# Patient Record
Sex: Female | Born: 1965 | Race: Black or African American | Hispanic: No | Smoking: Former smoker
Health system: Southern US, Community
[De-identification: ages and names within clinical notes are randomized; demographics above are authoritative.]

## PROBLEM LIST (undated history)

## (undated) DIAGNOSIS — F329 Major depressive disorder, single episode, unspecified: Secondary | ICD-10-CM

## (undated) DIAGNOSIS — N3941 Urge incontinence: Secondary | ICD-10-CM

## (undated) DIAGNOSIS — Z8679 Personal history of other diseases of the circulatory system: Secondary | ICD-10-CM

## (undated) DIAGNOSIS — W19XXXA Unspecified fall, initial encounter: Secondary | ICD-10-CM

## (undated) DIAGNOSIS — G629 Polyneuropathy, unspecified: Secondary | ICD-10-CM

## (undated) DIAGNOSIS — G5603 Carpal tunnel syndrome, bilateral upper limbs: Secondary | ICD-10-CM

## (undated) DIAGNOSIS — F1020 Alcohol dependence, uncomplicated: Secondary | ICD-10-CM

## (undated) DIAGNOSIS — F32A Depression, unspecified: Secondary | ICD-10-CM

## (undated) DIAGNOSIS — I10 Essential (primary) hypertension: Secondary | ICD-10-CM

## (undated) DIAGNOSIS — K759 Inflammatory liver disease, unspecified: Secondary | ICD-10-CM

## (undated) DIAGNOSIS — N6019 Diffuse cystic mastopathy of unspecified breast: Secondary | ICD-10-CM

## (undated) DIAGNOSIS — Z972 Presence of dental prosthetic device (complete) (partial): Secondary | ICD-10-CM

## (undated) DIAGNOSIS — I509 Heart failure, unspecified: Secondary | ICD-10-CM

## (undated) DIAGNOSIS — R0600 Dyspnea, unspecified: Secondary | ICD-10-CM

## (undated) DIAGNOSIS — N879 Dysplasia of cervix uteri, unspecified: Secondary | ICD-10-CM

## (undated) DIAGNOSIS — E119 Type 2 diabetes mellitus without complications: Secondary | ICD-10-CM

## (undated) DIAGNOSIS — M4800 Spinal stenosis, site unspecified: Secondary | ICD-10-CM

## (undated) DIAGNOSIS — M1990 Unspecified osteoarthritis, unspecified site: Secondary | ICD-10-CM

## (undated) DIAGNOSIS — R911 Solitary pulmonary nodule: Secondary | ICD-10-CM

## (undated) DIAGNOSIS — N189 Chronic kidney disease, unspecified: Secondary | ICD-10-CM

## (undated) DIAGNOSIS — G43909 Migraine, unspecified, not intractable, without status migrainosus: Secondary | ICD-10-CM

## (undated) DIAGNOSIS — C801 Malignant (primary) neoplasm, unspecified: Secondary | ICD-10-CM

## (undated) DIAGNOSIS — F419 Anxiety disorder, unspecified: Secondary | ICD-10-CM

## (undated) DIAGNOSIS — J189 Pneumonia, unspecified organism: Secondary | ICD-10-CM

## (undated) DIAGNOSIS — F319 Bipolar disorder, unspecified: Secondary | ICD-10-CM

## (undated) DIAGNOSIS — N183 Chronic kidney disease, stage 3 unspecified: Secondary | ICD-10-CM

## (undated) DIAGNOSIS — I517 Cardiomegaly: Secondary | ICD-10-CM

## (undated) DIAGNOSIS — K219 Gastro-esophageal reflux disease without esophagitis: Secondary | ICD-10-CM

## (undated) DIAGNOSIS — F1021 Alcohol dependence, in remission: Secondary | ICD-10-CM

## (undated) DIAGNOSIS — J9611 Chronic respiratory failure with hypoxia: Secondary | ICD-10-CM

## (undated) DIAGNOSIS — F411 Generalized anxiety disorder: Secondary | ICD-10-CM

## (undated) DIAGNOSIS — L989 Disorder of the skin and subcutaneous tissue, unspecified: Secondary | ICD-10-CM

## (undated) DIAGNOSIS — M6530 Trigger finger, unspecified finger: Secondary | ICD-10-CM

## (undated) DIAGNOSIS — A630 Anogenital (venereal) warts: Secondary | ICD-10-CM

## (undated) DIAGNOSIS — J449 Chronic obstructive pulmonary disease, unspecified: Secondary | ICD-10-CM

## (undated) DIAGNOSIS — G4730 Sleep apnea, unspecified: Secondary | ICD-10-CM

## (undated) DIAGNOSIS — F1910 Other psychoactive substance abuse, uncomplicated: Secondary | ICD-10-CM

## (undated) DIAGNOSIS — M62838 Other muscle spasm: Secondary | ICD-10-CM

## (undated) DIAGNOSIS — J45909 Unspecified asthma, uncomplicated: Secondary | ICD-10-CM

## (undated) DIAGNOSIS — E785 Hyperlipidemia, unspecified: Secondary | ICD-10-CM

## (undated) DIAGNOSIS — I471 Supraventricular tachycardia, unspecified: Secondary | ICD-10-CM

## (undated) HISTORY — PX: OTHER SURGICAL HISTORY: SHX169

## (undated) HISTORY — DX: Diffuse cystic mastopathy of unspecified breast: N60.19

## (undated) HISTORY — DX: Sleep apnea, unspecified: G47.30

## (undated) HISTORY — DX: Migraine, unspecified, not intractable, without status migrainosus: G43.909

## (undated) HISTORY — DX: Essential (primary) hypertension: I10

## (undated) HISTORY — PX: BREAST CYST EXCISION: SHX579

## (undated) HISTORY — DX: Disorder of the skin and subcutaneous tissue, unspecified: L98.9

## (undated) HISTORY — PX: TRIGGER FINGER RELEASE: SHX641

## (undated) HISTORY — PX: BREAST EXCISIONAL BIOPSY: SUR124

## (undated) HISTORY — PX: FRACTURE SURGERY: SHX138

## (undated) HISTORY — DX: Hyperlipidemia, unspecified: E78.5

## (undated) HISTORY — DX: Gastro-esophageal reflux disease without esophagitis: K21.9

## (undated) HISTORY — PX: CARPAL TUNNEL RELEASE: SHX101

## (undated) HISTORY — PX: MULTIPLE TOOTH EXTRACTIONS: SHX2053

## (undated) HISTORY — DX: Chronic obstructive pulmonary disease, unspecified: J44.9

## (undated) HISTORY — DX: Alcohol dependence, uncomplicated: F10.20

## (undated) HISTORY — DX: Anogenital (venereal) warts: A63.0

---
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
## 1991-01-29 DIAGNOSIS — C801 Malignant (primary) neoplasm, unspecified: Secondary | ICD-10-CM

## 1991-01-29 DIAGNOSIS — C539 Malignant neoplasm of cervix uteri, unspecified: Secondary | ICD-10-CM

## 1991-01-29 HISTORY — DX: Malignant neoplasm of cervix uteri, unspecified: C53.9

## 1991-01-29 HISTORY — DX: Malignant (primary) neoplasm, unspecified: C80.1

## 1997-07-05 ENCOUNTER — Encounter: Admission: RE | Admit: 1997-07-05 | Discharge: 1997-07-05 | Payer: Self-pay | Admitting: Family Medicine

## 1997-07-07 ENCOUNTER — Ambulatory Visit: Admission: RE | Admit: 1997-07-07 | Discharge: 1997-07-07 | Payer: Self-pay | Admitting: Family Medicine

## 1997-07-11 ENCOUNTER — Encounter: Admission: RE | Admit: 1997-07-11 | Discharge: 1997-07-11 | Payer: Self-pay | Admitting: Family Medicine

## 1997-07-15 ENCOUNTER — Ambulatory Visit (HOSPITAL_COMMUNITY): Admission: RE | Admit: 1997-07-15 | Discharge: 1997-07-15 | Payer: Self-pay | Admitting: *Deleted

## 1997-07-20 ENCOUNTER — Encounter: Admission: RE | Admit: 1997-07-20 | Discharge: 1997-07-20 | Payer: Self-pay | Admitting: Family Medicine

## 1997-08-23 ENCOUNTER — Encounter: Admission: RE | Admit: 1997-08-23 | Discharge: 1997-08-23 | Payer: Self-pay | Admitting: Sports Medicine

## 1997-10-11 ENCOUNTER — Emergency Department (HOSPITAL_COMMUNITY): Admission: EM | Admit: 1997-10-11 | Discharge: 1997-10-11 | Payer: Self-pay | Admitting: Emergency Medicine

## 1997-10-21 ENCOUNTER — Encounter: Admission: RE | Admit: 1997-10-21 | Discharge: 1997-10-21 | Payer: Self-pay | Admitting: Family Medicine

## 1997-10-27 ENCOUNTER — Encounter: Admission: RE | Admit: 1997-10-27 | Discharge: 1997-10-27 | Payer: Self-pay | Admitting: Family Medicine

## 1997-12-20 ENCOUNTER — Ambulatory Visit (HOSPITAL_COMMUNITY): Admission: RE | Admit: 1997-12-20 | Discharge: 1997-12-20 | Payer: Self-pay | Admitting: Specialist

## 1997-12-20 ENCOUNTER — Encounter: Payer: Self-pay | Admitting: Specialist

## 1998-01-18 ENCOUNTER — Encounter: Admission: RE | Admit: 1998-01-18 | Discharge: 1998-01-18 | Payer: Self-pay | Admitting: Sports Medicine

## 1998-01-23 ENCOUNTER — Encounter: Admission: RE | Admit: 1998-01-23 | Discharge: 1998-01-23 | Payer: Self-pay | Admitting: Family Medicine

## 1998-01-24 ENCOUNTER — Encounter: Admission: RE | Admit: 1998-01-24 | Discharge: 1998-01-24 | Payer: Self-pay | Admitting: Family Medicine

## 1998-01-31 ENCOUNTER — Encounter: Admission: RE | Admit: 1998-01-31 | Discharge: 1998-01-31 | Payer: Self-pay | Admitting: Sports Medicine

## 1998-02-08 ENCOUNTER — Ambulatory Visit (HOSPITAL_COMMUNITY): Admission: RE | Admit: 1998-02-08 | Discharge: 1998-02-08 | Payer: Self-pay | Admitting: Family Medicine

## 1998-02-16 ENCOUNTER — Encounter: Admission: RE | Admit: 1998-02-16 | Discharge: 1998-02-16 | Payer: Self-pay | Admitting: Family Medicine

## 1998-02-27 ENCOUNTER — Ambulatory Visit (HOSPITAL_COMMUNITY): Admission: RE | Admit: 1998-02-27 | Discharge: 1998-02-27 | Payer: Self-pay | Admitting: *Deleted

## 1998-03-15 ENCOUNTER — Encounter: Payer: Self-pay | Admitting: Specialist

## 1998-03-15 ENCOUNTER — Ambulatory Visit (HOSPITAL_COMMUNITY): Admission: RE | Admit: 1998-03-15 | Discharge: 1998-03-15 | Payer: Self-pay | Admitting: Specialist

## 1998-04-14 ENCOUNTER — Encounter: Payer: Self-pay | Admitting: Specialist

## 1998-04-18 ENCOUNTER — Inpatient Hospital Stay (HOSPITAL_COMMUNITY): Admission: RE | Admit: 1998-04-18 | Discharge: 1998-04-24 | Payer: Self-pay | Admitting: Specialist

## 1998-04-18 ENCOUNTER — Encounter: Payer: Self-pay | Admitting: Specialist

## 1998-04-19 ENCOUNTER — Encounter: Payer: Self-pay | Admitting: Specialist

## 1998-06-30 ENCOUNTER — Other Ambulatory Visit: Admission: RE | Admit: 1998-06-30 | Discharge: 1998-06-30 | Payer: Self-pay | Admitting: Family Medicine

## 1998-06-30 ENCOUNTER — Encounter: Admission: RE | Admit: 1998-06-30 | Discharge: 1998-06-30 | Payer: Self-pay | Admitting: Family Medicine

## 1998-07-17 ENCOUNTER — Encounter: Admission: RE | Admit: 1998-07-17 | Discharge: 1998-07-17 | Payer: Self-pay | Admitting: Family Medicine

## 1998-07-31 ENCOUNTER — Encounter: Admission: RE | Admit: 1998-07-31 | Discharge: 1998-07-31 | Payer: Self-pay | Admitting: Family Medicine

## 1998-08-07 ENCOUNTER — Encounter: Admission: RE | Admit: 1998-08-07 | Discharge: 1998-08-07 | Payer: Self-pay | Admitting: Family Medicine

## 1998-08-16 ENCOUNTER — Encounter: Admission: RE | Admit: 1998-08-16 | Discharge: 1998-08-16 | Payer: Self-pay | Admitting: Family Medicine

## 1998-09-07 ENCOUNTER — Encounter: Admission: RE | Admit: 1998-09-07 | Discharge: 1998-09-07 | Payer: Self-pay | Admitting: Family Medicine

## 1998-10-06 ENCOUNTER — Ambulatory Visit (HOSPITAL_COMMUNITY): Admission: RE | Admit: 1998-10-06 | Discharge: 1998-10-06 | Payer: Self-pay | Admitting: Specialist

## 1998-10-06 ENCOUNTER — Encounter: Payer: Self-pay | Admitting: Specialist

## 1998-10-10 ENCOUNTER — Encounter: Admission: RE | Admit: 1998-10-10 | Discharge: 1998-10-10 | Payer: Self-pay | Admitting: Family Medicine

## 1998-11-08 ENCOUNTER — Encounter: Admission: RE | Admit: 1998-11-08 | Discharge: 1998-11-08 | Payer: Self-pay | Admitting: Family Medicine

## 1999-01-02 ENCOUNTER — Encounter: Admission: RE | Admit: 1999-01-02 | Discharge: 1999-01-02 | Payer: Self-pay | Admitting: Family Medicine

## 1999-01-18 ENCOUNTER — Encounter: Admission: RE | Admit: 1999-01-18 | Discharge: 1999-01-18 | Payer: Self-pay | Admitting: Family Medicine

## 1999-02-07 ENCOUNTER — Encounter: Admission: RE | Admit: 1999-02-07 | Discharge: 1999-02-07 | Payer: Self-pay | Admitting: Family Medicine

## 1999-02-09 ENCOUNTER — Encounter: Admission: RE | Admit: 1999-02-09 | Discharge: 1999-02-09 | Payer: Self-pay | Admitting: Family Medicine

## 1999-02-20 ENCOUNTER — Encounter: Admission: RE | Admit: 1999-02-20 | Discharge: 1999-05-21 | Payer: Self-pay | Admitting: *Deleted

## 1999-05-08 ENCOUNTER — Encounter: Admission: RE | Admit: 1999-05-08 | Discharge: 1999-05-08 | Payer: Self-pay | Admitting: Family Medicine

## 1999-06-13 ENCOUNTER — Encounter: Payer: Self-pay | Admitting: Specialist

## 1999-06-13 ENCOUNTER — Ambulatory Visit (HOSPITAL_COMMUNITY): Admission: RE | Admit: 1999-06-13 | Discharge: 1999-06-13 | Payer: Self-pay | Admitting: Specialist

## 1999-07-04 ENCOUNTER — Other Ambulatory Visit: Admission: RE | Admit: 1999-07-04 | Discharge: 1999-07-04 | Payer: Self-pay | Admitting: Family Medicine

## 1999-07-04 ENCOUNTER — Encounter: Admission: RE | Admit: 1999-07-04 | Discharge: 1999-07-04 | Payer: Self-pay | Admitting: Family Medicine

## 1999-10-08 ENCOUNTER — Encounter: Admission: RE | Admit: 1999-10-08 | Discharge: 1999-10-08 | Payer: Self-pay | Admitting: Family Medicine

## 1999-10-15 ENCOUNTER — Encounter: Admission: RE | Admit: 1999-10-15 | Discharge: 1999-10-15 | Payer: Self-pay | Admitting: Family Medicine

## 1999-11-09 ENCOUNTER — Encounter: Admission: RE | Admit: 1999-11-09 | Discharge: 1999-11-09 | Payer: Self-pay | Admitting: Family Medicine

## 1999-11-12 ENCOUNTER — Encounter: Admission: RE | Admit: 1999-11-12 | Discharge: 1999-11-12 | Payer: Self-pay | Admitting: Family Medicine

## 1999-11-15 ENCOUNTER — Encounter: Admission: RE | Admit: 1999-11-15 | Discharge: 1999-11-15 | Payer: Self-pay | Admitting: *Deleted

## 1999-11-15 ENCOUNTER — Encounter: Payer: Self-pay | Admitting: *Deleted

## 2000-01-07 ENCOUNTER — Encounter: Admission: RE | Admit: 2000-01-07 | Discharge: 2000-01-07 | Payer: Self-pay | Admitting: Family Medicine

## 2000-01-11 ENCOUNTER — Encounter: Admission: RE | Admit: 2000-01-11 | Discharge: 2000-01-11 | Payer: Self-pay | Admitting: Family Medicine

## 2000-01-14 ENCOUNTER — Encounter: Admission: RE | Admit: 2000-01-14 | Discharge: 2000-01-14 | Payer: Self-pay | Admitting: Family Medicine

## 2000-01-15 ENCOUNTER — Encounter: Admission: RE | Admit: 2000-01-15 | Discharge: 2000-01-15 | Payer: Self-pay | Admitting: *Deleted

## 2000-01-15 ENCOUNTER — Encounter: Payer: Self-pay | Admitting: *Deleted

## 2000-01-23 ENCOUNTER — Encounter: Admission: RE | Admit: 2000-01-23 | Discharge: 2000-01-23 | Payer: Self-pay | Admitting: *Deleted

## 2000-01-23 ENCOUNTER — Encounter: Admission: RE | Admit: 2000-01-23 | Discharge: 2000-01-23 | Payer: Self-pay | Admitting: Internal Medicine

## 2000-01-23 ENCOUNTER — Encounter: Payer: Self-pay | Admitting: *Deleted

## 2000-02-06 ENCOUNTER — Encounter: Payer: Self-pay | Admitting: *Deleted

## 2000-02-06 ENCOUNTER — Encounter: Admission: RE | Admit: 2000-02-06 | Discharge: 2000-02-06 | Payer: Self-pay | Admitting: *Deleted

## 2000-03-13 ENCOUNTER — Encounter: Payer: Self-pay | Admitting: Specialist

## 2000-03-13 ENCOUNTER — Encounter: Admission: RE | Admit: 2000-03-13 | Discharge: 2000-03-13 | Payer: Self-pay | Admitting: Specialist

## 2000-05-14 ENCOUNTER — Encounter: Admission: RE | Admit: 2000-05-14 | Discharge: 2000-05-14 | Payer: Self-pay | Admitting: Family Medicine

## 2000-06-02 ENCOUNTER — Encounter: Admission: RE | Admit: 2000-06-02 | Discharge: 2000-06-02 | Payer: Self-pay | Admitting: Family Medicine

## 2000-06-04 ENCOUNTER — Encounter: Admission: RE | Admit: 2000-06-04 | Discharge: 2000-06-04 | Payer: Self-pay | Admitting: Family Medicine

## 2000-06-05 ENCOUNTER — Encounter: Payer: Self-pay | Admitting: Sports Medicine

## 2000-06-05 ENCOUNTER — Encounter: Admission: RE | Admit: 2000-06-05 | Discharge: 2000-06-05 | Payer: Self-pay | Admitting: Sports Medicine

## 2000-06-06 ENCOUNTER — Encounter: Admission: RE | Admit: 2000-06-06 | Discharge: 2000-06-06 | Payer: Self-pay | Admitting: Family Medicine

## 2000-06-09 ENCOUNTER — Encounter: Admission: RE | Admit: 2000-06-09 | Discharge: 2000-06-09 | Payer: Self-pay | Admitting: *Deleted

## 2000-06-09 ENCOUNTER — Encounter: Payer: Self-pay | Admitting: *Deleted

## 2000-06-10 ENCOUNTER — Encounter: Admission: RE | Admit: 2000-06-10 | Discharge: 2000-06-10 | Payer: Self-pay | Admitting: Family Medicine

## 2000-06-16 ENCOUNTER — Encounter: Payer: Self-pay | Admitting: Specialist

## 2000-06-16 ENCOUNTER — Encounter: Admission: RE | Admit: 2000-06-16 | Discharge: 2000-06-16 | Payer: Self-pay | Admitting: Specialist

## 2000-06-23 ENCOUNTER — Encounter: Admission: RE | Admit: 2000-06-23 | Discharge: 2000-06-23 | Payer: Self-pay | Admitting: *Deleted

## 2000-06-23 ENCOUNTER — Encounter: Payer: Self-pay | Admitting: *Deleted

## 2000-06-24 ENCOUNTER — Encounter: Admission: RE | Admit: 2000-06-24 | Discharge: 2000-06-24 | Payer: Self-pay | Admitting: Sports Medicine

## 2000-07-16 ENCOUNTER — Encounter: Admission: RE | Admit: 2000-07-16 | Discharge: 2000-07-16 | Payer: Self-pay | Admitting: Family Medicine

## 2000-07-24 ENCOUNTER — Encounter: Admission: RE | Admit: 2000-07-24 | Discharge: 2000-07-24 | Payer: Self-pay | Admitting: Family Medicine

## 2000-08-20 ENCOUNTER — Encounter: Admission: RE | Admit: 2000-08-20 | Discharge: 2000-08-20 | Payer: Self-pay | Admitting: Family Medicine

## 2000-11-19 ENCOUNTER — Encounter: Admission: RE | Admit: 2000-11-19 | Discharge: 2000-11-19 | Payer: Self-pay | Admitting: Family Medicine

## 2000-12-08 ENCOUNTER — Encounter: Admission: RE | Admit: 2000-12-08 | Discharge: 2000-12-08 | Payer: Self-pay | Admitting: Family Medicine

## 2000-12-10 ENCOUNTER — Encounter: Admission: RE | Admit: 2000-12-10 | Discharge: 2000-12-10 | Payer: Self-pay | Admitting: *Deleted

## 2000-12-26 ENCOUNTER — Encounter: Payer: Self-pay | Admitting: General Surgery

## 2000-12-26 ENCOUNTER — Encounter: Admission: RE | Admit: 2000-12-26 | Discharge: 2000-12-26 | Payer: Self-pay | Admitting: General Surgery

## 2000-12-29 ENCOUNTER — Encounter (INDEPENDENT_AMBULATORY_CARE_PROVIDER_SITE_OTHER): Payer: Self-pay | Admitting: *Deleted

## 2000-12-29 ENCOUNTER — Ambulatory Visit (HOSPITAL_BASED_OUTPATIENT_CLINIC_OR_DEPARTMENT_OTHER): Admission: RE | Admit: 2000-12-29 | Discharge: 2000-12-29 | Payer: Self-pay | Admitting: Orthopaedic Surgery

## 2001-02-18 ENCOUNTER — Encounter: Admission: RE | Admit: 2001-02-18 | Discharge: 2001-02-18 | Payer: Self-pay | Admitting: Family Medicine

## 2001-02-20 ENCOUNTER — Encounter: Payer: Self-pay | Admitting: *Deleted

## 2001-02-20 ENCOUNTER — Encounter: Admission: RE | Admit: 2001-02-20 | Discharge: 2001-02-20 | Payer: Self-pay | Admitting: *Deleted

## 2001-02-26 ENCOUNTER — Encounter: Admission: RE | Admit: 2001-02-26 | Discharge: 2001-02-26 | Payer: Self-pay | Admitting: Family Medicine

## 2001-05-19 ENCOUNTER — Encounter: Admission: RE | Admit: 2001-05-19 | Discharge: 2001-05-19 | Payer: Self-pay | Admitting: Family Medicine

## 2001-05-26 ENCOUNTER — Encounter: Admission: RE | Admit: 2001-05-26 | Discharge: 2001-05-26 | Payer: Self-pay | Admitting: Pediatrics

## 2001-06-11 ENCOUNTER — Encounter: Admission: RE | Admit: 2001-06-11 | Discharge: 2001-06-11 | Payer: Self-pay | Admitting: Family Medicine

## 2001-06-23 ENCOUNTER — Encounter: Admission: RE | Admit: 2001-06-23 | Discharge: 2001-06-23 | Payer: Self-pay | Admitting: Family Medicine

## 2001-06-25 ENCOUNTER — Encounter: Admission: RE | Admit: 2001-06-25 | Discharge: 2001-06-25 | Payer: Self-pay | Admitting: Family Medicine

## 2001-07-08 ENCOUNTER — Encounter: Admission: RE | Admit: 2001-07-08 | Discharge: 2001-07-08 | Payer: Self-pay | Admitting: Family Medicine

## 2001-07-08 ENCOUNTER — Other Ambulatory Visit: Admission: RE | Admit: 2001-07-08 | Discharge: 2001-07-08 | Payer: Self-pay | Admitting: Family Medicine

## 2001-07-13 ENCOUNTER — Ambulatory Visit (HOSPITAL_COMMUNITY): Admission: RE | Admit: 2001-07-13 | Discharge: 2001-07-13 | Payer: Self-pay | Admitting: *Deleted

## 2001-07-20 ENCOUNTER — Encounter: Admission: RE | Admit: 2001-07-20 | Discharge: 2001-07-20 | Payer: Self-pay | Admitting: Family Medicine

## 2001-09-10 ENCOUNTER — Encounter: Admission: RE | Admit: 2001-09-10 | Discharge: 2001-09-10 | Payer: Self-pay | Admitting: Family Medicine

## 2001-09-17 ENCOUNTER — Encounter: Admission: RE | Admit: 2001-09-17 | Discharge: 2001-09-17 | Payer: Self-pay | Admitting: Family Medicine

## 2001-10-19 ENCOUNTER — Encounter: Admission: RE | Admit: 2001-10-19 | Discharge: 2001-10-19 | Payer: Self-pay | Admitting: Family Medicine

## 2001-10-20 ENCOUNTER — Inpatient Hospital Stay (HOSPITAL_COMMUNITY): Admission: EM | Admit: 2001-10-20 | Discharge: 2001-10-24 | Payer: Self-pay | Admitting: Psychiatry

## 2001-11-04 ENCOUNTER — Encounter: Admission: RE | Admit: 2001-11-04 | Discharge: 2001-11-04 | Payer: Self-pay | Admitting: *Deleted

## 2001-11-18 ENCOUNTER — Encounter: Admission: RE | Admit: 2001-11-18 | Discharge: 2001-11-18 | Payer: Self-pay | Admitting: Sports Medicine

## 2001-11-20 ENCOUNTER — Encounter: Admission: RE | Admit: 2001-11-20 | Discharge: 2001-11-20 | Payer: Self-pay | Admitting: Family Medicine

## 2001-11-20 ENCOUNTER — Encounter: Payer: Self-pay | Admitting: Family Medicine

## 2001-11-30 ENCOUNTER — Encounter (INDEPENDENT_AMBULATORY_CARE_PROVIDER_SITE_OTHER): Payer: Self-pay | Admitting: *Deleted

## 2001-11-30 ENCOUNTER — Ambulatory Visit (HOSPITAL_BASED_OUTPATIENT_CLINIC_OR_DEPARTMENT_OTHER): Admission: RE | Admit: 2001-11-30 | Discharge: 2001-11-30 | Payer: Self-pay | Admitting: General Surgery

## 2001-12-17 ENCOUNTER — Encounter: Admission: RE | Admit: 2001-12-17 | Discharge: 2001-12-17 | Payer: Self-pay | Admitting: Family Medicine

## 2001-12-21 ENCOUNTER — Encounter: Admission: RE | Admit: 2001-12-21 | Discharge: 2001-12-21 | Payer: Self-pay | Admitting: Sports Medicine

## 2002-01-27 ENCOUNTER — Encounter: Admission: RE | Admit: 2002-01-27 | Discharge: 2002-01-27 | Payer: Self-pay | Admitting: *Deleted

## 2002-02-09 ENCOUNTER — Encounter: Admission: RE | Admit: 2002-02-09 | Discharge: 2002-02-09 | Payer: Self-pay | Admitting: Specialist

## 2002-02-09 ENCOUNTER — Encounter: Payer: Self-pay | Admitting: Specialist

## 2002-02-12 ENCOUNTER — Encounter: Admission: RE | Admit: 2002-02-12 | Discharge: 2002-02-12 | Payer: Self-pay | Admitting: Family Medicine

## 2002-02-15 ENCOUNTER — Encounter: Admission: RE | Admit: 2002-02-15 | Discharge: 2002-02-15 | Payer: Self-pay | Admitting: Family Medicine

## 2002-03-16 ENCOUNTER — Encounter: Admission: RE | Admit: 2002-03-16 | Discharge: 2002-03-16 | Payer: Self-pay | Admitting: General Surgery

## 2002-03-16 ENCOUNTER — Encounter: Payer: Self-pay | Admitting: General Surgery

## 2002-03-16 ENCOUNTER — Encounter: Admission: RE | Admit: 2002-03-16 | Discharge: 2002-03-16 | Payer: Self-pay | Admitting: Sports Medicine

## 2002-03-17 ENCOUNTER — Encounter (INDEPENDENT_AMBULATORY_CARE_PROVIDER_SITE_OTHER): Payer: Self-pay | Admitting: Specialist

## 2002-03-17 ENCOUNTER — Ambulatory Visit (HOSPITAL_BASED_OUTPATIENT_CLINIC_OR_DEPARTMENT_OTHER): Admission: RE | Admit: 2002-03-17 | Discharge: 2002-03-17 | Payer: Self-pay | Admitting: General Surgery

## 2002-03-31 ENCOUNTER — Encounter: Admission: RE | Admit: 2002-03-31 | Discharge: 2002-03-31 | Payer: Self-pay | Admitting: Family Medicine

## 2002-04-05 ENCOUNTER — Encounter: Admission: RE | Admit: 2002-04-05 | Discharge: 2002-04-05 | Payer: Self-pay | Admitting: Family Medicine

## 2002-04-21 ENCOUNTER — Encounter: Admission: RE | Admit: 2002-04-21 | Discharge: 2002-04-21 | Payer: Self-pay | Admitting: Family Medicine

## 2002-05-03 ENCOUNTER — Encounter (INDEPENDENT_AMBULATORY_CARE_PROVIDER_SITE_OTHER): Payer: Self-pay | Admitting: *Deleted

## 2002-05-03 ENCOUNTER — Ambulatory Visit (HOSPITAL_BASED_OUTPATIENT_CLINIC_OR_DEPARTMENT_OTHER): Admission: RE | Admit: 2002-05-03 | Discharge: 2002-05-03 | Payer: Self-pay | Admitting: General Surgery

## 2002-05-03 HISTORY — PX: INCISION AND DRAINAGE ABSCESS: SHX5864

## 2002-05-27 ENCOUNTER — Encounter: Admission: RE | Admit: 2002-05-27 | Discharge: 2002-05-27 | Payer: Self-pay | Admitting: Family Medicine

## 2002-05-28 ENCOUNTER — Encounter: Payer: Self-pay | Admitting: General Surgery

## 2002-05-28 ENCOUNTER — Encounter: Admission: RE | Admit: 2002-05-28 | Discharge: 2002-05-28 | Payer: Self-pay | Admitting: General Surgery

## 2002-05-28 ENCOUNTER — Ambulatory Visit: Admission: RE | Admit: 2002-05-28 | Discharge: 2002-05-28 | Payer: Self-pay | Admitting: Family Medicine

## 2002-06-09 ENCOUNTER — Encounter: Admission: RE | Admit: 2002-06-09 | Discharge: 2002-06-09 | Payer: Self-pay | Admitting: Family Medicine

## 2002-06-11 ENCOUNTER — Encounter: Payer: Self-pay | Admitting: Sports Medicine

## 2002-06-11 ENCOUNTER — Encounter: Admission: RE | Admit: 2002-06-11 | Discharge: 2002-06-11 | Payer: Self-pay | Admitting: Sports Medicine

## 2002-06-23 ENCOUNTER — Encounter: Admission: RE | Admit: 2002-06-23 | Discharge: 2002-06-23 | Payer: Self-pay | Admitting: Family Medicine

## 2002-07-01 ENCOUNTER — Encounter: Payer: Self-pay | Admitting: Family Medicine

## 2002-07-01 ENCOUNTER — Ambulatory Visit (HOSPITAL_COMMUNITY): Admission: RE | Admit: 2002-07-01 | Discharge: 2002-07-01 | Payer: Self-pay | Admitting: Family Medicine

## 2002-07-09 ENCOUNTER — Encounter: Admission: RE | Admit: 2002-07-09 | Discharge: 2002-07-09 | Payer: Self-pay | Admitting: Family Medicine

## 2002-07-13 ENCOUNTER — Encounter: Admission: RE | Admit: 2002-07-13 | Discharge: 2002-07-13 | Payer: Self-pay | Admitting: Sports Medicine

## 2002-07-13 ENCOUNTER — Encounter: Payer: Self-pay | Admitting: Sports Medicine

## 2002-07-16 ENCOUNTER — Encounter: Payer: Self-pay | Admitting: Family Medicine

## 2002-07-16 ENCOUNTER — Ambulatory Visit (HOSPITAL_COMMUNITY): Admission: RE | Admit: 2002-07-16 | Discharge: 2002-07-16 | Payer: Self-pay | Admitting: Family Medicine

## 2002-07-29 ENCOUNTER — Encounter: Admission: RE | Admit: 2002-07-29 | Discharge: 2002-07-29 | Payer: Self-pay | Admitting: Family Medicine

## 2002-08-18 ENCOUNTER — Encounter: Admission: RE | Admit: 2002-08-18 | Discharge: 2002-08-18 | Payer: Self-pay | Admitting: Family Medicine

## 2002-08-31 ENCOUNTER — Ambulatory Visit (HOSPITAL_COMMUNITY): Admission: RE | Admit: 2002-08-31 | Discharge: 2002-08-31 | Payer: Self-pay | Admitting: Specialist

## 2002-09-02 ENCOUNTER — Encounter: Admission: RE | Admit: 2002-09-02 | Discharge: 2002-09-02 | Payer: Self-pay | Admitting: Family Medicine

## 2002-09-08 ENCOUNTER — Encounter: Admission: RE | Admit: 2002-09-08 | Discharge: 2002-09-08 | Payer: Self-pay | Admitting: Family Medicine

## 2002-09-16 ENCOUNTER — Encounter: Admission: RE | Admit: 2002-09-16 | Discharge: 2002-09-16 | Payer: Self-pay | Admitting: Family Medicine

## 2002-09-17 ENCOUNTER — Encounter: Admission: RE | Admit: 2002-09-17 | Discharge: 2002-09-17 | Payer: Self-pay | Admitting: Sports Medicine

## 2002-10-01 ENCOUNTER — Encounter: Admission: RE | Admit: 2002-10-01 | Discharge: 2002-10-01 | Payer: Self-pay | Admitting: Family Medicine

## 2002-10-15 ENCOUNTER — Encounter: Admission: RE | Admit: 2002-10-15 | Discharge: 2002-10-15 | Payer: Self-pay | Admitting: Sports Medicine

## 2002-10-19 ENCOUNTER — Encounter: Payer: Self-pay | Admitting: Sports Medicine

## 2002-10-19 ENCOUNTER — Encounter: Admission: RE | Admit: 2002-10-19 | Discharge: 2002-10-19 | Payer: Self-pay | Admitting: Sports Medicine

## 2002-10-20 ENCOUNTER — Encounter: Payer: Self-pay | Admitting: Family Medicine

## 2002-10-20 ENCOUNTER — Ambulatory Visit (HOSPITAL_COMMUNITY): Admission: RE | Admit: 2002-10-20 | Discharge: 2002-10-20 | Payer: Self-pay | Admitting: Family Medicine

## 2002-10-21 ENCOUNTER — Encounter: Admission: RE | Admit: 2002-10-21 | Discharge: 2002-10-21 | Payer: Self-pay | Admitting: Family Medicine

## 2002-10-28 ENCOUNTER — Encounter: Admission: RE | Admit: 2002-10-28 | Discharge: 2002-10-28 | Payer: Self-pay | Admitting: Sports Medicine

## 2002-11-09 ENCOUNTER — Encounter: Admission: RE | Admit: 2002-11-09 | Discharge: 2002-11-09 | Payer: Self-pay | Admitting: Family Medicine

## 2002-11-22 ENCOUNTER — Encounter: Admission: RE | Admit: 2002-11-22 | Discharge: 2002-11-22 | Payer: Self-pay | Admitting: Family Medicine

## 2002-11-24 ENCOUNTER — Encounter: Admission: RE | Admit: 2002-11-24 | Discharge: 2002-11-24 | Payer: Self-pay | Admitting: Family Medicine

## 2002-12-29 ENCOUNTER — Encounter: Admission: RE | Admit: 2002-12-29 | Discharge: 2002-12-29 | Payer: Self-pay | Admitting: Family Medicine

## 2003-01-31 ENCOUNTER — Encounter: Admission: RE | Admit: 2003-01-31 | Discharge: 2003-01-31 | Payer: Self-pay | Admitting: Family Medicine

## 2003-02-10 ENCOUNTER — Encounter: Admission: RE | Admit: 2003-02-10 | Discharge: 2003-02-10 | Payer: Self-pay | Admitting: Sports Medicine

## 2003-02-24 ENCOUNTER — Encounter: Admission: RE | Admit: 2003-02-24 | Discharge: 2003-02-24 | Payer: Self-pay | Admitting: Family Medicine

## 2003-03-10 ENCOUNTER — Encounter: Admission: RE | Admit: 2003-03-10 | Discharge: 2003-03-10 | Payer: Self-pay | Admitting: Sports Medicine

## 2003-03-31 ENCOUNTER — Encounter: Admission: RE | Admit: 2003-03-31 | Discharge: 2003-03-31 | Payer: Self-pay | Admitting: Family Medicine

## 2003-04-25 ENCOUNTER — Encounter: Admission: RE | Admit: 2003-04-25 | Discharge: 2003-04-25 | Payer: Self-pay | Admitting: Family Medicine

## 2003-05-05 ENCOUNTER — Encounter: Admission: RE | Admit: 2003-05-05 | Discharge: 2003-05-05 | Payer: Self-pay | Admitting: Family Medicine

## 2003-07-04 ENCOUNTER — Ambulatory Visit (HOSPITAL_COMMUNITY): Admission: RE | Admit: 2003-07-04 | Discharge: 2003-07-04 | Payer: Self-pay | Admitting: Specialist

## 2003-07-04 ENCOUNTER — Ambulatory Visit (HOSPITAL_BASED_OUTPATIENT_CLINIC_OR_DEPARTMENT_OTHER): Admission: RE | Admit: 2003-07-04 | Discharge: 2003-07-04 | Payer: Self-pay | Admitting: Specialist

## 2003-07-21 ENCOUNTER — Inpatient Hospital Stay (HOSPITAL_COMMUNITY): Admission: RE | Admit: 2003-07-21 | Discharge: 2003-07-28 | Payer: Self-pay | Admitting: Psychiatry

## 2003-09-22 ENCOUNTER — Ambulatory Visit: Payer: Self-pay | Admitting: Sports Medicine

## 2003-09-22 ENCOUNTER — Encounter: Admission: RE | Admit: 2003-09-22 | Discharge: 2003-09-22 | Payer: Self-pay | Admitting: Family Medicine

## 2004-02-02 ENCOUNTER — Ambulatory Visit: Payer: Self-pay | Admitting: Family Medicine

## 2004-02-16 ENCOUNTER — Encounter: Admission: RE | Admit: 2004-02-16 | Discharge: 2004-02-16 | Payer: Self-pay | Admitting: Sports Medicine

## 2004-02-16 ENCOUNTER — Ambulatory Visit: Payer: Self-pay | Admitting: Family Medicine

## 2004-03-05 ENCOUNTER — Ambulatory Visit: Payer: Self-pay | Admitting: Family Medicine

## 2004-03-12 ENCOUNTER — Ambulatory Visit: Payer: Self-pay | Admitting: Family Medicine

## 2004-03-22 ENCOUNTER — Ambulatory Visit (HOSPITAL_COMMUNITY): Admission: RE | Admit: 2004-03-22 | Discharge: 2004-03-22 | Payer: Self-pay | Admitting: Anesthesiology

## 2004-03-26 ENCOUNTER — Ambulatory Visit: Payer: Self-pay | Admitting: Family Medicine

## 2004-04-02 ENCOUNTER — Ambulatory Visit: Payer: Self-pay | Admitting: Family Medicine

## 2004-04-23 ENCOUNTER — Ambulatory Visit: Payer: Self-pay | Admitting: Sports Medicine

## 2004-05-30 ENCOUNTER — Ambulatory Visit: Payer: Self-pay | Admitting: Family Medicine

## 2004-06-08 ENCOUNTER — Ambulatory Visit: Payer: Self-pay | Admitting: Family Medicine

## 2004-10-13 ENCOUNTER — Emergency Department (HOSPITAL_COMMUNITY): Admission: EM | Admit: 2004-10-13 | Discharge: 2004-10-14 | Payer: Self-pay | Admitting: Emergency Medicine

## 2004-10-16 ENCOUNTER — Ambulatory Visit: Payer: Self-pay | Admitting: Sports Medicine

## 2004-11-19 ENCOUNTER — Ambulatory Visit: Payer: Self-pay | Admitting: Family Medicine

## 2005-01-18 ENCOUNTER — Encounter: Admission: RE | Admit: 2005-01-18 | Discharge: 2005-01-18 | Payer: Self-pay | Admitting: Specialist

## 2005-02-12 ENCOUNTER — Ambulatory Visit: Payer: Self-pay | Admitting: Family Medicine

## 2005-02-19 ENCOUNTER — Encounter: Admission: RE | Admit: 2005-02-19 | Discharge: 2005-02-19 | Payer: Self-pay | Admitting: Specialist

## 2005-03-15 ENCOUNTER — Ambulatory Visit: Payer: Self-pay | Admitting: Sports Medicine

## 2005-03-28 ENCOUNTER — Encounter: Admission: RE | Admit: 2005-03-28 | Discharge: 2005-03-28 | Payer: Self-pay | Admitting: Sports Medicine

## 2005-04-04 ENCOUNTER — Inpatient Hospital Stay (HOSPITAL_COMMUNITY): Admission: RE | Admit: 2005-04-04 | Discharge: 2005-04-09 | Payer: Self-pay | Admitting: Specialist

## 2005-04-04 HISTORY — PX: POSTERIOR LUMBAR FUSION: SHX6036

## 2005-07-04 ENCOUNTER — Encounter (INDEPENDENT_AMBULATORY_CARE_PROVIDER_SITE_OTHER): Payer: Self-pay | Admitting: *Deleted

## 2005-07-04 LAB — CONVERTED CEMR LAB

## 2005-07-16 ENCOUNTER — Ambulatory Visit: Payer: Self-pay | Admitting: Sports Medicine

## 2005-08-21 ENCOUNTER — Ambulatory Visit: Payer: Self-pay | Admitting: Family Medicine

## 2005-08-22 ENCOUNTER — Encounter: Admission: RE | Admit: 2005-08-22 | Discharge: 2005-08-22 | Payer: Self-pay | Admitting: Sports Medicine

## 2005-10-17 ENCOUNTER — Ambulatory Visit: Payer: Self-pay | Admitting: Sports Medicine

## 2005-10-29 ENCOUNTER — Ambulatory Visit: Payer: Self-pay | Admitting: Family Medicine

## 2005-11-04 ENCOUNTER — Ambulatory Visit: Payer: Self-pay | Admitting: Family Medicine

## 2005-11-04 ENCOUNTER — Ambulatory Visit (HOSPITAL_COMMUNITY): Admission: RE | Admit: 2005-11-04 | Discharge: 2005-11-04 | Payer: Self-pay | Admitting: Family Medicine

## 2005-11-07 ENCOUNTER — Encounter: Admission: RE | Admit: 2005-11-07 | Discharge: 2005-11-07 | Payer: Self-pay | Admitting: Sports Medicine

## 2005-11-08 ENCOUNTER — Ambulatory Visit (HOSPITAL_COMMUNITY): Admission: RE | Admit: 2005-11-08 | Discharge: 2005-11-08 | Payer: Self-pay | Admitting: Family Medicine

## 2005-11-12 ENCOUNTER — Ambulatory Visit: Payer: Self-pay | Admitting: Family Medicine

## 2005-11-14 ENCOUNTER — Ambulatory Visit: Payer: Self-pay | Admitting: Family Medicine

## 2005-11-26 ENCOUNTER — Ambulatory Visit: Payer: Self-pay | Admitting: Sports Medicine

## 2005-12-24 ENCOUNTER — Ambulatory Visit: Payer: Self-pay | Admitting: Family Medicine

## 2006-01-02 ENCOUNTER — Ambulatory Visit: Payer: Self-pay | Admitting: Sports Medicine

## 2006-01-07 ENCOUNTER — Ambulatory Visit: Payer: Self-pay | Admitting: Sports Medicine

## 2006-01-14 ENCOUNTER — Encounter: Admission: RE | Admit: 2006-01-14 | Discharge: 2006-01-14 | Payer: Self-pay | Admitting: Specialist

## 2006-01-31 ENCOUNTER — Ambulatory Visit: Payer: Self-pay | Admitting: Family Medicine

## 2006-03-04 ENCOUNTER — Inpatient Hospital Stay (HOSPITAL_COMMUNITY): Admission: RE | Admit: 2006-03-04 | Discharge: 2006-03-09 | Payer: Self-pay | Admitting: Specialist

## 2006-03-04 HISTORY — PX: POSTERIOR LUMBAR FUSION: SHX6036

## 2006-03-27 DIAGNOSIS — G834 Cauda equina syndrome: Secondary | ICD-10-CM | POA: Insufficient documentation

## 2006-03-27 DIAGNOSIS — E662 Morbid (severe) obesity with alveolar hypoventilation: Secondary | ICD-10-CM | POA: Insufficient documentation

## 2006-03-27 DIAGNOSIS — E785 Hyperlipidemia, unspecified: Secondary | ICD-10-CM | POA: Insufficient documentation

## 2006-03-27 DIAGNOSIS — D509 Iron deficiency anemia, unspecified: Secondary | ICD-10-CM | POA: Insufficient documentation

## 2006-03-27 DIAGNOSIS — F418 Other specified anxiety disorders: Secondary | ICD-10-CM | POA: Insufficient documentation

## 2006-03-27 DIAGNOSIS — Z87891 Personal history of nicotine dependence: Secondary | ICD-10-CM | POA: Insufficient documentation

## 2006-03-27 DIAGNOSIS — R0600 Dyspnea, unspecified: Secondary | ICD-10-CM | POA: Insufficient documentation

## 2006-03-27 HISTORY — DX: Morbid (severe) obesity due to excess calories: E66.01

## 2006-03-28 ENCOUNTER — Encounter (INDEPENDENT_AMBULATORY_CARE_PROVIDER_SITE_OTHER): Payer: Self-pay | Admitting: *Deleted

## 2006-04-08 ENCOUNTER — Telehealth (INDEPENDENT_AMBULATORY_CARE_PROVIDER_SITE_OTHER): Payer: Self-pay | Admitting: *Deleted

## 2006-04-09 ENCOUNTER — Telehealth (INDEPENDENT_AMBULATORY_CARE_PROVIDER_SITE_OTHER): Payer: Self-pay | Admitting: Family Medicine

## 2006-04-11 ENCOUNTER — Encounter (INDEPENDENT_AMBULATORY_CARE_PROVIDER_SITE_OTHER): Payer: Self-pay | Admitting: Family Medicine

## 2006-04-11 ENCOUNTER — Ambulatory Visit: Payer: Self-pay | Admitting: Family Medicine

## 2006-04-11 LAB — CONVERTED CEMR LAB
HCV Ab: NEGATIVE
Hep A IgM: NEGATIVE
Hep B C IgM: NEGATIVE
Hepatitis B Surface Ag: POSITIVE — AB

## 2006-04-14 ENCOUNTER — Telehealth (INDEPENDENT_AMBULATORY_CARE_PROVIDER_SITE_OTHER): Payer: Self-pay | Admitting: Family Medicine

## 2006-04-14 ENCOUNTER — Encounter (INDEPENDENT_AMBULATORY_CARE_PROVIDER_SITE_OTHER): Payer: Self-pay | Admitting: Family Medicine

## 2006-04-14 ENCOUNTER — Telehealth: Payer: Self-pay | Admitting: *Deleted

## 2006-04-16 ENCOUNTER — Ambulatory Visit: Payer: Self-pay | Admitting: Family Medicine

## 2006-04-16 LAB — CONVERTED CEMR LAB: H Pylori IgG: NEGATIVE

## 2006-04-17 ENCOUNTER — Telehealth: Payer: Self-pay | Admitting: *Deleted

## 2006-04-23 ENCOUNTER — Encounter: Admission: RE | Admit: 2006-04-23 | Discharge: 2006-04-23 | Payer: Self-pay | Admitting: Sports Medicine

## 2006-05-01 ENCOUNTER — Ambulatory Visit: Payer: Self-pay | Admitting: Sports Medicine

## 2006-05-15 ENCOUNTER — Telehealth: Payer: Self-pay | Admitting: *Deleted

## 2006-05-26 ENCOUNTER — Telehealth: Payer: Self-pay | Admitting: *Deleted

## 2006-05-31 ENCOUNTER — Emergency Department (HOSPITAL_COMMUNITY): Admission: EM | Admit: 2006-05-31 | Discharge: 2006-05-31 | Payer: Self-pay | Admitting: Emergency Medicine

## 2006-06-02 ENCOUNTER — Ambulatory Visit: Payer: Self-pay | Admitting: Family Medicine

## 2006-06-02 ENCOUNTER — Encounter (INDEPENDENT_AMBULATORY_CARE_PROVIDER_SITE_OTHER): Payer: Self-pay | Admitting: Family Medicine

## 2006-06-02 ENCOUNTER — Encounter (INDEPENDENT_AMBULATORY_CARE_PROVIDER_SITE_OTHER): Payer: Self-pay | Admitting: *Deleted

## 2006-06-02 ENCOUNTER — Ambulatory Visit (HOSPITAL_COMMUNITY): Admission: RE | Admit: 2006-06-02 | Discharge: 2006-06-02 | Payer: Self-pay | Admitting: Family Medicine

## 2006-06-25 ENCOUNTER — Telehealth (INDEPENDENT_AMBULATORY_CARE_PROVIDER_SITE_OTHER): Payer: Self-pay | Admitting: *Deleted

## 2006-06-26 ENCOUNTER — Ambulatory Visit: Payer: Self-pay | Admitting: Family Medicine

## 2006-07-02 ENCOUNTER — Telehealth: Payer: Self-pay | Admitting: *Deleted

## 2006-07-22 ENCOUNTER — Encounter (INDEPENDENT_AMBULATORY_CARE_PROVIDER_SITE_OTHER): Payer: Self-pay | Admitting: Family Medicine

## 2006-07-22 ENCOUNTER — Ambulatory Visit: Payer: Self-pay | Admitting: Sports Medicine

## 2006-07-22 LAB — CONVERTED CEMR LAB
ALT: 10 units/L (ref 0–35)
AST: 13 units/L (ref 0–37)
Albumin: 3.9 g/dL (ref 3.5–5.2)
Alkaline Phosphatase: 64 units/L (ref 39–117)
BUN: 8 mg/dL (ref 6–23)
Beta hcg, urine, semiquantitative: NEGATIVE
Bilirubin Urine: NEGATIVE
Blood in Urine, dipstick: NEGATIVE
CO2: 25 meq/L (ref 19–32)
Calcium: 9.9 mg/dL (ref 8.4–10.5)
Chloride: 102 meq/L (ref 96–112)
Cholesterol: 231 mg/dL — ABNORMAL HIGH (ref 0–200)
Creatinine, Ser: 0.83 mg/dL (ref 0.40–1.20)
Glucose, Bld: 80 mg/dL (ref 70–99)
Glucose, Urine, Semiquant: NEGATIVE
HDL: 62 mg/dL (ref >39)
Ketones, urine, test strip: NEGATIVE
LDL Cholesterol: 141 mg/dL — ABNORMAL HIGH (ref 0–99)
Nitrite: NEGATIVE
Potassium: 4.2 meq/L (ref 3.5–5.3)
Protein, U semiquant: NEGATIVE
Sodium: 139 meq/L (ref 135–145)
Specific Gravity, Urine: 1.025
TSH: 0.228 microintl units/mL — ABNORMAL LOW (ref 0.350–5.50)
Total Bilirubin: 0.3 mg/dL (ref 0.3–1.2)
Total CHOL/HDL Ratio: 3.7
Total Protein: 6.9 g/dL (ref 6.0–8.3)
Triglycerides: 138 mg/dL (ref <150)
Urobilinogen, UA: 0.2
VLDL: 28 mg/dL (ref 0–40)
WBC Urine, dipstick: NEGATIVE
pH: 5.5

## 2006-07-23 ENCOUNTER — Encounter (INDEPENDENT_AMBULATORY_CARE_PROVIDER_SITE_OTHER): Payer: Self-pay | Admitting: Family Medicine

## 2006-07-23 ENCOUNTER — Encounter (INDEPENDENT_AMBULATORY_CARE_PROVIDER_SITE_OTHER): Payer: Self-pay | Admitting: *Deleted

## 2006-07-23 ENCOUNTER — Encounter: Payer: Self-pay | Admitting: *Deleted

## 2006-07-23 LAB — CONVERTED CEMR LAB: Free T4: 1.27 ng/dL (ref 0.89–1.80)

## 2006-08-11 ENCOUNTER — Ambulatory Visit: Payer: Self-pay | Admitting: Family Medicine

## 2006-08-19 ENCOUNTER — Ambulatory Visit: Payer: Self-pay | Admitting: Family Medicine

## 2006-08-26 ENCOUNTER — Encounter: Admission: RE | Admit: 2006-08-26 | Discharge: 2006-08-26 | Payer: Self-pay | Admitting: Sports Medicine

## 2006-08-29 ENCOUNTER — Encounter: Payer: Self-pay | Admitting: *Deleted

## 2006-09-01 ENCOUNTER — Telehealth: Payer: Self-pay | Admitting: *Deleted

## 2006-09-10 ENCOUNTER — Ambulatory Visit: Payer: Self-pay | Admitting: Family Medicine

## 2006-09-10 LAB — CONVERTED CEMR LAB
Cholesterol, target level: 200 mg/dL
HDL goal, serum: 40 mg/dL
LDL Goal: 160 mg/dL

## 2006-09-15 ENCOUNTER — Encounter: Admission: RE | Admit: 2006-09-15 | Discharge: 2006-09-15 | Payer: Self-pay | Admitting: Family Medicine

## 2006-09-17 ENCOUNTER — Telehealth (INDEPENDENT_AMBULATORY_CARE_PROVIDER_SITE_OTHER): Payer: Self-pay | Admitting: Family Medicine

## 2006-09-19 ENCOUNTER — Ambulatory Visit: Payer: Self-pay | Admitting: Family Medicine

## 2006-09-19 ENCOUNTER — Encounter (INDEPENDENT_AMBULATORY_CARE_PROVIDER_SITE_OTHER): Payer: Self-pay | Admitting: Family Medicine

## 2006-10-08 ENCOUNTER — Telehealth (INDEPENDENT_AMBULATORY_CARE_PROVIDER_SITE_OTHER): Payer: Self-pay | Admitting: *Deleted

## 2006-10-15 ENCOUNTER — Encounter: Payer: Self-pay | Admitting: Family Medicine

## 2006-10-28 ENCOUNTER — Encounter (INDEPENDENT_AMBULATORY_CARE_PROVIDER_SITE_OTHER): Payer: Self-pay | Admitting: Family Medicine

## 2006-10-28 ENCOUNTER — Ambulatory Visit: Payer: Self-pay | Admitting: Family Medicine

## 2006-10-28 DIAGNOSIS — N6019 Diffuse cystic mastopathy of unspecified breast: Secondary | ICD-10-CM | POA: Insufficient documentation

## 2006-10-28 LAB — CONVERTED CEMR LAB
ALT: 12 units/L (ref 0–35)
AST: 13 units/L (ref 0–37)
Albumin: 4 g/dL (ref 3.5–5.2)
Alkaline Phosphatase: 61 units/L (ref 39–117)
BUN: 11 mg/dL (ref 6–23)
Bilirubin Urine: NEGATIVE
CO2: 21 meq/L (ref 19–32)
Calcium: 9.2 mg/dL (ref 8.4–10.5)
Chloride: 103 meq/L (ref 96–112)
Creatinine, Ser: 0.8 mg/dL (ref 0.40–1.20)
Direct LDL: 147 mg/dL — ABNORMAL HIGH
Glucose, Bld: 80 mg/dL (ref 70–99)
Glucose, Urine, Semiquant: NEGATIVE
HCT: 41.9 % (ref 36.0–46.0)
Hemoglobin: 14.7 g/dL (ref 12.0–15.0)
Ketones, urine, test strip: NEGATIVE
MCHC: 35.1 g/dL (ref 30.0–36.0)
MCV: 79.8 fL (ref 78.0–100.0)
Nitrite: NEGATIVE
Platelets: 353 10*3/uL (ref 150–400)
Potassium: 4 meq/L (ref 3.5–5.3)
Prolactin: 8.4 ng/mL
Protein, U semiquant: NEGATIVE
RBC: 5.25 M/uL — ABNORMAL HIGH (ref 3.87–5.11)
RDW: 14.7 % — ABNORMAL HIGH (ref 11.5–14.0)
Sodium: 137 meq/L (ref 135–145)
Specific Gravity, Urine: 1.02
Total Bilirubin: 0.4 mg/dL (ref 0.3–1.2)
Total Protein: 6.7 g/dL (ref 6.0–8.3)
Urobilinogen, UA: 0.2
WBC Urine, dipstick: NEGATIVE
WBC: 11.2 10*3/uL — ABNORMAL HIGH (ref 4.0–10.5)
pH: 6

## 2006-11-03 ENCOUNTER — Encounter: Admission: RE | Admit: 2006-11-03 | Discharge: 2006-11-03 | Payer: Self-pay | Admitting: Orthopaedic Surgery

## 2006-11-06 ENCOUNTER — Encounter (INDEPENDENT_AMBULATORY_CARE_PROVIDER_SITE_OTHER): Payer: Self-pay | Admitting: Family Medicine

## 2006-11-14 ENCOUNTER — Telehealth (INDEPENDENT_AMBULATORY_CARE_PROVIDER_SITE_OTHER): Payer: Self-pay | Admitting: Family Medicine

## 2006-11-18 ENCOUNTER — Telehealth (INDEPENDENT_AMBULATORY_CARE_PROVIDER_SITE_OTHER): Payer: Self-pay | Admitting: Family Medicine

## 2006-12-02 ENCOUNTER — Encounter (INDEPENDENT_AMBULATORY_CARE_PROVIDER_SITE_OTHER): Payer: Self-pay | Admitting: Family Medicine

## 2006-12-04 ENCOUNTER — Ambulatory Visit (HOSPITAL_COMMUNITY): Admission: RE | Admit: 2006-12-04 | Discharge: 2006-12-04 | Payer: Self-pay | Admitting: Anesthesiology

## 2006-12-16 ENCOUNTER — Telehealth: Payer: Self-pay | Admitting: *Deleted

## 2007-01-29 DIAGNOSIS — G4733 Obstructive sleep apnea (adult) (pediatric): Secondary | ICD-10-CM

## 2007-01-29 HISTORY — PX: BACK SURGERY: SHX140

## 2007-01-29 HISTORY — DX: Obstructive sleep apnea (adult) (pediatric): G47.33

## 2007-02-24 ENCOUNTER — Ambulatory Visit: Payer: Self-pay | Admitting: Family Medicine

## 2007-03-06 ENCOUNTER — Ambulatory Visit: Payer: Self-pay | Admitting: Family Medicine

## 2007-03-06 LAB — CONVERTED CEMR LAB: Rapid Strep: NEGATIVE

## 2007-03-25 ENCOUNTER — Encounter (INDEPENDENT_AMBULATORY_CARE_PROVIDER_SITE_OTHER): Payer: Self-pay | Admitting: Family Medicine

## 2007-04-08 ENCOUNTER — Ambulatory Visit: Payer: Self-pay | Admitting: Family Medicine

## 2007-04-08 ENCOUNTER — Telehealth (INDEPENDENT_AMBULATORY_CARE_PROVIDER_SITE_OTHER): Payer: Self-pay | Admitting: Family Medicine

## 2007-04-08 LAB — CONVERTED CEMR LAB: Rapid Strep: NEGATIVE

## 2007-04-29 ENCOUNTER — Telehealth (INDEPENDENT_AMBULATORY_CARE_PROVIDER_SITE_OTHER): Payer: Self-pay | Admitting: *Deleted

## 2007-05-01 ENCOUNTER — Encounter (INDEPENDENT_AMBULATORY_CARE_PROVIDER_SITE_OTHER): Payer: Self-pay | Admitting: Family Medicine

## 2007-05-06 ENCOUNTER — Encounter (INDEPENDENT_AMBULATORY_CARE_PROVIDER_SITE_OTHER): Payer: Self-pay | Admitting: Family Medicine

## 2007-05-19 ENCOUNTER — Telehealth (INDEPENDENT_AMBULATORY_CARE_PROVIDER_SITE_OTHER): Payer: Self-pay | Admitting: *Deleted

## 2007-05-20 ENCOUNTER — Encounter (INDEPENDENT_AMBULATORY_CARE_PROVIDER_SITE_OTHER): Payer: Self-pay | Admitting: Family Medicine

## 2007-05-29 ENCOUNTER — Encounter: Admission: RE | Admit: 2007-05-29 | Discharge: 2007-05-29 | Payer: Self-pay | Admitting: Family Medicine

## 2007-06-03 ENCOUNTER — Encounter: Admission: RE | Admit: 2007-06-03 | Discharge: 2007-07-03 | Payer: Self-pay | Admitting: Specialist

## 2007-06-16 ENCOUNTER — Encounter (INDEPENDENT_AMBULATORY_CARE_PROVIDER_SITE_OTHER): Payer: Self-pay | Admitting: Family Medicine

## 2007-07-03 ENCOUNTER — Ambulatory Visit (HOSPITAL_BASED_OUTPATIENT_CLINIC_OR_DEPARTMENT_OTHER): Admission: RE | Admit: 2007-07-03 | Discharge: 2007-07-03 | Payer: Self-pay | Admitting: Specialist

## 2007-07-06 ENCOUNTER — Encounter (INDEPENDENT_AMBULATORY_CARE_PROVIDER_SITE_OTHER): Payer: Self-pay | Admitting: Family Medicine

## 2007-07-27 ENCOUNTER — Telehealth: Payer: Self-pay | Admitting: Family Medicine

## 2007-07-28 ENCOUNTER — Ambulatory Visit: Payer: Self-pay | Admitting: Family Medicine

## 2007-07-28 ENCOUNTER — Telehealth (INDEPENDENT_AMBULATORY_CARE_PROVIDER_SITE_OTHER): Payer: Self-pay | Admitting: *Deleted

## 2007-07-28 ENCOUNTER — Encounter: Admission: RE | Admit: 2007-07-28 | Discharge: 2007-10-26 | Payer: Self-pay | Admitting: Specialist

## 2007-07-30 ENCOUNTER — Telehealth: Payer: Self-pay | Admitting: *Deleted

## 2007-08-03 ENCOUNTER — Telehealth: Payer: Self-pay | Admitting: Family Medicine

## 2007-08-20 ENCOUNTER — Ambulatory Visit: Payer: Self-pay | Admitting: Sports Medicine

## 2007-08-20 ENCOUNTER — Encounter: Payer: Self-pay | Admitting: Family Medicine

## 2007-08-20 LAB — CONVERTED CEMR LAB
ALT: 17 units/L (ref 0–35)
AST: 18 units/L (ref 0–37)
Albumin: 4.2 g/dL (ref 3.5–5.2)
Alkaline Phosphatase: 62 units/L (ref 39–117)
BUN: 10 mg/dL (ref 6–23)
CO2: 21 meq/L (ref 19–32)
Calcium: 9.3 mg/dL (ref 8.4–10.5)
Chloride: 108 meq/L (ref 96–112)
Cholesterol: 206 mg/dL — ABNORMAL HIGH (ref 0–200)
Creatinine, Ser: 0.72 mg/dL (ref 0.40–1.20)
Glucose, Bld: 87 mg/dL (ref 70–99)
HCT: 42.7 % (ref 36.0–46.0)
HDL: 45 mg/dL (ref >39)
Hemoglobin: 15.6 g/dL — ABNORMAL HIGH (ref 12.0–15.0)
LDL Cholesterol: 123 mg/dL — ABNORMAL HIGH (ref 0–99)
LH: 3.7 milliintl units/mL
MCHC: 36.5 g/dL — ABNORMAL HIGH (ref 30.0–36.0)
MCV: 77.9 fL — ABNORMAL LOW (ref 78.0–100.0)
Platelets: 361 10*3/uL (ref 150–400)
Potassium: 4.1 meq/L (ref 3.5–5.3)
RBC: 5.48 M/uL — ABNORMAL HIGH (ref 3.87–5.11)
RDW: 15.1 % (ref 11.5–15.5)
Sodium: 140 meq/L (ref 135–145)
TSH: 0.199 microintl units/mL — ABNORMAL LOW (ref 0.350–4.50)
Total Bilirubin: 0.4 mg/dL (ref 0.3–1.2)
Total CHOL/HDL Ratio: 4.6
Total Protein: 6.7 g/dL (ref 6.0–8.3)
Triglycerides: 188 mg/dL — ABNORMAL HIGH (ref <150)
VLDL: 38 mg/dL (ref 0–40)
WBC: 10 10*3/uL (ref 4.0–10.5)

## 2007-08-21 ENCOUNTER — Encounter: Payer: Self-pay | Admitting: Family Medicine

## 2007-08-24 ENCOUNTER — Ambulatory Visit (HOSPITAL_COMMUNITY): Admission: RE | Admit: 2007-08-24 | Discharge: 2007-08-24 | Payer: Self-pay | Admitting: Family Medicine

## 2007-08-25 ENCOUNTER — Encounter: Payer: Self-pay | Admitting: Family Medicine

## 2007-08-31 ENCOUNTER — Ambulatory Visit: Payer: Self-pay | Admitting: Family Medicine

## 2007-09-01 ENCOUNTER — Ambulatory Visit: Payer: Self-pay | Admitting: Family Medicine

## 2007-09-01 ENCOUNTER — Encounter: Payer: Self-pay | Admitting: Family Medicine

## 2007-09-04 ENCOUNTER — Encounter: Payer: Self-pay | Admitting: Family Medicine

## 2007-09-04 LAB — CONVERTED CEMR LAB: Pap Smear: NORMAL

## 2007-09-07 ENCOUNTER — Telehealth (INDEPENDENT_AMBULATORY_CARE_PROVIDER_SITE_OTHER): Payer: Self-pay | Admitting: *Deleted

## 2007-09-09 ENCOUNTER — Encounter: Payer: Self-pay | Admitting: Family Medicine

## 2007-09-15 ENCOUNTER — Ambulatory Visit: Payer: Self-pay | Admitting: Family Medicine

## 2007-09-15 ENCOUNTER — Ambulatory Visit: Payer: Self-pay | Admitting: Vascular Surgery

## 2007-09-15 ENCOUNTER — Encounter: Payer: Self-pay | Admitting: Family Medicine

## 2007-09-15 ENCOUNTER — Ambulatory Visit (HOSPITAL_COMMUNITY): Admission: RE | Admit: 2007-09-15 | Discharge: 2007-09-15 | Payer: Self-pay | Admitting: Family Medicine

## 2007-09-16 ENCOUNTER — Encounter: Payer: Self-pay | Admitting: Family Medicine

## 2007-09-18 ENCOUNTER — Ambulatory Visit (HOSPITAL_BASED_OUTPATIENT_CLINIC_OR_DEPARTMENT_OTHER): Admission: RE | Admit: 2007-09-18 | Discharge: 2007-09-18 | Payer: Self-pay | Admitting: Family Medicine

## 2007-09-18 ENCOUNTER — Encounter: Payer: Self-pay | Admitting: Family Medicine

## 2007-09-25 ENCOUNTER — Telehealth (INDEPENDENT_AMBULATORY_CARE_PROVIDER_SITE_OTHER): Payer: Self-pay | Admitting: Pharmacist

## 2007-09-26 ENCOUNTER — Ambulatory Visit: Payer: Self-pay | Admitting: Internal Medicine

## 2007-09-28 ENCOUNTER — Ambulatory Visit: Payer: Self-pay | Admitting: Family Medicine

## 2007-09-28 ENCOUNTER — Encounter: Payer: Self-pay | Admitting: Family Medicine

## 2007-09-28 ENCOUNTER — Ambulatory Visit (HOSPITAL_COMMUNITY): Admission: RE | Admit: 2007-09-28 | Discharge: 2007-09-28 | Payer: Self-pay | Admitting: Family Medicine

## 2007-09-28 LAB — CONVERTED CEMR LAB
Free T4: 1.37 ng/dL (ref 0.89–1.80)
T3, Free: 2.9 pg/mL (ref 2.3–4.2)
TSH: 0.321 microintl units/mL — ABNORMAL LOW (ref 0.350–4.50)

## 2007-09-30 ENCOUNTER — Encounter: Payer: Self-pay | Admitting: Family Medicine

## 2007-10-15 ENCOUNTER — Encounter: Payer: Self-pay | Admitting: Family Medicine

## 2007-10-22 ENCOUNTER — Ambulatory Visit: Payer: Self-pay | Admitting: Family Medicine

## 2007-11-05 ENCOUNTER — Encounter (INDEPENDENT_AMBULATORY_CARE_PROVIDER_SITE_OTHER): Payer: Self-pay

## 2007-11-05 ENCOUNTER — Ambulatory Visit: Payer: Self-pay | Admitting: Family Medicine

## 2007-11-30 ENCOUNTER — Ambulatory Visit: Payer: Self-pay | Admitting: Family Medicine

## 2007-12-02 ENCOUNTER — Encounter: Admission: RE | Admit: 2007-12-02 | Discharge: 2007-12-02 | Payer: Self-pay | Admitting: Orthopaedic Surgery

## 2007-12-15 ENCOUNTER — Telehealth (INDEPENDENT_AMBULATORY_CARE_PROVIDER_SITE_OTHER): Payer: Self-pay | Admitting: *Deleted

## 2007-12-15 ENCOUNTER — Encounter: Payer: Self-pay | Admitting: Family Medicine

## 2007-12-15 ENCOUNTER — Ambulatory Visit: Payer: Self-pay | Admitting: Family Medicine

## 2008-02-03 ENCOUNTER — Encounter: Payer: Self-pay | Admitting: Family Medicine

## 2008-02-03 ENCOUNTER — Ambulatory Visit: Payer: Self-pay | Admitting: Family Medicine

## 2008-02-03 DIAGNOSIS — G4733 Obstructive sleep apnea (adult) (pediatric): Secondary | ICD-10-CM | POA: Insufficient documentation

## 2008-02-04 ENCOUNTER — Encounter: Payer: Self-pay | Admitting: Family Medicine

## 2008-02-04 ENCOUNTER — Ambulatory Visit (HOSPITAL_BASED_OUTPATIENT_CLINIC_OR_DEPARTMENT_OTHER): Admission: RE | Admit: 2008-02-04 | Discharge: 2008-02-04 | Payer: Self-pay | Admitting: Specialist

## 2008-02-21 ENCOUNTER — Ambulatory Visit (HOSPITAL_BASED_OUTPATIENT_CLINIC_OR_DEPARTMENT_OTHER): Admission: RE | Admit: 2008-02-21 | Discharge: 2008-02-21 | Payer: Self-pay | Admitting: Family Medicine

## 2008-02-21 ENCOUNTER — Encounter: Payer: Self-pay | Admitting: Family Medicine

## 2008-02-26 ENCOUNTER — Ambulatory Visit: Payer: Self-pay | Admitting: Family Medicine

## 2008-02-27 ENCOUNTER — Ambulatory Visit: Payer: Self-pay | Admitting: Internal Medicine

## 2008-03-03 ENCOUNTER — Encounter: Payer: Self-pay | Admitting: Family Medicine

## 2008-03-11 ENCOUNTER — Ambulatory Visit: Payer: Self-pay | Admitting: Family Medicine

## 2008-03-11 ENCOUNTER — Encounter: Payer: Self-pay | Admitting: Family Medicine

## 2008-03-14 ENCOUNTER — Telehealth: Payer: Self-pay | Admitting: Family Medicine

## 2008-03-19 ENCOUNTER — Encounter: Admission: RE | Admit: 2008-03-19 | Discharge: 2008-03-19 | Payer: Self-pay | Admitting: Orthopaedic Surgery

## 2008-03-21 ENCOUNTER — Telehealth: Payer: Self-pay | Admitting: Family Medicine

## 2008-03-23 ENCOUNTER — Encounter: Payer: Self-pay | Admitting: Family Medicine

## 2008-05-30 ENCOUNTER — Encounter: Admission: RE | Admit: 2008-05-30 | Discharge: 2008-05-30 | Payer: Self-pay | Admitting: Family Medicine

## 2008-05-30 ENCOUNTER — Telehealth: Payer: Self-pay | Admitting: Family Medicine

## 2008-06-03 ENCOUNTER — Ambulatory Visit: Payer: Self-pay | Admitting: Family Medicine

## 2008-06-06 ENCOUNTER — Ambulatory Visit: Payer: Self-pay | Admitting: Family Medicine

## 2008-06-15 ENCOUNTER — Ambulatory Visit (HOSPITAL_COMMUNITY): Admission: RE | Admit: 2008-06-15 | Discharge: 2008-06-15 | Payer: Self-pay | Admitting: Family Medicine

## 2008-06-15 ENCOUNTER — Ambulatory Visit: Payer: Self-pay | Admitting: Cardiovascular Disease

## 2008-06-15 ENCOUNTER — Encounter: Payer: Self-pay | Admitting: Family Medicine

## 2008-06-16 ENCOUNTER — Encounter: Payer: Self-pay | Admitting: Family Medicine

## 2008-06-21 ENCOUNTER — Encounter: Admission: RE | Admit: 2008-06-21 | Discharge: 2008-06-21 | Payer: Self-pay | Admitting: Orthopaedic Surgery

## 2008-08-04 ENCOUNTER — Encounter: Payer: Self-pay | Admitting: Family Medicine

## 2008-08-04 ENCOUNTER — Ambulatory Visit: Payer: Self-pay | Admitting: Family Medicine

## 2008-08-04 DIAGNOSIS — I10 Essential (primary) hypertension: Secondary | ICD-10-CM | POA: Insufficient documentation

## 2008-08-04 LAB — CONVERTED CEMR LAB
Bilirubin Urine: NEGATIVE
Blood in Urine, dipstick: NEGATIVE
Glucose, Urine, Semiquant: NEGATIVE
Ketones, urine, test strip: NEGATIVE
Nitrite: NEGATIVE
Protein, U semiquant: NEGATIVE
Specific Gravity, Urine: 1.01
Urobilinogen, UA: 0.2
pH: 6.5

## 2008-08-05 ENCOUNTER — Ambulatory Visit: Payer: Self-pay | Admitting: Surgery

## 2008-08-05 ENCOUNTER — Ambulatory Visit: Admission: RE | Admit: 2008-08-05 | Discharge: 2008-08-05 | Payer: Self-pay | Admitting: Family Medicine

## 2008-08-05 ENCOUNTER — Encounter: Payer: Self-pay | Admitting: Family Medicine

## 2008-08-11 ENCOUNTER — Telehealth: Payer: Self-pay | Admitting: Family Medicine

## 2008-09-14 ENCOUNTER — Encounter: Payer: Self-pay | Admitting: Family Medicine

## 2008-09-14 ENCOUNTER — Ambulatory Visit: Payer: Self-pay | Admitting: Family Medicine

## 2008-09-14 LAB — CONVERTED CEMR LAB: Whiff Test: NEGATIVE

## 2008-09-15 ENCOUNTER — Encounter: Payer: Self-pay | Admitting: Family Medicine

## 2008-09-15 ENCOUNTER — Ambulatory Visit (HOSPITAL_COMMUNITY): Admission: RE | Admit: 2008-09-15 | Discharge: 2008-09-15 | Payer: Self-pay | Admitting: Family Medicine

## 2008-09-15 ENCOUNTER — Ambulatory Visit: Payer: Self-pay | Admitting: Vascular Surgery

## 2008-09-16 ENCOUNTER — Encounter: Payer: Self-pay | Admitting: Family Medicine

## 2008-09-19 ENCOUNTER — Ambulatory Visit: Payer: Self-pay | Admitting: Family Medicine

## 2008-09-19 ENCOUNTER — Encounter: Payer: Self-pay | Admitting: Family Medicine

## 2008-09-26 ENCOUNTER — Telehealth: Payer: Self-pay | Admitting: Family Medicine

## 2008-09-30 ENCOUNTER — Telehealth: Payer: Self-pay | Admitting: *Deleted

## 2008-10-12 ENCOUNTER — Encounter: Payer: Self-pay | Admitting: Family Medicine

## 2008-10-20 ENCOUNTER — Encounter: Payer: Self-pay | Admitting: Family Medicine

## 2008-10-21 ENCOUNTER — Encounter: Admission: RE | Admit: 2008-10-21 | Discharge: 2008-10-21 | Payer: Self-pay | Admitting: Orthopaedic Surgery

## 2008-10-26 ENCOUNTER — Telehealth: Payer: Self-pay | Admitting: Family Medicine

## 2008-11-30 ENCOUNTER — Encounter: Payer: Self-pay | Admitting: Family Medicine

## 2008-12-06 ENCOUNTER — Ambulatory Visit: Payer: Self-pay | Admitting: Family Medicine

## 2008-12-06 ENCOUNTER — Encounter: Payer: Self-pay | Admitting: Family Medicine

## 2008-12-07 ENCOUNTER — Telehealth: Payer: Self-pay | Admitting: Family Medicine

## 2008-12-07 ENCOUNTER — Encounter: Payer: Self-pay | Admitting: Family Medicine

## 2008-12-09 ENCOUNTER — Telehealth: Payer: Self-pay | Admitting: Family Medicine

## 2008-12-09 ENCOUNTER — Ambulatory Visit (HOSPITAL_COMMUNITY): Admission: RE | Admit: 2008-12-09 | Discharge: 2008-12-09 | Payer: Self-pay | Admitting: Family Medicine

## 2008-12-14 ENCOUNTER — Ambulatory Visit: Payer: Self-pay | Admitting: Family Medicine

## 2008-12-14 LAB — CONVERTED CEMR LAB
Bilirubin Urine: NEGATIVE
Blood in Urine, dipstick: NEGATIVE
Glucose, Urine, Semiquant: NEGATIVE
Ketones, urine, test strip: NEGATIVE
Nitrite: NEGATIVE
Protein, U semiquant: NEGATIVE
Specific Gravity, Urine: 1.015
Urobilinogen, UA: 0.2
WBC Urine, dipstick: NEGATIVE
pH: 7

## 2009-03-15 ENCOUNTER — Encounter: Payer: Self-pay | Admitting: Family Medicine

## 2009-03-15 ENCOUNTER — Ambulatory Visit: Payer: Self-pay | Admitting: Family Medicine

## 2009-03-15 LAB — CONVERTED CEMR LAB
ALT: 16 units/L (ref 0–35)
AST: 19 units/L (ref 0–37)
CO2: 28 meq/L (ref 19–32)
Calcium: 9.6 mg/dL (ref 8.4–10.5)
Chloride: 103 meq/L (ref 96–112)
Creatinine, Ser: 0.77 mg/dL (ref 0.40–1.20)
Eosinophils Absolute: 0.4 10*3/uL (ref 0.0–0.7)
Glucose, Urine, Semiquant: NEGATIVE
Lymphocytes Relative: 34 % (ref 12–46)
Lymphs Abs: 4.6 10*3/uL — ABNORMAL HIGH (ref 0.7–4.0)
Monocytes Relative: 8 % (ref 3–12)
Neutro Abs: 7.6 10*3/uL (ref 1.7–7.7)
Neutrophils Relative %: 55 % (ref 43–77)
Platelets: 299 10*3/uL (ref 150–400)
Potassium: 4 meq/L (ref 3.5–5.3)
RBC: 5.51 M/uL — ABNORMAL HIGH (ref 3.87–5.11)
Sodium: 141 meq/L (ref 135–145)
Specific Gravity, Urine: 1.01
Total Protein: 6.3 g/dL (ref 6.0–8.3)
Urobilinogen, UA: 0.2
WBC: 13.8 10*3/uL — ABNORMAL HIGH (ref 4.0–10.5)

## 2009-03-16 ENCOUNTER — Ambulatory Visit (HOSPITAL_COMMUNITY): Admission: RE | Admit: 2009-03-16 | Discharge: 2009-03-16 | Payer: Self-pay | Admitting: Family Medicine

## 2009-03-17 ENCOUNTER — Emergency Department (HOSPITAL_COMMUNITY): Admission: EM | Admit: 2009-03-17 | Discharge: 2009-03-17 | Payer: Self-pay | Admitting: Emergency Medicine

## 2009-03-17 ENCOUNTER — Telehealth: Payer: Self-pay | Admitting: Family Medicine

## 2009-03-28 ENCOUNTER — Encounter: Payer: Self-pay | Admitting: Family Medicine

## 2009-04-18 ENCOUNTER — Ambulatory Visit: Payer: Self-pay | Admitting: Family Medicine

## 2009-04-21 ENCOUNTER — Encounter: Admission: RE | Admit: 2009-04-21 | Discharge: 2009-04-21 | Payer: Self-pay | Admitting: Orthopaedic Surgery

## 2009-05-02 ENCOUNTER — Ambulatory Visit: Payer: Self-pay | Admitting: Family Medicine

## 2009-06-14 ENCOUNTER — Encounter: Admission: RE | Admit: 2009-06-14 | Discharge: 2009-06-14 | Payer: Self-pay | Admitting: Family Medicine

## 2009-06-14 ENCOUNTER — Ambulatory Visit: Payer: Self-pay | Admitting: Family Medicine

## 2009-06-14 ENCOUNTER — Encounter: Payer: Self-pay | Admitting: Family Medicine

## 2009-06-14 ENCOUNTER — Telehealth: Payer: Self-pay | Admitting: Family Medicine

## 2009-06-14 DIAGNOSIS — L659 Nonscarring hair loss, unspecified: Secondary | ICD-10-CM | POA: Insufficient documentation

## 2009-07-03 ENCOUNTER — Encounter: Payer: Self-pay | Admitting: Family Medicine

## 2009-07-27 ENCOUNTER — Ambulatory Visit: Payer: Self-pay | Admitting: Family Medicine

## 2009-07-27 ENCOUNTER — Telehealth: Payer: Self-pay | Admitting: Family Medicine

## 2009-07-27 DIAGNOSIS — R32 Unspecified urinary incontinence: Secondary | ICD-10-CM | POA: Insufficient documentation

## 2009-07-27 LAB — CONVERTED CEMR LAB
Ketones, urine, test strip: NEGATIVE
Nitrite: NEGATIVE
Urobilinogen, UA: 0.2
WBC Urine, dipstick: NEGATIVE

## 2009-08-04 ENCOUNTER — Ambulatory Visit: Payer: Self-pay | Admitting: Family Medicine

## 2009-08-09 ENCOUNTER — Telehealth: Payer: Self-pay | Admitting: Family Medicine

## 2009-09-12 ENCOUNTER — Telehealth: Payer: Self-pay | Admitting: Family Medicine

## 2009-09-25 ENCOUNTER — Encounter: Payer: Self-pay | Admitting: Family Medicine

## 2009-10-25 ENCOUNTER — Ambulatory Visit: Payer: Self-pay | Admitting: Family Medicine

## 2009-10-25 DIAGNOSIS — N949 Unspecified condition associated with female genital organs and menstrual cycle: Secondary | ICD-10-CM | POA: Insufficient documentation

## 2009-10-25 DIAGNOSIS — G43909 Migraine, unspecified, not intractable, without status migrainosus: Secondary | ICD-10-CM | POA: Insufficient documentation

## 2009-10-25 DIAGNOSIS — N938 Other specified abnormal uterine and vaginal bleeding: Secondary | ICD-10-CM | POA: Insufficient documentation

## 2009-10-25 DIAGNOSIS — N925 Other specified irregular menstruation: Secondary | ICD-10-CM | POA: Insufficient documentation

## 2009-12-01 ENCOUNTER — Encounter: Payer: Self-pay | Admitting: Family Medicine

## 2009-12-13 ENCOUNTER — Ambulatory Visit: Payer: Self-pay | Admitting: Family Medicine

## 2009-12-13 DIAGNOSIS — J209 Acute bronchitis, unspecified: Secondary | ICD-10-CM | POA: Insufficient documentation

## 2009-12-15 ENCOUNTER — Telehealth: Payer: Self-pay | Admitting: Family Medicine

## 2009-12-20 ENCOUNTER — Ambulatory Visit: Payer: Self-pay | Admitting: Family Medicine

## 2010-01-15 ENCOUNTER — Telehealth: Payer: Self-pay | Admitting: Family Medicine

## 2010-01-16 ENCOUNTER — Encounter
Admission: RE | Admit: 2010-01-16 | Discharge: 2010-01-16 | Payer: Self-pay | Source: Home / Self Care | Attending: Sports Medicine | Admitting: Sports Medicine

## 2010-01-16 ENCOUNTER — Ambulatory Visit: Payer: Self-pay | Admitting: Family Medicine

## 2010-01-17 ENCOUNTER — Telehealth: Payer: Self-pay | Admitting: Sports Medicine

## 2010-01-26 ENCOUNTER — Ambulatory Visit: Admission: RE | Admit: 2010-01-26 | Discharge: 2010-01-26 | Payer: Self-pay | Source: Home / Self Care

## 2010-01-26 DIAGNOSIS — K219 Gastro-esophageal reflux disease without esophagitis: Secondary | ICD-10-CM | POA: Insufficient documentation

## 2010-02-08 ENCOUNTER — Telehealth: Payer: Self-pay | Admitting: Psychology

## 2010-02-18 ENCOUNTER — Encounter: Payer: Self-pay | Admitting: Family Medicine

## 2010-02-18 ENCOUNTER — Encounter: Payer: Self-pay | Admitting: Sports Medicine

## 2010-02-19 ENCOUNTER — Encounter: Payer: Self-pay | Admitting: Family Medicine

## 2010-02-19 ENCOUNTER — Encounter: Payer: Self-pay | Admitting: Orthopaedic Surgery

## 2010-02-19 ENCOUNTER — Telehealth: Payer: Self-pay | Admitting: *Deleted

## 2010-02-22 ENCOUNTER — Ambulatory Visit: Admit: 2010-02-22 | Payer: Self-pay

## 2010-02-25 LAB — CONVERTED CEMR LAB
ALT: 14 units/L (ref 0–35)
ALT: 16 units/L (ref 0–35)
AST: 15 units/L (ref 0–37)
AST: 16 units/L (ref 0–37)
Albumin: 3.9 g/dL (ref 3.5–5.2)
Albumin: 4.2 g/dL (ref 3.5–5.2)
Alkaline Phosphatase: 60 units/L (ref 39–117)
Alkaline Phosphatase: 67 units/L (ref 39–117)
BUN: 7 mg/dL (ref 6–23)
BUN: 9 mg/dL (ref 6–23)
CO2: 22 meq/L (ref 19–32)
CO2: 28 meq/L (ref 19–32)
Calcium: 10.1 mg/dL (ref 8.4–10.5)
Calcium: 9.2 mg/dL (ref 8.4–10.5)
Chloride: 101 meq/L (ref 96–112)
Chloride: 106 meq/L (ref 96–112)
Cholesterol: 196 mg/dL (ref 0–200)
Creatinine, Ser: 0.85 mg/dL (ref 0.40–1.20)
Creatinine, Ser: 0.93 mg/dL (ref 0.40–1.20)
Glucose, Bld: 100 mg/dL — ABNORMAL HIGH (ref 70–99)
Glucose, Bld: 92 mg/dL (ref 70–99)
HCV Ab: NEGATIVE
HDL: 44 mg/dL (ref >39)
Hep A IgM: NEGATIVE
Hep B C IgM: NEGATIVE
Hepatitis B Surface Ag: POSITIVE — AB
Iron: 71 ug/dL (ref 42–145)
LDL Cholesterol: 134 mg/dL — ABNORMAL HIGH (ref 0–99)
Potassium: 4.3 meq/L (ref 3.5–5.3)
Potassium: 4.5 meq/L (ref 3.5–5.3)
Saturation Ratios: 20 % (ref 20–55)
Sodium: 141 meq/L (ref 135–145)
Sodium: 143 meq/L (ref 135–145)
TIBC: 351 ug/dL (ref 250–470)
TSH: 0.139 microintl units/mL — ABNORMAL LOW (ref 0.350–5.50)
Total Bilirubin: 0.3 mg/dL (ref 0.3–1.2)
Total Bilirubin: 0.4 mg/dL (ref 0.3–1.2)
Total CHOL/HDL Ratio: 4.5
Total Protein: 6.4 g/dL (ref 6.0–8.3)
Total Protein: 7.2 g/dL (ref 6.0–8.3)
Triglycerides: 90 mg/dL (ref <150)
UIBC: 280 ug/dL
VLDL: 18 mg/dL (ref 0–40)

## 2010-02-27 NOTE — Progress Notes (Signed)
Summary: triage  Phone Note Call from Patient Call back at Home Phone [phone]   Caller: Patient Summary of Call: Pt's anunt has mercer and she has been in contact with her and wondering if she should be concerned.   Initial call taken by: Raymond Gurney,  September 12, 2009 1:50 PM  Follow-up for Phone Call        her 45 yr old aunt is in hospital. they found MRSA in her nose. pt kissed her on the cheek & is worried. told her probably will be fine. do not kiss or touch again. do not touch her handrails while in hosp bed. they do not live together. told her to call us if she gets a sore or hard pimple on her face or other body part. discussed transmission & handwashing Follow-up by: Elige Radon RN,  September 12, 2009 2:21 PM

## 2010-02-27 NOTE — Miscellaneous (Signed)
Summary: Clean up old acute medications  Clinical Lists Changes  Medications: Removed medication of METHOCARBAMOL 750 MG  TABS (METHOCARBAMOL) 1 tab by mouth q8h

## 2010-02-27 NOTE — Assessment & Plan Note (Signed)
Summary: fu/kh   Vital Signs:  Patient profile:   45 year old female Height:      69 inches Weight:      359.7 pounds BMI:     53.31 Temp:     98.1 degrees F oral Pulse rate:   108 / minute BP sitting:   164 / 94  (right arm) Cuff size:   regular  Vitals Entered By: Levert Feinstein LPN (February 16, 624THL 4:18 PM) CC: lower abd pai, wnats bloodwork Is Patient Diabetic? No Pain Assessment Patient in pain? yes     Location: lower abd   Primary Care Provider:  Graciella Belton MD  CC:  [facility] pai and wnats bloodwork.  History of Present Illness: 45 yo female with:  LUQ PAIN.  Complains of LEFT upper flank pain front and back, radiating to pubis for the past week.  The pain is intermittent.  She denies dysuria, vaginal discharge, hematuria, hematochezia, melena, nausea, vomiting, diarrhea, constipation.  ICE PICA.  Past several weeks.  Medication list reviewed and updated, allergies reviewed and updated, smoking status is per vitals, ROS is per HPI.   Habits & Providers  Alcohol-Tobacco-Diet     Tobacco Status: current  Current Medications (verified): 1)  Lotensin Hct 20-12.5 Mg Tab (Benazepril-Hctz) .... Take 1 Tablet By Mouth Once A Day 2)  Amitriptyline Hcl 75 Mg Tabs (Amitriptyline Hcl) .[name] Kitchen.. 1 Tablet By Mouth At Bedtime 3)  Aspirin 81 Mg Chew (Aspirin) .... Take 1 Tablet By Mouth Once A Day 4)  Caltrate 600+d Plus 600-400 Mg-Unit Tabs (Calcium Carbonate-Vit D-Min) .... Take 1 Tablet By Mouth Three Times A Day 5)  Neurontin 800 Mg Tabs (Gabapentin) .... Take 1 Tablet By Mouth Four Times A Day 6)  Singulair 10 Mg Tabs (Montelukast Sodium) .... Take 1 Tablet By Mouth Once A Day 7)  Trazodone Hcl 100 Mg Tabs (Trazodone Hcl) .... Take 1 Tablet By Mouth At Bedtime 8)  Prevacid 30 Mg  Cpdr (Lansoprazole) .... One Tablet Once Daily For Dyspepsia 9)  Allegra 180 Mg  Tabs (Fexofenadine Hcl) .[name] Kitchen.. 1 Tablet Daily.  Patient Has Failed Claritin and Zyrtec. 10)  Effexor Xr 75 Mg   Xr24h-Cap (Venlafaxine Hcl) .... 2 Tabs By Mouth Daily 11)  Sucralfate 1 Gm  Tabs (Sucralfate) .[name] Kitchen.. 1 Tab By Mouth Daily 12)  Methocarbamol 750 Mg  Tabs (Methocarbamol) .[name] Kitchen.. 1 Tab By Mouth Q8h 13)  Oxybutynin Chloride 15 Mg  Tb24 (Oxybutynin Chloride) .[name] Kitchen.. 1 Tab By Mouth Daily 14)  Naproxen 375 Mg  Tabs (Naproxen) .[name] Kitchen.. 1 Tab By Mouth Two Times A Day 15)  One Daily   Tabs (Multiple Vitamin) .[name] Kitchen.. 1 Tab Daily By Mouth 16)  Docusate Sodium 100 Mg  Tabs (Docusate Sodium) .... 2 Tabs By Mouth Qam and 2 Tabs By Mouth Qpm 17)  Depo-Provera 150 Mg/ml Im Susp (Medroxyprogesterone Acetate) .[name] KitchenMarland KitchenMarland Kitchen 150 Mg Im Q19months 18)  Furosemide 20 Mg  Tabs (Furosemide) .[name] Kitchen.. 1 Tab By Mouth Qam As Needed For Leg Swelling 19)  Opana Er 20 Mg Xr12h-Tab (Oxymorphone Hcl) .[name] Kitchen.. 1 Every 12 Hours 20)  Exalgo 12 Mg (Hydromorphone Extended Release) .[name] Kitchen.. 1 Tablet By Mouth Once Daily 21)  Toprol Xl 50 Mg Xr24h-Tab (Metoprolol Succinate) .... One Tab By Mouth Daily 22)  Metanx 3-35-2 Mg Tabs (L-Methylfolate-B6-B12) .... Take As Directed By Pain Medicine.  Allergies (verified): 1)  ! Levofloxacin (Levofloxacin) 2)  Methadone Hcl (Methadone Hcl)  Physical Exam  Additional Exam:  VITALS:  Reviewed, hypertensive GEN:  Alert & oriented, no acute distress, obese CARDIO: Regular rate and rhythm, no murmurs/rubs/gallops, 2+ bilateral radial pulses RESP: Clear to auscultation, normal work of breathing, no retractions/accessory muscle use ABD: Obese, no guarding/rebound, LUQ mild tenderness, no CVA tenderness    Impression & Recommendations:  Problem # 1:  ABDOMINAL PAIN, LEFT UPPER QUADRANT (ICD-789.02) Assessment New Possible ureteral stone--check CT stone protocol.  U/A shows positive LE but no blood, culture pending.  Check CMet, Lipase. 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 Orders: [facility]- Est  Level 4 (99214) CBC w/Diff-[facility] [id number])  Complete Medication List: 1)  Lotensin Hct 20-12.5 Mg Tab (Benazepril-hctz) .... Take 1 tablet by mouth once a day 2)  Amitriptyline Hcl 75 Mg Tabs (Amitriptyline hcl) .[name] Kitchen.. 1 tablet by mouth at bedtime 3)  Aspirin 81 Mg Chew (Aspirin) .... Take 1 tablet by mouth once a day 4)  Caltrate 600+d Plus 600-400 Mg-unit Tabs (Calcium carbonate-vit d-min) .... Take 1 tablet by mouth three times a day 5)  Neurontin 800 Mg Tabs (Gabapentin) .... Take 1 tablet by mouth four times a day 6)  Singulair 10 Mg Tabs (Montelukast sodium) .... Take 1 tablet by mouth once a day 7)  Trazodone Hcl 100 Mg Tabs (Trazodone hcl) .... Take 1 tablet by mouth at bedtime 8)  Prevacid 30 Mg Cpdr (Lansoprazole) .... One tablet once daily for dyspepsia 9)  Allegra 180 Mg Tabs (Fexofenadine hcl) .[name] Kitchen.. 1 tablet daily.  patient has failed claritin and zyrtec. 10)  Effexor Xr 75 Mg Xr24h-cap (Venlafaxine hcl) .... 2 tabs by mouth daily 11)  Sucralfate 1 Gm Tabs (Sucralfate) .[name] Kitchen.. 1 tab by mouth daily 12)  Methocarbamol 750 Mg Tabs (Methocarbamol) .[name] Kitchen.. 1 tab by mouth q8h 13)  Oxybutynin Chloride 15 Mg Tb24 (Oxybutynin chloride) .[name] Kitchen.. 1 tab by mouth daily 14)  Naproxen 375 Mg Tabs (Naproxen) .[name] Kitchen.. 1 tab by mouth two times a day 15)  One Daily Tabs (Multiple vitamin) .[name] Kitchen.. 1 tab daily by mouth 16)  Docusate Sodium 100 Mg Tabs (Docusate sodium) .... 2 tabs by mouth qam and 2 tabs by mouth qpm 17)  Depo-provera 150 Mg/ml Im Susp (Medroxyprogesterone acetate) .[name] KitchenMarland KitchenMarland Kitchen 150 mg im q36months 18)  Furosemide 20 Mg Tabs (Furosemide) .[name] Kitchen.. 1 tab by mouth qam as needed for leg swelling 19)  Opana Er 20 Mg Xr12h-tab (Oxymorphone hcl) .[name] Kitchen.. 1 every 12 hours 20)  Exalgo 12 Mg (hydromorphone Extended Release)  .[name] Kitchen.. 1 tablet by mouth once daily 21)  Toprol Xl 50 Mg Xr24h-tab (Metoprolol succinate) .... One tab by mouth daily 22)   Metanx 3-35-2 Mg Tabs (L-methylfolate-b6-b12) .... Take as directed by pain medicine.  Other Orders: Urinalysis-[facility] (00000)  Patient Instructions: 1)  I have ordered a CT to look for kidney stones. 2)  Your urine looked fairly normal, but I am sending it for culture. 3)  I have ordered blood work to evaluate ice-chewing (Pica)--this can be a sign of anemia.  Laboratory Results   Urine Tests  Date/Time Received: March 15, 2009 4:26 PM  Date/Time Reported: March 15, 2009 5:01 PM   Routine Urinalysis   Color: straw Appearance: Clear Glucose: negative   (Normal Range: Negative) Bilirubin: negative   (Normal Range: Negative) Ketone: negative   (Normal Range: Negative) Spec. Gravity: 1.010   (Normal Range: 1.003-1.035) Blood: negative   (Normal  Range: Negative) pH: 7.0   (Normal Range: 5.0-8.0) Protein: negative   (Normal Range: Negative) Urobilinogen: 0.2   (Normal Range: 0-1) Nitrite: negative   (Normal Range: Negative) Leukocyte Esterace: trace   (Normal Range: Negative)  Urine Microscopic WBC/HPF: 1-5 RBC/HPF: rare Bacteria/HPF: 2+ rods Epithelial/HPF: 1-5    Comments: ...........test performed by...........[name] KitchenHedy Camara, CMA

## 2010-02-27 NOTE — Assessment & Plan Note (Signed)
Summary: cold/congestion,df   Vital Signs:  Patient profile:   45 year old female Height:      69 inches Weight:      348 pounds Temp:     99.3 degrees F Pulse rate:   106 / minute BP sitting:   183 / 96  (left arm)  Vitals Entered By: Marcell Barlow RN (December 13, 2009 9:41 AM) CC: cold symptoms since 12/01/2009, thick sputum, congestion, some cough Is Patient Diabetic? No Pain Assessment Patient in pain? yes     Location: back Intensity: 8 Type: sharp   CC:  cold symptoms since 12/01/2009, thick sputum, congestion, and some cough.  Habits & Providers  Alcohol-Tobacco-Diet     Tobacco Status: current     Tobacco Counseling: to quit use of tobacco products     Cigarette Packs/Day: 1.0  Allergies: 1)  ! Levofloxacin (Levofloxacin) 2)  Methadone Hcl (Methadone Hcl)  Social History: Smoking Status:  current Packs/Day:  1.0   Complete Medication List: 1)  Lotensin Hct 20-12.5 Mg Tab (Benazepril-hctz) .... Take 1 tablet by mouth once a day 2)  Amitriptyline Hcl 75 Mg Tabs (Amitriptyline hcl) .[name] Kitchen.. 1 tablet by mouth at bedtime 3)  Aspirin 81 Mg Chew (Aspirin) .... Take 1 tablet by mouth once a day 4)  Caltrate 600+d Plus 600-400 Mg-unit Tabs (Calcium carbonate-vit d-min) .... Take 1 tablet by mouth three times a day 5)  Neurontin 800 Mg Tabs (Gabapentin) .... Take 1 tablet by mouth four times a day 6)  Singulair 10 Mg Tabs (Montelukast sodium) .... Take 1 tablet by mouth once a day 7)  Trazodone Hcl 100 Mg Tabs (Trazodone hcl) .... Take 1 tablet by mouth at bedtime 8)  Prevacid 30 Mg Cpdr (Lansoprazole) .... One tablet once daily for dyspepsia 9)  Effexor Xr 75 Mg Xr24h-cap (Venlafaxine hcl) .... 2 tabs by mouth daily 10)  Sucralfate 1 Gm Tabs (Sucralfate) .[name] Kitchen.. 1 tab by mouth daily 11)  Oxybutynin Chloride 15 Mg Tb24 (Oxybutynin chloride) .[name] Kitchen.. 1 tab by mouth daily 12)  Naproxen 375 Mg Tabs (Naproxen) .[name] Kitchen.. 1 tab by mouth two times a day 13)  One Daily Tabs (Multiple  vitamin) .[name] Kitchen.. 1 tab daily by mouth 14)  Docusate Sodium 100 Mg Tabs (Docusate sodium) .... 2 tabs by mouth qam and 2 tabs by mouth qpm 15)  Furosemide 20 Mg Tabs (Furosemide) .[name] Kitchen.. 1 tab by mouth qam as needed for leg swelling 16)  Exalgo 12 Mg (hydromorphone Extended Release)  .[name] Kitchen.. 1 tablet by mouth once daily 17)  Toprol Xl 50 Mg Xr24h-tab (Metoprolol succinate) .... One tab by mouth daily 18)  Metanx 3-35-2 Mg Tabs (L-methylfolate-b6-b12) .... Take as directed by pain medicine. 19)  Cetirizine Hcl 10 Mg Tabs (Cetirizine hcl) .[name] Kitchen.. 1 tab by mouth daily 20)  Promethazine Hcl 25 Mg Tabs (Promethazine hcl) .[name] Kitchen.. 1 tab by mouth every 6 hours as needed for nausea 21)  Provera 10 Mg Tabs (Medroxyprogesterone acetate) .[name] Kitchen.. 1 tab daily for the next 5 days      Vital Signs:  Patient profile:   45 year old female Height:      69 inches Weight:      348 pounds Temp:     99.3 degrees F Pulse rate:   106 / minute BP sitting:   183 / 96  (left arm)  Vitals Entered By: Marcell Barlow RN (December 13, 2009 9:41 AM)   Appended Document: cold/congestion,df   PCP:  Graciella Belton MD  Chief  Complaint:  Cold / Congestion.  History of Present Illness: 1. Cold / Congestion:  Pt has been feeling bad since 11-4.  Her granddaughter had some form of infection, and possible strep throat and was given some antibiotics.  She thinks that she got it from her.  She has been getting worse.  Her main symptoms include fever, productive cough, back pain, chills, and diarrhea.  ROS: denies sinus pain / pressure, sore throat, n/v  2. HTN:  She hasn't been taking her blood pressure medicine because she feels so bad.  ROS: denies chest pain, shortness of breath  BP recheck 165 /  96   Social History: Reviewed history from 08/04/2009 and no changes required. Disabled from chronic back pain.  Lives with 2 sons.  No EtOH since 123456, no illicit drugs.  Former EtOH and substance abuse.   Smokes 1 ppd.   Physical  Exam  General:  VITALS:  Reviewed, normal GEN: Morbidly obses.  Alert & oriented, no acute distress CARDIO: Regular rate and rhythm, no murmurs/rubs/gallops, 2+ bilateral radial pulses RESP: Clear to auscultation, normal work of breathing, no retractions/accessory muscle use ABD: Obese, normoactive bowel sounds, nontender, no masses/hepatosplenomegaly Neuro: CNII-XII intact.  5/5 strength in all extremities.  Normal sensation.  No focal deficits. Ext: no LE edema   Impression & Recommendations:  Problem # 1:  ACUTE BRONCHITIS (ICD-466.0) Assessment New  Treat with antibiotics.  Follow up in 5-7 days if not better. Her updated medication list for this problem includes:    Singulair 10 Mg Tabs (Montelukast sodium) .[name] Kitchen... Take 1 tablet by mouth once a day    Azithromycin 1 Gm Pack (Azithromycin) .[name] Kitchen... Take as directed    Guaifenesin-dm 100-10 Mg/38ml Syrp (Dextromethorphan-guaifenesin) .[name] Kitchen... 1 teaspoon by mouth every 6 hours as needed for cough  Orders: [facility]- Est  Level 4 [id number])  Problem # 2:  ESSENTIAL HYPERTENSION (ICD-401.9) Assessment: Deteriorated  Not at goal.  Likely from not taking her medicines.  Advised her to start taking her medicines once she starts feeling better. Her updated medication list for this problem includes:    Lotensin Hct 20-12.5 Mg Tab (Benazepril-hctz) .[name] Kitchen... Take 1 tablet by mouth once a day    Furosemide 20 Mg Tabs (Furosemide) .[name] Kitchen... 1 tab by mouth qam as needed for leg swelling    Toprol Xl 50 Mg Xr24h-tab (Metoprolol succinate) ..... One tab by mouth daily  Orders: [facility]- Est  Level 4 [id number])  Complete Medication List: 1)  Lotensin Hct 20-12.5 Mg Tab (Benazepril-hctz) .... Take 1 tablet by mouth once a day 2)  Amitriptyline Hcl 75 Mg Tabs (Amitriptyline hcl) .[name] Kitchen.. 1 tablet by mouth at bedtime 3)  Aspirin 81 Mg Chew (Aspirin) .... Take 1 tablet by mouth once a day 4)  Caltrate 600+d Plus 600-400 Mg-unit Tabs (Calcium carbonate-vit d-min) .... Take 1  tablet by mouth three times a day 5)  Neurontin 800 Mg Tabs (Gabapentin) .... Take 1 tablet by mouth four times a day 6)  Singulair 10 Mg Tabs (Montelukast sodium) .... Take 1 tablet by mouth once a day 7)  Trazodone Hcl 100 Mg Tabs (Trazodone hcl) .... Take 1 tablet by mouth at bedtime 8)  Prevacid 30 Mg Cpdr (Lansoprazole) .... One tablet once daily for dyspepsia 9)  Effexor Xr 75 Mg Xr24h-cap (Venlafaxine hcl) .... 2 tabs by mouth daily 10)  Sucralfate 1 Gm Tabs (Sucralfate) .[name] Kitchen.. 1 tab by mouth daily 11)  Oxybutynin Chloride 15 Mg Tb24 (Oxybutynin chloride) .[name] Kitchen.. 1 tab  by mouth daily 12)  Naproxen 375 Mg Tabs (Naproxen) .[name] Kitchen.. 1 tab by mouth two times a day 13)  One Daily Tabs (Multiple vitamin) .[name] Kitchen.. 1 tab daily by mouth 14)  Docusate Sodium 100 Mg Tabs (Docusate sodium) .... 2 tabs by mouth qam and 2 tabs by mouth qpm 15)  Furosemide 20 Mg Tabs (Furosemide) .[name] Kitchen.. 1 tab by mouth qam as needed for leg swelling 16)  Exalgo 12 Mg (hydromorphone Extended Release)  .[name] Kitchen.. 1 tablet by mouth once daily 17)  Toprol Xl 50 Mg Xr24h-tab (Metoprolol succinate) .... One tab by mouth daily 18)  Metanx 3-35-2 Mg Tabs (L-methylfolate-b6-b12) .... Take as directed by pain medicine. 19)  Cetirizine Hcl 10 Mg Tabs (Cetirizine hcl) .[name] Kitchen.. 1 tab by mouth daily 20)  Promethazine Hcl 25 Mg Tabs (Promethazine hcl) .[name] Kitchen.. 1 tab by mouth every 6 hours as needed for nausea 21)  Provera 10 Mg Tabs (Medroxyprogesterone acetate) .[name] Kitchen.. 1 tab daily for the next 5 days 22)  Azithromycin 1 Gm Pack (Azithromycin) .... Take as directed 23)  Diflucan 150 Mg Tabs (Fluconazole) .[name] Kitchen.. 1 tab by mouth for prevention of yeast infection 24)  Guaifenesin-dm 100-10 Mg/29ml Syrp (Dextromethorphan-guaifenesin) .[name] Kitchen.. 1 teaspoon by mouth every 6 hours as needed for cough   Patient Instructions: 1)  I think that you may have bronchitis. 2)  I am going to treat you with an antibiotic for that 3)  If not better in 5-7 days please schedule a follow up  appointment.  Prescriptions: GUAIFENESIN-DM 100-10 MG/5ML SYRP (DEXTROMETHORPHAN-GUAIFENESIN) 1 teaspoon by mouth every 6 hours as needed for cough  #1 x 1   Entered and Authorized by:   Mylinda Latina MD   Signed by:   Mylinda Latina MD on 12/13/2009   Method used:   Electronically to        [facility] (retail)       [address] [place], [place]  [id number]       Ph: [phone]       Fax: [phone]   RxID:   [id number] DIFLUCAN 150 MG TABS (FLUCONAZOLE) 1 tab by mouth for prevention of yeast infection  #1 x 0   Entered and Authorized by:   Mylinda Latina MD   Signed by:   Mylinda Latina MD on 12/13/2009   Method used:   Electronically to        [facility] (retail)       [address] [place], [place]  [id number]       Ph: [phone]       Fax: [phone]   RxID:   [id number] AZITHROMYCIN 1 GM PACK (AZITHROMYCIN) Take as directed  #1 x 0   Entered and Authorized by:   Mylinda Latina MD   Signed by:   Mylinda Latina MD on 12/13/2009   Method used:   Electronically to        [facility] (retail)       [place], [place]  [id number]       Ph: [phone]       Fax: [phone]   RxID:   [id number]

## 2010-02-27 NOTE — Miscellaneous (Signed)
Summary: Chart Summary  Pain medications are handled by pain clinic.  Patient is a strong self-advocate.  Keeps regular appointments.

## 2010-02-27 NOTE — Miscellaneous (Signed)
  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

## 2010-02-27 NOTE — Assessment & Plan Note (Signed)
Summary: migraine and DUB   Vital Signs:  Patient profile:   45 year old female Height:      69 inches Weight:      353.1 pounds BMI:     52.33 Temp:     98.6 degrees F oral Pulse rate:   111 / minute BP sitting:   142 / 81  (left arm) Cuff size:   large  Vitals Entered By: Levert Feinstein LPN (September 28, 624THL 2:52 PM) CC: Migraine and DUB Is Patient Diabetic? No Pain Assessment Patient in pain? no        Primary Care Danyele Smejkal:  Graciella Belton MD  CC:  Migraine and DUB.  History of Present Illness: 1. Migraine - She has had a migraine since Saturday - She normally gets them about once or twice a year - Last one was about 1 year ago - When she gets them, she has usually has to come in for an injection - This is the same as her normal headaches - Located on the left, frontal part of her head - Associated with photophobia, phonophobia, nausea  ROS: denies numbness / weakness, vision changes  2. DUB - She had a similar presentation last year - She had bleeding for 8 weeks - Had PAP smear, Endometrial biopsy, and pelvic ultrasound all of which were normal - Her bleeding improved with Depo injection - She has had bleeding since Saturday - Only using 3 pads a day - It has started to slow down  ROS: denies fatigue, heart racing, vaginal discharge, dysuria  Habits & Providers  Alcohol-Tobacco-Diet     Tobacco Status: quit     Tobacco Counseling: to quit use of tobacco products     Cigarette Packs/Day: 1.5     Year Quit: February 2008     Passive Smoke Exposure: no  Current Medications (verified): 1)  Lotensin Hct 20-12.5 Mg Tab (Benazepril-Hctz) .... Take 1 Tablet By Mouth Once A Day 2)  Amitriptyline Hcl 75 Mg Tabs (Amitriptyline Hcl) .[name] Kitchen.. 1 Tablet By Mouth At Bedtime 3)  Aspirin 81 Mg Chew (Aspirin) .... Take 1 Tablet By Mouth Once A Day 4)  Caltrate 600+d Plus 600-400 Mg-Unit Tabs (Calcium Carbonate-Vit D-Min) .... Take 1 Tablet By Mouth Three Times A Day 5)   Neurontin 800 Mg Tabs (Gabapentin) .... Take 1 Tablet By Mouth Four Times A Day 6)  Singulair 10 Mg Tabs (Montelukast Sodium) .... Take 1 Tablet By Mouth Once A Day 7)  Trazodone Hcl 100 Mg Tabs (Trazodone Hcl) .... Take 1 Tablet By Mouth At Bedtime 8)  Prevacid 30 Mg  Cpdr (Lansoprazole) .... One Tablet Once Daily For Dyspepsia 9)  Effexor Xr 75 Mg  Xr24h-Cap (Venlafaxine Hcl) .... 2 Tabs By Mouth Daily 10)  Sucralfate 1 Gm  Tabs (Sucralfate) .[name] Kitchen.. 1 Tab By Mouth Daily 11)  Oxybutynin Chloride 15 Mg  Tb24 (Oxybutynin Chloride) .[name] Kitchen.. 1 Tab By Mouth Daily 12)  Naproxen 375 Mg  Tabs (Naproxen) .[name] Kitchen.. 1 Tab By Mouth Two Times A Day 13)  One Daily   Tabs (Multiple Vitamin) .[name] Kitchen.. 1 Tab Daily By Mouth 14)  Docusate Sodium 100 Mg  Tabs (Docusate Sodium) .... 2 Tabs By Mouth Qam and 2 Tabs By Mouth Qpm 15)  Furosemide 20 Mg  Tabs (Furosemide) .[name] Kitchen.. 1 Tab By Mouth Qam As Needed For Leg Swelling 16)  Exalgo 12 Mg (Hydromorphone Extended Release) .[name] Kitchen.. 1 Tablet By Mouth Once Daily 17)  Toprol Xl 50 Mg Xr24h-Tab (Metoprolol Succinate) .[name] KitchenMarland KitchenMarland Kitchen  One Tab By Mouth Daily 18)  Metanx 3-35-2 Mg Tabs (L-Methylfolate-B6-B12) .... Take As Directed By Pain Medicine. 19)  Cetirizine Hcl 10 Mg Tabs (Cetirizine Hcl) .[name] Kitchen.. 1 Tab By Mouth Daily 20)  Promethazine Hcl 25 Mg Tabs (Promethazine Hcl) .[name] Kitchen.. 1 Tab By Mouth Every 6 Hours As Needed For Nausea 21)  Provera 10 Mg Tabs (Medroxyprogesterone Acetate) .[name] Kitchen.. 1 Tab Daily For The Next 5 Days  Allergies: 1)  ! Levofloxacin (Levofloxacin) 2)  Methadone Hcl (Methadone Hcl)  Past History:  Past Medical History: Reviewed history from 12/15/2007 and no changes required. h pylori gastritis, tx 6/99,  Left breast infection; 10/01 and 12/01,  small diffuse goiter - no tx per Dwyane Dee 11/04  Other Providers: Dr. [name], [facility] Dr. Doren Custard, [facility], [place] [name], LCSW, Psychotherapy, [address], [place], [place], [address], [phone] Dr. Bernestine Amass, [facility] Dr. Starling Manns, [place]  Social History: Reviewed history from 08/04/2009 and no changes required. Disabled from chronic back pain.  Lives with 2 sons.  No EtOH since 123456, no illicit drugs.  Former EtOH and substance abuse. Smoking Status:  quit  Physical Exam  General:  VITALS:  Reviewed, normal GEN: Morbidly obses.  Alert & oriented, no acute distress CARDIO: Regular rate and rhythm, no murmurs/rubs/gallops, 2+ bilateral radial pulses RESP: Clear to auscultation, normal work of breathing, no retractions/accessory muscle use ABD: Obese, normoactive bowel sounds, nontender, no masses/hepatosplenomegaly Neuro: CNII-XII intact.  5/5 strength in all extremities.  Normal sensation.  No focal deficits.   Impression & Recommendations:  Problem # 1:  MIGRAINE HEADACHE (ICD-346.90) Assessment New Presentation similar to other migraines.  Will give her Toradol injection.  Will also provide Phenergan for nausea at home.  She has an appt with the pain clinic on Monday.  This is also likely hormonal related give her DUB so hopefully the Provera will also help. The following medications were removed from the medication list:    Opana Er 20 Mg Xr12h-tab (Oxymorphone hcl) .[name] Kitchen... 1 every 12 hours Her updated medication list for this problem includes:    Aspirin 81 Mg Chew (Aspirin) .[name] Kitchen... Take 1 tablet by mouth once a day    Naproxen 375 Mg Tabs (Naproxen) .[name] Kitchen... 1 tab by mouth two times a day    Toprol Xl 50 Mg Xr24h-tab (Metoprolol succinate) ..... One tab by mouth daily  Orders: Promethazine up to 50mg  (J2550) Ketorolac-Toradol 15mg  (J1885) [place]- Est  Level 4 [id number])  Problem # 2:  DYSFUNCTIONAL UTERINE BLEEDING (ICD-626.8) Assessment: Deteriorated  Restarted bleeding.  Likely from Estrogen dominance from her obesity.  Will give her Provera to help sluff the uterine lining.  May need monthly progesterone for 5-10  days.  Orders: [facility]- Est  Level 4 [id number])  Complete Medication List: 1)  Lotensin Hct 20-12.5 Mg Tab (Benazepril-hctz) .... Take 1 tablet by mouth once a day 2)  Amitriptyline Hcl 75 Mg Tabs (Amitriptyline hcl) .[name] Kitchen.. 1 tablet by mouth at bedtime 3)  Aspirin 81 Mg Chew (Aspirin) .... Take 1 tablet by mouth once a day 4)  Caltrate 600+d Plus 600-400 Mg-unit Tabs (Calcium carbonate-vit d-min) .... Take 1 tablet by mouth three times a day 5)  Neurontin 800 Mg Tabs (Gabapentin) .... Take 1 tablet by mouth four times a day 6)  Singulair 10 Mg Tabs (Montelukast sodium) .... Take 1 tablet by mouth once a day 7)  Trazodone Hcl 100 Mg Tabs (Trazodone hcl) .[name] KitchenMarland KitchenMarland Kitchen  Take 1 tablet by mouth at bedtime 8)  Prevacid 30 Mg Cpdr (Lansoprazole) .... One tablet once daily for dyspepsia 9)  Effexor Xr 75 Mg Xr24h-cap (Venlafaxine hcl) .... 2 tabs by mouth daily 10)  Sucralfate 1 Gm Tabs (Sucralfate) .[name] Kitchen.. 1 tab by mouth daily 11)  Oxybutynin Chloride 15 Mg Tb24 (Oxybutynin chloride) .[name] Kitchen.. 1 tab by mouth daily 12)  Naproxen 375 Mg Tabs (Naproxen) .[name] Kitchen.. 1 tab by mouth two times a day 13)  One Daily Tabs (Multiple vitamin) .[name] Kitchen.. 1 tab daily by mouth 14)  Docusate Sodium 100 Mg Tabs (Docusate sodium) .... 2 tabs by mouth qam and 2 tabs by mouth qpm 15)  Furosemide 20 Mg Tabs (Furosemide) .[name] Kitchen.. 1 tab by mouth qam as needed for leg swelling 16)  Exalgo 12 Mg (hydromorphone Extended Release)  .[name] Kitchen.. 1 tablet by mouth once daily 17)  Toprol Xl 50 Mg Xr24h-tab (Metoprolol succinate) .... One tab by mouth daily 18)  Metanx 3-35-2 Mg Tabs (L-methylfolate-b6-b12) .... Take as directed by pain medicine. 19)  Cetirizine Hcl 10 Mg Tabs (Cetirizine hcl) .[name] Kitchen.. 1 tab by mouth daily 20)  Promethazine Hcl 25 Mg Tabs (Promethazine hcl) .[name] Kitchen.. 1 tab by mouth every 6 hours as needed for nausea 21)  Provera 10 Mg Tabs (Medroxyprogesterone acetate) .[name] Kitchen.. 1 tab daily for the next 5 days  Patient Instructions: 1)  Hopefully the shot helps you with the  migraine 2)  I have also sent in a prescription for Phenergan for your nausea 3)  For your bleeding I want you to take Provera for the next 5 days.  You may bleed during this time but it should stop after that 4)  Please schedule a follow up appointment in 4 months for a PAP smear Prescriptions: PROVERA 10 MG TABS (MEDROXYPROGESTERONE ACETATE) 1 tab daily for the next 5 days  #5 x 0   Entered and Authorized by:   Mylinda Latina MD   Signed by:   Mylinda Latina MD on 10/25/2009   Method used:   Electronically to        [facility] (retail)       [address] [place], [place]  [id number]       Ph: [phone]       Fax: [phone]   RxID:   [id number] PROMETHAZINE HCL 25 MG TABS (PROMETHAZINE HCL) 1 tab by mouth every 6 hours as needed for nausea  #20 x 0   Entered and Authorized by:   Mylinda Latina MD   Signed by:   Mylinda Latina MD on 10/25/2009   Method used:   Electronically to        [facility] (retail)       [address] [place], [place]  [id number]       Ph: [phone]       Fax: [phone]   RxID:   [id number]    Medication Administration  Injection # 1:    Medication: Ketorolac-Toradol 15mg     Diagnosis: MIGRAINE HEADACHE (ICD-346.90)    Route: IM    Site: RUOQ gluteus    Exp Date: 04/29/2011    Lot #: [id number]    Mfr: [name]    Comments: Patient recieved 60mg  of Toradol    Patient tolerated injection without complications    Given by: Levert Feinstein LPN (September 28, 624THL 3:24 PM)  Injection # 2:    Medication: Promethazine up to  50mg     Diagnosis: MIGRAINE HEADACHE (ICD-346.90)    Route: IM    Site: LUOQ gluteus    Exp Date: 12/29/2010    Lot #: [id number]    Mfr: Novaplus    Comments: Patient recieved 12.5mg  of Phenergan    Patient tolerated injection without complications    Given by: Levert Feinstein LPN (September 28, 624THL 3:24 PM)  Orders Added: 1)  Promethazine up to 50mg  [J2550] 2)   Ketorolac-Toradol 15mg  [J1885] 3)  [facility]- Est  Level 4 [id number]

## 2010-02-27 NOTE — Assessment & Plan Note (Signed)
Summary: f/up,tcb   Vital Signs:  Patient profile:   45 year old female Height:      69 inches Weight:      355 pounds BMI:     52.61 BSA:     2.64 Temp:     98.4 degrees F Pulse rate:   111 / minute BP sitting:   136 / 83  Vitals Entered By: Christen Bame CMA (April 18, 2009 4:31 PM) CC: f/u Is Patient Diabetic? No Pain Assessment Patient in pain? yes     Location: lower back and neck Intensity: 7   Primary Care Provider:  Graciella Belton MD  CC:  f/u.  History of Present Illness: 3 week h/o prurtic rash on groin.  Son has similar.  Aid in house has similar.  No pain.  Using OTC hydrocortisone without relief.  Habits & Providers  Alcohol-Tobacco-Diet     Tobacco Status: current     Tobacco Counseling: to quit use of tobacco products     Cigarette Packs/Day: 0.75  Allergies (verified): 1)  ! Levofloxacin (Levofloxacin) 2)  Methadone Hcl (Methadone Hcl)  Social History: Packs/Day:  0.75  Physical Exam  General:  Well-developed,well-nourished,in no acute distress; alert,appropriate and cooperative throughout examination Skin:  Papular erythematous rash in groin region bilateral.   Impression & Recommendations:  Problem # 1:  SCABIES (ICD-133.0) Assessment New  Permethrin.  Handout provided.  Orders: [place]- Est Level  3 [id number])  Complete Medication List: 1)  Lotensin Hct 20-12.5 Mg Tab (Benazepril-hctz) .... Take 1 tablet by mouth once a day 2)  Amitriptyline Hcl 75 Mg Tabs (Amitriptyline hcl) .[name] Kitchen.. 1 tablet by mouth at bedtime 3)  Aspirin 81 Mg Chew (Aspirin) .... Take 1 tablet by mouth once a day 4)  Caltrate 600+d Plus 600-400 Mg-unit Tabs (Calcium carbonate-vit d-min) .... Take 1 tablet by mouth three times a day 5)  Neurontin 800 Mg Tabs (Gabapentin) .... Take 1 tablet by mouth four times a day 6)  Singulair 10 Mg Tabs (Montelukast sodium) .... Take 1 tablet by mouth once a day 7)  Trazodone Hcl 100 Mg Tabs (Trazodone hcl) .... Take 1 tablet by  mouth at bedtime 8)  Prevacid 30 Mg Cpdr (Lansoprazole) .... One tablet once daily for dyspepsia 9)  Allegra 180 Mg Tabs (Fexofenadine hcl) .[name] Kitchen.. 1 tablet daily.  patient has failed claritin and zyrtec. 10)  Effexor Xr 75 Mg Xr24h-cap (Venlafaxine hcl) .... 2 tabs by mouth daily 11)  Sucralfate 1 Gm Tabs (Sucralfate) .[name] Kitchen.. 1 tab by mouth daily 12)  Oxybutynin Chloride 15 Mg Tb24 (Oxybutynin chloride) .[name] Kitchen.. 1 tab by mouth daily 13)  Naproxen 375 Mg Tabs (Naproxen) .[name] Kitchen.. 1 tab by mouth two times a day 14)  One Daily Tabs (Multiple vitamin) .[name] Kitchen.. 1 tab daily by mouth 15)  Docusate Sodium 100 Mg Tabs (Docusate sodium) .... 2 tabs by mouth qam and 2 tabs by mouth qpm 16)  Depo-provera 150 Mg/ml Im Susp (Medroxyprogesterone acetate) .[name] KitchenMarland KitchenMarland Kitchen 150 mg im q55months 17)  Furosemide 20 Mg Tabs (Furosemide) .[name] Kitchen.. 1 tab by mouth qam as needed for leg swelling 18)  Opana Er 20 Mg Xr12h-tab (Oxymorphone hcl) .[name] Kitchen.. 1 every 12 hours 19)  Exalgo 12 Mg (hydromorphone Extended Release)  .[name] Kitchen.. 1 tablet by mouth once daily 20)  Toprol Xl 50 Mg Xr24h-tab (Metoprolol succinate) .... One tab by mouth daily 21)  Metanx 3-35-2 Mg Tabs (L-methylfolate-b6-b12) .... Take as directed by pain medicine. 22)  Permethrin 5 % Crea (Permethrin) .... Applu to  all areas of body from neck down and wash off after 8-14 hours.  may repeat in 1 week.  disp qs x2 treatments.  Patient Instructions: 1)  I think this is scabies.  Use Permethrin as described below (Rx has been sent). 2)  Please schedule a follow-up appointment in 2 weeks if not better..  Prescriptions: PERMETHRIN 5 % CREA (PERMETHRIN) Applu to all areas of body from neck down and wash off after 8-14 hours.  May repeat in 1 week.  Disp QS x2 treatments.  #1 x 1   Entered and Authorized by:   Graciella Belton MD   Signed by:   Graciella Belton MD on 04/18/2009   Method used:   Electronically to        [facility] (retail)       [place], [place]   [id number]       Ph: [phone]       Fax: [phone]   RxID:   [id number]

## 2010-02-27 NOTE — Assessment & Plan Note (Signed)
Summary: f/u scabies   Vital Signs:  Patient profile:   45 year old female Height:      69 inches Weight:      353.8 pounds BMI:     52.44 Temp:     98.3 degrees F oral Pulse rate:   109 / minute BP sitting:   123 / 82  (right arm) Cuff size:   regular  Vitals Entered By: Levert Feinstein LPN (April  5, 624THL [id number] PM) CC: f/u scabies Is Patient Diabetic? No Pain Assessment Patient in pain? no        Primary Care Provider:  Graciella Belton MD  CC:  f/u scabies.  History of Present Illness: Seen 03/22 and diagnosed with Scabies.  Rx'd Permethrin.  Used the Permethrin and mixed it with OTC Hydrocortisone.  Feels much better.  Denis pruritus.  Habits & Providers  Alcohol-Tobacco-Diet     Tobacco Status: current     Cigarette Packs/Day: 1.0  Allergies (verified): 1)  ! Levofloxacin (Levofloxacin) 2)  Methadone Hcl (Methadone Hcl)  Social History: Packs/Day:  1.0  Physical Exam  General:  Well-developed,well-nourished,in no acute distress; alert,appropriate and cooperative throughout examination Skin:  Intact without suspicious lesions or rashes   Impression & Recommendations:  Problem # 1:  SCABIES (ICD-133.0) Assessment Improved  Resolved.  Orders: [facility]- Est Level  2 [id number])  Complete Medication List: 1)  Lotensin Hct 20-12.5 Mg Tab (Benazepril-hctz) .... Take 1 tablet by mouth once a day 2)  Amitriptyline Hcl 75 Mg Tabs (Amitriptyline hcl) .[name] Kitchen.. 1 tablet by mouth at bedtime 3)  Aspirin 81 Mg Chew (Aspirin) .... Take 1 tablet by mouth once a day 4)  Caltrate 600+d Plus 600-400 Mg-unit Tabs (Calcium carbonate-vit d-min) .... Take 1 tablet by mouth three times a day 5)  Neurontin 800 Mg Tabs (Gabapentin) .... Take 1 tablet by mouth four times a day 6)  Singulair 10 Mg Tabs (Montelukast sodium) .... Take 1 tablet by mouth once a day 7)  Trazodone Hcl 100 Mg Tabs (Trazodone hcl) .... Take 1 tablet by mouth at bedtime 8)  Prevacid 30 Mg Cpdr (Lansoprazole) .... One  tablet once daily for dyspepsia 9)  Allegra 180 Mg Tabs (Fexofenadine hcl) .[name] Kitchen.. 1 tablet daily.  patient has failed claritin and zyrtec. 10)  Effexor Xr 75 Mg Xr24h-cap (Venlafaxine hcl) .... 2 tabs by mouth daily 11)  Sucralfate 1 Gm Tabs (Sucralfate) .[name] Kitchen.. 1 tab by mouth daily 12)  Oxybutynin Chloride 15 Mg Tb24 (Oxybutynin chloride) .[name] Kitchen.. 1 tab by mouth daily 13)  Naproxen 375 Mg Tabs (Naproxen) .[name] Kitchen.. 1 tab by mouth two times a day 14)  One Daily Tabs (Multiple vitamin) .[name] Kitchen.. 1 tab daily by mouth 15)  Docusate Sodium 100 Mg Tabs (Docusate sodium) .... 2 tabs by mouth qam and 2 tabs by mouth qpm 16)  Depo-provera 150 Mg/ml Im Susp (Medroxyprogesterone acetate) .[name] KitchenMarland KitchenMarland Kitchen 150 mg im q17months 17)  Furosemide 20 Mg Tabs (Furosemide) .[name] Kitchen.. 1 tab by mouth qam as needed for leg swelling 18)  Opana Er 20 Mg Xr12h-tab (Oxymorphone hcl) .[name] Kitchen.. 1 every 12 hours 19)  Exalgo 12 Mg (hydromorphone Extended Release)  .[name] Kitchen.. 1 tablet by mouth once daily 20)  Toprol Xl 50 Mg Xr24h-tab (Metoprolol succinate) .... One tab by mouth daily 21)  Metanx 3-35-2 Mg Tabs (L-methylfolate-b6-b12) .... Take as directed by pain medicine. 22)  Permethrin 5 % Crea (Permethrin) .... Applu to all areas of body from neck down and wash off after 8-14 hours.  may  repeat in 1 week.  disp qs x2 treatments.

## 2010-02-27 NOTE — Progress Notes (Signed)
Summary: Req alternate rx  Phone Note Refill Request Call back at Home Phone [phone]   pt sts medicaid no longer pays for allegra, pt would like an alternate rx.   Initial call taken by: Samara Snide,  August 09, 2009 10:41 AM  Follow-up for Phone Call        changed to Zyrtec Follow-up by: Mylinda Latina MD,  August 09, 2009 2:34 PM    New/Updated Medications: CETIRIZINE HCL 10 MG TABS (CETIRIZINE HCL) 1 tab by mouth daily Prescriptions: CETIRIZINE HCL 10 MG TABS (CETIRIZINE HCL) 1 tab by mouth daily  #30 x 3   Entered and Authorized by:   Mylinda Latina MD   Signed by:   Mylinda Latina MD on 08/09/2009   Method used:   Electronically to        [facility] (retail)       [place], [place]  [id number]       Ph: [phone]       Fax: [phone]   RxID:   [id number]

## 2010-02-27 NOTE — Progress Notes (Signed)
Summary: CT Scan and Lab Results -- Mother Just Died  Phone Note Outgoing Call   Call placed by: Graciella Belton MD,  March 17, 2009 5:01 PM Call placed to: Patient Summary of Call: Called to inform CT and labs wnl, urine culture pending.  Will send Bactrim to cover UTI (levofloxacine allergic).  Ms. Zaske mother died suddenly this morning after knee surgery.  She is quite distressed at this time.  Requests a benzo.  Will also send 1 weeks' supply Ativan to pharmacy.  Should be seen for more refills.    New/Updated Medications: SMZ-TMP DS 800-160 MG TABS (SULFAMETHOXAZOLE-TRIMETHOPRIM) 1 tab by mouth two times a day x3 days ATIVAN 0.5 MG TABS (LORAZEPAM) 1 tab by mouth two times a day as needed for anxiety Prescriptions: ATIVAN 0.5 MG TABS (LORAZEPAM) 1 tab by mouth two times a day as needed for anxiety  #14 x 0   Entered and Authorized by:   Graciella Belton MD   Signed by:   Graciella Belton MD on 03/17/2009   Method used:   Printed then faxed to ...       [facility] (retail)       [address] [place], [place]  [id number]       Ph: [phone]       Fax: [phone]   RxID:   [id number] SMZ-TMP DS 800-160 MG TABS (SULFAMETHOXAZOLE-TRIMETHOPRIM) 1 tab by mouth two times a day x3 days  #6 x 0   Entered and Authorized by:   Graciella Belton MD   Signed by:   Graciella Belton MD on 03/17/2009   Method used:   Electronically to        [facility] (retail)       [place], [place]  [id number]       Ph: [phone]       Fax: [phone]   RxID:   [id number]

## 2010-02-27 NOTE — Assessment & Plan Note (Signed)
Summary: not any better,df   Vital Signs:  Patient profile:   45 year old female Height:      69 inches Weight:      345.06 pounds BMI:     51.14 BSA:     2.61 Temp:     98.7 degrees F Pulse rate:   94 / minute BP sitting:   153 / 99  Vitals Entered By: Christen Bame CMA (December 20, 2009 10:25 AM) CC: not feeling any better Is Patient Diabetic? No Pain Assessment Patient in pain? no        Primary Care Provider:  Graciella Belton MD  CC:  not feeling any better.  History of Present Illness: 1. Cold / Congestion:  Pt has been feeling bad since 11-4.  Her granddaughter had some form of infection, and possible strep throat and was given some antibiotics.  She thinks that she got it from her.  She has been getting worse.  Her main symptoms include productive cough, back pain, chills, and diarrhea.  She was seen in clinic last week and given a Rx of Azithromycin.  She only got 2 pills though.  This didn't help.  She has been using the cough syrup which helps some.  ROS: endorses sinus pain / pressure. denies sore throat, n/v  2. HTN:  She hasn't been taking her blood pressure medicine because she feels so bad.  ROS: denies chest pain, shortness of breath  3. Tobacco use:  She has continued to smoke through all of this.  She is not interested in quitting.   Habits & Providers  Alcohol-Tobacco-Diet     Tobacco Status: current     Tobacco Counseling: to quit use of tobacco products     Cigarette Packs/Day: 1.0     Year Quit: February 2008     Passive Smoke Exposure: no  Current Medications (verified): 1)  Lotensin Hct 20-12.5 Mg Tab (Benazepril-Hctz) .... Take 1 Tablet By Mouth Once A Day 2)  Amitriptyline Hcl 75 Mg Tabs (Amitriptyline Hcl) .[name] Kitchen.. 1 Tablet By Mouth At Bedtime 3)  Aspirin 81 Mg Chew (Aspirin) .... Take 1 Tablet By Mouth Once A Day 4)  Caltrate 600+d Plus 600-400 Mg-Unit Tabs (Calcium Carbonate-Vit D-Min) .... Take 1 Tablet By Mouth Three Times A Day 5)   Neurontin 800 Mg Tabs (Gabapentin) .... Take 1 Tablet By Mouth Four Times A Day 6)  Singulair 10 Mg Tabs (Montelukast Sodium) .... Take 1 Tablet By Mouth Once A Day 7)  Trazodone Hcl 100 Mg Tabs (Trazodone Hcl) .... Take 1 Tablet By Mouth At Bedtime 8)  Prevacid 30 Mg  Cpdr (Lansoprazole) .... One Tablet Once Daily For Dyspepsia 9)  Effexor Xr 75 Mg  Xr24h-Cap (Venlafaxine Hcl) .... 2 Tabs By Mouth Daily 10)  Sucralfate 1 Gm  Tabs (Sucralfate) .[name] Kitchen.. 1 Tab By Mouth Daily 11)  Oxybutynin Chloride 15 Mg  Tb24 (Oxybutynin Chloride) .[name] Kitchen.. 1 Tab By Mouth Daily 12)  Furosemide 20 Mg  Tabs (Furosemide) .[name] Kitchen.. 1 Tab By Mouth Qam As Needed For Leg Swelling 13)  Toprol Xl 50 Mg Xr24h-Tab (Metoprolol Succinate) .... One Tab By Mouth Daily 14)  Cetirizine Hcl 10 Mg Tabs (Cetirizine Hcl) .[name] Kitchen.. 1 Tab By Mouth Daily 15)  Promethazine Hcl 25 Mg Tabs (Promethazine Hcl) .[name] Kitchen.. 1 Tab By Mouth Every 6 Hours As Needed For Nausea 16)  Provera 10 Mg Tabs (Medroxyprogesterone Acetate) .[name] Kitchen.. 1 Tab Daily For The Next 5 Days 17)  Diflucan 150 Mg Tabs (Fluconazole) .[name] KitchenMarland KitchenMarland Kitchen  1 Tab By Mouth For Prevention of Yeast Infection 18)  Tussionex Pennkinetic Er 10-8 Mg/50ml Lqcr (Hydrocod Polst-Chlorphen Polst) .... 5 Ml Every 12 Hours As Needed For Cough Dispo: Qs For 2 Weeks 19)  Doxycycline Hyclate 100 Mg Caps (Doxycycline Hyclate) .[name] Kitchen.. 1 Tab By Mouth Twice A Day For 10 Days  Allergies: 1)  ! Levofloxacin (Levofloxacin) 2)  Methadone Hcl (Methadone Hcl)  Social History: Reviewed history from 12/13/2009 and no changes required. Disabled from chronic back pain.  Lives with 2 sons.  No EtOH since 123456, no illicit drugs.  Former EtOH and substance abuse.   Smokes 1 ppd.  Physical Exam  General:  VITALS:  Reviewed, normal GEN: Morbidly obsese.  Alert & oriented, no acute distress CARDIO: Regular rate and rhythm, no murmurs/rubs/gallops, 2+ bilateral radial pulses RESP: Clear to auscultation, normal work of breathing, no  retractions/accessory muscle use.  No consolidation. ABD: Obese, normoactive bowel sounds, nontender, no masses/hepatosplenomegaly Neuro:  No focal deficits. Ext: no LE edema   Impression & Recommendations:  Problem # 1:  ACUTE BRONCHITIS (ICD-466.0) Assessment Deteriorated  Not improved.  She didn't get a full course of the Azithromycin.  Will treat with Doxycycline.  She did not want to get a chest x-ray. The following medications were removed from the medication list:    Azithromycin 1 Gm Pack (Azithromycin) .[name] Kitchen... Take as directed Her updated medication list for this problem includes:    Singulair 10 Mg Tabs (Montelukast sodium) .[name] Kitchen... Take 1 tablet by mouth once a day    Tussionex Pennkinetic Er 10-8 Mg/29ml Lqcr (Hydrocod polst-chlorphen polst) .[name] KitchenMarland KitchenMarland KitchenMarland Kitchen 5 ml every 12 hours as needed for cough dispo: qs for 2 weeks    Doxycycline Hyclate 100 Mg Caps (Doxycycline hyclate) .[name] Kitchen... 1 tab by mouth twice a day for 10 days  Orders: [facility]- Est  Level 4 [id number])  Problem # 2:  ESSENTIAL HYPERTENSION (ICD-401.9) Assessment: Unchanged  Will recheck when she is feeling better and taking her medicines. Her updated medication list for this problem includes:    Lotensin Hct 20-12.5 Mg Tab (Benazepril-hctz) .[name] Kitchen... Take 1 tablet by mouth once a day    Furosemide 20 Mg Tabs (Furosemide) .[name] Kitchen... 1 tab by mouth qam as needed for leg swelling    Toprol Xl 50 Mg Xr24h-tab (Metoprolol succinate) ..... One tab by mouth daily  Orders: [place]- Est  Level 4 [id number])  Problem # 3:  TOBACCO DEPENDENCE (ICD-305.1) Assessment: Unchanged  Continues to smoke.  Advised her that this was the most important thing for her health.  Orders: [facility]- Est  Level 4 [id number])  Complete Medication List: 1)  Lotensin Hct 20-12.5 Mg Tab (Benazepril-hctz) .... Take 1 tablet by mouth once a day 2)  Amitriptyline Hcl 75 Mg Tabs (Amitriptyline hcl) .[name] Kitchen.. 1 tablet by mouth at bedtime 3)  Aspirin 81 Mg Chew (Aspirin) .... Take 1 tablet by  mouth once a day 4)  Caltrate 600+d Plus 600-400 Mg-unit Tabs (Calcium carbonate-vit d-min) .... Take 1 tablet by mouth three times a day 5)  Neurontin 800 Mg Tabs (Gabapentin) .... Take 1 tablet by mouth four times a day 6)  Singulair 10 Mg Tabs (Montelukast sodium) .... Take 1 tablet by mouth once a day 7)  Trazodone Hcl 100 Mg Tabs (Trazodone hcl) .... Take 1 tablet by mouth at bedtime 8)  Prevacid 30 Mg Cpdr (Lansoprazole) .... One tablet once daily for dyspepsia 9)  Effexor Xr 75 Mg Xr24h-cap (Venlafaxine hcl) .... 2 tabs by mouth daily 10)  Sucralfate 1 Gm Tabs (Sucralfate) .[name] Kitchen.. 1 tab by mouth daily 11)  Oxybutynin Chloride 15 Mg Tb24 (Oxybutynin chloride) .[name] Kitchen.. 1 tab by mouth daily 12)  Furosemide 20 Mg Tabs (Furosemide) .[name] Kitchen.. 1 tab by mouth qam as needed for leg swelling 13)  Toprol Xl 50 Mg Xr24h-tab (Metoprolol succinate) .... One tab by mouth daily 14)  Cetirizine Hcl 10 Mg Tabs (Cetirizine hcl) .[name] Kitchen.. 1 tab by mouth daily 15)  Promethazine Hcl 25 Mg Tabs (Promethazine hcl) .[name] Kitchen.. 1 tab by mouth every 6 hours as needed for nausea 16)  Provera 10 Mg Tabs (Medroxyprogesterone acetate) .[name] Kitchen.. 1 tab daily for the next 5 days 17)  Diflucan 150 Mg Tabs (Fluconazole) .[name] Kitchen.. 1 tab by mouth for prevention of yeast infection 18)  Tussionex Pennkinetic Er 10-8 Mg/17ml Lqcr (Hydrocod polst-chlorphen polst) .... 5 ml every 12 hours as needed for cough dispo: qs for 2 weeks 19)  Doxycycline Hyclate 100 Mg Caps (Doxycycline hyclate) .[name] Kitchen.. 1 tab by mouth twice a day for 10 days  Patient Instructions: 1)  I still think that it is bronchitis 2)  I'm sorry that there was confusion about your last antibiotic 3)  We will try Doxycycline for 10 days 4)  This should get you feeling better 5)  Please schedule a follow up appointment in 7 days if not better 6)  We will probably need to do an x-ray then if not better Prescriptions: DOXYCYCLINE HYCLATE 100 MG CAPS (DOXYCYCLINE HYCLATE) 1 tab by mouth twice a day for  10 days  #20 x 0   Entered and Authorized by:   Mylinda Latina MD   Signed by:   Mylinda Latina MD on 12/20/2009   Method used:   Electronically to        [facility] (retail)       [address], [place]  [id number]       Ph: [phone]       Fax: [phone]   RxID:   [id number]    Orders Added: 1)  [facility]- Est  Level 4 [id number]

## 2010-02-27 NOTE — Assessment & Plan Note (Signed)
Summary: urinary incontinence   Vital Signs:  Patient profile:   45 year old female Height:      69 inches Weight:      341.7 pounds BMI:     50.64 Temp:     98.5 degrees F oral Pulse rate:   111 / minute BP sitting:   118 / 81  (left arm) Cuff size:   large  Vitals Entered By: Levert Feinstein LPN (June 30, 624THL 624THL AM) CC: urinary incontinence Is Patient Diabetic? No   Primary Care Provider:  Graciella Belton MD  CC:  urinary incontinence.  History of Present Illness: 45 yo female presenting for 2 weeks of nocturia and daytime urinary incontinence.  This issue started for  her approximately 3 years ago and improved after neck surgery and she has been on Oxybutinn which has provided good relief.  Currently having to urinate approximately 5-6 times per night and occasionally is having incontinence during the day.  She denies fevers, abdominal pain, LE weakness and numbness, saddle anesthesia.  She previously saw Dr. Risa Grill at [facility] and today requests referral back to him.  Habits & Providers  Alcohol-Tobacco-Diet     Tobacco Status: current     Tobacco Counseling: to quit use of tobacco products  Allergies (verified): 1)  ! Levofloxacin (Levofloxacin) 2)  Methadone Hcl (Methadone Hcl)  Review of Systems       Per HPI.  Physical Exam  Additional Exam:  VITALS:  Reviewed, normal GEN: Alert & oriented, no acute distress CARDIO: Regular rate and rhythm, no murmurs/rubs/gallops, 2+ bilateral radial pulses RESP: Clear to auscultation, normal work of breathing, no retractions/accessory muscle use ABD: Obese, normoactive bowel sounds, nontender, no masses/hepatosplenomegaly    Impression & Recommendations:  Problem # 1:  NEUROGENIC BLADDER (ICD-344.61) Assessment Deteriorated Nocturia and incontinence increasing the past 2 weeks.  Normal U/A today.  Patient requests referral to urology--will arrange. Orders: [facility]- Est Level  3 [id number]) Urology Referral  (Urology)  Complete Medication List: 1)  Lotensin Hct 20-12.5 Mg Tab (Benazepril-hctz) .... Take 1 tablet by mouth once a day 2)  Amitriptyline Hcl 75 Mg Tabs (Amitriptyline hcl) .[name] Kitchen.. 1 tablet by mouth at bedtime 3)  Aspirin 81 Mg Chew (Aspirin) .... Take 1 tablet by mouth once a day 4)  Caltrate 600+d Plus 600-400 Mg-unit Tabs (Calcium carbonate-vit d-min) .... Take 1 tablet by mouth three times a day 5)  Neurontin 800 Mg Tabs (Gabapentin) .... Take 1 tablet by mouth four times a day 6)  Singulair 10 Mg Tabs (Montelukast sodium) .... Take 1 tablet by mouth once a day 7)  Trazodone Hcl 100 Mg Tabs (Trazodone hcl) .... Take 1 tablet by mouth at bedtime 8)  Prevacid 30 Mg Cpdr (Lansoprazole) .... One tablet once daily for dyspepsia 9)  Allegra 180 Mg Tabs (Fexofenadine hcl) .[name] Kitchen.. 1 tablet daily.  patient has failed claritin and zyrtec. 10)  Effexor Xr 75 Mg Xr24h-cap (Venlafaxine hcl) .... 2 tabs by mouth daily 11)  Sucralfate 1 Gm Tabs (Sucralfate) .[name] Kitchen.. 1 tab by mouth daily 12)  Oxybutynin Chloride 15 Mg Tb24 (Oxybutynin chloride) .[name] Kitchen.. 1 tab by mouth daily 13)  Naproxen 375 Mg Tabs (Naproxen) .[name] Kitchen.. 1 tab by mouth two times a day 14)  One Daily Tabs (Multiple vitamin) .[name] Kitchen.. 1 tab daily by mouth 15)  Docusate Sodium 100 Mg Tabs (Docusate sodium) .... 2 tabs by mouth qam and 2 tabs by mouth qpm 16)  Depo-provera 150 Mg/ml Im Susp (Medroxyprogesterone acetate) .[name] KitchenMarland KitchenMarland Kitchen  150 mg im q1months 17)  Furosemide 20 Mg Tabs (Furosemide) .[name] Kitchen.. 1 tab by mouth qam as needed for leg swelling 18)  Opana Er 20 Mg Xr12h-tab (Oxymorphone hcl) .[name] Kitchen.. 1 every 12 hours 19)  Exalgo 12 Mg (hydromorphone Extended Release)  .[name] Kitchen.. 1 tablet by mouth once daily 20)  Toprol Xl 50 Mg Xr24h-tab (Metoprolol succinate) .... One tab by mouth daily 21)  Metanx 3-35-2 Mg Tabs (L-methylfolate-b6-b12) .... Take as directed by pain medicine.  Other Orders: Urinalysis-[facility] (00000)  Patient Instructions: 1)  Pleasure to see you today! 2)   We'll call with urology appointment. 3)  Please schedule a follow-up appointment in 1 month with new doctor.  Laboratory Results   Urine Tests  Date/Time Received: July 27, 2009 8:59 AM  Date/Time Reported: July 27, 2009 9:16 AM   Routine Urinalysis   Color: yellow Appearance: Clear Glucose: negative   (Normal Range: Negative) Bilirubin: negative   (Normal Range: Negative) Ketone: negative   (Normal Range: Negative) Spec. Gravity: 1.010   (Normal Range: 1.003-1.035) Blood: negative   (Normal Range: Negative) pH: 6.5   (Normal Range: 5.0-8.0) Protein: negative   (Normal Range: Negative) Urobilinogen: 0.2   (Normal Range: 0-1) Nitrite: negative   (Normal Range: Negative) Leukocyte Esterace: negative   (Normal Range: Negative)    Comments: ...............test performed by......[name] KitchenBonnie A. Martinique, MLS (ASCP)cm

## 2010-02-27 NOTE — Progress Notes (Signed)
Summary: Knee Xray, Hair KOH negative  Phone Note Outgoing Call Call back at [facility] Phone [phone]   Call placed by: Graciella Belton MD,  Jun 14, 2009 6:54 PM Call placed to: Patient Summary of Call: Left voicemail.  Advised that knee xray shows mild arthritis.  She should use ice and the pain meds she gets from the pain clinic.  RTC if pain gets worse or leg gives out again.  Also advised that KOH of hair was negative but that we are running another test to see if she has ringworm and will let her know the results.  Asked to call clinic with questions.

## 2010-02-27 NOTE — Progress Notes (Signed)
Summary: f/u Urinary Incontinence  Phone Note Outgoing Call Call back at Home Phone [phone]   Call placed by: Graciella Belton MD,  July 27, 2009 2:57 PM Call placed to: Patient Summary of Call: Called to f/u urinary incontinence with patient.  Visit this morning was rushed as I was running late and she needed to leave.  She is urinating in the bed at night then has a lot of urine to pass when she goes to the toilet.  These certainly sound like overflow incontinence symptoms and this is a patient with multiple cervical and lumbar spine surgeries in the past, so this is concerning for spinal symptoms, though she again denies fecal incontinence or constipation, LE weakness and numbness, saddle anesthesia.  I have discussed my concern with Ms. Wieand and advised that I think we should get an MRI of the lumbar and sacral spine to evaluate.  I have precepted with Dr. Lindell Noe who is in agreement with this plan.  Patient agrees to this.  We discussed red flags that should prompt immediate presentation to the Emergency Room:  lower extremity weaknes or numbness, saddle anesthesia, worsening urinary symptoms, development of fecal incontinence or constipation.  Patient verbalizes understanding.  I asked patient to follow up with Dr. Tye Savoy promptly after the MRI is done and to move her curently scheduled appointment sooner.

## 2010-02-27 NOTE — Consult Note (Signed)
Summary: [facility]   Imported By: Audie Clear 10/10/2009 16:14:36  _____________________________________________________________________  External Attachment:    Type:   Image     Comment:   External Document

## 2010-02-27 NOTE — Assessment & Plan Note (Signed)
Summary: f/up,tcb   Vital Signs:  Patient profile:   45 year old female Height:      69 inches Weight:      357 pounds BMI:     52.91 BSA:     2.64 Temp:     98.3 degrees F Pulse rate:   116 / minute BP sitting:   146 / 82  Vitals Entered By: Christen Bame CMA (Jun 14, 2009 10:14 AM) CC: f/u Is Patient Diabetic? No Pain Assessment Patient in pain? yes     Location: lower back and left knee Intensity: 6   Primary Care Provider:  Graciella Belton MD  CC:  f/u.  History of Present Illness: BANDAGE CHECK S/P NECK SURGERY.  Needs clean gauze bandage with antibiotic ointment on wound on back of neck.  Neck surgery was 04/24/2009, followed up with surgeon this morning.  Wound is healing by secondary intention and surgeon believes it is healing well.  No pus, fevers, some wound site pain.  LEFT KNEE PAIN.  Fell on knee twice after neck surgery in March.  Knee had given out.  No knee pain at the time, but now (duration 3 weeks) knee locks up several times a weak, intermittent (2-3 times per week) 10/10 pain, currently no pain.  Knee is swollen.  Not using ice or medicines for it.  VAGINAL DISCHARGE.  Duration 4 days.  Itchy, bad smell.  Not sexually active.  No abdominal pain.  No fevers, nausea, vomiting, dysuria.  PATCH ON HEAD.  Just noticed in clinic today.  LEFT temoral region alopecia patch.  Not pruritic or painful.  Habits & Providers  Alcohol-Tobacco-Diet     Tobacco Status: current     Tobacco Counseling: to quit use of tobacco products     Cigarette Packs/Day: 1.5  Current Medications (verified): 1)  Lotensin Hct 20-12.5 Mg Tab (Benazepril-Hctz) .... Take 1 Tablet By Mouth Once A Day 2)  Amitriptyline Hcl 75 Mg Tabs (Amitriptyline Hcl) .[name] Kitchen.. 1 Tablet By Mouth At Bedtime 3)  Aspirin 81 Mg Chew (Aspirin) .... Take 1 Tablet By Mouth Once A Day 4)  Caltrate 600+d Plus 600-400 Mg-Unit Tabs (Calcium Carbonate-Vit D-Min) .... Take 1 Tablet By Mouth Three Times A Day 5)   Neurontin 800 Mg Tabs (Gabapentin) .... Take 1 Tablet By Mouth Four Times A Day 6)  Singulair 10 Mg Tabs (Montelukast Sodium) .... Take 1 Tablet By Mouth Once A Day 7)  Trazodone Hcl 100 Mg Tabs (Trazodone Hcl) .... Take 1 Tablet By Mouth At Bedtime 8)  Prevacid 30 Mg  Cpdr (Lansoprazole) .... One Tablet Once Daily For Dyspepsia 9)  Allegra 180 Mg  Tabs (Fexofenadine Hcl) .[name] Kitchen.. 1 Tablet Daily.  Patient Has Failed Claritin and Zyrtec. 10)  Effexor Xr 75 Mg  Xr24h-Cap (Venlafaxine Hcl) .... 2 Tabs By Mouth Daily 11)  Sucralfate 1 Gm  Tabs (Sucralfate) .[name] Kitchen.. 1 Tab By Mouth Daily 12)  Oxybutynin Chloride 15 Mg  Tb24 (Oxybutynin Chloride) .[name] Kitchen.. 1 Tab By Mouth Daily 13)  Naproxen 375 Mg  Tabs (Naproxen) .[name] Kitchen.. 1 Tab By Mouth Two Times A Day 14)  One Daily   Tabs (Multiple Vitamin) .[name] Kitchen.. 1 Tab Daily By Mouth 15)  Docusate Sodium 100 Mg  Tabs (Docusate Sodium) .... 2 Tabs By Mouth Qam and 2 Tabs By Mouth Qpm 16)  Depo-Provera 150 Mg/ml Im Susp (Medroxyprogesterone Acetate) .[name] KitchenMarland KitchenMarland Kitchen 150 Mg Im Q75months 17)  Furosemide 20 Mg  Tabs (Furosemide) .[name] Kitchen.. 1 Tab By Mouth Qam As Needed  For Leg Swelling 18)  Opana Er 20 Mg Xr12h-Tab (Oxymorphone Hcl) .[name] Kitchen.. 1 Every 12 Hours 19)  Exalgo 12 Mg (Hydromorphone Extended Release) .[name] Kitchen.. 1 Tablet By Mouth Once Daily 20)  Toprol Xl 50 Mg Xr24h-Tab (Metoprolol Succinate) .... One Tab By Mouth Daily 21)  Metanx 3-35-2 Mg Tabs (L-Methylfolate-B6-B12) .... Take As Directed By Pain Medicine. 22)  Fluconazole 150 Mg Tabs (Fluconazole) .[name] Kitchen.. 1 Tab By Mouth Once For Yeast Infection  Allergies (verified): 1)  ! Levofloxacin (Levofloxacin) 2)  Methadone Hcl (Methadone Hcl)  Social History: Packs/Day:  1.5  Physical Exam  General:  Alert and oriented x3, obese, no acute distress Lungs:  Normal respiratory effort, chest expands symmetrically. Lungs are clear to auscultation, no crackles or wheezes. Heart:  Normal rate and regular rhythm. S1 and S2 normal without gallop, murmur, click, rub or  other extra sounds. Genitalia:  Patient declines. Msk:  LEFT KNEE:   Difficult exam secondary to body habitus.  FROM, +joint line tenderness, negative anterior drawer.  No obvious swelling.  No erythema. Skin:  Posterior neck:  1 x 2 surgical wound healing by secondary intention.  No pus, erythema.  Appears to be healing well.  3 cm alopecia on LEFT temporal region.  No erythema or broken hairs.   Impression & Recommendations:  Problem # 1:  KNEE PAIN, LEFT, ACUTE (ICD-719.46) Assessment New Likely arthritis.  Pain is intermittent.  For now will check Xray.  Pain control is already managed by pain clinic for multiple other pain complaints. Her updated medication list for this problem includes:    Aspirin 81 Mg Chew (Aspirin) .[name] Kitchen... Take 1 tablet by mouth once a day    Naproxen 375 Mg Tabs (Naproxen) .[name] Kitchen... 1 tab by mouth two times a day    Opana Er 20 Mg Xr12h-tab (Oxymorphone hcl) .[name] Kitchen... 1 every 12 hours  Orders: Diagnostic X-Ray/Fluoroscopy (Diagnostic X-Ray/Flu) [place]- Est  Level 4 [id number])  Problem # 2:  VAGINAL DISCHARGE (ICD-623.5) Assessment: New  Symptoms consistent with yeast infection.  No red flags (abdominal pain, fever, nausea), declines sexual activity, and patient is a good follow up candidate.  She declines pelvic exam today.  Reasonable to give 1x dose of Fluconazole for presumed yeast infection.  Red flags given and advised to return to clinic in 2 days if discharge persists.  Patient verbalizes agreement.  Orders: [place]- Est  Level 4 ([id number])  Problem # 3:  ALOPECIA (ICD-704.00) Assessment: New  Presentation is consistent with tinea capitus.  Have removed several hair shafts and sent to lab for microscopic evaluation.  Treatment deferred for now.  Orders: [place]- Est  Level 4 (99214)  Problem # 4:  ENCOUNTER CHANGE/REMOVAL SURGICAL WOUND DRESSING (ICD-V58.31) Assessment: Comment Only  Sterile dressing with antibiotic ointment applied.  She is followed by surgeon for  this.  Healing well.  Orders: [facility]- Est  Level 4 [id number])  Complete Medication List: 1)  Lotensin Hct 20-12.5 Mg Tab (Benazepril-hctz) .... Take 1 tablet by mouth once a day 2)  Amitriptyline Hcl 75 Mg Tabs (Amitriptyline hcl) .[name] Kitchen.. 1 tablet by mouth at bedtime 3)  Aspirin 81 Mg Chew (Aspirin) .... Take 1 tablet by mouth once a day 4)  Caltrate 600+d Plus 600-400 Mg-unit Tabs (Calcium carbonate-vit d-min) .... Take 1 tablet by mouth three times a day 5)  Neurontin 800 Mg Tabs (Gabapentin) .... Take 1 tablet by mouth four times a day 6)  Singulair 10 Mg Tabs (Montelukast sodium) .... Take 1 tablet by mouth once  a day 7)  Trazodone Hcl 100 Mg Tabs (Trazodone hcl) .... Take 1 tablet by mouth at bedtime 8)  Prevacid 30 Mg Cpdr (Lansoprazole) .... One tablet once daily for dyspepsia 9)  Allegra 180 Mg Tabs (Fexofenadine hcl) .[name] Kitchen.. 1 tablet daily.  patient has failed claritin and zyrtec. 10)  Effexor Xr 75 Mg Xr24h-cap (Venlafaxine hcl) .... 2 tabs by mouth daily 11)  Sucralfate 1 Gm Tabs (Sucralfate) .[name] Kitchen.. 1 tab by mouth daily 12)  Oxybutynin Chloride 15 Mg Tb24 (Oxybutynin chloride) .[name] Kitchen.. 1 tab by mouth daily 13)  Naproxen 375 Mg Tabs (Naproxen) .[name] Kitchen.. 1 tab by mouth two times a day 14)  One Daily Tabs (Multiple vitamin) .[name] Kitchen.. 1 tab daily by mouth 15)  Docusate Sodium 100 Mg Tabs (Docusate sodium) .... 2 tabs by mouth qam and 2 tabs by mouth qpm 16)  Depo-provera 150 Mg/ml Im Susp (Medroxyprogesterone acetate) .[name] KitchenMarland KitchenMarland Kitchen 150 mg im q61months 17)  Furosemide 20 Mg Tabs (Furosemide) .[name] Kitchen.. 1 tab by mouth qam as needed for leg swelling 18)  Opana Er 20 Mg Xr12h-tab (Oxymorphone hcl) .[name] Kitchen.. 1 every 12 hours 19)  Exalgo 12 Mg (hydromorphone Extended Release)  .[name] Kitchen.. 1 tablet by mouth once daily 20)  Toprol Xl 50 Mg Xr24h-tab (Metoprolol succinate) .... One tab by mouth daily 21)  Metanx 3-35-2 Mg Tabs (L-methylfolate-b6-b12) .... Take as directed by pain medicine. 22)  Fluconazole 150 Mg Tabs (Fluconazole) .[name] Kitchen.. 1 tab by  mouth once for yeast infection  Patient Instructions: 1)  I'll call to tell you if that is ringworm in your hair. 2)  I've sent a prescription for Fluconazole (1 dose) to your pharmacy.  If vaginal discharge persists for 2 days or if you develop abdominal pain, fever, or nausea, return to clinic immediately. Prescriptions: FLUCONAZOLE 150 MG TABS (FLUCONAZOLE) 1 tab by mouth once for yeast infection  #1 x 0   Entered and Authorized by:   Graciella Belton MD   Signed by:   Graciella Belton MD on 06/14/2009   Method used:   Electronically to        [facility] (retail)       [address] [place], [place]  [id number]       Ph: [phone]       Fax: [phone]   RxID:   [id number]   Appended Document: KOH = negative     Lab Visit  Laboratory Results  Date/Time Received: Jun 14, 2009 12:20 PM  Date/Time Reported: Jun 14, 2009 5:53 PM   Other Tests  Skin KOH: Negative Comments: hair sample sent for fungal culture ...............test performed by......[name] KitchenBonnie A. Martinique, MLS (ASCP)cm   Orders Today: [facility] [id number] Miscellaneous Lab [place] [phone]

## 2010-02-27 NOTE — Progress Notes (Signed)
Summary: meds   Phone Note Call from Patient Call back at Home Phone [phone]   Caller: Patient Summary of Call: GUAIFENESIN-DM 100-10 MG/5ML SYRP is not helping and would like brand name of Tussen X called in - [facility] Initial call taken by: Audie Clear,  December 15, 2009 1:57 PM  Follow-up for Phone Call        Called into pharmacy Follow-up by: Mylinda Latina MD,  December 15, 2009 2:25 PM    New/Updated Medications: Cathie Hoops ER 10-8 MG/5ML LQCR (HYDROCOD POLST-CHLORPHEN POLST) 5 ml every 12 hours as needed for cough Dispo: QS for 2 weeks Prescriptions: TUSSIONEX PENNKINETIC ER 10-8 MG/5ML LQCR (HYDROCOD POLST-CHLORPHEN POLST) 5 ml every 12 hours as needed for cough Dispo: QS for 2 weeks  #1 x 0   Entered and Authorized by:   Mylinda Latina MD   Signed by:   Mylinda Latina MD on 12/15/2009   Method used:   Telephoned to ...       [facility] (retail)       [address] [place], [place]  [id number]       Ph: [phone]       Fax: [phone]   RxID:   [id number]

## 2010-02-27 NOTE — Assessment & Plan Note (Signed)
Summary: F/U Urinary Incontinence   Vital Signs:  Patient profile:   45 year old female Height:      69 inches Weight:      337.6 pounds BMI:     50.04 Temp:     98.2 degrees F oral Pulse rate:   94 / minute BP sitting:   121 / 84  (left arm) Cuff size:   large  Vitals Entered By: Levert Feinstein LPN (July  8, 624THL [id number] AM) CC: F/U Urinary Incontinence Is Patient Diabetic? No Pain Assessment Patient in pain? no        Primary Care Provider:  Graciella Belton MD  CC:  F/U Urinary Incontinence.  History of Present Illness: 1. Urinary incontinence:  Pt is following up after being seen by Dr. Jeannine Kitten last week with urinary incontinence.  At that point it was present for 2 weeks and consisted of nocturia and occassional daytime urinary incontinence.  This issue started for  her approximately 3 years ago and improved after neck surgery and she has been on Oxybutinn which has provided good relief.  Dr. Jeannine Kitten recommended a lumbar / sacral MRI to look for disc disease, however she didn't keep that appointment because she wasn't feel well.  Today she reports the the urinary incontinence and nocturia has resolved.  She thinks that she had too much fluid on her body and since she has been able to get that fluid off with fluid pills and she doesn't have to go to the bathroom at night.  She is able to control her urine and can sense when she needs to urinate.      ROS: She denies fevers, abdominal pain, LE weakness and numbness, saddle anesthesia.    Specialist followup 1. Dr. Hardin Negus - Pain medicine on 07/22 2. Dr. [name] - Neurosurgery on 08/03 3. Dr. Risa Grill - Urology on 08/22  Habits & Providers  Alcohol-Tobacco-Diet     Tobacco Status: never  Current Medications (verified): 1)  Lotensin Hct 20-12.5 Mg Tab (Benazepril-Hctz) .... Take 1 Tablet By Mouth Once A Day 2)  Amitriptyline Hcl 75 Mg Tabs (Amitriptyline Hcl) .[name] Kitchen.. 1 Tablet By Mouth At Bedtime 3)  Aspirin 81 Mg Chew (Aspirin) .... Take  1 Tablet By Mouth Once A Day 4)  Caltrate 600+d Plus 600-400 Mg-Unit Tabs (Calcium Carbonate-Vit D-Min) .... Take 1 Tablet By Mouth Three Times A Day 5)  Neurontin 800 Mg Tabs (Gabapentin) .... Take 1 Tablet By Mouth Four Times A Day 6)  Singulair 10 Mg Tabs (Montelukast Sodium) .... Take 1 Tablet By Mouth Once A Day 7)  Trazodone Hcl 100 Mg Tabs (Trazodone Hcl) .... Take 1 Tablet By Mouth At Bedtime 8)  Prevacid 30 Mg  Cpdr (Lansoprazole) .... One Tablet Once Daily For Dyspepsia 9)  Allegra 180 Mg  Tabs (Fexofenadine Hcl) .[name] Kitchen.. 1 Tablet Daily.  Patient Has Failed Claritin and Zyrtec. 10)  Effexor Xr 75 Mg  Xr24h-Cap (Venlafaxine Hcl) .... 2 Tabs By Mouth Daily 11)  Sucralfate 1 Gm  Tabs (Sucralfate) .[name] Kitchen.. 1 Tab By Mouth Daily 12)  Oxybutynin Chloride 15 Mg  Tb24 (Oxybutynin Chloride) .[name] Kitchen.. 1 Tab By Mouth Daily 13)  Naproxen 375 Mg  Tabs (Naproxen) .[name] Kitchen.. 1 Tab By Mouth Two Times A Day 14)  One Daily   Tabs (Multiple Vitamin) .[name] Kitchen.. 1 Tab Daily By Mouth 15)  Docusate Sodium 100 Mg  Tabs (Docusate Sodium) .... 2 Tabs By Mouth Qam and 2 Tabs By Mouth Qpm 16)  Depo-Provera 150 Mg/ml  Im Susp (Medroxyprogesterone Acetate) .[name] KitchenMarland KitchenMarland Kitchen 150 Mg Im Q67months 17)  Furosemide 20 Mg  Tabs (Furosemide) .[name] Kitchen.. 1 Tab By Mouth Qam As Needed For Leg Swelling 18)  Opana Er 20 Mg Xr12h-Tab (Oxymorphone Hcl) .[name] Kitchen.. 1 Every 12 Hours 19)  Exalgo 12 Mg (Hydromorphone Extended Release) .[name] Kitchen.. 1 Tablet By Mouth Once Daily 20)  Toprol Xl 50 Mg Xr24h-Tab (Metoprolol Succinate) .... One Tab By Mouth Daily 21)  Metanx 3-35-2 Mg Tabs (L-Methylfolate-B6-B12) .... Take As Directed By Pain Medicine.  Allergies: 1)  ! Levofloxacin (Levofloxacin) 2)  Methadone Hcl (Methadone Hcl)  Past History:  Past Medical History: Reviewed history from 12/15/2007 and no changes required. h pylori gastritis, tx 6/99,  Left breast infection; 10/01 and 12/01,  small diffuse goiter - no tx per Dwyane Dee 11/04  Other Providers: Dr. [name], [facility]  Orthopedics Dr. Doren Custard, [facility], [facility] [name], LCSW, Psychotherapy, [address], [place], [place], [address], [phone] Dr. Bernestine Amass, [facility] Dr. Starling Manns, [place]  Social History: Reviewed history from 12/15/2007 and no changes required. Disabled from chronic back pain.  Lives with 2 sons.  No EtOH since 123456, no illicit drugs.  Former EtOH and substance abuse. Smoking Status:  never  Physical Exam  General:  VITALS:  Reviewed, normal GEN: Morbidly obses.  Alert & oriented, no acute distress CARDIO: Regular rate and rhythm, no murmurs/rubs/gallops, 2+ bilateral radial pulses RESP: Clear to auscultation, normal work of breathing, no retractions/accessory muscle use ABD: Obese, normoactive bowel sounds, nontender, no masses/hepatosplenomegaly Back: Multiple well-healed scars from surgery.  Decreased ROM.   Impression & Recommendations:  Problem # 1:  UNSPECIFIED URINARY INCONTINENCE (ICD-788.30) Assessment Improved  Has resolved.  May have been related to fluid retention which has also resolved.  Told her that she likely does not need the MRI anymore since she is no longer symptomatic.  Orders: [place]- Est Level  3 [id number])  Complete Medication List: 1)  Lotensin Hct 20-12.5 Mg Tab (Benazepril-hctz) .... Take 1 tablet by mouth once a day 2)  Amitriptyline Hcl 75 Mg Tabs (Amitriptyline hcl) .[name] Kitchen.. 1 tablet by mouth at bedtime 3)  Aspirin 81 Mg Chew (Aspirin) .... Take 1 tablet by mouth once a day 4)  Caltrate 600+d Plus 600-400 Mg-unit Tabs (Calcium carbonate-vit d-min) .... Take 1 tablet by mouth three times a day 5)  Neurontin 800 Mg Tabs (Gabapentin) .... Take 1 tablet by mouth four times a day 6)  Singulair 10 Mg Tabs (Montelukast sodium) .... Take 1 tablet by mouth once a day 7)  Trazodone Hcl 100 Mg Tabs (Trazodone hcl) .... Take 1 tablet by mouth at bedtime 8)  Prevacid 30 Mg Cpdr  (Lansoprazole) .... One tablet once daily for dyspepsia 9)  Allegra 180 Mg Tabs (Fexofenadine hcl) .[name] Kitchen.. 1 tablet daily.  patient has failed claritin and zyrtec. 10)  Effexor Xr 75 Mg Xr24h-cap (Venlafaxine hcl) .... 2 tabs by mouth daily 11)  Sucralfate 1 Gm Tabs (Sucralfate) .[name] Kitchen.. 1 tab by mouth daily 12)  Oxybutynin Chloride 15 Mg Tb24 (Oxybutynin chloride) .[name] Kitchen.. 1 tab by mouth daily 13)  Naproxen 375 Mg Tabs (Naproxen) .[name] Kitchen.. 1 tab by mouth two times a day 14)  One Daily Tabs (Multiple vitamin) .[name] Kitchen.. 1 tab daily by mouth 15)  Docusate Sodium 100 Mg Tabs (Docusate sodium) .... 2 tabs by mouth qam and 2 tabs by mouth qpm 16)  Depo-provera 150 Mg/ml Im Susp (Medroxyprogesterone acetate) .[name] KitchenMarland KitchenMarland Kitchen 150 mg im  q1months 17)  Furosemide 20 Mg Tabs (Furosemide) .[name] Kitchen.. 1 tab by mouth qam as needed for leg swelling 18)  Opana Er 20 Mg Xr12h-tab (Oxymorphone hcl) .[name] Kitchen.. 1 every 12 hours 19)  Exalgo 12 Mg (hydromorphone Extended Release)  .[name] Kitchen.. 1 tablet by mouth once daily 20)  Toprol Xl 50 Mg Xr24h-tab (Metoprolol succinate) .... One tab by mouth daily 21)  Metanx 3-35-2 Mg Tabs (L-methylfolate-b6-b12) .... Take as directed by pain medicine.

## 2010-02-28 ENCOUNTER — Ambulatory Visit: Admit: 2010-02-28 | Payer: Self-pay

## 2010-02-28 ENCOUNTER — Ambulatory Visit: Payer: Self-pay

## 2010-03-01 NOTE — Progress Notes (Signed)
Summary: Schedule [facility]  Phone Note Call from Patient   Caller: Patient Call For: Zella Ball, Psy.D. Summary of Call: Amillianna called to schedule an appt.  Reviewed note and determined it was for [facility].  Scheduled for 02/28/10 at 10:00.  She said that she is no longer seeing her therapist.  She had surgery and could not attend.  She is not getting any psychological services.  Will explore further during [facility]. Initial call taken by: Zella Ball PsyD,  February 08, 2010 9:56 AM

## 2010-03-01 NOTE — Assessment & Plan Note (Signed)
Summary: fell & hurt back,df   Vital Signs:  Patient profile:   45 year old female Weight:      340 pounds Temp:     98.8 degrees F oral Pulse rate:   120 / minute Pulse rhythm:   regular BP sitting:   123 / 74  (left arm) Cuff size:   large  Vitals Entered By: Audelia Hives CMA (January 16, 2010 11:42 AM)  Primary Care Naythan Douthit:  Graciella Belton MD   History of Present Illness: 45 yo female with multiple back surgeries and multilevel lumbar fusion, recent fall on Sat (3d ago).  Was trying to sit on a stool, stool collapsed and hit her mid lumbar spine.  Immediate pain, no swelling, no loss of function.  No pops/snaps heard.  Since injury has been using usual pain meds morphine oral as rxed by her pain MD.  No other meds taken.  No NEW radicular symptoms,  no cauda equina type symptoms, no saddle anesthesia, no problems with bowel/bladder function.   Current Medications (verified): 1)  Lotensin Hct 20-12.5 Mg Tab (Benazepril-Hctz) .... Take 1 Tablet By Mouth Once A Day 2)  Amitriptyline Hcl 75 Mg Tabs (Amitriptyline Hcl) .[name] Kitchen.. 1 Tablet By Mouth At Bedtime 3)  Aspirin 81 Mg Chew (Aspirin) .... Take 1 Tablet By Mouth Once A Day 4)  Caltrate 600+d Plus 600-400 Mg-Unit Tabs (Calcium Carbonate-Vit D-Min) .... Take 1 Tablet By Mouth Three Times A Day 5)  Neurontin 800 Mg Tabs (Gabapentin) .... Take 1 Tablet By Mouth Four Times A Day 6)  Singulair 10 Mg Tabs (Montelukast Sodium) .... Take 1 Tablet By Mouth Once A Day 7)  Trazodone Hcl 100 Mg Tabs (Trazodone Hcl) .... Take 1 Tablet By Mouth At Bedtime 8)  Prevacid 30 Mg  Cpdr (Lansoprazole) .... One Tablet Once Daily For Dyspepsia 9)  Effexor Xr 75 Mg  Xr24h-Cap (Venlafaxine Hcl) .... 2 Tabs By Mouth Daily 10)  Sucralfate 1 Gm  Tabs (Sucralfate) .[name] Kitchen.. 1 Tab By Mouth Daily 11)  Oxybutynin Chloride 15 Mg  Tb24 (Oxybutynin Chloride) .[name] Kitchen.. 1 Tab By Mouth Daily 12)  Furosemide 20 Mg  Tabs (Furosemide) .[name] Kitchen.. 1 Tab By Mouth Qam As Needed For Leg  Swelling 13)  Toprol Xl 50 Mg Xr24h-Tab (Metoprolol Succinate) .... One Tab By Mouth Daily 14)  Cetirizine Hcl 10 Mg Tabs (Cetirizine Hcl) .[name] Kitchen.. 1 Tab By Mouth Daily 15)  Promethazine Hcl 25 Mg Tabs (Promethazine Hcl) .[name] Kitchen.. 1 Tab By Mouth Every 6 Hours As Needed For Nausea 16)  Provera 10 Mg Tabs (Medroxyprogesterone Acetate) .[name] Kitchen.. 1 Tab Daily For The Next 5 Days 17)  Diflucan 150 Mg Tabs (Fluconazole) .[name] Kitchen.. 1 Tab By Mouth For Prevention of Yeast Infection 18)  Tussionex Pennkinetic Er 10-8 Mg/35ml Lqcr (Hydrocod Polst-Chlorphen Polst) .... 5 Ml Every 12 Hours As Needed For Cough Dispo: Qs For 2 Weeks 19)  Doxycycline Hyclate 100 Mg Caps (Doxycycline Hyclate) .[name] Kitchen.. 1 Tab By Mouth Twice A Day For 10 Days  Allergies (verified): 1)  ! Levofloxacin (Levofloxacin) 2)  Methadone Hcl (Methadone Hcl)  Past History:  Past Surgical History: Last updated: 12/15/2007 breast cyst removal - 01/28/1998, Breast cyst removal - 12/28/2000, breast cyst removal - 06/28/1997,  cervical conization - 11/29/1991, endometrial Biopsy-normal - 06/04/2004,  lumbar fusion L3-4 and L4-5 - 03/29/1998,  lumbar laminectomy 8/95 and 10/95 -,  US - Abdominal 1.7 cm fibroid, bilateral simple ovarian cysts, slight cul-de-sac free fluid - 06/06/2000,  Review of Systems  See HPI  Physical Exam  General:  Well-developed,well-nourished,in no acute distress; alert,appropriate and cooperative throughout examination Lungs:  Normal respiratory effort, chest expands symmetrically. Lungs are clear to auscultation, no crackles or wheezes. Heart:  Normal rate and regular rhythm. S1 and S2 normal without gallop, murmur, click, rub or other extra sounds. Msk:  Back normal to inspection, well healed scar from previous laminectomy seen.  Alignment normal.  No palpable stepoffs however immense amounts adipose tissue make it difficult to palpate spinous processes.  ROM painless to flexion, extension, rotation, and side bending.   Trendelenburg sign  negative.  LE strength 5/5 to hip flexion/extension/abduction/adduction. Knee flexion/extension Ankle eversion/inversion/dorsi/plantarflexion. Sensation grossly intact in LE. DTRs 2+ on achilles, 1+ on patellar, Babinski reflex downward. Ambulatory with non-antalgic gait.   Impression & Recommendations:  Problem # 1:  BACK PAIN, ACUTE (ICD-724.5) Assessment New S/p fall. No signs cord compression/cauda equina. toradol 60mg  IM given in office. XR LS complete and AP pelvis. Cont current pain meds. RTC as needed.  Her updated medication list for this problem includes:    Aspirin 81 Mg Chew (Aspirin) .[name] Kitchen... Take 1 tablet by mouth once a day  Orders: [facility]- Est  Level 4 [id number]) Ketorolac-Toradol 15mg  [id number]) Diagnostic X-Ray/Fluoroscopy (Diagnostic X-Ray/Flu)  Complete Medication List: 1)  Lotensin Hct 20-12.5 Mg Tab (Benazepril-hctz) .... Take 1 tablet by mouth once a day 2)  Amitriptyline Hcl 75 Mg Tabs (Amitriptyline hcl) .[name] Kitchen.. 1 tablet by mouth at bedtime 3)  Aspirin 81 Mg Chew (Aspirin) .... Take 1 tablet by mouth once a day 4)  Caltrate 600+d Plus 600-400 Mg-unit Tabs (Calcium carbonate-vit d-min) .... Take 1 tablet by mouth three times a day 5)  Neurontin 800 Mg Tabs (Gabapentin) .... Take 1 tablet by mouth four times a day 6)  Singulair 10 Mg Tabs (Montelukast sodium) .... Take 1 tablet by mouth once a day 7)  Trazodone Hcl 100 Mg Tabs (Trazodone hcl) .... Take 1 tablet by mouth at bedtime 8)  Prevacid 30 Mg Cpdr (Lansoprazole) .... One tablet once daily for dyspepsia 9)  Effexor Xr 75 Mg Xr24h-cap (Venlafaxine hcl) .... 2 tabs by mouth daily 10)  Sucralfate 1 Gm Tabs (Sucralfate) .[name] Kitchen.. 1 tab by mouth daily 11)  Oxybutynin Chloride 15 Mg Tb24 (Oxybutynin chloride) .[name] Kitchen.. 1 tab by mouth daily 12)  Furosemide 20 Mg Tabs (Furosemide) .[name] Kitchen.. 1 tab by mouth qam as needed for leg swelling 13)  Toprol Xl 50 Mg Xr24h-tab (Metoprolol succinate) .... One tab by mouth daily 14)  Cetirizine  Hcl 10 Mg Tabs (Cetirizine hcl) .[name] Kitchen.. 1 tab by mouth daily 15)  Promethazine Hcl 25 Mg Tabs (Promethazine hcl) .[name] Kitchen.. 1 tab by mouth every 6 hours as needed for nausea 16)  Provera 10 Mg Tabs (Medroxyprogesterone acetate) .[name] Kitchen.. 1 tab daily for the next 5 days 17)  Diflucan 150 Mg Tabs (Fluconazole) .[name] Kitchen.. 1 tab by mouth for prevention of yeast infection 18)  Tussionex Pennkinetic Er 10-8 Mg/42ml Lqcr (Hydrocod polst-chlorphen polst) .... 5 ml every 12 hours as needed for cough dispo: qs for 2 weeks 19)  Doxycycline Hyclate 100 Mg Caps (Doxycycline hyclate) .[name] Kitchen.. 1 tab by mouth twice a day for 10 days  Other Orders: Influenza Vaccine MCR [id number])   Medication Administration  Injection # 1:    Medication: Ketorolac-Toradol 15mg     Diagnosis: BACK PAIN, ACUTE (ICD-724.5)    Route: IM    Site: LUOQ gluteus    Exp Date: 06/29/2011    Lot #: [id number]  Mfr: [email or web address]    Patient tolerated injection without complications    Given by: Audelia Hives CMA (January 16, 2010 12:32 PM)  Orders Added: 1)  Influenza Vaccine MCR [00025] 2)  [facility]- Est  Level 4 [99214] 3)  Ketorolac-Toradol 15mg  [J1885] 4)  Diagnostic X-Ray/Fluoroscopy [Diagnostic X-Ray/Flu]   Immunizations Administered:  Influenza Vaccine # 1:    Vaccine Type: Fluvax MCR    Site: left deltoid    Mfr: [email or web address]    Dose: 0.5 ml    Route: IM    Given by: Audelia Hives CMA    Exp. Date: 07/28/2010    Lot #: [id number]    VIS given: 08/22/09 version given January 16, 2010.  Flu Vaccine Consent Questions:    Do you have a history of severe allergic reactions to this vaccine? no    Any prior history of allergic reactions to egg and/or gelatin? no    Do you have a sensitivity to the preservative Thimersol? no    Do you have a past history of Guillan-Barre Syndrome? no    Do you currently have an acute febrile illness? no    Have you ever had a severe reaction to latex? no    Vaccine information given and explained to patient?  yes    Are you currently pregnant? no   Immunizations Administered:  Influenza Vaccine # 1:    Vaccine Type: Fluvax MCR    Site: left deltoid    Mfr: GlaxoSmithKline    Dose: 0.5 ml    Route: IM    Given by: Audelia Hives CMA    Exp. Date: 07/28/2010    Lot #: [id number]    VIS given: 08/22/09 version given January 16, 2010.

## 2010-03-01 NOTE — Progress Notes (Signed)
  Phone Note Call from Patient   Caller: Patient Call For: [phone] Summary of Call: Medicare part D will no longer cover Prevacid.  So she will need a different rx that's stronger.  Need rx asap and prescribed for two times a day.   Initial call taken by: Eusebio Friendly,  January 15, 2010 11:10 AM    New/Updated Medications: OMEPRAZOLE 20 MG CPDR (OMEPRAZOLE) 1 tab by mouth twice a day for acid reflux Prescriptions: OMEPRAZOLE 20 MG CPDR (OMEPRAZOLE) 1 tab by mouth twice a day for acid reflux  #60 x 3   Entered and Authorized by:   Mylinda Latina MD   Signed by:   Mylinda Latina MD on 01/25/2010   Method used:   Electronically to        [facility] (retail)       [place], [place]  [id number]       Ph: [phone]       Fax: [phone]   RxID:   [id number]

## 2010-03-01 NOTE — Assessment & Plan Note (Signed)
Summary: f/u cold symptoms/bmc   Vital Signs:  Patient profile:   45 year old female Height:      69 inches Weight:      348.5 pounds BMI:     51.65 Temp:     98.0 degrees F oral Pulse rate:   109 / minute BP sitting:   151 / 93  (left arm) Cuff size:   large  Vitals Entered By: Enid Skeens, CMA (January 26, 2010 10:55 AM) CC: multiple issues, cold sxs Is Patient Diabetic? No   Primary Care Provider:  Graciella Belton MD  CC:  multiple issues and cold sxs.  History of Present Illness: 1. Cold:  Pt has had viral URI symptoms with cough, congestion, runny nose for the past 4-6 weeks.  Overall she is doing much better.  However, she is still having thick congestion.  She took a full course of antibiotics.  She is still taking MucinexDM.  Would like something to help with her thick congestion  ROS: denies fevers, chills, night sweats, weight loss  2. Back pain:  Pt fell from a bar stool about 1 week ago.  Was seen in clinic and sent for x-rays which were negative for fracture.  Overall her pain is better but she is still having some significant lower back pain.  She is already on narcotics that she gets from the pain clinic.  ROS: Denies pain shooting down her legs, denies saddle anesthesia  3. Depression:  She thinks that the Effexor is not helping as much as it used to.  She has more depression, crying spells, decreased energy, decreased desire to do things she used to do.  She has been on multiple different anti-depressants in the past.  Meds: She is also on Trazadone and Amitryptaline for sleep  ROS: denies SI  4. GERD:  She would like to switch to Nexium because that is going to ge covered by Medicaid.  She is having acid reflux and the bad taste in her mouth.  She thinks that she needs something for twice a day.   Habits & Providers  Alcohol-Tobacco-Diet     Tobacco Status: current  Current Medications (verified): 1)  Lotensin Hct 20-12.5 Mg Tab (Benazepril-Hctz) ....  Take 1 Tablet By Mouth Once A Day 2)  Amitriptyline Hcl 75 Mg Tabs (Amitriptyline Hcl) .[name] Kitchen.. 1 Tablet By Mouth At Bedtime 3)  Aspirin 81 Mg Chew (Aspirin) .... Take 1 Tablet By Mouth Once A Day 4)  Caltrate 600+d Plus 600-400 Mg-Unit Tabs (Calcium Carbonate-Vit D-Min) .... Take 1 Tablet By Mouth Three Times A Day 5)  Neurontin 800 Mg Tabs (Gabapentin) .... Take 1 Tablet By Mouth Four Times A Day 6)  Singulair 10 Mg Tabs (Montelukast Sodium) .... Take 1 Tablet By Mouth Once A Day 7)  Trazodone Hcl 100 Mg Tabs (Trazodone Hcl) .... Take 1 Tablet By Mouth At Bedtime 8)  Nexium 20 Mg Cpdr (Esomeprazole Magnesium) .[name] Kitchen.. 1 Tab By Mouth Twice A Day For Acid Reflux 9)  Effexor Xr 75 Mg  Xr24h-Cap (Venlafaxine Hcl) .... 2 Tabs By Mouth Daily 10)  Sucralfate 1 Gm  Tabs (Sucralfate) .[name] Kitchen.. 1 Tab By Mouth Daily 11)  Oxybutynin Chloride 15 Mg  Tb24 (Oxybutynin Chloride) .[name] Kitchen.. 1 Tab By Mouth Daily 12)  Furosemide 20 Mg  Tabs (Furosemide) .[name] Kitchen.. 1 Tab By Mouth Qam As Needed For Leg Swelling 13)  Toprol Xl 50 Mg Xr24h-Tab (Metoprolol Succinate) .... One Tab By Mouth Daily 14)  Cetirizine Hcl 10 Mg  Tabs (Cetirizine Hcl) .[name] Kitchen.. 1 Tab By Mouth Daily 15)  Promethazine Hcl 25 Mg Tabs (Promethazine Hcl) .[name] Kitchen.. 1 Tab By Mouth Every 6 Hours As Needed For Nausea 16)  Provera 10 Mg Tabs (Medroxyprogesterone Acetate) .[name] Kitchen.. 1 Tab Daily For The Next 5 Days 17)  Cymbalta 20 Mg Cpep (Duloxetine Hcl) .[name] Kitchen.. 1 Tab By Mouth Daily For Depression  Allergies: 1)  ! Levofloxacin (Levofloxacin) 2)  Methadone Hcl (Methadone Hcl)  Past History:  Past Medical History: Reviewed history from 12/15/2007 and no changes required. h pylori gastritis, tx 6/99,  Left breast infection; 10/01 and 12/01,  small diffuse goiter - no tx per Dwyane Dee 11/04  Other Providers: Dr. [name], [facility] Dr. Doren Custard, [facility] [name], LCSW, Psychotherapy, [address], [place], [place], [address], [place] Dr. Bernestine Amass, [facility] Dr. Starling Manns, [place]  Social History: Reviewed history from 12/13/2009 and no changes required. Disabled from chronic back pain.  Lives with 2 sons.  No EtOH since 123456, no illicit drugs.  Former EtOH and substance abuse.   Smokes 1 ppd.  Physical Exam  General:  vitals reviewed.  hypertensive.  Obese.  No acute distress. Nose:  no nasal discharge Mouth:  OP pink and moist Neck:  no masses.   Lungs:  Normal respiratory effort, chest expands symmetrically. Lungs are clear to auscultation, no crackles or wheezes. Heart:  Normal rate and regular rhythm. S1 and S2 normal without gallop, murmur, click, rub or other extra sounds. Msk:  Back normal to inspection, well healed scar from previous laminectomy seen.  Alignment normal.  No palpable stepoffs however immense amounts adipose tissue make it difficult to palpate spinous processes.  ROM painless to flexion, extension, rotation, and side bending.   Trendelenburg sign negative.  LE strength 5/5 to hip flexion/extension/abduction/adduction. Knee flexion/extension Ankle eversion/inversion/dorsi/plantarflexion. Sensation grossly intact in LE. DTRs 2+ on achilles, 1+ on patellar, Babinski reflex downward. Ambulatory with non-antalgic gait. Psych:  normally interactive, good eye contact, and flat affect.     Impression & Recommendations:  Problem # 1:  ACUTE BRONCHITIS (ICD-466.0) Assessment Improved  Advised her to stop taking the MucinexDM and switch to regular Mucinex to help break up the mucous. The following medications were removed from the medication list:    Tussionex Pennkinetic Er 10-8 Mg/7ml Lqcr (Hydrocod polst-chlorphen polst) .[name] KitchenMarland KitchenMarland KitchenMarland Kitchen 5 ml every 12 hours as needed for cough dispo: qs for 2 weeks    Doxycycline Hyclate 100 Mg Caps (Doxycycline hyclate) .[name] Kitchen... 1 tab by mouth twice a day for 10 days Her updated medication list for this problem includes:     Singulair 10 Mg Tabs (Montelukast sodium) .[name] Kitchen... Take 1 tablet by mouth once a day  Orders: [facility]- Est  Level 4 [id number])  Problem # 2:  BACK PAIN, ACUTE (ICD-724.5) Assessment: Improved  Advised for her to continue with the pain meds per pain clinic. Her updated medication list for this problem includes:    Aspirin 81 Mg Chew (Aspirin) .[name] Kitchen... Take 1 tablet by mouth once a day  Orders: [facility]- Est  Level 4 [id number])  Problem # 3:  GERD (ICD-530.81) Assessment: Deteriorated  Will switch to Nexium bid Her updated medication list for this problem includes:    Nexium 20 Mg Cpdr (Esomeprazole magnesium) .[name] Kitchen... 1 tab by mouth twice a day for acid reflux    Sucralfate 1 Gm Tabs (Sucralfate) .[name] Kitchen... 1 tab by mouth daily  Orders: [facility]- Est  Level 4 (99214)  Problem # 4:  DEPRESSIVE DISORDER, NOS (ICD-311) Assessment: Deteriorated  She has tried multiple SSRI.  She states that she has tried Effexor, Zoloft, Wellbutrin, Paxil, Prozac.  I do not think that she has tried Cymbalta.  Will try that.  I am a little concerned with the combination of medications so will have her schedule an appointment with Dr. Gwenlyn Saran and Dr. Tammi Klippel for follow up. Her updated medication list for this problem includes:    Amitriptyline Hcl 75 Mg Tabs (Amitriptyline hcl) .[name] Kitchen... 1 tablet by mouth at bedtime    Trazodone Hcl 100 Mg Tabs (Trazodone hcl) .[name] Kitchen... Take 1 tablet by mouth at bedtime    Effexor Xr 75 Mg Xr24h-cap (Venlafaxine hcl) .[name] Kitchen... 2 tabs by mouth daily    Cymbalta 20 Mg Cpep (Duloxetine hcl) .[name] Kitchen... 1 tab by mouth daily for depression  Orders: [facility]- Est  Level 4 [id number])  Complete Medication List: 1)  Lotensin Hct 20-12.5 Mg Tab (Benazepril-hctz) .... Take 1 tablet by mouth once a day 2)  Amitriptyline Hcl 75 Mg Tabs (Amitriptyline hcl) .[name] Kitchen.. 1 tablet by mouth at bedtime 3)  Aspirin 81 Mg Chew (Aspirin) .... Take 1 tablet by mouth once a day 4)  Caltrate 600+d Plus 600-400 Mg-unit Tabs (Calcium carbonate-vit d-min) ....  Take 1 tablet by mouth three times a day 5)  Neurontin 800 Mg Tabs (Gabapentin) .... Take 1 tablet by mouth four times a day 6)  Singulair 10 Mg Tabs (Montelukast sodium) .... Take 1 tablet by mouth once a day 7)  Trazodone Hcl 100 Mg Tabs (Trazodone hcl) .... Take 1 tablet by mouth at bedtime 8)  Nexium 20 Mg Cpdr (Esomeprazole magnesium) .[name] Kitchen.. 1 tab by mouth twice a day for acid reflux 9)  Effexor Xr 75 Mg Xr24h-cap (Venlafaxine hcl) .... 2 tabs by mouth daily 10)  Sucralfate 1 Gm Tabs (Sucralfate) .[name] Kitchen.. 1 tab by mouth daily 11)  Oxybutynin Chloride 15 Mg Tb24 (Oxybutynin chloride) .[name] Kitchen.. 1 tab by mouth daily 12)  Furosemide 20 Mg Tabs (Furosemide) .[name] Kitchen.. 1 tab by mouth qam as needed for leg swelling 13)  Toprol Xl 50 Mg Xr24h-tab (Metoprolol succinate) .... One tab by mouth daily 14)  Cetirizine Hcl 10 Mg Tabs (Cetirizine hcl) .[name] Kitchen.. 1 tab by mouth daily 15)  Promethazine Hcl 25 Mg Tabs (Promethazine hcl) .[name] Kitchen.. 1 tab by mouth every 6 hours as needed for nausea 16)  Provera 10 Mg Tabs (Medroxyprogesterone acetate) .[name] Kitchen.. 1 tab daily for the next 5 days 17)  Cymbalta 20 Mg Cpep (Duloxetine hcl) .[name] Kitchen.. 1 tab by mouth daily for depression  Patient Instructions: 1)  I hope you have a Happy New Years 2)  I am going to switch your acid reflux to Nexium twice a day 3)  I am going to add Cymbalta to your anti-depressant regimen 4)  I would like for you to call and schedule an appointment with our Mental Health specialist - Dr. Gwenlyn Saran 5)  Stop taking the [place] and start taking just the Mucinex for the mucous 6)  Please schedule a follow up appointment in 4 weeks to check on your depression Prescriptions: CYMBALTA 20 MG CPEP (DULOXETINE HCL) 1 tab by mouth daily for depression  #30 x 3   Entered and Authorized by:   Mylinda Latina MD   Signed by:   Mylinda Latina MD on 01/26/2010   Method used:   Electronically to        [facility]* (retail)  [place], [place]   [id number]       Ph: [phone]       Fax: [phone]   RxID:   [id number] NEXIUM 20 MG CPDR (ESOMEPRAZOLE MAGNESIUM) 1 tab by mouth twice a day for acid reflux  #60 x 3   Entered and Authorized by:   Mylinda Latina MD   Signed by:   Mylinda Latina MD on 01/26/2010   Method used:   Electronically to        [facility] (retail)       [address] [place], [place]  [id number]       Ph: [phone]       Fax: [phone]   RxID:   [id number]    Orders Added: 1)  [facility]- Est  Level 4 [id number]

## 2010-03-01 NOTE — Progress Notes (Signed)
Summary: Req change meds  Phone Note Call from Patient Call back at Home Phone [phone]   Reason for Call: Talk to Doctor Summary of Call: wants to speak with MD about her medication, wants to switch from nexium to prevacid, ins is changing their coverage on this medication, pt would like 40 mg twice daily. send to [facility] home delivery.  Initial call taken by: Samara Snide,  February 19, 2010 12:10 PM  Follow-up for Phone Call        She called a month ago and wanted to switch from Prevacid.  Are you sure that is what she requested.  If so please get the information for the pharmacy to send the Rx to. Follow-up by: Mylinda Latina MD,  February 19, 2010 1:53 PM  Additional Follow-up for Phone Call Additional follow up Details #1::        Left message vm. Additional Follow-up by: Levert Feinstein LPN,  January 24, [id number] 8:49 AM    Additional Follow-up for Phone Call Additional follow up Details #2::    Patient states that ins will now cover prevacid and wants to switch back to that bc the nexium doesnt work. She wants this sent to [facility] order. She states she ill bring in the letter with her tomm at her sons appt that shows exactly where to send med. Follow-up by: Levert Feinstein LPN,  January 24, [id number] 3:39 PM   Appended Document: Req change meds    Clinical Lists Changes  Medications: Changed medication from [place] 20 MG CPDR (ESOMEPRAZOLE MAGNESIUM) 1 tab by mouth twice a day for acid reflux to LANSOPRAZOLE 30 MG CPDR (LANSOPRAZOLE) 1 tab by mouth twice a day - Signed Rx of LANSOPRAZOLE 30 MG CPDR (LANSOPRAZOLE) 1 tab by mouth twice a day;  #60 x 3;  Signed;  Entered by: Mylinda Latina MD;  Authorized by: Mylinda Latina MD;  Method used: Electronically to [facility]*, [address], [place], [place]  [id number], Ph: [phone], Fax: [phone] Rx of OXYBUTYNIN CHLORIDE 15 MG  TB24 (OXYBUTYNIN CHLORIDE) 1 tab by mouth daily;  #30 x 6;  Signed;  Entered by: Mylinda Latina MD;  Authorized by: Mylinda Latina MD;  Method used: Electronically to [facility]*, [address] [place], [place]  [id number], Ph: [phone], Fax: [phone]    Prescriptions: OXYBUTYNIN CHLORIDE 15 MG  TB24 (OXYBUTYNIN CHLORIDE) 1 tab by mouth daily  #30 x 6   Entered and Authorized by:   Mylinda Latina MD   Signed by:   Mylinda Latina MD on 02/21/2010   Method used:   Electronically to        [facility] (retail)       [address] [place], [place]  [id number]       Ph: [phone]       Fax: [phone]   RxID:   [id number] LANSOPRAZOLE 30 MG CPDR (LANSOPRAZOLE) 1 tab by mouth twice a day  #60 x 3   Entered and Authorized by:   Mylinda Latina MD   Signed by:   Mylinda Latina MD on 02/21/2010   Method used:   Electronically to        [facility] (retail)       [place], [place]  [id number]       Ph: [phone]       Fax: [phone]   RxID:  [id number]   

## 2010-03-01 NOTE — Progress Notes (Signed)
  Phone Note Call from Patient   Caller: Patient Call For: [phone] Summary of Call: Please call patient regarding CT.   Initial call taken by: Eusebio Friendly,  January 17, 2010 2:47 PM  Follow-up for Phone Call        Xrays were completely negative. Follow-up by: Aundria Mems MD,  January 17, 2010 5:25 PM  Additional Follow-up for Phone Call Additional follow up Details #1::        Called patient with results of xrays. Additional Follow-up by: Eusebio Friendly,  January 24, 2010 8:59 AM

## 2010-03-13 ENCOUNTER — Ambulatory Visit: Payer: Self-pay | Admitting: Family Medicine

## 2010-03-14 ENCOUNTER — Ambulatory Visit (INDEPENDENT_AMBULATORY_CARE_PROVIDER_SITE_OTHER): Payer: Medicare Other | Admitting: Psychology

## 2010-03-14 DIAGNOSIS — F329 Major depressive disorder, single episode, unspecified: Secondary | ICD-10-CM

## 2010-03-14 DIAGNOSIS — F3289 Other specified depressive episodes: Secondary | ICD-10-CM

## 2010-03-14 MED ORDER — ARIPIPRAZOLE 5 MG PO TABS: 5.0000 mg | ORAL_TABLET | Freq: Every day | ORAL | Status: DC

## 2010-03-14 NOTE — Assessment & Plan Note (Signed)
[name] is groomed and appropriately dressed.  She maintains good eye contact and is cooperative and attentive.  Speech is normal in tone, rate and rhythm.  Mood is reported as depressed with an inconsistent affect.  She smiled and laughed once or twice.  She is energetic - not flat.  Thought process is tangential.  Denied suicidal and homicidal ideation - last time was reportedly 7 years ago.  Does not appear to be responding to any internal stimuli.  Insight is below average.  Judgment appears intact.   Complicated history with multiple factors likely influencing current emotional and cognitive functioning including a robust drug and alcohol history, early and reported childhood trauma and multiple chronic stressors including pain conditions.  Diagnostic possibilities include PTSD (which is likely present and primary), Mood Disorder (the type of which would be difficult to determine), chronic pain contributing to mood issues, Axis II (Cluster B).  Treatment team wanted to proceed cautiously given long-standing history and presentation.  Patient did voice an interest in making a change in medication today.  Discussed possibilities.  See patient instructions for further plan.

## 2010-03-14 NOTE — Patient Instructions (Addendum)
Please consider calling [name], LCSW (phone number: [phone])  to reconnect for therapy.  He is a therapist - not a psychiatrist.  That is likely the most important thing you can do to help your mental health. Dr. Tammi Klippel talked about changing some of your medicines so you don't have to take so many.  He described a medicine that would replace the medicines you use to sleep that might also help you manage your anger and how quickly your mind moves.  You said you were ready to make a change. Please stop the Amytriptyline and DO NOT fill the Cymbalta.  You can continue to take your Effexor and Trazodone as it is currently prescribed. Dr. Tammi Klippel prescribed you a medicine called Abilify.  We discussed possible side effects.  If you notice a restlessness or need to move or blurry vision, please let us know at [phone].
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
## 2010-03-19 ENCOUNTER — Ambulatory Visit: Payer: Medicare Other | Admitting: Family Medicine

## 2010-04-04 ENCOUNTER — Ambulatory Visit: Payer: Medicare Other | Admitting: Psychology

## 2010-04-05 ENCOUNTER — Other Ambulatory Visit: Payer: Self-pay | Admitting: Family Medicine

## 2010-04-05 ENCOUNTER — Ambulatory Visit (INDEPENDENT_AMBULATORY_CARE_PROVIDER_SITE_OTHER): Payer: Medicare Other | Admitting: Family Medicine

## 2010-04-05 ENCOUNTER — Other Ambulatory Visit (HOSPITAL_COMMUNITY)
Admission: RE | Admit: 2010-04-05 | Discharge: 2010-04-05 | Disposition: A | Discharge status: Home / Self Care | Payer: Medicare Other | Source: Ambulatory Visit | Attending: Family Medicine | Admitting: Family Medicine

## 2010-04-05 ENCOUNTER — Encounter: Payer: Self-pay | Admitting: Family Medicine

## 2010-04-05 VITALS — BP 140/82 | HR 112 | Temp 98.0°F | Wt 337.0 lb

## 2010-04-05 DIAGNOSIS — Z124 Encounter for screening for malignant neoplasm of cervix: Secondary | ICD-10-CM | POA: Insufficient documentation

## 2010-04-05 DIAGNOSIS — E669 Obesity, unspecified: Secondary | ICD-10-CM

## 2010-04-05 DIAGNOSIS — N760 Acute vaginitis: Secondary | ICD-10-CM | POA: Insufficient documentation

## 2010-04-05 DIAGNOSIS — Z202 Contact with and (suspected) exposure to infections with a predominantly sexual mode of transmission: Secondary | ICD-10-CM

## 2010-04-05 DIAGNOSIS — Z01419 Encounter for gynecological examination (general) (routine) without abnormal findings: Secondary | ICD-10-CM

## 2010-04-05 DIAGNOSIS — Z20828 Contact with and (suspected) exposure to other viral communicable diseases: Secondary | ICD-10-CM

## 2010-04-05 DIAGNOSIS — M545 Low back pain, unspecified: Secondary | ICD-10-CM

## 2010-04-05 LAB — CONVERTED CEMR LAB
Albumin: 4.3 g/dL (ref 3.5–5.2)
Alkaline Phosphatase: 75 units/L (ref 39–117)
CO2: 26 meq/L (ref 19–32)
Calcium: 9.6 mg/dL (ref 8.4–10.5)
Chlamydia, DNA Probe: NEGATIVE
Chloride: 99 meq/L (ref 96–112)
GC Probe Amp, Genital: NEGATIVE
Glucose, Bld: 90 mg/dL (ref 70–99)
Hemoglobin: 17 g/dL — ABNORMAL HIGH (ref 12.0–15.0)
LDL Cholesterol: 101 mg/dL — ABNORMAL HIGH (ref 0–99)
MCHC: 35.9 g/dL (ref 30.0–36.0)
Potassium: 4.2 meq/L (ref 3.5–5.3)
RBC: 5.79 M/uL — ABNORMAL HIGH (ref 3.87–5.11)
Sodium: 141 meq/L (ref 135–145)
Total Protein: 6.7 g/dL (ref 6.0–8.3)
Triglycerides: 104 mg/dL (ref <150)
WBC: 11.4 10*3/uL — ABNORMAL HIGH (ref 4.0–10.5)

## 2010-04-05 LAB — POCT UA - MICROSCOPIC ONLY

## 2010-04-05 LAB — COMPREHENSIVE METABOLIC PANEL
ALT: 19 U/L (ref 0–35)
Albumin: 4.3 g/dL (ref 3.5–5.2)
CO2: 26 mEq/L (ref 19–32)
Calcium: 9.6 mg/dL (ref 8.4–10.5)
Chloride: 99 mEq/L (ref 96–112)
Glucose, Bld: 90 mg/dL (ref 70–99)
Sodium: 141 mEq/L (ref 135–145)
Total Protein: 6.7 g/dL (ref 6.0–8.3)

## 2010-04-05 LAB — POCT URINALYSIS DIPSTICK
Bilirubin, UA: NEGATIVE
Blood, UA: NEGATIVE
Glucose, UA: NEGATIVE
Ketones, UA: NEGATIVE
Spec Grav, UA: <=1.005

## 2010-04-05 LAB — CBC
HCT: 47.3 % — ABNORMAL HIGH (ref 36.0–46.0)
Hemoglobin: 17 g/dL — ABNORMAL HIGH (ref 12.0–15.0)
MCHC: 35.9 g/dL (ref 30.0–36.0)

## 2010-04-05 LAB — LIPID PANEL
Cholesterol: 169 mg/dL (ref 0–200)
Triglycerides: 104 mg/dL (ref <150)

## 2010-04-05 LAB — RPR

## 2010-04-05 LAB — POCT WET PREP (WET MOUNT): Clue Cells Wet Prep HPF POC: NEGATIVE

## 2010-04-05 NOTE — Progress Notes (Signed)
  Subjective:     [name] is a 45 y.o. female and is here for a comprehensive physical exam. The patient reports problems - vaginal odor, persistent phlegm.  She has recently become sexually active again and would like to get checked for everything.  She is now with the "love of her life", and the man who she was in love with for 20 years they are finally together.  She doesn't report any other problems.  History   Social History  . Marital Status: Divorced    Spouse Name: N/A    Number of Children: N/A  . Years of Education: N/A   Occupational History  . Not on file.   Social History Main Topics  . Smoking status: Current Everyday Smoker    Types: Cigarettes  . Smokeless tobacco: Not on file  . Alcohol Use: Not on file  . Drug Use: Not on file  . Sexually Active: Not on file   Other Topics Concern  . Not on file   Social History Narrative  . No narrative on file   Health Maintenance  Topic Date Due  . Pap Smear  07/25/1981  . Tetanus/tdap  05/29/2014    The following portions of the patient's history were reviewed and updated as appropriate: allergies, current medications, past family history, past medical history, past social history, past surgical history and problem list.  Review of Systems A comprehensive review of systems was negative.   Objective:    BP 140/82  Pulse 112  Temp(Src) 98 F (36.7 C) (Oral)  Wt 337 lb (152.862 kg) General appearance: alert and morbidly obese Head: Normocephalic, without obvious abnormality, atraumatic Eyes: conjunctivae/corneas clear. PERRL, EOM's intact. Fundi benign. Ears: normal TM's and external ear canals both ears Nose: Nares normal. Septum midline. Mucosa normal. No drainage or sinus tenderness. Throat: lips, mucosa, and tongue normal; teeth and gums normal and no exudate or postnasal drip Neck: no adenopathy, no carotid bruit, no JVD, supple, symmetrical, trachea midline and thyroid not enlarged, symmetric, no  tenderness/mass/nodules Lungs: clear to auscultation bilaterally Heart: regular rate and rhythm, S1, S2 normal, no murmur, click, rub or gallop Abdomen: normal findings: bowel sounds normal and abnormal findings:  slightly TTP in LLQ Pelvic: cervix normal in appearance, external genitalia normal, no adnexal masses or tenderness, no cervical motion tenderness, rectovaginal septum normal, uterus normal size, shape, and consistency and vagina normal without discharge Pulses: 2+ and symmetric Skin: Skin color, texture, turgor normal. No rashes or lesions Neurologic: Grossly normal    Assessment:    Healthy female exam.  1. Morbid obesity 2. Tobacco use      Plan:     See After Visit Summary for Counseling Recommendations   Check PAP, GC/Clamydia, wet prep, HIV, RPR

## 2010-04-05 NOTE — Patient Instructions (Signed)
I'm very happy for you and finding the love of your life I will let you know of the PAP smear results Please schedule a follow up appointment as needed

## 2010-04-06 ENCOUNTER — Encounter: Payer: Self-pay | Admitting: Family Medicine

## 2010-04-06 LAB — GC/CHLAMYDIA PROBE AMP, GENITAL: GC Probe Amp, Genital: NEGATIVE

## 2010-04-16 ENCOUNTER — Other Ambulatory Visit: Payer: Self-pay | Admitting: Family Medicine

## 2010-04-17 NOTE — Telephone Encounter (Signed)
Refill request

## 2010-04-25 ENCOUNTER — Telehealth: Payer: Self-pay | Admitting: Family Medicine

## 2010-04-25 NOTE — Telephone Encounter (Signed)
Changing pharmacies to [facility] - needs her meds changed to new pharm. Singulair Trazadone Benazepril/HCTZ Lansoprazole Effexor Sucralfate Furosemide

## 2010-04-27 MED ORDER — FUROSEMIDE 20 MG PO TABS: 20.0000 mg | ORAL_TABLET | Freq: Every day | ORAL | Status: DC

## 2010-04-27 MED ORDER — SUCRALFATE 1 G PO TABS: 1.0000 g | ORAL_TABLET | Freq: Every day | ORAL | Status: DC

## 2010-04-27 MED ORDER — TRAZODONE HCL 100 MG PO TABS: 100.0000 mg | ORAL_TABLET | Freq: Every day | ORAL | Status: DC

## 2010-04-27 MED ORDER — VENLAFAXINE HCL 75 MG PO TABS: 150.0000 mg | ORAL_TABLET | Freq: Every day | ORAL | Status: DC

## 2010-04-27 MED ORDER — ESOMEPRAZOLE MAGNESIUM 20 MG PO CPDR: 20.0000 mg | DELAYED_RELEASE_CAPSULE | Freq: Two times a day (BID) | ORAL | Status: DC

## 2010-04-27 MED ORDER — MONTELUKAST SODIUM 10 MG PO TABS: 10.0000 mg | ORAL_TABLET | Freq: Every day | ORAL | Status: DC

## 2010-04-27 MED ORDER — BENAZEPRIL-HYDROCHLOROTHIAZIDE 20-12.5 MG PO TABS: 1.0000 | ORAL_TABLET | Freq: Every day | ORAL | Status: DC

## 2010-05-02 ENCOUNTER — Ambulatory Visit (INDEPENDENT_AMBULATORY_CARE_PROVIDER_SITE_OTHER): Payer: Medicare Other | Admitting: Psychology

## 2010-05-02 DIAGNOSIS — F39 Unspecified mood [affective] disorder: Secondary | ICD-10-CM

## 2010-05-02 NOTE — Progress Notes (Signed)
[name] reports that the Abilify hasn't touched her symptoms and she is having a lot of trouble with anger.  She is taking 5 mg.  We were supposed to see her back after two weeks to assess tolerance and increase if she was tolerating.  She had transportation issues and is just back today.  She describes difficulty sleeping, racing thoughts, persistent irritable mood.  She is especially irritable with her 47 y.o. son.  She recognizes that her approach to him is not helpful and is, at least somewhat, mediated by her mood issues.  She wants something to control her anger.  Calms herself down by shutting her door and smoking her cigarettes.    Schedule a therapy appt and did not attend.  She would prefer to see me.

## 2010-05-02 NOTE — Assessment & Plan Note (Signed)
Young Mania Rating Scale is 19-20 indicating a severe hypomania or mild mania.  No evidence of psychosis.  Predominate mood is irritable.  Thoughts are clear and goal directed.  Able to focus on questions and answer appropriately.  Denied homicidal and suicidal ideation.  Does report aggressive thoughts that she has not acted on.  See instructions for further plan.

## 2010-05-02 NOTE — Patient Instructions (Signed)
Please schedule a follow-in MDC:  April 18th at 10:00. Please start taking 10 mg of Abilify.  If in 2-3 days you are tolerating the medicine but do not notice a difference, increase to 15 mg a day.  Call [phone] with questions or concerns.  Do not increase beyond that without talking to someone first. Please schedule a behavioral medicine appt for:  April 19th at 3:00.

## 2010-05-09 ENCOUNTER — Ambulatory Visit: Payer: Medicare Other | Admitting: Psychology

## 2010-05-14 LAB — BASIC METABOLIC PANEL
BUN: 6 mg/dL (ref 6–23)
CO2: 25 mEq/L (ref 19–32)
Calcium: 9 mg/dL (ref 8.4–10.5)
Chloride: 106 mEq/L (ref 96–112)
Creatinine, Ser: 0.86 mg/dL (ref 0.4–1.2)
GFR calc Af Amer: >60 mL/min (ref >60)
GFR calc non Af Amer: >60 mL/min (ref >60)
Glucose, Bld: 94 mg/dL (ref 70–99)
Potassium: 4.3 mEq/L (ref 3.5–5.1)
Sodium: 139 mEq/L (ref 135–145)

## 2010-05-14 LAB — POCT HEMOGLOBIN-HEMACUE: Hemoglobin: 14.4 g/dL (ref 12.0–15.0)

## 2010-05-16 ENCOUNTER — Ambulatory Visit (INDEPENDENT_AMBULATORY_CARE_PROVIDER_SITE_OTHER): Payer: Medicare Other | Admitting: Family Medicine

## 2010-05-16 ENCOUNTER — Encounter: Payer: Self-pay | Admitting: Family Medicine

## 2010-05-16 ENCOUNTER — Ambulatory Visit: Payer: Medicare Other | Admitting: Psychology

## 2010-05-16 ENCOUNTER — Telehealth: Payer: Self-pay | Admitting: Psychology

## 2010-05-16 VITALS — BP 174/99 | HR 94 | Temp 98.1°F | Wt 341.7 lb

## 2010-05-16 DIAGNOSIS — I10 Essential (primary) hypertension: Secondary | ICD-10-CM

## 2010-05-16 DIAGNOSIS — N912 Amenorrhea, unspecified: Secondary | ICD-10-CM

## 2010-05-16 DIAGNOSIS — R110 Nausea: Secondary | ICD-10-CM | POA: Insufficient documentation

## 2010-05-16 LAB — POCT URINE PREGNANCY: Preg Test, Ur: NEGATIVE

## 2010-05-16 MED ORDER — PROMETHAZINE HCL 25 MG PO TABS: 25.0000 mg | ORAL_TABLET | Freq: Four times a day (QID) | ORAL | Status: DC | PRN

## 2010-05-16 NOTE — Assessment & Plan Note (Addendum)
Likely from gastroenteritis.  U preg negative.  Benign exam.  Will treat symptoms with Phenergan.

## 2010-05-16 NOTE — Patient Instructions (Signed)
You are not pregnant You need to discuss with your husband what you think would be best to do for contraception Please keep an eye on your blood pressure everyday for the next couple of weeks If it remains elevated >140/90  please return to clinic

## 2010-05-16 NOTE — Telephone Encounter (Signed)
[name] called at [phone] to report transportation issues keeping her from her 10:00 appt.  She was coming in to see Dr. Tye Savoy at 11:00 and wanted to be seen after that.  We had availability at [phone] but [name] said she thought she might be pregnant and needed to see Dr. Tye Savoy.  I asked her to call to reschedule when she had a chance.

## 2010-05-16 NOTE — Progress Notes (Signed)
  Subjective:    Patient ID: [name], female    DOB: [birth year], 45 y.o.   MRN: [id number]  HPI 1. Nausea / ? Pregnancy:  Pt comes to clinic because she is concerned that she may be pregnant.  She has been having frequent, unprotected sex with her partner.  He wants to have a baby, but she doesn't want one.  She has had nausea for the past 2 weeks a long with some breast tenderness.  This is how she always feels when she is pregnant.  She doesn't have periods so is not late.  Her partner is also nauseas and has been throwing up.  2. HTN:  She has been taking her medicines as prescribed.  She hasn't been checking her blood pressure at home.  She is sure that the reason why her blood pressure is up today is because she has been under so much stress with the concern of her being pregnant.   Review of Systems Denies fevers, chills, abdominal pain.  Endorses diarrhea.    Objective:   Physical Exam  Constitutional: No distress.       Morbidly obese.  Well hydrated  HENT:  Mouth/Throat: Oropharynx is clear and moist. No oropharyngeal exudate.  Eyes: Conjunctivae are normal.  Cardiovascular: Normal rate and regular rhythm.   Pulmonary/Chest: Effort normal and breath sounds normal. No respiratory distress. She has no wheezes.  Abdominal: Soft. Bowel sounds are normal. She exhibits no distension and no mass. There is no tenderness. There is no rebound and no guarding.  Lymphadenopathy:    She has no cervical adenopathy.  Skin: No rash noted.          Assessment & Plan:

## 2010-05-16 NOTE — Assessment & Plan Note (Signed)
Elevated today.  Could be related to stress.  Advised her to monitor her blood pressure everyday for the next couple of weeks.  If it remains elevated would like for her to return to clinic for likely medication adjustment.

## 2010-05-17 ENCOUNTER — Ambulatory Visit: Payer: Medicare Other | Admitting: Psychology

## 2010-06-04 ENCOUNTER — Other Ambulatory Visit: Payer: Self-pay | Admitting: Family Medicine

## 2010-06-04 NOTE — Telephone Encounter (Signed)
Refill

## 2010-06-12 NOTE — Procedures (Signed)
[name], [name]               ACCOUNT NO.:  [id number]   MEDICAL RECORD NO.:  [id number]          PATIENT TYPE:  OUT   LOCATION:  SLEEP CENTER                 FACILITY:  [facility]   PHYSICIAN:  Clinton D. Annamaria Boots, MD, FCCP, FACPDATE OF BIRTH:  1964-01-09   DATE OF STUDY:  02/21/2008                            NOCTURNAL POLYSOMNOGRAM   REFERRING PHYSICIAN:  Graciella Belton, MD   REFERRING PHYSICIAN:  Graciella Belton, MD   INDICATION FOR STUDY:  Hypersomnia with sleep apnea.   EPWORTH SLEEPINESS SCORE:  Epworth sleepiness score 6/24.  BMI 51.2.  Weight 347 pounds.  Height 69 inches.  Neck 17.5 inches.   MEDICATIONS:  Home medications are charted and reviewed.   A baseline diagnostic study on September 18, 2007, recorded AHI 8.5 per  hour.  CPAP titration is requested.   SLEEP ARCHITECTURE:  Total sleep time 350 minutes with sleep efficiency  88.7%.  Stage I was 0.9%.  Stage II 99.1%.  Stages III and REM were  absent.  Sleep latency 37 minutes.  Awake after sleep onset 6.5 minutes.  Arousal index 2.7.  Technician indicated that the patient took  trazodone, morphine, amitriptyline, and naproxen before arrival at  laboratory at 7 p.m.  Sleep architecture was significant for pattern  similar to a previous study, showing almost exclusively stage II sleep  consistent with medication effect.   RESPIRATORY DATA:  CPAP titration protocol.  CPAP was titrated to 18  CWP, AHI 0 per hour.  Adequate control was obtained at lower pressures  likely to be more comfortable, recommending an initial trial pressure  with 15 CWP, AHI 0 per hour.  She chose a medium ResMed Quattro full  face mask with heated humidifier.   OXYGEN DATA:  Snoring was prevented by CPAP with oxygen desaturation to  a nadir of 88% and mean oxygen saturation on CPAP of 94.2% with room  air.   CARDIAC DATA:  Normal sinus rhythm.   MOVEMENT/PARASOMNIA:  No significant movement disturbance.  No bathroom  trips.  Occasional  nonspecific limb jerk noted.   IMPRESSION/RECOMMENDATION:  1. Continuous positive airway pressure titration to recommended      initial trial pressure of 15 centimeters of water pressure, apnea-      hypopnea index 0 per hour.  She chose a medium ResMed full face      Quattro mask with heated humidifier.  2. Baseline diagnostic nocturnal polysomnogram on September 18, 2007, it      recorded an AHI of 8.5 per hour.  3. Unusual sleep histogram, almost exclusively stage II sleep.  This      pattern was seen on her initial study as well and probably reflects      her medications.      Clinton D. Annamaria Boots, MD, [facility], FACP  Diplomate, Tax adviser of Sleep Medicine  Electronically Signed     CDY/MEDQ  D:  02/27/2008 11:44:53  T:  02/27/2008 23:59:13  Job:  [id number]

## 2010-06-12 NOTE — Procedures (Signed)
[name], [name]               ACCOUNT NO.:  [id number]   MEDICAL RECORD NO.:  [id number]          PATIENT TYPE:  OUT   LOCATION:  SLEEP CENTER                 FACILITY:  [facility]   PHYSICIAN:  Clinton D. Annamaria Boots, MD, FCCP, FACPDATE OF BIRTH:  05[birth year]07/63   DATE OF STUDY:  09/18/2007                            NOCTURNAL POLYSOMNOGRAM   REFERRING PHYSICIAN:  Graciella Belton, MD   INDICATIONS FOR PROCEDURE:  Hypersomnia with sleep apnea.   RESULTS:  Epward sleepiness score 9/24, BMI 50.7, weight 343 pounds,  height 69 inches, neck 16.5 inches.   HOME MEDICATIONS:  Charted and reviewed.   SLEEP ARCHITECTURE:  Total sleep time 393 minutes with sleep efficiency  87.4%. Stage 1 was 1.1%; Stage 2, 93.5%; Stage 3, absent. REM 5.3% of  total sleep time. Sleep latency 21 minutes. REM latency 239 minutes.  Awake after sleep onset 35 minutes. Arousal index 5.5. Bedtime  medication included amitriptyline 75 mcg, Neurontin 800 mg, and  Trazadone 100 mg.   RESPIRATORY DATA:  Apnea/hypopnea (AHI) 8.5 per hour. Fifty-six events  were scored including 36 obstructive apnea's, 3 mixed apnea's, and 17  hypopnea's. Events were mostly associated with supine position. REM AHI  54.3. There were insufficient events to trigger over-ride CPAP titration  protocol on this study night. A diagnostic NP SG study was ordered.   OXYGEN DATA:  Moderate snoring with oxygen desaturation to a nadir of  78%. Mean oxygen saturation through the study 92.8% on room air.   CARDIAC DATA:  Normal sinus rhythm.   MOVEMENT/PARASOMNIA:  No significant movement disturbance. No bathroom  trips.   IMPRESSION/RECOMMENDATION:  1. Unusual sleep architecture, consistent with medication affect, all      stage 2 except for limited breakthrough into REM.  2. Mild obstructive sleep apnea/hypopnea syndrome, AHI 8.5 per hour      with events mostly while supine. Moderate snoring with oxygen      susurration to a nadir of 78%.  3. Scores  in this range may be addressed with CPAP therapy if      appropriate.  4. Consider conservative measures including weight loss, treatment for      upper airway congestion or obstructions,      encouraged to sleep off flat of back.  If these are insufficiency,      then an oral appliance, surgery, or CPAP could be offered. CPAP      would usually be the first choice. Consider return for CPAP      titration if appropriate.      Clinton D. Annamaria Boots, MD, FCCP, FACP  Diplomate, Tax adviser of Sleep Medicine  Electronically Signed     CDY/MEDQ  D:  09/26/2007 11:46:11  T:  09/26/2007 13:15:24  Job:  [id number]

## 2010-06-12 NOTE — Op Note (Signed)
[name], [name]               ACCOUNT NO.:  [id number]   MEDICAL RECORD NO.:  [id number]          PATIENT TYPE:  AMB   LOCATION:  [place]                          FACILITY:  [facility]   PHYSICIAN:  Jessy Oto, M.D.   DATE OF BIRTH:  05-02-1965   DATE OF PROCEDURE:  02/04/2008  DATE OF DISCHARGE:                               OPERATIVE REPORT   PREOPERATIVE DIAGNOSIS:  Recurrent right long finger trigger finger  (stenosing tenosynovitis of the flexor tendon sheath at the A1 pulley).   POSTOPERATIVE DIAGNOSIS:  Stenosis occurring at either end of previous  release of A1 pulley causing persistent right trigger finger of the long  finger.   PROCEDURE:  Repeat right long finger trigger finger release.   SURGEON:  Jessy Oto, M.D.   ASSISTANT:  None.   ANESTHESIA:  Bier block, Dr. Crissie Sickles. Conrad [place], M.D.   TOTAL TOURNIQUET TIME:  27 minutes.   Local infiltration with Marcaine, 0.5% plain, 5 mL.   FINDINGS:  The patient was found to have tendon sheath scarring at  either end of the previous release that was undertaken of the A1 pulley.  Re-release was performed with revision of scar tissue overlying the  flexor tendons and the patient was able to actively flex and extend the  long finger without any evidence of catching or triggering at the end of  the procedure.   HISTORY OF PRESENT ILLNESS:  This patient is a 45 year old right-hand  dominant female, no history of diabetes or rheumatologic disorder.  She  underwent right long finger trigger finger release in June 2009.  Postoperatively, developed some discomfort and pain that was  significant, recommended therapy, however, the patient was unable to  attend therapy.  After a period of nearly 3-4 months, began experiencing  increasing feelings of triggering again in the same long finger.  She  underwent steroid injection which only temporarily relieved her  discomfort with recurrent pain, triggering of the long finger of the  right hand.  The patient was brought to the operating room to undergo  redo trigger finger release.  Intraoperative findings as above.   DESCRIPTION OF PROCEDURE:  After adequate Bier block anesthesia, right  upper extremity was prepped from the finger tips to the mid forearm with  DuraPrep solution and draped in the usual manner.   Loupe magnification was used during this procedure along with headlight.  Incision was approximately 2-1/2 cm in length, transverse, through the  old incision scar at the distal palmar crease overlying the patient's  long finger metacarpophalangeal joint.  Incision through skin, 15-blade  scalpel and then the immediate subdermal layers and subcu spread using  Metzenbaum and Steven scissors.  The fascia extending centrally were  divided, care was taken to identify the neurovascular bundles both  medial and lateral.  These were retracted with retractors down to the  flexor tendon.  Flexor tendon sheaths identified with proximal scarring,  felt to be present at the area of previous release as well as distally.  Incision was then made into the flexor tendon sheath using a Estate manager/land agent  and this was continued proximally over a period of about 2 cm  proximally to the proximal edge of the A1 pulley and then continued  distally finding the edge of the scar tendon and then releasing distally  an additional 2.5-3 cm.  When this was completed, then the patient was  able to volitionally move the finger and perform flexion and extension  maneuvers demonstrating no sign of persistence of the trigger finger.   Irrigation was carried out.  Skin edges were then approximated with  interrupted 4-0 nylon suture in simple fashion and then Dermabond  applied.  Dry dressing with Adaptic 4 x 4s, 2 x 2s held in place with  clean and Coban.  Tourniquet was released.  Total tourniquet time was 27  minutes.   Note, the time out was carried intraoperatively and preoperatively.  The   patient had Marcaine to the hand where the procedure was to be performed  in the preop holding area.   POSTOPERATIVE CARE:  The patient will elevate the hand for a total of 2  days, use ice locally for 30 minutes at a time.  Take Percocet 5/325 one-  two every 4-6 hours p.r.n. pain, be seen back in the office in 1 week  for removal of sutures.  She can remove her current bandage at the  fourth day postop, applied Band-Aid.      Jessy Oto, M.D.  Electronically Signed     [name]/MEDQ  D:  02/04/2008  T:  02/05/2008  Job:  [id number]

## 2010-06-12 NOTE — Op Note (Signed)
NAMEELEN, RICKETTS               ACCOUNT NO.:  [id number]   MEDICAL RECORD NO.:  [id number]          PATIENT TYPE:  AMB   LOCATION:  [place]                          FACILITY:  [facility]   PHYSICIAN:  Jessy Oto, M.D.   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   PROCEDURE:  Right middle finger A1 pulley release.   SURGEON:  Jessy Oto, MD   ASSISTANT:  None.   ANESTHESIA:  Bier block at the level of mid forearm.   TOURNIQUET:  250 mmHg.   TOTAL TOURNIQUET TIME:  14 minutes for procedure.   Local infiltration with Marcaine 0.5%  plain, 5 mL.   HISTORY OF PRESENT ILLNESS:  A 45 year old female with a history of  multiple lumbar procedures.  She is in chronic pain management with  Dr.Mark Hardin Negus, on Kadian as well as Norco 10/325 mg t.i.d.  She has  been experiencing triggering of her fingers, left arm and right middle,  finger for the past year and a half.  Seen in the office and underwent  trigger finger injection of the right middle finger with temporary  relief of discomfort and triggering.  She was taking Naprosyn as well.  Trigger finger though has recurred, and she has persistent severe pain  over the volar aspect of the long finger or middle finger flexor tendon  at the level of the MCP joint consistent with A1 pulley stenotic  tenosynovitis.  As she has failed conservative management, she was  brought to the operating room to undergo a right long finger trigger  release.   DESCRIPTION OF PROCEDURE:  After adequate Bier block anesthesia, the  right upper extremity was prepped from fingertips to mid forearm with  DuraPrep  solution, and draped in the usual manner.  The patient had  testing of skin sensation prior to infiltration transversely at the  level of the distal palmar crease and the long finger MCP joint.  This  was superficially infiltrated with 5 mL of Marcaine 0.5% plain.  Initial  incision approximately is centimeter and a half in length, the mid  portion of the incision directly over the expected flexor tendon sheath  extended both radial and ulnarward at the level of the distal palmar  crease through the skin layer only.  Subcutaneous layers were then  carefully spread using Stevens scissors in the midline, overlying the  flexor tendon.  Developing this from proximal to distal, then Ragnell  retractors were inserted both radial and ulnarward protecting the soft  tissue structures both ulnar and radial and the expected neurovascular  bundles here.  Flexor tendon sheath easily encountered.  Grasped using a  Adson's forceps and then incised using Stevens scissors and extended  distally approximately 5 mm to 7 mm and then proximally elevating the  proximal skin fold and under direct visualization with loop  magnification, dividing the A1 pulley distally as well as proximally  about a centimeter in either direction.  With this, then the patient was  able to actually flex and extend the finger demonstrating no further  triggering at this point.  His tourniquet time was less than 20 minutes.  Initial irrigation was carried out and then the skin edges approximated  with interrupted horizontal mattress sutures of 4-0 nylon and the skin  itself was then closed using Dermabond.  This was completely dried, then  4 x 4 folded to a 2 x 2 size placed over the area of incision, held in  place with a 1 inch Kling and then 1 inch Coban applied. The patient  then returned to recovery room in satisfactory condition following the  release of tourniquet.  Total tourniquet time 15 minutes 250 mmHg.  Note  that the  loop magnification was used throughout the entire case.   POSTOPERATIVE CARE:  The patient will elevate the hand for a period of 1-  2 days, use ice as appropriate 15-20 minutes on and then off.  To be  seen back in the office in 7-10 days for removal of sutures.  She can  keep her current dressing for a total of 4 days then remove and apply  Band-Aid.  She is to call our office for a followup visit [phone].      Jessy Oto, M.D.  Electronically Signed     [name]/MEDQ  D:  07/03/2007  T:  07/04/2007  Job:  [id number]

## 2010-06-15 NOTE — Discharge Summary (Signed)
NAMEGEAN, STEFANOWICZ NO.:  [id number]   MEDICAL RECORD NO.:  [id number]                   PATIENT TYPE:  IPS   LOCATION:  0300                                 FACILITY:  [facility]   PHYSICIAN:  Rulon Eisenmenger, M.D.              DATE OF BIRTH:  1965/07/30   DATE OF ADMISSION:  07/21/2003  DATE OF DISCHARGE:  07/28/2003                                 DISCHARGE SUMMARY   IDENTIFYING DATA:  This is 45 year old African-American female, divorced,  voluntarily admitted.  Relapsed on cocaine and alcohol two months ago.  Having some financial stressors, arguing with church members about  responsibilities, feeling overwhelmed then relapsed.  Drinking alcohol  sporadically and using cocaine, escalating in use over the last week.  Endorsing mood swings, irritability and erratic sleep.  Second admission to  [facility].  Last in September of 2003.  Possible history of PTSD or impulse  control disorder and history of depression and substance abuse.   MEDICATIONS:  Depo-Provera, Percocet, Prevacid, albuterol, Effexor.   ALLERGIES:  METHADONE.   PHYSICAL EXAMINATION:  Essentially within normal limits.  Neurologically  nonfocal.   LABORATORY DATA:  Routine admission labs within normal limits.   MENTAL STATUS EXAM:  Fully alert but withdrawn with poor eye contact.  Cooperative.  Speech mumbled, soft.  Mood depressed.  Affect restricted.  Thought processes goal directed.  Positive passive suicidal ideation without  plan.  No psychotic symptoms.  Cognitively intact.  Judgment and insight  fair.   ADMISSION DIAGNOSES:   AXIS I:  1. Major depressive disorder, recurrent.  2. Polysubstance abuse.   AXIS II:  Deferred.   AXIS III:  1. Status post left carpal tunnel release.  2. Gastroesophageal reflux disease.  3. Asthma.   AXIS IV:  Moderate (stressors related to problems with primary support  group).   AXIS V:  24/62.   HOSPITAL COURSE:  The patient was admitted  and ordered routine p.r.n.  medications and underwent further monitoring.  Was encouraged to participate  in individual, group and milieu therapy.  The patient was placed on safety  checks every 15 minutes and Effexor was optimized to 112.5 mg daily.  Other  medications were restarted and Librium protocol was used.  The patient  tolerated detox and stabilization on medications with improvement.   CONDITION ON DISCHARGE:  Discharged in improved condition.  Mood was  euthymic.  Affect brighter.  No acute withdrawal symptoms.  The patient  reported motivation to be compliant with the aftercare plan.  Motivated to  remain abstinent and seek all substance abuse treatment resources available.  Given medication education.   DISCHARGE MEDICATIONS:  1. Zocor.  2. Singulair.  3. Lotensin.  4. Hydrochlorothiazide 12.5 mg.  5. Flonase as directed.  6. Prevacid as directed.  7. Symmetrel 100 mg b.i.d.  8. Effexor XR 150 mg daily.  9. Seroquel 25 mg b.i.d. and 250 mg  q.h.s.  10.      Trazodone 100 mg q.h.s.  11.      Lunesta 3 mg q.h.s. p.r.n.   FOLLOW UP:  The patient is to follow up on July 29, 2003 at 9 a.m. with Dr.  Louanne Skye.  The patient needs to call [place] to arrange  appointment time.  [facility] follow-up on July 29, 2003 at 12:30 p.m. and [place] within seven days.   DISCHARGE DIAGNOSES:   AXIS I:  1. Major depressive disorder, recurrent.  2. Polysubstance abuse.   AXIS II:  Deferred.   AXIS III:  1. Status post left carpal tunnel release.  2. Gastroesophageal reflux disease.  3. Asthma.   AXIS IV:  Moderate (stressors related to problems with primary support  group).   AXIS V:  Global Assessment of Functioning on discharge 55.                                               Rulon Eisenmenger, M.D.    JEM/MEDQ  D:  08/19/2003  T:  08/20/2003  Job:  [id number]

## 2010-06-15 NOTE — Discharge Summary (Signed)
[name], [name]               ACCOUNT NO.:  [id number]   MEDICAL RECORD NO.:  [id number]          PATIENT TYPE:  INP   LOCATION:  5035                         FACILITY:  [facility]   PHYSICIAN:  Jessy Oto, M.D.   DATE OF BIRTH:  [birth year]   DATE OF ADMISSION:  04/04/2005  DATE OF DISCHARGE:  04/09/2005                                 DISCHARGE SUMMARY   ADMISSION DIAGNOSES:  1.  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  2.  Asthma.  3.  Gastroesophageal reflux disease.  4.  Migraine headaches.  5.  Hypertension.  6.  History of cervical cancer treated with conization.  7.  Status post bilateral breast lumpectomy, benign.  8.  Three lumbar spinal surgeries 1995 and 2000.  9.  Bilateral carpal tunnel release.   DISCHARGE DIAGNOSES:  1.  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  2.  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  3.  Postoperative anemia.  4.  Mild tachycardia resolved at discharge.  5.  Asthma.  6.  Gastroesophageal reflux disease.  7.  Migraine headaches.  8.  Hypertension.  9.  History of cervical cancer treated with conization.  10. Status post bilateral breast lumpectomy, benign.  11. Three lumbar spinal surgeries 1995 and 2000.  12. Bilateral carpal tunnel release.   PROCEDURE:  1.  On April 04, 2005, the patient underwent central laminectomy L3-4, L4-5      with left-sided total facetectomy L3-4, L4-5.  2.  Decompression of left L4 and left L5 and left S1 nerve roots.  3.  Posterior lumbar interbody fusion utilizing local and allograft symphony      bone graft with internal fixation utilizing Monarch pedicle screws and      rods L3-L5 with augmentation utilizing infused  BMP.  4.  Left-sided placement of bone morphologic protein within BAK cages left      L4-5 level.  5.  Bone graft left L4-5 BAK cages performed by Dr. Louanne Skye, assisted by      Phillips Hay P.A.C. under general anesthesia.   CONSULTATIONS:  None.   BRIEF HISTORY:  The patient is a 45 year old female with lumbar degenerative  disk disease status post L3-4 and L4-5 anterior lumbar diskectomy and fusion  utilizing BAK cages.  This fusion was performed in 1995.  She has had  persistent back pain with progressive pain in the left lower extremity.  Extensive evaluation has demonstrated subsidence of the cages at both the L3-  4, L4-5 level.  She has been under constant pain management.  She continues  to have significant discomfort.  It was felt she would require surgical  intervention and was admitted for the procedure as stated above.   BRIEF HOSPITAL COURSE:  The patient tolerated the procedure under general  anesthesia without complications.  On the first postoperative day,  neurovascular motor function of the lower extremities was noted to be  intact.  Her Hemovac drain was discontinued without difficulty.  Dressing  changes were done daily thereafter and the patient's wound was found to be  healing well without signs of infection or drainage.  Postoperatively, the  patient was not having nausea or vomiting.  She did have bowel sounds.  She  did continue under ileus precautions utilizing clear liquids until she was  having flatus.  At that time, she was advanced to a regular diet.  Physical  therapy was initiated on the first postoperative day for ambulation and gait  training.  The patient did have a Aspen LSO to utilize.  This was donned and  doffed at bed side.  Eventually, the patient was able to demonstrate  independence with this but had significant difficulty doing this particular  activity. Once out of bed, she was able to ambulate fairly well.  Occupational therapy assisted her  with ADLs and she was tolerating this  fairly well also.  She was able to be instructed in showering prior to her  discharge.  She was ambulating as much as 70 feet prior to discharge.  The  patient had one episode of tachycardia as high as 115 on the third  postoperative day; however, this resolved with use of her medications.  She  initially was treated with PCA analgesics and weaned to oral analgesics  without difficulty.  The patient continued to complain of some muscle spasms  as well as radicular symptoms into the lower extremities.  It was felt  Lyrica would be an acceptable medication.  Unfortunately, she could not  afford it and her insurance did not cover it and therefore samples were made  available for her through our office.   On April 09, 2005, the patient was able to demonstrate donning and doffing  the brace much better.  It was felt she would require a physical therapy  evaluation and this was arranged for her at discharge as well.   PERTINENT LABORATORY DATA:  Indicated patient with posthemorrhagic anemia  with hemoglobin 10.3, hematocrit 29.3 postoperatively.  Chemistry studies on  admission were within normal limits with the exception of glucose 102, BUN  4, albumin 3.4.  Postoperative chemistry studies with glucose 132, BUN 4,  calcium 8.0.  It is indicated in the chart that the patient did receive 1  unit of autologous packed red blood cells intraoperatively.   CONDITION ON DISCHARGE:  Stable.   PLAN:  The patient is discharged to home with arrangements for home health  physical therapy evaluation through [name].  She will wear her brace at all  times when out of bed and utilize a walker for ambulation.  No driving or  lifting.  She will be allowed to shower at home, with daily dressing  changes.  She is instructed in a regular diet with plenty of water.   MEDICATIONS:  Include Percocet for pain, Robaxin for muscle spasms as needed.  She is instructed in  over-the-counter stool softener twice daily  and laxatives as needed.  She will come by Dr. Otho Ket office to pick up  samples of Lyrica.  The patient is instructed to follow up with Dr. Louanne Skye in  one week and was advised to call to arrange the appointment.  All questions  encouraged and answered.  She will resume all of  her other home medications.      Epimenio Foot, P.A.      Jessy Oto, M.D.  Electronically Signed    SMV/MEDQ  D:  05/27/2005  T:  05/27/2005  Job:  [id number]

## 2010-06-15 NOTE — Op Note (Signed)
NAME:  [name], [name]               ACCOUNT NO.:  [id number]   MEDICAL RECORD NO.:  [id number]          PATIENT TYPE:  INP   LOCATION:  4                         FACILITY:  [facility]   PHYSICIAN:  Jessy Oto, M.D.   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   PROCEDURES:  Left L2-3 transforaminal lumbar interbody fusion with  extension of posterolateral fusion to the L2-3 level, from L3 to L5  using a combination of Vitoss and local bone graft, redo  instrumentation, exchange of rod only from L3 to L5, inconclusive of the  L2 level; the transforaminal lumbar interbody fusion performed was with  a 9-mm Leopard cage with local bone graft.   SURGEON:  Jessy Oto, M.D.   ASSISTANT:  Epimenio Foot, P.A.-C.   ANESTHESIA:  General via orotracheal intubation, Dr. Glennon Mac.   SPECIMENS:  None.   ESTIMATED BLOOD LOSS:  700 mL.   COMPLICATIONS:  None.   DRAINS:  Foley catheter to straight drain, and Hemovac x1.   FLUIDS RETURNED:  300 mL of Cell Saver blood.   DISPOSITION:  The patient returned to the PACU in good condition.   HISTORY OF PRESENT ILLNESS:  A 45 year old female, who has a significant  drug history.  She has undergone previous anterior lumbar interbody  fusion with BAK cages.  These went on to subside into nonunion and she  underwent a posterolateral fusion from L3 to L5 using DePuy Monarch  pedicle screws and rods and bone graft, and did have BMP applied to the  intervertebral disk space.  The patient had been treated conservatively  since that  time, did well for a short period of time, then had  increasing pain and radiation to the left thigh.  Studies indicated disk  herniation on the left side into the foramen, affecting the left L2  nerve root at the L2-3 level.  She had steroid injections done, all  without significant long-term relief of pain, persistent weakness in  left hip flexion and adduction.  She was brought to the operating room  to undergo excision of the disk herniation, left side, L2-3, and TLIF on  the left side with extension of the fusion from L3 to 5 to the L2-3  level.   INTRAOPERATIVE FINDINGS:  Severe left L2 neural foraminal narrowing  secondary to protruded disk, as well as spondylytic spurs off the  posterolateral aspect of the disk space on the left side, and pressing  on the left L2 nerve root within the foramen.   DESCRIPTION OF PROCEDURES:  After adequate general anesthesia, a Foley  catheter was placed.  Standard preoperative antibiotics.  The patient  was rolled to  a prone position using the Clinton table and rotation  mechanism, using both ventral and dorsal pads, pads over both iliac  crests and a chest prepped and draped, and thigh pads as well.  All  pressure points were well padded.  Dr. Glennon Mac was kindly helping and  insuring that the patient was well padded in all areas.  The arms at the  sides with arm holders, 90 degrees at the elbow and shoulder.  The  patient underwent standard prep and DuraPrep solution from the lower  dorsal spine to the midsacral segment, draped in the usual manner.  Iodine Vi-Drape was used.  The old incision scar was ellipsed using a 10-  blade scalpel and extended superiorly an additional 2 inches for a total  of nearly an 8-inch incision.  In the midline, carried down to the  lumbodorsal fascia.  The spinous processes of L2 and of L1 were  identified, and the incision continued in the midline over both sides of  the spinous processes of L1 and L2, and then in  the midline, continued  caudally to the S1 level.  Cobbs were used to elevate the paralumbar  muscles on both sides, exposing out over the capsule of L2-3 and at the  L1-2 level as well, preserving the capsule at L1-2 on both sides.   A midline incision was carried down, and then out lateral to the area of  the fixation, bilaterally exposing the previous pedicle screws and rods  at the L3 to L5 segment.  A Viper retractor was used during this portion  of the procedure, as well as cerebellar retractors.  The Cell Saver was  used during the case.   Intraoperative C-arm demonstrating clamps on the spinous process of L2  and a Penfield #4 placed out over the transverse process of L2 and the  pedicle of L2.  This area was marked for continued identification during  the procedure.  Careful exposure was carried out to the transverse  process of L2 bilaterally, making a pocket between this area and the  transverse process of L3 previously.  Debriding carefully any soft  tissue over the rods and screws, both sides.  At this point, then, the  caps of the previous rod-screw connectors were then carefully loosened  and removed.  This was done without difficulty.  The rods were then  removed from the screws on the right side and the left side without  difficulty.  The screw heads were then debrided of any soft tissue in  the fastener areas at L3, L4 and L5, both sides.  With this completed,  these areas were packed for hemostasis purposes.  The lateral gutter  between the transverse process of L2 and L3 was also packed.  The C-arm  fluoro was brought into view, and an awl was then used to make an entry  point on the right side at the intersection of the superior articular  process of L2 with the transverse process of L2 and observed on the C-  arm to be in excellent position and alignment.  After making an initial entry point in the lateral aspect of the patient's pedicle, a pedicle  probe  (straight) was then passed down the pedicle into the vertebral  body on the right side to a point of 45 mm, observed on C-arm fluoro to  be in excellent position and alignment.  A ball-tipped probe was used to  probe the channel, insuring patency of the pedicle, opening in  the  pedicle channel, with no evidence of breaching of cortex.  Tapping was  performed using a 5.5 tap, and a 45-mm x 6.25 screw was used, after  first decorticating the transverse process and lateral aspect of the  superior articular process of L2 along the area of pars, decorticating  the right L2-3 facet.  Vitoss was then first obtained.  Note that  aspiration was obtained from the pedicle on the right side of the  vertebral body using the Vitoss aspiration equipment.  We aspirated at  least 12 mL of bone marrow material from the vertebral body via the T-  handled bone aspirate equipment.  This was then placed over Vitoss, a 10-  cc strip.  This was cut in half and cut in sections similar to bone  graft, then placed over the posterolateral aspect of the right side,  extending from L2 to L3, and over the facet at L2-3 on the right.  The  6.25 x 45-mm screw was then placed without difficulty.  Intraoperative C-  arm fluoro demonstrated the screw in good position and alignment.  Attention then turned to the left side, where, similarly, an awl was  then placed in the lateral aspect of the pedicle at the L2 level and  seen to be in excellent position and alignment on the C-arm fluoro.  A  straight pedicle finder was then used to probe the pedicle, and this was  done without difficulty.  The pedicle channel was then carefully probed  using a ball-tipped probe, insuring its patency.  Once this was  completed, then tapping was performed using a 5.5 tap, and a 45 mm x  6.25 screw was then placed on the left side at the L2 level, following  decortication of the transverse process, the lateral aspect of the L2  and L3 superior  articular processes and the residual portion of the  transverse process of L3.  The Vitoss present was then carefully applied  to these areas, and then the screw then inserted.  A 6.25 x 45-mm screw  was inserted on the left side and good position and alignment seen on  the C-arm fluoro.  Attention then turned to the TLIF, where the left-  sided hemilaminectomy was performed.  Really, this was a subtotal  hemilaminectomy with resection of a small portion of the inferior half  of the spinous process of L2, and continuing this along the lamina on  the left side using osteotomes and a 3-mm Kerrison in order to resect  the left hemilamina in a semihemi.  The inferior articular process of L2  was resected in total, and the superior articular process of L3 was also  resected, opening the neural foramen  on the left side over the L2 nerve  root.  The ligamentum flavum was then resected off the residual portion of the superior portion of the lamina of L3, and the lateral recess  completely decompressed on the left side to the level of the left L3  pedicle, and the superior articular process of L3 was then resected back  to the superior surface of the L3 pedicle.  This was carried out  laterally.  The inferior articular process of L2 was resected to the  inferior aspect of the pedicle of L2.  The ligamentum flavum was further  debrided such that the L2 nerve root was freely visible and well  decompressed.  Retracting the nerve root superiorly and the thecal sac  at the L2-3 level  medially, the disk space at L2-3 was identified.  Bleeders were controlled using electrocautery.  Incision was made in the  disk with a 15-blade scalpel.  The disk space was then debrided of disk  material using pituitary rongeurs.  The endplates were then curettaged  using a lamina spreader for additional help with exposure and  distraction of the disk space.  Curettage was carried out, removing the  cartilaginous  endplates using the straight curets, then the upbiting to  the left and upbiting to the right curets, debriding disk as much as  possible.  Then, using ring curets, additional debridement was carried  out of the disk space.  Pituitary rongeurs were used to debride the disk  fully.  Dilation of the disk was then carried out with an 8, 9, and  finally a 10-mm dilator.  Attempted placement of a 10-mm spacer,  however, was unsuccessful; so, then, a 9-mm trial spacer was inserted  and packed into place, and this provided an excellent fit.  A 9-mm  Leopard cage was then packed with local bone graft harvested from the  left facet at the L2-3 level, as well as posterior laminectomy bone  material.  This had a good appearance.  Additional bone graft was then  placed within the intervertebral disk space via the left laminotomy  site, placing bone graft adequately, and then carefully impacting this.  The Leopard cage was then carefully seated into the disk space using the  lamina spreader provided.  The cage was then impacted into place,  obtaining the correct depth and rotating it transversely and kicking  across the midline using the kicker impactors.  The lamina spreader was  removed, with the C-arm fluoro demonstrating the cage in excellent  position and alignment, and both the L2 and L3 nerve roots were well  decompressed within their foramina.   A 95-mm length rod was then carefully bent to be accepted by the  fasteners previously and to be accepted into the L2 screw fastener head.  This was then carefully placed into the screw heads, extending from L5  to L2 on the left side.  Caps for the screw heads were then carefully  placed at L5, L4 and L3 on the left side.  Then, using a rod pusher, the  final cap was applied.  The L5 and L4 caps were then carefully tightened  and torqued to 80 foot pounds.  At the L3-4 interval, then, compression was obtained, and the cap at the L3 level was then  tightened to 80 foot  pounds.  Compression was then obtained between L2 and L3 on the left  side fasteners, and this allowed for the rod to be carefully then  fastened to the fastener at 80 foot pounds using a antitorque device and  the capsule fastener screwdriver.  Once this was completed, then  attention was turned to the right side, where, similarly, a 95-mm length  rod was then carefully contoured.  This was fitted within screw fastener  heads.  The L5, L4 and L3 levels were then carefully attached and  torqued to 80 foot pounds on the right side.  And then, with compression  at the L2-3 level, the L2 fastener was then torqued to 80 foot pounds,  attaching the screw to the rod appropriately.  This completed the TLIF  and the posterolateral instrumentation and posterolateral fusion portion  of the case.  Irrigation was then performed of the laminotomy region on  the left side  carefully.  A Penfield #4 was used to probe the area,  demonstrating its patency.  A hockey stick neural probe easily passed  down to the L2 neural foramen, as well as the L3 neural foramen.  Irrigation was performed.  The soft tissues were allowed to fall back  into the space provided.  A Hemovac drain was placed in the depths of  the incision.  The lumbodorsal fascia was then approximated to the  interspinous ligament superiorly at L1 and L2 using #1 Vicryl sutures.  The muscle layers over the laminotomy region were then approximated  loosely using interrupted #1 Vicryl sutures.  The lumbodorsal fascia was  approximated in the midline with interrupted, figure-of-eight, simple  sutures orthopnea #1 Vicryl.  Deep subcutaneous layers were approximated  with interrupted #1 Vicryl sutures, and then the patient's adipose  layer, which was quite large, at least 3 to 4 inches in width, was then  closed using retention sutures of #1 Prolene with red rubber Quentin Cornwall  bridges performed in far-near, near-far sutures.   Stainless steel  staples were then used to close the skin.  Xeroform was applied to the  staple line, as well as under the stay sutures and retention sutures in  order to prevent irritation here.  Then, 4 x 4's and ABD pads were fixed  to the skin with Hyperfix tape.  The Hemovac was charged and then taped  to the skin using pink tape.  The patient was then returned to a supine  position, reactivated, extubated and returned to the recovery room in  satisfactory condition.  All instrument and sponge counts were correct.      Jessy Oto, M.D.  Electronically Signed     [name]/MEDQ  D:  03/04/2006  T:  03/04/2006  Job:  [id number]

## 2010-06-15 NOTE — Op Note (Signed)
NAME:  [name], [name]                         ACCOUNT NO.:  [id number]   MEDICAL RECORD NO.:  [id number]                   PATIENT TYPE:  AMB   LOCATION:  DAY                                  FACILITY:  [facility]   PHYSICIAN:  Jessy Oto, M.D.                DATE OF BIRTH:  [birth year]   DATE OF PROCEDURE:  08/30/2002  DATE OF DISCHARGE:                                 OPERATIVE REPORT   PREOPERATIVE DIAGNOSIS:  Right wrist moderately severe carpal tunnel  syndrome.   POSTOPERATIVE DIAGNOSIS:  Right wrist moderately severe carpal tunnel  syndrome.   OPERATION/PROCEDURE:  Right open carpal tunnel release.   SURGEON:  Jessy Oto, M.D.   ANESTHESIA:  Sela Hilding, M.D. - Bier block supplemented with local  infiltration of Marcaine 0.5% plain, 3 mL.   ESTIMATED BLOOD LOSS:  2 mL.   DRAINS:  None.   TOTAL TOURNIQUET TIME:  35 minutes.   BRIEF CLINICAL HISTORY:  The patient is a 45 year old female who has  undergone previous lumbar surgery.  She presented with increasing arm and  shoulder pain over the past half year.  She underwent initial evaluation.  Cervical studies demonstrated no significant abnormality.  Followup EMG  nerve conduction studies demonstrated moderately severe right carpal tunnel  syndrome greater than left.  She complains of increasing numbness that is  persisting, particularly under her right long finger, and has not resolved  with use of splinting.  She was brought to the operating room to undergo  right carpal tunnel release.   INTRAOPERATIVE FINDINGS:  The patient was found to have typical hour-glass  appearance to the right median nerve at the level of the transverse carpal  ligament causing significant compression of the median nerve out into the  mid palmar region.  This was released distally until no further fibers were  found to be present or evident and no further compression of the nerve  observed.   DESCRIPTION OF PROCEDURE:  After  adequate bier block anesthesia, the right  upper extremity prepped with DuraPrep solution from fingertips to elbow,  draped in the usual manner.  Standard preoperative antibiotics using Ancef.  Approximately one inch incision made over the right volar aspect of the hand  just ulnar 2 mm to the thenar crease.  This incision carried sharply through  the skin and subcutaneous layers and then carried obliquely across the  distal wrist crease ulnarward.  Through the skin and subcutaneous layers,  using the 15-blade scalpel, a small Weitlaner was inserted.  The fascial  layers of the distal palm and wrist area and distal forearm were identified.  These were incised using sharp scissors extended proximally and releasing  the fascial overlying the median nerve.  The median nerve identified and the  incision then carried distally to the transverse carpal ligament.  Freer  elevator placed beneath the transverse carpal ligament, identified the  correct plane here.  A transverse carpal ligament then divided over the  [facility] releasing the ligament.  Center retractor used to carefully  elevate the skin folds, both distally and proximally, to allow for further  incisions of the fascia layers, the palmar fascia, until the superficial  carpal arch was identified.  This then was the end of the dissection  distally and proximally the fascia layers were felt to no longer compress  the median nerve.  The central median artery appeared to fill nicely  following the decompression.  There was no need for epineurolysis here.  The  motor branched to the median nerve identified and was normal in appearance.  No sign of synovitis was evident.  The findings of the transverse carpal  ligament were that it was hypertrophic and causing significant median nerve  compression at the level of the transverse carpal ligament.  Irrigation was  then performed.  Skin then reapproximated with interrupted horizontal   mattress sutures of 4-0 nylon.  Adaptic 4 x 4's affixed to the skin with  sterile Webril.  A well-padded volar splint identified across the wrist,  holding the wrist in extension of about 20-25 degrees.  Tourniquet was then  released.  Total tourniquet time 35 minutes.  The patient was then  reactivated and returned to the recovery room in satisfactory condition.  All instrument and sponge counts were correct.                                               Jessy Oto, M.D.    Liz Malady  D:  08/31/2002  T:  08/31/2002  Job:  [id number]

## 2010-06-15 NOTE — Op Note (Signed)
   [name], [name]                         ACCOUNT NO.:  [id number]   MEDICAL RECORD NO.:  [id number]                   PATIENT TYPE:  AMB   LOCATION:  DSC                                  FACILITY:  [facility]   PHYSICIAN:  Sammuel Hines. Daiva Nakayama, M.D.              DATE OF BIRTH:  12/16/1965   DATE OF PROCEDURE:  03/17/2002  DATE OF DISCHARGE:  03/17/2002                                 OPERATIVE REPORT   PREOPERATIVE DIAGNOSES:  Sinus tracts near both nipples from prior surgery.   POSTOPERATIVE DIAGNOSES:  Sinus tracts near both nipples from prior surgery.   OPERATION PERFORMED:  Breast biopsy and excision of sinus tracts on both  breasts.   SURGEON:  Sammuel Hines. Marlou Starks, M.D.   ANESTHESIA:  Local with IV sedation.   DESCRIPTION OF PROCEDURE:  After informed consent was obtained, the patient  was brought to the operating room and placed in supine position on the  operating table.  After adequate sedation was achieved, the patient's  breasts were prepped with Betadine and draped in the usual sterile manner.  Each breast was infiltrated near the sinus tract at the base of the nipple  with 0.25% Marcaine.  Each sinus tract was excised completely sharply using  a 15 blade knife.  Once this was complete, the skin edges were then closed  with a running 4-0 Monocryl subcuticular stitches and Dermabond dressings  were used to seal the skin.  The patient tolerated the procedure well.  At  the end of the case all sponge, needle and instrument counts were correct.  The patient was awakened and taken to the recovery room in stable condition.                                                   Sammuel Hines. Daiva Nakayama, M.D.    PST/MEDQ  D:  03/18/2002  T:  03/19/2002  Job:  [id number]

## 2010-06-15 NOTE — Op Note (Signed)
[facility], [facility], [facility]  Patient:    [name] I Visit Number: [id number] MRN: [id number]          Service Type: DSU Location: [name] Attending Physician:  Inge Rise Proc. Date: 02/27/98 Admit Date:  02/27/1998                             Operative Report  CCS NUMBER:                   [id number]  PREOPERATIVE DIAGNOSIS:       Lactiferous duct sinus infection.  POSTOPERATIVE DIAGNOSIS:      Lactiferous duct sinus infection.  OPERATION:                    Excision lactiferous duct sinus.  SURGEON:                      Jone Baseman. Mendel Ryder, M.D.  ANESTHESIA:                   General.  DESCRIPTION OF PROCEDURE:     The patient was brought to the operating room and  placed on the operating table in a comfortable supine position.  After adequate  general anesthesia, the right chest was prepped and draped in the usual sterile  fashion.  A radial incision was made extending from the nipple out over the 2 oclock position where a palpable firm tender mass had been located. Dissection proceeded carefully down until the mass was reached and with any compression around this area purulent material was expressed out of the nipple.  The incision was elongated over the nipple to expose the entire mass which was carefully dissected out.  The undersurface of the nipple was very carefully dissected away from the  mass.  The mass was then removed and sent to pathology.  The wound was inspected for hemostasis.  The nipple portion of the incision was closed with interrupted 5-0 nylon suture, and one was placed out laterally on the incision.  The midportion of the wound was packed with saline moistened 2 x 2s and a sterile dressing was applied.  The patient was transported to the recovery room in stable condition. Attending Physician:  Inge Rise DD:  02/27/98 TD:  02/27/98 Job: [id number]

## 2010-06-15 NOTE — Op Note (Signed)
[name], [name]                         ACCOUNT NO.:  [id number]   MEDICAL RECORD NO.:  [id number]                   PATIENT TYPE:  AMB   LOCATION:  DSC                                  FACILITY:  [facility]   PHYSICIAN:  Jessy Oto, M.D.                DATE OF BIRTH:  May 23, 1965   DATE OF PROCEDURE:  07/04/2003  DATE OF DISCHARGE:                                 OPERATIVE REPORT   PREOPERATIVE DIAGNOSIS:  Left carpal tunnel syndrome.   POSTOPERATIVE DIAGNOSIS:  Left carpal tunnel syndrome.   OPERATION PERFORMED:  Left open carpal tunnel release.   SURGEON:  Jessy Oto, M.D.   ASSISTANT:  Epimenio Foot, P.A.   ANESTHESIA:  [facility], general orotracheal anesthesia  supplemented with local infiltration with Marcaine 0.5% plain 5 mL.   INDICATIONS FOR PROCEDURE:  The patient is a 44 year old right-hand dominant  female who has undergone previous right carpal tunnel release some nine  months ago.  She did well postoperatively.  She has had persistence of  numbness and paresthesia into the left upper extremity was diagnosed  previously as having a left carpal tunnel syndrome as well, diagnosed as  being moderately severe by electrodiagnostic studies.  She underwent  conservative management including night splints, use of anti-inflammatory  agents, weight reduction, has had increasing numbness and pain at night  time.  She is brought to the operating room to undergo a left open carpal  tunnel release following success of the right open carpal tunnel release.   INTRAOPERATIVE FINDINGS:  She was found to have an area of constricture  involving a thick fibrotic transverse carpal ligament with an area of  palmaris brevis out to the midcarpal level compressing dorsalward against  the volar aspect of the medial nerve.   DESCRIPTION OF PROCEDURE:  After adequate general anesthesia, the patient's  left upper extremity prepped from finger tips to upper  arm with DuraPrep  solution.  Tourniquet about the left upper arm.  The patient had elevation  of the left upper extremity following draping and Esmarch bandage used to  carefully exsanguinate the upper extremity.  Tourniquet inflated to 280  mmHg.   The incision approximately 2 to 3 mm ulnarward to the thenar crease and  curved at the wrist crease towards the ulnar aspect in order to prevent  fibrosis.  Incision through skin and subcutaneous layers, almost area of  about 3 to 3.5 cm in length through the skin and subcutaneous layers  directly down through the palmar fascia and the transverse carpal ligament.  Across the wrist down to the superficial fascial layer overlying the distal  forearm.  The distal fascia then incised in line with the palmaris longus  along its ulnar border.  This used to define the interval then beneath the  fascia where the median nerve was identified just deep to the palmaris  longus tendon.  A Freer elevator then placed beneath the transverse carpal  ligament and a 15 blade scalpel used to incise the transverse carpal  ligament in line with the fourth digit.  Loupe magnification was used during  the portion of the procedure.  Incision carried out beneath the skin to the  midpalmar area using loupe magnification and carefully lifting the skin  edges and using Metzenbaum scissors as well as sharp scissors to divide the  palmar fascia until the superficial transverse palmar arch was identified.  The motor branch to the median nerve identified intact.  Both median nerve  and flexor tendons then elevated off the base of the carpal canal and the  carpus examined.  There was no sign of significant arthrosis or synovitis  affecting the carpal canal.  Irrigation performed.  Tourniquet released.  Bipolar used to cauterize small blood vessels within the fat and subcu areas  present that were bleeding.  No active bleeding noted following this.  Then  the incision was  closed by approximating the skin with interrupted  horizontal mattress sutures of 4-0 nylon.  Adaptic, 4 x 4s, fixed to the  skin with sterile Webril, a well padded volar splint applied with the wrist  in dorsiflexion 20 to 25 degrees.  The patient was then reactivated,  extubated and returned to recovery room in satisfactory condition.  All  instrument and sponge counts were correct.   POSTOPERATIVE CARE:  The patient will keep the dressing dry, elevate above  the heart for a period of one to two days, take Vicodin one or two every  four to six hours as needed for pain.  See  back in the office in two weeks  for follow-up.                                               Jessy Oto, M.D.    Liz Malady  D:  07/04/2003  T:  07/04/2003  Job:  [id number]

## 2010-06-15 NOTE — Discharge Summary (Signed)
[name], ROLLERSON               ACCOUNT NO.:  [id number]   MEDICAL RECORD NO.:  [id number]          PATIENT TYPE:  INP   LOCATION:  5008                         FACILITY:  [facility]   PHYSICIAN:  Jessy Oto, M.D.   DATE OF BIRTH:  [birth year]   DATE OF ADMISSION:  03/04/2006  DATE OF DISCHARGE:  03/09/2006                               DISCHARGE SUMMARY   ADMISSION DIAGNOSIS:  1. Herniated nucleus pulposus left L2-3 above fusion in the L-3 to L-5      level, disk herniation in the foramen and extraforaminal.  2. History of cervical cancer.  3. Hypertension.  4. Gastroesophageal reflux disease.  5. Asthma.  6. History of drug and alcohol dependency.  7. Positive reactive Anti HBC and serology for syphilis.  8. Previous L3-L5 lumbar fusion.  9. Tobacco abuse.  10.Chronic pain syndrome treated by Dr. Hardin Negus of pain management.   DISCHARGE DIAGNOSIS:  1. Herniated nucleus pulposus left L2-3 above fusion in the L-3 to L-5      level, disk herniation in the foramen and extraforaminal.  2. History of cervical cancer.  3. Hypertension.  4. Gastroesophageal reflux disease.  5. Asthma.  6. History of drug and alcohol dependency.  7. Positive reactive Anti HBC and serology for syphilis.  8. Previous L3-L5 lumbar fusion.  9. Tobacco abuse.  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  11.Chronic pain syndrome.   PROCEDURE:  On 03/04/2006 the patient underwent left L2-3 transforaminal  lumbar interbody fusion with extension of posterolateral fusion to the  L2-3 level from L-3 to L-5 using a combination of Vitoss and local bone  graft.  Redo instrumentation exchange of rods only from L-3 to L-5.  Inconclusive of the L2 level, the transforaminal lumbar interbody fusion  performed was with a 9 mm leopard cage with local bone graft.  This was  done by Dr. Louanne Skye, assisted by Phillips Hay [facility] under general  anesthesia.   CONSULTATIONS:  None.   BRIEF HISTORY:  Patient is a 45 year old black female  with previous  anterior lumbar interbody fusion with BAK cages.  She had subsiding of  the fusion to a nonunion and underwent posterolateral fusion from L-2 to  L-5 using DePuy Monarch pedicle screws and rods bone graft and BMP  applied to the intervertebral disk space.  She has been treated  conservatively since that time, however, recently has had increasing  pain with radiation into the left thigh. Studies have indicated disk  herniation at the left side into the foramen affecting the left L-2  nerve root at the L2-3 level.  Conservative treatment including epidural  steroid injections and select nerve root blocks have not been successful  in relieving alleviating her discomfort.  She has had persistent  weakness with left hip flexion and abduction.  It was felt that she  would require surgical intervention and was admitted for the procedure  as stated above.   BRIEF HOSPITAL COURSE:  The patient tolerated the procedure under  general anesthesia without complications.  On the first postoperative  day her Foley catheter was discontinued and her wound was changed  daily  thereafter.  She did have stay sutures in place which had only minimal  drainage around the sutures but no obvious signs of breakdown or  infection.  Neurovascular motor function of the lower extremities was  noted to be within normal limits postoperatively and remained so  throughout the hospital stay.  The patient's Foley catheter continued  for the first two postoperative days and on the third day this was  discontinued.  She was able to void without difficulty.  She was on  clear liquids until flatus and bowel sounds.  At that time her diet was  advanced and she was able to take a regular diet.  The patient utilized  on morphine via PCA pump initially with Percocet for breakthrough pain.  She was eventually returned to her usual dose of Kadian 100 mg b.i.d.  with Percocet for breakthrough pain.  She also received  muscle relaxers.  When the patient was eating better her IV fluids were discontinued.  The  patient was started on physical therapy as well as occupational therapy.  She was very slow to advance.  Initially her brace was not available and  physical therapy was held until her Aspen LSO was available.  Once this  was available she began ambulation with the physical therapist.  Occupational therapy assisted her with ADLs.  She utilized a walker for  ambulation.  She did demonstrate good bed mobility while in the  hospital.  Prior to discharge she was ambulating 125 feet slowly with a  rolling walker and minimal assistance.  She was able to don and doff the  brace at bedside and demonstrated independence with this.  On 03/09/2006  she was afebrile, vital signs were stable.  She was felt stable for  discharge to her home.   PERTINENT LABORATORY VALUES:  CBC on admission within normal limits with  hemoglobin and hematocrit 12.7 and 37.7 respectively.  Hemoglobin  dropped to the lowest value of 8.7 with hematocrit 25.8.  The patient  did not receive blood transfusion during the hospital stay.  She did  receive 300 mL of Cell Saver blood intraoperatively.  Chemistry studies  on admission with elevated glucose at 105.  Chemistry studies throughout  the hospital stay continued to showed normal values with exception of  mildly elevated glucose with highest value being 126.  EKG on admission  normal sinus rhythm, previous EKG comparison showing no significant  change since last tracing confirmed by Dr. Tamala Julian.   PLAN:  The patient will be discharged to her home.  She is given  prescriptions for Percocet 7.5/325 one to two every 4 to 6 hours for  breakthrough pain, Robaxin 750 mg one every 8 hours as needed for spasm.  She was instructed to take her usual home medication and a list of these  were prepared for her and discussed prior to discharge.  She was instructed to return her usual Kadian dose  which was 100 mg b.i.d..  The  patient was instructed to keep her dressing changed daily and keep her  wound dry and clean at all times.  She was instructed in no lifting,  bending, twisting or driving.  She was instructed to be on a low-  carbohydrate diet with plenty of fluids.  The patient  will wear her brace at all times when out of bed.  This may be donned  and doffed at bedside.  She will use a walker for ambulation.  All  questions encouraged and  answered at the time of discharge.  The patient  to follow up with Dr. Louanne Skye in two weeks from the date of surgery.      Epimenio Foot, P.A.      Jessy Oto, M.D.  Electronically Signed    SMV/MEDQ  D:  05/14/2006  T:  05/14/2006  Job:  [id number]

## 2010-06-15 NOTE — Op Note (Signed)
NAMEKEHLY, BOCOCK               ACCOUNT NO.:  [id number]   MEDICAL RECORD NO.:  [id number]          PATIENT TYPE:  INP   LOCATION:  5035                         FACILITY:  [facility]   PHYSICIAN:  Jessy Oto, M.D.   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   POSTOPERATIVE DIAGNOSIS:  1.  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  2.  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   PROCEDURE:  1.  Central laminectomy at L3-4 and L4-5 with left-sided total facetectomy      L3-4 and L4-5.  2.  Decompression of the left L4 and left L5 and left S1 nerve roots.  3.  Posterior lumbar interbody fusion using a combination of local and      allograft Symphony bone graft, internal fixation posteriorly using      Monarch pedicle screws and rods from L3 to L5, two levels. Augmented      with Infuse BMP.  4.  Left-sided placement of bone morphologic protein (Infuse) within BAK      cage left L4-5 level.  5.  Bone graft to the left L4-5 BAK cage.   SURGEON:  Jessy Oto, M.D.   ASSISTANT:  Epimenio Foot, P.A.-C.   ANESTHESIA:  General via orotracheal intubation, Dr. Maryjean Morn.   SPECIMENS:  None.   ESTIMATED BLOOD LOSS:  1300 mL.   DRAINS:  Hemovac x1 left lumbar and Foley to straight drain. The patient  received 1 unit of Cell Saver blood 250 mL and 1 unit of donated autogenous  blood.   HISTORY OF PRESENT ILLNESS:  Patient is a 45 year old female. She has had a  history of lumbar degenerative disk disease; is status post L3-4 and L4-5  anterior lumbar diskectomy and fusion using BAK  cages. Surgery for a BAK  cage was performed in 1995. She has persisted with back pain and discomfort  since then. The pain has been worsening into her left lower extremity. She  has undergone extensive workup; and is in a pain management program. She  underwent follow up studies which demonstrated subsidence of the cages at  both the L3-4 and L4-5 level. She has a transition segment at L5 with  sacralization of this segment. The L4-5 level is the lowest open lumbar disk  level; it is status post BAK fusion anteriorly. The patient is brought to  the operating room to undergo a posterior instrumentation and posterolateral  fusion at L3-4 and L4-5 with the use of bone morphologic protein to enhance  the fusion at the anterior interbody fusion site.   INTRAOPERATIVE FINDINGS:  Unable to enter  the disk site at the L3-4 level.  Therefore, the Infuse could not be put at the L3-4 level. The Infuse;  however, was able to be used at the L5-S1 level in the left BAK cage. The  remaining portions of the Infuse were then placed posterolateral at L3-4 and  L4-5 to enhance the posterolateral fusions that were performed.   DESCRIPTION OF PROCEDURE:  After adequate general anesthesia, the patient an  extremely obese female, weighing nearly 350 pounds, she had placement of  central lines in the right neck, arterial lines. Foley catheter was placed.  Standard preoperative antibiotics. She was then placed into a prone  position. Chest rolls were used. All pressure points were well-padded. The  arms at sides, maintaining 90 degrees at the shoulder to the torso and at  the elbow, to try and prevent any peripheral neuropathy or brachial plexus  injury. The legs with PAS stockings. Standard prep with DuraPrep solution,  draped in the usual manner, and Iodine vidrape was used. Incision ellipsing  the old incision scar at the L3- and at the L5 level and extending up  superiorly to the L2 and inferiorly to the S2.  The patient had the incision  carried through skin and subcutaneous layers, removing the old incisional  scar, centrally, down to the lumbodorsal fascia. The lumbodorsal fascia then  was incised on both sides of the spinous processes extending from the S2-L2.  Cobb was used to elevate the paralumbar muscles bilaterally; care taken not  to enter the previous laminotomy sites, right side, at L3-4 and L4-5. Clamps  then placed on the posterior aspect of the spinous process at L4 and L3; and  intraoperative C-arm fluoro used to ascertain the correct levels; and these  were then marked using a pair of rongeurs for their continued identification  throughout the case as in L3 level and L4 level.   Continued exposure was then obtained out to the sacrum and the L5 level  which was sacralized. This was continued out laterally, bilaterally,  exposing the L4-5 facettes bilaterally. The L5 ala bilaterally as this was  truly a sacralized segment; and then continued upwards identifying the  transverse process at the L4 level and the L3 level bilaterally, preserving  the L2-3 facette capsule bilaterally. The additional exposure was carried  superiorly to about the L2 body, incising the paralumbar muscles to allow  for further lateral exposure for placement of pedicle screws were  appropriate. Bleeders controlled using electrocautery. Viper retractor was  placed. Each of the posterior lateral gutters packed with sponges. This was  done in order to obtain excellent hemostasis and to preserve exposure here.   Using loupe magnification a headlamp then Leksell rongeur was then used to  resect the spinous process of L4 and L3, the Inferior half of the L2 spinous  process, and the superior 1/3 of the spinous process of L5. The central  portions of the lamina at L3 and L4 were then resected and thinned.  The  Kerrisons then used to resect the center portions of the Kerrison to the epidural fat, and the dura  centrally and the spinal canal entered. Care  taken to protect the dural sac and each of the segments. Ligamentum flavum  excised at the L2-3 level, at the L3-4 level, and the L4-5 levels.  Osteotomes were then used to perform medial facetectomy at L3-4 and L4-5  decompressing the lateral recess the best as is possible.  Then the facette  resected removing  the inferior articular process of L3 on the left side and  L4 on the left side, exposing the superior articular process of L5 and of  L4.   Kerrison then used to resect the lateral aspect of the facet at the L5  level. The superior articular process of L5 from the lateral-to-medial, and  also at L4 from lateral-to-medial. Osteotomes used to resect the superior  portion of the articular process opening and decompressing the neural  foramen for the L3 nerve root and the L4 nerve root on the left side for the  3-4 level and 4-5 levels. Note, that at the L4-5 level, the BAK cage was  recognized at the posterior aspect of the disk space. Disk material, in  addition to annular material, had retropulsed into the left side of the  lateral recess at L4-5 bone material was found to be present here as well.  In the spinal canal on the left side of the patient's spinal canal within  the left L4 neural foramen. Osteotomes were then used to resect this bone  after first carefully freeing up the thecal sac off of this area. Using  [place] 4, Epstein curettes as well as micro curettes were necessary.  Hockey stick neural probe could then be passed out the L3 and L4 neural  foramens here. The posterior aspect of BAK cage on the left side at L4-5 was  then easily recognizable.. Hemostasis carefully maintained using Gelfoam as  well as Surgicel and thrombin-soaked Gelfoam. Cottonoids were used as well.   Attention was then turned to placement of pedicle screws at the L3, L4, and  L5 levels; first on the left side using an awl to make an entry point at  the  intersection of the transverse process of L3-8 with the superior articular  process of L3 on the left side. This is an entry point into the pedicle and  a straight pedicle finder was then entered into the pedicle, and observed on  C-arm fluoroscopy to be in correct position and alignment and carefully  guided down the center of the pedicle. A ball tip probe used to test the  patency of the pedicle opening. It appeared to be quite patent without any  evidence of broaching of the cortex. It measured for depth at about 40 mm,  tapped using a 5.5 tap. The transverse process decorticated. Bone graft then  was obtained from the central laminectomy; and facetectomies were combined  with allograft bone graft to make Symphony logs, the patient's platelet-rich  a portion of plasma. This material was then placed over the decorticated  transverse process after tapped the pedicle. This was again tested for patency with a ball-tipped probe, and then the appropriate sized 6.25 screw  x 40 mm was placed on the left side at L3.   Attention turned to the L4 level which between the two BAK cages. Again, an  awl was used to make an initial entry point at the intersection of the  transverse process with the superior articular process of L4; and then a  straight pedicle finder used to probe the pedicle on the left side. This was  measured for depth.  A ball-tipped probe used to probe the channel of the  pedicle and determined its patency without broaching the cortex. The depth  measuring at about 35 mm tapped with a 5.5 tap and 6.25 x 35 mm screw was  placed on the left side at the L4 level after decortication of the  transverse process, and application of Symphony bone graft and local bone  graft material here.   Attention turned to the L5 level on the left side where, again, the ala of  L5 and the facette were carefully evaluated and decorticated. A handheld  straight pedicle finder was then able to be  introduced at the inferior  aspect of the superior articular process of L5 into the L5 pedicle. This was  tested for patency and an appropriate depth of 30 mm chosen. The 7.0 x 30 mm  screw was to be used. Tapping with a 6.25 tap and then placing the screw  appropriately, and converging in the correct degree of lordosis. This  completed placement of the screws on left side at L3, L4, and L5.   Note, that in each decortication was carried out over the ala at the L5  level along the facette and the Symphony bone graft was placed. On the right  side similarly screws were then placed at L3, L4, and L5 as before; again,  using an awl at the intersection the transverse process with the superior  articular process of L3 and also the transverse process of L4; and the  superior articular process of L4 and at the inferior aspect of the superior  articular process of L5 just lateral to this area. Each of these were then  carefully probed with a pedicle probe, a ball-tipped probe used to carefully  ensure patency of each of the pedicles, correct placement of the opening  within the pedicles. They were each tapped, 5.5 taps were used at L3 and L4.  A 6.25 tap at the L5 level, 6.25 screws x 40 mm used on the right side at  L3; a 40 mm screw x 6.25 used on the right side at L4; and a 30 mm x 7.0  screw was used on the right side at L5.   Note, that each instance the transverse process was decorticated and  Symphony bone graft applied prior to placement of the screws at each  segment. Screws were then tested for their soft tissue resistance using the  continuous EMG monitoring and resistance monitoring. At this point, the L3-  L5 levels measured a combined between 28 and 54 which indicated correct  conduction test with no sign of significant broaching of cortices with  correct resistance at each segment. Additionally C-arm appeared to confirm that each of the pedicle screws were in correct position and  alignment. With  this then, each of the fasteners for the pedicle screws were loosened using  the appropriate loosener, and the appropriate size rod at 75 mm in length  that was prevent curved, then placed within the fasteners, and caps then  applied free hand.   Attention was then turned to placement of BMP within the left L4-5, BAK cage  posteriorly. Note, that the cage was easily exposed on the left side,  retraction of the left lateral to the dura was carried out, as well as the  L4 nerve root; and the BAK cage entered using a 1/4-inch osteotome, removing  bone graft from the cage posteriorly in its entirety. Note, that significant  bone graft was found to be present within the cage. It appeared to be  bridging appropriately. There was no evidence of loosening of the cage;  however it was felt that the studies previously had indicated nonunion. With  this and removing the bone graft, BMP material was then placed within the  BAK cage and  additional autogenous bone graft that had been harvested from  the facet was placed; additional BMP and then additional bone graft. This  completed packing of the BAK cage on the left side from posterior over the  posterior half of the cage. At the L3-4 level; however, we were unable to  enter the disk space due to very large epidural veins; and quite a bit of  epidural bleeding encountered. The posterior aspect of the disk space was  not broached; and there did not appear to be a good solid entry point into  the BAK cage area. On radiographic studies, the posterior aspect disk space  appeared to be quite narrowed and inaccessible to the BAK cage. Therefore,  the remaining portions of the BMP that were from the medium Infuse BMP set  were placed over the posterior lateral fusion masses bilaterally, extending  from L3-4 to L4 and L5. Additional bone grafts placed were appropriate.   This completed the procedure, then irrigation was performed. Careful   inspection demonstrated that a hockey-stick neural probe could be passed out  the left L3, L4, and L5 neural foramen without difficulty. Posterior aspect  of these regions appeared to be well decompressed. Bone graft appeared to be  adequate in the posterolateral regions from L3-4 to L4-5. Further irrigation  was performed, and a medium Hemovac drain placed in the depth of the  incision, exiting out of the left lower lumbar region. Paralumbar muscles  carefully approximated with interrupted #1 Vicryl sutures loosely. The  lumbodorsal fascia approximated with interrupted simple sutures of #1 Vicryl  figure-of-eight sutures.   Deep subcu layers approximated with interrupted #1-0 Vicryl sutures; #2  Prolene stay sutures using far-and-near and near-and-far sutures with red  rubber Quentin Cornwall bridges were then placed, a total of 5. Stainless steel  staples were then used to close the incision between these stay sutures. Then 4x4s, ABD pads fixed to the skin with Hypafix tape. Permanent C-arm  images were obtained at the end of the case. Note, that prior to the closure  that the patient's rods were affixed to the fasteners of the screws using a  torque wrench and anti-torque handle; torquing to 80 foot pounds; and  performing compression between L3 and L4 after first attaching the rod to  the L3 level; then fastening the fastener to the rod at the L4 level under  compression; and then between L4 and L5 level also under compression  fastening the L5 connector to the rod at 84 foot pounds. Permanent C-arm  images in AP and lateral planes obtained for documentation purposes. The  patient after having closure of the incision was then returned to a supine  position, reactivated, extubated, and returned to recovery room in a  satisfactory condition   The number [id number] thank you      Jessy Oto, M.D.  Electronically Signed     [name]/MEDQ  D:  04/05/2005  T:  04/07/2005  Job:  [id number]

## 2010-06-15 NOTE — Op Note (Signed)
   [name], [name]                         ACCOUNT NO.:  [id number]   MEDICAL RECORD NO.:  [id number]                   PATIENT TYPE:  AMB   LOCATION:  DSC                                  FACILITY:  [facility]   PHYSICIAN:  Sammuel Hines. Daiva Nakayama, M.D.              DATE OF BIRTH:  07-16-1965   DATE OF PROCEDURE:  05/03/2002  DATE OF DISCHARGE:  05/03/2002                                 OPERATIVE REPORT   PREOPERATIVE DIAGNOSIS:  Recurrent right breast abscess.   POSTOPERATIVE DIAGNOSIS:  Recurrent right breast abscess.   PROCEDURE:  Incision and drainage of right breast abscess.   SURGEON:  Sammuel Hines. Marlou Starks, M.D.   ANESTHESIA:  General endotracheal anesthesia.   PROCEDURE:  After informed consent was obtained, the patient was brought to  the operating room and placed in the supine position on the operating table.  After adequate induction of general anesthesia, the patient's right breast  and chest area were prepped with Betadine and draped in the usual sterile  manner.  The patient has had recurrent drainage and abscesses in the area  just superior to the nipple on the right breast.  An indurated mass could be  palpated in this same area at the start of the case.  A curvilinear incision  was made overlying the area just superior to the right nipple.  This  incision was carried down through the skin and subcutaneous tissue sharply  with a 15 blade knife and the mass was excised from the rest of the soft,  fatty breast tissue also sharply using the 15 blade knife.  Hemostasis was  achieved using the electrocautery.  The wound was then left open and packed  with gauze.  Sterile dressings were applied.  The patient tolerated the  procedure well.  At the end of the case, all needle, sponge, and instrument  counts were correct.  The patient was awakened and taken to the recovery  room in stable condition.                                               Sammuel Hines. Daiva Nakayama, M.D.    PST/MEDQ   D:  05/05/2002  T:  05/05/2002  Job:  [id number]

## 2010-06-15 NOTE — Op Note (Signed)
   [name], [name]                         ACCOUNT NO.:  [id number]   MEDICAL RECORD NO.:  [id number]                   PATIENT TYPE:  AMB   LOCATION:  [place]                                  FACILITY:  [facility]   PHYSICIAN:  Sammuel Hines. Daiva Nakayama, M.D.              DATE OF BIRTH:  [birth year]   DATE OF PROCEDURE:  11/30/2001  DATE OF DISCHARGE:                                 OPERATIVE REPORT   PREOPERATIVE DIAGNOSES:  Mass beneath the nipple areolar complex in the left  breast.   POSTOPERATIVE DIAGNOSES:  Mass beneath the nipple areolar complex in the  left breast.   OPERATION PERFORMED:  Excisional biopsy of mass of the left breast.   SURGEON:  Sammuel Hines. Marlou Starks, M.D.   ANESTHESIA:  General via LMA.   DESCRIPTION OF PROCEDURE:  After informed consent was obtained, the patient  was brought to the operating room and placed in supine position on the  operating table.  After adequate induction of general anesthesia, a  curvilinear incision was made at the edge of the areola just on the lateral  edge in the left breast.  This incision was carried down through the skin  and subcutaneous tissue sharply with the Bovie electrocautery.  The mass in  question was palpable underneath the nipple areolar complex and the area was  incised sharply with the Bovie electrocautery taking care not to puncture  the skin.  The mass seemed to be most consistent with a sebaceous cyst.  Once the mass was completely excised circumferentially, the mass was  removed.  The cavity was then irrigated with copious amounts of saline.  Hemostasis was achieved using the Bovie electrocautery.  The skin incision  was then closed with a running 4-0 Monocryl subcuticular stitch.  Benzoin,  Steri-Strips and sterile dressings were applied.  The patient tolerated the  procedure well.  At the end of the case all sponge, needle and instrument  counts were correct.  The patient was awakened and taken to the recovery  room in  stable condition.                                                   Sammuel Hines. Daiva Nakayama, M.D.    PST/MEDQ  D:  12/02/2001  T:  12/02/2001  Job:  [id number]

## 2010-06-15 NOTE — Op Note (Signed)
[facility]  Patient:    [name], [name] Visit Number: [id number] MRN: [id number]          Service Type: Attending:  Autumn Messing, M.D. Dictated by:   Autumn Messing, M.D. Proc. Date: 12/29/00                             Operative Report  PREOPERATIVE DIAGNOSIS:  Left breast mass.  POSTOPERATIVE DIAGNOSIS:  Left breast mass.  OPERATION PERFORMED:  Excision of left breast mass.  SURGEON:  Autumn Messing, M.D.  ANESTHESIA:  General endotracheal.  DESCRIPTION OF PROCEDURE:  After informed consent was obtained, the patient was brought to the operating room and placed in the supine position on the operating table.  After adequate induction of general anesthesia, the patients left breast was prepped with Betadine and draped in the usual sterile manner.  A small incision was made along the inferior edge of the areola on the left breast.  The incision was carried down through the skin and subcutaneous tissues with the Bovie electrocautery.  The mass was identified and was excised circumferentially taking care not to get into the mass. ____________ dissection was done using the Bovie electrocautery and once the mass was freed from the rest of the breast tissue, the wound was then irrigated with copious amounts of saline.  The wound was examined and found to be hemostatic.  The incision was then closed with a running 4-0 Monocryl subcuticular stitch.  Benzoin and Steri-Strips were applied.  The patient tolerated the procedure well.  At the end of the case sponge, needle and instrument counts were correct.  The patient was awakened and taken to the recovery room in stable condition. Dictated by:   Autumn Messing, M.D. Attending:  Autumn Messing, M.D. DD:  12/31/00 TD:  12/31/00 Job: [id number]

## 2010-06-15 NOTE — Discharge Summary (Signed)
NAME:  [name], [name] NO.:  [id number]   MEDICAL RECORD NO.:  [id number]                   PATIENT TYPE:  IPS   LOCATION:  0503                                 FACILITY:  [facility]   PHYSICIAN:  Rulon Eisenmenger, M.D.              DATE OF BIRTH:  08-09-1965   DATE OF ADMISSION:  10/20/2001  DATE OF DISCHARGE:  10/24/2001                                 DISCHARGE SUMMARY   IDENTIFYING DATA:  This is a 45 year old African-American female, divorced,  voluntarily admitted with a history of polysubstance abuse, presenting with  homicidal ideation in her primary care physician's office.  The patient felt  violent urges toward her son and felt that she had been failing her family,  dealing with sickle cell disease, and not getting adequate support. She was  daily using alcohol, described poor sleep, agitation, frustration, and  fleeting homicidal thoughts.  This is the first psychiatric admission and  first psychiatric treatment.   MEDICATIONS:  Depo-Provera every three months.   PHYSICAL EXAMINATION:  GENERAL: Essentially within normal limits; performed  in the emergency room.  NEUROLOGIC: Nonfocal.   LABORATORY DATA:  Urine drug screen: Positive for amphetamines and cocaine.  SGPT was mildly elevated at 45.   MENTAL STATUS EXAM:  Large built, obese, fully alert female, cooperative,  pleasant.  Speech: Within normal limits.  Mood: Irritable and frustrated.  Thought process: Goal directed.  Thought content: Negative for psychotic  symptoms.  The patient reported homicidal thoughts which were distressing.  Cognitive: Intact.  Judgment and insight: Poor.   ADMISSION DIAGNOSES:   AXIS I:  1. Alcohol abuse.  2. Polysubstance abuse.  3. Posttraumatic stress disorder.  4. 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   AXIS II:  None.   AXIS III:  1. Hypertension.  2. Asthma.  3. Tobacco abuse.  4. Chronic back pain.  5. Slightly  elevated liver function tests.   AXIS IV:  Moderate problems with primary support group and economic  stressors.   AXIS V:  [id number]   HOSPITAL COURSE:  The patient was admitted, ordered routine p.r.n.  medications, underwent further monitoring, and was encouraged to participate  in individual, group, and milieu therapy.  The patient, after admission,  reported heavy alcohol use and crack cocaine use on weekends, reported  fleeting suicidal thoughts, no longer having homicidal thoughts.  She had a  history of a positive response to Prozac.  She was placed on Flonase,  restarted on Prozac, and monitored for a safe detoxification.  The patient  participated in individual and group therapy, focusing on substance abuse  treatment.  The patient reported improvement in insight regarding severity  of her addiction and need for aftercare treatment.  She appeared highly  motivated to remain sober, tolerated medication changes well.   CONDITION ON DISCHARGE:  Condition on discharge was markedly improved.  Mood  was euthymic.  Affect: Much brighter.  Thought processes: Goal directed.  Thought content: Negative for dangerous ideation or psychotic symptoms.  The  patient reported high motivation to be compliant with the aftercare plan,  remain sober, and attend AA and NA meetings.   DISCHARGE MEDICATIONS:  1. OxyContin SR 20 mg b.i.d.  2. Ventolin 90 mcg inhaler two puffs q.6.h. p.r.n. shortness of breath.  3. Lipitor 10 mg q.a.m.  4. Protonix 40 mg q.a.m.  5. Singulair 10 mg q.a.m.  6. Arthrotec 75 mg b.i.d.  7. Prozac 20 mg q.a.m.  8. Flonase 0.05% nasal spray one b.i.d.   FOLLOW UP:  The patient was to follow up with Dr. Toney Reil on October 8 at  3:00 p.m. and [facility] on October 1.   DISCHARGE DIAGNOSES:   AXIS I:  1. Alcohol abuse.  2. Polysubstance abuse.  3. Posttraumatic stress disorder.  4. Possible impulse control disorder, not otherwise  specified.   AXIS II:  None.   AXIS III:  1. Hypertension.  2. Asthma.  3. Tobacco abuse.  4. Chronic back pain.  5. Slightly elevated liver function tests.   AXIS IV:  Moderate problems with primary support group and economic  stressors.    AXIS V:  Global assessment of functioning on discharge was 95.                                                Rulon Eisenmenger, M.D.    JEM/MEDQ  D:  11/23/2001  T:  11/23/2001  Job:  [id number]

## 2010-08-23 ENCOUNTER — Encounter: Payer: Self-pay | Admitting: Family Medicine

## 2010-10-25 LAB — BASIC METABOLIC PANEL
BUN: 7
CO2: 27
Calcium: 9.1
Chloride: 102
Creatinine, Ser: 0.92
GFR calc Af Amer: >60
GFR calc non Af Amer: >60
Glucose, Bld: 111 — ABNORMAL HIGH
Potassium: 4
Sodium: 138

## 2010-10-25 LAB — POCT HEMOGLOBIN-HEMACUE: Hemoglobin: 15.9 — ABNORMAL HIGH

## 2011-03-15 ENCOUNTER — Ambulatory Visit (HOSPITAL_COMMUNITY)
Admission: RE | Admit: 2011-03-15 | Discharge: 2011-03-15 | Disposition: A | Discharge status: Home / Self Care | Payer: Medicare Other | Source: Ambulatory Visit | Attending: Family Medicine | Admitting: Family Medicine

## 2011-03-15 ENCOUNTER — Ambulatory Visit (INDEPENDENT_AMBULATORY_CARE_PROVIDER_SITE_OTHER): Payer: Medicare Other | Admitting: Family Medicine

## 2011-03-15 ENCOUNTER — Encounter: Payer: Self-pay | Admitting: Family Medicine

## 2011-03-15 ENCOUNTER — Other Ambulatory Visit: Payer: Self-pay

## 2011-03-15 VITALS — BP 168/100 | HR 114 | Temp 98.4°F | Resp 18 | Ht 69.0 in | Wt 321.0 lb

## 2011-03-15 DIAGNOSIS — K219 Gastro-esophageal reflux disease without esophagitis: Secondary | ICD-10-CM

## 2011-03-15 DIAGNOSIS — E669 Obesity, unspecified: Secondary | ICD-10-CM

## 2011-03-15 DIAGNOSIS — R0602 Shortness of breath: Secondary | ICD-10-CM

## 2011-03-15 DIAGNOSIS — F329 Major depressive disorder, single episode, unspecified: Secondary | ICD-10-CM | POA: Diagnosis not present

## 2011-03-15 DIAGNOSIS — F3289 Other specified depressive episodes: Secondary | ICD-10-CM

## 2011-03-15 DIAGNOSIS — L989 Disorder of the skin and subcutaneous tissue, unspecified: Secondary | ICD-10-CM | POA: Insufficient documentation

## 2011-03-15 DIAGNOSIS — I10 Essential (primary) hypertension: Secondary | ICD-10-CM | POA: Diagnosis not present

## 2011-03-15 DIAGNOSIS — N926 Irregular menstruation, unspecified: Secondary | ICD-10-CM

## 2011-03-15 DIAGNOSIS — E785 Hyperlipidemia, unspecified: Secondary | ICD-10-CM | POA: Diagnosis not present

## 2011-03-15 DIAGNOSIS — L659 Nonscarring hair loss, unspecified: Secondary | ICD-10-CM

## 2011-03-15 DIAGNOSIS — N949 Unspecified condition associated with female genital organs and menstrual cycle: Secondary | ICD-10-CM

## 2011-03-15 DIAGNOSIS — F39 Unspecified mood [affective] disorder: Secondary | ICD-10-CM

## 2011-03-15 DIAGNOSIS — N925 Other specified irregular menstruation: Secondary | ICD-10-CM

## 2011-03-15 DIAGNOSIS — G43909 Migraine, unspecified, not intractable, without status migrainosus: Secondary | ICD-10-CM | POA: Diagnosis not present

## 2011-03-15 DIAGNOSIS — J449 Chronic obstructive pulmonary disease, unspecified: Secondary | ICD-10-CM

## 2011-03-15 DIAGNOSIS — J4489 Other specified chronic obstructive pulmonary disease: Secondary | ICD-10-CM

## 2011-03-15 DIAGNOSIS — N939 Abnormal uterine and vaginal bleeding, unspecified: Secondary | ICD-10-CM

## 2011-03-15 DIAGNOSIS — G4733 Obstructive sleep apnea (adult) (pediatric): Secondary | ICD-10-CM

## 2011-03-15 DIAGNOSIS — F17200 Nicotine dependence, unspecified, uncomplicated: Secondary | ICD-10-CM

## 2011-03-15 DIAGNOSIS — N6019 Diffuse cystic mastopathy of unspecified breast: Secondary | ICD-10-CM

## 2011-03-15 DIAGNOSIS — D509 Iron deficiency anemia, unspecified: Secondary | ICD-10-CM

## 2011-03-15 DIAGNOSIS — N938 Other specified abnormal uterine and vaginal bleeding: Secondary | ICD-10-CM

## 2011-03-15 DIAGNOSIS — G834 Cauda equina syndrome: Secondary | ICD-10-CM | POA: Diagnosis not present

## 2011-03-15 DIAGNOSIS — J069 Acute upper respiratory infection, unspecified: Secondary | ICD-10-CM | POA: Insufficient documentation

## 2011-03-15 HISTORY — DX: Disorder of the skin and subcutaneous tissue, unspecified: L98.9

## 2011-03-15 LAB — BASIC METABOLIC PANEL
BUN: 7 mg/dL (ref 6–23)
Calcium: 9.2 mg/dL (ref 8.4–10.5)
Creat: 0.88 mg/dL (ref 0.50–1.10)

## 2011-03-15 LAB — CBC WITH DIFFERENTIAL/PLATELET
Basophils Absolute: 0.1 10*3/uL (ref 0.0–0.1)
Basophils Relative: 1 % (ref 0–1)
Eosinophils Absolute: 0.2 10*3/uL (ref 0.0–0.7)
Eosinophils Relative: 2 % (ref 0–5)
HCT: 47 % — ABNORMAL HIGH (ref 36.0–46.0)
MCHC: 36 g/dL (ref 30.0–36.0)
MCV: 79.9 fL (ref 78.0–100.0)
Monocytes Absolute: 1.2 10*3/uL — ABNORMAL HIGH (ref 0.1–1.0)
RDW: 13.9 % (ref 11.5–15.5)

## 2011-03-15 MED ORDER — BENAZEPRIL-HYDROCHLOROTHIAZIDE 20-12.5 MG PO TABS: 1.0000 | ORAL_TABLET | Freq: Every day | ORAL | Status: DC

## 2011-03-15 MED ORDER — METOPROLOL SUCCINATE ER 50 MG PO TB24: 50.0000 mg | ORAL_TABLET | Freq: Every day | ORAL | Status: DC

## 2011-03-15 MED ORDER — VENLAFAXINE HCL 75 MG PO TABS: 150.0000 mg | ORAL_TABLET | Freq: Every day | ORAL | Status: DC

## 2011-03-15 MED ORDER — ARIPIPRAZOLE 5 MG PO TABS: 10.0000 mg | ORAL_TABLET | Freq: Every day | ORAL | Status: DC

## 2011-03-15 MED ORDER — ASPIRIN 81 MG PO TABS: 81.0000 mg | ORAL_TABLET | Freq: Every day | ORAL | Status: DC

## 2011-03-15 NOTE — Patient Instructions (Signed)
It was a pleasure to meet you today, but I am sorry you weren't feeling well. I look forward to seeing you back soon. Hopefully restarting your medications will make you feel better.   To review: 1. We refilled your 2 blood pressure medicines as well as your depression medication. Please keep taking your reflux medicine.  2. We are going to get some labs and a CXR. If there is anything concerning, I will call you by Monday or sooner if need be.  3. If you develop chest pain or worsening shortness of breath, you should be sooner.  4. I would like to see you back in 1-2 weeks.  5. Remember if you have any thoughts of hurting yourself or anyone else, call 911 immediately.   Thanks, Dr. Yong Channel

## 2011-03-15 NOTE — Assessment & Plan Note (Signed)
Patient aware of negative consequences. Given # of other issues, will readdress with encouragement to quit once depression better controlled and patient  Improved from current illness.

## 2011-03-15 NOTE — Progress Notes (Addendum)
Subjective:    Patient ID: [name], female    DOB: 01-29-1966, 46 y.o.   MRN: [id number]  HPI [name] is a 37 F with a history of depression with mood disorder, COPD, OSA, HTN presenting with cough, congestion x 3 weeks.   Patient has not been seen since May of 2012. She says in July she became very depressed. She says that she lost her mom approximately 2 years ago on February 18 and she has not been coping with this well. She started drinking more and drinks about a gallon of liquor every other weekend. She started using marijuana. Her smoking is increased to 1.72f packs per day. She says that when she was depressed in July 2012 she stopped taking all her medicines and has not taken any since that time. The only medicine she has been taking his Prilosec over-the-counter. Patient currently denies any homicidal or suicidal ideation. Says that she has been admitted to behavioral health at least twice previously. She says she knows when to call  for help.PHQ 9 of 24 with 0 for thoughts of suicide/homicide. Wants to restart meds for blood pressure and depression.   The patient primarily presents today due to a cold symptoms for 3 weeks. She says that she started coughing 3 weeks ago and then also noted congestion and rhinorrhea. She's been using Alka-Seltzer cold and Mucinex as well as an allergy release medicine. She denies fevers says she has felt chilled occasionally. She has some sore throat but mainly this is due to her cough. She has no ear pain she has no nausea or vomiting. She does complain of increased malaise and decreased strength. She says that no one has been sick around her. She says she was tolerating her symptoms until Tuesday when she started to notice increasing shortness of breath when she woke up in the morning. She says her cough increased in intensity. She felt a little tight in her throat with her coughing. She says her cough has made her abdomen sore. She presented today  because she was worried that things are not getting better.   Review of Systems negative except as noted in HPI     Objective:   Physical Exam  Vitals reviewed. Constitutional: She is oriented to person, place, and time. She appears well-developed and well-nourished. No distress.       Anxious appearing, somewhat shaky.   HENT:  Head: Normocephalic and atraumatic.  Mouth/Throat: Oropharynx is clear and moist. No oropharyngeal exudate.  Eyes: Pupils are equal, round, and reactive to light.  Neck: Normal range of motion. Neck supple.  Cardiovascular: Regular rhythm.  Exam reveals no gallop and no friction rub.   No murmur heard.      Tachycardic.   Pulmonary/Chest: Effort normal and breath sounds normal. No respiratory distress. She has no wheezes. She has no rales. She exhibits no tenderness.       Decreased breath sounds due to obesity.   Abdominal: Soft. Bowel sounds are normal. She exhibits no distension. There is no tenderness. There is no rebound.  Genitourinary:       At top of gluteal crease a 79mm by 2cm friable lesion with an almost wart like appearance.   Musculoskeletal: Normal range of motion. She exhibits edema (1+ pitting edema. ). She exhibits no tenderness.  Neurological: She is alert and oriented to person, place, and time.  Skin: Skin is warm. She is diaphoretic (moisture on cheeks. Patient appears anxious but otherwise well  appearing. ). No erythema.      Assessment & Plan:

## 2011-03-15 NOTE — Assessment & Plan Note (Addendum)
Well controlled approximately 1 year ago at LDL 101. Patient with HTN as only risk factor so goal of <160. No medication required.

## 2011-03-15 NOTE — Assessment & Plan Note (Signed)
Located in gluteal crease 1 cm below top of gluteal fold. Appears almost wart like. Causing patient some pain. Will reevaluate at next visit. Possibly papilloma related. Does not appear infected. Also in position could be pilonidal cyst but likely not given location and non cyst like appearance.

## 2011-03-15 NOTE — Assessment & Plan Note (Signed)
Patient unprepared to quit smoking at this time. Says only occasionally uses albuterol and says has not had much wheezing or tightness with current illness so she has not used albuterol recently.

## 2011-03-15 NOTE — Assessment & Plan Note (Signed)
Encouraged patient to continue PPI OTC. Appears well controlled. If cough x3 week not improved as illness resolves, would consider increasing dosage.

## 2011-03-15 NOTE — Assessment & Plan Note (Addendum)
Will restart metoprolol, benazepril, HCTZ. Will obtain BMET to make sure patient without kidney damage while off of medication. Repeat blood pressure 168/100 manual with large cuff. Follow up in 2 weeks.

## 2011-03-15 NOTE — Assessment & Plan Note (Signed)
Will restart abilify at 10mg  per day. Will also restart venlafaxine for depression. Need to have patient plugged back in to mood clinic.

## 2011-03-15 NOTE — Assessment & Plan Note (Addendum)
Likely URI (advised mucinex, hydration, and Nettie pot. Did not think flu given afebrile), but given orthopnea, will obtain BNP and CXR. Also given worsening of symptoms after 3 weeks, possible PNA. Patient tachycardic but not tachypneic and previously on beta blocker. EKG with sinus tach, but poor tracing so difficult to interpret otherwise. No obvious abnormalities.

## 2011-03-15 NOTE — Assessment & Plan Note (Signed)
Restarted venlafaxine and abilify per patient request. PHq 9 24 with 0 for thoughts of SI or HI. Patient able to contract for safety. Patient would benefit from being plugged back into mood disorder clinic. Also, possibly would benefit from psychiatric evaluation to determine if alternative regimen may be better as history of aggression and hypomanic symptoms.

## 2011-03-21 ENCOUNTER — Encounter: Payer: Self-pay | Admitting: Family Medicine

## 2011-03-29 ENCOUNTER — Ambulatory Visit (INDEPENDENT_AMBULATORY_CARE_PROVIDER_SITE_OTHER): Payer: Medicare Other | Admitting: Family Medicine

## 2011-03-29 VITALS — BP 140/90 | HR 84 | Temp 98.2°F | Ht 69.0 in | Wt 321.0 lb

## 2011-03-29 DIAGNOSIS — F39 Unspecified mood [affective] disorder: Secondary | ICD-10-CM

## 2011-03-29 DIAGNOSIS — J309 Allergic rhinitis, unspecified: Secondary | ICD-10-CM | POA: Diagnosis not present

## 2011-03-29 DIAGNOSIS — L989 Disorder of the skin and subcutaneous tissue, unspecified: Secondary | ICD-10-CM | POA: Diagnosis not present

## 2011-03-29 DIAGNOSIS — I10 Essential (primary) hypertension: Secondary | ICD-10-CM | POA: Diagnosis not present

## 2011-03-29 DIAGNOSIS — J069 Acute upper respiratory infection, unspecified: Secondary | ICD-10-CM

## 2011-03-29 DIAGNOSIS — F17200 Nicotine dependence, unspecified, uncomplicated: Secondary | ICD-10-CM | POA: Diagnosis not present

## 2011-03-29 LAB — BASIC METABOLIC PANEL
BUN: 9 mg/dL (ref 6–23)
Potassium: 3.9 mEq/L (ref 3.5–5.3)
Sodium: 141 mEq/L (ref 135–145)

## 2011-03-29 LAB — LDL CHOLESTEROL, DIRECT: Direct LDL: 141 mg/dL — ABNORMAL HIGH

## 2011-03-29 MED ORDER — MONTELUKAST SODIUM 10 MG PO TABS: 10.0000 mg | ORAL_TABLET | Freq: Every day | ORAL | Status: DC

## 2011-03-29 MED ORDER — CETIRIZINE HCL 10 MG PO CAPS: 1.0000 | ORAL_CAPSULE | Freq: Every day | ORAL | Status: DC

## 2011-03-29 NOTE — Patient Instructions (Signed)
Dear Ms. [name],   It was great to see you today. Thank you for coming to clinic. Please read below regarding the issues that we discussed.   1. I am glad your spirits are doing so much better back on your medicine. I want you to get back in to see Dr. Gwenlyn Saran. You can schedule an appointment with her by calling her directly at [phone]. 2. I am glad your cough and congestion/cold have gotten better. Since it seems you are mainly dealing with allergies now, I want you to restart your singulair and cetirizine which I have refilled today.  3. I want you to go to the front desk and ask for an appointment for the women's procedure clinic either on March 14th or 21st. If these dates aren't available, just ask for the next available appointment.  4. For your blood pressure, I am glad it is doing so much better. We are going to get some labs today to make sure that your electrolytes are ok on the medication. I will also check your cholesterol.  5. Finally, I want you to ask for an appointment in Dr. Graylin Shiver clinic to discuss smoking cessation. Set as a goal going to 1 pack per day down from 1.5 packs per day.   Please follow up in clinic in 6 weeks . Please call earlier if you have any questions or concerns.   Sincerely,  Dr. Garret Reddish

## 2011-03-29 NOTE — Assessment & Plan Note (Signed)
Will refill Singulair. URI symptoms have resolved and seem to be primarily allergies.

## 2011-03-29 NOTE — Progress Notes (Signed)
Subjective:    Patient ID: [name], female    DOB: Jun 07, 1965, 46 y.o.   MRN: [id number]  HPI [name] is a 58 F with a history of depression with mood disorder, COPD, OSA, HTN presenting for follow up after being seen 2 weeks ago for URI symptoms.    1. URI f/u-says symptoms are now much improved. She never went for CXR because she thought she needed a form. BNP, CBC, BMET were reviewed with patient from last visit which showed a normal white count and BNP not elevated. THe only lingering thing seems to be some coughing and runny nose which she says is similar to her allergies from the past. [facility] requests to restart her allergy medicines.   2. [place] her mom approximately 2 years ago on February 18 and this has been a heavy weight on patient. Dad died 4 years ago within the last month. She has been able to cope better since being back on medications. She went out to eat with her sisters on the 2 year anniversary of mother dying and she says that helped.  Dr. Tawni Pummel saw Dr. Gwenlyn Saran and would like to reestablish with her.  Compliance with meds-taking abilify and venlafaxine as prescribed when previously had been off for almost 6 months.  HI/SI-none confirmed and patient able to contract for safety once again.  PHQ 9 -24 previously now 19 with 0 for question 9  3. Gluteal lesion-present for 4 months, steadily growing, no drainage, some bleeding over the last week.   4. HTN-much improved today/at goal with patient back on BP medicines. Patient denies CP/SOB (other than with cold)/HA/blurry vision/orthopnea/PND. Patient has a history of OSA on cpap but no longer uses-could contribute to high BPs when on medicine. No side effects of medicines. WIlling to have BMET drawn today.   5. Tobacco abuse-1.5 ppd. Patient willing to try to go to 1 ppd. SHe is also willing to meet with Dr. Valentina Lucks as she has done in the past. Can't talk about specific triggers. Does say she smokes at  least 3 cigarettes as soon as she wakes up from sleep.   Review of Systemsnegative except as noted in HPI     Objective:   Physical Exam BP 140/90  Pulse 84  Temp(Src) 98.2 F (36.8 C) (Oral)  Ht 5\' 9"  (1.753 m)  Wt 321 lb (145.605 kg)  BMI 47.40 kg/m2 Constitutional: morbidly obeses. She appears well-developed and well-nourished. No distress. Not anxious appearing/shaky as she was in the last visit.  Head: Normocephalic and atraumatic.  Mouth/Throat: Oropharynx is clear and moist. No oropharyngeal exudate.  Eyes: Pupils are equal, round, and reactive to light.  Neck: Normal range of motion. Neck supple.  Cardiovascular: Regular rhythm.  Exam reveals no gallop and no friction rub.  No murmur heard.  Pulmonary/Chest: Effort normal and breath sounds normal. No respiratory distress. She has no wheezes. She has no rales. She exhibits no tenderness.       Decreased breath sounds due to obesity.   Abdominal: Soft. Bowel sounds are normal. She exhibits no distension. There is no tenderness. There is no rebound.  Genitourinary:       At top of gluteal crease a 77mm by 2cm friable lesion with a wart like appearance.  Unchanged from previous exam.  Musculoskeletal: Normal range of motion. Trace edema noted. She exhibits no tenderness.  Neurological: She is alert and oriented to person, place, and time. moves extremities x4.  Skin: Skin is  warm and dry.     Assessment & Plan:

## 2011-03-30 ENCOUNTER — Encounter: Payer: Self-pay | Admitting: Family Medicine

## 2011-03-30 NOTE — Assessment & Plan Note (Signed)
BMET wnl as restarted ace inhibitor and hctz. At goal today. No red flags. COntinue current therapy

## 2011-03-30 NOTE — Assessment & Plan Note (Addendum)
Asked patient to schedule appointment with womens clinic/procedures clinic. Lesion appears papilloma related. Had Dr. Doreene Nest look at lesion and thought this could be removed in clinic. WIll discuss with Dr. Nori Riis in women's clinic after evaluation with possible removal (will send for path given fast growing size and no history of genital warts).   Tylenol or NSAIDS for pain control.

## 2011-03-30 NOTE — Assessment & Plan Note (Signed)
PHQ9 improved to 19 from 24 with patient back on medications. She would like to reestablish care with Dr. Gwenlyn Saran as she states these meetings were helpful for her.

## 2011-03-30 NOTE — Assessment & Plan Note (Addendum)
Patient states she is ready to quit. Has met with Dr. Valentina Lucks before and would like to meet with him again to discuss options for quitting. Patient will attempt to cut down to 1 ppd from 1.5 ppd currently in anticipation of meeting with Dr. Valentina Lucks.

## 2011-03-30 NOTE — Assessment & Plan Note (Signed)
Concern for possible PNA or CHF at last visit. Labs unremarkable. Patient improved after 2 weeks and now symptoms seem to be baseline allergy symptoms. Patient still has slight SOB but this seems to be related to URI on top of chronic deconditioning from smoking.

## 2011-04-07 ENCOUNTER — Other Ambulatory Visit: Payer: Self-pay | Admitting: Family Medicine

## 2011-04-07 DIAGNOSIS — L989 Disorder of the skin and subcutaneous tissue, unspecified: Secondary | ICD-10-CM

## 2011-04-07 DIAGNOSIS — E785 Hyperlipidemia, unspecified: Secondary | ICD-10-CM

## 2011-04-07 MED ORDER — SIMVASTATIN 10 MG PO TABS: 10.0000 mg | ORAL_TABLET | Freq: Every day | ORAL | Status: DC

## 2011-04-07 NOTE — Progress Notes (Signed)
Updated patient that I would be unable to remove gluteal cleft wart in clinic. Patient states understanding. Patient agreeable to surgery referral (placed at this time).    Also informed patient of high cholesterol of 141 (patient with 2 risk factors and goal of <130). She does not want to try therapeutic lifestyle changes. She would rather be started on a medication so I have sent in an rx for simvastatin.

## 2011-04-08 ENCOUNTER — Encounter (INDEPENDENT_AMBULATORY_CARE_PROVIDER_SITE_OTHER): Payer: Self-pay | Admitting: Surgery

## 2011-04-09 ENCOUNTER — Encounter: Payer: Self-pay | Admitting: Family Medicine

## 2011-04-09 ENCOUNTER — Ambulatory Visit (INDEPENDENT_AMBULATORY_CARE_PROVIDER_SITE_OTHER): Payer: Medicare Other | Admitting: Surgery

## 2011-04-09 ENCOUNTER — Encounter (INDEPENDENT_AMBULATORY_CARE_PROVIDER_SITE_OTHER): Payer: Self-pay | Admitting: Surgery

## 2011-04-09 VITALS — BP 188/118 | HR 80 | Temp 96.8°F | Resp 20 | Ht 70.0 in | Wt 327.2 lb

## 2011-04-09 DIAGNOSIS — A630 Anogenital (venereal) warts: Secondary | ICD-10-CM

## 2011-04-09 DIAGNOSIS — Z8619 Personal history of other infectious and parasitic diseases: Secondary | ICD-10-CM

## 2011-04-09 HISTORY — DX: Personal history of other infectious and parasitic diseases: Z86.19

## 2011-04-09 HISTORY — DX: Anogenital (venereal) warts: A63.0

## 2011-04-09 NOTE — Progress Notes (Signed)
Patient ID: [name], female   DOB: January 03, 1966, 46 y.o.   MRN: [id number]  Chief Complaint  Patient presents with  . Other    new pt- eval wart    HPI [name] is a 46 y.o. female.  Referred by Dr. Garret Reddish - [facility] HPI This is a 46 year old female with a number of medical problems who presents with a 5 month history of a lump growing at the area near her tailbone. She has had previous problems with "boils" and is thought that this represented another boil. Usually she treats these skin infections with "fat back" or holding a potato over the area. These were unsuccessful and the skin lesion has gotten much larger she went to her primary care physician for evaluation. He felt that this represented a wart-like lesion and is now referring the patient for surgical evaluation. She states that this area is tender but denies any bleeding or infection. Past Medical History  Diagnosis Date  . Hyperlipidemia   . Sleep apnea   . Migraine headache   . Hypertension   . COPD (chronic obstructive pulmonary disease)   . Fibrocystic breast disease   . GERD (gastroesophageal reflux disease)   . Alopecia     Past Surgical History  Procedure Date  . Back surgery     x5  . Breast cyst excision     bilateral breast  . Neck surgery     x3    Family History  Problem Relation Age of Onset  . Stroke Father   . Cancer Maternal Aunt     breast    Social History History  Substance Use Topics  . Smoking status: Current Everyday Smoker -- 1.5 packs/day for 30 years    Types: Cigarettes  . Smokeless tobacco: Not on file  . Alcohol Use: Yes    Allergies  Allergen Reactions  . Levofloxacin   . Methadone Hcl     REACTION: "blacked out" in 1990s    Current Outpatient Prescriptions  Medication Sig Dispense Refill  . ARIPiprazole (ABILIFY) 5 MG tablet Take 2 tablets (10 mg total) by mouth daily.  30 tablet  0  . aspirin 81 MG tablet Take 1 tablet (81 mg total) by  mouth daily.  30 tablet  11  . benazepril-hydrochlorthiazide (LOTENSIN HCT) 20-12.5 MG per tablet Take 1 tablet by mouth daily.  30 tablet  11  . Cetirizine HCl 10 MG CAPS Take 1 capsule (10 mg total) by mouth daily.  30 capsule  11  . esomeprazole (NEXIUM) 20 MG capsule Take 1 capsule (20 mg total) by mouth 2 (two) times daily.  60 capsule  6  . metoprolol succinate (TOPROL-XL) 50 MG 24 hr tablet Take 1 tablet (50 mg total) by mouth daily.  30 tablet  11  . montelukast (SINGULAIR) 10 MG tablet Take 1 tablet (10 mg total) by mouth daily.  30 tablet  11  . simvastatin (ZOCOR) 10 MG tablet Take 1 tablet (10 mg total) by mouth at bedtime.  90 tablet  1  . venlafaxine (EFFEXOR) 75 MG tablet Take 2 tablets (150 mg total) by mouth daily.  30 tablet  6    Review of Systems Review of Systems  Constitutional: Negative for fever, chills and unexpected weight change.  HENT: Negative for hearing loss, congestion, sore throat, trouble swallowing and voice change.   Eyes: Negative for visual disturbance.  Respiratory: Positive for cough. Negative for wheezing.   Cardiovascular: Negative  for chest pain, palpitations and leg swelling.  Gastrointestinal: Negative for nausea, vomiting, abdominal pain, diarrhea, constipation, blood in stool, abdominal distention and anal bleeding.  Genitourinary: Negative for hematuria, vaginal bleeding and difficulty urinating.  Musculoskeletal: Negative for arthralgias.  Skin: Negative for rash and wound.  Neurological: Positive for headaches. Negative for seizures and syncope.  Hematological: Negative for adenopathy. Does not bruise/bleed easily.  Psychiatric/Behavioral: Negative for confusion.    Blood pressure 188/118, pulse 80, temperature 96.8 F (36 C), temperature source Temporal, resp. rate 20, height 5\' 10"  (1.778 m), weight 327 lb 3.2 oz (148.417 kg).  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  The surgical procedure has been discussed with the patient.  Potential risks, benefits, alternative treatments, and expected outcomes have been explained.  All of the patient's questions at this time have been answered.  The likelihood of reaching the patient's treatment goal is good.  The patient understand the proposed surgical procedure and wishes to proceed. I explained the possibility of recurrence of this type of lesion.       Mary Dolecki K. 04/09/2011, 5:05 PM

## 2011-04-09 NOTE — Patient Instructions (Signed)
We will schedule your surgery today.

## 2011-04-10 ENCOUNTER — Encounter (HOSPITAL_COMMUNITY): Payer: Self-pay

## 2011-04-10 ENCOUNTER — Encounter (HOSPITAL_COMMUNITY)
Admission: RE | Admit: 2011-04-10 | Discharge: 2011-04-10 | Disposition: A | Discharge status: Home / Self Care | Payer: Medicare Other | Source: Ambulatory Visit | Attending: Surgery | Admitting: Surgery

## 2011-04-10 ENCOUNTER — Ambulatory Visit (HOSPITAL_COMMUNITY)
Admission: RE | Admit: 2011-04-10 | Discharge: 2011-04-10 | Disposition: A | Discharge status: Home / Self Care | Payer: Medicare Other | Source: Ambulatory Visit | Attending: Surgery | Admitting: Surgery

## 2011-04-10 DIAGNOSIS — I517 Cardiomegaly: Secondary | ICD-10-CM | POA: Diagnosis not present

## 2011-04-10 DIAGNOSIS — A630 Anogenital (venereal) warts: Secondary | ICD-10-CM | POA: Diagnosis not present

## 2011-04-10 DIAGNOSIS — G4730 Sleep apnea, unspecified: Secondary | ICD-10-CM

## 2011-04-10 DIAGNOSIS — Z87410 Personal history of cervical dysplasia: Secondary | ICD-10-CM

## 2011-04-10 DIAGNOSIS — Z01818 Encounter for other preprocedural examination: Secondary | ICD-10-CM | POA: Diagnosis not present

## 2011-04-10 DIAGNOSIS — M47814 Spondylosis without myelopathy or radiculopathy, thoracic region: Secondary | ICD-10-CM | POA: Insufficient documentation

## 2011-04-10 DIAGNOSIS — N879 Dysplasia of cervix uteri, unspecified: Secondary | ICD-10-CM

## 2011-04-10 DIAGNOSIS — Z01812 Encounter for preprocedural laboratory examination: Secondary | ICD-10-CM | POA: Insufficient documentation

## 2011-04-10 DIAGNOSIS — M1990 Unspecified osteoarthritis, unspecified site: Secondary | ICD-10-CM

## 2011-04-10 DIAGNOSIS — Z01811 Encounter for preprocedural respiratory examination: Secondary | ICD-10-CM | POA: Diagnosis not present

## 2011-04-10 HISTORY — PX: BACK SURGERY: SHX140

## 2011-04-10 HISTORY — PX: LUMBAR DISC SURGERY: SHX700

## 2011-04-10 HISTORY — DX: Unspecified osteoarthritis, unspecified site: M19.90

## 2011-04-10 HISTORY — DX: Sleep apnea, unspecified: G47.30

## 2011-04-10 HISTORY — PX: CERVICAL FUSION: SHX112

## 2011-04-10 HISTORY — DX: Personal history of cervical dysplasia: Z87.410

## 2011-04-10 HISTORY — DX: Dysplasia of cervix uteri, unspecified: N87.9

## 2011-04-10 HISTORY — PX: NECK SURGERY: SHX720

## 2011-04-10 LAB — HCG, SERUM, QUALITATIVE: Preg, Serum: NEGATIVE

## 2011-04-10 LAB — CBC
HCT: 46.1 % — ABNORMAL HIGH (ref 36.0–46.0)
Hemoglobin: 16.8 g/dL — ABNORMAL HIGH (ref 12.0–15.0)
MCH: 29.2 pg (ref 26.0–34.0)
RBC: 5.76 MIL/uL — ABNORMAL HIGH (ref 3.87–5.11)

## 2011-04-10 LAB — BASIC METABOLIC PANEL
CO2: 30 mEq/L (ref 19–32)
Chloride: 103 mEq/L (ref 96–112)
GFR calc non Af Amer: 81 mL/min — ABNORMAL LOW (ref >90)
Glucose, Bld: 96 mg/dL (ref 70–99)
Potassium: 4 mEq/L (ref 3.5–5.1)
Sodium: 139 mEq/L (ref 135–145)

## 2011-04-10 NOTE — Patient Instructions (Signed)
[place]  [id number]   Your procedure is scheduled on: 04-17-11  Report to [facility] at    0900    AM.  Call this number if you have problems the morning of surgery: [phone]   Remember:   Do not eat food:After Midnight.    Take these medicines the morning of surgery with A SIP OF WATER: Abilify, Zytrec, Nexium, Metoprolol, Singulair, Zocor, Effexor. Use and bring Albuterol inhaler.   Do not wear jewelry, make-up or nail polish.  Do not wear lotions, powders, or perfumes. You may wear deodorant.  Do not shave 48 hours prior to surgery.(no shaving of legs)  Do not bring valuables to the hospital.  Contacts, dentures or bridgework may not be worn into surgery.  Leave suitcase in the car. After surgery it may be brought to your room.  For patients admitted to the hospital, checkout time is 11:00 AM the day of discharge.   Patients discharged the day of surgery will not be allowed to drive home.  Name and phone number of your driver: sister  Special Instructions: CHG Shower Use Special Wash: 1/2 bottle night before surgery and 1/2 bottle morning of surgery.(avoid face and vagina)   Please read over the following fact sheets that you were given: MRSA Information

## 2011-04-10 NOTE — Pre-Procedure Instructions (Addendum)
04-10-11 -EKG(03-15-11) with chart, Echo (5'10), Sleep Study (1'10) with chart. CXR done today. Dr. Eliseo Squires made aware of elevated B/P-pt instructed to make Dr. [name] and PCP aware, also instructed per Dr. Eliseo Squires to take both Metoprolol and Benazepril AM of surgery. Berlinda Last, RN 04-10-11 1330 [facility] called to say pt. Okay for surgery.W. Floy Sabina

## 2011-04-10 NOTE — Progress Notes (Signed)
Quick Note:  This patient may proceed with surgery ______

## 2011-04-11 ENCOUNTER — Encounter (HOSPITAL_COMMUNITY): Payer: Self-pay | Admitting: Pharmacy Technician

## 2011-04-11 ENCOUNTER — Ambulatory Visit: Payer: Medicare Other

## 2011-04-17 ENCOUNTER — Encounter (HOSPITAL_COMMUNITY): Payer: Self-pay | Admitting: Anesthesiology

## 2011-04-17 ENCOUNTER — Ambulatory Visit (HOSPITAL_COMMUNITY): Payer: Medicare Other | Admitting: Anesthesiology

## 2011-04-17 ENCOUNTER — Encounter (HOSPITAL_COMMUNITY)
Admission: RE | Disposition: A | Discharge status: Home / Self Care | Payer: Self-pay | Source: Ambulatory Visit | Attending: Surgery

## 2011-04-17 ENCOUNTER — Ambulatory Visit (HOSPITAL_COMMUNITY)
Admission: RE | Admit: 2011-04-17 | Discharge: 2011-04-17 | Disposition: A | Discharge status: Home / Self Care | Payer: Medicare Other | Source: Ambulatory Visit | Attending: Surgery | Admitting: Surgery

## 2011-04-17 ENCOUNTER — Encounter (HOSPITAL_COMMUNITY): Payer: Self-pay | Admitting: *Deleted

## 2011-04-17 DIAGNOSIS — G4730 Sleep apnea, unspecified: Secondary | ICD-10-CM | POA: Diagnosis not present

## 2011-04-17 DIAGNOSIS — F17200 Nicotine dependence, unspecified, uncomplicated: Secondary | ICD-10-CM | POA: Diagnosis not present

## 2011-04-17 DIAGNOSIS — A630 Anogenital (venereal) warts: Secondary | ICD-10-CM | POA: Diagnosis not present

## 2011-04-17 DIAGNOSIS — Z79899 Other long term (current) drug therapy: Secondary | ICD-10-CM | POA: Diagnosis not present

## 2011-04-17 DIAGNOSIS — Z7982 Long term (current) use of aspirin: Secondary | ICD-10-CM | POA: Diagnosis not present

## 2011-04-17 DIAGNOSIS — J449 Chronic obstructive pulmonary disease, unspecified: Secondary | ICD-10-CM | POA: Insufficient documentation

## 2011-04-17 DIAGNOSIS — E785 Hyperlipidemia, unspecified: Secondary | ICD-10-CM | POA: Diagnosis not present

## 2011-04-17 DIAGNOSIS — I10 Essential (primary) hypertension: Secondary | ICD-10-CM | POA: Insufficient documentation

## 2011-04-17 DIAGNOSIS — K219 Gastro-esophageal reflux disease without esophagitis: Secondary | ICD-10-CM | POA: Insufficient documentation

## 2011-04-17 DIAGNOSIS — J4489 Other specified chronic obstructive pulmonary disease: Secondary | ICD-10-CM | POA: Insufficient documentation

## 2011-04-17 HISTORY — PX: ANAL FISTULECTOMY: SHX1139

## 2011-04-17 SURGERY — FISTULECTOMY, ANAL
Anesthesia: General | Site: Anus | Wound class: Clean

## 2011-04-17 MED ORDER — LIDOCAINE HCL (CARDIAC) 20 MG/ML IV SOLN
INTRAVENOUS | Status: DC | PRN
  Administered 2011-04-17: 100 mg via INTRAVENOUS

## 2011-04-17 MED ORDER — LACTATED RINGERS IV SOLN: INTRAVENOUS | Status: DC

## 2011-04-17 MED ORDER — BUPIVACAINE-EPINEPHRINE 0.25% -1:200000 IJ SOLN
INTRAMUSCULAR | Status: AC
  Filled 2011-04-17: qty 1

## 2011-04-17 MED ORDER — CEFAZOLIN SODIUM 1-5 GM-% IV SOLN
INTRAVENOUS | Status: AC
  Filled 2011-04-17: qty 100

## 2011-04-17 MED ORDER — FENTANYL CITRATE 0.05 MG/ML IJ SOLN
INTRAMUSCULAR | Status: DC | PRN
  Administered 2011-04-17 (×2): 100 ug via INTRAVENOUS
  Administered 2011-04-17: 50 ug via INTRAVENOUS

## 2011-04-17 MED ORDER — ONDANSETRON HCL 4 MG/2ML IJ SOLN
INTRAMUSCULAR | Status: DC | PRN
  Administered 2011-04-17: 4 mg via INTRAVENOUS

## 2011-04-17 MED ORDER — OXYCODONE-ACETAMINOPHEN 5-325 MG PO TABS: 1.0000 | ORAL_TABLET | ORAL | Status: DC | PRN

## 2011-04-17 MED ORDER — LACTATED RINGERS IV SOLN
INTRAVENOUS | Status: DC
  Administered 2011-04-17: 11:00:00 via INTRAVENOUS

## 2011-04-17 MED ORDER — BUPIVACAINE-EPINEPHRINE PF 0.25-1:200000 % IJ SOLN
INTRAMUSCULAR | Status: DC | PRN
  Administered 2011-04-17: 10 mL

## 2011-04-17 MED ORDER — ONDANSETRON HCL 4 MG/2ML IJ SOLN: 4.0000 mg | INTRAMUSCULAR | Status: DC | PRN

## 2011-04-17 MED ORDER — FENTANYL CITRATE 0.05 MG/ML IJ SOLN: 25.0000 ug | INTRAMUSCULAR | Status: DC | PRN

## 2011-04-17 MED ORDER — CEFAZOLIN SODIUM-DEXTROSE 2-3 GM-% IV SOLR
2.0000 g | INTRAVENOUS | Status: AC
  Administered 2011-04-17: 2 g via INTRAVENOUS

## 2011-04-17 MED ORDER — MIDAZOLAM HCL 5 MG/5ML IJ SOLN
INTRAMUSCULAR | Status: DC | PRN
  Administered 2011-04-17: 2 mg via INTRAVENOUS

## 2011-04-17 MED ORDER — SILVER SULFADIAZINE 1 % EX CREA
TOPICAL_CREAM | Freq: Once | CUTANEOUS | Status: DC
  Filled 2011-04-17: qty 20

## 2011-04-17 MED ORDER — PROPOFOL 10 MG/ML IV EMUL
INTRAVENOUS | Status: DC | PRN
  Administered 2011-04-17 (×2): 100 mg via INTRAVENOUS
  Administered 2011-04-17: 200 mg via INTRAVENOUS

## 2011-04-17 MED ORDER — SUCCINYLCHOLINE CHLORIDE 20 MG/ML IJ SOLN
INTRAMUSCULAR | Status: DC | PRN
  Administered 2011-04-17: 100 mg via INTRAVENOUS

## 2011-04-17 MED ORDER — MORPHINE SULFATE 10 MG/ML IJ SOLN: 2.0000 mg | INTRAMUSCULAR | Status: DC | PRN

## 2011-04-17 SURGICAL SUPPLY — 39 items
BLADE HEX COATED 2.75 (ELECTRODE) ×2 IMPLANT
BLADE SURG 15 STRL LF DISP TIS (BLADE) ×1 IMPLANT
BLADE SURG 15 STRL SS (BLADE) ×2
CLOTH BEACON ORANGE TIMEOUT ST (SAFETY) ×2 IMPLANT
COVER SURGICAL LIGHT HANDLE (MISCELLANEOUS) ×2 IMPLANT
DECANTER SPIKE VIAL GLASS SM (MISCELLANEOUS) ×2 IMPLANT
DRAIN PENROSE 18X1/2 LTX STRL (DRAIN) ×1 IMPLANT
DRAPE LG THREE QUARTER DISP (DRAPES) ×2 IMPLANT
DRAPE UTILITY XL STRL (DRAPES) ×2 IMPLANT
DRSG PAD ABDOMINAL 8X10 ST (GAUZE/BANDAGES/DRESSINGS) ×2 IMPLANT
ELECT REM PT RETURN 9FT ADLT (ELECTROSURGICAL) ×2
ELECTRODE REM PT RTRN 9FT ADLT (ELECTROSURGICAL) ×1 IMPLANT
GAUZE SPONGE 4X4 16PLY XRAY LF (GAUZE/BANDAGES/DRESSINGS) ×2 IMPLANT
GLOVE BIO SURGEON STRL SZ7 (GLOVE) ×2 IMPLANT
GLOVE BIOGEL PI IND STRL 7.0 (GLOVE) ×1 IMPLANT
GLOVE BIOGEL PI IND STRL 7.5 (GLOVE) ×1 IMPLANT
GLOVE BIOGEL PI INDICATOR 7.0 (GLOVE) ×1
GLOVE BIOGEL PI INDICATOR 7.5 (GLOVE) ×1
GOWN STRL NON-REIN LRG LVL3 (GOWN DISPOSABLE) ×4 IMPLANT
GOWN STRL REIN XL XLG (GOWN DISPOSABLE) ×2 IMPLANT
KIT BASIN OR (CUSTOM PROCEDURE TRAY) ×2 IMPLANT
LUBRICANT JELLY ST 5GR 8946 (MISCELLANEOUS) IMPLANT
NDL HYPO 25X1 1.5 SAFETY (NEEDLE) ×1 IMPLANT
NDL SAFETY ECLIPSE 18X1.5 (NEEDLE) ×1 IMPLANT
NEEDLE HYPO 18GX1.5 SHARP (NEEDLE)
NEEDLE HYPO 25X1 1.5 SAFETY (NEEDLE) IMPLANT
NS IRRIG 1000ML POUR BTL (IV SOLUTION) IMPLANT
PACK LITHOTOMY IV (CUSTOM PROCEDURE TRAY) ×2 IMPLANT
PENCIL BUTTON HOLSTER BLD 10FT (ELECTRODE) ×2 IMPLANT
SPONGE GAUZE 4X4 12PLY (GAUZE/BANDAGES/DRESSINGS) ×2 IMPLANT
SPONGE SURGIFOAM ABS GEL 12-7 (HEMOSTASIS) ×4 IMPLANT
SUT CHROMIC 2 0 SH (SUTURE) IMPLANT
SUT CHROMIC 3 0 SH 27 (SUTURE) IMPLANT
SYR CONTROL 10ML LL (SYRINGE) ×2 IMPLANT
TAPE CLOTH SURG 4X10 WHT LF (GAUZE/BANDAGES/DRESSINGS) ×1 IMPLANT
TOWEL OR 17X26 10 PK STRL BLUE (TOWEL DISPOSABLE) ×2 IMPLANT
UNDERPAD 30X30 INCONTINENT (UNDERPADS AND DIAPERS) ×2 IMPLANT
WATER STERILE IRR 1500ML POUR (IV SOLUTION) IMPLANT
YANKAUER SUCT BULB TIP 10FT TU (MISCELLANEOUS) ×2 IMPLANT

## 2011-04-17 NOTE — Progress Notes (Signed)
Ambulated in hallway tolerated well.

## 2011-04-17 NOTE — Anesthesia Preprocedure Evaluation (Addendum)
Anesthesia Evaluation    Airway Mallampati: III      Dental  (+) Edentulous Upper and Dental Advisory Given   Pulmonary sleep apnea , COPD COPD inhaler,    Pulmonary exam normal       Cardiovascular hypertension, Pt. on home beta blockers and Pt. on medications  Prolonged QT   Neuro/Psych Depression    GI/Hepatic GERD-  Medicated and Controlled,  Endo/Other    Renal/GU      Musculoskeletal   Abdominal   Peds  Hematology   Anesthesia Other Findings   Reproductive/Obstetrics                          Anesthesia Physical Anesthesia Plan  ASA: III  Anesthesia Plan: General   Post-op Pain Management:    Induction: Intravenous  Airway Management Planned: LMA  Additional Equipment:   Intra-op Plan:   Post-operative Plan:   Informed Consent:   Plan Discussed with: Surgeon  Anesthesia Plan Comments:         Anesthesia Quick Evaluation

## 2011-04-17 NOTE — Anesthesia Postprocedure Evaluation (Signed)
  Anesthesia Post-op Note  Patient: [name]  Procedure(s) Performed: Procedure(s) (LRB): FISTULECTOMY ANAL (N/A)  Patient Location: PACU  Anesthesia Type: General  Level of Consciousness: awake and alert   Airway and Oxygen Therapy: Patient Spontanous Breathing  Post-op Pain: mild  Post-op Assessment: Post-op Vital signs reviewed, Patient's Cardiovascular Status Stable, Respiratory Function Stable, Patent Airway and No signs of Nausea or vomiting  Post-op Vital Signs: stable  Complications: No apparent anesthesia complications

## 2011-04-17 NOTE — H&P (View-Only) (Signed)
Patient ID: [name], female   DOB: 01/17/1966, 46 y.o.   MRN: [id number]  Chief Complaint  Patient presents with  . Other    new pt- eval wart    HPI [name] is a 46 y.o. female.  Referred by Dr. Garret Reddish - [facility] HPI This is a 46 year old female with a number of medical problems who presents with a 5 month history of a lump growing at the area near her tailbone. She has had previous problems with "boils" and is thought that this represented another boil. Usually she treats these skin infections with "fat back" or holding a potato over the area. These were unsuccessful and the skin lesion has gotten much larger she went to her primary care physician for evaluation. He felt that this represented a wart-like lesion and is now referring the patient for surgical evaluation. She states that this area is tender but denies any bleeding or infection. Past Medical History  Diagnosis Date  . Hyperlipidemia   . Sleep apnea   . Migraine headache   . Hypertension   . COPD (chronic obstructive pulmonary disease)   . Fibrocystic breast disease   . GERD (gastroesophageal reflux disease)   . Alopecia     Past Surgical History  Procedure Date  . Back surgery     x5  . Breast cyst excision     bilateral breast  . Neck surgery     x3    Family History  Problem Relation Age of Onset  . Stroke Father   . Cancer Maternal Aunt     breast    Social History History  Substance Use Topics  . Smoking status: Current Everyday Smoker -- 1.5 packs/day for 30 years    Types: Cigarettes  . Smokeless tobacco: Not on file  . Alcohol Use: Yes    Allergies  Allergen Reactions  . Levofloxacin   . Methadone Hcl     REACTION: "blacked out" in 1990s    Current Outpatient Prescriptions  Medication Sig Dispense Refill  . ARIPiprazole (ABILIFY) 5 MG tablet Take 2 tablets (10 mg total) by mouth daily.  30 tablet  0  . aspirin 81 MG tablet Take 1 tablet (81 mg total) by  mouth daily.  30 tablet  11  . benazepril-hydrochlorthiazide (LOTENSIN HCT) 20-12.5 MG per tablet Take 1 tablet by mouth daily.  30 tablet  11  . Cetirizine HCl 10 MG CAPS Take 1 capsule (10 mg total) by mouth daily.  30 capsule  11  . esomeprazole (NEXIUM) 20 MG capsule Take 1 capsule (20 mg total) by mouth 2 (two) times daily.  60 capsule  6  . metoprolol succinate (TOPROL-XL) 50 MG 24 hr tablet Take 1 tablet (50 mg total) by mouth daily.  30 tablet  11  . montelukast (SINGULAIR) 10 MG tablet Take 1 tablet (10 mg total) by mouth daily.  30 tablet  11  . simvastatin (ZOCOR) 10 MG tablet Take 1 tablet (10 mg total) by mouth at bedtime.  90 tablet  1  . venlafaxine (EFFEXOR) 75 MG tablet Take 2 tablets (150 mg total) by mouth daily.  30 tablet  6    Review of Systems Review of Systems  Constitutional: Negative for fever, chills and unexpected weight change.  HENT: Negative for hearing loss, congestion, sore throat, trouble swallowing and voice change.   Eyes: Negative for visual disturbance.  Respiratory: Positive for cough. Negative for wheezing.   Cardiovascular: Negative  for chest pain, palpitations and leg swelling.  Gastrointestinal: Negative for nausea, vomiting, abdominal pain, diarrhea, constipation, blood in stool, abdominal distention and anal bleeding.  Genitourinary: Negative for hematuria, vaginal bleeding and difficulty urinating.  Musculoskeletal: Negative for arthralgias.  Skin: Negative for rash and wound.  Neurological: Positive for headaches. Negative for seizures and syncope.  Hematological: Negative for adenopathy. Does not bruise/bleed easily.  Psychiatric/Behavioral: Negative for confusion.    Blood pressure 188/118, pulse 80, temperature 96.8 F (36 C), temperature source Temporal, resp. rate 20, height 5\' 10"  (1.778 m), weight 327 lb 3.2 oz (148.417 kg).  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  The surgical procedure has been discussed with the patient.  Potential risks, benefits, alternative treatments, and expected outcomes have been explained.  All of the patient's questions at this time have been answered.  The likelihood of reaching the patient's treatment goal is good.  The patient understand the proposed surgical procedure and wishes to proceed. I explained the possibility of recurrence of this type of lesion.       [name] K. 04/09/2011, 5:05 PM

## 2011-04-17 NOTE — Interval H&P Note (Signed)
History and Physical Interval Note:  [id number] AM  [name]  has presented today for surgery, with the diagnosis of CONDYLOMA   The various methods of treatment have been discussed with the patient and family. After consideration of risks, benefits and other options for treatment, the patient has consented to  Procedure(s) (LRB): FISTULECTOMY ANAL (N/A) as a surgical intervention .  The patients' history has been reviewed, patient examined, no change in status, stable for surgery.  I have reviewed the patients' chart and labs.  Questions were answered to the patient's satisfaction.     Reeder Brisby K.

## 2011-04-17 NOTE — Op Note (Signed)
Pre-op Diagnosis - condyloma gluteal cleft Post-op diagnosis - same Procedure - Excision and fulguration of condyloma - gluteal cleft Surgeon:  Maia Petties. Anesthesia:  GETT Indications:  46 yo female presents with 5 month history of a protruding lump near her coccyx.  This area has enlarged and appears to be a wart-like lesion.  She has no other evidence of gross disease near her anus or genitalia.  She presents for excision  Description of procedure:  The patient was brought to the operating room and placed in a supine position on a bean bag.  After an adequate level of anesthesia was obtained, she was positioned in left lateral position.  The area around the buttocks and lower back were prepped with Betadine and draped in sterile fashion.  The mass was visible and has a base about 2 cm.  We infiltrated with 0.25% marcaine.  We excised the entire mass with a full thickness of underlying skin.  We then thoroughly cauterized the entire base of the wound.  The wound was packed with 2x2 gauze and covered with a dry dressing.  She was extubated and brought to the recovery room in stable condition.  Imogene Burn. [name], MD, [facility] Surgery  04/17/2011 12:35 PM

## 2011-04-17 NOTE — Transfer of Care (Signed)
Immediate Anesthesia Transfer of Care Note  Patient: [name]  Procedure(s) Performed: Procedure(s) (LRB): FISTULECTOMY ANAL (N/A)  Patient Location: PACU  Anesthesia Type: General  Level of Consciousness: awake, alert  and oriented  Airway & Oxygen Therapy: Patient Spontanous Breathing and Patient connected to face mask oxygen  Post-op Assessment: Report given to PACU RN and Post -op Vital signs reviewed and stable  Post vital signs: Reviewed and stable  Complications: No apparent anesthesia complications

## 2011-04-17 NOTE — Discharge Instructions (Signed)
You may shower daily. BID dressing changes with silvadene ointment, then cover with dry gauze and tape. You may use the pain medicine as needed.  Use milk of magnesia or stool softeners as needed to avoid constipation.  Call our office at [phone] for any problems and to schedule a follow-up appointment in 2-3 weeks.

## 2011-04-17 NOTE — Preoperative (Signed)
Beta Blockers   Reason not to administer Beta Blockers:pt stated took toprol this am

## 2011-04-19 ENCOUNTER — Encounter: Payer: Self-pay | Admitting: Family Medicine

## 2011-04-23 ENCOUNTER — Encounter: Payer: Self-pay | Admitting: Pharmacist

## 2011-04-23 ENCOUNTER — Encounter (INDEPENDENT_AMBULATORY_CARE_PROVIDER_SITE_OTHER): Payer: Self-pay | Admitting: Surgery

## 2011-04-23 ENCOUNTER — Ambulatory Visit (INDEPENDENT_AMBULATORY_CARE_PROVIDER_SITE_OTHER): Payer: Medicare Other | Admitting: Surgery

## 2011-04-23 ENCOUNTER — Ambulatory Visit (INDEPENDENT_AMBULATORY_CARE_PROVIDER_SITE_OTHER): Payer: Medicare Other | Admitting: Pharmacist

## 2011-04-23 VITALS — BP 158/90 | HR 80 | Ht 70.0 in | Wt 324.7 lb

## 2011-04-23 VITALS — BP 146/92 | HR 80 | Temp 98.6°F | Resp 16 | Ht 70.0 in | Wt 328.8 lb

## 2011-04-23 DIAGNOSIS — F17200 Nicotine dependence, unspecified, uncomplicated: Secondary | ICD-10-CM | POA: Diagnosis not present

## 2011-04-23 DIAGNOSIS — A630 Anogenital (venereal) warts: Secondary | ICD-10-CM

## 2011-04-23 MED ORDER — NICOTINE 21 MG/24HR TD PT24: 1.0000 | MEDICATED_PATCH | TRANSDERMAL | Status: DC

## 2011-04-23 MED ORDER — NICOTINE POLACRILEX 2 MG MT LOZG: 2.0000 mg | LOZENGE | OROMUCOSAL | Status: DC | PRN

## 2011-04-23 NOTE — Patient Instructions (Signed)
Quit Smoking Date is tomorrow!!!  3/27  Start Nicotine patch 21mg  daily tomorrow AM  Start Nicotine Lozenge 2mg  as needed for cravings. Use up to 10 lozenges a day. Follow up with an office visit in about 1 month. We will call you next week to check on how you are doing   Walk More!  Keep busy!

## 2011-04-23 NOTE — Assessment & Plan Note (Signed)
ASSESSMENT Patient's stage of change (readiness scale): 10 READY: Ready to set action plan and implement: NRT   Behavior change readiness assessment::  action - ready to set action plan and implement behavioral changes   PLAN - Patient set quit date of tomorow 04/23/11 - Initiate Nicotine patch 21 mg daily  - Initiate Nicotine lozenge 2 mg PRN cravings, up to 10 lozenges per day - Patient to implement her behavioral changes including her plans of not purchasing cigarettes, praying, and staying active by starting to walk around her block every day, as well as joining the [facility] to start swimming

## 2011-04-23 NOTE — Patient Instructions (Signed)
Continue daily dressing changes.  You may shower over the wound.  Keep your appointment.

## 2011-04-23 NOTE — Progress Notes (Deleted)
  Subjective:    Patient ID: [name], female    DOB: 20-Jul-1965, 46 y.o.   MRN: [id number]  HPI   Review of Systems     Objective:   Physical Exam .smo      Assessment & Plan:  Subjective: Patient arrives in good spirits and expresses that she is excited to quit smoking   Reviewed intake note: yes  [name] is a 46 y.o. female who has been smoking 30 cigarettes per day for the past 30 years. There have been multiple attempts using chantix, and bupropion, to quit and some successes x 1 month.  The following triggers has/have been identified: psychological: house with the stress of living with her 66 and 64 year old sons  Subjective findings: Patient arrives in good spirits. Expresses that she is excited to quit smoking. .   ASSESSMENT Patient's stage of change (readiness scale): 10 READY: Ready to set action plan and implement: NRT   Behavior change readiness assessment::  action - ready to set action plan and implement behavioral changes   PLAN - Patient set quit date of tomorow 04/23/11 - Initiate Nicotine patch 21 mg daily  - Initiate Nicotine lozenge 2 mg PRN cravings, up to 10 lozenges per day - Patient to implement her behavioral changes including her plans of not purchasing cigarettes, praying, and staying active by starting to walk around her block every day, as well as joining the [facility] to start swimming

## 2011-04-23 NOTE — Progress Notes (Signed)
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  She has an open wound measuring about 3 x 2 x 2 cm.  She has been packing this with gauze.  She comes in today for wound check.  The drainage is purulent and foul-smelling, but the wound is beginning to granulate and does not track anywhere.  No surrounding cellulitis.  Continue dressing changes BID.  She has a follow-up appointment with me in 2 weeks.  Imogene Burn. [name], MD, [facility] Surgery  04/23/2011 2:25 PM

## 2011-04-23 NOTE — Progress Notes (Signed)
Patient ID: [name], female   DOB: [birth year], 46 y.o.   MRN: [id number]  Subjective: Patient arrives in good spirits and expresses that she is excited to quit smoking   Reviewed intake note: yes  [name] is a 47 y.o. female who has been smoking 30 cigarettes per day for the past 30 years. There have been multiple attempts using chantix, and bupropion, to quit and some successes x 1 month.  The following triggers has/have been identified: psychological: house with the stress of living with her 40 and 79 year old sons  Subjective findings: Patient arrives in good spirits. Expresses that she is excited to quit smoking. .   ASSESSMENT Patient's stage of change (readiness scale): 10 READY: Ready to set action plan and implement: NRT   Behavior change readiness assessment::  action - ready to set action plan and implement behavioral changes   PLAN - Patient set quit date of tomorow 04/23/11 - Initiate Nicotine patch 21 mg daily  - Initiate Nicotine lozenge 2 mg PRN cravings, up to 10 lozenges per day - Patient to implement her behavioral changes including her plans of not purchasing cigarettes, praying, and staying active by starting to walk around her block every day, as well as joining the [facility] to start swimming

## 2011-04-26 ENCOUNTER — Encounter (HOSPITAL_COMMUNITY): Payer: Self-pay | Admitting: Surgery

## 2011-04-29 NOTE — Progress Notes (Signed)
  Subjective:    Patient ID: [name], female    DOB: 11/13/1965, 46 y.o.   MRN: [id number]  HPI Reviewed and agree with Dr. Graylin Shiver management.     Review of Systems     Objective:   Physical Exam        Assessment & Plan:

## 2011-05-07 ENCOUNTER — Encounter: Payer: Self-pay | Admitting: Family Medicine

## 2011-05-07 ENCOUNTER — Ambulatory Visit (INDEPENDENT_AMBULATORY_CARE_PROVIDER_SITE_OTHER): Payer: Medicare Other | Admitting: Family Medicine

## 2011-05-07 VITALS — BP 184/96 | HR 92 | Temp 98.4°F | Ht 70.0 in | Wt 321.0 lb

## 2011-05-07 DIAGNOSIS — G4733 Obstructive sleep apnea (adult) (pediatric): Secondary | ICD-10-CM | POA: Diagnosis not present

## 2011-05-07 DIAGNOSIS — K219 Gastro-esophageal reflux disease without esophagitis: Secondary | ICD-10-CM

## 2011-05-07 DIAGNOSIS — E669 Obesity, unspecified: Secondary | ICD-10-CM | POA: Diagnosis not present

## 2011-05-07 DIAGNOSIS — I10 Essential (primary) hypertension: Secondary | ICD-10-CM | POA: Diagnosis not present

## 2011-05-07 DIAGNOSIS — L989 Disorder of the skin and subcutaneous tissue, unspecified: Secondary | ICD-10-CM | POA: Diagnosis not present

## 2011-05-07 DIAGNOSIS — F39 Unspecified mood [affective] disorder: Secondary | ICD-10-CM

## 2011-05-07 DIAGNOSIS — F17200 Nicotine dependence, unspecified, uncomplicated: Secondary | ICD-10-CM | POA: Diagnosis not present

## 2011-05-07 MED ORDER — ARIPIPRAZOLE 10 MG PO TABS: 10.0000 mg | ORAL_TABLET | Freq: Every day | ORAL | Status: DC

## 2011-05-07 MED ORDER — ESOMEPRAZOLE MAGNESIUM 20 MG PO CPDR: 20.0000 mg | DELAYED_RELEASE_CAPSULE | Freq: Every day | ORAL | Status: DC

## 2011-05-07 MED ORDER — BENAZEPRIL-HYDROCHLOROTHIAZIDE 20-12.5 MG PO TABS: 1.0000 | ORAL_TABLET | Freq: Two times a day (BID) | ORAL | Status: DC

## 2011-05-07 NOTE — Patient Instructions (Signed)
Dear . [name],   It was great to see you again today. Thank you for coming to clinic. Please read below regarding the issues that we discussed.   1. Your blood pressure was still high today. I want you to take your Benazepril-Hctz once in the morning and once in the evening. I want to see you back in a month to recheck your blood pressure.  2. I gave you a handwritten prescription for cpap mask. I want you to go to a home health agency of your choice and get that filled. I believe controlling your sleep apnea may improve your blood pressure.  3. I refilled both your nexium and your abilify.  4. I am THRILLED to hear that you are feeling so much better as far as your spirits. I am very glad you have someone to talk to and a plan for when to talk to them. Your baby sister sounds like a true gift for you. Please let us know if you need anything. Remember if you ever have thoughts of harming yourself or others, call 911 or go to emergency room immediately.  5. For your smoking, you set a quit date of tomorrow. I want you to follow up with Dr. Valentina Lucks as scheduled to see how things are going and makes adjustments as needed. I am so proud of you for making this a priority.   Please follow up in clinic in 4 weeks . Please call earlier if you have any questions or concerns.   Sincerely,  Dr. Garret Reddish

## 2011-05-07 NOTE — Progress Notes (Signed)
Patient ID: [name], female   DOB: 1965/10/30, 46 y.o.   MRN: [id number]   Subjective:    Patient ID: [name], female    DOB: 10-Nov-1965, 46 y.o.   MRN: [id number]  HPI [name] is a 46 F with a history of depression with mood disorder, COPD due to tobacco abuse, OSA, HTN presenting for follow up of blood pressure and other medical problems.    1. Depression-patient says she decided to meet with her baby sister who is a Company secretary instead of meeting with Dr. Gwenlyn Saran .She says she was able to "get everything out". She is tolerating all of her medications. PHQ9 score shows 3 (points for 1,4,7). At last visit, score was 19 and at first visit I had with patient score of 24. She is thrilled that her spirits are so much better. No HI/SI.   2 Gluteal lesion now-s/p removal with Dr. [name]. COnfirmed to be condyloma by path. Patient excited was able to get it removed so quickly. Has follow up in about 2 weeks.   3. HTN-compliant with 2 medications. Despite compliance, BP very elevated even with repeat with large cuff showing 180/102.  Patient denies CP/SOB/HA/blurry vision/orthopnea/PND/increased leg swelling. Patient has a history of OSA on cpap but no longer uses-could contribute to high BPs when on medicine. No side effects of medicines.  4. Tobacco abuse-now down to 1/2 ppd from 1.5 ppd. Met with Dr. Valentina Lucks and set quit date but says she was only able to cut down. Still using nicotine patch as well as 10 losenges per day. SHe is willing to set another quit date of tomorrow before follow up with Dr. Valentina Lucks. Has been walking every other day for approximately 15 minutes but can't get herself to walk when she has cravings. Concerning copd-says breathing is much improved and doesn't get SOB while walking. Has not had to use albuterol.   5. OSA_likely contributing to #3. Snores a lot. Daytime somnolence. Has machine but no mask. Wants rx and will pick a home health agency.   6. Obesity-walking 3  x weekly. Has lost 7 lbs from peak weight of 328 this year. Plans to continue walking.    Review of Systems -See HPI  Past Medical History-smoking status noted: current smoker.  Reviewed problem list.  Medications- reviewed and updated Chief complaint-noted     Objective:   Physical Exam BP 184/96  Pulse 92  Temp(Src) 98.4 F (36.9 C) (Oral)  Ht 5\' 10"  (1.778 m)  Wt 321 lb (145.605 kg)  BMI 46.06 kg/m2  LMP 04/10/2006 Constitutional: morbidly obeses. She appears well-developed and well-nourished. No distress. Patient is neatly dressed and smiling throughout visit. Mood/affect much improved from first visit.  Head: Normocephalic and atraumatic.  Mouth/Throat: Oropharynx is clear and moist. No oropharyngeal exudate.  Eyes: Pupils are equal, round, and reactive to light.  Neck: Normal range of motion. Neck supple.  Cardiovascular: Regular rhythm.  Exam reveals no gallop and no friction rub.  No murmur heard.  Pulmonary/Chest: Effort normal and breath sounds normal. No respiratory distress. She has no wheezes. She has no rales. She exhibits no tenderness.       Decreased breath sounds due to obesity.   Abdominal: Soft. Bowel sounds are normal. She exhibits no distension. There is no tenderness. There is no rebound.  Musculoskeletal: Normal range of motion. Trace edema noted. She exhibits no tenderness.  Neurological: She is alert and oriented to person, place, and time. moves extremities x4.  Skin: Skin is warm and dry.     Assessment & Plan:  Repeat BMET next visit Follow up within a month

## 2011-05-07 NOTE — Assessment & Plan Note (Signed)
7 lb weight loss from max this year. ENcouraged patient to continue walking.

## 2011-05-07 NOTE — Assessment & Plan Note (Signed)
Hand written rx for cpap mask/tubing. Patient to find home health agency to fill. Hope this will help with HTN.

## 2011-05-07 NOTE — Assessment & Plan Note (Signed)
Has cut smoking to 1/3 previous amount with aid of nicotine patch and lozenges. Patient will attempt a quit date of 4/10 before seeing Dr. Valentina Lucks within next week. WIll titrate nicotine as needed at next visit.

## 2011-05-07 NOTE — Assessment & Plan Note (Addendum)
Changed nexium to once daily for insurance purposes.

## 2011-05-07 NOTE — Assessment & Plan Note (Addendum)
Spirits much improved. PHQ 9 a 3 down from 24 when patient presented to me. Using family support with baby sister with every other week meetings. Patient does not want to meet with Dr. Gwenlyn Saran at this time. Refilled Abilify at 10mg  at this time.

## 2011-05-07 NOTE — Assessment & Plan Note (Addendum)
BMET next visit. Previously at goal. Patient states she is compliant. Has had elevated BPs during visits to surgery. Will increase benazepril-hctz 20-12.5 to BID. WIll increase metoprolol at next visit if still not well controlled. ALso hope tx with new CPAP mask for machine will make patient compliant and help her restart therapy.

## 2011-05-10 ENCOUNTER — Encounter (INDEPENDENT_AMBULATORY_CARE_PROVIDER_SITE_OTHER): Payer: Medicare Other | Admitting: Surgery

## 2011-05-15 ENCOUNTER — Telehealth: Payer: Self-pay | Admitting: Family Medicine

## 2011-05-15 NOTE — Telephone Encounter (Signed)
[facility] about abilify 5mg  refill request that was sent to office. They have the abilify 10mg  daily Rx on file and patient has filled it. They will call patient to make sure that she is set with her medicine.

## 2011-05-17 ENCOUNTER — Encounter (INDEPENDENT_AMBULATORY_CARE_PROVIDER_SITE_OTHER): Payer: Self-pay | Admitting: Surgery

## 2011-05-21 ENCOUNTER — Ambulatory Visit: Payer: Medicare Other | Admitting: Pharmacist

## 2011-08-09 ENCOUNTER — Other Ambulatory Visit: Payer: Self-pay | Admitting: *Deleted

## 2011-08-09 DIAGNOSIS — E785 Hyperlipidemia, unspecified: Secondary | ICD-10-CM

## 2011-08-09 MED ORDER — SIMVASTATIN 10 MG PO TABS: 10.0000 mg | ORAL_TABLET | Freq: Every day | ORAL | Status: DC

## 2011-08-09 NOTE — Telephone Encounter (Signed)
Refilled simvastatin. Patient needs to follow up within the month and no more refills before appt.

## 2012-01-29 DIAGNOSIS — I5032 Chronic diastolic (congestive) heart failure: Secondary | ICD-10-CM

## 2012-01-29 HISTORY — DX: Chronic diastolic (congestive) heart failure: I50.32

## 2012-04-03 ENCOUNTER — Encounter (HOSPITAL_COMMUNITY): Payer: Self-pay | Admitting: *Deleted

## 2012-04-03 ENCOUNTER — Emergency Department (HOSPITAL_COMMUNITY): Payer: Medicare Other

## 2012-04-03 ENCOUNTER — Inpatient Hospital Stay (HOSPITAL_COMMUNITY)
Admission: EM | Admit: 2012-04-03 | Discharge: 2012-04-07 | DRG: 291 | Disposition: A | Discharge status: Home / Self Care | Payer: Medicare Other | Attending: Family Medicine | Admitting: Family Medicine

## 2012-04-03 DIAGNOSIS — F3289 Other specified depressive episodes: Secondary | ICD-10-CM | POA: Diagnosis present

## 2012-04-03 DIAGNOSIS — Z9119 Patient's noncompliance with other medical treatment and regimen: Secondary | ICD-10-CM

## 2012-04-03 DIAGNOSIS — E785 Hyperlipidemia, unspecified: Secondary | ICD-10-CM | POA: Diagnosis present

## 2012-04-03 DIAGNOSIS — R0989 Other specified symptoms and signs involving the circulatory and respiratory systems: Secondary | ICD-10-CM | POA: Diagnosis not present

## 2012-04-03 DIAGNOSIS — K219 Gastro-esophageal reflux disease without esophagitis: Secondary | ICD-10-CM | POA: Diagnosis present

## 2012-04-03 DIAGNOSIS — E876 Hypokalemia: Secondary | ICD-10-CM | POA: Diagnosis present

## 2012-04-03 DIAGNOSIS — J4489 Other specified chronic obstructive pulmonary disease: Secondary | ICD-10-CM

## 2012-04-03 DIAGNOSIS — I509 Heart failure, unspecified: Secondary | ICD-10-CM | POA: Diagnosis present

## 2012-04-03 DIAGNOSIS — J811 Chronic pulmonary edema: Secondary | ICD-10-CM | POA: Diagnosis not present

## 2012-04-03 DIAGNOSIS — J9600 Acute respiratory failure, unspecified whether with hypoxia or hypercapnia: Secondary | ICD-10-CM | POA: Diagnosis present

## 2012-04-03 DIAGNOSIS — R0600 Dyspnea, unspecified: Secondary | ICD-10-CM | POA: Diagnosis present

## 2012-04-03 DIAGNOSIS — I5032 Chronic diastolic (congestive) heart failure: Secondary | ICD-10-CM | POA: Diagnosis present

## 2012-04-03 DIAGNOSIS — R0602 Shortness of breath: Secondary | ICD-10-CM | POA: Diagnosis not present

## 2012-04-03 DIAGNOSIS — F329 Major depressive disorder, single episode, unspecified: Secondary | ICD-10-CM | POA: Diagnosis present

## 2012-04-03 DIAGNOSIS — Z87891 Personal history of nicotine dependence: Secondary | ICD-10-CM | POA: Diagnosis present

## 2012-04-03 DIAGNOSIS — R0789 Other chest pain: Secondary | ICD-10-CM | POA: Diagnosis not present

## 2012-04-03 DIAGNOSIS — Z23 Encounter for immunization: Secondary | ICD-10-CM | POA: Diagnosis not present

## 2012-04-03 DIAGNOSIS — G4733 Obstructive sleep apnea (adult) (pediatric): Secondary | ICD-10-CM | POA: Diagnosis present

## 2012-04-03 DIAGNOSIS — R000 Tachycardia, unspecified: Secondary | ICD-10-CM | POA: Diagnosis present

## 2012-04-03 DIAGNOSIS — Z79899 Other long term (current) drug therapy: Secondary | ICD-10-CM

## 2012-04-03 DIAGNOSIS — F17200 Nicotine dependence, unspecified, uncomplicated: Secondary | ICD-10-CM | POA: Diagnosis present

## 2012-04-03 DIAGNOSIS — I161 Hypertensive emergency: Secondary | ICD-10-CM | POA: Diagnosis present

## 2012-04-03 DIAGNOSIS — E669 Obesity, unspecified: Secondary | ICD-10-CM | POA: Diagnosis present

## 2012-04-03 DIAGNOSIS — J449 Chronic obstructive pulmonary disease, unspecified: Secondary | ICD-10-CM | POA: Diagnosis not present

## 2012-04-03 DIAGNOSIS — R0603 Acute respiratory distress: Secondary | ICD-10-CM

## 2012-04-03 DIAGNOSIS — Z91199 Patient's noncompliance with other medical treatment and regimen due to unspecified reason: Secondary | ICD-10-CM

## 2012-04-03 DIAGNOSIS — Z981 Arthrodesis status: Secondary | ICD-10-CM

## 2012-04-03 DIAGNOSIS — I10 Essential (primary) hypertension: Secondary | ICD-10-CM | POA: Diagnosis present

## 2012-04-03 DIAGNOSIS — J441 Chronic obstructive pulmonary disease with (acute) exacerbation: Secondary | ICD-10-CM | POA: Diagnosis present

## 2012-04-03 DIAGNOSIS — I5033 Acute on chronic diastolic (congestive) heart failure: Principal | ICD-10-CM

## 2012-04-03 DIAGNOSIS — R0609 Other forms of dyspnea: Secondary | ICD-10-CM

## 2012-04-03 DIAGNOSIS — N179 Acute kidney failure, unspecified: Secondary | ICD-10-CM | POA: Diagnosis not present

## 2012-04-03 DIAGNOSIS — Z6841 Body Mass Index (BMI) 40.0 and over, adult: Secondary | ICD-10-CM | POA: Diagnosis not present

## 2012-04-03 HISTORY — DX: Unspecified asthma, uncomplicated: J45.909

## 2012-04-03 MED ORDER — ALBUTEROL (5 MG/ML) CONTINUOUS INHALATION SOLN
10.0000 mg/h | INHALATION_SOLUTION | Freq: Once | RESPIRATORY_TRACT | Status: AC
  Administered 2012-04-03: 10 mg/h via RESPIRATORY_TRACT
  Filled 2012-04-03: qty 20

## 2012-04-03 MED ORDER — NITROGLYCERIN 0.4 MG SL SUBL
0.4000 mg | SUBLINGUAL_TABLET | SUBLINGUAL | Status: DC | PRN
  Filled 2012-04-03: qty 25

## 2012-04-03 MED ORDER — IPRATROPIUM BROMIDE 0.02 % IN SOLN
0.5000 mg | Freq: Once | RESPIRATORY_TRACT | Status: AC
  Administered 2012-04-03: 0.5 mg via RESPIRATORY_TRACT
  Filled 2012-04-03: qty 2.5

## 2012-04-03 MED ORDER — CLONIDINE HCL 0.2 MG PO TABS
0.2000 mg | ORAL_TABLET | Freq: Once | ORAL | Status: AC
  Administered 2012-04-03: 0.2 mg via ORAL
  Filled 2012-04-03: qty 1

## 2012-04-03 NOTE — ED Notes (Signed)
Pt presents with c/c of asthma attack.  Pt st's she's had SOB for the past 3 days, worsening today.  Pt denies CP, dizziness, lightheadedness, tingling.  Pt st's she's recently getting over a cold.  Pt speaking in short sentences, pursed lip breathing, labored.  GCS 15, a&o x4.  Pt st's she has been using her albuterol inhaler today (multiple times) but that didn't seem to help. On arrival pt is not wheezing, breath sounds clear in all fields, just shallow inspirations.  HR 119.  Pt breathing easier with 02.  02 sat 94 on 2L.

## 2012-04-03 NOTE — ED Notes (Signed)
Best peak flow prior to neb = 250

## 2012-04-04 ENCOUNTER — Encounter (HOSPITAL_COMMUNITY): Payer: Self-pay | Admitting: *Deleted

## 2012-04-04 DIAGNOSIS — I161 Hypertensive emergency: Secondary | ICD-10-CM | POA: Diagnosis present

## 2012-04-04 DIAGNOSIS — J441 Chronic obstructive pulmonary disease with (acute) exacerbation: Secondary | ICD-10-CM | POA: Diagnosis present

## 2012-04-04 DIAGNOSIS — R0600 Dyspnea, unspecified: Secondary | ICD-10-CM | POA: Diagnosis present

## 2012-04-04 DIAGNOSIS — J811 Chronic pulmonary edema: Secondary | ICD-10-CM | POA: Diagnosis present

## 2012-04-04 LAB — CBC WITH DIFFERENTIAL/PLATELET
Basophils Absolute: 0 10*3/uL (ref 0.0–0.1)
Eosinophils Absolute: 0.1 10*3/uL (ref 0.0–0.7)
Eosinophils Relative: 1 % (ref 0–5)
HCT: 44.8 % (ref 36.0–46.0)
Lymphs Abs: 2.3 10*3/uL (ref 0.7–4.0)
MCH: 29.1 pg (ref 26.0–34.0)
MCV: 80 fL (ref 78.0–100.0)
Monocytes Absolute: 0.8 10*3/uL (ref 0.1–1.0)
Platelets: 297 10*3/uL (ref 150–400)
RDW: 13.9 % (ref 11.5–15.5)

## 2012-04-04 LAB — TROPONIN I: Troponin I: <0.3 ng/mL (ref <0.30)

## 2012-04-04 LAB — BASIC METABOLIC PANEL
BUN: 9 mg/dL (ref 6–23)
CO2: 25 mEq/L (ref 19–32)
Calcium: 9.3 mg/dL (ref 8.4–10.5)
Creatinine, Ser: 1.28 mg/dL — ABNORMAL HIGH (ref 0.50–1.10)
Glucose, Bld: 129 mg/dL — ABNORMAL HIGH (ref 70–99)

## 2012-04-04 LAB — MRSA PCR SCREENING: MRSA by PCR: NEGATIVE

## 2012-04-04 LAB — URINALYSIS, ROUTINE W REFLEX MICROSCOPIC
Bilirubin Urine: NEGATIVE
Hgb urine dipstick: NEGATIVE
Ketones, ur: NEGATIVE mg/dL
Nitrite: NEGATIVE
pH: 6 (ref 5.0–8.0)

## 2012-04-04 LAB — URINE MICROSCOPIC-ADD ON

## 2012-04-04 MED ORDER — POTASSIUM CHLORIDE CRYS ER 20 MEQ PO TBCR
40.0000 meq | EXTENDED_RELEASE_TABLET | Freq: Once | ORAL | Status: AC
  Administered 2012-04-04: 40 meq via ORAL
  Filled 2012-04-04: qty 2

## 2012-04-04 MED ORDER — SODIUM CHLORIDE 0.9 % IJ SOLN: 3.0000 mL | INTRAMUSCULAR | Status: DC | PRN

## 2012-04-04 MED ORDER — BIOTENE DRY MOUTH MT LIQD
15.0000 mL | Freq: Two times a day (BID) | OROMUCOSAL | Status: DC
Start: 2012-04-04 — End: 2012-04-07
  Administered 2012-04-04 – 2012-04-07 (×6): 15 mL via OROMUCOSAL

## 2012-04-04 MED ORDER — ALBUTEROL SULFATE (5 MG/ML) 0.5% IN NEBU
5.0000 mg | INHALATION_SOLUTION | RESPIRATORY_TRACT | Status: DC | PRN
  Administered 2012-04-04: 5 mg via RESPIRATORY_TRACT

## 2012-04-04 MED ORDER — DEXTROSE 5 % IV SOLN
1.0000 g | Freq: Once | INTRAVENOUS | Status: AC
  Administered 2012-04-04: 1 g via INTRAVENOUS
  Filled 2012-04-04: qty 10

## 2012-04-04 MED ORDER — HYDRALAZINE HCL 20 MG/ML IJ SOLN
10.0000 mg | Freq: Four times a day (QID) | INTRAMUSCULAR | Status: DC | PRN
  Administered 2012-04-04: 10 mg via INTRAVENOUS
  Filled 2012-04-04: qty 1

## 2012-04-04 MED ORDER — DEXTROSE 5 % IV SOLN
500.0000 mg | Freq: Once | INTRAVENOUS | Status: DC
  Administered 2012-04-04: 500 mg via INTRAVENOUS
  Filled 2012-04-04: qty 500

## 2012-04-04 MED ORDER — CLONIDINE HCL 0.1 MG PO TABS
0.1000 mg | ORAL_TABLET | Freq: Once | ORAL | Status: AC
  Administered 2012-04-04: 0.1 mg via ORAL
  Filled 2012-04-04: qty 1

## 2012-04-04 MED ORDER — ONDANSETRON HCL 4 MG/2ML IJ SOLN
4.0000 mg | Freq: Four times a day (QID) | INTRAMUSCULAR | Status: DC | PRN
  Administered 2012-04-04: 4 mg via INTRAVENOUS
  Filled 2012-04-04: qty 2

## 2012-04-04 MED ORDER — HEPARIN SODIUM (PORCINE) 5000 UNIT/ML IJ SOLN
5000.0000 [IU] | Freq: Three times a day (TID) | INTRAMUSCULAR | Status: DC
  Administered 2012-04-04 – 2012-04-07 (×9): 5000 [IU] via SUBCUTANEOUS
  Filled 2012-04-04 (×13): qty 1

## 2012-04-04 MED ORDER — IPRATROPIUM BROMIDE 0.02 % IN SOLN
0.5000 mg | RESPIRATORY_TRACT | Status: DC
  Administered 2012-04-04 – 2012-04-05 (×10): 0.5 mg via RESPIRATORY_TRACT
  Filled 2012-04-04 (×11): qty 2.5

## 2012-04-04 MED ORDER — NICOTINE 21 MG/24HR TD PT24
21.0000 mg | MEDICATED_PATCH | Freq: Every day | TRANSDERMAL | Status: DC
  Administered 2012-04-04 – 2012-04-07 (×4): 21 mg via TRANSDERMAL
  Filled 2012-04-04 (×4): qty 1

## 2012-04-04 MED ORDER — SODIUM CHLORIDE 0.9 % IJ SOLN
3.0000 mL | Freq: Two times a day (BID) | INTRAMUSCULAR | Status: DC
  Administered 2012-04-04 – 2012-04-07 (×8): 3 mL via INTRAVENOUS

## 2012-04-04 MED ORDER — CHLORHEXIDINE GLUCONATE 0.12 % MT SOLN
15.0000 mL | Freq: Two times a day (BID) | OROMUCOSAL | Status: DC
Start: 2012-04-04 — End: 2012-04-07
  Administered 2012-04-04 – 2012-04-07 (×4): 15 mL via OROMUCOSAL
  Filled 2012-04-04 (×7): qty 15

## 2012-04-04 MED ORDER — ACETAMINOPHEN 325 MG PO TABS: 650.0000 mg | ORAL_TABLET | ORAL | Status: DC | PRN

## 2012-04-04 MED ORDER — INFLUENZA VIRUS VACC SPLIT PF IM SUSP
0.5000 mL | INTRAMUSCULAR | Status: AC
  Administered 2012-04-05: 0.5 mL via INTRAMUSCULAR
  Filled 2012-04-04: qty 0.5

## 2012-04-04 MED ORDER — METHYLPREDNISOLONE SODIUM SUCC 125 MG IJ SOLR
125.0000 mg | Freq: Once | INTRAMUSCULAR | Status: AC
  Administered 2012-04-04: 125 mg via INTRAVENOUS
  Filled 2012-04-04: qty 2

## 2012-04-04 MED ORDER — FUROSEMIDE 10 MG/ML IJ SOLN
40.0000 mg | Freq: Once | INTRAMUSCULAR | Status: AC
  Administered 2012-04-04: 40 mg via INTRAVENOUS
  Filled 2012-04-04: qty 4

## 2012-04-04 MED ORDER — ALBUTEROL SULFATE (5 MG/ML) 0.5% IN NEBU
5.0000 mg | INHALATION_SOLUTION | RESPIRATORY_TRACT | Status: DC
  Administered 2012-04-04 – 2012-04-05 (×10): 5 mg via RESPIRATORY_TRACT
  Filled 2012-04-04 (×2): qty 0.5
  Filled 2012-04-04: qty 1
  Filled 2012-04-04 (×5): qty 0.5
  Filled 2012-04-04: qty 1
  Filled 2012-04-04 (×2): qty 0.5
  Filled 2012-04-04: qty 1
  Filled 2012-04-04 (×4): qty 0.5
  Filled 2012-04-04: qty 1

## 2012-04-04 MED ORDER — HYDRALAZINE HCL 20 MG/ML IJ SOLN: 15.0000 mg | Freq: Four times a day (QID) | INTRAMUSCULAR | Status: DC | PRN

## 2012-04-04 MED ORDER — HYDRALAZINE HCL 25 MG PO TABS
25.0000 mg | ORAL_TABLET | Freq: Once | ORAL | Status: DC
  Filled 2012-04-04: qty 1

## 2012-04-04 MED ORDER — AMLODIPINE BESYLATE 10 MG PO TABS
10.0000 mg | ORAL_TABLET | Freq: Every day | ORAL | Status: DC
  Administered 2012-04-04 – 2012-04-07 (×4): 10 mg via ORAL
  Filled 2012-04-04 (×4): qty 1

## 2012-04-04 MED ORDER — AZITHROMYCIN 250 MG PO TABS
250.0000 mg | ORAL_TABLET | Freq: Once | ORAL | Status: DC
  Filled 2012-04-04: qty 1

## 2012-04-04 MED ORDER — AMLODIPINE BESYLATE 10 MG PO TABS
10.0000 mg | ORAL_TABLET | Freq: Every day | ORAL | Status: DC
  Filled 2012-04-04: qty 1

## 2012-04-04 NOTE — Progress Notes (Signed)
  Echocardiogram 2D Echocardiogram has been performed.  MORFORD, MELISSA 04/04/2012, 10:07 AM

## 2012-04-04 NOTE — H&P (Signed)
Seen and examined.  Discussed with Dr. Barbra Sarks.  Agree with her documentation and management.   [name] is admitted with a one month hx of "cold" and one week of progressive shortness of breath.  Admits to medical non compliance for 1 week "because I was stupid."  Multifactorial SOB.  Issues - which exacerbate one another.: 1. CHF - volume overloaded and we are diuresing.   2. COPD exac.  Still smoking.  She is wheezing 3. Hypertensive urgency.  She had markedly elevated BP on presentation to the ER.    She seems to be responding to treatment.  We will need to emphasize compliance and smoking cessation as we approach DC.

## 2012-04-04 NOTE — ED Provider Notes (Addendum)
History     CSN: [id number]  Arrival date & time 04/03/12  2251   First MD Initiated Contact with Patient 04/03/12 2306      No chief complaint on file.   (Consider location/radiation/quality/duration/timing/severity/associated sxs/prior treatment) HPI Comments: Pt with hx of COPD, HTN - uncontrolled and with non compliance comes in with cc of dib. Pt states that she has been having some dib and "cold" since Jan, however, over the past 3 days her sx have acutely worsened. Today she has started having dib even at rest despite her taking multiple meds. She has a cough - with clear discharge. No fevers, chills. The dib is exertional, and patient also has some PND today. She has mild chest discomfort, mid sternal. She has no known CAD or CHF. She denies any illicit use, admits to smoking.   The history is provided by the patient.    Past Medical History  Diagnosis Date  . Hyperlipidemia   . Migraine headache   . COPD (chronic obstructive pulmonary disease)   . Fibrocystic breast disease   . GERD (gastroesophageal reflux disease)   . Hypertension 04-10-11    had stop taking meds, started back 3 weeks ago, B/P remains elevated  . Sleep apnea 04-10-11    01-2008 sleep study report with chart-doesn't use cpap  . Arthritis 04-10-11    hips, shoulders, back  . Cervical dysplasia or atypia 04-10-11    '93- once dx.-got pregnant-no intervention, then postpartum, no dysplasia found    Past Surgical History  Procedure Laterality Date  . Breast cyst excision      bilateral breast  . Neck surgery  04-10-11    x3- cervical fusion with plating and screws-Dr. [name]  . Back surgery  04-10-11    x5-Lumbar fusion-retained hardware.(Dr. Louanne Skye)  . Anal fistulectomy  04/17/2011    Procedure: FISTULECTOMY ANAL;  Surgeon: Imogene Burn. [name], MD;  Location: [facility];  Service: General;  Laterality: N/A;  Excision of Condyloma Gluteal Cleft     Family History  Problem Relation Age of Onset  . Stroke Father    . Cancer Maternal Aunt     breast    History  Substance Use Topics  . Smoking status: Current Every Day Smoker -- 0.50 packs/day for 30 years    Types: Cigarettes  . Smokeless tobacco: Never Used  . Alcohol Use: Yes     Comment: occ    OB History   Grav Para Term Preterm Abortions TAB SAB Ect Mult Living                  Review of Systems  Constitutional: Positive for activity change.  HENT: Negative for facial swelling and neck pain.   Respiratory: Positive for cough, chest tightness, shortness of breath and wheezing.   Cardiovascular: Negative for chest pain.  Gastrointestinal: Negative for nausea, vomiting, abdominal pain, diarrhea, constipation, blood in stool and abdominal distention.  Genitourinary: Negative for hematuria and difficulty urinating.  Skin: Negative for color change.  Neurological: Negative for speech difficulty.  Hematological: Does not bruise/bleed easily.  Psychiatric/Behavioral: Negative for confusion.    Allergies  Levofloxacin and Methadone hcl  Home Medications   Current Outpatient Rx  Name  Route  Sig  Dispense  Refill  . albuterol (PROVENTIL HFA;VENTOLIN HFA) 108 (90 BASE) MCG/ACT inhaler   Inhalation   Inhale 2 puffs into the lungs every 6 (six) hours as needed. For shortness of breath         .  Pseudoephedrine-APAP-DM (DAYQUIL MULTI-SYMPTOM) 60-650-20 MG/30ML LIQD   Oral   Take 30 mLs by mouth every 6 (six) hours as needed (cold symptoms).         . EXPIRED: ARIPiprazole (ABILIFY) 5 MG tablet   Oral   Take 1 tablet (5 mg total) by mouth daily.   30 tablet   0     Handwritten by Dr. Tammi Klippel in [facility].   [name] Kitchen EXPIRED: nicotine (NICODERM CQ - DOSED IN MG/24 HOURS) 21 mg/24hr patch   Transdermal   Place 1 patch onto the skin daily.   28 patch   3   . EXPIRED: nicotine polacrilex (NICORETTE) 2 MG lozenge   Buccal   Place 1 lozenge (2 mg total) inside cheek as needed for smoking cessation.   100 tablet   0     BP 198/107   Pulse 104  Temp(Src) 99.1 F (37.3 C) (Oral)  Resp 18  SpO2 100%  LMP 04/10/2006  Physical Exam  Nursing note and vitals reviewed. Constitutional: She is oriented to person, place, and time. She appears well-developed.  HENT:  Head: Normocephalic and atraumatic.  Eyes: Conjunctivae and EOM are normal. Pupils are equal, round, and reactive to light.  Neck: Normal range of motion. Neck supple. JVD present.  Cardiovascular: Regular rhythm and normal heart sounds.   No murmur heard. Pulmonary/Chest: She is in respiratory distress. She has no wheezes. She has rales.  Pt is in respiratory distress, tachypneic, unable to complete full sentences and with some retractions. She has bibasilar rales and poor air entry. No wheezing appreciated.  Abdominal: Soft. Bowel sounds are normal. She exhibits no distension. There is no tenderness. There is no rebound and no guarding.  Musculoskeletal: She exhibits no edema.  Neurological: She is alert and oriented to person, place, and time.  Skin: Skin is warm and dry.    ED Course  Procedures (including critical care time)  Labs Reviewed  BASIC METABOLIC PANEL - Abnormal; Notable for the following:    Potassium 3.4 (*)    Glucose, Bld 129 (*)    Creatinine, Ser 1.28 (*)    GFR calc non Af Amer 49 (*)    GFR calc Af Amer 56 (*)    All other components within normal limits  CBC WITH DIFFERENTIAL - Abnormal; Notable for the following:    WBC 14.9 (*)    RBC 5.60 (*)    Hemoglobin 16.3 (*)    MCHC 36.4 (*)    Neutrophils Relative 78 (*)    Neutro Abs 11.6 (*)    All other components within normal limits  PRO B NATRIURETIC PEPTIDE - Abnormal; Notable for the following:    Pro B Natriuretic peptide (BNP) 7222.0 (*)    All other components within normal limits  TROPONIN I  URINALYSIS, ROUTINE W REFLEX MICROSCOPIC   Dg Chest Port 1 View  04/03/2012  *RADIOLOGY REPORT*  Clinical Data: Short of breath, evaluate for pneumonia  PORTABLE CHEST - 1  VIEW  Comparison: Prior chest x-ray 04/10/2011  Findings: Cardiomegaly.  There is pulmonary vascular congestion bordering on mild interstitial edema.  Surgical changes of combined posterior and anterior cervical fusion are incompletely imaged.  No pneumothorax.  IMPRESSION:  Cardiomegaly and pulmonary vascular congestion bordering on mild interstitial edema. Atypical infection could have a similar appearance.   Original Report Authenticated By: Jacqulynn Cadet, M.D.      No diagnosis found.    MDM   Date: 04/04/2012  Rate: 108  Rhythm:  sinus tachycardia  QRS Axis: normal  Intervals: normal  ST/T Wave abnormalities: normal  Conduction Disutrbances:none  Narrative Interpretation:   Old EKG Reviewed: unchanged  Differential diagnosis includes: ACS syndrome COPD exacerbation CHF exacerbation Hypertensive emergency with flash pulm edema Valvular disorder Pericarditis Pericardial effusion Pneumonia Pleural effusion PE Anemia  Pt comes in with cc of DIB. Hx of COPD and HTN. Pt is noted to have BP of 220/120 with MAP around of 150. Based on exam, pt has jvd with some rales and no wheezing - so i suspect this to be a pulm edema, HTN emergency case. There could be an underlying new CHF, but less likely. Pt will be given breathing tx as well, to see if she improves and i will give her clonidine 0.2 mg, instead of betablocker that she takes for BP management acutely  - swaying away from iv meds for now.  1:45 AM Upon reassessment - pt is still in resp distress. BP improved - 170/88 Pt feels  That she is getting tired - so we will get cpap. Xray confirms flash pulm edema and BNP is > 7000. She also has leukocytosis and with a cough and equivocal Xray for infiltrate - we will air on the side of caution given her resp status and cover her for CAP. I am also going to defer the lasix treatment for few minutes (given possible sepsis in the ddx with PNA), and consult for admission.     CRITICAL CARE Performed by: Varney Biles   Total critical care time: 45 minutes  Critical care time was exclusive of separately billable procedures and treating other patients.  Critical care was necessary to treat or prevent imminent or life-threatening deterioration.  Critical care was time spent personally by me on the following activities: development of treatment plan with patient and/or surrogate as well as nursing, discussions with consultants, evaluation of patient's response to treatment, examination of patient, obtaining history from patient or surrogate, ordering and performing treatments and interventions, ordering and review of laboratory studies, ordering and review of radiographic studies, pulse oximetry and re-evaluation of patient's condition.      Varney Biles, MD 04/04/12 [id number]  Varney Biles, MD 04/04/12 [id number]

## 2012-04-04 NOTE — H&P (Signed)
[facility] Admission History and Physical Service Pager: [phone]  Patient name: [name] Medical record number: [id number] Date of birth: 1965-07-08 Age: 47 y.o. Gender: female  Primary Care Layan Zalenski: Garret Reddish, MD  Chief Complaint: SOB  Assessment and Plan: [name] is a 47 y.o. year old female with a history of COPD, OSA, HTN, and tobacco abuse who presents with worsening dyspnea and cough  # Dyspnea likely secondary to COPD and acute CHF/acute pulmonary edema.  BNP elevated at 7222 and chest xray revealed cardiomegaly and pulmonary vascular congestion bordering on mild interstitial edema.  - Will admit to Step down, FPTS, Attending Dr. Andria Frames  COPD exacerbation - Patient has already received continuous Albuterol and IV Solumedrol. - Duonebs Q4 and Albuterol Q2 PRN ordered. - supplemental Oxygen as needed. - Robitussin PRN cough  Pulmonary edema/possible acute CHF - IV Lasix 40 mg now - Strict I/O's and Daily weights - 2D Echo ordered.  # Hypertensive Emergency - 219/112 in the ED with evidence of end organ damage (elevated creatinine).  Likely contributing to acute dyspnea. - HTN now drastically improved (151/96) following PO clonidine. - PRN Hydralazine ordered for SBP >180 or DBP >110. - IV Lasix 40 mg now.  Will continue to monitor closely and add antihypertensives as needed.  # Elevated creatinine - Patient had normal creatinine on 04/10/2011 - Creatinine elevated at 1.28 on admission. - Unclear if this is secondary to long standing uncontrolled hypertension or if this represents AKI - Will monitor closely during admission via daily metabolic panels  # OSA - CPAP ordered  # Tobacco abuse - Nicotine patch continued  FEN/GI: Saline Lock IV.  Heart healthy diet. Prophylaxis: Heparin SQ Disposition: Pending clinical improvement Code Status: Full code.  History of Present Illness:  [name] is a 47 y.o. year  old female with a history of COPD, OSA, HTN, and tobacco abuse presents to the ED with SOB.  Patient reports that she has been having worsening SOB over the past few days. She states that it acutely worsened today and she felt that she could not catch her breath despite frequent albuterol use.  SOB is worse with exertion but now at rest as well.  She also reports that she has had a "cold" since January.  Patient is noncompliant with medications and has not taken any medications other than albuterol and Dayquil in quite some time.  In the ED, patient found to be hypertensive (219/112) and acutely SOB.  Given history of COPD, patient was treated with IV steroids and albuterol.  HTN was treated with PO Clonidine.  ROS: Denies orthopnea.  Reports PND. Reports associated cough.  Denies chest pain, lower extremity edema, abdominal pain, nausea, vomiting.    Patient Active Problem List  Diagnosis  . HYPERLIPIDEMIA  . OBESITY, NOS  . TOBACCO DEPENDENCE  . DEPRESSIVE DISORDER, NOS  . OBSTRUCTIVE SLEEP APNEA  . NEUROGENIC BLADDER  . MIGRAINE HEADACHE  . ESSENTIAL HYPERTENSION  . COPD  . FIBROCYSTIC BREAST DISEASE  . DYSFUNCTIONAL UTERINE BLEEDING  . ALOPECIA  . GERD  . Unspecified episodic mood disorder  . Skin lesion  . Allergic rhinitis  . Condyloma - gluteal cleft   Past Medical History: Past Medical History  Diagnosis Date  . Hyperlipidemia   . Migraine headache   . COPD (chronic obstructive pulmonary disease)   . Fibrocystic breast disease   . GERD (gastroesophageal reflux disease)   . Hypertension 04-10-11  had stop taking meds, started back 3 weeks ago, B/P remains elevated  . Sleep apnea 04-10-11    01-2008 sleep study report with chart-doesn't use cpap  . Arthritis 04-10-11    hips, shoulders, back  . Cervical dysplasia or atypia 04-10-11    '93- once dx.-got pregnant-no intervention, then postpartum, no dysplasia found   Past Surgical History: Past Surgical History   Procedure Laterality Date  . Breast cyst excision      bilateral breast  . Neck surgery  04-10-11    x3- cervical fusion with plating and screws-Dr. [name]  . Back surgery  04-10-11    x5-Lumbar fusion-retained hardware.(Dr. Louanne Skye)  . Anal fistulectomy  04/17/2011    Procedure: FISTULECTOMY ANAL;  Surgeon: Imogene Burn. [name], MD;  Location: [facility];  Service: General;  Laterality: N/A;  Excision of Condyloma Gluteal Cleft    Social History: History  Substance Use Topics  . Smoking status: Current Every Day Smoker -- 0.50 packs/day for 30 years    Types: Cigarettes  . Smokeless tobacco: Never Used  . Alcohol Use: Yes     Comment: occ   For any additional social history documentation, please refer to relevant sections of EMR.  Family History: Family History  Problem Relation Age of Onset  . Stroke Father   . Cancer Maternal Aunt     breast  HTN - mother  Allergies: Allergies  Allergen Reactions  . Levofloxacin Itching  . Methadone Hcl     REACTION: "blacked out" in 1990s   No current facility-administered medications on file prior to encounter.   Current Outpatient Prescriptions on File Prior to Encounter  Medication Sig Dispense Refill  . albuterol (PROVENTIL HFA;VENTOLIN HFA) 108 (90 BASE) MCG/ACT inhaler Inhale 2 puffs into the lungs every 6 (six) hours as needed. For shortness of breath      . ARIPiprazole (ABILIFY) 5 MG tablet Take 1 tablet (5 mg total) by mouth daily.  30 tablet  0  . nicotine (NICODERM CQ - DOSED IN MG/24 HOURS) 21 mg/24hr patch Place 1 patch onto the skin daily.  28 patch  3  . nicotine polacrilex (NICORETTE) 2 MG lozenge Place 1 lozenge (2 mg total) inside cheek as needed for smoking cessation.  100 tablet  0  . [DISCONTINUED] sucralfate (CARAFATE) 1 G tablet Take 1 tablet (1 g total) by mouth daily.  30 tablet  6   Review Of Systems: Per HPI. Otherwise 12 point review of systems was performed and was unremarkable.  Physical Exam: BP 198/107  Pulse  104  Temp(Src) 99.1 F (37.3 C) (Oral)  Resp 18  SpO2 100%  LMP 04/10/2006 Exam: General: obese lady sitting up in bed. Appears in mild respiratory distress on [place]  HEENT: NCAT. Sclera anicteric. Cardiovascular: Tachycardic. Regular rhythm. No murmurs, rubs, or gallops. Respiratory: Bibasilar rales noted.  No wheezing or rhonchi noted. Abdomen: obese. Soft, nontender, nondistended.  Extremities: warm, well perfused.  No LE edema appreciated. Skin: warm, dry, intact. Neuro: No focal deficits.  Labs and Imaging: CBC BMET   Recent Labs Lab 04/03/12 2333  WBC 14.9*  HGB 16.3*  HCT 44.8  PLT 297    Recent Labs Lab 04/03/12 2333  NA 143  K 3.4*  CL 105  CO2 25  BUN 9  CREATININE 1.28*  GLUCOSE 129*  CALCIUM 9.3     Cardiac Panel (last 3 results)  Recent Labs  04/03/12 2333  TROPONINI <0.30   BNP    Component Value  Date/Time   PROBNP 7222.0* 04/03/2012 2333   Dg Chest Port 1 View 04/03/2012  *RADIOLOGY REPORT*  Clinical Data: Short of breath, evaluate for pneumonia  PORTABLE CHEST - 1 VIEW  Comparison: Prior chest x-ray 04/10/2011  Findings: Cardiomegaly.  There is pulmonary vascular congestion bordering on mild interstitial edema.  Surgical changes of combined posterior and anterior cervical fusion are incompletely imaged.  No pneumothorax.  IMPRESSION:  Cardiomegaly and pulmonary vascular congestion bordering on mild interstitial edema. Atypical infection could have a similar appearance.   Lacinda Axon, [place], DO 04/04/2012, 1:22 AM  PGY3 Addendum to PGY1 H & P : I saw and examined this patient with Dr. Lacinda Axon and agree with the above not with appropriate additions.   CHAMBERLAIN,RACHEL 04/04/2012

## 2012-04-04 NOTE — Progress Notes (Signed)
MD paged for continued elevated BP even after receiving Hydrazaline 10 mg IVP.  Dr Lacinda Axon returned page and current VS provided.

## 2012-04-04 NOTE — Progress Notes (Signed)
Placed pt. On cpap as per order. Pt.is on auto titrate, min 9, max 18. Pt. Is wearing a full face mask and is tolerating well at this time. RT to monitor.

## 2012-04-04 NOTE — ED Notes (Signed)
Paged respiratory for cpap

## 2012-04-05 DIAGNOSIS — J449 Chronic obstructive pulmonary disease, unspecified: Secondary | ICD-10-CM

## 2012-04-05 LAB — BASIC METABOLIC PANEL
Chloride: 103 mEq/L (ref 96–112)
GFR calc Af Amer: 48 mL/min — ABNORMAL LOW (ref >90)
GFR calc non Af Amer: 41 mL/min — ABNORMAL LOW (ref >90)
Potassium: 3.3 mEq/L — ABNORMAL LOW (ref 3.5–5.1)
Sodium: 141 mEq/L (ref 135–145)

## 2012-04-05 MED ORDER — POTASSIUM CHLORIDE CRYS ER 20 MEQ PO TBCR
40.0000 meq | EXTENDED_RELEASE_TABLET | Freq: Once | ORAL | Status: AC
  Administered 2012-04-05: 40 meq via ORAL
  Filled 2012-04-05: qty 2

## 2012-04-05 MED ORDER — FUROSEMIDE 40 MG PO TABS
40.0000 mg | ORAL_TABLET | Freq: Every day | ORAL | Status: DC
  Administered 2012-04-05 – 2012-04-07 (×3): 40 mg via ORAL
  Filled 2012-04-05 (×3): qty 1

## 2012-04-05 MED ORDER — IPRATROPIUM BROMIDE 0.02 % IN SOLN
0.5000 mg | Freq: Four times a day (QID) | RESPIRATORY_TRACT | Status: DC
  Administered 2012-04-06 – 2012-04-07 (×5): 0.5 mg via RESPIRATORY_TRACT
  Filled 2012-04-05 (×5): qty 2.5

## 2012-04-05 MED ORDER — POTASSIUM CHLORIDE CRYS ER 20 MEQ PO TBCR: 30.0000 meq | EXTENDED_RELEASE_TABLET | Freq: Once | ORAL | Status: DC

## 2012-04-05 MED ORDER — ALBUTEROL SULFATE (5 MG/ML) 0.5% IN NEBU
5.0000 mg | INHALATION_SOLUTION | Freq: Four times a day (QID) | RESPIRATORY_TRACT | Status: DC
  Administered 2012-04-06 – 2012-04-07 (×5): 5 mg via RESPIRATORY_TRACT
  Filled 2012-04-05 (×7): qty 0.5
  Filled 2012-04-05: qty 1
  Filled 2012-04-05: qty 0.5

## 2012-04-05 MED ORDER — FUROSEMIDE 10 MG/ML IJ SOLN
40.0000 mg | Freq: Three times a day (TID) | INTRAMUSCULAR | Status: DC
  Administered 2012-04-05: 40 mg via INTRAVENOUS
  Filled 2012-04-05: qty 4

## 2012-04-05 NOTE — Progress Notes (Signed)
Report to Charlett Nose, RN 3W; pt transferred to floor with tele and RN

## 2012-04-05 NOTE — Progress Notes (Signed)
. PGY-2 Daily Progress Note Family Medicine Teaching Service Brayton Mars. Melanee Spry, MD Service Pager: [phone]   Subjective: Breathing more comfortably. Oxygen slowly being weaned down this AM now at 3L as patient not on oxygen at home.   Objective:  Temp:  [97.3 F (36.3 C)-98.1 F (36.7 C)] 97.3 F (36.3 C) (03/09 1123) Pulse Rate:  [79-109] 79 (03/09 0745) Resp:  [16-36] 16 (03/09 1123) BP: (138-177)/(54-139) 139/65 mmHg (03/09 1123) SpO2:  [91 %-100 %] 98 % (03/09 1125) FiO2 (%):  [40 %] 40 % (03/09 0413) Weight:  [319 lb 10.7 oz (145 kg)] 319 lb 10.7 oz (145 kg) (03/09 0500)  Intake/Output Summary (Last 24 hours) at 04/05/12 1203 Last data filed at 04/05/12 1000  Gross per 24 hour  Intake   1050 ml  Output    810 ml  Net    240 ml   Gen:  NAD, obese, resting in bed, RR increases with sitting up in bed to mid 20s but otherwise comfortable HEENT: moist mucous membranes CV: Regular rate and rhythm when sitting, tachycardic to low 100s when sitting up, no murmurs rubs or gallops PULM: bibasilar rales persist. No wheezing or rhonchi.  ABD: soft/nontender/nondistended/normal bowel sounds EXT: trace edema Neuro: Alert and oriented x3, grossly normal  Labs and imaging:   CBC  Recent Labs Lab 04/03/12 2333  WBC 14.9*  HGB 16.3*  HCT 44.8  PLT 297   BMET/CMET  Recent Labs Lab 04/03/12 2333 04/05/12 0520  NA 143 141  K 3.4* 3.3*  CL 105 103  CO2 25 28  BUN 9 19  CREATININE 1.28* 1.47*  CALCIUM 9.3 9.2  GLUCOSE 129* 104*    Dg Chest Port 1 View  04/03/2012  *RADIOLOGY REPORT*  Clinical Data: Short of breath, evaluate for pneumonia  PORTABLE CHEST - 1 VIEW  Comparison: Prior chest x-ray 04/10/2011  Findings: Cardiomegaly.  There is pulmonary vascular congestion bordering on mild interstitial edema.  Surgical changes of combined posterior and anterior cervical fusion are incompletely imaged.  No pneumothorax.  IMPRESSION:  Cardiomegaly and pulmonary vascular  congestion bordering on mild interstitial edema. Atypical infection could have a similar appearance.   Original Report Authenticated By: Jacqulynn Cadet, M.D.     Scheduled: . ipratropium  0.5 mg Nebulization Q4H   And  . albuterol  5 mg Nebulization Q4H  . amLODipine  10 mg Oral Daily  . antiseptic oral rinse  15 mL Mouth Rinse q12n4p  . chlorhexidine  15 mL Mouth Rinse BID  . furosemide  40 mg Oral Daily  . heparin  5,000 Units Subcutaneous Q8H  . nicotine  21 mg Transdermal Daily  . potassium chloride  30 mEq Oral Once  . sodium chloride  3 mL Intravenous Q12H   Continuous:  [id number], albuterol, hydrALAZINE, nitroGLYCERIN, ondansetron (ZOFRAN) IV, sodium chloride  Assessment/Plan  [name] is a 47 y.o. year old female with a history of COPD, OSA, HTN, and tobacco abuse who presents with worsening dyspnea and cough secondary to new onset CHF and component of COPD  # Pulmonary edema with concern CHF- BNP elevated at 7222 and chest xray revealed cardiomegaly and pulmonary vascular congestion bordering on mild interstitial edema.  -follow up 2-3 echo results -patient net positive 850 mL yesterday. redose lasix 40 IV x1 then transition to PO daily. Caution given AKI.  - Strict I/O's and Daily weights  COPD exacerbation-CAT and solumedrol at admission - Improving.  - Duonebs Q4 and Albuterol Q2  PRN ordered. Consider transition to q6/q2 in next 24 hours.  - No prednisone or antibiotics ordered.  - supplemental Oxygen as needed. Continue to wean oxygen (3L currently) - prn robitussin for cough  # AKI/Elevated creatinine - Patient had normal creatinine on 04/10/2011 - Creatinine elevated at 1.28 on admission. Increased to 1.47 today. Continue to trend creatinine - patient may have baseline CKD stage III, but may also have AKI  # Hypertension  -most BP elevated but most recently well controlled. Continue amlodipine.  -will need to consider transition to ace/beta  blocker if CHF noted on Echo but creatinine may not allow transition.   #Hypertensive Emergency-resolved (PO clonidine given in ED). PRN hydralazine still ordered.  # OSA-CPAP ordered # Tobacco abuse-Nicotine patch continued. Needs counseling about cessation.  # Tachycardia-intermittent. Would consider EKG if persistent but appears to be sinus tach on monitor related to exertion.  FEN/GI: Saline Lock IV.  Heart healthy diet. Hypokalemia-repleted with 40 meq x2 given repeat doses lasix.  Prophylaxis: Heparin SQ Disposition: Pending clinical improvement. will transfer out of stepdown once beds available. Consider PT/OT if mobility doesn't progress as dyspnea improves. Code Status: Full code.  Garret Reddish, MD PGY2, Family Medicine Teaching Service [phone]

## 2012-04-05 NOTE — Progress Notes (Signed)
Seen and examined.  She is improving but not yet at baseline.  Agree with Dr. Yong Channel.  OK to transfer to the floor.

## 2012-04-06 ENCOUNTER — Encounter (HOSPITAL_COMMUNITY)
Admission: EM | Disposition: A | Discharge status: Home / Self Care | Payer: Self-pay | Source: Home / Self Care | Attending: Family Medicine

## 2012-04-06 ENCOUNTER — Encounter (HOSPITAL_COMMUNITY): Payer: Self-pay | Admitting: Gastroenterology

## 2012-04-06 HISTORY — PX: DOPPLER ECHOCARDIOGRAPHY: SHX263

## 2012-04-06 HISTORY — PX: TEE WITHOUT CARDIOVERSION: SHX5443

## 2012-04-06 LAB — BASIC METABOLIC PANEL
Chloride: 102 mEq/L (ref 96–112)
GFR calc Af Amer: 53 mL/min — ABNORMAL LOW (ref >90)
Potassium: 3.8 mEq/L (ref 3.5–5.1)

## 2012-04-06 LAB — CBC
Hemoglobin: 14 g/dL (ref 12.0–15.0)
RBC: 4.84 MIL/uL (ref 3.87–5.11)
WBC: 10 10*3/uL (ref 4.0–10.5)

## 2012-04-06 LAB — MAGNESIUM: Magnesium: 2.1 mg/dL (ref 1.5–2.5)

## 2012-04-06 SURGERY — ECHOCARDIOGRAM, TRANSESOPHAGEAL
Anesthesia: Moderate Sedation

## 2012-04-06 MED ORDER — BUTAMBEN-TETRACAINE-BENZOCAINE 2-2-14 % EX AERO
INHALATION_SPRAY | CUTANEOUS | Status: DC | PRN
  Administered 2012-04-06: 2 via TOPICAL

## 2012-04-06 MED ORDER — FENTANYL CITRATE 0.05 MG/ML IJ SOLN
INTRAMUSCULAR | Status: AC
  Filled 2012-04-06: qty 4

## 2012-04-06 MED ORDER — MIDAZOLAM HCL 5 MG/ML IJ SOLN
INTRAMUSCULAR | Status: AC
  Filled 2012-04-06: qty 2

## 2012-04-06 MED ORDER — DIPHENHYDRAMINE HCL 50 MG/ML IJ SOLN
INTRAMUSCULAR | Status: AC
  Filled 2012-04-06: qty 1

## 2012-04-06 MED ORDER — FENTANYL CITRATE 0.05 MG/ML IJ SOLN
INTRAMUSCULAR | Status: DC | PRN
  Administered 2012-04-06 (×2): 25 ug via INTRAVENOUS

## 2012-04-06 MED ORDER — POTASSIUM CHLORIDE CRYS ER 20 MEQ PO TBCR: 40.0000 meq | EXTENDED_RELEASE_TABLET | Freq: Once | ORAL | Status: AC

## 2012-04-06 MED ORDER — MIDAZOLAM HCL 10 MG/2ML IJ SOLN
INTRAMUSCULAR | Status: DC | PRN
  Administered 2012-04-06 (×3): 1 mg via INTRAVENOUS

## 2012-04-06 MED ORDER — LIDOCAINE VISCOUS 2 % MT SOLN
OROMUCOSAL | Status: AC
  Filled 2012-04-06: qty 15

## 2012-04-06 MED ORDER — LIDOCAINE VISCOUS 2 % MT SOLN
OROMUCOSAL | Status: DC | PRN
  Administered 2012-04-06: 10 mL via OROMUCOSAL

## 2012-04-06 NOTE — Consult Note (Signed)
Pt. Seen and examined. Agree with the NP/PA-C note as written. 47 yo female with signs of significant hypertrophy on 2D echo, possibly due to hypertension or hypertrophic cardiomyopathy. 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 There is significant diastolic dysfunction and the presentation was of heart failure. Discussed risk/benefit of the procedure with her and she agrees to proceed with TEE to further evaluate today.   [name], MD, [facility] Attending Cardiologist [facility] [place]

## 2012-04-06 NOTE — Progress Notes (Signed)
Patient ID: [name], female   DOB: Jul 06, 1965, 47 y.o.   MRN: [id number]  PGY-1 Daily Progress Note Family Medicine Teaching Service Florence Canner MS4 Service Pager: [phone]   Subjective: Breathing more comfortable overnight. Slept well with CPAP bed slightly elevated. Foley out this AM. Patient walked to bathroom with mild SOB this morning, less than normal. Moderate dry cough still present with no changes. No CP, dizziness, visual changes, f/c, h/a, abdominal discomfort, urinary symptoms. ROS: thirsty.   Objective:  VITALS Temp:  [97.6 F (36.4 C)-98.5 F (36.9 C)] 97.6 F (36.4 C) (03/10 0547) Pulse Rate:  [85-100] 85 (03/10 0547) Resp:  [17-18] 18 (03/10 0547) BP: (138-168)/(66-83) 156/80 mmHg (03/10 0547) SpO2:  [97 %-100 %] 97 % (03/10 0931) Weight:  [330 lb 14.6 oz (150.1 kg)] 330 lb 14.6 oz (150.1 kg) (03/10 0600)  In/Out  Intake/Output Summary (Last 24 hours) at 04/06/12 1223 Last data filed at 04/06/12 1059  Gross per 24 hour  Intake    300 ml  Output   1350 ml  Net  -1050 ml    Physical Exam: Gen:  NAD HEENT: moist mucous membranes CV: Regular rate and rhythm, no murmurs rubs or gallops PULM: clear to auscultation bilaterally. No wheezes/rales/rhonchi. No increased work of breathing.  ABD: soft/nontender/nondistended EXT: Trace pitting edema LE to mid shin bilaterally Neuro: Alert and oriented x3  MEDS Scheduled Meds: . ipratropium  0.5 mg Nebulization Q6H   And  . albuterol  5 mg Nebulization Q6H  . amLODipine  10 mg Oral Daily  . antiseptic oral rinse  15 mL Mouth Rinse q12n4p  . chlorhexidine  15 mL Mouth Rinse BID  . furosemide  40 mg Oral Daily  . heparin  5,000 Units Subcutaneous Q8H  . nicotine  21 mg Transdermal Daily  . potassium chloride  30 mEq Oral Once  . potassium chloride  40 mEq Oral Once  . sodium chloride  3 mL Intravenous Q12H   Continuous Infusions:  PRN Meds:.acetaminophen, albuterol, hydrALAZINE, nitroGLYCERIN, ondansetron  (ZOFRAN) IV, sodium chloride  Labs and imaging:   CBC  Recent Labs Lab 04/03/12 2333 04/06/12 1050  WBC 14.9* 10.0  HGB 16.3* 14.0  HCT 44.8 39.3  PLT 297 325   BMET/CMET  Recent Labs Lab 04/03/12 2333 04/05/12 0520 04/06/12 1050  NA 143 141 140  K 3.4* 3.3* 3.8  CL 105 103 102  CO2 25 28 30   BUN 9 19 19   CREATININE 1.28* 1.47* 1.34*  CALCIUM 9.3 9.2 9.2  GLUCOSE 129* 104* 88   Results for orders placed during the hospital encounter of 04/03/12 (from the past 24 hour(s))  BASIC METABOLIC PANEL     Status: Abnormal   Collection Time    04/06/12 10:50 AM      Result Value Range   Sodium 140  135 - 145 mEq/L   Potassium 3.8  3.5 - 5.1 mEq/L   Chloride 102  96 - 112 mEq/L   CO2 30  19 - 32 mEq/L   Glucose, Bld 88  70 - 99 mg/dL   BUN 19  6 - 23 mg/dL   Creatinine, Ser 1.34 (*) 0.50 - 1.10 mg/dL   Calcium 9.2  8.4 - 10.5 mg/dL   GFR calc non Af Amer 46 (*) >90 mL/min   GFR calc Af Amer 53 (*) >90 mL/min  MAGNESIUM     Status: None   Collection Time    04/06/12 10:50 AM  Result Value Range   Magnesium 2.1  1.5 - 2.5 mg/dL  CBC     Status: None   Collection Time    04/06/12 10:50 AM      Result Value Range   WBC 10.0  4.0 - 10.5 K/uL   RBC 4.84  3.87 - 5.11 MIL/uL   Hemoglobin 14.0  12.0 - 15.0 g/dL   HCT 39.3  36.0 - 46.0 %   MCV 81.2  78.0 - 100.0 fL   MCH 28.9  26.0 - 34.0 pg   MCHC 35.6  30.0 - 36.0 g/dL   RDW 14.1  11.5 - 15.5 %   Platelets 325  150 - 400 K/uL   No results found.   Assessment  SHERRINA BULLUCK is a 47 y.o. year old female with a history of COPD, OSA, HTN, and tobacco abuse who presents with worsening dyspnea and cough secondary to new onset CHF and component of COPD  # Pulmonary edema with concern CHF- BNP elevated at 7222 and chest xray revealed cardiomegaly and pulmonary vascular congestion bordering on mild interstitial edema.  - follow up 2-D echo results 3/10 - patient net negative 1.5L yesterday.  - lasix 40mg  PO once  daily - potassium 59mEq PO once 3/10 - Strict I/O's and Daily weights - Risk stratify with TSH, A1C, Lipids  COPD exacerbation-CAT and solumedrol at admission;  - Improving.  - Duonebs Q6 and Albuterol Q2 PRN ordered.  - No prednisone or antibiotics ordered. Afebrile, WBC normal 3/10.  - supplemental Oxygen as needed. Continue to wean: 3L 3/10; 5L 3/8 - prn robitussin for cough  # AKI/Elevated creatinine - Patient had normal creatinine on 04/10/2011 - Creatinine elevated at 1.28 on admission. Increased to 1.47 3/9; 1.34 3/10. Continue to trend creatinine - patient may have baseline CKD stage III, but may also have AKI  # Hypertension  -BP elevated but most recently well controlled. Continue amlodipine 10mg  PO once daily  -will need to consider transition to ace/beta blocker if CHF noted on Echo but creatinine may not allow transition.   #Hypertensive Emergency-resolved (PO clonidine and hydralizine given in ED and first night). PRN hydralazine still ordered.  # OSA-CPAP ordered # Tobacco abuse-Nicotine patch continued. Needs counseling about cessation.  # Tachycardia-intermittent. Would consider EKG if persistent but appears to be sinus tach on monitor related to exertion.  FEN/GI: Saline Lock IV.  Heart healthy diet. Hypokalemia-repleted with 40 meq x2 given repeat doses lasix. Potassium 30mg  PO 3/10 Prophylaxis: Heparin SQ Disposition: Pending clinical improvement.  Transferred to floor 3/9  Consider PT/OT if mobility doesn't progress as dyspnea improves. Code Status: Full code.  Florence Canner MS4   PGY-2 Addendum Patient seen and evaluated. Agree with MS4 note above.  Pt doing well this morning. No complaints  O:  Gen: Sitting up in bed, talking on phone HEENT: [place] in place. MMM Cardio: RRR, no murmurs appreciated Pulm: Ok effort. No wheezes or crackles Abd: Obese  A/P: - TEE today to characterize possible vegetation vs. Mass - Continue to diurese - Breathing tx as needed  for COPD - Monitor BP as it was very elevated. May need to reconsider outpatient meds - Creat elevated; avoid fluids in setting of CHF. Monitor with daily Bmet  [employer] [place], M.D. 04/06/2012 2:34 PM

## 2012-04-06 NOTE — CV Procedure (Signed)
[facility]  TRANSESOPHAGEAL ECHOCARDIOGRAM (TEE) NOTE   INDICATIONS: Mitral valve mass  PROCEDURE:   Informed consent was obtained prior to the procedure. The risks, benefits and alternatives for the procedure were discussed and the patient comprehended these risks.  Risks include, but are not limited to, cough, sore throat, vomiting, nausea, somnolence, esophageal and stomach trauma or perforation, bleeding, low blood pressure, aspiration, pneumonia, infection, trauma to the teeth and death.    After a procedural time-out, the patient was given 3 mg versed and 50 mcg fentanyl for moderate sedation.  The oropharynx was anesthetized10 cc of topical 1% viscous lidocaine and 2 cetacaine sprays.  The transesophageal probe was inserted in the esophagus and stomach without difficulty and multiple views were obtained.  The patient was kept under observation until the patient left the procedure room.  The patient left the procedure room in stable condition.   Agitated microbubble saline contrast was not administered.  COMPLICATIONS:    There were no immediate complications.  Findings:  1. LEFT VENTRICLE: The left ventricular wall thickness is severely hypertrophied.  The left ventricular cavity is normal in size. Wall motion is normal.  LVEF is 55-60%.  2. RIGHT VENTRICLE:  The right ventricle is normal in structure and function without any thrombus or masses.    3. LEFT ATRIUM:  The left atrium is dilated in size without any thrombus or masses.  There is not spontaneous echo contrast ("smoke") in the left atrium consistent with a low flow state.  4. LEFT ATRIAL APPENDAGE:  The left atrial appendage is free of any thrombus or masses. The appendage has single lobes. Pulse doppler indicates moderate flow in the appendage.  5. ATRIAL SEPTUM:  The atrial septum appears intact and is free of thrombus and/or masses.  There is no evidence for interatrial shunting by color  doppler and saline microbubble.  6. RIGHT ATRIUM:  The right atrium is normal in size and function without any thrombus or masses.  7. MITRAL VALVE:  The mitral valve is normal in structure and function with trace regurgitation.  There were no vegetations or stenosis. The leaflets were mildly thickened, but no masses were noted on either mitral leaflet.  8. AORTIC VALVE:  The aortic valve is normal in structure and function with no regurgitation.  There were no vegetations or stenosis  9. TRICUSPID VALVE:  The tricuspid valve is normal in structure and function with trace regurgitation.  There were no vegetations or stenosis  10.  PULMONIC VALVE:  The tricuspid valve is normal in structure and function with no regurgitation.  There were no vegetations or stenosis.   AORTIC ARCH, ASCENDING AND DESCENDING AORTA:  The aorta was not well visualized.  IMPRESSION:   1. No evidence for mitral valve mass - mild leaflet thickening with trace MR. 2. Severe concentric LVH - dilated LA, signs of hypertensive heart disease.  RECOMMENDATIONS:    1. Strict blood pressure control and emphasizing compliance with cardiac medications. ACE-I or ARB is recommended if renal function permits to help prevent adverse remodeling.  Emphasized need for compliance with CPAP at home as well.  Time Spent Directly with the Patient:  45 minutes   [name], MD, [facility] Attending Cardiologist [facility] [place]  04/06/2012, 2:33 PM

## 2012-04-06 NOTE — Progress Notes (Signed)
FMTS Attending Note Patient seen and examined by me, discussed with resident team and medical student Oran Rein, and I agree with resident team's assess/plan for today.  Patient is going for TEE to evaluate abnormalities on her 2D ECHO.  WIll continue to watch her Cr and I/Os; risk-stratify as mentioned in medical student note.  Dalbert Mayotte, MD

## 2012-04-06 NOTE — Discharge Summary (Addendum)
Physician Discharge Summary  Patient ID: [name] MRN: [id number] DOB: 31-Mar-1965 Age: 47 y.o.  Admit date: 04/03/2012 Discharge date: 04/07/2012 Admitting Physician: Zigmund Gottron, MD  PCP: Garret Reddish, MD of [place]  Consultants: [facility] for new diagnosis CHF     Discharge Diagnosis: Principal Problem:   Acute on chronic diastolic heart failure Active Problems:   TOBACCO DEPENDENCE   HYPERLIPIDEMIA   OBSTRUCTIVE SLEEP APNEA   ESSENTIAL HYPERTENSION   Hypertensive emergency   COPD exacerbation   Pulmonary edema    Hospital Course [name] is a 47 y.o. year old female with a history of COPD, OSA, HTN, and tobacco abuse who presented with worsening dyspnea and cough in acute respiratory failure (required CPAP in ED and unable to speak in full sentences)  secondary to new onset CHF and component of COPD. She had stopped taking her HTN medications for about 2 months and presented in hypertensive crisis with resulting pulmonary edema and AKI.  Over the course of her stay, she was gently diuresed with lasix resulting in loss of 4L of fluid, given nightly CPAP, and blood pressure control.  This resulted in marked improvement in clinical symptoms.  Her kidney function was monitored daily with a peak of 1.47 trending down to 1.28 on day of discharge.  She received a TTE with poor visualization of the mitral valve so a TEE was ordered.  This showed EF 55-60%, severe concentric LVH, and mild mitral valve thickening with mild MR.  No clots were visualized.  Her initial blood pressures of 190s/100s were initially managed with clonidine and hydralazine in addition to home medication of 10mg  amlodipine.  This reduced her pressures to 140s/80s initially. Systolic Blood pressure varied over coming days up to 190 but as low as 140. Blood pressure medications were titrated up accordingly.   With improvement in her clinical symptoms and kidney  function, she will be sent home on amlodipine, lisinopril, lasix for her heart disease and prn albuterol for COPD symptoms with close follow up with [facility] and [facility].  She will likely need additional blood pressure control as well as a statin for elevated LDL in the setting of HTN and CHF. She will need to continue using her home CPAP, recheck of kidney function, tobacco counseling, and medication management.     Problem List 1. CHF 2. Hyptertension 3. COPD 4. OSA 5. Tobacco dependence 6. Hyperlipidemia 7. Obesity 8. Depression  9. Acute respiratory distress    Discharge PE BP 167/84  Pulse 87  Temp(Src) 98.6 F (37 C) (Oral)  Resp 18  Ht 5\' 10"  (1.778 m)  Wt 335 lb 1.6 oz (152 kg)  BMI 48.08 kg/m2  SpO2 98%  LMP 04/10/2006 See progress note dated 04/07/12    Procedures/Imaging:  Dg Chest Port 1 View  04/03/2012  *RADIOLOGY REPORT*  Clinical Data: Short of breath, evaluate for pneumonia  PORTABLE CHEST - 1 VIEW  Comparison: Prior chest x-ray 04/10/2011  Findings: Cardiomegaly.  There is pulmonary vascular congestion bordering on mild interstitial edema.  Surgical changes of combined posterior and anterior cervical fusion are incompletely imaged.  No pneumothorax.  IMPRESSION:  Cardiomegaly and pulmonary vascular congestion bordering on mild interstitial edema. Atypical infection could have a similar appearance.   Original Report Authenticated By: Jacqulynn Cadet, M.D.    TEE 04/06/12 Study Conclusions  - Left ventricle: Moderate to severe concentric LVH. Systolic function was normal. The estimated ejection fraction was in  the range of 55% to 60%. - Aortic valve: No evidence of vegetation. - Mitral valve: Mild regurgitation. No mass noted on the mitral leaflets. - Left atrium: The atrium was dilated. No evidence of thrombus in the atrial cavity or appendage. - Right atrium: No evidence of thrombus in the atrial cavity or appendage. - Atrial  septum: No defect or patent foramen ovale was identified. - Pulmonic valve: No evidence of vegetation. - Pericardium, extracardiac: There was no pericardial effusion.    Labs  CBC  Recent Labs Lab 04/03/12 2333 04/06/12 1050 04/07/12 0425  WBC 14.9* 10.0 11.6*  HGB 16.3* 14.0 14.2  HCT 44.8 39.3 39.4  PLT 297 325 322   BMET  Recent Labs Lab 04/05/12 0520 04/06/12 1050 04/07/12 0425  NA 141 140 139  K 3.3* 3.8 3.6  CL 103 102 102  CO2 28 30 28   BUN 19 19 17   CREATININE 1.47* 1.34* 1.28*  CALCIUM 9.2 9.2 8.9  GLUCOSE 104* 88 87   HEMOGLOBIN A1C     Status: None   Collection Time    04/06/12  3:38 PM      Result Value Range   Hemoglobin A1C 5.5  <5.7 %  TSH     Status: None   Collection Time    04/06/12  3:38 PM      Result Value Range   TSH 0.457  0.350 - 4.500 uIU/mL  LIPID PANEL     Status: Abnormal   Collection Time    04/07/12  4:25 AM      Result Value Range   Cholesterol 192  0 - 200 mg/dL   Triglycerides 133  <150 mg/dL   HDL 50  >39 mg/dL   Total CHOL/HDL Ratio 3.8     VLDL 27  0 - 40 mg/dL   LDL Cholesterol 115 (*) 0 - 99 mg/dL       Patient condition at time of discharge/disposition: stable  Disposition-home   Follow up issues: 1. HTN--continued amlodipine 10mg , started lisinopril 10mg , lasix 20mg , consider combination agent and/or beta blocker.  2. Hyperlipidemia--consider starting statin 3. New diagnosis CHF--education 4. Tobacco Dependence counseling  Discharge follow up:       Future Appointments Provider Department Dept Phone   04/14/2012 2:30 PM Marin Olp, MD [place]    [facility] will also call patient to schedule follow up appointment      Discharge Instructions: Please refer to Patient Instructions section of EMR for full details.  Patient was counseled important signs and symptoms that should prompt return to medical care, changes in medications,  dietary instructions, activity restrictions, and follow up appointments.  Significant instructions noted below:    Discharge Medications   Medication List    STOP taking these medications       DAYQUIL MULTI-SYMPTOM 60-650-20 MG/30ML Liqd  Generic drug:  Pseudoephedrine-APAP-DM      TAKE these medications       albuterol 108 (90 BASE) MCG/ACT inhaler  Commonly known as:  PROVENTIL HFA;VENTOLIN HFA  Inhale 2 puffs into the lungs every 6 (six) hours as needed. For shortness of breath     amLODipine 10 MG tablet  Commonly known as:  NORVASC  Take 1 tablet (10 mg total) by mouth daily. For blood pressure.     ARIPiprazole 5 MG tablet  Commonly known as:  ABILIFY  Take 1 tablet (5 mg total) by mouth daily.     furosemide  20 MG tablet  Commonly known as:  LASIX  Take 1 tablet (20 mg total) by mouth daily. Fluid pill. Also helps with blood pressure.     lisinopril 10 MG tablet  Commonly known as:  PRINIVIL,ZESTRIL  Take 1 tablet (10 mg total) by mouth daily. For blood pressure     nicotine 21 mg/24hr patch  Commonly known as:  NICODERM CQ - dosed in mg/24 hours  Place 1 patch onto the skin daily.     nicotine polacrilex 2 MG lozenge  Commonly known as:  NICORETTE  Place 1 lozenge (2 mg total) inside cheek as needed for smoking cessation.        Florence Canner, MS4  04/07/2012 1:26 PM  Family Medicine Upper Level Addendum:   I have seen and examined the patient independently, discussed with Florence Canner MSIV, fully reviewed the discharge summary and agree with it's contents with the additions as noted in blue text.    Garret Reddish, MD, PGY2 04/07/2012 2:10 PM

## 2012-04-06 NOTE — Progress Notes (Signed)
  Echocardiogram Echocardiogram Transesophageal has been performed.  [name] 04/06/2012, 2:52 PM

## 2012-04-06 NOTE — H&P (Signed)
     [facility]          INTERVAL PROCEDURE H&P   History and Physical Interval Note:  [id number] PM  [name] has presented today for their planned procedure. The various methods of treatment have been discussed with the patient and family. After consideration of risks, benefits and other options for treatment, the patient has consented to the procedure.  The patients' outpatient history has been reviewed, patient examined, and no change in status from most recent office note within the past 30 days. I have reviewed the patients' chart and labs and will proceed as planned. Questions were answered to the patient's satisfaction.   [name], MD, [facility] Attending Cardiologist [facility] [place] [facility] 04/06/2012, 1:44 PM

## 2012-04-06 NOTE — Consult Note (Signed)
Reason for Consult: Consideration for TEE to evaluate possible mass visualized on 2D echo Referring Physician: Madison Hickman, MD   HPI: [name] is a 47 y.o. AAF with a history of COPD, tobacco abuse, OSA and HTN. She has no known CAD and has never been followed by a cardiologist. She presented to [facility] on 04/04/12 with a complaint of worsening SOB and productive cough x 1 week. She was admitted for CHF and  COPD exacerbation. BNP was elevated at 7,222. CXR showed cardiomegaly, pulmonary vascular congestion and mild interstitial edema. She was placed on Lasix for diuresis. She had a 2 D echo at time of admission that demonstrated hypertrophic cardiomyopathy with diastolic dysfunction. She had normal systolic function with an EF of 55-65%.  There did appear to be a medium-sized, frond-like, mobile mass on the left ventricular aspect of the anterior leaflet of the mitral valve.  We are asked to see her in consult for consideration for TEE for clearer imaging of the heart valves. She continues to endorse mild SOB. She denies current or recent CP. No dysphagia, orthopnea/PND, lightheadedness, dizziness, syncope/presyncope, LEE,  n/v, hematuria or hematochezia.    Past Medical History  Diagnosis Date  . Hyperlipidemia   . COPD (chronic obstructive pulmonary disease)   . Fibrocystic breast disease   . GERD (gastroesophageal reflux disease)   . Hypertension 04-10-11    had stop taking meds, started back 3 weeks ago, B/P remains elevated  . Cervical dysplasia or atypia 04-10-11    '93- once dx.-got pregnant-no intervention, then postpartum, no dysplasia found  . Asthma   . Sleep apnea 04-10-11    01-2008 sleep study report with chart-doesn't use cpap  . Migraine headache   . Arthritis 04-10-11    hips, shoulders, back    Past Surgical History  Procedure Laterality Date  . Breast cyst excision      bilateral breast  . Neck surgery  04-10-11    x3- cervical fusion with plating and screws-Dr. [name]  .  Back surgery  04-10-11    x5-Lumbar fusion-retained hardware.(Dr. Louanne Skye)  . Anal fistulectomy  04/17/2011    Procedure: FISTULECTOMY ANAL;  Surgeon: Imogene Burn. [name], MD;  Location: [facility];  Service: General;  Laterality: N/A;  Excision of Condyloma Gluteal Cleft     Family History  Problem Relation Age of Onset  . Stroke Father   . Cancer Maternal Aunt     breast    Social History:  reports that she has been smoking Cigarettes.  She has a 30 pack-year smoking history. She has never used smokeless tobacco. She reports that  drinks alcohol. She reports that she uses illicit drugs (Marijuana).  Allergies:  Allergies  Allergen Reactions  . Levofloxacin Itching  . Methadone Hcl     REACTION: "blacked out" in 1990s    Medications:  Prior to Admission:  Prescriptions prior to admission  Medication Sig Dispense Refill  . albuterol (PROVENTIL HFA;VENTOLIN HFA) 108 (90 BASE) MCG/ACT inhaler Inhale 2 puffs into the lungs every 6 (six) hours as needed. For shortness of breath      . Pseudoephedrine-APAP-DM (DAYQUIL MULTI-SYMPTOM) 60-650-20 MG/30ML LIQD Take 30 mLs by mouth every 6 (six) hours as needed (cold symptoms).      . ARIPiprazole (ABILIFY) 5 MG tablet Take 1 tablet (5 mg total) by mouth daily.  30 tablet  0  . nicotine (NICODERM CQ - DOSED IN MG/24 HOURS) 21 mg/24hr patch Place 1 patch onto the skin daily.  [place]  patch  3  . nicotine polacrilex (NICORETTE) 2 MG lozenge Place 1 lozenge (2 mg total) inside cheek as needed for smoking cessation.  100 tablet  0    Results for orders placed during the hospital encounter of 04/03/12 (from the past 48 hour(s))  BASIC METABOLIC PANEL     Status: Abnormal   Collection Time    04/05/12  5:20 AM      Result Value Range   Sodium 141  135 - 145 mEq/L   Potassium 3.3 (*) 3.5 - 5.1 mEq/L   Chloride 103  96 - 112 mEq/L   CO2 28  19 - 32 mEq/L   Glucose, Bld 104 (*) 70 - 99 mg/dL   BUN 19  6 - 23 mg/dL   Creatinine, Ser 1.47 (*) 0.50 - 1.10  mg/dL   Calcium 9.2  8.4 - 10.5 mg/dL   GFR calc non Af Amer 41 (*) >90 mL/min   GFR calc Af Amer 48 (*) >90 mL/min   Comment:            The eGFR has been calculated     using the CKD EPI equation.     This calculation has not been     validated in all clinical     situations.     eGFR's persistently     <90 mL/min signify     possible Chronic Kidney Disease.  BASIC METABOLIC PANEL     Status: Abnormal   Collection Time    04/06/12 10:50 AM      Result Value Range   Sodium 140  135 - 145 mEq/L   Potassium 3.8  3.5 - 5.1 mEq/L   Chloride 102  96 - 112 mEq/L   CO2 30  19 - 32 mEq/L   Glucose, Bld 88  70 - 99 mg/dL   BUN 19  6 - 23 mg/dL   Creatinine, Ser 1.34 (*) 0.50 - 1.10 mg/dL   Calcium 9.2  8.4 - 10.5 mg/dL   GFR calc non Af Amer 46 (*) >90 mL/min   GFR calc Af Amer 53 (*) >90 mL/min   Comment:            The eGFR has been calculated     using the CKD EPI equation.     This calculation has not been     validated in all clinical     situations.     eGFR's persistently     <90 mL/min signify     possible Chronic Kidney Disease.  MAGNESIUM     Status: None   Collection Time    04/06/12 10:50 AM      Result Value Range   Magnesium 2.1  1.5 - 2.5 mg/dL  CBC     Status: None   Collection Time    04/06/12 10:50 AM      Result Value Range   WBC 10.0  4.0 - 10.5 K/uL   RBC 4.84  3.87 - 5.11 MIL/uL   Hemoglobin 14.0  12.0 - 15.0 g/dL   HCT 39.3  36.0 - 46.0 %   MCV 81.2  78.0 - 100.0 fL   MCH 28.9  26.0 - 34.0 pg   MCHC 35.6  30.0 - 36.0 g/dL   RDW 14.1  11.5 - 15.5 %   Platelets 325  150 - 400 K/uL    No results found.  Review of Systems  Constitutional: Negative for fever, chills and diaphoresis.  HENT: Negative  for sore throat.   Respiratory: Positive for cough, sputum production (clear colored) and shortness of breath. Negative for hemoptysis and wheezing.   Cardiovascular: Negative for chest pain, palpitations, orthopnea, claudication, leg swelling and  PND.  Gastrointestinal: Negative for nausea, vomiting, abdominal pain, diarrhea, blood in stool and melena.  Genitourinary: Negative for hematuria.  Neurological: Negative for dizziness and loss of consciousness.   Blood pressure 156/80, pulse 85, temperature 97.6 F (36.4 C), temperature source Oral, resp. rate 18, height 5\' 10"  (1.778 m), weight 330 lb 14.6 oz (150.1 kg), last menstrual period 04/10/2006, SpO2 97.00%. Physical Exam  Constitutional: She is oriented to person, place, and time. She appears well-developed and well-nourished. No distress.  HENT:  Head: Normocephalic and atraumatic.  Eyes: Conjunctivae and EOM are normal. Pupils are equal, round, and reactive to light.  Cardiovascular: Normal rate, regular rhythm, normal heart sounds and intact distal pulses.  Exam reveals no gallop and no friction rub.   No murmur heard. Pulses:      Radial pulses are 2+ on the right side, and 2+ on the left side.       Dorsalis pedis pulses are 2+ on the right side, and 2+ on the left side.  Respiratory: Effort normal and breath sounds normal. No respiratory distress. She has no wheezes. She has no rales.  GI: Soft. Bowel sounds are normal. She exhibits no distension and no mass. There is no tenderness.  Musculoskeletal: She exhibits no edema.  Neurological: She is alert and oriented to person, place, and time.  Skin: Skin is warm and dry. She is not diaphoretic.  Psychiatric: She has a normal mood and affect. Her behavior is normal.    Assessment/Plan: Principal Problem:   Dyspnea Active Problems:   TOBACCO DEPENDENCE   Hypertensive emergency   COPD exacerbation   Pulmonary edema  Plan: Plan for TEE today with Dr. Debara Pickett to assess for intracardiac mass. HR and BP stable. NSR on telemetry. CP free.   Consuelo Pandy, PA-C 04/06/2012, 1:03 PM

## 2012-04-07 ENCOUNTER — Encounter (HOSPITAL_COMMUNITY): Payer: Self-pay | Admitting: Internal Medicine

## 2012-04-07 DIAGNOSIS — I5033 Acute on chronic diastolic (congestive) heart failure: Principal | ICD-10-CM

## 2012-04-07 DIAGNOSIS — I5032 Chronic diastolic (congestive) heart failure: Secondary | ICD-10-CM | POA: Diagnosis present

## 2012-04-07 LAB — BASIC METABOLIC PANEL
Calcium: 8.9 mg/dL (ref 8.4–10.5)
GFR calc Af Amer: 56 mL/min — ABNORMAL LOW (ref >90)
GFR calc non Af Amer: 49 mL/min — ABNORMAL LOW (ref >90)
Glucose, Bld: 87 mg/dL (ref 70–99)
Potassium: 3.6 mEq/L (ref 3.5–5.1)
Sodium: 139 mEq/L (ref 135–145)

## 2012-04-07 LAB — LIPID PANEL
Cholesterol: 192 mg/dL (ref 0–200)
Total CHOL/HDL Ratio: 3.8 RATIO
Triglycerides: 133 mg/dL (ref <150)

## 2012-04-07 LAB — HEMOGLOBIN A1C
Hgb A1c MFr Bld: 5.5 % (ref <5.7)
Mean Plasma Glucose: 111 mg/dL (ref <117)

## 2012-04-07 LAB — CBC
MCH: 29.2 pg (ref 26.0–34.0)
Platelets: 322 10*3/uL (ref 150–400)
RBC: 4.87 MIL/uL (ref 3.87–5.11)
WBC: 11.6 10*3/uL — ABNORMAL HIGH (ref 4.0–10.5)

## 2012-04-07 LAB — TSH: TSH: 0.457 u[IU]/mL (ref 0.350–4.500)

## 2012-04-07 MED ORDER — AMLODIPINE BESYLATE 10 MG PO TABS: 10.0000 mg | ORAL_TABLET | Freq: Every day | ORAL | Status: DC

## 2012-04-07 MED ORDER — LISINOPRIL 10 MG PO TABS: 10.0000 mg | ORAL_TABLET | Freq: Every day | ORAL | Status: DC

## 2012-04-07 MED ORDER — PANTOPRAZOLE SODIUM 40 MG PO TBEC
40.0000 mg | DELAYED_RELEASE_TABLET | Freq: Every day | ORAL | Status: DC
  Administered 2012-04-07: 40 mg via ORAL

## 2012-04-07 MED ORDER — ALBUTEROL SULFATE HFA 108 (90 BASE) MCG/ACT IN AERS: 2.0000 | INHALATION_SPRAY | Freq: Four times a day (QID) | RESPIRATORY_TRACT | Status: DC | PRN

## 2012-04-07 MED ORDER — FUROSEMIDE 20 MG PO TABS: 20.0000 mg | ORAL_TABLET | Freq: Every day | ORAL | Status: DC

## 2012-04-07 NOTE — Progress Notes (Signed)
Patient ID: [name], female   DOB: 23-Jul-1965, 47 y.o.   MRN: [id number]  PGY-1 Daily Progress Note Family Medicine Teaching Service Florence Canner MS4 Service Pager: [phone]   Subjective: Breathing more comfortable overnight, no O2 requirement since yesterday afternoon.  Less SOB with walking to/from bathroom. Cough improved. No CP, dizziness, visual changes, f/c, h/a, abdominal discomfort, urinary symptoms. ROS: none noted.    Objective:  VITALS Temp:  [98 F (36.7 C)-98.6 F (37 C)] 98.6 F (37 C) (03/11 0500) Pulse Rate:  [86-87] 87 (03/11 0500) Resp:  [16-37] 18 (03/11 0500) BP: (132-179)/(72-118) 145/81 mmHg (03/11 0500) SpO2:  [95 %-99 %] 96 % (03/11 0500) Weight:  [335 lb 1.6 oz (152 kg)] 335 lb 1.6 oz (152 kg) (03/11 0500)  In/Out  Intake/Output Summary (Last 24 hours) at 04/07/12 0804 Last data filed at 04/07/12 0500  Gross per 24 hour  Intake      0 ml  Output   2200 ml  Net  -2200 ml    Physical Exam: Gen:  NAD HEENT: moist mucous membranes CV: Regular rate and rhythm, no murmurs rubs or gallops PULM: clear to auscultation bilaterally. No wheezes/rales/rhonchi. No increased work of breathing.  ABD: soft/nontender/nondistended EXT: minimal pitting edema LE to mid shin bilaterally Neuro: Alert and oriented x3  MEDS Scheduled Meds: . ipratropium  0.5 mg Nebulization Q6H   And  . albuterol  5 mg Nebulization Q6H  . amLODipine  10 mg Oral Daily  . antiseptic oral rinse  15 mL Mouth Rinse q12n4p  . chlorhexidine  15 mL Mouth Rinse BID  . furosemide  40 mg Oral Daily  . heparin  5,000 Units Subcutaneous Q8H  . nicotine  21 mg Transdermal Daily  . potassium chloride  30 mEq Oral Once  . sodium chloride  3 mL Intravenous Q12H   Continuous Infusions:  PRN Meds:.acetaminophen, albuterol, hydrALAZINE, nitroGLYCERIN, ondansetron (ZOFRAN) IV, sodium chloride  Labs and imaging:   CBC  Recent Labs Lab 04/03/12 2333 04/06/12 1050 04/07/12 0425  WBC 14.9*  10.0 11.6*  HGB 16.3* 14.0 14.2  HCT 44.8 39.3 39.4  PLT 297 325 322   BMET/CMET  Recent Labs Lab 04/05/12 0520 04/06/12 1050 04/07/12 0425  NA 141 140 139  K 3.3* 3.8 3.6  CL 103 102 102  CO2 28 30 28   BUN 19 19 17   CREATININE 1.47* 1.34* 1.28*  CALCIUM 9.2 9.2 8.9  GLUCOSE 104* 88 87   Results for orders placed during the hospital encounter of 04/03/12 (from the past 24 hour(s))  BASIC METABOLIC PANEL     Status: Abnormal   Collection Time    04/06/12 10:50 AM      Result Value Range   Sodium 140  135 - 145 mEq/L   Potassium 3.8  3.5 - 5.1 mEq/L   Chloride 102  96 - 112 mEq/L   CO2 30  19 - 32 mEq/L   Glucose, Bld 88  70 - 99 mg/dL   BUN 19  6 - 23 mg/dL   Creatinine, Ser 1.34 (*) 0.50 - 1.10 mg/dL   Calcium 9.2  8.4 - 10.5 mg/dL   GFR calc non Af Amer 46 (*) >90 mL/min   GFR calc Af Amer 53 (*) >90 mL/min  MAGNESIUM     Status: None   Collection Time    04/06/12 10:50 AM      Result Value Range   Magnesium 2.1  1.5 - 2.5 mg/dL  CBC  Status: None   Collection Time    04/06/12 10:50 AM      Result Value Range   WBC 10.0  4.0 - 10.5 K/uL   RBC 4.84  3.87 - 5.11 MIL/uL   Hemoglobin 14.0  12.0 - 15.0 g/dL   HCT 39.3  36.0 - 46.0 %   MCV 81.2  78.0 - 100.0 fL   MCH 28.9  26.0 - 34.0 pg   MCHC 35.6  30.0 - 36.0 g/dL   RDW 14.1  11.5 - 15.5 %   Platelets 325  150 - 400 K/uL  HEMOGLOBIN A1C     Status: None   Collection Time    04/06/12  3:38 PM      Result Value Range   Hemoglobin A1C 5.5  <5.7 %   Mean Plasma Glucose 111  <117 mg/dL  TSH     Status: None   Collection Time    04/06/12  3:38 PM      Result Value Range   TSH 0.457  0.350 - 4.500 uIU/mL  BASIC METABOLIC PANEL     Status: Abnormal   Collection Time    04/07/12  4:25 AM      Result Value Range   Sodium 139  135 - 145 mEq/L   Potassium 3.6  3.5 - 5.1 mEq/L   Chloride 102  96 - 112 mEq/L   CO2 28  19 - 32 mEq/L   Glucose, Bld 87  70 - 99 mg/dL   BUN 17  6 - 23 mg/dL   Creatinine, Ser  1.28 (*) 0.50 - 1.10 mg/dL   Calcium 8.9  8.4 - 10.5 mg/dL   GFR calc non Af Amer 49 (*) >90 mL/min   GFR calc Af Amer 56 (*) >90 mL/min  CBC     Status: Abnormal   Collection Time    04/07/12  4:25 AM      Result Value Range   WBC 11.6 (*) 4.0 - 10.5 K/uL   RBC 4.87  3.87 - 5.11 MIL/uL   Hemoglobin 14.2  12.0 - 15.0 g/dL   HCT 39.4  36.0 - 46.0 %   MCV 80.9  78.0 - 100.0 fL   MCH 29.2  26.0 - 34.0 pg   MCHC 36.0  30.0 - 36.0 g/dL   RDW 14.0  11.5 - 15.5 %   Platelets 322  150 - 400 K/uL  LIPID PANEL     Status: Abnormal   Collection Time    04/07/12  4:25 AM      Result Value Range   Cholesterol 192  0 - 200 mg/dL   Triglycerides 133  <150 mg/dL   HDL 50  >39 mg/dL   Total CHOL/HDL Ratio 3.8     VLDL 27  0 - 40 mg/dL   LDL Cholesterol 115 (*) 0 - 99 mg/dL   No results found.   Assessment  RANNA KNOEDLER is a 47 y.o. year old female with a history of COPD, OSA, HTN, and tobacco abuse who presents with worsening dyspnea and cough secondary to new onset CHF and component of COPD  # Pulmonary edema with concern CHF- BNP elevated at 7222 and chest xray revealed cardiomegaly and pulmonary vascular congestion bordering on mild interstitial edema.  - TEE 3/10 show concentric LVH, no valve growths/vegetations, mild MR with mild thickening of leaflets, normal EF. - patient net negative 2.2L last 24hrs.  - lasix 40mg  PO once daily - Strict I/O's and  Daily weights - Risk stratify: TSH WNL,, A1C 5.5, Lipids: LDL 115 - Discharge medications: lasix 20mg  PO once daily, continue amlodipine 10mg  once daily, (consider beta blocker vs hctz outpatient), ACEI/ and statin with outpatient cardiologist,   COPD exacerbation-CAT and solumedrol at admission;  - Much improved - Duonebs Q6 and Albuterol Q2 PRN ordered.  - No prednisone or antibiotics ordered. Afebrile, WBC normal 3/10.  - supplemental Oxygen as needed. Continue to wean: off 3/11; 3L 3/10; 5L 3/8 - prn robitussin for cough -  Albuterol PRN as outpatient   # AKI/Elevated creatinine - Patient had normal creatinine on 04/10/2011 - Creatinine elevated at 1.28 on admission. Increased to 1.47 3/9; 1.34 3/10, 1.28 3/11.  - patient may have baseline CKD stage III, but may also have AKI. Likely AKI due to hypertensive crisis. Start ACEI/ARB Now   # Hypertension  -BP elevated but most recently well controlled. Continue amlodipine 10mg  PO once daily  - amlodipine 10mg  once daily; A -start ace-i now  #Hypertensive Emergency-resolved (PO clonidine and hydralizine given in ED and first night). PRN hydralazine still ordered.  # OSA-CPAP ordered # Tobacco abuse-Nicotine patch continued. Encouraged cessation.  # Tachycardia resolved FEN/GI: Saline Lock IV.  Heart healthy diet. Hypokalemia- resolvedProphylaxis: Heparin SQ  Disposition: Discharge with f/u one week with [facility] to establish care and medication regimen. [facility] will also follow her.    Code Status: Full code.  Florence Canner MS4   Family Medicine Upper Level Addendum:   I have seen and examined the patient independently, discussed with Florence Canner, MSIV, fully reviewed the progress note and agree with it's contents with the additions as noted in blue text. My independent exam is below.   S: Breathing comfortably. Able to walk to bathroom without difficulty. Hopeful to go home today.   O: BP 167/84  Pulse 87  Temp(Src) 98.6 F (37 C) (Oral)  Resp 18  Ht 5\' 10"  (1.778 m)  Wt 335 lb 1.6 oz (152 kg)  BMI 48.08 kg/m2  SpO2 98%  LMP 04/10/2006 Gen: NAD, resting comfortably in bed HEENT: MMM CV: RRR no mrg, no JVD Lungs: CTAB, slightly distant due to obesity MSK: moves all extremities, no edema  Ext: trace edema Skin: warm and dry, no rash  Neuro: grossly normal   A/P: 47 yo F who has developed concentric hypertrophy and diastolic CHF due to noncompliance with BP medication.  -breathing improved. Ready for discharge. Continue low dose lasix.   -TEE negative for vegetation/myxoma -plan for discharge today with plan for increased HTN regimen (add Ace to amlodipine) -stressed to patient importance of compliance -Likely now with CKD. cr trending down with diuresis. Will start ace for diastolic CHF and renal protective benefit.   Garret Reddish, MD, PGY2 04/07/2012 12:43 PM

## 2012-04-08 NOTE — Progress Notes (Signed)
FMTS Attending Note Patient seen and examined by me on 04/07/2012, discussed with resident team and I agree with Dr Ansel Bong assessment and plan for discharge. Dalbert Mayotte, MD

## 2012-04-08 NOTE — Discharge Summary (Signed)
FMTS Attending Note Patient seen and examined by me on the date of discharge, I agree with assessment and plan for discharge. Dalbert Mayotte, MD

## 2012-04-14 ENCOUNTER — Ambulatory Visit (INDEPENDENT_AMBULATORY_CARE_PROVIDER_SITE_OTHER): Payer: Medicare Other | Admitting: Family Medicine

## 2012-04-14 ENCOUNTER — Encounter: Payer: Self-pay | Admitting: Family Medicine

## 2012-04-14 VITALS — BP 174/92 | HR 101 | Temp 98.2°F | Ht 70.0 in | Wt 322.0 lb

## 2012-04-14 DIAGNOSIS — E785 Hyperlipidemia, unspecified: Secondary | ICD-10-CM

## 2012-04-14 DIAGNOSIS — I5030 Unspecified diastolic (congestive) heart failure: Secondary | ICD-10-CM | POA: Diagnosis not present

## 2012-04-14 DIAGNOSIS — J309 Allergic rhinitis, unspecified: Secondary | ICD-10-CM

## 2012-04-14 DIAGNOSIS — I509 Heart failure, unspecified: Secondary | ICD-10-CM

## 2012-04-14 DIAGNOSIS — F17200 Nicotine dependence, unspecified, uncomplicated: Secondary | ICD-10-CM | POA: Diagnosis not present

## 2012-04-14 DIAGNOSIS — I10 Essential (primary) hypertension: Secondary | ICD-10-CM | POA: Diagnosis not present

## 2012-04-14 LAB — IRON AND TIBC
%SAT: 24 % (ref 20–55)
Iron: 77 ug/dL (ref 42–145)

## 2012-04-14 LAB — BASIC METABOLIC PANEL
BUN: 10 mg/dL (ref 6–23)
Calcium: 9.1 mg/dL (ref 8.4–10.5)
Glucose, Bld: 109 mg/dL — ABNORMAL HIGH (ref 70–99)

## 2012-04-14 MED ORDER — PRAVASTATIN SODIUM 40 MG PO TABS: 40.0000 mg | ORAL_TABLET | Freq: Every day | ORAL | Status: DC

## 2012-04-14 MED ORDER — LISINOPRIL 40 MG PO TABS: 40.0000 mg | ORAL_TABLET | Freq: Every day | ORAL | Status: DC

## 2012-04-14 MED ORDER — CETIRIZINE HCL 10 MG PO CAPS: 1.0000 | ORAL_CAPSULE | ORAL | Status: DC

## 2012-04-14 MED ORDER — CARVEDILOL 12.5 MG PO TABS: 12.5000 mg | ORAL_TABLET | Freq: Two times a day (BID) | ORAL | Status: DC

## 2012-04-14 NOTE — Patient Instructions (Addendum)
1. For your blood pressure, I have added carvedilol to your regimen which you will take twice a day. I have increased your lisinopril dose to 40 mg.  2. For your heart failure, I am glad things are stable. Remember if you gain 5 lbs in 3 days, get wosrened swelling in your legs or get short of breath or have chest pain we should see you immediately.  3. Make a schedule at the front desk to see Dr. Valentina Lucks. Try to find replacement for those cigarettes like going on a walk or putting a toothpick in your mouth.  4. For cholesterol, try the new medicine.  5. We are going to check your iron levels associated with your heart failure.   Thanks, Dr. Yong Channel  See me within 1-2 weeks so we can recheck your blood pressure and make sure you are doing ok on the medicines.

## 2012-04-15 ENCOUNTER — Encounter: Payer: Self-pay | Admitting: Family Medicine

## 2012-04-15 NOTE — Progress Notes (Signed)
Subjective:  Hospital follow up   1. Hypertension- BP Readings from Last 3 Encounters:  04/14/12 174/92  04/07/12 167/84  04/07/12 167/84   Home BP monitoring-no Compliant with medications-yes without side effects reportedly (lisinopril 10, lasix 20, amlodipine 10). Did manual recheck with large cuff and still elevated to levels higher than hospital.  Denies any CP, HA, SOB, blurry vision, LE edema, transient weakness, orthopnea, PND.   2. CHF diastolic-hospitalized with hypertensive emergency likely triggering CHF exacerbation. patient down 13 lbs since hospitalization. Compliant with lasix and lisinopril. No sob, chest pain as above. Has not had iron studies related to CHF previously.   3. Tobacco abuse-using 21 mg nicotine patch but still smoking 1/2 PPD. 10/10 importance. 1010 readiness, 2/10 confidence. Main barrier she states is she doesn't think she can do it. Has seen Dr. Valentina Lucks befor eand would like to see him again. Ready to quit.   4. Hyperlipidemia-LDL >100 in hypertensive, smoker, obese. Diet is an issue for her and doesn't exercise regularly.   ROS--See HPI  Past Medical History Patient Active Problem List  Diagnosis  . HYPERLIPIDEMIA  . OBESITY, NOS  . TOBACCO DEPENDENCE  . DEPRESSIVE DISORDER, NOS  . OBSTRUCTIVE SLEEP APNEA  . NEUROGENIC BLADDER  . MIGRAINE HEADACHE  . ESSENTIAL HYPERTENSION  . COPD  . FIBROCYSTIC BREAST DISEASE  . DYSFUNCTIONAL UTERINE BLEEDING  . ALOPECIA  . GERD  . Unspecified episodic mood disorder  . Skin lesion  . Allergic rhinitis  . Condyloma - gluteal cleft  . Hypertensive emergency  . COPD exacerbation  . Pulmonary edema  . Acute on chronic diastolic heart failure   Reviewed problem list.  Medications- reviewed and updated Chief complaint-noted  Objective: BP 174/92  Pulse 101  Temp(Src) 98.2 F (36.8 C) (Oral)  Ht 5\' 10"  (1.778 m)  Wt 322 lb (146.058 kg)  BMI 46.2 kg/m2  LMP 04/10/2006 Gen: NAD, resting comfortably,  walks to and away from room without breathing difficulty, morbidly obese CV: RRR no murmurs rubs or gallops Lungs: CTAB no crackles, wheeze, rhonchi Skin: warm, dry Ext: trace edema Neuro: grossly normal, moves all extremities  Assessment/Plan:

## 2012-04-15 NOTE — Assessment & Plan Note (Addendum)
Wt down 13 lbs. Compliant with lasix and lisinopril reportedly. Will continue at current doses.   Iron studies completed for pharmacy study. Not anemic. Ferritin not decreased. % saturation borderline. Will discuss with pharmacy study but not planning on supplementing currently.

## 2012-04-15 NOTE — Assessment & Plan Note (Signed)
Will start on pravastatin and check in 6 weeks. Goal technically could be 130 but given patient recently hospitalized for CHF, desire to be more aggressive regarding risk for MI. ASA not proven primary prevention for MI in women at this age.

## 2012-04-15 NOTE — Assessment & Plan Note (Addendum)
Referred to Dr. Valentina Lucks for motivational interviewing and help with confidence issues. May need lozenges/gum to augment nicotine patch. See AVS.   Will send to [facility] team.

## 2012-04-15 NOTE — Assessment & Plan Note (Signed)
Poorly controlled. Given BP higher out of hospital on same meds reportedly-I am concerned about noncompliance despite patient's claims of taking meds. I am going to increase lisinopril to 40mg  and add carvedilol. Patient to follow up at my next available and suspect we will have to add HCTZ as well at that time.   If continues to be elevated at next visit, may need referral to [facility] team.

## 2012-04-27 ENCOUNTER — Ambulatory Visit (INDEPENDENT_AMBULATORY_CARE_PROVIDER_SITE_OTHER): Payer: Medicare Other | Admitting: Pharmacist

## 2012-04-27 ENCOUNTER — Encounter: Payer: Self-pay | Admitting: Pharmacist

## 2012-04-27 VITALS — BP 137/73 | HR 82 | Wt 323.1 lb

## 2012-04-27 DIAGNOSIS — F17200 Nicotine dependence, unspecified, uncomplicated: Secondary | ICD-10-CM

## 2012-04-27 DIAGNOSIS — I10 Essential (primary) hypertension: Secondary | ICD-10-CM | POA: Diagnosis not present

## 2012-04-27 MED ORDER — NICOTINE 21 MG/24HR TD PT24: 1.0000 | MEDICATED_PATCH | TRANSDERMAL | Status: DC

## 2012-04-27 NOTE — Progress Notes (Signed)
  Subjective:    Patient ID: [name], female    DOB: 04-02-1965, 47 y.o.   MRN: [id number]  HPI  Patient arrives in excellent spirits.  She states she has been smoking less since hospitalization with use of 21mg  nicotine patches.   She has 2 more weeks supply.    She is interested in utilizing inhalation Nicotine (e-cigarettes) in addition to patches as her quit plan.   She reports she know three people who have used the e-cigs and she states she is motivated to be successful at this time (10/10)   Review of Systems     Objective:   Physical Exam        Assessment & Plan:  moderate Nicotine Dependence of many years duration in a patient who is fair candidate for success b/c of current level of motivation.   She is motivated by hospitalization.  Initiated nicotine replacement tx of patches plus minimal use of e-cigs starting on Thursday 4/3 (when she gets per check for the month). Patient counseled on purpose, proper use, and potential adverse effects, including patch irritation.   She was advised to use the e-cigs<7 times per day.   Written information provided.   F/U Rx Clinic Visit  In 2-3 weeks. Total time in face-to-face counseling 20 minutes.

## 2012-04-27 NOTE — Assessment & Plan Note (Signed)
BP significantly improved in patient who reports adherence with prescribed therapy.  Weight remains at 323# which is ~ 10 pounds less than when she was admitted recently.

## 2012-04-27 NOTE — Progress Notes (Signed)
Patient ID: [name], female   DOB: [birth year], 47 y.o.   MRN: [id number] Reviewed:  Agree with Dr. Graylin Shiver documentation and management.

## 2012-04-27 NOTE — Patient Instructions (Addendum)
Great to see you today.   Please keep taking your medicines!  Continue to work on cutting down on smoking until Thursday - Stay on your nicotine.   On Thursday, please start using your nicotine inhaler in addition to your patch. Seven times per day or less is our goal.   Follow - UP with Dr. Valentina Lucks in 2-3 weeks for BP, Weight check and also for smoking cessation follow up.

## 2012-04-27 NOTE — Assessment & Plan Note (Signed)
moderate Nicotine Dependence of many years duration in a patient who is fair candidate for success b/c of current level of motivation.   She is motivated by hospitalization.  Initiated nicotine replacement tx of patches plus minimal use of e-cigs starting on Thursday 4/3 (when she gets per check for the month). Patient counseled on purpose, proper use, and potential adverse effects, including patch irritation.   She was advised to use the e-cigs<7 times per day.   Written information provided.   F/U Rx Clinic Visit  In 2-3 weeks. Total time in face-to-face counseling 20 minutes.

## 2012-04-29 ENCOUNTER — Ambulatory Visit: Payer: Medicare Other | Admitting: Family Medicine

## 2012-04-29 DIAGNOSIS — I5043 Acute on chronic combined systolic (congestive) and diastolic (congestive) heart failure: Secondary | ICD-10-CM | POA: Diagnosis not present

## 2012-04-29 DIAGNOSIS — R609 Edema, unspecified: Secondary | ICD-10-CM | POA: Diagnosis not present

## 2012-04-29 DIAGNOSIS — F17200 Nicotine dependence, unspecified, uncomplicated: Secondary | ICD-10-CM | POA: Diagnosis not present

## 2012-04-29 DIAGNOSIS — I10 Essential (primary) hypertension: Secondary | ICD-10-CM | POA: Diagnosis not present

## 2012-05-18 ENCOUNTER — Encounter: Payer: Self-pay | Admitting: Pharmacist

## 2012-05-18 ENCOUNTER — Encounter: Payer: Self-pay | Admitting: Family Medicine

## 2012-05-18 ENCOUNTER — Ambulatory Visit (INDEPENDENT_AMBULATORY_CARE_PROVIDER_SITE_OTHER): Payer: Medicare Other | Admitting: Pharmacist

## 2012-05-18 VITALS — BP 170/96 | HR 76 | Ht 69.0 in | Wt 321.0 lb

## 2012-05-18 DIAGNOSIS — F17200 Nicotine dependence, unspecified, uncomplicated: Secondary | ICD-10-CM | POA: Diagnosis not present

## 2012-05-18 DIAGNOSIS — E669 Obesity, unspecified: Secondary | ICD-10-CM | POA: Diagnosis not present

## 2012-05-18 NOTE — Progress Notes (Signed)
Patient ID: [name], female   DOB: 1965/06/17, 47 y.o.   MRN: [id number] Reviewed: Agree with Dr. Graylin Shiver documentation and management

## 2012-05-18 NOTE — Assessment & Plan Note (Signed)
Patient encouraged to focus on weight loss plan of losing 30 lbs in the next 3 months.   F/U Rx Clinic Visit in August.     Patient plans to eat more vegetables and whole fruit.

## 2012-05-18 NOTE — Assessment & Plan Note (Signed)
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   Encouraged continued Quantity Taper plan for the next few weeks.  If able to cut down to 5 cigarettes or less per day.  Then consider Initiation of oral  nicotine replacement tx.  Discussed weight loss and craving strategy of using fruits and vegetables.  Patient encouraged to focus on weight loss plan of losing 30 lbs in the next 3 months.   F/U Rx Clinic Visit in August.     Total time in face-to-face counseling 35 minutes.  Patient seen with Narda Bonds, PharmD Resident and Nicoletta Ba, PharmD candidate.

## 2012-05-18 NOTE — Patient Instructions (Addendum)
It was great to see you today!  You lost two pounds! Way to go! Keep up the good work!  Please work on your diet plan of more vegetables and fruit to help with craving.   Exercise - Swimming more days than you miss.    Go swimming more!   Losing 30 pounds in 3 months - YOU CAN DO IT!  Smoking 5 or less cigarettes per day.    Three months of smoking this amount is our new goal.   Pharmacy Clinic for progress check in August (early August) or earlier if needed  Good luck with you plans.

## 2012-05-18 NOTE — Progress Notes (Signed)
  Subjective:    Patient ID: [name], female    DOB: 1965-12-17, 47 y.o.   MRN: [id number]  HPI Patient arrives in good spirits.   She states she continues to smoke 10 cigarettes per day.  She was unable to use nicotine patches due to a small rash with bumps that appears after using the "generic nicotine patches"  Age when started using tobacco on a daily basis 12. Number of Cigarettes per day 10 (down from 50 previously). Brand smoked [place] 100s.. Estimated Nicotine Content per Cigarette (mg) 1.  Estimated Nicotine intake per day 10mg .   Smokes first cigarette < 5 min minutes after waking. Smokes 3-4 times per night.    Estimated Fagerstrom Score >5/10.  Most recent quit attempt last month Longest time ever been tobacco free 1-2 weeks. What Medications (NRT, bupropion, varenicline) used in past includes most options.  Rates IMPORTANCE of quitting tobacco on 1-10 scale of 10. Rates READINESS of quitting tobacco on 1-10 scale of <5. Rates CONFIDENCE of quitting tobacco on 1-10 scale of <5. Triggers to use tobacco include; STRESS relief, Argumentation      Review of Systems     Objective:   Physical Exam  Currently at 10 cigarettes per day.       Assessment & Plan:  Currently mild Nicotine Dependence ina patient with hitory of severe nicotine abuse of many years duration in a patient who is fair candidate for success b/c of current level of motivation however explosive personality disorder makes it harder for her to deal with stressors (cigarettes have been her strategy to relax).   Encouraged continued Quantity Taper plan for the next few weeks.  If able to cut down to 5 cigarettes or less per day.  Then consider Initiation of oral  nicotine replacement tx.  Discussed weight loss and craving strategy of using fruits and vegetables.  Patient encouraged to focus on weight loss plan of losing 30 lbs in the next 3 months.   F/U Rx Clinic Visit in August.     Total time in  face-to-face counseling 35 minutes.  Patient seen with Narda Bonds, PharmD Resident and Nicoletta Ba, PharmD candidate.

## 2012-05-19 ENCOUNTER — Encounter: Payer: Self-pay | Admitting: Family Medicine

## 2012-05-19 ENCOUNTER — Ambulatory Visit: Payer: Medicare Other | Admitting: Family Medicine

## 2012-06-18 ENCOUNTER — Other Ambulatory Visit: Payer: Self-pay | Admitting: *Deleted

## 2012-06-18 MED ORDER — ARIPIPRAZOLE 10 MG PO TABS: 10.0000 mg | ORAL_TABLET | Freq: Every day | ORAL | Status: DC

## 2012-06-18 MED ORDER — ESOMEPRAZOLE MAGNESIUM 20 MG PO CPDR: 20.0000 mg | DELAYED_RELEASE_CAPSULE | Freq: Every day | ORAL | Status: DC

## 2012-08-19 ENCOUNTER — Encounter: Payer: Self-pay | Admitting: Cardiology

## 2012-08-19 ENCOUNTER — Ambulatory Visit (INDEPENDENT_AMBULATORY_CARE_PROVIDER_SITE_OTHER): Payer: Medicare Other | Admitting: Cardiology

## 2012-08-19 VITALS — BP 136/88 | HR 84 | Ht 70.0 in | Wt 320.4 lb

## 2012-08-19 DIAGNOSIS — G4733 Obstructive sleep apnea (adult) (pediatric): Secondary | ICD-10-CM

## 2012-08-19 DIAGNOSIS — E785 Hyperlipidemia, unspecified: Secondary | ICD-10-CM

## 2012-08-19 DIAGNOSIS — I509 Heart failure, unspecified: Secondary | ICD-10-CM | POA: Diagnosis not present

## 2012-08-19 DIAGNOSIS — I5032 Chronic diastolic (congestive) heart failure: Secondary | ICD-10-CM

## 2012-08-19 DIAGNOSIS — E669 Obesity, unspecified: Secondary | ICD-10-CM

## 2012-08-19 DIAGNOSIS — J449 Chronic obstructive pulmonary disease, unspecified: Secondary | ICD-10-CM

## 2012-08-19 DIAGNOSIS — I10 Essential (primary) hypertension: Secondary | ICD-10-CM | POA: Diagnosis not present

## 2012-08-19 DIAGNOSIS — F17200 Nicotine dependence, unspecified, uncomplicated: Secondary | ICD-10-CM

## 2012-08-19 MED ORDER — HYDROCHLOROTHIAZIDE 12.5 MG PO CAPS: 12.5000 mg | ORAL_CAPSULE | Freq: Every day | ORAL | Status: DC

## 2012-08-19 NOTE — Assessment & Plan Note (Signed)
Currently compensated 

## 2012-08-19 NOTE — Patient Instructions (Addendum)
Continue to work on your smoking.  Avoid salt in your diet.   Please return for a blood pressure check in 1 month. Return in 6 months.

## 2012-08-19 NOTE — Assessment & Plan Note (Signed)
No recent SOB

## 2012-08-19 NOTE — Progress Notes (Signed)
[id number] [name]   [id number]  [id number]  Primary Physicia Garret Reddish, MD Primary Cardiologist: Dr Ellyn Hack  HPI:  47 y/o obese AA female we saw in March 2014 when she was admitted for SOB initially felt to be an asthma/ COPD flair. She has HTN with diastolic dysfunction and we felt she had diastolic CHF then. An echo suggested she may have a MV mass but TEE did not show this. She is followed at [facility]. She is here today for routine 6 month follow up. Since we saw her last she has been doing well. She denies any hospitalizations or episodes of SOB. She continues to  Smoke 3/4 pack a day. She says she is compliant with C-pap at night. She is not compliant with a low salt diet, eating potato chips at home "all the time".   Current Outpatient Prescriptions  Medication Sig Dispense Refill  . albuterol (PROVENTIL HFA;VENTOLIN HFA) 108 (90 BASE) MCG/ACT inhaler Inhale 2 puffs into the lungs every 6 (six) hours as needed. For shortness of breath  1 Inhaler  3  . amLODipine (NORVASC) 10 MG tablet Take 1 tablet (10 mg total) by mouth daily. For blood pressure.  30 tablet  5  . ARIPiprazole (ABILIFY) 10 MG tablet Take 1 tablet (10 mg total) by mouth daily. Appointment needed before further refills.  30 tablet  1  . carvedilol (COREG) 12.5 MG tablet Take by mouth 2 (two) times daily with a meal. 1 and 1/2 tabs twice daily      . Cetirizine HCl 10 MG CAPS Take 1 capsule (10 mg total) by mouth every morning.  30 capsule  11  . esomeprazole (NEXIUM) 20 MG capsule Take 1 capsule (20 mg total) by mouth daily before breakfast.  30 capsule  11  . furosemide (LASIX) 20 MG tablet Take 20 mg by mouth daily.      [name] Kitchen lisinopril (PRINIVIL,ZESTRIL) 40 MG tablet Take 1 tablet (40 mg total) by mouth daily. For blood pressure  30 tablet  5  . pravastatin (PRAVACHOL) 40 MG tablet Take 1 tablet (40 mg total) by mouth daily. $4 [employer] list.  30 tablet  11  . [DISCONTINUED] sucralfate (CARAFATE) 1 G tablet Take 1  tablet (1 g total) by mouth daily.  30 tablet  6   No current facility-administered medications for this visit.    Allergies  Allergen Reactions  . Levofloxacin Itching  . Methadone Hcl Other (See Comments)    REACTION: "blacked out" in 1990s  . Nicotine Other (See Comments)    Patch caused a rash    History   Social History  . Marital Status: Divorced    Spouse Name: N/A    Number of Children: N/A  . Years of Education: N/A   Occupational History  . Not on file.   Social History Main Topics  . Smoking status: Current Every Day Smoker -- 0.75 packs/day for 30 years    Types: Cigarettes  . Smokeless tobacco: Never Used  . Alcohol Use: Yes     Comment: occasionally  . Drug Use: Yes    Special: Marijuana     Comment: occasionally  . Sexually Active: No   Other Topics Concern  . Not on file   Social History Narrative  . No narrative on file     Review of Systems: General: negative for chills, fever, night sweats or weight changes.  Cardiovascular: negative for chest pain, dyspnea on exertion, edema, orthopnea, palpitations, paroxysmal nocturnal  dyspnea or shortness of breath Dermatological: negative for rash Respiratory: negative for cough or wheezing Urologic: negative for hematuria Abdominal: negative for nausea, vomiting, diarrhea, bright red blood per rectum, melena, or hematemesis Neurologic: negative for visual changes, syncope, or dizziness All other systems reviewed and are otherwise negative except as noted above.    Blood pressure 136/88, pulse 84, height 5\' 10"  (1.778 m), weight 320 lb 6.4 oz (145.332 kg), last menstrual period 04/10/2006.  General appearance: alert, cooperative, no distress and morbidly obese Lungs: clear to auscultation bilaterally Heart: regular rate and rhythm, S1, S2 normal, no murmur, click, rub or gallop Extrem: no edema  EKG  EKG: normal EKG, normal sinus rhythm, unchanged from previous tracings.  ASSESSMENT AND PLAN:    Hypertension, poor control Avoid salt  HYPERLIPIDEMIA On st  COPD No recent SOB  OBSTRUCTIVE SLEEP APNEA On C-pap  TOBACCO DEPENDENCE 3/4 pk a day  OBESITY, NOS .  Diastolic CHF Currently compensated   PLAN  Her B/P was a little high- 142/92. I considered adding HCTZ but on her way out she remembered she takes Lasix. For now she will work on her smoking. We also discussed the importance of a low salt diet. She'll get a B/P check in a month and OV in 6 months.   [facility] KPA-C 08/19/2012 8:39 AM

## 2012-08-19 NOTE — Assessment & Plan Note (Signed)
3/4 pk a day

## 2012-08-19 NOTE — Assessment & Plan Note (Signed)
On C-pap 

## 2012-08-19 NOTE — Assessment & Plan Note (Addendum)
Avoid salt

## 2012-08-19 NOTE — Assessment & Plan Note (Signed)
On st

## 2012-09-15 ENCOUNTER — Ambulatory Visit (INDEPENDENT_AMBULATORY_CARE_PROVIDER_SITE_OTHER): Payer: Medicare Other | Admitting: Family Medicine

## 2012-09-15 ENCOUNTER — Encounter: Payer: Self-pay | Admitting: Family Medicine

## 2012-09-15 VITALS — BP 140/84 | HR 90 | Temp 98.1°F | Wt 322.0 lb

## 2012-09-15 DIAGNOSIS — I509 Heart failure, unspecified: Secondary | ICD-10-CM

## 2012-09-15 DIAGNOSIS — F17200 Nicotine dependence, unspecified, uncomplicated: Secondary | ICD-10-CM

## 2012-09-15 DIAGNOSIS — I10 Essential (primary) hypertension: Secondary | ICD-10-CM | POA: Diagnosis not present

## 2012-09-15 DIAGNOSIS — E785 Hyperlipidemia, unspecified: Secondary | ICD-10-CM | POA: Diagnosis not present

## 2012-09-15 DIAGNOSIS — I5030 Unspecified diastolic (congestive) heart failure: Secondary | ICD-10-CM | POA: Diagnosis not present

## 2012-09-15 LAB — LDL CHOLESTEROL, DIRECT: Direct LDL: 114 mg/dL — ABNORMAL HIGH

## 2012-09-15 LAB — BASIC METABOLIC PANEL
Chloride: 102 mEq/L (ref 96–112)
Potassium: 3.8 mEq/L (ref 3.5–5.3)
Sodium: 140 mEq/L (ref 135–145)

## 2012-09-15 MED ORDER — FUROSEMIDE 20 MG PO TABS: 20.0000 mg | ORAL_TABLET | Freq: Every day | ORAL | Status: DC

## 2012-09-15 NOTE — Patient Instructions (Addendum)
1. Great job cutting back on smoking! You are down to 8 but I believe you can quit completely. [employer] now is a resource for quitting smoking. They have free nicotine replacement oftentimes.  2. For your cholesterol, keep taking your medicine. We are going to make sure it is stable today.  3. Your blood pressure was up again today, keep taking all of your medication. Great job taking such a complex regimen!  *we are going to check some bloodwork to see how your kidneys are doing on all your medicine *please watch your diet and avoid high salt meals *please try to exercise regularly at least 30 minutes as a goal eventually by walking. Stop if you have any chest pain with this or shortness of breath greater than exepected  See me every 3 months for now, Dr. Yong Channel  My 5 to Fitness!  5: fruits and vegetables per day (work on 9 per day if you are at 5) 4: exercise 4-5 times per week for at least 30 minutes (walking counts!) 3: meals per day (don't skip breakfast!) 2: habits to quit -smoking -excess alcohol use (men >2 beer/day; women >1beer/day) 1: sweet per day (2 cookies, 1 small cup of ice cream, 12 oz soda)  These are general tips for healthy living. Try to start with 1 or 2 habit TODAY and make it a part of your life for several months.   Once you have 1 or 2 habits down for several months, try to begin working on your next healthy habit. With every single step you take, you will be leading a healthier lifestyle!

## 2012-09-15 NOTE — Assessment & Plan Note (Signed)
Well controlled . Continue current medications:  lisinopril 40mg , lasix 20mg , amlodipine 10mg , carvedilol 12.5 mg 1.5 tabs BID, hctz 12.5 mg daily. Could titrate up hctz to 25 mg if elevated in future.

## 2012-09-15 NOTE — Assessment & Plan Note (Addendum)
Check LDL today. Continue pravastatin. Would like high intensity but patient doing so well taking medication and changes can be difficult with complex regimen.

## 2012-09-15 NOTE — Progress Notes (Signed)
  [facility] Garret Reddish, MD Phone: [phone]  Subjective:   # Hypertension/Hyperlipidemia/diastolic CHF BP Readings from Last 3 Encounters:  09/15/12 140/84  08/19/12 136/88  05/18/12 170/96  Home BP monitoring-no Compliant with medications-yes without side effects, lisinopril 40mg , lasix 20mg , amlodipine 10mg , carvedilol 12.5 mg 1.5 tabs BID, hctz 12.5 mg daily. Pravastatin 40mg  daily.  Denies any CP, SOB, blurry vision, LE edema, transient weakness, orthopnea, PND.  No regular exercise but able to walk to store without difficulty except for slight HA when going up hills.  Diet-not compliant with low salt. Ate 2 mcd chicken sandwiches last night then had ice cream this morning.  Weight-stable near 320 since March after getting out of hospital.   # Tobacco abuse/COPD Down to 8 cigarettes per day. Rash with patches. No SOB or chest pain as above. Wants to quit but uses it for stress relief, not ready to let go yet. Willing to try quitline.  Has not had to use albuterol recently.   ROS--See HPI  Past Medical History Patient Active Problem List   Diagnosis Date Noted  . Diastolic CHF [id number]    Priority: High  . Unspecified episodic mood disorder 05/02/2010    Priority: High  . TOBACCO DEPENDENCE 03/27/2006    Priority: High  . DEPRESSIVE DISORDER, NOS 03/27/2006    Priority: High  . Hypertension, poor control 08/04/2008    Priority: Medium  . OBSTRUCTIVE SLEEP APNEA 02/03/2008    Priority: Medium  . HYPERLIPIDEMIA 03/27/2006    Priority: Medium  . OBESITY, NOS 03/27/2006    Priority: Medium  . COPD 03/27/2006    Priority: Medium  . Allergic rhinitis 03/29/2011    Priority: Low  . GERD 01/26/2010    Priority: Low  . ALOPECIA 06/14/2009    Priority: Low  . MIGRAINE HEADACHE 10/25/2009  . DYSFUNCTIONAL UTERINE BLEEDING 10/25/2009  . FIBROCYSTIC BREAST DISEASE 10/28/2006  . NEUROGENIC BLADDER 03/27/2006   Reviewed problem list.   Medications- reviewed and updated Chief complaint-noted  Objective: BP 140/84  Pulse 90  Temp(Src) 98.1 F (36.7 C) (Oral)  Wt 322 lb (146.058 kg)  BMI 46.2 kg/m2  LMP 04/10/2006 Gen: NAD, resting comfortably in chair, obese CV: RRR no murmurs rubs or gallops Lungs: CTAB no crackles, wheeze, rhonchi, distant due to obesity Skin: warm, dry Neuro: grossly normal, moves all extremities Ext: trace edema bilaterally.   Assessment/Plan:  Also met our health coach Lamont Dowdy and I think would be wonderful if they would start working together. Patient was given Ms. Lineberry's card.

## 2012-09-15 NOTE — Assessment & Plan Note (Signed)
Encouraged cessation. Gave card for quitline. Not ready to quit but slowly cutting back.

## 2012-09-15 NOTE — Assessment & Plan Note (Signed)
Weight stable. No worsening dyspnea. Continue BP control efforts as per HTN.

## 2012-09-18 ENCOUNTER — Encounter: Payer: Self-pay | Admitting: *Deleted

## 2012-09-19 ENCOUNTER — Encounter: Payer: Self-pay | Admitting: Family Medicine

## 2012-09-21 ENCOUNTER — Encounter: Payer: Self-pay | Admitting: Cardiology

## 2012-09-22 ENCOUNTER — Ambulatory Visit: Payer: Medicare Other | Admitting: Cardiology

## 2012-10-02 ENCOUNTER — Ambulatory Visit (INDEPENDENT_AMBULATORY_CARE_PROVIDER_SITE_OTHER): Payer: Medicare Other | Admitting: Cardiology

## 2012-10-02 ENCOUNTER — Encounter: Payer: Self-pay | Admitting: Cardiology

## 2012-10-02 VITALS — BP 144/80 | HR 80 | Ht 70.0 in | Wt 320.3 lb

## 2012-10-02 DIAGNOSIS — I5032 Chronic diastolic (congestive) heart failure: Secondary | ICD-10-CM

## 2012-10-02 DIAGNOSIS — I10 Essential (primary) hypertension: Secondary | ICD-10-CM

## 2012-10-02 DIAGNOSIS — I509 Heart failure, unspecified: Secondary | ICD-10-CM

## 2012-10-02 DIAGNOSIS — E669 Obesity, unspecified: Secondary | ICD-10-CM

## 2012-10-02 NOTE — Progress Notes (Signed)
[id number] [name]   [id number]  [id number]  Primary Physicia Garret Reddish, MD Primary Cardiologist: Dr Ellyn Hack  HPI:  47 y/o obese AA female we saw in March 2014 when she was admitted for SOB initially felt to be an asthma/ COPD flair. She has HTN with diastolic dysfunction and we felt she had diastolic CHF then. An echo suggested she may have a MV mass but TEE did not show this. She is followed at [facility], just seen by Dr Yong Channel last month. I saw her at the end of July and her B/P was slightly elevated. She came today for a follow up B/P check and was put on my schedule by mistake. She has been doing well, no SOB, no chest pain, no problem with her medications.    Current Outpatient Prescriptions  Medication Sig Dispense Refill  . albuterol (PROVENTIL HFA;VENTOLIN HFA) 108 (90 BASE) MCG/ACT inhaler Inhale 2 puffs into the lungs every 6 (six) hours as needed. For shortness of breath  1 Inhaler  3  . amLODipine (NORVASC) 10 MG tablet Take 1 tablet (10 mg total) by mouth daily. For blood pressure.  30 tablet  5  . ARIPiprazole (ABILIFY) 10 MG tablet Take 1 tablet (10 mg total) by mouth daily. Appointment needed before further refills.  30 tablet  1  . carvedilol (COREG) 12.5 MG tablet 1 and 1/2 tabs twice daily      . Cetirizine HCl 10 MG CAPS Take 1 capsule (10 mg total) by mouth every morning.  30 capsule  11  . esomeprazole (NEXIUM) 20 MG capsule Take 1 capsule (20 mg total) by mouth daily before breakfast.  30 capsule  11  . furosemide (LASIX) 20 MG tablet Take 1 tablet (20 mg total) by mouth daily.  30 tablet  5  . hydrochlorothiazide (MICROZIDE) 12.5 MG capsule Take 12.5 mg by mouth daily.      [name] Kitchen lisinopril (PRINIVIL,ZESTRIL) 40 MG tablet Take 1 tablet (40 mg total) by mouth daily. For blood pressure  30 tablet  5  . NON FORMULARY Uses a C-PAP at bedtime      . pravastatin (PRAVACHOL) 40 MG tablet Take 1 tablet (40 mg total) by mouth daily. $4 [employer] list.  30 tablet  11  .  [DISCONTINUED] sucralfate (CARAFATE) 1 G tablet Take 1 tablet (1 g total) by mouth daily.  30 tablet  6   No current facility-administered medications for this visit.    Allergies  Allergen Reactions  . Levofloxacin Itching  . Methadone Hcl Other (See Comments)    REACTION: "blacked out" in 1990s  . Nicotine Other (See Comments)    Patch caused a rash    History   Social History  . Marital Status: Divorced    Spouse Name: N/A    Number of Children: N/A  . Years of Education: N/A   Occupational History  . Not on file.   Social History Main Topics  . Smoking status: Current Every Day Smoker -- 0.50 packs/day for 30 years    Types: Cigarettes  . Smokeless tobacco: Never Used  . Alcohol Use: Yes     Comment: occasionally - about every 3 weeks  . Drug Use: Yes    Special: Marijuana     Comment: occasionally - once/month  . Sexual Activity: No   Other Topics Concern  . Not on file   Social History Narrative  . No narrative on file     Review of Systems: General: negative  for chills, fever, night sweats or weight changes.  Cardiovascular: negative for chest pain, dyspnea on exertion, edema, orthopnea, palpitations, paroxysmal nocturnal dyspnea or shortness of breath Dermatological: negative for rash Respiratory: negative for cough or wheezing Urologic: negative for hematuria Abdominal: negative for nausea, vomiting, diarrhea, bright red blood per rectum, melena, or hematemesis Neurologic: negative for visual changes, syncope, or dizziness All other systems reviewed and are otherwise negative except as noted above.    Blood pressure 144/80, pulse 80, height 5\' 10"  (1.778 m), weight 320 lb 4.8 oz (145.287 kg), last menstrual period 04/10/2006.  General appearance: alert, cooperative, no distress and morbidly obese Lungs: clear to auscultation bilaterally Heart: regular rate and rhythm Extremities: no edema    ASSESSMENT AND PLAN:   Hypertension Better control,  she has followed up with [facility], Dr Yong Channel, he saw her last month  Chronic diastolic CHF (congestive heart failure) No SOB or edema  OBESITY, NOS .   PLAN  I did not charge her for today's visit. She can follow up with Dr Ellyn Hack in 6 months.  [name] KPA-C 10/02/2012 4:10 PM

## 2012-10-02 NOTE — Assessment & Plan Note (Signed)
No SOB or edema

## 2012-10-02 NOTE — Assessment & Plan Note (Signed)
Better control, she has followed up with [facility], Dr Yong Channel. He saw her last month

## 2012-10-02 NOTE — Patient Instructions (Signed)
Your physician recommends that you schedule a follow-up appointment in: 6 months with Dr Ellyn Hack

## 2012-10-28 ENCOUNTER — Other Ambulatory Visit: Payer: Self-pay | Admitting: Family Medicine

## 2012-10-28 MED ORDER — ARIPIPRAZOLE 10 MG PO TABS: 10.0000 mg | ORAL_TABLET | Freq: Every day | ORAL | Status: DC

## 2013-03-01 ENCOUNTER — Other Ambulatory Visit: Payer: Self-pay | Admitting: Family Medicine

## 2013-03-01 DIAGNOSIS — I10 Essential (primary) hypertension: Secondary | ICD-10-CM

## 2013-03-01 MED ORDER — LISINOPRIL 40 MG PO TABS: 40.0000 mg | ORAL_TABLET | Freq: Every day | ORAL | Status: DC

## 2013-03-01 MED ORDER — AMLODIPINE BESYLATE 10 MG PO TABS: 10.0000 mg | ORAL_TABLET | Freq: Every day | ORAL | Status: DC

## 2013-04-30 ENCOUNTER — Other Ambulatory Visit: Payer: Self-pay | Admitting: Cardiology

## 2013-04-30 NOTE — Telephone Encounter (Signed)
Rx was sent to pharmacy electronically. 

## 2013-05-03 ENCOUNTER — Other Ambulatory Visit: Payer: Self-pay | Admitting: *Deleted

## 2013-05-03 DIAGNOSIS — E785 Hyperlipidemia, unspecified: Secondary | ICD-10-CM

## 2013-05-03 MED ORDER — PRAVASTATIN SODIUM 40 MG PO TABS: 40.0000 mg | ORAL_TABLET | Freq: Every day | ORAL | Status: DC

## 2013-11-01 ENCOUNTER — Encounter: Payer: Self-pay | Admitting: Family Medicine

## 2013-11-01 ENCOUNTER — Ambulatory Visit (INDEPENDENT_AMBULATORY_CARE_PROVIDER_SITE_OTHER): Payer: Medicare Other | Admitting: Family Medicine

## 2013-11-01 ENCOUNTER — Ambulatory Visit (HOSPITAL_COMMUNITY)
Admission: RE | Admit: 2013-11-01 | Discharge: 2013-11-01 | Disposition: A | Discharge status: Home / Self Care | Payer: Medicare Other | Source: Ambulatory Visit | Attending: Family Medicine | Admitting: Family Medicine

## 2013-11-01 VITALS — BP 210/127 | HR 93 | Temp 98.6°F | Wt 319.0 lb

## 2013-11-01 DIAGNOSIS — I10 Essential (primary) hypertension: Secondary | ICD-10-CM | POA: Diagnosis not present

## 2013-11-01 DIAGNOSIS — F329 Major depressive disorder, single episode, unspecified: Secondary | ICD-10-CM

## 2013-11-01 DIAGNOSIS — F32A Depression, unspecified: Secondary | ICD-10-CM

## 2013-11-01 DIAGNOSIS — F1910 Other psychoactive substance abuse, uncomplicated: Secondary | ICD-10-CM

## 2013-11-01 MED ORDER — AMLODIPINE BESYLATE 10 MG PO TABS: 10.0000 mg | ORAL_TABLET | Freq: Every day | ORAL | Status: DC

## 2013-11-01 MED ORDER — LISINOPRIL 40 MG PO TABS: 40.0000 mg | ORAL_TABLET | Freq: Every day | ORAL | Status: DC

## 2013-11-01 MED ORDER — CLONIDINE HCL 0.1 MG PO TABS
0.1000 mg | ORAL_TABLET | Freq: Once | ORAL | Status: AC
  Administered 2013-11-01: 0.1 mg via ORAL

## 2013-11-01 NOTE — Progress Notes (Signed)
[name] is a 48 y.o. female who presents to the [facility] today for blood pressure follow up. Her concerns today include:  HPI:  Hypertension BP Readings from Last 3 Encounters:  11/01/13 210/127  10/02/12 144/80  09/15/12 140/84   Home BP monitoring-no Compliant with medications- No ROS-Denies any CP, HA, blurry vision, LE edema, transient weakness, orthopnea, PND. SOB 2/2 COPD/asthma at baseline.  Longstanding, several year history. Severe range BP today. Patient reports being off her medications for the past 6 months for financial reasons. Was previously well controlled on carvedilol 12.5mg  daily, lisinopril 40mg  daily, and amlodipine 10mg  daily. Patient also endorses smoking crack as recently as yesterday.  Substance Abuse Patient endorses smoking crack as recently as yesterday and reports a desire to stop. Says that she has previously been on a medication in the past that helped relieve cravings.  COPD Patient continues to smoke 1/2ppd. Has had increased shortness of breath for several months. Denies increased sputum. No fevers or chills. Uses albuterol inhaler which helps moderately.  Depression Patient endorses long standing history of depression. Currently only on abilify. Reports having good success with Zoloft in the past. PHQ-9 score of 24 today with symptoms making life "very difficult." Patient denies suicidal ideation.   ROS: As per HPI, otherwise all systems reviewed and are negative.  Past Medical History - Reviewed and updated Patient Active Problem List   Diagnosis Date Noted  . Substance abuse 11/01/2013  . Obesity 10/02/2012  . Chronic diastolic CHF (congestive heart failure) 04/07/2012  . Allergic rhinitis 03/29/2011  . Unspecified episodic mood disorder 05/02/2010  . GERD 01/26/2010  . MIGRAINE HEADACHE 10/25/2009  . DYSFUNCTIONAL UTERINE BLEEDING 10/25/2009  . ALOPECIA 06/14/2009  . Hypertension 08/04/2008  . OBSTRUCTIVE SLEEP APNEA 02/03/2008  .  FIBROCYSTIC BREAST DISEASE 10/28/2006  . HYPERLIPIDEMIA 03/27/2006  . OBESITY, NOS 03/27/2006  . TOBACCO DEPENDENCE 03/27/2006  . Depression 03/27/2006  . NEUROGENIC BLADDER 03/27/2006  . COPD 03/27/2006    Medications- reviewed and updated Current Outpatient Prescriptions  Medication Sig Dispense Refill  . albuterol (PROVENTIL HFA;VENTOLIN HFA) 108 (90 BASE) MCG/ACT inhaler Inhale 2 puffs into the lungs every 6 (six) hours as needed. For shortness of breath  1 Inhaler  3  . amLODipine (NORVASC) 10 MG tablet Take 1 tablet (10 mg total) by mouth daily. For blood pressure.  30 tablet  5  . ARIPiprazole (ABILIFY) 10 MG tablet Take 1 tablet (10 mg total) by mouth daily. Appointment needed before further refills.  30 tablet  5  . carvedilol (COREG) 12.5 MG tablet TAKE 1&1/2 TABLETS TWICE A DAY WITH FOOD.  90 tablet  5  . Cetirizine HCl 10 MG CAPS Take 1 capsule (10 mg total) by mouth every morning.  30 capsule  11  . esomeprazole (NEXIUM) 20 MG capsule Take 1 capsule (20 mg total) by mouth daily before breakfast.  30 capsule  11  . furosemide (LASIX) 20 MG tablet Take 1 tablet (20 mg total) by mouth daily.  30 tablet  5  . hydrochlorothiazide (MICROZIDE) 12.5 MG capsule Take 12.5 mg by mouth daily.      [name] Kitchen lisinopril (PRINIVIL,ZESTRIL) 40 MG tablet Take 1 tablet (40 mg total) by mouth daily. For blood pressure  30 tablet  5  . NON FORMULARY Uses a C-PAP at bedtime      . pravastatin (PRAVACHOL) 40 MG tablet Take 1 tablet (40 mg total) by mouth daily. $4 [employer] list.  30 tablet  11  . [  DISCONTINUED] sucralfate (CARAFATE) 1 G tablet Take 1 tablet (1 g total) by mouth daily.  30 tablet  6   No current facility-administered medications for this visit.    Objective: Physical Exam: BP 210/127  Pulse 93  Temp(Src) 98.6 F (37 C) (Oral)  Wt 319 lb (144.697 kg)  SpO2 93%  LMP 04/10/2006  Gen: Visibly dyspneic sitting in chair CV: RRR with no murmurs appreciated. Distal pulses 2+  bilaterally Lungs: Mildly tachypneic, CTAB with no appreciated crackles, wheezes, or rhonchi Abdomen: Normal bowel sounds present. Soft, Nontender, Nondistended. Ext: no edema or cyanosis Skin: warm, dry Neuro: grossly normal, moves all extremities Psych: No SI/HI, "depressed" mood though with labile and elevated affect  No results found for this or any previous visit (from the past 72 hour(s)).  EKG: NSR, no acute ischemic changes. No significant change compared to prior EKG.  A/P: See problem list  Hypertension Severe range HTN today (210/127) in setting of medication noncomplicance. Asymptomatic. EKG showed NSR with no ischemic changes. Patient was given 0.1mg  clonidine PO in clinic, however had to leave 15 minutes afterwards due to needing to take her son to work. BP check at that time was 200/115. Will restart lisinopril and amlodipine today. Recheck BP in 2-3 weeks and can consider adding third agent, likely HCTZ, at that time. Will additionally obtain BMP today.   Substance abuse Patient openly endorsed recent substance use. Encouraged and urged patient to quit. Patient understood need to quit and expressed desire to stop. Handout for local NA appointments given. Will continue to discuss at future appointments.   COPD Suspect worsening of baseline status given constant dyspnea for the past several months. No increased sputum, fevers, chills, or acute changes to suggest acute exacerbation. Plan to set up spirometry and potentially start long acting bronchodilator at next meeting.  Depression Significant depressive symptoms today based in PHQ-9 score (24). No suicidal ideation. Did not have sufficient time to discuss management options today as patient had to leave to take son to work. Plan to re-evaluate at next appointment.     Orders Placed This Encounter  Procedures  . Basic Metabolic Panel  . EKG 12-Lead    Meds ordered this encounter  Medications  . cloNIDine (CATAPRES)  tablet 0.1 mg    Sig:   . lisinopril (PRINIVIL,ZESTRIL) 40 MG tablet    Sig: Take 1 tablet (40 mg total) by mouth daily. For blood pressure    Dispense:  30 tablet    Refill:  5  . amLODipine (NORVASC) 10 MG tablet    Sig: Take 1 tablet (10 mg total) by mouth daily. For blood pressure.    Dispense:  30 tablet    Refill:  Alondra [place]. Jerline Pain, [place] Resident PGY-1 11/01/2013 4:14 PM

## 2013-11-01 NOTE — Assessment & Plan Note (Signed)
Suspect worsening of baseline status given constant dyspnea for the past several months. No increased sputum, fevers, chills, or acute changes to suggest acute exacerbation. Plan to set up spirometry and potentially start long acting bronchodilator at next meeting.

## 2013-11-01 NOTE — Patient Instructions (Addendum)
Thank you for coming to the clinic today. It was nice seeing you.  For your blood pressure, please restart your lisinopril and amlodipine. DO NOT restart carvedilol at this time. These medications have been sent to your pharmacy. We did an EKG today which showed that you did not have any damage to your heart. We will draw some labs today to make sure your kidneys are functioning properly.   For your substance abuse, you can attend one of the meeting in the handout I have given you. We can discuss this more at our next meeting. Stopping substances and smoking will help with your blood pressure.  For your breathing, I would like for you to have a spirometry test done soon. We can also talk about adding new medications if your breathing does not improve.    See you again in 2-3 weeks.

## 2013-11-01 NOTE — Assessment & Plan Note (Signed)
Patient openly endorsed recent substance use. Encouraged and urged patient to quit. Patient understood need to quit and expressed desire to stop. Handout for local NA appointments given. Will continue to discuss at future appointments.

## 2013-11-01 NOTE — Assessment & Plan Note (Signed)
Severe range HTN today (210/127) in setting of medication noncomplicance. Asymptomatic. EKG showed NSR with no ischemic changes. Patient was given 0.1mg  clonidine PO in clinic, however had to leave 15 minutes afterwards due to needing to take her son to work. BP check at that time was 200/115. Will restart lisinopril and amlodipine today. Recheck BP in 2-3 weeks and can consider adding third agent, likely HCTZ, at that time. Will additionally obtain BMP today.

## 2013-11-01 NOTE — Progress Notes (Deleted)
  Subjective:     [name] is a 48 y.o. female and is here for a comprehensive physical exam. The patient reports {problems:16946}.  History   Social History  . Marital Status: Divorced    Spouse Name: N/A    Number of Children: N/A  . Years of Education: N/A   Occupational History  . Not on file.   Social History Main Topics  . Smoking status: Current Every Day Smoker -- 0.50 packs/day for 30 years    Types: Cigarettes  . Smokeless tobacco: Never Used  . Alcohol Use: Yes     Comment: occasionally - about every 3 weeks  . Drug Use: Yes    Special: Marijuana     Comment: occasionally - once/month  . Sexual Activity: No   Other Topics Concern  . Not on file   Social History Narrative  . No narrative on file   Health Maintenance  Topic Date Due  . Mammogram  07/25/2013  . Colonoscopy  07/25/2013  . Influenza Vaccine  08/28/2013  . Pap Smear  03/14/2014  . Tetanus/tdap  05/29/2014    {Common ambulatory SmartLinks:19316}  Review of Systems {ros; complete:30496}   Objective:    {Exam, Complete:[id number]}    Assessment:    Healthy female exam. ***     Plan:     See After Visit Summary for Counseling Recommendations

## 2013-11-01 NOTE — Assessment & Plan Note (Signed)
Significant depressive symptoms today based in PHQ-9 score (24). No suicidal ideation. Did not have sufficient time to discuss management options today as patient had to leave to take son to work. Plan to re-evaluate at next appointment.

## 2013-11-02 ENCOUNTER — Telehealth: Payer: Self-pay | Admitting: Family Medicine

## 2013-11-02 LAB — BASIC METABOLIC PANEL
BUN: 11 mg/dL (ref 6–23)
CALCIUM: 9.6 mg/dL (ref 8.4–10.5)
CO2: 27 mEq/L (ref 19–32)
CREATININE: 1.63 mg/dL — AB (ref 0.50–1.10)
Chloride: 106 mEq/L (ref 96–112)
Glucose, Bld: 73 mg/dL (ref 70–99)
Potassium: 4.3 mEq/L (ref 3.5–5.3)
Sodium: 140 mEq/L (ref 135–145)

## 2013-11-03 NOTE — Telephone Encounter (Signed)
Pt is aware of this and follow up made. Jazmin Hartsell,CMA

## 2013-11-19 ENCOUNTER — Encounter: Payer: Self-pay | Admitting: Family Medicine

## 2013-11-19 ENCOUNTER — Ambulatory Visit (INDEPENDENT_AMBULATORY_CARE_PROVIDER_SITE_OTHER): Payer: Medicare Other | Admitting: Family Medicine

## 2013-11-19 VITALS — BP 180/102 | HR 90 | Temp 98.5°F | Wt 321.0 lb

## 2013-11-19 DIAGNOSIS — J309 Allergic rhinitis, unspecified: Secondary | ICD-10-CM

## 2013-11-19 DIAGNOSIS — J449 Chronic obstructive pulmonary disease, unspecified: Secondary | ICD-10-CM | POA: Diagnosis not present

## 2013-11-19 DIAGNOSIS — K219 Gastro-esophageal reflux disease without esophagitis: Secondary | ICD-10-CM

## 2013-11-19 DIAGNOSIS — I10 Essential (primary) hypertension: Secondary | ICD-10-CM

## 2013-11-19 DIAGNOSIS — F329 Major depressive disorder, single episode, unspecified: Secondary | ICD-10-CM | POA: Diagnosis not present

## 2013-11-19 DIAGNOSIS — F32A Depression, unspecified: Secondary | ICD-10-CM

## 2013-11-19 LAB — BASIC METABOLIC PANEL
BUN: 9 mg/dL (ref 6–23)
CHLORIDE: 105 meq/L (ref 96–112)
CO2: 24 mEq/L (ref 19–32)
Calcium: 9.5 mg/dL (ref 8.4–10.5)
Creat: 1.3 mg/dL — ABNORMAL HIGH (ref 0.50–1.10)
Glucose, Bld: 69 mg/dL — ABNORMAL LOW (ref 70–99)
POTASSIUM: 4.4 meq/L (ref 3.5–5.3)
SODIUM: 139 meq/L (ref 135–145)

## 2013-11-19 MED ORDER — TIOTROPIUM BROMIDE MONOHYDRATE 18 MCG IN CAPS: 18.0000 ug | ORAL_CAPSULE | Freq: Every day | RESPIRATORY_TRACT | Status: DC

## 2013-11-19 MED ORDER — HYDROCHLOROTHIAZIDE 25 MG PO TABS: 25.0000 mg | ORAL_TABLET | Freq: Every day | ORAL | Status: DC

## 2013-11-19 MED ORDER — ESOMEPRAZOLE MAGNESIUM 20 MG PO CPDR: 20.0000 mg | DELAYED_RELEASE_CAPSULE | Freq: Every day | ORAL | Status: DC

## 2013-11-19 MED ORDER — SERTRALINE HCL 50 MG PO TABS: 50.0000 mg | ORAL_TABLET | Freq: Every day | ORAL | Status: DC

## 2013-11-19 MED ORDER — CETIRIZINE HCL 10 MG PO CAPS: 1.0000 | ORAL_CAPSULE | ORAL | Status: DC

## 2013-11-19 NOTE — Assessment & Plan Note (Signed)
Refilled patient's cetrizine.

## 2013-11-19 NOTE — Assessment & Plan Note (Addendum)
Improved today, though still above goal. Will start HCTZ and obtain BMP given her elevated creatinine at last visit. Will follow up in 2 weeks for blood pressure check.

## 2013-11-19 NOTE — Assessment & Plan Note (Signed)
Refilled nexium 

## 2013-11-19 NOTE — Progress Notes (Signed)
Patient ID: [name], female   DOB: [birth year], 48 y.o.   MRN: [id number]    [name] is a 48 y.o. female who presents to the [facility] today with a chief complaint of blood pressure follow up. Her concerns today include:  HPI: Hypertension BP Readings from Last 3 Encounters:  11/19/13 180/102  11/01/13 210/127  10/02/12 144/80    Started lisinopril and amlodipine at last visit 3 weeks ago.  Home BP monitoring-no Compliant with medications-yes without side effects ROS-Denies any CP, HA, SOB, blurry vision, LE edema, transient weakness, orthopnea, PND.   Dyspnea Patient reports worsening dyspnea on exertion for the past several months. Says that she now becomes short of breath after taking a few steps. Associated with coughing and some clear sputum production. Denies fevers or chills. No associated chest pain, arm pain, or jaw pain. Has been trying albuterol at home without much relief. Patient has been diagnosed with COPD in the past, but has no prior spirometry. Has been smoking since 12 from 1.5 to 0.5 packs per day more recently  Depression PHQ-9 with score of 24 at last visit without SI. No intervention at that time given the acuity of her other problems. Has previously tried several antidepressants without much significant improvement. Has additionally tried abilify without much relief.   Allergies Increased coughing and sneezing. Patient reports that she is "allergic to everthing."  GERD Has been taking ranitidine at home without much relief.   ROS: As per HPI, otherwise all systems reviewed and are negative.  Past Medical History - Reviewed and updated Patient Active Problem List   Diagnosis Date Noted  . Substance abuse 11/01/2013  . Obesity 10/02/2012  . Chronic diastolic CHF (congestive heart failure) 04/07/2012  . Allergic rhinitis 03/29/2011  . Unspecified episodic mood disorder 05/02/2010  . GERD 01/26/2010  . MIGRAINE HEADACHE 10/25/2009  . DYSFUNCTIONAL  UTERINE BLEEDING 10/25/2009  . ALOPECIA 06/14/2009  . Hypertension 08/04/2008  . OBSTRUCTIVE SLEEP APNEA 02/03/2008  . FIBROCYSTIC BREAST DISEASE 10/28/2006  . HYPERLIPIDEMIA 03/27/2006  . OBESITY, NOS 03/27/2006  . TOBACCO DEPENDENCE 03/27/2006  . Depression 03/27/2006  . NEUROGENIC BLADDER 03/27/2006  . COPD (chronic obstructive pulmonary disease) 03/27/2006    Medications- reviewed and updated Current Outpatient Prescriptions  Medication Sig Dispense Refill  . albuterol (PROVENTIL HFA;VENTOLIN HFA) 108 (90 BASE) MCG/ACT inhaler Inhale 2 puffs into the lungs every 6 (six) hours as needed. For shortness of breath  1 Inhaler  3  . amLODipine (NORVASC) 10 MG tablet Take 1 tablet (10 mg total) by mouth daily. For blood pressure.  30 tablet  5  . ARIPiprazole (ABILIFY) 10 MG tablet Take 1 tablet (10 mg total) by mouth daily. Appointment needed before further refills.  30 tablet  5  . carvedilol (COREG) 12.5 MG tablet TAKE 1&1/2 TABLETS TWICE A DAY WITH FOOD.  90 tablet  5  . Cetirizine HCl 10 MG CAPS Take 1 capsule (10 mg total) by mouth every morning.  30 capsule  11  . esomeprazole (NEXIUM) 20 MG capsule Take 1 capsule (20 mg total) by mouth daily before breakfast.  30 capsule  11  . furosemide (LASIX) 20 MG tablet Take 1 tablet (20 mg total) by mouth daily.  30 tablet  5  . hydrochlorothiazide (HYDRODIURIL) 25 MG tablet Take 1 tablet (25 mg total) by mouth daily.  90 tablet  3  . lisinopril (PRINIVIL,ZESTRIL) 40 MG tablet Take 1 tablet (40 mg total) by mouth daily. For blood  pressure  30 tablet  5  . NON FORMULARY Uses a C-PAP at bedtime      . pravastatin (PRAVACHOL) 40 MG tablet Take 1 tablet (40 mg total) by mouth daily. $4 [employer] list.  30 tablet  11  . sertraline (ZOLOFT) 50 MG tablet Take 1 tablet (50 mg total) by mouth daily.  30 tablet  3  . tiotropium (SPIRIVA) 18 MCG inhalation capsule Place 1 capsule (18 mcg total) into inhaler and inhale daily.  30 capsule  12  .  [DISCONTINUED] sucralfate (CARAFATE) 1 G tablet Take 1 tablet (1 g total) by mouth daily.  30 tablet  6   No current facility-administered medications for this visit.    Objective: Physical Exam: BP 180/102  Pulse 90  Temp(Src) 98.5 F (36.9 C) (Oral)  Wt 321 lb (145.605 kg)  SpO2 97%  LMP 04/10/2006  Gen: NAD, resting comfortably CV: RRR with no murmurs appreciated Lungs: NWOB, Decreased air movement, but CTAB with no crackles, wheezes, or rhonchi Abdomen: Normal bowel sounds present. Soft, Nontender, Nondistended. Ext: no edema Skin: warm, dry Neuro: grossly normal, moves all extremities  No results found for this or any previous visit (from the past 72 hour(s)).  A/P: See problem list  Hypertension Improved today, though still above goal. Will start HCTZ and obtain BMP given her elevated creatinine at last visit. Will follow up in 2 weeks for blood pressure check.   COPD (chronic obstructive pulmonary disease) Continues to have DOE without chest pain. Will start spiriva today and refer patient to pharmacy clinic to have spirometry.  Allergic rhinitis Refilled patient's cetrizine.  Depression PHQ-9 of 24 without SI at last visit. Will start zoloft today and plan to add abilify if not improving symptoms.   GERD Refilled nexium.    Orders Placed This Encounter  Procedures  . Basic Metabolic Panel    Meds ordered this encounter  Medications  . hydrochlorothiazide (HYDRODIURIL) 25 MG tablet    Sig: Take 1 tablet (25 mg total) by mouth daily.    Dispense:  90 tablet    Refill:  3  . tiotropium (SPIRIVA) 18 MCG inhalation capsule    Sig: Place 1 capsule (18 mcg total) into inhaler and inhale daily.    Dispense:  30 capsule    Refill:  12  . sertraline (ZOLOFT) 50 MG tablet    Sig: Take 1 tablet (50 mg total) by mouth daily.    Dispense:  30 tablet    Refill:  3  . Cetirizine HCl 10 MG CAPS    Sig: Take 1 capsule (10 mg total) by mouth every morning.     Dispense:  30 capsule    Refill:  11  . esomeprazole (NEXIUM) 20 MG capsule    Sig: Take 1 capsule (20 mg total) by mouth daily before breakfast.    Dispense:  30 capsule    Refill:  Carlos M. Jerline Pain, [place] Resident PGY-1 11/19/2013 3:02 PM

## 2013-11-19 NOTE — Assessment & Plan Note (Signed)
Continues to have DOE without chest pain. Will start spiriva today and refer patient to pharmacy clinic to have spirometry.

## 2013-11-19 NOTE — Assessment & Plan Note (Signed)
PHQ-9 of 24 without SI at last visit. Will start zoloft today and plan to add abilify if not improving symptoms.

## 2013-11-19 NOTE — Patient Instructions (Signed)
Thank you for coming to the clinic today. It was nice seeing you.  For your blood pressure, it is improving. We will start a new medication today called HCTZ. Take one pill per day. We will also check your kidney numbers again today.  For your breathing, we will start a medication called Spiriva. Take this medication one time per day. Also use the albuterol as needed. We also like to set you up for spirometry, which is a test to look at your lung function.  For your depression, we will start Zoloft today. This medication takes several weeks to have an effect. We can increase the dose as needed.  For your reflux, we refilled your nexium today.  For your allergies, we refilled your ceterizine today.   See you again in a couple weeks for blood pressure check and for spirometry. Please call or come in sooner if you need anything else.

## 2013-11-29 ENCOUNTER — Ambulatory Visit (INDEPENDENT_AMBULATORY_CARE_PROVIDER_SITE_OTHER): Payer: Medicare Other | Admitting: *Deleted

## 2013-11-29 ENCOUNTER — Encounter: Payer: Self-pay | Admitting: Family Medicine

## 2013-11-29 ENCOUNTER — Ambulatory Visit (INDEPENDENT_AMBULATORY_CARE_PROVIDER_SITE_OTHER): Payer: Medicare Other | Admitting: Family Medicine

## 2013-11-29 VITALS — BP 163/107 | HR 95 | Temp 98.1°F | Wt 319.0 lb

## 2013-11-29 DIAGNOSIS — I10 Essential (primary) hypertension: Secondary | ICD-10-CM

## 2013-11-29 DIAGNOSIS — Z23 Encounter for immunization: Secondary | ICD-10-CM | POA: Diagnosis not present

## 2013-11-29 DIAGNOSIS — J449 Chronic obstructive pulmonary disease, unspecified: Secondary | ICD-10-CM

## 2013-11-29 NOTE — Patient Instructions (Signed)
Thank you for coming to the clinic today. It was nice seeing you.  For your blood pressure, start taking the amlodipine today. Your kidney numbers and your blood pressure look better. We can recheck your blood pressure in 1-2 weeks when you come back in for your lung tests. If your blood pressure is still high at that appointment, we will call you to start one of the other medications.  Please ask the front desk to schedule an appointment for PFTs and a blood pressure check. You can also schedule a separate appointment for the pap smear.  See you again at your next appointment.

## 2013-11-29 NOTE — Assessment & Plan Note (Signed)
BP improved today to 163/107 without pharmacologic intervention (pt has not picked up previous prescriptions until this morning). Will restart amlodipine and recheck in 1-2 weeks at PFT appointment. Anticipate addition of another agent at that time if blood pressure remains in hypertensive range.

## 2013-11-29 NOTE — Assessment & Plan Note (Signed)
No additional concerns at this time. Instructed patient to take Spiriva device to clinic or to her pharmacy to ensure that it is working properly. Will continue albuterol and spiriva at this time.  Will have PFTs in the next 1-2 weeks.

## 2013-11-29 NOTE — Progress Notes (Signed)
[name] is a 48 y.o. female who presents to the [facility] today with a chief complaint of blood pressure follow up. Her concerns today include:  HPI:  Hypertension BP Readings from Last 3 Encounters:  11/29/13 163/107  11/19/13 180/102  11/01/13 210/127   Home BP monitoring-No Compliant with medications-Has not taken any blood pressure medications- Only filled her prescriptions this morning. Has been prescribed HCTZ, lisinopril, and amlodipine. ROS-Denies any CP, HA, blurry vision, LE edema, transient weakness, orthopnea, PND.     COPD Dyspnea is about the same. Just picked up her Spiriva prescription this morning, and reports some difficulty operating the device, though forgot to bring it with her to clinic. No fevers or chills. No increased work of breathing   ROS: As per HPI, otherwise all systems reviewed and are negative.  Past Medical History - Reviewed and updated Patient Active Problem List   Diagnosis Date Noted  . Substance abuse 11/01/2013  . Obesity 10/02/2012  . Chronic diastolic CHF (congestive heart failure) 04/07/2012  . Allergic rhinitis 03/29/2011  . Unspecified episodic mood disorder 05/02/2010  . GERD 01/26/2010  . MIGRAINE HEADACHE 10/25/2009  . DYSFUNCTIONAL UTERINE BLEEDING 10/25/2009  . ALOPECIA 06/14/2009  . Hypertension 08/04/2008  . OBSTRUCTIVE SLEEP APNEA 02/03/2008  . FIBROCYSTIC BREAST DISEASE 10/28/2006  . HYPERLIPIDEMIA 03/27/2006  . OBESITY, NOS 03/27/2006  . TOBACCO DEPENDENCE 03/27/2006  . Depression 03/27/2006  . NEUROGENIC BLADDER 03/27/2006  . COPD (chronic obstructive pulmonary disease) 03/27/2006    Medications- reviewed and updated Current Outpatient Prescriptions  Medication Sig Dispense Refill  . albuterol (PROVENTIL HFA;VENTOLIN HFA) 108 (90 BASE) MCG/ACT inhaler Inhale 2 puffs into the lungs every 6 (six) hours as needed. For shortness of breath 1 Inhaler 3  . amLODipine (NORVASC) 10 MG tablet Take 1 tablet (10 mg  total) by mouth daily. For blood pressure. 30 tablet 5  . ARIPiprazole (ABILIFY) 10 MG tablet Take 1 tablet (10 mg total) by mouth daily. Appointment needed before further refills. 30 tablet 5  . Cetirizine HCl 10 MG CAPS Take 1 capsule (10 mg total) by mouth every morning. 30 capsule 11  . esomeprazole (NEXIUM) 20 MG capsule Take 1 capsule (20 mg total) by mouth daily before breakfast. 30 capsule 11  . furosemide (LASIX) 20 MG tablet Take 1 tablet (20 mg total) by mouth daily. 30 tablet 5  . hydrochlorothiazide (HYDRODIURIL) 25 MG tablet Take 1 tablet (25 mg total) by mouth daily. 90 tablet 3  . lisinopril (PRINIVIL,ZESTRIL) 40 MG tablet Take 1 tablet (40 mg total) by mouth daily. For blood pressure 30 tablet 5  . NON FORMULARY Uses a C-PAP at bedtime    . pravastatin (PRAVACHOL) 40 MG tablet Take 1 tablet (40 mg total) by mouth daily. $4 [employer] list. 30 tablet 11  . sertraline (ZOLOFT) 50 MG tablet Take 1 tablet (50 mg total) by mouth daily. 30 tablet 3  . tiotropium (SPIRIVA) 18 MCG inhalation capsule Place 1 capsule (18 mcg total) into inhaler and inhale daily. 30 capsule 12  . [DISCONTINUED] sucralfate (CARAFATE) 1 G tablet Take 1 tablet (1 g total) by mouth daily. 30 tablet 6   No current facility-administered medications for this visit.    Objective: Physical Exam: BP 163/107 mmHg  Pulse 95  Temp(Src) 98.1 F (36.7 C) (Oral)  Wt 319 lb (144.697 kg)  SpO2 96%  LMP 04/10/2006  Gen: NAD, resting comfortably CV: RRR with no murmurs appreciated Lungs: NWOB, CTAB with no crackles, wheezes,  or rhonchi. Good airflow Abdomen: Normal bowel sounds present. Soft, Nontender, Nondistended. Ext: no edema Skin: warm, dry Neuro: grossly normal, moves all extremities  No results found for this or any previous visit (from the past 72 hour(s)).  A/P: See problem list  Hypertension BP improved today to 163/107 without pharmacologic intervention (pt has not picked up previous prescriptions  until this morning). Will restart amlodipine and recheck in 1-2 weeks at PFT appointment. Anticipate addition of another agent at that time if blood pressure remains in hypertensive range.   COPD (chronic obstructive pulmonary disease) No additional concerns at this time. Instructed patient to take Spiriva device to clinic or to her pharmacy to ensure that it is working properly. Will continue albuterol and spiriva at this time.  Will have PFTs in the next 1-2 weeks.     No orders of the defined types were placed in this encounter.    No orders of the defined types were placed in this encounter.     Algis Greenhouse. Jerline Pain, [place] Resident PGY-1 11/29/2013 3:17 PM

## 2013-12-06 ENCOUNTER — Ambulatory Visit: Payer: Medicare Other | Admitting: Pharmacist

## 2013-12-16 ENCOUNTER — Ambulatory Visit: Payer: Medicare Other | Admitting: Family Medicine

## 2014-04-07 ENCOUNTER — Other Ambulatory Visit: Payer: Self-pay | Admitting: Family Medicine

## 2014-04-07 ENCOUNTER — Ambulatory Visit (INDEPENDENT_AMBULATORY_CARE_PROVIDER_SITE_OTHER): Payer: Medicare Other | Admitting: Family Medicine

## 2014-04-07 ENCOUNTER — Encounter: Payer: Self-pay | Admitting: Family Medicine

## 2014-04-07 VITALS — BP 150/92 | HR 88 | Temp 98.4°F | Ht 70.0 in | Wt 326.0 lb

## 2014-04-07 DIAGNOSIS — F329 Major depressive disorder, single episode, unspecified: Secondary | ICD-10-CM

## 2014-04-07 DIAGNOSIS — I10 Essential (primary) hypertension: Secondary | ICD-10-CM

## 2014-04-07 DIAGNOSIS — M549 Dorsalgia, unspecified: Secondary | ICD-10-CM | POA: Insufficient documentation

## 2014-04-07 DIAGNOSIS — M545 Low back pain, unspecified: Secondary | ICD-10-CM

## 2014-04-07 DIAGNOSIS — F32A Depression, unspecified: Secondary | ICD-10-CM

## 2014-04-07 DIAGNOSIS — E329 Disease of thymus, unspecified: Secondary | ICD-10-CM | POA: Diagnosis not present

## 2014-04-07 LAB — BASIC METABOLIC PANEL
BUN: 11 mg/dL (ref 6–23)
CALCIUM: 9.2 mg/dL (ref 8.4–10.5)
CO2: 22 mEq/L (ref 19–32)
CREATININE: 1.27 mg/dL — AB (ref 0.50–1.10)
Chloride: 109 mEq/L (ref 96–112)
Glucose, Bld: 93 mg/dL (ref 70–99)
POTASSIUM: 4.1 meq/L (ref 3.5–5.3)
Sodium: 141 mEq/L (ref 135–145)

## 2014-04-07 LAB — TSH: TSH: 0.224 u[IU]/mL — ABNORMAL LOW (ref 0.350–4.500)

## 2014-04-07 LAB — CBC
HCT: 44.2 % (ref 36.0–46.0)
Hemoglobin: 15.2 g/dL — ABNORMAL HIGH (ref 12.0–15.0)
MCH: 29.6 pg (ref 26.0–34.0)
MCHC: 34.4 g/dL (ref 30.0–36.0)
MCV: 86.2 fL (ref 78.0–100.0)
MPV: 10.6 fL (ref 8.6–12.4)
Platelets: 325 10*3/uL (ref 150–400)
RBC: 5.13 MIL/uL — ABNORMAL HIGH (ref 3.87–5.11)
RDW: 13.8 % (ref 11.5–15.5)
WBC: 9.6 10*3/uL (ref 4.0–10.5)

## 2014-04-07 MED ORDER — DICLOFENAC SODIUM 1 % TD GEL: 2.0000 g | Freq: Four times a day (QID) | TRANSDERMAL | Status: DC

## 2014-04-07 MED ORDER — ARIPIPRAZOLE 15 MG PO TABS: 15.0000 mg | ORAL_TABLET | Freq: Every day | ORAL | Status: DC

## 2014-04-07 MED ORDER — SERTRALINE HCL 100 MG PO TABS: 100.0000 mg | ORAL_TABLET | Freq: Every day | ORAL | Status: DC

## 2014-04-07 MED ORDER — ACETAMINOPHEN 500 MG PO TABS: 500.0000 mg | ORAL_TABLET | Freq: Four times a day (QID) | ORAL | Status: DC

## 2014-04-07 NOTE — Assessment & Plan Note (Signed)
Chronic, previously seen at pain clinic. Instructed patient to stop daily ibuprofen given lack of effectiveness in addition to deleterious effects on cardiovascular health and increased risk for peptic ulcers/gastritis. Will proceed with scheduled tylenol and voltaren gel. Limited options at this point as patient does not wish to go back to pain clinic.

## 2014-04-07 NOTE — Patient Instructions (Addendum)
Thank you for coming to the clinic today. It was nice seeing you.  For your depression, we will increase your zoloft to 100mg  per day. We will also increase your abilify to 15mg  per day. If you have any thoughts of killing or hurting yourself, please call the suicide hotline 1 [phone] 650-366-6143 or call 911.  For your back pain, stop taking the ibuprofen. You should take 500mg  of acetaminophen every 6 hours. We will also give you a prescription for voltaren gel. You can use this on your back up to 4 times per day.  Please remember to take your blood pressure medications.  We will see you again in 2-3 weeks for follow up.

## 2014-04-07 NOTE — Assessment & Plan Note (Addendum)
Significant worsening of symptoms over the past few months. Patient has questionable periods of prior manic-like behavior, but no diagnosis of Bipolar I. Will increase Abilify to 15mg  daily and increase Zoloft to 100mg  daily. Do not anticipate precipitation of mania given prior tolerance of SSRIs and lack of firm Bipolar diagnosis, however will continue to monitor for this. Patient without current SI and is able to contract for safety. Patient stated that she would attempt to follow up with Dr Gwenlyn Saran in [facility]. Will follow up in 2 weeks.  Will check TSH today.

## 2014-04-07 NOTE — Progress Notes (Signed)
[name] is a 49 y.o. female who presents to the [facility] today with a chief complaint of depression. Her concerns today include:  HPI:  Depression: Patient with long standing history of depression. First diagnosed nearly 30 years ago.  Currently endorses worsening depressed mood, anhedonia, decreased sleep, and guilt for the past 2-3 months. Patient is currently on Zoloft and Abilify, however states that she does not really feel like they are helping. States that symptoms are making her life very difficult. Stays that she feels "lost" and confined to the house. States that she lays in bed all day. Reports that she wants to be out and that she is a "social" person, however has not done so recently. Reports that she "doesn't like" who she has become.  Patient reports being tried on several medications. She can't remember if any helped more than any others. Has only been diagnosed with depression in the past. Has been hospitalized twice, several years ago due to mental illness. Denies any past periods of manic symptoms. Patient does have significant substance abuse history. Last used crack 3 days ago. Reports that crack cocaine is the only drug she currently uses.   Endorses suicidal ideation while using crack. Currently denies SI/SIB/HI. States that when she felt that way she started thinking about her children and praying which made the thoughts go away. Has a son that will be graduating from college soon.   PHQ-9: 26, extremly difficult  Back Pain Long standing history. Previously seen in pain clinic, however no longer. Patient states that she does not wish to be given narcotics given her addiction history. Has been taking 4 ibuprofen in the morning and 4 ibuprofen at night. Can not tell if ibuprofen is making a difference. No bowel or bladder incontinence. No lower extremity weakness.  Hypertension Patient reports good compliance to medications however did not take medications this  morning. Is currently on amlodipine, HCTZ and lisinopril. No medication side effects noted.   ROS-Denies any CP, HA, SOB, blurry vision, LE edema, transient weakness, orthopnea, PND.   ROS: As per HPI, otherwise all systems reviewed and are negative.  Past Medical History - Reviewed and updated Patient Active Problem List   Diagnosis Date Noted  . Back pain 04/07/2014  . Substance abuse 11/01/2013  . Chronic diastolic CHF (congestive heart failure) 04/07/2012  . Allergic rhinitis 03/29/2011  . GERD 01/26/2010  . MIGRAINE HEADACHE 10/25/2009  . DYSFUNCTIONAL UTERINE BLEEDING 10/25/2009  . ALOPECIA 06/14/2009  . Hypertension 08/04/2008  . OBSTRUCTIVE SLEEP APNEA 02/03/2008  . FIBROCYSTIC BREAST DISEASE 10/28/2006  . HYPERLIPIDEMIA 03/27/2006  . OBESITY, NOS 03/27/2006  . TOBACCO DEPENDENCE 03/27/2006  . Depression 03/27/2006  . NEUROGENIC BLADDER 03/27/2006  . COPD (chronic obstructive pulmonary disease) 03/27/2006    Medications- reviewed and updated Current Outpatient Prescriptions  Medication Sig Dispense Refill  . acetaminophen (TYLENOL) 500 MG tablet Take 1 tablet (500 mg total) by mouth every 6 (six) hours. 120 tablet 3  . albuterol (PROVENTIL HFA;VENTOLIN HFA) 108 (90 BASE) MCG/ACT inhaler Inhale 2 puffs into the lungs every 6 (six) hours as needed. For shortness of breath 1 Inhaler 3  . amLODipine (NORVASC) 10 MG tablet Take 1 tablet (10 mg total) by mouth daily. For blood pressure. 30 tablet 5  . ARIPiprazole (ABILIFY) 15 MG tablet Take 1 tablet (15 mg total) by mouth daily. 30 tablet 3  . Cetirizine HCl 10 MG CAPS Take 1 capsule (10 mg total) by mouth every morning. 30 capsule  11  . diclofenac sodium (VOLTAREN) 1 % GEL Apply 2 g topically 4 (four) times daily. 100 g 1  . esomeprazole (NEXIUM) 20 MG capsule Take 1 capsule (20 mg total) by mouth daily before breakfast. 30 capsule 11  . furosemide (LASIX) 20 MG tablet Take 1 tablet (20 mg total) by mouth daily. 30 tablet 5    . hydrochlorothiazide (HYDRODIURIL) 25 MG tablet Take 1 tablet (25 mg total) by mouth daily. 90 tablet 3  . lisinopril (PRINIVIL,ZESTRIL) 40 MG tablet Take 1 tablet (40 mg total) by mouth daily. For blood pressure 30 tablet 5  . NON FORMULARY Uses a C-PAP at bedtime    . pravastatin (PRAVACHOL) 40 MG tablet Take 1 tablet (40 mg total) by mouth daily. $4 [employer] list. 30 tablet 11  . sertraline (ZOLOFT) 100 MG tablet Take 1 tablet (100 mg total) by mouth daily. 30 tablet 3  . tiotropium (SPIRIVA) 18 MCG inhalation capsule Place 1 capsule (18 mcg total) into inhaler and inhale daily. 30 capsule 12  . [DISCONTINUED] sucralfate (CARAFATE) 1 G tablet Take 1 tablet (1 g total) by mouth daily. 30 tablet 6   No current facility-administered medications for this visit.    Objective: Physical Exam: BP 150/92 mmHg  Pulse 88  Temp(Src) 98.4 F (36.9 C) (Oral)  Ht 5\' 10"  (1.778 m)  Wt 326 lb (147.873 kg)  BMI 46.78 kg/m2  LMP 04/10/2006  Gen: NAD, resting comfortably CV: RRR with no murmurs appreciated Lungs: NWOB, CTAB with no crackles, wheezes, or rhonchi Abdomen: Normal bowel sounds present. Soft, Nontender, Nondistended. Ext: no edema Skin: warm, dry Neuro: grossly normal, moves all extremities  A/P:   Depression Significant worsening of symptoms over the past few months. Patient has questionable periods of prior manic-like behavior, but no diagnosis of Bipolar I. Will increase Abilify to 15mg  daily and increase Zoloft to 100mg  daily. Do not anticipate precipitation of mania given prior tolerance of SSRIs and lack of firm Bipolar diagnosis, however will continue to monitor for this. Patient without current SI and is able to contract for safety. Patient stated that she would attempt to follow up with Dr Gwenlyn Saran in [facility]. Will follow up in 2 weeks.  Will check TSH today.    Back pain Chronic, previously seen at pain clinic. Instructed patient to stop daily ibuprofen given lack of  effectiveness in addition to deleterious effects on cardiovascular health and increased risk for peptic ulcers/gastritis. Will proceed with scheduled tylenol and voltaren gel. Limited options at this point as patient does not wish to go back to pain clinic.    Hypertension Above goal today, though did not take medications this morning. Will continue amlodipine, HCTZ, and lisinopril. Follow up in 2 weeks.     Orders Placed This Encounter  Procedures  . CBC  . Basic metabolic panel  . TSH   Meds ordered this encounter  Medications  . ARIPiprazole (ABILIFY) 15 MG tablet    Sig: Take 1 tablet (15 mg total) by mouth daily.    Dispense:  30 tablet    Refill:  3  . sertraline (ZOLOFT) 100 MG tablet    Sig: Take 1 tablet (100 mg total) by mouth daily.    Dispense:  30 tablet    Refill:  3  . acetaminophen (TYLENOL) 500 MG tablet    Sig: Take 1 tablet (500 mg total) by mouth every 6 (six) hours.    Dispense:  120 tablet    Refill:  3  .  diclofenac sodium (VOLTAREN) 1 % GEL    Sig: Apply 2 g topically 4 (four) times daily.    Dispense:  100 g    Refill:  [place]. Jerline Pain, [place] Resident PGY-1 04/07/2014 6:21 PM

## 2014-04-07 NOTE — Assessment & Plan Note (Signed)
Above goal today, though did not take medications this morning. Will continue amlodipine, HCTZ, and lisinopril. Follow up in 2 weeks.

## 2014-04-08 ENCOUNTER — Encounter: Payer: Self-pay | Admitting: *Deleted

## 2014-04-08 ENCOUNTER — Telehealth: Payer: Self-pay | Admitting: Family Medicine

## 2014-04-08 DIAGNOSIS — R7989 Other specified abnormal findings of blood chemistry: Secondary | ICD-10-CM

## 2014-04-08 NOTE — Progress Notes (Signed)
Prior Authorization received from [facility] for [employer].  PA form placed in provider box for completion. Derl Barrow, RN

## 2014-04-08 NOTE — Telephone Encounter (Signed)
Low TSH. Will check free T4, Free T3.   Algis Greenhouse. Jerline Pain, [place] Resident PGY-1 04/08/2014 5:28 PM

## 2014-04-12 LAB — T3, FREE

## 2014-04-12 LAB — T4, FREE

## 2014-04-12 NOTE — Telephone Encounter (Signed)
Spoke to patient regarding results and she is aware of them.  [name] is able to add these on to her labs from last week.  Jazmin Hartsell,CMA

## 2014-04-13 ENCOUNTER — Telehealth: Payer: Self-pay | Admitting: Family Medicine

## 2014-04-13 NOTE — Telephone Encounter (Signed)
appt made for Friday at [facility]

## 2014-04-14 ENCOUNTER — Other Ambulatory Visit: Payer: Self-pay | Admitting: Family Medicine

## 2014-04-15 ENCOUNTER — Other Ambulatory Visit: Payer: Medicare Other

## 2014-04-15 DIAGNOSIS — R946 Abnormal results of thyroid function studies: Secondary | ICD-10-CM | POA: Diagnosis not present

## 2014-04-15 DIAGNOSIS — R7989 Other specified abnormal findings of blood chemistry: Secondary | ICD-10-CM

## 2014-04-15 LAB — T4, FREE: Free T4: 1.23 ng/dL (ref 0.80–1.80)

## 2014-04-15 LAB — T3, FREE: T3 FREE: 2.9 pg/mL (ref 2.3–4.2)

## 2014-04-15 NOTE — Progress Notes (Signed)
[facility] phlebotomist drew:  FREE T-4, FREE T-3

## 2014-04-18 ENCOUNTER — Telehealth: Payer: Self-pay | Admitting: Family Medicine

## 2014-04-18 ENCOUNTER — Encounter: Payer: Self-pay | Admitting: Family Medicine

## 2014-04-18 NOTE — Progress Notes (Signed)
[name] called to get more information on a pre-authorization that was sent.  Please call them at [phone] reference # [id number]

## 2014-04-19 NOTE — Telephone Encounter (Signed)
Advised pt as directed below and verbalized understanding. Carter Kassel, CMA. 

## 2014-04-19 NOTE — Progress Notes (Signed)
Will forward to RN pool. Ramil Edgington,CMA

## 2014-04-20 NOTE — Progress Notes (Signed)
Returned call to [place] regarding prior authorization.  Per [employer]--med has been approved x 1 year.  Burna Forts, BSN, RN-BC

## 2014-05-03 ENCOUNTER — Ambulatory Visit: Payer: Medicare Other | Admitting: Family Medicine

## 2014-05-24 ENCOUNTER — Ambulatory Visit: Payer: Medicare Other | Admitting: Family Medicine

## 2014-07-28 ENCOUNTER — Other Ambulatory Visit: Payer: Self-pay | Admitting: Family Medicine

## 2014-09-29 ENCOUNTER — Encounter: Payer: Self-pay | Admitting: Family Medicine

## 2014-09-29 ENCOUNTER — Ambulatory Visit (INDEPENDENT_AMBULATORY_CARE_PROVIDER_SITE_OTHER): Payer: Medicare Other | Admitting: Family Medicine

## 2014-09-29 ENCOUNTER — Other Ambulatory Visit: Payer: Self-pay | Admitting: Family Medicine

## 2014-09-29 VITALS — BP 129/70 | HR 107 | Temp 98.3°F | Ht 70.0 in | Wt 334.5 lb

## 2014-09-29 DIAGNOSIS — M545 Low back pain, unspecified: Secondary | ICD-10-CM | POA: Insufficient documentation

## 2014-09-29 DIAGNOSIS — Z23 Encounter for immunization: Secondary | ICD-10-CM

## 2014-09-29 MED ORDER — TRAMADOL HCL 50 MG PO TABS: 50.0000 mg | ORAL_TABLET | Freq: Three times a day (TID) | ORAL | Status: DC | PRN

## 2014-09-29 MED ORDER — MORPHINE SULFATE 10 MG/ML IJ SOLN
2.0000 mg | Freq: Once | INTRAMUSCULAR | Status: AC
  Administered 2014-09-29: 2 mg via INTRAMUSCULAR

## 2014-09-29 MED ORDER — GABAPENTIN 300 MG PO CAPS: 300.0000 mg | ORAL_CAPSULE | Freq: Three times a day (TID) | ORAL | Status: DC

## 2014-09-29 NOTE — Patient Instructions (Signed)
Thank you for coming to the clinic today. It was nice seeing you.  I think most of your pain is coming from your back. We will start 2 new medications today. Please take the gabapentin at night for 3 days. If this goes well, take 1 pill in the morning and 1 pill at night for 3 days. If this goes well, please take 1 pill three times a day. We will also give you tramadol to take as needed.  Please follow up in 2-3 weeks.  Take care,  Dr Jerline Pain

## 2014-09-29 NOTE — Progress Notes (Signed)
Subjective:  [name] is a 49 y.o. female [id number] presents to the [facility] today with a chief complaint of left sided pain.   HPI: Left sided pain Pain started 1 week ago. Located in her lower back radiating leftwards around her flank to her umbilicus. Also with some left hip pain. Pain described as a burning sensation with pins and needles. Hss never had pain like this before. Tried taking OTC BC powder, Alleve, and ibuprofen without significant relief. Pain rated as 10/10. No injuries or precipitating events. Patient has a history of degenerative back disease. No weakness. No fevers or chills. No rash. No history of kidney stones.    ROS: No nausea or vomiting, otherwise all systems reviewed and are negative  PMH:  The following were reviewed and entered/updated in epic: Past Medical History  Diagnosis Date  . Hyperlipidemia   . COPD (chronic obstructive pulmonary disease)   . Fibrocystic breast disease   . GERD (gastroesophageal reflux disease)   . Hypertension 04-10-11    had stop taking meds, started back 3 weeks ago, B/P remains elevated  . Cervical dysplasia or atypia 04-10-11    '93- once dx.-got pregnant-no intervention, then postpartum, no dysplasia found  . Asthma   . Sleep apnea 04-10-11    01-2008 sleep study report with chart-doesn't use cpap  . Migraine headache   . Arthritis 04-10-11    hips, shoulders, back  . Skin lesion 03/15/2011    In gluteal crease now s/p removal by Dr. [name] of General Surgery on 3/12. Path shows condyloma.     . 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 Pathology showed Condyloma, gluteal CONDYLOMA ACUMINATUM.    Patient Active Problem List   Diagnosis Date Noted  . Left low back pain 09/29/2014  . Back pain 04/07/2014  . Substance abuse 11/01/2013  . Chronic diastolic CHF (congestive heart failure) 04/07/2012  . Allergic rhinitis 03/29/2011  . GERD 01/26/2010  . MIGRAINE HEADACHE 10/25/2009  . DYSFUNCTIONAL  UTERINE BLEEDING 10/25/2009  . ALOPECIA 06/14/2009  . Hypertension 08/04/2008  . OBSTRUCTIVE SLEEP APNEA 02/03/2008  . FIBROCYSTIC BREAST DISEASE 10/28/2006  . HYPERLIPIDEMIA 03/27/2006  . OBESITY, NOS 03/27/2006  . TOBACCO DEPENDENCE 03/27/2006  . Depression 03/27/2006  . NEUROGENIC BLADDER 03/27/2006  . COPD (chronic obstructive pulmonary disease) 03/27/2006   Past Surgical History  Procedure Laterality Date  . Breast cyst excision      bilateral breast  . Neck surgery  04-10-11    x3- cervical fusion with plating and screws-Dr. [name]  . Back surgery  04-10-11    x5-Lumbar fusion-retained hardware.(Dr. Louanne Skye)  . Anal fistulectomy  04/17/2011    Procedure: FISTULECTOMY ANAL;  Surgeon: Imogene Burn. [name], MD;  Location: [facility];  Service: General;  Laterality: N/A;  Excision of Condyloma Gluteal Cleft   . Tee without cardioversion N/A 04/06/2012    Procedure: TRANSESOPHAGEAL ECHOCARDIOGRAM (TEE);  Surgeon: [name], MD;  Location: [facility] [facility];  Service: Cardiovascular;  Laterality: N/A;  . Doppler echocardiography  04/06/2012    AT [place] 55-60%      Objective:  Physical Exam: BP 129/70 mmHg  Pulse 107  Temp(Src) 98.3 F (36.8 C) (Oral)  Ht 5\' 10"  (1.778 m)  Wt 334 lb 8 oz (151.728 kg)  BMI 48.00 kg/m2  LMP 04/10/2006  Gen: NAD, resting comfortably CV: RRR with no murmurs appreciated Lungs: NWOB, CTAB with no crackles, wheezes, or rhonchi GI: Obese, Normal bowel  sounds present. Soft, Nontender, Nondistended. MSK:  - Back: Surgical scars at C-spine and L-spine noted. No gross stepoffs or deformities. Spinous processes nontender to palpation. No CVA tenderness. Tender to palpation along left flank into abdomen.  Skin: warm, dry Neuro: grossly normal, moves all extremities Psych: Normal affect and thought content   Assessment/Plan:  Left low back pain Pain consistent with neuropathic etiology. Concern for radicular symptoms originating from lower  thoracic spine given distribution of pain. Will treat gabapentin and tramadol. Will titrate up gabapentin to 300mg  tid. Will avoid narcotics given patient's substance abuse history. 2mg  of morphine given in office today. Discussed signs and symptoms of serotonin syndrome with patient - instructed her to contact office if develops symptoms. Will follow up in 2-3 weeks. Consider imaging if patient develops red flag signs or symptoms or if not improving at 6 weeks.   Precepted with Dr Pincus Large. Jerline Pain, [place] Resident PGY-2 09/29/2014 4:44 PM

## 2014-09-29 NOTE — Assessment & Plan Note (Signed)
Pain consistent with neuropathic etiology. Concern for radicular symptoms originating from lower thoracic spine given distribution of pain. Will treat gabapentin and tramadol. Will titrate up gabapentin to 300mg  tid. Will avoid narcotics given patient's substance abuse history. 2mg  of morphine given in office today. Discussed signs and symptoms of serotonin syndrome with patient - instructed her to contact office if develops symptoms. Will follow up in 2-3 weeks. Consider imaging if patient develops red flag signs or symptoms or if not improving at 6 weeks.   Precepted with Dr Erin Hearing

## 2014-10-04 ENCOUNTER — Telehealth: Payer: Self-pay | Admitting: Family Medicine

## 2014-10-04 NOTE — Telephone Encounter (Signed)
Insurance will not pay for Tramadol What other meds can be used?

## 2014-10-04 NOTE — Telephone Encounter (Signed)
Will forward to Dr. Parker.  Jazmin Hartsell,CMA  

## 2014-10-05 NOTE — Telephone Encounter (Signed)
Unfortunately tramadol is the best pain medication that we will be able to offer. She can take over the counter tylenol as needed if she is unable to obtain the tramadol. If patient is continuing to having significant pain, she should come in to the office for re-evaluation.  Algis Greenhouse. Jerline Pain, [place] Resident PGY-2 10/05/2014 1:43 PM

## 2014-10-05 NOTE — Telephone Encounter (Signed)
Spoke with patient and tylenol doesn't help her pain.  I offered her a sooner appt with another provider but she plans to wait until her appt on 10-17-14.  Jazmin Hartsell,CMA

## 2014-10-17 ENCOUNTER — Encounter: Payer: Self-pay | Admitting: Family Medicine

## 2014-10-17 ENCOUNTER — Ambulatory Visit (INDEPENDENT_AMBULATORY_CARE_PROVIDER_SITE_OTHER): Payer: Medicare Other | Admitting: Family Medicine

## 2014-10-17 VITALS — BP 138/90 | HR 83 | Temp 98.1°F | Ht 70.0 in | Wt 339.2 lb

## 2014-10-17 DIAGNOSIS — M545 Low back pain: Secondary | ICD-10-CM | POA: Diagnosis not present

## 2014-10-17 DIAGNOSIS — R109 Unspecified abdominal pain: Secondary | ICD-10-CM

## 2014-10-17 LAB — POCT URINALYSIS DIPSTICK
BILIRUBIN UA: NEGATIVE
GLUCOSE UA: NEGATIVE
Ketones, UA: NEGATIVE
LEUKOCYTES UA: NEGATIVE
NITRITE UA: NEGATIVE
Protein, UA: NEGATIVE
RBC UA: NEGATIVE
Spec Grav, UA: 1.015
Urobilinogen, UA: 0.2
pH, UA: 7

## 2014-10-17 MED ORDER — MORPHINE SULFATE 10 MG/ML IJ SOLN
2.0000 mg | Freq: Once | INTRAMUSCULAR | Status: AC
  Administered 2014-10-17: 2 mg via INTRAMUSCULAR

## 2014-10-17 MED ORDER — CYCLOBENZAPRINE HCL 5 MG PO TABS: 5.0000 mg | ORAL_TABLET | Freq: Three times a day (TID) | ORAL | Status: DC | PRN

## 2014-10-17 NOTE — Assessment & Plan Note (Addendum)
Patient unchanged since last visit, thought patient unable to pick up tramadol. States that she will be able to pick up within the next few days. No red flag signs or symptoms. Pain is most likely neuropathic in etiology. UA today without signs of UTI or nephrolithiasis. Will send in a prescription for flexeril for patient's back spasms. Will give a dose of morphine 2mg  today. Not a candidate for NSAIDs given CKD. Not a candidate for narcotics given substance abuse history. Continue gabapentin at current dose. Will follow up in 2-3 weeks. If not improving at that time or if develops red flag symptoms, will obtain imaging.

## 2014-10-17 NOTE — Addendum Note (Signed)
Addended by: Charlton Haws on: 10/17/2014 10:04 AM   Modules accepted: Orders

## 2014-10-17 NOTE — Patient Instructions (Signed)
Thank you for coming to the clinic today. It was nice seeing you.  I think most of your pain is coming from your back. Please continue taking the gabapentin as tolerated. Please try to pick up the tramadol when you are able. We will start a muscle relaxer today called flexeril to help with your back spasms. If you start to develop weakness, bowel or bladder incontinence, or numbness in your groin area, please let us know as soon as possible.   Please come back in 2-3 weeks.   Take care,  Dr Jerline Pain

## 2014-10-17 NOTE — Progress Notes (Signed)
    Subjective:  [name] is a 49 y.o. female who presents to the [facility] today with a chief complaint of left sided pain.   HPI:  Back / Left Flank Pain Patient seen in the clinic 18 days ago for that same complaint. Pain located on her left flank, radiating from her back to her umbilicus. At that time, pain was consistent with neuropathic etiology (described as burning sensation with pins and needles) and patient was started on tramadol and had her dose of gabapentin increased. Patient reports that her pain has not changed since the last visit. States that the gabapentin makes her sleepy and that she was unable to get tramadol due to her insurance. Received a dose of morphine during her last visit which helped with the pain.  No bowel or bladder incontinence. No saddle anesthesia. No weakness. No fevers or chills. No rash. No dysuria. No hematuria.    ROS: Per HPI   Objective:  Physical Exam: BP 138/90 mmHg  Pulse 83  Temp(Src) 98.1 F (36.7 C) (Oral)  Ht 5\' 10"  (1.778 m)  Wt 339 lb 3 oz (153.854 kg)  BMI 48.67 kg/m2  LMP 04/10/2006  Gen: NAD, resting comfortably CV: RRR with no murmurs appreciated Lungs: NWOB, CTAB with no crackles, wheezes, or rhonchi GI: Normal bowel sounds present. Soft, Nontender, Nondistended. MSK:  - Back: Surgical scars at C-spine and L-spine noted. No step-offs or deformities. Spinous processes nontender to palpation. No CVA tenderness. Tender along left paraspinal muscles. Tender to palpation along left flank into abdomen.  -Legs: 5/5 strength. Patellar reflexes 1+ and symmetric bilaterally.  Skin: warm, dry, no rashes Neuro: grossly normal, moves all extremities Psych: Normal affect and thought content  Results for orders placed or performed in visit on 10/17/14 (from the past 72 hour(s))  POCT urinalysis dipstick     Status: None   Collection Time: 10/17/14  9:01 AM  Result Value Ref Range   Color, UA YELLOW    Clarity, UA CLEAR    Glucose,  UA NEG    Bilirubin, UA NEG    Ketones, UA NEG    Spec Grav, UA 1.015    Blood, UA NEG    pH, UA 7.0    Protein, UA NEG    Urobilinogen, UA 0.2    Nitrite, UA NEG    Leukocytes, UA Negative Negative     Assessment/Plan:  Left low back pain Patient unchanged since last visit, thought patient unable to pick up tramadol. States that she will be able to pick up within the next few days. No red flag signs or symptoms. Pain is most likely neuropathic in etiology. UA today without signs of UTI or nephrolithiasis. Will send in a prescription for flexeril for patient's back spasms. Will give a dose of morphine 2mg  today. Not a candidate for NSAIDs given CKD. Not a candidate for narcotics given substance abuse history. Continue gabapentin at current dose. Will follow up in 2-3 weeks. If not improving at that time or if develops red flag symptoms, will obtain imaging.    Algis Greenhouse. Jerline Pain, [place] Resident PGY-2 10/17/2014 9:52 AM

## 2014-11-01 ENCOUNTER — Ambulatory Visit (INDEPENDENT_AMBULATORY_CARE_PROVIDER_SITE_OTHER): Payer: Medicare Other | Admitting: Family Medicine

## 2014-11-01 ENCOUNTER — Encounter: Payer: Self-pay | Admitting: Family Medicine

## 2014-11-01 VITALS — BP 127/87 | HR 92 | Temp 98.3°F | Ht 70.0 in | Wt 344.0 lb

## 2014-11-01 DIAGNOSIS — M545 Low back pain: Secondary | ICD-10-CM | POA: Diagnosis not present

## 2014-11-01 DIAGNOSIS — R109 Unspecified abdominal pain: Secondary | ICD-10-CM | POA: Diagnosis present

## 2014-11-01 MED ORDER — GABAPENTIN 300 MG PO CAPS: 300.0000 mg | ORAL_CAPSULE | Freq: Three times a day (TID) | ORAL | Status: DC

## 2014-11-01 MED ORDER — MORPHINE SULFATE 10 MG/ML IJ SOLN
2.0000 mg | Freq: Once | INTRAMUSCULAR | Status: AC
  Administered 2014-11-01: 2 mg via INTRAMUSCULAR

## 2014-11-01 NOTE — Assessment & Plan Note (Signed)
No red flag symptoms today. Given prolonged course (>6 weeks), will obtain imaging. Will obtain plain films of thoracic and lumbar spine in addition to left hip films. Patient likely has underlying hip OA contributing to her current pain. Will increase gabapentin to 600mg  tid. Will place referral to physical therapy. Will follow up in 2 weeks. If no improvement, will consider referral to sports medicine vs orthopedics.   Will give a dose of morphine 2mg  today. Not a candidate for NSAIDs given CKD. Not a candidate for narcotics given substance abuse history.

## 2014-11-01 NOTE — Progress Notes (Signed)
    Subjective:  [name] is a 49 y.o. female who presents to the [facility] today with a chief complaint of back / left flank Osborne   HPI:  Back Osborne Patient seen in the clinic 3 times over the past month for the same complaint. Initially presented with Osborne located on her left flank, radiating from her back to her umbilicus. At that time, Osborne was consistent with neuropathic etiology (described as burning sensation with pins and needles) and patient was started on tramadol and had her dose of gabapentin increased. At her last visit, flexeril was added to her regimen to treat her back spasms.   Currently, the patient reports that her symptoms are minimally better than her visit 2 weeks ago. She now mainly feels like her Osborne is in her left hip. Reports that the flexeril and tramadol helped some. Patient also received morphine during her last office visit which helped.   No bowel or bladder incontinence. No saddle anesthesia. No weakness. No fevers or chills. No rash. No dysuria. No hematuria.   ROS: Per HPI  Objective:  Physical Exam: BP 127/87 mmHg  Pulse 92  Temp(Src) 98.3 F (36.8 C) (Oral)  Ht 5\' 10"  (1.778 m)  Wt 344 lb (156.037 kg)  BMI 49.36 kg/m2  LMP 04/10/2006  Gen: NAD, resting comfortably CV: RRR with no murmurs appreciated Lungs: NWOB, CTAB with no crackles, wheezes, or rhonchi GI: Normal bowel sounds present. Soft, Nontender, Nondistended. MSK:  - Back: Surgical scars at C-spine and L-spine noted. No step-offs or deformities. Spinous processes nontender to palpation. No CVA tenderness. Tender along left paraspinal muscles. Tender to palpation along left flank into abdomen.  -Hip: Decreased internal and external rotation secondary to Osborne and body habitus -Legs: 5/5 strength. Patellar reflexes 1+ and symmetric bilaterally.  Skin: warm, dry, no rashes Neuro: grossly normal, moves all extremities Psych: Normal affect and thought content  Assessment/Plan:  Left low  back Osborne No red flag symptoms today. Given prolonged course (>6 weeks), will obtain imaging. Will obtain plain films of thoracic and lumbar spine in addition to left hip films. Patient likely has underlying hip OA contributing to her current Osborne. Will increase gabapentin to 600mg  tid. Will place referral to physical therapy. Will follow up in 2 weeks. If no improvement, will consider referral to sports medicine vs orthopedics.   Will give a dose of morphine 2mg  today. Not a candidate for NSAIDs given CKD. Not a candidate for narcotics given substance abuse history.    [name]. [name], [place] [facility] Resident PGY-2 11/01/2014 3:03 PM

## 2014-11-01 NOTE — Patient Instructions (Signed)
Thank you for coming to the clinic today. It was nice seeing you.  We will send in a referral to physical therapy. Please follow up with them. We will also increase your gabapentin dose. Please take 2 pills at night for the next three nights in addition to 1 pill in the morning and 1 pill in the afternoon. Then increase to 2 pills in the morning and at night 1 pill in the afternoon. Then increase to 2 pills three times per day.  We will get xrays of your back and your hip.   Please come back in 2-3 weeks for folllow up. If your symptoms are not improving, we may need to send you to an orthopedic office.  Take care,  Dr Jerline Pain

## 2014-11-16 ENCOUNTER — Ambulatory Visit: Payer: Medicare Other | Attending: Family Medicine

## 2014-11-18 ENCOUNTER — Ambulatory Visit: Payer: Medicare Other | Admitting: Family Medicine

## 2014-12-02 ENCOUNTER — Other Ambulatory Visit: Payer: Self-pay | Admitting: Family Medicine

## 2014-12-02 NOTE — Telephone Encounter (Signed)
Rx filled.  Algis Greenhouse. Jerline Pain, [place] Resident PGY-2 12/02/2014 4:51 PM

## 2014-12-05 ENCOUNTER — Ambulatory Visit: Payer: Medicare Other | Admitting: Family Medicine

## 2014-12-05 ENCOUNTER — Other Ambulatory Visit: Payer: Self-pay | Admitting: Family Medicine

## 2014-12-05 ENCOUNTER — Other Ambulatory Visit: Payer: Self-pay | Admitting: *Deleted

## 2014-12-05 MED ORDER — TRAMADOL HCL 50 MG PO TABS: 50.0000 mg | ORAL_TABLET | Freq: Three times a day (TID) | ORAL | Status: DC | PRN

## 2014-12-05 NOTE — Telephone Encounter (Signed)
Pt advised and verbalized understanding. Mairim Bade, CMA.

## 2014-12-05 NOTE — Telephone Encounter (Signed)
Rx filled and left at front of office.  Algis Greenhouse. Jerline Pain, [place] Resident PGY-2 12/05/2014 1:31 PM

## 2014-12-12 ENCOUNTER — Other Ambulatory Visit: Payer: Self-pay | Admitting: Family Medicine

## 2014-12-13 ENCOUNTER — Other Ambulatory Visit: Payer: Self-pay | Admitting: Family Medicine

## 2014-12-13 NOTE — Telephone Encounter (Signed)
Rx filled.  [name]. Jerline Pain, [place] Resident PGY-2 12/13/2014 8:44 AM

## 2015-01-02 ENCOUNTER — Ambulatory Visit: Payer: Medicare Other | Admitting: Family Medicine

## 2015-01-27 ENCOUNTER — Other Ambulatory Visit: Payer: Self-pay | Admitting: Family Medicine

## 2015-01-27 NOTE — Telephone Encounter (Signed)
Rx filled.  Algis Greenhouse. Jerline Pain, [place] Resident PGY-2 01/27/2015 10:48 AM

## 2015-01-31 ENCOUNTER — Other Ambulatory Visit: Payer: Self-pay | Admitting: Family Medicine

## 2015-02-03 NOTE — Telephone Encounter (Signed)
Pt informed. Fleeger, Jessica Dawn  

## 2015-02-03 NOTE — Telephone Encounter (Signed)
Rx filled and left at front of office.  Algis Greenhouse. Jerline Pain, [place] [facility] Resident PGY-2 02/03/2015 12:20 PM

## 2015-02-06 ENCOUNTER — Other Ambulatory Visit: Payer: Self-pay | Admitting: Family Medicine

## 2015-02-09 NOTE — Telephone Encounter (Signed)
One month supply signed and left at front of office. Patient will need office visit for future refills.  [name]. Jerline Pain, [place] Resident PGY-2 02/09/2015 5:05 PM

## 2015-02-10 ENCOUNTER — Other Ambulatory Visit: Payer: Self-pay | Admitting: Family Medicine

## 2015-02-10 NOTE — Telephone Encounter (Signed)
LM for patient that script is ready for pick up and that she needs to make an appt. Marielys Trinidad,CMA

## 2015-03-03 ENCOUNTER — Other Ambulatory Visit: Payer: Self-pay | Admitting: Family Medicine

## 2015-03-03 NOTE — Telephone Encounter (Signed)
Rx filled. Patient needs appointment for further refills.  Algis Greenhouse. Jerline Pain, [place] Resident PGY-2 03/03/2015 1:52 PM

## 2015-03-13 ENCOUNTER — Telehealth: Payer: Self-pay | Admitting: *Deleted

## 2015-03-13 NOTE — Telephone Encounter (Signed)
Received to letters from [employer] stating that ProAir and Esomeprazole will no longer be covered in patient's insurance.  Medications will require a prior authorizations to be considered for coverage.  PA forms placed in provider for review.  Derl Barrow, RN

## 2015-03-13 NOTE — Telephone Encounter (Signed)
Form filled out and returned to [place].   Algis Greenhouse. Jerline Pain, [place] [facility] Resident PGY-2 03/13/2015 4:51 PM

## 2015-03-14 MED ORDER — OMEPRAZOLE 20 MG PO CPDR: 20.0000 mg | DELAYED_RELEASE_CAPSULE | Freq: Every day | ORAL | Status: DC

## 2015-03-14 NOTE — Telephone Encounter (Signed)
PA for ProAir HFA was approved via [employer] until 01/28/16.  Reference number: [id number].  Derl Barrow, RN

## 2015-03-14 NOTE — Addendum Note (Signed)
Addended by: Vivi Barrack on: 03/14/2015 02:08 PM   Modules accepted: Orders, Medications

## 2015-03-14 NOTE — Telephone Encounter (Signed)
Rx for omeprazole sent in.  Algis Greenhouse. Jerline Pain, [place] [facility] Resident PGY-2 03/14/2015 2:08 PM

## 2015-03-14 NOTE — Telephone Encounter (Signed)
PA forms faxed to [employer] for review.  Derl Barrow, RN

## 2015-03-14 NOTE — Telephone Encounter (Signed)
PA for Esomeprazole was denied via [employer].  Patient must try and failed: Dexilant, Omeprazole or Pantoprazole.  Omeprazole and Pantoprazole are Tier 2.  Reference number: [id number].  Derl Barrow, RN

## 2015-03-30 ENCOUNTER — Other Ambulatory Visit: Payer: Self-pay | Admitting: *Deleted

## 2015-03-30 ENCOUNTER — Other Ambulatory Visit: Payer: Self-pay | Admitting: Family Medicine

## 2015-03-30 NOTE — Telephone Encounter (Signed)
Rx filled for 30 day supply. Patient has to have office appointment for further refills.  [name]. Jerline Pain, [place] [facility] Resident PGY-2 03/30/2015 2:50 PM

## 2015-03-31 ENCOUNTER — Other Ambulatory Visit: Payer: Self-pay | Admitting: *Deleted

## 2015-03-31 NOTE — Telephone Encounter (Signed)
Rx NOT filled. Patient needs to have follow up appointment for refills.  [name]. Jerline Pain, [place] Resident PGY-2 03/31/2015 5:34 PM

## 2015-04-03 NOTE — Telephone Encounter (Signed)
Patient is scheduled for 04-10-15 to see pcp and is aware that we will discuss this refill then. Jazmin Hartsell,CMA

## 2015-04-06 ENCOUNTER — Other Ambulatory Visit: Payer: Self-pay | Admitting: Family Medicine

## 2015-04-06 NOTE — Telephone Encounter (Signed)
Per Dr Marigene Ehlers last refill note in January, patient needs office visit for further refills.  Tramadol has been denied.  Please advise patient and have her follow up with Dr Jerline Pain for refills.

## 2015-04-10 ENCOUNTER — Ambulatory Visit (INDEPENDENT_AMBULATORY_CARE_PROVIDER_SITE_OTHER): Payer: Medicare Other | Admitting: Family Medicine

## 2015-04-10 ENCOUNTER — Encounter: Payer: Self-pay | Admitting: Family Medicine

## 2015-04-10 VITALS — BP 166/79 | HR 102 | Temp 97.9°F | Ht 70.0 in | Wt 347.0 lb

## 2015-04-10 DIAGNOSIS — M545 Low back pain, unspecified: Secondary | ICD-10-CM

## 2015-04-10 DIAGNOSIS — R05 Cough: Secondary | ICD-10-CM

## 2015-04-10 DIAGNOSIS — I10 Essential (primary) hypertension: Secondary | ICD-10-CM | POA: Diagnosis not present

## 2015-04-10 DIAGNOSIS — R059 Cough, unspecified: Secondary | ICD-10-CM

## 2015-04-10 DIAGNOSIS — F1910 Other psychoactive substance abuse, uncomplicated: Secondary | ICD-10-CM

## 2015-04-10 MED ORDER — LOSARTAN POTASSIUM 100 MG PO TABS: 100.0000 mg | ORAL_TABLET | Freq: Every day | ORAL | Status: DC

## 2015-04-10 MED ORDER — TRAMADOL HCL 50 MG PO TABS: ORAL_TABLET | ORAL | Status: DC

## 2015-04-10 NOTE — Assessment & Plan Note (Signed)
Elevated today, likely due to anxiety and recent crack cocaine use. Will switch lisinopril to losartan, otherwise continue amlodipine and HCTZ. Follow up in 1 week.

## 2015-04-10 NOTE — Assessment & Plan Note (Signed)
Dr. Tod Persia note:  Patient thinks a medicine helped her quit in 2004.  Reviewed records and could not find anything that stood out.  Suggested to her that we may have started her on a medicine when she quit in 2004 but that there is no specific medicine to treat crack-cocaine dependence.  Therefore, she was likely ready to quit and attributed part of her success to the medicine when it was more likely do to her own readiness and efforts.  She seemed to accept this.  She will start NA meetings.  Has a friend who has been clean over a year that is willing to take her.  She is planning to change who she hangs out with.  Has a cousin and a sister who are willing to "clean house" if she runs into trouble.  Feels supported by those closest to her.  She would like to meet with me.  Scheduled per patient instructions.

## 2015-04-10 NOTE — Patient Instructions (Addendum)
We will refill your tramadol today.  We will change your blood pressure medication today. Please take your medications as prescribed.  Please come back in 1 week to talk about your blood pressure, cough, and other issues.  Take care,  Dr Jerline Pain  ________________________________________________________________________  From Dr. Gwenlyn Saran: I plan to see you March 20th at 10:00.  Please call [phone] if you can not make this appointment. I think you have what you need to do this.  You are doing a lot of right things including starting meetings and changing people, places, and things.  I am happy to support you in your efforts. You can do this.

## 2015-04-10 NOTE — Progress Notes (Signed)
Dr. Jerline Pain requested a [place].   Presenting Issue:  Crack cocaine use.  She reports she relapsed shortly after her [place] appointments in 2012.  Wants to stop.  Thinks when she quit around 2004, she was started on a medicine that took the cravings away.  Psychiatric History - Diagnoses: Probable PTSD.  Mood disorder NOS.  Early childhood trauma, significant substance abuse history, other factors make it difficult to pinpoint a diagnosis.  There did seem to be evidence of hypomania as documented through [facility] but may not have met criteria.   - Hospitalizations: Two at [facility] - Pharmacotherapy: Paxil, Effexor, Abilify, Zoloft, Trazodone, Elavil, and likely others - Outpatient therapy: Has had.  Is interested.  Current and history of substance use:  Reports she stopped alcohol use.  Last use of crack was yesterday.  Does not plan on using again.  Assessment / Plan / Recommendations: See problem list for substance abuse.  She reports taking Abilify and Zoloft as prescribed.    Of note - drinking a 2 L of [employer]gate daily.  Agreed to address in the future.

## 2015-04-10 NOTE — Progress Notes (Signed)
Subjective:  [name] is a 50 y.o. female who presents to the [facility] today with a chief complaint of chronic pain follow up.   HPI:  Chronic Back Pain Stable since last visit. Pain primarily located in the central part of her lower back. Has previously seen the pain clinic, but does not want to go back. Is currently using tramadol as needed which significantly relieves the pain. No bowel or bladder incontinence. No weakness. No saddle anesthesia.   Drug Abuse Patient currently uses crack cocaine most days of the week. She wants to stop. She has a long history of substance abuse, but most recently relapsed 5 years ago and has been using since. States that she would use everyday if she could. Reports that she was on a medication in the past that reduced the cravings, but is not sure what the name of it is.   Hypertension BP Readings from Last 3 Encounters:  04/10/15 166/79  11/01/14 127/87  10/17/14 138/90   Home BP monitoring-No Compliant with medications-yes, without side effects ROS-Denies any CP, HA, SOB, blurry vision, LE edema, transient weakness, orthopnea, PND.   Cough Present for several months. Has been using albuterol which does not help. Also notes a small increase in shortness of breath and wheezing. No fevers. No sick contacts.   ROS: Per HPI  PMH: Smoking history reviewed.    Objective:  Physical Exam: BP 166/79 mmHg  Pulse 102  Temp(Src) 97.9 F (36.6 C) (Oral)  Ht 5\' 10"  (1.778 m)  Wt 347 lb (157.398 kg)  BMI 49.79 kg/m2  LMP 04/10/2006  Gen: NAD, anxious appearing, rocking back and forth CV: RRR with no murmurs appreciated Pulm: NWOB, Frequent coughing noted. CTAB without significant wheeze. Good air movement. GI: Normal bowel sounds present. Soft, Nontender, Nondistended. MSK: no edema, cyanosis, or clubbing noted Skin: warm, dry Neuro: grossly normal, moves all extremities Psych: Normal affect and thought content  Assessment/Plan:    Substance abuse Dr. Tod Persia note:  Patient thinks a medicine helped her quit in 2004.  Reviewed records and could not find anything that stood out.  Suggested to her that we may have started her on a medicine when she quit in 2004 but that there is no specific medicine to treat crack-cocaine dependence.  Therefore, she was likely ready to quit and attributed part of her success to the medicine when it was more likely do to her own readiness and efforts.  She seemed to accept this.  She will start NA meetings.  Has a friend who has been clean over a year that is willing to take her.  She is planning to change who she hangs out with.  Has a cousin and a sister who are willing to "clean house" if she runs into trouble.  Feels supported by those closest to her.  She would like to meet with me.  Scheduled per patient instructions.    Back pain No red flag signs or symptoms. Will refill tramadol.   Hypertension Elevated today, likely due to anxiety and recent crack cocaine use. Will switch lisinopril to losartan, otherwise continue amlodipine and HCTZ. Follow up in 1 week.   Cough Unclear etiology. May be related to patient's lisinopril - will switch to losartan today. Possibly has undertreated COPD as well. Follow up in 1 week. If not improving after switching BP meds, will increase COPD management. Can also consider starting intranasal steroids to treat any potential post-nasal drip.  Algis Greenhouse. Jerline Pain, MD  [place] Resident PGY-2 04/10/2015 12:18 PM

## 2015-04-10 NOTE — Assessment & Plan Note (Signed)
No red flag signs or symptoms. Will refill tramadol.

## 2015-04-17 ENCOUNTER — Ambulatory Visit (INDEPENDENT_AMBULATORY_CARE_PROVIDER_SITE_OTHER): Payer: Medicare Other | Admitting: Psychology

## 2015-04-17 ENCOUNTER — Ambulatory Visit (INDEPENDENT_AMBULATORY_CARE_PROVIDER_SITE_OTHER): Payer: Medicare Other | Admitting: Family Medicine

## 2015-04-17 ENCOUNTER — Encounter: Payer: Self-pay | Admitting: Family Medicine

## 2015-04-17 VITALS — BP 142/74 | HR 108 | Temp 98.1°F | Wt 353.2 lb

## 2015-04-17 DIAGNOSIS — R05 Cough: Secondary | ICD-10-CM | POA: Diagnosis not present

## 2015-04-17 DIAGNOSIS — J309 Allergic rhinitis, unspecified: Secondary | ICD-10-CM

## 2015-04-17 DIAGNOSIS — F32A Depression, unspecified: Secondary | ICD-10-CM

## 2015-04-17 DIAGNOSIS — Z1239 Encounter for other screening for malignant neoplasm of breast: Secondary | ICD-10-CM

## 2015-04-17 DIAGNOSIS — Z79899 Other long term (current) drug therapy: Secondary | ICD-10-CM | POA: Diagnosis not present

## 2015-04-17 DIAGNOSIS — F1910 Other psychoactive substance abuse, uncomplicated: Secondary | ICD-10-CM | POA: Diagnosis not present

## 2015-04-17 DIAGNOSIS — F329 Major depressive disorder, single episode, unspecified: Secondary | ICD-10-CM

## 2015-04-17 DIAGNOSIS — J449 Chronic obstructive pulmonary disease, unspecified: Secondary | ICD-10-CM | POA: Diagnosis not present

## 2015-04-17 DIAGNOSIS — R059 Cough, unspecified: Secondary | ICD-10-CM | POA: Insufficient documentation

## 2015-04-17 DIAGNOSIS — Z0000 Encounter for general adult medical examination without abnormal findings: Secondary | ICD-10-CM

## 2015-04-17 DIAGNOSIS — Z1211 Encounter for screening for malignant neoplasm of colon: Secondary | ICD-10-CM

## 2015-04-17 DIAGNOSIS — E785 Hyperlipidemia, unspecified: Secondary | ICD-10-CM | POA: Diagnosis not present

## 2015-04-17 DIAGNOSIS — I10 Essential (primary) hypertension: Secondary | ICD-10-CM | POA: Diagnosis not present

## 2015-04-17 LAB — COMPLETE METABOLIC PANEL WITH GFR
ALBUMIN: 3.7 g/dL (ref 3.6–5.1)
ALK PHOS: 74 U/L (ref 33–130)
ALT: 13 U/L (ref 6–29)
AST: 14 U/L (ref 10–35)
BILIRUBIN TOTAL: 0.5 mg/dL (ref 0.2–1.2)
BUN: 15 mg/dL (ref 7–25)
CALCIUM: 9.4 mg/dL (ref 8.6–10.4)
CO2: 27 mmol/L (ref 20–31)
CREATININE: 1.6 mg/dL — AB (ref 0.50–1.05)
Chloride: 100 mmol/L (ref 98–110)
GFR, Est African American: 43 mL/min — ABNORMAL LOW (ref >=60)
GFR, Est Non African American: 37 mL/min — ABNORMAL LOW (ref >=60)
Glucose, Bld: 140 mg/dL — ABNORMAL HIGH (ref 65–99)
POTASSIUM: 4 mmol/L (ref 3.5–5.3)
Sodium: 138 mmol/L (ref 135–146)
TOTAL PROTEIN: 6.8 g/dL (ref 6.1–8.1)

## 2015-04-17 LAB — LIPID PANEL
CHOL/HDL RATIO: 4 ratio (ref <=5.0)
CHOLESTEROL: 238 mg/dL — AB (ref 125–200)
HDL: 59 mg/dL (ref >=46)
LDL Cholesterol: 145 mg/dL — ABNORMAL HIGH (ref <130)
Triglycerides: 171 mg/dL — ABNORMAL HIGH (ref <150)
VLDL: 34 mg/dL — ABNORMAL HIGH (ref <30)

## 2015-04-17 LAB — CBC
HEMATOCRIT: 49.3 % — AB (ref 36.0–46.0)
HEMOGLOBIN: 17.4 g/dL — AB (ref 12.0–15.0)
MCH: 28.9 pg (ref 26.0–34.0)
MCHC: 35.3 g/dL (ref 30.0–36.0)
MCV: 81.8 fL (ref 78.0–100.0)
MPV: 10.4 fL (ref 8.6–12.4)
Platelets: 306 10*3/uL (ref 150–400)
RBC: 6.03 MIL/uL — AB (ref 3.87–5.11)
RDW: 14.6 % (ref 11.5–15.5)
WBC: 9.5 10*3/uL (ref 4.0–10.5)

## 2015-04-17 LAB — POCT GLYCOSYLATED HEMOGLOBIN (HGB A1C): Hemoglobin A1C: 6.4

## 2015-04-17 MED ORDER — TIOTROPIUM BROMIDE MONOHYDRATE 18 MCG IN CAPS: 18.0000 ug | ORAL_CAPSULE | Freq: Every day | RESPIRATORY_TRACT | Status: DC

## 2015-04-17 MED ORDER — SERTRALINE HCL 50 MG PO TABS: 100.0000 mg | ORAL_TABLET | Freq: Every day | ORAL | Status: DC

## 2015-04-17 MED ORDER — ALBUTEROL SULFATE HFA 108 (90 BASE) MCG/ACT IN AERS
1.0000 | INHALATION_SPRAY | RESPIRATORY_TRACT | Status: DC | PRN
Start: 2015-04-17 — End: 2015-11-23

## 2015-04-17 MED ORDER — PRAVASTATIN SODIUM 40 MG PO TABS: 40.0000 mg | ORAL_TABLET | Freq: Every day | ORAL | Status: DC

## 2015-04-17 MED ORDER — CETIRIZINE HCL 10 MG PO CAPS
1.0000 | ORAL_CAPSULE | ORAL | Status: DC
Start: 2015-04-17 — End: 2015-06-20

## 2015-04-17 NOTE — Assessment & Plan Note (Signed)
Will check lipid panel today. Restart pravastatin.

## 2015-04-17 NOTE — Assessment & Plan Note (Signed)
Has used crack 3 times within the past week, though less than her normal amount. Has an appointment with Dr Gwenlyn Saran later this morning.

## 2015-04-17 NOTE — Progress Notes (Signed)
    Subjective:  [name] is a 50 y.o. female who presents to the [facility] today with a chief complaint of depression.   HPI:  Depression Patient has been off of her zoloft for several months. Patient is not sure exactly how long she has been off, but has noticed that she has had worsened depressive symptoms over that time. No current active SI or HI. Patient reports that she did well with zoloft in the past and did not have any significant side effects.   HLD Was previously on pravastatin, though has been off for several months. Previously tolerated without side effects.   Hypertension Switched lisinipril to losartan during visit  1 week ago due to cough. Has done well without side effects.  BP Readings from Last 3 Encounters:  04/17/15 142/74  04/10/15 166/79  11/01/14 127/87   Home BP monitoring-No Compliant with medications-yes, without side effects ROS-Denies any CP, HA, blurry vision, LE edema, transient weakness, orthopnea, PND.   Cough Changed lisinopril to losartan last visit. Has improved, though still has occasional productive cough. Patient has not been using cetirizine. She has been using prilosec everyday.   COPD Overall stable per patient. Needs a refill of spiriva and albuterol.   HCM Due for colonoscopy, mammogram, and pap test.  ROS: Per HPI  PMH: Smoking history reviewed.   Objective:  Physical Exam: BP 142/74 mmHg  Pulse 108  Temp(Src) 98.1 F (36.7 C) (Oral)  Wt 353 lb 3.2 oz (160.21 kg)  LMP 04/10/2006  Gen: NAD, resting comfortably CV: RRR with no murmurs appreciated Pulm: NWOB, Frequent coughing noted. CTAB without significant wheeze. Good air movement. GI: Normal bowel sounds present. Soft, Nontender, Nondistended. MSK: no edema, cyanosis, or clubbing noted Skin: warm, dry Neuro: grossly normal, moves all extremities  Assessment/Plan:  Depression Will restart zoloft 50mg  daily today. Increase to 100mg  daily in 1 week. Follow up in 3-4  weeks.  Hyperlipidemia Will check lipid panel today. Restart pravastatin.   Hypertension Improved today from last visit. Will continue amlodipine, HCTZ, and losartan.   COPD (chronic obstructive pulmonary disease) Stable. Will refill spiriva and albuterol today. Will need repeat PFTs soon.   Healthcare maintenance Will check A1c today. Will refer for mammogram and colonoscopy. Due for Pap test. Patient will return within the next few months for this.   Cough Improved today after switching from lisinopril to losartan. May have a component of under-treated COPD given that patient has been off spiriva. Patient already on GERD therapy. Will restart cetirizine. Follow up in 1 month. If not improving, can consider adding flonase and/or checking CXR.   Substance abuse Has used crack 3 times within the past week, though less than her normal amount. Has an appointment with Dr Gwenlyn Saran later this morning.    Algis Greenhouse. Jerline Pain, [place] [facility] Resident PGY-2 04/17/2015 10:21 AM

## 2015-04-17 NOTE — Assessment & Plan Note (Signed)
Improved today after switching from lisinopril to losartan. May have a component of under-treated COPD given that patient has been off spiriva. Patient already on GERD therapy. Will restart cetirizine. Follow up in 1 month. If not improving, can consider adding flonase and/or checking CXR.

## 2015-04-17 NOTE — Patient Instructions (Signed)
Please follow-up:  March 27th at 10:00.  Call me [phone] if you need to.   Here is the plan we crafted today: 1. Breathing:  Breathe in the flowers; blow out the birthday candles.  Try a count of 3 seconds of breath in and 5 seconds of breath out.  If you get really good, you can increase to 5 seconds in and 7 seconds out.   2. Thoughts:  When you have your unhealthy thoughts of "Why didn't she love me?" and "What did I do wrong?" your job is to Nowthen these and MANAGE them so that they don't manage you.  Your answers these are:  "That's her problem."  "Nothing.  I am enough.  I deserved to be loved."  This is WORK.  I takes time.  Every time you let these thoughts go unchecked, you are hurting yourself.  And you don't deserve that. 3. Find the book you need to help your recovery. 4. Get to some meetings. 5. Plan your evenings in advance.

## 2015-04-17 NOTE — Assessment & Plan Note (Signed)
Dr. Jerline Pain restarted Zoloft.  I was not aware that she was off of it.  Getting back on will hopefully be helpful for mood issues and therefore, might help her with substance abuse recovery as well.

## 2015-04-17 NOTE — Assessment & Plan Note (Addendum)
Three lapses in the past week.  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  She reports that are very respectful and would be less likely to sell to her if they knew she was quitting.  Cognitive arm of CBT is a little tricky for [place].  Tried to keep it really simple by identifying the two hottest thoughts for her and developing a counter thought.  NA meetings across [place] were provided as well as the meeting that is closest to her.  She thinks she might be able to borrow her neighbor's car to attend a meeting.  See patient instructions for further plan.   Close follow-up for now to hopefully lend some accountability.  Also benefits from the support.

## 2015-04-17 NOTE — Assessment & Plan Note (Signed)
Will restart zoloft 50mg  daily today. Increase to 100mg  daily in 1 week. Follow up in 3-4 weeks.

## 2015-04-17 NOTE — Assessment & Plan Note (Signed)
Improved today from last visit. Will continue amlodipine, HCTZ, and losartan.

## 2015-04-17 NOTE — Assessment & Plan Note (Signed)
Stable. Will refill spiriva and albuterol today. Will need repeat PFTs soon.

## 2015-04-17 NOTE — Progress Notes (Signed)
Reason for follow-up:  Crack cocaine abuse.  Issues discussed:  Relapses.  She reports three in the past week.  Discussed triggers.  She thinks forgiveness is a barrier.  She thinks she hasn't been able to forgive herself for not reconciling with her mother before her mother's death six years ago.  Evening time is hard.  Time with her cousin is good.  Hasn't been able to get to a meeting yet.    Identified goals:  Diaphragmatic breathing as a way to manage belly pain (if it is partially stress mediated).  Getting to meetings.  Checking thoughts that are unhealthy.

## 2015-04-17 NOTE — Patient Instructions (Signed)
We will restart your zoloft today. Please take 1 pill for 1 week, then increase to 2 pills daily.  We will refill your pravastatin, spiriva, albuterol, and cetirizine today.  We will refer you to have a colonoscopy and mammogram.  We will check blood work today.  Please come back in 3-4 weeks to follow up on your depression and cough.  Take care,  Dr Jerline Pain

## 2015-04-17 NOTE — Assessment & Plan Note (Signed)
Will check A1c today. Will refer for mammogram and colonoscopy. Due for Pap test. Patient will return within the next few months for this.

## 2015-04-19 ENCOUNTER — Telehealth: Payer: Self-pay | Admitting: Family Medicine

## 2015-04-19 ENCOUNTER — Other Ambulatory Visit: Payer: Self-pay

## 2015-04-19 DIAGNOSIS — Z1211 Encounter for screening for malignant neoplasm of colon: Secondary | ICD-10-CM

## 2015-04-19 DIAGNOSIS — Z1231 Encounter for screening mammogram for malignant neoplasm of breast: Secondary | ICD-10-CM

## 2015-04-19 NOTE — Telephone Encounter (Signed)
Pt called [facility] and was told she needs a referral so she can schedule an appt for her colonoscopy

## 2015-04-19 NOTE — Telephone Encounter (Signed)
Referral placed.  Algis Greenhouse. Jerline Pain, [place] Resident PGY-2 04/19/2015 5:59 PM

## 2015-04-19 NOTE — Telephone Encounter (Signed)
Will forward to MD. Taunia Frasco,CMA  

## 2015-04-28 ENCOUNTER — Ambulatory Visit
Admission: RE | Admit: 2015-04-28 | Discharge: 2015-04-28 | Disposition: A | Discharge status: Home / Self Care | Payer: Medicare Other | Source: Ambulatory Visit

## 2015-04-28 DIAGNOSIS — Z1231 Encounter for screening mammogram for malignant neoplasm of breast: Secondary | ICD-10-CM | POA: Diagnosis not present

## 2015-05-01 ENCOUNTER — Ambulatory Visit: Payer: Medicare Other | Admitting: Psychology

## 2015-05-09 ENCOUNTER — Encounter: Payer: Self-pay | Admitting: Family Medicine

## 2015-05-09 ENCOUNTER — Ambulatory Visit (INDEPENDENT_AMBULATORY_CARE_PROVIDER_SITE_OTHER): Payer: Medicare Other | Admitting: Family Medicine

## 2015-05-09 VITALS — BP 155/86 | HR 118 | Temp 97.7°F | Wt 354.0 lb

## 2015-05-09 DIAGNOSIS — F329 Major depressive disorder, single episode, unspecified: Secondary | ICD-10-CM | POA: Diagnosis not present

## 2015-05-09 DIAGNOSIS — I10 Essential (primary) hypertension: Secondary | ICD-10-CM

## 2015-05-09 DIAGNOSIS — F32A Depression, unspecified: Secondary | ICD-10-CM

## 2015-05-09 DIAGNOSIS — J449 Chronic obstructive pulmonary disease, unspecified: Secondary | ICD-10-CM

## 2015-05-09 DIAGNOSIS — R7303 Prediabetes: Secondary | ICD-10-CM | POA: Diagnosis not present

## 2015-05-09 MED ORDER — MOMETASONE FURO-FORMOTEROL FUM 200-5 MCG/ACT IN AERO: 2.0000 | INHALATION_SPRAY | Freq: Two times a day (BID) | RESPIRATORY_TRACT | Status: DC

## 2015-05-09 MED ORDER — TRAMADOL HCL 50 MG PO TABS: ORAL_TABLET | ORAL | Status: DC

## 2015-05-09 NOTE — Progress Notes (Signed)
    Subjective:  [name] is a 50 y.o. female who presents to the [facility] today with a chief complaint of depression follow up.   HPI:  Depression Started back on zoloft 50mg  2-3 weeks ago. Has been doing well without significant side effects. Overall feels like mood has been better. No SI or HI.   Hypertension BP Readings from Last 3 Encounters:  05/09/15 155/86  04/17/15 142/74  04/10/15 166/79   Home BP monitoring-No Compliant with medications-yes, without side effects ROS-Denies any CP, HA, SOB, blurry vision,  transient weakness, orthopnea, PND.   Prediabetes A1c of 6.4 last month. Has recently starting cutting out soda. Was previously drinking two 2L of [employer] daily. Has also been trying to walk more. No polyuria or polydipsia.   COPD Overall stable, though reports getting short of breath while walking around the house. Has been using albuterol about 3 times per week. Is compliant with spiriva with no missed doses.  ROS: Per HPI  PMH: Smoking history reviewed.    Objective:  Physical Exam: BP 155/86 mmHg  Pulse 118  Temp(Src) 97.7 F (36.5 C) (Oral)  Wt 354 lb (160.573 kg)  LMP 04/10/2006  Gen: NAD, resting comfortably CV: RRR with no murmurs appreciated Pulm: NWOB, CTAB with no crackles, wheezes, or rhonchi MSK: no edema, cyanosis, or clubbing noted Skin: warm, dry Neuro: grossly normal, moves all extremities Psych: Normal affect and thought content  Assessment/Plan:  Depression Increase zoloft to 100mg  daily. Follow up in 2-3 weeks. Instructed patient to be aware of potential symptoms of serotonin syndrome including palpitations, flushing, anxiety, etc given that she is also taking tramadol for Osborne.   Hypertension Mildly elevated today, though patient had not taken BP meds. Will recheck BMP given recent increase in creatinine. Follow up BP in 2-3 weeks. If still elevated, will increase therapy.  Prediabetes A1c elevated last month. Patient  deferred starting metformin today. Will check BMP today. Recheck A1c in 3 months.   COPD (chronic obstructive pulmonary disease) Given shortness of breath with minimal exertion, will increase therapy today. Will give sample of dulera. Continue spiriva and albuterol as needed. Will need PFTs soon.   [name]. [name], [place] Resident PGY-2 05/09/2015 5:12 PM

## 2015-05-09 NOTE — Patient Instructions (Addendum)
Please take 2 pills of the zoloft every day.  We will recheck your kidney today. If it looks ok, we can think about starting a fluid pill.  We will recheck your diabetes level in 1-2 months.   We will start a new inhaler today called dulera. Please take 2 puffs twice daily.  Please come back in 2-3 weeks for follow up.  Take care,  Dr Jerline Pain

## 2015-05-09 NOTE — Assessment & Plan Note (Signed)
Mildly elevated today, though patient had not taken BP meds. Will recheck BMP given recent increase in creatinine. Follow up BP in 2-3 weeks. If still elevated, will increase therapy.

## 2015-05-09 NOTE — Assessment & Plan Note (Signed)
A1c elevated last month. Patient deferred starting metformin today. Will check BMP today. Recheck A1c in 3 months.

## 2015-05-09 NOTE — Assessment & Plan Note (Signed)
Given shortness of breath with minimal exertion, will increase therapy today. Will give sample of dulera. Continue spiriva and albuterol as needed. Will need PFTs soon.

## 2015-05-09 NOTE — Assessment & Plan Note (Signed)
Increase zoloft to 100mg  daily. Follow up in 2-3 weeks. Instructed patient to be aware of potential symptoms of serotonin syndrome including palpitations, flushing, anxiety, etc given that she is also taking tramadol for pain.

## 2015-05-10 ENCOUNTER — Telehealth: Payer: Self-pay | Admitting: Family Medicine

## 2015-05-10 LAB — BASIC METABOLIC PANEL WITH GFR
BUN: 18 mg/dL (ref 7–25)
CALCIUM: 9.3 mg/dL (ref 8.6–10.4)
CO2: 22 mmol/L (ref 20–31)
Chloride: 104 mmol/L (ref 98–110)
Creat: 1.78 mg/dL — ABNORMAL HIGH (ref 0.50–1.05)
GFR, EST AFRICAN AMERICAN: 38 mL/min — AB (ref >=60)
GFR, Est Non African American: 33 mL/min — ABNORMAL LOW (ref >=60)
GLUCOSE: 112 mg/dL — AB (ref 65–99)
Potassium: 3.9 mmol/L (ref 3.5–5.3)
Sodium: 139 mmol/L (ref 135–146)

## 2015-05-10 NOTE — Telephone Encounter (Signed)
Called patient to inform her of elevated creatinine. Instructed her to stay well hydrated and stop her losartan. Instructed patient to follow up in 1-2 weeks for BP check and recheck BMP.  [name]. Jerline Pain, [place] Resident PGY-2 05/10/2015 9:46 AM

## 2015-05-26 DIAGNOSIS — Z1211 Encounter for screening for malignant neoplasm of colon: Secondary | ICD-10-CM | POA: Diagnosis not present

## 2015-05-29 ENCOUNTER — Ambulatory Visit: Payer: Medicare Other | Admitting: Family Medicine

## 2015-05-31 ENCOUNTER — Other Ambulatory Visit: Payer: Self-pay | Admitting: Family Medicine

## 2015-05-31 NOTE — Telephone Encounter (Signed)
Rx filled. Rx for tramadol left at front of office.   Algis Greenhouse. Jerline Pain, [place] Resident PGY-2 05/31/2015 1:44 PM

## 2015-06-01 NOTE — Telephone Encounter (Signed)
Spoke with patient and she is aware that scripts have been sent to the pharmacy and that she will need to pick up the script for her tramadol. Ken Bonn,CMA

## 2015-06-02 ENCOUNTER — Other Ambulatory Visit: Payer: Self-pay | Admitting: Family Medicine

## 2015-06-05 ENCOUNTER — Other Ambulatory Visit: Payer: Self-pay | Admitting: Family Medicine

## 2015-06-05 NOTE — Telephone Encounter (Signed)
Rx was filled 5 days ago and left at front of office. Will not refill.  Algis Greenhouse. Jerline Pain, [place] Resident PGY-2 06/05/2015 1:45 PM

## 2015-06-06 NOTE — Telephone Encounter (Signed)
Pt aware of rx at front, on her way to pick it up.

## 2015-06-23 ENCOUNTER — Encounter (HOSPITAL_COMMUNITY): Payer: Self-pay | Admitting: *Deleted

## 2015-07-04 ENCOUNTER — Other Ambulatory Visit: Payer: Self-pay | Admitting: Gastroenterology

## 2015-07-06 ENCOUNTER — Encounter (HOSPITAL_COMMUNITY)
Admission: RE | Disposition: A | Discharge status: Home / Self Care | Payer: Self-pay | Source: Ambulatory Visit | Attending: Gastroenterology

## 2015-07-06 ENCOUNTER — Ambulatory Visit (HOSPITAL_COMMUNITY): Payer: Medicare Other | Admitting: Certified Registered Nurse Anesthetist

## 2015-07-06 ENCOUNTER — Encounter (HOSPITAL_COMMUNITY): Payer: Self-pay | Admitting: *Deleted

## 2015-07-06 ENCOUNTER — Ambulatory Visit (HOSPITAL_COMMUNITY)
Admission: RE | Admit: 2015-07-06 | Discharge: 2015-07-06 | Disposition: A | Discharge status: Home / Self Care | Payer: Medicare Other | Source: Ambulatory Visit | Attending: Gastroenterology | Admitting: Gastroenterology

## 2015-07-06 DIAGNOSIS — I10 Essential (primary) hypertension: Secondary | ICD-10-CM | POA: Diagnosis not present

## 2015-07-06 DIAGNOSIS — I509 Heart failure, unspecified: Secondary | ICD-10-CM | POA: Diagnosis not present

## 2015-07-06 DIAGNOSIS — Z1211 Encounter for screening for malignant neoplasm of colon: Secondary | ICD-10-CM | POA: Diagnosis not present

## 2015-07-06 DIAGNOSIS — F329 Major depressive disorder, single episode, unspecified: Secondary | ICD-10-CM | POA: Diagnosis not present

## 2015-07-06 DIAGNOSIS — M19019 Primary osteoarthritis, unspecified shoulder: Secondary | ICD-10-CM | POA: Insufficient documentation

## 2015-07-06 DIAGNOSIS — F17210 Nicotine dependence, cigarettes, uncomplicated: Secondary | ICD-10-CM | POA: Diagnosis not present

## 2015-07-06 DIAGNOSIS — G4730 Sleep apnea, unspecified: Secondary | ICD-10-CM | POA: Diagnosis not present

## 2015-07-06 DIAGNOSIS — J449 Chronic obstructive pulmonary disease, unspecified: Secondary | ICD-10-CM | POA: Insufficient documentation

## 2015-07-06 DIAGNOSIS — I110 Hypertensive heart disease with heart failure: Secondary | ICD-10-CM | POA: Diagnosis not present

## 2015-07-06 DIAGNOSIS — K219 Gastro-esophageal reflux disease without esophagitis: Secondary | ICD-10-CM | POA: Diagnosis not present

## 2015-07-06 DIAGNOSIS — E785 Hyperlipidemia, unspecified: Secondary | ICD-10-CM | POA: Diagnosis not present

## 2015-07-06 DIAGNOSIS — M1610 Unilateral primary osteoarthritis, unspecified hip: Secondary | ICD-10-CM | POA: Insufficient documentation

## 2015-07-06 HISTORY — DX: Heart failure, unspecified: I50.9

## 2015-07-06 HISTORY — PX: COLONOSCOPY WITH PROPOFOL: SHX5780

## 2015-07-06 HISTORY — DX: Malignant (primary) neoplasm, unspecified: C80.1

## 2015-07-06 SURGERY — COLONOSCOPY WITH PROPOFOL
Anesthesia: General

## 2015-07-06 MED ORDER — PROPOFOL 10 MG/ML IV BOLUS
INTRAVENOUS | Status: AC
  Filled 2015-07-06: qty 20

## 2015-07-06 MED ORDER — LACTATED RINGERS IV SOLN
INTRAVENOUS | Status: DC
  Administered 2015-07-06: 1000 mL via INTRAVENOUS

## 2015-07-06 MED ORDER — PROPOFOL 10 MG/ML IV BOLUS
INTRAVENOUS | Status: DC | PRN
  Administered 2015-07-06: 100 mg via INTRAVENOUS
  Administered 2015-07-06 (×2): 50 mg via INTRAVENOUS

## 2015-07-06 MED ORDER — SODIUM CHLORIDE 0.9 % IV SOLN: INTRAVENOUS | Status: DC

## 2015-07-06 SURGICAL SUPPLY — 22 items

## 2015-07-06 NOTE — Op Note (Signed)
[facility] Patient Name: [name] Procedure Date: 07/06/2015 MRN: [id number] Attending MD: Missy Sabins , MD Date of Birth: January 06, 1966 CSN: [id number] Age: 50 Admit Type: Outpatient Procedure:                Colonoscopy Indications:              Screening for colorectal malignant neoplasm Providers:                Elyse Jarvis. Amedeo Plenty, MD, Carmie End, RN, Corliss Parish, Technician, Lupita Raider, CRNA Referring MD:              Medicines:                Propofol per Anesthesia Complications:            No immediate complications. Estimated Blood Loss:     Estimated blood loss: none. Procedure:                Pre-Anesthesia Assessment:                           - Prior to the procedure, a History and Physical                            was performed, and patient medications and                            allergies were reviewed. The patient's tolerance of                            previous anesthesia was also reviewed. The risks                            and benefits of the procedure and the sedation                            options and risks were discussed with the patient.                            All questions were answered, and informed consent                            was obtained. Prior Anticoagulants: The patient has                            taken no previous anticoagulant or antiplatelet                            agents. ASA Grade Assessment: III - A patient with                            severe systemic disease. After reviewing the risks  and benefits, the patient was deemed in                            satisfactory condition to undergo the procedure.                           After obtaining informed consent, the colonoscope                            was passed under direct vision. Throughout the                            procedure, the patient's blood pressure, pulse, and           oxygen saturations were monitored continuously. The                            [id number] scope was introduced through                            the anus and advanced to the the cecum, identified                            by appendiceal orifice and ileocecal valve. The                            colonoscopy was performed without difficulty. The                            patient tolerated the procedure fairly well. The                            quality of the bowel preparation was adequate to                            identify polyps 6 mm and larger in size. The                            ileocecal valve, appendiceal orifice, and rectum                            were photographed. Findings:      The entire examined colon appeared normal on direct and retroflexion       views. Impression:               - The entire examined colon is normal on direct and                            retroflexion views.                           - No specimens collected. Moderate Sedation:      no moderate sedation Recommendation:           - Patient has a contact number available for  emergencies. The signs and symptoms of potential                            delayed complications were discussed with the                            patient. Return to normal activities tomorrow.                            Written discharge instructions were provided to the                            patient.                           - Resume previous diet.                           - Continue present medications.                           - Repeat colonoscopy in 10 years for screening                            purposes. Procedure Code(s):        --- Professional ---                           [id number], Colorectal cancer screening; colonoscopy on                            individual not meeting criteria for high risk Diagnosis Code(s):        --- Professional ---                            Z12.11, Encounter for screening for malignant                            neoplasm of colon CPT copyright 2016 [employer]. All rights reserved. The codes documented in this report are preliminary and upon coder review may  be revised to meet current compliance requirements. Missy Sabins, MD 07/06/2015 12:03:16 PM This report has been signed electronically. Number of Addenda: 0

## 2015-07-06 NOTE — Discharge Instructions (Signed)
Moderate Conscious Sedation, Adult, Care After °Refer to this sheet in the next few weeks. These instructions provide you with information on caring for yourself after your procedure. Your health care provider may also give you more specific instructions. Your treatment has been planned according to current medical practices, but problems sometimes occur. Call your health care provider if you have any problems or questions after your procedure. °WHAT TO EXPECT AFTER THE PROCEDURE  °After your procedure: °· You may feel sleepy, clumsy, and have poor balance for several hours. °· Vomiting may occur if you eat too soon after the procedure. °HOME CARE INSTRUCTIONS °· Do not participate in any activities where you could become injured for at least 24 hours. Do not: °¨ Drive. °¨ Swim. °¨ Ride a bicycle. °¨ Operate heavy machinery. °¨ Cook. °¨ Use power tools. °¨ Climb ladders. °¨ Work from a high place. °· Do not make important decisions or sign legal documents until you are improved. °· If you vomit, drink water, juice, or soup when you can drink without vomiting. Make sure you have little or no nausea before eating solid foods. °· Only take over-the-counter or prescription medicines for pain, discomfort, or fever as directed by your health care provider. °· Make sure you and your family fully understand everything about the medicines given to you, including what side effects may occur. °· You should not drink alcohol, take sleeping pills, or take medicines that cause drowsiness for at least 24 hours. °· If you smoke, do not smoke without supervision. °· If you are feeling better, you may resume normal activities 24 hours after you were sedated. °· Keep all appointments with your health care provider. °SEEK MEDICAL CARE IF: °· Your skin is pale or bluish in color. °· You continue to feel nauseous or vomit. °· Your pain is getting worse and is not helped by medicine. °· You have bleeding or swelling. °· You are still  sleepy or feeling clumsy after 24 hours. °SEEK IMMEDIATE MEDICAL CARE IF: °· You develop a rash. °· You have difficulty breathing. °· You develop any type of allergic problem. °· You have a fever. °MAKE SURE YOU: °· Understand these instructions. °· Will watch your condition. °· Will get help right away if you are not doing well or get worse. °  °This information is not intended to replace advice given to you by your health care provider. Make sure you discuss any questions you have with your health care provider. °  °Document Released: 11/04/2012 Document Revised: 02/04/2014 Document Reviewed: 11/04/2012 °Elsevier Interactive Patient Education ©2016 [employer]. ° ° °

## 2015-07-06 NOTE — Anesthesia Preprocedure Evaluation (Signed)
Anesthesia Evaluation  Patient identified by MRN, date of birth, ID band Patient awake    Reviewed: Allergy & Precautions, H&P , Patient's Chart, lab work & pertinent test results, reviewed documented beta blocker date and time   Airway Mallampati: III  TM Distance: >3 FB Neck ROM: full    Dental no notable dental hx. (+) Edentulous Upper, Dental Advisory Given   Pulmonary sleep apnea , COPD,  COPD inhaler, Current Smoker,    Pulmonary exam normal breath sounds clear to auscultation       Cardiovascular hypertension, Pt. on home beta blockers and Pt. on medications Normal cardiovascular exam Rhythm:regular Rate:Normal  Prolonged QT   Neuro/Psych Depression    GI/Hepatic GERD  Medicated and Controlled,  Endo/Other    Renal/GU      Musculoskeletal   Abdominal   Peds  Hematology   Anesthesia Other Findings   Reproductive/Obstetrics                             Anesthesia Physical  Anesthesia Plan  ASA: III  Anesthesia Plan: General   Post-op Pain Management:    Induction: Intravenous  Airway Management Planned: LMA and Mask  Additional Equipment:   Intra-op Plan:   Post-operative Plan:   Informed Consent: I have reviewed the patients History and Physical, chart, labs and discussed the procedure including the risks, benefits and alternatives for the proposed anesthesia with the patient or authorized representative who has indicated his/her understanding and acceptance.   Dental Advisory Given  Plan Discussed with: Surgeon  Anesthesia Plan Comments: (start with MAC, need for LMA possibly)        Anesthesia Quick Evaluation

## 2015-07-06 NOTE — H&P (Signed)
[facility] Admission History & Physical  Chief Complaint: colon cancer screening HPI: [name] is an 50 y.o.  female.  Who presents for routine colon cancer screening  Past Medical History  Diagnosis Date  . Hyperlipidemia   . COPD (chronic obstructive pulmonary disease) ([place])   . Fibrocystic breast disease   . GERD (gastroesophageal reflux disease)   . Cervical dysplasia or atypia 04-10-11    '93- once dx.-got pregnant-no intervention, then postpartum, no dysplasia found  . Asthma   . Arthritis 04-10-11    hips, shoulders, back  . Skin lesion 03/15/2011    In gluteal crease now s/p removal by Dr. [name] of General Surgery on 3/12. Path shows condyloma.     . 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 Pathology showed Condyloma, gluteal CONDYLOMA ACUMINATUM.   [name] Kitchen Hypertension   . Sleep apnea 04-10-11    uses cpap, pt does not know settings  . Migraine headache     none recent  . Cancer ([place]) 1993    cervical, no treatment done, went away per pt  . CHF (congestive heart failure) ([place])     no cardiologist 2014 dx, none now    Past Surgical History  Procedure Laterality Date  . Neck surgery  04-10-11    x3- cervical fusion with plating and screws-Dr. [name]  . Anal fistulectomy  04/17/2011    Procedure: FISTULECTOMY ANAL;  Surgeon: Imogene Burn. [name], MD;  Location: [facility];  Service: General;  Laterality: N/A;  Excision of Condyloma Gluteal Cleft   . Tee without cardioversion N/A 04/06/2012    Procedure: TRANSESOPHAGEAL ECHOCARDIOGRAM (TEE);  Surgeon: [name], MD;  Location: [facility];  Service: Cardiovascular;  Laterality: N/A;  . Doppler echocardiography  04/06/2012    AT [place] 55-60%  . Back surgery  04-10-11    x5-Lumbar fusion-retained hardware.(Dr. Louanne Skye)  . Breast cyst excision      bilateral breast, 3 cysts removed from each breast    Medications Prior to Admission  Medication Sig Dispense Refill  .  albuterol (PROAIR HFA) 108 (90 Base) MCG/ACT inhaler Inhale 1-2 puffs into the lungs every 4 (four) hours as needed for wheezing or shortness of breath. 8.5 g 1  . amLODipine (NORVASC) 10 MG tablet TAKE 1 TABLET ONCE DAILY FOR BLOOD PRESSURE. 30 tablet 2  . ARIPiprazole (ABILIFY) 15 MG tablet TAKE 1 TABLET ONCE DAILY. 30 tablet 2  . cyclobenzaprine (FLEXERIL) 5 MG tablet TAKE 1 TABLET THREE TIMES DAILY AS NEEDED FOR MUSCLE SPASMS. 30 tablet 2  . gabapentin (NEURONTIN) 300 MG capsule TAKE (1) CAPSULE THREE TIMES DAILY. 90 capsule 2  . hydrochlorothiazide (HYDRODIURIL) 25 MG tablet TAKE 1 TABLET EACH DAY. 30 tablet 2  . lisinopril (PRINIVIL,ZESTRIL) 40 MG tablet Take 40 mg by mouth daily.    [name] Kitchen losartan (COZAAR) 100 MG tablet Take 1 tablet (100 mg total) by mouth daily. 90 tablet 3  . mometasone-formoterol (DULERA) 200-5 MCG/ACT AERO Inhale 2 puffs into the lungs 2 (two) times daily. 8.8 g 0  . NON FORMULARY Uses a C-PAP at bedtime    . omeprazole (PRILOSEC) 20 MG capsule Take 1 capsule (20 mg total) by mouth daily. 30 capsule 3  . pravastatin (PRAVACHOL) 40 MG tablet Take 1 tablet (40 mg total) by mouth daily. $4 [employer] list. 30 tablet 11  . QC ALL DAY ALLERGY 10 MG tablet TAKE 1 TABLET EACH DAY. 30 tablet 0  .  sertraline (ZOLOFT) 50 MG tablet TAKE 2 TABLETS DAILY. (Patient taking differently: TAKE ONE TABLET TWICE DAILY.) 60 tablet 2  . tiotropium (SPIRIVA) 18 MCG inhalation capsule Place 1 capsule (18 mcg total) into inhaler and inhale daily. 30 capsule 12  . traMADol (ULTRAM) 50 MG tablet TAKE 1 TABLET EVERY 8 HOURS AS NEEDED. (Patient taking differently: TAKE 1 TABLET EVERY 8 HOURS AS NEEDED FOR PAIN.) 60 tablet 2    Allergies:  Allergies  Allergen Reactions  . Levofloxacin Itching  . Methadone Hcl Other (See Comments)     "blacked out" in 1990s  . Nicotine Rash and Other (See Comments)    Patch caused a rash    Family History  Problem Relation Age of Onset  . Stroke Father   .  Cancer Maternal Aunt     breast    Social History:  reports that she has been smoking Cigarettes.  She has a 15 pack-year smoking history. She has never used smokeless tobacco. She reports that she drinks alcohol. She reports that she does not use illicit drugs.  Review of Systems: negative except as above   Last menstrual period 01/29/1992. Head: Normocephalic, without obvious abnormality, atraumatic Neck: no adenopathy, no carotid bruit, no JVD, supple, symmetrical, trachea midline and thyroid not enlarged, symmetric, no tenderness/mass/nodules Resp: clear to auscultation bilaterally Cardio: regular rate and rhythm, S1, S2 normal, no murmur, click, rub or gallop GI: unremarkable Extremities: extremities normal, atraumatic, no cyanosis or edema  No results found for this or any previous visit (from the past 48 hour(s)). No results found.  Assessment: Routine colon cancer screening Plan: Procede with colonoscopy  Kateryn Marasigan C 07/06/2015, 10:12 AM

## 2015-07-06 NOTE — Transfer of Care (Signed)
Immediate Anesthesia Transfer of Care Note  Patient: [name]  Procedure(s) Performed: Procedure(s): COLONOSCOPY WITH PROPOFOL (N/A)  Patient Location: PACU  Anesthesia Type:MAC  Level of Consciousness: awake, alert  and oriented  Airway & Oxygen Therapy: Patient Spontanous Breathing and Patient connected to face mask oxygen  Post-op Assessment: Report given to RN and Post -op Vital signs reviewed and stable  Post vital signs: Reviewed and stable  Last Vitals:  Filed Vitals:   07/06/15 1031 07/06/15 1207  BP: 126/86   Pulse: 94   Temp: 36.7 C 36.5 C  Resp: 11 18    Last Pain: There were no vitals filed for this visit.       Complications: No apparent anesthesia complications

## 2015-07-07 ENCOUNTER — Encounter (HOSPITAL_COMMUNITY): Payer: Self-pay | Admitting: Gastroenterology

## 2015-07-07 NOTE — Anesthesia Postprocedure Evaluation (Signed)
Anesthesia Post Note  Patient: [name]  Procedure(s) Performed: Procedure(s) (LRB): COLONOSCOPY WITH PROPOFOL (N/A)  Patient location during evaluation: PACU Anesthesia Type: MAC Level of consciousness: awake and alert Pain management: pain level controlled Vital Signs Assessment: post-procedure vital signs reviewed and stable Respiratory status: spontaneous breathing, nonlabored ventilation, respiratory function stable and patient connected to nasal cannula oxygen Cardiovascular status: stable and blood pressure returned to baseline Anesthetic complications: no    Last Vitals:  Filed Vitals:   07/06/15 1210 07/06/15 1220  BP: 178/95 159/98  Pulse: 107 95  Temp:    Resp: 24 20    Last Pain: There were no vitals filed for this visit.               Riccardo Dubin

## 2015-11-23 ENCOUNTER — Ambulatory Visit (INDEPENDENT_AMBULATORY_CARE_PROVIDER_SITE_OTHER): Payer: Medicare Other | Admitting: Family Medicine

## 2015-11-23 ENCOUNTER — Encounter: Payer: Self-pay | Admitting: Family Medicine

## 2015-11-23 ENCOUNTER — Other Ambulatory Visit (HOSPITAL_COMMUNITY)
Admission: RE | Admit: 2015-11-23 | Discharge: 2015-11-23 | Disposition: A | Discharge status: Home / Self Care | Payer: Medicare Other | Source: Ambulatory Visit | Attending: Family Medicine | Admitting: Family Medicine

## 2015-11-23 VITALS — BP 172/100 | HR 98 | Temp 98.9°F | Wt 337.0 lb

## 2015-11-23 DIAGNOSIS — Z202 Contact with and (suspected) exposure to infections with a predominantly sexual mode of transmission: Secondary | ICD-10-CM | POA: Diagnosis not present

## 2015-11-23 DIAGNOSIS — Z113 Encounter for screening for infections with a predominantly sexual mode of transmission: Secondary | ICD-10-CM | POA: Diagnosis not present

## 2015-11-23 DIAGNOSIS — E785 Hyperlipidemia, unspecified: Secondary | ICD-10-CM

## 2015-11-23 DIAGNOSIS — Z1151 Encounter for screening for human papillomavirus (HPV): Secondary | ICD-10-CM | POA: Insufficient documentation

## 2015-11-23 DIAGNOSIS — Z124 Encounter for screening for malignant neoplasm of cervix: Secondary | ICD-10-CM

## 2015-11-23 DIAGNOSIS — Z01419 Encounter for gynecological examination (general) (routine) without abnormal findings: Secondary | ICD-10-CM | POA: Insufficient documentation

## 2015-11-23 DIAGNOSIS — Z114 Encounter for screening for human immunodeficiency virus [HIV]: Secondary | ICD-10-CM | POA: Diagnosis not present

## 2015-11-23 DIAGNOSIS — F32A Depression, unspecified: Secondary | ICD-10-CM

## 2015-11-23 DIAGNOSIS — R739 Hyperglycemia, unspecified: Secondary | ICD-10-CM

## 2015-11-23 DIAGNOSIS — F329 Major depressive disorder, single episode, unspecified: Secondary | ICD-10-CM

## 2015-11-23 DIAGNOSIS — Z23 Encounter for immunization: Secondary | ICD-10-CM

## 2015-11-23 LAB — CBC
HCT: 52.1 % — ABNORMAL HIGH (ref 35.0–45.0)
Hemoglobin: 18.1 g/dL — ABNORMAL HIGH (ref 11.7–15.5)
MCH: 29.1 pg (ref 27.0–33.0)
MCHC: 34.7 g/dL (ref 32.0–36.0)
MCV: 83.8 fL (ref 80.0–100.0)
MPV: 11.2 fL (ref 7.5–12.5)
PLATELETS: 314 10*3/uL (ref 140–400)
RBC: 6.22 MIL/uL — ABNORMAL HIGH (ref 3.80–5.10)
RDW: 14.8 % (ref 11.0–15.0)
WBC: 11.5 10*3/uL — AB (ref 3.8–10.8)

## 2015-11-23 LAB — POCT GLYCOSYLATED HEMOGLOBIN (HGB A1C): HEMOGLOBIN A1C: 5.7

## 2015-11-23 MED ORDER — ATORVASTATIN CALCIUM 40 MG PO TABS: 40.0000 mg | ORAL_TABLET | Freq: Every day | ORAL | 3 refills | Status: DC

## 2015-11-23 MED ORDER — MOMETASONE FURO-FORMOTEROL FUM 200-5 MCG/ACT IN AERO: 2.0000 | INHALATION_SPRAY | Freq: Two times a day (BID) | RESPIRATORY_TRACT | 0 refills | Status: DC

## 2015-11-23 MED ORDER — ALBUTEROL SULFATE HFA 108 (90 BASE) MCG/ACT IN AERS: 1.0000 | INHALATION_SPRAY | RESPIRATORY_TRACT | 1 refills | Status: DC | PRN

## 2015-11-23 MED ORDER — CITALOPRAM HYDROBROMIDE 20 MG PO TABS: 20.0000 mg | ORAL_TABLET | Freq: Every day | ORAL | 3 refills | Status: DC

## 2015-11-23 MED ORDER — HYDROCHLOROTHIAZIDE 25 MG PO TABS: ORAL_TABLET | ORAL | 2 refills | Status: DC

## 2015-11-23 MED ORDER — AMLODIPINE BESYLATE 10 MG PO TABS: ORAL_TABLET | ORAL | 2 refills | Status: DC

## 2015-11-23 MED ORDER — LOSARTAN POTASSIUM 100 MG PO TABS: 100.0000 mg | ORAL_TABLET | Freq: Every day | ORAL | 3 refills | Status: DC

## 2015-11-23 NOTE — Progress Notes (Signed)
   Subjective:  [name] is a 50 y.o. female who presents to the [facility] today with a chief complaint of STD screening. [name] Kitchen   HPI:  Hypertension BP Readings from Last 3 Encounters:  11/23/15 (!) 172/100  07/06/15 (!) 159/98  05/09/15 (!) 155/86   Home BP monitoring-No Compliant with medications-No. Has not taken medications in several weeks.  ROS-Denies any CP, HA, SOB, blurry vision, LE edema, transient weakness, orthopnea, PND.   STD Screening. Found out her boyfriend was having sex with prostitutes. Would like to be checked for all STDs. No fevers, chills, vaginal discharge, or abdominal pain.  HLD Non compliant with pravastatin.   Depression Not currently taking any medications. Would like to be restarted on antidepressant, but does not want zoloft. Has not tried celexa in the past. No SI or HI. PHQ-9 score of 21 today.   ROS: Per HPI  PMH: Smoking history reviewed.    Objective:  Physical Exam: BP (!) 172/100   Pulse 98   Temp 98.9 F (37.2 C) (Oral)   Wt (!) 337 lb (152.9 kg)   LMP 01/29/1992   BMI 48.35 kg/m   Gen: NAD, resting comfortably CV: RRR with no murmurs appreciated Pulm: NWOB, CTAB with no crackles, wheezes, or rhonchi GI: Obese,  Normal bowel sounds present. Soft, Nontender, Nondistended. GU: Normal external female genitalia. Scant amount of white discharge in vaginal vault. Normal appearing cervix.  MSK: no edema, cyanosis, or clubbing noted Skin: warm, dry Neuro: grossly normal, moves all extremities Psych: Normal affect and thought content  Results for orders placed or performed in visit on 11/23/15 (from the past 72 hour(s))  POCT glycosylated hemoglobin (Hb A1C)     Status: None   Collection Time: 11/23/15  4:54 PM  Result Value Ref Range   Hemoglobin A1C 5.7    Assessment/Plan:  Hypertension Significantly elevated today, though patient has been noncompliant with medications. Will restart amlodipine, losartan, and HCTZ. Check BMP  today. Follow up in 1 week. Recheck BMP at that time.   Depression Start celexa 20mg  daily. PHQ-9 score of 21 today without SI or HI. Patient will be contacting Dr Gwenlyn Saran. Will follow up in 1 week.   Hyperlipidemia Last LDL 143. ASCVD score of 7.9%. Will start atorvastatin 40mg  daily.   STD Screen Will check GC/CT, wet prep, HIV, and RPR.   Algis Greenhouse. Jerline Pain, [place] Resident PGY-3 11/23/2015 5:22 PM

## 2015-11-23 NOTE — Patient Instructions (Signed)
We will check blood work today.  You blood pressure is high. Please restart your medications.  Come back to see me in 1 week to discuss your blood pressure and depression.  Take care,  Dr Jerline Pain

## 2015-11-23 NOTE — Assessment & Plan Note (Signed)
Start celexa 20mg  daily. PHQ-9 score of 21 today without SI or HI. Patient will be contacting Dr Gwenlyn Saran. Will follow up in 1 week.

## 2015-11-23 NOTE — Assessment & Plan Note (Signed)
Significantly elevated today, though patient has been noncompliant with medications. Will restart amlodipine, losartan, and HCTZ. Check BMP today. Follow up in 1 week. Recheck BMP at that time.

## 2015-11-23 NOTE — Assessment & Plan Note (Signed)
Last LDL 143. ASCVD score of 7.9%. Will start atorvastatin 40mg  daily.

## 2015-11-24 LAB — BASIC METABOLIC PANEL WITH GFR
BUN: 16 mg/dL (ref 7–25)
CALCIUM: 9.7 mg/dL (ref 8.6–10.4)
CO2: 25 mmol/L (ref 20–31)
CREATININE: 1.56 mg/dL — AB (ref 0.50–1.05)
Chloride: 107 mmol/L (ref 98–110)
GFR, EST AFRICAN AMERICAN: 44 mL/min — AB (ref >=60)
GFR, Est Non African American: 38 mL/min — ABNORMAL LOW (ref >=60)
Glucose, Bld: 85 mg/dL (ref 65–99)
Potassium: 4.4 mmol/L (ref 3.5–5.3)
SODIUM: 142 mmol/L (ref 135–146)

## 2015-11-24 LAB — CERVICOVAGINAL ANCILLARY ONLY
CHLAMYDIA, DNA PROBE: NEGATIVE
Neisseria Gonorrhea: NEGATIVE
Trichomonas: NEGATIVE

## 2015-11-24 LAB — RPR

## 2015-11-24 LAB — HIV ANTIBODY (ROUTINE TESTING W REFLEX): HIV 1&2 Ab, 4th Generation: NONREACTIVE

## 2015-11-28 LAB — CYTOLOGY - PAP
DIAGNOSIS: NEGATIVE
HPV: NOT DETECTED

## 2015-11-28 LAB — CERVICOVAGINAL ANCILLARY ONLY: Candida vaginitis: NEGATIVE

## 2015-11-30 ENCOUNTER — Ambulatory Visit (INDEPENDENT_AMBULATORY_CARE_PROVIDER_SITE_OTHER): Payer: Medicare Other | Admitting: Family Medicine

## 2015-11-30 ENCOUNTER — Encounter: Payer: Self-pay | Admitting: Family Medicine

## 2015-11-30 VITALS — BP 142/92 | HR 80 | Temp 98.0°F | Wt 330.0 lb

## 2015-11-30 DIAGNOSIS — I10 Essential (primary) hypertension: Secondary | ICD-10-CM | POA: Diagnosis not present

## 2015-11-30 DIAGNOSIS — F32A Depression, unspecified: Secondary | ICD-10-CM

## 2015-11-30 DIAGNOSIS — F329 Major depressive disorder, single episode, unspecified: Secondary | ICD-10-CM

## 2015-11-30 MED ORDER — OMEPRAZOLE 20 MG PO CPDR: 20.0000 mg | DELAYED_RELEASE_CAPSULE | Freq: Every day | ORAL | 3 refills | Status: DC

## 2015-11-30 MED ORDER — CETIRIZINE HCL 10 MG PO TABS: ORAL_TABLET | ORAL | 11 refills | Status: DC

## 2015-11-30 NOTE — Assessment & Plan Note (Addendum)
Seems to be tolerating celexa. It is too early for it to have much of an antidepressive effect. Instructed patient to increase dose to 40mg  over the next week. Will follow up in 2-3 weeks. Will give full 6 week trial before adding other therapy. Patient will be contacting Dr Gwenlyn Saran for an appointment to discuss her depression. PHQ-9 score of 20 today.

## 2015-11-30 NOTE — Assessment & Plan Note (Signed)
Slightly above goal today. Will continue HCTZ, losartan, and amlodipine. Check BMP as all meds were restarted 1 week ago. Follow up in 2-3 weeks, consider additional treatment if still above goal (would avoid beta blockers given history of cocaine abuse).

## 2015-11-30 NOTE — Progress Notes (Signed)
    Subjective:  [name] is a 50 y.o. female who presents to the [facility] today with a chief complaint of HTN.   HPI:  Hypertension BP Readings from Last 3 Encounters:  11/30/15 (!) 142/92  11/23/15 (!) 172/100  07/06/15 (!) 159/98   Home BP monitoring-No Compliant with medications-yes, without side effects ROS-Denies any CP, HA, SOB, blurry vision, LE edema, transient weakness, orthopnea, PND.   Depression Started on celexa at last office visit one week ago. Has tolerated this well without side effects, though is still having significant depressive symptoms including low energy and excessive sleep. No SI or HI. PHQ-9 score of 20 today.   ROS: Per HPI  PMH: Smoking history reviewed.    Objective:  Physical Exam: BP (!) 142/92   Pulse 80   Temp 98 F (36.7 C) (Oral)   Wt (!) 330 lb (149.7 kg)   LMP 01/29/1992   SpO2 100%   BMI 47.35 kg/m   Gen: NAD, resting comfortably CV: RRR with no murmurs appreciated Pulm: NWOB, CTAB with no crackles, wheezes, or rhonchi GI: Obese, Normal bowel sounds present. Soft, Nontender, Nondistended. MSK: no edema, cyanosis, or clubbing noted Skin: warm, dry Neuro: grossly normal, moves all extremities Psych: Normal affect and thought content  Assessment/Plan:  Hypertension Slightly above goal today. Will continue HCTZ, losartan, and amlodipine. Check BMP as all meds were restarted 1 week ago. Follow up in 2-3 weeks, consider additional treatment if still above goal (would avoid beta blockers given history of cocaine abuse).   Depression Seems to be tolerating celexa. It is too early for it to have much of an antidepressive effect. Instructed patient to increase dose to 40mg  over the next week. Will follow up in 2-3 weeks. Will give full 6 week trial before adding other therapy. Patient will be contacting Dr Gwenlyn Saran for an appointment to discuss her depression. PHQ-9 score of 20 today.    Algis Greenhouse. Jerline Pain, [facility]  Resident PGY-3 11/30/2015 9:57 AM

## 2015-11-30 NOTE — Patient Instructions (Signed)
Please increase your dose of celexa to 40mg  (2 tablets) within the next week.  Your blood pressure was still higher than we want it to be. We will continue your current medications today. We will check blood work today to make sure your electrolytes are ok.  Come back to see me in 2-3 weeks, or sooner if you need anything else.  Please schedule an appointment with Dr Gwenlyn Saran when you are ready.  Take care,  Dr Jerline Pain

## 2015-12-01 ENCOUNTER — Encounter: Payer: Self-pay | Admitting: Family Medicine

## 2015-12-01 LAB — BASIC METABOLIC PANEL WITH GFR
BUN: 16 mg/dL (ref 7–25)
CALCIUM: 10 mg/dL (ref 8.6–10.4)
CO2: 26 mmol/L (ref 20–31)
CREATININE: 1.54 mg/dL — AB (ref 0.50–1.05)
Chloride: 102 mmol/L (ref 98–110)
GFR, EST AFRICAN AMERICAN: 44 mL/min — AB (ref >=60)
GFR, EST NON AFRICAN AMERICAN: 39 mL/min — AB (ref >=60)
Glucose, Bld: 90 mg/dL (ref 65–99)
Potassium: 4.3 mmol/L (ref 3.5–5.3)
SODIUM: 140 mmol/L (ref 135–146)

## 2015-12-15 ENCOUNTER — Ambulatory Visit: Payer: Medicare Other | Admitting: Family Medicine

## 2016-01-29 HISTORY — PX: FOOT SURGERY: SHX648

## 2016-03-28 ENCOUNTER — Other Ambulatory Visit: Payer: Self-pay | Admitting: Family Medicine

## 2016-03-29 NOTE — Telephone Encounter (Signed)
Rx filled. Patient needs office appointment.  Algis Greenhouse. Jerline Pain, [place] Resident PGY-3 03/29/2016 1:49 PM

## 2016-04-03 ENCOUNTER — Ambulatory Visit: Payer: Self-pay | Admitting: Family Medicine

## 2016-04-08 ENCOUNTER — Ambulatory Visit: Payer: Self-pay | Admitting: Family Medicine

## 2016-05-03 ENCOUNTER — Ambulatory Visit (HOSPITAL_COMMUNITY)
Admission: RE | Admit: 2016-05-03 | Discharge: 2016-05-03 | Disposition: A | Discharge status: Home / Self Care | Payer: Medicare Other | Source: Ambulatory Visit | Attending: Family Medicine | Admitting: Family Medicine

## 2016-05-03 ENCOUNTER — Encounter: Payer: Self-pay | Admitting: Family Medicine

## 2016-05-03 ENCOUNTER — Ambulatory Visit (INDEPENDENT_AMBULATORY_CARE_PROVIDER_SITE_OTHER): Payer: Medicare Other | Admitting: Family Medicine

## 2016-05-03 VITALS — BP 138/90 | HR 103 | Temp 98.5°F | Ht 70.0 in | Wt 379.0 lb

## 2016-05-03 DIAGNOSIS — E785 Hyperlipidemia, unspecified: Secondary | ICD-10-CM

## 2016-05-03 DIAGNOSIS — R0600 Dyspnea, unspecified: Secondary | ICD-10-CM | POA: Insufficient documentation

## 2016-05-03 DIAGNOSIS — M6530 Trigger finger, unspecified finger: Secondary | ICD-10-CM | POA: Diagnosis not present

## 2016-05-03 DIAGNOSIS — J449 Chronic obstructive pulmonary disease, unspecified: Secondary | ICD-10-CM | POA: Diagnosis not present

## 2016-05-03 DIAGNOSIS — I10 Essential (primary) hypertension: Secondary | ICD-10-CM | POA: Diagnosis not present

## 2016-05-03 DIAGNOSIS — M65321 Trigger finger, right index finger: Secondary | ICD-10-CM | POA: Insufficient documentation

## 2016-05-03 DIAGNOSIS — R739 Hyperglycemia, unspecified: Secondary | ICD-10-CM

## 2016-05-03 DIAGNOSIS — F418 Other specified anxiety disorders: Secondary | ICD-10-CM

## 2016-05-03 MED ORDER — BUSPIRONE HCL 10 MG PO TABS: 10.0000 mg | ORAL_TABLET | Freq: Three times a day (TID) | ORAL | 11 refills | Status: DC

## 2016-05-03 MED ORDER — FUROSEMIDE 10 MG/ML IJ SOLN
20.0000 mg | Freq: Once | INTRAMUSCULAR | Status: AC
  Administered 2016-05-03: 20 mg via INTRAMUSCULAR

## 2016-05-03 MED ORDER — TIZANIDINE HCL 4 MG PO CAPS: 4.0000 mg | ORAL_CAPSULE | Freq: Three times a day (TID) | ORAL | 11 refills | Status: DC

## 2016-05-03 NOTE — Assessment & Plan Note (Signed)
Unclear if her dyspnea is due to progression of COPD or other etiology. She does have risk factors (previously uncontrolled HTN) and symptoms (LE edema, weight gain) for CHF. She does have a diagnosis of diastolic CHF. Her last echo in 2014 showed a normal EF. EKG without signs of LVH. Gave a dose of IM lasix today. Will recheck echocardiogram to rule out CHF. Will also obtain PFTs to monitor for progression of COPD. Follow up in 1 week, or sooner if symptoms worsen.

## 2016-05-03 NOTE — Assessment & Plan Note (Signed)
Patient with more anxiety type symptoms today. Will start Buspar. GAD not obtained today due to time constraints. Follow up in 1 week. Return precautions reviewed.

## 2016-05-03 NOTE — Assessment & Plan Note (Signed)
Continue atorvastatin. Check lipid panel.

## 2016-05-03 NOTE — Assessment & Plan Note (Signed)
Slightly above goal. Continue norvasc, HCTZ.

## 2016-05-03 NOTE — Patient Instructions (Addendum)
Schedule an appointment with Dr Valentina Lucks to get PFTs.  We will give you some lasix to see if that helps with your breathing.  We referred you to orthopedics.  Start the buspar.   We will check blood work today.  I would like for you to have an echocardiogram.   Take the zanaflex as needed for spasms.  Come back to see me in 1-2 weeks.  Take care,  Dr Jerline Pain

## 2016-05-03 NOTE — Assessment & Plan Note (Signed)
Offered injection, however patient deferred. Will refer to orthopedics.

## 2016-05-03 NOTE — Progress Notes (Signed)
Subjective:  [name] is a 51 y.o. female who presents to the [facility] today with a chief complaint of dyspnea.   HPI:  COPD/Dyspnea Patient currently on dulera, spiriva, and ventolin as needed. Reports good compliance with this regimen, though has noticed significantly more shortness of breath over the past weeks. It has gotten to the point now where she gets short of breath walking around her house. She does have some cough. She has not noticed any swelling in her legs. She has not tried laying on her back. No chest pain. No sputum production.   Trigger Finger Patient with a history of trigger fingers in the past. Currently has a trigger finger in her right index and left middle fingers. Wants a referral to orthopedics.   Anxiety Patient also with increased anxiety over the past few weeks. Patient unsure of any inciting event. She has been compliant with celexa, but is not sure if it is helping.   Muscle Pains Patient also with diffuse "spasms" in her arms, legs, and back for the past few weeks. Spasms occur randomly and she is not aware of anything that causes. She has tried eating mustard packs which did not help. She has also tried Goody's powder which has not helped.   Hypertension BP Readings from Last 3 Encounters:  05/03/16 138/90  11/30/15 (!) 142/92  11/23/15 (!) 172/100   Home BP monitoring-yes Compliant with medications-yes, without side effects ROS-Denies any CP, HA, SOB, blurry vision, LE edema, transient weakness, orthopnea, PND.   HLD Tolerating atorvastatin without side effects.   ROS: Per HPI  PMH: Smoking history reviewed.   Objective:  Physical Exam: BP 138/90   Pulse (!) 103   Temp 98.5 F (36.9 C) (Oral)   Ht 5\' 10"  (1.778 m)   Wt (!) 379 lb (171.9 kg)   LMP 04/10/2006   SpO2 98%   BMI 54.38 kg/m   Gen: NAD, resting comfortably, speaking in full sentences.  CV: RRR with no murmurs appreciated Pulm: NWOB, CTAB with no crackles, wheezes, or  rhonchi GI: Obese, Normal bowel sounds present. Soft, Nontender, Nondistended. MSK: 1-2+ edema to knees bilaterally.  Skin: warm, dry Neuro: grossly normal, moves all extremities Psych: Normal affect and thought content  EKG: NSR, no LVH  Assessment/Plan:  COPD (chronic obstructive pulmonary disease) Unclear if her dyspnea is due to progression of COPD or other etiology. She does have risk factors (previously uncontrolled HTN) and symptoms (LE edema, weight gain) for CHF. She does have a diagnosis of diastolic CHF. Her last echo in 2014 showed a normal EF. EKG without signs of LVH. Gave a dose of IM lasix today. Will recheck echocardiogram to rule out CHF. Will also obtain PFTs to monitor for progression of COPD. Follow up in 1 week, or sooner if symptoms worsen.   Trigger finger, acquired Offered injection, however patient deferred. Will refer to orthopedics.   Depression with anxiety Patient with more anxiety type symptoms today. Will start Buspar. GAD not obtained today due to time constraints. Follow up in 1 week. Return precautions reviewed.   Hypertension Slightly above goal. Continue norvasc, HCTZ.   Hyperlipidemia Continue atorvastatin. Check lipid panel.   Muscle Pains Unclear etiology. Will check CBC and CMET. Gave Rx for zanaflex to treat for spasms. Follow up in 1 week. Consider further work up with CK etc if not improving and above labs negative.   Algis Greenhouse. Jerline Pain, [place] Resident PGY-3 05/03/2016 4:52 PM

## 2016-05-04 ENCOUNTER — Other Ambulatory Visit: Payer: Self-pay | Admitting: Family Medicine

## 2016-05-04 LAB — LIPID PANEL
CHOL/HDL RATIO: 4.4 ratio (ref 0.0–4.4)
Cholesterol, Total: 246 mg/dL — ABNORMAL HIGH (ref 100–199)
HDL: 56 mg/dL (ref >39)
LDL CALC: 156 mg/dL — AB (ref 0–99)
Triglycerides: 172 mg/dL — ABNORMAL HIGH (ref 0–149)
VLDL Cholesterol Cal: 34 mg/dL (ref 5–40)

## 2016-05-04 LAB — COMPREHENSIVE METABOLIC PANEL
A/G RATIO: 1.4 (ref 1.2–2.2)
ALBUMIN: 3.9 g/dL (ref 3.5–5.5)
ALT: 26 IU/L (ref 0–32)
AST: 29 IU/L (ref 0–40)
Alkaline Phosphatase: 83 IU/L (ref 39–117)
BUN / CREAT RATIO: 13 (ref 9–23)
BUN: 17 mg/dL (ref 6–24)
Bilirubin Total: 0.3 mg/dL (ref 0.0–1.2)
CALCIUM: 9.4 mg/dL (ref 8.7–10.2)
CO2: 23 mmol/L (ref 18–29)
CREATININE: 1.3 mg/dL — AB (ref 0.57–1.00)
Chloride: 100 mmol/L (ref 96–106)
GFR, EST AFRICAN AMERICAN: 55 mL/min/{1.73_m2} — AB (ref >59)
GFR, EST NON AFRICAN AMERICAN: 47 mL/min/{1.73_m2} — AB (ref >59)
GLOBULIN, TOTAL: 2.8 g/dL (ref 1.5–4.5)
Glucose: 122 mg/dL — ABNORMAL HIGH (ref 65–99)
POTASSIUM: 4.2 mmol/L (ref 3.5–5.2)
SODIUM: 141 mmol/L (ref 134–144)
TOTAL PROTEIN: 6.7 g/dL (ref 6.0–8.5)

## 2016-05-04 LAB — CBC
HEMATOCRIT: 46.3 % (ref 34.0–46.6)
HEMOGLOBIN: 16.4 g/dL — AB (ref 11.1–15.9)
MCH: 30 pg (ref 26.6–33.0)
MCHC: 35.4 g/dL (ref 31.5–35.7)
MCV: 85 fL (ref 79–97)
Platelets: 340 10*3/uL (ref 150–379)
RBC: 5.46 x10E6/uL — AB (ref 3.77–5.28)
RDW: 14.5 % (ref 12.3–15.4)
WBC: 11.3 10*3/uL — AB (ref 3.4–10.8)

## 2016-05-06 ENCOUNTER — Telehealth: Payer: Self-pay | Admitting: *Deleted

## 2016-05-06 NOTE — Telephone Encounter (Signed)
Prior Authorization received from [facility] for Tizanidine.  PA form placed in provider box for completion. Derl Barrow, RN

## 2016-05-07 MED ORDER — TIZANIDINE HCL 4 MG PO TABS: 4.0000 mg | ORAL_TABLET | Freq: Four times a day (QID) | ORAL | 0 refills | Status: DC | PRN

## 2016-05-07 NOTE — Telephone Encounter (Signed)
Formulary alternative sent in.  Algis Greenhouse. Jerline Pain, [place] Resident PGY-3 05/07/2016 12:06 PM

## 2016-05-13 ENCOUNTER — Ambulatory Visit (INDEPENDENT_AMBULATORY_CARE_PROVIDER_SITE_OTHER): Payer: Medicare Other | Admitting: Orthopaedic Surgery

## 2016-05-13 ENCOUNTER — Encounter (INDEPENDENT_AMBULATORY_CARE_PROVIDER_SITE_OTHER): Payer: Self-pay | Admitting: Orthopaedic Surgery

## 2016-05-13 DIAGNOSIS — M65332 Trigger finger, left middle finger: Secondary | ICD-10-CM | POA: Diagnosis not present

## 2016-05-13 DIAGNOSIS — M65321 Trigger finger, right index finger: Secondary | ICD-10-CM | POA: Diagnosis not present

## 2016-05-13 NOTE — Progress Notes (Signed)
Office Visit Note   Patient: [name]           Date of Birth: 26-Aug-1965           MRN: [id number] Visit Date: 05/13/2016              Requested by: Vivi Barrack, MD [address],  [id number] PCP: Dimas Chyle, MD   Assessment & Plan: Visit Diagnoses:  1. Trigger index finger of right hand   2. Trigger finger, left middle finger     Plan: We discussed performing cortisone injections which are usually successful. However patient declines this and wishes to proceed with surgical treatment since she is not responded to injections well in the past. She does have a history of congestive heart failure, obstructive sleep apnea, morbid obesity and previous history of cocaine use. She is not compliant with her CPAP machine. We will get her scheduled for surgery in the near future.  Follow-Up Instructions: Return if symptoms worsen or fail to improve.   Orders:  No orders of the defined types were placed in this encounter.  No orders of the defined types were placed in this encounter.     Procedures: No procedures performed   Clinical Data: No additional findings.   Subjective: Chief Complaint  Patient presents with  . Right Index Finger - Pain  . Left Middle Finger - Pain    Patient is a 51 year old female who comes in with right index and left middle trigger finger for months with worsening. The pain is worse with flexion of the finger with significant triggering and shooting pain up the arm. She has had a previous injection in her right middle finger that did not work ended up having to have surgery.    Review of Systems  Constitutional: Negative.   HENT: Negative.   Eyes: Negative.   Respiratory: Negative.   Cardiovascular: Negative.   Endocrine: Negative.   Musculoskeletal: Negative.   Neurological: Negative.   Hematological: Negative.   Psychiatric/Behavioral: Negative.   All other systems reviewed and are  negative.    Objective: Vital Signs: LMP 04/10/2006   Physical Exam  Constitutional: She is oriented to person, place, and time. She appears well-developed and well-nourished.  HENT:  Head: Normocephalic and atraumatic.  Eyes: EOM are normal.  Neck: Neck supple.  Pulmonary/Chest: Effort normal.  Abdominal: Soft.  Neurological: She is alert and oriented to person, place, and time.  Skin: Skin is warm. Capillary refill takes less than 2 seconds.  Psychiatric: She has a normal mood and affect. Her behavior is normal. Judgment and thought content normal.  Nursing note and vitals reviewed.   Ortho Exam Right hand exam shows active triggering of the right index finger. It is no skin lesions orders evidence of infection. Left hand exam shows triggering of the middle finger. Otherwise exam is benign. Specialty Comments:  No specialty comments available.  Imaging: No results found.   PMFS History: Patient Active Problem List   Diagnosis Date Noted  . Trigger finger, left middle finger 05/13/2016  . Trigger finger, acquired 05/03/2016  . Prediabetes 05/09/2015  . Healthcare maintenance 04/17/2015  . Left low back pain 09/29/2014  . Back pain 04/07/2014  . Substance abuse 11/01/2013  . Chronic diastolic CHF (congestive heart failure) ([place]) 04/07/2012  . Allergic rhinitis 03/29/2011  . GERD 01/26/2010  . MIGRAINE HEADACHE 10/25/2009  . DYSFUNCTIONAL UTERINE BLEEDING 10/25/2009  . ALOPECIA 06/14/2009  . Hypertension 08/04/2008  .  OBSTRUCTIVE SLEEP APNEA 02/03/2008  . FIBROCYSTIC BREAST DISEASE 10/28/2006  . Hyperlipidemia 03/27/2006  . OBESITY, NOS 03/27/2006  . TOBACCO DEPENDENCE 03/27/2006  . Depression with anxiety 03/27/2006  . NEUROGENIC BLADDER 03/27/2006  . COPD (chronic obstructive pulmonary disease) ([place]) 03/27/2006   Past Medical History:  Diagnosis Date  . Arthritis 04-10-11   hips, shoulders, back  . Asthma   . Cancer ([place]) 1993   cervical, no treatment  done, went away per pt  . Cervical dysplasia or atypia 04-10-11   '93- once dx.-got pregnant-no intervention, then postpartum, no dysplasia found  . CHF (congestive heart failure) ([place])    no cardiologist 2014 dx, none now  . 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 Pathology showed Condyloma, gluteal CONDYLOMA ACUMINATUM.   [name] Kitchen COPD (chronic obstructive pulmonary disease) ([place])   . Fibrocystic breast disease   . GERD (gastroesophageal reflux disease)   . Hyperlipidemia   . Hypertension   . Migraine headache    none recent  . Skin lesion 03/15/2011   In gluteal crease now s/p removal by Dr. [name] of General Surgery on 3/12. Path shows condyloma.     . Sleep apnea 04-10-11   uses cpap, pt does not know settings    Family History  Problem Relation Age of Onset  . Stroke Father   . Cancer Maternal Aunt     breast    Past Surgical History:  Procedure Laterality Date  . ANAL FISTULECTOMY  04/17/2011   Procedure: FISTULECTOMY ANAL;  Surgeon: Imogene Burn. [name], MD;  Location: [facility] ORS;  Service: General;  Laterality: N/A;  Excision of Condyloma Gluteal Cleft   . BACK SURGERY  04-10-11   x5-Lumbar fusion-retained hardware.(Dr. Louanne Skye)  . BREAST CYST EXCISION     bilateral breast, 3 cysts removed from each breast  . COLONOSCOPY WITH PROPOFOL N/A 07/06/2015   Procedure: COLONOSCOPY WITH PROPOFOL;  Surgeon: [name], MD;  Location: [facility] ENDOSCOPY;  Service: Endoscopy;  Laterality: N/A;  . DOPPLER ECHOCARDIOGRAPHY  04/06/2012   AT [place] 55-60%  . NECK SURGERY  04-10-11   x3- cervical fusion with plating and screws-Dr. [name]  . TEE WITHOUT CARDIOVERSION N/A 04/06/2012   Procedure: TRANSESOPHAGEAL ECHOCARDIOGRAM (TEE);  Surgeon: [name], MD;  Location: [facility] ENDOSCOPY;  Service: Cardiovascular;  Laterality: N/A;   Social History   Occupational History  . Not on file.   Social History Main Topics  . Smoking status: Current Every Day Smoker    Packs/day:  0.50    Years: 30.00    Types: Cigarettes  . Smokeless tobacco: Never Used  . Alcohol use 0.0 oz/week     Comment: occasionally - about every 3 weeks beer  . Drug use: No     Comment: hx marijuanause many years ago, no other drugs used  . Sexual activity: No

## 2016-05-15 ENCOUNTER — Ambulatory Visit (HOSPITAL_COMMUNITY)
Admission: RE | Admit: 2016-05-15 | Discharge: 2016-05-15 | Disposition: A | Discharge status: Home / Self Care | Payer: Medicare Other | Source: Ambulatory Visit | Attending: Family Medicine | Admitting: Family Medicine

## 2016-05-15 ENCOUNTER — Ambulatory Visit: Payer: Medicare Other | Admitting: Family Medicine

## 2016-05-15 DIAGNOSIS — R0600 Dyspnea, unspecified: Secondary | ICD-10-CM | POA: Insufficient documentation

## 2016-05-15 DIAGNOSIS — I517 Cardiomegaly: Secondary | ICD-10-CM | POA: Insufficient documentation

## 2016-05-15 DIAGNOSIS — I313 Pericardial effusion (noninflammatory): Secondary | ICD-10-CM | POA: Diagnosis not present

## 2016-05-15 MED ORDER — PERFLUTREN LIPID MICROSPHERE
1.0000 mL | INTRAVENOUS | Status: AC | PRN
  Filled 2016-05-15: qty 10

## 2016-05-15 NOTE — Progress Notes (Signed)
  Echocardiogram 2D Echocardiogram has been performed.  [name] 05/15/2016, 3:43 PM

## 2016-05-20 ENCOUNTER — Encounter: Payer: Self-pay | Admitting: Pharmacist

## 2016-05-20 ENCOUNTER — Ambulatory Visit (INDEPENDENT_AMBULATORY_CARE_PROVIDER_SITE_OTHER): Payer: Medicare Other | Admitting: Pharmacist

## 2016-05-20 ENCOUNTER — Encounter: Payer: Self-pay | Admitting: Family Medicine

## 2016-05-20 DIAGNOSIS — J449 Chronic obstructive pulmonary disease, unspecified: Secondary | ICD-10-CM

## 2016-05-20 DIAGNOSIS — Z6841 Body Mass Index (BMI) 40.0 and over, adult: Secondary | ICD-10-CM

## 2016-05-20 DIAGNOSIS — J302 Other seasonal allergic rhinitis: Secondary | ICD-10-CM

## 2016-05-20 DIAGNOSIS — E66813 Obesity, class 3: Secondary | ICD-10-CM

## 2016-05-20 DIAGNOSIS — F17200 Nicotine dependence, unspecified, uncomplicated: Secondary | ICD-10-CM

## 2016-05-20 MED ORDER — FLUTICASONE PROPIONATE 50 MCG/ACT NA SUSP: 2.0000 | Freq: Two times a day (BID) | NASAL | 6 refills | Status: DC

## 2016-05-20 NOTE — Patient Instructions (Addendum)
Thank you for coming in today!   The lung function tests showed mild restriction.  Bring your diet pills to the next visit so we can tell what you are taking.   For your allergies, get a bottle of saline to clear out your nose. You can use it every hour. We will send a prescription for flonase to your pharmacy. Start taking Flonase two sprays in each nostril two times a day and continue to take Zyrtec allergy pill every day.   We will call you at the end of the week to see how you are doing with smoking cessation. You will be seeing your primary care doctor after our visit today.

## 2016-05-20 NOTE — Assessment & Plan Note (Signed)
Smoking:  Patient is very motivated to quit smoking and has a good support system to aid her. She stated that no medications have helped her in the past, and she is motivated to quit without NRT or other medications. She understands the importance of quitting to improve her lung function, and stated she has to quite because she "does not have a life" due to her shortness of breath. She will quit when she runs out of cigarettes, which will be later today. Patient agreed with plan of pharmacy calling her at the end of the week to see how her smoking cessation is going.

## 2016-05-20 NOTE — Progress Notes (Signed)
   S:    Patient arrives in good spirits. She is out of breath after walking to exam room. Presents for lung function evaluation. Patient was referred on 05/03/16.  Patient was last seen by Primary Care Provider on 05/03/16.   Patient reports breathing started getting bad in December of 2017 after being diagnosed with "CHF". She reports gaining 25 lbs in the last 2 months which has also worsened her breathing. She reports getting SOB when doing ADL and is unable to perform ADLs without sitting down to take a rest. She reports SOB is negatively impacting her life. She reports swelling on her left side. She reports worsening allergies over the past 2 months which has also worsened her breathing. Patient reports smoking since she was 51 yo, 0.5 ppd on average. She reports wanting to quit smoking. She reports making some changes to her diet recently such as cutting out chips and soda, and has been taking a diet pill she ordered on Facebook and cannot remember the name of it. She requests a new CPAP machine mask because hers does not fit her face properly.   Patient reports last dose of COPD medications was yesterday morning. O2 sats were 92-93% with walking slowly around the building, and pulse was up to 114 bpm.  O: mMRC score= >2 CAT score= 40 See Documentation Flowsheet - CAT/COPD for complete symptom scoring.  See "scanned report" or Documentation Flowsheet (discrete results - PFTs) for  Spirometry results. Patient provided good effort while attempting spirometry.   Lung Age = 90 Albuterol Neb  Lot# [id number]     Exp. [id number]  A/P:  Shortness of breath: Spirometry evaluation reveals Mild restrictive lung disease. Post nebulized albuterol tx revealed no significant improvement.  Patient has been experiencing SOB since December 2017 and has been worsening significantly since February 2018. Allergies and fluid overload secondary to CHF are contributing to worsening of breathing. She is taking Spiriva BID,  Dulera daily and albuterol BID. Educated patient on purpose, proper use, potential adverse effects including risk of esophageal candidiasis and need to rinse mouth after each use.  Reviewed results of pulmonary function tests.  Pt verbalized understanding of results and education.  Written pt instructions provided.  F/U Clinic visit is this afternoon with her PCP.  Smoking:  Patient is very motivated to quit smoking and has a good support system to aid her. She stated that no medications have helped her in the past, and she is motivated to quit without NRT or other medications. She understands the importance of quitting to improve her lung function, and stated she has to quite because she "does not have a life" due to her shortness of breath. She will quit when she runs out of cigarettes, which will be later today. Patient agreed with plan of pharmacy calling her at the end of the week to see how her smoking cessation is going.   Obesity/Weight loss:  Patient states she is trying to lose weight but has been having trouble due to shortness of breath. Plan is as above to improve shortness of breath so she can begin exercising. She also agreed with plan to bring in weight-loss supplement to next clinic visit so that her provider can verify what is in them and determine safety.  Patient seen with Ida Rogue, PharmD Candidate, and Demetrius Charity, PharmD PGY-1 Resident. [name] Kitchen

## 2016-05-20 NOTE — Assessment & Plan Note (Signed)
Obesity/Weight loss:  Patient states she is trying to lose weight but has been having trouble due to shortness of breath. Plan is as above to improve shortness of breath so she can begin exercising. She also agreed with plan to bring in weight-loss supplement to next clinic visit so that her provider can verify what is in them and determine safety.

## 2016-05-20 NOTE — Assessment & Plan Note (Signed)
Shortness of breath: Spirometry evaluation reveals Mild restrictive lung disease. Post nebulized albuterol tx revealed no significant improvement.  Patient has been experiencing SOB since December 2017 and has been worsening significantly since February 2018. Allergies and fluid overload secondary to CHF are contributing to worsening of breathing. She is taking Spiriva BID, Dulera daily and albuterol BID. Educated patient on purpose, proper use, potential adverse effects including risk of esophageal candidiasis and need to rinse mouth after each use.  Reviewed results of pulmonary function tests.  Pt verbalized understanding of results and education.  Written pt instructions provided.  F/U Clinic visit is this afternoon with her PCP.  Smoking:  Patient is very motivated to quit smoking and has a good support system to aid her. She stated that no medications have helped her in the past, and she is motivated to quit without NRT or other medications. She understands the importance of quitting to improve her lung function, and stated she has to quite because she "does not have a life" due to her shortness of breath. She will quit when she runs out of cigarettes, which will be later today. Patient agreed with plan of pharmacy calling her at the end of the week to see how her smoking cessation is going.

## 2016-05-22 ENCOUNTER — Encounter: Payer: Self-pay | Admitting: Family Medicine

## 2016-05-22 ENCOUNTER — Ambulatory Visit (INDEPENDENT_AMBULATORY_CARE_PROVIDER_SITE_OTHER): Payer: Medicare Other | Admitting: Family Medicine

## 2016-05-22 VITALS — BP 120/78 | HR 93 | Temp 98.9°F | Ht 70.0 in | Wt 372.0 lb

## 2016-05-22 DIAGNOSIS — Z9989 Dependence on other enabling machines and devices: Secondary | ICD-10-CM

## 2016-05-22 DIAGNOSIS — I10 Essential (primary) hypertension: Secondary | ICD-10-CM

## 2016-05-22 DIAGNOSIS — R0600 Dyspnea, unspecified: Secondary | ICD-10-CM | POA: Diagnosis not present

## 2016-05-22 DIAGNOSIS — E7800 Pure hypercholesterolemia, unspecified: Secondary | ICD-10-CM

## 2016-05-22 DIAGNOSIS — G4733 Obstructive sleep apnea (adult) (pediatric): Secondary | ICD-10-CM

## 2016-05-22 MED ORDER — MONTELUKAST SODIUM 10 MG PO TABS: 10.0000 mg | ORAL_TABLET | Freq: Every day | ORAL | 3 refills | Status: DC

## 2016-05-22 NOTE — Patient Instructions (Signed)
We will start you on singulair.  Come back to see me in a few weeks, or sooner if you are not getting better.  Take care,  Dr Jerline Pain

## 2016-05-22 NOTE — Progress Notes (Signed)
    Subjective:  [name] is a 51 y.o. female who presents to the [facility] today with a chief complaint of dyspnea.   HPI:  Dyspnea Long standing history for patient thought to be related to COPD. She was seen 3 weeks ago with significant worsening of her symptoms. She had an echo which revealed only grade 1 diastolic dysfunction with preserved EF and no signs of right heart failure or increased pulmonary pressure. She had PFTs which revealed a restrictive pattern with no improvement with albuterol. She reports the dose of lasix she received did not help. She is currently on dulera, spiriva, and ventolin which help with her symptoms. She is currently trying to stop smoking and is down to about 10 cigarettes per day. Patient thinks her allergies have made her symptoms worse.  Hypertension BP Readings from Last 3 Encounters:  05/22/16 120/78  05/20/16 (!) 144/82  05/03/16 138/90   Home BP monitoring-Yes Compliant with medications-yes, without side effects ROS-Denies any CP, HA, blurry vision, LE edema, transient weakness, orthopnea, PND.   HLD Tolerating atorvastatin without side effects.  OSA on CPAP Needs Rx for a new face mask. Reports good compliance.   ROS: Per HPI  PMH: Smoking history reviewed.   Objective:  Physical Exam: BP 120/78   Pulse 93   Temp 98.9 F (37.2 C) (Oral)   Ht 5\' 10"  (1.778 m)   Wt (!) 372 lb (168.7 kg)   LMP 04/10/2006   SpO2 97%   BMI 53.38 kg/m   Gen: NAD, resting comfortably, speaking in full sentences CV: RRR with no murmurs appreciated Pulm: NWOB, CTAB with no crackles, wheezes, or rhonchi Skin: warm, dry Neuro: grossly normal, moves all extremities Psych: Normal affect and thought content  Assessment/Plan:  Dyspnea Symptoms somewhat improved today. Her PFTs show a restrictive pattern likely secondary to obesity and her echo cardiogram showed preserved EF with grade 1 diastolic dysfunction. Her dyspnea is likely multifactorial in the  setting of obesity, tobacco abuse, HFpEF. Will start singulair today to treat any underlying allergic component (patient strongly feels this is the case). Follow up in 2 weeks. Consider addition of daily diuresis to see if it improves symptoms. If still no improvement, would consider CTA to evaluate for intrinsic lund disease and any signs of chronic PE.   Hypertension At goal. Continue HCTZ and losartan.   Hyperlipidemia LDL 156. Due to not being on atorvastatin for several months. Will continue atorvastatin at current dose. Consider direct LDL in 6 months.   OBSTRUCTIVE SLEEP APNEA Order for CPAP mask given to patient today.    Algis Greenhouse. Jerline Pain, [place] Resident PGY-3 05/22/2016 11:10 AM

## 2016-05-22 NOTE — Assessment & Plan Note (Signed)
Order for CPAP mask given to patient today.

## 2016-05-22 NOTE — Assessment & Plan Note (Signed)
Symptoms somewhat improved today. Her PFTs show a restrictive pattern likely secondary to obesity and her echo cardiogram showed preserved EF with grade 1 diastolic dysfunction. Her dyspnea is likely multifactorial in the setting of obesity, tobacco abuse, HFpEF. Will start singulair today to treat any underlying allergic component (patient strongly feels this is the case). Follow up in 2 weeks. Consider addition of daily diuresis to see if it improves symptoms. If still no improvement, would consider CTA to evaluate for intrinsic lund disease and any signs of chronic PE.

## 2016-05-22 NOTE — Assessment & Plan Note (Signed)
At goal. Continue HCTZ and losartan.

## 2016-05-22 NOTE — Assessment & Plan Note (Signed)
LDL 156. Due to not being on atorvastatin for several months. Will continue atorvastatin at current dose. Consider direct LDL in 6 months.

## 2016-05-24 ENCOUNTER — Telehealth: Payer: Self-pay | Admitting: Pharmacist

## 2016-05-24 NOTE — Telephone Encounter (Signed)
Called in follow up of tobacco cessation plan to quit in the past few days.  Patient reports quittiing for more than 36 hours.  She states she is having some nicotine withdrawal.  She was congratulated on success and she was encouraged to continue with her quit attempt.   Plan follow-up by phone in 1 week.

## 2016-05-27 NOTE — Progress Notes (Signed)
Patient ID: [name], female   DOB: [birth year], 51 y.o.   MRN: [id number] Reviewed: Agree with Dr. Graylin Shiver documentation and management.

## 2016-05-31 ENCOUNTER — Telehealth: Payer: Self-pay | Admitting: Pharmacist

## 2016-05-31 NOTE — Telephone Encounter (Signed)
Phone F/U of tobacco cessation.  Patient reports no smoking for 10 days!  She states she believes she is through withdrawal.  She was positive and we discussed the need to stay focused on 1 day at a time.  We emphasized she can get through life stress without relapse.   She plans to follow up with Dr. Jerline Pain Tuesday.     I wished her luck with continued success at quitting tobacco! She expressed thankfulness for the phone call.

## 2016-06-04 ENCOUNTER — Ambulatory Visit: Payer: Medicare Other | Admitting: Family Medicine

## 2016-06-11 ENCOUNTER — Encounter (HOSPITAL_COMMUNITY): Payer: Self-pay | Admitting: *Deleted

## 2016-06-11 ENCOUNTER — Other Ambulatory Visit (INDEPENDENT_AMBULATORY_CARE_PROVIDER_SITE_OTHER): Payer: Self-pay | Admitting: Orthopaedic Surgery

## 2016-06-11 ENCOUNTER — Other Ambulatory Visit (INDEPENDENT_AMBULATORY_CARE_PROVIDER_SITE_OTHER): Payer: Self-pay

## 2016-06-11 DIAGNOSIS — M65321 Trigger finger, right index finger: Secondary | ICD-10-CM

## 2016-06-12 ENCOUNTER — Encounter (HOSPITAL_COMMUNITY)
Admission: RE | Disposition: A | Discharge status: Home / Self Care | Payer: Self-pay | Source: Ambulatory Visit | Attending: Orthopaedic Surgery

## 2016-06-12 ENCOUNTER — Ambulatory Visit (HOSPITAL_COMMUNITY): Payer: Medicare Other | Admitting: Anesthesiology

## 2016-06-12 ENCOUNTER — Encounter (HOSPITAL_COMMUNITY): Payer: Self-pay | Admitting: Certified Registered Nurse Anesthetist

## 2016-06-12 ENCOUNTER — Ambulatory Visit (HOSPITAL_COMMUNITY)
Admission: RE | Admit: 2016-06-12 | Discharge: 2016-06-12 | Disposition: A | Discharge status: Home / Self Care | Payer: Medicare Other | Source: Ambulatory Visit | Attending: Orthopaedic Surgery | Admitting: Orthopaedic Surgery

## 2016-06-12 DIAGNOSIS — M19012 Primary osteoarthritis, left shoulder: Secondary | ICD-10-CM | POA: Diagnosis not present

## 2016-06-12 DIAGNOSIS — F319 Bipolar disorder, unspecified: Secondary | ICD-10-CM | POA: Insufficient documentation

## 2016-06-12 DIAGNOSIS — F17210 Nicotine dependence, cigarettes, uncomplicated: Secondary | ICD-10-CM | POA: Insufficient documentation

## 2016-06-12 DIAGNOSIS — I110 Hypertensive heart disease with heart failure: Secondary | ICD-10-CM | POA: Diagnosis not present

## 2016-06-12 DIAGNOSIS — Z7952 Long term (current) use of systemic steroids: Secondary | ICD-10-CM | POA: Insufficient documentation

## 2016-06-12 DIAGNOSIS — Z7982 Long term (current) use of aspirin: Secondary | ICD-10-CM | POA: Diagnosis not present

## 2016-06-12 DIAGNOSIS — Z803 Family history of malignant neoplasm of breast: Secondary | ICD-10-CM | POA: Insufficient documentation

## 2016-06-12 DIAGNOSIS — Z881 Allergy status to other antibiotic agents status: Secondary | ICD-10-CM | POA: Insufficient documentation

## 2016-06-12 DIAGNOSIS — J449 Chronic obstructive pulmonary disease, unspecified: Secondary | ICD-10-CM | POA: Diagnosis not present

## 2016-06-12 DIAGNOSIS — M160 Bilateral primary osteoarthritis of hip: Secondary | ICD-10-CM | POA: Diagnosis not present

## 2016-06-12 DIAGNOSIS — M65321 Trigger finger, right index finger: Secondary | ICD-10-CM | POA: Diagnosis not present

## 2016-06-12 DIAGNOSIS — Z888 Allergy status to other drugs, medicaments and biological substances status: Secondary | ICD-10-CM | POA: Diagnosis not present

## 2016-06-12 DIAGNOSIS — K219 Gastro-esophageal reflux disease without esophagitis: Secondary | ICD-10-CM | POA: Diagnosis not present

## 2016-06-12 DIAGNOSIS — G4733 Obstructive sleep apnea (adult) (pediatric): Secondary | ICD-10-CM | POA: Diagnosis not present

## 2016-06-12 DIAGNOSIS — E785 Hyperlipidemia, unspecified: Secondary | ICD-10-CM | POA: Diagnosis not present

## 2016-06-12 DIAGNOSIS — Z79899 Other long term (current) drug therapy: Secondary | ICD-10-CM | POA: Diagnosis not present

## 2016-06-12 DIAGNOSIS — G4730 Sleep apnea, unspecified: Secondary | ICD-10-CM | POA: Diagnosis not present

## 2016-06-12 DIAGNOSIS — Z8541 Personal history of malignant neoplasm of cervix uteri: Secondary | ICD-10-CM | POA: Insufficient documentation

## 2016-06-12 DIAGNOSIS — Z823 Family history of stroke: Secondary | ICD-10-CM | POA: Insufficient documentation

## 2016-06-12 DIAGNOSIS — M19011 Primary osteoarthritis, right shoulder: Secondary | ICD-10-CM | POA: Diagnosis not present

## 2016-06-12 DIAGNOSIS — Z6841 Body Mass Index (BMI) 40.0 and over, adult: Secondary | ICD-10-CM | POA: Diagnosis not present

## 2016-06-12 HISTORY — DX: Polyneuropathy, unspecified: G62.9

## 2016-06-12 HISTORY — PX: TRIGGER FINGER RELEASE: SHX641

## 2016-06-12 HISTORY — DX: Bipolar disorder, unspecified: F31.9

## 2016-06-12 HISTORY — DX: Depression, unspecified: F32.A

## 2016-06-12 HISTORY — DX: Dyspnea, unspecified: R06.00

## 2016-06-12 LAB — BASIC METABOLIC PANEL
ANION GAP: 10 (ref 5–15)
BUN: 11 mg/dL (ref 6–20)
CALCIUM: 9 mg/dL (ref 8.9–10.3)
CO2: 24 mmol/L (ref 22–32)
Chloride: 105 mmol/L (ref 101–111)
Creatinine, Ser: 1.44 mg/dL — ABNORMAL HIGH (ref 0.44–1.00)
GFR calc Af Amer: 47 mL/min — ABNORMAL LOW (ref >60)
GFR calc non Af Amer: 41 mL/min — ABNORMAL LOW (ref >60)
GLUCOSE: 109 mg/dL — AB (ref 65–99)
POTASSIUM: 3.7 mmol/L (ref 3.5–5.1)
Sodium: 139 mmol/L (ref 135–145)

## 2016-06-12 LAB — CBC
HEMATOCRIT: 47.1 % — AB (ref 36.0–46.0)
HEMOGLOBIN: 16.6 g/dL — AB (ref 12.0–15.0)
MCH: 29.2 pg (ref 26.0–34.0)
MCHC: 35.2 g/dL (ref 30.0–36.0)
MCV: 82.8 fL (ref 78.0–100.0)
Platelets: 313 10*3/uL (ref 150–400)
RBC: 5.69 MIL/uL — ABNORMAL HIGH (ref 3.87–5.11)
RDW: 13.3 % (ref 11.5–15.5)
WBC: 8.8 10*3/uL (ref 4.0–10.5)

## 2016-06-12 SURGERY — RELEASE, A1 PULLEY, FOR TRIGGER FINGER
Anesthesia: Monitor Anesthesia Care | Site: Hand | Laterality: Right

## 2016-06-12 MED ORDER — HYDROCODONE-ACETAMINOPHEN 5-325 MG PO TABS: 1.0000 | ORAL_TABLET | Freq: Four times a day (QID) | ORAL | 0 refills | Status: DC | PRN

## 2016-06-12 MED ORDER — PHENYLEPHRINE 40 MCG/ML (10ML) SYRINGE FOR IV PUSH (FOR BLOOD PRESSURE SUPPORT)
PREFILLED_SYRINGE | INTRAVENOUS | Status: AC
  Filled 2016-06-12: qty 10

## 2016-06-12 MED ORDER — CEFAZOLIN SODIUM-DEXTROSE 2-4 GM/100ML-% IV SOLN
INTRAVENOUS | Status: AC
  Filled 2016-06-12: qty 100

## 2016-06-12 MED ORDER — BUPIVACAINE HCL (PF) 0.25 % IJ SOLN
INTRAMUSCULAR | Status: AC
  Filled 2016-06-12: qty 30

## 2016-06-12 MED ORDER — ONDANSETRON HCL 4 MG/2ML IJ SOLN
INTRAMUSCULAR | Status: AC
  Filled 2016-06-12: qty 2

## 2016-06-12 MED ORDER — CEFAZOLIN SODIUM-DEXTROSE 2-4 GM/100ML-% IV SOLN
2.0000 g | INTRAVENOUS | Status: AC
  Administered 2016-06-12: 2 g via INTRAVENOUS

## 2016-06-12 MED ORDER — FENTANYL CITRATE (PF) 250 MCG/5ML IJ SOLN
INTRAMUSCULAR | Status: AC
  Filled 2016-06-12: qty 5

## 2016-06-12 MED ORDER — MIDAZOLAM HCL 2 MG/2ML IJ SOLN
INTRAMUSCULAR | Status: AC
  Filled 2016-06-12: qty 2

## 2016-06-12 MED ORDER — PROMETHAZINE HCL 25 MG/ML IJ SOLN: 6.2500 mg | INTRAMUSCULAR | Status: DC | PRN

## 2016-06-12 MED ORDER — DEXAMETHASONE SODIUM PHOSPHATE 10 MG/ML IJ SOLN
INTRAMUSCULAR | Status: AC
  Filled 2016-06-12: qty 1

## 2016-06-12 MED ORDER — SODIUM CHLORIDE 0.9 % IR SOLN
Status: DC | PRN
  Administered 2016-06-12: 1000 mL

## 2016-06-12 MED ORDER — LIDOCAINE 2% (20 MG/ML) 5 ML SYRINGE
INTRAMUSCULAR | Status: AC
  Filled 2016-06-12: qty 5

## 2016-06-12 MED ORDER — PHENYLEPHRINE 40 MCG/ML (10ML) SYRINGE FOR IV PUSH (FOR BLOOD PRESSURE SUPPORT)
PREFILLED_SYRINGE | INTRAVENOUS | Status: AC
  Filled 2016-06-12: qty 20

## 2016-06-12 MED ORDER — PROPOFOL 10 MG/ML IV BOLUS
INTRAVENOUS | Status: DC | PRN
  Administered 2016-06-12: 200 mg via INTRAVENOUS

## 2016-06-12 MED ORDER — BUPIVACAINE HCL (PF) 0.25 % IJ SOLN
INTRAMUSCULAR | Status: DC | PRN
  Administered 2016-06-12: 8 mL

## 2016-06-12 MED ORDER — PROPOFOL 10 MG/ML IV BOLUS
INTRAVENOUS | Status: AC
  Filled 2016-06-12: qty 20

## 2016-06-12 MED ORDER — LIDOCAINE 2% (20 MG/ML) 5 ML SYRINGE
INTRAMUSCULAR | Status: DC | PRN
  Administered 2016-06-12: 100 mg via INTRAVENOUS

## 2016-06-12 MED ORDER — ONDANSETRON HCL 4 MG/2ML IJ SOLN
INTRAMUSCULAR | Status: DC | PRN
Start: 2016-06-12 — End: 2016-06-12
  Administered 2016-06-12: 4 mg via INTRAVENOUS

## 2016-06-12 MED ORDER — LACTATED RINGERS IV SOLN: INTRAVENOUS | Status: DC

## 2016-06-12 MED ORDER — FENTANYL CITRATE (PF) 100 MCG/2ML IJ SOLN
INTRAMUSCULAR | Status: DC | PRN
  Administered 2016-06-12: 50 ug via INTRAVENOUS
  Administered 2016-06-12 (×2): 25 ug via INTRAVENOUS
  Administered 2016-06-12: 50 ug via INTRAVENOUS

## 2016-06-12 MED ORDER — HYDROMORPHONE HCL 1 MG/ML IJ SOLN: 0.2500 mg | INTRAMUSCULAR | Status: DC | PRN

## 2016-06-12 MED ORDER — LACTATED RINGERS IV SOLN
INTRAVENOUS | Status: DC
  Administered 2016-06-12 (×2): via INTRAVENOUS

## 2016-06-12 MED ORDER — DEXAMETHASONE SODIUM PHOSPHATE 10 MG/ML IJ SOLN
INTRAMUSCULAR | Status: DC | PRN
  Administered 2016-06-12: 10 mg via INTRAVENOUS

## 2016-06-12 SURGICAL SUPPLY — 31 items
BANDAGE ACE 3X5.8 VEL STRL LF (GAUZE/BANDAGES/DRESSINGS) IMPLANT
BNDG CMPR 9X4 STRL LF SNTH (GAUZE/BANDAGES/DRESSINGS) ×1
BNDG COHESIVE 4X5 TAN STRL (GAUZE/BANDAGES/DRESSINGS) ×1 IMPLANT
BNDG CONFORM 3 STRL LF (GAUZE/BANDAGES/DRESSINGS) ×1 IMPLANT
BNDG ESMARK 4X9 LF (GAUZE/BANDAGES/DRESSINGS) ×2 IMPLANT
CORDS BIPOLAR (ELECTRODE) ×1 IMPLANT
COVER SURGICAL LIGHT HANDLE (MISCELLANEOUS) ×2 IMPLANT
CUFF TOURNIQUET SINGLE 18IN (TOURNIQUET CUFF) ×1 IMPLANT
DRAPE SURG 17X23 STRL (DRAPES) ×1 IMPLANT
DURAPREP 26ML APPLICATOR (WOUND CARE) ×2 IMPLANT
ELECT REM PT RETURN 9FT ADLT (ELECTROSURGICAL) ×2
ELECTRODE REM PT RTRN 9FT ADLT (ELECTROSURGICAL) IMPLANT
GAUZE SPONGE 4X4 12PLY STRL (GAUZE/BANDAGES/DRESSINGS) ×2 IMPLANT
GAUZE SPONGE 4X4 12PLY STRL LF (GAUZE/BANDAGES/DRESSINGS) ×1 IMPLANT
GAUZE XEROFORM 1X8 LF (GAUZE/BANDAGES/DRESSINGS) ×2 IMPLANT
GLOVE SKINSENSE NS SZ7.5 (GLOVE) ×2
GLOVE SKINSENSE STRL SZ7.5 (GLOVE) ×2 IMPLANT
GOWN STRL REIN XL XLG (GOWN DISPOSABLE) ×4 IMPLANT
KIT BASIN OR (CUSTOM PROCEDURE TRAY) ×2 IMPLANT
KIT ROOM TURNOVER OR (KITS) ×2 IMPLANT
NS IRRIG 1000ML POUR BTL (IV SOLUTION) ×4 IMPLANT
PACK ORTHO EXTREMITY (CUSTOM PROCEDURE TRAY) ×2 IMPLANT
PAD ARMBOARD 7.5X6 YLW CONV (MISCELLANEOUS) ×2 IMPLANT
PADDING CAST ABS 4INX4YD NS (CAST SUPPLIES) ×2
PADDING CAST ABS COTTON 4X4 ST (CAST SUPPLIES) ×2 IMPLANT
SUT ETHILON 4 0 PS 2 18 (SUTURE) ×3 IMPLANT
SYR CONTROL 10ML LL (SYRINGE) IMPLANT
TOWEL OR 17X24 6PK STRL BLUE (TOWEL DISPOSABLE) ×2 IMPLANT
TOWEL OR 17X26 10 PK STRL BLUE (TOWEL DISPOSABLE) ×2 IMPLANT
UNDERPAD 30X30 (UNDERPADS AND DIAPERS) ×4 IMPLANT
WATER STERILE IRR 1000ML POUR (IV SOLUTION) ×2 IMPLANT

## 2016-06-12 NOTE — Transfer of Care (Signed)
Immediate Anesthesia Transfer of Care Note  Patient: [name]  Procedure(s) Performed: Procedure(s): RIGHT INDEX FINGER TRIGGER RELEASE (Right)  Patient Location: PACU  Anesthesia TypeGeneral  Level of Consciousness: awake, patient cooperative and responds to stimulation  Airway & Oxygen Therapy: Patient Spontanous Breathing and Patient connected to nasal cannula oxygen  Post-op Assessment: Report given to RN and Post -op Vital signs reviewed and stable  Post vital signs: Reviewed and stable  Last Vitals:  Vitals:   06/12/16 0924 06/12/16 1215  BP: 121/71 (P) 124/89  Pulse: 84 (P) 77  Resp: 20 (P) 15  Temp: 37 C (P) 36.4 C    Last Pain:  Vitals:   06/12/16 1000  TempSrc:   PainSc: 7       Patients Stated Pain Goal: 3 (81/38/87 1959)  Complications: No apparent anesthesia complications

## 2016-06-12 NOTE — Discharge Instructions (Signed)
Postoperative instructions:  Weightbearing instructions: as tolerated.  No heavy lifting for 4 weeks  Keep your dressing and/or splint clean and dry at all times.  You can remove your dressing on post-operative day #3 and change with a dry/sterile dressing or Band-Aids as needed thereafter.    Incision instructions:  Do not soak your incision for 3 weeks after surgery.  If the incision gets wet, pat dry and do not scrub the incision.  Pain control:  You have been given a prescription to be taken as directed for post-operative pain control.  In addition, elevate the operative extremity above the heart at all times to prevent swelling and throbbing pain.  Take over-the-counter Colace, 100mg  by mouth twice a day while taking narcotic pain medications to help prevent constipation.  Follow up appointments: 1) 10-14 days for suture removal and wound check. 2) Dr. Erlinda Hong as scheduled.   -------------------------------------------------------------------------------------------------------------  After Surgery Pain Control:  After your surgery, post-surgical discomfort or pain is likely. This discomfort can last several days to a few weeks. At certain times of the day your discomfort may be more intense.  Did you receive a nerve block?  A nerve block can provide pain relief for one hour to two days after your surgery. As long as the nerve block is working, you will experience little or no sensation in the area the surgeon operated on.  As the nerve block wears off, you will begin to experience pain or discomfort. It is very important that you begin taking your prescribed pain medication before the nerve block fully wears off. Treating your pain at the first sign of the block wearing off will ensure your pain is better controlled and more tolerable when full-sensation returns. Do not wait until the pain is intolerable, as the medicine will be less effective. It is better to treat pain in advance than to  try and catch up.  General Anesthesia:  If you did not receive a nerve block during your surgery, you will need to start taking your pain medication shortly after your surgery and should continue to do so as prescribed by your surgeon.  Pain Medication:  Most commonly we prescribe Vicodin and Percocet for post-operative pain. Both of these medications contain a combination of acetaminophen (Tylenol) and a narcotic to help control pain.   It takes between 30 and 45 minutes before pain medication starts to work. It is important to take your medication before your pain level gets too intense.   Nausea is a common side effect of many pain medications. You will want to eat something before taking your pain medicine to help prevent nausea.   If you are taking a prescription pain medication that contains acetaminophen, we recommend that you do not take additional over the counter acetaminophen (Tylenol).  Other pain relieving options:   Using a cold pack to ice the affected area a few times a day (15 to 20 minutes at a time) can help to relieve pain, reduce swelling and bruising.   Elevation of the affected area can also help to reduce pain and swelling.         Post Anesthesia Home Care Instructions  Activity: Get plenty of rest for the remainder of the day. A responsible individual must stay with you for 24 hours following the procedure.  For the next 24 hours, DO NOT: -Drive a car -Paediatric nurse -Drink alcoholic beverages -Take any medication unless instructed by your physician -Make any legal decisions or sign important  papers.  Meals: Start with liquid foods such as gelatin or soup. Progress to regular foods as tolerated. Avoid greasy, spicy, heavy foods. If nausea and/or vomiting occur, drink only clear liquids until the nausea and/or vomiting subsides. Call your physician if vomiting continues.  Special Instructions/Symptoms: Your throat may feel dry or sore from the  anesthesia or the breathing tube placed in your throat during surgery. If this causes discomfort, gargle with warm salt water. The discomfort should disappear within 24 hours.  If you had a scopolamine patch placed behind your ear for the management of post- operative nausea and/or vomiting:  1. The medication in the patch is effective for 72 hours, after which it should be removed.  Wrap patch in a tissue and discard in the trash. Wash hands thoroughly with soap and water. 2. You may remove the patch earlier than 72 hours if you experience unpleasant side effects which may include dry mouth, dizziness or visual disturbances. 3. Avoid touching the patch. Wash your hands with soap and water after contact with the patch.

## 2016-06-12 NOTE — Anesthesia Postprocedure Evaluation (Addendum)
Anesthesia Post Note  Patient: [name]  Procedure(s) Performed: Procedure(s) (LRB): RIGHT INDEX FINGER TRIGGER RELEASE (Right)  Patient location during evaluation: PACU Anesthesia Type: MAC Level of consciousness: sedated Pain management: pain level controlled Vital Signs Assessment: post-procedure vital signs reviewed and stable Respiratory status: spontaneous breathing and respiratory function stable Cardiovascular status: stable Anesthetic complications: no       Last Vitals:  Vitals:   06/12/16 1249 06/12/16 1256  BP:  108/60  Pulse: (P) 71   Resp: (P) 18 14  Temp:  36.4 C    Last Pain:  Vitals:   06/12/16 1000  TempSrc:   PainSc: 7                  Ahlia Lemanski DANIEL

## 2016-06-12 NOTE — Op Note (Signed)
   Date of Surgery: 06/12/2016  INDICATIONS: [name] is a 51 y.o.-year-old female with a right index trigger finger;  The patient did consent to the procedure after discussion of the risks and benefits.  PREOPERATIVE DIAGNOSIS: Right index trigger finger, tenosynovitis  POSTOPERATIVE DIAGNOSIS: Same.  PROCEDURE:  1. Right index trigger finger release 2. Right index finger FDS and FDP tenolysis  SURGEON: N. Eduard Roux, M.D.  ASSIST: none.  ANESTHESIA:  general  IV FLUIDS AND URINE: See anesthesia.  ESTIMATED BLOOD LOSS: minimal mL.  IMPLANTS: none  DRAINS: none  COMPLICATIONS: None.  DESCRIPTION OF PROCEDURE: The patient was brought to the operating room and placed supine on the operating table.  The patient had been signed prior to the procedure and this was documented. The patient had the anesthesia placed by the anesthesiologist.  A time-out was performed to confirm that this was the correct patient, site, side and location. The patient did receive antibiotics prior to the incision and was re-dosed during the procedure as needed at indicated intervals.  A tourniquet was placed.  The patient had the operative extremity prepped and draped in the standard surgical fashion.    A transverse incision across the proximal palmar crease in line with the index finger was created. Dissection was carried down to the flexor tendon sheath. The neurovascular bundles were identified on either side. These were protected. The edge of the A1 pulley was then identified and sharply incised under direct visualization. Care was taken not to violate the A2 pulley. The tendon was then inspected and did show tenosynovitis. I then performed tenolysis of the FDP and FDS tendons. I then removed the tendons through a full range of motion and they exhibited no triggering. The wound was then thoroughly irrigated. 0.25% bupivacaine was placed in the wound. The skin was closed with interrupted 4-0 nylon sutures.  Sterile dressings were applied. Patient tolerated the procedure well and no immediate complications.  POSTOPERATIVE PLAN: Patient will be discharged home.  Azucena Cecil, MD [place] 12:12 PM

## 2016-06-12 NOTE — Anesthesia Preprocedure Evaluation (Addendum)
Anesthesia Evaluation  Patient identified by MRN, date of birth, ID band Patient awake    Reviewed: Allergy & Precautions, NPO status , Patient's Chart, lab work & pertinent test results  History of Anesthesia Complications (+) history of anesthetic complications  Airway Mallampati: II  TM Distance: >3 FB     Dental no notable dental hx. (+) Dental Advisory Given, Edentulous Upper, Missing   Pulmonary sleep apnea and Continuous Positive Airway Pressure Ventilation , COPD, Current Smoker,    Pulmonary exam normal        Cardiovascular hypertension, negative cardio ROS Normal cardiovascular exam     Neuro/Psych  Headaches, PSYCHIATRIC DISORDERS Depression Bipolar Disorder  Neuromuscular disease negative psych ROS   GI/Hepatic Neg liver ROS, GERD  ,  Endo/Other  negative endocrine ROSMorbid obesity  Renal/GU negative Renal ROS  negative genitourinary   Musculoskeletal negative musculoskeletal ROS (+)   Abdominal   Peds negative pediatric ROS (+)  Hematology negative hematology ROS (+)   Anesthesia Other Findings   Reproductive/Obstetrics negative OB ROS                            Anesthesia Physical Anesthesia Plan  ASA: III  Anesthesia Plan: General   Post-op Pain Management:    Induction: Intravenous  Airway Management Planned: LMA  Additional Equipment:   Intra-op Plan:   Post-operative Plan: Extubation in OR  Informed Consent: I have reviewed the patients History and Physical, chart, labs and discussed the procedure including the risks, benefits and alternatives for the proposed anesthesia with the patient or authorized representative who has indicated his/her understanding and acceptance.   Dental advisory given  Plan Discussed with: Anesthesiologist and CRNA  Anesthesia Plan Comments:        Anesthesia Quick Evaluation

## 2016-06-12 NOTE — Progress Notes (Signed)
Latr entry; Pt was on CPAP at home, said she will put CPAP  Tonight, and her sister will stay with her tonight.  DC home via WC to the door.

## 2016-06-12 NOTE — H&P (Signed)
PREOPERATIVE H&P  Chief Complaint: right index trigger finger  HPI: [name] is a 51 y.o. female who presents for surgical treatment of right index trigger finger.  She denies any changes in medical history.  Past Medical History:  Diagnosis Date  . Arthritis 04-10-11   hips, shoulders, back  . Asthma   . Bipolar disorder ([place])   . Cancer ([place]) 1993   cervical, no treatment done, went away per pt  . Cervical dysplasia or atypia 04-10-11   '93- once dx.-got pregnant-no intervention, then postpartum, no dysplasia found  . CHF (congestive heart failure) ([place])    no cardiologist 2014 dx, none now  . 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 Pathology showed Condyloma, gluteal CONDYLOMA ACUMINATUM.   [name] Kitchen COPD (chronic obstructive pulmonary disease) ([place])   . Depression   . Dyspnea    with actity - better since taking Singular  . Fibrocystic breast disease   . GERD (gastroesophageal reflux disease)   . Hyperlipidemia   . Hypertension   . Migraine headache    none recent  . Neuropathy   . Skin lesion 03/15/2011   In gluteal crease now s/p removal by Dr. [name] of General Surgery on 3/12. Path shows condyloma.     . Sleep apnea 04-10-11   uses cpap, pt does not know settings   Past Surgical History:  Procedure Laterality Date  . ANAL FISTULECTOMY  04/17/2011   Procedure: FISTULECTOMY ANAL;  Surgeon: Imogene Burn. [name], MD;  Location: [facility]S;  Service: General;  Laterality: N/A;  Excision of Condyloma Gluteal Cleft   . BACK SURGERY  04-10-11   x5-Lumbar fusion-retained hardware.(Dr. Louanne Skye)  . BREAST CYST EXCISION     bilateral breast, 3 cysts removed from each breast  . CARPAL TUNNEL RELEASE Bilateral   . Cervical biospy    . COLONOSCOPY WITH PROPOFOL N/A 07/06/2015   Procedure: COLONOSCOPY WITH PROPOFOL;  Surgeon: [name], MD;  Location: [facility];  Service: Endoscopy;  Laterality: N/A;  . DOPPLER ECHOCARDIOGRAPHY  04/06/2012   AT [place] 55-60%  . MULTIPLE TOOTH EXTRACTIONS    . NECK SURGERY  04-10-11   x3- cervical fusion with plating and screws-Dr. [name]  . TEE WITHOUT CARDIOVERSION N/A 04/06/2012   Procedure: TRANSESOPHAGEAL ECHOCARDIOGRAM (TEE);  Surgeon: [name], MD;  [facility] END[facility]COPY;  Service: Cardiovascular;  Laterality: N/A;  . TRIGGER FINGER RELEASE Right    middle finger   Social History   Social History  . Marital status: Divorced    Spouse name: N/A  . Number of children: N/A  . Years of education: N/A   Social History Main Topics  . Smoking status: Current Every Day Smoker    Packs/day: 0.50    Years: 38.00    Types: Cigarettes    Start date: 01/28/1977  . Smokeless tobacco: Never Used  . Alcohol use 0.0 oz/week     Comment: occasionally - about every 3 weeks beer  . Drug use: Yes    Types: Marijuana, "Crack" cocaine     Comment: hx marijuana usemany years ago, Crack -none since 11/2015  . Sexual activity: No   Other Topics Concern  . None   Social History Narrative  . None   Family History  Problem Relation Age of Onset  . Stroke Father   . Cancer Maternal Aunt        breast   Allergies  Allergen Reactions  . Levofloxacin  Itching  . Methadone Hcl Other (See Comments)     "blacked out" in 1990s  . Nicotine Rash and Other (See Comments)    Patch caused a rash   Prior to Admission medications   Medication Sig Start Date End Date Taking? Authorizing Provider  amLODipine (NORVASC) 10 MG tablet TAKE 1 TABLET ONCE DAILY FOR BLOOD PRESSURE. 05/06/16  Yes Vivi Barrack, MD  Aspirin-Caffeine [facility] FAST PAIN RELIEF PO) Take 2 Packages by mouth 2 (two) times daily as needed.   Yes [provider]  atorvastatin (LIPITOR) 40 MG tablet Take 1 tablet (40 mg total) by mouth daily. 11/23/15  Yes Vivi Barrack, MD  busPIRone (BUSPAR) 10 MG tablet Take 1 tablet (10 mg total) by mouth 3 (three) times daily. 05/03/16  Yes Vivi Barrack, MD  cetirizine (QC ALL DAY ALLERGY) 10 MG  tablet TAKE 1 TABLET EACH DAY. Patient taking differently: Take 10 mg by mouth daily.  11/30/15  Yes Vivi Barrack, MD  citalopram (CELEXA) 20 MG tablet Take 1 tablet (20 mg total) by mouth daily. 11/23/15  Yes Vivi Barrack, MD  Cyanocobalamin (VITAMIN B 12 PO) Take 1 tablet by mouth daily.   Yes [provider]  fluticasone (FLONASE) 50 MCG/ACT nasal spray Place 2 sprays into both nostrils 2 (two) times daily. 05/20/16  Yes Hensel, Jamal Collin, MD  gabapentin (NEURONTIN) 300 MG capsule TAKE (1) CAPSULE THREE TIMES DAILY. Patient taking differently: TAKE (1) CAPSULE BY MOUTH 3 TIMES DAILY 05/06/16  Yes Vivi Barrack, MD  GARCINIA CAMBOGIA-CHROMIUM PO Take 1 tablet by mouth daily.   Yes [provider]  hydrochlorothiazide (HYDRODIURIL) 25 MG tablet TAKE 1 TABLET EACH DAY. 05/06/16  Yes Vivi Barrack, MD  losartan (COZAAR) 100 MG tablet Take 1 tablet (100 mg total) by mouth daily. 11/23/15  Yes Vivi Barrack, MD  mometasone-formoterol [facility]) 200-5 MCG/ACT AERO Inhale 2 puffs into the lungs 2 (two) times daily. Patient taking differently: Inhale 2 puffs into the lungs 2 (two) times daily as needed for wheezing or shortness of breath.  11/23/15  Yes Vivi Barrack, MD  montelukast (SINGULAIR) 10 MG tablet Take 1 tablet (10 mg total) by mouth at bedtime. 05/22/16  Yes Vivi Barrack, MD  omeprazole (PRILOSEC) 20 MG capsule Take 1 capsule (20 mg total) by mouth daily. 11/30/15  Yes Vivi Barrack, MD  tiotropium (SPIRIVA) 18 MCG inhalation capsule Place 1 capsule (18 mcg total) into inhaler and inhale daily. Patient taking differently: Place 18 mcg into inhaler and inhale 2 (two) times daily.  04/17/15  Yes Vivi Barrack, MD  tiZANidine (ZANAFLEX) 4 MG tablet Take 1 tablet (4 mg total) by mouth every 6 (six) hours as needed for muscle spasms. 05/07/16  Yes Vivi Barrack, MD  VENTOLIN HFA 108 (90 Base) MCG/ACT inhaler USE 1-2 PUFFS INTO LUNGS EVERY 4 HOURS AS NEEDED FOR WHEEZING  OR SHORTNESS OF BREATH. 03/29/16  Yes Vivi Barrack, MD  NON FORMULARY Uses a C-PAP at bedtime    [provider]     Positive ROS: All other systems have been reviewed and were otherwise negative with the exception of those mentioned in the HPI and as above.  Physical Exam: General: Alert, no acute distress Cardiovascular: No pedal edema Respiratory: No cyanosis, no use of accessory musculature GI: abdomen soft Skin: No lesions in the area of chief complaint Neurologic: Sensation intact distally Psychiatric: Patient is competent for consent with normal  mood and affect Lymphatic: no lymphedema  MUSCULOSKELETAL: exam stable  Assessment: right index trigger finger  Plan: Plan for Procedure(s): RIGHT INDEX FINGER TRIGGER RELEASE  The risks benefits and alternatives were discussed with the patient including but not limited to the risks of nonoperative treatment, versus surgical intervention including infection, bleeding, nerve injury,  blood clots, cardiopulmonary complications, morbidity, mortality, among others, and they were willing to proceed.   Eduard Roux, MD   06/12/2016 9:36 AM

## 2016-06-13 ENCOUNTER — Encounter (HOSPITAL_COMMUNITY): Payer: Self-pay | Admitting: Orthopaedic Surgery

## 2016-06-27 ENCOUNTER — Encounter (HOSPITAL_COMMUNITY): Payer: Self-pay | Admitting: *Deleted

## 2016-06-27 ENCOUNTER — Encounter (INDEPENDENT_AMBULATORY_CARE_PROVIDER_SITE_OTHER): Payer: Self-pay | Admitting: Orthopaedic Surgery

## 2016-06-27 ENCOUNTER — Ambulatory Visit (INDEPENDENT_AMBULATORY_CARE_PROVIDER_SITE_OTHER): Payer: Medicare Other | Admitting: Orthopaedic Surgery

## 2016-06-27 ENCOUNTER — Other Ambulatory Visit (INDEPENDENT_AMBULATORY_CARE_PROVIDER_SITE_OTHER): Payer: Self-pay | Admitting: Orthopaedic Surgery

## 2016-06-27 DIAGNOSIS — M65332 Trigger finger, left middle finger: Secondary | ICD-10-CM

## 2016-06-27 DIAGNOSIS — M65321 Trigger finger, right index finger: Secondary | ICD-10-CM

## 2016-06-27 NOTE — Progress Notes (Signed)
Pt denies SOB, chest pain, and being under the care of a cardiologist. Pt denies having a stress test and cardiac cath. Pt denies having a chest x ray within the last year. Pt made aware to stop taking  Aspirin,vitamins, fish oil Garcinia Cambogia and herbal medications. Do not take any NSAIDs ie: Ibuprofen, Advil, Naproxen, BC and Goody Powder or any medication containing Aspirin. Pt verbalized understanding of all pre-op instructions.

## 2016-06-27 NOTE — Progress Notes (Signed)
Office Visit Note   Patient: [name]           Date of Birth: 05-01-1965           MRN: [id number] Visit Date: 06/27/2016              Requested by: Vivi Barrack, MD [phone], [place] [address] PCP: Vivi Barrack, MD   Assessment & Plan: Visit Diagnoses:  1. Trigger finger, right index finger   2. Trigger finger, left middle finger     Plan: The sutures were removed today. I will like her to begin hand therapy for the right hand. We'll get her scheduled for the left middle trigger fingers as possible.  Follow-Up Instructions: Return in about 2 weeks (around 07/11/2016).   Orders:  No orders of the defined types were placed in this encounter.  No orders of the defined types were placed in this encounter.     Procedures: No procedures performed   Clinical Data: No additional findings.   Subjective: Chief Complaint  Patient presents with  . Right Index Finger - Routine Post Op    Patient is 2 weeks status post right index trigger finger release and tenolysis.  She is doing well.  She just complains of some soreness. She like to have the left middle trigger finger released as soon as possible.    Review of Systems   Objective: Vital Signs: LMP 04/10/2006   Physical Exam  Ortho Exam Exam shows a well-healed surgical incision. No signs of infection. She does have a triggering left middle finger. Specialty Comments:  No specialty comments available.  Imaging: No results found.   PMFS History: Patient Active Problem List   Diagnosis Date Noted  . Trigger finger, left middle finger 05/13/2016  . Trigger finger, right index finger 05/03/2016  . Prediabetes 05/09/2015  . Healthcare maintenance 04/17/2015  . Left low back pain 09/29/2014  . Back pain 04/07/2014  . Substance abuse 11/01/2013  . Chronic diastolic CHF (congestive heart failure) ([place]) 04/07/2012  . Allergic rhinitis 03/29/2011  . GERD 01/26/2010  . MIGRAINE  HEADACHE 10/25/2009  . DYSFUNCTIONAL UTERINE BLEEDING 10/25/2009  . ALOPECIA 06/14/2009  . Hypertension 08/04/2008  . OBSTRUCTIVE SLEEP APNEA 02/03/2008  . FIBROCYSTIC BREAST DISEASE 10/28/2006  . Hyperlipidemia 03/27/2006  . OBESITY, NOS 03/27/2006  . TOBACCO DEPENDENCE 03/27/2006  . Depression with anxiety 03/27/2006  . NEUROGENIC BLADDER 03/27/2006  . Dyspnea 03/27/2006   Past Medical History:  Diagnosis Date  . Arthritis 04-10-11   hips, shoulders, back  . Asthma   . Bipolar disorder ([place])   . Cancer ([place]) 1993   cervical, no treatment done, went away per pt  . Cervical dysplasia or atypia 04-10-11   '93- once dx.-got pregnant-no intervention, then postpartum, no dysplasia found  . CHF (congestive heart failure) ([place])    no cardiologist 2014 dx, none now  . 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 Pathology showed Condyloma, gluteal CONDYLOMA ACUMINATUM.   [name] Kitchen COPD (chronic obstructive pulmonary disease) ([place])   . Depression   . Dyspnea    with actity - better since taking Singular  . Fibrocystic breast disease   . GERD (gastroesophageal reflux disease)   . Hyperlipidemia   . Hypertension   . Migraine headache    none recent  . Neuropathy   . Skin lesion 03/15/2011   In gluteal crease now s/p removal by Dr. [name] of General Surgery on  3/12. Path shows condyloma.     . Sleep apnea 04-10-11   uses cpap, pt does not know settings    Family History  Problem Relation Age of Onset  . Stroke Father   . Cancer Maternal Aunt        breast    Past Surgical History:  Procedure Laterality Date  . ANAL FISTULECTOMY  04/17/2011   Procedure: FISTULECTOMY ANAL;  Surgeon: Imogene Burn. [name], MD;  Location: [facility];  Service: General;  Laterality: N/A;  Excision of Condyloma Gluteal Cleft   . BACK SURGERY  04-10-11   x5-Lumbar fusion-retained hardware.(Dr. Louanne Skye)  . BREAST CYST EXCISION     bilateral breast, 3 cysts removed from each breast  . CARPAL TUNNEL  RELEASE Bilateral   . Cervical biospy    . COLONOSCOPY WITH PROPOFOL N/A 07/06/2015   Procedure: COLONOSCOPY WITH PROPOFOL;  Surgeon: [name], MD;  [facility] END[facility];  Service: Endoscopy;  Laterality: N/A;  . DOPPLER ECHOCARDIOGRAPHY  04/06/2012   AT [place] 55-60%  . MULTIPLE TOOTH EXTRACTIONS    . NECK SURGERY  04-10-11   x3- cervical fusion with plating and screws-Dr. [name]  . TEE WITHOUT CARDIOVERSION N/A 04/06/2012   Procedure: TRANSESOPHAGEAL ECHOCARDIOGRAM (TEE);  Surgeon: [name], MD;  Location: [facility] ENDOSCOPY;  Service: Cardiovascular;  Laterality: N/A;  . TRIGGER FINGER RELEASE Right    middle finger  . TRIGGER FINGER RELEASE Right 06/12/2016   Procedure: RIGHT INDEX FINGER TRIGGER RELEASE;  Surgeon: [name], MD;  Location: [place];  Service: Orthopedics;  Laterality: Right;   Social History   Occupational History  . Not on file.   Social History Main Topics  . Smoking status: Current Every Day Smoker    Packs/day: 0.50    Years: 38.00    Types: Cigarettes    Start date: 01/28/1977  . Smokeless tobacco: Never Used  . Alcohol use 0.0 oz/week     Comment: occasionally - about every 3 weeks beer  . Drug use: Yes    Types: Marijuana, "Crack" cocaine     Comment: hx marijuana usemany years ago, Crack -none since 11/2015  . Sexual activity: No

## 2016-06-28 ENCOUNTER — Ambulatory Visit (HOSPITAL_COMMUNITY): Payer: Medicare Other | Admitting: Critical Care Medicine

## 2016-06-28 ENCOUNTER — Encounter (HOSPITAL_COMMUNITY): Payer: Self-pay | Admitting: *Deleted

## 2016-06-28 ENCOUNTER — Other Ambulatory Visit: Payer: Self-pay | Admitting: Family Medicine

## 2016-06-28 ENCOUNTER — Encounter (HOSPITAL_COMMUNITY)
Admission: RE | Disposition: A | Discharge status: Home / Self Care | Payer: Self-pay | Source: Ambulatory Visit | Attending: Orthopaedic Surgery

## 2016-06-28 ENCOUNTER — Ambulatory Visit (HOSPITAL_COMMUNITY)
Admission: RE | Admit: 2016-06-28 | Discharge: 2016-06-28 | Disposition: A | Discharge status: Home / Self Care | Payer: Medicare Other | Source: Ambulatory Visit | Attending: Orthopaedic Surgery | Admitting: Orthopaedic Surgery

## 2016-06-28 DIAGNOSIS — I5032 Chronic diastolic (congestive) heart failure: Secondary | ICD-10-CM | POA: Diagnosis not present

## 2016-06-28 DIAGNOSIS — J449 Chronic obstructive pulmonary disease, unspecified: Secondary | ICD-10-CM | POA: Insufficient documentation

## 2016-06-28 DIAGNOSIS — F17210 Nicotine dependence, cigarettes, uncomplicated: Secondary | ICD-10-CM | POA: Diagnosis not present

## 2016-06-28 DIAGNOSIS — Z885 Allergy status to narcotic agent status: Secondary | ICD-10-CM | POA: Diagnosis not present

## 2016-06-28 DIAGNOSIS — M65342 Trigger finger, left ring finger: Secondary | ICD-10-CM | POA: Insufficient documentation

## 2016-06-28 DIAGNOSIS — M160 Bilateral primary osteoarthritis of hip: Secondary | ICD-10-CM | POA: Diagnosis not present

## 2016-06-28 DIAGNOSIS — M19012 Primary osteoarthritis, left shoulder: Secondary | ICD-10-CM | POA: Insufficient documentation

## 2016-06-28 DIAGNOSIS — I509 Heart failure, unspecified: Secondary | ICD-10-CM | POA: Insufficient documentation

## 2016-06-28 DIAGNOSIS — M67844 Other specified disorders of tendon, left hand: Secondary | ICD-10-CM | POA: Insufficient documentation

## 2016-06-28 DIAGNOSIS — G4730 Sleep apnea, unspecified: Secondary | ICD-10-CM | POA: Insufficient documentation

## 2016-06-28 DIAGNOSIS — M19011 Primary osteoarthritis, right shoulder: Secondary | ICD-10-CM | POA: Diagnosis not present

## 2016-06-28 DIAGNOSIS — Z888 Allergy status to other drugs, medicaments and biological substances status: Secondary | ICD-10-CM | POA: Diagnosis not present

## 2016-06-28 DIAGNOSIS — Z79899 Other long term (current) drug therapy: Secondary | ICD-10-CM | POA: Diagnosis not present

## 2016-06-28 DIAGNOSIS — K219 Gastro-esophageal reflux disease without esophagitis: Secondary | ICD-10-CM | POA: Insufficient documentation

## 2016-06-28 DIAGNOSIS — I110 Hypertensive heart disease with heart failure: Secondary | ICD-10-CM | POA: Insufficient documentation

## 2016-06-28 DIAGNOSIS — M65332 Trigger finger, left middle finger: Secondary | ICD-10-CM | POA: Diagnosis not present

## 2016-06-28 DIAGNOSIS — M65842 Other synovitis and tenosynovitis, left hand: Secondary | ICD-10-CM

## 2016-06-28 DIAGNOSIS — Z881 Allergy status to other antibiotic agents status: Secondary | ICD-10-CM | POA: Diagnosis not present

## 2016-06-28 DIAGNOSIS — M479 Spondylosis, unspecified: Secondary | ICD-10-CM | POA: Insufficient documentation

## 2016-06-28 DIAGNOSIS — E669 Obesity, unspecified: Secondary | ICD-10-CM | POA: Diagnosis not present

## 2016-06-28 DIAGNOSIS — F319 Bipolar disorder, unspecified: Secondary | ICD-10-CM | POA: Insufficient documentation

## 2016-06-28 DIAGNOSIS — Z981 Arthrodesis status: Secondary | ICD-10-CM | POA: Diagnosis not present

## 2016-06-28 DIAGNOSIS — Z803 Family history of malignant neoplasm of breast: Secondary | ICD-10-CM | POA: Diagnosis not present

## 2016-06-28 DIAGNOSIS — M65321 Trigger finger, right index finger: Secondary | ICD-10-CM | POA: Diagnosis not present

## 2016-06-28 DIAGNOSIS — Z823 Family history of stroke: Secondary | ICD-10-CM | POA: Diagnosis not present

## 2016-06-28 DIAGNOSIS — Z9989 Dependence on other enabling machines and devices: Secondary | ICD-10-CM | POA: Insufficient documentation

## 2016-06-28 DIAGNOSIS — Z6841 Body Mass Index (BMI) 40.0 and over, adult: Secondary | ICD-10-CM | POA: Insufficient documentation

## 2016-06-28 DIAGNOSIS — E785 Hyperlipidemia, unspecified: Secondary | ICD-10-CM | POA: Insufficient documentation

## 2016-06-28 HISTORY — PX: TRIGGER FINGER RELEASE: SHX641

## 2016-06-28 HISTORY — DX: Trigger finger, unspecified finger: M65.30

## 2016-06-28 LAB — BASIC METABOLIC PANEL
Anion gap: 13 (ref 5–15)
BUN: 13 mg/dL (ref 6–20)
CHLORIDE: 107 mmol/L (ref 101–111)
CO2: 19 mmol/L — ABNORMAL LOW (ref 22–32)
CREATININE: 1.71 mg/dL — AB (ref 0.44–1.00)
Calcium: 9.2 mg/dL (ref 8.9–10.3)
GFR calc Af Amer: 39 mL/min — ABNORMAL LOW (ref >60)
GFR, EST NON AFRICAN AMERICAN: 33 mL/min — AB (ref >60)
Glucose, Bld: 115 mg/dL — ABNORMAL HIGH (ref 65–99)
POTASSIUM: 3.3 mmol/L — AB (ref 3.5–5.1)
SODIUM: 139 mmol/L (ref 135–145)

## 2016-06-28 LAB — CBC
HCT: 46.7 % — ABNORMAL HIGH (ref 36.0–46.0)
HEMOGLOBIN: 16.6 g/dL — AB (ref 12.0–15.0)
MCH: 29.3 pg (ref 26.0–34.0)
MCHC: 35.5 g/dL (ref 30.0–36.0)
MCV: 82.5 fL (ref 78.0–100.0)
Platelets: 320 10*3/uL (ref 150–400)
RBC: 5.66 MIL/uL — AB (ref 3.87–5.11)
RDW: 13.8 % (ref 11.5–15.5)
WBC: 12.1 10*3/uL — ABNORMAL HIGH (ref 4.0–10.5)

## 2016-06-28 SURGERY — RELEASE, A1 PULLEY, FOR TRIGGER FINGER
Anesthesia: Choice | Site: Hand | Laterality: Left

## 2016-06-28 MED ORDER — BUPIVACAINE HCL (PF) 0.25 % IJ SOLN
INTRAMUSCULAR | Status: DC | PRN
  Administered 2016-06-28: 10 mL

## 2016-06-28 MED ORDER — ONDANSETRON HCL 4 MG/2ML IJ SOLN
INTRAMUSCULAR | Status: DC | PRN
  Administered 2016-06-28: 4 mg via INTRAVENOUS

## 2016-06-28 MED ORDER — FENTANYL CITRATE (PF) 100 MCG/2ML IJ SOLN
INTRAMUSCULAR | Status: DC | PRN
  Administered 2016-06-28 (×4): 25 ug via INTRAVENOUS

## 2016-06-28 MED ORDER — EPHEDRINE 5 MG/ML INJ
INTRAVENOUS | Status: AC
  Filled 2016-06-28: qty 10

## 2016-06-28 MED ORDER — 0.9 % SODIUM CHLORIDE (POUR BTL) OPTIME
TOPICAL | Status: DC | PRN
  Administered 2016-06-28: 1000 mL

## 2016-06-28 MED ORDER — LIDOCAINE 2% (20 MG/ML) 5 ML SYRINGE
INTRAMUSCULAR | Status: DC | PRN
  Administered 2016-06-28: 60 mg via INTRAVENOUS

## 2016-06-28 MED ORDER — PROMETHAZINE HCL 25 MG/ML IJ SOLN: 6.2500 mg | INTRAMUSCULAR | Status: DC | PRN

## 2016-06-28 MED ORDER — LIDOCAINE 2% (20 MG/ML) 5 ML SYRINGE
INTRAMUSCULAR | Status: AC
  Filled 2016-06-28: qty 5

## 2016-06-28 MED ORDER — FENTANYL CITRATE (PF) 250 MCG/5ML IJ SOLN
INTRAMUSCULAR | Status: AC
  Filled 2016-06-28: qty 5

## 2016-06-28 MED ORDER — ONDANSETRON HCL 4 MG/2ML IJ SOLN
INTRAMUSCULAR | Status: AC
  Filled 2016-06-28: qty 2

## 2016-06-28 MED ORDER — HYDROCODONE-ACETAMINOPHEN 7.5-325 MG PO TABS: 1.0000 | ORAL_TABLET | Freq: Four times a day (QID) | ORAL | 0 refills | Status: DC | PRN

## 2016-06-28 MED ORDER — BUPIVACAINE HCL (PF) 0.25 % IJ SOLN
INTRAMUSCULAR | Status: AC
  Filled 2016-06-28: qty 30

## 2016-06-28 MED ORDER — GLYCOPYRROLATE 0.2 MG/ML IV SOSY
PREFILLED_SYRINGE | INTRAVENOUS | Status: DC | PRN
  Administered 2016-06-28: .2 mg via INTRAVENOUS

## 2016-06-28 MED ORDER — HYDROMORPHONE HCL 1 MG/ML IJ SOLN: 0.2500 mg | INTRAMUSCULAR | Status: DC | PRN

## 2016-06-28 MED ORDER — PROPOFOL 10 MG/ML IV BOLUS
INTRAVENOUS | Status: AC
  Filled 2016-06-28: qty 20

## 2016-06-28 MED ORDER — MIDAZOLAM HCL 5 MG/5ML IJ SOLN
INTRAMUSCULAR | Status: DC | PRN
  Administered 2016-06-28: 2 mg via INTRAVENOUS

## 2016-06-28 MED ORDER — MIDAZOLAM HCL 2 MG/2ML IJ SOLN
INTRAMUSCULAR | Status: AC
  Filled 2016-06-28: qty 2

## 2016-06-28 MED ORDER — EPHEDRINE SULFATE-NACL 50-0.9 MG/10ML-% IV SOSY
PREFILLED_SYRINGE | INTRAVENOUS | Status: DC | PRN
  Administered 2016-06-28: 10 mg via INTRAVENOUS
  Administered 2016-06-28 (×2): 5 mg via INTRAVENOUS

## 2016-06-28 MED ORDER — PHENYLEPHRINE 40 MCG/ML (10ML) SYRINGE FOR IV PUSH (FOR BLOOD PRESSURE SUPPORT)
PREFILLED_SYRINGE | INTRAVENOUS | Status: DC | PRN
  Administered 2016-06-28: 40 ug via INTRAVENOUS
  Administered 2016-06-28: 80 ug via INTRAVENOUS
  Administered 2016-06-28: 40 ug via INTRAVENOUS

## 2016-06-28 MED ORDER — CEFAZOLIN SODIUM 10 G IJ SOLR
3.0000 g | INTRAMUSCULAR | Status: AC
  Administered 2016-06-28: 3 g via INTRAVENOUS
  Filled 2016-06-28 (×2): qty 3000

## 2016-06-28 MED ORDER — PROPOFOL 10 MG/ML IV BOLUS
INTRAVENOUS | Status: DC | PRN
  Administered 2016-06-28: 180 mg via INTRAVENOUS

## 2016-06-28 MED ORDER — LACTATED RINGERS IV SOLN
INTRAVENOUS | Status: DC
  Administered 2016-06-28: 11:00:00 via INTRAVENOUS

## 2016-06-28 SURGICAL SUPPLY — 47 items
BANDAGE ACE 3X5.8 VEL STRL LF (GAUZE/BANDAGES/DRESSINGS) ×1 IMPLANT
BLADE CLIPPER SURG (BLADE) IMPLANT
BNDG CMPR 9X4 STRL LF SNTH (GAUZE/BANDAGES/DRESSINGS) ×1
BNDG COHESIVE 3X5 TAN STRL LF (GAUZE/BANDAGES/DRESSINGS) ×2 IMPLANT
BNDG CONFORM 2 STRL LF (GAUZE/BANDAGES/DRESSINGS) ×1 IMPLANT
BNDG CONFORM 3 STRL LF (GAUZE/BANDAGES/DRESSINGS) ×2 IMPLANT
BNDG ESMARK 4X9 LF (GAUZE/BANDAGES/DRESSINGS) ×2 IMPLANT
CORDS BIPOLAR (ELECTRODE) ×2 IMPLANT
COVER SURGICAL LIGHT HANDLE (MISCELLANEOUS) ×2 IMPLANT
CUFF TOURNIQUET SINGLE 18IN (TOURNIQUET CUFF) ×2 IMPLANT
CUFF TOURNIQUET SINGLE 24IN (TOURNIQUET CUFF) IMPLANT
DRAPE U-SHAPE 47X51 STRL (DRAPES) ×2 IMPLANT
ELECT REM PT RETURN 9FT ADLT (ELECTROSURGICAL) ×2
ELECTRODE REM PT RTRN 9FT ADLT (ELECTROSURGICAL) IMPLANT
GAUZE SPONGE 4X4 12PLY STRL (GAUZE/BANDAGES/DRESSINGS) ×2 IMPLANT
GAUZE XEROFORM 1X8 LF (GAUZE/BANDAGES/DRESSINGS) ×2 IMPLANT
GLOVE BIO SURGEON STRL SZ7 (GLOVE) ×1 IMPLANT
GLOVE BIO SURGEON STRL SZ7.5 (GLOVE) ×1 IMPLANT
GLOVE BIOGEL PI IND STRL 8 (GLOVE) ×1 IMPLANT
GLOVE BIOGEL PI INDICATOR 8 (GLOVE)
GLOVE SKINSENSE NS SZ7.5 (GLOVE) ×2
GLOVE SKINSENSE STRL SZ7.5 (GLOVE) IMPLANT
GLOVE SURG SYN 7.5  E (GLOVE) ×2
GLOVE SURG SYN 7.5 E (GLOVE) ×2 IMPLANT
GLOVE SURG SYN 7.5 PF PI (GLOVE) IMPLANT
GOWN STRL REUS W/ TWL LRG LVL3 (GOWN DISPOSABLE) ×4 IMPLANT
GOWN STRL REUS W/ TWL XL LVL3 (GOWN DISPOSABLE) ×1 IMPLANT
GOWN STRL REUS W/TWL LRG LVL3 (GOWN DISPOSABLE) ×2
GOWN STRL REUS W/TWL XL LVL3 (GOWN DISPOSABLE) ×2
KIT BASIN OR (CUSTOM PROCEDURE TRAY) ×2 IMPLANT
KIT ROOM TURNOVER OR (KITS) ×2 IMPLANT
MANIFOLD NEPTUNE II (INSTRUMENTS) ×1 IMPLANT
NDL HYPO 25GX1X1/2 BEV (NEEDLE) IMPLANT
NEEDLE 22X1 1/2 (OR ONLY) (NEEDLE) ×1 IMPLANT
NEEDLE HYPO 25GX1X1/2 BEV (NEEDLE) IMPLANT
NS IRRIG 1000ML POUR BTL (IV SOLUTION) ×2 IMPLANT
PACK ORTHO EXTREMITY (CUSTOM PROCEDURE TRAY) ×2 IMPLANT
PAD ARMBOARD 7.5X6 YLW CONV (MISCELLANEOUS) ×4 IMPLANT
SPONGE LAP 4X18 X RAY DECT (DISPOSABLE) ×2 IMPLANT
SUCTION FRAZIER HANDLE 10FR (MISCELLANEOUS)
SUCTION TUBE FRAZIER 10FR DISP (MISCELLANEOUS) ×1 IMPLANT
SUT ETHILON 4 0 PS 2 18 (SUTURE) ×3 IMPLANT
SYR CONTROL 10ML LL (SYRINGE) ×1 IMPLANT
TOWEL OR 17X24 6PK STRL BLUE (TOWEL DISPOSABLE) ×2 IMPLANT
TOWEL OR 17X26 10 PK STRL BLUE (TOWEL DISPOSABLE) ×2 IMPLANT
TUBE CONNECTING 12X1/4 (SUCTIONS) ×2 IMPLANT
WATER STERILE IRR 1000ML POUR (IV SOLUTION) ×2 IMPLANT

## 2016-06-28 NOTE — Telephone Encounter (Signed)
Tried to call patient but there was no answer or VM. Jazmin Hartsell,CMA

## 2016-06-28 NOTE — Telephone Encounter (Signed)
Can you ask her to schedule an appointment with me soon? Thanks! -[employer]

## 2016-06-28 NOTE — H&P (Addendum)
PREOPERATIVE H&P  Chief Complaint: Left 3rd and 4th trigger finger  HPI: [name] is a 51 y.o. female who presents for surgical treatment of left 3rd and 4th trigger finger.  She has a new locked 4th trigger finger today upon presentation and would like this to be surgically release also today.   She denies any changes in medical history.  Past Medical History:  Diagnosis Date  . Arthritis 04-10-11   hips, shoulders, back  . Asthma   . Bipolar disorder ([place])   . Cancer ([place]) 1993   cervical, no treatment done, went away per pt  . Cervical dysplasia or atypia 04-10-11   '93- once dx.-got pregnant-no intervention, then postpartum, no dysplasia found  . CHF (congestive heart failure) ([place])    no cardiologist 2014 dx, none now  . 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 Pathology showed Condyloma, gluteal CONDYLOMA ACUMINATUM.   [name] Kitchen COPD (chronic obstructive pulmonary disease) ([place])   . Depression   . Dyspnea    with actity - better since taking Singular  . Fibrocystic breast disease   . GERD (gastroesophageal reflux disease)   . Hyperlipidemia   . Hypertension   . Migraine headache    none recent  . Neuropathy   . Skin lesion 03/15/2011   In gluteal crease now s/p removal by Dr. [name] of General Surgery on 3/12. Path shows condyloma.     . Sleep apnea 04-10-11   uses cpap, pt does not know settings  . Trigger finger    left third   Past Surgical History:  Procedure Laterality Date  . ANAL FISTULECTOMY  04/17/2011   Procedure: FISTULECTOMY ANAL;  Surgeon: Imogene Burn. [name], MD;  Location: [facility] ORS;  Service: General;  Laterality: N/A;  Excision of Condyloma Gluteal Cleft   . BACK SURGERY  04-10-11   x5-Lumbar fusion-retained hardware.(Dr. Louanne Skye)  . BREAST CYST EXCISION     bilateral breast, 3 cysts removed from each breast  . CARPAL TUNNEL RELEASE Bilateral   . Cervical biospy    . COLONOSCOPY WITH PROPOFOL N/A 07/06/2015   Procedure: COLONOSCOPY  WITH PROPOFOL;  Surgeon: [name], MD;  Location: [facility] ENDOSCOPY;  Service: Endoscopy;  Laterality: N/A;  . DOPPLER ECHOCARDIOGRAPHY  04/06/2012   AT [facility] 55-60%  . MULTIPLE TOOTH EXTRACTIONS    . NECK SURGERY  04-10-11   x3- cervical fusion with plating and screws-Dr. [name]  . TEE WITHOUT CARDIOVERSION N/A 04/06/2012   Procedure: TRANSESOPHAGEAL ECHOCARDIOGRAM (TEE);  Surgeon: [name], MD;  Location: [facility] ENDOSCOPY;  Service: Cardiovascular;  Laterality: N/A;  . TRIGGER FINGER RELEASE Right    middle finger  . TRIGGER FINGER RELEASE Right 06/12/2016   Procedure: RIGHT INDEX FINGER TRIGGER RELEASE;  Surgeon: [name], MD;  Location: [place];  Service: Orthopedics;  Laterality: Right;   Social History   Social History  . Marital status: Divorced    Spouse name: N/A  . Number of children: N/A  . Years of education: N/A   Social History Main Topics  . Smoking status: Current Every Day Smoker    Packs/day: 0.50    Years: 38.00    Types: Cigarettes    Start date: 01/28/1977  . Smokeless tobacco: Never Used  . Alcohol use No  . Drug use: Yes    Types: Marijuana, "Crack" cocaine     Comment: hx marijuana usemany years ago, Crack -none since 11/2015  . Sexual activity:  No   Other Topics Concern  . None   Social History Narrative  . None   Family History  Problem Relation Age of Onset  . Stroke Father   . Cancer Maternal Aunt        breast   Allergies  Allergen Reactions  . Methadone Hcl Other (See Comments)     "blacked out" in 1990s  . Levofloxacin Itching  . Nicotine Rash and Other (See Comments)    Patch caused a rash   Prior to Admission medications   Medication Sig Start Date End Date Taking? Authorizing Provider  amLODipine (NORVASC) 10 MG tablet TAKE 1 TABLET ONCE DAILY FOR BLOOD PRESSURE. 05/06/16  Yes Vivi Barrack, MD  Aspirin-Caffeine [facility] FAST PAIN RELIEF PO) Take 2 Packages by mouth 2 (two) times daily as needed.   Yes [provider]  atorvastatin (LIPITOR) 40 MG tablet Take 1 tablet (40 mg total) by mouth daily. 11/23/15  Yes Vivi Barrack, MD  busPIRone (BUSPAR) 10 MG tablet Take 1 tablet (10 mg total) by mouth 3 (three) times daily. 05/03/16  Yes Vivi Barrack, MD  cetirizine (QC ALL DAY ALLERGY) 10 MG tablet TAKE 1 TABLET EACH DAY. Patient taking differently: Take 10 mg by mouth daily.  11/30/15  Yes Vivi Barrack, MD  citalopram (CELEXA) 20 MG tablet Take 1 tablet (20 mg total) by mouth daily. 11/23/15  Yes Vivi Barrack, MD  Cyanocobalamin (VITAMIN B 12 PO) Take 1 tablet by mouth daily.   Yes [provider]  fluticasone (FLONASE) 50 MCG/ACT nasal spray Place 2 sprays into both nostrils 2 (two) times daily. 05/20/16  Yes Hensel, Jamal Collin, MD  gabapentin (NEURONTIN) 300 MG capsule TAKE (1) CAPSULE THREE TIMES DAILY. Patient taking differently: TAKE (1) CAPSULE BY MOUTH 3 TIMES DAILY 05/06/16  Yes Vivi Barrack, MD  GARCINIA CAMBOGIA-CHROMIUM PO Take 1 tablet by mouth daily.   Yes [provider]  hydrochlorothiazide (HYDRODIURIL) 25 MG tablet TAKE 1 TABLET EACH DAY. 05/06/16  Yes Vivi Barrack, MD  HYDROcodone-acetaminophen (NORCO) 5-325 MG tablet Take 1-2 tablets by mouth every 6 (six) hours as needed. 06/12/16  Yes [name], MD  losartan (COZAAR) 100 MG tablet Take 1 tablet (100 mg total) by mouth daily. 11/23/15  Yes Vivi Barrack, MD  mometasone-formoterol [facility]) 200-5 MCG/ACT AERO Inhale 2 puffs into the lungs 2 (two) times daily. Patient taking differently: Inhale 2 puffs into the lungs 2 (two) times daily as needed for wheezing or shortness of breath.  11/23/15  Yes Vivi Barrack, MD  montelukast (SINGULAIR) 10 MG tablet Take 1 tablet (10 mg total) by mouth at bedtime. 05/22/16  Yes Vivi Barrack, MD  omeprazole (PRILOSEC) 20 MG capsule Take 1 capsule (20 mg total) by mouth daily. 11/30/15  Yes Vivi Barrack, MD  tiotropium (SPIRIVA) 18 MCG inhalation capsule Place 1  capsule (18 mcg total) into inhaler and inhale daily. Patient taking differently: Place 18 mcg into inhaler and inhale 2 (two) times daily.  04/17/15  Yes Vivi Barrack, MD  tiZANidine (ZANAFLEX) 4 MG tablet Take 1 tablet (4 mg total) by mouth every 6 (six) hours as needed for muscle spasms. 05/07/16  Yes Vivi Barrack, MD  VENTOLIN HFA 108 (90 Base) MCG/ACT inhaler USE 1-2 PUFFS INTO LUNGS EVERY 4 HOURS AS NEEDED FOR WHEEZING OR SHORTNESS OF BREATH. 03/29/16  Yes Vivi Barrack, MD  NON FORMULARY Uses a C-PAP at bedtime  [provider]     Positive ROS: All other systems have been reviewed and were otherwise negative with the exception of those mentioned in the HPI and as above.  Physical Exam: General: Alert, no acute distress Cardiovascular: No pedal edema Respiratory: No cyanosis, no use of accessory musculature GI: abdomen soft Skin: No lesions in the area of chief complaint Neurologic: Sensation intact distally Psychiatric: Patient is competent for consent with normal mood and affect Lymphatic: no lymphedema  MUSCULOSKELETAL: exam stable  Assessment: left 3rd trigger finger  Plan: Plan for Procedure(s): RELEASE TRIGGER FINGER LEFT 3RD AND 4TH FINGER  The risks benefits and alternatives were discussed with the patient including but not limited to the risks of nonoperative treatment, versus surgical intervention including infection, bleeding, nerve injury,  blood clots, cardiopulmonary complications, morbidity, mortality, among others, and they were willing to proceed.   Eduard Roux, MD   06/28/2016 12:02 PM

## 2016-06-28 NOTE — Anesthesia Procedure Notes (Signed)
Procedure Name: LMA Insertion Date/Time: 06/28/2016 12:39 PM Performed by: Merrilyn Puma B Pre-anesthesia Checklist: Patient identified, Emergency Drugs available, Suction available, Patient being monitored and Timeout performed Patient Re-evaluated:Patient Re-evaluated prior to inductionOxygen Delivery Method: Circle system utilized Preoxygenation: Pre-oxygenation with 100% oxygen Intubation Type: IV induction Ventilation: Mask ventilation without difficulty LMA: LMA inserted LMA Size: 5.0 Number of attempts: 1 Placement Confirmation: positive ETCO2 and breath sounds checked- equal and bilateral Tube secured with: Tape Dental Injury: Teeth and Oropharynx as per pre-operative assessment

## 2016-06-28 NOTE — Anesthesia Postprocedure Evaluation (Signed)
Anesthesia Post Note  Patient: [name]  Procedure(s) Performed: Procedure(s) (LRB): RELEASE TRIGGER FINGER LEFT 3RD FINGER (Left)     Patient location during evaluation: PACU Anesthesia Type: General Level of consciousness: awake and oriented Pain management: pain level controlled Vital Signs Assessment: post-procedure vital signs reviewed and stable Respiratory status: spontaneous breathing, nonlabored ventilation, respiratory function stable and patient connected to nasal cannula oxygen Cardiovascular status: blood pressure returned to baseline and stable Postop Assessment: no signs of nausea or vomiting Anesthetic complications: no    Last Vitals:  Vitals:   06/28/16 1356 06/28/16 1412  BP:  133/86  Pulse: (!) 109 (!) 106  Resp: 18 16  Temp: 36.1 C     Last Pain:  Vitals:   06/28/16 1412  TempSrc:   PainSc: 0-No pain                 Zamaya Rapaport,JAMES TERRILL

## 2016-06-28 NOTE — Op Note (Signed)
   Date of Surgery: 06/28/2016  INDICATIONS: Ms. Saling is a 51 y.o.-year-old female with a left trigger third and fourth digits;  The patient did consent to the procedure after discussion of the risks and benefits.  PREOPERATIVE DIAGNOSIS:  1. Left third and fourth trigger digits 2. Left third and fourth flexor tendon adhesions  POSTOPERATIVE DIAGNOSIS: Same.  PROCEDURE:  1. Left third digit trigger finger release 2. Left fourth digit trigger finger release 3. Left third digit FDS and FDP tenolysis 4. Left fourth digit FDS and FDP tenolysis  SURGEON: N. Eduard Roux, M.D.  ASSIST: none.  ANESTHESIA:  general  IV FLUIDS AND URINE: See anesthesia.  ESTIMATED BLOOD LOSS: minimal mL.  IMPLANTS: none  DRAINS: none  COMPLICATIONS: None.  DESCRIPTION OF PROCEDURE: The patient was brought to the operating room and placed supine on the operating table.  The patient had been signed prior to the procedure and this was documented. The patient had the anesthesia placed by the anesthesiologist.  A time-out was performed to confirm that this was the correct patient, site, side and location. The patient did receive antibiotics prior to the incision and was re-dosed during the procedure as needed at indicated intervals.  A tourniquet was placed.  The patient had the operative extremity prepped and draped in the standard surgical fashion.    A transverse incision across the distal palmar crease in line with the third and fourth digits was created. Careful blunt dissection was carried down to the flexor tendon sheath. The neurovascular bundles were identified and protected. I carefully release the A1 pulleys of both the third and fourth digits under direct visualization. The palmar pulleys were also released. She did exhibit adhesions of both the FDS and FDP tendons of both digits. I performed a tenolysis of those tendons.  The tourniquet was then deflated. Hemostasis was obtained. The wound was  thoroughly irrigated. Skin was closed with interrupted 4-0 nylon sutures. Soft sterile dressing was applied. Patient tolerated procedure well had no immediate competitions.  POSTOPERATIVE PLAN: Patient will be discharged home.  Azucena Cecil, MD [place] 1:19 PM

## 2016-06-28 NOTE — Anesthesia Preprocedure Evaluation (Addendum)
Anesthesia Evaluation  Patient identified by MRN, date of birth, ID band Patient awake    Reviewed: Allergy & Precautions, NPO status , Patient's Chart, lab work & pertinent test results  History of Anesthesia Complications Negative for: history of anesthetic complications  Airway Mallampati: II   Neck ROM: Full    Dental  (+) Edentulous Upper   Pulmonary shortness of breath, asthma , sleep apnea , COPD, Current Smoker,    breath sounds clear to auscultation       Cardiovascular hypertension, +CHF   Rhythm:Regular Rate:Normal     Neuro/Psych    GI/Hepatic GERD  ,  Endo/Other    Renal/GU      Musculoskeletal  (+) Arthritis ,   Abdominal (+) + obese,   Peds  Hematology   Anesthesia Other Findings   Reproductive/Obstetrics                           Anesthesia Physical Anesthesia Plan  ASA: III  Anesthesia Plan: General   Post-op Pain Management:    Induction: Intravenous  Airway Management Planned: LMA  Additional Equipment:   Intra-op Plan:   Post-operative Plan: Extubation in OR  Informed Consent: I have reviewed the patients History and Physical, chart, labs and discussed the procedure including the risks, benefits and alternatives for the proposed anesthesia with the patient or authorized representative who has indicated his/her understanding and acceptance.   Dental advisory given  Plan Discussed with: CRNA  Anesthesia Plan Comments:         Anesthesia Quick Evaluation

## 2016-06-28 NOTE — Transfer of Care (Signed)
Immediate Anesthesia Transfer of Care Note  Patient: [name]  Procedure(s) Performed: Procedure(s): RELEASE TRIGGER FINGER LEFT 3RD FINGER (Left)  Patient Location: PACU  Anesthesia Type:General  Level of Consciousness: awake, alert  and oriented  Airway & Oxygen Therapy: Patient Spontanous Breathing and Patient connected to nasal cannula oxygen  Post-op Assessment: Report given to RN, Post -op Vital signs reviewed and stable and Patient moving all extremities X 4  Post vital signs: Reviewed and stable  Last Vitals:  Vitals:   06/28/16 1042  BP: 132/77  Pulse: (!) 104  Resp: 20  Temp: (!) 38.1 C   HR 100, RR 18, Sats 97%, BP 99/80  Last Pain:  Vitals:   06/28/16 1042  TempSrc: Oral      Patients Stated Pain Goal: 4 (50/27/74 1287)  Complications: No apparent anesthesia complications

## 2016-06-28 NOTE — Discharge Instructions (Signed)
Postoperative instructions:  Weightbearing instructions: as tolerated.  No heavy lifting  Keep your dressing and/or splint clean and dry at all times.  You can remove your dressing on post-operative day #3 and change with a dry/sterile dressing or Band-Aids as needed thereafter.    Incision instructions:  Do not soak your incision for 3 weeks after surgery.  If the incision gets wet, pat dry and do not scrub the incision.  Pain control:  You have been given a prescription to be taken as directed for post-operative pain control.  In addition, elevate the operative extremity above the heart at all times to prevent swelling and throbbing pain.  Take over-the-counter Colace, 100mg  by mouth twice a day while taking narcotic pain medications to help prevent constipation.  Follow up appointments: 1) 10-14 days for suture removal and wound check. 2) Dr. Erlinda Hong as scheduled.   -------------------------------------------------------------------------------------------------------------  After Surgery Pain Control:  After your surgery, post-surgical discomfort or pain is likely. This discomfort can last several days to a few weeks. At certain times of the day your discomfort may be more intense.  Did you receive a nerve block?  A nerve block can provide pain relief for one hour to two days after your surgery. As long as the nerve block is working, you will experience little or no sensation in the area the surgeon operated on.  As the nerve block wears off, you will begin to experience pain or discomfort. It is very important that you begin taking your prescribed pain medication before the nerve block fully wears off. Treating your pain at the first sign of the block wearing off will ensure your pain is better controlled and more tolerable when full-sensation returns. Do not wait until the pain is intolerable, as the medicine will be less effective. It is better to treat pain in advance than to try and catch  up.  General Anesthesia:  If you did not receive a nerve block during your surgery, you will need to start taking your pain medication shortly after your surgery and should continue to do so as prescribed by your surgeon.  Pain Medication:  Most commonly we prescribe Vicodin and Percocet for post-operative pain. Both of these medications contain a combination of acetaminophen (Tylenol) and a narcotic to help control pain.   It takes between 30 and 45 minutes before pain medication starts to work. It is important to take your medication before your pain level gets too intense.   Nausea is a common side effect of many pain medications. You will want to eat something before taking your pain medicine to help prevent nausea.   If you are taking a prescription pain medication that contains acetaminophen, we recommend that you do not take additional over the counter acetaminophen (Tylenol).  Other pain relieving options:   Using a cold pack to ice the affected area a few times a day (15 to 20 minutes at a time) can help to relieve pain, reduce swelling and bruising.   Elevation of the affected area can also help to reduce pain and swelling.

## 2016-06-28 NOTE — Telephone Encounter (Signed)
Tried calling patient again and there is no VM. Jazmin Hartsell,CMA

## 2016-06-29 ENCOUNTER — Encounter (HOSPITAL_COMMUNITY): Payer: Self-pay | Admitting: Orthopaedic Surgery

## 2016-06-29 NOTE — Addendum Note (Signed)
Addendum  created 06/29/16 1012 by Duane Boston, MD   Sign clinical note

## 2016-07-01 NOTE — Telephone Encounter (Signed)
Patient scheduled for Friday with Dr. Jerline Pain. Lurline Caver,CMA

## 2016-07-05 ENCOUNTER — Ambulatory Visit: Payer: Medicare Other | Admitting: Family Medicine

## 2016-07-15 ENCOUNTER — Encounter (INDEPENDENT_AMBULATORY_CARE_PROVIDER_SITE_OTHER): Payer: Self-pay | Admitting: Orthopaedic Surgery

## 2016-07-15 ENCOUNTER — Ambulatory Visit (INDEPENDENT_AMBULATORY_CARE_PROVIDER_SITE_OTHER): Payer: Medicare Other | Admitting: Orthopaedic Surgery

## 2016-07-15 ENCOUNTER — Ambulatory Visit: Payer: Medicare Other | Admitting: Family Medicine

## 2016-07-15 DIAGNOSIS — M65322 Trigger finger, left index finger: Secondary | ICD-10-CM | POA: Insufficient documentation

## 2016-07-15 DIAGNOSIS — M65321 Trigger finger, right index finger: Secondary | ICD-10-CM

## 2016-07-15 DIAGNOSIS — M65332 Trigger finger, left middle finger: Secondary | ICD-10-CM

## 2016-07-15 MED ORDER — HYDROCODONE-ACETAMINOPHEN 5-325 MG PO TABS: 1.0000 | ORAL_TABLET | Freq: Every day | ORAL | 0 refills | Status: DC | PRN

## 2016-07-15 NOTE — Progress Notes (Signed)
Patient is 2 weeks status post left index and middle trigger finger releases. She is overall doing well. She complains of some soreness in her hand with closing her fist. Her incision is fully healed without signs of infection. The sutures were removed. Continue with hand therapy. Follow-up in 4 weeks for recheck.

## 2016-07-22 ENCOUNTER — Ambulatory Visit: Payer: Medicare Other | Admitting: Family Medicine

## 2016-08-12 ENCOUNTER — Ambulatory Visit (INDEPENDENT_AMBULATORY_CARE_PROVIDER_SITE_OTHER): Payer: Medicare Other | Admitting: Orthopaedic Surgery

## 2016-09-23 ENCOUNTER — Telehealth: Payer: Self-pay | Admitting: *Deleted

## 2016-09-23 ENCOUNTER — Emergency Department (HOSPITAL_COMMUNITY)
Admission: EM | Admit: 2016-09-23 | Discharge: 2016-09-23 | Disposition: A | Discharge status: Home / Self Care | Payer: Medicare Other | Attending: Emergency Medicine | Admitting: Emergency Medicine

## 2016-09-23 ENCOUNTER — Encounter (HOSPITAL_COMMUNITY): Payer: Self-pay | Admitting: Emergency Medicine

## 2016-09-23 ENCOUNTER — Emergency Department (HOSPITAL_COMMUNITY): Payer: Medicare Other

## 2016-09-23 DIAGNOSIS — J45909 Unspecified asthma, uncomplicated: Secondary | ICD-10-CM | POA: Diagnosis not present

## 2016-09-23 DIAGNOSIS — K5900 Constipation, unspecified: Secondary | ICD-10-CM | POA: Diagnosis not present

## 2016-09-23 DIAGNOSIS — Z79899 Other long term (current) drug therapy: Secondary | ICD-10-CM | POA: Diagnosis not present

## 2016-09-23 DIAGNOSIS — R0602 Shortness of breath: Secondary | ICD-10-CM | POA: Diagnosis not present

## 2016-09-23 DIAGNOSIS — I110 Hypertensive heart disease with heart failure: Secondary | ICD-10-CM | POA: Diagnosis not present

## 2016-09-23 DIAGNOSIS — I509 Heart failure, unspecified: Secondary | ICD-10-CM | POA: Diagnosis not present

## 2016-09-23 DIAGNOSIS — J449 Chronic obstructive pulmonary disease, unspecified: Secondary | ICD-10-CM | POA: Insufficient documentation

## 2016-09-23 DIAGNOSIS — F17210 Nicotine dependence, cigarettes, uncomplicated: Secondary | ICD-10-CM | POA: Diagnosis not present

## 2016-09-23 DIAGNOSIS — R1084 Generalized abdominal pain: Secondary | ICD-10-CM | POA: Diagnosis not present

## 2016-09-23 LAB — URINALYSIS, ROUTINE W REFLEX MICROSCOPIC
Bilirubin Urine: NEGATIVE
Glucose, UA: NEGATIVE mg/dL
Hgb urine dipstick: NEGATIVE
Ketones, ur: NEGATIVE mg/dL
Leukocytes, UA: NEGATIVE
Nitrite: NEGATIVE
Protein, ur: NEGATIVE mg/dL
Specific Gravity, Urine: 1.041 — ABNORMAL HIGH (ref 1.005–1.030)
pH: 6 (ref 5.0–8.0)

## 2016-09-23 LAB — COMPREHENSIVE METABOLIC PANEL
ALT: 25 U/L (ref 14–54)
AST: 30 U/L (ref 15–41)
Albumin: 4.1 g/dL (ref 3.5–5.0)
Alkaline Phosphatase: 84 U/L (ref 38–126)
Anion gap: 13 (ref 5–15)
BUN: 17 mg/dL (ref 6–20)
CO2: 24 mmol/L (ref 22–32)
Calcium: 9.7 mg/dL (ref 8.9–10.3)
Chloride: 104 mmol/L (ref 101–111)
Creatinine, Ser: 1.54 mg/dL — ABNORMAL HIGH (ref 0.44–1.00)
GFR calc Af Amer: 43 mL/min — ABNORMAL LOW (ref >60)
GFR calc non Af Amer: 37 mL/min — ABNORMAL LOW (ref >60)
Glucose, Bld: 120 mg/dL — ABNORMAL HIGH (ref 65–99)
Potassium: 4 mmol/L (ref 3.5–5.1)
Sodium: 141 mmol/L (ref 135–145)
Total Bilirubin: 1 mg/dL (ref 0.3–1.2)
Total Protein: 7.5 g/dL (ref 6.5–8.1)

## 2016-09-23 LAB — CBC
HCT: 49.5 % — ABNORMAL HIGH (ref 36.0–46.0)
Hemoglobin: 17.3 g/dL — ABNORMAL HIGH (ref 12.0–15.0)
MCH: 29.1 pg (ref 26.0–34.0)
MCHC: 34.9 g/dL (ref 30.0–36.0)
MCV: 83.2 fL (ref 78.0–100.0)
Platelets: 374 10*3/uL (ref 150–400)
RBC: 5.95 MIL/uL — ABNORMAL HIGH (ref 3.87–5.11)
RDW: 15.3 % (ref 11.5–15.5)
WBC: 13.7 10*3/uL — ABNORMAL HIGH (ref 4.0–10.5)

## 2016-09-23 LAB — LIPASE, BLOOD: Lipase: 35 U/L (ref 11–51)

## 2016-09-23 MED ORDER — IOPAMIDOL (ISOVUE-300) INJECTION 61%
INTRAVENOUS | Status: AC
  Administered 2016-09-23: 100 mL
  Filled 2016-09-23: qty 100

## 2016-09-23 MED ORDER — ONDANSETRON HCL 4 MG/2ML IJ SOLN
4.0000 mg | Freq: Once | INTRAMUSCULAR | Status: AC
  Administered 2016-09-23: 4 mg via INTRAVENOUS
  Filled 2016-09-23: qty 2

## 2016-09-23 MED ORDER — OXYCODONE-ACETAMINOPHEN 5-325 MG PO TABS
2.0000 | ORAL_TABLET | Freq: Once | ORAL | Status: AC
  Administered 2016-09-23: 2 via ORAL
  Filled 2016-09-23: qty 2

## 2016-09-23 MED ORDER — POLYETHYLENE GLYCOL 3350 17 G PO PACK: 17.0000 g | PACK | Freq: Every day | ORAL | 0 refills | Status: DC

## 2016-09-23 MED ORDER — HYDROMORPHONE HCL 1 MG/ML IJ SOLN
1.0000 mg | Freq: Once | INTRAMUSCULAR | Status: AC
  Administered 2016-09-23: 1 mg via INTRAVENOUS
  Filled 2016-09-23: qty 1

## 2016-09-23 NOTE — ED Notes (Signed)
ED Provider at bedside. 

## 2016-09-23 NOTE — Telephone Encounter (Signed)
Pt states that she has been having abdominal pain since 3:30 this am.  States that it "makes me double over, it hurts so bad"  Pt denies fever or blood in stool, but states it hurts worse when I press on my left side.  Advised we did not have any appts today and advised to go to urgent care of ED.   Pt agreeable.  Fleeger, Salome Spotted, CMA

## 2016-09-23 NOTE — ED Notes (Signed)
Pt wheeled to room and assisted into gown and onto bp and o2 monitor. Pt stating how much pain she is in on the way to the room and while assisting to the bed.

## 2016-09-23 NOTE — ED Notes (Signed)
When attempting to discharge pt, pt remains in a lot of pain and states she is concerned something is not right and does not feel comfortable being discharged with the amount of pain she is in at this time. This RN advised I would make the MD aware of her concerns and see if they could come speak with her again.

## 2016-09-23 NOTE — ED Notes (Signed)
Pt taken to restroom in wheelchair, pt unable to stand up straight or ambulate due to pain.

## 2016-09-23 NOTE — Discharge Instructions (Addendum)
It was our pleasure to provide your ER care today - we hope that you feel better.  Rest. Drink adequate fluids.  You were given pain medication in the ER - no driving for the next 6 hours.  Follow up with primary care doctor in the next 1-2 days if symptoms fail to improve/resolve.  Return to ER if worse, new symptoms, fevers, worsening or intractable pain, persistent vomiting, other concern.

## 2016-09-23 NOTE — ED Notes (Signed)
Patient transported to CT 

## 2016-09-23 NOTE — ED Triage Notes (Signed)
Pt to ER for evaluation of LLQ abdominal pain onset Sunday morning at 3 am, denies nausea, vomiting, and diarrhea. Denies urinary symptoms as well. Abdomen tender on palpation. States has been constipated x1 week but reports bowel movement x2 today. Pt appears very uncomfortable at triage, unable to sit still.

## 2016-09-23 NOTE — ED Notes (Signed)
Dr. Ashok Cordia made aware of patient's concerns about discharge and continued pain after dilaudid

## 2016-09-23 NOTE — ED Provider Notes (Signed)
[facility] DEPT Provider Note   CSN: [id number] Arrival date & time: 09/23/16  1059     History   Chief Complaint Chief Complaint  Patient presents with  . Abdominal Pain    HPI Mary STANGELO is a 51 y.o. female.  The history is provided by the patient. No language interpreter was used.  Abdominal Pain   This is a new problem. The current episode started yesterday. The problem occurs constantly. The problem has been gradually worsening. The pain is located in the LUQ and LLQ. The pain is severe. Associated symptoms include anorexia and fever. Nothing aggravates the symptoms. Nothing relieves the symptoms. Past workup does not include GI consult. Her past medical history is significant for PUD.   Pt reports she has been taking a supplement for weight loss.  Pt reports she is now having severe abdominal pain.   Past Medical History:  Diagnosis Date  . Arthritis 04-10-11   hips, shoulders, back  . Asthma   . Bipolar disorder ([place])   . Cancer ([place]) 1993   cervical, no treatment done, went away per pt  . Cervical dysplasia or atypia 04-10-11   '93- once dx.-got pregnant-no intervention, then postpartum, no dysplasia found  . CHF (congestive heart failure) ([place])    no cardiologist 2014 dx, none now  . 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 Pathology showed Condyloma, gluteal CONDYLOMA ACUMINATUM.   [name] Kitchen COPD (chronic obstructive pulmonary disease) ([place])   . Depression   . Dyspnea    with actity - better since taking Singular  . Fibrocystic breast disease   . GERD (gastroesophageal reflux disease)   . Hyperlipidemia   . Hypertension   . Migraine headache    none recent  . Neuropathy   . Skin lesion 03/15/2011   In gluteal crease now s/p removal by Dr. [name] of General Surgery on 3/12. Path shows condyloma.     . Sleep apnea 04-10-11   uses cpap, pt does not know settings  . Trigger finger    left third    Patient Active Problem List   Diagnosis Date Noted  . Trigger index finger of right hand 07/15/2016  . Trigger finger, left ring finger 06/28/2016  . Stenosing tenosynovitis of finger of left hand 06/28/2016  . Trigger finger, left middle finger 05/13/2016  . Trigger finger, right index finger 05/03/2016  . Prediabetes 05/09/2015  . Healthcare maintenance 04/17/2015  . Left low back pain 09/29/2014  . Back pain 04/07/2014  . Substance abuse 11/01/2013  . Chronic diastolic CHF (congestive heart failure) ([place]) 04/07/2012  . Allergic rhinitis 03/29/2011  . GERD 01/26/2010  . MIGRAINE HEADACHE 10/25/2009  . DYSFUNCTIONAL UTERINE BLEEDING 10/25/2009  . ALOPECIA 06/14/2009  . Hypertension 08/04/2008  . OBSTRUCTIVE SLEEP APNEA 02/03/2008  . FIBROCYSTIC BREAST DISEASE 10/28/2006  . Hyperlipidemia 03/27/2006  . OBESITY, NOS 03/27/2006  . TOBACCO DEPENDENCE 03/27/2006  . Depression with anxiety 03/27/2006  . NEUROGENIC BLADDER 03/27/2006  . Dyspnea 03/27/2006    Past Surgical History:  Procedure Laterality Date  . ANAL FISTULECTOMY  04/17/2011   Procedure: FISTULECTOMY ANAL;  Surgeon: Imogene Burn. [name], MD;  Location: [facility];  Service: General;  Laterality: N/A;  Excision of Condyloma Gluteal Cleft   . BACK SURGERY  04-10-11   x5-Lumbar fusion-retained hardware.(Dr. Louanne Skye)  . BREAST CYST EXCISION     bilateral breast, 3 cysts removed from each breast  . CARPAL TUNNEL RELEASE Bilateral   . Cervical biospy    .  COLONOSCOPY WITH PROPOFOL N/A 07/06/2015   Procedure: COLONOSCOPY WITH PROPOFOL;  Surgeon: [name], MD;  Location: [facility] ENDOSCOPY;  Service: Endoscopy;  Laterality: N/A;  . DOPPLER ECHOCARDIOGRAPHY  04/06/2012   AT [place] 55-60%  . MULTIPLE TOOTH EXTRACTIONS    . NECK SURGERY  04-10-11   x3- cervical fusion with plating and screws-Dr. [name]  . TEE WITHOUT CARDIOVERSION N/A 04/06/2012   Procedure: TRANSESOPHAGEAL ECHOCARDIOGRAM (TEE);  Surgeon: [name], MD;  Location[facility] ENDOSCOPY;  Service:  Cardiovascular;  Laterality: N/A;  . TRIGGER FINGER RELEASE Right    middle finger  . TRIGGER FINGER RELEASE Left 06/28/2016   Procedure: RELEASE TRIGGER FINGER LEFT 3RD FINGER;  Surgeon: [name], MD;  Location: [facility];  Service: Orthopedics;  Laterality: Left;  . TRIGGER FINGER RELEASE Right 06/12/2016   Procedure: RIGHT INDEX FINGER TRIGGER RELEASE;  Surgeon: [name], MD;  Location: [facility];  Service: Orthopedics;  Laterality: Right;    OB History    No data available       Home Medications    Prior to Admission medications   Medication Sig Start Date End Date Taking? Authorizing Provider  amLODipine (NORVASC) 10 MG tablet TAKE 1 TABLET ONCE DAILY FOR BLOOD PRESSURE. 06/28/16  Yes Vivi Barrack, MD  atorvastatin (LIPITOR) 40 MG tablet Take 1 tablet (40 mg total) by mouth daily. 11/23/15  Yes Vivi Barrack, MD  busPIRone (BUSPAR) 10 MG tablet Take 1 tablet (10 mg total) by mouth 3 (three) times daily. 05/03/16  Yes Vivi Barrack, MD  cetirizine (QC ALL DAY ALLERGY) 10 MG tablet TAKE 1 TABLET EACH DAY. Patient taking differently: Take 10 mg by mouth daily.  11/30/15  Yes Vivi Barrack, MD  citalopram (CELEXA) 20 MG tablet TAKE 1 TABLET ONCE DAILY. 06/28/16  Yes Vivi Barrack, MD  Cyanocobalamin (VITAMIN B 12 PO) Take 1 tablet by mouth daily.   Yes [provider]  fluticasone (FLONASE) 50 MCG/ACT nasal spray Place 2 sprays into both nostrils 2 (two) times daily. 05/20/16  Yes Hensel, Jamal Collin, MD  gabapentin (NEURONTIN) 300 MG capsule TAKE (1) CAPSULE THREE TIMES DAILY. 06/28/16  Yes Vivi Barrack, MD  hydrochlorothiazide (HYDRODIURIL) 25 MG tablet TAKE 1 TABLET EACH DAY. 06/28/16  Yes Vivi Barrack, MD  losartan (COZAAR) 100 MG tablet Take 1 tablet (100 mg total) by mouth daily. 11/23/15  Yes Vivi Barrack, MD  mometasone-formoterol [facility]) 200-5 MCG/ACT AERO Inhale 2 puffs into the lungs 2 (two) times daily. Patient taking differently: Inhale 2 puffs into the  lungs 2 (two) times daily as needed for wheezing or shortness of breath.  11/23/15  Yes Vivi Barrack, MD  montelukast (SINGULAIR) 10 MG tablet Take 1 tablet (10 mg total) by mouth at bedtime. 05/22/16  Yes Vivi Barrack, MD  NON FORMULARY Uses a C-PAP at bedtime   Yes [provider]  omeprazole (PRILOSEC) 20 MG capsule Take 1 capsule (20 mg total) by mouth daily. 11/30/15  Yes Vivi Barrack, MD  OVER THE COUNTER MEDICATION Take 1,500 mg by mouth 2 (two) times daily. "Lipozene" weight loss supplement   Yes [provider]  tiotropium (SPIRIVA) 18 MCG inhalation capsule Place 1 capsule (18 mcg total) into inhaler and inhale daily. Patient taking differently: Place 18 mcg into inhaler and inhale 2 (two) times daily.  04/17/15  Yes Vivi Barrack, MD  tiZANidine (ZANAFLEX) 4 MG tablet Take 1 tablet (4  mg total) by mouth every 6 (six) hours as needed for muscle spasms. 05/07/16  Yes Vivi Barrack, MD  VENTOLIN HFA 108 (90 Base) MCG/ACT inhaler USE 1-2 PUFFS INTO LUNGS EVERY 4 HOURS AS NEEDED FOR WHEEZING OR SHORTNESS OF BREATH. 03/29/16  Yes Vivi Barrack, MD  HYDROcodone-acetaminophen (NORCO) 5-325 MG tablet Take 1-2 tablets by mouth every 6 (six) hours as needed. Patient not taking: Reported on 09/23/2016 06/12/16   [name], MD  HYDROcodone-acetaminophen [facility]) 5-325 MG tablet Take 1-2 tablets by mouth daily as needed. Patient not taking: Reported on 09/23/2016 07/15/16   [name], MD  HYDROcodone-acetaminophen (NORCO) 7.5-325 MG tablet Take 1 tablet by mouth every 6 (six) hours as needed for moderate pain. Patient not taking: Reported on 09/23/2016 06/28/16   [name], MD    Family History Family History  Problem Relation Age of Onset  . Stroke Father   . Cancer Maternal Aunt        breast    Social History Social History  Substance Use Topics  . Smoking status: Current Every Day Smoker    Packs/day: 0.50    Years: 38.00    Types: Cigarettes    Start  date: 01/28/1977  . Smokeless tobacco: Never Used  . Alcohol use No     Allergies   Methadone hcl; Levofloxacin; and Nicotine   Review of Systems Review of Systems  Constitutional: Positive for fever.  Gastrointestinal: Positive for abdominal pain and anorexia.     Physical Exam Updated Vital Signs BP (!) 111/59   Pulse 80   Temp 98.7 F (37.1 C) (Oral)   Resp 20   LMP 04/10/2006   SpO2 98%   Physical Exam   ED Treatments / Results  Labs (all labs ordered are listed, but only abnormal results are displayed) Labs Reviewed  COMPREHENSIVE METABOLIC PANEL - Abnormal; Notable for the following:       Result Value   Glucose, Bld 120 (*)    Creatinine, Ser 1.54 (*)    GFR calc non Af Amer 37 (*)    GFR calc Af Amer 43 (*)    All other components within normal limits  CBC - Abnormal; Notable for the following:    WBC 13.7 (*)    RBC 5.95 (*)    Hemoglobin 17.3 (*)    HCT 49.5 (*)    All other components within normal limits  LIPASE, BLOOD  URINALYSIS, ROUTINE W REFLEX MICROSCOPIC    EKG  EKG Interpretation None       Radiology Ct Abdomen Pelvis W Contrast  Result Date: 09/23/2016 CLINICAL DATA:  Left abdominal pain since 3:30 a.m. today. Constipation. EXAM: CT ABDOMEN AND PELVIS WITH CONTRAST TECHNIQUE: Multidetector CT imaging of the abdomen and pelvis was performed using the standard protocol following bolus administration of intravenous contrast. CONTRAST:  146mL ISOVUE-300 IOPAMIDOL (ISOVUE-300) INJECTION 61% COMPARISON:  03/16/2009 and 08/22/2005. Lumbar spine radiographs dated 01/17/2003. FINDINGS: Lower chest: Minimal right basilar atelectasis. Interval 3 mm right lower lobe subpleural nodule on image number 6 of series 5. Hepatobiliary: No focal liver abnormality is seen. No gallstones, gallbladder wall thickening, or biliary dilatation. Pancreas: Unremarkable. No pancreatic ductal dilatation or surrounding inflammatory changes. Spleen: Normal in size  without focal abnormality. Adrenals/Urinary Tract: Stable mild diffuse hypertrophy of the left adrenal gland. Normal appearing right adrenal gland. The right kidney remains somewhat small with lobulated contours, similar to 03/16/2009. Normal appearing left kidney, ureters and urinary bladder. Stomach/Bowel:  Stomach is within normal limits. Appendix appears normal. No evidence of bowel wall thickening, distention, or inflammatory changes. Vascular/Lymphatic: Mild atheromatous arterial calcifications without aneurysm. No enlarged lymph nodes. Reproductive: 2.0 cm calcified uterine fibroid. Normal appearing ovaries. Other: Multiple wall lateral subcutaneous calcified injection granulomata. Midline surgical scar. Musculoskeletal: Lumbar spine laminectomy defects and fixation hardware at the L2 through L5 levels. Lumbar and lower thoracic spine degenerative changes. IMPRESSION: 1. No acute abnormality. 2. Interval 3 mm right lower lobe subpleural nodule. No follow-up needed if patient is low-risk. Non-contrast chest CT can be considered in 12 months if patient is high-risk. This recommendation follows the consensus statement: Guidelines for Management of Incidental Pulmonary Nodules Detected on CT Images: From the Fleischner Society 2017; Radiology 2017; 284:228-243. 3. 2.0 cm calcified uterine fibroid. Electronically Signed   By: Claudie Revering M.D.   On: 09/23/2016 13:50   Dg Chest Port 1 View  Result Date: 09/23/2016 CLINICAL DATA:  51 year old female with shortness of breath, concern for CHF. EXAM: PORTABLE CHEST 1 VIEW COMPARISON:  04/03/2012 FINDINGS: There is stable cardiomegaly and pulmonary vascular congestion with mild basilar edema. No focal parenchymal opacities. No sizable pleural effusions or pneumothorax. Stable surgical changes of cervical fusion again noted. No acute osseous abnormalities. IMPRESSION: Cardiomegaly with pulmonary vascular congestion and mild basilar edema. No focal parenchymal  opacities or pleural effusions. Electronically Signed   By: Kristopher Oppenheim M.D.   On: 09/23/2016 11:44    Procedures Procedures (including critical care time)  Medications Ordered in ED Medications  iopamidol (ISOVUE-300) 61 % injection (100 mLs  Contrast Given 09/23/16 1249)     Initial Impression / Assessment and Plan / ED Course  I have reviewed the triage vital signs and the nursing notes.  Pertinent labs & imaging results that were available during my care of the patient were reviewed by me and considered in my medical decision making (see chart for details).     Ct scan is normal,  Pt feels constipated.   I will have pt try miralax.  Pt advised to see her MD tomorrow for recheck  Final Clinical Impressions(s) / ED Diagnoses   Final diagnoses:  Generalized abdominal pain  Constipation, unspecified constipation type    New Prescriptions New Prescriptions   POLYETHYLENE GLYCOL (MIRALAX) PACKET    Take 17 g by mouth daily.     Fransico Meadow, [place] 09/23/16 1439    Lajean Saver, MD 09/23/16 [phone]

## 2016-09-24 ENCOUNTER — Ambulatory Visit (INDEPENDENT_AMBULATORY_CARE_PROVIDER_SITE_OTHER): Payer: Medicare Other | Admitting: Family Medicine

## 2016-09-24 ENCOUNTER — Encounter: Payer: Self-pay | Admitting: Family Medicine

## 2016-09-24 VITALS — BP 118/76 | HR 94 | Temp 98.6°F | Wt 368.0 lb

## 2016-09-24 DIAGNOSIS — R109 Unspecified abdominal pain: Secondary | ICD-10-CM | POA: Diagnosis present

## 2016-09-24 MED ORDER — TRAMADOL HCL 50 MG PO TABS: 50.0000 mg | ORAL_TABLET | Freq: Three times a day (TID) | ORAL | 0 refills | Status: DC | PRN

## 2016-09-24 NOTE — Patient Instructions (Signed)
Thank you for coming in to see [place] today. Please see below to review our plan for today's visit.  1. The source of your abdominal pain is not certain at this time though I do agree he appeared to be constipated by your history. Please take the MiraLAX prescribed by the emergency department. If you need a refill of this medication, please let us know. In addition, I would supplement with Metamucil over-the-counter. I prefer the powder form which allows you to mix with your favorite drink. You can start with half a tablespoon and increase until your stools are better formed. In the meantime, I have prescribed you a strong pain medication called tramadol. You can take this every 8 hours as needed. Please understand this is only a temporary measure.  I would like you to follow-up with Dr. Burr Medico as soon as possible so that you can establish care with her.  Please call the clinic at [phone] if your symptoms worsen or you have any concerns. It was my pleasure to see you. -- Harriet [name], [place], PGY-2

## 2016-09-24 NOTE — Progress Notes (Signed)
   Subjective:   Patient ID: [name]    DOB: [birth year], 51 y.o. female   MRN: [id number]  CC: "Abdominal pain"  HPI: [name] is a 51 y.o. female who presents to clinic today for abdominal pain. Problems discussed today are as follows:  Abdominal pain: Patient has been experiencing left-sided abdominal pain for the past 3 days. She was seen in the emergency room with a normal CT and blood work and prescribed MiraLAX which she did not take upon discharge. She has also endorsing use of an over-the-counter "diet pill" called Lipozene for the past few months. She states her bowel movements are constipated with hard stools. Bowel movements are nonbloody and occur 3 times daily. She denies history of trauma or similar pain in the past. She does not think this is related to constipation. She is requesting pain medication. Pain is unaffected by food. ROS: Denies fevers or chills, chest pain, shortness of breath, nausea or vomiting, diarrhea, melena or hematochezia.  Complete ROS performed, see HPI for pertinent.  [place]: HFpEF, HTN,, HLD,  OSA, neurogenic bladder, migraines, AUB, obesity, depression. Surgical history normal colonoscopy 2017. Family history stroke. Smoking status reviewed. Medications reviewed.  Objective:   BP 118/76   Pulse 94   Temp 98.6 F (37 C) (Oral)   Wt (!) 368 lb (166.9 kg)   LMP 04/10/2006   SpO2 94%   BMI 52.80 kg/m  Vitals and nursing note reviewed.  General: morbidly obese, well nourished, well developed, in no acute distress with non-toxic appearance CV: regular rate and rhythm without murmurs, rubs, or gallops, no lower extremity edema Lungs: clear to auscultation bilaterally with normal work of breathing Abdomen: difficult to examine due to obesity, soft, moderatly-tender with deep palpation of LUQ without guarding or rebound, non-distended, no masses or organomegaly palpable, normoactive bowel sounds Skin: warm, dry, no rashes or lesions, cap  refill < 2 seconds Extremities: warm and well perfused, normal tone  Assessment & Plan:   Acute abdominal pain Acute. No signs of acute abdomen. Reassuring CT and blood work in ED yesterday. Uncertain source though patient is endorsing feelings of constipation. Patient also appears to be fixated on her bowel movements. --Recommended use of MiraLAX as prescribed and adding Metamucil daily --Will trial with tramadol 50 mg every 8 hours as needed for pain with intent to not use being on this acute event  No orders of the defined types were placed in this encounter.  Meds ordered this encounter  Medications  . traMADol (ULTRAM) 50 MG tablet    Sig: Take 1 tablet (50 mg total) by mouth every 8 (eight) hours as needed.    Dispense:  30 tablet    Refill:  0    Harriet [name], [place], PGY-2 09/24/2016 5:37 PM

## 2016-09-24 NOTE — Assessment & Plan Note (Addendum)
Acute. No signs of acute abdomen. Reassuring CT and blood work in ED yesterday. Uncertain source though patient is endorsing feelings of constipation. Patient also appears to be fixated on her bowel movements. --Recommended use of MiraLAX as prescribed and adding Metamucil daily --Will trial with tramadol 50 mg every 8 hours as needed for pain with intent to not use being on this acute event

## 2016-10-05 ENCOUNTER — Other Ambulatory Visit: Payer: Self-pay | Admitting: Family Medicine

## 2016-10-07 ENCOUNTER — Ambulatory Visit (INDEPENDENT_AMBULATORY_CARE_PROVIDER_SITE_OTHER): Payer: Medicare Other

## 2016-10-07 ENCOUNTER — Encounter (INDEPENDENT_AMBULATORY_CARE_PROVIDER_SITE_OTHER): Payer: Self-pay | Admitting: Specialist

## 2016-10-07 ENCOUNTER — Ambulatory Visit (INDEPENDENT_AMBULATORY_CARE_PROVIDER_SITE_OTHER): Payer: Medicare Other | Admitting: Specialist

## 2016-10-07 VITALS — BP 141/86 | HR 93

## 2016-10-07 DIAGNOSIS — M545 Low back pain: Secondary | ICD-10-CM | POA: Diagnosis not present

## 2016-10-07 DIAGNOSIS — Z4889 Encounter for other specified surgical aftercare: Secondary | ICD-10-CM

## 2016-10-07 DIAGNOSIS — M4326 Fusion of spine, lumbar region: Secondary | ICD-10-CM | POA: Diagnosis not present

## 2016-10-07 DIAGNOSIS — M5442 Lumbago with sciatica, left side: Secondary | ICD-10-CM

## 2016-10-07 MED ORDER — HYDROCODONE-ACETAMINOPHEN 10-325 MG PO TABS: 1.0000 | ORAL_TABLET | ORAL | 0 refills | Status: DC | PRN

## 2016-10-07 NOTE — Progress Notes (Signed)
Office Visit Note   Patient: [name]           Date of Birth: [birth year]           MRN: [id number] Visit Date: 10/07/2016              Requested by: [name], MD [address] [place],  [address] PCP: [name], MD   Assessment & Plan: Visit Diagnoses:  1. Low back pain, unspecified back pain laterality, unspecified chronicity, with sciatica presence unspecified   2. Fusion of lumbar spine   3. Acute left-sided low back pain with left-sided sciatica   4. Encounter for other specified surgical aftercare     Plan: Avoid bending, stooping and avoid lifting weights greater than 10 lbs. Avoid prolong standing and walking. Avoid frequent bending and stooping  No lifting greater than 10 lbs. May use ice or moist heat for pain. Weight loss is of benefit. Handicap license is approved MRI of the lumbar spine ordered.  Hydrocodone for discomfort.  Follow-Up Instructions: Return in about 3 weeks (around 10/28/2016).   Orders:  Orders Placed This Encounter  Procedures  . XR Lumbar Spine 2-3 Views  . MR Lumbar Spine W Wo Contrast   Meds ordered this encounter  Medications  . HYDROcodone-acetaminophen (NORCO) 10-325 MG tablet    Sig: Take 1 tablet by mouth every 4 (four) hours as needed.    Dispense:  50 tablet    Refill:  0      Procedures: No procedures performed   Clinical Data: No additional findings.   Subjective: Chief Complaint  Patient presents with  . Lower Back - Pain    51 year old female totally disabled, underwent L3-4 and L4-5 fusions in 2000 and rearthrodesis surgery in 2007. 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  He has also undergone bilateral CTR and right long finger trigger releases and more recently has seen Dr. Erlinda Hong for left index and rightr long finger trigger releases. She presents today with worsening Back pain over the last 4 months. Seen by her primary care MD and also in the ER at [facility]. Reports just  reaching to wipe after a bowel movement and felt sudden onsent of severe pain. Pain present standing or sitting or lying. Pain radiates into both hips. She has notices swelling in her arms and legs. She is on reportedly 3 different medications. Bowel and bladder functions normally. Burning pain in the buttocks not numbness or paresthesias. Pain with bending, sitting or stooping. Pain with coughing. Difficulty standing in one place and walking with pain. Unable to grocery shop pain present with walking in from the parking lot or even to the bathroom. !-10 this is 10+. She is currently on alleve.    Review of Systems  Constitutional: Positive for activity change, appetite change and unexpected weight change.  HENT: Negative for congestion, dental problem, drooling, ear discharge, ear pain, facial swelling, hearing loss, mouth sores, nosebleeds, postnasal drip, rhinorrhea, sinus pain, sinus pressure, sneezing, sore throat, tinnitus, trouble swallowing and voice change.   Eyes: Positive for visual disturbance. Negative for photophobia, pain, discharge, redness and itching.  Respiratory: Positive for cough and wheezing. Negative for apnea, choking, chest tightness, shortness of breath and stridor.   Cardiovascular: Positive for leg swelling. Negative for chest pain and palpitations.  Gastrointestinal: Negative for abdominal distention, abdominal pain, anal bleeding, blood in stool, constipation, diarrhea, nausea, rectal pain and vomiting.  Endocrine: Positive for heat intolerance.  Negative for cold intolerance.  Genitourinary: Negative.  Negative for difficulty urinating, dyspareunia, dysuria, enuresis, flank pain, frequency, genital sores, hematuria, pelvic pain and urgency.  Musculoskeletal: Positive for back pain and gait problem. Negative for arthralgias, joint swelling, myalgias, neck pain and neck stiffness.  Skin: Negative.  Negative for color change, pallor, rash and wound.  Allergic/Immunologic:  Negative.  Negative for environmental allergies and food allergies.  Neurological: Negative for dizziness, facial asymmetry, weakness, light-headedness, numbness and headaches.  Hematological: Negative.  Negative for adenopathy. Does not bruise/bleed easily.  Psychiatric/Behavioral: Negative.  Negative for agitation, behavioral problems, confusion, decreased concentration, dysphoric mood, hallucinations, self-injury, sleep disturbance and suicidal ideas. The patient is not nervous/anxious and is not hyperactive.      Objective: Vital Signs: BP (!) 141/86 (BP Location: Left Arm, Patient Position: Sitting)   Pulse 93   LMP 04/10/2006   Physical Exam  Constitutional: She is oriented to person, place, and time. She appears well-developed and well-nourished.  HENT:  Head: Normocephalic and atraumatic.  Eyes: Pupils are equal, round, and reactive to light. EOM are normal.  Neck: Normal range of motion. Neck supple.  Pulmonary/Chest: Effort normal and breath sounds normal.  Abdominal: Soft. Bowel sounds are normal.  Neurological: She is alert and oriented to person, place, and time.  Skin: Skin is warm and dry.  Psychiatric: She has a normal mood and affect. Her behavior is normal. Judgment and thought content normal.    Back Exam   Tenderness  The patient is experiencing tenderness in the lumbar.  Range of Motion  Extension: normal  Flexion: abnormal  Lateral Bend Right: abnormal  Lateral Bend Left: abnormal  Rotation Right: abnormal  Rotation Left: abnormal   Muscle Strength  Right Quadriceps:  5/5  Left Quadriceps:  5/5  Right Hamstrings:  5/5  Left Hamstrings:  5/5   Tests  Straight leg raise right: negative Straight leg raise left: negative  Reflexes  Patellar: 0/4 Achilles: 2/4 Babinski's sign: normal   Other  Toe Walk: abnormal Heel Walk: abnormal Sensation: normal Gait: abnormal       Specialty Comments:  No specialty comments  available.  Imaging: Xr Lumbar Spine 2-3 Views  Result Date: 10/07/2016 L2 to L5 lumbar fusion with BAK cages L3-4 and L4-5, interbody fusion at L2-3 with concord cage and appears solidly healed at all levels. No motion with flexion and extension. Mild degenerative disc changes at L1-2 and T12-L1.    PMFS History: Patient Active Problem List   Diagnosis Date Noted  . Acute abdominal pain 09/24/2016  . Trigger index finger of right hand 07/15/2016  . Trigger finger, left ring finger 06/28/2016  . Stenosing tenosynovitis of finger of left hand 06/28/2016  . Trigger finger, left middle finger 05/13/2016  . Trigger finger, right index finger 05/03/2016  . Prediabetes 05/09/2015  . Healthcare maintenance 04/17/2015  . Left low back pain 09/29/2014  . Back pain 04/07/2014  . Substance abuse 11/01/2013  . Chronic diastolic CHF (congestive heart failure) ([place]) 04/07/2012  . Allergic rhinitis 03/29/2011  . GERD 01/26/2010  . MIGRAINE HEADACHE 10/25/2009  . DYSFUNCTIONAL UTERINE BLEEDING 10/25/2009  . ALOPECIA 06/14/2009  . Hypertension 08/04/2008  . OBSTRUCTIVE SLEEP APNEA 02/03/2008  . FIBROCYSTIC BREAST DISEASE 10/28/2006  . Hyperlipidemia 03/27/2006  . OBESITY, NOS 03/27/2006  . TOBACCO DEPENDENCE 03/27/2006  . Depression with anxiety 03/27/2006  . NEUROGENIC BLADDER 03/27/2006  . Dyspnea 03/27/2006   Past Medical History:  Diagnosis Date  . Arthritis 04-10-11  hips, shoulders, back  . Asthma   . Bipolar disorder ([place])   . Cancer ([place]) 1993   cervical, no treatment done, went away per pt  . Cervical dysplasia or atypia 04-10-11   '93- once dx.-got pregnant-no intervention, then postpartum, no dysplasia found  . CHF (congestive heart failure) ([place])    no cardiologist 2014 dx, none now  . 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 Pathology showed Condyloma, gluteal CONDYLOMA ACUMINATUM.   [name] Kitchen COPD (chronic obstructive pulmonary disease) ([place])   .  Depression   . Dyspnea    with actity - better since taking Singular  . Fibrocystic breast disease   . GERD (gastroesophageal reflux disease)   . Hyperlipidemia   . Hypertension   . Migraine headache    none recent  . Neuropathy   . Skin lesion 03/15/2011   In gluteal crease now s/p removal by Dr. [name] of General Surgery on 3/12. Path shows condyloma.     . Sleep apnea 04-10-11   uses cpap, pt does not know settings  . Trigger finger    left third    Family History  Problem Relation Age of Onset  . Stroke Father   . Cancer Maternal Aunt        breast    Past Surgical History:  Procedure Laterality Date  . ANAL FISTULECTOMY  04/17/2011   Procedure: FISTULECTOMY ANAL;  Surgeon: Imogene Burn. [name], MD;  Location: [facility];  Service: General;  Laterality: N/A;  Excision of Condyloma Gluteal Cleft   . BACK SURGERY  04-10-11   x5-Lumbar fusion-retained hardware.(Dr. Louanne Skye)  . BREAST CYST EXCISION     bilateral breast, 3 cysts removed from each breast  . CARPAL TUNNEL RELEASE Bilateral   . Cervical biospy    . COLONOSCOPY WITH PROPOFOL N/A 07/06/2015   Procedure: COLONOSCOPY WITH PROPOFOL;  Surgeon: [name], MD;  [facility]: [facility];  Service: Endoscopy;  Laterality: N/A;  . DOPPLER ECHOCARDIOGRAPHY  04/06/2012   AT [place] 55-60%  . MULTIPLE TOOTH EXTRACTIONS    . NECK SURGERY  04-10-11   x3- cervical fusion with plating and screws-Dr. [name]  . TEE WITHOUT CARDIOVERSION N/A 04/06/2012   Procedure: TRANSESOPHAGEAL ECHOCARDIOGRAM (TEE);  Surgeon: [name], MD;  Location: [facility] ENDOSCOPY;  Service: Cardiovascular;  Laterality: N/A;  . TRIGGER FINGER RELEASE Right    middle finger  . TRIGGER FINGER RELEASE Left 06/28/2016   Procedure: RELEASE TRIGGER FINGER LEFT 3RD FINGER;  Surgeon: [name], MD;  Location: [place];  Service: Orthopedics;  Laterality: Left;  . TRIGGER FINGER RELEASE Right 06/12/2016   Procedure: RIGHT INDEX FINGER TRIGGER RELEASE;  Surgeon: [name], MD;  Location: [place];  Service: Orthopedics;  Laterality: Right;   Social History   Occupational History  . Not on file.   Social History Main Topics  . Smoking status: Current Every Day Smoker    Packs/day: 0.50    Years: 38.00    Types: Cigarettes    Start date: 01/28/1977  . Smokeless tobacco: Never Used  . Alcohol use No  . Drug use: Yes    Types: Marijuana, "Crack" cocaine     Comment: hx marijuana usemany years ago, Crack -none since 11/2015  . Sexual activity: No

## 2016-10-07 NOTE — Patient Instructions (Signed)
Avoid bending, stooping and avoid lifting weights greater than 10 lbs. Avoid prolong standing and walking. Avoid frequent bending and stooping  No lifting greater than 10 lbs. May use ice or moist heat for pain. Weight loss is of benefit. Handicap license is approved MRI of the lumbar spine ordered.  Hydrocodone for discomfort.

## 2016-10-16 ENCOUNTER — Ambulatory Visit: Payer: Medicare Other | Admitting: Student in an Organized Health Care Education/Training Program

## 2016-10-16 NOTE — Progress Notes (Deleted)
   CC: ***  HPI: [name] is a 51 y.o. female with PMH significant for *** who presents to [facility] today with *** of *** duration.   Overdue health maintenance ***  Review of Symptoms:  See HPI for ROS.   CC, SH/smoking status, and VS noted.  Objective: LMP 04/10/2006  GEN: NAD, alert, cooperative, and pleasant.*** EYE: no conjunctival injection, pupils equally round and reactive to light ENMT: normal tympanic light reflex, no nasal polyps,no rhinorrhea, no pharyngeal erythema or exudates NECK: full ROM, no thyromegaly RESPIRATORY: clear to auscultation bilaterally with no wheezes, rhonchi or rales, good effort CV: RRR, no m/r/g, no peripheral edema GI: soft, non-tender, non-distended, no hepatosplenomegaly SKIN: warm and dry, no rashes or lesions NEURO: II-XII grossly intact, normal gait, peripheral sensation intact PSYCH: AAOx3, appropriate affect  Flu Vaccine: *** Tdap Vaccine: *** - every 72yrs - (<3 lifetime doses or unknown): all wounds -- look up need for Tetanus IG - (>=3 lifetime doses): clean/minor wound if >73yrs from previous; all other wounds if >31yrs from previous Zoster Vaccine: *** (those >50yo, once) Pneumonia Vaccine: *** (those w/ risk factors) - (<59yr) Both: Immunocompromised, cochlear implant, CSF leak, asplenic, sickle cell, Chronic Renal Failure - (<30yr) PPSV-23 only: Heart dz, lung disease, DM, tobacco abuse, alcoholism, cirrhosis/liver disease. - (>7yr): PPSV13 then PPSV23 in 6-12mths;  - (>47yr): repeat PPSV23 once if pt received prior to 51yo and 83yrs have passed  Assessment and plan:  No problem-specific Assessment & Plan notes found for this encounter.   No orders of the defined types were placed in this encounter.   No orders of the defined types were placed in this encounter.    [name], MD,MS,  PGY2 10/16/2016 6:26 AM

## 2016-10-21 ENCOUNTER — Inpatient Hospital Stay: Admission: RE | Admit: 2016-10-21 | Payer: Medicare Other | Source: Ambulatory Visit

## 2016-10-26 ENCOUNTER — Ambulatory Visit
Admission: RE | Admit: 2016-10-26 | Discharge: 2016-10-26 | Disposition: A | Discharge status: Home / Self Care | Payer: Medicare Other | Source: Ambulatory Visit | Attending: Specialist | Admitting: Specialist

## 2016-10-26 DIAGNOSIS — M48061 Spinal stenosis, lumbar region without neurogenic claudication: Secondary | ICD-10-CM | POA: Diagnosis not present

## 2016-10-26 DIAGNOSIS — Z4889 Encounter for other specified surgical aftercare: Secondary | ICD-10-CM

## 2016-10-26 MED ORDER — GADOBENATE DIMEGLUMINE 529 MG/ML IV SOLN
10.0000 mL | Freq: Once | INTRAVENOUS | Status: AC | PRN
  Administered 2016-10-26: 10 mL via INTRAVENOUS

## 2016-10-28 ENCOUNTER — Ambulatory Visit (INDEPENDENT_AMBULATORY_CARE_PROVIDER_SITE_OTHER): Payer: Medicare Other | Admitting: Specialist

## 2016-10-28 ENCOUNTER — Encounter (INDEPENDENT_AMBULATORY_CARE_PROVIDER_SITE_OTHER): Payer: Self-pay | Admitting: Specialist

## 2016-10-28 VITALS — BP 194/103 | HR 105 | Ht 70.0 in | Wt 368.0 lb

## 2016-10-28 DIAGNOSIS — M48062 Spinal stenosis, lumbar region with neurogenic claudication: Secondary | ICD-10-CM | POA: Diagnosis not present

## 2016-10-28 DIAGNOSIS — R109 Unspecified abdominal pain: Secondary | ICD-10-CM | POA: Diagnosis not present

## 2016-10-28 DIAGNOSIS — M4804 Spinal stenosis, thoracic region: Secondary | ICD-10-CM | POA: Diagnosis not present

## 2016-10-28 NOTE — Progress Notes (Signed)
Office Visit Note   Patient: [name]           Date of Birth: 12/08/1965           MRN: [id number] Visit Date: 10/28/2016              Requested by: [name], MD [address] [place], [place] [address] PCP: [name], MD   Assessment & Plan: Visit Diagnoses:  1. Spinal stenosis of lumbar region with neurogenic claudication   2. Thoracic spinal stenosis     Plan: Avoid bending, stooping and avoid lifting weights greater than 10 lbs. Avoid prolong standing and walking. Avoid frequent bending and stooping  No lifting greater than 10 lbs. May use ice or moist heat for pain. Weight loss is of benefit. Handicap license is approved. Dr. Romona Curls secretary/Assistant will call to arrange for epidural steroid injection   Follow-Up Instructions: Return in about 4 weeks (around 11/25/2016).   Orders:  Orders Placed This Encounter  Procedures  . Ambulatory referral to Physical Medicine Rehab   No orders of the defined types were placed in this encounter.     Procedures: No procedures performed   Clinical Data: No additional findings.   Subjective: Chief Complaint  Patient presents with  . Lower Back - Follow-up    MRI Review -Lumbar     HPI  Review of Systems   Objective: Vital Signs: BP (!) 194/103 (BP Location: Left Arm, Patient Position: Sitting)   Pulse (!) 105   Ht 5\' 10"  (1.778 m)   Wt (!) 368 lb (166.9 kg)   LMP 04/10/2006   BMI 52.80 kg/m   Physical Exam  Ortho Exam  Specialty Comments:  No specialty comments available.  Imaging: No results found.   PMFS History: Patient Active Problem List   Diagnosis Date Noted  . Acute abdominal pain 09/24/2016  . Trigger index finger of right hand 07/15/2016  . Trigger finger, left ring finger 06/28/2016  . Stenosing tenosynovitis of finger of left hand 06/28/2016  . Trigger finger, left middle finger 05/13/2016  . Trigger finger, right index finger 05/03/2016  . Prediabetes  05/09/2015  . Healthcare maintenance 04/17/2015  . Left low back pain 09/29/2014  . Back pain 04/07/2014  . Substance abuse ([place]) 11/01/2013  . Chronic diastolic CHF (congestive heart failure) ([place]) 04/07/2012  . Allergic rhinitis 03/29/2011  . GERD 01/26/2010  . MIGRAINE HEADACHE 10/25/2009  . DYSFUNCTIONAL UTERINE BLEEDING 10/25/2009  . ALOPECIA 06/14/2009  . Hypertension 08/04/2008  . OBSTRUCTIVE SLEEP APNEA 02/03/2008  . FIBROCYSTIC BREAST DISEASE 10/28/2006  . Hyperlipidemia 03/27/2006  . OBESITY, NOS 03/27/2006  . TOBACCO DEPENDENCE 03/27/2006  . Depression with anxiety 03/27/2006  . NEUROGENIC BLADDER 03/27/2006  . Dyspnea 03/27/2006   Past Medical History:  Diagnosis Date  . Arthritis 04-10-11   hips, shoulders, back  . Asthma   . Bipolar disorder ([place])   . Cancer ([place]) 1993   cervical, no treatment done, went away per pt  . Cervical dysplasia or atypia 04-10-11   '93- once dx.-got pregnant-no intervention, then postpartum, no dysplasia found  . CHF (congestive heart failure) ([place])    no cardiologist 2014 dx, none now  . 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 Pathology showed Condyloma, gluteal CONDYLOMA ACUMINATUM.   [name] Kitchen COPD (chronic obstructive pulmonary disease) ([place])   . Depression   . Dyspnea    with actity - better since taking Singular  . Fibrocystic breast disease   .  GERD (gastroesophageal reflux disease)   . Hyperlipidemia   . Hypertension   . Migraine headache    none recent  . Neuropathy   . Skin lesion 03/15/2011   In gluteal crease now s/p removal by Dr. [name] of General Surgery on 3/12. Path shows condyloma.     . Sleep apnea 04-10-11   uses cpap, pt does not know settings  . Trigger finger    left third    Family History  Problem Relation Age of Onset  . Stroke Father   . Cancer Maternal Aunt        breast    Past Surgical History:  Procedure Laterality Date  . ANAL FISTULECTOMY  04/17/2011   Procedure:  FISTULECTOMY ANAL;  Surgeon: Imogene Burn. [name], MD;  [facility];  Service: General;  Laterality: N/A;  Excision of Condyloma Gluteal Cleft   . BACK SURGERY  04-10-11   x5-Lumbar fusion-retained hardware.(Dr. Louanne Skye)  . BREAST CYST EXCISION     bilateral breast, 3 cysts removed from each breast  . CARPAL TUNNEL RELEASE Bilateral   . Cervical biospy    . COLONOSCOPY WITH PROPOFOL N/A 07/06/2015   Procedure: COLONOSCOPY WITH PROPOFOL;  Surgeon: [name], MD;  [facility];  Service: Endoscopy;  Laterality: N/A;  . DOPPLER ECHOCARDIOGRAPHY  04/06/2012   AT [place] 55-60%  . MULTIPLE TOOTH EXTRACTIONS    . NECK SURGERY  04-10-11   x3- cervical fusion with plating and screws-Dr. [name]  . TEE WITHOUT CARDIOVERSION N/A 04/06/2012   Procedure: TRANSESOPHAGEAL ECHOCARDIOGRAM (TEE);  Surgeon: [name], MD;  Location[facility] ENDOSCOPY;  Service: Cardiovascular;  Laterality: N/A;  . TRIGGER FINGER RELEASE Right    middle finger  . TRIGGER FINGER RELEASE Left 06/28/2016   Procedure: RELEASE TRIGGER FINGER LEFT 3RD FINGER;  Surgeon: [name], MD;  Location: [place];  Service: Orthopedics;  Laterality: Left;  . TRIGGER FINGER RELEASE Right 06/12/2016   Procedure: RIGHT INDEX FINGER TRIGGER RELEASE;  Surgeon: [name], MD;  Location: [place];  Service: Orthopedics;  Laterality: Right;   Social History   Occupational History  . Not on file.   Social History Main Topics  . Smoking status: Current Every Day Smoker    Packs/day: 0.50    Years: 38.00    Types: Cigarettes    Start date: 01/28/1977  . Smokeless tobacco: Never Used  . Alcohol use No  . Drug use: Yes    Types: Marijuana, "Crack" cocaine     Comment: hx marijuana usemany years ago, Crack -none since 11/2015  . Sexual activity: No

## 2016-10-28 NOTE — Patient Instructions (Signed)
Avoid bending, stooping and avoid lifting weights greater than 10 lbs. Avoid prolong standing and walking. Avoid frequent bending and stooping  No lifting greater than 10 lbs. May use ice or moist heat for pain. Weight loss is of benefit. Handicap license is approved. Dr. Newton's secretary/Assistant will call to arrange for epidural steroid injection  

## 2016-11-04 ENCOUNTER — Other Ambulatory Visit: Payer: Self-pay | Admitting: Student in an Organized Health Care Education/Training Program

## 2016-11-04 DIAGNOSIS — Z1231 Encounter for screening mammogram for malignant neoplasm of breast: Secondary | ICD-10-CM

## 2016-11-14 ENCOUNTER — Ambulatory Visit (INDEPENDENT_AMBULATORY_CARE_PROVIDER_SITE_OTHER): Payer: Medicare Other | Admitting: Physical Medicine and Rehabilitation

## 2016-11-14 ENCOUNTER — Encounter (INDEPENDENT_AMBULATORY_CARE_PROVIDER_SITE_OTHER): Payer: Self-pay | Admitting: Physical Medicine and Rehabilitation

## 2016-11-14 ENCOUNTER — Ambulatory Visit (INDEPENDENT_AMBULATORY_CARE_PROVIDER_SITE_OTHER): Payer: Medicare Other

## 2016-11-14 VITALS — BP 135/82 | HR 101 | Temp 98.3°F

## 2016-11-14 DIAGNOSIS — M5416 Radiculopathy, lumbar region: Secondary | ICD-10-CM | POA: Diagnosis not present

## 2016-11-14 DIAGNOSIS — M961 Postlaminectomy syndrome, not elsewhere classified: Secondary | ICD-10-CM | POA: Diagnosis not present

## 2016-11-14 MED ORDER — BETAMETHASONE SOD PHOS & ACET 6 (3-3) MG/ML IJ SUSP
12.0000 mg | Freq: Once | INTRAMUSCULAR | Status: AC
  Administered 2016-11-14: 12 mg

## 2016-11-14 MED ORDER — LIDOCAINE HCL (PF) 1 % IJ SOLN
2.0000 mL | Freq: Once | INTRAMUSCULAR | Status: AC
  Administered 2016-11-14: 2 mL

## 2016-11-14 NOTE — Patient Instructions (Signed)

## 2016-11-14 NOTE — Progress Notes (Deleted)
Lower back pain, left side pain, radiating pain to left hip. No numbness, states fever to touch. No injury. 5 back surgeries  Last injection approximately 35yrs ago, with no relief.  Constant pain. Unable to walk far.  In ER 09/23/16 due to pain, has continued to increase.  No contrast allergy. No blood thinner Has driver.   No medication helps.

## 2016-11-15 ENCOUNTER — Ambulatory Visit
Admission: RE | Admit: 2016-11-15 | Discharge: 2016-11-15 | Disposition: A | Discharge status: Home / Self Care | Payer: Medicare Other | Source: Ambulatory Visit | Attending: Family Medicine | Admitting: Family Medicine

## 2016-11-15 DIAGNOSIS — Z1231 Encounter for screening mammogram for malignant neoplasm of breast: Secondary | ICD-10-CM

## 2016-11-18 ENCOUNTER — Other Ambulatory Visit: Payer: Self-pay | Admitting: Family Medicine

## 2016-11-19 ENCOUNTER — Other Ambulatory Visit: Payer: Self-pay | Admitting: Family Medicine

## 2016-11-19 DIAGNOSIS — R928 Other abnormal and inconclusive findings on diagnostic imaging of breast: Secondary | ICD-10-CM

## 2016-11-22 ENCOUNTER — Other Ambulatory Visit: Payer: Self-pay | Admitting: Family Medicine

## 2016-11-22 ENCOUNTER — Ambulatory Visit
Admission: RE | Admit: 2016-11-22 | Discharge: 2016-11-22 | Disposition: A | Discharge status: Home / Self Care | Payer: Medicare Other | Source: Ambulatory Visit | Attending: Family Medicine | Admitting: Family Medicine

## 2016-11-22 ENCOUNTER — Ambulatory Visit (INDEPENDENT_AMBULATORY_CARE_PROVIDER_SITE_OTHER): Payer: Medicare Other | Admitting: Student in an Organized Health Care Education/Training Program

## 2016-11-22 ENCOUNTER — Encounter: Payer: Self-pay | Admitting: Student in an Organized Health Care Education/Training Program

## 2016-11-22 ENCOUNTER — Ambulatory Visit: Payer: Medicare Other

## 2016-11-22 VITALS — BP 127/80 | HR 95 | Temp 97.7°F | Ht 70.0 in | Wt 373.0 lb

## 2016-11-22 DIAGNOSIS — Z23 Encounter for immunization: Secondary | ICD-10-CM

## 2016-11-22 DIAGNOSIS — Z712 Person consulting for explanation of examination or test findings: Secondary | ICD-10-CM

## 2016-11-22 DIAGNOSIS — R928 Other abnormal and inconclusive findings on diagnostic imaging of breast: Secondary | ICD-10-CM | POA: Diagnosis not present

## 2016-11-22 DIAGNOSIS — Z0000 Encounter for general adult medical examination without abnormal findings: Secondary | ICD-10-CM | POA: Diagnosis not present

## 2016-11-22 NOTE — Progress Notes (Signed)
   CC: Meet new PCP  HPI: [name] is a 51 y.o. female with PMH significant for substance abuse, CHF, GERD, alopecia who presents to [facility] today to meet PCP and get mammogram results.  Patient asks about mammogram results which were reada and show no mammographic evidence of malignancy. Follow up mammogram is recommended in one year.  Patient also states she wanted to come in and meet her new PCP which was the reason she scheduled this visit.   Review of Symptoms:  See HPI for ROS.   CC, SH/smoking status, and VS noted.  Objective: BP 127/80 (BP Location: Left Wrist, Patient Position: Sitting, Cuff Size: Normal)   Pulse 95   Temp 97.7 F (36.5 C) (Oral)   Ht 5\' 10"  (1.778 m)   Wt (!) 373 lb (169.2 kg)   LMP 04/10/2006   SpO2 98%   BMI 53.52 kg/m  GEN: NAD, alert, cooperative, and pleasant. RESPIRATORY: clear to auscultation bilaterally with no wheezes, rhonchi or rales, good effort CV: RRR, no m/r/g GI: soft, non-tender, non-distended, no hepatosplenomegaly SKIN: warm and dry, no rashes or lesions PSYCH: AAOx3, appropriate affect  Assessment and plan:  Encounter to discuss test results Mammogram results provided. No evidence for malignancy. Patient to have follow up test in one year. She voices understanding and appreciation of this information.  Healthcare maintenance Flu shot provided    Orders Placed This Encounter  Procedures  . Flu Vaccine QUAD 36+ mos IM    No orders of the defined types were placed in this encounter.    [name], MD,MS,  PGY2 11/25/2016 11:10 AM

## 2016-11-22 NOTE — Patient Instructions (Signed)
It was a pleasure seeing you today in our clinic. Today we discussed your mammogram results. You received your flu shots.  Our clinic's number is [phone]. Please call with questions or concerns about what we discussed today.  Be well, Dr. Burr Medico

## 2016-11-25 DIAGNOSIS — Z23 Encounter for immunization: Secondary | ICD-10-CM | POA: Insufficient documentation

## 2016-11-25 DIAGNOSIS — Z712 Person consulting for explanation of examination or test findings: Secondary | ICD-10-CM | POA: Insufficient documentation

## 2016-11-25 NOTE — Assessment & Plan Note (Signed)
Mammogram results provided. No evidence for malignancy. Patient to have follow up test in one year. She voices understanding and appreciation of this information.

## 2016-11-25 NOTE — Assessment & Plan Note (Signed)
Flu shot provided. 

## 2016-11-26 NOTE — Procedures (Signed)
[name] is a 51 year old female with prior lumbar fusion from L3 to the sacrum.  Dr. Louanne Skye request diagnostic and hopefully therapeutic left L1-2 or L2-3 epidural injection.  I reviewed the MRI the patient's fusion is at L3-4.  Counting down from the last rib-bearing structure we did complete the injection at the left L2-3 position.  She does have moderate narrowing here.  Lumbar Epidural Steroid Injection - Interlaminar Approach with Fluoroscopic Guidance  Patient: [name]      Date of Birth: Nov 04, 1965 MRN: [id number] PCP: [name], MD      Visit Date: 11/14/2016   Universal Protocol:     Consent Given By: the patient  Position: PRONE  Additional Comments: Vital signs were monitored before and after the procedure. Patient was prepped and draped in the usual sterile fashion. The correct patient, procedure, and site was verified.   Injection Procedure Details:  Procedure Site One Meds Administered:  Meds ordered this encounter  Medications  . lidocaine (PF) (XYLOCAINE) 1 % injection 2 mL  . betamethasone acetate-betamethasone sodium phosphate (CELESTONE) injection 12 mg     Laterality: Left  Location/Site:  L2-L3  Needle size: 20 G  Needle type: Tuohy  Needle Placement: Paramedian epidural  Findings:  -Contrast Used: 1 mL iohexol 180 mg iodine/mL   -Comments: Excellent flow of contrast into the epidural space.  Procedure Details: Using a paramedian approach from the side mentioned above, the region overlying the inferior lamina was localized under fluoroscopic visualization and the soft tissues overlying this structure were infiltrated with 4 ml. of 1% Lidocaine without Epinephrine. The Tuohy needle was inserted into the epidural space using a paramedian approach.   The epidural space was localized using loss of resistance along with lateral and bi-planar fluoroscopic views.  After negative aspirate for air, blood, and CSF, a 2 ml. volume of Isovue-250  was injected into the epidural space and the flow of contrast was observed. Radiographs were obtained for documentation purposes.    The injectate was administered into the level noted above.   Additional Comments:  The patient tolerated the procedure well Dressing: Band-Aid    Post-procedure details: Patient was observed during the procedure. Post-procedure instructions were reviewed.  Patient left the clinic in stable condition.

## 2016-11-29 ENCOUNTER — Encounter (INDEPENDENT_AMBULATORY_CARE_PROVIDER_SITE_OTHER): Payer: Self-pay | Admitting: Specialist

## 2016-11-29 ENCOUNTER — Other Ambulatory Visit: Payer: Self-pay | Admitting: Family Medicine

## 2016-11-29 ENCOUNTER — Ambulatory Visit (INDEPENDENT_AMBULATORY_CARE_PROVIDER_SITE_OTHER): Payer: Medicare Other | Admitting: Specialist

## 2016-11-29 VITALS — BP 130/87 | HR 90 | Ht 70.0 in | Wt 368.0 lb

## 2016-11-29 DIAGNOSIS — M48062 Spinal stenosis, lumbar region with neurogenic claudication: Secondary | ICD-10-CM | POA: Diagnosis not present

## 2016-11-29 DIAGNOSIS — Z981 Arthrodesis status: Secondary | ICD-10-CM

## 2016-11-29 MED ORDER — ACETAMINOPHEN-CODEINE #4 300-60 MG PO TABS
1.0000 | ORAL_TABLET | Freq: Four times a day (QID) | ORAL | 0 refills | Status: DC | PRN
Start: 2016-11-29 — End: 2016-12-05

## 2016-11-29 NOTE — Patient Instructions (Signed)
Avoid bending, stooping and avoid lifting weights greater than 10 lbs. Avoid prolong standing and walking. Avoid frequent bending and stooping  No lifting greater than 10 lbs. May use ice or moist heat for pain. Weight loss is of benefit. Handicap license is approved. See a nutritional specialist and get your weight down, not just for your back but for your general health, well being and longevity. Pain management referral, presently, your complaints of pain seem to be related to mild to moderate changes of spinal narrowing in your back but your size, history of CHF and COPD make surgery very risky. See your primary care MD to be referred for bariatric surgery evaluation.  Tylenol #3 for discomfort up to 3-4 times a day. Flexion exercises of the lumbar spine.

## 2016-11-29 NOTE — Progress Notes (Signed)
[place]

## 2016-11-29 NOTE — Progress Notes (Signed)
Office Visit Note   Patient: [name]           Date of Birth: [birth year]           MRN: [id number] Visit Date: 11/29/2016              Requested by: [name], MD [address] [place], [place] [address] PCP: [name], MD   Assessment & Plan: Visit Diagnoses:  1. Spinal stenosis of lumbar region with neurogenic claudication   2. History of lumbar spinal fusion   3. Morbid (severe) obesity due to excess calories ([place])     Plan: Avoid bending, stooping and avoid lifting weights greater than 10 lbs. Avoid prolong standing and walking. Avoid frequent bending and stooping  No lifting greater than 10 lbs. May use ice or moist heat for pain. Weight loss is of benefit. Handicap license is approved. See a nutritional specialist and get your weight down, not just for your back but for your general health, well being and longevity. Pain management referral, presently, your complaints of pain seem to be related to mild to moderate changes of spinal narrowing in your back but your size, history of CHF and COPD make surgery very risky. See your primary care MD to be referred for bariatric surgery evaluation.  Tylenol #3 for discomfort up to 3-4 times a day. Flexion exercises of the lumbar spine.   Follow-Up Instructions: Return in about 4 weeks (around 12/27/2016).   Orders:  No orders of the defined types were placed in this encounter.  Meds ordered this encounter  Medications  . acetaminophen-codeine (TYLENOL #4) 300-60 MG tablet    Sig: Take 1 tablet by mouth every 6 (six) hours as needed for moderate pain.    Dispense:  40 tablet    Refill:  0      Procedures: No procedures performed   Clinical Data: No additional findings.   Subjective: Chief Complaint  Patient presents with  . Lower Back - Follow-up    Had Left L2-3 IL with Dr. Ernestina Patches on 11/14/2016    51 year old female with persistent low back pain central and at the lumbosacral junction. She  describes local febrile illness. She reports that she needs something for pain. She is unable to work, says she just wants to be able to function. ESIs done on 11/14/2016 she reports did not help. Has urgency, and accidents when she has to pass her water her nearly goes immediately. Can only lie on the right side to get relief. The left side bothers her the most, night pain difficulty sleeping with cramping and charlie horses in the calf left greater than right. She reports her back is burning as we speak.     Review of Systems  Constitutional: Negative for activity change, appetite change, diaphoresis, fatigue, fever and unexpected weight change.  HENT: Negative for congestion, drooling, ear discharge, facial swelling, postnasal drip, rhinorrhea, sinus pain, sinus pressure, sneezing, sore throat, tinnitus and voice change.   Eyes: Negative.  Negative for photophobia, pain, redness, itching and visual disturbance.  Respiratory: Positive for apnea, shortness of breath and wheezing.   Cardiovascular: Negative.  Negative for chest pain, palpitations and leg swelling.  Gastrointestinal: Negative.  Negative for abdominal distention, abdominal pain, anal bleeding, blood in stool, constipation, diarrhea, nausea and vomiting.  Endocrine: Negative.  Negative for cold intolerance, heat intolerance, polydipsia and polyphagia.  Genitourinary: Positive for frequency and urgency. Negative for difficulty urinating, dyspareunia, dysuria, enuresis, flank pain  and hematuria.  Musculoskeletal: Positive for back pain and gait problem.  Skin: Negative.  Negative for color change, pallor, rash and wound.  Allergic/Immunologic: Negative.   Neurological: Positive for weakness and numbness. Negative for dizziness, tremors, seizures, syncope, facial asymmetry, speech difficulty, light-headedness and headaches.  Hematological: Negative.  Negative for adenopathy.  Psychiatric/Behavioral: Negative.  Negative for agitation,  behavioral problems, confusion, decreased concentration, dysphoric mood, hallucinations, self-injury, sleep disturbance and suicidal ideas. The patient is not nervous/anxious and is not hyperactive.      Objective: Vital Signs: BP 130/87 (BP Location: Left Arm, Patient Position: Sitting)   Pulse 90   Ht 5\' 10"  (1.778 m)   Wt (!) 368 lb (166.9 kg)   LMP 04/10/2006   BMI 52.80 kg/m   Physical Exam  Constitutional: No distress.  HENT:  Mouth/Throat: No oropharyngeal exudate.  Eyes: Right eye exhibits no discharge. Left eye exhibits no discharge. No scleral icterus.  Neck: No JVD present. No tracheal deviation present. No thyromegaly present.  Pulmonary/Chest: No respiratory distress. She has wheezes. She has no rales. She exhibits no tenderness.  Abdominal: She exhibits no distension. There is no tenderness.  Musculoskeletal: She exhibits tenderness. She exhibits no edema or deformity.  Lymphadenopathy:    She has no cervical adenopathy.  Skin: She is not diaphoretic. No erythema. No pallor.  Psychiatric: She has a normal mood and affect. Her behavior is normal. Judgment and thought content normal.    Back Exam   Tenderness  The patient is experiencing tenderness in the lumbar.  Range of Motion  Extension: abnormal  Flexion: abnormal  Lateral Bend Right: abnormal  Lateral Bend Left: abnormal  Rotation Right: abnormal  Rotation Left: abnormal   Muscle Strength  Right Quadriceps:  5/5  Left Quadriceps:  5/5  Right Hamstrings:  5/5  Left Hamstrings:  5/5   Tests  Straight leg raise right: negative Straight leg raise left: negative  Reflexes  Patellar: 0/4 Achilles: 0/4 Babinski's sign: normal   Other  Toe Walk: normal Heel Walk: normal Sensation: normal Gait: normal       Specialty Comments:  No specialty comments available.  Imaging: No results found.   PMFS History: Patient Active Problem List   Diagnosis Date Noted  . Need for immunization  against influenza 11/25/2016  . Encounter to discuss test results 11/25/2016  . Acute abdominal pain 09/24/2016  . Trigger index finger of right hand 07/15/2016  . Trigger finger, left ring finger 06/28/2016  . Stenosing tenosynovitis of finger of left hand 06/28/2016  . Trigger finger, left middle finger 05/13/2016  . Trigger finger, right index finger 05/03/2016  . Prediabetes 05/09/2015  . Healthcare maintenance 04/17/2015  . Left low back pain 09/29/2014  . Back pain 04/07/2014  . Substance abuse ([place]) 11/01/2013  . Chronic diastolic CHF (congestive heart failure) ([place]) 04/07/2012  . Allergic rhinitis 03/29/2011  . GERD 01/26/2010  . MIGRAINE HEADACHE 10/25/2009  . DYSFUNCTIONAL UTERINE BLEEDING 10/25/2009  . ALOPECIA 06/14/2009  . Hypertension 08/04/2008  . OBSTRUCTIVE SLEEP APNEA 02/03/2008  . FIBROCYSTIC BREAST DISEASE 10/28/2006  . Hyperlipidemia 03/27/2006  . OBESITY, NOS 03/27/2006  . TOBACCO DEPENDENCE 03/27/2006  . Depression with anxiety 03/27/2006  . NEUROGENIC BLADDER 03/27/2006  . Dyspnea 03/27/2006   Past Medical History:  Diagnosis Date  . Arthritis 04-10-11   hips, shoulders, back  . Asthma   . Bipolar disorder ([place])   . Cancer ([place]) 1993   cervical, no treatment done, went away per pt  .  Cervical dysplasia or atypia 04-10-11   '93- once dx.-got pregnant-no intervention, then postpartum, no dysplasia found  . CHF (congestive heart failure) ([place])    no cardiologist 2014 dx, none now  . 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 Pathology showed Condyloma, gluteal CONDYLOMA ACUMINATUM.   [name] Kitchen COPD (chronic obstructive pulmonary disease) ([place])   . Depression   . Dyspnea    with actity - better since taking Singular  . Fibrocystic breast disease   . GERD (gastroesophageal reflux disease)   . Hyperlipidemia   . Hypertension   . Migraine headache    none recent  . Neuropathy   . Skin lesion 03/15/2011   In gluteal crease now s/p  removal by Dr. [name] of General Surgery on 3/12. Path shows condyloma.     . Sleep apnea 04-10-11   uses cpap, pt does not know settings  . Trigger finger    left third    Family History  Problem Relation Age of Onset  . Stroke Father   . Cancer Maternal Aunt        breast    Past Surgical History:  Procedure Laterality Date  . ANAL FISTULECTOMY  04/17/2011   Procedure: FISTULECTOMY ANAL;  Surgeon: Imogene Burn. [name], MD;  Location: [facility];  Service: General;  Laterality: N/A;  Excision of Condyloma Gluteal Cleft   . BACK SURGERY  04-10-11   x5-Lumbar fusion-retained hardware.(Dr. Louanne Skye)  . BREAST CYST EXCISION     bilateral breast, 3 cysts removed from each breast  . BREAST EXCISIONAL BIOPSY Right    x 3  . BREAST EXCISIONAL BIOPSY Left    x 3  . CARPAL TUNNEL RELEASE Bilateral   . Cervical biospy    . COLONOSCOPY WITH PROPOFOL N/A 07/06/2015   Procedure: COLONOSCOPY WITH PROPOFOL;  Surgeon: [name], MD;  Location: [facility];  Service: Endoscopy;  Laterality: N/A;  . DOPPLER ECHOCARDIOGRAPHY  04/06/2012   AT [place] 55-60%  . MULTIPLE TOOTH EXTRACTIONS    . NECK SURGERY  04-10-11   x3- cervical fusion with plating and screws-Dr. [name]  . TEE WITHOUT CARDIOVERSION N/A 04/06/2012   Procedure: TRANSESOPHAGEAL ECHOCARDIOGRAM (TEE);  Surgeon: [name], MD;  Location: [facility] ENDOSCOPY;  Service: Cardiovascular;  Laterality: N/A;  . TRIGGER FINGER RELEASE Right    middle finger  . TRIGGER FINGER RELEASE Left 06/28/2016   Procedure: RELEASE TRIGGER FINGER LEFT 3RD FINGER;  Surgeon: [name], MD;  Location: [place];  Service: Orthopedics;  Laterality: Left;  . TRIGGER FINGER RELEASE Right 06/12/2016   Procedure: RIGHT INDEX FINGER TRIGGER RELEASE;  Surgeon: [name], MD;  Location: [place];  Service: Orthopedics;  Laterality: Right;   Social History   Occupational History  . Not on file.   Social History Main Topics  . Smoking status: Current Every Day Smoker     Packs/day: 0.50    Years: 38.00    Types: Cigarettes    Start date: 01/28/1977  . Smokeless tobacco: Never Used  . Alcohol use No  . Drug use: Yes    Types: Marijuana, "Crack" cocaine     Comment: hx marijuana usemany years ago, Crack -none since 11/2015  . Sexual activity: No

## 2016-12-05 ENCOUNTER — Telehealth: Payer: Self-pay | Admitting: *Deleted

## 2016-12-05 ENCOUNTER — Ambulatory Visit (INDEPENDENT_AMBULATORY_CARE_PROVIDER_SITE_OTHER): Payer: Medicare Other | Admitting: Student in an Organized Health Care Education/Training Program

## 2016-12-05 ENCOUNTER — Encounter: Payer: Self-pay | Admitting: Student in an Organized Health Care Education/Training Program

## 2016-12-05 VITALS — BP 124/68 | HR 92 | Temp 98.5°F | Ht 70.0 in | Wt 376.6 lb

## 2016-12-05 DIAGNOSIS — R7303 Prediabetes: Secondary | ICD-10-CM | POA: Diagnosis not present

## 2016-12-05 DIAGNOSIS — N390 Urinary tract infection, site not specified: Secondary | ICD-10-CM | POA: Diagnosis present

## 2016-12-05 DIAGNOSIS — N3941 Urge incontinence: Secondary | ICD-10-CM | POA: Diagnosis not present

## 2016-12-05 DIAGNOSIS — Z6841 Body Mass Index (BMI) 40.0 and over, adult: Secondary | ICD-10-CM | POA: Diagnosis not present

## 2016-12-05 DIAGNOSIS — R635 Abnormal weight gain: Secondary | ICD-10-CM

## 2016-12-05 DIAGNOSIS — R300 Dysuria: Secondary | ICD-10-CM | POA: Diagnosis not present

## 2016-12-05 LAB — POCT URINALYSIS DIP (MANUAL ENTRY)
Nitrite, UA: NEGATIVE
Protein Ur, POC: 100 mg/dL — AB
RBC UA: NEGATIVE
SPEC GRAV UA: 1.02 (ref 1.010–1.025)
UROBILINOGEN UA: 2 U/dL — AB
pH, UA: 7 (ref 5.0–8.0)

## 2016-12-05 LAB — POCT GLYCOSYLATED HEMOGLOBIN (HGB A1C): Hemoglobin A1C: 9.3

## 2016-12-05 MED ORDER — CEPHALEXIN 500 MG PO CAPS: 500.0000 mg | ORAL_CAPSULE | Freq: Two times a day (BID) | ORAL | 0 refills | Status: AC

## 2016-12-05 NOTE — Progress Notes (Signed)
Date of Visit: 12/05/2016   HPI: [name] is a 51 year old female who has an extensive PMH here for urge incontinence. She says that over the last few months she has been incontinent after she gets up and starts to walk to the bathroom. She says that she does not have incontinence when she laughs, sneezes, or walks. She denies dysuria, vaginal discharge, or fever, but does endorse increased frequency as she has been getting up every hour at night to urinate. She says that several years ago she has been seen by urology for this issue, and though she does not remember the medication she was placed on she believed that this helped. She is also concerned with her weight gain over the past year and has gained approximately 45 pounds.   ROS: See HPI.  [place]: Unchanged since previous visit.   PHYSICAL EXAM: BP 124/68   Pulse 92   Temp 98.5 F (36.9 C) (Oral)   Ht 5\' 10"  (1.778 m)   Wt (!) 376 lb 9.6 oz (170.8 kg)   LMP 04/10/2006   SpO2 98%   BMI 54.04 kg/m  Gen: Well appearing pbese female in no acute distress Heart: RRR, symmetric pulses, no murmurs, rubs, or gallops appreciated. Lungs: CTAB, no wheezes, crackles, or rhonchi. Neuro: No focal deficits.  Ext: Moves all extremities equally, peripheral edema.   ASSESSMENT/PLAN: [name] is a 51 year old female with extensive PMH presenting with urge incontinence and 45 pound weight gain over the last year.  # Urge incontinence: Likely due to urinary infection vs new onset diabetes vs possible cystocele. UA performed in clinic showed cloudy urine with trace leukocytes, negative nitrates, and glucose. Due to small volume of urine sample, was unable to spin down. Will send urine for culture and empirically treat with Keflex 500 mg BID for 7 days. Will follow up with Hgb A1C given glucose in urine and new weight gain. If patient does not improve, consider pelvic exam at next visit to assess for anatomical causes of urge incontinence such as  cystocele. We also discussed conservative management treatments including scheduled voiding, Kegel exercises, and decreasing caffeine and alcohol intake.    #Weight gain: Patient has gained approximately 45 pounds over the last year. This is likely due to poor nutrition, minimal physical activity, and the patient's cessation of cocaine use. Following Hgb A1C results, will have patient back for nutrition consult. Patient said she is now baking all her foods instead of frying, and encouraged her to continue to make these changes.  Health maintenance: Received flu vaccination at last visit. Encouraged her to make a dentist appointment for tooth pain. She is up to date on mammogram, pap smear, and colonoscopy at this time.    FOLLOW UP: Follow up in 1 week.

## 2016-12-05 NOTE — Progress Notes (Signed)
   CC: urinary incontinence  HPI: [name] is a 51 y.o. female with PMH significant for obesity, prediabetes, substance abuse who presents to [facility] today with incontinence of several months duration.   Weight gain/History of prediabetes - Patient notes weight gain over the past year (per chart review, 45 pounds in past year) - would like screening for diabetes - is interested in nutrition visit  Incontinence - noted over past few months, worsening recently - Patient reports that she gets off of the couch and immediately needs to urinate, cannot get to the bathroom before urinating on herself - no dysuria - gets up q1H overnight to urinate - patient has seen urology for this problem in the past and tests were completed, she was started on medication. She does not recall the details of this at this time - No N/V/D/C, no vaginal discharge or lesions  Review of Symptoms:  See HPI for ROS.   CC, SH/smoking status, and VS noted.  Objective: BP 124/68   Pulse 92   Temp 98.5 F (36.9 C) (Oral)   Ht 5\' 10"  (1.778 m)   Wt (!) 376 lb 9.6 oz (170.8 kg)   LMP 04/10/2006   SpO2 98%   BMI 54.04 kg/m  GEN: NAD, alert, cooperative, and pleasant. RESPIRATORY: comfortable work of breathing, speaks in full sentences CV: regular rate and rhythm GI: soft, non-distended SKIN: warm and dry, no rashes or lesions  Urinalysis Urine dipstick shows positive for protein, positive for glucose, positive for leukocytes, positive for urobilinogen and positive for ketones.  Micro exam: not done.   Assessment and plan:  Urge incontinence of urine May be multifactorial, with significant weight gain contributing. Uncertain if she will have a diagnosis of diabetes given multiple normal CBGs on BMPs previously, however will test A1c at today's visit. She does have a history of prediabetes (5.7 one year ago) as well as glucose and ketones on UA. Trace leukocytes are noted on UA which is concerning for  infection when taken with symptoms of incontinence and frequency. Unable to spin down the urine (too small of a sample) to more accurately test for epithelial cells, however not many were seen under the microscope. - Will go ahead and treat for UTI with Keflex 500 mg BID x 7 days - recommend kegals - send urine for culture; can adjust antibiotics as needed when susceptibilities become available - will send A1c given urinary symptoms and findings on UA - follow up in one week - patient needs pelvic exam at follow up to test for cystocele given significant weight gain over last year. Held of performing at this visit because we are treating her for an infection today.  OBESITY, NOS Significant recent weight gain.  - testing a1c as noted above - can do dietary counseling or refer to nutrition at next visit    Orders Placed This Encounter  Procedures  . Urine Culture  . Hemoglobin A1c  . POCT urinalysis dipstick  . HgB A1c    Meds ordered this encounter  Medications  . cephALEXin (KEFLEX) 500 MG capsule    Sig: Take 1 capsule (500 mg total) 2 (two) times daily for 7 days by mouth.    Dispense:  14 capsule    Refill:  0    [name], MD,MS,  PGY2 12/05/2016 10:36 AM

## 2016-12-05 NOTE — Telephone Encounter (Signed)
Patient left message on nurse line stating she was just seen and prescribed antibiotics. Patient states she will also need something called in for yeast infection.

## 2016-12-05 NOTE — Patient Instructions (Signed)
It was a pleasure seeing you today in our clinic. Today we discussed your weight gain and urine. Here is the treatment plan we have discussed and agreed upon together:   - We will test your blood sugar today - We will send your urine for culture, however I would like to treat you for a urinary infection with antibiotics. Please complete the course of antibiotics.  - Please make a follow up appointment to be seen in one week   We drew blood work at today's visit. I will call or send you a letter with these results. If you do not hear from me within the next week, please give our office a call.  Our clinic's number is [phone]. Please call with questions or concerns about what we discussed today.  Be well, Dr. Burr Medico

## 2016-12-05 NOTE — Assessment & Plan Note (Addendum)
May be multifactorial, with significant weight gain contributing. Uncertain if she will have a diagnosis of diabetes given multiple normal CBGs on BMPs previously, however will test A1c at today's visit. She does have a history of prediabetes (5.7 one year ago) as well as glucose and ketones on UA. Trace leukocytes are noted on UA which is concerning for infection when taken with symptoms of incontinence and frequency. Unable to spin down the urine (too small of a sample) to more accurately test for epithelial cells, however not many were seen under the microscope. - Will go ahead and treat for UTI with Keflex 500 mg BID x 7 days - recommend kegals - send urine for culture; can adjust antibiotics as needed when susceptibilities become available - will send A1c given urinary symptoms and findings on UA - follow up in one week - patient needs pelvic exam at follow up to test for cystocele given significant weight gain over last year. Held of performing at this visit because we are treating her for an infection today.

## 2016-12-05 NOTE — Assessment & Plan Note (Signed)
Significant recent weight gain.  - testing a1c as noted above - can do dietary counseling or refer to nutrition at next visit

## 2016-12-06 NOTE — Telephone Encounter (Signed)
Patient calling again checking status, states she needs this because she always get a yeast infection when taking antibiotics.

## 2016-12-07 LAB — URINE CULTURE

## 2016-12-09 ENCOUNTER — Other Ambulatory Visit: Payer: Self-pay | Admitting: Student in an Organized Health Care Education/Training Program

## 2016-12-09 MED ORDER — FLUCONAZOLE 150 MG PO TABS: 150.0000 mg | ORAL_TABLET | Freq: Once | ORAL | 0 refills | Status: AC

## 2016-12-10 ENCOUNTER — Other Ambulatory Visit: Payer: Self-pay | Admitting: Student in an Organized Health Care Education/Training Program

## 2016-12-10 MED ORDER — GABAPENTIN 300 MG PO CAPS: ORAL_CAPSULE | ORAL | 3 refills | Status: DC

## 2016-12-10 MED ORDER — HYDROCHLOROTHIAZIDE 25 MG PO TABS: ORAL_TABLET | ORAL | 0 refills | Status: DC

## 2016-12-10 MED ORDER — CITALOPRAM HYDROBROMIDE 20 MG PO TABS: 20.0000 mg | ORAL_TABLET | Freq: Every day | ORAL | 0 refills | Status: DC

## 2016-12-10 MED ORDER — AMLODIPINE BESYLATE 10 MG PO TABS: ORAL_TABLET | ORAL | 0 refills | Status: DC

## 2016-12-11 ENCOUNTER — Encounter: Payer: Self-pay | Admitting: Student in an Organized Health Care Education/Training Program

## 2016-12-11 ENCOUNTER — Other Ambulatory Visit: Payer: Self-pay

## 2016-12-11 ENCOUNTER — Ambulatory Visit (INDEPENDENT_AMBULATORY_CARE_PROVIDER_SITE_OTHER): Payer: Medicare Other | Admitting: Student in an Organized Health Care Education/Training Program

## 2016-12-11 VITALS — BP 125/70 | HR 95 | Temp 98.0°F | Ht 70.0 in | Wt 370.0 lb

## 2016-12-11 DIAGNOSIS — B379 Candidiasis, unspecified: Secondary | ICD-10-CM | POA: Diagnosis not present

## 2016-12-11 DIAGNOSIS — N183 Chronic kidney disease, stage 3 unspecified: Secondary | ICD-10-CM | POA: Insufficient documentation

## 2016-12-11 DIAGNOSIS — E118 Type 2 diabetes mellitus with unspecified complications: Secondary | ICD-10-CM

## 2016-12-11 LAB — GLUCOSE, POCT (MANUAL RESULT ENTRY)

## 2016-12-11 MED ORDER — INSULIN PEN NEEDLE 29G X 5MM MISC: 0 refills | Status: DC

## 2016-12-11 MED ORDER — BLOOD GLUCOSE MONITOR KIT: PACK | 0 refills | Status: DC

## 2016-12-11 MED ORDER — OMEPRAZOLE 20 MG PO CPDR: DELAYED_RELEASE_CAPSULE | ORAL | 0 refills | Status: DC

## 2016-12-11 MED ORDER — ATORVASTATIN CALCIUM 40 MG PO TABS: 40.0000 mg | ORAL_TABLET | Freq: Every day | ORAL | 3 refills | Status: DC

## 2016-12-11 MED ORDER — INSULIN GLARGINE 100 UNIT/ML SOLOSTAR PEN
5.0000 [IU] | PEN_INJECTOR | SUBCUTANEOUS | 99 refills | Status: DC
Start: 2016-12-11 — End: 2016-12-13

## 2016-12-11 MED ORDER — LOSARTAN POTASSIUM 100 MG PO TABS: 100.0000 mg | ORAL_TABLET | Freq: Every day | ORAL | 3 refills | Status: DC

## 2016-12-11 MED ORDER — MONTELUKAST SODIUM 10 MG PO TABS: ORAL_TABLET | ORAL | 0 refills | Status: DC

## 2016-12-11 MED ORDER — FLUCONAZOLE 150 MG PO TABS: 150.0000 mg | ORAL_TABLET | Freq: Once | ORAL | 0 refills | Status: AC

## 2016-12-11 NOTE — Progress Notes (Signed)
   Patient presents for education on Lantus Solostar pen per Dr. Burr Medico  Patient instructed on all aspects of pen usage including applying pen needle, priming, dialing to 5 units, injecting, appropriate sites to inject, removing pen needle and disposing properly in sharps container. Patient performed excellent return demonstration.  Discussed using heavy plastic container for sharps such as empty laundry container, securing cap with duct tape and then throwing in trash.  S/sx of hypoglycemia and immediate actions to take discussed in detail  Patient aware of f/u appt this Friday with Dr. Lorin Glass, RN, BSN

## 2016-12-11 NOTE — Assessment & Plan Note (Addendum)
Most recent GFR 43. Rechecking BMP at today's visit. Did not discuss this kidney disease with the patient at today's visit because she was emotionally overwhelmed with type 2 diabetes as a new diagnosis, however when I next see her, will need to discuss renal referral with her to be followed for CKD 3b.

## 2016-12-11 NOTE — Assessment & Plan Note (Signed)
New diagnosis with A1c 9.3, symptomatic with urinary frequency and incontinence.  Of note, her CBG is >600 (office meter only goes to 600). Would have started metformin, however GFR most recently was 43. Considered initiating oral agents, however with profound hyperglycemia, feel it is best to initiate insulin today. - provided glucometer, test strips, lancets prescription to check TID before meals - Lantus 5u qAM (insulin naive), with instructions to increase by one unit each morning if blood sugars are >300 - Nursing to do insulin teaching today in the office - will check BMP for more accurate blood sugar, also to check for anion gap and renal function - WILL NEED REFERRAL TO RENAL for GFR <45, however too much to discuss today so defer to future visit - Follow up in our office in 3 days: will need CBG checked, go over diabetes teaching again, adjust Lantus as appropriate - patient instructed to bring CBG record to Friday's visit - Next available appt with Dr. Valentina Lucks for insulin teaching is 11/26, so this was scheduled - Patient should have frequent and close follow up in our clinic to prevent hospitalization from poorly controlled diabetes, please consider scheduling close follow up after Friday.

## 2016-12-11 NOTE — Patient Instructions (Addendum)
It was a pleasure seeing you today in our clinic. Today we discussed your new diagnosis of diabetes. Here is the treatment plan we have discussed and agreed upon together:  Your HbA1c is 9.3. Your goal is to have an HbA1c less than 6.5. We need you to start checking your blood sugar three times per day, before each meal. Please RECORD these blood sugars and bring the record to each visit.   - We started a new medication today called LANTUS, which is a form of insulin. You should take this medication EVERY DAY in the morning after you check your blood sugar.   1. start with 5 units of Lantus 2.  If your sugar is greater than 300 tomorrow before breakfast, increase Lantus by one unit (so take 6 units). Continue to increase by one unit every day IF sugars are greater than 300 in the morning before breakfast.   - You have an appointment at 2:30 PM on Friday 11/9. - You have an appointment with Dr. Valentina Lucks on November 26 at 10:30 AM.  If you develop symptoms of low blood sugar including heart racing, sweating, anxiety or confusion, EAT SOMETHING like orange juice or a piece of fruit and then CHECK your sugar.  Our clinic's number is [phone]. Please call with questions or concerns about what we discussed today.  Be well, Dr. Burr Medico                 Fasting Blood Sugar Record  Day  Date      Blood      Time:     Sugar  Day  Date      Blood      Time:     Sugar                                                                                                                                                                                                                         If a reading is high or low, then look back to what the last meal/snack was:  Write down what you last ate, how much, & what time.

## 2016-12-11 NOTE — Assessment & Plan Note (Signed)
Vaginal discharge and pruritis consistent with previous yeast infections, in setting of poorly controlled sugars and recent antibiotics use for UTI.  - Fluconazole. Two pills provided.  - Follow up that symptoms improve when in office on Friday.

## 2016-12-11 NOTE — Progress Notes (Signed)
CC: follow up  HPI: [name] is a 51 y.o. female with PMH significant for obesity, prediabetes, substance abuse who presents to [facility] today for follow up with new diagnosis of diabetes.   Patient was seen 1 week ago, where she was noted to have urge incontinence with UA that was concerning for UTI versus possible new diabetes. Additionally she had a recent 45 pound weight gain. Because of this she was tested for diabetes, and her A1c was elevated at 9.3. It was 5.7 one year ago. She comes back today for follow-up, where she is notified of her diagnosis of diabetes.  Point-of-care blood glucose was tested in the office today, and she was noted to have levels over 600, the meter only goes up to 600. Because of this, decision was made to initiate insulin at today's visit. She is a candidate for metformin due to kidney function.  Yeast infection Patient endorses vaginal pruritus and discharge consistent with previous yeast infection symptoms. She did just complete a course of Keflex for UTI. She was given 1 pill of fluconazole, however feels her symptoms have not resolved. She asks for another dose today.  CKD3b Looking back at previous CMP, kidney function is intake 83 range. This was not discussed at today's visit because the patient had to absorb a new diagnosis of diabetes. However be appropriate to refer to renal for annual visits in the future if kidney function appears to be at this level at baseline  Monitoring Labs and Parameters Last A1C:  Lab Results  Component Value Date   HGBA1C 9.3 12/05/2016   Review of Symptoms:  See HPI for ROS.   CC, SH/smoking status, and VS noted.  Objective: BP 125/70 (BP Location: Right Wrist, Patient Position: Sitting, Cuff Size: Normal)   Pulse 95   Temp 98 F (36.7 C) (Oral)   Ht '5\' 10"'  (1.778 m)   Wt (!) 370 lb (167.8 kg)   LMP 04/10/2006   SpO2 95%   BMI 53.09 kg/m  GEN: NAD, alert, cooperative RESPIRATORY:comfortable work of  breathing, CTA bil CV: RRR, no m/r/g GI: soft, non-tender SKIN: warm and dry, no rashes or lesions PSYCH: AAOx3, appropriate affect, appropriately intermittently tearful  Assessment and plan:  Type 2 diabetes mellitus with complication, without long-term current use of insulin (HCC) New diagnosis with A1c 9.3, symptomatic with urinary frequency and incontinence.  Of note, her CBG is >600 (office meter only goes to 600). Would have started metformin, however GFR most recently was 43. Considered initiating oral agents, however with profound hyperglycemia, feel it is best to initiate insulin today. - provided glucometer, test strips, lancets prescription to check TID before meals - Lantus 5u qAM (insulin naive), with instructions to increase by one unit each morning if blood sugars are >300 - Nursing to do insulin teaching today in the office - will check BMP for more accurate blood sugar, also to check for anion gap and renal function - WILL NEED REFERRAL TO RENAL for GFR <45, however too much to discuss today so defer to future visit - Follow up in our office in 3 days: will need CBG checked, go over diabetes teaching again, adjust Lantus as appropriate - patient instructed to bring CBG record to Friday's visit - Next available appt with Dr. Valentina Lucks for insulin teaching is 11/26, so this was scheduled - Patient should have frequent and close follow up in our clinic to prevent hospitalization from poorly controlled diabetes, please consider scheduling close follow  up after Friday.  Yeast infection Vaginal discharge and pruritis consistent with previous yeast infections, in setting of poorly controlled sugars and recent antibiotics use for UTI.  - Fluconazole. Two pills provided.  - Follow up that symptoms improve when in office on Friday.  CKD (chronic kidney disease) stage 3, GFR 30-59 ml/min (HCC) Most recent GFR 43. Rechecking BMP at today's visit. Did not discuss this kidney disease with  the patient at today's visit because she was emotionally overwhelmed with type 2 diabetes as a new diagnosis, however when I next see her, will need to discuss renal referral with her to be followed for CKD 3b.   Orders Placed This Encounter  Procedures  . Basic Metabolic Panel    Order Specific Question:   Has the patient fasted?    Answer:   No  . Glucose (CBG)    Meds ordered this encounter  Medications  . Insulin Glargine (LANTUS SOLOSTAR) 100 UNIT/ML Solostar Pen    Sig: Inject 5 Units every morning into the skin. If your fasting blood sugar in the morning is greater than 300, increase your lantus dose by ONE unit.    Dispense:  5 pen    Refill:  PRN  . blood glucose meter kit and supplies KIT    Sig: Dispense based on patient and insurance preference. Use up to four times daily as directed. (FOR ICD-9 250.00, 250.01).    Dispense:  1 each    Refill:  0    Order Specific Question:   Number of strips    Answer:   3    Order Specific Question:   Number of lancets    Answer:   90  . atorvastatin (LIPITOR) 40 MG tablet    Sig: Take 1 tablet (40 mg total) daily by mouth.    Dispense:  90 tablet    Refill:  3  . montelukast (SINGULAIR) 10 MG tablet    Sig: TAKE 1 TABLET AT BEDTIME AS DIRECTED.    Dispense:  30 tablet    Refill:  0  . omeprazole (PRILOSEC) 20 MG capsule    Sig: TAKE (1) CAPSULE DAILY.    Dispense:  30 capsule    Refill:  0  . losartan (COZAAR) 100 MG tablet    Sig: Take 1 tablet (100 mg total) daily by mouth.    Dispense:  90 tablet    Refill:  3  . fluconazole (DIFLUCAN) 150 MG tablet    Sig: Take 1 tablet (150 mg total) once for 1 dose by mouth.    Dispense:  2 tablet    Refill:  0  . Insulin Pen Needle 29G X 5MM MISC    Sig: Check sugars TID before meals.    Dispense:  90 each    Refill:  0    [name], MD,MS,  PGY2 12/11/2016 9:55 AM

## 2016-12-12 ENCOUNTER — Telehealth: Payer: Self-pay | Admitting: *Deleted

## 2016-12-12 ENCOUNTER — Other Ambulatory Visit: Payer: Self-pay | Admitting: Student in an Organized Health Care Education/Training Program

## 2016-12-12 ENCOUNTER — Encounter (HOSPITAL_COMMUNITY): Payer: Self-pay | Admitting: *Deleted

## 2016-12-12 ENCOUNTER — Observation Stay (HOSPITAL_COMMUNITY)
Admission: EM | Admit: 2016-12-12 | Discharge: 2016-12-13 | Disposition: A | Discharge status: Home / Self Care | Payer: Medicare Other | Attending: Family Medicine | Admitting: Family Medicine

## 2016-12-12 DIAGNOSIS — E785 Hyperlipidemia, unspecified: Secondary | ICD-10-CM | POA: Insufficient documentation

## 2016-12-12 DIAGNOSIS — M19012 Primary osteoarthritis, left shoulder: Secondary | ICD-10-CM | POA: Insufficient documentation

## 2016-12-12 DIAGNOSIS — I130 Hypertensive heart and chronic kidney disease with heart failure and stage 1 through stage 4 chronic kidney disease, or unspecified chronic kidney disease: Secondary | ICD-10-CM | POA: Insufficient documentation

## 2016-12-12 DIAGNOSIS — F1410 Cocaine abuse, uncomplicated: Secondary | ICD-10-CM | POA: Insufficient documentation

## 2016-12-12 DIAGNOSIS — E118 Type 2 diabetes mellitus with unspecified complications: Secondary | ICD-10-CM

## 2016-12-12 DIAGNOSIS — E1165 Type 2 diabetes mellitus with hyperglycemia: Secondary | ICD-10-CM | POA: Diagnosis not present

## 2016-12-12 DIAGNOSIS — Z881 Allergy status to other antibiotic agents status: Secondary | ICD-10-CM | POA: Insufficient documentation

## 2016-12-12 DIAGNOSIS — E876 Hypokalemia: Secondary | ICD-10-CM

## 2016-12-12 DIAGNOSIS — I5032 Chronic diastolic (congestive) heart failure: Secondary | ICD-10-CM | POA: Insufficient documentation

## 2016-12-12 DIAGNOSIS — Z794 Long term (current) use of insulin: Secondary | ICD-10-CM | POA: Insufficient documentation

## 2016-12-12 DIAGNOSIS — L8990 Pressure ulcer of unspecified site, unspecified stage: Secondary | ICD-10-CM

## 2016-12-12 DIAGNOSIS — Z79899 Other long term (current) drug therapy: Secondary | ICD-10-CM | POA: Insufficient documentation

## 2016-12-12 DIAGNOSIS — N184 Chronic kidney disease, stage 4 (severe): Secondary | ICD-10-CM | POA: Insufficient documentation

## 2016-12-12 DIAGNOSIS — Z888 Allergy status to other drugs, medicaments and biological substances status: Secondary | ICD-10-CM | POA: Insufficient documentation

## 2016-12-12 DIAGNOSIS — Z8541 Personal history of malignant neoplasm of cervix uteri: Secondary | ICD-10-CM | POA: Insufficient documentation

## 2016-12-12 DIAGNOSIS — J449 Chronic obstructive pulmonary disease, unspecified: Secondary | ICD-10-CM | POA: Insufficient documentation

## 2016-12-12 DIAGNOSIS — N183 Chronic kidney disease, stage 3 unspecified: Secondary | ICD-10-CM | POA: Diagnosis present

## 2016-12-12 DIAGNOSIS — M479 Spondylosis, unspecified: Secondary | ICD-10-CM | POA: Insufficient documentation

## 2016-12-12 DIAGNOSIS — G4733 Obstructive sleep apnea (adult) (pediatric): Secondary | ICD-10-CM | POA: Insufficient documentation

## 2016-12-12 DIAGNOSIS — M160 Bilateral primary osteoarthritis of hip: Secondary | ICD-10-CM | POA: Insufficient documentation

## 2016-12-12 DIAGNOSIS — Z981 Arthrodesis status: Secondary | ICD-10-CM | POA: Insufficient documentation

## 2016-12-12 DIAGNOSIS — M19011 Primary osteoarthritis, right shoulder: Secondary | ICD-10-CM | POA: Insufficient documentation

## 2016-12-12 DIAGNOSIS — K219 Gastro-esophageal reflux disease without esophagitis: Secondary | ICD-10-CM | POA: Insufficient documentation

## 2016-12-12 DIAGNOSIS — N179 Acute kidney failure, unspecified: Secondary | ICD-10-CM

## 2016-12-12 DIAGNOSIS — E1100 Type 2 diabetes mellitus with hyperosmolarity without nonketotic hyperglycemic-hyperosmolar coma (NKHHC): Principal | ICD-10-CM | POA: Insufficient documentation

## 2016-12-12 DIAGNOSIS — F418 Other specified anxiety disorders: Secondary | ICD-10-CM | POA: Insufficient documentation

## 2016-12-12 DIAGNOSIS — F17210 Nicotine dependence, cigarettes, uncomplicated: Secondary | ICD-10-CM | POA: Insufficient documentation

## 2016-12-12 DIAGNOSIS — F319 Bipolar disorder, unspecified: Secondary | ICD-10-CM | POA: Insufficient documentation

## 2016-12-12 DIAGNOSIS — E1122 Type 2 diabetes mellitus with diabetic chronic kidney disease: Secondary | ICD-10-CM | POA: Diagnosis present

## 2016-12-12 DIAGNOSIS — R739 Hyperglycemia, unspecified: Secondary | ICD-10-CM

## 2016-12-12 HISTORY — DX: Type 2 diabetes mellitus without complications: E11.9

## 2016-12-12 LAB — CBG MONITORING, ED
GLUCOSE-CAPILLARY: 191 mg/dL — AB (ref 65–99)
GLUCOSE-CAPILLARY: 199 mg/dL — AB (ref 65–99)
GLUCOSE-CAPILLARY: 241 mg/dL — AB (ref 65–99)
GLUCOSE-CAPILLARY: 244 mg/dL — AB (ref 65–99)
Glucose-Capillary: >600 mg/dL (ref 65–99)
Glucose-Capillary: >600 mg/dL (ref 65–99)
Glucose-Capillary: 374 mg/dL — ABNORMAL HIGH (ref 65–99)

## 2016-12-12 LAB — URINALYSIS, ROUTINE W REFLEX MICROSCOPIC
Bacteria, UA: NONE SEEN
Bilirubin Urine: NEGATIVE
KETONES UR: NEGATIVE mg/dL
LEUKOCYTES UA: NEGATIVE
NITRITE: NEGATIVE
PH: 5 (ref 5.0–8.0)
Protein, ur: NEGATIVE mg/dL
SPECIFIC GRAVITY, URINE: 1.023 (ref 1.005–1.030)

## 2016-12-12 LAB — URINALYSIS, COMPLETE (UACMP) WITH MICROSCOPIC
BACTERIA UA: NONE SEEN
Bilirubin Urine: NEGATIVE
Glucose, UA: >=500 mg/dL — AB
KETONES UR: NEGATIVE mg/dL
LEUKOCYTES UA: NEGATIVE
Nitrite: NEGATIVE
PROTEIN: NEGATIVE mg/dL
Specific Gravity, Urine: 1.022 (ref 1.005–1.030)
pH: 6 (ref 5.0–8.0)

## 2016-12-12 LAB — BASIC METABOLIC PANEL
Anion gap: 13 (ref 5–15)
Anion gap: 17 — ABNORMAL HIGH (ref 5–15)
Anion gap: 11 (ref 5–15)
BUN / CREAT RATIO: 11 (ref 9–23)
BUN: 22 mg/dL (ref 6–24)
BUN: 23 mg/dL — AB (ref 6–20)
BUN: 22 mg/dL — AB (ref 6–20)
BUN: 20 mg/dL (ref 6–20)
CALCIUM: 9 mg/dL (ref 8.9–10.3)
CHLORIDE: 94 mmol/L — AB (ref 101–111)
CHLORIDE: 96 mmol/L — AB (ref 101–111)
CO2: 20 mmol/L (ref 20–29)
CO2: 26 mmol/L (ref 22–32)
CO2: 20 mmol/L — AB (ref 22–32)
CO2: 27 mmol/L (ref 22–32)
CREATININE: 2.04 mg/dL — AB (ref 0.57–1.00)
CREATININE: 2.33 mg/dL — AB (ref 0.44–1.00)
CREATININE: 2.24 mg/dL — AB (ref 0.44–1.00)
CREATININE: 1.84 mg/dL — AB (ref 0.44–1.00)
Calcium: 9.4 mg/dL (ref 8.7–10.2)
Calcium: 8.8 mg/dL — ABNORMAL LOW (ref 8.9–10.3)
Calcium: 8.8 mg/dL — ABNORMAL LOW (ref 8.9–10.3)
Chloride: 78 mmol/L — ABNORMAL LOW (ref 96–106)
Chloride: 80 mmol/L — ABNORMAL LOW (ref 101–111)
GFR calc Af Amer: 31 mL/min/{1.73_m2} — ABNORMAL LOW (ref >59)
GFR calc Af Amer: 26 mL/min — ABNORMAL LOW (ref >60)
GFR calc Af Amer: 35 mL/min — ABNORMAL LOW (ref >60)
GFR calc non Af Amer: 24 mL/min — ABNORMAL LOW (ref >60)
GFR calc non Af Amer: 30 mL/min — ABNORMAL LOW (ref >60)
GFR, EST AFRICAN AMERICAN: 28 mL/min — AB (ref >60)
GFR, EST NON AFRICAN AMERICAN: 27 mL/min/{1.73_m2} — AB (ref >59)
GFR, EST NON AFRICAN AMERICAN: 23 mL/min — AB (ref >60)
GLUCOSE: 499 mg/dL — AB (ref 65–99)
Glucose, Bld: 972 mg/dL (ref 65–99)
Glucose, Bld: 188 mg/dL — ABNORMAL HIGH (ref 65–99)
Glucose: 920 mg/dL (ref 65–99)
Potassium: 4.9 mmol/L (ref 3.5–5.2)
Potassium: 4.5 mmol/L (ref 3.5–5.1)
Potassium: 3.6 mmol/L (ref 3.5–5.1)
Potassium: 3.5 mmol/L (ref 3.5–5.1)
SODIUM: 122 mmol/L — AB (ref 134–144)
SODIUM: 119 mmol/L — AB (ref 135–145)
SODIUM: 134 mmol/L — AB (ref 135–145)
Sodium: 131 mmol/L — ABNORMAL LOW (ref 135–145)

## 2016-12-12 LAB — CBC
HCT: 43 % (ref 36.0–46.0)
Hemoglobin: 15.8 g/dL — ABNORMAL HIGH (ref 12.0–15.0)
MCH: 29.2 pg (ref 26.0–34.0)
MCHC: 36.7 g/dL — AB (ref 30.0–36.0)
MCV: 79.3 fL (ref 78.0–100.0)
PLATELETS: 330 10*3/uL (ref 150–400)
RBC: 5.42 MIL/uL — ABNORMAL HIGH (ref 3.87–5.11)
RDW: 13.2 % (ref 11.5–15.5)
WBC: 11.3 10*3/uL — AB (ref 4.0–10.5)

## 2016-12-12 LAB — MAGNESIUM: MAGNESIUM: 2.1 mg/dL (ref 1.7–2.4)

## 2016-12-12 LAB — PHOSPHORUS: Phosphorus: 3.5 mg/dL (ref 2.5–4.6)

## 2016-12-12 MED ORDER — SODIUM CHLORIDE 0.9 % IV SOLN
INTRAVENOUS | Status: AC
  Administered 2016-12-12: 19:00:00 via INTRAVENOUS

## 2016-12-12 MED ORDER — SODIUM CHLORIDE 0.9 % IV SOLN
INTRAVENOUS | Status: DC
  Filled 2016-12-12: qty 1

## 2016-12-12 MED ORDER — BUSPIRONE HCL 10 MG PO TABS
10.0000 mg | ORAL_TABLET | Freq: Three times a day (TID) | ORAL | Status: DC
  Administered 2016-12-12 – 2016-12-13 (×2): 10 mg via ORAL
  Filled 2016-12-12 (×3): qty 1

## 2016-12-12 MED ORDER — SODIUM CHLORIDE 0.9 % IV BOLUS (SEPSIS)
1000.0000 mL | Freq: Once | INTRAVENOUS | Status: AC
  Administered 2016-12-12: 1000 mL via INTRAVENOUS

## 2016-12-12 MED ORDER — POTASSIUM CHLORIDE 10 MEQ/100ML IV SOLN
10.0000 meq | INTRAVENOUS | Status: AC
  Administered 2016-12-12 (×2): 10 meq via INTRAVENOUS
  Filled 2016-12-12 (×2): qty 100

## 2016-12-12 MED ORDER — INSULIN GLARGINE 100 UNIT/ML ~~LOC~~ SOLN
10.0000 [IU] | Freq: Every day | SUBCUTANEOUS | Status: DC
  Administered 2016-12-12: 10 [IU] via SUBCUTANEOUS
  Filled 2016-12-12 (×2): qty 0.1

## 2016-12-12 MED ORDER — DEXTROSE-NACL 5-0.45 % IV SOLN
INTRAVENOUS | Status: DC
  Administered 2016-12-12: 21:00:00 via INTRAVENOUS

## 2016-12-12 MED ORDER — POTASSIUM CHLORIDE CRYS ER 20 MEQ PO TBCR: 40.0000 meq | EXTENDED_RELEASE_TABLET | Freq: Two times a day (BID) | ORAL | Status: DC

## 2016-12-12 MED ORDER — MOMETASONE FURO-FORMOTEROL FUM 200-5 MCG/ACT IN AERO
2.0000 | INHALATION_SPRAY | Freq: Two times a day (BID) | RESPIRATORY_TRACT | Status: DC
  Filled 2016-12-12: qty 8.8

## 2016-12-12 MED ORDER — CITALOPRAM HYDROBROMIDE 20 MG PO TABS
20.0000 mg | ORAL_TABLET | Freq: Every day | ORAL | Status: DC
  Administered 2016-12-13: 20 mg via ORAL
  Filled 2016-12-12: qty 1

## 2016-12-12 MED ORDER — SODIUM CHLORIDE 0.9 % IV SOLN
INTRAVENOUS | Status: DC
  Administered 2016-12-12 – 2016-12-13 (×2): via INTRAVENOUS

## 2016-12-12 MED ORDER — SODIUM CHLORIDE 0.9 % IV SOLN
INTRAVENOUS | Status: DC
  Administered 2016-12-12: 5.4 [IU]/h via INTRAVENOUS
  Filled 2016-12-12: qty 1

## 2016-12-12 MED ORDER — HEPARIN SODIUM (PORCINE) 5000 UNIT/ML IJ SOLN
5000.0000 [IU] | Freq: Three times a day (TID) | INTRAMUSCULAR | Status: DC
  Administered 2016-12-12 – 2016-12-13 (×3): 5000 [IU] via SUBCUTANEOUS
  Filled 2016-12-12 (×2): qty 1

## 2016-12-12 MED ORDER — SODIUM CHLORIDE 0.9 % IV BOLUS (SEPSIS): 1000.0000 mL | Freq: Once | INTRAVENOUS | Status: DC

## 2016-12-12 MED ORDER — DEXTROSE-NACL 5-0.45 % IV SOLN: INTRAVENOUS | Status: DC

## 2016-12-12 MED ORDER — INSULIN ASPART 100 UNIT/ML ~~LOC~~ SOLN
0.0000 [IU] | Freq: Three times a day (TID) | SUBCUTANEOUS | Status: DC
  Administered 2016-12-13: 7 [IU] via SUBCUTANEOUS
  Administered 2016-12-13: 3 [IU] via SUBCUTANEOUS
  Filled 2016-12-12 (×2): qty 1

## 2016-12-12 MED ORDER — ATORVASTATIN CALCIUM 40 MG PO TABS
40.0000 mg | ORAL_TABLET | Freq: Every day | ORAL | Status: DC
  Administered 2016-12-13: 40 mg via ORAL
  Filled 2016-12-12 (×2): qty 1

## 2016-12-12 MED ORDER — CYCLOBENZAPRINE HCL 10 MG PO TABS
5.0000 mg | ORAL_TABLET | Freq: Once | ORAL | Status: AC
  Administered 2016-12-12: 5 mg via ORAL
  Filled 2016-12-12: qty 1

## 2016-12-12 MED ORDER — SODIUM CHLORIDE 0.9 % IV SOLN: INTRAVENOUS | Status: DC

## 2016-12-12 NOTE — Telephone Encounter (Signed)
Noted abnormal BMET. Asked patietn to come in to ED, likely needs to be admitted for insulin gtt.

## 2016-12-12 NOTE — ED Notes (Signed)
Patient will not listen to nurse or tech about staying seated. We have tried to get patient to lie down so we can hook her back up to monitor and BP cuff appropriately but patient will not do what we say.  IV in head had to be taped back up because patient about ripped it out.  Waiting for pharmacy to verify med orders so this nurse can give patient flexeril for muscle spasms.

## 2016-12-12 NOTE — H&P (Signed)
[facility] Admission History and Physical Service Pager: [phone]  Patient name: [name] Medical record number: [id number] Date of birth: [birth year] Age: 51 y.o. Gender: female  Primary Care Provider: [name], MD Consultants:  Code Status: full  Chief Complaint: HHS  Assessment and Plan: [name] is a 51 y.o. female presenting with HHS in setting of new DM diagnosis. PMH is significant for HTN, hyperlipidemia, morbid obesity, substance abuse, HFpEF, COPD.  Newly diagnosed T2DM with severe hyperglycemia concerning for HHS:  Meeting criteria for HHS on admission with hyperglycemia >600, serum bicarbonate >15, and no ketonuria. Patient was diagnosed with T2DM in the office yesterday, found to have glucose elevated >900 on BMP and sent in out of concern for DKA/HHS. She has had urinary symptoms only until new cramping in ED. No claim of confusion or AMS.  Received 2L bolus in ED NS. - admit to stepdown attending Dr. Andria Osborne - HHS protocol, give normal saline (s/p 2 1L boluses in ED) and switch to D5NS  at 300 cbg. -suggest lantus 10 when transtitioned off pump when glucose <200. Insulin by weight would be about 16 units (167 kilo) but will be conservative since she is insulin naive so as not to drop her glucose too low - BMP q4hr - flexeril given 1x  for cramping - diabetes educator consult - RD referral   CKD- Stage 4 on admission, stage 3 chronic, unsure if new baseline or acute on chronic  -avoid nephrotoxic medications -patient will need initial nephro referal on d/c -held gabapentin/losartin/hctz  HLD- uncontrolled as of 7 months ago in clinic -home atorvastatin 40 -repeat lipid panel  HTN -fluctating BP during admission from 150s-100 sbp -hold home BP meds temp to avoid hypotension  History of substance abuse (crack)  -avoid B-blockers   HFpEF 55-60%- from echo 6 months ago, no current SOB/edema -will monitor for fluid  overload given significant rehydration involved with HHS protocol  COPD- no current SOB complaints -home ventolin -home singulair -home spiriva  ?Paroxysmal afib? - noted by nursing on the monitor in ED, however ECG is NSR so cannot diagnose Afib. Currently rate controlled at 82. Continue to monitor.  FEN/GI: diabetic diet, pantoprazole Prophylaxis: lovenox  Disposition: step down until off insulin drip  History of Present Illness:  [name] is a 51 y.o. female presenting with HHS in setting of first time DM diagnosis.  PMH is significant for HTN, hyperlipidemia, morbid obesity, substance abuse, HFpEF, COPD.  Patient had been to clinc ~1 week ago complaining of urinary incontinence.  UA indicated potential diabetes and subsequent A1C was 9.3.   Patient was brought to clinic and prescribed starting insulin regimen with slowly escalating lantus when f/u lab showed CBG >900 and anion gap>20.   She was then instructed to report to ED for management out of concern for DKA/HHS.  Upon arrival to ED, CBG was elevated >900 but anion gap was 13.   She was started on insulin drip with 2L NS bolus and FMTS was called for admission.  In the ED patient is denying abdominal pain, nausea, vomiting, or diarrhea. No CP or dyspnea. She is having severe LE muscle cramps. She continues to have urinary incontinence.   Review Of Systems: Per HPI with the following additions:   ROS  Patient Active Problem List   Diagnosis Date Noted  . Type 2 diabetes mellitus with hyperosmolarity without nonketotic hyperglycemic-hyperosmolar coma ([place]) 12/12/2016  . Type 2 diabetes mellitus with  complication, without long-term current use of insulin ([place]) 12/11/2016  . Yeast infection 12/11/2016  . CKD (chronic kidney disease) stage 3, GFR 30-59 ml/min (HCC) 12/11/2016  . Urge incontinence of urine 12/05/2016  . Need for immunization against influenza 11/25/2016  . Stenosing tenosynovitis of finger of left hand  06/28/2016  . Prediabetes 05/09/2015  . Substance abuse ([place]) 11/01/2013  . Chronic diastolic CHF (congestive heart failure) ([place]) 04/07/2012  . Allergic rhinitis 03/29/2011  . GERD 01/26/2010  . MIGRAINE HEADACHE 10/25/2009  . DYSFUNCTIONAL UTERINE BLEEDING 10/25/2009  . ALOPECIA 06/14/2009  . Hypertension 08/04/2008  . OBSTRUCTIVE SLEEP APNEA 02/03/2008  . FIBROCYSTIC BREAST DISEASE 10/28/2006  . Hyperlipidemia 03/27/2006  . OBESITY, NOS 03/27/2006  . TOBACCO DEPENDENCE 03/27/2006  . Depression with anxiety 03/27/2006  . NEUROGENIC BLADDER 03/27/2006  . Dyspnea 03/27/2006    Past Medical History: Past Medical History:  Diagnosis Date  . Arthritis 04-10-11   hips, shoulders, back  . Asthma   . Bipolar disorder ([place])   . Cancer ([place]) 1993   cervical, no treatment done, went away per pt  . Cervical dysplasia or atypia 04-10-11   '93- once dx.-got pregnant-no intervention, then postpartum, no dysplasia found  . CHF (congestive heart failure) ([place])    no cardiologist 2014 dx, none now  . 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 Pathology showed Condyloma, gluteal CONDYLOMA ACUMINATUM.   [name] Kitchen COPD (chronic obstructive pulmonary disease) ([place])   . Depression   . Diabetes mellitus without complication ([place])   . Dyspnea    with actity - better since taking Singular  . Fibrocystic breast disease   . GERD (gastroesophageal reflux disease)   . Hyperlipidemia   . Hypertension   . Migraine headache    none recent  . Neuropathy   . Skin lesion 03/15/2011   In gluteal crease now s/p removal by Dr. Georgette Osborne of General Surgery on 3/12. Path shows condyloma.     . Sleep apnea 04-10-11   uses cpap, pt does not know settings  . Trigger finger    left third    Past Surgical History: Past Surgical History:  Procedure Laterality Date  . ANAL FISTULECTOMY  04/17/2011   Procedure: FISTULECTOMY ANAL;  Surgeon: [name]. Mary Dover, MD;  Location: [facility] ORS;  Service: General;   Laterality: N/A;  Excision of Condyloma Gluteal Cleft   . BACK SURGERY  04-10-11   x5-Lumbar fusion-retained hardware.(Dr. Louanne Osborne)  . BREAST CYST EXCISION     bilateral breast, 3 cysts removed from each breast  . BREAST EXCISIONAL BIOPSY Right    x 3  . BREAST EXCISIONAL BIOPSY Left    x 3  . CARPAL TUNNEL RELEASE Bilateral   . Cervical biospy    . COLONOSCOPY WITH PROPOFOL N/A 07/06/2015   Procedure: COLONOSCOPY WITH PROPOFOL;  Surgeon: [name], MD;  Location: [facility] ENDOSCOPY;  Service: Endoscopy;  Laterality: N/A;  . DOPPLER ECHOCARDIOGRAPHY  04/06/2012   AT [place] 55-60%  . MULTIPLE TOOTH EXTRACTIONS    . NECK SURGERY  04-10-11   x3- cervical fusion with plating and screws-Dr. [name]  . TEE WITHOUT CARDIOVERSION N/A 04/06/2012   Procedure: TRANSESOPHAGEAL ECHOCARDIOGRAM (TEE);  Surgeon: [name], MD;  Location: [facility] ENDOSCOPY;  Service: Cardiovascular;  Laterality: N/A;  . TRIGGER FINGER RELEASE Right    middle finger  . TRIGGER FINGER RELEASE Left 06/28/2016   Procedure: RELEASE TRIGGER FINGER LEFT 3RD FINGER;  Surgeon: [name], MD;  Location: [facility];  Service: Orthopedics;  Laterality: Left;  . TRIGGER FINGER RELEASE Right 06/12/2016   Procedure: RIGHT INDEX FINGER TRIGGER RELEASE;  Surgeon: [name], MD;  Location: [facility];  Service: Orthopedics;  Laterality: Right;    Social History: Social History   Tobacco Use  . Smoking status: Current Every Day Smoker    Packs/day: 0.50    Years: 38.00    Pack years: 19.00    Types: Cigarettes    Start date: 01/28/1977  . Smokeless tobacco: Never Used  Substance Use Topics  . Alcohol use: No    Alcohol/week: 0.0 oz  . Drug use: Yes    Types: Marijuana, "Crack" cocaine    Comment: hx marijuana usemany years ago, Crack -none since 11/2015   Additional social history:   Please also refer to relevant sections of EMR.  Family History: Family History  Problem Relation Age of Onset  . Stroke Father   . Cancer  Maternal Aunt        breast   (If not completed, MUST add something in)  Allergies and Medications: Allergies  Allergen Reactions  . 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 Levofloxacin Itching  . Nicotine Rash and Other (See Comments)    Patch caused a rash   No current facility-administered medications on file prior to encounter.    Current Outpatient Medications on File Prior to Encounter  Medication Sig Dispense Refill  . amLODipine (NORVASC) 10 MG tablet TAKE 1 TABLET ONCE DAILY FOR BLOOD PRESSURE. (Patient taking differently: 10 mg daily. FOR BLOOD PRESSURE) 30 tablet 0  . atorvastatin (LIPITOR) 40 MG tablet Take 1 tablet (40 mg total) daily by mouth. 90 tablet 3  . busPIRone (BUSPAR) 10 MG tablet Take 1 tablet (10 mg total) by mouth 3 (three) times daily. 90 tablet 11  . cetirizine (QC ALL DAY ALLERGY) 10 MG tablet TAKE 1 TABLET EACH DAY. (Patient taking differently: Take 10 mg by mouth daily. ) 30 tablet 11  . citalopram (CELEXA) 20 MG tablet Take 1 tablet (20 mg total) daily by mouth. 90 tablet 0  . fluticasone (FLONASE) 50 MCG/ACT nasal spray Place 2 sprays into both nostrils 2 (two) times daily. 16 g 6  . gabapentin (NEURONTIN) 300 MG capsule TAKE (1) CAPSULE THREE TIMES DAILY. (Patient taking differently: Take 300 mg 3 (three) times daily by mouth. ) 90 capsule 3  . hydrochlorothiazide (HYDRODIURIL) 25 MG tablet TAKE 1 TABLET EACH DAY. (Patient taking differently: Take 25 mg daily by mouth. ) 90 tablet 0  . losartan (COZAAR) 100 MG tablet Take 1 tablet (100 mg total) daily by mouth. 90 tablet 3  . montelukast (SINGULAIR) 10 MG tablet TAKE 1 TABLET AT BEDTIME AS DIRECTED. (Patient taking differently: Take 10 mg at bedtime by mouth. AS DIRECTED) 30 tablet 0  . NON FORMULARY Uses a C-PAP at bedtime    . nystatin (MYCOSTATIN) 100000 UNIT/ML suspension Take 15 mLs 2 (two) times daily by mouth. SWISH FOR 30 SECONDS AND SPIT    . omeprazole (PRILOSEC) 20  MG capsule TAKE (1) CAPSULE DAILY. (Patient taking differently: Take 20 mg daily by mouth. ) 30 capsule 0  . SPIRIVA HANDIHALER 18 MCG inhalation capsule INHALE CONTENTS OF 1 CAPSULE DAILY. 30 capsule 0  . VENTOLIN HFA 108 (90 Base) MCG/ACT inhaler USE 1-2 PUFFS INTO LUNGS EVERY 4 HOURS AS NEEDED FOR WHEEZING OR SHORTNESS OF BREATH. 18 g 0  .  blood glucose meter kit and supplies KIT Dispense based on patient and insurance preference. Use up to four times daily as directed. (FOR ICD-9 250.00, 250.01). 1 each 0  . cephALEXin (KEFLEX) 500 MG capsule Take 1 capsule (500 mg total) 2 (two) times daily for 7 days by mouth. (Patient not taking: Reported on 12/12/2016) 14 capsule 0  . HYDROcodone-acetaminophen (NORCO) 10-325 MG tablet Take 1 tablet by mouth every 4 (four) hours as needed. (Patient not taking: Reported on 12/12/2016) 50 tablet 0  . Insulin Glargine (LANTUS SOLOSTAR) 100 UNIT/ML Solostar Pen Inject 5 Units every morning into the skin. If your fasting blood sugar in the morning is greater than 300, increase your lantus dose by ONE unit. 5 pen PRN  . Insulin Pen Needle 29G X 5MM MISC Check sugars TID before meals. 90 each 0  . mometasone-formoterol (DULERA) 200-5 MCG/ACT AERO Inhale 2 puffs into the lungs 2 (two) times daily. (Patient not taking: Reported on 12/12/2016) 8.8 g 0  . polyethylene glycol (MIRALAX) packet Take 17 g by mouth daily. (Patient not taking: Reported on 12/12/2016) 14 each 0  . [DISCONTINUED] sucralfate (CARAFATE) 1 G tablet Take 1 tablet (1 g total) by mouth daily. 30 tablet 6    Objective: BP 117/60   Pulse 81   Temp 98.6 F (37 C) (Oral)   Resp (!) 27   LMP 04/10/2006   SpO2 96%  Exam: General: obese, extremely animated and in pain from muscle cramping Eyes: EOMI ENTM: chapped lips Neck: FROM Cardiovascular: occasionally tachy but difficult to auscualtate given patient's body habitus and inability to remain still for exam Respiratory: CTAB, no wheezing, no  IWB Gastrointestinal: no TTP, difficult to auscultate bowel sounds given body habitus and inabilty to remain silent for exam MSK: was having significant cramping on exam, abdominal and lower leg bilaterally Derm: no lesions noted Neuro: grossly intact Psych: alert but too distracted with pain of cramping during exam for clear mental evaluation  Labs and Imaging: CBC BMET  Recent Labs  Lab 12/12/16 1459  WBC 11.3*  HGB 15.8*  HCT 43.0  PLT 330   Recent Labs  Lab 12/12/16 1832  NA 131*  K 3.6  CL 94*  CO2 20*  BUN 22*  CREATININE 2.24*  GLUCOSE 499*  CALCIUM 9.0      No results found.   Mary Coombe, MD 12/12/2016, 10:38 PM PGY-1, [place] Intern pager: [phone], text pages welcome  I have separately seen and examined the patient. I have discussed the findings and exam with Dr. Criss Rosales and agree with the above note.  My changes/additions are outlined in BLUE.   Mary Coombe, MD PGY-2 [facility]

## 2016-12-12 NOTE — Telephone Encounter (Signed)
[facility] calling to report abnormal labs from yesterday. Please see labs

## 2016-12-12 NOTE — Telephone Encounter (Signed)
I called and asked patient to come in to hospital, likely needs admission for insulin gtt

## 2016-12-12 NOTE — Telephone Encounter (Signed)
Patient wants it sent to [facility]. Pt # today is [phone]

## 2016-12-12 NOTE — ED Notes (Signed)
Family medicine informed of irregular, tachy heart rate/rhythm, new EKG obtained

## 2016-12-12 NOTE — ED Provider Notes (Signed)
[place] EMERGENCY DEPARTMENT Provider Note   CSN: [id number] Arrival date & time: 12/12/16  1436     History   Chief Complaint Chief Complaint  Patient presents with  . abnormal labs    HPI [name] is a 51 y.o. female.  HPI   51 year old female with history of diabetes, hypertension, substance abuse, obstructive sleep apnea, chronic kidney disease, hyperlipidemia, CHF presenting for evaluation of abnormal labs.  Pt was seen at [facility] yesterday for follow up of new diagnosis of diabetes.  She was initially seen 1 week ago when she has UTI sxs.  Also report 45lbs weight gain.  She was found to have a A1c of 9.3.  Her CBG in office is >600, and she was started on insulin.  There's evidence of chronic kidney disease. No specific treatment plan yet.  Patient was at home with insulin but is in the process of getting it from her pharmacy, she received a phone call today stating that she should come to the ER to be admitted for further management of her diabetes.  Aside from polyuria and polydipsia and generalized fatigue, patient denies having any active pain.  She does endorse a baseline shortness of breath which is not new.  No associated cough.  Denies any burning urination just frequency and urgency.  Does have a strong family history of diabetes along with diabetic complications  Patient is a half a pack a day smoker.  Past Medical History:  Diagnosis Date  . Arthritis 04-10-11   hips, shoulders, back  . Asthma   . Bipolar disorder ([place])   . Cancer ([place]) 1993   cervical, no treatment done, went away per pt  . Cervical dysplasia or atypia 04-10-11   '93- once dx.-got pregnant-no intervention, then postpartum, no dysplasia found  . CHF (congestive heart failure) ([place])    no cardiologist 2014 dx, none now  . 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 Pathology showed Condyloma, gluteal CONDYLOMA ACUMINATUM.   [name] Kitchen COPD  (chronic obstructive pulmonary disease) ([place])   . Depression   . Diabetes mellitus without complication ([place])   . Dyspnea    with actity - better since taking Singular  . Fibrocystic breast disease   . GERD (gastroesophageal reflux disease)   . Hyperlipidemia   . Hypertension   . Migraine headache    none recent  . Neuropathy   . Skin lesion 03/15/2011   In gluteal crease now s/p removal by Dr. [name] of General Surgery on 3/12. Path shows condyloma.     . Sleep apnea 04-10-11   uses cpap, pt does not know settings  . Trigger finger    left third    Patient Active Problem List   Diagnosis Date Noted  . Type 2 diabetes mellitus with complication, without long-term current use of insulin ([place]) 12/11/2016  . Yeast infection 12/11/2016  . CKD (chronic kidney disease) stage 3, GFR 30-59 ml/min (HCC) 12/11/2016  . Urge incontinence of urine 12/05/2016  . Need for immunization against influenza 11/25/2016  . Stenosing tenosynovitis of finger of left hand 06/28/2016  . Prediabetes 05/09/2015  . Substance abuse ([place]) 11/01/2013  . Chronic diastolic CHF (congestive heart failure) ([place]) 04/07/2012  . Allergic rhinitis 03/29/2011  . GERD 01/26/2010  . MIGRAINE HEADACHE 10/25/2009  . DYSFUNCTIONAL UTERINE BLEEDING 10/25/2009  . ALOPECIA 06/14/2009  . Hypertension 08/04/2008  . OBSTRUCTIVE SLEEP APNEA 02/03/2008  . FIBROCYSTIC BREAST DISEASE 10/28/2006  .  Hyperlipidemia 03/27/2006  . OBESITY, NOS 03/27/2006  . TOBACCO DEPENDENCE 03/27/2006  . Depression with anxiety 03/27/2006  . NEUROGENIC BLADDER 03/27/2006  . Dyspnea 03/27/2006    Past Surgical History:  Procedure Laterality Date  . ANAL FISTULECTOMY  04/17/2011   Procedure: FISTULECTOMY ANAL;  Surgeon: Imogene Burn. [name], MD;  Location: [facility] ORS;  Service: General;  Laterality: N/A;  Excision of Condyloma Gluteal Cleft   . BACK SURGERY  04-10-11   x5-Lumbar fusion-retained hardware.(Dr. Louanne Skye)  . BREAST CYST EXCISION     bilateral  breast, 3 cysts removed from each breast  . BREAST EXCISIONAL BIOPSY Right    x 3  . BREAST EXCISIONAL BIOPSY Left    x 3  . CARPAL TUNNEL RELEASE Bilateral   . Cervical biospy    . COLONOSCOPY WITH PROPOFOL N/A 07/06/2015   Procedure: COLONOSCOPY WITH PROPOFOL;  Surgeon: [name], MD;  Location: [facility] ENDOSCOPY;  Service: Endoscopy;  Laterality: N/A;  . DOPPLER ECHOCARDIOGRAPHY  04/06/2012   AT [place] 55-60%  . MULTIPLE TOOTH EXTRACTIONS    . NECK SURGERY  04-10-11   x3- cervical fusion with plating and screws-Dr. [name]  . TEE WITHOUT CARDIOVERSION N/A 04/06/2012   Procedure: TRANSESOPHAGEAL ECHOCARDIOGRAM (TEE);  Surgeon: [name], MD;  Location: [facility] ENDOSCOPY;  Service: Cardiovascular;  Laterality: N/A;  . TRIGGER FINGER RELEASE Right    middle finger  . TRIGGER FINGER RELEASE Left 06/28/2016   Procedure: RELEASE TRIGGER FINGER LEFT 3RD FINGER;  Surgeon: [name], MD;  Location: [place];  Service: Orthopedics;  Laterality: Left;  . TRIGGER FINGER RELEASE Right 06/12/2016   Procedure: RIGHT INDEX FINGER TRIGGER RELEASE;  Surgeon: [name], MD;  Location: [place];  Service: Orthopedics;  Laterality: Right;    OB History    No data available       Home Medications    Prior to Admission medications   Medication Sig Start Date End Date Taking? Authorizing Provider  amLODipine (NORVASC) 10 MG tablet TAKE 1 TABLET ONCE DAILY FOR BLOOD PRESSURE. 12/10/16   [name], MD  atorvastatin (LIPITOR) 40 MG tablet Take 1 tablet (40 mg total) daily by mouth. 12/11/16   [name], MD  blood glucose meter kit and supplies KIT Dispense based on patient and insurance preference. Use up to four times daily as directed. (FOR ICD-9 250.00, 250.01). 12/11/16   [name], MD  busPIRone (BUSPAR) 10 MG tablet Take 1 tablet (10 mg total) by mouth 3 (three) times daily. 05/03/16   Vivi Barrack, MD  cephALEXin (KEFLEX) 500 MG capsule Take 1 capsule (500 mg total) 2 (two) times  daily for 7 days by mouth. 12/05/16 12/12/16  [name], MD  cetirizine (QC ALL DAY ALLERGY) 10 MG tablet TAKE 1 TABLET EACH DAY. Patient taking differently: Take 10 mg by mouth daily.  11/30/15   Vivi Barrack, MD  citalopram (CELEXA) 20 MG tablet Take 1 tablet (20 mg total) daily by mouth. 12/10/16   [name], MD  Cyanocobalamin (VITAMIN B 12 PO) Take 1 tablet by mouth daily.    [provider]  fluticasone (FLONASE) 50 MCG/ACT nasal spray Place 2 sprays into both nostrils 2 (two) times daily. 05/20/16   Zenia Resides, MD  gabapentin (NEURONTIN) 300 MG capsule TAKE (1) CAPSULE THREE TIMES DAILY. 12/10/16   [name], MD  hydrochlorothiazide (HYDRODIURIL) 25 MG tablet TAKE 1 TABLET EACH DAY. 12/10/16   [name], MD  HYDROcodone-acetaminophen [facility]) [phone]  MG tablet Take 1 tablet by mouth every 4 (four) hours as needed. 10/07/16   Jessy Oto, MD  Insulin Glargine (LANTUS SOLOSTAR) 100 UNIT/ML Solostar Pen Inject 5 Units every morning into the skin. If your fasting blood sugar in the morning is greater than 300, increase your lantus dose by ONE unit. 12/11/16   [name], MD  Insulin Pen Needle 29G X 5MM MISC Check sugars TID before meals. 12/11/16   [name], MD  losartan (COZAAR) 100 MG tablet Take 1 tablet (100 mg total) daily by mouth. 12/11/16   [name], MD  mometasone-formoterol [facility]) 200-5 MCG/ACT AERO Inhale 2 puffs into the lungs 2 (two) times daily. Patient taking differently: Inhale 2 puffs into the lungs 2 (two) times daily as needed for wheezing or shortness of breath.  11/23/15   Vivi Barrack, MD  montelukast (SINGULAIR) 10 MG tablet TAKE 1 TABLET AT BEDTIME AS DIRECTED. 12/11/16   [name], MD  NON FORMULARY Uses a C-PAP at bedtime    [provider]  omeprazole (PRILOSEC) 20 MG capsule TAKE (1) CAPSULE DAILY. 12/11/16   [name], MD  polyethylene glycol [facility]) packet Take 17 g by mouth daily. 09/23/16   Caryl Ada  K, PA-C  SPIRIVA HANDIHALER 18 MCG inhalation capsule INHALE CONTENTS OF 1 CAPSULE DAILY. 11/25/16   [name], MD  VENTOLIN HFA 108 (90 Base) MCG/ACT inhaler USE 1-2 PUFFS INTO LUNGS EVERY 4 HOURS AS NEEDED FOR WHEEZING OR SHORTNESS OF BREATH. 11/29/16   [name], MD    Family History Family History  Problem Relation Age of Onset  . Stroke Father   . Cancer Maternal Aunt        breast    Social History Social History   Tobacco Use  . Smoking status: Current Every Day Smoker    Packs/day: 0.50    Years: 38.00    Pack years: 19.00    Types: Cigarettes    Start date: 01/28/1977  . Smokeless tobacco: Never Used  Substance Use Topics  . Alcohol use: No    Alcohol/week: 0.0 oz  . Drug use: Yes    Types: Marijuana, "Crack" cocaine    Comment: hx marijuana usemany years ago, Crack -none since 11/2015     Allergies   Methadone hcl; Levofloxacin; and Nicotine   Review of Systems Review of Systems  All other systems reviewed and are negative.    Physical Exam Updated Vital Signs BP (!) 156/83 (BP Location: Right Arm)   Pulse (!) 107   Temp 98.6 F (37 C) (Oral)   Resp 18   LMP 04/10/2006   SpO2 (!) 87%   Physical Exam  Constitutional: She is oriented to person, place, and time. She appears well-developed and well-nourished. No distress.  Moderately obese female resting in bed in no acute discomfort.  HENT:  Head: Atraumatic.  Eyes: Conjunctivae are normal.  Neck: Neck supple.  Cardiovascular: Normal rate and regular rhythm.  Pulmonary/Chest: Effort normal and breath sounds normal.  Abdominal: Soft. She exhibits no distension. There is no tenderness.  Musculoskeletal: Normal range of motion.  Neurological: She is alert and oriented to person, place, and time.  Skin: No rash noted.  Psychiatric: She has a normal mood and affect.  Nursing note and vitals reviewed.    ED Treatments / Results  Labs (all labs ordered are listed, but only abnormal results  are displayed) Labs Reviewed  BASIC METABOLIC PANEL - Abnormal; Notable for the following components:  Result Value   Sodium 119 (*)    Chloride 80 (*)    Glucose, Bld 972 (*)    BUN 23 (*)    Creatinine, Ser 2.33 (*)    Calcium 8.8 (*)    GFR calc non Af Amer 23 (*)    GFR calc Af Amer 26 (*)    All other components within normal limits  CBC - Abnormal; Notable for the following components:   WBC 11.3 (*)    RBC 5.42 (*)    Hemoglobin 15.8 (*)    MCHC 36.7 (*)    All other components within normal limits  CBG MONITORING, ED - Abnormal; Notable for the following components:   Glucose-Capillary >600 (*)    All other components within normal limits  URINALYSIS, ROUTINE W REFLEX MICROSCOPIC  CBG MONITORING, ED    EKG  EKG Interpretation None       Radiology No results found.  Procedures Procedures (including critical care time)  Medications Ordered in ED Medications - No data to display   Initial Impression / Assessment and Plan / ED Course  I have reviewed the triage vital signs and the nursing notes.  Pertinent labs & imaging results that were available during my care of the patient were reviewed by me and considered in my medical decision making (see chart for details).     BP (!) 110/91   Pulse 83   Temp 98.6 F (37 C) (Oral)   Resp (!) 27   LMP 04/10/2006   SpO2 97%    Final Clinical Impressions(s) / ED Diagnoses   Final diagnoses:  Hyperglycemia    ED Discharge Orders    None     4:11 PM This is patient who was diagnosed with diabetes recently with a blood sugar greater than 600.  She was recommended by family practice center to come for admission for further close management of her diabetes.  She does endorse polyuria and polydipsia and generalized fatigue which is consistent with her diabetes.  She denies any active pain.  4:52 PM Current CBG is 972, her anion gap is 13 therefore no evidence of DKA.  She has a sodium of 119 consistent  with pseudohyponatremia but after corrected it is 133.  She is currently receiving IV fluid and insulin via glucose stabilizer protocol.  Appreciate consultation from family medicine resident who agrees to see patient in the ER and was admitted for further management of her diabetic condition.  Care discussed with DR. Jeneen Rinks.    Domenic Moras, PA-C 12/12/16 1654    Gareth Morgan, MD 12/15/16 [phone]

## 2016-12-12 NOTE — ED Notes (Addendum)
Pt. Was not on monitor when going in to introduce myself. Pt. Agreed to be put back on monitor without any issue. Pt. HR was elevated and abnormal. MD made aware. Repeat EKG done and vital signs updated. Will continue to monitor.

## 2016-12-12 NOTE — Telephone Encounter (Signed)
Script for Blood glucose meter/kit has wrong code on it (per pharmacy).  The ICD 9 code is wrong and needs to be corrected and reprinted for patient. The patient is here. °

## 2016-12-12 NOTE — ED Notes (Addendum)
Pt. SPO2 desats to 88% when sleeping. Placed pt. On 4L nasal canula. SPO2 96% at this time. Will continue to monitor.

## 2016-12-12 NOTE — ED Notes (Addendum)
Pt. Sitting on side of bed eating a sandwich. Bed changed and pt. Was able to ambulate to bedside commode. CBG rechecked due to pt. Falling asleep. CBG elevated, MD paged.

## 2016-12-12 NOTE — ED Triage Notes (Signed)
To ED for eval of abnormal labwork. Pt was told yesterday that she is a diabetic. Labs drawn in office yesterday resulted today. She was called and told to come to ED ASAP. States she has a bladder infection, a yeast infection, and an infection in her mouth. Pt is not of oxygen at home - only cpap at night but noted low sats in triage. O2 2L [place] placed.

## 2016-12-12 NOTE — ED Notes (Signed)
Patient was diagnosed with diabetes yesterday. She states that she did not know she had it.  Pt complaining of frequent urination, dry mouth, yeast infection. Denies N/V/D.

## 2016-12-12 NOTE — ED Notes (Signed)
Paged admitting due to patient being anxious, fidgety, and wanting to stay standing.  Admitting doc is going to put orders in to hopefully help.  Will be collecting BMP to see what potassium is.

## 2016-12-12 NOTE — Progress Notes (Deleted)
   Subjective:    Patient ID: [name], female    DOB: 1966-01-22, 51 y.o.   MRN: [id number]   CC: Diabetes follow up  HPI: Newly diagnosed on 12/11/16 Fasting checks: Lantus 5u qAM (insulin naive), with instructions to increase by one unit each morning if blood sugars are >300. At that time recommended by Dr. Burr Medico for referral to renal for GFR <45.  Post prandial ***  Compliance: *** Diet: ***  Exercise: *** Eye exam: *** Foot exam: *** A1C: *** Symptoms: *** symptoms of hypoglycemia. *** symptoms of  polyuria, polydipsia. *** numbness in extremities, and *** foot ulcers/trauma Meds: ***   Smoking status reviewed  Review of Systems   Objective:  LMP 04/10/2006  Vitals and nursing note reviewed  General: well nourished, in no acute distress HEENT: normocephalic, TM's visualized bilaterally, no scleral icterus or conjunctival pallor, no nasal discharge, moist mucous membranes, good dentition without erythema or discharge noted in posterior oropharynx Neck: supple, non-tender, without lymphadenopathy Cardiac: RRR, clear S1 and S2, no murmurs, rubs, or gallops Respiratory: clear to auscultation bilaterally, no increased work of breathing Abdomen: soft, nontender, nondistended, no masses or organomegaly. Bowel sounds present Extremities: no edema or cyanosis. Warm, well perfused. 2+ radial and PT pulses bilaterally Skin: warm and dry, no rashes noted Neuro: alert and oriented, no focal deficits   Assessment & Plan:    No problem-specific Assessment & Plan notes found for this encounter.    No Follow-up on file.   Caroline More, DO, PGY-1

## 2016-12-13 ENCOUNTER — Ambulatory Visit: Payer: Medicare Other | Admitting: Family Medicine

## 2016-12-13 DIAGNOSIS — E876 Hypokalemia: Secondary | ICD-10-CM | POA: Diagnosis not present

## 2016-12-13 DIAGNOSIS — E1100 Type 2 diabetes mellitus with hyperosmolarity without nonketotic hyperglycemic-hyperosmolar coma (NKHHC): Secondary | ICD-10-CM | POA: Diagnosis not present

## 2016-12-13 DIAGNOSIS — N179 Acute kidney failure, unspecified: Secondary | ICD-10-CM

## 2016-12-13 DIAGNOSIS — L8990 Pressure ulcer of unspecified site, unspecified stage: Secondary | ICD-10-CM

## 2016-12-13 LAB — BASIC METABOLIC PANEL
ANION GAP: 13 (ref 5–15)
Anion gap: 10 (ref 5–15)
BUN: 18 mg/dL (ref 6–20)
BUN: 15 mg/dL (ref 6–20)
CALCIUM: 8.6 mg/dL — AB (ref 8.9–10.3)
CALCIUM: 8.6 mg/dL — AB (ref 8.9–10.3)
CHLORIDE: 98 mmol/L — AB (ref 101–111)
CHLORIDE: 101 mmol/L (ref 101–111)
CO2: 22 mmol/L (ref 22–32)
CO2: 21 mmol/L — ABNORMAL LOW (ref 22–32)
CREATININE: 1.62 mg/dL — AB (ref 0.44–1.00)
CREATININE: 1.42 mg/dL — AB (ref 0.44–1.00)
GFR calc Af Amer: 48 mL/min — ABNORMAL LOW (ref >60)
GFR calc non Af Amer: 41 mL/min — ABNORMAL LOW (ref >60)
GFR, EST AFRICAN AMERICAN: 41 mL/min — AB (ref >60)
GFR, EST NON AFRICAN AMERICAN: 35 mL/min — AB (ref >60)
Glucose, Bld: 283 mg/dL — ABNORMAL HIGH (ref 65–99)
Glucose, Bld: 316 mg/dL — ABNORMAL HIGH (ref 65–99)
POTASSIUM: 3.8 mmol/L (ref 3.5–5.1)
Potassium: 4.6 mmol/L (ref 3.5–5.1)
SODIUM: 133 mmol/L — AB (ref 135–145)
SODIUM: 132 mmol/L — AB (ref 135–145)

## 2016-12-13 LAB — CBG MONITORING, ED
GLUCOSE-CAPILLARY: 348 mg/dL — AB (ref 65–99)
Glucose-Capillary: 368 mg/dL — ABNORMAL HIGH (ref 65–99)

## 2016-12-13 LAB — GLUCOSE, CAPILLARY
GLUCOSE-CAPILLARY: 391 mg/dL — AB (ref 65–99)
Glucose-Capillary: 305 mg/dL — ABNORMAL HIGH (ref 65–99)
Glucose-Capillary: 281 mg/dL — ABNORMAL HIGH (ref 65–99)

## 2016-12-13 LAB — HIV ANTIBODY (ROUTINE TESTING W REFLEX): HIV SCREEN 4TH GENERATION: NONREACTIVE

## 2016-12-13 MED ORDER — INSULIN ASPART 100 UNIT/ML ~~LOC~~ SOLN: 0.0000 [IU] | Freq: Every day | SUBCUTANEOUS | Status: DC

## 2016-12-13 MED ORDER — LIVING WELL WITH DIABETES BOOK
Freq: Once | Status: DC
  Filled 2016-12-13: qty 1

## 2016-12-13 MED ORDER — NYSTATIN 100000 UNIT/GM EX CREA
TOPICAL_CREAM | Freq: Two times a day (BID) | CUTANEOUS | Status: DC
  Administered 2016-12-13: 11:00:00 via TOPICAL
  Filled 2016-12-13: qty 15

## 2016-12-13 MED ORDER — INSULIN ASPART 100 UNIT/ML ~~LOC~~ SOLN
0.0000 [IU] | Freq: Three times a day (TID) | SUBCUTANEOUS | Status: DC
  Administered 2016-12-13: 8 [IU] via SUBCUTANEOUS
  Administered 2016-12-13: 15 [IU] via SUBCUTANEOUS

## 2016-12-13 MED ORDER — INSULIN GLARGINE 100 UNIT/ML SOLOSTAR PEN: 16.0000 [IU] | PEN_INJECTOR | SUBCUTANEOUS | 99 refills | Status: DC

## 2016-12-13 MED ORDER — LIVING WELL WITH DIABETES BOOK: 1.0000 | Freq: Once | 0 refills | Status: AC

## 2016-12-13 MED ORDER — POTASSIUM CHLORIDE CRYS ER 20 MEQ PO TBCR
40.0000 meq | EXTENDED_RELEASE_TABLET | Freq: Once | ORAL | Status: AC
  Administered 2016-12-13: 40 meq via ORAL
  Filled 2016-12-13: qty 2

## 2016-12-13 MED ORDER — ORAL CARE MOUTH RINSE
15.0000 mL | Freq: Two times a day (BID) | OROMUCOSAL | Status: DC
  Administered 2016-12-13: 15 mL via OROMUCOSAL

## 2016-12-13 NOTE — H&P (Signed)
Family Medicine Teaching Service Daily Progress Note Intern Pager: [phone]  Patient name: [name] Medical record number: [id number] Date of birth: 16-Apr-1965 Age: 51 y.o. Gender: female  Primary Care Provider: [name], MD Consultants: None Code Status: Full  Pt Overview and Major Events to Date:  Patient met criteria for HHS with BG >900 and is a newly diagnosed T2DM. She is s/p insulin gtt with CBG in the 200-300s.   Assessment and Plan:  New Onset T2DM/HHS - Patient had CBGs in the 900s on admission and was started on insulin gtt. BG has now come down to 190-300s range.  - Increase home Lantus dose to 16U - Receive further DM education before discharge - Discontinue D5NS - CBG q4  CKD- Stage 4 on admission, stage 3 chronic, unsure if new baseline or acute on chronic  -avoid nephrotoxic medications -patient will need initial nephro referal on d/c -held gabapentin/losartin/hctz  HLD- uncontrolled as of 7 months ago in clinic -home atorvastatin 40 -repeat lipid panel  HTN -fluctating BP during admission from 150s-100 sbp -hold home BP meds temp to avoid hypotension  History of substance abuse (crack)  -avoid B-blockers   HFpEF 55-60%- from echo 6 months ago, no current SOB/edema -will monitor for fluid overload given significant rehydration involved with HHS protocol  COPD- no current SOB complaints -home ventolin -home singulair -home spiriva  FEN/GI: Diabetic diet, pantoprazole PPx: lovenox  Disposition: Discharge home, medically stable  Subjective:  Ms Harshman states she is doing well this morning and her abdominal cramping has resolved. She has no complaints, feels well and would like to go home today.  Objective: Temp:  [98.6 F (37 C)-98.9 F (37.2 C)] 98.9 F (37.2 C) (11/16 0458) Pulse Rate:  [74-110] 105 (11/16 0458) Resp:  [10-27] 20 (11/16 0458) BP: (99-156)/(52-92) 110/71 (11/16 0458) SpO2:  [87 %-98 %] 90 % (11/16  0458) Physical Exam: Gen: Alert and Oriented x 3, NAD CV: RRR, no murmurs, normal S1, S2 split, +2 pulses dorsalis pedis bilaterally Resp: CTAB, no crackles, mild end-expiratory wheezing Abd: obese abdomen, non-tender, soft, +bs in all four quadrants, no hepatosplenomegaly Ext: no clubbing, cyanosis, left lower extremity trace edema Skin: warm, dry, intact, no rashes   Laboratory: Recent Labs  Lab 12/12/16 1459  WBC 11.3*  HGB 15.8*  HCT 43.0  PLT 330   Recent Labs  Lab 12/12/16 2248 12/13/16 0504 12/13/16 1006  NA 134* 133* 132*  K 3.5 3.8 4.6  CL 96* 98* 101  CO2 27 22 21*  BUN _0 CREATININE 1.84* 1.62* 1.42*  CALCIUM 8.8* 8.6* 8.6*  GLUCOSE 188* 283* 316*    Imaging/Diagnostic Tests:  None to date  Nuala Alpha, DO 12/13/2016, 7:16 AM PGY-1, [place] Intern pager: [phone], text pages welcome

## 2016-12-13 NOTE — ED Notes (Signed)
MD intern paged about every hour blood sugar checks. Was told order changed to every 4 hours. Will continue to monitor.

## 2016-12-13 NOTE — Progress Notes (Signed)
Inpatient Diabetes Program Recommendations  AACE/ADA: New Consensus Statement on Inpatient Glycemic Control (2015)  Target Ranges:  Prepandial:   less than 140 mg/dL      Peak postprandial:   less than 180 mg/dL (1-2 hours)      Critically ill patients:  140 - 180 mg/dL   Lab Results  Component Value Date   GLUCAP 391 (H) 12/13/2016   HGBA1C 9.3 12/05/2016    Review of Glycemic Control  Diabetes history: New diagnosis Outpatient Diabetes medications: None Current orders for Inpatient glycemic control: Novolog 0-15 units tidwc and hs, Lantus 10 units QHS  Pt states she received diabetes education at MD office visit. Knows how to give herself insulin via insulin pen. Discussed hypoglycemia s/s and treatment. Discussed glucose monitoring 3-4x/day and take logbook/meter to MD appt. Will order Living Well book. Discussed how diet, exercise, and stress management and how they affect blood sugars. Pt asks good questions and seems to be motivated to control her blood sugars. Discussed HgbA1C of 9.3% and goals.  Thank you. Lorenda Peck, RD, LDN, CDE Inpatient Diabetes Coordinator [phone]

## 2016-12-13 NOTE — Progress Notes (Signed)
FPTS Interim Progress Note  CBG came down below 200. Patient taken off of insulin gtt. Patient given meal. Gave Lantus 10u. Continue following CBGs and will likely have to keep giving short-acting insulin overnight to prevent her from having to go back on the insulin gtt. Still awaiting BMET results, ordered q2H and last one resulted at 6:30 PM, spoke with lab.   Please page our resident team pager for future questions [phone]. Appreciate excellent nursing care.  [name], MD 12/13/2016, 12:14 AM PGY-2, [place] Medicine Service pager [phone]

## 2016-12-13 NOTE — Discharge Summary (Signed)
[facility] Discharge Summary  Patient name: [name] Medical record number: [id number] Date of birth: 17-Mar-1965 Age: 51 y.o. Gender: female Date of Admission: 12/12/2016  Date of Discharge: 12/13/2016 Admitting Physician: Zenia Resides, MD  Primary Care Provider: [name], MD Consultants: None  Indication for Hospitalization:   T2DM with Hyperosmolar hyperglycemic syndrome  Discharge Diagnoses/Problem List:  T2DM with Hyperosmolar Hyperglycemic Syndrome Hypokalemia Acute Kidney Injury Hyperlipidemia HTN Depression Hx of substance abuse Obesity  Disposition: medically stable for discharge to self-care to home  Discharge Condition:   Discharge Exam:   Gen: Alert and Oriented x 3, NAD CV: RRR, no murmurs, normal S1, S2 split, +2 pulses dorsalis pedis bilaterally Resp: CTAB, no wheezing, rales, or rhonchi, comfortable work of breathing Abd: obese abdomen, non-distended, non-tender, soft, +bs in all four quadrants, no hepatomegaly MSK: FROM in all four extremities Ext: no clubbing, cyanosis, or edema Neuro: CN II-XII intact, no gross deficits Skin: warm, dry, intact, no rashes  Brief Hospital Course:  [name] was told by her PCP at the [facility] to go to the ED to be admitted due to her blood glucose being >900 after labs were drawn in the office. She was recently diagnosed with T2DM and had not started insulin therapy yet. She was admitted and given IVFs and started on insulin gtt. After several hours of insulin gtt and several boluses of NS her blood glucose was below 300 and she was transitioned to home insulin regimen. She was given diabetic education in the office and in the hospital and expressed understanding and agreement with checking her blood sugars every day in the morning and before meals and feels comfortable to administer her insulin on her own.  Issues for Follow Up:  1. Patient will need close  follow up as she is a new T2DM. She should be encouraged to bring in a log of her home blood glucose checks to ensure she is getting adequate insulin therapy. Currently on 16U of Lantus. Some adjustment is to be expected since this is a new diagnosis. 2. Consider nutrition consult in office due to recent 45lb weight gain within 1 year. 3. Repeat her fasting lipid panel as she had uncontrolled hyperlipidemia 7 months ago. Now on Atorvastatin 32m.  Significant Procedures:   None  Significant Labs and Imaging:  Recent Labs  Lab 12/12/16 1459  WBC 11.3*  HGB 15.8*  HCT 43.0  PLT 330   Recent Labs  Lab 12/12/16 1459 12/12/16 1832 12/12/16 2248 12/13/16 0504 12/13/16 1006  NA 119* 131* 134* 133* 132*  K 4.5 3.6 3.5 3.8 4.6  CL 80* 94* 96* 98* 101  CO2 26 20* 27 22 21*  GLUCOSE 972* 499* 188* 283* 316*  BUN 23* 22* '20 18 15  ' CREATININE 2.33* 2.24* 1.84* 1.62* 1.42*  CALCIUM 8.8* 9.0 8.8* 8.6* 8.6*  MG  --  2.1  --   --   --   PHOS  --  3.5  --   --   --    11/14: U/A: >500 glucose, otherwise wnl 11/14: HIV: non-reactive  Results/Tests Pending at Time of Discharge:   None  Discharge Medications:  Allergies as of 12/13/2016      Reactions   Methadone Hcl Other (See Comments)    "blacked out" in 1990's   Levofloxacin Itching   Nicotine Rash, Other (See Comments)   Patch caused a rash      Medication List  STOP taking these medications   cephALEXin 500 MG capsule Commonly known as:  KEFLEX   HYDROcodone-acetaminophen 10-325 MG tablet Commonly known as:  NORCO     TAKE these medications   amLODipine 10 MG tablet Commonly known as:  NORVASC TAKE 1 TABLET ONCE DAILY FOR BLOOD PRESSURE. What changed:    how much to take  when to take this  additional instructions   atorvastatin 40 MG tablet Commonly known as:  LIPITOR Take 1 tablet (40 mg total) daily by mouth.   blood glucose meter kit and supplies Kit Dispense based on patient and insurance  preference. Use up to four times daily as directed. (FOR ICD-9 250.00, 250.01).   busPIRone 10 MG tablet Commonly known as:  BUSPAR Take 1 tablet (10 mg total) by mouth 3 (three) times daily.   cetirizine 10 MG tablet Commonly known as:  QC ALL DAY ALLERGY TAKE 1 TABLET EACH DAY. What changed:    how much to take  how to take this  when to take this  additional instructions   citalopram 20 MG tablet Commonly known as:  CELEXA Take 1 tablet (20 mg total) daily by mouth.   fluticasone 50 MCG/ACT nasal spray Commonly known as:  FLONASE Place 2 sprays into both nostrils 2 (two) times daily.   gabapentin 300 MG capsule Commonly known as:  NEURONTIN TAKE (1) CAPSULE THREE TIMES DAILY. What changed:    how much to take  how to take this  when to take this  additional instructions   hydrochlorothiazide 25 MG tablet Commonly known as:  HYDRODIURIL TAKE 1 TABLET EACH DAY. What changed:    how much to take  how to take this  when to take this  additional instructions   Insulin Glargine 100 UNIT/ML Solostar Pen Commonly known as:  LANTUS SOLOSTAR Inject 16 Units every morning into the skin. If your fasting blood sugar in the morning is greater than 300, increase your lantus dose by ONE unit. What changed:  how much to take   Insulin Pen Needle 29G X 5MM Misc Check sugars TID before meals.   living well with diabetes book Misc 1 each once for 1 dose by Does not apply route.   losartan 100 MG tablet Commonly known as:  COZAAR Take 1 tablet (100 mg total) daily by mouth.   mometasone-formoterol 200-5 MCG/ACT Aero Commonly known as:  DULERA Inhale 2 puffs into the lungs 2 (two) times daily.   montelukast 10 MG tablet Commonly known as:  SINGULAIR TAKE 1 TABLET AT BEDTIME AS DIRECTED. What changed:    how much to take  how to take this  when to take this  additional instructions   NON FORMULARY Uses a C-PAP at bedtime   nystatin 100000 UNIT/ML  suspension Commonly known as:  MYCOSTATIN Take 15 mLs 2 (two) times daily by mouth. SWISH FOR 30 SECONDS AND SPIT   omeprazole 20 MG capsule Commonly known as:  PRILOSEC TAKE (1) CAPSULE DAILY. What changed:    how much to take  how to take this  when to take this  additional instructions   polyethylene glycol packet Commonly known as:  MIRALAX Take 17 g by mouth daily.   SPIRIVA HANDIHALER 18 MCG inhalation capsule Generic drug:  tiotropium INHALE CONTENTS OF 1 CAPSULE DAILY.   VENTOLIN HFA 108 (90 Base) MCG/ACT inhaler Generic drug:  albuterol USE 1-2 PUFFS INTO LUNGS EVERY 4 HOURS AS NEEDED FOR WHEEZING OR SHORTNESS OF BREATH.  Discharge Instructions: Please refer to Patient Instructions section of EMR for full details.  Patient was counseled important signs and symptoms that should prompt return to medical care, changes in medications, dietary instructions, activity restrictions, and follow up appointments.   Follow-Up Appointments: Follow-up Information    Mercy Riding, MD. Go on 12/18/2016.   Specialty:  Family Medicine Why:  Please arrive at 8:50am for your 9:10am appointment with Dr. Cyndia Skeeters. Contact information: [place] [address] [phone]           Nuala Alpha, DO 12/13/2016, 2:26 PM PGY-1, [place]

## 2016-12-13 NOTE — Progress Notes (Signed)
New Admission Note:  Arrival Method: Stretcher from ED with NT Mental Orientation: A&O x4 Telemetry: Box 19, CCMD notified Assessment: Completed Skin: Assessed with Kami Moore,RN,  check flowsheets IV: L AC, Normal saline infusing @125  ml/hr Pain: 0/10 Tubes: N/A Safety Measures: Safety Fall Prevention Plan discussed with patient. Admission: Completed 2 [place] Orientation: Patient has been orientated to the room, unit and the staff. Family: None at bedside  Orders have been reviewed and implemented. Will continue to monitor the patient. Call light has been placed within reach and bed alarm has been activated. Yellow non skid socks and yellow arm band applied.   Nena Polio BSN, RN  Phone Number: [phone]

## 2016-12-13 NOTE — Plan of Care (Signed)
  RD consulted for nutrition education regarding newly diagnosed diabetes.   Lab Results  Component Value Date   HGBA1C 9.3 12/05/2016    RD provided "Carbohydrate Counting for People with Diabetes" and "Diabetes Label Reading Tips" handouts from the Academy of Nutrition and Dietetics.   Discussed different food groups and their effects on blood sugar, emphasizing carbohydrate-containing foods. Provided list of carbohydrates and recommended serving sizes of common foods. Pt reports she has been limiting dairy products already and consumes a lot of rice but has been monitoring intake by changing serving bowl.   Discussed importance of controlled and consistent carbohydrate intake throughout the day. Pt reports she is in the car often and skips meals. RD provided examples of ways to balance meals/snacks and encouraged intake of high-fiber, whole grain complex carbohydrates. RD provided snack/meal options that are transportable and can be eaten on the run. Teach back method used.  Expect fair compliance. Pt already making changes to lifestyle and expresses willingness to change.  There is no height or weight on file to calculate BMI. Pt reports recent intentional weight loss of 6 lbs.   Current diet order is carb modified, patient is consuming approximately 100% of meals at this time.   Labs and medications reviewed. No further nutrition interventions warranted at this time. RD contact information provided. If additional nutrition issues arise, please re-consult RD.  Parks Ranger, MS, RDN, LDN 12/13/2016 11:29 AM

## 2016-12-13 NOTE — Discharge Instructions (Signed)
You were recently diagnosed with Type 2 Diabetes Mellitus and we are starting you on 16U of Lantus Insulin. It will be important for you to check your blood sugar in the morning and before meals. Please bring your prescribed medications with you to your follow up appointment at the [facility].

## 2016-12-13 NOTE — Care Management CC44 (Signed)
Condition Code 44 Documentation Completed  Patient Details  Name: [name] MRN: [id number] Date of Birth: Nov 06, 1965   Condition Code 44 given:  Yes Patient signature on Condition Code 44 notice:  Yes Documentation of 2 MD's agreement:  Yes Code 44 added to claim:  Yes    RoyalRory Percy, RN 12/13/2016, 2:37 PM

## 2016-12-13 NOTE — Care Management Obs Status (Signed)
[place] NOTIFICATION   Patient Details  Name: [name] MRN: [id number] Date of Birth: 08/12/1965   Medicare Observation Status Notification Given:  Yes    Kenny Stern, Rory Percy, RN 12/13/2016, 2:37 PM

## 2016-12-13 NOTE — Telephone Encounter (Signed)
Pt called because the ICD-9 code is wrong on he prescription so they will not fill the medication.Can we get a new prescription called in with the correct code. She would like someone to call her so that she doesn't stay all nervous and end up back in the hospital.jw

## 2016-12-13 NOTE — Care Management Note (Addendum)
Case Management Note  Patient Details  Name: BANA BORGMEYER MRN: [id number] Date of Birth: 01/24/1966  Subjective/Objective:       CM following for progression and d/c planning. Pt with hx CPap at night. Adm from MD office with CBG >600. New diabetes diagnosis. Nasal oxygen while in the hospital.             Action/Plan: 12/13/2016 Able to wean from oxygen, pt changed to OBS status. Code 44 given to pt and explained in detail. Pt verbalized understanding. Plan for d/c to home today.   Expected Discharge Date:  12/13/16               Expected Discharge Plan:  Home/Self Care  In-House Referral:  NA  Discharge planning Services  NA  Post Acute Care Choice:  NA Choice offered to:  NA  DME Arranged:  N/A DME Agency:  NA  HH Arranged:  NA HH Agency:  NA  Status of Service:  Completed, signed off  If discussed at [place] of Stay Meetings, dates discussed:    Additional Comments:  Adron Bene, RN 12/13/2016, 2:53 PM

## 2016-12-15 ENCOUNTER — Other Ambulatory Visit: Payer: Self-pay | Admitting: Student in an Organized Health Care Education/Training Program

## 2016-12-15 DIAGNOSIS — E118 Type 2 diabetes mellitus with unspecified complications: Secondary | ICD-10-CM

## 2016-12-15 MED ORDER — INSULIN GLARGINE 100 UNIT/ML SOLOSTAR PEN: 16.0000 [IU] | PEN_INJECTOR | SUBCUTANEOUS | 99 refills | Status: DC

## 2016-12-15 NOTE — Telephone Encounter (Signed)
Insulin script resent. Please call and inform patient and have her call back if there are any difficulties obtaining insulin.

## 2016-12-16 ENCOUNTER — Emergency Department (HOSPITAL_COMMUNITY): Payer: Medicare Other

## 2016-12-16 ENCOUNTER — Inpatient Hospital Stay (HOSPITAL_COMMUNITY)
Admission: EM | Admit: 2016-12-16 | Discharge: 2016-12-20 | DRG: 464 | Disposition: A | Discharge status: Home Health | Payer: Medicare Other | Attending: Family Medicine | Admitting: Family Medicine

## 2016-12-16 ENCOUNTER — Encounter (HOSPITAL_COMMUNITY): Payer: Self-pay | Admitting: Emergency Medicine

## 2016-12-16 ENCOUNTER — Inpatient Hospital Stay (HOSPITAL_COMMUNITY): Payer: Medicare Other

## 2016-12-16 DIAGNOSIS — N3941 Urge incontinence: Secondary | ICD-10-CM | POA: Diagnosis present

## 2016-12-16 DIAGNOSIS — F1411 Cocaine abuse, in remission: Secondary | ICD-10-CM | POA: Diagnosis present

## 2016-12-16 DIAGNOSIS — K219 Gastro-esophageal reflux disease without esophagitis: Secondary | ICD-10-CM | POA: Diagnosis present

## 2016-12-16 DIAGNOSIS — Z6841 Body Mass Index (BMI) 40.0 and over, adult: Secondary | ICD-10-CM

## 2016-12-16 DIAGNOSIS — E1165 Type 2 diabetes mellitus with hyperglycemia: Secondary | ICD-10-CM | POA: Diagnosis present

## 2016-12-16 DIAGNOSIS — Y9301 Activity, walking, marching and hiking: Secondary | ICD-10-CM | POA: Diagnosis present

## 2016-12-16 DIAGNOSIS — N319 Neuromuscular dysfunction of bladder, unspecified: Secondary | ICD-10-CM | POA: Diagnosis present

## 2016-12-16 DIAGNOSIS — M1990 Unspecified osteoarthritis, unspecified site: Secondary | ICD-10-CM | POA: Diagnosis present

## 2016-12-16 DIAGNOSIS — E11649 Type 2 diabetes mellitus with hypoglycemia without coma: Secondary | ICD-10-CM | POA: Diagnosis not present

## 2016-12-16 DIAGNOSIS — G4733 Obstructive sleep apnea (adult) (pediatric): Secondary | ICD-10-CM | POA: Diagnosis present

## 2016-12-16 DIAGNOSIS — S92342A Displaced fracture of fourth metatarsal bone, left foot, initial encounter for closed fracture: Secondary | ICD-10-CM | POA: Diagnosis present

## 2016-12-16 DIAGNOSIS — E662 Morbid (severe) obesity with alveolar hypoventilation: Secondary | ICD-10-CM | POA: Diagnosis present

## 2016-12-16 DIAGNOSIS — S91312A Laceration without foreign body, left foot, initial encounter: Secondary | ICD-10-CM | POA: Insufficient documentation

## 2016-12-16 DIAGNOSIS — I130 Hypertensive heart and chronic kidney disease with heart failure and stage 1 through stage 4 chronic kidney disease, or unspecified chronic kidney disease: Secondary | ICD-10-CM | POA: Diagnosis present

## 2016-12-16 DIAGNOSIS — N179 Acute kidney failure, unspecified: Secondary | ICD-10-CM | POA: Diagnosis present

## 2016-12-16 DIAGNOSIS — W010XXA Fall on same level from slipping, tripping and stumbling without subsequent striking against object, initial encounter: Secondary | ICD-10-CM | POA: Diagnosis present

## 2016-12-16 DIAGNOSIS — N183 Chronic kidney disease, stage 3 unspecified: Secondary | ICD-10-CM

## 2016-12-16 DIAGNOSIS — W19XXXA Unspecified fall, initial encounter: Secondary | ICD-10-CM

## 2016-12-16 DIAGNOSIS — E860 Dehydration: Secondary | ICD-10-CM | POA: Diagnosis present

## 2016-12-16 DIAGNOSIS — F418 Other specified anxiety disorders: Secondary | ICD-10-CM | POA: Diagnosis present

## 2016-12-16 DIAGNOSIS — Z794 Long term (current) use of insulin: Secondary | ICD-10-CM

## 2016-12-16 DIAGNOSIS — E872 Acidosis: Secondary | ICD-10-CM | POA: Diagnosis not present

## 2016-12-16 DIAGNOSIS — Y92002 Bathroom of unspecified non-institutional (private) residence single-family (private) house as the place of occurrence of the external cause: Secondary | ICD-10-CM | POA: Diagnosis not present

## 2016-12-16 DIAGNOSIS — Z79899 Other long term (current) drug therapy: Secondary | ICD-10-CM

## 2016-12-16 DIAGNOSIS — E1140 Type 2 diabetes mellitus with diabetic neuropathy, unspecified: Secondary | ICD-10-CM | POA: Diagnosis present

## 2016-12-16 DIAGNOSIS — E785 Hyperlipidemia, unspecified: Secondary | ICD-10-CM | POA: Diagnosis present

## 2016-12-16 DIAGNOSIS — F319 Bipolar disorder, unspecified: Secondary | ICD-10-CM | POA: Diagnosis present

## 2016-12-16 DIAGNOSIS — S92902A Unspecified fracture of left foot, initial encounter for closed fracture: Secondary | ICD-10-CM | POA: Diagnosis present

## 2016-12-16 DIAGNOSIS — R739 Hyperglycemia, unspecified: Secondary | ICD-10-CM | POA: Diagnosis not present

## 2016-12-16 DIAGNOSIS — S92502B Displaced unspecified fracture of left lesser toe(s), initial encounter for open fracture: Secondary | ICD-10-CM

## 2016-12-16 DIAGNOSIS — S92332A Displaced fracture of third metatarsal bone, left foot, initial encounter for closed fracture: Secondary | ICD-10-CM | POA: Diagnosis present

## 2016-12-16 DIAGNOSIS — E8729 Other acidosis: Secondary | ICD-10-CM | POA: Insufficient documentation

## 2016-12-16 DIAGNOSIS — S92322A Displaced fracture of second metatarsal bone, left foot, initial encounter for closed fracture: Secondary | ICD-10-CM | POA: Diagnosis present

## 2016-12-16 DIAGNOSIS — E1122 Type 2 diabetes mellitus with diabetic chronic kidney disease: Secondary | ICD-10-CM | POA: Diagnosis present

## 2016-12-16 DIAGNOSIS — J449 Chronic obstructive pulmonary disease, unspecified: Secondary | ICD-10-CM | POA: Diagnosis present

## 2016-12-16 DIAGNOSIS — E871 Hypo-osmolality and hyponatremia: Secondary | ICD-10-CM | POA: Diagnosis not present

## 2016-12-16 DIAGNOSIS — M79672 Pain in left foot: Secondary | ICD-10-CM | POA: Diagnosis not present

## 2016-12-16 DIAGNOSIS — I5032 Chronic diastolic (congestive) heart failure: Secondary | ICD-10-CM | POA: Diagnosis present

## 2016-12-16 DIAGNOSIS — S91302A Unspecified open wound, left foot, initial encounter: Secondary | ICD-10-CM | POA: Diagnosis not present

## 2016-12-16 DIAGNOSIS — Z87410 Personal history of cervical dysplasia: Secondary | ICD-10-CM

## 2016-12-16 DIAGNOSIS — S92902D Unspecified fracture of left foot, subsequent encounter for fracture with routine healing: Secondary | ICD-10-CM | POA: Diagnosis not present

## 2016-12-16 DIAGNOSIS — R05 Cough: Secondary | ICD-10-CM | POA: Diagnosis not present

## 2016-12-16 DIAGNOSIS — E870 Hyperosmolality and hypernatremia: Secondary | ICD-10-CM | POA: Diagnosis present

## 2016-12-16 DIAGNOSIS — I7389 Other specified peripheral vascular diseases: Secondary | ICD-10-CM | POA: Diagnosis not present

## 2016-12-16 DIAGNOSIS — S92352B Displaced fracture of fifth metatarsal bone, left foot, initial encounter for open fracture: Secondary | ICD-10-CM | POA: Diagnosis not present

## 2016-12-16 DIAGNOSIS — S92511B Displaced fracture of proximal phalanx of right lesser toe(s), initial encounter for open fracture: Secondary | ICD-10-CM | POA: Diagnosis not present

## 2016-12-16 DIAGNOSIS — F17210 Nicotine dependence, cigarettes, uncomplicated: Secondary | ICD-10-CM | POA: Diagnosis present

## 2016-12-16 DIAGNOSIS — N6019 Diffuse cystic mastopathy of unspecified breast: Secondary | ICD-10-CM | POA: Diagnosis present

## 2016-12-16 DIAGNOSIS — B370 Candidal stomatitis: Secondary | ICD-10-CM | POA: Diagnosis present

## 2016-12-16 DIAGNOSIS — G43909 Migraine, unspecified, not intractable, without status migrainosus: Secondary | ICD-10-CM | POA: Diagnosis present

## 2016-12-16 HISTORY — DX: Unspecified fall, initial encounter: W19.XXXA

## 2016-12-16 HISTORY — DX: Hypercalcemia: E83.52

## 2016-12-16 HISTORY — DX: Morbid (severe) obesity due to excess calories: E66.01

## 2016-12-16 LAB — CBC WITH DIFFERENTIAL/PLATELET
Basophils Absolute: 0 10*3/uL (ref 0.0–0.1)
Basophils Relative: 0 %
Eosinophils Absolute: 0.2 10*3/uL (ref 0.0–0.7)
Eosinophils Relative: 1 %
HCT: 46.7 % — ABNORMAL HIGH (ref 36.0–46.0)
Hemoglobin: 17.2 g/dL — ABNORMAL HIGH (ref 12.0–15.0)
Lymphocytes Relative: 23 %
Lymphs Abs: 3.7 10*3/uL (ref 0.7–4.0)
MCH: 29.1 pg (ref 26.0–34.0)
MCHC: 36.8 g/dL — ABNORMAL HIGH (ref 30.0–36.0)
MCV: 78.9 fL (ref 78.0–100.0)
Monocytes Absolute: 1.1 10*3/uL — ABNORMAL HIGH (ref 0.1–1.0)
Monocytes Relative: 7 %
Neutro Abs: 11.2 10*3/uL — ABNORMAL HIGH (ref 1.7–7.7)
Neutrophils Relative %: 69 %
Platelets: 322 10*3/uL (ref 150–400)
RBC: 5.92 MIL/uL — ABNORMAL HIGH (ref 3.87–5.11)
RDW: 13.3 % (ref 11.5–15.5)
WBC: 16.2 10*3/uL — ABNORMAL HIGH (ref 4.0–10.5)

## 2016-12-16 LAB — BASIC METABOLIC PANEL
Anion gap: 17 — ABNORMAL HIGH (ref 5–15)
BUN: 21 mg/dL — ABNORMAL HIGH (ref 6–20)
CO2: 19 mmol/L — ABNORMAL LOW (ref 22–32)
Calcium: 9.2 mg/dL (ref 8.9–10.3)
Chloride: 89 mmol/L — ABNORMAL LOW (ref 101–111)
Creatinine, Ser: 2.63 mg/dL — ABNORMAL HIGH (ref 0.44–1.00)
GFR calc Af Amer: 23 mL/min — ABNORMAL LOW (ref >60)
GFR calc non Af Amer: 20 mL/min — ABNORMAL LOW (ref >60)
Glucose, Bld: 600 mg/dL (ref 65–99)
Potassium: 4.1 mmol/L (ref 3.5–5.1)
Sodium: 125 mmol/L — ABNORMAL LOW (ref 135–145)

## 2016-12-16 LAB — CBG MONITORING, ED: Glucose-Capillary: >600 mg/dL (ref 65–99)

## 2016-12-16 MED ORDER — DEXTROSE-NACL 5-0.45 % IV SOLN: INTRAVENOUS | Status: DC

## 2016-12-16 MED ORDER — POTASSIUM CHLORIDE 10 MEQ/100ML IV SOLN
10.0000 meq | INTRAVENOUS | Status: AC
  Administered 2016-12-17: 10 meq via INTRAVENOUS

## 2016-12-16 MED ORDER — CEFAZOLIN SODIUM-DEXTROSE 2-4 GM/100ML-% IV SOLN
2.0000 g | Freq: Once | INTRAVENOUS | Status: AC
  Administered 2016-12-16: 2 g via INTRAVENOUS
  Filled 2016-12-16: qty 100

## 2016-12-16 MED ORDER — LACTATED RINGERS IV BOLUS (SEPSIS)
1000.0000 mL | Freq: Once | INTRAVENOUS | Status: AC
  Administered 2016-12-16: 1000 mL via INTRAVENOUS

## 2016-12-16 MED ORDER — INSULIN REGULAR HUMAN 100 UNIT/ML IJ SOLN
INTRAMUSCULAR | Status: DC
  Administered 2016-12-17: 5.4 [IU]/h via INTRAVENOUS
  Filled 2016-12-16: qty 1

## 2016-12-16 MED ORDER — SODIUM CHLORIDE 0.9 % IV BOLUS (SEPSIS)
1000.0000 mL | Freq: Once | INTRAVENOUS | Status: AC
Start: 2016-12-16 — End: 2016-12-17
  Administered 2016-12-16: 1000 mL via INTRAVENOUS

## 2016-12-16 MED ORDER — HYDROMORPHONE HCL 1 MG/ML IJ SOLN
1.0000 mg | Freq: Once | INTRAMUSCULAR | Status: AC
  Administered 2016-12-16: 1 mg via INTRAMUSCULAR
  Filled 2016-12-16: qty 1

## 2016-12-16 NOTE — H&P (Signed)
[facility] Admission History and Physical Service Pager: [phone]  Patient name: [name] Medical record number: [id number] Date of birth: [birth year] Age: 51 y.o. Gender: female  Primary Care Provider: [name], MD Consultants: Orthopedics Code Status: Full  Chief Complaint: left foot laceration with fractures, HHS  Assessment and Plan: [name] is a 51 y.o. female presenting with left foot laceration s/p fall. PMH is significant for T2DM, COPD, morbid obesity, HLD, HTN, CKD3, HFpEF  Hyperglycemia concerning for HHS:  Possible HHS on admission with hyperglycemia >600, serum bicarbonate >18. UA pending to observe ketones. Patient received 2 L bolus and started on insulin gtt in the ED. Patient was recently discharge from the hospital on 12/13/2016 for presenting with a very similar picture. Patient denies taking her insulin today, but endorses taking it yesterday. She reports not being able to check her blood sugar at home because her she does not have a glucometer. Per discharge orders, patient discharged with a glucometer. Patient was also only recently diagnosed with T2DM in the office on 12/12/2016. Uses lantus 16 units qhs at home. A1C 9.3. Patient had dry mucus membranes - admit to stepdown attending Dr. Mingo Osborne - HHS protocol, give normal saline (s/p 2 1L boluses in ED) and switch to D5NS at 300 cbg - insulin gtt - BMP q4hr - K 16mq IV x 1 per protocol - follow up UA  Anion gap metabolic acidosis: Anion gap of 17 with mildly low bicarb of 19.  Likely related to the above issue versus her AKI on CKD 3.  Lactic acid level is normal. - Monitor with labs  Laceration of left foot with multiple fractures Patient fell this morning after taking a few steps from when standing leaving the bathroom. She says her left leg "gave out" on her. Patient denies LOC. Denies hitting her head. Denies loss of bowel of bladder function. She also denies  hitting her foot on anything. Endorses some mild shoulder pain. Left foot has deep lacerations on digits 3-5. Left foot xray shows mildly displaced fractures of the distal second, third and fourth metatarsals, small fractures of the medial and lateral base of the proximal phalanx of the fifth toe, questionable fracture of the medial sesamoid versus multiple ossification centers. Patient denies ingesting alcohol or using drugs, but does have a history of crack cocaine use. Fall was not likely seizure given no LOC. Cardiac possible given h/o CHF, but does not appear volume overloaded. Likely due to orthostatic hypotension 2/2/ dehydration. Pt did have elevated WBC of 16. Possibly fall could be caused by an infection (UTI vs. PNA?) Will get chest xray to evaluate for volume overload in chest and PNA. UA to r/o UTI. WBC elevated likely due to hemoconcentration from dehydration (Hg was 17.2). Possibley elevated due to stress from injury. Orthopedics was evaluated in the ED. They wanted patient admitted for hospital for possible surgical debribment. Ancef started in the ED. Pt did receive dilaudid in ED for pain.  - appreciate orthopedics recommendations - cont Ancef per Orth[facility] recommendations (11/19 -)  - order PT/OT consult after surgery - UDS - Tylenol for pain  Elevated white blood cell count/elevated hemoglobin: Likely due to hemoconcentration in the setting of significant hyperglycemia.  Chest x-ray is negative for infection.  No other signs or symptoms of infection noted -Monitor with labs  Hyponatremia: likely due to hyperglycemia. Corrected Na 133. Should improve with treatment of hyperglycemia.  AKI on CKD3. - Cr baseline1.4-1.7. On  admit 2.63. Likely due to dehydration in the setting of HHS.  -avoid nephrotoxic medications -held gabapentin/losartin/hctz - monitor Cr  COPD- no current SOB complaints, Patient did not endorse any SOB, but did have 1 episode of desaturation down to 83%. Nasal  canula 2L O2 was started. Likely obesity hypoventilation syndrome given BMI/body habitus and de. Does not use oxygen at home. Smokes 1 pack of cigarettes a day -home ventolin -home singulair -home spiriva -Continue Dulera  OSA: Patient reports history of using CPAP - monitor respiratory status - CPAP order placed - consider outpatient referral for sleep study  HFpEF 55-60%- from echo 6 months ago. No crackles on lung exam.  -will monitor for fluid overload given significant rehydration involved with HHS protocol - CXR  Oral Thrush. Patient presented with white plaque on tongue. Has a h/o thrush treated with nystatin rinse at home - continue oral rinse -Consider repeating HIV.  Last tested 11/23/2015  HLD -home atorvastatin 40   HTN- Hypotensive on admission to 94/96.  -hold home BP meds temp to avoid hypotension  History of substance abuse (crack)  -avoid B-blockers   FEN/GI: NPO Prophylaxis: lovenox  Disposition: > 2 midnights in hospital, discharge pending improvement in hyperglycemia and orthopedic recommenations  History of Present Illness:  [name] is a 51 y.o. female presenting with presenting with left foot laceration s/p fall. She was walking out of the bathroom and tripped because her right leg gave out on her. She fell on her left side. She could not get up but tried to crawl to her bed. She could not get in her bed so she fell asleep. Then she woke up and was able to get onto bed and she fell asleep again. She denies any drug use. She has not been checking her glucose as she does not have a glucometer yet. She said her pharmacy did not have the right "code". She did not have insulin when this happened. She has not taken any of her medications today. This morning she reports she was not feeling herself. She denies any fevers or chills. She denies worsening shortness of breath. Endorses chronic cough. Denies dizziness, lightheadedness, chest pain prior to  falling. She did not hit her head and did not have LOC. She did not have loss of bowel or bladder. She endorses using CPAP at night.    Review Of Systems: Per HPI with the following additions:   Review of Systems  Constitutional: Negative for chills and fever.  HENT: Positive for sore throat.   Eyes: Negative for blurred vision.  Respiratory: Positive for cough (chronic, no change). Negative for shortness of breath.   Cardiovascular: Negative for chest pain and palpitations.  Gastrointestinal: Negative for abdominal pain, constipation, diarrhea, nausea and vomiting.  Genitourinary: Negative for dysuria.  Musculoskeletal: Positive for falls, joint pain (Left foot) and myalgias.  Neurological: Negative for dizziness, focal weakness, loss of consciousness, weakness and headaches.    Patient Active Problem List   Diagnosis Date Noted  . Pressure injury of skin 12/13/2016  . Hypokalemia   . AKI (acute kidney injury) ([place])   . Type 2 diabetes mellitus with hyperosmolarity without nonketotic hyperglycemic-hyperosmolar coma ([place]) 12/12/2016  . Type 2 diabetes mellitus with complication, without long-term current use of insulin ([place]) 12/11/2016  . Yeast infection 12/11/2016  . CKD (chronic kidney disease) stage 3, GFR 30-59 ml/min ([facility]) 12/11/2016  . Urge incontinence of urine 12/05/2016  . Need for immunization against influenza 11/25/2016  . Stenosing  tenosynovitis of finger of left hand 06/28/2016  . Prediabetes 05/09/2015  . Substance abuse ([place]) 11/01/2013  . Chronic diastolic CHF (congestive heart failure) ([place]) 04/07/2012  . Allergic rhinitis 03/29/2011  . GERD 01/26/2010  . MIGRAINE HEADACHE 10/25/2009  . DYSFUNCTIONAL UTERINE BLEEDING 10/25/2009  . ALOPECIA 06/14/2009  . Hypertension 08/04/2008  . OBSTRUCTIVE SLEEP APNEA 02/03/2008  . FIBROCYSTIC BREAST DISEASE 10/28/2006  . Hyperlipidemia 03/27/2006  . OBESITY, NOS 03/27/2006  . TOBACCO DEPENDENCE 03/27/2006  . Depression  with anxiety 03/27/2006  . NEUROGENIC BLADDER 03/27/2006  . Dyspnea 03/27/2006    Past Medical History: Past Medical History:  Diagnosis Date  . Arthritis 04-10-11   hips, shoulders, back  . Asthma   . Bipolar disorder ([place])   . Cancer ([place]) 1993   cervical, no treatment done, went away per pt  . Cervical dysplasia or atypia 04-10-11   '93- once dx.-got pregnant-no intervention, then postpartum, no dysplasia found  . CHF (congestive heart failure) ([place])    no cardiologist 2014 dx, none now  . 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 Pathology showed Condyloma, gluteal CONDYLOMA ACUMINATUM.   [name] Kitchen COPD (chronic obstructive pulmonary disease) ([place])   . Depression   . Diabetes mellitus without complication ([place])   . Dyspnea    with actity - better since taking Singular  . Fibrocystic breast disease   . GERD (gastroesophageal reflux disease)   . Hyperlipidemia   . Hypertension   . Migraine headache    none recent  . Neuropathy   . Skin lesion 03/15/2011   In gluteal crease now s/p removal by Dr. [name] of General Surgery on 3/12. Path shows condyloma.     . Sleep apnea 04-10-11   uses cpap, pt does not know settings  . Trigger finger    left third    Past Surgical History: Past Surgical History:  Procedure Laterality Date  . BACK SURGERY  04-10-11   x5-Lumbar fusion-retained hardware.(Dr. Louanne Skye)  . BREAST CYST EXCISION     bilateral breast, 3 cysts removed from each breast  . BREAST EXCISIONAL BIOPSY Right    x 3  . BREAST EXCISIONAL BIOPSY Left    x 3  . CARPAL TUNNEL RELEASE Bilateral   . Cervical biospy    . COLONOSCOPY WITH PROPOFOL N/A 07/06/2015   Performed by [name], MD at [place]  . DOPPLER ECHOCARDIOGRAPHY  04/06/2012   AT [place] 55-60%  . FISTULECTOMY ANAL N/A 04/17/2011   Performed by Maia Petties., MD at [facility] ORS  . MULTIPLE TOOTH EXTRACTIONS    . NECK SURGERY  04-10-11   x3- cervical fusion with plating and  screws-Dr. [name]  . RELEASE TRIGGER FINGER LEFT 3RD FINGER Left 06/28/2016   Performed by [name], MD at [place] Right 06/12/2016   Performed by [name], MD at [place]  . TRANSESOPHAGEAL ECHOCARDIOGRAM (TEE) N/A 04/06/2012   Performed by [name]., MD at [facility] ENDOSCOPY  . TRIGGER FINGER RELEASE Right    middle finger    Social History: Social History   Tobacco Use  . Smoking status: Current Every Day Smoker    Packs/day: 0.50    Years: 38.00    Pack years: 19.00    Types: Cigarettes    Start date: 01/28/1977  . Smokeless tobacco: Never Used  Substance Use Topics  . Alcohol use: No    Alcohol/week: 0.0  oz  . Drug use: Yes    Types: Marijuana, "Crack" cocaine    Comment: hx marijuana usemany years ago, Crack -none since 11/2015   Additional social history: Denies alcohol or drug use. Smokes 1 pack of cigarettes a day  Please also refer to relevant sections of EMR.  Family History: Family History  Problem Relation Age of Onset  . Stroke Father   . Cancer Maternal Aunt        breast   (If not completed, MUST add something in)  Allergies and Medications: Allergies  Allergen Reactions  . 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 Levofloxacin Itching  . Nicotine Rash and Other (See Comments)    Patch caused a rash   No current facility-administered medications on file prior to encounter.    Current Outpatient Medications on File Prior to Encounter  Medication Sig Dispense Refill  . amLODipine (NORVASC) 10 MG tablet TAKE 1 TABLET ONCE DAILY FOR BLOOD PRESSURE. (Patient taking differently: 10 mg daily. FOR BLOOD PRESSURE) 30 tablet 0  . atorvastatin (LIPITOR) 40 MG tablet Take 1 tablet (40 mg total) daily by mouth. 90 tablet 3  . blood glucose meter kit and supplies KIT Dispense based on patient and insurance preference. Use up to four times daily as directed. (FOR ICD-9 250.00, 250.01). 1 each 0  .  busPIRone (BUSPAR) 10 MG tablet Take 1 tablet (10 mg total) by mouth 3 (three) times daily. 90 tablet 11  . cetirizine (QC ALL DAY ALLERGY) 10 MG tablet TAKE 1 TABLET EACH DAY. (Patient taking differently: Take 10 mg by mouth daily. ) 30 tablet 11  . citalopram (CELEXA) 20 MG tablet Take 1 tablet (20 mg total) daily by mouth. 90 tablet 0  . fluticasone (FLONASE) 50 MCG/ACT nasal spray Place 2 sprays into both nostrils 2 (two) times daily. 16 g 6  . gabapentin (NEURONTIN) 300 MG capsule TAKE (1) CAPSULE THREE TIMES DAILY. (Patient taking differently: Take 300 mg 3 (three) times daily by mouth. ) 90 capsule 3  . hydrochlorothiazide (HYDRODIURIL) 25 MG tablet TAKE 1 TABLET EACH DAY. (Patient taking differently: Take 25 mg daily by mouth. ) 90 tablet 0  . Insulin Glargine (LANTUS SOLOSTAR) 100 UNIT/ML Solostar Pen Inject 16 Units every morning into the skin. If your fasting blood sugar in the morning is greater than 300, increase your lantus dose by ONE unit. 5 pen PRN  . Insulin Pen Needle 29G X 5MM MISC Check sugars TID before meals. 90 each 0  . losartan (COZAAR) 100 MG tablet Take 1 tablet (100 mg total) daily by mouth. 90 tablet 3  . mometasone-formoterol (DULERA) 200-5 MCG/ACT AERO Inhale 2 puffs into the lungs 2 (two) times daily. (Patient not taking: Reported on 12/12/2016) 8.8 g 0  . montelukast (SINGULAIR) 10 MG tablet TAKE 1 TABLET AT BEDTIME AS DIRECTED. (Patient taking differently: Take 10 mg at bedtime by mouth. AS DIRECTED) 30 tablet 0  . NON FORMULARY Uses a C-PAP at bedtime    . nystatin (MYCOSTATIN) 100000 UNIT/ML suspension Take 15 mLs 2 (two) times daily by mouth. SWISH FOR 30 SECONDS AND SPIT    . omeprazole (PRILOSEC) 20 MG capsule TAKE (1) CAPSULE DAILY. (Patient taking differently: Take 20 mg daily by mouth. ) 30 capsule 0  . polyethylene glycol (MIRALAX) packet Take 17 g by mouth daily. (Patient not taking: Reported on 12/12/2016) 14 each 0  . SPIRIVA HANDIHALER 18 MCG inhalation  capsule INHALE CONTENTS OF 1 CAPSULE DAILY. 30 capsule 0  . VENTOLIN HFA 108 (90 Base) MCG/ACT inhaler USE 1-2 PUFFS INTO LUNGS EVERY 4 HOURS AS NEEDED FOR WHEEZING OR SHORTNESS OF BREATH. 18 g 0  . [DISCONTINUED] sucralfate (CARAFATE) 1 G tablet Take 1 tablet (1 g total) by mouth daily. 30 tablet 6    Objective: BP 94/66 (BP Location: Left Arm)   Pulse (!) 106   Temp 98.1 F (36.7 C) (Oral)   Resp 16   Ht '5\' 10"'  (1.778 m)   Wt (!) 370 lb (167.8 kg)   LMP 04/10/2006   SpO2 99%   BMI 53.09 kg/m  Exam: General: resting in bed, somnolent, but arousable, morbidly obese, AA female Eyes: PERRL, anicteric ENTM: dry mucus membranes, oral thrush Neck: no JVD,  Cardiovascular: rrr, no mrg Respiratory: diminished breath sounds likely due to obesity, no crackles, normal work of breathing, Nasal canula 2 L Gastrointestinal: large central panis, normal bowel sounds, soft, nontender, nondistended MSK: left foot swollen, digits 3-5 laceration on plantar surface, no erythema, foot not warm to touch, patient able to move all digits on left foot, 2+ dp, no point tenderness over left knee, 5/5 strength grossly throughout all extremities, passive ROM intact in left shoulder Derm: other than left foot laceration, skin, warm, dry, intact Neuro: somnolent, but arousable, A&Ox3, moves all digits on left foot, sensation intact except in most distal portion of great toe (likely due to peripheral neuropathy),  Psych: appropriate mood and affect         Labs and Imaging: CBC BMET  Recent Labs  Lab 12/12/16 1459  WBC 11.3*  HGB 15.8*  HCT 43.0  PLT 330   Recent Labs  Lab 12/13/16 1006  NA 132*  K 4.6  CL 101  CO2 21*  BUN 15  CREATININE 1.42*  GLUCOSE 316*  CALCIUM 8.6Bonnita Hollow, MD 12/16/2016, 10:40 PM PGY-1, [place] Intern pager: [phone], text pages welcome  UPPER LEVEL ADDENDUM  I have read the above note and made revisions  highlighted in blue.  Mary Houseman, MD PGY-2 [name] Pager [phone]

## 2016-12-16 NOTE — ED Notes (Signed)
Pt noted with SpO2 <92%. Hx of OSA with CPAP use at night. Placed on 3Lpm of O2 via [place].

## 2016-12-16 NOTE — ED Notes (Signed)
Ralene Bathe, MD and admitting aware of CBG.

## 2016-12-16 NOTE — ED Notes (Signed)
ED Provider at bedside. 

## 2016-12-16 NOTE — ED Provider Notes (Signed)
[facility] EMERGENCY DEPARTMENT Provider Note   CSN: [id number] Arrival date & time: 12/16/16  1627     History   Chief Complaint Chief Complaint  Patient presents with  . Foot Injury    HPI [name] is a 51 y.o. female.  The history is provided by the patient. No language interpreter was used.  Foot Injury      [name] is a 51 y.o. female who presents to the Emergency Department complaining of foot injury.  Morning about 7 AM she was walking out of her bathroom when she tripped catching her left foot and she fell to the ground hitting her right shoulder.  She did not hit her head.  She experienced immediate foot pain.  She was able to get himself back up into bed and went back to sleep.  She presents this evening due to pain in the foot.  She was recently hospitalized for her diabetes.  She denies any headache, chest pain, bone pain, nausea, vomiting.  She does have moderate to severe pain in the left foot and toes.  Past Medical History:  Diagnosis Date  . Arthritis 04-10-11   hips, shoulders, back  . Asthma   . Bipolar disorder ([place])   . Cancer ([place]) 1993   cervical, no treatment done, went away per pt  . Cervical dysplasia or atypia 04-10-11   '93- once dx.-got pregnant-no intervention, then postpartum, no dysplasia found  . CHF (congestive heart failure) ([place])    no cardiologist 2014 dx, none now  . 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 Pathology showed Condyloma, gluteal CONDYLOMA ACUMINATUM.   [name] Kitchen COPD (chronic obstructive pulmonary disease) ([place])   . Depression   . Diabetes mellitus without complication ([place])   . Dyspnea    with actity - better since taking Singular  . Fibrocystic breast disease   . GERD (gastroesophageal reflux disease)   . Hyperlipidemia   . Hypertension   . Migraine headache    none recent  . Neuropathy   . Skin lesion 03/15/2011   In gluteal crease now s/p removal by Dr. [name] of  General Surgery on 3/12. Path shows condyloma.     . Sleep apnea 04-10-11   uses cpap, pt does not know settings  . Trigger finger    left third    Patient Active Problem List   Diagnosis Date Noted  . Pressure injury of skin 12/13/2016  . Hypokalemia   . Osborne (acute kidney injury) ([place])   . Type 2 diabetes mellitus with hyperosmolarity without nonketotic hyperglycemic-hyperosmolar coma ([place]) 12/12/2016  . Type 2 diabetes mellitus with complication, without long-term current use of insulin ([place]) 12/11/2016  . Yeast infection 12/11/2016  . CKD (chronic kidney disease) stage 3, GFR 30-59 ml/min (HCC) 12/11/2016  . Urge incontinence of urine 12/05/2016  . Need for immunization against influenza 11/25/2016  . Stenosing tenosynovitis of finger of left hand 06/28/2016  . Prediabetes 05/09/2015  . Substance abuse ([place]) 11/01/2013  . Chronic diastolic CHF (congestive heart failure) ([place]) 04/07/2012  . Allergic rhinitis 03/29/2011  . GERD 01/26/2010  . MIGRAINE HEADACHE 10/25/2009  . DYSFUNCTIONAL UTERINE BLEEDING 10/25/2009  . ALOPECIA 06/14/2009  . Hypertension 08/04/2008  . OBSTRUCTIVE SLEEP APNEA 02/03/2008  . FIBROCYSTIC BREAST DISEASE 10/28/2006  . Hyperlipidemia 03/27/2006  . OBESITY, NOS 03/27/2006  . TOBACCO DEPENDENCE 03/27/2006  . Depression with anxiety 03/27/2006  . NEUROGENIC BLADDER 03/27/2006  . Dyspnea 03/27/2006  Past Surgical History:  Procedure Laterality Date  . BACK SURGERY  04-10-11   x5-Lumbar fusion-retained hardware.(Dr. Louanne Skye)  . BREAST CYST EXCISION     bilateral breast, 3 cysts removed from each breast  . BREAST EXCISIONAL BIOPSY Right    x 3  . BREAST EXCISIONAL BIOPSY Left    x 3  . CARPAL TUNNEL RELEASE Bilateral   . Cervical biospy    . COLONOSCOPY WITH PROPOFOL N/A 07/06/2015   Performed by [name], MD at [facility]  . DOPPLER ECHOCARDIOGRAPHY  04/06/2012   AT [place] 55-60%  . FISTULECTOMY ANAL N/A 04/17/2011   Performed by  Maia Petties., MD at [facility] ORS  . MULTIPLE TOOTH EXTRACTIONS    . NECK SURGERY  04-10-11   x3- cervical fusion with plating and screws-Dr. [name]  . RELEASE TRIGGER FINGER LEFT 3RD FINGER Left 06/28/2016   Performed by [name], MD at [place] Right 06/12/2016   Performed by [name], MD at [facility]  . TRANSESOPHAGEAL ECHOCARDIOGRAM (TEE) N/A 04/06/2012   Performed by [name]., MD at [facility] ENDOSCOPY  . TRIGGER FINGER RELEASE Right    middle finger    OB History    No data available       Home Medications    Prior to Admission medications   Medication Sig Start Date End Date Taking? Authorizing Provider  amLODipine (NORVASC) 10 MG tablet TAKE 1 TABLET ONCE DAILY FOR BLOOD PRESSURE. Patient taking differently: 10 mg daily. FOR BLOOD PRESSURE 12/10/16   [name], MD  atorvastatin (LIPITOR) 40 MG tablet Take 1 tablet (40 mg total) daily by mouth. 12/11/16   [name], MD  blood glucose meter kit and supplies KIT Dispense based on patient and insurance preference. Use up to four times daily as directed. (FOR ICD-9 250.00, 250.01). 12/11/16   [name], MD  busPIRone (BUSPAR) 10 MG tablet Take 1 tablet (10 mg total) by mouth 3 (three) times daily. 05/03/16   Vivi Barrack, MD  cetirizine (QC ALL DAY ALLERGY) 10 MG tablet TAKE 1 TABLET EACH DAY. Patient taking differently: Take 10 mg by mouth daily.  11/30/15   Vivi Barrack, MD  citalopram (CELEXA) 20 MG tablet Take 1 tablet (20 mg total) daily by mouth. 12/10/16   [name], MD  fluticasone (FLONASE) 50 MCG/ACT nasal spray Place 2 sprays into both nostrils 2 (two) times daily. 05/20/16   Zenia Resides, MD  gabapentin (NEURONTIN) 300 MG capsule TAKE (1) CAPSULE THREE TIMES DAILY. Patient taking differently: Take 300 mg 3 (three) times daily by mouth.  12/10/16   [name], MD  hydrochlorothiazide (HYDRODIURIL) 25 MG tablet TAKE 1 TABLET EACH DAY. Patient taking differently:  Take 25 mg daily by mouth.  12/10/16   [name], MD  Insulin Glargine (LANTUS SOLOSTAR) 100 UNIT/ML Solostar Pen Inject 16 Units every morning into the skin. If your fasting blood sugar in the morning is greater than 300, increase your lantus dose by ONE unit. 12/15/16   [name], MD  Insulin Pen Needle 29G X 5MM MISC Check sugars TID before meals. 12/11/16   [name], MD  losartan (COZAAR) 100 MG tablet Take 1 tablet (100 mg total) daily by mouth. 12/11/16   [name], MD  mometasone-formoterol [facility]) 200-5 MCG/ACT AERO Inhale 2 puffs into the lungs 2 (two) times daily. Patient not taking: Reported on 12/12/2016 11/23/15   Jerline Pain,  Algis Greenhouse, MD  montelukast (SINGULAIR) 10 MG tablet TAKE 1 TABLET AT BEDTIME AS DIRECTED. Patient taking differently: Take 10 mg at bedtime by mouth. AS DIRECTED 12/11/16   [name], MD  NON FORMULARY Uses a C-PAP at bedtime    [provider]  nystatin (MYCOSTATIN) 100000 UNIT/ML suspension Take 15 mLs 2 (two) times daily by mouth. SWISH FOR 30 SECONDS AND SPIT    [provider]  omeprazole (PRILOSEC) 20 MG capsule TAKE (1) CAPSULE DAILY. Patient taking differently: Take 20 mg daily by mouth.  12/11/16   [name], MD  polyethylene glycol [facility]) packet Take 17 g by mouth daily. Patient not taking: Reported on 12/12/2016 09/23/16   Fransico Meadow, PA-C  SPIRIVA HANDIHALER 18 MCG inhalation capsule INHALE CONTENTS OF 1 CAPSULE DAILY. 11/25/16   [name], MD  VENTOLIN HFA 108 (90 Base) MCG/ACT inhaler USE 1-2 PUFFS INTO LUNGS EVERY 4 HOURS AS NEEDED FOR WHEEZING OR SHORTNESS OF BREATH. 11/29/16   [name], MD    Family History Family History  Problem Relation Age of Onset  . Stroke Father   . Cancer Maternal Aunt        breast    Social History Social History   Tobacco Use  . Smoking status: Current Every Day Smoker    Packs/day: 0.50    Years: 38.00    Pack years: 19.00    Types: Cigarettes    Start date:  01/28/1977  . Smokeless tobacco: Never Used  Substance Use Topics  . Alcohol use: No    Alcohol/week: 0.0 oz  . Drug use: Yes    Types: Marijuana, "Crack" cocaine    Comment: hx marijuana usemany years ago, Crack -none since 11/2015     Allergies   Methadone hcl; Levofloxacin; and Nicotine   Review of Systems Review of Systems  All other systems reviewed and are negative.    Physical Exam Updated Vital Signs BP 94/66 (BP Location: Left Arm)   Pulse (!) 106   Temp 98.1 F (36.7 C) (Oral)   Resp 16   Ht '5\' 10"'  (1.778 m)   Wt (!) 167.8 kg (370 lb)   LMP 04/10/2006   SpO2 99%   BMI 53.09 kg/m   Physical Exam  Constitutional: She is oriented to person, place, and time. She appears well-developed and well-nourished.  Obese  HENT:  Head: Normocephalic and atraumatic.  Cardiovascular: Normal rate and regular rhythm.  No murmur heard. Pulmonary/Chest: Effort normal and breath sounds normal. No respiratory distress.  Abdominal: Soft. There is no tenderness. There is no rebound and no guarding.  Musculoskeletal:  2+ DP pulses bilaterally there is moderate edema and tenderness with ecchymosis over the left mid and distal foot.  There are deep lacerations on the plantar surface at the base of the fourth and fifth toes.  There is dirt and carpet in the wounds on the toes.  Neurological: She is alert and oriented to person, place, and time.  Skin: Skin is warm and dry.  Psychiatric: She has a normal mood and affect. Her behavior is normal.  Nursing note and vitals reviewed.          ED Treatments / Results  Labs (all labs ordered are listed, but only abnormal results are displayed) Labs Reviewed  BASIC METABOLIC PANEL - Abnormal; Notable for the following components:      Result Value   Sodium 125 (*)    Chloride 89 (*)    CO2 19 (*)  BUN 21 (*)    Creatinine, Ser 2.63 (*)    GFR calc non Af Amer 20 (*)    GFR calc Af Amer 23 (*)    Anion gap 17 (*)    All  other components within normal limits  CBC WITH DIFFERENTIAL/PLATELET - Abnormal; Notable for the following components:   WBC 16.2 (*)    RBC 5.92 (*)    Hemoglobin 17.2 (*)    HCT 46.7 (*)    MCHC 36.8 (*)    All other components within normal limits    EKG  EKG Interpretation None       Radiology Dg Foot Complete Left  Result Date: 12/16/2016 CLINICAL DATA:  Patient reports falling and injuring her left foot, patient has lateral laceration to her left pinky toe. EXAM: LEFT FOOT - COMPLETE 3+ VIEW COMPARISON:  None. FINDINGS: There are multiple fractures. There are transverse fractures across the distal second, third and fourth metatarsals. The second and third metatarsal fractures are mildly comminuted. There is mild displacement in a lateral plantar direction of the distal fracture components, maximum of 3 mm. There is also fracture from the medial base of the proximal phalanx of the fifth toe. It is mildly displaced by 2 mm. There is also a nondisplaced fracture across the lateral base of the proximal phalanx of the fifth toe. There are 4 separate bony components of the medial sesamoid. This may be developmental. There are relatively sharp margins between the distal and proximal sesamoid components on the lateral view suggesting that this may reflect acute fractures. No other fractures.  No dislocation. There is forefoot soft tissue swelling. IMPRESSION: 1. Mildly displaced fractures of the distal second, third and fourth metatarsals as described. 2. Small fractures of the medial and lateral base of the proximal phalanx of the fifth toe. 3. Questionable fracture of the medial sesamoid versus multiple ossification centers. 4. No dislocation. Electronically Signed   By: Lajean Manes M.D.   On: 12/16/2016 19:18    Procedures Procedures (including critical care time)  Medications Ordered in ED Medications  HYDROmorphone (DILAUDID) injection 1 mg (1 mg Intramuscular Given 12/16/16 2125)    ceFAZolin (ANCEF) IVPB 2g/100 mL premix (2 g Intravenous New Bag/Given 12/16/16 2232)     Initial Impression / Assessment and Plan / ED Course  I have reviewed the triage vital signs and the nursing notes.  Pertinent labs & imaging results that were available during my care of the patient were reviewed by me and considered in my medical decision making (see chart for details).     Patient here for evaluation of injuries following a mechanical fall that happened at 7 AM this morning.  She has a dirty wound to the plantar surface of the left foot swelled with debris that is been present for greater than 12 hours.  She has multiple fractures in the foot.  Discussed with Dr. Erlinda Hong with [facility] orthopedics, recommends IV antibiotics and cleaning the foot at bedside as able and he will see the patient in the morning.  He recommends family medicine admission for diabetes control.  Family medicine consulted for admission.  Patient updated of findings of studies and plan for admission for treatment and she is in agreement with plan.  Final Clinical Impressions(s) / ED Diagnoses   Final diagnoses:  Multiple closed fractures of left foot, initial encounter  Laceration of left foot, initial encounter    ED Discharge Orders    None  Quintella Reichert, MD 12/16/16 2303

## 2016-12-16 NOTE — ED Triage Notes (Signed)
Pt states she fell today and twisted her left foot. Top of left foot is swollen. Dried blood between left toes. Pt states she cannot put pressure on her foot.

## 2016-12-17 ENCOUNTER — Encounter (HOSPITAL_COMMUNITY): Payer: Self-pay | Admitting: General Practice

## 2016-12-17 ENCOUNTER — Other Ambulatory Visit: Payer: Self-pay

## 2016-12-17 ENCOUNTER — Inpatient Hospital Stay (HOSPITAL_COMMUNITY): Payer: Medicare Other

## 2016-12-17 DIAGNOSIS — S92502B Displaced unspecified fracture of left lesser toe(s), initial encounter for open fracture: Secondary | ICD-10-CM

## 2016-12-17 DIAGNOSIS — E870 Hyperosmolality and hypernatremia: Secondary | ICD-10-CM | POA: Diagnosis present

## 2016-12-17 DIAGNOSIS — S92902A Unspecified fracture of left foot, initial encounter for closed fracture: Secondary | ICD-10-CM

## 2016-12-17 LAB — CBC
HEMATOCRIT: 44.8 % (ref 36.0–46.0)
HEMATOCRIT: 42.6 % (ref 36.0–46.0)
Hemoglobin: 16.4 g/dL — ABNORMAL HIGH (ref 12.0–15.0)
Hemoglobin: 15.4 g/dL — ABNORMAL HIGH (ref 12.0–15.0)
MCH: 29.1 pg (ref 26.0–34.0)
MCH: 28.8 pg (ref 26.0–34.0)
MCHC: 36.6 g/dL — ABNORMAL HIGH (ref 30.0–36.0)
MCHC: 36.2 g/dL — AB (ref 30.0–36.0)
MCV: 79.4 fL (ref 78.0–100.0)
MCV: 79.8 fL (ref 78.0–100.0)
PLATELETS: 290 10*3/uL (ref 150–400)
Platelets: 303 10*3/uL (ref 150–400)
RBC: 5.64 MIL/uL — ABNORMAL HIGH (ref 3.87–5.11)
RBC: 5.34 MIL/uL — ABNORMAL HIGH (ref 3.87–5.11)
RDW: 13.6 % (ref 11.5–15.5)
RDW: 13.6 % (ref 11.5–15.5)
WBC: 13.7 10*3/uL — AB (ref 4.0–10.5)
WBC: 13.3 10*3/uL — AB (ref 4.0–10.5)

## 2016-12-17 LAB — BASIC METABOLIC PANEL
ANION GAP: 12 (ref 5–15)
Anion gap: 14 (ref 5–15)
BUN: 23 mg/dL — ABNORMAL HIGH (ref 6–20)
BUN: 23 mg/dL — AB (ref 6–20)
CHLORIDE: 97 mmol/L — AB (ref 101–111)
CHLORIDE: 100 mmol/L — AB (ref 101–111)
CO2: 23 mmol/L (ref 22–32)
CO2: 21 mmol/L — AB (ref 22–32)
CREATININE: 1.95 mg/dL — AB (ref 0.44–1.00)
Calcium: 8.7 mg/dL — ABNORMAL LOW (ref 8.9–10.3)
Calcium: 8.7 mg/dL — ABNORMAL LOW (ref 8.9–10.3)
Creatinine, Ser: 2.24 mg/dL — ABNORMAL HIGH (ref 0.44–1.00)
GFR calc Af Amer: 33 mL/min — ABNORMAL LOW (ref >60)
GFR calc non Af Amer: 28 mL/min — ABNORMAL LOW (ref >60)
GFR, EST AFRICAN AMERICAN: 28 mL/min — AB (ref >60)
GFR, EST NON AFRICAN AMERICAN: 24 mL/min — AB (ref >60)
Glucose, Bld: 361 mg/dL — ABNORMAL HIGH (ref 65–99)
Glucose, Bld: 158 mg/dL — ABNORMAL HIGH (ref 65–99)
POTASSIUM: 3.4 mmol/L — AB (ref 3.5–5.1)
Potassium: 3.5 mmol/L (ref 3.5–5.1)
SODIUM: 132 mmol/L — AB (ref 135–145)
Sodium: 135 mmol/L (ref 135–145)

## 2016-12-17 LAB — CBG MONITORING, ED
GLUCOSE-CAPILLARY: 152 mg/dL — AB (ref 65–99)
GLUCOSE-CAPILLARY: 185 mg/dL — AB (ref 65–99)
GLUCOSE-CAPILLARY: 224 mg/dL — AB (ref 65–99)
Glucose-Capillary: 522 mg/dL (ref 65–99)
Glucose-Capillary: 398 mg/dL — ABNORMAL HIGH (ref 65–99)
Glucose-Capillary: 411 mg/dL — ABNORMAL HIGH (ref 65–99)
Glucose-Capillary: 213 mg/dL — ABNORMAL HIGH (ref 65–99)
Glucose-Capillary: 162 mg/dL — ABNORMAL HIGH (ref 65–99)
Glucose-Capillary: 172 mg/dL — ABNORMAL HIGH (ref 65–99)

## 2016-12-17 LAB — URINALYSIS, ROUTINE W REFLEX MICROSCOPIC
BILIRUBIN URINE: NEGATIVE
Glucose, UA: >=500 mg/dL — AB
Ketones, ur: 5 mg/dL — AB
LEUKOCYTES UA: NEGATIVE
Nitrite: NEGATIVE
PH: 5 (ref 5.0–8.0)
PROTEIN: NEGATIVE mg/dL
SPECIFIC GRAVITY, URINE: 1.025 (ref 1.005–1.030)

## 2016-12-17 LAB — I-STAT CG4 LACTIC ACID, ED: LACTIC ACID, VENOUS: 1.44 mmol/L (ref 0.5–1.9)

## 2016-12-17 LAB — GLUCOSE, CAPILLARY
GLUCOSE-CAPILLARY: 243 mg/dL — AB (ref 65–99)
GLUCOSE-CAPILLARY: 360 mg/dL — AB (ref 65–99)
Glucose-Capillary: 205 mg/dL — ABNORMAL HIGH (ref 65–99)

## 2016-12-17 LAB — RAPID URINE DRUG SCREEN, HOSP PERFORMED
AMPHETAMINES: NOT DETECTED
BENZODIAZEPINES: NOT DETECTED
Barbiturates: NOT DETECTED
COCAINE: NOT DETECTED
OPIATES: POSITIVE — AB
TETRAHYDROCANNABINOL: NOT DETECTED

## 2016-12-17 MED ORDER — POTASSIUM CHLORIDE 10 MEQ/100ML IV SOLN
10.0000 meq | INTRAVENOUS | Status: AC
  Administered 2016-12-17 (×2): 10 meq via INTRAVENOUS
  Filled 2016-12-17: qty 100

## 2016-12-17 MED ORDER — CEFAZOLIN SODIUM-DEXTROSE 1-4 GM/50ML-% IV SOLN
1.0000 g | Freq: Three times a day (TID) | INTRAVENOUS | Status: DC
  Administered 2016-12-17: 1 g via INTRAVENOUS
  Filled 2016-12-17: qty 50

## 2016-12-17 MED ORDER — NYSTATIN 100000 UNIT/ML MT SUSP
5.0000 mL | Freq: Two times a day (BID) | OROMUCOSAL | Status: DC
  Administered 2016-12-18 – 2016-12-20 (×4): 500000 [IU] via ORAL
  Filled 2016-12-17 (×5): qty 5

## 2016-12-17 MED ORDER — POTASSIUM CHLORIDE CRYS ER 20 MEQ PO TBCR
40.0000 meq | EXTENDED_RELEASE_TABLET | Freq: Once | ORAL | Status: AC
  Administered 2016-12-17: 40 meq via ORAL
  Filled 2016-12-17: qty 2

## 2016-12-17 MED ORDER — INSULIN ASPART 100 UNIT/ML ~~LOC~~ SOLN
7.0000 [IU] | Freq: Once | SUBCUTANEOUS | Status: AC
  Administered 2016-12-17: 7 [IU] via SUBCUTANEOUS

## 2016-12-17 MED ORDER — DEXTROSE 5 % IV SOLN
3.0000 g | INTRAVENOUS | Status: DC
  Filled 2016-12-17: qty 3000

## 2016-12-17 MED ORDER — DEXTROSE-NACL 5-0.45 % IV SOLN
INTRAVENOUS | Status: DC
  Administered 2016-12-17: 05:00:00 via INTRAVENOUS

## 2016-12-17 MED ORDER — BUSPIRONE HCL 10 MG PO TABS
10.0000 mg | ORAL_TABLET | Freq: Three times a day (TID) | ORAL | Status: DC
  Administered 2016-12-17 – 2016-12-20 (×10): 10 mg via ORAL
  Filled 2016-12-17 (×10): qty 1

## 2016-12-17 MED ORDER — INSULIN ASPART 100 UNIT/ML ~~LOC~~ SOLN
0.0000 [IU] | Freq: Three times a day (TID) | SUBCUTANEOUS | Status: DC
  Administered 2016-12-17: 3 [IU] via SUBCUTANEOUS

## 2016-12-17 MED ORDER — INSULIN GLARGINE 100 UNIT/ML ~~LOC~~ SOLN
10.0000 [IU] | Freq: Every day | SUBCUTANEOUS | Status: DC
  Administered 2016-12-17 – 2016-12-19 (×3): 10 [IU] via SUBCUTANEOUS
  Filled 2016-12-17 (×3): qty 0.1

## 2016-12-17 MED ORDER — CEFAZOLIN SODIUM-DEXTROSE 2-4 GM/100ML-% IV SOLN
2.0000 g | Freq: Three times a day (TID) | INTRAVENOUS | Status: DC
  Administered 2016-12-18: 2 g via INTRAVENOUS
  Filled 2016-12-17 (×2): qty 100

## 2016-12-17 MED ORDER — MONTELUKAST SODIUM 10 MG PO TABS
10.0000 mg | ORAL_TABLET | Freq: Every day | ORAL | Status: DC
  Administered 2016-12-17 – 2016-12-19 (×3): 10 mg via ORAL
  Filled 2016-12-17 (×3): qty 1

## 2016-12-17 MED ORDER — CEFAZOLIN SODIUM-DEXTROSE 2-4 GM/100ML-% IV SOLN
2.0000 g | Freq: Three times a day (TID) | INTRAVENOUS | Status: AC
  Administered 2016-12-17 – 2016-12-18 (×3): 2 g via INTRAVENOUS
  Filled 2016-12-17 (×3): qty 100

## 2016-12-17 MED ORDER — SODIUM CHLORIDE 0.9 % IV SOLN
INTRAVENOUS | Status: DC
  Administered 2016-12-17 – 2016-12-19 (×6): via INTRAVENOUS

## 2016-12-17 MED ORDER — ENOXAPARIN SODIUM 40 MG/0.4ML ~~LOC~~ SOLN
40.0000 mg | SUBCUTANEOUS | Status: DC
  Administered 2016-12-17: 40 mg via SUBCUTANEOUS
  Filled 2016-12-17 (×2): qty 0.4

## 2016-12-17 MED ORDER — TIOTROPIUM BROMIDE MONOHYDRATE 18 MCG IN CAPS
1.0000 | ORAL_CAPSULE | Freq: Every day | RESPIRATORY_TRACT | Status: DC
  Administered 2016-12-17 – 2016-12-20 (×4): 18 ug via RESPIRATORY_TRACT
  Filled 2016-12-17 (×2): qty 5

## 2016-12-17 MED ORDER — ACETAMINOPHEN 325 MG PO TABS
650.0000 mg | ORAL_TABLET | Freq: Four times a day (QID) | ORAL | Status: DC | PRN
  Administered 2016-12-17: 650 mg via ORAL
  Filled 2016-12-17: qty 2

## 2016-12-17 MED ORDER — PANTOPRAZOLE SODIUM 40 MG PO TBEC
40.0000 mg | DELAYED_RELEASE_TABLET | Freq: Every day | ORAL | Status: DC
  Administered 2016-12-17 – 2016-12-20 (×3): 40 mg via ORAL
  Filled 2016-12-17 (×3): qty 1

## 2016-12-17 MED ORDER — POLYETHYLENE GLYCOL 3350 17 G PO PACK
17.0000 g | PACK | Freq: Every day | ORAL | Status: DC
  Administered 2016-12-19: 17 g via ORAL
  Filled 2016-12-17 (×3): qty 1

## 2016-12-17 MED ORDER — FLUTICASONE PROPIONATE 50 MCG/ACT NA SUSP
2.0000 | Freq: Two times a day (BID) | NASAL | Status: DC
  Administered 2016-12-17 – 2016-12-20 (×7): 2 via NASAL
  Filled 2016-12-17: qty 16

## 2016-12-17 MED ORDER — POTASSIUM CHLORIDE 10 MEQ/100ML IV SOLN
INTRAVENOUS | Status: AC
  Filled 2016-12-17: qty 100

## 2016-12-17 MED ORDER — INSULIN ASPART 100 UNIT/ML ~~LOC~~ SOLN: 0.0000 [IU] | SUBCUTANEOUS | Status: DC

## 2016-12-17 MED ORDER — LORATADINE 10 MG PO TABS
10.0000 mg | ORAL_TABLET | Freq: Every day | ORAL | Status: DC
  Administered 2016-12-17 – 2016-12-20 (×3): 10 mg via ORAL
  Filled 2016-12-17 (×3): qty 1

## 2016-12-17 MED ORDER — ATORVASTATIN CALCIUM 40 MG PO TABS
40.0000 mg | ORAL_TABLET | Freq: Every day | ORAL | Status: DC
  Administered 2016-12-17 – 2016-12-20 (×4): 40 mg via ORAL
  Filled 2016-12-17 (×5): qty 1

## 2016-12-17 MED ORDER — MORPHINE SULFATE (PF) 4 MG/ML IV SOLN
1.0000 mg | INTRAVENOUS | Status: DC | PRN
  Administered 2016-12-17 – 2016-12-19 (×7): 1 mg via INTRAVENOUS
  Filled 2016-12-17 (×7): qty 1

## 2016-12-17 MED ORDER — CITALOPRAM HYDROBROMIDE 20 MG PO TABS
20.0000 mg | ORAL_TABLET | Freq: Every day | ORAL | Status: DC
  Administered 2016-12-17 – 2016-12-20 (×3): 20 mg via ORAL
  Filled 2016-12-17 (×2): qty 1
  Filled 2016-12-17: qty 2

## 2016-12-17 MED ORDER — MOMETASONE FURO-FORMOTEROL FUM 200-5 MCG/ACT IN AERO
2.0000 | INHALATION_SPRAY | Freq: Two times a day (BID) | RESPIRATORY_TRACT | Status: DC
  Administered 2016-12-17 – 2016-12-20 (×7): 2 via RESPIRATORY_TRACT
  Filled 2016-12-17: qty 8.8

## 2016-12-17 MED ORDER — INSULIN ASPART 100 UNIT/ML ~~LOC~~ SOLN
0.0000 [IU] | SUBCUTANEOUS | Status: DC
  Administered 2016-12-18: 5 [IU] via SUBCUTANEOUS
  Administered 2016-12-18: 7 [IU] via SUBCUTANEOUS
  Administered 2016-12-18: 9 [IU] via SUBCUTANEOUS
  Administered 2016-12-18: 3 [IU] via SUBCUTANEOUS
  Administered 2016-12-18 – 2016-12-19 (×4): 5 [IU] via SUBCUTANEOUS
  Administered 2016-12-19: 3 [IU] via SUBCUTANEOUS
  Administered 2016-12-19: 7 [IU] via SUBCUTANEOUS
  Administered 2016-12-19: 3 [IU] via SUBCUTANEOUS
  Administered 2016-12-19: 5 [IU] via SUBCUTANEOUS
  Administered 2016-12-20: 2 [IU] via SUBCUTANEOUS
  Administered 2016-12-20: 5 [IU] via SUBCUTANEOUS
  Administered 2016-12-20: 3 [IU] via SUBCUTANEOUS

## 2016-12-17 MED ORDER — BENZONATATE 100 MG PO CAPS
100.0000 mg | ORAL_CAPSULE | Freq: Two times a day (BID) | ORAL | Status: DC | PRN
  Administered 2016-12-18: 100 mg via ORAL
  Filled 2016-12-17: qty 1

## 2016-12-17 NOTE — ED Notes (Signed)
Patient transported to X-ray 

## 2016-12-17 NOTE — ED Notes (Signed)
Insulin drip initiated and verified by Herbert Spires, RN

## 2016-12-17 NOTE — Consult Note (Signed)
ORTHOPAEDIC CONSULTATION  REQUESTING PHYSICIAN: Alveda Reasons, MD  Chief Complaint: Left foot laceration and multiple metatarsal fractures  HPI: [name] is a 51 y.o. female who presents with injury to her left foot status post fall 2 days ago.  She had progressively worsening pain with ambulation which caused her to present to the ED.  She was recently admitted for uncontrolled diabetes.  She comes in with traumatic lacerations to the plantar aspect of the third fourth and fifth toe with exposed tendon and gross contamination.  She also has multiple metatarsal fractures.  Orthopedics was consulted.  Past Medical History:  Diagnosis Date  . Arthritis 04-10-11   hips, shoulders, back  . Asthma   . Bipolar disorder ([place])   . Cancer ([place]) 1993   cervical, no treatment done, went away per pt  . Cervical dysplasia or atypia 04-10-11   '93- once dx.-got pregnant-no intervention, then postpartum, no dysplasia found  . CHF (congestive heart failure) ([place])    no cardiologist 2014 dx, none now  . 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 Pathology showed Condyloma, gluteal CONDYLOMA ACUMINATUM.   [name] Kitchen COPD (chronic obstructive pulmonary disease) ([place])   . Depression   . Diabetes mellitus without complication ([place])   . Dyspnea    with actity - better since taking Singular  . Fibrocystic breast disease   . GERD (gastroesophageal reflux disease)   . Hyperlipidemia   . Hypertension   . Migraine headache    none recent  . Neuropathy   . Skin lesion 03/15/2011   In gluteal crease now s/p removal by Dr. [name] of General Surgery on 3/12. Path shows condyloma.     . Sleep apnea 04-10-11   uses cpap, pt does not know settings  . Trigger finger    left third   Past Surgical History:  Procedure Laterality Date  . BACK SURGERY  04-10-11   x5-Lumbar fusion-retained hardware.(Dr. Louanne Skye)  . BREAST CYST EXCISION     bilateral breast, 3 cysts removed from each breast   . BREAST EXCISIONAL BIOPSY Right    x 3  . BREAST EXCISIONAL BIOPSY Left    x 3  . CARPAL TUNNEL RELEASE Bilateral   . Cervical biospy    . COLONOSCOPY WITH PROPOFOL N/A 07/06/2015   Performed by [name], MD at [facility]  . DOPPLER ECHOCARDIOGRAPHY  04/06/2012   AT [place] 55-60%  . FISTULECTOMY ANAL N/A 04/17/2011   Performed by Maia Petties., MD at [facility] ORS  . MULTIPLE TOOTH EXTRACTIONS    . NECK SURGERY  04-10-11   x3- cervical fusion with plating and screws-Dr. [name]  . RELEASE TRIGGER FINGER LEFT 3RD FINGER Left 06/28/2016   Performed by [name], MD at [place] Right 06/12/2016   Performed by [name], MD at [place]  . TRANSESOPHAGEAL ECHOCARDIOGRAM (TEE) N/A 04/06/2012   Performed by [name]., MD at [facility] ENDOSCOPY  . TRIGGER FINGER RELEASE Right    middle finger   Social History   Socioeconomic History  . Marital status: Divorced    Spouse name: None  . Number of children: None  . Years of education: None  . Highest education level: None  Social Needs  . Financial resource strain: None  . Food insecurity - worry: None  . Food insecurity - inability: None  . Transportation needs - medical: None  .  Transportation needs - non-medical: None  Occupational History  . None  Tobacco Use  . Smoking status: Current Every Day Smoker    Packs/day: 0.50    Years: 38.00    Pack years: 19.00    Types: Cigarettes    Start date: 01/28/1977  . Smokeless tobacco: Never Used  Substance and Sexual Activity  . Alcohol use: No    Alcohol/week: 0.0 oz  . Drug use: Yes    Types: Marijuana, "Crack" cocaine    Comment: hx marijuana usemany years ago, Crack -none since 11/2015  . Sexual activity: No  Other Topics Concern  . None  Social History Narrative  . None   Family History  Problem Relation Age of Onset  . Stroke Father   . Cancer Maternal Aunt        breast   - negative except otherwise stated in the  family history section Allergies  Allergen Reactions  . 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 Levofloxacin Itching  . Nicotine Rash and Other (See Comments)    Patch caused a rash   Prior to Admission medications   Medication Sig Start Date End Date Taking? Authorizing Provider  amLODipine (NORVASC) 10 MG tablet TAKE 1 TABLET ONCE DAILY FOR BLOOD PRESSURE. Patient taking differently: 10 mg daily. FOR BLOOD PRESSURE 12/10/16  Yes [name], MD  atorvastatin (LIPITOR) 40 MG tablet Take 1 tablet (40 mg total) daily by mouth. 12/11/16  Yes [name], MD  blood glucose meter kit and supplies KIT Dispense based on patient and insurance preference. Use up to four times daily as directed. (FOR ICD-9 250.00, 250.01). 12/11/16  Yes [name], MD  busPIRone (BUSPAR) 10 MG tablet Take 1 tablet (10 mg total) by mouth 3 (three) times daily. 05/03/16  Yes Vivi Barrack, MD  cetirizine (QC ALL DAY ALLERGY) 10 MG tablet TAKE 1 TABLET EACH DAY. Patient taking differently: Take 10 mg by mouth daily.  11/30/15  Yes Vivi Barrack, MD  citalopram (CELEXA) 20 MG tablet Take 1 tablet (20 mg total) daily by mouth. 12/10/16  Yes [name], MD  fluticasone (FLONASE) 50 MCG/ACT nasal spray Place 2 sprays into both nostrils 2 (two) times daily. 05/20/16  Yes Hensel, Jamal Collin, MD  gabapentin (NEURONTIN) 300 MG capsule TAKE (1) CAPSULE THREE TIMES DAILY. Patient taking differently: Take 300 mg 3 (three) times daily by mouth.  12/10/16  Yes [name], MD  hydrochlorothiazide (HYDRODIURIL) 25 MG tablet TAKE 1 TABLET EACH DAY. Patient taking differently: Take 25 mg daily by mouth.  12/10/16  Yes [name], MD  Insulin Glargine (LANTUS SOLOSTAR) 100 UNIT/ML Solostar Pen Inject 16 Units every morning into the skin. If your fasting blood sugar in the morning is greater than 300, increase your lantus dose by ONE unit. 12/15/16  Yes [name], MD  Insulin Pen Needle 29G X 5MM MISC  Check sugars TID before meals. 12/11/16  Yes [name], MD  losartan (COZAAR) 100 MG tablet Take 1 tablet (100 mg total) daily by mouth. 12/11/16  Yes [name], MD  mometasone-formoterol (DULERA) 200-5 MCG/ACT AERO Inhale 2 puffs into the lungs 2 (two) times daily. 11/23/15  Yes Vivi Barrack, MD  montelukast (SINGULAIR) 10 MG tablet TAKE 1 TABLET AT BEDTIME AS DIRECTED. Patient taking differently: Take 10 mg at bedtime by mouth. AS DIRECTED 12/11/16  Yes [name], MD  NON FORMULARY Uses a C-PAP at bedtime  Yes [provider]  nystatin (MYCOSTATIN) 100000 UNIT/ML suspension Take 15 mLs 2 (two) times daily by mouth. SWISH FOR 30 SECONDS AND SPIT   Yes [provider]  omeprazole (PRILOSEC) 20 MG capsule TAKE (1) CAPSULE DAILY. Patient taking differently: Take 20 mg daily by mouth.  12/11/16  Yes [name], MD  polyethylene glycol [facility]) packet Take 17 g by mouth daily. 09/23/16  Yes Fransico Meadow, PA-C  SPIRIVA HANDIHALER 18 MCG inhalation capsule INHALE CONTENTS OF 1 CAPSULE DAILY. 11/25/16  Yes [name], MD  VENTOLIN HFA 108 (90 Base) MCG/ACT inhaler USE 1-2 PUFFS INTO LUNGS EVERY 4 HOURS AS NEEDED FOR WHEEZING OR SHORTNESS OF BREATH. 11/29/16  Yes [name], MD   Dg Chest 2 View  Result Date: 12/17/2016 CLINICAL DATA:  Cough. Fell 2 days ago. History of hypertension, diabetes, COPD, congestive heart failure, asthma, smoker. EXAM: CHEST  2 VIEW COMPARISON:  09/23/2016 FINDINGS: Shallow inspiration. Cardiac enlargement. No vascular congestion or edema. No airspace disease or consolidation in the lungs. No blunting of costophrenic angles. No pneumothorax. Prominent soft tissue attenuation. Postoperative change in the cervical spine. IMPRESSION: Cardiac enlargement.  No evidence of active pulmonary disease. Electronically Signed   By: Lucienne Capers M.D.   On: 12/17/2016 01:12   Dg Foot Complete Left  Result Date: 12/16/2016 CLINICAL DATA:  Patient  reports falling and injuring her left foot, patient has lateral laceration to her left pinky toe. EXAM: LEFT FOOT - COMPLETE 3+ VIEW COMPARISON:  None. FINDINGS: There are multiple fractures. There are transverse fractures across the distal second, third and fourth metatarsals. The second and third metatarsal fractures are mildly comminuted. There is mild displacement in a lateral plantar direction of the distal fracture components, maximum of 3 mm. There is also fracture from the medial base of the proximal phalanx of the fifth toe. It is mildly displaced by 2 mm. There is also a nondisplaced fracture across the lateral base of the proximal phalanx of the fifth toe. There are 4 separate bony components of the medial sesamoid. This may be developmental. There are relatively sharp margins between the distal and proximal sesamoid components on the lateral view suggesting that this may reflect acute fractures. No other fractures.  No dislocation. There is forefoot soft tissue swelling. IMPRESSION: 1. Mildly displaced fractures of the distal second, third and fourth metatarsals as described. 2. Small fractures of the medial and lateral base of the proximal phalanx of the fifth toe. 3. Questionable fracture of the medial sesamoid versus multiple ossification centers. 4. No dislocation. Electronically Signed   By: Lajean Manes M.D.   On: 12/16/2016 19:18   - pertinent xrays, CT, MRI studies were reviewed and independently interpreted  Positive ROS: All other systems have been reviewed and were otherwise negative with the exception of those mentioned in the HPI and as above.  Physical Exam: General: Alert, no acute distress Cardiovascular: No pedal edema Respiratory: No cyanosis, no use of accessory musculature GI: No organomegaly, abdomen is soft and non-tender Skin: No lesions in the area of chief complaint Neurologic: Sensation intact distally Psychiatric: Patient is competent for consent with normal mood  and affect Lymphatic: No axillary or cervical lymphadenopathy  MUSCULOSKELETAL:  Traumatic lacerations on the plantar aspect of third fourth and fifth toes.  No neurovascular compromise of the foot.  There is exposed tendon of the fifth toe.  There is no gross contamination.  There is no drainage.  Assessment: Left foot traumatic lacerations with  multiple metatarsal fractures Uncontrolled diabetes  Plan: Patient will be admitted to stepdown unit for her uncontrolled diabetes.  From orthopedic standpoint she will need formal irrigation and debridement with possible fixation of her metatarsal fractures.  This was discussed with the patient and she is in agreement.  We will plan on surgery Wednesday.  N.p.o. after midnight.  Thank you for the consult and the opportunity to see Ms. Whipkey  N. Eduard Roux, MD [facility] [phone] 7:40 AM

## 2016-12-17 NOTE — ED Notes (Signed)
Patient placed on O2 at 1L for O2 sat in the 80s, sat improved to 97%, nad

## 2016-12-17 NOTE — ED Notes (Signed)
Breakfast tray at bedside 

## 2016-12-17 NOTE — Discharge Summary (Signed)
[facility] Discharge Summary  Patient name: [name] Medical record number: [id number] Date of birth: [birth year] Age: 51 y.o. Gender: female Date of Admission: 12/16/2016  Date of Discharge: 12/20/2016 Admitting Physician: Alveda Reasons, MD  Primary Care Provider: [name], MD Consultants: Orthopedic Surgery   Indication for Hospitalization: HHS, multiple left foot fractures     Discharge Diagnoses/Problem List:  Hyperglycemic Hyperosmolar State, resolved Displaced fractures of the distal second, third and fourth metatarsals,  Small fractures of the medial and lateral base of the proximal phalanx of the fifth toe  ?fracture of the medial sesamoid Left 3-5th plantar toe laceration s/p irrigation and debridement DMT2 AKI on CKD3 COPD OSA HFpEF Oral Thrush HLD HTN  Disposition: Home with HHPT and Diabetes Management Nursing  Discharge Condition: Improved  Discharge Exam:  General: NAD, resting in bed, morbidly obese AA female Cardiovascular: rrr, no mrg Respiratory: diminished breath sounds likely due to obesity, no crackles, normal work of breathing Abdomen: soft, nontender, nondistended Extremities: left foot in Cam Walker, able to move lower extremities.   Brief Hospital Course:  HHS/Uncontrolled Diabetes Patient presented to the ED, s/p fall with multiple fractures to her foot as evidenced by foot xray. She was also found to be profoundly hyperglycemic with CBGs in and meeting criteria for HHS. She received aggressive rehydration with IV fluids and was started on an insulin drip. CBGs corrected into the 200-300s and patient was transitioned to basal insulin (Lantus) and sliding scale insulin. Patient achieved good control of CBGs. Otherwise, she remained hemodynamically stable during her hospitalization. Of note patient was recently hospitalized 4 days prior for HHS. Patient endorsed not having a glucometer at home despite being  discharged with orders for one. On discharge for this current hospitalization, patient received diabetes education from a diabetic coordinator. She also was given a written prescription for a glucometer.   Multiple left foot fractures and laceration Patient presented to the ED, s/p fall with multiple fractures to her foot as evidenced by foot xray. Patient was started on Ancef in the ED and evaluated by surgery. She received surgery s/p correction of her glucose. After surgery, pain control was maintained adequately with PO pain. She maintained adequate UOP and endorsed passing flatus.   Issues for Follow Up:  1. Left foot wound - Orthopedics recommended leaving dressing in place until outpatient follow up. Patient can ambulate with CAM boot walker.  2. Diabetes Management - Please ensure patient is adequately checking CBGs and using insulin properly 3. Please endure patient completes full course of antibiotics after discharge.   Significant Procedures:  12/18/2016 - Irrigation and Debridement of left foot   Significant Labs and Imaging:  Recent Labs  Lab 12/18/16 0430 12/19/16 0425 12/20/16 0547  WBC 10.7* 10.8* 9.2  HGB 15.0 13.9 13.1  HCT 41.6 38.7 37.2  PLT 278 254 252   Recent Labs  Lab 12/17/16 0357 12/17/16 1308 12/18/16 0430 12/19/16 0425 12/20/16 0547  NA 132* 135 133* 135 135  K 3.4* 3.5 3.8 4.2 3.8  CL 97* 100* 102 105 105  CO2 23 21* 21* 23 23  GLUCOSE 361* 158* 255* 296* 256*  BUN 23* 23* _0 CREATININE 2.24* 1.95* 1.53* 1.40* 1.41*  CALCIUM 8.7* 8.7* 8.4* 8.1* 8.4*      Results/Tests Pending at Time of Discharge: None  Discharge Medications:  Allergies as of 12/20/2016      Reactions   Methadone Hcl Other (See Comments)    "  blacked out" in 1990's   Levofloxacin Itching   Nicotine Rash, Other (See Comments)   Patch caused a rash      Medication List    TAKE these medications   acetaminophen 325 MG tablet Commonly known as:  TYLENOL Take  2 tablets (650 mg total) by mouth every 6 (six) hours as needed for mild pain.   amLODipine 10 MG tablet Commonly known as:  NORVASC TAKE 1 TABLET ONCE DAILY FOR BLOOD PRESSURE. What changed:    how much to take  when to take this  additional instructions   atorvastatin 40 MG tablet Commonly known as:  LIPITOR Take 1 tablet (40 mg total) daily by mouth.   blood glucose meter kit and supplies Kit Dispense based on patient and insurance preference. Use up to four times daily as directed. (FOR ICD-9 250.00, 250.01).   busPIRone 10 MG tablet Commonly known as:  BUSPAR Take 1 tablet (10 mg total) by mouth 3 (three) times daily.   cetirizine 10 MG tablet Commonly known as:  QC ALL DAY ALLERGY TAKE 1 TABLET EACH DAY. What changed:    how much to take  how to take this  when to take this  additional instructions   citalopram 20 MG tablet Commonly known as:  CELEXA Take 1 tablet (20 mg total) daily by mouth.   fluticasone 50 MCG/ACT nasal spray Commonly known as:  FLONASE Place 2 sprays into both nostrils 2 (two) times daily.   gabapentin 300 MG capsule Commonly known as:  NEURONTIN TAKE (1) CAPSULE THREE TIMES DAILY. What changed:    how much to take  how to take this  when to take this  additional instructions   hydrochlorothiazide 25 MG tablet Commonly known as:  HYDRODIURIL TAKE 1 TABLET EACH DAY. What changed:    how much to take  how to take this  when to take this  additional instructions   HYDROcodone-acetaminophen 7.5-325 MG tablet Commonly known as:  NORCO Take 1-2 tablets by mouth every 4 (four) hours as needed for moderate pain.   Insulin Glargine 100 UNIT/ML Solostar Pen Commonly known as:  LANTUS SOLOSTAR Inject 16 Units every morning into the skin. If your fasting blood sugar in the morning is greater than 300, increase your lantus dose by ONE unit.   Insulin Pen Needle 29G X 5MM Misc Check sugars TID before meals.   losartan 100  MG tablet Commonly known as:  COZAAR Take 1 tablet (100 mg total) daily by mouth.   mometasone-formoterol 200-5 MCG/ACT Aero Commonly known as:  DULERA Inhale 2 puffs into the lungs 2 (two) times daily.   montelukast 10 MG tablet Commonly known as:  SINGULAIR TAKE 1 TABLET AT BEDTIME AS DIRECTED. What changed:    how much to take  how to take this  when to take this  additional instructions   NON FORMULARY Uses a C-PAP at bedtime   nystatin 100000 UNIT/ML suspension Commonly known as:  MYCOSTATIN Take 15 mLs 2 (two) times daily by mouth. SWISH FOR 30 SECONDS AND SPIT   omeprazole 20 MG capsule Commonly known as:  PRILOSEC TAKE (1) CAPSULE DAILY. What changed:    how much to take  how to take this  when to take this  additional instructions   polyethylene glycol packet Commonly known as:  MIRALAX Take 17 g by mouth daily.   SPIRIVA HANDIHALER 18 MCG inhalation capsule Generic drug:  tiotropium INHALE CONTENTS OF 1 CAPSULE DAILY.  sulfamethoxazole-trimethoprim 800-160 MG tablet Commonly known as:  BACTRIM DS,SEPTRA DS Take 1 tablet by mouth 2 (two) times daily.   VENTOLIN HFA 108 (90 Base) MCG/ACT inhaler Generic drug:  albuterol USE 1-2 PUFFS INTO LUNGS EVERY 4 HOURS AS NEEDED FOR WHEEZING OR SHORTNESS OF BREATH.       Discharge Instructions: Please refer to Patient Instructions section of EMR for full details.  Patient was counseled important signs and symptoms that should prompt return to medical care, changes in medications, dietary instructions, activity restrictions, and follow up appointments.   Follow-Up Appointments: Follow-up Information    [name], MD Follow up in 1 week(s).   Specialty:  Orthopedic Surgery Why:  For wound re-check Contact information: [place] [address] Talty, Delta, DO. Go on 12/27/2016.   Specialty:  Family Medicine Why:  Go to appointment at 08:45AM.  Please arrive 15 minutes early.  Contact information: [address] [address] [phone]        [facility] Follow up.   Specialty:  Home Health Services Why:  Home health nurse and physical therapist to follow up with you at home.   Contact information: [address] [place] [address] [phone]           Bonnita Hollow, MD 12/20/2016, 5:03 PM PGY-1, [place]

## 2016-12-17 NOTE — Progress Notes (Signed)
Patient placed on CPAP at auto mode and tolerating well. Full face mask and patient is doing well. 2L [place] o2 bleed in

## 2016-12-17 NOTE — Telephone Encounter (Signed)
Contacted pharmacy to verify pt had picked up this Rx and stated she had picked it up on 12/11/16. Katharina Caper, April D, [place]

## 2016-12-17 NOTE — ED Notes (Signed)
Pt provided sprite zero per her request

## 2016-12-17 NOTE — Progress Notes (Signed)
Family Medicine Teaching Service Daily Progress Note Intern Pager: [phone]  Patient name: [name] Medical record number: [id number] Date of birth: 20-Oct-1965 Age: 51 y.o. Gender: female  Primary Care Provider: [name], MD Consultants: Orthopedics Code Status: Full  Pt Overview and Major Events to Date:  LYNNA ZAMORANO is a 51 y.o. female presenting with left foot laceration s/p fall. PMH is significant for T2DM, COPD, morbid obesity, HLD, HTN, CKD3, HFpEF  Assessment and Plan:  Hyperglycemia concerning for QQP:[id number] with HHS on admission with hyperglycemia >600, serum bicarbonate >18, 5 ketones. UA negative for UTI. CBGs correcting well on  insulin gtt. > 600 >> 361 >> 162. lantus 16 units qhs at home. A1C 9.3. Plan to transition to subcutaneous lantus 10 units + SSi. Likely will need to titrate to home dose of lantus 16 units.  - decrease insulin gtt, add lantus 10 units - SSi -recheck BMP in PM - ensure patient has glucometer on discharge  Laceration of left foot with multiple fractures Orthopedics plan for surgery tomorrow.  - appreciate orthopedics recommendations - cont Ancef per Ortho recommendations (11/19 - )  - order PT/OT consult after surgery - Tylenol and morphine for pain  Elevated white blood cell count/elevated hemoglobin: Improved 16.2 > 13.7. Baseline 11-13. Likely due to hemoconcentration in the setting of significant hyperglycemia.  Chest x-ray is negative for infection.  No other signs or symptoms of infection noted -Monitor with labs  Hyponatremia: likely due to hyperglycemia. Corrected Na 136. Should improve with treatment of hyperglycemia.  AKI on CKD3. - Improved Cr On admit 2.63 > 2.24. baseline1.4-1.7. [name] Kitchen Likely due to dehydration in the setting of HHS.  -avoid nephrotoxic medications -held gabapentin/losartin/hctz -monitor Cr  COPD- satting 97%+ on 2L [place]. no current SOB complaints, Patient did not endorse any SOB, but did have 1  episode of desaturation down to 83%. Nasal canula 2L O2 was started. Likely obesity hypoventilation syndrome given BMI/body habitus and de. Does not use oxygen at home. Smokes 1 pack of cigarettes a day -home ventolin -home singulair -home spiriva -Continue Dulera supplemental O2 to maintain saturation > 90% -wean from O2 as tolearted  OSA: Patient reports history of using CPAP - monitor respiratory status - CPAP order placed - consider outpatient referral for sleep study  HFpEF 55-60%- from echo 6 months ago. No crackles on lung exam.  -will monitor for fluid overload given significant rehydration involved with HHS protocol - CXR  Oral Thrush. Patient presented with white plaque on tongue. Has a h/o thrush treated with nystatin rinse at home - continue oral rinse - HIV screen pending.   HTN- Hypotensive. BP since admit range 93-136/52-97. Last reading 101/55.  -holdhome BP meds temp to avoid hypotension  FEN/GI: Carb modified diet. NPO after midnight Prophylaxis: lovenox  Disposition: surgery tomorrow, pending correction of glucose  Subjective:  Patient has pain in her foot. No other complaints  Objective: Temp:  [97.6 F (36.4 C)-98.1 F (36.7 C)] 97.6 F (36.4 C) (11/20 0327) Pulse Rate:  [75-107] 82 (11/20 0630) Resp:  [16-24] 24 (11/20 0630) BP: (93-136)/(52-97) 103/54 (11/20 0630) SpO2:  [83 %-100 %] 95 % (11/20 0630) Weight:  [370 lb (167.8 kg)] 370 lb (167.8 kg) (11/19 1639) Physical Exam: General: NAD, resting in bed, morbidly obese AA female Cardiovascular: rrg, no mrg Respiratory: diminished breath sounds likely due to obesity, no crackles, normal work of breathing, 2L [place] Abdomen: soft, nontender, nondistended Extremities: left foot wrapped, no lower extremity edema  Laboratory: Recent Labs  Lab 12/12/16 1459 12/16/16 2218 12/17/16 0357  WBC 11.3* 16.2* 13.7*  HGB 15.8* 17.2* 16.4*  HCT 43.0 46.7* 44.8  PLT 330 322 290   Recent Labs  Lab  12/13/16 1006 12/16/16 2218 12/17/16 0357  NA 132* 125* 132*  K 4.6 4.1 3.4*  CL 101 89* 97*  CO2 21* 19* 23  BUN 15 21* 23*  CREATININE 1.42* 2.63* 2.24*  CALCIUM 8.6* 9.2 8.7*  GLUCOSE 316* 600* 361*    Bonnita Hollow, MD 12/17/2016, 7:19 AM PGY-1, [place] Intern pager: [phone], text pages welcome

## 2016-12-17 NOTE — Progress Notes (Addendum)
Pharmacy Antibiotic Note  [name] is a 51 y.o. female admitted on 12/16/2016 with foot laceration/fracture. Pharmacy has been consulted for cefazolin dosing per ortho recommendations.   WBC 16.2, LA 1.44, and afebrile. SCr 2.63 (BL 1.4-1.7) for nCrCl ~ 30-35 mL/min.   Patient received cefazolin 2g IV x1 in the ED.   Plan: Cefazolin 1g IV q8hr  Monitor renal function, clinical picture, and culture data F/u surgical plan and length of therapy   Height: 5\' 10"  (177.8 cm) Weight: (!) 370 lb (167.8 kg) IBW/kg (Calculated) : 68.5  Temp (24hrs), Avg:98.1 F (36.7 C), Min:98.1 F (36.7 C), Max:98.1 F (36.7 C)  Recent Labs  Lab 12/12/16 1459 12/12/16 1832 12/12/16 2248 12/13/16 0504 12/13/16 1006 12/16/16 2218 12/17/16 0027  WBC 11.3*  --   --   --   --  16.2*  --   CREATININE 2.33* 2.24* 1.84* 1.62* 1.42* 2.63*  --   LATICACIDVEN  --   --   --   --   --   --  1.44    Estimated Creatinine Clearance: 42.3 mL/min (A) (by C-G formula based on SCr of 2.63 mg/dL (H)).    Allergies  Allergen Reactions  . 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 Levofloxacin Itching  . Nicotine Rash and Other (See Comments)    Patch caused a rash   Antimicrobials this admission: 11/20 Ancef >>   Microbiology results: pending   Lavonda Jumbo, PharmD Clinical Pharmacist 12/17/16 1:55 AM

## 2016-12-18 ENCOUNTER — Encounter (HOSPITAL_COMMUNITY)
Admission: EM | Disposition: A | Discharge status: Home Health | Payer: Self-pay | Source: Home / Self Care | Attending: Family Medicine

## 2016-12-18 ENCOUNTER — Inpatient Hospital Stay (HOSPITAL_COMMUNITY): Payer: Medicare Other | Admitting: Certified Registered Nurse Anesthetist

## 2016-12-18 ENCOUNTER — Encounter (HOSPITAL_COMMUNITY): Payer: Self-pay | Admitting: Certified Registered Nurse Anesthetist

## 2016-12-18 ENCOUNTER — Inpatient Hospital Stay: Payer: Medicare Other | Admitting: Student

## 2016-12-18 DIAGNOSIS — G4733 Obstructive sleep apnea (adult) (pediatric): Secondary | ICD-10-CM

## 2016-12-18 DIAGNOSIS — S92511B Displaced fracture of proximal phalanx of right lesser toe(s), initial encounter for open fracture: Secondary | ICD-10-CM | POA: Diagnosis not present

## 2016-12-18 DIAGNOSIS — S92502B Displaced unspecified fracture of left lesser toe(s), initial encounter for open fracture: Secondary | ICD-10-CM | POA: Diagnosis not present

## 2016-12-18 DIAGNOSIS — S92322A Displaced fracture of second metatarsal bone, left foot, initial encounter for closed fracture: Secondary | ICD-10-CM | POA: Diagnosis not present

## 2016-12-18 DIAGNOSIS — E1122 Type 2 diabetes mellitus with diabetic chronic kidney disease: Secondary | ICD-10-CM

## 2016-12-18 DIAGNOSIS — S92902D Unspecified fracture of left foot, subsequent encounter for fracture with routine healing: Secondary | ICD-10-CM

## 2016-12-18 DIAGNOSIS — E870 Hyperosmolality and hypernatremia: Secondary | ICD-10-CM

## 2016-12-18 DIAGNOSIS — S92332A Displaced fracture of third metatarsal bone, left foot, initial encounter for closed fracture: Secondary | ICD-10-CM | POA: Diagnosis not present

## 2016-12-18 DIAGNOSIS — S91312A Laceration without foreign body, left foot, initial encounter: Secondary | ICD-10-CM

## 2016-12-18 DIAGNOSIS — S92342A Displaced fracture of fourth metatarsal bone, left foot, initial encounter for closed fracture: Secondary | ICD-10-CM | POA: Diagnosis not present

## 2016-12-18 DIAGNOSIS — N183 Chronic kidney disease, stage 3 (moderate): Secondary | ICD-10-CM

## 2016-12-18 DIAGNOSIS — Z794 Long term (current) use of insulin: Secondary | ICD-10-CM

## 2016-12-18 HISTORY — PX: I & D EXTREMITY: SHX5045

## 2016-12-18 LAB — CBC
HCT: 41.6 % (ref 36.0–46.0)
HEMOGLOBIN: 15 g/dL (ref 12.0–15.0)
MCH: 28.8 pg (ref 26.0–34.0)
MCHC: 36.1 g/dL — ABNORMAL HIGH (ref 30.0–36.0)
MCV: 80 fL (ref 78.0–100.0)
Platelets: 278 10*3/uL (ref 150–400)
RBC: 5.2 MIL/uL — ABNORMAL HIGH (ref 3.87–5.11)
RDW: 13.6 % (ref 11.5–15.5)
WBC: 10.7 10*3/uL — ABNORMAL HIGH (ref 4.0–10.5)

## 2016-12-18 LAB — GLUCOSE, CAPILLARY
GLUCOSE-CAPILLARY: 266 mg/dL — AB (ref 65–99)
GLUCOSE-CAPILLARY: 315 mg/dL — AB (ref 65–99)
GLUCOSE-CAPILLARY: 252 mg/dL — AB (ref 65–99)
Glucose-Capillary: 212 mg/dL — ABNORMAL HIGH (ref 65–99)
Glucose-Capillary: 258 mg/dL — ABNORMAL HIGH (ref 65–99)
Glucose-Capillary: 282 mg/dL — ABNORMAL HIGH (ref 65–99)
Glucose-Capillary: 370 mg/dL — ABNORMAL HIGH (ref 65–99)

## 2016-12-18 LAB — BASIC METABOLIC PANEL
Anion gap: 10 (ref 5–15)
BUN: 17 mg/dL (ref 6–20)
CO2: 21 mmol/L — ABNORMAL LOW (ref 22–32)
Calcium: 8.4 mg/dL — ABNORMAL LOW (ref 8.9–10.3)
Chloride: 102 mmol/L (ref 101–111)
Creatinine, Ser: 1.53 mg/dL — ABNORMAL HIGH (ref 0.44–1.00)
GFR calc Af Amer: 44 mL/min — ABNORMAL LOW (ref >60)
GFR, EST NON AFRICAN AMERICAN: 38 mL/min — AB (ref >60)
Glucose, Bld: 255 mg/dL — ABNORMAL HIGH (ref 65–99)
POTASSIUM: 3.8 mmol/L (ref 3.5–5.1)
SODIUM: 133 mmol/L — AB (ref 135–145)

## 2016-12-18 LAB — HIV ANTIBODY (ROUTINE TESTING W REFLEX): HIV SCREEN 4TH GENERATION: NONREACTIVE

## 2016-12-18 LAB — SURGICAL PCR SCREEN
MRSA, PCR: NEGATIVE
STAPHYLOCOCCUS AUREUS: NEGATIVE

## 2016-12-18 SURGERY — IRRIGATION AND DEBRIDEMENT EXTREMITY
Anesthesia: General | Laterality: Left

## 2016-12-18 MED ORDER — SULFAMETHOXAZOLE-TRIMETHOPRIM 800-160 MG PO TABS
1.0000 | ORAL_TABLET | Freq: Two times a day (BID) | ORAL | Status: DC
  Administered 2016-12-18 – 2016-12-20 (×4): 1 via ORAL
  Filled 2016-12-18 (×5): qty 1

## 2016-12-18 MED ORDER — SODIUM CHLORIDE 0.9 % IR SOLN
Status: DC | PRN
  Administered 2016-12-18 (×2): 3000 mL

## 2016-12-18 MED ORDER — LACTATED RINGERS IV SOLN
INTRAVENOUS | Status: DC
  Administered 2016-12-18: 13:00:00 via INTRAVENOUS

## 2016-12-18 MED ORDER — LIDOCAINE 2% (20 MG/ML) 5 ML SYRINGE
INTRAMUSCULAR | Status: AC
  Filled 2016-12-18: qty 10

## 2016-12-18 MED ORDER — MUPIROCIN 2 % EX OINT
TOPICAL_OINTMENT | CUTANEOUS | Status: AC
  Filled 2016-12-18: qty 22

## 2016-12-18 MED ORDER — MUPIROCIN 2 % EX OINT
TOPICAL_OINTMENT | CUTANEOUS | Status: DC | PRN
  Administered 2016-12-18: 1 via TOPICAL

## 2016-12-18 MED ORDER — ONDANSETRON HCL 4 MG/2ML IJ SOLN
INTRAMUSCULAR | Status: AC
  Filled 2016-12-18: qty 4

## 2016-12-18 MED ORDER — ONDANSETRON HCL 4 MG/2ML IJ SOLN
INTRAMUSCULAR | Status: DC | PRN
  Administered 2016-12-18: 4 mg via INTRAVENOUS

## 2016-12-18 MED ORDER — GABAPENTIN 300 MG PO CAPS
300.0000 mg | ORAL_CAPSULE | Freq: Three times a day (TID) | ORAL | Status: DC
  Administered 2016-12-18 – 2016-12-20 (×7): 300 mg via ORAL
  Filled 2016-12-18 (×7): qty 1

## 2016-12-18 MED ORDER — 0.9 % SODIUM CHLORIDE (POUR BTL) OPTIME
TOPICAL | Status: DC | PRN
  Administered 2016-12-18: 1000 mL

## 2016-12-18 MED ORDER — MIDAZOLAM HCL 2 MG/2ML IJ SOLN
INTRAMUSCULAR | Status: AC
  Filled 2016-12-18: qty 2

## 2016-12-18 MED ORDER — SULFAMETHOXAZOLE-TRIMETHOPRIM 800-160 MG PO TABS: 1.0000 | ORAL_TABLET | Freq: Two times a day (BID) | ORAL | 0 refills | Status: DC

## 2016-12-18 MED ORDER — PROPOFOL 10 MG/ML IV BOLUS
INTRAVENOUS | Status: DC | PRN
  Administered 2016-12-18: 50 mg via INTRAVENOUS
  Administered 2016-12-18: 200 mg via INTRAVENOUS

## 2016-12-18 MED ORDER — FENTANYL CITRATE (PF) 250 MCG/5ML IJ SOLN
INTRAMUSCULAR | Status: AC
  Filled 2016-12-18: qty 5

## 2016-12-18 MED ORDER — INSULIN ASPART 100 UNIT/ML ~~LOC~~ SOLN
5.0000 [IU] | Freq: Once | SUBCUTANEOUS | Status: AC
  Administered 2016-12-18: 5 [IU] via SUBCUTANEOUS

## 2016-12-18 MED ORDER — HYDROCODONE-ACETAMINOPHEN 7.5-325 MG PO TABS
1.0000 | ORAL_TABLET | ORAL | Status: DC | PRN
  Administered 2016-12-18 – 2016-12-20 (×6): 2 via ORAL
  Administered 2016-12-20 (×4): 1 via ORAL
  Filled 2016-12-18: qty 2
  Filled 2016-12-18 (×2): qty 1
  Filled 2016-12-18: qty 2
  Filled 2016-12-18: qty 1
  Filled 2016-12-18: qty 2
  Filled 2016-12-18: qty 1
  Filled 2016-12-18 (×2): qty 2

## 2016-12-18 MED ORDER — FENTANYL CITRATE (PF) 250 MCG/5ML IJ SOLN
INTRAMUSCULAR | Status: DC | PRN
  Administered 2016-12-18: 50 ug via INTRAVENOUS
  Administered 2016-12-18: 100 ug via INTRAVENOUS
  Administered 2016-12-18 (×2): 50 ug via INTRAVENOUS
  Administered 2016-12-18: 100 ug via INTRAVENOUS

## 2016-12-18 MED ORDER — PROPOFOL 10 MG/ML IV BOLUS
INTRAVENOUS | Status: AC
  Filled 2016-12-18: qty 20

## 2016-12-18 MED ORDER — LIDOCAINE HCL (CARDIAC) 20 MG/ML IV SOLN
INTRAVENOUS | Status: DC | PRN
  Administered 2016-12-18: 100 mg via INTRAVENOUS

## 2016-12-18 MED ORDER — HYDROCODONE-ACETAMINOPHEN 7.5-325 MG PO TABS
ORAL_TABLET | ORAL | Status: AC
  Administered 2016-12-18: 2 via ORAL
  Filled 2016-12-18: qty 2

## 2016-12-18 MED ORDER — DEXAMETHASONE SODIUM PHOSPHATE 10 MG/ML IJ SOLN
INTRAMUSCULAR | Status: AC
  Filled 2016-12-18: qty 2

## 2016-12-18 MED ORDER — FENTANYL CITRATE (PF) 100 MCG/2ML IJ SOLN
INTRAMUSCULAR | Status: AC
  Administered 2016-12-18: 50 ug via INTRAVENOUS
  Filled 2016-12-18: qty 2

## 2016-12-18 MED ORDER — SUCCINYLCHOLINE CHLORIDE 200 MG/10ML IV SOSY
PREFILLED_SYRINGE | INTRAVENOUS | Status: AC
  Filled 2016-12-18: qty 20

## 2016-12-18 MED ORDER — SUCCINYLCHOLINE CHLORIDE 20 MG/ML IJ SOLN
INTRAMUSCULAR | Status: DC | PRN
  Administered 2016-12-18: 140 mg via INTRAVENOUS

## 2016-12-18 MED ORDER — FENTANYL CITRATE (PF) 100 MCG/2ML IJ SOLN
25.0000 ug | INTRAMUSCULAR | Status: DC | PRN
  Administered 2016-12-18 (×2): 50 ug via INTRAVENOUS

## 2016-12-18 SURGICAL SUPPLY — 53 items
BANDAGE ACE 3X5.8 VEL STRL LF (GAUZE/BANDAGES/DRESSINGS) IMPLANT
BANDAGE COBAN STERILE 2 (GAUZE/BANDAGES/DRESSINGS) ×1 IMPLANT
BNDG COHESIVE 1X5 TAN STRL LF (GAUZE/BANDAGES/DRESSINGS) ×1 IMPLANT
BNDG COHESIVE 4X5 TAN STRL (GAUZE/BANDAGES/DRESSINGS) ×2 IMPLANT
BNDG COHESIVE 6X5 TAN STRL LF (GAUZE/BANDAGES/DRESSINGS) ×4 IMPLANT
BNDG CONFORM 3 STRL LF (GAUZE/BANDAGES/DRESSINGS) ×2 IMPLANT
BNDG GAUZE ELAST 4 BULKY (GAUZE/BANDAGES/DRESSINGS) ×1 IMPLANT
BNDG GAUZE STRTCH 6 (GAUZE/BANDAGES/DRESSINGS) ×2 IMPLANT
CORDS BIPOLAR (ELECTRODE) IMPLANT
COVER SURGICAL LIGHT HANDLE (MISCELLANEOUS) ×2 IMPLANT
CUFF TOURNIQUET SINGLE 24IN (TOURNIQUET CUFF) IMPLANT
CUFF TOURNIQUET SINGLE 34IN LL (TOURNIQUET CUFF) IMPLANT
CUFF TOURNIQUET SINGLE 44IN (TOURNIQUET CUFF) IMPLANT
DRAPE INCISE IOBAN 66X45 STRL (DRAPES) ×1 IMPLANT
DRAPE U-SHAPE 47X51 STRL (DRAPES) ×2 IMPLANT
DRSG ADAPTIC 3X8 NADH LF (GAUZE/BANDAGES/DRESSINGS) ×1 IMPLANT
DURAPREP 26ML APPLICATOR (WOUND CARE) ×2 IMPLANT
ELECT REM PT RETURN 9FT ADLT (ELECTROSURGICAL)
ELECTRODE REM PT RTRN 9FT ADLT (ELECTROSURGICAL) IMPLANT
GAUZE SPONGE 4X4 12PLY STRL (GAUZE/BANDAGES/DRESSINGS) ×3 IMPLANT
GAUZE XEROFORM 5X9 LF (GAUZE/BANDAGES/DRESSINGS) ×1 IMPLANT
GLOVE SKINSENSE NS SZ7.5 (GLOVE) ×2
GLOVE SKINSENSE STRL SZ7.5 (GLOVE) ×2 IMPLANT
GLOVE SURG SYN 7.5  E (GLOVE) ×2
GLOVE SURG SYN 7.5 E (GLOVE) ×2 IMPLANT
GLOVE SURG SYN 7.5 PF PI (GLOVE) IMPLANT
GOWN STRL REIN XL XLG (GOWN DISPOSABLE) ×4 IMPLANT
HANDPIECE INTERPULSE COAX TIP (DISPOSABLE)
KIT BASIN OR (CUSTOM PROCEDURE TRAY) ×3 IMPLANT
KIT ROOM TURNOVER OR (KITS) ×2 IMPLANT
MANIFOLD NEPTUNE II (INSTRUMENTS) ×2 IMPLANT
PACK ORTHO EXTREMITY (CUSTOM PROCEDURE TRAY) ×2 IMPLANT
PAD ABD 8X10 STRL (GAUZE/BANDAGES/DRESSINGS) ×1 IMPLANT
PAD ARMBOARD 7.5X6 YLW CONV (MISCELLANEOUS) ×4 IMPLANT
PADDING CAST ABS 4INX4YD NS (CAST SUPPLIES) ×1
PADDING CAST ABS COTTON 4X4 ST (CAST SUPPLIES) ×1 IMPLANT
PADDING CAST COTTON 6X4 STRL (CAST SUPPLIES) ×2 IMPLANT
SET HNDPC FAN SPRY TIP SCT (DISPOSABLE) IMPLANT
SPONGE LAP 18X18 X RAY DECT (DISPOSABLE) ×2 IMPLANT
STOCKINETTE IMPERVIOUS 9X36 MD (GAUZE/BANDAGES/DRESSINGS) ×2 IMPLANT
SUT ETHILON 2 0 FS 18 (SUTURE) ×6 IMPLANT
SUT ETHILON 2 0 PSLX (SUTURE) ×2 IMPLANT
SUT ETHILON 3 0 PS 1 (SUTURE) ×1 IMPLANT
SUT VIC AB 2-0 FS1 27 (SUTURE) ×4 IMPLANT
SWAB CULTURE ESWAB REG 1ML (MISCELLANEOUS) IMPLANT
TOWEL OR 17X24 6PK STRL BLUE (TOWEL DISPOSABLE) ×2 IMPLANT
TOWEL OR 17X26 10 PK STRL BLUE (TOWEL DISPOSABLE) ×2 IMPLANT
TUBE CONNECTING 12X1/4 (SUCTIONS) ×2 IMPLANT
TUBE FEEDING ENTERAL 5FR 16IN (TUBING) IMPLANT
TUBING CYSTO DISP (UROLOGICAL SUPPLIES) ×3 IMPLANT
UNDERPAD 30X30 (UNDERPADS AND DIAPERS) ×4 IMPLANT
WATER STERILE IRR 1000ML POUR (IV SOLUTION) ×2 IMPLANT
YANKAUER SUCT BULB TIP NO VENT (SUCTIONS) ×2 IMPLANT

## 2016-12-18 NOTE — Plan of Care (Signed)
  Education: Knowledge of General Education information will improve 12/18/2016 0248 - Progressing by Anson Fret, RN Note POC reviewed with pt.; pt. aware of surgery for 11/21 and knows that she's NPO at [place].

## 2016-12-18 NOTE — Anesthesia Postprocedure Evaluation (Signed)
Anesthesia Post Note  Patient: [name]  Procedure(s) Performed: IRRIGATION AND DEBRIDEMENT LEFT FOOT, CLOSURE (Left )     Patient location during evaluation: PACU Anesthesia Type: General Level of consciousness: awake and alert Pain management: pain level controlled Vital Signs Assessment: post-procedure vital signs reviewed and stable Respiratory status: spontaneous breathing, nonlabored ventilation and respiratory function stable Cardiovascular status: blood pressure returned to baseline and stable Postop Assessment: no apparent nausea or vomiting Anesthetic complications: no    Last Vitals:  Vitals:   12/18/16 1445 12/18/16 1500  BP: (!) 140/109 (!) 157/98  Pulse: 78 91  Resp: 16 18  Temp:    SpO2: 99% 97%    Last Pain:  Vitals:   12/18/16 0928  TempSrc: Oral  PainSc:                  [name]

## 2016-12-18 NOTE — Progress Notes (Signed)
Family Medicine Teaching Service Daily Progress Note Intern Pager: [phone]  Patient name: [name] Medical record number: [id number] Date of birth: December 17, 1965 Age: 51 y.o. Gender: female  Primary Care Provider: [name], MD Consultants: Orthopedics Code Status: Full  Pt Overview and Major Events to Date:  [name] is a 51 y.o. female presenting with left foot laceration s/p fall. PMH is significant for T2DM, COPD, morbid obesity, HLD, HTN, CKD3, HFpEF  11/21 - Planned orthopedic debridement  Assessment and Plan:  Hyperglycemia concerning for PFX:[id number]. Transitioned onto subq insulin 10 units + SSI. CBGs elevated ~250. Patient is currently NPO awaiting surgery.  Likely will need to titrate to home dose of lantus 16 units. Will need diet replaced after surgery. Currently on NS 150 mL/hr. Can transion to D51/2NS if patient CBGs < 200.  - lantus 10 units - SSi - ensure patient has glucometer on discharge - restart gabapentin due to improved renal function  Laceration of left foot with multiple fractures Orthopedics plan for surgery today.  - appreciate orthopedics recommendations - cont Ancef per Ortho recommendations (11/19 - )  - order PT/OT consult after surgery - Tylenol and morphine for pain  Elevated white blood cell count/elevated hemoglobin: Improved 16.2 > 13.7 > 10.7. Baseline 11-13.  Chest x-ray is negative for infection.  No other signs or symptoms of infection noted -Monitor with labs  Hyponatremia: likely due to hyperglycemia. Corrected to nl due to hypoglycemia Na 136. Should improve with treatment of hyperglycemia. - cont to monitor w/ daily bmp  AKI on CKD3. - Improved, to baseline at 1.53. baseline1.4-1.7.  -holding losartan due to hypotension -losartin/hctz -monitor Cr  COPD- satting 97%+ on 2L [place]. no current SOB complaints, Patient did not endorse any SOB, but did have 1 episode of desaturation down to 83%. Nasal canula 2L O2 was  started. Likely obesity hypoventilation syndrome given BMI/body habitus and de. Does not use oxygen at home. Smokes 1 pack of cigarettes a day -home ventolin -home singulair -home spiriva -Continue Dulera supplemental O2 to maintain saturation > 90% -wean from O2 as tolearted  OSA: Patient reports history of using CPAP. Bipap listed in chart overnight, but note says CPAP. I believe this a charting error listing Bipap instead of CPAP - monitor respiratory status - CPAP qhs - consider outpatient referral for sleep study  HFpEF 55-60%- from echo 6 months ago. No s/s of fluid overload.  -will monitor for fluid overload given significant rehydration involved with HHS protocol  Oral Thrush. Patient presented with white plaque on tongue. Has a h/o thrush treated with nystatin rinse at home - continue oral rinse - HIV screen pending.   HTN- BP range 90-143/43-87 on . Last reading 112/59 -holdhome BP meds to avoid hypotension  FEN/GI:  NPO. mIVF NS @ 150 mL/hr Prophylaxis: lovenox  Disposition: surgery today, pending correction of glucose  Subjective:  Patient says pain is well contolled with current pain meds. No cp, Sob.  Objective: Temp:  [97.4 F (36.3 C)-98.4 F (36.9 C)] 97.4 F (36.3 C) (11/21 0422) Pulse Rate:  [71-94] 89 (11/21 0424) Resp:  [10-25] 16 (11/21 0424) BP: (93-143)/(43-87) 112/59 (11/21 0424) SpO2:  [92 %-99 %] 95 % (11/21 0424) Physical Exam: General: NAD, resting in bed, morbidly obese AA female Cardiovascular: rrr, no mrg Respiratory: diminished breath sounds likely due to obesity, no crackles, normal work of breathing, 2L [place] Abdomen: soft, nontender, nondistended Extremities: left foot wrapped, no lower extremity edema  Laboratory: Recent Labs  Lab 12/17/16 0357 12/17/16 1308 12/18/16 0430  WBC 13.7* 13.3* 10.7*  HGB 16.4* 15.4* 15.0  HCT 44.8 42.6 41.6  PLT 290 303 278   Recent Labs  Lab 12/17/16 0357 12/17/16 1308 12/18/16 0430   NA 132* 135 133*  K 3.4* 3.5 3.8  CL 97* 100* 102  CO2 23 21* 21*  BUN 23* 23* 17  CREATININE 2.24* 1.95* 1.53*  CALCIUM 8.7* 8.7* 8.4*  GLUCOSE 361* 158* 255*    Bonnita Hollow, MD 12/18/2016, 7:29 AM PGY-1, [place] Intern pager: [phone], text pages welcome

## 2016-12-18 NOTE — Progress Notes (Signed)
PT refused CPAP. RT will monitor throughout the night as needed

## 2016-12-18 NOTE — Transfer of Care (Signed)
Immediate Anesthesia Transfer of Care Note  Patient: [name]  Procedure(s) Performed: IRRIGATION AND DEBRIDEMENT LEFT FOOT, CLOSURE (Left )  Patient Location: PACU  Anesthesia Type:General  Level of Consciousness: awake, alert , oriented, patient cooperative and responds to stimulation  Airway & Oxygen Therapy: Patient Spontanous Breathing and Patient connected to face mask oxygen  Post-op Assessment: Report given to RN, Post -op Vital signs reviewed and stable and Patient moving all extremities X 4  Post vital signs: Reviewed and stable  Last Vitals:  Vitals:   12/18/16 0424 12/18/16 0928  BP: (!) 112/59 (!) 142/89  Pulse: 89 75  Resp: 16 16  Temp:  36.6 C  SpO2: 95% 94%    Last Pain:  Vitals:   12/18/16 0928  TempSrc: Oral  PainSc:       Patients Stated Pain Goal: 5 (03/52/48 1859)  Complications: No apparent anesthesia complications

## 2016-12-18 NOTE — Anesthesia Preprocedure Evaluation (Addendum)
Anesthesia Evaluation  Patient identified by MRN, date of birth, ID band Patient awake    Reviewed: Allergy & Precautions, NPO status , Patient's Chart, lab work & pertinent test results  Airway Mallampati: II  TM Distance: >3 FB Neck ROM: Full    Dental  (+) Dental Advisory Given   Pulmonary asthma , sleep apnea , COPD, Current Smoker,    breath sounds clear to auscultation       Cardiovascular hypertension, Pt. on medications +CHF   Rhythm:Regular Rate:Normal  EF 55-60%. Valves okay   Neuro/Psych  Headaches, Anxiety Depression Bipolar Disorder  Neuromuscular disease    GI/Hepatic Neg liver ROS, GERD  ,  Endo/Other  diabetes, Type 2, Insulin DependentMorbid obesity  Renal/GU CRFRenal disease     Musculoskeletal   Abdominal   Peds  Hematology negative hematology ROS (+)   Anesthesia Other Findings   Reproductive/Obstetrics                             Lab Results  Component Value Date   WBC 10.7 (H) 12/18/2016   HGB 15.0 12/18/2016   HCT 41.6 12/18/2016   MCV 80.0 12/18/2016   PLT 278 12/18/2016   Lab Results  Component Value Date   CREATININE 1.53 (H) 12/18/2016   BUN 17 12/18/2016   NA 133 (L) 12/18/2016   K 3.8 12/18/2016   CL 102 12/18/2016   CO2 21 (L) 12/18/2016    Anesthesia Physical Anesthesia Plan  ASA: IV  Anesthesia Plan: General   Post-op Pain Management:    Induction: Intravenous  PONV Risk Score and Plan: 2 and Ondansetron, Midazolam and Treatment may vary due to age or medical condition  Airway Management Planned: Oral ETT  Additional Equipment:   Intra-op Plan:   Post-operative Plan: Extubation in OR  Informed Consent: I have reviewed the patients History and Physical, chart, labs and discussed the procedure including the risks, benefits and alternatives for the proposed anesthesia with the patient or authorized representative who has indicated  his/her understanding and acceptance.   Dental advisory given  Plan Discussed with:   Anesthesia Plan Comments:        Anesthesia Quick Evaluation

## 2016-12-18 NOTE — Progress Notes (Signed)
Orthopedic Tech Progress Note Patient Details:  [name] [birth year] [id number]  Ortho Devices Type of Ortho Device: CAM walker Ortho Device/Splint Location: LLE Ortho Device/Splint Interventions: Ordered, Application   Braulio Bosch 12/18/2016, 3:57 PM

## 2016-12-18 NOTE — Progress Notes (Signed)
Pt.'s CBG 360 and receiving IVF D51/2NS; FM paged and Dr. Vanetta Shawl RTC and orders given.

## 2016-12-18 NOTE — Progress Notes (Signed)
Inpatient Diabetes Program Recommendations  AACE/ADA: New Consensus Statement on Inpatient Glycemic Control (2015)  Target Ranges:  Prepandial:   less than 140 mg/dL      Peak postprandial:   less than 180 mg/dL (1-2 hours)      Critically ill patients:  140 - 180 mg/dL   Results for JUSTYNA, TIMONEY (MRN [id number]) as of 12/18/2016 10:12  Ref. Range 12/17/2016 16:55 12/17/2016 21:26 12/18/2016 00:13 12/18/2016 04:20 12/18/2016 08:13  Glucose-Capillary Latest Ref Range: 65 - 99 mg/dL 243 (H) 360 (H) 370 (H) 258 (H) 266 (H)   Review of Glycemic Control  Diabetes history: DM 2 Outpatient Diabetes medications: Lantus 16 units Current orders for Inpatient glycemic control: Lantus 10 units, Novolog Sensitive Correction 0-9 units tid  Inpatient Diabetes Program Recommendations:    Glucose trends above inpatient goal on reduced dose of home basal insulin. Consider increasing Lantus to 15 units. Note patient is NPO for surgery today.  Thanks,  Tama Headings RN, MSN, [facility] Inpatient Diabetes Coordinator Team Pager [phone] (8a-5p)

## 2016-12-18 NOTE — Op Note (Signed)
   Date of Surgery: 12/18/2016  INDICATIONS: [name] is a 51 y.o.-year-old female with a left foot traumatic lacerations of the third fourth and fifth toes;  The patient did consent to the procedure after discussion of the risks and benefits.  PREOPERATIVE DIAGNOSIS:  1.  Traumatic laceration of left third toe with exposed subcutaneous tissue and gross contamination 2.  Traumatic laceration of the left fourth toe with exposed subcutaneous tissue and tendon and gross contamination 3.  Traumatic laceration of left fifth toe with exposed subcutaneous tissue, tendon, bone  POSTOPERATIVE DIAGNOSIS: Same.  PROCEDURE:  1.  Excisional debridement and irrigation of left third toe traumatic laceration including skin and subcutaneous tissue and foreign material 2.  Excisional debridement irrigation of left fourth toe traumatic laceration including skin, subcutaneous tissue, tendon and foreign material 3.  Excisional debridement and irrigation of left fifth toe open fracture including bone, subcutaneous tissue, skin, tendon 4.  Closed treatment without manipulation of left second, third, fourth metatarsal fractures 5.  Closed treatment with manipulation of left fifth toe fracture proximal phalanx 6.  Adjacent tissue rearrangement left foot 13 cm  SURGEON: N. Eduard Roux, M.D.  ASSIST: None.  ANESTHESIA:  general  IV FLUIDS AND URINE: See anesthesia.  ESTIMATED BLOOD LOSS: Minimal mL.  IMPLANTS: None  DRAINS: None  COMPLICATIONS: None.  DESCRIPTION OF PROCEDURE: The patient was brought to the operating room and placed supine on the operating table.  The patient had been signed prior to the procedure and this was documented. The patient had the anesthesia placed by the anesthesiologist.  A time-out was performed to confirm that this was the correct patient, site, side and location. The patient did receive antibiotics prior to the incision and was re-dosed during the procedure as needed at  indicated intervals.  The patient had the operative extremity prepped and draped in the standard surgical fashion.    We first began with the third toe traumatic laceration.  Excisional debridement of foreign material and skin and subcutaneous tissue was performed using a knife and rongeur back to healthy bleeding viable tissue.  This was also performed for the fourth and fifth toe traumatic lacerations with a rongeur and knife.  In terms of the fifth toe excisional debridement of the proximal phalanx was also performed for the open fracture.  The metatarsal fractures were treated in a closed manner in the fifth toe fracture was treated with closed manipulation.  After thorough debridement of the lacerations they were then irrigated with 6 L of normal saline using cystoscopy tubing.  Hemostasis was obtained.  Adjacent tissue rearrangement of the left foot was performed in order to primarily close the traumatic lacerations.  Sterile dressings were applied.  The toes were warm and well-perfused with normal capillary refill.  Patient tolerated procedure well had no immediate complications.  POSTOPERATIVE PLAN: Patient may weight-bear as tolerated in a Cam walker.  Follow-up in 1 week for wound check.  Patient will need to remain on 2 weeks of Bactrim  N. Eduard Roux, MD [place] 1:38 PM

## 2016-12-18 NOTE — Anesthesia Procedure Notes (Signed)
Procedure Name: Intubation Date/Time: 12/18/2016 12:45 PM Performed by: Glynda Jaeger, CRNA Pre-anesthesia Checklist: Patient identified, Emergency Drugs available, Suction available, Patient being monitored and Timeout performed Patient Re-evaluated:Patient Re-evaluated prior to induction Preoxygenation: Pre-oxygenation with 100% oxygen Induction Type: IV induction Ventilation: Mask ventilation without difficulty Laryngoscope Size: Mac and 4 Grade View: Grade I Tube type: Oral Tube size: 7.0 mm Number of attempts: 1 Airway Equipment and Method: Stylet Placement Confirmation: ETT inserted through vocal cords under direct vision,  positive ETCO2 and breath sounds checked- equal and bilateral Secured at: 21 cm Tube secured with: Tape Dental Injury: Teeth and Oropharynx as per pre-operative assessment

## 2016-12-19 ENCOUNTER — Encounter (HOSPITAL_COMMUNITY): Payer: Self-pay | Admitting: Orthopaedic Surgery

## 2016-12-19 LAB — CBC
HCT: 38.7 % (ref 36.0–46.0)
Hemoglobin: 13.9 g/dL (ref 12.0–15.0)
MCH: 28.8 pg (ref 26.0–34.0)
MCHC: 35.9 g/dL (ref 30.0–36.0)
MCV: 80.1 fL (ref 78.0–100.0)
PLATELETS: 254 10*3/uL (ref 150–400)
RBC: 4.83 MIL/uL (ref 3.87–5.11)
RDW: 13.4 % (ref 11.5–15.5)
WBC: 10.8 10*3/uL — ABNORMAL HIGH (ref 4.0–10.5)

## 2016-12-19 LAB — BASIC METABOLIC PANEL
Anion gap: 7 (ref 5–15)
BUN: 13 mg/dL (ref 6–20)
CALCIUM: 8.1 mg/dL — AB (ref 8.9–10.3)
CO2: 23 mmol/L (ref 22–32)
CREATININE: 1.4 mg/dL — AB (ref 0.44–1.00)
Chloride: 105 mmol/L (ref 101–111)
GFR calc Af Amer: 49 mL/min — ABNORMAL LOW (ref >60)
GFR, EST NON AFRICAN AMERICAN: 42 mL/min — AB (ref >60)
GLUCOSE: 296 mg/dL — AB (ref 65–99)
Potassium: 4.2 mmol/L (ref 3.5–5.1)
SODIUM: 135 mmol/L (ref 135–145)

## 2016-12-19 LAB — GLUCOSE, CAPILLARY
GLUCOSE-CAPILLARY: 284 mg/dL — AB (ref 65–99)
GLUCOSE-CAPILLARY: 274 mg/dL — AB (ref 65–99)
Glucose-Capillary: 233 mg/dL — ABNORMAL HIGH (ref 65–99)
Glucose-Capillary: 313 mg/dL — ABNORMAL HIGH (ref 65–99)
Glucose-Capillary: 242 mg/dL — ABNORMAL HIGH (ref 65–99)
Glucose-Capillary: 277 mg/dL — ABNORMAL HIGH (ref 65–99)

## 2016-12-19 MED ORDER — INSULIN GLARGINE 100 UNIT/ML ~~LOC~~ SOLN
12.0000 [IU] | Freq: Every day | SUBCUTANEOUS | Status: DC
  Administered 2016-12-20: 12 [IU] via SUBCUTANEOUS
  Filled 2016-12-19: qty 0.12

## 2016-12-19 MED ORDER — INSULIN GLARGINE 100 UNIT/ML ~~LOC~~ SOLN
2.0000 [IU] | Freq: Once | SUBCUTANEOUS | Status: AC
  Administered 2016-12-19: 2 [IU] via SUBCUTANEOUS
  Filled 2016-12-19: qty 0.02

## 2016-12-19 NOTE — Progress Notes (Signed)
Pt wore CPAP from 10pm throughout the night until about 5am.

## 2016-12-19 NOTE — Progress Notes (Signed)
Family Medicine Teaching Service Daily Progress Note Intern Pager: [phone]  Patient name: [name] Medical record number: [id number] Date of birth: [birth year] Age: 51 y.o. Gender: female  Primary Care Provider: [name], MD Consultants: Orthopedics Code Status: Full  Pt Overview and Major Events to Date:  [name] is a 51 y.o. female presenting with left foot laceration s/p fall. PMH is significant for T2DM, COPD, morbid obesity, HLD, HTN, CKD3, HFpEF  11/21 - orthopedic debridement  Assessment and Plan:  Hyperglycemia concerning for VFI:EPPIRJJO. Transitioned onto subq insulin 10 units + SSI. CBGs elevated in 200s. Will need to titrate to home dose of lantus 16 units. Will need diet replaced after surgery. - lantus 10 units, increase to 12 units  - SSI  - ensure patient has glucometer on discharge -gabapentin   Laceration of left foot with multiple fractures Orthopedics completed surgical debridement 11/21.  - appreciate orthopedics recommendations; weight bear as tolerated in Cam walker, follow up with ortho in 1 week for wound check  - Tylenol, Norco, and morphine for pain (received 3 mg total in last 24h)  -Bactrim per ortho for 14 day course  -PT consulted, pending recs   Elevated white blood cell count/elevated hemoglobin: Improved 16.2 > 13.7 > 10.7> 10.8 Baseline 11-13.  Chest x-ray is negative for infection.  No other signs or symptoms of infection noted -Monitor with labs  COPD- satting well on RA. no current SOB complaints. Likely obesity hypoventilation syndrome given BMI/body habitus and debilitation. Does not use oxygen at home. Smokes 1 pack of cigarettes a day -home ventolin -home singulair -home spiriva -Continue Dulera   OSA: Patient reports history of using CPAP. - monitor respiratory status - CPAP qhs - consider outpatient referral for sleep study  HFpEF 55-60%- from echo 6 months ago. No s/s of fluid overload.  -will monitor  for fluid overload given significant rehydration involved with HHS protocol  Oral Thrush. Patient presented with white plaque on tongue. Has a h/o thrush treated with nystatin rinse at home - continue oral rinse - HIV screen pending.   HTN- BP range 90-143/43-87 on . Last reading 112/59 -holdhome BP meds to avoid hypotension  FEN/GI:  Heart Healthy/Carb Modified Diet  Prophylaxis: lovenox  Disposition: Home   Subjective:  Patient reports pain is manageable. She has a good appetite without N/V. Has not yet gotten out of bed to ambulate with Cam Walker.    Objective: Temp:  [97.7 F (36.5 C)-98.4 F (36.9 C)] 97.7 F (36.5 C) (11/22 0807) Pulse Rate:  [74-95] 74 (11/22 0807) Resp:  [12-22] 21 (11/22 0807) BP: (101-157)/(56-117) 102/56 (11/22 0807) SpO2:  [92 %-100 %] 92 % (11/22 0807) Weight:  [370 lb (167.8 kg)] 370 lb (167.8 kg) (11/21 1158) Physical Exam: General: NAD, resting in bed, morbidly obese AA female Cardiovascular: rrr, no mrg Respiratory: diminished breath sounds likely due to obesity, no crackles, normal work of breathing Abdomen: soft, nontender, nondistended Extremities: left foot in Cam Walker, sensation to left toes intact and able to wiggle toes  Laboratory: Recent Labs  Lab 12/17/16 1308 12/18/16 0430 12/19/16 0425  WBC 13.3* 10.7* 10.8*  HGB 15.4* 15.0 13.9  HCT 42.6 41.6 38.7  PLT 303 278 254   Recent Labs  Lab 12/17/16 1308 12/18/16 0430 12/19/16 0425  NA 135 133* 135  K 3.5 3.8 4.2  CL 100* 102 105  CO2 21* 21* 23  BUN 23* 17 13  CREATININE 1.95* 1.53* 1.40*  CALCIUM 8.7*  8.4* 8.1*  GLUCOSE 158* 255* 296*    Mary Bang, DO 12/19/2016, 9:37 AM PGY-3, [place] Intern pager: [phone], text pages welcome

## 2016-12-19 NOTE — Progress Notes (Signed)
Patient is doing well this morning from orthopedic standpoint.  Pain is well controlled.  Dressings are clean dry and intact.  Toes are warm and well perfused.  Patient may weight-bear as tolerated in a Cam walker.  May leave the Cam walker off when in bed.  Follow-up in 1 week in the office for wound check.  Surgical dressings do not need to be changed until then.

## 2016-12-19 NOTE — Evaluation (Signed)
Physical Therapy Evaluation Patient Details Name: [name] MRN: [id number] DOB: 03-11-1965 Today's Date: 12/19/2016   History of Present Illness  Pt adm after fall with lt foot fx's and laceration. Underwent debridement of foot on 12/18/16. PMH - DM, COPD, morbid obesity, HLD, HTN, CKD3, HF, back surgeries, neck surgeries  Clinical Impression  Pt admitted with above diagnosis and presents to PT with functional limitations due to deficits listed below (See PT problem list). Pt needs skilled PT to maximize independence and safety to allow discharge to home with intermittent assist. Pt moving fairly well with walker and expect she will continue to progress well and return home.      Follow Up Recommendations Home health PT;Supervision - Intermittent    Equipment Recommendations  None recommended by PT    Recommendations for Other Services       Precautions / Restrictions Precautions Precautions: Fall Required Braces or Orthoses: Other Brace/Splint Other Brace/Splint: CAM boot on Lt when up Restrictions Weight Bearing Restrictions: Yes LLE Weight Bearing: Weight bearing as tolerated      Mobility  Bed Mobility               General bed mobility comments: Pt up in chair  Transfers Overall transfer level: Needs assistance Equipment used: Rolling walker (2 wheeled) Transfers: Sit to/from Stand Sit to Stand: Min guard         General transfer comment: Assist for safety  Ambulation/Gait Ambulation/Gait assistance: Min guard Ambulation Distance (Feet): 100 Feet Assistive device: Rolling walker (2 wheeled) Gait Pattern/deviations: Step-through pattern;Decreased step length - right;Decreased stance time - left;Antalgic Gait velocity: decr Gait velocity interpretation: Below normal speed for age/gender General Gait Details: Verbal cues for positioning relative to walker initially. Assist for safety  Stairs            Wheelchair Mobility    Modified  Rankin (Stroke Patients Only)       Balance Overall balance assessment: Needs assistance Sitting-balance support: No upper extremity supported;Feet supported Sitting balance-Leahy Scale: Normal     Standing balance support: Bilateral upper extremity supported Standing balance-Leahy Scale: Poor Standing balance comment: walker and supervision for static standing                             Pertinent Vitals/Pain Pain Assessment: 0-10 Pain Score: 8  Pain Location: lt foot Pain Descriptors / Indicators: Aching;Throbbing Pain Intervention(s): Limited activity within patient's tolerance;Monitored during session    [place] expects to be discharged to:: Private residence Living Arrangements: Alone Available Help at Discharge: Family;Personal care attendant;Available PRN/intermittently Type of Home: House Home Access: Stairs to enter Entrance Stairs-Rails: Left(Pillar on rt) Entrance Stairs-Number of Steps: 4-5 Home Layout: One level Home Equipment: Walker - 2 wheels;Cane - single point;Tub bench Additional Comments: Pt has aide every evening for ~ 1 hour    Prior Function Level of Independence: Independent               Hand Dominance        Extremity/Trunk Assessment   Upper Extremity Assessment Upper Extremity Assessment: Overall WFL for tasks assessed    Lower Extremity Assessment Lower Extremity Assessment: LLE deficits/detail;RLE deficits/detail RLE Deficits / Details: pt reports leg giving out causing falls in the past LLE Deficits / Details: Limited by CAM boot and pain       Communication   Communication: No difficulties  Cognition Arousal/Alertness: Awake/alert Behavior During Therapy: WFL for tasks  assessed/performed Overall Cognitive Status: Within Functional Limits for tasks assessed                                        General Comments      Exercises     Assessment/Plan    PT Assessment  Patient needs continued PT services  PT Problem List Decreased strength;Decreased balance;Decreased mobility;Obesity;Pain       PT Treatment Interventions DME instruction;Gait training;Stair training;Functional mobility training;Therapeutic activities;Therapeutic exercise;Balance training;Patient/family education    PT Goals (Current goals can be found in the Care Plan section)  Acute Rehab PT Goals Patient Stated Goal: return home PT Goal Formulation: With patient Time For Goal Achievement: 12/26/16 Potential to Achieve Goals: Good    Frequency Min 3X/week   Barriers to discharge Decreased caregiver support Lives alone    Co-evaluation               AM-PAC PT "6 Clicks" Daily Activity  Outcome Measure Difficulty turning over in bed (including adjusting bedclothes, sheets and blankets)?: None Difficulty moving from lying on back to sitting on the side of the bed? : None Difficulty sitting down on and standing up from a chair with arms (e.g., wheelchair, bedside commode, etc,.)?: Unable Help needed moving to and from a bed to chair (including a wheelchair)?: A Little Help needed walking in hospital room?: A Little Help needed climbing 3-5 steps with a railing? : A Little 6 Click Score: 18    End of Session Equipment Utilized During Treatment: Other (comment)(attempted gait belt but pt too large) Activity Tolerance: Patient tolerated treatment well Patient left: in chair;with call bell/phone within reach Nurse Communication: Mobility status PT Visit Diagnosis: Other abnormalities of gait and mobility (R26.89);Repeated falls (R29.6)    Time: 1720-1745 PT Time Calculation (min) (ACUTE ONLY): 25 min   Charges:   PT Evaluation $PT Eval Moderate Complexity: 1 Mod PT Treatments $Gait Training: 8-22 mins   PT G Codes:        [facility] PT [place] 12/19/2016, 6:05 PM

## 2016-12-20 DIAGNOSIS — R739 Hyperglycemia, unspecified: Secondary | ICD-10-CM

## 2016-12-20 DIAGNOSIS — S92502B Displaced unspecified fracture of left lesser toe(s), initial encounter for open fracture: Secondary | ICD-10-CM

## 2016-12-20 DIAGNOSIS — I7389 Other specified peripheral vascular diseases: Secondary | ICD-10-CM

## 2016-12-20 DIAGNOSIS — S92902A Unspecified fracture of left foot, initial encounter for closed fracture: Secondary | ICD-10-CM

## 2016-12-20 LAB — CBC
HEMATOCRIT: 37.2 % (ref 36.0–46.0)
Hemoglobin: 13.1 g/dL (ref 12.0–15.0)
MCH: 28.7 pg (ref 26.0–34.0)
MCHC: 35.2 g/dL (ref 30.0–36.0)
MCV: 81.4 fL (ref 78.0–100.0)
Platelets: 252 10*3/uL (ref 150–400)
RBC: 4.57 MIL/uL (ref 3.87–5.11)
RDW: 13.7 % (ref 11.5–15.5)
WBC: 9.2 10*3/uL (ref 4.0–10.5)

## 2016-12-20 LAB — GLUCOSE, CAPILLARY
GLUCOSE-CAPILLARY: 219 mg/dL — AB (ref 65–99)
Glucose-Capillary: 277 mg/dL — ABNORMAL HIGH (ref 65–99)
Glucose-Capillary: 229 mg/dL — ABNORMAL HIGH (ref 65–99)
Glucose-Capillary: 176 mg/dL — ABNORMAL HIGH (ref 65–99)

## 2016-12-20 LAB — BASIC METABOLIC PANEL
ANION GAP: 7 (ref 5–15)
BUN: 10 mg/dL (ref 6–20)
CALCIUM: 8.4 mg/dL — AB (ref 8.9–10.3)
CO2: 23 mmol/L (ref 22–32)
Chloride: 105 mmol/L (ref 101–111)
Creatinine, Ser: 1.41 mg/dL — ABNORMAL HIGH (ref 0.44–1.00)
GFR calc Af Amer: 48 mL/min — ABNORMAL LOW (ref >60)
GFR calc non Af Amer: 42 mL/min — ABNORMAL LOW (ref >60)
GLUCOSE: 256 mg/dL — AB (ref 65–99)
Potassium: 3.8 mmol/L (ref 3.5–5.1)
Sodium: 135 mmol/L (ref 135–145)

## 2016-12-20 MED ORDER — BLOOD GLUCOSE MONITOR KIT: PACK | 0 refills | Status: DC

## 2016-12-20 MED ORDER — HYDROCODONE-ACETAMINOPHEN 7.5-325 MG PO TABS: 1.0000 | ORAL_TABLET | ORAL | 0 refills | Status: DC | PRN

## 2016-12-20 MED ORDER — ACETAMINOPHEN 325 MG PO TABS: 650.0000 mg | ORAL_TABLET | Freq: Four times a day (QID) | ORAL | 0 refills | Status: DC | PRN

## 2016-12-20 MED ORDER — SULFAMETHOXAZOLE-TRIMETHOPRIM 800-160 MG PO TABS: 1.0000 | ORAL_TABLET | Freq: Two times a day (BID) | ORAL | 0 refills | Status: DC

## 2016-12-20 NOTE — Progress Notes (Signed)
Physical Therapy Treatment Patient Details Name: [name] MRN: [id number] DOB: August 24, 1965 Today's Date: 12/20/2016    History of Present Illness Pt adm after fall with lt foot fx's and laceration. Underwent debridement of foot on 12/18/16. PMH - DM, COPD, morbid obesity, HLD, HTN, CKD3, HF, back surgeries, neck surgeries    PT Comments    Pt progressed towards PT goals today. Pt was able to ambulate further distances than last session and climb 4 stairs with min guard assist only for safety. Pt's SpO2 ranged from 87 to 93% on RA throughout session. Pt was educated on pursed-lip breathing and use of incentive spirometer to improve breathing. Pt motivated to continue improving and strengthening body. Pt remains good candidate for skilled PT acutely in order to increase level of independence and improve mobility status.    Follow Up Recommendations  Home health PT;Supervision - Intermittent     Equipment Recommendations  None recommended by PT    Recommendations for Other Services       Precautions / Restrictions Precautions Precautions: Fall Other Brace/Splint: CAM boot on Lt when up Restrictions LLE Weight Bearing: Weight bearing as tolerated    Mobility  Bed Mobility               General bed mobility comments: in chair on arrival and end of session  Transfers Overall transfer level: Needs assistance   Transfers: Sit to/from Stand Sit to Stand: Supervision         General transfer comment: initial cues for hand placement  Ambulation/Gait Ambulation/Gait assistance: Supervision Ambulation Distance (Feet): 200 Feet Assistive device: Rolling walker (2 wheeled) Gait Pattern/deviations: Step-through pattern;Decreased stride length   Gait velocity interpretation: Below normal speed for age/gender General Gait Details: cues for position and posture as well as breathing technique for pursed lip breathing. SpO2 87-93% on RA with gait   Stairs Stairs:  Yes   Stair Management: One rail Left;Step to pattern;Sideways Number of Stairs: 4 General stair comments: cues for sequence with pt able to perform with use of rail   Wheelchair Mobility    Modified Rankin (Stroke Patients Only)       Balance Overall balance assessment: Needs assistance   Sitting balance-Leahy Scale: Good       Standing balance-Leahy Scale: Fair                              Cognition Arousal/Alertness: Awake/alert Behavior During Therapy: WFL for tasks assessed/performed Overall Cognitive Status: Within Functional Limits for tasks assessed                                        Exercises      General Comments        Pertinent Vitals/Pain Pain Assessment: No/denies pain    Home Living                      Prior Function            PT Goals (current goals can now be found in the care plan section) Progress towards PT goals: Progressing toward goals    Frequency           PT Plan Current plan remains appropriate    Co-evaluation              AM-PAC PT "6  Clicks" Daily Activity  Outcome Measure  Difficulty turning over in bed (including adjusting bedclothes, sheets and blankets)?: None Difficulty moving from lying on back to sitting on the side of the bed? : A Little Difficulty sitting down on and standing up from a chair with arms (e.g., wheelchair, bedside commode, etc,.)?: A Little Help needed moving to and from a bed to chair (including a wheelchair)?: A Little Help needed walking in hospital room?: A Little Help needed climbing 3-5 steps with a railing? : None 6 Click Score: 20    End of Session Equipment Utilized During Treatment: Gait belt;Other (comment)(CAM boot LLe) Activity Tolerance: Patient tolerated treatment well Patient left: in chair;with call bell/phone within reach Nurse Communication: Mobility status PT Visit Diagnosis: Other abnormalities of gait and mobility  (R26.89);Repeated falls (R29.6)     Time: 6815-9470 PT Time Calculation (min) (ACUTE ONLY): 26 min  Charges:  $Gait Training: 8-22 mins $Therapeutic Activity: 8-22 mins                    G Codes:       Judee Clara, SPT   Judee Clara 12/20/2016, 1:32 PM

## 2016-12-20 NOTE — Progress Notes (Signed)
Patient states son will be here to take her home by 6 pm. Patient has ordered dinner tray. Patient sitting in recliner. RN will continue to monitor.

## 2016-12-20 NOTE — Progress Notes (Signed)
Family Medicine Teaching Service Daily Progress Note Intern Pager: [phone]  Patient name: [name] Medical record number: [id number] Date of birth: 02-Jun-1965 Age: 51 y.o. Gender: female  Primary Care Provider: [name], MD Consultants: Orthopedics Code Status: Full  Pt Overview and Major Events to Date:  [name] is a 51 y.o. female presenting with left foot laceration s/p fall. PMH is significant for T2DM, COPD, morbid obesity, HLD, HTN, CKD3, HFpEF  11/21 - orthopedic debridement  Assessment and Plan:  Hyperglycemia concerning for DSK:AJGO within 200s. Currently getting 12 units lantus. At bedside, she said that she did not know what low blood sugars were, signs/symptoms of hypoglycemia, or how to correct them. Appreciate dibetes educator. Will call pharmacy to ensure that she has a rx for glucometer. Will use HH nursing for wound care management and diabetes monitoring.  - 12 units lantus, will discharge on 16 - SSI  - ensure patient has glucometer on discharge -gabapentin   Laceration of left foot with multiple fractures Orthopedics completed surgical debridement 11/21. Pain well controlled on PO pain medications. Pt recommends HHPT.  - appreciate orthopedics recommendations; weight bear as tolerated in Cam walker, follow up with ortho in 1 week for wound check  - Tylenol, Norco, and morphine for pain (received 3 mg total in last 24h)  -Bactrim per ortho for 14 day course   Oral Thrush. Patient presented with white plaque on tongue. Has a h/o thrush treated with nystatin rinse at home. HIV negative.  - continue oral rinse - HIV screen pending.   HTN- at goal. Restart bp meds on discharge.   FEN/GI:  Heart Healthy/Carb Modified Diet  Prophylaxis: lovenox  Disposition: Home   Subjective:  Patient reports pain is controlled on PO meds. She is passing flatus and walking around with Cam walker. [name] Kitchen UOP is normal   Objective: Temp:  [97.6 F (36.4  C)-98.6 F (37 C)] 97.6 F (36.4 C) (11/23 0753) Pulse Rate:  [72-83] 73 (11/23 0800) Resp:  [14-21] 18 (11/23 0753) BP: (105-123)/(54-78) 123/71 (11/23 0800) SpO2:  [90 %-99 %] 99 % (11/23 1036) Physical Exam: General: NAD, resting in bed, morbidly obese AA female Cardiovascular: rrr, no mrg Respiratory: diminished breath sounds likely due to obesity, no crackles, normal work of breathing Abdomen: soft, nontender, nondistended Extremities: left foot in Cam Walker, able to move lower extremities.   Laboratory: Recent Labs  Lab 12/18/16 0430 12/19/16 0425 12/20/16 0547  WBC 10.7* 10.8* 9.2  HGB 15.0 13.9 13.1  HCT 41.6 38.7 37.2  PLT 278 254 252   Recent Labs  Lab 12/18/16 0430 12/19/16 0425 12/20/16 0547  NA 133* 135 135  K 3.8 4.2 3.8  CL 102 105 105  CO2 21* 23 23  BUN 17 13 10   CREATININE 1.53* 1.40* 1.41*  CALCIUM 8.4* 8.1* 8.4*  GLUCOSE 255* 296* 256*    Mary Hollow, MD 12/20/2016, 11:56 AM PGY-1, [place] Intern pager: [phone], text pages welcome

## 2016-12-20 NOTE — Plan of Care (Signed)
  Progressing Education: Knowledge of General Education information will improve 12/20/2016 0217 - Progressing by Ria Bush, RN Health Behavior/Discharge Planning: Ability to manage health-related needs will improve 12/20/2016 0217 - Progressing by Ria Bush, RN Clinical Measurements: Ability to maintain clinical measurements within normal limits will improve 12/20/2016 0217 - Progressing by Ria Bush, RN Will remain free from infection 12/20/2016 0217 - Progressing by Ria Bush, RN Diagnostic test results will improve 12/20/2016 0217 - Progressing by Ria Bush, RN Respiratory complications will improve 12/20/2016 0217 - Progressing by Ria Bush, RN Cardiovascular complication will be avoided 12/20/2016 0217 - Progressing by Ria Bush, RN Activity: Risk for activity intolerance will decrease 12/20/2016 0217 - Progressing by Ria Bush, RN Nutrition: Adequate nutrition will be maintained 12/20/2016 0217 - Progressing by Ria Bush, RN Coping: Level of anxiety will decrease 12/20/2016 0217 - Progressing by Ria Bush, RN Elimination: Will not experience complications related to bowel motility 12/20/2016 0217 - Progressing by Ria Bush, RN Will not experience complications related to urinary retention 12/20/2016 0217 - Progressing by Ria Bush, RN Pain Managment: General experience of comfort will improve 12/20/2016 0217 - Progressing by Ria Bush, RN Safety: Ability to remain free from injury will improve 12/20/2016 0217 - Progressing by Ria Bush, RN Skin Integrity: Risk for impaired skin integrity will decrease 12/20/2016 0217 - Progressing by Ria Bush, RN Education: Ability to describe self-care measures that may prevent or decrease complications (Diabetes Survival Skills Education) will improve 12/20/2016 0217 - Progressing by Ria Bush, RN Coping: Ability to  adjust to condition or change in health will improve 12/20/2016 0217 - Progressing by Ria Bush, RN Fluid Volume: Ability to maintain a balanced intake and output will improve 12/20/2016 0217 - Progressing by Ria Bush, RN Health Behavior/Discharge Planning: Ability to identify and utilize available resources and services will improve 12/20/2016 0217 - Progressing by Ria Bush, RN Ability to manage health-related needs will improve 12/20/2016 0217 - Progressing by Ria Bush, RN Metabolic: Ability to maintain appropriate glucose levels will improve 12/20/2016 0217 - Progressing by Ria Bush, RN Nutritional: Maintenance of adequate nutrition will improve 12/20/2016 0217 - Progressing by Ria Bush, RN Progress toward achieving an optimal weight will improve 12/20/2016 0217 - Progressing by Ria Bush, RN Skin Integrity: Risk for impaired skin integrity will decrease 12/20/2016 0217 - Progressing by Ria Bush, RN Tissue Perfusion: Adequacy of tissue perfusion will improve 12/20/2016 0217 - Progressing by Ria Bush, RN

## 2016-12-20 NOTE — Discharge Instructions (Signed)
You were admitted to the hospital for hyperglycemia (high blood sugar) and for fractures and laceration on your foot. Please take you insulin as prescribed and check your blood sugar 3 times a day before meals. Please go to your scheduled appointments for diabetes management, hospital follow up, and orthopedic follow up.   Postoperative instructions:  Weightbearing instructions: as tolerated in CAM walker only  Dressing instructions: Keep your dressing and/or splint clean and dry at all times.  It will be removed at your first post-operative appointment.  Your stitches and/or staples will be removed at this visit.  Incision instructions:  Do not soak your incision for 3 weeks after surgery.  If the incision gets wet, pat dry and do not scrub the incision.  Pain control:  You have been given a prescription to be taken as directed for post-operative pain control.  In addition, elevate the operative extremity above the heart at all times to prevent swelling and throbbing pain.  Take over-the-counter Colace, 100mg  by mouth twice a day while taking narcotic pain medications to help prevent constipation.  Follow up appointments: 1) 10-14 days for suture removal and wound check. 2) Dr. Erlinda Hong as scheduled.   -------------------------------------------------------------------------------------------------------------  After Surgery Pain Control:  After your surgery, post-surgical discomfort or pain is likely. This discomfort can last several days to a few weeks. At certain times of the day your discomfort may be more intense.  Did you receive a nerve block?  A nerve block can provide pain relief for one hour to two days after your surgery. As long as the nerve block is working, you will experience little or no sensation in the area the surgeon operated on.  As the nerve block wears off, you will begin to experience pain or discomfort. It is very important that you begin taking your prescribed pain  medication before the nerve block fully wears off. Treating your pain at the first sign of the block wearing off will ensure your pain is better controlled and more tolerable when full-sensation returns. Do not wait until the pain is intolerable, as the medicine will be less effective. It is better to treat pain in advance than to try and catch up.  General Anesthesia:  If you did not receive a nerve block during your surgery, you will need to start taking your pain medication shortly after your surgery and should continue to do so as prescribed by your surgeon.  Pain Medication:  Most commonly we prescribe Vicodin and Percocet for post-operative pain. Both of these medications contain a combination of acetaminophen (Tylenol) and a narcotic to help control pain.   It takes between 30 and 45 minutes before pain medication starts to work. It is important to take your medication before your pain level gets too intense.   Nausea is a common side effect of many pain medications. You will want to eat something before taking your pain medicine to help prevent nausea.   If you are taking a prescription pain medication that contains acetaminophen, we recommend that you do not take additional over the counter acetaminophen (Tylenol).  Other pain relieving options:   Using a cold pack to ice the affected area a few times a day (15 to 20 minutes at a time) can help to relieve pain, reduce swelling and bruising.   Elevation of the affected area can also help to reduce pain and swelling.    Hyperglycemic Hyperosmolar State Hyperglycemic hyperosmolar state is a serious condition in which you experience an  extreme increase in your blood sugar (glucose) level. This makes your body become extremely dehydrated, which can be life-threatening. This condition is a result of uncontrolled or undiagnosed diabetes. It occurs most often in people who have type 2 diabetes (type 2 diabetes mellitus). Certain hormones  (insulin and glucagon) control the level of glucose that is in the blood. Insulin lowers blood glucose, and glucagon increases blood glucose. Hyperglycemia can result from having too little insulin in the bloodstream, or from the body not responding normally to insulin. Normally, the body gets rid of excess glucose through urine. If you do not drink enough fluids, or if you drink fluids that contain sugar, your body cannot get rid of excess glucose. This can result in hyperglycemic hyperosmolar state. What are the causes? This condition may be caused by:  Infection.  Medicines that cause you to become dehydrated or cause you to lose fluid.  Certain illnesses.  Not taking your diabetes medicine.  New onset or diagnosis of diabetes.  Cardiovascular disease (CVD).  What increases the risk? The following factors make you more likely to develop this condition:  Older age.  Poor management of diabetes.  Inability to eat or drink normally.  Heart failure.  Infection.  Surgery.  Illness.  What are the signs or symptoms? Symptoms of this condition include:  Extreme or increased thirst. This symptom may gradually disappear.  Needing to urinate more often than usual.  Dry mouth.  Warm, dry skin that does not sweat even in high temperatures.  High fever.  Sleepiness or confusion.  Vision problems or vision loss.  Seeing, hearing, tasting, smelling, or feeling things that are not real (hallucinations).  Weakness.  Weight loss.  Vomiting.  How is this diagnosed? Hyperglycemic hyperosmolar state is diagnosed based on your medical history, your symptoms, and a blood test to measure your blood glucose level. How is this treated? This condition is treated in the hospital. The goals of treatment are:  To correct dehydration by replacing fluids that you have lost. Fluids will be given through an IV tube.  To improve blood sugar levels using insulin or other medicines as  needed.  To treat the cause of hyperglycemia, such as an infection, illness, or newly diagnosed diabetes.  Follow these instructions at home: General instructions  Take over-the-counter and prescription medicines only as told by your health care provider.  Do not use any products that contain nicotine or tobacco, such as cigarettes and e-cigarettes. If you need help quitting, ask your health care provider.  Limit alcohol intake to no more than 1 drink a day for nonpregnant women and 2 drinks a day for men. One drink equals 12 oz of beer, 5 oz of wine, or 1 oz of hard liquor.  Stay hydrated, especially when you exercise, when you get sick, or when you spend time in hot temperatures.  Learn to manage stress. If you need help with this, ask your health care provider.  Keep all follow-up visits as told by your health care provider. This is important. Eating and drinking  Maintain a healthy weight.  Exercise regularly, as directed by your health care provider.  Eat healthy foods, such as: ? Lean proteins. ? Complex carbohydrates. ? Fresh fruits and vegetables. ? Low-fat dairy products. ? Healthy fats.  Drink enough fluid to keep your urine clear or pale yellow. If You Have Diabetes:   Make sure you know the early signs and symptoms of hyperglycemia.  Follow your diabetes management plan, as  told by your health care provider. Make sure you: ? Take your insulin and medicines as directed. ? Follow your exercise plan. ? Follow your meal plan. Eat on time, and do not skip meals. ? Check your blood glucose as often as directed. Make sure to check your blood glucose before and after exercise. If you exercise longer or in a different way than usual, check your blood glucose more often. ? Follow your sick day plan whenever you cannot eat or drink normally. Make this plan in advance with your health care provider.  Share your diabetes management plan with people in your workplace,  school, and household.  Check your urine for ketones when you are ill and as often as told by your health care provider.  Carry a medical alert card or wear medical alert jewelry. Contact a health care provider if:  You cannot eat or drink without throwing up.  You develop a fever. Get help right away if:  You develop symptoms of hyperglycemic hyperosmolar state. These symptoms may represent a serious problem that is an emergency. Do not wait to see if the symptoms will go away. Get medical help right away. Call your local emergency services (911 in the [place] Do not drive yourself to the hospital. Summary  Hyperglycemic hyperosmolar state is a serious condition in which you experience an extreme increase in your blood sugar (glucose) level. This makes your body become extremely dehydrated, which can be life-threatening.  This condition is a result of uncontrolled or undiagnosed diabetes. It occurs most often in people who have type 2 diabetes (type 2 diabetes mellitus).  This condition is treated in the hospital. Treatment may include fluids given through an IV tube and other medicines.  Make sure you know the early signs and symptoms of hyperglycemia.  Follow your diabetes management plan, as told by your health care provider. This information is not intended to replace advice given to you by your health care provider. Make sure you discuss any questions you have with your health care provider. Document Released: 11/17/2003 Document Revised: 01/08/2016 Document Reviewed: 01/08/2016 Elsevier Interactive Patient Education  [employer].

## 2016-12-20 NOTE — Progress Notes (Signed)
Referral received. Patient is newly diagnosed with DM.  We discussed survival skills education including hypoglycemia, hyperglycemia, when to call MD, monitoring, and brief diet information.  I stressed the importance of cutting out sugar in beverages and juices.  She has totally changed the way she was eating and states that she lost 6 pounds in the past week due to watching what she eats.  She has f/u appointment with PCP on Monday. She states that she thinks she has things figured out with her meter and plans to get it from [facility].  Patient appreciative of visit. I congratulated her on the lifestyle changes she has begun to make.   Of note, patient did receive steroid injection in her back on 10/1816.  Could this be the reason that blood sugars were so markedly increased with initial diagnosis?  Thanks, Adah Perl, RN, BC-ADM Inpatient Diabetes Coordinator Pager [phone]

## 2016-12-20 NOTE — Progress Notes (Signed)
2000- Patient's CBG was 242, she stated she was hungry and wanted saltine and graham crackers. Educated patient about carb modified dietary restrictions and encouraged sugar-free snacks. Patient verbalized understanding and stated she would like more diabetes teaching before discharge.  2300- Patient requested CPAP be put on for sleep.

## 2016-12-20 NOTE — Progress Notes (Signed)
Per Dr. Sharol Given if patient dressing requires changing prior to 1 week f/u in office, patient may wrap with Kerlex and ace bandage. Dressing change education provided to patient. Patient v/u.

## 2016-12-20 NOTE — Care Management Note (Signed)
Case Management Note  Patient Details  Name: [name] MRN: [id number] Date of Birth: December 05, 1965  Subjective/Objective:   Pt adm after fall with lt foot fx's and laceration. Underwent debridement of foot on 12/18/16.  PTA, pt independent, lives alone.                   Action/Plan: Pt medically stable for discharge home today.  She states she has all needed DME at home.  Home health RN/PT ordered, and pt agreeable to [facility] follow up.  Referral to [facility] for [facility] services per pt choice; start of care 24-48h post dc date.    Expected Discharge Date:  12/20/16               Expected Discharge Plan:  [place]  In-House Referral:     Discharge planning Services  CM Consult  Post Acute Care Choice:  Home Health Choice offered to:  Patient  DME Arranged:    DME Agency:     HH Arranged:  RN, PT [place] Agency:  [place]  Status of Service:  Completed, signed off  If discussed at [place] of Stay Meetings, dates discussed:    Additional Comments:  Reinaldo Raddle, RN, BSN  Trauma/Neuro ICU Case Manager [phone]

## 2016-12-20 NOTE — Progress Notes (Signed)
[place] paged. RN will continue to monitor patient.

## 2016-12-23 ENCOUNTER — Ambulatory Visit (INDEPENDENT_AMBULATORY_CARE_PROVIDER_SITE_OTHER): Payer: Medicare Other | Admitting: Pharmacist

## 2016-12-23 ENCOUNTER — Encounter: Payer: Self-pay | Admitting: Pharmacist

## 2016-12-23 DIAGNOSIS — Z794 Long term (current) use of insulin: Secondary | ICD-10-CM | POA: Diagnosis not present

## 2016-12-23 DIAGNOSIS — E1122 Type 2 diabetes mellitus with diabetic chronic kidney disease: Secondary | ICD-10-CM

## 2016-12-23 DIAGNOSIS — E118 Type 2 diabetes mellitus with unspecified complications: Secondary | ICD-10-CM | POA: Diagnosis not present

## 2016-12-23 DIAGNOSIS — N183 Chronic kidney disease, stage 3 unspecified: Secondary | ICD-10-CM

## 2016-12-23 DIAGNOSIS — F17200 Nicotine dependence, unspecified, uncomplicated: Secondary | ICD-10-CM

## 2016-12-23 LAB — POCT CBG (FASTING - GLUCOSE)-MANUAL ENTRY: Glucose Fasting, POC: 218 mg/dL — AB (ref 70–99)

## 2016-12-23 MED ORDER — ACCU-CHEK SOFTCLIX LANCETS MISC: 12 refills | Status: DC

## 2016-12-23 MED ORDER — INSULIN LISPRO 100 UNIT/ML (KWIKPEN): 3.0000 [IU] | PEN_INJECTOR | Freq: Three times a day (TID) | SUBCUTANEOUS | 2 refills | Status: DC

## 2016-12-23 MED ORDER — GLUCOSE BLOOD VI STRP: ORAL_STRIP | 12 refills | Status: DC

## 2016-12-23 NOTE — Assessment & Plan Note (Signed)
#  Diabetes Diabetes newly diagnosed, currently uncontrolled with an in-office fasting CBG of 218. Patient denies hypoglycemic events and is able to verbalize appropriate hypoglycemia management plan. Patient reports adherence with medication. Control is suboptimal due to recent infection, recent surgery (increased pain/stress), sedentary lifestyle, obesity, and dietary habits. Continued basal insulin Lantus (insulin glargine) at 16 units every morning. Started rapid insulin Humalog (insulin lispro) at 3 units, 3 times a day with meals. Patient educated on purpose, proper use and potential adverse effects of Humalog. Following instruction, patient verbalized understanding of treatment plan. Prescriptions were sent for test strips and lancets for her [place] device for patient to check BG 4x daily. Patient was advised to bring her meter to all of her follow-up appointments. Discussed healthy options for snacks as patient was unsure what would be the best choices for her to eat. Next A1C anticipated Feb 2019.

## 2016-12-23 NOTE — Progress Notes (Signed)
Patient ID: [name], female   DOB: 1966-01-10, 51 y.o.   MRN: [id number] Reviewed: Agree with Dr. Graylin Shiver documentation and management.

## 2016-12-23 NOTE — Assessment & Plan Note (Signed)
Tobacco Dependence Tobacco dependence longstanding, recently controlled as patient reports being tobacco-free for the past 7 days. Patient is motivated and 10/10 confident she will remain tobacco-free. Follow-up on smoking status at each visit.

## 2016-12-23 NOTE — Patient Instructions (Addendum)
Thanks for coming in to see [place] today!  1. We sent in prescriptions for your Accu-Chek lancets and [facility] Use those to check your sugars 4 times a day. Bring in your meter to each of your appointments.  2. We sent a prescription for your new insulin (Humalog) for you to use 3 units, 3 times a day before meals.  3. Continue all of your other medications as normal.  4. Continue to work on your smoking cessation. Great work on your 7 days tobacco-free!  Follow-up with Dr. Burr Medico in 1 week.

## 2016-12-23 NOTE — Progress Notes (Signed)
S:    Chief Complaint  Patient presents with  . Medication Management    Diabetes and smoking cessation   Patient arrives in good spirits, ambulating with the assistance of a cane following recent surgery on her left foot (on 12/18/16).  Presents for diabetes and smoking cessation evaluation, education, and management at the request of Dr. Burr Medico. Patient was referred on 12/11/16.  Patient was last seen by Primary Care Provider on 12/11/16. Patient was hospitalized with hyperglycemia on 12/12/16 with BG >900. Patient was hospitalized with multiple fractures in her left foot and infection for surgery on 12/16/16, and discharged on 12/20/16.  Patient reports Diabetes was diagnosed on 12/11/16 with an A1c of 9.3% (was 5.7% 1-year prior) and an in-office CBG of >600 (meter only goes up to 600).   Patient reports adherence with medications.  Current diabetes medications include: Lantus 16 units every morning. Current hypertension medications include: amlodipine 10 mg daily, hctz 25 mg daily, and losartan 100 mg daily.  Patient denies hypoglycemic events.  Patient reported dietary habits: Eats 3 meals/day Breakfast: sausage and cheese McMuffin and Sprite Zero (because of the cheap price) Lunch: leftovers from the dinner before Dinner: baked chicken, rice, string beans, with butter Drinks: Sprite Zero, water, [employer] manager tea packets, Powerade Zero Patient reports being on a "baked diet" of chicken and fish.   Patient reported exercise habits: walking around her house, but limited as she just had surgery on her foot. Patient is looking forward to being able to walk more when her boot comes off.   Patient reports nocturia x2/night - significantly better than in the past (she was waking every hour). Patient denies neuropathy. Patient denies visual changes. Reports going to the eye doctor next month.  Patient reports self foot exams. No issues to report.  Patient reports not  smoking for the past 7 days. Patient reports using prayer to occupy her time instead of smoking and got rid of all of her tobacco products. Patient is motivated to stay tobacco-free. Patient's confidence to remain tobacco-free through her next appointment with Dr. Burr Medico next week is 10/10.  O:  Physical Exam  Constitutional: She appears well-developed and well-nourished.   Review of Systems  All other systems reviewed and are negative.  Lab Results  Component Value Date   HGBA1C 9.3 12/05/2016   Vitals:   12/23/16 1058  BP: (!) 122/105  Pulse: (!) 108   Home CBGs: patient has not been checking at home because she did not have the proper test strips for her Accu-Chek [employer]iva device.  In-office fasting CBG: 218.  10 year ASCVD risk: 9.8%; with her as a former smoker and using her lipid panel from 05/03/16.  A/P: #Diabetes Diabetes newly diagnosed, currently uncontrolled with an in-office fasting CBG of 218. Patient denies hypoglycemic events and is able to verbalize appropriate hypoglycemia management plan. Patient reports adherence with medication. Control is suboptimal due to recent infection, recent surgery (increased pain/stress), sedentary lifestyle, obesity, and dietary habits. Continued basal insulin Lantus (insulin glargine) at 16 units every morning. Started rapid insulin Humalog (insulin lispro) at 3 units, 3 times a day with meals. Patient educated on purpose, proper use and potential adverse effects of Humalog. Following instruction, patient verbalized understanding of treatment plan. Prescriptions were sent for test strips and lancets for her [place] device for patient to check BG 4x daily. Patient was advised to bring her meter to all of her follow-up appointments. Discussed healthy options for snacks  as patient was unsure what would be the best choices for her to eat. Next A1C anticipated Feb 2019.     #HLD ASCVD risk greater than 7.5%. Continued atorvastatin 40 mg daily.  Consider the addition of aspirin 81 mg daily at her next appointment.  #HTN Hypertension longstanding currently uncontrolled.  Patient reports adherence with medication. Control is suboptimal due to increased recent stress, obesity, sedentary lifestyle, uncontrolled DM, and dietary habits. No changes were made to her HTN medications at this time as the focus was on controlling her BG and DM today.  #Tobacco Dependence Tobacco dependence longstanding, recently controlled as patient reports being tobacco-free for the past 7 days. Patient is motivated and 10/10 confident she will remain tobacco-free. Follow-up on smoking status at each visit.  Written patient instructions provided.  Total time in face to face counseling 35 minutes.   Follow up with Dr. Burr Medico in 1 week.   Patient seen with Drusilla Kanner, PharmD Candidate.

## 2016-12-24 ENCOUNTER — Ambulatory Visit (INDEPENDENT_AMBULATORY_CARE_PROVIDER_SITE_OTHER): Payer: Medicare Other | Admitting: Orthopaedic Surgery

## 2016-12-24 DIAGNOSIS — S92902D Unspecified fracture of left foot, subsequent encounter for fracture with routine healing: Secondary | ICD-10-CM

## 2016-12-24 DIAGNOSIS — S92502B Displaced unspecified fracture of left lesser toe(s), initial encounter for open fracture: Secondary | ICD-10-CM

## 2016-12-24 MED ORDER — MUPIROCIN 2 % EX OINT: 1.0000 "application " | TOPICAL_OINTMENT | Freq: Two times a day (BID) | CUTANEOUS | 0 refills | Status: DC

## 2016-12-24 MED ORDER — HYDROCODONE-ACETAMINOPHEN 7.5-325 MG PO TABS: 1.0000 | ORAL_TABLET | Freq: Every day | ORAL | 0 refills | Status: DC | PRN

## 2016-12-24 NOTE — Progress Notes (Signed)
Azaliyah follows up today for her foot injuries.  She is doing well overall.  Her incisions are clean dry and intact without signs of infection.  Continue Bactrim and Bactroban ointment.  Weight-bear as tolerated in a fracture boot.  Follow-up in 2 weeks for suture removal and three-view x-rays of the left foot.

## 2016-12-27 ENCOUNTER — Other Ambulatory Visit: Payer: Self-pay

## 2016-12-27 ENCOUNTER — Ambulatory Visit (INDEPENDENT_AMBULATORY_CARE_PROVIDER_SITE_OTHER): Payer: Medicare Other | Admitting: Family Medicine

## 2016-12-27 ENCOUNTER — Encounter: Payer: Self-pay | Admitting: Family Medicine

## 2016-12-27 VITALS — BP 112/68 | HR 82 | Temp 98.5°F | Ht 70.0 in | Wt 371.2 lb

## 2016-12-27 DIAGNOSIS — E118 Type 2 diabetes mellitus with unspecified complications: Secondary | ICD-10-CM | POA: Diagnosis present

## 2016-12-27 DIAGNOSIS — Z23 Encounter for immunization: Secondary | ICD-10-CM

## 2016-12-27 DIAGNOSIS — Z794 Long term (current) use of insulin: Secondary | ICD-10-CM

## 2016-12-27 DIAGNOSIS — S92902D Unspecified fracture of left foot, subsequent encounter for fracture with routine healing: Secondary | ICD-10-CM

## 2016-12-27 LAB — GLUCOSE, POCT (MANUAL RESULT ENTRY): POC GLUCOSE: 168 mg/dL — AB (ref 70–99)

## 2016-12-27 MED ORDER — INSULIN GLARGINE 100 UNIT/ML SOLOSTAR PEN: 18.0000 [IU] | PEN_INJECTOR | SUBCUTANEOUS | 99 refills | Status: DC

## 2016-12-27 NOTE — Progress Notes (Signed)
    Subjective:  [name] is a 51 y.o. female who presents to the [facility] today with a chief complaint of hospital followup.   She is here for hospital followup from a fall in which she had multiple closed fractures in her left distal foot.   There was surgical debridement and she still has stitches under 4 toes and is walking with a boot.   No complaints of loss of sensation, she has been ambulating, no warmess in foot, swelling still present but steadily improving.  She also has a new diagnosis of DM2 and has been consistent in keeping logs since hospital d/c.   Her regimen has been 18 lantus am and 3units lispro with meals.   Fasting glucose in am has been ~200.   She has a diabetic eye appt scheduled and we discussed importance of keeping the appt.  Objective:  Physical Exam: BP 112/68   Pulse 82   Temp 98.5 F (36.9 C) (Oral)   Ht 5\' 10"  (1.778 m)   Wt (!) 371 lb 3.2 oz (168.4 kg)   LMP 04/10/2006   SpO2 96%   BMI 53.26 kg/m   Gen: NAD, resting comfortably CV: RRR with no murmurs appreciated Pulm: NWOB, CTAB with no crackles, wheezes, or rhonchi GI: Normal bowel sounds present. Soft, Nontender, Nondistended. MSK: wounds were dry with no drainage or warmth, stitches intact.   Foot was mildly swollen although she says improved and slightly bruised.   Cap refill and distal sensation intact on left foot.  She ambulated with no assistance using boot Skin: warm, dry Neuro: grossly normal, moves all extremities Psych: Normal affect and thought content  Results for orders placed or performed in visit on 12/27/16 (from the past 72 hour(s))  Glucose (CBG)     Status: Abnormal   Collection Time: 12/27/16  9:09 AM  Result Value Ref Range   POC Glucose 168 (A) 70 - 99 mg/dl     Assessment/Plan:  Type 2 diabetes mellitus with complications (HCC) Lantus 18 (increased from 16) and meal time TID of 3units lispro  On 16 lantus her fastings had been ~200 so we increasing to 18units in  the AM.   Counseling given on hypoglycemia but none of her TID cbgs had been <149 to date  Multiple closed fractures of left foot Wounds under toes are healing well, w/ no sign of erythema/infection.   Cap refill and sensation intact.   Still some generalized swelling/bruising to foot.  Will continue to ice, has been ambulating in boot with no major difficulties.  Need for prophylactic vaccination against Streptococcus pneumoniae (pneumococcus) Had 23shot, is getting 13 today.   She is being told to f/u in 1 month for diabetes management and scheduled for a wellness annual with Ms. Ducatte.  Sherene Sires, DO FAMILY MEDICINE RESIDENT - PGY1 12/27/2016 9:42 AM

## 2016-12-27 NOTE — Progress Notes (Signed)
B

## 2016-12-27 NOTE — Patient Instructions (Signed)
It was a pleasure to see you today! Thank you for choosing [facility] for your primary care. [name] was seen for hospital followup. Come back to the clinic in 1 month to check in on your diabetes managhement and faster if you have questions or medication needs, and go to the emergency room if you have any life threatening concerns  Your foot is healing well.   Continue to ice and keep clean.  Call us immediately if it starts to turn red or gets warm or you lose any feeling.  Your diabetes management is starting off good because you are being consistent and regular in your medication.   We are increasing your morning lantus to 18 and keeping your meal time lispro at 3units per meal.   Please keep track of your sugar levels and bring your logs to the next visit.   Please keep your eye appt that you told me about.  We gave you a pneumococcal vaccine today.  We are also asking you to schedule an annual well visit with Ms. Ducatte so she can help keep track of your health prevention items.   This is a covered visit and you will really be able to spend some good time with her.  If we did any lab work today, and the results require attention, either me or my nurse will get in touch with you. If everything is normal, you will get a letter in mail and a message via . If you don't hear from [place] in two weeks, please give [place] a call. Otherwise, we look forward to seeing you again at your next visit. If you have any questions or concerns before then, please call the clinic at [phone].  Please bring all your medications to every doctors visit  Sign up for My Chart to have easy access to your labs results, and communication with your Primary care physician.    Please check-out at the front desk before leaving the clinic.    Best,  Dr. Sherene Sires FAMILY MEDICINE RESIDENT - PGY1 12/27/2016 9:34 AM

## 2016-12-27 NOTE — Assessment & Plan Note (Signed)
Wounds under toes are healing well, w/ no sign of erythema/infection.   Cap refill and sensation intact.   Still some generalized swelling/bruising to foot.  Will continue to ice, has been ambulating in boot with no major difficulties.

## 2016-12-27 NOTE — Assessment & Plan Note (Signed)
Lantus 18 (increased from 16) and meal time TID of 3units lispro  On 16 lantus her fastings had been ~200 so we increasing to 18units in the AM.   Counseling given on hypoglycemia but none of her TID cbgs had been <149 to date

## 2016-12-27 NOTE — Assessment & Plan Note (Signed)
Had 23shot, is getting 13 today.

## 2017-01-01 ENCOUNTER — Telehealth (INDEPENDENT_AMBULATORY_CARE_PROVIDER_SITE_OTHER): Payer: Self-pay | Admitting: Orthopaedic Surgery

## 2017-01-01 MED ORDER — HYDROCODONE-ACETAMINOPHEN 7.5-325 MG PO TABS: ORAL_TABLET | ORAL | 0 refills | Status: DC

## 2017-01-01 NOTE — Telephone Encounter (Signed)
Patient left a message requesting an RX refill on her Hydrocodone.  CB#[phone].  Thank you.

## 2017-01-01 NOTE — Telephone Encounter (Signed)
Is this okay to fill? If so, how many and instructions?

## 2017-01-01 NOTE — Telephone Encounter (Signed)
#  20.  1-2 tabs po daily prn

## 2017-01-01 NOTE — Telephone Encounter (Signed)
Rx pending Signature. Will be ready for pick up tomorrow AM.

## 2017-01-02 ENCOUNTER — Telehealth (INDEPENDENT_AMBULATORY_CARE_PROVIDER_SITE_OTHER): Payer: Self-pay | Admitting: Orthopaedic Surgery

## 2017-01-02 NOTE — Telephone Encounter (Signed)
Called patient. Patient aware.

## 2017-01-06 ENCOUNTER — Ambulatory Visit (INDEPENDENT_AMBULATORY_CARE_PROVIDER_SITE_OTHER): Payer: Medicare Other | Admitting: Orthopaedic Surgery

## 2017-01-07 ENCOUNTER — Ambulatory Visit: Payer: Medicare Other | Admitting: Student in an Organized Health Care Education/Training Program

## 2017-01-08 ENCOUNTER — Ambulatory Visit: Payer: Medicare Other | Admitting: Student in an Organized Health Care Education/Training Program

## 2017-01-09 ENCOUNTER — Encounter (INDEPENDENT_AMBULATORY_CARE_PROVIDER_SITE_OTHER): Payer: Self-pay | Admitting: Specialist

## 2017-01-09 ENCOUNTER — Ambulatory Visit (INDEPENDENT_AMBULATORY_CARE_PROVIDER_SITE_OTHER): Payer: Self-pay

## 2017-01-09 ENCOUNTER — Ambulatory Visit (INDEPENDENT_AMBULATORY_CARE_PROVIDER_SITE_OTHER): Payer: Medicare Other | Admitting: Specialist

## 2017-01-09 VITALS — BP 137/95 | HR 102 | Ht 70.0 in | Wt 368.0 lb

## 2017-01-09 DIAGNOSIS — M25561 Pain in right knee: Secondary | ICD-10-CM | POA: Diagnosis not present

## 2017-01-09 DIAGNOSIS — S83411A Sprain of medial collateral ligament of right knee, initial encounter: Secondary | ICD-10-CM

## 2017-01-09 DIAGNOSIS — Z981 Arthrodesis status: Secondary | ICD-10-CM

## 2017-01-09 DIAGNOSIS — M545 Low back pain: Secondary | ICD-10-CM

## 2017-01-09 DIAGNOSIS — M48062 Spinal stenosis, lumbar region with neurogenic claudication: Secondary | ICD-10-CM

## 2017-01-09 MED ORDER — HYDROCODONE-ACETAMINOPHEN 7.5-325 MG PO TABS: ORAL_TABLET | ORAL | 0 refills | Status: DC

## 2017-01-09 NOTE — Progress Notes (Signed)
Office Visit Note   Patient: [name]           Date of Birth: [birth year]           MRN: [id number] Visit Date: 01/09/2017              Requested by: [name], MD [address] [place], [place] [address] PCP: [name], MD   Assessment & Plan: Visit Diagnoses:  1. Right knee pain, unspecified chronicity   2. Low back pain, unspecified back pain laterality, unspecified chronicity, with sciatica presence unspecified     Plan: Knee is suffering from osteoarthritis, only real proven treatments are Weight loss, and exercise. Well padded shoes help. Ice the knee 2-3 times a day 15-20 mins at a time. Go to therapy to work on right knee ROM and strength and improved gait. Hydrocodone for pain. Avoid NSAIDs due to kidney dysfunction. No steroids due to diabetes.   Follow-Up Instructions: No Follow-up on file.   Orders:  Orders Placed This Encounter  Procedures  . XR Knee 1-2 Views Right  . XR Lumbar Spine 2-3 Views   No orders of the defined types were placed in this encounter.     Procedures: No procedures performed   Clinical Data: No additional findings.   Subjective: Chief Complaint  Patient presents with  . Lower Back - Follow-up, Injury, Pain  . Right Knee - Follow-up, Injury, Pain    51 year old female with history of lumbar spinal stenosis, last seen in 11/2016 with lumbar spinal stenosis and symptoms of neurogenic claudication. She has had recent surgeries of the hands for trigger finger. Had injection of the spine for stenosis 10/18. Her blood glucose level went from 140 to 900+. She was sent to the ER and admitted for diabetes out of control. She has been found to have renal changes and is having difficulty with bilateral knee pain.     Review of Systems  Constitutional: Negative.   HENT: Negative.   Eyes: Negative.   Respiratory: Negative.   Cardiovascular: Negative.   Gastrointestinal: Negative.   Endocrine: Negative.     Genitourinary: Negative.   Musculoskeletal: Negative.   Skin: Negative.   Allergic/Immunologic: Negative.   Neurological: Negative.   Hematological: Negative.   Psychiatric/Behavioral: Negative.      Objective: Vital Signs: BP (!) 137/95 (BP Location: Left Arm, Patient Position: Sitting)   Pulse (!) 102   Ht 5\' 10"  (1.778 m)   Wt (!) 368 lb (166.9 kg)   LMP 04/10/2006   BMI 52.80 kg/m   Physical Exam  Constitutional: She is oriented to person, place, and time. She appears well-developed and well-nourished.  HENT:  Head: Normocephalic and atraumatic.  Eyes: EOM are normal. Pupils are equal, round, and reactive to light.  Neck: Normal range of motion. Neck supple.  Pulmonary/Chest: Effort normal and breath sounds normal.  Abdominal: Soft. Bowel sounds are normal.  Neurological: She is alert and oriented to person, place, and time.  Skin: Skin is warm and dry.  Psychiatric: She has a normal mood and affect. Her behavior is normal. Judgment and thought content normal.    Back Exam   Tenderness  The patient is experiencing tenderness in the lumbar.  Range of Motion  Extension: abnormal  Flexion: abnormal  Lateral bend right: normal  Rotation right: normal   Muscle Strength  Right Quadriceps:  5/5  Left Quadriceps:  5/5  Right Hamstrings:  5/5  Left Hamstrings:  5/5  Reflexes  Patellar: normal Achilles: normal Babinski's sign: normal   Other  Toe walk: abnormal Heel walk: abnormal Sensation: normal Gait: abnormal  Erythema: no back redness Scars: absent      Specialty Comments:  No specialty comments available.  Imaging: No results found.   PMFS History: Patient Active Problem List   Diagnosis Date Noted  . Need for prophylactic vaccination against Streptococcus pneumoniae (pneumococcus) 12/27/2016  . Fracture of fifth toe, left, open, initial encounter 12/18/2016  . Hyperosmolar syndrome 12/17/2016  . Hyperglycemia 12/16/2016  . Laceration  of left foot   . Metabolic acidosis, increased anion gap   . Multiple closed fractures of left foot   . Hyponatremia   . Pressure injury of skin 12/13/2016  . Hypokalemia   . AKI (acute kidney injury) ([place])   . Type 2 diabetes mellitus with stage 3 chronic kidney disease, with long-term current use of insulin ([place]) 12/12/2016  . Type 2 diabetes mellitus with complications ([place]) 26/33/3545  . Yeast infection 12/11/2016  . CKD (chronic kidney disease) stage 3, GFR 30-59 ml/min (HCC) 12/11/2016  . Urge incontinence of urine 12/05/2016  . Need for immunization against influenza 11/25/2016  . Stenosing tenosynovitis of finger of left hand 06/28/2016  . Prediabetes 05/09/2015  . Substance abuse ([place]) 11/01/2013  . Obesity 10/02/2012  . Chronic diastolic CHF (congestive heart failure) ([place]) 04/07/2012  . Allergic rhinitis 03/29/2011  . GERD 01/26/2010  . MIGRAINE HEADACHE 10/25/2009  . DYSFUNCTIONAL UTERINE BLEEDING 10/25/2009  . ALOPECIA 06/14/2009  . Hypertension 08/04/2008  . OBSTRUCTIVE SLEEP APNEA 02/03/2008  . FIBROCYSTIC BREAST DISEASE 10/28/2006  . Hyperlipidemia 03/27/2006  . Morbid obesity ([place]) 03/27/2006  . TOBACCO DEPENDENCE 03/27/2006  . Depression with anxiety 03/27/2006  . NEUROGENIC BLADDER 03/27/2006  . Dyspnea 03/27/2006   Past Medical History:  Diagnosis Date  . Arthritis 04-10-11   hips, shoulders, back  . Asthma   . Bipolar disorder ([place])   . Cancer ([place]) 1993   cervical, no treatment done, went away per pt  . Cervical dysplasia or atypia 04-10-11   '93- once dx.-got pregnant-no intervention, then postpartum, no dysplasia found  . CHF (congestive heart failure) ([place])    no cardiologist 2014 dx, none now  . 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 Pathology showed Condyloma, gluteal CONDYLOMA ACUMINATUM.   [name] Kitchen COPD (chronic obstructive pulmonary disease) ([place])   . Depression   . Diabetes mellitus without complication ([place])   .  Dyspnea    with actity - better since taking Singular  . Fall 12/16/2016  . Fibrocystic breast disease   . GERD (gastroesophageal reflux disease)   . Hypercalcemia   . Hyperlipidemia   . Hypertension   . Migraine headache    none recent  . Morbid obesity ([place]) 03/27/2006  . Neuropathy   . Skin lesion 03/15/2011   In gluteal crease now s/p removal by Dr. [name] of General Surgery on 3/12. Path shows condyloma.     . Sleep apnea 04-10-11   uses cpap, pt does not know settings  . Trigger finger    left third    Family History  Problem Relation Age of Onset  . Stroke Father   . Cancer Maternal Aunt        breast    Past Surgical History:  Procedure Laterality Date  . ANAL FISTULECTOMY  04/17/2011   Procedure: FISTULECTOMY ANAL;  Surgeon: Imogene Burn. [name], MD;  Location: [facility];  Service: General;  Laterality: N/A;  Excision of Condyloma Gluteal Cleft   . BACK SURGERY  04-10-11   x5-Lumbar fusion-retained hardware.(Dr. Louanne Skye)  . BREAST CYST EXCISION     bilateral breast, 3 cysts removed from each breast  . BREAST EXCISIONAL BIOPSY Right    x 3  . BREAST EXCISIONAL BIOPSY Left    x 3  . CARPAL TUNNEL RELEASE Bilateral   . Cervical biospy    . COLONOSCOPY WITH PROPOFOL N/A 07/06/2015   Procedure: COLONOSCOPY WITH PROPOFOL;  Surgeon: [name], MD;  Location: [facility];  Service: Endoscopy;  Laterality: N/A;  . DOPPLER ECHOCARDIOGRAPHY  04/06/2012   AT [place] 55-60%  . I&D EXTREMITY Left 12/18/2016   Procedure: IRRIGATION AND DEBRIDEMENT LEFT FOOT, CLOSURE;  Surgeon: [name], MD;  Location: [facility];  Service: Orthopedics;  Laterality: Left;  [name] Kitchen MULTIPLE TOOTH EXTRACTIONS    . NECK SURGERY  04-10-11   x3- cervical fusion with plating and screws-Dr. [name]  . TEE WITHOUT CARDIOVERSION N/A 04/06/2012   Procedure: TRANSESOPHAGEAL ECHOCARDIOGRAM (TEE);  Surgeon: [name], MD;  Location[facility];  Service: Cardiovascular;  Laterality: N/A;  . TRIGGER FINGER  RELEASE Right    middle finger  . TRIGGER FINGER RELEASE Left 06/28/2016   Procedure: RELEASE TRIGGER FINGER LEFT 3RD FINGER;  Surgeon: [name], MD;  Location: [facility];  Service: Orthopedics;  Laterality: Left;  . TRIGGER FINGER RELEASE Right 06/12/2016   Procedure: RIGHT INDEX FINGER TRIGGER RELEASE;  Surgeon: [name], MD;  Location: [facility];  Service: Orthopedics;  Laterality: Right;   Social History   Occupational History  . Not on file  Tobacco Use  . Smoking status: Former Smoker    Packs/day: 0.00    Years: 38.00    Pack years: 0.00    Types: Cigarettes    Start date: 01/28/1977    Last attempt to quit: 12/16/2016    Years since quitting: 0.0  . Smokeless tobacco: Never Used  . Tobacco comment: Previous 1 ppd,  Quit during hospit. 12/16/2016  Substance and Sexual Activity  . Alcohol use: No    Alcohol/week: 0.0 oz  . Drug use: Yes    Types: Marijuana, "Crack" cocaine    Comment: hx marijuana usemany years ago, Crack -none since 11/2015  . Sexual activity: No

## 2017-01-09 NOTE — Patient Instructions (Signed)
  Knee is suffering from osteoarthritis, only real proven treatments are Weight loss, and exercise. Well padded shoes help. Ice the knee 2-3 times a day 15-20 mins at a time. Go to therapy to work on right knee ROM and strength and improved gait. Hydrocodone for pain. Avoid NSAIDs due to kidney dysfunction. No steroids due to diabetes.

## 2017-01-09 NOTE — Addendum Note (Signed)
Addended by: [name] on: 01/09/2017 11:47 AM   Modules accepted: Orders

## 2017-01-10 ENCOUNTER — Ambulatory Visit (INDEPENDENT_AMBULATORY_CARE_PROVIDER_SITE_OTHER): Payer: Self-pay

## 2017-01-10 ENCOUNTER — Encounter (INDEPENDENT_AMBULATORY_CARE_PROVIDER_SITE_OTHER): Payer: Self-pay | Admitting: Orthopaedic Surgery

## 2017-01-10 ENCOUNTER — Ambulatory Visit (INDEPENDENT_AMBULATORY_CARE_PROVIDER_SITE_OTHER): Payer: Medicare Other | Admitting: Orthopaedic Surgery

## 2017-01-10 DIAGNOSIS — S92502B Displaced unspecified fracture of left lesser toe(s), initial encounter for open fracture: Secondary | ICD-10-CM | POA: Diagnosis not present

## 2017-01-10 DIAGNOSIS — S92902D Unspecified fracture of left foot, subsequent encounter for fracture with routine healing: Secondary | ICD-10-CM

## 2017-01-10 NOTE — Progress Notes (Signed)
Patient is approximately 3 weeks status post multiple foot fractures.  She is doing well overall.  Lacerations have healed without any complication.  Her x-rays show evidence of healing and callus formation.  At this point she may be transitioned to a postop shoe which she can start weaning after a couple weeks and as her symptoms allow.  Weight-bear as tolerated.  Follow-up in 6 weeks with repeat three-view x-rays of the left foot.  Sutures removed today.  Continue Bactroban ointment to the foot.

## 2017-01-13 ENCOUNTER — Encounter: Payer: Self-pay | Admitting: Registered"

## 2017-01-13 ENCOUNTER — Encounter: Payer: Medicare Other | Attending: Specialist | Admitting: Registered"

## 2017-01-13 DIAGNOSIS — M48062 Spinal stenosis, lumbar region with neurogenic claudication: Secondary | ICD-10-CM | POA: Diagnosis not present

## 2017-01-13 DIAGNOSIS — Z981 Arthrodesis status: Secondary | ICD-10-CM | POA: Diagnosis not present

## 2017-01-13 DIAGNOSIS — Z713 Dietary counseling and surveillance: Secondary | ICD-10-CM | POA: Diagnosis not present

## 2017-01-13 DIAGNOSIS — E119 Type 2 diabetes mellitus without complications: Secondary | ICD-10-CM

## 2017-01-13 NOTE — Patient Instructions (Addendum)
-   Fasting blood sugar numbers goal 80-130 and 2 hours after meals goal is less than 180.   - Aim to get at least 30 min of physical activity at least 3 days/week.  - Aim for at least 3 meals/day with snacks between meals. Avoid skipping.   - Plan meals/snacks ahead of time. Keep snacks on hand.

## 2017-01-13 NOTE — Progress Notes (Signed)
  Medical Nutrition Therapy:  Appt start time: 2:45 end time:  3:55.   Assessment:  Primary concerns today:  Pt states she was just diagnosed with diabetes. Pt states she checks BS 4x/day: FBS (174-220) and after meals (190-300). Pt states she just started receiving EBT benefits. Pt states right knee often gives out on her and she has a bad back. Pt states she is too avoid sugar and sweets. Pt states she broke her toes recently due to recent fall; still recovering. Pt states she likes to swim. Pt states she is used to eating one meal a day, trying to eat 3 meals a day along with snacks. Pt states she is becoming more lactose intolerant; drinks 2% milk. Pt states she treats herself once a week to dill pickle chips. Pt reports changing from sprite to sprite zero.    Preferred Learning Style:   No preference indicated   Learning Readiness:   Ready  Change in progress   MEDICATIONS: See list   DIETARY INTAKE:  Usual eating pattern includes 1 meals and 0 snacks per day.  Everyday foods include fish, chicken.  Avoided foods include sweets and sugar.    24-hr recall:  B ( AM): sausage and egg biscuit  Snk ( AM): none  L ( PM): skips; apple Snk ( PM): none D ( PM): pasta, italian dressing, cucumbers, shrimp Snk ( PM): peanut butter crackers, cheese crackers Beverages: water, powerade ion, flavored water packs  Usual physical activity: none stated  Estimated energy needs: 1600 calories 180 g carbohydrates 120 g protein 44 g fat  Progress Towards Goal(s):  In progress.   Nutritional Diagnosis:  NI-5.8.4 Inconsistent carbohydrate intake As related to diabetes mellitus.  As evidenced by pt report of usually eating 1 meal a day and pt verablizes incomplete knowledge.    Intervention:  Nutrition education and counseling. Pt was educated and counseled on importance of eating 3 meals a day, importance of checking BS sugar multiple times a day, importance of physical activity, and how  to eat well-balanced meals. Goals: - Fasting blood sugar numbers goal 80-130 and 2 hours after meals goal is less than 180.  - Aim to get at least 30 min of physical activity at least 3 days/week. - Aim for at least 3 meals/day with snacks between meals. Avoid skipping.  - Plan meals/snacks ahead of time. Keep snacks on hand.   Teaching Method Utilized:  Visual Auditory Hands on  Handouts given during visit include:  Planning healthy meals  Silver Sneakers information  Barriers to learning/adherence to lifestyle change: none  Demonstrated degree of understanding via:  Teach Back   Monitoring/Evaluation:  Dietary intake, exercise, and body weight in 2 month(s).

## 2017-01-14 ENCOUNTER — Encounter: Payer: Self-pay | Admitting: Student in an Organized Health Care Education/Training Program

## 2017-01-14 ENCOUNTER — Ambulatory Visit (INDEPENDENT_AMBULATORY_CARE_PROVIDER_SITE_OTHER): Payer: Medicare Other | Admitting: Student in an Organized Health Care Education/Training Program

## 2017-01-14 ENCOUNTER — Ambulatory Visit: Payer: Medicare Other | Attending: Specialist | Admitting: Physical Therapy

## 2017-01-14 ENCOUNTER — Other Ambulatory Visit: Payer: Self-pay

## 2017-01-14 VITALS — BP 118/60 | HR 103 | Temp 98.0°F | Ht 70.0 in | Wt 375.4 lb

## 2017-01-14 DIAGNOSIS — B351 Tinea unguium: Secondary | ICD-10-CM | POA: Diagnosis not present

## 2017-01-14 DIAGNOSIS — M545 Low back pain: Secondary | ICD-10-CM | POA: Diagnosis not present

## 2017-01-14 DIAGNOSIS — N184 Chronic kidney disease, stage 4 (severe): Secondary | ICD-10-CM | POA: Diagnosis not present

## 2017-01-14 DIAGNOSIS — E1122 Type 2 diabetes mellitus with diabetic chronic kidney disease: Secondary | ICD-10-CM | POA: Diagnosis not present

## 2017-01-14 DIAGNOSIS — R293 Abnormal posture: Secondary | ICD-10-CM | POA: Diagnosis not present

## 2017-01-14 DIAGNOSIS — N183 Chronic kidney disease, stage 3 unspecified: Secondary | ICD-10-CM

## 2017-01-14 DIAGNOSIS — R262 Difficulty in walking, not elsewhere classified: Secondary | ICD-10-CM

## 2017-01-14 DIAGNOSIS — M6281 Muscle weakness (generalized): Secondary | ICD-10-CM

## 2017-01-14 DIAGNOSIS — Z794 Long term (current) use of insulin: Secondary | ICD-10-CM | POA: Diagnosis not present

## 2017-01-14 DIAGNOSIS — M25572 Pain in left ankle and joints of left foot: Secondary | ICD-10-CM

## 2017-01-14 DIAGNOSIS — M25561 Pain in right knee: Secondary | ICD-10-CM

## 2017-01-14 DIAGNOSIS — M25672 Stiffness of left ankle, not elsewhere classified: Secondary | ICD-10-CM

## 2017-01-14 NOTE — Progress Notes (Signed)
   CC: Diabetes follow-up  HPI: [name] is a 51 y.o. female with PMH significant for type 2 diabetes, hypertension, obesity, recent weight gain, who presents to [facility] today for diabetes follow-up.   DIABETES Disease Monitoring: Last A1c was 9.3,11/8 Blood Sugar ranges-patient has chart with blood Sugar ranges. Lowest a.m. blood sugar is 134, however more often 190s. Polyuria/phagia/dipsia- endorses, however improved       Visual problems- denies Medications: Lantus 18 units every morning, 3 units of short acting with each meal Compliance- 100% reported  Hypoglycemic symptoms- denies  Patient has been following with nutrition. She was asked to take blood sugars 2 hours after her meals. She has been taking blood sugars before and 2 hours after meals, so checking her blood sugar 6 times per day. She asks if she can decrease the number of times she is checking her blood sugar per day.  Additionally she notes calluses on her feet and asks for podiatry referral because when she she isn't on her feet bleed.  Monitoring Labs and Parameters Last A1C:  Lab Results  Component Value Date   HGBA1C 9.3 12/05/2016   Last BPs:  BP Readings from Last 3 Encounters:  01/14/17 118/60  01/09/17 (!) 137/95  12/27/16 112/68   Review of Symptoms:  See HPI for ROS.   CC, SH/smoking status, and VS noted.  Objective: BP 118/60   Pulse (!) 103   Temp 98 F (36.7 C) (Oral)   Ht 5\' 10"  (1.778 m)   Wt (!) 375 lb 6.4 oz (170.3 kg)   LMP 04/10/2006   SpO2 93%   BMI 53.86 kg/m  GEN: NAD, alert, cooperative, and pleasant. RESPIRATORY: clear to auscultation bilaterally with no wheezes, rhonchi or rales, good effort CV: RRR, no m/r/g, no peripheral edema GI: soft, non-tender, non-distended, no hepatosplenomegaly PSYCH: AAOx3, appropriate affect  Assessment and plan:  Type 2 diabetes mellitus with stage 3 chronic kidney disease, with long-term current use of insulin (HCC) - Based on a.m. blood  sugars, it is safe to increase Lantus from 18 units in the morning to 20 units in the morning - Encourage patient to check blood sugars either before or 2 hours after meals, but to stay consistent  --- Checking blood sugars 6 times per day is likely to contribute to burnout from checking sugars additionally intern and is unlikely to pay for supplies for this many checks. - She should check blood sugars if she is ever symptomatic - Gave her chart for recording blood sugars - Follow-up in one month - Anticipate next A1c February 2018  Orders Placed This Encounter  Procedures  . Ambulatory referral to Podiatry    Referral Priority:   Routine    Referral Type:   Consultation    Referral Reason:   Specialty Services Required    Requested Specialty:   Podiatry    Number of Visits Requested:   1   [name], MD,MS,  PGY2 01/14/2017 4:43 PM

## 2017-01-14 NOTE — Assessment & Plan Note (Addendum)
-   Based on a.m. blood sugars, it is safe to increase Lantus from 18 units in the morning to 20 units in the morning - Encourage patient to check blood sugars either before or 2 hours after meals, but to stay consistent  --- Checking blood sugars 6 times per day is likely to contribute to burnout from checking sugars additionally intern and is unlikely to pay for supplies for this many checks. - She should check blood sugars if she is ever symptomatic - Gave her chart for recording blood sugars - Follow-up in one month - Anticipate next A1c February 2018

## 2017-01-14 NOTE — Therapy (Signed)
[place], [place], [address] Phone: [phone]   Fax:  [phone]  Physical Therapy Evaluation  Patient Details  Name: [name] MRN: [id number] Date of Birth: October 12, 1965 Referring Provider: Dr. [name]   Encounter Date: 01/14/2017  PT End of Session - 01/14/17 0848    Visit Number  1    Number of Visits  16    Date for PT Re-Evaluation  03/11/17    PT Start Time  0802    PT Stop Time  0846    PT Time Calculation (min)  44 min    Activity Tolerance  Patient tolerated treatment well    Behavior During Therapy  [facility] for tasks assessed/performed       Past Medical History:  Diagnosis Date  . Arthritis 04-10-11   hips, shoulders, back  . Asthma   . Bipolar disorder ([place])   . Cancer ([place]) 1993   cervical, no treatment done, went away per pt  . Cervical dysplasia or atypia 04-10-11   '93- once dx.-got pregnant-no intervention, then postpartum, no dysplasia found  . CHF (congestive heart failure) ([place])    no cardiologist 2014 dx, none now  . 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 Pathology showed Condyloma, gluteal CONDYLOMA ACUMINATUM.   [name] Kitchen COPD (chronic obstructive pulmonary disease) ([place])   . Depression   . Diabetes mellitus without complication ([place])   . Dyspnea    with actity - better since taking Singular  . Fall 12/16/2016  . Fibrocystic breast disease   . GERD (gastroesophageal reflux disease)   . Hypercalcemia   . Hyperlipidemia   . Hypertension   . Migraine headache    none recent  . Morbid obesity ([place]) 03/27/2006  . Neuropathy   . Skin lesion 03/15/2011   In gluteal crease now s/p removal by Dr. [name] of General Surgery on 3/12. Path shows condyloma.     . Sleep apnea 04-10-11   uses cpap, pt does not know settings  . Trigger finger    left third    Past Surgical History:  Procedure Laterality Date  . ANAL FISTULECTOMY  04/17/2011   Procedure: FISTULECTOMY ANAL;  Surgeon: Imogene Burn. [name], MD;  Location: [facility];  Service: General;  Laterality: N/A;  Excision of Condyloma Gluteal Cleft   . BACK SURGERY  04-10-11   x5-Lumbar fusion-retained hardware.(Dr. Louanne Skye)  . BREAST CYST EXCISION     bilateral breast, 3 cysts removed from each breast  . BREAST EXCISIONAL BIOPSY Right    x 3  . BREAST EXCISIONAL BIOPSY Left    x 3  . CARPAL TUNNEL RELEASE Bilateral   . Cervical biospy    . COLONOSCOPY WITH PROPOFOL N/A 07/06/2015   Procedure: COLONOSCOPY WITH PROPOFOL;  Surgeon: [name], MD;  Location: [facility];  Service: Endoscopy;  Laterality: N/A;  . DOPPLER ECHOCARDIOGRAPHY  04/06/2012   AT [place] 55-60%  . I&D EXTREMITY Left 12/18/2016   Procedure: IRRIGATION AND DEBRIDEMENT LEFT FOOT, CLOSURE;  Surgeon: [name], MD;  Location: [place];  Service: Orthopedics;  Laterality: Left;  [name] Kitchen MULTIPLE TOOTH EXTRACTIONS    . NECK SURGERY  04-10-11   x3- cervical fusion with plating and screws-Dr. [name]  . TEE WITHOUT CARDIOVERSION N/A 04/06/2012   Procedure: TRANSESOPHAGEAL ECHOCARDIOGRAM (TEE);  Surgeon: [name], MD;  Location: [facility] ENDOSCOPY;  Service: Cardiovascular;  Laterality: N/A;  . TRIGGER FINGER RELEASE Right  middle finger  . TRIGGER FINGER RELEASE Left 06/28/2016   Procedure: RELEASE TRIGGER FINGER LEFT 3RD FINGER;  Surgeon: [name], MD;  Location: [place];  Service: Orthopedics;  Laterality: Left;  . TRIGGER FINGER RELEASE Right 06/12/2016   Procedure: RIGHT INDEX FINGER TRIGGER RELEASE;  Surgeon: [name], MD;  Location: [place];  Service: Orthopedics;  Laterality: Right;    There were no vitals filed for this visit.   Subjective Assessment - 01/14/17 0803    Subjective  This patient presents following a fall due to hyperglycemic episode.  On 12/12/16 she fell and broke her L foot (5th metatarsal) suffered a R knee strain as well.  She was admitted.   She ultimately had surgery on foot for  skin laceration and infection.  Knee has been hurting since the fall.  She been on the CAM walking boot since surgery and needs to wear 4 more weeks.  She does not have a brace on her Rt. knee.  She has difficulty with mobility in general due to pain in both knee and L foot.  She states that her back pain is chronic, feels she can manage her pain , it has flared up with the fall but is not the priority today.     Pertinent History  Lumbar pain and surgeries x 5 (lumbar fusions) HTN, DM, CHF, morbid obesity, substance abuse, sleep apnea, multiple surgeries on hand, see snapshot for further     Limitations  Lifting;Standing;Walking;House hold activities;Other (comment) sleeping esp in knee     Diagnostic tests  XR "knee sprain", Lumbar XR stable and Lt foot showed fracture 5th met     Patient Stated Goals  She would like to be able to     Currently in Pain?  Yes    Pain Score  10-Worst pain ever    Pain Location  Knee    Pain Orientation  Right;Medial;Anterior    Pain Descriptors / Indicators  Stabbing;Aching;Constant    Pain Type  Acute pain    Pain Onset  More than a month ago    Pain Frequency  Constant    Aggravating Factors   constant 24/7    Pain Relieving Factors  nothing     Effect of Pain on Daily Activities  limtis mobility     Pain Score  8 with boot     Pain Location  Foot pinky toe especially     Pain Orientation  Left    Pain Descriptors / Indicators  Throbbing    Pain Type  Acute pain;Surgical pain    Pain Onset  More than a month ago    Pain Frequency  Constant    Aggravating Factors   constant     Pain Relieving Factors  boot, elevation          [facility] PT Assessment - 01/14/17 0001      Assessment   Referring Provider  Dr. [name]    Prior Therapy  Yes       Precautions   Precautions  None      Restrictions   Weight Bearing Restrictions  No      Balance Screen   Has the patient fallen in the past 6 months  Yes    How many times?  2    Has the patient had  a decrease in activity level because of a fear of falling?   Yes    Is the patient reluctant to leave their home  because of a fear of falling?   No      Home Environment   Living Environment  Private residence    Living Arrangements  Alone    Available Help at Discharge  Personal care attendant    Type of [place] to enter    Entrance Stairs-Number of Steps  [place]  One level    [place] - 4 wheels;Cane - single point      Prior Function   Level of Independence  Independent with community mobility with device;Needs assistance with homemaking    Leisure  watch TV, family, bingo       AROM   Right Knee Extension  8    Right Knee Flexion  115    Left Knee Extension  0    Left Knee Flexion  125    Right Ankle Dorsiflexion  10    Right Ankle Plantar Flexion  40    Right Ankle Inversion  25    Right Ankle Eversion  10    Left Ankle Dorsiflexion  0    Left Ankle Plantar Flexion  26    Left Ankle Inversion  20    Left Ankle Eversion  10      Strength   Right Hip Flexion  4-/5    Left Hip Flexion  4-/5    Right Knee Flexion  5/5    Right Knee Extension  4+/5    Left Knee Flexion  5/5    Left Knee Extension  5/5    Right/Left Ankle  -- pain inhibiiton     Left Ankle Dorsiflexion  4/5    Left Ankle Plantar Flexion  3/5    Left Ankle Inversion  3/5    Left Ankle Eversion  3/5             Objective measurements completed on examination: See above findings.      [facility] Adult PT Treatment/Exercise - 01/14/17 0001      Self-Care   Self-Care  Heat/Ice Application;Other Self-Care Comments    Heat/Ice Application  heat vs ice     Other Self-Care Comments   HEP, ROM      Modalities   Modalities  Electrical Stimulation;Moist Heat      Moist Heat Therapy   Number Minutes Moist Heat  8 Minutes    Moist Heat Location  Knee      Electrical Stimulation   Electrical Stimulation Location  Rt.  knee medial    Electrical Stimulation Action  IFC    Electrical Stimulation Parameters  To tol    Electrical Stimulation Goals  Pain               PT Short Term Goals - 01/14/17 1307      PT SHORT TERM GOAL #1   Title  Pt will be I with HEP for strength and ROM of ankle, knee     Time  3    Period  Weeks    Status  New    Target Date  02/04/17      PT SHORT TERM GOAL #2   Title  Pt will be able to walk with 25% less pain, limping for household mobility.     Time  3    Period  Weeks    Status  New    Target Date  02/04/17  PT Long Term Goals - 01/14/17 1328      PT LONG TERM GOAL #1   Title  Pt will be I with more advanced HEP for LEs     Time  8    Period  Weeks    Status  New    Target Date  03/11/17      PT LONG TERM GOAL #2   Title  Pt will be able to walk without boot, LRAD in the community as needed with pain <4/10 in both knee and foot.     Time  8    Period  Weeks    Status  New    Target Date  03/11/17      PT LONG TERM GOAL #3   Title  Pt will demo L ankle DF anf PF to WNL for proper gait mechanics (push off, toe clearance)    Baseline  Df to neutral and PF to about 26 deg     Time  8    Period  Weeks    Status  New    Target Date  03/11/17      PT LONG TERM GOAL #4   Title  AROM in R knee will equal that of the Lt. knee with minimal to no pain     Baseline  0-8-115  (goal 0-125)    Time  8    Period  Weeks    Status  New    Target Date  03/11/17      PT LONG TERM GOAL #5   Title  Pt will complete IADLs with Independence as previous to injury     Baseline  has CNA for heavier housework     Time  8    Period  Weeks    Status  New    Target Date  03/11/17             Plan - 01/14/17 1311    Clinical Impression Statement  Pt presents for high complexity eval of knee, foot and lumbar spine.  She has severe medial Rt knee pain and L foot pain which interferes with transfers, gait, ability to stand and walk in the community.   She has a CNA a couple of hours per night but that has been since before her fall.  IFC did not improve her pain today.      History and Personal Factors relevant to plan of care:  co -morbidities, obesity, financial, social support    Clinical Presentation  Unstable    Clinical Presentation due to:  poor diabetic control, fall risk, multi-joint     Clinical Decision Making  High    Rehab Potential  Good    PT Frequency  2x / week    PT Duration  8 weeks    PT Treatment/Interventions  ADLs/Self Care Home Management;Electrical Stimulation;Functional mobility training;Taping;Gait training;Moist Heat;Ultrasound;Cryotherapy;Iontophoresis 30m/ml Dexamethasone;DME Instruction;Therapeutic activities;Therapeutic exercise;Balance training;Patient/family education;Manual techniques;Passive range of motion    PT Next Visit Plan  check ROM, check hip strength, try [place] medial knee    PT Home Exercise Plan  level 1 knee anf ankle ROM     Consulted and Agree with Plan of Care  Patient       Patient will benefit from skilled therapeutic intervention in order to improve the following deficits and impairments:  Abnormal gait, Decreased balance, Decreased endurance, Decreased mobility, Decreased skin integrity, Difficulty walking, Hypomobility, Obesity, Improper body mechanics, Pain, Postural dysfunction, Impaired flexibility, Increased fascial restricitons,  Decreased strength, Decreased activity tolerance, Decreased range of motion  Visit Diagnosis: Pain in left ankle and joints of left foot  Acute pain of right knee  Low back pain, unspecified back pain laterality, unspecified chronicity, with sciatica presence unspecified  Muscle weakness (generalized)  Difficulty in walking, not elsewhere classified  Stiffness of left ankle, not elsewhere classified  Abnormal posture  G-Codes - 01-Feb-2017 1323    Functional Assessment Tool Used (Outpatient Only)  clinical judgement     Functional Limitation   Mobility: Walking and moving around    Mobility: Walking and Moving Around Current Status [phone])  At least 60 percent but less than 80 percent impaired, limited or restricted    Mobility: Walking and Moving Around Goal Status [phone])  At least 40 percent but less than 60 percent impaired, limited or restricted        Problem List Patient Active Problem List   Diagnosis Date Noted  . Chronic kidney disease (CKD), stage IV (severe) ([place]) 02-01-17  . Fracture of fifth toe, left, open, initial encounter 12/18/2016  . AKI (acute kidney injury) ([place])   . Type 2 diabetes mellitus with stage 3 chronic kidney disease, with long-term current use of insulin ([place]) 12/12/2016  . Substance abuse ([place]) 11/01/2013  . Chronic diastolic CHF (congestive heart failure) ([place]) 04/07/2012  . GERD 01/26/2010  . MIGRAINE HEADACHE 10/25/2009  . Hypertension 08/04/2008  . OBSTRUCTIVE SLEEP APNEA 02/03/2008  . FIBROCYSTIC BREAST DISEASE 10/28/2006  . Hyperlipidemia 03/27/2006  . Morbid obesity ([place]) 03/27/2006  . TOBACCO DEPENDENCE 03/27/2006  . Depression with anxiety 03/27/2006  . NEUROGENIC BLADDER 03/27/2006    Joseeduardo Brix 02-01-17, 8:24 PM  [place] [facility] [address] [place], [place], [address] Phone: [phone]   Fax:  [phone]  Name: [name] MRN: [id number] Date of Birth: [birth year]    Raeford Razor, PT 02-01-17 8:24 PM Phone: [phone] Fax: [phone]   Raeford Razor, PT 02-01-2017 8:24 PM Phone: [phone] Fax: [phone]

## 2017-01-14 NOTE — Patient Instructions (Addendum)
It was a pleasure seeing you today in our clinic. Today we discussed diabetes. Here is the treatment plan we have discussed and agreed upon together:   A consult was placed to podiatry at today's visit.  You will receive a call to schedule an appointment. If you do not receive a call within two weeks please call our office so we can place the consult again.  Please increase lantus to 20 units each morning.  You can check blood sugars either before or after a meal, just be consistent. If you feel symptomatic, then check your sugar.  Please follow up in one month.    Fasting Blood Sugar Record  Day  Date      Blood      Time:     Sugar  Day  Date      Blood      Time:     Sugar                                                                                                                                                                                        If a reading is high or low, then look back to what the last meal/snack was:  Write down what you last ate, how much, & what time.                               Fasting Blood Sugar Record  Day  Date      Blood      Time:     Sugar  Day  Date      Blood      Time:     Sugar  If a reading is high or low, then look back to what the last meal/snack was:  Write down what you last ate, how much, & what time.

## 2017-01-17 ENCOUNTER — Other Ambulatory Visit: Payer: Self-pay | Admitting: Student in an Organized Health Care Education/Training Program

## 2017-01-17 ENCOUNTER — Other Ambulatory Visit: Payer: Self-pay | Admitting: *Deleted

## 2017-01-17 DIAGNOSIS — E118 Type 2 diabetes mellitus with unspecified complications: Secondary | ICD-10-CM

## 2017-01-17 DIAGNOSIS — N183 Chronic kidney disease, stage 3 (moderate): Secondary | ICD-10-CM

## 2017-01-17 DIAGNOSIS — E1122 Type 2 diabetes mellitus with diabetic chronic kidney disease: Secondary | ICD-10-CM

## 2017-01-17 DIAGNOSIS — Z794 Long term (current) use of insulin: Secondary | ICD-10-CM

## 2017-01-17 MED ORDER — MONTELUKAST SODIUM 10 MG PO TABS: 10.0000 mg | ORAL_TABLET | Freq: Every day | ORAL | 3 refills | Status: DC

## 2017-01-17 MED ORDER — OMEPRAZOLE 20 MG PO CPDR: 20.0000 mg | DELAYED_RELEASE_CAPSULE | Freq: Every day | ORAL | 3 refills | Status: DC

## 2017-01-17 MED ORDER — ACCU-CHEK SOFTCLIX LANCETS MISC: 12 refills | Status: DC

## 2017-01-17 NOTE — Progress Notes (Signed)
Received fax refill request for montelukast, omeprazole, and insulin lancets. [facility]illed [facility].  [name], MD PGY-2 [facility]

## 2017-01-19 MED ORDER — INSULIN PEN NEEDLE 29G X 5MM MISC: 0 refills | Status: DC

## 2017-01-19 MED ORDER — INSULIN LISPRO 100 UNIT/ML (KWIKPEN): 3.0000 [IU] | PEN_INJECTOR | Freq: Three times a day (TID) | SUBCUTANEOUS | 2 refills | Status: DC

## 2017-01-19 MED ORDER — INSULIN GLARGINE 100 UNIT/ML SOLOSTAR PEN: 18.0000 [IU] | PEN_INJECTOR | SUBCUTANEOUS | 99 refills | Status: DC

## 2017-01-22 ENCOUNTER — Ambulatory Visit: Payer: Medicare Other

## 2017-01-23 ENCOUNTER — Ambulatory Visit (INDEPENDENT_AMBULATORY_CARE_PROVIDER_SITE_OTHER): Payer: Medicare Other | Admitting: *Deleted

## 2017-01-23 ENCOUNTER — Encounter: Payer: Self-pay | Admitting: *Deleted

## 2017-01-23 ENCOUNTER — Other Ambulatory Visit: Payer: Self-pay | Admitting: Student in an Organized Health Care Education/Training Program

## 2017-01-23 ENCOUNTER — Telehealth (INDEPENDENT_AMBULATORY_CARE_PROVIDER_SITE_OTHER): Payer: Self-pay | Admitting: Specialist

## 2017-01-23 ENCOUNTER — Other Ambulatory Visit: Payer: Self-pay

## 2017-01-23 ENCOUNTER — Ambulatory Visit (INDEPENDENT_AMBULATORY_CARE_PROVIDER_SITE_OTHER): Payer: Medicare Other | Admitting: Family Medicine

## 2017-01-23 ENCOUNTER — Encounter: Payer: Self-pay | Admitting: Family Medicine

## 2017-01-23 VITALS — BP 122/82 | HR 98 | Temp 98.1°F | Ht 70.0 in | Wt 378.2 lb

## 2017-01-23 DIAGNOSIS — E1122 Type 2 diabetes mellitus with diabetic chronic kidney disease: Secondary | ICD-10-CM | POA: Diagnosis not present

## 2017-01-23 DIAGNOSIS — F17200 Nicotine dependence, unspecified, uncomplicated: Secondary | ICD-10-CM | POA: Diagnosis not present

## 2017-01-23 DIAGNOSIS — N183 Chronic kidney disease, stage 3 unspecified: Secondary | ICD-10-CM

## 2017-01-23 DIAGNOSIS — Z0000 Encounter for general adult medical examination without abnormal findings: Secondary | ICD-10-CM | POA: Diagnosis not present

## 2017-01-23 DIAGNOSIS — Z794 Long term (current) use of insulin: Secondary | ICD-10-CM | POA: Diagnosis not present

## 2017-01-23 DIAGNOSIS — S92502B Displaced unspecified fracture of left lesser toe(s), initial encounter for open fracture: Secondary | ICD-10-CM

## 2017-01-23 NOTE — Telephone Encounter (Signed)
Patient called asking for a refill on Hydrocodone and also wanted to know if she could get a RX for a right knee brace, currently in extreme pain. CB # [phone]

## 2017-01-23 NOTE — Telephone Encounter (Signed)
Patient was being seen in office and stated she needs refill on RX Hydrocodone. Please let patient know if this can be done. [phone]

## 2017-01-23 NOTE — Progress Notes (Signed)
Subjective:   [name] is a 51 y.o. female who presents for an Initial Medicare Annual Wellness Visit.  Cardiac Risk Factors include: diabetes mellitus;dyslipidemia;hypertension;obesity (BMI >30kg/m2);smoking/ tobacco exposure;sedentary lifestyle     Objective:    Today's Vitals   01/23/17 1020  PainSc: 10-Worst pain ever   BP 122/82 P 98 SpO2 100% WT 378 lbs 3 oz (with ortho boot on left) HT 5'10 BMI 54.27  Advanced Directives 01/23/2017 01/14/2017 12/27/2016 12/17/2016 12/16/2016 12/13/2016 12/12/2016  Does Patient Have a Medical Advance Directive? No No No - No No No  Would patient like information on creating a medical advance directive? Yes (MAU/Ambulatory/Procedural Areas - Information given) No - Patient declined No - Patient declined No - Patient declined - No - Patient declined No - Patient declined  Pre-existing out of facility DNR order (yellow form or pink MOST form) - - - - - - -    Current Medications (verified) Outpatient Encounter Medications as of 01/23/2017  Medication Sig  . ACCU-CHEK SOFTCLIX LANCETS lancets Use as instructed  . amLODipine (NORVASC) 10 MG tablet TAKE 1 TABLET ONCE DAILY FOR BLOOD PRESSURE. (Patient taking differently: 10 mg daily. FOR BLOOD PRESSURE)  . atorvastatin (LIPITOR) 40 MG tablet Take 1 tablet (40 mg total) daily by mouth.  . blood glucose meter kit and supplies KIT Dispense based on patient and insurance preference. Use up to four times daily as directed. (FOR ICD-9 250.00, 250.01).  . busPIRone (BUSPAR) 10 MG tablet Take 1 tablet (10 mg total) by mouth 3 (three) times daily.  . citalopram (CELEXA) 20 MG tablet Take 1 tablet (20 mg total) daily by mouth.  . fluticasone (FLONASE) 50 MCG/ACT nasal spray Place 2 sprays into both nostrils 2 (two) times daily.  [name] Kitchen gabapentin (NEURONTIN) 300 MG capsule TAKE (1) CAPSULE THREE TIMES DAILY. (Patient taking differently: Take 300 mg 3 (three) times daily by mouth. )  . glucose blood  (ACCU-CHEK AVIVA) test strip Use as instructed  . hydrochlorothiazide (HYDRODIURIL) 25 MG tablet TAKE 1 TABLET EACH DAY. (Patient taking differently: Take 25 mg daily by mouth. )  . HYDROcodone-acetaminophen (NORCO) 7.5-325 MG tablet Take 1-2 tablets by mouth every 4 (four) hours as needed for moderate pain.  . Insulin Glargine (LANTUS SOLOSTAR) 100 UNIT/ML Solostar Pen Inject 18 Units into the skin every morning. If your fasting blood sugar in the morning is greater than 300, increase your lantus dose by ONE unit.  [name] Kitchen insulin lispro (HUMALOG KWIKPEN) 100 UNIT/ML KiwkPen Inject 0.03 mLs (3 Units total) into the skin 3 (three) times daily before meals.  . Insulin Pen Needle 29G X 5MM MISC Check sugars TID before meals.  [name] Kitchen loratadine (CLARITIN) 10 MG tablet Take 10 mg by mouth daily.  [name] Kitchen losartan (COZAAR) 100 MG tablet Take 1 tablet (100 mg total) daily by mouth.  . montelukast (SINGULAIR) 10 MG tablet Take 1 tablet (10 mg total) by mouth at bedtime. AS DIRECTED  . mupirocin ointment (BACTROBAN) 2 % Apply 1 application topically 2 (two) times daily.  . NON FORMULARY Uses a C-PAP at bedtime  . nystatin (MYCOSTATIN) 100000 UNIT/ML suspension Take 15 mLs 2 (two) times daily by mouth. SWISH FOR 30 SECONDS AND SPIT  . omeprazole (PRILOSEC) 20 MG capsule Take 1 capsule (20 mg total) by mouth daily.  [name] Kitchen SPIRIVA HANDIHALER 18 MCG inhalation capsule INHALE CONTENTS OF 1 CAPSULE DAILY.  . VENTOLIN HFA 108 (90 Base) MCG/ACT inhaler USE 1-2 PUFFS INTO LUNGS EVERY 4 HOURS  AS NEEDED FOR WHEEZING OR SHORTNESS OF BREATH.   No facility-administered encounter medications on file as of 01/23/2017.     Allergies (verified) Methadone hcl; Levofloxacin; and Nicotine   History: Past Medical History:  Diagnosis Date  . Arthritis 04-10-11   hips, shoulders, back  . Asthma   . Bipolar disorder ([place])   . Cancer ([name]) 1993   cervical, no treatment done, went away per pt  . Cervical dysplasia or atypia 04-10-11   '93-  once dx.-got pregnant-no intervention, then postpartum, no dysplasia found  . CHF (congestive heart failure) ([place])    no cardiologist 2014 dx, none now  . 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 Pathology showed Condyloma, gluteal CONDYLOMA ACUMINATUM.   [name] Kitchen COPD (chronic obstructive pulmonary disease) ([place])   . Depression   . Diabetes mellitus without complication ([place])   . Dyspnea    with actity - better since taking Singular  . Fall 12/16/2016  . Fibrocystic breast disease   . GERD (gastroesophageal reflux disease)   . Hypercalcemia   . Hyperlipidemia   . Hypertension   . Migraine headache    none recent  . Morbid obesity ([place]) 03/27/2006  . Neuropathy   . Skin lesion 03/15/2011   In gluteal crease now s/p removal by Dr. [name] of General Surgery on 3/12. Path shows condyloma.     . Sleep apnea 04-10-11   uses cpap, pt does not know settings  . Trigger finger    left third   Past Surgical History:  Procedure Laterality Date  . ANAL FISTULECTOMY  04/17/2011   Procedure: FISTULECTOMY ANAL;  Surgeon: Imogene Burn. [name], MD;  Location: [facility];  Service: General;  Laterality: N/A;  Excision of Condyloma Gluteal Cleft   . BACK SURGERY  04-10-11   x5-Lumbar fusion-retained hardware.(Dr. Louanne Skye)  . BREAST CYST EXCISION     bilateral breast, 3 cysts removed from each breast  . BREAST EXCISIONAL BIOPSY Right    x 3  . BREAST EXCISIONAL BIOPSY Left    x 3  . CARPAL TUNNEL RELEASE Bilateral   . Cervical biospy    . COLONOSCOPY WITH PROPOFOL N/A 07/06/2015   Procedure: COLONOSCOPY WITH PROPOFOL;  Surgeon: [name], MD;  Location: [facility];  Service: Endoscopy;  Laterality: N/A;  . DOPPLER ECHOCARDIOGRAPHY  04/06/2012   AT [place] 55-60%  . I&D EXTREMITY Left 12/18/2016   Procedure: IRRIGATION AND DEBRIDEMENT LEFT FOOT, CLOSURE;  Surgeon: [name], MD;  Location: [facility];  Service: Orthopedics;  Laterality: Left;  [name] Kitchen MULTIPLE TOOTH EXTRACTIONS    .  NECK SURGERY  04-10-11   x3- cervical fusion with plating and screws-Dr. [name]  . TEE WITHOUT CARDIOVERSION N/A 04/06/2012   Procedure: TRANSESOPHAGEAL ECHOCARDIOGRAM (TEE);  Surgeon: [name], MD;  [facility];  Service: Cardiovascular;  Laterality: N/A;  . TRIGGER FINGER RELEASE Right    middle finger  . TRIGGER FINGER RELEASE Left 06/28/2016   Procedure: RELEASE TRIGGER FINGER LEFT 3RD FINGER;  Surgeon: [name], MD;  Location: [facility];  Service: Orthopedics;  Laterality: Left;  . TRIGGER FINGER RELEASE Right 06/12/2016   Procedure: RIGHT INDEX FINGER TRIGGER RELEASE;  Surgeon: [name], MD;  Location: [place] [facility];  Service: Orthopedics;  Laterality: Right;   Family History  Problem Relation Age of Onset  . Stroke Father   . Cancer Maternal Aunt        breast  . Asthma Other   .  COPD Other   . Hypertension Other   . Diabetes Other    Social History   Socioeconomic History  . Marital status: Divorced    Spouse name: None  . Number of children: None  . Years of education: None  . Highest education level: None  Social Needs  . Financial resource strain: None  . Food insecurity - worry: Never true  . Food insecurity - inability: Often true  . Transportation needs - medical: None  . Transportation needs - non-medical: None  Occupational History  . None  Tobacco Use  . Smoking status: Current Every Day Smoker    Packs/day: 0.50    Years: 39.00    Pack years: 19.50    Types: Cigarettes    Start date: 01/28/1977  . Smokeless tobacco: Never Used  . Tobacco comment: Previous 1 ppd,  Quit during hospit. 12/16/2016  Substance and Sexual Activity  . Alcohol use: No    Alcohol/week: 0.0 oz  . Drug use: Yes    Types: Marijuana, "Crack" cocaine    Comment: hx marijuana usemany years ago, Crack -none since 11/2015  . Sexual activity: Yes    Partners: Male  Other Topics Concern  . None  Social History Narrative  . None    Tobacco Counseling Ready to quit:  Yes Counseling given: Yes Comment: Previous 1 ppd,  Quit during hospit. 12/16/2016 Information provided on 1-800-QUIT-NOW and referral form faxed to [facility] at [phone]  Clinical Intake:  Pre-visit preparation completed: Yes  Pain : 0-10 Pain Score: 10-Worst pain ever Pain Type: Chronic pain Pain Location: Back Pain Orientation: Lower Pain Descriptors / Indicators: Sharp, Aching, Burning, Throbbing Pain Onset: More than a month ago Pain Relieving Factors: No Effect of Pain on Daily Activities: Yes  Pain Relieving Factors: No  Nutritional Status: BMI > 30  Obese Diabetes: Yes CBG done?: No Did pt. bring in CBG monitor from home?: No   Interpreter Needed?: No   Activities of Daily Living In your present state of health, do you have any difficulty performing the following activities: 01/23/2017 12/17/2016  Hearing? N -  Vision? Y -  Difficulty concentrating or making decisions? N -  Walking or climbing stairs? Y -  Dressing or bathing? Y -  Doing errands, shopping? N Y  Conservation officer, nature and eating ? N -  Using the Toilet? N -  In the past six months, have you accidently leaked urine? Y -  Do you have problems with loss of bowel control? N -  Managing your Medications? N -  Managing your Finances? N -  Housekeeping or managing your Housekeeping? Y -  Some recent data might be hidden   Home Safety:  My home has a working smoke alarm:  Yes X 6           My home throw rugs have been fastened down to the floor or removed:  Wall to wall carpeting only I have a non-slip surface or non-slip mats in the bathtub and shower:  Need to purchase new mat. Has grab bars.        All my home's stairs have handrails, including any outdoor stairs  One level home with 4 outside steps with handrails          My home's floors, stairs and hallways are free from clutter, wires and cords:  Yes     I have animals in my home  No I wear seatbelts consistently:  Yes    Immunizations and  Health Maintenance Immunization History  Administered Date(s) Administered  . Influenza Split 04/05/2012  . Influenza Whole 02/24/2007, 10/23/2007, 12/06/2008, 01/16/2010  . Influenza,inj,Quad PF,6+ Mos 11/29/2013, 09/29/2014, 11/23/2015, 11/22/2016  . Pneumococcal Conjugate-13 12/27/2016  . Pneumococcal Polysaccharide-23 11/28/2001  . Td 05/28/2004   Health Maintenance Due  Topic Date Due  . FOOT EXAM  07/25/1973  . OPHTHALMOLOGY EXAM  07/25/1973  . PNEUMOCOCCAL POLYSACCHARIDE VACCINE (2) 11/29/2006   Has had numerous foot exams following fall on 12/16/2016 (Broken toes on left foot and sprained right foot) Has eye appt with Dr. Gershon Crane on 02/13/2017 Pneumovax due 12/27/2017 one year following Prevnar  Patient Care Team: [name], MD as PCP - General Dr. Gershon Crane for Ophthalmology Dr. Baird Cancer for Dentistry  Indicate any recent Medical Services you may have received from other than [facility] providers in the past year (date may be approximate).     Assessment:   This is a routine wellness examination for [name].  Hearing/Vision screen  Hearing Screening   Method: Audiometry   '125Hz'  '250Hz'  '500Hz'  '1000Hz'  '2000Hz'  '3000Hz'  '4000Hz'  '6000Hz'  '8000Hz'   Right ear:   '25 25 25  25    ' Left ear:   '25 25 25  25      ' Dietary issues and exercise activities discussed: Exercise limited by: orthopedic condition(s);Other - see comments(Patient unable to exercise at present 2/2 broken toes on left and sprain on right foot following fall 12/16/2016)  Goals    . Get back to swimming daily at [facility] once foot is healed    . Quit Smoking    . Weight (lb) < 346 lb (156.9 kg) (pt-stated)     7% weight loss      Discussed recording consumption intake to assist with desired 7% weight loss. Recommended MyPLate or My Fitness Pal apps to assist with recording.  Depression Screen PHQ 2/9 Scores 01/23/2017 01/14/2017 01/13/2017 12/27/2016 12/11/2016 12/05/2016 11/22/2016  PHQ - 2 Score 0 0 0 0 0 0 0  PHQ- 9  Score - - - - - - -    Fall Risk Fall Risk  01/23/2017 01/14/2017 01/13/2017 12/27/2016 12/11/2016  Falls in the past year? Yes No Yes Yes Yes  Number falls in past yr: 2 or more - 2 or more 1 1  Injury with Fall? Yes - Yes Yes No  Comment Broke toe on left foot and sprained right foot - - broke toes and busted up foot -  Risk Factor Category  High Fall Risk - - - -  Comment PCP notified - - - -  Risk for fall due to : History of fall(s);Impaired balance/gait;Impaired mobility - Other (Comment) - -  Follow up Falls evaluation completed;Education provided;Falls prevention discussed - - - -   States right knee gave out prior to fall 09/28/2016 and 12/16/2016  TUG Test:  Done in 17 seconds. Patient used both hands to push out of chair and one hand to sit back down. Falls prevention discussed in detail and literature given. PCP made aware of High Fall Risk status.  Cognitive Function: Mini-Cog  Passed with score 4/5  Screening Tests Health Maintenance  Topic Date Due  . FOOT EXAM  07/25/1973  . OPHTHALMOLOGY EXAM  07/25/1973  . PNEUMOCOCCAL POLYSACCHARIDE VACCINE (2) 11/29/2006  . TETANUS/TDAP  04/28/2018 (Originally 05/29/2014)  . HEMOGLOBIN A1C  06/04/2017  . MAMMOGRAM  11/16/2018  . PAP SMEAR  11/23/2018  . COLONOSCOPY  07/05/2025  . INFLUENZA VACCINE  Completed  . Hepatitis C Screening  Completed  .  HIV Screening  Completed    Qualifies for Shingles Vaccine? Yes, discussed Shingrix vaccine  Breast: Up to date on Mammogram? Yes      Plan:    Has eye appt with Dr. Gershon Crane on 02/13/2017 Pneumovax due 12/27/2017 one year following Prevnar   I have personally reviewed and noted the following in the patient's chart:   . Medical and social history . Use of alcohol, tobacco or illicit drugs  . Current medications and supplements . Functional ability and status . Nutritional status . Physical activity . Advanced directives . List of other physicians . Hospitalizations,  surgeries, and ER visits in previous 12 months . Vitals . Screenings to include cognitive, depression, and falls . Referrals and appointments  In addition, I have reviewed and discussed with patient certain preventive protocols, quality metrics, and best practice recommendations. A written personalized care plan for preventive services as well as general preventive health recommendations were provided to patient.     Velora Heckler, RN   01/23/2017

## 2017-01-23 NOTE — Telephone Encounter (Signed)
Also routing to doctor who saw pt today. Katharina Caper, Makya Phillis D, [place]

## 2017-01-23 NOTE — Assessment & Plan Note (Signed)
Healing well per patient's ambulation and notes from ortho.  Was requesting more narcotic pain meds but due to multiple prescribers in last few months all narcotics through this clinic are to be prescribed only by pcp, Dr. Burr Medico.   Staff message sent and patient instructed that she will need to see Dr. Burr Medico for narcotics

## 2017-01-23 NOTE — Patient Instructions (Signed)
It was a pleasure to see you today! Thank you for choosing [facility] for your primary care. [name] was seen for diabetes followup. Come back to the clinic if you have any concerns, and go to the emergency room if you have any life threatening symptoms.  You've been doing well.  We'd like you to focus on diet control and going to your diabetic foot/eye exams.  We are not changing your insulin at this time.  Please contact your pcp, Dr. Burr Medico for any narcotic pain medicine requests.  If we did any lab work today, and the results require attention, either me or my nurse will get in touch with you. If everything is normal, you will get a letter in mail and a message via . If you don't hear from [place] in two weeks, please give [place] a call. Otherwise, we look forward to seeing you again at your next visit. If you have any questions or concerns before then, please call the clinic at [phone].  Please bring all your medications to every doctors visit  Sign up for My Chart to have easy access to your labs results, and communication with your Primary care physician.    Please check-out at the front desk before leaving the clinic.    Best,  Dr. Sherene Sires FAMILY MEDICINE RESIDENT - PGY1 01/23/2017 9:08 AM

## 2017-01-23 NOTE — Telephone Encounter (Signed)
Please advise 

## 2017-01-23 NOTE — Telephone Encounter (Addendum)
Per chart review, hydrocodone was for postop for her foot fractures. From the last Ortho note she is healing well. Further refills would not be appropriate at this time. Furthermore per [place] PMP she just received a 30d supply on 01/10/17.

## 2017-01-23 NOTE — Progress Notes (Signed)
    Subjective:  [name] is a 51 y.o. female who presents to the [facility] today with a chief complaint of diabetic management and check in.   Patient says she is still trying to dial in her diet but she has been very consistent in taking her meds and tracking her cbgs.  She presents a log with consistent blood sugars between 100s and 200 with only a few above 200 while on 22lantus in AM and 3 shortacting with meals.     She also wants to talk about more narcotic pain medication with her foot that she fractured last month.   She is being followed by ortho and they have her on ibuprofen/tylenol at this point and she says "it doesn't touch the pain".  She is able to ambulate but finds it uncomfortable after a long distance.  She mentions she started smoking again but does not want nicotene patch, she would like to try and stop on her own as she knows it is not healthy for her.  Objective:  Physical Exam: BP 122/82 (BP Location: Left Arm, Patient Position: Sitting, Cuff Size: Large)   Pulse 98   Temp 98.1 F (36.7 C) (Oral)   Ht 5\' 10"  (1.778 m)   Wt (!) 378 lb 3.2 oz (171.6 kg)   LMP 04/10/2006   SpO2 100%   BMI 54.27 kg/m   Gen: NAD, resting comfortably CV: RRR with no murmurs appreciated Pulm: NWOB, CTAB with no crackles, wheezes, or rhonchi GI: Soft, Nontender, Nondistended. MSK: no edema, cyanosis, or clubbing noted.  Perfusion/sensation intact to toes Skin: warm, dry Neuro: grossly normal, moves all extremities Psych: Normal affect and thought content  No results found for this or any previous visit (from the past 72 hour(s)).   Assessment/Plan:  Type 2 diabetes mellitus with stage 3 chronic kidney disease, with long-term current use of insulin (HCC) CBGs per home check 105-low 200s with most in mid 100s.  We are not adjusting her insulin regimen for concern of hypoglycemia and are encouraging stricter diet control.   She has a diabetic eye exam next month and we are  ordering a diabetic foot exam for her.  Fracture of fifth toe, left, open, initial encounter Healing well per patient's ambulation and notes from ortho.  Was requesting more narcotic pain meds but due to multiple prescribers in last few months all narcotics through this clinic are to be prescribed only by pcp, Dr. Burr Medico.   Staff message sent and patient instructed that she will need to see Dr. Burr Medico for narcotics  TOBACCO DEPENDENCE Started smoking again after foot fracture, declines patch and would like to stop on her own  Medicare does not pay for TDAP so that is not being given today for financial reasons  Sherene Sires, [place] - PGY1 01/23/2017 9:21 AM

## 2017-01-23 NOTE — Assessment & Plan Note (Signed)
CBGs per home check 105-low 200s with most in mid 100s.  We are not adjusting her insulin regimen for concern of hypoglycemia and are encouraging stricter diet control.   She has a diabetic eye exam next month and we are ordering a diabetic foot exam for her.

## 2017-01-23 NOTE — Patient Instructions (Addendum)
[name],  Thank you for taking time to come for yourMedicare Wellness Visit. I appreciate your ongoing commitment to your health goals. Please review the following plan we discussed and let me know if I can assist you in the future.   These are the goals we discussed:  Goals    . Get back to swimming daily at [facility] once foot is healed    . Quit Smoking    . Weight (lb) < 346 lb (156.9 kg) (pt-stated)     7% weight loss        Diabetes and Foot Care Diabetes may cause you to have problems because of poor blood supply (circulation) to your feet and legs. This may cause the skin on your feet to become thinner, break easier, and heal more slowly. Your skin may become dry, and the skin may peel and crack. You may also have nerve damage in your legs and feet causing decreased feeling in them. You may not notice minor injuries to your feet that could lead to infections or more serious problems. Taking care of your feet is one of the most important things you can do for yourself. Follow these instructions at home:  Wear shoes at all times, even in the house. Do not go barefoot. Bare feet are easily injured.  Check your feet daily for blisters, cuts, and redness. If you cannot see the bottom of your feet, use a mirror or ask someone for help.  Wash your feet with warm water (do not use hot water) and mild soap. Then pat your feet and the areas between your toes until they are completely dry. Do not soak your feet as this can dry your skin.  Apply a moisturizing lotion or petroleum jelly (that does not contain alcohol and is unscented) to the skin on your feet and to dry, brittle toenails. Do not apply lotion between your toes.  Trim your toenails straight across. Do not dig under them or around the cuticle. File the edges of your nails with an emery board or nail file.  Do not cut corns or calluses or try to remove them with medicine.  Wear clean socks or stockings every day. Make sure  they are not too tight. Do not wear knee-high stockings since they may decrease blood flow to your legs.  Wear shoes that fit properly and have enough cushioning. To break in new shoes, wear them for just a few hours a day. This prevents you from injuring your feet. Always look in your shoes before you put them on to be sure there are no objects inside.  Do not cross your legs. This may decrease the blood flow to your feet.  If you find a minor scrape, cut, or break in the skin on your feet, keep it and the skin around it clean and dry. These areas may be cleansed with mild soap and water. Do not cleanse the area with peroxide, alcohol, or iodine.  When you remove an adhesive bandage, be sure not to damage the skin around it.  If you have a wound, look at it several times a day to make sure it is healing.  Do not use heating pads or hot water bottles. They may burn your skin. If you have lost feeling in your feet or legs, you may not know it is happening until it is too late.  Make sure your health care provider performs a complete foot exam at least annually or more often if  you have foot problems. Report any cuts, sores, or bruises to your health care provider immediately. Contact a health care provider if:  You have an injury that is not healing.  You have cuts or breaks in the skin.  You have an ingrown nail.  You notice redness on your legs or feet.  You feel burning or tingling in your legs or feet.  You have pain or cramps in your legs and feet.  Your legs or feet are numb.  Your feet always feel cold. Get help right away if:  There is increasing redness, swelling, or pain in or around a wound.  There is a red line that goes up your leg.  Pus is coming from a wound.  You develop a fever or as directed by your health care provider.  You notice a bad smell coming from an ulcer or wound. This information is not intended to replace advice given to you by your health care  provider. Make sure you discuss any questions you have with your health care provider. Document Released: 01/12/2000 Document Revised: 06/22/2015 Document Reviewed: 06/23/2012 Elsevier Interactive Patient Education  2017 [place] Prevention in the Home Falls can cause injuries. They can happen to people of all ages. There are many things you can do to make your home safe and to help prevent falls. What can I do on the outside of my home?  Regularly fix the edges of walkways and driveways and fix any cracks.  Remove anything that might make you trip as you walk through a door, such as a raised step or threshold.  Trim any bushes or trees on the path to your home.  Use bright outdoor lighting.  Clear any walking paths of anything that might make someone trip, such as rocks or tools.  Regularly check to see if handrails are loose or broken. Make sure that both sides of any steps have handrails.  Any raised decks and porches should have guardrails on the edges.  Have any leaves, snow, or ice cleared regularly.  Use sand or salt on walking paths during winter.  Clean up any spills in your garage right away. This includes oil or grease spills. What can I do in the bathroom?  Use night lights.  Install grab bars by the toilet and in the tub and shower. Do not use towel bars as grab bars.  Use non-skid mats or decals in the tub or shower.  If you need to sit down in the shower, use a plastic, non-slip stool.  Keep the floor dry. Clean up any water that spills on the floor as soon as it happens.  Remove soap buildup in the tub or shower regularly.  Attach bath mats securely with double-sided non-slip rug tape.  Do not have throw rugs and other things on the floor that can make you trip. What can I do in the bedroom?  Use night lights.  Make sure that you have a light by your bed that is easy to reach.  Do not use any sheets or blankets that are too big for your  bed. They should not hang down onto the floor.  Have a firm chair that has side arms. You can use this for support while you get dressed.  Do not have throw rugs and other things on the floor that can make you trip. What can I do in the kitchen?  Clean up any spills right away.  Avoid walking on wet  floors.  Keep items that you use a lot in easy-to-reach places.  If you need to reach something above you, use a strong step stool that has a grab bar.  Keep electrical cords out of the way.  Do not use floor polish or wax that makes floors slippery. If you must use wax, use non-skid floor wax.  Do not have throw rugs and other things on the floor that can make you trip. What can I do with my stairs?  Do not leave any items on the stairs.  Make sure that there are handrails on both sides of the stairs and use them. Fix handrails that are broken or loose. Make sure that handrails are as long as the stairways.  Check any carpeting to make sure that it is firmly attached to the stairs. Fix any carpet that is loose or worn.  Avoid having throw rugs at the top or bottom of the stairs. If you do have throw rugs, attach them to the floor with carpet tape.  Make sure that you have a light switch at the top of the stairs and the bottom of the stairs. If you do not have them, ask someone to add them for you. What else can I do to help prevent falls?  Wear shoes that: ? Do not have high heels. ? Have rubber bottoms. ? Are comfortable and fit you well. ? Are closed at the toe. Do not wear sandals.  If you use a stepladder: ? Make sure that it is fully opened. Do not climb a closed stepladder. ? Make sure that both sides of the stepladder are locked into place. ? Ask someone to hold it for you, if possible.  Clearly mark and make sure that you can see: ? Any grab bars or handrails. ? First and last steps. ? Where the edge of each step is.  Use tools that help you move around (mobility  aids) if they are needed. These include: ? Canes. ? Walkers. ? Scooters. ? Crutches.  Turn on the lights when you go into a dark area. Replace any light bulbs as soon as they burn out.  Set up your furniture so you have a clear path. Avoid moving your furniture around.  If any of your floors are uneven, fix them.  If there are any pets around you, be aware of where they are.  Review your medicines with your doctor. Some medicines can make you feel dizzy. This can increase your chance of falling. Ask your doctor what other things that you can do to help prevent falls. This information is not intended to replace advice given to you by your health care provider. Make sure you discuss any questions you have with your health care provider. Document Released: 11/10/2008 Document Revised: 06/22/2015 Document Reviewed: 02/18/2014 Elsevier Interactive Patient Education  2018 [place] Maintenance, Female Adopting a healthy lifestyle and getting preventive care can go a long way to promote health and wellness. Talk with your health care provider about what schedule of regular examinations is right for you. This is a good chance for you to check in with your provider about disease prevention and staying healthy. In between checkups, there are plenty of things you can do on your own. Experts have done a lot of research about which lifestyle changes and preventive measures are most likely to keep you healthy. Ask your health care provider for more information. Weight and diet Eat a healthy diet  Be sure  to include plenty of vegetables, fruits, low-fat dairy products, and lean protein.  Do not eat a lot of foods high in solid fats, added sugars, or salt.  Get regular exercise. This is one of the most important things you can do for your health. ? Most adults should exercise for at least 150 minutes each week. The exercise should increase your heart rate and make you sweat  (moderate-intensity exercise). ? Most adults should also do strengthening exercises at least twice a week. This is in addition to the moderate-intensity exercise.  Maintain a healthy weight  Body mass index (BMI) is a measurement that can be used to identify possible weight problems. It estimates body fat based on height and weight. Your health care provider can help determine your BMI and help you achieve or maintain a healthy weight.  For females 59 years of age and older: ? A BMI below 18.5 is considered underweight. ? A BMI of 18.5 to 24.9 is normal. ? A BMI of 25 to 29.9 is considered overweight. ? A BMI of 30 and above is considered obese.  Watch levels of cholesterol and blood lipids  You should start having your blood tested for lipids and cholesterol at 53 years of age, then have this test every 5 years.  You may need to have your cholesterol levels checked more often if: ? Your lipid or cholesterol levels are high. ? You are older than 53 years of age. ? You are at high risk for heart disease.  Cancer screening Lung Cancer  Lung cancer screening is recommended for adults 61-20 years old who are at high risk for lung cancer because of a history of smoking.  A yearly low-dose CT scan of the lungs is recommended for people who: ? Currently smoke. ? Have quit within the past 15 years. ? Have at least a 30-pack-year history of smoking. A pack year is smoking an average of one pack of cigarettes a day for 1 year.  Yearly screening should continue until it has been 15 years since you quit.  Yearly screening should stop if you develop a health problem that would prevent you from having lung cancer treatment.  Breast Cancer  Practice breast self-awareness. This means understanding how your breasts normally appear and feel.  It also means doing regular breast self-exams. Let your health care provider know about any changes, no matter how small.  If you are in your 20s or  30s, you should have a clinical breast exam (CBE) by a health care provider every 1-3 years as part of a regular health exam.  If you are 44 or older, have a CBE every year. Also consider having a breast X-ray (mammogram) every year.  If you have a family history of breast cancer, talk to your health care provider about genetic screening.  If you are at high risk for breast cancer, talk to your health care provider about having an MRI and a mammogram every year.  Breast cancer gene (BRCA) assessment is recommended for women who have family members with BRCA-related cancers. BRCA-related cancers include: ? Breast. ? Ovarian. ? Tubal. ? Peritoneal cancers.  Results of the assessment will determine the need for genetic counseling and BRCA1 and BRCA2 testing.  Cervical Cancer Your health care provider may recommend that you be screened regularly for cancer of the pelvic organs (ovaries, uterus, and vagina). This screening involves a pelvic examination, including checking for microscopic changes to the surface of your cervix (Pap test). You  may be encouraged to have this screening done every 3 years, beginning at age 71.  For women ages 55-65, health care providers may recommend pelvic exams and Pap testing every 3 years, or they may recommend the Pap and pelvic exam, combined with testing for human papilloma virus (HPV), every 5 years. Some types of HPV increase your risk of cervical cancer. Testing for HPV may also be done on women of any age with unclear Pap test results.  Other health care providers may not recommend any screening for nonpregnant women who are considered low risk for pelvic cancer and who do not have symptoms. Ask your health care provider if a screening pelvic exam is right for you.  If you have had past treatment for cervical cancer or a condition that could lead to cancer, you need Pap tests and screening for cancer for at least 20 years after your treatment. If Pap tests  have been discontinued, your risk factors (such as having a new sexual partner) need to be reassessed to determine if screening should resume. Some women have medical problems that increase the chance of getting cervical cancer. In these cases, your health care provider may recommend more frequent screening and Pap tests.  Colorectal Cancer  This type of cancer can be detected and often prevented.  Routine colorectal cancer screening usually begins at 53 years of age and continues through 53 years of age.  Your health care provider may recommend screening at an earlier age if you have risk factors for colon cancer.  Your health care provider may also recommend using home test kits to check for hidden blood in the stool.  A small camera at the end of a tube can be used to examine your colon directly (sigmoidoscopy or colonoscopy). This is done to check for the earliest forms of colorectal cancer.  Routine screening usually begins at age 29.  Direct examination of the colon should be repeated every 5-10 years through 53 years of age. However, you may need to be screened more often if early forms of precancerous polyps or small growths are found.  Skin Cancer  Check your skin from head to toe regularly.  Tell your health care provider about any new moles or changes in moles, especially if there is a change in a mole's shape or color.  Also tell your health care provider if you have a mole that is larger than the size of a pencil eraser.  Always use sunscreen. Apply sunscreen liberally and repeatedly throughout the day.  Protect yourself by wearing long sleeves, pants, a wide-brimmed hat, and sunglasses whenever you are outside.  Heart disease, diabetes, and high blood pressure  High blood pressure causes heart disease and increases the risk of stroke. High blood pressure is more likely to develop in: ? People who have blood pressure in the high end of the normal range (130-139/85-89 mm  Hg). ? People who are overweight or obese. ? People who are African American.  If you are 79-2 years of age, have your blood pressure checked every 3-5 years. If you are 60 years of age or older, have your blood pressure checked every year. You should have your blood pressure measured twice-once when you are at a hospital or clinic, and once when you are not at a hospital or clinic. Record the average of the two measurements. To check your blood pressure when you are not at a hospital or clinic, you can use: ? An automated blood pressure machine  at a pharmacy. ? A home blood pressure monitor.  If you are between 8 years and 86 years old, ask your health care provider if you should take aspirin to prevent strokes.  Have regular diabetes screenings. This involves taking a blood sample to check your fasting blood sugar level. ? If you are at a normal weight and have a low risk for diabetes, have this test once every three years after 53 years of age. ? If you are overweight and have a high risk for diabetes, consider being tested at a younger age or more often. Preventing infection Hepatitis B  If you have a higher risk for hepatitis B, you should be screened for this virus. You are considered at high risk for hepatitis B if: ? You were born in a country where hepatitis B is common. Ask your health care provider which countries are considered high risk. ? Your parents were born in a high-risk country, and you have not been immunized against hepatitis B (hepatitis B vaccine). ? You have HIV or AIDS. ? You use needles to inject street drugs. ? You live with someone who has hepatitis B. ? You have had sex with someone who has hepatitis B. ? You get hemodialysis treatment. ? You take certain medicines for conditions, including cancer, organ transplantation, and autoimmune conditions.  Hepatitis C  Blood testing is recommended for: ? Everyone born from [birth year] through 1967. ? Anyone with known  risk factors for hepatitis C.  Sexually transmitted infections (STIs)  You should be screened for sexually transmitted infections (STIs) including gonorrhea and chlamydia if: ? You are sexually active and are younger than 53 years of age. ? You are older than 53 years of age and your health care provider tells you that you are at risk for this type of infection. ? Your sexual activity has changed since you were last screened and you are at an increased risk for chlamydia or gonorrhea. Ask your health care provider if you are at risk.  If you do not have HIV, but are at risk, it may be recommended that you take a prescription medicine daily to prevent HIV infection. This is called pre-exposure prophylaxis (PrEP). You are considered at risk if: ? You are sexually active and do not regularly use condoms or know the HIV status of your partner(s). ? You take drugs by injection. ? You are sexually active with a partner who has HIV.  Talk with your health care provider about whether you are at high risk of being infected with HIV. If you choose to begin PrEP, you should first be tested for HIV. You should then be tested every 3 months for as long as you are taking PrEP. Pregnancy  If you are premenopausal and you may become pregnant, ask your health care provider about preconception counseling.  If you may become pregnant, take 400 to 800 micrograms (mcg) of folic acid every day.  If you want to prevent pregnancy, talk to your health care provider about birth control (contraception). Osteoporosis and menopause  Osteoporosis is a disease in which the bones lose minerals and strength with aging. This can result in serious bone fractures. Your risk for osteoporosis can be identified using a bone density scan.  If you are 72 years of age or older, or if you are at risk for osteoporosis and fractures, ask your health care provider if you should be screened.  Ask your health care provider whether you  should take a calcium  or vitamin D supplement to lower your risk for osteoporosis.  Menopause may have certain physical symptoms and risks.  Hormone replacement therapy may reduce some of these symptoms and risks. Talk to your health care provider about whether hormone replacement therapy is right for you. Follow these instructions at home:  Schedule regular health, dental, and eye exams.  Stay current with your immunizations.  Do not use any tobacco products including cigarettes, chewing tobacco, or electronic cigarettes.  If you are pregnant, do not drink alcohol.  If you are breastfeeding, limit how much and how often you drink alcohol.  Limit alcohol intake to no more than 1 drink per day for nonpregnant women. One drink equals 12 ounces of beer, 5 ounces of wine, or 1 ounces of hard liquor.  Do not use street drugs.  Do not share needles.  Ask your health care provider for help if you need support or information about quitting drugs.  Tell your health care provider if you often feel depressed.  Tell your health care provider if you have ever been abused or do not feel safe at home. This information is not intended to replace advice given to you by your health care provider. Make sure you discuss any questions you have with your health care provider. Document Released: 07/30/2010 Document Revised: 06/22/2015 Document Reviewed: 10/18/2014 Elsevier Interactive Patient Education  [employer].

## 2017-01-23 NOTE — Assessment & Plan Note (Signed)
Started smoking again after foot fracture, declines patch and would like to stop on her own

## 2017-01-24 ENCOUNTER — Other Ambulatory Visit (INDEPENDENT_AMBULATORY_CARE_PROVIDER_SITE_OTHER): Payer: Self-pay | Admitting: Specialist

## 2017-01-24 MED ORDER — ACETAMINOPHEN-CODEINE #4 300-60 MG PO TABS: 1.0000 | ORAL_TABLET | ORAL | 0 refills | Status: DC | PRN

## 2017-01-24 NOTE — Telephone Encounter (Signed)
Script called to pharmacy. I called patient and advised. I explained I did not have answer on knee brace and would send back to you to have Dr. Louanne Skye address beginning of next week.

## 2017-01-24 NOTE — Telephone Encounter (Signed)
I have printed an Rx for tyl #4, #30, I don't recommend that she stay on hydrocodone. jen

## 2017-01-24 NOTE — Telephone Encounter (Signed)
Tried to contact pt to inform her of below and phone only rang.  If pt calls back please inform her of below. Katharina Caper, Aerith Canal D, [place]

## 2017-01-27 NOTE — Telephone Encounter (Signed)
Patient is requesting an rx for a knee brace also.[name] Kitchen Please advise

## 2017-01-29 ENCOUNTER — Ambulatory Visit: Payer: Medicare Other | Attending: Specialist | Admitting: Physical Therapy

## 2017-01-29 DIAGNOSIS — M545 Low back pain: Secondary | ICD-10-CM | POA: Diagnosis not present

## 2017-01-29 DIAGNOSIS — M25672 Stiffness of left ankle, not elsewhere classified: Secondary | ICD-10-CM

## 2017-01-29 DIAGNOSIS — M25572 Pain in left ankle and joints of left foot: Secondary | ICD-10-CM | POA: Diagnosis not present

## 2017-01-29 DIAGNOSIS — M6281 Muscle weakness (generalized): Secondary | ICD-10-CM

## 2017-01-29 DIAGNOSIS — R262 Difficulty in walking, not elsewhere classified: Secondary | ICD-10-CM | POA: Insufficient documentation

## 2017-01-29 DIAGNOSIS — R293 Abnormal posture: Secondary | ICD-10-CM | POA: Diagnosis not present

## 2017-01-29 DIAGNOSIS — M25561 Pain in right knee: Secondary | ICD-10-CM | POA: Insufficient documentation

## 2017-01-29 NOTE — Therapy (Signed)
[place], [place], [address] Phone: [phone]   Fax:  [phone]  Physical Therapy Treatment  Patient Details  Name: [name] MRN: [id number] Date of Birth: [birth year] Referring Provider: Dr. [name]   Encounter Date: 01/29/2017  PT End of Session - 01/29/17 1037    Visit Number  2    Number of Visits  16    Date for PT Re-Evaluation  03/11/17    PT Start Time  0802    PT Stop Time  0846    PT Time Calculation (min)  44 min       Past Medical History:  Diagnosis Date  . Arthritis 04-10-11   hips, shoulders, back  . Asthma   . Bipolar disorder ([place])   . Cancer ([place]) 1993   cervical, no treatment done, went away per pt  . Cervical dysplasia or atypia 04-10-11   '93- once dx.-got pregnant-no intervention, then postpartum, no dysplasia found  . CHF (congestive heart failure) ([place])    no cardiologist 2014 dx, none now  . 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 Pathology showed Condyloma, gluteal CONDYLOMA ACUMINATUM.   [name] Kitchen COPD (chronic obstructive pulmonary disease) ([place])   . Depression   . Diabetes mellitus without complication ([place])   . Dyspnea    with actity - better since taking Singular  . Fall 12/16/2016  . Fibrocystic breast disease   . GERD (gastroesophageal reflux disease)   . Hypercalcemia   . Hyperlipidemia   . Hypertension   . Migraine headache    none recent  . Morbid obesity ([place]) 03/27/2006  . Neuropathy   . Skin lesion 03/15/2011   In gluteal crease now s/p removal by Dr. [name] of General Surgery on 3/12. Path shows condyloma.     . Sleep apnea 04-10-11   uses cpap, pt does not know settings  . Trigger finger    left third    Past Surgical History:  Procedure Laterality Date  . ANAL FISTULECTOMY  04/17/2011   Procedure: FISTULECTOMY ANAL;  Surgeon: Imogene Burn. [name], MD;  Location: [facility];  Service: General;  Laterality: N/A;  Excision of  Condyloma Gluteal Cleft   . BACK SURGERY  04-10-11   x5-Lumbar fusion-retained hardware.(Dr. Louanne Skye)  . BREAST CYST EXCISION     bilateral breast, 3 cysts removed from each breast  . BREAST EXCISIONAL BIOPSY Right    x 3  . BREAST EXCISIONAL BIOPSY Left    x 3  . CARPAL TUNNEL RELEASE Bilateral   . Cervical biospy    . COLONOSCOPY WITH PROPOFOL N/A 07/06/2015   Procedure: COLONOSCOPY WITH PROPOFOL;  Surgeon: [name], MD;  [facility];  Service: Endoscopy;  Laterality: N/A;  . DOPPLER ECHOCARDIOGRAPHY  04/06/2012   AT [place] 55-60%  . I&D EXTREMITY Left 12/18/2016   Procedure: IRRIGATION AND DEBRIDEMENT LEFT FOOT, CLOSURE;  Surgeon: [name], MD;  Location: [facility];  Service: Orthopedics;  Laterality: Left;  [name] Kitchen MULTIPLE TOOTH EXTRACTIONS    . NECK SURGERY  04-10-11   x3- cervical fusion with plating and screws-Dr. [name]  . TEE WITHOUT CARDIOVERSION N/A 04/06/2012   Procedure: TRANSESOPHAGEAL ECHOCARDIOGRAM (TEE);  Surgeon: [name], MD;  Location: [facility];  Service: Cardiovascular;  Laterality: N/A;  . TRIGGER FINGER RELEASE Right    middle finger  . TRIGGER FINGER RELEASE Left 06/28/2016   Procedure: RELEASE TRIGGER FINGER LEFT 3RD FINGER;  Surgeon: [name], MD;  Location: [facility];  Service: Orthopedics;  Laterality: Left;  . TRIGGER FINGER RELEASE Right 06/12/2016   Procedure: RIGHT INDEX FINGER TRIGGER RELEASE;  Surgeon: [name], MD;  Location: [facility];  Service: Orthopedics;  Laterality: Right;    There were no vitals filed for this visit.  Subjective Assessment - 01/29/17 0815    Subjective  Everything is a 10/10, knee is the worst, constant. The exercises seem to make pain worse. Trying ice 3 x per day on knee with no change.     Currently in Pain?  Yes    Pain Score  10-Worst pain ever    Pain Location  Knee    Pain Orientation  Left    Pain Descriptors / Indicators  Aching;Sharp;Burning;Throbbing    Pain Type  Chronic pain     Aggravating Factors   constant, every position    Pain Relieving Factors  none    Pain Score  10    Pain Location  Foot    Pain Orientation  Left    Pain Descriptors / Indicators  Throbbing    Aggravating Factors   constant, every position    Pain Relieving Factors  nothing                       [facility] Adult PT Treatment/Exercise - 01/29/17 0001      Knee/Hip Exercises: Seated   Long Arc Quad  10 reps    Ball Squeeze  x10     Marching  20 reps      Knee/Hip Exercises: Supine   Short Arc Quad Sets  10 reps    Heel Slides  10 reps    Other Supine Knee/Hip Exercises  ball squeeze x 10       Modalities   Modalities  Ultrasound      Ultrasound   Ultrasound Location  right medial knee    Ultrasound Parameters  pulsed 1.0 w/cm2 1 mhz    Ultrasound Goals  Pain      Manual Therapy   Manual Therapy  Taping    Manual therapy comments  soft tissue work medial knee into adductors    Kinesiotex  Facilitate Muscle      Kinesiotix   Facilitate Muscle   knee support                PT Short Term Goals - 01/14/17 1307      PT SHORT TERM GOAL #1   Title  Pt will be I with HEP for strength and ROM of ankle, knee     Time  3    Period  Weeks    Status  New    Target Date  02/04/17      PT SHORT TERM GOAL #2   Title  Pt will be able to walk with 25% less pain, limping for household mobility.     Time  3    Period  Weeks    Status  New    Target Date  02/04/17        PT Long Term Goals - 01/14/17 1328      PT LONG TERM GOAL #1   Title  Pt will be I with more advanced HEP for LEs     Time  8    Period  Weeks    Status  New    Target Date  03/11/17      PT LONG  TERM GOAL #2   Title  Pt will be able to walk without boot, LRAD in the community as needed with pain <4/10 in both knee and foot.     Time  8    Period  Weeks    Status  New    Target Date  03/11/17      PT LONG TERM GOAL #3   Title  Pt will demo L ankle DF anf PF to WNL for proper gait  mechanics (push off, toe clearance)    Baseline  Df to neutral and PF to about 26 deg     Time  8    Period  Weeks    Status  New    Target Date  03/11/17      PT LONG TERM GOAL #4   Title  AROM in R knee will equal that of the Lt. knee with minimal to no pain     Baseline  0-8-115  (goal 0-125)    Time  8    Period  Weeks    Status  New    Target Date  03/11/17      PT LONG TERM GOAL #5   Title  Pt will complete IADLs with [place] as previous to injury     Baseline  has CNA for heavier housework     Time  8    Period  Weeks    Status  New    Target Date  03/11/17            Plan - 01/29/17 1037    Clinical Impression Statement  Pt reports no improvement with HEP and feels the exercises exacerbate her pain in foot and knee. Today she rates her back, foot and knee at 10/10 pain however the knee is the most painul. She has tried ice 3 x per day without relief and is doing her HEP despite pain. She is tender along right pes anserine and into right hip adductors. Performed manual soft tissue work to medial knee and thigh without relief. Trial of pulsed [place] to medial knee without relief. Trial of KT tape for knee support with pt reporting pain still 10/10 in knee at end of session.      PT Next Visit Plan  check ROM, check hip strength, continue with pain relief  (so far IFC and [place] not helpful for knee)     PT Home Exercise Plan  level 1 knee anf ankle ROM     Consulted and Agree with Plan of Care  Patient       Patient will benefit from skilled therapeutic intervention in order to improve the following deficits and impairments:  Abnormal gait, Decreased balance, Decreased endurance, Decreased mobility, Decreased skin integrity, Difficulty walking, Hypomobility, Obesity, Improper body mechanics, Pain, Postural dysfunction, Impaired flexibility, Increased fascial restricitons, Decreased strength, Decreased activity tolerance, Decreased range of motion  Visit Diagnosis: Pain in  left ankle and joints of left foot  Acute pain of right knee  Low back pain, unspecified back pain laterality, unspecified chronicity, with sciatica presence unspecified  Muscle weakness (generalized)  Difficulty in walking, not elsewhere classified  Stiffness of left ankle, not elsewhere classified  Abnormal posture     Problem List Patient Active Problem List   Diagnosis Date Noted  . Chronic kidney disease (CKD), stage IV (severe) ([place]) 01/14/2017  . Fracture of fifth toe, left, open, initial encounter 12/18/2016  . Type 2 diabetes mellitus with stage 3 chronic kidney disease, with  long-term current use of insulin ([place]) 12/12/2016  . Substance abuse ([place]) 11/01/2013  . Chronic diastolic CHF (congestive heart failure) ([place]) 04/07/2012  . GERD 01/26/2010  . Hypertension 08/04/2008  . OBSTRUCTIVE SLEEP APNEA 02/03/2008  . FIBROCYSTIC BREAST DISEASE 10/28/2006  . Hyperlipidemia 03/27/2006  . Morbid obesity ([place]) 03/27/2006  . TOBACCO DEPENDENCE 03/27/2006  . Depression with anxiety 03/27/2006  . NEUROGENIC BLADDER 03/27/2006    Dorene Ar, PTA 01/29/2017, 10:49 AM  [facility] [address] [place], [place], [address] Phone: [phone]   Fax:  [phone]  Name: [name] MRN: [id number] Date of Birth: 1965/04/26

## 2017-01-29 NOTE — Telephone Encounter (Signed)
The size of her leg makes the use of a brace a poor choice, too much pressure and she risks skin irritation and skin break down. Too little immobilization does no good. Its like wearing a barrel with suspenders, not much support. I usually wait at least 3 weeks after a knee sprain before going to a brace. When we see her back I will decide if it is necessary. jen

## 2017-01-29 NOTE — Telephone Encounter (Signed)
I called and advised [name] of Dr. Otho Ket message below and she states that is fine with her

## 2017-01-31 ENCOUNTER — Encounter: Payer: Self-pay | Admitting: Physical Therapy

## 2017-01-31 ENCOUNTER — Ambulatory Visit: Payer: Medicare Other | Admitting: Physical Therapy

## 2017-01-31 DIAGNOSIS — M25561 Pain in right knee: Secondary | ICD-10-CM

## 2017-01-31 DIAGNOSIS — R262 Difficulty in walking, not elsewhere classified: Secondary | ICD-10-CM | POA: Diagnosis not present

## 2017-01-31 DIAGNOSIS — M6281 Muscle weakness (generalized): Secondary | ICD-10-CM | POA: Diagnosis not present

## 2017-01-31 DIAGNOSIS — M25572 Pain in left ankle and joints of left foot: Secondary | ICD-10-CM

## 2017-01-31 DIAGNOSIS — M25672 Stiffness of left ankle, not elsewhere classified: Secondary | ICD-10-CM | POA: Diagnosis not present

## 2017-01-31 DIAGNOSIS — R293 Abnormal posture: Secondary | ICD-10-CM | POA: Diagnosis not present

## 2017-01-31 DIAGNOSIS — M545 Low back pain: Secondary | ICD-10-CM

## 2017-01-31 NOTE — Therapy (Signed)
[place], [place], [address] Phone: [phone]   Fax:  [phone]  Physical Therapy Treatment  Patient Details  Name: [name] MRN: [id number] Date of Birth: [birth year] Referring Provider: Dr. [name]   Encounter Date: 01/31/2017  PT End of Session - 01/31/17 0801    Visit Number  3    Number of Visits  16    Date for PT Re-Evaluation  03/11/17    PT Start Time  2353    PT Stop Time  0827 Pt request to leave early     PT Time Calculation (min)  34 min       Past Medical History:  Diagnosis Date  . Arthritis 04-10-11   hips, shoulders, back  . Asthma   . Bipolar disorder ([place])   . Cancer ([place]) 1993   cervical, no treatment done, went away per pt  . Cervical dysplasia or atypia 04-10-11   '93- once dx.-got pregnant-no intervention, then postpartum, no dysplasia found  . CHF (congestive heart failure) ([place])    no cardiologist 2014 dx, none now  . 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 Pathology showed Condyloma, gluteal CONDYLOMA ACUMINATUM.   [name] Kitchen COPD (chronic obstructive pulmonary disease) ([place])   . Depression   . Diabetes mellitus without complication ([place])   . Dyspnea    with actity - better since taking Singular  . Fall 12/16/2016  . Fibrocystic breast disease   . GERD (gastroesophageal reflux disease)   . Hypercalcemia   . Hyperlipidemia   . Hypertension   . Migraine headache    none recent  . Morbid obesity ([place]) 03/27/2006  . Neuropathy   . Skin lesion 03/15/2011   In gluteal crease now s/p removal by Dr. [name] of General Surgery on 3/12. Path shows condyloma.     . Sleep apnea 04-10-11   uses cpap, pt does not know settings  . Trigger finger    left third    Past Surgical History:  Procedure Laterality Date  . ANAL FISTULECTOMY  04/17/2011   Procedure: FISTULECTOMY ANAL;  Surgeon: Imogene Burn. [name], MD;  [facility];  Service: General;   Laterality: N/A;  Excision of Condyloma Gluteal Cleft   . BACK SURGERY  04-10-11   x5-Lumbar fusion-retained hardware.(Dr. Louanne Skye)  . BREAST CYST EXCISION     bilateral breast, 3 cysts removed from each breast  . BREAST EXCISIONAL BIOPSY Right    x 3  . BREAST EXCISIONAL BIOPSY Left    x 3  . CARPAL TUNNEL RELEASE Bilateral   . Cervical biospy    . COLONOSCOPY WITH PROPOFOL N/A 07/06/2015   Procedure: COLONOSCOPY WITH PROPOFOL;  Surgeon: [name], MD;  [facility];  Service: Endoscopy;  Laterality: N/A;  . DOPPLER ECHOCARDIOGRAPHY  04/06/2012   AT [place] 55-60%  . I&D EXTREMITY Left 12/18/2016   Procedure: IRRIGATION AND DEBRIDEMENT LEFT FOOT, CLOSURE;  Surgeon: [name], MD;  Location: [place];  Service: Orthopedics;  Laterality: Left;  [name] Kitchen MULTIPLE TOOTH EXTRACTIONS    . NECK SURGERY  04-10-11   x3- cervical fusion with plating and screws-Dr. [name]  . TEE WITHOUT CARDIOVERSION N/A 04/06/2012   Procedure: TRANSESOPHAGEAL ECHOCARDIOGRAM (TEE);  Surgeon: [name], MD;  Location: [facility];  Service: Cardiovascular;  Laterality: N/A;  . TRIGGER FINGER RELEASE Right    middle finger  . TRIGGER FINGER RELEASE Left 06/28/2016   Procedure: RELEASE  TRIGGER FINGER LEFT 3RD FINGER;  Surgeon: [name], MD;  Location: [facility];  Service: Orthopedics;  Laterality: Left;  . TRIGGER FINGER RELEASE Right 06/12/2016   Procedure: RIGHT INDEX FINGER TRIGGER RELEASE;  Surgeon: [name], MD;  Location: [facility];  Service: Orthopedics;  Laterality: Right;    There were no vitals filed for this visit.  Subjective Assessment - 01/31/17 0813    Subjective  That tape is magical . It brought my pain now alot.     Currently in Pain?  Yes    Pain Score  10-Worst pain ever    Pain Location  Knee    Pain Orientation  Left    Pain Descriptors / Indicators  Aching;Sharp;Burning;Throbbing    Pain Score  9    Pain Location  Foot pinky toe     Pain Orientation  Left    Pain  Descriptors / Indicators  Throbbing                      [facility] Adult PT Treatment/Exercise - 01/31/17 0001      Knee/Hip Exercises: Aerobic   Nustep  L2 x 5 minutes LE only       Knee/Hip Exercises: Seated   Long Arc Quad  15 reps with ball squeeze     Ball Squeeze  x10     Marching  20 reps      Knee/Hip Exercises: Supine   Short Arc Quad Sets  10 reps    Heel Slides  10 reps    Straight Leg Raises  10 reps    Other Supine Knee/Hip Exercises  ball squeeze x 10       Kinesiotix   Facilitate Muscle   knee support       Ankle Exercises: Seated   Ankle Circles/Pumps  10 reps    Towel Crunch  5 reps    Other Seated Ankle Exercises  Toe Yoga              PT Education - 01/31/17 0810    Education provided  Yes    Education Details  HEP    Person(s) Educated  Patient    Methods  Explanation;Handout    Comprehension  Verbalized understanding       PT Short Term Goals - 01/14/17 1307      PT SHORT TERM GOAL #1   Title  Pt will be I with HEP for strength and ROM of ankle, knee     Time  3    Period  Weeks    Status  New    Target Date  02/04/17      PT SHORT TERM GOAL #2   Title  Pt will be able to walk with 25% less pain, limping for household mobility.     Time  3    Period  Weeks    Status  New    Target Date  02/04/17        PT Long Term Goals - 01/14/17 1328      PT LONG TERM GOAL #1   Title  Pt will be I with more advanced HEP for LEs     Time  8    Period  Weeks    Status  New    Target Date  03/11/17      PT LONG TERM GOAL #2   Title  Pt will be able to walk without boot, LRAD in the community as  needed with pain <4/10 in both knee and foot.     Time  8    Period  Weeks    Status  New    Target Date  03/11/17      PT LONG TERM GOAL #3   Title  Pt will demo L ankle DF anf PF to WNL for proper gait mechanics (push off, toe clearance)    Baseline  Df to neutral and PF to about 26 deg     Time  8    Period  Weeks    Status   New    Target Date  03/11/17      PT LONG TERM GOAL #4   Title  AROM in R knee will equal that of the Lt. knee with minimal to no pain     Baseline  0-8-115  (goal 0-125)    Time  8    Period  Weeks    Status  New    Target Date  03/11/17      PT LONG TERM GOAL #5   Title  Pt will complete IADLs with [place] as previous to injury     Baseline  has CNA for heavier housework     Time  8    Period  Weeks    Status  New    Target Date  03/11/17            Plan - 01/31/17 0811    Clinical Impression Statement  Pt reports pain reduced to 4/10 with kinesiotape for support on right knee. She is interested in purchasing tape for self application. Continued with Knee strengthening exercises and added toe yoga and scrunches for HEP. Reapplied KT tape and used a trial of 5th metatarsal tape for her foot pain.     PT Next Visit Plan  check ROM, check hip strength, continue with pain relief  (so far IFC and [place] not helpful for knee), KT tape, give tape info for purchase    PT Home Exercise Plan  level 1 knee anf ankle ROM , toe yoga    Consulted and Agree with Plan of Care  Patient       Patient will benefit from skilled therapeutic intervention in order to improve the following deficits and impairments:  Abnormal gait, Decreased balance, Decreased endurance, Decreased mobility, Decreased skin integrity, Difficulty walking, Hypomobility, Obesity, Improper body mechanics, Pain, Postural dysfunction, Impaired flexibility, Increased fascial restricitons, Decreased strength, Decreased activity tolerance, Decreased range of motion  Visit Diagnosis: Pain in left ankle and joints of left foot  Acute pain of right knee  Low back pain, unspecified back pain laterality, unspecified chronicity, with sciatica presence unspecified  Muscle weakness (generalized)  Difficulty in walking, not elsewhere classified     Problem List Patient Active Problem List   Diagnosis Date Noted  . Chronic  kidney disease (CKD), stage IV (severe) ([place]) 01/14/2017  . Fracture of fifth toe, left, open, initial encounter 12/18/2016  . Type 2 diabetes mellitus with stage 3 chronic kidney disease, with long-term current use of insulin ([place]) 12/12/2016  . Substance abuse ([place]) 11/01/2013  . Chronic diastolic CHF (congestive heart failure) ([place]) 04/07/2012  . GERD 01/26/2010  . Hypertension 08/04/2008  . OBSTRUCTIVE SLEEP APNEA 02/03/2008  . FIBROCYSTIC BREAST DISEASE 10/28/2006  . Hyperlipidemia 03/27/2006  . Morbid obesity ([place]) 03/27/2006  . TOBACCO DEPENDENCE 03/27/2006  . Depression with anxiety 03/27/2006  . NEUROGENIC BLADDER 03/27/2006    Dorene Ar, PTA 01/31/2017, 9:40  AM  [facility] [address] [place], [place], [address] Phone: [phone]   Fax:  [phone]  Name: RAINIE CRENSHAW MRN: [id number] Date of Birth: 1965-07-21

## 2017-02-05 ENCOUNTER — Encounter: Payer: Self-pay | Admitting: Physical Therapy

## 2017-02-05 ENCOUNTER — Ambulatory Visit: Payer: Medicare Other | Admitting: Physical Therapy

## 2017-02-05 DIAGNOSIS — M25572 Pain in left ankle and joints of left foot: Secondary | ICD-10-CM | POA: Diagnosis not present

## 2017-02-05 DIAGNOSIS — M25561 Pain in right knee: Secondary | ICD-10-CM

## 2017-02-05 DIAGNOSIS — M545 Low back pain: Secondary | ICD-10-CM

## 2017-02-05 DIAGNOSIS — M25672 Stiffness of left ankle, not elsewhere classified: Secondary | ICD-10-CM | POA: Diagnosis not present

## 2017-02-05 DIAGNOSIS — M6281 Muscle weakness (generalized): Secondary | ICD-10-CM | POA: Diagnosis not present

## 2017-02-05 DIAGNOSIS — R293 Abnormal posture: Secondary | ICD-10-CM

## 2017-02-05 DIAGNOSIS — R262 Difficulty in walking, not elsewhere classified: Secondary | ICD-10-CM | POA: Diagnosis not present

## 2017-02-05 NOTE — Therapy (Addendum)
[place], [place], [address] Phone: [phone]   Fax:  [phone]  Physical Therapy Treatment  Patient Details  Name: [name] MRN: [id number] Date of Birth: 08/16/1965 Referring Provider: Dr. [name]   Encounter Date: 02/05/2017  PT End of Session - 02/05/17 0808    Visit Number  4    Number of Visits  16    Date for PT Re-Evaluation  03/11/17    PT Start Time  0805    PT Stop Time  0845    PT Time Calculation (min)  40 min       Past Medical History:  Diagnosis Date  . Arthritis 04-10-11   hips, shoulders, back  . Asthma   . Bipolar disorder ([place])   . Cancer ([place]) 1993   cervical, no treatment done, went away per pt  . Cervical dysplasia or atypia 04-10-11   '93- once dx.-got pregnant-no intervention, then postpartum, no dysplasia found  . CHF (congestive heart failure) ([place])    no cardiologist 2014 dx, none now  . 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 Pathology showed Condyloma, gluteal CONDYLOMA ACUMINATUM.   [name] Kitchen COPD (chronic obstructive pulmonary disease) ([place])   . Depression   . Diabetes mellitus without complication ([place])   . Dyspnea    with actity - better since taking Singular  . Fall 12/16/2016  . Fibrocystic breast disease   . GERD (gastroesophageal reflux disease)   . Hypercalcemia   . Hyperlipidemia   . Hypertension   . Migraine headache    none recent  . Morbid obesity ([place]) 03/27/2006  . Neuropathy   . Skin lesion 03/15/2011   In gluteal crease now s/p removal by Dr. [name] of General Surgery on 3/12. Path shows condyloma.     . Sleep apnea 04-10-11   uses cpap, pt does not know settings  . Trigger finger    left third    Past Surgical History:  Procedure Laterality Date  . ANAL FISTULECTOMY  04/17/2011   Procedure: FISTULECTOMY ANAL;  Surgeon: Imogene Burn. [name], MD;  Location: [facility];  Service: General;  Laterality: N/A;  Excision of  Condyloma Gluteal Cleft   . BACK SURGERY  04-10-11   x5-Lumbar fusion-retained hardware.(Dr. Louanne Skye)  . BREAST CYST EXCISION     bilateral breast, 3 cysts removed from each breast  . BREAST EXCISIONAL BIOPSY Right    x 3  . BREAST EXCISIONAL BIOPSY Left    x 3  . CARPAL TUNNEL RELEASE Bilateral   . Cervical biospy    . COLONOSCOPY WITH PROPOFOL N/A 07/06/2015   Procedure: COLONOSCOPY WITH PROPOFOL;  Surgeon: [name], MD;  [facility];  Service: Endoscopy;  Laterality: N/A;  . DOPPLER ECHOCARDIOGRAPHY  04/06/2012   AT [place] 55-60%  . I&D EXTREMITY Left 12/18/2016   Procedure: IRRIGATION AND DEBRIDEMENT LEFT FOOT, CLOSURE;  Surgeon: [name], MD;  Location: [facility];  Service: Orthopedics;  Laterality: Left;  [name] Kitchen MULTIPLE TOOTH EXTRACTIONS    . NECK SURGERY  04-10-11   x3- cervical fusion with plating and screws-Dr. [name]  . TEE WITHOUT CARDIOVERSION N/A 04/06/2012   Procedure: TRANSESOPHAGEAL ECHOCARDIOGRAM (TEE);  Surgeon: [name], MD;  Location: [facility];  Service: Cardiovascular;  Laterality: N/A;  . TRIGGER FINGER RELEASE Right    middle finger  . TRIGGER FINGER RELEASE Left 06/28/2016   Procedure: RELEASE TRIGGER FINGER LEFT 3RD FINGER;  Surgeon: [name], MD;  Location: [facility];  Service: Orthopedics;  Laterality: Left;  . TRIGGER FINGER RELEASE Right 06/12/2016   Procedure: RIGHT INDEX FINGER TRIGGER RELEASE;  Surgeon: [name], MD;  Location: [facility];  Service: Orthopedics;  Laterality: Right;    There were no vitals filed for this visit.  Subjective Assessment - 02/05/17 0806    Subjective  The tape did not help last time, maybe it was the [place]  and tape combination. I can deal with the foot and back pain but the knee is constant, unrelenting pain.     Currently in Pain?  Yes    Pain Score  10-Worst pain ever    Pain Location  Knee    Pain Orientation  Left    Pain Descriptors / Indicators  Sharp;Aching    Aggravating Factors   standing     Pain Relieving Factors  lay on side and find best position for knee                       [facility] Adult PT Treatment/Exercise - 02/05/17 0001      Knee/Hip Exercises: Aerobic   Nustep  L 3 x 5 minutes, LE only       Knee/Hip Exercises: Seated   Long Arc Quad  20 reps    Ball Squeeze  x 20    Marching  20 reps      Knee/Hip Exercises: Supine   Quad Sets  20 reps towel under knee     Short Arc Quad Sets  20 reps    Straight Leg Raises  10 reps    Straight Leg Raise with External Rotation  5 reps      Ultrasound   Ultrasound Location  right medial knee    Ultrasound Parameters  pulsed 1.0 w/cm2 , 1 mhz     Ultrasound Goals  Pain      Manual Therapy   Manual Therapy  Taping    Kinesiotex  Facilitate Muscle medial knee pain tape                PT Short Term Goals - 01/14/17 1307      PT SHORT TERM GOAL #1   Title  Pt will be I with HEP for strength and ROM of ankle, knee     Time  3    Period  Weeks    Status  New    Target Date  02/04/17      PT SHORT TERM GOAL #2   Title  Pt will be able to walk with 25% less pain, limping for household mobility.     Time  3    Period  Weeks    Status  New    Target Date  02/04/17        PT Long Term Goals - 01/14/17 1328      PT LONG TERM GOAL #1   Title  Pt will be I with more advanced HEP for LEs     Time  8    Period  Weeks    Status  New    Target Date  03/11/17      PT LONG TERM GOAL #2   Title  Pt will be able to walk without boot, LRAD in the community as needed with pain <4/10 in both knee and foot.     Time  8    Period  Weeks    Status  New    Target Date  03/11/17      PT LONG TERM GOAL #3   Title  Pt will demo L ankle DF anf PF to WNL for proper gait mechanics (push off, toe clearance)    Baseline  Df to neutral and PF to about 26 deg     Time  8    Period  Weeks    Status  New    Target Date  03/11/17      PT LONG TERM GOAL #4   Title  AROM in R knee will equal that of the  Lt. knee with minimal to no pain     Baseline  0-8-115  (goal 0-125)    Time  8    Period  Weeks    Status  New    Target Date  03/11/17      PT LONG TERM GOAL #5   Title  Pt will complete IADLs with [place] as previous to injury     Baseline  has CNA for heavier housework     Time  8    Period  Weeks    Status  New    Target Date  03/11/17            Plan - 02/05/17 1021    Clinical Impression Statement  Pt reports last tape application did not help knee pain. Reviewed exercises with pt c/o pain throughout. Repeated medial knee [place] and applied medial knee strain KT tape. Afterward, pt reports a little less pain.     PT Next Visit Plan  check goals, check hip strength, continue with pain relief strategies, assess KT tape application, try mcconnel tape?     PT Home Exercise Plan  level 1 knee anf ankle ROM , toe yoga    Consulted and Agree with Plan of Care  Patient       Patient will benefit from skilled therapeutic intervention in order to improve the following deficits and impairments:  Abnormal gait, Decreased balance, Decreased endurance, Decreased mobility, Decreased skin integrity, Difficulty walking, Hypomobility, Obesity, Improper body mechanics, Pain, Postural dysfunction, Impaired flexibility, Increased fascial restricitons, Decreased strength, Decreased activity tolerance, Decreased range of motion  Visit Diagnosis: Pain in left ankle and joints of left foot  Acute pain of right knee  Low back pain, unspecified back pain laterality, unspecified chronicity, with sciatica presence unspecified  Muscle weakness (generalized)  Difficulty in walking, not elsewhere classified  Stiffness of left ankle, not elsewhere classified  Abnormal posture     Problem List Patient Active Problem List   Diagnosis Date Noted  . Chronic kidney disease (CKD), stage IV (severe) ([place]) 01/14/2017  . Fracture of fifth toe, left, open, initial encounter 12/18/2016  . Type 2  diabetes mellitus with stage 3 chronic kidney disease, with long-term current use of insulin ([place]) 12/12/2016  . Substance abuse ([place]) 11/01/2013  . Chronic diastolic CHF (congestive heart failure) ([place]) 04/07/2012  . GERD 01/26/2010  . Hypertension 08/04/2008  . OBSTRUCTIVE SLEEP APNEA 02/03/2008  . FIBROCYSTIC BREAST DISEASE 10/28/2006  . Hyperlipidemia 03/27/2006  . Morbid obesity ([place]) 03/27/2006  . TOBACCO DEPENDENCE 03/27/2006  . Depression with anxiety 03/27/2006  . NEUROGENIC BLADDER 03/27/2006    Dorene Ar, PTA 02/05/2017, 10:40 AM  [facility] [address] [place], [place], [address] Phone: [phone]   Fax:  [phone]  Name: [name] MRN: [id number] Date of Birth: Sep 11, 1965   PHYSICAL THERAPY DISCHARGE SUMMARY  Visits from Start of Care: 4  Current functional level related to goals / functional outcomes: See above    Remaining deficits: See above, unknown as of today   Education / Equipment: HEP, posture Plan: Patient agrees to discharge.  Patient goals were not met. Patient is being discharged due to not returning since the last visit.  ?????    Raeford Razor, PT 03/06/17 1:14 PM Phone: [phone] Fax: [phone]

## 2017-02-06 ENCOUNTER — Ambulatory Visit (INDEPENDENT_AMBULATORY_CARE_PROVIDER_SITE_OTHER): Payer: Medicare Other | Admitting: Specialist

## 2017-02-06 ENCOUNTER — Encounter (INDEPENDENT_AMBULATORY_CARE_PROVIDER_SITE_OTHER): Payer: Self-pay | Admitting: Specialist

## 2017-02-06 VITALS — BP 151/92 | HR 86 | Ht 70.0 in | Wt 368.0 lb

## 2017-02-06 DIAGNOSIS — M25561 Pain in right knee: Secondary | ICD-10-CM

## 2017-02-06 DIAGNOSIS — S83411D Sprain of medial collateral ligament of right knee, subsequent encounter: Secondary | ICD-10-CM | POA: Diagnosis not present

## 2017-02-06 MED ORDER — HYDROCODONE-ACETAMINOPHEN 7.5-325 MG PO TABS: 1.0000 | ORAL_TABLET | Freq: Four times a day (QID) | ORAL | 0 refills | Status: DC | PRN

## 2017-02-06 NOTE — Addendum Note (Signed)
Addended by: [name] on: 02/06/2017 12:31 PM   Modules accepted: Orders

## 2017-02-06 NOTE — Patient Instructions (Addendum)
  Knee is suffering from osteoarthritis, only real proven treatments are Weight loss, NSIADs like diclofenac and exercise. Well padded shoes help. Ice the knee 2-3 times a day 15-20 mins at a time. Ice the right medial knee (inner knee) 3-4 times per day for 15-20 minutes to decrease pain and swelling. MRI of the right knee is ordered to assess for bone injury.  Use a walker to relieve pain by placing only one half of weight on the right leg. Take a baby aspirin once a day.

## 2017-02-06 NOTE — Progress Notes (Signed)
Office Visit Note   Patient: [name]           Date of Birth: [birth year]           MRN: [id number] Visit Date: 02/06/2017              Requested by: [name], MD [address] [place], [place] [address] PCP: [name], MD   Assessment & Plan: Visit Diagnoses:  1. Sprain of medial collateral ligament of right knee, subsequent encounter   2. Right medial knee pain     Plan: Knee is suffering from osteoarthritis, only real proven treatments are Weight loss, NSIADs like diclofenac and exercise. Well padded shoes help. Ice the knee 2-3 times a day 15-20 mins at a time. Ice the right medial knee (inner knee) 3-4 times per day for 15-20 minutes to decrease pain and swelling. MRI of the right knee is ordered to assess for bone injury.  Use a walker to relieve pain by placing only one half of weight on the right leg Take a baby aspirin once a day.  Follow-Up Instructions: Return in about 2 weeks (around 02/20/2017).   Orders:  No orders of the defined types were placed in this encounter.  No orders of the defined types were placed in this encounter.     Procedures: No procedures performed   Clinical Data: No additional findings.   Subjective: Chief Complaint  Patient presents with  . Lower Back - Follow-up  . Right Knee - Pain    52 year old female with right knee pain medially, lying, walking or standing she is experiencing persistent pain and discomfort. She has been working out in PT since early part of December Without much help. Injury happened 11/19 when she fell coming out of the bathroom at home. Fell onto the right knee. No swelling, does experience night pain. She has to bend the right knee to  A certain angle to get relief and if she moves it the pain comes on her. She is not using a walker, she does use the walker or a cane if she has too. No bowel or bladder difficulty.     Review of Systems  Constitutional: Negative.   HENT: Negative.     Eyes: Negative.   Respiratory: Negative.   Cardiovascular: Negative.   Gastrointestinal: Negative.   Endocrine: Negative.   Genitourinary: Negative.   Musculoskeletal: Negative.   Skin: Negative.   Allergic/Immunologic: Negative.   Neurological: Negative.   Hematological: Negative.   Psychiatric/Behavioral: Negative.      Objective: Vital Signs: BP (!) 151/92 (BP Location: Right Arm, Patient Position: Sitting)   Pulse 86   Ht 5\' 10"  (1.778 m)   Wt (!) 368 lb (166.9 kg)   LMP 04/10/2006   BMI 52.80 kg/m   Physical Exam  Constitutional: She is oriented to person, place, and time. She appears well-developed and well-nourished.  HENT:  Head: Normocephalic and atraumatic.  Eyes: EOM are normal. Pupils are equal, round, and reactive to light.  Neck: Normal range of motion. Neck supple.  Pulmonary/Chest: Effort normal and breath sounds normal.  Abdominal: Soft. Bowel sounds are normal.  Musculoskeletal: Normal range of motion.       Right knee: She exhibits no effusion.  Neurological: She is alert and oriented to person, place, and time.  Skin: Skin is warm and dry.  Psychiatric: She has a normal mood and affect. Her behavior is normal. Judgment and thought content normal.  Right Knee Exam   Tenderness  The patient is experiencing tenderness in the MCL and medial retinaculum.  Range of Motion  Extension: normal  Flexion: 120   Tests  McMurray:  Medial - negative Lateral - negative Varus: positive Valgus: positive Lachman:  Anterior - negative    Posterior - negative Drawer:  Anterior - negative    Posterior - negative Pivot shift: negative Patellar apprehension: negative  Other  Erythema: absent Scars: absent Sensation: normal Pulse: present Swelling: none Effusion: no effusion present  Comments:  Pain is medial parapatella and medial joint line right knee. No effusion. Full extension,       Specialty Comments:  No specialty comments  available.  Imaging: No results found.   PMFS History: Patient Active Problem List   Diagnosis Date Noted  . Chronic kidney disease (CKD), stage IV (severe) ([place]) 01/14/2017  . Fracture of fifth toe, left, open, initial encounter 12/18/2016  . Type 2 diabetes mellitus with stage 3 chronic kidney disease, with long-term current use of insulin ([place]) 12/12/2016  . Substance abuse ([place]) 11/01/2013  . Chronic diastolic CHF (congestive heart failure) ([place]) 04/07/2012  . GERD 01/26/2010  . Hypertension 08/04/2008  . OBSTRUCTIVE SLEEP APNEA 02/03/2008  . FIBROCYSTIC BREAST DISEASE 10/28/2006  . Hyperlipidemia 03/27/2006  . Morbid obesity ([place]) 03/27/2006  . TOBACCO DEPENDENCE 03/27/2006  . Depression with anxiety 03/27/2006  . NEUROGENIC BLADDER 03/27/2006   Past Medical History:  Diagnosis Date  . Arthritis 04-10-11   hips, shoulders, back  . Asthma   . Bipolar disorder ([place])   . Cancer ([place]) 1993   cervical, no treatment done, went away per pt  . Cervical dysplasia or atypia 04-10-11   '93- once dx.-got pregnant-no intervention, then postpartum, no dysplasia found  . CHF (congestive heart failure) ([place])    no cardiologist 2014 dx, none now  . 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 Pathology showed Condyloma, gluteal CONDYLOMA ACUMINATUM.   [name] Kitchen COPD (chronic obstructive pulmonary disease) ([place])   . Depression   . Diabetes mellitus without complication ([place])   . Dyspnea    with actity - better since taking Singular  . Fall 12/16/2016  . Fibrocystic breast disease   . GERD (gastroesophageal reflux disease)   . Hypercalcemia   . Hyperlipidemia   . Hypertension   . Migraine headache    none recent  . Morbid obesity ([place]) 03/27/2006  . Neuropathy   . Skin lesion 03/15/2011   In gluteal crease now s/p removal by Dr. [name] of General Surgery on 3/12. Path shows condyloma.     . Sleep apnea 04-10-11   uses cpap, pt does not know settings  . Trigger finger     left third    Family History  Problem Relation Age of Onset  . Stroke Father   . Cancer Maternal Aunt        breast  . Asthma Other   . COPD Other   . Hypertension Other   . Diabetes Other     Past Surgical History:  Procedure Laterality Date  . ANAL FISTULECTOMY  04/17/2011   Procedure: FISTULECTOMY ANAL;  Surgeon: Imogene Burn. [name], MD;  [facility];  Service: General;  Laterality: N/A;  Excision of Condyloma Gluteal Cleft   . BACK SURGERY  04-10-11   x5-Lumbar fusion-retained hardware.(Dr. Louanne Skye)  . BREAST CYST EXCISION     bilateral breast, 3 cysts removed from each breast  . BREAST EXCISIONAL BIOPSY Right  x 3  . BREAST EXCISIONAL BIOPSY Left    x 3  . CARPAL TUNNEL RELEASE Bilateral   . Cervical biospy    . COLONOSCOPY WITH PROPOFOL N/A 07/06/2015   Procedure: COLONOSCOPY WITH PROPOFOL;  Surgeon: [name], MD;  [facility];  Service: Endoscopy;  Laterality: N/A;  . DOPPLER ECHOCARDIOGRAPHY  04/06/2012   AT [place] 55-60%  . I&D EXTREMITY Left 12/18/2016   Procedure: IRRIGATION AND DEBRIDEMENT LEFT FOOT, CLOSURE;  Surgeon: [name], MD;  Location: [facility];  Service: Orthopedics;  Laterality: Left;  [name] Kitchen MULTIPLE TOOTH EXTRACTIONS    . NECK SURGERY  04-10-11   x3- cervical fusion with plating and screws-Dr. [name]  . TEE WITHOUT CARDIOVERSION N/A 04/06/2012   Procedure: TRANSESOPHAGEAL ECHOCARDIOGRAM (TEE);  Surgeon: [name], MD;  Location: [name] ENDOSCOPY;  Service: Cardiovascular;  Laterality: N/A;  . TRIGGER FINGER RELEASE Right    middle finger  . TRIGGER FINGER RELEASE Left 06/28/2016   Procedure: RELEASE TRIGGER FINGER LEFT 3RD FINGER;  Surgeon: [name], MD;  Location: [facility];  Service: Orthopedics;  Laterality: Left;  . TRIGGER FINGER RELEASE Right 06/12/2016   Procedure: RIGHT INDEX FINGER TRIGGER RELEASE;  Surgeon: [name], MD;  Location: [facility];  Service: Orthopedics;  Laterality: Right;   Social History   Occupational  History  . Not on file  Tobacco Use  . Smoking status: Current Every Day Smoker    Packs/day: 0.50    Years: 39.00    Pack years: 19.50    Types: Cigarettes    Start date: 01/28/1977  . Smokeless tobacco: Never Used  . Tobacco comment: Previous 1 ppd,  Quit during hospit. 12/16/2016  Substance and Sexual Activity  . Alcohol use: No    Alcohol/week: 0.0 oz  . Drug use: Yes    Types: Marijuana, "Crack" cocaine    Comment: hx marijuana usemany years ago, Crack -none since 11/2015  . Sexual activity: Yes    Partners: Male

## 2017-02-07 ENCOUNTER — Ambulatory Visit: Payer: Medicare Other | Admitting: Physical Therapy

## 2017-02-11 ENCOUNTER — Other Ambulatory Visit: Payer: Self-pay | Admitting: Family Medicine

## 2017-02-11 DIAGNOSIS — J302 Other seasonal allergic rhinitis: Secondary | ICD-10-CM

## 2017-02-12 ENCOUNTER — Ambulatory Visit: Payer: Medicare Other | Admitting: Physical Therapy

## 2017-02-13 DIAGNOSIS — H52203 Unspecified astigmatism, bilateral: Secondary | ICD-10-CM | POA: Diagnosis not present

## 2017-02-13 DIAGNOSIS — H5213 Myopia, bilateral: Secondary | ICD-10-CM | POA: Diagnosis not present

## 2017-02-13 DIAGNOSIS — E119 Type 2 diabetes mellitus without complications: Secondary | ICD-10-CM | POA: Diagnosis not present

## 2017-02-13 DIAGNOSIS — H524 Presbyopia: Secondary | ICD-10-CM | POA: Diagnosis not present

## 2017-02-13 DIAGNOSIS — Z794 Long term (current) use of insulin: Secondary | ICD-10-CM | POA: Diagnosis not present

## 2017-02-13 LAB — HM DIABETES EYE EXAM

## 2017-02-14 ENCOUNTER — Ambulatory Visit: Payer: Medicare Other | Admitting: Physical Therapy

## 2017-02-14 ENCOUNTER — Other Ambulatory Visit: Payer: Medicare Other

## 2017-02-14 ENCOUNTER — Encounter: Payer: Medicare Other | Admitting: Podiatry

## 2017-02-17 ENCOUNTER — Ambulatory Visit: Payer: Medicare Other | Admitting: Student in an Organized Health Care Education/Training Program

## 2017-02-21 ENCOUNTER — Ambulatory Visit (INDEPENDENT_AMBULATORY_CARE_PROVIDER_SITE_OTHER): Payer: Medicare Other | Admitting: Orthopaedic Surgery

## 2017-02-27 ENCOUNTER — Ambulatory Visit
Admission: RE | Admit: 2017-02-27 | Discharge: 2017-02-27 | Disposition: A | Discharge status: Home / Self Care | Payer: Medicare Other | Source: Ambulatory Visit | Attending: Specialist | Admitting: Specialist

## 2017-02-27 DIAGNOSIS — S83511A Sprain of anterior cruciate ligament of right knee, initial encounter: Secondary | ICD-10-CM | POA: Diagnosis not present

## 2017-02-27 DIAGNOSIS — S83411D Sprain of medial collateral ligament of right knee, subsequent encounter: Secondary | ICD-10-CM

## 2017-02-27 DIAGNOSIS — M25561 Pain in right knee: Secondary | ICD-10-CM

## 2017-03-07 ENCOUNTER — Encounter (INDEPENDENT_AMBULATORY_CARE_PROVIDER_SITE_OTHER): Payer: Self-pay | Admitting: Specialist

## 2017-03-07 ENCOUNTER — Ambulatory Visit (INDEPENDENT_AMBULATORY_CARE_PROVIDER_SITE_OTHER): Payer: Medicare Other | Admitting: Specialist

## 2017-03-07 VITALS — BP 141/82 | HR 103 | Ht 70.0 in | Wt 368.0 lb

## 2017-03-07 DIAGNOSIS — S83411S Sprain of medial collateral ligament of right knee, sequela: Secondary | ICD-10-CM

## 2017-03-07 DIAGNOSIS — S83411A Sprain of medial collateral ligament of right knee, initial encounter: Secondary | ICD-10-CM | POA: Diagnosis not present

## 2017-03-07 MED ORDER — HYDROXYZINE PAMOATE 100 MG PO CAPS: 100.0000 mg | ORAL_CAPSULE | Freq: Three times a day (TID) | ORAL | 0 refills | Status: DC | PRN

## 2017-03-07 MED ORDER — ACETAMINOPHEN-CODEINE #4 300-60 MG PO TABS: 1.0000 | ORAL_TABLET | ORAL | 0 refills | Status: DC | PRN

## 2017-03-07 NOTE — Patient Instructions (Signed)
  Knee is suffering from osteoarthritis, only real proven treatments are Weight loss, you can not take NSIADs and exercise. Well padded shoes help. Ice the knee 2-3 times a day 15-20 mins at a time. Tylenol #4 1 every 6-8 hours for pain.

## 2017-03-07 NOTE — Progress Notes (Signed)
Office Visit Note   Patient: [name]           Date of Birth: 19-Oct-1965           MRN: [id number] Visit Date: 03/07/2017              Requested by: [name], MD [address] [place],  [id number] PCP: [name], MD   Assessment & Plan: Visit Diagnoses:  1. Complete tear of medial collateral ligament of right knee, sequela     Plan:  Knee is suffering from osteoarthritis, only real proven treatments are Weight loss, you can not take NSIADs and exercise. Well padded shoes help. Ice the knee 2-3 times a day 15-20 mins at a time. Tylenol #4 1 every 6-8 hours for pain.  Follow-Up Instructions: Return in about 3 weeks (around 03/28/2017) for Return appointment with Dr. Marlou Sa for persistenta pain MCL 3 mo post injury , MRI thin prox attach.   Orders:  No orders of the defined types were placed in this encounter.  Meds ordered this encounter  Medications  . acetaminophen-codeine (TYLENOL #4) 300-60 MG tablet    Sig: Take 1 tablet by mouth every 4 (four) hours as needed for moderate pain.    Dispense:  30 tablet    Refill:  0  . hydrOXYzine (VISTARIL) 100 MG capsule    Sig: Take 1 capsule (100 mg total) by mouth 3 (three) times daily as needed for itching.    Dispense:  30 capsule    Refill:  0      Procedures: No procedures performed   Clinical Data: Findings:  Medial collateral ligament sprain with thinning of the proximal attachment, both medial and lateral menisci are intact, no tear, mild posterior surface of patella fissuring. No acute bone changes, ACL and PCL are normal.    Subjective: Chief Complaint  Patient presents with  . Right Knee - Follow-up    MRI Review    52 year old female with history of right knee pain for years, worsened post fall with 12/16/2016. She is still having pain with standing and walking and even when lying down the pain Is present. Moving the right knee in her sleep awakens her. No numbness or tingling. She had  broken the toes and they are healing. Here for results of the MRI.    Review of Systems  Constitutional: Negative.   HENT: Negative.   Eyes: Negative.   Respiratory: Negative.   Cardiovascular: Negative.   Gastrointestinal: Negative.   Endocrine: Negative.   Genitourinary: Negative.   Musculoskeletal: Negative.   Skin: Negative.   Allergic/Immunologic: Negative.   Neurological: Negative.   Hematological: Negative.   Psychiatric/Behavioral: Negative.      Objective: Vital Signs: BP (!) 141/82 (BP Location: Left Arm, Patient Position: Sitting)   Pulse (!) 103   Ht 5\' 10"  (1.778 m)   Wt (!) 368 lb (166.9 kg)   LMP 04/10/2006   BMI 52.80 kg/m   Physical Exam  Constitutional: She is oriented to person, place, and time. She appears well-developed and well-nourished.  HENT:  Head: Normocephalic and atraumatic.  Eyes: EOM are normal. Pupils are equal, round, and reactive to light.  Neck: Normal range of motion. Neck supple.  Pulmonary/Chest: Effort normal and breath sounds normal.  Abdominal: Soft. Bowel sounds are normal.  Musculoskeletal:       Right knee: She exhibits no effusion.  Neurological: She is alert and oriented to person, place, and time.  Skin: Skin is warm and dry.  Psychiatric: She has a normal mood and affect. Her behavior is normal. Judgment and thought content normal.    Right Knee Exam   Tenderness  The patient is experiencing tenderness in the MCL and medial retinaculum.  Range of Motion  Extension:  -5 abnormal  Flexion: 120   Tests  McMurray:  Medial - negative Lateral - negative Valgus: positive Lachman:  Anterior - negative    Posterior - negative Drawer:  Anterior - negative    Posterior - negative Pivot shift: negative Patellar apprehension: negative  Other  Erythema: absent Scars: absent Sensation: normal Swelling: mild Effusion: no effusion present  Comments:  Tender right MCL proximally.      Specialty Comments:  No  specialty comments available.  Imaging: No results found.   PMFS History: Patient Active Problem List   Diagnosis Date Noted  . Chronic kidney disease (CKD), stage IV (severe) ([place]) 01/14/2017  . Fracture of fifth toe, left, open, initial encounter 12/18/2016  . Type 2 diabetes mellitus with stage 3 chronic kidney disease, with long-term current use of insulin ([place]) 12/12/2016  . Substance abuse ([place]) 11/01/2013  . Chronic diastolic CHF (congestive heart failure) ([place]) 04/07/2012  . GERD 01/26/2010  . Hypertension 08/04/2008  . OBSTRUCTIVE SLEEP APNEA 02/03/2008  . FIBROCYSTIC BREAST DISEASE 10/28/2006  . Hyperlipidemia 03/27/2006  . Morbid obesity ([place]) 03/27/2006  . TOBACCO DEPENDENCE 03/27/2006  . Depression with anxiety 03/27/2006  . NEUROGENIC BLADDER 03/27/2006   Past Medical History:  Diagnosis Date  . Arthritis 04-10-11   hips, shoulders, back  . Asthma   . Bipolar disorder ([place])   . Cancer ([place]) 1993   cervical, no treatment done, went away per pt  . Cervical dysplasia or atypia 04-10-11   '93- once dx.-got pregnant-no intervention, then postpartum, no dysplasia found  . CHF (congestive heart failure) ([place])    no cardiologist 2014 dx, none now  . 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 Pathology showed Condyloma, gluteal CONDYLOMA ACUMINATUM.   [name] Kitchen COPD (chronic obstructive pulmonary disease) ([place])   . Depression   . Diabetes mellitus without complication ([place])   . Dyspnea    with actity - better since taking Singular  . Fall 12/16/2016  . Fibrocystic breast disease   . GERD (gastroesophageal reflux disease)   . Hypercalcemia   . Hyperlipidemia   . Hypertension   . Migraine headache    none recent  . Morbid obesity ([place]) 03/27/2006  . Neuropathy   . Skin lesion 03/15/2011   In gluteal crease now s/p removal by Dr. [name] of General Surgery on 3/12. Path shows condyloma.     . Sleep apnea 04-10-11   uses cpap, pt does not know settings   . Trigger finger    left third    Family History  Problem Relation Age of Onset  . Stroke Father   . Cancer Maternal Aunt        breast  . Asthma Other   . COPD Other   . Hypertension Other   . Diabetes Other     Past Surgical History:  Procedure Laterality Date  . ANAL FISTULECTOMY  04/17/2011   Procedure: FISTULECTOMY ANAL;  Surgeon: Imogene Burn. [name], MD;  Location: [facility];  Service: General;  Laterality: N/A;  Excision of Condyloma Gluteal Cleft   . BACK SURGERY  04-10-11   x5-Lumbar fusion-retained hardware.(Dr. Louanne Skye)  . BREAST CYST EXCISION  bilateral breast, 3 cysts removed from each breast  . BREAST EXCISIONAL BIOPSY Right    x 3  . BREAST EXCISIONAL BIOPSY Left    x 3  . CARPAL TUNNEL RELEASE Bilateral   . Cervical biospy    . COLONOSCOPY WITH PROPOFOL N/A 07/06/2015   Procedure: COLONOSCOPY WITH PROPOFOL;  Surgeon: [name], MD;  Location: [facility];  Service: Endoscopy;  Laterality: N/A;  . DOPPLER ECHOCARDIOGRAPHY  04/06/2012   AT [place] 55-60%  . I&D EXTREMITY Left 12/18/2016   Procedure: IRRIGATION AND DEBRIDEMENT LEFT FOOT, CLOSURE;  Surgeon: [name], MD;  Location: [facility];  Service: Orthopedics;  Laterality: Left;  [name] Kitchen MULTIPLE TOOTH EXTRACTIONS    . NECK SURGERY  04-10-11   x3- cervical fusion with plating and screws-Dr. [name]  . TEE WITHOUT CARDIOVERSION N/A 04/06/2012   Procedure: TRANSESOPHAGEAL ECHOCARDIOGRAM (TEE);  Surgeon: [name], MD;  Location: [facility];  Service: Cardiovascular;  Laterality: N/A;  . TRIGGER FINGER RELEASE Right    middle finger  . TRIGGER FINGER RELEASE Left 06/28/2016   Procedure: RELEASE TRIGGER FINGER LEFT 3RD FINGER;  Surgeon: [name], MD;  Location: [facility];  Service: Orthopedics;  Laterality: Left;  . TRIGGER FINGER RELEASE Right 06/12/2016   Procedure: RIGHT INDEX FINGER TRIGGER RELEASE;  Surgeon: [name], MD;  Location: [facility];  Service: Orthopedics;  Laterality: Right;   Social  History   Occupational History  . Not on file  Tobacco Use  . Smoking status: Current Every Day Smoker    Packs/day: 0.50    Years: 39.00    Pack years: 19.50    Types: Cigarettes    Start date: 01/28/1977  . Smokeless tobacco: Never Used  . Tobacco comment: Previous 1 ppd,  Quit during hospit. 12/16/2016  Substance and Sexual Activity  . Alcohol use: No    Alcohol/week: 0.0 oz  . Drug use: Yes    Types: Marijuana, "Crack" cocaine    Comment: hx marijuana usemany years ago, Crack -none since 11/2015  . Sexual activity: Yes    Partners: Male

## 2017-03-10 ENCOUNTER — Other Ambulatory Visit: Payer: Self-pay | Admitting: Student in an Organized Health Care Education/Training Program

## 2017-03-10 DIAGNOSIS — E118 Type 2 diabetes mellitus with unspecified complications: Secondary | ICD-10-CM

## 2017-03-11 ENCOUNTER — Ambulatory Visit (INDEPENDENT_AMBULATORY_CARE_PROVIDER_SITE_OTHER): Payer: Medicare Other | Admitting: Specialist

## 2017-03-11 ENCOUNTER — Other Ambulatory Visit: Payer: Self-pay

## 2017-03-11 DIAGNOSIS — E118 Type 2 diabetes mellitus with unspecified complications: Secondary | ICD-10-CM

## 2017-03-11 NOTE — Telephone Encounter (Signed)
[employer] sent a fax asking for verification of directions. Patient said something about dose changing. [name], CMA

## 2017-03-12 ENCOUNTER — Encounter (HOSPITAL_COMMUNITY): Payer: Self-pay

## 2017-03-12 ENCOUNTER — Emergency Department (HOSPITAL_COMMUNITY): Payer: Medicare Other

## 2017-03-12 ENCOUNTER — Emergency Department (HOSPITAL_COMMUNITY)
Admission: EM | Admit: 2017-03-12 | Discharge: 2017-03-12 | Disposition: A | Discharge status: Home / Self Care | Payer: Medicare Other | Attending: Emergency Medicine | Admitting: Emergency Medicine

## 2017-03-12 ENCOUNTER — Other Ambulatory Visit: Payer: Self-pay

## 2017-03-12 DIAGNOSIS — R030 Elevated blood-pressure reading, without diagnosis of hypertension: Secondary | ICD-10-CM | POA: Diagnosis not present

## 2017-03-12 DIAGNOSIS — E1122 Type 2 diabetes mellitus with diabetic chronic kidney disease: Secondary | ICD-10-CM | POA: Insufficient documentation

## 2017-03-12 DIAGNOSIS — Z79899 Other long term (current) drug therapy: Secondary | ICD-10-CM | POA: Insufficient documentation

## 2017-03-12 DIAGNOSIS — M545 Low back pain, unspecified: Secondary | ICD-10-CM

## 2017-03-12 DIAGNOSIS — I130 Hypertensive heart and chronic kidney disease with heart failure and stage 1 through stage 4 chronic kidney disease, or unspecified chronic kidney disease: Secondary | ICD-10-CM | POA: Diagnosis not present

## 2017-03-12 DIAGNOSIS — N183 Chronic kidney disease, stage 3 (moderate): Secondary | ICD-10-CM | POA: Diagnosis not present

## 2017-03-12 DIAGNOSIS — Z794 Long term (current) use of insulin: Secondary | ICD-10-CM | POA: Diagnosis not present

## 2017-03-12 DIAGNOSIS — F17210 Nicotine dependence, cigarettes, uncomplicated: Secondary | ICD-10-CM | POA: Diagnosis not present

## 2017-03-12 DIAGNOSIS — I5032 Chronic diastolic (congestive) heart failure: Secondary | ICD-10-CM | POA: Diagnosis not present

## 2017-03-12 DIAGNOSIS — J449 Chronic obstructive pulmonary disease, unspecified: Secondary | ICD-10-CM | POA: Insufficient documentation

## 2017-03-12 DIAGNOSIS — J45909 Unspecified asthma, uncomplicated: Secondary | ICD-10-CM | POA: Diagnosis not present

## 2017-03-12 MED ORDER — INSULIN GLARGINE 100 UNIT/ML SOLOSTAR PEN: PEN_INJECTOR | SUBCUTANEOUS | 99 refills | Status: DC

## 2017-03-12 MED ORDER — DIAZEPAM 2 MG PO TABS: 2.0000 mg | ORAL_TABLET | Freq: Three times a day (TID) | ORAL | 0 refills | Status: DC | PRN

## 2017-03-12 MED ORDER — DIAZEPAM 2 MG PO TABS
2.0000 mg | ORAL_TABLET | Freq: Once | ORAL | Status: AC
  Administered 2017-03-12: 2 mg via ORAL
  Filled 2017-03-12: qty 1

## 2017-03-12 NOTE — Discharge Instructions (Addendum)
Please take Tylenol (acetaminophen) to relieve your pain.  You may take tylenol, up to 1,000 mg (two extra strength pills).  Do not take more than 3,000 mg tylenol in a 24 hour period.  Please check all medication labels as many medications such as pain and cold medications may contain tylenol. Please do not drink alcohol while taking this medication.   Please use a heating pad on your lower back to help with the pain.    Today you received medications that may make you sleepy or impair your ability to make decisions.  For the next 24 hours please do not drive, operate heavy machinery, care for a small child with out another adult present, or perform any activities that may cause harm to you or someone else if you were to fall asleep or be impaired.   If you take too much of this medicine it can make you have difficulty breathing and cause complications.  Please take the least amount of medication possible to manage your pain.  These medications will not fully remove your pain.  These medications may be addictive so take as few as possible for as short a course as possible.   You are being prescribed a medication which may make you sleepy. Please follow up of listed precautions for at least 24 hours after taking one dose.

## 2017-03-12 NOTE — ED Provider Notes (Signed)
[facility] EMERGENCY DEPARTMENT Provider Note   CSN: [id number] Arrival date & time: 03/12/17  1329     History   Chief Complaint Chief Complaint  Patient presents with  . Back Pain    HPI [name] is a 52 y.o. female with a history of bipolar, CHF, COPD, DM 2, obesity, substance abuse, who presents today for evaluation of right-sided lower back pain.  She reports that it has been going on since Saturday.  She reports that she recently got a brace for her leg and was moving in a twisting motion to attempt to "throw" herself onto the bed and started having lower back pain on the right side.  She reports that she felt a pop.  She denies any changes to bowel or bladder function, no numbness or tingling in bilateral lower extremities or genitals.  No hematuria, dysuria, or increased urgency/frequency.  She does have a personal history of cervical cancer.   She reports that it hurt her too much to get out of bed and she has difficulty walking.    HPI  Past Medical History:  Diagnosis Date  . Arthritis 04-10-11   hips, shoulders, back  . Asthma   . Bipolar disorder ([place])   . Cancer ([place]) 1993   cervical, no treatment done, went away per pt  . Cervical dysplasia or atypia 04-10-11   '93- once dx.-got pregnant-no intervention, then postpartum, no dysplasia found  . CHF (congestive heart failure) ([place])    no cardiologist 2014 dx, none now  . 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 Pathology showed Condyloma, gluteal CONDYLOMA ACUMINATUM.   [name] Kitchen COPD (chronic obstructive pulmonary disease) ([place])   . Depression   . Diabetes mellitus without complication ([place])   . Dyspnea    with actity - better since taking Singular  . Fall 12/16/2016  . Fibrocystic breast disease   . GERD (gastroesophageal reflux disease)   . Hypercalcemia   . Hyperlipidemia   . Hypertension   . Migraine headache    none recent  . Morbid obesity ([place]) 03/27/2006    . Neuropathy   . Skin lesion 03/15/2011   In gluteal crease now s/p removal by Dr. [name] of General Surgery on 3/12. Path shows condyloma.     . Sleep apnea 04-10-11   uses cpap, pt does not know settings  . Trigger finger    left third    Patient Active Problem List   Diagnosis Date Noted  . Chronic kidney disease (CKD), stage IV (severe) ([place]) 01/14/2017  . Fracture of fifth toe, left, open, initial encounter 12/18/2016  . Type 2 diabetes mellitus with stage 3 chronic kidney disease, with long-term current use of insulin ([place]) 12/12/2016  . Substance abuse ([place]) 11/01/2013  . Chronic diastolic CHF (congestive heart failure) ([place]) 04/07/2012  . GERD 01/26/2010  . Hypertension 08/04/2008  . OBSTRUCTIVE SLEEP APNEA 02/03/2008  . FIBROCYSTIC BREAST DISEASE 10/28/2006  . Hyperlipidemia 03/27/2006  . Morbid obesity ([place]) 03/27/2006  . TOBACCO DEPENDENCE 03/27/2006  . Depression with anxiety 03/27/2006  . NEUROGENIC BLADDER 03/27/2006    Past Surgical History:  Procedure Laterality Date  . ANAL FISTULECTOMY  04/17/2011   Procedure: FISTULECTOMY ANAL;  Surgeon: Imogene Burn. [name], MD;  [facility];  Service: General;  Laterality: N/A;  Excision of Condyloma Gluteal Cleft   . BACK SURGERY  04-10-11   x5-Lumbar fusion-retained hardware.(Dr. Louanne Skye)  . BREAST CYST EXCISION  bilateral breast, 3 cysts removed from each breast  . BREAST EXCISIONAL BIOPSY Right    x 3  . BREAST EXCISIONAL BIOPSY Left    x 3  . CARPAL TUNNEL RELEASE Bilateral   . Cervical biospy    . COLONOSCOPY WITH PROPOFOL N/A 07/06/2015   Procedure: COLONOSCOPY WITH PROPOFOL;  Surgeon: [name], MD;  [facility];  Service: Endoscopy;  Laterality: N/A;  . DOPPLER ECHOCARDIOGRAPHY  04/06/2012   AT [place] 55-60%  . I&D EXTREMITY Left 12/18/2016   Procedure: IRRIGATION AND DEBRIDEMENT LEFT FOOT, CLOSURE;  Surgeon: [name], MD;  Location: [facility];  Service: Orthopedics;  Laterality: Left;   [name] Kitchen MULTIPLE TOOTH EXTRACTIONS    . NECK SURGERY  04-10-11   x3- cervical fusion with plating and screws-Dr. [name]  . TEE WITHOUT CARDIOVERSION N/A 04/06/2012   Procedure: TRANSESOPHAGEAL ECHOCARDIOGRAM (TEE);  Surgeon: [name], MD;  Location: [facility] ENDOSCOPY;  Service: Cardiovascular;  Laterality: N/A;  . TRIGGER FINGER RELEASE Right    middle finger  . TRIGGER FINGER RELEASE Left 06/28/2016   Procedure: RELEASE TRIGGER FINGER LEFT 3RD FINGER;  Surgeon: [name], MD;  Location: [facility];  Service: Orthopedics;  Laterality: Left;  . TRIGGER FINGER RELEASE Right 06/12/2016   Procedure: RIGHT INDEX FINGER TRIGGER RELEASE;  Surgeon: [name], MD;  Location: [facility];  Service: Orthopedics;  Laterality: Right;    OB History    No data available       Home Medications    Prior to Admission medications   Medication Sig Start Date End Date Taking? Authorizing Provider  ACCU-CHEK SOFTCLIX LANCETS lancets Use as instructed 01/17/17   [name], MD  acetaminophen-codeine (TYLENOL #4) 300-60 MG tablet Take 1 tablet by mouth every 4 (four) hours as needed for moderate pain. 03/07/17   Jessy Oto, MD  amLODipine (NORVASC) 10 MG tablet TAKE 1 TABLET ONCE DAILY FOR BLOOD PRESSURE. Patient taking differently: 10 mg daily. FOR BLOOD PRESSURE 12/10/16   [name], MD  atorvastatin (LIPITOR) 40 MG tablet Take 1 tablet (40 mg total) daily by mouth. 12/11/16   [name], MD  blood glucose meter kit and supplies KIT Dispense based on patient and insurance preference. Use up to four times daily as directed. (FOR ICD-9 250.00, 250.01). 12/20/16   Bonnita Hollow, MD  busPIRone (BUSPAR) 10 MG tablet Take 1 tablet (10 mg total) by mouth 3 (three) times daily. 05/03/16   Vivi Barrack, MD  citalopram (CELEXA) 20 MG tablet Take 1 tablet (20 mg total) daily by mouth. 12/10/16   [name], MD  diazepam (VALIUM) 2 MG tablet Take 1-2 tablets (2-4 mg total) by mouth every 8 (eight) hours as needed  for muscle spasms. 03/12/17   Lorin Glass, PA-C  fluticasone (FLONASE) 50 MCG/ACT nasal spray USE 2 SPRAYS EACH NOSTRIL TWICE DAILY. 02/11/17   [name], MD  gabapentin (NEURONTIN) 300 MG capsule TAKE (1) CAPSULE THREE TIMES DAILY. Patient taking differently: Take 300 mg 3 (three) times daily by mouth.  12/10/16   [name], MD  glucose blood (ACCU-CHEK AVIVA) test strip Use as instructed 12/23/16   Zenia Resides, MD  hydrochlorothiazide (HYDRODIURIL) 25 MG tablet TAKE 1 TABLET EACH DAY. Patient taking differently: Take 25 mg daily by mouth.  12/10/16   [name], MD  HYDROcodone-acetaminophen (NORCO) 7.5-325 MG tablet Take 1 tablet by mouth every 6 (six) hours as needed for moderate pain. 02/06/17  Jessy Oto, MD  hydrOXYzine (VISTARIL) 100 MG capsule Take 1 capsule (100 mg total) by mouth 3 (three) times daily as needed for itching. 03/07/17   Jessy Oto, MD  Insulin Glargine (LANTUS SOLOSTAR) 100 UNIT/ML Solostar Pen INJECT 22 UNITS IN THE MORNING 03/12/17   [name], MD  insulin lispro (HUMALOG KWIKPEN) 100 UNIT/ML KiwkPen Inject 0.03 mLs (3 Units total) into the skin 3 (three) times daily before meals. 01/19/17   [name], MD  Insulin Pen Needle 29G X 5MM MISC Check sugars TID before meals. 01/19/17   [name], MD  loratadine (CLARITIN) 10 MG tablet Take 10 mg by mouth daily.    [provider]  losartan (COZAAR) 100 MG tablet Take 1 tablet (100 mg total) daily by mouth. 12/11/16   [name], MD  montelukast (SINGULAIR) 10 MG tablet Take 1 tablet (10 mg total) by mouth at bedtime. AS DIRECTED 01/17/17   [name], MD  mupirocin ointment (BACTROBAN) 2 % Apply 1 application topically 2 (two) times daily. 12/24/16   [name], MD  NON FORMULARY Uses a C-PAP at bedtime    [provider]  nystatin (MYCOSTATIN) 100000 UNIT/ML suspension Take 15 mLs 2 (two) times daily by mouth. SWISH FOR 30 SECONDS AND SPIT    [provider]   omeprazole (PRILOSEC) 20 MG capsule Take 1 capsule (20 mg total) by mouth daily. 01/17/17   [name], MD  SPIRIVA HANDIHALER 18 MCG inhalation capsule INHALE CONTENTS OF 1 CAPSULE DAILY. 11/25/16   [name], MD  VENTOLIN HFA 108 (90 Base) MCG/ACT inhaler USE 1-2 PUFFS INTO LUNGS EVERY 4 HOURS AS NEEDED FOR WHEEZING OR SHORTNESS OF BREATH. 11/29/16   [name], MD  sucralfate (CARAFATE) 1 G tablet Take 1 tablet (1 g total) by mouth daily. 04/27/10 04/09/11  Arnetha Gula, MD    Family History Family History  Problem Relation Age of Onset  . Stroke Father   . Cancer Maternal Aunt        breast  . Asthma Other   . COPD Other   . Hypertension Other   . Diabetes Other     Social History Social History   Tobacco Use  . Smoking status: Current Every Day Smoker    Packs/day: 0.50    Years: 39.00    Pack years: 19.50    Types: Cigarettes    Start date: 01/28/1977  . Smokeless tobacco: Never Used  . Tobacco comment: Previous 1 ppd,  Quit during hospit. 12/16/2016  Substance Use Topics  . Alcohol use: No    Alcohol/week: 0.0 oz  . Drug use: Yes    Types: Marijuana, "Crack" cocaine    Comment: hx marijuana usemany years ago, Crack -none since 11/2015     Allergies   Methadone hcl; Levofloxacin; and Nicotine   Review of Systems Review of Systems  Constitutional: Negative for chills and fever.  HENT: Negative for ear pain and sore throat.   Eyes: Negative for pain and visual disturbance.  Respiratory: Negative for cough and shortness of breath.   Cardiovascular: Negative for chest pain and palpitations.  Gastrointestinal: Negative for abdominal pain and vomiting.  Genitourinary: Negative for dysuria and hematuria.  Musculoskeletal: Positive for back pain. Negative for arthralgias.  Skin: Negative for color change and rash.  Neurological: Negative for seizures, syncope, weakness and numbness.  All other systems reviewed and are negative.    Physical  Exam Updated Vital Signs BP (!) 160/97 (BP  Location: Left Arm)   Pulse 89   Temp 98.3 F (36.8 C)   Resp 20   Wt (!) 166.9 kg (368 lb)   LMP 04/10/2006   SpO2 98%   BMI 52.80 kg/m   Physical Exam  Constitutional: She is oriented to person, place, and time. She appears well-developed and well-nourished. No distress.  HENT:  Head: Normocephalic and atraumatic.  Eyes: Conjunctivae are normal. Right eye exhibits no discharge. Left eye exhibits no discharge. No scleral icterus.  Neck: Normal range of motion.  Cardiovascular: Normal rate, regular rhythm and intact distal pulses.  2+ DP/PT pulses bilaterally.  Pulmonary/Chest: Effort normal. No stridor. No respiratory distress.  Abdominal: She exhibits no distension.  Musculoskeletal: She exhibits no edema or deformity.  There is TTP diffusely over midline and right lateral lumbar back.  Palpation here both re-creates and exacerbates her pain.  5/5 strength in bilateral lower extremities.   Neurological: She is alert and oriented to person, place, and time. No sensory deficit. She exhibits normal muscle tone.  Sensation intact and symmetrical to bilateral legs.   Skin: Skin is warm and dry. She is not diaphoretic.  Psychiatric: She has a normal mood and affect. Her behavior is normal.  Nursing note and vitals reviewed.    ED Treatments / Results  Labs (all labs ordered are listed, but only abnormal results are displayed) Labs Reviewed - No data to display  EKG  EKG Interpretation None       Radiology Dg Lumbar Spine Complete  Result Date: 03/12/2017 CLINICAL DATA:  Low back pain EXAM: LUMBAR SPINE - COMPLETE 4+ VIEW COMPARISON:  Lumbar spine radiograph 01/09/2017 FINDINGS: Unchanged appearance of PLIF hardware at L3-S1. No abnormal perihardware lucency. Possible discontinuity of the proximal aspect of the left S1 transpedicular screws unchanged. No acute fracture. Alignment is unchanged. IMPRESSION: Unchanged appearance of  L3-S1 PLIF.  No acute abnormality. Electronically Signed   By: Ulyses Jarred M.D.   On: 03/12/2017 15:43    Procedures Procedures (including critical care time)  Medications Ordered in ED Medications  diazepam (VALIUM) tablet 2 mg (2 mg Oral Given 03/12/17 1652)     Initial Impression / Assessment and Plan / ED Course  I have reviewed the triage vital signs and the nursing notes.  Pertinent labs & imaging results that were available during my care of the patient were reviewed by me and considered in my medical decision making (see chart for details).  Clinical Course as of Mar 13 1739  Wed Mar 12, 2772  [id number] 52 year old female with chronic back issues and spinal surgery 8 years ago here with worsening of low back pain and spasm after getting herself into bed 4 days ago.  She has had no weakness, she did urinate in bed but this is secondary to not being able to get out of bed and time.  Her medications are helping her.  She does have prior history of dependence on pain medicine but is now currently just on Tylenol with codeine and gabapentin for pain.  She is got no specific weakness just is limited by her pain.  We will get a driver some pain control and spasm control and hopefully we get her home so she can follow-up with her primary care doctor.  [MB]    Clinical Course User Index [MB] Hayden Rasmussen, MD   Patient with back pain.  No neurological deficits and normal neuro exam.  Patient can walk but states is painful.  No  loss of bowel or bladder control.  No concern for cauda equina.  No fever, night sweats, weight loss, h/o cancer, IVDU.  RICE protocol and pain medicine indicated and discussed with patient.   Attempted to discharge patient and she complained that it hurt too much to walk.  Based on this, and as she is medicare asked Dr. Melina Copa to see the patient who recommended discharging her with very short course of low dose valium.  PMP was checked and reviewed, no recent benzo  rx.  She was informed that these medications are addicting and that she should not take them for longer than absolutely needed. She was advised that all medications have side effects and cause severe or possibly fatal complications and stated her understanding.  X-rays with out acute abnormality.  She has a spine doctor and was advised to follow up with them.    At this time there does not appear to be any evidence of an acute emergency medical condition and the patient appears stable for discharge with appropriate outpatient follow up.Diagnosis was discussed with patient who verbalizes understanding and is agreeable to discharge. Pt case discussed with and seen by Dr. Melina Copa who agrees with my plan.     Final Clinical Impressions(s) / ED Diagnoses   Final diagnoses:  Acute right-sided low back pain without sciatica    ED Discharge Orders        Ordered    diazepam (VALIUM) 2 MG tablet  Every 8 hours PRN     03/12/17 1645       Lorin Glass, [place] 03/12/17 1741    Hayden Rasmussen, MD 03/13/17 [phone]

## 2017-03-12 NOTE — ED Triage Notes (Signed)
Pt presents to the ed with complaints of right lower back pain that has been going on since Saturday after twisting to get into the bed. pt is ambulatory.

## 2017-03-14 ENCOUNTER — Ambulatory Visit (INDEPENDENT_AMBULATORY_CARE_PROVIDER_SITE_OTHER): Payer: Medicare Other

## 2017-03-14 ENCOUNTER — Ambulatory Visit (INDEPENDENT_AMBULATORY_CARE_PROVIDER_SITE_OTHER): Payer: Medicare Other | Admitting: Orthopaedic Surgery

## 2017-03-14 ENCOUNTER — Encounter (INDEPENDENT_AMBULATORY_CARE_PROVIDER_SITE_OTHER): Payer: Self-pay | Admitting: Orthopaedic Surgery

## 2017-03-14 DIAGNOSIS — S92902D Unspecified fracture of left foot, subsequent encounter for fracture with routine healing: Secondary | ICD-10-CM | POA: Diagnosis not present

## 2017-03-14 DIAGNOSIS — M545 Low back pain, unspecified: Secondary | ICD-10-CM

## 2017-03-14 DIAGNOSIS — G8929 Other chronic pain: Secondary | ICD-10-CM

## 2017-03-14 MED ORDER — ACETAMINOPHEN-CODEINE #4 300-60 MG PO TABS: 1.0000 | ORAL_TABLET | ORAL | 0 refills | Status: DC | PRN

## 2017-03-14 MED ORDER — METHOCARBAMOL 750 MG PO TABS: 750.0000 mg | ORAL_TABLET | Freq: Two times a day (BID) | ORAL | 0 refills | Status: DC | PRN

## 2017-03-14 NOTE — Progress Notes (Signed)
Office Visit Note   Patient: [name]           Date of Birth: [birth year]           MRN: [id number] Visit Date: 03/14/2017              Requested by: [name], MD [address] [place], [place] [address] PCP: [name], MD   Assessment & Plan: Visit Diagnoses:  1. Closed multiple fractures of left foot with routine healing, subsequent encounter   2. Chronic low back pain without sciatica, unspecified back pain laterality     Plan: Impression is healed foot fractures and doing well from this and low back pain.  I reviewed the x-rays from the ED which shows stable fixation.  I do not appreciate any obvious complications or deformities.  I refilled her Tylenol No. 4 and her Robaxin.  I recommend that she follow-up with Dr. Louanne Skye or Jeneen Rinks for their next available appointment.  I stressed the importance of weight loss.  Follow-up with me as needed.  Follow-Up Instructions: Return for follow up with Dr. Louanne Skye or Jeneen Rinks next available appt.   Orders:  Orders Placed This Encounter  Procedures  . XR Foot Complete Left   Meds ordered this encounter  Medications  . methocarbamol (ROBAXIN) 750 MG tablet    Sig: Take 1 tablet (750 mg total) by mouth 2 (two) times daily as needed for muscle spasms.    Dispense:  60 tablet    Refill:  0  . acetaminophen-codeine (TYLENOL #4) 300-60 MG tablet    Sig: Take 1 tablet by mouth every 4 (four) hours as needed for moderate pain.    Dispense:  30 tablet    Refill:  0      Procedures: No procedures performed   Clinical Data: No additional findings.   Subjective: Chief Complaint  Patient presents with  . Left Foot - Routine Post Op, Follow-up    Patient follows up today for her foot fractures and a new problem of worsening back pain.  Denies any radicular symptoms.  Denies any focal weakness or numbness and tingling.  Her foot is feeling really good.  Denies any pain.  She recently went to the ED for her back pain.  She is  currently taking Tylenol No. 4 and Valium    Review of Systems  Constitutional: Negative.   HENT: Negative.   Eyes: Negative.   Respiratory: Negative.   Cardiovascular: Negative.   Endocrine: Negative.   Musculoskeletal: Negative.   Neurological: Negative.   Hematological: Negative.   Psychiatric/Behavioral: Negative.   All other systems reviewed and are negative.    Objective: Vital Signs: LMP 04/10/2006   Physical Exam  Constitutional: She is oriented to person, place, and time. She appears well-developed and well-nourished.  Pulmonary/Chest: Effort normal.  Neurological: She is alert and oriented to person, place, and time.  Skin: Skin is warm. Capillary refill takes less than 2 seconds.  Psychiatric: She has a normal mood and affect. Her behavior is normal. Judgment and thought content normal.  Nursing note and vitals reviewed.   Ortho Exam Left foot exam is benign. Low back exam shows a fully healed surgical scar.  Tenderness to palpation.  Negative straight leg no focal motor or sensory deficits. Specialty Comments:  No specialty comments available.  Imaging: Xr Foot Complete Left  Result Date: 03/14/2017 Healed fractures of multiple metatarsal and phalanges    PMFS History: Patient Active Problem List  Diagnosis Date Noted  . Chronic kidney disease (CKD), stage IV (severe) ([place]) 01/14/2017  . Fracture of fifth toe, left, open, initial encounter 12/18/2016  . Type 2 diabetes mellitus with stage 3 chronic kidney disease, with long-term current use of insulin ([place]) 12/12/2016  . Substance abuse ([place]) 11/01/2013  . Chronic diastolic CHF (congestive heart failure) ([place]) 04/07/2012  . GERD 01/26/2010  . Hypertension 08/04/2008  . OBSTRUCTIVE SLEEP APNEA 02/03/2008  . FIBROCYSTIC BREAST DISEASE 10/28/2006  . Hyperlipidemia 03/27/2006  . Morbid obesity ([place]) 03/27/2006  . TOBACCO DEPENDENCE 03/27/2006  . Depression with anxiety 03/27/2006  . NEUROGENIC  BLADDER 03/27/2006   Past Medical History:  Diagnosis Date  . Arthritis 04-10-11   hips, shoulders, back  . Asthma   . Bipolar disorder ([place])   . Cancer ([place]) 1993   cervical, no treatment done, went away per pt  . Cervical dysplasia or atypia 04-10-11   '93- once dx.-got pregnant-no intervention, then postpartum, no dysplasia found  . CHF (congestive heart failure) ([place])    no cardiologist 2014 dx, none now  . 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 Pathology showed Condyloma, gluteal CONDYLOMA ACUMINATUM.   [name] Kitchen COPD (chronic obstructive pulmonary disease) ([place])   . Depression   . Diabetes mellitus without complication ([place])   . Dyspnea    with actity - better since taking Singular  . Fall 12/16/2016  . Fibrocystic breast disease   . GERD (gastroesophageal reflux disease)   . Hypercalcemia   . Hyperlipidemia   . Hypertension   . Migraine headache    none recent  . Morbid obesity ([place]) 03/27/2006  . Neuropathy   . Skin lesion 03/15/2011   In gluteal crease now s/p removal by Dr. [name] of General Surgery on 3/12. Path shows condyloma.     . Sleep apnea 04-10-11   uses cpap, pt does not know settings  . Trigger finger    left third    Family History  Problem Relation Age of Onset  . Stroke Father   . Cancer Maternal Aunt        breast  . Asthma Other   . COPD Other   . Hypertension Other   . Diabetes Other     Past Surgical History:  Procedure Laterality Date  . ANAL FISTULECTOMY  04/17/2011   Procedure: FISTULECTOMY ANAL;  Surgeon: Imogene Burn. [name], MD;  [facility];  Service: General;  Laterality: N/A;  Excision of Condyloma Gluteal Cleft   . BACK SURGERY  04-10-11   x5-Lumbar fusion-retained hardware.(Dr. Louanne Skye)  . BREAST CYST EXCISION     bilateral breast, 3 cysts removed from each breast  . BREAST EXCISIONAL BIOPSY Right    x 3  . BREAST EXCISIONAL BIOPSY Left    x 3  . CARPAL TUNNEL RELEASE Bilateral   . Cervical biospy    .  COLONOSCOPY WITH PROPOFOL N/A 07/06/2015   Procedure: COLONOSCOPY WITH PROPOFOL;  Surgeon: [name], MD;  [facility];  Service: Endoscopy;  Laterality: N/A;  . DOPPLER ECHOCARDIOGRAPHY  04/06/2012   AT [place] 55-60%  . I&D EXTREMITY Left 12/18/2016   Procedure: IRRIGATION AND DEBRIDEMENT LEFT FOOT, CLOSURE;  Surgeon: [name], MD;  Location: [facility];  Service: Orthopedics;  Laterality: Left;  [name] Kitchen MULTIPLE TOOTH EXTRACTIONS    . NECK SURGERY  04-10-11   x3- cervical fusion with plating and screws-Dr. [name]  . TEE WITHOUT CARDIOVERSION N/A 04/06/2012   Procedure:  TRANSESOPHAGEAL ECHOCARDIOGRAM (TEE);  Surgeon: [name], MD;  Location[facility] ENDOSCOPY;  Service: Cardiovascular;  Laterality: N/A;  . TRIGGER FINGER RELEASE Right    middle finger  . TRIGGER FINGER RELEASE Left 06/28/2016   Procedure: RELEASE TRIGGER FINGER LEFT 3RD FINGER;  Surgeon: [name], MD;  Location: [facility];  Service: Orthopedics;  Laterality: Left;  . TRIGGER FINGER RELEASE Right 06/12/2016   Procedure: RIGHT INDEX FINGER TRIGGER RELEASE;  Surgeon: [name], MD;  Location: [facility];  Service: Orthopedics;  Laterality: Right;   Social History   Occupational History  . Not on file  Tobacco Use  . Smoking status: Current Every Day Smoker    Packs/day: 0.50    Years: 39.00    Pack years: 19.50    Types: Cigarettes    Start date: 01/28/1977  . Smokeless tobacco: Never Used  . Tobacco comment: Previous 1 ppd,  Quit during hospit. 12/16/2016  Substance and Sexual Activity  . Alcohol use: No    Alcohol/week: 0.0 oz  . Drug use: Yes    Types: Marijuana, "Crack" cocaine    Comment: hx marijuana usemany years ago, Crack -none since 11/2015  . Sexual activity: Yes    Partners: Male

## 2017-03-17 ENCOUNTER — Ambulatory Visit: Payer: Medicare Other | Admitting: Registered"

## 2017-03-19 ENCOUNTER — Ambulatory Visit (INDEPENDENT_AMBULATORY_CARE_PROVIDER_SITE_OTHER): Payer: Medicare Other | Admitting: Surgery

## 2017-03-19 VITALS — Ht 70.0 in | Wt 368.0 lb

## 2017-03-19 DIAGNOSIS — M5416 Radiculopathy, lumbar region: Secondary | ICD-10-CM | POA: Diagnosis not present

## 2017-03-19 DIAGNOSIS — Z981 Arthrodesis status: Secondary | ICD-10-CM | POA: Diagnosis not present

## 2017-03-19 DIAGNOSIS — Z4889 Encounter for other specified surgical aftercare: Secondary | ICD-10-CM | POA: Diagnosis not present

## 2017-03-20 ENCOUNTER — Other Ambulatory Visit: Payer: Self-pay

## 2017-03-20 ENCOUNTER — Ambulatory Visit (INDEPENDENT_AMBULATORY_CARE_PROVIDER_SITE_OTHER): Payer: Medicare Other | Admitting: Podiatry

## 2017-03-20 ENCOUNTER — Encounter: Payer: Self-pay | Admitting: Podiatry

## 2017-03-20 ENCOUNTER — Encounter: Payer: Self-pay | Admitting: Student in an Organized Health Care Education/Training Program

## 2017-03-20 ENCOUNTER — Ambulatory Visit (INDEPENDENT_AMBULATORY_CARE_PROVIDER_SITE_OTHER): Payer: Medicare Other | Admitting: Student in an Organized Health Care Education/Training Program

## 2017-03-20 VITALS — BP 116/74 | HR 89 | Temp 98.6°F | Ht 70.0 in | Wt 377.2 lb

## 2017-03-20 VITALS — BP 133/90 | HR 95 | Ht 70.0 in | Wt 368.0 lb

## 2017-03-20 DIAGNOSIS — N183 Chronic kidney disease, stage 3 unspecified: Secondary | ICD-10-CM

## 2017-03-20 DIAGNOSIS — I10 Essential (primary) hypertension: Secondary | ICD-10-CM | POA: Diagnosis not present

## 2017-03-20 DIAGNOSIS — B353 Tinea pedis: Secondary | ICD-10-CM | POA: Diagnosis not present

## 2017-03-20 DIAGNOSIS — E1122 Type 2 diabetes mellitus with diabetic chronic kidney disease: Secondary | ICD-10-CM

## 2017-03-20 DIAGNOSIS — E1151 Type 2 diabetes mellitus with diabetic peripheral angiopathy without gangrene: Secondary | ICD-10-CM

## 2017-03-20 DIAGNOSIS — E7800 Pure hypercholesterolemia, unspecified: Secondary | ICD-10-CM | POA: Diagnosis not present

## 2017-03-20 DIAGNOSIS — Z794 Long term (current) use of insulin: Secondary | ICD-10-CM

## 2017-03-20 DIAGNOSIS — B351 Tinea unguium: Secondary | ICD-10-CM

## 2017-03-20 LAB — POCT GLYCOSYLATED HEMOGLOBIN (HGB A1C): Hemoglobin A1C: 7.3

## 2017-03-20 MED ORDER — AMMONIUM LACTATE 12 % EX LOTN: 1.0000 "application " | TOPICAL_LOTION | CUTANEOUS | 0 refills | Status: DC | PRN

## 2017-03-20 MED ORDER — GABAPENTIN 300 MG PO CAPS: 600.0000 mg | ORAL_CAPSULE | Freq: Two times a day (BID) | ORAL | 3 refills | Status: DC

## 2017-03-20 MED ORDER — KETOCONAZOLE 2 % EX CREA: TOPICAL_CREAM | CUTANEOUS | 0 refills | Status: DC

## 2017-03-20 NOTE — Progress Notes (Signed)
CC: chronic disease follow up  HPI: [name] is a 52 y.o. female with PMH again for diabetes, hypertension, hyperlipidemia who presents to [facility] for follow-up of chronic diseases.  HYPERTENSION Disease Monitoring: Controlled at today's visit Blood pressure range- does not check, planning on getting a machine in April  Chest pain, palpitations- denies       Dyspnea- denies Medications: Norvasc 10 mg, Losartan 100 mg, HCTZ 25 mg Compliance- 100%  Lightheadedness,Syncope- None    Edema- none  DIABETES - recently got scale and food scale, small bowls for measuring food and controlling portions better. Has been baking everything. Disease Monitoring: Hemoglobin A1c improved at 7.3 from 9.3 Blood Sugar ranges- AM fasting sugars are  89, 98, 110, 111 Polyuria/phagia/dipsia- +polydipsial, +polyuria       Visual problems- no blurry vision. Saw ophthalmology [place] [facility] Medications: Lantus 20u qAM, Novolog TID with meals, 3u Compliance- 100%  Hypoglycemic symptoms- Denies  HYPERLIPIDEMIA Disease Monitoring: See symptoms for Hypertension Medications: Atorvastatin 40 mg daily Compliance-  100% compliance Right upper quadrant pain- none   Muscle aches- none  Monitoring Labs and Parameters Last A1C:  Lab Results  Component Value Date   HGBA1C 7.3 03/20/2017    Last Lipid:     Component Value Date/Time   CHOL 246 (H) 05/03/2016 1428   HDL 56 05/03/2016 1428   LDLDIRECT 114 (H) 09/15/2012 1559    Last BPs:  BP Readings from Last 3 Encounters:  03/20/17 116/74  03/20/17 133/90  03/12/17 (!) 160/97    Review of Symptoms:  See HPI for ROS.   CC, SH/smoking status, and VS noted.  Objective: BP 116/74   Pulse 89   Temp 98.6 F (37 C) (Oral)   Ht 5\' 10"  (1.778 m)   Wt (!) 377 lb 3.2 oz (171.1 kg)   LMP 04/10/2006   SpO2 99%   BMI 54.12 kg/m  GEN: NAD, alert, cooperative, and pleasant. RESPIRATORY: clear to auscultation bilaterally with no wheezes, rhonchi  or rales, good effort CV: RRR, no m/r/g, no peripheral edema GI: soft, non-tender, non-distended, no hepatosplenomegaly SKIN: warm and dry, no rashes or lesions NEURO: II-XII grossly intact, normal gait PSYCH: AAOx3, appropriate affect  Assessment and plan:  Type 2 diabetes mellitus with stage 3 chronic kidney disease, with long-term current use of insulin (HCC) Hemoglobin A1c is improved on today's check.  We can continue her current medication regimen.  She reports that she is been taking her gabapentin 600 mg 3 times daily for diabetic neuropathy (prescribed as 300 mg 3 times daily), however this is more than what would be recommended for her creatinine clearance based on her previous lab work.  Recommend decreasing dose to 600 mg twice daily. -Plan have patient follow-up for check of BMP and can follow-up kidney function at that time.  Hypertension BP is controlled at today's visit.  No red flags on history.  Continue current medication regimen.  Hyperlipidemia Patient endorses 100% medication compliance with no red flags.  Continue atorvastatin 40 mg daily.   Orders Placed This Encounter  Procedures  . Basic metabolic panel    Standing Status:   Future    Standing Expiration Date:   03/20/2018  . HgB A1c    Meds ordered this encounter  Medications  . gabapentin (NEURONTIN) 300 MG capsule    Sig: Take 2 capsules (600 mg total) by mouth 2 (two) times daily.    Dispense:  120 capsule    Refill:  [place], MD,MS,  PGY2 03/21/2017 2:11 PM

## 2017-03-20 NOTE — Progress Notes (Signed)
7.3

## 2017-03-20 NOTE — Progress Notes (Signed)
   Subjective:    Patient ID: [name], female    DOB: Apr 20, 1965, 52 y.o.   MRN: [id number]  HPI  Chief Complaint  Patient presents with  . Diabetes    diabetic foot exam  . Nail Problem    bilateral elongated thickened toenails, very painful       Review of Systems  Musculoskeletal: Positive for arthralgias, back pain and joint swelling.  All other systems reviewed and are negative.      Objective:   Physical Exam        Assessment & Plan:

## 2017-03-20 NOTE — Patient Instructions (Addendum)
It was a pleasure seeing you today in our clinic. Today we discussed diabetes, blood pressure and cholesterol. Here is the treatment plan we have discussed and agreed upon together:  Diabetes Your diabetes is improved  Your goal is to have an A1c < 6.5 Medicine Changes: None Homework: Keep working on diet and exercise  Schedule follow up in 3 months. Our clinic's number is [phone]. Please call with questions or concerns about what we discussed today.  Be well, Dr. Burr Medico  Fasting Blood Sugar Record  Day  Date      Blood      Time:     Sugar  Day  Date      Blood      Time:     Sugar                                                                                                                                                                                                                         If a reading is high or low, then look back to what the last meal/snack was:  Write down what you last ate, how much, & what time.   Fasting Blood Sugar Record  Day  Date      Blood      Time:     Sugar  Day  Date      Blood      Time:     Sugar  If a reading is high or low, then look back to what the last meal/snack was:  Write down what you last ate, how much, & what time.

## 2017-03-21 ENCOUNTER — Encounter: Payer: Self-pay | Admitting: Student in an Organized Health Care Education/Training Program

## 2017-03-21 NOTE — Assessment & Plan Note (Signed)
Hemoglobin A1c is improved on today's check.  We can continue her current medication regimen.  She reports that she is been taking her gabapentin 600 mg 3 times daily for diabetic neuropathy (prescribed as 300 mg 3 times daily), however this is more than what would be recommended for her creatinine clearance based on her previous lab work.  Recommend decreasing dose to 600 mg twice daily. -Plan have patient follow-up for check of BMP and can follow-up kidney function at that time.

## 2017-03-21 NOTE — Assessment & Plan Note (Signed)
Patient endorses 100% medication compliance with no red flags.  Continue atorvastatin 40 mg daily.

## 2017-03-21 NOTE — Assessment & Plan Note (Signed)
BP is controlled at today's visit.  No red flags on history.  Continue current medication regimen.

## 2017-03-24 NOTE — Progress Notes (Signed)
Subjective:  Patient ID: [name], female    DOB: 08-17-1965,  MRN: [id number]  Chief Complaint  Patient presents with  . Diabetes    diabetic foot exam  . Nail Problem    bilateral elongated thickened toenails, very painful   52 y.o. female returns for diabetic foot care. Last AMBS was 125.  PCP Dr. Burr Medico - sees her today. Denies numbness and tingling in their feet. Denies cramping in legs and thighs.  Complains of very painful elongated nails to both feet.  Complains of dryness to both feet.  Past Medical History:  Diagnosis Date  . Arthritis 04-10-11   hips, shoulders, back  . Asthma   . Bipolar disorder ([place])   . Cancer ([place]) 1993   cervical, no treatment done, went away per pt  . Cervical dysplasia or atypia 04-10-11   '93- once dx.-got pregnant-no intervention, then postpartum, no dysplasia found  . CHF (congestive heart failure) ([place])    no cardiologist 2014 dx, none now  . 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 Pathology showed Condyloma, gluteal CONDYLOMA ACUMINATUM.   [name] Kitchen COPD (chronic obstructive pulmonary disease) ([place])   . Depression   . Diabetes mellitus without complication ([place])   . Dyspnea    with actity - better since taking Singular  . Fall 12/16/2016  . Fibrocystic breast disease   . GERD (gastroesophageal reflux disease)   . Hypercalcemia   . Hyperlipidemia   . Hypertension   . Migraine headache    none recent  . Morbid obesity ([place]) 03/27/2006  . Neuropathy   . Skin lesion 03/15/2011   In gluteal crease now s/p removal by Dr. [name] of General Surgery on 3/12. Path shows condyloma.     . Sleep apnea 04-10-11   uses cpap, pt does not know settings  . Trigger finger    left third   Past Surgical History:  Procedure Laterality Date  . ANAL FISTULECTOMY  04/17/2011   Procedure: FISTULECTOMY ANAL;  Surgeon: Imogene Burn. [name], MD;  Location: [facility];  Service: General;  Laterality: N/A;  Excision of Condyloma Gluteal Cleft     . BACK SURGERY  04-10-11   x5-Lumbar fusion-retained hardware.(Dr. Louanne Skye)  . BREAST CYST EXCISION     bilateral breast, 3 cysts removed from each breast  . BREAST EXCISIONAL BIOPSY Right    x 3  . BREAST EXCISIONAL BIOPSY Left    x 3  . CARPAL TUNNEL RELEASE Bilateral   . Cervical biospy    . COLONOSCOPY WITH PROPOFOL N/A 07/06/2015   Procedure: COLONOSCOPY WITH PROPOFOL;  Surgeon: [name], MD;  Location: [facility];  Service: Endoscopy;  Laterality: N/A;  . DOPPLER ECHOCARDIOGRAPHY  04/06/2012   AT [place] 55-60%  . I&D EXTREMITY Left 12/18/2016   Procedure: IRRIGATION AND DEBRIDEMENT LEFT FOOT, CLOSURE;  Surgeon: [name], MD;  Location: [facility];  Service: Orthopedics;  Laterality: Left;  [name] Kitchen MULTIPLE TOOTH EXTRACTIONS    . NECK SURGERY  04-10-11   x3- cervical fusion with plating and screws-Dr. [name]  . TEE WITHOUT CARDIOVERSION N/A 04/06/2012   Procedure: TRANSESOPHAGEAL ECHOCARDIOGRAM (TEE);  Surgeon: [name], MD;  Location: [facility];  Service: Cardiovascular;  Laterality: N/A;  . TRIGGER FINGER RELEASE Right    middle finger  . TRIGGER FINGER RELEASE Left 06/28/2016   Procedure: RELEASE TRIGGER FINGER LEFT 3RD FINGER;  Surgeon: [name], MD;  Location: [facility];  Service: Orthopedics;  Laterality:  Left;  . TRIGGER FINGER RELEASE Right 06/12/2016   Procedure: RIGHT INDEX FINGER TRIGGER RELEASE;  Surgeon: [name], MD;  Location: [facility];  Service: Orthopedics;  Laterality: Right;    Current Outpatient Medications:  .  albuterol (VENTOLIN HFA) 108 (90 Base) MCG/ACT inhaler, USE 1-2 PUFFS INTO LUNGS EVERY 4 HOURS AS NEEDED FOR WHEEZING OR SHORTNESS OF BREATH., Disp: , Rfl:  .  amLODipine (NORVASC) 10 MG tablet, TAKE 1 TABLET ONCE DAILY FOR BLOOD PRESSURE., Disp: , Rfl:  .  busPIRone (BUSPAR) 10 MG tablet, Take by mouth., Disp: , Rfl:  .  citalopram (CELEXA) 20 MG tablet, Take by mouth., Disp: , Rfl:  .  fluticasone (FLONASE) 50 MCG/ACT nasal spray, USE  2 SPRAYS EACH NOSTRIL TWICE DAILY., Disp: , Rfl:  .  Insulin Glargine (LANTUS SOLOSTAR) 100 UNIT/ML Solostar Pen, Inject into the skin., Disp: , Rfl:  .  losartan (COZAAR) 100 MG tablet, Take by mouth., Disp: , Rfl:  .  montelukast (SINGULAIR) 10 MG tablet, Take by mouth., Disp: , Rfl:  .  omeprazole (PRILOSEC) 20 MG capsule, , Disp: , Rfl:  .  tiotropium (SPIRIVA HANDIHALER) 18 MCG inhalation capsule, , Disp: , Rfl:  .  ACCU-CHEK SOFTCLIX LANCETS lancets, Use as instructed, Disp: 100 each, Rfl: 12 .  acetaminophen-codeine (TYLENOL #4) 300-60 MG tablet, Take 1 tablet by mouth every 4 (four) hours as needed for moderate pain., Disp: 30 tablet, Rfl: 0 .  acetaminophen-codeine (TYLENOL #4) 300-60 MG tablet, Take 1 tablet by mouth every 4 (four) hours as needed for moderate pain., Disp: 30 tablet, Rfl: 0 .  amLODipine (NORVASC) 10 MG tablet, TAKE 1 TABLET ONCE DAILY FOR BLOOD PRESSURE. (Patient taking differently: 10 mg daily. FOR BLOOD PRESSURE), Disp: 30 tablet, Rfl: 0 .  ammonium lactate (AMLACTIN) 12 % lotion, Apply 1 application topically as needed for dry skin., Disp: 400 g, Rfl: 0 .  atorvastatin (LIPITOR) 40 MG tablet, Take 1 tablet (40 mg total) daily by mouth., Disp: 90 tablet, Rfl: 3 .  blood glucose meter kit and supplies KIT, Dispense based on patient and insurance preference. Use up to four times daily as directed. (FOR ICD-9 250.00, 250.01)., Disp: 1 each, Rfl: 0 .  busPIRone (BUSPAR) 10 MG tablet, Take 1 tablet (10 mg total) by mouth 3 (three) times daily., Disp: 90 tablet, Rfl: 11 .  diazepam (VALIUM) 2 MG tablet, Take 1-2 tablets (2-4 mg total) by mouth every 8 (eight) hours as needed for muscle spasms., Disp: 8 tablet, Rfl: 0 .  fluticasone (FLONASE) 50 MCG/ACT nasal spray, USE 2 SPRAYS EACH NOSTRIL TWICE DAILY., Disp: 16 g, Rfl: 0 .  gabapentin (NEURONTIN) 300 MG capsule, Take 2 capsules (600 mg total) by mouth 2 (two) times daily., Disp: 120 capsule, Rfl: 3 .  glucose blood  (ACCU-CHEK AVIVA) test strip, Use as instructed, Disp: 100 each, Rfl: 12 .  hydrochlorothiazide (HYDRODIURIL) 25 MG tablet, TAKE 1 TABLET EACH DAY. (Patient taking differently: Take 25 mg daily by mouth. ), Disp: 90 tablet, Rfl: 0 .  HYDROcodone-acetaminophen (NORCO) 7.5-325 MG tablet, Take 1 tablet by mouth every 6 (six) hours as needed for moderate pain., Disp: 30 tablet, Rfl: 0 .  hydrOXYzine (VISTARIL) 100 MG capsule, Take 1 capsule (100 mg total) by mouth 3 (three) times daily as needed for itching., Disp: 30 capsule, Rfl: 0 .  insulin lispro (HUMALOG KWIKPEN) 100 UNIT/ML KiwkPen, Inject 0.03 mLs (3 Units total) into the skin 3 (three) times daily before meals.,  Disp: 15 mL, Rfl: 2 .  Insulin Pen Needle 29G X 5MM MISC, Check sugars TID before meals., Disp: 90 each, Rfl: 0 .  ketoconazole (NIZORAL) 2 % cream, Apply 1 fingertip amount to each foot daily., Disp: 30 g, Rfl: 0 .  loratadine (CLARITIN) 10 MG tablet, Take 10 mg by mouth daily., Disp: , Rfl:  .  losartan (COZAAR) 100 MG tablet, Take 1 tablet (100 mg total) daily by mouth., Disp: 90 tablet, Rfl: 3 .  methocarbamol (ROBAXIN) 750 MG tablet, Take 1 tablet (750 mg total) by mouth 2 (two) times daily as needed for muscle spasms., Disp: 60 tablet, Rfl: 0 .  montelukast (SINGULAIR) 10 MG tablet, Take 1 tablet (10 mg total) by mouth at bedtime. AS DIRECTED, Disp: 90 tablet, Rfl: 3 .  mupirocin ointment (BACTROBAN) 2 %, Apply 1 application topically 2 (two) times daily., Disp: 22 g, Rfl: 0 .  NON FORMULARY, Uses a C-PAP at bedtime, Disp: , Rfl:  .  nystatin (MYCOSTATIN) 100000 UNIT/ML suspension, Take by mouth., Disp: , Rfl:  .  SPIRIVA HANDIHALER 18 MCG inhalation capsule, INHALE CONTENTS OF 1 CAPSULE DAILY., Disp: 30 capsule, Rfl: 0 .  VENTOLIN HFA 108 (90 Base) MCG/ACT inhaler, USE 1-2 PUFFS INTO LUNGS EVERY 4 HOURS AS NEEDED FOR WHEEZING OR SHORTNESS OF BREATH., Disp: 18 g, Rfl: 0  Allergies  Allergen Reactions  . Methadone Hcl Other (See  Comments)     "blacked out" in 1990's  . 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 Levofloxacin Itching  . Nicotine Rash and Other (See Comments)    Patch caused a rash    Objective:   Vitals:   03/20/17 0827  BP: 133/90  Pulse: 95   General AA&O x3. Normal mood and affect.  Vascular Dorsalis pedis pulses present 1+ bilaterally  Posterior tibial pulses absent bilaterally  Capillary refill normal to all digits. Pedal hair growth normal.  Neurologic Epicritic sensation present bilaterally. Protective sensation with 5.07 monofilament  present bilaterally. Vibratory sensation present bilaterally.  Dermatologic No open lesions. Interspaces clear of maceration.  Normal skin temperature and turgor. Hyperkeratotic lesions: None bilaterally. Nails: brittle, onychomycosis, thickening, elongation  Orthopedic: No history of amputation. MMT 5/5 in dorsiflexion, plantarflexion, inversion, and eversion. Normal lower extremity joint ROM without pain or crepitus.   Assessment & Plan:  Patient was evaluated and treated and all questions answered.  Diabetes with PAD, Onychomycosis -Educated on diabetic footcare. Diabetic risk level 0 -At risk foot care provided as below. -Due to xerosis Rx AmLactin cream  Procedure: Nail Debridement Rationale: Patient meets criteria for routine foot care due to Class B findings Type of Debridement: manual, sharp debridement. Instrumentation: Nail nipper, rotary burr. Number of Nails: 10   Tinea pedis -Rx ketoconazole.  Educated on application.  Return in about 3 months (around 06/17/2017) for Diabetic Foot Care.

## 2017-03-25 NOTE — Progress Notes (Signed)
This encounter was created in error - please disregard.

## 2017-03-26 ENCOUNTER — Ambulatory Visit
Admission: RE | Admit: 2017-03-26 | Discharge: 2017-03-26 | Disposition: A | Discharge status: Home / Self Care | Payer: Medicare Other | Source: Ambulatory Visit | Attending: Surgery | Admitting: Surgery

## 2017-03-26 ENCOUNTER — Other Ambulatory Visit: Payer: Medicare Other

## 2017-03-26 DIAGNOSIS — N183 Chronic kidney disease, stage 3 unspecified: Secondary | ICD-10-CM

## 2017-03-26 DIAGNOSIS — Z794 Long term (current) use of insulin: Secondary | ICD-10-CM | POA: Diagnosis not present

## 2017-03-26 DIAGNOSIS — S3992XA Unspecified injury of lower back, initial encounter: Secondary | ICD-10-CM | POA: Diagnosis not present

## 2017-03-26 DIAGNOSIS — M545 Low back pain: Secondary | ICD-10-CM | POA: Diagnosis not present

## 2017-03-26 DIAGNOSIS — E1122 Type 2 diabetes mellitus with diabetic chronic kidney disease: Secondary | ICD-10-CM | POA: Diagnosis not present

## 2017-03-26 DIAGNOSIS — Z4889 Encounter for other specified surgical aftercare: Secondary | ICD-10-CM

## 2017-03-27 ENCOUNTER — Telehealth (INDEPENDENT_AMBULATORY_CARE_PROVIDER_SITE_OTHER): Payer: Self-pay | Admitting: Specialist

## 2017-03-27 LAB — BASIC METABOLIC PANEL
BUN / CREAT RATIO: 7 — AB (ref 9–23)
BUN: 10 mg/dL (ref 6–24)
CALCIUM: 10 mg/dL (ref 8.7–10.2)
CHLORIDE: 100 mmol/L (ref 96–106)
CO2: 27 mmol/L (ref 20–29)
CREATININE: 1.42 mg/dL — AB (ref 0.57–1.00)
GFR calc non Af Amer: 42 mL/min/{1.73_m2} — ABNORMAL LOW (ref >59)
GFR, EST AFRICAN AMERICAN: 49 mL/min/{1.73_m2} — AB (ref >59)
Glucose: 87 mg/dL (ref 65–99)
Potassium: 4.5 mmol/L (ref 3.5–5.2)
Sodium: 141 mmol/L (ref 134–144)

## 2017-03-27 NOTE — Telephone Encounter (Signed)
Patient called asking for a refill on Tylenol 4's and also wanted to let Dr. Louanne [name] know she is waking up at night with severe charlie horses in her calf. CB # [phone]

## 2017-03-27 NOTE — Telephone Encounter (Signed)
Please advise. Thanks.  

## 2017-03-28 ENCOUNTER — Other Ambulatory Visit (INDEPENDENT_AMBULATORY_CARE_PROVIDER_SITE_OTHER): Payer: Self-pay | Admitting: Specialist

## 2017-03-28 ENCOUNTER — Other Ambulatory Visit: Payer: Self-pay | Admitting: Student in an Organized Health Care Education/Training Program

## 2017-03-28 ENCOUNTER — Ambulatory Visit (INDEPENDENT_AMBULATORY_CARE_PROVIDER_SITE_OTHER): Payer: Medicare Other | Admitting: Orthopedic Surgery

## 2017-03-28 DIAGNOSIS — N183 Chronic kidney disease, stage 3 unspecified: Secondary | ICD-10-CM

## 2017-03-28 MED ORDER — ACETAMINOPHEN-CODEINE #4 300-60 MG PO TABS: 1.0000 | ORAL_TABLET | ORAL | 0 refills | Status: DC | PRN

## 2017-03-28 NOTE — Telephone Encounter (Signed)
Called rx to [facility] at pt request.  I advised on the [place] water also

## 2017-03-28 NOTE — Telephone Encounter (Signed)
For leg cramping I recommend Dr. Sharol Given remedy of drinking one half to one glass of coconut milk daily. The nutrient and mineral replacement usually is beneficial in stopping leg cramps. jen Tylenol #4 Rx filled need to take other meds first and use narcotics only if ice, advil or alleve is not helping. Codiene is a narcotic and not a solution to pain. jen

## 2017-03-28 NOTE — Progress Notes (Signed)
Called and spoke with patient regarding stage III CKD. She is agreeable to nephrology referral. She is not currently taking NSAIDs, and I do not see any other nephrotoxic meds on her list. Will place referral.

## 2017-04-09 ENCOUNTER — Other Ambulatory Visit (INDEPENDENT_AMBULATORY_CARE_PROVIDER_SITE_OTHER): Payer: Self-pay | Admitting: Specialist

## 2017-04-09 ENCOUNTER — Other Ambulatory Visit: Payer: Self-pay | Admitting: *Deleted

## 2017-04-09 NOTE — Telephone Encounter (Signed)
Patient calling to request refill of insulin pen needles.  States box comes with #100, but she needs quantity increased to #120 since she uses insulin QID.  Will route request to PCP.  Burna Forts, BSN, RN-BC

## 2017-04-09 NOTE — Telephone Encounter (Signed)
Patient called needing Rx refilled Tylenol 4. Patient also advised she need a Rx for a muscle relaxer. The insurance will not cover the Methocarbamol 75 mg.  The number to contact patient is [phone]

## 2017-04-10 ENCOUNTER — Ambulatory Visit (INDEPENDENT_AMBULATORY_CARE_PROVIDER_SITE_OTHER): Payer: Medicare Other | Admitting: Specialist

## 2017-04-10 MED ORDER — INSULIN PEN NEEDLE 29G X 5MM MISC: 0 refills | Status: DC

## 2017-04-11 ENCOUNTER — Other Ambulatory Visit: Payer: Self-pay

## 2017-04-11 ENCOUNTER — Other Ambulatory Visit (INDEPENDENT_AMBULATORY_CARE_PROVIDER_SITE_OTHER): Payer: Self-pay | Admitting: Specialist

## 2017-04-11 ENCOUNTER — Telehealth (INDEPENDENT_AMBULATORY_CARE_PROVIDER_SITE_OTHER): Payer: Self-pay | Admitting: Specialist

## 2017-04-11 MED ORDER — HYDROCHLOROTHIAZIDE 25 MG PO TABS: ORAL_TABLET | ORAL | 0 refills | Status: DC

## 2017-04-11 MED ORDER — BACLOFEN 10 MG PO TABS: 10.0000 mg | ORAL_TABLET | Freq: Three times a day (TID) | ORAL | 0 refills | Status: DC

## 2017-04-11 MED ORDER — ACETAMINOPHEN-CODEINE #4 300-60 MG PO TABS: 1.0000 | ORAL_TABLET | ORAL | 0 refills | Status: DC | PRN

## 2017-04-11 NOTE — Telephone Encounter (Signed)
[facility]---I tried to call pt but vm was not setup.

## 2017-04-11 NOTE — Telephone Encounter (Signed)
[facility]----Tried to call but VM has not been setup

## 2017-04-11 NOTE — Telephone Encounter (Signed)
Patient left message on nurse line requesting [facility]. Danley Danker, RN [facility] RN)

## 2017-04-11 NOTE — Telephone Encounter (Signed)
Patient called asked for a call back as soon as possible concerning message left for medication refill. Patient advised she is hurting really bad. The number to contact patient is [phone]

## 2017-04-14 ENCOUNTER — Other Ambulatory Visit (INDEPENDENT_AMBULATORY_CARE_PROVIDER_SITE_OTHER): Payer: Self-pay | Admitting: Orthopaedic Surgery

## 2017-04-15 ENCOUNTER — Other Ambulatory Visit: Payer: Self-pay

## 2017-04-15 DIAGNOSIS — J302 Other seasonal allergic rhinitis: Secondary | ICD-10-CM

## 2017-04-16 MED ORDER — FLUTICASONE PROPIONATE 50 MCG/ACT NA SUSP: NASAL | 5 refills | Status: DC

## 2017-04-17 ENCOUNTER — Encounter (INDEPENDENT_AMBULATORY_CARE_PROVIDER_SITE_OTHER): Payer: Self-pay | Admitting: Specialist

## 2017-04-17 ENCOUNTER — Ambulatory Visit (INDEPENDENT_AMBULATORY_CARE_PROVIDER_SITE_OTHER): Payer: Medicare Other | Admitting: Specialist

## 2017-04-17 VITALS — BP 133/81 | HR 94 | Ht 70.0 in | Wt 378.0 lb

## 2017-04-17 DIAGNOSIS — Z981 Arthrodesis status: Secondary | ICD-10-CM | POA: Diagnosis not present

## 2017-04-17 DIAGNOSIS — M533 Sacrococcygeal disorders, not elsewhere classified: Secondary | ICD-10-CM | POA: Diagnosis not present

## 2017-04-17 MED ORDER — ACETAMINOPHEN-CODEINE #4 300-60 MG PO TABS: 1.0000 | ORAL_TABLET | ORAL | 0 refills | Status: DC | PRN

## 2017-04-17 NOTE — Progress Notes (Signed)
Office Visit Note   Patient: [name]           Date of Birth: Oct 01, 1965           MRN: [id number] Visit Date: 04/17/2017              Requested by: [name], MD [address] [place], [place] [address] PCP: [name], MD   Assessment & Plan: Visit Diagnoses: No diagnosis found.  Plan: Avoid bending, stooping and avoid lifting weights greater than 10 lbs. Avoid prolong standing and walking. Avoid frequent bending and stooping  No lifting greater than 10 lbs. May use ice or moist heat for pain. Weight loss is of benefit. Handicap license is approved. Bone scan of the pelvis and lumbar spine to assess for stress fracture vs degeneration at either area above or below  L2-S1 fusion.   Recommend stationary bicycle or pool walking exercises. Based on the findings of a bone scan may consider blocks of the SI joints with local anesthetic to determine if the SI joints are the cause of the persisting severe Back and upper buttock pain.  Tylenol #4 for pain.   Follow-Up Instructions: No follow-ups on file.   Orders:  No orders of the defined types were placed in this encounter.  No orders of the defined types were placed in this encounter.     Procedures: No procedures performed   Clinical Data: No additional findings.   Subjective: Chief Complaint  Patient presents with  . Lower Back - Follow-up    CT scan Review    52 year old female with history of lumbar fusion L2-3, L3-4, L4-5 and L5-S1. She is having severe pain in the buttocks. She reports that she is experiencing incontinence due to a deep Sleep. Pain she describes as severe. Report of the CT scan of her lumbar spine suggests solid fusions L2 to S1, no significant canal stenosis.   Review of Systems  Constitutional: Negative.   HENT: Negative.   Eyes: Negative.   Respiratory: Negative.   Cardiovascular: Negative.   Gastrointestinal: Negative.   Endocrine: Negative.   Genitourinary: Negative.    Musculoskeletal: Negative.   Skin: Negative.   Allergic/Immunologic: Negative.   Neurological: Negative.   Hematological: Negative.   Psychiatric/Behavioral: Negative.      Objective: Vital Signs: BP 133/81 (BP Location: Left Arm, Patient Position: Sitting)   Pulse 94   Ht 5\' 10"  (1.778 m)   Wt (!) 378 lb (171.5 kg)   LMP 04/10/2006   BMI 54.24 kg/m   Physical Exam  Constitutional: She is oriented to person, place, and time. She appears well-developed and well-nourished.  HENT:  Head: Normocephalic and atraumatic.  Eyes: Pupils are equal, round, and reactive to light. EOM are normal.  Neck: Normal range of motion. Neck supple.  Pulmonary/Chest: Effort normal and breath sounds normal.  Abdominal: Soft. Bowel sounds are normal.  Neurological: She is alert and oriented to person, place, and time.  Skin: Skin is warm and dry.  Psychiatric: She has a normal mood and affect. Her behavior is normal. Judgment and thought content normal.    Back Exam   Tenderness  The patient is experiencing tenderness in the lumbar.  Range of Motion  Extension: abnormal  Flexion: abnormal  Lateral bend right: abnormal  Lateral bend left: abnormal  Rotation right: abnormal  Rotation left: abnormal   Muscle Strength  Right Quadriceps:  5/5  Left Quadriceps:  5/5  Right Hamstrings:  5/5  Left Hamstrings:  5/5   Tests  Straight leg raise right: negative Straight leg raise left: negative  Reflexes  Patellar: abnormal Achilles: abnormal  Other  Toe walk: normal Heel walk: normal Sensation: normal Gait: normal  Erythema: no back redness Scars: present      Specialty Comments:  No specialty comments available.  Imaging: No results found.   PMFS History: Patient Active Problem List   Diagnosis Date Noted  . Chronic kidney disease (CKD), stage IV (severe) ([place]) 01/14/2017  . Fracture of fifth toe, left, open, initial encounter 12/18/2016  . Type 2 diabetes mellitus  with stage 3 chronic kidney disease, with long-term current use of insulin ([place]) 12/12/2016  . Substance abuse ([place]) 11/01/2013  . Chronic diastolic CHF (congestive heart failure) ([place]) 04/07/2012  . GERD 01/26/2010  . Hypertension 08/04/2008  . OBSTRUCTIVE SLEEP APNEA 02/03/2008  . FIBROCYSTIC BREAST DISEASE 10/28/2006  . Hyperlipidemia 03/27/2006  . Morbid obesity ([place]) 03/27/2006  . TOBACCO DEPENDENCE 03/27/2006  . Depression with anxiety 03/27/2006  . NEUROGENIC BLADDER 03/27/2006   Past Medical History:  Diagnosis Date  . Arthritis 04-10-11   hips, shoulders, back  . Asthma   . Bipolar disorder ([place])   . Cancer ([place]) 1993   cervical, no treatment done, went away per pt  . Cervical dysplasia or atypia 04-10-11   '93- once dx.-got pregnant-no intervention, then postpartum, no dysplasia found  . CHF (congestive heart failure) ([place])    no cardiologist 2014 dx, none now  . 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 Pathology showed Condyloma, gluteal CONDYLOMA ACUMINATUM.   [name] Kitchen COPD (chronic obstructive pulmonary disease) ([place])   . Depression   . Diabetes mellitus without complication ([place])   . Dyspnea    with actity - better since taking Singular  . Fall 12/16/2016  . Fibrocystic breast disease   . GERD (gastroesophageal reflux disease)   . Hypercalcemia   . Hyperlipidemia   . Hypertension   . Migraine headache    none recent  . Morbid obesity ([place]) 03/27/2006  . Neuropathy   . Skin lesion 03/15/2011   In gluteal crease now s/p removal by Dr. [name] of General Surgery on 3/12. Path shows condyloma.     . Sleep apnea 04-10-11   uses cpap, pt does not know settings  . Trigger finger    left third    Family History  Problem Relation Age of Onset  . Stroke Father   . Cancer Maternal Aunt        breast  . Asthma Other   . COPD Other   . Hypertension Other   . Diabetes Other     Past Surgical History:  Procedure Laterality Date  . ANAL  FISTULECTOMY  04/17/2011   Procedure: FISTULECTOMY ANAL;  Surgeon: Imogene Burn. [name], MD;  Location: [facility];  Service: General;  Laterality: N/A;  Excision of Condyloma Gluteal Cleft   . BACK SURGERY  04-10-11   x5-Lumbar fusion-retained hardware.(Dr. Louanne Skye)  . BREAST CYST EXCISION     bilateral breast, 3 cysts removed from each breast  . BREAST EXCISIONAL BIOPSY Right    x 3  . BREAST EXCISIONAL BIOPSY Left    x 3  . CARPAL TUNNEL RELEASE Bilateral   . Cervical biospy    . COLONOSCOPY WITH PROPOFOL N/A 07/06/2015   Procedure: COLONOSCOPY WITH PROPOFOL;  Surgeon: [name], MD;  Location: [facility];  Service: Endoscopy;  Laterality: N/A;  . DOPPLER ECHOCARDIOGRAPHY  04/06/2012   AT [place] 55-60%  . I&D EXTREMITY Left 12/18/2016   Procedure: IRRIGATION AND DEBRIDEMENT LEFT FOOT, CLOSURE;  Surgeon: [name], MD;  Location: [facility];  Service: Orthopedics;  Laterality: Left;  [name] Kitchen MULTIPLE TOOTH EXTRACTIONS    . NECK SURGERY  04-10-11   x3- cervical fusion with plating and screws-Dr. [name]  . TEE WITHOUT CARDIOVERSION N/A 04/06/2012   Procedure: TRANSESOPHAGEAL ECHOCARDIOGRAM (TEE);  Surgeon: [name], MD;  Location: [facility] ENDOSCOPY;  Service: Cardiovascular;  Laterality: N/A;  . TRIGGER FINGER RELEASE Right    middle finger  . TRIGGER FINGER RELEASE Left 06/28/2016   Procedure: RELEASE TRIGGER FINGER LEFT 3RD FINGER;  Surgeon: [name], MD;  Location: [facility];  Service: Orthopedics;  Laterality: Left;  . TRIGGER FINGER RELEASE Right 06/12/2016   Procedure: RIGHT INDEX FINGER TRIGGER RELEASE;  Surgeon: [name], MD;  Location: [facility];  Service: Orthopedics;  Laterality: Right;   Social History   Occupational History  . Not on file  Tobacco Use  . Smoking status: Current Every Day Smoker    Packs/day: 0.50    Years: 39.00    Pack years: 19.50    Types: Cigarettes    Start date: 01/28/1977  . Smokeless tobacco: Never Used  . Tobacco comment: Previous 1 ppd,  Quit  during hospit. 12/16/2016  Substance and Sexual Activity  . Alcohol use: No    Alcohol/week: 0.0 oz  . Drug use: Yes    Types: Marijuana, "Crack" cocaine    Comment: hx marijuana usemany years ago, Crack -none since 11/2015  . Sexual activity: Yes    Partners: Male

## 2017-04-17 NOTE — Patient Instructions (Addendum)
Avoid bending, stooping and avoid lifting weights greater than 10 lbs. Avoid prolong standing and walking. Avoid frequent bending and stooping  No lifting greater than 10 lbs. May use ice or moist heat for pain. Weight loss is of benefit. Handicap license is approved. Bone scan of the pelvis and lumbar spine to assess for stress fracture vs degeneration at either area above or below  L2-S1 fusion.   Recommend stationary bicycle or pool walking exercises. Based on the findings of a bone scan may consider blocks of the SI joints with local anesthetic to determine if the SI joints are the cause of the persisting severe Back and upper buttock pain.  Tylenol #4 for pain.

## 2017-04-21 ENCOUNTER — Encounter: Payer: Self-pay | Admitting: Student in an Organized Health Care Education/Training Program

## 2017-04-22 ENCOUNTER — Encounter (HOSPITAL_COMMUNITY): Payer: Medicare Other

## 2017-04-22 ENCOUNTER — Encounter (HOSPITAL_COMMUNITY)
Admission: RE | Admit: 2017-04-22 | Discharge: 2017-04-22 | Disposition: A | Discharge status: Home / Self Care | Payer: Medicare Other | Source: Ambulatory Visit | Attending: Specialist | Admitting: Specialist

## 2017-04-22 DIAGNOSIS — Z981 Arthrodesis status: Secondary | ICD-10-CM

## 2017-04-22 DIAGNOSIS — M533 Sacrococcygeal disorders, not elsewhere classified: Secondary | ICD-10-CM

## 2017-04-22 MED ORDER — TECHNETIUM TC 99M MEDRONATE IV KIT
25.0000 | PACK | Freq: Once | INTRAVENOUS | Status: AC | PRN
  Administered 2017-04-22: 25 via INTRAVENOUS

## 2017-04-23 ENCOUNTER — Encounter: Payer: Self-pay | Admitting: Student in an Organized Health Care Education/Training Program

## 2017-04-23 ENCOUNTER — Other Ambulatory Visit: Payer: Self-pay

## 2017-04-23 ENCOUNTER — Ambulatory Visit (INDEPENDENT_AMBULATORY_CARE_PROVIDER_SITE_OTHER): Payer: Medicare Other | Admitting: Student in an Organized Health Care Education/Training Program

## 2017-04-23 DIAGNOSIS — R4689 Other symptoms and signs involving appearance and behavior: Secondary | ICD-10-CM

## 2017-04-23 DIAGNOSIS — R600 Localized edema: Secondary | ICD-10-CM | POA: Diagnosis not present

## 2017-04-23 NOTE — Progress Notes (Signed)
 Subjective:    [name] - 52 y.o. female MRN [id number]  Date of birth: 10/05/1965  HPI  Ariam I Bramhall is here for LLE edema.  Edema Left side became swollen. This has been this way for years. She did not think much of it until she was seen by a home nurse PA. She does have some shortness of breath which has been worse lately as she may be coming down with something. She is working on quitting cigarettes. No calf tenderness. No prolonged periods of immobility or recent travel. She does have CKD IV but has not gotten in with renal yet.  Morbid Obesity Patient interested in gastric bypass surgery, going to an information session.  Unusual behavior There are moments where she has a "spell" and states she needs a moment stops talking for a few seconds before going back to behaving normally. I have not seen her do this in the past. She does not seem to have any acute anxieties, her BP is WNL and her blood sugar was about 150 this AM. She reports she may be coming down with a cold, unsure if this is contributing to her behavior. She also has a history of polysubstance abuse, though not showing other signs of acute intoxication.  Tobacco -  reports that she has been smoking cigarettes.  She started smoking about 40 years ago. She has a 19.50 pack-year smoking history. She has never used smokeless tobacco. - Review of Systems: Per HPI. - Past Medical History: Patient Active Problem List   Diagnosis Date Noted  . Edema of left lower extremity 04/23/2017  . Behavioral change 04/23/2017  . Chronic kidney disease (CKD), stage IV (severe) ([facility]) 01/14/2017  . Fracture of fifth toe, left, open, initial encounter 12/18/2016  . Type 2 diabetes mellitus with stage 3 chronic kidney disease, with long-term current use of insulin ([facility]) 12/12/2016  . Substance abuse ([facility]) 11/01/2013  . Chronic diastolic CHF (congestive heart failure) ([facility]) 04/07/2012  . GERD 01/26/2010  . Hypertension 08/04/2008    . OBSTRUCTIVE SLEEP APNEA 02/03/2008  . FIBROCYSTIC BREAST DISEASE 10/28/2006  . Hyperlipidemia 03/27/2006  . Morbid obesity ([facility]) 03/27/2006  . TOBACCO DEPENDENCE 03/27/2006  . Depression with anxiety 03/27/2006  . NEUROGENIC BLADDER 03/27/2006   - Medications: reviewed and updated Current Outpatient Medications  Medication Sig Dispense Refill  . ACCU-CHEK SOFTCLIX LANCETS lancets Use as instructed 100 each 12  . acetaminophen-codeine (TYLENOL #4) 300-60 MG tablet Take 1 tablet by mouth every 4 (four) hours as needed for moderate pain. 30 tablet 0  . acetaminophen-codeine (TYLENOL #4) 300-60 MG tablet Take 1 tablet by mouth every 4 (four) hours as needed for moderate pain. 30 tablet 0  . albuterol (VENTOLIN HFA) 108 (90 Base) MCG/ACT inhaler USE 1-2 PUFFS INTO LUNGS EVERY 4 HOURS AS NEEDED FOR WHEEZING OR SHORTNESS OF BREATH.    . amLODipine (NORVASC) 10 MG tablet TAKE 1 TABLET ONCE DAILY FOR BLOOD PRESSURE. (Patient taking differently: 10 mg daily. FOR BLOOD PRESSURE) 30 tablet 0  . amLODipine (NORVASC) 10 MG tablet TAKE 1 TABLET ONCE DAILY FOR BLOOD PRESSURE.    . ammonium lactate (AMLACTIN) 12 % lotion Apply 1 application topically as needed for dry skin. 400 g 0  . atorvastatin (LIPITOR) 40 MG tablet Take 1 tablet (40 mg total) daily by mouth. 90 tablet 3  . baclofen (LIORESAL) 10 MG tablet Take 1 tablet (10 mg total) by mouth 3 (three) times daily. 30 each 0  .   blood glucose meter kit and supplies KIT Dispense based on patient and insurance preference. Use up to four times daily as directed. (FOR ICD-9 250.00, 250.01). 1 each 0  . busPIRone (BUSPAR) 10 MG tablet Take 1 tablet (10 mg total) by mouth 3 (three) times daily. 90 tablet 11  . busPIRone (BUSPAR) 10 MG tablet Take by mouth.    . citalopram (CELEXA) 20 MG tablet Take by mouth.    . diazepam (VALIUM) 2 MG tablet Take 1-2 tablets (2-4 mg total) by mouth every 8 (eight) hours as needed for muscle spasms. 8 tablet 0  .  fluticasone (FLONASE) 50 MCG/ACT nasal spray USE 2 SPRAYS EACH NOSTRIL TWICE DAILY. 16 g 5  . gabapentin (NEURONTIN) 300 MG capsule Take 2 capsules (600 mg total) by mouth 2 (two) times daily. 120 capsule 3  . glucose blood (ACCU-CHEK AVIVA) test strip Use as instructed 100 each 12  . hydrochlorothiazide (HYDRODIURIL) 25 MG tablet TAKE 1 TABLET EACH DAY. 90 tablet 0  . HYDROcodone-acetaminophen (NORCO) 7.5-325 MG tablet Take 1 tablet by mouth every 6 (six) hours as needed for moderate pain. 30 tablet 0  . hydrOXYzine (VISTARIL) 100 MG capsule Take 1 capsule (100 mg total) by mouth 3 (three) times daily as needed for itching. 30 capsule 0  . Insulin Glargine (LANTUS SOLOSTAR) 100 UNIT/ML Solostar Pen Inject into the skin.    . insulin lispro (HUMALOG KWIKPEN) 100 UNIT/ML KiwkPen Inject 0.03 mLs (3 Units total) into the skin 3 (three) times daily before meals. 15 mL 2  . Insulin Pen Needle 29G X 5MM MISC Check sugars QID before meals and at bedtime. 120 each 0  . ketoconazole (NIZORAL) 2 % cream Apply 1 fingertip amount to each foot daily. 30 g 0  . loratadine (CLARITIN) 10 MG tablet Take 10 mg by mouth daily.    . losartan (COZAAR) 100 MG tablet Take 1 tablet (100 mg total) daily by mouth. 90 tablet 3  . losartan (COZAAR) 100 MG tablet Take by mouth.    . methocarbamol (ROBAXIN) 750 MG tablet TAKE 1 TABLET (750 MG TOTAL) BY MOUTH 2 (TWO) TIMES DAILY AS NEEDED FOR MUSCLE SPASMS. 60 tablet 0  . montelukast (SINGULAIR) 10 MG tablet Take 1 tablet (10 mg total) by mouth at bedtime. AS DIRECTED 90 tablet 3  . montelukast (SINGULAIR) 10 MG tablet Take by mouth.    . mupirocin ointment (BACTROBAN) 2 % Apply 1 application topically 2 (two) times daily. 22 g 0  . NON FORMULARY Uses a C-PAP at bedtime    . nystatin (MYCOSTATIN) 100000 UNIT/ML suspension Take by mouth.    . omeprazole (PRILOSEC) 20 MG capsule     . SPIRIVA HANDIHALER 18 MCG inhalation capsule INHALE CONTENTS OF 1 CAPSULE DAILY. 30 capsule 0   . tiotropium (SPIRIVA HANDIHALER) 18 MCG inhalation capsule     . VENTOLIN HFA 108 (90 Base) MCG/ACT inhaler USE 1-2 PUFFS INTO LUNGS EVERY 4 HOURS AS NEEDED FOR WHEEZING OR SHORTNESS OF BREATH. 18 g 0   No current facility-administered medications for this visit.     Review of Systems See HPI     Objective:   Physical Exam BP 118/82   Pulse 94   Temp 98.3 F (36.8 C) (Oral)   Ht 5' 10" (1.778 m)   Wt (!) 381 lb 12.8 oz (173.2 kg)   LMP 04/10/2006   SpO2 95%   BMI 54.78 kg/m  Gen: NAD, alert, cooperative with exam, well-appearing  HEENT:   NCAT, PERRL, clear conjunctiva, oropharynx clear, supple neck CV: RRR, good S1/S2, no murmur, no edema, capillary refill brisk  Resp: CTABL, no wheezes, non-labored Abd: SNTND, BS present, no guarding or organomegaly Skin: no rashes, normal turgor  Neuro: no gross deficits.  Psych: good insight, alert and oriented     Assessment & Plan:   Edema of left lower extremity Uncertain etiology. Chronic. Cardiac echo with normal EF completed 6 months ago, and edema predates the echo, so does not seem to be CHF related. Some dyspnea at baseline, and patient is morbidly obese. Chronic DVT is a possibility and we can order an echo to check for this. Patient has been referred to renal for CKD IV (has not been yet) however would not expect unilateral swelling with renal or liver disease. However, we can check CMP today given history of polysubstance abuse.  Would not expect unilateral swelling with norvasc, though this is also something to keep in mind. Other considerations for chronic LE swelling include some type of tumor or mass. She is up to date on her age appropriate cancer screening, though does qualify for a low dose lung CT for history of smoking, which we should discuss at her next visit. - VAS [place] LOWER EXTREMITY VENOUS (DVT); Future - CBC - CMP - will call to discuss lab results  Morbid obesity [facility]) Patient reports she plans to go to a  meeting/informational session regarding gastric bypass surgery. Body mass index is 54.78 kg/m. Will continue to encourage weight loss/support her as she pursues this option for surgical weight loss.  Behavioral change For the most part patient is normal and appropriate during exam. There are moments where she has a "spell" and states she needs a moment stops talking for a few seconds before going back to behaving normally. I have not seen her do this in the past. She does not seem to have any acute anxieties, her BP is WNL and her blood sugar was about 150 this AM. She reports she may be coming down with a cold, unsure if this is contributing to her behavior. She also has a history of polysubstance abuse, though not showing other signs of acute intoxication. - will call patient later this week to follow up with her blood and ultrasound test results, can check in on her at that time.   Orders Placed This Encounter  Procedures  . CBC  . Comprehensive metabolic panel    Order Specific Question:   Has the patient fasted?    Answer:   No   [name], MD,MS,  PGY2 04/23/2017 10:28 AM

## 2017-04-23 NOTE — Patient Instructions (Signed)
It was a pleasure seeing you today in our clinic. Here is the treatment plan we have discussed and agreed upon together:  We drew blood work at today's visit. I will call or send you a letter with these results. If you do not hear from me within the next week, please give our office a call.  Our clinic's number is [phone]. Please call with questions or concerns about what we discussed today.  Be well, Dr. Zechariah Bissonnette   

## 2017-04-23 NOTE — Assessment & Plan Note (Addendum)
Uncertain etiology. Chronic. Cardiac echo with normal EF completed 6 months ago, and edema predates the echo, so does not seem to be CHF related. Some dyspnea at baseline, and patient is morbidly obese. Chronic DVT is a possibility and we can order an echo to check for this. Patient has been referred to renal for CKD IV (has not been yet) however would not expect unilateral swelling with renal or liver disease. However, we can check CMP today given history of polysubstance abuse.  Would not expect unilateral swelling with norvasc, though this is also something to keep in mind. Other considerations for chronic LE swelling include some type of tumor or mass. She is up to date on her age appropriate cancer screening, though does qualify for a low dose lung CT for history of smoking, which we should discuss at her next visit. - VAS [place] LOWER EXTREMITY VENOUS (DVT); Future - CBC - CMP - will call to discuss lab results

## 2017-04-23 NOTE — Assessment & Plan Note (Signed)
Patient reports she plans to go to a meeting/informational session regarding gastric bypass surgery. Body mass index is 54.78 kg/m. Will continue to encourage weight loss/support her as she pursues this option for surgical weight loss.

## 2017-04-23 NOTE — Assessment & Plan Note (Signed)
For the most part patient is normal and appropriate during exam. There are moments where she has a "spell" and states she needs a moment stops talking for a few seconds before going back to behaving normally. I have not seen her do this in the past. She does not seem to have any acute anxieties, her BP is WNL and her blood sugar was about 150 this AM. She reports she may be coming down with a cold, unsure if this is contributing to her behavior. She also has a history of polysubstance abuse, though not showing other signs of acute intoxication. - will call patient later this week to follow up with her blood and ultrasound test results, can check in on her at that time.

## 2017-04-24 ENCOUNTER — Ambulatory Visit (HOSPITAL_COMMUNITY)
Admission: RE | Admit: 2017-04-24 | Discharge: 2017-04-24 | Disposition: A | Discharge status: Home / Self Care | Payer: Medicare Other | Source: Ambulatory Visit | Attending: Family Medicine | Admitting: Family Medicine

## 2017-04-24 DIAGNOSIS — R600 Localized edema: Secondary | ICD-10-CM | POA: Diagnosis not present

## 2017-04-24 LAB — COMPREHENSIVE METABOLIC PANEL
ALBUMIN: 4.1 g/dL (ref 3.5–5.5)
ALK PHOS: 98 IU/L (ref 39–117)
ALT: 21 IU/L (ref 0–32)
AST: 19 IU/L (ref 0–40)
Albumin/Globulin Ratio: 1.5 (ref 1.2–2.2)
BUN / CREAT RATIO: 11 (ref 9–23)
BUN: 14 mg/dL (ref 6–24)
Bilirubin Total: <0.2 mg/dL (ref 0.0–1.2)
CO2: 27 mmol/L (ref 20–29)
CREATININE: 1.29 mg/dL — AB (ref 0.57–1.00)
Calcium: 10 mg/dL (ref 8.7–10.2)
Chloride: 99 mmol/L (ref 96–106)
GFR calc Af Amer: 55 mL/min/{1.73_m2} — ABNORMAL LOW (ref >59)
GFR calc non Af Amer: 47 mL/min/{1.73_m2} — ABNORMAL LOW (ref >59)
GLOBULIN, TOTAL: 2.8 g/dL (ref 1.5–4.5)
Glucose: 91 mg/dL (ref 65–99)
Potassium: 4.5 mmol/L (ref 3.5–5.2)
SODIUM: 142 mmol/L (ref 134–144)
Total Protein: 6.9 g/dL (ref 6.0–8.5)

## 2017-04-24 LAB — CBC
HEMATOCRIT: 46.2 % (ref 34.0–46.6)
HEMOGLOBIN: 15.9 g/dL (ref 11.1–15.9)
MCH: 28.3 pg (ref 26.6–33.0)
MCHC: 34.4 g/dL (ref 31.5–35.7)
MCV: 82 fL (ref 79–97)
Platelets: 360 10*3/uL (ref 150–379)
RBC: 5.61 x10E6/uL — ABNORMAL HIGH (ref 3.77–5.28)
RDW: 13.9 % (ref 12.3–15.4)
WBC: 12.3 10*3/uL — ABNORMAL HIGH (ref 3.4–10.8)

## 2017-04-24 NOTE — Progress Notes (Signed)
*  Preliminary Results* Left lower extremity venous duplex completed. Left lower extremity is negative for deep vein thrombosis. There is no evidence of left Baker's cyst.  04/24/2017 10:31 AM  Maudry Mayhew, BS, RVT, RDCS, RDMS

## 2017-04-28 ENCOUNTER — Other Ambulatory Visit: Payer: Self-pay | Admitting: *Deleted

## 2017-04-29 ENCOUNTER — Telehealth: Payer: Self-pay

## 2017-04-29 ENCOUNTER — Telehealth (INDEPENDENT_AMBULATORY_CARE_PROVIDER_SITE_OTHER): Payer: Self-pay | Admitting: Specialist

## 2017-04-29 MED ORDER — ACETAMINOPHEN-CODEINE #4 300-60 MG PO TABS: 1.0000 | ORAL_TABLET | ORAL | 0 refills | Status: DC | PRN

## 2017-04-29 NOTE — Telephone Encounter (Signed)
Patient called asking for a refill on her Tylenol 4's. She would like for it to be sent into the [facility] on [address]. CB # [phone]

## 2017-04-29 NOTE — Addendum Note (Signed)
Addended by: Minda Ditto, Geoffery Spruce on: 04/29/2017 03:47 PM   Modules accepted: Orders

## 2017-04-29 NOTE — Telephone Encounter (Signed)
Pt requesting to speak with Dr. Burr Medico about a procedure she has coming up. Would not provide any further information. Her call back [phone] [name], RN

## 2017-04-30 ENCOUNTER — Ambulatory Visit (INDEPENDENT_AMBULATORY_CARE_PROVIDER_SITE_OTHER): Payer: Medicare Other | Admitting: Orthopedic Surgery

## 2017-04-30 ENCOUNTER — Telehealth: Payer: Self-pay

## 2017-04-30 DIAGNOSIS — M25561 Pain in right knee: Secondary | ICD-10-CM

## 2017-04-30 DIAGNOSIS — G8929 Other chronic pain: Secondary | ICD-10-CM

## 2017-04-30 MED ORDER — AMLODIPINE BESYLATE 10 MG PO TABS: 10.0000 mg | ORAL_TABLET | Freq: Every day | ORAL | 0 refills | Status: DC

## 2017-04-30 NOTE — Telephone Encounter (Signed)
I called rx to [place] [facility]

## 2017-04-30 NOTE — Telephone Encounter (Signed)
Patient just called and said pharmacy does not have medication yet.

## 2017-04-30 NOTE — Telephone Encounter (Signed)
Patient called back to let you know that [facility] has [facility].

## 2017-04-30 NOTE — Telephone Encounter (Signed)
Patient is taking Omeprazole and Tums and still having bad heartburn with a sour taste in her mouth and a sour stomach. States she sometimes has reflux in the night that makes her gag. Made appt for Monday but is there anything she can try in the meantime?  Call back is [phone]. Danley Danker, RN [facility] RN)

## 2017-05-02 ENCOUNTER — Encounter (INDEPENDENT_AMBULATORY_CARE_PROVIDER_SITE_OTHER): Payer: Self-pay | Admitting: Orthopedic Surgery

## 2017-05-02 NOTE — Telephone Encounter (Signed)
Pt informed of below.  She said she need to get with PCP to get some information for a surgery she is wanting.  She is going to write down what she is needing and will bring to Dr. Criss Rosales on Monday so he can get this information to Dr. Burr Medico.  I was going to schedule pt but first available with PCP was not until 05/30/17. Routing to PCP as well as Dr. Criss Rosales as an Jul[place]. Katharina Caper, Duard Spiewak D, [place]

## 2017-05-02 NOTE — Progress Notes (Signed)
Office Visit Note   Patient: [name]           Date of Birth: 11-06-1965           MRN: [id number] Visit Date: 04/30/2017 Requested by: [name], MD [place], [place] [address] PCP: [name], MD  Subjective: Chief Complaint  Patient presents with  . Right Knee - Pain, Weakness, Injury    HPI: Patient presents for evaluation of her right knee.  Patient sustained injury to right knee 12/16/2016 after a fall in bathroom at home.  She reports some swelling weakness giving way and occasional pain that wakes her from sleep.  MRI is reviewed.  She was in physical therapy for 2 weeks and has a brace.  She does use topical creams on the knee.              ROS: All systems reviewed are negative as they relate to the chief complaint within the history of present illness.  Patient denies  fevers or chills.   Assessment & Plan: Visit Diagnoses:  1. Chronic pain of right knee     Plan: Impression is MCL sprain with evidence of functional and anatomic competency of that medial collateral ligament on MRI scan.  Clinically the knee is not unstable to valgus stress at 0 and 30 degrees.  I think this is something that will take longer to get better.  She is a smoker.  Healing will be delayed but there is no operative indication at this time.  I will see her back as needed  Follow-Up Instructions: Return if symptoms worsen or fail to improve.   Orders:  No orders of the defined types were placed in this encounter.  No orders of the defined types were placed in this encounter.     Procedures: No procedures performed   Clinical Data: No additional findings.  Objective: Vital Signs: LMP 04/10/2006   Physical Exam:   Constitutional: Patient appears well-developed HEENT:  Head: Normocephalic Eyes:EOM are normal Neck: Normal range of motion Cardiovascular: Normal rate Pulmonary/chest: Effort normal Neurologic: Patient is alert Skin: Skin is  warm Psychiatric: Patient has normal mood and affect    Ortho Exam: Orthopedic exam demonstrates increased body mass index and slightly antalgic gait to the right.  Pedal pulses palpable.  Knee range of motion is full.  There is medial sided tenderness on the right-hand side but the knee is stable to varus and valgus stress at 0 and 30 degrees.  ACL PCL intact with no posterior lateral rotatory instability.  Specialty Comments:  No specialty comments available.  Imaging: No results found.   PMFS History: Patient Active Problem List   Diagnosis Date Noted  . Edema of left lower extremity 04/23/2017  . Behavioral change 04/23/2017  . Chronic kidney disease (CKD), stage IV (severe) ([place]) 01/14/2017  . Fracture of fifth toe, left, open, initial encounter 12/18/2016  . Type 2 diabetes mellitus with stage 3 chronic kidney disease, with long-term current use of insulin ([place]) 12/12/2016  . Substance abuse ([place]) 11/01/2013  . Chronic diastolic CHF (congestive heart failure) ([place]) 04/07/2012  . GERD 01/26/2010  . Hypertension 08/04/2008  . OBSTRUCTIVE SLEEP APNEA 02/03/2008  . FIBROCYSTIC BREAST DISEASE 10/28/2006  . Hyperlipidemia 03/27/2006  . Morbid obesity ([place]) 03/27/2006  . TOBACCO DEPENDENCE 03/27/2006  . Depression with anxiety 03/27/2006  . NEUROGENIC BLADDER 03/27/2006   Past Medical History:  Diagnosis Date  . Arthritis 04-10-11   hips, shoulders, back  .  Asthma   . Bipolar disorder ([place])   . Cancer ([place]) 1993   cervical, no treatment done, went away per pt  . Cervical dysplasia or atypia 04-10-11   '93- once dx.-got pregnant-no intervention, then postpartum, no dysplasia found  . CHF (congestive heart failure) ([place])    no cardiologist 2014 dx, none now  . 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 Pathology showed Condyloma, gluteal CONDYLOMA ACUMINATUM.   [name] Kitchen COPD (chronic obstructive pulmonary disease) ([place])   . Depression   . Diabetes mellitus  without complication ([place])   . Dyspnea    with actity - better since taking Singular  . Fall 12/16/2016  . Fibrocystic breast disease   . GERD (gastroesophageal reflux disease)   . Hypercalcemia   . Hyperlipidemia   . Hypertension   . Migraine headache    none recent  . Morbid obesity ([place]) 03/27/2006  . Neuropathy   . Skin lesion 03/15/2011   In gluteal crease now s/p removal by Dr. [name] of General Surgery on 3/12. Path shows condyloma.     . Sleep apnea 04-10-11   uses cpap, pt does not know settings  . Trigger finger    left third    Family History  Problem Relation Age of Onset  . Stroke Father   . Cancer Maternal Aunt        breast  . Asthma Other   . COPD Other   . Hypertension Other   . Diabetes Other     Past Surgical History:  Procedure Laterality Date  . ANAL FISTULECTOMY  04/17/2011   Procedure: FISTULECTOMY ANAL;  Surgeon: Imogene Burn. [name], MD;  [facility]: [facility];  Service: General;  Laterality: N/A;  Excision of Condyloma Gluteal Cleft   . BACK SURGERY  04-10-11   x5-Lumbar fusion-retained hardware.(Dr. Louanne Skye)  . BREAST CYST EXCISION     bilateral breast, 3 cysts removed from each breast  . BREAST EXCISIONAL BIOPSY Right    x 3  . BREAST EXCISIONAL BIOPSY Left    x 3  . CARPAL TUNNEL RELEASE Bilateral   . Cervical biospy    . COLONOSCOPY WITH PROPOFOL N/A 07/06/2015   Procedure: COLONOSCOPY WITH PROPOFOL;  Surgeon: [name], MD;  [facility];  Service: Endoscopy;  Laterality: N/A;  . DOPPLER ECHOCARDIOGRAPHY  04/06/2012   AT [place] 55-60%  . I&D EXTREMITY Left 12/18/2016   Procedure: IRRIGATION AND DEBRIDEMENT LEFT FOOT, CLOSURE;  Surgeon: [name], MD;  Location: [facility];  Service: Orthopedics;  Laterality: Left;  [name] Kitchen MULTIPLE TOOTH EXTRACTIONS    . NECK SURGERY  04-10-11   x3- cervical fusion with plating and screws-Dr. [name]  . TEE WITHOUT CARDIOVERSION N/A 04/06/2012   Procedure: TRANSESOPHAGEAL ECHOCARDIOGRAM (TEE);  Surgeon:  [name], MD;  Location[facility]OSCOPY;  Service: Cardiovascular;  Laterality: N/A;  . TRIGGER FINGER RELEASE Right    middle finger  . TRIGGER FINGER RELEASE Left 06/28/2016   Procedure: RELEASE TRIGGER FINGER LEFT 3RD FINGER;  Surgeon: [name], MD;  Location: [place];  Service: Orthopedics;  Laterality: Left;  . TRIGGER FINGER RELEASE Right 06/12/2016   Procedure: RIGHT INDEX FINGER TRIGGER RELEASE;  Surgeon: [name], MD;  Location: [place];  Service: Orthopedics;  Laterality: Right;   Social History   Occupational History  . Not on file  Tobacco Use  . Smoking status: Current Every Day Smoker    Packs/day: 0.50    Years:  39.00    Pack years: 19.50    Types: Cigarettes    Start date: 01/28/1977  . Smokeless tobacco: Never Used  . Tobacco comment: Previous 1 ppd,  Quit during hospit. 12/16/2016  Substance and Sexual Activity  . Alcohol use: No    Alcohol/week: 0.0 oz  . Drug use: Yes    Types: Marijuana, "Crack" cocaine    Comment: hx marijuana usemany years ago, Crack -none since 11/2015  . Sexual activity: Yes    Partners: Male

## 2017-05-02 NOTE — Telephone Encounter (Signed)
No additional medications, however if at any point she feels she has chest pain or shortness of breath, these would be reasons to seek medical attention sooner.

## 2017-05-05 ENCOUNTER — Ambulatory Visit (INDEPENDENT_AMBULATORY_CARE_PROVIDER_SITE_OTHER): Payer: Medicare Other | Admitting: Family Medicine

## 2017-05-05 ENCOUNTER — Other Ambulatory Visit: Payer: Self-pay

## 2017-05-05 ENCOUNTER — Telehealth: Payer: Self-pay

## 2017-05-05 ENCOUNTER — Encounter: Payer: Self-pay | Admitting: Family Medicine

## 2017-05-05 VITALS — BP 110/70 | HR 95 | Temp 97.6°F | Wt 388.0 lb

## 2017-05-05 DIAGNOSIS — E1122 Type 2 diabetes mellitus with diabetic chronic kidney disease: Secondary | ICD-10-CM | POA: Diagnosis not present

## 2017-05-05 DIAGNOSIS — Z794 Long term (current) use of insulin: Secondary | ICD-10-CM

## 2017-05-05 DIAGNOSIS — G4733 Obstructive sleep apnea (adult) (pediatric): Secondary | ICD-10-CM | POA: Diagnosis not present

## 2017-05-05 DIAGNOSIS — N183 Chronic kidney disease, stage 3 (moderate): Secondary | ICD-10-CM

## 2017-05-05 DIAGNOSIS — K219 Gastro-esophageal reflux disease without esophagitis: Secondary | ICD-10-CM

## 2017-05-05 MED ORDER — INSULIN GLARGINE 100 UNIT/ML SOLOSTAR PEN: 20.0000 [IU] | PEN_INJECTOR | Freq: Every day | SUBCUTANEOUS | 3 refills | Status: DC

## 2017-05-05 MED ORDER — INSULIN PEN NEEDLE 29G X 5MM MISC: 0 refills | Status: DC

## 2017-05-05 NOTE — Telephone Encounter (Signed)
Pt called nurse line, states MD did not give her rx for CPAP mask. Advised patient order was faxed to [facility]

## 2017-05-05 NOTE — Patient Instructions (Signed)
It was a pleasure to see you today! Thank you for choosing [facility] for your primary care. [name] was seen for GERD. Come back to the clinic in 4wks to see Dr. Burr Medico to talke about how the lifestyle changes are working, and go to the emergency room if you have any life threatening problems.  Today we talked about how some lifestyle changes can improve GERD symptoms.  We discussed smaller meals, eliminating soda, eating some food with your large medication intake and not laying down immediately after your meals can all help out.  We also reordered some of your chronic meds/supplies.    If we did any lab work today, and the results require attention, either me or my nurse will get in touch with you. If everything is normal, you will get a letter in mail and a message via . If you don't hear from [place] in two weeks, please give [place] a call. Otherwise, we look forward to seeing you again at your next visit. If you have any questions or concerns before then, please call the clinic at [phone].  Please bring all your medications to every doctors visit  Sign up for My Chart to have easy access to your labs results, and communication with your Primary care physician.    Please check-out at the front desk before leaving the clinic.    Best,  Dr. Sherene Sires FAMILY MEDICINE RESIDENT - PGY1 05/05/2017 10:16 AM

## 2017-05-06 NOTE — Progress Notes (Addendum)
    Subjective:  [name] is a 52 y.o. female who presents to the [facility] today with a chief complaint of GERD.   Chronic substernal burning with occasional burping after eating and sometimes after taking her meds (which she doesn't eat with).  Often happens while she is laying down as well and it seems to get better if she will sit up and drink some water.  She says she is consistent with her PPI and eats ~10tums/day.  She does often driink 3-4 sodas in a day, takes her meds without food.  She has not been vomiting has had no cough with bleeding.  No radiating chest pain.  Does not think it is cardiac.  Objective:  Physical Exam: BP 110/70   Pulse 95   Temp 97.6 F (36.4 C) (Oral)   Wt (!) 388 lb (176 kg)   LMP 04/10/2006   SpO2 93%   BMI 55.67 kg/m   Gen: NAD, resting comfortably, obese CV: RRR with 2/6 murmurs appreciated Pulm: NWOB, no crackles. mild wheezes GI: Normal bowel sounds present. Soft, Nontender, Nondistended. MSK: no edema, cyanosis, or clubbing noted Skin: warm, dry Neuro: grossly normal, moves all extremities Psych: Normal affect and thought content  No results found for this or any previous visit (from the past 72 hour(s)).   Assessment/Plan:  Type 2 diabetes mellitus with stage 3 chronic kidney disease, with long-term current use of insulin (HCC) Refilled meds/needles.  No dose changes at this visit  [place] [place] Patient says she broke the mask and needs a new one.  She claims to use the CPAP very consistently at home and it helps her significantly with both sleep and to avoid somnolence during the day.  She has been more fatigued since it broke and would like it replaced as soon as possible.  Placed DME order  GERD Patient complaining of classic GERD symptoms.  We discussed lifestyle modifications in addition to her current PPI/TUMS: 1) stop soda intake 2) include some food with her medication timing 3) try to avoid laying down to  sleep after eating.  Patient is insistent that all she ever eats is pasta salad   Sherene Sires, [place] - PGY1 05/26/2017 11:21 AM

## 2017-05-06 NOTE — Assessment & Plan Note (Signed)
Refilled meds/needles.  No dose changes at this visit

## 2017-05-06 NOTE — Assessment & Plan Note (Signed)
Patient complaining of classic GERD symptoms.  We discussed lifestyle modifications in addition to her current PPI/TUMS: 1) stop soda intake 2) include some food with her medication timing 3) try to avoid laying down to sleep after eating.  Patient is insistent that all she ever eats is pasta salad

## 2017-05-06 NOTE — Assessment & Plan Note (Addendum)
Patient says she broke the mask and needs a new one.  She claims to use the CPAP very consistently at home and it helps her significantly with both sleep and to avoid somnolence during the day.  She has been more fatigued since it broke and would like it replaced as soon as possible.  Placed DME order

## 2017-05-07 ENCOUNTER — Other Ambulatory Visit (INDEPENDENT_AMBULATORY_CARE_PROVIDER_SITE_OTHER): Payer: Self-pay | Admitting: Specialist

## 2017-05-07 DIAGNOSIS — H5213 Myopia, bilateral: Secondary | ICD-10-CM | POA: Diagnosis not present

## 2017-05-07 NOTE — Telephone Encounter (Signed)
Baclofen refill Request

## 2017-05-08 ENCOUNTER — Other Ambulatory Visit: Payer: Self-pay

## 2017-05-08 NOTE — Telephone Encounter (Signed)
Called and spoke with patient who is asking for me to call her insurance company to state that she needs bariatric surgery. Called number she provided [phone] but I was unable to get past the initial options. This does not seem to be a provider line.   I am happy to fill out paperwork to explain the medical indications for surgery for this patient, however it does not seem that I will be able to provide this information over hte phone.  [name], MD PGY-2 [facility]

## 2017-05-10 MED ORDER — BUSPIRONE HCL 10 MG PO TABS: 10.0000 mg | ORAL_TABLET | Freq: Three times a day (TID) | ORAL | 11 refills | Status: DC

## 2017-05-12 ENCOUNTER — Other Ambulatory Visit (INDEPENDENT_AMBULATORY_CARE_PROVIDER_SITE_OTHER): Payer: Self-pay | Admitting: Specialist

## 2017-05-12 NOTE — Telephone Encounter (Signed)
Tylenol #4 Refill request

## 2017-05-13 NOTE — Telephone Encounter (Signed)
[facility]

## 2017-05-15 ENCOUNTER — Other Ambulatory Visit: Payer: Self-pay

## 2017-05-19 MED ORDER — ALBUTEROL SULFATE HFA 108 (90 BASE) MCG/ACT IN AERS: INHALATION_SPRAY | RESPIRATORY_TRACT | 0 refills | Status: DC

## 2017-05-19 MED ORDER — TIOTROPIUM BROMIDE MONOHYDRATE 18 MCG IN CAPS: 1.0000 | ORAL_CAPSULE | Freq: Every day | RESPIRATORY_TRACT | 0 refills | Status: DC

## 2017-05-19 NOTE — Telephone Encounter (Signed)
Verified with [facility], they never received, will place in front office to be refaxed now to different fax # [phone]. Fleeger, Salome Spotted, CMA

## 2017-05-21 ENCOUNTER — Other Ambulatory Visit: Payer: Self-pay

## 2017-05-21 DIAGNOSIS — Z794 Long term (current) use of insulin: Secondary | ICD-10-CM

## 2017-05-21 DIAGNOSIS — J302 Other seasonal allergic rhinitis: Secondary | ICD-10-CM

## 2017-05-21 DIAGNOSIS — E1122 Type 2 diabetes mellitus with diabetic chronic kidney disease: Secondary | ICD-10-CM

## 2017-05-21 DIAGNOSIS — N183 Chronic kidney disease, stage 3 (moderate): Secondary | ICD-10-CM

## 2017-05-22 ENCOUNTER — Telehealth: Payer: Self-pay | Admitting: Student in an Organized Health Care Education/Training Program

## 2017-05-22 NOTE — Telephone Encounter (Signed)
[facility] called back and said the office notes from 05/05/17 don't go into detail explaining how she needs cpap therapy and how she benefits from it. How she uses it nightly and feels refreshed and it make her feel like she's gotten good sleep etc. The office notes must explain those things in detail. Please advise

## 2017-05-22 NOTE — Telephone Encounter (Signed)
Will forward to Dr. Criss Rosales.   Will a new visit be needed?  I have printed the sleep study, it is in your box until we are able to print OVN. Fleeger, Salome Spotted, CMA

## 2017-05-22 NOTE — Telephone Encounter (Signed)
[facility] called and would like a copy of the pt's sleep study and also a copy of the appointment notes from the visit before the study.

## 2017-05-23 ENCOUNTER — Ambulatory Visit (INDEPENDENT_AMBULATORY_CARE_PROVIDER_SITE_OTHER): Payer: Medicare Other | Admitting: Specialist

## 2017-05-23 ENCOUNTER — Encounter (INDEPENDENT_AMBULATORY_CARE_PROVIDER_SITE_OTHER): Payer: Self-pay | Admitting: Specialist

## 2017-05-23 DIAGNOSIS — M47816 Spondylosis without myelopathy or radiculopathy, lumbar region: Secondary | ICD-10-CM

## 2017-05-23 DIAGNOSIS — M174 Other bilateral secondary osteoarthritis of knee: Secondary | ICD-10-CM | POA: Diagnosis not present

## 2017-05-23 DIAGNOSIS — M4326 Fusion of spine, lumbar region: Secondary | ICD-10-CM | POA: Diagnosis not present

## 2017-05-23 DIAGNOSIS — M19072 Primary osteoarthritis, left ankle and foot: Secondary | ICD-10-CM

## 2017-05-23 DIAGNOSIS — M722 Plantar fascial fibromatosis: Secondary | ICD-10-CM | POA: Diagnosis not present

## 2017-05-23 MED ORDER — DICLOFENAC SODIUM 1 % TD GEL
2.0000 g | Freq: Four times a day (QID) | TRANSDERMAL | 2 refills | Status: DC
Start: 2017-05-23 — End: 2017-09-30

## 2017-05-23 MED ORDER — ACETAMINOPHEN-CODEINE #4 300-60 MG PO TABS: 1.0000 | ORAL_TABLET | ORAL | 0 refills | Status: DC | PRN

## 2017-05-23 NOTE — Patient Instructions (Addendum)
Avoid bending, stooping and avoid lifting weights greater than 10 lbs. Avoid prolong standing and walking. Avoid frequent bending and stooping  No lifting greater than 10 lbs. May use ice or moist heat for pain. Weight loss is of benefit. Handicap license is approved. Dr. Romona Curls secretary/Assistant will call to arrange for blocks of the arthritic joints above your previous fusion L1-2 and L2-3. Will consider for RFA(radiofrequency ablation if these help) [id number] will be called to your pharmacy. Please keep your appointment for bariatric evaluation and treatment as this may be A way to improve your overall health on a long term basis.   Voltaren gel applied 3-4 times a day to the right heel. Go to the [employer] and get a shoe with adequate arch support and well padded heel. A good shoe should relieve the foot pain almost immediately.

## 2017-05-23 NOTE — Progress Notes (Addendum)
Office Visit Note   Patient: [name]           Date of Birth: 1965/07/03           MRN: [id number] Visit Date: 05/23/2017              Requested by: [name], MD [address] [place], [place] [address] PCP: [name], MD   Assessment & Plan: Visit Diagnoses:  1. Morbid (severe) obesity due to excess calories ([place])   2. Other bilateral secondary osteoarthritis of knee   3. Primary osteoarthritis of left foot   4. Plantar fasciitis of right foot   5. Spondylosis without myelopathy or radiculopathy, lumbar region   6. Fusion of spine of lumbar region     Plan: Avoid bending, stooping and avoid lifting weights greater than 10 lbs. Avoid prolong standing and walking. Avoid frequent bending and stooping  No lifting greater than 10 lbs. May use ice or moist heat for pain. Weight loss is of benefit. Handicap license is approved. Dr. Romona Curls secretary/Assistant will call to arrange for blocks of the arthritic joints above your previous fusion L1-2 and L2-3. Will consider for RFA(radiofrequency ablation if these help) [id number] will be called to your pharmacy. Please keep your appointment for bariatric evaluation and treatment as this may be A way to improve your overall health on a long term basis.   Voltaren gel applied 3-4 times a day to the right heel. Go to the [employer] and get a shoe with adequate arch support and well padded heel. A good shoe should relieve the foot pain almost immediately.Follow-Up Instructions: Return in about 1 month (around 06/20/2017).   Orders:  Orders Placed This Encounter  Procedures  . Ambulatory referral to Physical Medicine Rehab   Meds ordered this encounter  Medications  . diclofenac sodium (VOLTAREN) 1 % GEL    Sig: Apply 2 g topically 4 (four) times daily.    Dispense:  3 Tube    Refill:  2  . acetaminophen-codeine (TYLENOL #4) 300-60 MG tablet    Sig: Take 1 tablet by mouth every 4 (four) hours as needed for moderate  pain.    Dispense:  30 tablet    Refill:  0      Procedures: No procedures performed   Clinical Data: No additional findings.   Subjective: Chief Complaint  Patient presents with  . Lower Back - Follow-up    Review Bone Scan  . Right Foot - Pain    52 year old female with morbid obesity and pain with leg fatigue and neurogenic claudication   Review of Systems  Constitutional: Negative.   HENT: Negative.   Eyes: Negative.   Respiratory: Negative.   Cardiovascular: Negative.   Gastrointestinal: Negative.   Endocrine: Negative.   Genitourinary: Negative.   Musculoskeletal: Negative.   Skin: Negative.   Allergic/Immunologic: Negative.   Neurological: Negative.   Hematological: Negative.   Psychiatric/Behavioral: Negative.      Objective: Vital Signs: BP (!) 142/82 (BP Location: Left Arm, Patient Position: Sitting)   Pulse 84   Ht 5\' 10"  (1.778 m)   Wt (!) 378 lb (171.5 kg)   LMP 04/10/2006   BMI 54.24 kg/m   Physical Exam  Ortho Exam  Specialty Comments:  No specialty comments available.  Imaging: No results found.   PMFS History: Patient Active Problem List   Diagnosis Date Noted  . Edema of left lower extremity 04/23/2017  . Behavioral change 04/23/2017  .  Chronic kidney disease (CKD), stage IV (severe) ([place]) 01/14/2017  . Fracture of fifth toe, left, open, initial encounter 12/18/2016  . Type 2 diabetes mellitus with stage 3 chronic kidney disease, with long-term current use of insulin ([place]) 12/12/2016  . Substance abuse ([place]) 11/01/2013  . Chronic diastolic CHF (congestive heart failure) ([place]) 04/07/2012  . GERD 01/26/2010  . Hypertension 08/04/2008  . OBSTRUCTIVE SLEEP APNEA 02/03/2008  . FIBROCYSTIC BREAST DISEASE 10/28/2006  . Hyperlipidemia 03/27/2006  . Morbid obesity ([place]) 03/27/2006  . TOBACCO DEPENDENCE 03/27/2006  . Depression with anxiety 03/27/2006  . NEUROGENIC BLADDER 03/27/2006   Past Medical History:  Diagnosis Date    . Arthritis 04-10-11   hips, shoulders, back  . Asthma   . Bipolar disorder ([place])   . Cancer ([place]) 1993   cervical, no treatment done, went away per pt  . Cervical dysplasia or atypia 04-10-11   '93- once dx.-got pregnant-no intervention, then postpartum, no dysplasia found  . CHF (congestive heart failure) ([place])    no cardiologist 2014 dx, none now  . 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 Pathology showed Condyloma, gluteal CONDYLOMA ACUMINATUM.   [name] Kitchen COPD (chronic obstructive pulmonary disease) ([place])   . Depression   . Diabetes mellitus without complication ([place])   . Dyspnea    with actity - better since taking Singular  . Fall 12/16/2016  . Fibrocystic breast disease   . GERD (gastroesophageal reflux disease)   . Hypercalcemia   . Hyperlipidemia   . Hypertension   . Migraine headache    none recent  . Morbid obesity ([place]) 03/27/2006  . Neuropathy   . Skin lesion 03/15/2011   In gluteal crease now s/p removal by Dr. [name] of General Surgery on 3/12. Path shows condyloma.     . Sleep apnea 04-10-11   uses cpap, pt does not know settings  . Trigger finger    left third    Family History  Problem Relation Age of Onset  . Stroke Father   . Cancer Maternal Aunt        breast  . Asthma Other   . COPD Other   . Hypertension Other   . Diabetes Other     Past Surgical History:  Procedure Laterality Date  . ANAL FISTULECTOMY  04/17/2011   Procedure: FISTULECTOMY ANAL;  Surgeon: Imogene Burn. [name], MD;  [facility];  Service: General;  Laterality: N/A;  Excision of Condyloma Gluteal Cleft   . BACK SURGERY  04-10-11   x5-Lumbar fusion-retained hardware.(Dr. Louanne Skye)  . BREAST CYST EXCISION     bilateral breast, 3 cysts removed from each breast  . BREAST EXCISIONAL BIOPSY Right    x 3  . BREAST EXCISIONAL BIOPSY Left    x 3  . CARPAL TUNNEL RELEASE Bilateral   . Cervical biospy    . COLONOSCOPY WITH PROPOFOL N/A 07/06/2015   Procedure: COLONOSCOPY  WITH PROPOFOL;  Surgeon: [name], MD;  [facility];  Service: Endoscopy;  Laterality: N/A;  . DOPPLER ECHOCARDIOGRAPHY  04/06/2012   AT [place] 55-60%  . I&D EXTREMITY Left 12/18/2016   Procedure: IRRIGATION AND DEBRIDEMENT LEFT FOOT, CLOSURE;  Surgeon: [name], MD;  Location: [place];  Service: Orthopedics;  Laterality: Left;  [name] Kitchen MULTIPLE TOOTH EXTRACTIONS    . NECK SURGERY  04-10-11   x3- cervical fusion with plating and screws-Dr. [name]  . TEE WITHOUT CARDIOVERSION N/A 04/06/2012   Procedure: TRANSESOPHAGEAL ECHOCARDIOGRAM (TEE);  Surgeon: [name], MD;  Location: [facility] ENDOSCOPY;  Service: Cardiovascular;  Laterality: N/A;  . TRIGGER FINGER RELEASE Right    middle finger  . TRIGGER FINGER RELEASE Left 06/28/2016   Procedure: RELEASE TRIGGER FINGER LEFT 3RD FINGER;  Surgeon: [name], MD;  Location: [place];  Service: Orthopedics;  Laterality: Left;  . TRIGGER FINGER RELEASE Right 06/12/2016   Procedure: RIGHT INDEX FINGER TRIGGER RELEASE;  Surgeon: [name], MD;  Location: [place];  Service: Orthopedics;  Laterality: Right;   Social History   Occupational History  . Not on file  Tobacco Use  . Smoking status: Current Every Day Smoker    Packs/day: 0.50    Years: 39.00    Pack years: 19.50    Types: Cigarettes    Start date: 01/28/1977  . Smokeless tobacco: Never Used  . Tobacco comment: Previous 1 ppd,  Quit during hospit. 12/16/2016  Substance and Sexual Activity  . Alcohol use: No    Alcohol/week: 0.0 oz  . Drug use: Yes    Types: Marijuana, "Crack" cocaine    Comment: hx marijuana usemany years ago, Crack -none since 11/2015  . Sexual activity: Yes    Partners: Male

## 2017-05-26 ENCOUNTER — Other Ambulatory Visit: Payer: Self-pay

## 2017-05-26 ENCOUNTER — Telehealth: Payer: Self-pay

## 2017-05-26 DIAGNOSIS — J302 Other seasonal allergic rhinitis: Secondary | ICD-10-CM

## 2017-05-26 NOTE — Telephone Encounter (Signed)
Pt requesting rx for Nicotine Patch be sent to [facility].  Pt call back [phone] [name], RN

## 2017-05-28 ENCOUNTER — Other Ambulatory Visit: Payer: Self-pay

## 2017-05-29 ENCOUNTER — Telehealth: Payer: Self-pay

## 2017-05-29 DIAGNOSIS — H524 Presbyopia: Secondary | ICD-10-CM | POA: Diagnosis not present

## 2017-05-29 MED ORDER — MONTELUKAST SODIUM 10 MG PO TABS: 10.0000 mg | ORAL_TABLET | Freq: Every day | ORAL | 3 refills | Status: DC

## 2017-05-29 MED ORDER — BUSPIRONE HCL 10 MG PO TABS: 10.0000 mg | ORAL_TABLET | Freq: Three times a day (TID) | ORAL | 11 refills | Status: DC

## 2017-05-29 MED ORDER — ALBUTEROL SULFATE HFA 108 (90 BASE) MCG/ACT IN AERS
INHALATION_SPRAY | RESPIRATORY_TRACT | 0 refills | Status: DC
Start: 2017-05-29 — End: 2017-06-16

## 2017-05-29 MED ORDER — AMMONIUM LACTATE 12 % EX LOTN: 1.0000 "application " | TOPICAL_LOTION | CUTANEOUS | 0 refills | Status: DC | PRN

## 2017-05-29 MED ORDER — CITALOPRAM HYDROBROMIDE 20 MG PO TABS: 20.0000 mg | ORAL_TABLET | Freq: Every day | ORAL | 0 refills | Status: DC

## 2017-05-29 MED ORDER — GABAPENTIN 300 MG PO CAPS: 600.0000 mg | ORAL_CAPSULE | Freq: Two times a day (BID) | ORAL | 3 refills | Status: DC

## 2017-05-29 MED ORDER — OMEPRAZOLE 20 MG PO CPDR: 20.0000 mg | DELAYED_RELEASE_CAPSULE | Freq: Every day | ORAL | 0 refills | Status: DC | PRN

## 2017-05-29 MED ORDER — TIOTROPIUM BROMIDE MONOHYDRATE 18 MCG IN CAPS: 18.0000 ug | ORAL_CAPSULE | Freq: Every day | RESPIRATORY_TRACT | 3 refills | Status: DC

## 2017-05-29 MED ORDER — AMLODIPINE BESYLATE 10 MG PO TABS: 10.0000 mg | ORAL_TABLET | Freq: Every day | ORAL | 0 refills | Status: DC

## 2017-05-29 MED ORDER — FLUTICASONE PROPIONATE 50 MCG/ACT NA SUSP: NASAL | 5 refills | Status: DC

## 2017-05-29 MED ORDER — KETOCONAZOLE 2 % EX CREA
TOPICAL_CREAM | CUTANEOUS | 0 refills | Status: DC
Start: 2017-05-29 — End: 2017-06-25

## 2017-05-29 MED ORDER — HYDROCHLOROTHIAZIDE 25 MG PO TABS: ORAL_TABLET | ORAL | 0 refills | Status: DC

## 2017-05-29 MED ORDER — LOSARTAN POTASSIUM 100 MG PO TABS: 100.0000 mg | ORAL_TABLET | Freq: Every day | ORAL | 3 refills | Status: DC

## 2017-05-29 MED ORDER — MUPIROCIN 2 % EX OINT: 1.0000 "application " | TOPICAL_OINTMENT | Freq: Two times a day (BID) | CUTANEOUS | 0 refills | Status: DC

## 2017-05-29 MED ORDER — HYDROXYZINE PAMOATE 100 MG PO CAPS: 100.0000 mg | ORAL_CAPSULE | Freq: Three times a day (TID) | ORAL | 0 refills | Status: DC | PRN

## 2017-05-29 MED ORDER — LORATADINE 10 MG PO TABS: 10.0000 mg | ORAL_TABLET | Freq: Every day | ORAL | 0 refills | Status: DC

## 2017-05-29 MED ORDER — INSULIN LISPRO 100 UNIT/ML (KWIKPEN): 3.0000 [IU] | PEN_INJECTOR | Freq: Three times a day (TID) | SUBCUTANEOUS | 2 refills | Status: DC

## 2017-05-29 NOTE — Telephone Encounter (Signed)
Dr. Burr Medico,  [facility] is requesting clarification of the frequency of administration for the prescription Ammonium Lactate Lot 12%. Please include updated quantity for 90 days supply if necessary.  [name], CMA

## 2017-05-30 ENCOUNTER — Other Ambulatory Visit: Payer: Self-pay | Admitting: Family Medicine

## 2017-05-30 ENCOUNTER — Other Ambulatory Visit (INDEPENDENT_AMBULATORY_CARE_PROVIDER_SITE_OTHER): Payer: Self-pay | Admitting: Specialist

## 2017-06-01 ENCOUNTER — Other Ambulatory Visit (INDEPENDENT_AMBULATORY_CARE_PROVIDER_SITE_OTHER): Payer: Self-pay | Admitting: Specialist

## 2017-06-02 ENCOUNTER — Other Ambulatory Visit: Payer: Self-pay | Admitting: Student in an Organized Health Care Education/Training Program

## 2017-06-02 DIAGNOSIS — R600 Localized edema: Secondary | ICD-10-CM | POA: Diagnosis not present

## 2017-06-02 DIAGNOSIS — I129 Hypertensive chronic kidney disease with stage 1 through stage 4 chronic kidney disease, or unspecified chronic kidney disease: Secondary | ICD-10-CM | POA: Diagnosis not present

## 2017-06-02 DIAGNOSIS — N183 Chronic kidney disease, stage 3 (moderate): Secondary | ICD-10-CM | POA: Diagnosis not present

## 2017-06-02 MED ORDER — AMMONIUM LACTATE 12 % EX LOTN: 1.0000 "application " | TOPICAL_LOTION | Freq: Two times a day (BID) | CUTANEOUS | 0 refills | Status: DC | PRN

## 2017-06-02 NOTE — Telephone Encounter (Signed)
Reordered with frequency clarified.

## 2017-06-02 NOTE — Telephone Encounter (Signed)
[facility] 

## 2017-06-02 NOTE — Telephone Encounter (Signed)
Baclofen refill request 

## 2017-06-02 NOTE — Telephone Encounter (Signed)
Tylenol #4- refill request  

## 2017-06-03 ENCOUNTER — Other Ambulatory Visit: Payer: Self-pay | Admitting: Nephrology

## 2017-06-03 DIAGNOSIS — I130 Hypertensive heart and chronic kidney disease with heart failure and stage 1 through stage 4 chronic kidney disease, or unspecified chronic kidney disease: Secondary | ICD-10-CM

## 2017-06-03 DIAGNOSIS — N183 Chronic kidney disease, stage 3 unspecified: Secondary | ICD-10-CM

## 2017-06-04 ENCOUNTER — Other Ambulatory Visit: Payer: Self-pay

## 2017-06-04 MED ORDER — AMMONIUM LACTATE 12 % EX LOTN: 1.0000 "application " | TOPICAL_LOTION | Freq: Two times a day (BID) | CUTANEOUS | 0 refills | Status: DC | PRN

## 2017-06-05 NOTE — Progress Notes (Signed)
Subjective:    [name] - 52 y.o. female MRN [id number]  Date of birth: [birth year]  HPI  [name] is here for dyspnea.  1. Dyspnea  1 month worsening dyspnea and chest tightness with activity.  - Sometimes has chest tightness at rest.  - Chest tightness occurs frequently throughout the day and lasts about 5 minutes, resolves spontaneously - At baseline does have some dyspnea on exertion, however feels the distance she can walk has decreased. She now has difficulty walking from the bedroom to the bathroom.  - She has noticed worsening LE edema, L>R. This has come on over the last month, despite elevation.  She is not on lasix.  - Weight is up from previous, she weights 400 lbs today from 378 lbs on 4/26. - she has not had wheezing, cough, fevers, rhinorrhea or congestion   2. LE edema. Bil, L>R - patient has noticed worsening bilateral lower extremity edema. Her left leg is tighter and more edematous than the right. She has noticed LEFT lower extremity erythema, +calf pain, +warmth over the skin. No fevers or periods of immobility. She does have some chronic L>R leg swelling however this is worse than baseline.  -  reports that she has been smoking cigarettes.  She started smoking about 40 years ago. She has a 19.50 pack-year smoking history. She has never used smokeless tobacco. - Review of Systems: Per HPI. - Past Medical History: Patient Active Problem List   Diagnosis Date Noted  . Volume overload 06/06/2017  . Edema of left lower extremity 04/23/2017  . Chronic kidney disease (CKD), stage IV (severe) ([place]) 01/14/2017  . Fracture of fifth toe, left, open, initial encounter 12/18/2016  . Type 2 diabetes mellitus with stage 3 chronic kidney disease, with long-term current use of insulin ([place]) 12/12/2016  . Substance abuse ([place]) 11/01/2013  . Chronic diastolic CHF (congestive heart failure) ([place]) 04/07/2012  . GERD 01/26/2010  . Hypertension 08/04/2008  . OBSTRUCTIVE  SLEEP APNEA 02/03/2008  . FIBROCYSTIC BREAST DISEASE 10/28/2006  . Hyperlipidemia 03/27/2006  . Morbid obesity ([place]) 03/27/2006  . TOBACCO DEPENDENCE 03/27/2006  . Depression with anxiety 03/27/2006  . NEUROGENIC BLADDER 03/27/2006   Review of Systems See HPI     Objective:   Physical Exam BP 128/70   Pulse 97   Temp 98.6 F (37 C) (Oral)   Ht 5\' 10"  (1.778 m)   Wt (!) 400 lb (181.4 kg)   LMP 04/10/2006   SpO2 92%   BMI 57.39 kg/m  Gen: obese female appears stated age, NAD, nontoxic HEENT: NCAT, PERRL, clear conjunctiva, oropharynx clear, supple neck CV: RRR, good S1/S2, no murmur, no edema, capillary refill brisk  Resp: CTABL, no wheezes, non-labored, breath sounds distant Abd: SNTND, BS present, no guarding or organomegaly Skin: no rashes, normal turgor  Neuro: no gross deficits.  Psych: good insight, alert and oriented Right lower extremity  - 1-2+ edema to the knee, no redness warmth or calf tenderness Left lower extremity - 2-3+ edema to the knee, +redness and warmth over the skin, +calf tenderness  Cardiac Echo 05/2839: Systolic function was normal. The   estimated ejection fraction was in the range of 55% to 60%. Wall   motion was normal; there were no regional wall motion   abnormalities. Doppler parameters are consistent with abnormal   left ventricular relaxation (grade 1 diastolic dysfunction).  Assessment & Plan:   Volume overload Grossly volume overloaded with 3-4+ pitting edema and weight up  22 pounds since last visit 2 weeks ago. Not currently on lasix. Last Echo WNL. Patient does have chest tightness in the office today, likely due to acute CHF exacerbation. EKG NSR in the office. - BNP - CBC - cautiously diurese (not on chronic lasix) with 20 mg PO Lasix daily today and through the weekend - follow up Monday for weight/volume status recheck, titrate lasix at that visit - return precautions/ reasons to go to ED discussed - cardiac echo ordered for  5/16 - If she calls after hours line over the weekend, consider asking how she is diuresing and adjusting lasix over the phone if considered safe and appropriate.    Edema of left lower extremity There is a history of LLE edema in the past, however the calf is tight, erythematous and painful today. L calf measures 50.5 cm compared with 47.5 cm on the right. She did have an ultrasound on 3/27 however calf looks acutely worse today. - LLE ultrasound again - diurese as noted - follow up Monday 5/13 - if dopplers are negative, treat as chronic skin changes with topical steroid for discomfort   Orders Placed This Encounter  Procedures  . Brain natriuretic peptide  . Basic metabolic panel  . EKG 12-Lead  . EKG 12-Lead    Route chart to RN to complete scheduling    Order Specific Question:   Where should this test be performed    Answer:   [facility]  . ECHOCARDIOGRAM COMPLETE    Standing Status:   Future    Standing Expiration Date:   09/07/2018    Order Specific Question:   Where should this test be performed    Answer:   [facility]    Order Specific Question:   Perflutren DEFINITY (image enhancing agent) should be administered unless hypersensitivity or allergy exist    Answer:   Administer Perflutren    Order Specific Question:   Expected Date:    Answer:   ASAP    Meds ordered this encounter  Medications  . furosemide (LASIX) 20 MG tablet    Sig: Take 1 tablet (20 mg total) by mouth daily.    Dispense:  30 tablet    Refill:  0    [name], MD,MS,  PGY2 06/09/2017 9:17 AM

## 2017-06-06 ENCOUNTER — Encounter: Payer: Self-pay | Admitting: Student in an Organized Health Care Education/Training Program

## 2017-06-06 ENCOUNTER — Ambulatory Visit (INDEPENDENT_AMBULATORY_CARE_PROVIDER_SITE_OTHER): Payer: Medicare Other | Admitting: Student in an Organized Health Care Education/Training Program

## 2017-06-06 ENCOUNTER — Ambulatory Visit (HOSPITAL_COMMUNITY)
Admission: RE | Admit: 2017-06-06 | Discharge: 2017-06-06 | Disposition: A | Discharge status: Home / Self Care | Payer: Medicare Other | Source: Ambulatory Visit | Attending: Family Medicine | Admitting: Family Medicine

## 2017-06-06 ENCOUNTER — Other Ambulatory Visit: Payer: Self-pay

## 2017-06-06 VITALS — BP 128/70 | HR 97 | Temp 98.6°F | Ht 70.0 in | Wt >= 6400 oz

## 2017-06-06 DIAGNOSIS — Z6841 Body Mass Index (BMI) 40.0 and over, adult: Secondary | ICD-10-CM | POA: Insufficient documentation

## 2017-06-06 DIAGNOSIS — E8770 Fluid overload, unspecified: Secondary | ICD-10-CM | POA: Diagnosis not present

## 2017-06-06 DIAGNOSIS — R0789 Other chest pain: Secondary | ICD-10-CM | POA: Diagnosis not present

## 2017-06-06 DIAGNOSIS — R600 Localized edema: Secondary | ICD-10-CM

## 2017-06-06 DIAGNOSIS — E669 Obesity, unspecified: Secondary | ICD-10-CM | POA: Insufficient documentation

## 2017-06-06 DIAGNOSIS — I5032 Chronic diastolic (congestive) heart failure: Secondary | ICD-10-CM | POA: Diagnosis not present

## 2017-06-06 MED ORDER — FUROSEMIDE 20 MG PO TABS: 20.0000 mg | ORAL_TABLET | Freq: Every day | ORAL | 0 refills | Status: DC

## 2017-06-06 NOTE — Assessment & Plan Note (Addendum)
Grossly volume overloaded with 3-4+ pitting edema and weight up 22 pounds since last visit 2 weeks ago. Not currently on lasix. Last Echo WNL. Patient does have chest tightness in the office today, likely due to acute CHF exacerbation. EKG NSR in the office. - BNP - CBC - cautiously diurese (not on chronic lasix) with 20 mg PO Lasix daily today and through the weekend - follow up Monday for weight/volume status recheck, titrate lasix at that visit - return precautions/ reasons to go to ED discussed - cardiac echo ordered for 5/16 - If she calls after hours line over the weekend, consider asking how she is diuresing and adjusting lasix over the phone if considered safe and appropriate.

## 2017-06-06 NOTE — Progress Notes (Signed)
Left lower extremity venous duplex completed. Preliminary results - There is no evidence of DVT, superficial thrombosis, or Baker's cyst. Toma Copier, RVS  06/06/2017 4:19 PM

## 2017-06-06 NOTE — Patient Instructions (Signed)
It was a pleasure seeing you today in our clinic. Today we discussed your shortness of breath. Here is the treatment plan we have discussed and agreed upon together:  Please go to have your left leg ultrasound completed.  We drew blood work at today's visit. I will call or send you a letter with these results. If you do not hear from me within the next week, please give our office a call.  Please follow up in our office on Monday. Take the water pill (Lasix) through the weekend. If your chest tightness and breathing WORSENS over the weekend you can call our after hours line or come in to be seen in the emergency department.  Our clinic's number is [phone]. Please call with questions or concerns about what we discussed today.  Be well, Dr. Burr Medico

## 2017-06-06 NOTE — Assessment & Plan Note (Addendum)
There is a history of LLE edema in the past, however the calf is tight, erythematous and painful today. L calf measures 50.5 cm compared with 47.5 cm on the right. She did have an ultrasound on 3/27 however calf looks acutely worse today. - LLE ultrasound again - diurese as noted - follow up Monday 5/13 - if dopplers are negative, treat as chronic skin changes with topical steroid for discomfort

## 2017-06-07 LAB — BASIC METABOLIC PANEL
BUN / CREAT RATIO: 8 — AB (ref 9–23)
BUN: 9 mg/dL (ref 6–24)
CALCIUM: 9.6 mg/dL (ref 8.7–10.2)
CHLORIDE: 101 mmol/L (ref 96–106)
CO2: 26 mmol/L (ref 20–29)
Creatinine, Ser: 1.09 mg/dL — ABNORMAL HIGH (ref 0.57–1.00)
GFR calc Af Amer: 67 mL/min/{1.73_m2} (ref >59)
GFR calc non Af Amer: 58 mL/min/{1.73_m2} — ABNORMAL LOW (ref >59)
GLUCOSE: 103 mg/dL — AB (ref 65–99)
Potassium: 4.3 mmol/L (ref 3.5–5.2)
Sodium: 142 mmol/L (ref 134–144)

## 2017-06-07 LAB — BRAIN NATRIURETIC PEPTIDE: BNP: 46.3 pg/mL (ref 0.0–100.0)

## 2017-06-09 ENCOUNTER — Ambulatory Visit
Admission: RE | Admit: 2017-06-09 | Discharge: 2017-06-09 | Disposition: A | Discharge status: Home / Self Care | Payer: Medicare Other | Source: Ambulatory Visit | Attending: Nephrology | Admitting: Nephrology

## 2017-06-09 ENCOUNTER — Encounter: Payer: Self-pay | Admitting: Family Medicine

## 2017-06-09 ENCOUNTER — Other Ambulatory Visit: Payer: Self-pay

## 2017-06-09 ENCOUNTER — Ambulatory Visit (INDEPENDENT_AMBULATORY_CARE_PROVIDER_SITE_OTHER): Payer: Medicare Other | Admitting: Family Medicine

## 2017-06-09 VITALS — BP 128/80 | HR 110 | Temp 98.5°F | Ht 70.0 in | Wt 390.2 lb

## 2017-06-09 DIAGNOSIS — M5090 Cervical disc disorder, unspecified, unspecified cervical region: Secondary | ICD-10-CM | POA: Diagnosis not present

## 2017-06-09 DIAGNOSIS — N183 Chronic kidney disease, stage 3 unspecified: Secondary | ICD-10-CM

## 2017-06-09 DIAGNOSIS — B370 Candidal stomatitis: Secondary | ICD-10-CM | POA: Diagnosis not present

## 2017-06-09 DIAGNOSIS — R600 Localized edema: Secondary | ICD-10-CM | POA: Diagnosis not present

## 2017-06-09 DIAGNOSIS — F17200 Nicotine dependence, unspecified, uncomplicated: Secondary | ICD-10-CM | POA: Diagnosis not present

## 2017-06-09 DIAGNOSIS — I5032 Chronic diastolic (congestive) heart failure: Secondary | ICD-10-CM | POA: Diagnosis not present

## 2017-06-09 DIAGNOSIS — G834 Cauda equina syndrome: Secondary | ICD-10-CM

## 2017-06-09 DIAGNOSIS — I130 Hypertensive heart and chronic kidney disease with heart failure and stage 1 through stage 4 chronic kidney disease, or unspecified chronic kidney disease: Secondary | ICD-10-CM

## 2017-06-09 DIAGNOSIS — G4733 Obstructive sleep apnea (adult) (pediatric): Secondary | ICD-10-CM | POA: Diagnosis not present

## 2017-06-09 MED ORDER — NYSTATIN 100000 UNIT/ML MT SUSP: 5.0000 mL | Freq: Four times a day (QID) | OROMUCOSAL | 0 refills | Status: DC

## 2017-06-09 MED ORDER — NICOTINE 21 MG/24HR TD PT24: 21.0000 mg | MEDICATED_PATCH | Freq: Every day | TRANSDERMAL | 2 refills | Status: DC

## 2017-06-09 MED ORDER — NYSTATIN 100000 UNIT/ML MT SUSP: 5.0000 mL | Freq: Four times a day (QID) | OROMUCOSAL | 1 refills | Status: DC

## 2017-06-09 NOTE — Assessment & Plan Note (Signed)
Whitish, painful, multiple lesions in mouth most likely thrush secondary to dentures.  Prescribed nystatin mouth wash as patient has used in the past and stated that it has helped her a great deal.

## 2017-06-09 NOTE — Assessment & Plan Note (Signed)
Referral to Urology

## 2017-06-09 NOTE — Progress Notes (Signed)
Subjective: No chief complaint on file.    HPI: [name] is a 52 y.o. presenting to clinic today to discuss the following:  CHF Exacerbation Patient states since her visit on Friday her chest tightness has resolved. She has SOB at baseline due to COPD and Asthma but patient states it was "much worse" on Friday and today feels like it is back to her baseline. She is urinating frequently, every 2-3 hours after taking her Lasix pill. She has kept her weight diary and is down 10lbs from Friday (was at 400, now at 390lbs) but still above her baseline weight by 12lbs. She states her leg swelling is much improved. She still has some swelling, more on the left than the right.  Mouth pain Patient states she has mouth pain with white lesions on the inside of her mouth. She states she has had this in the past and a nystatin mouthwash helped her. Patient does have dentures.  Urinary Incontinence  Patient states she does not feel when she needs to be and the will suddenly urinate. She cannot control or stop it. She is urinating her clothes and in the bed at night. She states she was going to a Dealer "at [employer] and he gave her "some pills". She requests to go back.  Nicotine Dependence Patient expressed desire to stop smoking and restart nicotine patch. She states she had a  Reaction in the past and thought the nicotine patch was the cause, however, she recently has been off the patch and had a similar reaction and states the patches did help control her cravings.  ROS: Denies fever, chills, cough, chest pain, abdominal pain, nausea, vomiting, diarrhea, urinary burning or pain. Endorses SOB, increased urinary frequency, and constipation.  Health Maintenance: none    ROS noted in HPI.   Past Medical, Surgical, Social, and Family History Reviewed & Updated per EMR.   Pertinent Historical Findings include:   Social History   Tobacco Use  Smoking Status Current Every Day Smoker   . Packs/day: 0.50  . Years: 39.00  . Pack years: 19.50  . Types: Cigarettes  . Start date: 01/28/1977  Smokeless Tobacco Never Used  Tobacco Comment   Previous 1 ppd,  Quit during hospit. 12/16/2016    Objective: BP 128/80   Pulse (!) 110   Temp 98.5 F (36.9 C) (Oral)   Ht 5\' 10"  (1.778 m)   Wt (!) 390 lb 3.2 oz (177 kg)   LMP 04/10/2006   SpO2 90%   BMI 55.99 kg/m  Vitals and nursing notes reviewed  Physical Exam  Constitutional: She is oriented to person, place, and time. She appears well-developed and well-nourished.  HENT:  Head: Normocephalic and atraumatic.  Eyes: Pupils are equal, round, and reactive to light. EOM are normal.  Neck: Normal range of motion.  Cardiovascular: Normal rate, regular rhythm, normal heart sounds and intact distal pulses. Exam reveals no gallop.  No murmur heard. Pulmonary/Chest: Effort normal and breath sounds normal. No respiratory distress. She has no wheezes. She has no rales.  Abdominal: Soft. Bowel sounds are normal.  Musculoskeletal: She exhibits edema. She exhibits no tenderness.  Bilateral LE edema; Left is +2, right is trace  Neurological: She is alert and oriented to person, place, and time.  Skin: Skin is warm and dry. Capillary refill takes less than 2 seconds. No rash noted. There is erythema.  erythema and warm of the left lower extremity, calf measures 47.5cm in diameter  4cm below tibial tubercle  Vitals reviewed.    No results found for this or any previous visit (from the past 72 hour(s)).  Assessment/Plan:  Chronic diastolic CHF (congestive heart failure) Improved chest tightness, SOB, no crackles on exam with pulse ox at 90%. Patient appears stable so no chest x-ray at this time.  Cont Lasix 20mg  and get BMP today to ensure Potassium is wnl. Instructed patient to limit fluid intake to <2L and avoid foods high in salt. Patient has echo scheduled on 5/16; Follow up in one week.  NEUROGENIC BLADDER Referral to  Urology  TOBACCO DEPENDENCE Restarted Nicotine Patch 21mg   Oral thrush Whitish, painful, multiple lesions in mouth most likely thrush secondary to dentures.  Prescribed nystatin mouth wash as patient has used in the past and stated that it has helped her a great deal.  Edema of left lower extremity Improved. No longer tender. Still with +2 pitting edema, erythema, and warm to touch.   U/s was negative for DVT. Calf today measures 47.5cm on the left.  Encouraged patient to buy and use compression stockings and cont taking Lasix 20mg  daily.   PATIENT EDUCATION PROVIDED: See AVS    Diagnosis and plan along with any newly prescribed medication(s) were discussed in detail with this patient today. The patient verbalized understanding and agreed with the plan. Patient advised if symptoms worsen return to clinic or ER.   Health Maintainance:   Orders Placed This Encounter  Procedures  . Basic Metabolic Panel  . Ambulatory referral to Urology    Referral Priority:   Routine    Referral Type:   Consultation    Referral Reason:   Specialty Services Required    Requested Specialty:   Urology    Number of Visits Requested:   1    Meds ordered this encounter  Medications  . nicotine (NICODERM CQ - DOSED IN MG/24 HOURS) 21 mg/24hr patch    Sig: Place 1 patch (21 mg total) onto the skin daily.    Dispense:  28 patch    Refill:  2  . nystatin (MYCOSTATIN) 100000 UNIT/ML suspension    Sig: Take 5 mLs (500,000 Units total) by mouth 4 (four) times daily.    Dispense:  60 mL    Refill:  0  . nystatin (MYCOSTATIN) 100000 UNIT/ML suspension    Sig: Take 5 mLs (500,000 Units total) by mouth 4 (four) times daily.    Dispense:  60 mL    Refill:  [place], DO 06/09/2017, 9:33 AM PGY-1, [facility]

## 2017-06-09 NOTE — Assessment & Plan Note (Signed)
Restarted Nicotine Patch 21mg 

## 2017-06-09 NOTE — Assessment & Plan Note (Signed)
Improved. No longer tender. Still with +2 pitting edema, erythema, and warm to touch.   U/s was negative for DVT. Calf today measures 47.5cm on the left.  Encouraged patient to buy and use compression stockings and cont taking Lasix 20mg  daily.

## 2017-06-09 NOTE — Patient Instructions (Addendum)
It was great to meet you today! Thank you for letting me participate in your care!  Today, we discussed your breathing and chest tightness that is much improved today along with your improved leg swelling. Please continue taking Lasix once daily and recording your weight. We will see you back in one week. Avoid foods high in salt and watch your water intake, no more than 2L in a day. Please try to purchase some compression stocking to wear at night.  I also refilled your nystatin mouth wash and sent you a referral to Urology.  Be well, Harolyn Rutherford, DO PGY-1, [facility]

## 2017-06-09 NOTE — Assessment & Plan Note (Signed)
Improved chest tightness, SOB, no crackles on exam with pulse ox at 90%. Patient appears stable so no chest x-ray at this time.  Cont Lasix 20mg  and get BMP today to ensure Potassium is wnl. Instructed patient to limit fluid intake to <2L and avoid foods high in salt. Patient has echo scheduled on 5/16; Follow up in one week.

## 2017-06-10 ENCOUNTER — Other Ambulatory Visit: Payer: Self-pay | Admitting: Student in an Organized Health Care Education/Training Program

## 2017-06-10 ENCOUNTER — Encounter: Payer: Self-pay | Admitting: Family Medicine

## 2017-06-10 LAB — BASIC METABOLIC PANEL
BUN/Creatinine Ratio: 11 (ref 9–23)
BUN: 13 mg/dL (ref 6–24)
CHLORIDE: 98 mmol/L (ref 96–106)
CO2: 24 mmol/L (ref 20–29)
Calcium: 9.3 mg/dL (ref 8.7–10.2)
Creatinine, Ser: 1.19 mg/dL — ABNORMAL HIGH (ref 0.57–1.00)
GFR calc Af Amer: 60 mL/min/{1.73_m2} (ref >59)
GFR calc non Af Amer: 52 mL/min/{1.73_m2} — ABNORMAL LOW (ref >59)
GLUCOSE: 87 mg/dL (ref 65–99)
POTASSIUM: 3.9 mmol/L (ref 3.5–5.2)
SODIUM: 142 mmol/L (ref 134–144)

## 2017-06-10 MED ORDER — CITALOPRAM HYDROBROMIDE 20 MG PO TABS: 20.0000 mg | ORAL_TABLET | Freq: Every day | ORAL | 0 refills | Status: DC

## 2017-06-10 NOTE — Progress Notes (Signed)
Results for potassium wnl. Sending to patient.

## 2017-06-11 ENCOUNTER — Ambulatory Visit (INDEPENDENT_AMBULATORY_CARE_PROVIDER_SITE_OTHER): Payer: Medicare Other

## 2017-06-11 ENCOUNTER — Encounter (INDEPENDENT_AMBULATORY_CARE_PROVIDER_SITE_OTHER): Payer: Self-pay | Admitting: Physical Medicine and Rehabilitation

## 2017-06-11 ENCOUNTER — Ambulatory Visit (INDEPENDENT_AMBULATORY_CARE_PROVIDER_SITE_OTHER): Payer: Medicare Other | Admitting: Physical Medicine and Rehabilitation

## 2017-06-11 VITALS — BP 144/83 | HR 91

## 2017-06-11 DIAGNOSIS — G8929 Other chronic pain: Secondary | ICD-10-CM

## 2017-06-11 DIAGNOSIS — M5090 Cervical disc disorder, unspecified, unspecified cervical region: Secondary | ICD-10-CM | POA: Diagnosis not present

## 2017-06-11 DIAGNOSIS — M545 Low back pain, unspecified: Secondary | ICD-10-CM

## 2017-06-11 DIAGNOSIS — M47816 Spondylosis without myelopathy or radiculopathy, lumbar region: Secondary | ICD-10-CM

## 2017-06-11 DIAGNOSIS — G4733 Obstructive sleep apnea (adult) (pediatric): Secondary | ICD-10-CM | POA: Diagnosis not present

## 2017-06-11 MED ORDER — BUPIVACAINE HCL 0.5 % IJ SOLN
3.0000 mL | Freq: Once | INTRAMUSCULAR | Status: AC
  Administered 2017-06-11: 3 mL

## 2017-06-11 NOTE — Patient Instructions (Signed)

## 2017-06-11 NOTE — Progress Notes (Signed)
.  Numeric Pain Rating Scale and Functional Assessment Average Pain 10   In the last MONTH (on 0-10 scale) has pain interfered with the following?  1. General activity like being  able to carry out your everyday physical activities such as walking, climbing stairs, carrying groceries, or moving a chair?  Rating(9)   +Driver, -BT, -Dye Allergies.  

## 2017-06-12 ENCOUNTER — Other Ambulatory Visit: Payer: Self-pay

## 2017-06-12 ENCOUNTER — Ambulatory Visit (HOSPITAL_COMMUNITY)
Admission: RE | Admit: 2017-06-12 | Discharge: 2017-06-12 | Disposition: A | Discharge status: Home / Self Care | Payer: Medicare Other | Source: Ambulatory Visit | Attending: Family Medicine | Admitting: Family Medicine

## 2017-06-12 DIAGNOSIS — I509 Heart failure, unspecified: Secondary | ICD-10-CM | POA: Insufficient documentation

## 2017-06-12 DIAGNOSIS — E785 Hyperlipidemia, unspecified: Secondary | ICD-10-CM | POA: Insufficient documentation

## 2017-06-12 DIAGNOSIS — E8770 Fluid overload, unspecified: Secondary | ICD-10-CM | POA: Insufficient documentation

## 2017-06-12 DIAGNOSIS — R0789 Other chest pain: Secondary | ICD-10-CM | POA: Diagnosis not present

## 2017-06-12 DIAGNOSIS — R600 Localized edema: Secondary | ICD-10-CM | POA: Diagnosis not present

## 2017-06-12 DIAGNOSIS — E119 Type 2 diabetes mellitus without complications: Secondary | ICD-10-CM | POA: Diagnosis not present

## 2017-06-12 DIAGNOSIS — I110 Hypertensive heart disease with heart failure: Secondary | ICD-10-CM | POA: Insufficient documentation

## 2017-06-12 NOTE — Progress Notes (Signed)
  Echocardiogram 2D Echocardiogram has been performed.  [name] 06/12/2017, 10:44 AM

## 2017-06-12 NOTE — Progress Notes (Signed)
  Echocardiogram 2D Echocardiogram has been performed.  [name] [name] 06/12/2017, 10:44 AM

## 2017-06-16 ENCOUNTER — Ambulatory Visit (INDEPENDENT_AMBULATORY_CARE_PROVIDER_SITE_OTHER): Payer: Medicare Other | Admitting: Family Medicine

## 2017-06-16 ENCOUNTER — Other Ambulatory Visit: Payer: Self-pay

## 2017-06-16 ENCOUNTER — Encounter: Payer: Self-pay | Admitting: Family Medicine

## 2017-06-16 ENCOUNTER — Other Ambulatory Visit (INDEPENDENT_AMBULATORY_CARE_PROVIDER_SITE_OTHER): Payer: Self-pay | Admitting: Specialist

## 2017-06-16 VITALS — BP 102/64 | HR 94 | Temp 98.3°F | Wt 385.0 lb

## 2017-06-16 DIAGNOSIS — Z794 Long term (current) use of insulin: Secondary | ICD-10-CM

## 2017-06-16 DIAGNOSIS — E8770 Fluid overload, unspecified: Secondary | ICD-10-CM | POA: Diagnosis not present

## 2017-06-16 DIAGNOSIS — L739 Follicular disorder, unspecified: Secondary | ICD-10-CM | POA: Insufficient documentation

## 2017-06-16 DIAGNOSIS — R600 Localized edema: Secondary | ICD-10-CM | POA: Diagnosis not present

## 2017-06-16 DIAGNOSIS — R0789 Other chest pain: Secondary | ICD-10-CM | POA: Diagnosis not present

## 2017-06-16 DIAGNOSIS — N183 Chronic kidney disease, stage 3 (moderate): Secondary | ICD-10-CM

## 2017-06-16 DIAGNOSIS — G4733 Obstructive sleep apnea (adult) (pediatric): Secondary | ICD-10-CM

## 2017-06-16 DIAGNOSIS — I5032 Chronic diastolic (congestive) heart failure: Secondary | ICD-10-CM

## 2017-06-16 DIAGNOSIS — E1122 Type 2 diabetes mellitus with diabetic chronic kidney disease: Secondary | ICD-10-CM | POA: Diagnosis not present

## 2017-06-16 LAB — POCT GLYCOSYLATED HEMOGLOBIN (HGB A1C): Hemoglobin A1C: 7

## 2017-06-16 MED ORDER — CEPHALEXIN 500 MG PO CAPS: 500.0000 mg | ORAL_CAPSULE | Freq: Two times a day (BID) | ORAL | 0 refills | Status: AC

## 2017-06-16 MED ORDER — FUROSEMIDE 20 MG PO TABS: 20.0000 mg | ORAL_TABLET | Freq: Two times a day (BID) | ORAL | 0 refills | Status: DC

## 2017-06-16 NOTE — Assessment & Plan Note (Signed)
Ordered a new CPAP per patient request since her current CPAP has worn out.

## 2017-06-16 NOTE — Assessment & Plan Note (Signed)
Blood pressure well controlled today.  Patient seems to be responding to Lasix, but is still volume overloaded, so we will increase Lasix to 20 mg BID.  Patient should get a BMP in about 2 weeks to check on electrolytes and kidney function after increasing Lasix dose.

## 2017-06-16 NOTE — Telephone Encounter (Signed)
Called rx to [facility]

## 2017-06-16 NOTE — Assessment & Plan Note (Signed)
A1c is improved from 7.3 to 7.0 today.  Patient may benefit from increasing her Humalog or decreasing her Lantus as well as adding an SGLT2 inhibitor, but we will not make any changes today since her A1c is at goal.  We will reassess at her next visit.

## 2017-06-16 NOTE — Patient Instructions (Addendum)
It was nice meeting you today Ms. Dalto!  For your heart failure, please taking Lasix 20 mg in the morning and at night to keep your fluid and leg swelling down.  Please also continue to limit fluid intake and salt.  Try the compression stockings if you can find them at a better price at [facility].  For your diabetes, we will keep your insulin dose the same for now, but you may benefit from different medications in the future.  You are doing a great job with controlling your diabetes.  I ordered a CPAP machine for you - please take the prescription to [facility].  Please come back in two weeks to continue talking about your diabetes and heart.  If you have any questions or concerns, please feel free to call the clinic.   Be well,  Dr. Shan Levans

## 2017-06-16 NOTE — Progress Notes (Signed)
Subjective:    [name] - 52 y.o. female MRN [id number]  Date of birth: [birth year]  HPI  [name] is here for follow up of her diastolic CHF and Diabetes.  CHF - currently taking Lasix 20 mg daily; takes it at night because she feels that it helps her swelling and leg pain more than when she takes it in the morning - weight is down 5 lbs from last week's visit - leg swelling is getting worse, especially in L leg as before - shortness of breath is also worsening, and patient has had a cough over the past week - pulse ox is 95% on room air - echo from 06/12/17 shows EF of 65-70% with normal systolic function - has not yet gotten compression hose since they were too expensive at the drug store; will look at [facility]  Diabetes - blood sugars range from 80s while fasting to 280 after meals - is taking Lantus 20 units daily and Humalog 9 units TID  Health Maintenance:  Health Maintenance Due  Topic Date Due  . FOOT EXAM  07/25/1973    -  reports that she has been smoking cigarettes.  She started smoking about 40 years ago. She has a 19.50 pack-year smoking history. She has never used smokeless tobacco. - Review of Systems: Per HPI. - Past Medical History: Patient Active Problem List   Diagnosis Date Noted  . Folliculitis 07/24/9483  . Oral thrush 06/09/2017  . Volume overload 06/06/2017  . Edema of left lower extremity 04/23/2017  . Chronic kidney disease (CKD), stage IV (severe) ([place]) 01/14/2017  . Fracture of fifth toe, left, open, initial encounter 12/18/2016  . Type 2 diabetes mellitus with stage 3 chronic kidney disease, with long-term current use of insulin ([place]) 12/12/2016  . Substance abuse ([place]) 11/01/2013  . Chronic diastolic CHF (congestive heart failure) ([place]) 04/07/2012  . GERD 01/26/2010  . Hypertension 08/04/2008  . OBSTRUCTIVE SLEEP APNEA 02/03/2008  . FIBROCYSTIC BREAST DISEASE 10/28/2006  . Hyperlipidemia 03/27/2006  . Morbid obesity ([place])  03/27/2006  . TOBACCO DEPENDENCE 03/27/2006  . Depression with anxiety 03/27/2006  . NEUROGENIC BLADDER 03/27/2006   - Medications: reviewed and updated   Objective:   Physical Exam BP 102/64   Pulse 94   Temp 98.3 F (36.8 C) (Oral)   Wt (!) 385 lb (174.6 kg)   LMP 04/10/2006   SpO2 95%   BMI 55.24 kg/m  Gen: NAD, alert, cooperative with exam, morbidly obese HEENT: NCAT, clear conjunctiva, supple neck CV: RRR, good S1/S2, no murmur, 2+ pedal edema bilaterally Resp: CTABL, no wheezes, non-labored, no crackles, deep breathing produces cough Abd: SNTND, BS present, no guarding or organomegaly Skin: erythematous papule on posterior R thigh, nondraining Neuro: no gross deficits.  Psych: good insight, alert and oriented        Assessment & Plan:   Type 2 diabetes mellitus with stage 3 chronic kidney disease, with long-term current use of insulin ([facility]) A1c is improved from 7.3 to 7.0 today.  Patient may benefit from increasing her Humalog or decreasing her Lantus as well as adding an SGLT2 inhibitor, but we will not make any changes today since her A1c is at goal.  We will reassess at her next visit.   OBSTRUCTIVE SLEEP APNEA Ordered a new CPAP per patient request since her current CPAP has worn out.  Chronic diastolic CHF (congestive heart failure) Blood pressure well controlled today.  Patient seems to be responding to Lasix, but  is still volume overloaded, so we will increase Lasix to 20 mg BID.  Patient should get a BMP in about 2 weeks to check on electrolytes and kidney function after increasing Lasix dose.  Folliculitis Will prescribe Keflex 500 mg BID x 7 days.    Maia Breslow, M.D. 06/16/2017, 11:47 AM PGY-1, [place]

## 2017-06-16 NOTE — Telephone Encounter (Signed)
Tylenol #4- refill request  

## 2017-06-16 NOTE — Assessment & Plan Note (Signed)
Will prescribe Keflex 500 mg BID x 7 days.

## 2017-06-19 NOTE — Progress Notes (Deleted)
Subjective:    Patient ID: [name], female    DOB: Mar 15, 1965, 52 y.o.   MRN: [id number]  No chief complaint on file.   HPI:  SHIKITA VAILLANCOURT is a 52 y.o. female who presents today for initial evaluation and treatment for Hepatitis B infection.  Ms. Schickling was recently evaluated by her nephrologist, Dr. Carolin Sicks, and found to have a positive Hepatitis B Surface Antigen. No additional testing was completed at the time. She previous tested positive in March of 2008 and noted to have a negative Hepatitis B Core Antibody IgM indicating the likelihood of chronic inactive Hepatitis B. She has had a CT scan of her abdomen performed in August 2018 showing no focal liver abnormality.   primary routes of HBV transmission are sexual contact, percutaneous exposure to infectious body fluids (such as occurs through needle sharing by IDUs or needlestick injuries in health-care settings), perinatal exposure to an infected mother, and prolonged, close personal contact with an infected person (e.g., via contact with exudates from dermatologic lesions, contact with contaminated surfaces, or sharing toothbrushes or razors), as occurs in household contact (5,52).  Allergies  Allergen Reactions  . 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 Levofloxacin Itching  . Nicotine Rash and Other (See Comments)    Patch caused a rash      Outpatient Medications Prior to Visit  Medication Sig Dispense Refill  . ACCU-CHEK SOFTCLIX LANCETS lancets Use as instructed 100 each 12  . acetaminophen-codeine (TYLENOL #4) 300-60 MG tablet Take 1 tablet by mouth every 4 (four) hours as needed for moderate pain. 30 tablet 0  . acetaminophen-codeine (TYLENOL #4) 300-60 MG tablet TAKE 1 TABLET BY MOUTH EVERY 4 HOURS AS NEEDED FOR MODERATE PAIN 30 tablet 0  . albuterol (PROAIR HFA) 108 (90 Base) MCG/ACT inhaler USE 1-2 PUFFS INTO  LUNGS EVERY 4 HOURS AS NEEDED FOR WHEEZING OR SHORTNESS OF BREATH. 8.5 Inhaler 0  . amLODipine (NORVASC) 10 MG tablet Take 1 tablet (10 mg total) by mouth daily. FOR BLOOD PRESSURE 90 tablet 0  . ammonium lactate (AMLACTIN) 12 % lotion Apply 1 application topically 2 (two) times daily as needed for dry skin. 400 g 0  . atorvastatin (LIPITOR) 40 MG tablet Take 1 tablet (40 mg total) daily by mouth. 90 tablet 3  . baclofen (LIORESAL) 10 MG tablet TAKE 1 TABLET BY MOUTH THREE TIMES A DAY 30 tablet 0  . blood glucose meter kit and supplies KIT Dispense based on patient and insurance preference. Use up to four times daily as directed. (FOR ICD-9 250.00, 250.01). 1 each 0  . busPIRone (BUSPAR) 10 MG tablet Take 1 tablet (10 mg total) by mouth 3 (three) times daily. 90 tablet 11  . cephALEXin (KEFLEX) 500 MG capsule Take 1 capsule (500 mg total) by mouth 2 (two) times daily for 7 days. 14 capsule 0  . citalopram (CELEXA) 20 MG tablet Take 1 tablet (20 mg total) by mouth daily. 90 tablet 0  . diclofenac sodium (VOLTAREN) 1 % GEL Apply 2 g topically 4 (four) times daily. 3 Tube 2  . fluticasone (FLONASE) 50 MCG/ACT nasal spray USE 2 SPRAYS EACH NOSTRIL TWICE DAILY. 16 g 5  . furosemide (LASIX) 20 MG tablet Take 1 tablet (20 mg total) by mouth 2 (two) times daily. 30 tablet 0  . gabapentin (NEURONTIN) 300 MG  capsule Take 2 capsules (600 mg total) by mouth 2 (two) times daily. 120 capsule 3  . glucose blood (ACCU-CHEK AVIVA) test strip Use as instructed 100 each 12  . hydrochlorothiazide (HYDRODIURIL) 25 MG tablet TAKE 1 TABLET EACH DAY. 90 tablet 0  . hydrOXYzine (VISTARIL) 100 MG capsule Take 1 capsule (100 mg total) by mouth 3 (three) times daily as needed for itching. 30 capsule 0  . Insulin Glargine (LANTUS SOLOSTAR) 100 UNIT/ML Solostar Pen Inject 20 Units into the skin daily. 15 mL 3  . insulin lispro (HUMALOG KWIKPEN) 100 UNIT/ML KiwkPen Inject 0.03 mLs (3 Units total) into the skin 3 (three) times  daily before meals. 15 mL 2  . Insulin Pen Needle (B-D UF III MINI PEN NEEDLES) 31G X 5 MM MISC CHECK SUGARS 4 TIMES A DAY BEFORE MEALS AND AT BEDTIME. 100 each 0  . ketoconazole (NIZORAL) 2 % cream Apply 1 fingertip amount to each foot daily. 30 g 0  . loratadine (CLARITIN) 10 MG tablet Take 1 tablet (10 mg total) by mouth daily. 90 tablet 0  . losartan (COZAAR) 100 MG tablet Take 1 tablet (100 mg total) by mouth daily. 90 tablet 3  . methocarbamol (ROBAXIN) 750 MG tablet TAKE 1 TABLET (750 MG TOTAL) BY MOUTH 2 (TWO) TIMES DAILY AS NEEDED FOR MUSCLE SPASMS. 60 tablet 0  . montelukast (SINGULAIR) 10 MG tablet Take 1 tablet (10 mg total) by mouth at bedtime. AS DIRECTED 90 tablet 3  . mupirocin ointment (BACTROBAN) 2 % Apply 1 application topically 2 (two) times daily. 22 g 0  . nicotine (NICODERM CQ - DOSED IN MG/24 HOURS) 21 mg/24hr patch Place 1 patch (21 mg total) onto the skin daily. 28 patch 2  . NON FORMULARY Uses a C-PAP at bedtime    . nystatin (MYCOSTATIN) 100000 UNIT/ML suspension Take 5 mLs (500,000 Units total) by mouth 4 (four) times daily. 60 mL 1  . omeprazole (PRILOSEC) 20 MG capsule Take 1 capsule (20 mg total) by mouth daily as needed. 30 capsule 0  . SPIRIVA HANDIHALER 18 MCG inhalation capsule INHALE 1 CAPSULE VIA HANDIHALER ONCE DAILY AT THE SAME TIME EVERY DAY 90 capsule 0   No facility-administered medications prior to visit.      Past Medical History:  Diagnosis Date  . Arthritis 04-10-11   hips, shoulders, back  . Asthma   . Bipolar disorder ([place])   . Cancer ([place]) 1993   cervical, no treatment done, went away per pt  . Cervical dysplasia or atypia 04-10-11   '93- once dx.-got pregnant-no intervention, then postpartum, no dysplasia found  . CHF (congestive heart failure) ([place])    no cardiologist 2014 dx, none now  . 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 Pathology showed Condyloma, gluteal CONDYLOMA ACUMINATUM.   [name] Kitchen COPD (chronic  obstructive pulmonary disease) ([place])   . Depression   . Diabetes mellitus without complication ([place])   . Dyspnea    with actity - better since taking Singular  . Fall 12/16/2016  . Fibrocystic breast disease   . GERD (gastroesophageal reflux disease)   . Hypercalcemia   . Hyperlipidemia   . Hypertension   . Migraine headache    none recent  . Morbid obesity ([place]) 03/27/2006  . Neuropathy   . Skin lesion 03/15/2011   In gluteal crease now s/p removal by Dr. [name] of General Surgery on 3/12. Path shows condyloma.     . Sleep apnea 04-10-11  uses cpap, pt does not know settings  . Trigger finger    left third      Past Surgical History:  Procedure Laterality Date  . ANAL FISTULECTOMY  04/17/2011   Procedure: FISTULECTOMY ANAL;  Surgeon: Imogene Burn. [name], MD;  Location: [facility];  Service: General;  Laterality: N/A;  Excision of Condyloma Gluteal Cleft   . BACK SURGERY  04-10-11   x5-Lumbar fusion-retained hardware.(Dr. Louanne Skye)  . BREAST CYST EXCISION     bilateral breast, 3 cysts removed from each breast  . BREAST EXCISIONAL BIOPSY Right    x 3  . BREAST EXCISIONAL BIOPSY Left    x 3  . CARPAL TUNNEL RELEASE Bilateral   . Cervical biospy    . COLONOSCOPY WITH PROPOFOL N/A 07/06/2015   Procedure: COLONOSCOPY WITH PROPOFOL;  Surgeon: [name], MD;  Location: [facility];  Service: Endoscopy;  Laterality: N/A;  . DOPPLER ECHOCARDIOGRAPHY  04/06/2012   AT [place] 55-60%  . I&D EXTREMITY Left 12/18/2016   Procedure: IRRIGATION AND DEBRIDEMENT LEFT FOOT, CLOSURE;  Surgeon: [name], MD;  Location: [facility];  Service: Orthopedics;  Laterality: Left;  [name] Kitchen MULTIPLE TOOTH EXTRACTIONS    . NECK SURGERY  04-10-11   x3- cervical fusion with plating and screws-Dr. [name]  . TEE WITHOUT CARDIOVERSION N/A 04/06/2012   Procedure: TRANSESOPHAGEAL ECHOCARDIOGRAM (TEE);  Surgeon: [name], MD;  Location: [facility] ENDOSCOPY;  Service: Cardiovascular;  Laterality: N/A;  . TRIGGER FINGER  RELEASE Right    middle finger  . TRIGGER FINGER RELEASE Left 06/28/2016   Procedure: RELEASE TRIGGER FINGER LEFT 3RD FINGER;  Surgeon: [name], MD;  Location: [facility];  Service: Orthopedics;  Laterality: Left;  . TRIGGER FINGER RELEASE Right 06/12/2016   Procedure: RIGHT INDEX FINGER TRIGGER RELEASE;  Surgeon: [name], MD;  Location: [facility];  Service: Orthopedics;  Laterality: Right;      Family History  Problem Relation Age of Onset  . Stroke Father   . Cancer Maternal Aunt        breast  . Asthma Other   . COPD Other   . Hypertension Other   . Diabetes Other       Social History   Socioeconomic History  . Marital status: Divorced    Spouse name: Not on file  . Number of children: Not on file  . Years of education: Not on file  . Highest education level: Not on file  Occupational History  . Not on file  Social Needs  . Financial resource strain: Not on file  . Food insecurity:    Worry: Never true    Inability: Often true  . Transportation needs:    Medical: Not on file    Non-medical: Not on file  Tobacco Use  . Smoking status: Current Every Day Smoker    Packs/day: 0.50    Years: 39.00    Pack years: 19.50    Types: Cigarettes    Start date: 01/28/1977  . Smokeless tobacco: Never Used  . Tobacco comment: Previous 1 ppd,  Quit during hospit. 12/16/2016  Substance and Sexual Activity  . Alcohol use: No    Alcohol/week: 0.0 oz  . Drug use: Yes    Types: Marijuana, "Crack" cocaine    Comment: hx marijuana usemany years ago, Crack -none since 11/2015  . Sexual activity: Yes    Partners: Male  Lifestyle  . Physical activity:    Days per week: Not on file  Minutes per session: Not on file  . Stress: Not on file  Relationships  . Social connections:    Talks on phone: Not on file    Gets together: Not on file    Attends religious service: Not on file    Active member of club or organization: Not on file    Attends meetings of clubs or  organizations: Not on file    Relationship status: Not on file  . Intimate partner violence:    Fear of current or ex partner: Not on file    Emotionally abused: Not on file    Physically abused: Not on file    Forced sexual activity: Not on file  Other Topics Concern  . Not on file  Social History Narrative   Current Social History 01/23/2017        Who lives at home: Patient lives alone in one level home 01/23/2017    Transportation: Patient has own vehicle and drives herself 22/97/9892   Important Relationships "Family and friends" 01/23/2017    Pets: None 01/23/2017   Education / Work:  12 th grade/ None 01/23/2017   Interests / Fun: "Play bingo on phone, watch TV, Be with family and friends, sit on my porch." 01/23/2017   Current Stressors: None 01/23/2017   Religious / Personal Beliefs: "God Jesus" 01/23/2017   Other: "I love everyone, I wake up with joy in my heart and go to sleep the same way. I'm a lovable person." 01/23/2017   L. Ducatte, RN, BSN                                                                                                  Review of Systems  Constitutional: Negative for activity change, appetite change, diaphoresis, fatigue, fever and unexpected weight change.  HENT: Negative for congestion, sinus pressure and sore throat.   Respiratory: Negative for cough, chest tightness, shortness of breath and wheezing.   Cardiovascular: Negative for chest pain and leg swelling.  Gastrointestinal: Negative for abdominal pain, constipation, diarrhea, nausea and vomiting.  Neurological: Negative for weakness and headaches.       Objective:    LMP 04/10/2006  Nursing note and vital signs reviewed.  Physical Exam  Constitutional: She is oriented to person, place, and time. She appears well-developed. No distress.  Cardiovascular: Normal rate, regular rhythm, normal heart sounds and intact distal pulses. Exam reveals no gallop and no friction rub.  No murmur  heard. Pulmonary/Chest: Effort normal and breath sounds normal. No respiratory distress. She has no wheezes. She has no rales. She exhibits no tenderness.  Abdominal: Soft. Bowel sounds are normal. She exhibits no distension, no ascites and no mass. There is no hepatosplenomegaly. There is no tenderness. There is no rebound and no guarding.  Neurological: She is alert and oriented to person, place, and time.  Skin: Skin is warm and dry.  Psychiatric: She has a normal mood and affect. Her behavior is normal. Judgment and thought content normal.        Assessment & Plan:   Problem List Items Addressed This Visit  None       I am having [name] maintain her NON FORMULARY, atorvastatin, blood glucose meter kit and supplies, glucose blood, ACCU-CHEK SOFTCLIX LANCETS, methocarbamol, Insulin Glargine, busPIRone, hydrochlorothiazide, amLODipine, omeprazole, hydrOXYzine, insulin lispro, gabapentin, diclofenac sodium, acetaminophen-codeine, mupirocin ointment, losartan, ketoconazole, fluticasone, loratadine, montelukast, Insulin Pen Needle, baclofen, ammonium lactate, nicotine, nystatin, albuterol, SPIRIVA HANDIHALER, citalopram, acetaminophen-codeine, furosemide, and cephALEXin.   No orders of the defined types were placed in this encounter.    Follow-up: No follow-ups on file.  Mauricio Po, [place] [facility]

## 2017-06-20 ENCOUNTER — Ambulatory Visit: Payer: Medicare Other | Admitting: Family

## 2017-06-25 ENCOUNTER — Ambulatory Visit (INDEPENDENT_AMBULATORY_CARE_PROVIDER_SITE_OTHER): Payer: Medicare Other | Admitting: Podiatry

## 2017-06-25 ENCOUNTER — Other Ambulatory Visit: Payer: Self-pay | Admitting: Student in an Organized Health Care Education/Training Program

## 2017-06-25 ENCOUNTER — Encounter: Payer: Self-pay | Admitting: Podiatry

## 2017-06-25 DIAGNOSIS — B353 Tinea pedis: Secondary | ICD-10-CM

## 2017-06-25 DIAGNOSIS — E1151 Type 2 diabetes mellitus with diabetic peripheral angiopathy without gangrene: Secondary | ICD-10-CM | POA: Diagnosis not present

## 2017-06-25 DIAGNOSIS — M79674 Pain in right toe(s): Secondary | ICD-10-CM | POA: Diagnosis not present

## 2017-06-25 DIAGNOSIS — M79675 Pain in left toe(s): Secondary | ICD-10-CM

## 2017-06-25 DIAGNOSIS — B351 Tinea unguium: Secondary | ICD-10-CM | POA: Diagnosis not present

## 2017-06-25 MED ORDER — AMMONIUM LACTATE 12 % EX LOTN: 1.0000 "application " | TOPICAL_LOTION | Freq: Two times a day (BID) | CUTANEOUS | 0 refills | Status: DC | PRN

## 2017-06-25 MED ORDER — KETOCONAZOLE 2 % EX CREA: TOPICAL_CREAM | CUTANEOUS | 0 refills | Status: DC

## 2017-06-25 NOTE — Progress Notes (Signed)
Complaint:  Visit Type: Patient returns to my office for continued preventative foot care services. Complaint: Patient states" my nails have grown long and thick and become painful to walk and wear shoes" Patient has been diagnosed with DM with angiopathy. The patient presents for preventative foot care services. No changes to ROS.  Severe   Podiatric Exam: Vascular: dorsalis pedis and posterior tibial pulses are palpable bilateral. Capillary return is immediate. Temperature gradient is WNL. Skin turgor WNL  Sensorium: Normal Semmes Weinstein monofilament test. Normal tactile sensation bilaterally. Nail Exam: Pt has thick disfigured discolored nails with subungual debris noted bilateral entire nail hallux through fifth toenails Ulcer Exam: There is no evidence of ulcer or pre-ulcerative changes or infection. Orthopedic Exam: Muscle tone and strength are WNL. No limitations in general ROM. No crepitus or effusions noted. Foot type and digits show no abnormalities. Bony prominences are unremarkable. Skin: No Porokeratosis. No infection or ulcers.  Thick hyperkeratotic skin right foot.  Diagnosis:  Onychomycosis, , Pain in right toe, pain in left toes  Treatment & Plan Procedures and Treatment: Consent by patient was obtained for treatment procedures.   Debridement of mycotic and hypertrophic toenails, 1 through 5 bilateral and clearing of subungual debris. No ulceration, no infection noted. Refill amlactin and ketoconazole. Return Visit-Office Procedure: Patient instructed to return to the office for a follow up visit 10 weeks  for continued evaluation and treatment.    Gardiner Barefoot DPM

## 2017-06-26 NOTE — Progress Notes (Signed)
[name] - 52 y.o. female MRN [id number]  Date of birth: 1965/03/20  Office Visit Note: Visit Date: 06/11/2017 PCP: [name], MD Referred by: [name], MD  Subjective: Chief Complaint  Patient presents with  . Lower Back - Pain  . Right Leg - Pain  . Left Leg - Pain   HPI: [name] is a 52 year old female that is status post lumbar instrumented fusion from L3 to the sacrum.  She is been having worsening low back and hip and leg pain approximately since a fall in November 2018.  She comes in today at the request of Dr. [name] for diagnostic medial branch blocks of the L1-2 and L2-3 facet joints above her fusion.  She has had MRI of the lumbar spine which is reviewed below.  We did complete a pain diary with her and will see her results with this.  If she does not get much relief could anticipate bilateral L2 transforaminal epidural steroid injection.   ROS Otherwise per HPI.  Assessment & Plan: Visit Diagnoses:  1. Spondylosis without myelopathy or radiculopathy, lumbar region   2. Chronic bilateral low back pain without sciatica     Plan: No additional findings.   Meds & Orders:  Meds ordered this encounter  Medications  . bupivacaine (MARCAINE) 0.5 % (with pres) injection 3 mL    Orders Placed This Encounter  Procedures  . Facet Injection  . XR C-ARM NO REPORT    Follow-up: Return if symptoms worsen or fail to improve, for Pain Diary Review.   Procedures: No procedures performed  Lumbar Diagnostic Facet Joint Nerve Block with Fluoroscopic Guidance   Patient: [name]      Date of Birth: 1965-03-11 MRN: [id number] PCP: [name], MD      Visit Date: 06/11/2017   Universal Protocol:    Date/Time: 05/30/195:48 AM  Consent Given By: the patient  Position: PRONE  Additional Comments: Vital signs were monitored before and after the procedure. Patient was prepped and draped in the usual sterile fashion. The correct patient,  procedure, and site was verified.   Injection Procedure Details:  Procedure Site One Meds Administered:  Meds ordered this encounter  Medications  . bupivacaine (MARCAINE) 0.5 % (with pres) injection 3 mL     Laterality: Bilateral  Location/Site:  L1-L2 L2-L3  Needle size: 22 ga.  Needle type:spinal  Needle Placement: Oblique pedical  Findings:   -Comments: There was excellent flow of contrast along the articular pillars without intravascular flow.  Procedure Details: The fluoroscope beam is vertically oriented in AP and then obliqued 15 to 20 degrees to the ipsilateral side of the desired nerve to achieve the "Scotty dog" appearance.  The skin over the target area of the junction of the superior articulating process and the transverse process (sacral ala if blocking the L5 dorsal rami) was locally anesthetized with a 1 ml volume of 1% Lidocaine without Epinephrine.  The spinal needle was inserted and advanced in a trajectory view down to the target.   After contact with periosteum and negative aspirate for blood and CSF, correct placement without intravascular or epidural spread was confirmed by injecting 0.5 ml. of Isovue-250.  A spot radiograph was obtained of this image.    Next, a 0.5 ml. volume of the injectate described above was injected. The needle was then redirected to the other facet joint nerves mentioned above if needed.  Prior to the procedure, the patient was given a Pain Diary  which was completed for baseline measurements.  After the procedure, the patient rated their pain every 30 minutes and will continue rating at this frequency for a total of 5 hours.  The patient has been asked to complete the Diary and return to [place] by mail, fax or hand delivered as soon as possible.   Additional Comments:  The patient tolerated the procedure well Dressing: Band-Aid    Post-procedure details: Patient was observed during the procedure. Post-procedure instructions were  reviewed.  Patient left the clinic in stable condition.   Clinical History: MRI LUMBAR SPINE WITHOUT AND WITH CONTRAST  TECHNIQUE: Multiplanar and multiecho pulse sequences of the lumbar spine were obtained without and with intravenous contrast.  CONTRAST:  49mL MULTIHANCE GADOBENATE DIMEGLUMINE 529 MG/ML IV SOLN  COMPARISON:  Prior radiographs from 10/07/16.  FINDINGS: Segmentation: Transitional lumbosacral anatomy. Lowest well-formed disc labeled the L5-S1 level.  Alignment: Trace retrolisthesis of L1 on L2. Vertebral bodies otherwise normally aligned with preservation of the normal lumbar lordosis. No listhesis or subluxation.  Vertebrae: Patient is status post posterior spinal fusion extending from L3 through S1. Interbody fusion devices in place at L3-4, L4-5, and L5-S1. Susceptibility artifact mildly limits evaluation at these levels.  Vertebral body heights maintained. No evidence for acute or chronic fracture. Bone marrow signal intensity within normal limits. No discrete or worrisome osseous lesions.  Conus medullaris: Extends to the L2-3 level and appears normal.  Paraspinal and other soft tissues: Remote postsurgical changes present within the posterior paraspinous musculature. Paraspinous soft tissues demonstrate no acute abnormality. Visualized kidneys are mildly atrophic in appearance. Partially visualized visceral structures otherwise unremarkable.  Disc levels:  T11-12: Seen only on sagittal projection. Diffuse disc bulge with disc desiccation and intervertebral disc space narrowing. Posterior element hypertrophy. Mild spinal stenosis. No significant foraminal encroachment.  T12-L1: Diffuse degenerative disc bulge with disc desiccation and intervertebral disc space narrowing. Associated reactive elbow marginal endplate osteophytosis. Mild bilateral facet arthrosis. Resultant mild to moderate canal stenosis with mild flattening of the  distal spinal cord. Moderate left foraminal narrowing related to disc bulge and facet disease (series 3, image 12). Multilead mild right neural foraminal narrowing.  L1-2: Trace retrolisthesis. Mild annular disc bulge, slightly eccentric to the right. Moderate facet arthrosis. No significant canal or lateral recess stenosis. Mild bilateral foraminal narrowing.  L2-3: Mild annular disc bulge. Fairly advanced bilateral facet arthrosis. Changes superimposed on short pedicles result in moderate canal stenosis. Mild bilateral foraminal narrowing without neural impingement.  L3-4: Sequelae of prior posterior and interbody fusion with posterior decompression. No residual canal stenosis. Foramina appear patent.  L4-5: Sequelae of prior posterior and interbody fusion with posterior decompression. Enhancing postoperative granulation tissue at the posterior laminectomy site. No residual canal stenosis. Foramina are grossly patent.  L5-S1: Sequelae of prior posterior and interbody fusion. Enhancing postoperative granulation tissue seen posteriorly and extending into the left lateral and ventral epidural space, surrounding the descending S1 nerve root. No residual canal stenosis. Foramina appear grossly patent.  IMPRESSION: 1. Postoperative changes from prior PLIF at L3 through S1 without residual or recurrent stenosis. 2. Advanced bilateral facet arthrosis at L2-3 with resultant moderate canal stenosis. 3. Degenerative disc bulging at T11-12 and T12-L1 with resultant mild to moderate canal stenosis as above. 4. Multifactorial degenerative changes at T12-L1 with resultant moderate left foraminal stenosis.   Electronically Signed   By: Jeannine Boga M.D.   On: 10/26/2016 13:15   She reports that she has been smoking cigarettes.  She started smoking  about 40 years ago. She has a 19.50 pack-year smoking history. She has never used smokeless tobacco.  Recent Labs     12/05/16 1024 03/20/17 1638 06/16/17 0937  HGBA1C 9.3 7.3 7.0    Objective:  VS:  HT:    WT:   BMI:     BP:(!) 144/83  HR:91bpm  TEMP: ( )  RESP:  Physical Exam  Ortho Exam Imaging: No results found.  Past Medical/Family/Surgical/Social History: Medications & Allergies reviewed per EMR, new medications updated. Patient Active Problem List   Diagnosis Date Noted  . Folliculitis 96/22/2979  . Oral thrush 06/09/2017  . Volume overload 06/06/2017  . Edema of left lower extremity 04/23/2017  . Chronic kidney disease (CKD), stage IV (severe) ([place]) 01/14/2017  . Fracture of fifth toe, left, open, initial encounter 12/18/2016  . Type 2 diabetes mellitus with stage 3 chronic kidney disease, with long-term current use of insulin ([place]) 12/12/2016  . Substance abuse ([place]) 11/01/2013  . Chronic diastolic CHF (congestive heart failure) ([place]) 04/07/2012  . GERD 01/26/2010  . Hypertension 08/04/2008  . OBSTRUCTIVE SLEEP APNEA 02/03/2008  . FIBROCYSTIC BREAST DISEASE 10/28/2006  . Hyperlipidemia 03/27/2006  . Morbid obesity ([place]) 03/27/2006  . TOBACCO DEPENDENCE 03/27/2006  . Depression with anxiety 03/27/2006  . NEUROGENIC BLADDER 03/27/2006   Past Medical History:  Diagnosis Date  . Arthritis 04-10-11   hips, shoulders, back  . Asthma   . Bipolar disorder ([place])   . Cancer ([place]) 1993   cervical, no treatment done, went away per pt  . Cervical dysplasia or atypia 04-10-11   '93- once dx.-got pregnant-no intervention, then postpartum, no dysplasia found  . CHF (congestive heart failure) ([place])    no cardiologist 2014 dx, none now  . 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 Pathology showed Condyloma, gluteal CONDYLOMA ACUMINATUM.   [name] Kitchen COPD (chronic obstructive pulmonary disease) ([place])   . Depression   . Diabetes mellitus without complication ([place])   . Dyspnea    with actity - better since taking Singular  . Fall 12/16/2016  . Fibrocystic breast disease    . GERD (gastroesophageal reflux disease)   . Hypercalcemia   . Hyperlipidemia   . Hypertension   . Migraine headache    none recent  . Morbid obesity ([place]) 03/27/2006  . Neuropathy   . Skin lesion 03/15/2011   In gluteal crease now s/p removal by Dr. [name] of General Surgery on 3/12. Path shows condyloma.     . Sleep apnea 04-10-11   uses cpap, pt does not know settings  . Trigger finger    left third   Family History  Problem Relation Age of Onset  . Stroke Father   . Cancer Maternal Aunt        breast  . Asthma Other   . COPD Other   . Hypertension Other   . Diabetes Other    Past Surgical History:  Procedure Laterality Date  . ANAL FISTULECTOMY  04/17/2011   Procedure: FISTULECTOMY ANAL;  Surgeon: Imogene Burn. [name], MD;  Location: [facility];  Service: General;  Laterality: N/A;  Excision of Condyloma Gluteal Cleft   . BACK SURGERY  04-10-11   x5-Lumbar fusion-retained hardware.(Dr. Louanne Skye)  . BREAST CYST EXCISION     bilateral breast, 3 cysts removed from each breast  . BREAST EXCISIONAL BIOPSY Right    x 3  . BREAST EXCISIONAL BIOPSY Left    x 3  . CARPAL TUNNEL RELEASE Bilateral   .  Cervical biospy    . COLONOSCOPY WITH PROPOFOL N/A 07/06/2015   Procedure: COLONOSCOPY WITH PROPOFOL;  Surgeon: [name], MD;  Location: [facility];  Service: Endoscopy;  Laterality: N/A;  . DOPPLER ECHOCARDIOGRAPHY  04/06/2012   AT [place] 55-60%  . I&D EXTREMITY Left 12/18/2016   Procedure: IRRIGATION AND DEBRIDEMENT LEFT FOOT, CLOSURE;  Surgeon: [name], MD;  Location: [facility];  Service: Orthopedics;  Laterality: Left;  [name] Kitchen MULTIPLE TOOTH EXTRACTIONS    . NECK SURGERY  04-10-11   x3- cervical fusion with plating and screws-Dr. [name]  . TEE WITHOUT CARDIOVERSION N/A 04/06/2012   Procedure: TRANSESOPHAGEAL ECHOCARDIOGRAM (TEE);  Surgeon: [name], MD;  Location: [facility] ENDOSCOPY;  Service: Cardiovascular;  Laterality: N/A;  . TRIGGER FINGER RELEASE Right    middle finger    . TRIGGER FINGER RELEASE Left 06/28/2016   Procedure: RELEASE TRIGGER FINGER LEFT 3RD FINGER;  Surgeon: [name], MD;  Location: [facility];  Service: Orthopedics;  Laterality: Left;  . TRIGGER FINGER RELEASE Right 06/12/2016   Procedure: RIGHT INDEX FINGER TRIGGER RELEASE;  Surgeon: [name], MD;  Location: [facility];  Service: Orthopedics;  Laterality: Right;   Social History   Occupational History  . Not on file  Tobacco Use  . Smoking status: Current Every Day Smoker    Packs/day: 0.50    Years: 39.00    Pack years: 19.50    Types: Cigarettes    Start date: 01/28/1977  . Smokeless tobacco: Never Used  . Tobacco comment: Previous 1 ppd,  Quit during hospit. 12/16/2016  Substance and Sexual Activity  . Alcohol use: No    Alcohol/week: 0.0 oz  . Drug use: Yes    Types: Marijuana, "Crack" cocaine    Comment: hx marijuana usemany years ago, Crack -none since 11/2015  . Sexual activity: Yes    Partners: Male

## 2017-06-26 NOTE — Procedures (Signed)
Lumbar Diagnostic Facet Joint Nerve Block with Fluoroscopic Guidance   Patient: [name]      Date of Birth: 07/01/1965 MRN: [id number] PCP: [name], MD      Visit Date: 06/11/2017   Universal Protocol:    Date/Time: 05/30/195:48 AM  Consent Given By: the patient  Position: PRONE  Additional Comments: Vital signs were monitored before and after the procedure. Patient was prepped and draped in the usual sterile fashion. The correct patient, procedure, and site was verified.   Injection Procedure Details:  Procedure Site One Meds Administered:  Meds ordered this encounter  Medications  . bupivacaine (MARCAINE) 0.5 % (with pres) injection 3 mL     Laterality: Bilateral  Location/Site:  L1-L2 L2-L3  Needle size: 22 ga.  Needle type:spinal  Needle Placement: Oblique pedical  Findings:   -Comments: There was excellent flow of contrast along the articular pillars without intravascular flow.  Procedure Details: The fluoroscope beam is vertically oriented in AP and then obliqued 15 to 20 degrees to the ipsilateral side of the desired nerve to achieve the "Scotty dog" appearance.  The skin over the target area of the junction of the superior articulating process and the transverse process (sacral ala if blocking the L5 dorsal rami) was locally anesthetized with a 1 ml volume of 1% Lidocaine without Epinephrine.  The spinal needle was inserted and advanced in a trajectory view down to the target.   After contact with periosteum and negative aspirate for blood and CSF, correct placement without intravascular or epidural spread was confirmed by injecting 0.5 ml. of Isovue-250.  A spot radiograph was obtained of this image.    Next, a 0.5 ml. volume of the injectate described above was injected. The needle was then redirected to the other facet joint nerves mentioned above if needed.  Prior to the procedure, the patient was given a Pain Diary which was completed for  baseline measurements.  After the procedure, the patient rated their pain every 30 minutes and will continue rating at this frequency for a total of 5 hours.  The patient has been asked to complete the Diary and return to [place] by mail, fax or hand delivered as soon as possible.   Additional Comments:  The patient tolerated the procedure well Dressing: Band-Aid    Post-procedure details: Patient was observed during the procedure. Post-procedure instructions were reviewed.  Patient left the clinic in stable condition.

## 2017-06-27 ENCOUNTER — Ambulatory Visit (INDEPENDENT_AMBULATORY_CARE_PROVIDER_SITE_OTHER): Payer: Medicare Other | Admitting: Specialist

## 2017-06-27 ENCOUNTER — Ambulatory Visit (INDEPENDENT_AMBULATORY_CARE_PROVIDER_SITE_OTHER): Payer: Medicare Other | Admitting: Family Medicine

## 2017-06-27 ENCOUNTER — Encounter (INDEPENDENT_AMBULATORY_CARE_PROVIDER_SITE_OTHER): Payer: Self-pay | Admitting: Specialist

## 2017-06-27 ENCOUNTER — Other Ambulatory Visit: Payer: Self-pay

## 2017-06-27 ENCOUNTER — Encounter: Payer: Self-pay | Admitting: Family Medicine

## 2017-06-27 VITALS — BP 121/75 | HR 87 | Ht 70.0 in | Wt 378.0 lb

## 2017-06-27 VITALS — BP 96/68 | HR 102 | Temp 98.9°F | Ht 70.0 in | Wt 387.4 lb

## 2017-06-27 DIAGNOSIS — M48062 Spinal stenosis, lumbar region with neurogenic claudication: Secondary | ICD-10-CM

## 2017-06-27 DIAGNOSIS — J449 Chronic obstructive pulmonary disease, unspecified: Secondary | ICD-10-CM

## 2017-06-27 DIAGNOSIS — Z886 Allergy status to analgesic agent status: Secondary | ICD-10-CM

## 2017-06-27 DIAGNOSIS — Z888 Allergy status to other drugs, medicaments and biological substances status: Secondary | ICD-10-CM

## 2017-06-27 DIAGNOSIS — F17200 Nicotine dependence, unspecified, uncomplicated: Secondary | ICD-10-CM | POA: Diagnosis not present

## 2017-06-27 DIAGNOSIS — E8779 Other fluid overload: Secondary | ICD-10-CM

## 2017-06-27 DIAGNOSIS — N3941 Urge incontinence: Secondary | ICD-10-CM

## 2017-06-27 DIAGNOSIS — Z981 Arthrodesis status: Secondary | ICD-10-CM | POA: Diagnosis not present

## 2017-06-27 DIAGNOSIS — J4489 Other specified chronic obstructive pulmonary disease: Secondary | ICD-10-CM | POA: Insufficient documentation

## 2017-06-27 MED ORDER — BACLOFEN 10 MG PO TABS: 20.0000 mg | ORAL_TABLET | Freq: Three times a day (TID) | ORAL | 0 refills | Status: DC

## 2017-06-27 MED ORDER — FLUTICASONE-UMECLIDIN-VILANT 100-62.5-25 MCG/INH IN AEPB: 100.0000 ug | INHALATION_SPRAY | Freq: Every day | RESPIRATORY_TRACT | 2 refills | Status: DC

## 2017-06-27 MED ORDER — HYDROCODONE-ACETAMINOPHEN 5-325 MG PO TABS: 1.0000 | ORAL_TABLET | Freq: Four times a day (QID) | ORAL | 0 refills | Status: DC | PRN

## 2017-06-27 NOTE — Progress Notes (Signed)
Subjective:    [name] - 52 y.o. female MRN [id number]  Date of birth: [birth year]  HPI  FAVOR KREH is here for follow up of leg swelling and shortness of breath.  Leg Swelling - takes 20 mg Lasix and 25 mg HCTZ daily in the morning at around 7-8 am - does not urinate much during the day, but urinates frequently at night - tries to keep legs elevated but has not yet gotten ted hose (plans to get them at [facility])  Shortness of breath - will have difficulty breathing, chest tightness, and cough after walking from one room to another in her house - cannot go to the pool to swim because walking to the pool as well as being around chlorine exacerbates her cough - uses Spiriva and albuterol at home - continues to smoke but has cut down from one pack a day to six cigarettes per day and is using the nicotine patch  Health Maintenance:  Health Maintenance Due  Topic Date Due  . FOOT EXAM  07/25/1973    -  reports that she has been smoking cigarettes.  She started smoking about 40 years ago. She has a 19.50 pack-year smoking history. She has never used smokeless tobacco. - Review of Systems: Per HPI. - Past Medical History: Patient Active Problem List   Diagnosis Date Noted  . COPD (chronic obstructive pulmonary disease) with chronic bronchitis ([place]) 06/27/2017  . Folliculitis 87/56/4332  . Oral thrush 06/09/2017  . Volume overload 06/06/2017  . Edema of left lower extremity 04/23/2017  . Chronic kidney disease (CKD), stage IV (severe) ([place]) 01/14/2017  . Fracture of fifth toe, left, open, initial encounter 12/18/2016  . Type 2 diabetes mellitus with stage 3 chronic kidney disease, with long-term current use of insulin ([place]) 12/12/2016  . Urge incontinence of urine 12/05/2016  . Substance abuse ([place]) 11/01/2013  . Chronic diastolic CHF (congestive heart failure) ([place]) 04/07/2012  . GERD 01/26/2010  . Hypertension 08/04/2008  . OBSTRUCTIVE SLEEP APNEA 02/03/2008  .  FIBROCYSTIC BREAST DISEASE 10/28/2006  . Hyperlipidemia 03/27/2006  . Morbid obesity ([place]) 03/27/2006  . TOBACCO DEPENDENCE 03/27/2006  . Depression with anxiety 03/27/2006  . NEUROGENIC BLADDER 03/27/2006   - Medications: reviewed and updated   Objective:   Physical Exam BP 96/68   Pulse (!) 102   Temp 98.9 F (37.2 C) (Oral)   Ht 5\' 10"  (1.778 m)   Wt (!) 387 lb 6.4 oz (175.7 kg)   LMP 04/10/2006   SpO2 91%   BMI 55.59 kg/m  Gen: NAD, alert, cooperative with exam, morbidly obese, pleasant HEENT: NCAT, clear conjunctiva, supple neck CV: RRR, good S1/S2, no murmur, 2+ pedal edema bilaterally with L shin worse and slightly erythematous Resp: CTABL, no wheezes, non-labored, reduced breath sounds bilaterally Abd: SNTND, BS present, no guarding or organomegaly Skin: no rashes, normal turgor  Neuro: no gross deficits.  Psych: good insight, alert and oriented  Diabetic Foot Exam - Simple   Simple Foot Form Visual Inspection No deformities, no ulcerations, no other skin breakdown bilaterally:  Yes Sensation Testing Intact to touch and monofilament testing bilaterally:  Yes Pulse Check Posterior Tibialis and Dorsalis pulse intact bilaterally:  Yes Comments         Assessment & Plan:   COPD (chronic obstructive pulmonary disease) with chronic bronchitis (HCC) Patient's shortness of breath is likely due to a combination of COPD, obesity hypoventilation syndrome, and CHF.  Due to her frequent cough and pulse  ox of 91% in the office, will stop Spiriva and try Trelegy instead.  Patient was told to call us if she has a problem obtaining or using the medication.  Told her that she may need oxygen in the future, but this is not a safe option for her while she is smoking.    Volume overload Volume overload likely a combination of CHF and venous insufficiency.  Patient may be urinating more at night because her kidneys are getting better perfused when she is supine, but it is possible  that Lasix is taking longer to have an effect due to medication interactions or her kidney disease.  She was advised to try the Lasix and HCTZ in the evenings to see if this helps, but I would be surprised if it would decrease the amount she is urinating at night.  Also encouraged to get ted hose and to elevate feet.  TOBACCO DEPENDENCE Congratulated patient on her progress and reiterated how important it is to continue cutting down tobacco use.  Patient will continue to use nicotine patches.    Maia Breslow, M.D. 06/27/2017, 9:51 AM PGY-1, [facility]

## 2017-06-27 NOTE — Assessment & Plan Note (Signed)
Patient's shortness of breath is likely due to a combination of COPD, obesity hypoventilation syndrome, and CHF.  Due to her frequent cough and pulse ox of 91% in the office, will stop Spiriva and try Trelegy instead.  Patient was told to call us if she has a problem obtaining or using the medication.  Told her that she may need oxygen in the future, but this is not a safe option for her while she is smoking.

## 2017-06-27 NOTE — Progress Notes (Signed)
Office Visit Note   Patient: [name]           Date of Birth: 08-Jan-1966           MRN: [id number] Visit Date: 06/27/2017              Requested by: [name], MD [address] [place], [place] [address] PCP: [name], MD   Assessment & Plan: Visit Diagnoses:  1. Spinal stenosis of lumbar region with neurogenic claudication     Plan: Neurogenic claudication, L3-S1 fusion with MRI with moderate stenosis, pain and claudication is becoming severe, Myelogram with standing flexion and extension radiographs to discern the severity of stenosis. Facet blocks without improvement in pain. Lumbar CT Scan.  Follow-Up Instructions: Return in about 3 weeks (around 07/18/2017).   Orders:  Orders Placed This Encounter  Procedures  . DG Myelogram Lumbar  . CT LUMBAR SPINE W CONTRAST   No orders of the defined types were placed in this encounter.     Procedures: No procedures performed   Clinical Data: No additional findings.   Subjective: Chief Complaint  Patient presents with  . Lower Back - Follow-up    She had bilateral L1-2, L2-3 Facet joint injections with Dr. Ernestina Patches on 06/11/2017.  She states no relief, increased bilat buttock pain and sharp left hip pain.    52 year old female returns with pain in her left buttock and radiation into the left posterior thigh not below the knee. The pain is burning and worsens with standing, walking and sitting.  There is pain with bending and stooping. She also has pain in the left dorsal wrist. I woke up one morning and the left wrist was killing me. I used the voltaren gel and it is still painful. She does not have a lift chair. She is still having significant pain with standing and walking. No doing much bending and stooping. But the walking is worse than bending or stooping and she  Is leaning on the grocery cart and she sits in the chair when doing work at the kitchen sink or at the stove.      Review of Systems    Constitutional: Negative.   HENT: Negative.   Eyes: Negative.   Respiratory: Negative.   Cardiovascular: Negative.   Gastrointestinal: Negative.   Endocrine: Negative.   Genitourinary: Negative.   Musculoskeletal: Negative.   Skin: Negative.   Allergic/Immunologic: Negative.   Neurological: Negative.   Hematological: Negative.   Psychiatric/Behavioral: Negative.      Objective: Vital Signs: BP 121/75 (BP Location: Left Arm, Patient Position: Sitting)   Pulse 87   Ht 5\' 10"  (1.778 m)   Wt (!) 378 lb (171.5 kg)   LMP 04/10/2006   BMI 54.24 kg/m   Physical Exam  Constitutional: She is oriented to person, place, and time. She appears well-developed and well-nourished.  HENT:  Head: Normocephalic and atraumatic.  Eyes: Pupils are equal, round, and reactive to light. EOM are normal.  Neck: Normal range of motion. Neck supple.  Pulmonary/Chest: Effort normal and breath sounds normal.  Abdominal: Soft. Bowel sounds are normal.  Neurological: She is alert and oriented to person, place, and time.  Skin: Skin is warm and dry.  Psychiatric: She has a normal mood and affect. Her behavior is normal. Judgment and thought content normal.    Back Exam   Tenderness  The patient is experiencing tenderness in the lumbar.  Range of Motion  Extension: abnormal  Flexion:  abnormal  Lateral bend right: abnormal  Lateral bend left: abnormal  Rotation right: abnormal  Rotation left: abnormal   Muscle Strength  Right Quadriceps:  5/5  Left Quadriceps:  5/5  Right Hamstrings:  5/5  Left Hamstrings:  5/5   Tests  Straight leg raise right: negative Straight leg raise left: negative  Reflexes  Patellar: 0/4 Achilles: 0/4 Babinski's sign: normal   Other  Toe walk: normal Heel walk: normal Sensation: normal Gait: normal       Specialty Comments:  No specialty comments available.  Imaging: No results found.   PMFS History: Patient Active Problem List   Diagnosis  Date Noted  . COPD (chronic obstructive pulmonary disease) with chronic bronchitis ([place]) 06/27/2017  . Folliculitis 09/38/1829  . Oral thrush 06/09/2017  . Volume overload 06/06/2017  . Edema of left lower extremity 04/23/2017  . Chronic kidney disease (CKD), stage IV (severe) ([place]) 01/14/2017  . Fracture of fifth toe, left, open, initial encounter 12/18/2016  . Type 2 diabetes mellitus with stage 3 chronic kidney disease, with long-term current use of insulin ([place]) 12/12/2016  . Urge incontinence of urine 12/05/2016  . Substance abuse ([place]) 11/01/2013  . Chronic diastolic CHF (congestive heart failure) ([place]) 04/07/2012  . GERD 01/26/2010  . Hypertension 08/04/2008  . OBSTRUCTIVE SLEEP APNEA 02/03/2008  . FIBROCYSTIC BREAST DISEASE 10/28/2006  . Hyperlipidemia 03/27/2006  . Morbid obesity ([place]) 03/27/2006  . TOBACCO DEPENDENCE 03/27/2006  . Depression with anxiety 03/27/2006  . NEUROGENIC BLADDER 03/27/2006   Past Medical History:  Diagnosis Date  . Arthritis 04-10-11   hips, shoulders, back  . Asthma   . Bipolar disorder ([place])   . Cancer ([place]) 1993   cervical, no treatment done, went away per pt  . Cervical dysplasia or atypia 04-10-11   '93- once dx.-got pregnant-no intervention, then postpartum, no dysplasia found  . CHF (congestive heart failure) ([place])    no cardiologist 2014 dx, none now  . 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 Pathology showed Condyloma, gluteal CONDYLOMA ACUMINATUM.   [name] Kitchen Dyspnea    with actity - better since taking Singular  . Fall 12/16/2016  . Fibrocystic breast disease   . Hypercalcemia   . Hyperlipidemia   . Hypertension   . Migraine headache    none recent  . Morbid obesity ([place]) 03/27/2006  . Neuropathy   . Skin lesion 03/15/2011   In gluteal crease now s/p removal by Dr. [name] of General Surgery on 3/12. Path shows condyloma.     . Sleep apnea 04-10-11   uses cpap, pt does not know settings  . Trigger finger     left third    Family History  Problem Relation Age of Onset  . Stroke Father   . Cancer Maternal Aunt        breast  . Asthma Other   . COPD Other   . Hypertension Other   . Diabetes Other     Past Surgical History:  Procedure Laterality Date  . ANAL FISTULECTOMY  04/17/2011   Procedure: FISTULECTOMY ANAL;  Surgeon: Imogene Burn. [name], MD;  Location: [facility];  Service: General;  Laterality: N/A;  Excision of Condyloma Gluteal Cleft   . BACK SURGERY  04-10-11   x5-Lumbar fusion-retained hardware.(Dr. Louanne Skye)  . BREAST CYST EXCISION     bilateral breast, 3 cysts removed from each breast  . BREAST EXCISIONAL BIOPSY Right    x 3  . BREAST EXCISIONAL BIOPSY Left  x 3  . CARPAL TUNNEL RELEASE Bilateral   . Cervical biospy    . COLONOSCOPY WITH PROPOFOL N/A 07/06/2015   Procedure: COLONOSCOPY WITH PROPOFOL;  Surgeon: [name], MD;  Location: [facility];  Service: Endoscopy;  Laterality: N/A;  . DOPPLER ECHOCARDIOGRAPHY  04/06/2012   AT [facility] 55-60%  . I&D EXTREMITY Left 12/18/2016   Procedure: IRRIGATION AND DEBRIDEMENT LEFT FOOT, CLOSURE;  Surgeon: [name], MD;  Location: [facility];  Service: Orthopedics;  Laterality: Left;  [name] Kitchen MULTIPLE TOOTH EXTRACTIONS    . NECK SURGERY  04-10-11   x3- cervical fusion with plating and screws-Dr. [name]  . TEE WITHOUT CARDIOVERSION N/A 04/06/2012   Procedure: TRANSESOPHAGEAL ECHOCARDIOGRAM (TEE);  Surgeon: [name], MD;  Location: [facility] ENDOSCOPY;  Service: Cardiovascular;  Laterality: N/A;  . TRIGGER FINGER RELEASE Right    middle finger  . TRIGGER FINGER RELEASE Left 06/28/2016   Procedure: RELEASE TRIGGER FINGER LEFT 3RD FINGER;  Surgeon: [name], MD;  Location: [facility];  Service: Orthopedics;  Laterality: Left;  . TRIGGER FINGER RELEASE Right 06/12/2016   Procedure: RIGHT INDEX FINGER TRIGGER RELEASE;  Surgeon: [name], MD;  Location: [facility];  Service: Orthopedics;  Laterality: Right;   Social History   Occupational  History  . Not on file  Tobacco Use  . Smoking status: Current Every Day Smoker    Packs/day: 0.50    Years: 39.00    Pack years: 19.50    Types: Cigarettes    Start date: 01/28/1977  . Smokeless tobacco: Never Used  . Tobacco comment: Previous 1 ppd,  Quit during hospit. 12/16/2016  Substance and Sexual Activity  . Alcohol use: No    Alcohol/week: 0.0 oz  . Drug use: Yes    Types: Marijuana, "Crack" cocaine    Comment: hx marijuana usemany years ago, Crack -none since 11/2015  . Sexual activity: Yes    Partners: Male

## 2017-06-27 NOTE — Assessment & Plan Note (Signed)
Congratulated patient on her progress and reiterated how important it is to continue cutting down tobacco use.  Patient will continue to use nicotine patches.

## 2017-06-27 NOTE — Patient Instructions (Addendum)
It was nice seeing you today [name]!  For your breathing, we will start Trelegy once per day.  Please stop the Spiriva.  For your swelling, please continue Lasix.  You can take two pills per day if the swelling gets bad.  Try taking it in the evening if that helps you pee less during the night.  Please continue your hard work with quitting smoking.  This will be a huge help to your heart and lungs.  If you have any questions or concerns, please feel free to call the clinic.   Be well,  Dr. Shan Levans

## 2017-06-27 NOTE — Assessment & Plan Note (Signed)
Volume overload likely a combination of CHF and venous insufficiency.  Patient may be urinating more at night because her kidneys are getting better perfused when she is supine, but it is possible that Lasix is taking longer to have an effect due to medication interactions or her kidney disease.  She was advised to try the Lasix and HCTZ in the evenings to see if this helps, but I would be surprised if it would decrease the amount she is urinating at night.  Also encouraged to get ted hose and to elevate feet.

## 2017-06-27 NOTE — Patient Instructions (Signed)
Avoid bending, stooping and avoid lifting weights greater than 10 lbs. Avoid prolNeurogenic claudication, L3-S1 fusion with MRI with moderate stenosis, pain and claudication is becoming severe, Myelogram with standing flexion and extension radiographs to discern the severity of stenosis. Facet blocks without improvement in pain. Lumbar CT Scan.ong standing and walking. Avoid frequent bending and stooping  No lifting greater than 10 lbs. May use ice or moist heat for pain. Weight loss is of benefit. Handicap license is approved. A myelogram and post myelogram CT scan of the lumbar spine is ordered to assess the degree of stenosis with bending and standing films.

## 2017-06-28 ENCOUNTER — Other Ambulatory Visit: Payer: Self-pay | Admitting: Student in an Organized Health Care Education/Training Program

## 2017-06-28 DIAGNOSIS — E8770 Fluid overload, unspecified: Secondary | ICD-10-CM

## 2017-06-28 DIAGNOSIS — R600 Localized edema: Secondary | ICD-10-CM

## 2017-06-28 DIAGNOSIS — R0789 Other chest pain: Secondary | ICD-10-CM

## 2017-06-30 ENCOUNTER — Telehealth (INDEPENDENT_AMBULATORY_CARE_PROVIDER_SITE_OTHER): Payer: Self-pay | Admitting: Specialist

## 2017-06-30 NOTE — Telephone Encounter (Signed)
Patient left voicemail message stating that at her appointment on 06/27/17, she was told by Dr Louanne Skye that he was ordering a arm sling and she was checking to see when it would be available.

## 2017-06-30 NOTE — Telephone Encounter (Signed)
I don't recall ordering this patient an arm sling. [name]

## 2017-07-01 NOTE — Telephone Encounter (Signed)
When I called and talked to patient she states that it was for her Left wrist.  I advised that the OV note didn't say anything about her wrist.  Was she supposed to get a wrist splint?

## 2017-07-02 ENCOUNTER — Other Ambulatory Visit: Payer: Self-pay | Admitting: Student in an Organized Health Care Education/Training Program

## 2017-07-02 DIAGNOSIS — R0789 Other chest pain: Secondary | ICD-10-CM

## 2017-07-02 DIAGNOSIS — R600 Localized edema: Secondary | ICD-10-CM

## 2017-07-02 DIAGNOSIS — E8770 Fluid overload, unspecified: Secondary | ICD-10-CM

## 2017-07-06 ENCOUNTER — Other Ambulatory Visit (INDEPENDENT_AMBULATORY_CARE_PROVIDER_SITE_OTHER): Payer: Self-pay | Admitting: Specialist

## 2017-07-07 ENCOUNTER — Telehealth: Payer: Self-pay | Admitting: Family Medicine

## 2017-07-07 ENCOUNTER — Other Ambulatory Visit (INDEPENDENT_AMBULATORY_CARE_PROVIDER_SITE_OTHER): Payer: Self-pay | Admitting: Specialist

## 2017-07-07 MED ORDER — HYDROCODONE-ACETAMINOPHEN 5-325 MG PO TABS: 1.0000 | ORAL_TABLET | Freq: Four times a day (QID) | ORAL | 0 refills | Status: DC | PRN

## 2017-07-07 NOTE — Telephone Encounter (Signed)
Baclofen refill request 

## 2017-07-07 NOTE — Telephone Encounter (Signed)
Spoke to pt. She cant bend, stoop or lift over 10 lbs because of her back. She can't walk far because of her breathing. Needs someone to help with bathing, cooking and cleaning. Would like someone 2 times a day. Once in the am and once in the pm. I made pt an appt with Dr. Lindell Noe on 6/28. [name], CMA

## 2017-07-07 NOTE — Telephone Encounter (Signed)
I am covering Dr. Ernestina Penna inbox. I received a form for personal care services for [name]. [facility], could you call to clarify with patient as I have not met her before? The form states she needs help with her activities of daily living (bathing, toileting, feeding herself, etc). Could you ask her which of these she needs help with and what limits her (pain, SOB, etc) in doing these? Thanks!

## 2017-07-07 NOTE — Telephone Encounter (Signed)
Patient requesting rx refill on hydrocodone. Patients # [phone] °

## 2017-07-07 NOTE — Telephone Encounter (Signed)
Sent request to Dr. Nitka 

## 2017-07-08 ENCOUNTER — Ambulatory Visit (INDEPENDENT_AMBULATORY_CARE_PROVIDER_SITE_OTHER): Payer: Medicare Other | Admitting: Family

## 2017-07-08 ENCOUNTER — Encounter: Payer: Self-pay | Admitting: Family

## 2017-07-08 VITALS — BP 125/81 | HR 84 | Temp 98.3°F | Ht 70.0 in | Wt 391.0 lb

## 2017-07-08 DIAGNOSIS — R768 Other specified abnormal immunological findings in serum: Secondary | ICD-10-CM | POA: Diagnosis not present

## 2017-07-08 DIAGNOSIS — B181 Chronic viral hepatitis B without delta-agent: Secondary | ICD-10-CM | POA: Insufficient documentation

## 2017-07-08 NOTE — Patient Instructions (Signed)
Nice to meet you!  We will check your blood work today.  Any additional treatment will be depending upon blood work results.   Limit acetaminophen (Tylenol) usage to no more than 2 grams (2,000 mg) per day.  Avoid alcohol.  Do not share toothbrushes or razors.  Practice safe sex to protect against transmission as well as sexually transmitted disease.     Hepatitis B Hepatitis B is a viral infection of the liver. There are two kinds of hepatitis B:  Acute hepatitis B. This lasts for six months or less.  Chronic hepatitis B. This lasts for more than six months. Chronic hepatitis B can lead to liver failure, scarring of the liver (cirrhosis), or liver cancer.  Acute hepatitis B can turn into chronic hepatitis B. Most adults with acute hepatitis B do not develop chronic hepatitis B. Infants and young children who get hepatitis B are more likely to develop chronic hepatitis B than adults. The hepatitis B vaccine can prevent this condition. What are the causes? This condition is caused by the hepatitis B virus (HBV). The virus may spread from person to person (is contagious) through:  Blood.  Childbirth. A woman who has hepatitis B can pass it to her baby during birth.  Bodily fluids such as breast milk, tears, semen, vaginal fluids, and saliva.  What increases the risk? The following factors may make you more likely to develop this condition:  Having contact with unclean (contaminated) needles or syringes. This may result from: ? Acupuncture. ? Tattooing. ? Body piercing. ? Injecting drugs.  Having unprotected sex with someone who is infected.  Living with or having close contact with a person who has hepatitis B.  Working in a job that involves contact with blood or bodily fluids, such as health care.  Traveling to a country that has many cases of hepatitis B.  Being on treatment to filter your blood (kidney dialysis).  Having a history of blood transfusions or organ  transplants.  What are the signs or symptoms? Symptoms of this condition may include:  Loss of appetite.  Fatigue.  Nausea.  Vomiting.  Stomach pain.  Dark yellow urine.  Yellowish skin and eyes (jaundice).  Fever.  Light-colored or gray bowel movements.  Joint pain.  In some cases, you may not have any symptoms. How is this diagnosed? This condition is diagnosed based on:  A physical exam.  Your medical history.  Blood tests.  How is this treated? Treatment for chronic hepatitis B may include antiviral medicine. This medicine may help:  Lower your risk of liver failure, cirrhosis, or liver cancer.  Lower your ability to infect others with hepatitis B.  You will need to avoid alcohol and medicines that can be hard for the liver to break down (metabolize). This helps prevent further injury to your liver. Follow these instructions at home: Medicines  Take over-the-counter and prescription medicines only as told by your health care provider.  Take your antiviral medicine as told by your health care provider. Do not stop taking the antiviral even if you start to feel better.  Do not take any over-the-counter medicines that contain acetaminophen.  Do not take any new medicines, including over-the-counter medicines for fever or pain, unless approved by your health care provider. Activity  Rest as needed.  Do not have sex unless approved by your health care provider.  Ask your health care provider when you may return to school or work. Eating and drinking  Eat a balanced diet  with plenty of fruits and vegetables, whole grains, and lowfat (lean) meats or other non-meat proteins (such as beans or tofu).  Drink enough fluids to keep your urine clear or pale yellow.  Avoid alcohol. General instructions  Do not share toothbrushes, nail clippers, or razors.  Wash your hands frequently with soap and water. If soap and water are not available, use hand  sanitizer.  Keep all follow-up visits as told by your health care provider. This is important. How is this prevented?  Get the hepatitis B vaccine. This helps prevent the hepatitis B infection.  Wash your hands frequently with soap and water. If soap and water are not available, use hand sanitizer.  Do not share needles or syringes.  Practice safe sex and use condoms.  Avoid handling blood or bodily fluids without gloves or other protection.  Avoid getting tattoos or piercings in shops or other locations that are not clean. Contact a health care provider if:  You develop a rash.  You develop jaundice, or your chronic jaundice becomes more severe.  You have a fever. Get help right away if:  You are unable to eat or drink.  You have a fever along with nausea or vomiting.  You feel confused.  You have trouble breathing.  Your skin, throat, mouth, or face becomes swollen.  You have jerky movements that you cannot control (seizure).  You become very sleepy or have trouble waking up.  Your stomach becomes very swollen. Summary  Hepatitis B is a viral infection of the liver. There are two kinds of hepatitis B: acute and chronic.  The hepatitis B virus (HBV) can be passed from person to person (is contagious).  You should not take any new medicines, including over-the-counter medicines for fever or pain, unless approved by your health care provider.  To help prevent hepatitis B, wash your hands frequently with soap and water. If soap and water are not available, use hand sanitizer. This information is not intended to replace advice given to you by your health care provider. Make sure you discuss any questions you have with your health care provider. Document Released: 01/12/2000 Document Revised: 02/20/2016 Document Reviewed: 02/20/2016 Elsevier Interactive Patient Education  [employer].

## 2017-07-08 NOTE — Assessment & Plan Note (Signed)
Ms. Roediger was initially noted to have a positive Hepatitis B surface antigen in 2006 and to her knowledge has not been further evaluated. She is currently asymptomatic. Believes that she may have received the virus following sharing of razors with someone who is positive for Hepatitis B. CT scan from 2018 with no evidence of cirrhosis. Will check Hepatitis B blood work to determine current status and potential need for treatment. Counseled regarding transmission including not to share razors/toothbrushes with other and to limit acetaminophen to 2 grams per day and avoid alcohol. Additional treatment if needed following blood work results.

## 2017-07-08 NOTE — Telephone Encounter (Signed)
Tried to call but VM has not been setup, we will try again later

## 2017-07-08 NOTE — Telephone Encounter (Signed)
Pt is aware her rx is ready for pick up at the front desk 

## 2017-07-08 NOTE — Progress Notes (Signed)
Subjective:    Patient ID: [name], female    DOB: [birth year], 52 y.o.   MRN: [id number]  Chief Complaint  Patient presents with  . Hepatitis B   HPI:  [name] is a 52 y.o. female who presents today for initial evaluation and treatment for Hepatitis B infection.  [name] was recently evaluated by her nephrologist, [name], and found to have a positive Hepatitis B Surface Antigen. She noted that she tested positive for Hepatitis B in 2006, but has sought further investigation. No additional testing was completed at the time. She previous tested positive in March of 2008 and noted to have a negative Hepatitis B Core Antibody IgM indicating the likelihood of chronic inactive Hepatitis B. She has had a CT scan of her abdomen performed in August 2018 showing no focal liver abnormality. Risk factors for Hepatitis B include sharing a razor that she believes was infected.   Allergies  Allergen Reactions  . 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 Levofloxacin Itching      Outpatient Medications Prior to Visit  Medication Sig Dispense Refill  . ACCU-CHEK SOFTCLIX LANCETS lancets Use as instructed 100 each 12  . albuterol (PROAIR HFA) 108 (90 Base) MCG/ACT inhaler USE 1-2 PUFFS INTO LUNGS EVERY 4 HOURS AS NEEDED FOR WHEEZING OR SHORTNESS OF BREATH. 8.5 Inhaler 0  . amLODipine (NORVASC) 10 MG tablet Take 1 tablet (10 mg total) by mouth daily. FOR BLOOD PRESSURE 90 tablet 0  . ammonium lactate (AMLACTIN) 12 % lotion Apply 1 application topically 2 (two) times daily as needed for dry skin. 400 g 0  . atorvastatin (LIPITOR) 40 MG tablet Take 1 tablet (40 mg total) daily by mouth. 90 tablet 3  . baclofen (LIORESAL) 10 MG tablet TAKE 2 TABLETS (20 MG TOTAL) BY MOUTH 3 (THREE) TIMES DAILY. 42 tablet 0  . blood glucose meter kit and supplies KIT Dispense based on patient and insurance  preference. Use up to four times daily as directed. (FOR ICD-9 250.00, 250.01). 1 each 0  . busPIRone (BUSPAR) 10 MG tablet Take 1 tablet (10 mg total) by mouth 3 (three) times daily. 90 tablet 11  . citalopram (CELEXA) 20 MG tablet Take 1 tablet (20 mg total) by mouth daily. 90 tablet 0  . diclofenac sodium (VOLTAREN) 1 % GEL Apply 2 g topically 4 (four) times daily. 3 Tube 2  . fluticasone (FLONASE) 50 MCG/ACT nasal spray USE 2 SPRAYS EACH NOSTRIL TWICE DAILY. 16 g 5  . Fluticasone-Umeclidin-Vilant (TRELEGY ELLIPTA) 100-62.5-25 MCG/INH AEPB Inhale 100 mcg into the lungs daily. 28 each 2  . furosemide (LASIX) 20 MG tablet Take 1 tablet (20 mg total) by mouth 2 (two) times daily. 60 tablet 0  . gabapentin (NEURONTIN) 300 MG capsule Take 2 capsules (600 mg total) by mouth 2 (two) times daily. 120 capsule 3  . glucose blood (ACCU-CHEK AVIVA) test strip Use as instructed 100 each 12  . hydrochlorothiazide (HYDRODIURIL) 25 MG tablet TAKE 1 TABLET EACH DAY. 90 tablet 0  . HYDROcodone-acetaminophen (NORCO/VICODIN) 5-325 MG tablet Take 1 tablet by mouth every 6 (six) hours as needed. 30 tablet 0  . hydrOXYzine (VISTARIL) 100 MG capsule Take 1 capsule (100 mg total) by mouth 3 (three) times daily as needed for itching. 30 capsule 0  . Insulin Glargine (LANTUS SOLOSTAR) 100 UNIT/ML  Solostar Pen Inject 20 Units into the skin daily. 15 mL 3  . insulin lispro (HUMALOG KWIKPEN) 100 UNIT/ML KiwkPen Inject 0.03 mLs (3 Units total) into the skin 3 (three) times daily before meals. 15 mL 2  . Insulin Pen Needle (B-D UF III MINI PEN NEEDLES) 31G X 5 MM MISC CHECK SUGARS 4 TIMES A DAY BEFORE MEALS AND AT BEDTIME. 100 each 0  . ketoconazole (NIZORAL) 2 % cream Apply 1 fingertip amount to each foot daily. 30 g 0  . loratadine (CLARITIN) 10 MG tablet Take 1 tablet (10 mg total) by mouth daily. 90 tablet 0  . losartan (COZAAR) 100 MG tablet Take 1 tablet (100 mg total) by mouth daily. 90 tablet 3  . methocarbamol  (ROBAXIN) 750 MG tablet TAKE 1 TABLET (750 MG TOTAL) BY MOUTH 2 (TWO) TIMES DAILY AS NEEDED FOR MUSCLE SPASMS. 60 tablet 0  . montelukast (SINGULAIR) 10 MG tablet Take 1 tablet (10 mg total) by mouth at bedtime. AS DIRECTED 90 tablet 3  . mupirocin ointment (BACTROBAN) 2 % Apply 1 application topically 2 (two) times daily. 22 g 0  . nicotine (NICODERM CQ - DOSED IN MG/24 HOURS) 21 mg/24hr patch Place 1 patch (21 mg total) onto the skin daily. 28 patch 2  . NON FORMULARY Uses a C-PAP at bedtime    . nystatin (MYCOSTATIN) 100000 UNIT/ML suspension Take 5 mLs (500,000 Units total) by mouth 4 (four) times daily. 60 mL 1  . omeprazole (PRILOSEC) 20 MG capsule Take 1 capsule (20 mg total) by mouth daily as needed. 30 capsule 0  . HYDROcodone-acetaminophen (NORCO) 7.5-325 MG tablet TAKE 1 TABLET BY MOUTH EVERY 6 HOURS AS NEEDED FOR MODERATE PAIN    . acetaminophen-codeine (TYLENOL #4) 300-60 MG tablet Take 1 tablet by mouth every 4 (four) hours as needed for moderate pain. (Patient not taking: Reported on 07/08/2017) 30 tablet 0  . SPIRIVA HANDIHALER 18 MCG inhalation capsule INHALE 1 CAPSULE VIA HANDIHALER ONCE DAILY AT THE SAME TIME EVERY DAY (Patient not taking: Reported on 07/08/2017) 90 capsule 0   No facility-administered medications prior to visit.      Past Medical History:  Diagnosis Date  . Arthritis 04-10-11   hips, shoulders, back  . Asthma   . Bipolar disorder ([place])   . Cancer ([place]) 1993   cervical, no treatment done, went away per pt  . Cervical dysplasia or atypia 04-10-11   '93- once dx.-got pregnant-no intervention, then postpartum, no dysplasia found  . CHF (congestive heart failure) ([place])    no cardiologist 2014 dx, none now  . 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 Pathology showed Condyloma, gluteal CONDYLOMA ACUMINATUM.   [name] Kitchen Dyspnea    with actity - better since taking Singular  . Fall 12/16/2016  . Fibrocystic breast disease   . Hypercalcemia     . Hyperlipidemia   . Hypertension   . Migraine headache    none recent  . Morbid obesity ([place]) 03/27/2006  . Neuropathy   . Skin lesion 03/15/2011   In gluteal crease now s/p removal by Dr. [name] of General Surgery on 3/12. Path shows condyloma.     . Sleep apnea 04-10-11   uses cpap, pt does not know settings  . Trigger finger    left third      Past Surgical History:  Procedure Laterality Date  . ANAL FISTULECTOMY  04/17/2011   Procedure: FISTULECTOMY ANAL;  Surgeon: Imogene Burn. Tsuei, MD;  Location: [facility];  Service: General;  Laterality: N/A;  Excision of Condyloma Gluteal Cleft   . BACK SURGERY  04-10-11   x5-Lumbar fusion-retained hardware.(Dr. Louanne Skye)  . BREAST CYST EXCISION     bilateral breast, 3 cysts removed from each breast  . BREAST EXCISIONAL BIOPSY Right    x 3  . BREAST EXCISIONAL BIOPSY Left    x 3  . CARPAL TUNNEL RELEASE Bilateral   . Cervical biospy    . COLONOSCOPY WITH PROPOFOL N/A 07/06/2015   Procedure: COLONOSCOPY WITH PROPOFOL;  Surgeon: [name], MD;  Location: [facility];  Service: Endoscopy;  Laterality: N/A;  . DOPPLER ECHOCARDIOGRAPHY  04/06/2012   AT [place] 55-60%  . I&D EXTREMITY Left 12/18/2016   Procedure: IRRIGATION AND DEBRIDEMENT LEFT FOOT, CLOSURE;  Surgeon: [name], MD;  Location: [facility];  Service: Orthopedics;  Laterality: Left;  [name] Kitchen MULTIPLE TOOTH EXTRACTIONS    . NECK SURGERY  04-10-11   x3- cervical fusion with plating and screws-Dr. [name]  . TEE WITHOUT CARDIOVERSION N/A 04/06/2012   Procedure: TRANSESOPHAGEAL ECHOCARDIOGRAM (TEE);  Surgeon: [name], MD;  Location: [facility];  Service: Cardiovascular;  Laterality: N/A;  . TRIGGER FINGER RELEASE Right    middle finger  . TRIGGER FINGER RELEASE Left 06/28/2016   Procedure: RELEASE TRIGGER FINGER LEFT 3RD FINGER;  Surgeon: [name], MD;  Location: [facility];  Service: Orthopedics;  Laterality: Left;  . TRIGGER FINGER RELEASE Right 06/12/2016   Procedure: RIGHT  INDEX FINGER TRIGGER RELEASE;  Surgeon: [name], MD;  Location: [facility];  Service: Orthopedics;  Laterality: Right;      Family History  Problem Relation Age of Onset  . Stroke Father   . Cancer Maternal Aunt        breast  . Asthma Other   . COPD Other   . Hypertension Other   . Diabetes Other       Social History   Socioeconomic History  . Marital status: Divorced    Spouse name: Not on file  . Number of children: Not on file  . Years of education: Not on file  . Highest education level: Not on file  Occupational History  . Not on file  Social Needs  . Financial resource strain: Not on file  . Food insecurity:    Worry: Never true    Inability: Often true  . Transportation needs:    Medical: Not on file    Non-medical: Not on file  Tobacco Use  . Smoking status: Current Every Day Smoker    Packs/day: 0.50    Years: 39.00    Pack years: 19.50    Types: Cigarettes    Start date: 01/28/1977  . Smokeless tobacco: Never Used  . Tobacco comment: Previous 1 ppd,  Quit during hospit. 12/16/2016  Substance and Sexual Activity  . Alcohol use: No    Alcohol/week: 0.0 oz  . Drug use: Not Currently    Types: Marijuana, "Crack" cocaine    Comment: hx marijuana usemany years ago, Crack -none since 11/2015  . Sexual activity: Yes    Partners: Male  Lifestyle  . Physical activity:    Days per week: Not on file    Minutes per session: Not on file  . Stress: Not on file  Relationships  . Social connections:    Talks on phone: Not on file    Gets together: Not on file    Attends religious service: Not on  file    Active member of club or organization: Not on file    Attends meetings of clubs or organizations: Not on file    Relationship status: Not on file  . Intimate partner violence:    Fear of current or ex partner: Not on file    Emotionally abused: Not on file    Physically abused: Not on file    Forced sexual activity: Not on file  Other Topics Concern  .  Not on file  Social History Narrative   Current Social History 01/23/2017        Who lives at home: Patient lives alone in one level home 01/23/2017    Transportation: Patient has own vehicle and drives herself 69/48/5462   Important Relationships "Family and friends" 01/23/2017    Pets: None 01/23/2017   Education / Work:  12 th grade/ None 01/23/2017   Interests / Fun: "Play bingo on phone, watch TV, Be with family and friends, sit on my porch." 01/23/2017   Current Stressors: None 01/23/2017   Religious / Personal Beliefs: "God Jesus" 01/23/2017   Other: "I love everyone, I wake up with joy in my heart and go to sleep the same way. I'm a lovable person." 01/23/2017   L. Ducatte, RN, BSN                                                                                                    Review of Systems  Constitutional: Negative for chills, fatigue, fever and unexpected weight change.  Respiratory: Negative for cough, chest tightness, shortness of breath and wheezing.   Cardiovascular: Negative for chest pain and palpitations.  Gastrointestinal: Negative for abdominal distention, abdominal pain, anal bleeding, blood in stool, constipation, diarrhea, nausea, rectal pain and vomiting.       Objective:    BP 125/81   Pulse 84   Temp 98.3 F (36.8 C) (Oral)   Ht '5\' 10"'  (1.778 m)   Wt (!) 391 lb (177.4 kg)   LMP 04/10/2006   BMI 56.10 kg/m  Nursing note and vital signs reviewed.  Physical Exam  Constitutional: She is oriented to person, place, and time. She appears well-developed. No distress.  Seated in the chair; pleasant   Cardiovascular: Normal rate, regular rhythm, normal heart sounds and intact distal pulses. Exam reveals no gallop and no friction rub.  No murmur heard. Pulmonary/Chest: Effort normal and breath sounds normal. No respiratory distress. She has no wheezes. She has no rales. She exhibits no tenderness.  Abdominal: Soft. Bowel sounds are normal. She  exhibits no distension, no ascites and no mass. There is no hepatosplenomegaly. There is no tenderness. There is no rebound and no guarding.  Obese  Neurological: She is alert and oriented to person, place, and time.  Skin: Skin is warm and dry.  Psychiatric: She has a normal mood and affect. Her behavior is normal. Judgment and thought content normal.        Assessment & Plan:   Problem List Items Addressed This Visit      Other   Hepatitis B surface  antigen positive - Primary    Ms. Poliquin was initially noted to have a positive Hepatitis B surface antigen in 2006 and to her knowledge has not been further evaluated. She is currently asymptomatic. Believes that she may have received the virus following sharing of razors with someone who is positive for Hepatitis B. CT scan from 2018 with no evidence of cirrhosis. Will check Hepatitis B blood work to determine current status and potential need for treatment. Counseled regarding transmission including not to share razors/toothbrushes with other and to limit acetaminophen to 2 grams per day and avoid alcohol. Additional treatment if needed following blood work results.       Relevant Orders   Hepatitis B core antibody, IgM   Hepatitis B core antibody, total   Hepatitis B DNA, ultraquantitative, PCR   Hepatitis B e antibody   Hepatitis B e antigen   Hepatitis B surface antibody   Hepatitis B surface antigen       I have discontinued Kaili I. Narducci's acetaminophen-codeine and SPIRIVA HANDIHALER. I am also having her maintain her NON FORMULARY, atorvastatin, blood glucose meter kit and supplies, glucose blood, ACCU-CHEK SOFTCLIX LANCETS, methocarbamol, Insulin Glargine, busPIRone, hydrochlorothiazide, amLODipine, omeprazole, hydrOXYzine, insulin lispro, gabapentin, diclofenac sodium, mupirocin ointment, losartan, fluticasone, loratadine, montelukast, nicotine, nystatin, albuterol, citalopram, ammonium lactate, ketoconazole, Insulin Pen  Needle, Fluticasone-Umeclidin-Vilant, furosemide, baclofen, and HYDROcodone-acetaminophen.    Follow-up: Pending blood work results    Mauricio Po, [facility]

## 2017-07-09 ENCOUNTER — Other Ambulatory Visit: Payer: Self-pay | Admitting: Radiology

## 2017-07-09 NOTE — Telephone Encounter (Signed)
Filled out form, placed in return fax box.

## 2017-07-10 LAB — HEPATITIS B SURFACE ANTIGEN
CONFIRMATION: REACTIVE — AB
Hepatitis B Surface Ag: REACTIVE — AB

## 2017-07-10 LAB — HEPATITIS B CORE ANTIBODY, IGM: HEP B C IGM: NONREACTIVE

## 2017-07-10 LAB — HEPATITIS B SURFACE ANTIBODY,QUALITATIVE: HEP B S AB: NONREACTIVE

## 2017-07-10 LAB — HEPATITIS B DNA, ULTRAQUANTITATIVE, PCR: Hepatitis B DNA: <10 IU/mL

## 2017-07-10 LAB — HEPATITIS B E ANTIGEN: HEP B E AG: NONREACTIVE

## 2017-07-10 LAB — HEPATITIS B E ANTIBODY: HEP B E AB: REACTIVE — AB

## 2017-07-10 LAB — HEPATITIS B CORE ANTIBODY, TOTAL: HEP B C TOTAL AB: REACTIVE — AB

## 2017-07-11 ENCOUNTER — Telehealth: Payer: Self-pay | Admitting: Behavioral Health

## 2017-07-11 NOTE — Telephone Encounter (Addendum)
Called patient and informed her per Terri Piedra NP that her Hepatitis B is inactive and she does not need treatment at this time.  Informed her that Marya Amsler recommended and office visit in 6 months.  Office visit scheduled for November 2019.  Patient verbalized understanding. Pricilla Riffle RN

## 2017-07-11 NOTE — Telephone Encounter (Signed)
-----   Message from [place], [place] sent at 07/11/2017  8:16 AM EDT ----- Please inform patient that no treatment is needed at this time for her Hepatitis B as it is currently inactive. I would like her to follow up in 6 months for blood work, office visit and ultrasound.

## 2017-07-14 ENCOUNTER — Ambulatory Visit (HOSPITAL_COMMUNITY)
Admission: RE | Admit: 2017-07-14 | Discharge: 2017-07-14 | Disposition: A | Discharge status: Home / Self Care | Payer: Medicare Other | Source: Ambulatory Visit | Attending: Specialist | Admitting: Specialist

## 2017-07-14 ENCOUNTER — Other Ambulatory Visit (INDEPENDENT_AMBULATORY_CARE_PROVIDER_SITE_OTHER): Payer: Self-pay | Admitting: Specialist

## 2017-07-14 ENCOUNTER — Telehealth (INDEPENDENT_AMBULATORY_CARE_PROVIDER_SITE_OTHER): Payer: Self-pay | Admitting: Specialist

## 2017-07-14 DIAGNOSIS — M4804 Spinal stenosis, thoracic region: Secondary | ICD-10-CM | POA: Diagnosis not present

## 2017-07-14 DIAGNOSIS — M545 Low back pain: Secondary | ICD-10-CM | POA: Insufficient documentation

## 2017-07-14 DIAGNOSIS — G8929 Other chronic pain: Secondary | ICD-10-CM | POA: Diagnosis not present

## 2017-07-14 DIAGNOSIS — M48062 Spinal stenosis, lumbar region with neurogenic claudication: Secondary | ICD-10-CM | POA: Diagnosis not present

## 2017-07-14 DIAGNOSIS — Q7649 Other congenital malformations of spine, not associated with scoliosis: Secondary | ICD-10-CM | POA: Insufficient documentation

## 2017-07-14 DIAGNOSIS — Z981 Arthrodesis status: Secondary | ICD-10-CM | POA: Insufficient documentation

## 2017-07-14 LAB — GLUCOSE, CAPILLARY: Glucose-Capillary: 160 mg/dL — ABNORMAL HIGH (ref 65–99)

## 2017-07-14 MED ORDER — HYDROCODONE-ACETAMINOPHEN 5-325 MG PO TABS
ORAL_TABLET | ORAL | Status: AC
  Filled 2017-07-14: qty 1

## 2017-07-14 MED ORDER — IOPAMIDOL (ISOVUE-M 200) INJECTION 41%
20.0000 mL | Freq: Once | INTRAMUSCULAR | Status: AC
  Administered 2017-07-14: 13 mL via INTRATHECAL

## 2017-07-14 MED ORDER — DIAZEPAM 5 MG PO TABS
ORAL_TABLET | ORAL | Status: AC
  Filled 2017-07-14: qty 2

## 2017-07-14 MED ORDER — DIAZEPAM 5 MG PO TABS
10.0000 mg | ORAL_TABLET | Freq: Once | ORAL | Status: AC
  Administered 2017-07-14: 10 mg via ORAL
  Filled 2017-07-14: qty 2

## 2017-07-14 MED ORDER — LIDOCAINE HCL (PF) 1 % IJ SOLN
5.0000 mL | Freq: Once | INTRAMUSCULAR | Status: AC
  Administered 2017-07-14: 10 mL via INTRADERMAL

## 2017-07-14 MED ORDER — HYDROCODONE-ACETAMINOPHEN 5-325 MG PO TABS
1.0000 | ORAL_TABLET | Freq: Once | ORAL | Status: AC
  Administered 2017-07-14: 1 via ORAL

## 2017-07-14 MED ORDER — HYDROCODONE-ACETAMINOPHEN 5-325 MG PO TABS: 1.0000 | ORAL_TABLET | Freq: Four times a day (QID) | ORAL | 0 refills | Status: AC | PRN

## 2017-07-14 NOTE — Telephone Encounter (Signed)
Patient called needing something for pain. Patient said her Kidney doctor and her family doctor at [facility] didn't want her on anything with tylenol or aspirin in it. Patient said she was off Celexa and Buspar  when she had the Myelogram and want to know when she should start taking it again. The number to contact patient is [phone]

## 2017-07-14 NOTE — Procedures (Signed)
Successful LP at L4/5 for lumbar myelogram, see imaging report.  Owens Shark MD

## 2017-07-14 NOTE — Discharge Instructions (Signed)
Myelogram, Care After °Refer to this sheet in the next few weeks. These instructions provide you with information about caring for yourself after your procedure. Your health care provider may also give you more specific instructions. Your treatment has been planned according to current medical practices, but problems sometimes occur. Call your health care provider if you have any problems or questions after your procedure. °What can I expect after the procedure? °After the procedure, it is common to have: °· Soreness at your injection site. °· A mild headache. ° °Follow these instructions at home: °· Drink enough fluid to keep your urine clear or pale yellow. This will help flush out the dye (contrast material) from your spine. °· Rest as told by your health care provider. Lie flat with your head slightly raised (elevated) to reduce the risk of headache. °· Do not bend, lift, or do any strenuous activity for 24-48 hours or as told by your health care provider. °· Take over-the-counter and prescription medicines only as told by your health care provider. °· Take care of and remove your bandage (dressing) as told by your health care provider. °· Bathe or shower as told by your health care provider. °Contact a health care provider if: °· You have a fever. °· You have a headache that lasts longer than 24 hours. °· You feel nauseous or vomit. °· You have a stiff neck or numbness in your legs. °· You are unable to urinate or have a bowel movement. °· You develop a rash, itching, or sneezing. °Get help right away if: °· You have new symptoms or your symptoms get worse. °· You have a seizure. °· You have trouble breathing. °This information is not intended to replace advice given to you by your health care provider. Make sure you discuss any questions you have with your health care provider. °Document Released: 02/10/2015 Document Revised: 06/22/2015 Document Reviewed: 10/27/2014 °Elsevier Interactive Patient Education ©  2018 Elsevier Inc. ° °

## 2017-07-14 NOTE — Telephone Encounter (Signed)
Patient called needing something for pain. Patient said her Kidney doctor and her family doctor at [facility] didn't want her on anything with tylenol or aspirin in it. Patient said she was off Celexa and Buspar  when she had the Myelogram and want to know when she should start taking it again.

## 2017-07-14 NOTE — Telephone Encounter (Signed)
I have reviewed her notes from [facility] and the notes do not indicate that she can not take tylenol. If her Primary care does not want her to have tylenol they should mention it in their notes or send [place] a message. Otherwise  I will not prescribe any thing stronger that what I already have written. jen

## 2017-07-15 NOTE — Telephone Encounter (Signed)
I called and advised on Dr. Otho Ket message and that her rx was ready for pick up at the front desk

## 2017-07-21 ENCOUNTER — Other Ambulatory Visit: Payer: Self-pay | Admitting: Student in an Organized Health Care Education/Training Program

## 2017-07-21 ENCOUNTER — Other Ambulatory Visit (INDEPENDENT_AMBULATORY_CARE_PROVIDER_SITE_OTHER): Payer: Self-pay | Admitting: Specialist

## 2017-07-21 DIAGNOSIS — E8770 Fluid overload, unspecified: Secondary | ICD-10-CM

## 2017-07-21 DIAGNOSIS — R0789 Other chest pain: Secondary | ICD-10-CM

## 2017-07-21 DIAGNOSIS — R600 Localized edema: Secondary | ICD-10-CM

## 2017-07-21 NOTE — Telephone Encounter (Signed)
Baclofen refill request 

## 2017-07-22 ENCOUNTER — Other Ambulatory Visit (INDEPENDENT_AMBULATORY_CARE_PROVIDER_SITE_OTHER): Payer: Self-pay | Admitting: Specialist

## 2017-07-22 MED ORDER — HYDROCODONE-ACETAMINOPHEN 5-325 MG PO TABS: 1.0000 | ORAL_TABLET | Freq: Four times a day (QID) | ORAL | 0 refills | Status: DC | PRN

## 2017-07-22 NOTE — Telephone Encounter (Signed)
Patient requesting rx refill on hydrocodone. Patients # [phone]

## 2017-07-22 NOTE — Telephone Encounter (Signed)
Reviewed [facility] web site and she is only receiving meds for pain from [place], last Rx 6/11. jen

## 2017-07-22 NOTE — Telephone Encounter (Signed)
Sent request to Dr. Nitka 

## 2017-07-22 NOTE — Telephone Encounter (Signed)
Pt is aware her rx is ready for pick up at the front desk 

## 2017-07-22 NOTE — Addendum Note (Signed)
Addended by: Minda Ditto, Geoffery Spruce on: 07/22/2017 03:57 PM   Modules accepted: Orders

## 2017-07-25 ENCOUNTER — Ambulatory Visit (INDEPENDENT_AMBULATORY_CARE_PROVIDER_SITE_OTHER): Payer: Medicare Other | Admitting: Family Medicine

## 2017-07-25 ENCOUNTER — Other Ambulatory Visit: Payer: Self-pay

## 2017-07-25 ENCOUNTER — Encounter: Payer: Self-pay | Admitting: Family Medicine

## 2017-07-25 ENCOUNTER — Other Ambulatory Visit: Payer: Self-pay | Admitting: Family Medicine

## 2017-07-25 DIAGNOSIS — J449 Chronic obstructive pulmonary disease, unspecified: Secondary | ICD-10-CM | POA: Diagnosis not present

## 2017-07-25 DIAGNOSIS — I5032 Chronic diastolic (congestive) heart failure: Secondary | ICD-10-CM | POA: Diagnosis not present

## 2017-07-25 DIAGNOSIS — I10 Essential (primary) hypertension: Secondary | ICD-10-CM | POA: Diagnosis not present

## 2017-07-25 NOTE — Progress Notes (Signed)
   CC: swelling in her legs, SOB   HPI  Wants personal care services for help at home.   Swelling - goes down when she sleeps, comes back up in the day. Pain with swelling. Feels it is somewhat improved today. Takes lasix every day, states she doesn't pee more after this but pees all night every hour after taking bedtime lasix. Says Dr. Shan Osborne said to do lasix 40 mg BID. Wants to go back to 20mg  BID.   SOB - did  pick up trelegy. Feels she is coughing more. States her breathing is not improved when she switched inhalers. Continued sputum production.   HTN - took meds right before arrival today.   ROS: Denies CP, SOB, abdominal pain, dysuria, changes in BMs.   CC, SH/smoking status, and VS noted  Objective: BP (!) 146/78   Pulse (!) 102   Temp 98.4 F (36.9 C) (Oral)   Ht 5\' 10"  (1.778 m)   Wt (!) 392 lb 9.6 oz (178.1 kg)   LMP 04/10/2006   SpO2 (!) 89%   BMI 56.33 kg/m   Recheck 02 sat was 91, goal 89-92 with COPD Gen: NAD, alert, cooperative, and pleasant. HEENT: NCAT, EOMI, PERRL CV: RRR, no murmur Resp: CTAB, no wheezes, non-labored Ext: 1+ woody edema in bilateral LEs.  Neuro: Alert and oriented, Speech clear, No gross deficits  Assessment and plan:  Chronic diastolic CHF (congestive heart failure) No new DOE or worsening SOB. Edema stable per patient. Labs recently checked, she is mostly worried about urinating at night after taking lasix at bedtime. Will decrease to 20mg  BID and use second dose at 1-2pm rather than 8pm. Also asked her to obtain compression stockings.   COPD (chronic obstructive pulmonary disease) with chronic bronchitis (HCC) No signs of acute exacerbation, patient wants to continue current inhaler therapy. Asked her to decrease smoking as this would be most helpful to her breathing.   Hypertension Continue current regimen, recheck in 2 months with PCP. Took meds immediately prior to arrival.    [name], MD, PGY2 07/29/2017 10:30 AM

## 2017-07-25 NOTE — Patient Instructions (Signed)
It was a pleasure to see you today! Thank you for choosing [facility] for your primary care. [name] was seen for breathing, swelling.   Our plans for today were:  Please try to pick up some compression stockings.   Change your lasix to 7am and 1pm so that you won't be up at night.   Call us if your breathing gets worse or your legs swell more, or you gain more 3 pounds in a week.   Please continue to work on your smoking.   Best,  Dr. Lindell Noe

## 2017-07-28 ENCOUNTER — Other Ambulatory Visit: Payer: Self-pay | Admitting: Student in an Organized Health Care Education/Training Program

## 2017-07-29 NOTE — Assessment & Plan Note (Addendum)
No signs of acute exacerbation, patient wants to continue current inhaler therapy. Asked her to decrease smoking as this would be most helpful to her breathing.

## 2017-07-29 NOTE — Assessment & Plan Note (Signed)
Continue current regimen, recheck in 2 months with PCP. Took meds immediately prior to arrival.

## 2017-07-29 NOTE — Assessment & Plan Note (Addendum)
No new DOE or worsening SOB. Edema stable per patient. Labs recently checked, she is mostly worried about urinating at night after taking lasix at bedtime. Will decrease to 20mg  BID and use second dose at 1-2pm rather than 8pm. Also asked her to obtain compression stockings.

## 2017-07-30 ENCOUNTER — Other Ambulatory Visit (INDEPENDENT_AMBULATORY_CARE_PROVIDER_SITE_OTHER): Payer: Self-pay | Admitting: Specialist

## 2017-07-30 ENCOUNTER — Other Ambulatory Visit: Payer: Self-pay | Admitting: Family Medicine

## 2017-07-30 ENCOUNTER — Telehealth: Payer: Self-pay

## 2017-07-30 DIAGNOSIS — G4733 Obstructive sleep apnea (adult) (pediatric): Secondary | ICD-10-CM

## 2017-07-30 NOTE — Telephone Encounter (Signed)
Patient calling needs new DME order entered for a new CPAP MACHINE. Previous orders entered are for supplies only. [facility] has stated new order needed that specifies CPAP MACHINE.  Please let RN clinic know when entered so community message can be sent to [facility].  Danley Danker, RN [facility] RN)

## 2017-07-30 NOTE — Telephone Encounter (Signed)
I placed the order for a new CPAP machine.  Looking through her chart, it appears that a new CPAP has been ordered by Dr. Criss Rosales, Dr. Erin Hearing, and myself earlier this year.  It's possible that we are placing the wrong DME order, but I cannot find any other order that would make sense.  Hopefully she will be able to get her CPAP with this order.  Thanks for your help in sending the community message to [facility].

## 2017-07-30 NOTE — Telephone Encounter (Signed)
Med refill  Hydrocodone

## 2017-08-01 ENCOUNTER — Other Ambulatory Visit (INDEPENDENT_AMBULATORY_CARE_PROVIDER_SITE_OTHER): Payer: Self-pay | Admitting: Specialist

## 2017-08-01 ENCOUNTER — Other Ambulatory Visit: Payer: Self-pay | Admitting: Family Medicine

## 2017-08-01 DIAGNOSIS — G4733 Obstructive sleep apnea (adult) (pediatric): Secondary | ICD-10-CM

## 2017-08-01 MED ORDER — HYDROCODONE-ACETAMINOPHEN 5-325 MG PO TABS: 1.0000 | ORAL_TABLET | Freq: Four times a day (QID) | ORAL | 0 refills | Status: DC | PRN

## 2017-08-01 NOTE — Telephone Encounter (Signed)
Message sent to [facility] to check order and to let us know what else is needed. Will follow up next week if don't hear back today. Danley Danker, RN [facility] RN)

## 2017-08-01 NOTE — Telephone Encounter (Signed)
Baclofen refill request 

## 2017-08-01 NOTE — Telephone Encounter (Signed)
Pt has been advised that her rx is ready for pick up at the front desk.

## 2017-08-04 DIAGNOSIS — N319 Neuromuscular dysfunction of bladder, unspecified: Secondary | ICD-10-CM | POA: Diagnosis not present

## 2017-08-04 DIAGNOSIS — N3000 Acute cystitis without hematuria: Secondary | ICD-10-CM | POA: Diagnosis not present

## 2017-08-04 NOTE — Telephone Encounter (Signed)
Received community message from [facility] that they have all information they need. Danley Danker, RN [facility] RN)

## 2017-08-06 ENCOUNTER — Telehealth: Payer: Self-pay

## 2017-08-06 NOTE — Telephone Encounter (Signed)
Pt called nurse line requesting a refill on Nystatin 500,000 for mouth ulcers. I saw were this medication appeared to be refused by pcp. Please advise.

## 2017-08-07 ENCOUNTER — Other Ambulatory Visit: Payer: Self-pay

## 2017-08-07 MED ORDER — NYSTATIN 100000 UNIT/ML MT SUSP: 5.0000 mL | Freq: Four times a day (QID) | OROMUCOSAL | 1 refills | Status: DC

## 2017-08-07 NOTE — Telephone Encounter (Signed)
Patient called for refill on Nystatin swish for mouth ulcers. Appears to have been denied but reason stated prescriber not at this practice. Last filled in May by Dr. Garlan Fillers. Please clarify.  Call back is [phone]   [facility]  Danley Danker, RN [facility])

## 2017-08-11 ENCOUNTER — Other Ambulatory Visit (INDEPENDENT_AMBULATORY_CARE_PROVIDER_SITE_OTHER): Payer: Self-pay | Admitting: Specialist

## 2017-08-11 DIAGNOSIS — G4733 Obstructive sleep apnea (adult) (pediatric): Secondary | ICD-10-CM | POA: Diagnosis not present

## 2017-08-11 DIAGNOSIS — M5090 Cervical disc disorder, unspecified, unspecified cervical region: Secondary | ICD-10-CM | POA: Diagnosis not present

## 2017-08-11 MED ORDER — HYDROCODONE-ACETAMINOPHEN 5-325 MG PO TABS: 1.0000 | ORAL_TABLET | Freq: Four times a day (QID) | ORAL | 0 refills | Status: DC | PRN

## 2017-08-11 NOTE — Telephone Encounter (Signed)
Sent request to Dr. nitka 

## 2017-08-11 NOTE — Telephone Encounter (Signed)
Patient requesting rx refill on hydrocodone, Patients # [phone]

## 2017-08-12 ENCOUNTER — Other Ambulatory Visit: Payer: Self-pay | Admitting: Student in an Organized Health Care Education/Training Program

## 2017-08-12 DIAGNOSIS — N319 Neuromuscular dysfunction of bladder, unspecified: Secondary | ICD-10-CM | POA: Diagnosis not present

## 2017-08-12 DIAGNOSIS — N3000 Acute cystitis without hematuria: Secondary | ICD-10-CM | POA: Diagnosis not present

## 2017-08-12 NOTE — Telephone Encounter (Signed)
Pt is aware her rx is ready for pick up at the front desk 

## 2017-08-13 ENCOUNTER — Encounter (INDEPENDENT_AMBULATORY_CARE_PROVIDER_SITE_OTHER): Payer: Self-pay | Admitting: Specialist

## 2017-08-13 ENCOUNTER — Ambulatory Visit (INDEPENDENT_AMBULATORY_CARE_PROVIDER_SITE_OTHER): Payer: Medicare Other

## 2017-08-13 ENCOUNTER — Ambulatory Visit (INDEPENDENT_AMBULATORY_CARE_PROVIDER_SITE_OTHER): Payer: Medicare Other | Admitting: Specialist

## 2017-08-13 VITALS — BP 157/85 | HR 101 | Ht 70.0 in | Wt 378.0 lb

## 2017-08-13 DIAGNOSIS — M48062 Spinal stenosis, lumbar region with neurogenic claudication: Secondary | ICD-10-CM

## 2017-08-13 DIAGNOSIS — M542 Cervicalgia: Secondary | ICD-10-CM

## 2017-08-13 DIAGNOSIS — M4722 Other spondylosis with radiculopathy, cervical region: Secondary | ICD-10-CM

## 2017-08-13 DIAGNOSIS — M4322 Fusion of spine, cervical region: Secondary | ICD-10-CM | POA: Diagnosis not present

## 2017-08-13 DIAGNOSIS — Z6841 Body Mass Index (BMI) 40.0 and over, adult: Secondary | ICD-10-CM

## 2017-08-13 MED ORDER — DIAZEPAM 5 MG PO TABS: ORAL_TABLET | ORAL | 0 refills | Status: DC

## 2017-08-13 NOTE — Patient Instructions (Addendum)
Avoid overhead lifting and overhead use of the arms. Do not lift greater than 5 lbs. Adjust head rest in vehicle to prevent hyperextension if rear ended. Take extra precautions to avoid falling. Avoid overhead lifting and overhead use of the arms. Do not lift greater than 5 lbs. Adjust head rest in vehicle to prevent hyperextension if rear ended. Take extra precautions to avoid falling. Call on Monday will increase narcotics to 7.5 hydrocodone if indicated.  Bariatric surgical evaluation as she is high risk for surgery of the lumbar spine due to BMI >45

## 2017-08-13 NOTE — Progress Notes (Addendum)
Office Visit Note   Patient: [name]           Date of Birth: 1965/05/09           MRN: [id number] Visit Date: 08/13/2017              Requested by: [name], MD [address] [place], [place] [address] PCP: [name], MD   Assessment & Plan: Visit Diagnoses:  1. Cervicalgia   2. Fusion of spine of cervical region   3. Other spondylosis with radiculopathy, cervical region   4. Spinal stenosis of lumbar region with neurogenic claudication   5. Body mass index 50.0-59.9, adult (HCC)    Body mass index is 54.24 kg/m. Plan: Avoid overhead lifting and overhead use of the arms. Do not lift greater than 5 lbs. Adjust head rest in vehicle to prevent hyperextension if rear ended. Take extra precautions to avoid falling. Avoid overhead lifting and overhead use of the arms. Do not lift greater than 5 lbs. Adjust head rest in vehicle to prevent hyperextension if rear ended. Take extra precautions to avoid falling.  Follow-Up Instructions: Return in about 2 weeks (around 08/27/2017).   Orders:  Orders Placed This Encounter  Procedures  . XR Cervical Spine 2 or 3 views  . MR Cervical Spine w/o contrast   Meds ordered this encounter  Medications  . diazepam (VALIUM) 5 MG tablet    Sig: Take one tablet at MRI and may repeat x 1  At MRI.    Dispense:  2 tablet    Refill:  0      Procedures: No procedures performed   Clinical Data: No additional findings.   Subjective: Chief Complaint  Patient presents with  . Neck - Pain    Complaining of pain in the right arm with numbness and tingling in fingers  . Lower Back - Follow-up    52 year old female right handed with pain in the low back and now pain in the right neck and right arm. Numbness and tingling into the right ulnar 4 digits. No pain with cough or sneeze. Does have pain with raising the right arm up with pain in the right mid upper arm with hurting to touch the right mid upper arm and biceps area. Pain  with sleeping on the right side. No bowel or bladder difficulty. The right are hurts so bad to raise.    Review of Systems  Constitutional: Negative.   HENT: Negative.   Eyes: Negative.   Respiratory: Negative.   Cardiovascular: Negative.   Gastrointestinal: Negative.   Endocrine: Negative.   Genitourinary: Negative.   Musculoskeletal: Negative.   Skin: Negative.   Allergic/Immunologic: Negative.   Neurological: Negative.   Hematological: Negative.   Psychiatric/Behavioral: Negative.      Objective: Vital Signs: BP (!) 157/85 (BP Location: Left Arm, Patient Position: Sitting)   Pulse (!) 101   Ht 5\' 10"  (1.778 m)   Wt (!) 378 lb (171.5 kg)   LMP 04/10/2006   BMI 54.24 kg/m   Physical Exam  Constitutional: She is oriented to person, place, and time. She appears well-developed and well-nourished.  HENT:  Head: Normocephalic and atraumatic.  Eyes: Pupils are equal, round, and reactive to light. EOM are normal.  Neck: Normal range of motion. Neck supple.  Pulmonary/Chest: Effort normal and breath sounds normal.  Abdominal: Soft. Bowel sounds are normal.  Neurological: She is alert and oriented to person, place, and time.  Skin: Skin  is warm and dry.  Psychiatric: She has a normal mood and affect. Her behavior is normal. Judgment and thought content normal.    Back Exam   Tenderness  The patient is experiencing tenderness in the cervical.  Range of Motion  Extension: abnormal  Flexion: normal  Lateral bend right: abnormal  Lateral bend left: abnormal  Rotation right: abnormal  Rotation left: abnormal   Muscle Strength  Right Quadriceps:  5/5  Left Quadriceps:  5/5  Right Hamstrings:  5/5  Left Hamstrings:  5/5   Tests  Straight leg raise right: negative Straight leg raise left: negative  Other  Toe walk: normal Heel walk: normal Sensation: normal Gait: normal       Specialty Comments:  No specialty comments available.  Imaging: Xr Cervical  Spine 2 Or 3 Views  Result Date: 08/13/2017 AP and lateral flexion and extension radiographs cervical spine shows posterior cervical lateral mass screws and rods C4 to T2, ACDF plate and screws D7-A1 with apparent arthrodesis C4 to T2, no hardware loosening noted. The disc spaces appear fused C4 to C6 and C7 to T1. AP and lateral flexion and extension radiographs cervical spine with posterior lateral mass [place]     PMFS History: Patient Active Problem List   Diagnosis Date Noted  . Hepatitis B surface antigen positive 07/08/2017  . COPD (chronic obstructive pulmonary disease) with chronic bronchitis ([place]) 06/27/2017  . Folliculitis 28/78/6767  . Oral thrush 06/09/2017  . Volume overload 06/06/2017  . Edema of left lower extremity 04/23/2017  . Chronic kidney disease (CKD), stage IV (severe) ([place]) 01/14/2017  . Fracture of fifth toe, left, open, initial encounter 12/18/2016  . Type 2 diabetes mellitus with stage 3 chronic kidney disease, with long-term current use of insulin ([place]) 12/12/2016  . Urge incontinence of urine 12/05/2016  . Substance abuse ([place]) 11/01/2013  . Chronic diastolic CHF (congestive heart failure) ([place]) 04/07/2012  . GERD 01/26/2010  . Hypertension 08/04/2008  . OBSTRUCTIVE SLEEP APNEA 02/03/2008  . FIBROCYSTIC BREAST DISEASE 10/28/2006  . Hyperlipidemia 03/27/2006  . Morbid obesity ([place]) 03/27/2006  . TOBACCO DEPENDENCE 03/27/2006  . Depression with anxiety 03/27/2006  . NEUROGENIC BLADDER 03/27/2006   Past Medical History:  Diagnosis Date  . Arthritis 04-10-11   hips, shoulders, back  . Asthma   . Bipolar disorder ([place])   . Cancer ([employer]) 1993   cervical, no treatment done, went away per pt  . Cervical dysplasia or atypia 04-10-11   '93- once dx.-got pregnant-no intervention, then postpartum, no dysplasia found  . CHF (congestive heart failure) ([place])    no cardiologist 2014 dx, none now  . 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  Pathology showed Condyloma, gluteal CONDYLOMA ACUMINATUM.   [name] Kitchen Dyspnea    with actity - better since taking Singular  . Fall 12/16/2016  . Fibrocystic breast disease   . Hypercalcemia   . Hyperlipidemia   . Hypertension   . Migraine headache    none recent  . Morbid obesity ([place]) 03/27/2006  . Neuropathy   . Skin lesion 03/15/2011   In gluteal crease now s/p removal by Dr. [name] of General Surgery on 3/12. Path shows condyloma.     . Sleep apnea 04-10-11   uses cpap, pt does not know settings  . Trigger finger    left third    Family History  Problem Relation Age of Onset  . Stroke Father   . Cancer Maternal Aunt  breast  . Asthma Other   . COPD Other   . Hypertension Other   . Diabetes Other     Past Surgical History:  Procedure Laterality Date  . ANAL FISTULECTOMY  04/17/2011   Procedure: FISTULECTOMY ANAL;  Surgeon: Imogene Burn. [name], MD;  Location: [facility];  Service: General;  Laterality: N/A;  Excision of Condyloma Gluteal Cleft   . BACK SURGERY  04-10-11   x5-Lumbar fusion-retained hardware.(Dr. Louanne Skye)  . BREAST CYST EXCISION     bilateral breast, 3 cysts removed from each breast  . BREAST EXCISIONAL BIOPSY Right    x 3  . BREAST EXCISIONAL BIOPSY Left    x 3  . CARPAL TUNNEL RELEASE Bilateral   . Cervical biospy    . COLONOSCOPY WITH PROPOFOL N/A 07/06/2015   Procedure: COLONOSCOPY WITH PROPOFOL;  Surgeon: [name], MD;  Location: [facility];  Service: Endoscopy;  Laterality: N/A;  . DOPPLER ECHOCARDIOGRAPHY  04/06/2012   AT [place] 55-60%  . I&D EXTREMITY Left 12/18/2016   Procedure: IRRIGATION AND DEBRIDEMENT LEFT FOOT, CLOSURE;  Surgeon: [name], MD;  Location: [facility];  Service: Orthopedics;  Laterality: Left;  [name] Kitchen MULTIPLE TOOTH EXTRACTIONS    . NECK SURGERY  04-10-11   x3- cervical fusion with plating and screws-Dr. [name]  . TEE WITHOUT CARDIOVERSION N/A 04/06/2012   Procedure: TRANSESOPHAGEAL ECHOCARDIOGRAM (TEE);  Surgeon: [name], MD;  Location: [facility];  Service: Cardiovascular;  Laterality: N/A;  . TRIGGER FINGER RELEASE Right    middle finger  . TRIGGER FINGER RELEASE Left 06/28/2016   Procedure: RELEASE TRIGGER FINGER LEFT 3RD FINGER;  Surgeon: [name], MD;  Location: [facility];  Service: Orthopedics;  Laterality: Left;  . TRIGGER FINGER RELEASE Right 06/12/2016   Procedure: RIGHT INDEX FINGER TRIGGER RELEASE;  Surgeon: [name], MD;  Location: [facility];  Service: Orthopedics;  Laterality: Right;   Social History   Occupational History  . Not on file  Tobacco Use  . Smoking status: Current Every Day Smoker    Packs/day: 0.50    Years: 39.00    Pack years: 19.50    Types: Cigarettes    Start date: 01/28/1977  . Smokeless tobacco: Never Used  . Tobacco comment: Previous 1 ppd,  Quit during hospit. 12/16/2016  Substance and Sexual Activity  . Alcohol use: No    Alcohol/week: 0.0 oz  . Drug use: Not Currently    Types: Marijuana, "Crack" cocaine    Comment: hx marijuana usemany years ago, Crack -none since 11/2015  . Sexual activity: Yes    Partners: Male

## 2017-08-14 ENCOUNTER — Other Ambulatory Visit: Payer: Self-pay | Admitting: Family Medicine

## 2017-08-14 DIAGNOSIS — E8770 Fluid overload, unspecified: Secondary | ICD-10-CM

## 2017-08-14 DIAGNOSIS — R600 Localized edema: Secondary | ICD-10-CM

## 2017-08-14 DIAGNOSIS — R0789 Other chest pain: Secondary | ICD-10-CM

## 2017-08-18 ENCOUNTER — Other Ambulatory Visit (INDEPENDENT_AMBULATORY_CARE_PROVIDER_SITE_OTHER): Payer: Self-pay | Admitting: Specialist

## 2017-08-18 NOTE — Telephone Encounter (Signed)
Med refill Hydrocodone * Change Dosage*

## 2017-08-19 ENCOUNTER — Other Ambulatory Visit: Payer: Self-pay | Admitting: Student in an Organized Health Care Education/Training Program

## 2017-08-19 ENCOUNTER — Ambulatory Visit: Payer: Medicare Other | Admitting: Family Medicine

## 2017-08-19 ENCOUNTER — Other Ambulatory Visit (INDEPENDENT_AMBULATORY_CARE_PROVIDER_SITE_OTHER): Payer: Self-pay | Admitting: Specialist

## 2017-08-19 MED ORDER — HYDROCODONE-ACETAMINOPHEN 5-325 MG PO TABS: 1.0000 | ORAL_TABLET | Freq: Four times a day (QID) | ORAL | 0 refills | Status: DC | PRN

## 2017-08-19 NOTE — Telephone Encounter (Signed)
Baclofen refill request 

## 2017-08-20 NOTE — Telephone Encounter (Signed)
Patient is aware her rx is ready for pick up at the front desk

## 2017-08-23 ENCOUNTER — Other Ambulatory Visit: Payer: Self-pay | Admitting: Family Medicine

## 2017-08-25 ENCOUNTER — Other Ambulatory Visit (INDEPENDENT_AMBULATORY_CARE_PROVIDER_SITE_OTHER): Payer: Self-pay | Admitting: Specialist

## 2017-08-25 NOTE — Telephone Encounter (Signed)
Patient called advised Dr Louanne Skye was going to up her Hydrocodone to 7.5. Patient advised she will bring in the check out sheet when she come to pick up Rx. The number to contact patient is [phone]

## 2017-08-25 NOTE — Telephone Encounter (Signed)
Request for Hydrocodone 7.5 mg.  It is stated in her last note that the Hydrocodone would be increased to 7.5 mg if indicated.  Please advise.

## 2017-08-26 ENCOUNTER — Other Ambulatory Visit: Payer: Self-pay

## 2017-08-26 ENCOUNTER — Ambulatory Visit (INDEPENDENT_AMBULATORY_CARE_PROVIDER_SITE_OTHER): Payer: Medicare Other | Admitting: Student in an Organized Health Care Education/Training Program

## 2017-08-26 ENCOUNTER — Encounter: Payer: Self-pay | Admitting: Student in an Organized Health Care Education/Training Program

## 2017-08-26 VITALS — BP 148/76 | HR 98 | Temp 98.5°F | Ht 70.0 in | Wt 393.0 lb

## 2017-08-26 DIAGNOSIS — I509 Heart failure, unspecified: Secondary | ICD-10-CM | POA: Diagnosis not present

## 2017-08-26 DIAGNOSIS — Z0000 Encounter for general adult medical examination without abnormal findings: Secondary | ICD-10-CM | POA: Diagnosis not present

## 2017-08-26 NOTE — Patient Instructions (Signed)
It was a pleasure seeing you today in our clinic.  Here is the treatment plan we have discussed and agreed upon together:  Our clinic's number is [phone]. Please call with questions or concerns about what we discussed today.  Be well, Dr. Burr Medico  Sign up for My Chart to have easy access to your labs results, and communication with your primary care physician.

## 2017-08-26 NOTE — Progress Notes (Signed)
   CC: Follow up LE edema/CHF  HPI: [name] is a 52 y.o. female with PMH significant for CHF, COPD, T2DM, CKD who presents to [facility] today for follow up swelling.  CHF follow up - patient reports this is much improved since previous. She reports she takes 20 mg lasix twice daily in the morning and after lunch and she has good UOP on this dose. She reports her LE edema has improved. She has been using compression stockings which have helped. She reports when she lies on her right side she still notices right sided upper extremity edema. For her breathing, she reports improvement though she still feels she has a chronic cough associated with smoking.  Hx of gout on HCTZ Patient reports that although gout is on her problem list, she has never had a gouty flare. She denies any history red swollen joint in the past. She reports that until recently she did not even realize she carried this diagnosis. She has been stable on HCTZ for "years" and her BP is at goal today. She reports that despite increased risk for gout on HCTZ she would elect to stay on this medication. She does have a hx of elevated uric acid in the chart. She is on allopurinol.  Health maintenance - Patient asks for a letter for her dentist to undergo local anesthesia.  - She additionally asks for DME script for a new CPAP mask.  - She is interested in setting up an appointment with Dr. Valentina Lucks for tobacco cessation counseling.  Review of Symptoms:  See HPI for ROS.   CC, SH/smoking status, and VS noted.  Objective: BP (!) 148/76   Pulse 98   Temp 98.5 F (36.9 C) (Oral)   Ht 5\' 10"  (1.778 m)   Wt (!) 393 lb (178.3 kg)   LMP 04/10/2006   SpO2 92%   BMI 56.39 kg/m  GEN: NAD, alert, cooperative, and pleasant. RESPIRATORY: clear to auscultation bilaterally with no wheezes, rhonchi or rales, good effort CV: RRR, no m/r/g, 0-1+ LE edema  SKIN: warm and dry, +chronic skin changes noted over bilateral LE NEURO: II-XII  grossly intact PSYCH: AAOx3, appropriate affect  Assessment and plan:  CHF/LE edema follow up - patient appears euvolemic at today's visit. Continue current management with lasix BID. She can take an additional dose of 20 mg lasix at night on days that she notices worsening swelling. ALso advised her to start recording her weights each morning as this can help with tracking volume status. She is agreeable to this plan.  HCTZ Risk/benefit discussion and shared decision making. Continue HCTZ for now despite gout diagnosis. If she has a gout flare we can reconsider.   Health Maintenance - dentist note provided - DME order for new CPAP mask provided - asked pt to make appt with Dr. Valentina Lucks for tobacco cessation  [name], MD,MS,  PGY3 08/26/2017 8:43 AM

## 2017-08-28 ENCOUNTER — Other Ambulatory Visit (INDEPENDENT_AMBULATORY_CARE_PROVIDER_SITE_OTHER): Payer: Self-pay | Admitting: Specialist

## 2017-08-28 NOTE — Telephone Encounter (Signed)
Baclofen refill request 

## 2017-08-29 ENCOUNTER — Telehealth (INDEPENDENT_AMBULATORY_CARE_PROVIDER_SITE_OTHER): Payer: Self-pay | Admitting: Specialist

## 2017-08-29 MED ORDER — HYDROCODONE-ACETAMINOPHEN 5-325 MG PO TABS: 1.0000 | ORAL_TABLET | Freq: Four times a day (QID) | ORAL | 0 refills | Status: DC | PRN

## 2017-08-29 NOTE — Telephone Encounter (Signed)
Patient called left vm in regards to refill she requested.  She is requesting a call back on the status. I advised it has been sent to Dr Louanne Skye and awaiting reply.  Please call patient to advise.  [phone]

## 2017-08-29 NOTE — Telephone Encounter (Signed)
Patient called to request refill. Wants o know when she can pick up RX.  Please call patient [phone]

## 2017-08-29 NOTE — Telephone Encounter (Signed)
Patient called stating that she needs her Hydrocodone refilled today.  Thank you.

## 2017-08-29 NOTE — Telephone Encounter (Signed)
Sent request to Dr. Nitka 

## 2017-09-01 ENCOUNTER — Ambulatory Visit (INDEPENDENT_AMBULATORY_CARE_PROVIDER_SITE_OTHER): Payer: Medicare Other | Admitting: Pharmacist

## 2017-09-01 ENCOUNTER — Encounter: Payer: Self-pay | Admitting: Pharmacist

## 2017-09-01 DIAGNOSIS — F17200 Nicotine dependence, unspecified, uncomplicated: Secondary | ICD-10-CM

## 2017-09-01 MED ORDER — VARENICLINE TARTRATE 0.5 MG PO TABS: 0.5000 mg | ORAL_TABLET | Freq: Two times a day (BID) | ORAL | 3 refills | Status: DC

## 2017-09-01 NOTE — Telephone Encounter (Signed)
Pt is aware her rx is ready for pick up at the front desk 

## 2017-09-01 NOTE — Progress Notes (Addendum)
S:  Patient arrives in good spirits ambulating without assistance.   Patient arrives for evaluation/assistance with tobacco dependence.  Patient was referred by Dr. Burr Medico on 08/26/17.  Last PCP visit 08/26/17. Last Rx Clinic visit 12/23/16 - at that time patient was seen after her hospitalization on 12/12/17 with multiple fractures in her left foot which required surgery on 12/16/16.    Age when started using tobacco on a daily basis 32. Number of cigarettes/day 20 (up from 10). Estimated nicotine content per cigarette (mg) 1.  Estimated nicotine intake per day 20mg .   Denies waking to smoke, Smokes first cigarette <30min after waking.   Patient reports falling in 11/2017 which led to a broken toe and a hospital visit. Patient quit smoking after hospitalization but restarted a week later after running out of her pain medication. Patient reports she is smoking more than usual because of the increased pain she has been experiencing. Patient has been using the Nicotine 21 mg patches but they are a different brand than what she is used to, which she claims do not work as well as the previous patches she was on. This has also led her to smoke more cigarettes per day.   Patient seems very motivated to quit. She explains that she knows her breathing difficulty and coughing will improve when she quits smoking.  Patient reports trying Chantix in the past which helped her quit smoking but she reports hallucinating/ mental status changes while on it. She states she would be okay with restarting Chantix to help her quit smoking.   Fagerstrom Score Question Scoring Patient Score  How soon after waking do you smoke your first cigarette? <5 mins (3) 5-30 mins (2) 31-60 mins (1) >60 mins (0) 3          How many cigarettes do you smoke/day? 10 or less (0) 11-20 (1) 21-30 (2) >30 (3) 1  Do you smoke more during the first few hours after waking? Yes (1) No (0) 1       Total Score 5  Score interpretation:  low 1-2, low-to-moderate 3-4, moderate 5-7, high >7  Most recent quit attempt: November 2019 Longest time ever been tobacco free one week.  Medications used in past cessation efforts include: Nicotine patch 21 mg currently. Patient states she also has tried Chantix in the past with success of quitting, however experienced mental status changes which led her to discontinue therapy.   Rates IMPORTANCE of quitting tobacco on 1-10 scale of 10 Rates CONFIDENCE of quitting tobacco on 1-10 scale of 10 with starting Chantix.  Motivation to quit: Patient seems very motivated to quit. She states she wants to be able to breath better and not cough as much. She understands the dangers of smoking and wants to try Chantix to help her quit smoking. She set a quit date of 8/10 and wrote it on her calendar during the visit.  -Patient reports compliance to Lantus 20 units daily and Humalog 3 units TID.  7 day average: 141 Highest number reported by patient: 250   PE:   Right upper arm was swollen and tender with small 10cm healing bruise from trauma.  Lower extremity evaluation revealed no edema.    A/P: Tobacco use disorder with severe nicotine dependence of 40 years duration in a patient who is good candidate for success because of motivation to quit and newly initiated Chantix therapy at this visit.    -Will discontinue Nicotine patch therapy. -Initiated varenicline titration of 0.5  mg by mouth once daily with food x3 days, then 0.5 mg by mouth twice daily with food. Patient counseled on purpose, proper use, and potential adverse effects, including GI upset, and potential change in mood. Educated patient to call and report and changed in mental status or any other side effects she experiences on Chantix.  -Patient set a quit date of 09/06/2017  Written information provided.  F/U Rx Clinic Visit  2-4 weeks.  Total time in face-to-face counseling 40 minutes.  Patient seen with Sharyne Peach, PharmD  Candidate, Gwenlyn Found, PharmD, PGY1 Pharmacy Resident.    Called [facility] to clarify that once daily for 3 days then increase to BID dosing was the intent of the prescription for Chanitx.

## 2017-09-01 NOTE — Patient Instructions (Addendum)
It was nice seeing you today!  1. Will start Chantix 0.5 mg once daily for 3 days, then twice daily.  2. Stop Nicotine patches 3. Quit date has been set for 09/06/2017. 4. Will follow up in 2-4 weeks to be decided at next week visit with Dr. Burr Medico.

## 2017-09-01 NOTE — Assessment & Plan Note (Signed)
Tobacco use disorder with severe nicotine dependence of 40 years duration in a patient who is good candidate for success because of motivation to quit and newly initiated Chantix therapy at this visit.    -Will discontinue Nicotine patch therapy. -Initiated varenicline titration of 0.5 mg by mouth once daily with food x3 days, then 0.5 mg by mouth twice daily with food. Patient counseled on purpose, proper use, and potential adverse effects, including GI upset, and potential change in mood. Educated patient to call and report and changed in mental status or any other side effects she experiences on Chantix.  -Patient set a quit date of 09/06/2017

## 2017-09-03 ENCOUNTER — Ambulatory Visit: Payer: Medicare Other | Admitting: Podiatry

## 2017-09-04 ENCOUNTER — Ambulatory Visit
Admission: RE | Admit: 2017-09-04 | Discharge: 2017-09-04 | Disposition: A | Discharge status: Home / Self Care | Payer: Medicare Other | Source: Ambulatory Visit | Attending: Specialist | Admitting: Specialist

## 2017-09-04 DIAGNOSIS — M5412 Radiculopathy, cervical region: Secondary | ICD-10-CM | POA: Diagnosis not present

## 2017-09-04 DIAGNOSIS — M542 Cervicalgia: Secondary | ICD-10-CM

## 2017-09-09 ENCOUNTER — Ambulatory Visit (INDEPENDENT_AMBULATORY_CARE_PROVIDER_SITE_OTHER): Payer: Medicare Other | Admitting: Student in an Organized Health Care Education/Training Program

## 2017-09-09 ENCOUNTER — Encounter: Payer: Self-pay | Admitting: Student in an Organized Health Care Education/Training Program

## 2017-09-09 ENCOUNTER — Other Ambulatory Visit: Payer: Self-pay

## 2017-09-09 VITALS — BP 134/70 | HR 80 | Temp 98.8°F | Ht 69.0 in | Wt >= 6400 oz

## 2017-09-09 DIAGNOSIS — Z794 Long term (current) use of insulin: Secondary | ICD-10-CM

## 2017-09-09 DIAGNOSIS — F418 Other specified anxiety disorders: Secondary | ICD-10-CM | POA: Diagnosis not present

## 2017-09-09 DIAGNOSIS — E1122 Type 2 diabetes mellitus with diabetic chronic kidney disease: Secondary | ICD-10-CM | POA: Diagnosis not present

## 2017-09-09 DIAGNOSIS — N183 Chronic kidney disease, stage 3 unspecified: Secondary | ICD-10-CM

## 2017-09-09 LAB — POCT GLYCOSYLATED HEMOGLOBIN (HGB A1C): HbA1c, POC (controlled diabetic range): 7.4 % — AB (ref 0.0–7.0)

## 2017-09-09 MED ORDER — CITALOPRAM HYDROBROMIDE 40 MG PO TABS: 40.0000 mg | ORAL_TABLET | Freq: Every day | ORAL | 3 refills | Status: DC

## 2017-09-09 NOTE — Assessment & Plan Note (Signed)
A1c worsened this month in setting of increased consumption from depressive episode as well as quitting smoking. Blood sugars have been running around 140-160, however she has been high near 200-300 over last couple of days due to binge eating. - increase lantus to 21u qAM - continue humalog TID - see management for depression

## 2017-09-09 NOTE — Progress Notes (Signed)
   CC: diabetes and depression  HPI: [name] is a 52 y.o. female who presents for diabetes and depression  DIABETES Disease Monitoring: 7.0 on 5/20, now 7.4. Patient reports she has been eating more, particularly at night, in the setting of quitting cigarettes. Eating habits have also been affected by a recent depressive episode. Blood Sugar ranges- 300-200 in setting of recent 140,165  Polyuria/phagia/dipsia- none       Visual problems- none Medications: Lantus 20 qAM, humalog 3u TID Compliance- 100% reported  Hypoglycemic symptoms-  94 was lowest blood sugar she has seen in the last month, no symptoms   Depression Patient feels she has been more depressed lately. She just does not feel like being around anybody. She feels she is eating more nighttime snacks. She feels she has low energy. She does not have any suicidal or homicidal ideation.  She has been taking her citalopram 20 mg daily.   Monitoring Labs and Parameters Last A1C:  Lab Results  Component Value Date   HGBA1C 7.4 (A) 09/09/2017   Review of Symptoms:  See HPI for ROS.   CC, SH/smoking status, and VS noted.  Objective: BP 134/70   Pulse 80   Temp 98.8 F (37.1 C) (Oral)   Ht 5\' 9"  (1.753 m)   Wt (!) 402 lb 3.2 oz (182.4 kg)   LMP 04/10/2006   SpO2 93%   BMI 59.39 kg/m  GEN: NAD, alert, cooperative, and pleasant. 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 Blood sugars have been running around 140-160, however she has been high near 200-300 over last couple of days due to binge eating. -  increase lantus to 21u qAM - continue humalog TID - see management for depression  Depression with anxiety Currently in a depressive episode. No suicidal ideation. Likely contributing to worsened A1c.  - increase citalopram to 40 mg daily - follow up in 2 weeks or sooner as needed   Orders Placed This Encounter  Procedures  . Ambulatory referral to Cardiology    Referral Priority:   Routine    Referral Type:   Consultation    Referral Reason:   Specialty Services Required    Requested Specialty:   Cardiology    Number of Visits Requested:   1  . HgB A1c    Meds ordered this encounter  Medications  . citalopram (CELEXA) 40 MG tablet    Sig: Take 1 tablet (40 mg total) by mouth daily.    Dispense:  30 tablet    Refill:  3     [name], MD,MS,  PGY3 09/09/2017 10:03 PM

## 2017-09-09 NOTE — Assessment & Plan Note (Signed)
Currently in a depressive episode. No suicidal ideation. Likely contributing to worsened A1c.  - increase citalopram to 40 mg daily - follow up in 2 weeks or sooner as needed

## 2017-09-10 ENCOUNTER — Encounter (INDEPENDENT_AMBULATORY_CARE_PROVIDER_SITE_OTHER): Payer: Self-pay | Admitting: Specialist

## 2017-09-10 ENCOUNTER — Ambulatory Visit (INDEPENDENT_AMBULATORY_CARE_PROVIDER_SITE_OTHER): Payer: Medicare Other | Admitting: Specialist

## 2017-09-10 VITALS — BP 164/84 | HR 77 | Ht 70.0 in | Wt 378.0 lb

## 2017-09-10 DIAGNOSIS — M47812 Spondylosis without myelopathy or radiculopathy, cervical region: Secondary | ICD-10-CM | POA: Diagnosis not present

## 2017-09-10 DIAGNOSIS — G8929 Other chronic pain: Secondary | ICD-10-CM | POA: Diagnosis not present

## 2017-09-10 DIAGNOSIS — M542 Cervicalgia: Secondary | ICD-10-CM

## 2017-09-10 DIAGNOSIS — M48062 Spinal stenosis, lumbar region with neurogenic claudication: Secondary | ICD-10-CM | POA: Diagnosis not present

## 2017-09-10 DIAGNOSIS — M961 Postlaminectomy syndrome, not elsewhere classified: Secondary | ICD-10-CM | POA: Diagnosis not present

## 2017-09-10 MED ORDER — HYDROCODONE-ACETAMINOPHEN 7.5-325 MG PO TABS: 1.0000 | ORAL_TABLET | Freq: Four times a day (QID) | ORAL | 0 refills | Status: DC | PRN

## 2017-09-10 NOTE — Patient Instructions (Signed)
Avoid overhead lifting and overhead use of the arms. Do not lift greater than 5 lbs. Adjust head rest in vehicle to prevent hyperextension if rear ended. Take extra precautions to avoid falling. Avoid bending, stooping and avoid lifting weights greater than 10 lbs. Avoid prolong standing and walking. Avoid frequent bending and stooping  No lifting greater than 10 lbs. May use ice or moist heat for pain. Weight loss is of benefit. Handicap license is approved. We will refer you to Dr. Myles Rosenthal for pain management and also for cervical facet blocks. Dx: post cervical laminectomy syndrome. Spondylosis, CRF Class 3.  Renew hydrocodone as you can not take NSAIDs due to renal disease.

## 2017-09-10 NOTE — Progress Notes (Signed)
Office Visit Note   Patient: [name]           Date of Birth: [birth year]           MRN: [id number] Visit Date: 09/10/2017              Requested by: [name], MD [address] [place], [place] [address] PCP: [name], MD   Assessment & Plan: Visit Diagnoses:  1. Cervicalgia   2. Spondylosis without myelopathy or radiculopathy, cervical region   3. Spinal stenosis of lumbar region with neurogenic claudication   4. Other chronic pain   5. Cervical post-laminectomy syndrome     Plan: Avoid overhead lifting and overhead use of the arms. Do not lift greater than 5 lbs. Adjust head rest in vehicle to prevent hyperextension if rear ended. Take extra precautions to avoid falling. Avoid bending, stooping and avoid lifting weights greater than 10 lbs. Avoid prolong standing and walking. Avoid frequent bending and stooping  No lifting greater than 10 lbs. May use ice or moist heat for pain. Weight loss is of benefit. Handicap license is approved. We will refer you to Dr. Myles Rosenthal for pain management and also for cervical facet blocks. Dx: post cervical laminectomy syndrome. Spondylosis, CRF Class 3.  Renew hydrocodone as you can not take NSAIDs due to renal disease.   Follow-Up Instructions: Return in about 4 weeks (around 10/08/2017).   Orders:  Orders Placed This Encounter  Procedures  . Ambulatory referral to Anesthesiology   Meds ordered this encounter  Medications  . DISCONTD: HYDROcodone-acetaminophen (NORCO) 7.5-325 MG tablet    Sig: Take 1-2 tablets by mouth every 6 (six) hours as needed for moderate pain or severe pain.    Dispense:  50 tablet    Refill:  0  . DISCONTD: HYDROcodone-acetaminophen (NORCO) 7.5-325 MG tablet    Sig: Take 1-2 tablets by mouth every 6 (six) hours as needed for moderate pain or severe pain.    Dispense:  50 tablet    Refill:  0      Procedures: No procedures performed   Clinical Data: No additional  findings.   Subjective: Chief Complaint  Patient presents with  . Neck - Follow-up    MRI review    52 year old female right handed, with history of previous C7-T1 ACDF and posterior laminectomy with fusion with instrumentation C3 to C7. She is experiencing increasing pain in the left posterior neck into the left side of her head and scalp and deep in the left occipital parietal area. The results of recent cervical MRI shows no nerve compression at any Level centrally, there is mild to moderate foramenal narrowing C6-7. She has a solid fusion C3 to T2 and mild to moderate arthrosis changes C2-3 and C1-2. Her pain is primarily in her neck.   Review of Systems  Constitutional: Negative.   HENT: Negative.   Eyes: Negative.   Respiratory: Negative.   Cardiovascular: Negative.   Gastrointestinal: Negative.   Endocrine: Negative.   Genitourinary: Negative.   Musculoskeletal: Negative.   Skin: Negative.   Allergic/Immunologic: Negative.   Neurological: Negative.   Hematological: Negative.   Psychiatric/Behavioral: Negative.      Objective: Vital Signs: BP (!) 164/84 (BP Location: Left Arm, Patient Position: Sitting)   Pulse 77   Ht 5\' 10"  (1.778 m)   Wt (!) 378 lb (171.5 kg)   LMP 04/10/2006   BMI 54.24 kg/m   Physical Exam  Constitutional: She is oriented  to person, place, and time. She appears well-developed and well-nourished.  HENT:  Head: Normocephalic and atraumatic.  Eyes: Pupils are equal, round, and reactive to light. EOM are normal.  Neck: Normal range of motion. Neck supple.  Pulmonary/Chest: Effort normal and breath sounds normal.  Abdominal: Soft. Bowel sounds are normal.  Neurological: She is alert and oriented to person, place, and time.  Skin: Skin is warm and dry.  Psychiatric: She has a normal mood and affect. Her behavior is normal. Judgment and thought content normal.    Back Exam   Tenderness  The patient is experiencing tenderness in the  cervical and lumbar.  Range of Motion  Extension: abnormal  Flexion: abnormal  Lateral bend right: abnormal  Lateral bend left: abnormal  Rotation right: abnormal  Rotation left: abnormal   Muscle Strength  Right Quadriceps:  5/5  Left Quadriceps:  5/5  Right Hamstrings:  5/5  Left Hamstrings:  5/5   Tests  Straight leg raise right: positive Straight leg raise left: positive  Reflexes  Patellar: normal Achilles: normal Biceps: normal Babinski's sign: normal   Other  Toe walk: normal Heel walk: normal Sensation: normal Gait: normal  Erythema: no back redness Scars: absent      Specialty Comments:  No specialty comments available.  Imaging: No results found.   PMFS History: Patient Active Problem List   Diagnosis Date Noted  . Hepatitis B surface antigen positive 07/08/2017  . COPD (chronic obstructive pulmonary disease) with chronic bronchitis ([place]) 06/27/2017  . Chronic kidney disease (CKD), stage IV (severe) ([place]) 01/14/2017  . Type 2 diabetes mellitus with stage 3 chronic kidney disease, with long-term current use of insulin ([place]) 12/12/2016  . Urge incontinence of urine 12/05/2016  . Chronic diastolic CHF (congestive heart failure) ([place]) 04/07/2012  . GERD 01/26/2010  . Hypertension 08/04/2008  . OBSTRUCTIVE SLEEP APNEA 02/03/2008  . FIBROCYSTIC BREAST DISEASE 10/28/2006  . Hyperlipidemia 03/27/2006  . Morbid obesity ([place]) 03/27/2006  . TOBACCO DEPENDENCE 03/27/2006  . Depression with anxiety 03/27/2006  . NEUROGENIC BLADDER 03/27/2006   Past Medical History:  Diagnosis Date  . Arthritis 04-10-11   hips, shoulders, back  . Asthma   . Bipolar disorder ([place])   . Cancer ([place]) 1993   cervical, no treatment done, went away per pt  . Cervical dysplasia or atypia 04-10-11   '93- once dx.-got pregnant-no intervention, then postpartum, no dysplasia found  . CHF (congestive heart failure) ([place])    no cardiologist 2014 dx, none now  . Condyloma -  gluteal cleft 04/09/2011   Removed by general surgery. Pathology showed Condyloma, gluteal CONDYLOMA ACUMINATUM.   [name] Kitchen Dyspnea    with actity - better since taking Singular  . Fall 12/16/2016  . Fibrocystic breast disease   . Hypercalcemia   . Hyperlipidemia   . Hypertension   . Migraine headache    none recent  . Morbid obesity ([place]) 03/27/2006  . Neuropathy   . Skin lesion 03/15/2011   In gluteal crease now s/p removal by Dr. [name] of General Surgery on 3/12. Path shows condyloma.     . Sleep apnea 04-10-11   uses cpap, pt does not know settings  . Trigger finger    left third    Family History  Problem Relation Age of Onset  . Stroke Father   . Cancer Maternal Aunt        breast  . Asthma Other   . COPD Other   . Hypertension Other   .  Diabetes Other     Past Surgical History:  Procedure Laterality Date  . ANAL FISTULECTOMY  04/17/2011   Procedure: FISTULECTOMY ANAL;  Surgeon: Imogene Burn. [name], MD;  Location: [facility];  Service: General;  Laterality: N/A;  Excision of Condyloma Gluteal Cleft   . BACK SURGERY  04-10-11   x5-Lumbar fusion-retained hardware.(Dr. Louanne Skye)  . BREAST CYST EXCISION     bilateral breast, 3 cysts removed from each breast  . BREAST EXCISIONAL BIOPSY Right    x 3  . BREAST EXCISIONAL BIOPSY Left    x 3  . CARPAL TUNNEL RELEASE Bilateral   . Cervical biospy    . COLONOSCOPY WITH PROPOFOL N/A 07/06/2015   Procedure: COLONOSCOPY WITH PROPOFOL;  Surgeon: [name], MD;  Location: [facility];  Service: Endoscopy;  Laterality: N/A;  . DOPPLER ECHOCARDIOGRAPHY  04/06/2012   AT [facility] 55-60%  . I&D EXTREMITY Left 12/18/2016   Procedure: IRRIGATION AND DEBRIDEMENT LEFT FOOT, CLOSURE;  Surgeon: [name], MD;  Location: [facility];  Service: Orthopedics;  Laterality: Left;  [name] Kitchen MULTIPLE TOOTH EXTRACTIONS    . NECK SURGERY  04-10-11   x3- cervical fusion with plating and screws-Dr. [name]  . TEE WITHOUT CARDIOVERSION N/A 04/06/2012   Procedure:  TRANSESOPHAGEAL ECHOCARDIOGRAM (TEE);  Surgeon: [name], MD;  Location: [facility];  Service: Cardiovascular;  Laterality: N/A;  . TRIGGER FINGER RELEASE Right    middle finger  . TRIGGER FINGER RELEASE Left 06/28/2016   Procedure: RELEASE TRIGGER FINGER LEFT 3RD FINGER;  Surgeon: [name], MD;  Location: [facility];  Service: Orthopedics;  Laterality: Left;  . TRIGGER FINGER RELEASE Right 06/12/2016   Procedure: RIGHT INDEX FINGER TRIGGER RELEASE;  Surgeon: [name], MD;  Location: [facility];  Service: Orthopedics;  Laterality: Right;   Social History   Occupational History  . Not on file  Tobacco Use  . Smoking status: Former Smoker    Packs/day: 1.00    Years: 39.00    Pack years: 39.00    Types: Cigarettes    Last attempt to quit: 09/07/2017    Years since quitting: 0.0  . Smokeless tobacco: Never Used  . Tobacco comment: Previous 1 ppd,  Quit during hospit. 12/16/2016  Substance and Sexual Activity  . Alcohol use: No    Alcohol/week: 0.0 standard drinks  . Drug use: Not Currently    Types: Marijuana, "Crack" cocaine    Comment: hx marijuana usemany years ago, Crack -none since 11/2015  . Sexual activity: Yes    Partners: Male

## 2017-09-11 ENCOUNTER — Telehealth: Payer: Self-pay | Admitting: *Deleted

## 2017-09-11 DIAGNOSIS — G4733 Obstructive sleep apnea (adult) (pediatric): Secondary | ICD-10-CM | POA: Diagnosis not present

## 2017-09-11 NOTE — Telephone Encounter (Signed)
Pt declined an AVS at her appt. She now is requesting one.  Since an AVS was not created at visit will put the info in a letter for her to pick up. Mahkai Fangman, Salome Spotted, CMA

## 2017-09-12 NOTE — Telephone Encounter (Signed)
AVS written and routed to p [facility] admin to be mailed to the patient

## 2017-09-12 NOTE — Patient Instructions (Signed)
It was a pleasure seeing you today in our clinic. Here is the treatment plan we have discussed and agreed upon together:   Please increase your lantus to 21 units each morning and continue to take your humalong three times daily.  We increased your citalopram to 40 mg daily.  Please follow up In 2 weeks.   Our clinic's number is [phone]. Please call with questions or concerns about what we discussed today.  Be well, Dr. Burr Medico

## 2017-09-15 ENCOUNTER — Other Ambulatory Visit (INDEPENDENT_AMBULATORY_CARE_PROVIDER_SITE_OTHER): Payer: Self-pay | Admitting: Specialist

## 2017-09-15 MED ORDER — HYDROCODONE-ACETAMINOPHEN 7.5-325 MG PO TABS: 1.0000 | ORAL_TABLET | Freq: Four times a day (QID) | ORAL | 0 refills | Status: DC | PRN

## 2017-09-15 NOTE — Telephone Encounter (Signed)
Patient requesting rx refill on hydrocodone. Patients # [phone]

## 2017-09-15 NOTE — Telephone Encounter (Signed)
Baclofen refill request 

## 2017-09-15 NOTE — Telephone Encounter (Signed)
Sent request to Dr. Nitka 

## 2017-09-16 NOTE — Telephone Encounter (Signed)
Pt is aware her rx is ready for pick up at the front desk 

## 2017-09-18 ENCOUNTER — Encounter (INDEPENDENT_AMBULATORY_CARE_PROVIDER_SITE_OTHER): Payer: Self-pay | Admitting: Specialist

## 2017-09-23 ENCOUNTER — Other Ambulatory Visit (INDEPENDENT_AMBULATORY_CARE_PROVIDER_SITE_OTHER): Payer: Self-pay | Admitting: Specialist

## 2017-09-23 NOTE — Telephone Encounter (Signed)
Baclofen refill request 

## 2017-09-24 MED ORDER — HYDROCODONE-ACETAMINOPHEN 7.5-325 MG PO TABS: 1.0000 | ORAL_TABLET | Freq: Four times a day (QID) | ORAL | 0 refills | Status: DC | PRN

## 2017-09-24 NOTE — Telephone Encounter (Signed)
Patient called this morning also requesting refill on hydrocodone patients # [phone]

## 2017-09-30 ENCOUNTER — Other Ambulatory Visit: Payer: Self-pay

## 2017-09-30 ENCOUNTER — Encounter (HOSPITAL_COMMUNITY): Payer: Self-pay | Admitting: *Deleted

## 2017-09-30 ENCOUNTER — Telehealth: Payer: Self-pay

## 2017-09-30 ENCOUNTER — Emergency Department (HOSPITAL_COMMUNITY): Payer: Medicare Other

## 2017-09-30 ENCOUNTER — Encounter: Payer: Self-pay | Admitting: Student in an Organized Health Care Education/Training Program

## 2017-09-30 ENCOUNTER — Inpatient Hospital Stay (HOSPITAL_COMMUNITY)
Admission: EM | Admit: 2017-09-30 | Discharge: 2017-10-04 | DRG: 291 | Disposition: A | Discharge status: Home / Self Care | Payer: Medicare Other | Attending: Family Medicine | Admitting: Family Medicine

## 2017-09-30 ENCOUNTER — Other Ambulatory Visit (INDEPENDENT_AMBULATORY_CARE_PROVIDER_SITE_OTHER): Payer: Self-pay | Admitting: Specialist

## 2017-09-30 ENCOUNTER — Ambulatory Visit (INDEPENDENT_AMBULATORY_CARE_PROVIDER_SITE_OTHER): Payer: Medicare Other | Admitting: Student in an Organized Health Care Education/Training Program

## 2017-09-30 VITALS — BP 128/64 | HR 93 | Temp 99.0°F | Ht 70.0 in | Wt >= 6400 oz

## 2017-09-30 DIAGNOSIS — B1910 Unspecified viral hepatitis B without hepatic coma: Secondary | ICD-10-CM | POA: Diagnosis present

## 2017-09-30 DIAGNOSIS — Z6841 Body Mass Index (BMI) 40.0 and over, adult: Secondary | ICD-10-CM | POA: Diagnosis not present

## 2017-09-30 DIAGNOSIS — I5033 Acute on chronic diastolic (congestive) heart failure: Secondary | ICD-10-CM | POA: Diagnosis not present

## 2017-09-30 DIAGNOSIS — F418 Other specified anxiety disorders: Secondary | ICD-10-CM | POA: Diagnosis present

## 2017-09-30 DIAGNOSIS — R0789 Other chest pain: Secondary | ICD-10-CM

## 2017-09-30 DIAGNOSIS — M160 Bilateral primary osteoarthritis of hip: Secondary | ICD-10-CM | POA: Diagnosis present

## 2017-09-30 DIAGNOSIS — I10 Essential (primary) hypertension: Secondary | ICD-10-CM | POA: Diagnosis not present

## 2017-09-30 DIAGNOSIS — D751 Secondary polycythemia: Secondary | ICD-10-CM | POA: Diagnosis not present

## 2017-09-30 DIAGNOSIS — J918 Pleural effusion in other conditions classified elsewhere: Secondary | ICD-10-CM | POA: Diagnosis not present

## 2017-09-30 DIAGNOSIS — R0902 Hypoxemia: Secondary | ICD-10-CM | POA: Diagnosis not present

## 2017-09-30 DIAGNOSIS — I130 Hypertensive heart and chronic kidney disease with heart failure and stage 1 through stage 4 chronic kidney disease, or unspecified chronic kidney disease: Principal | ICD-10-CM | POA: Diagnosis present

## 2017-09-30 DIAGNOSIS — K219 Gastro-esophageal reflux disease without esophagitis: Secondary | ICD-10-CM | POA: Diagnosis not present

## 2017-09-30 DIAGNOSIS — M479 Spondylosis, unspecified: Secondary | ICD-10-CM | POA: Diagnosis present

## 2017-09-30 DIAGNOSIS — Z9989 Dependence on other enabling machines and devices: Secondary | ICD-10-CM

## 2017-09-30 DIAGNOSIS — E669 Obesity, unspecified: Secondary | ICD-10-CM | POA: Diagnosis not present

## 2017-09-30 DIAGNOSIS — G8929 Other chronic pain: Secondary | ICD-10-CM | POA: Diagnosis present

## 2017-09-30 DIAGNOSIS — R682 Dry mouth, unspecified: Secondary | ICD-10-CM | POA: Diagnosis not present

## 2017-09-30 DIAGNOSIS — R0600 Dyspnea, unspecified: Secondary | ICD-10-CM | POA: Diagnosis present

## 2017-09-30 DIAGNOSIS — Z7951 Long term (current) use of inhaled steroids: Secondary | ICD-10-CM

## 2017-09-30 DIAGNOSIS — J9621 Acute and chronic respiratory failure with hypoxia: Secondary | ICD-10-CM

## 2017-09-30 DIAGNOSIS — M19011 Primary osteoarthritis, right shoulder: Secondary | ICD-10-CM | POA: Diagnosis present

## 2017-09-30 DIAGNOSIS — I50813 Acute on chronic right heart failure: Secondary | ICD-10-CM

## 2017-09-30 DIAGNOSIS — N6019 Diffuse cystic mastopathy of unspecified breast: Secondary | ICD-10-CM | POA: Diagnosis present

## 2017-09-30 DIAGNOSIS — G4733 Obstructive sleep apnea (adult) (pediatric): Secondary | ICD-10-CM | POA: Diagnosis present

## 2017-09-30 DIAGNOSIS — M1A9XX0 Chronic gout, unspecified, without tophus (tophi): Secondary | ICD-10-CM | POA: Diagnosis not present

## 2017-09-30 DIAGNOSIS — J984 Other disorders of lung: Secondary | ICD-10-CM | POA: Diagnosis present

## 2017-09-30 DIAGNOSIS — Z79899 Other long term (current) drug therapy: Secondary | ICD-10-CM

## 2017-09-30 DIAGNOSIS — I110 Hypertensive heart disease with heart failure: Secondary | ICD-10-CM | POA: Diagnosis not present

## 2017-09-30 DIAGNOSIS — G43909 Migraine, unspecified, not intractable, without status migrainosus: Secondary | ICD-10-CM | POA: Diagnosis present

## 2017-09-30 DIAGNOSIS — N3281 Overactive bladder: Secondary | ICD-10-CM | POA: Diagnosis not present

## 2017-09-30 DIAGNOSIS — J90 Pleural effusion, not elsewhere classified: Secondary | ICD-10-CM | POA: Diagnosis not present

## 2017-09-30 DIAGNOSIS — E1140 Type 2 diabetes mellitus with diabetic neuropathy, unspecified: Secondary | ICD-10-CM | POA: Diagnosis not present

## 2017-09-30 DIAGNOSIS — Z5320 Procedure and treatment not carried out because of patient's decision for unspecified reasons: Secondary | ICD-10-CM | POA: Diagnosis not present

## 2017-09-30 DIAGNOSIS — Z87891 Personal history of nicotine dependence: Secondary | ICD-10-CM

## 2017-09-30 DIAGNOSIS — N319 Neuromuscular dysfunction of bladder, unspecified: Secondary | ICD-10-CM | POA: Diagnosis present

## 2017-09-30 DIAGNOSIS — I4581 Long QT syndrome: Secondary | ICD-10-CM | POA: Diagnosis not present

## 2017-09-30 DIAGNOSIS — E1122 Type 2 diabetes mellitus with diabetic chronic kidney disease: Secondary | ICD-10-CM | POA: Diagnosis not present

## 2017-09-30 DIAGNOSIS — E8770 Fluid overload, unspecified: Secondary | ICD-10-CM

## 2017-09-30 DIAGNOSIS — R0602 Shortness of breath: Secondary | ICD-10-CM

## 2017-09-30 DIAGNOSIS — E662 Morbid (severe) obesity with alveolar hypoventilation: Secondary | ICD-10-CM

## 2017-09-30 DIAGNOSIS — R600 Localized edema: Secondary | ICD-10-CM | POA: Diagnosis present

## 2017-09-30 DIAGNOSIS — J449 Chronic obstructive pulmonary disease, unspecified: Secondary | ICD-10-CM

## 2017-09-30 DIAGNOSIS — M19012 Primary osteoarthritis, left shoulder: Secondary | ICD-10-CM | POA: Diagnosis present

## 2017-09-30 DIAGNOSIS — R918 Other nonspecific abnormal finding of lung field: Secondary | ICD-10-CM | POA: Diagnosis not present

## 2017-09-30 DIAGNOSIS — N184 Chronic kidney disease, stage 4 (severe): Secondary | ICD-10-CM | POA: Diagnosis not present

## 2017-09-30 DIAGNOSIS — N183 Chronic kidney disease, stage 3 unspecified: Secondary | ICD-10-CM

## 2017-09-30 DIAGNOSIS — Z794 Long term (current) use of insulin: Secondary | ICD-10-CM | POA: Diagnosis not present

## 2017-09-30 DIAGNOSIS — Z885 Allergy status to narcotic agent status: Secondary | ICD-10-CM

## 2017-09-30 DIAGNOSIS — R402143 Coma scale, eyes open, spontaneous, at hospital admission: Secondary | ICD-10-CM | POA: Diagnosis present

## 2017-09-30 DIAGNOSIS — I509 Heart failure, unspecified: Secondary | ICD-10-CM

## 2017-09-30 DIAGNOSIS — R0609 Other forms of dyspnea: Secondary | ICD-10-CM | POA: Diagnosis not present

## 2017-09-30 DIAGNOSIS — R402363 Coma scale, best motor response, obeys commands, at hospital admission: Secondary | ICD-10-CM | POA: Diagnosis present

## 2017-09-30 DIAGNOSIS — E785 Hyperlipidemia, unspecified: Secondary | ICD-10-CM | POA: Diagnosis not present

## 2017-09-30 DIAGNOSIS — M549 Dorsalgia, unspecified: Secondary | ICD-10-CM | POA: Diagnosis present

## 2017-09-30 DIAGNOSIS — R402253 Coma scale, best verbal response, oriented, at hospital admission: Secondary | ICD-10-CM | POA: Diagnosis present

## 2017-09-30 DIAGNOSIS — Z888 Allergy status to other drugs, medicaments and biological substances status: Secondary | ICD-10-CM

## 2017-09-30 DIAGNOSIS — M79606 Pain in leg, unspecified: Secondary | ICD-10-CM | POA: Diagnosis present

## 2017-09-30 DIAGNOSIS — Z881 Allergy status to other antibiotic agents status: Secondary | ICD-10-CM

## 2017-09-30 LAB — I-STAT TROPONIN, ED
Troponin i, poc: 0.01 ng/mL (ref 0.00–0.08)
Troponin i, poc: 0.02 ng/mL (ref 0.00–0.08)

## 2017-09-30 LAB — HEPATIC FUNCTION PANEL
ALBUMIN: 3.4 g/dL — AB (ref 3.5–5.0)
ALK PHOS: 80 U/L (ref 38–126)
ALT: 31 U/L (ref 0–44)
AST: 32 U/L (ref 15–41)
Bilirubin, Direct: 0.1 mg/dL (ref 0.0–0.2)
Indirect Bilirubin: 0.4 mg/dL (ref 0.3–0.9)
TOTAL PROTEIN: 6.8 g/dL (ref 6.5–8.1)
Total Bilirubin: 0.5 mg/dL (ref 0.3–1.2)

## 2017-09-30 LAB — GLUCOSE, CAPILLARY: GLUCOSE-CAPILLARY: 176 mg/dL — AB (ref 70–99)

## 2017-09-30 LAB — I-STAT BETA HCG BLOOD, ED (MC, WL, AP ONLY): I-stat hCG, quantitative: <5 m[IU]/mL (ref <5)

## 2017-09-30 LAB — BASIC METABOLIC PANEL
ANION GAP: 12 (ref 5–15)
BUN: 13 mg/dL (ref 6–20)
CHLORIDE: 104 mmol/L (ref 98–111)
CO2: 24 mmol/L (ref 22–32)
Calcium: 9.4 mg/dL (ref 8.9–10.3)
Creatinine, Ser: 1.28 mg/dL — ABNORMAL HIGH (ref 0.44–1.00)
GFR calc Af Amer: 54 mL/min — ABNORMAL LOW (ref >60)
GFR calc non Af Amer: 47 mL/min — ABNORMAL LOW (ref >60)
GLUCOSE: 109 mg/dL — AB (ref 70–99)
Potassium: 4.1 mmol/L (ref 3.5–5.1)
SODIUM: 140 mmol/L (ref 135–145)

## 2017-09-30 LAB — CBC
HEMATOCRIT: 46.1 % — AB (ref 36.0–46.0)
HEMOGLOBIN: 15.4 g/dL — AB (ref 12.0–15.0)
MCH: 26.4 pg (ref 26.0–34.0)
MCHC: 33.4 g/dL (ref 30.0–36.0)
MCV: 78.9 fL (ref 78.0–100.0)
Platelets: 307 10*3/uL (ref 150–400)
RBC: 5.84 MIL/uL — ABNORMAL HIGH (ref 3.87–5.11)
RDW: 15.9 % — ABNORMAL HIGH (ref 11.5–15.5)
WBC: 11.5 10*3/uL — AB (ref 4.0–10.5)

## 2017-09-30 LAB — BRAIN NATRIURETIC PEPTIDE: B Natriuretic Peptide: 45 pg/mL (ref 0.0–100.0)

## 2017-09-30 LAB — CBG MONITORING, ED
Glucose-Capillary: 157 mg/dL — ABNORMAL HIGH (ref 70–99)
Glucose-Capillary: 132 mg/dL — ABNORMAL HIGH (ref 70–99)

## 2017-09-30 MED ORDER — UMECLIDINIUM BROMIDE 62.5 MCG/INH IN AEPB
1.0000 | INHALATION_SPRAY | Freq: Every day | RESPIRATORY_TRACT | Status: DC
  Administered 2017-10-01 – 2017-10-04 (×4): 1 via RESPIRATORY_TRACT
  Filled 2017-09-30: qty 7

## 2017-09-30 MED ORDER — ACETAMINOPHEN 650 MG RE SUPP: 650.0000 mg | Freq: Four times a day (QID) | RECTAL | Status: DC | PRN

## 2017-09-30 MED ORDER — BACLOFEN 20 MG PO TABS
20.0000 mg | ORAL_TABLET | Freq: Three times a day (TID) | ORAL | Status: DC
  Administered 2017-09-30 – 2017-10-04 (×11): 20 mg via ORAL
  Filled 2017-09-30 (×11): qty 1

## 2017-09-30 MED ORDER — FLUTICASONE FUROATE-VILANTEROL 100-25 MCG/INH IN AEPB
1.0000 | INHALATION_SPRAY | Freq: Every day | RESPIRATORY_TRACT | Status: DC
  Administered 2017-10-01 – 2017-10-04 (×4): 1 via RESPIRATORY_TRACT
  Filled 2017-09-30: qty 28

## 2017-09-30 MED ORDER — HYDROCODONE-ACETAMINOPHEN 7.5-325 MG PO TABS
1.0000 | ORAL_TABLET | Freq: Three times a day (TID) | ORAL | Status: DC | PRN
  Administered 2017-09-30 – 2017-10-01 (×2): 1 via ORAL
  Filled 2017-09-30 (×2): qty 1

## 2017-09-30 MED ORDER — ATORVASTATIN CALCIUM 40 MG PO TABS
40.0000 mg | ORAL_TABLET | Freq: Every day | ORAL | Status: DC
  Administered 2017-10-01 – 2017-10-04 (×4): 40 mg via ORAL
  Filled 2017-09-30 (×4): qty 1

## 2017-09-30 MED ORDER — DOCUSATE SODIUM 100 MG PO CAPS: 200.0000 mg | ORAL_CAPSULE | Freq: Two times a day (BID) | ORAL | Status: DC | PRN

## 2017-09-30 MED ORDER — BUSPIRONE HCL 10 MG PO TABS
10.0000 mg | ORAL_TABLET | Freq: Three times a day (TID) | ORAL | Status: DC
  Administered 2017-09-30 – 2017-10-04 (×11): 10 mg via ORAL
  Filled 2017-09-30 (×11): qty 1

## 2017-09-30 MED ORDER — LORATADINE 10 MG PO TABS
10.0000 mg | ORAL_TABLET | Freq: Every day | ORAL | Status: DC
  Administered 2017-10-01 – 2017-10-04 (×4): 10 mg via ORAL
  Filled 2017-09-30 (×4): qty 1

## 2017-09-30 MED ORDER — ALBUTEROL SULFATE (2.5 MG/3ML) 0.083% IN NEBU: 3.0000 mL | INHALATION_SOLUTION | Freq: Four times a day (QID) | RESPIRATORY_TRACT | Status: DC | PRN

## 2017-09-30 MED ORDER — GABAPENTIN 300 MG PO CAPS
600.0000 mg | ORAL_CAPSULE | Freq: Two times a day (BID) | ORAL | Status: DC
  Administered 2017-09-30 – 2017-10-04 (×8): 600 mg via ORAL
  Filled 2017-09-30 (×8): qty 2

## 2017-09-30 MED ORDER — FUROSEMIDE 10 MG/ML IJ SOLN
40.0000 mg | Freq: Once | INTRAMUSCULAR | Status: AC
  Administered 2017-10-01: 40 mg via INTRAVENOUS
  Filled 2017-09-30: qty 4

## 2017-09-30 MED ORDER — INSULIN ASPART 100 UNIT/ML ~~LOC~~ SOLN
0.0000 [IU] | Freq: Three times a day (TID) | SUBCUTANEOUS | Status: DC
  Administered 2017-10-01: 3 [IU] via SUBCUTANEOUS
  Administered 2017-10-01: 2 [IU] via SUBCUTANEOUS
  Administered 2017-10-01: 3 [IU] via SUBCUTANEOUS
  Administered 2017-10-02: 5 [IU] via SUBCUTANEOUS
  Administered 2017-10-02: 3 [IU] via SUBCUTANEOUS
  Administered 2017-10-03: 2 [IU] via SUBCUTANEOUS
  Administered 2017-10-03: 3 [IU] via SUBCUTANEOUS
  Administered 2017-10-03: 5 [IU] via SUBCUTANEOUS
  Administered 2017-10-04: 2 [IU] via SUBCUTANEOUS
  Administered 2017-10-04: 3 [IU] via SUBCUTANEOUS

## 2017-09-30 MED ORDER — HYDROCHLOROTHIAZIDE 25 MG PO TABS
25.0000 mg | ORAL_TABLET | Freq: Every day | ORAL | Status: DC
  Administered 2017-09-30 – 2017-10-04 (×5): 25 mg via ORAL
  Filled 2017-09-30 (×5): qty 1

## 2017-09-30 MED ORDER — ACETAMINOPHEN 325 MG PO TABS: 650.0000 mg | ORAL_TABLET | Freq: Four times a day (QID) | ORAL | Status: DC | PRN

## 2017-09-30 MED ORDER — FLUTICASONE-UMECLIDIN-VILANT 100-62.5-25 MCG/INH IN AEPB: 1.0000 | INHALATION_SPRAY | Freq: Every day | RESPIRATORY_TRACT | Status: DC

## 2017-09-30 MED ORDER — ALLOPURINOL 100 MG PO TABS
100.0000 mg | ORAL_TABLET | Freq: Every day | ORAL | Status: DC
  Administered 2017-10-01 – 2017-10-04 (×4): 100 mg via ORAL
  Filled 2017-09-30 (×4): qty 1

## 2017-09-30 MED ORDER — ENOXAPARIN SODIUM 40 MG/0.4ML ~~LOC~~ SOLN
40.0000 mg | SUBCUTANEOUS | Status: DC
  Administered 2017-09-30: 40 mg via SUBCUTANEOUS
  Filled 2017-09-30: qty 0.4

## 2017-09-30 MED ORDER — CITALOPRAM HYDROBROMIDE 20 MG PO TABS
40.0000 mg | ORAL_TABLET | Freq: Every day | ORAL | Status: DC
  Administered 2017-10-01 – 2017-10-04 (×4): 40 mg via ORAL
  Filled 2017-09-30 (×4): qty 2

## 2017-09-30 MED ORDER — LOSARTAN POTASSIUM 50 MG PO TABS
100.0000 mg | ORAL_TABLET | Freq: Every day | ORAL | Status: DC
  Administered 2017-10-01 – 2017-10-04 (×4): 100 mg via ORAL
  Filled 2017-09-30 (×4): qty 2

## 2017-09-30 MED ORDER — MONTELUKAST SODIUM 10 MG PO TABS
10.0000 mg | ORAL_TABLET | Freq: Every day | ORAL | Status: DC
  Administered 2017-09-30 – 2017-10-03 (×4): 10 mg via ORAL
  Filled 2017-09-30 (×4): qty 1

## 2017-09-30 MED ORDER — AMLODIPINE BESYLATE 10 MG PO TABS
10.0000 mg | ORAL_TABLET | Freq: Every day | ORAL | Status: DC
  Administered 2017-10-01 – 2017-10-04 (×4): 10 mg via ORAL
  Filled 2017-09-30 (×4): qty 1

## 2017-09-30 MED ORDER — DICLOFENAC SODIUM 1 % TD GEL
2.0000 g | Freq: Four times a day (QID) | TRANSDERMAL | Status: DC | PRN
  Administered 2017-10-01: 2 g via TOPICAL
  Filled 2017-09-30: qty 100

## 2017-09-30 MED ORDER — FUROSEMIDE 10 MG/ML IJ SOLN: 40.0000 mg | Freq: Once | INTRAMUSCULAR | Status: DC

## 2017-09-30 MED ORDER — VARENICLINE TARTRATE 0.5 MG PO TABS
0.5000 mg | ORAL_TABLET | Freq: Two times a day (BID) | ORAL | Status: DC
  Administered 2017-09-30 – 2017-10-04 (×8): 0.5 mg via ORAL
  Filled 2017-09-30 (×10): qty 1

## 2017-09-30 MED ORDER — FUROSEMIDE 10 MG/ML IJ SOLN
60.0000 mg | Freq: Once | INTRAMUSCULAR | Status: AC
  Administered 2017-09-30: 60 mg via INTRAVENOUS
  Filled 2017-09-30: qty 6

## 2017-09-30 MED ORDER — INSULIN GLARGINE 100 UNIT/ML ~~LOC~~ SOLN
20.0000 [IU] | Freq: Every day | SUBCUTANEOUS | Status: DC
  Administered 2017-10-01 – 2017-10-04 (×4): 20 [IU] via SUBCUTANEOUS
  Filled 2017-09-30 (×4): qty 0.2

## 2017-09-30 NOTE — Progress Notes (Signed)
CC: Patient and she saturation  HPI: [name] is a 52 y.o. female with PMH significant for obesity, CHF, T2DM, depression, anxiety, restrictive lung disease who presents to [facility] today found to have oxygen desaturations.  Dyspnea/chest tightness/oxygen desaturation Patient reports she has had increased dyspnea and chest tightness over the last several months.  She reports lightheadedness when she gets up to walk from her bedroom to the bathroom.  She desatted to 29 when she first arrived in our office.  Then subsequently she desatted to 32 with ambulatory pulse ox in the office and the test had to be discontinued due to dyspnea.  She does not have any chest tightness at this moment. She does have chronic cough which she feels is not bothering her today. No fevers. She does have a history of restrictive lung disease and pulmonary nodules seen on an abdominal CT in the past.  She was previously a smoker and quit 1 month ago.  She has a history of right-sided heart failure.  Depression Right after starting the medicine had a breakdown, could not get out of bed for several days. Feels she is depressed because of her chronic illnesses and limitations no her lifestyle. Feels her anxiety is bad. Feels she feels safe in her house, but anxiety is bad out in public because of breathing and pain. Would like to speak with Dr. Gwenlyn Saran again.  Depression screen [facility] 2/9 09/30/2017 09/30/2017 09/09/2017  Decreased Interest 1 0 1  Down, Depressed, Hopeless 1 0 1  PHQ - 2 Score 2 0 2  Altered sleeping 1 - -  Tired, decreased energy 1 - -  Change in appetite 1 - -  Feeling bad or failure about yourself  0 - -  Trouble concentrating 1 - -  Moving slowly or fidgety/restless 0 - -  Suicidal thoughts 0 - -  PHQ-9 Score 6 - -  Difficult doing work/chores Somewhat difficult - -  Some recent data might be hidden     Review of Symptoms:  See HPI for ROS.   CC, SH/smoking status, and VS noted.  Objective: BP  128/64   Pulse 93   Temp 99 F (37.2 C) (Oral)   Ht 5\' 10"  (1.778 m)   Wt (!) 409 lb 9.6 oz (185.8 kg)   LMP 04/10/2006   SpO2 93%   BMI 58.77 kg/m  GEN: Not acutely toxic-appearing, no acute distress RESPIRATORY: clear to auscultation bilaterally with no wheezes, rhonchi or rales, good effort, breath sounds distant. Speaks in full sentences. Comfortable work of breathing. CV: RRR, no m/r/g, no LE edema GI: soft, non-tender, non-distended, obese SKIN: warm and dry, no rashes or lesions NEURO: II-XII grossly intact, normal gait, peripheral sensation intact PSYCH: AAOx3, appropriate affect  Assessment and plan:  Oxygen desaturation New O2 requirement. Uncertain etiology, consider worsened restrictive lung disease vs heart failure exacerbation vs PE. Other VSS.  Lungs clear however breath sounds distant due to body habitus. Patient desats to 36 when ambulated around the office, and the test has had to be discontinued due to dyspnea.  She endorses lightheadedness and chest tightness that has been worsening over the last months.  I do not think she is stable for home from our office.  She will be escorted to the emergency department for further work-up and management.  -Additionally patient reports that her CPAP machine has not been working for her current settings and she would like the settings adjusted.  It would not be appropriate  for these adjustments to be made while patient is not being monitored.  Will consider pulmonology consult pending further work-up of dyspnea and new oxygen requirement in the emergency department today.  Depression with anxiety PHQ 9 is 6 today.  No homicidal or suicidal ideation.  We will continue citalopram at current dose.  Gave Dr. Gwenlyn Saran card for behavioral health.  This is a less acute issue at today's visit given patient's new oxygen requirement.  Will need to continue to address depression and anxiety in the future.  [name], MD,MS,  PGY3 09/30/2017 12:03  PM

## 2017-09-30 NOTE — ED Notes (Signed)
Report attempted 

## 2017-09-30 NOTE — Assessment & Plan Note (Addendum)
New O2 requirement. Uncertain etiology, consider worsened restrictive lung disease vs heart failure exacerbation vs PE. Other VSS.  Lungs clear however breath sounds distant due to body habitus. Patient desats to 26 when ambulated around the office, and the test has had to be discontinued due to dyspnea.  She endorses lightheadedness and chest tightness that has been worsening over the last months.  I do not think she is stable for home from our office.  She will be escorted to the emergency department for further work-up and management.  -Additionally patient reports that her CPAP machine has not been working for her current settings and she would like the settings adjusted.  It would not be appropriate for these adjustments to be made while patient is not being monitored.  Will consider pulmonology consult pending further work-up of dyspnea and new oxygen requirement in the emergency department today.

## 2017-09-30 NOTE — Telephone Encounter (Signed)
diclofenac sodium refill request

## 2017-09-30 NOTE — ED Notes (Signed)
Admitting at bedside 

## 2017-09-30 NOTE — ED Notes (Signed)
Called back to give additional information on pt pain level to Sarah. Pt states she has tightness in chest from fluid build up and that she is having back and neck pain that is chronic. She states her over all pain at a 5. Judson Roch is having Myriam Jacobson come down to assess if pt is appropriate.

## 2017-09-30 NOTE — Assessment & Plan Note (Signed)
PHQ 9 is 6 today.  No homicidal or suicidal ideation.  We will continue citalopram at current dose.  Gave Dr. Gwenlyn Saran card for behavioral health.  This is a less acute issue at today's visit given patient's new oxygen requirement.  Will need to continue to address depression and anxiety in the future.

## 2017-09-30 NOTE — H&P (Addendum)
[facility] Admission History and Physical Service Pager: [phone]  Patient name: [name] Medical record number: [id number] Date of birth: Jun 12, 1965 Age: 52 y.o. Gender: female  Primary Care Provider: [name], MD Consultants: None  Code Status: Full   Chief Complaint: Dyspnea   Assessment and Plan: [name] is a 52 y.o. female presenting with dyspnea for the past several months and new oxygen requirement. PMH is significant for HTN, COPD, CKD IV, T2DM, depression/anxiety, and OSA.   Dyspnea with new oxygen requirement: Pt reports a few month history of progressive dyspnea at rest and with exertion, associated with chest tightness. No oxygen requirement at home, however had desaturations to the 80's while ambulating in her PCP clinic today. Saturating well on 1-2L at rest currently, VSS. Most consistent with hypervolemia given patient's history of HFpEF (Echo 05/2017 with EF 65-70% and G1DD), diffuse bilateral interstitial prominences with bilateral pleural effusions on CXR, and LE pitting edema noted on bilateral extremities. BNP 45, however patient is morbidly obese, BMI 58.7. Patient compliant with her home lasix therapy 1m BID with appropriate diuresis, but likely has further fluid overload with poor diet and salt consumption. OHS likely component for desaturations given her body habitus, already diagnosed OSA, and slightly polycythemia at 15.4, suggesting possible long term recurrent hypoventilation with hypoxemia. Unlikely COPD exacerbation given exam findings. PFT's in 2018 showed mild restrictive lung disease. Low concern for pneumonia as patient is afebrile, without recent productive cough and no consolidations on CXR, however solitary leukocytosis of 11.5 noted. ACS unlikely as EKG NSR/ troponin negative x2, and patient presentation is more subacute in nature, only associated after she is dyspneic. PE unlikely, wells score for PE 0, and had  LLE U/S in 05/2017 without signs of DVT. -Admit to telemetry; attending Dr. McDiarmid   -Monitor telemetry; continuous pulse ox -Trend troponin -EKG in the am -Obtain repeat Echo  -PT ambulate with pulse ox in the am -S/p IV lasix 661min the ED >> reassess fluid status in the am  -Strict I&O's, daily weights  -Monitor BMP with diuresis especially given CKD IV  -Cont management of lung disease and OSA as below  -Wean oxygen as tolerated   Hypertension: Chronic, stable.  Normotensive on admission. Takes losartan 10054mHCTZ 79m73mnd amlodopine 10mg69mhome with good control.  -Cont home medications as above   Restrictive lung disease, ?COPD: Previously diagnosed with COPD without spirometry, however PFT in 2009 without abnormality and most recent PFTs in 04/2016 showing mild restrictive pattern, likely extrinsic. Pt does have a 40 pack year history of smoking, recently quit 1 month ago. Lungs generally clear on exam, but diminished breath sounds due to body habitus. Takes Albuterol and trelegy for control, has been using albuterol more for SOB without improvement. Would consider further evaluation outpatient for utility of inhalers with mild restriction.  -Albuterol as needed q4   -Cont home trelegy    CKD IV: Stable.  Cr 1.28, variable baseline appears around 1.5-2. GFR 54. Has not seen a nephrologist.   -Monitor BMP  - Replace electrolytes as indicated - monitor with diuresis, ensure adequate renal perfusion  Depression w/ Anxiety: Takes citalopram 40mg 12mbuspirone 10mg T57mt home. Denies SI/HI. Discussed this with her PCP this morning, will be setting up future appointment to speak with behavioral health.  -Cont home medications as above   Type 2 Diabetes Mellitus: Stable. A1c 7.4 in 08/2017. Glucose 109 on admit. Takes 3U novolog before  each meal and 20 U lantus daily.  -Moderate SSI -10 U lantus in the am - CBG's AC/QHS   OSA: Wears CPAP at night, however feels her settings  need to be adjusted to 7 (at 4).  -Cont home CPAP   Hyperlipidemia: Stable.  Last lipid panel in 04/2016 with LDL 156. Takes Atorvastatin 57m at home.  -Cont home atorvastatin   Previous Tobacco use:  Quit 1 month ago, on chantix BID with compliance.  -Cont Chantix   GERD: Stable.  Not endorsing symptoms currently. Omeprazole as needed at home.  -Can provided omeprazole if needed   Chronic pain: Endorses chronic back and leg pain, takes Norco 7.5-325 1-2 tablets q 6 hours as needed.  - Norco 7.5-325 q 8 hours as needed  - Continue home gabapentin, baclofen, voltaren gel  Seasonal allergies: Stable.  Takes montelukast, loratadine, and flonase at home.  -Cont home medications as above   Overactive bladder: Stable -Cont home Oxybutynin   Hepatitis B: Per serology in 06/2017 with reactive HepBsAg, Hep B Core Ab total, and Hep B E antibody, appears to have Hep B E antigen negative chronic hepatitis B. HB DNA <10.  Liver function wnl. No liver cirrhosis seen on CT abdomen in 08/2016. Following with ID, has follow up appointment 11/2017.  -No intervention at this time, ensure f/u outpatient   Gout: Chronic, stable.  - Continue home allopurinol 1055mdaily  FEN/GI: Heart healthy/carb modified  Prophylaxis: Lovenox    Disposition: Admit to telemetry  History of Present Illness:  Mary WILHIDEs a [age].o. female, with a past history significant for COPD, T2DM, HFpEF, and hypertension, that presented from her primary care physician's office after having oxygen desaturations to low 80's with ambulation. No oxygen requirement at home. Patient reports gradual and progressive onset of dyspnea and chest tightness over the past few months. When she gets too short of breath, she'll start feeling lightheaded and feel like she may pass out. However has had no syncopal episodes. States she has SOB at rest and with walking, can only a few feet before feeling out of breath. However, is largely  immobile and unable to exercise at baseline due to her chronic pains. She notes the only time she doesn't feel SOB is when she uses her CPAP at night, but doesn't feel her new CPAP is titrated high enough. Patient sleeps with one pillow at night on her right side, unable to lay flat. She also reports her left leg is always swollen, but has noticed increased swelling bilaterally after eating some salted meat a few days ago. On a daily basis, generally eats a lot of pasta with sea salt and has chMongoliaood about once a week. She has tried using her albuterol more without improvement in her breathing, compliant with daily inhaler. Patient has appointment with cardiologist on 9/30 for first time to have stress test done, but does not have a pulmonologist.   She lives alone and has an aide to come help her at the home every day for a few hours. She has a friend who comes by to check on her occasionally as well.   On arrival to the ED, she was afebrile and hemodynamically stable. Desaturated to 89% while in the ED. CBC and CMP notable for Cr 1.28, WBC 11.5, Hgb 15.4, and plts 307. BNP 45. Troponin negative x1. CXR showing severe cardiomegaly with diffuse bilateral from interstitial prominence bilateral pleural effusions, consistent with CHF. EKG NSR without ischemic changes.  Received IV lasix 24m while in the ED, with appropriate diuresis. Admitted for further diuresis and evaluation of dyspnea with oxygen desaturations.   Review Of Systems: Per HPI with the following additions:   Review of Systems  Constitutional: Negative for chills and fever.  HENT: Negative for sore throat.   Respiratory: Positive for cough and shortness of breath. Negative for sputum production.   Cardiovascular: Positive for orthopnea and leg swelling. Negative for chest pain.       Chest tightness with shortness of breath  Gastrointestinal: Positive for diarrhea.  Neurological: Positive for dizziness.       Lightheaded     Patient Active Problem List   Diagnosis Date Noted  . Oxygen desaturation 09/30/2017  . Dyspnea 09/30/2017  . Hepatitis B surface antigen positive 07/08/2017  . COPD (chronic obstructive pulmonary disease) with chronic bronchitis ([facility] 06/27/2017  . Chronic kidney disease (CKD), stage IV (severe) ([place] 01/14/2017  . Type 2 diabetes mellitus with stage 3 chronic kidney disease, with long-term current use of insulin ([place] 12/12/2016  . Urge incontinence of urine 12/05/2016  . Chronic diastolic CHF (congestive heart failure) ([place] 04/07/2012  . GERD 01/26/2010  . Hypertension 08/04/2008  . OBSTRUCTIVE SLEEP APNEA 02/03/2008  . FIBROCYSTIC BREAST DISEASE 10/28/2006  . Hyperlipidemia 03/27/2006  . Morbid obesity ([place] 03/27/2006  . TOBACCO DEPENDENCE 03/27/2006  . Depression with anxiety 03/27/2006  . NEUROGENIC BLADDER 03/27/2006    Past Medical History: Past Medical History:  Diagnosis Date  . Arthritis 04-10-11   hips, shoulders, back  . Asthma   . Bipolar disorder ([place]   . Cancer ([place]   cervical, no treatment done, went away per pt  . Cervical dysplasia or atypia 04-10-11   '93- once dx.-got pregnant-no intervention, then postpartum, no dysplasia found  . CHF (congestive heart failure) ([facility]    no cardiologist 2014 dx, none now  . 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 Pathology showed Condyloma, gluteal CONDYLOMA ACUMINATUM.   .[name] KitchenDyspnea    with actity - better since taking Singular  . Fall 12/16/2016  . Fibrocystic breast disease   . Hypercalcemia   . Hyperlipidemia   . Hypertension   . Migraine headache    none recent  . Morbid obesity ([facility] 03/27/2006  . Neuropathy   . Skin lesion 03/15/2011   In gluteal crease now s/p removal by Dr. TGeorgette Doverof General Surgery on 3/12. Path shows condyloma.     . Sleep apnea 04-10-11   uses cpap, pt does not know settings  . Trigger finger    left third    Past Surgical History: Past Surgical  History:  Procedure Laterality Date  . ANAL FISTULECTOMY  04/17/2011   Procedure: FISTULECTOMY ANAL;  Surgeon: MImogene Burn TGeorgette Dover MD;  Location: [facility];  Service: General;  Laterality: N/A;  Excision of Condyloma Gluteal Cleft   . BACK SURGERY  04-10-11   x5-Lumbar fusion-retained hardware.(Dr. NLouanne Skye  . BREAST CYST EXCISION     bilateral breast, 3 cysts removed from each breast  . BREAST EXCISIONAL BIOPSY Right    x 3  . BREAST EXCISIONAL BIOPSY Left    x 3  . CARPAL TUNNEL RELEASE Bilateral   . Cervical biospy    . COLONOSCOPY WITH PROPOFOL N/A 07/06/2015   Procedure: COLONOSCOPY WITH PROPOFOL;  Surgeon: JTeena Irani MD;  Location: [facility];  Service: Endoscopy;  Laterality: N/A;  . DOPPLER E[place]OCARDIOGRAPHY  [place]06/2012   [place]  55[place]60[place]  . I&D EXTREMITY Left 12/18/2016   Procedure: IRRIGATION AND DEBRIDEMENT LEFT FOOT, CLOSURE;  Surgeon: [name], MD;  Location: [place];  Service: Orthopedics;  Laterality: Left;  [name] Kitchen MULTIPLE TOOTH EXTRACTIONS    . NECK SURGERY  04-10-11   x3- cervical fusion with plating and screws-Dr. [name]  . TEE WITHOUT CARDIOVERSION N/A 04/06/2012   Procedure: TRANSESOPHAGEAL ECHOCARDIOGRAM (TEE);  Surgeon: [name], MD;  [facility];  Service: Cardiovascular;  Laterality: N/A;  . TRIGGER FINGER RELEASE Right    middle finger  . TRIGGER FINGER RELEASE Left 06/28/2016   Procedure: RELEASE TRIGGER FINGER LEFT 3RD FINGER;  Surgeon: [name], MD;  Location: [place];  Service: Orthopedics;  Laterality: Left;  . TRIGGER FINGER RELEASE Right 06/12/2016   Procedure: RIGHT INDEX FINGER TRIGGER RELEASE;  Surgeon: [name], MD;  Location: [place];  Service: Orthopedics;  Laterality: Right;   Social History: Social History   Tobacco Use  . Smoking status: Former Smoker    Packs/day: 1.00    Years: 39.00    Pack years: 39.00    Types: Cigarettes    Last attempt to quit: 09/07/2017    Years since quitting: 0.0  . Smokeless tobacco:  Never Used  . Tobacco comment: Previous 1 ppd,  Quit during hospit. 12/16/2016  Substance Use Topics  . Alcohol use: No    Alcohol/week: 0.0 standard drinks  . Drug use: Not Currently    Types: Marijuana, "Crack" cocaine    Comment: hx marijuana usemany years ago, Crack -none since 11/2015   Additional social history: Stopped smoking 09/07/17, previously smoked >1ppd for 40 years; does not drink or do illicit drugs Please also refer to relevant sections of EMR.  Family History: Family History  Problem Relation Age of Onset  . Stroke Father   . Cancer Maternal Aunt        breast  . Asthma Other   . COPD Other   . Hypertension Other   . Diabetes Other     Allergies and Medications: Allergies  Allergen Reactions  . Chantix [Varenicline Tartrate] Other (See Comments)    On the 1 mg dose, patient experienced hallucintation  . 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 Levofloxacin Itching   No current facility-administered medications on file prior to encounter.    Current Outpatient Medications on File Prior to Encounter  Medication Sig Dispense Refill  . albuterol (PROAIR HFA) 108 (90 Base) MCG/ACT inhaler USE 1-2 PUFFS INTO LUNGS EVERY 4 HOURS AS NEEDED FOR WHEEZING OR SHORTNESS OF BREATH. 8.5 Inhaler 0  . allopurinol (ZYLOPRIM) 100 MG tablet Take 100 mg by mouth daily.    [name] Kitchen amLODipine (NORVASC) 10 MG tablet TAKE 1 TABLET (10 MG TOTAL) BY MOUTH DAILY. FOR BLOOD PRESSURE 90 tablet 0  . ammonium lactate (AMLACTIN) 12 % lotion Apply 1 application topically 2 (two) times daily as needed for dry skin. 400 g 0  . atorvastatin (LIPITOR) 40 MG tablet Take 1 tablet (40 mg total) daily by mouth. 90 tablet 3  . baclofen (LIORESAL) 10 MG tablet TAKE 2 TABLETS (20 MG TOTAL) BY MOUTH 3 (THREE) TIMES DAILY. 42 tablet 0  . busPIRone (BUSPAR) 10 MG tablet Take 1 tablet (10 mg total) by mouth 3 (three) times daily. 90 tablet 11  . Capsaicin-Menthol (PAIN RELIEF EX) Apply  1 application topically 3 (three) times daily as needed (shoulder pain).    [name] Kitchen  citalopram (CELEXA) 40 MG tablet Take 1 tablet (40 mg total) by mouth daily. 30 tablet 3  . docusate sodium (COLACE) 100 MG capsule Take 200 mg by mouth 2 (two) times daily.    . fluticasone (FLONASE) 50 MCG/ACT nasal spray USE 2 SPRAYS EACH NOSTRIL TWICE DAILY. 16 g 5  . Fluticasone-Umeclidin-Vilant (TRELEGY ELLIPTA) 100-62.5-25 MCG/INH AEPB Inhale 100 mcg into the lungs daily. (Patient taking differently: Inhale 1 puff into the lungs daily. ) 28 each 2  . furosemide (LASIX) 20 MG tablet Take 1 tablet (20 mg total) by mouth daily. 90 tablet 0  . gabapentin (NEURONTIN) 300 MG capsule TAKE 2 CAPSULES TWICE A DAY 120 capsule 3  . hydrochlorothiazide (HYDRODIURIL) 25 MG tablet TAKE 1 TABLET EACH DAY. (Patient taking differently: Take 25 mg by mouth daily. TAKE 1 TABLET EACH DAY.) 90 tablet 0  . HYDROcodone-acetaminophen (NORCO) 7.5-325 MG tablet Take 1-2 tablets by mouth every 6 (six) hours as needed for moderate pain or severe pain. 50 tablet 0  . hydrOXYzine (VISTARIL) 100 MG capsule Take 1 capsule (100 mg total) by mouth 3 (three) times daily as needed for itching. 30 capsule 0  . Insulin Glargine (LANTUS SOLOSTAR) 100 UNIT/ML Solostar Pen Inject 20 Units into the skin daily. (Patient taking differently: Inject 20 Units into the skin every morning. ) 15 mL 3  . insulin lispro (HUMALOG KWIKPEN) 100 UNIT/ML KiwkPen Inject 0.03 mLs (3 Units total) into the skin 3 (three) times daily before meals. 15 mL 2  . ketoconazole (NIZORAL) 2 % cream Apply 1 fingertip amount to each foot daily. (Patient taking differently: Apply 1 application topically 2 (two) times daily. Apply 1 fingertip amount to each foot TWICE DAILY) 30 g 0  . loratadine (CLARITIN) 10 MG tablet Take 1 tablet (10 mg total) by mouth daily. 90 tablet 0  . losartan (COZAAR) 100 MG tablet Take 1 tablet (100 mg total) by mouth daily. 90 tablet 3  . montelukast (SINGULAIR)  10 MG tablet Take 1 tablet (10 mg total) by mouth at bedtime. AS DIRECTED 90 tablet 3  . Multiple Vitamins-Minerals (MULTIVITAMIN WITH MINERALS) tablet Take 1 tablet by mouth daily.    . mupirocin ointment (BACTROBAN) 2 % Apply 1 application topically 2 (two) times daily. 22 g 0  . NON FORMULARY Uses a C-PAP at bedtime    . nystatin (MYCOSTATIN) 100000 UNIT/ML suspension Take 5 mLs (500,000 Units total) by mouth 4 (four) times daily. 473 mL 1  . omeprazole (PRILOSEC) 20 MG capsule Take 1 capsule (20 mg total) by mouth daily as needed. (Patient taking differently: Take 20 mg by mouth daily. ) 30 capsule 0  . oxybutynin (DITROPAN-XL) 10 MG 24 hr tablet Take 10 mg by mouth daily.  3  . varenicline (CHANTIX) 0.5 MG tablet Take 1 tablet (0.5 mg total) by mouth 2 (two) times daily. One daily x 3 days BID 57 tablet 3  . Vitamin D, Ergocalciferol, (DRISDOL) 50000 units CAPS capsule Take 50,000 Units by mouth every 7 (seven) days. MONDAYS    . ACCU-CHEK SOFTCLIX LANCETS lancets Use as instructed 100 each 12  . blood glucose meter kit and supplies KIT Dispense based on patient and insurance preference. Use up to four times daily as directed. (FOR ICD-9 250.00, 250.01). 1 each 0  . diazepam (VALIUM) 5 MG tablet Take one tablet at MRI and may repeat x 1  At MRI. (Patient not taking: Reported on 09/30/2017) 2 tablet 0  . diclofenac sodium (VOLTAREN)  1 % GEL APPLY 2 GRAMS TO AFFECTED AREA 4 TIMES A DAY 300 g 2  . glucose blood (ACCU-CHEK AVIVA) test strip Use as instructed 100 each 12  . Insulin Pen Needle (B-D UF III MINI PEN NEEDLES) 31G X 5 MM MISC CHECK SUGARS 4 TIMES A DAY BEFORE MEALS AND AT BEDTIME. 100 each 12  . [DISCONTINUED] sucralfate (CARAFATE) 1 G tablet Take 1 tablet (1 g total) by mouth daily. 30 tablet 6    Objective: BP 107/69   Pulse 77   Temp 99.8 F (37.7 C) (Oral)   Resp 16   LMP 04/10/2006   SpO2 93%  Exam: General: Alert, NAD, laying on her right side at 20% incline  HEENT: NCAT,  MMM   Cardiac: Distant heart sounds, RRR no m/g/r appreciated  Lungs: Faint breath sounds due to body habitus, however clear with no overt wheezing or crackles noted, no increased WOB, on 2L satting 96-97%, titrated down to 1L while in the room satting 94-95%.  Abdomen: soft large abdomen, non-tender, non-distended, normoactive BS Msk: Moves all extremities spontaneously  Ext: Warm, dry, 2+ distal pulses, 1+ pitting edema to BLE, up to knee on R and to mid-shin on the L. [place] non-tender.     Labs and Imaging: CBC BMET  Recent Labs  Lab 09/30/17 1331  WBC 11.5*  HGB 15.4*  HCT 46.1*  PLT 307   Recent Labs  Lab 09/30/17 1331  NA 140  K 4.1  CL 104  CO2 24  BUN 13  CREATININE 1.28*  GLUCOSE 109*  CALCIUM 9.4     Dg Chest 2 View  Result Date: 09/30/2017 CLINICAL DATA:  Shortness of breath.  Lightheadedness. EXAM: CHEST - 2 VIEW COMPARISON:  Chest x-ray 12/17/2016. FINDINGS: Cardiomegaly with pulmonary venous congestion bilateral interstitial prominence. Bilateral pleural effusions. Findings consistent congestive heart failure. Findings have worsened from prior exam. Prior cervicothoracic spine fusion. IMPRESSION: Severe cardiomegaly. Diffuse bilateral from interstitial prominence bilateral pleural effusions. Findings consistent CHF. Findings have worsened from prior exam. Electronically Signed   By: [place]   On: 09/30/2017 13:22    Patriciaann Clan, DO 09/30/2017, 5:38 PM PGY-1, [facility] pager: [phone], text pages welcome  FPTS Upper-Level Resident Addendum   I have independently interviewed and examined the patient. I have discussed the above with the original author and agree with their documentation. My edits for correction/addition/clarification are in purple. Please see also any attending notes.    [place] Verneta Hamidi, DO PGY-2, [facility] 09/30/2017 9:18 PM  FPTS Service pager: [phone] (text pages welcome through  [place])

## 2017-09-30 NOTE — Patient Instructions (Signed)
It was a pleasure seeing you today in our clinic.   Please go straight to the emergency department. It is not safe for you to go home with your oxygen levels so low.  Our clinic's number is [phone]. Please call with questions or concerns about what we discussed today.  Be well, Dr. Burr Medico

## 2017-09-30 NOTE — ED Provider Notes (Signed)
[facility] EMERGENCY DEPARTMENT Provider Note   CSN: [id number] Arrival date & time: 09/30/17  1228     History   Chief Complaint Chief Complaint  Patient presents with  . Shortness of Breath    HPI [name] is a 52 y.o. female.  HPI Patient was seen at her family practice provider today and referred to the emergency department.  She was having oxygen saturation down to low 80s with ambulation on pulse oximetry.  She reports that she has had months of increasing exertional shortness of breath and increased swelling of her legs and abdomen.  She reports things however gotten significantly worse in the past week.  She reports now even trying to lie flat to sleep makes her very short of breath and walking short distances in her home makes her short of breath.  Worse with exertion she will get kind of tight feeling in her chest.  No documented fever or productive cough.  For she has been taking her Lasix as prescribed 20 mg twice a day.  Patient reports she is been making urine normally without pain burning or decreased urine output.  Patient is an ex-smoker as of approximately 1 month. Past Medical History:  Diagnosis Date  . Arthritis 04-10-11   hips, shoulders, back  . Asthma   . Bipolar disorder ([place])   . Cancer ([place]) 1993   cervical, no treatment done, went away per pt  . Cervical dysplasia or atypia 04-10-11   '93- once dx.-got pregnant-no intervention, then postpartum, no dysplasia found  . CHF (congestive heart failure) ([place])    no cardiologist 2014 dx, none now  . 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 Pathology showed Condyloma, gluteal CONDYLOMA ACUMINATUM.   [name] Kitchen Dyspnea    with actity - better since taking Singular  . Fall 12/16/2016  . Fibrocystic breast disease   . Hypercalcemia   . Hyperlipidemia   . Hypertension   . Migraine headache    none recent  . Morbid obesity ([place]) 03/27/2006  . Neuropathy   . Skin lesion  03/15/2011   In gluteal crease now s/p removal by Dr. [name] of General Surgery on 3/12. Path shows condyloma.     . Sleep apnea 04-10-11   uses cpap, pt does not know settings  . Trigger finger    left third    Patient Active Problem List   Diagnosis Date Noted  . Oxygen desaturation 09/30/2017  . Hepatitis B surface antigen positive 07/08/2017  . COPD (chronic obstructive pulmonary disease) with chronic bronchitis ([place]) 06/27/2017  . Chronic kidney disease (CKD), stage IV (severe) ([place]) 01/14/2017  . Type 2 diabetes mellitus with stage 3 chronic kidney disease, with long-term current use of insulin ([place]) 12/12/2016  . Urge incontinence of urine 12/05/2016  . Chronic diastolic CHF (congestive heart failure) ([place]) 04/07/2012  . GERD 01/26/2010  . Hypertension 08/04/2008  . OBSTRUCTIVE SLEEP APNEA 02/03/2008  . FIBROCYSTIC BREAST DISEASE 10/28/2006  . Hyperlipidemia 03/27/2006  . Morbid obesity ([place]) 03/27/2006  . TOBACCO DEPENDENCE 03/27/2006  . Depression with anxiety 03/27/2006  . NEUROGENIC BLADDER 03/27/2006    Past Surgical History:  Procedure Laterality Date  . ANAL FISTULECTOMY  04/17/2011   Procedure: FISTULECTOMY ANAL;  Surgeon: Imogene Burn. [name], MD;  Location: [facility];  Service: General;  Laterality: N/A;  Excision of Condyloma Gluteal Cleft   . BACK SURGERY  04-10-11   x5-Lumbar fusion-retained hardware.(Dr. Louanne Skye)  . BREAST CYST EXCISION  bilateral breast, 3 cysts removed from each breast  . BREAST EXCISIONAL BIOPSY Right    x 3  . BREAST EXCISIONAL BIOPSY Left    x 3  . CARPAL TUNNEL RELEASE Bilateral   . Cervical biospy    . COLONOSCOPY WITH PROPOFOL N/A 07/06/2015   Procedure: COLONOSCOPY WITH PROPOFOL;  Surgeon: [name], MD;  Location: [facility];  Service: Endoscopy;  Laterality: N/A;  . DOPPLER ECHOCARDIOGRAPHY  04/06/2012   AT [place] 55-60%  . I&D EXTREMITY Left 12/18/2016   Procedure: IRRIGATION AND DEBRIDEMENT LEFT FOOT, CLOSURE;   Surgeon: [name], MD;  Location: [facility];  Service: Orthopedics;  Laterality: Left;  [name] Kitchen MULTIPLE TOOTH EXTRACTIONS    . NECK SURGERY  04-10-11   x3- cervical fusion with plating and screws-Dr. [name]  . TEE WITHOUT CARDIOVERSION N/A 04/06/2012   Procedure: TRANSESOPHAGEAL ECHOCARDIOGRAM (TEE);  Surgeon: [name], MD;  Location: [facility] ENDOSCOPY;  Service: Cardiovascular;  Laterality: N/A;  . TRIGGER FINGER RELEASE Right    middle finger  . TRIGGER FINGER RELEASE Left 06/28/2016   Procedure: RELEASE TRIGGER FINGER LEFT 3RD FINGER;  Surgeon: [name], MD;  Location: [facility];  Service: Orthopedics;  Laterality: Left;  . TRIGGER FINGER RELEASE Right 06/12/2016   Procedure: RIGHT INDEX FINGER TRIGGER RELEASE;  Surgeon: [name], MD;  Location: [facility];  Service: Orthopedics;  Laterality: Right;     OB History   None      Home Medications    Prior to Admission medications   Medication Sig Start Date End Date Taking? Authorizing Provider  ACCU-CHEK SOFTCLIX LANCETS lancets Use as instructed 01/17/17   [name], MD  albuterol [facility] HFA) 108 (90 Base) MCG/ACT inhaler USE 1-2 PUFFS INTO LUNGS EVERY 4 HOURS AS NEEDED FOR WHEEZING OR SHORTNESS OF BREATH. 06/10/17   [name], MD  allopurinol (ZYLOPRIM) 100 MG tablet Take 100 mg by mouth daily.    Rosita Fire, MD  amLODipine (NORVASC) 10 MG tablet TAKE 1 TABLET (10 MG TOTAL) BY MOUTH DAILY. FOR BLOOD PRESSURE 07/22/17   Lucila Maine C, DO  ammonium lactate (AMLACTIN) 12 % lotion Apply 1 application topically 2 (two) times daily as needed for dry skin. 06/25/17   Gardiner Barefoot, DPM  atorvastatin (LIPITOR) 40 MG tablet Take 1 tablet (40 mg total) daily by mouth. 12/11/16   [name], MD  baclofen (LIORESAL) 10 MG tablet TAKE 2 TABLETS (20 MG TOTAL) BY MOUTH 3 (THREE) TIMES DAILY. 09/24/17   Jessy Oto, MD  blood glucose meter kit and supplies KIT Dispense based on patient and insurance preference. Use up to four times  daily as directed. (FOR ICD-9 250.00, 250.01). 12/20/16   Bonnita Hollow, MD  busPIRone (BUSPAR) 10 MG tablet Take 1 tablet (10 mg total) by mouth 3 (three) times daily. 05/29/17   [name], MD  citalopram (CELEXA) 40 MG tablet Take 1 tablet (40 mg total) by mouth daily. 09/09/17   [name], MD  diazepam (VALIUM) 5 MG tablet Take one tablet at MRI and may repeat x 1  At MRI. 08/13/17   Jessy Oto, MD  diclofenac sodium (VOLTAREN) 1 % GEL Apply 2 g topically 4 (four) times daily. 05/23/17   Jessy Oto, MD  docusate sodium (COLACE) 100 MG capsule Take 200 mg by mouth 2 (two) times daily.    [provider]  fluticasone (FLONASE) 50 MCG/ACT nasal spray USE 2 SPRAYS EACH NOSTRIL TWICE  DAILY. 05/29/17   [name], MD  Fluticasone-Umeclidin-Vilant (TRELEGY ELLIPTA) 100-62.5-25 MCG/INH AEPB Inhale 100 mcg into the lungs daily. Patient taking differently: Inhale 1 puff into the lungs daily.  06/27/17   Kathrene Alu, MD  furosemide (LASIX) 20 MG tablet Take 1 tablet (20 mg total) by mouth daily. 08/15/17   [name], MD  gabapentin (NEURONTIN) 300 MG capsule TAKE 2 CAPSULES TWICE A DAY 08/20/17   [name], MD  glucose blood (ACCU-CHEK AVIVA) test strip Use as instructed 12/23/16   Zenia Resides, MD  hydrochlorothiazide (HYDRODIURIL) 25 MG tablet TAKE 1 TABLET EACH DAY. 07/29/17   [name], MD  HYDROcodone-acetaminophen (NORCO) 7.5-325 MG tablet Take 1-2 tablets by mouth every 6 (six) hours as needed for moderate pain or severe pain. 09/24/17   Jessy Oto, MD  hydrOXYzine (VISTARIL) 100 MG capsule Take 1 capsule (100 mg total) by mouth 3 (three) times daily as needed for itching. 05/29/17   [name], MD  Insulin Glargine (LANTUS SOLOSTAR) 100 UNIT/ML Solostar Pen Inject 20 Units into the skin daily. Patient taking differently: Inject 20 Units into the skin every morning.  05/05/17   Sherene Sires, DO  insulin lispro (HUMALOG KWIKPEN) 100 UNIT/ML KiwkPen Inject 0.03 mLs  (3 Units total) into the skin 3 (three) times daily before meals. 05/29/17   [name], MD  Insulin Pen Needle (B-D UF III MINI PEN NEEDLES) 31G X 5 MM MISC CHECK SUGARS 4 TIMES A DAY BEFORE MEALS AND AT BEDTIME. 08/25/17   [name], MD  ketoconazole (NIZORAL) 2 % cream Apply 1 fingertip amount to each foot daily. Patient taking differently: Apply 1 application topically 2 (two) times daily. Apply 1 fingertip amount to each foot TWICE DAILY 06/25/17   Gardiner Barefoot, DPM  loratadine (CLARITIN) 10 MG tablet Take 1 tablet (10 mg total) by mouth daily. 05/29/17   [name], MD  losartan (COZAAR) 100 MG tablet Take 1 tablet (100 mg total) by mouth daily. 05/29/17   [name], MD  montelukast (SINGULAIR) 10 MG tablet Take 1 tablet (10 mg total) by mouth at bedtime. AS DIRECTED 05/29/17   [name], MD  Multiple Vitamins-Minerals (MULTIVITAMIN WITH MINERALS) tablet Take 1 tablet by mouth daily.    [provider]  mupirocin ointment (BACTROBAN) 2 % Apply 1 application topically 2 (two) times daily. 05/29/17   [name], MD  NON FORMULARY Uses a C-PAP at bedtime    [provider]  nystatin (MYCOSTATIN) 100000 UNIT/ML suspension Take 5 mLs (500,000 Units total) by mouth 4 (four) times daily. 08/07/17   [name], MD  omeprazole (PRILOSEC) 20 MG capsule Take 1 capsule (20 mg total) by mouth daily as needed. Patient taking differently: Take 20 mg by mouth daily.  05/29/17   [name], MD  oxybutynin (DITROPAN-XL) 10 MG 24 hr tablet Take 10 mg by mouth daily. 08/06/17   [provider]  varenicline (CHANTIX) 0.5 MG tablet Take 1 tablet (0.5 mg total) by mouth 2 (two) times daily. One daily x 3 days BID 09/01/17   Zenia Resides, MD  Vitamin D, Ergocalciferol, (DRISDOL) 50000 units CAPS capsule Take 50,000 Units by mouth every 7 (seven) days. MONDAYS    [provider]  sucralfate (CARAFATE) 1 G tablet Take 1 tablet (1 g total) by mouth daily. 04/27/10 04/09/11   Arnetha Gula, MD    Family History Family History  Problem Relation Age of Onset  . Stroke Father   .  Cancer Maternal Aunt        breast  . Asthma Other   . COPD Other   . Hypertension Other   . Diabetes Other     Social History Social History   Tobacco Use  . Smoking status: Former Smoker    Packs/day: 1.00    Years: 39.00    Pack years: 39.00    Types: Cigarettes    Last attempt to quit: 09/07/2017    Years since quitting: 0.0  . Smokeless tobacco: Never Used  . Tobacco comment: Previous 1 ppd,  Quit during hospit. 12/16/2016  Substance Use Topics  . Alcohol use: No    Alcohol/week: 0.0 standard drinks  . Drug use: Not Currently    Types: Marijuana, "Crack" cocaine    Comment: hx marijuana usemany years ago, Crack -none since 11/2015     Allergies   Chantix [varenicline tartrate]; Methadone hcl; and Levofloxacin   Review of Systems Review of Systems 10 Systems reviewed and are negative for acute change except as noted in the HPI.   Physical Exam Updated Vital Signs BP 136/69 (BP Location: Right Arm)   Pulse 75   Temp 99.8 F (37.7 C) (Oral)   Resp 16   LMP 04/10/2006   SpO2 93%   Physical Exam  Constitutional: She is oriented to person, place, and time.  Patient is alert and nontoxic at rest.  Mental status is clear.  No respiratory distress at rest.  Morbid obesity.  HENT:  Head: Normocephalic and atraumatic.  Mouth/Throat: Oropharynx is clear and moist.  Eyes: EOM are normal.  Neck: Neck supple.  Cardiovascular: Normal rate, regular rhythm, normal heart sounds and intact distal pulses.  Pulmonary/Chest: Effort normal.  Breath sounds are soft and distant.  No gross rail or wheeze.  Abdominal: Soft. She exhibits no distension. There is no tenderness. There is no guarding.  Musculoskeletal:  1+ edema at the ankles and lower legs.  Feet and lower legs are in very good condition.  No wounds.  Calves are soft and nontender.  Neurological: She  is alert and oriented to person, place, and time. No cranial nerve deficit. She exhibits normal muscle tone. Coordination normal.  Skin: Skin is warm and dry.  Psychiatric: She has a normal mood and affect.     ED Treatments / Results  Labs (all labs ordered are listed, but only abnormal results are displayed) Labs Reviewed  BASIC METABOLIC PANEL - Abnormal; Notable for the following components:      Result Value   Glucose, Bld 109 (*)    Creatinine, Ser 1.28 (*)    GFR calc non Af Amer 47 (*)    GFR calc Af Amer 54 (*)    All other components within normal limits  CBC - Abnormal; Notable for the following components:   WBC 11.5 (*)    RBC 5.84 (*)    Hemoglobin 15.4 (*)    HCT 46.1 (*)    RDW 15.9 (*)    All other components within normal limits  HEPATIC FUNCTION PANEL - Abnormal; Notable for the following components:   Albumin 3.4 (*)    All other components within normal limits  BRAIN NATRIURETIC PEPTIDE  I-STAT TROPONIN, ED  I-STAT BETA HCG BLOOD, ED ([facility], [facility], [facility] ONLY)    EKG EKG Interpretation  Date/Time:  Tuesday September 30 2017 12:37:26 EDT Ventricular Rate:  76 PR Interval:  164 QRS Duration: 90 QT Interval:  420 QTC Calculation: 472 R Axis:  58 Text Interpretation:  Normal sinus rhythm Normal ECG agree. no change from previous Confirmed by Charlesetta Shanks [phone]) on 09/30/2017 1:49:11 PM   Radiology Dg Chest 2 View  Result Date: 09/30/2017 CLINICAL DATA:  Shortness of breath.  Lightheadedness. EXAM: CHEST - 2 VIEW COMPARISON:  Chest x-ray 12/17/2016. FINDINGS: Cardiomegaly with pulmonary venous congestion bilateral interstitial prominence. Bilateral pleural effusions. Findings consistent congestive heart failure. Findings have worsened from prior exam. Prior cervicothoracic spine fusion. IMPRESSION: Severe cardiomegaly. Diffuse bilateral from interstitial prominence bilateral pleural effusions. Findings consistent CHF. Findings have worsened from prior exam.  Electronically Signed   By: [place]   On: 09/30/2017 13:22    Procedures Procedures (including critical care time) CRITICAL CARE Performed by: Charlesetta Shanks   Total critical care time: 20 minutes  Critical care time was exclusive of separately billable procedures and treating other patients.  Critical care was necessary to treat or prevent imminent or life-threatening deterioration.  Critical care was time spent personally by me on the following activities: development of treatment plan with patient and/or surrogate as well as nursing, discussions with consultants, evaluation of patient's response to treatment, examination of patient, obtaining history from patient or surrogate, ordering and performing treatments and interventions, ordering and review of laboratory studies, ordering and review of radiographic studies, pulse oximetry and re-evaluation of patient's condition. Medications Ordered in ED Medications  furosemide (LASIX) injection 60 mg (has no administration in time range)     Initial Impression / Assessment and Plan / ED Course  I have reviewed the triage vital signs and the nursing notes.  Pertinent labs & imaging results that were available during my care of the patient were reviewed by me and considered in my medical decision making (see chart for details).  Clinical Course as of Oct 09 1648  Tue Sep 30, 2017  1454 Order placed for family practice teaching consultation.   [MP]  1520 Consult: Dr. Higinio Plan has returned to call for admission.    [MP]    Clinical Course User Index [MP] Charlesetta Shanks, MD   Patient presents with increasing dyspnea despite outpatient Lasix.  He is referred from her PCP office with identified hypoxia.  Symptoms appear secondary to acute on chronic CHF.  Treatment initiated in the emergency department with diuresis.  Patient admitted for further management.  Final Clinical Impressions(s) / ED Diagnoses   Final diagnoses:    Acute on chronic congestive heart failure, unspecified heart failure type [facility])    ED Discharge Orders    None       Charlesetta Shanks, MD 10/09/17 1651

## 2017-09-30 NOTE — Progress Notes (Signed)
Alert and oriented female admitted to unit room 5. Assisted to bed. Bed side commode provided for elimination. Call bell at side.  Weight obtained and documented.   Patient admiltted for chest tightness r/t CHF.  Tele applied and verified with box.

## 2017-09-30 NOTE — Telephone Encounter (Signed)
[name] with [employer] diabetes care called for patients most recent A1c. Information provided [name], RN

## 2017-09-30 NOTE — ED Triage Notes (Signed)
Pt in c/o worsening shortness of breath and cough for awhile, worse with exertion, went to PCP office and pulse ox was dropping into the 80s on RA, sent over for further evaluation, no distress noted

## 2017-10-01 ENCOUNTER — Inpatient Hospital Stay (HOSPITAL_COMMUNITY): Payer: Medicare Other

## 2017-10-01 ENCOUNTER — Telehealth: Payer: Self-pay | Admitting: Student in an Organized Health Care Education/Training Program

## 2017-10-01 ENCOUNTER — Other Ambulatory Visit (HOSPITAL_COMMUNITY): Payer: Medicare Other

## 2017-10-01 DIAGNOSIS — N183 Chronic kidney disease, stage 3 (moderate): Secondary | ICD-10-CM

## 2017-10-01 DIAGNOSIS — D751 Secondary polycythemia: Secondary | ICD-10-CM

## 2017-10-01 DIAGNOSIS — I5189 Other ill-defined heart diseases: Secondary | ICD-10-CM

## 2017-10-01 DIAGNOSIS — J984 Other disorders of lung: Secondary | ICD-10-CM

## 2017-10-01 DIAGNOSIS — Z6841 Body Mass Index (BMI) 40.0 and over, adult: Secondary | ICD-10-CM

## 2017-10-01 DIAGNOSIS — I519 Heart disease, unspecified: Secondary | ICD-10-CM

## 2017-10-01 DIAGNOSIS — I509 Heart failure, unspecified: Secondary | ICD-10-CM

## 2017-10-01 DIAGNOSIS — R600 Localized edema: Secondary | ICD-10-CM

## 2017-10-01 DIAGNOSIS — R0609 Other forms of dyspnea: Secondary | ICD-10-CM

## 2017-10-01 DIAGNOSIS — E669 Obesity, unspecified: Secondary | ICD-10-CM

## 2017-10-01 DIAGNOSIS — G4733 Obstructive sleep apnea (adult) (pediatric): Secondary | ICD-10-CM

## 2017-10-01 HISTORY — DX: Other ill-defined heart diseases: I51.89

## 2017-10-01 HISTORY — DX: Heart disease, unspecified: I51.9

## 2017-10-01 LAB — GLUCOSE, CAPILLARY
GLUCOSE-CAPILLARY: 108 mg/dL — AB (ref 70–99)
Glucose-Capillary: 153 mg/dL — ABNORMAL HIGH (ref 70–99)
Glucose-Capillary: 129 mg/dL — ABNORMAL HIGH (ref 70–99)
Glucose-Capillary: 153 mg/dL — ABNORMAL HIGH (ref 70–99)

## 2017-10-01 LAB — CBC WITH DIFFERENTIAL/PLATELET
ABS IMMATURE GRANULOCYTES: 0.1 10*3/uL (ref 0.0–0.1)
BASOS PCT: 0 %
Basophils Absolute: 0.1 10*3/uL (ref 0.0–0.1)
Eosinophils Absolute: 0.3 10*3/uL (ref 0.0–0.7)
Eosinophils Relative: 2 %
HCT: 48 % — ABNORMAL HIGH (ref 36.0–46.0)
HEMOGLOBIN: 16.2 g/dL — AB (ref 12.0–15.0)
IMMATURE GRANULOCYTES: 1 %
LYMPHS PCT: 21 %
Lymphs Abs: 2.5 10*3/uL (ref 0.7–4.0)
MCH: 26.7 pg (ref 26.0–34.0)
MCHC: 33.8 g/dL (ref 30.0–36.0)
MCV: 79.1 fL (ref 78.0–100.0)
MONOS PCT: 10 %
Monocytes Absolute: 1.2 10*3/uL — ABNORMAL HIGH (ref 0.1–1.0)
NEUTROS ABS: 7.9 10*3/uL — AB (ref 1.7–7.7)
NEUTROS PCT: 66 %
Platelets: 350 10*3/uL (ref 150–400)
RBC: 6.07 MIL/uL — ABNORMAL HIGH (ref 3.87–5.11)
RDW: 16.1 % — AB (ref 11.5–15.5)
WBC: 11.9 10*3/uL — ABNORMAL HIGH (ref 4.0–10.5)

## 2017-10-01 LAB — BASIC METABOLIC PANEL
ANION GAP: 10 (ref 5–15)
BUN: 15 mg/dL (ref 6–20)
CALCIUM: 9 mg/dL (ref 8.9–10.3)
CHLORIDE: 102 mmol/L (ref 98–111)
CO2: 27 mmol/L (ref 22–32)
CREATININE: 1.29 mg/dL — AB (ref 0.44–1.00)
GFR calc non Af Amer: 46 mL/min — ABNORMAL LOW (ref >60)
GFR, EST AFRICAN AMERICAN: 53 mL/min — AB (ref >60)
Glucose, Bld: 180 mg/dL — ABNORMAL HIGH (ref 70–99)
Potassium: 3.7 mmol/L (ref 3.5–5.1)
SODIUM: 139 mmol/L (ref 135–145)

## 2017-10-01 LAB — COMPREHENSIVE METABOLIC PANEL
ALT: 31 U/L (ref 0–44)
ANION GAP: 13 (ref 5–15)
AST: 31 U/L (ref 15–41)
Albumin: 3.7 g/dL (ref 3.5–5.0)
Alkaline Phosphatase: 83 U/L (ref 38–126)
BILIRUBIN TOTAL: 0.9 mg/dL (ref 0.3–1.2)
BUN: 14 mg/dL (ref 6–20)
CHLORIDE: 101 mmol/L (ref 98–111)
CO2: 26 mmol/L (ref 22–32)
Calcium: 9.5 mg/dL (ref 8.9–10.3)
Creatinine, Ser: 1.31 mg/dL — ABNORMAL HIGH (ref 0.44–1.00)
GFR calc non Af Amer: 45 mL/min — ABNORMAL LOW (ref >60)
GFR, EST AFRICAN AMERICAN: 52 mL/min — AB (ref >60)
Glucose, Bld: 127 mg/dL — ABNORMAL HIGH (ref 70–99)
POTASSIUM: 3.8 mmol/L (ref 3.5–5.1)
Sodium: 140 mmol/L (ref 135–145)
Total Protein: 7.2 g/dL (ref 6.5–8.1)

## 2017-10-01 LAB — ECHOCARDIOGRAM COMPLETE
Height: 70 in
WEIGHTICAEL: 6522.09 [oz_av]

## 2017-10-01 LAB — CBC
HCT: 44.8 % (ref 36.0–46.0)
HEMOGLOBIN: 15.1 g/dL — AB (ref 12.0–15.0)
MCH: 27 pg (ref 26.0–34.0)
MCHC: 33.7 g/dL (ref 30.0–36.0)
MCV: 80.1 fL (ref 78.0–100.0)
Platelets: 305 10*3/uL (ref 150–400)
RBC: 5.59 MIL/uL — AB (ref 3.87–5.11)
RDW: 15.9 % — ABNORMAL HIGH (ref 11.5–15.5)
WBC: 11.7 10*3/uL — AB (ref 4.0–10.5)

## 2017-10-01 LAB — TSH: TSH: 0.419 u[IU]/mL (ref 0.350–4.500)

## 2017-10-01 MED ORDER — ENOXAPARIN SODIUM 100 MG/ML ~~LOC~~ SOLN
90.0000 mg | SUBCUTANEOUS | Status: DC
  Administered 2017-10-01 – 2017-10-03 (×3): 90 mg via SUBCUTANEOUS
  Filled 2017-10-01 (×3): qty 1

## 2017-10-01 MED ORDER — DOCUSATE SODIUM 100 MG PO CAPS
200.0000 mg | ORAL_CAPSULE | Freq: Two times a day (BID) | ORAL | Status: DC
  Administered 2017-10-01 – 2017-10-04 (×7): 200 mg via ORAL
  Filled 2017-10-01 (×7): qty 2

## 2017-10-01 MED ORDER — HYDROCODONE-ACETAMINOPHEN 7.5-325 MG PO TABS
1.0000 | ORAL_TABLET | Freq: Four times a day (QID) | ORAL | Status: DC | PRN
  Administered 2017-10-01 – 2017-10-04 (×7): 2 via ORAL
  Filled 2017-10-01 (×3): qty 2
  Filled 2017-10-01: qty 1
  Filled 2017-10-01 (×2): qty 2
  Filled 2017-10-01: qty 1
  Filled 2017-10-01: qty 2

## 2017-10-01 MED ORDER — PERFLUTREN LIPID MICROSPHERE
1.0000 mL | INTRAVENOUS | Status: AC | PRN
  Administered 2017-10-01: 2 mL via INTRAVENOUS
  Filled 2017-10-01: qty 10

## 2017-10-01 MED ORDER — PERFLUTREN LIPID MICROSPHERE
INTRAVENOUS | Status: AC
  Administered 2017-10-01: 2 mL via INTRAVENOUS
  Filled 2017-10-01: qty 10

## 2017-10-01 NOTE — Progress Notes (Signed)
Patient refused CPAP at this time.  Patient states CPAP makes her mouth very dry and causes her tongue to hurt.  Sterile water had been added for humidity previous night.  RT will continue to monitor.

## 2017-10-01 NOTE — Telephone Encounter (Signed)
Opened in error

## 2017-10-01 NOTE — Progress Notes (Signed)
Pt set up on CPAP for the night- Pt placed on full face mask with pressure of 7 as per patient- Pt tolerating well.

## 2017-10-01 NOTE — Progress Notes (Signed)
SATURATION QUALIFICATIONS: (This note is used to comply with regulatory documentation for home oxygen)  Patient Saturations on Room Air at Rest = 93%  Patient Saturations on Room Air while Ambulating = 86%  Patient Saturations on 2 Liters of oxygen while Ambulating = 90%  Please briefly explain why patient needs home oxygen: Pt required supplemental oxygen to maintain SpO2 >90% with ambulation.  Mabeline Caras, PT, DPT Acute Rehabilitation Services  Pager [phone] Office [phone]

## 2017-10-01 NOTE — Progress Notes (Signed)
*  PRELIMINARY RESULTS* Echocardiogram 2D Echocardiogram with definity has been performed.  Leavy Cella 10/01/2017, 3:30 PM

## 2017-10-01 NOTE — Evaluation (Signed)
Physical Therapy Evaluation & Discharge Patient Details Name: [name] MRN: [id number] DOB: [birth year] Today's Date: 10/01/2017   History of Present Illness  Pt is a 51 y.o. female admitted 09/30/17 with c/o of a few month history of progressive dyspnea at rest and with exertion, associated with chest tightness. PMH includes DM, COPD, morbid obesity, HTN, CKD3, HF, back/neck sxs.    Clinical Impression  Patient evaluated by Physical Therapy with no further acute PT needs identified. PTA, pt indep with mobility; has PCA assist daily with household tasks. Today, pt mod indep ambulating with and without quad cane. Limited to short distances secondary to DOE; SpO2 down to 86% on RA with amb (see Saturation Qualifications note). All education has been completed and the patient has no further questions. Encouraged pt to continue ambulating with assist from mobility tech if needed. PT is signing off. Thank you for this referral.    Follow Up Recommendations No PT follow up;Supervision - Intermittent    Equipment Recommendations  None recommended by PT    Recommendations for Other Services       Precautions / Restrictions Precautions Precautions: Fall Restrictions Weight Bearing Restrictions: No      Mobility  Bed Mobility Overal bed mobility: Independent             General bed mobility comments: Received sitting EOB  Transfers Overall transfer level: Modified independent Equipment used: Quad cane Transfers: Sit to/from Stand Sit to Stand: Modified independent (Device/Increase time)            Ambulation/Gait Ambulation/Gait assistance: Modified independent (Device/Increase time) Gait Distance (Feet): 100 Feet Assistive device: None;Quad cane   Gait velocity: Decreased Gait velocity interpretation: 1.31 - 2.62 ft/sec, indicative of limited community ambulator General Gait Details: Amb 20' with no DME requiring seated rest rest, then an additional 57' mod indep  with quad cane. DOE 3/4; SpO2 down to 86% on RA  Stairs            Wheelchair Mobility    Modified Rankin (Stroke Patients Only)       Balance Overall balance assessment: Needs assistance   Sitting balance-Leahy Scale: Good       Standing balance-Leahy Scale: Fair                               Pertinent Vitals/Pain Pain Assessment: Faces Faces Pain Scale: Hurts even more Pain Location: Neck, back Pain Descriptors / Indicators: Constant Pain Intervention(s): Monitored during session;Limited activity within patient's tolerance    Home Living Family/patient expects to be discharged to:: Private residence Living Arrangements: Alone Available Help at Discharge: Family;Personal care attendant;Available PRN/intermittently Type of Home: House Home Access: Stairs to enter Entrance Stairs-Rails: Chemical engineer of Steps: 4 Home Layout: One level Home Equipment: Environmental consultant - 2 wheels;Tub bench;Cane - quad      Prior Function Level of Independence: Independent with assistive device(s)         Comments: Mod indep with quad cane. Drives. PCA comes ~1 hr/day to assist with household tasks     Hand Dominance        Extremity/Trunk Assessment   Upper Extremity Assessment Upper Extremity Assessment: Overall WFL for tasks assessed    Lower Extremity Assessment Lower Extremity Assessment: Generalized weakness(BLE edema (L>R))    Cervical / Trunk Assessment Cervical / Trunk Assessment: Other exceptions Cervical / Trunk Exceptions: Increased habitus  Communication   Communication: No difficulties  Cognition  Arousal/Alertness: Awake/alert Behavior During Therapy: WFL for tasks assessed/performed Overall Cognitive Status: Within Functional Limits for tasks assessed                                        General Comments      Exercises     Assessment/Plan    PT Assessment Patent does not need any further PT  services  PT Problem List         PT Treatment Interventions      PT Goals (Current goals can be found in the Care Plan section)  Acute Rehab PT Goals PT Goal Formulation: All assessment and education complete, DC therapy    Frequency     Barriers to discharge        Co-evaluation               AM-PAC PT "6 Clicks" Daily Activity  Outcome Measure Difficulty turning over in bed (including adjusting bedclothes, sheets and blankets)?: None Difficulty moving from lying on back to sitting on the side of the bed? : None Difficulty sitting down on and standing up from a chair with arms (e.g., wheelchair, bedside commode, etc,.)?: None Help needed moving to and from a bed to chair (including a wheelchair)?: None Help needed walking in hospital room?: None Help needed climbing 3-5 steps with a railing? : A Little 6 Click Score: 23    End of Session Equipment Utilized During Treatment: Gait belt;Oxygen Activity Tolerance: Patient tolerated treatment well Patient left: in bed;with call bell/phone within reach Nurse Communication: Mobility status PT Visit Diagnosis: Other abnormalities of gait and mobility (R26.89)    Time: 7517-0017 PT Time Calculation (min) (ACUTE ONLY): 30 min   Charges:   PT Evaluation $PT Eval Moderate Complexity: 1 Mod PT Treatments $Gait Training: 8-22 mins       Mabeline Caras, PT, DPT Acute Rehabilitation Services  Pager [phone] Office [place] 10/01/2017, 9:25 AM

## 2017-10-01 NOTE — Progress Notes (Signed)
To the best of my knowledge, documentation by Girtha Rm, nursing student at [employer] [facility] [employer] [facility] is correct.

## 2017-10-01 NOTE — Progress Notes (Signed)
Family Medicine Teaching Service Daily Progress Note Intern Pager: [phone]  Patient name: [name] Medical record number: [id number] Date of birth: 1965-10-07 Age: 52 y.o. Gender: female  Primary Care Provider: [name], MD Consultants: None  Code Status: Full   Assessment and Plan: [name] is a 52 y.o. female presenting with dyspnea for the past several months and new oxygen requirement. PMH is significant for HTN, COPD, CKD IV, T2DM, depression/anxiety, and OSA.   Dyspnea with new oxygen requirement: States her dyspnea is unchanged, but has noticed improvement in her LE edema back to her baseline. On RA, 93-96% spO2, VSS. Negative 2.3L since admission. Dry weight per pt around 388-390's, 407 lbs this am. EKG NSR with prolonged QTc 492. Walked with PT this am, had desaturations to 86% while on RA, maintains over 90% with 2L O2 with ambulation-qualifies for home O2. Question whether is a new oxygen requirement vs chronic and undetected.  -Obtain VBG  -Continuous pulse ox  -Echo pending -CT Chest pending  -IV lasix 40mg  BID  -Strict I&O's, daily weights  -Monitor BMP with diuresis  -Cont management of lung disease and OSA as below  Hypertension: Chronic, stable.  Normotensive overnight.  -Cont home medications Losartan, HCTZ, and amlodopine   Restrictive lung disease, ?COPD: PFTs with only restrictive pattern. Pt does have a 40 pack year history of smoking, recently quit 1 month ago. Would consider further evaluation outpatient for utility of inhalers with mild restriction.  -Albuterol as needed q4   -Cont home trelegy    CKD IV: Stable.  Cr 1.29, 1.28 on admit, variable baseline appears around 1.5-2.   -Monitor BMP  -Replete electrolytes as indicated  -Monitor closely with diuresis   Depression w/ Anxiety: Stable. Denies SI/HI.  -Cont home citalopram 40  and buspirone 10 TID    Type 2 Diabetes Mellitus: Stable. CBG 130-170s overnight. A1c 7.4 in 08/2017.  Glucose 109 on admit. Takes 3U novolog before each meal and 20 U lantus daily at home.  -Consider starting SGLT-2 inhibitor for added additional diuresis and weight loss at discharge  -Moderate SSI  -20 U lantus in the am -CBG AC/QHS  OSA: Adjusted setting to 7 per patient request last night.  -Cont home CPAP   Hyperlipidemia: Stable.  -Cont home atorvastatin   Previous Tobacco use:  Quit 1 month ago, on chantix BID with compliance.  -Cont Chantix   GERD: Stable.  Not endorsing symptoms currently.  -Can provided omeprazole if needed   Chronic pain: Complains of increased pain this am without her normal regimen at home. Takes Norco 7.5-325 1-2 tablets q 6 hours at home.  -Increase Norco 7.5-325 1-2 tablets q 6 PRN   -Cont home gabapentin, baclofen, voltaren gel   Seasonal allergies: Stable.  -Cont home montelukast, loratadine, and flonase   Overactive bladder: Stable -Cont home Oxybutynin   Hepatitis B: Per serology in 06/2017 with reactive HepBsAg, Hep B Core Ab total, and Hep B E antibody, appears to have Hep B E antigen negative chronic hepatitis B. HB DNA <10.  Liver function wnl. No liver cirrhosis seen on CT abdomen in 08/2016. Following with ID, has follow up appointment 11/2017.  -No intervention at this time, ensure f/u outpatient   Gout: Chronic, stable  -Cont home allopurinol 100mg  daily    FEN/GI: Heart healthy/carb modified, scheduled her colace  Prophylaxis: Lovenox    Disposition: Continued diuresis   Subjective:  Doing well this am, complains of back and leg pain without  her normal dose of pain medication. States her dyspnea and chest tightness are unchanged, but noticeable difference in her swelling. Feels her legs are back to baseline. Denies any lightheadedness, dizziness, or abdominal pain. Would like her BID laxative.   Objective: Temp:  [97.6 F (36.4 C)-99.8 F (37.7 C)] 98.4 F (36.9 C) (09/04 0355) Pulse Rate:  [71-93] 80 (09/04 0355) Resp:   [16-21] 18 (09/04 0355) BP: (107-155)/(47-95) 125/47 (09/04 0355) SpO2:  [89 %-96 %] 93 % (09/04 0355) Weight:  [184.9 kg-185.8 kg] 184.9 kg (09/04 0314) Physical Exam: General: Alert, NAD HEENT: NCAT, MMM Cardiac: Distant heart sounds, RRR no m/g/r Lungs: Clear bilaterally with diminished breath sounds, no increased WOB, sitting on RA satting 93-96%  Abdomen: soft, non-tender, non-distended, normoactive BS Msk: Moves all extremities spontaneously  Ext: Warm, dry, 2+ distal pulses, 1+ pitting edema in ankle of R, 1+ pitting edema to mid shin on the L.    Laboratory: Recent Labs  Lab 09/30/17 1331 10/01/17 0434  WBC 11.5* 11.7*  HGB 15.4* 15.1*  HCT 46.1* 44.8  PLT 307 305   Recent Labs  Lab 09/30/17 1331 10/01/17 0434  NA 140 139  K 4.1 3.7  CL 104 102  CO2 24 27  BUN 13 15  CREATININE 1.28* 1.29*  CALCIUM 9.4 9.0  PROT 6.8  --   BILITOT 0.5  --   ALKPHOS 80  --   ALT 31  --   AST 32  --   GLUCOSE 109* 180*      Imaging/Diagnostic Tests: Dg Chest 2 View  Result Date: 09/30/2017 CLINICAL DATA:  Shortness of breath.  Lightheadedness. EXAM: CHEST - 2 VIEW COMPARISON:  Chest x-ray 12/17/2016. FINDINGS: Cardiomegaly with pulmonary venous congestion bilateral interstitial prominence. Bilateral pleural effusions. Findings consistent congestive heart failure. Findings have worsened from prior exam. Prior cervicothoracic spine fusion. IMPRESSION: Severe cardiomegaly. Diffuse bilateral from interstitial prominence bilateral pleural effusions. Findings consistent CHF. Findings have worsened from prior exam. Electronically Signed   By: [name]   On: 09/30/2017 13:22    Mary Clan, DO 10/01/2017, 6:15 AM PGY-1, [place] Intern pager: [phone], text pages welcome

## 2017-10-02 DIAGNOSIS — I50813 Acute on chronic right heart failure: Secondary | ICD-10-CM

## 2017-10-02 DIAGNOSIS — J9621 Acute and chronic respiratory failure with hypoxia: Secondary | ICD-10-CM

## 2017-10-02 LAB — GLUCOSE, CAPILLARY
GLUCOSE-CAPILLARY: 162 mg/dL — AB (ref 70–99)
GLUCOSE-CAPILLARY: 162 mg/dL — AB (ref 70–99)
Glucose-Capillary: 228 mg/dL — ABNORMAL HIGH (ref 70–99)
Glucose-Capillary: 111 mg/dL — ABNORMAL HIGH (ref 70–99)

## 2017-10-02 LAB — BASIC METABOLIC PANEL
Anion gap: 13 (ref 5–15)
BUN: 15 mg/dL (ref 6–20)
CALCIUM: 9.5 mg/dL (ref 8.9–10.3)
CO2: 26 mmol/L (ref 22–32)
CREATININE: 1.38 mg/dL — AB (ref 0.44–1.00)
Chloride: 102 mmol/L (ref 98–111)
GFR calc Af Amer: 49 mL/min — ABNORMAL LOW (ref >60)
GFR, EST NON AFRICAN AMERICAN: 42 mL/min — AB (ref >60)
GLUCOSE: 145 mg/dL — AB (ref 70–99)
Potassium: 3.9 mmol/L (ref 3.5–5.1)
Sodium: 141 mmol/L (ref 135–145)

## 2017-10-02 LAB — CBC WITH DIFFERENTIAL/PLATELET
ABS IMMATURE GRANULOCYTES: 0.1 10*3/uL (ref 0.0–0.1)
BASOS PCT: 1 %
Basophils Absolute: 0.1 10*3/uL (ref 0.0–0.1)
EOS ABS: 0.3 10*3/uL (ref 0.0–0.7)
EOS PCT: 2 %
HCT: 47.3 % — ABNORMAL HIGH (ref 36.0–46.0)
Hemoglobin: 16.3 g/dL — ABNORMAL HIGH (ref 12.0–15.0)
Immature Granulocytes: 1 %
Lymphocytes Relative: 23 %
Lymphs Abs: 2.6 10*3/uL (ref 0.7–4.0)
MCH: 27.2 pg (ref 26.0–34.0)
MCHC: 34.5 g/dL (ref 30.0–36.0)
MCV: 79 fL (ref 78.0–100.0)
MONO ABS: 1.1 10*3/uL — AB (ref 0.1–1.0)
Monocytes Relative: 9 %
NEUTROS ABS: 7.2 10*3/uL (ref 1.7–7.7)
Neutrophils Relative %: 64 %
PLATELETS: 330 10*3/uL (ref 150–400)
RBC: 5.99 MIL/uL — ABNORMAL HIGH (ref 3.87–5.11)
RDW: 16 % — AB (ref 11.5–15.5)
WBC: 11.2 10*3/uL — ABNORMAL HIGH (ref 4.0–10.5)

## 2017-10-02 MED ORDER — FUROSEMIDE 10 MG/ML IJ SOLN: 40.0000 mg | Freq: Once | INTRAMUSCULAR | Status: DC

## 2017-10-02 MED ORDER — FUROSEMIDE 10 MG/ML IJ SOLN: 40.0000 mg | Freq: Two times a day (BID) | INTRAMUSCULAR | Status: DC

## 2017-10-02 MED ORDER — FLUTICASONE PROPIONATE 50 MCG/ACT NA SUSP
1.0000 | Freq: Every day | NASAL | Status: DC
  Administered 2017-10-02 – 2017-10-04 (×3): 1 via NASAL
  Filled 2017-10-02: qty 16

## 2017-10-02 MED ORDER — FAMOTIDINE 20 MG PO TABS
10.0000 mg | ORAL_TABLET | Freq: Every day | ORAL | Status: DC
  Administered 2017-10-02 – 2017-10-04 (×3): 10 mg via ORAL
  Filled 2017-10-02 (×3): qty 1

## 2017-10-02 NOTE — Progress Notes (Signed)
Patient refused CPAP.

## 2017-10-02 NOTE — Progress Notes (Addendum)
Family Medicine Teaching Service Daily Progress Note Intern Pager: [phone]  Patient name: [name] Medical record number: [id number] Date of birth: 1965-12-21 Age: 52 y.o. Gender: female  Primary Care Provider: [name], MD Consultants: None  Code Status: Full   Assessment and Plan: [name] is a 52 y.o. female presenting with dyspnea for the past several months and new oxygen requirement. PMH is significant for HTN, COPD, CKD IV, T2DM, depression/anxiety, and OSA.   Acute vs chronic hypoxic respiratory failure: Satting 84-93% on RA during sleep without CPAP, qualified for home O2 with PT yesterday during ambulation. Echo EF 55-60% with G1DD with elevated right atrium pressures. TSH wnl. Question whether this hypoxia is new or chronic, Likely multifactorial, consideration for obesity hypoventilation syndrome vs restrictive lung disease vs contributing R sided heart failure as below.  -Speak with HH for home O2  -Obtain VBG -Continuous pulse ox -HF and OSA treatment as below  -Monitor BMP  -Recommend repeat sleep study and pulmonary referral outpatient   Acute on chronic R sided HF: Intermittent dyspnea at rest and with exertion, improving. Lungs clear on exam, slight increase LE edema from yesterday. Negative 4.5L since admit, down 3.5kg, 400lbs (dry ?390). CT Chest 9/4 with no evidence of pulmonary edema, but small areas of opacity in lower lobes that may be atelectasis vs pneumonia. Echo EF 55-60% with G1DD with elevated right atrium pressures.  Discussed decreased salt intake with patient.  -IV lasix 40mg  BID  -Strict I&O's, daily weights -BMP esp diuresis with CKD  -Cont home atorvastatin, losartan   Hypertension: Chronic, stable.  Normotensive overnight.  -Cont home medications Losartan, HCTZ, and amlodopine   Restrictive lung disease, ?COPD: PFTs with only restrictive pattern. Pt does have a 40 pack year history of smoking, recently quit 1 month ago. Would  consider further evaluation outpatient for utility of inhalers with mild restriction.  -Albuterol as needed q4   -Cont home trelegy    Pulmonary nodules: 6 mm ill-defined nodule in the RUL. 61mm smaller irregular and denser nodule in the LULmeasuring most likely scarring.  -Non-contrast chest CT at 6-12 months is recommended.  -If stable at repeat, CT in 18-14 from CT 9/4 recommended if high risk   CKD: Stable.  Cr 1.38, 1.31 yesterday, variable baseline appears around 1.5-2.   -Monitor BMP  -Replete electrolytes as indicated  -Monitor closely with diuresis   Depression w/ Anxiety: Stable. Denies SI/HI.  -Cont home citalopram 40  and buspirone 10 TID    Type 2 Diabetes Mellitus: Stable. CBG 130-150s overnight. A1c 7.4 in 08/2017.  -Consider starting SGLT-2 inhibitor for added additional diuresis and weight loss outpatient  -Moderate SSI  -20 U lantus in the am -CBG AC/QHS  OSA: Refused last night bc this machine makes her mouth dry. Needs to her CPAP setting changed.  -Needs new sleep study o/p  -F/u CPAP settings  -Cont CPAP   Hyperlipidemia: Stable.  -Cont home atorvastatin   Previous Tobacco use:  Quit 1 month ago, on chantix BID with compliance.  -Cont Chantix   GERD: Stable.  Not endorsing symptoms currently.  -Can provided omeprazole if needed   Chronic pain: Complains of increased pain this am without her normal regimen at home.  - Norco 7.5-325 1-2 tablets q 6 PRN   -Cont home gabapentin, baclofen, voltaren gel   Seasonal allergies: Stable.  -Cont home montelukast, loratadine, and flonase   Overactive bladder: Stable -hold home Oxybutynin in setting of achieving diuresis  Hepatitis B: Following with ID, has follow up appointment 11/2017.  -No intervention at this time, ensure f/u outpatient   Gout: Chronic, stable  -Cont home allopurinol 100mg  daily   FEN/GI: Heart healthy/carb modified, scheduled colace  Prophylaxis: Lovenox    Disposition:  Continued diuresis, likely discharge tomorrow with home O2   Subjective:  Doing well this morning, refused CPAP overnight due to this machine making her mouth dry. Feels her legs are at baseline. Discussed decreased salt intake. Denies CP or SOB currently while sitting up.   Objective: Temp:  [98 F (36.7 C)-99.4 F (37.4 C)] 98.1 F (36.7 C) (09/05 0109) Pulse Rate:  [67-84] 70 (09/05 0109) Resp:  [16-24] 24 (09/05 0109) BP: (108-141)/(58-90) 139/90 (09/05 0109) SpO2:  [74 %-96 %] 84 % (09/05 0109) Physical Exam: General: Alert, NAD HEENT: NCAT, MMM Cardiac: RRR no m/g/r Lungs: Clear bilaterally with distant breath sounds, no increased WOB on RA, speaking in full sentences   Abdomen: soft large abdomen, non-tender, non-distended, normoactive BS Msk: Moves all extremities spontaneously  Ext: Warm, dry, 2+ distal pulses, 1+ bilateral LE pitting edema.   Laboratory: Recent Labs  Lab 09/30/17 1331 10/01/17 0434 10/01/17 1053  WBC 11.5* 11.7* 11.9*  HGB 15.4* 15.1* 16.2*  HCT 46.1* 44.8 48.0*  PLT 307 305 350   Recent Labs  Lab 09/30/17 1331 10/01/17 0434 10/01/17 1053  NA 140 139 140  K 4.1 3.7 3.8  CL 104 102 101  CO2 24 27 26   BUN 13 15 14   CREATININE 1.28* 1.29* 1.31*  CALCIUM 9.4 9.0 9.5  PROT 6.8  --  7.2  BILITOT 0.5  --  0.9  ALKPHOS 80  --  83  ALT 31  --  31  AST 32  --  31  GLUCOSE 109* 180* 127*      Imaging/Diagnostic Tests: Ct Chest Wo Contrast  Result Date: 10/01/2017 CLINICAL DATA:  Short of breath. Weakness. Evaluate for pulmonary edema. EXAM: CT CHEST WITHOUT CONTRAST TECHNIQUE: Multidetector CT imaging of the chest was performed following the standard protocol without IV contrast. COMPARISON:  Chest radiographs, 09/30/2017. FINDINGS: Cardiovascular: Heart normal in size. No pericardial effusion. Mild three-vessel coronary artery calcifications. Great vessels are normal in caliber. No aortic atherosclerotic calcifications. Mediastinum/Nodes: No  neck base, axillary, mediastinal or hilar masses or pathologically enlarged lymph nodes. Trachea is normal in caliber and widely patent. Esophagus is unremarkable. Lungs/Pleura: Small irregular and fairly dense nodule in the left upper lobe measuring 4 mm, image 27, series 8. Small nodule, partly ground-glass, with ill-defined margins in the right upper lobe measuring 6 mm, image 39, series 8. No other discrete nodules. Small areas of patchy peripheral opacity noted in the right lower lobe, most prominent posterior medially, a similar area noted in the left lower lobe. These may reflect infraction atelectasis or a combination. Minor scarring or atelectasis in the anteromedial right middle lobe. Lungs otherwise clear. No pleural effusion or pneumothorax. Upper Abdomen: No acute findings.  Hepatic steatosis. Musculoskeletal: No fracture or acute finding. No osteoblastic or osteolytic lesions. IMPRESSION: 1. No evidence of pulmonary edema. 2. Small areas of peripheral opacity in the lower lobes which may reflect atelectasis pneumonia or a combination. 3. 6 mm ill-defined nodule in the right upper lobe. Smaller irregular and denser nodule in the left upper lobe measuring 4 mm most likely scarring. Non-contrast chest CT at 6-12 months is recommended. If the nodule is stable at time of repeat CT, then future CT at 18-24 months (  from today's scan) is considered optional for low-risk patients, but is recommended for high-risk patients. This recommendation follows the consensus statement: Guidelines for Management of Incidental Pulmonary Nodules Detected on CT Images: From the Fleischner Society 2017; Radiology 2017; 284:228-243. Electronically Signed   By: Lajean Manes M.D.   On: 10/01/2017 13:56    Patriciaann Clan, DO 10/02/2017, 6:17 AM PGY-1, [place] Intern pager: [phone], text pages welcome

## 2017-10-03 ENCOUNTER — Other Ambulatory Visit: Payer: Self-pay

## 2017-10-03 LAB — CBC WITH DIFFERENTIAL/PLATELET
Abs Immature Granulocytes: 0.1 10*3/uL (ref 0.0–0.1)
BASOS PCT: 0 %
Basophils Absolute: 0 10*3/uL (ref 0.0–0.1)
EOS ABS: 0.3 10*3/uL (ref 0.0–0.7)
Eosinophils Relative: 3 %
HCT: 45 % (ref 36.0–46.0)
Hemoglobin: 15 g/dL (ref 12.0–15.0)
IMMATURE GRANULOCYTES: 1 %
Lymphocytes Relative: 29 %
Lymphs Abs: 2.9 10*3/uL (ref 0.7–4.0)
MCH: 26.5 pg (ref 26.0–34.0)
MCHC: 33.3 g/dL (ref 30.0–36.0)
MCV: 79.5 fL (ref 78.0–100.0)
MONOS PCT: 12 %
Monocytes Absolute: 1.2 10*3/uL — ABNORMAL HIGH (ref 0.1–1.0)
NEUTROS PCT: 55 %
Neutro Abs: 5.5 10*3/uL (ref 1.7–7.7)
PLATELETS: 301 10*3/uL (ref 150–400)
RBC: 5.66 MIL/uL — ABNORMAL HIGH (ref 3.87–5.11)
RDW: 15.8 % — AB (ref 11.5–15.5)
WBC: 9.9 10*3/uL (ref 4.0–10.5)

## 2017-10-03 LAB — BASIC METABOLIC PANEL
ANION GAP: 11 (ref 5–15)
BUN: 20 mg/dL (ref 6–20)
CHLORIDE: 100 mmol/L (ref 98–111)
CO2: 29 mmol/L (ref 22–32)
Calcium: 9.2 mg/dL (ref 8.9–10.3)
Creatinine, Ser: 1.42 mg/dL — ABNORMAL HIGH (ref 0.44–1.00)
GFR calc non Af Amer: 41 mL/min — ABNORMAL LOW (ref >60)
GFR, EST AFRICAN AMERICAN: 48 mL/min — AB (ref >60)
GLUCOSE: 140 mg/dL — AB (ref 70–99)
POTASSIUM: 3.9 mmol/L (ref 3.5–5.1)
Sodium: 140 mmol/L (ref 135–145)

## 2017-10-03 LAB — GLUCOSE, CAPILLARY
GLUCOSE-CAPILLARY: 126 mg/dL — AB (ref 70–99)
Glucose-Capillary: 209 mg/dL — ABNORMAL HIGH (ref 70–99)
Glucose-Capillary: 154 mg/dL — ABNORMAL HIGH (ref 70–99)
Glucose-Capillary: 143 mg/dL — ABNORMAL HIGH (ref 70–99)

## 2017-10-03 MED ORDER — FUROSEMIDE 10 MG/ML IJ SOLN
40.0000 mg | Freq: Once | INTRAMUSCULAR | Status: AC
  Administered 2017-10-03: 40 mg via INTRAVENOUS
  Filled 2017-10-03: qty 4

## 2017-10-03 NOTE — Discharge Instructions (Signed)
Please start taking Lasix 40mg  twice daily everyday in order to help with your fluid. A lower salt diet will also help to decrease the amount of salt in your diet, including sea salt and sodium that is in packaged foods.   Low-Sodium Eating Plan Sodium, which is an element that makes up salt, helps you maintain a healthy balance of fluids in your body. Too much sodium can increase your blood pressure and cause fluid and waste to be held in your body. Your health care provider or dietitian may recommend following this plan if you have high blood pressure (hypertension), kidney disease, liver disease, or heart failure. Eating less sodium can help lower your blood pressure, reduce swelling, and protect your heart, liver, and kidneys. What are tips for following this plan? General guidelines  Most people on this plan should limit their sodium intake to 1,500mg  (milligrams) of sodium each day. Reading food labels  The Nutrition Facts label lists the amount of sodium in one serving of the food. If you eat more than one serving, you must multiply the listed amount of sodium by the number of servings.  Choose foods with less than 140 mg of sodium per serving.  Avoid foods with 300 mg of sodium or more per serving. Shopping  Look for lower-sodium products, often labeled as "low-sodium" or "no salt added."  Always check the sodium content even if foods are labeled as "unsalted" or "no salt added".  Buy fresh foods. ? Avoid canned foods and premade or frozen meals. ? Avoid canned, cured, or processed meats  Buy breads that have less than 80 mg of sodium per slice. Cooking  Eat more home-cooked food and less restaurant, buffet, and fast food.  Avoid adding salt when cooking. Use salt-free seasonings or herbs instead of table salt or sea salt. Check with your health care provider or pharmacist before using salt substitutes.  Cook with plant-based oils, such as canola, sunflower, or olive  oil. Meal planning  When eating at a restaurant, ask that your food be prepared with less salt or no salt, if possible.  Avoid foods that contain MSG (monosodium glutamate). MSG is sometimes added to [place] food, bouillon, and some canned foods. What foods are recommended? The items listed may not be a complete list. Talk with your dietitian about what dietary choices are best for you. Grains Low-sodium cereals, including oats, puffed wheat and rice, and shredded wheat. Low-sodium crackers. Unsalted rice. Unsalted pasta. Low-sodium bread. Whole-grain breads and whole-grain pasta. Vegetables Fresh or frozen vegetables. "No salt added" canned vegetables. "No salt added" tomato sauce and paste. Low-sodium or reduced-sodium tomato and vegetable juice. Fruits Fresh, frozen, or canned fruit. Fruit juice. Meats and other protein foods Fresh or frozen (no salt added) meat, poultry, seafood, and fish. Low-sodium canned tuna and salmon. Unsalted nuts. Dried peas, beans, and lentils without added salt. Unsalted canned beans. Eggs. Unsalted nut butters. Dairy Milk. Soy milk. Cheese that is naturally low in sodium, such as ricotta cheese, fresh mozzarella, or Swiss cheese Low-sodium or reduced-sodium cheese. Cream cheese. Yogurt. Fats and oils Unsalted butter. Unsalted margarine with no trans fat. Vegetable oils such as canola or olive oils. Seasonings and other foods Fresh and dried herbs and spices. Salt-free seasonings. Low-sodium mustard and ketchup. Sodium-free salad dressing. Sodium-free light mayonnaise. Fresh or refrigerated horseradish. Lemon juice. Vinegar. Homemade, reduced-sodium, or low-sodium soups. Unsalted popcorn and pretzels. Low-salt or salt-free chips. What foods are not recommended? The items listed may not be a  complete list. Talk with your dietitian about what dietary choices are best for you. Grains Instant hot cereals. Bread stuffing, pancake, and biscuit mixes. Croutons.  Seasoned rice or pasta mixes. Noodle soup cups. Boxed or frozen macaroni and cheese. Regular salted crackers. Self-rising flour. Vegetables Sauerkraut, pickled vegetables, and relishes. Olives. [place] fries. Onion rings. Regular canned vegetables (not low-sodium or reduced-sodium). Regular canned tomato sauce and paste (not low-sodium or reduced-sodium). Regular tomato and vegetable juice (not low-sodium or reduced-sodium). Frozen vegetables in sauces. Meats and other protein foods Meat or fish that is salted, canned, smoked, spiced, or pickled. Bacon, ham, sausage, hotdogs, corned beef, chipped beef, packaged lunch meats, salt pork, jerky, pickled herring, anchovies, regular canned tuna, sardines, salted nuts. Dairy Processed cheese and cheese spreads. Cheese curds. Blue cheese. Feta cheese. String cheese. Regular cottage cheese. Buttermilk. Canned milk. Fats and oils Salted butter. Regular margarine. Ghee. Bacon fat. Seasonings and other foods Onion salt, garlic salt, seasoned salt, table salt, and sea salt. Canned and packaged gravies. Worcestershire sauce. Tartar sauce. Barbecue sauce. Teriyaki sauce. Soy sauce, including reduced-sodium. Steak sauce. Fish sauce. Oyster sauce. Cocktail sauce. Horseradish that you find on the shelf. Regular ketchup and mustard. Meat flavorings and tenderizers. Bouillon cubes. Hot sauce and Tabasco sauce. Premade or packaged marinades. Premade or packaged taco seasonings. Relishes. Regular salad dressings. Salsa. Potato and tortilla chips. Corn chips and puffs. Salted popcorn and pretzels. Canned or dried soups. Pizza. Frozen entrees and pot pies. Summary  Eating less sodium can help lower your blood pressure, reduce swelling, and protect your heart, liver, and kidneys.  Most people on this plan should limit their sodium intake to 1,500 mg (milligrams) of sodium each day.  Canned, boxed, and frozen foods are high in sodium. Restaurant foods, fast foods, and pizza  are also very high in sodium. You also get sodium by adding salt to food.  Try to cook at home, eat more fresh fruits and vegetables, and eat less fast food, canned, processed, or prepared foods. This information is not intended to replace advice given to you by your health care provider. Make sure you discuss any questions you have with your health care provider. Document Released: 07/06/2001 Document Revised: 01/08/2016 Document Reviewed: 01/08/2016 Elsevier Interactive Patient Education  [employer].

## 2017-10-03 NOTE — Progress Notes (Signed)
SATURATION QUALIFICATIONS: (This note is used to comply with regulatory documentation for home oxygen)  Patient Saturations on Room Air at Rest = 91%  Patient Saturations on Room Air while Ambulating = 93%  Patient Saturations on  Liters of oxygen while Ambulating =   Please briefly explain why patient needs home oxygen:  Pt will not qualify for home 02

## 2017-10-03 NOTE — Plan of Care (Signed)
  Problem: Safety: Goal: Ability to remain free from injury will improve Outcome: Progressing   

## 2017-10-03 NOTE — Progress Notes (Addendum)
Family Medicine Teaching Service Daily Progress Note Intern Pager: [phone]  Patient name: [name] Medical record number: [id number] Date of birth: Feb 24, 1965 Age: 52 y.o. Gender: female  Primary Care Provider: [name], MD Consultants: None  Code Status: Full   Assessment and Plan: [name] is a 52 y.o. female presenting with dyspnea for the past several months and new oxygen requirement. PMH is significant for HTN, COPD, CKD IV, T2DM, depression/anxiety, and OSA.   Acute vs chronic hypoxic respiratory failure: Improving. Feeling better this am. Satting 90-95% on 2L overnight bc refused CPAP, slept well. Qualifies for home O2, agreeable. Continue to question whether this hypoxia with ambulation is new vs chronic. Likely multifactorial, consideration for obesity hypoventilation syndrome vs restrictive lung disease vs contributing R sided heart failure as below. Discussed continued weight loss with patient this am.  -Obtain DME home oxygen -VBG  -Cont pulse ox -HF and OSA treatment as below -BMP -Recommend repeat sleep study and pulmonary referral outpatient   Acute on chronic R sided HF: Breathing improved today. Negative 5L since admit. Has 1+ pitting LE edema, however lungs are clear. CT Chest 9/4 with no evidence of pulmonary edema. Echo EF 55-60% with G1DD with elevated right atrium pressures.  Discussed decreased salt intake and appropriate diet with patient.  -IV lasix 40mg  BID -Strict I&O's, daily weights -BMP esp diuresis with CKD  -Cont home atorvastatin, losartan   Hypertension: Chronic, stable.  Normotensive overnight.  -Cont home medications Losartan, HCTZ, and amlodopine   Restrictive lung disease, ?COPD: PFTs with only restrictive pattern. Pt does have a 40 pack year history of smoking, recently quit 1 month ago. Would consider further evaluation outpatient for utility of inhalers with mild restriction.  -Albuterol as needed q4   -Cont home trelegy     Pulmonary nodules: 6 mm ill-defined nodule in the RUL. 23mm smaller irregular and denser nodule in the LULmeasuring most likely scarring.  -Non-contrast chest CT at 6-12 months is recommended.  -If stable at repeat, CT in 18-14 from CT 9/4 recommended if high risk   CKD: Stable.  Cr 1.42, 1.38 yesterday, variable baseline appears around 1.5-2.   -Monitor BMP  -Replete electrolytes as indicated  -Monitor closely with diuresis   Depression w/ Anxiety: Stable. Denies SI/HI.  -Cont home citalopram 40  and buspirone 10 TID    Type 2 Diabetes Mellitus: Stable. CBG 110-200s overnight. A1c 7.4 in 08/2017.  -Consider starting SGLT-2 inhibitor for added additional diuresis and weight loss outpatient  -Moderate SSI  -20 U lantus in the am -CBG AC/QHS  OSA: Refused CPAP last night due to dry mouth.  -Needs new sleep study o/p  -F/u CPAP settings  -Cont CPAP   Hyperlipidemia: Stable.  -Cont home atorvastatin   Previous Tobacco use:  Quit 1 month ago, on chantix BID with compliance.  -Cont Chantix   GERD: Stable.  Not endorsing symptoms currently.  -Can provided omeprazole if needed   Chronic pain: Stable.  - Norco 7.5-325 1-2 tablets q 6 PRN   -Cont home gabapentin, baclofen, voltaren gel   Seasonal allergies: Stable.  -Cont home montelukast, loratadine, and flonase   Overactive bladder: Stable -hold home Oxybutynin in setting of achieving diuresis    Hepatitis B: Following with ID, has follow up appointment 11/2017.  -No intervention at this time, ensure f/u outpatient   Gout: Chronic, stable  -Cont home allopurinol 100mg  daily   FEN/GI: Heart healthy/carb modified, scheduled colace  Prophylaxis: Lovenox  Disposition: Further diuresis, potential d/c tomorrow if good diuresis with increased home lasix dosing   Subjective:  Patient endorses some increased sob while laying down last night, put on 2L [place] and immediately resolved. Denies any CP or SOB this am, feels like  her breathing is good this am and is wanting to go home.   Objective: Temp:  [97.5 F (36.4 C)-98.3 F (36.8 C)] 97.5 F (36.4 C) (09/05 2009) Pulse Rate:  [70-84] 70 (09/05 2009) Resp:  [18-20] 18 (09/05 2009) BP: (110-130)/(59-85) 130/59 (09/05 2009) SpO2:  [86 %-95 %] 90 % (09/05 2009) Weight:  [181.4 kg] 181.4 kg (09/05 0701) Physical Exam: General: Alert, NAD  HEENT: NCAT, MMM Cardiac: RRR no m/g/r Lungs: Clear bilaterally with distant breath sounds, no increased WOB on 2L [place], speaking in full sentences  Abdomen: soft, non-tender, non-distended, normoactive BS Msk: Moves all extremities spontaneously  Ext: Warm, dry, 2+ distal pulses, 1+ pitting edema to bilateral LE    Laboratory: Recent Labs  Lab 10/01/17 0434 10/01/17 1053 10/02/17 0854  WBC 11.7* 11.9* 11.2*  HGB 15.1* 16.2* 16.3*  HCT 44.8 48.0* 47.3*  PLT 305 350 330   Recent Labs  Lab 09/30/17 1331 10/01/17 0434 10/01/17 1053 10/02/17 0854  NA 140 139 140 141  K 4.1 3.7 3.8 3.9  CL 104 102 101 102  CO2 24 27 26 26   BUN 13 15 14 15   CREATININE 1.28* 1.29* 1.31* 1.38*  CALCIUM 9.4 9.0 9.5 9.5  PROT 6.8  --  7.2  --   BILITOT 0.5  --  0.9  --   ALKPHOS 80  --  83  --   ALT 31  --  31  --   AST 32  --  31  --   GLUCOSE 109* 180* 127* 145*      Imaging/Diagnostic Tests: No results found.  Patriciaann Clan, DO 10/03/2017, 5:55 AM PGY-1, [place] Intern pager: [phone], text pages welcome

## 2017-10-03 NOTE — Progress Notes (Addendum)
Patient is for possible home oxygen; awaiting for qualifying oxygen saturations. Mindi Slicker [facility] [phone]  2:57 pm- Patient does not need any home oxygen at this time per [facility]. Mindi Slicker [facility] [phone]

## 2017-10-03 NOTE — Discharge Summary (Signed)
[facility] Discharge Summary  Patient name: [name] Medical record number: [id number] Date of birth: [birth year] Age: 52 y.o. Gender: female Date of Admission: 09/30/2017  Date of Discharge: 10/04/17 Admitting Physician: Blane Ohara McDiarmid, MD  Primary Care Provider: [name], MD Consultants: None  Indication for Hospitalization: Acute on chronic hypoxic respiratory failure. Acute on chronic right-sided heart failure.  Discharge Diagnoses/Problem List:  Acute on chronic respiratory failure  Disposition: Home  Discharge Condition: Good  Discharge Exam:  Temp:  [97.5 F (36.4 C)-97.8 F (36.6 C)] 97.8 F (36.6 C) (09/07 0615) Pulse Rate:  [63-65] 65 (09/07 0615) Resp:  [18] 18 (09/07 0615) BP: (117-159)/(63-83) 159/83 (09/07 0615) SpO2:  [92 %-95 %] 92 % (09/07 0615) Weight:  [181.2 kg] 181.2 kg (09/07 0615) Physical Exam: General: Well-developed, obese lady, in no acute distress. Cardiovascular: Regular rate and rhythm, no murmur/gallop. Respiratory: Mildly decreased breath sound at bases, most likely secondary to body habitus. Abdomen: Soft, obese, nontender, bowel sounds positive. Extremities: 1+ lower extremity edema bilaterally, pulses intact and symmetrical.  Brief Hospital Course: Patient was admitted due to worsening exertional dyspnea and lower extremity edema.  Desaturating with ambulation, morbidly obese.  Symptoms improved with diuresis.  Saturating well on room air while resting.  Mild saturation with ambulation, patient was discharged home with home oxygen with ambulation.  Her Lasix dose was increased to 40 mg twice daily. Will need a close follow-up.  Issues for Follow Up:  1. Consider starting SGLT2 given Heart and kidney disease with T2DM.  2. Patient will need f/u imaging for lung nodules on CT in 6-16mo 3. Patient sent home with supplemental O2, given hypoxia and R sided heart failure. Ensure patient is no longer smoking  and that she has received O2.  4. Please ensure patient has outpatient sleep study in order to help with proper settings on CPAP and possibly to diagnose OHS.    Significant Procedures: None  Significant Labs and Imaging:  Recent Labs  Lab 10/01/17 1053 10/02/17 0854 10/03/17 0610  WBC 11.9* 11.2* 9.9  HGB 16.2* 16.3* 15.0  HCT 48.0* 47.3* 45.0  PLT 350 330 301   Recent Labs  Lab 09/30/17 1331 10/01/17 0434 10/01/17 1053 10/02/17 0854 10/03/17 0610 10/04/17 0415  NA 140 139 140 141 140 140  K 4.1 3.7 3.8 3.9 3.9 3.5  CL 104 102 101 102 100 100  CO2 '24 27 26 26 29 28  ' GLUCOSE 109* 180* 127* 145* 140* 147*  BUN '13 15 14 15 20 ' 26*  CREATININE 1.28* 1.29* 1.31* 1.38* 1.42* 1.51*  CALCIUM 9.4 9.0 9.5 9.5 9.2 9.1  ALKPHOS 80  --  83  --   --   --   AST 32  --  31  --   --   --   ALT 31  --  31  --   --   --   ALBUMIN 3.4*  --  3.7  --   --   --       Results/Tests Pending at Time of Discharge: None  Discharge Medications:  Allergies as of 10/04/2017      Reactions   Chantix [varenicline Tartrate] Other (See Comments)   On the 1 mg dose, patient experienced hallucintation   Methadone Hcl Other (See Comments)    "blacked out" in 1990's   Levofloxacin Itching      Medication List    STOP taking these medications   diazepam 5 MG  tablet Commonly known as:  VALIUM   hydrochlorothiazide 25 MG tablet Commonly known as:  HYDRODIURIL   oxybutynin 10 MG 24 hr tablet Commonly known as:  DITROPAN-XL     TAKE these medications   ACCU-CHEK SOFTCLIX LANCETS lancets Use as instructed   albuterol 108 (90 Base) MCG/ACT inhaler Commonly known as:  PROVENTIL HFA;VENTOLIN HFA USE 1-2 PUFFS INTO LUNGS EVERY 4 HOURS AS NEEDED FOR WHEEZING OR SHORTNESS OF BREATH.   allopurinol 100 MG tablet Commonly known as:  ZYLOPRIM Take 100 mg by mouth daily.   amLODipine 10 MG tablet Commonly known as:  NORVASC TAKE 1 TABLET (10 MG TOTAL) BY MOUTH DAILY. FOR BLOOD PRESSURE    ammonium lactate 12 % lotion Commonly known as:  LAC-HYDRIN Apply 1 application topically 2 (two) times daily as needed for dry skin.   atorvastatin 40 MG tablet Commonly known as:  LIPITOR Take 1 tablet (40 mg total) daily by mouth.   baclofen 10 MG tablet Commonly known as:  LIORESAL TAKE 2 TABLETS (20 MG TOTAL) BY MOUTH 3 (THREE) TIMES DAILY.   blood glucose meter kit and supplies Kit Dispense based on patient and insurance preference. Use up to four times daily as directed. (FOR ICD-9 250.00, 250.01).   busPIRone 10 MG tablet Commonly known as:  BUSPAR Take 1 tablet (10 mg total) by mouth 3 (three) times daily.   citalopram 40 MG tablet Commonly known as:  CELEXA Take 1 tablet (40 mg total) by mouth daily.   diclofenac sodium 1 % Gel Commonly known as:  VOLTAREN APPLY 2 GRAMS TO AFFECTED AREA 4 TIMES A DAY What changed:  See the new instructions.   docusate sodium 100 MG capsule Commonly known as:  COLACE Take 200 mg by mouth 2 (two) times daily.   famotidine 10 MG tablet Commonly known as:  PEPCID Take 1 tablet (10 mg total) by mouth daily. Start taking on:  10/05/2017   fluticasone 50 MCG/ACT nasal spray Commonly known as:  FLONASE USE 2 SPRAYS EACH NOSTRIL TWICE DAILY.   Fluticasone-Umeclidin-Vilant 100-62.5-25 MCG/INH Aepb Inhale 100 mcg into the lungs daily. What changed:  how much to take   furosemide 40 MG tablet Commonly known as:  LASIX Take 1 tablet (40 mg total) by mouth 2 (two) times daily. What changed:    medication strength  how much to take  when to take this   gabapentin 300 MG capsule Commonly known as:  NEURONTIN TAKE 2 CAPSULES TWICE A DAY   glucose blood test strip Use as instructed   HYDROcodone-acetaminophen 7.5-325 MG tablet Commonly known as:  NORCO Take 1-2 tablets by mouth every 6 (six) hours as needed for moderate pain or severe pain.   hydrOXYzine 100 MG capsule Commonly known as:  VISTARIL Take 1 capsule (100 mg  total) by mouth 3 (three) times daily as needed for itching.   Insulin Glargine 100 UNIT/ML Solostar Pen Commonly known as:  LANTUS Inject 20 Units into the skin daily. What changed:  when to take this   insulin lispro 100 UNIT/ML KiwkPen Commonly known as:  HUMALOG Inject 0.03 mLs (3 Units total) into the skin 3 (three) times daily before meals.   Insulin Pen Needle 31G X 5 MM Misc CHECK SUGARS 4 TIMES A DAY BEFORE MEALS AND AT BEDTIME.   ketoconazole 2 % cream Commonly known as:  NIZORAL Apply 1 fingertip amount to each foot daily. What changed:    how much to take  how to take  this  when to take this  additional instructions   loratadine 10 MG tablet Commonly known as:  CLARITIN Take 1 tablet (10 mg total) by mouth daily.   losartan 100 MG tablet Commonly known as:  COZAAR Take 1 tablet (100 mg total) by mouth daily.   montelukast 10 MG tablet Commonly known as:  SINGULAIR Take 1 tablet (10 mg total) by mouth at bedtime. AS DIRECTED   multivitamin with minerals tablet Take 1 tablet by mouth daily.   mupirocin ointment 2 % Commonly known as:  BACTROBAN Apply 1 application topically 2 (two) times daily.   NON FORMULARY Uses a C-PAP at bedtime   nystatin 100000 UNIT/ML suspension Commonly known as:  MYCOSTATIN Take 5 mLs (500,000 Units total) by mouth 4 (four) times daily.   omeprazole 20 MG capsule Commonly known as:  PRILOSEC Take 1 capsule (20 mg total) by mouth daily as needed. What changed:  when to take this   PAIN RELIEF EX Apply 1 application topically 3 (three) times daily as needed (shoulder pain).   varenicline 0.5 MG tablet Commonly known as:  CHANTIX Take 1 tablet (0.5 mg total) by mouth 2 (two) times daily. One daily x 3 days BID   Vitamin D (Ergocalciferol) 50000 units Caps capsule Commonly known as:  DRISDOL Take 50,000 Units by mouth every 7 (seven) days. [place]  (From admission, onward)          Start     Ordered   10/04/17 1453  DME Oxygen  Once    Comments:  Only with ambulation.  Question Answer Comment  Mode or (Route) Nasal cannula   Liters per Minute 2   Oxygen delivery system Gas      10/04/17 1455   10/03/17 1349  For home use only DME oxygen  Once    Question Answer Comment  Mode or (Route) Nasal cannula   Liters per Minute 2   Frequency Continuous (stationary and portable oxygen unit needed)   Oxygen conserving device No   Oxygen delivery system Gas      10/03/17 1349          Discharge Instructions: Please refer to Patient Instructions section of EMR for full details.  Patient was counseled important signs and symptoms that should prompt return to medical care, changes in medications, dietary instructions, activity restrictions, and follow up appointments.   Follow-Up Appointments:   Lorella Nimrod, MD 10/04/2017, 2:56 PM PGY-3, [place]

## 2017-10-03 NOTE — Care Management Important Message (Signed)
Important Message  Patient Details  Name: [name] MRN: [id number] Date of Birth: 1965-08-15   Medicare Important Message Given:  Yes    Rukiya Hodgkins P [place] 10/03/2017, 4:16 PM

## 2017-10-04 DIAGNOSIS — I509 Heart failure, unspecified: Secondary | ICD-10-CM

## 2017-10-04 LAB — BASIC METABOLIC PANEL
ANION GAP: 12 (ref 5–15)
BUN: 26 mg/dL — ABNORMAL HIGH (ref 6–20)
CO2: 28 mmol/L (ref 22–32)
Calcium: 9.1 mg/dL (ref 8.9–10.3)
Chloride: 100 mmol/L (ref 98–111)
Creatinine, Ser: 1.51 mg/dL — ABNORMAL HIGH (ref 0.44–1.00)
GFR calc non Af Amer: 38 mL/min — ABNORMAL LOW (ref >60)
GFR, EST AFRICAN AMERICAN: 44 mL/min — AB (ref >60)
Glucose, Bld: 147 mg/dL — ABNORMAL HIGH (ref 70–99)
POTASSIUM: 3.5 mmol/L (ref 3.5–5.1)
SODIUM: 140 mmol/L (ref 135–145)

## 2017-10-04 LAB — GLUCOSE, CAPILLARY
GLUCOSE-CAPILLARY: 177 mg/dL — AB (ref 70–99)
Glucose-Capillary: 147 mg/dL — ABNORMAL HIGH (ref 70–99)

## 2017-10-04 MED ORDER — FLUTICASONE PROPIONATE 50 MCG/ACT NA SUSP
1.0000 | Freq: Every day | NASAL | Status: DC
  Filled 2017-10-04: qty 16

## 2017-10-04 MED ORDER — FUROSEMIDE 40 MG PO TABS: 40.0000 mg | ORAL_TABLET | Freq: Two times a day (BID) | ORAL | 0 refills | Status: DC

## 2017-10-04 MED ORDER — FAMOTIDINE 10 MG PO TABS: 10.0000 mg | ORAL_TABLET | Freq: Every day | ORAL | 0 refills | Status: DC

## 2017-10-04 NOTE — Progress Notes (Addendum)
Pt reports current onset of another "episode". She states that she has these daily, but they sometimes are longer. Pt expresses concern about going home without O2, prior to further discussion of current complications.   Upon checking patient's vitals, all are WNL and pt reports resolution of symptoms.   Upon MD entering room, pt ambulated to chair with no O2, pt proceeded to speak very continuously to MD without breaks; with no O2. Pt did not struggle to speak continuously without O2.  Given patient's complaint of "episode" we will postpone plan to ambulate pt for 'walking o2' since she is adamant about it not happening yesterday.

## 2017-10-04 NOTE — Progress Notes (Signed)
Patient has been comfortable on 1 liter of oxygen via nasal canula for more than an hour; after her longwinded conversation with MD without difficulty.   Pt has now been changed to ZERO liters/min. She has nasal canula in place so that her anxiety does not take over. Pt appears comfortable on the room air; though she is not aware she is on room air and still believes she is on 2 liters.  Spot check of O2 at this time, is 94% confirming adequate oxygenation at rest. Will ambulate pt per MD order, as she reports being done with her meal and is agreeable to walk.

## 2017-10-04 NOTE — Progress Notes (Signed)
Patient aware of scheduled chantix, waited to take meds for RN to go retrieve chanitx. Conflicting with documented allergy.

## 2017-10-04 NOTE — Progress Notes (Signed)
Placed on CPAP at this time with 2L O2 bled in, no distress noted RCP will continue to follow.

## 2017-10-04 NOTE — Care Management (Addendum)
Documentation and order reviewed.  Referral to [facility] with [place].  Portable O2 tank to be delivered to patient prior to dc.

## 2017-10-04 NOTE — Progress Notes (Signed)
Page to MD to notify of ambulating O2 note.

## 2017-10-04 NOTE — Progress Notes (Addendum)
SATURATION QUALIFICATIONS: (This note is used to comply with regulatory documentation for home oxygen)  Patient Saturations on Room Air at Rest = 98%  Patient Saturations on Room Air while Ambulating = 87%  Patient Saturations on 2 Liters of oxygen while Ambulating = 90%  Please briefly explain why patient needs home oxygen:  Patient denies dizziness/faint feelings when O2 saturation drops to 87. Pt does pant when she knows O2 is being measured. Patient also manipulates the pulse ox probe during assessment, pt advised to hold hand flat to chest to eliminate movement of the probe which does result In better readings; but compliance is short-lived.

## 2017-10-04 NOTE — Progress Notes (Signed)
Patient resting comfortably during shift report. Denies complaints.  

## 2017-10-04 NOTE — Progress Notes (Addendum)
Family Medicine Teaching Service Daily Progress Note Intern Pager: [phone]  Patient name: [name] Medical record number: [id number] Date of birth: [birth year] Age: 52 y.o. Gender: female  Primary Care Provider: [name], MD Consultants: None Code Status: Full  Pt Overview and Major Events to Date:  [name] is a 52 y.o. female presenting with dyspnea for the past several months and new oxygen requirement. PMH is significant for HTN, COPD, CKD IV, T2DM, depression/anxiety, and OSA.   Assessment and Plan:  Acute on chronic hypoxic respiratory failure.  Improving.  Patient without any shortness of breath.  According to a nursing report she did not qualify for home oxygen as she was maintaining her oxygenation above 91% with ambulation.  Patient really wants to go home with oxygen, stating that nobody walked her yesterday and checked her oxygen level.  Using nasal cannula with 100% saturation.  Little upset when tried to cut down her oxygen. Wants to go home today. -We will recheck her saturation with ambulation today. -Continue CPAP at bedtime. -Continue home dose of Singulair, Trelegy and albuterol.  Acute on chronic R sided HF.  Continue to diurese with decrease in weight of about 1 pound over the last 24-hour, total output of -6650.  Creatinine increased to 1.51, continue to rise during this admission.  Has quite variable creatinine over the past year between 1.03-2.63. -Patient can be discharged home on Lasix 40 mg twice daily. -Need a close follow-up. -Continue home dose of atorvastatin and losartan.  Hypertension:  Blood pressure little elevated this morning. Continue home dose of losartan and amlodipine.  Pulmonary nodules: 6 mm ill-defined nodule in the RUL. 36mm smaller irregular and denser nodule in the LULmeasuring most likely scarring.  -Non-contrast chest CT at 6-12 months is recommended.  -If stable at repeat, CT in 18-14 from CT 9/4 recommended if high risk    Depression w/ Anxiety: Stable. Denies SI/HI.  -Cont home citalopram 40  and buspirone 10 TID    Type 2 Diabetes Mellitus: Stable.  CBG between 126-177 over the last 24-hour. -Continue Lantus 20 units in the morning. -Continue SSI while in the hospital.  OSA.  Use CPAP last night. Encouraged to continue using her home CPAP.  Hyperlipidemia: Stable.  -Cont home atorvastatin   Previous Tobacco use:  Quit 1 month ago, on chantix BID with compliance.  -Cont Chantix   GERD: Stable.  Not endorsing symptoms currently.  -Can provided omeprazole if needed   Chronic pain: Stable.  - Norco 7.5-325 1-2 tablets q 6 PRN   -Cont home gabapentin, baclofen, voltaren gel   Seasonal allergies: Stable.  -Cont home montelukast, loratadine, and flonase   Overactive bladder: Stable -hold home Oxybutynin in setting of achieving diuresis    Hepatitis B: Following with ID, has follow up appointment 11/2017.  -No intervention at this time, ensure f/u outpatient   Gout: Chronic, stable  -Cont home allopurinol 100mg  daily   FEN/GI: Heart healthy/carb modified, scheduled colace  PPx: Lovenox  Disposition: Will be discharged today.  Subjective:  Patient was sitting comfortably in her bed when seen this morning.  Denies any shortness of breath and wants to go home.  She was upset that she does not qualify for home oxygen, stating that nobody walked her yesterday and this morning she did become short of breath without oxygen.  She was using oxygen at 2 L via nasal cannula when seen this morning and was very reluctant to decrease or taken it out.  She was also having a lot of complaints regarding nursing staff.  Objective: Temp:  [97.5 F (36.4 C)-97.8 F (36.6 C)] 97.8 F (36.6 C) (09/07 0615) Pulse Rate:  [63-65] 65 (09/07 0615) Resp:  [18] 18 (09/07 0615) BP: (117-159)/(63-83) 159/83 (09/07 0615) SpO2:  [92 %-95 %] 92 % (09/07 0615) Weight:  [181.2 kg] 181.2 kg (09/07 0615) Physical  Exam: General: Well-developed, obese lady, in no acute distress. Cardiovascular: Regular rate and rhythm, no murmur/gallop. Respiratory: Mildly decreased breath sound at bases, most likely secondary to body habitus. Abdomen: Soft, obese, nontender, bowel sounds positive. Extremities: 1+ lower extremity edema bilaterally, pulses intact and symmetrical.  Laboratory: Recent Labs  Lab 10/01/17 1053 10/02/17 0854 10/03/17 0610  WBC 11.9* 11.2* 9.9  HGB 16.2* 16.3* 15.0  HCT 48.0* 47.3* 45.0  PLT 350 330 301   Recent Labs  Lab 09/30/17 1331  10/01/17 1053 10/02/17 0854 10/03/17 0610 10/04/17 0415  NA 140   < > 140 141 140 140  K 4.1   < > 3.8 3.9 3.9 3.5  CL 104   < > 101 102 100 100  CO2 24   < > 26 26 29 28   BUN 13   < > 14 15 20  26*  CREATININE 1.28*   < > 1.31* 1.38* 1.42* 1.51*  CALCIUM 9.4   < > 9.5 9.5 9.2 9.1  PROT 6.8  --  7.2  --   --   --   BILITOT 0.5  --  0.9  --   --   --   ALKPHOS 80  --  83  --   --   --   ALT 31  --  31  --   --   --   AST 32  --  31  --   --   --   GLUCOSE 109*   < > 127* 145* 140* 147*   < > = values in this interval not displayed.    Lorella Nimrod, MD 10/04/2017, 8:25 AM PGY-3, [place] Intern pager: [phone], text pages welcome

## 2017-10-04 NOTE — Plan of Care (Signed)
  Problem: Education: Goal: Knowledge of General Education information will improve Description Including pain rating scale, medication(s)/side effects and non-pharmacologic comfort measures Outcome: Progressing Note:  POC reviewed with pt. and pain management.   

## 2017-10-04 NOTE — Progress Notes (Signed)
Patient's discharge paperwork reviewed, CHF booklet provided/reviewed. Pts son is at bedside to assist staff with wheeling pt down to the car.   Pt has 2 tanks of O2 at bedside; she is set up for [facility] delivery of machine tonight.   Pt discharged without complications/concerns.

## 2017-10-04 NOTE — Progress Notes (Signed)
Page to Care Management for Home O2

## 2017-10-05 ENCOUNTER — Other Ambulatory Visit: Payer: Self-pay | Admitting: Family Medicine

## 2017-10-06 DIAGNOSIS — N319 Neuromuscular dysfunction of bladder, unspecified: Secondary | ICD-10-CM | POA: Diagnosis not present

## 2017-10-06 DIAGNOSIS — R350 Frequency of micturition: Secondary | ICD-10-CM | POA: Diagnosis not present

## 2017-10-07 ENCOUNTER — Ambulatory Visit (INDEPENDENT_AMBULATORY_CARE_PROVIDER_SITE_OTHER): Payer: Medicare Other | Admitting: Family Medicine

## 2017-10-07 ENCOUNTER — Encounter: Payer: Self-pay | Admitting: Family Medicine

## 2017-10-07 ENCOUNTER — Other Ambulatory Visit: Payer: Self-pay

## 2017-10-07 VITALS — BP 116/62 | HR 93 | Temp 97.9°F | Wt >= 6400 oz

## 2017-10-07 DIAGNOSIS — R0683 Snoring: Secondary | ICD-10-CM | POA: Diagnosis not present

## 2017-10-07 DIAGNOSIS — N183 Chronic kidney disease, stage 3 unspecified: Secondary | ICD-10-CM

## 2017-10-07 DIAGNOSIS — I5032 Chronic diastolic (congestive) heart failure: Secondary | ICD-10-CM

## 2017-10-07 DIAGNOSIS — Z23 Encounter for immunization: Secondary | ICD-10-CM

## 2017-10-07 DIAGNOSIS — R911 Solitary pulmonary nodule: Secondary | ICD-10-CM | POA: Insufficient documentation

## 2017-10-07 DIAGNOSIS — N3946 Mixed incontinence: Secondary | ICD-10-CM | POA: Diagnosis not present

## 2017-10-07 DIAGNOSIS — E1122 Type 2 diabetes mellitus with diabetic chronic kidney disease: Secondary | ICD-10-CM

## 2017-10-07 DIAGNOSIS — N819 Female genital prolapse, unspecified: Secondary | ICD-10-CM

## 2017-10-07 DIAGNOSIS — Z794 Long term (current) use of insulin: Secondary | ICD-10-CM

## 2017-10-07 MED ORDER — INSULIN GLARGINE 100 UNIT/ML SOLOSTAR PEN: 22.0000 [IU] | PEN_INJECTOR | Freq: Every day | SUBCUTANEOUS | 3 refills | Status: DC

## 2017-10-07 MED ORDER — OXYBUTYNIN CHLORIDE ER 10 MG PO TB24: 10.0000 mg | ORAL_TABLET | Freq: Every day | ORAL | 0 refills | Status: DC

## 2017-10-07 NOTE — Progress Notes (Signed)
Patient Name: [name] Date of Birth: January 06, 1966 Date of Visit: 10/07/17 PCP: [name], MD  Chief Complaint: Hospital discharge and discuss medication changes  Subjective: [name] is a pleasant 52 y.o. year old type 2 diabetes, morbid obesity, heart failure with preserved ejection fraction, hypertension, and mixed urinary incontinence presenting today for hospital discharge.   The patient was admitted from September 3-7 for an acute exacerbation of heart failure with preserved ejection fraction.She was diuresed with Lasix and discharged with Lasix 40 mg twice daily.  She was discharged with a new requirement for home oxygen therapy when she is walking for which she is to use 2 L.  The patient's primary complaint today is her urinary incontinence.  She reports the oxybutynin worked incredibly well for her.  She denies side effects such as dry mouth and falls.  She would like to restart this medication.  This was stopped at time of discharge from the hospital.  She reports that without this medication she leaks urine both when she is coughing and sneezing or anytime her bladder is any sort of fluid in it.  In terms of her volume status the patient reports she is compliant with Lasix twice daily.  She would also like to restart her hydrochlorothiazide once daily.  She reports that she has no orthopnea, paroxysmal nocturnal dyspnea ,chest pain.  Her baseline exertional dyspnea is unchanged.  She is using her oxygen when she is ambulating at this time.  In terms of her diabetes the patient reports her morning blood sugars range between 130 and 180.  There is never below 130.  She denies polyuria or polydipsia.  The patient requests an increase in her oxygen during the daytime.  She reports she cannot feel this in her nose.  She also requests an increase in her CPAP settings at night.  She is currently on 5 mmHg for pressure setting.  She would like to increase to 7.  She reports that  her prescription is for 5 she has not had a sleep study in some time. ROS:  ROS As above.   I have reviewed the patient's medical, surgical, family, and social history as appropriate.   Vitals:   10/07/17 1057  BP: 116/62  Pulse: 93  Temp: 97.9 F (36.6 C)  SpO2: 94%   Filed Weights   10/07/17 1057  Weight: (!) 404 lb (183.3 kg)  HEENT: Sclera anicteric.  Excision cannula in place Neck: Supple thick  Cardiac: Regular rate and rhythm. Normal S1/S2. No murmurs, rubs, or gallops appreciated. Lungs: Diminished breath sounds bilaterally Abdomen: Obese soft Extremities: 1+ pedal edema Psych: Pleasant and appropriate   Walking oxygen sat ranged between 90 and 92% on 2 L nasal cannula.  At rest she is 99% on 2 L.  Reviewed the imaging obtained during her hospitalization.  Reviewed her CT scan which did show a pulmonary nodule.   [name] was seen today for hospitalization follow-up.  Diagnoses and all orders for this visit:  Snoring recommended titration with new sleep study. -     Ambulatory referral to Sleep Studies  Need for immunization against influenza -     Flu Vaccine QUAD 36+ mos IM  Chronic heart failure with preserved ejection fraction (HCC) appears euvolemic today -     Basic Metabolic Panel -     Magnesium  Urinary incontinence, mixed discussed benefits and risks at length discussed that she has mixed urinary incontinence.  Could consider Myrbetriq in future we  will discuss with pharmacy. -     oxybutynin (DITROPAN XL) 10 MG 24 hr tablet; Take 1 tablet (10 mg total) by mouth at bedtime.  Type 2 diabetes the patient is on aspirin, statin and losartan.  She is not on metformin.  Could consider restarting if creatinine is improving. -     Insulin Glargine (LANTUS SOLOSTAR) 100 UNIT/ML Solostar Pen; Inject 22 Units into the skin daily.  Pulmonary nodule discussed with patient at length that she will need a repeat CT scan for pulmonary nodule.  Plan to repeat in early  March.  Dorris Singh, MD  Family Medicine Teaching Service

## 2017-10-07 NOTE — Assessment & Plan Note (Signed)
Discussed findings on chest CT from hospitalization with patient at length.  Discussed recommendation to follow-up CT scan in 6 months patient aware and will schedule when it is time for this.  The patient has quit smoking at this time.  She does have a 40-pack-year history.

## 2017-10-07 NOTE — Patient Instructions (Signed)
No HCTZ   Go to the lab today     It was wonderful to see you today.  Thank you for choosing [facility].   Please call [phone] with any questions about today's appointment.  Please be sure to schedule follow up at the front  desk before you leave today.   Dorris Singh, MD  Family Medicine

## 2017-10-08 ENCOUNTER — Other Ambulatory Visit (INDEPENDENT_AMBULATORY_CARE_PROVIDER_SITE_OTHER): Payer: Self-pay | Admitting: Specialist

## 2017-10-08 LAB — BASIC METABOLIC PANEL
BUN / CREAT RATIO: 17 (ref 9–23)
BUN: 25 mg/dL — ABNORMAL HIGH (ref 6–24)
CHLORIDE: 101 mmol/L (ref 96–106)
CO2: 23 mmol/L (ref 20–29)
Calcium: 9.8 mg/dL (ref 8.7–10.2)
Creatinine, Ser: 1.51 mg/dL — ABNORMAL HIGH (ref 0.57–1.00)
GFR calc non Af Amer: 39 mL/min/{1.73_m2} — ABNORMAL LOW (ref >59)
GFR, EST AFRICAN AMERICAN: 45 mL/min/{1.73_m2} — AB (ref >59)
GLUCOSE: 115 mg/dL — AB (ref 65–99)
POTASSIUM: 4.5 mmol/L (ref 3.5–5.2)
Sodium: 142 mmol/L (ref 134–144)

## 2017-10-08 LAB — MAGNESIUM: MAGNESIUM: 1.9 mg/dL (ref 1.6–2.3)

## 2017-10-08 MED ORDER — HYDROCODONE-ACETAMINOPHEN 7.5-325 MG PO TABS: 1.0000 | ORAL_TABLET | Freq: Four times a day (QID) | ORAL | 0 refills | Status: DC | PRN

## 2017-10-08 NOTE — Telephone Encounter (Signed)
Patient requesting rx refill on hydrocodone 7.5-325 mg. Patients # [phone]

## 2017-10-09 ENCOUNTER — Encounter: Payer: Self-pay | Admitting: Pharmacist

## 2017-10-09 ENCOUNTER — Other Ambulatory Visit (INDEPENDENT_AMBULATORY_CARE_PROVIDER_SITE_OTHER): Payer: Self-pay | Admitting: Specialist

## 2017-10-09 ENCOUNTER — Ambulatory Visit (INDEPENDENT_AMBULATORY_CARE_PROVIDER_SITE_OTHER): Payer: Medicare Other | Admitting: Pharmacist

## 2017-10-09 DIAGNOSIS — F17200 Nicotine dependence, unspecified, uncomplicated: Secondary | ICD-10-CM

## 2017-10-09 DIAGNOSIS — Z794 Long term (current) use of insulin: Secondary | ICD-10-CM

## 2017-10-09 DIAGNOSIS — E1122 Type 2 diabetes mellitus with diabetic chronic kidney disease: Secondary | ICD-10-CM | POA: Diagnosis not present

## 2017-10-09 DIAGNOSIS — I5032 Chronic diastolic (congestive) heart failure: Secondary | ICD-10-CM

## 2017-10-09 DIAGNOSIS — N183 Chronic kidney disease, stage 3 (moderate): Secondary | ICD-10-CM

## 2017-10-09 MED ORDER — INSULIN GLARGINE 100 UNIT/ML SOLOSTAR PEN: 15.0000 [IU] | PEN_INJECTOR | Freq: Every day | SUBCUTANEOUS | 3 refills | Status: DC

## 2017-10-09 MED ORDER — EMPAGLIFLOZIN 10 MG PO TABS: 10.0000 mg | ORAL_TABLET | Freq: Every day | ORAL | 1 refills | Status: DC

## 2017-10-09 NOTE — Telephone Encounter (Signed)
Pt is aware her rx is ready for pick up at the front desk 

## 2017-10-09 NOTE — Patient Instructions (Addendum)
It was great to see you today! Congrats on your 30 day anniversary of quitting: keep up the amazing work!  1. Start Jardiance 10mg  once a day. 2. Decrease your Lantus (long acting insulin) to 15 units a day. 3. Try to implement a little exercise into your lifestyle to help with weight loss, even if it's for a few more minutes a day. 4. Follow up with Dr. Valentina Lucks next Thursday.

## 2017-10-09 NOTE — Assessment & Plan Note (Signed)
Hypertension/Heart Failure longstanding currently controlled.  BP goal = <130/80 mmHg. Patient is adherent with medication. -Continue losartan 100mg , amlodipine 10mg , lasix 40mg  BID. -Revaluate response to Jardiance and assess hypotension in the future.  Consider dose reduction of amlodipine and potential increase in empagliflozin at next visit.

## 2017-10-09 NOTE — Assessment & Plan Note (Signed)
History of tobacco use disorder, tobacco free for 30 days. Great candidate for continued tobacco cessation due to confidence and importance in quitting, appropriate urge avoidance, and satisfaction with success. -Continued varenicline titration of 0.5 mg by mouth twice a day.  Reassess dosing and potential taper to once daily dosing at the end of 8 weeks (2 months of treatment).

## 2017-10-09 NOTE — Telephone Encounter (Signed)
Baclofen refill request 

## 2017-10-09 NOTE — Assessment & Plan Note (Signed)
Diabetes longstanding, managed with insulin, currently improved. Patient is able to verbalize appropriate hypoglycemia management plan. Patient is adherent with medication.   Given Heart Failure hospitalization recently - adjustment to DM therapy initiated; Jardiance (empagliflozin) 10mg . Counseled on diuretic effect, UTIs/yeast infections (counseled to call), and dehydration. - Decrease (Lantus) insulin glargine from 22 units to 15 units a day. - Ordered BMET -Extensively discussed pathophysiology of DM, recommended lifestyle interventions, dietary effects on glycemic control -Counseled on s/sx of and management of hypoglycemia -Next A1C anticipated 12/10/2017.

## 2017-10-09 NOTE — Progress Notes (Signed)
S:  Chief Complaint  Patient presents with  . Medication Management    Smoking Cessation, Diabetes    Patient arrives in good spirits, but has notable difficulty breathing while ambulating.  Presents for diabetes evaluation, education, and management at the request of Dr. Burr Medico on 08/26/17 for tobacco cessation. Patient was last seen by Dr. Owens Shark post hospital discharge. She was hospitalized for 4 days due to an acute CHF exacerbation, at which point her furosemide was increased to 40mg  BID and was started on 2L oxygen. At her follow-up visit, her primary complaint was urinary incontinence, so she was started on Oxybutinin XL, but reported feeling better after her discharge.  Patient has had a longstanding history of diabetes managed on insulin Patient reports adherence with medications.  Current diabetes medications include: insulin glargine (Lantus) 22 units daily, insulin lispro (Humalog) 3 units TID AC Current hypertension medications include: losartan (Cozaar) 100mg  daily, amlodipine (Norvasc) 10mg  daily, furosemide (Lasix) 40mg  BID  Patient denies hypoglycemic events.   Tobacco Cessation: Reports 30 days, "since "8/11 at 6:29pm", smoke free. Pt has been tolerating Chantix (varenicline)  At 0.5mg  BID well,denies nightmares. She is very satisfied with her progress, and notes her breathing, sense of smell and taste have improved. Reports high-level of confidence in long-term abstinence from tobacco. Since she was placed on oxygen, she also reports improved breathing and less use of albuterol.  Amount of urges while on varenicline: once daily, during lunchtime Possible Triggers: friends who smoke, eating meals, stress or sadness Urge Avoidance: When patient usually gets urges during meals, she distracts herself with prayer. She also forces her friends to not smoke in her house and avoids the smell of smoking.  Heart Failure: Patient reports that her weights have been about the same since  her hospitalization.    O:  Physical Exam  Constitutional: She appears well-developed and well-nourished. She appears distressed.  Musculoskeletal: She exhibits edema.  +1 edema bilaterally around ankles  Vitals reviewed.    Review of Systems  Respiratory: Positive for shortness of breath.   All other systems reviewed and are negative.   Lab Results  Component Value Date   HGBA1C 7.4 (A) 09/09/2017   Vitals:   10/09/17 0858  BP: 130/68    Lipid Panel     Component Value Date/Time   CHOL 246 (H) 05/03/2016 1428   TRIG 172 (H) 05/03/2016 1428   HDL 56 05/03/2016 1428   CHOLHDL 4.4 05/03/2016 1428   CHOLHDL 4.0 04/17/2015 0917   VLDL 34 (H) 04/17/2015 0917   LDLCALC 156 (H) 05/03/2016 1428   LDLDIRECT 114 (H) 09/15/2012 1559    Reports blood sugars are lower at home, lowest value is 102. Checks three times a day.  Clinical ASCVD: No   Risk Factors: Age 66-75, elevated LDL, former smoker 10 year ASCVD Risk: 13.2%  A/P: Diabetes longstanding, managed with insulin, currently improved. Patient is able to verbalize appropriate hypoglycemia management plan. Patient is adherent with medication.   Given Heart Failure hospitalization recently - adjustment to DM therapy initiated; Jardiance (empagliflozin) 10mg . Counseled on diuretic effect, UTIs/yeast infections (counseled to call), and dehydration. - Decrease (Lantus) insulin glargine from 22 units to 15 units a day. - Ordered BMET -Extensively discussed pathophysiology of DM, recommended lifestyle interventions, dietary effects on glycemic control -Counseled on s/sx of and management of hypoglycemia -Next A1C anticipated 12/10/2017.  History of tobacco use disorder, tobacco free for 30 days. Great candidate for continued tobacco cessation due to confidence and  importance in quitting, appropriate urge avoidance, and satisfaction with success. -Continued varenicline titration of 0.5 mg by mouth twice a day.  Reassess dosing  and potential taper to once daily dosing at the end of 8 weeks (2 months of treatment).   ASCVD risk - primary prevention in patient with DM. Last LDL is not controlled; was last measured in 2018. Unsure if patient was adherent to therapy at that time. Patient appropriately managed on high intensity statin.  -Continue atorvastatin 40mg  daily.  -Recheck lipid panel at next visit. If LDL still elevated, consider dose increase   Hypertension longstanding currently controlled.  BP goal = <130/80 mmHg. Patient is adherent with medication. -Continue losartan 100mg , amlodipine 10mg , lasix 40mg  BID. -Evaluate response to Jardiance and assess hypotension in the future.  Consider dose reduction of amlodipine and potential titration of empagliflozin at next visit.   Written patient instructions provided.  Total time in face to face counseling 35 minutes.   Follow up Pharmacist Clinic Visit in 1 week.   Patient seen with Sharyne Peach, PharmD Candidate.

## 2017-10-10 ENCOUNTER — Ambulatory Visit: Payer: Medicare Other | Admitting: Podiatry

## 2017-10-10 ENCOUNTER — Encounter: Payer: Self-pay | Admitting: Family Medicine

## 2017-10-10 ENCOUNTER — Other Ambulatory Visit: Payer: Self-pay | Admitting: Student in an Organized Health Care Education/Training Program

## 2017-10-10 DIAGNOSIS — J302 Other seasonal allergic rhinitis: Secondary | ICD-10-CM

## 2017-10-10 LAB — BASIC METABOLIC PANEL
BUN / CREAT RATIO: 18 (ref 9–23)
BUN: 24 mg/dL (ref 6–24)
CO2: 24 mmol/L (ref 20–29)
CREATININE: 1.31 mg/dL — AB (ref 0.57–1.00)
Calcium: 9.7 mg/dL (ref 8.7–10.2)
Chloride: 100 mmol/L (ref 96–106)
GFR calc Af Amer: 53 mL/min/{1.73_m2} — ABNORMAL LOW (ref >59)
GFR, EST NON AFRICAN AMERICAN: 46 mL/min/{1.73_m2} — AB (ref >59)
Glucose: 106 mg/dL — ABNORMAL HIGH (ref 65–99)
Potassium: 4.2 mmol/L (ref 3.5–5.2)
SODIUM: 140 mmol/L (ref 134–144)

## 2017-10-12 ENCOUNTER — Other Ambulatory Visit: Payer: Self-pay | Admitting: Family Medicine

## 2017-10-12 DIAGNOSIS — E1122 Type 2 diabetes mellitus with diabetic chronic kidney disease: Secondary | ICD-10-CM

## 2017-10-12 DIAGNOSIS — N183 Chronic kidney disease, stage 3 unspecified: Secondary | ICD-10-CM

## 2017-10-12 DIAGNOSIS — Z794 Long term (current) use of insulin: Secondary | ICD-10-CM

## 2017-10-12 DIAGNOSIS — G4733 Obstructive sleep apnea (adult) (pediatric): Secondary | ICD-10-CM | POA: Diagnosis not present

## 2017-10-14 NOTE — Progress Notes (Signed)
Patient ID: [name], female   DOB: 29-Dec-1965, 52 y.o.   MRN: [id number] I reviewed and agree with Dr. Graylin Shiver documentation and management.

## 2017-10-16 ENCOUNTER — Encounter: Payer: Self-pay | Admitting: Pharmacist

## 2017-10-16 ENCOUNTER — Ambulatory Visit (INDEPENDENT_AMBULATORY_CARE_PROVIDER_SITE_OTHER): Payer: Medicare Other | Admitting: Pharmacist

## 2017-10-16 DIAGNOSIS — I5032 Chronic diastolic (congestive) heart failure: Secondary | ICD-10-CM

## 2017-10-16 DIAGNOSIS — F17200 Nicotine dependence, unspecified, uncomplicated: Secondary | ICD-10-CM

## 2017-10-16 DIAGNOSIS — E1122 Type 2 diabetes mellitus with diabetic chronic kidney disease: Secondary | ICD-10-CM

## 2017-10-16 DIAGNOSIS — Z794 Long term (current) use of insulin: Secondary | ICD-10-CM

## 2017-10-16 DIAGNOSIS — N183 Chronic kidney disease, stage 3 unspecified: Secondary | ICD-10-CM

## 2017-10-16 MED ORDER — EMPAGLIFLOZIN 25 MG PO TABS: 25.0000 mg | ORAL_TABLET | Freq: Every day | ORAL | 1 refills | Status: DC

## 2017-10-16 MED ORDER — INSULIN GLARGINE 100 UNIT/ML SOLOSTAR PEN: 10.0000 [IU] | PEN_INJECTOR | Freq: Every day | SUBCUTANEOUS | 3 refills | Status: DC

## 2017-10-16 NOTE — Assessment & Plan Note (Signed)
HFpEF/Hypertension longstanding, BP currently controlled at goal <130/80. Patient notes 3 lb weight gain in the past week. Lower extremity edema bilaterally. Moving forward, patient may benefit from additional diuretic agents, including spironolactone, though BP unlikely to support additional agents at this time. Appropriate to maximize Jardiance benefit and adjust other antihypertensives - Discontinue amlodipine 10 mg daily - Moving forward, consider addition of spironolactone for diuretic benefit, pending BMP results today

## 2017-10-16 NOTE — Patient Instructions (Signed)
It was great to see you today!   We are going to do a few things:  1) Increase Jardiance. I am going to send in a prescription for Jardiance 25 mg, however, you can take 2 of the 10 mg tablets you have at home until you finish that.  2) Stop amlodipine 10 mg daily. We do not want to push your blood pressure too low, and this medication can sometimes increase swelling in your feet and ankles 3) Decrease Lantus to 10 units daily.    It is very important that Humalog be taken 30 minutes - 1 hour before you eat a meal. We do not want your sugar to drop too low if you take this insulin and do not eat. You only need to take the Humalog if you are about to eat a meal.  Keep checking your blood sugars and weighing yourself every day just like you are doing!   Follow up with Dr. Burr Medico next week.

## 2017-10-16 NOTE — Progress Notes (Signed)
Patient ID: [name], female   DOB: 1965/12/23, 52 y.o.   MRN: [id number] Reviewed: Agree with the documentation and management of Dr. Valentina Lucks.

## 2017-10-16 NOTE — Assessment & Plan Note (Signed)
Diabetes: ~1 year in duration, currently improved per SMBG reports. Patient is able to verbalize appropriate hypoglycemia management plan. Patient is adherent with medication, though is occasionally taking mealtime insulin a few hours before eating a meal. Appropriate to maximize Jardiance for diuretic benefit, and adjust other antihyperglycemics to prevent hypoglycemia - Increase Jardiance to 25 mg daily. Patient counseled to take 2 of the 10 mg tablets she has at home to finish out this supply. Will check BMP today s/p Jardiance initiation last week - Decrease Lantus from 15 to 10 units daily.  - Continue Humalog 3 units TID with meals. Educated patient to avoid taking Humalog if she isn't about to eat a meal, and to only take prior to meals.  -Extensively discussed pathophysiology of DM, recommended lifestyle interventions, dietary effects on glycemic control -Counseled on s/sx of and management of hypoglycemia -Next A1C anticipated at future appointments after full Jardiance titration

## 2017-10-16 NOTE — Assessment & Plan Note (Signed)
History of tobacco use disorder, tobacco free for ~ 5 weeks. Great candidate for continued tobacco cessation due to confidence and importance in quitting, appropriate urge avoidance, and satisfaction with success. -Continued varenicline titration of 0.5 mg by mouth twice a day.  Breathing is unchanged, continues to use 2L of oxygen (planned until sleep study).  Reassess dosing and potential taper to once daily dosing at the end of 8 weeks (2 months of treatment).  Consider 0.5mg  daily for 30 days at that time.

## 2017-10-16 NOTE — Progress Notes (Addendum)
S:     Chief Complaint  Patient presents with  . Medication Management    Diabetes, Tobacco Cessation    Patient arrives in good spirits, ambulating without assistance. She is moderately short of breath after walking from the waiting room to the exam room, but this is her baseline.  Presents for diabetes evaluation, education, and management at the request of Dr. Burr Medico on 08/26/17. She was seen by Dr. Owens Shark post-hospital discharge for chronic heart failure on 10/07/2017, and then in Pharmacy clinic on 10/09/2017. At that time, Jardiance 10 mg was initiated to benefit diabetes control and heart failure.  Since then, she notes that her sugar control has been good. She reports some lower readings in the 90s, but notes that this was after she took Humalog and did not eat a full meal. She notes that she checks BG fasting, before lunch, and before supper, and will take her Humalog at that time. However, sometimes she isn't hungry, and it can be a few hours until she eats a meal. She does note one elevated reading of 291, but relates this to having just drank a soft drink.   She denies any s/sx orthostatic hypotension including dizziness/lightheadedness upon sitting up or standing. She denies any genitourinary concerns related to Jardiance initiation. Of note, she is up 3 lbs since her appointment last week. She does note that she has been constipated for the last few days, and plans to take Miralax this weekend to "clean things out".  She proudly reports that she continues to be tobacco free since 09/07/2017.   Patient reports Diabetes was diagnosed in November of 2018.  Insurance coverage/medication affordability: [place] [employer]  Patient reports adherence with medications.  Current diabetes medications include: Lantus 15 units daily, Novolog 3 units TID with meals, Jardiance 10 mg daily Current hypertension/heart failure medications include: amlodipine 10 mg daily, losartan 100  mg daily, furosemide 40 mg BID Current tobacco cessation medications include: varenicline 0.5 mg BID (did not tolerate higher dose d/t hallucinations)  Patient denies hypoglycemic events.   Patient reported dietary habits: Eats 2-3 meals/day  O:  Physical Exam  Constitutional: She appears well-developed and well-nourished.  Musculoskeletal: She exhibits edema (2+ pitting edema bilaterally).  Vitals reviewed.   Review of Systems  All other systems reviewed and are negative.  Lab Results  Component Value Date   HGBA1C 7.4 (A) 09/09/2017   Vitals:   10/16/17 0945  BP: 100/72  Pulse: 73  SpO2: 96%    Lipid Panel     Component Value Date/Time   CHOL 246 (H) 05/03/2016 1428   TRIG 172 (H) 05/03/2016 1428   HDL 56 05/03/2016 1428   CHOLHDL 4.4 05/03/2016 1428   CHOLHDL 4.0 04/17/2015 0917   VLDL 34 (H) 04/17/2015 0917   LDLCALC 156 (H) 05/03/2016 1428   LDLDIRECT 114 (H) 09/15/2012 1559    Home fasting CBG in the past week 130-150s; high random reading of 296, but this was after drinking Sprite; low random reading 91 after taking Humalog w/o eating  Clinical ASCVD: No  ASCVD risk factors : age 2-75, elevated LDL, former smoker 10 year ASCVD risk: 13.2%  A/P: Diabetes: ~1 year in duration, currently improved per SMBG reports. Patient is able to verbalize appropriate hypoglycemia management plan. Patient is adherent with medication, though is occasionally taking mealtime insulin a few hours before eating a meal. Appropriate to maximize Jardiance for diuretic benefit, and adjust other antihyperglycemics to prevent hypoglycemia -  Increase Jardiance to 25 mg daily. Patient counseled to take 2 of the 10 mg tablets she has at home to finish out this supply. Will check BMP today s/p Jardiance initiation last week - Decrease Lantus from 15 to 10 units daily.  - Continue Humalog 3 units TID with meals. Educated patient to avoid taking Humalog if she isn't about to eat a meal, and  to only take prior to meals.  -Extensively discussed pathophysiology of DM, recommended lifestyle interventions, dietary effects on glycemic control -Counseled on s/sx of and management of hypoglycemia -Next A1C anticipated at future appointments after full Jardiance titration  HFpEF/Hypertension longstanding, BP currently controlled at goal <130/80. Patient notes 3 lb weight gain in the past week. Lower extremity edema bilaterally. Moving forward, patient may benefit from additional diuretic agents, including spironolactone, though BP unlikely to support additional agents at this time. Appropriate to maximize Jardiance benefit and adjust other antihypertensives - Discontinue amlodipine 10 mg daily - Moving forward, consider addition of spironolactone for diuretic benefit, pending BMP results today  History of tobacco use disorder, tobacco free for ~ 5 weeks. Great candidate for continued tobacco cessation due to confidence and importance in quitting, appropriate urge avoidance, and satisfaction with success. -Continued varenicline titration of 0.5 mg by mouth twice a day.  Breathing is unchanged, continues to use 2L of oxygen (planned until sleep study).  Reassess dosing and potential taper to once daily dosing at the end of 8 weeks (2 months of treatment).  Consider 0.5mg  daily for 30 days at that time.   Written patient instructions provided.  Total time in face to face counseling 45 minutes.   Follow up PCP Clinic Visit next week.   Patient seen with Catie Darnelle Maffucci, PharmD, PGY2 Pharmacy Resident    BMET - stable - unchanged from previous.

## 2017-10-17 LAB — BASIC METABOLIC PANEL
BUN/Creatinine Ratio: 14 (ref 9–23)
BUN: 18 mg/dL (ref 6–24)
CALCIUM: 9.4 mg/dL (ref 8.7–10.2)
CO2: 24 mmol/L (ref 20–29)
CREATININE: 1.31 mg/dL — AB (ref 0.57–1.00)
Chloride: 103 mmol/L (ref 96–106)
GFR, EST AFRICAN AMERICAN: 53 mL/min/{1.73_m2} — AB (ref >59)
GFR, EST NON AFRICAN AMERICAN: 46 mL/min/{1.73_m2} — AB (ref >59)
Glucose: 109 mg/dL — ABNORMAL HIGH (ref 65–99)
Potassium: 4.5 mmol/L (ref 3.5–5.2)
Sodium: 144 mmol/L (ref 134–144)

## 2017-10-20 ENCOUNTER — Other Ambulatory Visit (INDEPENDENT_AMBULATORY_CARE_PROVIDER_SITE_OTHER): Payer: Self-pay | Admitting: Specialist

## 2017-10-20 ENCOUNTER — Other Ambulatory Visit: Payer: Self-pay | Admitting: Family Medicine

## 2017-10-20 DIAGNOSIS — E1122 Type 2 diabetes mellitus with diabetic chronic kidney disease: Secondary | ICD-10-CM

## 2017-10-20 DIAGNOSIS — N183 Chronic kidney disease, stage 3 unspecified: Secondary | ICD-10-CM

## 2017-10-20 DIAGNOSIS — Z794 Long term (current) use of insulin: Secondary | ICD-10-CM

## 2017-10-20 MED ORDER — HYDROCODONE-ACETAMINOPHEN 7.5-325 MG PO TABS: 1.0000 | ORAL_TABLET | Freq: Four times a day (QID) | ORAL | 0 refills | Status: DC | PRN

## 2017-10-20 NOTE — Telephone Encounter (Signed)
Pt is aware her rx is ready for pick up

## 2017-10-20 NOTE — Telephone Encounter (Signed)
Medication refill  Hydrocodone 7.5

## 2017-10-23 ENCOUNTER — Encounter (INDEPENDENT_AMBULATORY_CARE_PROVIDER_SITE_OTHER): Payer: Self-pay | Admitting: Specialist

## 2017-10-23 ENCOUNTER — Ambulatory Visit (INDEPENDENT_AMBULATORY_CARE_PROVIDER_SITE_OTHER): Payer: Medicare Other | Admitting: Specialist

## 2017-10-23 VITALS — BP 169/94 | HR 74 | Ht 70.0 in | Wt 378.0 lb

## 2017-10-23 DIAGNOSIS — R200 Anesthesia of skin: Secondary | ICD-10-CM | POA: Diagnosis not present

## 2017-10-23 DIAGNOSIS — M65341 Trigger finger, right ring finger: Secondary | ICD-10-CM | POA: Diagnosis not present

## 2017-10-23 DIAGNOSIS — R202 Paresthesia of skin: Secondary | ICD-10-CM | POA: Diagnosis not present

## 2017-10-23 DIAGNOSIS — Z981 Arthrodesis status: Secondary | ICD-10-CM | POA: Diagnosis not present

## 2017-10-23 MED ORDER — GABAPENTIN 300 MG PO CAPS: 600.0000 mg | ORAL_CAPSULE | Freq: Three times a day (TID) | ORAL | 3 refills | Status: DC

## 2017-10-23 NOTE — Patient Instructions (Signed)
You have a pinched nerve in the right arm and possibly the right neck. But due to previous neck surgery it is less likely that the nerve is irritated at the neck alone. You may be having pinching at the level of the right elbow at the funny bone or cubital tunnel . Nerve tests are necessary to localize the nerve pinch to either the neck or the right arm.  Dr. Ernestina Patches with perform the right arm nerve tests EMG/NCV to assess for cervical cause or at the right  Elbow. Increase gabapentin to 600 mg three time per day.

## 2017-10-23 NOTE — Progress Notes (Signed)
Office Visit Note   Patient: [name]           Date of Birth: 11-10-1965           MRN: [id number] Visit Date: 10/23/2017              Requested by: [name], MD [address] [place], [place] [address] PCP: [name], MD   Assessment & Plan: Visit Diagnoses:  1. Numbness and tingling of right arm   2. Status post cervical spinal fusion   3. Trigger finger, right ring finger     Plan: You have a pinched nerve in the right arm and possibly the right neck. But due to previous neck surgery it is less likely that the nerve is irritated at the neck alone. You may be having pinching at the level of the right elbow at the funny bone or cubital tunnel . Nerve tests are necessary to localize the nerve pinch to either the neck or the right arm.  Dr. Ernestina Patches with perform the right arm nerve tests EMG/NCV to assess for cervical cause or at the right  Elbow. Increase gabapentin to 600 mg three time per day.  Follow-Up Instructions: Return in about 4 weeks (around 11/20/2017).   Orders:  Orders Placed This Encounter  Procedures  . Ambulatory referral to Physical Medicine Rehab   Meds ordered this encounter  Medications  . gabapentin (NEURONTIN) 300 MG capsule    Sig: Take 2 capsules (600 mg total) by mouth 3 (three) times daily.    Dispense:  180 capsule    Refill:  3      Procedures: No procedures performed   Clinical Data: No additional findings.   Subjective: Chief Complaint  Patient presents with  . Neck - Follow-up    52 year old female right handed with history of lower back condition and right arm pain. She has had problems with pain in the right neck with radiation into the right arm into the right ulnar 2 digits. She has pain With rotating the neck to the side and with extension. Flexion decreases the neck pain. No bowel or bladder difficulty. Right hand and arm with tingling and stinging pain. She is having difficulty sleeping at night. She is    Trying to find the right position to decrease the pain in the right arm. She feels weak in the right arrm, her hand cramps with writing and her writing is getting worse quality. She is on Oxygen per nasal cannula continiuously and is getting light headed with her heart. She reports that she is being scheduled for gastric bypass surgery. She has stopped smoking, is seeing Dr. Harrell Gave for cardiologist. She has stage 3 kidney failure and is unable to tolerate steriod injection. She has had previous cervical fusion by Dr. [name]  Years ago. Taking gabapentin 600mg  po BID.    Review of Systems  Constitutional: Negative.  Negative for activity change, appetite change, chills, diaphoresis, fatigue, fever and unexpected weight change.  HENT: Negative.  Negative for congestion, dental problem, drooling, ear discharge, ear pain, facial swelling, hearing loss, mouth sores, nosebleeds, postnasal drip, rhinorrhea, sinus pressure, sinus pain, sneezing, sore throat, tinnitus, trouble swallowing and voice change.   Eyes: Negative.   Respiratory: Negative.   Cardiovascular: Negative.   Gastrointestinal: Negative.   Endocrine: Negative.   Genitourinary: Negative.   Musculoskeletal: Negative.   Skin: Negative.   Allergic/Immunologic: Negative.   Neurological: Negative.   Hematological: Negative.   Psychiatric/Behavioral:  Negative.      Objective: Vital Signs: BP (!) 169/94 (BP Location: Left Arm, Patient Position: Sitting)   Pulse 74   Ht 5\' 10"  (1.778 m)   Wt (!) 378 lb (171.5 kg)   LMP 04/10/2006   BMI 54.24 kg/m   Physical Exam  Ortho Exam  Specialty Comments:  No specialty comments available.  Imaging: No results found.   PMFS History: Patient Active Problem List   Diagnosis Date Noted  . Solitary pulmonary nodule 10/07/2017  . Acute on chronic congestive heart failure ([place])   . Acute on chronic right-sided congestive heart failure ([place])   . Acute on chronic respiratory failure  with hypoxemia ([place])   . Leg edema, right 10/01/2017  . Restrictive lung disease secondary to obesity 10/01/2017  . Polycythemia, secondary 10/01/2017  . Oxygen desaturation 09/30/2017  . Dyspnea 09/30/2017  . Hepatitis B surface antigen positive 07/08/2017  . COPD (chronic obstructive pulmonary disease) with chronic bronchitis ([facility]) 06/27/2017  . Chronic kidney disease (CKD), stage IV (severe) ([place]) 01/14/2017  . Type 2 diabetes mellitus with stage 3 chronic kidney disease, with long-term current use of insulin ([place]) 12/12/2016  . Urge incontinence of urine 12/05/2016  . Chronic diastolic CHF (congestive heart failure) ([facility]) 04/07/2012  . GERD 01/26/2010  . Hypertension 08/04/2008  . OBSTRUCTIVE SLEEP APNEA 02/03/2008  . FIBROCYSTIC BREAST DISEASE 10/28/2006  . Hyperlipidemia 03/27/2006  . Morbid obesity with BMI of 70 and over, adult ([place]) 03/27/2006  . TOBACCO DEPENDENCE 03/27/2006  . Depression with anxiety 03/27/2006   Past Medical History:  Diagnosis Date  . Arthritis 04-10-11   hips, shoulders, back  . Asthma   . Bipolar disorder ([place])   . Cancer ([place] [facility]) 1993   cervical, no treatment done, went away per pt  . Cervical dysplasia or atypia 04-10-11   '93- once dx.-got pregnant-no intervention, then postpartum, no dysplasia found  . CHF (congestive heart failure) ([facility] [facility])    no cardiologist 2014 dx, none now  . 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 Pathology showed Condyloma, gluteal CONDYLOMA ACUMINATUM.   [name] Kitchen Dyspnea    with actity - better since taking Singular  . Fall 12/16/2016  . Fibrocystic breast disease   . Hypercalcemia   . Hyperlipidemia   . Hypertension   . Migraine headache    none recent  . Morbid obesity ([facility]) 03/27/2006  . Neuropathy   . Skin lesion 03/15/2011   In gluteal crease now s/p removal by Dr. [name] of General Surgery on 3/12. Path shows condyloma.     . Sleep apnea 04-10-11   uses cpap, pt does not know settings  .  Trigger finger    left third    Family History  Problem Relation Age of Onset  . Stroke Father   . Cancer Maternal Aunt        breast  . Asthma Other   . COPD Other   . Hypertension Other   . Diabetes Other     Past Surgical History:  Procedure Laterality Date  . ANAL FISTULECTOMY  04/17/2011   Procedure: FISTULECTOMY ANAL;  Surgeon: Imogene Burn. [name], MD;  Location: [facility];  Service: General;  Laterality: N/A;  Excision of Condyloma Gluteal Cleft   . BACK SURGERY  04-10-11   x5-Lumbar fusion-retained hardware.(Dr. Louanne Skye)  . BREAST CYST EXCISION     bilateral breast, 3 cysts removed from each breast  . BREAST EXCISIONAL BIOPSY Right    x 3  .  BREAST EXCISIONAL BIOPSY Left    x 3  . CARPAL TUNNEL RELEASE Bilateral   . Cervical biospy    . COLONOSCOPY WITH PROPOFOL N/A 07/06/2015   Procedure: COLONOSCOPY WITH PROPOFOL;  Surgeon: [name], MD;  Location: [facility];  Service: Endoscopy;  Laterality: N/A;  . DOPPLER ECHOCARDIOGRAPHY  04/06/2012   AT [facility] 55-60%  . I&D EXTREMITY Left 12/18/2016   Procedure: IRRIGATION AND DEBRIDEMENT LEFT FOOT, CLOSURE;  Surgeon: [name], MD;  Location: [facility];  Service: Orthopedics;  Laterality: Left;  [name] Kitchen MULTIPLE TOOTH EXTRACTIONS    . NECK SURGERY  04-10-11   x3- cervical fusion with plating and screws-Dr. [name]  . TEE WITHOUT CARDIOVERSION N/A 04/06/2012   Procedure: TRANSESOPHAGEAL ECHOCARDIOGRAM (TEE);  Surgeon: [name], MD;  Location: [facility];  Service: Cardiovascular;  Laterality: N/A;  . TRIGGER FINGER RELEASE Right    middle finger  . TRIGGER FINGER RELEASE Left 06/28/2016   Procedure: RELEASE TRIGGER FINGER LEFT 3RD FINGER;  Surgeon: [name], MD;  Location: [facility];  Service: Orthopedics;  Laterality: Left;  . TRIGGER FINGER RELEASE Right 06/12/2016   Procedure: RIGHT INDEX FINGER TRIGGER RELEASE;  Surgeon: [name], MD;  Location: [facility];  Service: Orthopedics;  Laterality: Right;   Social History     Occupational History  . Not on file  Tobacco Use  . Smoking status: Former Smoker    Packs/day: 1.00    Years: 39.00    Pack years: 39.00    Types: Cigarettes    Last attempt to quit: 09/07/2017    Years since quitting: 0.1  . Smokeless tobacco: Never Used  . Tobacco comment: Quit prior to hospitalization using low dose Chantix  Substance and Sexual Activity  . Alcohol use: No    Alcohol/week: 0.0 standard drinks  . Drug use: Not Currently    Types: Marijuana, "Crack" cocaine    Comment: hx marijuana usemany years ago, Crack -none since 11/2015  . Sexual activity: Yes    Partners: Male

## 2017-10-24 ENCOUNTER — Other Ambulatory Visit: Payer: Self-pay

## 2017-10-24 ENCOUNTER — Ambulatory Visit (INDEPENDENT_AMBULATORY_CARE_PROVIDER_SITE_OTHER): Payer: Medicare Other | Admitting: Student in an Organized Health Care Education/Training Program

## 2017-10-24 ENCOUNTER — Encounter: Payer: Self-pay | Admitting: Student in an Organized Health Care Education/Training Program

## 2017-10-24 DIAGNOSIS — Z01818 Encounter for other preprocedural examination: Secondary | ICD-10-CM

## 2017-10-24 NOTE — Patient Instructions (Signed)
I will fill out and fax your paperwork.  Our clinic's number is [phone]. Please call with questions or concerns about what we discussed today.  Be well, Dr. Burr Medico

## 2017-10-24 NOTE — Progress Notes (Signed)
   CC: paperwork for bariatric surgery  HPI: [name] is a 52 y.o. female   Patient presents today with paperwork to be completed for bariatric surgery indicating her ongoing efforts at conservative measures for weight loss.   Obesity:  Diet Patient has tried to lose weight by making changes to her diet. Has been focusing on dietary changes. She has focused on reducing portion sizes. Has focused on shrimp and fish. Makes everything in the oven. Eats a lot of broccoli. Has decreased sugar intake. Exercise - Walks laps around her house. Does use oxygen.  Mood: Patient endorses improvement since her last visit. She is motivated by her recent success in quitting smoking and she is motivated to move towards bariatric surgery (gastric bypass)  Review of Symptoms:  See HPI for ROS.   CC, SH/smoking status, and VS noted.  Objective: BP 132/64   Pulse 72   Temp 98.4 F (36.9 C) (Oral)   Ht 5\' 10"  (1.778 m)   Wt (!) 406 lb 9.6 oz (184.4 kg)   LMP 04/10/2006   SpO2 96%   BMI 58.34 kg/m  GEN: NAD, alert, cooperative, and pleasant, oxygen in place RESPIRATORY: comfortable work of breathing, O2 by [place] LS:LHTDSKA rate noted GI: non distended, obese SKIN: warm and dry, no rashes or lesions NEURO: II-XII grossly intact, normal gait, peripheral sensation intact PSYCH: AAOx3, appropriate affect   Assessment and plan:  Obesity, BMI 58.34 - patient is working towards bariatric surgery. She asks if I can write a letter in support of her weight loss efforts and documenting her previous weight loss efforts. Some of her weight loss efforts are documented in previous office visits. -  On 03/20/2017 my office note indicates taht she had bought a food scale and small bowls for measuring appropriate portion sizes, and she had been working on baking food at home rather than eating out. - On 04/23/2017 she indicated interest in bariatric weight loss surgery. Continued weight loss efforts were  encouraged - Patient has continued to attempt weight loss since these documented measures were taken with little success. I fully support her attempts to undergo surgical weight loss surgery. - Will fill out paperwork that she brought in to the office today re: bariatric surgery and conservative weight loss efforts  [name], MD,MS,  PGY3 10/24/2017 2:33 PM

## 2017-10-26 ENCOUNTER — Other Ambulatory Visit: Payer: Self-pay | Admitting: Internal Medicine

## 2017-10-26 DIAGNOSIS — R0789 Other chest pain: Secondary | ICD-10-CM

## 2017-10-26 DIAGNOSIS — R600 Localized edema: Secondary | ICD-10-CM

## 2017-10-26 DIAGNOSIS — E8770 Fluid overload, unspecified: Secondary | ICD-10-CM

## 2017-10-27 ENCOUNTER — Other Ambulatory Visit (INDEPENDENT_AMBULATORY_CARE_PROVIDER_SITE_OTHER): Payer: Self-pay | Admitting: Specialist

## 2017-10-27 ENCOUNTER — Encounter: Payer: Self-pay | Admitting: Cardiology

## 2017-10-27 ENCOUNTER — Ambulatory Visit (INDEPENDENT_AMBULATORY_CARE_PROVIDER_SITE_OTHER): Payer: Medicare Other | Admitting: Cardiology

## 2017-10-27 ENCOUNTER — Other Ambulatory Visit: Payer: Self-pay | Admitting: Internal Medicine

## 2017-10-27 VITALS — BP 130/80 | HR 81 | Ht 70.0 in | Wt >= 6400 oz

## 2017-10-27 DIAGNOSIS — Z7189 Other specified counseling: Secondary | ICD-10-CM | POA: Diagnosis not present

## 2017-10-27 DIAGNOSIS — E7800 Pure hypercholesterolemia, unspecified: Secondary | ICD-10-CM

## 2017-10-27 DIAGNOSIS — Z794 Long term (current) use of insulin: Secondary | ICD-10-CM

## 2017-10-27 DIAGNOSIS — E1122 Type 2 diabetes mellitus with diabetic chronic kidney disease: Secondary | ICD-10-CM

## 2017-10-27 DIAGNOSIS — E662 Morbid (severe) obesity with alveolar hypoventilation: Secondary | ICD-10-CM

## 2017-10-27 DIAGNOSIS — I5032 Chronic diastolic (congestive) heart failure: Secondary | ICD-10-CM

## 2017-10-27 DIAGNOSIS — R079 Chest pain, unspecified: Secondary | ICD-10-CM | POA: Diagnosis not present

## 2017-10-27 DIAGNOSIS — G4733 Obstructive sleep apnea (adult) (pediatric): Secondary | ICD-10-CM

## 2017-10-27 DIAGNOSIS — J9621 Acute and chronic respiratory failure with hypoxia: Secondary | ICD-10-CM

## 2017-10-27 DIAGNOSIS — N183 Chronic kidney disease, stage 3 (moderate): Secondary | ICD-10-CM

## 2017-10-27 DIAGNOSIS — Z6841 Body Mass Index (BMI) 40.0 and over, adult: Secondary | ICD-10-CM

## 2017-10-27 DIAGNOSIS — I10 Essential (primary) hypertension: Secondary | ICD-10-CM | POA: Diagnosis not present

## 2017-10-27 DIAGNOSIS — Z79899 Other long term (current) drug therapy: Secondary | ICD-10-CM

## 2017-10-27 LAB — LIPID PANEL
CHOL/HDL RATIO: 2.6 ratio (ref 0.0–4.4)
Cholesterol, Total: 145 mg/dL (ref 100–199)
HDL: 55 mg/dL (ref >39)
LDL CALC: 70 mg/dL (ref 0–99)
Triglycerides: 98 mg/dL (ref 0–149)
VLDL Cholesterol Cal: 20 mg/dL (ref 5–40)

## 2017-10-27 NOTE — Telephone Encounter (Signed)
Patient called stated needs refill for Hydrocodone.  Please all patient to advise.

## 2017-10-27 NOTE — Telephone Encounter (Signed)
Sent request to Dr. Nitka 

## 2017-10-27 NOTE — Progress Notes (Signed)
Cardiology Office Note:    Date:  09/10/4816   ID:  SHATYRA BECKA, DOB [birth year], MRN [id number]  PCP:  [name], MD  Cardiologist:  [name], MD PhD  Referring MD: [name], MD   Chief Complaint  Patient presents with  . Shortness of Breath    when active  . Chest Pain    tighteness  . Edema    left side swelling    History of Present Illness:    [name] is a 52 y.o. female with a hx of morbid obesity (BMI 58.8), type II diabetes, heart failure with preserved ejection fraction, hypertension who is seen as a new consult at the request of [name], MD for the evaluation and management of shortness of breath, chest tightness, and edema.  She was recently admitted from 9/3-9/4 for acute on chronic diastolic heart failure. She was discharged on furosemide 40 mg BID and home oxygen of 2L when ambulating. She has sleep apnea and uses CPAP at home.   If weight readings are accurate, she is up 31 lbs from a week ago (she reports recent 378 was not accurate). Her discharge weight was 181.2 kg on 9/6, and today she is 185.9 kg.  Weighs herself at home every morning, range has been 405-409 since discharge. Has overall been feeling better since discharge.  Her concerns: wants gastric bypass surgery, wants to discuss her heart in relation to that. Spent time discussing diastolic dysfunction with her using the heart model.  Breathing is "terrible." When she walks, feels very short of breath, gets tightness in her chest. She has to stop, even with the oxygen on. Can walk from her living room to kitchen (short distance) before she has to stop. Even putting on her clothes causes her to be winded.  Chest pain: -Initial onset: about 4 mos ago -Quality: tightness, central chest area, does not radiate. Associated with shortness of breath, used to have lightheadedness but this has resolved -Frequency: multiple times per day -Duration: minutes -Triggers: exertion,  occasionally at rest. -Aggravating/alleviating factors: worse with exertion, better with deep breathing and with rest -Prior cardiac history: diastolic dysfunction on echo, clinical HFpEF with hospitalizations -Prior ECG:  -Prior workup: echoes as above. -Prior treatment: none -Alcohol: none -Tobacco: quit 09/08/17. Was 1 ppd x 40 years.  -Comorbidities: morbid obesity, type II diabetes, hypertension -Exercise level: none -Labs: TSH , kidney function/electrolytes , CBC . -Cardiac ROS: no PND, no orthopnea, no increase in LE edema, no syncope -Family history: no CAD known. Father died age 51 from stroke, mother died age 7 of blood clot. Has a 78 year old child with sickle cell disease.  Past Medical History:  Diagnosis Date  . Arthritis 04-10-11   hips, shoulders, back  . Asthma   . Bipolar disorder ([place])   . Cancer ([place]) 1993   cervical, no treatment done, went away per pt  . Cervical dysplasia or atypia 04-10-11   '93- once dx.-got pregnant-no intervention, then postpartum, no dysplasia found  . CHF (congestive heart failure) ([place])    no cardiologist 2014 dx, none now  . 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 Pathology showed Condyloma, gluteal CONDYLOMA ACUMINATUM.   [name] Kitchen Dyspnea    with actity - better since taking Singular  . Fall 12/16/2016  . Fibrocystic breast disease   . Hypercalcemia   . Hyperlipidemia   . Hypertension   . Migraine headache    none recent  .  Morbid obesity ([place]) 03/27/2006  . Neuropathy   . Skin lesion 03/15/2011   In gluteal crease now s/p removal by Dr. [name] of General Surgery on 3/12. Path shows condyloma.     . Sleep apnea 04-10-11   uses cpap, pt does not know settings  . Trigger finger    left third    Past Surgical History:  Procedure Laterality Date  . ANAL FISTULECTOMY  04/17/2011   Procedure: FISTULECTOMY ANAL;  Surgeon: Imogene Burn. [name], MD;  Location: [facility];  Service: General;  Laterality: N/A;  Excision of  Condyloma Gluteal Cleft   . BACK SURGERY  04-10-11   x5-Lumbar fusion-retained hardware.(Dr. Louanne Skye)  . BREAST CYST EXCISION     bilateral breast, 3 cysts removed from each breast  . BREAST EXCISIONAL BIOPSY Right    x 3  . BREAST EXCISIONAL BIOPSY Left    x 3  . CARPAL TUNNEL RELEASE Bilateral   . Cervical biospy    . COLONOSCOPY WITH PROPOFOL N/A 07/06/2015   Procedure: COLONOSCOPY WITH PROPOFOL;  Surgeon: [name], MD;  Location: [facility];  Service: Endoscopy;  Laterality: N/A;  . DOPPLER ECHOCARDIOGRAPHY  04/06/2012   AT [place] 55-60%  . I&D EXTREMITY Left 12/18/2016   Procedure: IRRIGATION AND DEBRIDEMENT LEFT FOOT, CLOSURE;  Surgeon: [name], MD;  Location: [place];  Service: Orthopedics;  Laterality: Left;  [name] Kitchen MULTIPLE TOOTH EXTRACTIONS    . NECK SURGERY  04-10-11   x3- cervical fusion with plating and screws-Dr. [name]  . TEE WITHOUT CARDIOVERSION N/A 04/06/2012   Procedure: TRANSESOPHAGEAL ECHOCARDIOGRAM (TEE);  Surgeon: [name], MD;  Location: [facility] ENDOSCOPY;  Service: Cardiovascular;  Laterality: N/A;  . TRIGGER FINGER RELEASE Right    middle finger  . TRIGGER FINGER RELEASE Left 06/28/2016   Procedure: RELEASE TRIGGER FINGER LEFT 3RD FINGER;  Surgeon: [name], MD;  Location: [place];  Service: Orthopedics;  Laterality: Left;  . TRIGGER FINGER RELEASE Right 06/12/2016   Procedure: RIGHT INDEX FINGER TRIGGER RELEASE;  Surgeon: [name], MD;  Location: [place];  Service: Orthopedics;  Laterality: Right;    Current Medications: Current Outpatient Medications on File Prior to Visit  Medication Sig  . ACCU-CHEK AVIVA PLUS test strip TEST 4 TIMES DAILY  . ACCU-CHEK SOFTCLIX LANCETS lancets Use as instructed  . allopurinol (ZYLOPRIM) 100 MG tablet Take 100 mg by mouth daily.  [name] Kitchen ammonium lactate (AMLACTIN) 12 % lotion Apply 1 application topically 2 (two) times daily as needed for dry skin.  [name] Kitchen atorvastatin (LIPITOR) 40 MG tablet Take 1 tablet (40 mg  total) daily by mouth.  . baclofen (LIORESAL) 10 MG tablet TAKE 2 TABLETS (20 MG TOTAL) BY MOUTH 3 (THREE) TIMES DAILY.  . blood glucose meter kit and supplies KIT Dispense based on patient and insurance preference. Use up to four times daily as directed. (FOR ICD-9 250.00, 250.01).  . busPIRone (BUSPAR) 10 MG tablet Take 1 tablet (10 mg total) by mouth 3 (three) times daily.  . Capsaicin-Menthol (PAIN RELIEF EX) Apply 1 application topically 3 (three) times daily as needed (shoulder pain).  . citalopram (CELEXA) 40 MG tablet Take 1 tablet (40 mg total) by mouth daily.  . diclofenac sodium (VOLTAREN) 1 % GEL APPLY 2 GRAMS TO AFFECTED AREA 4 TIMES A DAY  . docusate sodium (COLACE) 100 MG capsule Take 200 mg by mouth 2 (two) times daily.  . empagliflozin (JARDIANCE) 25 MG TABS tablet Take 25 mg by mouth  daily.  . famotidine (PEPCID) 10 MG tablet Take 1 tablet (10 mg total) by mouth daily.  . fluticasone (FLONASE) 50 MCG/ACT nasal spray USE 2 SPRAYS EACH NOSTRIL TWICE DAILY.  . furosemide (LASIX) 40 MG tablet Take 1 tablet (40 mg total) by mouth 2 (two) times daily.  [name] Kitchen gabapentin (NEURONTIN) 300 MG capsule TAKE 2 CAPSULES TWICE A DAY  . gabapentin (NEURONTIN) 300 MG capsule Take 2 capsules (600 mg total) by mouth 3 (three) times daily.  [name] Kitchen HUMALOG KWIKPEN 100 UNIT/ML KiwkPen INJECT 3 UNITS INTO THE SKIN 3 TIMES DAILY BEFORE MEALS  . HYDROcodone-acetaminophen (NORCO) 7.5-325 MG tablet Take 1-2 tablets by mouth every 6 (six) hours as needed for moderate pain or severe pain.  . hydrOXYzine (VISTARIL) 100 MG capsule Take 1 capsule (100 mg total) by mouth 3 (three) times daily as needed for itching.  . Insulin Glargine (LANTUS SOLOSTAR) 100 UNIT/ML Solostar Pen Inject 10 Units into the skin daily.  . insulin lispro (HUMALOG KWIKPEN) 100 UNIT/ML KiwkPen Inject 0.03 mLs (3 Units total) into the skin 3 (three) times daily before meals.  . Insulin Pen Needle (B-D UF III MINI PEN NEEDLES) 31G X 5 MM MISC CHECK  SUGARS 4 TIMES A DAY BEFORE MEALS AND AT BEDTIME.  [name] Kitchen ketoconazole (NIZORAL) 2 % cream Apply 1 fingertip amount to each foot daily. (Patient taking differently: Apply 1 application topically 2 (two) times daily. Apply 1 fingertip amount to each foot TWICE DAILY)  . loratadine (CLARITIN) 10 MG tablet Take 1 tablet (10 mg total) by mouth daily.  [name] Kitchen losartan (COZAAR) 100 MG tablet Take 1 tablet (100 mg total) by mouth daily.  . montelukast (SINGULAIR) 10 MG tablet Take 1 tablet (10 mg total) by mouth at bedtime. AS DIRECTED  . Multiple Vitamins-Minerals (MULTIVITAMIN WITH MINERALS) tablet Take 1 tablet by mouth daily.  . mupirocin ointment (BACTROBAN) 2 % Apply 1 application topically 2 (two) times daily.  . NON FORMULARY Uses a C-PAP at bedtime  . nystatin (MYCOSTATIN) 100000 UNIT/ML suspension Take 5 mLs (500,000 Units total) by mouth 4 (four) times daily.  [name] Kitchen omeprazole (PRILOSEC) 20 MG capsule Take 1 capsule (20 mg total) by mouth daily as needed. (Patient taking differently: Take 20 mg by mouth daily. )  . oxybutynin (DITROPAN XL) 10 MG 24 hr tablet Take 1 tablet (10 mg total) by mouth at bedtime.  . TRELEGY ELLIPTA 100-62.5-25 MCG/INH AEPB INHALE 100 MCG INTO THE LUNGS DAILY.  [name] Kitchen varenicline (CHANTIX) 0.5 MG tablet Take 1 tablet (0.5 mg total) by mouth 2 (two) times daily. One daily x 3 days BID  . Vitamin D, Ergocalciferol, (DRISDOL) 50000 units CAPS capsule Take 50,000 Units by mouth every 7 (seven) days. MONDAYS  . albuterol (PROAIR HFA) 108 (90 Base) MCG/ACT inhaler USE 1-2 PUFFS INTO LUNGS EVERY 4 HOURS AS NEEDED FOR WHEEZING OR SHORTNESS OF BREATH. (Patient not taking: Reported on 10/27/2017)  . [DISCONTINUED] sucralfate (CARAFATE) 1 G tablet Take 1 tablet (1 g total) by mouth daily.   No current facility-administered medications on file prior to visit.      Allergies:   Chantix [varenicline tartrate]; Methadone hcl; and Levofloxacin   Social History   Socioeconomic History  . Marital  status: Divorced    Spouse name: Not on file  . Number of children: Not on file  . Years of education: Not on file  . Highest education level: Not on file  Occupational History  . Not on file  Social Needs  .  Financial resource strain: Not on file  . Food insecurity:    Worry: Never true    Inability: Often true  . Transportation needs:    Medical: Not on file    Non-medical: Not on file  Tobacco Use  . Smoking status: Former Smoker    Packs/day: 1.00    Years: 39.00    Pack years: 39.00    Types: Cigarettes    Last attempt to quit: 09/07/2017    Years since quitting: 0.1  . Smokeless tobacco: Never Used  . Tobacco comment: Quit prior to hospitalization using low dose Chantix  Substance and Sexual Activity  . Alcohol use: No    Alcohol/week: 0.0 standard drinks  . Drug use: Not Currently    Types: Marijuana, "Crack" cocaine    Comment: hx marijuana usemany years ago, Crack -none since 11/2015  . Sexual activity: Yes    Partners: Male  Lifestyle  . Physical activity:    Days per week: Not on file    Minutes per session: Not on file  . Stress: Not on file  Relationships  . Social connections:    Talks on phone: Not on file    Gets together: Not on file    Attends religious service: Not on file    Active member of club or organization: Not on file    Attends meetings of clubs or organizations: Not on file    Relationship status: Not on file  Other Topics Concern  . Not on file  Social History Narrative   Current Social History 01/23/2017        Who lives at home: Patient lives alone in one level home 01/23/2017    Transportation: Patient has own vehicle and drives herself 54/65/0354   Important Relationships "Family and friends" 01/23/2017    Pets: None 01/23/2017   Education / Work:  12 th grade/ None 01/23/2017   Interests / Fun: "Play bingo on phone, watch TV, Be with family and friends, sit on my porch." 01/23/2017   Current Stressors: None 01/23/2017    Religious / Personal Beliefs: "God Jesus" 01/23/2017   Other: "I love everyone, I wake up with joy in my heart and go to sleep the same way. I'm a lovable person." 01/23/2017   L. Ducatte, RN, BSN                                                                                                   Family History: The patient's family history includes Asthma in her other; COPD in her other; Cancer in her maternal aunt; Diabetes in her other; Hypertension in her other; Stroke in her father.  ROS:   Please see the history of present illness.  Additional pertinent ROS: Constitutional: Negative for chills, fever, night sweats, unintentional weight loss  HENT: Negative for ear pain and hearing loss.   Eyes: Negative for loss of vision and eye pain.  Respiratory: Positive for shortness of breath. Negative for cough, sputum,  wheezing.   Cardiovascular: Positive for chest tightness, mild LE edema. Negative for palpitations, PND, orthopnea (sleeps on right  side due to back), and claudication.  Gastrointestinal: Negative for abdominal pain, melena, and hematochezia.  Genitourinary: Negative for dysuria and hematuria.  Musculoskeletal: Negative for falls. Positive for muscle spasms. Skin: Negative for itching and rash.  Neurological: Negative for focal weakness, focal sensory changes and loss of consciousness.  Endo/Heme/Allergies: Does not bruise/bleed easily.   EKGs/Labs/Other Studies Reviewed:    The following studies were reviewed today: Echo 9.4.19 Study Conclusions  - Left ventricle: The cavity size was normal. Wall thickness was   normal. Systolic function was normal. The estimated ejection   fraction was in the range of 55% to 60%. Wall motion was normal;   there were no regional wall motion abnormalities. Doppler   parameters are consistent with abnormal left ventricular   relaxation (grade 1 diastolic dysfunction).  EKG:  EKG is ordered today.  The ekg ordered today demonstrates  normal sinus rhythm, poor R wave progression  Recent Labs: 09/30/2017: B Natriuretic Peptide 45.0 10/01/2017: ALT 31; TSH 0.419 10/03/2017: Hemoglobin 15.0; Platelets 301 10/07/2017: Magnesium 1.9 10/16/2017: BUN 18; Creatinine, Ser 1.31; Potassium 4.5; Sodium 144  Recent Lipid Panel    Component Value Date/Time   CHOL 246 (H) 05/03/2016 1428   TRIG 172 (H) 05/03/2016 1428   HDL 56 05/03/2016 1428   CHOLHDL 4.4 05/03/2016 1428   CHOLHDL 4.0 04/17/2015 0917   VLDL 34 (H) 04/17/2015 0917   LDLCALC 156 (H) 05/03/2016 1428   LDLDIRECT 114 (H) 09/15/2012 1559    Physical Exam:    VS:  BP 130/80   Pulse 81   Ht 5' 10" (1.778 m)   Wt (!) 409 lb 12.8 oz (185.9 kg)   LMP 04/10/2006   BMI 58.80 kg/m     Wt Readings from Last 3 Encounters:  10/27/17 (!) 409 lb 12.8 oz (185.9 kg)  10/24/17 (!) 406 lb 9.6 oz (184.4 kg)  10/23/17 (!) 378 lb (171.5 kg)     GEN: Well nourished, well developed in no acute distress. O2 via nasal cannula in place. HEENT: Normal NECK: No apparent JVD (difficult due to body habitus); No carotid bruits LYMPHATICS: No lymphadenopathy CARDIAC: distant heart sounds, regular rhythm, normal S1 and S2, no murmurs, rubs, gallops. Radial and DP pulses 2+ bilaterally. RESPIRATORY:  Distant but clear to auscultation without rales, wheezing or rhonchi  ABDOMEN: Soft, non-tender, non-distended MUSCULOSKELETAL:  No pitting edema, but does have some chronic brawny edema bilaterally; No deformity  SKIN: Warm and dry NEUROLOGIC:  Alert and oriented x 3 PSYCHIATRIC:  Normal affect   ASSESSMENT:    1. Chest pain, unspecified type   2. Counseling on health promotion and disease prevention   3. Class 3 severe obesity due to excess calories with serious comorbidity and body mass index (BMI) of 50.0 to 59.9 in adult ([place])   4. Pure hypercholesterolemia   5. Essential hypertension   6. Chronic diastolic heart failure ([place])   7. Encounter for education about heart failure   8.  OSA (obstructive sleep apnea)   9. Obesity hypoventilation syndrome ([place])   10. Medication management   11. Acute on chronic respiratory failure with hypoxemia ([facility])   12. Type 2 diabetes mellitus with stage 3 chronic kidney disease, with long-term current use of insulin ([place])    PLAN:    1. Chest pain, shortness of breath: this may be related to obesity hypoventilation syndrome, but she has never had an evaluation for ischemia. She does have risk factors, including diabetes, obesity, hypertension, and hypercholesterolemia. Discussed  multiple options for testing. With her chronic kidney disease (stage 3, with GFR 53) would like to minimize contrast dye load, so CT coronary angiography not ideal. She cannot exercise. We did discuss lexiscan--no significant AV block, hypotension, or bradycardia, but does have history of lung disease. However, no wheezing or recent issues with her breathing, so she is amenable to regadenoson. Will try to get at [facility] given her body habitus. She is aware it will be a 2 day test. Not on beta blocker.  -lexiscan myoview for ischemia  -we did discuss that if her test is positive, a cardiac catheterization would be the next step. Risks and benefits of cardiac catheterization have been discussed with the patient.  These include bleeding, infection, kidney damage, stroke, heart attack, death.  The patient understands these risks and is willing to proceed if indicated.  2. Class 3 obesity, BMI 58.8: her obesity is causing life threatening complications, including chronic respiratory failure requiring oxygen likely due to obesity hypoventilation syndrome. Her weight is also significantly contributing to her chronic diastolic heart failure, and with a recent hospitalization for acute exacerbation, minimizing contributing factors to her heart failure with preserved ejection fraction is key. I fully support her efforts to pursue gastric bypass surgery given her comorbid  conditions.   3. Hypercholesterolemia: prior LDL 156, she believes this was on atorvastatin 40 mg. If her LDL is still significantly elevated, will need to increase intensity of statin, add ezetimibe, and if CAD noted on her workup, consider PCKS9.  4. Hypertension: well controlled today. Continue   5. Chronic diastolic heart failure: recent admission for this. Notes that her weights have been stable at home, no increase in symptoms. On SGLT2 inhibitor, with benefit for both heart failure and her type II diabetes. Discussed risk factors, management of this at length today.   -counseled on daily weights, fluid restriction, salt guidelines, when to seek immediate medical attention  6. OSA on CPAP: being managed by PCP  7. Prevention and cardiac disease counseling, including heart failure education:   The 10-year ASCVD risk score Mikey Bussing DC Jr., et al., 2013) is: 24.7%   Values used to calculate the score:     Age: 83 years     Sex: Female     Is Non-Hispanic African American: Yes     Diabetic: Yes     Tobacco smoker: Yes     Systolic Blood Pressure: 195 mmHg     Is BP treated: Yes     HDL Cholesterol: 56 mg/dL     Total Cholesterol: 246 mg/dL  Plan for follow up: 3 mos  Medication Adjustments/Labs and Tests Ordered: Current medicines are reviewed at length with the patient today.  Concerns regarding medicines are outlined above.  Orders Placed This Encounter  Procedures  . Lipid panel  . MYOCARDIAL PERFUSION IMAGING  . EKG 12-Lead   No orders of the defined types were placed in this encounter.   Patient Instructions  Medication Instructions: Your physician recommends that you continue on your current medications as directed.    If you need a refill on your cardiac medications before your next appointment, please call your pharmacy.   Labwork: Your physician recommends that you return for lab work in today (Lipid)   Procedures/Testing: Your physician has requested that you  have a lexiscan myoview. For further information please visit [email or web address]. Please follow instruction sheet, as given. [place] [address]  Follow-Up: Your physician wants you to follow-up in 3  months with Dr. Harrell Gave.  Special Instructions:    Thank you for choosing [facility] at [facility]!!       Signed, [name], MD PhD 10/27/2017 10:53 AM    [place]

## 2017-10-27 NOTE — Patient Instructions (Signed)
Medication Instructions: Your physician recommends that you continue on your current medications as directed.    If you need a refill on your cardiac medications before your next appointment, please call your pharmacy.   Labwork: Your physician recommends that you return for lab work in today (Lipid)   Procedures/Testing: Your physician has requested that you have a lexiscan myoview. For further information please visit [email or web address]. Please follow instruction sheet, as given. [place] [address]  Follow-Up: Your physician wants you to follow-up in 3 months with Dr. Harrell Gave.  Special Instructions:    Thank you for choosing Heartcare at [facility]!!

## 2017-10-28 ENCOUNTER — Other Ambulatory Visit (INDEPENDENT_AMBULATORY_CARE_PROVIDER_SITE_OTHER): Payer: Self-pay | Admitting: Specialist

## 2017-10-28 ENCOUNTER — Telehealth (INDEPENDENT_AMBULATORY_CARE_PROVIDER_SITE_OTHER): Payer: Self-pay | Admitting: Specialist

## 2017-10-28 ENCOUNTER — Other Ambulatory Visit: Payer: Self-pay | Admitting: Student in an Organized Health Care Education/Training Program

## 2017-10-28 ENCOUNTER — Other Ambulatory Visit: Payer: Self-pay

## 2017-10-28 MED ORDER — HYDROCODONE-ACETAMINOPHEN 7.5-325 MG PO TABS: 1.0000 | ORAL_TABLET | Freq: Four times a day (QID) | ORAL | 0 refills | Status: DC | PRN

## 2017-10-28 NOTE — Telephone Encounter (Signed)
Patient called to check the status of the refill. She states that yesterday she tried to tell the person she spoke with that she will be in the office tomorrow 10/2 with  Dr Ernestina Patches and wanted to pick it up then but was told to tell Alyse Low once she called back.

## 2017-10-28 NOTE — Telephone Encounter (Signed)
I called and advised patient that her rx is ready for pick up

## 2017-10-28 NOTE — Telephone Encounter (Signed)
Dr. Louanne Skye has refill request in his back for review

## 2017-10-28 NOTE — Telephone Encounter (Signed)
Baclofen refill request 

## 2017-10-29 ENCOUNTER — Encounter (INDEPENDENT_AMBULATORY_CARE_PROVIDER_SITE_OTHER): Payer: Self-pay | Admitting: Physical Medicine and Rehabilitation

## 2017-10-29 ENCOUNTER — Other Ambulatory Visit: Payer: Self-pay | Admitting: Student in an Organized Health Care Education/Training Program

## 2017-10-29 ENCOUNTER — Other Ambulatory Visit: Payer: Self-pay | Admitting: Family Medicine

## 2017-10-29 ENCOUNTER — Ambulatory Visit (INDEPENDENT_AMBULATORY_CARE_PROVIDER_SITE_OTHER): Payer: Medicare Other | Admitting: Physical Medicine and Rehabilitation

## 2017-10-29 DIAGNOSIS — R202 Paresthesia of skin: Secondary | ICD-10-CM

## 2017-10-29 DIAGNOSIS — Z1231 Encounter for screening mammogram for malignant neoplasm of breast: Secondary | ICD-10-CM

## 2017-10-29 MED ORDER — MUPIROCIN 2 % EX OINT: 1.0000 "application " | TOPICAL_OINTMENT | Freq: Two times a day (BID) | CUTANEOUS | 0 refills | Status: DC

## 2017-10-29 NOTE — Progress Notes (Signed)
 .  Numeric Pain Rating Scale and Functional Assessment Average Pain 10   In the last MONTH (on 0-10 scale) has pain interfered with the following?  1. General activity like being  able to carry out your everyday physical activities such as walking, climbing stairs, carrying groceries, or moving a chair?  Rating(7)

## 2017-10-31 ENCOUNTER — Ambulatory Visit (INDEPENDENT_AMBULATORY_CARE_PROVIDER_SITE_OTHER): Payer: Medicare Other | Admitting: Specialist

## 2017-11-02 ENCOUNTER — Telehealth: Payer: Self-pay | Admitting: Family Medicine

## 2017-11-02 NOTE — Telephone Encounter (Signed)
After-hours emergency line phone note  Patient calls for advice on right foot injury she sustained on Friday.  Patient states that she fell and hit right foot on door frame.  She states that it was initially really painful and was bleeding.  In interim bleeding has resolved and is no longer painful.  She states that she has been able to walk on her foot with no problems.  Upon chart review patient had similar fall and foot pathology in November 2018.  She was found to have displaced fractures of distal second third and fourth metatarsals.  At that visit was especially painful.  Described treatment options for patient with caveat that since I have not seen her, I can only go by what she is telling me.  If patient continues to be extremely concerned informed her that she could go to the emergency department to be evaluated.  Given that she is no longer bleeding, it is not painful, and has been able to walk on foot gave patient option of being seen less urgently (10/7) given the long wait times in ED.  Patient can call first thing in the morning to try to be seen as a same-day clinic visit.  Did let patient know if clinic is full, will probably have to be seen at urgent care.  Given that this is been ongoing for 3 days and has seemed to improve and not get worse since inciting event recommended the patient be seen less urgently in clinic or urgent care.  Patient voiced understanding and is agreement to call clinic first thing in the morning to schedule same-day appointment.  Guadalupe Dawn MD PGY-2 Family Medicine Resident

## 2017-11-03 ENCOUNTER — Ambulatory Visit
Admission: RE | Admit: 2017-11-03 | Discharge: 2017-11-03 | Disposition: A | Discharge status: Home / Self Care | Payer: Medicare Other | Source: Ambulatory Visit | Attending: Family Medicine | Admitting: Family Medicine

## 2017-11-03 ENCOUNTER — Ambulatory Visit (INDEPENDENT_AMBULATORY_CARE_PROVIDER_SITE_OTHER): Payer: Medicare Other | Admitting: Family Medicine

## 2017-11-03 ENCOUNTER — Other Ambulatory Visit: Payer: Self-pay | Admitting: Family Medicine

## 2017-11-03 ENCOUNTER — Other Ambulatory Visit: Payer: Self-pay

## 2017-11-03 ENCOUNTER — Ambulatory Visit (HOSPITAL_COMMUNITY): Payer: Medicare Other | Attending: Family Medicine

## 2017-11-03 ENCOUNTER — Other Ambulatory Visit: Payer: Self-pay | Admitting: Student in an Organized Health Care Education/Training Program

## 2017-11-03 ENCOUNTER — Telehealth (INDEPENDENT_AMBULATORY_CARE_PROVIDER_SITE_OTHER): Payer: Self-pay | Admitting: Specialist

## 2017-11-03 VITALS — BP 115/72 | HR 46 | Temp 97.8°F | Wt >= 6400 oz

## 2017-11-03 DIAGNOSIS — M7989 Other specified soft tissue disorders: Secondary | ICD-10-CM | POA: Diagnosis not present

## 2017-11-03 DIAGNOSIS — S91302A Unspecified open wound, left foot, initial encounter: Secondary | ICD-10-CM | POA: Diagnosis not present

## 2017-11-03 DIAGNOSIS — M79672 Pain in left foot: Secondary | ICD-10-CM

## 2017-11-03 DIAGNOSIS — R9431 Abnormal electrocardiogram [ECG] [EKG]: Secondary | ICD-10-CM | POA: Insufficient documentation

## 2017-11-03 DIAGNOSIS — S99922A Unspecified injury of left foot, initial encounter: Secondary | ICD-10-CM | POA: Diagnosis not present

## 2017-11-03 DIAGNOSIS — R55 Syncope and collapse: Secondary | ICD-10-CM

## 2017-11-03 DIAGNOSIS — J302 Other seasonal allergic rhinitis: Secondary | ICD-10-CM

## 2017-11-03 DIAGNOSIS — J9621 Acute and chronic respiratory failure with hypoxia: Secondary | ICD-10-CM | POA: Diagnosis not present

## 2017-11-03 NOTE — Progress Notes (Signed)
Please let patient know her foot xray was normal, no fracture. Thank you.

## 2017-11-03 NOTE — Telephone Encounter (Signed)
Patient called left VM she didn't state what she needed wanted to speak with [facility]

## 2017-11-03 NOTE — Progress Notes (Signed)
[name] - 52 y.o. female MRN [id number]  Date of birth: Jan 18, 1966  Office Visit Note: Visit Date: 10/29/2017 PCP: [name], MD Referred by: [name], MD  Subjective: Chief Complaint  Patient presents with  . Neck - Pain  . Right Arm - Pain  . Right Hand - Numbness, Pain  . Right Upper Arm - Edema   HPI: [name] is a 52 y.o. female who comes in today at the request of [name] for electrodiagnostic study of the right upper limb.  She is right-hand dominant with history of more left-sided neck pain but right sided referral pain in the arm and right hand particularly into the ring finger.  She reports that she feels swelling in the right upper arm and numbness in the hand.  Numbness is somewhat nondermatomal.  She reports chronic worsening severe pain is been ongoing for some time.  She reports constant symptoms and cannot tell me really anything that makes it worse or better.  She rates her pain as 10 out of 10.  ROS Otherwise per HPI.  Assessment & Plan: Visit Diagnoses:  1. Paresthesia of skin     Plan: Impression: The above electrodiagnostic study is ABNORMAL and reveals evidence of a mild chronic C7 and C8 radiculopathy on the right.    There is also evidence  of a borderline right median nerve entrapment at the wrist (carpal tunnel syndrome) affecting sensory components.   There is no significant electrodiagnostic evidence of any other focal nerve entrapment or brachial plexopathy.   Recommendations: 1.  Follow-up with referring physician. 2.  Continue current management of symptoms.  Meds & Orders: No orders of the defined types were placed in this encounter.   Orders Placed This Encounter  Procedures  . NCV with EMG (electromyography)    Follow-up: Return for [name], M.D..   Procedures: No procedures performed  EMG & NCV Findings: Evaluation of the right median (across palm) sensory nerve showed prolonged distal peak latency  (Wrist, 3.7 ms) and prolonged distal peak latency (Palm, 2.6 ms).  All remaining nerves (as indicated in the following tables) were within normal limits.    Needle evaluation of the right first dorsal interosseous muscle showed increased motor unit amplitude and diminished recruitment.  The right extensor digitorum communis muscle showed increased insertional activity and slightly increased spontaneous activity.  All remaining muscles (as indicated in the following table) showed no evidence of electrical instability.    Impression: The above electrodiagnostic study is ABNORMAL and reveals evidence of a mild chronic C7 and C8 radiculopathy on the right.    There is also evidence  of a borderline right median nerve entrapment at the wrist (carpal tunnel syndrome) affecting sensory components.   There is no significant electrodiagnostic evidence of any other focal nerve entrapment or brachial plexopathy.   Recommendations: 1.  Follow-up with referring physician. 2.  Continue current management of symptoms.  ___________________________ Laurence Spates FAAPMR Board Certified, American Board of Physical Medicine and Rehabilitation    Nerve Conduction Studies Anti Sensory Summary Table   Stim Site NR Peak (ms) Norm Peak (ms) P-T Amp (V) Norm P-T Amp Site1 Site2 Delta-P (ms) Dist (cm) Vel (m/s) Norm Vel (m/s)  Right Median Acr Palm Anti Sensory (2nd Digit)  31.4C  Wrist    *3.7 <3.6 19.2 >10 Wrist Palm 1.1 0.0    Palm    *2.6 <2.0 0.7         Right Radial Anti  Sensory (Base 1st Digit)  31C  Wrist    2.0 <3.1 37.7  Wrist Base 1st Digit 2.0 0.0    Right Ulnar Anti Sensory (5th Digit)  31.5C  Wrist    3.3 <3.7 24.9 >15.0 Wrist 5th Digit 3.3 14.0 42 >38   Motor Summary Table   Stim Site NR Onset (ms) Norm Onset (ms) O-P Amp (mV) Norm O-P Amp Site1 Site2 Delta-0 (ms) Dist (cm) Vel (m/s) Norm Vel (m/s)  Right Median Motor (Abd Poll Brev)  31.5C  Wrist    3.4 <4.2 8.9 >5 Elbow Wrist 4.2 23.0  55 >50  Elbow    7.6  9.2         Right Ulnar Motor (Abd Dig Min)  32C  Wrist    2.6 <4.2 8.1 >3 B Elbow Wrist 3.7 20.0 54 >53  B Elbow    6.3  7.7  A Elbow B Elbow 1.1 9.0 82 >53  A Elbow    7.4  7.7          EMG   Side Muscle Nerve Root Ins Act Fibs Psw Amp Dur Poly Recrt Int Fraser Din Comment  Right 1stDorInt Ulnar C8-T1 Nml Nml Nml *Incr Nml 0 *Reduced Nml   Right Abd Poll Brev Median C8-T1 Nml Nml Nml Nml Nml 0 Nml Nml   Right ExtDigCom   *CRD *1+ *1+ Nml Nml 0 Nml Nml   Right Triceps Radial C6-7-8 Nml Nml Nml Nml Nml 0 Nml Nml   Right Deltoid Axillary C5-6 Nml Nml Nml Nml Nml 0 Nml Nml     Nerve Conduction Studies Anti Sensory Left/Right Comparison   Stim Site L Lat (ms) R Lat (ms) L-R Lat (ms) L Amp (V) R Amp (V) L-R Amp (%) Site1 Site2 L Vel (m/s) R Vel (m/s) L-R Vel (m/s)  Median Acr Palm Anti Sensory (2nd Digit)  31.4C  Wrist  *3.7   19.2  Wrist Palm     Palm  *2.6   0.7        Radial Anti Sensory (Base 1st Digit)  31C  Wrist  2.0   37.7  Wrist Base 1st Digit     Ulnar Anti Sensory (5th Digit)  31.5C  Wrist  3.3   24.9  Wrist 5th Digit  42    Motor Left/Right Comparison   Stim Site L Lat (ms) R Lat (ms) L-R Lat (ms) L Amp (mV) R Amp (mV) L-R Amp (%) Site1 Site2 L Vel (m/s) R Vel (m/s) L-R Vel (m/s)  Median Motor (Abd Poll Brev)  31.5C  Wrist  3.4   8.9  Elbow Wrist  55   Elbow  7.6   9.2        Ulnar Motor (Abd Dig Min)  32C  Wrist  2.6   8.1  B Elbow Wrist  54   B Elbow  6.3   7.7  A Elbow B Elbow  82   A Elbow  7.4   7.7           Waveforms:            Clinical History: MRI LUMBAR SPINE WITHOUT AND WITH CONTRAST  TECHNIQUE: Multiplanar and multiecho pulse sequences of the lumbar spine were obtained without and with intravenous contrast.  CONTRAST:  89mL MULTIHANCE GADOBENATE DIMEGLUMINE 529 MG/ML IV SOLN  COMPARISON:  Prior radiographs from 10/07/16.  FINDINGS: Segmentation: Transitional lumbosacral anatomy. Lowest well-formed disc labeled  the L5-S1 level.  Alignment: Trace retrolisthesis of  L1 on L2. Vertebral bodies otherwise normally aligned with preservation of the normal lumbar lordosis. No listhesis or subluxation.  Vertebrae: Patient is status post posterior spinal fusion extending from L3 through S1. Interbody fusion devices in place at L3-4, L4-5, and L5-S1. Susceptibility artifact mildly limits evaluation at these levels.  Vertebral body heights maintained. No evidence for acute or chronic fracture. Bone marrow signal intensity within normal limits. No discrete or worrisome osseous lesions.  Conus medullaris: Extends to the L2-3 level and appears normal.  Paraspinal and other soft tissues: Remote postsurgical changes present within the posterior paraspinous musculature. Paraspinous soft tissues demonstrate no acute abnormality. Visualized kidneys are mildly atrophic in appearance. Partially visualized visceral structures otherwise unremarkable.  Disc levels:  T11-12: Seen only on sagittal projection. Diffuse disc bulge with disc desiccation and intervertebral disc space narrowing. Posterior element hypertrophy. Mild spinal stenosis. No significant foraminal encroachment.  T12-L1: Diffuse degenerative disc bulge with disc desiccation and intervertebral disc space narrowing. Associated reactive elbow marginal endplate osteophytosis. Mild bilateral facet arthrosis. Resultant mild to moderate canal stenosis with mild flattening of the distal spinal cord. Moderate left foraminal narrowing related to disc bulge and facet disease (series 3, image 12). Multilead mild right neural foraminal narrowing.  L1-2: Trace retrolisthesis. Mild annular disc bulge, slightly eccentric to the right. Moderate facet arthrosis. No significant canal or lateral recess stenosis. Mild bilateral foraminal narrowing.  L2-3: Mild annular disc bulge. Fairly advanced bilateral facet arthrosis. Changes superimposed on  short pedicles result in moderate canal stenosis. Mild bilateral foraminal narrowing without neural impingement.  L3-4: Sequelae of prior posterior and interbody fusion with posterior decompression. No residual canal stenosis. Foramina appear patent.  L4-5: Sequelae of prior posterior and interbody fusion with posterior decompression. Enhancing postoperative granulation tissue at the posterior laminectomy site. No residual canal stenosis. Foramina are grossly patent.  L5-S1: Sequelae of prior posterior and interbody fusion. Enhancing postoperative granulation tissue seen posteriorly and extending into the left lateral and ventral epidural space, surrounding the descending S1 nerve root. No residual canal stenosis. Foramina appear grossly patent.  IMPRESSION: 1. Postoperative changes from prior PLIF at L3 through S1 without residual or recurrent stenosis. 2. Advanced bilateral facet arthrosis at L2-3 with resultant moderate canal stenosis. 3. Degenerative disc bulging at T11-12 and T12-L1 with resultant mild to moderate canal stenosis as above. 4. Multifactorial degenerative changes at T12-L1 with resultant moderate left foraminal stenosis.   Electronically Signed   By: Jeannine Boga M.D.   On: 10/26/2016 13:15   She reports that she quit smoking about 8 weeks ago. Her smoking use included cigarettes. She has a 39.00 pack-year smoking history. She has never used smokeless tobacco.  Recent Labs    03/20/17 1638 06/16/17 0937 09/09/17 0852  HGBA1C 7.3 7.0 7.4*    Objective:  VS:  HT:    WT:   BMI:     BP:   HR: bpm  TEMP: ( )  RESP:  Physical Exam  Constitutional: She is oriented to person, place, and time.  Musculoskeletal:  Inspection reveals no atrophy of the bilateral APB or FDI or hand intrinsics. There is no swelling, color changes, allodynia or dystrophic changes. There is 5 out of 5 strength in the bilateral wrist extension, finger abduction and  long finger flexion. There is intact sensation to light touch in all dermatomal and peripheral nerve distributions. here is a negative Hoffmann's test bilaterally.  Neurological: She is alert and oriented to person, place, and time. She exhibits normal  muscle tone. Coordination normal.    Ortho Exam Imaging: No results found.  Past Medical/Family/Surgical/Social History: Medications & Allergies reviewed per EMR, new medications updated. Patient Active Problem List   Diagnosis Date Noted  . Essential hypertension 10/27/2017  . Solitary pulmonary nodule 10/07/2017  . Acute on chronic congestive heart failure ([place])   . Acute on chronic right-sided congestive heart failure ([place])   . Acute on chronic respiratory failure with hypoxemia ([place])   . Leg edema, right 10/01/2017  . Restrictive lung disease secondary to obesity 10/01/2017  . Polycythemia, secondary 10/01/2017  . Oxygen desaturation 09/30/2017  . Dyspnea 09/30/2017  . Hepatitis B surface antigen positive 07/08/2017  . COPD (chronic obstructive pulmonary disease) with chronic bronchitis ([place]) 06/27/2017  . Chronic kidney disease (CKD), stage IV (severe) ([place]) 01/14/2017  . Type 2 diabetes mellitus with stage 3 chronic kidney disease, with long-term current use of insulin ([place]) 12/12/2016  . Urge incontinence of urine 12/05/2016  . Class 3 severe obesity due to excess calories with serious comorbidity and body mass index (BMI) of 50.0 to 59.9 in adult ([place]) 10/02/2012  . Chronic diastolic heart failure ([place]) 04/07/2012  . GERD 01/26/2010  . Hypertension 08/04/2008  . OSA (obstructive sleep apnea) 02/03/2008  . FIBROCYSTIC BREAST DISEASE 10/28/2006  . Hyperlipidemia 03/27/2006  . Obesity hypoventilation syndrome ([place]) 03/27/2006  . TOBACCO DEPENDENCE 03/27/2006  . Depression with anxiety 03/27/2006   Past Medical History:  Diagnosis Date  . Arthritis 04-10-11   hips, shoulders, back  . Asthma   . Bipolar disorder ([place])   .  Cancer ([place]) 1993   cervical, no treatment done, went away per pt  . Cervical dysplasia or atypia 04-10-11   '93- once dx.-got pregnant-no intervention, then postpartum, no dysplasia found  . CHF (congestive heart failure) ([place])    no cardiologist 2014 dx, none now  . 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 Pathology showed Condyloma, gluteal CONDYLOMA ACUMINATUM.   [name] Kitchen Dyspnea    with actity - better since taking Singular  . Fall 12/16/2016  . Fibrocystic breast disease   . Hypercalcemia   . Hyperlipidemia   . Hypertension   . Migraine headache    none recent  . Morbid obesity ([place]) 03/27/2006  . Neuropathy   . Skin lesion 03/15/2011   In gluteal crease now s/p removal by Dr. [name] of General Surgery on 3/12. Path shows condyloma.     . Sleep apnea 04-10-11   uses cpap, pt does not know settings  . Trigger finger    left third   Family History  Problem Relation Age of Onset  . Stroke Father   . Cancer Maternal Aunt        breast  . Asthma Other   . COPD Other   . Hypertension Other   . Diabetes Other    Past Surgical History:  Procedure Laterality Date  . ANAL FISTULECTOMY  04/17/2011   Procedure: FISTULECTOMY ANAL;  Surgeon: Imogene Burn. [name], MD;  Location: [facility];  Service: General;  Laterality: N/A;  Excision of Condyloma Gluteal Cleft   . BACK SURGERY  04-10-11   x5-Lumbar fusion-retained hardware.(Dr. Louanne Skye)  . BREAST CYST EXCISION     bilateral breast, 3 cysts removed from each breast  . BREAST EXCISIONAL BIOPSY Right    x 3  . BREAST EXCISIONAL BIOPSY Left    x 3  . CARPAL TUNNEL RELEASE Bilateral   . Cervical biospy    .  COLONOSCOPY WITH PROPOFOL N/A 07/06/2015   Procedure: COLONOSCOPY WITH PROPOFOL;  Surgeon: [name], MD;  Location: [facility];  Service: Endoscopy;  Laterality: N/A;  . DOPPLER ECHOCARDIOGRAPHY  04/06/2012   AT [place] 55-60%  . I&D EXTREMITY Left 12/18/2016   Procedure: IRRIGATION AND DEBRIDEMENT LEFT  FOOT, CLOSURE;  Surgeon: [name], MD;  Location: [facility];  Service: Orthopedics;  Laterality: Left;  [name] Kitchen MULTIPLE TOOTH EXTRACTIONS    . NECK SURGERY  04-10-11   x3- cervical fusion with plating and screws-Dr. [name]  . TEE WITHOUT CARDIOVERSION N/A 04/06/2012   Procedure: TRANSESOPHAGEAL ECHOCARDIOGRAM (TEE);  Surgeon: [name], MD;  Location: [facility] ENDOSCOPY;  Service: Cardiovascular;  Laterality: N/A;  . TRIGGER FINGER RELEASE Right    middle finger  . TRIGGER FINGER RELEASE Left 06/28/2016   Procedure: RELEASE TRIGGER FINGER LEFT 3RD FINGER;  Surgeon: [name], MD;  Location: [facility];  Service: Orthopedics;  Laterality: Left;  . TRIGGER FINGER RELEASE Right 06/12/2016   Procedure: RIGHT INDEX FINGER TRIGGER RELEASE;  Surgeon: [name], MD;  Location: [place] [facility];  Service: Orthopedics;  Laterality: Right;   Social History   Occupational History  . Not on file  Tobacco Use  . Smoking status: Former Smoker    Packs/day: 1.00    Years: 39.00    Pack years: 39.00    Types: Cigarettes    Last attempt to quit: 09/07/2017    Years since quitting: 0.1  . Smokeless tobacco: Never Used  . Tobacco comment: Quit prior to hospitalization using low dose Chantix  Substance and Sexual Activity  . Alcohol use: No    Alcohol/week: 0.0 standard drinks  . Drug use: Not Currently    Types: Marijuana, "Crack" cocaine    Comment: hx marijuana usemany years ago, Crack -none since 11/2015  . Sexual activity: Yes    Partners: Male

## 2017-11-03 NOTE — Patient Instructions (Addendum)
  Please get your foot x-rayed today.  Home health will call you to set up a nurse to come to your house. Please change dressing daily and use bactroban ointment on your toes.  Please follow up with Dr. Louanne Skye Thursday and let him evaluate your foot as well.  If you have chest pain, shortness of breath, or pass out again please go directly to the ED.  If you have questions or concerns please do not hesitate to call at [phone].  Lucila Maine, DO PGY-3, [facility] 11/03/2017 10:52 AM

## 2017-11-03 NOTE — Progress Notes (Signed)
Subjective:    Patient ID: [name], female    DOB: October 22, 1965, 52 y.o.   MRN: [id number]   CC: fell and cut toes  HPI:  [place] Thursday night on her way to the bathroom. She woke up "hours later" Friday morning. Mechanically, she fell because her R knee gave out, she fell onto her right side then "blacked out for hours". She cannot exactly state why she lost consciousness. She denies chest pain or shortness of breath. She did not notice any focal neuro changes. She did not lose bladder or bowel continence. When she woke up she saw blood on her left foot. She cleaned it and noticed cuts on the bottom of her toes. She "felt confused and sleepy all day Friday" and slept on and off. She could not locate her phone to call for help. She woke up Saturday morning and felt totally normal. She is coming in today to make sure her foot is ok. She has DM. 3rd toe left foot tender. She had also called ortho to see them for this but could not get in until Thursday. She states today she feels totally fine. Denies SOB, CP, confusion, fatigue, lethargy, focal weakness, sensation changes, seizure activity, fevers, chills, spreading redness or rash on her LE, LE swelling.  Smoking status reviewed- "former" smoker- quit September 07 2017  Review of Systems- see HPI   Objective:  BP 115/72   Pulse (!) 46   Temp 97.8 F (36.6 C) (Oral)   Wt (!) 401 lb (181.9 kg)   LMP 04/10/2006   SpO2 91% Comment: 2L O2  BMI 57.54 kg/m  Vitals and nursing note reviewed  General: morbidly obese in NAD, [place] in place HEENT: normocephalic, MMM Cardiac: bradycardic, regular rhythm, clear S1 and S2, no murmurs, rubs, or gallops Respiratory: clear to auscultation bilaterally, no increased work of breathing Abdomen: obese abdomen Extremities: no edema or cyanosis. Warm, well perfused. No erythema.  Feet: plantar aspect of left foot with open horizontal ulcerations at base of 3rd and 4th toes, tender to palpation and ROM of  toes.  Neuro: alert and oriented, no focal deficits   Assessment & Plan:    Syncope  Concerning history given by patient of syncopal event/LOC 3 days ago, patient asymptomatic and cannot express exactly what happened to cause her to pass out, the fall did not cause her LOC and she did not hit her head. She is bradycardic in clinic today. Her EKG has normal rate but frequent PACs. She is scheduled to get myocardial perfusion imaging next week. She has no chest pain or SOB. Vitals are stable and she is well appearing. Strict return precautions reviewed with patient. Follow up with cardiology as scheduled.  Left foot pain  Will x-ray left foot to ensure patient did not break anything in her fall.she is point tender over 3rd toe on left foot. She has follow up with orthopedics on Thursday, will discuss with Dr. Louanne Skye if the x-ray demonstrates a fracture.  Open wound of left foot  Plantar aspect 3rd and 4th toe of left foot with deep cracks in base of toe. No signs of infection on exam of these wounds, there is evidence of fungal infection of feet bilaterally that patient ahs been using antifungal cream for. Wounds cleaned, applied bactroban ointment and dressed them. Ordered HH nursing for patient to help with wound care. Follow up w/ PCP as scheduled 10/16. Reasons to go to ED reviewed extensively with patient.  Return in about 2 weeks (around 11/17/2017).   Lucila Maine, DO Family Medicine Resident PGY-3

## 2017-11-03 NOTE — Telephone Encounter (Signed)
I called her backbut she had already been taken care of, it about needing an appt, but this has been made for 11/06/17 with Dr. Louanne Skye

## 2017-11-03 NOTE — Procedures (Signed)
EMG & NCV Findings: Evaluation of the right median (across palm) sensory nerve showed prolonged distal peak latency (Wrist, 3.7 ms) and prolonged distal peak latency (Palm, 2.6 ms).  All remaining nerves (as indicated in the following tables) were within normal limits.    Needle evaluation of the right first dorsal interosseous muscle showed increased motor unit amplitude and diminished recruitment.  The right extensor digitorum communis muscle showed increased insertional activity and slightly increased spontaneous activity.  All remaining muscles (as indicated in the following table) showed no evidence of electrical instability.    Impression: The above electrodiagnostic study is ABNORMAL and reveals evidence of a mild chronic C7 and C8 radiculopathy on the right.    There is also evidence  of a borderline right median nerve entrapment at the wrist (carpal tunnel syndrome) affecting sensory components.   There is no significant electrodiagnostic evidence of any other focal nerve entrapment or brachial plexopathy.   Recommendations: 1.  Follow-up with referring physician. 2.  Continue current management of symptoms.  ___________________________ Laurence Spates FAAPMR Board Certified, American Board of Physical Medicine and Rehabilitation    Nerve Conduction Studies Anti Sensory Summary Table   Stim Site NR Peak (ms) Norm Peak (ms) P-T Amp (V) Norm P-T Amp Site1 Site2 Delta-P (ms) Dist (cm) Vel (m/s) Norm Vel (m/s)  Right Median Acr Palm Anti Sensory (2nd Digit)  31.4C  Wrist    *3.7 <3.6 19.2 >10 Wrist Palm 1.1 0.0    Palm    *2.6 <2.0 0.7         Right Radial Anti Sensory (Base 1st Digit)  31C  Wrist    2.0 <3.1 37.7  Wrist Base 1st Digit 2.0 0.0    Right Ulnar Anti Sensory (5th Digit)  31.5C  Wrist    3.3 <3.7 24.9 >15.0 Wrist 5th Digit 3.3 14.0 42 >38   Motor Summary Table   Stim Site NR Onset (ms) Norm Onset (ms) O-P Amp (mV) Norm O-P Amp Site1 Site2 Delta-0 (ms) Dist (cm)  Vel (m/s) Norm Vel (m/s)  Right Median Motor (Abd Poll Brev)  31.5C  Wrist    3.4 <4.2 8.9 >5 Elbow Wrist 4.2 23.0 55 >50  Elbow    7.6  9.2         Right Ulnar Motor (Abd Dig Min)  32C  Wrist    2.6 <4.2 8.1 >3 B Elbow Wrist 3.7 20.0 54 >53  B Elbow    6.3  7.7  A Elbow B Elbow 1.1 9.0 82 >53  A Elbow    7.4  7.7          EMG   Side Muscle Nerve Root Ins Act Fibs Psw Amp Dur Poly Recrt Int Fraser Din Comment  Right 1stDorInt Ulnar C8-T1 Nml Nml Nml *Incr Nml 0 *Reduced Nml   Right Abd Poll Brev Median C8-T1 Nml Nml Nml Nml Nml 0 Nml Nml   Right ExtDigCom   *CRD *1+ *1+ Nml Nml 0 Nml Nml   Right Triceps Radial C6-7-8 Nml Nml Nml Nml Nml 0 Nml Nml   Right Deltoid Axillary C5-6 Nml Nml Nml Nml Nml 0 Nml Nml     Nerve Conduction Studies Anti Sensory Left/Right Comparison   Stim Site L Lat (ms) R Lat (ms) L-R Lat (ms) L Amp (V) R Amp (V) L-R Amp (%) Site1 Site2 L Vel (m/s) R Vel (m/s) L-R Vel (m/s)  Median Acr Palm Anti Sensory (2nd Digit)  31.4C  Wrist  *3.7  19.2  Wrist Palm     Palm  *2.6   0.7        Radial Anti Sensory (Base 1st Digit)  31C  Wrist  2.0   37.7  Wrist Base 1st Digit     Ulnar Anti Sensory (5th Digit)  31.5C  Wrist  3.3   24.9  Wrist 5th Digit  42    Motor Left/Right Comparison   Stim Site L Lat (ms) R Lat (ms) L-R Lat (ms) L Amp (mV) R Amp (mV) L-R Amp (%) Site1 Site2 L Vel (m/s) R Vel (m/s) L-R Vel (m/s)  Median Motor (Abd Poll Brev)  31.5C  Wrist  3.4   8.9  Elbow Wrist  55   Elbow  7.6   9.2        Ulnar Motor (Abd Dig Min)  32C  Wrist  2.6   8.1  B Elbow Wrist  54   B Elbow  6.3   7.7  A Elbow B Elbow  82   A Elbow  7.4   7.7           Waveforms:

## 2017-11-04 ENCOUNTER — Encounter: Payer: Self-pay | Admitting: Family Medicine

## 2017-11-04 DIAGNOSIS — R55 Syncope and collapse: Secondary | ICD-10-CM | POA: Insufficient documentation

## 2017-11-04 DIAGNOSIS — M79672 Pain in left foot: Secondary | ICD-10-CM | POA: Insufficient documentation

## 2017-11-04 DIAGNOSIS — S91302A Unspecified open wound, left foot, initial encounter: Secondary | ICD-10-CM | POA: Insufficient documentation

## 2017-11-04 NOTE — Assessment & Plan Note (Signed)
  Will x-ray left foot to ensure patient did not break anything in her fall.she is point tender over 3rd toe on left foot. She has follow up with orthopedics on Thursday, will discuss with Dr. Louanne Skye if the x-ray demonstrates a fracture.

## 2017-11-04 NOTE — Assessment & Plan Note (Addendum)
  Plantar aspect 3rd and 4th toe of left foot with deep cracks in base of toe. No signs of infection on exam of these wounds, there is evidence of fungal infection of feet bilaterally that patient ahs been using antifungal cream for. Wounds cleaned, applied bactroban ointment and dressed them. Ordered HH nursing for patient to help with wound care. Follow up w/ PCP as scheduled 10/16. Reasons to go to ED reviewed extensively with patient.

## 2017-11-04 NOTE — Assessment & Plan Note (Signed)
  Concerning history given by patient of syncopal event/LOC 3 days ago, patient asymptomatic and cannot express exactly what happened to cause her to pass out, the fall did not cause her LOC and she did not hit her head. She is bradycardic in clinic today. Her EKG has normal rate but frequent PACs. She is scheduled to get myocardial perfusion imaging next week. She has no chest pain or SOB. Vitals are stable and she is well appearing. Strict return precautions reviewed with patient. Follow up with cardiology as scheduled.

## 2017-11-05 ENCOUNTER — Other Ambulatory Visit: Payer: Self-pay | Admitting: Student in an Organized Health Care Education/Training Program

## 2017-11-05 ENCOUNTER — Telehealth (HOSPITAL_COMMUNITY): Payer: Self-pay | Admitting: *Deleted

## 2017-11-05 NOTE — Telephone Encounter (Signed)
Patient given detailed instructions per Myocardial Perfusion Study Information Sheet for the test on 11/10/17. Patient notified to arrive 15 minutes early and that it is imperative to arrive on time for appointment to keep from having the test rescheduled.  If you need to cancel or reschedule your appointment, please call the office within 24 hours of your appointment. . Patient verbalized understanding.[name]    

## 2017-11-05 NOTE — Telephone Encounter (Signed)
Received a fax from [facility] indicating that [name] requires a face-to-face visit to talk about improvement after her CPAP was prescribed.  This has to be completed her health insurance will not cover her equipment and ongoing services for her rental of equipment.  Asked white team to call and have patient come in for a visit for this reason.    In order for her coming to be covered, there has to be an office note indicating some clinical improvement for the patient such as decreased daytime sleepiness or patient stop snoring etc.  The note must also document the compliance with the BiPAP machine for example greater than 4 hours 70% of the time at least 21 days out of 30.

## 2017-11-06 ENCOUNTER — Telehealth: Payer: Self-pay | Admitting: *Deleted

## 2017-11-06 ENCOUNTER — Encounter (INDEPENDENT_AMBULATORY_CARE_PROVIDER_SITE_OTHER): Payer: Self-pay | Admitting: Specialist

## 2017-11-06 ENCOUNTER — Ambulatory Visit (INDEPENDENT_AMBULATORY_CARE_PROVIDER_SITE_OTHER): Payer: Medicare Other | Admitting: Specialist

## 2017-11-06 ENCOUNTER — Other Ambulatory Visit: Payer: Self-pay | Admitting: Student in an Organized Health Care Education/Training Program

## 2017-11-06 VITALS — BP 152/92 | HR 81 | Ht 70.0 in | Wt 378.0 lb

## 2017-11-06 DIAGNOSIS — Z6841 Body Mass Index (BMI) 40.0 and over, adult: Secondary | ICD-10-CM

## 2017-11-06 DIAGNOSIS — S91312S Laceration without foreign body, left foot, sequela: Secondary | ICD-10-CM

## 2017-11-06 DIAGNOSIS — M48062 Spinal stenosis, lumbar region with neurogenic claudication: Secondary | ICD-10-CM

## 2017-11-06 MED ORDER — BACLOFEN 10 MG PO TABS: 20.0000 mg | ORAL_TABLET | Freq: Three times a day (TID) | ORAL | 0 refills | Status: DC

## 2017-11-06 MED ORDER — HYDROCODONE-ACETAMINOPHEN 7.5-325 MG PO TABS: 1.0000 | ORAL_TABLET | Freq: Four times a day (QID) | ORAL | 0 refills | Status: DC | PRN

## 2017-11-06 NOTE — Patient Instructions (Addendum)
Avoid bending, stooping and avoid lifting weights greater than 10 lbs. Avoid prolong standing and walking. Avoid frequent bending and stooping  No lifting greater than 10 lbs. May use ice or moist heat for pain. Weight loss is of benefit. Handicap license is approved. Keep your appointment with Dr. Brien Few and with bariatric surgery.

## 2017-11-06 NOTE — Telephone Encounter (Signed)
-----   Message from [name], MD sent at 11/05/2017  1:26 PM EDT ----- Received a fax from [facility] indicating that [name] requires a face-to-face visit to talk about improvement after her CPAP was prescribed.  This has to be completed her health insurance will not cover her equipment and ongoing services for her rental of equipment.  Please call and ask the patient to schedule a visit for this reason.  Thank you [name], MD PGY-3 [facility]

## 2017-11-06 NOTE — Progress Notes (Signed)
Office Visit Note   Patient: [name]           Date of Birth: 1965-10-13           MRN: [id number] Visit Date: 11/06/2017              Requested by: [name], MD [address] [place], [place] [address] PCP: [name], MD   Assessment & Plan: Visit Diagnoses:  1. Foot laceration, left, sequela   2. Body mass index 45.0-49.9, adult (HCC)   3. Spinal stenosis of lumbar region with neurogenic claudication     Plan:Avoid bending, stooping and avoid lifting weights greater than 10 lbs. Avoid prolong standing and walking. Avoid frequent bending and stooping  No lifting greater than 10 lbs. May use ice or moist heat for pain. Weight loss is of benefit. Handicap license is approved. Keep your appointment with Dr. Brien Few and with bariatric surgery.   Follow-Up Instructions: Return in about 3 months (around 02/06/2018).   Orders:  No orders of the defined types were placed in this encounter.  No orders of the defined types were placed in this encounter.     Procedures: No procedures performed   Clinical Data: No additional findings.   Subjective: Chief Complaint  Patient presents with  . 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 BMI is greater than 54.  She went to class for evaluation for  Bariatric surgery to considered. She has diabetes and fell last Friday 6 days ago causing the left 345 toes to hyperextend and cause skin tears. She is to see Dr. Orpah Melter next week for pain   Review of Systems  Constitutional: Negative.   HENT: Negative.   Eyes: Negative.   Respiratory: Negative.   Cardiovascular: Negative.   Gastrointestinal: Negative.   Endocrine: Negative.   Genitourinary: Negative.   Musculoskeletal: Negative.   Skin: Negative.   Allergic/Immunologic: Negative.   Neurological: Negative.   Hematological: Negative.   Psychiatric/Behavioral: Negative.       Objective: Vital Signs: BP (!) 152/92 (BP Location: Left Arm, Patient Position: Sitting)   Pulse 81   Ht 5\' 10"  (1.778 m)   Wt (!) 378 lb (171.5 kg)   LMP 04/10/2006   BMI 54.24 kg/m   Physical Exam  Ortho Exam  Specialty Comments:  No specialty comments available.  Imaging: No results found.   PMFS History: Patient Active Problem List   Diagnosis Date Noted  . Syncope 11/04/2017  . Left foot pain 11/04/2017  . Open wound of left foot 11/04/2017  . Essential hypertension 10/27/2017  . Solitary pulmonary nodule 10/07/2017  . Acute on chronic congestive heart failure ([place])   . Acute on chronic right-sided congestive heart failure ([place])   . Acute on chronic respiratory failure with hypoxemia ([place])   . Leg edema, right 10/01/2017  . Restrictive lung disease secondary to obesity 10/01/2017  . Polycythemia, secondary 10/01/2017  . Oxygen desaturation 09/30/2017  . Hepatitis B surface antigen positive 07/08/2017  . COPD (chronic obstructive pulmonary disease) with chronic bronchitis ([place]) 06/27/2017  . Chronic kidney disease (CKD), stage IV (severe) ([place]) 01/14/2017  . Type 2 diabetes mellitus with stage 3 chronic kidney disease, with long-term current use of insulin ([place]) 12/12/2016  . Urge incontinence of urine 12/05/2016  . Class 3 severe obesity due to excess calories with serious comorbidity and body mass index (BMI) of 50.0  to 59.9 in adult [facility]) 10/02/2012  . Chronic diastolic heart failure ([place]) 04/07/2012  . GERD 01/26/2010  . Hypertension 08/04/2008  . OSA (obstructive sleep apnea) 02/03/2008  . FIBROCYSTIC BREAST DISEASE 10/28/2006  . Hyperlipidemia 03/27/2006  . Obesity hypoventilation syndrome ([place]) 03/27/2006  . TOBACCO DEPENDENCE 03/27/2006  . Depression with anxiety 03/27/2006   Past Medical History:  Diagnosis Date  . Arthritis 04-10-11   hips, shoulders, back  . Asthma   . Bipolar disorder ([place])   . Cancer ([place]) 1993   cervical, no treatment  done, went away per pt  . Cervical dysplasia or atypia 04-10-11   '93- once dx.-got pregnant-no intervention, then postpartum, no dysplasia found  . CHF (congestive heart failure) ([place])    no cardiologist 2014 dx, none now  . 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 Pathology showed Condyloma, gluteal CONDYLOMA ACUMINATUM.   [name] Kitchen Dyspnea    with actity - better since taking Singular  . Fall 12/16/2016  . Fibrocystic breast disease   . Hypercalcemia   . Hyperlipidemia   . Hypertension   . Migraine headache    none recent  . Morbid obesity ([place]) 03/27/2006  . Neuropathy   . Skin lesion 03/15/2011   In gluteal crease now s/p removal by Dr. [name] of General Surgery on 3/12. Path shows condyloma.     . Sleep apnea 04-10-11   uses cpap, pt does not know settings  . Trigger finger    left third    Family History  Problem Relation Age of Onset  . Stroke Father   . Cancer Maternal Aunt        breast  . Asthma Other   . COPD Other   . Hypertension Other   . Diabetes Other     Past Surgical History:  Procedure Laterality Date  . ANAL FISTULECTOMY  04/17/2011   Procedure: FISTULECTOMY ANAL;  Surgeon: Imogene Burn. [name], MD;  Location: [facility];  Service: General;  Laterality: N/A;  Excision of Condyloma Gluteal Cleft   . BACK SURGERY  04-10-11   x5-Lumbar fusion-retained hardware.(Dr. Louanne Skye)  . BREAST CYST EXCISION     bilateral breast, 3 cysts removed from each breast  . BREAST EXCISIONAL BIOPSY Right    x 3  . BREAST EXCISIONAL BIOPSY Left    x 3  . CARPAL TUNNEL RELEASE Bilateral   . Cervical biospy    . COLONOSCOPY WITH PROPOFOL N/A 07/06/2015   Procedure: COLONOSCOPY WITH PROPOFOL;  Surgeon: [name], MD;  Location: [facility];  Service: Endoscopy;  Laterality: N/A;  . DOPPLER ECHOCARDIOGRAPHY  04/06/2012   AT [place] 55-60%  . I&D EXTREMITY Left 12/18/2016   Procedure: IRRIGATION AND DEBRIDEMENT LEFT FOOT, CLOSURE;  Surgeon: [name], MD;   Location: [place];  Service: Orthopedics;  Laterality: Left;  [name] Kitchen MULTIPLE TOOTH EXTRACTIONS    . NECK SURGERY  04-10-11   x3- cervical fusion with plating and screws-Dr. [name]  . TEE WITHOUT CARDIOVERSION N/A 04/06/2012   Procedure: TRANSESOPHAGEAL ECHOCARDIOGRAM (TEE);  Surgeon: [name], MD;  Location: [facility] ENDOSCOPY;  Service: Cardiovascular;  Laterality: N/A;  . TRIGGER FINGER RELEASE Right    middle finger  . TRIGGER FINGER RELEASE Left 06/28/2016   Procedure: RELEASE TRIGGER FINGER LEFT 3RD FINGER;  Surgeon: [name], MD;  Location: [place];  Service: Orthopedics;  Laterality: Left;  . TRIGGER FINGER RELEASE Right 06/12/2016   Procedure: RIGHT INDEX FINGER TRIGGER RELEASE;  Surgeon: [name], MD;  Location: [place] [facility];  Service: Orthopedics;  Laterality: Right;   Social History   Occupational History  . Not on file  Tobacco Use  . Smoking status: Former Smoker    Packs/day: 1.00    Years: 39.00    Pack years: 39.00    Types: Cigarettes    Last attempt to quit: 09/07/2017    Years since quitting: 0.1  . Smokeless tobacco: Never Used  . Tobacco comment: Quit prior to hospitalization using low dose Chantix  Substance and Sexual Activity  . Alcohol use: No    Alcohol/week: 0.0 standard drinks  . Drug use: Not Currently    Types: Marijuana, "Crack" cocaine    Comment: hx marijuana usemany years ago, Crack -none since 11/2015  . Sexual activity: Yes    Partners: Male

## 2017-11-06 NOTE — Telephone Encounter (Signed)
Contacted pt and she stated that she has an appointment scheduled for next week, I told her that they could discuss this at that visit. Katharina Caper, Rakan Soffer D, [place]

## 2017-11-06 NOTE — Addendum Note (Signed)
Addended by: [name] on: 11/06/2017 05:46 PM   Modules accepted: Orders

## 2017-11-07 ENCOUNTER — Other Ambulatory Visit: Payer: Self-pay | Admitting: Student in an Organized Health Care Education/Training Program

## 2017-11-07 ENCOUNTER — Telehealth: Payer: Self-pay

## 2017-11-07 DIAGNOSIS — J302 Other seasonal allergic rhinitis: Secondary | ICD-10-CM

## 2017-11-07 DIAGNOSIS — E119 Type 2 diabetes mellitus without complications: Secondary | ICD-10-CM | POA: Diagnosis not present

## 2017-11-07 DIAGNOSIS — I10 Essential (primary) hypertension: Secondary | ICD-10-CM | POA: Diagnosis not present

## 2017-11-07 DIAGNOSIS — M150 Primary generalized (osteo)arthritis: Secondary | ICD-10-CM | POA: Diagnosis not present

## 2017-11-07 DIAGNOSIS — S91302D Unspecified open wound, left foot, subsequent encounter: Secondary | ICD-10-CM | POA: Diagnosis not present

## 2017-11-07 NOTE — Telephone Encounter (Signed)
Dorian Pod with [facility] called:  1. Needs to confirm that dressing change to left foot is to be done 3x/ week. 2. Needs parameters for blood sugar so they know when to contact us if too low or too high.  Call back is [phone]  Danley Danker, RN [facility] [place])

## 2017-11-10 ENCOUNTER — Ambulatory Visit (HOSPITAL_COMMUNITY): Payer: Medicare Other | Attending: Cardiology

## 2017-11-10 DIAGNOSIS — I10 Essential (primary) hypertension: Secondary | ICD-10-CM | POA: Diagnosis not present

## 2017-11-10 DIAGNOSIS — R079 Chest pain, unspecified: Secondary | ICD-10-CM | POA: Insufficient documentation

## 2017-11-10 DIAGNOSIS — E119 Type 2 diabetes mellitus without complications: Secondary | ICD-10-CM | POA: Diagnosis not present

## 2017-11-10 DIAGNOSIS — S91302D Unspecified open wound, left foot, subsequent encounter: Secondary | ICD-10-CM | POA: Diagnosis not present

## 2017-11-10 DIAGNOSIS — M150 Primary generalized (osteo)arthritis: Secondary | ICD-10-CM | POA: Diagnosis not present

## 2017-11-10 MED ORDER — TECHNETIUM TC 99M TETROFOSMIN IV KIT
31.6000 | PACK | Freq: Once | INTRAVENOUS | Status: AC | PRN
  Administered 2017-11-10: 31.6 via INTRAVENOUS
  Filled 2017-11-10: qty 32

## 2017-11-10 MED ORDER — REGADENOSON 0.4 MG/5ML IV SOLN
0.4000 mg | Freq: Once | INTRAVENOUS | Status: AC
  Administered 2017-11-10: 0.4 mg via INTRAVENOUS

## 2017-11-11 ENCOUNTER — Ambulatory Visit (HOSPITAL_COMMUNITY): Payer: Medicare Other | Attending: Cardiovascular Disease

## 2017-11-11 DIAGNOSIS — G4733 Obstructive sleep apnea (adult) (pediatric): Secondary | ICD-10-CM | POA: Diagnosis not present

## 2017-11-11 LAB — MYOCARDIAL PERFUSION IMAGING
CHL CUP NUCLEAR SRS: 3
LV dias vol: 10 mL (ref 46–106)
LVSYSVOL: 30 mL
Peak HR: 106 {beats}/min
RATE: 0.54
Rest HR: 97 {beats}/min
SDS: 3
SSS: 6
TID: 0.9

## 2017-11-11 MED ORDER — TECHNETIUM TC 99M TETROFOSMIN IV KIT
31.2000 | PACK | Freq: Once | INTRAVENOUS | Status: DC | PRN
  Filled 2017-11-11: qty 32

## 2017-11-11 NOTE — Telephone Encounter (Signed)
Dorian Pod with [facility] has been informed. [name], CMA

## 2017-11-11 NOTE — Telephone Encounter (Signed)
1. Dressing change 3x per week is fine. 2. Call for CBG less than 60.. If she has persistently elevated blood sugars >300 this would be a reason to call.

## 2017-11-12 ENCOUNTER — Other Ambulatory Visit: Payer: Self-pay

## 2017-11-12 ENCOUNTER — Encounter: Payer: Self-pay | Admitting: Student in an Organized Health Care Education/Training Program

## 2017-11-12 ENCOUNTER — Ambulatory Visit (INDEPENDENT_AMBULATORY_CARE_PROVIDER_SITE_OTHER): Payer: Medicare Other | Admitting: Student in an Organized Health Care Education/Training Program

## 2017-11-12 VITALS — BP 142/76 | HR 51 | Temp 98.7°F | Ht 70.0 in | Wt >= 6400 oz

## 2017-11-12 DIAGNOSIS — Z794 Long term (current) use of insulin: Secondary | ICD-10-CM | POA: Diagnosis not present

## 2017-11-12 DIAGNOSIS — N183 Chronic kidney disease, stage 3 unspecified: Secondary | ICD-10-CM

## 2017-11-12 DIAGNOSIS — E1122 Type 2 diabetes mellitus with diabetic chronic kidney disease: Secondary | ICD-10-CM

## 2017-11-12 DIAGNOSIS — Z8619 Personal history of other infectious and parasitic diseases: Secondary | ICD-10-CM

## 2017-11-12 DIAGNOSIS — J449 Chronic obstructive pulmonary disease, unspecified: Secondary | ICD-10-CM

## 2017-11-12 LAB — POCT GLYCOSYLATED HEMOGLOBIN (HGB A1C): HbA1c, POC (controlled diabetic range): 7.5 % — AB (ref 0.0–7.0)

## 2017-11-12 NOTE — Patient Instructions (Addendum)
It was a pleasure seeing you today in our clinic. Here is the treatment plan we have discussed and agreed upon together:  Use biotene mouth wash.  If your tongue turns white and does not improve with mouth wash, make an appointment to come in so we can take a look at it.  Our clinic's number is [phone]. Please call with questions or concerns about what we discussed today.  Be well, Dr. Burr Medico

## 2017-11-12 NOTE — Progress Notes (Signed)
Subjective:    [name] - 52 y.o. female MRN [id number]  Date of birth: [birth year]  HPI  [name] is here for complaints.  CPAP - with the new CPAP she has had difficulty because the mask is small. She has sleep study scheduled for December 08, 2017.  Since starting CPAP has had improvement in snoring and quality of sleep. She had some improvement in daytime sleepiness with her old CPAP. She feels that the mask and the new CPAP need to be adjusted for her to continue to see this improvement.   Mouth dryness and whiteness of the tongue-patient reports dryness of her mouth due to oxygen, CPAP, and medications.  She reports that this is very bothersome to her.  She takes nystatin swish and swallow which she is asked for multiple refills on.  I asked her if she had ever been diagnosed with thrush.  She reports that her tongue does occasionally turn white.  She reports that she does take a steroid inhaler, however she was having the whiteness of her tongue prior to starting this inhaler.  She reports that she just started inhaler couple of days ago.  she is not immunocompromised as far as she is aware.  Diabetes -HbA1c is 7.5 today from 7.4 when tested 2 months ago.  She reports that she has been 100% compliant with her Lantus 10u, Humolog 3 units TID. ACE/ARB already on board. No low blood sugars.  No new area/polyphagia/polydipsia.  No changes in vision.  COPD on home O2-she requests DME prescription for a lighter oxygen tank.  She reports that she has difficulty getting around with her heavy oxygen tank.  Health Maintenance:  Health Maintenance Due  Topic Date Due  . FOOT EXAM  07/25/1973    -  reports that she quit smoking about 2 months ago. Her smoking use included cigarettes. She has a 39.00 pack-year smoking history. She has never used smokeless tobacco. - Review of Systems: Per HPI. - Past Medical History: Patient Active Problem List   Diagnosis Date Noted  . Syncope  11/04/2017  . Left foot pain 11/04/2017  . Open wound of left foot 11/04/2017  . Essential hypertension 10/27/2017  . Solitary pulmonary nodule 10/07/2017  . Acute on chronic congestive heart failure ([place])   . Acute on chronic right-sided congestive heart failure ([place])   . Acute on chronic respiratory failure with hypoxemia ([place])   . Leg edema, right 10/01/2017  . Restrictive lung disease secondary to obesity 10/01/2017  . Polycythemia, secondary 10/01/2017  . Oxygen desaturation 09/30/2017  . Hepatitis B surface antigen positive 07/08/2017  . COPD (chronic obstructive pulmonary disease) with chronic bronchitis ([place]) 06/27/2017  . Chronic kidney disease (CKD), stage IV (severe) ([place]) 01/14/2017  . Type 2 diabetes mellitus with stage 3 chronic kidney disease, with long-term current use of insulin ([place]) 12/12/2016  . Urge incontinence of urine 12/05/2016  . Class 3 severe obesity due to excess calories with serious comorbidity and body mass index (BMI) of 50.0 to 59.9 in adult ([place]) 10/02/2012  . Chronic diastolic heart failure ([place]) 04/07/2012  . GERD 01/26/2010  . Hypertension 08/04/2008  . OSA (obstructive sleep apnea) 02/03/2008  . FIBROCYSTIC BREAST DISEASE 10/28/2006  . Hyperlipidemia 03/27/2006  . Obesity hypoventilation syndrome ([place]) 03/27/2006  . TOBACCO DEPENDENCE 03/27/2006  . Depression with anxiety 03/27/2006   - Medications: reviewed and updated Current Outpatient Medications  Medication Sig Dispense Refill  . ACCU-CHEK AVIVA PLUS test strip TEST  4 TIMES DAILY 100 each 12  . ACCU-CHEK SOFTCLIX LANCETS lancets Use as instructed 100 each 12  . albuterol (PROAIR HFA) 108 (90 Base) MCG/ACT inhaler USE 1-2 PUFFS INTO LUNGS EVERY 4 HOURS AS NEEDED FOR WHEEZING OR SHORTNESS OF BREATH. 8.5 Inhaler 0  . allopurinol (ZYLOPRIM) 100 MG tablet Take 100 mg by mouth daily.    [name] Kitchen ammonium lactate (AMLACTIN) 12 % lotion Apply 1 application topically 2 (two) times daily as needed for dry  skin. 400 g 0  . atorvastatin (LIPITOR) 40 MG tablet Take 1 tablet (40 mg total) daily by mouth. 90 tablet 3  . baclofen (LIORESAL) 10 MG tablet Take 2 tablets (20 mg total) by mouth 3 (three) times daily. 42 tablet 0  . blood glucose meter kit and supplies KIT Dispense based on patient and insurance preference. Use up to four times daily as directed. (FOR ICD-9 250.00, 250.01). 1 each 0  . busPIRone (BUSPAR) 10 MG tablet Take 1 tablet (10 mg total) by mouth 3 (three) times daily. 90 tablet 11  . Capsaicin-Menthol (PAIN RELIEF EX) Apply 1 application topically 3 (three) times daily as needed (shoulder pain).    . citalopram (CELEXA) 40 MG tablet Take 1 tablet (40 mg total) by mouth daily. 30 tablet 3  . diclofenac sodium (VOLTAREN) 1 % GEL APPLY 2 GRAMS TO AFFECTED AREA 4 TIMES A DAY 300 g 2  . docusate sodium (COLACE) 100 MG capsule Take 200 mg by mouth 2 (two) times daily.    . empagliflozin (JARDIANCE) 25 MG TABS tablet Take 25 mg by mouth daily. 30 tablet 1  . famotidine (PEPCID) 10 MG tablet Take 1 tablet (10 mg total) by mouth daily. 30 tablet 0  . fluticasone (FLONASE) 50 MCG/ACT nasal spray USE 2 SPRAYS EACH NOSTRIL TWICE DAILY. 16 g 1  . furosemide (LASIX) 40 MG tablet Take 1 tablet (40 mg total) by mouth 2 (two) times daily. 60 tablet 0  . gabapentin (NEURONTIN) 300 MG capsule TAKE 2 CAPSULES TWICE A DAY 120 capsule 3  . gabapentin (NEURONTIN) 300 MG capsule Take 2 capsules (600 mg total) by mouth 3 (three) times daily. 180 capsule 3  . HUMALOG KWIKPEN 100 UNIT/ML KiwkPen INJECT 3 UNITS INTO THE SKIN 3 TIMES DAILY BEFORE MEALS 15 pen 2  . HYDROcodone-acetaminophen (NORCO) 7.5-325 MG tablet Take 1-2 tablets by mouth every 6 (six) hours as needed for moderate pain or severe pain. 50 tablet 0  . hydrOXYzine (VISTARIL) 100 MG capsule Take 1 capsule (100 mg total) by mouth 3 (three) times daily as needed for itching. 30 capsule 0  . Insulin Glargine (LANTUS SOLOSTAR) 100 UNIT/ML Solostar Pen  Inject 10 Units into the skin daily. 15 mL 3  . insulin lispro (HUMALOG KWIKPEN) 100 UNIT/ML KiwkPen Inject 0.03 mLs (3 Units total) into the skin 3 (three) times daily before meals. 15 mL 2  . Insulin Pen Needle (B-D UF III MINI PEN NEEDLES) 31G X 5 MM MISC CHECK SUGARS 4 TIMES A DAY BEFORE MEALS AND AT BEDTIME. 100 each 12  . ketoconazole (NIZORAL) 2 % cream Apply 1 fingertip amount to each foot daily. (Patient taking differently: Apply 1 application topically 2 (two) times daily. Apply 1 fingertip amount to each foot TWICE DAILY) 30 g 0  . loratadine (CLARITIN) 10 MG tablet Take 1 tablet (10 mg total) by mouth daily. 90 tablet 0  . losartan (COZAAR) 100 MG tablet Take 1 tablet (100 mg total) by mouth daily. [place]  tablet 3  . montelukast (SINGULAIR) 10 MG tablet Take 1 tablet (10 mg total) by mouth at bedtime. AS DIRECTED 90 tablet 3  . Multiple Vitamins-Minerals (MULTIVITAMIN WITH MINERALS) tablet Take 1 tablet by mouth daily.    . mupirocin ointment (BACTROBAN) 2 % Apply 1 application topically 2 (two) times daily. 22 g 0  . NON FORMULARY Uses a C-PAP at bedtime    . omeprazole (PRILOSEC) 20 MG capsule Take 1 capsule (20 mg total) by mouth daily as needed. (Patient taking differently: Take 20 mg by mouth daily. ) 30 capsule 0  . oxybutynin (DITROPAN XL) 10 MG 24 hr tablet Take 1 tablet (10 mg total) by mouth at bedtime. 30 tablet 0  . TRELEGY ELLIPTA 100-62.5-25 MCG/INH AEPB INHALE 100 MCG INTO THE LUNGS DAILY. 60 each 2  . varenicline (CHANTIX) 0.5 MG tablet Take 1 tablet (0.5 mg total) by mouth 2 (two) times daily. One daily x 3 days BID 57 tablet 3  . Vitamin D, Ergocalciferol, (DRISDOL) 50000 units CAPS capsule Take 50,000 Units by mouth every 7 (seven) days. MONDAYS     No current facility-administered medications for this visit.    Facility-Administered Medications Ordered in Other Visits  Medication Dose Route Frequency Provider Last Rate Last Dose  . technetium tetrofosmin (TC-MYOVIEW)  injection 44.0 millicurie  34.7 millicurie Intravenous Once PRN Turner, Eber Hong, MD        Review of Systems See HPI     Objective:   Physical Exam BP (!) 142/76   Pulse (!) 51   Temp 98.7 F (37.1 C) (Oral)   Ht _0  (1.778 m)   Wt (!) 405 lb 9.6 oz (184 kg)   LMP 04/10/2006   SpO2 90% Comment: _1   BMI 58.20 kg/m  Gen: NAD, alert, cooperative with exam, chronically ill-appearing female HEENT: NCAT, PERRL, clear conjunctiva, oropharynx clear, supple neck. No oral thrush. Tongue appears normal.  CV: RRR, good S1/S2, no murmur, 1+pitting edema bilaterally, capillary refill brisk  Resp: CTABL, no wheezes, non-labored, O2 by [place] in place Abd: SNTND, BS present, no guarding or organomegaly Skin: no rashes, normal turgor  Neuro: no gross deficits.  Psych: good insight, alert and oriented     Assessment & Plan:   1.  Diabetes -A1c 7.5.  Continue current management as noted above.  2.  Dry mouth-recommend Biotene mouthwash.  Will not refill nystatin at this time.   - Will check HIV given the patient's concern that she has oral thrush, and her symptoms of thrush predate starting her steroid inhaler.  -  If she does get thrush symptoms she should come in to be evaluated at that time so that we can diagnose it in the clinic.  3.  Chronic lung disease on oxygen -she asked for DME order for a lighter oxygen tank at today's visit.  Paper prescription provided.  Patient continues to have shortness of breath on exertion.  Her lungs sound normal in the office today.  She has not heard from pulmonology although this referral was placed per chart review.  I will replace that referral today.  4.  Sleep apnea-patient was recently given a new CPAP machine.  She feels that he needs to be fitted and titrated.  She has an appointment with sleep medicine on November 11 for this to be adjusted.  Reports 100% compliance with the CPAP machine.  Orders Placed This Encounter  Procedures  . HIV Antibody  (routine testing w rflx)  . Ambulatory  referral to Pulmonology    Referral Priority:   Routine    Referral Type:   Consultation    Referral Reason:   Specialty Services Required    Requested Specialty:   Pulmonary Disease    Number of Visits Requested:   1  . POCT glycosylated hemoglobin (Hb A1C)    No orders of the defined types were placed in this encounter.   [name], MD,MS,  PGY3 11/12/2017 5:43 PM

## 2017-11-13 ENCOUNTER — Encounter: Payer: Self-pay | Admitting: Student in an Organized Health Care Education/Training Program

## 2017-11-13 ENCOUNTER — Other Ambulatory Visit (INDEPENDENT_AMBULATORY_CARE_PROVIDER_SITE_OTHER): Payer: Self-pay | Admitting: Specialist

## 2017-11-13 ENCOUNTER — Encounter (INDEPENDENT_AMBULATORY_CARE_PROVIDER_SITE_OTHER): Payer: Self-pay | Admitting: Specialist

## 2017-11-13 DIAGNOSIS — M961 Postlaminectomy syndrome, not elsewhere classified: Secondary | ICD-10-CM | POA: Insufficient documentation

## 2017-11-13 DIAGNOSIS — R29898 Other symptoms and signs involving the musculoskeletal system: Secondary | ICD-10-CM | POA: Diagnosis not present

## 2017-11-13 DIAGNOSIS — M5412 Radiculopathy, cervical region: Secondary | ICD-10-CM | POA: Diagnosis not present

## 2017-11-13 DIAGNOSIS — R200 Anesthesia of skin: Secondary | ICD-10-CM | POA: Diagnosis not present

## 2017-11-13 LAB — HIV ANTIBODY (ROUTINE TESTING W REFLEX): HIV SCREEN 4TH GENERATION: NONREACTIVE

## 2017-11-13 NOTE — Telephone Encounter (Signed)
Nitka patient 

## 2017-11-13 NOTE — Telephone Encounter (Signed)
Norco refill request for Dr. Louanne Skye patient

## 2017-11-13 NOTE — Telephone Encounter (Signed)
-----   Message from [name], MD sent at 11/12/2017  9:39 AM EDT ----- Low risk stress test. Good function, no electrical changes. No evidence of blockage. A small spot at the tip of the heart may be a small scar, but more likely we see an artifact here from the test in women (breast attenuation). Overall looks good.

## 2017-11-13 NOTE — Telephone Encounter (Addendum)
Spoke with pt who states she has been having episodes of chest tightness, feeling dizzy and SOB with exertion. Pt states she was told her heart is stiff and she has a low heart rate and feels symptoms could be related to that. Pt also state she has notice when she pick something  with her left hand, she cant hold on to it. She report she was recently seen by Dr. Annamaria Boots and had a CT. Pt offered an appointment today 11/13/17 for further evaluations but pt declined and states she only want to be seen by Dr. Harrell Gave. Pt states she will call 911 if symptoms gets worse. Appointment scheduled for 11/21/17 at 940 am.   Pt also updated with stress test result. Verbalized understanding.

## 2017-11-13 NOTE — Telephone Encounter (Signed)
Too soon for refil

## 2017-11-14 ENCOUNTER — Other Ambulatory Visit: Payer: Self-pay | Admitting: Student in an Organized Health Care Education/Training Program

## 2017-11-14 ENCOUNTER — Other Ambulatory Visit: Payer: Self-pay | Admitting: Internal Medicine

## 2017-11-14 DIAGNOSIS — E119 Type 2 diabetes mellitus without complications: Secondary | ICD-10-CM | POA: Diagnosis not present

## 2017-11-14 DIAGNOSIS — I10 Essential (primary) hypertension: Secondary | ICD-10-CM | POA: Diagnosis not present

## 2017-11-14 DIAGNOSIS — S91302D Unspecified open wound, left foot, subsequent encounter: Secondary | ICD-10-CM | POA: Diagnosis not present

## 2017-11-14 DIAGNOSIS — E8770 Fluid overload, unspecified: Secondary | ICD-10-CM

## 2017-11-14 DIAGNOSIS — M150 Primary generalized (osteo)arthritis: Secondary | ICD-10-CM | POA: Diagnosis not present

## 2017-11-14 DIAGNOSIS — R600 Localized edema: Secondary | ICD-10-CM

## 2017-11-14 DIAGNOSIS — R0789 Other chest pain: Secondary | ICD-10-CM

## 2017-11-14 LAB — HM DIABETES EYE EXAM

## 2017-11-14 NOTE — Telephone Encounter (Signed)
Norco refill request 

## 2017-11-14 NOTE — Telephone Encounter (Signed)
Baclofen refill request 

## 2017-11-17 ENCOUNTER — Telehealth: Payer: Self-pay | Admitting: Student in an Organized Health Care Education/Training Program

## 2017-11-17 ENCOUNTER — Encounter (INDEPENDENT_AMBULATORY_CARE_PROVIDER_SITE_OTHER): Payer: Self-pay | Admitting: Specialist

## 2017-11-17 DIAGNOSIS — E119 Type 2 diabetes mellitus without complications: Secondary | ICD-10-CM | POA: Diagnosis not present

## 2017-11-17 DIAGNOSIS — S91302D Unspecified open wound, left foot, subsequent encounter: Secondary | ICD-10-CM | POA: Diagnosis not present

## 2017-11-17 DIAGNOSIS — M150 Primary generalized (osteo)arthritis: Secondary | ICD-10-CM | POA: Diagnosis not present

## 2017-11-17 DIAGNOSIS — I10 Essential (primary) hypertension: Secondary | ICD-10-CM | POA: Diagnosis not present

## 2017-11-17 MED ORDER — HYDROCODONE-ACETAMINOPHEN 7.5-325 MG PO TABS: 1.0000 | ORAL_TABLET | Freq: Four times a day (QID) | ORAL | 0 refills | Status: DC | PRN

## 2017-11-17 NOTE — Telephone Encounter (Signed)
Dorian Pod from [facility] was calling to speak with Dr. Burr Medico about orders for this patient. She would like to discuss the care they have given for her foot. The best contact number is [phone].

## 2017-11-18 ENCOUNTER — Other Ambulatory Visit (INDEPENDENT_AMBULATORY_CARE_PROVIDER_SITE_OTHER): Payer: Self-pay | Admitting: Specialist

## 2017-11-18 ENCOUNTER — Encounter: Payer: Self-pay | Admitting: Student in an Organized Health Care Education/Training Program

## 2017-11-18 NOTE — Telephone Encounter (Signed)
Spoke with [name] the home health nurse. She asks whether [name] can be released from home health for her foot wound. I saw the wound at her last OV and it was healing well. [name] also sent me a mychart message today asking if she may be released from [facility] services. This seems reasonable. Will plan to recheck the wound at her next OV on 11/15.

## 2017-11-18 NOTE — Telephone Encounter (Signed)
This is a JN pt.  

## 2017-11-18 NOTE — Telephone Encounter (Signed)
Called back the number and spoke with Clarise Cruz who will put a call out to the nurse. Gave the # I am sitting at [facility] for the next 10 mns otherwise asked them to leave a message through the front.

## 2017-11-18 NOTE — Telephone Encounter (Signed)
Patient I aware that her rx is ready for pick up at the front desk

## 2017-11-20 ENCOUNTER — Other Ambulatory Visit (INDEPENDENT_AMBULATORY_CARE_PROVIDER_SITE_OTHER): Payer: Self-pay | Admitting: Specialist

## 2017-11-20 DIAGNOSIS — I10 Essential (primary) hypertension: Secondary | ICD-10-CM | POA: Diagnosis not present

## 2017-11-20 DIAGNOSIS — E119 Type 2 diabetes mellitus without complications: Secondary | ICD-10-CM | POA: Diagnosis not present

## 2017-11-20 DIAGNOSIS — S91302D Unspecified open wound, left foot, subsequent encounter: Secondary | ICD-10-CM | POA: Diagnosis not present

## 2017-11-20 DIAGNOSIS — M150 Primary generalized (osteo)arthritis: Secondary | ICD-10-CM | POA: Diagnosis not present

## 2017-11-20 NOTE — Telephone Encounter (Signed)
Gabapentin refill request----wanting a 90 day supply

## 2017-11-21 ENCOUNTER — Ambulatory Visit (INDEPENDENT_AMBULATORY_CARE_PROVIDER_SITE_OTHER): Payer: Medicare Other | Admitting: Specialist

## 2017-11-21 ENCOUNTER — Encounter: Payer: Self-pay | Admitting: Cardiology

## 2017-11-21 ENCOUNTER — Ambulatory Visit (INDEPENDENT_AMBULATORY_CARE_PROVIDER_SITE_OTHER): Payer: Medicare Other | Admitting: Cardiology

## 2017-11-21 VITALS — BP 146/76 | HR 110 | Ht 70.0 in | Wt 398.0 lb

## 2017-11-21 DIAGNOSIS — I5032 Chronic diastolic (congestive) heart failure: Secondary | ICD-10-CM

## 2017-11-21 DIAGNOSIS — J9621 Acute and chronic respiratory failure with hypoxia: Secondary | ICD-10-CM | POA: Diagnosis not present

## 2017-11-21 DIAGNOSIS — I10 Essential (primary) hypertension: Secondary | ICD-10-CM | POA: Diagnosis not present

## 2017-11-21 DIAGNOSIS — R0602 Shortness of breath: Secondary | ICD-10-CM | POA: Diagnosis not present

## 2017-11-21 DIAGNOSIS — Z6841 Body Mass Index (BMI) 40.0 and over, adult: Secondary | ICD-10-CM

## 2017-11-21 DIAGNOSIS — R55 Syncope and collapse: Secondary | ICD-10-CM

## 2017-11-21 NOTE — Patient Instructions (Signed)
Medication Instructions:  Your Physician recommend you continue on your current medication as directed.    If you need a refill on your cardiac medications before your next appointment, please call your pharmacy.   Lab work: None   Testing/Procedures: Our physician has recommended that you wear an 7 DAY ZIO-PATCH monitor. The Zio patch cardiac monitor continuously records heart rhythm data for up to 14 days, this is for patients being evaluated for multiple types heart rhythms. For the first 24 hours post application, please avoid getting the Zio monitor wet in the shower or by excessive sweating during exercise. After that, feel free to carry on with regular activities. Keep soaps and lotions away from the ZIO XT Patch.   This will be placed at our [facility] location - [address], [address].      Follow-Up: At [facility], you and your health needs are our priority.  As part of our continuing mission to provide you with exceptional heart care, we have created designated Provider Care Teams.  These Care Teams include your primary Cardiologist (physician) and Advanced Practice Providers (APPs -  Physician Assistants and Nurse Practitioners) who all work together to provide you with the care you need, when you need it. You will need a follow up appointment in 3 months.  Please call our office 2 months in advance to schedule this appointment.  You may see [name], MD or one of the following Advanced Practice Providers on your designated Care Team:   Rosaria Ferries, PA-C . Jory Sims, DNP, ANP  Any Other Special Instructions Will Be Listed Below (If Applicable).

## 2017-11-21 NOTE — Progress Notes (Signed)
Cardiology Office Note:    Date:  86/16/8372   ID:  [name], DOB [birth year], MRN [id number]  PCP:  [name], MD  Cardiologist:  [name], MD PhD  Referring MD: [name], MD   CC: shortness of breath, "breathing crises", syncope  History of Present Illness:    [name] is a 52 y.o. female with a hx of morbid obesity (BMI 58.8), type II diabetes, heart failure with preserved ejection fraction, hypertension who is seen in follow up for the evaluation and management of shortness of breath, chest tightness, and edema.  Initial consult was 10/27/17. She was admitted from 9/3-9/4 for acute on chronic diastolic heart failure. She was discharged on furosemide 40 mg BID and home oxygen of 2L when ambulating. She has sleep apnea and uses CPAP at home.  Her concerns at that time were severe shortness of breath associated with chest pain multiple times per day with even minimal exertion. We performed a lexiscan on 11/11/17 which was low risk, normal EF, no ST changes, possible apical septal infarct vs. Artifact, no ischemia.   Today:  Continues to feel terrible. Has breathing "crises" where she feels short of breath. Feels tight in her chest and like she might fall. She did have one episode when she fell and she thought it was because her knee gave out, but now she thinks maybe she blacked out. Feels that more of these spells (though not all) results in brief spells of syncope. Asked several times to clarify, and the best she can gather is that it is when she has her breathing crisis and exerting herself. She feels like her heart beats slowly during these events. No clear presyncopal symptoms. No palpitations. Out for only a brief time. Has occurred with exertion or with sitting still. Has breathing crises multiple times/day.   -Prior cardiac history: diastolic dysfunction on echo, clinical HFpEF with hospitalizations -Tobacco: quit 09/08/17. Was 1 ppd x 40 years.    -Comorbidities: morbid obesity, type II diabetes, hypertension -Exercise level: none. On constant home O2.  Past Medical History:  Diagnosis Date  . Arthritis 04-10-11   hips, shoulders, back  . Asthma   . Bipolar disorder ([place])   . Cancer ([place]) 1993   cervical, no treatment done, went away per pt  . Cervical dysplasia or atypia 04-10-11   '93- once dx.-got pregnant-no intervention, then postpartum, no dysplasia found  . CHF (congestive heart failure) ([place])    no cardiologist 2014 dx, none now  . 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 Pathology showed Condyloma, gluteal CONDYLOMA ACUMINATUM.   [name] Kitchen Dyspnea    with actity - better since taking Singular  . Fall 12/16/2016  . Fibrocystic breast disease   . Hypercalcemia   . Hyperlipidemia   . Hypertension   . Migraine headache    none recent  . Morbid obesity ([place]) 03/27/2006  . Neuropathy   . Skin lesion 03/15/2011   In gluteal crease now s/p removal by Dr. [name] of General Surgery on 3/12. Path shows condyloma.     . Sleep apnea 04-10-11   uses cpap, pt does not know settings  . Trigger finger    left third    Past Surgical History:  Procedure Laterality Date  . ANAL FISTULECTOMY  04/17/2011   Procedure: FISTULECTOMY ANAL;  Surgeon: Imogene Burn. [name], MD;  Location: [facility];  Service: General;  Laterality: N/A;  Excision of Condyloma Gluteal Cleft   .  BACK SURGERY  04-10-11   x5-Lumbar fusion-retained hardware.(Dr. Louanne Skye)  . BREAST CYST EXCISION     bilateral breast, 3 cysts removed from each breast  . BREAST EXCISIONAL BIOPSY Right    x 3  . BREAST EXCISIONAL BIOPSY Left    x 3  . CARPAL TUNNEL RELEASE Bilateral   . Cervical biospy    . COLONOSCOPY WITH PROPOFOL N/A 07/06/2015   Procedure: COLONOSCOPY WITH PROPOFOL;  Surgeon: [name], MD;  Location: [facility];  Service: Endoscopy;  Laterality: N/A;  . DOPPLER ECHOCARDIOGRAPHY  04/06/2012   AT [place] 55-60%  . I&D EXTREMITY Left  12/18/2016   Procedure: IRRIGATION AND DEBRIDEMENT LEFT FOOT, CLOSURE;  Surgeon: [name], MD;  Location: [place];  Service: Orthopedics;  Laterality: Left;  [name] Kitchen MULTIPLE TOOTH EXTRACTIONS    . NECK SURGERY  04-10-11   x3- cervical fusion with plating and screws-Dr. [name]  . TEE WITHOUT CARDIOVERSION N/A 04/06/2012   Procedure: TRANSESOPHAGEAL ECHOCARDIOGRAM (TEE);  Surgeon: [name], MD;  Location: [facility] ENDOSCOPY;  Service: Cardiovascular;  Laterality: N/A;  . TRIGGER FINGER RELEASE Right    middle finger  . TRIGGER FINGER RELEASE Left 06/28/2016   Procedure: RELEASE TRIGGER FINGER LEFT 3RD FINGER;  Surgeon: [name], MD;  Location: [place];  Service: Orthopedics;  Laterality: Left;  . TRIGGER FINGER RELEASE Right 06/12/2016   Procedure: RIGHT INDEX FINGER TRIGGER RELEASE;  Surgeon: [name], MD;  Location: [place];  Service: Orthopedics;  Laterality: Right;    Current Medications: Current Outpatient Medications on File Prior to Visit  Medication Sig  . ACCU-CHEK AVIVA PLUS test strip TEST 4 TIMES DAILY  . ACCU-CHEK SOFTCLIX LANCETS lancets Use as instructed  . albuterol (PROAIR HFA) 108 (90 Base) MCG/ACT inhaler USE 1-2 PUFFS INTO LUNGS EVERY 4 HOURS AS NEEDED FOR WHEEZING OR SHORTNESS OF BREATH.  [name] Kitchen allopurinol (ZYLOPRIM) 100 MG tablet Take 100 mg by mouth daily.  [name] Kitchen ammonium lactate (AMLACTIN) 12 % lotion Apply 1 application topically 2 (two) times daily as needed for dry skin.  [name] Kitchen atorvastatin (LIPITOR) 40 MG tablet Take 1 tablet (40 mg total) daily by mouth.  . baclofen (LIORESAL) 10 MG tablet TAKE 2 TABLETS (20 MG TOTAL) BY MOUTH 3 (THREE) TIMES DAILY.  . blood glucose meter kit and supplies KIT Dispense based on patient and insurance preference. Use up to four times daily as directed. (FOR ICD-9 250.00, 250.01).  . busPIRone (BUSPAR) 10 MG tablet Take 1 tablet (10 mg total) by mouth 3 (three) times daily.  . Capsaicin-Menthol (PAIN RELIEF EX) Apply 1 application topically 3  (three) times daily as needed (shoulder pain).  . citalopram (CELEXA) 40 MG tablet Take 1 tablet (40 mg total) by mouth daily.  . diclofenac sodium (VOLTAREN) 1 % GEL APPLY 2 GRAMS TO AFFECTED AREA 4 TIMES A DAY  . docusate sodium (COLACE) 100 MG capsule Take 200 mg by mouth 2 (two) times daily.  . empagliflozin (JARDIANCE) 25 MG TABS tablet Take 25 mg by mouth daily.  . famotidine (PEPCID) 10 MG tablet Take 1 tablet (10 mg total) by mouth daily.  . fluticasone (FLONASE) 50 MCG/ACT nasal spray USE 2 SPRAYS EACH NOSTRIL TWICE DAILY.  . furosemide (LASIX) 40 MG tablet Take 1 tablet (40 mg total) by mouth 2 (two) times daily.  [name] Kitchen gabapentin (NEURONTIN) 300 MG capsule TAKE 2 CAPSULES (600 MG TOTAL) BY MOUTH 3 (THREE) TIMES DAILY.  [name] Kitchen HYDROcodone-acetaminophen (NORCO) 7.5-325 MG tablet Take  1-2 tablets by mouth every 6 (six) hours as needed for moderate pain or severe pain.  . hydrOXYzine (VISTARIL) 100 MG capsule Take 1 capsule (100 mg total) by mouth 3 (three) times daily as needed for itching.  . Insulin Glargine (LANTUS SOLOSTAR) 100 UNIT/ML Solostar Pen Inject 10 Units into the skin daily.  . insulin lispro (HUMALOG KWIKPEN) 100 UNIT/ML KiwkPen Inject 0.03 mLs (3 Units total) into the skin 3 (three) times daily before meals.  . Insulin Pen Needle (B-D UF III MINI PEN NEEDLES) 31G X 5 MM MISC CHECK SUGARS 4 TIMES A DAY BEFORE MEALS AND AT BEDTIME.  [name] Kitchen ketoconazole (NIZORAL) 2 % cream Apply 1 fingertip amount to each foot daily. (Patient taking differently: Apply 1 application topically 2 (two) times daily. Apply 1 fingertip amount to each foot TWICE DAILY)  . loratadine (CLARITIN) 10 MG tablet Take 1 tablet (10 mg total) by mouth daily.  [name] Kitchen losartan (COZAAR) 100 MG tablet Take 1 tablet (100 mg total) by mouth daily.  . montelukast (SINGULAIR) 10 MG tablet Take 1 tablet (10 mg total) by mouth at bedtime. AS DIRECTED  . Multiple Vitamins-Minerals (MULTIVITAMIN WITH MINERALS) tablet Take 1 tablet by mouth  daily.  . mupirocin ointment (BACTROBAN) 2 % Apply 1 application topically 2 (two) times daily.  . NON FORMULARY Uses a C-PAP at bedtime  . omeprazole (PRILOSEC) 20 MG capsule Take 1 capsule (20 mg total) by mouth daily as needed. (Patient taking differently: Take 20 mg by mouth daily. )  . oxybutynin (DITROPAN XL) 10 MG 24 hr tablet Take 1 tablet (10 mg total) by mouth at bedtime.  . TRELEGY ELLIPTA 100-62.5-25 MCG/INH AEPB INHALE 100 MCG INTO THE LUNGS DAILY.  [name] Kitchen varenicline (CHANTIX) 0.5 MG tablet Take 1 tablet (0.5 mg total) by mouth 2 (two) times daily. One daily x 3 days BID  . Vitamin D, Ergocalciferol, (DRISDOL) 50000 units CAPS capsule Take 50,000 Units by mouth every 7 (seven) days. MONDAYS  . [DISCONTINUED] sucralfate (CARAFATE) 1 G tablet Take 1 tablet (1 g total) by mouth daily.   Current Facility-Administered Medications on File Prior to Visit  Medication  . technetium tetrofosmin (TC-MYOVIEW) injection 61.9 millicurie     Allergies:   Chantix [varenicline tartrate]; Methadone hcl; and Levofloxacin   Social History   Socioeconomic History  . Marital status: Divorced    Spouse name: Not on file  . Number of children: Not on file  . Years of education: Not on file  . Highest education level: Not on file  Occupational History  . Not on file  Social Needs  . Financial resource strain: Not on file  . Food insecurity:    Worry: Never true    Inability: Often true  . Transportation needs:    Medical: Not on file    Non-medical: Not on file  Tobacco Use  . Smoking status: Former Smoker    Packs/day: 1.00    Years: 39.00    Pack years: 39.00    Types: Cigarettes    Last attempt to quit: 09/07/2017    Years since quitting: 0.2  . Smokeless tobacco: Never Used  . Tobacco comment: Quit prior to hospitalization using low dose Chantix  Substance and Sexual Activity  . Alcohol use: No    Alcohol/week: 0.0 standard drinks  . Drug use: Not Currently    Types: Marijuana,  "Crack" cocaine    Comment: hx marijuana usemany years ago, Crack -none since 11/2015  . Sexual activity: Yes  Partners: Male  Lifestyle  . Physical activity:    Days per week: Not on file    Minutes per session: Not on file  . Stress: Not on file  Relationships  . Social connections:    Talks on phone: Not on file    Gets together: Not on file    Attends religious service: Not on file    Active member of club or organization: Not on file    Attends meetings of clubs or organizations: Not on file    Relationship status: Not on file  Other Topics Concern  . Not on file  Social History Narrative   Current Social History 01/23/2017        Who lives at home: Patient lives alone in one level home 01/23/2017    Transportation: Patient has own vehicle and drives herself 07/28/1599   Important Relationships "Family and friends" 01/23/2017    Pets: None 01/23/2017   Education / Work:  12 th grade/ None 01/23/2017   Interests / Fun: "Play bingo on phone, watch TV, Be with family and friends, sit on my porch." 01/23/2017   Current Stressors: None 01/23/2017   Religious / Personal Beliefs: "God Jesus" 01/23/2017   Other: "I love everyone, I wake up with joy in my heart and go to sleep the same way. I'm a lovable person." 01/23/2017   L. Ducatte, RN, BSN                                                                                                   Family History: The patient's family history includes Asthma in her other; COPD in her other; Cancer in her maternal aunt; Diabetes in her other; Hypertension in her other; Stroke in her father. no CAD known. Father died age 46 from stroke, mother died age 43 of blood clot. Has a 76 year old child with sickle cell disease.  ROS:   Please see the history of present illness.  Additional pertinent ROS: Constitutional: Negative for chills, fever, night sweats, unintentional weight loss  HENT: Negative for ear pain and hearing loss.   Eyes:  Negative for loss of vision and eye pain.  Respiratory: Positive for shortness of breath. Negative for cough, sputum,  wheezing.   Cardiovascular: Positive for chest tightness, mild LE edema. Negative for palpitations, PND, orthopnea (sleeps on right side due to back), and claudication.  Gastrointestinal: Negative for abdominal pain, melena, and hematochezia.  Genitourinary: Negative for dysuria and hematuria.  Musculoskeletal: Negative for falls. Positive for muscle spasms. Skin: Negative for itching and rash.  Neurological: Negative for focal weakness, focal sensory changes and loss of consciousness.  Endo/Heme/Allergies: Does not bruise/bleed easily.   EKGs/Labs/Other Studies Reviewed:    The following studies were reviewed today: Echo 9.4.19 Study Conclusions  - Left ventricle: The cavity size was normal. Wall thickness was   normal. Systolic function was normal. The estimated ejection   fraction was in the range of 55% to 60%. Wall motion was normal;   there were no regional wall motion abnormalities. Doppler   parameters are consistent with abnormal  left ventricular   relaxation (grade 1 diastolic dysfunction).  Lexiscan MPI 11/11/17 Study Highlights    Nuclear stress EF: 71%.  The left ventricular ejection fraction is hyperdynamic (>65%).  There was no ST segment deviation noted during stress.  There is a small defect of mild severity present in the apical septal location. The defect is non-reversible. No ischemia noted.  This is a low risk study.     EKG:  EKG is ordered today.  The ekg ordered today demonstrates normal sinus rhythm with atrial bigeminy, poor R wave progression  Recent Labs: 09/30/2017: B Natriuretic Peptide 45.0 10/01/2017: ALT 31; TSH 0.419 10/03/2017: Hemoglobin 15.0; Platelets 301 10/07/2017: Magnesium 1.9 10/16/2017: BUN 18; Creatinine, Ser 1.31; Potassium 4.5; Sodium 144  Recent Lipid Panel    Component Value Date/Time   CHOL 145 10/27/2017  0944   TRIG 98 10/27/2017 0944   HDL 55 10/27/2017 0944   CHOLHDL 2.6 10/27/2017 0944   CHOLHDL 4.0 04/17/2015 0917   VLDL 34 (H) 04/17/2015 0917   LDLCALC 70 10/27/2017 0944   LDLDIRECT 114 (H) 09/15/2012 1559    Physical Exam:    VS:  BP (!) 146/76   Pulse (!) 110   Ht 5' 10" (1.778 m)   Wt (!) 398 lb (180.5 kg)   LMP 04/10/2006   BMI 57.11 kg/m     Wt Readings from Last 3 Encounters:  11/21/17 (!) 398 lb (180.5 kg)  11/12/17 (!) 405 lb 9.6 oz (184 kg)  11/10/17 (!) 409 lb (185.5 kg)     GEN: Well nourished, well developed in no acute distress. O2 via nasal cannula in place. HEENT: Normal NECK: No apparent JVD (difficult due to body habitus); No carotid bruits LYMPHATICS: No lymphadenopathy CARDIAC: distant heart sounds, regular rhythm, normal S1 and S2, no murmurs, rubs, gallops. Radial and DP pulses 2+ bilaterally. RESPIRATORY:  Distant but clear to auscultation without rales, wheezing or rhonchi  ABDOMEN: Soft, non-tender, non-distended MUSCULOSKELETAL:  No pitting edema, but does have some chronic brawny edema bilaterally; No deformity  SKIN: Warm and dry NEUROLOGIC:  Alert and oriented x 3 PSYCHIATRIC:  Normal affect   ASSESSMENT:    1. Syncope, unspecified syncope type   2. Shortness of breath   3. Chronic diastolic heart failure ([place])   4. Essential hypertension   5. Acute on chronic respiratory failure with hypoxemia (HCC)   6. Class 3 severe obesity due to excess calories with serious comorbidity and body mass index (BMI) of 50.0 to 59.9 in adult [facility])    PLAN:    1. Syncope: she has difficulty fully explaining these episodes but endorsed several times that she fully lost consciousness and felt her heart beating slow. Her baseline ECG does not show any evidence of conduction system disease.   -will order 7 day monitor to evaluate for arrhythmia as a cause for her syncope  -has already had echo and lexiscan stress test, unremarkable  2. Chest pain,  shortness of breath: this may be related to obesity hypoventilation syndrome, underlying lung disease, less likely her chronic diastolic heart failure.[name] Kitchen  -lexiscan myoview for ischemia was low risk  -getting long term monitor as above for arrhythmia evaluation  -echo unremarkable, only grade 1 diastolic dysfunction  3. Class 3 obesity, BMI 57.1 her obesity is causing life threatening complications, including chronic respiratory failure requiring oxygen likely due to obesity hypoventilation syndrome. Her weight is also significantly contributing to her chronic diastolic heart failure, and with a recent hospitalization for  acute exacerbation, minimizing contributing factors to her heart failure with preserved ejection fraction is key. I fully support her efforts to pursue gastric bypass surgery given her comorbid conditions.   4. Hypercholesterolemia: LDL was 70 on 10/27/17, at goal. Continue atorvastatin 40 mg. Coronary artery calcification seen on chest CT, but no ischemia on recent lexiscan.  5. Hypertension: has been well controlled recently, slightly elevated today but she is anxious about her heart. No changes to medications today. (was 115/72 on 11/03/17)  6. Chronic diastolic heart failure: On SGLT2 inhibitor, with benefit for both heart failure and her type II diabetes.   -counseled on daily weights, fluid restriction, salt guidelines, when to seek immediate medical attention  7. OSA on CPAP: being managed by PCP  8. Prevention and cardiac disease counseling, including heart failure education:   The 10-year ASCVD risk score Mikey Bussing DC Jr., et al., 2013) is: 23.5%   Values used to calculate the score:     Age: 64 years     Sex: Female     Is Non-Hispanic African American: Yes     Diabetic: Yes     Tobacco smoker: Yes     Systolic Blood Pressure: 742 mmHg     Is BP treated: Yes     HDL Cholesterol: 55 mg/dL     Total Cholesterol: 145 mg/dL  Plan for follow up: 3 mos  Medication  Adjustments/Labs and Tests Ordered: Current medicines are reviewed at length with the patient today.  Concerns regarding medicines are outlined above.  Orders Placed This Encounter  Procedures  . LONG TERM MONITOR (3-14 DAYS)  . EKG 12-Lead   No orders of the defined types were placed in this encounter.   Patient Instructions  Medication Instructions:  Your Physician recommend you continue on your current medication as directed.    If you need a refill on your cardiac medications before your next appointment, please call your pharmacy.   Lab work: None   Testing/Procedures: Our physician has recommended that you wear an 7 DAY ZIO-PATCH monitor. The Zio patch cardiac monitor continuously records heart rhythm data for up to 14 days, this is for patients being evaluated for multiple types heart rhythms. For the first 24 hours post application, please avoid getting the Zio monitor wet in the shower or by excessive sweating during exercise. After that, feel free to carry on with regular activities. Keep soaps and lotions away from the ZIO XT Patch.   This will be placed at our [facility] location - [address].      Follow-Up: At [facility], you and your health needs are our priority.  As part of our continuing mission to provide you with exceptional heart care, we have created designated Provider Care Teams.  These Care Teams include your primary Cardiologist (physician) and Advanced Practice Providers (APPs -  Physician Assistants and Nurse Practitioners) who all work together to provide you with the care you need, when you need it. You will need a follow up appointment in 3 months.  Please call our office 2 months in advance to schedule this appointment.  You may see [name], MD or one of the following Advanced Practice Providers on your designated Care Team:   Rosaria Ferries, PA-C . Jory Sims, DNP, ANP  Any Other Special Instructions Will Be  Listed Below (If Applicable).       Signed, [name], MD PhD 11/21/2017 10:13 AM    [facility]

## 2017-11-26 ENCOUNTER — Encounter: Payer: Self-pay | Admitting: Student in an Organized Health Care Education/Training Program

## 2017-11-26 ENCOUNTER — Other Ambulatory Visit: Payer: Self-pay | Admitting: Pharmacist

## 2017-11-26 DIAGNOSIS — N183 Chronic kidney disease, stage 3 (moderate): Principal | ICD-10-CM

## 2017-11-26 DIAGNOSIS — Z794 Long term (current) use of insulin: Secondary | ICD-10-CM

## 2017-11-26 DIAGNOSIS — E1122 Type 2 diabetes mellitus with diabetic chronic kidney disease: Secondary | ICD-10-CM

## 2017-11-27 ENCOUNTER — Encounter (INDEPENDENT_AMBULATORY_CARE_PROVIDER_SITE_OTHER): Payer: Self-pay | Admitting: Specialist

## 2017-11-27 ENCOUNTER — Other Ambulatory Visit (INDEPENDENT_AMBULATORY_CARE_PROVIDER_SITE_OTHER): Payer: Self-pay | Admitting: Specialist

## 2017-11-27 ENCOUNTER — Other Ambulatory Visit (INDEPENDENT_AMBULATORY_CARE_PROVIDER_SITE_OTHER): Payer: Self-pay | Admitting: Radiology

## 2017-11-27 ENCOUNTER — Encounter: Payer: Self-pay | Admitting: Student in an Organized Health Care Education/Training Program

## 2017-11-28 ENCOUNTER — Ambulatory Visit
Admission: RE | Admit: 2017-11-28 | Discharge: 2017-11-28 | Disposition: A | Discharge status: Home / Self Care | Payer: Medicare Other | Source: Ambulatory Visit | Attending: Family Medicine | Admitting: Family Medicine

## 2017-11-28 ENCOUNTER — Encounter (INDEPENDENT_AMBULATORY_CARE_PROVIDER_SITE_OTHER): Payer: Self-pay | Admitting: Specialist

## 2017-11-28 ENCOUNTER — Other Ambulatory Visit: Payer: Self-pay | Admitting: Student in an Organized Health Care Education/Training Program

## 2017-11-28 DIAGNOSIS — Z1231 Encounter for screening mammogram for malignant neoplasm of breast: Secondary | ICD-10-CM

## 2017-11-28 DIAGNOSIS — K146 Glossodynia: Secondary | ICD-10-CM

## 2017-11-28 DIAGNOSIS — R911 Solitary pulmonary nodule: Secondary | ICD-10-CM

## 2017-11-28 DIAGNOSIS — R768 Other specified abnormal immunological findings in serum: Secondary | ICD-10-CM

## 2017-11-28 HISTORY — DX: Other specified abnormal immunological findings in serum: R76.8

## 2017-11-28 HISTORY — DX: Solitary pulmonary nodule: R91.1

## 2017-11-28 MED ORDER — TRAMADOL HCL 50 MG PO TABS: 50.0000 mg | ORAL_TABLET | Freq: Three times a day (TID) | ORAL | 0 refills | Status: DC | PRN

## 2017-11-28 MED ORDER — NYSTATIN 100000 UNIT/ML MT SUSP: 5.0000 mL | Freq: Four times a day (QID) | OROMUCOSAL | 3 refills | Status: DC

## 2017-11-28 MED ORDER — BACLOFEN 10 MG PO TABS: 20.0000 mg | ORAL_TABLET | Freq: Three times a day (TID) | ORAL | 0 refills | Status: DC

## 2017-11-28 NOTE — Addendum Note (Signed)
Addended by: Minda Ditto, Alyse Low N on: 11/28/2017 02:55 PM   Modules accepted: Orders

## 2017-11-28 NOTE — Progress Notes (Signed)
Called and spoke with patient regarding generalized mouth and tongue pain.  For her glossitis, recommended that she continue taking her multivitamin and make sure that it has been vitamins in it.  Additionally encouraged her to use Flonase to help open up nasal passages.  Encouraged her to rinse out her mouth with water or Biotene after using her steroid inhaler.    She reports that she is already practicing all of these recommendations.    We can do the nystatin oral suspension swish and swallow, which is the only thing that seems to help her pain.  However she cannot refill this more than once per month.  Discussed risks of being on long-term nystatin therapy.  She understands and is agreeable with this plan.  [name], MD PGY-3 [facility]

## 2017-11-28 NOTE — Telephone Encounter (Signed)
I called and advised patient that I called rx in to [facility], she states that she can't take this medicine. I advised her not to pick up the rx then and to resubmit her request next week.

## 2017-11-28 NOTE — Telephone Encounter (Signed)
Change to Ultram 50 mg 1 tab p.o. every 8 hours as needed number 60 tablets no refills

## 2017-11-28 NOTE — Telephone Encounter (Signed)
Baclofen refill request 

## 2017-11-30 ENCOUNTER — Other Ambulatory Visit: Payer: Self-pay | Admitting: Internal Medicine

## 2017-11-30 ENCOUNTER — Other Ambulatory Visit: Payer: Self-pay | Admitting: Student in an Organized Health Care Education/Training Program

## 2017-11-30 DIAGNOSIS — R600 Localized edema: Secondary | ICD-10-CM

## 2017-11-30 DIAGNOSIS — J449 Chronic obstructive pulmonary disease, unspecified: Secondary | ICD-10-CM

## 2017-11-30 DIAGNOSIS — E8770 Fluid overload, unspecified: Secondary | ICD-10-CM

## 2017-11-30 DIAGNOSIS — R0789 Other chest pain: Secondary | ICD-10-CM

## 2017-11-30 MED ORDER — EMPAGLIFLOZIN 25 MG PO TABS: 25.0000 mg | ORAL_TABLET | Freq: Every day | ORAL | 0 refills | Status: DC

## 2017-12-01 ENCOUNTER — Encounter: Payer: Self-pay | Admitting: Student in an Organized Health Care Education/Training Program

## 2017-12-01 ENCOUNTER — Ambulatory Visit (INDEPENDENT_AMBULATORY_CARE_PROVIDER_SITE_OTHER): Payer: Medicare Other | Admitting: Family

## 2017-12-01 ENCOUNTER — Encounter: Payer: Self-pay | Admitting: Family

## 2017-12-01 VITALS — BP 189/99 | HR 72 | Temp 98.8°F | Wt >= 6400 oz

## 2017-12-01 DIAGNOSIS — I10 Essential (primary) hypertension: Secondary | ICD-10-CM | POA: Diagnosis not present

## 2017-12-01 DIAGNOSIS — B181 Chronic viral hepatitis B without delta-agent: Secondary | ICD-10-CM

## 2017-12-01 NOTE — Patient Instructions (Signed)
Nice to see you!  We will check your blood work today.  No medication or additional treatment is needed at this time.  Recommend follow up with your PCP or nephrologist every 6-12 months to check your Hepatitis B blood work.  Follow up with ID as needed.

## 2017-12-01 NOTE — Assessment & Plan Note (Addendum)
[name] has chronic inactive Hepatitis B with no evidence of cirrhosis on imaging. She remains asymptomatic. Recheck blood work today. There does not appear to be any further need for ID follow up at this time as her Hepatitis B can be monitored by her primary care team every 6-12 months. I am happy to see her back as needed.

## 2017-12-01 NOTE — Progress Notes (Signed)
Subjective:    Patient ID: [name], female    DOB: 1965/04/07, 52 y.o.   MRN: [id number]  Chief Complaint  Patient presents with  . Hepatitis B     HPI:  [name] is a 52 y.o. female who presents today for routine follow up of Hepatitis B.  [name] was previously seen in the office on 07/08/17 for Hepatitis B and found to have Hepatitis B with a positive E and Core antibodies and viral load of <10. This was consistent with inactive chronic Hepatitis B. CT scan of the abdomin and pelvis with no cirrhosis. Most recent AST/ALT of 31/31.  [name] has been doing well and not currently on medication. Denies abdominal pain, scleral icterus, nausea, vomiting or diarrhea.      Allergies  Allergen Reactions  . Chantix [Varenicline Tartrate] Other (See Comments)    On the 1 mg dose, patient experienced hallucintation  . 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 Levofloxacin Itching      Outpatient Medications Prior to Visit  Medication Sig Dispense Refill  . ACCU-CHEK AVIVA PLUS test strip TEST 4 TIMES DAILY 100 each 12  . ACCU-CHEK SOFTCLIX LANCETS lancets Use as instructed 100 each 12  . albuterol (PROAIR HFA) 108 (90 Base) MCG/ACT inhaler USE 1-2 PUFFS INTO LUNGS EVERY 4 HOURS AS NEEDED FOR WHEEZING OR SHORTNESS OF BREATH. 8.5 Inhaler 0  . allopurinol (ZYLOPRIM) 100 MG tablet Take 100 mg by mouth daily.    [name] Kitchen ammonium lactate (AMLACTIN) 12 % lotion Apply 1 application topically 2 (two) times daily as needed for dry skin. 400 g 0  . atorvastatin (LIPITOR) 40 MG tablet Take 1 tablet (40 mg total) daily by mouth. 90 tablet 3  . baclofen (LIORESAL) 10 MG tablet Take 2 tablets (20 mg total) by mouth 3 (three) times daily. 42 tablet 0  . blood glucose meter kit and supplies KIT Dispense based on patient and insurance preference. Use up to four times daily as directed. (FOR ICD-9 250.00, 250.01). 1 each 0  . busPIRone (BUSPAR) 10 MG tablet Take  1 tablet (10 mg total) by mouth 3 (three) times daily. 90 tablet 11  . Capsaicin-Menthol (PAIN RELIEF EX) Apply 1 application topically 3 (three) times daily as needed (shoulder pain).    . citalopram (CELEXA) 40 MG tablet Take 1 tablet (40 mg total) by mouth daily. 30 tablet 3  . diclofenac sodium (VOLTAREN) 1 % GEL APPLY 2 GRAMS TO AFFECTED AREA 4 TIMES A DAY 300 g 2  . docusate sodium (COLACE) 100 MG capsule Take 200 mg by mouth 2 (two) times daily.    . empagliflozin (JARDIANCE) 25 MG TABS tablet Take 25 mg by mouth daily. 90 tablet 0  . famotidine (PEPCID) 10 MG tablet Take 1 tablet (10 mg total) by mouth daily. 30 tablet 0  . fluticasone (FLONASE) 50 MCG/ACT nasal spray USE 2 SPRAYS EACH NOSTRIL TWICE DAILY. 16 g 1  . furosemide (LASIX) 40 MG tablet Take 1 tablet (40 mg total) by mouth 2 (two) times daily. 60 tablet 0  . gabapentin (NEURONTIN) 300 MG capsule TAKE 2 CAPSULES (600 MG TOTAL) BY MOUTH 3 (THREE) TIMES DAILY. 540 capsule 2  . HYDROcodone-acetaminophen (NORCO) 7.5-325 MG tablet Take 1-2 tablets by mouth every 6 (six) hours as needed for moderate pain or severe pain. 50 tablet 0  . hydrOXYzine (VISTARIL) 100 MG capsule Take 1 capsule (100 mg  total) by mouth 3 (three) times daily as needed for itching. 30 capsule 0  . Insulin Glargine (LANTUS SOLOSTAR) 100 UNIT/ML Solostar Pen Inject 10 Units into the skin daily. 15 mL 3  . insulin lispro (HUMALOG KWIKPEN) 100 UNIT/ML KiwkPen Inject 0.03 mLs (3 Units total) into the skin 3 (three) times daily before meals. 15 mL 2  . Insulin Pen Needle (B-D UF III MINI PEN NEEDLES) 31G X 5 MM MISC CHECK SUGARS 4 TIMES A DAY BEFORE MEALS AND AT BEDTIME. 100 each 12  . ketoconazole (NIZORAL) 2 % cream Apply 1 fingertip amount to each foot daily. (Patient taking differently: Apply 1 application topically 2 (two) times daily. Apply 1 fingertip amount to each foot TWICE DAILY) 30 g 0  . loratadine (CLARITIN) 10 MG tablet Take 1 tablet (10 mg total) by mouth  daily. 90 tablet 0  . losartan (COZAAR) 100 MG tablet Take 1 tablet (100 mg total) by mouth daily. 90 tablet 3  . montelukast (SINGULAIR) 10 MG tablet Take 1 tablet (10 mg total) by mouth at bedtime. AS DIRECTED 90 tablet 3  . Multiple Vitamins-Minerals (MULTIVITAMIN WITH MINERALS) tablet Take 1 tablet by mouth daily.    . mupirocin ointment (BACTROBAN) 2 % Apply 1 application topically 2 (two) times daily. 22 g 0  . NON FORMULARY Uses a C-PAP at bedtime    . nystatin (MYCOSTATIN) 100000 UNIT/ML suspension Take 5 mLs (500,000 Units total) by mouth 4 (four) times daily. Swish and swallow. 60 mL 3  . omeprazole (PRILOSEC) 20 MG capsule Take 1 capsule (20 mg total) by mouth daily as needed. (Patient taking differently: Take 20 mg by mouth daily. ) 30 capsule 0  . oxybutynin (DITROPAN XL) 10 MG 24 hr tablet Take 1 tablet (10 mg total) by mouth at bedtime. 30 tablet 0  . traMADol (ULTRAM) 50 MG tablet Take 1 tablet (50 mg total) by mouth every 8 (eight) hours as needed. 60 tablet 0  . TRELEGY ELLIPTA 100-62.5-25 MCG/INH AEPB INHALE 100 MCG INTO THE LUNGS DAILY. 60 each 2  . varenicline (CHANTIX) 0.5 MG tablet Take 1 tablet (0.5 mg total) by mouth 2 (two) times daily. One daily x 3 days BID 57 tablet 3  . Vitamin D, Ergocalciferol, (DRISDOL) 50000 units CAPS capsule Take 50,000 Units by mouth every 7 (seven) days. MONDAYS     Facility-Administered Medications Prior to Visit  Medication Dose Route Frequency Provider Last Rate Last Dose  . technetium tetrofosmin (TC-MYOVIEW) injection 22.9 millicurie  79.8 millicurie Intravenous Once PRN Sueanne Margarita, MD         Past Medical History:  Diagnosis Date  . Arthritis 04-10-11   hips, shoulders, back  . Asthma   . Bipolar disorder ([place])   . Cancer ([place]) 1993   cervical, no treatment done, went away per pt  . Cervical dysplasia or atypia 04-10-11   '93- once dx.-got pregnant-no intervention, then postpartum, no dysplasia found  . CHF (congestive  heart failure) ([place])    no cardiologist 2014 dx, none now  . 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 Pathology showed Condyloma, gluteal CONDYLOMA ACUMINATUM.   [name] Kitchen Dyspnea    with actity - better since taking Singular  . Fall 12/16/2016  . Fibrocystic breast disease   . Hypercalcemia   . Hyperlipidemia   . Hypertension   . Migraine headache    none recent  . Morbid obesity ([place]) 03/27/2006  . Neuropathy   .  Skin lesion 03/15/2011   In gluteal crease now s/p removal by Dr. [name] of General Surgery on 3/12. Path shows condyloma.     . Sleep apnea 04-10-11   uses cpap, pt does not know settings  . Trigger finger    left third     Past Surgical History:  Procedure Laterality Date  . ANAL FISTULECTOMY  04/17/2011   Procedure: FISTULECTOMY ANAL;  Surgeon: Imogene Burn. [name], MD;  Location: [facility];  Service: General;  Laterality: N/A;  Excision of Condyloma Gluteal Cleft   . BACK SURGERY  04-10-11   x5-Lumbar fusion-retained hardware.(Dr. Louanne Skye)  . BREAST CYST EXCISION     bilateral breast, 3 cysts removed from each breast  . BREAST EXCISIONAL BIOPSY Right    x 3  . BREAST EXCISIONAL BIOPSY Left    x 3  . CARPAL TUNNEL RELEASE Bilateral   . Cervical biospy    . COLONOSCOPY WITH PROPOFOL N/A 07/06/2015   Procedure: COLONOSCOPY WITH PROPOFOL;  Surgeon: [name], MD;  Location: [facility];  Service: Endoscopy;  Laterality: N/A;  . DOPPLER ECHOCARDIOGRAPHY  04/06/2012   AT [place] 55-60%  . I&D EXTREMITY Left 12/18/2016   Procedure: IRRIGATION AND DEBRIDEMENT LEFT FOOT, CLOSURE;  Surgeon: [name], MD;  Location: [place];  Service: Orthopedics;  Laterality: Left;  [name] Kitchen MULTIPLE TOOTH EXTRACTIONS    . NECK SURGERY  04-10-11   x3- cervical fusion with plating and screws-Dr. [name]  . TEE WITHOUT CARDIOVERSION N/A 04/06/2012   Procedure: TRANSESOPHAGEAL ECHOCARDIOGRAM (TEE);  Surgeon: [name], MD;  Location: [facility] ENDOSCOPY;  Service: Cardiovascular;   Laterality: N/A;  . TRIGGER FINGER RELEASE Right    middle finger  . TRIGGER FINGER RELEASE Left 06/28/2016   Procedure: RELEASE TRIGGER FINGER LEFT 3RD FINGER;  Surgeon: [name], MD;  Location: [place];  Service: Orthopedics;  Laterality: Left;  . TRIGGER FINGER RELEASE Right 06/12/2016   Procedure: RIGHT INDEX FINGER TRIGGER RELEASE;  Surgeon: [name], MD;  Location: [place];  Service: Orthopedics;  Laterality: Right;       Review of Systems  Constitutional: Negative for chills, fatigue, fever and unexpected weight change.  Respiratory: Negative for cough, chest tightness, shortness of breath and wheezing.   Cardiovascular: Negative for chest pain and leg swelling.  Gastrointestinal: Negative for abdominal distention, constipation, diarrhea, nausea and vomiting.  Neurological: Negative for dizziness, weakness, light-headedness and headaches.  Hematological: Does not bruise/bleed easily.      Objective:    BP (!) 189/99   Pulse 72   Temp 98.8 F (37.1 C) (Oral)   Wt (!) 402 lb (182.3 kg)   LMP 04/10/2006   SpO2 92%   BMI 57.68 kg/m  Nursing note and vital signs reviewed.  Physical Exam  Constitutional: She is oriented to person, place, and time. She appears well-developed. No distress.  Cardiovascular: Normal rate, regular rhythm, normal heart sounds and intact distal pulses. Exam reveals no gallop and no friction rub.  No murmur heard. Pulmonary/Chest: Effort normal and breath sounds normal. No respiratory distress. She has no wheezes. She has no rales. She exhibits no tenderness.  Abdominal: Soft. Bowel sounds are normal. She exhibits no distension, no ascites and no mass. There is no hepatosplenomegaly. There is no tenderness. There is no rebound and no guarding.  Neurological: She is alert and oriented to person, place, and time.  Skin: Skin is warm and dry.  Psychiatric: She has a normal mood and affect.  Her behavior is normal. Judgment and thought content  normal.       Assessment & Plan:   Problem List Items Addressed This Visit      Digestive   Chronic viral hepatitis B without delta-agent ([place]) - Primary    Ms. Yanni has chronic inactive Hepatitis B with no evidence of cirrhosis on imaging. She remains asymptomatic. Recheck blood work today. There does not appear to be any further need for ID follow up at this time as her Hepatitis B can be monitored by her primary care team every 6-12 months. I am happy to see her back as needed.       Relevant Orders   Hepatitis B DNA, ultraquantitative, PCR   Hepatitis B surface antigen   Hepatitis B surface antibody,qualitative     Other   Hypertension (Chronic)    Blood pressure elevated today with no headaches or changes in vision. Emphasized importance of continued medication and dietary changes. Changes in regimen per primary care or nephrology.          I am having [name] maintain her NON FORMULARY, atorvastatin, blood glucose meter kit and supplies, ACCU-CHEK SOFTCLIX LANCETS, busPIRone, omeprazole, hydrOXYzine, insulin lispro, losartan, loratadine, montelukast, albuterol, ammonium lactate, ketoconazole, docusate sodium, multivitamin with minerals, Vitamin D (Ergocalciferol), allopurinol, Insulin Pen Needle, varenicline, citalopram, diclofenac sodium, Capsaicin-Menthol (PAIN RELIEF EX), furosemide, famotidine, TRELEGY ELLIPTA, oxybutynin, ACCU-CHEK AVIVA PLUS, Insulin Glargine, mupirocin ointment, fluticasone, HYDROcodone-acetaminophen, gabapentin, baclofen, nystatin, traMADol, and empagliflozin.   No orders of the defined types were placed in this encounter.    Follow-up: As needed  Terri Piedra, MSN, FNP-C Nurse Practitioner [facility] Office phone: [phone] Pager: [place] number: [phone]

## 2017-12-01 NOTE — Assessment & Plan Note (Signed)
Blood pressure elevated today with no headaches or changes in vision. Emphasized importance of continued medication and dietary changes. Changes in regimen per primary care or nephrology.

## 2017-12-02 ENCOUNTER — Other Ambulatory Visit (INDEPENDENT_AMBULATORY_CARE_PROVIDER_SITE_OTHER): Payer: Self-pay | Admitting: Surgery

## 2017-12-02 ENCOUNTER — Ambulatory Visit (INDEPENDENT_AMBULATORY_CARE_PROVIDER_SITE_OTHER): Payer: Medicare Other

## 2017-12-02 ENCOUNTER — Encounter (INDEPENDENT_AMBULATORY_CARE_PROVIDER_SITE_OTHER): Payer: Self-pay | Admitting: Specialist

## 2017-12-02 ENCOUNTER — Other Ambulatory Visit: Payer: Self-pay

## 2017-12-02 ENCOUNTER — Other Ambulatory Visit (INDEPENDENT_AMBULATORY_CARE_PROVIDER_SITE_OTHER): Payer: Self-pay | Admitting: Radiology

## 2017-12-02 ENCOUNTER — Encounter: Payer: Self-pay | Admitting: Student in an Organized Health Care Education/Training Program

## 2017-12-02 DIAGNOSIS — E8770 Fluid overload, unspecified: Secondary | ICD-10-CM

## 2017-12-02 DIAGNOSIS — R600 Localized edema: Secondary | ICD-10-CM

## 2017-12-02 DIAGNOSIS — R55 Syncope and collapse: Secondary | ICD-10-CM | POA: Diagnosis not present

## 2017-12-02 DIAGNOSIS — R0789 Other chest pain: Secondary | ICD-10-CM

## 2017-12-02 MED ORDER — FUROSEMIDE 40 MG PO TABS: 40.0000 mg | ORAL_TABLET | Freq: Two times a day (BID) | ORAL | 0 refills | Status: DC

## 2017-12-02 MED ORDER — HYDROCODONE-ACETAMINOPHEN 7.5-325 MG PO TABS: 1.0000 | ORAL_TABLET | Freq: Four times a day (QID) | ORAL | 0 refills | Status: DC | PRN

## 2017-12-03 NOTE — Telephone Encounter (Signed)
Ok to rf? 

## 2017-12-03 NOTE — Telephone Encounter (Signed)
I called and advised her rx was ready for pick up

## 2017-12-04 DIAGNOSIS — J9621 Acute and chronic respiratory failure with hypoxia: Secondary | ICD-10-CM | POA: Diagnosis not present

## 2017-12-04 LAB — HEPATITIS B DNA, ULTRAQUANTITATIVE, PCR
Hepatitis B DNA (Calc): <1 Log IU/mL — ABNORMAL HIGH
Hepatitis B DNA: <10 IU/mL — ABNORMAL HIGH

## 2017-12-04 LAB — HEPATITIS B SURFACE ANTIGEN: Hepatitis B Surface Ag: REACTIVE — AB

## 2017-12-04 LAB — HEPATITIS B SURFACE ANTIBODY,QUALITATIVE: Hep B S Ab: NONREACTIVE

## 2017-12-05 ENCOUNTER — Other Ambulatory Visit (INDEPENDENT_AMBULATORY_CARE_PROVIDER_SITE_OTHER): Payer: Self-pay

## 2017-12-05 MED ORDER — BACLOFEN 10 MG PO TABS: 20.0000 mg | ORAL_TABLET | Freq: Three times a day (TID) | ORAL | 0 refills | Status: DC

## 2017-12-05 NOTE — Telephone Encounter (Signed)
Baclofen refill request 

## 2017-12-07 ENCOUNTER — Encounter: Payer: Self-pay | Admitting: Neurology

## 2017-12-08 ENCOUNTER — Encounter: Payer: Self-pay | Admitting: Neurology

## 2017-12-08 ENCOUNTER — Encounter: Payer: Medicare Other | Admitting: Neurology

## 2017-12-08 ENCOUNTER — Telehealth: Payer: Self-pay | Admitting: Neurology

## 2017-12-08 VITALS — BP 146/80 | HR 83 | Ht 70.0 in | Wt >= 6400 oz

## 2017-12-08 NOTE — Progress Notes (Signed)
Subjective:    Patient ID: [name] is a 52 y.o. female.  HPI   We will reschedule patient for another date. Patient agreeable. No charge for today. Pt reports she has a problem with cockroaches at home (CPAP machine and CPAP bag had cockroaches in them). I feel it would be safest to reschedule.   Review of Systems  Neurological:       Sleep consult. Alone. Rm 1. Patient mentioned that she hasn't had a sleep study done in 10 years. She also mentioned that her DME company will not change the pressure until she has a recent sleep study done. She does use her cpap machine every night.   [place] Sleepiness Scale 0= would never doze 1= slight chance of dozing 2= moderate chance of dozing 3= high chance of dozing  Sitting and reading:3 Watching TV:3 Sitting inactive in a public place (ex. Theater or meeting):0 As a passenger in a car for an hour without a break:0 Lying down to rest in the afternoon:3 Sitting and talking to someone:3 Sitting quietly after lunch (no alcohol):3 In a car, while stopped in traffic:0 Total:15       Objective:  Neurological Exam  Physical Exam  Assessment:    This encounter was created in error - please disregard.

## 2017-12-08 NOTE — Telephone Encounter (Signed)
Dr. Rexene Alberts has requested for pt. To be r/s.

## 2017-12-09 ENCOUNTER — Encounter: Payer: Self-pay | Admitting: Neurology

## 2017-12-10 ENCOUNTER — Encounter: Payer: Self-pay | Admitting: Podiatry

## 2017-12-10 ENCOUNTER — Ambulatory Visit (INDEPENDENT_AMBULATORY_CARE_PROVIDER_SITE_OTHER): Payer: Medicare Other | Admitting: Podiatry

## 2017-12-10 DIAGNOSIS — M79674 Pain in right toe(s): Secondary | ICD-10-CM

## 2017-12-10 DIAGNOSIS — E1142 Type 2 diabetes mellitus with diabetic polyneuropathy: Secondary | ICD-10-CM

## 2017-12-10 DIAGNOSIS — M2010 Hallux valgus (acquired), unspecified foot: Secondary | ICD-10-CM

## 2017-12-10 DIAGNOSIS — B351 Tinea unguium: Secondary | ICD-10-CM

## 2017-12-10 DIAGNOSIS — M79675 Pain in left toe(s): Secondary | ICD-10-CM

## 2017-12-10 NOTE — Progress Notes (Signed)
Complaint:  Visit Type: Patient returns to my office for continued preventative foot care services. Complaint: Patient states" my nails have grown long and thick and become painful to walk and wear shoes" Patient has been diagnosed with DM with neuropathy and is taking gabapentin.. The patient presents for preventative foot care services. No changes to ROS.  Patient requests an evaluation for diabetic shoes.  Podiatric Exam: Vascular: dorsalis pedis and posterior tibial pulses are palpable bilateral. Capillary return is immediate. Temperature gradient is WNL. Skin turgor WNL  Sensorium: Normal Semmes Weinstein monofilament test. Normal tactile sensation bilaterally. Nail Exam: Pt has thick disfigured discolored nails with subungual debris noted bilateral entire nail hallux through fifth toenails Ulcer Exam: There is no evidence of ulcer or pre-ulcerative changes or infection. Orthopedic Exam: Muscle tone and strength are WNL. No limitations in general ROM. No crepitus or effusions noted. Foot type and digits show no abnormalities. 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 No infection or ulcers.  Thick hyperkeratotic skin right foot.  Diagnosis:  Onychomycosis, , Pain in right toe, pain in left toes,  Diabetes with neuropathy.  Treatment & Plan Procedures and Treatment: Consent by patient was obtained for treatment procedures.   Debridement of mycotic and hypertrophic toenails, 1 through 5 bilateral and clearing of subungual debris. No ulceration, no infection noted. Patient has continued dry plantar skin.  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 Patient to follow up with [facility]. Return Visit-Office Procedure: Patient instructed to return to the office for a follow up visit 10 weeks  for continued evaluation and treatment.    Gardiner Barefoot DPM

## 2017-12-12 ENCOUNTER — Encounter (INDEPENDENT_AMBULATORY_CARE_PROVIDER_SITE_OTHER): Payer: Self-pay | Admitting: Specialist

## 2017-12-12 ENCOUNTER — Other Ambulatory Visit (INDEPENDENT_AMBULATORY_CARE_PROVIDER_SITE_OTHER): Payer: Self-pay | Admitting: Radiology

## 2017-12-12 ENCOUNTER — Ambulatory Visit (INDEPENDENT_AMBULATORY_CARE_PROVIDER_SITE_OTHER): Payer: Medicare Other | Admitting: Student in an Organized Health Care Education/Training Program

## 2017-12-12 ENCOUNTER — Other Ambulatory Visit: Payer: Self-pay

## 2017-12-12 ENCOUNTER — Encounter: Payer: Self-pay | Admitting: Student in an Organized Health Care Education/Training Program

## 2017-12-12 VITALS — BP 148/86 | HR 73 | Temp 99.3°F | Ht 70.0 in | Wt >= 6400 oz

## 2017-12-12 DIAGNOSIS — M961 Postlaminectomy syndrome, not elsewhere classified: Secondary | ICD-10-CM | POA: Diagnosis not present

## 2017-12-12 DIAGNOSIS — E1151 Type 2 diabetes mellitus with diabetic peripheral angiopathy without gangrene: Secondary | ICD-10-CM | POA: Diagnosis not present

## 2017-12-12 DIAGNOSIS — Z794 Long term (current) use of insulin: Secondary | ICD-10-CM | POA: Diagnosis not present

## 2017-12-12 DIAGNOSIS — N183 Chronic kidney disease, stage 3 (moderate): Secondary | ICD-10-CM

## 2017-12-12 DIAGNOSIS — I10 Essential (primary) hypertension: Secondary | ICD-10-CM | POA: Diagnosis not present

## 2017-12-12 DIAGNOSIS — E1122 Type 2 diabetes mellitus with diabetic chronic kidney disease: Secondary | ICD-10-CM

## 2017-12-12 DIAGNOSIS — R55 Syncope and collapse: Secondary | ICD-10-CM | POA: Diagnosis not present

## 2017-12-12 DIAGNOSIS — G4733 Obstructive sleep apnea (adult) (pediatric): Secondary | ICD-10-CM | POA: Diagnosis not present

## 2017-12-12 DIAGNOSIS — B353 Tinea pedis: Secondary | ICD-10-CM | POA: Diagnosis not present

## 2017-12-12 DIAGNOSIS — M5416 Radiculopathy, lumbar region: Secondary | ICD-10-CM | POA: Diagnosis not present

## 2017-12-12 DIAGNOSIS — M545 Low back pain: Secondary | ICD-10-CM | POA: Diagnosis not present

## 2017-12-12 LAB — POCT GLYCOSYLATED HEMOGLOBIN (HGB A1C): HBA1C, POC (CONTROLLED DIABETIC RANGE): 7.2 % — AB (ref 0.0–7.0)

## 2017-12-12 MED ORDER — AMMONIUM LACTATE 12 % EX LOTN: 1.0000 "application " | TOPICAL_LOTION | Freq: Two times a day (BID) | CUTANEOUS | 0 refills | Status: DC | PRN

## 2017-12-12 NOTE — Telephone Encounter (Signed)
Hydrocodone refill. 

## 2017-12-12 NOTE — Addendum Note (Signed)
Addended by: Minda Ditto, Alyse Low N on: 12/12/2017 12:34 PM   Modules accepted: Orders

## 2017-12-12 NOTE — Telephone Encounter (Signed)
Sent request to Dr. Nitka 

## 2017-12-12 NOTE — Progress Notes (Signed)
   CC: follow up  HPI: [name] is a 52 y.o. female   Chronic medical conditions - patient reports that she has an appointment for pulmonology next week. She additionally is following with bariatric surgery and reports she is working with the dietitian for 6 months and then plans to undergo weight loss surgery this summer. She also follows with pain management and plans to go epidural injections for her chronic back pain.  Tinea Pedis/Dry skin  Patient notes that she has severe dry skin over the bottom of her left foot worse than right foot. She has followed with podiatry and was prescribed ammonium lactate which helps. She asks if I can refill this today.  Review of Symptoms:  See HPI for ROS.   CC, SH/smoking status, and VS noted.  Objective: BP (!) 148/86   Pulse 73   Temp 99.3 F (37.4 C) (Oral)   Ht 5\' 10"  (1.778 m)   Wt (!) 406 lb (184.2 kg)   LMP 04/10/2006   SpO2 91% Comment: @2L   BMI 58.25 kg/m  GEN: NAD, alert, cooperative, and pleasant. RESPIRATORY: Comfortable work of breathing, speaks in full sentences CV: Regular rate noted, distal extremities well perfused and warm without edema GI: Soft, nondistended SKIN: warm and dry, no rashes or lesions. +notable dry skin over the bottom of left foot. No skin breakdown or lesions on bilateral feet. NEURO: II-XII grossly intact, pinprick sensation intact over distal feet bilaterally MSK: Moves 4 extremities equally PSYCH: AAOx3, appropriate affect   Assessment and plan:  1. Tinea pedis of both feet - left worse than right. refilled amlactin - ammonium lactate (AMLACTIN) 12 % lotion; Apply 1 application topically 2 (two) times daily as needed for dry skin.  Dispense: 400 g; Refill: 0   Meds ordered this encounter  Medications  . ammonium lactate (AMLACTIN) 12 % lotion    Sig: Apply 1 application topically 2 (two) times daily as needed for dry skin.    Dispense:  400 g    Refill:  0     [name], MD,MS,   PGY3 12/12/2017 8:54 AM

## 2017-12-12 NOTE — Patient Instructions (Signed)
It was a pleasure seeing you today in our clinic.   I sent your moisturizing foot cream to your pharmacy.  Our clinic's number is [phone]. Please call with questions or concerns about what we discussed today.  Be well, Dr. Burr Medico

## 2017-12-15 ENCOUNTER — Other Ambulatory Visit (INDEPENDENT_AMBULATORY_CARE_PROVIDER_SITE_OTHER): Payer: Self-pay | Admitting: Radiology

## 2017-12-15 ENCOUNTER — Encounter (INDEPENDENT_AMBULATORY_CARE_PROVIDER_SITE_OTHER): Payer: Self-pay | Admitting: Specialist

## 2017-12-15 NOTE — Telephone Encounter (Signed)
Hydrocodone refill request.  

## 2017-12-15 NOTE — Addendum Note (Signed)
Addended by: Minda Ditto, Alyse Low N on: 12/15/2017 12:11 PM   Modules accepted: Orders

## 2017-12-15 NOTE — Telephone Encounter (Signed)
I have sent a request to Dr. Louanne Skye

## 2017-12-16 ENCOUNTER — Encounter (INDEPENDENT_AMBULATORY_CARE_PROVIDER_SITE_OTHER): Payer: Self-pay | Admitting: Specialist

## 2017-12-16 ENCOUNTER — Ambulatory Visit: Payer: Medicare Other | Admitting: Orthotics

## 2017-12-16 ENCOUNTER — Other Ambulatory Visit (INDEPENDENT_AMBULATORY_CARE_PROVIDER_SITE_OTHER): Payer: Self-pay | Admitting: Specialist

## 2017-12-16 DIAGNOSIS — M2010 Hallux valgus (acquired), unspecified foot: Secondary | ICD-10-CM

## 2017-12-16 DIAGNOSIS — E1142 Type 2 diabetes mellitus with diabetic polyneuropathy: Secondary | ICD-10-CM

## 2017-12-16 MED ORDER — HYDROCODONE-ACETAMINOPHEN 7.5-325 MG PO TABS: 1.0000 | ORAL_TABLET | Freq: Four times a day (QID) | ORAL | 0 refills | Status: DC | PRN

## 2017-12-16 NOTE — Progress Notes (Signed)
[facility] website consulted, no other prescibers. Hydrocodone renewed.  Sent to pharmacy with impravata. Previous printed Rx voided and placed in shred-it box.

## 2017-12-16 NOTE — Progress Notes (Signed)

## 2017-12-17 ENCOUNTER — Encounter: Payer: Self-pay | Admitting: Student in an Organized Health Care Education/Training Program

## 2017-12-17 NOTE — Telephone Encounter (Signed)
Baclofen refill request 

## 2017-12-19 ENCOUNTER — Ambulatory Visit (INDEPENDENT_AMBULATORY_CARE_PROVIDER_SITE_OTHER): Payer: Medicare Other | Admitting: Pulmonary Disease

## 2017-12-19 ENCOUNTER — Encounter: Payer: Self-pay | Admitting: Pulmonary Disease

## 2017-12-19 ENCOUNTER — Other Ambulatory Visit: Payer: Self-pay | Admitting: Student in an Organized Health Care Education/Training Program

## 2017-12-19 VITALS — BP 118/70 | HR 85 | Ht 70.0 in | Wt >= 6400 oz

## 2017-12-19 DIAGNOSIS — G4733 Obstructive sleep apnea (adult) (pediatric): Secondary | ICD-10-CM | POA: Diagnosis not present

## 2017-12-19 DIAGNOSIS — R0600 Dyspnea, unspecified: Secondary | ICD-10-CM

## 2017-12-19 DIAGNOSIS — E662 Morbid (severe) obesity with alveolar hypoventilation: Secondary | ICD-10-CM

## 2017-12-19 DIAGNOSIS — J449 Chronic obstructive pulmonary disease, unspecified: Secondary | ICD-10-CM

## 2017-12-19 DIAGNOSIS — R911 Solitary pulmonary nodule: Secondary | ICD-10-CM | POA: Diagnosis not present

## 2017-12-19 DIAGNOSIS — J9611 Chronic respiratory failure with hypoxia: Secondary | ICD-10-CM

## 2017-12-19 DIAGNOSIS — R918 Other nonspecific abnormal finding of lung field: Secondary | ICD-10-CM

## 2017-12-19 DIAGNOSIS — R0609 Other forms of dyspnea: Secondary | ICD-10-CM | POA: Diagnosis not present

## 2017-12-19 NOTE — Progress Notes (Signed)
Synopsis: Referred in November 2019 for COPD by [name], MD  Subjective:   PATIENT ID: [name] GENDER: female DOB: [birth year], MRN: [id number]  Chief Complaint  Patient presents with  . Consult    10 years since COPD dx, first time seeing pulmonologist. Recently got new cpap has a sleep consult in december. States her SOB is getting worse, while she is sitting nothing she is having exacerbations. States her heart rate is dropping with exertion.     PMH of OSA, OHS, on CPAP, Chronic respiratory failure on 2L day and night. Long time smoker past 40 years 1.5ppd. Recent CT scan with RUL and LUL lung nodule. She has trouble walking and talking at the same time. Currently just managed with albuterol inhaler. Also on trelegy inhaler. Planning to go on the 2nd for evaluation of lap-band surgery. Its a 6 month program that she is enrolling in. She does have allergies to seasons, outdoor and pollen.  Overall the patient has had significant shortness of breath with exertion or minimal activity.  She does understand that her weight plays a large role in this.  She has been trying to watch what she is eating.  She has been successful at quitting smoking within the past year.  She has a sleep study scheduled for evaluation of her OSA and getting a new machine and new mask.  She has daily cough, denies sputum production, denies recent exacerbation.  She has been told that she has COPD but has not had full PFTs before.  She does have chronic diastolic heart failure that is managed with diuretics.  She watches her fluid intake.  She has noticed some edema in her lower extremities.  Past Medical History:  Diagnosis Date  . Arthritis 04-10-11   hips, shoulders, back  . Asthma   . Bipolar disorder ([place])   . Cancer ([place]) 1993   cervical, no treatment done, went away per pt  . Cervical dysplasia or atypia 04-10-11   '93- once dx.-got pregnant-no intervention, then postpartum, no dysplasia found  .  CHF (congestive heart failure) ([place])    no cardiologist 2014 dx, none now  . 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 Pathology showed Condyloma, gluteal CONDYLOMA ACUMINATUM.   [name] Kitchen Dyspnea    with actity - better since taking Singular  . Fall 12/16/2016  . Fibrocystic breast disease   . Hypercalcemia   . Hyperlipidemia   . Hypertension   . Migraine headache    none recent  . Morbid obesity ([place]) 03/27/2006  . Neuropathy   . Skin lesion 03/15/2011   In gluteal crease now s/p removal by Dr. [name] of General Surgery on 3/12. Path shows condyloma.     . Sleep apnea 04-10-11   uses cpap, pt does not know settings  . Trigger finger    left third     Family History  Problem Relation Age of Onset  . Stroke Father   . Cancer Maternal Aunt        breast  . Breast cancer Maternal Aunt   . Asthma Other   . COPD Other   . Hypertension Other   . Diabetes Other      Past Surgical History:  Procedure Laterality Date  . ANAL FISTULECTOMY  04/17/2011   Procedure: FISTULECTOMY ANAL;  Surgeon: Imogene Burn. [name], MD;  Location: [facility];  Service: General;  Laterality: N/A;  Excision of Condyloma Gluteal Cleft   . BACK SURGERY  04-10-11   x5-Lumbar fusion-retained hardware.(Dr. Louanne Skye)  . BREAST CYST EXCISION     bilateral breast, 3 cysts removed from each breast  . BREAST EXCISIONAL BIOPSY Right    x 3  . BREAST EXCISIONAL BIOPSY Left    x 3  . CARPAL TUNNEL RELEASE Bilateral   . Cervical biospy    . COLONOSCOPY WITH PROPOFOL N/A 07/06/2015   Procedure: COLONOSCOPY WITH PROPOFOL;  Surgeon: [name], MD;  Location: [facility];  Service: Endoscopy;  Laterality: N/A;  . DOPPLER ECHOCARDIOGRAPHY  04/06/2012   AT [facility] 55-60%  . I&D EXTREMITY Left 12/18/2016   Procedure: IRRIGATION AND DEBRIDEMENT LEFT FOOT, CLOSURE;  Surgeon: [name], MD;  Location: [facility];  Service: Orthopedics;  Laterality: Left;  [name] Kitchen MULTIPLE TOOTH EXTRACTIONS    . NECK SURGERY   04-10-11   x3- cervical fusion with plating and screws-Dr. [name]  . TEE WITHOUT CARDIOVERSION N/A 04/06/2012   Procedure: TRANSESOPHAGEAL ECHOCARDIOGRAM (TEE);  Surgeon: [name], MD;  Location: [facility];  Service: Cardiovascular;  Laterality: N/A;  . TRIGGER FINGER RELEASE Right    middle finger  . TRIGGER FINGER RELEASE Left 06/28/2016   Procedure: RELEASE TRIGGER FINGER LEFT 3RD FINGER;  Surgeon: [name], MD;  Location: [facility];  Service: Orthopedics;  Laterality: Left;  . TRIGGER FINGER RELEASE Right 06/12/2016   Procedure: RIGHT INDEX FINGER TRIGGER RELEASE;  Surgeon: [name], MD;  Location: [facility];  Service: Orthopedics;  Laterality: Right;    Social History   Socioeconomic History  . Marital status: Divorced    Spouse name: Not on file  . Number of children: Not on file  . Years of education: Not on file  . Highest education level: Not on file  Occupational History  . Not on file  Social Needs  . Financial resource strain: Not on file  . Food insecurity:    Worry: Never true    Inability: Often true  . Transportation needs:    Medical: Not on file    Non-medical: Not on file  Tobacco Use  . Smoking status: Former Smoker    Packs/day: 1.00    Years: 39.00    Pack years: 39.00    Types: Cigarettes    Last attempt to quit: 09/07/2017    Years since quitting: 0.2  . Smokeless tobacco: Never Used  . Tobacco comment: Quit prior to hospitalization using low dose Chantix  Substance and Sexual Activity  . Alcohol use: No    Alcohol/week: 0.0 standard drinks  . Drug use: Not Currently    Types: Marijuana, "Crack" cocaine    Comment: hx marijuana usemany years ago, Crack -none since 11/2015  . Sexual activity: Yes    Partners: Male  Lifestyle  . Physical activity:    Days per week: Not on file    Minutes per session: Not on file  . Stress: Not on file  Relationships  . Social connections:    Talks on phone: Not on file    Gets together: Not on file      Attends religious service: Not on file    Active member of club or organization: Not on file    Attends meetings of clubs or organizations: Not on file    Relationship status: Not on file  . Intimate partner violence:    Fear of current or ex partner: Not on file    Emotionally abused: Not on file    Physically abused: Not  on file    Forced sexual activity: Not on file  Other Topics Concern  . Not on file  Social History Narrative   Current Social History 01/23/2017        Who lives at home: Patient lives alone in one level home 01/23/2017    Transportation: Patient has own vehicle and drives herself 10/93/2355   Important Relationships "Family and friends" 01/23/2017    Pets: None 01/23/2017   Education / Work:  12 th grade/ None 01/23/2017   Interests / Fun: "Play bingo on phone, watch TV, Be with family and friends, sit on my porch." 01/23/2017   Current Stressors: None 01/23/2017   Religious / Personal Beliefs: "God Jesus" 01/23/2017   Other: "I love everyone, I wake up with joy in my heart and go to sleep the same way. I'm a lovable person." 01/23/2017   L. Ducatte, RN, BSN                                                                                                   Allergies  Allergen Reactions  . Chantix [Varenicline Tartrate] Other (See Comments)    On the 1 mg dose, patient experienced hallucintation  . 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 Levofloxacin Itching     Outpatient Medications Prior to Visit  Medication Sig Dispense Refill  . ACCU-CHEK AVIVA PLUS test strip TEST 4 TIMES DAILY 100 each 12  . ACCU-CHEK SOFTCLIX LANCETS lancets Use as instructed 100 each 12  . albuterol (PROAIR HFA) 108 (90 Base) MCG/ACT inhaler USE 1-2 PUFFS INTO LUNGS EVERY 4 HOURS AS NEEDED FOR WHEEZING OR SHORTNESS OF BREATH. 8.5 Inhaler 0  . allopurinol (ZYLOPRIM) 100 MG tablet Take 100 mg by mouth daily.    [name] Kitchen ammonium lactate (AMLACTIN) 12 % lotion  Apply 1 application topically 2 (two) times daily as needed for dry skin. 400 g 0  . atorvastatin (LIPITOR) 40 MG tablet Take 1 tablet (40 mg total) daily by mouth. 90 tablet 3  . baclofen (LIORESAL) 10 MG tablet TAKE 2 TABLETS (20 MG TOTAL) BY MOUTH 3 (THREE) TIMES DAILY. 42 tablet 0  . blood glucose meter kit and supplies KIT Dispense based on patient and insurance preference. Use up to four times daily as directed. (FOR ICD-9 250.00, 250.01). 1 each 0  . busPIRone (BUSPAR) 10 MG tablet Take 1 tablet (10 mg total) by mouth 3 (three) times daily. 90 tablet 11  . Capsaicin-Menthol (PAIN RELIEF EX) Apply 1 application topically 3 (three) times daily as needed (shoulder pain).    . citalopram (CELEXA) 40 MG tablet Take 1 tablet (40 mg total) by mouth daily. 30 tablet 3  . diclofenac sodium (VOLTAREN) 1 % GEL APPLY 2 GRAMS TO AFFECTED AREA 4 TIMES A DAY 300 g 2  . docusate sodium (COLACE) 100 MG capsule Take 200 mg by mouth 2 (two) times daily.    . empagliflozin (JARDIANCE) 25 MG TABS tablet Take 25 mg by mouth daily. 90 tablet 0  . famotidine (PEPCID) 10 MG tablet  Take 1 tablet (10 mg total) by mouth daily. 30 tablet 0  . fluticasone (FLONASE) 50 MCG/ACT nasal spray USE 2 SPRAYS EACH NOSTRIL TWICE DAILY. 16 g 1  . furosemide (LASIX) 40 MG tablet Take 1 tablet (40 mg total) by mouth 2 (two) times daily. 60 tablet 0  . gabapentin (NEURONTIN) 300 MG capsule TAKE 2 CAPSULES (600 MG TOTAL) BY MOUTH 3 (THREE) TIMES DAILY. 540 capsule 2  . HYDROcodone-acetaminophen (NORCO) 7.5-325 MG tablet Take 1-2 tablets by mouth every 6 (six) hours as needed for moderate pain or severe pain. 50 tablet 0  . hydrOXYzine (VISTARIL) 100 MG capsule Take 1 capsule (100 mg total) by mouth 3 (three) times daily as needed for itching. 30 capsule 0  . Insulin Glargine (LANTUS SOLOSTAR) 100 UNIT/ML Solostar Pen Inject 10 Units into the skin daily. 15 mL 3  . insulin lispro (HUMALOG KWIKPEN) 100 UNIT/ML KiwkPen Inject 0.03 mLs (3  Units total) into the skin 3 (three) times daily before meals. 15 mL 2  . Insulin Pen Needle (B-D UF III MINI PEN NEEDLES) 31G X 5 MM MISC CHECK SUGARS 4 TIMES A DAY BEFORE MEALS AND AT BEDTIME. 100 each 12  . ketoconazole (NIZORAL) 2 % cream Apply 1 fingertip amount to each foot daily. (Patient taking differently: Apply 1 application topically 2 (two) times daily. Apply 1 fingertip amount to each foot TWICE DAILY) 30 g 0  . loratadine (CLARITIN) 10 MG tablet Take 1 tablet (10 mg total) by mouth daily. 90 tablet 0  . losartan (COZAAR) 100 MG tablet Take 1 tablet (100 mg total) by mouth daily. 90 tablet 3  . montelukast (SINGULAIR) 10 MG tablet Take 1 tablet (10 mg total) by mouth at bedtime. AS DIRECTED 90 tablet 3  . Multiple Vitamins-Minerals (MULTIVITAMIN WITH MINERALS) tablet Take 1 tablet by mouth daily.    . mupirocin ointment (BACTROBAN) 2 % Apply 1 application topically 2 (two) times daily. 22 g 0  . NON FORMULARY Uses a C-PAP at bedtime    . nystatin (MYCOSTATIN) 100000 UNIT/ML suspension Take 5 mLs (500,000 Units total) by mouth 4 (four) times daily. Swish and swallow. 60 mL 3  . omeprazole (PRILOSEC) 20 MG capsule Take 1 capsule (20 mg total) by mouth daily as needed. (Patient taking differently: Take 20 mg by mouth daily. ) 30 capsule 0  . oxybutynin (DITROPAN XL) 10 MG 24 hr tablet Take 1 tablet (10 mg total) by mouth at bedtime. 30 tablet 0  . TRELEGY ELLIPTA 100-62.5-25 MCG/INH AEPB INHALE 100 MCG INTO THE LUNGS DAILY. 60 each 2  . varenicline (CHANTIX) 0.5 MG tablet Take 1 tablet (0.5 mg total) by mouth 2 (two) times daily. One daily x 3 days BID 57 tablet 3  . Vitamin D, Ergocalciferol, (DRISDOL) 50000 units CAPS capsule Take 50,000 Units by mouth every 7 (seven) days. MONDAYS     Facility-Administered Medications Prior to Visit  Medication Dose Route Frequency Provider Last Rate Last Dose  . technetium tetrofosmin (TC-MYOVIEW) injection 63.8 millicurie  45.3 millicurie Intravenous  Once PRN Sueanne Margarita, MD        Review of Systems  Constitutional: Negative for chills, fever, malaise/fatigue and weight loss.  HENT: Negative for hearing loss, sore throat and tinnitus.   Eyes: Negative for blurred vision and double vision.  Respiratory: Positive for cough, sputum production and shortness of breath. Negative for hemoptysis and stridor.   Cardiovascular: Negative for chest pain, palpitations, orthopnea, leg swelling and PND.  Gastrointestinal: Negative for abdominal pain, constipation, diarrhea, heartburn, nausea and vomiting.  Genitourinary: Negative for dysuria, hematuria and urgency.  Musculoskeletal: Negative for joint pain and myalgias.  Skin: Negative for itching and rash.  Neurological: Negative for dizziness, tingling, weakness and headaches.  Endo/Heme/Allergies: Negative for environmental allergies. Does not bruise/bleed easily.  Psychiatric/Behavioral: Negative for depression. The patient is not nervous/anxious and does not have insomnia.   All other systems reviewed and are negative.   Objective:  Physical Exam  Constitutional: She is oriented to person, place, and time. She appears well-developed and well-nourished. No distress.  HENT:  Head: Normocephalic and atraumatic.  Mouth/Throat: Oropharynx is clear and moist.  Eyes: Pupils are equal, round, and reactive to light. Conjunctivae are normal. No scleral icterus.  Neck: Neck supple. No JVD present. No tracheal deviation present.  Cardiovascular: Normal rate, regular rhythm, normal heart sounds and intact distal pulses.  No murmur heard. Pulmonary/Chest: Effort normal. No accessory muscle usage or stridor. No tachypnea. She has no wheezes. She has no rhonchi. She has no rales.  Distant diminished breath sounds bilaterally.   Abdominal: Soft. Bowel sounds are normal. She exhibits no distension. There is no tenderness.  Obese pannus    Musculoskeletal: She exhibits no edema or tenderness.    Lymphadenopathy:    She has no cervical adenopathy.  Neurological: She is alert and oriented to person, place, and time.  Skin: Skin is warm and dry. Capillary refill takes less than 2 seconds. No rash noted.  Psychiatric: She has a normal mood and affect. Her behavior is normal.  Vitals reviewed.    Vitals:   12/19/17 0907  BP: 118/70  Pulse: 85  SpO2: 94%  Weight: (!) 410 lb (186 kg)  Height: _0  (1.778 m)   94% on RA BMI Readings from Last 3 Encounters:  12/19/17 58.83 kg/m  12/12/17 58.25 kg/m  12/08/17 58.18 kg/m   Wt Readings from Last 3 Encounters:  12/19/17 (!) 410 lb (186 kg)  12/12/17 (!) 406 lb (184.2 kg)  12/08/17 (!) 405 lb 8 oz (183.9 kg)     CBC    Component Value Date/Time   WBC 9.9 10/03/2017 0610   RBC 5.66 (H) 10/03/2017 0610   HGB 15.0 10/03/2017 0610   HGB 15.9 04/23/2017 1010   HCT 45.0 10/03/2017 0610   HCT 46.2 04/23/2017 1010   PLT 301 10/03/2017 0610   PLT 360 04/23/2017 1010   MCV 79.5 10/03/2017 0610   MCV 82 04/23/2017 1010   MCH 26.5 10/03/2017 0610   MCHC 33.3 10/03/2017 0610   RDW 15.8 (H) 10/03/2017 0610   RDW 13.9 04/23/2017 1010   LYMPHSABS 2.9 10/03/2017 0610   MONOABS 1.2 (H) 10/03/2017 0610   EOSABS 0.3 10/03/2017 0610   BASOSABS 0.0 10/03/2017 0610    Chest Imaging: September 2019 CT chest: Right upper lobe 6 mm lung nodule, irregular, 4 mm irregular dense nodule left upper lobe The patient's images have been independently reviewed by me.    Pulmonary Functions Testing Results: No flowsheet data found.  FeNO: None   Pathology: None  Echocardiogram:  Study Conclusions - Left ventricle: The cavity size was normal. Wall thickness was   normal. Systolic function was normal. The estimated ejection   fraction was in the range of 55% to 60%. Wall motion was normal;   there were no regional wall motion abnormalities. Doppler   parameters are consistent with abnormal left ventricular   relaxation (grade 1  diastolic dysfunction).  Heart Catheterization: None      Assessment & Plan:   DOE (dyspnea on exertion) - Plan: Pulmonary Function Test, Spirometry with graph  COPD (chronic obstructive pulmonary disease) with chronic bronchitis ([place]) - Plan: Pulmonary Function Test, Spirometry with graph  Nodule of upper lobe of right lung - Plan: Pulmonary Function Test, Spirometry with graph  Nodule of upper lobe of left lung - Plan: Pulmonary Function Test, Spirometry with graph  Obesity hypoventilation syndrome ([place]) - Plan: Pulmonary Function Test, Spirometry with graph  OSA (obstructive sleep apnea) - Plan: Pulmonary Function Test, Spirometry with graph  Abnormal findings on diagnostic imaging of lung - Plan: CT CHEST WO CONTRAST, Pulmonary Function Test, Spirometry with graph  Chronic respiratory failure with hypoxia (H[facility])  Discussion:  This a 52 year old female with morbid obesity, likely OHS, OSA on CPAP awaiting new study for new mask.  She has significant dyspnea on exertion and has chronic hypoxemic respiratory failure requiring 2 L nasal cannula O2.  Today we discussed weight loss as well as diet management. We will also obtain full PFTs which I suspect will have restrictive lung disease due to her weight however due to her significant smoking history she may have evidence of obstruction consistent with COPD.  She does have pulmonary nodules within the right upper lobe and left upper lobe.  Due to her high risk of smoking she will need CT follow-up.  We will schedule this as well.  Pending on her PFTs she may benefit from enrollment in pulmonary rehab.  I do not believe she is a candidate for cardiac rehab as she has grade 1 diastolic dysfunction with a normal EF on prior echo.  We will get an in office spirometry today  Patient was counseled on continued smoking cessation  As for now she can continue her Trelegy inhaler as well as albuterol as needed for shortness of breath and  wheezing. Continue Flonase, continue antihistamine continue vitamin D supplement Continue Singulair  Return to clinic in 6 to 8 weeks  Greater than 50% of this patient's 60-minute office visit was spent face-to-face discussing the above recommendations and treatment plan.   Current Outpatient Medications:  .  ACCU-CHEK AVIVA PLUS test strip, TEST 4 TIMES DAILY, Disp: 100 each, Rfl: 12 .  ACCU-CHEK SOFTCLIX LANCETS lancets, Use as instructed, Disp: 100 each, Rfl: 12 .  albuterol (PROAIR HFA) 108 (90 Base) MCG/ACT inhaler, USE 1-2 PUFFS INTO LUNGS EVERY 4 HOURS AS NEEDED FOR WHEEZING OR SHORTNESS OF BREATH., Disp: 8.5 Inhaler, Rfl: 0 .  allopurinol (ZYLOPRIM) 100 MG tablet, Take 100 mg by mouth daily., Disp: , Rfl:  .  ammonium lactate (AMLACTIN) 12 % lotion, Apply 1 application topically 2 (two) times daily as needed for dry skin., Disp: 400 g, Rfl: 0 .  atorvastatin (LIPITOR) 40 MG tablet, Take 1 tablet (40 mg total) daily by mouth., Disp: 90 tablet, Rfl: 3 .  baclofen (LIORESAL) 10 MG tablet, TAKE 2 TABLETS (20 MG TOTAL) BY MOUTH 3 (THREE) TIMES DAILY., Disp: 42 tablet, Rfl: 0 .  blood glucose meter kit and supplies KIT, Dispense based on patient and insurance preference. Use up to four times daily as directed. (FOR ICD-9 250.00, 250.01)., Disp: 1 each, Rfl: 0 .  busPIRone (BUSPAR) 10 MG tablet, Take 1 tablet (10 mg total) by mouth 3 (three) times daily., Disp: 90 tablet, Rfl: 11 .  Capsaicin-Menthol (PAIN RELIEF EX), Apply 1 application topically 3 (three) times daily as needed (shoulder pain)., Disp: , Rfl:  .  citalopram (CELEXA) 40 MG tablet, Take 1 tablet (40 mg total) by mouth daily., Disp: 30 tablet, Rfl: 3 .  diclofenac sodium (VOLTAREN) 1 % GEL, APPLY 2 GRAMS TO AFFECTED AREA 4 TIMES A DAY, Disp: 300 g, Rfl: 2 .  docusate sodium (COLACE) 100 MG capsule, Take 200 mg by mouth 2 (two) times daily., Disp: , Rfl:  .  empagliflozin (JARDIANCE) 25 MG TABS tablet, Take 25 mg by mouth daily.,  Disp: 90 tablet, Rfl: 0 .  famotidine (PEPCID) 10 MG tablet, Take 1 tablet (10 mg total) by mouth daily., Disp: 30 tablet, Rfl: 0 .  fluticasone (FLONASE) 50 MCG/ACT nasal spray, USE 2 SPRAYS EACH NOSTRIL TWICE DAILY., Disp: 16 g, Rfl: 1 .  furosemide (LASIX) 40 MG tablet, Take 1 tablet (40 mg total) by mouth 2 (two) times daily., Disp: 60 tablet, Rfl: 0 .  gabapentin (NEURONTIN) 300 MG capsule, TAKE 2 CAPSULES (600 MG TOTAL) BY MOUTH 3 (THREE) TIMES DAILY., Disp: 540 capsule, Rfl: 2 .  HYDROcodone-acetaminophen (NORCO) 7.5-325 MG tablet, Take 1-2 tablets by mouth every 6 (six) hours as needed for moderate pain or severe pain., Disp: 50 tablet, Rfl: 0 .  hydrOXYzine (VISTARIL) 100 MG capsule, Take 1 capsule (100 mg total) by mouth 3 (three) times daily as needed for itching., Disp: 30 capsule, Rfl: 0 .  Insulin Glargine (LANTUS SOLOSTAR) 100 UNIT/ML Solostar Pen, Inject 10 Units into the skin daily., Disp: 15 mL, Rfl: 3 .  insulin lispro (HUMALOG KWIKPEN) 100 UNIT/ML KiwkPen, Inject 0.03 mLs (3 Units total) into the skin 3 (three) times daily before meals., Disp: 15 mL, Rfl: 2 .  Insulin Pen Needle (B-D UF III MINI PEN NEEDLES) 31G X 5 MM MISC, CHECK SUGARS 4 TIMES A DAY BEFORE MEALS AND AT BEDTIME., Disp: 100 each, Rfl: 12 .  ketoconazole (NIZORAL) 2 % cream, Apply 1 fingertip amount to each foot daily. (Patient taking differently: Apply 1 application topically 2 (two) times daily. Apply 1 fingertip amount to each foot TWICE DAILY), Disp: 30 g, Rfl: 0 .  loratadine (CLARITIN) 10 MG tablet, Take 1 tablet (10 mg total) by mouth daily., Disp: 90 tablet, Rfl: 0 .  losartan (COZAAR) 100 MG tablet, Take 1 tablet (100 mg total) by mouth daily., Disp: 90 tablet, Rfl: 3 .  montelukast (SINGULAIR) 10 MG tablet, Take 1 tablet (10 mg total) by mouth at bedtime. AS DIRECTED, Disp: 90 tablet, Rfl: 3 .  Multiple Vitamins-Minerals (MULTIVITAMIN WITH MINERALS) tablet, Take 1 tablet by mouth daily., Disp: , Rfl:  .   mupirocin ointment (BACTROBAN) 2 %, Apply 1 application topically 2 (two) times daily., Disp: 22 g, Rfl: 0 .  NON FORMULARY, Uses a C-PAP at bedtime, Disp: , Rfl:  .  nystatin (MYCOSTATIN) 100000 UNIT/ML suspension, Take 5 mLs (500,000 Units total) by mouth 4 (four) times daily. Swish and swallow., Disp: 60 mL, Rfl: 3 .  omeprazole (PRILOSEC) 20 MG capsule, Take 1 capsule (20 mg total) by mouth daily as needed. (Patient taking differently: Take 20 mg by mouth daily. ), Disp: 30 capsule, Rfl: 0 .  oxybutynin (DITROPAN XL) 10 MG 24 hr tablet, Take 1 tablet (10 mg total) by mouth at bedtime., Disp: 30 tablet, Rfl: 0 .  TRELEGY ELLIPTA 100-62.5-25 MCG/INH AEPB, INHALE 100 MCG INTO THE LUNGS DAILY., Disp: 60 each, Rfl: 2 .  varenicline (CHANTIX) 0.5 MG tablet, Take 1 tablet (0.5 mg total) by mouth 2 (two) times daily. One daily x 3 days BID, Disp:  57 tablet, Rfl: 3 .  Vitamin D, Ergocalciferol, (DRISDOL) 50000 units CAPS capsule, Take 50,000 Units by mouth every 7 (seven) days. MONDAYS, Disp: , Rfl:  No current facility-administered medications for this visit.   Facility-Administered Medications Ordered in Other Visits:  .  technetium tetrofosmin (TC-MYOVIEW) injection 19.5 millicurie, 09.3 millicurie, Intravenous, Once PRN, Radford Pax, Eber Hong, MD   Garner Nash, DO [facility] 12/19/2017 9:42 AM

## 2017-12-19 NOTE — Patient Instructions (Addendum)
Thank you for visiting Dr. Valeta Harms at [facility]. Today we recommend the following:  Orders Placed This Encounter  Procedures  . CT CHEST WO CONTRAST  . Pulmonary Function Test  . Spirometry with graph   Please continue your current inhaler regimen.   Return in about 8 weeks (around 02/13/2018).

## 2017-12-22 ENCOUNTER — Encounter: Payer: Self-pay | Admitting: Student in an Organized Health Care Education/Training Program

## 2017-12-22 ENCOUNTER — Telehealth: Payer: Self-pay

## 2017-12-22 MED ORDER — DILTIAZEM HCL ER COATED BEADS 120 MG PO CP24: 120.0000 mg | ORAL_CAPSULE | Freq: Every day | ORAL | 3 refills | Status: DC

## 2017-12-22 NOTE — Telephone Encounter (Signed)
-----   Message from [name], MD sent at 12/21/2017 12:55 PM EST ----- Monitor showed frequent occurrences of fast heart rates. We can't use one class of medications to control these given your breathing issues, but we can use another type of medication. I recommend trying diltiazem extended release 120 mg once daily to see if this decreases the frequency of these SVT events.

## 2017-12-22 NOTE — Telephone Encounter (Signed)
Pt updated with monitor results along with Dr. Judeth Cornfield recommendation. Pt verbalized understanding. New orders placed.

## 2017-12-24 ENCOUNTER — Other Ambulatory Visit (INDEPENDENT_AMBULATORY_CARE_PROVIDER_SITE_OTHER): Payer: Self-pay | Admitting: Radiology

## 2017-12-24 ENCOUNTER — Encounter (INDEPENDENT_AMBULATORY_CARE_PROVIDER_SITE_OTHER): Payer: Self-pay | Admitting: Specialist

## 2017-12-24 ENCOUNTER — Other Ambulatory Visit: Payer: Self-pay | Admitting: Student in an Organized Health Care Education/Training Program

## 2017-12-24 ENCOUNTER — Ambulatory Visit (INDEPENDENT_AMBULATORY_CARE_PROVIDER_SITE_OTHER): Payer: Medicare Other | Admitting: Specialist

## 2017-12-24 ENCOUNTER — Telehealth: Payer: Self-pay | Admitting: Family Medicine

## 2017-12-24 VITALS — BP 167/76 | HR 66 | Ht 70.0 in | Wt >= 6400 oz

## 2017-12-24 DIAGNOSIS — E1122 Type 2 diabetes mellitus with diabetic chronic kidney disease: Secondary | ICD-10-CM

## 2017-12-24 DIAGNOSIS — Z5321 Procedure and treatment not carried out due to patient leaving prior to being seen by health care provider: Secondary | ICD-10-CM

## 2017-12-24 DIAGNOSIS — Z794 Long term (current) use of insulin: Secondary | ICD-10-CM

## 2017-12-24 DIAGNOSIS — N183 Chronic kidney disease, stage 3 (moderate): Secondary | ICD-10-CM

## 2017-12-24 DIAGNOSIS — F418 Other specified anxiety disorders: Secondary | ICD-10-CM

## 2017-12-24 NOTE — Telephone Encounter (Signed)
Found the form in the bottom of Dr. Ernestina Penna box in an envelope with pt name on it.  Placed form in Dr. Toni Amend box since she is covering Dr. Ernestina Penna box. Katharina Caper, Tymia Streb D, [place]

## 2017-12-24 NOTE — Telephone Encounter (Signed)
Covering for Dr. Burr Medico, will refill celexa.

## 2017-12-24 NOTE — Telephone Encounter (Signed)
Patient was not able to wait today due to another appt @ 12pm, She is requesting a refill on her pain meds.

## 2017-12-24 NOTE — Telephone Encounter (Signed)
White team, could you call this patient and see what she says about the form and where she left the form? She messaged Dr. Ernestina Penna through the portal and said that she left a form regarding her utilities, but I've looked in my paper inbox, Dr. Ernestina Penna paper inbox, and the RN box and I can't find any forms anywhere.

## 2017-12-26 ENCOUNTER — Encounter (INDEPENDENT_AMBULATORY_CARE_PROVIDER_SITE_OTHER): Payer: Self-pay | Admitting: Specialist

## 2017-12-27 ENCOUNTER — Other Ambulatory Visit: Payer: Self-pay | Admitting: Student in an Organized Health Care Education/Training Program

## 2017-12-27 DIAGNOSIS — E8770 Fluid overload, unspecified: Secondary | ICD-10-CM

## 2017-12-27 DIAGNOSIS — R600 Localized edema: Secondary | ICD-10-CM

## 2017-12-27 DIAGNOSIS — R0789 Other chest pain: Secondary | ICD-10-CM

## 2017-12-28 ENCOUNTER — Other Ambulatory Visit (INDEPENDENT_AMBULATORY_CARE_PROVIDER_SITE_OTHER): Payer: Self-pay | Admitting: Specialist

## 2017-12-29 ENCOUNTER — Other Ambulatory Visit: Payer: Self-pay

## 2017-12-29 ENCOUNTER — Encounter (INDEPENDENT_AMBULATORY_CARE_PROVIDER_SITE_OTHER): Payer: Self-pay | Admitting: Specialist

## 2017-12-29 DIAGNOSIS — I509 Heart failure, unspecified: Secondary | ICD-10-CM | POA: Diagnosis not present

## 2017-12-29 DIAGNOSIS — I10 Essential (primary) hypertension: Secondary | ICD-10-CM | POA: Diagnosis not present

## 2017-12-29 DIAGNOSIS — E119 Type 2 diabetes mellitus without complications: Secondary | ICD-10-CM | POA: Diagnosis not present

## 2017-12-29 NOTE — Telephone Encounter (Signed)
Baclofen refill request 

## 2017-12-30 ENCOUNTER — Encounter (INDEPENDENT_AMBULATORY_CARE_PROVIDER_SITE_OTHER): Payer: Self-pay | Admitting: Specialist

## 2017-12-30 ENCOUNTER — Encounter: Payer: Self-pay | Admitting: Neurology

## 2017-12-30 ENCOUNTER — Ambulatory Visit (INDEPENDENT_AMBULATORY_CARE_PROVIDER_SITE_OTHER): Payer: Medicare Other | Admitting: Neurology

## 2017-12-30 VITALS — BP 183/104 | HR 101 | Ht 70.0 in | Wt >= 6400 oz

## 2017-12-30 DIAGNOSIS — Z8679 Personal history of other diseases of the circulatory system: Secondary | ICD-10-CM | POA: Diagnosis not present

## 2017-12-30 DIAGNOSIS — G4733 Obstructive sleep apnea (adult) (pediatric): Secondary | ICD-10-CM | POA: Diagnosis not present

## 2017-12-30 DIAGNOSIS — Z6841 Body Mass Index (BMI) 40.0 and over, adult: Secondary | ICD-10-CM

## 2017-12-30 DIAGNOSIS — Z9981 Dependence on supplemental oxygen: Secondary | ICD-10-CM

## 2017-12-30 DIAGNOSIS — J449 Chronic obstructive pulmonary disease, unspecified: Secondary | ICD-10-CM

## 2017-12-30 NOTE — Progress Notes (Signed)
Patient left without being seen.

## 2017-12-30 NOTE — Patient Instructions (Signed)

## 2017-12-30 NOTE — Progress Notes (Signed)
Subjective:    Patient ID: [name] is a 52 y.o. female.  HPI     Star Age, MD, PhD [facility] [address] [place], [place] [address]  Dear Dr. Owens Shark,   I saw your patient, [name], upon your kind request in my sleep clinic today for initial consultation of her sleep disorder, in particular, reevaluation of her obstructive sleep apnea. The patient is unaccompanied today. As you know, [name] is a 52 year old right-handed woman with an underlying medical complex medical history of COPD, congestive heart failure with oxygen dependence, urinary incontinence, diabetes, neuropathy, hypertension, hyperlipidemia, status post multiple surgeries, and morbid obesity with BMI of over 55, who was previously diagnosed with obstructive sleep apnea and placed on CPAP therapy. Prior sleep study results were reviewed today from 02/21/2008. Study was interpreted by Dr. Annamaria Boots. She had a prior baseline sleep study in August 2009. She had a CPAP titration study was titrated to a pressure of 18 cm and a treatment pressure of 15 cm was recommended. I reviewed her CPAP compliance data from 09/09/2017 through 12/07/2017 which is a total of 90 days, during which time she used her machine 63 days with percent used days greater than 4 hours at 42% only, average usage of 4 hours and 30 minutes, residual AHI 2.8 per hour, 95th percentile of pressure at 9.5 cm, she is on AutoPap and leak is high. She has not used her machine since approximately 11/22/2017. She reports that she has a problem with cockroaches at home and is getting help for this. (She had unfortunately cockroaches around the PAP machine and in the bag. We had to reschedule her appointment from 12/08/17). I reviewed your office note from 10/07/17. Her Epworth sleepiness score is 15 out of 24, fatigue score is 39/63. Her DME company is [facility] [employer]. She is single and lives alone, she has two  children. 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 She is currently using oxygen 2 L/m during the day but not at night when she uses her CPAP. She has stopped using her CPAP as she no longer has supplies. She did get a new CPAP machine about 2-3 months ago from her DME company. She denies morning headaches, has nocturia about 2-3 times per average night, typically in bed by 8 but watches TV while in bed and does not follow asleep until 11 or midnight. Rise time is around 7. When she was able to use her CPAP consistently she felt improved as far as her daytime energy and sleep quality. She has been told recently by her son and her sister that she has pauses in her breathing while asleep. She has been on supplemental oxygen during the day since October 2019. She has recently been using a small fullface mask but feels that she needs a medium size. Her air leak from the mask is indeed quite high.  Her Past Medical History Is Significant For: Past Medical History:  Diagnosis Date  . Arthritis 04-10-11   hips, shoulders, back  . Asthma   . Bipolar disorder ([place])   . Cancer ([place]) 1993   cervical, no treatment done, went away per pt  . Cervical dysplasia or atypia 04-10-11   '93- once dx.-got pregnant-no intervention, then postpartum, no dysplasia found  . CHF (congestive heart failure) ([place])    no cardiologist 2014 dx, none now  .  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 Pathology showed Condyloma, gluteal CONDYLOMA ACUMINATUM.   [name] Kitchen Dyspnea    with actity - better since taking Singular  . Fall 12/16/2016  . Fibrocystic breast disease   . Hypercalcemia   . Hyperlipidemia   . Hypertension   . Migraine headache    none recent  . Morbid obesity ([place]) 03/27/2006  . Neuropathy   . Skin lesion 03/15/2011   In gluteal crease now s/p removal by Dr. [name] of General Surgery on 3/12. Path shows  condyloma.     . Sleep apnea 04-10-11   uses cpap, pt does not know settings  . Trigger finger    left third    Her Past Surgical History Is Significant For: Past Surgical History:  Procedure Laterality Date  . ANAL FISTULECTOMY  04/17/2011   Procedure: FISTULECTOMY ANAL;  Surgeon: Imogene Burn. [name], MD;  Location: [facility];  Service: General;  Laterality: N/A;  Excision of Condyloma Gluteal Cleft   . BACK SURGERY  04-10-11   x5-Lumbar fusion-retained hardware.(Dr. Louanne Skye)  . BREAST CYST EXCISION     bilateral breast, 3 cysts removed from each breast  . BREAST EXCISIONAL BIOPSY Right    x 3  . BREAST EXCISIONAL BIOPSY Left    x 3  . CARPAL TUNNEL RELEASE Bilateral   . Cervical biospy    . COLONOSCOPY WITH PROPOFOL N/A 07/06/2015   Procedure: COLONOSCOPY WITH PROPOFOL;  Surgeon: [name], MD;  Location: [facility];  Service: Endoscopy;  Laterality: N/A;  . DOPPLER ECHOCARDIOGRAPHY  04/06/2012   AT [place] 55-60%  . I&D EXTREMITY Left 12/18/2016   Procedure: IRRIGATION AND DEBRIDEMENT LEFT FOOT, CLOSURE;  Surgeon: [name], MD;  Location: [place];  Service: Orthopedics;  Laterality: Left;  [name] Kitchen MULTIPLE TOOTH EXTRACTIONS    . NECK SURGERY  04-10-11   x3- cervical fusion with plating and screws-Dr. [name]  . TEE WITHOUT CARDIOVERSION N/A 04/06/2012   Procedure: TRANSESOPHAGEAL ECHOCARDIOGRAM (TEE);  Surgeon: [name], MD;  Location: [facility];  Service: Cardiovascular;  Laterality: N/A;  . TRIGGER FINGER RELEASE Right    middle finger  . TRIGGER FINGER RELEASE Left 06/28/2016   Procedure: RELEASE TRIGGER FINGER LEFT 3RD FINGER;  Surgeon: [name], MD;  Location: [facility];  Service: Orthopedics;  Laterality: Left;  . TRIGGER FINGER RELEASE Right 06/12/2016   Procedure: RIGHT INDEX FINGER TRIGGER RELEASE;  Surgeon: [name], MD;  Location: [place];  Service: Orthopedics;  Laterality: Right;    Her Family History Is Significant For: Family History  Problem Relation  Age of Onset  . Stroke Father   . Cancer Maternal Aunt        breast  . Breast cancer Maternal Aunt   . Asthma Other   . COPD Other   . Hypertension Other   . Diabetes Other     Her Social History Is Significant For: Social History   Socioeconomic History  . Marital status: Divorced    Spouse name: Not on file  . Number of children: Not on file  . Years of education: Not on file  . Highest education level: Not on file  Occupational History  . Not on file  Social Needs  . Financial resource strain: Not on file  . Food insecurity:    Worry: Never true    Inability: Often true  . Transportation needs:    Medical: Not on file  Non-medical: Not on file  Tobacco Use  . Smoking status: Former Smoker    Packs/day: 1.00    Years: 39.00    Pack years: 39.00    Types: Cigarettes    Last attempt to quit: 09/07/2017    Years since quitting: 0.3  . Smokeless tobacco: Never Used  . Tobacco comment: Quit prior to hospitalization using low dose Chantix  Substance and Sexual Activity  . Alcohol use: No    Alcohol/week: 0.0 standard drinks  . Drug use: Not Currently    Types: Marijuana, "Crack" cocaine    Comment: hx marijuana usemany years ago, Crack -none since 11/2015  . Sexual activity: Yes    Partners: Male  Lifestyle  . Physical activity:    Days per week: Not on file    Minutes per session: Not on file  . Stress: Not on file  Relationships  . Social connections:    Talks on phone: Not on file    Gets together: Not on file    Attends religious service: Not on file    Active member of club or organization: Not on file    Attends meetings of clubs or organizations: Not on file    Relationship status: Not on file  Other Topics Concern  . Not on file  Social History Narrative   Current Social History 01/23/2017        Who lives at home: Patient lives alone in one level home 01/23/2017    Transportation: Patient has own vehicle and drives herself 74/94/4967    Important Relationships "Family and friends" 01/23/2017    Pets: None 01/23/2017   Education / Work:  12 th grade/ None 01/23/2017   Interests / Fun: "Play bingo on phone, watch TV, Be with family and friends, sit on my porch." 01/23/2017   Current Stressors: None 01/23/2017   Religious / Personal Beliefs: "God Jesus" 01/23/2017   Other: "I love everyone, I wake up with joy in my heart and go to sleep the same way. I'm a lovable person." 01/23/2017   L. Ducatte, RN, BSN                                                                                                  Her Allergies Are:  Allergies  Allergen Reactions  . Chantix [Varenicline Tartrate] Other (See Comments)    On the 1 mg dose, patient experienced hallucintation  . 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 Levofloxacin Itching  :   Her Current Medications Are:  Outpatient Encounter Medications as of 12/30/2017  Medication Sig  . ACCU-CHEK AVIVA PLUS test strip TEST 4 TIMES DAILY  . ACCU-CHEK SOFTCLIX LANCETS lancets Use as instructed  . albuterol (PROAIR HFA) 108 (90 Base) MCG/ACT inhaler USE 1-2 PUFFS INTO LUNGS EVERY 4 HOURS AS NEEDED FOR WHEEZING OR SHORTNESS OF BREATH.  [name] Kitchen allopurinol (ZYLOPRIM) 100 MG tablet Take 100 mg by mouth daily.  [name] Kitchen ammonium lactate (AMLACTIN) 12 % lotion Apply 1 application topically 2 (two) times daily as needed for dry skin.  [name] Kitchen  atorvastatin (LIPITOR) 40 MG tablet Take 1 tablet (40 mg total) daily by mouth.  . baclofen (LIORESAL) 10 MG tablet TAKE 2 TABLETS (20 MG TOTAL) BY MOUTH 3 (THREE) TIMES DAILY.  . blood glucose meter kit and supplies KIT Dispense based on patient and insurance preference. Use up to four times daily as directed. (FOR ICD-9 250.00, 250.01).  . busPIRone (BUSPAR) 10 MG tablet Take 1 tablet (10 mg total) by mouth 3 (three) times daily.  . Capsaicin-Menthol (PAIN RELIEF EX) Apply 1 application topically 3 (three) times daily as needed (shoulder pain).   . citalopram (CELEXA) 40 MG tablet TAKE 1 TABLET BY MOUTH EVERY DAY  . diclofenac sodium (VOLTAREN) 1 % GEL APPLY 2 GRAMS TO AFFECTED AREA 4 TIMES A DAY  . diltiazem (CARDIZEM CD) 120 MG 24 hr capsule Take 1 capsule (120 mg total) by mouth daily.  [name] Kitchen docusate sodium (COLACE) 100 MG capsule Take 200 mg by mouth 2 (two) times daily.  . empagliflozin (JARDIANCE) 25 MG TABS tablet Take 25 mg by mouth daily.  . famotidine (PEPCID) 10 MG tablet Take 1 tablet (10 mg total) by mouth daily.  . fluticasone (FLONASE) 50 MCG/ACT nasal spray USE 2 SPRAYS EACH NOSTRIL TWICE DAILY.  . furosemide (LASIX) 40 MG tablet TAKE 1 TABLET BY MOUTH TWICE A DAY  . gabapentin (NEURONTIN) 300 MG capsule TAKE 2 CAPSULES (600 MG TOTAL) BY MOUTH 3 (THREE) TIMES DAILY.  [name] Kitchen HYDROcodone-acetaminophen (NORCO) 7.5-325 MG tablet Take 1-2 tablets by mouth every 6 (six) hours as needed for moderate pain or severe pain.  . hydrOXYzine (VISTARIL) 100 MG capsule Take 1 capsule (100 mg total) by mouth 3 (three) times daily as needed for itching.  . Insulin Glargine (LANTUS SOLOSTAR) 100 UNIT/ML Solostar Pen Inject 10 Units into the skin daily.  . insulin lispro (HUMALOG KWIKPEN) 100 UNIT/ML KiwkPen Inject 0.03 mLs (3 Units total) into the skin 3 (three) times daily before meals.  . Insulin Pen Needle (B-D UF III MINI PEN NEEDLES) 31G X 5 MM MISC CHECK SUGARS 4 TIMES A DAY BEFORE MEALS AND AT BEDTIME.  [name] Kitchen ketoconazole (NIZORAL) 2 % cream Apply 1 fingertip amount to each foot daily. (Patient taking differently: Apply 1 application topically 2 (two) times daily. Apply 1 fingertip amount to each foot TWICE DAILY)  . loratadine (CLARITIN) 10 MG tablet Take 1 tablet (10 mg total) by mouth daily.  [name] Kitchen losartan (COZAAR) 100 MG tablet Take 1 tablet (100 mg total) by mouth daily.  . montelukast (SINGULAIR) 10 MG tablet Take 1 tablet (10 mg total) by mouth at bedtime. AS DIRECTED  . Multiple Vitamins-Minerals (MULTIVITAMIN WITH MINERALS) tablet Take 1  tablet by mouth daily.  . mupirocin ointment (BACTROBAN) 2 % Apply 1 application topically 2 (two) times daily.  . NON FORMULARY Uses a C-PAP at bedtime  . nystatin (MYCOSTATIN) 100000 UNIT/ML suspension Take 5 mLs (500,000 Units total) by mouth 4 (four) times daily. Swish and swallow.  [name] Kitchen omeprazole (PRILOSEC) 20 MG capsule Take 1 capsule (20 mg total) by mouth daily as needed. (Patient taking differently: Take 20 mg by mouth daily. )  . oxybutynin (DITROPAN XL) 10 MG 24 hr tablet Take 1 tablet (10 mg total) by mouth at bedtime.  . TRELEGY ELLIPTA 100-62.5-25 MCG/INH AEPB INHALE 100 MCG INTO THE LUNGS DAILY.  [name] Kitchen varenicline (CHANTIX) 0.5 MG tablet Take 1 tablet (0.5 mg total) by mouth 2 (two) times daily. One daily x 3 days BID  . Vitamin D, Ergocalciferol, (  DRISDOL) 50000 units CAPS capsule Take 50,000 Units by mouth every 7 (seven) days. MONDAYS  . [DISCONTINUED] sucralfate (CARAFATE) 1 G tablet Take 1 tablet (1 g total) by mouth daily.   Facility-Administered Encounter Medications as of 12/30/2017  Medication  . technetium tetrofosmin (TC-MYOVIEW) injection 94.3 millicurie  :  Review of Systems:  Out of a complete 14 point review of systems, all are reviewed and negative with the exception of these symptoms as listed below: Review of Systems  Neurological:       Pt presents today to discuss her sleep. Pt has had a sleep study in the past and uses a cpap. Pt needs new cpap supplies. Pt uses [facility]. Pt says that she needs a new sleep study.   Epworth Sleepiness Scale 0= would never doze 1= slight chance of dozing 2= moderate chance of dozing 3= high chance of dozing  Sitting and reading: 3 Watching TV: 3 Sitting inactive in a public place (ex. Theater or meeting): 0 As a passenger in a car for an hour without a break: 0 Lying down to rest in the afternoon: 3 Sitting and talking to someone: 3 Sitting quietly after lunch (no alcohol): 3 In a car, while stopped in traffic: 0 Total: 15      Objective:  Neurological Exam  Physical Exam Physical Examination:   Vitals:   12/30/17 1434  BP: (!) 183/104  Pulse: (!) 101    General Examination: The patient is a very pleasant 52 y.o. female in no acute distress. She appears well-developed and well-nourished and adequately groomed. 02 at 2 lpm via [place].   HEENT: Normocephalic, atraumatic, pupils are equal, round and reactive to light and accommodation. Extraocular tracking is good without limitation to gaze excursion or nystagmus noted. Normal smooth pursuit is noted. Hearing is grossly intact. Face is symmetric with normal facial animation and normal facial sensation. Speech is clear with no dysarthria noted. There is no hypophonia. There is no lip, neck/head, jaw or voice tremor. Neck is supple with full range of passive and active motion. There are no carotid bruits on auscultation. Oropharynx exam reveals: moderate mouth dryness, adequate dental hygienewith dentures on top and only a few front teeth left on the bottom, she has a partial denture but does not wear it currently. She has moderate airway crowding secondary to redundant soft palate and tonsils in place of about 1+. Mallampati is class II. Tongue protrudes centrally and palate elevates symmetrically. Neck circumference is 18-5/8 inches.  Chest: Clear to auscultation without wheezing, rhonchi or crackles noted.  Heart: S1+S2+0, regular and normal without murmurs, rubs or gallops noted.   Abdomen: Soft, non-tender and non-distended with normal bowel sounds appreciated on auscultation.  Extremities: There is trace pitting edema in the distal lower extremities bilaterally.   Skin: Warm and dry without trophic changes noted.  Musculoskeletal: exam reveals no obvious joint deformities, tenderness or joint swelling or erythema.   Neurologically:  Mental status: The patient is awake, alert and oriented in all 4 spheres. Her immediate and remote memory, attention, language  skills and fund of knowledge are appropriate. There is no evidence of aphasia, agnosia, apraxia or anomia. Speech is clear with normal prosody and enunciation. Thought process is linear. Mood is normal and affect is normal.  Cranial nerves II - XII are as described above under HEENT exam. In addition: shoulder shrug is normal with equal shoulder height noted. Motor exam: Normal bulk, strength and tone is noted. There is no tremor. Fine  motor skills and coordination: grossly intact.  Cerebellar testing: No dysmetria or intention tremor on finger to nose testing. Heel to shin is unremarkable bilaterally. There is no truncal or gait ataxia.  Sensory exam: intact to light touch in the upper and lower extremities.  Gait, station and balance: She stands with mild difficulty and stands slightly wide-based. She walks without a walking aid.   Assessment and Plan:    In summary, AALIA GREULICH is a very pleasant 52 y.o.-year old female with an underlying medical complex medical history of COPD, congestive heart failure with oxygen dependence, urinary incontinence, diabetes, neuropathy, hypertension, hyperlipidemia, status post multiple surgeries, and morbid obesity with BMI of over 55, who presents for evaluation of her prior diagnosis of obstructive sleep apnea. She has been on AutoPap therapy, she has a relatively new machine but needs updated supplies. Has not been able to get supplies from her DME company. She had sleep study testing nearly 10 years ago. She would benefit from reevaluation with an updated sleep study. She is agreeable. To that end, I will order sleep study testing and we will call her with her test results and hopefully updated her treatment settings and supplies after that.  I had a long chat with the patient about my findings and the diagnosis of OSA, its prognosis and treatment options. We talked about medical treatments, surgical interventions and non-pharmacological approaches. I explained  in particular the risks and ramifications of untreated moderate to severe OSA, especially with respect to developing cardiovascular disease down the Road, including congestive heart failure, difficult to treat hypertension, cardiac arrhythmias, or stroke. Even type 2 diabetes has, in part, been linked to untreated OSA. Symptoms of untreated OSA include daytime sleepiness, memory problems, mood irritability and mood disorder such as depression and anxiety, lack of energy, as well as recurrent headaches, especially morning headaches. We talked about the Importance of ongoing smoking cessation and trying to maintain a healthy lifestyle in general, as well as the importance of weight control. I encouraged the patient to eat healthy, exercise daily and keep well hydrated, to keep a scheduled bedtime and wake time routine, to not skip any meals and eat healthy snacks in between meals. I advised the patient not to drive when feeling sleepy. I recommended the following at this time: sleep study with potential positive airway pressure titration. (We will score hypopneas at 4%).  I explained the importance of being compliant with PAP treatment, not only for insurance purposes but primarily to improve Her symptoms, and for the patient's long term health benefit, including to reduce Her cardiovascular risks. I answered all her questions today and the patient was in agreement. I plan to see her back after the sleep study is completed and encouraged her to call with any interim questions, concerns, problems or updates.   Thank you very much for allowing me to participate in the care of this nice patient. If I can be of any further assistance to you please do not hesitate to call me at [phone].  Sincerely,   Star Age, MD, PhD

## 2017-12-31 ENCOUNTER — Encounter (INDEPENDENT_AMBULATORY_CARE_PROVIDER_SITE_OTHER): Payer: Self-pay | Admitting: Radiology

## 2017-12-31 MED ORDER — ATORVASTATIN CALCIUM 40 MG PO TABS: 40.0000 mg | ORAL_TABLET | Freq: Every day | ORAL | 3 refills | Status: DC

## 2017-12-31 NOTE — Telephone Encounter (Signed)
I sent Mychart message to patient

## 2017-12-31 NOTE — Telephone Encounter (Signed)
Pt informed that form is completed and I will place in to be faxed pile to fax tomorrow.   Copy made for batch scanning. Fleeger, Salome Spotted, CMA

## 2018-01-03 DIAGNOSIS — J9621 Acute and chronic respiratory failure with hypoxia: Secondary | ICD-10-CM | POA: Diagnosis not present

## 2018-01-05 ENCOUNTER — Encounter: Payer: Self-pay | Admitting: Student in an Organized Health Care Education/Training Program

## 2018-01-05 ENCOUNTER — Encounter (INDEPENDENT_AMBULATORY_CARE_PROVIDER_SITE_OTHER): Payer: Self-pay | Admitting: Specialist

## 2018-01-05 ENCOUNTER — Other Ambulatory Visit: Payer: Self-pay | Admitting: Student in an Organized Health Care Education/Training Program

## 2018-01-05 ENCOUNTER — Ambulatory Visit (INDEPENDENT_AMBULATORY_CARE_PROVIDER_SITE_OTHER): Payer: Medicare Other | Admitting: Specialist

## 2018-01-05 VITALS — BP 155/93 | HR 87 | Ht 70.0 in | Wt >= 6400 oz

## 2018-01-05 DIAGNOSIS — M5137 Other intervertebral disc degeneration, lumbosacral region: Secondary | ICD-10-CM

## 2018-01-05 DIAGNOSIS — G4733 Obstructive sleep apnea (adult) (pediatric): Secondary | ICD-10-CM

## 2018-01-05 DIAGNOSIS — I5032 Chronic diastolic (congestive) heart failure: Secondary | ICD-10-CM

## 2018-01-05 DIAGNOSIS — Z6841 Body Mass Index (BMI) 40.0 and over, adult: Secondary | ICD-10-CM

## 2018-01-05 DIAGNOSIS — M48062 Spinal stenosis, lumbar region with neurogenic claudication: Secondary | ICD-10-CM | POA: Diagnosis not present

## 2018-01-05 DIAGNOSIS — E1122 Type 2 diabetes mellitus with diabetic chronic kidney disease: Secondary | ICD-10-CM

## 2018-01-05 DIAGNOSIS — N183 Chronic kidney disease, stage 3 unspecified: Secondary | ICD-10-CM

## 2018-01-05 DIAGNOSIS — N184 Chronic kidney disease, stage 4 (severe): Secondary | ICD-10-CM

## 2018-01-05 DIAGNOSIS — G8929 Other chronic pain: Secondary | ICD-10-CM

## 2018-01-05 DIAGNOSIS — M65331 Trigger finger, right middle finger: Secondary | ICD-10-CM

## 2018-01-05 DIAGNOSIS — Z794 Long term (current) use of insulin: Secondary | ICD-10-CM

## 2018-01-05 DIAGNOSIS — M51379 Other intervertebral disc degeneration, lumbosacral region without mention of lumbar back pain or lower extremity pain: Secondary | ICD-10-CM

## 2018-01-05 NOTE — Progress Notes (Addendum)
Office Visit Note   Patient: [name]           Date of Birth: [birth year]           MRN: [id number] Visit Date: 01/05/2018              Requested by: [name], MD [address] [place], [place] [address] PCP: [name], MD   Assessment & Plan: Visit Diagnoses:  1. Spinal stenosis of lumbar region with neurogenic claudication   2. Disc disease, degenerative, lumbar or lumbosacral   3. Other chronic pain   4. Chronic diastolic heart failure ([place])   5. OSA (obstructive sleep apnea)   6. Type 2 diabetes mellitus with stage 3 chronic kidney disease, with long-term current use of insulin ([place])   7. Chronic kidney disease (CKD), stage IV (severe) ([place])   8. Body mass index 50.0-59.9, adult ([place])   9. Trigger finger, right middle finger    Not really able to consider a surgical solution will refer her to pain management and stop providing any narcotics since we are not able to offer a Surgical solution. OT for trigger finger right long finger and follow up with Dr. Erlinda Hong. Continue weight loss program, See me in 6 months.  Plan:Avoid bending, stooping and avoid lifting weights greater than 10 lbs. Avoid prolong standing and walking. Avoid frequent bending and stooping  No lifting greater than 10 lbs. May use ice or moist heat for pain. Weight loss is of benefit. Handicap license is approved. You are not a surgical candidate and I will not continue to provide narcotics for your condition as I do not have a surgical solution. The problem with your hand is due to trigger finger, stretching exercises help and cortisone injection. See an occupational therapist for right long finger triggering.    Follow-Up Instructions: Return in about 6 months (around 07/07/2018) for See Dr. Erlinda Hong for follow up of her trigger fingers. .   Orders:  Orders Placed This Encounter  Procedures  . Ambulatory referral to Pain Clinic  . Ambulatory referral to Occupational Therapy   No orders of the  defined types were placed in this encounter.     Procedures: No procedures performed   Clinical Data: No additional findings.   Subjective: Chief Complaint  Patient presents with  . Lower Back - Follow-up    52 year old female with history of lumbar degenerative disc disease and lumbar spinal stenosis. She has been seen previously by Dr. Erlinda Hong for multiple foot fractures and has had bilateral hand surgery for trigger fingers right index and left long and ring finger. She is being scheduled for bariatric surgery and due to medical risks she is opting for dieting and nutrition counseling. She is taking regular amount so narcotic medication. She has renal insufficiency and is unable to take NSAIDs.    Review of Systems  Constitutional: Negative.   HENT: Negative.   Eyes: Negative.   Respiratory: Negative.   Cardiovascular: Negative.   Gastrointestinal: Negative.   Endocrine: Negative.   Genitourinary: Negative.   Musculoskeletal: Negative.   Skin: Negative.   Allergic/Immunologic: Negative.   Neurological: Negative.   Hematological: Negative.   Psychiatric/Behavioral: Negative.     Body mass index is 57.97 kg/m.  Objective: Vital Signs: BP (!) 155/93 (BP Location: Left Arm, Patient Position: Sitting)   Pulse 87   Ht 5\' 10"  (1.778 m)   Wt (!) 404 lb (183.3 kg)   LMP 04/10/2006  BMI 57.97 kg/m   Physical Exam  Constitutional: She is oriented to person, place, and time. She appears well-developed and well-nourished. No distress.  HENT:  Head: Normocephalic.  Eyes: Pupils are equal, round, and reactive to light. Conjunctivae and EOM are normal. Right eye exhibits no discharge. Left eye exhibits no discharge. No scleral icterus.  Neck: Normal range of motion. Neck supple. No JVD present. No tracheal deviation present. No thyromegaly present.  Pulmonary/Chest: Breath sounds normal. Stridor present. She is in respiratory distress.  Abdominal: She exhibits distension. She  exhibits no mass. There is no tenderness. There is no rebound and no guarding. No hernia.  Lymphadenopathy:    She has no cervical adenopathy.  Neurological: She is oriented to person, place, and time.  Skin: Skin is warm and dry. She is not diaphoretic.  Psychiatric: She has a normal mood and affect. Her behavior is normal. Judgment and thought content normal.  Nursing note reviewed.   Back Exam   Tenderness  The patient is experiencing tenderness in the lumbar.  Range of Motion  Extension: normal  Flexion: normal  Lateral bend right: normal  Lateral bend left: normal  Rotation right: normal  Rotation left: normal   Muscle Strength  Right Quadriceps:  5/5  Left Quadriceps:  5/5  Right Hamstrings:  5/5  Left Hamstrings:  5/5   Tests  Straight leg raise right: negative Straight leg raise left: negative  Reflexes  Patellar: 2/4 Achilles: 2/4 Biceps: 2/4  Other  Toe walk: normal Heel walk: normal Sensation: normal Gait: normal  Erythema: no back redness Scars: absent      Specialty Comments:  No specialty comments available.  Imaging: No results found.   PMFS History: Patient Active Problem List   Diagnosis Date Noted  . Syncope 11/04/2017  . Left foot pain 11/04/2017  . Open wound of left foot 11/04/2017  . Essential hypertension 10/27/2017  . Solitary pulmonary nodule 10/07/2017  . Acute on chronic congestive heart failure ([place])   . Acute on chronic right-sided congestive heart failure ([place])   . Acute on chronic respiratory failure with hypoxemia ([place])   . Leg edema, right 10/01/2017  . Restrictive lung disease secondary to obesity 10/01/2017  . Polycythemia, secondary 10/01/2017  . Oxygen desaturation 09/30/2017  . Chronic viral hepatitis B without delta-agent ([place]) 07/08/2017  . COPD (chronic obstructive pulmonary disease) with chronic bronchitis ([place]) 06/27/2017  . Chronic kidney disease (CKD), stage IV (severe) ([place]) 01/14/2017  . Type 2  diabetes mellitus with stage 3 chronic kidney disease, with long-term current use of insulin ([place]) 12/12/2016  . Urge incontinence of urine 12/05/2016  . Class 3 severe obesity due to excess calories with serious comorbidity and body mass index (BMI) of 50.0 to 59.9 in adult ([place]) 10/02/2012  . Chronic diastolic heart failure ([place]) 04/07/2012  . GERD 01/26/2010  . Hypertension 08/04/2008  . OSA (obstructive sleep apnea) 02/03/2008  . FIBROCYSTIC BREAST DISEASE 10/28/2006  . Hyperlipidemia 03/27/2006  . Obesity hypoventilation syndrome ([place]) 03/27/2006  . TOBACCO DEPENDENCE 03/27/2006  . Depression with anxiety 03/27/2006   Past Medical History:  Diagnosis Date  . Arthritis 04-10-11   hips, shoulders, back  . Asthma   . Bipolar disorder ([place])   . Cancer ([place]) 1993   cervical, no treatment done, went away per pt  . Cervical dysplasia or atypia 04-10-11   '93- once dx.-got pregnant-no intervention, then postpartum, no dysplasia found  . CHF (congestive heart failure) ([place])    no  cardiologist 2014 dx, none now  . 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 Pathology showed Condyloma, gluteal CONDYLOMA ACUMINATUM.   [name] Kitchen Dyspnea    with actity - better since taking Singular  . Fall 12/16/2016  . Fibrocystic breast disease   . Hypercalcemia   . Hyperlipidemia   . Hypertension   . Migraine headache    none recent  . Morbid obesity ([place]) 03/27/2006  . Neuropathy   . Skin lesion 03/15/2011   In gluteal crease now s/p removal by Dr. [name] of General Surgery on 3/12. Path shows condyloma.     . Sleep apnea 04-10-11   uses cpap, pt does not know settings  . Trigger finger    left third    Family History  Problem Relation Age of Onset  . Stroke Father   . Cancer Maternal Aunt        breast  . Breast cancer Maternal Aunt   . Asthma Other   . COPD Other   . Hypertension Other   . Diabetes Other     Past Surgical History:  Procedure Laterality Date  . ANAL  FISTULECTOMY  04/17/2011   Procedure: FISTULECTOMY ANAL;  Surgeon: Imogene Burn. [name], MD;  Location: [facility];  Service: General;  Laterality: N/A;  Excision of Condyloma Gluteal Cleft   . BACK SURGERY  04-10-11   x5-Lumbar fusion-retained hardware.(Dr. Louanne Skye)  . BREAST CYST EXCISION     bilateral breast, 3 cysts removed from each breast  . BREAST EXCISIONAL BIOPSY Right    x 3  . BREAST EXCISIONAL BIOPSY Left    x 3  . CARPAL TUNNEL RELEASE Bilateral   . Cervical biospy    . COLONOSCOPY WITH PROPOFOL N/A 07/06/2015   Procedure: COLONOSCOPY WITH PROPOFOL;  Surgeon: [name], MD;  Location: [facility];  Service: Endoscopy;  Laterality: N/A;  . DOPPLER ECHOCARDIOGRAPHY  04/06/2012   AT [place] 55-60%  . I&D EXTREMITY Left 12/18/2016   Procedure: IRRIGATION AND DEBRIDEMENT LEFT FOOT, CLOSURE;  Surgeon: [name], MD;  Location: [facility];  Service: Orthopedics;  Laterality: Left;  [name] Kitchen MULTIPLE TOOTH EXTRACTIONS    . NECK SURGERY  04-10-11   x3- cervical fusion with plating and screws-Dr. [name]  . TEE WITHOUT CARDIOVERSION N/A 04/06/2012   Procedure: TRANSESOPHAGEAL ECHOCARDIOGRAM (TEE);  Surgeon: [name], MD;  Location: [facility] ENDOSCOPY;  Service: Cardiovascular;  Laterality: N/A;  . TRIGGER FINGER RELEASE Right    middle finger  . TRIGGER FINGER RELEASE Left 06/28/2016   Procedure: RELEASE TRIGGER FINGER LEFT 3RD FINGER;  Surgeon: [name], MD;  Location: [facility];  Service: Orthopedics;  Laterality: Left;  . TRIGGER FINGER RELEASE Right 06/12/2016   Procedure: RIGHT INDEX FINGER TRIGGER RELEASE;  Surgeon: [name], MD;  Location: [facility];  Service: Orthopedics;  Laterality: Right;   Social History   Occupational History  . Not on file  Tobacco Use  . Smoking status: Former Smoker    Packs/day: 1.00    Years: 39.00    Pack years: 39.00    Types: Cigarettes    Last attempt to quit: 09/07/2017    Years since quitting: 0.3  . Smokeless tobacco: Never Used  . Tobacco  comment: Quit prior to hospitalization using low dose Chantix  Substance and Sexual Activity  . Alcohol use: No    Alcohol/week: 0.0 standard drinks  . Drug use: Not Currently    Types: Marijuana, "Crack" cocaine  Comment: hx marijuana usemany years ago, Crack -none since 11/2015  . Sexual activity: Yes    Partners: Male

## 2018-01-05 NOTE — Patient Instructions (Addendum)
Avoid bending, stooping and avoid lifting weights greater than 10 lbs. Avoid prolong standing and walking. Avoid frequent bending and stooping  No lifting greater than 10 lbs. May use ice or moist heat for pain. Weight loss is of benefit. Handicap license is approved. You are not a surgical candidate and I will not continue to provide narcotics for your condition as I do not have a surgical solution. The problem with your hand is due to trigger finger, stretching exercises help and cortisone injection. See an occupational therapist for right long finger triggering.

## 2018-01-07 ENCOUNTER — Encounter: Payer: Self-pay | Admitting: Student in an Organized Health Care Education/Training Program

## 2018-01-08 ENCOUNTER — Ambulatory Visit (INDEPENDENT_AMBULATORY_CARE_PROVIDER_SITE_OTHER): Payer: Medicare Other | Admitting: Cardiology

## 2018-01-08 ENCOUNTER — Encounter: Payer: Self-pay | Admitting: Cardiology

## 2018-01-08 VITALS — BP 178/79 | HR 93 | Ht 70.0 in | Wt >= 6400 oz

## 2018-01-08 DIAGNOSIS — Z79899 Other long term (current) drug therapy: Secondary | ICD-10-CM

## 2018-01-08 DIAGNOSIS — I10 Essential (primary) hypertension: Secondary | ICD-10-CM | POA: Diagnosis not present

## 2018-01-08 DIAGNOSIS — I5032 Chronic diastolic (congestive) heart failure: Secondary | ICD-10-CM

## 2018-01-08 DIAGNOSIS — E7800 Pure hypercholesterolemia, unspecified: Secondary | ICD-10-CM | POA: Diagnosis not present

## 2018-01-08 DIAGNOSIS — I471 Supraventricular tachycardia: Secondary | ICD-10-CM | POA: Diagnosis not present

## 2018-01-08 DIAGNOSIS — Z6841 Body Mass Index (BMI) 40.0 and over, adult: Secondary | ICD-10-CM

## 2018-01-08 MED ORDER — DILTIAZEM HCL ER COATED BEADS 120 MG PO CP24: 120.0000 mg | ORAL_CAPSULE | Freq: Two times a day (BID) | ORAL | 3 refills | Status: DC

## 2018-01-08 NOTE — Patient Instructions (Signed)
Medication Instructions:  Your physician has recommended you make the following change in your medication:  INCREASE Diltiazem to 120mg  twice daily. You have been given a written Rx today  If you need a refill on your cardiac medications before your next appointment, please call your pharmacy.   Lab work: None ordered If you have labs (blood work) drawn today and your tests are completely normal, you will receive your results only by: [name] Kitchen MyChart Message (if you have MyChart) OR . A paper copy in the mail If you have any lab test that is abnormal or we need to change your treatment, we will call you to review the results.  Testing/Procedures: None ordered  Follow-Up: At [facility], you and your health needs are our priority.  As part of our continuing mission to provide you with exceptional heart care, we have created designated Provider Care Teams.  These Care Teams include your primary Cardiologist (physician) and Advanced Practice Providers (APPs -  Physician Assistants and Nurse Practitioners) who all work together to provide you with the care you need, when you need it. You will need a follow up appointment in 2 months.    You may see [name], MD or one of the following Advanced Practice Providers on your designated Care Team:   Rosaria Ferries, PA-C . Jory Sims, DNP, ANP

## 2018-01-08 NOTE — Progress Notes (Signed)
Cardiology Office Note:    Date:  42/70/6237   ID:  [name], DOB [birth year], MRN [id number]  PCP:  [name], MD  Cardiologist:  [name], MD PhD  Referring MD: [name], MD   CC: shortness of breath, newly diagnosed SVT  History of Present Illness:    [name] is a 52 y.o. female with a hx of morbid obesity (BMI 83), type II diabetes, heart failure with preserved ejection fraction, hypertension who is seen in follow up for the evaluation and management of shortness of breath, newly diagnose SVT  Initial consult was 10/27/17. She was admitted from 9/3-9/4 for acute on chronic diastolic heart failure. She was discharged on furosemide 40 mg BID and home oxygen of 2L when ambulating. She has sleep apnea and uses CPAP at home.  Her concerns at that time were severe shortness of breath associated with chest pain multiple times per day with even minimal exertion. We performed a lexiscan on 11/11/17 which was low risk, normal EF, no ST changes, possible apical septal infarct vs. Artifact, no ischemia.   Today:  Started diltiazem, was feeling better with breathing "crises" and chest tightness. She was able to be active, clean house, etc. Seems to last about 12 hours for her, then wears off. Takes at 8 am and does well until about 8 pm. On 12/9, had a difficult day, felt like her breathing didn't get better and hasn't felt better since.  Has a mark on her chest from where the monitor was on, felt like it was "burning" her. Could only wear for a few days.   No syncope or lightheadedness since starting dilitazem. Doesn't use BP cuff routinely at home, has difficulty with the cuff. Does weigh herself at home, has been variable. Goes next week back to the nutritionist, working on losing weight.   -Prior cardiac history: diastolic dysfunction on echo, clinical HFpEF with hospitalizations -Tobacco: quit 09/08/17. Was 1 ppd x 40 years.  -Comorbidities: morbid obesity,  type II diabetes, hypertension -Exercise level: none. On constant home O2.  Past Medical History:  Diagnosis Date  . Arthritis 04-10-11   hips, shoulders, back  . Asthma   . Bipolar disorder ([place])   . Cancer ([place]) 1993   cervical, no treatment done, went away per pt  . Cervical dysplasia or atypia 04-10-11   '93- once dx.-got pregnant-no intervention, then postpartum, no dysplasia found  . CHF (congestive heart failure) ([place])    no cardiologist 2014 dx, none now  . 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 Pathology showed Condyloma, gluteal CONDYLOMA ACUMINATUM.   [name] Kitchen Dyspnea    with actity - better since taking Singular  . Fall 12/16/2016  . Fibrocystic breast disease   . Hypercalcemia   . Hyperlipidemia   . Hypertension   . Migraine headache    none recent  . Morbid obesity ([place]) 03/27/2006  . Neuropathy   . Skin lesion 03/15/2011   In gluteal crease now s/p removal by Dr. [name] of General Surgery on 3/12. Path shows condyloma.     . Sleep apnea 04-10-11   uses cpap, pt does not know settings  . Trigger finger    left third    Past Surgical History:  Procedure Laterality Date  . ANAL FISTULECTOMY  04/17/2011   Procedure: FISTULECTOMY ANAL;  Surgeon: Imogene Burn. [name], MD;  Location: [facility];  Service: General;  Laterality: N/A;  Excision of Condyloma Gluteal Cleft   .  BACK SURGERY  04-10-11   x5-Lumbar fusion-retained hardware.(Dr. Louanne Skye)  . BREAST CYST EXCISION     bilateral breast, 3 cysts removed from each breast  . BREAST EXCISIONAL BIOPSY Right    x 3  . BREAST EXCISIONAL BIOPSY Left    x 3  . CARPAL TUNNEL RELEASE Bilateral   . Cervical biospy    . COLONOSCOPY WITH PROPOFOL N/A 07/06/2015   Procedure: COLONOSCOPY WITH PROPOFOL;  Surgeon: [name], MD;  Location: [facility];  Service: Endoscopy;  Laterality: N/A;  . DOPPLER ECHOCARDIOGRAPHY  04/06/2012   AT [place] 55-60%  . I&D EXTREMITY Left 12/18/2016   Procedure: IRRIGATION  AND DEBRIDEMENT LEFT FOOT, CLOSURE;  Surgeon: [name], MD;  Location: [facility];  Service: Orthopedics;  Laterality: Left;  [name] Kitchen MULTIPLE TOOTH EXTRACTIONS    . NECK SURGERY  04-10-11   x3- cervical fusion with plating and screws-Dr. [name]  . TEE WITHOUT CARDIOVERSION N/A 04/06/2012   Procedure: TRANSESOPHAGEAL ECHOCARDIOGRAM (TEE);  Surgeon: [name], MD;  Location: [facility] ENDOSCOPY;  Service: Cardiovascular;  Laterality: N/A;  . TRIGGER FINGER RELEASE Right    middle finger  . TRIGGER FINGER RELEASE Left 06/28/2016   Procedure: RELEASE TRIGGER FINGER LEFT 3RD FINGER;  Surgeon: [name], MD;  Location: [name];  Service: Orthopedics;  Laterality: Left;  . TRIGGER FINGER RELEASE Right 06/12/2016   Procedure: RIGHT INDEX FINGER TRIGGER RELEASE;  Surgeon: [name], MD;  Location: [facility];  Service: Orthopedics;  Laterality: Right;    Current Medications: Current Outpatient Medications on File Prior to Visit  Medication Sig  . ACCU-CHEK AVIVA PLUS test strip TEST 4 TIMES DAILY  . ACCU-CHEK SOFTCLIX LANCETS lancets Use as instructed  . albuterol (PROAIR HFA) 108 (90 Base) MCG/ACT inhaler USE 1-2 PUFFS INTO LUNGS EVERY 4 HOURS AS NEEDED FOR WHEEZING OR SHORTNESS OF BREATH.  [name] Kitchen allopurinol (ZYLOPRIM) 100 MG tablet Take 100 mg by mouth daily.  [name] Kitchen ammonium lactate (AMLACTIN) 12 % lotion Apply 1 application topically 2 (two) times daily as needed for dry skin.  [name] Kitchen atorvastatin (LIPITOR) 40 MG tablet Take 1 tablet (40 mg total) by mouth daily.  . baclofen (LIORESAL) 10 MG tablet TAKE 2 TABLETS (20 MG TOTAL) BY MOUTH 3 (THREE) TIMES DAILY.  . blood glucose meter kit and supplies KIT Dispense based on patient and insurance preference. Use up to four times daily as directed. (FOR ICD-9 250.00, 250.01).  . busPIRone (BUSPAR) 10 MG tablet Take 1 tablet (10 mg total) by mouth 3 (three) times daily.  . Capsaicin-Menthol (PAIN RELIEF EX) Apply 1 application topically 3 (three) times daily as needed  (shoulder pain).  . citalopram (CELEXA) 40 MG tablet TAKE 1 TABLET BY MOUTH EVERY DAY  . diclofenac sodium (VOLTAREN) 1 % GEL APPLY 2 GRAMS TO AFFECTED AREA 4 TIMES A DAY  . diltiazem (CARDIZEM CD) 120 MG 24 hr capsule Take 1 capsule (120 mg total) by mouth daily.  [name] Kitchen docusate sodium (COLACE) 100 MG capsule Take 200 mg by mouth 2 (two) times daily.  . empagliflozin (JARDIANCE) 25 MG TABS tablet Take 25 mg by mouth daily.  . famotidine (PEPCID) 10 MG tablet Take 1 tablet (10 mg total) by mouth daily.  . fluticasone (FLONASE) 50 MCG/ACT nasal spray USE 2 SPRAYS EACH NOSTRIL TWICE DAILY.  . furosemide (LASIX) 40 MG tablet TAKE 1 TABLET BY MOUTH TWICE A DAY  . gabapentin (NEURONTIN) 300 MG capsule TAKE 2 CAPSULES (600 MG TOTAL) BY  MOUTH 3 (THREE) TIMES DAILY.  [name] Kitchen HYDROcodone-acetaminophen (NORCO) 7.5-325 MG tablet Take 1-2 tablets by mouth every 6 (six) hours as needed for moderate pain or severe pain.  . hydrOXYzine (VISTARIL) 100 MG capsule Take 1 capsule (100 mg total) by mouth 3 (three) times daily as needed for itching.  . Insulin Glargine (LANTUS SOLOSTAR) 100 UNIT/ML Solostar Pen Inject 10 Units into the skin daily.  . insulin lispro (HUMALOG KWIKPEN) 100 UNIT/ML KiwkPen Inject 0.03 mLs (3 Units total) into the skin 3 (three) times daily before meals.  . Insulin Pen Needle (B-D UF III MINI PEN NEEDLES) 31G X 5 MM MISC CHECK SUGARS 4 TIMES A DAY BEFORE MEALS AND AT BEDTIME.  [name] Kitchen ketoconazole (NIZORAL) 2 % cream Apply 1 fingertip amount to each foot daily. (Patient taking differently: Apply 1 application topically 2 (two) times daily. Apply 1 fingertip amount to each foot TWICE DAILY)  . loratadine (CLARITIN) 10 MG tablet Take 1 tablet (10 mg total) by mouth daily.  [name] Kitchen losartan (COZAAR) 100 MG tablet Take 1 tablet (100 mg total) by mouth daily.  . montelukast (SINGULAIR) 10 MG tablet Take 1 tablet (10 mg total) by mouth at bedtime. AS DIRECTED  . Multiple Vitamins-Minerals (MULTIVITAMIN WITH MINERALS)  tablet Take 1 tablet by mouth daily.  . mupirocin ointment (BACTROBAN) 2 % Apply 1 application topically 2 (two) times daily.  . NON FORMULARY Uses a C-PAP at bedtime  . nystatin (MYCOSTATIN) 100000 UNIT/ML suspension Take 5 mLs (500,000 Units total) by mouth 4 (four) times daily. Swish and swallow.  [name] Kitchen omeprazole (PRILOSEC) 20 MG capsule Take 1 capsule (20 mg total) by mouth daily as needed. (Patient taking differently: Take 20 mg by mouth daily. )  . oxybutynin (DITROPAN XL) 10 MG 24 hr tablet Take 1 tablet (10 mg total) by mouth at bedtime.  . TRELEGY ELLIPTA 100-62.5-25 MCG/INH AEPB INHALE 1 PUFF INTO THE LUNGS DAILY  . varenicline (CHANTIX) 0.5 MG tablet Take 1 tablet (0.5 mg total) by mouth 2 (two) times daily. One daily x 3 days BID  . Vitamin D, Ergocalciferol, (DRISDOL) 50000 units CAPS capsule Take 50,000 Units by mouth every 7 (seven) days. MONDAYS  . [DISCONTINUED] sucralfate (CARAFATE) 1 G tablet Take 1 tablet (1 g total) by mouth daily.   Current Facility-Administered Medications on File Prior to Visit  Medication  . technetium tetrofosmin (TC-MYOVIEW) injection 16.0 millicurie     Allergies:   Chantix [varenicline tartrate]; Methadone hcl; and Levofloxacin   Social History   Socioeconomic History  . Marital status: Divorced    Spouse name: Not on file  . Number of children: Not on file  . Years of education: Not on file  . Highest education level: Not on file  Occupational History  . Not on file  Social Needs  . Financial resource strain: Not on file  . Food insecurity:    Worry: Never true    Inability: Often true  . Transportation needs:    Medical: Not on file    Non-medical: Not on file  Tobacco Use  . Smoking status: Former Smoker    Packs/day: 1.00    Years: 39.00    Pack years: 39.00    Types: Cigarettes    Last attempt to quit: 09/07/2017    Years since quitting: 0.3  . Smokeless tobacco: Never Used  . Tobacco comment: Quit prior to hospitalization  using low dose Chantix  Substance and Sexual Activity  . Alcohol use: No  Alcohol/week: 0.0 standard drinks  . Drug use: Not Currently    Types: Marijuana, "Crack" cocaine    Comment: hx marijuana usemany years ago, Crack -none since 11/2015  . Sexual activity: Yes    Partners: Male  Lifestyle  . Physical activity:    Days per week: Not on file    Minutes per session: Not on file  . Stress: Not on file  Relationships  . Social connections:    Talks on phone: Not on file    Gets together: Not on file    Attends religious service: Not on file    Active member of club or organization: Not on file    Attends meetings of clubs or organizations: Not on file    Relationship status: Not on file  Other Topics Concern  . Not on file  Social History Narrative   Current Social History 01/23/2017        Who lives at home: Patient lives alone in one level home 01/23/2017    Transportation: Patient has own vehicle and drives herself 88/32/5498   Important Relationships "Family and friends" 01/23/2017    Pets: None 01/23/2017   Education / Work:  12 th grade/ None 01/23/2017   Interests / Fun: "Play bingo on phone, watch TV, Be with family and friends, sit on my porch." 01/23/2017   Current Stressors: None 01/23/2017   Religious / Personal Beliefs: "God Jesus" 01/23/2017   Other: "I love everyone, I wake up with joy in my heart and go to sleep the same way. I'm a lovable person." 01/23/2017   L. Ducatte, RN, BSN                                                                                                   Family History: The patient's family history includes Asthma in an other family member; Breast cancer in her maternal aunt; COPD in an other family member; Cancer in her maternal aunt; Diabetes in an other family member; Hypertension in an other family member; Stroke in her father. no CAD known. Father died age 16 from stroke, mother died age 73 of blood clot. Has a 51 year old child  with sickle cell disease.  ROS:   Please see the history of present illness.  Additional pertinent ROS: Constitutional: Negative for chills, fever, night sweats, unintentional weight loss  HENT: Negative for ear pain and hearing loss.   Eyes: Negative for loss of vision and eye pain.  Respiratory: Positive for shortness of breath. Negative for cough, sputum,  wheezing.   Cardiovascular: Positive for chest tightness, mild LE edema. Negative for PND, orthopnea (sleeps on right side due to back), and claudication.  Gastrointestinal: Negative for abdominal pain, melena, and hematochezia.  Genitourinary: Negative for dysuria and hematuria.  Musculoskeletal: Negative for falls. Positive for muscle spasms. Skin: Negative for itching and rash.  Neurological: Negative for focal weakness, focal sensory changes and loss of consciousness.  Endo/Heme/Allergies: Does not bruise/bleed easily.   EKGs/Labs/Other Studies Reviewed:    The following studies were reviewed today: Monitor 12/18/17 3 days of rhythm interpreted  on Zio patch. Patient had a min HR of 58 bpm, max HR of 193 bpm, and avg HR of 81 bpm. Predominant underlying rhythm was Sinus Rhythm. 217 Supraventricular Tachycardia runs occurred, the run with the fastest interval lasting 6 beats with a max rate of 193 bpm, the longest lasting 1 min 13 secs with an avg rate of 151 bpm. True duration of Supraventricular Tachycardia difficult to ascertain due to Artifact. Thirteen triggered patient events recorded. Supraventricular Tachycardia was detected within +/- 45 seconds of symptomatic patient event(s). Isolated SVEs were occasional (2.0%, 6813), SVE Couplets were rare (<1.0%, 235), and SVE Triplets were rare (<1.0%, 66). No significant ventricular ectopy noted.  Echo 9.4.19 Study Conclusions  - Left ventricle: The cavity size was normal. Wall thickness was   normal. Systolic function was normal. The estimated ejection   fraction was in the range  of 55% to 60%. Wall motion was normal;   there were no regional wall motion abnormalities. Doppler   parameters are consistent with abnormal left ventricular   relaxation (grade 1 diastolic dysfunction).  Lexiscan MPI 11/11/17 Study Highlights    Nuclear stress EF: 71%.  The left ventricular ejection fraction is hyperdynamic (>65%).  There was no ST segment deviation noted during stress.  There is a small defect of mild severity present in the apical septal location. The defect is non-reversible. No ischemia noted.  This is a low risk study.   EKG:  EKG is personally reviewed today.  The ekg ordered previously demonstrates normal sinus rhythm with atrial bigeminy, poor R wave progression  Recent Labs: 09/30/2017: B Natriuretic Peptide 45.0 10/01/2017: ALT 31; TSH 0.419 10/03/2017: Hemoglobin 15.0; Platelets 301 10/07/2017: Magnesium 1.9 10/16/2017: BUN 18; Creatinine, Ser 1.31; Potassium 4.5; Sodium 144  Recent Lipid Panel    Component Value Date/Time   CHOL 145 10/27/2017 0944   TRIG 98 10/27/2017 0944   HDL 55 10/27/2017 0944   CHOLHDL 2.6 10/27/2017 0944   CHOLHDL 4.0 04/17/2015 0917   VLDL 34 (H) 04/17/2015 0917   LDLCALC 70 10/27/2017 0944   LDLDIRECT 114 (H) 09/15/2012 1559    Physical Exam:    VS:  BP (!) 178/79   Pulse 93   Ht '5\' 10"'  (1.778 m)   Wt (!) 412 lb (186.9 kg)   LMP 04/10/2006   SpO2 94%   BMI 59.12 kg/m     Wt Readings from Last 3 Encounters:  01/08/18 (!) 412 lb (186.9 kg)  01/05/18 (!) 404 lb (183.3 kg)  12/30/17 (!) 405 lb (183.7 kg)    GEN: Well nourished, well developed in no acute distress. O2 via nasal cannula in place. HEENT: Normal NECK: No apparent JVD (difficult due to body habitus); No carotid bruits LYMPHATICS: No lymphadenopathy CARDIAC: distant heart sounds, regular rhythm, normal S1 and S2, no murmurs, rubs, gallops. Radial and DP pulses 2+ bilaterally. RESPIRATORY:  Distant but clear to auscultation without rales, wheezing or  rhonchi  ABDOMEN: Soft, non-tender, non-distended MUSCULOSKELETAL:  No pitting edema, but does have some chronic brawny edema bilaterally; No deformity  SKIN: Warm and dry NEUROLOGIC:  Alert and oriented x 3 PSYCHIATRIC:  Normal affect   ASSESSMENT:    No diagnosis found. PLAN:    1. History of syncope, with recent SVT diagnosis:  Likely at least partial cause of her chest tightness, shortness of breath as these improved briefly with treatment.   -on monitor, had frequent SVT, trialing diltiazem as wish to avoid beta blockers with her lung disease  -  has already had echo and lexiscan stress test, unremarkable  -no additional recent syncope, tolerating diltiazem except that it seems to wear off for her. Has plenty of BP room for titration. Will increase to BID dosing and see if that seems to improve her symptoms.   -shortness of breath multifactorial, including underlying lung disease, obesity hypoventilation syndrome  2. Class 3 obesity, BMI 59: Actively pursuing bariatric surgery and weight loss. Her obesity is causing life threatening complications, including chronic respiratory failure requiring oxygen likely due to obesity hypoventilation syndrome. Her weight is also significantly contributing to her chronic diastolic heart failure, and with a recent hospitalization for acute exacerbation, minimizing contributing factors to her heart failure with preserved ejection fraction is key. I fully support her efforts to pursue gastric bypass surgery given her comorbid conditions.   3. Hypercholesterolemia: LDL was 70 on 10/27/17, at goal. Continue atorvastatin 40 mg. Coronary artery calcification seen on chest CT, but no ischemia on recent lexiscan.  4. Hypertension: Very elevated today, but she is under significant stress and discomfort with her breathing at this visit. Recent visits note good control. Increasing diltiazem today, will hold on further changes for now.   5. Chronic diastolic heart  failure: Weight up slightly today, exam is difficult but does not appear significantly changed from prior. On SGLT2 inhibitor, with benefit for both heart failure and her type II diabetes.   -counseled on daily weights, fluid restriction, salt guidelines, when to seek immediate medical attention  6. OSA on CPAP, chronic hypoxemic respiratory failure on oxygen: followed by pulmonology  7. Prevention and cardiac disease counseling, including heart failure education:   The 10-year ASCVD risk score Mikey Bussing DC Jr., et al., 2013) is: 42.4%   Values used to calculate the score:     Age: 6 years     Sex: Female     Is Non-Hispanic African American: Yes     Diabetic: Yes     Tobacco smoker: Yes     Systolic Blood Pressure: 619 mmHg     Is BP treated: Yes     HDL Cholesterol: 55 mg/dL     Total Cholesterol: 145 mg/dL  Plan for follow up: 2 mos  TIME SPENT WITH PATIENT: 25 minutes of direct patient care. More than 50% of that time was spent on coordination of care and counseling regarding SVT, symptoms, management.  [name], MD, PhD [place]  [facility]   Medication Adjustments/Labs and Tests Ordered: Current medicines are reviewed at length with the patient today.  Concerns regarding medicines are outlined above.  No orders of the defined types were placed in this encounter.  Meds ordered this encounter  Medications  . diltiazem (CARDIZEM CD) 120 MG 24 hr capsule    Sig: Take 1 capsule (120 mg total) by mouth 2 (two) times daily.    Dispense:  180 capsule    Refill:  3    Patient Instructions  Medication Instructions:  Your physician has recommended you make the following change in your medication:  INCREASE Diltiazem to 122m twice daily. You have been given a written Rx today  If you need a refill on your cardiac medications before your next appointment, please call your pharmacy.   Lab work: None ordered If you have labs (blood work) drawn today and your  tests are completely normal, you will receive your results only by: .[name] KitchenMyChart Message (if you have MyChart) OR . A paper copy in the mail If you have any  lab test that is abnormal or we need to change your treatment, we will call you to review the results.  Testing/Procedures: None ordered  Follow-Up: At [facility], you and your health needs are our priority.  As part of our continuing mission to provide you with exceptional heart care, we have created designated Provider Care Teams.  These Care Teams include your primary Cardiologist (physician) and Advanced Practice Providers (APPs -  Physician Assistants and Nurse Practitioners) who all work together to provide you with the care you need, when you need it. You will need a follow up appointment in 2 months.    You may see [name], MD or one of the following Advanced Practice Providers on your designated Care Team:   Rosaria Ferries, PA-C . Jory Sims, DNP, ANP         Signed, [name], MD PhD 01/08/2018 9:47 AM    [place]

## 2018-01-09 ENCOUNTER — Encounter (INDEPENDENT_AMBULATORY_CARE_PROVIDER_SITE_OTHER): Payer: Self-pay | Admitting: Orthopaedic Surgery

## 2018-01-09 ENCOUNTER — Encounter: Payer: Self-pay | Admitting: Pharmacist

## 2018-01-09 ENCOUNTER — Ambulatory Visit (INDEPENDENT_AMBULATORY_CARE_PROVIDER_SITE_OTHER): Payer: Medicare Other | Admitting: Orthopaedic Surgery

## 2018-01-09 ENCOUNTER — Ambulatory Visit (INDEPENDENT_AMBULATORY_CARE_PROVIDER_SITE_OTHER): Payer: Medicare Other | Admitting: Pharmacist

## 2018-01-09 VITALS — Ht 70.0 in | Wt >= 6400 oz

## 2018-01-09 DIAGNOSIS — M79672 Pain in left foot: Secondary | ICD-10-CM

## 2018-01-09 DIAGNOSIS — J449 Chronic obstructive pulmonary disease, unspecified: Secondary | ICD-10-CM | POA: Diagnosis not present

## 2018-01-09 DIAGNOSIS — M65341 Trigger finger, right ring finger: Secondary | ICD-10-CM | POA: Diagnosis not present

## 2018-01-09 DIAGNOSIS — Z87891 Personal history of nicotine dependence: Secondary | ICD-10-CM | POA: Diagnosis not present

## 2018-01-09 MED ORDER — BACLOFEN 10 MG PO TABS: 20.0000 mg | ORAL_TABLET | Freq: Three times a day (TID) | ORAL | 1 refills | Status: DC

## 2018-01-09 MED ORDER — ALBUTEROL SULFATE HFA 108 (90 BASE) MCG/ACT IN AERS: 2.0000 | INHALATION_SPRAY | Freq: Four times a day (QID) | RESPIRATORY_TRACT | 1 refills | Status: DC | PRN

## 2018-01-09 NOTE — Assessment & Plan Note (Signed)
History of tobacco abuse - reports 121 days of abstinence from tobacco.  Congratulated on success. Appears to be excellent candidate for long-term abstinence.

## 2018-01-09 NOTE — Assessment & Plan Note (Signed)
PFTs performed by pulmonology in 11/2017 indicate restriction. Patient reports continued dyspnea in spite of recent initiation of supplemental oxygen. Also reports she does not feel that neither the albuterol inhaler nor Trelegy inhaler have improved her lung function, though her albuterol inhaler expired in 05/2017. Educated patient that Trelegy is a triple combination of medicaitons Patient reports that she will have a follow up appointment with pulmonology in January to perform a CT scan and assess nodules present in both lungs.  New Rx of Albuterol sent to pharmacy.

## 2018-01-09 NOTE — Assessment & Plan Note (Signed)
Chronic back pain and muscle spasms - concerned about recurrence of symptoms as she has run out of baclofen. A new prescription sent for baclofen.

## 2018-01-09 NOTE — Patient Instructions (Addendum)
Great to see you today.   Continue to use your inhalers as prescribed- a new albuterol inhaler was sent to the pharmacy.   New prescription was sent for Baclofen today as a refill.   Follow up with Dr. Burr Medico in January.

## 2018-01-09 NOTE — Progress Notes (Signed)
Office Visit Note   Patient: [name]           Date of Birth: March 30, 1965           MRN: [id number] Visit Date: 01/09/2018              Requested by: [name], MD [address] [place], [place] [address] PCP: [name], MD   Assessment & Plan: Visit Diagnoses:  1. 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  Patient understands risks and benefits of surgery.  We will schedule her in the near future.  I would like to do this under Bier block if possible to minimize possible respiratory issues during anesthesia.  Follow-Up Instructions: Return if symptoms worsen or fail to improve.   Orders:  No orders of the defined types were placed in this encounter.  No orders of the defined types were placed in this encounter.     Procedures: No procedures performed   Clinical Data: No additional findings.   Subjective: Chief Complaint  Patient presents with  . Right Hand - Pain    Ring finger triggering     [name] comes in today for right ring trigger finger.  She is status post right middle and left middle and left ring trigger finger releases a couple years ago that I did.  She has done well from these.  She wants to have her right ring trigger finger released.  This is causing her significant pain and dysfunction with her hand.  She is on chronic oxygen.  Her diabetes is moderately well controlled   Review of Systems  Constitutional: Negative.   HENT: Negative.   Eyes: Negative.   Respiratory: Negative.   Cardiovascular: Negative.   Endocrine: Negative.   Musculoskeletal: Negative.   Neurological: Negative.   Hematological: Negative.   Psychiatric/Behavioral: Negative.   All other systems reviewed and are negative.    Objective: Vital Signs: Ht 5\' 10"  (1.778 m)   Wt (!) 412 lb (186.9 kg)   LMP 04/10/2006   BMI 59.12 kg/m   Physical Exam Vitals signs and nursing note  reviewed.  Constitutional:      Appearance: She is well-developed.  Pulmonary:     Effort: Pulmonary effort is normal.  Skin:    General: Skin is warm.     Capillary Refill: Capillary refill takes less than 2 seconds.  Neurological:     Mental Status: She is alert and oriented to person, place, and time.  Psychiatric:        Behavior: Behavior normal.        Thought Content: Thought content normal.        Judgment: Judgment normal.     Ortho Exam Right hand exam shows tenderness of the ring finger A1 pulley.  There is mild triggering with pain. Specialty Comments:  No specialty comments available.  Imaging: No results found.   PMFS History: Patient Active Problem List   Diagnosis Date Noted  . Syncope 11/04/2017  . Left foot pain 11/04/2017  . Open wound of left foot 11/04/2017  . Essential hypertension 10/27/2017  . Solitary pulmonary nodule 10/07/2017  . Acute on chronic congestive heart failure ([place])   . Acute on chronic right-sided congestive heart failure ([place])   . Acute on chronic respiratory failure with hypoxemia ([place])   . Leg edema, right 10/01/2017  . Restrictive lung disease secondary to obesity 10/01/2017  .  Polycythemia, secondary 10/01/2017  . Oxygen desaturation 09/30/2017  . Chronic viral hepatitis B without delta-agent ([place]) 07/08/2017  . COPD (chronic obstructive pulmonary disease) with chronic bronchitis ([place]) 06/27/2017  . Chronic kidney disease (CKD), stage IV (severe) ([place]) 01/14/2017  . Type 2 diabetes mellitus with stage 3 chronic kidney disease, with long-term current use of insulin ([place]) 12/12/2016  . Urge incontinence of urine 12/05/2016  . Class 3 severe obesity due to excess calories with serious comorbidity and body mass index (BMI) of 50.0 to 59.9 in adult ([place]) 10/02/2012  . Chronic diastolic heart failure ([place]) 04/07/2012  . GERD 01/26/2010  . Hypertension 08/04/2008  . OSA (obstructive sleep apnea) 02/03/2008  . FIBROCYSTIC BREAST  DISEASE 10/28/2006  . Hyperlipidemia 03/27/2006  . Obesity hypoventilation syndrome ([place]) 03/27/2006  . TOBACCO DEPENDENCE 03/27/2006  . Depression with anxiety 03/27/2006   Past Medical History:  Diagnosis Date  . Arthritis 04-10-11   hips, shoulders, back  . Asthma   . Bipolar disorder ([place])   . Cancer ([place]) 1993   cervical, no treatment done, went away per pt  . Cervical dysplasia or atypia 04-10-11   '93- once dx.-got pregnant-no intervention, then postpartum, no dysplasia found  . CHF (congestive heart failure) ([place])    no cardiologist 2014 dx, none now  . 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 Pathology showed Condyloma, gluteal CONDYLOMA ACUMINATUM.   [name] Kitchen Dyspnea    with actity - better since taking Singular  . Fall 12/16/2016  . Fibrocystic breast disease   . Hypercalcemia   . Hyperlipidemia   . Hypertension   . Migraine headache    none recent  . Morbid obesity ([place]) 03/27/2006  . Neuropathy   . Skin lesion 03/15/2011   In gluteal crease now s/p removal by Dr. [name] of General Surgery on 3/12. Path shows condyloma.     . Sleep apnea 04-10-11   uses cpap, pt does not know settings  . Trigger finger    left third    Family History  Problem Relation Age of Onset  . Stroke Father   . Cancer Maternal Aunt        breast  . Breast cancer Maternal Aunt   . Asthma Other   . COPD Other   . Hypertension Other   . Diabetes Other     Past Surgical History:  Procedure Laterality Date  . ANAL FISTULECTOMY  04/17/2011   Procedure: FISTULECTOMY ANAL;  Surgeon: Imogene Burn. [name], MD;  Location: [facility];  Service: General;  Laterality: N/A;  Excision of Condyloma Gluteal Cleft   . BACK SURGERY  04-10-11   x5-Lumbar fusion-retained hardware.(Dr. Louanne Skye)  . BREAST CYST EXCISION     bilateral breast, 3 cysts removed from each breast  . BREAST EXCISIONAL BIOPSY Right    x 3  . BREAST EXCISIONAL BIOPSY Left    x 3  . CARPAL TUNNEL RELEASE Bilateral   .  Cervical biospy    . COLONOSCOPY WITH PROPOFOL N/A 07/06/2015   Procedure: COLONOSCOPY WITH PROPOFOL;  Surgeon: [name], MD;  Location: [facility];  Service: Endoscopy;  Laterality: N/A;  . DOPPLER ECHOCARDIOGRAPHY  04/06/2012   AT [facility] 55-60%  . I&D EXTREMITY Left 12/18/2016   Procedure: IRRIGATION AND DEBRIDEMENT LEFT FOOT, CLOSURE;  Surgeon: [name], MD;  Location: [facility];  Service: Orthopedics;  Laterality: Left;  [name] Kitchen MULTIPLE TOOTH EXTRACTIONS    . NECK SURGERY  04-10-11   x3-  cervical fusion with plating and screws-Dr. [name]  . TEE WITHOUT CARDIOVERSION N/A 04/06/2012   Procedure: TRANSESOPHAGEAL ECHOCARDIOGRAM (TEE);  Surgeon: [name], MD;  Location: [facility];  Service: Cardiovascular;  Laterality: N/A;  . TRIGGER FINGER RELEASE Right    middle finger  . TRIGGER FINGER RELEASE Left 06/28/2016   Procedure: RELEASE TRIGGER FINGER LEFT 3RD FINGER;  Surgeon: [name], MD;  Location: [facility];  Service: Orthopedics;  Laterality: Left;  . TRIGGER FINGER RELEASE Right 06/12/2016   Procedure: RIGHT INDEX FINGER TRIGGER RELEASE;  Surgeon: [name], MD;  Location: [facility];  Service: Orthopedics;  Laterality: Right;   Social History   Occupational History  . Not on file  Tobacco Use  . Smoking status: Former Smoker    Packs/day: 1.00    Years: 39.00    Pack years: 39.00    Types: Cigarettes    Last attempt to quit: 09/07/2017    Years since quitting: 0.3  . Smokeless tobacco: Never Used  . Tobacco comment: Quit prior to hospitalization using low dose Chantix  Substance and Sexual Activity  . Alcohol use: No    Alcohol/week: 0.0 standard drinks  . Drug use: Not Currently    Types: Marijuana, "Crack" cocaine    Comment: hx marijuana usemany years ago, Crack -none since 11/2015  . Sexual activity: Yes    Partners: Male

## 2018-01-09 NOTE — Progress Notes (Signed)
   S:    Patient arrives in good spirits, ambulating with assistance of a 4 prong cane and carrying her oxygen - 2 liters per minutes.   Patient was last seen by Primary Care Provider on 01/07/2018.  Patient reports breathing has continued to be problematic with dyspnea leading to decreased activity/movement.   Patient reports adherence to medications Current COPD medications: Trelegy Ellipta (fluticasone-salmeterol-umeclidinium) DPI, Albuterol HFA Rescue inhaler use frequency: Minimal Patient exacerbation hx: None documented since 2014  O: Physical Exam Vitals signs reviewed.  Pulmonary:     Effort: Pulmonary effort is normal.     Review of Systems  Respiratory: Positive for shortness of breath.   All other systems reviewed and are negative.    Vitals:   01/09/18 1143  BP: (!) 146/74  Pulse: 86  SpO2: 98%    A/P: PFTs performed by pulmonology in 11/2017 indicate restriction. Patient reports continued dyspnea in spite of recent initiation of supplemental oxygen. Also reports she does not feel that neither the albuterol inhaler nor Trelegy inhaler have improved her lung function, though her albuterol inhaler expired in 05/2017. Educated patient that Trelegy is a triple combination of medicaitons Patient reports that she will have a follow up appointment with pulmonology in January to perform a CT scan and assess nodules present in both lungs.  New Rx of Albuterol sent to pharmacy.   History of tobacco abuse - reports 121 days of abstinence from tobacco.  Congratulated on success. Appears to be excellent candidate for long-term abstinence.   Chronic back pain and muscle spasms - concerned about recurrence of symptoms as she has run out of baclofen. A new prescription sent for baclofen.    Written pt instructions provided.  F/U Clinic visit with PCP in January.   Total time in face to face counseling 25 minutes.  Patient seen with Brett Fairy, PharmD.

## 2018-01-10 ENCOUNTER — Other Ambulatory Visit: Payer: Self-pay | Admitting: Family Medicine

## 2018-01-10 DIAGNOSIS — Z794 Long term (current) use of insulin: Secondary | ICD-10-CM

## 2018-01-10 DIAGNOSIS — E1122 Type 2 diabetes mellitus with diabetic chronic kidney disease: Secondary | ICD-10-CM

## 2018-01-10 DIAGNOSIS — N183 Chronic kidney disease, stage 3 unspecified: Secondary | ICD-10-CM

## 2018-01-11 ENCOUNTER — Other Ambulatory Visit: Payer: Self-pay | Admitting: Student in an Organized Health Care Education/Training Program

## 2018-01-11 DIAGNOSIS — G4733 Obstructive sleep apnea (adult) (pediatric): Secondary | ICD-10-CM | POA: Diagnosis not present

## 2018-01-12 NOTE — Progress Notes (Signed)
Patient ID: [name], female   DOB: [birth year], 52 y.o.   MRN: [id number] Reviewed: Agree with the documentation and management of Dr. Valentina Lucks.

## 2018-01-14 ENCOUNTER — Encounter: Payer: Self-pay | Admitting: Student in an Organized Health Care Education/Training Program

## 2018-01-14 NOTE — Pre-Procedure Instructions (Signed)
[name]  [birth year]      [id number] - [place], [place] [address] [place] [address] Phone: [phone] Fax: [phone]    Your procedure is scheduled on December 23rd.  Report to [facility] Admitting at 0830 A.M.  Call this number if you have problems the morning of surgery:  [phone]   Remember:  Do not eat or drink after midnight.    Take these medicines the morning of surgery with A SIP OF WATER  albuterol (PROAIR HFA) if needed. Bring inhaler with you allopurinol (ZYLOPRIM) atorvastatin (LIPITOR) baclofen (LIORESAL) busPIRone (BUSPAR)  citalopram (CELEXA)  diltiazem (CARDIZEM CD) docusate sodium (COLACE)   fluticasone (FLONASE) gabapentin (NEURONTIN) loratadine (CLARITIN)  omeprazole (PRILOSEC) TRELEGY ELLIPTA 100-62.5-25 MCG/INH AEPB  7 days prior to surgery STOP taking any diclofenac sodium (VOLTAREN), Aspirin(unless otherwise instructed by your surgeon), Aleve, Naproxen, Ibuprofen, Motrin, Advil, Goody's, BC's, all herbal medications, fish oil, and all vitamins   WHAT DO I DO ABOUT MY DIABETES MEDICATION?   [name] Kitchen Do not take oral diabetes medicines (pills) the morning of surgery: JARDIANCE.  . THE NIGHT BEFORE SURGERY, take 5 units of Insulin Glargine (LANTUS SOLOSTAR).     . The day of surgery, do not take other diabetes injectables, including Byetta (exenatide), Bydureon (exenatide ER), Victoza (liraglutide), or Trulicity (dulaglutide).  . If your CBG is greater than 220 mg/dL, you may take  of your sliding scale (correction) dose of insulin.   How to Manage Your Diabetes Before and After Surgery  Why is it important to control my blood sugar before and after surgery? . Improving blood sugar levels before and after surgery helps healing and can limit problems. . A way of improving blood sugar control is eating a healthy diet by: o  Eating less  sugar and carbohydrates o  Increasing activity/exercise o  Talking with your doctor about reaching your blood sugar goals . High blood sugars (greater than 180 mg/dL) can raise your risk of infections and slow your recovery, so you will need to focus on controlling your diabetes during the weeks before surgery. . Make sure that the doctor who takes care of your diabetes knows about your planned surgery including the date and location.  How do I manage my blood sugar before surgery? . Check your blood sugar at least 4 times a day, starting 2 days before surgery, to make sure that the level is not too high or low. o Check your blood sugar the morning of your surgery when you wake up and every 2 hours until you get to the Short Stay unit. . If your blood sugar is less than 70 mg/dL, you will need to treat for low blood sugar: o Do not take insulin. o Treat a low blood sugar (less than 70 mg/dL) with  cup of clear juice (cranberry or apple), 4 glucose tablets, OR glucose gel. o Recheck blood sugar in 15 minutes after treatment (to make sure it is greater than 70 mg/dL). If your blood sugar is not greater than 70 mg/dL on recheck, call [phone] for further instructions. . Report your blood sugar to the short stay nurse when you get to Short Stay.  . If you are admitted to the hospital after surgery: o Your blood sugar will be checked by the staff and you will probably be given insulin after surgery (instead of oral diabetes medicines) to make sure you  have good blood sugar levels. o The goal for blood sugar control after surgery is 80-180 mg/dL.    Do not wear jewelry, make-up or nail polish.  Do not wear lotions, powders, or perfumes, or deodorant.  Do not shave 48 hours prior to surgery.  Men may shave face and neck.  Do not bring valuables to the hospital.  [facility] is not responsible for any belongings or valuables.  Contacts, dentures or bridgework may not be worn into surgery.   Leave your suitcase in the car.  After surgery it may be brought to your room.  For patients admitted to the hospital, discharge time will be determined by your treatment team.  Patients discharged the day of surgery will not be allowed to drive home.    [facility]- Preparing For Surgery  Before surgery, you can play an important role. Because skin is not sterile, your skin needs to be as free of germs as possible. You can reduce the number of germs on your skin by washing with CHG (chlorahexidine gluconate) Soap before surgery.  CHG is an antiseptic cleaner which kills germs and bonds with the skin to continue killing germs even after washing.    Oral Hygiene is also important to reduce your risk of infection.  Remember - BRUSH YOUR TEETH THE MORNING OF SURGERY WITH YOUR REGULAR TOOTHPASTE  Please do not use if you have an allergy to CHG or antibacterial soaps. If your skin becomes reddened/irritated stop using the CHG.  Do not shave (including legs and underarms) for at least 48 hours prior to first CHG shower. It is OK to shave your face.  Please follow these instructions carefully.   1. Shower the NIGHT BEFORE SURGERY and the MORNING OF SURGERY with CHG.   2. If you chose to wash your hair, wash your hair first as usual with your normal shampoo.  3. After you shampoo, rinse your hair and body thoroughly to remove the shampoo.  4. Use CHG as you would any other liquid soap. You can apply CHG directly to the skin and wash gently with a scrungie or a clean washcloth.   5. Apply the CHG Soap to your body ONLY FROM THE NECK DOWN.  Do not use on open wounds or open sores. Avoid contact with your eyes, ears, mouth and genitals (private parts). Wash Face and genitals (private parts)  with your normal soap.  6. Wash thoroughly, paying special attention to the area where your surgery will be performed.  7. Thoroughly rinse your body with warm water from the neck down.  8. DO NOT  shower/wash with your normal soap after using and rinsing off the CHG Soap.  9. Pat yourself dry with a CLEAN TOWEL.  10. Wear CLEAN PAJAMAS to bed the night before surgery, wear comfortable clothes the morning of surgery  11. Place CLEAN SHEETS on your bed the night of your first shower and DO NOT SLEEP WITH PETS.    Day of Surgery:  Do not apply any deodorants/lotions.  Please wear clean clothes to the hospital/surgery center.   Remember to brush your teeth WITH YOUR REGULAR TOOTHPASTE.    Please read over the following fact sheets that you were given.

## 2018-01-15 ENCOUNTER — Telehealth: Payer: Self-pay | Admitting: *Deleted

## 2018-01-15 ENCOUNTER — Encounter (HOSPITAL_COMMUNITY)
Admission: RE | Admit: 2018-01-15 | Discharge: 2018-01-15 | Disposition: A | Discharge status: Home / Self Care | Payer: Medicare Other | Source: Ambulatory Visit | Attending: Orthopaedic Surgery | Admitting: Orthopaedic Surgery

## 2018-01-15 ENCOUNTER — Encounter: Payer: Medicare Other | Attending: General Surgery | Admitting: Dietician

## 2018-01-15 ENCOUNTER — Encounter (HOSPITAL_COMMUNITY): Payer: Self-pay

## 2018-01-15 VITALS — Ht 68.5 in | Wt >= 6400 oz

## 2018-01-15 DIAGNOSIS — E669 Obesity, unspecified: Secondary | ICD-10-CM | POA: Insufficient documentation

## 2018-01-15 DIAGNOSIS — Z794 Long term (current) use of insulin: Secondary | ICD-10-CM | POA: Diagnosis not present

## 2018-01-15 DIAGNOSIS — E785 Hyperlipidemia, unspecified: Secondary | ICD-10-CM | POA: Insufficient documentation

## 2018-01-15 DIAGNOSIS — Z713 Dietary counseling and surveillance: Secondary | ICD-10-CM | POA: Diagnosis not present

## 2018-01-15 DIAGNOSIS — E1122 Type 2 diabetes mellitus with diabetic chronic kidney disease: Secondary | ICD-10-CM | POA: Insufficient documentation

## 2018-01-15 DIAGNOSIS — Z01812 Encounter for preprocedural laboratory examination: Secondary | ICD-10-CM | POA: Insufficient documentation

## 2018-01-15 DIAGNOSIS — K219 Gastro-esophageal reflux disease without esophagitis: Secondary | ICD-10-CM | POA: Diagnosis not present

## 2018-01-15 DIAGNOSIS — J449 Chronic obstructive pulmonary disease, unspecified: Secondary | ICD-10-CM | POA: Diagnosis not present

## 2018-01-15 DIAGNOSIS — I129 Hypertensive chronic kidney disease with stage 1 through stage 4 chronic kidney disease, or unspecified chronic kidney disease: Secondary | ICD-10-CM | POA: Insufficient documentation

## 2018-01-15 DIAGNOSIS — N184 Chronic kidney disease, stage 4 (severe): Secondary | ICD-10-CM | POA: Diagnosis not present

## 2018-01-15 DIAGNOSIS — F418 Other specified anxiety disorders: Secondary | ICD-10-CM | POA: Diagnosis not present

## 2018-01-15 DIAGNOSIS — Z6841 Body Mass Index (BMI) 40.0 and over, adult: Secondary | ICD-10-CM | POA: Diagnosis not present

## 2018-01-15 DIAGNOSIS — N183 Chronic kidney disease, stage 3 (moderate): Secondary | ICD-10-CM | POA: Insufficient documentation

## 2018-01-15 HISTORY — DX: Inflammatory liver disease, unspecified: K75.9

## 2018-01-15 HISTORY — DX: Pneumonia, unspecified organism: J18.9

## 2018-01-15 HISTORY — DX: Chronic kidney disease, unspecified: N18.9

## 2018-01-15 HISTORY — DX: Solitary pulmonary nodule: R91.1

## 2018-01-15 HISTORY — DX: Anxiety disorder, unspecified: F41.9

## 2018-01-15 LAB — CBC
HEMATOCRIT: 49.1 % — AB (ref 36.0–46.0)
Hemoglobin: 16.3 g/dL — ABNORMAL HIGH (ref 12.0–15.0)
MCH: 26.1 pg (ref 26.0–34.0)
MCHC: 33.2 g/dL (ref 30.0–36.0)
MCV: 78.7 fL — ABNORMAL LOW (ref 80.0–100.0)
Platelets: 381 10*3/uL (ref 150–400)
RBC: 6.24 MIL/uL — AB (ref 3.87–5.11)
RDW: 17.1 % — ABNORMAL HIGH (ref 11.5–15.5)
WBC: 11.7 10*3/uL — ABNORMAL HIGH (ref 4.0–10.5)
nRBC: 0 % (ref 0.0–0.2)

## 2018-01-15 LAB — COMPREHENSIVE METABOLIC PANEL
ALT: 28 U/L (ref 0–44)
ANION GAP: 13 (ref 5–15)
AST: 24 U/L (ref 15–41)
Albumin: 3.5 g/dL (ref 3.5–5.0)
Alkaline Phosphatase: 86 U/L (ref 38–126)
BUN: 13 mg/dL (ref 6–20)
CO2: 23 mmol/L (ref 22–32)
Calcium: 8.9 mg/dL (ref 8.9–10.3)
Chloride: 106 mmol/L (ref 98–111)
Creatinine, Ser: 1.24 mg/dL — ABNORMAL HIGH (ref 0.44–1.00)
GFR calc Af Amer: 57 mL/min — ABNORMAL LOW (ref >60)
GFR calc non Af Amer: 49 mL/min — ABNORMAL LOW (ref >60)
Glucose, Bld: 110 mg/dL — ABNORMAL HIGH (ref 70–99)
Potassium: 3.8 mmol/L (ref 3.5–5.1)
SODIUM: 142 mmol/L (ref 135–145)
Total Bilirubin: UNDETERMINED mg/dL (ref 0.3–1.2)
Total Protein: 7.4 g/dL (ref 6.5–8.1)

## 2018-01-15 LAB — GLUCOSE, CAPILLARY: Glucose-Capillary: 116 mg/dL — ABNORMAL HIGH (ref 70–99)

## 2018-01-15 NOTE — Progress Notes (Addendum)
PCP: Lindell Noe Cardiologist: [name] Nephrology: Dr. Carolin Sicks @ [facility] Pulmonlogist: Dr. Valeta Harms  Fasting sugars: 114-140  Sleep study done 10 yrs. Ago, is schedule for study on 01-29-18  Main lab called stated not enough blood to run the total bilirubin. Notified Jmes Burns PA, stated it was okay. Doesn't need to be redrawn day of surgery.

## 2018-01-15 NOTE — Telephone Encounter (Signed)
   Primary Cardiologist: [name], MD  Chart reviewed as part of pre-operative protocol coverage. Given past medical history and time since last visit, based on ACC/AHA guidelines, [name] would be at acceptable risk for the planned procedure without further cardiovascular testing.   I will route this recommendation to the requesting party via Epic fax function and remove from pre-op pool.  Please call with questions.  Lyda Jester, PA-C 01/15/2018, 4:50 PM

## 2018-01-15 NOTE — Patient Instructions (Signed)
.   Track food and beverage intake (try MyFitness Pal or the Baritastic app) . Make healthy food choices . Avoid concentrated sugars and fried foods . Keep fat & sugar in the single digits per serving on food labels . Look for a liquid protein source that contains ?15 g protein and ?5 g carbohydrate (ex: shakes, drinks, shots)

## 2018-01-15 NOTE — Telephone Encounter (Signed)
   [facility] Pre-operative Risk Assessment    Request for surgical clearance:  1. What type of surgery is being performed? Right ring finger trigger release   2. When is this surgery scheduled? 01/19/18   3. What type of clearance is required (medical clearance vs. Pharmacy clearance to hold med vs. Both)? Medical  4. Are there any medications that need to be held prior to surgery and how long? none noted   5. Practice name and name of physician performing surgery? [facility].    6. What is your office phone number [phone]    7.   What is your office fax number [phone]  8.   Anesthesia type (None, local, MAC, general) ? Bier Block    _________________________________________________________________   (provider comments below)

## 2018-01-15 NOTE — Progress Notes (Signed)
Bariatric Pre-Op Nutrition Assessment for Planned RYGB Surgery Medical Nutrition Therapy  Appt Start Time: 10:00am  End time: 11:00am  Patient was seen on 01/15/2018 for Pre-Operative Nutrition Assessment. Assessment and letter of approval faxed to [facility] Bariatric Surgery Program coordinator on 01/15/2018.   Pt expectation of surgery: To lose weight and improve health (come off oxygen, be able to walk easily, improved quality of life, be able to swim, etc.)  Pt expectation of dietitian: To learn how to eat properly.   Pt arrived to visit with limited oxygen left in her tank so appointment was condensed. Pt required wheelchair for transportation from the office to her vehicle.  Anthropometrics  Start weight at [facility]: 405.6 lbs Height: 68.5 in BMI: 60.8 kg/m2    Clinical  Medical Hx: obesity, type 2 diabetes, heart failure, asthma, obesity hypoventilation syndrome, COPD, GERD, CKD stage 4, hyperlipidemia, depression with anxiety, HTN Surgeries: back surgery (5x), neck surgery (3x), left foot surgery Medications: see list  Allergies: chantix, methadone Hcl, levofloxacin   Psychosocial/Lifestyle Pt lives alone, and her son performs the food shopping for her. Pt's ability to walk/move is restricted dt health conditions. Pt is talkative.   24-Hr Dietary Recall First Meal: Slim Fast  Snack: Walnuts Second Meal: Chef Salad + Ranch Dressing Snack: Catheryn Bacon Third Meal: Pasta Salad with Shrimp + Crackers  Snack: Popcorn or Ice Cream Delight Ice Cream (sugar free)  Beverages: water, Powerade Zero, Gatorade Zero, almond milk, Kool-Aid with 1/2 sugar, Crystal Light packets   Food & Nutrition Related Hx Dietary Hx: Pt states she eats lots of shrimp, fish, and chicken wings. Pt states she eats a lot of rice (espcially yellow rice), pasta salad with crackers, and noodles. Pt states she likes carrots, broccoli, cauliflower, string beans, corn, and chef salads with ranch  dressing. Pt states she drinks almond milk. Pt states she uses sea salt, but limits her intake. Pt states she eats graham crackers, but limits her sugar/sweets intake.  Pt orders out once per week. Pt states she eats shrimp lo mein or fried shrimp and rice with an egg roll.  Pt states she uses a food scale to help learn proper portion sizes. Pt states she has a pressure cooker she likes to use.  Supplements: MVI Estimated Daily Fluid Intake: 30 oz water + other beverages GI / Other Notable Symptoms: none   Physical Activity  Current average weekly physical activity: none  Estimated Energy Needs Calories: 1600 Carbohydrate: 180g Protein: 120g Fat: 44g  Pre-Op Goals Reviewed with the Patient . Track food and beverage intake (try MyFitness Pal or the Baritastic app) . Make healthy food choices . Avoid concentrated sugars and fried foods . Keep fat & sugar in the single digits per serving on food labels . Practice CHEWING your food (aim for applesauce consistency) . Practice not drinking 15 minutes before, during, and 30 minutes after each meal and snack . Avoid all carbonated beverages (ex: soda, sparkling beverages)  . Limit caffeinated beverages (ex: coffee, tea, energy drinks) . Avoid all sugar-sweetened beverages (ex: regular soda, sports drinks)  . Avoid alcohol  . Consume 3 meals per day or try to eat every 3-5 hours . Make a list of non-food related activities . Aim for 64-100 ounces of FLUID daily (with at least half of fluid intake being plain water)  . Aim for at least 60-80 grams of PROTEIN daily . Look for a liquid protein source that contains ?15 g protein and ?5 g  carbohydrate (ex: shakes, drinks, shots) . Physical activity is an important part of a healthy lifestyle so keep it moving! The goal is to reach 150 minutes of exercise per week, including cardiovascular and weight baring activity.  *Goals that are bolded are the only goals covered at today's appointment.    Handouts Provided Include  . Pre-Op Goals . Bariatric Surgery Protein Shakes . MyPlate  Learning Style & Readiness for Change Teaching method utilized: Visual & Auditory  Demonstrated degree of understanding via: Teach Back  Barriers to learning/adherence to lifestyle change: multiple health conditions limit ability to be active  RD's Notes for Next Visit . Review Pre-Op Goals sheet, specifically goals that are bolded.  . Provide Bariatric Vitamins and Minerals sheet. . Review healthy meals (MyPlate) and relate to diabetes/managing blood sugar.  . Continue working through [employer].   Next Steps Supervised Weight Loss (SWL) Visits Needed: 5  Patient is to return to [facility] in 1 months for 1st SWL Visit.

## 2018-01-15 NOTE — Pre-Procedure Instructions (Signed)
SHERRYE PUGA  [birth year]      [id number] - [place], [place] [address]  [place] [address] Phone: [phone] Fax: [phone]    Your procedure is scheduled on December 23rd.  Report to [facility] Admitting at 0830 A.M.  Call this number if you have problems the morning of surgery:  [phone]   Remember:  Do not eat or drink after midnight.    Take these medicines the morning of surgery with A SIP OF WATER  albuterol (PROAIR HFA) if needed. Bring inhaler with you allopurinol (ZYLOPRIM)  baclofen (LIORESAL) busPIRone (BUSPAR)  citalopram (CELEXA)  diltiazem (CARDIZEM CD) docusate sodium (COLACE)   fluticasone (FLONASE) gabapentin (NEURONTIN) loratadine (CLARITIN)  omeprazole (PRILOSEC) TRELEGY ELLIPTA 100-62.5-25 MCG/INH AEPB  7 days prior to surgery STOP taking any diclofenac sodium (VOLTAREN), Aspirin(unless otherwise instructed by your surgeon), Aleve, Naproxen, Ibuprofen, Motrin, Advil, Goody's, BC's, all herbal medications, fish oil, and all vitamins   WHAT DO I DO ABOUT MY DIABETES MEDICATION?   [name] Kitchen Do not take oral diabetes medicines (pills) the morning of surgery: JARDIANCE.   . THE DAY OF SURGERY, take 5 units of Insulin Glargine (LANTUS SOLOSTAR).      . If your CBG is greater than 220 mg/dL, you may take  of your sliding scale (correction) dose of insulin.   How to Manage Your Diabetes Before and After Surgery  Why is it important to control my blood sugar before and after surgery? . Improving blood sugar levels before and after surgery helps healing and can limit problems. . A way of improving blood sugar control is eating a healthy diet by: o  Eating less sugar and carbohydrates o  Increasing activity/exercise o  Talking with your doctor about reaching your blood sugar goals . High blood sugars (greater than 180 mg/dL) can raise your risk of  infections and slow your recovery, so you will need to focus on controlling your diabetes during the weeks before surgery. . Make sure that the doctor who takes care of your diabetes knows about your planned surgery including the date and location.  How do I manage my blood sugar before surgery? . Check your blood sugar at least 4 times a day, starting 2 days before surgery, to make sure that the level is not too high or low. o Check your blood sugar the morning of your surgery when you wake up and every 2 hours until you get to the Short Stay unit. . If your blood sugar is less than 70 mg/dL, you will need to treat for low blood sugar: o Do not take insulin. o Treat a low blood sugar (less than 70 mg/dL) with  cup of clear juice (cranberry or apple), 4 glucose tablets, OR glucose gel. o Recheck blood sugar in 15 minutes after treatment (to make sure it is greater than 70 mg/dL). If your blood sugar is not greater than 70 mg/dL on recheck, call [phone] for further instructions. . Report your blood sugar to the short stay nurse when you get to Short Stay.  . If you are admitted to the hospital after surgery: o Your blood sugar will be checked by the staff and you will probably be given insulin after surgery (instead of oral diabetes medicines) to make sure you have good blood sugar levels. o The goal for blood sugar control after surgery is 80-180 mg/dL.    Do not  wear jewelry, make-up or nail polish.  Do not wear lotions, powders, or perfumes, or deodorant.  Do not shave 48 hours prior to surgery.  Men may shave face and neck.  Do not bring valuables to the hospital.  [facility] is not responsible for any belongings or valuables.  Contacts, dentures or bridgework may not be worn into surgery.  Leave your suitcase in the car.  After surgery it may be brought to your room.  For patients admitted to the hospital, discharge time will be determined by your treatment team.  Patients  discharged the day of surgery will not be allowed to drive home.    [place]- Preparing For Surgery  Before surgery, you can play an important role. Because skin is not sterile, your skin needs to be as free of germs as possible. You can reduce the number of germs on your skin by washing with CHG (chlorahexidine gluconate) Soap before surgery.  CHG is an antiseptic cleaner which kills germs and bonds with the skin to continue killing germs even after washing.    Oral Hygiene is also important to reduce your risk of infection.  Remember - BRUSH YOUR TEETH THE MORNING OF SURGERY WITH YOUR REGULAR TOOTHPASTE  Please do not use if you have an allergy to CHG or antibacterial soaps. If your skin becomes reddened/irritated stop using the CHG.  Do not shave (including legs and underarms) for at least 48 hours prior to first CHG shower. It is OK to shave your face.  Please follow these instructions carefully.   1. Shower the NIGHT BEFORE SURGERY and the MORNING OF SURGERY with CHG.   2. If you chose to wash your hair, wash your hair first as usual with your normal shampoo.  3. After you shampoo, rinse your hair and body thoroughly to remove the shampoo.  4. Use CHG as you would any other liquid soap. You can apply CHG directly to the skin and wash gently with a scrungie or a clean washcloth.   5. Apply the CHG Soap to your body ONLY FROM THE NECK DOWN.  Do not use on open wounds or open sores. Avoid contact with your eyes, ears, mouth and genitals (private parts). Wash Face and genitals (private parts)  with your normal soap.  6. Wash thoroughly, paying special attention to the area where your surgery will be performed.  7. Thoroughly rinse your body with warm water from the neck down.  8. DO NOT shower/wash with your normal soap after using and rinsing off the CHG Soap.  9. Pat yourself dry with a CLEAN TOWEL.  10. Wear CLEAN PAJAMAS to bed the night before surgery, wear comfortable  clothes the morning of surgery  11. Place CLEAN SHEETS on your bed the night of your first shower and DO NOT SLEEP WITH PETS.    Day of Surgery:  Do not apply any deodorants/lotions.  Please wear clean clothes to the hospital/surgery center.   Remember to brush your teeth WITH YOUR REGULAR TOOTHPASTE.    Please read over the following fact sheets that you were given.

## 2018-01-16 MED ORDER — DEXTROSE 5 % IV SOLN
3.0000 g | INTRAVENOUS | Status: DC
  Filled 2018-01-16: qty 3000

## 2018-01-16 NOTE — Progress Notes (Signed)
Anesthesia Chart Review:  Case:  [id number] Date/Time:  01/19/18 1006   Procedure:  RIGHT RING FINGER TRIGGER FINGER RELEASE (Right )   Anesthesia type:  Regional   Pre-op diagnosis:  right ring finger trigger finger   Location:  [facility] OR ROOM 04 / [place] [facility]   Surgeon:  [name], MD      DISCUSSION: 52 yo female former smoker. Pertinent hx includes COPD (on 2L continuous O2; CPAP QHS), HTN, Bipolar, HFpEF, IDDM, CKD III, Hx of Hep B, Lung nodule being monitored by pulm, Morbid obesity BMI 60.  I spoke with the pt at her PAT appt to clarify her COPD history. She reports being started on continuous O2 2L via [place] 11/07/2017. She reports improvement in her SOB since starting oxygen. She also states that since being started on diltiazem for SVT she has fewer episodes of feeling SOB. History of HFpEF managed by cardiology. She reports no recent leg swelling. Difficult to assess functional capacity as she is severely limited by both DOE and back pain. Uses wheelchair, does not walk more than about 10-20 feet unassisted. On exam today she has very mild dependent LE edema. No dyspnea at rest. Lung sounds diminished but clear. Heart sounds distant, RRR.   Last seen by cardiology, Dr. Harrell Gave, 01/08/18. Per her note the pt was found to have frequent SVT on monitoring and started on diltiazem, Bblocker avoided due to COPD.  She has cardiac clearance 01/15/18 per telephone encounter by Lyda Jester, PA-C.  Pulmonary clearance by Dr. Valeta Harms states "No PFTs for review, only seen once. Should be able to tolerate from a pulmonary standpoint."   Anticipate she can proceed as planned barring acute status change.  VS: BP (!) 159/76   Pulse 84   Temp 36.8 C   Resp 20   Ht '5\' 10"'  (1.778 m)   Wt (!) 184.2 kg   LMP 04/10/2006   SpO2 94%   BMI 58.25 kg/m   PROVIDERS: [name], MD is PCP  [name], MD is Cardiologist  June Leap, MD is Pulmonologist  LABS: Labs reviewed:  Acceptable for surgery. (all labs ordered are listed, but only abnormal results are displayed)  Labs Reviewed  GLUCOSE, CAPILLARY - Abnormal; Notable for the following components:      Result Value   Glucose-Capillary 116 (*)    All other components within normal limits  COMPREHENSIVE METABOLIC PANEL - Abnormal; Notable for the following components:   Glucose, Bld 110 (*)    Creatinine, Ser 1.24 (*)    GFR calc non Af Amer 49 (*)    GFR calc Af Amer 57 (*)    All other components within normal limits  CBC - Abnormal; Notable for the following components:   WBC 11.7 (*)    RBC 6.24 (*)    Hemoglobin 16.3 (*)    HCT 49.1 (*)    MCV 78.7 (*)    RDW 17.1 (*)    All other components within normal limits     IMAGES: CT Chest 10/01/2017: IMPRESSION: 1. No evidence of pulmonary edema. 2. Small areas of peripheral opacity in the lower lobes which may reflect atelectasis pneumonia or a combination. 3. 6 mm ill-defined nodule in the right upper lobe. Smaller irregular and denser nodule in the left upper lobe measuring 4 mm most likely scarring. Non-contrast chest CT at 6-12 months is recommended. If the nodule is stable at time of repeat CT, then future CT at 18-24 months (from today's  scan) is considered optional for low-risk patients, but is recommended for high-risk patients. This recommendation follows the consensus statement: Guidelines for Management of Incidental Pulmonary Nodules Detected on CT Images: From the Fleischner Society 2017; Radiology 2017; 284:228-243.  EKG: 11/21/2017: Sinus tachycardia with PACs in a pattern of bigeminy.  CV: Zio patch monitor 12/02/2017: 3 days of rhythm interpreted on Zio patch. Patient had a min HR of 58 bpm, max HR of 193 bpm, and avg HR of 81 bpm. Predominant underlying rhythm was Sinus Rhythm. 217 Supraventricular Tachycardia runs occurred, the run with the fastest interval lasting 6 beats with a max rate of 193 bpm, the longest lasting 1  min 13 secs with an avg rate of 151 bpm. True duration of Supraventricular Tachycardia difficult to ascertain due to Artifact. Thirteen triggered patient events recorded. Supraventricular Tachycardia was detected within +/- 45 seconds of symptomatic patient event(s). Isolated SVEs were occasional (2.0%, 6813), SVE Couplets were rare (<1.0%, 235), and SVE Triplets were rare (<1.0%, 66). No significant ventricular ectopy noted.  Lexiscan stress 11/11/2017:  Nuclear stress EF: 71%.  The left ventricular ejection fraction is hyperdynamic (>65%).  There was no ST segment deviation noted during stress.  There is a small defect of mild severity present in the apical septal location. The defect is non-reversible. No ischemia noted.  This is a low risk study.  TTE 10/01/2017:  Study Conclusions  - Left ventricle: The cavity size was normal. Wall thickness was   normal. Systolic function was normal. The estimated ejection   fraction was in the range of 55% to 60%. Wall motion was normal;   there were no regional wall motion abnormalities. Doppler   parameters are consistent with abnormal left ventricular   relaxation (grade 1 diastolic dysfunction).  Past Medical History:  Diagnosis Date  . Anxiety   . Arthritis 04-10-11   hips, shoulders, back  . Asthma   . Bipolar disorder ([place])   . Cancer ([place]) 1993   cervical, no treatment done, went away per pt  . Cervical dysplasia or atypia 04-10-11   '93- once dx.-got pregnant-no intervention, then postpartum, no dysplasia found  . CHF (congestive heart failure) ([place])    no cardiologist 2014 dx, none now  . Chronic kidney disease    stage 3 kidney disease  . 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 Pathology showed Condyloma, gluteal CONDYLOMA ACUMINATUM.   [name] Kitchen COPD (chronic obstructive pulmonary disease) ([place])   . Diabetes mellitus without complication ([place])   . Dyspnea    with actity, sitting  . Fall 12/16/2016  .  Fibrocystic breast disease   . GERD (gastroesophageal reflux disease)   . Hepatitis    hep B-count is low at present,doesn't register  . Hypercalcemia   . Hyperlipidemia   . Hypertension   . Lung nodule 11/2017   right and left lung  . Migraine headache    none recent  . Morbid obesity ([place]) 03/27/2006  . Neuropathy   . Pneumonia    walking pneu 15 yrs. ago  . Skin lesion 03/15/2011   In gluteal crease now s/p removal by Dr. [name] of General Surgery on 3/12. Path shows condyloma.     . Sleep apnea 04-10-11   uses cpap, pt does not know settings  . Trigger finger    left third    Past Surgical History:  Procedure Laterality Date  . ANAL FISTULECTOMY  04/17/2011   Procedure: FISTULECTOMY ANAL;  Surgeon: Imogene Burn.  [name], MD;  Location: [facility] ORS;  Service: General;  Laterality: N/A;  Excision of Condyloma Gluteal Cleft   . BACK SURGERY  04-10-11   x5-Lumbar fusion-retained hardware.(Dr. Louanne Skye)  . BREAST CYST EXCISION     bilateral breast, 3 cysts removed from each breast  . BREAST EXCISIONAL BIOPSY Right    x 3  . BREAST EXCISIONAL BIOPSY Left    x 3  . CARPAL TUNNEL RELEASE Bilateral   . Cervical biospy    . COLONOSCOPY WITH PROPOFOL N/A 07/06/2015   Procedure: COLONOSCOPY WITH PROPOFOL;  Surgeon: [name], MD;  Location: [facility] END[facility]COPY;  Service: Endoscopy;  Laterality: N/A;  . DOPPLER ECHOCARDIOGRAPHY  04/06/2012   AT [place] 55-60%  . I&D EXTREMITY Left 12/18/2016   Procedure: IRRIGATION AND DEBRIDEMENT LEFT FOOT, CLOSURE;  Surgeon: [name], MD;  Location: [place];  Service: Orthopedics;  Laterality: Left;  [name] Kitchen MULTIPLE TOOTH EXTRACTIONS    . NECK SURGERY  04-10-11   x3- cervical fusion with plating and screws-Dr. [name]  . TEE WITHOUT CARDIOVERSION N/A 04/06/2012   Procedure: TRANSESOPHAGEAL ECHOCARDIOGRAM (TEE);  Surgeon: [name], MD;  Location: [facility]COPY;  Service: Cardiovascular;  Laterality: N/A;  . TRIGGER FINGER RELEASE Right    middle finger  .  TRIGGER FINGER RELEASE Left 06/28/2016   Procedure: RELEASE TRIGGER FINGER LEFT 3RD FINGER;  Surgeon: [name], MD;  Location: [place];  Service: Orthopedics;  Laterality: Left;  . TRIGGER FINGER RELEASE Right 06/12/2016   Procedure: RIGHT INDEX FINGER TRIGGER RELEASE;  Surgeon: [name], MD;  Location: [place];  Service: Orthopedics;  Laterality: Right;    MEDICATIONS: . ACCU-CHEK AVIVA PLUS test strip  . ACCU-CHEK SOFTCLIX LANCETS lancets  . albuterol (PROAIR HFA) 108 (90 Base) MCG/ACT inhaler  . allopurinol (ZYLOPRIM) 100 MG tablet  . ammonium lactate (AMLACTIN) 12 % lotion  . atorvastatin (LIPITOR) 40 MG tablet  . baclofen (LIORESAL) 10 MG tablet  . blood glucose meter kit and supplies KIT  . busPIRone (BUSPAR) 10 MG tablet  . Capsaicin-Menthol (PAIN RELIEF EX)  . citalopram (CELEXA) 40 MG tablet  . diclofenac sodium (VOLTAREN) 1 % GEL  . diltiazem (CARDIZEM CD) 120 MG 24 hr capsule  . docusate sodium (COLACE) 100 MG capsule  . famotidine (PEPCID) 10 MG tablet  . fluticasone (FLONASE) 50 MCG/ACT nasal spray  . furosemide (LASIX) 40 MG tablet  . gabapentin (NEURONTIN) 300 MG capsule  . HYDROcodone-acetaminophen (NORCO) 7.5-325 MG tablet  . hydrOXYzine (VISTARIL) 100 MG capsule  . Insulin Glargine (LANTUS SOLOSTAR) 100 UNIT/ML Solostar Pen  . insulin lispro (HUMALOG KWIKPEN) 100 UNIT/ML KiwkPen  . Insulin Pen Needle (B-D UF III MINI PEN NEEDLES) 31G X 5 MM MISC  . JARDIANCE 25 MG TABS tablet  . ketoconazole (NIZORAL) 2 % cream  . loratadine (CLARITIN) 10 MG tablet  . losartan (COZAAR) 100 MG tablet  . montelukast (SINGULAIR) 10 MG tablet  . Multiple Vitamins-Minerals (MULTIVITAMIN WITH MINERALS) tablet  . mupirocin ointment (BACTROBAN) 2 %  . NON FORMULARY  . nystatin (MYCOSTATIN) 100000 UNIT/ML suspension  . omeprazole (PRILOSEC) 20 MG capsule  . oxybutynin (DITROPAN XL) 10 MG 24 hr tablet  . OXYGEN  . TRELEGY ELLIPTA 100-62.5-25 MCG/INH AEPB  . varenicline (CHANTIX)  0.5 MG tablet  . Vitamin D, Ergocalciferol, (DRISDOL) 50000 units CAPS capsule   No current facility-administered medications for this encounter.    . technetium tetrofosmin (TC-MYOVIEW) injection 56.4 millicurie     Jeneen Rinks  [facility] Short Stay Center/Anesthesiology Phone [phone] 01/16/2018 11:50 AM

## 2018-01-16 NOTE — Anesthesia Preprocedure Evaluation (Addendum)
Anesthesia Evaluation  Patient identified by MRN, date of birth, ID band Patient awake    Airway Mallampati: III  TM Distance: >3 FB Neck ROM: Full    Dental no notable dental hx. (+) Edentulous Upper, Poor Dentition, Dental Advisory Given   Pulmonary shortness of breath, asthma , sleep apnea , COPD, former smoker,    Pulmonary exam normal breath sounds clear to auscultation       Cardiovascular hypertension, +CHF  Normal cardiovascular exam Rhythm:Regular Rate:Normal     Neuro/Psych  Headaches, Anxiety    GI/Hepatic GERD  ,  Endo/Other  diabetesMorbid obesity  Renal/GU Renal disease     Musculoskeletal   Abdominal (+) + obese,   Peds  Hematology   Anesthesia Other Findings   Reproductive/Obstetrics                           Anesthesia Physical Anesthesia Plan  ASA: IV  Anesthesia Plan: Bier Block and Bier Block-LIDOCAINE ONLY   Post-op Pain Management:    Induction: Intravenous  PONV Risk Score and Plan: Treatment may vary due to age or medical condition  Airway Management Planned: Natural Airway and Nasal Cannula  Additional Equipment:   Intra-op Plan:   Post-operative Plan:   Informed Consent:   Dental advisory given  Plan Discussed with: CRNA  Anesthesia Plan Comments: (See PAT note 01/15/2018 by Karoline Caldwell, PA-C )      Anesthesia Quick Evaluation

## 2018-01-19 ENCOUNTER — Ambulatory Visit (HOSPITAL_COMMUNITY): Payer: Medicare Other | Admitting: Anesthesiology

## 2018-01-19 ENCOUNTER — Encounter (HOSPITAL_COMMUNITY)
Admission: RE | Disposition: A | Discharge status: Home / Self Care | Payer: Self-pay | Source: Home / Self Care | Attending: Orthopaedic Surgery

## 2018-01-19 ENCOUNTER — Other Ambulatory Visit: Payer: Self-pay

## 2018-01-19 ENCOUNTER — Ambulatory Visit (INDEPENDENT_AMBULATORY_CARE_PROVIDER_SITE_OTHER): Payer: Medicare Other | Admitting: Orthotics

## 2018-01-19 ENCOUNTER — Ambulatory Visit (HOSPITAL_COMMUNITY)
Admission: RE | Admit: 2018-01-19 | Discharge: 2018-01-19 | Disposition: A | Discharge status: Home / Self Care | Payer: Medicare Other | Attending: Orthopaedic Surgery | Admitting: Orthopaedic Surgery

## 2018-01-19 ENCOUNTER — Ambulatory Visit (HOSPITAL_COMMUNITY): Payer: Medicare Other | Admitting: Physician Assistant

## 2018-01-19 ENCOUNTER — Encounter (HOSPITAL_COMMUNITY): Payer: Self-pay | Admitting: *Deleted

## 2018-01-19 DIAGNOSIS — E1122 Type 2 diabetes mellitus with diabetic chronic kidney disease: Secondary | ICD-10-CM | POA: Diagnosis not present

## 2018-01-19 DIAGNOSIS — J449 Chronic obstructive pulmonary disease, unspecified: Secondary | ICD-10-CM | POA: Diagnosis not present

## 2018-01-19 DIAGNOSIS — M79675 Pain in left toe(s): Secondary | ICD-10-CM

## 2018-01-19 DIAGNOSIS — Z79899 Other long term (current) drug therapy: Secondary | ICD-10-CM | POA: Diagnosis not present

## 2018-01-19 DIAGNOSIS — F319 Bipolar disorder, unspecified: Secondary | ICD-10-CM | POA: Insufficient documentation

## 2018-01-19 DIAGNOSIS — Z87891 Personal history of nicotine dependence: Secondary | ICD-10-CM | POA: Insufficient documentation

## 2018-01-19 DIAGNOSIS — E1140 Type 2 diabetes mellitus with diabetic neuropathy, unspecified: Secondary | ICD-10-CM | POA: Diagnosis not present

## 2018-01-19 DIAGNOSIS — Z794 Long term (current) use of insulin: Secondary | ICD-10-CM | POA: Diagnosis not present

## 2018-01-19 DIAGNOSIS — M79672 Pain in left foot: Secondary | ICD-10-CM

## 2018-01-19 DIAGNOSIS — E1151 Type 2 diabetes mellitus with diabetic peripheral angiopathy without gangrene: Secondary | ICD-10-CM

## 2018-01-19 DIAGNOSIS — Z6841 Body Mass Index (BMI) 40.0 and over, adult: Secondary | ICD-10-CM | POA: Diagnosis not present

## 2018-01-19 DIAGNOSIS — E785 Hyperlipidemia, unspecified: Secondary | ICD-10-CM | POA: Diagnosis not present

## 2018-01-19 DIAGNOSIS — M79674 Pain in right toe(s): Secondary | ICD-10-CM

## 2018-01-19 DIAGNOSIS — N184 Chronic kidney disease, stage 4 (severe): Secondary | ICD-10-CM | POA: Diagnosis not present

## 2018-01-19 DIAGNOSIS — F419 Anxiety disorder, unspecified: Secondary | ICD-10-CM | POA: Diagnosis not present

## 2018-01-19 DIAGNOSIS — M65341 Trigger finger, right ring finger: Secondary | ICD-10-CM | POA: Diagnosis not present

## 2018-01-19 DIAGNOSIS — G4730 Sleep apnea, unspecified: Secondary | ICD-10-CM | POA: Insufficient documentation

## 2018-01-19 DIAGNOSIS — I130 Hypertensive heart and chronic kidney disease with heart failure and stage 1 through stage 4 chronic kidney disease, or unspecified chronic kidney disease: Secondary | ICD-10-CM | POA: Diagnosis not present

## 2018-01-19 DIAGNOSIS — M2011 Hallux valgus (acquired), right foot: Secondary | ICD-10-CM | POA: Diagnosis not present

## 2018-01-19 DIAGNOSIS — Z7951 Long term (current) use of inhaled steroids: Secondary | ICD-10-CM | POA: Diagnosis not present

## 2018-01-19 DIAGNOSIS — I129 Hypertensive chronic kidney disease with stage 1 through stage 4 chronic kidney disease, or unspecified chronic kidney disease: Secondary | ICD-10-CM | POA: Insufficient documentation

## 2018-01-19 DIAGNOSIS — E1142 Type 2 diabetes mellitus with diabetic polyneuropathy: Secondary | ICD-10-CM

## 2018-01-19 DIAGNOSIS — B351 Tinea unguium: Secondary | ICD-10-CM

## 2018-01-19 DIAGNOSIS — K219 Gastro-esophageal reflux disease without esophagitis: Secondary | ICD-10-CM | POA: Insufficient documentation

## 2018-01-19 DIAGNOSIS — N183 Chronic kidney disease, stage 3 (moderate): Secondary | ICD-10-CM | POA: Insufficient documentation

## 2018-01-19 DIAGNOSIS — M65322 Trigger finger, left index finger: Secondary | ICD-10-CM

## 2018-01-19 DIAGNOSIS — M2010 Hallux valgus (acquired), unspecified foot: Secondary | ICD-10-CM

## 2018-01-19 DIAGNOSIS — I509 Heart failure, unspecified: Secondary | ICD-10-CM | POA: Diagnosis not present

## 2018-01-19 HISTORY — PX: TRIGGER FINGER RELEASE: SHX641

## 2018-01-19 SURGERY — RELEASE, A1 PULLEY, FOR TRIGGER FINGER
Anesthesia: Regional | Site: Finger | Laterality: Right

## 2018-01-19 MED ORDER — HYDROCODONE-ACETAMINOPHEN 7.5-325 MG PO TABS
ORAL_TABLET | ORAL | Status: AC
  Filled 2018-01-19: qty 1

## 2018-01-19 MED ORDER — HYDROCODONE-ACETAMINOPHEN 7.5-325 MG PO TABS: 1.0000 | ORAL_TABLET | Freq: Four times a day (QID) | ORAL | 0 refills | Status: DC | PRN

## 2018-01-19 MED ORDER — LIDOCAINE 2% (20 MG/ML) 5 ML SYRINGE
INTRAMUSCULAR | Status: AC
  Filled 2018-01-19: qty 5

## 2018-01-19 MED ORDER — HYDROCODONE-ACETAMINOPHEN 7.5-325 MG PO TABS
1.0000 | ORAL_TABLET | Freq: Once | ORAL | Status: AC
  Administered 2018-01-19: 1 via ORAL

## 2018-01-19 MED ORDER — PHENYLEPHRINE 40 MCG/ML (10ML) SYRINGE FOR IV PUSH (FOR BLOOD PRESSURE SUPPORT)
PREFILLED_SYRINGE | INTRAVENOUS | Status: AC
  Filled 2018-01-19: qty 10

## 2018-01-19 MED ORDER — FENTANYL CITRATE (PF) 250 MCG/5ML IJ SOLN
INTRAMUSCULAR | Status: AC
  Filled 2018-01-19: qty 5

## 2018-01-19 MED ORDER — CHLORHEXIDINE GLUCONATE 4 % EX LIQD: 60.0000 mL | Freq: Once | CUTANEOUS | Status: DC

## 2018-01-19 MED ORDER — BUPIVACAINE-EPINEPHRINE (PF) 0.25% -1:200000 IJ SOLN
INTRAMUSCULAR | Status: DC | PRN
  Administered 2018-01-19: 6 mL

## 2018-01-19 MED ORDER — PROMETHAZINE HCL 25 MG PO TABS: 25.0000 mg | ORAL_TABLET | Freq: Four times a day (QID) | ORAL | 1 refills | Status: DC | PRN

## 2018-01-19 MED ORDER — ONDANSETRON HCL 4 MG/2ML IJ SOLN
INTRAMUSCULAR | Status: DC | PRN
  Administered 2018-01-19: 4 mg via INTRAVENOUS

## 2018-01-19 MED ORDER — PROPOFOL 10 MG/ML IV BOLUS
INTRAVENOUS | Status: DC | PRN
  Administered 2018-01-19: 20 mg via INTRAVENOUS

## 2018-01-19 MED ORDER — MIDAZOLAM HCL 2 MG/2ML IJ SOLN
INTRAMUSCULAR | Status: AC
  Filled 2018-01-19: qty 2

## 2018-01-19 MED ORDER — ONDANSETRON HCL 4 MG/2ML IJ SOLN
INTRAMUSCULAR | Status: AC
  Filled 2018-01-19: qty 2

## 2018-01-19 MED ORDER — PROPOFOL 10 MG/ML IV BOLUS
INTRAVENOUS | Status: AC
  Filled 2018-01-19: qty 20

## 2018-01-19 MED ORDER — MIDAZOLAM HCL 5 MG/5ML IJ SOLN
INTRAMUSCULAR | Status: DC | PRN
  Administered 2018-01-19: 2 mg via INTRAVENOUS

## 2018-01-19 MED ORDER — BUPIVACAINE-EPINEPHRINE 0.25% -1:200000 IJ SOLN
INTRAMUSCULAR | Status: AC
  Filled 2018-01-19: qty 1

## 2018-01-19 MED ORDER — LACTATED RINGERS IV SOLN
INTRAVENOUS | Status: DC
  Administered 2018-01-19: 10:00:00 via INTRAVENOUS

## 2018-01-19 SURGICAL SUPPLY — 38 items
BANDAGE ACE 3X5.8 VEL STRL LF (GAUZE/BANDAGES/DRESSINGS) ×4 IMPLANT
BANDAGE ACE 4X5 VEL STRL LF (GAUZE/BANDAGES/DRESSINGS) ×2 IMPLANT
BNDG GAUZE ELAST 4 BULKY (GAUZE/BANDAGES/DRESSINGS) ×2 IMPLANT
CORDS BIPOLAR (ELECTRODE) ×2 IMPLANT
COVER SURGICAL LIGHT HANDLE (MISCELLANEOUS) ×2 IMPLANT
COVER WAND RF STERILE (DRAPES) ×2 IMPLANT
CUFF TOURNIQUET SINGLE 18IN (TOURNIQUET CUFF) ×1 IMPLANT
CUFF TOURNIQUET SINGLE 24IN (TOURNIQUET CUFF) ×1 IMPLANT
DRAPE SURG 17X23 STRL (DRAPES) ×2 IMPLANT
DURAPREP 26ML APPLICATOR (WOUND CARE) ×2 IMPLANT
GAUZE SPONGE 4X4 12PLY STRL (GAUZE/BANDAGES/DRESSINGS) ×2 IMPLANT
GAUZE SPONGE 4X4 16PLY XRAY LF (GAUZE/BANDAGES/DRESSINGS) ×1 IMPLANT
GAUZE XEROFORM 1X8 LF (GAUZE/BANDAGES/DRESSINGS) ×2 IMPLANT
GLOVE BIO SURGEON STRL SZ8 (GLOVE) ×2 IMPLANT
GLOVE ORTHO TXT STRL SZ7.5 (GLOVE) ×2 IMPLANT
GOWN STRL REUS W/ TWL LRG LVL3 (GOWN DISPOSABLE) ×2 IMPLANT
GOWN STRL REUS W/ TWL XL LVL3 (GOWN DISPOSABLE) ×4 IMPLANT
GOWN STRL REUS W/TWL LRG LVL3 (GOWN DISPOSABLE) ×4
GOWN STRL REUS W/TWL XL LVL3 (GOWN DISPOSABLE) ×8
KIT BASIN OR (CUSTOM PROCEDURE TRAY) ×2 IMPLANT
KIT TURNOVER KIT B (KITS) ×2 IMPLANT
NDL HYPO 25GX1X1/2 BEV (NEEDLE) IMPLANT
NEEDLE HYPO 25GX1X1/2 BEV (NEEDLE) IMPLANT
NS IRRIG 1000ML POUR BTL (IV SOLUTION) ×2 IMPLANT
PACK ORTHO EXTREMITY (CUSTOM PROCEDURE TRAY) ×2 IMPLANT
PAD ARMBOARD 7.5X6 YLW CONV (MISCELLANEOUS) ×4 IMPLANT
PAD CAST 4YDX4 CTTN HI CHSV (CAST SUPPLIES) ×2 IMPLANT
PADDING CAST COTTON 4X4 STRL (CAST SUPPLIES) ×4
RUBBERBAND STERILE (MISCELLANEOUS) ×6 IMPLANT
SUCTION FRAZIER HANDLE 10FR (MISCELLANEOUS)
SUCTION TUBE FRAZIER 10FR DISP (MISCELLANEOUS) IMPLANT
SUT ETHILON 4 0 PS 2 18 (SUTURE) ×2 IMPLANT
SYR CONTROL 10ML LL (SYRINGE) IMPLANT
TOWEL OR 17X24 6PK STRL BLUE (TOWEL DISPOSABLE) ×2 IMPLANT
TOWEL OR 17X26 10 PK STRL BLUE (TOWEL DISPOSABLE) ×2 IMPLANT
TUBE CONNECTING 12X1/4 (SUCTIONS) IMPLANT
UNDERPAD 30X30 (UNDERPADS AND DIAPERS) ×2 IMPLANT
WATER STERILE IRR 1000ML POUR (IV SOLUTION) ×2 IMPLANT

## 2018-01-19 NOTE — Anesthesia Postprocedure Evaluation (Signed)
Anesthesia Post Note  Patient: [name]  Procedure(s) Performed: RIGHT RING FINGER TRIGGER FINGER RELEASE (Right Finger)     Patient location during evaluation: PACU Anesthesia Type: Bier Block and MAC Level of consciousness: awake and alert Pain management: pain level controlled Vital Signs Assessment: post-procedure vital signs reviewed and stable Respiratory status: spontaneous breathing, nonlabored ventilation, respiratory function stable and patient connected to nasal cannula oxygen Cardiovascular status: stable and blood pressure returned to baseline Postop Assessment: no apparent nausea or vomiting Anesthetic complications: no    Last Vitals:  Vitals:   01/19/18 0807 01/19/18 1026  BP: (!) 155/89 (!) 178/83  Pulse: (!) 53 79  Temp: 36.8 C   SpO2: 96% (!) 89%    Last Pain:  Vitals:   01/19/18 0832  TempSrc:   PainSc: 10-Worst pain ever                 Barnet Glasgow

## 2018-01-19 NOTE — Anesthesia Procedure Notes (Signed)
Procedure Name: MAC Date/Time: 01/19/2018 10:00 AM Performed by: Renato Shin, CRNA Pre-anesthesia Checklist: Patient identified, Emergency Drugs available, Suction available and Patient being monitored Patient Re-evaluated:Patient Re-evaluated prior to induction Oxygen Delivery Method: Simple face mask Preoxygenation: Pre-oxygenation with 100% oxygen Induction Type: IV induction Placement Confirmation: positive ETCO2 and breath sounds checked- equal and bilateral Dental Injury: Teeth and Oropharynx as per pre-operative assessment

## 2018-01-19 NOTE — H&P (Signed)
PREOPERATIVE H&P  Chief Complaint: right ring finger trigger finger  HPI: [name] is a 52 y.o. female who presents for surgical treatment of right ring finger trigger finger.  She denies any changes in medical history.  Past Medical History:  Diagnosis Date  . Anxiety   . Arthritis 04-10-11   hips, shoulders, back  . Asthma   . Bipolar disorder ([place])   . Cancer ([place]) 1993   cervical, no treatment done, went away per pt  . Cervical dysplasia or atypia 04-10-11   '93- once dx.-got pregnant-no intervention, then postpartum, no dysplasia found  . CHF (congestive heart failure) ([place])    no cardiologist 2014 dx, none now  . Chronic kidney disease    stage 3 kidney disease  . 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 Pathology showed Condyloma, gluteal CONDYLOMA ACUMINATUM.   [name] Kitchen COPD (chronic obstructive pulmonary disease) ([place])   . Diabetes mellitus without complication ([place])   . Dyspnea    with actity, sitting  . Fall 12/16/2016  . Fibrocystic breast disease   . GERD (gastroesophageal reflux disease)   . Hepatitis    hep B-count is low at present,doesn't register  . Hypercalcemia   . Hyperlipidemia   . Hypertension   . Lung nodule 11/2017   right and left lung  . Migraine headache    none recent  . Morbid obesity ([place]) 03/27/2006  . Neuropathy   . Pneumonia    walking pneu 15 yrs. ago  . Skin lesion 03/15/2011   In gluteal crease now s/p removal by Dr. [name] of General Surgery on 3/12. Path shows condyloma.     . Sleep apnea 04-10-11   uses cpap, pt does not know settings  . Trigger finger    left third   Past Surgical History:  Procedure Laterality Date  . ANAL FISTULECTOMY  04/17/2011   Procedure: FISTULECTOMY ANAL;  Surgeon: Imogene Burn. [name], MD;  Location: [facility];  Service: General;  Laterality: N/A;  Excision of Condyloma Gluteal Cleft   . BACK SURGERY  04-10-11   x5-Lumbar fusion-retained hardware.(Dr. Louanne Skye)  . BREAST CYST  EXCISION     bilateral breast, 3 cysts removed from each breast  . BREAST EXCISIONAL BIOPSY Right    x 3  . BREAST EXCISIONAL BIOPSY Left    x 3  . CARPAL TUNNEL RELEASE Bilateral   . Cervical biospy    . COLONOSCOPY WITH PROPOFOL N/A 07/06/2015   Procedure: COLONOSCOPY WITH PROPOFOL;  Surgeon: [name], MD;  Location: [facility];  Service: Endoscopy;  Laterality: N/A;  . DOPPLER ECHOCARDIOGRAPHY  04/06/2012   AT [place] 55-60%  . I&D EXTREMITY Left 12/18/2016   Procedure: IRRIGATION AND DEBRIDEMENT LEFT FOOT, CLOSURE;  Surgeon: [name], MD;  Location: [place];  Service: Orthopedics;  Laterality: Left;  [name] Kitchen MULTIPLE TOOTH EXTRACTIONS    . NECK SURGERY  04-10-11   x3- cervical fusion with plating and screws-Dr. [name]  . TEE WITHOUT CARDIOVERSION N/A 04/06/2012   Procedure: TRANSESOPHAGEAL ECHOCARDIOGRAM (TEE);  Surgeon: [name], MD;  Location[facility];  Service: Cardiovascular;  Laterality: N/A;  . TRIGGER FINGER RELEASE Right    middle finger  . TRIGGER FINGER RELEASE Left 06/28/2016   Procedure: RELEASE TRIGGER FINGER LEFT 3RD FINGER;  Surgeon: [name], MD;  Location: [place];  Service: Orthopedics;  Laterality: Left;  . TRIGGER FINGER RELEASE Right 06/12/2016   Procedure: RIGHT INDEX FINGER  TRIGGER RELEASE;  Surgeon: [name], MD;  Location: [place];  Service: Orthopedics;  Laterality: Right;   Social History   Socioeconomic History  . Marital status: Divorced    Spouse name: Not on file  . Number of children: Not on file  . Years of education: Not on file  . Highest education level: Not on file  Occupational History  . Not on file  Social Needs  . Financial resource strain: Not on file  . Food insecurity:    Worry: Never true    Inability: Often true  . Transportation needs:    Medical: Not on file    Non-medical: Not on file  Tobacco Use  . Smoking status: Former Smoker    Packs/day: 1.00    Years: 40.00    Pack years: 40.00    Types:  Cigarettes    Start date: 01/28/1977    Last attempt to quit: 09/07/2017    Years since quitting: 0.3  . Smokeless tobacco: Never Used  . Tobacco comment: Quit prior to hospitalization using low dose Chantix  Substance and Sexual Activity  . Alcohol use: No    Alcohol/week: 0.0 standard drinks  . Drug use: Not Currently    Types: Marijuana, "Crack" cocaine    Comment: hx marijuana usemany years ago, Crack -none since 11/2015  . Sexual activity: Yes    Partners: Male  Lifestyle  . Physical activity:    Days per week: Not on file    Minutes per session: Not on file  . Stress: Not on file  Relationships  . Social connections:    Talks on phone: Not on file    Gets together: Not on file    Attends religious service: Not on file    Active member of club or organization: Not on file    Attends meetings of clubs or organizations: Not on file    Relationship status: Not on file  Other Topics Concern  . Not on file  Social History Narrative   Current Social History 01/23/2017        Who lives at home: Patient lives alone in one level home 01/23/2017    Transportation: Patient has own vehicle and drives herself 40/98/1191   Important Relationships "Family and friends" 01/23/2017    Pets: None 01/23/2017   Education / Work:  12 th grade/ None 01/23/2017   Interests / Fun: "Play bingo on phone, watch TV, Be with family and friends, sit on my porch." 01/23/2017   Current Stressors: None 01/23/2017   Religious / Personal Beliefs: "God Jesus" 01/23/2017   Other: "I love everyone, I wake up with joy in my heart and go to sleep the same way. I'm a lovable person." 01/23/2017   L. Ducatte, RN, BSN                                                                                                 Family History  Problem Relation Age of Onset  . Stroke Father   . Cancer Maternal Aunt        breast  .  Breast cancer Maternal Aunt   . Asthma Other   . COPD Other   . Hypertension Other   .  Diabetes Other    Allergies  Allergen Reactions  . Chantix [Varenicline Tartrate] Other (See Comments)    On the 1 mg dose, patient experienced hallucintation  . Corticosteroids Other (See Comments)    HYPERGLYCEMIA  . 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 Levofloxacin Itching   Prior to Admission medications   Medication Sig Start Date End Date Taking? Authorizing Provider  albuterol (PROAIR HFA) 108 (90 Base) MCG/ACT inhaler Inhale 2 puffs into the lungs every 6 (six) hours as needed for shortness of breath. 01/09/18  Yes Hensel, Jamal Collin, MD  allopurinol (ZYLOPRIM) 100 MG tablet Take 100 mg by mouth daily.   Yes Rosita Fire, MD  ammonium lactate (AMLACTIN) 12 % lotion Apply 1 application topically 2 (two) times daily as needed for dry skin. 12/12/17  Yes [name], MD  atorvastatin (LIPITOR) 40 MG tablet Take 1 tablet (40 mg total) by mouth daily. 12/31/17  Yes [name], MD  baclofen (LIORESAL) 10 MG tablet Take 2 tablets (20 mg total) by mouth 3 (three) times daily. 01/09/18  Yes Hensel, Jamal Collin, MD  busPIRone (BUSPAR) 10 MG tablet Take 1 tablet (10 mg total) by mouth 3 (three) times daily. 05/29/17  Yes [name], MD  citalopram (CELEXA) 40 MG tablet TAKE 1 TABLET BY MOUTH EVERY DAY Patient taking differently: Take 40 mg by mouth daily.  12/24/17  Yes Sela Hilding, MD  diclofenac sodium (VOLTAREN) 1 % GEL APPLY 2 GRAMS TO AFFECTED AREA 4 TIMES A DAY Patient taking differently: Apply 2 g topically 4 (four) times daily.  09/30/17  Yes Jessy Oto, MD  diltiazem (CARDIZEM CD) 120 MG 24 hr capsule Take 1 capsule (120 mg total) by mouth 2 (two) times daily. 01/08/18 01/03/19 Yes [name], MD  docusate sodium (COLACE) 100 MG capsule Take 200 mg by mouth 2 (two) times daily.   Yes [provider]  fluticasone (FLONASE) 50 MCG/ACT nasal spray USE 2 SPRAYS EACH NOSTRIL TWICE DAILY. Patient taking differently: Place 2  sprays into both nostrils 2 (two) times daily.  11/07/17  Yes [name], MD  furosemide (LASIX) 40 MG tablet TAKE 1 TABLET BY MOUTH TWICE A DAY Patient taking differently: Take 40 mg by mouth 2 (two) times daily.  12/29/17  Yes [name], MD  gabapentin (NEURONTIN) 300 MG capsule TAKE 2 CAPSULES (600 MG TOTAL) BY MOUTH 3 (THREE) TIMES DAILY. 11/20/17  Yes Jessy Oto, MD  hydrOXYzine (VISTARIL) 100 MG capsule Take 1 capsule (100 mg total) by mouth 3 (three) times daily as needed for itching. 05/29/17  Yes [name], MD  Insulin Glargine (LANTUS SOLOSTAR) 100 UNIT/ML Solostar Pen Inject 10 Units into the skin daily. 10/16/17  Yes Hensel, Jamal Collin, MD  insulin lispro (HUMALOG KWIKPEN) 100 UNIT/ML KiwkPen Inject 0.03 mLs (3 Units total) into the skin 3 (three) times daily before meals. 05/29/17  Yes [name], MD  ketoconazole (NIZORAL) 2 % cream Apply 1 fingertip amount to each foot daily. Patient taking differently: Apply 1 application topically 2 (two) times daily. Apply 1 fingertip amount to each foot TWICE DAILY 06/25/17  Yes Gardiner Barefoot, DPM  loratadine (CLARITIN) 10 MG tablet Take 1 tablet (10 mg total) by mouth daily. 05/29/17  Yes [name], MD  losartan (COZAAR) 100 MG tablet Take 1  tablet (100 mg total) by mouth daily. 05/29/17  Yes [name], MD  montelukast (SINGULAIR) 10 MG tablet Take 1 tablet (10 mg total) by mouth at bedtime. AS DIRECTED 05/29/17  Yes [name], MD  Multiple Vitamins-Minerals (MULTIVITAMIN WITH MINERALS) tablet Take 1 tablet by mouth daily.   Yes [provider]  mupirocin ointment (BACTROBAN) 2 % Apply 1 application topically 2 (two) times daily. 10/29/17  Yes [name], MD  nystatin (MYCOSTATIN) 100000 UNIT/ML suspension Take 5 mLs (500,000 Units total) by mouth 4 (four) times daily. Swish and swallow. Patient taking differently: Take 5 mLs by mouth daily as needed (mouth sores). Swish and swallow. 11/28/17  Yes [name], MD  omeprazole  (PRILOSEC) 20 MG capsule Take 1 capsule (20 mg total) by mouth daily as needed. Patient taking differently: Take 20 mg by mouth daily.  05/29/17  Yes [name], MD  oxybutynin (DITROPAN XL) 10 MG 24 hr tablet Take 1 tablet (10 mg total) by mouth at bedtime. 10/07/17  Yes Martyn Malay, MD  OXYGEN Inhale 2 L into the lungs continuous.   Yes [provider]  TRELEGY ELLIPTA 100-62.5-25 MCG/INH AEPB INHALE 1 PUFF INTO THE LUNGS DAILY Patient taking differently: Inhale 1 puff into the lungs daily.  01/05/18  Yes [name], MD  Vitamin D, Ergocalciferol, (DRISDOL) 50000 units CAPS capsule Take 50,000 Units by mouth every 30 (thirty) days.    Yes [provider]  ACCU-CHEK AVIVA PLUS test strip TEST 4 TIMES DAILY 10/13/17   [name], MD  ACCU-CHEK SOFTCLIX LANCETS lancets TEST 4 TIMES DAILY 01/12/18   [name], MD  blood glucose meter kit and supplies KIT Dispense based on patient and insurance preference. Use up to four times daily as directed. (FOR ICD-9 250.00, 250.01). 12/20/16   Bonnita Hollow, MD  Capsaicin-Menthol (PAIN RELIEF EX) Apply 1 application topically 3 (three) times daily as needed (shoulder pain).    [provider]  famotidine (PEPCID) 10 MG tablet Take 1 tablet (10 mg total) by mouth daily. Patient not taking: Reported on 01/09/2018 10/05/17   Lorella Nimrod, MD  HYDROcodone-acetaminophen (NORCO) 7.5-325 MG tablet Take 1-2 tablets by mouth every 6 (six) hours as needed for moderate pain or severe pain. Patient not taking: Reported on 01/09/2018 12/16/17   Jessy Oto, MD  Insulin Pen Needle (B-D UF III MINI PEN NEEDLES) 31G X 5 MM MISC CHECK SUGARS 4 TIMES A DAY BEFORE MEALS AND AT BEDTIME. 08/25/17   [name], MD  JARDIANCE 25 MG TABS tablet TAKE 1 TABLET BY MOUTH EVERY DAY 01/12/18   [name], MD  NON FORMULARY Uses a C-PAP at bedtime    [provider]  varenicline (CHANTIX) 0.5 MG tablet Take 1 tablet (0.5 mg total) by mouth  2 (two) times daily. One daily x 3 days BID Patient not taking: Reported on 01/09/2018 09/01/17   Zenia Resides, MD  sucralfate (CARAFATE) 1 G tablet Take 1 tablet (1 g total) by mouth daily. 04/27/10 04/09/11  Arnetha Gula, MD     Positive ROS: All other systems have been reviewed and were otherwise negative with the exception of those mentioned in the HPI and as above.  Physical Exam: General: Alert, no acute distress Cardiovascular: No pedal edema Respiratory: No cyanosis, no use of accessory musculature GI: abdomen soft Skin: No lesions in the area of chief complaint Neurologic: Sensation intact distally Psychiatric: Patient is competent for consent with normal mood and affect  Lymphatic: no lymphedema  MUSCULOSKELETAL: exam stable  Assessment: right ring finger trigger finger  Plan: Plan for Procedure(s): RIGHT RING FINGER TRIGGER FINGER RELEASE  The risks benefits and alternatives were discussed with the patient including but not limited to the risks of nonoperative treatment, versus surgical intervention including infection, bleeding, nerve injury,  blood clots, cardiopulmonary complications, morbidity, mortality, among others, and they were willing to proceed.   Eduard Roux, MD   01/19/2018 7:25 AM

## 2018-01-19 NOTE — Discharge Instructions (Signed)
Postoperative instructions:  Weightbearing instructions: no heavy lifting for 4 weeks  Dressing instructions: Keep your dressing and/or splint clean and dry at all times.  It will be removed at your first post-operative appointment.  Your stitches and/or staples will be removed at this visit.  Incision instructions:  Do not soak your incision for 3 weeks after surgery.  If the incision gets wet, pat dry and do not scrub the incision.  Pain control:  You have been given a prescription to be taken as directed for post-operative pain control.  In addition, elevate the operative extremity above the heart at all times to prevent swelling and throbbing pain.  Take over-the-counter Colace, 100mg  by mouth twice a day while taking narcotic pain medications to help prevent constipation.  Follow up appointments: 1) 12-14 days for suture removal and wound check. 2) Dr. Erlinda Hong as scheduled.   -------------------------------------------------------------------------------------------------------------  After Surgery Pain Control:  After your surgery, post-surgical discomfort or pain is likely. This discomfort can last several days to a few weeks. At certain times of the day your discomfort may be more intense.  Did you receive a nerve block?  A nerve block can provide pain relief for one hour to two days after your surgery. As long as the nerve block is working, you will experience little or no sensation in the area the surgeon operated on.  As the nerve block wears off, you will begin to experience pain or discomfort. It is very important that you begin taking your prescribed pain medication before the nerve block fully wears off. Treating your pain at the first sign of the block wearing off will ensure your pain is better controlled and more tolerable when full-sensation returns. Do not wait until the pain is intolerable, as the medicine will be less effective. It is better to treat pain in advance than to  try and catch up.  General Anesthesia:  If you did not receive a nerve block during your surgery, you will need to start taking your pain medication shortly after your surgery and should continue to do so as prescribed by your surgeon.  Pain Medication:  Most commonly we prescribe Vicodin and Percocet for post-operative pain. Both of these medications contain a combination of acetaminophen (Tylenol) and a narcotic to help control pain.   It takes between 30 and 45 minutes before pain medication starts to work. It is important to take your medication before your pain level gets too intense.   Nausea is a common side effect of many pain medications. You will want to eat something before taking your pain medicine to help prevent nausea.   If you are taking a prescription pain medication that contains acetaminophen, we recommend that you do not take additional over the counter acetaminophen (Tylenol).  Other pain relieving options:   Using a cold pack to ice the affected area a few times a day (15 to 20 minutes at a time) can help to relieve pain, reduce swelling and bruising.   Elevation of the affected area can also help to reduce pain and swelling.

## 2018-01-19 NOTE — Transfer of Care (Signed)
Immediate Anesthesia Transfer of Care Note  Patient: [name]  Procedure(s) Performed: RIGHT RING FINGER TRIGGER FINGER RELEASE (Right Finger)  Patient Location: PACU  Anesthesia Type:Bier block  Level of Consciousness: awake, alert , oriented and patient cooperative  Airway & Oxygen Therapy: Patient Spontanous Breathing and Patient connected to nasal cannula oxygen  Post-op Assessment: Report given to RN, Post -op Vital signs reviewed and stable and Patient moving all extremities X 4  Post vital signs: Reviewed and stable  Last Vitals:  Vitals Value Taken Time  BP    Temp    Pulse 82 01/19/2018 10:25 AM  Resp    SpO2 90 % 01/19/2018 10:25 AM  Vitals shown include unvalidated device data.  Last Pain:  Vitals:   01/19/18 0832  TempSrc:   PainSc: 10-Worst pain ever         Complications: No apparent anesthesia complications

## 2018-01-19 NOTE — Op Note (Signed)
   Date of Surgery: 01/19/2018  INDICATIONS: [name] is a 52 y.o.-year-old female who presents for surgical treatment of a right ring trigger finger;  The patient did consent to the procedure after discussion of the risks and benefits.  PREOPERATIVE DIAGNOSIS: right ring trigger finger  POSTOPERATIVE DIAGNOSIS: Same.  PROCEDURE: Incision of A1 pulley for trigger finger release, right ring finger  SURGEON: N. Eduard Roux, M.D.  ASSIST: Ciro Backer [place], [place]; necessary for the timely completion of procedure and due to complexity of procedure..  ANESTHESIA:  Bier block  IV FLUIDS AND URINE: See anesthesia.  ESTIMATED BLOOD LOSS: minimal mL  COMPLICATIONS: None.  DESCRIPTION OF PROCEDURE: The patient was identified in the preoperative holding area. The operative site was marked by the surgeon confirmed with the patient. He is brought back to the operating room. She was placed supine on table. A nonsterile tourniquet was placed on the upper forearm. The extremity was exsanguinated using Esmarch bandage and the tourniquet was inflated to 250 mmHg. The Bier block was administered. The operative extremity was prepped and draped in standard sterile fashion. Timeout was performed. Antibiotics were given. Timeout was performed. A longitudinal incision based in the palmar crease in line with the ring finger was used. Blunt dissection was taken down to the level of the flexor tendon. The neurovascular bundles were identified on each side of the tendon sheath and protected. The possible edge of the A1 pulley was identified. This was sharply incised. Of note the tendon was of good quality and did not exhibit any tears. The A1 pulley was incised along its full width. Care was taken not to violate the A2 pulley. The palmar pulley was then visualized and released also. The tourniquet was then deflated and hemostasis was obtained. Local anesthesia was infiltrated. The wound was thoroughly irrigated and  closed with 3-0 nylon sutures. Sterile dressings were applied and the hand was placed in a soft dressing. Patient tolerated the procedure well and was taken to the PACU in stable condition.  POSTOPERATIVE PLAN: Patient will be weight bearing as tolerated and to avoid heavy lifting for 4 weeks.    Mary Cecil, MD [place] 10:15 AM

## 2018-01-19 NOTE — Progress Notes (Signed)

## 2018-01-20 ENCOUNTER — Encounter (HOSPITAL_COMMUNITY): Payer: Self-pay | Admitting: Orthopaedic Surgery

## 2018-01-21 ENCOUNTER — Other Ambulatory Visit: Payer: Self-pay | Admitting: Student in an Organized Health Care Education/Training Program

## 2018-01-21 ENCOUNTER — Other Ambulatory Visit: Payer: Self-pay | Admitting: Family Medicine

## 2018-01-21 DIAGNOSIS — E8770 Fluid overload, unspecified: Secondary | ICD-10-CM

## 2018-01-21 DIAGNOSIS — R600 Localized edema: Secondary | ICD-10-CM

## 2018-01-21 DIAGNOSIS — R0789 Other chest pain: Secondary | ICD-10-CM

## 2018-01-21 DIAGNOSIS — F418 Other specified anxiety disorders: Secondary | ICD-10-CM

## 2018-01-21 DIAGNOSIS — J302 Other seasonal allergic rhinitis: Secondary | ICD-10-CM

## 2018-01-22 ENCOUNTER — Other Ambulatory Visit: Payer: Self-pay | Admitting: Student in an Organized Health Care Education/Training Program

## 2018-01-22 ENCOUNTER — Other Ambulatory Visit: Payer: Self-pay | Admitting: Internal Medicine

## 2018-01-22 ENCOUNTER — Other Ambulatory Visit (INDEPENDENT_AMBULATORY_CARE_PROVIDER_SITE_OTHER): Payer: Self-pay | Admitting: Orthopaedic Surgery

## 2018-01-22 ENCOUNTER — Telehealth: Payer: Self-pay

## 2018-01-22 ENCOUNTER — Other Ambulatory Visit: Payer: Self-pay

## 2018-01-22 DIAGNOSIS — J302 Other seasonal allergic rhinitis: Secondary | ICD-10-CM

## 2018-01-22 MED ORDER — FLUTICASONE PROPIONATE 50 MCG/ACT NA SUSP: 2.0000 | Freq: Two times a day (BID) | NASAL | 1 refills | Status: DC

## 2018-01-22 NOTE — Telephone Encounter (Signed)
Called patient as I am covering Dr. Ernestina Penna inbox. Patient has a phone encounter open with cardiology where she related that she has gained excess weight and was told to take 80mg  BID until she has an appt with them on Monday. She confirms that she has enough lasix to last until Monday. I will not refill the 40mg  today so as not to confuse her. Asked her to either ask cardiology for more lasix (in case they change her dose) or call back and let us know that the dose going forward will be and then we can refill. She voiced understanding.

## 2018-01-22 NOTE — Telephone Encounter (Signed)
Spoke with pt who states she has gained 20 lbs in 4 days. She reports on 12/22 she weighed 404 then on 12/23 she had hand surgery and received IV fluids. She report when she got home on 12/24 she weighed 417. Pt admits that over the past 2 days, she has ate more salty food than normal. She reports this morning she weighs  424. Pt states she is only SOB when she walk short distances but feels fine when she is sitting.    Pt advised that Per DOD Dr. Oval Linsey, she is to double her lasix to 80 mg BID for the next 3 days and return for an OV. Appointment scheduled for 12/31 at 130 pm with Almyra Deforest, PA. Pt instructed to continue to weigh herself daily and if she becomes more symptomatic over the weekend, to report to ED. Pt verbalized understanding.

## 2018-01-22 NOTE — Telephone Encounter (Signed)
I am covering Dr. Ernestina Penna inbox, will refill celexa.

## 2018-01-23 ENCOUNTER — Encounter (INDEPENDENT_AMBULATORY_CARE_PROVIDER_SITE_OTHER): Payer: Self-pay | Admitting: Orthopaedic Surgery

## 2018-01-23 MED ORDER — HYDROXYZINE PAMOATE 100 MG PO CAPS: 100.0000 mg | ORAL_CAPSULE | Freq: Three times a day (TID) | ORAL | 0 refills | Status: DC | PRN

## 2018-01-23 MED ORDER — OMEPRAZOLE 20 MG PO CPDR: 20.0000 mg | DELAYED_RELEASE_CAPSULE | Freq: Every day | ORAL | 0 refills | Status: DC | PRN

## 2018-01-23 MED ORDER — HYDROCODONE-ACETAMINOPHEN 7.5-325 MG PO TABS: 1.0000 | ORAL_TABLET | Freq: Every day | ORAL | 0 refills | Status: DC | PRN

## 2018-01-25 ENCOUNTER — Encounter: Payer: Self-pay | Admitting: Student in an Organized Health Care Education/Training Program

## 2018-01-25 ENCOUNTER — Emergency Department (HOSPITAL_COMMUNITY): Payer: Medicare Other

## 2018-01-25 ENCOUNTER — Encounter (HOSPITAL_COMMUNITY): Payer: Self-pay

## 2018-01-25 ENCOUNTER — Emergency Department (HOSPITAL_COMMUNITY)
Admission: EM | Admit: 2018-01-25 | Discharge: 2018-01-25 | Disposition: A | Discharge status: Home / Self Care | Payer: Medicare Other | Attending: Emergency Medicine | Admitting: Emergency Medicine

## 2018-01-25 DIAGNOSIS — E1122 Type 2 diabetes mellitus with diabetic chronic kidney disease: Secondary | ICD-10-CM | POA: Insufficient documentation

## 2018-01-25 DIAGNOSIS — R0602 Shortness of breath: Secondary | ICD-10-CM | POA: Diagnosis not present

## 2018-01-25 DIAGNOSIS — N183 Chronic kidney disease, stage 3 (moderate): Secondary | ICD-10-CM | POA: Diagnosis not present

## 2018-01-25 DIAGNOSIS — R609 Edema, unspecified: Secondary | ICD-10-CM | POA: Diagnosis not present

## 2018-01-25 DIAGNOSIS — Z87891 Personal history of nicotine dependence: Secondary | ICD-10-CM | POA: Insufficient documentation

## 2018-01-25 DIAGNOSIS — Y998 Other external cause status: Secondary | ICD-10-CM | POA: Insufficient documentation

## 2018-01-25 DIAGNOSIS — I130 Hypertensive heart and chronic kidney disease with heart failure and stage 1 through stage 4 chronic kidney disease, or unspecified chronic kidney disease: Secondary | ICD-10-CM | POA: Insufficient documentation

## 2018-01-25 DIAGNOSIS — I10 Essential (primary) hypertension: Secondary | ICD-10-CM | POA: Diagnosis not present

## 2018-01-25 DIAGNOSIS — I509 Heart failure, unspecified: Secondary | ICD-10-CM | POA: Diagnosis not present

## 2018-01-25 DIAGNOSIS — E785 Hyperlipidemia, unspecified: Secondary | ICD-10-CM | POA: Insufficient documentation

## 2018-01-25 DIAGNOSIS — J449 Chronic obstructive pulmonary disease, unspecified: Secondary | ICD-10-CM | POA: Diagnosis not present

## 2018-01-25 DIAGNOSIS — Y92003 Bedroom of unspecified non-institutional (private) residence as the place of occurrence of the external cause: Secondary | ICD-10-CM | POA: Insufficient documentation

## 2018-01-25 DIAGNOSIS — S0990XA Unspecified injury of head, initial encounter: Secondary | ICD-10-CM | POA: Diagnosis not present

## 2018-01-25 DIAGNOSIS — Y939 Activity, unspecified: Secondary | ICD-10-CM | POA: Insufficient documentation

## 2018-01-25 DIAGNOSIS — W19XXXA Unspecified fall, initial encounter: Secondary | ICD-10-CM | POA: Diagnosis not present

## 2018-01-25 DIAGNOSIS — S6992XA Unspecified injury of left wrist, hand and finger(s), initial encounter: Secondary | ICD-10-CM | POA: Diagnosis not present

## 2018-01-25 DIAGNOSIS — M25532 Pain in left wrist: Secondary | ICD-10-CM | POA: Diagnosis not present

## 2018-01-25 DIAGNOSIS — E1165 Type 2 diabetes mellitus with hyperglycemia: Secondary | ICD-10-CM | POA: Diagnosis not present

## 2018-01-25 DIAGNOSIS — W06XXXA Fall from bed, initial encounter: Secondary | ICD-10-CM | POA: Diagnosis not present

## 2018-01-25 DIAGNOSIS — R918 Other nonspecific abnormal finding of lung field: Secondary | ICD-10-CM | POA: Diagnosis not present

## 2018-01-25 LAB — BASIC METABOLIC PANEL
Anion gap: 11 (ref 5–15)
BUN: 17 mg/dL (ref 6–20)
CO2: 30 mmol/L (ref 22–32)
Calcium: 9.3 mg/dL (ref 8.9–10.3)
Chloride: 98 mmol/L (ref 98–111)
Creatinine, Ser: 1.48 mg/dL — ABNORMAL HIGH (ref 0.44–1.00)
GFR calc Af Amer: 46 mL/min — ABNORMAL LOW (ref >60)
GFR calc non Af Amer: 40 mL/min — ABNORMAL LOW (ref >60)
GLUCOSE: 247 mg/dL — AB (ref 70–99)
Potassium: 4.5 mmol/L (ref 3.5–5.1)
Sodium: 139 mmol/L (ref 135–145)

## 2018-01-25 LAB — URINALYSIS, ROUTINE W REFLEX MICROSCOPIC
Bilirubin Urine: NEGATIVE
Glucose, UA: NEGATIVE mg/dL
Hgb urine dipstick: NEGATIVE
Ketones, ur: NEGATIVE mg/dL
Leukocytes, UA: NEGATIVE
NITRITE: NEGATIVE
PH: 7 (ref 5.0–8.0)
Protein, ur: NEGATIVE mg/dL
Specific Gravity, Urine: 1.006 (ref 1.005–1.030)

## 2018-01-25 LAB — CBC
HCT: 45.5 % (ref 36.0–46.0)
Hemoglobin: 15 g/dL (ref 12.0–15.0)
MCH: 26.2 pg (ref 26.0–34.0)
MCHC: 33 g/dL (ref 30.0–36.0)
MCV: 79.4 fL — ABNORMAL LOW (ref 80.0–100.0)
Platelets: 361 10*3/uL (ref 150–400)
RBC: 5.73 MIL/uL — ABNORMAL HIGH (ref 3.87–5.11)
RDW: 16.4 % — ABNORMAL HIGH (ref 11.5–15.5)
WBC: 9.8 10*3/uL (ref 4.0–10.5)
nRBC: 0 % (ref 0.0–0.2)

## 2018-01-25 LAB — I-STAT VENOUS BLOOD GAS, ED
Acid-Base Excess: 6 mmol/L — ABNORMAL HIGH (ref 0.0–2.0)
BICARBONATE: 30.5 mmol/L — AB (ref 20.0–28.0)
O2 Saturation: 98 %
TCO2: 32 mmol/L (ref 22–32)
pCO2, Ven: 43.5 mmHg — ABNORMAL LOW (ref 44.0–60.0)
pH, Ven: 7.453 — ABNORMAL HIGH (ref 7.250–7.430)
pO2, Ven: 97 mmHg — ABNORMAL HIGH (ref 32.0–45.0)

## 2018-01-25 LAB — BRAIN NATRIURETIC PEPTIDE: B Natriuretic Peptide: 46.9 pg/mL (ref 0.0–100.0)

## 2018-01-25 LAB — I-STAT TROPONIN, ED: Troponin i, poc: 0 ng/mL (ref 0.00–0.08)

## 2018-01-25 MED ORDER — ACETAMINOPHEN 500 MG PO TABS
1000.0000 mg | ORAL_TABLET | Freq: Once | ORAL | Status: DC
  Filled 2018-01-25: qty 2

## 2018-01-25 MED ORDER — FUROSEMIDE 10 MG/ML IJ SOLN
60.0000 mg | Freq: Once | INTRAMUSCULAR | Status: AC
  Administered 2018-01-25: 60 mg via INTRAVENOUS
  Filled 2018-01-25: qty 6

## 2018-01-25 NOTE — Discharge Instructions (Addendum)
You were evaluated in the Emergency Department and after careful evaluation, we did not find any emergent condition requiring admission or further testing in the hospital.  Your symptoms today seem to be due to bruising from a fall.  Your x-rays did not reveal any broken bones.  You do have some extra fluid due to your heart failure, but you made a good amount of urine after a dose of Lasix here in the emergency department.  Please follow-up closely with your cardiologist to discuss your Lasix dosing.  Please return to the Emergency Department if you experience any worsening of your condition.  We encourage you to follow up with a primary care provider.  Thank you for allowing [place] to be a part of your care.

## 2018-01-25 NOTE — ED Provider Notes (Signed)
[facility] Emergency Department Provider Note MRN:  [id number]  Arrival date & time: 01/25/18     Chief Complaint   Fall   History of Present Illness   [name] is a 52 y.o. year-old female with a history of CHF, COPD presenting to the ED with chief complaint of fall.  Patient explains that she has been gaining water weight for the past week, recently increased her Lasix dose from 40 to 80 mg.  Was having issues with sleeping overnight, waking up often.  Found herself falling asleep and abruptly waking up multiple times.  This is abnormal for her.  On her way back from the bathroom, she slid off the bed falling forward onto all fours.  Endorsing mild head trauma to her forehead, no loss consciousness.  No blood thinners.  Denies any significant trauma or pain other than new left wrist pain, which is constant, worse with motion.  Endorsing soreness to the forehead but no headache, no vomiting, no neck pain, no chest pain or shortness of breath, no abdominal pain.  Review of Systems  A complete 10 system review of systems was obtained and all systems are negative except as noted in the HPI and PMH.   Patient's Health History    Past Medical History:  Diagnosis Date  . Anxiety   . Arthritis 04-10-11   hips, shoulders, back  . Asthma   . Bipolar disorder ([place])   . Cancer ([place]) 1993   cervical, no treatment done, went away per pt  . Cervical dysplasia or atypia 04-10-11   '93- once dx.-got pregnant-no intervention, then postpartum, no dysplasia found  . CHF (congestive heart failure) ([place])    no cardiologist 2014 dx, none now  . Chronic kidney disease    stage 3 kidney disease  . 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 Pathology showed Condyloma, gluteal CONDYLOMA ACUMINATUM.   [name] Kitchen COPD (chronic obstructive pulmonary disease) ([place])   . Diabetes mellitus without complication ([place])   . Dyspnea    with actity, sitting  . Fall 12/16/2016   . Fibrocystic breast disease   . GERD (gastroesophageal reflux disease)   . Hepatitis    hep B-count is low at present,doesn't register  . Hypercalcemia   . Hyperlipidemia   . Hypertension   . Lung nodule 11/2017   right and left lung  . Migraine headache    none recent  . Morbid obesity ([place]) 03/27/2006  . Neuropathy   . Pneumonia    walking pneu 15 yrs. ago  . Skin lesion 03/15/2011   In gluteal crease now s/p removal by Dr. [name] of General Surgery on 3/12. Path shows condyloma.     . Sleep apnea 04-10-11   uses cpap, pt does not know settings  . Trigger finger    left third    Past Surgical History:  Procedure Laterality Date  . ANAL FISTULECTOMY  04/17/2011   Procedure: FISTULECTOMY ANAL;  Surgeon: Imogene Burn. [name], MD;  Location: [facility];  Service: General;  Laterality: N/A;  Excision of Condyloma Gluteal Cleft   . BACK SURGERY  04-10-11   x5-Lumbar fusion-retained hardware.(Dr. Louanne Skye)  . BREAST CYST EXCISION     bilateral breast, 3 cysts removed from each breast  . BREAST EXCISIONAL BIOPSY Right    x 3  . BREAST EXCISIONAL BIOPSY Left    x 3  . CARPAL TUNNEL RELEASE Bilateral   . Cervical biospy    .  COLONOSCOPY WITH PROPOFOL N/A 07/06/2015   Procedure: COLONOSCOPY WITH PROPOFOL;  Surgeon: [name], MD;  Location: [facility];  Service: Endoscopy;  Laterality: N/A;  . DOPPLER ECHOCARDIOGRAPHY  04/06/2012   AT [place] 55-60%  . I&D EXTREMITY Left 12/18/2016   Procedure: IRRIGATION AND DEBRIDEMENT LEFT FOOT, CLOSURE;  Surgeon: [name], MD;  Location: [place];  Service: Orthopedics;  Laterality: Left;  [name] Kitchen MULTIPLE TOOTH EXTRACTIONS    . NECK SURGERY  04-10-11   x3- cervical fusion with plating and screws-Dr. [name]  . TEE WITHOUT CARDIOVERSION N/A 04/06/2012   Procedure: TRANSESOPHAGEAL ECHOCARDIOGRAM (TEE);  Surgeon: [name], MD;  Location[facility];  Service: Cardiovascular;  Laterality: N/A;  . TRIGGER FINGER RELEASE Right    middle finger    . TRIGGER FINGER RELEASE Left 06/28/2016   Procedure: RELEASE TRIGGER FINGER LEFT 3RD FINGER;  Surgeon: [name], MD;  Location: [place];  Service: Orthopedics;  Laterality: Left;  . TRIGGER FINGER RELEASE Right 06/12/2016   Procedure: RIGHT INDEX FINGER TRIGGER RELEASE;  Surgeon: [name], MD;  Location: [place];  Service: Orthopedics;  Laterality: Right;  . TRIGGER FINGER RELEASE Right 01/19/2018   Procedure: RIGHT RING FINGER TRIGGER FINGER RELEASE;  Surgeon: [name], MD;  Location: [place];  Service: Orthopedics;  Laterality: Right;    Family History  Problem Relation Age of Onset  . Stroke Father   . Cancer Maternal Aunt        breast  . Breast cancer Maternal Aunt   . Asthma Other   . COPD Other   . Hypertension Other   . Diabetes Other     Social History   Socioeconomic History  . Marital status: Divorced    Spouse name: Not on file  . Number of children: Not on file  . Years of education: Not on file  . Highest education level: Not on file  Occupational History  . Not on file  Social Needs  . Financial resource strain: Not on file  . Food insecurity:    Worry: Never true    Inability: Often true  . Transportation needs:    Medical: Not on file    Non-medical: Not on file  Tobacco Use  . Smoking status: Former Smoker    Packs/day: 1.00    Years: 40.00    Pack years: 40.00    Types: Cigarettes    Start date: 01/28/1977    Last attempt to quit: 09/07/2017    Years since quitting: 0.3  . Smokeless tobacco: Never Used  . Tobacco comment: Quit prior to hospitalization using low dose Chantix  Substance and Sexual Activity  . Alcohol use: No    Alcohol/week: 0.0 standard drinks  . Drug use: Not Currently    Types: Marijuana, "Crack" cocaine    Comment: hx marijuana usemany years ago, Crack -none since 11/2015  . Sexual activity: Yes    Partners: Male  Lifestyle  . Physical activity:    Days per week: Not on file    Minutes per session: Not on file  .  Stress: Not on file  Relationships  . Social connections:    Talks on phone: Not on file    Gets together: Not on file    Attends religious service: Not on file    Active member of club or organization: Not on file    Attends meetings of clubs or organizations: Not on file    Relationship status: Not  on file  . Intimate partner violence:    Fear of current or ex partner: Not on file    Emotionally abused: Not on file    Physically abused: Not on file    Forced sexual activity: Not on file  Other Topics Concern  . Not on file  Social History Narrative   Current Social History 01/23/2017        Who lives at home: Patient lives alone in one level home 01/23/2017    Transportation: Patient has own vehicle and drives herself 11/24/2534   Important Relationships "Family and friends" 01/23/2017    Pets: None 01/23/2017   Education / Work:  12 th grade/ None 01/23/2017   Interests / Fun: "Play bingo on phone, watch TV, Be with family and friends, sit on my porch." 01/23/2017   Current Stressors: None 01/23/2017   Religious / Personal Beliefs: "God Jesus" 01/23/2017   Other: "I love everyone, I wake up with joy in my heart and go to sleep the same way. I'm a lovable person." 01/23/2017   L. Ducatte, RN, BSN                                                                                                   Physical Exam  Vital Signs and Nursing Notes reviewed Vitals:   01/25/18 0915 01/25/18 0930  BP: (!) 156/86 (!) 151/130  Pulse: 81 67  Resp:    Temp:    SpO2: 92% 94%    CONSTITUTIONAL: Well-appearing, NAD, obese NEURO:  Alert and oriented x 3, no focal deficits EYES:  eyes equal and reactive ENT/NECK:  no LAD, no JVD CARDIO: Regular rate, well-perfused, normal S1 and S2 PULM:  CTAB no wheezing or rhonchi GI/GU:  normal bowel sounds, non-distended, non-tender MSK/SPINE:  No gross deformities, no edema, tenderness to palpation to the base of the left thumb SKIN:  no rash,  atraumatic PSYCH:  Appropriate speech and behavior  Diagnostic and Interventional Summary    EKG Interpretation  Date/Time:  Sunday January 25 2018 08:43:13 EST Ventricular Rate:  68 PR Interval:    QRS Duration: 102 QT Interval:  438 QTC Calculation: 466 R Axis:   55 Text Interpretation:  Sinus rhythm Low voltage, precordial leads Confirmed by Gerlene Fee [phone]) on 01/25/2018 9:07:39 AM      Labs Reviewed  CBC - Abnormal; Notable for the following components:      Result Value   RBC 5.73 (*)    MCV 79.4 (*)    RDW 16.4 (*)    All other components within normal limits  BASIC METABOLIC PANEL - Abnormal; Notable for the following components:   Glucose, Bld 247 (*)    Creatinine, Ser 1.48 (*)    GFR calc non Af Amer 40 (*)    GFR calc Af Amer 46 (*)    All other components within normal limits  URINALYSIS, ROUTINE W REFLEX MICROSCOPIC - Abnormal; Notable for the following components:   Color, Urine STRAW (*)    All other components within normal limits  I-STAT VENOUS BLOOD GAS, ED -  Abnormal; Notable for the following components:   pH, Ven 7.453 (*)    pCO2, Ven 43.5 (*)    pO2, Ven 97.0 (*)    Bicarbonate 30.5 (*)    Acid-Base Excess 6.0 (*)    All other components within normal limits  BRAIN NATRIURETIC PEPTIDE  BLOOD GAS, VENOUS  I-STAT TROPONIN, ED    DG Chest 2 View  Final Result    DG Wrist Complete Left  Final Result      Medications  acetaminophen (TYLENOL) tablet 1,000 mg (has no administration in time range)  furosemide (LASIX) injection 60 mg (60 mg Intravenous Given 01/25/18 0951)     Procedures Critical Care  ED Course and Medical Decision Making  I have reviewed the triage vital signs and the nursing notes.  Pertinent labs & imaging results that were available during my care of the patient were reviewed by me and considered in my medical decision making (see below for details).  Patient has a difficult time explaining her symptoms prior  to the fall, initially described as "blacking out" but she did not lose consciousness.  Sounds more like an increased somnolence, falling asleep very easily and then being startled awake.  Still this is abnormal for her, considering CHF exacerbation, hypercarbia, arrhythmia.  Will evaluate with labs, low concern for traumatic injuries, will x-ray of the wrist.  Normal neurological exam, no evidence of trauma to the head, no blood thinners, no indication for CT imaging of the head at this time.  Patient continues to have a reassuring neurological exam.  Labs largely unremarkable, chest x-ray with mild CHF.  Patient made over 1100 cc of urine after 60 mg IV Lasix.  UA unremarkable.  Upon reassessment, patient again explains that it was more of a falling asleep and waking up abruptly, did not experience any dizziness or syncopal episode.  Reevaluation of the wrist reveals that the pain is located at the wrist joint on the dorsal aspect, no snuffbox tenderness.  Appropriate for discharge, advised close cardiology follow-up.  Has good follow-up appointment in place in 2 days.  After the discussed management above, the patient was determined to be safe for discharge.  The patient was in agreement with this plan and all questions regarding their care were answered.  ED return precautions were discussed and the patient will return to the ED with any significant worsening of condition.  Barth Kirks. Sedonia Small, [place] [email or web address]  Final Clinical Impressions(s) / ED Diagnoses     ICD-10-CM   1. Fall, initial encounter W19.XXXA   2. SOB (shortness of breath) R06.02 DG Chest 2 View    DG Chest 2 View    ED Discharge Orders    None         Maudie Flakes, MD 01/25/18 1257

## 2018-01-25 NOTE — ED Notes (Signed)
Pt transported to xray 

## 2018-01-25 NOTE — ED Triage Notes (Signed)
Pt woke up tonight to go to the bathroom and had a fall, hit head, no LOC, not on thinners. C/o of pain to L wrist. Pt also has had a 20lb weight gain over the past week with a hx of CHF, lasix was increased from 40-80 on Monday, wears 2L all the time

## 2018-01-26 ENCOUNTER — Encounter: Payer: Self-pay | Admitting: Student in an Organized Health Care Education/Training Program

## 2018-01-26 ENCOUNTER — Other Ambulatory Visit: Payer: Self-pay | Admitting: Podiatry

## 2018-01-26 DIAGNOSIS — E1151 Type 2 diabetes mellitus with diabetic peripheral angiopathy without gangrene: Secondary | ICD-10-CM

## 2018-01-26 DIAGNOSIS — B353 Tinea pedis: Secondary | ICD-10-CM

## 2018-01-27 ENCOUNTER — Other Ambulatory Visit: Payer: Self-pay | Admitting: Student in an Organized Health Care Education/Training Program

## 2018-01-27 ENCOUNTER — Encounter: Payer: Self-pay | Admitting: Physician Assistant

## 2018-01-27 ENCOUNTER — Emergency Department (HOSPITAL_COMMUNITY): Payer: Medicare Other

## 2018-01-27 ENCOUNTER — Encounter (INDEPENDENT_AMBULATORY_CARE_PROVIDER_SITE_OTHER): Payer: Self-pay | Admitting: Orthopaedic Surgery

## 2018-01-27 ENCOUNTER — Emergency Department (HOSPITAL_COMMUNITY)
Admission: EM | Admit: 2018-01-27 | Discharge: 2018-01-27 | Disposition: A | Discharge status: Home / Self Care | Payer: Medicare Other | Attending: Emergency Medicine | Admitting: Emergency Medicine

## 2018-01-27 ENCOUNTER — Encounter (HOSPITAL_COMMUNITY): Payer: Self-pay

## 2018-01-27 ENCOUNTER — Other Ambulatory Visit (INDEPENDENT_AMBULATORY_CARE_PROVIDER_SITE_OTHER): Payer: Self-pay

## 2018-01-27 ENCOUNTER — Ambulatory Visit (INDEPENDENT_AMBULATORY_CARE_PROVIDER_SITE_OTHER): Payer: Medicare Other | Admitting: Physician Assistant

## 2018-01-27 VITALS — BP 149/95 | HR 88 | Ht 70.0 in | Wt >= 6400 oz

## 2018-01-27 DIAGNOSIS — I509 Heart failure, unspecified: Secondary | ICD-10-CM | POA: Insufficient documentation

## 2018-01-27 DIAGNOSIS — J810 Acute pulmonary edema: Secondary | ICD-10-CM | POA: Diagnosis not present

## 2018-01-27 DIAGNOSIS — R600 Localized edema: Secondary | ICD-10-CM

## 2018-01-27 DIAGNOSIS — E8770 Fluid overload, unspecified: Secondary | ICD-10-CM | POA: Diagnosis not present

## 2018-01-27 DIAGNOSIS — R0902 Hypoxemia: Secondary | ICD-10-CM | POA: Diagnosis not present

## 2018-01-27 DIAGNOSIS — J449 Chronic obstructive pulmonary disease, unspecified: Secondary | ICD-10-CM | POA: Insufficient documentation

## 2018-01-27 DIAGNOSIS — E1122 Type 2 diabetes mellitus with diabetic chronic kidney disease: Secondary | ICD-10-CM | POA: Diagnosis not present

## 2018-01-27 DIAGNOSIS — N183 Chronic kidney disease, stage 3 (moderate): Secondary | ICD-10-CM | POA: Insufficient documentation

## 2018-01-27 DIAGNOSIS — E119 Type 2 diabetes mellitus without complications: Secondary | ICD-10-CM | POA: Diagnosis not present

## 2018-01-27 DIAGNOSIS — Z87891 Personal history of nicotine dependence: Secondary | ICD-10-CM | POA: Insufficient documentation

## 2018-01-27 DIAGNOSIS — Z79899 Other long term (current) drug therapy: Secondary | ICD-10-CM | POA: Insufficient documentation

## 2018-01-27 DIAGNOSIS — R0602 Shortness of breath: Secondary | ICD-10-CM | POA: Diagnosis not present

## 2018-01-27 DIAGNOSIS — I501 Left ventricular failure: Secondary | ICD-10-CM

## 2018-01-27 DIAGNOSIS — R52 Pain, unspecified: Secondary | ICD-10-CM | POA: Diagnosis not present

## 2018-01-27 DIAGNOSIS — E1165 Type 2 diabetes mellitus with hyperglycemia: Secondary | ICD-10-CM | POA: Diagnosis not present

## 2018-01-27 DIAGNOSIS — I471 Supraventricular tachycardia: Secondary | ICD-10-CM

## 2018-01-27 DIAGNOSIS — I130 Hypertensive heart and chronic kidney disease with heart failure and stage 1 through stage 4 chronic kidney disease, or unspecified chronic kidney disease: Secondary | ICD-10-CM | POA: Diagnosis not present

## 2018-01-27 DIAGNOSIS — R0789 Other chest pain: Secondary | ICD-10-CM

## 2018-01-27 DIAGNOSIS — Z794 Long term (current) use of insulin: Secondary | ICD-10-CM | POA: Diagnosis not present

## 2018-01-27 DIAGNOSIS — I110 Hypertensive heart disease with heart failure: Secondary | ICD-10-CM | POA: Diagnosis not present

## 2018-01-27 DIAGNOSIS — I10 Essential (primary) hypertension: Secondary | ICD-10-CM | POA: Diagnosis not present

## 2018-01-27 DIAGNOSIS — I5032 Chronic diastolic (congestive) heart failure: Secondary | ICD-10-CM

## 2018-01-27 LAB — BRAIN NATRIURETIC PEPTIDE: B Natriuretic Peptide: 36.9 pg/mL (ref 0.0–100.0)

## 2018-01-27 LAB — CBC WITH DIFFERENTIAL/PLATELET
Abs Immature Granulocytes: 0.03 10*3/uL (ref 0.00–0.07)
Basophils Absolute: 0 10*3/uL (ref 0.0–0.1)
Basophils Relative: 0 %
Eosinophils Absolute: 0.2 10*3/uL (ref 0.0–0.5)
Eosinophils Relative: 2 %
HCT: 44.4 % (ref 36.0–46.0)
Hemoglobin: 14.6 g/dL (ref 12.0–15.0)
Immature Granulocytes: 0 %
Lymphocytes Relative: 24 %
Lymphs Abs: 2.2 10*3/uL (ref 0.7–4.0)
MCH: 25.7 pg — ABNORMAL LOW (ref 26.0–34.0)
MCHC: 32.9 g/dL (ref 30.0–36.0)
MCV: 78 fL — AB (ref 80.0–100.0)
MONO ABS: 1.1 10*3/uL — AB (ref 0.1–1.0)
MONOS PCT: 11 %
Neutro Abs: 5.8 10*3/uL (ref 1.7–7.7)
Neutrophils Relative %: 63 %
Platelets: 344 10*3/uL (ref 150–400)
RBC: 5.69 MIL/uL — ABNORMAL HIGH (ref 3.87–5.11)
RDW: 16.8 % — ABNORMAL HIGH (ref 11.5–15.5)
WBC: 9.2 10*3/uL (ref 4.0–10.5)
nRBC: 0 % (ref 0.0–0.2)

## 2018-01-27 LAB — BASIC METABOLIC PANEL
Anion gap: 11 (ref 5–15)
BUN: 33 mg/dL — ABNORMAL HIGH (ref 6–20)
CO2: 31 mmol/L (ref 22–32)
Calcium: 8.9 mg/dL (ref 8.9–10.3)
Chloride: 101 mmol/L (ref 98–111)
Creatinine, Ser: 1.57 mg/dL — ABNORMAL HIGH (ref 0.44–1.00)
GFR calc Af Amer: 43 mL/min — ABNORMAL LOW (ref >60)
GFR calc non Af Amer: 37 mL/min — ABNORMAL LOW (ref >60)
GLUCOSE: 156 mg/dL — AB (ref 70–99)
Potassium: 4.2 mmol/L (ref 3.5–5.1)
Sodium: 143 mmol/L (ref 135–145)

## 2018-01-27 LAB — TROPONIN I: Troponin I: <0.03 ng/mL (ref <0.03)

## 2018-01-27 MED ORDER — METOLAZONE 2.5 MG PO TABS: ORAL_TABLET | ORAL | 0 refills | Status: DC

## 2018-01-27 MED ORDER — FUROSEMIDE 10 MG/ML IJ SOLN
80.0000 mg | Freq: Once | INTRAMUSCULAR | Status: AC
  Administered 2018-01-27: 80 mg via INTRAVENOUS
  Filled 2018-01-27: qty 8

## 2018-01-27 MED ORDER — HYDROCODONE-ACETAMINOPHEN 7.5-325 MG PO TABS: 1.0000 | ORAL_TABLET | Freq: Four times a day (QID) | ORAL | 0 refills | Status: DC | PRN

## 2018-01-27 MED ORDER — FUROSEMIDE 40 MG PO TABS: 80.0000 mg | ORAL_TABLET | Freq: Two times a day (BID) | ORAL | 0 refills | Status: DC

## 2018-01-27 NOTE — Patient Instructions (Signed)
Medication Instructions:  Start Metolazone 2.5 , take one tablet ONLY tomorrow morning. Take no more until advised to do so. Take it 30 minutes before Lasix medication  If you need a refill on your cardiac medications before your next appointment, please call your pharmacy.   Lab work: BMP on the day you return for a follow up. Arrive 30 minutes early.  If you have labs (blood work) drawn today and your tests are completely normal, you will receive your results only by: [name] Kitchen MyChart Message (if you have MyChart) OR . A paper copy in the mail If you have any lab test that is abnormal or we need to change your treatment, we will call you to review the results.   Follow-Up: At [facility], you and your health needs are our priority.  As part of our continuing mission to provide you with exceptional heart care, we have created designated Provider Care Teams.  These Care Teams include your primary Cardiologist (physician) and Advanced Practice Providers (APPs -  Physician Assistants and Nurse Practitioners) who all work together to provide you with the care you need, when you need it. [name] Kitchen Keep follow up appointment with Mary Deforest, PA-C in one week.

## 2018-01-27 NOTE — ED Triage Notes (Signed)
Increased SOB over the last two days, chest tightness. Pt has COPD and asthma. 2 L O2 at home. Pt ambulating with no difficulty.

## 2018-01-27 NOTE — Progress Notes (Signed)
Cardiology Office Note    Date:  05/06/1854   ID:  [name], DOB [birth year], MRN [id number]  PCP:  [name], MD  Cardiologist: Dr. Harrell Gave  Chief Complaint  Patient presents with  . Follow-up    seen for Dr. Harrell Gave    History of Present Illness:  [name] is a 52 y.o. female with PMH of morbid obesity, type II DM, chronic diastolic heart failure, hypertension and history of SVT.  Patient was initially admitted in early September for acute on chronic diastolic heart failure.  She was discharged on 40 mg twice daily of Lasix and 2 L home O2 with ambulation.  She also has sleep apnea and use CPAP at home.  Myoview obtained on 11/11/2017 was low risk with normal EF, possible apical septal infarct versus artifact, no ischemia.  She had has a history of tobacco abuse but quit in August 2019.  Patient was last seen by Dr. Harrell Gave on 01/08/2018, diltiazem was increased to 120 mg twice daily.  She underwent trigger finger release on 01/19/2018.  After the surgery, her weight increased from 404 pounds up to 417 pounds.  This eventually increased to 424 pounds on 01/22/2018.  Some of the increased volume overloaded likely also related to dietary indiscretion during the holiday season as well.  She contacted our office on 12/26 and was instructed to double up her Lasix to 80 mg twice daily for 3 days before returning for outpatient visit.  She slid off her bed and fell forward onto the floor on 01/25/2018.  During the ED visit, due to complaint of shortness of breath, she was given 60 mg IV Lasix.  Patient returned back to the emergency room around 4 AM this morning with chest tightness and shortness of breath.  Patient was given Lasix IV 80 mg x 1.  Her weight on arrival to the ED was 422 pounds.  Patient presents to cardiology office visit today.  Her weight has decreased significantly compared to the emergency room.  She has noticed increased urination.  However her weight is  still 10 pounds over compared to her dry weight.  We discussed various options, I plan to give her a single dose of metolazone to take tomorrow morning.  He is aware that metolazone should never be taken on a daily basis and that we only gave her one-time dose with tomorrow a.m.'s diuretic.  This will need to be taken 30 minutes prior to the Lasix.  I will bring her back in 1 week for reassessment.  I think, in the future, we can potentially consider a as needed metolazone every time her weight is over 410 pounds.   Past Medical History:  Diagnosis Date  . Anxiety   . Arthritis 04-10-11   hips, shoulders, back  . Asthma   . Bipolar disorder ([place])   . Cancer ([place]) 1993   cervical, no treatment done, went away per pt  . Cervical dysplasia or atypia 04-10-11   '93- once dx.-got pregnant-no intervention, then postpartum, no dysplasia found  . CHF (congestive heart failure) ([place])    no cardiologist 2014 dx, none now  . Chronic kidney disease    stage 3 kidney disease  . 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 Pathology showed Condyloma, gluteal CONDYLOMA ACUMINATUM.   [name] Kitchen COPD (chronic obstructive pulmonary disease) ([place])   . Diabetes mellitus without complication ([place])   . Dyspnea    with actity, sitting  .  Fall 12/16/2016  . Fibrocystic breast disease   . GERD (gastroesophageal reflux disease)   . Hepatitis    hep B-count is low at present,doesn't register  . Hypercalcemia   . Hyperlipidemia   . Hypertension   . Lung nodule 11/2017   right and left lung  . Migraine headache    none recent  . Morbid obesity ([place]) 03/27/2006  . Neuropathy   . Pneumonia    walking pneu 15 yrs. ago  . Skin lesion 03/15/2011   In gluteal crease now s/p removal by Dr. [name] of General Surgery on 3/12. Path shows condyloma.     . Sleep apnea 04-10-11   uses cpap, pt does not know settings  . Trigger finger    left third    Past Surgical History:  Procedure Laterality Date  .  ANAL FISTULECTOMY  04/17/2011   Procedure: FISTULECTOMY ANAL;  Surgeon: Imogene Burn. [name], MD;  Location: [facility];  Service: General;  Laterality: N/A;  Excision of Condyloma Gluteal Cleft   . BACK SURGERY  04-10-11   x5-Lumbar fusion-retained hardware.(Dr. Louanne Skye)  . BREAST CYST EXCISION     bilateral breast, 3 cysts removed from each breast  . BREAST EXCISIONAL BIOPSY Right    x 3  . BREAST EXCISIONAL BIOPSY Left    x 3  . CARPAL TUNNEL RELEASE Bilateral   . Cervical biospy    . COLONOSCOPY WITH PROPOFOL N/A 07/06/2015   Procedure: COLONOSCOPY WITH PROPOFOL;  Surgeon: [name], MD;  Location: [facility];  Service: Endoscopy;  Laterality: N/A;  . DOPPLER ECHOCARDIOGRAPHY  04/06/2012   AT [place] 55-60%  . I&D EXTREMITY Left 12/18/2016   Procedure: IRRIGATION AND DEBRIDEMENT LEFT FOOT, CLOSURE;  Surgeon: [name], MD;  Location: [facility];  Service: Orthopedics;  Laterality: Left;  [name] Kitchen MULTIPLE TOOTH EXTRACTIONS    . NECK SURGERY  04-10-11   x3- cervical fusion with plating and screws-Dr. [name]  . TEE WITHOUT CARDIOVERSION N/A 04/06/2012   Procedure: TRANSESOPHAGEAL ECHOCARDIOGRAM (TEE);  Surgeon: [name], MD;  Location: [facility] ENDOSCOPY;  Service: Cardiovascular;  Laterality: N/A;  . TRIGGER FINGER RELEASE Right    middle finger  . TRIGGER FINGER RELEASE Left 06/28/2016   Procedure: RELEASE TRIGGER FINGER LEFT 3RD FINGER;  Surgeon: [name], MD;  Location: [facility];  Service: Orthopedics;  Laterality: Left;  . TRIGGER FINGER RELEASE Right 06/12/2016   Procedure: RIGHT INDEX FINGER TRIGGER RELEASE;  Surgeon: [name], MD;  Location: [facility];  Service: Orthopedics;  Laterality: Right;  . TRIGGER FINGER RELEASE Right 01/19/2018   Procedure: RIGHT RING FINGER TRIGGER FINGER RELEASE;  Surgeon: [name], MD;  Location: [facility];  Service: Orthopedics;  Laterality: Right;    Current Medications: Outpatient Medications Prior to Visit  Medication Sig Dispense Refill  .  ACCU-CHEK AVIVA PLUS test strip TEST 4 TIMES DAILY 100 each 12  . ACCU-CHEK SOFTCLIX LANCETS lancets TEST 4 TIMES DAILY 200 each 6  . albuterol (PROAIR HFA) 108 (90 Base) MCG/ACT inhaler Inhale 2 puffs into the lungs every 6 (six) hours as needed for shortness of breath. 1 Inhaler 1  . allopurinol (ZYLOPRIM) 100 MG tablet Take 100 mg by mouth daily.    [name] Kitchen ammonium lactate (AMLACTIN) 12 % lotion Apply 1 application topically 2 (two) times daily as needed for dry skin. 400 g 0  . atorvastatin (LIPITOR) 40 MG tablet Take 1 tablet (40 mg total) by mouth daily. 90 tablet 3  .  baclofen (LIORESAL) 10 MG tablet Take 2 tablets (20 mg total) by mouth 3 (three) times daily. 180 tablet 1  . blood glucose meter kit and supplies KIT Dispense based on patient and insurance preference. Use up to four times daily as directed. (FOR ICD-9 250.00, 250.01). 1 each 0  . busPIRone (BUSPAR) 10 MG tablet Take 1 tablet (10 mg total) by mouth 3 (three) times daily. 90 tablet 11  . Capsaicin-Menthol (PAIN RELIEF EX) Apply 1 application topically 3 (three) times daily as needed (shoulder pain).    . citalopram (CELEXA) 40 MG tablet TAKE 1 TABLET BY MOUTH EVERY DAY (Patient taking differently: Take 40 mg by mouth every morning. ) 90 tablet 0  . diclofenac sodium (VOLTAREN) 1 % GEL APPLY 2 GRAMS TO AFFECTED AREA 4 TIMES A DAY (Patient taking differently: Apply 2 g topically 4 (four) times daily. ) 300 g 2  . diltiazem (CARDIZEM CD) 120 MG 24 hr capsule Take 1 capsule (120 mg total) by mouth 2 (two) times daily. 180 capsule 3  . docusate sodium (COLACE) 100 MG capsule Take 200 mg by mouth 2 (two) times daily.    . fluticasone (FLONASE) 50 MCG/ACT nasal spray Place 2 sprays into both nostrils 2 (two) times daily. 16 g 1  . gabapentin (NEURONTIN) 300 MG capsule TAKE 2 CAPSULES (600 MG TOTAL) BY MOUTH 3 (THREE) TIMES DAILY. 540 capsule 2  . hydrOXYzine (VISTARIL) 100 MG capsule Take 1 capsule (100 mg total) by mouth 3 (three) times  daily as needed for itching. 30 capsule 0  . Insulin Glargine (LANTUS SOLOSTAR) 100 UNIT/ML Solostar Pen Inject 10 Units into the skin daily. 15 mL 3  . insulin lispro (HUMALOG KWIKPEN) 100 UNIT/ML KiwkPen Inject 0.03 mLs (3 Units total) into the skin 3 (three) times daily before meals. 15 mL 2  . Insulin Pen Needle (B-D UF III MINI PEN NEEDLES) 31G X 5 MM MISC CHECK SUGARS 4 TIMES A DAY BEFORE MEALS AND AT BEDTIME. 100 each 12  . JARDIANCE 25 MG TABS tablet TAKE 1 TABLET BY MOUTH EVERY DAY (Patient taking differently: Take 25 mg by mouth every morning. Give in the morning with or without food.) 90 tablet 0  . loratadine (CLARITIN) 10 MG tablet Take 1 tablet (10 mg total) by mouth daily. 90 tablet 0  . losartan (COZAAR) 100 MG tablet Take 1 tablet (100 mg total) by mouth daily. 90 tablet 3  . montelukast (SINGULAIR) 10 MG tablet Take 1 tablet (10 mg total) by mouth at bedtime. AS DIRECTED 90 tablet 3  . Multiple Vitamins-Minerals (MULTIVITAMIN WITH MINERALS) tablet Take 1 tablet by mouth daily.    . mupirocin ointment (BACTROBAN) 2 % Apply 1 application topically 2 (two) times daily. 22 g 0  . NON FORMULARY Uses a C-PAP at bedtime    . nystatin (MYCOSTATIN) 100000 UNIT/ML suspension Take 5 mLs (500,000 Units total) by mouth 4 (four) times daily. Swish and swallow. (Patient taking differently: Take 5 mLs by mouth daily as needed (mouth sores). Swish and swallow.) 60 mL 3  . omeprazole (PRILOSEC) 20 MG capsule Take 1 capsule (20 mg total) by mouth daily as needed. (Patient taking differently: Take 20 mg by mouth daily as needed (reflux/heartburn). ) 30 capsule 0  . oxybutynin (DITROPAN XL) 10 MG 24 hr tablet Take 1 tablet (10 mg total) by mouth at bedtime. 30 tablet 0  . OXYGEN Inhale 2 L into the lungs continuous.    . promethazine (PHENERGAN) 25  MG tablet Take 1 tablet (25 mg total) by mouth every 6 (six) hours as needed for nausea. 30 tablet 1  . TRELEGY ELLIPTA 100-62.5-25 MCG/INH AEPB INHALE 1  PUFF INTO THE LUNGS DAILY (Patient taking differently: Inhale 1 puff into the lungs daily. ) 60 each 2  . Vitamin D, Ergocalciferol, (DRISDOL) 50000 units CAPS capsule Take 50,000 Units by mouth every 30 (thirty) days.     . furosemide (LASIX) 40 MG tablet TAKE 1 TABLET BY MOUTH TWICE A DAY (Patient taking differently: Take 40 mg by mouth 2 (two) times daily. ) 60 tablet 0  . ketoconazole (NIZORAL) 2 % cream Apply 1 fingertip amount to each foot daily. (Patient taking differently: Apply 1 application topically 2 (two) times daily. Apply 1 fingertip amount to each foot TWICE DAILY) 30 g 0   Facility-Administered Medications Prior to Visit  Medication Dose Route Frequency Provider Last Rate Last Dose  . technetium tetrofosmin (TC-MYOVIEW) injection 82.5 millicurie  05.3 millicurie Intravenous Once PRN Turner, Eber Hong, MD         Allergies:   Chantix [varenicline tartrate]; Corticosteroids; Methadone hcl; Aspirin; and Levofloxacin   Social History   Socioeconomic History  . Marital status: Divorced    Spouse name: Not on file  . Number of children: Not on file  . Years of education: Not on file  . Highest education level: Not on file  Occupational History  . Not on file  Social Needs  . Financial resource strain: Not on file  . Food insecurity:    Worry: Never true    Inability: Often true  . Transportation needs:    Medical: Not on file    Non-medical: Not on file  Tobacco Use  . Smoking status: Former Smoker    Packs/day: 1.00    Years: 40.00    Pack years: 40.00    Types: Cigarettes    Start date: 01/28/1977    Last attempt to quit: 09/07/2017    Years since quitting: 0.3  . Smokeless tobacco: Never Used  . Tobacco comment: Quit prior to hospitalization using low dose Chantix  Substance and Sexual Activity  . Alcohol use: No    Alcohol/week: 0.0 standard drinks  . Drug use: Not Currently    Types: Marijuana, "Crack" cocaine    Comment: hx marijuana usemany years ago, Crack  -none since 11/2015  . Sexual activity: Yes    Partners: Male  Lifestyle  . Physical activity:    Days per week: Not on file    Minutes per session: Not on file  . Stress: Not on file  Relationships  . Social connections:    Talks on phone: Not on file    Gets together: Not on file    Attends religious service: Not on file    Active member of club or organization: Not on file    Attends meetings of clubs or organizations: Not on file    Relationship status: Not on file  Other Topics Concern  . Not on file  Social History Narrative   Current Social History 01/23/2017        Who lives at home: Patient lives alone in one level home 01/23/2017    Transportation: Patient has own vehicle and drives herself 97/67/3419   Important Relationships "Family and friends" 01/23/2017    Pets: None 01/23/2017   Education / Work:  12 th grade/ None 01/23/2017   Interests / Fun: "Play bingo on phone, watch TV, Be with family  and friends, sit on my porch." 01/23/2017   Current Stressors: None 01/23/2017   Religious / Personal Beliefs: "God Jesus" 01/23/2017   Other: "I love everyone, I wake up with joy in my heart and go to sleep the same way. I'm a lovable person." 01/23/2017   L. Ducatte, RN, BSN                                                                                                   Family History:  The patient's family history includes Asthma in an other family member; Breast cancer in her maternal aunt; COPD in an other family member; Cancer in her maternal aunt; Diabetes in an other family member; Hypertension in an other family member; Stroke in her father.   ROS:   Please see the history of present illness.    ROS All other systems reviewed and are negative.   PHYSICAL EXAM:   VS:  BP (!) 149/95   Pulse 88   Ht '5\' 10"'  (1.778 m)   Wt (!) 415 lb (188.2 kg)   LMP 04/10/2006   BMI 59.55 kg/m    GEN: Well nourished, well developed, in no acute distress  HEENT: normal  Neck:  no JVD, carotid bruits, or masses Cardiac: RRR; no murmurs, rubs, or gallops,no edema  Respiratory:  clear to auscultation bilaterally, normal work of breathing GI: soft, nontender, nondistended, + BS MS: no deformity or atrophy  Skin: warm and dry, no rash Neuro:  Alert and Oriented x 3, Strength and sensation are intact Psych: euthymic mood, full affect  Wt Readings from Last 3 Encounters:  01/27/18 (!) 415 lb (188.2 kg)  01/27/18 (!) 422 lb 3.2 oz (191.5 kg)  01/19/18 (!) 404 lb (183.3 kg)      Studies/Labs Reviewed:   EKG:  EKG is not ordered today.    Recent Labs: 10/01/2017: TSH 0.419 10/07/2017: Magnesium 1.9 01/15/2018: ALT 28 01/27/2018: B Natriuretic Peptide 36.9; BUN 33; Creatinine, Ser 1.57; Hemoglobin 14.6; Platelets 344; Potassium 4.2; Sodium 143   Lipid Panel    Component Value Date/Time   CHOL 145 10/27/2017 0944   TRIG 98 10/27/2017 0944   HDL 55 10/27/2017 0944   CHOLHDL 2.6 10/27/2017 0944   CHOLHDL 4.0 04/17/2015 0917   VLDL 34 (H) 04/17/2015 0917   LDLCALC 70 10/27/2017 0944   LDLDIRECT 114 (H) 09/15/2012 1559    Additional studies/ records that were reviewed today include:   Echo 10/01/2017 LV EF: 55% -   60% Study Conclusions  - Left ventricle: The cavity size was normal. Wall thickness was   normal. Systolic function was normal. The estimated ejection   fraction was in the range of 55% to 60%. Wall motion was normal;   there were no regional wall motion abnormalities. Doppler   parameters are consistent with abnormal left ventricular   relaxation (grade 1 diastolic dysfunction).    ASSESSMENT:    1. Edema of left lower extremity   2. Chest pressure   3. Hypervolemia, unspecified hypervolemia type   4. Controlled type 2 diabetes mellitus without complication, without  long-term current use of insulin ([place])   5. Morbid obesity ([place])   6. Chronic diastolic heart failure ([place])   7. SVT (supraventricular tachycardia) ([facility])      PLAN:    In order of problems listed above:  1. Lower extremity edema: Continue to be volume overloaded despite a single dose of IV Lasix in the emergency room.  I have given her 10 tablets of metolazone, she is aware metolazone is very harsh on the kidney and it should never be taken on a daily basis.  I instructed her to take a single dose of metolazone with tomorrow morning's diuretic.  She should not take any more metolazone after that unless otherwise instructed by [place].  I will bring her back in 1 week for reassessment.  Her volume overloaded likely related to her body habitus.  2. Chest pressure: Her chest pressure seems to occur more often with hypervolemia.  We will reassess after diuresis  3. Morbid obesity: Patient is aware that additional weight loss is highly recommended to improve her symptoms  4. History of SVT: Well-controlled on current medication    Medication Adjustments/Labs and Tests Ordered: Current medicines are reviewed at length with the patient today.  Concerns regarding medicines are outlined above.  Medication changes, Labs and Tests ordered today are listed in the Patient Instructions below. Patient Instructions  Medication Instructions:  Start Metolazone 2.5 , take one tablet ONLY tomorrow morning. Take no more until advised to do so. Take it 30 minutes before Lasix medication  If you need a refill on your cardiac medications before your next appointment, please call your pharmacy.   Lab work: BMP on the day you return for a follow up. Arrive 30 minutes early.  If you have labs (blood work) drawn today and your tests are completely normal, you will receive your results only by: [name] Kitchen MyChart Message (if you have MyChart) OR . A paper copy in the mail If you have any lab test that is abnormal or we need to change your treatment, we will call you to review the results.   Follow-Up: At [facility], you and your health needs are our priority.  As part of our continuing  mission to provide you with exceptional heart care, we have created designated Provider Care Teams.  These Care Teams include your primary Cardiologist (physician) and Advanced Practice Providers (APPs -  Physician Assistants and Nurse Practitioners) who all work together to provide you with the care you need, when you need it. [name] Kitchen Keep follow up appointment with Almyra Deforest, PA-C in one week.       Hilbert Corrigan, [place]  01/29/2018 11:45 PM    [facility] [place], [place], [place]  [address] Phone: [phone]; Fax: [phone]

## 2018-01-27 NOTE — ED Notes (Signed)
Bed: GJ15 Expected date:  Expected time:  Means of arrival:  Comments: EMS 52 yo female with chronic SOB-O2 dependent HR 110 CBG 268

## 2018-01-27 NOTE — ED Provider Notes (Addendum)
[facility] DEPT Provider Note: Georgena Spurling, MD, FACEP  CSN: [id number] MRN: [id number] ARRIVAL: 01/27/18 at [place]: [facility]   HISTORY OF PRESENT ILLNESS  53/61/44 3:15 AM [name] is a 51 y.o. female with morbid obesity, COPD and CHF.  She is here with a 2-day history of worsening shortness of breath.  She describes this as chest tightness, which means difficulty pulling in deep breaths, as well as chest soreness which means pain in her anterior chest with deep breathing or movement.  Symptoms are moderate and worse with exertion.  She also has had a 5 pound weight gain since her previous visit 2 days ago.  She is currently on Lasix 80 mg twice daily and oxygen 2 L by nasal cannula around-the-clock.    Past Medical History:  Diagnosis Date  . Anxiety   . Arthritis 04-10-11   hips, shoulders, back  . Asthma   . Bipolar disorder ([place])   . Cancer ([place]) 1993   cervical, no treatment done, went away per pt  . Cervical dysplasia or atypia 04-10-11   '93- once dx.-got pregnant-no intervention, then postpartum, no dysplasia found  . CHF (congestive heart failure) ([place])    no cardiologist 2014 dx, none now  . Chronic kidney disease    stage 3 kidney disease  . 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 Pathology showed Condyloma, gluteal CONDYLOMA ACUMINATUM.   [name] Kitchen COPD (chronic obstructive pulmonary disease) ([place])   . Diabetes mellitus without complication ([place])   . Dyspnea    with actity, sitting  . Fall 12/16/2016  . Fibrocystic breast disease   . GERD (gastroesophageal reflux disease)   . Hepatitis    hep B-count is low at present,doesn't register  . Hypercalcemia   . Hyperlipidemia   . Hypertension   . Lung nodule 11/2017   right and left lung  . Migraine headache    none recent  . Morbid obesity ([place]) 03/27/2006  . Neuropathy   . Pneumonia    walking pneu 15 yrs. ago  . Skin lesion  03/15/2011   In gluteal crease now s/p removal by Dr. [name] of General Surgery on 3/12. Path shows condyloma.     . Sleep apnea 04-10-11   uses cpap, pt does not know settings  . Trigger finger    left third    Past Surgical History:  Procedure Laterality Date  . ANAL FISTULECTOMY  04/17/2011   Procedure: FISTULECTOMY ANAL;  Surgeon: Imogene Burn. [name], MD;  Location: [facility];  Service: General;  Laterality: N/A;  Excision of Condyloma Gluteal Cleft   . BACK SURGERY  04-10-11   x5-Lumbar fusion-retained hardware.(Dr. Louanne Skye)  . BREAST CYST EXCISION     bilateral breast, 3 cysts removed from each breast  . BREAST EXCISIONAL BIOPSY Right    x 3  . BREAST EXCISIONAL BIOPSY Left    x 3  . CARPAL TUNNEL RELEASE Bilateral   . Cervical biospy    . COLONOSCOPY WITH PROPOFOL N/A 07/06/2015   Procedure: COLONOSCOPY WITH PROPOFOL;  Surgeon: [name], MD;  Location: [facility];  Service: Endoscopy;  Laterality: N/A;  . DOPPLER ECHOCARDIOGRAPHY  04/06/2012   AT [facility] 55-60%  . I&D EXTREMITY Left 12/18/2016   Procedure: IRRIGATION AND DEBRIDEMENT LEFT FOOT, CLOSURE;  Surgeon: [name], MD;  Location: [facility];  Service: Orthopedics;  Laterality: Left;  [name] Kitchen MULTIPLE TOOTH EXTRACTIONS    .  NECK SURGERY  04-10-11   x3- cervical fusion with plating and screws-Dr. [name]  . TEE WITHOUT CARDIOVERSION N/A 04/06/2012   Procedure: TRANSESOPHAGEAL ECHOCARDIOGRAM (TEE);  Surgeon: [name], MD;  Location: [facility] ENDOSCOPY;  Service: Cardiovascular;  Laterality: N/A;  . TRIGGER FINGER RELEASE Right    middle finger  . TRIGGER FINGER RELEASE Left 06/28/2016   Procedure: RELEASE TRIGGER FINGER LEFT 3RD FINGER;  Surgeon: [name], MD;  Location: [facility];  Service: Orthopedics;  Laterality: Left;  . TRIGGER FINGER RELEASE Right 06/12/2016   Procedure: RIGHT INDEX FINGER TRIGGER RELEASE;  Surgeon: [name], MD;  Location: [facility];  Service: Orthopedics;  Laterality: Right;  . TRIGGER FINGER RELEASE  Right 01/19/2018   Procedure: RIGHT RING FINGER TRIGGER FINGER RELEASE;  Surgeon: [name], MD;  Location: [facility];  Service: Orthopedics;  Laterality: Right;    Family History  Problem Relation Age of Onset  . Stroke Father   . Cancer Maternal Aunt        breast  . Breast cancer Maternal Aunt   . Asthma Other   . COPD Other   . Hypertension Other   . Diabetes Other     Social History   Tobacco Use  . Smoking status: Former Smoker    Packs/day: 1.00    Years: 40.00    Pack years: 40.00    Types: Cigarettes    Start date: 01/28/1977    Last attempt to quit: 09/07/2017    Years since quitting: 0.3  . Smokeless tobacco: Never Used  . Tobacco comment: Quit prior to hospitalization using low dose Chantix  Substance Use Topics  . Alcohol use: No    Alcohol/week: 0.0 standard drinks  . Drug use: Not Currently    Types: Marijuana, "Crack" cocaine    Comment: hx marijuana usemany years ago, Crack -none since 11/2015    Prior to Admission medications   Medication Sig Start Date End Date Taking? Authorizing Provider  albuterol (PROAIR HFA) 108 (90 Base) MCG/ACT inhaler Inhale 2 puffs into the lungs every 6 (six) hours as needed for shortness of breath. 01/09/18  Yes Hensel, Jamal Collin, MD  allopurinol (ZYLOPRIM) 100 MG tablet Take 100 mg by mouth daily.   Yes Rosita Fire, MD  ammonium lactate (AMLACTIN) 12 % lotion Apply 1 application topically 2 (two) times daily as needed for dry skin. 12/12/17  Yes [name], MD  atorvastatin (LIPITOR) 40 MG tablet Take 1 tablet (40 mg total) by mouth daily. 12/31/17  Yes [name], MD  baclofen (LIORESAL) 10 MG tablet Take 2 tablets (20 mg total) by mouth 3 (three) times daily. 01/09/18  Yes Hensel, Jamal Collin, MD  busPIRone (BUSPAR) 10 MG tablet Take 1 tablet (10 mg total) by mouth 3 (three) times daily. 05/29/17  Yes [name], MD  Capsaicin-Menthol (PAIN RELIEF EX) Apply 1 application topically 3 (three) times daily as needed  (shoulder pain).   Yes [provider]  citalopram (CELEXA) 40 MG tablet TAKE 1 TABLET BY MOUTH EVERY DAY Patient taking differently: Take 40 mg by mouth every morning.  01/22/18  Yes Sela Hilding, MD  diclofenac sodium (VOLTAREN) 1 % GEL APPLY 2 GRAMS TO AFFECTED AREA 4 TIMES A DAY Patient taking differently: Apply 2 g topically 4 (four) times daily.  09/30/17  Yes Jessy Oto, MD  diltiazem (CARDIZEM CD) 120 MG 24 hr capsule Take 1 capsule (120 mg total) by mouth 2 (two) times daily. 01/08/18  01/03/19 Yes [name], MD  docusate sodium (COLACE) 100 MG capsule Take 200 mg by mouth 2 (two) times daily.   Yes [provider]  fluticasone (FLONASE) 50 MCG/ACT nasal spray Place 2 sprays into both nostrils 2 (two) times daily. 01/22/18  Yes Sela Hilding, MD  furosemide (LASIX) 40 MG tablet TAKE 1 TABLET BY MOUTH TWICE A DAY Patient taking differently: Take 40 mg by mouth 2 (two) times daily.  12/29/17  Yes [name], MD  gabapentin (NEURONTIN) 300 MG capsule TAKE 2 CAPSULES (600 MG TOTAL) BY MOUTH 3 (THREE) TIMES DAILY. 11/20/17  Yes Jessy Oto, MD  hydrOXYzine (VISTARIL) 100 MG capsule Take 1 capsule (100 mg total) by mouth 3 (three) times daily as needed for itching. 01/23/18  Yes [name], MD  Insulin Glargine (LANTUS SOLOSTAR) 100 UNIT/ML Solostar Pen Inject 10 Units into the skin daily. 10/16/17  Yes Hensel, Jamal Collin, MD  insulin lispro (HUMALOG KWIKPEN) 100 UNIT/ML KiwkPen Inject 0.03 mLs (3 Units total) into the skin 3 (three) times daily before meals. 05/29/17  Yes [name], MD  JARDIANCE 25 MG TABS tablet TAKE 1 TABLET BY MOUTH EVERY DAY Patient taking differently: Take 25 mg by mouth every morning. Give in the morning with or without food. 01/12/18  Yes [name], MD  ketoconazole (NIZORAL) 2 % cream Apply 1 fingertip amount to each foot daily. Patient taking differently: Apply 1 application topically 2 (two) times daily. Apply 1  fingertip amount to each foot TWICE DAILY 06/25/17  Yes Gardiner Barefoot, DPM  loratadine (CLARITIN) 10 MG tablet Take 1 tablet (10 mg total) by mouth daily. 05/29/17  Yes [name], MD  losartan (COZAAR) 100 MG tablet Take 1 tablet (100 mg total) by mouth daily. 05/29/17  Yes [name], MD  montelukast (SINGULAIR) 10 MG tablet Take 1 tablet (10 mg total) by mouth at bedtime. AS DIRECTED 05/29/17  Yes [name], MD  Multiple Vitamins-Minerals (MULTIVITAMIN WITH MINERALS) tablet Take 1 tablet by mouth daily.   Yes [provider]  mupirocin ointment (BACTROBAN) 2 % Apply 1 application topically 2 (two) times daily. 10/29/17  Yes [name], MD  nystatin (MYCOSTATIN) 100000 UNIT/ML suspension Take 5 mLs (500,000 Units total) by mouth 4 (four) times daily. Swish and swallow. Patient taking differently: Take 5 mLs by mouth daily as needed (mouth sores). Swish and swallow. 11/28/17  Yes [name], MD  omeprazole (PRILOSEC) 20 MG capsule Take 1 capsule (20 mg total) by mouth daily as needed. Patient taking differently: Take 20 mg by mouth daily as needed (reflux/heartburn).  01/23/18  Yes [name], MD  oxybutynin (DITROPAN XL) 10 MG 24 hr tablet Take 1 tablet (10 mg total) by mouth at bedtime. 10/07/17  Yes Martyn Malay, MD  promethazine (PHENERGAN) 25 MG tablet Take 1 tablet (25 mg total) by mouth every 6 (six) hours as needed for nausea. 01/19/18  Yes [name], MD  TRELEGY ELLIPTA 100-62.5-25 MCG/INH AEPB INHALE 1 PUFF INTO THE LUNGS DAILY Patient taking differently: Inhale 1 puff into the lungs daily.  01/05/18  Yes [name], MD  Vitamin D, Ergocalciferol, (DRISDOL) 50000 units CAPS capsule Take 50,000 Units by mouth every 30 (thirty) days.    Yes [provider]  ACCU-CHEK AVIVA PLUS test strip TEST 4 TIMES DAILY 10/13/17   [name], MD  ACCU-CHEK SOFTCLIX LANCETS lancets TEST 4 TIMES DAILY 01/12/18   [name], MD  blood glucose meter kit and supplies KIT  Dispense based on patient and insurance preference. Use up to four times daily as directed. (FOR ICD-9 250.00, 250.01). 12/20/16   Bonnita Hollow, MD  famotidine (PEPCID) 10 MG tablet Take 1 tablet (10 mg total) by mouth daily. Patient not taking: Reported on 01/09/2018 10/05/17   Lorella Nimrod, MD  HYDROcodone-acetaminophen (NORCO) 7.5-325 MG tablet Take 1-2 tablets by mouth daily as needed for moderate pain. Patient not taking: Reported on 01/27/2018 01/23/18   [name], MD  Insulin Pen Needle (B-D UF III MINI PEN NEEDLES) 31G X 5 MM MISC CHECK SUGARS 4 TIMES A DAY BEFORE MEALS AND AT BEDTIME. 08/25/17   [name], MD  NON FORMULARY Uses a C-PAP at bedtime    [provider]  OXYGEN Inhale 2 L into the lungs continuous.    [provider]  varenicline (CHANTIX) 0.5 MG tablet Take 1 tablet (0.5 mg total) by mouth 2 (two) times daily. One daily x 3 days BID Patient not taking: Reported on 01/09/2018 09/01/17   Zenia Resides, MD  sucralfate (CARAFATE) 1 G tablet Take 1 tablet (1 g total) by mouth daily. 04/27/10 04/09/11  Arnetha Gula, MD    Allergies Chantix [varenicline tartrate]; Corticosteroids; Methadone hcl; Aspirin; and Levofloxacin   REVIEW OF SYSTEMS  Negative except as noted here or in the History of Present Illness.   PHYSICAL EXAMINATION  Initial Vital Signs Height '5\' 10"'  (1.778 m), weight (!) 191.5 kg, last menstrual period 04/10/2006, SpO2 94 %.  Examination General: Well-developed, well-nourished female in no acute distress; appearance consistent with age of record HENT: normocephalic; atraumatic Eyes: pupils equal, round and reactive to light; extraocular muscles intact Neck: supple Heart: regular rate and rhythm; distant sounds Lungs: Distant sounds Abdomen: soft; obese; nontender;  bowel sounds present Extremities: No deformity; full range of motion; edema of lower legs Neurologic: Awake, alert and oriented; motor function intact in  all extremities and symmetric; no facial droop Skin: Warm and dry Psychiatric: Normal mood and affect   RESULTS  Summary of this visit's results, reviewed by myself:   EKG Interpretation  Date/Time:  Tuesday January 27 2018 04:39:51 EST Ventricular Rate:  76 PR Interval:    QRS Duration: 96 QT Interval:  423 QTC Calculation: 476 R Axis:   45 Text Interpretation:  Sinus rhythm Low voltage, precordial leads No significant change was found Confirmed by Keiva Dina, Jenny Reichmann [phone]) on 01/27/2018 4:52:15 AM      Laboratory Studies: Results for orders placed or performed during the hospital encounter of 01/27/18 (from the past 24 hour(s))  CBC with Differential/Platelet     Status: Abnormal   Collection Time: 01/27/18  5:35 AM  Result Value Ref Range   WBC 9.2 4.0 - 10.5 K/uL   RBC 5.69 (H) 3.87 - 5.11 MIL/uL   Hemoglobin 14.6 12.0 - 15.0 g/dL   HCT 44.4 36.0 - 46.0 %   MCV 78.0 (L) 80.0 - 100.0 fL   MCH 25.7 (L) 26.0 - 34.0 pg   MCHC 32.9 30.0 - 36.0 g/dL   RDW 16.8 (H) 11.5 - 15.5 %   Platelets 344 150 - 400 K/uL   nRBC 0.0 0.0 - 0.2 %   Neutrophils Relative % 63 %   Neutro Abs 5.8 1.7 - 7.7 K/uL   Lymphocytes Relative 24 %   Lymphs Abs 2.2 0.7 - 4.0 K/uL   Monocytes Relative 11 %   Monocytes Absolute 1.1 (H) 0.1 - 1.0 K/uL   Eosinophils Relative 2 %  Eosinophils Absolute 0.2 0.0 - 0.5 K/uL   Basophils Relative 0 %   Basophils Absolute 0.0 0.0 - 0.1 K/uL   Immature Granulocytes 0 %   Abs Immature Granulocytes 0.03 0.00 - 0.07 K/uL  Basic metabolic panel     Status: Abnormal   Collection Time: 01/27/18  5:35 AM  Result Value Ref Range   Sodium 143 135 - 145 mmol/L   Potassium 4.2 3.5 - 5.1 mmol/L   Chloride 101 98 - 111 mmol/L   CO2 31 22 - 32 mmol/L   Glucose, Bld 156 (H) 70 - 99 mg/dL   BUN 33 (H) 6 - 20 mg/dL   Creatinine, Ser 1.57 (H) 0.44 - 1.00 mg/dL   Calcium 8.9 8.9 - 10.3 mg/dL   GFR calc non Af Amer 37 (L) >60 mL/min   GFR calc Af Amer 43 (L) >60 mL/min    Anion gap 11 5 - 15  Brain natriuretic peptide     Status: None   Collection Time: 01/27/18  5:35 AM  Result Value Ref Range   B Natriuretic Peptide 36.9 0.0 - 100.0 pg/mL  Troponin I - ONCE - STAT     Status: None   Collection Time: 01/27/18  5:35 AM  Result Value Ref Range   Troponin I <0.03 <0.03 ng/mL   Imaging Studies: Dg Chest 2 View  Result Date: 01/27/2018 CLINICAL DATA:  Acute onset of shortness of breath. EXAM: CHEST - 2 VIEW COMPARISON:  Chest radiograph performed 01/25/2018 FINDINGS: The lungs are well-aerated. Vascular congestion is noted. Increased interstitial markings raise concern for pulmonary edema. There is no evidence of pleural effusion or pneumothorax. The lateral view is suboptimal due to limitations in penetration. The heart is mildly enlarged. No acute osseous abnormalities are seen. Cervical spinal fusion hardware is noted. IMPRESSION: Vascular congestion and mild cardiomegaly. Increased interstitial markings raise concern for pulmonary edema. Electronically Signed   By: Garald Balding M.D.   On: 01/27/2018 05:10   Dg Chest 2 View  Result Date: 01/25/2018 CLINICAL DATA:  52 year old female with history of fall with injury to the head. EXAM: CHEST - 2 VIEW COMPARISON:  Chest x-ray 09/30/2017. FINDINGS: There is cephalization of the pulmonary vasculature and slight indistinctness of the interstitial markings suggestive of mild pulmonary edema. No pneumothorax. No definite pleural effusions. Mild cardiomegaly. Upper mediastinal contours are within normal limits. Orthopedic fixation hardware projecting over the cervical spine. Bony thorax appears intact. IMPRESSION: 1. The appearance the chest suggests mild congestive heart failure, as above. 2. No definite radiographic evidence of significant acute traumatic injury to the thorax. Electronically Signed   By: Vinnie Langton M.D.   On: 01/25/2018 08:04   Dg Wrist Complete Left  Result Date: 01/25/2018 CLINICAL DATA:  Pt  woke up tonight to go to the bathroom and had a fall, hit head, no LOC, not on thinners. C/o of pain to L wrist. Pt also has had a 20lb weight gain over the past week with a hx of CHF, lasix was increased from 40-80 on Monday, wears 2L all the time EXAM: LEFT WRIST - COMPLETE 3+ VIEW COMPARISON:  None. FINDINGS: There is no evidence of fracture or dislocation. There is no evidence of arthropathy or other focal bone abnormality. Soft tissues are unremarkable. IMPRESSION: Negative. Electronically Signed   By: Lucrezia Europe M.D.   On: 01/25/2018 08:07    ED COURSE and MDM  Nursing notes and initial vitals signs, including pulse oximetry, reviewed.  Vitals:  01/27/18 0413 01/27/18 0415 01/27/18 0417 01/27/18 0642  BP:   (!) 133/56 (!) 132/93  Pulse:   76 74  Resp:   18 20  Temp:   98.4 F (36.9 C)   TempSrc:   Oral   SpO2: 94%  95% 94%  Weight:  (!) 191.5 kg    Height:       6:50 AM Patient's dyspnea has significantly improved after diuresis following IV Lasix.  She states she is ready to go home.  PROCEDURES    ED DIAGNOSES     ICD-10-CM   1. Shortness of breath R06.02   2. Pulmonary edema with congestive heart failure ([place]) I50.1        Tay Whitwell, MD 01/27/18 [id number]    Shanon Rosser, MD 01/27/18 [phone]

## 2018-01-29 ENCOUNTER — Encounter: Payer: Self-pay | Admitting: Physician Assistant

## 2018-01-29 ENCOUNTER — Ambulatory Visit (INDEPENDENT_AMBULATORY_CARE_PROVIDER_SITE_OTHER): Payer: Medicare Other | Admitting: Neurology

## 2018-01-29 ENCOUNTER — Telehealth: Payer: Self-pay | Admitting: Podiatry

## 2018-01-29 DIAGNOSIS — J449 Chronic obstructive pulmonary disease, unspecified: Secondary | ICD-10-CM

## 2018-01-29 DIAGNOSIS — Z9981 Dependence on supplemental oxygen: Secondary | ICD-10-CM

## 2018-01-29 DIAGNOSIS — R9431 Abnormal electrocardiogram [ECG] [EKG]: Secondary | ICD-10-CM

## 2018-01-29 DIAGNOSIS — G4733 Obstructive sleep apnea (adult) (pediatric): Secondary | ICD-10-CM | POA: Diagnosis not present

## 2018-01-29 DIAGNOSIS — J4489 Other specified chronic obstructive pulmonary disease: Secondary | ICD-10-CM

## 2018-01-29 DIAGNOSIS — G4720 Circadian rhythm sleep disorder, unspecified type: Secondary | ICD-10-CM

## 2018-01-29 DIAGNOSIS — Z8679 Personal history of other diseases of the circulatory system: Secondary | ICD-10-CM

## 2018-01-29 DIAGNOSIS — Z6841 Body Mass Index (BMI) 40.0 and over, adult: Secondary | ICD-10-CM

## 2018-01-29 NOTE — Telephone Encounter (Signed)
Pt called asking for an appt to return her diabetic shoes. I asked what was wrong with them and she said she could not afford the $80.00 that Liliane Channel told her about when she picked the shoes up. She said she was not told anything about having to pay until she picked them up. I explained that we cannot return the inserts because they are custom made for but would discuss with Liliane Channel and see if we are able to do anything.

## 2018-01-30 ENCOUNTER — Encounter: Payer: Self-pay | Admitting: Student in an Organized Health Care Education/Training Program

## 2018-01-30 ENCOUNTER — Other Ambulatory Visit: Payer: Self-pay

## 2018-01-30 ENCOUNTER — Ambulatory Visit (INDEPENDENT_AMBULATORY_CARE_PROVIDER_SITE_OTHER): Payer: Medicare Other | Admitting: Student in an Organized Health Care Education/Training Program

## 2018-01-30 DIAGNOSIS — W19XXXD Unspecified fall, subsequent encounter: Secondary | ICD-10-CM | POA: Insufficient documentation

## 2018-01-30 DIAGNOSIS — E1122 Type 2 diabetes mellitus with diabetic chronic kidney disease: Secondary | ICD-10-CM | POA: Diagnosis not present

## 2018-01-30 DIAGNOSIS — Z794 Long term (current) use of insulin: Secondary | ICD-10-CM

## 2018-01-30 DIAGNOSIS — I50813 Acute on chronic right heart failure: Secondary | ICD-10-CM

## 2018-01-30 DIAGNOSIS — N183 Chronic kidney disease, stage 3 unspecified: Secondary | ICD-10-CM

## 2018-01-30 NOTE — Assessment & Plan Note (Signed)
Uncertain etiology of falls, but sounds multifactorial. Wondering if worsening respiratory status over the weekend contributed to her history of "nodding off" and falling. Her respiratory status is improving with diuresis and her weight is down 9 lbs today. Mentation is back to baseline. Gave strict return precautions if she experiences changes in mental status in the future.

## 2018-01-30 NOTE — Progress Notes (Signed)
Subjective:    [name] - 53 y.o. female MRN [id number]  Date of birth: [birth year]  HPI  [name] is here for ED follow up.  Patient is a 53 year old morbidly obese female with numerous chronic medical conditions including restrictive lung disease secondary to obesity, obesity hypoventilation syndrome, congestive heart failure, and chronic kidney disease.  She presents today for emergency room follow-up.  2 emergency room visits in the last week.  The first 1 was because she had a fall at home.  She reports that she had 1 day of "nodding off" and then sustained a fall.  She reports that she caught herself on her hands and knees, however she did hit her head on the wall.  She was seen in the emergency room without any neurologic deficits.  She continues to complain of headache, however she is not having any neurologic deficits today.  No dizziness.  She has had nausea over the last few days but no vomiting.  She is not taking anything for pain.  She is not having any numbness, weakness, tingling, vision change.  Her second emergency room visit was for acute on chronic respiratory failure likely secondary to her right-sided congestive heart failure.  She was volume overloaded at that time, she is following now with cardiology and is being diuresed with follow-up on Tuesday.  She is currently on Lasix and metolazone.  She has lost 9 pounds since her last visit and continues to diurese.  She reports that her breathing is improving with diuresis.  Health Maintenance:  Health Maintenance Due  Topic Date Due  . FOOT EXAM  07/25/1973    -  reports that she quit smoking about 4 months ago. Her smoking use included cigarettes. She started smoking about 41 years ago. She has a 40.00 pack-year smoking history. She has never used smokeless tobacco. - Review of Systems: Per HPI. - Past Medical History: Patient Active Problem List   Diagnosis Date Noted  . Falls, subsequent encounter  01/30/2018  . Open wound of left foot 11/04/2017  . Essential hypertension 10/27/2017  . Solitary pulmonary nodule 10/07/2017  . Acute on chronic right-sided congestive heart failure ([place])   . Restrictive lung disease secondary to obesity 10/01/2017  . Polycythemia, secondary 10/01/2017  . Chronic viral hepatitis B without delta-agent ([place]) 07/08/2017  . COPD (chronic obstructive pulmonary disease) with chronic bronchitis ([place]) 06/27/2017  . Chronic kidney disease (CKD), stage IV (severe) ([place]) 01/14/2017  . Type 2 diabetes mellitus with stage 3 chronic kidney disease, with long-term current use of insulin ([place]) 12/12/2016  . Urge incontinence of urine 12/05/2016  . Trigger finger, right ring finger 07/15/2016  . Class 3 severe obesity due to excess calories with serious comorbidity and body mass index (BMI) of 50.0 to 59.9 in adult ([place]) 10/02/2012  . Chronic diastolic heart failure ([place]) 04/07/2012  . GERD 01/26/2010  . Hypertension 08/04/2008  . OSA (obstructive sleep apnea) 02/03/2008  . FIBROCYSTIC BREAST DISEASE 10/28/2006  . Hyperlipidemia 03/27/2006  . Obesity hypoventilation syndrome ([place]) 03/27/2006  . Former tobacco use 03/27/2006  . Depression with anxiety 03/27/2006   - Medications: reviewed and updated Current Outpatient Medications  Medication Sig Dispense Refill  . ACCU-CHEK AVIVA PLUS test strip TEST 4 TIMES DAILY 100 each 12  . ACCU-CHEK SOFTCLIX LANCETS lancets TEST 4 TIMES DAILY 200 each 6  . albuterol (PROAIR HFA) 108 (90 Base) MCG/ACT inhaler Inhale 2 puffs into the lungs every 6 (six) hours as  needed for shortness of breath. 1 Inhaler 1  . allopurinol (ZYLOPRIM) 100 MG tablet Take 100 mg by mouth daily.    [name] Kitchen ammonium lactate (AMLACTIN) 12 % lotion Apply 1 application topically 2 (two) times daily as needed for dry skin. 400 g 0  . atorvastatin (LIPITOR) 40 MG tablet Take 1 tablet (40 mg total) by mouth daily. 90 tablet 3  . baclofen (LIORESAL) 10 MG tablet Take  2 tablets (20 mg total) by mouth 3 (three) times daily. 180 tablet 1  . blood glucose meter kit and supplies KIT Dispense based on patient and insurance preference. Use up to four times daily as directed. (FOR ICD-9 250.00, 250.01). 1 each 0  . busPIRone (BUSPAR) 10 MG tablet Take 1 tablet (10 mg total) by mouth 3 (three) times daily. 90 tablet 11  . Capsaicin-Menthol (PAIN RELIEF EX) Apply 1 application topically 3 (three) times daily as needed (shoulder pain).    . citalopram (CELEXA) 40 MG tablet TAKE 1 TABLET BY MOUTH EVERY DAY (Patient taking differently: Take 40 mg by mouth every morning. ) 90 tablet 0  . diclofenac sodium (VOLTAREN) 1 % GEL APPLY 2 GRAMS TO AFFECTED AREA 4 TIMES A DAY (Patient taking differently: Apply 2 g topically 4 (four) times daily. ) 300 g 2  . diltiazem (CARDIZEM CD) 120 MG 24 hr capsule Take 1 capsule (120 mg total) by mouth 2 (two) times daily. 180 capsule 3  . docusate sodium (COLACE) 100 MG capsule Take 200 mg by mouth 2 (two) times daily.    . fluticasone (FLONASE) 50 MCG/ACT nasal spray Place 2 sprays into both nostrils 2 (two) times daily. 16 g 1  . furosemide (LASIX) 40 MG tablet Take 2 tablets (80 mg total) by mouth 2 (two) times daily. 60 tablet 0  . gabapentin (NEURONTIN) 300 MG capsule TAKE 2 CAPSULES (600 MG TOTAL) BY MOUTH 3 (THREE) TIMES DAILY. 540 capsule 2  . HYDROcodone-acetaminophen (NORCO) 7.5-325 MG tablet Take 1 tablet by mouth every 6 (six) hours as needed for moderate pain. 20 tablet 0  . hydrOXYzine (VISTARIL) 100 MG capsule Take 1 capsule (100 mg total) by mouth 3 (three) times daily as needed for itching. 30 capsule 0  . Insulin Glargine (LANTUS SOLOSTAR) 100 UNIT/ML Solostar Pen Inject 10 Units into the skin daily. 15 mL 3  . insulin lispro (HUMALOG KWIKPEN) 100 UNIT/ML KiwkPen Inject 0.03 mLs (3 Units total) into the skin 3 (three) times daily before meals. 15 mL 2  . Insulin Pen Needle (B-D UF III MINI PEN NEEDLES) 31G X 5 MM MISC CHECK  SUGARS 4 TIMES A DAY BEFORE MEALS AND AT BEDTIME. 100 each 12  . JARDIANCE 25 MG TABS tablet TAKE 1 TABLET BY MOUTH EVERY DAY (Patient taking differently: Take 25 mg by mouth every morning. Give in the morning with or without food.) 90 tablet 0  . ketoconazole (NIZORAL) 2 % cream Apply 1 fingertip amount to each foot daily. 30 g 0  . loratadine (CLARITIN) 10 MG tablet Take 1 tablet (10 mg total) by mouth daily. 90 tablet 0  . losartan (COZAAR) 100 MG tablet Take 1 tablet (100 mg total) by mouth daily. 90 tablet 3  . metolazone (ZAROXOLYN) 2.5 MG tablet Take 1 tablet ONLY on 01/28/2018, then no more until instructed. 10 tablet 0  . montelukast (SINGULAIR) 10 MG tablet Take 1 tablet (10 mg total) by mouth at bedtime. AS DIRECTED 90 tablet 3  . Multiple Vitamins-Minerals (MULTIVITAMIN WITH  MINERALS) tablet Take 1 tablet by mouth daily.    . mupirocin ointment (BACTROBAN) 2 % Apply 1 application topically 2 (two) times daily. 22 g 0  . NON FORMULARY Uses a C-PAP at bedtime    . nystatin (MYCOSTATIN) 100000 UNIT/ML suspension Take 5 mLs (500,000 Units total) by mouth 4 (four) times daily. Swish and swallow. (Patient taking differently: Take 5 mLs by mouth daily as needed (mouth sores). Swish and swallow.) 60 mL 3  . omeprazole (PRILOSEC) 20 MG capsule Take 1 capsule (20 mg total) by mouth daily as needed. (Patient taking differently: Take 20 mg by mouth daily as needed (reflux/heartburn). ) 30 capsule 0  . oxybutynin (DITROPAN XL) 10 MG 24 hr tablet Take 1 tablet (10 mg total) by mouth at bedtime. 30 tablet 0  . OXYGEN Inhale 2 L into the lungs continuous.    . promethazine (PHENERGAN) 25 MG tablet Take 1 tablet (25 mg total) by mouth every 6 (six) hours as needed for nausea. 30 tablet 1  . TRELEGY ELLIPTA 100-62.5-25 MCG/INH AEPB INHALE 1 PUFF INTO THE LUNGS DAILY (Patient taking differently: Inhale 1 puff into the lungs daily. ) 60 each 2  . Vitamin D, Ergocalciferol, (DRISDOL) 50000 units CAPS capsule  Take 50,000 Units by mouth every 30 (thirty) days.       Review of Systems See HPI     Objective:   Physical Exam BP 135/80   Pulse 82   Temp 97.8 F (36.6 C) (Oral)   Wt (!) 413 lb (187.3 kg)   LMP 04/10/2006   SpO2 93% Comment: 2L O2  BMI 59.26 kg/m  Gen: chronically ill morbidly obese female with oxygen HEENT: NCAT, PERRL, clear conjunctiva, oropharynx clear, supple neck CV: RRR, distant heart sounds, no edema, capillary refill brisk  Resp: CTABL though distant respirations due to body habitus, no wheezes, non-labored Abd: SNTND, BS present, no guarding or organomegaly Skin: no rashes, normal turgor  Neuro: no gross deficits, 5/5 strength UE and LE bilaterally, CN II-XII intact Psych: good insight, alert and oriented   Assessment & Plan:   Acute on chronic right-sided congestive heart failure [facility]) Patient is following with cardiology and currently undergoing diuresis.  She has follow-up with them.  We will continue to follow along with her plan.  Type 2 diabetes mellitus with stage 3 chronic kidney disease, with long-term current use of insulin ([place]) Patient was having difficulty with an error on her glucometer.  Pharmacy replaced the battery and the glucometer was functioning when she left the office.  We will follow-up for next A1c and diabetes check at her next visit.  Falls, subsequent encounter Uncertain etiology of falls, but sounds multifactorial. Wondering if worsening respiratory status over the weekend contributed to her history of "nodding off" and falling. Her respiratory status is improving with diuresis and her weight is down 9 lbs today. Mentation is back to baseline. Gave strict return precautions if she experiences changes in mental status in the future.   No orders of the defined types were placed in this encounter.   No orders of the defined types were placed in this encounter.   [name], MD,MS,  PGY3 01/30/2018 2:43 PM

## 2018-01-30 NOTE — Assessment & Plan Note (Signed)
Patient is following with cardiology and currently undergoing diuresis.  She has follow-up with them.  We will continue to follow along with her plan.

## 2018-01-30 NOTE — Patient Instructions (Signed)
It was a pleasure seeing you today in our clinic.   If your headaches worsen or fail to go away, or if you develop vomiting, dizziness, confusion these would be reasons to call us and we may need you to go back to the ED.  Our clinic's number is [phone]. Please call with questions or concerns about what we discussed today.  Be well, Dr. Burr Medico

## 2018-01-30 NOTE — Assessment & Plan Note (Signed)
Patient was having difficulty with an error on her glucometer.  Pharmacy replaced the battery and the glucometer was functioning when she left the office.  We will follow-up for next A1c and diabetes check at her next visit.

## 2018-01-31 ENCOUNTER — Other Ambulatory Visit: Payer: Self-pay | Admitting: Family Medicine

## 2018-02-02 ENCOUNTER — Telehealth: Payer: Self-pay

## 2018-02-02 NOTE — Procedures (Signed)
PATIENT'S NAME:  Larkin, Alfred DOB:      [birth year]      MR#:    [id number]     DATE OF RECORDING: 01/29/2018 REFERRING M.D.: Dorris Singh, MD Study Performed:  Split-Night Titration Study HISTORY: 53 year old woman with a history of COPD, congestive heart failure with oxygen dependence, urinary incontinence, diabetes, neuropathy, hypertension, hyperlipidemia, status post multiple surgeries, and morbid obesity, who was previously diagnosed with obstructive sleep apnea and placed on CPAP therapy. She presents for re-evaluation. The patient endorsed the Epworth Sleepiness Scale at 15/24 points. The patient's weight 405 pounds with a height of 70 (inches), resulting in a BMI of 58.1 kg/m2. The patient's neck circumference measured 18.6 inches.  CURRENT MEDICATIONS: Proair, Zyloprim, Amlactin, Lipitor, Lioresal, Buspar, Celexa, Voltaren, Cardizem, Colace, Jardiance, Pepcid, Flonase, Lasix, Neurontin, Norco, Vistaril, Lantus, Hmalog, Nizoral, Claritin, Cozaar, Singulair, Multivitamins, Bactroban, Myocostatin  PROCEDURE:  This is a multichannel digital polysomnogram utilizing the Somnostar 11.2 system.  Electrodes and sensors were applied and monitored per AASM Specifications.   EEG, EOG, Chin and Limb EMG, were sampled at 200 Hz.  ECG, Snore and Nasal Pressure, Thermal Airflow, Respiratory Effort, CPAP Flow and Pressure, Oximetry was sampled at 50 Hz. Digital video and audio were recorded.      BASELINE STUDY WITHOUT CPAP RESULTS:  Lights Out was at 20:59 and Lights On at 04:56 for the night, split start on 00:30, epoch 432. Total recording time (TRT) was 211.5, with a total sleep time (TST) of 129 minutes. The patient's sleep latency was 10.5 minutes. REM sleep was absent. The sleep efficiency was 61. %.    SLEEP ARCHITECTURE: WASO (Wake after sleep onset) was 79.5 minutes, Stage N1 was 4 minutes, Stage N2 was 83.5 minutes, Stage N3 was 41 minutes and Stage R (REM sleep) was 0.5 minutes.  The percentages  were Stage N1 3.1%, Stage N2 64.7%, Stage N3 31.8% and Stage R (REM sleep) was absent. The arousals were noted as: 7 were spontaneous, 6 were associated with PLMs, 46 were associated with respiratory events.  RESPIRATORY ANALYSIS:  There were a total of 171 respiratory events:  85 obstructive apneas, 0 central apneas and 0 mixed apneas with a total of 85 apneas and an apnea index (AI) of 39.5. There were 86 hypopneas with a hypopnea index of 40. The patient also had 0 respiratory event related arousals (RERAs).  Snoring was noted.     The total APNEA/HYPOPNEA INDEX (AHI) was 79.5 /hour and the total RESPIRATORY DISTURBANCE INDEX was 79.5 /hour.  0 events occurred in REM sleep and 172 events in NREM. The REM AHI was 0, /hour versus a non-REM AHI of 79.8 /hour. The patient spent 156 minutes sleep time in the supine position 225 minutes in non-supine. The supine AHI was 68.5 /hour versus a non-supine AHI of 157.5 /hour.  OXYGEN SATURATION & C02:  The wake baseline 02 saturation was 86%, with the lowest being 65%. Time spent below 89% saturation equaled 119 minutes. She was placed on O2 at 22:14, due to severe desaturation, at 2 lpm.    PERIODIC LIMB MOVEMENTS: The patient had a total of 9 Periodic Limb Movements.  The Periodic Limb Movement (PLM) index was 4.2 /hour and the PLM Arousal index was 2.8 /hour.  Audio and video analysis did not show any abnormal or unusual movements, behaviors, phonations or vocalizations. The patient took 2 bathroom breaks. Moderate snoring was noted. The EKG showed PACs.  TITRATION STUDY WITH CPAP RESULTS:   The patient  was fitted with a medium Simplus FFM. CPAP was initiated at 5 cmH20 with heated humidity per AASM split night standards and pressure was advanced to 14 cm, but AHI was 10.4/hour and O2 nadir with supplemental O2, which was started at 01:16, epoch 523. She was switched to BiPAP of 15/11 cmH20 because of hypopneas, apneas and desaturations. She was titrated to  a pressure of 20/16 cmH20, at which point her AHI was 0/hour, O2 nadir of 88% on 2 lpm bleed-in.   Total recording time (TRT) was 266 minutes, with a total sleep time (TST) of 252 minutes. The patient's sleep latency was 1.5 minutes. REM latency was 16.5 minutes.  The sleep efficiency was 94.7 %.    SLEEP ARCHITECTURE: Wake after sleep was 12 minutes, Stage N1 0 minutes, Stage N2 47 minutes, Stage N3 112.5 minutes and Stage R (REM sleep) 92.5 minutes. The percentages were: Stage N1 0%, Stage N2 18.7%, Stage N3 44.6% and Stage R (REM sleep) 36.7%, which is increased, and in keeping with rebound. The arousals were noted as: 6 were spontaneous, 0 were associated with PLMs, 7 were associated with respiratory events.  RESPIRATORY ANALYSIS:  There were a total of 82 respiratory events: 10 obstructive apneas, 13 central apneas and 0 mixed apneas with a total of 23 apneas and an apnea index (AI) of 5.5. There were 59 hypopneas with a hypopnea index of 14. /hour. The patient also had 0 respiratory event related arousals (RERAs).      The total APNEA/HYPOPNEA INDEX  (AHI) was 19.5 /hour and the total RESPIRATORY DISTURBANCE INDEX was 19.5 /hour.  15 events occurred in REM sleep and 67 events in NREM. The REM AHI was 9.7 /hour versus a non-REM AHI of 25.2 /hour. REM sleep was achieved on a pressure of  cm/h2o (AHI was  .) The patient spent 17% of total sleep time in the supine position. The supine AHI was 37.6 /hour, versus a non-supine AHI of 15.8/hour.  OXYGEN SATURATION & C02:  The wake baseline 02 saturation was 82%, with the lowest being 63%. Time spent below 89% saturation equaled 168 minutes.  PERIODIC LIMB MOVEMENTS: The patient had a total of 0 Periodic Limb Movements. The Periodic Limb Movement (PLM) index was 0 /hour and the PLM Arousal index was 0 /hour.  Post-study, the patient indicated that sleep was better than usual. POLYSOMNOGRAPHY IMPRESSION :   1. Obstructive Sleep Apnea (OSA)  2. Oxygen  dependence 3. Dysfunctions associated with sleep stages or arousals from sleep 4. Non-specific abnormal EKG  RECOMMENDATIONS:  1. This patient has severe obstructive sleep apnea and evidence of oxygen dependence during sleep and wakefulness. She responded well on standard BiPAP therapy. I will, therefore, start the patient on home BiPAP treatment at a pressure of 20/16 cm via medium FFM, with heated humidity and 2 lpm O2 bleed-in. The patient should be reminded to be fully compliant with PAP therapy to improve sleep related symptoms and decrease long term cardiovascular risks. Please note that untreated obstructive sleep apnea may carry additional perioperative morbidity. Patients with significant obstructive sleep apnea should receive perioperative PAP therapy and the surgeons and particularly the anesthesiologist should be informed of the diagnosis and the severity of the sleep disordered breathing. 2. This study shows sleep fragmentation and abnormal sleep stage percentages; these are nonspecific findings and per se do not signify an intrinsic sleep disorder or a cause for the patient's sleep-related symptoms. Causes include (but are not limited to) the first night effect  of the sleep study, circadian rhythm disturbances, medication effect or an underlying mood disorder or medical problem.  3. The study showed PACs on single lead EKG; clinical correlation is recommended.  4. The patient should be cautioned not to drive, work at heights, or operate dangerous or heavy equipment when tired or sleepy. Review and reiteration of good sleep hygiene measures should be pursued with any patient. 5. The patient will be seen in follow-up in the sleep clinic at [facility] for discussion of the test results, symptom and treatment compliance review, further management strategies, etc. The referring provider will be notified of the test results.   I certify that I have reviewed the entire raw data recording prior to the  issuance of this report in accordance with the Standards of Accreditation of the American Academy of Sleep Medicine (AASM)    Star Age, MD, PhD Diplomat, American Board of Neurology and Sleep Medicine (Neurology and Sleep Medicine)

## 2018-02-02 NOTE — Addendum Note (Signed)
Addended by: Star Age on: 02/02/2018 08:06 AM   Modules accepted: Orders

## 2018-02-02 NOTE — Telephone Encounter (Signed)
To blue team.

## 2018-02-02 NOTE — Telephone Encounter (Signed)
We can return the shoes if she hasn't worn them, but we cannot return the inserts. The 20 percent co-pay needs to be explained up front and perhaps we get them to sign our in-house finaciall respons form at time of casting.

## 2018-02-02 NOTE — Telephone Encounter (Signed)
I called pt. I advised pt that Dr. Rexene Alberts reviewed their sleep study results and found that pt has severe osa but did well with bipap with O2 supplementation. Dr. Rexene Alberts recommends that pt start a bipap with O2 at home. I reviewed PAP compliance expectations with the pt. Pt is agreeable to starting a BiPAP. I advised pt that an order will be sent to a DME, [facility], and [facility] will call the pt within about one week after they file with the pt's insurance. [facility] will show the pt how to use the machine, fit for masks, and troubleshoot the BiPAP if needed. A follow up appt was made for insurance purposes with Dr. Rexene Alberts on 04/22/18 at 8:30am. Pt verbalized understanding to arrive 15 minutes early and bring their BiPAP. A letter with all of this information in it will be mailed to the pt as a reminder. I verified with the pt that the address we have on file is correct. Pt verbalized understanding of results. Pt had no questions at this time but was encouraged to call back if questions arise. I have sent the order to [facility] and have received confirmation that they have received the order.

## 2018-02-02 NOTE — Telephone Encounter (Signed)
-----   Message from Star Age, MD sent at 02/02/2018  8:06 AM EST ----- Patient referred by Dr. Owens Shark, seen by me on 12/30/17, split night sleep study on 01/29/18. Please call and notify patient that the recent sleep study confirmed the diagnosis of severe OSA and need for O2 supplementation. She did well with BiPAP during the study and O2 at 2 lpm. Therefore, I would like start the patient on BiPAP therapy at home by prescribing a machine for home use. I placed the order in the chart.  Please advise patient that we need a follow up appointment with either myself or one of our nurse practitioners in about 10 weeks post set-up to check for how the patient is feeling and how well the patient is using the machine, etc. Please go ahead and schedule the appointment, while you have the patient on the phone and make sure patient understands the importance of keeping this window for the FU appointment, as it is often an insurance requirement. Failing to adhere to this may result in losing coverage for sleep apnea treatment, at which point most patients are left with a choice of returning the machine or paying out of pocket (and we want neither of this to happen!).  Please re-enforce the importance of compliance with treatment and the need for [place] to monitor compliance data - again an insurance requirement and usually a good feedback for the patient as far as how they are doing.  Also remind patient, that any PAP machine or mask issues should be first addressed with the DME company, who provided the machine/mask.  Please ask if patient has a preference regarding DME company, may depend on the insurance too.  Please arrange for CPAP set up at home through a DME company of patient's choice.  Once you have spoken to the patient you can close the phone encounter. Please fax/route report to referring provider, thanks,   Star Age, MD, PhD [facility])

## 2018-02-02 NOTE — Progress Notes (Signed)
Patient referred by Dr. Owens Shark, seen by me on 12/30/17, split night sleep study on 01/29/18. Please call and notify patient that the recent sleep study confirmed the diagnosis of severe OSA and need for O2 supplementation. She did well with BiPAP during the study and O2 at 2 lpm. Therefore, I would like start the patient on BiPAP therapy at home by prescribing a machine for home use. I placed the order in the chart.  Please advise patient that we need a follow up appointment with either myself or one of our nurse practitioners in about 10 weeks post set-up to check for how the patient is feeling and how well the patient is using the machine, etc. Please go ahead and schedule the appointment, while you have the patient on the phone and make sure patient understands the importance of keeping this window for the FU appointment, as it is often an insurance requirement. Failing to adhere to this may result in losing coverage for sleep apnea treatment, at which point most patients are left with a choice of returning the machine or paying out of pocket (and we want neither of this to happen!).  Please re-enforce the importance of compliance with treatment and the need for [place] to monitor compliance data - again an insurance requirement and usually a good feedback for the patient as far as how they are doing.  Also remind patient, that any PAP machine or mask issues should be first addressed with the DME company, who provided the machine/mask.  Please ask if patient has a preference regarding DME company, may depend on the insurance too.  Please arrange for CPAP set up at home through a DME company of patient's choice.  Once you have spoken to the patient you can close the phone encounter. Please fax/route report to referring provider, thanks,   Star Age, MD, PhD [facility])

## 2018-02-03 ENCOUNTER — Other Ambulatory Visit: Payer: Self-pay

## 2018-02-03 ENCOUNTER — Ambulatory Visit (INDEPENDENT_AMBULATORY_CARE_PROVIDER_SITE_OTHER): Payer: Medicare Other

## 2018-02-03 ENCOUNTER — Other Ambulatory Visit: Payer: Self-pay | Admitting: *Deleted

## 2018-02-03 ENCOUNTER — Ambulatory Visit (INDEPENDENT_AMBULATORY_CARE_PROVIDER_SITE_OTHER): Payer: Medicare Other | Admitting: Orthopaedic Surgery

## 2018-02-03 VITALS — BP 130/80 | HR 70 | Temp 97.8°F | Ht 69.0 in | Wt >= 6400 oz

## 2018-02-03 DIAGNOSIS — J9621 Acute and chronic respiratory failure with hypoxia: Secondary | ICD-10-CM | POA: Diagnosis not present

## 2018-02-03 DIAGNOSIS — Z0000 Encounter for general adult medical examination without abnormal findings: Secondary | ICD-10-CM

## 2018-02-03 DIAGNOSIS — M65341 Trigger finger, right ring finger: Secondary | ICD-10-CM

## 2018-02-03 MED ORDER — HYDROCODONE-ACETAMINOPHEN 5-325 MG PO TABS: 1.0000 | ORAL_TABLET | Freq: Every day | ORAL | 0 refills | Status: DC | PRN

## 2018-02-03 NOTE — Progress Notes (Signed)
Post-Op Visit Note   Patient: [name]           Date of Birth: [birth year]           MRN: [id number] Visit Date: 02/03/2018 PCP: [name], MD   Assessment & Plan:  Chief Complaint:  Chief Complaint  Patient presents with  . Right Hand - Follow-up   Visit Diagnoses:  1. Trigger finger, right ring finger     Plan: [name] is two-week status post right ring trigger finger release.  She is doing well overall.  She did fall recently and landed on the hand but the incision remained intact.  She is endorsing some increased pain but no triggering.  Her surgical incision is clean dry and intact and healed.  We remove the sutures today.  No heavy lifting for another couple weeks.  We will see her back in 4 weeks for recheck.  Follow-Up Instructions: Return in about 4 weeks (around 03/03/2018).   Orders:  No orders of the defined types were placed in this encounter.  Meds ordered this encounter  Medications  . HYDROcodone-acetaminophen (NORCO) 5-325 MG tablet    Sig: Take 1 tablet by mouth daily as needed.    Dispense:  10 tablet    Refill:  0    Imaging: No results found.  PMFS History: Patient Active Problem List   Diagnosis Date Noted  . Falls, subsequent encounter 01/30/2018  . Open wound of left foot 11/04/2017  . Essential hypertension 10/27/2017  . Solitary pulmonary nodule 10/07/2017  . Acute on chronic right-sided congestive heart failure ([place])   . Restrictive lung disease secondary to obesity 10/01/2017  . Polycythemia, secondary 10/01/2017  . Chronic viral hepatitis B without delta-agent ([place]) 07/08/2017  . COPD (chronic obstructive pulmonary disease) with chronic bronchitis ([place]) 06/27/2017  . Chronic kidney disease (CKD), stage IV (severe) ([place]) 01/14/2017  . Type 2 diabetes mellitus with stage 3 chronic kidney disease, with long-term current use of insulin ([place]) 12/12/2016  . Urge incontinence of urine 12/05/2016  . Trigger finger, right ring finger  07/15/2016  . Class 3 severe obesity due to excess calories with serious comorbidity and body mass index (BMI) of 50.0 to 59.9 in adult ([place]) 10/02/2012  . Chronic diastolic heart failure ([place]) 04/07/2012  . GERD 01/26/2010  . Hypertension 08/04/2008  . OSA (obstructive sleep apnea) 02/03/2008  . FIBROCYSTIC BREAST DISEASE 10/28/2006  . Hyperlipidemia 03/27/2006  . Obesity hypoventilation syndrome ([place]) 03/27/2006  . Former tobacco use 03/27/2006  . Depression with anxiety 03/27/2006   Past Medical History:  Diagnosis Date  . Anxiety   . Arthritis 04-10-11   hips, shoulders, back  . Asthma   . Bipolar disorder ([place])   . Cancer ([place]) 1993   cervical, no treatment done, went away per pt  . Cervical dysplasia or atypia 04-10-11   '93- once dx.-got pregnant-no intervention, then postpartum, no dysplasia found  . CHF (congestive heart failure) ([place])    no cardiologist 2014 dx, none now  . Chronic kidney disease    stage 3 kidney disease  . 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 Pathology showed Condyloma, gluteal CONDYLOMA ACUMINATUM.   [name] Kitchen COPD (chronic obstructive pulmonary disease) ([place])   . Diabetes mellitus without complication ([place])   . Dyspnea    with actity, sitting  . Fall 12/16/2016  . Fibrocystic breast disease   . GERD (gastroesophageal reflux disease)   . Hepatitis    hep  B-count is low at present,doesn't register  . Hypercalcemia   . Hyperlipidemia   . Hypertension   . Lung nodule 11/2017   right and left lung  . Migraine headache    none recent  . Morbid obesity ([place]) 03/27/2006  . Neuropathy   . Pneumonia    walking pneu 15 yrs. ago  . Skin lesion 03/15/2011   In gluteal crease now s/p removal by Dr. [name] of General Surgery on 3/12. Path shows condyloma.     . Sleep apnea 04-10-11   uses cpap, pt does not know settings  . Sleep apnea   . Trigger finger    left third    Family History  Problem Relation Age of Onset  . Pulmonary  embolism Mother   . Stroke Father   . Alcohol abuse Father   . Pneumonia Father   . Cancer Maternal Aunt        breast  . Breast cancer Maternal Aunt   . Asthma Other   . COPD Other   . Hypertension Other   . Diabetes Other   . Asthma Sister   . Depression Sister   . Arthritis Sister   . Sickle cell anemia Son     Past Surgical History:  Procedure Laterality Date  . ANAL FISTULECTOMY  04/17/2011   Procedure: FISTULECTOMY ANAL;  Surgeon: Imogene Burn. [name], MD;  Location: [facility];  Service: General;  Laterality: N/A;  Excision of Condyloma Gluteal Cleft   . BACK SURGERY  04-10-11   x5-Lumbar fusion-retained hardware.(Dr. Louanne Skye)  . BREAST CYST EXCISION     bilateral breast, 3 cysts removed from each breast  . BREAST EXCISIONAL BIOPSY Right    x 3  . BREAST EXCISIONAL BIOPSY Left    x 3  . CARPAL TUNNEL RELEASE Bilateral   . Cervical biospy    . COLONOSCOPY WITH PROPOFOL N/A 07/06/2015   Procedure: COLONOSCOPY WITH PROPOFOL;  Surgeon: [name], MD;  Location: [facility];  Service: Endoscopy;  Laterality: N/A;  . DOPPLER ECHOCARDIOGRAPHY  04/06/2012   AT [place] 55-60%  . FRACTURE SURGERY    . I&D EXTREMITY Left 12/18/2016   Procedure: IRRIGATION AND DEBRIDEMENT LEFT FOOT, CLOSURE;  Surgeon: [name], MD;  Location: [place];  Service: Orthopedics;  Laterality: Left;  [name] Kitchen MULTIPLE TOOTH EXTRACTIONS    . NECK SURGERY  04-10-11   x3- cervical fusion with plating and screws-Dr. [name]  . TEE WITHOUT CARDIOVERSION N/A 04/06/2012   Procedure: TRANSESOPHAGEAL ECHOCARDIOGRAM (TEE);  Surgeon: [name], MD;  Location: [facility] ENDOSCOPY;  Service: Cardiovascular;  Laterality: N/A;  . TRIGGER FINGER RELEASE Right    middle finger  . TRIGGER FINGER RELEASE Left 06/28/2016   Procedure: RELEASE TRIGGER FINGER LEFT 3RD FINGER;  Surgeon: [name], MD;  Location: [place];  Service: Orthopedics;  Laterality: Left;  . TRIGGER FINGER RELEASE Right 06/12/2016   Procedure: RIGHT INDEX  FINGER TRIGGER RELEASE;  Surgeon: [name], MD;  Location: [place];  Service: Orthopedics;  Laterality: Right;  . TRIGGER FINGER RELEASE Right 01/19/2018   Procedure: RIGHT RING FINGER TRIGGER FINGER RELEASE;  Surgeon: [name], MD;  Location: [place];  Service: Orthopedics;  Laterality: Right;   Social History   Occupational History  . Not on file  Tobacco Use  . Smoking status: Former Smoker    Packs/day: 1.00    Years: 40.00    Pack years: 40.00    Types: Cigarettes  Start date: 01/28/1977    Last attempt to quit: 09/07/2017    Years since quitting: 0.4  . Smokeless tobacco: Never Used  . Tobacco comment: Quit prior to hospitalization using low dose Chantix  Substance and Sexual Activity  . Alcohol use: No    Alcohol/week: 0.0 standard drinks  . Drug use: Not Currently    Types: Marijuana, "Crack" cocaine    Comment: hx marijuana usemany years ago, Crack -none since 11/2015  . Sexual activity: Not Currently    Partners: Male    Birth control/protection: Post-menopausal

## 2018-02-03 NOTE — Patient Instructions (Addendum)
Ms. Slawson , Thank you for taking time to come for your Medicare Wellness Visit. I appreciate your ongoing commitment to your health goals. Please review the following plan we discussed and let me know if I can assist you in the future.   These are the goals we discussed: Goals      Patient Stated   . Weight (lb) < 346 lb (156.9 kg) (pt-stated)     7% weight loss      Other   . Get back to swimming daily at [facility]  once breathing under control       This is a list of the screening recommended for you and due dates:  Health Maintenance  Topic Date Due  . Tetanus Vaccine  04/28/2018*  . Hemoglobin A1C  06/12/2018  . Eye exam for diabetics  11/15/2018  . Pap Smear  11/23/2018  . Complete foot exam   12/11/2018  . Mammogram  11/29/2019  . Colon Cancer Screening  07/05/2025  . Flu Shot  Completed  . Pneumococcal vaccine  Completed  .  Hepatitis C: One time screening is recommended by Center for Disease Control  (CDC) for  adults born from [birth year] through 1967.   Completed  . HIV Screening  Completed  *Topic was postponed. The date shown is not the original due date.     Fall Prevention in the Home, Adult Falls can cause injuries. They can happen to people of all ages. There are many things you can do to make your home safe and to help prevent falls. Ask for help when making these changes, if needed. What actions can I take to prevent falls? General Instructions  Use good lighting in all rooms. Replace any light bulbs that burn out.  Turn on the lights when you go into a dark area. Use night-lights.  Keep items that you use often in easy-to-reach places. Lower the shelves around your home if necessary.  Set up your furniture so you have a clear path. Avoid moving your furniture around.  Do not have throw rugs and other things on the floor that can make you trip.  Avoid walking on wet floors.  If any of your floors are uneven, fix them.  Add color or contrast paint or tape  to clearly mark and help you see: ? Any grab bars or handrails. ? First and last steps of stairways. ? Where the edge of each step is.  If you use a stepladder: ? Make sure that it is fully opened. Do not climb a closed stepladder. ? Make sure that both sides of the stepladder are locked into place. ? Ask someone to hold the stepladder for you while you use it.  If there are any pets around you, be aware of where they are. What can I do in the bathroom?      Keep the floor dry. Clean up any water that spills onto the floor as soon as it happens.  Remove soap buildup in the tub or shower regularly.  Use non-skid mats or decals on the floor of the tub or shower.  Attach bath mats securely with double-sided, non-slip rug tape.  If you need to sit down in the shower, use a plastic, non-slip stool.  Install grab bars by the toilet and in the tub and shower. Do not use towel bars as grab bars. What can I do in the bedroom?  Make sure that you have a light by your bed that is easy  to reach.  Do not use any sheets or blankets that are too big for your bed. They should not hang down onto the floor.  Have a firm chair that has side arms. You can use this for support while you get dressed. What can I do in the kitchen?  Clean up any spills right away.  If you need to reach something above you, use a strong step stool that has a grab bar.  Keep electrical cords out of the way.  Do not use floor polish or wax that makes floors slippery. If you must use wax, use non-skid floor wax. What can I do with my stairs?  Do not leave any items on the stairs.  Make sure that you have a light switch at the top of the stairs and the bottom of the stairs. If you do not have them, ask someone to add them for you.  Make sure that there are handrails on both sides of the stairs, and use them. Fix handrails that are broken or loose. Make sure that handrails are as long as the stairways.  Install  non-slip stair treads on all stairs in your home.  Avoid having throw rugs at the top or bottom of the stairs. If you do have throw rugs, attach them to the floor with carpet tape.  Choose a carpet that does not hide the edge of the steps on the stairway.  Check any carpeting to make sure that it is firmly attached to the stairs. Fix any carpet that is loose or worn. What can I do on the outside of my home?  Use bright outdoor lighting.  Regularly fix the edges of walkways and driveways and fix any cracks.  Remove anything that might make you trip as you walk through a door, such as a raised step or threshold.  Trim any bushes or trees on the path to your home.  Regularly check to see if handrails are loose or broken. Make sure that both sides of any steps have handrails.  Install guardrails along the edges of any raised decks and porches.  Clear walking paths of anything that might make someone trip, such as tools or rocks.  Have any leaves, snow, or ice cleared regularly.  Use sand or salt on walking paths during winter.  Clean up any spills in your garage right away. This includes grease or oil spills. What other actions can I take?  Wear shoes that: ? Have a low heel. Do not wear high heels. ? Have rubber bottoms. ? Are comfortable and fit you well. ? Are closed at the toe. Do not wear open-toe sandals.  Use tools that help you move around (mobility aids) if they are needed. These include: ? Canes. ? Walkers. ? Scooters. ? Crutches.  Review your medicines with your doctor. Some medicines can make you feel dizzy. This can increase your chance of falling. Ask your doctor what other things you can do to help prevent falls. Where to find more information  Centers for Disease Control and Prevention, STEADI: https://garcia.biz/  [employer] on Aging: BrainJudge.co.uk Contact a doctor if:  You are afraid of falling at home.  You feel weak, drowsy, or  dizzy at home.  You fall at home. Summary  There are many simple things that you can do to make your home safe and to help prevent falls.  Ways to make your home safe include removing tripping hazards and installing grab bars in the bathroom.  Ask  for help when making these changes in your home. This information is not intended to replace advice given to you by your health care provider. Make sure you discuss any questions you have with your health care provider. Document Released: 11/10/2008 Document Revised: 08/29/2016 Document Reviewed: 08/29/2016 Elsevier Interactive Patient Education  2019 [place] Heart-healthy meal planning includes: Eating less unhealthy fats. Eating more healthy fats. Making other changes in your diet. Talk with your doctor or a diet specialist (dietitian) to create an eating plan that is right for you. What is my plan? Your doctor may recommend an eating plan that includes: Total fat: ______% or less of total calories a day. Saturated fat: ______% or less of total calories a day. Cholesterol: less than _________mg a day. What are tips for following this plan? Cooking Avoid frying your food. Try to bake, boil, grill, or broil it instead. You can also reduce fat by: Removing the skin from poultry. Removing all visible fats from meats. Steaming vegetables in water or broth. Meal planning  At meals, divide your plate into four equal parts: Fill one-half of your plate with vegetables and green salads. Fill one-fourth of your plate with whole grains. Fill one-fourth of your plate with lean protein foods. Eat 4-5 servings of vegetables per day. A serving of vegetables is: 1 cup of raw or cooked vegetables. 2 cups of raw leafy greens. Eat 4-5 servings of fruit per day. A serving of fruit is: 1 medium whole fruit.  cup of dried fruit.  cup of fresh, frozen, or canned fruit.  cup of 100% fruit juice. Eat more foods that  have soluble fiber. These are apples, broccoli, carrots, beans, peas, and barley. Try to get 20-30 g of fiber per day. Eat 4-5 servings of nuts, legumes, and seeds per week: 1 serving of dried beans or legumes equals  cup after being cooked. 1 serving of nuts is  cup. 1 serving of seeds equals 1 tablespoon. General information Eat more home-cooked food. Eat less restaurant, buffet, and fast food. Limit or avoid alcohol. Limit foods that are high in starch and sugar. Avoid fried foods. Lose weight if you are overweight. Keep track of how much salt (sodium) you eat. This is important if you have high blood pressure. Ask your doctor to tell you more about this. Try to add vegetarian meals each week. Fats Choose healthy fats. These include olive oil and canola oil, flaxseeds, walnuts, almonds, and seeds. Eat more omega-3 fats. These include salmon, mackerel, sardines, tuna, flaxseed oil, and ground flaxseeds. Try to eat fish at least 2 times each week. Check food labels. Avoid foods with trans fats or high amounts of saturated fat. Limit saturated fats. These are often found in animal products, such as meats, butter, and cream. These are also found in plant foods, such as palm oil, palm kernel oil, and coconut oil. Avoid foods with partially hydrogenated oils in them. These have trans fats. Examples are stick margarine, some tub margarines, cookies, crackers, and other baked goods. What foods can I eat? Fruits All fresh, canned (in natural juice), or frozen fruits. Vegetables Fresh or frozen vegetables (raw, steamed, roasted, or grilled). Green salads. Grains Most grains. Choose whole wheat and whole grains most of the time. Rice and pasta, including brown rice and pastas made with whole wheat. Meats and other proteins Lean, well-trimmed beef, veal, pork, and lamb. Chicken and [place] without skin. All fish and shellfish. Wild duck, rabbit, pheasant, and  venison. Egg whites or  low-cholesterol egg substitutes. Dried beans, peas, lentils, and tofu. Seeds and most nuts. Dairy Low-fat or nonfat cheeses, including ricotta and mozzarella. Skim or 1% milk that is liquid, powdered, or evaporated. Buttermilk that is made with low-fat milk. Nonfat or low-fat yogurt. Fats and oils Non-hydrogenated (trans-free) margarines. Vegetable oils, including soybean, sesame, sunflower, olive, peanut, safflower, corn, canola, and cottonseed. Salad dressings or mayonnaise made with a vegetable oil. Beverages Mineral water. Coffee and tea. Diet carbonated beverages. Sweets and desserts Sherbet, gelatin, and fruit ice. Small amounts of dark chocolate. Limit all sweets and desserts. Seasonings and condiments All seasonings and condiments. The items listed above may not be a complete list of foods and drinks you can eat. Contact a dietitian for more options. What foods should I avoid? Fruits Canned fruit in heavy syrup. Fruit in cream or butter sauce. Fried fruit. Limit coconut. Vegetables Vegetables cooked in cheese, cream, or butter sauce. Fried vegetables. Grains Breads that are made with saturated or trans fats, oils, or whole milk. Croissants. Sweet rolls. Donuts. High-fat crackers, such as cheese crackers. Meats and other proteins Fatty meats, such as hot dogs, ribs, sausage, bacon, rib-eye roast or steak. High-fat deli meats, such as salami and bologna. Caviar. Domestic duck and goose. Organ meats, such as liver. Dairy Cream, sour cream, cream cheese, and creamed cottage cheese. Whole-milk cheeses. Whole or 2% milk that is liquid, evaporated, or condensed. Whole buttermilk. Cream sauce or high-fat cheese sauce. Yogurt that is made from whole milk. Fats and oils Meat fat, or shortening. Cocoa butter, hydrogenated oils, palm oil, coconut oil, palm kernel oil. Solid fats and shortenings, including bacon fat, salt pork, lard, and butter. Nondairy cream substitutes. Salad dressings with  cheese or sour cream. Beverages Regular sodas and juice drinks with added sugar. Sweets and desserts Frosting. Pudding. Cookies. Cakes. Pies. Milk chocolate or white chocolate. Buttered syrups. Full-fat ice cream or ice cream drinks. The items listed above may not be a complete list of foods and drinks to avoid. Contact a dietitian for more information. Summary Heart-healthy meal planning includes eating less unhealthy fats, eating more healthy fats, and making other changes in your diet. Eat a balanced diet. This includes fruits and vegetables, low-fat or nonfat dairy, lean protein, nuts and legumes, whole grains, and heart-healthy oils and fats. This information is not intended to replace advice given to you by your health care provider. Make sure you discuss any questions you have with your health care provider. Document Released: 07/16/2011 Document Revised: 02/21/2017 Document Reviewed: 02/21/2017 Elsevier Interactive Patient Education  2019 [employer].

## 2018-02-03 NOTE — Progress Notes (Signed)
Subjective:   [name] is a 53 y.o. female who presents for Medicare Annual (Subsequent) preventive examination.  Review of Systems:  Physical assessment deferred to PCP.  Cardiac Risk Factors include: obesity (BMI >30kg/m2);diabetes mellitus;hypertension;sedentary lifestyle     Objective:    Vitals: BP 130/80 Comment: large cuff  Pulse 70   Temp 97.8 F (36.6 C) (Oral)   Ht '5\' 9"'  (1.753 m)   Wt (!) 415 lb 12.8 oz (188.6 kg)   LMP 04/10/2006   SpO2 95% Comment: on O2 at 2L per [place]  BMI 61.40 kg/m   Body mass index is 61.4 kg/m.  Advanced Directives 02/03/2018 01/30/2018 01/27/2018 01/15/2018 01/15/2018 11/03/2017 10/07/2017  Does Patient Have a Medical Advance Directive? No No No No No No No  Would patient like information on creating a medical advance directive? No - Patient declined No - Patient declined - Yes (MAU/Ambulatory/Procedural Areas - Information given) Yes (MAU/Ambulatory/Procedural Areas - Information given) No - Patient declined No - Patient declined  Pre-existing out of facility DNR order (yellow form or pink MOST form) - - - - - - -    Tobacco Social History   Tobacco Use  Smoking Status Former Smoker  . Packs/day: 1.00  . Years: 40.00  . Pack years: 40.00  . Types: Cigarettes  . Start date: 01/28/1977  . Last attempt to quit: 09/07/2017  . Years since quitting: 0.4  Smokeless Tobacco Never Used  Tobacco Comment   Quit prior to hospitalization using low dose Chantix     Counseling given: Yes Comment: Quit prior to hospitalization using low dose Chantix   Clinical Intake:  Pre-visit preparation completed: Yes  Pain : 0-10 Pain Score: 9  Pain Location: Neck Pain Radiating Towards: shoulders, lower back, arms, hands Pain Onset: More than a month ago Pain Frequency: Constant     Nutritional Status: BMI > 30  Obese Diabetes: Yes CBG done?: No Did pt. bring in CBG monitor from home?: No  How often do you need to have someone help you  when you read instructions, pamphlets, or other written materials from your doctor or pharmacy?: 1 - Never What is the last grade level you completed in school?: 12th grade  Interpreter Needed?: No     Past Medical History:  Diagnosis Date  . Anxiety   . Arthritis 04-10-11   hips, shoulders, back  . Asthma   . Bipolar disorder ([place])   . Cancer ([place]) 1993   cervical, no treatment done, went away per pt  . Cervical dysplasia or atypia 04-10-11   '93- once dx.-got pregnant-no intervention, then postpartum, no dysplasia found  . CHF (congestive heart failure) ([place])    no cardiologist 2014 dx, none now  . Chronic kidney disease    stage 3 kidney disease  . 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 Pathology showed Condyloma, gluteal CONDYLOMA ACUMINATUM.   [name] Kitchen COPD (chronic obstructive pulmonary disease) ([place])   . Diabetes mellitus without complication ([place])   . Dyspnea    with actity, sitting  . Fall 12/16/2016  . Fibrocystic breast disease   . GERD (gastroesophageal reflux disease)   . Hepatitis    hep B-count is low at present,doesn't register  . Hypercalcemia   . Hyperlipidemia   . Hypertension   . Lung nodule 11/2017   right and left lung  . Migraine headache    none recent  . Morbid obesity ([place]) 03/27/2006  . Neuropathy   .  Pneumonia    walking pneu 15 yrs. ago  . Skin lesion 03/15/2011   In gluteal crease now s/p removal by Dr. [name] of General Surgery on 3/12. Path shows condyloma.     . Sleep apnea 04-10-11   uses cpap, pt does not know settings  . Sleep apnea   . Trigger finger    left third   Past Surgical History:  Procedure Laterality Date  . ANAL FISTULECTOMY  04/17/2011   Procedure: FISTULECTOMY ANAL;  Surgeon: Imogene Burn. [name], MD;  Location: [facility];  Service: General;  Laterality: N/A;  Excision of Condyloma Gluteal Cleft   . BACK SURGERY  04-10-11   x5-Lumbar fusion-retained hardware.(Dr. Louanne Skye)  . BREAST CYST EXCISION      bilateral breast, 3 cysts removed from each breast  . BREAST EXCISIONAL BIOPSY Right    x 3  . BREAST EXCISIONAL BIOPSY Left    x 3  . CARPAL TUNNEL RELEASE Bilateral   . Cervical biospy    . COLONOSCOPY WITH PROPOFOL N/A 07/06/2015   Procedure: COLONOSCOPY WITH PROPOFOL;  Surgeon: [name], MD;  Location: [facility];  Service: Endoscopy;  Laterality: N/A;  . DOPPLER ECHOCARDIOGRAPHY  04/06/2012   AT [place] 55-60%  . FRACTURE SURGERY    . I&D EXTREMITY Left 12/18/2016   Procedure: IRRIGATION AND DEBRIDEMENT LEFT FOOT, CLOSURE;  Surgeon: [name], MD;  Location: [facility];  Service: Orthopedics;  Laterality: Left;  [name] Kitchen MULTIPLE TOOTH EXTRACTIONS    . NECK SURGERY  04-10-11   x3- cervical fusion with plating and screws-Dr. [name]  . TEE WITHOUT CARDIOVERSION N/A 04/06/2012   Procedure: TRANSESOPHAGEAL ECHOCARDIOGRAM (TEE);  Surgeon: [name], MD;  Location[facility] ENDOSCOPY;  Service: Cardiovascular;  Laterality: N/A;  . TRIGGER FINGER RELEASE Right    middle finger  . TRIGGER FINGER RELEASE Left 06/28/2016   Procedure: RELEASE TRIGGER FINGER LEFT 3RD FINGER;  Surgeon: [name], MD;  Location: [place];  Service: Orthopedics;  Laterality: Left;  . TRIGGER FINGER RELEASE Right 06/12/2016   Procedure: RIGHT INDEX FINGER TRIGGER RELEASE;  Surgeon: [name], MD;  Location: [place];  Service: Orthopedics;  Laterality: Right;  . TRIGGER FINGER RELEASE Right 01/19/2018   Procedure: RIGHT RING FINGER TRIGGER FINGER RELEASE;  Surgeon: [name], MD;  Location: [place];  Service: Orthopedics;  Laterality: Right;   Family History  Problem Relation Age of Onset  . Pulmonary embolism Mother   . Stroke Father   . Alcohol abuse Father   . Pneumonia Father   . Cancer Maternal Aunt        breast  . Breast cancer Maternal Aunt   . Asthma Other   . COPD Other   . Hypertension Other   . Diabetes Other   . Asthma Sister   . Depression Sister   . Arthritis Sister   . Sickle cell  anemia Son    Social History   Socioeconomic History  . Marital status: Divorced    Spouse name: Not on file  . Number of children: 1  . Years of education: 69  . Highest education level: 12th grade  Occupational History  . Not on file  Social Needs  . Financial resource strain: Not hard at all  . Food insecurity:    Worry: Never true    Inability: Never true  . Transportation needs:    Medical: No    Non-medical: No  Tobacco Use  . Smoking status:  Former Smoker    Packs/day: 1.00    Years: 40.00    Pack years: 40.00    Types: Cigarettes    Start date: 01/28/1977    Last attempt to quit: 09/07/2017    Years since quitting: 0.4  . Smokeless tobacco: Never Used  . Tobacco comment: Quit prior to hospitalization using low dose Chantix  Substance and Sexual Activity  . Alcohol use: No    Alcohol/week: 0.0 standard drinks  . Drug use: Not Currently    Types: Marijuana, "Crack" cocaine    Comment: hx marijuana usemany years ago, Crack -none since 11/2015  . Sexual activity: Not Currently    Partners: Male    Birth control/protection: Post-menopausal  Lifestyle  . Physical activity:    Days per week: 0 days    Minutes per session: 0 min  . Stress: Not at all  Relationships  . Social connections:    Talks on phone: More than three times a week    Gets together: Twice a week    Attends religious service: 1 to 4 times per year    Active member of club or organization: No    Attends meetings of clubs or organizations: Never    Relationship status: Divorced  Other Topics Concern  . Not on file  Social History Narrative   Current Social History       Who lives at home: Patient lives alone in one level home; has 4 steps onto porch with a handrail. Has smoke alarms, no throw rugs. Has elevated toilet. Has shower bench in tub with grab rails.    Transportation: Patient has own vehicle and drives herself    Important Relationships "Family and friends"    Pets: None    Likes  to eat varied diet. Seafood, fish, roasted [place] breast, vegetables and salads and fruits.   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 I'm a lovable person."                                                                                                    Outpatient Encounter Medications as of 02/03/2018  Medication Sig  . ACCU-CHEK AVIVA PLUS test strip TEST 4 TIMES DAILY  . ACCU-CHEK SOFTCLIX LANCETS lancets TEST 4 TIMES DAILY  . albuterol (PROAIR HFA) 108 (90 Base) MCG/ACT inhaler Inhale 2 puffs into the lungs every 6 (six) hours as needed for shortness of breath.  . allopurinol (ZYLOPRIM) 100 MG tablet Take 100 mg by mouth daily.  [name] Kitchen ammonium lactate (AMLACTIN) 12 % lotion Apply 1 application topically 2 (two) times daily as needed for dry skin.  [name] Kitchen atorvastatin (LIPITOR) 40 MG tablet Take 1 tablet (40 mg total) by mouth daily.  . baclofen (LIORESAL) 10 MG  tablet Take 2 tablets (20 mg total) by mouth 3 (three) times daily.  . blood glucose meter kit and supplies KIT Dispense based on patient and insurance preference. Use up to four times daily as directed. (FOR ICD-9 250.00, 250.01).  . busPIRone (BUSPAR) 10 MG tablet Take 1 tablet (10 mg total) by mouth 3 (three) times daily.  . Capsaicin-Menthol (PAIN RELIEF EX) Apply 1 application topically 3 (three) times daily as needed (shoulder pain).  . citalopram (CELEXA) 40 MG tablet TAKE 1 TABLET BY MOUTH EVERY DAY (Patient taking differently: Take 40 mg by mouth every morning. )  . diclofenac sodium (VOLTAREN) 1 % GEL APPLY 2 GRAMS TO AFFECTED AREA 4 TIMES A DAY (Patient taking differently: Apply 2 g topically 4 (four) times daily. )  . diltiazem (CARDIZEM CD) 120 MG 24 hr capsule Take 1 capsule  (120 mg total) by mouth 2 (two) times daily.  [name] Kitchen docusate sodium (COLACE) 100 MG capsule Take 200 mg by mouth 2 (two) times daily.  . fluticasone (FLONASE) 50 MCG/ACT nasal spray Place 2 sprays into both nostrils 2 (two) times daily.  . furosemide (LASIX) 40 MG tablet Take 2 tablets (80 mg total) by mouth 2 (two) times daily.  [name] Kitchen gabapentin (NEURONTIN) 300 MG capsule TAKE 2 CAPSULES (600 MG TOTAL) BY MOUTH 3 (THREE) TIMES DAILY.  [name] Kitchen HYDROcodone-acetaminophen (NORCO) 7.5-325 MG tablet Take 1 tablet by mouth every 6 (six) hours as needed for moderate pain.  . hydrOXYzine (VISTARIL) 100 MG capsule Take 1 capsule (100 mg total) by mouth 3 (three) times daily as needed for itching.  . Insulin Glargine (LANTUS SOLOSTAR) 100 UNIT/ML Solostar Pen Inject 10 Units into the skin daily.  . insulin lispro (HUMALOG KWIKPEN) 100 UNIT/ML KiwkPen Inject 0.03 mLs (3 Units total) into the skin 3 (three) times daily before meals.  . Insulin Pen Needle (B-D UF III MINI PEN NEEDLES) 31G X 5 MM MISC CHECK SUGARS 4 TIMES A DAY BEFORE MEALS AND AT BEDTIME.  [name] Kitchen JARDIANCE 25 MG TABS tablet TAKE 1 TABLET BY MOUTH EVERY DAY (Patient taking differently: Take 25 mg by mouth every morning. Give in the morning with or without food.)  . ketoconazole (NIZORAL) 2 % cream Apply 1 fingertip amount to each foot daily.  [name] Kitchen loratadine (CLARITIN) 10 MG tablet Take 1 tablet (10 mg total) by mouth daily.  [name] Kitchen losartan (COZAAR) 100 MG tablet Take 1 tablet (100 mg total) by mouth daily.  . metolazone (ZAROXOLYN) 2.5 MG tablet Take 1 tablet ONLY on 01/28/2018, then no more until instructed.  . montelukast (SINGULAIR) 10 MG tablet Take 1 tablet (10 mg total) by mouth at bedtime. AS DIRECTED  . Multiple Vitamins-Minerals (MULTIVITAMIN WITH MINERALS) tablet Take 1 tablet by mouth daily.  . mupirocin ointment (BACTROBAN) 2 % Apply 1 application topically 2 (two) times daily.  . NON FORMULARY Uses a C-PAP at bedtime  . nystatin (MYCOSTATIN) 100000 UNIT/ML  suspension Take 5 mLs (500,000 Units total) by mouth 4 (four) times daily. Swish and swallow. (Patient taking differently: Take 5 mLs by mouth daily as needed (mouth sores). Swish and swallow.)  . omeprazole (PRILOSEC) 20 MG capsule Take 1 capsule (20 mg total) by mouth daily as needed. (Patient taking differently: Take 20 mg by mouth daily as needed (reflux/heartburn). )  . oxybutynin (DITROPAN XL) 10 MG 24 hr tablet Take 1 tablet (10 mg total) by mouth at bedtime.  . OXYGEN Inhale 2 L into the lungs continuous.  . promethazine (PHENERGAN) 25  MG tablet Take 1 tablet (25 mg total) by mouth every 6 (six) hours as needed for nausea.  . TRELEGY ELLIPTA 100-62.5-25 MCG/INH AEPB INHALE 1 PUFF INTO THE LUNGS DAILY (Patient taking differently: Inhale 1 puff into the lungs daily. )  . Vitamin D, Ergocalciferol, (DRISDOL) 50000 units CAPS capsule Take 50,000 Units by mouth every 30 (thirty) days.   . [DISCONTINUED] sucralfate (CARAFATE) 1 G tablet Take 1 tablet (1 g total) by mouth daily.   Facility-Administered Encounter Medications as of 02/03/2018  Medication  . technetium tetrofosmin (TC-MYOVIEW) injection 25.8 millicurie    Activities of Daily Living In your present state of health, do you have any difficulty performing the following activities: 02/03/2018 01/15/2018  Hearing? N N  Vision? N N  Difficulty concentrating or making decisions? N N  Walking or climbing stairs? Y Y  Comment breathing problems -  Dressing or bathing? Y N  Comment Has HH aide to help with bathing daily -  Doing errands, shopping? N -  Comment wheelchair use for long distance -  Conservation officer, nature and eating ? N -  Using the Toilet? N -  In the past six months, have you accidently leaked urine? N -  Do you have problems with loss of bowel control? N -  Managing your Medications? N -  Managing your Finances? N -  Housekeeping or managing your Housekeeping? N -  Some recent data might be hidden    Patient Care  Team: [name], MD as PCP - General Rutherford Guys, MD as Consulting Physician (Ophthalmology) Renelda Mom, DMD (Dentistry) [name], MD as Consulting Physician (Cardiology) [name], MD as Attending Physician (Orthopedic Surgery) Gardiner Barefoot, DPM as Consulting Physician (Podiatry) Lovena Neighbours Conception Oms, MD as Consulting Physician (Urology) Jessy Oto, MD as Consulting Physician (Orthopedic Surgery) Star Age, MD as Attending Physician (Neurology) Rosita Fire, MD as Consulting Physician (Nephrology) Garner Nash, DO as Consulting Physician (Pulmonary Disease)    Assessment:   This is a routine wellness examination for [place].  Exercise Activities and Dietary recommendations Current Exercise Habits: The patient does not participate in regular exercise at present, Exercise limited by: orthopedic condition(s);respiratory conditions(s)  Goals      Patient Stated   . Weight (lb) < 346 lb (156.9 kg) (pt-stated)     7% weight loss      Other   . Get back to swimming daily at [facility]  once breathing under control       Fall Risk Fall Risk  02/03/2018 01/30/2018 01/15/2018 12/01/2017 11/03/2017  Falls in the past year? '1 1 1 ' 0 Yes  Number falls in past yr: 1 1 0 0 2 or more  Injury with Fall? '1 1 1 ' - Yes  Comment - - - - -  Risk Factor Category  - - - - -  Comment - - - - -  Risk for fall due to : History of fall(s);Impaired mobility - - - -  Follow up Falls prevention discussed - - - Falls prevention discussed   Is the patient's home free of loose throw rugs in walkways, pet beds, electrical cords, etc?   yes      Grab bars in the bathroom? yes      Handrails on the stairs?   yes      Adequate lighting?   yes   Depression Screen PHQ 2/9 Scores 02/03/2018 01/30/2018 01/15/2018 12/12/2017  PHQ - 2 Score 0 0 0 0  PHQ- 9  Score - - - -     Cognitive Function MMSE - Mini Mental State Exam 02/03/2018  Orientation to time 5  Orientation  to Place 5  Registration 3  Attention/ Calculation 5  Recall 3  Language- name 2 objects 2  Language- repeat 1  Language- follow 3 step command 3  Language- read & follow direction 1  Write a sentence 1  Copy design 1  Total score 30     6CIT Screen 02/03/2018  What Year? 0 points  What month? 0 points  What time? 0 points  Count back from 20 0 points  Months in reverse 0 points  Repeat phrase 0 points  Total Score 0    Immunization History  Administered Date(s) Administered  . Influenza Split 04/05/2012  . Influenza Whole 02/24/2007, 10/23/2007, 12/06/2008, 01/16/2010  . Influenza,inj,Quad PF,6+ Mos 11/29/2013, 09/29/2014, 11/23/2015, 11/22/2016, 10/07/2017  . Pneumococcal Conjugate-13 12/27/2016  . Pneumococcal Polysaccharide-23 11/28/2001  . Td 05/28/2004    Screening Tests Health Maintenance  Topic Date Due  . TETANUS/TDAP  04/28/2018 (Originally 05/29/2014)  . HEMOGLOBIN A1C  06/12/2018  . OPHTHALMOLOGY EXAM  11/15/2018  . PAP SMEAR-Modifier  11/23/2018  . FOOT EXAM  12/11/2018  . MAMMOGRAM  11/29/2019  . COLONOSCOPY  07/05/2025  . INFLUENZA VACCINE  Completed  . PNEUMOCOCCAL POLYSACCHARIDE VACCINE AGE 83-64 HIGH RISK  Completed  . Hepatitis C Screening  Completed  . HIV Screening  Completed    Cancer Screenings: Lung: Low Dose CT Chest recommended if Age 71-80 years, 30 pack-year currently smoking OR have quit w/in 15years. Patient does qualify. Breast:  Up to date on Mammogram? Yes   Up to date of Bone Density/Dexa? No Colorectal: up to date  Additional Screenings:  Hepatitis C Screening: complete     Plan:  All health maintenance items up to date. Patient needs refills on two medications which will be sent under a separate encounter.   I have personally reviewed and noted the following in the patient's chart:   . Medical and social history . Use of alcohol, tobacco or illicit drugs  . Current medications and supplements . Functional ability and  status . Nutritional status . Physical activity . Advanced directives . List of other physicians . Hospitalizations, surgeries, and ER visits in previous 12 months . Vitals . Screenings to include cognitive, depression, and falls . Referrals and appointments  In addition, I have reviewed and discussed with patient certain preventive protocols, quality metrics, and best practice recommendations. A written personalized care plan for preventive services as well as general preventive health recommendations were provided to patient.     Esau Grew, RN  02/03/2018

## 2018-02-04 ENCOUNTER — Encounter: Payer: Self-pay | Admitting: Physician Assistant

## 2018-02-04 ENCOUNTER — Ambulatory Visit (INDEPENDENT_AMBULATORY_CARE_PROVIDER_SITE_OTHER): Payer: Medicare Other | Admitting: Physician Assistant

## 2018-02-04 VITALS — BP 132/80 | HR 48 | Ht 70.0 in | Wt >= 6400 oz

## 2018-02-04 DIAGNOSIS — E119 Type 2 diabetes mellitus without complications: Secondary | ICD-10-CM

## 2018-02-04 DIAGNOSIS — Z9989 Dependence on other enabling machines and devices: Secondary | ICD-10-CM

## 2018-02-04 DIAGNOSIS — R0789 Other chest pain: Secondary | ICD-10-CM | POA: Diagnosis not present

## 2018-02-04 DIAGNOSIS — I5033 Acute on chronic diastolic (congestive) heart failure: Secondary | ICD-10-CM

## 2018-02-04 DIAGNOSIS — R600 Localized edema: Secondary | ICD-10-CM | POA: Diagnosis not present

## 2018-02-04 DIAGNOSIS — E8770 Fluid overload, unspecified: Secondary | ICD-10-CM | POA: Diagnosis not present

## 2018-02-04 DIAGNOSIS — I10 Essential (primary) hypertension: Secondary | ICD-10-CM | POA: Diagnosis not present

## 2018-02-04 DIAGNOSIS — I471 Supraventricular tachycardia, unspecified: Secondary | ICD-10-CM

## 2018-02-04 DIAGNOSIS — G4733 Obstructive sleep apnea (adult) (pediatric): Secondary | ICD-10-CM

## 2018-02-04 DIAGNOSIS — Z794 Long term (current) use of insulin: Secondary | ICD-10-CM

## 2018-02-04 MED ORDER — POTASSIUM CHLORIDE CRYS ER 20 MEQ PO TBCR: EXTENDED_RELEASE_TABLET | ORAL | 0 refills | Status: DC

## 2018-02-04 MED ORDER — METOLAZONE 2.5 MG PO TABS: ORAL_TABLET | ORAL | 0 refills | Status: DC

## 2018-02-04 MED ORDER — OMEPRAZOLE 20 MG PO CPDR: 20.0000 mg | DELAYED_RELEASE_CAPSULE | Freq: Every day | ORAL | 3 refills | Status: DC | PRN

## 2018-02-04 NOTE — Patient Instructions (Addendum)
Medication Instructions:  INCREASE Metolazone to 2.5mg  Take 1 tablet twice weekly on Monday and Thursday  START Potassium 47meq Take 1 tablet twice weekly Monday and Thursday   If you need a refill on your cardiac medications before your next appointment, please call your pharmacy.   Lab work: Your physician recommends that you return for lab work in: [place] If you have labs (blood work) drawn today and your tests are completely normal, you will receive your results only by: [name] Kitchen MyChart Message (if you have MyChart) OR . A paper copy in the mail If you have any lab test that is abnormal or we need to change your treatment, we will call you to review the results.  Testing/Procedures: None   Follow-Up: At [facility], you and your health needs are our priority.  As part of our continuing mission to provide you with exceptional heart care, we have created designated Provider Care Teams.  These Care Teams include your primary Cardiologist (physician) and Advanced Practice Providers (APPs -  Physician Assistants and Nurse Practitioners) who all work together to provide you with the care you need, when you need it.  . Your physician recommends that you schedule a follow-up appointment in: 3-4 WEEKS WITH HAO MENG, PA-C  Any Other Special Instructions Will Be Listed Below (If Applicable).

## 2018-02-04 NOTE — Progress Notes (Signed)
Cardiology Office Note    Date:  06/24/7822   ID:  CAYDEN RAUTIO, DOB [birth year], MRN [id number]  PCP:  [name], MD  Cardiologist:  Dr. Harrell Gave  Chief Complaint  Patient presents with  . Follow-up    seen for Dr. Harrell Gave    History of Present Illness:  [name] is a 53 y.o. female with PMH of morbid obesity, type II DM, chronic diastolic heart failure, hypertension and history of SVT.  Patient was initially admitted in early September for acute on chronic diastolic heart failure.  She was discharged on 40 mg twice daily of Lasix and 2 L home O2 with ambulation.  She also has sleep apnea and use CPAP at home.  Myoview obtained on 11/11/2017 was low risk with normal EF, possible apical septal infarct versus artifact, no ischemia.  She had has a history of tobacco abuse but quit in August 2019.  Patient was last seen by Dr. Harrell Gave on 01/08/2018, diltiazem was increased to 120 mg twice daily.  She underwent trigger finger release on 01/19/2018.  After the surgery, her weight increased from 404 pounds up to 417 pounds.  This eventually increased to 424 pounds on 01/22/2018.  Some of the increased volume overloaded likely also related to dietary indiscretion during the holiday season as well.  She contacted our office on 12/26 and was instructed to double up her Lasix to 80 mg twice daily for 3 days before returning for outpatient visit.  She slid off her bed and fell forward onto the floor on 01/25/2018.  During the ED visit, due to complaint of shortness of breath, she was given 60 mg IV Lasix.  Patient returned back to the emergency room around 4 AM this morning with chest tightness and shortness of breath.  Patient was given Lasix IV 80 mg x 1.  Her weight on arrival to the ED was 422 pounds.   I last saw the patient on 01/27/2018, her weight was still 10 pounds over compared to her dry weight.  We discussed various options, I gave her a single dose of metolazone to take  in the morning along with her normal diuretic.  She presents today for follow-up.  Her weight is essentially the same as last week.  She says she noticed increased urinary output after a single dose of metolazone, and she did lose some weight however her weight increased back up to the previous range.  I will change her to twice weekly dosing of metolazone with addition of twice weekly dosing of potassium supplement 20 mEq every Monday and Thursday.  She will need basic metabolic panel today and in 2 weeks before follow-up in 3 to 4 weeks.  Otherwise she will need EKG prior to discharge given her heart rate of 48.    Past Medical History:  Diagnosis Date  . Anxiety   . Arthritis 04-10-11   hips, shoulders, back  . Asthma   . Bipolar disorder ([place])   . Cancer ([place]) 1993   cervical, no treatment done, went away per pt  . Cervical dysplasia or atypia 04-10-11   '93- once dx.-got pregnant-no intervention, then postpartum, no dysplasia found  . CHF (congestive heart failure) ([place])    no cardiologist 2014 dx, none now  . Chronic kidney disease    stage 3 kidney disease  . 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 Pathology showed Condyloma, gluteal CONDYLOMA ACUMINATUM.   [name] Kitchen COPD (chronic obstructive pulmonary  disease) ([place])   . Diabetes mellitus without complication ([place])   . Dyspnea    with actity, sitting  . Fall 12/16/2016  . Fibrocystic breast disease   . GERD (gastroesophageal reflux disease)   . Hepatitis    hep B-count is low at present,doesn't register  . Hypercalcemia   . Hyperlipidemia   . Hypertension   . Lung nodule 11/2017   right and left lung  . Migraine headache    none recent  . Morbid obesity ([place]) 03/27/2006  . Neuropathy   . Pneumonia    walking pneu 15 yrs. ago  . Skin lesion 03/15/2011   In gluteal crease now s/p removal by Dr. [name] of General Surgery on 3/12. Path shows condyloma.     . Sleep apnea 04-10-11   uses cpap, pt does not know  settings  . Sleep apnea   . Trigger finger    left third    Past Surgical History:  Procedure Laterality Date  . ANAL FISTULECTOMY  04/17/2011   Procedure: FISTULECTOMY ANAL;  Surgeon: Imogene Burn. [name], MD;  Location: [facility];  Service: General;  Laterality: N/A;  Excision of Condyloma Gluteal Cleft   . BACK SURGERY  04-10-11   x5-Lumbar fusion-retained hardware.(Dr. Louanne Skye)  . BREAST CYST EXCISION     bilateral breast, 3 cysts removed from each breast  . BREAST EXCISIONAL BIOPSY Right    x 3  . BREAST EXCISIONAL BIOPSY Left    x 3  . CARPAL TUNNEL RELEASE Bilateral   . Cervical biospy    . COLONOSCOPY WITH PROPOFOL N/A 07/06/2015   Procedure: COLONOSCOPY WITH PROPOFOL;  Surgeon: [name], MD;  Location: [facility];  Service: Endoscopy;  Laterality: N/A;  . DOPPLER ECHOCARDIOGRAPHY  04/06/2012   AT [place] 55-60%  . FRACTURE SURGERY    . I&D EXTREMITY Left 12/18/2016   Procedure: IRRIGATION AND DEBRIDEMENT LEFT FOOT, CLOSURE;  Surgeon: [name], MD;  Location: [place];  Service: Orthopedics;  Laterality: Left;  [name] Kitchen MULTIPLE TOOTH EXTRACTIONS    . NECK SURGERY  04-10-11   x3- cervical fusion with plating and screws-Dr. [name]  . TEE WITHOUT CARDIOVERSION N/A 04/06/2012   Procedure: TRANSESOPHAGEAL ECHOCARDIOGRAM (TEE);  Surgeon: [name], MD;  Location[facility] ENDOSCOPY;  Service: Cardiovascular;  Laterality: N/A;  . TRIGGER FINGER RELEASE Right    middle finger  . TRIGGER FINGER RELEASE Left 06/28/2016   Procedure: RELEASE TRIGGER FINGER LEFT 3RD FINGER;  Surgeon: [name], MD;  Location: [place];  Service: Orthopedics;  Laterality: Left;  . TRIGGER FINGER RELEASE Right 06/12/2016   Procedure: RIGHT INDEX FINGER TRIGGER RELEASE;  Surgeon: [name], MD;  Location: [place];  Service: Orthopedics;  Laterality: Right;  . TRIGGER FINGER RELEASE Right 01/19/2018   Procedure: RIGHT RING FINGER TRIGGER FINGER RELEASE;  Surgeon: [name], MD;  Location: [place];   Service: Orthopedics;  Laterality: Right;    Current Medications: Outpatient Medications Prior to Visit  Medication Sig Dispense Refill  . ACCU-CHEK AVIVA PLUS test strip TEST 4 TIMES DAILY 100 each 12  . ACCU-CHEK SOFTCLIX LANCETS lancets TEST 4 TIMES DAILY 200 each 6  . albuterol (PROAIR HFA) 108 (90 Base) MCG/ACT inhaler Inhale 2 puffs into the lungs every 6 (six) hours as needed for shortness of breath. 1 Inhaler 1  . allopurinol (ZYLOPRIM) 100 MG tablet Take 100 mg by mouth daily.    [name] Kitchen ammonium lactate (AMLACTIN) 12 % lotion Apply 1  application topically 2 (two) times daily as needed for dry skin. 400 g 0  . atorvastatin (LIPITOR) 40 MG tablet Take 1 tablet (40 mg total) by mouth daily. 90 tablet 3  . baclofen (LIORESAL) 10 MG tablet Take 2 tablets (20 mg total) by mouth 3 (three) times daily. 180 tablet 1  . blood glucose meter kit and supplies KIT Dispense based on patient and insurance preference. Use up to four times daily as directed. (FOR ICD-9 250.00, 250.01). 1 each 0  . busPIRone (BUSPAR) 10 MG tablet Take 1 tablet (10 mg total) by mouth 3 (three) times daily. 90 tablet 11  . Capsaicin-Menthol (PAIN RELIEF EX) Apply 1 application topically 3 (three) times daily as needed (shoulder pain).    . citalopram (CELEXA) 40 MG tablet TAKE 1 TABLET BY MOUTH EVERY DAY (Patient taking differently: Take 40 mg by mouth every morning. ) 90 tablet 0  . diclofenac sodium (VOLTAREN) 1 % GEL APPLY 2 GRAMS TO AFFECTED AREA 4 TIMES A DAY (Patient taking differently: Apply 2 g topically 4 (four) times daily. ) 300 g 2  . diltiazem (CARDIZEM CD) 120 MG 24 hr capsule Take 1 capsule (120 mg total) by mouth 2 (two) times daily. 180 capsule 3  . docusate sodium (COLACE) 100 MG capsule Take 200 mg by mouth 2 (two) times daily.    . fluticasone (FLONASE) 50 MCG/ACT nasal spray Place 2 sprays into both nostrils 2 (two) times daily. 16 g 1  . furosemide (LASIX) 40 MG tablet Take 2 tablets (80 mg total) by mouth  2 (two) times daily. 60 tablet 0  . gabapentin (NEURONTIN) 300 MG capsule TAKE 2 CAPSULES (600 MG TOTAL) BY MOUTH 3 (THREE) TIMES DAILY. 540 capsule 2  . HYDROcodone-acetaminophen (NORCO) 5-325 MG tablet Take 1 tablet by mouth daily as needed. 10 tablet 0  . HYDROcodone-acetaminophen (NORCO) 7.5-325 MG tablet Take 1 tablet by mouth every 6 (six) hours as needed for moderate pain. 20 tablet 0  . hydrOXYzine (VISTARIL) 100 MG capsule Take 1 capsule (100 mg total) by mouth 3 (three) times daily as needed for itching. 30 capsule 0  . Insulin Glargine (LANTUS SOLOSTAR) 100 UNIT/ML Solostar Pen Inject 10 Units into the skin daily. 15 mL 3  . insulin lispro (HUMALOG KWIKPEN) 100 UNIT/ML KiwkPen Inject 0.03 mLs (3 Units total) into the skin 3 (three) times daily before meals. 15 mL 2  . Insulin Pen Needle (B-D UF III MINI PEN NEEDLES) 31G X 5 MM MISC CHECK SUGARS 4 TIMES A DAY BEFORE MEALS AND AT BEDTIME. 100 each 12  . JARDIANCE 25 MG TABS tablet TAKE 1 TABLET BY MOUTH EVERY DAY (Patient taking differently: Take 25 mg by mouth every morning. Give in the morning with or without food.) 90 tablet 0  . ketoconazole (NIZORAL) 2 % cream Apply 1 fingertip amount to each foot daily. 30 g 0  . loratadine (CLARITIN) 10 MG tablet Take 1 tablet (10 mg total) by mouth daily. 90 tablet 0  . losartan (COZAAR) 100 MG tablet Take 1 tablet (100 mg total) by mouth daily. 90 tablet 3  . montelukast (SINGULAIR) 10 MG tablet Take 1 tablet (10 mg total) by mouth at bedtime. AS DIRECTED 90 tablet 3  . Multiple Vitamins-Minerals (MULTIVITAMIN WITH MINERALS) tablet Take 1 tablet by mouth daily.    . mupirocin ointment (BACTROBAN) 2 % Apply 1 application topically 2 (two) times daily. 22 g 0  . NON FORMULARY Uses a C-PAP at bedtime    .  nystatin (MYCOSTATIN) 100000 UNIT/ML suspension Take 5 mLs (500,000 Units total) by mouth 4 (four) times daily. Swish and swallow. (Patient taking differently: Take 5 mLs by mouth daily as needed  (mouth sores). Swish and swallow.) 60 mL 3  . omeprazole (PRILOSEC) 20 MG capsule Take 1 capsule (20 mg total) by mouth daily as needed. 90 capsule 3  . oxybutynin (DITROPAN XL) 10 MG 24 hr tablet Take 1 tablet (10 mg total) by mouth at bedtime. 30 tablet 0  . OXYGEN Inhale 2 L into the lungs continuous.    . promethazine (PHENERGAN) 25 MG tablet Take 1 tablet (25 mg total) by mouth every 6 (six) hours as needed for nausea. 30 tablet 1  . Vitamin D, Ergocalciferol, (DRISDOL) 50000 units CAPS capsule Take 50,000 Units by mouth every 30 (thirty) days.     . metolazone (ZAROXOLYN) 2.5 MG tablet Take 1 tablet ONLY on 01/28/2018, then no more until instructed. 10 tablet 0  . TRELEGY ELLIPTA 100-62.5-25 MCG/INH AEPB INHALE 1 PUFF INTO THE LUNGS DAILY (Patient taking differently: Inhale 1 puff into the lungs daily. ) 60 each 2   Facility-Administered Medications Prior to Visit  Medication Dose Route Frequency Provider Last Rate Last Dose  . technetium tetrofosmin (TC-MYOVIEW) injection 41.3 millicurie  24.4 millicurie Intravenous Once PRN Turner, Eber Hong, MD         Allergies:   Chantix [varenicline tartrate]; Corticosteroids; Methadone hcl; Aspirin; and Levofloxacin   Social History   Socioeconomic History  . Marital status: Divorced    Spouse name: Not on file  . Number of children: 1  . Years of education: 18  . Highest education level: 12th grade  Occupational History  . Not on file  Social Needs  . Financial resource strain: Not hard at all  . Food insecurity:    Worry: Never true    Inability: Never true  . Transportation needs:    Medical: No    Non-medical: No  Tobacco Use  . Smoking status: Former Smoker    Packs/day: 1.00    Years: 40.00    Pack years: 40.00    Types: Cigarettes    Start date: 01/28/1977    Last attempt to quit: 09/07/2017    Years since quitting: 0.4  . Smokeless tobacco: Never Used  . Tobacco comment: Quit prior to hospitalization using low dose Chantix    Substance and Sexual Activity  . Alcohol use: No    Alcohol/week: 0.0 standard drinks  . Drug use: Not Currently    Types: Marijuana, "Crack" cocaine    Comment: hx marijuana usemany years ago, Crack -none since 11/2015  . Sexual activity: Not Currently    Partners: Male    Birth control/protection: Post-menopausal  Lifestyle  . Physical activity:    Days per week: 0 days    Minutes per session: 0 min  . Stress: Not at all  Relationships  . Social connections:    Talks on phone: More than three times a week    Gets together: Twice a week    Attends religious service: 1 to 4 times per year    Active member of club or organization: No    Attends meetings of clubs or organizations: Never    Relationship status: Divorced  Other Topics Concern  . Not on file  Social History Narrative   Current Social History       Who lives at home: Patient lives alone in one level home; has 4 steps onto  porch with a handrail. Has smoke alarms, no throw rugs. Has elevated toilet. Has shower bench in tub with grab rails.    Transportation: Patient has own vehicle and drives herself    Important Relationships "Family and friends"    Pets: None    Likes to eat varied diet. Seafood, fish, roasted [place] breast, vegetables and salads and fruits.   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 I'm a lovable person."                                                                                                     Family History:  The patient's family history includes Alcohol abuse in her father; Arthritis in her sister; Asthma in her sister and another family member; Breast cancer in her maternal aunt; COPD in an other family member; Cancer in her  maternal aunt; Depression in her sister; Diabetes in an other family member; Hypertension in an other family member; Pneumonia in her father; Pulmonary embolism in her mother; Sickle cell anemia in her son; Stroke in her father.   ROS:   Please see the history of present illness.    ROS All other systems reviewed and are negative.   PHYSICAL EXAM:   VS:  BP 132/80   Pulse (!) 48   Ht 5' 10" (1.778 m)   Wt (!) 416 lb (188.7 kg)   LMP 04/10/2006   BMI 59.69 kg/m    GEN: Well nourished, well developed, in no acute distress  HEENT: normal  Neck: no JVD, carotid bruits, or masses Cardiac: RRR; no murmurs, rubs, or gallops. 1-2+ pitting edema  Respiratory:  clear to auscultation bilaterally, normal work of breathing GI: soft, nontender, nondistended, + BS MS: no deformity or atrophy  Skin: warm and dry, no rash Neuro:  Alert and Oriented x 3, Strength and sensation are intact Psych: euthymic mood, full affect  Wt Readings from Last 3 Encounters:  02/04/18 (!) 416 lb (188.7 kg)  02/03/18 (!) 415 lb 12.8 oz (188.6 kg)  01/30/18 (!) 413 lb (187.3 kg)      Studies/Labs Reviewed:   EKG:  EKG is ordered today.  The ekg ordered today demonstrates normal sinus rhythm with frequent PACs  Recent Labs: 10/01/2017: TSH 0.419 10/07/2017: Magnesium 1.9 01/15/2018: ALT 28 01/27/2018: B Natriuretic Peptide 36.9; Hemoglobin 14.6; Platelets 344 02/04/2018: BUN 18; Creatinine, Ser 1.41; Potassium 4.2; Sodium 143   Lipid Panel    Component Value Date/Time   CHOL 145 10/27/2017 0944   TRIG 98 10/27/2017 0944   HDL 55 10/27/2017 0944   CHOLHDL 2.6 10/27/2017 0944   CHOLHDL 4.0 04/17/2015 0917   VLDL 34 (H) 04/17/2015 0917   LDLCALC 70 10/27/2017 0944   LDLDIRECT 114 (H)  09/15/2012 1559    Additional studies/ records that were reviewed today include:   Echo 10/01/2017 LV EF: 55% -   60% Study Conclusions  - Left ventricle: The cavity size was normal. Wall thickness was   normal. Systolic  function was normal. The estimated ejection   fraction was in the range of 55% to 60%. Wall motion was normal;   there were no regional wall motion abnormalities. Doppler   parameters are consistent with abnormal left ventricular   relaxation (grade 1 diastolic dysfunction).   ASSESSMENT:    1. Acute on chronic diastolic heart failure ([place])   2. SVT (supraventricular tachycardia) ([place])   3. Morbid obesity ([place])   4. Controlled type 2 diabetes mellitus without complication, with long-term current use of insulin ([place])   5. Essential hypertension   6. OSA on CPAP      PLAN:  In order of problems listed above:  1. Acute on chronic diastolic heart failure: She did respond to the metolazone, however quickly gained the weight back.  I will change metolazone to twice weekly dosing.  She will need to take that dose of potassium on the day she take the metolazone.  Obtain basic metabolic panel today and also in 1 week.  2. Hypertension: Blood pressure stable  3. DM2: Managed by primary care provider, on insulin  4. History of SVT: Well-controlled on diltiazem, no recurrence  5. Morbid obesity: Weight loss is advised    Medication Adjustments/Labs and Tests Ordered: Current medicines are reviewed at length with the patient today.  Concerns regarding medicines are outlined above.  Medication changes, Labs and Tests ordered today are listed in the Patient Instructions below. Patient Instructions  Medication Instructions:  INCREASE Metolazone to 2.67m Take 1 tablet twice weekly on Monday and Thursday  START Potassium 219m Take 1 tablet twice weekly Monday and Thursday   If you need a refill on your cardiac medications before your next appointment, please call your pharmacy.   Lab work: Your physician recommends that you return for lab work in: [place] you have labs (blood work) drawn today and your tests are completely normal, you will receive your results only  by: . [name] KitchenyChart Message (if you have MyChart) OR . A paper copy in the mail If you have any lab test that is abnormal or we need to change your treatment, we will call you to review the results.  Testing/Procedures: None   Follow-Up: [facility].  As part of our continuing mission to provide you with exceptional heart care, we have created designated Provider Care Teams.  These Care Teams include your primary Cardiologist (physician) and Advanced Practice Providers (APPs -  Physician Assistants and Nurse Practitioners) who all work together to provide you with the care you need, when you need it.  . Your physician recommends that you schedule a follow-up appointment in: 3-4 WEEKS WITH  , PA-C  Any Other Special Instructions Will Be Listed Below (If Applicable).      SiHilbert CorriganPAUtah1/10/2018 11:27 PM    [facility] [address] [place]  [id number]: [phone]: [phone]

## 2018-02-05 DIAGNOSIS — G4733 Obstructive sleep apnea (adult) (pediatric): Secondary | ICD-10-CM | POA: Diagnosis not present

## 2018-02-05 DIAGNOSIS — J9621 Acute and chronic respiratory failure with hypoxia: Secondary | ICD-10-CM | POA: Diagnosis not present

## 2018-02-05 DIAGNOSIS — M5090 Cervical disc disorder, unspecified, unspecified cervical region: Secondary | ICD-10-CM | POA: Diagnosis not present

## 2018-02-05 LAB — BASIC METABOLIC PANEL
BUN / CREAT RATIO: 13 (ref 9–23)
BUN: 18 mg/dL (ref 6–24)
CO2: 24 mmol/L (ref 20–29)
Calcium: 9 mg/dL (ref 8.7–10.2)
Chloride: 102 mmol/L (ref 96–106)
Creatinine, Ser: 1.41 mg/dL — ABNORMAL HIGH (ref 0.57–1.00)
GFR, EST AFRICAN AMERICAN: 49 mL/min/{1.73_m2} — AB (ref >59)
GFR, EST NON AFRICAN AMERICAN: 42 mL/min/{1.73_m2} — AB (ref >59)
Glucose: 110 mg/dL — ABNORMAL HIGH (ref 65–99)
Potassium: 4.2 mmol/L (ref 3.5–5.2)
Sodium: 143 mmol/L (ref 134–144)

## 2018-02-05 MED ORDER — FLUTICASONE-UMECLIDIN-VILANT 100-62.5-25 MCG/INH IN AEPB: 1.0000 | INHALATION_SPRAY | Freq: Every day | RESPIRATORY_TRACT | 3 refills | Status: DC

## 2018-02-05 NOTE — Progress Notes (Signed)
Kidney function and electrolyte stable.

## 2018-02-06 ENCOUNTER — Telehealth: Payer: Self-pay

## 2018-02-06 ENCOUNTER — Other Ambulatory Visit (HOSPITAL_COMMUNITY): Payer: Self-pay | Admitting: Internal Medicine

## 2018-02-06 ENCOUNTER — Encounter: Payer: Self-pay | Admitting: Physician Assistant

## 2018-02-06 NOTE — Telephone Encounter (Signed)
Received a patient health status questionnaire from [employer]. Questionnaire filled out and faxed back to requesting party.

## 2018-02-09 ENCOUNTER — Ambulatory Visit (INDEPENDENT_AMBULATORY_CARE_PROVIDER_SITE_OTHER): Payer: Medicare Other | Admitting: Specialist

## 2018-02-09 DIAGNOSIS — I471 Supraventricular tachycardia: Secondary | ICD-10-CM | POA: Diagnosis not present

## 2018-02-10 ENCOUNTER — Encounter (INDEPENDENT_AMBULATORY_CARE_PROVIDER_SITE_OTHER): Payer: Self-pay | Admitting: Specialist

## 2018-02-10 LAB — BASIC METABOLIC PANEL
BUN/Creatinine Ratio: 18 (ref 9–23)
BUN: 32 mg/dL — AB (ref 6–24)
CO2: 23 mmol/L (ref 20–29)
Calcium: 9.8 mg/dL (ref 8.7–10.2)
Chloride: 98 mmol/L (ref 96–106)
Creatinine, Ser: 1.74 mg/dL — ABNORMAL HIGH (ref 0.57–1.00)
GFR calc non Af Amer: 33 mL/min/{1.73_m2} — ABNORMAL LOW (ref >59)
GFR, EST AFRICAN AMERICAN: 38 mL/min/{1.73_m2} — AB (ref >59)
Glucose: 146 mg/dL — ABNORMAL HIGH (ref 65–99)
POTASSIUM: 3.8 mmol/L (ref 3.5–5.2)
Sodium: 139 mmol/L (ref 134–144)

## 2018-02-11 ENCOUNTER — Telehealth: Payer: Self-pay | Admitting: *Deleted

## 2018-02-11 ENCOUNTER — Telehealth (INDEPENDENT_AMBULATORY_CARE_PROVIDER_SITE_OTHER): Payer: Self-pay | Admitting: *Deleted

## 2018-02-11 DIAGNOSIS — G4733 Obstructive sleep apnea (adult) (pediatric): Secondary | ICD-10-CM | POA: Diagnosis not present

## 2018-02-11 NOTE — Telephone Encounter (Signed)
Order to different pain management has been faxed to [facility] pain.

## 2018-02-11 NOTE — Telephone Encounter (Signed)
Patient states that she was here the other day and Dr. Valentina Lucks gave her " a round and flat battery" for her meter.  She states that now this battery is dead.  She states that Dr. Valentina Lucks told her to call if this battery did not work.  Will forward to Dr. Valentina Lucks. [place], Salome Spotted, [place]

## 2018-02-11 NOTE — Telephone Encounter (Signed)
Please disregard, made in error

## 2018-02-12 ENCOUNTER — Encounter: Payer: Self-pay | Admitting: Dietician

## 2018-02-12 ENCOUNTER — Encounter: Payer: Medicare Other | Attending: General Surgery | Admitting: Dietician

## 2018-02-12 VITALS — Wt >= 6400 oz

## 2018-02-12 DIAGNOSIS — E1122 Type 2 diabetes mellitus with diabetic chronic kidney disease: Secondary | ICD-10-CM | POA: Insufficient documentation

## 2018-02-12 DIAGNOSIS — Z6841 Body Mass Index (BMI) 40.0 and over, adult: Secondary | ICD-10-CM | POA: Insufficient documentation

## 2018-02-12 DIAGNOSIS — N184 Chronic kidney disease, stage 4 (severe): Secondary | ICD-10-CM | POA: Diagnosis not present

## 2018-02-12 DIAGNOSIS — F418 Other specified anxiety disorders: Secondary | ICD-10-CM | POA: Insufficient documentation

## 2018-02-12 DIAGNOSIS — Z713 Dietary counseling and surveillance: Secondary | ICD-10-CM | POA: Diagnosis not present

## 2018-02-12 DIAGNOSIS — J449 Chronic obstructive pulmonary disease, unspecified: Secondary | ICD-10-CM | POA: Insufficient documentation

## 2018-02-12 DIAGNOSIS — E785 Hyperlipidemia, unspecified: Secondary | ICD-10-CM | POA: Diagnosis not present

## 2018-02-12 DIAGNOSIS — E669 Obesity, unspecified: Secondary | ICD-10-CM | POA: Insufficient documentation

## 2018-02-12 DIAGNOSIS — I129 Hypertensive chronic kidney disease with stage 1 through stage 4 chronic kidney disease, or unspecified chronic kidney disease: Secondary | ICD-10-CM | POA: Insufficient documentation

## 2018-02-12 DIAGNOSIS — K219 Gastro-esophageal reflux disease without esophagitis: Secondary | ICD-10-CM | POA: Insufficient documentation

## 2018-02-12 NOTE — Progress Notes (Signed)
Bariatric Supervised Weight Loss Visit Appt Start Time: 10:45am  End Time: 11:15am  Planned Surgery: RYGB   1st out of 5 SWL Appointments   Pt requires wheelchair for transportation to and from her vehicle.   NUTRITION ASSESSMENT  Anthropometrics  Start weight at [facility]: 405.6 lbs (01/15/2018) Today's weight: 405 lbs Weight change: -0.6 lbs since previous visit on 01/15/2018 BMI: 58 kg/m2    Clinical  Medications: see list (added metolazone, kloro-con m20 since last visit)   Supplements: MVI  Psychosocial/Lifestyle Pt lives alone, and her son performs the food shopping for her. Pt's ability to walk/move is restricted dt health conditions. Pt is talkative.  24-Hr Dietary Recall First Meal: sometimes skips (or bananas)  Snack: grapes  Second Meal: smoked [place] wings + white rice + brussels sprouts Snack: oranges Third Meal: smoked [place] wings + white rice + brussels sprouts  Snack: fruit Beverages: water + G2 + kool-aid   Food & Nutrition Related Hx Dietary Hx: Pt states she usually fixes a lot of food at one time then eats it for several meals until it is gone. Pt states she has reduced the amount of sugar she consumes. Pt states she has not had tea in a long time. Pt states she is going to use organic sugar to sweeten things. Pt states she avoids dairy products, butter, olive oil, and heavy greasy foods to avoid heartburn. Pt states she plans to try skinny pop popcorn instead of regular popcorn.  Estimated Daily Fluid Intake: <2 L (on fluid restriction)  GI / Other Notable Symptoms: none  Physical Activity  Current average weekly physical activity: some chair exercises, minimal walking   Estimated Energy Needs Calories: 1600 Carbohydrate: 180g Protein: 120g Fat: 44g  Progress Towards Pre-Op Goals Previously Chosen . Pt has not downloaded MyFitness Pal.  . Pt has been making better food choices, focusing on keeping grams of fat and sugar 9 or less per serving.  . Pt  has found she likes Premier Protein shakes.    NUTRITION DIAGNOSIS  Overweight/obesity ([place]-3.3) related to past poor dietary habits and physical inactivity as evidenced by patient w/ planned RYGB surgery following dietary guidelines for continued weight loss.    NUTRITION INTERVENTION  Nutrition counseling (C-1) and education (E-2) to facilitate bariatric surgery goals.  New Pre-Op Goals to Work On . Practice not drinking 15 minutes before, during, and 30 minutes after each meal and snack.   Handouts Provided Include   Chair Exercises  Pre-Op Goals   Learning Style & Readiness for Change Teaching method utilized: Visual & Auditory  Demonstrated degree of understanding via: Teach Back  Barriers to learning/adherence to lifestyle change: multiple health conditions limit ability to be active   MONITORING & EVALUATION Dietary intake, weekly physical activity, body weight, and pre-op goals.   Next Steps  Notes for next SWL Visit:   Assess progress made on Pre-Op Goals discussed thus far.   Provide Vitamins & Minerals sheet.   Patient is to return to [facility] in 1 month for 2nd SWL Visit.

## 2018-02-13 ENCOUNTER — Telehealth: Payer: Self-pay

## 2018-02-13 ENCOUNTER — Encounter: Payer: Self-pay | Admitting: Pulmonary Disease

## 2018-02-13 ENCOUNTER — Ambulatory Visit: Payer: Medicare Other | Admitting: Dietician

## 2018-02-13 ENCOUNTER — Ambulatory Visit (INDEPENDENT_AMBULATORY_CARE_PROVIDER_SITE_OTHER): Payer: Medicare Other | Admitting: Pulmonary Disease

## 2018-02-13 VITALS — BP 126/72 | HR 96 | Ht 70.0 in | Wt >= 6400 oz

## 2018-02-13 DIAGNOSIS — J984 Other disorders of lung: Secondary | ICD-10-CM

## 2018-02-13 DIAGNOSIS — R911 Solitary pulmonary nodule: Secondary | ICD-10-CM

## 2018-02-13 DIAGNOSIS — E662 Morbid (severe) obesity with alveolar hypoventilation: Secondary | ICD-10-CM | POA: Diagnosis not present

## 2018-02-13 DIAGNOSIS — G4733 Obstructive sleep apnea (adult) (pediatric): Secondary | ICD-10-CM | POA: Diagnosis not present

## 2018-02-13 DIAGNOSIS — E669 Obesity, unspecified: Secondary | ICD-10-CM

## 2018-02-13 NOTE — Progress Notes (Signed)
Synopsis: Referred in November 2019 for COPD by [name], MD  Subjective:   PATIENT ID: [name] GENDER: female DOB: 03/25/1965, MRN: [id number]  Chief Complaint  Patient presents with  . Follow-up    States her breathing has been improved. She states today is day 4 without her oxygen. She is working on loosing weight.     PMH of OSA, OHS, on BiPAP, Chronic respiratory failure on 2L day and night. Long time smoker past 40 years 1.5ppd. Recent CT scan with RUL and LUL lung nodule. She has trouble walking and talking at the same time. Currently just managed with albuterol inhaler. Also on trelegy inhaler. Planning to go on the 2nd for evaluation of lap-band surgery. Its a 6 month program that she is enrolling in. She does have allergies to seasons, outdoor and pollen.  Overall the patient has had significant shortness of breath with exertion or minimal activity.  She does understand that her weight plays a large role in this.  She has been trying to watch what she is eating.  She has been successful at quitting smoking within the past year.  She has a sleep study scheduled for evaluation of her OSA and getting a new machine and new mask.  She has daily cough, denies sputum production, denies recent exacerbation.  She has been told that she has COPD but has not had full PFTs before.  She does have chronic diastolic heart failure that is managed with diuretics.  She watches her fluid intake.  She has noticed some edema in her lower extremities.  OV 02/13/2018: She has been doing really well. She started a new fluid pill and feels like it has been better results. She has lost several pounds proximately 20 since she was last seen with [place].  She also obtained a new BiPAP machine for treatment of her obstructive sleep apnea.  She really likes the new machine and has been sleeping so much better.  She has been using a new humidifier which has made it tolerable.  She has been checking her oxygen on a  regular basis.  Most of the time with her new finger O2 sat probe she is in the mid 90s.  And has not needed oxygen at rest and even with exertion in most places.  She does keep it with her just in case.  She is still using oxygen at nighttime with her Pap therapy.  At this point her respiratory complaints are approximately the same.  She does have a follow-up CT scan for March 2020 for bilateral lung nodules and PFTs that have been ordered but not yet completed.  Office spirometry last time revealed reduced spirometry no evidence of obstruction.  Past Medical History:  Diagnosis Date  . Anxiety   . Arthritis 04-10-11   hips, shoulders, back  . Asthma   . Bipolar disorder ([place])   . Cancer ([place]) 1993   cervical, no treatment done, went away per pt  . Cervical dysplasia or atypia 04-10-11   '93- once dx.-got pregnant-no intervention, then postpartum, no dysplasia found  . CHF (congestive heart failure) ([place])    no cardiologist 2014 dx, none now  . Chronic kidney disease    stage 3 kidney disease  . 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 Pathology showed Condyloma, gluteal CONDYLOMA ACUMINATUM.   [name] Kitchen COPD (chronic obstructive pulmonary disease) ([place])   . Diabetes mellitus without complication ([place])   . Dyspnea  with actity, sitting  . Fall 12/16/2016  . Fibrocystic breast disease   . GERD (gastroesophageal reflux disease)   . Hepatitis    hep B-count is low at present,doesn't register  . Hypercalcemia   . Hyperlipidemia   . Hypertension   . Lung nodule 11/2017   right and left lung  . Migraine headache    none recent  . Morbid obesity ([place]) 03/27/2006  . Neuropathy   . Pneumonia    walking pneu 15 yrs. ago  . Skin lesion 03/15/2011   In gluteal crease now s/p removal by Dr. [name] of General Surgery on 3/12. Path shows condyloma.     . Sleep apnea 04-10-11   uses cpap, pt does not know settings  . Sleep apnea   . Trigger finger    left third      Family History  Problem Relation Age of Onset  . Pulmonary embolism Mother   . Stroke Father   . Alcohol abuse Father   . Pneumonia Father   . Cancer Maternal Aunt        breast  . Breast cancer Maternal Aunt   . Asthma Other   . COPD Other   . Hypertension Other   . Diabetes Other   . Asthma Sister   . Depression Sister   . Arthritis Sister   . Sickle cell anemia Son      Past Surgical History:  Procedure Laterality Date  . ANAL FISTULECTOMY  04/17/2011   Procedure: FISTULECTOMY ANAL;  Surgeon: Imogene Burn. [name], MD;  Location: [facility];  Service: General;  Laterality: N/A;  Excision of Condyloma Gluteal Cleft   . BACK SURGERY  04-10-11   x5-Lumbar fusion-retained hardware.(Dr. Louanne Skye)  . BREAST CYST EXCISION     bilateral breast, 3 cysts removed from each breast  . BREAST EXCISIONAL BIOPSY Right    x 3  . BREAST EXCISIONAL BIOPSY Left    x 3  . CARPAL TUNNEL RELEASE Bilateral   . Cervical biospy    . COLONOSCOPY WITH PROPOFOL N/A 07/06/2015   Procedure: COLONOSCOPY WITH PROPOFOL;  Surgeon: [name], MD;  Location: [facility];  Service: Endoscopy;  Laterality: N/A;  . DOPPLER ECHOCARDIOGRAPHY  04/06/2012   AT [place] 55-60%  . FRACTURE SURGERY    . I&D EXTREMITY Left 12/18/2016   Procedure: IRRIGATION AND DEBRIDEMENT LEFT FOOT, CLOSURE;  Surgeon: [name], MD;  Location: [place];  Service: Orthopedics;  Laterality: Left;  [name] Kitchen MULTIPLE TOOTH EXTRACTIONS    . NECK SURGERY  04-10-11   x3- cervical fusion with plating and screws-Dr. [name]  . TEE WITHOUT CARDIOVERSION N/A 04/06/2012   Procedure: TRANSESOPHAGEAL ECHOCARDIOGRAM (TEE);  Surgeon: [name], MD;  Location: [facility] ENDOSCOPY;  Service: Cardiovascular;  Laterality: N/A;  . TRIGGER FINGER RELEASE Right    middle finger  . TRIGGER FINGER RELEASE Left 06/28/2016   Procedure: RELEASE TRIGGER FINGER LEFT 3RD FINGER;  Surgeon: [name], MD;  Location: [place];  Service: Orthopedics;  Laterality: Left;  .  TRIGGER FINGER RELEASE Right 06/12/2016   Procedure: RIGHT INDEX FINGER TRIGGER RELEASE;  Surgeon: [name], MD;  Location: [place];  Service: Orthopedics;  Laterality: Right;  . TRIGGER FINGER RELEASE Right 01/19/2018   Procedure: RIGHT RING FINGER TRIGGER FINGER RELEASE;  Surgeon: [name], MD;  Location: [place];  Service: Orthopedics;  Laterality: Right;    Social History   Socioeconomic History  . Marital status: Divorced  Spouse name: Not on file  . Number of children: 1  . Years of education: 57  . Highest education level: 12th grade  Occupational History  . Not on file  Social Needs  . Financial resource strain: Not hard at all  . Food insecurity:    Worry: Never true    Inability: Never true  . Transportation needs:    Medical: No    Non-medical: No  Tobacco Use  . Smoking status: Former Smoker    Packs/day: 1.00    Years: 40.00    Pack years: 40.00    Types: Cigarettes    Start date: 01/28/1977    Last attempt to quit: 09/07/2017    Years since quitting: 0.4  . Smokeless tobacco: Never Used  . Tobacco comment: Quit prior to hospitalization using low dose Chantix  Substance and Sexual Activity  . Alcohol use: No    Alcohol/week: 0.0 standard drinks  . Drug use: Not Currently    Types: Marijuana, "Crack" cocaine    Comment: hx marijuana usemany years ago, Crack -none since 11/2015  . Sexual activity: Not Currently    Partners: Male    Birth control/protection: Post-menopausal  Lifestyle  . Physical activity:    Days per week: 0 days    Minutes per session: 0 min  . Stress: Not at all  Relationships  . Social connections:    Talks on phone: More than three times a week    Gets together: Twice a week    Attends religious service: 1 to 4 times per year    Active member of club or organization: No    Attends meetings of clubs or organizations: Never    Relationship status: Divorced  . Intimate partner violence:    Fear of current or ex partner: No     Emotionally abused: No    Physically abused: No    Forced sexual activity: No  Other Topics Concern  . Not on file  Social History Narrative   Current Social History       Who lives at home: Patient lives alone in one level home; has 4 steps onto porch with a handrail. Has smoke alarms, no throw rugs. Has elevated toilet. Has shower bench in tub with grab rails.    Transportation: Patient has own vehicle and drives herself    Important Relationships "Family and friends"    Pets: None    Likes to eat varied diet. Seafood, fish, roasted [place] breast, vegetables and salads and fruits.   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 I'm a lovable person."  Allergies  Allergen Reactions  . Chantix [Varenicline Tartrate] Other (See Comments)    On the 1 mg dose, patient experienced hallucintation  . Corticosteroids Other (See Comments)    HYPERGLYCEMIA  . 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 Aspirin Other (See Comments)    Kidney disease  . Levofloxacin Itching     Outpatient Medications Prior to Visit  Medication Sig Dispense Refill  . ACCU-CHEK AVIVA PLUS test strip TEST 4 TIMES DAILY 100 each 12  . ACCU-CHEK SOFTCLIX LANCETS lancets TEST 4 TIMES DAILY 200 each 6  . albuterol (PROAIR HFA) 108 (90 Base) MCG/ACT inhaler Inhale 2 puffs into the lungs every 6 (six) hours as needed for shortness of breath. 1 Inhaler 1  . allopurinol (ZYLOPRIM) 100 MG tablet Take 100 mg by mouth daily.    [name] Kitchen ammonium lactate (AMLACTIN) 12 % lotion Apply 1 application topically 2 (two) times daily as needed for dry skin. 400 g 0  .  atorvastatin (LIPITOR) 40 MG tablet Take 1 tablet (40 mg total) by mouth daily. 90 tablet 3  . baclofen (LIORESAL) 10 MG tablet Take 2 tablets (20 mg total) by mouth 3 (three) times daily. 180 tablet 1  . blood glucose meter kit and supplies KIT Dispense based on patient and insurance preference. Use up to four times daily as directed. (FOR ICD-9 250.00, 250.01). 1 each 0  . busPIRone (BUSPAR) 10 MG tablet Take 1 tablet (10 mg total) by mouth 3 (three) times daily. 90 tablet 11  . Capsaicin-Menthol (PAIN RELIEF EX) Apply 1 application topically 3 (three) times daily as needed (shoulder pain).    . citalopram (CELEXA) 40 MG tablet TAKE 1 TABLET BY MOUTH EVERY DAY (Patient taking differently: Take 40 mg by mouth every morning. ) 90 tablet 0  . diclofenac sodium (VOLTAREN) 1 % GEL APPLY 2 GRAMS TO AFFECTED AREA 4 TIMES A DAY (Patient taking differently: Apply 2 g topically 4 (four) times daily. ) 300 g 2  . diltiazem (CARDIZEM CD) 120 MG 24 hr capsule Take 1 capsule (120 mg total) by mouth 2 (two) times daily. 180 capsule 3  . docusate sodium (COLACE) 100 MG capsule Take 200 mg by mouth 2 (two) times daily.    . fluticasone (FLONASE) 50 MCG/ACT nasal spray Place 2 sprays into both nostrils 2 (two) times daily. 16 g 1  . Fluticasone-Umeclidin-Vilant (TRELEGY ELLIPTA) 100-62.5-25 MCG/INH AEPB Inhale 1 puff into the lungs daily. 1 each 3  . furosemide (LASIX) 40 MG tablet Take 2 tablets (80 mg total) by mouth 2 (two) times daily. 60 tablet 0  . gabapentin (NEURONTIN) 300 MG capsule TAKE 2 CAPSULES (600 MG TOTAL) BY MOUTH 3 (THREE) TIMES DAILY. 540 capsule 2  . HYDROcodone-acetaminophen (NORCO) 5-325 MG tablet Take 1 tablet by mouth daily as needed. 10 tablet 0  . HYDROcodone-acetaminophen (NORCO) 7.5-325 MG tablet Take 1 tablet by mouth every 6 (six) hours as needed for moderate pain. 20 tablet 0  . hydrOXYzine (VISTARIL) 100 MG capsule Take 1 capsule (100 mg total) by mouth 3 (three) times daily as needed  for itching. 30 capsule 0  . Insulin Glargine (LANTUS SOLOSTAR) 100 UNIT/ML Solostar Pen Inject 10 Units into the skin daily. 15 mL 3  . insulin lispro (HUMALOG KWIKPEN) 100 UNIT/ML KiwkPen Inject 0.03 mLs (3 Units total) into the skin 3 (three) times daily before meals. 15 mL 2  . Insulin Pen Needle (B-D UF III MINI  PEN NEEDLES) 31G X 5 MM MISC CHECK SUGARS 4 TIMES A DAY BEFORE MEALS AND AT BEDTIME. 100 each 12  . JARDIANCE 25 MG TABS tablet TAKE 1 TABLET BY MOUTH EVERY DAY (Patient taking differently: Take 25 mg by mouth every morning. Give in the morning with or without food.) 90 tablet 0  . ketoconazole (NIZORAL) 2 % cream Apply 1 fingertip amount to each foot daily. 30 g 0  . loratadine (CLARITIN) 10 MG tablet Take 1 tablet (10 mg total) by mouth daily. 90 tablet 0  . losartan (COZAAR) 100 MG tablet Take 1 tablet (100 mg total) by mouth daily. 90 tablet 3  . metolazone (ZAROXOLYN) 2.5 MG tablet Take 1 tablet twice weekly on Mondays and Thursdays ONLY 30 tablet 0  . montelukast (SINGULAIR) 10 MG tablet Take 1 tablet (10 mg total) by mouth at bedtime. AS DIRECTED 90 tablet 3  . Multiple Vitamins-Minerals (MULTIVITAMIN WITH MINERALS) tablet Take 1 tablet by mouth daily.    . mupirocin ointment (BACTROBAN) 2 % Apply 1 application topically 2 (two) times daily. 22 g 0  . NON FORMULARY Uses a C-PAP at bedtime    . nystatin (MYCOSTATIN) 100000 UNIT/ML suspension Take 5 mLs (500,000 Units total) by mouth 4 (four) times daily. Swish and swallow. (Patient taking differently: Take 5 mLs by mouth daily as needed (mouth sores). Swish and swallow.) 60 mL 3  . omeprazole (PRILOSEC) 20 MG capsule Take 1 capsule (20 mg total) by mouth daily as needed. 90 capsule 3  . oxybutynin (DITROPAN XL) 10 MG 24 hr tablet Take 1 tablet (10 mg total) by mouth at bedtime. 30 tablet 0  . OXYGEN Inhale 2 L into the lungs continuous.    . potassium chloride SA (KLOR-CON M20) 20 MEQ tablet Take 1 tablet twice weekly on Mondays  and Thursdays ONLY 30 tablet 0  . promethazine (PHENERGAN) 25 MG tablet Take 1 tablet (25 mg total) by mouth every 6 (six) hours as needed for nausea. 30 tablet 1  . Vitamin D, Ergocalciferol, (DRISDOL) 50000 units CAPS capsule Take 50,000 Units by mouth every 30 (thirty) days.      Facility-Administered Medications Prior to Visit  Medication Dose Route Frequency Provider Last Rate Last Dose  . technetium tetrofosmin (TC-MYOVIEW) injection 47.4 millicurie  25.9 millicurie Intravenous Once PRN Sueanne Margarita, MD        Review of Systems  Constitutional: Negative for chills, fever, malaise/fatigue and weight loss.  HENT: Negative for hearing loss, sore throat and tinnitus.   Eyes: Negative for blurred vision and double vision.  Respiratory: Positive for shortness of breath. Negative for cough, hemoptysis, sputum production, wheezing and stridor.   Cardiovascular: Negative for chest pain, palpitations, orthopnea, leg swelling and PND.  Gastrointestinal: Negative for abdominal pain, constipation, diarrhea, heartburn, nausea and vomiting.  Genitourinary: Negative for dysuria, hematuria and urgency.  Musculoskeletal: Negative for joint pain and myalgias.  Skin: Negative for itching and rash.  Neurological: Negative for dizziness, tingling, weakness and headaches.  Endo/Heme/Allergies: Negative for environmental allergies. Does not bruise/bleed easily.  Psychiatric/Behavioral: Negative for depression. The patient is not nervous/anxious and does not have insomnia.   All other systems reviewed and are negative.   Objective:  Physical Exam Vitals signs reviewed.  Constitutional:      General: She is not in acute distress.    Appearance: She is well-developed.  HENT:     Head: Normocephalic and atraumatic.  Eyes:     General: No scleral icterus.  Conjunctiva/sclera: Conjunctivae normal.     Pupils: Pupils are equal, round, and reactive to light.  Neck:     Musculoskeletal: Neck supple.       Vascular: No JVD.     Trachea: No tracheal deviation.     Comments: Large neck Cardiovascular:     Rate and Rhythm: Normal rate and regular rhythm.     Heart sounds: Normal heart sounds. No murmur.  Pulmonary:     Effort: Pulmonary effort is normal. No tachypnea, accessory muscle usage or respiratory distress.     Breath sounds: Normal breath sounds. No stridor. No wheezing, rhonchi or rales.  Abdominal:     General: Bowel sounds are normal. There is no distension.     Palpations: Abdomen is soft.     Tenderness: There is no abdominal tenderness.     Comments: Obese pannus  Musculoskeletal:        General: No tenderness.  Lymphadenopathy:     Cervical: No cervical adenopathy.  Skin:    General: Skin is warm and dry.     Capillary Refill: Capillary refill takes less than 2 seconds.     Findings: No rash.  Neurological:     Mental Status: She is alert and oriented to person, place, and time.  Psychiatric:        Behavior: Behavior normal.      Vitals:   02/13/18 0903  BP: 126/72  Pulse: 96  SpO2: 94%  Weight: (!) 406 lb (184.2 kg)  Height: '5\' 10"'  (1.778 m)   94% on RA BMI Readings from Last 3 Encounters:  02/13/18 58.25 kg/m  02/12/18 58.11 kg/m  02/04/18 59.69 kg/m   Wt Readings from Last 3 Encounters:  02/13/18 (!) 406 lb (184.2 kg)  02/12/18 (!) 405 lb (183.7 kg)  02/04/18 (!) 416 lb (188.7 kg)     CBC    Component Value Date/Time   WBC 9.2 01/27/2018 0535   RBC 5.69 (H) 01/27/2018 0535   HGB 14.6 01/27/2018 0535   HGB 15.9 04/23/2017 1010   HCT 44.4 01/27/2018 0535   HCT 46.2 04/23/2017 1010   PLT 344 01/27/2018 0535   PLT 360 04/23/2017 1010   MCV 78.0 (L) 01/27/2018 0535   MCV 82 04/23/2017 1010   MCH 25.7 (L) 01/27/2018 0535   MCHC 32.9 01/27/2018 0535   RDW 16.8 (H) 01/27/2018 0535   RDW 13.9 04/23/2017 1010   LYMPHSABS 2.2 01/27/2018 0535   MONOABS 1.1 (H) 01/27/2018 0535   EOSABS 0.2 01/27/2018 0535   BASOSABS 0.0 01/27/2018 0535     Chest Imaging: September 2019 CT chest: Right upper lobe 6 mm lung nodule, irregular, 4 mm irregular dense nodule left upper lobe The patient's images have been independently reviewed by me.    Pulmonary Functions Testing Results: No flowsheet data found.  FeNO: None   Pathology: None  Echocardiogram:  Study Conclusions - Left ventricle: The cavity size was normal. Wall thickness was   normal. Systolic function was normal. The estimated ejection   fraction was in the range of 55% to 60%. Wall motion was normal;   there were no regional wall motion abnormalities. Doppler   parameters are consistent with abnormal left ventricular   relaxation (grade 1 diastolic dysfunction).  Heart Catheterization: None      Assessment & Plan:   Nodule of upper lobe of right lung  Nodule of upper lobe of left lung  Obesity hypoventilation syndrome (HCC)  OSA (obstructive sleep apnea)  Obstructive  sleep apnea treated with BiPAP  Restrictive lung disease secondary to obesity  Discussion:  This is a very pleasant 53 year old female, morbidly obese, likely OHS, OSA on BiPAP now, recently obtained new BiPAP mask and machine.  Hypoxemic respiratory failure secondary to above on 2 L nasal cannula.  At this time trying to wean herself off of oxygen.  She has been monitoring her SPO2 with a new probe and has maintained in the mid 90s at baseline.  I do suspect she probably has a component of restrictive lung disease from obesity.  Office spirometry completed last time revealed decreased FEV1 and FVC with no evidence of obstruction.  She also has a upper lobe right and left 6 mm and 4 mm nodule with plans for a repeat noncontrast CT of the chest in March 2020.  This has already been ordered.  She should continue her heart failure regimen and fluid management.  We also discussed weight loss and the improvements that she is already seen in 20 pounds.  Her goals is to lose an additional 20  pounds by the next time we see each other in March 2020.  That will bring her down to 385.  She is very excited and very motivated and I encouraged this.  For now we can continue her Trelegy.  Until we obtain full PFTs that she likely has a mixed PFT pattern.  Due to her smoking history and obesity.  Patient to return to clinic in 3 months following PFTs and follow-up noncontrast CT of the chest.  Greater than 50% of this patient's 15-minute office was spent face-to-face discussing the recommendations and treatment plan   Current Outpatient Medications:  .  ACCU-CHEK AVIVA PLUS test strip, TEST 4 TIMES DAILY, Disp: 100 each, Rfl: 12 .  ACCU-CHEK SOFTCLIX LANCETS lancets, TEST 4 TIMES DAILY, Disp: 200 each, Rfl: 6 .  albuterol (PROAIR HFA) 108 (90 Base) MCG/ACT inhaler, Inhale 2 puffs into the lungs every 6 (six) hours as needed for shortness of breath., Disp: 1 Inhaler, Rfl: 1 .  allopurinol (ZYLOPRIM) 100 MG tablet, Take 100 mg by mouth daily., Disp: , Rfl:  .  ammonium lactate (AMLACTIN) 12 % lotion, Apply 1 application topically 2 (two) times daily as needed for dry skin., Disp: 400 g, Rfl: 0 .  atorvastatin (LIPITOR) 40 MG tablet, Take 1 tablet (40 mg total) by mouth daily., Disp: 90 tablet, Rfl: 3 .  baclofen (LIORESAL) 10 MG tablet, Take 2 tablets (20 mg total) by mouth 3 (three) times daily., Disp: 180 tablet, Rfl: 1 .  blood glucose meter kit and supplies KIT, Dispense based on patient and insurance preference. Use up to four times daily as directed. (FOR ICD-9 250.00, 250.01)., Disp: 1 each, Rfl: 0 .  busPIRone (BUSPAR) 10 MG tablet, Take 1 tablet (10 mg total) by mouth 3 (three) times daily., Disp: 90 tablet, Rfl: 11 .  Capsaicin-Menthol (PAIN RELIEF EX), Apply 1 application topically 3 (three) times daily as needed (shoulder pain)., Disp: , Rfl:  .  citalopram (CELEXA) 40 MG tablet, TAKE 1 TABLET BY MOUTH EVERY DAY (Patient taking differently: Take 40 mg by mouth every morning. ),  Disp: 90 tablet, Rfl: 0 .  diclofenac sodium (VOLTAREN) 1 % GEL, APPLY 2 GRAMS TO AFFECTED AREA 4 TIMES A DAY (Patient taking differently: Apply 2 g topically 4 (four) times daily. ), Disp: 300 g, Rfl: 2 .  diltiazem (CARDIZEM CD) 120 MG 24 hr capsule, Take 1 capsule (120 mg total) by  mouth 2 (two) times daily., Disp: 180 capsule, Rfl: 3 .  docusate sodium (COLACE) 100 MG capsule, Take 200 mg by mouth 2 (two) times daily., Disp: , Rfl:  .  fluticasone (FLONASE) 50 MCG/ACT nasal spray, Place 2 sprays into both nostrils 2 (two) times daily., Disp: 16 g, Rfl: 1 .  Fluticasone-Umeclidin-Vilant (TRELEGY ELLIPTA) 100-62.5-25 MCG/INH AEPB, Inhale 1 puff into the lungs daily., Disp: 1 each, Rfl: 3 .  furosemide (LASIX) 40 MG tablet, Take 2 tablets (80 mg total) by mouth 2 (two) times daily., Disp: 60 tablet, Rfl: 0 .  gabapentin (NEURONTIN) 300 MG capsule, TAKE 2 CAPSULES (600 MG TOTAL) BY MOUTH 3 (THREE) TIMES DAILY., Disp: 540 capsule, Rfl: 2 .  HYDROcodone-acetaminophen (NORCO) 5-325 MG tablet, Take 1 tablet by mouth daily as needed., Disp: 10 tablet, Rfl: 0 .  HYDROcodone-acetaminophen (NORCO) 7.5-325 MG tablet, Take 1 tablet by mouth every 6 (six) hours as needed for moderate pain., Disp: 20 tablet, Rfl: 0 .  hydrOXYzine (VISTARIL) 100 MG capsule, Take 1 capsule (100 mg total) by mouth 3 (three) times daily as needed for itching., Disp: 30 capsule, Rfl: 0 .  Insulin Glargine (LANTUS SOLOSTAR) 100 UNIT/ML Solostar Pen, Inject 10 Units into the skin daily., Disp: 15 mL, Rfl: 3 .  insulin lispro (HUMALOG KWIKPEN) 100 UNIT/ML KiwkPen, Inject 0.03 mLs (3 Units total) into the skin 3 (three) times daily before meals., Disp: 15 mL, Rfl: 2 .  Insulin Pen Needle (B-D UF III MINI PEN NEEDLES) 31G X 5 MM MISC, CHECK SUGARS 4 TIMES A DAY BEFORE MEALS AND AT BEDTIME., Disp: 100 each, Rfl: 12 .  JARDIANCE 25 MG TABS tablet, TAKE 1 TABLET BY MOUTH EVERY DAY (Patient taking differently: Take 25 mg by mouth every morning.  Give in the morning with or without food.), Disp: 90 tablet, Rfl: 0 .  ketoconazole (NIZORAL) 2 % cream, Apply 1 fingertip amount to each foot daily., Disp: 30 g, Rfl: 0 .  loratadine (CLARITIN) 10 MG tablet, Take 1 tablet (10 mg total) by mouth daily., Disp: 90 tablet, Rfl: 0 .  losartan (COZAAR) 100 MG tablet, Take 1 tablet (100 mg total) by mouth daily., Disp: 90 tablet, Rfl: 3 .  metolazone (ZAROXOLYN) 2.5 MG tablet, Take 1 tablet twice weekly on Mondays and Thursdays ONLY, Disp: 30 tablet, Rfl: 0 .  montelukast (SINGULAIR) 10 MG tablet, Take 1 tablet (10 mg total) by mouth at bedtime. AS DIRECTED, Disp: 90 tablet, Rfl: 3 .  Multiple Vitamins-Minerals (MULTIVITAMIN WITH MINERALS) tablet, Take 1 tablet by mouth daily., Disp: , Rfl:  .  mupirocin ointment (BACTROBAN) 2 %, Apply 1 application topically 2 (two) times daily., Disp: 22 g, Rfl: 0 .  NON FORMULARY, Uses a C-PAP at bedtime, Disp: , Rfl:  .  nystatin (MYCOSTATIN) 100000 UNIT/ML suspension, Take 5 mLs (500,000 Units total) by mouth 4 (four) times daily. Swish and swallow. (Patient taking differently: Take 5 mLs by mouth daily as needed (mouth sores). Swish and swallow.), Disp: 60 mL, Rfl: 3 .  omeprazole (PRILOSEC) 20 MG capsule, Take 1 capsule (20 mg total) by mouth daily as needed., Disp: 90 capsule, Rfl: 3 .  oxybutynin (DITROPAN XL) 10 MG 24 hr tablet, Take 1 tablet (10 mg total) by mouth at bedtime., Disp: 30 tablet, Rfl: 0 .  OXYGEN, Inhale 2 L into the lungs continuous., Disp: , Rfl:  .  potassium chloride SA (KLOR-CON M20) 20 MEQ tablet, Take 1 tablet twice weekly on Mondays and Thursdays  ONLY, Disp: 30 tablet, Rfl: 0 .  promethazine (PHENERGAN) 25 MG tablet, Take 1 tablet (25 mg total) by mouth every 6 (six) hours as needed for nausea., Disp: 30 tablet, Rfl: 1 .  Vitamin D, Ergocalciferol, (DRISDOL) 50000 units CAPS capsule, Take 50,000 Units by mouth every 30 (thirty) days. , Disp: , Rfl:  No current facility-administered  medications for this visit.   Facility-Administered Medications Ordered in Other Visits:  .  technetium tetrofosmin (TC-MYOVIEW) injection 39.6 millicurie, 72.8 millicurie, Intravenous, Once PRN, Radford Pax, Eber Hong, MD   Garner Nash, DO [facility] 02/13/2018 9:08 AM

## 2018-02-13 NOTE — Patient Instructions (Signed)
Thank you for visiting Dr. Valeta Harms at [facility]. Today we recommend the following:  Pulmonary Function Tests  Return in about 3 months (around 05/15/2018).

## 2018-02-13 NOTE — Telephone Encounter (Signed)
Spoke to patient about recent email.Patient stated she has been having severe cramping all over body since increase of Metolazone 2.5 mg on Mon and Thurs.Stated she is urinating frequently.Stated she her weight is down to 405.8 today.Spoke to DOD Dr.Acharya she advised to hold [place] today if possible or Monday 1/20.Schedule appointment with Almyra Deforest PA next week.Patient stated she cannot come today for bmet.She will have bmet done Mon 1/20.Appointment scheduled with Almyra Deforest PA Wed 1/29 at 2:30 pm.

## 2018-02-16 DIAGNOSIS — M129 Arthropathy, unspecified: Secondary | ICD-10-CM | POA: Diagnosis not present

## 2018-02-16 DIAGNOSIS — M5030 Other cervical disc degeneration, unspecified cervical region: Secondary | ICD-10-CM | POA: Diagnosis not present

## 2018-02-16 DIAGNOSIS — E559 Vitamin D deficiency, unspecified: Secondary | ICD-10-CM | POA: Diagnosis not present

## 2018-02-16 DIAGNOSIS — G894 Chronic pain syndrome: Secondary | ICD-10-CM | POA: Diagnosis not present

## 2018-02-16 DIAGNOSIS — Z79899 Other long term (current) drug therapy: Secondary | ICD-10-CM | POA: Diagnosis not present

## 2018-02-16 DIAGNOSIS — I471 Supraventricular tachycardia: Secondary | ICD-10-CM | POA: Diagnosis not present

## 2018-02-17 LAB — BASIC METABOLIC PANEL
BUN/Creatinine Ratio: 21 (ref 9–23)
BUN: 35 mg/dL — AB (ref 6–24)
CO2: 22 mmol/L (ref 20–29)
CREATININE: 1.7 mg/dL — AB (ref 0.57–1.00)
Calcium: 9.6 mg/dL (ref 8.7–10.2)
Chloride: 100 mmol/L (ref 96–106)
GFR calc Af Amer: 39 mL/min/{1.73_m2} — ABNORMAL LOW (ref >59)
GFR calc non Af Amer: 34 mL/min/{1.73_m2} — ABNORMAL LOW (ref >59)
Glucose: 167 mg/dL — ABNORMAL HIGH (ref 65–99)
Potassium: 4.3 mmol/L (ref 3.5–5.2)
Sodium: 141 mmol/L (ref 134–144)

## 2018-02-18 ENCOUNTER — Ambulatory Visit (INDEPENDENT_AMBULATORY_CARE_PROVIDER_SITE_OTHER): Payer: Medicare Other | Admitting: Podiatry

## 2018-02-18 ENCOUNTER — Encounter: Payer: Self-pay | Admitting: Podiatry

## 2018-02-18 ENCOUNTER — Ambulatory Visit (INDEPENDENT_AMBULATORY_CARE_PROVIDER_SITE_OTHER): Payer: Medicare Other | Admitting: Physician Assistant

## 2018-02-18 ENCOUNTER — Encounter: Payer: Self-pay | Admitting: Physician Assistant

## 2018-02-18 VITALS — BP 122/80 | HR 89 | Ht 70.0 in | Wt >= 6400 oz

## 2018-02-18 DIAGNOSIS — E8770 Fluid overload, unspecified: Secondary | ICD-10-CM | POA: Diagnosis not present

## 2018-02-18 DIAGNOSIS — M79674 Pain in right toe(s): Secondary | ICD-10-CM | POA: Diagnosis not present

## 2018-02-18 DIAGNOSIS — I471 Supraventricular tachycardia: Secondary | ICD-10-CM

## 2018-02-18 DIAGNOSIS — E119 Type 2 diabetes mellitus without complications: Secondary | ICD-10-CM | POA: Diagnosis not present

## 2018-02-18 DIAGNOSIS — M79675 Pain in left toe(s): Secondary | ICD-10-CM | POA: Diagnosis not present

## 2018-02-18 DIAGNOSIS — B351 Tinea unguium: Secondary | ICD-10-CM | POA: Diagnosis not present

## 2018-02-18 DIAGNOSIS — I5022 Chronic systolic (congestive) heart failure: Secondary | ICD-10-CM | POA: Diagnosis not present

## 2018-02-18 DIAGNOSIS — I10 Essential (primary) hypertension: Secondary | ICD-10-CM

## 2018-02-18 DIAGNOSIS — G4733 Obstructive sleep apnea (adult) (pediatric): Secondary | ICD-10-CM

## 2018-02-18 DIAGNOSIS — E1142 Type 2 diabetes mellitus with diabetic polyneuropathy: Secondary | ICD-10-CM

## 2018-02-18 DIAGNOSIS — Z9989 Dependence on other enabling machines and devices: Secondary | ICD-10-CM

## 2018-02-18 DIAGNOSIS — R600 Localized edema: Secondary | ICD-10-CM

## 2018-02-18 MED ORDER — METOLAZONE 2.5 MG PO TABS: ORAL_TABLET | ORAL | 0 refills | Status: DC

## 2018-02-18 MED ORDER — FUROSEMIDE 40 MG PO TABS: 80.0000 mg | ORAL_TABLET | Freq: Two times a day (BID) | ORAL | 3 refills | Status: DC

## 2018-02-18 MED ORDER — POTASSIUM CHLORIDE CRYS ER 20 MEQ PO TBCR: EXTENDED_RELEASE_TABLET | ORAL | 0 refills | Status: DC

## 2018-02-18 NOTE — Telephone Encounter (Signed)
Case Manger called back stating she has received requesting documentations.

## 2018-02-18 NOTE — Progress Notes (Signed)
Complaint:  Visit Type: Patient returns to my office for continued preventative foot care services. Complaint: Patient states" my nails have grown long and thick and become painful to walk and wear shoes" Patient has been diagnosed with DM with angiopathy. The patient presents for preventative foot care services. No changes to ROS.   Podiatric Exam: Vascular: dorsalis pedis and posterior tibial pulses are palpable bilateral. Capillary return is immediate. Temperature gradient is WNL. Skin turgor WNL  Sensorium: Normal Semmes Weinstein monofilament test. Normal tactile sensation bilaterally. Nail Exam: Pt has thick disfigured discolored nails with subungual debris noted bilateral entire nail hallux through fifth toenails Ulcer Exam: There is no evidence of ulcer or pre-ulcerative changes or infection. Orthopedic Exam: Muscle tone and strength are WNL. No limitations in general ROM. No crepitus or effusions noted. Foot type and digits show no abnormalities. Bony prominences are unremarkable. Skin: No Porokeratosis. No infection or ulcers.  Thick hyperkeratotic skin right foot.  Diagnosis:  Onychomycosis, , Pain in right toe, pain in left toes  Treatment & Plan Procedures and Treatment: Consent by patient was obtained for treatment procedures.   Debridement of mycotic and hypertrophic toenails, 1 through 5 bilateral and clearing of subungual debris. No ulceration, no infection noted.  Return Visit-Office Procedure: Patient instructed to return to the office for a follow up visit 10 weeks  for continued evaluation and treatment.    Gardiner Barefoot DPM

## 2018-02-18 NOTE — Telephone Encounter (Signed)
Received a duplicate questionnaire. Attempted to contact Caren Griffins (nurse case manager) to ensure she received requesting information that was faxed on 02/06/2018. Left message to call back.

## 2018-02-18 NOTE — Patient Instructions (Signed)
Medication Instructions:  Lasix refill submitted. Change Metolazone to 1 time weekly (every Monday), if your weight gets above 410 lbs you may take an extra Metolazone on Thursday. Take Potassium with Metolazone every Monday, and take extra Potassium if you have to use extra Metolazone.   If you need a refill on your cardiac medications before your next appointment, please call your pharmacy.   Lab work: BMET in 2-3 weeks If you have labs (blood work) drawn today and your tests are completely normal, you will receive your results only by: [name] Kitchen MyChart Message (if you have MyChart) OR . A paper copy in the mail If you have any lab test that is abnormal or we need to change your treatment, we will call you to review the results.   Follow-Up: At [facility], you and your health needs are our priority.  As part of our continuing mission to provide you with exceptional heart care, we have created designated Provider Care Teams.  These Care Teams include your primary Cardiologist (physician) and Advanced Practice Providers (APPs -  Physician Assistants and Nurse Practitioners) who all work together to provide you with the care you need, when you need it. [name] Kitchen Keep follow up with Dr.Christopher as scheduled.

## 2018-02-18 NOTE — Progress Notes (Signed)
Cardiology Office Note    Date:  04/23/6145   ID:  [name], DOB [birth year], MRN [id number]  PCP:  [name], MD  Cardiologist:  Dr. Harrell Gave  Chief Complaint  Patient presents with  . Follow-up    seen for Dr. Harrell Gave    History of Present Illness:  [name] is a 53 y.o. female with PMH ofmorbid obesity, type II DM, chronic diastolic heart failure, hypertension and history of SVT. Patientwas initially admitted in early September for acute on chronic diastolic heart failure. She was discharged on 40 mg twice daily of Lasix and 2 L home O2 with ambulation. She also has sleep apnea and use CPAP at home. Myoview obtained on 11/11/2017 was low risk with normal EF, possible apical septal infarct versus artifact, no ischemia. She had has a history of tobacco abuse but quit in August 2019. Patient was last seen by Dr. Harrell Gave on 01/08/2018, diltiazem was increased to 120 mg twice daily. She underwent trigger finger release on 01/19/2018. After the surgery, her weight increased from 404 pounds up to 417 pounds. This eventually increased to 424 pounds on 01/22/2018. Some of the increased volume overloaded likely also related to dietary indiscretion during the holiday season as well. She went to the ED twice in December and was given 2 doses of IV Lasix.  I increased her Lasix to 80 mg twice daily and also added twice weekly dosing of metolazone.  She returns today for follow-up.  Patient presents today for cardiology office visit.  She has not taken any Lasix since January 16 as she ran out of the medication.  She did notice significant weight drop with twice weekly dose of metolazone.  Her creatinine trended up to 1.7.  I think twice weekly dose of metolazone may be too much for her.  I will decrease metolazone to once weekly dosing.  Her weight decreased to 403 pounds before bouncing back to 413 pounds based on home scale.  I have refilled her Lasix.  She has  been instructed to take once weekly dosing of metolazone was Lasix every Monday if her weight is 410 pounds or less.  She may take a second dose of metolazone per week if her weight increased above 410 pounds.  She is planning to go to [facility] to do water aerobics to help her lose weight.  She also complained a lot of cramps, however her recent lab work showed normal potassium and calcium level.     Past Medical History:  Diagnosis Date  . Anxiety   . Arthritis 04-10-11   hips, shoulders, back  . Asthma   . Bipolar disorder ([place])   . Cancer ([place]) 1993   cervical, no treatment done, went away per pt  . Cervical dysplasia or atypia 04-10-11   '93- once dx.-got pregnant-no intervention, then postpartum, no dysplasia found  . CHF (congestive heart failure) ([place])    no cardiologist 2014 dx, none now  . Chronic kidney disease    stage 3 kidney disease  . 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 Pathology showed Condyloma, gluteal CONDYLOMA ACUMINATUM.   [name] Kitchen COPD (chronic obstructive pulmonary disease) ([place])   . Diabetes mellitus without complication ([place])   . Dyspnea    with actity, sitting  . Fall 12/16/2016  . Fibrocystic breast disease   . GERD (gastroesophageal reflux disease)   . Hepatitis    hep B-count is low at present,doesn't register  . Hypercalcemia   .  Hyperlipidemia   . Hypertension   . Lung nodule 11/2017   right and left lung  . Migraine headache    none recent  . Morbid obesity ([place]) 03/27/2006  . Neuropathy   . Pneumonia    walking pneu 15 yrs. ago  . Skin lesion 03/15/2011   In gluteal crease now s/p removal by Dr. [name] of General Surgery on 3/12. Path shows condyloma.     . Sleep apnea 04-10-11   uses cpap, pt does not know settings  . Sleep apnea   . Trigger finger    left third    Past Surgical History:  Procedure Laterality Date  . ANAL FISTULECTOMY  04/17/2011   Procedure: FISTULECTOMY ANAL;  Surgeon: Imogene Burn. [name], MD;  Location:  [facility];  Service: General;  Laterality: N/A;  Excision of Condyloma Gluteal Cleft   . BACK SURGERY  04-10-11   x5-Lumbar fusion-retained hardware.(Dr. Louanne Skye)  . BREAST CYST EXCISION     bilateral breast, 3 cysts removed from each breast  . BREAST EXCISIONAL BIOPSY Right    x 3  . BREAST EXCISIONAL BIOPSY Left    x 3  . CARPAL TUNNEL RELEASE Bilateral   . Cervical biospy    . COLONOSCOPY WITH PROPOFOL N/A 07/06/2015   Procedure: COLONOSCOPY WITH PROPOFOL;  Surgeon: [name], MD;  Location: [facility];  Service: Endoscopy;  Laterality: N/A;  . DOPPLER ECHOCARDIOGRAPHY  04/06/2012   AT [place] 55-60%  . FRACTURE SURGERY    . I&D EXTREMITY Left 12/18/2016   Procedure: IRRIGATION AND DEBRIDEMENT LEFT FOOT, CLOSURE;  Surgeon: [name], MD;  Location: [place];  Service: Orthopedics;  Laterality: Left;  [name] Kitchen MULTIPLE TOOTH EXTRACTIONS    . NECK SURGERY  04-10-11   x3- cervical fusion with plating and screws-Dr. [name]  . TEE WITHOUT CARDIOVERSION N/A 04/06/2012   Procedure: TRANSESOPHAGEAL ECHOCARDIOGRAM (TEE);  Surgeon: [name], MD;  Location: [facility];  Service: Cardiovascular;  Laterality: N/A;  . TRIGGER FINGER RELEASE Right    middle finger  . TRIGGER FINGER RELEASE Left 06/28/2016   Procedure: RELEASE TRIGGER FINGER LEFT 3RD FINGER;  Surgeon: [name], MD;  Location: [place];  Service: Orthopedics;  Laterality: Left;  . TRIGGER FINGER RELEASE Right 06/12/2016   Procedure: RIGHT INDEX FINGER TRIGGER RELEASE;  Surgeon: [name], MD;  Location: [place];  Service: Orthopedics;  Laterality: Right;  . TRIGGER FINGER RELEASE Right 01/19/2018   Procedure: RIGHT RING FINGER TRIGGER FINGER RELEASE;  Surgeon: [name], MD;  Location: [place] [facility];  Service: Orthopedics;  Laterality: Right;    Current Medications: Outpatient Medications Prior to Visit  Medication Sig Dispense Refill  . ACCU-CHEK AVIVA PLUS test strip TEST 4 TIMES DAILY 100 each 12  . ACCU-CHEK SOFTCLIX  LANCETS lancets TEST 4 TIMES DAILY 200 each 6  . albuterol (PROAIR HFA) 108 (90 Base) MCG/ACT inhaler Inhale 2 puffs into the lungs every 6 (six) hours as needed for shortness of breath. 1 Inhaler 1  . allopurinol (ZYLOPRIM) 100 MG tablet Take 100 mg by mouth daily.    [name] Kitchen ammonium lactate (AMLACTIN) 12 % lotion Apply 1 application topically 2 (two) times daily as needed for dry skin. 400 g 0  . atorvastatin (LIPITOR) 40 MG tablet Take 1 tablet (40 mg total) by mouth daily. 90 tablet 3  . baclofen (LIORESAL) 10 MG tablet Take 2 tablets (20 mg total) by mouth 3 (three) times daily. 180 tablet 1  .  blood glucose meter kit and supplies KIT Dispense based on patient and insurance preference. Use up to four times daily as directed. (FOR ICD-9 250.00, 250.01). 1 each 0  . busPIRone (BUSPAR) 10 MG tablet Take 1 tablet (10 mg total) by mouth 3 (three) times daily. 90 tablet 11  . Capsaicin-Menthol (PAIN RELIEF EX) Apply 1 application topically 3 (three) times daily as needed (shoulder pain).    . citalopram (CELEXA) 40 MG tablet TAKE 1 TABLET BY MOUTH EVERY DAY (Patient taking differently: Take 40 mg by mouth every morning. ) 90 tablet 0  . diclofenac sodium (VOLTAREN) 1 % GEL APPLY 2 GRAMS TO AFFECTED AREA 4 TIMES A DAY (Patient taking differently: Apply 2 g topically 4 (four) times daily. ) 300 g 2  . diltiazem (CARDIZEM CD) 120 MG 24 hr capsule Take 1 capsule (120 mg total) by mouth 2 (two) times daily. 180 capsule 3  . docusate sodium (COLACE) 100 MG capsule Take 200 mg by mouth 2 (two) times daily.    . fluticasone (FLONASE) 50 MCG/ACT nasal spray Place 2 sprays into both nostrils 2 (two) times daily. 16 g 1  . Fluticasone-Umeclidin-Vilant (TRELEGY ELLIPTA) 100-62.5-25 MCG/INH AEPB Inhale 1 puff into the lungs daily. 1 each 3  . gabapentin (NEURONTIN) 300 MG capsule TAKE 2 CAPSULES (600 MG TOTAL) BY MOUTH 3 (THREE) TIMES DAILY. 540 capsule 2  . HYDROcodone-acetaminophen (NORCO) 7.5-325 MG tablet Take 1  tablet by mouth every 6 (six) hours as needed for moderate pain. 20 tablet 0  . hydrOXYzine (VISTARIL) 100 MG capsule Take 1 capsule (100 mg total) by mouth 3 (three) times daily as needed for itching. 30 capsule 0  . Insulin Glargine (LANTUS SOLOSTAR) 100 UNIT/ML Solostar Pen Inject 10 Units into the skin daily. 15 mL 3  . insulin lispro (HUMALOG KWIKPEN) 100 UNIT/ML KiwkPen Inject 0.03 mLs (3 Units total) into the skin 3 (three) times daily before meals. 15 mL 2  . Insulin Pen Needle (B-D UF III MINI PEN NEEDLES) 31G X 5 MM MISC CHECK SUGARS 4 TIMES A DAY BEFORE MEALS AND AT BEDTIME. 100 each 12  . JARDIANCE 25 MG TABS tablet TAKE 1 TABLET BY MOUTH EVERY DAY (Patient taking differently: Take 25 mg by mouth every morning. Give in the morning with or without food.) 90 tablet 0  . ketoconazole (NIZORAL) 2 % cream Apply 1 fingertip amount to each foot daily. 30 g 0  . loratadine (CLARITIN) 10 MG tablet Take 1 tablet (10 mg total) by mouth daily. 90 tablet 0  . losartan (COZAAR) 100 MG tablet Take 1 tablet (100 mg total) by mouth daily. 90 tablet 3  . montelukast (SINGULAIR) 10 MG tablet Take 1 tablet (10 mg total) by mouth at bedtime. AS DIRECTED 90 tablet 3  . Multiple Vitamins-Minerals (MULTIVITAMIN WITH MINERALS) tablet Take 1 tablet by mouth daily.    . mupirocin ointment (BACTROBAN) 2 % Apply 1 application topically 2 (two) times daily. 22 g 0  . NARCAN 4 MG/0.1ML LIQD nasal spray kit     . NON FORMULARY Uses a C-PAP at bedtime    . nystatin (MYCOSTATIN) 100000 UNIT/ML suspension Take 5 mLs (500,000 Units total) by mouth 4 (four) times daily. Swish and swallow. (Patient taking differently: Take 5 mLs by mouth daily as needed (mouth sores). Swish and swallow.) 60 mL 3  . omeprazole (PRILOSEC) 20 MG capsule Take 1 capsule (20 mg total) by mouth daily as needed. 90 capsule 3  . oxybutynin (  DITROPAN XL) 10 MG 24 hr tablet Take 1 tablet (10 mg total) by mouth at bedtime. 30 tablet 0  . OXYGEN Inhale 2  L into the lungs continuous.    . promethazine (PHENERGAN) 25 MG tablet Take 1 tablet (25 mg total) by mouth every 6 (six) hours as needed for nausea. 30 tablet 1  . Vitamin D, Ergocalciferol, (DRISDOL) 50000 units CAPS capsule Take 50,000 Units by mouth every 30 (thirty) days.     . furosemide (LASIX) 40 MG tablet Take 2 tablets (80 mg total) by mouth 2 (two) times daily. 60 tablet 0  . metolazone (ZAROXOLYN) 2.5 MG tablet Take 1 tablet twice weekly on Mondays and Thursdays ONLY 30 tablet 0  . potassium chloride SA (KLOR-CON M20) 20 MEQ tablet Take 1 tablet twice weekly on Mondays and Thursdays ONLY 30 tablet 0  . HYDROcodone-acetaminophen (NORCO) 5-325 MG tablet Take 1 tablet by mouth daily as needed. (Patient not taking: Reported on 02/18/2018) 10 tablet 0   Facility-Administered Medications Prior to Visit  Medication Dose Route Frequency Provider Last Rate Last Dose  . technetium tetrofosmin (TC-MYOVIEW) injection 81.1 millicurie  91.4 millicurie Intravenous Once PRN Turner, Eber Hong, MD         Allergies:   Chantix [varenicline tartrate]; Corticosteroids; Methadone hcl; Aspirin; and Levofloxacin   Social History   Socioeconomic History  . Marital status: Divorced    Spouse name: Not on file  . Number of children: 1  . Years of education: 14  . Highest education level: 12th grade  Occupational History  . Not on file  Social Needs  . Financial resource strain: Not hard at all  . Food insecurity:    Worry: Never true    Inability: Never true  . Transportation needs:    Medical: No    Non-medical: No  Tobacco Use  . Smoking status: Former Smoker    Packs/day: 1.00    Years: 40.00    Pack years: 40.00    Types: Cigarettes    Start date: 01/28/1977    Last attempt to quit: 09/07/2017    Years since quitting: 0.4  . Smokeless tobacco: Never Used  . Tobacco comment: Quit prior to hospitalization using low dose Chantix  Substance and Sexual Activity  . Alcohol use: No     Alcohol/week: 0.0 standard drinks  . Drug use: Not Currently    Types: Marijuana, "Crack" cocaine    Comment: hx marijuana usemany years ago, Crack -none since 11/2015  . Sexual activity: Not Currently    Partners: Male    Birth control/protection: Post-menopausal  Lifestyle  . Physical activity:    Days per week: 0 days    Minutes per session: 0 min  . Stress: Not at all  Relationships  . Social connections:    Talks on phone: More than three times a week    Gets together: Twice a week    Attends religious service: 1 to 4 times per year    Active member of club or organization: No    Attends meetings of clubs or organizations: Never    Relationship status: Divorced  Other Topics Concern  . Not on file  Social History Narrative   Current Social History       Who lives at home: Patient lives alone in one level home; has 4 steps onto porch with a handrail. Has smoke alarms, no throw rugs. Has elevated toilet. Has shower bench in tub with grab rails.  Transportation: Patient has own vehicle and drives herself    Important Relationships "Family and friends"    Pets: None    Likes to eat varied diet. Seafood, fish, roasted [place] breast, vegetables and salads and fruits.   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 I'm a lovable person."                                                                                                     Family History:  The patient's family history includes Alcohol abuse in her father; Arthritis in her sister; Asthma in her sister and another family member; Breast cancer in her maternal aunt; COPD in an other family member; Cancer in her maternal aunt; Depression in her sister; Diabetes in an other  family member; Hypertension in an other family member; Pneumonia in her father; Pulmonary embolism in her mother; Sickle cell anemia in her son; Stroke in her father.   ROS:   Please see the history of present illness.    ROS All other systems reviewed and are negative.   PHYSICAL EXAM:   VS:  BP 122/80   Pulse 89   Ht '5\' 10"'  (1.778 m)   Wt (!) 415 lb (188.2 kg)   LMP 04/10/2006   SpO2 90%   BMI 59.55 kg/m    GEN: Well nourished, well developed, in no acute distress  HEENT: normal  Neck: no JVD, carotid bruits, or masses Cardiac: RRR; no murmurs, rubs, or gallops,no edema  Respiratory:  clear to auscultation bilaterally, normal work of breathing GI: soft, nontender, nondistended, + BS MS: no deformity or atrophy  Skin: warm and dry, no rash Neuro:  Alert and Oriented x 3, Strength and sensation are intact Psych: euthymic mood, full affect  Wt Readings from Last 3 Encounters:  02/18/18 (!) 415 lb (188.2 kg)  02/13/18 (!) 406 lb (184.2 kg)  02/12/18 (!) 405 lb (183.7 kg)      Studies/Labs Reviewed:   EKG:  EKG is not ordered today.  Recent Labs: 10/01/2017: TSH 0.419 10/07/2017: Magnesium 1.9 01/15/2018: ALT 28 01/27/2018: B Natriuretic Peptide 36.9; Hemoglobin 14.6; Platelets 344 02/16/2018: BUN 35; Creatinine, Ser 1.70; Potassium 4.3; Sodium 141   Lipid Panel    Component Value Date/Time   CHOL 145 10/27/2017 0944   TRIG 98 10/27/2017 0944   HDL 55 10/27/2017 0944   CHOLHDL 2.6 10/27/2017 0944   CHOLHDL 4.0 04/17/2015 0917   VLDL 34 (H) 04/17/2015 0917   LDLCALC 70 10/27/2017 0944   LDLDIRECT 114 (H) 09/15/2012 1559    Additional studies/ records that were reviewed today include:   Echo 10/01/2017 LV EF: 55% -   60% Study Conclusions  - Left ventricle: The cavity size was normal. Wall  thickness was   normal. Systolic function was normal. The estimated ejection   fraction was in the range of 55% to 60%. Wall motion was normal;   there were no regional wall  motion abnormalities. Doppler   parameters are consistent with abnormal left ventricular   relaxation (grade 1 diastolic dysfunction).   Myoview 11/11/2017  Nuclear stress EF: 71%.  The left ventricular ejection fraction is hyperdynamic (>65%).  There was no ST segment deviation noted during stress.  There is a small defect of mild severity present in the apical septal location. The defect is non-reversible. No ischemia noted.  This is a low risk study.   ASSESSMENT:    1. Chronic systolic heart failure ([place])   2. Hypervolemia, unspecified hypervolemia type   3. Edema of left lower extremity   4. Controlled type 2 diabetes mellitus without complication, without long-term current use of insulin ([place])   5. Morbid obesity ([place])   6. SVT (supraventricular tachycardia) ([place])   7. Essential hypertension   8. OSA on CPAP      PLAN:  In order of problems listed above:  1. Chronic diastolic heart failure: I think that twice weekly dosing of metolazone is too much for her.  I will change her back to once weekly dosing of metolazone.  She will need a basic metabolic panel in 2 to 3 weeks.  She is aware that if her weight is greater than 410 pounds, she can take a second dose of metolazone for the week  2. Hypertension: Blood pressure stable on current therapy  3. DM2: Managed by primary care provider  4. Obstructive sleep apnea: Continue CPAP  5. History of SVT: No significant recurrence  6. Morbid obesity: I encouraged continued weight loss.    Medication Adjustments/Labs and Tests Ordered: Current medicines are reviewed at length with the patient today.  Concerns regarding medicines are outlined above.  Medication changes, Labs and Tests ordered today are listed in the Patient Instructions below. Patient Instructions  Medication Instructions:  Lasix refill submitted. Change Metolazone to 1 time weekly (every Monday), if your weight gets above 410 lbs you may take an extra  Metolazone on Thursday. Take Potassium with Metolazone every Monday, and take extra Potassium if you have to use extra Metolazone.   If you need a refill on your cardiac medications before your next appointment, please call your pharmacy.   Lab work: BMET in 2-3 weeks If you have labs (blood work) drawn today and your tests are completely normal, you will receive your results only by: [name] Kitchen MyChart Message (if you have MyChart) OR . A paper copy in the mail If you have any lab test that is abnormal or we need to change your treatment, we will call you to review the results.   Follow-Up: At [facility], you and your health needs are our priority.  As part of our continuing mission to provide you with exceptional heart care, we have created designated Provider Care Teams.  These Care Teams include your primary Cardiologist (physician) and Advanced Practice Providers (APPs -  Physician Assistants and Nurse Practitioners) who all work together to provide you with the care you need, when you need it. [name] Kitchen Keep follow up with Dr.Christopher as scheduled.          Hilbert Corrigan, [place]  02/20/2018 11:51 PM    [facility] [address], [name], [place]  [address] Phone: [phone]; Fax: [phone]

## 2018-02-19 ENCOUNTER — Telehealth: Payer: Self-pay | Admitting: Cardiology

## 2018-02-19 NOTE — Telephone Encounter (Signed)
New Message:    Patient nurse from home health patient has gain 8.6 pound in two days. Patient saw dr. Wilburn Mylar and had a refill on medication. Wanted to report this.

## 2018-02-19 NOTE — Telephone Encounter (Signed)
Spoke with pt who states yesterday she was 413.8 and today she is 416.6. Pt denies any SOB and states she only has some swelling. Pt states she hasn't taken her lasix since last Friday due to running out. She was seen in the office yesterday 1/22 by Billey Chang, PA. Pt was sent a refill for lasix and instructed to take her metolazone once a week(monday) if weight 410 or less and ok to take an extra metolazone on Thursday if weight is greater than 410. Pt states today was the first day she took her lasix and also took a dose of her metolazone. Pt report she is urinating a lot and feels her weight will decrease by tomorrow. Pt advised to continue monitoring her weight and contact office if she doesn't notice a decrease or become symptomatic. Pt verbalized understanding.

## 2018-02-20 ENCOUNTER — Encounter: Payer: Self-pay | Admitting: Physician Assistant

## 2018-02-20 DIAGNOSIS — R0789 Other chest pain: Secondary | ICD-10-CM | POA: Insufficient documentation

## 2018-02-20 NOTE — Progress Notes (Signed)
I reviewed with her this  lab during recent office visit

## 2018-02-20 NOTE — Progress Notes (Signed)
I have called her and explained the lab result

## 2018-02-20 NOTE — Progress Notes (Signed)
Patient wanted her lab results again she stated that she forgot and had a lot on her mind on the day of her appointment. Could you please call patient.

## 2018-02-23 ENCOUNTER — Other Ambulatory Visit: Payer: Self-pay

## 2018-02-23 ENCOUNTER — Telehealth: Payer: Self-pay | Admitting: Cardiology

## 2018-02-23 ENCOUNTER — Ambulatory Visit (INDEPENDENT_AMBULATORY_CARE_PROVIDER_SITE_OTHER): Payer: Medicare Other | Admitting: Student in an Organized Health Care Education/Training Program

## 2018-02-23 VITALS — BP 130/70 | HR 80 | Temp 98.4°F | Wt >= 6400 oz

## 2018-02-23 DIAGNOSIS — R252 Cramp and spasm: Secondary | ICD-10-CM

## 2018-02-23 NOTE — Progress Notes (Signed)
   CC:   HPI: [name] is a 53 y.o. female with PMH:  Patient Active Problem List   Diagnosis Date Noted  . Muscle cramping 02/25/2018  . Chest pressure 02/20/2018  . Nodule of upper lobe of right lung 02/13/2018  . Falls, subsequent encounter 01/30/2018  . Open wound of left foot 11/04/2017  . Essential hypertension 10/27/2017  . Solitary pulmonary nodule 10/07/2017  . Acute on chronic right-sided congestive heart failure ([place])   . Restrictive lung disease secondary to obesity 10/01/2017  . Polycythemia, secondary 10/01/2017  . Chronic viral hepatitis B without delta-agent ([place]) 07/08/2017  . COPD (chronic obstructive pulmonary disease) with chronic bronchitis ([place]) 06/27/2017  . Chronic kidney disease (CKD), stage IV (severe) ([place]) 01/14/2017  . Type 2 diabetes mellitus with stage 3 chronic kidney disease, with long-term current use of insulin ([place]) 12/12/2016  . Urge incontinence of urine 12/05/2016  . Trigger finger, right ring finger 07/15/2016  . Class 3 severe obesity due to excess calories with serious comorbidity and body mass index (BMI) of 50.0 to 59.9 in adult ([place]) 10/02/2012  . Chronic diastolic heart failure ([place]) 04/07/2012  . GERD 01/26/2010  . Hypertension 08/04/2008  . Obstructive sleep apnea treated with BiPAP 02/03/2008  . FIBROCYSTIC BREAST DISEASE 10/28/2006  . Hyperlipidemia 03/27/2006  . Obesity hypoventilation syndrome ([place]) 03/27/2006  . Former tobacco use 03/27/2006  . Depression with anxiety 03/27/2006   Patient presents for cramping for the past 4 days in her arms and legs. Cramping wakes her at night. She has had similar symptoms in the past but not recently. No N/V/D/C, no fevers, no urinary symptoms or respiratory symptoms.  Patient has been actively managed by heart failure for her fluid status. She has been managed with metolazone and lasix, and she has potassium on board. Her medications have been titrated and she is currently taking  metolazone once weekly, recently titrated down from twice weekly.  The patient appreciates that with changes in her fluid status she has worsened cramps.  Review of Symptoms:  See HPI for ROS.   CC, SH/smoking status, and VS noted.  Objective: BP 130/70   Pulse 80   Temp 98.4 F (36.9 C) (Oral)   Wt (!) 410 lb (186 kg)   LMP 04/10/2006   BMI 58.83 kg/m  GEN: NAD, alert, cooperative, and pleasant. RESPIRATORY: clear to auscultation bilaterally with no wheezes, rhonchi or rales, good effort, distant lung sounds due to body habitus CV: RRR, no m/r/g, no peripheral edema, distant heart sounds due to body habitus GI: soft, non-tender, non-distended, no hepatosplenomegaly SKIN: warm and dry, no rashes or lesions NEURO: II-XII grossly intact, normal gait, peripheral sensation intact PSYCH: AAOx3, appropriate affect  Assessment and plan:  Muscle cramping May be secondary to recent lasix use and fluid shifts as the patient suggested. She had a BMP with normal potassium earlier this week, however symptoms began after that BMP was colelcted -recheck BMP - continue Multivitamin - hold off adding a medication for cramping (patient has severe polypharmacy already)   Orders Placed This Encounter  Procedures  . Comprehensive metabolic panel    Order Specific Question:   Has the patient fasted?    Answer:   No    No orders of the defined types were placed in this encounter.    [name], MD,MS,  PGY3 02/25/2018 9:15 PM

## 2018-02-23 NOTE — Telephone Encounter (Signed)
Please see message below from [facility]. Thanks!

## 2018-02-23 NOTE — Telephone Encounter (Signed)
Attempt to contact [name] from [facility]. Left message to call back.   Called pt who states her weight has been fluctuating. She report starting 1/23 she was 416.6 and took her metolazone as instructed with extra potassium. She states the following day 1/24 she was 408.6 and that night she didn't elevate her legs like normal and felt the next day she had retained fluid. Her weight 1/25 414.6. She report the next night she started experiencing charlie horse in her legs and muscle ache all over. Her weight that morning 1/26 wad 409.6. Today she reports her weight was 407.6 and took her metolazone. She was instructed by [facility] nurse to go to pcp has she has had a 6.6 weight loss in 2 days. Pt states her pcp drew labs and are awaiting results. Will route to MD to update of pt's status.

## 2018-02-23 NOTE — Patient Instructions (Signed)
It was a pleasure seeing you today in our clinic. Here is the treatment plan we have discussed and agreed upon together:  We drew blood work at today's visit. I will call or send you a letter with these results. If you do not hear from me within the next week, please give our office a call.  Our clinic's number is [phone]. Please call with questions or concerns about what we discussed today.  Be well, Dr. Burr Medico

## 2018-02-23 NOTE — Telephone Encounter (Signed)
New Message        Caren Griffins calling from [facility], to discuss patient weight lost patient has lost  6.4 pounds and patient has also experienced some charley horses in her muscle  as well. Caren Griffins will fax information over,

## 2018-02-23 NOTE — Progress Notes (Signed)
  Subjective:    Patient ID: [name], female    DOB: 23-Jul-1965, 53 y.o.   MRN: [id number]   CC:  HPI:   Smoking status reviewed  ROS: 10 point ROS is otherwise negative, except as mentioned in HPI  Patient Active Problem List   Diagnosis Date Noted  . Chest pressure 02/20/2018  . Nodule of upper lobe of right lung 02/13/2018  . Falls, subsequent encounter 01/30/2018  . Open wound of left foot 11/04/2017  . Essential hypertension 10/27/2017  . Solitary pulmonary nodule 10/07/2017  . Acute on chronic right-sided congestive heart failure ([place])   . Restrictive lung disease secondary to obesity 10/01/2017  . Polycythemia, secondary 10/01/2017  . Chronic viral hepatitis B without delta-agent ([place]) 07/08/2017  . COPD (chronic obstructive pulmonary disease) with chronic bronchitis ([place]) 06/27/2017  . Chronic kidney disease (CKD), stage IV (severe) ([place]) 01/14/2017  . Type 2 diabetes mellitus with stage 3 chronic kidney disease, with long-term current use of insulin ([place]) 12/12/2016  . Urge incontinence of urine 12/05/2016  . Trigger finger, right ring finger 07/15/2016  . Class 3 severe obesity due to excess calories with serious comorbidity and body mass index (BMI) of 50.0 to 59.9 in adult ([place]) 10/02/2012  . Chronic diastolic heart failure ([place]) 04/07/2012  . GERD 01/26/2010  . Hypertension 08/04/2008  . Obstructive sleep apnea treated with BiPAP 02/03/2008  . FIBROCYSTIC BREAST DISEASE 10/28/2006  . Hyperlipidemia 03/27/2006  . Obesity hypoventilation syndrome ([place]) 03/27/2006  . Former tobacco use 03/27/2006  . Depression with anxiety 03/27/2006     Objective:  BP 130/70   Pulse 80   Temp 98.4 F (36.9 C) (Oral)   Wt (!) 410 lb (186 kg)   LMP 04/10/2006   BMI 58.83 kg/m  Vitals and nursing note reviewed  General: NAD, pleasant Cardiac: RRR, normal heart sounds, no murmurs Respiratory: CTAB, normal effort Abdomen: soft, nontender, nondistended Extremities:  no edema or cyanosis. WWP. Skin: warm and dry, no rashes noted Neuro: alert and oriented, no focal deficits Psych: normal affect  Assessment & Plan:    No problem-specific Assessment & Plan notes found for this encounter.    [place] Sergey Ishler, DO Family Medicine Resident PGY-2

## 2018-02-24 ENCOUNTER — Other Ambulatory Visit: Payer: Self-pay | Admitting: Student in an Organized Health Care Education/Training Program

## 2018-02-24 ENCOUNTER — Ambulatory Visit: Payer: Medicare Other | Admitting: Cardiology

## 2018-02-24 ENCOUNTER — Other Ambulatory Visit: Payer: Self-pay | Admitting: Physician Assistant

## 2018-02-24 DIAGNOSIS — H25013 Cortical age-related cataract, bilateral: Secondary | ICD-10-CM | POA: Diagnosis not present

## 2018-02-24 DIAGNOSIS — E119 Type 2 diabetes mellitus without complications: Secondary | ICD-10-CM | POA: Diagnosis not present

## 2018-02-24 DIAGNOSIS — H2513 Age-related nuclear cataract, bilateral: Secondary | ICD-10-CM | POA: Diagnosis not present

## 2018-02-24 DIAGNOSIS — Z794 Long term (current) use of insulin: Secondary | ICD-10-CM | POA: Diagnosis not present

## 2018-02-24 LAB — COMPREHENSIVE METABOLIC PANEL
ALT: 26 IU/L (ref 0–32)
AST: 23 IU/L (ref 0–40)
Albumin/Globulin Ratio: 1.5 (ref 1.2–2.2)
Albumin: 4.3 g/dL (ref 3.8–4.9)
Alkaline Phosphatase: 102 IU/L (ref 39–117)
BUN/Creatinine Ratio: 23 (ref 9–23)
BUN: 42 mg/dL — AB (ref 6–24)
Bilirubin Total: 0.2 mg/dL (ref 0.0–1.2)
CO2: 26 mmol/L (ref 20–29)
Calcium: 10 mg/dL (ref 8.7–10.2)
Chloride: 97 mmol/L (ref 96–106)
Creatinine, Ser: 1.83 mg/dL — ABNORMAL HIGH (ref 0.57–1.00)
GFR calc non Af Amer: 31 mL/min/{1.73_m2} — ABNORMAL LOW (ref >59)
GFR, EST AFRICAN AMERICAN: 36 mL/min/{1.73_m2} — AB (ref >59)
Globulin, Total: 2.8 g/dL (ref 1.5–4.5)
Glucose: 102 mg/dL — ABNORMAL HIGH (ref 65–99)
Potassium: 4 mmol/L (ref 3.5–5.2)
Sodium: 143 mmol/L (ref 134–144)
Total Protein: 7.1 g/dL (ref 6.0–8.5)

## 2018-02-24 LAB — HM DIABETES EYE EXAM

## 2018-02-24 NOTE — Telephone Encounter (Signed)
Attempted to contact pt to inform her Dr. Harrell Gave reviewed lab work order by pcp. Per MD, her potassium is not the cause of muscle cramps as her lab work doesn't demonstrate hypokalemia. Pt is to continue with medication regimen and follow up with pcp about symptoms.   Unable to leave message as pt's voicemail is not set up.

## 2018-02-24 NOTE — Telephone Encounter (Signed)
Pt updated with Dr. Judeth Cornfield recommendations. Pt verbalized understanding.

## 2018-02-25 ENCOUNTER — Ambulatory Visit: Payer: Medicare Other | Admitting: Physician Assistant

## 2018-02-25 ENCOUNTER — Encounter: Payer: Self-pay | Admitting: Student in an Organized Health Care Education/Training Program

## 2018-02-25 DIAGNOSIS — R252 Cramp and spasm: Secondary | ICD-10-CM | POA: Insufficient documentation

## 2018-02-25 NOTE — Assessment & Plan Note (Signed)
May be secondary to recent lasix use and fluid shifts as the patient suggested. She had a BMP with normal potassium earlier this week, however symptoms began after that BMP was colelcted -recheck BMP - continue Multivitamin - hold off adding a medication for cramping (patient has severe polypharmacy already)

## 2018-02-26 ENCOUNTER — Other Ambulatory Visit: Payer: Self-pay | Admitting: Physician Assistant

## 2018-02-26 NOTE — Telephone Encounter (Signed)
Rx request sent to pharmacy.  

## 2018-02-28 ENCOUNTER — Other Ambulatory Visit: Payer: Self-pay | Admitting: Student in an Organized Health Care Education/Training Program

## 2018-03-02 DIAGNOSIS — M5136 Other intervertebral disc degeneration, lumbar region: Secondary | ICD-10-CM | POA: Diagnosis not present

## 2018-03-02 DIAGNOSIS — Z79899 Other long term (current) drug therapy: Secondary | ICD-10-CM | POA: Diagnosis not present

## 2018-03-02 DIAGNOSIS — G894 Chronic pain syndrome: Secondary | ICD-10-CM | POA: Diagnosis not present

## 2018-03-02 DIAGNOSIS — M48061 Spinal stenosis, lumbar region without neurogenic claudication: Secondary | ICD-10-CM | POA: Diagnosis not present

## 2018-03-03 ENCOUNTER — Ambulatory Visit (INDEPENDENT_AMBULATORY_CARE_PROVIDER_SITE_OTHER): Payer: Medicare Other | Admitting: Orthopaedic Surgery

## 2018-03-03 ENCOUNTER — Telehealth: Payer: Self-pay | Admitting: Student in an Organized Health Care Education/Training Program

## 2018-03-03 ENCOUNTER — Other Ambulatory Visit: Payer: Self-pay | Admitting: Family Medicine

## 2018-03-03 DIAGNOSIS — J449 Chronic obstructive pulmonary disease, unspecified: Secondary | ICD-10-CM

## 2018-03-03 NOTE — Telephone Encounter (Signed)
Medical alert for [employer] form dropped off for at front desk for completion.  Verified that patient section of form has been completed.  Last DOS/WCC with PCP was 02/23/18.  Placed form in team folder to be completed by clinical staff.  Crista Luria

## 2018-03-03 NOTE — Telephone Encounter (Signed)
Clinical info completed on [employer] form.  Place form in Dr. Ernestina Penna box for completion.  Katharina Caper, [name], [place]

## 2018-03-04 ENCOUNTER — Other Ambulatory Visit: Payer: Self-pay | Admitting: Family Medicine

## 2018-03-06 ENCOUNTER — Ambulatory Visit (INDEPENDENT_AMBULATORY_CARE_PROVIDER_SITE_OTHER): Payer: Medicare Other | Admitting: Student in an Organized Health Care Education/Training Program

## 2018-03-06 ENCOUNTER — Ambulatory Visit (INDEPENDENT_AMBULATORY_CARE_PROVIDER_SITE_OTHER): Payer: Medicare Other | Admitting: Orthopaedic Surgery

## 2018-03-06 ENCOUNTER — Ambulatory Visit (HOSPITAL_COMMUNITY)
Admission: RE | Admit: 2018-03-06 | Discharge: 2018-03-06 | Disposition: A | Discharge status: Home / Self Care | Payer: Medicare Other | Source: Ambulatory Visit | Attending: Family Medicine | Admitting: Family Medicine

## 2018-03-06 ENCOUNTER — Encounter (INDEPENDENT_AMBULATORY_CARE_PROVIDER_SITE_OTHER): Payer: Self-pay

## 2018-03-06 ENCOUNTER — Other Ambulatory Visit: Payer: Self-pay

## 2018-03-06 VITALS — BP 102/60 | HR 78 | Temp 98.0°F | Ht 70.0 in | Wt >= 6400 oz

## 2018-03-06 DIAGNOSIS — R42 Dizziness and giddiness: Secondary | ICD-10-CM | POA: Insufficient documentation

## 2018-03-06 DIAGNOSIS — Z794 Long term (current) use of insulin: Secondary | ICD-10-CM

## 2018-03-06 DIAGNOSIS — N183 Chronic kidney disease, stage 3 unspecified: Secondary | ICD-10-CM

## 2018-03-06 DIAGNOSIS — J9621 Acute and chronic respiratory failure with hypoxia: Secondary | ICD-10-CM | POA: Diagnosis not present

## 2018-03-06 DIAGNOSIS — E1122 Type 2 diabetes mellitus with diabetic chronic kidney disease: Secondary | ICD-10-CM | POA: Diagnosis not present

## 2018-03-06 LAB — POCT GLYCOSYLATED HEMOGLOBIN (HGB A1C): HbA1c, POC (controlled diabetic range): 8.6 % — AB (ref 0.0–7.0)

## 2018-03-06 NOTE — Telephone Encounter (Signed)
Placed in to be faxed pile.   Copy made for batch scanning.  Fleeger, Salome Spotted, CMA

## 2018-03-06 NOTE — Progress Notes (Signed)
CC: presyncope  HPI: [name] is a 53 y.o. female with PMH: Patient Active Problem List   Diagnosis Date Noted  . Muscle cramping 02/25/2018  . Chest pressure 02/20/2018  . Nodule of upper lobe of right lung 02/13/2018  . Falls, subsequent encounter 01/30/2018  . Open wound of left foot 11/04/2017  . Essential hypertension 10/27/2017  . Solitary pulmonary nodule 10/07/2017  . Acute on chronic right-sided congestive heart failure ([place])   . Restrictive lung disease secondary to obesity 10/01/2017  . Polycythemia, secondary 10/01/2017  . Chronic viral hepatitis B without delta-agent ([place]) 07/08/2017  . COPD (chronic obstructive pulmonary disease) with chronic bronchitis ([place]) 06/27/2017  . Chronic kidney disease (CKD), stage IV (severe) ([place]) 01/14/2017  . Type 2 diabetes mellitus with stage 3 chronic kidney disease, with long-term current use of insulin ([place]) 12/12/2016  . Urge incontinence of urine 12/05/2016  . Trigger finger, right ring finger 07/15/2016  . Class 3 severe obesity due to excess calories with serious comorbidity and body mass index (BMI) of 50.0 to 59.9 in adult ([place]) 10/02/2012  . Chronic diastolic heart failure ([place]) 04/07/2012  . GERD 01/26/2010  . Hypertension 08/04/2008  . Obstructive sleep apnea treated with BiPAP 02/03/2008  . FIBROCYSTIC BREAST DISEASE 10/28/2006  . Hyperlipidemia 03/27/2006  . Obesity hypoventilation syndrome ([place]) 03/27/2006  . Former tobacco use 03/27/2006  . Depression with anxiety 03/27/2006    Patient presents today with episodic dizziness and wooziness. She reports that she has about 10 episodes per day in which she suddenly feels woozy for about 5 seconds, and then the symptom resolves. Sometimes this occurs at rest. Sometimes this occurs when she is walking and she has to lean against the wall. Sometimes it is associated with her usual episodic dyspnea when she is walking more than a few steps. She has not fallen. She has  not lost consciousness.    She indicates that she did not tolerate previous attempt at a loop monitor due to skin irritation.  Patient has an "episode" while sitting in the exam room. She sits, appears comfortable, and closes her eyes for about 10 seconds. The episode passes and she behaves normally. Her pulse is palpable and feels regular rate and rhythm during the episode.   Review of Symptoms:  See HPI for ROS.   CC, SH/smoking status, and VS noted.  Objective: BP 102/60   Pulse 78   Temp 98 F (36.7 C) (Oral)   Ht 5\' 10"  (1.778 m)   Wt (!) 188.4 kg   LMP 04/10/2006   SpO2 94% Comment: @2L   BMI 59.60 kg/m  GEN: Obese female, oxygen by [place], NAD, alert, cooperative, and pleasant EYE: PERRL, EOMI NECK: full ROM, no thyromegaly RESPIRATORY: clear to auscultation bilaterally with no wheezes, rhonchi or rales, good effort CV: RRR, no m/r/g, no peripheral edema GI: soft, non-tender, non-distended, no hepatosplenomegaly SKIN: warm and dry, no rashes or lesions NEURO: II-XII grossly intact, normal gait, peripheral sensation intact PSYCH: AAOx3, appropriate affect  Assessment and plan:  1. Presyncope - uncertain etiology, likely multifactorial including anxiety. Witnessed an episode in the office today as noted above. Considering cardiac etiology. EKG in the office today notable for prolonged QTc but no other abnormalities. Patient had recent BMP and CBC by her pain management doctor which were reviewed today and unremarkable. Hgb was elevated at 16 on those labs. - TSH today - patient will follow up with cardiology - patient declines speaking with behavioral health today -  BMP ordered by one of her specialists, patient was going to go across to [facility] to have this completed but asked if we can do it in our office while she is here so that was completed today  Orders Placed This Encounter  Procedures  . TSH  . Basic Metabolic Panel  . POCT glycosylated hemoglobin (Hb A1C)  .  EKG 12-Lead    No orders of the defined types were placed in this encounter.  [name], MD,MS,  PGY3 03/08/2018 12:25 PM

## 2018-03-06 NOTE — Telephone Encounter (Signed)
Completed and placed in nurse box.

## 2018-03-06 NOTE — Patient Instructions (Signed)
It was a pleasure seeing you today in our clinic.  We drew blood work at today's visit. I will call or send you a letter with these results. If you do not hear from me within the next week, please give our office a call.  Our clinic's number is [phone]. Please call with questions or concerns about what we discussed today.  Be well, Dr. Burr Medico

## 2018-03-07 LAB — BASIC METABOLIC PANEL
BUN/Creatinine Ratio: 23 (ref 9–23)
BUN: 36 mg/dL — ABNORMAL HIGH (ref 6–24)
CO2: 26 mmol/L (ref 20–29)
Calcium: 10 mg/dL (ref 8.7–10.2)
Chloride: 99 mmol/L (ref 96–106)
Creatinine, Ser: 1.58 mg/dL — ABNORMAL HIGH (ref 0.57–1.00)
GFR calc Af Amer: 42 mL/min/{1.73_m2} — ABNORMAL LOW (ref >59)
GFR calc non Af Amer: 37 mL/min/{1.73_m2} — ABNORMAL LOW (ref >59)
Glucose: 175 mg/dL — ABNORMAL HIGH (ref 65–99)
POTASSIUM: 4.2 mmol/L (ref 3.5–5.2)
Sodium: 145 mmol/L — ABNORMAL HIGH (ref 134–144)

## 2018-03-07 LAB — TSH: TSH: 0.469 u[IU]/mL (ref 0.450–4.500)

## 2018-03-08 ENCOUNTER — Encounter: Payer: Self-pay | Admitting: Student in an Organized Health Care Education/Training Program

## 2018-03-08 DIAGNOSIS — G4733 Obstructive sleep apnea (adult) (pediatric): Secondary | ICD-10-CM | POA: Diagnosis not present

## 2018-03-08 DIAGNOSIS — M5090 Cervical disc disorder, unspecified, unspecified cervical region: Secondary | ICD-10-CM | POA: Diagnosis not present

## 2018-03-08 DIAGNOSIS — J9621 Acute and chronic respiratory failure with hypoxia: Secondary | ICD-10-CM | POA: Diagnosis not present

## 2018-03-09 ENCOUNTER — Ambulatory Visit: Payer: Medicare Other | Admitting: Student in an Organized Health Care Education/Training Program

## 2018-03-09 ENCOUNTER — Other Ambulatory Visit (INDEPENDENT_AMBULATORY_CARE_PROVIDER_SITE_OTHER): Payer: Self-pay | Admitting: Specialist

## 2018-03-09 NOTE — Telephone Encounter (Signed)
Dicolfenac Gel 1% refill request

## 2018-03-10 ENCOUNTER — Other Ambulatory Visit: Payer: Self-pay | Admitting: Student in an Organized Health Care Education/Training Program

## 2018-03-10 ENCOUNTER — Encounter (INDEPENDENT_AMBULATORY_CARE_PROVIDER_SITE_OTHER): Payer: Self-pay | Admitting: Orthopaedic Surgery

## 2018-03-10 ENCOUNTER — Telehealth: Payer: Self-pay | Admitting: Student in an Organized Health Care Education/Training Program

## 2018-03-10 ENCOUNTER — Ambulatory Visit (INDEPENDENT_AMBULATORY_CARE_PROVIDER_SITE_OTHER): Payer: Medicare Other | Admitting: Orthopaedic Surgery

## 2018-03-10 DIAGNOSIS — J984 Other disorders of lung: Secondary | ICD-10-CM

## 2018-03-10 DIAGNOSIS — E669 Obesity, unspecified: Secondary | ICD-10-CM

## 2018-03-10 DIAGNOSIS — M65341 Trigger finger, right ring finger: Secondary | ICD-10-CM

## 2018-03-10 NOTE — Telephone Encounter (Signed)
Called and spoke with patient regarding refill request for baclofen. She is taking a lot of this medication for muscle spasms. She takes the medicine TID. Advised her to please titrate down this medicine gradually to once per day and we can discuss it further at her next visit.   She asks for a walker with a seat and basket for her oxygen tank. Will place DME order.

## 2018-03-10 NOTE — Progress Notes (Signed)
Post-Op Visit Note   Patient: [name]           Date of Birth: [birth year]           MRN: [id number] Visit Date: 03/10/2018 PCP: [name], MD   Assessment & Plan:  Visit Diagnoses:  1. Trigger finger, right ring finger     Plan: [name] is 6 weeks status post right ring trigger finger release.  She is overall doing well.  Her scar is slightly tender.  She reports no persistent triggering.  She has full range of motion of her ring finger.  At this point she is doing well and released to activity as tolerated with her right hand.  Questions encouraged and answered.  Follow-up as needed.  Follow-Up Instructions: Return if symptoms worsen or fail to improve.   Orders:  No orders of the defined types were placed in this encounter.  No orders of the defined types were placed in this encounter.   Imaging: No results found.  PMFS History: Patient Active Problem List   Diagnosis Date Noted  . Muscle cramping 02/25/2018  . Chest pressure 02/20/2018  . Nodule of upper lobe of right lung 02/13/2018  . Falls, subsequent encounter 01/30/2018  . Open wound of left foot 11/04/2017  . Essential hypertension 10/27/2017  . Solitary pulmonary nodule 10/07/2017  . Acute on chronic right-sided congestive heart failure ([place])   . Restrictive lung disease secondary to obesity 10/01/2017  . Polycythemia, secondary 10/01/2017  . Chronic viral hepatitis B without delta-agent ([place]) 07/08/2017  . COPD (chronic obstructive pulmonary disease) with chronic bronchitis ([place]) 06/27/2017  . Chronic kidney disease (CKD), stage IV (severe) ([place]) 01/14/2017  . Type 2 diabetes mellitus with stage 3 chronic kidney disease, with long-term current use of insulin ([place]) 12/12/2016  . Urge incontinence of urine 12/05/2016  . Trigger finger, right ring finger 07/15/2016  . Class 3 severe obesity due to excess calories with serious comorbidity and body mass index (BMI) of 50.0 to 59.9 in adult ([place])  10/02/2012  . Chronic diastolic heart failure ([place]) 04/07/2012  . GERD 01/26/2010  . Hypertension 08/04/2008  . Obstructive sleep apnea treated with BiPAP 02/03/2008  . FIBROCYSTIC BREAST DISEASE 10/28/2006  . Hyperlipidemia 03/27/2006  . Obesity hypoventilation syndrome ([place]) 03/27/2006  . Former tobacco use 03/27/2006  . Depression with anxiety 03/27/2006   Past Medical History:  Diagnosis Date  . Anxiety   . Arthritis 04-10-11   hips, shoulders, back  . Asthma   . Bipolar disorder ([place])   . Cancer ([place]) 1993   cervical, no treatment done, went away per pt  . Cervical dysplasia or atypia 04-10-11   '93- once dx.-got pregnant-no intervention, then postpartum, no dysplasia found  . CHF (congestive heart failure) ([place])    no cardiologist 2014 dx, none now  . Chronic kidney disease    stage 3 kidney disease  . 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 Pathology showed Condyloma, gluteal CONDYLOMA ACUMINATUM.   [name] Kitchen COPD (chronic obstructive pulmonary disease) ([place])   . Diabetes mellitus without complication ([place])   . Dyspnea    with actity, sitting  . Fall 12/16/2016  . Fibrocystic breast disease   . GERD (gastroesophageal reflux disease)   . Hepatitis    hep B-count is low at present,doesn't register  . Hypercalcemia   . Hyperlipidemia   . Hypertension   . Lung nodule 11/2017   right and left lung  . Migraine  headache    none recent  . Morbid obesity ([place]) 03/27/2006  . Neuropathy   . Pneumonia    walking pneu 15 yrs. ago  . Skin lesion 03/15/2011   In gluteal crease now s/p removal by Dr. [name] of General Surgery on 3/12. Path shows condyloma.     . Sleep apnea 04-10-11   uses cpap, pt does not know settings  . Sleep apnea   . Trigger finger    left third    Family History  Problem Relation Age of Onset  . Pulmonary embolism Mother   . Stroke Father   . Alcohol abuse Father   . Pneumonia Father   . Cancer Maternal Aunt        breast  .  Breast cancer Maternal Aunt   . Asthma Other   . COPD Other   . Hypertension Other   . Diabetes Other   . Asthma Sister   . Depression Sister   . Arthritis Sister   . Sickle cell anemia Son     Past Surgical History:  Procedure Laterality Date  . ANAL FISTULECTOMY  04/17/2011   Procedure: FISTULECTOMY ANAL;  Surgeon: Imogene Burn. [name], MD;  Location: [facility];  Service: General;  Laterality: N/A;  Excision of Condyloma Gluteal Cleft   . BACK SURGERY  04-10-11   x5-Lumbar fusion-retained hardware.(Dr. Louanne Skye)  . BREAST CYST EXCISION     bilateral breast, 3 cysts removed from each breast  . BREAST EXCISIONAL BIOPSY Right    x 3  . BREAST EXCISIONAL BIOPSY Left    x 3  . CARPAL TUNNEL RELEASE Bilateral   . Cervical biospy    . COLONOSCOPY WITH PROPOFOL N/A 07/06/2015   Procedure: COLONOSCOPY WITH PROPOFOL;  Surgeon: [name], MD;  Location: [facility]OS[facility];  Service: Endoscopy;  Laterality: N/A;  . DOPPLER ECHOCARDIOGRAPHY  04/06/2012   AT [place] 55-60%  . FRACTURE SURGERY    . I&D EXTREMITY Left 12/18/2016   Procedure: IRRIGATION AND DEBRIDEMENT LEFT FOOT, CLOSURE;  Surgeon: [name], MD;  Location: [facility];  Service: Orthopedics;  Laterality: Left;  [name] Kitchen MULTIPLE TOOTH EXTRACTIONS    . NECK SURGERY  04-10-11   x3- cervical fusion with plating and screws-Dr. [name]  . TEE WITHOUT CARDIOVERSION N/A 04/06/2012   Procedure: TRANSESOPHAGEAL ECHOCARDIOGRAM (TEE);  Surgeon: [name], MD;  Location: [facility] ENDOSCOPY;  Service: Cardiovascular;  Laterality: N/A;  . TRIGGER FINGER RELEASE Right    middle finger  . TRIGGER FINGER RELEASE Left 06/28/2016   Procedure: RELEASE TRIGGER FINGER LEFT 3RD FINGER;  Surgeon: [name], MD;  Location: [facility];  Service: Orthopedics;  Laterality: Left;  . TRIGGER FINGER RELEASE Right 06/12/2016   Procedure: RIGHT INDEX FINGER TRIGGER RELEASE;  Surgeon: [name], MD;  Location: [facility];  Service: Orthopedics;  Laterality: Right;  . TRIGGER  FINGER RELEASE Right 01/19/2018   Procedure: RIGHT RING FINGER TRIGGER FINGER RELEASE;  Surgeon: [name], MD;  Location: [facility];  Service: Orthopedics;  Laterality: Right;   Social History   Occupational History  . Not on file  Tobacco Use  . Smoking status: Former Smoker    Packs/day: 1.00    Years: 40.00    Pack years: 40.00    Types: Cigarettes    Start date: 01/28/1977    Last attempt to quit: 09/07/2017    Years since quitting: 0.5  . Smokeless tobacco: Never Used  . Tobacco comment: Quit prior to  hospitalization using low dose Chantix  Substance and Sexual Activity  . Alcohol use: No    Alcohol/week: 0.0 standard drinks  . Drug use: Not Currently    Types: Marijuana, "Crack" cocaine    Comment: hx marijuana usemany years ago, Crack -none since 11/2015  . Sexual activity: Not Currently    Partners: Male    Birth control/protection: Post-menopausal

## 2018-03-11 ENCOUNTER — Telehealth: Payer: Self-pay | Admitting: Podiatry

## 2018-03-11 NOTE — Telephone Encounter (Signed)
Pt called and states you told her you would waive the charges for the diabetic shoes for her and she just got a bill.  Did you want [place] billing to waive the charges?

## 2018-03-13 ENCOUNTER — Encounter: Payer: Self-pay | Admitting: Student in an Organized Health Care Education/Training Program

## 2018-03-14 DIAGNOSIS — G4733 Obstructive sleep apnea (adult) (pediatric): Secondary | ICD-10-CM | POA: Diagnosis not present

## 2018-03-17 ENCOUNTER — Encounter: Payer: Self-pay | Admitting: Dietician

## 2018-03-17 ENCOUNTER — Encounter: Payer: Medicare Other | Attending: General Surgery | Admitting: Dietician

## 2018-03-17 VITALS — Wt >= 6400 oz

## 2018-03-17 DIAGNOSIS — K219 Gastro-esophageal reflux disease without esophagitis: Secondary | ICD-10-CM | POA: Insufficient documentation

## 2018-03-17 DIAGNOSIS — E785 Hyperlipidemia, unspecified: Secondary | ICD-10-CM | POA: Diagnosis not present

## 2018-03-17 DIAGNOSIS — J449 Chronic obstructive pulmonary disease, unspecified: Secondary | ICD-10-CM | POA: Diagnosis not present

## 2018-03-17 DIAGNOSIS — Z713 Dietary counseling and surveillance: Secondary | ICD-10-CM | POA: Diagnosis not present

## 2018-03-17 DIAGNOSIS — Z6841 Body Mass Index (BMI) 40.0 and over, adult: Secondary | ICD-10-CM | POA: Insufficient documentation

## 2018-03-17 DIAGNOSIS — N184 Chronic kidney disease, stage 4 (severe): Secondary | ICD-10-CM | POA: Diagnosis not present

## 2018-03-17 DIAGNOSIS — E669 Obesity, unspecified: Secondary | ICD-10-CM | POA: Diagnosis not present

## 2018-03-17 DIAGNOSIS — I129 Hypertensive chronic kidney disease with stage 1 through stage 4 chronic kidney disease, or unspecified chronic kidney disease: Secondary | ICD-10-CM | POA: Diagnosis not present

## 2018-03-17 DIAGNOSIS — E1122 Type 2 diabetes mellitus with diabetic chronic kidney disease: Secondary | ICD-10-CM | POA: Diagnosis not present

## 2018-03-17 DIAGNOSIS — F418 Other specified anxiety disorders: Secondary | ICD-10-CM | POA: Diagnosis not present

## 2018-03-17 NOTE — Patient Instructions (Addendum)
   Download MyFitness Pal on your phone so you have it ready to track your food and water.   Continue to take your multivitamin and vitamin D supplements daily.

## 2018-03-17 NOTE — Progress Notes (Signed)
Bariatric Supervised Weight Loss Visit Appt Start Time: 7:35am  End Time: 7:50am  Planned Surgery: RYGB   2nd out of 5 SWL Appointments   Pt requires wheelchair for transportation to and from her vehicle.   NUTRITION ASSESSMENT  Anthropometrics  Start weight at [facility]: 405.6 lbs (01/15/2018) Today's weight: 410.7 lbs Weight change: +5.7 lbs since previous visit on 02/12/2018 BMI: 58.9 kg/m2    Clinical  Medications: see list  Supplements: MVI  Psychosocial/Lifestyle Pt lives alone, and her son performs the food shopping for her. Pt's ability to walk/move is restricted dt health conditions. Pt is talkative.  24-Hr Dietary Recall First Meal: oatmeal + banana + fruit Snack: grapes  Second Meal: chef salad (or smoked [place] wings + white rice + brussels sprouts) Snack: navel oranges Third Meal: smoked [place] wings + pinto beans  Snack: none  Beverages: water + kool-aid + green tea + Powerade Zero  Food & Nutrition Related Hx Dietary Hx: Pt states she usually fixes a lot of food at one time then eats it for several meals until it is gone. Pt states she has reduced the amount of sugar she consumes. Pt states she has not had tea in a long time. Pt states she is going to use organic sugar to sweeten things. Pt states she avoids dairy products, butter, olive oil, and heavy greasy foods to avoid heartburn. Pt states she plans to try skinny pop popcorn instead of regular popcorn. Pt states she went to the grocery store and used the electric scooter to get around.  Estimated Daily Fluid Intake: <2 L (on fluid restriction)  GI / Other Notable Symptoms: none   Physical Activity  Current average weekly physical activity: minimal chair exercises (legs, arms, hands)    Estimated Energy Needs Calories: 1600 Carbohydrate: 180g Protein: 120g Fat: 44g  Progress Towards Pre-Op Goals Previously Chosen . Pt has not downloaded MyFitness Pal.  . Making better food choices, focusing on keeping  grams of fat and sugar 9 or less per serving.   . Pt has found she likes Premier Protein shakes.  . Does not drink with meals.    NUTRITION DIAGNOSIS  Overweight/obesity ([place]-3.3) related to past poor dietary habits and physical inactivity as evidenced by patient w/ planned RYGB surgery following dietary guidelines for continued weight loss.    NUTRITION INTERVENTION  Nutrition counseling (C-1) and education (E-2) to facilitate bariatric surgery goals.  New Pre-Op Goals to Work On .  Track food and beverage intake   Handouts Provided Include   Vitamins & Minerals   Learning Style & Readiness for Change Teaching method utilized: Visual & Auditory  Demonstrated degree of understanding via: Teach Back  Barriers to learning/adherence to lifestyle change: multiple health conditions limit ability to be active   MONITORING & EVALUATION Dietary intake, weekly physical activity, body weight, and pre-op goals.   Next Steps  Notes for next SWL Visit:   Assess progress made on Pre-Op Goals discussed thus far.   Sugary drinks  Patient is to return to [facility] in 1 month for 3rd SWL Visit.

## 2018-03-18 ENCOUNTER — Telehealth: Payer: Self-pay | Admitting: Student in an Organized Health Care Education/Training Program

## 2018-03-18 NOTE — Telephone Encounter (Signed)
Called and spoke with patient regarding her baclofen. Had previously discussed weaning this medication and she agreed, however she subsequently messaged me because she was upset that I weaned the medicine despite our conversation. She is taking 20 mg TID but also having symptoms of dizzy spells. - Advised that we should switch to PRN dosing of the medicine rather than taking the muscle relaxer standing first thing in the morning. She is not agreeable. -  Discussed concern for her safety. She is upset that we are weaning the medication. She will come in for an appointment next week to discuss this further.

## 2018-03-19 ENCOUNTER — Ambulatory Visit (INDEPENDENT_AMBULATORY_CARE_PROVIDER_SITE_OTHER): Payer: Medicare Other | Admitting: Cardiology

## 2018-03-19 ENCOUNTER — Encounter: Payer: Self-pay | Admitting: Cardiology

## 2018-03-19 VITALS — BP 127/78 | HR 78 | Ht 70.0 in | Wt >= 6400 oz

## 2018-03-19 DIAGNOSIS — N183 Chronic kidney disease, stage 3 unspecified: Secondary | ICD-10-CM

## 2018-03-19 DIAGNOSIS — Z794 Long term (current) use of insulin: Secondary | ICD-10-CM

## 2018-03-19 DIAGNOSIS — I471 Supraventricular tachycardia: Secondary | ICD-10-CM

## 2018-03-19 DIAGNOSIS — Z6841 Body Mass Index (BMI) 40.0 and over, adult: Secondary | ICD-10-CM

## 2018-03-19 DIAGNOSIS — G4733 Obstructive sleep apnea (adult) (pediatric): Secondary | ICD-10-CM | POA: Diagnosis not present

## 2018-03-19 DIAGNOSIS — E1122 Type 2 diabetes mellitus with diabetic chronic kidney disease: Secondary | ICD-10-CM

## 2018-03-19 DIAGNOSIS — I5032 Chronic diastolic (congestive) heart failure: Secondary | ICD-10-CM | POA: Diagnosis not present

## 2018-03-19 DIAGNOSIS — E7800 Pure hypercholesterolemia, unspecified: Secondary | ICD-10-CM

## 2018-03-19 DIAGNOSIS — I10 Essential (primary) hypertension: Secondary | ICD-10-CM

## 2018-03-19 DIAGNOSIS — E662 Morbid (severe) obesity with alveolar hypoventilation: Secondary | ICD-10-CM

## 2018-03-19 NOTE — Patient Instructions (Signed)
Medication Instructions:  Take Metolazone 2.5 mg on Monday. On Thursday, if weight is greater than 410 lbs take 1 dose of Metolazone 2.5 mg. If you are consistently having to take an extra every Thursday, please call our office.  If you need a refill on your cardiac medications before your next appointment, please call your pharmacy.   Lab work: None   Testing/Procedures: None  Follow-Up: At [facility], you and your health needs are our priority.  As part of our continuing mission to provide you with exceptional heart care, we have created designated Provider Care Teams.  These Care Teams include your primary Cardiologist (physician) and Advanced Practice Providers (APPs -  Physician Assistants and Nurse Practitioners) who all work together to provide you with the care you need, when you need it. You will need a follow up appointment in 6-8 weeks.  Please call our office 2 months in advance to schedule this appointment.  You may see [name], MD or one of the following Advanced Practice Providers on your designated Care Team:   Rosaria Ferries, PA-C . Jory Sims, DNP, ANP

## 2018-03-19 NOTE — Progress Notes (Signed)
Cardiology Office Note:    Date:  2/50/0370   ID:  AMYA HLAD, DOB [birth year], MRN [id number]  PCP:  [name], MD  Cardiologist:  [name], MD PhD  Referring MD: [name], MD   CC: follow up  History of Present Illness:    [name] is a 53 y.o. female with a hx of morbid obesity (BMI 29), type II diabetes, heart failure with preserved ejection fraction, hypertension who is seen in follow up for the evaluation and management of shortness of breath, chronic diastolic heart failure.  Cardiac history: Initial consult was 10/27/17. She was admitted from 9/3-9/4 for acute on chronic diastolic heart failure. She was discharged on furosemide 40 mg BID and home oxygen of 2L when ambulating. She has sleep apnea and uses CPAP at home.  Her concerns at that time were severe shortness of breath associated with chest pain multiple times per day with even minimal exertion. We performed a lexiscan on 11/11/17 which was low risk, normal EF, no ST changes, possible apical septal infarct vs. Artifact, no ischemia. Prior visit, started diltiazem with some improvement. Has been seeing Almyra Deforest for management of volume status.  -Prior cardiac history: diastolic dysfunction on echo, clinical HFpEF with hospitalizations -Tobacco: quit 09/08/17. Was 1 ppd x 40 years.  -Comorbidities: morbid obesity, type II diabetes, hypertension -Exercise level: none. On constant home O2.  Today: feeling well today, brings a list of her weights and notes with her. Range has been from 417.6 at the peak to 403.8 at the bottom. She notes that when she has wide swings in her weights, it's a shock to her body. Wondering if she should take the fluid pills every other day. Notes that when her weight creeps up, her breathing is much worse, and she has severe cramps. Taking mustard 3 times/day to help with cramps.  Right now, taking 80 mg furosemide twice a day and taking metolazone 2.5 mg on Mondays. Feels  that when she creeps back up to 410, that's when she is getting read to go into crisis. Discussed plan that if she is 410 lbs or more on Thursday, she should take another dose of metolazone and potassium. If she routinely needs to take metolazone twice weekly, we will need to follow her renal function closely.   Denies chest pain, PND, orthopnea, syncope or worsening palpitations. She has occasional episodes of lightheadedness, but these do not occur with her palpitations. No clear triggers, sometimes at rest, sometimes with walking. Reviewed recent occurrence from Dr. Ernestina Penna note on 03/06/18.  Past Medical History:  Diagnosis Date  . Anxiety   . Arthritis 04-10-11   hips, shoulders, back  . Asthma   . Bipolar disorder ([place])   . Cancer ([place]) 1993   cervical, no treatment done, went away per pt  . Cervical dysplasia or atypia 04-10-11   '93- once dx.-got pregnant-no intervention, then postpartum, no dysplasia found  . CHF (congestive heart failure) ([place])    no cardiologist 2014 dx, none now  . Chronic kidney disease    stage 3 kidney disease  . 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 Pathology showed Condyloma, gluteal CONDYLOMA ACUMINATUM.   [name] Kitchen COPD (chronic obstructive pulmonary disease) ([place])   . Diabetes mellitus without complication ([place])   . Dyspnea    with actity, sitting  . Fall 12/16/2016  . Fibrocystic breast disease   . GERD (gastroesophageal reflux disease)   . Hepatitis  hep B-count is low at present,doesn't register  . Hypercalcemia   . Hyperlipidemia   . Hypertension   . Lung nodule 11/2017   right and left lung  . Migraine headache    none recent  . Morbid obesity ([place]) 03/27/2006  . Neuropathy   . Pneumonia    walking pneu 15 yrs. ago  . Skin lesion 03/15/2011   In gluteal crease now s/p removal by Dr. [name] of General Surgery on 3/12. Path shows condyloma.     . Sleep apnea 04-10-11   uses cpap, pt does not know settings  . Sleep apnea    . Trigger finger    left third    Past Surgical History:  Procedure Laterality Date  . ANAL FISTULECTOMY  04/17/2011   Procedure: FISTULECTOMY ANAL;  Surgeon: Imogene Burn. [name], MD;  Location: [facility];  Service: General;  Laterality: N/A;  Excision of Condyloma Gluteal Cleft   . BACK SURGERY  04-10-11   x5-Lumbar fusion-retained hardware.(Dr. Louanne Skye)  . BREAST CYST EXCISION     bilateral breast, 3 cysts removed from each breast  . BREAST EXCISIONAL BIOPSY Right    x 3  . BREAST EXCISIONAL BIOPSY Left    x 3  . CARPAL TUNNEL RELEASE Bilateral   . Cervical biospy    . COLONOSCOPY WITH PROPOFOL N/A 07/06/2015   Procedure: COLONOSCOPY WITH PROPOFOL;  Surgeon: [name], MD;  Location: [facility];  Service: Endoscopy;  Laterality: N/A;  . DOPPLER ECHOCARDIOGRAPHY  04/06/2012   AT [place] 55-60%  . FRACTURE SURGERY    . I&D EXTREMITY Left 12/18/2016   Procedure: IRRIGATION AND DEBRIDEMENT LEFT FOOT, CLOSURE;  Surgeon: [name], MD;  Location: [facility];  Service: Orthopedics;  Laterality: Left;  [name] Kitchen MULTIPLE TOOTH EXTRACTIONS    . NECK SURGERY  04-10-11   x3- cervical fusion with plating and screws-Dr. [name]  . TEE WITHOUT CARDIOVERSION N/A 04/06/2012   Procedure: TRANSESOPHAGEAL ECHOCARDIOGRAM (TEE);  Surgeon: [name], MD;  Location: [facility] ENDOSCOPY;  Service: Cardiovascular;  Laterality: N/A;  . TRIGGER FINGER RELEASE Right    middle finger  . TRIGGER FINGER RELEASE Left 06/28/2016   Procedure: RELEASE TRIGGER FINGER LEFT 3RD FINGER;  Surgeon: [name], MD;  Location: [facility];  Service: Orthopedics;  Laterality: Left;  . TRIGGER FINGER RELEASE Right 06/12/2016   Procedure: RIGHT INDEX FINGER TRIGGER RELEASE;  Surgeon: [name], MD;  Location: [facility];  Service: Orthopedics;  Laterality: Right;  . TRIGGER FINGER RELEASE Right 01/19/2018   Procedure: RIGHT RING FINGER TRIGGER FINGER RELEASE;  Surgeon: [name], MD;  Location: [place];  Service: Orthopedics;   Laterality: Right;    Current Medications: Current Outpatient Medications on File Prior to Visit  Medication Sig  . ACCU-CHEK AVIVA PLUS test strip TEST 4 TIMES DAILY  . ACCU-CHEK SOFTCLIX LANCETS lancets TEST 4 TIMES DAILY  . allopurinol (ZYLOPRIM) 100 MG tablet Take 100 mg by mouth daily.  [name] Kitchen ammonium lactate (AMLACTIN) 12 % lotion Apply 1 application topically 2 (two) times daily as needed for dry skin.  [name] Kitchen atorvastatin (LIPITOR) 40 MG tablet Take 1 tablet (40 mg total) by mouth daily.  . baclofen (LIORESAL) 10 MG tablet Take 1 tablet (10 mg total) by mouth daily as needed for muscle spasms.  . blood glucose meter kit and supplies KIT Dispense based on patient and insurance preference. Use up to four times daily as directed. (FOR ICD-9 250.00, 250.01).  . busPIRone (  BUSPAR) 10 MG tablet Take 1 tablet (10 mg total) by mouth 3 (three) times daily.  . Capsaicin-Menthol (PAIN RELIEF EX) Apply 1 application topically 3 (three) times daily as needed (shoulder pain).  . citalopram (CELEXA) 40 MG tablet TAKE 1 TABLET BY MOUTH EVERY DAY (Patient taking differently: Take 40 mg by mouth every morning. )  . diclofenac sodium (VOLTAREN) 1 % GEL APPLY 2 GRAMS TO AFFECTED AREA 4 TIMES A DAY  . diltiazem (CARDIZEM CD) 120 MG 24 hr capsule Take 1 capsule (120 mg total) by mouth 2 (two) times daily.  [name] Kitchen docusate sodium (COLACE) 100 MG capsule Take 200 mg by mouth 2 (two) times daily.  . fluticasone (FLONASE) 50 MCG/ACT nasal spray Place 2 sprays into both nostrils 2 (two) times daily.  . Fluticasone-Umeclidin-Vilant (TRELEGY ELLIPTA) 100-62.5-25 MCG/INH AEPB Inhale 1 puff into the lungs daily.  . furosemide (LASIX) 40 MG tablet Take 2 tablets (80 mg total) by mouth 2 (two) times daily.  [name] Kitchen gabapentin (NEURONTIN) 300 MG capsule TAKE 2 CAPSULES (600 MG TOTAL) BY MOUTH 3 (THREE) TIMES DAILY.  [name] Kitchen HYDROcodone-acetaminophen (NORCO) 7.5-325 MG tablet Take 1 tablet by mouth every 6 (six) hours as needed for moderate  pain.  . hydrOXYzine (VISTARIL) 100 MG capsule Take 1 capsule (100 mg total) by mouth 3 (three) times daily as needed for itching.  . Insulin Glargine (LANTUS SOLOSTAR) 100 UNIT/ML Solostar Pen Inject 10 Units into the skin daily.  . insulin lispro (HUMALOG KWIKPEN) 100 UNIT/ML KiwkPen Inject 0.03 mLs (3 Units total) into the skin 3 (three) times daily before meals.  . Insulin Pen Needle (B-D UF III MINI PEN NEEDLES) 31G X 5 MM MISC CHECK SUGARS 4 TIMES A DAY BEFORE MEALS AND AT BEDTIME.  [name] Kitchen JARDIANCE 25 MG TABS tablet TAKE 1 TABLET BY MOUTH EVERY DAY (Patient taking differently: Take 25 mg by mouth every morning. Give in the morning with or without food.)  . ketoconazole (NIZORAL) 2 % cream Apply 1 fingertip amount to each foot daily.  [name] Kitchen loratadine (CLARITIN) 10 MG tablet Take 1 tablet (10 mg total) by mouth daily.  [name] Kitchen losartan (COZAAR) 100 MG tablet Take 1 tablet (100 mg total) by mouth daily.  . metolazone (ZAROXOLYN) 2.5 MG tablet TAKE 1 TABLET TWICE WEEKLY ON MONDAYS AND THURSDAYS ONLY  . montelukast (SINGULAIR) 10 MG tablet TAKE 1 TABLET AT BEDTIME  . Multiple Vitamins-Minerals (MULTIVITAMIN WITH MINERALS) tablet Take 1 tablet by mouth daily.  . mupirocin ointment (BACTROBAN) 2 % APPLY TO AFFECTED AREA TWICE A DAY  . NARCAN 4 MG/0.1ML LIQD nasal spray kit   . NON FORMULARY Uses a C-PAP at bedtime  . nystatin (MYCOSTATIN) 100000 UNIT/ML suspension Take 5 mLs (500,000 Units total) by mouth 4 (four) times daily. Swish and swallow. (Patient taking differently: Take 5 mLs by mouth daily as needed (mouth sores). Swish and swallow.)  . omeprazole (PRILOSEC) 20 MG capsule Take 1 capsule (20 mg total) by mouth daily as needed.  [name] Kitchen oxybutynin (DITROPAN XL) 10 MG 24 hr tablet Take 1 tablet (10 mg total) by mouth at bedtime.  . OXYGEN Inhale 2 L into the lungs continuous.  . potassium chloride SA (KLOR-CON M20) 20 MEQ tablet Take 1 tablet once weekly on Monday's only  . PROAIR HFA 108 (90 Base) MCG/ACT  inhaler INHALE 2 PUFFS INTO THE LUNGS EVERY 6 (SIX) HOURS AS NEEDED FOR SHORTNESS OF BREATH.  . promethazine (PHENERGAN) 25 MG tablet Take 1 tablet (25 mg total) by mouth  every 6 (six) hours as needed for nausea.  . Vitamin D, Ergocalciferol, (DRISDOL) 50000 units CAPS capsule Take 50,000 Units by mouth every 30 (thirty) days.    Current Facility-Administered Medications on File Prior to Visit  Medication  . technetium tetrofosmin (TC-MYOVIEW) injection 16.1 millicurie     Allergies:   Chantix [varenicline tartrate]; Corticosteroids; Methadone hcl; Aspirin; and Levofloxacin   Social History   Socioeconomic History  . Marital status: Single    Spouse name: Not on file  . Number of children: 1  . Years of education: 75  . Highest education level: 12th grade  Occupational History  . Not on file  Social Needs  . Financial resource strain: Not hard at all  . Food insecurity:    Worry: Never true    Inability: Never true  . Transportation needs:    Medical: No    Non-medical: No  Tobacco Use  . Smoking status: Former Smoker    Packs/day: 1.00    Years: 40.00    Pack years: 40.00    Types: Cigarettes    Start date: 01/28/1977    Last attempt to quit: 09/07/2017    Years since quitting: 0.5  . Smokeless tobacco: Never Used  . Tobacco comment: Quit prior to hospitalization using low dose Chantix  Substance and Sexual Activity  . Alcohol use: No    Alcohol/week: 0.0 standard drinks  . Drug use: Not Currently    Types: Marijuana, "Crack" cocaine    Comment: hx marijuana usemany years ago, Crack -none since 11/2015  . Sexual activity: Not Currently    Partners: Male    Birth control/protection: Post-menopausal  Lifestyle  . Physical activity:    Days per week: 0 days    Minutes per session: 0 min  . Stress: Not at all  Relationships  . Social connections:    Talks on phone: More than three times a week    Gets together: Twice a week    Attends religious service: 1 to 4  times per year    Active member of club or organization: No    Attends meetings of clubs or organizations: Never    Relationship status: Divorced  Other Topics Concern  . Not on file  Social History Narrative   Current Social History       Who lives at home: Patient lives alone in one level home; has 4 steps onto porch with a handrail. Has smoke alarms, no throw rugs. Has elevated toilet. Has shower bench in tub with grab rails.    Transportation: Patient has own vehicle and drives herself    Important Relationships "Family and friends"    Pets: None    Likes to eat varied diet. Seafood, fish, roasted [place] breast, vegetables and salads and fruits.   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 I'm a lovable person."  Family History: The patient's family history includes Alcohol abuse in her father; Arthritis in her sister; Asthma in her sister and another family member; Breast cancer in her maternal aunt; COPD in an other family member; Cancer in her maternal aunt; Depression in her sister; Diabetes in an other family member; Hypertension in an other family member; Pneumonia in her father; Pulmonary embolism in her mother; Sickle cell anemia in her son; Stroke in her father. no CAD known. Father died age 72 from stroke, mother died age 58 of blood clot. Has a 19 year old child with sickle cell disease.  ROS:   Please see the history of present illness.  Additional pertinent ROS: Constitutional: Negative for chills, fever, night sweats, unintentional weight loss  HENT: Negative for ear pain and hearing loss.   Eyes: Negative for loss of  vision and eye pain.  Respiratory: Positive for shortness of breath. Negative for cough, sputum,  wheezing.   Cardiovascular: As per HPI Gastrointestinal: Negative for abdominal pain, melena, and hematochezia.  Genitourinary: Negative for dysuria and hematuria.  Musculoskeletal: Negative for falls. Positive for muscle spasms. Skin: Negative for itching and rash.  Neurological: Negative for focal weakness, focal sensory changes and loss of consciousness.  Endo/Heme/Allergies: Does not bruise/bleed easily.   EKGs/Labs/Other Studies Reviewed:    The following studies were reviewed today: Monitor 12/18/17 3 days of rhythm interpreted on Zio patch. Patient had a min HR of 58 bpm, max HR of 193 bpm, and avg HR of 81 bpm. Predominant underlying rhythm was Sinus Rhythm. 217 Supraventricular Tachycardia runs occurred, the run with the fastest interval lasting 6 beats with a max rate of 193 bpm, the longest lasting 1 min 13 secs with an avg rate of 151 bpm. True duration of Supraventricular Tachycardia difficult to ascertain due to Artifact. Thirteen triggered patient events recorded. Supraventricular Tachycardia was detected within +/- 45 seconds of symptomatic patient event(s). Isolated SVEs were occasional (2.0%, 6813), SVE Couplets were rare (<1.0%, 235), and SVE Triplets were rare (<1.0%, 66). No significant ventricular ectopy noted.  Echo 9.4.19 Study Conclusions  - Left ventricle: The cavity size was normal. Wall thickness was   normal. Systolic function was normal. The estimated ejection   fraction was in the range of 55% to 60%. Wall motion was normal;   there were no regional wall motion abnormalities. Doppler   parameters are consistent with abnormal left ventricular   relaxation (grade 1 diastolic dysfunction).  Lexiscan MPI 11/11/17 Study Highlights    Nuclear stress EF: 71%.  The left ventricular ejection fraction is hyperdynamic (>65%).  There was no ST segment deviation  noted during stress.  There is a small defect of mild severity present in the apical septal location. The defect is non-reversible. No ischemia noted.  This is a low risk study.   EKG:  EKG is personally reviewed today.  The ekg ordered previously demonstrates normal sinus rhythm with atrial bigeminy, poor R wave progression  Recent Labs: 10/07/2017: Magnesium 1.9 01/27/2018: B Natriuretic Peptide 36.9; Hemoglobin 14.6; Platelets 344 02/23/2018: ALT 26 03/06/2018: BUN 36; Creatinine, Ser 1.58; Potassium 4.2; Sodium 145; TSH 0.469  Recent Lipid Panel    Component Value Date/Time   CHOL 145 10/27/2017 0944   TRIG 98 10/27/2017 0944   HDL 55 10/27/2017 0944   CHOLHDL 2.6 10/27/2017 0944   CHOLHDL 4.0 04/17/2015 0917   VLDL 34 (H) 04/17/2015 0917   LDLCALC 70 10/27/2017 0944   LDLDIRECT 114 (H) 09/15/2012 1559  Physical Exam:    VS:  BP 127/78   Pulse 78   Ht '5\' 10"'  (1.778 m)   Wt (!) 410 lb (186 kg)   LMP 04/10/2006   BMI 58.83 kg/m     Wt Readings from Last 3 Encounters:  03/19/18 (!) 410 lb (186 kg)  03/17/18 (!) 410 lb 11.2 oz (186.3 kg)  03/06/18 (!) 415 lb 6.4 oz (188.4 kg)    GEN: Well nourished, well developed in no acute distress. O2 via nasal cannula in place. HEENT: Normal NECK: No apparent JVD (difficult due to body habitus); No carotid bruits LYMPHATICS: No lymphadenopathy CARDIAC: distant heart sounds, regular rhythm, normal S1 and S2, no murmurs, rubs, gallops. Radial and DP pulses 2+ bilaterally. RESPIRATORY:  Distant but clear to auscultation without rales, wheezing or rhonchi  ABDOMEN: Soft, non-tender, non-distended MUSCULOSKELETAL:  No pitting edema, but does have some chronic brawny edema bilaterally; No deformity  SKIN: Warm and dry NEUROLOGIC:  Alert and oriented x 3 PSYCHIATRIC:  Normal affect   ASSESSMENT:    1. Chronic diastolic heart failure (HCC)   2. Class 3 severe obesity due to excess calories with serious comorbidity and body mass  index (BMI) of 50.0 to 59.9 in adult (HCC)   3. Paroxysmal SVT (supraventricular tachycardia) ([place])   4. Obstructive sleep apnea treated with BiPAP   5. Obesity hypoventilation syndrome ([place])   6. Essential hypertension   7. Type 2 diabetes mellitus with stage 3 chronic kidney disease, with long-term current use of insulin ([place])   8. Pure hypercholesterolemia    PLAN:    Chronic diastolic heart failure: Has been having highly fluctuating weights. She feels that weights >410 lbs are more likely to result in her "breathing crises" than when her weight is lower. Reviewed her log of weights today.  -will keep furosemide dosing the same, 80 mg BID -will continue metolazone 2.5 mg on Mondays -if her weight is over 410 lbs on Thursdays, she can take an additional dose of metolazone. If she is doing this nearly every week, we will need to monitor her kidney function more closely -On SGLT2 inhibitor, with benefit for both heart failure and her type II diabetes.  -counseled on daily weights, fluid restriction, salt guidelines, when to seek immediate medical attention  History of syncope, with recent SVT diagnosis:  SVT on monitor, but reviewed recent event in note from 03/06/18 (was having symptoms, but heart rate was slow and regular). Unclear etiology for her episodes. Likely multifactorial with her breathing, deconditioning. At risk of complications due to multiple neurologic medications as well.  -frequent SVT on monitor, reports good control of symptoms on diltiazem, avoiding beta blocker due to chronic respiratory issues  -BID dosing of diltiazem 120 mg CD seems to best prevent waxing/waning effect  -has already had echo and lexiscan stress test, unremarkable  Class 3 obesity, BMI 58: Actively pursuing bariatric surgery and weight loss. As documented previously, I support her weight loss efforts, and I suspect that her comorbidities will remain difficult to manage if she does not lose significant  weight.   Hypercholesterolemia: LDL was 70 on 10/27/17, at goal. Continue atorvastatin 40 mg. Coronary artery calcification seen on chest CT, but no ischemia on recent lexiscan.  Hypertension: well controlled today. Continue losartan 100 mg daily in addition to diuretics.  OSA on PAP, obesity hypoventilation syndrome, chronic hypoxemic respiratory failure on oxygen: followed by pulmonology  Type II diabetes, on long term insulin use, with chronic kidney disease:  on SGLT2 inhibitor for diabetes, cardiac, and renal protection  Prevention and cardiac disease counseling, including heart failure education:   The 10-year ASCVD risk score Mikey Bussing DC Jr., et al., 2013) is: 14.9%   Values used to calculate the score:     Age: 73 years     Sex: Female     Is Non-Hispanic African American: Yes     Diabetic: Yes     Tobacco smoker: Yes     Systolic Blood Pressure: 829 mmHg     Is BP treated: Yes     HDL Cholesterol: 55 mg/dL     Total Cholesterol: 145 mg/dL  Plan for follow up: 6-8 weeks  TIME SPENT WITH PATIENT: 40 minutes of direct patient care. More than 50% of that time was spent on coordination of care and counseling regarding symptoms, fluid management.  [name], MD, PhD [place]  [facility]   Medication Adjustments/Labs and Tests Ordered: Current medicines are reviewed at length with the patient today.  Concerns regarding medicines are outlined above.  No orders of the defined types were placed in this encounter.  No orders of the defined types were placed in this encounter.   Patient Instructions  Medication Instructions:  Take Metolazone 2.5 mg on Monday. On Thursday, if weight is greater than 410 lbs take 1 dose of Metolazone 2.5 mg. If you are consistently having to take an extra every Thursday, please call our office.  If you need a refill on your cardiac medications before your next appointment, please call your pharmacy.   Lab  work: None   Testing/Procedures: None  Follow-Up: At [facility], you and your health needs are our priority.  As part of our continuing mission to provide you with exceptional heart care, we have created designated Provider Care Teams.  These Care Teams include your primary Cardiologist (physician) and Advanced Practice Providers (APPs -  Physician Assistants and Nurse Practitioners) who all work together to provide you with the care you need, when you need it. You will need a follow up appointment in 6-8 weeks.  Please call our office 2 months in advance to schedule this appointment.  You may see [name], MD or one of the following Advanced Practice Providers on your designated Care Team:   Rosaria Ferries, PA-C . Jory Sims, DNP, ANP       Signed, [name], MD PhD 03/20/2018 10:34 AM    [place]

## 2018-03-20 ENCOUNTER — Encounter: Payer: Self-pay | Admitting: Cardiology

## 2018-03-20 DIAGNOSIS — E7800 Pure hypercholesterolemia, unspecified: Secondary | ICD-10-CM | POA: Insufficient documentation

## 2018-03-20 DIAGNOSIS — I471 Supraventricular tachycardia: Secondary | ICD-10-CM | POA: Insufficient documentation

## 2018-03-24 ENCOUNTER — Ambulatory Visit (INDEPENDENT_AMBULATORY_CARE_PROVIDER_SITE_OTHER): Payer: Medicare Other | Admitting: Student in an Organized Health Care Education/Training Program

## 2018-03-24 ENCOUNTER — Other Ambulatory Visit: Payer: Self-pay

## 2018-03-24 ENCOUNTER — Encounter (INDEPENDENT_AMBULATORY_CARE_PROVIDER_SITE_OTHER): Payer: Self-pay | Admitting: Specialist

## 2018-03-24 ENCOUNTER — Encounter: Payer: Self-pay | Admitting: Student in an Organized Health Care Education/Training Program

## 2018-03-24 VITALS — BP 112/72 | HR 95 | Temp 98.8°F | Ht 70.0 in | Wt >= 6400 oz

## 2018-03-24 DIAGNOSIS — Z79899 Other long term (current) drug therapy: Secondary | ICD-10-CM

## 2018-03-24 MED ORDER — BACLOFEN 10 MG PO TABS: 10.0000 mg | ORAL_TABLET | Freq: Three times a day (TID) | ORAL | 0 refills | Status: DC | PRN

## 2018-03-24 NOTE — Progress Notes (Signed)
CC: requests refill on baclofen  HPI: [name] is a 53 y.o. female :  Patient Active Problem List   Diagnosis Date Noted  . Paroxysmal SVT (supraventricular tachycardia) ([place]) 03/20/2018  . Pure hypercholesterolemia 03/20/2018  . Muscle cramping 02/25/2018  . Chest pressure 02/20/2018  . Nodule of upper lobe of right lung 02/13/2018  . Falls, subsequent encounter 01/30/2018  . Open wound of left foot 11/04/2017  . Essential hypertension 10/27/2017  . Solitary pulmonary nodule 10/07/2017  . Acute on chronic right-sided congestive heart failure ([place])   . Restrictive lung disease secondary to obesity 10/01/2017  . Polycythemia, secondary 10/01/2017  . Chronic viral hepatitis B without delta-agent ([place]) 07/08/2017  . COPD (chronic obstructive pulmonary disease) with chronic bronchitis ([place]) 06/27/2017  . Chronic kidney disease (CKD), stage IV (severe) ([place]) 01/14/2017  . Type 2 diabetes mellitus with stage 3 chronic kidney disease, with long-term current use of insulin ([place]) 12/12/2016  . Urge incontinence of urine 12/05/2016  . Trigger finger, right ring finger 07/15/2016  . Class 3 severe obesity due to excess calories with serious comorbidity and body mass index (BMI) of 50.0 to 59.9 in adult ([place]) 10/02/2012  . Chronic diastolic heart failure ([place]) 04/07/2012  . GERD 01/26/2010  . Hypertension 08/04/2008  . Obstructive sleep apnea treated with BiPAP 02/03/2008  . FIBROCYSTIC BREAST DISEASE 10/28/2006  . Hyperlipidemia 03/27/2006  . Obesity hypoventilation syndrome ([place]) 03/27/2006  . Former tobacco use 03/27/2006  . Depression with anxiety 03/27/2006   53 year old female with numerous comorbidities noted above presents requesting refills for baclofen.  Patient was previously was prescribed 20 mg of baclofen which she takes 3 times daily.  She wakes up in the morning and the first thing she does is take her baclofen because she knows that she will have muscle spasms  throughout the day.  She feels that her muscle spasms are related to fluid shifts with her heart failure.  This medication was initially prescribed by orthopedic surgery and had been refilled in our office.  When she requested a refill of this month, I called the patient and pointed out that she is on a high dose of medication which is concerning for her safety.  Additionally she follows with pain management and is taking opioids for chronic pain.  Initially the patient agreed to taper down this medication when I spoke with her over the phone, however when she ran out of medication she called back very upset that it was not continuing to be prescribed.  She came into the office to discuss this further today.  She reports that she has had worsening of her muscle spasms in her hands,  arms and legs after running out of the baclofen. She has previously complained of episodic ataxia and feeling that she was going to pass out which she reports have improved over the last week.    The patient initially becomes very frustrated when told that is not safe for me to continue prescribing her a muscle relaxer, particularly at such a high dose with concurrent use of opioid medication.  Dr. Andria Frames came into the office to help mediate this discussion.  We pointed out to the patient that there is a potential risk for death with opioid plus concurrent muscle relaxer use.  Patient gradually became more receptive to this information and became more agreeable to tapering the medication.  Review of Symptoms:  See HPI for ROS.   CC, SH/smoking status, and VS noted.  Objective:  BP 112/72   Pulse 95   Temp 98.8 F (37.1 C) (Oral)   Ht 5\' 10"  (1.778 m)   Wt (!) 402 lb 12.8 oz (182.7 kg)   LMP 04/10/2006   SpO2 91%   BMI 57.80 kg/m  GEN: NAD, alert, obese chronically ill female with oxygen RESPIRATORY: Comfortable work of breathing, speaks in full sentences CV: Regular rate noted, distal extremities well perfused and  warm without edema GI: Soft, nondistended SKIN: warm and dry, no rashes or lesions NEURO: II-XII grossly intact MSK: Moves 4 extremities equally PSYCH: AAOx3, appropriate affect   Assessment and plan:  Chronic muscle relaxer use -engaged in shared decision making.  Ultimately the patient agreed that it is safest to have one physician managing all of her pain medications.  She will discuss this with her pain management physician at her follow-up visit on March 3.  We will refill her baclofen at 10 mg only to be used as needed at most 3 times per day.  She is understanding of the risks of taking this medication with her opioid.  No further refills of muscle relaxer including baclofen to be provided at the family medicine center.  By the end of the visit, the patient is agreeable with this plan.  Meds ordered this encounter  Medications  . baclofen (LIORESAL) 10 MG tablet    Sig: Take 1 tablet (10 mg total) by mouth 3 (three) times daily as needed for muscle spasms.    Dispense:  90 each    Refill:  0   [name], MD,MS,  PGY3 03/24/2018 11:19 AM

## 2018-03-24 NOTE — Patient Instructions (Signed)
It was a pleasure seeing you today in our clinic.  We discussed today the risk of death with muscle relaxers and opioids.  Because of this we decided together that it would be safest for your pain management doctor to manage all of your pain medicines you can decide together at your next visit whether the combination of these pills continues to be appropriate for you.  We will refill 1 month of muscle relaxers to be taken as needed no more than 3 times per day.  We will not plan on doing any further refills of this medication at the family medicine center.  Our clinic's number is [phone]. Please call with questions or concerns about what we discussed today.  Be well, Dr. Burr Medico

## 2018-03-31 DIAGNOSIS — G894 Chronic pain syndrome: Secondary | ICD-10-CM | POA: Diagnosis not present

## 2018-03-31 DIAGNOSIS — M48061 Spinal stenosis, lumbar region without neurogenic claudication: Secondary | ICD-10-CM | POA: Diagnosis not present

## 2018-03-31 DIAGNOSIS — R768 Other specified abnormal immunological findings in serum: Secondary | ICD-10-CM | POA: Diagnosis not present

## 2018-03-31 DIAGNOSIS — Z79899 Other long term (current) drug therapy: Secondary | ICD-10-CM | POA: Diagnosis not present

## 2018-04-03 ENCOUNTER — Other Ambulatory Visit: Payer: Self-pay | Admitting: Family Medicine

## 2018-04-04 DIAGNOSIS — G4733 Obstructive sleep apnea (adult) (pediatric): Secondary | ICD-10-CM | POA: Diagnosis not present

## 2018-04-04 DIAGNOSIS — J9621 Acute and chronic respiratory failure with hypoxia: Secondary | ICD-10-CM | POA: Diagnosis not present

## 2018-04-04 DIAGNOSIS — M5090 Cervical disc disorder, unspecified, unspecified cervical region: Secondary | ICD-10-CM | POA: Diagnosis not present

## 2018-04-06 DIAGNOSIS — G4733 Obstructive sleep apnea (adult) (pediatric): Secondary | ICD-10-CM | POA: Diagnosis not present

## 2018-04-06 DIAGNOSIS — J9621 Acute and chronic respiratory failure with hypoxia: Secondary | ICD-10-CM | POA: Diagnosis not present

## 2018-04-06 DIAGNOSIS — M5090 Cervical disc disorder, unspecified, unspecified cervical region: Secondary | ICD-10-CM | POA: Diagnosis not present

## 2018-04-08 ENCOUNTER — Other Ambulatory Visit: Payer: Self-pay | Admitting: Student in an Organized Health Care Education/Training Program

## 2018-04-10 ENCOUNTER — Encounter: Payer: Self-pay | Admitting: Student in an Organized Health Care Education/Training Program

## 2018-04-14 ENCOUNTER — Ambulatory Visit: Payer: Medicare Other | Admitting: Dietician

## 2018-04-15 ENCOUNTER — Other Ambulatory Visit: Payer: Self-pay | Admitting: Student in an Organized Health Care Education/Training Program

## 2018-04-16 ENCOUNTER — Encounter (INDEPENDENT_AMBULATORY_CARE_PROVIDER_SITE_OTHER): Payer: Self-pay | Admitting: Orthopaedic Surgery

## 2018-04-16 ENCOUNTER — Other Ambulatory Visit: Payer: Self-pay

## 2018-04-16 ENCOUNTER — Ambulatory Visit (INDEPENDENT_AMBULATORY_CARE_PROVIDER_SITE_OTHER): Payer: Medicare Other | Admitting: Orthopaedic Surgery

## 2018-04-16 DIAGNOSIS — M65322 Trigger finger, left index finger: Secondary | ICD-10-CM

## 2018-04-16 DIAGNOSIS — M65321 Trigger finger, right index finger: Secondary | ICD-10-CM

## 2018-04-16 NOTE — Progress Notes (Signed)
Office Visit Note   Patient: [name]           Date of Birth: Oct 06, 1965           MRN: [id number] Visit Date: 04/16/2018              Requested by: [name], MD [address] [place], [place] [address] PCP: [name], MD   Assessment & Plan: Visit Diagnoses:  1. Trigger index finger of left hand   2. Trigger index finger of right hand     Plan: Impression is bilateral index trigger fingers.  Today, we discussed injections versus surgical intervention.  The patient does not want an injection at any cost to either side.  We did discuss the inability to proceed with surgical intervention for the next 4 weeks as this would be considered an elective procedure.  She will follow-up with [place] after that time.  For further discussion.  Follow-Up Instructions: Return if symptoms worsen or fail to improve.   Orders:  No orders of the defined types were placed in this encounter.  No orders of the defined types were placed in this encounter.     Procedures: No procedures performed   Clinical Data: No additional findings.   Subjective: Chief Complaint  Patient presents with  . Left Index Finger - Pain  . Right Index Finger - Pain    HPI patient is a pleasant 53 year old female who presents our clinic today with bilateral index trigger fingers left greater than right.  She has had triggering to the left for the past few months.  She recently noticed triggering to the right side.  She has some pain over the A1 pulley on the left, but no pain as of yet on the right.  She is status post A1 pulley release right ring finger approximately 3 months ago.  Doing well there.  Review of Systems as detailed in HPI.  All others reviewed and are negative.   Objective: Vital Signs: LMP 04/10/2006   Physical Exam well-developed and well-nourished female in no acute distress.  Alert and oriented x3.  Ortho Exam examination of the left index finger reveals noticeable triggering.   She does have moderate pain and palpable nodule to the A1 pulley.  Examination of her right ring finger reveals no noticeable triggering.  No tenderness or palpable nodule to the A1 pulley.  She is neurovascularly intact distally.  Specialty Comments:  No specialty comments available.  Imaging: No new imaging   PMFS History: Patient Active Problem List   Diagnosis Date Noted  . Paroxysmal SVT (supraventricular tachycardia) ([place]) 03/20/2018  . Pure hypercholesterolemia 03/20/2018  . Muscle cramping 02/25/2018  . Chest pressure 02/20/2018  . Nodule of upper lobe of right lung 02/13/2018  . Falls, subsequent encounter 01/30/2018  . Open wound of left foot 11/04/2017  . Essential hypertension 10/27/2017  . Solitary pulmonary nodule 10/07/2017  . Acute on chronic right-sided congestive heart failure ([place])   . Restrictive lung disease secondary to obesity 10/01/2017  . Polycythemia, secondary 10/01/2017  . Chronic viral hepatitis B without delta-agent ([place]) 07/08/2017  . COPD (chronic obstructive pulmonary disease) with chronic bronchitis ([place]) 06/27/2017  . Chronic kidney disease (CKD), stage IV (severe) ([place]) 01/14/2017  . Type 2 diabetes mellitus with stage 3 chronic kidney disease, with long-term current use of insulin ([place]) 12/12/2016  . Urge incontinence of urine 12/05/2016  . Trigger index finger of left hand 07/15/2016  . Class 3 severe obesity  due to excess calories with serious comorbidity and body mass index (BMI) of 50.0 to 59.9 in adult [facility]) 10/02/2012  . Chronic diastolic heart failure ([place]) 04/07/2012  . GERD 01/26/2010  . Hypertension 08/04/2008  . Obstructive sleep apnea treated with BiPAP 02/03/2008  . FIBROCYSTIC BREAST DISEASE 10/28/2006  . Hyperlipidemia 03/27/2006  . Obesity hypoventilation syndrome ([place]) 03/27/2006  . Former tobacco use 03/27/2006  . Depression with anxiety 03/27/2006   Past Medical History:  Diagnosis Date  . Anxiety   . Arthritis  04-10-11   hips, shoulders, back  . Asthma   . Bipolar disorder ([place])   . Cancer ([place]) 1993   cervical, no treatment done, went away per pt  . Cervical dysplasia or atypia 04-10-11   '93- once dx.-got pregnant-no intervention, then postpartum, no dysplasia found  . CHF (congestive heart failure) ([place])    no cardiologist 2014 dx, none now  . Chronic kidney disease    stage 3 kidney disease  . 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 Pathology showed Condyloma, gluteal CONDYLOMA ACUMINATUM.   [name] Kitchen COPD (chronic obstructive pulmonary disease) ([place])   . Diabetes mellitus without complication ([place])   . Dyspnea    with actity, sitting  . Fall 12/16/2016  . Fibrocystic breast disease   . GERD (gastroesophageal reflux disease)   . Hepatitis    hep B-count is low at present,doesn't register  . Hypercalcemia   . Hyperlipidemia   . Hypertension   . Lung nodule 11/2017   right and left lung  . Migraine headache    none recent  . Morbid obesity ([place]) 03/27/2006  . Neuropathy   . Pneumonia    walking pneu 15 yrs. ago  . Skin lesion 03/15/2011   In gluteal crease now s/p removal by Dr. [name] of General Surgery on 3/12. Path shows condyloma.     . Sleep apnea 04-10-11   uses cpap, pt does not know settings  . Sleep apnea   . Trigger finger    left third    Family History  Problem Relation Age of Onset  . Pulmonary embolism Mother   . Stroke Father   . Alcohol abuse Father   . Pneumonia Father   . Cancer Maternal Aunt        breast  . Breast cancer Maternal Aunt   . Asthma Other   . COPD Other   . Hypertension Other   . Diabetes Other   . Asthma Sister   . Depression Sister   . Arthritis Sister   . Sickle cell anemia Son     Past Surgical History:  Procedure Laterality Date  . ANAL FISTULECTOMY  04/17/2011   Procedure: FISTULECTOMY ANAL;  Surgeon: Imogene Burn. [name], MD;  Location: [facility];  Service: General;  Laterality: N/A;  Excision of Condyloma Gluteal  Cleft   . BACK SURGERY  04-10-11   x5-Lumbar fusion-retained hardware.(Dr. Louanne Skye)  . BREAST CYST EXCISION     bilateral breast, 3 cysts removed from each breast  . BREAST EXCISIONAL BIOPSY Right    x 3  . BREAST EXCISIONAL BIOPSY Left    x 3  . CARPAL TUNNEL RELEASE Bilateral   . Cervical biospy    . COLONOSCOPY WITH PROPOFOL N/A 07/06/2015   Procedure: COLONOSCOPY WITH PROPOFOL;  Surgeon: [name], MD;  Location: [facility];  Service: Endoscopy;  Laterality: N/A;  . DOPPLER ECHOCARDIOGRAPHY  04/06/2012   AT [place] 55-60%  . FRACTURE  SURGERY    . I&D EXTREMITY Left 12/18/2016   Procedure: IRRIGATION AND DEBRIDEMENT LEFT FOOT, CLOSURE;  Surgeon: [name], MD;  Location: [facility];  Service: Orthopedics;  Laterality: Left;  [name] Kitchen MULTIPLE TOOTH EXTRACTIONS    . NECK SURGERY  04-10-11   x3- cervical fusion with plating and screws-Dr. [name]  . TEE WITHOUT CARDIOVERSION N/A 04/06/2012   Procedure: TRANSESOPHAGEAL ECHOCARDIOGRAM (TEE);  Surgeon: [name], MD;  [facility] END[facility]COPY;  Service: Cardiovascular;  Laterality: N/A;  . TRIGGER FINGER RELEASE Right    middle finger  . TRIGGER FINGER RELEASE Left 06/28/2016   Procedure: RELEASE TRIGGER FINGER LEFT 3RD FINGER;  Surgeon: [name], MD;  Location: [facility];  Service: Orthopedics;  Laterality: Left;  . TRIGGER FINGER RELEASE Right 06/12/2016   Procedure: RIGHT INDEX FINGER TRIGGER RELEASE;  Surgeon: [name], MD;  Location: [facility];  Service: Orthopedics;  Laterality: Right;  . TRIGGER FINGER RELEASE Right 01/19/2018   Procedure: RIGHT RING FINGER TRIGGER FINGER RELEASE;  Surgeon: [name], MD;  Location: [facility];  Service: Orthopedics;  Laterality: Right;   Social History   Occupational History  . Not on file  Tobacco Use  . Smoking status: Former Smoker    Packs/day: 1.00    Years: 40.00    Pack years: 40.00    Types: Cigarettes    Start date: 01/28/1977    Last attempt to quit: 09/07/2017    Years since  quitting: 0.6  . Smokeless tobacco: Never Used  . Tobacco comment: Quit prior to hospitalization using low dose Chantix  Substance and Sexual Activity  . Alcohol use: No    Alcohol/week: 0.0 standard drinks  . Drug use: Not Currently    Types: Marijuana, "Crack" cocaine    Comment: hx marijuana usemany years ago, Crack -none since 11/2015  . Sexual activity: Not Currently    Partners: Male    Birth control/protection: Post-menopausal

## 2018-04-20 ENCOUNTER — Other Ambulatory Visit: Payer: Self-pay | Admitting: *Deleted

## 2018-04-21 ENCOUNTER — Ambulatory Visit (INDEPENDENT_AMBULATORY_CARE_PROVIDER_SITE_OTHER)
Admission: RE | Admit: 2018-04-21 | Discharge: 2018-04-21 | Disposition: A | Discharge status: Home / Self Care | Payer: Medicare Other | Source: Ambulatory Visit | Attending: Pulmonary Disease | Admitting: Pulmonary Disease

## 2018-04-21 ENCOUNTER — Other Ambulatory Visit: Payer: Self-pay

## 2018-04-21 ENCOUNTER — Telehealth: Payer: Self-pay | Admitting: Pulmonary Disease

## 2018-04-21 ENCOUNTER — Telehealth: Payer: Self-pay

## 2018-04-21 DIAGNOSIS — R768 Other specified abnormal immunological findings in serum: Secondary | ICD-10-CM | POA: Diagnosis not present

## 2018-04-21 DIAGNOSIS — E1122 Type 2 diabetes mellitus with diabetic chronic kidney disease: Secondary | ICD-10-CM | POA: Diagnosis not present

## 2018-04-21 DIAGNOSIS — R918 Other nonspecific abnormal finding of lung field: Secondary | ICD-10-CM | POA: Diagnosis not present

## 2018-04-21 DIAGNOSIS — R911 Solitary pulmonary nodule: Secondary | ICD-10-CM | POA: Diagnosis not present

## 2018-04-21 DIAGNOSIS — M109 Gout, unspecified: Secondary | ICD-10-CM | POA: Diagnosis not present

## 2018-04-21 DIAGNOSIS — N183 Chronic kidney disease, stage 3 (moderate): Secondary | ICD-10-CM | POA: Diagnosis not present

## 2018-04-21 NOTE — Telephone Encounter (Signed)
Okay thanks .   Dr., Valeta Harms :  Call report on this patient for suspicious nodule  Would you like me to call patient and order PET scan ?   Just let me know   Ashna Dorough

## 2018-04-21 NOTE — Telephone Encounter (Signed)
Contacted pt and she stated that you could send this to [facility] in [facility].  She stated that their number is [phone] if you need it. April Zimmerman Rumple, CMA

## 2018-04-21 NOTE — Telephone Encounter (Signed)
I received a notification that her pharmacy is unable to fill this medication. Please call and ask the patient for a preferred alternate pharmacy.

## 2018-04-21 NOTE — Telephone Encounter (Signed)
Call report received from [facility] at [facility] :  IMPRESSION: 1. Ill-defined sub solid nodule in the right upper lobe has increased in size, now measuring 13 x 8 mm, previously 10 x 6 mm. Findings are concerning for primary bronchogenic carcinoma. Consultation with pulmonary medicine or thoracic surgery is suggested, as clinically appropriate. 2. Increasing subpleural reticulation at the lung bases, more prominent on the right, may reflect atelectasis, but early fibrotic change is not excluded.  Message forwarded to Dr Valeta Harms and Rexene Edison, NP ( App of the day)

## 2018-04-21 NOTE — Telephone Encounter (Signed)
Due to current COVID 19 pandemic, our office is severely reducing in office visits for at least the next 2 weeks, in order to minimize the risk to our patients and healthcare providers.   I called pt and explained this to her. Pt is agreeable to a telephone visit.  Pt understands that although there may be some limitations with this type of visit, we will take all precautions to reduce any security or privacy concerns.  Pt understands that this will be treated like an in office visit and we will file with pt's insurance, and there may be a patient responsible charge related to this service. Pt denies changes in her meds, allergies, or med history. Pt reports that [facility] won't et her supplies until the MD sees her in follow up.   Pt is asking to be called at [phone] tomorrow during her scheduled appt time.

## 2018-04-22 ENCOUNTER — Ambulatory Visit (INDEPENDENT_AMBULATORY_CARE_PROVIDER_SITE_OTHER): Payer: Medicare Other | Admitting: Neurology

## 2018-04-22 DIAGNOSIS — Z9981 Dependence on supplemental oxygen: Secondary | ICD-10-CM | POA: Diagnosis not present

## 2018-04-22 DIAGNOSIS — G4733 Obstructive sleep apnea (adult) (pediatric): Secondary | ICD-10-CM | POA: Diagnosis not present

## 2018-04-22 MED ORDER — HYDROXYZINE PAMOATE 100 MG PO CAPS: 100.0000 mg | ORAL_CAPSULE | Freq: Three times a day (TID) | ORAL | 0 refills | Status: DC | PRN

## 2018-04-22 NOTE — Patient Instructions (Signed)
Over phone.

## 2018-04-22 NOTE — Progress Notes (Signed)
[name] is a 53 year old right-handed woman with an underlying medical complex medical history of COPD, congestive heart failure with oxygen dependence, urinary incontinence, diabetes, neuropathy, hypertension, hyperlipidemia, status post multiple surgeries, and morbid obesity with BMI of over 55, with whom I am conducting a phone visit today (see below). We are discussing her OSA and treatment with BiPAP. The patient is unaccompanied today. I first met her on 12/30/2017 at the request of Dr. Dorris Singh, at which time the patient reported a prior diagnosis of obstructive sleep apnea. I reviewed sleep study results from 2010. She was on AutoPap therapy at the time. She had not been able to use her machine. She was using oxygen at night but was not using oxygen when she was using her AutoPap machine. She was advised to return for sleep study testing. She had a split-night sleep study on 01/29/2018. I went over her test results with her in detail today. Her baseline sleep latency was 10.5 minutes, REM sleep was absent during the baseline portion of the study, sleep efficiency was 61%. She had a total AHI of 79.5 per hour. Average oxygen saturation was 86%, nadir was 65%. She was placed on oxygen at 10:14 PM due to repeated desaturations, 2 L/m. She was then fitted with a medium fullface mask and CPAP was titrated from 5 cm to 14 cm but she had ongoing sleep disordered breathing and ongoing lower oxygen saturations for which she was on supplemental oxygen. She was switched to BiPAP therapy of 15/11 and further titrated to 20/16 cm. Oxygen was added at 1:16 AM. On the final setting of 20/16 cm with oxygen at 2 L/m bleed in her AHI was 0 per hour and O2 nadir was 88%. Sleep efficiency during the treatment portion of the study was 94.7%. REM sleep percentage was increased at 36.7% in keeping with rebound. Her EKG during the study showed PACs. Based on her sleep study results I prescribed home treatment with standard  BiPAP therapy and 20/16 with supplemental oxygen bleed in, at 2 L/m.  Today, 04/21/2018: Please see below for phone call visit.  I reviewed her BiPAP compliance data from 03/22/2018 through 04/20/2018 which is a total of 30 days, during which time she used her machine every night with percent used days greater than 4 hours at 93%, indicating excellent compliance with an average usage of 6 hours and 14 minutes, residual AHI at goal at 1.2 per hour, leak on the high side with the 95th percentile at 27.7 L per minute on a pressure of 20/16.  The patient's allergies, current medications, family history, past medical history, past social history, past surgical history and problem list were reviewed and updated as appropriate.    Previously:  12/30/2017: (She) was previously diagnosed with obstructive sleep apnea and placed on CPAP therapy. Prior sleep study results were reviewed today from 02/21/2008. Study was interpreted by Dr. Annamaria Boots. She had a prior baseline sleep study in August 2009. She had a CPAP titration study was titrated to a pressure of 18 cm and a treatment pressure of 15 cm was recommended. I reviewed her CPAP compliance data from 09/09/2017 through 12/07/2017 which is a total of 90 days, during which time she used her machine 63 days with percent used days greater than 4 hours at 42% only, average usage of 4 hours and 30 minutes, residual AHI 2.8 per hour, 95th percentile of pressure at 9.5 cm, she is on AutoPap and leak is high. She has not used  her machine since approximately 11/22/2017. She reports that she has a problem with cockroaches at home and is getting help for this. (She had unfortunately cockroaches around the PAP machine and in the bag. We had to reschedule her appointment from 12/08/17). I reviewed your office note from 10/07/17. Her Epworth sleepiness score is 15 out of 24, fatigue score is 39/63. Her DME company is [facility]. She is single and lives alone, she has two  children. 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 She is currently using oxygen 2 L/m during the day but not at night when she uses her CPAP. She has stopped using her CPAP as she no longer has supplies. She did get a new CPAP machine about 2-3 months ago from her DME company. She denies morning headaches, has nocturia about 2-3 times per average night, typically in bed by 8 but watches TV while in bed and does not follow asleep until 11 or midnight. Rise time is around 7. When she was able to use her CPAP consistently she felt improved as far as her daytime energy and sleep quality. She has been told recently by her son and her sister that she has pauses in her breathing while asleep. She has been on supplemental oxygen during the day since October 2019. She has recently been using a small fullface mask but feels that she needs a medium size. Her air leak from the mask is indeed quite high.  Virtual Visit via Telephone Note on 04/22/18:   I connected with KAELIN HOLFORD on 37/29/02 at  8:30 AM EDT by telephone and verified that I am speaking with the correct person using two identifiers.   I discussed the limitations, risks, security and privacy concerns of performing an evaluation and management service by telephone and the availability of in person appointments. I also discussed with the patient that there may be a patient responsible charge related to this service. The patient expressed understanding and agreed to proceed.  History of Present Illness: see above discussion also.  The patient reports that she is doing very well. She has adapted rather well to the BiPAP treatment. She looks up the oxygen concentrator tubing at night with it. She is very comfortable using the BiPAP machine and is able to tolerate the pressure rather well. She does have significant nocturia and gets up several times a night but goes  right back to sleep, sleep quality is better, sleep consolidation is better, she is rather pleased with the outcome thus far. Bedtime and rise time vary. She does tend to stay up late and watch TV. She may be asleep by midnight and tends to wake up around 8. She has requested supplies through previously [facility], now [facility]. She has not heard back about receiving supplies. She is using a medium Simplus fullface mask successfully.  Observations/Objective:  Patient feels improved, her sleep quality and sleep consolidation and better, she feels better rested. She is sleeping very well she feels and is quite pleased with her outcome, very motivated to continue with her current BiPAP machine with supplemental oxygen at night.  Assessment and Plan:  In summary, ADJOA ALTHOUSE is a very pleasant 53 year old female with an underlying medical complex medical history of COPD, congestive heart failure with oxygen dependence, urinary incontinence, diabetes, neuropathy, hypertension, hyperlipidemia, status post multiple surgeries, and morbid obesity with BMI of over 55,  who presents via phone call visit for follow-up consultation of her obstructive sleep apnea, after sleep study testing and starting BiPAP therapy. She had a split-night sleep study on 01/29/2018 which confirmed severe obstructive sleep apnea, it did not respond completely to CPAP therapy only. She did well on standard BiPAP therapy, currently on 20/16 cm but did require supplemental oxygen which she had from before at 2 L/m. She is able to hook up the oxygen at night through her BiPAP mask and she is very comfortable using this and rather pleased with her outcome, she indicates good results including better sleep quality, better sleep consolidation, less daytime tiredness and overall sleeping rather well. She is highly commended for her treatment adherence and she is encouraged to be fully compliant with her BiPAP going into the future.  She is waiting for supplies, I will place an order for replacement supplies through her DME company which recently changed the name from [facility] to [facility]. She is advised to follow-up routinely with [place] in sleep clinic in 6 months, sooner if needed. I answered all her questions today and she was in agreement.   Star Age, MD, PhD  Follow Up Instructions:  6 months.    I discussed the assessment and treatment plan with the patient. The patient was provided an opportunity to ask questions and all were answered. The patient agreed with the plan and demonstrated an understanding of the instructions.   The patient was advised to call back or seek an in-person evaluation if the symptoms worsen or if the condition fails to improve as anticipated.  I provided 14 minutes of non-face-to-face time during this encounter.   Star Age, MD

## 2018-04-22 NOTE — Telephone Encounter (Signed)
PCCM: Thanks tammy. I agree. PET scan followed by FULL PFTs and likely referral to [facility] for resection as long as PFTs look good.  Thanks Garner Nash, DO [facility] 04/22/2018 7:14 PM

## 2018-04-23 DIAGNOSIS — G4733 Obstructive sleep apnea (adult) (pediatric): Secondary | ICD-10-CM | POA: Diagnosis not present

## 2018-04-24 NOTE — Telephone Encounter (Signed)
Called and spoke with patient with TP recommendations below Pt understands that PET is being ordered today This is essential during the covid19 pandemic per BI and TP Advised Libby in [facility] this is needing scheduled as soon as possible Pt and Libby expressed and verbalized understanding Nothing further needed.

## 2018-04-24 NOTE — Telephone Encounter (Signed)
CT chest showed increased RUL nodule (35mm)  size . Will need PET scan . Please set this up .   I have called patient and notified her . She Is expecting the call for appointment .   This will be considered an essential test

## 2018-04-27 ENCOUNTER — Telehealth: Payer: Self-pay | Admitting: Cardiology

## 2018-04-27 ENCOUNTER — Telehealth: Payer: Self-pay

## 2018-04-27 NOTE — Telephone Encounter (Signed)
New Message:    Pt said she needs the Web site information for her visit.

## 2018-04-27 NOTE — Telephone Encounter (Signed)
TELEPHONE CALL NOTE  [name] has been deemed a candidate for a follow-up tele-health visit to limit community exposure during the Covid-19 pandemic. I spoke with the patient via phone to ensure availability of phone/video source, confirm preferred email & phone number, and discuss instructions and expectations.  I reminded [name] to be prepared with any vital sign and/or heart rhythm information that could potentially be obtained via home monitoring, at the time of her visit. I reminded [name] to expect a phone call at the time of her visit if her visit.  Did the patient verbally acknowledge consent to treatment? Yes  Meryl Crutch, RN 04/27/2018 12:57 PM   DOWNLOADING THE [place], go to [employer] and type in WebEx in the search bar. [place] [employer], the blue/green circle. The app is free but as with any other app downloads, their phone may require them to verify saved payment information or Apple password. The patient does NOT have to create an account.  - If Android, ask patient to go to [employer] and type in WebEx in the search bar. [place] [employer], the blue/green circle. The app is free but as with any other app downloads, their phone may require them to verify saved payment information or Android password. The patient does NOT have to create an account.   CONSENT FOR TELE-HEALTH VISIT - PLEASE REVIEW  I hereby voluntarily request, consent and authorize [facility] and its employed or contracted physicians, physician assistants, nurse practitioners or other licensed health care professionals (the Practitioner), to provide me with telemedicine health care services (the "Services") as deemed necessary by the treating Practitioner. I acknowledge and consent to receive the Services by the Practitioner via telemedicine. I understand that the telemedicine visit will involve communicating with the  Practitioner through live audiovisual communication technology and the disclosure of certain medical information by electronic transmission. I acknowledge that I have been given the opportunity to request an in-person assessment or other available alternative prior to the telemedicine visit and am voluntarily participating in the telemedicine visit.  I understand that I have the right to withhold or withdraw my consent to the use of telemedicine in the course of my care at any time, without affecting my right to future care or treatment, and that the Practitioner or I may terminate the telemedicine visit at any time. I understand that I have the right to inspect all information obtained and/or recorded in the course of the telemedicine visit and may receive copies of available information for a reasonable fee.  I understand that some of the potential risks of receiving the Services via telemedicine include:  [name] Kitchen Delay or interruption in medical evaluation due to technological equipment failure or disruption; . Information transmitted may not be sufficient (e.g. poor resolution of images) to allow for appropriate medical decision making by the Practitioner; and/or  . In rare instances, security protocols could fail, causing a breach of personal health information.  Furthermore, I acknowledge that it is my responsibility to provide information about my medical history, conditions and care that is complete and accurate to the best of my ability. I acknowledge that Practitioner's advice, recommendations, and/or decision may be based on factors not within their control, such as incomplete or inaccurate data provided by me or distortions of diagnostic images or specimens that may result from electronic transmissions. I understand that the practice of medicine is not an  exact science and that Practitioner makes no warranties or guarantees regarding treatment outcomes. I acknowledge that I will receive a copy of this  consent concurrently upon execution via email to the email address I last provided but may also request a printed copy by calling the office of [facility].    I understand that my insurance will be billed for this visit.   I have read or had this consent read to me. . I understand the contents of this consent, which adequately explains the benefits and risks of the Services being provided via telemedicine.  . I have been provided ample opportunity to ask questions regarding this consent and the Services and have had my questions answered to my satisfaction. . I give my informed consent for the services to be provided through the use of telemedicine in my medical care  By participating in this telemedicine visit I agree to the above.

## 2018-04-28 ENCOUNTER — Other Ambulatory Visit: Payer: Self-pay

## 2018-04-28 ENCOUNTER — Telehealth (INDEPENDENT_AMBULATORY_CARE_PROVIDER_SITE_OTHER): Payer: Self-pay

## 2018-04-28 ENCOUNTER — Ambulatory Visit (HOSPITAL_COMMUNITY)
Admission: RE | Admit: 2018-04-28 | Discharge: 2018-04-28 | Disposition: A | Discharge status: Home / Self Care | Payer: Medicare Other | Source: Ambulatory Visit | Attending: Adult Health | Admitting: Adult Health

## 2018-04-28 DIAGNOSIS — R918 Other nonspecific abnormal finding of lung field: Secondary | ICD-10-CM | POA: Diagnosis not present

## 2018-04-28 DIAGNOSIS — R911 Solitary pulmonary nodule: Secondary | ICD-10-CM | POA: Diagnosis not present

## 2018-04-28 LAB — GLUCOSE, CAPILLARY: Glucose-Capillary: 174 mg/dL — ABNORMAL HIGH (ref 70–99)

## 2018-04-28 MED ORDER — FLUDEOXYGLUCOSE F - 18 (FDG) INJECTION: 16.0000 | Freq: Once | INTRAVENOUS | Status: DC | PRN

## 2018-04-28 NOTE — Telephone Encounter (Signed)
Attempted to contact pt to inform that virtual visit info has been sent to her mychart. Unable to leave message as mailbox is full.

## 2018-04-28 NOTE — Telephone Encounter (Signed)
Answered no to all screening questions

## 2018-04-28 NOTE — Telephone Encounter (Signed)
F/U Message            Patient just returned your call would like a call back

## 2018-04-29 ENCOUNTER — Other Ambulatory Visit: Payer: Self-pay

## 2018-04-29 ENCOUNTER — Encounter (INDEPENDENT_AMBULATORY_CARE_PROVIDER_SITE_OTHER): Payer: Self-pay | Admitting: Orthopaedic Surgery

## 2018-04-29 ENCOUNTER — Encounter: Payer: Self-pay | Admitting: Cardiology

## 2018-04-29 ENCOUNTER — Ambulatory Visit (INDEPENDENT_AMBULATORY_CARE_PROVIDER_SITE_OTHER): Payer: Medicare Other | Admitting: Orthopaedic Surgery

## 2018-04-29 ENCOUNTER — Ambulatory Visit: Payer: Medicare Other | Admitting: Podiatry

## 2018-04-29 ENCOUNTER — Telehealth: Payer: Self-pay | Admitting: Cardiology

## 2018-04-29 DIAGNOSIS — N183 Chronic kidney disease, stage 3 (moderate): Secondary | ICD-10-CM | POA: Diagnosis not present

## 2018-04-29 DIAGNOSIS — I129 Hypertensive chronic kidney disease with stage 1 through stage 4 chronic kidney disease, or unspecified chronic kidney disease: Secondary | ICD-10-CM | POA: Diagnosis not present

## 2018-04-29 DIAGNOSIS — J449 Chronic obstructive pulmonary disease, unspecified: Secondary | ICD-10-CM

## 2018-04-29 DIAGNOSIS — E876 Hypokalemia: Secondary | ICD-10-CM | POA: Diagnosis not present

## 2018-04-29 DIAGNOSIS — M65322 Trigger finger, left index finger: Secondary | ICD-10-CM

## 2018-04-29 DIAGNOSIS — E559 Vitamin D deficiency, unspecified: Secondary | ICD-10-CM | POA: Diagnosis not present

## 2018-04-29 DIAGNOSIS — R911 Solitary pulmonary nodule: Secondary | ICD-10-CM

## 2018-04-29 DIAGNOSIS — M109 Gout, unspecified: Secondary | ICD-10-CM | POA: Diagnosis not present

## 2018-04-29 MED ORDER — BUPIVACAINE HCL 0.5 % IJ SOLN
0.3300 mL | INTRAMUSCULAR | Status: AC | PRN
  Administered 2018-04-29: .33 mL

## 2018-04-29 MED ORDER — METHYLPREDNISOLONE ACETATE 40 MG/ML IJ SUSP
13.3300 mg | INTRAMUSCULAR | Status: AC | PRN
  Administered 2018-04-29: 13.33 mg

## 2018-04-29 MED ORDER — LIDOCAINE HCL 1 % IJ SOLN
0.3000 mL | INTRAMUSCULAR | Status: AC | PRN
  Administered 2018-04-29: .3 mL

## 2018-04-29 NOTE — Telephone Encounter (Signed)
Spoke with pt on 3/31. She report she received virtual consent via mychart.

## 2018-04-29 NOTE — Telephone Encounter (Signed)
    Virtual Visit Pre-Appointment Phone Call  Steps For Call:  1. Confirm consent - "In the setting of the current Covid19 crisis, you are scheduled for a (phone or video) visit with your provider on (date) at (time).  Just as we do with many in-office visits, in order for you to participate in this visit, we must obtain consent.  If you'd like, I can send this to your mychart (if signed up) or email for you to review.  Otherwise, I can obtain your verbal consent now.  All virtual visits are billed to your insurance company just like a normal visit would be.  By agreeing to a virtual visit, we'd like you to understand that the technology does not allow for your provider to perform an examination, and thus may limit your provider's ability to fully assess your condition.  Finally, though the technology is pretty good, we cannot assure that it will always work on either your or our end, and in the setting of a video visit, we may have to convert it to a phone-only visit.  In either situation, we cannot ensure that we have a secure connection.  Are you willing to proceed?"  2. Give patient instructions for WebEx download to smartphone as below if video visit  3. Advise patient to be prepared with any vital sign or heart rhythm information, their current medicines, and a piece of paper and pen handy for any instructions they may receive the day of their visit  4. Inform patient they will receive a phone call 15 minutes prior to their appointment time (may be from unknown caller ID) so they should be prepared to answer  5. Confirm that appointment type is correct in Epic appointment notes (video vs telephone)    TELEPHONE CALL NOTE  [name] has been deemed a candidate for a follow-up tele-health visit to limit community exposure during the Covid-19 pandemic. I spoke with the patient via phone to ensure availability of phone/video source, confirm preferred email & phone number, and discuss  instructions and expectations.  I reminded AVARY EICHENBERGER to be prepared with any vital sign and/or heart rhythm information that could potentially be obtained via home monitoring, at the time of her visit. I reminded EMSLEY CUSTER to expect a phone call at the time of her visit if her visit.  Did the patient verbally acknowledge consent to treatment? Yes  Therisa Doyne 04/29/2018 1:37 PM

## 2018-04-29 NOTE — Progress Notes (Signed)
Office Visit Note   Patient: [name]           Date of Birth: 03-Dec-1965           MRN: [id number] Visit Date: 04/29/2018              Requested by: [name], MD [address] [place], [place] [address] PCP: [name], MD   Assessment & Plan: Visit Diagnoses:  1. Trigger index finger of left hand     Plan: Impression is left hand index trigger finger.  We injected this with cortisone today.  The patient is to keep a close eye on her blood sugar over the next week as she is a type II diabetic.  She will follow-up with [place] as needed.  Follow-Up Instructions: Return if symptoms worsen or fail to improve.   Orders:  No orders of the defined types were placed in this encounter.  No orders of the defined types were placed in this encounter.     Procedures: Hand/UE Inj: L index A1 for trigger finger on 04/29/2018 1:24 PM Indications: pain Details: 25 G needle Medications: 0.3 mL lidocaine 1 %; 0.33 mL bupivacaine 0.5 %; 13.33 mg methylPREDNISolone acetate 40 MG/ML Outcome: tolerated well, no immediate complications Consent was given by the patient. Patient was prepped and draped in the usual sterile fashion.       Clinical Data: No additional findings.   Subjective: Chief Complaint  Patient presents with  . Left Hand - Pain    HPI patient is a pleasant 53 year old female who presents our clinic today with continued pain and triggering to the left index finger.  She was seen in our office a little over a week ago for this.  She declined cortisone injection at that point.  She presents today requesting cortisone injection as she has changed her mind.  Review of Systems as detailed in HPI.  All others reviewed and are negative.   Objective: Vital Signs: LMP 04/10/2006   Physical Exam well-developed and well-nourished female in no acute distress.  Alert and oriented x3.  Ortho Exam examination of the left hand index finger reveals marked tenderness at the  A1 pulley and noticeable triggering.  Otherwise, normal exam  Specialty Comments:  No specialty comments available.  Imaging: No new imaging   PMFS History: Patient Active Problem List   Diagnosis Date Noted  . Paroxysmal SVT (supraventricular tachycardia) ([place]) 03/20/2018  . Pure hypercholesterolemia 03/20/2018  . Muscle cramping 02/25/2018  . Chest pressure 02/20/2018  . Nodule of upper lobe of right lung 02/13/2018  . Falls, subsequent encounter 01/30/2018  . Open wound of left foot 11/04/2017  . Essential hypertension 10/27/2017  . Solitary pulmonary nodule 10/07/2017  . Acute on chronic right-sided congestive heart failure ([place])   . Restrictive lung disease secondary to obesity 10/01/2017  . Polycythemia, secondary 10/01/2017  . Chronic viral hepatitis B without delta-agent ([place]) 07/08/2017  . COPD (chronic obstructive pulmonary disease) with chronic bronchitis ([place]) 06/27/2017  . Chronic kidney disease (CKD), stage IV (severe) ([place]) 01/14/2017  . Type 2 diabetes mellitus with stage 3 chronic kidney disease, with long-term current use of insulin ([place]) 12/12/2016  . Urge incontinence of urine 12/05/2016  . Trigger index finger of left hand 07/15/2016  . Class 3 severe obesity due to excess calories with serious comorbidity and body mass index (BMI) of 50.0 to 59.9 in adult ([place]) 10/02/2012  . Chronic diastolic heart failure ([place]) 04/07/2012  . GERD  01/26/2010  . Hypertension 08/04/2008  . Obstructive sleep apnea treated with BiPAP 02/03/2008  . FIBROCYSTIC BREAST DISEASE 10/28/2006  . Hyperlipidemia 03/27/2006  . Obesity hypoventilation syndrome ([place]) 03/27/2006  . Former tobacco use 03/27/2006  . Depression with anxiety 03/27/2006   Past Medical History:  Diagnosis Date  . Anxiety   . Arthritis 04-10-11   hips, shoulders, back  . Asthma   . Bipolar disorder ([place])   . Cancer ([place]) 1993   cervical, no treatment done, went away per pt  . Cervical dysplasia or atypia  04-10-11   '93- once dx.-got pregnant-no intervention, then postpartum, no dysplasia found  . CHF (congestive heart failure) ([place])    no cardiologist 2014 dx, none now  . Chronic kidney disease    stage 3 kidney disease  . 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 Pathology showed Condyloma, gluteal CONDYLOMA ACUMINATUM.   [name] Kitchen COPD (chronic obstructive pulmonary disease) ([place])   . Diabetes mellitus without complication ([place])   . Dyspnea    with actity, sitting  . Fall 12/16/2016  . Fibrocystic breast disease   . GERD (gastroesophageal reflux disease)   . Hepatitis    hep B-count is low at present,doesn't register  . Hypercalcemia   . Hyperlipidemia   . Hypertension   . Lung nodule 11/2017   right and left lung  . Migraine headache    none recent  . Morbid obesity ([place]) 03/27/2006  . Neuropathy   . Pneumonia    walking pneu 15 yrs. ago  . Skin lesion 03/15/2011   In gluteal crease now s/p removal by Dr. [name] of General Surgery on 3/12. Path shows condyloma.     . Sleep apnea 04-10-11   uses cpap, pt does not know settings  . Sleep apnea   . Trigger finger    left third    Family History  Problem Relation Age of Onset  . Pulmonary embolism Mother   . Stroke Father   . Alcohol abuse Father   . Pneumonia Father   . Cancer Maternal Aunt        breast  . Breast cancer Maternal Aunt   . Asthma Other   . COPD Other   . Hypertension Other   . Diabetes Other   . Asthma Sister   . Depression Sister   . Arthritis Sister   . Sickle cell anemia Son     Past Surgical History:  Procedure Laterality Date  . ANAL FISTULECTOMY  04/17/2011   Procedure: FISTULECTOMY ANAL;  Surgeon: Imogene Burn. [name], MD;  Location: [facility];  Service: General;  Laterality: N/A;  Excision of Condyloma Gluteal Cleft   . BACK SURGERY  04-10-11   x5-Lumbar fusion-retained hardware.(Dr. Louanne Skye)  . BREAST CYST EXCISION     bilateral breast, 3 cysts removed from each breast  . BREAST  EXCISIONAL BIOPSY Right    x 3  . BREAST EXCISIONAL BIOPSY Left    x 3  . CARPAL TUNNEL RELEASE Bilateral   . Cervical biospy    . COLONOSCOPY WITH PROPOFOL N/A 07/06/2015   Procedure: COLONOSCOPY WITH PROPOFOL;  Surgeon: [name], MD;  [facility];  Service: Endoscopy;  Laterality: N/A;  . DOPPLER ECHOCARDIOGRAPHY  04/06/2012   AT [place] 55-60%  . FRACTURE SURGERY    . I&D EXTREMITY Left 12/18/2016   Procedure: IRRIGATION AND DEBRIDEMENT LEFT FOOT, CLOSURE;  Surgeon: [name], MD;  Location: [facility];  Service: Orthopedics;  Laterality: Left;  [name] Kitchen MULTIPLE TOOTH EXTRACTIONS    . NECK SURGERY  04-10-11   x3- cervical fusion with plating and screws-Dr. [name]  . TEE WITHOUT CARDIOVERSION N/A 04/06/2012   Procedure: TRANSESOPHAGEAL ECHOCARDIOGRAM (TEE);  Surgeon: [name], MD;  [facility];  Service: Cardiovascular;  Laterality: N/A;  . TRIGGER FINGER RELEASE Right    middle finger  . TRIGGER FINGER RELEASE Left 06/28/2016   Procedure: RELEASE TRIGGER FINGER LEFT 3RD FINGER;  Surgeon: [name], MD;  Location: [facility];  Service: Orthopedics;  Laterality: Left;  . TRIGGER FINGER RELEASE Right 06/12/2016   Procedure: RIGHT INDEX FINGER TRIGGER RELEASE;  Surgeon: [name], MD;  Location: [facility];  Service: Orthopedics;  Laterality: Right;  . TRIGGER FINGER RELEASE Right 01/19/2018   Procedure: RIGHT RING FINGER TRIGGER FINGER RELEASE;  Surgeon: [name], MD;  Location: [facility];  Service: Orthopedics;  Laterality: Right;   Social History   Occupational History  . Not on file  Tobacco Use  . Smoking status: Former Smoker    Packs/day: 1.00    Years: 40.00    Pack years: 40.00    Types: Cigarettes    Start date: 01/28/1977    Last attempt to quit: 09/07/2017    Years since quitting: 0.6  . Smokeless tobacco: Never Used  . Tobacco comment: Quit prior to hospitalization using low dose Chantix  Substance and Sexual Activity  . Alcohol use: No     Alcohol/week: 0.0 standard drinks  . Drug use: Not Currently    Types: Marijuana, "Crack" cocaine    Comment: hx marijuana usemany years ago, Crack -none since 11/2015  . Sexual activity: Not Currently    Partners: Male    Birth control/protection: Post-menopausal

## 2018-04-30 ENCOUNTER — Encounter (INDEPENDENT_AMBULATORY_CARE_PROVIDER_SITE_OTHER): Payer: Self-pay | Admitting: Orthopaedic Surgery

## 2018-04-30 ENCOUNTER — Telehealth (INDEPENDENT_AMBULATORY_CARE_PROVIDER_SITE_OTHER): Payer: Medicare Other | Admitting: Cardiology

## 2018-04-30 ENCOUNTER — Other Ambulatory Visit: Payer: Self-pay

## 2018-04-30 VITALS — BP 147/75 | HR 69 | Ht 70.0 in | Wt >= 6400 oz

## 2018-04-30 DIAGNOSIS — R0602 Shortness of breath: Secondary | ICD-10-CM

## 2018-04-30 DIAGNOSIS — G894 Chronic pain syndrome: Secondary | ICD-10-CM | POA: Diagnosis not present

## 2018-04-30 DIAGNOSIS — Z79899 Other long term (current) drug therapy: Secondary | ICD-10-CM | POA: Diagnosis not present

## 2018-04-30 DIAGNOSIS — I5032 Chronic diastolic (congestive) heart failure: Secondary | ICD-10-CM | POA: Diagnosis not present

## 2018-04-30 DIAGNOSIS — M5030 Other cervical disc degeneration, unspecified cervical region: Secondary | ICD-10-CM | POA: Diagnosis not present

## 2018-04-30 DIAGNOSIS — M5136 Other intervertebral disc degeneration, lumbar region: Secondary | ICD-10-CM | POA: Diagnosis not present

## 2018-04-30 MED ORDER — POTASSIUM CHLORIDE CRYS ER 20 MEQ PO TBCR: 20.0000 meq | EXTENDED_RELEASE_TABLET | ORAL | 6 refills | Status: DC

## 2018-04-30 MED ORDER — METOLAZONE 2.5 MG PO TABS: 2.5000 mg | ORAL_TABLET | ORAL | 6 refills | Status: DC

## 2018-04-30 NOTE — Progress Notes (Signed)
Virtual Visit via Video Note    Evaluation Performed:  Follow-up visit  This visit type was conducted due to national recommendations for restrictions regarding the COVID-19 Pandemic (e.g. social distancing).  This format is felt to be most appropriate for this patient at this time.  All issues noted in this document were discussed and addressed.  No physical exam was performed (except for noted visual exam findings with Video Visits).  Please refer to the patient's chart (MyChart message for video visits and phone note for telephone visits) for the patient's consent to telehealth for [facility].  Date:  07/06/4852   ID:  [name], DOB [birth year], MRN [id number]  Patient Location:  [place], [place] [address]  Provider location:   Remote access with base in [place], [place] Office   PCP:  [name], MD  Cardiologist:  [name], MD   Chief Complaint:  Follow up  History of Present Illness:    [name] is a 53 y.o. female who presents via audio/video conferencing for a telehealth visit today. Done via WebEx.  Please see multiple prior notes for full review of her history.  Today: Discussed her weight trend. Highest 417 lbs 3/30, lowest 412 (yesterday). 413 today. Hasn't been 410 since several weeks ago, lowest in the last month was 405. Currently taking metolazone Mon/Thurs. Feels best when she is under 410. Has been cooking for herself with what she has while social distancing for coronavirus. Cooks smoked [place] wings with rice and low sodium cream of mushroom soup. Instructed on how to check labels and add up total sodium content.  No infectious symptoms. Breathing is stable. Denies chest pain. No syncope. Occasional palpitations.  Prior CV studies:   The following studies were reviewed today: Prior notes and studies  Past Medical History:  Diagnosis Date  . Anxiety   . Arthritis 04-10-11   hips, shoulders,  back  . Asthma   . Bipolar disorder ([place])   . Cancer ([place]) 1993   cervical, no treatment done, went away per pt  . Cervical dysplasia or atypia 04-10-11   '93- once dx.-got pregnant-no intervention, then postpartum, no dysplasia found  . CHF (congestive heart failure) ([place])    no cardiologist 2014 dx, none now  . Chronic kidney disease    stage 3 kidney disease  . 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 Pathology showed Condyloma, gluteal CONDYLOMA ACUMINATUM.   [name] Kitchen COPD (chronic obstructive pulmonary disease) ([place])   . Diabetes mellitus without complication ([place])   . Dyspnea    with actity, sitting  . Fall 12/16/2016  . Fibrocystic breast disease   . GERD (gastroesophageal reflux disease)   . Hepatitis    hep B-count is low at present,doesn't register  . Hypercalcemia   . Hyperlipidemia   . Hypertension   . Lung nodule 11/2017   right and left lung  . Migraine headache    none recent  . Morbid obesity ([place]) 03/27/2006  . Neuropathy   . Pneumonia    walking pneu 15 yrs. ago  . Skin lesion 03/15/2011   In gluteal crease now s/p removal by Dr. [name] of General Surgery on 3/12. Path shows condyloma.     . Sleep apnea 04-10-11   uses cpap, pt does not know settings  . Sleep apnea   . Trigger finger    left third   Past Surgical History:  Procedure Laterality Date  .  ANAL FISTULECTOMY  04/17/2011   Procedure: FISTULECTOMY ANAL;  Surgeon: Imogene Burn. [name], MD;  [facility];  Service: General;  Laterality: N/A;  Excision of Condyloma Gluteal Cleft   . BACK SURGERY  04-10-11   x5-Lumbar fusion-retained hardware.(Dr. Louanne Skye)  . BREAST CYST EXCISION     bilateral breast, 3 cysts removed from each breast  . BREAST EXCISIONAL BIOPSY Right    x 3  . BREAST EXCISIONAL BIOPSY Left    x 3  . CARPAL TUNNEL RELEASE Bilateral   . Cervical biospy    . COLONOSCOPY WITH PROPOFOL N/A 07/06/2015   Procedure: COLONOSCOPY WITH PROPOFOL;  Surgeon: [name], MD;   [facility];  Service: Endoscopy;  Laterality: N/A;  . DOPPLER ECHOCARDIOGRAPHY  04/06/2012   AT [place] 55-60%  . FRACTURE SURGERY    . I&D EXTREMITY Left 12/18/2016   Procedure: IRRIGATION AND DEBRIDEMENT LEFT FOOT, CLOSURE;  Surgeon: [name], MD;  Location: [facility];  Service: Orthopedics;  Laterality: Left;  [name] Kitchen MULTIPLE TOOTH EXTRACTIONS    . NECK SURGERY  04-10-11   x3- cervical fusion with plating and screws-Dr. [name]  . TEE WITHOUT CARDIOVERSION N/A 04/06/2012   Procedure: TRANSESOPHAGEAL ECHOCARDIOGRAM (TEE);  Surgeon: [name], MD;  Location: [facility]OSCOPY;  Service: Cardiovascular;  Laterality: N/A;  . TRIGGER FINGER RELEASE Right    middle finger  . TRIGGER FINGER RELEASE Left 06/28/2016   Procedure: RELEASE TRIGGER FINGER LEFT 3RD FINGER;  Surgeon: [name], MD;  Location: [facility];  Service: Orthopedics;  Laterality: Left;  . TRIGGER FINGER RELEASE Right 06/12/2016   Procedure: RIGHT INDEX FINGER TRIGGER RELEASE;  Surgeon: [name], MD;  Location: [facility];  Service: Orthopedics;  Laterality: Right;  . TRIGGER FINGER RELEASE Right 01/19/2018   Procedure: RIGHT RING FINGER TRIGGER FINGER RELEASE;  Surgeon: [name], MD;  Location: [facility];  Service: Orthopedics;  Laterality: Right;     No outpatient medications have been marked as taking for the 04/30/18 encounter (Telemedicine) with [name], MD.     Allergies:   Chantix [varenicline tartrate]; Corticosteroids; Methadone hcl; Aspirin; and Levofloxacin   Social History   Tobacco Use  . Smoking status: Former Smoker    Packs/day: 1.00    Years: 40.00    Pack years: 40.00    Types: Cigarettes    Start date: 01/28/1977    Last attempt to quit: 09/07/2017    Years since quitting: 0.6  . Smokeless tobacco: Never Used  . Tobacco comment: Quit prior to hospitalization using low dose Chantix  Substance Use Topics  . Alcohol use: No    Alcohol/week: 0.0 standard drinks  . Drug use:  Not Currently    Types: Marijuana, "Crack" cocaine    Comment: hx marijuana usemany years ago, Crack -none since 11/2015     Family Hx: The patient's family history includes Alcohol abuse in her father; Arthritis in her sister; Asthma in her sister and another family member; Breast cancer in her maternal aunt; COPD in an other family member; Cancer in her maternal aunt; Depression in her sister; Diabetes in an other family member; Hypertension in an other family member; Pneumonia in her father; Pulmonary embolism in her mother; Sickle cell anemia in her son; Stroke in her father.  ROS:   Please see the history of present illness.    All other systems reviewed and are negative.   Labs/Other Tests and Data Reviewed:    Recent Labs:  10/07/2017: Magnesium 1.9 01/27/2018: B Natriuretic Peptide 36.9; Hemoglobin 14.6; Platelets 344 02/23/2018: ALT 26 03/06/2018: BUN 36; Creatinine, Ser 1.58; Potassium 4.2; Sodium 145; TSH 0.469   Recent Lipid Panel Lab Results  Component Value Date/Time   CHOL 145 10/27/2017 09:44 AM   TRIG 98 10/27/2017 09:44 AM   HDL 55 10/27/2017 09:44 AM   CHOLHDL 2.6 10/27/2017 09:44 AM   CHOLHDL 4.0 04/17/2015 09:17 AM   LDLCALC 70 10/27/2017 09:44 AM   LDLDIRECT 114 (H) 09/15/2012 03:59 PM    Wt Readings from Last 3 Encounters:  04/30/18 (!) 413 lb (187.3 kg)  03/24/18 (!) 402 lb 12.8 oz (182.7 kg)  03/19/18 (!) 410 lb (186 kg)     Objective:    Vital Signs:  BP (!) 147/75   Pulse 69   Ht 5\' 10"  (1.778 m)   Wt (!) 413 lb (187.3 kg)   LMP 04/10/2006   BMI 59.26 kg/m    Well nourished, well developed female in no acute distress. Nasal cannula in place, breathing comfortably, able to speak in complete sentences without shortness of breath.  ASSESSMENT & PLAN:    1.  Chronic diastolic heart failure: more short of breath when weight >410 lbs. Suspect there may be a component of sodium-induced fluid retention given that she is distancing and cooking what  she has during coronavirus crisis -will trial changing metolazone to every other day, taking potassium when she takes metolazone, keeping lasix as ordered -will follow up closely, and she will call us with any issues.  COVID-19 Education: The signs and symptoms of COVID-19 were discussed with the patient and how to seek care for testing (follow up with PCP or arrange E-visit). The importance of social distancing was discussed today.  Patient Risk:   After full review of this patient's clinical status, I feel that they are at least moderate risk at this time.  Time:   Today, I have spent 17 minutes with the patient with telehealth technology discussing chronic diastolic heart failure and medication regimen.    Patient Instructions  Medication Instructions:  Take: Metalazone 2.5 mg every other day           Potassium 20 meq every other day    If you need a refill on your cardiac medications before your next appointment, please call your pharmacy.   Lab work: None  Testing/Procedures: None  Follow-Up: Your physician recommends that you schedule a follow-up virtual appointment in 3 weeks.    Medication Adjustments/Labs and Tests Ordered: Current medicines are reviewed at length with the patient today.  Concerns regarding medicines are outlined above.  Tests Ordered: No orders of the defined types were placed in this encounter.  Medication Changes: Meds ordered this encounter  Medications  . metolazone (ZAROXOLYN) 2.5 MG tablet    Sig: Take 1 tablet (2.5 mg total) by mouth every other day. TAKE 1 TABLET TWICE WEEKLY ON MONDAYS AND THURSDAYS ONLY    Dispense:  15 tablet    Refill:  6  . DISCONTD: potassium chloride SA (KLOR-CON M20) 20 MEQ tablet    Sig: Take 1 tablet (20 mEq total) by mouth every other day. Take 1 tablet once weekly on Monday's only    Dispense:  15 tablet    Refill:  6    Disposition:  Follow up in 3 week(s)  Signed, [name], MD   04/30/2018 9:20 AM    [facility]

## 2018-04-30 NOTE — Patient Instructions (Signed)
Medication Instructions:  Take: Metalazone 2.5 mg every other day           Potassium 20 meq every other day    If you need a refill on your cardiac medications before your next appointment, please call your pharmacy.   Lab work: None  Testing/Procedures: None  Follow-Up: Your physician recommends that you schedule a follow-up virtual appointment in 3 weeks.

## 2018-05-01 ENCOUNTER — Other Ambulatory Visit: Payer: Self-pay

## 2018-05-01 MED ORDER — POTASSIUM CHLORIDE CRYS ER 20 MEQ PO TBCR: 20.0000 meq | EXTENDED_RELEASE_TABLET | ORAL | 6 refills | Status: DC

## 2018-05-02 ENCOUNTER — Encounter: Payer: Self-pay | Admitting: Cardiology

## 2018-05-04 ENCOUNTER — Telehealth: Payer: Self-pay | Admitting: Adult Health

## 2018-05-04 NOTE — Telephone Encounter (Signed)
PCCM: Ok great. Make sure we have close follow up on the 3 month CT results.  She needs and appt to review and discuss next options  Garner Nash, DO [facility] 05/04/2018 6:35 PM

## 2018-05-04 NOTE — Telephone Encounter (Signed)
-----   Message from Ned Clines sent at 05/04/2018  3:09 PM EDT ----- Repeat scan in 3 months and then order CT guided needle bx OR pulmonary can go ahead and order CT needle bx now. Currently not a surgery candidate and doesn't need to come here.   Thx-Ryan Repeat scan in 3 months and then order CT guided needle bx OR pulmonary can go ahead and order CT needle bx now. Currently not a surgery candidate and doesn't need to come here.   Thx-Ryan  ----- Message ----- From: Laury Deep, RN Sent: 05/04/2018   2:20 PM EDT To: Ned Clines  Per EBG:   Repeat scan in 3 months and then order CT guided needle bx OR pulmonary can go ahead and order CT needle bx now.  Currently not a surgery candidate and doesn't need to come here.  Thx-Ryan ----- Message ----- From: Laury Deep, RN Sent: 05/01/2018  10:23 AM EDT To: Ned Clines  Sent to [facility] for review.  FYI, all docs are gone for the day except PVT so I may not get an answer until Monday. Thx-Ryan ----- Message ----- From: Ned Clines Sent: 05/01/2018  10:20 AM EDT To: Laury Deep, RN  Lung nodule, [id number] Demani Mcbrien, ct chest 3/24, pet 3/31 When can I add? thanks

## 2018-05-04 NOTE — Telephone Encounter (Signed)
Pt aware please set up CT chest in 3 months

## 2018-05-04 NOTE — Telephone Encounter (Signed)
Per Dr Valeta Harms. Referral to Thoracic surgery .  Recommendations below , pt not a surgical candidate.  Please call patient and let know that at this time would like to repeat CT chest .  That will set up for a repeat CT chest w/o contrast in 3 months with office visit to review .  At that time can decide on next step.

## 2018-05-05 ENCOUNTER — Encounter: Payer: Self-pay | Admitting: Dietician

## 2018-05-05 ENCOUNTER — Encounter: Payer: Medicare Other | Attending: General Surgery | Admitting: Dietician

## 2018-05-05 ENCOUNTER — Other Ambulatory Visit: Payer: Self-pay

## 2018-05-05 VITALS — Wt >= 6400 oz

## 2018-05-05 DIAGNOSIS — E785 Hyperlipidemia, unspecified: Secondary | ICD-10-CM | POA: Insufficient documentation

## 2018-05-05 DIAGNOSIS — I129 Hypertensive chronic kidney disease with stage 1 through stage 4 chronic kidney disease, or unspecified chronic kidney disease: Secondary | ICD-10-CM | POA: Insufficient documentation

## 2018-05-05 DIAGNOSIS — G4733 Obstructive sleep apnea (adult) (pediatric): Secondary | ICD-10-CM | POA: Diagnosis not present

## 2018-05-05 DIAGNOSIS — J9621 Acute and chronic respiratory failure with hypoxia: Secondary | ICD-10-CM | POA: Diagnosis not present

## 2018-05-05 DIAGNOSIS — K219 Gastro-esophageal reflux disease without esophagitis: Secondary | ICD-10-CM | POA: Insufficient documentation

## 2018-05-05 DIAGNOSIS — M5090 Cervical disc disorder, unspecified, unspecified cervical region: Secondary | ICD-10-CM | POA: Diagnosis not present

## 2018-05-05 DIAGNOSIS — E1122 Type 2 diabetes mellitus with diabetic chronic kidney disease: Secondary | ICD-10-CM | POA: Insufficient documentation

## 2018-05-05 DIAGNOSIS — N184 Chronic kidney disease, stage 4 (severe): Secondary | ICD-10-CM | POA: Insufficient documentation

## 2018-05-05 DIAGNOSIS — J449 Chronic obstructive pulmonary disease, unspecified: Secondary | ICD-10-CM | POA: Insufficient documentation

## 2018-05-05 DIAGNOSIS — Z713 Dietary counseling and surveillance: Secondary | ICD-10-CM | POA: Insufficient documentation

## 2018-05-05 DIAGNOSIS — R911 Solitary pulmonary nodule: Secondary | ICD-10-CM

## 2018-05-05 DIAGNOSIS — E669 Obesity, unspecified: Secondary | ICD-10-CM | POA: Insufficient documentation

## 2018-05-05 DIAGNOSIS — Z6841 Body Mass Index (BMI) 40.0 and over, adult: Secondary | ICD-10-CM | POA: Insufficient documentation

## 2018-05-05 DIAGNOSIS — F418 Other specified anxiety disorders: Secondary | ICD-10-CM | POA: Insufficient documentation

## 2018-05-05 NOTE — Progress Notes (Signed)
Bariatric Supervised Weight Loss Visit Appt Start Time: 10:50am  End Time: 11:15am  Telephone Visit  Planned Surgery: RYGB   3rd out of 5 SWL Appointments   Pt requires wheelchair for transportation to and from her vehicle.   NUTRITION ASSESSMENT  Anthropometrics  Start weight at [facility]: 405.6 lbs (01/15/2018) Today's weight: 405.2 lbs (pt reported from home) Weight change: -5.5 lbs since previous visit on 03/17/2018 BMI: 58.14 kg/m2    Clinical  Medications: see list  Supplements: MVI (new: added omega-3)  Psychosocial/Lifestyle Pt lives alone, and her son performs the food shopping for her. Pt's ability to walk/move is restricted dt health conditions. Pt is talkative.  24-Hr Dietary Recall First Meal: Slim Fast shake  Snack: 2 small bananas   Second Meal: alfredo pasta + hamburger patty + string beans + corn Snack: 2 small bananas  Third Meal: alfredo pasta + hamburger patty + string beans + corn  Snack: sugar-free ice cream Beverages: water, kool-aid + sugar, diet Mtn. Dew  Food & Nutrition Related Hx Dietary Hx: Pt states she usually fixes a lot of food at one time then eats it for several meals until it is gone. Pt states she has reduced the amount of sugar she consumes. Pt states she has not had tea in a long time. Pt states she is going to use organic sugar to sweeten things. Pt states she avoids dairy products, butter, olive oil, and heavy greasy foods to avoid heartburn. Pt states she plans to try skinny pop popcorn instead of regular popcorn. Pt states she went to the grocery store and used the electric scooter to get around. Pt states she likes baked chicken, chicken wings, and smoked [place]. Likes bananas, oranges, and apples.  Estimated Daily Fluid Intake: <2 L (on fluid restriction)  GI / Other Notable Symptoms: none   Physical Activity  Current average weekly physical activity: walks around some in her house (states she got a prescription for a walker)      Estimated Energy Needs Calories: 1600 Carbohydrate: 180g Protein: 120g Fat: 44g  Progress Towards Pre-Op Goals Previously Chosen . Pt has downloaded MyFitness Pal.  . Making better food choices, focusing on keeping grams of fat and sugar 9 or less per serving with the exception of kool-aid.   . Pt has found she likes Art gallery manager Fast shakes.  . Does not drink with meals.  . Taking MVI and omega-3 supplements.    NUTRITION DIAGNOSIS  Overweight/obesity ([place]-3.3) related to past poor dietary habits and physical inactivity as evidenced by patient w/ planned RYGB surgery following dietary guidelines for continued weight loss.    NUTRITION INTERVENTION  Nutrition counseling (C-1) and education (E-2) to facilitate bariatric surgery goals.  New Pre-Op Goals to Work On . Track food and beverage intake  . Physical activity  . Balanced snacks  . Sugar-sweetened beverages     Learning Style & Readiness for Change Teaching method utilized: Visual & Auditory  Demonstrated degree of understanding via: Teach Back  Barriers to learning/adherence to lifestyle change: multiple health conditions limit ability to be active, contemplative stage of change   MONITORING & EVALUATION Dietary intake, weekly physical activity, body weight, and pre-op goals in 1 month.   Next Steps  Notes for next SWL Visit:   Assess progress made on Pre-Op Goals discussed thus far.   Patient is to return to [facility] in 1 month for 4th SWL Visit.

## 2018-05-05 NOTE — Patient Instructions (Addendum)
   Try to walk at least 15 to 30 minutes every day.   Start using MyFitness Pal to track your food intake.   With each snack, include 1 piece of fruit and pair it with either a hard boiled egg, handful of nuts, or other protein.   Continue to cut back on sugar intake by adding less sugar to kool-aid and by using other drinks that have no added sugar. And keep drinking lots of water!

## 2018-05-05 NOTE — Telephone Encounter (Signed)
Order has been placed. Patient is already on a recall for Dr. Valeta Harms in July with PFTs.

## 2018-05-07 DIAGNOSIS — G4733 Obstructive sleep apnea (adult) (pediatric): Secondary | ICD-10-CM | POA: Diagnosis not present

## 2018-05-07 DIAGNOSIS — J9621 Acute and chronic respiratory failure with hypoxia: Secondary | ICD-10-CM | POA: Diagnosis not present

## 2018-05-07 DIAGNOSIS — M5090 Cervical disc disorder, unspecified, unspecified cervical region: Secondary | ICD-10-CM | POA: Diagnosis not present

## 2018-05-14 ENCOUNTER — Telehealth: Payer: Self-pay

## 2018-05-14 NOTE — Telephone Encounter (Signed)
Pt scheduled for 2 week f/u with Dr. Harrell Gave on 05/18/18 at 45 am. Consent previously obtained for tele-visit on 04/29/18.

## 2018-05-15 ENCOUNTER — Telehealth: Payer: Self-pay | Admitting: Student in an Organized Health Care Education/Training Program

## 2018-05-15 ENCOUNTER — Telehealth: Payer: Self-pay | Admitting: Cardiology

## 2018-05-15 DIAGNOSIS — N183 Chronic kidney disease, stage 3 unspecified: Secondary | ICD-10-CM

## 2018-05-15 DIAGNOSIS — E1122 Type 2 diabetes mellitus with diabetic chronic kidney disease: Secondary | ICD-10-CM

## 2018-05-15 DIAGNOSIS — Z794 Long term (current) use of insulin: Secondary | ICD-10-CM

## 2018-05-15 MED ORDER — INSULIN GLARGINE 100 UNIT/ML SOLOSTAR PEN: 12.0000 [IU] | PEN_INJECTOR | Freq: Every day | SUBCUTANEOUS | 3 refills | Status: DC

## 2018-05-15 MED ORDER — INSULIN LISPRO (1 UNIT DIAL) 100 UNIT/ML (KWIKPEN): 4.0000 [IU] | PEN_INJECTOR | Freq: Three times a day (TID) | SUBCUTANEOUS | 3 refills | Status: DC

## 2018-05-15 NOTE — Telephone Encounter (Signed)
Smartphone/  My chart/ virtual consent/ pre reg completed

## 2018-05-15 NOTE — Telephone Encounter (Signed)
April 1st she had trigger finger procedure without "steroids"  She reported 394 on 4/13 This AM it was 327 And 278 at ~ 2:00 PM today.   Yesterday 312, 293, 255  Finger pain is much improved over the last few days.  Still taking 5/ 325mg  hydrocodone /APAP Q 6 hours.   Discussed insulin regimen and agreed to make changes.  Increased Lantus from 10 units QAM to 12 units in the AM (starting tomorrow).  Increased mealtime Humalog from 3 units to 4 units prior to meals 3 times daily.   Reviewed hypoglycemia management and need to call if blood sugars are < 100 routinely  Follow up planned by phone next week.

## 2018-05-15 NOTE — Telephone Encounter (Signed)
Pt requesting Koval to call her back concerning her sugars. She says her sugar has been running high. Please call pt back to discuss.    Sidenote: When pt first called she wanted an appt with Burr Medico, offered her an appt with another doctor because we can't guarantee  she will see Burr Medico. Pt then says she will either see Burr Medico or Criss Rosales but nobody else. Explained our policies right now with COVID and offered to schedule with another doctor again and she said absolutely not and asked Koval to call her.

## 2018-05-16 NOTE — Telephone Encounter (Signed)
Noted and agree. 

## 2018-05-18 ENCOUNTER — Encounter: Payer: Self-pay | Admitting: Cardiology

## 2018-05-18 ENCOUNTER — Telehealth (INDEPENDENT_AMBULATORY_CARE_PROVIDER_SITE_OTHER): Payer: Medicare Other | Admitting: Cardiology

## 2018-05-18 VITALS — BP 161/81 | HR 85 | Ht 70.0 in | Wt >= 6400 oz

## 2018-05-18 DIAGNOSIS — N183 Chronic kidney disease, stage 3 unspecified: Secondary | ICD-10-CM

## 2018-05-18 DIAGNOSIS — E7800 Pure hypercholesterolemia, unspecified: Secondary | ICD-10-CM

## 2018-05-18 DIAGNOSIS — E1122 Type 2 diabetes mellitus with diabetic chronic kidney disease: Secondary | ICD-10-CM

## 2018-05-18 DIAGNOSIS — I10 Essential (primary) hypertension: Secondary | ICD-10-CM

## 2018-05-18 DIAGNOSIS — I471 Supraventricular tachycardia: Secondary | ICD-10-CM

## 2018-05-18 DIAGNOSIS — I5032 Chronic diastolic (congestive) heart failure: Secondary | ICD-10-CM | POA: Diagnosis not present

## 2018-05-18 DIAGNOSIS — Z794 Long term (current) use of insulin: Secondary | ICD-10-CM

## 2018-05-18 MED ORDER — METOLAZONE 2.5 MG PO TABS: 2.5000 mg | ORAL_TABLET | ORAL | 6 refills | Status: DC

## 2018-05-18 MED ORDER — POTASSIUM CHLORIDE CRYS ER 20 MEQ PO TBCR: 20.0000 meq | EXTENDED_RELEASE_TABLET | ORAL | 6 refills | Status: DC

## 2018-05-18 NOTE — Progress Notes (Signed)
Virtual Visit via Video Note   This visit type was conducted due to national recommendations for restrictions regarding the COVID-19 Pandemic (e.g. social distancing) in an effort to limit this patient's exposure and mitigate transmission in our community.  Due to her co-morbid illnesses, this patient is at least at moderate risk for complications without adequate follow up.  This format is felt to be most appropriate for this patient at this time.  All issues noted in this document were discussed and addressed.  A limited physical exam was performed with this format.  Please refer to the patient's chart for her consent to telehealth for [facility].   Evaluation Performed:  Follow-up visit  Date:  5/44/9201   ID:  [name], DOB [birth year], MRN [id number]  Patient Location: Home Provider Location: Home  PCP:  [name], MD  Cardiologist:  [name], MD  Electrophysiologist:  None   Chief Complaint:  Follow up  History of Present Illness:    [name] is a 53 y.o. female with chronic diastolic heart failure, chronic hypoxic respiratory failure, COPD, OSA on PAP, morbid obesity, paroxysmal SVT, type II diabetes on insulin, CKD stage 3, hypertension, hyperlipidemia  who is seen via video visit today for follow up.  The patient does not have symptoms concerning for COVID-19 infection (fever, chills, cough, or new shortness of breath).   Overall feeling much better with lower weights. She was taking the metolazone every other day up until last week. Weight went down considerably, lowest 395 but on average around 399-400 lbs. No metolazone since 4/16, and weight today 403. No breathing crises. No palpitations. Feels that when her weight is down, her chest is actually lighter/less tight if she takes the O2 off for a bit. Taking all other meds as prescribed.  She got a trigger finger injection earlier in the month, and she notes that both her blood pressure and  blood sugars have been widely variable since then. Her highest reading was 197/73 on 4/15. BP reading was the lowest yesterday morning at 139/80. Took all meds this am, though not long ago. No symptoms with high readings, feels that her BP is gradually trending down.   She is working on diet, trying to get to her first goal weight of 346 lbs. Working on portion control, healthy foods.  Denies PND, orthopnea. Gets swelling in her abdomen when her weight is up, had none while on every other day metolazone, today has very small amount. No syncope or palpitations.  Past Medical History:  Diagnosis Date  . Anxiety   . Arthritis 04-10-11   hips, shoulders, back  . Asthma   . Bipolar disorder ([place])   . Cancer ([place]) 1993   cervical, no treatment done, went away per pt  . Cervical dysplasia or atypia 04-10-11   '93- once dx.-got pregnant-no intervention, then postpartum, no dysplasia found  . CHF (congestive heart failure) ([place])    no cardiologist 2014 dx, none now  . Chronic kidney disease    stage 3 kidney disease  . 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 Pathology showed Condyloma, gluteal CONDYLOMA ACUMINATUM.   [name] Kitchen COPD (chronic obstructive pulmonary disease) ([place])   . Diabetes mellitus without complication ([place])   . Dyspnea    with actity, sitting  . Fall 12/16/2016  . Fibrocystic breast disease   . GERD (gastroesophageal reflux disease)   . Hepatitis    hep B-count is low at present,doesn't register  .  Hypercalcemia   . Hyperlipidemia   . Hypertension   . Lung nodule 11/2017   right and left lung  . Migraine headache    none recent  . Morbid obesity ([place]) 03/27/2006  . Neuropathy   . Pneumonia    walking pneu 15 yrs. ago  . Skin lesion 03/15/2011   In gluteal crease now s/p removal by Dr. [name] of General Surgery on 3/12. Path shows condyloma.     . Sleep apnea 04-10-11   uses cpap, pt does not know settings  . Sleep apnea   . Trigger finger    left  third   Past Surgical History:  Procedure Laterality Date  . ANAL FISTULECTOMY  04/17/2011   Procedure: FISTULECTOMY ANAL;  Surgeon: Imogene Burn. [name], MD;  Location: [facility];  Service: General;  Laterality: N/A;  Excision of Condyloma Gluteal Cleft   . BACK SURGERY  04-10-11   x5-Lumbar fusion-retained hardware.(Dr. Louanne Skye)  . BREAST CYST EXCISION     bilateral breast, 3 cysts removed from each breast  . BREAST EXCISIONAL BIOPSY Right    x 3  . BREAST EXCISIONAL BIOPSY Left    x 3  . CARPAL TUNNEL RELEASE Bilateral   . Cervical biospy    . COLONOSCOPY WITH PROPOFOL N/A 07/06/2015   Procedure: COLONOSCOPY WITH PROPOFOL;  Surgeon: [name], MD;  Location: [facility];  Service: Endoscopy;  Laterality: N/A;  . DOPPLER ECHOCARDIOGRAPHY  04/06/2012   AT [facility] 55-60%  . FRACTURE SURGERY    . I&D EXTREMITY Left 12/18/2016   Procedure: IRRIGATION AND DEBRIDEMENT LEFT FOOT, CLOSURE;  Surgeon: [name], MD;  Location: [place];  Service: Orthopedics;  Laterality: Left;  [name] Kitchen MULTIPLE TOOTH EXTRACTIONS    . NECK SURGERY  04-10-11   x3- cervical fusion with plating and screws-Dr. [name]  . TEE WITHOUT CARDIOVERSION N/A 04/06/2012   Procedure: TRANSESOPHAGEAL ECHOCARDIOGRAM (TEE);  Surgeon: [name], MD;  Location: [facility] ENDOSCOPY;  Service: Cardiovascular;  Laterality: N/A;  . TRIGGER FINGER RELEASE Right    middle finger  . TRIGGER FINGER RELEASE Left 06/28/2016   Procedure: RELEASE TRIGGER FINGER LEFT 3RD FINGER;  Surgeon: [name], MD;  Location: [place];  Service: Orthopedics;  Laterality: Left;  . TRIGGER FINGER RELEASE Right 06/12/2016   Procedure: RIGHT INDEX FINGER TRIGGER RELEASE;  Surgeon: [name], MD;  Location: [place];  Service: Orthopedics;  Laterality: Right;  . TRIGGER FINGER RELEASE Right 01/19/2018   Procedure: RIGHT RING FINGER TRIGGER FINGER RELEASE;  Surgeon: [name], MD;  Location: [place];  Service: Orthopedics;  Laterality: Right;     Current Meds   Medication Sig  . ACCU-CHEK AVIVA PLUS test strip TEST 4 TIMES DAILY  . ACCU-CHEK SOFTCLIX LANCETS lancets TEST 4 TIMES DAILY  . allopurinol (ZYLOPRIM) 100 MG tablet Take 100 mg by mouth daily.  [name] Kitchen ammonium lactate (AMLACTIN) 12 % lotion Apply 1 application topically 2 (two) times daily as needed for dry skin.  [name] Kitchen atorvastatin (LIPITOR) 40 MG tablet Take 1 tablet (40 mg total) by mouth daily.  . baclofen (LIORESAL) 10 MG tablet Take 1 tablet (10 mg total) by mouth 3 (three) times daily as needed for muscle spasms.  . blood glucose meter kit and supplies KIT Dispense based on patient and insurance preference. Use up to four times daily as directed. (FOR ICD-9 250.00, 250.01).  . busPIRone (BUSPAR) 10 MG tablet Take 1 tablet (10 mg total) by mouth 3 (three)  times daily.  . Capsaicin-Menthol (PAIN RELIEF EX) Apply 1 application topically 3 (three) times daily as needed (shoulder pain).  . citalopram (CELEXA) 40 MG tablet TAKE 1 TABLET BY MOUTH EVERY DAY (Patient taking differently: Take 40 mg by mouth every morning. )  . diclofenac sodium (VOLTAREN) 1 % GEL APPLY 2 GRAMS TO AFFECTED AREA 4 TIMES A DAY  . diltiazem (CARDIZEM CD) 120 MG 24 hr capsule Take 1 capsule (120 mg total) by mouth 2 (two) times daily.  [name] Kitchen docusate sodium (COLACE) 100 MG capsule Take 200 mg by mouth 2 (two) times daily.  . fluticasone (FLONASE) 50 MCG/ACT nasal spray Place 2 sprays into both nostrils 2 (two) times daily.  . Fluticasone-Umeclidin-Vilant (TRELEGY ELLIPTA) 100-62.5-25 MCG/INH AEPB Inhale 1 puff into the lungs daily.  . furosemide (LASIX) 40 MG tablet Take 2 tablets (80 mg total) by mouth 2 (two) times daily.  [name] Kitchen gabapentin (NEURONTIN) 300 MG capsule TAKE 2 CAPSULES (600 MG TOTAL) BY MOUTH 3 (THREE) TIMES DAILY.  [name] Kitchen HYDROcodone-acetaminophen (NORCO) 7.5-325 MG tablet Take 1 tablet by mouth every 6 (six) hours as needed for moderate pain.  . hydrOXYzine (VISTARIL) 100 MG capsule Take 1 capsule (100 mg total) by mouth 3  (three) times daily as needed for itching.  . Insulin Glargine (LANTUS SOLOSTAR) 100 UNIT/ML Solostar Pen Inject 12 Units into the skin daily before breakfast.  . insulin lispro (HUMALOG KWIKPEN) 100 UNIT/ML KwikPen Inject 0.04 mLs (4 Units total) into the skin 3 (three) times daily before meals.  . Insulin Pen Needle (B-D UF III MINI PEN NEEDLES) 31G X 5 MM MISC CHECK SUGARS 4 TIMES A DAY BEFORE MEALS AND AT BEDTIME.  [name] Kitchen JARDIANCE 25 MG TABS tablet TAKE 1 TABLET BY MOUTH EVERY DAY (Patient taking differently: Take 25 mg by mouth every morning. Give in the morning with or without food.)  . ketoconazole (NIZORAL) 2 % cream Apply 1 fingertip amount to each foot daily.  [name] Kitchen loratadine (CLARITIN) 10 MG tablet Take 1 tablet (10 mg total) by mouth daily.  [name] Kitchen losartan (COZAAR) 100 MG tablet Take 1 tablet (100 mg total) by mouth daily.  . metolazone (ZAROXOLYN) 2.5 MG tablet Take 1 tablet (2.5 mg total) by mouth 2 (two) times a week. TAKE 1 TABLET TWICE WEEKLY ON MONDAYS AND THURSDAYS ONLY  . montelukast (SINGULAIR) 10 MG tablet TAKE 1 TABLET AT BEDTIME  . Multiple Vitamins-Minerals (MULTIVITAMIN WITH MINERALS) tablet Take 1 tablet by mouth daily.  . mupirocin ointment (BACTROBAN) 2 % APPLY TO AFFECTED AREA TWICE A DAY  . NARCAN 4 MG/0.1ML LIQD nasal spray kit   . NON FORMULARY Uses a C-PAP at bedtime  . nystatin (MYCOSTATIN) 100000 UNIT/ML suspension Take 5 mLs (500,000 Units total) by mouth 4 (four) times daily. Swish and swallow. (Patient taking differently: Take 5 mLs by mouth daily as needed (mouth sores). Swish and swallow.)  . omeprazole (PRILOSEC) 20 MG capsule Take 1 capsule (20 mg total) by mouth daily as needed.  [name] Kitchen oxybutynin (DITROPAN XL) 10 MG 24 hr tablet Take 1 tablet (10 mg total) by mouth at bedtime.  . OXYGEN Inhale 2 L into the lungs continuous.  [name] Kitchen PROAIR HFA 108 (90 Base) MCG/ACT inhaler INHALE 2 PUFFS INTO THE LUNGS EVERY 6 (SIX) HOURS AS NEEDED FOR SHORTNESS OF BREATH.  . promethazine  (PHENERGAN) 25 MG tablet Take 1 tablet (25 mg total) by mouth every 6 (six) hours as needed for nausea.  . Vitamin D, Ergocalciferol, (DRISDOL) 50000 units CAPS  capsule Take 50,000 Units by mouth daily. For 30 days  . [DISCONTINUED] metolazone (ZAROXOLYN) 2.5 MG tablet Take 1 tablet (2.5 mg total) by mouth every other day. TAKE 1 TABLET TWICE WEEKLY ON MONDAYS AND THURSDAYS ONLY (Patient taking differently: Take 2.5 mg by mouth every other day. )     Allergies:   Chantix [varenicline tartrate]; Corticosteroids; Methadone hcl; Aspirin; and Levofloxacin   Social History   Tobacco Use  . Smoking status: Former Smoker    Packs/day: 1.00    Years: 40.00    Pack years: 40.00    Types: Cigarettes    Start date: 01/28/1977    Last attempt to quit: 09/07/2017    Years since quitting: 0.6  . Smokeless tobacco: Never Used  . Tobacco comment: Quit prior to hospitalization using low dose Chantix  Substance Use Topics  . Alcohol use: No    Alcohol/week: 0.0 standard drinks  . Drug use: Not Currently    Types: Marijuana, "Crack" cocaine    Comment: hx marijuana usemany years ago, Crack -none since 11/2015     Family Hx: The patient's family history includes Alcohol abuse in her father; Arthritis in her sister; Asthma in her sister and another family member; Breast cancer in her maternal aunt; COPD in an other family member; Cancer in her maternal aunt; Depression in her sister; Diabetes in an other family member; Hypertension in an other family member; Pneumonia in her father; Pulmonary embolism in her mother; Sickle cell anemia in her son; Stroke in her father.  ROS:   Please see the history of present illness.    All other systems reviewed and are negative.   Prior CV studies:   The following studies were reviewed today: Monitor, stress test, echo 2019  Labs/Other Tests and Data Reviewed:    EKG:  An ECG dated 03/06/18 was personally reviewed today and demonstrated:  nsr  Recent Labs:  10/07/2017: Magnesium 1.9 01/27/2018: B Natriuretic Peptide 36.9; Hemoglobin 14.6; Platelets 344 02/23/2018: ALT 26 03/06/2018: BUN 36; Creatinine, Ser 1.58; Potassium 4.2; Sodium 145; TSH 0.469   Recent Lipid Panel Lab Results  Component Value Date/Time   CHOL 145 10/27/2017 09:44 AM   TRIG 98 10/27/2017 09:44 AM   HDL 55 10/27/2017 09:44 AM   CHOLHDL 2.6 10/27/2017 09:44 AM   CHOLHDL 4.0 04/17/2015 09:17 AM   LDLCALC 70 10/27/2017 09:44 AM   LDLDIRECT 114 (H) 09/15/2012 03:59 PM    Wt Readings from Last 3 Encounters:  05/18/18 (!) 403 lb 3.2 oz (182.9 kg)  05/05/18 (!) 405 lb 3.2 oz (183.8 kg)  04/30/18 (!) 413 lb (187.3 kg)     Objective:    Vital Signs:  BP (!) 161/81   Pulse 85   Ht _0  (1.778 m)   Wt (!) 403 lb 3.2 oz (182.9 kg)   LMP 04/10/2006   BMI 57.85 kg/m    VITAL SIGNS:  reviewed GEN:  no acute distress EYES:  sclerae anicteric, EOMI - Extraocular Movements Intact RESPIRATORY:  normal respiratory effort, symmetric expansion CARDIOVASCULAR:  no JVD appreciated via video SKIN:  warm and dry MUSCULOSKELETAL:  ambulating independently, moving all 4 limbs NEURO:  alert and oriented x 3, no obvious focal deficit PSYCH:  normal affect  ASSESSMENT & PLAN:    Chronic diastolic heart failure (HCC)  Essential hypertension  Paroxysmal SVT (supraventricular tachycardia) (HCC)  Type 2 diabetes mellitus with stage 3 chronic kidney disease, with long-term current use of insulin (HCC)  Pure hypercholesterolemia  Hypertension  Chronic diastolic heart failure: feeling much better with weights <400. -returning metolazone to Monday/Thursday dosing, take potassium when she takes metolazone -continue furosemide as ordered -working on diet, weight loss -daily weights -she will call us with any issues.  Paroxysmal SVT: no symptoms -continue diltiazem  Type II diabetes with stage 3 CKD: on insulin and jardiance -per PCP, working on diet and weight loss, which  we discussed today  Hypertension: elevated today, has had fluctuations post trigger finger injection, asymptomatic -she will continue to monitor. Goal <130/80. She feels it is gradually improving, but if BP remains elevated, she will call us -continue dilitazem, diuretics, losartan  Hypercholesterolemia: continue atorvastatin  COVID-19 Education: The signs and symptoms of COVID-19 were discussed with the patient and how to seek care for testing (follow up with PCP or arrange E-visit).  The importance of social distancing was discussed today.  Time:   Today, I have spent 16 minutes with the patient with telehealth technology discussing the above problems.    Patient Instructions  Medication Instructions:  Decrease: Metolazone 2.5 mg and Potassium 20 meq to twice weekly ( Monday and Thursday  If you need a refill on your cardiac medications before your next appointment, please call your pharmacy.   Lab work: None  Testing/Procedures: None  Follow-Up: At [facility], you and your health needs are our priority.  As part of our continuing mission to provide you with exceptional heart care, we have created designated Provider Care Teams.  These Care Teams include your primary Cardiologist (physician) and Advanced Practice Providers (APPs -  Physician Assistants and Nurse Practitioners) who all work together to provide you with the care you need, when you need it. You will need a follow up appointment in 6 weeks.  Please call our office 2 months in advance to schedule this appointment.  You may see [name], MD or one of the following Advanced Practice Providers on your designated Care Team:   Rosaria Ferries, PA-C . Jory Sims, DNP, ANP       Medication Adjustments/Labs and Tests Ordered: Current medicines are reviewed at length with the patient today.  Concerns regarding medicines are outlined above.   Tests Ordered: No orders of the defined types were placed  in this encounter.   Medication Changes: Meds ordered this encounter  Medications  . metolazone (ZAROXOLYN) 2.5 MG tablet    Sig: Take 1 tablet (2.5 mg total) by mouth 2 (two) times a week. TAKE 1 TABLET TWICE WEEKLY ON MONDAYS AND THURSDAYS ONLY    Dispense:  15 tablet    Refill:  6  . potassium chloride SA (KLOR-CON M20) 20 MEQ tablet    Sig: Take 1 tablet (20 mEq total) by mouth 2 (two) times a week. Take 1 tablet twice weekly on Monday and Thursday.    Dispense:  15 tablet    Refill:  6    Disposition:  Follow up in 6 week(s)  Signed, [name], MD  05/18/2018 8:58 AM    [facility]

## 2018-05-18 NOTE — Patient Instructions (Addendum)
Medication Instructions:  Decrease: Metolazone 2.5 mg and Potassium 20 meq to twice weekly ( Monday and Thursday)  If you need a refill on your cardiac medications before your next appointment, please call your pharmacy.   Lab work: None  Testing/Procedures: None  Follow-Up: At [facility], you and your health needs are our priority.  As part of our continuing mission to provide you with exceptional heart care, we have created designated Provider Care Teams.  These Care Teams include your primary Cardiologist (physician) and Advanced Practice Providers (APPs -  Physician Assistants and Nurse Practitioners) who all work together to provide you with the care you need, when you need it. You will need a follow up appointment in 6 weeks.  Please call our office 2 months in advance to schedule this appointment.  You may see [name], MD or one of the following Advanced Practice Providers on your designated Care Team:   Rosaria Ferries, PA-C . Jory Sims, DNP, ANP

## 2018-05-19 ENCOUNTER — Telehealth: Payer: Self-pay | Admitting: Pharmacist

## 2018-05-19 DIAGNOSIS — E1122 Type 2 diabetes mellitus with diabetic chronic kidney disease: Secondary | ICD-10-CM

## 2018-05-19 DIAGNOSIS — N183 Chronic kidney disease, stage 3 unspecified: Secondary | ICD-10-CM

## 2018-05-19 DIAGNOSIS — Z794 Long term (current) use of insulin: Secondary | ICD-10-CM

## 2018-05-19 MED ORDER — INSULIN GLARGINE 100 UNIT/ML SOLOSTAR PEN: 15.0000 [IU] | PEN_INJECTOR | Freq: Every day | SUBCUTANEOUS | 3 refills | Status: DC

## 2018-05-19 NOTE — Telephone Encounter (Signed)
I reviewed and agree with Dr. Graylin Shiver management.

## 2018-05-19 NOTE — Telephone Encounter (Signed)
Out of strips. But has readings consistently > 200 despite recent increase in insulin.   Patient admits she is eating more fruits; apples, oranges, bananas and grapes.  Advised to limit intake of fruits.  She states her dry mouth is contributing to her eating more often.   Denies any readings < 200s since last contact.   We agreed that an increase in her long-acting insulin (lantus - glargine) would be appropriate at this time.  Increased once daily dose from 12 to 15 each morning.  Plan to follow up in 4-6 days for reevaluation.

## 2018-05-20 ENCOUNTER — Ambulatory Visit: Payer: Medicare Other | Admitting: Pulmonary Disease

## 2018-05-21 ENCOUNTER — Other Ambulatory Visit: Payer: Self-pay | Admitting: *Deleted

## 2018-05-21 DIAGNOSIS — J302 Other seasonal allergic rhinitis: Secondary | ICD-10-CM

## 2018-05-22 ENCOUNTER — Telehealth: Payer: Self-pay | Admitting: Pharmacist

## 2018-05-22 ENCOUNTER — Other Ambulatory Visit: Payer: Self-pay | Admitting: Family Medicine

## 2018-05-22 ENCOUNTER — Encounter: Payer: Self-pay | Admitting: Student in an Organized Health Care Education/Training Program

## 2018-05-22 DIAGNOSIS — N183 Chronic kidney disease, stage 3 unspecified: Secondary | ICD-10-CM

## 2018-05-22 DIAGNOSIS — F418 Other specified anxiety disorders: Secondary | ICD-10-CM

## 2018-05-22 DIAGNOSIS — E1122 Type 2 diabetes mellitus with diabetic chronic kidney disease: Secondary | ICD-10-CM

## 2018-05-22 DIAGNOSIS — Z794 Long term (current) use of insulin: Secondary | ICD-10-CM

## 2018-05-22 DIAGNOSIS — J302 Other seasonal allergic rhinitis: Secondary | ICD-10-CM

## 2018-05-22 MED ORDER — FLUTICASONE PROPIONATE 50 MCG/ACT NA SUSP: 2.0000 | Freq: Two times a day (BID) | NASAL | 1 refills | Status: DC

## 2018-05-22 MED ORDER — CITALOPRAM HYDROBROMIDE 40 MG PO TABS: 40.0000 mg | ORAL_TABLET | Freq: Every day | ORAL | 0 refills | Status: DC

## 2018-05-22 MED ORDER — INSULIN GLARGINE 100 UNIT/ML SOLOSTAR PEN: 18.0000 [IU] | PEN_INJECTOR | Freq: Every day | SUBCUTANEOUS | Status: DC

## 2018-05-22 MED ORDER — INSULIN LISPRO (1 UNIT DIAL) 100 UNIT/ML (KWIKPEN): 5.0000 [IU] | PEN_INJECTOR | Freq: Three times a day (TID) | SUBCUTANEOUS | Status: DC

## 2018-05-22 NOTE — Telephone Encounter (Signed)
Called patient and she reported her blood sugar readings are consistently in the 200s.  Denies any readings < 200.  She has had a few readings > 299   She reports drinkiing only water (denies soda), she has cut back on fruit and she has cut our her slim fast shake.     She reports using Lantus 15 once daily and Humalog 4 units 3-4 times per day.  She believes the increase in her blood sugar is due to the shot she had in her finger.   We agreed to increase her insulin further  Lantus was increased from 15 to 18 units ONCE dialy.  Humalog was increased form 4 to 5 units prior to meals 3 or four times per day.   She was advised to call us if her blood sugars were < 100 repeatedl.   I shared that either her PCP Dr. Burr Medico or I would call her to follow up next week.

## 2018-05-22 NOTE — Telephone Encounter (Signed)
-----   Message from Leavy Cella, [facility] sent at 05/19/2018 11:14 AM EDT ----- Regarding: Diabetes Follow Up CBGs > 200 and increased lantus from 12 to 15 units daily on 4/21

## 2018-05-25 DIAGNOSIS — M109 Gout, unspecified: Secondary | ICD-10-CM | POA: Diagnosis not present

## 2018-05-25 NOTE — Telephone Encounter (Signed)
Noted and agree. 

## 2018-05-26 ENCOUNTER — Telehealth: Payer: Self-pay | Admitting: Pharmacist

## 2018-05-26 MED ORDER — INSULIN LISPRO (1 UNIT DIAL) 100 UNIT/ML (KWIKPEN): 6.0000 [IU] | PEN_INJECTOR | Freq: Three times a day (TID) | SUBCUTANEOUS | Status: DC

## 2018-05-26 NOTE — Telephone Encounter (Signed)
-----   Message from Leavy Cella, [facility] sent at 05/22/2018  4:57 PM EDT ----- Regarding: DM follow-up Revaluate CBG -  dose reduction possible if steroid effect is wearing off

## 2018-05-26 NOTE — Telephone Encounter (Signed)
Patient reports blood sugar continues to be > 300  Routinely despite increasing insulin doses.    Reports ulcers in her mouth/ back of teeth in the back for the last month.  She states that she has not seen a dentist in > 1 year.   Reports dry mouth/ sore lips.  She feels like she is dehydrated due to high blood sugars.   Her weight today was 395 (decreased from > 400lbs).  She already has taken fluid pills furosemide and metolazone this morning.    Advised to NOT take fluid pills again today (skip second dose today).   Advised to increase mealtime insulin from 5 units with each meal to 6 units prior to each meal.   Plan to forward to PCP for potential follow-up in the next 24 hours (potential in clinic visit discussed and patient was interested in being seen).

## 2018-05-26 NOTE — Telephone Encounter (Signed)
Noted and agree. 

## 2018-05-28 ENCOUNTER — Telehealth: Payer: Self-pay | Admitting: Pharmacist

## 2018-05-28 NOTE — Telephone Encounter (Signed)
Patient calls and reports her blood sugars are slightly better but numbers are  272 this AM 267 last night 373 prior to dinner (ate 2 hot dogs for lunch) 293 yesterday morning  She also reports new chest congestion and thick phlegm.  She is willing to talk with or see her PCP today.   Advised I would share information with PCP directly this AM.

## 2018-05-28 NOTE — Telephone Encounter (Signed)
Spoke with patient and scheduled her for ATC visit tomorrow afternoon. If she has an infection in her gums this may be leading to elevated blood sugars. She additionally endorses vaginal complaints. Will forward to ATC provider as FYI.

## 2018-05-29 ENCOUNTER — Ambulatory Visit (INDEPENDENT_AMBULATORY_CARE_PROVIDER_SITE_OTHER): Payer: Medicare Other | Admitting: Family Medicine

## 2018-05-29 ENCOUNTER — Other Ambulatory Visit: Payer: Self-pay | Admitting: Student in an Organized Health Care Education/Training Program

## 2018-05-29 ENCOUNTER — Other Ambulatory Visit: Payer: Self-pay

## 2018-05-29 VITALS — BP 110/62 | HR 83

## 2018-05-29 DIAGNOSIS — N183 Chronic kidney disease, stage 3 unspecified: Secondary | ICD-10-CM

## 2018-05-29 DIAGNOSIS — Z794 Long term (current) use of insulin: Secondary | ICD-10-CM | POA: Diagnosis not present

## 2018-05-29 DIAGNOSIS — E1122 Type 2 diabetes mellitus with diabetic chronic kidney disease: Secondary | ICD-10-CM | POA: Diagnosis not present

## 2018-05-29 LAB — POCT GLYCOSYLATED HEMOGLOBIN (HGB A1C): HbA1c, POC (controlled diabetic range): 10.3 % — AB (ref 0.0–7.0)

## 2018-05-29 NOTE — Patient Instructions (Addendum)
Increase your lantus to 20U daily.   We will have a telemedicine visit on Tuesday to follow up on your sugars.  Switch away from sodas and sports drinks.

## 2018-05-29 NOTE — Progress Notes (Signed)
Subjective:  [name] is a 53 y.o. female who presents to the [facility] today with a chief complaint of mouth sores.   HPI:  Patient states that her sugars have been elevated ever since getting a steroid shot for trigger finger on the beginning of April.  She states her sugars have been in the high 200s to low 300s daily.  She is having polyuria and polydipsia.  She is trying to stay well-hydrated with lots of water but is also drinking lots of sugar-free sodas and sports drinks. She has been having difficulty with sore spots in her mouth.  There are 2 spots on either side of her lower gums that are very painful whenever her tongue rubs against it or if she touches it.  She has not noticed any open sores.  There is no been no drainage. She does not have any sputum production or cough.  But rather states that her saliva has been so sick from her mouth being dry that she has been trying to cough it up.  This is thick saliva sensation causes her to feel short of breath temporarily until she has managed to deal with her thick saliva.  She has no wheezing.  No fever.  ROS: Per HPI   CC, SH/smoking status, and VS noted  Objective:  Physical Exam: BP 110/62   Pulse 83   LMP 04/10/2006   SpO2 91%   Gen: NAD, resting comfortably HEENT: Patient has no sores in her gums, tongue, oropharynx.  She has had no mucosal breakdown.  Her remaining teeth look to be in good dentition.  She does wear upper dentures.  She has bilateral tender spots that are small and symmetric in nature on either side of her inner back gums. Lips dry.  Pulm: NWOB Neuro: grossly normal, moves all extremities Psych: Normal affect and thought content  Results for orders placed or performed in visit on 05/29/18 (from the past 72 hour(s))  HgB A1c     Status: Abnormal   Collection Time: 05/29/18  2:35 PM  Result Value Ref Range   Hemoglobin A1C     HbA1c POC (<> result, manual entry)     HbA1c, POC (prediabetic range)      HbA1c, POC (controlled diabetic range) 10.3 (A) 0.0 - 7.0 %     Assessment/Plan:  Type 2 diabetes mellitus with stage 3 chronic kidney disease, with long-term current use of insulin (HCC) There was some concern of potential mouth infection causing continued sugar elevation.  Based on a very throughout mouth exam today there is no signs of infection or ulcers. Most likely a mechanical issue given its exact symmetric location both sides of her inner back gums.  Uncertain etiology why patient sugars continue to be so elevated.  It is certainly causing her to be dehydrated.  It may be due to the steroid injection that she had at the beginning of the month but that would be unusual to have that she continue to have those effects at this time.  Also likely some dietary contribution given her continued soda and sports drink intake however patient is adamant that these are sugar-free beverages and that she follows a good diabetic diet.  Increase Lantus to 20 units daily continue Humalog at 6 units 3 times daily with meals telemedicine visit made for Tuesday to follow-up on home readings.    Orders Placed This Encounter  Procedures  . HgB A1c   Bufford Lope, DO PGY-3,  [place] 05/29/2018 2:37 PM

## 2018-05-29 NOTE — Assessment & Plan Note (Addendum)
There was some concern of potential mouth infection causing continued sugar elevation.  Based on a very throughout mouth exam today there is no signs of infection or ulcers. Most likely a mechanical issue given its exact symmetric location both sides of her inner back gums.  Uncertain etiology why patient sugars continue to be so elevated.  It is certainly causing her to be dehydrated.  It may be due to the steroid injection that she had at the beginning of the month but that would be unusual to have that she continue to have those effects at this time.  Also likely some dietary contribution given her continued soda and sports drink intake however patient is adamant that these are sugar-free beverages and that she follows a good diabetic diet.  Increase Lantus to 20 units daily continue Humalog at 6 units 3 times daily with meals telemedicine visit made for Tuesday to follow-up on home readings.

## 2018-06-02 ENCOUNTER — Telehealth (INDEPENDENT_AMBULATORY_CARE_PROVIDER_SITE_OTHER): Payer: Medicare Other | Admitting: Family Medicine

## 2018-06-02 ENCOUNTER — Other Ambulatory Visit: Payer: Self-pay

## 2018-06-02 DIAGNOSIS — N183 Chronic kidney disease, stage 3 unspecified: Secondary | ICD-10-CM

## 2018-06-02 DIAGNOSIS — Z794 Long term (current) use of insulin: Secondary | ICD-10-CM | POA: Diagnosis not present

## 2018-06-02 DIAGNOSIS — E1122 Type 2 diabetes mellitus with diabetic chronic kidney disease: Secondary | ICD-10-CM

## 2018-06-02 DIAGNOSIS — K1379 Other lesions of oral mucosa: Secondary | ICD-10-CM | POA: Insufficient documentation

## 2018-06-02 MED ORDER — INSULIN GLARGINE 100 UNIT/ML SOLOSTAR PEN: 22.0000 [IU] | PEN_INJECTOR | Freq: Every day | SUBCUTANEOUS | Status: DC

## 2018-06-02 NOTE — Assessment & Plan Note (Signed)
Increase Lantus to 22 units daily.  Continue Humalog 6 units 3 times daily with meals.  Encourage patient efforts in her healthy dietary changes.  Follow-up telemedicine visit made with PCP for 5/18.

## 2018-06-02 NOTE — Assessment & Plan Note (Signed)
Patient had 2 sore areas on the inner gums symmetrically on either side of her mouth.  This is not improved with the last denture wearing.  On thorough exam in person on 5/1 there are no open sores or other findings.  Advised patient to follow-up with her dentist.  She will need a note from her PCP stating that her dentist can work on her mouth because of her uncontrolled diabetes.  She does not know her dentist contact information will call back with this.

## 2018-06-02 NOTE — Progress Notes (Signed)
[place] Telemedicine Visit  Patient consented to have virtual visit. Method of visit: Video  Encounter participants: Patient: [name] - located at home Provider: Bufford Lope - located at [facility] Others (if applicable): none  Chief Complaint: diabetes follow up  HPI:  Patient states that she has been able to increase her Lantus to 20 units daily.  She is also starting to eat better with fish and Brussels sprouts.  She is drinking less soda  and sports drinks but her mouth is very dry. Morning fastings 208-255, highest postprandial 335. States slightly improved from previous.  She still doing Humalog 6 units 3 times daily with meals. She has been wearing her upper dentures the last and the sore areas in her mouth or not improved.  She has had no open sores or drainage.  No fever.    ROS: Per HPI   Pertinent PMHx: Diabetes  Exam:  General: Well-appearing in no acute distress Respiratory: Normal work of breathing, speaks in full sentences  Assessment/Plan:  Type 2 diabetes mellitus with stage 3 chronic kidney disease, with long-term current use of insulin (HCC) Increase Lantus to 22 units daily.  Continue Humalog 6 units 3 times daily with meals.  Encourage patient efforts in her healthy dietary changes.  Follow-up telemedicine visit made with PCP for 5/18.  Mouth pain Patient had 2 sore areas on the inner gums symmetrically on either side of her mouth.  This is not improved with the last denture wearing.  On thorough exam in person on 5/1 there are no open sores or other findings.  Advised patient to follow-up with her dentist.  She will need a note from her PCP stating that her dentist can work on her mouth because of her uncontrolled diabetes.  She does not know her dentist contact information will call back with this.    Bufford Lope, DO PGY-3, [facility] 06/02/2018 1:54 PM

## 2018-06-04 ENCOUNTER — Encounter: Payer: Self-pay | Admitting: Dietician

## 2018-06-04 ENCOUNTER — Encounter: Payer: Medicare Other | Attending: General Surgery | Admitting: Dietician

## 2018-06-04 ENCOUNTER — Other Ambulatory Visit: Payer: Self-pay

## 2018-06-04 VITALS — Wt 395.0 lb

## 2018-06-04 DIAGNOSIS — J449 Chronic obstructive pulmonary disease, unspecified: Secondary | ICD-10-CM | POA: Insufficient documentation

## 2018-06-04 DIAGNOSIS — E669 Obesity, unspecified: Secondary | ICD-10-CM | POA: Insufficient documentation

## 2018-06-04 DIAGNOSIS — Z713 Dietary counseling and surveillance: Secondary | ICD-10-CM | POA: Diagnosis not present

## 2018-06-04 DIAGNOSIS — K219 Gastro-esophageal reflux disease without esophagitis: Secondary | ICD-10-CM | POA: Diagnosis not present

## 2018-06-04 DIAGNOSIS — Z6841 Body Mass Index (BMI) 40.0 and over, adult: Secondary | ICD-10-CM | POA: Diagnosis not present

## 2018-06-04 DIAGNOSIS — E1122 Type 2 diabetes mellitus with diabetic chronic kidney disease: Secondary | ICD-10-CM | POA: Diagnosis not present

## 2018-06-04 DIAGNOSIS — E785 Hyperlipidemia, unspecified: Secondary | ICD-10-CM | POA: Insufficient documentation

## 2018-06-04 DIAGNOSIS — F418 Other specified anxiety disorders: Secondary | ICD-10-CM | POA: Insufficient documentation

## 2018-06-04 DIAGNOSIS — J9621 Acute and chronic respiratory failure with hypoxia: Secondary | ICD-10-CM | POA: Diagnosis not present

## 2018-06-04 DIAGNOSIS — I129 Hypertensive chronic kidney disease with stage 1 through stage 4 chronic kidney disease, or unspecified chronic kidney disease: Secondary | ICD-10-CM | POA: Diagnosis not present

## 2018-06-04 DIAGNOSIS — N184 Chronic kidney disease, stage 4 (severe): Secondary | ICD-10-CM | POA: Insufficient documentation

## 2018-06-04 DIAGNOSIS — G4733 Obstructive sleep apnea (adult) (pediatric): Secondary | ICD-10-CM | POA: Diagnosis not present

## 2018-06-04 DIAGNOSIS — M5090 Cervical disc disorder, unspecified, unspecified cervical region: Secondary | ICD-10-CM | POA: Diagnosis not present

## 2018-06-04 NOTE — Patient Instructions (Signed)
   Track fluid intake, using MyFitness Pal or writing it down, making a note of everything you drink.   Remember to stop drinking 15 minutes before you eat, do not drink while you are eating, and wait at least 30 minutes after you are finished eating.   Keep up the GREAT work! You are making good food and beverage choices, and getting in a lot more walking is doing great for you. See you next month!

## 2018-06-04 NOTE — Progress Notes (Signed)
Bariatric Supervised Weight Loss Visit Appt Start Time: 7:30am  End Time: 7:45am  Planned Surgery: RYGB   4th out of 5 SWL Appointments   Pt may require wheelchair for transportation to and from her vehicle.   NUTRITION ASSESSMENT  Anthropometrics  Start weight at [facility]: 405.6 lbs (01/15/2018) Today's weight: 395 lbs (pt reported from home) Weight change: -10 lbs since previous visit on 05/05/2018 BMI: 56.68 kg/m2    Checks blood sugar 3 times/day, was in the 300s, 400s, and 500s until the past couple days. Insulin and meds have increased units. Pt reports yesterday's readings were in the 100s.  Clinical  Medications: see list  Supplements: MVI (new: added omega-3)  Psychosocial/Lifestyle Pt lives alone, and her son performs the food shopping for her. Pt's ability to walk/move is restricted dt health conditions. Pt is talkative. Currently pt is off oxygen, and not using wheel chair.   24-Hr Dietary Recall First Meal: sugar-free jello  Snack: sugar-free ice cream    Second Meal: fish + brussels sprouts  Snack: orange (or apple)  Third Meal: fish + spinach salad  Snack: sugar-free candy Beverages: water  Food & Nutrition Related Hx Dietary Hx: Pt states she usually fixes a lot of food at one time then eats it for several meals until it is gone. Pt states she has reduced the amount of sugar she consumes. Pt states she has not had tea in a long time. Pt states she is going to use organic sugar to sweeten things. Pt states she avoids dairy products, butter, olive oil, and heavy greasy foods to avoid heartburn. Pt states she plans to try skinny pop popcorn instead of regular popcorn. Pt states she went to the grocery store and used the electric scooter to get around. Pt states she likes baked chicken, chicken wings, and smoked [place]. Likes bananas, oranges, and apples.  Eats a lot of [place], fish, brussels sprouts, sugar-free popsicles and jello. Got a smaller bowl to help with portion  size. Will eat the whole bag of brussels sprouts at a time and cooks them with water, vinegar, and butter.  Estimated Daily Fluid Intake: 4 L GI / Other Notable Symptoms: none   Physical Activity  Current average weekly physical activity: walking 15-30 minutes daily. Looking forward to swimming when the pool opens back up.    Estimated Energy Needs Calories: 1600 Carbohydrate: 180g Protein: 120g Fat: 44g  Progress Towards Pre-Op Goals Previously Chosen . Pt has downloaded MyFitness Pal.  . Making better food choices, focusing on keeping grams of fat and sugar 9 or less per serving with the exception of kool-aid.   . Pt has found she likes Art gallery manager Fast shakes.  . Does not drink with meals.  . Taking MVI and omega-3 supplements.  . Cut down on sugar-sweetened beverages.  . Physical activity has increased, doing a lot more walking and less using the wheelchair.    NUTRITION DIAGNOSIS  Overweight/obesity ([place]-3.3) related to past poor dietary habits and physical inactivity as evidenced by patient w/ planned RYGB surgery following dietary guidelines for continued weight loss.    NUTRITION INTERVENTION  Nutrition counseling (C-1) and education (E-2) to facilitate bariatric surgery goals.  New Pre-Op Goals to Work On . Track food and beverage intake  . Avoid drinking with meals  Learning Style & Readiness for Change Teaching method utilized: Visual & Auditory  Demonstrated degree of understanding via: Teach Back  Barriers to learning/adherence to lifestyle change: multiple health conditions  limit ability to be active, contemplative stage of change   MONITORING & EVALUATION Dietary intake, weekly physical activity, body weight, and pre-op goals in 1 month.   Next Steps  Notes for next SWL Visit:   Assess progress made on Pre-Op Goals discussed thus far.   Patient is to return to [facility] in 1 month for 5th SWL Visit.

## 2018-06-05 DIAGNOSIS — G894 Chronic pain syndrome: Secondary | ICD-10-CM | POA: Diagnosis not present

## 2018-06-05 DIAGNOSIS — M5136 Other intervertebral disc degeneration, lumbar region: Secondary | ICD-10-CM | POA: Diagnosis not present

## 2018-06-05 DIAGNOSIS — Z79899 Other long term (current) drug therapy: Secondary | ICD-10-CM | POA: Diagnosis not present

## 2018-06-05 DIAGNOSIS — M5030 Other cervical disc degeneration, unspecified cervical region: Secondary | ICD-10-CM | POA: Diagnosis not present

## 2018-06-06 ENCOUNTER — Other Ambulatory Visit: Payer: Self-pay | Admitting: Student in an Organized Health Care Education/Training Program

## 2018-06-06 DIAGNOSIS — J9621 Acute and chronic respiratory failure with hypoxia: Secondary | ICD-10-CM | POA: Diagnosis not present

## 2018-06-06 DIAGNOSIS — M5090 Cervical disc disorder, unspecified, unspecified cervical region: Secondary | ICD-10-CM | POA: Diagnosis not present

## 2018-06-06 DIAGNOSIS — G4733 Obstructive sleep apnea (adult) (pediatric): Secondary | ICD-10-CM | POA: Diagnosis not present

## 2018-06-09 ENCOUNTER — Ambulatory Visit (INDEPENDENT_AMBULATORY_CARE_PROVIDER_SITE_OTHER): Payer: Medicare Other | Admitting: Podiatry

## 2018-06-09 ENCOUNTER — Other Ambulatory Visit: Payer: Self-pay

## 2018-06-09 VITALS — Temp 97.5°F

## 2018-06-09 DIAGNOSIS — M79674 Pain in right toe(s): Secondary | ICD-10-CM | POA: Diagnosis not present

## 2018-06-09 DIAGNOSIS — M79675 Pain in left toe(s): Secondary | ICD-10-CM | POA: Diagnosis not present

## 2018-06-09 DIAGNOSIS — E1142 Type 2 diabetes mellitus with diabetic polyneuropathy: Secondary | ICD-10-CM | POA: Diagnosis not present

## 2018-06-09 DIAGNOSIS — B351 Tinea unguium: Secondary | ICD-10-CM | POA: Diagnosis not present

## 2018-06-09 NOTE — Progress Notes (Signed)
Complaint:  Visit Type: Patient returns to my office for continued preventative foot care services. Complaint: Patient states" my nails have grown long and thick and become painful to walk and wear shoes" Patient has been diagnosed with DM with angiopathy. The patient presents for preventative foot care services. No changes to ROS.   Podiatric Exam: Vascular: dorsalis pedis and posterior tibial pulses are palpable bilateral. Capillary return is immediate. Temperature gradient is WNL. Skin turgor WNL  Sensorium: Normal Semmes Weinstein monofilament test. Normal tactile sensation bilaterally. Nail Exam: Pt has thick disfigured discolored nails with subungual debris noted bilateral entire nail hallux through fifth toenails Ulcer Exam: There is no evidence of ulcer or pre-ulcerative changes or infection. Orthopedic Exam: Muscle tone and strength are WNL. No limitations in general ROM. No crepitus or effusions noted. Foot type and digits show no abnormalities. Bony prominences are unremarkable. Skin: No Porokeratosis. No infection or ulcers.  Thick hyperkeratotic skin right foot.  Diagnosis:  Onychomycosis, , Pain in right toe, pain in left toes  Treatment & Plan Procedures and Treatment: Consent by patient was obtained for treatment procedures.   Debridement of mycotic and hypertrophic toenails, 1 through 5 bilateral and clearing of subungual debris. No ulceration, no infection noted.  Return Visit-Office Procedure: Patient instructed to return to the office for a follow up visit 10 weeks  for continued evaluation and treatment.    Gardiner Barefoot DPM

## 2018-06-14 ENCOUNTER — Other Ambulatory Visit: Payer: Self-pay | Admitting: Family Medicine

## 2018-06-14 DIAGNOSIS — J302 Other seasonal allergic rhinitis: Secondary | ICD-10-CM

## 2018-06-15 ENCOUNTER — Other Ambulatory Visit: Payer: Self-pay

## 2018-06-15 ENCOUNTER — Encounter: Payer: Self-pay | Admitting: Student in an Organized Health Care Education/Training Program

## 2018-06-15 ENCOUNTER — Telehealth (INDEPENDENT_AMBULATORY_CARE_PROVIDER_SITE_OTHER): Payer: Medicare Other | Admitting: Student in an Organized Health Care Education/Training Program

## 2018-06-15 DIAGNOSIS — Z794 Long term (current) use of insulin: Secondary | ICD-10-CM

## 2018-06-15 DIAGNOSIS — N183 Chronic kidney disease, stage 3 unspecified: Secondary | ICD-10-CM

## 2018-06-15 DIAGNOSIS — E1122 Type 2 diabetes mellitus with diabetic chronic kidney disease: Secondary | ICD-10-CM

## 2018-06-15 NOTE — Assessment & Plan Note (Signed)
Patient with previous hyperglycemia which she attributes to trigger finger corticosteroid injection. CBG's now improved. There is room to improve her CBG further however with shared decision making patient elects to continue current diabetic regimen CBG's <200 per patient report Continue Lantus 20u qAM Continue TID 6u Humalog Follow up in early August for next A1c

## 2018-06-15 NOTE — Progress Notes (Signed)
Virtual Visit via Video Note  I connected with [name] on 51/76/16 at  8:40 AM EDT by a video enabled telemedicine application and verified that I am speaking with the correct person using two identifiers.  Location: Patient: [name] Provider: [name]   I discussed the limitations of evaluation and management by telemedicine and the availability of in person appointments. The patient expressed understanding and agreed to proceed.  History of Present Illness:  Patient presents today for virtual visit follow-up of elevated blood sugars.  Her last visit was with Dr. Shawna Orleans.  She was advised to increase her Lantus to 22 units daily and continue Humalog 6 units 3 times daily with meals.    Today, the patient reports that she feels her blood sugars were elevated in the setting of a steroid trigger finger injection.  She feels that it took an extended period of time to regain control of her sugars.  She did not increase her Lantus to 22 units daily however continued at the 20 units daily.  She continues to take her Humalog 6 units 3 times daily.  She reports improvement in blood sugars.  She was 191 this morning, 202 yesterday.  She is not spiking throughout the day per her report.  She is focusing on diet and lifestyle changes including eating plenty of Brussels sprouts, spinach, seafood and [place].  She also had soreness in her mouth which she attributes to elevated blood sugars.  This is improving and resolving.  She does not feel that the soreness in her mouth was related to dentures because her dentures could not reach the area that is sore.  Observations/Objective: Patient appears well over the video.  She speaks in full sentences.  She does not have any labored breathing.  Assessment and Plan: 1. Type 2 diabetes mellitus with stage 3 chronic kidney disease, with long-term current use of insulin ([place]) Patient with previous hyperglycemia which she attributes to trigger finger  corticosteroid injection. CBG's now improved. There is room to improve her CBG further however with shared decision making patient elects to continue current diabetic regimen CBG's <200 per patient report Continue Lantus 20u qAM Continue TID 6u Humalog Follow up in early August for next A1c  Follow Up Instructions:    I discussed the assessment and treatment plan with the patient. The patient was provided an opportunity to ask questions and all were answered. The patient agreed with the plan and demonstrated an understanding of the instructions.   The patient was advised to call back or seek an in-person evaluation if the symptoms worsen or if the condition fails to improve as anticipated.  I provided 15 minutes of non-face-to-face time during this encounter.   [name], MD

## 2018-06-16 ENCOUNTER — Other Ambulatory Visit: Payer: Self-pay

## 2018-06-16 MED ORDER — METOLAZONE 2.5 MG PO TABS: 2.5000 mg | ORAL_TABLET | ORAL | 6 refills | Status: DC

## 2018-06-23 ENCOUNTER — Other Ambulatory Visit: Payer: Self-pay | Admitting: Student in an Organized Health Care Education/Training Program

## 2018-07-05 DIAGNOSIS — M5090 Cervical disc disorder, unspecified, unspecified cervical region: Secondary | ICD-10-CM | POA: Diagnosis not present

## 2018-07-05 DIAGNOSIS — J9621 Acute and chronic respiratory failure with hypoxia: Secondary | ICD-10-CM | POA: Diagnosis not present

## 2018-07-05 DIAGNOSIS — G4733 Obstructive sleep apnea (adult) (pediatric): Secondary | ICD-10-CM | POA: Diagnosis not present

## 2018-07-06 DIAGNOSIS — G894 Chronic pain syndrome: Secondary | ICD-10-CM | POA: Diagnosis not present

## 2018-07-06 DIAGNOSIS — Z79899 Other long term (current) drug therapy: Secondary | ICD-10-CM | POA: Diagnosis not present

## 2018-07-06 DIAGNOSIS — M48061 Spinal stenosis, lumbar region without neurogenic claudication: Secondary | ICD-10-CM | POA: Diagnosis not present

## 2018-07-06 DIAGNOSIS — G8929 Other chronic pain: Secondary | ICD-10-CM | POA: Diagnosis not present

## 2018-07-07 ENCOUNTER — Other Ambulatory Visit: Payer: Self-pay

## 2018-07-07 DIAGNOSIS — M5090 Cervical disc disorder, unspecified, unspecified cervical region: Secondary | ICD-10-CM | POA: Diagnosis not present

## 2018-07-07 DIAGNOSIS — G4733 Obstructive sleep apnea (adult) (pediatric): Secondary | ICD-10-CM | POA: Diagnosis not present

## 2018-07-07 DIAGNOSIS — J9621 Acute and chronic respiratory failure with hypoxia: Secondary | ICD-10-CM | POA: Diagnosis not present

## 2018-07-08 ENCOUNTER — Encounter: Payer: Medicare Other | Attending: General Surgery | Admitting: Dietician

## 2018-07-08 ENCOUNTER — Ambulatory Visit: Payer: Medicare Other | Admitting: Student in an Organized Health Care Education/Training Program

## 2018-07-08 ENCOUNTER — Telehealth (INDEPENDENT_AMBULATORY_CARE_PROVIDER_SITE_OTHER): Payer: Medicare Other | Admitting: Student in an Organized Health Care Education/Training Program

## 2018-07-08 ENCOUNTER — Other Ambulatory Visit: Payer: Self-pay

## 2018-07-08 ENCOUNTER — Encounter: Payer: Self-pay | Admitting: Dietician

## 2018-07-08 VITALS — Wt 395.6 lb

## 2018-07-08 DIAGNOSIS — M5442 Lumbago with sciatica, left side: Secondary | ICD-10-CM

## 2018-07-08 DIAGNOSIS — Z6841 Body Mass Index (BMI) 40.0 and over, adult: Secondary | ICD-10-CM | POA: Insufficient documentation

## 2018-07-08 DIAGNOSIS — I129 Hypertensive chronic kidney disease with stage 1 through stage 4 chronic kidney disease, or unspecified chronic kidney disease: Secondary | ICD-10-CM | POA: Insufficient documentation

## 2018-07-08 DIAGNOSIS — F418 Other specified anxiety disorders: Secondary | ICD-10-CM | POA: Insufficient documentation

## 2018-07-08 DIAGNOSIS — E669 Obesity, unspecified: Secondary | ICD-10-CM

## 2018-07-08 DIAGNOSIS — N184 Chronic kidney disease, stage 4 (severe): Secondary | ICD-10-CM | POA: Insufficient documentation

## 2018-07-08 DIAGNOSIS — K219 Gastro-esophageal reflux disease without esophagitis: Secondary | ICD-10-CM | POA: Insufficient documentation

## 2018-07-08 DIAGNOSIS — E1122 Type 2 diabetes mellitus with diabetic chronic kidney disease: Secondary | ICD-10-CM | POA: Insufficient documentation

## 2018-07-08 DIAGNOSIS — E785 Hyperlipidemia, unspecified: Secondary | ICD-10-CM | POA: Insufficient documentation

## 2018-07-08 DIAGNOSIS — Z713 Dietary counseling and surveillance: Secondary | ICD-10-CM | POA: Insufficient documentation

## 2018-07-08 DIAGNOSIS — J449 Chronic obstructive pulmonary disease, unspecified: Secondary | ICD-10-CM | POA: Insufficient documentation

## 2018-07-08 MED ORDER — INSULIN LISPRO (1 UNIT DIAL) 100 UNIT/ML (KWIKPEN): 6.0000 [IU] | PEN_INJECTOR | Freq: Three times a day (TID) | SUBCUTANEOUS | Status: DC

## 2018-07-08 NOTE — Patient Instructions (Signed)
Instructions/ handouts will be mailed to patient.

## 2018-07-08 NOTE — Progress Notes (Signed)
   [place] Telemedicine Visit  Patient consented to have virtual visit. Method of visit: Video  Encounter participants: Patient: [name] - located at home Provider: [name] - located at [facility] Others (if applicable): None  Chief Complaint: Back pain  HPI: Patient presents for video visit.  She reports that her back "gave out" on Sunday at 3 AM.  She was walking and then felt electric pain on the right side of her back.  Now the pain is moved to her left side.  She has pain of the buttocks and up to the middle of the back.  She reports that pain is not going down to her knee started on Monday night.  She reports that the right knee buckled.  She has pain with standing, sitting, and walking.  She continues to take her chronic pain medications with hydrocodone 7.5-325 and a muscle relaxer 3 times daily.  She additionally has gabapentin.  Patient has not had any urinary or fecal incontinence.  She has not had any fevers.  She is not injecting drugs into her back.  She denies a history of cancer.  She is not any weakness or falls.  No numbness or tingling.  No history of trauma.  No history of steroid use.  ROS see HPI Smoking Status noted -patient previously quit smoking but restarted recently.   ROS: per HPI  Pertinent PMHx: Obesity, diabetes, CKD 4  Exam:  Respiratory: Speaks in full sentences  Assessment/Plan:  Back pain Acute on chronic.  Patient has a pain management physician.  No red flag symptoms.  Advised patient to schedule an office visit so that she can be evaluated in person.  Red flags and reasons to call back or go to the emergency department were discussed.    Time spent during visit with patient: 15 minutes  [name], MD,MS,  PGY3 07/10/2018 9:14 PM

## 2018-07-08 NOTE — Progress Notes (Signed)
Bariatric Supervised Weight Loss Visit Appt Start Time: 7:30am  End Time: 7:45am  Planned Surgery: RYGB   5th out of 5 SWL Appointments   Telephone Visit This visit was completed via telephone due to the COVID-19 pandemic.   I spoke with [name] and verified that I was speaking with the correct person with two patient identifiers (full name and date of birth).   I discussed the limitations related to this kind of visit and the patient is willing to proceed.   NUTRITION ASSESSMENT  Anthropometrics  Start weight at [facility]: 405.6 lbs (01/15/2018) Today's weight: 395.6 lbs (pt reported from home) Weight change: +0.6 lbs since previous visit on 05/05/2018 BMI: 56.76 kg/m2    Checks blood sugar 3 times/day, reports her readings are in the 100s.  Clinical  Medications: see list  Supplements: MVI   Psychosocial/Lifestyle Pt lives alone, and her son performs the food shopping for her. Pt's ability to walk/move is restricted dt health conditions. Pt is talkative. Currently pt is off oxygen, and not using wheel chair.   24-Hr Dietary Recall First Meal: smoothie (bought) Snack: banana   Second Meal: catfish nuggets +  + brussels sprouts  Snack: yogurt Third Meal: fish + spinach salad  Snack: sugar-free popsicles  Beverages: water, beverages (such as Kool-aid) sweetened with stevia   Food & Nutrition Related Hx Dietary Hx: Pt states she usually fixes a lot of food at one time then eats it for several meals until it is gone. Pt states she is going to use organic sugar or stevia to sweeten things. Pt states she avoids dairy products, butter, olive oil, and heavy greasy foods to avoid heartburn. Pt states she plans to try skinny pop popcorn instead of regular popcorn. Pt states she likes baked chicken, chicken wings, and smoked [place]. Likes bananas, oranges, and apples.  Eats a lot of [place], fish, brussels sprouts, sugar-free popsicles and jello. Got a smaller bowl to help with  portion size. Will eat the whole bag of brussels sprouts at a time and cooks them with water, vinegar, and butter.   Estimated Daily Fluid Intake: 64+ oz GI / Other Notable Symptoms: none   Physical Activity  Current average weekly physical activity: Previously walking 15-30 minutes daily. Reports she threw her back out 4 days ago and has not been able to move much since.     Estimated Energy Needs Calories: 1600 Carbohydrate: 180g Protein: 120g Fat: 44g  Progress Towards Pre-Op Goals Previously Chosen . Pt has downloaded MyFitness Pal.  . Making better food choices, focusing on keeping grams of fat and sugar 9 or less per serving with the exception of kool-aid.   . Pt has found she likes Art gallery manager Fast shakes.  . Does not drink with meals.  . Taking MVI and omega-3 supplements.  . Cut down on sugar-sweetened beverages.  . Physical activity has increased, doing a lot more walking and less using the wheelchair.    NUTRITION DIAGNOSIS  Overweight/obesity ([place]-3.3) related to past poor dietary habits and physical inactivity as evidenced by patient w/ planned RYGB surgery following dietary guidelines for continued weight loss.    NUTRITION INTERVENTION  Nutrition counseling (C-1) and education (E-2) to facilitate bariatric surgery goals.  Learning Style & Readiness for Change Teaching method utilized: Visual & Auditory  Demonstrated degree of understanding via: Teach Back  Barriers to learning/adherence to lifestyle change: multiple health conditions limit ability to be active, contemplative stage of change  MONITORING & EVALUATION Dietary intake, weekly physical activity, body weight, and pre-op goals at Pre-Op Class.   During the SWL Visits, we have worked on nutrition education and trying to establish good balanced eating habits. Pt has a lower literacy and sometimes has trouble understanding concepts but she is very determined and will stick to protocol. Being  very specific helps her understand and she does well with guidance.  Next Steps  Patient is to contact [facility] to schedule Pre-Op and Post-Op Classes once surgery date is scheduled.

## 2018-07-09 DIAGNOSIS — R768 Other specified abnormal immunological findings in serum: Secondary | ICD-10-CM | POA: Diagnosis not present

## 2018-07-09 DIAGNOSIS — E1122 Type 2 diabetes mellitus with diabetic chronic kidney disease: Secondary | ICD-10-CM | POA: Diagnosis not present

## 2018-07-09 DIAGNOSIS — N183 Chronic kidney disease, stage 3 (moderate): Secondary | ICD-10-CM | POA: Diagnosis not present

## 2018-07-09 DIAGNOSIS — M109 Gout, unspecified: Secondary | ICD-10-CM | POA: Diagnosis not present

## 2018-07-09 DIAGNOSIS — Z79899 Other long term (current) drug therapy: Secondary | ICD-10-CM | POA: Diagnosis not present

## 2018-07-10 ENCOUNTER — Telehealth (INDEPENDENT_AMBULATORY_CARE_PROVIDER_SITE_OTHER): Payer: Medicare Other | Admitting: Student in an Organized Health Care Education/Training Program

## 2018-07-10 ENCOUNTER — Encounter: Payer: Self-pay | Admitting: Student in an Organized Health Care Education/Training Program

## 2018-07-10 ENCOUNTER — Other Ambulatory Visit: Payer: Self-pay

## 2018-07-10 VITALS — BP 130/72 | HR 82 | Temp 98.9°F | Resp 22

## 2018-07-10 DIAGNOSIS — F320 Major depressive disorder, single episode, mild: Secondary | ICD-10-CM

## 2018-07-10 DIAGNOSIS — M549 Dorsalgia, unspecified: Secondary | ICD-10-CM

## 2018-07-10 DIAGNOSIS — E1122 Type 2 diabetes mellitus with diabetic chronic kidney disease: Secondary | ICD-10-CM

## 2018-07-10 DIAGNOSIS — R4589 Other symptoms and signs involving emotional state: Secondary | ICD-10-CM

## 2018-07-10 DIAGNOSIS — Z794 Long term (current) use of insulin: Secondary | ICD-10-CM | POA: Diagnosis not present

## 2018-07-10 DIAGNOSIS — N183 Chronic kidney disease, stage 3 unspecified: Secondary | ICD-10-CM

## 2018-07-10 LAB — POCT GLYCOSYLATED HEMOGLOBIN (HGB A1C): HbA1c, POC (controlled diabetic range): 9.4 % — AB (ref 0.0–7.0)

## 2018-07-10 MED ORDER — SERTRALINE HCL 25 MG PO TABS: 25.0000 mg | ORAL_TABLET | Freq: Every day | ORAL | 0 refills | Status: DC

## 2018-07-10 MED ORDER — KETOROLAC TROMETHAMINE 60 MG/2ML IM SOLN
60.0000 mg | Freq: Once | INTRAMUSCULAR | Status: AC
  Administered 2018-07-10: 60 mg via INTRAMUSCULAR

## 2018-07-10 MED ORDER — DICLOFENAC SODIUM 1 % TD GEL: TRANSDERMAL | 2 refills | Status: DC

## 2018-07-10 MED ORDER — HYDROXYZINE HCL 10 MG PO TABS: 10.0000 mg | ORAL_TABLET | Freq: Every day | ORAL | 0 refills | Status: DC | PRN

## 2018-07-10 MED ORDER — CHANTIX STARTING MONTH PAK 0.5 MG X 11 & 1 MG X 42 PO TABS: ORAL_TABLET | ORAL | 0 refills | Status: DC

## 2018-07-10 NOTE — Patient Instructions (Signed)
It was a pleasure seeing you today in our clinic.   Our clinic's number is [phone]. Please call with questions or concerns about what we discussed today.  Be well, Dr. Burr Medico

## 2018-07-10 NOTE — Progress Notes (Signed)
CC: back pain  HPI: [name] is a 53 y.o. female with PMH significant for HTN, depression and anxiety, severe obesity, CKD IV, T2DM, and many other comorbid conditions who presents to [facility] today with back pain of 1 weeks duration.   Back pain Patient presents with 1 week of back pain. The patient was bending down to sit on the toilet when she developed acute back pain with "electric shock" sensation down her right leg. She has had lower back pain with occasional radiation down the leg since that time. Pain is worse with sitting upright and better by bending forward. Pain is worse with walking around.   Patient has chronic back pathology with hx of spondylosis with disc narrowing and bulging on T11-12, hx disc narrowing T12-L2, hx of decompressive laminectomy in multiple lumbar levels.  Tobacco cessation Patient would like to quit smoking. Chantax has worked for her in the past. She is very motivated. She would like to try chantax again.  Depressed mood: No SI or HI. She has had depression in the past which responded to zoloft. She has been told she has bipolar disorder in the past but reports she never has had manic symptoms or manic episodes. She reports that her depression started when she had a trigger finger injection and resulting poorly controlled blood sugars. She has had poor appetite and poor energy levels.  ROS see HPI Smoking Status noted.  Review of Symptoms:  See HPI for ROS.   CC, SH/smoking status, and VS noted.  Objective: BP 130/72 (BP Location: Right Arm, Patient Position: Sitting, Cuff Size: Large)   Pulse 82   Temp 98.9 F (37.2 C) (Oral)   Resp (!) 22   LMP 04/10/2006   SpO2 92%  GEN: NAD, alert, cooperative, and pleasant. RESPIRATORY: Comfortable work of breathing, speaks in full sentences CV: Regular rate noted, distal extremities well perfused and warm without edema GI: Soft, nondistended SKIN: warm and dry, no rashes or lesions NEURO: II-XII  grossly intact MSK: Moves 4 extremities equally PSYCH: AAOx3, appropriate affect Back Exam: Patient unable to tolerating lying down, exam completed in sitting position. Inspection: Unremarkable  Palpable tenderness: None. Range of Motion: limited due to body habitus  Leg strength: 5/5 Strength at foot: 5/5 Sensory change: Gross sensation intact to all lumbar and sacral dermatomes.  Reflexes: 2+ at both patellar tendons, 2+ achilles tendons Gait unremarkable. Straight leg test: positive on the right  CT lumbar spine from 06/2017 IMPRESSION: LUMBAR MYELOGRAM IMPRESSION:  1. No abnormal motion with flexion or extension. 2. L1-2 spinal stenosis with worsening impingement during extension.  CT LUMBAR MYELOGRAM IMPRESSION:  1. L1-2 adjacent segment disease with moderate to advanced spinal stenosis. 2. T11-12and T12-L1 degenerative spinal stenosis with mild ventral cord flattening at T11-12. No progression since 2018 3. L2-L5 solid arthrodesis with patent canal and foramina. Incomplete segmentation at L5-S1  Assessment and plan:  Back pain Likely an inflamed disc. Patient has known degenerative disease in the back. She does have sciatic symptoms and positive straight leg test. No red flag symptoms such as weakness or loss of bowel/bladder function. - check XR to rule out stress fracture - voltaren gel -toradol injection - avoid NSAIDs chronically due to CKD4 - cannot do a steroid burst due to patient's labile diabetes when she gets steroids - strict reasons to call or return to care discussed   Depressed mood Patient has tolerated zoloft in the past. Will start at a low dose given hx of  bipolar disorder. Titrate up gradually as needed.   Orders Placed This Encounter  Procedures  . DG Lumbar Spine Complete    Standing Status:   Future    Standing Expiration Date:   09/09/2019    Order Specific Question:   Reason for Exam (SYMPTOM  OR DIAGNOSIS REQUIRED)    Answer:   acute  back pain, rule out fracture    Order Specific Question:   Is patient pregnant?    Answer:   No    Order Specific Question:   Preferred imaging location?    Answer:   [facility]    Order Specific Question:   Radiology Contrast Protocol - do NOT remove file path    Answer:   \\[email or web address].pdf  . HgB A1c    Meds ordered this encounter  Medications  . diclofenac sodium (VOLTAREN) 1 % GEL    Sig: APPLY 2 GRAMS TO AFFECTED AREA 4 TIMES A DAY    Dispense:  300 g    Refill:  2  . varenicline (CHANTIX STARTING MONTH PAK) 0.5 MG X 11 & 1 MG X 42 tablet    Sig: Take one 0.5 mg tablet by mouth once daily for 3 days, then increase to one 0.5 mg tablet twice daily for 4 days, then increase to one 1 mg tablet twice daily.    Dispense:  53 tablet    Refill:  0  . hydrOXYzine (ATARAX/VISTARIL) 10 MG tablet    Sig: Take 1 tablet (10 mg total) by mouth daily as needed.    Dispense:  30 tablet    Refill:  0  . sertraline (ZOLOFT) 25 MG tablet    Sig: Take 1 tablet (25 mg total) by mouth daily.    Dispense:  30 tablet    Refill:  0  . ketorolac (TORADOL) injection 60 mg     [name], MD,MS,  PGY3 07/13/2018 5:07 PM

## 2018-07-10 NOTE — Assessment & Plan Note (Signed)
Acute on chronic.  Patient has a pain management physician.  No red flag symptoms.  Advised patient to schedule an office visit so that she can be evaluated in person.  Red flags and reasons to call back or go to the emergency department were discussed.

## 2018-07-13 DIAGNOSIS — R4589 Other symptoms and signs involving emotional state: Secondary | ICD-10-CM | POA: Insufficient documentation

## 2018-07-13 NOTE — Assessment & Plan Note (Signed)
Patient has tolerated zoloft in the past. Will start at a low dose given hx of bipolar disorder. Titrate up gradually as needed.

## 2018-07-13 NOTE — Assessment & Plan Note (Signed)
Likely an inflamed disc. Patient has known degenerative disease in the back. She does have sciatic symptoms and positive straight leg test. No red flag symptoms such as weakness or loss of bowel/bladder function. - check XR to rule out stress fracture - voltaren gel -toradol injection - avoid NSAIDs chronically due to CKD4 - cannot do a steroid burst due to patient's labile diabetes when she gets steroids - strict reasons to call or return to care discussed

## 2018-07-15 ENCOUNTER — Encounter: Payer: Self-pay | Admitting: Student in an Organized Health Care Education/Training Program

## 2018-07-16 ENCOUNTER — Ambulatory Visit
Admission: RE | Admit: 2018-07-16 | Discharge: 2018-07-16 | Disposition: A | Discharge status: Home / Self Care | Payer: Medicare Other | Source: Ambulatory Visit | Attending: Family Medicine | Admitting: Family Medicine

## 2018-07-16 DIAGNOSIS — M549 Dorsalgia, unspecified: Secondary | ICD-10-CM

## 2018-07-16 DIAGNOSIS — M4726 Other spondylosis with radiculopathy, lumbar region: Secondary | ICD-10-CM | POA: Diagnosis not present

## 2018-07-16 NOTE — Telephone Encounter (Signed)
Called and spoke with patient. She is concerned that she has kideny pain. She is having right flank pain. No fevers. No changes in urination. She is taking tylenol and opioid pain medicine per pain clinic. She is not taking NSAIDs. She would like blood work because she feels her flank pain is different from her normal back pain and she is worried about her kidneys.  I put her on my schedule for tomorrow morning. Gave strict call-back precautions for worsening symptoms or fever.  [name], MD

## 2018-07-17 ENCOUNTER — Ambulatory Visit (INDEPENDENT_AMBULATORY_CARE_PROVIDER_SITE_OTHER): Payer: Medicare Other | Admitting: Student in an Organized Health Care Education/Training Program

## 2018-07-17 ENCOUNTER — Other Ambulatory Visit: Payer: Self-pay

## 2018-07-17 ENCOUNTER — Telehealth: Payer: Self-pay | Admitting: Cardiology

## 2018-07-17 VITALS — BP 128/70 | HR 49

## 2018-07-17 DIAGNOSIS — R1031 Right lower quadrant pain: Secondary | ICD-10-CM | POA: Diagnosis not present

## 2018-07-17 DIAGNOSIS — M5440 Lumbago with sciatica, unspecified side: Secondary | ICD-10-CM | POA: Diagnosis not present

## 2018-07-17 LAB — POCT URINALYSIS DIP (MANUAL ENTRY)
Bilirubin, UA: NEGATIVE
Blood, UA: NEGATIVE
Glucose, UA: 500 mg/dL — AB
Ketones, POC UA: NEGATIVE mg/dL
Nitrite, UA: NEGATIVE
Protein Ur, POC: NEGATIVE mg/dL
Spec Grav, UA: 1.015 (ref 1.010–1.025)
Urobilinogen, UA: 0.2 E.U./dL
pH, UA: 7 (ref 5.0–8.0)

## 2018-07-17 NOTE — Telephone Encounter (Signed)
smartphone/ consent/ my chart/ pre reg completed °

## 2018-07-17 NOTE — Progress Notes (Signed)
   CC: back pain and groin pain  HPI: [name] is a 53 y.o. female with PMH significant for HTN, depression and anxiety, severe obesity, CKD IV, T2DM, and many other comorbid conditions.  Pain in right groin Pain started Monday. Pain is described as intermittent. Pain does not radiate. She has been drinking plenty of fluid. She denies fevers. She denies dysuria, urinary frequency or urgency. She has chronic back pain as noted below, no new flank pain. No nausea or vomiting.   Back pain Patient was seen in the office on 6/12 (incorrectly posted in epic as a video visit). She was diagnosed with acute discitis. Plain films unchanged from previous at that time. She was given a Toradol injection which she states today did not help. She does take chronic opioids through pain management. Today she requests an MRI. Her back pain has not resolved - though it has only been 7 days since her last visit. She continues to have back pain with sciatica. No red flag symptoms: No fevers, paresthesias, falls or gait changes, no weakness, loss of urine or feces. She has an appt with pain medicine on July 1.   Review of Symptoms:  See HPI for ROS.   CC, SH/smoking status, and VS noted.  Objective: BP 128/70   Pulse (!) 49   LMP 04/10/2006   SpO2 92%  GEN: NAD, alert, cooperative, and pleasant. EYE: no conjunctival injection, pupils equally round and reactive to light ENMT: normal tympanic light reflex, no nasal polyps,no rhinorrhea, no pharyngeal erythema or exudates NECK: full ROM, no thyromegaly RESPIRATORY: clear to auscultation bilaterally with no wheezes, rhonchi or rales, good effort CV: RRR, no m/r/g, no peripheral edema GI: soft, non-tender, non-distended, no hepatosplenomegaly SKIN: warm and dry, no rashes or lesions NEURO: II-XII grossly intact, normal gait, peripheral sensation intact PSYCH: AAOx3, appropriate affect BACK: No spinous process tenderness. Paraspinal tenderness in lumbar  regions. Positive straight leg test sitting on the right.   Assessment and plan:  Right groin pain Likely MSK pain in the setting of acute flare of back pain and severe obesity. UA negative for urine and so it is less likely renal stone. Continue management for acute back flare as noted below. Encourage continued activity. Return precautions advised.  Back pain Patient seems improved from previous visit, though she feels she has made minimal improvement in flare of back pain. She feels strongly that she needs an MRI, however I am unsure how this will change management at this time.  I do not suspect she will be a good surgical candidate for intervention. I discussed this with her, however she would like to move forward with aggressive workup and treatment.  - ambulatory referral to orthopedic surgery - pain control per pain management  Orders Placed This Encounter  Procedures  . Ambulatory referral to Orthopedic Surgery    Referral Priority:   Routine    Referral Type:   Surgical    Referral Reason:   Specialty Services Required    Requested Specialty:   Orthopedic Surgery    Number of Visits Requested:   1  . POCT urinalysis dipstick    No orders of the defined types were placed in this encounter.  [name], MD,MS,  PGY3 07/20/2018 1:16 PM

## 2018-07-17 NOTE — Patient Instructions (Signed)
It was a pleasure seeing you today in our clinic.  A consult was placed to orthopedics at today's visit.  You will receive a call to schedule an appointment. If you do not receive a call within two weeks please call our office so we can place the consult again.  Our clinic's number is [phone]. Please call with questions or concerns about what we discussed today.  Be well, Dr. Burr Medico

## 2018-07-19 ENCOUNTER — Other Ambulatory Visit: Payer: Self-pay | Admitting: Podiatry

## 2018-07-19 DIAGNOSIS — E1151 Type 2 diabetes mellitus with diabetic peripheral angiopathy without gangrene: Secondary | ICD-10-CM

## 2018-07-19 DIAGNOSIS — B353 Tinea pedis: Secondary | ICD-10-CM

## 2018-07-20 ENCOUNTER — Encounter: Payer: Self-pay | Admitting: Student in an Organized Health Care Education/Training Program

## 2018-07-20 DIAGNOSIS — R1031 Right lower quadrant pain: Secondary | ICD-10-CM | POA: Insufficient documentation

## 2018-07-20 NOTE — Assessment & Plan Note (Signed)
Likely MSK pain in the setting of acute flare of back pain and severe obesity. UA negative for urine and so it is less likely renal stone. Continue management for acute back flare as noted below. Encourage continued activity. Return precautions advised.

## 2018-07-20 NOTE — Assessment & Plan Note (Signed)
Patient seems improved from previous visit, though she feels she has made minimal improvement in flare of back pain. She feels strongly that she needs an MRI, however I am unsure how this will change management at this time.  I do not suspect she will be a good surgical candidate for intervention. I discussed this with her, however she would like to move forward with aggressive workup and treatment.  - ambulatory referral to orthopedic surgery

## 2018-07-21 ENCOUNTER — Other Ambulatory Visit: Payer: Self-pay

## 2018-07-21 ENCOUNTER — Encounter: Payer: Self-pay | Admitting: Specialist

## 2018-07-21 ENCOUNTER — Other Ambulatory Visit: Payer: Self-pay | Admitting: *Deleted

## 2018-07-21 ENCOUNTER — Ambulatory Visit (INDEPENDENT_AMBULATORY_CARE_PROVIDER_SITE_OTHER): Payer: Medicare Other | Admitting: Specialist

## 2018-07-21 VITALS — BP 111/72 | HR 55 | Ht 70.0 in | Wt 390.0 lb

## 2018-07-21 DIAGNOSIS — M4726 Other spondylosis with radiculopathy, lumbar region: Secondary | ICD-10-CM

## 2018-07-21 DIAGNOSIS — Z981 Arthrodesis status: Secondary | ICD-10-CM | POA: Diagnosis not present

## 2018-07-21 DIAGNOSIS — J302 Other seasonal allergic rhinitis: Secondary | ICD-10-CM

## 2018-07-21 DIAGNOSIS — M4807 Spinal stenosis, lumbosacral region: Secondary | ICD-10-CM | POA: Diagnosis not present

## 2018-07-21 MED ORDER — FLUTICASONE PROPIONATE 50 MCG/ACT NA SUSP: 2.0000 | Freq: Two times a day (BID) | NASAL | 6 refills | Status: DC

## 2018-07-21 NOTE — Progress Notes (Addendum)
Office Visit Note   Patient: [name]           Date of Birth: 1965/12/13           MRN: [id number] Visit Date: 07/21/2018              Requested by: Lind Covert, MD [address] [place],  [place] [address] PCP: [name], MD   Assessment & Plan: Visit Diagnoses:  1. Other spondylosis with radiculopathy, lumbar region   2. Spinal stenosis of lumbosacral region   3. S/P lumbar fusion   4. Morbid (severe) obesity due to excess calories ([place])     Plan: With patient's ongoing and worsening pain I recommending that we repeat CT lumbar spine with contrast and compared to previous study done July 14, 2017.  Patient will follow-up with Dr. Louanne Skye after completion to discuss results and further treatment options.  Again patient understands that as Dr. Louanne Skye discussed with her last office visit that she is not a good surgical candidate due to morbid obesity and multiple medical issues.  She is currently in pain management and hopefully they can offer her treatment options as well.  They are prescribing her hydrocodone, baclofen, gabapentin and Voltaren gel.  Follow-Up Instructions: Return in about 3 weeks (around 08/11/2018) for with Dr Louanne Skye to review CT lumbar.   Orders:  Orders Placed This Encounter  Procedures  . CT LUMBAR SPINE W CONTRAST   No orders of the defined types were placed in this encounter.     Procedures: No procedures performed   Clinical Data: No additional findings.   Subjective: Chief Complaint  Patient presents with  . Lower Back - Pain    HPI 53 year old black female comes in today with complaints of worsening low back pain and right greater left lower extremity radiculopathy.  She is status post multilevel lumbar fusion L2-L5 with Dr. Louanne Skye.  Patient states that she has had 3 surgeries.  She had CT lumbar spine performed July 14, 2017 and that report showed adjacent segment disease L1-2 with the space narrowing and mild bulging.   Facet degeneration with bony and ligamentous hypertrophy.  Moderate to advanced spinal stenosis.  Spinal canal narrowing worsens with extension based on myelography.  Bilateral foraminal narrowing that is mild to moderate.  Solid fusion L2-L5.  Seen by Dr. Donavan Burnet December 2019 and patient advised that she is not a good operative candidate.  Patient also recently seen by her primary care physician Dr. Tressia Danas last week who referred her back to our office.    Review of Systems No current acute cardiac or pulmonary issues.  Objective: Vital Signs: LMP 04/10/2006   Physical Exam Constitutional:      Comments: Morbidly obese  HENT:     Head: Normocephalic.  Eyes:     Extraocular Movements: Extraocular movements intact.     Pupils: Pupils are equal, round, and reactive to light.  Pulmonary:     Effort: No respiratory distress.  Musculoskeletal:     Comments: Gait is somewhat antalgic.  Positive right straight leg raise.  Positive left contralateral straight leg raise.  No focal motor deficits.  Neurological:     Mental Status: She is oriented to person, place, and time.     Ortho Exam  Specialty Comments:  No specialty comments available.  Imaging: No results found.   PMFS History: Patient Active Problem List   Diagnosis Date Noted  . Right groin pain 07/20/2018  . Depressed  mood 07/13/2018  . Mouth pain 06/02/2018  . Paroxysmal SVT (supraventricular tachycardia) ([place]) 03/20/2018  . Pure hypercholesterolemia 03/20/2018  . Muscle cramping 02/25/2018  . Chest pressure 02/20/2018  . Nodule of upper lobe of right lung 02/13/2018  . Falls, subsequent encounter 01/30/2018  . Open wound of left foot 11/04/2017  . Essential hypertension 10/27/2017  . Solitary pulmonary nodule 10/07/2017  . Acute on chronic right-sided congestive heart failure ([place])   . Restrictive lung disease secondary to obesity 10/01/2017  . Polycythemia, secondary 10/01/2017  . Chronic viral hepatitis  B without delta-agent ([place]) 07/08/2017  . COPD (chronic obstructive pulmonary disease) with chronic bronchitis ([place]) 06/27/2017  . Chronic kidney disease (CKD), stage IV (severe) ([place]) 01/14/2017  . Type 2 diabetes mellitus with stage 3 chronic kidney disease, with long-term current use of insulin ([place]) 12/12/2016  . Urge incontinence of urine 12/05/2016  . Trigger index finger of left hand 07/15/2016  . Back pain 04/07/2014  . Obesity 10/02/2012  . Chronic diastolic heart failure ([place]) 04/07/2012  . GERD 01/26/2010  . Hypertension 08/04/2008  . Obstructive sleep apnea treated with BiPAP 02/03/2008  . FIBROCYSTIC BREAST DISEASE 10/28/2006  . Hyperlipidemia 03/27/2006  . Obesity hypoventilation syndrome ([place]) 03/27/2006  . Former tobacco use 03/27/2006  . Depression with anxiety 03/27/2006   Past Medical History:  Diagnosis Date  . Anxiety   . Arthritis 04-10-11   hips, shoulders, back  . Asthma   . Bipolar disorder ([place])   . Cancer ([place]) 1993   cervical, no treatment done, went away per pt  . Cervical dysplasia or atypia 04-10-11   '93- once dx.-got pregnant-no intervention, then postpartum, no dysplasia found  . CHF (congestive heart failure) ([place])    no cardiologist 2014 dx, none now  . Chronic kidney disease    stage 3 kidney disease  . 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 Pathology showed Condyloma, gluteal CONDYLOMA ACUMINATUM.   [name] Kitchen COPD (chronic obstructive pulmonary disease) ([place])   . Diabetes mellitus without complication ([place])   . Dyspnea    with actity, sitting  . Fall 12/16/2016  . Fibrocystic breast disease   . GERD (gastroesophageal reflux disease)   . Hepatitis    hep B-count is low at present,doesn't register  . Hypercalcemia   . Hyperlipidemia   . Hypertension   . Lung nodule 11/2017   right and left lung  . Migraine headache    none recent  . Morbid obesity ([place]) 03/27/2006  . Neuropathy   . Pneumonia    walking pneu 15  yrs. ago  . Skin lesion 03/15/2011   In gluteal crease now s/p removal by Dr. [name] of General Surgery on 3/12. Path shows condyloma.     . Sleep apnea 04-10-11   uses cpap, pt does not know settings  . Sleep apnea   . Trigger finger    left third    Family History  Problem Relation Age of Onset  . Pulmonary embolism Mother   . Stroke Father   . Alcohol abuse Father   . Pneumonia Father   . Cancer Maternal Aunt        breast  . Breast cancer Maternal Aunt   . Asthma Other   . COPD Other   . Hypertension Other   . Diabetes Other   . Asthma Sister   . Depression Sister   . Arthritis Sister   . Sickle cell anemia Son     Past  Surgical History:  Procedure Laterality Date  . ANAL FISTULECTOMY  04/17/2011   Procedure: FISTULECTOMY ANAL;  Surgeon: Imogene Burn. [name], MD;  Location: [facility];  Service: General;  Laterality: N/A;  Excision of Condyloma Gluteal Cleft   . BACK SURGERY  04-10-11   x5-Lumbar fusion-retained hardware.(Dr. Louanne Skye)  . BREAST CYST EXCISION     bilateral breast, 3 cysts removed from each breast  . BREAST EXCISIONAL BIOPSY Right    x 3  . BREAST EXCISIONAL BIOPSY Left    x 3  . CARPAL TUNNEL RELEASE Bilateral   . Cervical biospy    . COLONOSCOPY WITH PROPOFOL N/A 07/06/2015   Procedure: COLONOSCOPY WITH PROPOFOL;  Surgeon: [name], MD;  Location: [facility];  Service: Endoscopy;  Laterality: N/A;  . DOPPLER ECHOCARDIOGRAPHY  04/06/2012   AT [place] 55-60%  . FRACTURE SURGERY    . I&D EXTREMITY Left 12/18/2016   Procedure: IRRIGATION AND DEBRIDEMENT LEFT FOOT, CLOSURE;  Surgeon: [name], MD;  Location: [place];  Service: Orthopedics;  Laterality: Left;  [name] Kitchen MULTIPLE TOOTH EXTRACTIONS    . NECK SURGERY  04-10-11   x3- cervical fusion with plating and screws-Dr. [name]  . TEE WITHOUT CARDIOVERSION N/A 04/06/2012   Procedure: TRANSESOPHAGEAL ECHOCARDIOGRAM (TEE);  Surgeon: [name], MD;  Location: [facility] ENDOSCOPY;  Service: Cardiovascular;   Laterality: N/A;  . TRIGGER FINGER RELEASE Right    middle finger  . TRIGGER FINGER RELEASE Left 06/28/2016   Procedure: RELEASE TRIGGER FINGER LEFT 3RD FINGER;  Surgeon: [name], MD;  Location: [place];  Service: Orthopedics;  Laterality: Left;  . TRIGGER FINGER RELEASE Right 06/12/2016   Procedure: RIGHT INDEX FINGER TRIGGER RELEASE;  Surgeon: [name], MD;  Location: [place];  Service: Orthopedics;  Laterality: Right;  . TRIGGER FINGER RELEASE Right 01/19/2018   Procedure: RIGHT RING FINGER TRIGGER FINGER RELEASE;  Surgeon: [name], MD;  Location: [place];  Service: Orthopedics;  Laterality: Right;   Social History   Occupational History  . Not on file  Tobacco Use  . Smoking status: Former Smoker    Packs/day: 1.00    Years: 40.00    Pack years: 40.00    Types: Cigarettes    Start date: 01/28/1977    Quit date: 09/07/2017    Years since quitting: 0.8  . Smokeless tobacco: Never Used  . Tobacco comment: Quit prior to hospitalization using low dose Chantix  Substance and Sexual Activity  . Alcohol use: No    Alcohol/week: 0.0 standard drinks  . Drug use: Not Currently    Types: Marijuana, "Crack" cocaine    Comment: hx marijuana usemany years ago, Crack -none since 11/2015  . Sexual activity: Not Currently    Partners: Male    Birth control/protection: Post-menopausal

## 2018-07-22 NOTE — Telephone Encounter (Signed)
I tried to call patient, no answer and no voicemail.

## 2018-07-23 NOTE — Telephone Encounter (Signed)
Virtual-TELE-VISIT per patient/ smartphone/ consent/ my chart/ pre reg completed

## 2018-07-24 ENCOUNTER — Other Ambulatory Visit: Payer: Self-pay

## 2018-07-24 ENCOUNTER — Encounter: Payer: Self-pay | Admitting: Cardiology

## 2018-07-24 ENCOUNTER — Telehealth (INDEPENDENT_AMBULATORY_CARE_PROVIDER_SITE_OTHER): Payer: Medicare Other | Admitting: Cardiology

## 2018-07-24 VITALS — BP 137/88 | HR 86 | Ht 70.0 in | Wt 390.4 lb

## 2018-07-24 DIAGNOSIS — I10 Essential (primary) hypertension: Secondary | ICD-10-CM

## 2018-07-24 DIAGNOSIS — I5032 Chronic diastolic (congestive) heart failure: Secondary | ICD-10-CM | POA: Diagnosis not present

## 2018-07-24 DIAGNOSIS — Z7189 Other specified counseling: Secondary | ICD-10-CM | POA: Diagnosis not present

## 2018-07-24 DIAGNOSIS — I471 Supraventricular tachycardia: Secondary | ICD-10-CM | POA: Diagnosis not present

## 2018-07-24 DIAGNOSIS — Z6841 Body Mass Index (BMI) 40.0 and over, adult: Secondary | ICD-10-CM

## 2018-07-24 NOTE — Patient Instructions (Signed)

## 2018-07-24 NOTE — Progress Notes (Signed)
Virtual Visit via Telephone Note   This visit type was conducted due to national recommendations for restrictions regarding the COVID-19 Pandemic (e.g. social distancing) in an effort to limit this patient's exposure and mitigate transmission in our community.  Due to her co-morbid illnesses, this patient is at least at moderate risk for complications without adequate follow up.  This format is felt to be most appropriate for this patient at this time.  The patient did not have access to video technology/had technical difficulties with video requiring transitioning to audio format only (telephone).  All issues noted in this document were discussed and addressed.  No physical exam could be performed with this format.  Please refer to the patient's chart for her  consent to telehealth for [facility].   Date:  5/78/4696   ID:  [name], DOB [birth year], MRN [id number]  Patient Location: Home Provider Location: Office  PCP:  [name], MD  Cardiologist:  [name], MD  Electrophysiologist:  None   Evaluation Performed:  Follow-Up Visit  Chief Complaint:  Follow up  History of Present Illness:    LLUVIA GWYNNE is a 53 y.o. female with chronic diastolic heart failure, chronic hypoxic respiratory failure, COPD, OSA on PAP, morbid obesity, paroxysmal SVT, type II diabetes on insulin, CKD stage 3, hypertension, hyperlipidemia    The patient does not have symptoms concerning for COVID-19 infection (fever, chills, cough, or new shortness of breath).   Today:  Having back pain worse than usual but otherwise doing well. Using TENS unit, has imaging planned to evaluate. Doesn't want to go through back surgery again if she can avoid it.  Breathing is overall excellent, off O2 the majority of the time for a few months. Has been losing weight with diet, now 390 lbs. Only rare breathing spells/palpitations. Notes that she needs O2 if her wt is over 400 lbs.  Finished classes  for bariatric surgery, waiting for post Covid time to schedule surgery. She is very motivated for weight loss and lifestyle change.   No swelling. Uses metolazone Monday and Thursday, no extra required. Weights have stable at 390 this week.  Denies chest pain, shortness of breath at rest, PND, orthopnea. Stable to improving LE edema. No unexpected weight gain. No syncope, rare palpitations.   Past Medical History:  Diagnosis Date  . Anxiety   . Arthritis 04-10-11   hips, shoulders, back  . Asthma   . Bipolar disorder ([place])   . Cancer ([place]) 1993   cervical, no treatment done, went away per pt  . Cervical dysplasia or atypia 04-10-11   '93- once dx.-got pregnant-no intervention, then postpartum, no dysplasia found  . CHF (congestive heart failure) ([place])    no cardiologist 2014 dx, none now  . Chronic kidney disease    stage 3 kidney disease  . 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 Pathology showed Condyloma, gluteal CONDYLOMA ACUMINATUM.   [name] Kitchen COPD (chronic obstructive pulmonary disease) ([place])   . Diabetes mellitus without complication ([place])   . Dyspnea    with actity, sitting  . Fall 12/16/2016  . Fibrocystic breast disease   . GERD (gastroesophageal reflux disease)   . Hepatitis    hep B-count is low at present,doesn't register  . Hypercalcemia   . Hyperlipidemia   . Hypertension   . Lung nodule 11/2017   right and left lung  . Migraine headache    none recent  . Morbid obesity ([place]) 03/27/2006  .  Neuropathy   . Pneumonia    walking pneu 15 yrs. ago  . Skin lesion 03/15/2011   In gluteal crease now s/p removal by Dr. [name] of General Surgery on 3/12. Path shows condyloma.     . Sleep apnea 04-10-11   uses cpap, pt does not know settings  . Sleep apnea   . Trigger finger    left third   Past Surgical History:  Procedure Laterality Date  . ANAL FISTULECTOMY  04/17/2011   Procedure: FISTULECTOMY ANAL;  Surgeon: Imogene Burn. [name], MD;  Location:  [facility];  Service: General;  Laterality: N/A;  Excision of Condyloma Gluteal Cleft   . BACK SURGERY  04-10-11   x5-Lumbar fusion-retained hardware.(Dr. Louanne Skye)  . BREAST CYST EXCISION     bilateral breast, 3 cysts removed from each breast  . BREAST EXCISIONAL BIOPSY Right    x 3  . BREAST EXCISIONAL BIOPSY Left    x 3  . CARPAL TUNNEL RELEASE Bilateral   . Cervical biospy    . COLONOSCOPY WITH PROPOFOL N/A 07/06/2015   Procedure: COLONOSCOPY WITH PROPOFOL;  Surgeon: [name], MD;  Location: [facility];  Service: Endoscopy;  Laterality: N/A;  . DOPPLER ECHOCARDIOGRAPHY  04/06/2012   AT [place] 55-60%  . FRACTURE SURGERY    . I&D EXTREMITY Left 12/18/2016   Procedure: IRRIGATION AND DEBRIDEMENT LEFT FOOT, CLOSURE;  Surgeon: [name], MD;  Location: [facility];  Service: Orthopedics;  Laterality: Left;  [name] Kitchen MULTIPLE TOOTH EXTRACTIONS    . NECK SURGERY  04-10-11   x3- cervical fusion with plating and screws-Dr. [name]  . TEE WITHOUT CARDIOVERSION N/A 04/06/2012   Procedure: TRANSESOPHAGEAL ECHOCARDIOGRAM (TEE);  Surgeon: [name], MD;  Location: [facility] ENDOSCOPY;  Service: Cardiovascular;  Laterality: N/A;  . TRIGGER FINGER RELEASE Right    middle finger  . TRIGGER FINGER RELEASE Left 06/28/2016   Procedure: RELEASE TRIGGER FINGER LEFT 3RD FINGER;  Surgeon: [name], MD;  Location: [facility];  Service: Orthopedics;  Laterality: Left;  . TRIGGER FINGER RELEASE Right 06/12/2016   Procedure: RIGHT INDEX FINGER TRIGGER RELEASE;  Surgeon: [name], MD;  Location: [facility];  Service: Orthopedics;  Laterality: Right;  . TRIGGER FINGER RELEASE Right 01/19/2018   Procedure: RIGHT RING FINGER TRIGGER FINGER RELEASE;  Surgeon: [name], MD;  Location: [facility];  Service: Orthopedics;  Laterality: Right;     Current Meds  Medication Sig  . ACCU-CHEK AVIVA PLUS test strip TEST 4 TIMES DAILY  . ACCU-CHEK SOFTCLIX LANCETS lancets TEST 4 TIMES DAILY  . Acetaminophen (TYLENOL EXTRA STRENGTH  PO) Take 1,000 mg by mouth 2 (two) times a day.  [name] Kitchen ammonium lactate (AMLACTIN) 12 % lotion Apply 1 application topically 2 (two) times daily as needed for dry skin.  [name] Kitchen atorvastatin (LIPITOR) 40 MG tablet Take 1 tablet (40 mg total) by mouth daily.  . baclofen (LIORESAL) 10 MG tablet Take 1 tablet (10 mg total) by mouth 3 (three) times daily as needed for muscle spasms.  . blood glucose meter kit and supplies KIT Dispense based on patient and insurance preference. Use up to four times daily as directed. (FOR ICD-9 250.00, 250.01).  . busPIRone (BUSPAR) 10 MG tablet TAKE 1 TABLET BY MOUTH THREE TIMES A DAY  . Capsaicin-Menthol (PAIN RELIEF EX) Apply 1 application topically 3 (three) times daily as needed (shoulder pain).  . citalopram (CELEXA) 40 MG tablet Take 1 tablet (40 mg total) by mouth daily.  [name] Kitchen  diclofenac sodium (VOLTAREN) 1 % GEL APPLY 2 GRAMS TO AFFECTED AREA 4 TIMES A DAY  . diltiazem (CARDIZEM CD) 120 MG 24 hr capsule Take 1 capsule (120 mg total) by mouth 2 (two) times daily.  [name] Kitchen docusate sodium (COLACE) 100 MG capsule Take 200 mg by mouth 2 (two) times daily.  . febuxostat (ULORIC) 40 MG tablet Take 40 mg by mouth daily.  . fluticasone (FLONASE) 50 MCG/ACT nasal spray Place 2 sprays into both nostrils 2 (two) times daily.  . Fluticasone-Umeclidin-Vilant (TRELEGY ELLIPTA) 100-62.5-25 MCG/INH AEPB Inhale 1 puff into the lungs daily.  . furosemide (LASIX) 40 MG tablet Take 2 tablets (80 mg total) by mouth 2 (two) times daily.  [name] Kitchen gabapentin (NEURONTIN) 300 MG capsule TAKE 2 CAPSULES (600 MG TOTAL) BY MOUTH 3 (THREE) TIMES DAILY.  [name] Kitchen HYDROcodone-acetaminophen (NORCO) 7.5-325 MG tablet Take 1 tablet by mouth every 6 (six) hours as needed for moderate pain.  . hydrOXYzine (ATARAX/VISTARIL) 10 MG tablet Take 1 tablet (10 mg total) by mouth daily as needed.  . hydrOXYzine (VISTARIL) 100 MG capsule Take 1 capsule (100 mg total) by mouth 3 (three) times daily as needed for itching.  . Insulin  Glargine (LANTUS SOLOSTAR) 100 UNIT/ML Solostar Pen Inject 22 Units into the skin daily before breakfast.  . insulin lispro (HUMALOG KWIKPEN) 100 UNIT/ML KwikPen Inject 0.06 mLs (6 Units total) into the skin 3 (three) times daily before meals.  . Insulin Pen Needle (B-D UF III MINI PEN NEEDLES) 31G X 5 MM MISC CHECK SUGARS 4 TIMES A DAY BEFORE MEALS AND AT BEDTIME.  [name] Kitchen JARDIANCE 25 MG TABS tablet TAKE 1 TABLET BY MOUTH EVERY DAY  . ketoconazole (NIZORAL) 2 % cream APPLY 1 FINGERTIP AMOUNT TO EACH FOOT DAILY.  [name] Kitchen loratadine (CLARITIN) 10 MG tablet Take 1 tablet (10 mg total) by mouth daily.  [name] Kitchen losartan (COZAAR) 100 MG tablet Take 1 tablet (100 mg total) by mouth daily.  . metolazone (ZAROXOLYN) 2.5 MG tablet Take 1 tablet (2.5 mg total) by mouth 2 (two) times a week. TAKE 1 TABLET TWICE WEEKLY ON MONDAYS AND THURSDAYS ONLY  . montelukast (SINGULAIR) 10 MG tablet TAKE 1 TABLET AT BEDTIME  . Multiple Vitamins-Minerals (MULTIVITAMIN WITH MINERALS) tablet Take 1 tablet by mouth daily.  . mupirocin ointment (BACTROBAN) 2 % APPLY TO AFFECTED AREA TWICE A DAY  . NARCAN 4 MG/0.1ML LIQD nasal spray kit   . NON FORMULARY Uses a C-PAP at bedtime  . nystatin (MYCOSTATIN) 100000 UNIT/ML suspension Take 5 mLs (500,000 Units total) by mouth 4 (four) times daily. Swish and swallow. (Patient taking differently: Take 5 mLs by mouth daily as needed (mouth sores). Swish and swallow.)  . omeprazole (PRILOSEC) 20 MG capsule Take 1 capsule (20 mg total) by mouth daily as needed.  [name] Kitchen oxybutynin (DITROPAN XL) 10 MG 24 hr tablet Take 1 tablet (10 mg total) by mouth at bedtime.  . OXYGEN Inhale 2 L into the lungs continuous.  . potassium chloride SA (KLOR-CON M20) 20 MEQ tablet Take 1 tablet (20 mEq total) by mouth 2 (two) times a week. Take 1 tablet twice weekly on Monday and Thursday.  [name] Kitchen PROAIR HFA 108 (90 Base) MCG/ACT inhaler INHALE 2 PUFFS INTO THE LUNGS EVERY 6 (SIX) HOURS AS NEEDED FOR SHORTNESS OF BREATH.  .  promethazine (PHENERGAN) 25 MG tablet Take 1 tablet (25 mg total) by mouth every 6 (six) hours as needed for nausea.  . sertraline (ZOLOFT) 25 MG tablet Take 1 tablet (25  mg total) by mouth daily.  . varenicline (CHANTIX STARTING MONTH PAK) 0.5 MG X 11 & 1 MG X 42 tablet Take one 0.5 mg tablet by mouth once daily for 3 days, then increase to one 0.5 mg tablet twice daily for 4 days, then increase to one 1 mg tablet twice daily.  . Vitamin D, Ergocalciferol, (DRISDOL) 50000 units CAPS capsule Take 50,000 Units by mouth daily. For 30 days     Allergies:   Chantix [varenicline tartrate], Corticosteroids, Methadone hcl, Aspirin, and Levofloxacin   Social History   Tobacco Use  . Smoking status: Former Smoker    Packs/day: 1.00    Years: 40.00    Pack years: 40.00    Types: Cigarettes    Start date: 01/28/1977    Quit date: 09/07/2017    Years since quitting: 0.8  . Smokeless tobacco: Never Used  . Tobacco comment: Quit prior to hospitalization using low dose Chantix  Substance Use Topics  . Alcohol use: No    Alcohol/week: 0.0 standard drinks  . Drug use: Not Currently    Types: Marijuana, "Crack" cocaine    Comment: hx marijuana usemany years ago, Crack -none since 11/2015     Family Hx: The patient's family history includes Alcohol abuse in her father; Arthritis in her sister; Asthma in her sister and another family member; Breast cancer in her maternal aunt; COPD in an other family member; Cancer in her maternal aunt; Depression in her sister; Diabetes in an other family member; Hypertension in an other family member; Pneumonia in her father; Pulmonary embolism in her mother; Sickle cell anemia in her son; Stroke in her father.  ROS:   Please see the history of present illness.    Constitutional: Negative for chills, fever, night sweats, unintentional weight loss  HENT: Negative for ear pain and hearing loss.   Eyes: Negative for loss of vision and eye pain.  Respiratory: Negative  for cough, sputum, wheezing.   Cardiovascular: See HPI. Gastrointestinal: Negative for abdominal pain, melena, and hematochezia.  Genitourinary: Negative for dysuria and hematuria.  Musculoskeletal: Negative for falls and myalgias.  Skin: Negative for itching and rash.  Neurological: Negative for focal weakness, focal sensory changes and loss of consciousness.  Endo/Heme/Allergies: Does not bruise/bleed easily.  All other systems reviewed and are negative.   Prior CV studies:   The following studies were reviewed today: Monitor, stress test, echo 2019  Labs/Other Tests and Data Reviewed:    EKG:  An ECG dated 03/06/18 was personally reviewed today and demonstrated:  nsr  Recent Labs: 10/07/2017: Magnesium 1.9 01/27/2018: B Natriuretic Peptide 36.9; Hemoglobin 14.6; Platelets 344 02/23/2018: ALT 26 03/06/2018: BUN 36; Creatinine, Ser 1.58; Potassium 4.2; Sodium 145; TSH 0.469   Recent Lipid Panel Lab Results  Component Value Date/Time   CHOL 145 10/27/2017 09:44 AM   TRIG 98 10/27/2017 09:44 AM   HDL 55 10/27/2017 09:44 AM   CHOLHDL 2.6 10/27/2017 09:44 AM   CHOLHDL 4.0 04/17/2015 09:17 AM   LDLCALC 70 10/27/2017 09:44 AM   LDLDIRECT 114 (H) 09/15/2012 03:59 PM    Wt Readings from Last 3 Encounters:  07/24/18 (!) 390 lb 6.4 oz (177.1 kg)  07/21/18 (!) 390 lb (176.9 kg)  07/08/18 (!) 395 lb 9.6 oz (179.4 kg)     Objective:    Vital Signs:  BP 137/88   Pulse 86   Ht 5' 10" (1.778 m)   Wt (!) 390 lb 6.4 oz (177.1 kg)   LMP  04/10/2006   BMI 56.02 kg/m    Speaking comfortably on the phone, in [place]:    Chronic diastolic heart failure: Losing weight with lifestyle, breathing/swelling much improved with current regimen. Off O2 as long as weights <400 lbs. -continue metolazone Monday/Thursday dosing, take potassium when she takes metolazone -continue furosemide as ordered -working on diet, weight loss. Congratulated on this. Planning for bariatric  surgery. Can do preoperative eval once date is planned for surgery.  -daily weights -she will call us with any issues.  Paroxysmal SVT: very rare symptoms -continue diltiazem  Type II diabetes with stage 3 CKD: on insulin and jardiance -working on diet and weight loss, which we discussed today  Hypertension: Much better control today compared to prior visits. Was 128/70 at recent PCP visit. -she will continue to monitor. Goal <130/80. Will call if becomes consistently elevated -continue dilitazem, diuretics, losartan  Hypercholesterolemia: continue atorvastatin  COVID-19 Education: The signs and symptoms of COVID-19 were discussed with the patient and how to seek care for testing (follow up with PCP or arrange E-visit). The importance of social distancing was discussed today.  Time:   Today, I have spent 17 minutes with the patient with telehealth technology discussing the above problems.    Patient Instructions  Medication Instructions:  Your Physician recommend you continue on your current medication as directed.    If you need a refill on your cardiac medications before your next appointment, please call your pharmacy.   Lab work: None  Testing/Procedures: None  Follow-Up: At [facility], you and your health needs are our priority.  As part of our continuing mission to provide you with exceptional heart care, we have created designated Provider Care Teams.  These Care Teams include your primary Cardiologist (physician) and Advanced Practice Providers (APPs -  Physician Assistants and Nurse Practitioners) who all work together to provide you with the care you need, when you need it. You will need a follow up appointment in 3 months.  Please call our office 2 months in advance to schedule this appointment.  You may see [name], MD or one of the following Advanced Practice Providers on your designated Care Team:   Rosaria Ferries, PA-C . Jory Sims,  DNP, ANP       Medication Adjustments/Labs and Tests Ordered: Current medicines are reviewed at length with the patient today.  Concerns regarding medicines are outlined above.   Tests Ordered: No orders of the defined types were placed in this encounter.   Medication Changes: No orders of the defined types were placed in this encounter.  Follow Up:  3 mos  Signed, [name], MD  07/24/2018 9:26 AM    [facility]

## 2018-07-27 ENCOUNTER — Ambulatory Visit: Payer: Medicare Other | Admitting: Student in an Organized Health Care Education/Training Program

## 2018-07-29 DIAGNOSIS — M5136 Other intervertebral disc degeneration, lumbar region: Secondary | ICD-10-CM | POA: Diagnosis not present

## 2018-07-29 DIAGNOSIS — G894 Chronic pain syndrome: Secondary | ICD-10-CM | POA: Diagnosis not present

## 2018-07-29 DIAGNOSIS — Z1159 Encounter for screening for other viral diseases: Secondary | ICD-10-CM | POA: Diagnosis not present

## 2018-07-29 DIAGNOSIS — Z79899 Other long term (current) drug therapy: Secondary | ICD-10-CM | POA: Diagnosis not present

## 2018-07-29 DIAGNOSIS — M5030 Other cervical disc degeneration, unspecified cervical region: Secondary | ICD-10-CM | POA: Diagnosis not present

## 2018-08-02 ENCOUNTER — Other Ambulatory Visit: Payer: Self-pay | Admitting: Student in an Organized Health Care Education/Training Program

## 2018-08-02 DIAGNOSIS — F320 Major depressive disorder, single episode, mild: Secondary | ICD-10-CM

## 2018-08-03 ENCOUNTER — Encounter: Payer: Self-pay | Admitting: Specialist

## 2018-08-03 ENCOUNTER — Telehealth: Payer: Self-pay | Admitting: Family Medicine

## 2018-08-03 DIAGNOSIS — G4733 Obstructive sleep apnea (adult) (pediatric): Secondary | ICD-10-CM | POA: Diagnosis not present

## 2018-08-03 DIAGNOSIS — G8929 Other chronic pain: Secondary | ICD-10-CM | POA: Diagnosis not present

## 2018-08-03 DIAGNOSIS — M48061 Spinal stenosis, lumbar region without neurogenic claudication: Secondary | ICD-10-CM | POA: Diagnosis not present

## 2018-08-03 NOTE — Telephone Encounter (Signed)
Called patient and discussed changes being made to her prescriptions. I informed her to stop taking Celexa and that I had refilled her prescriptions for Zoloft and Atarax.  Patient also asked when next pap smear was due and I let her know it was the end of October.

## 2018-08-03 NOTE — Telephone Encounter (Signed)
Called [name] and spoke with her about her medications. Patient report that she is taking both Celexa and Zoloft. I discussed with her the fact that these medications are the same type of medication for depression and that she should not be taking bot. She requests a refill on her Zoloft and says that she will stop taking the other.

## 2018-08-04 ENCOUNTER — Other Ambulatory Visit: Payer: Self-pay

## 2018-08-04 ENCOUNTER — Encounter: Payer: Self-pay | Admitting: *Deleted

## 2018-08-04 ENCOUNTER — Other Ambulatory Visit: Payer: Self-pay | Admitting: Pharmacist

## 2018-08-04 ENCOUNTER — Other Ambulatory Visit: Payer: Self-pay | Admitting: *Deleted

## 2018-08-04 DIAGNOSIS — E876 Hypokalemia: Secondary | ICD-10-CM | POA: Diagnosis not present

## 2018-08-04 DIAGNOSIS — E1122 Type 2 diabetes mellitus with diabetic chronic kidney disease: Secondary | ICD-10-CM

## 2018-08-04 DIAGNOSIS — N183 Chronic kidney disease, stage 3 unspecified: Secondary | ICD-10-CM

## 2018-08-04 DIAGNOSIS — R600 Localized edema: Secondary | ICD-10-CM | POA: Diagnosis not present

## 2018-08-04 DIAGNOSIS — Z794 Long term (current) use of insulin: Secondary | ICD-10-CM

## 2018-08-04 DIAGNOSIS — M5090 Cervical disc disorder, unspecified, unspecified cervical region: Secondary | ICD-10-CM | POA: Diagnosis not present

## 2018-08-04 DIAGNOSIS — I129 Hypertensive chronic kidney disease with stage 1 through stage 4 chronic kidney disease, or unspecified chronic kidney disease: Secondary | ICD-10-CM | POA: Diagnosis not present

## 2018-08-04 DIAGNOSIS — J9621 Acute and chronic respiratory failure with hypoxia: Secondary | ICD-10-CM | POA: Diagnosis not present

## 2018-08-04 DIAGNOSIS — G4733 Obstructive sleep apnea (adult) (pediatric): Secondary | ICD-10-CM | POA: Diagnosis not present

## 2018-08-04 DIAGNOSIS — E559 Vitamin D deficiency, unspecified: Secondary | ICD-10-CM | POA: Diagnosis not present

## 2018-08-04 MED ORDER — ACCU-CHEK AVIVA PLUS VI STRP: ORAL_STRIP | 12 refills | Status: DC

## 2018-08-04 MED ORDER — EMPAGLIFLOZIN 25 MG PO TABS: 25.0000 mg | ORAL_TABLET | Freq: Every day | ORAL | 2 refills | Status: AC

## 2018-08-04 NOTE — Telephone Encounter (Signed)
Contacted patient 08/04/2018 to verify dose of Jardiance that she is taking.

## 2018-08-04 NOTE — Patient Outreach (Signed)
[facility] High Risk Patient with multiple co-morbidities: Morbid Obesity, DM, CKD III, HTN, CHF, Chronic neck and back pain (multiple surgeries). She has been working hard to lose wt and has lost 32#. She is hoping to have wt reduction surgery at some time in the future. She checks her glucose levels 4 times a day, weighs daily, checks her blood pressure. She has improved her diet, her glucose control through her diet counseling. She is becoming more active. She unfortunately started smoking again and she is using Chantix to stop again.   She lives alone, has an aid 7 days a week for 2+ hours a day. Her son is close by and she sees or talks to him daily. She became disabled in 2000. She is able to drive and shops with her aid.  Ms. Bonini does not have advanced directives but wants them!   Mrs. Hingle was a delight to talk with today and she accepted [facility] care management services.  Outpatient Encounter Medications as of 08/04/2018  Medication Sig Note  . ACCU-CHEK AVIVA PLUS test strip TEST 4 TIMES DAILY   . ACCU-CHEK SOFTCLIX LANCETS lancets TEST 4 TIMES DAILY   . Acetaminophen (TYLENOL EXTRA STRENGTH PO) Take 1,000 mg by mouth 2 (two) times a day.   [name] Kitchen ammonium lactate (AMLACTIN) 12 % lotion Apply 1 application topically 2 (two) times daily as needed for dry skin.   [name] Kitchen atorvastatin (LIPITOR) 40 MG tablet Take 1 tablet (40 mg total) by mouth daily.   . baclofen (LIORESAL) 10 MG tablet Take 1 tablet (10 mg total) by mouth 3 (three) times daily as needed for muscle spasms.   . blood glucose meter kit and supplies KIT Dispense based on patient and insurance preference. Use up to four times daily as directed. (FOR ICD-9 250.00, 250.01). 08/04/2018: Have advised pt that this would be more beneficial if taken in the evening.  . busPIRone (BUSPAR) 10 MG tablet TAKE 1 TABLET BY MOUTH THREE TIMES A DAY   . Capsaicin-Menthol (PAIN RELIEF EX) Apply 1 application topically 3 (three) times daily as needed (shoulder pain).    [name] Kitchen diclofenac sodium (VOLTAREN) 1 % GEL APPLY 2 GRAMS TO AFFECTED AREA 4 TIMES A DAY   . diltiazem (CARDIZEM CD) 120 MG 24 hr capsule Take 1 capsule (120 mg total) by mouth 2 (two) times daily.   [name] Kitchen docusate sodium (COLACE) 100 MG capsule Take 200 mg by mouth 2 (two) times daily.   . febuxostat (ULORIC) 40 MG tablet Take 40 mg by mouth daily.   . fluticasone (FLONASE) 50 MCG/ACT nasal spray Place 2 sprays into both nostrils 2 (two) times daily.   . Fluticasone-Umeclidin-Vilant (TRELEGY ELLIPTA) 100-62.5-25 MCG/INH AEPB Inhale 1 puff into the lungs daily.   . furosemide (LASIX) 40 MG tablet Take 2 tablets (80 mg total) by mouth 2 (two) times daily.   [name] Kitchen gabapentin (NEURONTIN) 300 MG capsule TAKE 2 CAPSULES (600 MG TOTAL) BY MOUTH 3 (THREE) TIMES DAILY.   [name] Kitchen HYDROcodone-acetaminophen (NORCO) 7.5-325 MG tablet Take 1 tablet by mouth every 6 (six) hours as needed for moderate pain.   . hydrOXYzine (ATARAX/VISTARIL) 10 MG tablet TAKE 1 TABLET BY MOUTH EVERY DAY AS NEEDED   . hydrOXYzine (VISTARIL) 100 MG capsule Take 1 capsule (100 mg total) by mouth 3 (three) times daily as needed for itching.   . Insulin Glargine (LANTUS SOLOSTAR) 100 UNIT/ML Solostar Pen Inject 22 Units into the skin daily before breakfast. 08/04/2018: Dose has been reduced to 20  units.  . insulin lispro (HUMALOG KWIKPEN) 100 UNIT/ML KwikPen Inject 0.06 mLs (6 Units total) into the skin 3 (three) times daily before meals.   . Insulin Pen Needle (B-D UF III MINI PEN NEEDLES) 31G X 5 MM MISC CHECK SUGARS 4 TIMES A DAY BEFORE MEALS AND AT BEDTIME.   [name] Kitchen JARDIANCE 25 MG TABS tablet TAKE 1 TABLET BY MOUTH EVERY DAY   . ketoconazole (NIZORAL) 2 % cream APPLY 1 FINGERTIP AMOUNT TO EACH FOOT DAILY.   [name] Kitchen loratadine (CLARITIN) 10 MG tablet Take 1 tablet (10 mg total) by mouth daily.   [name] Kitchen losartan (COZAAR) 100 MG tablet Take 1 tablet (100 mg total) by mouth daily.   . metolazone (ZAROXOLYN) 2.5 MG tablet Take 1 tablet (2.5 mg total) by mouth 2 (two)  times a week. TAKE 1 TABLET TWICE WEEKLY ON MONDAYS AND THURSDAYS ONLY   . montelukast (SINGULAIR) 10 MG tablet TAKE 1 TABLET AT BEDTIME   . Multiple Vitamins-Minerals (MULTIVITAMIN WITH MINERALS) tablet Take 1 tablet by mouth daily.   [name] Kitchen NARCAN 4 MG/0.1ML LIQD nasal spray kit    . NON FORMULARY Uses a C-PAP at bedtime   . nystatin (MYCOSTATIN) 100000 UNIT/ML suspension Take 5 mLs (500,000 Units total) by mouth 4 (four) times daily. Swish and swallow. (Patient taking differently: Take 5 mLs by mouth daily as needed (mouth sores). Swish and swallow.)   . Omega-3 Fatty Acids (FISH OIL) 1000 MG CAPS Take 1,000 capsules by mouth once.   [name] Kitchen omeprazole (PRILOSEC) 20 MG capsule Take 1 capsule (20 mg total) by mouth daily as needed.   [name] Kitchen oxybutynin (DITROPAN XL) 10 MG 24 hr tablet Take 1 tablet (10 mg total) by mouth at bedtime.   . potassium chloride SA (KLOR-CON M20) 20 MEQ tablet Take 1 tablet (20 mEq total) by mouth 2 (two) times a week. Take 1 tablet twice weekly on Monday and Thursday.   [name] Kitchen PROAIR HFA 108 (90 Base) MCG/ACT inhaler INHALE 2 PUFFS INTO THE LUNGS EVERY 6 (SIX) HOURS AS NEEDED FOR SHORTNESS OF BREATH.   . sertraline (ZOLOFT) 25 MG tablet TAKE 1 TABLET BY MOUTH EVERY DAY   . varenicline (CHANTIX STARTING MONTH PAK) 0.5 MG X 11 & 1 MG X 42 tablet Take one 0.5 mg tablet by mouth once daily for 3 days, then increase to one 0.5 mg tablet twice daily for 4 days, then increase to one 1 mg tablet twice daily.   . mupirocin ointment (BACTROBAN) 2 % APPLY TO AFFECTED AREA TWICE A DAY (Patient not taking: Reported on 08/04/2018)   . promethazine (PHENERGAN) 25 MG tablet Take 1 tablet (25 mg total) by mouth every 6 (six) hours as needed for nausea. (Patient not taking: Reported on 08/04/2018)   . Vitamin D, Ergocalciferol, (DRISDOL) 50000 units CAPS capsule Take 50,000 Units by mouth daily. For 30 days 08/04/2018: Now taking only 5,000 units daily.  . [DISCONTINUED] OXYGEN Inhale 2 L into the lungs continuous.     Facility-Administered Encounter Medications as of 08/04/2018  Medication  . technetium tetrofosmin (TC-MYOVIEW) injection 00.1 millicurie   Fall Risk  08/04/2018 07/17/2018 05/05/2018 03/24/2018 02/23/2018  Falls in the past year? 1 0 0 1 1  Number falls in past yr: 1 - - 1 1  Injury with Fall? 1 - - 1 1  Comment - - - - -  Risk Factor Category  - - - - -  Comment - - - - -  Risk for fall due to :  History of fall(s);Medication side effect;Mental status change - - - -  Follow up Falls evaluation completed - - - -   Depression screen [facility] 2/9 08/04/2018 05/05/2018 03/24/2018 03/06/2018 02/23/2018  Decreased Interest 0 0 0 0 0  Down, Depressed, Hopeless 0 0 0 0 0  PHQ - 2 Score 0 0 0 0 0  Altered sleeping - - - - -  Tired, decreased energy - - - - -  Change in appetite - - - - -  Feeling bad or failure about yourself  - - - - -  Trouble concentrating - - - - -  Moving slowly or fidgety/restless - - - - -  Suicidal thoughts - - - - -  PHQ-9 Score - - - - -  Difficult doing work/chores - - - - -  Some recent data might be hidden   [facility] CM Care Plan Problem One     Most Recent Value  Care Plan Problem One  No Advanced Directives or MOST form.  Role Documenting the Problem One  Care Management [place] for Problem One  Active  [facility] Long Term Goal   Pt will complete her Advanced directives and MOST form within the next 60 days.  [facility] Long Term Goal Start Date  08/04/18  Interventions for Problem One Long Term Goal  Discussed Afvanced Directives and MOST form. Pt acknowledges their importance and wants to complete these.  [facility] CM Short Term Goal #1   Pt will recieve the documents and ask questions at our next scheuduled appointment on 09/07/18.  [facility] CM Short Term Goal #1 Start Date  08/04/18    [facility] CM Care Plan Problem Two     Most Recent Value  Care Plan Problem Two  Multiple co-morbidities (DM, CHF, HTN) that require monitoring and medication management.  Role Documenting the Problem  Two  Care Management Coordinator  Care Plan for Problem Two  Active  Interventions for Problem Two Long Term Goal   Pt reviewed her monitoring regimen and values today. Encouraged continuation and praised for doing this for herself!  [facility] Long Term Goal  Pt will report her glucose levels, wts and BP readings on each monthly call over the next 90 days.  [facility] Long Term Goal Start Date  08/04/18    [facility] CM Care Plan Problem Three     Most Recent Value  Care Plan Problem Three  Hgb A1C 9.4 in June.  Role Documenting the Problem Three  Care Management Coordinator  Care Plan for Problem Three  Active  [facility] Long Term Goal   HgbA1C will be <8.0 within the next 90 days.  [facility] Long Term Goal Start Date  08/04/18  Interventions for Problem Three Long Term Goal  Reviewed recent levels which high was 10.4 and low was 7.2 in last year. Pt reports she received a steroid injection which caused long term hyperglycemia. Now on contraindication list. Sending [employer] to review and follow.     We will talk monthly. Pt will call me with any change in her medical status or for questions.  [name]. Myrtie Neither, MSN, [facility] Gerontological Nurse Practitioner [facility] Care Management [phone]

## 2018-08-06 ENCOUNTER — Telehealth: Payer: Self-pay | Admitting: Adult Health

## 2018-08-06 ENCOUNTER — Other Ambulatory Visit: Payer: Self-pay

## 2018-08-06 DIAGNOSIS — E1151 Type 2 diabetes mellitus with diabetic peripheral angiopathy without gangrene: Secondary | ICD-10-CM

## 2018-08-06 DIAGNOSIS — G4733 Obstructive sleep apnea (adult) (pediatric): Secondary | ICD-10-CM | POA: Diagnosis not present

## 2018-08-06 DIAGNOSIS — B353 Tinea pedis: Secondary | ICD-10-CM

## 2018-08-06 DIAGNOSIS — J9621 Acute and chronic respiratory failure with hypoxia: Secondary | ICD-10-CM | POA: Diagnosis not present

## 2018-08-06 DIAGNOSIS — M5090 Cervical disc disorder, unspecified, unspecified cervical region: Secondary | ICD-10-CM | POA: Diagnosis not present

## 2018-08-06 NOTE — Telephone Encounter (Signed)
Script Screening patients for COVID-19 and reviewing new operational procedures  Greeting - The reason I am calling is to share with you some new changes to our processes that are designed to help [place] keep everyone safe. Is now a good time to speak with you? Patient says "no' - ask them when you can call back and let them know it's important to do this prior to their appointment.  Patient says "yes" - Doristine Devoid, Mrs Behrens the first thing I need to do is ask you some screening Questions.  1. To the best of your knowledge, have you been in close contact with any one with a confirmed diagnosis of COVID 19? o No - proceed to next question  2. Have you had any one or more of the following: fever, chills, cough, shortness of breath or any flu-like symptoms? o No - proceed to next question  3. Have you been diagnosed with or have a previous diagnosis of COVID 19? o No - proceed to next question  4. I am going to go over a few other symptoms with you. Please let me know if you are experiencing any of the following: . Ear, nose or throat discomfort . A sore throat . Headache . Muscle pain . Diarrhea . Loss of taste or smell o No - proceed to next question  Thank you for answering these questions. Please know we will ask you these questions or similar questions when you arrive for your appointment and again it's how we are keeping everyone safe. Also, to keep you safe, please use the provided hand sanitizer when you enter the building. Mrs Ridgeway, we are asking everyone in the building to wear a mask because they help [place] prevent the spread of germs. Do you have a mask of your own, if not, we are happy to provide one for you. The last thing I want to go over with you is the no visitor guidelines. This means no one can attend the appointment with you unless you need physical assistance. I understand this may be different from your past appointments and I know this may be difficult but please  know if someone is driving you we are happy to call them for you once your appointment is over.  [INSERT Warren  (Insert pt name) I've given you a lot of information, what questions do you have about what I've talked about today or your appointment tomorrow?  Benjie Karvonen, CMA

## 2018-08-07 ENCOUNTER — Other Ambulatory Visit: Payer: Self-pay

## 2018-08-07 ENCOUNTER — Ambulatory Visit (INDEPENDENT_AMBULATORY_CARE_PROVIDER_SITE_OTHER)
Admission: RE | Admit: 2018-08-07 | Discharge: 2018-08-07 | Disposition: A | Discharge status: Home / Self Care | Payer: Medicare Other | Source: Ambulatory Visit | Attending: Adult Health | Admitting: Adult Health

## 2018-08-07 DIAGNOSIS — R911 Solitary pulmonary nodule: Secondary | ICD-10-CM

## 2018-08-07 DIAGNOSIS — R918 Other nonspecific abnormal finding of lung field: Secondary | ICD-10-CM | POA: Diagnosis not present

## 2018-08-07 MED ORDER — AMMONIUM LACTATE 12 % EX LOTN: 1.0000 "application " | TOPICAL_LOTION | Freq: Two times a day (BID) | CUTANEOUS | 0 refills | Status: DC | PRN

## 2018-08-11 ENCOUNTER — Other Ambulatory Visit: Payer: Self-pay

## 2018-08-11 ENCOUNTER — Other Ambulatory Visit: Payer: Self-pay | Admitting: Pulmonary Disease

## 2018-08-11 ENCOUNTER — Encounter: Payer: Self-pay | Admitting: Family Medicine

## 2018-08-11 NOTE — Progress Notes (Signed)
Called and spoke pt. Informed her of the results and recs per TP. Order placed for PFT. This has been scheduled for 7/21 and OV with Dr. Valeta Harms also on 7/21. Pt aware, nothing further needed.

## 2018-08-12 MED ORDER — TRELEGY ELLIPTA 100-62.5-25 MCG/INH IN AEPB: 1.0000 | INHALATION_SPRAY | Freq: Every day | RESPIRATORY_TRACT | 1 refills | Status: AC

## 2018-08-13 ENCOUNTER — Other Ambulatory Visit: Payer: Self-pay | Admitting: Student in an Organized Health Care Education/Training Program

## 2018-08-14 ENCOUNTER — Other Ambulatory Visit (HOSPITAL_COMMUNITY)
Admission: RE | Admit: 2018-08-14 | Discharge: 2018-08-14 | Disposition: A | Discharge status: Home / Self Care | Payer: Medicare Other | Source: Ambulatory Visit | Attending: Pulmonary Disease | Admitting: Pulmonary Disease

## 2018-08-14 DIAGNOSIS — Z1159 Encounter for screening for other viral diseases: Secondary | ICD-10-CM | POA: Insufficient documentation

## 2018-08-14 LAB — SARS CORONAVIRUS 2 (TAT 6-24 HRS): SARS Coronavirus 2: NEGATIVE

## 2018-08-17 ENCOUNTER — Encounter: Payer: Self-pay | Admitting: Family Medicine

## 2018-08-17 MED ORDER — VARENICLINE TARTRATE 0.5 MG PO TABS: 0.5000 mg | ORAL_TABLET | Freq: Two times a day (BID) | ORAL | 1 refills | Status: DC

## 2018-08-17 NOTE — Telephone Encounter (Signed)
Spoke with Mrs Robart about her Celexa prescription. She is taking Zoloft 25 mg and so should not be taking both Zoloft and Celexa. She agrees to continue taking the Zoloft. I will not fill her Celexa request.

## 2018-08-17 NOTE — Telephone Encounter (Signed)
Spoke with Mrs. Isaac this morning. She reports that she want to continue on the 0.5 mg Chantix because the 1 mg causes her to hallucinate. 0.5 mg refilled.

## 2018-08-18 ENCOUNTER — Other Ambulatory Visit: Payer: Self-pay

## 2018-08-18 ENCOUNTER — Ambulatory Visit (INDEPENDENT_AMBULATORY_CARE_PROVIDER_SITE_OTHER): Payer: Medicare Other | Admitting: Pulmonary Disease

## 2018-08-18 ENCOUNTER — Encounter: Payer: Self-pay | Admitting: Pulmonary Disease

## 2018-08-18 VITALS — BP 122/80 | HR 100 | Ht 70.0 in | Wt 388.0 lb

## 2018-08-18 DIAGNOSIS — J439 Emphysema, unspecified: Secondary | ICD-10-CM | POA: Diagnosis not present

## 2018-08-18 DIAGNOSIS — J449 Chronic obstructive pulmonary disease, unspecified: Secondary | ICD-10-CM

## 2018-08-18 DIAGNOSIS — J984 Other disorders of lung: Secondary | ICD-10-CM

## 2018-08-18 DIAGNOSIS — R911 Solitary pulmonary nodule: Secondary | ICD-10-CM | POA: Diagnosis not present

## 2018-08-18 DIAGNOSIS — G4733 Obstructive sleep apnea (adult) (pediatric): Secondary | ICD-10-CM

## 2018-08-18 DIAGNOSIS — Z720 Tobacco use: Secondary | ICD-10-CM | POA: Diagnosis not present

## 2018-08-18 DIAGNOSIS — F17210 Nicotine dependence, cigarettes, uncomplicated: Secondary | ICD-10-CM

## 2018-08-18 LAB — PULMONARY FUNCTION TEST
DL/VA % pred: 88 %
DL/VA: 3.62 ml/min/mmHg/L
DLCO unc % pred: 49 %
DLCO unc: 12.3 ml/min/mmHg
FEF 25-75 Post: 1.06 L/sec
FEF 25-75 Pre: 0.83 L/sec
FEF2575-%Change-Post: 27 %
FEF2575-%Pred-Post: 39 %
FEF2575-%Pred-Pre: 30 %
FEV1-%Change-Post: 0 %
FEV1-%Pred-Post: 57 %
FEV1-%Pred-Pre: 56 %
FEV1-Post: 1.58 L
FEV1-Pre: 1.57 L
FEV1FVC-%Change-Post: 1 %
FEV1FVC-%Pred-Pre: 84 %
FEV6-%Change-Post: -5 %
FEV6-%Pred-Post: 64 %
FEV6-%Pred-Pre: 68 %
FEV6-Post: 2.19 L
FEV6-Pre: 2.32 L
FEV6FVC-%Change-Post: 0 %
FEV6FVC-%Pred-Post: 102 %
FEV6FVC-%Pred-Pre: 102 %
FVC-%Change-Post: -1 %
FVC-%Pred-Post: 65 %
FVC-%Pred-Pre: 66 %
FVC-Post: 2.29 L
FVC-Pre: 2.32 L
Post FEV1/FVC ratio: 69 %
Post FEV6/FVC ratio: 100 %
Pre FEV1/FVC ratio: 68 %
Pre FEV6/FVC Ratio: 100 %
RV % pred: 51 %
RV: 1.12 L
TLC % pred: 57 %
TLC: 3.41 L

## 2018-08-18 MED ORDER — TRELEGY ELLIPTA 100-62.5-25 MCG/INH IN AEPB: 1.0000 | INHALATION_SPRAY | Freq: Every day | RESPIRATORY_TRACT | 0 refills | Status: DC

## 2018-08-18 NOTE — Progress Notes (Signed)
Synopsis: Referred in November 2019 for COPD by [name], MD  Subjective:   PATIENT ID: [name] GENDER: female DOB: Mar 25, 1965, MRN: [id number]  Chief Complaint  Patient presents with  . Follow-up    PFT done today. She reports using Trelegy daily and Albuterol prn. She reports she needs a letter. stating she does not have COVID-19. She also reports she has started back smoking.     PMH of OSA, OHS, on BiPAP, Chronic respiratory failure on 2L day and night. Long time smoker past 40 years 1.5ppd. Recent CT scan with RUL and LUL lung nodule. She has trouble walking and talking at the same time. Currently just managed with albuterol inhaler. Also on trelegy inhaler. Planning to go on the 2nd for evaluation of lap-band surgery. Its a 6 month program that she is enrolling in. She does have allergies to seasons, outdoor and pollen.  Overall the patient has had significant shortness of breath with exertion or minimal activity.  She does understand that her weight plays a large role in this.  She has been trying to watch what she is eating.  She has been successful at quitting smoking within the past year.  She has a sleep study scheduled for evaluation of her OSA and getting a new machine and new mask.  She has daily cough, denies sputum production, denies recent exacerbation.  She has been told that she has COPD but has not had full PFTs before.  She does have chronic diastolic heart failure that is managed with diuretics.  She watches her fluid intake.  She has noticed some edema in her lower extremities.  OV 02/13/2018: She has been doing really well. She started a new fluid pill and feels like it has been better results. She has lost several pounds proximately 20 since she was last seen with [place].  She also obtained a new BiPAP machine for treatment of her obstructive sleep apnea.  She really likes the new machine and has been sleeping so much better.  She has been using a new humidifier  which has made it tolerable.  She has been checking her oxygen on a regular basis.  Most of the time with her new finger O2 sat probe she is in the mid 90s.  And has not needed oxygen at rest and even with exertion in most places.  She does keep it with her just in case.  She is still using oxygen at nighttime with her Pap therapy.  At this point her respiratory complaints are approximately the same.  She does have a follow-up CT scan for March 2020 for bilateral lung nodules and PFTs that have been ordered but not yet completed.  Office spirometry last time revealed reduced spirometry no evidence of obstruction.  OV 08/18/2018: Seen to day in follow up and having PFTS completed today in the offices. She recently describes and episode of getting a steroid injection her had for trigger finger. She was stressed out because having her glucose elevated. She unfortunately started smoking again. She has started her chantix again. She is going to stop again. She stopped for 8 months before restarted.  Pulmonary function test completed today which revealed an FVC of 2.29 L, 65% predicted, FEV1, 1.58 L, 57% predicted, ERV 46%, DLCO 49% predicted.  Patient has a BMI of 55.67.  She states that she cannot go out during the heat of the day.  She tries to avoid leaving her home between noon and 6 PM.  Past Medical History:  Diagnosis Date  . Anxiety   . Arthritis 04-10-11   hips, shoulders, back  . Asthma   . Bipolar disorder ([place])   . Cancer ([place]) 1993   cervical, no treatment done, went away per pt  . Cervical dysplasia or atypia 04-10-11   '93- once dx.-got pregnant-no intervention, then postpartum, no dysplasia found  . CHF (congestive heart failure) ([place])    no cardiologist 2014 dx, none now  . Chronic kidney disease    stage 3 kidney disease  . 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 Pathology showed Condyloma, gluteal CONDYLOMA ACUMINATUM.   [name] Kitchen COPD (chronic obstructive pulmonary  disease) ([place])   . Diabetes mellitus without complication ([place])   . Dyspnea    with actity, sitting  . Fall 12/16/2016  . Fibrocystic breast disease   . GERD (gastroesophageal reflux disease)   . Hepatitis    hep B-count is low at present,doesn't register  . Hypercalcemia   . Hyperlipidemia   . Hypertension   . Lung nodule 11/2017   right and left lung  . Migraine headache    none recent  . Morbid obesity ([place]) 03/27/2006  . Neuropathy   . Pneumonia    walking pneu 15 yrs. ago  . Skin lesion 03/15/2011   In gluteal crease now s/p removal by Dr. [name] of General Surgery on 3/12. Path shows condyloma.     . Sleep apnea 04-10-11   uses cpap, pt does not know settings  . Sleep apnea   . Trigger finger    left third     Family History  Problem Relation Age of Onset  . Pulmonary embolism Mother   . Stroke Father   . Alcohol abuse Father   . Pneumonia Father   . Cancer Maternal Aunt        breast  . Breast cancer Maternal Aunt   . Asthma Other   . COPD Other   . Hypertension Other   . Diabetes Other   . Asthma Sister   . Depression Sister   . Arthritis Sister   . Sickle cell anemia Son      Past Surgical History:  Procedure Laterality Date  . ANAL FISTULECTOMY  04/17/2011   Procedure: FISTULECTOMY ANAL;  Surgeon: Imogene Burn. [name], MD;  Location: [facility];  Service: General;  Laterality: N/A;  Excision of Condyloma Gluteal Cleft   . BACK SURGERY  04-10-11   x5-Lumbar fusion-retained hardware.(Dr. Louanne Skye)  . BREAST CYST EXCISION     bilateral breast, 3 cysts removed from each breast  . BREAST EXCISIONAL BIOPSY Right    x 3  . BREAST EXCISIONAL BIOPSY Left    x 3  . CARPAL TUNNEL RELEASE Bilateral   . Cervical biospy    . COLONOSCOPY WITH PROPOFOL N/A 07/06/2015   Procedure: COLONOSCOPY WITH PROPOFOL;  Surgeon: [name], MD;  Location: [facility];  Service: Endoscopy;  Laterality: N/A;  . DOPPLER ECHOCARDIOGRAPHY  04/06/2012   AT [facility] 55-60%  . FRACTURE  SURGERY    . I&D EXTREMITY Left 12/18/2016   Procedure: IRRIGATION AND DEBRIDEMENT LEFT FOOT, CLOSURE;  Surgeon: [name], MD;  Location: [facility];  Service: Orthopedics;  Laterality: Left;  [name] Kitchen MULTIPLE TOOTH EXTRACTIONS    . NECK SURGERY  04-10-11   x3- cervical fusion with plating and screws-Dr. [name]  . TEE WITHOUT CARDIOVERSION N/A 04/06/2012   Procedure: TRANSESOPHAGEAL ECHOCARDIOGRAM (TEE);  Surgeon: Nadean Corwin.  Hilty, MD;  Location: [facility];  Service: Cardiovascular;  Laterality: N/A;  . TRIGGER FINGER RELEASE Right    middle finger  . TRIGGER FINGER RELEASE Left 06/28/2016   Procedure: RELEASE TRIGGER FINGER LEFT 3RD FINGER;  Surgeon: [name], MD;  Location: [facility];  Service: Orthopedics;  Laterality: Left;  . TRIGGER FINGER RELEASE Right 06/12/2016   Procedure: RIGHT INDEX FINGER TRIGGER RELEASE;  Surgeon: [name], MD;  Location: [facility];  Service: Orthopedics;  Laterality: Right;  . TRIGGER FINGER RELEASE Right 01/19/2018   Procedure: RIGHT RING FINGER TRIGGER FINGER RELEASE;  Surgeon: [name], MD;  Location: [facility];  Service: Orthopedics;  Laterality: Right;    Social History   Socioeconomic History  . Marital status: Single    Spouse name: Not on file  . Number of children: 1  . Years of education: 40  . Highest education level: 12th grade  Occupational History    Comment: Disabled  Social Needs  . Financial resource strain: Not hard at all  . Food insecurity    Worry: Never true    Inability: Never true  . Transportation needs    Medical: No    Non-medical: No  Tobacco Use  . Smoking status: Current Some Day Smoker    Packs/day: 0.25    Years: 40.00    Pack years: 10.00    Types: Cigarettes    Start date: 01/28/1977    Last attempt to quit: 09/07/2017    Years since quitting: 0.9  . Smokeless tobacco: Never Used  . Tobacco comment: Quit prior to hospitalization using low dose Chantix  Substance and Sexual Activity  . Alcohol use: No     Alcohol/week: 0.0 standard drinks  . Drug use: Not Currently    Types: Marijuana, "Crack" cocaine    Comment: hx marijuana usemany years ago, Crack -none since 11/2015  . Sexual activity: Not Currently    Partners: Male    Birth control/protection: Post-menopausal  Lifestyle  . Physical activity    Days per week: 0 days    Minutes per session: 0 min  . Stress: Not at all  Relationships  . Social connections    Talks on phone: More than three times a week    Gets together: Twice a week    Attends religious service: 1 to 4 times per year    Active member of club or organization: No    Attends meetings of clubs or organizations: Never    Relationship status: Divorced  . Intimate partner violence    Fear of current or ex partner: No    Emotionally abused: No    Physically abused: No    Forced sexual activity: No  Other Topics Concern  . Not on file  Social History Narrative   Current Social History       Who lives at home: Patient lives alone in one level home; has 4 steps onto porch with a handrail. Has smoke alarms, no throw rugs. Has elevated toilet. Has shower bench in tub with grab rails.    Transportation: Patient has own vehicle and drives herself    Important Relationships "Family and friends"    Pets: None    Likes to eat varied diet. Seafood, fish, roasted [place] breast, vegetables and salads and fruits.   Education / Work:  12 th grade/ None    Interests / Fun: "Play bingo on phone, watch TV, Be with family and friends, sit on my porch."  Current Stressors: None    Religious / Personal Beliefs: "God Jesus"    Other: "I love everyone, I wake up with joy in my heart and go to sleep the same way. I'm a lovable person."                                                                                                     Allergies  Allergen Reactions  . Chantix [Varenicline Tartrate] Other (See Comments)    On the 1 mg dose, patient experienced hallucintation  .  Corticosteroids Other (See Comments)    HYPERGLYCEMIA  . 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 Aspirin Other (See Comments)    Kidney disease  . Levofloxacin Itching     Outpatient Medications Prior to Visit  Medication Sig Dispense Refill  . ACCU-CHEK SOFTCLIX LANCETS lancets TEST 4 TIMES DAILY 200 each 6  . Acetaminophen (TYLENOL EXTRA STRENGTH PO) Take 1,000 mg by mouth 2 (two) times a day.    [name] Kitchen ammonium lactate (AMLACTIN) 12 % lotion Apply 1 application topically 2 (two) times daily as needed for dry skin. 400 g 0  . atorvastatin (LIPITOR) 40 MG tablet Take 1 tablet (40 mg total) by mouth daily. 90 tablet 3  . baclofen (LIORESAL) 10 MG tablet Take 1 tablet (10 mg total) by mouth 3 (three) times daily as needed for muscle spasms. 90 each 0  . blood glucose meter kit and supplies KIT Dispense based on patient and insurance preference. Use up to four times daily as directed. (FOR ICD-9 250.00, 250.01). 1 each 0  . busPIRone (BUSPAR) 10 MG tablet TAKE 1 TABLET BY MOUTH THREE TIMES A DAY 270 tablet 4  . Capsaicin-Menthol (PAIN RELIEF EX) Apply 1 application topically 3 (three) times daily as needed (shoulder pain).    [name] Kitchen diclofenac sodium (VOLTAREN) 1 % GEL APPLY 2 GRAMS TO AFFECTED AREA 4 TIMES A DAY 300 g 2  . diltiazem (CARDIZEM CD) 120 MG 24 hr capsule Take 1 capsule (120 mg total) by mouth 2 (two) times daily. 180 capsule 3  . docusate sodium (COLACE) 100 MG capsule Take 200 mg by mouth 2 (two) times daily.    . empagliflozin (JARDIANCE) 25 MG TABS tablet Take 25 mg by mouth daily. 2250 mg 2  . febuxostat (ULORIC) 40 MG tablet Take 40 mg by mouth daily.    . fluticasone (FLONASE) 50 MCG/ACT nasal spray Place 2 sprays into both nostrils 2 (two) times daily. 16 g 6  . Fluticasone-Umeclidin-Vilant (TRELEGY ELLIPTA) 100-62.5-25 MCG/INH AEPB Inhale 1 puff into the lungs daily for 30 doses. 3 each 1  . furosemide (LASIX) 40 MG tablet Take 2 tablets (80 mg total) by  mouth 2 (two) times daily. 180 tablet 3  . gabapentin (NEURONTIN) 300 MG capsule TAKE 2 CAPSULES (600 MG TOTAL) BY MOUTH 3 (THREE) TIMES DAILY. 540 capsule 2  . glucose blood (ACCU-CHEK AVIVA PLUS) test strip TEST 4 TIMES DAILY 100 each 12  . HYDROcodone-acetaminophen (NORCO) 7.5-325 MG tablet Take 1 tablet by mouth  every 6 (six) hours as needed for moderate pain. 20 tablet 0  . hydrOXYzine (ATARAX/VISTARIL) 10 MG tablet TAKE 1 TABLET BY MOUTH EVERY DAY AS NEEDED 90 tablet 1  . hydrOXYzine (VISTARIL) 100 MG capsule Take 1 capsule (100 mg total) by mouth 3 (three) times daily as needed for itching. 30 capsule 0  . Insulin Glargine (LANTUS SOLOSTAR) 100 UNIT/ML Solostar Pen Inject 22 Units into the skin daily before breakfast. 15 mL   . insulin lispro (HUMALOG KWIKPEN) 100 UNIT/ML KwikPen Inject 0.06 mLs (6 Units total) into the skin 3 (three) times daily before meals. 15 mL   . Insulin Pen Needle (B-D UF III MINI PEN NEEDLES) 31G X 5 MM MISC CHECK SUGARS 4 TIMES A DAY BEFORE MEALS AND AT BEDTIME. 100 each 12  . ketoconazole (NIZORAL) 2 % cream APPLY 1 FINGERTIP AMOUNT TO EACH FOOT DAILY. 30 g 0  . loratadine (CLARITIN) 10 MG tablet Take 1 tablet (10 mg total) by mouth daily. 90 tablet 0  . losartan (COZAAR) 100 MG tablet Take 1 tablet (100 mg total) by mouth daily. 90 tablet 3  . metolazone (ZAROXOLYN) 2.5 MG tablet Take 1 tablet (2.5 mg total) by mouth 2 (two) times a week. TAKE 1 TABLET TWICE WEEKLY ON MONDAYS AND THURSDAYS ONLY 15 tablet 6  . montelukast (SINGULAIR) 10 MG tablet TAKE 1 TABLET AT BEDTIME 90 tablet 2  . Multiple Vitamins-Minerals (MULTIVITAMIN WITH MINERALS) tablet Take 1 tablet by mouth daily.    . mupirocin ointment (BACTROBAN) 2 % APPLY TO AFFECTED AREA TWICE A DAY 22 g 0  . NARCAN 4 MG/0.1ML LIQD nasal spray kit     . NON FORMULARY Uses a C-PAP at bedtime    . nystatin (MYCOSTATIN) 100000 UNIT/ML suspension Take 5 mLs (500,000 Units total) by mouth 4 (four) times daily. Swish  and swallow. (Patient taking differently: Take 5 mLs by mouth daily as needed (mouth sores). Swish and swallow.) 60 mL 3  . Omega-3 Fatty Acids (FISH OIL) 1000 MG CAPS Take 1,000 capsules by mouth once.    [name] Kitchen omeprazole (PRILOSEC) 20 MG capsule Take 1 capsule (20 mg total) by mouth daily as needed. 90 capsule 3  . oxybutynin (DITROPAN XL) 10 MG 24 hr tablet Take 1 tablet (10 mg total) by mouth at bedtime. 30 tablet 0  . potassium chloride SA (KLOR-CON M20) 20 MEQ tablet Take 1 tablet (20 mEq total) by mouth 2 (two) times a week. Take 1 tablet twice weekly on Monday and Thursday. 15 tablet 6  . PROAIR HFA 108 (90 Base) MCG/ACT inhaler INHALE 2 PUFFS INTO THE LUNGS EVERY 6 (SIX) HOURS AS NEEDED FOR SHORTNESS OF BREATH. 8.5 Inhaler 1  . promethazine (PHENERGAN) 25 MG tablet Take 1 tablet (25 mg total) by mouth every 6 (six) hours as needed for nausea. 30 tablet 1  . sertraline (ZOLOFT) 25 MG tablet TAKE 1 TABLET BY MOUTH EVERY DAY 90 tablet 1  . varenicline (CHANTIX) 0.5 MG tablet Take 1 tablet (0.5 mg total) by mouth 2 (two) times daily. 60 tablet 1  . Vitamin D, Ergocalciferol, (DRISDOL) 50000 units CAPS capsule Take 50,000 Units by mouth daily. For 30 days    . Fluticasone-Umeclidin-Vilant (TRELEGY ELLIPTA) 100-62.5-25 MCG/INH AEPB Inhale 1 puff into the lungs daily. (Patient not taking: Reported on 08/18/2018) 1 each 3   Facility-Administered Medications Prior to Visit  Medication Dose Route Frequency Provider Last Rate Last Dose  . technetium tetrofosmin (TC-MYOVIEW) injection 31.5 millicurie  31.2  millicurie Intravenous Once PRN Sueanne Margarita, MD        Review of Systems  Constitutional: Negative for chills, fever, malaise/fatigue and weight loss.  HENT: Negative for hearing loss, sore throat and tinnitus.   Eyes: Negative for blurred vision and double vision.  Respiratory: Positive for shortness of breath. Negative for cough, hemoptysis, sputum production, wheezing and stridor.    Cardiovascular: Negative for chest pain, palpitations, orthopnea, leg swelling and PND.  Gastrointestinal: Negative for abdominal pain, constipation, diarrhea, heartburn, nausea and vomiting.  Genitourinary: Negative for dysuria, hematuria and urgency.  Musculoskeletal: Negative for joint pain and myalgias.  Skin: Negative for itching and rash.  Neurological: Negative for dizziness, tingling, weakness and headaches.  Endo/Heme/Allergies: Negative for environmental allergies. Does not bruise/bleed easily.  Psychiatric/Behavioral: Negative for depression. The patient is not nervous/anxious and does not have insomnia.   All other systems reviewed and are negative.   Objective:  Physical Exam Vitals signs reviewed.  Constitutional:      General: She is not in acute distress.    Appearance: She is well-developed. She is obese.  HENT:     Head: Normocephalic and atraumatic.  Eyes:     General: No scleral icterus.    Conjunctiva/sclera: Conjunctivae normal.     Pupils: Pupils are equal, round, and reactive to light.  Neck:     Musculoskeletal: Neck supple.     Vascular: No JVD.     Trachea: No tracheal deviation.  Cardiovascular:     Rate and Rhythm: Normal rate and regular rhythm.     Heart sounds: Normal heart sounds. No murmur.  Pulmonary:     Effort: Pulmonary effort is normal. No tachypnea, accessory muscle usage or respiratory distress.     Breath sounds: Normal breath sounds. No stridor. No wheezing, rhonchi or rales.  Abdominal:     General: Bowel sounds are normal. There is no distension.     Palpations: Abdomen is soft.     Tenderness: There is no abdominal tenderness.     Comments: Obese pannus   Musculoskeletal:        General: No tenderness.     Right lower leg: Edema present.     Left lower leg: Edema present.  Lymphadenopathy:     Cervical: No cervical adenopathy.  Skin:    General: Skin is warm and dry.     Capillary Refill: Capillary refill takes less than 2  seconds.     Findings: No rash.     Comments: Surgical scar on upper back and neck   Neurological:     Mental Status: She is alert and oriented to person, place, and time.  Psychiatric:        Behavior: Behavior normal.      Vitals:   08/18/18 1618  BP: 122/80  Pulse: 100  SpO2: 94%  Weight: (!) 388 lb (176 kg)  Height: '5\' 10"'  (1.778 m)   94% on RA BMI Readings from Last 3 Encounters:  08/18/18 55.67 kg/m  07/24/18 56.02 kg/m  07/21/18 55.96 kg/m   Wt Readings from Last 3 Encounters:  08/18/18 (!) 388 lb (176 kg)  07/24/18 (!) 390 lb 6.4 oz (177.1 kg)  07/21/18 (!) 390 lb (176.9 kg)     CBC    Component Value Date/Time   WBC 9.2 01/27/2018 0535   RBC 5.69 (H) 01/27/2018 0535   HGB 14.6 01/27/2018 0535   HGB 15.9 04/23/2017 1010   HCT 44.4 01/27/2018 0535   HCT 46.2  04/23/2017 1010   PLT 344 01/27/2018 0535   PLT 360 04/23/2017 1010   MCV 78.0 (L) 01/27/2018 0535   MCV 82 04/23/2017 1010   MCH 25.7 (L) 01/27/2018 0535   MCHC 32.9 01/27/2018 0535   RDW 16.8 (H) 01/27/2018 0535   RDW 13.9 04/23/2017 1010   LYMPHSABS 2.2 01/27/2018 0535   MONOABS 1.1 (H) 01/27/2018 0535   EOSABS 0.2 01/27/2018 0535   BASOSABS 0.0 01/27/2018 0535    Chest Imaging: September 2019 CT chest: Right upper lobe 6 mm lung nodule, irregular, 4 mm irregular dense nodule left upper lobe The patient's images have been independently reviewed by me.    08/07/2018: IMPRESSION: 12 mm sub-solid nodule in right upper lobe is stable since 04/21/2018 exam, although it does show a slight increase in size compared to 2019 exam. This remains suspicious for low-grade adenocarcinoma. Recommend continued follow-up by chest CT in 1-2 years. This recommendation follows the consensus statement: Guidelines for Management of Small Pulmonary Nodules Detected on CT Images: From the Fleischner Society 2017; Radiology 2017; 284:228-243.  The patient's images have been independently reviewed by me.  Reviewed these images today with the patient.    Pulmonary Functions Testing Results: PFT Results Latest Ref Rng & Units 08/18/2018  FVC-Pre L 2.32  FVC-Predicted Pre % 66  FVC-Post L 2.29  FVC-Predicted Post % 65  Pre FEV1/FVC % % 68  Post FEV1/FCV % % 69  FEV1-Pre L 1.57  FEV1-Predicted Pre % 56  FEV1-Post L 1.58  DLCO UNC% % 49  DLCO COR %Predicted % 88  TLC L 3.41  TLC % Predicted % 57  RV % Predicted % 51    FeNO: None   Pathology: None  Echocardiogram:  Study Conclusions - Left ventricle: The cavity size was normal. Wall thickness was   normal. Systolic function was normal. The estimated ejection   fraction was in the range of 55% to 60%. Wall motion was normal;   there were no regional wall motion abnormalities. Doppler   parameters are consistent with abnormal left ventricular   relaxation (grade 1 diastolic dysfunction).  Heart Catheterization: None      Assessment & Plan:      ICD-10-CM   1. 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 Morbid obesity due to excess calories ([facility])  E66.01   4. Nodule of upper lobe of right lung  R91.1   5. Tobacco abuse  Z72.0   6. Cigarette smoker  F17.210    Discussion:  This is a 53 year old morbidly obese, has OSA on BiPAP, likely has OHS. She has moderate COPD as diagnosed by PFTs completed today.  She likely has mixed restrictive and obstructive defect due to her morbid obesity.  Unfortunately she is still smoking.  Today in the office we discussed smoking cessation counseling again.  She is going to restart her Chantix.  She really wants to try to quit again.  She did well for 8 months before going back after a stressful event related to managing her diabetes and getting a steroid injection.  At this point she needs to continue use of her Trelegy inhaler. Continue use of albuterol for shortness of breath and wheezing.  She needs to restart her Chantix. She can also continue tapering her cigarette use and hopefully able to quit soon. As for the lung nodule in the right upper  lobe she will need a repeat CT image in 6 months.  If it is continuing to grow we need to discuss bronchoscopy versus interventional radiology for needle biopsy.  It is within the upper lobe and adjacent to the pleura puts him at moderate risk for development of pneumothorax and may be better reached by IR.  However we will discussed risk benefits and alternatives of proceeding with a bronchoscopy versus referral.  Patient to return to clinic in 6 months following CT images to be completed at that time.  We will get her an appointment to establish care with sleep physician here in the clinic.  Greater than 50% of this patient 25-minute office visit was spent face-to-face discussing above recommendations and treatment plan as well as counseling on smoking cessation.  We also reviewed the patient's images in person today.    Current Outpatient Medications:  .  ACCU-CHEK SOFTCLIX LANCETS lancets, TEST 4 TIMES DAILY, Disp: 200 each, Rfl: 6 .  Acetaminophen (TYLENOL EXTRA STRENGTH PO), Take 1,000 mg by mouth 2 (two) times a day., Disp: , Rfl:  .  ammonium lactate (AMLACTIN) 12 % lotion, Apply 1 application topically 2 (two) times daily as needed for dry skin., Disp: 400 g, Rfl: 0 .  atorvastatin (LIPITOR) 40 MG tablet, Take 1 tablet (40 mg total) by mouth daily., Disp: 90 tablet, Rfl: 3 .  baclofen (LIORESAL) 10 MG tablet, Take 1 tablet (10 mg total) by mouth 3 (three) times daily as needed for muscle spasms., Disp: 90 each, Rfl: 0 .  blood glucose meter kit and supplies KIT, Dispense based on patient and insurance preference. Use up to four times daily as directed. (FOR ICD-9 250.00, 250.01)., Disp: 1 each, Rfl: 0 .  busPIRone (BUSPAR) 10 MG tablet, TAKE 1 TABLET BY MOUTH THREE TIMES A DAY, Disp: 270 tablet, Rfl: 4 .  Capsaicin-Menthol (PAIN  RELIEF EX), Apply 1 application topically 3 (three) times daily as needed (shoulder pain)., Disp: , Rfl:  .  diclofenac sodium (VOLTAREN) 1 % GEL, APPLY 2 GRAMS TO AFFECTED AREA 4 TIMES A DAY, Disp: 300 g, Rfl: 2 .  diltiazem (CARDIZEM CD) 120 MG 24 hr capsule, Take 1 capsule (120 mg total) by mouth 2 (two) times daily., Disp: 180 capsule, Rfl: 3 .  docusate sodium (COLACE) 100 MG capsule, Take 200 mg by mouth 2 (two) times daily., Disp: , Rfl:  .  empagliflozin (JARDIANCE) 25 MG TABS tablet, Take 25 mg by mouth daily., Disp: 2250 mg, Rfl: 2 .  febuxostat (ULORIC) 40 MG tablet, Take 40 mg by mouth daily., Disp: , Rfl:  .  fluticasone (FLONASE) 50 MCG/ACT nasal spray, Place 2 sprays into both nostrils 2 (two) times daily., Disp: 16 g, Rfl: 6 .  Fluticasone-Umeclidin-Vilant (TRELEGY ELLIPTA) 100-62.5-25 MCG/INH AEPB, Inhale 1 puff into the lungs daily for 30 doses., Disp: 3 each, Rfl: 1 .  furosemide (LASIX) 40 MG tablet, Take 2 tablets (80 mg total) by mouth 2 (two) times daily., Disp: 180 tablet, Rfl: 3 .  gabapentin (NEURONTIN) 300 MG capsule, TAKE 2 CAPSULES (600 MG TOTAL) BY MOUTH 3 (THREE) TIMES DAILY., Disp: 540 capsule, Rfl: 2 .  glucose blood (ACCU-CHEK AVIVA PLUS) test strip, TEST 4 TIMES DAILY, Disp: 100 each, Rfl: 12 .  HYDROcodone-acetaminophen (NORCO) 7.5-325 MG tablet, Take 1 tablet by mouth every 6 (six) hours as needed for moderate pain., Disp: 20 tablet, Rfl: 0 .  hydrOXYzine (ATARAX/VISTARIL) 10 MG tablet, TAKE 1 TABLET BY MOUTH EVERY DAY AS  NEEDED, Disp: 90 tablet, Rfl: 1 .  hydrOXYzine (VISTARIL) 100 MG capsule, Take 1 capsule (100 mg total) by mouth 3 (three) times daily as needed for itching., Disp: 30 capsule, Rfl: 0 .  Insulin Glargine (LANTUS SOLOSTAR) 100 UNIT/ML Solostar Pen, Inject 22 Units into the skin daily before breakfast., Disp: 15 mL, Rfl:  .  insulin lispro (HUMALOG KWIKPEN) 100 UNIT/ML KwikPen, Inject 0.06 mLs (6 Units total) into the skin 3 (three) times daily  before meals., Disp: 15 mL, Rfl:  .  Insulin Pen Needle (B-D UF III MINI PEN NEEDLES) 31G X 5 MM MISC, CHECK SUGARS 4 TIMES A DAY BEFORE MEALS AND AT BEDTIME., Disp: 100 each, Rfl: 12 .  ketoconazole (NIZORAL) 2 % cream, APPLY 1 FINGERTIP AMOUNT TO EACH FOOT DAILY., Disp: 30 g, Rfl: 0 .  loratadine (CLARITIN) 10 MG tablet, Take 1 tablet (10 mg total) by mouth daily., Disp: 90 tablet, Rfl: 0 .  losartan (COZAAR) 100 MG tablet, Take 1 tablet (100 mg total) by mouth daily., Disp: 90 tablet, Rfl: 3 .  metolazone (ZAROXOLYN) 2.5 MG tablet, Take 1 tablet (2.5 mg total) by mouth 2 (two) times a week. TAKE 1 TABLET TWICE WEEKLY ON MONDAYS AND THURSDAYS ONLY, Disp: 15 tablet, Rfl: 6 .  montelukast (SINGULAIR) 10 MG tablet, TAKE 1 TABLET AT BEDTIME, Disp: 90 tablet, Rfl: 2 .  Multiple Vitamins-Minerals (MULTIVITAMIN WITH MINERALS) tablet, Take 1 tablet by mouth daily., Disp: , Rfl:  .  mupirocin ointment (BACTROBAN) 2 %, APPLY TO AFFECTED AREA TWICE A DAY, Disp: 22 g, Rfl: 0 .  NARCAN 4 MG/0.1ML LIQD nasal spray kit, , Disp: , Rfl:  .  NON FORMULARY, Uses a C-PAP at bedtime, Disp: , Rfl:  .  nystatin (MYCOSTATIN) 100000 UNIT/ML suspension, Take 5 mLs (500,000 Units total) by mouth 4 (four) times daily. Swish and swallow. (Patient taking differently: Take 5 mLs by mouth daily as needed (mouth sores). Swish and swallow.), Disp: 60 mL, Rfl: 3 .  Omega-3 Fatty Acids (FISH OIL) 1000 MG CAPS, Take 1,000 capsules by mouth once., Disp: , Rfl:  .  omeprazole (PRILOSEC) 20 MG capsule, Take 1 capsule (20 mg total) by mouth daily as needed., Disp: 90 capsule, Rfl: 3 .  oxybutynin (DITROPAN XL) 10 MG 24 hr tablet, Take 1 tablet (10 mg total) by mouth at bedtime., Disp: 30 tablet, Rfl: 0 .  potassium chloride SA (KLOR-CON M20) 20 MEQ tablet, Take 1 tablet (20 mEq total) by mouth 2 (two) times a week. Take 1 tablet twice weekly on Monday and Thursday., Disp: 15 tablet, Rfl: 6 .  PROAIR HFA 108 (90 Base) MCG/ACT inhaler,  INHALE 2 PUFFS INTO THE LUNGS EVERY 6 (SIX) HOURS AS NEEDED FOR SHORTNESS OF BREATH., Disp: 8.5 Inhaler, Rfl: 1 .  promethazine (PHENERGAN) 25 MG tablet, Take 1 tablet (25 mg total) by mouth every 6 (six) hours as needed for nausea., Disp: 30 tablet, Rfl: 1 .  sertraline (ZOLOFT) 25 MG tablet, TAKE 1 TABLET BY MOUTH EVERY DAY, Disp: 90 tablet, Rfl: 1 .  varenicline (CHANTIX) 0.5 MG tablet, Take 1 tablet (0.5 mg total) by mouth 2 (two) times daily., Disp: 60 tablet, Rfl: 1 .  Vitamin D, Ergocalciferol, (DRISDOL) 50000 units CAPS capsule, Take 50,000 Units by mouth daily. For 30 days, Disp: , Rfl:  No current facility-administered medications for this visit.   Facility-Administered Medications Ordered in Other Visits:  .  technetium tetrofosmin (TC-MYOVIEW) injection 39.7 millicurie, 67.3 millicurie, Intravenous, Once PRN,  Sueanne Margarita, MD   Garner Nash, DO [facility] 08/18/2018 4:47 PM

## 2018-08-18 NOTE — Patient Instructions (Signed)
Thank you for visiting Dr. Valeta Harms at [facility]. Today we recommend the following:  Orders Placed This Encounter  Procedures  . CT Super D Chest Wo Contrast   Return in about 6 months (around 02/18/2019), or if symptoms worsen or fail to improve, for Follow up in clinic following completion of the CT imaging of the chest. .    Please do your part to reduce the spread of COVID-19.

## 2018-08-18 NOTE — Progress Notes (Signed)
Full PFT performed today. °

## 2018-08-19 ENCOUNTER — Other Ambulatory Visit: Payer: Self-pay | Admitting: Surgery

## 2018-08-19 ENCOUNTER — Ambulatory Visit (INDEPENDENT_AMBULATORY_CARE_PROVIDER_SITE_OTHER): Payer: Medicare Other | Admitting: Podiatry

## 2018-08-19 ENCOUNTER — Encounter: Payer: Self-pay | Admitting: Podiatry

## 2018-08-19 ENCOUNTER — Other Ambulatory Visit: Payer: Self-pay

## 2018-08-19 VITALS — Temp 98.8°F

## 2018-08-19 DIAGNOSIS — M79674 Pain in right toe(s): Secondary | ICD-10-CM | POA: Diagnosis not present

## 2018-08-19 DIAGNOSIS — B351 Tinea unguium: Secondary | ICD-10-CM | POA: Diagnosis not present

## 2018-08-19 DIAGNOSIS — G4733 Obstructive sleep apnea (adult) (pediatric): Secondary | ICD-10-CM | POA: Diagnosis not present

## 2018-08-19 DIAGNOSIS — E1142 Type 2 diabetes mellitus with diabetic polyneuropathy: Secondary | ICD-10-CM | POA: Diagnosis not present

## 2018-08-19 DIAGNOSIS — M4807 Spinal stenosis, lumbosacral region: Secondary | ICD-10-CM

## 2018-08-19 DIAGNOSIS — M79675 Pain in left toe(s): Secondary | ICD-10-CM

## 2018-08-19 NOTE — Addendum Note (Signed)
Addended by: Daylene Posey T on: 08/19/2018 02:54 PM   Modules accepted: Orders

## 2018-08-19 NOTE — Progress Notes (Signed)
Complaint:  Visit Type: Patient returns to my office for continued preventative foot care services. Complaint: Patient states" my nails have grown long and thick and become painful to walk and wear shoes" Patient has been diagnosed with DM with angiopathy. The patient presents for preventative foot care services. No changes to ROS.   Podiatric Exam: Vascular: dorsalis pedis and posterior tibial pulses are palpable bilateral. Capillary return is immediate. Temperature gradient is WNL. Skin turgor WNL  Sensorium: Normal Semmes Weinstein monofilament test. Normal tactile sensation bilaterally. Nail Exam: Pt has thick disfigured discolored nails with subungual debris noted bilateral entire nail hallux through fifth toenails Ulcer Exam: There is no evidence of ulcer or pre-ulcerative changes or infection. Orthopedic Exam: Muscle tone and strength are WNL. No limitations in general ROM. No crepitus or effusions noted. Foot type and digits show no abnormalities. Bony prominences are unremarkable. Skin: No Porokeratosis. No infection or ulcers.  Thick hyperkeratotic skin right foot.  Diagnosis:  Onychomycosis, , Pain in right toe, pain in left toes  Treatment & Plan Procedures and Treatment: Consent by patient was obtained for treatment procedures.   Debridement of mycotic and hypertrophic toenails, 1 through 5 bilateral and clearing of subungual debris. No ulceration, no infection noted.  Return Visit-Office Procedure: Patient instructed to return to the office for a follow up visit 10 weeks  for continued evaluation and treatment.    Gardiner Barefoot DPM

## 2018-08-20 ENCOUNTER — Ambulatory Visit: Payer: Medicare Other | Admitting: Specialist

## 2018-08-20 ENCOUNTER — Telehealth: Payer: Self-pay | Admitting: Nurse Practitioner

## 2018-08-20 ENCOUNTER — Telehealth: Payer: Self-pay

## 2018-08-20 NOTE — Telephone Encounter (Signed)
-----   Message from Vivia Ewing, LPN sent at 05/06/9276  5:11 PM EDT ----- Call patient to schedule sleep consult with [facility]

## 2018-08-20 NOTE — Telephone Encounter (Signed)
Phone call to patient to verify medication list and allergies for myelogram procedure. Pt instructed to hold Buspar, Phenergan, and Zoloft for 48hrs prior to myelogram appointment time. Pt verbalized understanding. Pre and post procedure instructions reviewed with pt.

## 2018-08-20 NOTE — Telephone Encounter (Signed)
LMTCB. Please schedule patient a consult appt with AO.

## 2018-08-21 ENCOUNTER — Encounter: Payer: Self-pay | Admitting: Family Medicine

## 2018-08-21 DIAGNOSIS — Z720 Tobacco use: Secondary | ICD-10-CM | POA: Insufficient documentation

## 2018-08-22 ENCOUNTER — Other Ambulatory Visit (INDEPENDENT_AMBULATORY_CARE_PROVIDER_SITE_OTHER): Payer: Self-pay | Admitting: Specialist

## 2018-08-24 NOTE — Telephone Encounter (Signed)
LMTCB

## 2018-08-24 NOTE — Telephone Encounter (Signed)
Please advise 

## 2018-08-25 ENCOUNTER — Other Ambulatory Visit: Payer: Medicare Other

## 2018-08-25 NOTE — Telephone Encounter (Signed)
Call made to patient, she reports she is already using a cpap and she see's  Dr. Rexene Alberts P:[phone], and she uses [employer] for her cpap and supplies.   BI did you want to get established in our office or did you just want to make sure she was being followed by someone. Thanks.

## 2018-08-25 NOTE — Telephone Encounter (Signed)
PCCM: Ok great. I just wanted to make sure she had follow up Thanks [facility] 08/25/2018 3:49 PM

## 2018-08-25 NOTE — Telephone Encounter (Signed)
Noted.   Will close encounter.   Nothing further needed at this time.  

## 2018-08-27 DIAGNOSIS — R81 Glycosuria: Secondary | ICD-10-CM | POA: Diagnosis not present

## 2018-08-27 DIAGNOSIS — N319 Neuromuscular dysfunction of bladder, unspecified: Secondary | ICD-10-CM | POA: Diagnosis not present

## 2018-08-31 NOTE — Discharge Instructions (Signed)
Myelogram Discharge Instructions  1. Go home and rest quietly for the next 24 hours.  It is important to lie flat for the next 24 hours.  Get up only to go to the restroom.  You may lie in the bed or on a couch on your back, your stomach, your left side or your right side.  You may have one pillow under your head.  You may have pillows between your knees while you are on your side or under your knees while you are on your back.  2. DO NOT drive today.  Recline the seat as far back as it will go, while still wearing your seat belt, on the way home.  3. You may get up to go to the bathroom as needed.  You may sit up for 10 minutes to eat.  You may resume your normal diet and medications unless otherwise indicated.  Drink lots of extra fluids today and tomorrow.  4. The incidence of headache, nausea, or vomiting is about 5% (one in 20 patients).  If you develop a headache, lie flat and drink plenty of fluids until the headache goes away.  Caffeinated beverages may be helpful.  If you develop severe nausea and vomiting or a headache that does not go away with flat bed rest, call [phone].  5. You may resume normal activities after your 24 hours of bed rest is over; however, do not exert yourself strongly or do any heavy lifting tomorrow. If when you get up you have a headache when standing, go back to bed and force fluids for another 24 hours.  6. Call your physician for a follow-up appointment.  The results of your myelogram will be sent directly to your physician by the following day.  7. If you have any questions or if complications develop after you arrive home, please call [phone].  Discharge instructions have been explained to the patient.  The patient, or the person responsible for the patient, fully understands these instructions.  YOU MAY RESTART YOUR BUSPAR, PHENERGAN AND ZOLOFT TOMORROW 09/02/2018 AT 1:00PM

## 2018-09-01 ENCOUNTER — Ambulatory Visit
Admission: RE | Admit: 2018-09-01 | Discharge: 2018-09-01 | Disposition: A | Discharge status: Home / Self Care | Payer: Medicare Other | Source: Ambulatory Visit | Attending: Surgery | Admitting: Surgery

## 2018-09-01 DIAGNOSIS — Z760 Encounter for issue of repeat prescription: Secondary | ICD-10-CM | POA: Diagnosis not present

## 2018-09-01 DIAGNOSIS — M5136 Other intervertebral disc degeneration, lumbar region: Secondary | ICD-10-CM | POA: Diagnosis not present

## 2018-09-01 DIAGNOSIS — Z79899 Other long term (current) drug therapy: Secondary | ICD-10-CM | POA: Diagnosis not present

## 2018-09-01 DIAGNOSIS — M48061 Spinal stenosis, lumbar region without neurogenic claudication: Secondary | ICD-10-CM | POA: Diagnosis not present

## 2018-09-01 DIAGNOSIS — M4807 Spinal stenosis, lumbosacral region: Secondary | ICD-10-CM

## 2018-09-01 DIAGNOSIS — G894 Chronic pain syndrome: Secondary | ICD-10-CM | POA: Diagnosis not present

## 2018-09-01 MED ORDER — DIAZEPAM 5 MG PO TABS
10.0000 mg | ORAL_TABLET | Freq: Once | ORAL | Status: AC
  Administered 2018-09-01: 10 mg via ORAL

## 2018-09-01 MED ORDER — MEPERIDINE HCL 100 MG/ML IJ SOLN
50.0000 mg | Freq: Once | INTRAMUSCULAR | Status: AC
  Administered 2018-09-01: 50 mg via INTRAMUSCULAR

## 2018-09-01 MED ORDER — IOPAMIDOL (ISOVUE-M 200) INJECTION 41%
20.0000 mL | Freq: Once | INTRAMUSCULAR | Status: AC
  Administered 2018-09-01: 20 mL via INTRATHECAL

## 2018-09-01 MED ORDER — ONDANSETRON HCL 4 MG/2ML IJ SOLN
4.0000 mg | Freq: Once | INTRAMUSCULAR | Status: AC
  Administered 2018-09-01: 4 mg via INTRAMUSCULAR

## 2018-09-01 NOTE — Progress Notes (Signed)
Patient states she has been off Buspar, Phenergan and Zoloft for at least the past two days.

## 2018-09-04 DIAGNOSIS — M5090 Cervical disc disorder, unspecified, unspecified cervical region: Secondary | ICD-10-CM | POA: Diagnosis not present

## 2018-09-04 DIAGNOSIS — G4733 Obstructive sleep apnea (adult) (pediatric): Secondary | ICD-10-CM | POA: Diagnosis not present

## 2018-09-04 DIAGNOSIS — G8929 Other chronic pain: Secondary | ICD-10-CM | POA: Diagnosis not present

## 2018-09-04 DIAGNOSIS — J9621 Acute and chronic respiratory failure with hypoxia: Secondary | ICD-10-CM | POA: Diagnosis not present

## 2018-09-04 DIAGNOSIS — M48061 Spinal stenosis, lumbar region without neurogenic claudication: Secondary | ICD-10-CM | POA: Diagnosis not present

## 2018-09-06 DIAGNOSIS — G4733 Obstructive sleep apnea (adult) (pediatric): Secondary | ICD-10-CM | POA: Diagnosis not present

## 2018-09-06 DIAGNOSIS — J9621 Acute and chronic respiratory failure with hypoxia: Secondary | ICD-10-CM | POA: Diagnosis not present

## 2018-09-06 DIAGNOSIS — M5090 Cervical disc disorder, unspecified, unspecified cervical region: Secondary | ICD-10-CM | POA: Diagnosis not present

## 2018-09-07 ENCOUNTER — Telehealth: Payer: Self-pay | Admitting: Orthopedic Surgery

## 2018-09-07 ENCOUNTER — Other Ambulatory Visit: Payer: Self-pay | Admitting: *Deleted

## 2018-09-07 ENCOUNTER — Other Ambulatory Visit: Payer: Self-pay

## 2018-09-07 ENCOUNTER — Other Ambulatory Visit: Payer: Self-pay | Admitting: Student in an Organized Health Care Education/Training Program

## 2018-09-07 DIAGNOSIS — K146 Glossodynia: Secondary | ICD-10-CM

## 2018-09-07 NOTE — Patient Outreach (Signed)
Telephone outreach to follow up on pt self management of chronic diseases. [name] gave a very thorough report of her monitoring values today.   1) GLUCOSE VALUES RANGING 108-241 2) WEIGHT VARIANCE 378.2-394.2 Had an 8 pound wt gain which she recognized was due to eating smoked [place] for lunch and dinner. She did get more SOB on exertion and noted her abdominal girth was larger, it was more difficult to fasten her seat belt. She quit eating the [place] and she had her regularly scheduled metolazone today. She is not in any distress. 3) BLOOD PRESSURE readings are mostly in the 837'R systolicaly and 93'P diastollicaly.  She received her consent form and returned that. She has the MOST form and Advanced Directives and is planning to complete those with her son this week. Advised she needs to make copies and provide her doctor with a copy.  She had imagining done on her back this week. She has a neurological follow up this week to discuss.  Encouraged pt's excellent monitoring and life style management and also in recognizing that her diet was causing her wt gain.  We will talk again next month.  [name]. Mary Neither, MSN, [facility] Nurse Practitioner [facility] [phone]

## 2018-09-08 ENCOUNTER — Ambulatory Visit: Payer: Medicare Other

## 2018-09-08 ENCOUNTER — Encounter: Payer: Self-pay | Admitting: Family Medicine

## 2018-09-09 ENCOUNTER — Encounter: Payer: Self-pay | Admitting: Neurology

## 2018-09-09 ENCOUNTER — Other Ambulatory Visit: Payer: Self-pay | Admitting: Student in an Organized Health Care Education/Training Program

## 2018-09-09 ENCOUNTER — Other Ambulatory Visit: Payer: Self-pay | Admitting: Family Medicine

## 2018-09-10 DIAGNOSIS — E8770 Fluid overload, unspecified: Secondary | ICD-10-CM

## 2018-09-10 DIAGNOSIS — R600 Localized edema: Secondary | ICD-10-CM

## 2018-09-10 DIAGNOSIS — I5022 Chronic systolic (congestive) heart failure: Secondary | ICD-10-CM

## 2018-09-11 ENCOUNTER — Ambulatory Visit (INDEPENDENT_AMBULATORY_CARE_PROVIDER_SITE_OTHER): Payer: Medicare Other | Admitting: Specialist

## 2018-09-11 ENCOUNTER — Encounter: Payer: Self-pay | Admitting: Specialist

## 2018-09-11 VITALS — BP 130/68 | HR 86 | Ht 70.0 in | Wt 391.6 lb

## 2018-09-11 DIAGNOSIS — M4323 Fusion of spine, cervicothoracic region: Secondary | ICD-10-CM

## 2018-09-11 DIAGNOSIS — M542 Cervicalgia: Secondary | ICD-10-CM

## 2018-09-11 DIAGNOSIS — R29898 Other symptoms and signs involving the musculoskeletal system: Secondary | ICD-10-CM | POA: Diagnosis not present

## 2018-09-11 DIAGNOSIS — Z981 Arthrodesis status: Secondary | ICD-10-CM

## 2018-09-11 DIAGNOSIS — M48062 Spinal stenosis, lumbar region with neurogenic claudication: Secondary | ICD-10-CM

## 2018-09-11 MED ORDER — FUROSEMIDE 40 MG PO TABS: 80.0000 mg | ORAL_TABLET | Freq: Two times a day (BID) | ORAL | 3 refills | Status: DC

## 2018-09-11 NOTE — Patient Instructions (Addendum)
Avoid bending, stooping and avoid lifting weights greater than 10 lbs. Avoid prolong standing and walking. Avoid frequent bending and stooping  No lifting greater than 10 lbs. May use ice or moist heat for pain. Weight loss is of benefit. Handicap license is approved. Avoid overhead lifting and overhead use of the arms. Do not lift greater than 5 lbs. Adjust head rest in vehicle to prevent hyperextension if rear ended. Take extra precautions to avoid falling. Cervical MRI to assess stenosis above the cervicothoracic fusion.

## 2018-09-11 NOTE — Progress Notes (Addendum)
Office Visit Note   Patient: [name]           Date of Birth: July 14, 1965           MRN: [id number] Visit Date: 09/11/2018              Requested by: [name], MD [address],  [place] [address] PCP: [name], MD   Assessment & Plan: Visit Diagnoses:  1. Upper extremity weakness   2. Cervicalgia   3. Spinal stenosis of lumbar region with neurogenic claudication   4. History of fusion of lumbar spine   5. Fusion of spine of cervicothoracic region     Plan: Avoid prolong standing and walking. Avoid frequent bending and stooping  No lifting greater than 10 lbs. May use ice or moist heat for pain. Weight loss is of benefit. Handicap license is approved. Avoid overhead lifting and overhead use of the arms. Do not lift greater than 5 lbs. Adjust head rest in vehicle to prevent hyperextension if rear ended. Take extra precautions to avoid falling. Cervical MRI to assess stenosis above the cervicothoracic fusion.  Follow-Up Instructions: Return in about 3 weeks (around 10/02/2018).   Orders:  No orders of the defined types were placed in this encounter.  No orders of the defined types were placed in this encounter.     Procedures: No procedures performed   Clinical Data: No additional findings.   Subjective: Chief Complaint  Patient presents with   Lower Back - Follow-up, Pain   Neck - Pain    53 year old female with history of low back pack and neck pain she has a lumbar fusion done in 2009. She has above the fusion adjacent level degenerative disc disease and spinal stenosis. She reports recent fluid retention and reports she is going to see her doctor, Dr. Caron Presume and her heart doctor, Dr. Harrell Gave. She has fluid tablets to help with recent weight gain. She is having pain that is 9-9.5 of 10. It affects her real bad. Pain with sitting and bending with right knee pain. She has been falling lately. She is post myelogram CT of  the neck and lumbar spine.  She is having falling spells and has to have EMS help get her up. Hands are weak and she has had previous cervicothoracic decompression and fusion.   Review of Systems  Constitutional: Negative.  Negative for activity change, appetite change, chills, diaphoresis, fatigue, fever and unexpected weight change.  HENT: Negative.  Negative for congestion, dental problem, drooling, ear discharge, ear pain, facial swelling, hearing loss, mouth sores, nosebleeds, postnasal drip, rhinorrhea, sinus pressure, sinus pain, sneezing, sore throat, tinnitus, trouble swallowing and voice change.   Eyes: Positive for visual disturbance. Negative for pain, discharge, redness and itching.  Respiratory: Positive for apnea, shortness of breath and wheezing.   Cardiovascular: Positive for chest pain, palpitations and leg swelling.  Gastrointestinal: Negative.  Negative for abdominal distention, abdominal pain, anal bleeding, blood in stool, constipation, diarrhea, nausea and rectal pain.  Endocrine: Negative for cold intolerance, heat intolerance, polydipsia, polyphagia and polyuria.  Genitourinary: Positive for difficulty urinating, dysuria and flank pain.  Musculoskeletal: Positive for arthralgias, back pain, gait problem, joint swelling, neck pain and neck stiffness. Negative for myalgias.  Skin: Negative for color change, pallor, rash and wound.  Allergic/Immunologic: Negative for environmental allergies, food allergies and immunocompromised state.  Neurological: Negative for dizziness, tremors, seizures, syncope, facial asymmetry, speech difficulty, weakness, light-headedness, numbness and headaches.  Hematological: Negative for adenopathy. Does not bruise/bleed easily.  Psychiatric/Behavioral: Negative for agitation, behavioral problems, confusion, decreased concentration, dysphoric mood, hallucinations, self-injury, sleep disturbance and suicidal ideas. The patient is not nervous/anxious  and is not hyperactive.      Objective: Vital Signs: BP 130/68 (BP Location: Left Arm, Patient Position: Sitting)    Pulse 86    Ht 5\' 10"  (1.778 m)    Wt (!) 391 lb 9.6 oz (177.6 kg)    LMP 04/10/2006    BMI 56.19 kg/m   Physical Exam Constitutional:      Appearance: She is well-developed.  HENT:     Head: Normocephalic and atraumatic.  Eyes:     Pupils: Pupils are equal, round, and reactive to light.  Neck:     Musculoskeletal: Normal range of motion and neck supple.  Pulmonary:     Effort: Pulmonary effort is normal.     Breath sounds: Normal breath sounds.  Abdominal:     General: Bowel sounds are normal.     Palpations: Abdomen is soft.  Skin:    General: Skin is warm and dry.  Neurological:     Mental Status: She is alert and oriented to person, place, and time.  Psychiatric:        Behavior: Behavior normal.        Thought Content: Thought content normal.        Judgment: Judgment normal.     Back Exam   Tenderness  The patient is experiencing tenderness in the cervical and lumbar.  Range of Motion  Extension: abnormal  Flexion: abnormal  Lateral bend right: abnormal  Lateral bend left: abnormal  Rotation right: abnormal  Rotation left: abnormal   Muscle Strength  Right Quadriceps:  4/5  Left Quadriceps:  4/5  Right Hamstrings:  4/5  Left Hamstrings:  4/5   Reflexes  Patellar: abnormal Achilles: abnormal Biceps: abnormal Babinski's sign: normal   Other  Toe walk: normal Heel walk: normal Sensation: normal Gait: antalgic  Erythema: no back redness      Specialty Comments:  No specialty comments available.  Imaging: No results found.   PMFS History: Patient Active Problem List   Diagnosis Date Noted   Tobacco abuse 08/21/2018   Right groin pain 07/20/2018   Depressed mood 07/13/2018   Mouth pain 06/02/2018   Paroxysmal SVT (supraventricular tachycardia) ([place]) 03/20/2018   Pure hypercholesterolemia 03/20/2018   Muscle  cramping 02/25/2018   Chest pressure 02/20/2018   Nodule of upper lobe of right lung 02/13/2018   Falls, subsequent encounter 01/30/2018   Open wound of left foot 11/04/2017   Essential hypertension 10/27/2017   Solitary pulmonary nodule 10/07/2017   Acute on chronic right-sided congestive heart failure ([place])    Restrictive lung disease secondary to obesity 10/01/2017   Polycythemia, secondary 10/01/2017   Chronic viral hepatitis B without delta-agent ([place]) 07/08/2017   COPD (chronic obstructive pulmonary disease) with chronic bronchitis ([place]) 06/27/2017   Chronic kidney disease (CKD), stage IV (severe) ([place]) 01/14/2017   Type 2 diabetes mellitus with stage 3 chronic kidney disease, with long-term current use of insulin ([place]) 12/12/2016   Urge incontinence of urine 12/05/2016   Trigger index finger of left hand 07/15/2016   Back pain 04/07/2014   Obesity 10/02/2012   Chronic diastolic heart failure ([place]) 04/07/2012   GERD 01/26/2010   Hypertension 08/04/2008   Obstructive sleep apnea treated with BiPAP 02/03/2008   FIBROCYSTIC BREAST DISEASE 10/28/2006   Hyperlipidemia 03/27/2006   Obesity hypoventilation  syndrome ([place]) 03/27/2006   Former tobacco use 03/27/2006   Depression with anxiety 03/27/2006   Past Medical History:  Diagnosis Date   Anxiety    Arthritis 04-10-11   hips, shoulders, back   Asthma    Bipolar disorder ([place])    Cancer ([place]) 1993   cervical, no treatment done, went away per pt   Cervical dysplasia or atypia 04-10-11   '93- once dx.-got pregnant-no intervention, then postpartum, no dysplasia found   CHF (congestive heart failure) ([place])    no cardiologist 2014 dx, none now   Chronic kidney disease    stage 3 kidney disease   Condyloma - gluteal cleft 04/09/2011   Removed by general surgery. Pathology showed Condyloma, gluteal CONDYLOMA ACUMINATUM.    COPD (chronic obstructive pulmonary disease) ([facility])    Diabetes mellitus  without complication ([place])    Dyspnea    with actity, sitting   Fall 12/16/2016   Fibrocystic breast disease    GERD (gastroesophageal reflux disease)    Hepatitis    hep B-count is low at present,doesn't register   Hypercalcemia    Hyperlipidemia    Hypertension    Lung nodule 11/2017   right and left lung   Migraine headache    none recent   Morbid obesity ([place]) 03/27/2006   Neuropathy    Pneumonia    walking pneu 15 yrs. ago   Skin lesion 03/15/2011   In gluteal crease now s/p removal by Dr. [name] of General Surgery on 3/12. Path shows condyloma.      Sleep apnea 04-10-11   uses cpap, pt does not know settings   Sleep apnea    Trigger finger    left third    Family History  Problem Relation Age of Onset   Pulmonary embolism Mother    Stroke Father    Alcohol abuse Father    Pneumonia Father    Cancer Maternal Aunt        breast   Breast cancer Maternal Aunt    Asthma Other    COPD Other    Hypertension Other    Diabetes Other    Asthma Sister    Depression Sister    Arthritis Sister    Sickle cell anemia Son     Past Surgical History:  Procedure Laterality Date   ANAL FISTULECTOMY  04/17/2011   Procedure: FISTULECTOMY ANAL;  Surgeon: Imogene Burn. [name], MD;  Location: [facility];  Service: General;  Laterality: N/A;  Excision of Condyloma Gluteal Cleft    BACK SURGERY  04-10-11   x5-Lumbar fusion-retained hardware.(Dr. Louanne Skye)   BREAST CYST EXCISION     bilateral breast, 3 cysts removed from each breast   BREAST EXCISIONAL BIOPSY Right    x 3   BREAST EXCISIONAL BIOPSY Left    x 3   CARPAL TUNNEL RELEASE Bilateral    Cervical biospy     COLONOSCOPY WITH PROPOFOL N/A 07/06/2015   Procedure: COLONOSCOPY WITH PROPOFOL;  Surgeon: [name], MD;  Location: [facility];  Service: Endoscopy;  Laterality: N/A;   DOPPLER ECHOCARDIOGRAPHY  04/06/2012   AT [place] 55-60%   FRACTURE SURGERY     I&D EXTREMITY Left  12/18/2016   Procedure: IRRIGATION AND DEBRIDEMENT LEFT FOOT, CLOSURE;  Surgeon: [name], MD;  Location: [facility];  Service: Orthopedics;  Laterality: Left;   MULTIPLE TOOTH EXTRACTIONS     NECK SURGERY  04-10-11   x3- cervical fusion with plating and screws-Dr. [name]  TEE WITHOUT CARDIOVERSION N/A 04/06/2012   Procedure: TRANSESOPHAGEAL ECHOCARDIOGRAM (TEE);  Surgeon: [name], MD;  Location: [facility];  Service: Cardiovascular;  Laterality: N/A;   TRIGGER FINGER RELEASE Right    middle finger   TRIGGER FINGER RELEASE Left 06/28/2016   Procedure: RELEASE TRIGGER FINGER LEFT 3RD FINGER;  Surgeon: [name], MD;  Location: [facility];  Service: Orthopedics;  Laterality: Left;   TRIGGER FINGER RELEASE Right 06/12/2016   Procedure: RIGHT INDEX FINGER TRIGGER RELEASE;  Surgeon: [name], MD;  Location: [facility];  Service: Orthopedics;  Laterality: Right;   TRIGGER FINGER RELEASE Right 01/19/2018   Procedure: RIGHT RING FINGER TRIGGER FINGER RELEASE;  Surgeon: [name], MD;  Location: [facility];  Service: Orthopedics;  Laterality: Right;   Social History   Occupational History    Comment: Disabled  Tobacco Use   Smoking status: Current Some Day Smoker    Packs/day: 0.25    Years: 40.00    Pack years: 10.00    Types: Cigarettes    Start date: 01/28/1977    Last attempt to quit: 09/07/2017    Years since quitting: 1.0   Smokeless tobacco: Never Used   Tobacco comment: Quit prior to hospitalization using low dose Chantix  Substance and Sexual Activity   Alcohol use: No    Alcohol/week: 0.0 standard drinks   Drug use: Not Currently    Types: Marijuana, "Crack" cocaine    Comment: hx marijuana usemany years ago, Crack -none since 11/2015   Sexual activity: Not Currently    Partners: Male    Birth control/protection: Post-menopausal

## 2018-09-21 ENCOUNTER — Ambulatory Visit
Admission: RE | Admit: 2018-09-21 | Discharge: 2018-09-21 | Disposition: A | Discharge status: Home / Self Care | Payer: Medicare Other | Source: Ambulatory Visit | Attending: Specialist | Admitting: Specialist

## 2018-09-21 ENCOUNTER — Other Ambulatory Visit: Payer: Self-pay

## 2018-09-21 DIAGNOSIS — M4802 Spinal stenosis, cervical region: Secondary | ICD-10-CM | POA: Diagnosis not present

## 2018-09-21 DIAGNOSIS — M542 Cervicalgia: Secondary | ICD-10-CM

## 2018-09-22 ENCOUNTER — Telehealth: Payer: Self-pay

## 2018-09-22 NOTE — Telephone Encounter (Signed)
Per Mychart message Good blessed morning Dr. Harrell Gave, I have gained 5.8 lbs in 3 days of fluid yesterday weight was 385.2 and today weight is 391. My swelling is still on my left side. I went and had my MRI done yesterday and I'm stressed a lot about 2 death on both sides of my family and 2 close friends die in in the last 2 weeks plus I need surgery on my back and neck. So I have a lot going on. My thinking is off some. My blood pressure is not bad it rans in the 120+ and today it is 156/98 and my pulse is 98. Just letting you know what is going on with me.   Called pt and she states that she did not take her lasix yesterday d/t MRI yesterday. She has taken today and she will message back tomorrow. She will email tomorrow with her progress on her weight/lasix tomorrow.

## 2018-09-23 ENCOUNTER — Other Ambulatory Visit: Payer: Self-pay | Admitting: Student in an Organized Health Care Education/Training Program

## 2018-09-23 DIAGNOSIS — M549 Dorsalgia, unspecified: Secondary | ICD-10-CM

## 2018-09-24 ENCOUNTER — Encounter (HOSPITAL_COMMUNITY): Payer: Self-pay

## 2018-09-24 ENCOUNTER — Observation Stay (HOSPITAL_COMMUNITY)
Admission: EM | Admit: 2018-09-24 | Discharge: 2018-09-25 | Disposition: A | Discharge status: Home / Self Care | Payer: Medicare Other | Attending: Family Medicine | Admitting: Family Medicine

## 2018-09-24 ENCOUNTER — Other Ambulatory Visit: Payer: Self-pay

## 2018-09-24 ENCOUNTER — Emergency Department (HOSPITAL_COMMUNITY): Payer: Medicare Other

## 2018-09-24 DIAGNOSIS — Z20828 Contact with and (suspected) exposure to other viral communicable diseases: Secondary | ICD-10-CM | POA: Insufficient documentation

## 2018-09-24 DIAGNOSIS — I5032 Chronic diastolic (congestive) heart failure: Secondary | ICD-10-CM | POA: Diagnosis not present

## 2018-09-24 DIAGNOSIS — I447 Left bundle-branch block, unspecified: Secondary | ICD-10-CM

## 2018-09-24 DIAGNOSIS — G4733 Obstructive sleep apnea (adult) (pediatric): Secondary | ICD-10-CM | POA: Diagnosis not present

## 2018-09-24 DIAGNOSIS — E1122 Type 2 diabetes mellitus with diabetic chronic kidney disease: Secondary | ICD-10-CM | POA: Insufficient documentation

## 2018-09-24 DIAGNOSIS — I130 Hypertensive heart and chronic kidney disease with heart failure and stage 1 through stage 4 chronic kidney disease, or unspecified chronic kidney disease: Secondary | ICD-10-CM | POA: Diagnosis not present

## 2018-09-24 DIAGNOSIS — Z6841 Body Mass Index (BMI) 40.0 and over, adult: Secondary | ICD-10-CM | POA: Diagnosis not present

## 2018-09-24 DIAGNOSIS — R072 Precordial pain: Principal | ICD-10-CM | POA: Insufficient documentation

## 2018-09-24 DIAGNOSIS — J449 Chronic obstructive pulmonary disease, unspecified: Secondary | ICD-10-CM | POA: Diagnosis not present

## 2018-09-24 DIAGNOSIS — F419 Anxiety disorder, unspecified: Secondary | ICD-10-CM | POA: Insufficient documentation

## 2018-09-24 DIAGNOSIS — J302 Other seasonal allergic rhinitis: Secondary | ICD-10-CM | POA: Insufficient documentation

## 2018-09-24 DIAGNOSIS — F17210 Nicotine dependence, cigarettes, uncomplicated: Secondary | ICD-10-CM | POA: Diagnosis not present

## 2018-09-24 DIAGNOSIS — N3281 Overactive bladder: Secondary | ICD-10-CM | POA: Diagnosis not present

## 2018-09-24 DIAGNOSIS — E785 Hyperlipidemia, unspecified: Secondary | ICD-10-CM | POA: Insufficient documentation

## 2018-09-24 DIAGNOSIS — G8929 Other chronic pain: Secondary | ICD-10-CM | POA: Diagnosis not present

## 2018-09-24 DIAGNOSIS — Z9981 Dependence on supplemental oxygen: Secondary | ICD-10-CM | POA: Insufficient documentation

## 2018-09-24 DIAGNOSIS — R079 Chest pain, unspecified: Secondary | ICD-10-CM | POA: Diagnosis present

## 2018-09-24 DIAGNOSIS — Z79899 Other long term (current) drug therapy: Secondary | ICD-10-CM | POA: Insufficient documentation

## 2018-09-24 DIAGNOSIS — E1140 Type 2 diabetes mellitus with diabetic neuropathy, unspecified: Secondary | ICD-10-CM | POA: Insufficient documentation

## 2018-09-24 DIAGNOSIS — F329 Major depressive disorder, single episode, unspecified: Secondary | ICD-10-CM | POA: Diagnosis not present

## 2018-09-24 DIAGNOSIS — R000 Tachycardia, unspecified: Secondary | ICD-10-CM | POA: Diagnosis not present

## 2018-09-24 DIAGNOSIS — N184 Chronic kidney disease, stage 4 (severe): Secondary | ICD-10-CM | POA: Diagnosis not present

## 2018-09-24 DIAGNOSIS — Z794 Long term (current) use of insulin: Secondary | ICD-10-CM | POA: Diagnosis not present

## 2018-09-24 DIAGNOSIS — K219 Gastro-esophageal reflux disease without esophagitis: Secondary | ICD-10-CM | POA: Insufficient documentation

## 2018-09-24 DIAGNOSIS — Z885 Allergy status to narcotic agent status: Secondary | ICD-10-CM | POA: Insufficient documentation

## 2018-09-24 DIAGNOSIS — Z888 Allergy status to other drugs, medicaments and biological substances status: Secondary | ICD-10-CM | POA: Insufficient documentation

## 2018-09-24 DIAGNOSIS — J9611 Chronic respiratory failure with hypoxia: Secondary | ICD-10-CM | POA: Insufficient documentation

## 2018-09-24 DIAGNOSIS — I471 Supraventricular tachycardia: Secondary | ICD-10-CM | POA: Diagnosis not present

## 2018-09-24 DIAGNOSIS — R0602 Shortness of breath: Secondary | ICD-10-CM | POA: Diagnosis not present

## 2018-09-24 DIAGNOSIS — Z886 Allergy status to analgesic agent status: Secondary | ICD-10-CM | POA: Insufficient documentation

## 2018-09-24 HISTORY — DX: Left bundle-branch block, unspecified: I44.7

## 2018-09-24 LAB — URINALYSIS, ROUTINE W REFLEX MICROSCOPIC
Bilirubin Urine: NEGATIVE
Glucose, UA: >=500 mg/dL — AB
Ketones, ur: NEGATIVE mg/dL
Leukocytes,Ua: NEGATIVE
Nitrite: NEGATIVE
Protein, ur: NEGATIVE mg/dL
Specific Gravity, Urine: 1.007 (ref 1.005–1.030)
pH: 6 (ref 5.0–8.0)

## 2018-09-24 LAB — TROPONIN I (HIGH SENSITIVITY): Troponin I (High Sensitivity): 6 ng/L (ref <18)

## 2018-09-24 LAB — CBC
HCT: 46.7 % — ABNORMAL HIGH (ref 36.0–46.0)
Hemoglobin: 16 g/dL — ABNORMAL HIGH (ref 12.0–15.0)
MCH: 27.1 pg (ref 26.0–34.0)
MCHC: 34.3 g/dL (ref 30.0–36.0)
MCV: 79 fL — ABNORMAL LOW (ref 80.0–100.0)
Platelets: 282 10*3/uL (ref 150–400)
RBC: 5.91 MIL/uL — ABNORMAL HIGH (ref 3.87–5.11)
RDW: 16.6 % — ABNORMAL HIGH (ref 11.5–15.5)
WBC: 10.4 10*3/uL (ref 4.0–10.5)
nRBC: 0 % (ref 0.0–0.2)

## 2018-09-24 LAB — CBC WITH DIFFERENTIAL/PLATELET
Abs Immature Granulocytes: 0.03 10*3/uL (ref 0.00–0.07)
Basophils Absolute: 0.1 10*3/uL (ref 0.0–0.1)
Basophils Relative: 1 %
Eosinophils Absolute: 0.3 10*3/uL (ref 0.0–0.5)
Eosinophils Relative: 3 %
HCT: 48.3 % — ABNORMAL HIGH (ref 36.0–46.0)
Hemoglobin: 16.7 g/dL — ABNORMAL HIGH (ref 12.0–15.0)
Immature Granulocytes: 0 %
Lymphocytes Relative: 27 %
Lymphs Abs: 3.1 10*3/uL (ref 0.7–4.0)
MCH: 27.3 pg (ref 26.0–34.0)
MCHC: 34.6 g/dL (ref 30.0–36.0)
MCV: 79.1 fL — ABNORMAL LOW (ref 80.0–100.0)
Monocytes Absolute: 1 10*3/uL (ref 0.1–1.0)
Monocytes Relative: 9 %
Neutro Abs: 7.1 10*3/uL (ref 1.7–7.7)
Neutrophils Relative %: 60 %
Platelets: 294 10*3/uL (ref 150–400)
RBC: 6.11 MIL/uL — ABNORMAL HIGH (ref 3.87–5.11)
RDW: 17.3 % — ABNORMAL HIGH (ref 11.5–15.5)
WBC: 11.6 10*3/uL — ABNORMAL HIGH (ref 4.0–10.5)
nRBC: 0 % (ref 0.0–0.2)

## 2018-09-24 LAB — BASIC METABOLIC PANEL
Anion gap: 17 — ABNORMAL HIGH (ref 5–15)
BUN: 32 mg/dL — ABNORMAL HIGH (ref 6–20)
CO2: 25 mmol/L (ref 22–32)
Calcium: 9.2 mg/dL (ref 8.9–10.3)
Chloride: 95 mmol/L — ABNORMAL LOW (ref 98–111)
Creatinine, Ser: 1.61 mg/dL — ABNORMAL HIGH (ref 0.44–1.00)
GFR calc Af Amer: 41 mL/min — ABNORMAL LOW (ref >60)
GFR calc non Af Amer: 36 mL/min — ABNORMAL LOW (ref >60)
Glucose, Bld: 173 mg/dL — ABNORMAL HIGH (ref 70–99)
Potassium: 3.9 mmol/L (ref 3.5–5.1)
Sodium: 137 mmol/L (ref 135–145)

## 2018-09-24 LAB — SARS CORONAVIRUS 2 BY RT PCR (HOSPITAL ORDER, PERFORMED IN ~~LOC~~ HOSPITAL LAB): SARS Coronavirus 2: NEGATIVE

## 2018-09-24 LAB — BRAIN NATRIURETIC PEPTIDE: B Natriuretic Peptide: 41.7 pg/mL (ref 0.0–100.0)

## 2018-09-24 MED ORDER — FOLIC ACID 1 MG PO TABS
1.0000 mg | ORAL_TABLET | Freq: Every day | ORAL | Status: DC
  Administered 2018-09-25: 1 mg via ORAL
  Filled 2018-09-24: qty 1

## 2018-09-24 MED ORDER — CANAGLIFLOZIN 100 MG PO TABS
100.0000 mg | ORAL_TABLET | Freq: Every day | ORAL | Status: DC
  Administered 2018-09-25: 100 mg via ORAL
  Filled 2018-09-24: qty 1

## 2018-09-24 MED ORDER — FLAXSEED OIL 1000 MG PO CAPS: 1000.0000 mg | ORAL_CAPSULE | Freq: Every day | ORAL | Status: DC

## 2018-09-24 MED ORDER — INSULIN ASPART 100 UNIT/ML ~~LOC~~ SOLN
0.0000 [IU] | Freq: Three times a day (TID) | SUBCUTANEOUS | Status: DC
  Administered 2018-09-25 (×2): 1 [IU] via SUBCUTANEOUS

## 2018-09-24 MED ORDER — BUSPIRONE HCL 10 MG PO TABS
10.0000 mg | ORAL_TABLET | Freq: Three times a day (TID) | ORAL | Status: DC
  Administered 2018-09-25 (×2): 10 mg via ORAL
  Filled 2018-09-24 (×2): qty 1

## 2018-09-24 MED ORDER — FLUTICASONE PROPIONATE 50 MCG/ACT NA SUSP
2.0000 | Freq: Two times a day (BID) | NASAL | Status: DC
  Administered 2018-09-25 (×2): 2 via NASAL
  Filled 2018-09-24: qty 16

## 2018-09-24 MED ORDER — ENOXAPARIN SODIUM 80 MG/0.8ML ~~LOC~~ SOLN
80.0000 mg | Freq: Every day | SUBCUTANEOUS | Status: DC
  Administered 2018-09-25: 80 mg via SUBCUTANEOUS
  Filled 2018-09-24: qty 0.8

## 2018-09-24 MED ORDER — ADULT MULTIVITAMIN W/MINERALS CH
1.0000 | ORAL_TABLET | Freq: Every day | ORAL | Status: DC
  Administered 2018-09-25: 1 via ORAL
  Filled 2018-09-24: qty 1

## 2018-09-24 MED ORDER — METOLAZONE 2.5 MG PO TABS: 2.5000 mg | ORAL_TABLET | ORAL | Status: DC

## 2018-09-24 MED ORDER — PROMETHAZINE HCL 25 MG PO TABS: 25.0000 mg | ORAL_TABLET | Freq: Four times a day (QID) | ORAL | Status: DC | PRN

## 2018-09-24 MED ORDER — HYDROXYZINE HCL 10 MG PO TABS: 10.0000 mg | ORAL_TABLET | Freq: Every day | ORAL | Status: DC | PRN

## 2018-09-24 MED ORDER — ATORVASTATIN CALCIUM 40 MG PO TABS
40.0000 mg | ORAL_TABLET | Freq: Every day | ORAL | Status: DC
  Administered 2018-09-25: 40 mg via ORAL
  Filled 2018-09-24: qty 1

## 2018-09-24 MED ORDER — KETOCONAZOLE 2 % EX CREA
1.0000 "application " | TOPICAL_CREAM | Freq: Every day | CUTANEOUS | Status: DC
  Administered 2018-09-25: 1 via TOPICAL
  Filled 2018-09-24: qty 15

## 2018-09-24 MED ORDER — BACLOFEN 10 MG PO TABS
10.0000 mg | ORAL_TABLET | Freq: Three times a day (TID) | ORAL | Status: DC | PRN
  Administered 2018-09-25: 10 mg via ORAL
  Filled 2018-09-24 (×2): qty 1

## 2018-09-24 MED ORDER — HYDROCODONE-ACETAMINOPHEN 7.5-325 MG PO TABS
1.0000 | ORAL_TABLET | Freq: Four times a day (QID) | ORAL | Status: DC | PRN
  Administered 2018-09-25 (×2): 1 via ORAL
  Filled 2018-09-24 (×2): qty 1

## 2018-09-24 MED ORDER — SERTRALINE HCL 50 MG PO TABS
25.0000 mg | ORAL_TABLET | Freq: Every day | ORAL | Status: DC
  Administered 2018-09-25: 25 mg via ORAL
  Filled 2018-09-24: qty 1

## 2018-09-24 MED ORDER — DOCUSATE SODIUM 100 MG PO CAPS
200.0000 mg | ORAL_CAPSULE | Freq: Two times a day (BID) | ORAL | Status: DC
  Administered 2018-09-25 (×2): 200 mg via ORAL
  Filled 2018-09-24 (×2): qty 2

## 2018-09-24 MED ORDER — DILTIAZEM HCL ER COATED BEADS 120 MG PO CP24
120.0000 mg | ORAL_CAPSULE | Freq: Two times a day (BID) | ORAL | Status: DC
  Administered 2018-09-25 (×2): 120 mg via ORAL
  Filled 2018-09-24 (×2): qty 1

## 2018-09-24 MED ORDER — FLUTICASONE-UMECLIDIN-VILANT 100-62.5-25 MCG/INH IN AEPB: 1.0000 | INHALATION_SPRAY | Freq: Every day | RESPIRATORY_TRACT | Status: DC

## 2018-09-24 MED ORDER — ACETAMINOPHEN 500 MG PO TABS: 1000.0000 mg | ORAL_TABLET | Freq: Four times a day (QID) | ORAL | Status: DC | PRN

## 2018-09-24 MED ORDER — ALBUTEROL SULFATE HFA 108 (90 BASE) MCG/ACT IN AERS
2.0000 | INHALATION_SPRAY | Freq: Four times a day (QID) | RESPIRATORY_TRACT | Status: DC | PRN
  Filled 2018-09-24: qty 6.7

## 2018-09-24 MED ORDER — CALCIUM CARBONATE-VITAMIN D 500-200 MG-UNIT PO TABS
1.0000 | ORAL_TABLET | Freq: Every day | ORAL | Status: DC
  Administered 2018-09-25: 1 via ORAL
  Filled 2018-09-24: qty 1

## 2018-09-24 MED ORDER — DICLOFENAC SODIUM 1 % TD GEL
4.0000 g | Freq: Three times a day (TID) | TRANSDERMAL | Status: DC
  Administered 2018-09-25 (×2): 4 g via TOPICAL
  Filled 2018-09-24: qty 100

## 2018-09-24 MED ORDER — VARENICLINE TARTRATE 0.5 MG PO TABS
0.5000 mg | ORAL_TABLET | Freq: Two times a day (BID) | ORAL | Status: DC
  Administered 2018-09-25: 0.5 mg via ORAL
  Filled 2018-09-24 (×2): qty 1

## 2018-09-24 MED ORDER — OMEGA-3-ACID ETHYL ESTERS 1 G PO CAPS
1.0000 g | ORAL_CAPSULE | Freq: Every day | ORAL | Status: DC
  Administered 2018-09-25: 1 g via ORAL
  Filled 2018-09-24: qty 1

## 2018-09-24 MED ORDER — LORATADINE 10 MG PO TABS
10.0000 mg | ORAL_TABLET | Freq: Every day | ORAL | Status: DC
  Administered 2018-09-25: 10 mg via ORAL
  Filled 2018-09-24: qty 1

## 2018-09-24 MED ORDER — INSULIN GLARGINE 100 UNIT/ML ~~LOC~~ SOLN
10.0000 [IU] | Freq: Every day | SUBCUTANEOUS | Status: DC
  Administered 2018-09-25: 10 [IU] via SUBCUTANEOUS
  Filled 2018-09-24 (×2): qty 0.1

## 2018-09-24 MED ORDER — MONTELUKAST SODIUM 10 MG PO TABS
10.0000 mg | ORAL_TABLET | Freq: Every day | ORAL | Status: DC
  Administered 2018-09-25: 10 mg via ORAL
  Filled 2018-09-24: qty 1

## 2018-09-24 MED ORDER — FUROSEMIDE 80 MG PO TABS
80.0000 mg | ORAL_TABLET | Freq: Two times a day (BID) | ORAL | Status: DC
  Administered 2018-09-25: 80 mg via ORAL
  Filled 2018-09-24: qty 1

## 2018-09-24 MED ORDER — LOSARTAN POTASSIUM 50 MG PO TABS
100.0000 mg | ORAL_TABLET | Freq: Every day | ORAL | Status: DC
  Administered 2018-09-25: 100 mg via ORAL
  Filled 2018-09-24: qty 2

## 2018-09-24 MED ORDER — AMMONIUM LACTATE 12 % EX LOTN: 1.0000 "application " | TOPICAL_LOTION | Freq: Two times a day (BID) | CUTANEOUS | Status: DC | PRN

## 2018-09-24 MED ORDER — FESOTERODINE FUMARATE ER 8 MG PO TB24
8.0000 mg | ORAL_TABLET | Freq: Every day | ORAL | Status: DC
  Administered 2018-09-25: 8 mg via ORAL
  Filled 2018-09-24: qty 1

## 2018-09-24 MED ORDER — GABAPENTIN 300 MG PO CAPS
600.0000 mg | ORAL_CAPSULE | Freq: Three times a day (TID) | ORAL | Status: DC
  Administered 2018-09-25 (×2): 600 mg via ORAL
  Filled 2018-09-24 (×2): qty 2

## 2018-09-24 MED ORDER — PANTOPRAZOLE SODIUM 40 MG PO TBEC
40.0000 mg | DELAYED_RELEASE_TABLET | Freq: Every day | ORAL | Status: DC
  Administered 2018-09-25: 40 mg via ORAL
  Filled 2018-09-24: qty 1

## 2018-09-24 NOTE — ED Notes (Signed)
Pt is currently resting in a comfortable position and complains of having to urinate. Chest pain has not gotten better or worse with movement since her arrival to the ER. Bedside monitor is connected.

## 2018-09-24 NOTE — ED Notes (Signed)
Patient transported to X-ray 

## 2018-09-24 NOTE — Telephone Encounter (Signed)
Thank you. Agree with ER recommendation.

## 2018-09-24 NOTE — H&P (Addendum)
[facility] Admission History and Physical Service Pager: [phone]  Patient name: [name] Medical record number: [id number] Date of birth: 1965-08-29 Age: 53 y.o. Gender: female  Primary Care Provider: [name], MD Consultants: Cardiology Code Status: Full  Chief Complaint: Chest pain  Assessment and Plan: [name] is a 53 y.o. female presenting with less pain. PMH is significant for hypertension, COPD, CKD stage IV, T2 DM, depression/anxiety, OSA  Chest pain with associated shortness of breath Patient reports multiple episodes of chest pain that starts in her chest and radiates to her arm over the last week.  She had one episode last Saturday, one episode on Sunday and 2 episodes today.  Previous chest pain episodes resolved after about a minute but today lasted longer.  The one this morning resolved after a few minutes.  The one this afternoon did not resolve until after she had arrived to the emergency room.  While having chest pain the patient also says she is increased shortness of breath.  It happens both at rest and is exertional.  She is on 2 L of oxygen at home and those 2 L do not resolve the dyspnea.  On arrival to the emergency room EKG showed sinus rhythm with atrial premature complex, incomplete left bundle branch block, and borderline prolonged QT interval of 486.  Initial troponin was 6.  A BNP was also drawn and was 41.7 which is approximately baseline.  Emergency room provider spoke with cardiology who recommended admission.  Osborne consult cardiology in the morning for further recommendations.  Patient does have multiple cardiac risk factors including CKD, diabetes, hypertension, morbid obesity, OSA, likely OHS.  Overall does not appear to be ACS or as though patient is having a CHF exacerbation.  Patient is not tachycardic, Osborne score 0, do not feel that this is PE at this time.  Can consider further work-up if does not improve.   Patient also has history of COPD and is currently smoking, however has no wheeze on exam and does not appear to be in C OPD exacerbation either.  Chest x-ray did not support fluid overload or pneumonia so these are also unlikely. -Admit to FPTS, attending Dr. Andria Frames -Continue to trend troponins - Consult cardiology in the morning - Continuous telemetry - Continuous pulse ox - Continue home hypertensive and other cardiac medications  HTN Normotensive on admission.  Patient takes diltiazem 120 mg twice daily, Lasix 80 mg twice daily, losartan 100 mg daily, - Continue home medications above    COPD Patient has a 40 pack year history of smoking.  Had quit but has recently restarted due to increased life stressors.  Reports smoking 3 to 4 cigarettes daily.  Patient takes pro-air and Trelegy Ellipta - Continue Trelegy Ellipta and pro-air while admitted -Oxygen saturation goal between 88 and 92% -Continuous pulse ox  CKD stage IV Creatinine is 1.61 on admission.  Baseline appears 1.5 and 1.8 -Daily BMP -Replace electrolytes as indicated -Avoid nephrotoxic drugs  T2 DM  Home meds include Jardiance,  Lantus 20 units at breakfast, Humalog 6 units 3 times a day before meals. -Invokana 100 mg daily -Lantus 10 units at breakfast - Sensitive sliding scale insulin - Monitor CBGs 3 times daily/AC  Anxiety/depression Patient currently taking Zoloft 25 mg daily and buspirone 10 mg 3 times a day.  Patient reports depression due to the recent loss of family members.  Patient reports she has been smoking again 3 to 4 cigarettes a  day because of her depression. - Continue Zoloft and BuSpar -Patient denies the need or want for nicotine patches/gum -Continue Chantix  OSA Patient reports she is on BiPAP at home. - BiPAP in hospital nightly  Chronic pain Home med includes Norco 7.5-325 every 6 hours as needed, gabapentin, baclofen, Voltaren gel, Tylenol 1000 mg every 6 - Continue home  meds  Seasonal allergies Patient takes montelukast, loratadine, Flonase at home -Continue home meds  Overactive bladder Patient takes oxybutynin 10 mg at bedtime and Toviaz 8 mg daily -Continue home medications  Hyperlipidemia Patient on atorvastatin 40 mg daily.  Last lipid panel was over a year ago. -Continue to start atorvastatin 40 mg daily - We Osborne repeat a lipid panel on this admission.  FEN/GI: Heart healthy Prophylaxis: Lovenox 40 mg  Disposition: Pending cardiology  History of Present Illness:  [name] is a 53 y.o. female presenting with rest pain  Patient reports multiple episodes of chest pain and shortness of breath since Saturday.  She reports one episode Saturday, one episode Sunday, and 2 episodes today.  She reports that most days they have occurred later in the day and last around a minute but this morning she noticed her feet were swelling and she had a short episode of chest pain with heart palpitations.  She is on 2 L of oxygen at home and that did not help.  She called her cardiologist who told her to come in if she had another episode.  Soon she got off the phone with cardiologist she had another episode of chest pain with shortness of breath that did not resolve and radiated to her shoulder.  The pain happens at rest and with movement.  Patient reports that she did not take anything for the previous episodes of chest pain but when EMS arrived they gave her 4 aspirin.  She had chest pain until arriving to the emergency room and it resolved.  She also notes she had a headache today which is since resolved.  Patient reports her weight has also been fluctuating over the last 15 days.  She gained approximately 15 pounds of fluid in 1 day and has been trying to get that fluid off since then.  She reports loss of 3 pounds yesterday and 4 additional pounds in the last week.  On arrival to the ER the patient received a EKG and troponins were drawn.EKG showed sinus  rhythm with atrial premature complex, incomplete left bundle branch block, and borderline prolonged QT interval of 486.  Initial troponin was 6.  A BNP was also drawn and was 41.7 which is approximately baseline.  Her WBC-11.6, urine showed small hemoglobin with rare bacteria.Chest x-ray showed no active cardiopulmonary disease.  The emergency room provider spoke with cardiologist Dr. Radford Pax who recommended medical admission for further work-up of the shortness of breath and chest pain.  Review Of Systems: Per HPI with the following additions:   Review of Systems  Constitutional: Positive for weight loss (Intentional). Negative for fever.  HENT: Positive for congestion. Negative for hearing loss.   Eyes: Negative for blurred vision and double vision.  Respiratory: Positive for cough.   Cardiovascular: Positive for chest pain, palpitations and leg swelling.  Gastrointestinal: Negative for abdominal pain, constipation, diarrhea, nausea and vomiting.  Genitourinary: Negative for dysuria and urgency.  Neurological: Positive for headaches.  Psychiatric/Behavioral: Positive for depression. Negative for substance abuse.    Patient Active Problem List   Diagnosis Date Noted  . Chest pain 09/24/2018  .  Tobacco abuse 08/21/2018  . Right groin pain 07/20/2018  . Depressed mood 07/13/2018  . Mouth pain 06/02/2018  . Paroxysmal SVT (supraventricular tachycardia) ([place]) 03/20/2018  . Pure hypercholesterolemia 03/20/2018  . Muscle cramping 02/25/2018  . Chest pressure 02/20/2018  . Nodule of upper lobe of right lung 02/13/2018  . Falls, subsequent encounter 01/30/2018  . Open wound of left foot 11/04/2017  . Essential hypertension 10/27/2017  . Solitary pulmonary nodule 10/07/2017  . Acute on chronic right-sided congestive heart failure ([place])   . Restrictive lung disease secondary to obesity 10/01/2017  . Polycythemia, secondary 10/01/2017  . Chronic viral hepatitis B without delta-agent ([place])  07/08/2017  . COPD (chronic obstructive pulmonary disease) with chronic bronchitis ([place]) 06/27/2017  . Chronic kidney disease (CKD), stage IV (severe) ([place]) 01/14/2017  . Type 2 diabetes mellitus with stage 3 chronic kidney disease, with long-term current use of insulin ([place]) 12/12/2016  . Urge incontinence of urine 12/05/2016  . Trigger index finger of left hand 07/15/2016  . Back pain 04/07/2014  . Obesity 10/02/2012  . Chronic diastolic heart failure ([place]) 04/07/2012  . GERD 01/26/2010  . Hypertension 08/04/2008  . Obstructive sleep apnea treated with BiPAP 02/03/2008  . FIBROCYSTIC BREAST DISEASE 10/28/2006  . Hyperlipidemia 03/27/2006  . Obesity hypoventilation syndrome ([place]) 03/27/2006  . Former tobacco use 03/27/2006  . Depression with anxiety 03/27/2006    Past Medical History: Past Medical History:  Diagnosis Date  . Anxiety   . Arthritis 04-10-11   hips, shoulders, back  . Asthma   . Bipolar disorder ([place])   . Cancer ([place]) 1993   cervical, no treatment done, went away per pt  . Cervical dysplasia or atypia 04-10-11   '93- once dx.-got pregnant-no intervention, then postpartum, no dysplasia found  . CHF (congestive heart failure) ([place])    no cardiologist 2014 dx, none now  . Chronic kidney disease    stage 3 kidney disease  . 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 Pathology showed Condyloma, gluteal CONDYLOMA ACUMINATUM.   [name] Kitchen COPD (chronic obstructive pulmonary disease) ([place])   . Diabetes mellitus without complication ([place])   . Dyspnea    with actity, sitting  . Fall 12/16/2016  . Fibrocystic breast disease   . GERD (gastroesophageal reflux disease)   . Hepatitis    hep B-count is low at present,doesn't register  . Hypercalcemia   . Hyperlipidemia   . Hypertension   . Lung nodule 11/2017   right and left lung  . Migraine headache    none recent  . Morbid obesity ([place]) 03/27/2006  . Neuropathy   . Pneumonia    walking pneu 15 yrs.  ago  . Skin lesion 03/15/2011   In gluteal crease now s/p removal by Dr. [name] of General Surgery on 3/12. Path shows condyloma.     . Sleep apnea 04-10-11   uses cpap, pt does not know settings  . Sleep apnea   . Trigger finger    left third    Past Surgical History: Past Surgical History:  Procedure Laterality Date  . ANAL FISTULECTOMY  04/17/2011   Procedure: FISTULECTOMY ANAL;  Surgeon: Imogene Burn. [name], MD;  Location: [facility];  Service: General;  Laterality: N/A;  Excision of Condyloma Gluteal Cleft   . BACK SURGERY  04-10-11   x5-Lumbar fusion-retained hardware.(Dr. Louanne Skye)  . BREAST CYST EXCISION     bilateral breast, 3 cysts removed from each breast  . BREAST EXCISIONAL BIOPSY Right  x 3  . BREAST EXCISIONAL BIOPSY Left    x 3  . CARPAL TUNNEL RELEASE Bilateral   . Cervical biospy    . COLONOSCOPY WITH PROPOFOL N/A 07/06/2015   Procedure: COLONOSCOPY WITH PROPOFOL;  Surgeon: [name], MD;  Location: [facility];  Service: Endoscopy;  Laterality: N/A;  . DOPPLER ECHOCARDIOGRAPHY  04/06/2012   AT [place] 55-60%  . FRACTURE SURGERY    . I&D EXTREMITY Left 12/18/2016   Procedure: IRRIGATION AND DEBRIDEMENT LEFT FOOT, CLOSURE;  Surgeon: [name], MD;  Location: [place];  Service: Orthopedics;  Laterality: Left;  [name] Kitchen MULTIPLE TOOTH EXTRACTIONS    . NECK SURGERY  04-10-11   x3- cervical fusion with plating and screws-Dr. [name]  . TEE WITHOUT CARDIOVERSION N/A 04/06/2012   Procedure: TRANSESOPHAGEAL ECHOCARDIOGRAM (TEE);  Surgeon: [name], MD;  Location[facility] END[facility]COPY;  Service: Cardiovascular;  Laterality: N/A;  . TRIGGER FINGER RELEASE Right    middle finger  . TRIGGER FINGER RELEASE Left 06/28/2016   Procedure: RELEASE TRIGGER FINGER LEFT 3RD FINGER;  Surgeon: [name], MD;  Location: [place];  Service: Orthopedics;  Laterality: Left;  . TRIGGER FINGER RELEASE Right 06/12/2016   Procedure: RIGHT INDEX FINGER TRIGGER RELEASE;  Surgeon: [name], MD;   Location: [place];  Service: Orthopedics;  Laterality: Right;  . TRIGGER FINGER RELEASE Right 01/19/2018   Procedure: RIGHT RING FINGER TRIGGER FINGER RELEASE;  Surgeon: [name], MD;  Location: [place];  Service: Orthopedics;  Laterality: Right;    Social History: Social History   Tobacco Use  . Smoking status: Current Some Day Smoker    Packs/day: 0.25    Years: 40.00    Pack years: 10.00    Types: Cigarettes    Start date: 01/28/1977    Last attempt to quit: 09/07/2017    Years since quitting: 1.0  . Smokeless tobacco: Never Used  . Tobacco comment: Quit prior to hospitalization using low dose Chantix  Substance Use Topics  . Alcohol use: No    Alcohol/week: 0.0 standard drinks  . Drug use: Not Currently    Types: Marijuana, "Crack" cocaine    Comment: hx marijuana usemany years ago, Crack -none since 11/2015   Additional social history: Smokes 4 cigarrettes a day,  Please also refer to relevant sections of EMR.  Family History: Family History  Problem Relation Age of Onset  . Pulmonary embolism Mother   . Stroke Father   . Alcohol abuse Father   . Pneumonia Father   . Cancer Maternal Aunt        breast  . Breast cancer Maternal Aunt   . Asthma Other   . COPD Other   . Hypertension Other   . Diabetes Other   . Asthma Sister   . Depression Sister   . Arthritis Sister   . Sickle cell anemia Son      Allergies and Medications: Allergies  Allergen Reactions  . Aspirin Other (See Comments)    Kidney disease  . Chantix [Varenicline Tartrate] Other (See Comments)    On the 1 mg dose, patient experienced hallucintation  . 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 Corticosteroids Other (See Comments)    Hyperglycemia   . Levofloxacin Itching   Current Facility-Administered Medications on File Prior to Encounter  Medication Dose Route Frequency Provider Last Rate Last Dose  . technetium tetrofosmin (TC-MYOVIEW) injection 09.3 millicurie   31.2  millicurie Intravenous Once PRN Sueanne Margarita, MD       Current Outpatient Medications on File Prior to Encounter  Medication Sig Dispense Refill  . ACCU-CHEK SOFTCLIX LANCETS lancets TEST 4 TIMES DAILY 200 each 6  . Acetaminophen (TYLENOL EXTRA STRENGTH PO) Take 1,000 mg by mouth 2 (two) times a day.    [name] Kitchen ammonium lactate (AMLACTIN) 12 % lotion Apply 1 application topically 2 (two) times daily as needed for dry skin. 400 g 0  . atorvastatin (LIPITOR) 40 MG tablet Take 1 tablet (40 mg total) by mouth daily. 90 tablet 3  . baclofen (LIORESAL) 10 MG tablet Take 1 tablet (10 mg total) by mouth 3 (three) times daily as needed for muscle spasms. 90 each 0  . blood glucose meter kit and supplies KIT Dispense based on patient and insurance preference. Use up to four times daily as directed. (FOR ICD-9 250.00, 250.01). 1 each 0  . busPIRone (BUSPAR) 10 MG tablet TAKE 1 TABLET BY MOUTH THREE TIMES A DAY (Patient taking differently: Take 10 mg by mouth 3 (three) times daily. ) 270 tablet 4  . Calcium Citrate-Vitamin D (CALCIUM CITRATE + D3 PO) Take 1 tablet by mouth daily.    . Capsaicin-Menthol (PAIN RELIEF EX) Apply 1 application topically 3 (three) times daily as needed (shoulder pain).    . CHANTIX 0.5 MG tablet TAKE 1 TABLET (0.5 MG TOTAL) BY MOUTH 2 (TWO) TIMES DAILY. (Patient taking differently: Take 0.5 mg by mouth 2 (two) times daily. ) 60 tablet 1  . diclofenac sodium (VOLTAREN) 1 % GEL APPLY 2 GRAMS TO AFFECTED AREA 4 TIMES A DAY (Patient taking differently: Apply 4 g topically 4 (four) times daily. Per pt: applies all over body) 300 g 2  . diltiazem (CARDIZEM CD) 120 MG 24 hr capsule Take 1 capsule (120 mg total) by mouth 2 (two) times daily. 180 capsule 3  . docusate sodium (COLACE) 100 MG capsule Take 200 mg by mouth 2 (two) times daily.    . empagliflozin (JARDIANCE) 25 MG TABS tablet Take 25 mg by mouth daily. 2250 mg 2  . febuxostat (ULORIC) 40 MG tablet Take 40 mg by mouth at  bedtime.     . Flaxseed, Linseed, (FLAXSEED OIL) 1000 MG CAPS Take 1,000 mg by mouth daily.    . fluticasone (FLONASE) 50 MCG/ACT nasal spray Place 2 sprays into both nostrils 2 (two) times daily. 16 g 6  . Fluticasone-Umeclidin-Vilant (TRELEGY ELLIPTA) 100-62.5-25 MCG/INH AEPB Inhale 1 puff into the lungs daily. 1 each 0  . folic acid (FOLVITE) 915 MCG tablet Take 400 mcg by mouth daily.    . furosemide (LASIX) 40 MG tablet Take 2 tablets (80 mg total) by mouth 2 (two) times daily. 180 tablet 3  . gabapentin (NEURONTIN) 300 MG capsule TAKE 2 CAPSULES (600 MG TOTAL) BY MOUTH 3 (THREE) TIMES DAILY. 540 capsule 2  . glucose blood (ACCU-CHEK AVIVA PLUS) test strip TEST 4 TIMES DAILY (Patient taking differently: 1 each by Other route 3 (three) times daily. TEST 3 TIMES DAILY) 100 each 12  . HYDROcodone-acetaminophen (NORCO) 7.5-325 MG tablet Take 1 tablet by mouth every 6 (six) hours as needed for moderate pain. 20 tablet 0  . hydrOXYzine (ATARAX/VISTARIL) 10 MG tablet TAKE 1 TABLET BY MOUTH EVERY DAY AS NEEDED (Patient taking differently: Take 10 mg by mouth daily as needed for itching. ) 90 tablet 1  . Insulin Glargine (LANTUS SOLOSTAR) 100 UNIT/ML Solostar Pen Inject 22 Units into  the skin daily before breakfast. (Patient taking differently: Inject 20 Units into the skin daily before breakfast. ) 15 mL   . insulin lispro (HUMALOG KWIKPEN) 100 UNIT/ML KwikPen Inject 0.06 mLs (6 Units total) into the skin 3 (three) times daily before meals. 15 mL   . Insulin Pen Needle (B-D UF III MINI PEN NEEDLES) 31G X 5 MM MISC CHECK SUGARS 4 TIMES A DAY BEFORE MEALS AND AT BEDTIME. 100 each 12  . ketoconazole (NIZORAL) 2 % cream APPLY 1 FINGERTIP AMOUNT TO EACH FOOT DAILY. (Patient taking differently: Apply 1 application topically daily. Apply 1 fingertip amount to each foot daily.) 30 g 0  . loratadine (CLARITIN) 10 MG tablet Take 1 tablet (10 mg total) by mouth daily. 90 tablet 0  . losartan (COZAAR) 100 MG tablet  Take 1 tablet (100 mg total) by mouth daily. 90 tablet 3  . metolazone (ZAROXOLYN) 2.5 MG tablet Take 1 tablet (2.5 mg total) by mouth 2 (two) times a week. TAKE 1 TABLET TWICE WEEKLY ON MONDAYS AND THURSDAYS ONLY 15 tablet 6  . montelukast (SINGULAIR) 10 MG tablet TAKE 1 TABLET AT BEDTIME (Patient taking differently: Take 10 mg by mouth at bedtime. ) 90 tablet 2  . Multiple Vitamins-Minerals (MULTIVITAMIN WITH MINERALS) tablet Take 1 tablet by mouth daily.    . mupirocin ointment (BACTROBAN) 2 % APPLY TO AFFECTED AREA TWICE A DAY (Patient taking differently: Apply 1 application topically 2 (two) times daily. ) 22 g 0  . NARCAN 4 MG/0.1ML LIQD nasal spray kit Place 1 spray into the nose once.     . NON FORMULARY Uses a C-PAP at bedtime    . nystatin (MYCOSTATIN) 100000 UNIT/ML suspension TAKE 5 MLS (500,000 UNITS TOTAL) BY MOUTH 4 (FOUR) TIMES DAILY. SWISH AND SWALLOW. 60 mL 3  . Omega-3 Fatty Acids (FISH OIL) 1000 MG CAPS Take 1,000 mg by mouth daily.     [name] Kitchen omeprazole (PRILOSEC) 20 MG capsule Take 1 capsule (20 mg total) by mouth daily as needed. 90 capsule 3  . potassium chloride SA (KLOR-CON M20) 20 MEQ tablet Take 1 tablet (20 mEq total) by mouth 2 (two) times a week. Take 1 tablet twice weekly on Monday and Thursday. 15 tablet 6  . PROAIR HFA 108 (90 Base) MCG/ACT inhaler INHALE 2 PUFFS INTO THE LUNGS EVERY 6 (SIX) HOURS AS NEEDED FOR SHORTNESS OF BREATH. (Patient taking differently: Inhale 2 puffs into the lungs every 6 (six) hours as needed for shortness of breath. ) 8.5 Inhaler 1  . promethazine (PHENERGAN) 25 MG tablet Take 1 tablet (25 mg total) by mouth every 6 (six) hours as needed for nausea. 30 tablet 1  . sertraline (ZOLOFT) 25 MG tablet TAKE 1 TABLET BY MOUTH EVERY DAY (Patient taking differently: Take 25 mg by mouth daily. ) 90 tablet 1  . TOVIAZ 8 MG TB24 tablet Take 8 mg by mouth daily.    . Vitamin D, Ergocalciferol, (DRISDOL) 50000 units CAPS capsule Take 50,000 Units by mouth  daily. For 30 days    . hydrOXYzine (VISTARIL) 100 MG capsule Take 1 capsule (100 mg total) by mouth 3 (three) times daily as needed for itching. (Patient not taking: Reported on 09/24/2018) 30 capsule 0  . oxybutynin (DITROPAN XL) 10 MG 24 hr tablet Take 1 tablet (10 mg total) by mouth at bedtime. 30 tablet 0    Objective: BP 113/74   Pulse 82   Temp 98.7 F (37.1 C) (Oral)   Resp 18  Ht '5\' 10"'  (1.778 m)   Wt (!) 174.6 kg   LMP 04/10/2006   SpO2 92%   BMI 55.24 kg/m  Physical Exam  Constitutional: She is oriented to person, place, and time.  Morbidly obese lying comfortably in bed with 3 L of O2 nasal cannula.  Patient reports she just "feels better on 3 L compared to 2"  HENT:  Head: Normocephalic and atraumatic.  Eyes: EOM are normal.  Neck: Normal range of motion. Neck supple. No thyromegaly present.  Cardiovascular: Normal rate and regular rhythm.  No murmur heard. Heart sounds distant due to body habitus  Pulmonary/Chest:  Patient mildly short of breath.  [place] exam difficult to assess due to body habitus.  Did not hear wheezes rhonchi or rales.  Abdominal: Soft. Bowel sounds are normal. There is no abdominal tenderness. There is no rebound and no guarding.  Musculoskeletal:        General: Edema (Trace edema in lower extremities bilaterally) present.  Neurological: She is alert and oriented to person, place, and time.  Skin: Skin is warm and dry.  Psychiatric: Affect normal.    Labs and Imaging: CBC BMET  Recent Labs  Lab 09/24/18 1821  WBC 11.6*  HGB 16.7*  HCT 48.3*  PLT 294   Recent Labs  Lab 09/24/18 1821  NA 137  K 3.9  CL 95*  CO2 25  BUN 32*  CREATININE 1.61*  GLUCOSE 173*  CALCIUM 9.2     Troponin-6 BNP-41.6  Dg Chest 2 View  Result Date: 09/24/2018 CLINICAL DATA:  Chest pain and shortness of breath. EXAM: CHEST - 2 VIEW COMPARISON:  01/27/2018 FINDINGS: Heart size and pulmonary vascularity are normal. No infiltrates or effusions. No  acute bone abnormality. IMPRESSION: No active cardiopulmonary disease. Electronically Signed   By: Lorriane Shire M.D.   On: 09/24/2018 18:14   Ct Lumbar Spine W Contrast  Result Date: 09/01/2018 CLINICAL DATA:  Lumbar spinal stenosis. Right greater than left low back and leg pain. Prior lumbar fusion. EXAM: LUMBAR MYELOGRAM FLUOROSCOPY TIME:  Fluoroscopy Time: 31 seconds Radiation Exposure Index: 666.31 microGray*m^2 PROCEDURE: After thorough discussion of risks and benefits of the procedure including bleeding, infection, injury to nerves, blood vessels, adjacent structures as well as headache and CSF leak, written and oral informed consent was obtained. Consent was obtained by Dr. Logan Bores. Time out form was completed. Patient was positioned prone on the fluoroscopy table. Local anesthesia was provided with 1% lidocaine without epinephrine after prepped and draped in the usual sterile fashion. Puncture was performed at L4-5 using a 6 inch 22-gauge spinal needle via a midline/left paramedian approach. Using a single pass through the dura, the needle was placed within the thecal sac, with return of clear CSF. 15 mL of Isovue M-200 was injected into the thecal sac, with normal opacification of the nerve roots and cauda equina consistent with free flow within the subarachnoid space. I personally performed the lumbar puncture and administered the intrathecal contrast. I also personally supervised acquisition of the myelogram images. TECHNIQUE: Contiguous axial images were obtained through the Lumbar spine after the intrathecal infusion of infusion. Coronal and sagittal reconstructions were obtained of the axial image sets. COMPARISON:  07/14/2017 FINDINGS: LUMBAR MYELOGRAM FINDINGS: Transitional lumbosacral anatomy is again noted with L5 being largely sacralized. There is trace retrolisthesis of T12 on L1 in the upright neutral and extension positions which reduces with flexion. There is evidence of stenosis at  L1-2 in the upright position, however detailed characterization  is limited by patient body habitus and paucity of intrathecal contrast at and above this level. Sequelae of L2-L5 fusion are identified with wide patency of the spinal canal at these levels. CT LUMBAR MYELOGRAM FINDINGS: Trace retrolisthesis of T12 on L1 is unchanged from the prior study. Sequelae of L2-L5 fusion are again identified with solid interbody and posterior element arthrodesis at each level. Pedicle screws are in place bilaterally from L2-L5 and appear well-positioned without evidence of loosening. No fracture or suspicious osseous lesion is identified. The conus medullaris terminates at the lower L1 level. The nerve roots of the cauda equina are unremarkable. Aortic atherosclerosis and retroperitoneal surgical clips are noted. T10-11: Mild disc space narrowing. Mild disc bulging and congenitally short pedicles result in borderline to mild spinal stenosis without significant neural foraminal stenosis. T11-12: Mild disc space narrowing. Disc bulging, endplate spurring, congenitally short pedicles, and moderate facet and ligamentum flavum hypertrophy result in mild spinal stenosis and mild to moderate left greater than right neural foraminal stenosis, unchanged. T12-L1: Vacuum disc and mild posterior disc space narrowing. Disc bulging, congenitally short pedicles, and moderate facet and ligamentum flavum hypertrophy result in mild spinal stenosis and moderate right and mild left neural foraminal stenosis, unchanged. L1-2: Disc bulging, congenitally short pedicles, and severe facet and ligamentum flavum hypertrophy result in moderate to severe spinal stenosis and mild right and mild-to-moderate left neural foraminal stenosis, unchanged. L2-3: Previous fusion. Widely patent spinal canal and left neural foramen. Unchanged mild osseous neural foraminal narrowing on the right. L3-4: Previous wide posterior decompression and fusion. Widely patent  spinal canal. Unchanged mild osseous neural foraminal narrowing bilaterally. L4-5: Previous wide posterior decompression and fusion. Widely patent spinal canal. Unchanged mild osseous neural foraminal narrowing bilaterally. L5-S1: Rudimentary disc.  No stenosis. IMPRESSION: 1. 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 2. Unchanged mild spinal stenosis and mild-to-moderate neural foraminal stenosis at T11-12 and T12-L1. 3. Solid L2-L5 fusion. Electronically Signed   By: Logan Bores M.D.   On: 09/01/2018 16:06   Mr Cervical Spine W/o Contrast  Result Date: 09/21/2018 CLINICAL DATA:  Neck pain. Bilateral shoulder and arm pain. Previous cervical spine surgery. EXAM: MRI CERVICAL SPINE WITHOUT CONTRAST TECHNIQUE: Multiplanar, multisequence MR imaging of the cervical spine was performed. No intravenous contrast was administered. COMPARISON:  September 04, 2017 cervical spine MRI FINDINGS: Alignment: Straightening of normal cervical lordosis. No spondylolisthesis. Vertebrae: Posterior spinal fusion hardware extends from C4-T2 with ACDF hardware at C7-T1 Cord: Normal signal and morphology. Posterior Fossa, vertebral arteries, paraspinal tissues: Negative. Disc levels: C1-C2: Normal. C2-C3: Normal disc space and facets. No spinal canal or neuroforaminal stenosis. C3-C4: Posterior fusion. No spinal canal or neural foraminal stenosis. C4-C5: Posterior fusion. No spinal canal or neural foraminal stenosis. C5-C6: Posterior fusion. Small central disc protrusion is unchanged. No spinal canal or neural foraminal stenosis. C6-C7: Laminectomy without spinal canal stenosis. C7-T1: Laminectomy without spinal canal stenosis. IMPRESSION: Unchanged postoperative appearance of the cervical spine with posterior fusion from C4-T2 and ACDF at C7-T1. No spinal canal stenosis or neural impingement. Electronically Signed   By: Ulyses Jarred M.D.   On:  09/21/2018 23:14   Dg Myelography Lumbar Inj Lumbosacral  Result Date: 09/01/2018 CLINICAL DATA:  Lumbar spinal stenosis. Right greater than left low back and leg pain. Prior lumbar fusion. EXAM: LUMBAR MYELOGRAM FLUOROSCOPY TIME:  Fluoroscopy Time: 31 seconds Radiation Exposure Index: 666.31 microGray*m^2 PROCEDURE: After thorough discussion of risks and benefits of the procedure  including bleeding, infection, injury to nerves, blood vessels, adjacent structures as well as headache and CSF leak, written and oral informed consent was obtained. Consent was obtained by Dr. Logan Bores. Time out form was completed. Patient was positioned prone on the fluoroscopy table. Local anesthesia was provided with 1% lidocaine without epinephrine after prepped and draped in the usual sterile fashion. Puncture was performed at L4-5 using a 6 inch 22-gauge spinal needle via a midline/left paramedian approach. Using a single pass through the dura, the needle was placed within the thecal sac, with return of clear CSF. 15 mL of Isovue M-200 was injected into the thecal sac, with normal opacification of the nerve roots and cauda equina consistent with free flow within the subarachnoid space. I personally performed the lumbar puncture and administered the intrathecal contrast. I also personally supervised acquisition of the myelogram images. TECHNIQUE: Contiguous axial images were obtained through the Lumbar spine after the intrathecal infusion of infusion. Coronal and sagittal reconstructions were obtained of the axial image sets. COMPARISON:  07/14/2017 FINDINGS: LUMBAR MYELOGRAM FINDINGS: Transitional lumbosacral anatomy is again noted with L5 being largely sacralized. There is trace retrolisthesis of T12 on L1 in the upright neutral and extension positions which reduces with flexion. There is evidence of stenosis at L1-2 in the upright position, however detailed characterization is limited by patient body habitus and paucity of  intrathecal contrast at and above this level. Sequelae of L2-L5 fusion are identified with wide patency of the spinal canal at these levels. CT LUMBAR MYELOGRAM FINDINGS: Trace retrolisthesis of T12 on L1 is unchanged from the prior study. Sequelae of L2-L5 fusion are again identified with solid interbody and posterior element arthrodesis at each level. Pedicle screws are in place bilaterally from L2-L5 and appear well-positioned without evidence of loosening. No fracture or suspicious osseous lesion is identified. The conus medullaris terminates at the lower L1 level. The nerve roots of the cauda equina are unremarkable. Aortic atherosclerosis and retroperitoneal surgical clips are noted. T10-11: Mild disc space narrowing. Mild disc bulging and congenitally short pedicles result in borderline to mild spinal stenosis without significant neural foraminal stenosis. T11-12: Mild disc space narrowing. Disc bulging, endplate spurring, congenitally short pedicles, and moderate facet and ligamentum flavum hypertrophy result in mild spinal stenosis and mild to moderate left greater than right neural foraminal stenosis, unchanged. T12-L1: Vacuum disc and mild posterior disc space narrowing. Disc bulging, congenitally short pedicles, and moderate facet and ligamentum flavum hypertrophy result in mild spinal stenosis and moderate right and mild left neural foraminal stenosis, unchanged. L1-2: Disc bulging, congenitally short pedicles, and severe facet and ligamentum flavum hypertrophy result in moderate to severe spinal stenosis and mild right and mild-to-moderate left neural foraminal stenosis, unchanged. L2-3: Previous fusion. Widely patent spinal canal and left neural foramen. Unchanged mild osseous neural foraminal narrowing on the right. L3-4: Previous wide posterior decompression and fusion. Widely patent spinal canal. Unchanged mild osseous neural foraminal narrowing bilaterally. L4-5: Previous wide posterior  decompression and fusion. Widely patent spinal canal. Unchanged mild osseous neural foraminal narrowing bilaterally. L5-S1: Rudimentary disc.  No stenosis. IMPRESSION: 1. 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 2. Unchanged mild spinal stenosis and mild-to-moderate neural foraminal stenosis at T11-12 and T12-L1. 3. Solid L2-L5 fusion. Electronically Signed   By: Logan Bores M.D.   On: 09/01/2018 16:06     [name], MD 09/24/2018, 9:40 PM PGY-1, [facility] Intern pager: [phone], text  pages welcome  [place]   I have seen and examined this patient.    I have discussed the findings and exam with the intern and agree with the above note, which I have edited appropriately in [place]. I helped develop the management plan that is described in the resident's note, and I agree with the content.   Marny Lowenstein, MD, MS FAMILY MEDICINE RESIDENT - PGY3 09/24/2018 10:49 PM

## 2018-09-24 NOTE — ED Triage Notes (Signed)
Pt called 911 for a chief complaint of chest pain. The pt stated it began this morning, went away, and she called when it came back. Pt does not have an IV at this time and was given 324 mg of aspirin by EMS. The pt complains of substernal chest pain at a 6. Pt is on 2 liters of oxygen prn at home. EMS stated the patient had a capillary blood glucose of 130 and that she had experienced a weight gain of 11 pounds followed by a weight loss of 7 pounds within the last 7 days.

## 2018-09-24 NOTE — ED Provider Notes (Signed)
[facility] EMERGENCY DEPARTMENT Provider Note   CSN: [id number] Arrival date & time: 09/24/18  1646     History   Chief Complaint Chief Complaint  Patient presents with  . Chest Pain    This morning had pain, went away, came back this afternoon     HPI [name] is a 53 y.o. female with past medical history significant for chronic diastolic heart failure, chronic hypoxic respiratory failure on 2L [place], COPD, obstructive sleep apnea, paroxysmal SVT, type 2 diabetes on insulin, CKD stage III, hypertension presents emergency department today with chief complaint of chest pain and shortness of breath.  She states the pain started this morning after she woke up, approximately 4 hours prior to arrival.  She states pain is located in the left side of her chest and she describes as an ache.  She rated the pain 6 out of 10 in severity.  She states the pain only lasted a couple of minutes and resolved without intervention.  Later in the day she said the pain returned, with her cardiologist who recommended ED evaluation. She does report she had similar episodes of chest pain 4 days ago.  She state she had left-sided chest aching that lasted for a minute.  Because it resolves without intervention she did not treatment at that time.  Also reporting worsening dyspnea throughout the day.  She is on 2 L nasal cannula for which she wears all the time at home however she does states she does not wear it when she goes out.  She has noticed left-sided swelling of upper and lower extremities.  Within the last week she admits to gaining 11 pounds and then losing 7 in the last few days.  She did not take any medications for symptoms prior to arrival.  She denies fever, chills, palpitations, abdominal pain, nausea, vomiting, urinary symptoms, diarrhea.  Review shows she had stress test in 11/15/2007 that showed LV EF >65%.  Past Medical History:  Diagnosis Date  . Anxiety   . Arthritis  04-10-11   hips, shoulders, back  . Asthma   . Bipolar disorder ([place])   . Cancer ([place]) 1993   cervical, no treatment done, went away per pt  . Cervical dysplasia or atypia 04-10-11   '93- once dx.-got pregnant-no intervention, then postpartum, no dysplasia found  . CHF (congestive heart failure) ([place])    no cardiologist 2014 dx, none now  . Chronic kidney disease    stage 3 kidney disease  . 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 Pathology showed Condyloma, gluteal CONDYLOMA ACUMINATUM.   [name] Kitchen COPD (chronic obstructive pulmonary disease) ([place])   . Diabetes mellitus without complication ([place])   . Dyspnea    with actity, sitting  . Fall 12/16/2016  . Fibrocystic breast disease   . GERD (gastroesophageal reflux disease)   . Hepatitis    hep B-count is low at present,doesn't register  . Hypercalcemia   . Hyperlipidemia   . Hypertension   . Lung nodule 11/2017   right and left lung  . Migraine headache    none recent  . Morbid obesity ([place]) 03/27/2006  . Neuropathy   . Pneumonia    walking pneu 15 yrs. ago  . Skin lesion 03/15/2011   In gluteal crease now s/p removal by Dr. [name] of General Surgery on 3/12. Path shows condyloma.     . Sleep apnea 04-10-11   uses cpap, pt does not know settings  .  Sleep apnea   . Trigger finger    left third    Patient Active Problem List   Diagnosis Date Noted  . Tobacco abuse 08/21/2018  . Right groin pain 07/20/2018  . Depressed mood 07/13/2018  . Mouth pain 06/02/2018  . Paroxysmal SVT (supraventricular tachycardia) ([place]) 03/20/2018  . Pure hypercholesterolemia 03/20/2018  . Muscle cramping 02/25/2018  . Chest pressure 02/20/2018  . Nodule of upper lobe of right lung 02/13/2018  . Falls, subsequent encounter 01/30/2018  . Open wound of left foot 11/04/2017  . Essential hypertension 10/27/2017  . Solitary pulmonary nodule 10/07/2017  . Acute on chronic right-sided congestive heart failure ([place])   .  Restrictive lung disease secondary to obesity 10/01/2017  . Polycythemia, secondary 10/01/2017  . Chronic viral hepatitis B without delta-agent ([place]) 07/08/2017  . COPD (chronic obstructive pulmonary disease) with chronic bronchitis ([place]) 06/27/2017  . Chronic kidney disease (CKD), stage IV (severe) ([place]) 01/14/2017  . Type 2 diabetes mellitus with stage 3 chronic kidney disease, with long-term current use of insulin ([place]) 12/12/2016  . Urge incontinence of urine 12/05/2016  . Trigger index finger of left hand 07/15/2016  . Back pain 04/07/2014  . Obesity 10/02/2012  . Chronic diastolic heart failure ([place]) 04/07/2012  . GERD 01/26/2010  . Hypertension 08/04/2008  . Obstructive sleep apnea treated with BiPAP 02/03/2008  . FIBROCYSTIC BREAST DISEASE 10/28/2006  . Hyperlipidemia 03/27/2006  . Obesity hypoventilation syndrome ([place]) 03/27/2006  . Former tobacco use 03/27/2006  . Depression with anxiety 03/27/2006    Past Surgical History:  Procedure Laterality Date  . ANAL FISTULECTOMY  04/17/2011   Procedure: FISTULECTOMY ANAL;  Surgeon: Imogene Burn. [name], MD;  Location: [facility];  Service: General;  Laterality: N/A;  Excision of Condyloma Gluteal Cleft   . BACK SURGERY  04-10-11   x5-Lumbar fusion-retained hardware.(Dr. Louanne Skye)  . BREAST CYST EXCISION     bilateral breast, 3 cysts removed from each breast  . BREAST EXCISIONAL BIOPSY Right    x 3  . BREAST EXCISIONAL BIOPSY Left    x 3  . CARPAL TUNNEL RELEASE Bilateral   . Cervical biospy    . COLONOSCOPY WITH PROPOFOL N/A 07/06/2015   Procedure: COLONOSCOPY WITH PROPOFOL;  Surgeon: [name], MD;  Location: [facility] ENDOSCOPY;  Service: Endoscopy;  Laterality: N/A;  . DOPPLER ECHOCARDIOGRAPHY  04/06/2012   AT [facility] 55-60%  . FRACTURE SURGERY    . I&D EXTREMITY Left 12/18/2016   Procedure: IRRIGATION AND DEBRIDEMENT LEFT FOOT, CLOSURE;  Surgeon: [name], MD;  Location: [facility];  Service: Orthopedics;  Laterality: Left;  [name] Kitchen  MULTIPLE TOOTH EXTRACTIONS    . NECK SURGERY  04-10-11   x3- cervical fusion with plating and screws-Dr. [name]  . TEE WITHOUT CARDIOVERSION N/A 04/06/2012   Procedure: TRANSESOPHAGEAL ECHOCARDIOGRAM (TEE);  Surgeon: [name], MD;  Location[facility] ENDOSCOPY;  Service: Cardiovascular;  Laterality: N/A;  . TRIGGER FINGER RELEASE Right    middle finger  . TRIGGER FINGER RELEASE Left 06/28/2016   Procedure: RELEASE TRIGGER FINGER LEFT 3RD FINGER;  Surgeon: [name], MD;  Location: [facility];  Service: Orthopedics;  Laterality: Left;  . TRIGGER FINGER RELEASE Right 06/12/2016   Procedure: RIGHT INDEX FINGER TRIGGER RELEASE;  Surgeon: [name], MD;  Location: [facility];  Service: Orthopedics;  Laterality: Right;  . TRIGGER FINGER RELEASE Right 01/19/2018   Procedure: RIGHT RING FINGER TRIGGER FINGER RELEASE;  Surgeon: [name], MD;  Location: [facility];  Service: Orthopedics;  Laterality: Right;     OB History   No obstetric history on file.      Home Medications    Prior to Admission medications   Medication Sig Start Date End Date Taking? Authorizing Provider  ACCU-CHEK SOFTCLIX LANCETS lancets TEST 4 TIMES DAILY 01/12/18  Yes [name], MD  Acetaminophen (TYLENOL EXTRA STRENGTH PO) Take 1,000 mg by mouth 2 (two) times a day.   Yes [provider]  ammonium lactate (AMLACTIN) 12 % lotion Apply 1 application topically 2 (two) times daily as needed for dry skin. 08/07/18  Yes [name], MD  atorvastatin (LIPITOR) 40 MG tablet Take 1 tablet (40 mg total) by mouth daily. 12/31/17  Yes [name], MD  baclofen (LIORESAL) 10 MG tablet Take 1 tablet (10 mg total) by mouth 3 (three) times daily as needed for muscle spasms. 03/24/18  Yes [name], MD  blood glucose meter kit and supplies KIT Dispense based on patient and insurance preference. Use up to four times daily as directed. (FOR ICD-9 250.00, 250.01). 12/20/16  Yes Bonnita Hollow, MD  busPIRone (BUSPAR) 10 MG  tablet TAKE 1 TABLET BY MOUTH THREE TIMES A DAY Patient taking differently: Take 10 mg by mouth 3 (three) times daily.  06/25/18  Yes Riccio, Levada Dy C, DO  Calcium Citrate-Vitamin D (CALCIUM CITRATE + D3 PO) Take 1 tablet by mouth daily.   Yes [provider]  Capsaicin-Menthol (PAIN RELIEF EX) Apply 1 application topically 3 (three) times daily as needed (shoulder pain).   Yes [provider]  CHANTIX 0.5 MG tablet TAKE 1 TABLET (0.5 MG TOTAL) BY MOUTH 2 (TWO) TIMES DAILY. Patient taking differently: Take 0.5 mg by mouth 2 (two) times daily.  09/09/18  Yes [name], MD  diclofenac sodium (VOLTAREN) 1 % GEL APPLY 2 GRAMS TO AFFECTED AREA 4 TIMES A DAY Patient taking differently: Apply 4 g topically 4 (four) times daily. Per pt: applies all over body 09/23/18  Yes [name], MD  diltiazem (CARDIZEM CD) 120 MG 24 hr capsule Take 1 capsule (120 mg total) by mouth 2 (two) times daily. 01/08/18 01/03/19 Yes [name], MD  docusate sodium (COLACE) 100 MG capsule Take 200 mg by mouth 2 (two) times daily.   Yes [provider]  empagliflozin (JARDIANCE) 25 MG TABS tablet Take 25 mg by mouth daily. 08/04/18 11/02/18 Yes [name], MD  febuxostat (ULORIC) 40 MG tablet Take 40 mg by mouth at bedtime.  05/27/18  Yes [provider]  Flaxseed, Linseed, (FLAXSEED OIL) 1000 MG CAPS Take 1,000 mg by mouth daily.   Yes [provider]  fluticasone (FLONASE) 50 MCG/ACT nasal spray Place 2 sprays into both nostrils 2 (two) times daily. 07/21/18  Yes [name], MD  Fluticasone-Umeclidin-Vilant (TRELEGY ELLIPTA) 100-62.5-25 MCG/INH AEPB Inhale 1 puff into the lungs daily. 08/18/18  Yes Icard, Octavio Graves, DO  folic acid (FOLVITE) 176 MCG tablet Take 400 mcg by mouth daily.   Yes [provider]  furosemide (LASIX) 40 MG tablet Take 2 tablets (80 mg total) by mouth 2 (two) times daily. 09/11/18  Yes [name], MD  gabapentin  (NEURONTIN) 300 MG capsule TAKE 2 CAPSULES (600 MG TOTAL) BY MOUTH 3 (THREE) TIMES DAILY. 08/24/18  Yes Jessy Oto, MD  glucose blood (ACCU-CHEK AVIVA PLUS) test strip TEST 4 TIMES DAILY Patient taking differently: 1 each by Other route 3 (three) times daily. TEST 3 TIMES DAILY 08/04/18  Yes [name],  MD  HYDROcodone-acetaminophen (NORCO) 7.5-325 MG tablet Take 1 tablet by mouth every 6 (six) hours as needed for moderate pain. 01/27/18  Yes [name], MD  hydrOXYzine (ATARAX/VISTARIL) 10 MG tablet TAKE 1 TABLET BY MOUTH EVERY DAY AS NEEDED Patient taking differently: Take 10 mg by mouth daily as needed for itching.  08/03/18  Yes [name], MD  Insulin Glargine (LANTUS SOLOSTAR) 100 UNIT/ML Solostar Pen Inject 22 Units into the skin daily before breakfast. Patient taking differently: Inject 20 Units into the skin daily before breakfast.  06/02/18  Yes Orson Eva J, DO  insulin lispro (HUMALOG KWIKPEN) 100 UNIT/ML KwikPen Inject 0.06 mLs (6 Units total) into the skin 3 (three) times daily before meals. 07/08/18  Yes [name], MD  Insulin Pen Needle (B-D UF III MINI PEN NEEDLES) 31G X 5 MM MISC CHECK SUGARS 4 TIMES A DAY BEFORE MEALS AND AT BEDTIME. 08/14/18  Yes [name], MD  ketoconazole (NIZORAL) 2 % cream APPLY 1 FINGERTIP AMOUNT TO EACH FOOT DAILY. Patient taking differently: Apply 1 application topically daily. Apply 1 fingertip amount to each foot daily. 07/20/18  Yes Gardiner Barefoot, DPM  loratadine (CLARITIN) 10 MG tablet Take 1 tablet (10 mg total) by mouth daily. 05/29/17  Yes [name], MD  losartan (COZAAR) 100 MG tablet Take 1 tablet (100 mg total) by mouth daily. 05/29/17  Yes [name], MD  metolazone (ZAROXOLYN) 2.5 MG tablet Take 1 tablet (2.5 mg total) by mouth 2 (two) times a week. TAKE 1 TABLET TWICE WEEKLY ON MONDAYS AND THURSDAYS ONLY 06/18/18  Yes [name], MD  montelukast (SINGULAIR) 10 MG tablet TAKE 1 TABLET AT BEDTIME Patient taking  differently: Take 10 mg by mouth at bedtime.  03/02/18  Yes [name], MD  Multiple Vitamins-Minerals (MULTIVITAMIN WITH MINERALS) tablet Take 1 tablet by mouth daily.   Yes [provider]  mupirocin ointment (BACTROBAN) 2 % APPLY TO AFFECTED AREA TWICE A DAY Patient taking differently: Apply 1 application topically 2 (two) times daily.  09/09/18  Yes [name], MD  NARCAN 4 MG/0.1ML LIQD nasal spray kit Place 1 spray into the nose once.  02/16/18  Yes [provider]  NON FORMULARY Uses a C-PAP at bedtime   Yes [provider]  nystatin (MYCOSTATIN) 100000 UNIT/ML suspension TAKE 5 MLS (500,000 UNITS TOTAL) BY MOUTH 4 (FOUR) TIMES DAILY. SWISH AND SWALLOW. 09/08/18  Yes [name], MD  Omega-3 Fatty Acids (FISH OIL) 1000 MG CAPS Take 1,000 mg by mouth daily.    Yes [provider]  omeprazole (PRILOSEC) 20 MG capsule Take 1 capsule (20 mg total) by mouth daily as needed. 02/04/18  Yes [name], MD  potassium chloride SA (KLOR-CON M20) 20 MEQ tablet Take 1 tablet (20 mEq total) by mouth 2 (two) times a week. Take 1 tablet twice weekly on Monday and Thursday. 05/18/18  Yes [name], MD  PROAIR HFA [phone] Base) MCG/ACT inhaler INHALE 2 PUFFS INTO THE LUNGS EVERY 6 (SIX) HOURS AS NEEDED FOR SHORTNESS OF BREATH. Patient taking differently: Inhale 2 puffs into the lungs every 6 (six) hours as needed for shortness of breath.  03/03/18  Yes [name], MD  promethazine (PHENERGAN) 25 MG tablet Take 1 tablet (25 mg total) by mouth every 6 (six) hours as needed for nausea. 01/19/18  Yes [name], MD  sertraline (ZOLOFT) 25 MG tablet TAKE 1 TABLET BY MOUTH EVERY DAY Patient taking differently: Take 25 mg by mouth daily.  08/03/18  Yes [name], MD  TOVIAZ 8 MG TB24 tablet Take 8 mg by mouth daily. 08/31/18  Yes [provider]  Vitamin D, Ergocalciferol, (DRISDOL) 50000 units CAPS capsule Take 50,000 Units by mouth daily. For 30  days   Yes [provider]  hydrOXYzine (VISTARIL) 100 MG capsule Take 1 capsule (100 mg total) by mouth 3 (three) times daily as needed for itching. Patient not taking: Reported on 09/24/2018 04/22/18   [name], MD  oxybutynin (DITROPAN XL) 10 MG 24 hr tablet Take 1 tablet (10 mg total) by mouth at bedtime. 10/07/17   Martyn Malay, MD    Family History Family History  Problem Relation Age of Onset  . Pulmonary embolism Mother   . Stroke Father   . Alcohol abuse Father   . Pneumonia Father   . Cancer Maternal Aunt        breast  . Breast cancer Maternal Aunt   . Asthma Other   . COPD Other   . Hypertension Other   . Diabetes Other   . Asthma Sister   . Depression Sister   . Arthritis Sister   . Sickle cell anemia Son     Social History Social History   Tobacco Use  . Smoking status: Current Some Day Smoker    Packs/day: 0.25    Years: 40.00    Pack years: 10.00    Types: Cigarettes    Start date: 01/28/1977    Last attempt to quit: 09/07/2017    Years since quitting: 1.0  . Smokeless tobacco: Never Used  . Tobacco comment: Quit prior to hospitalization using low dose Chantix  Substance Use Topics  . Alcohol use: No    Alcohol/week: 0.0 standard drinks  . Drug use: Not Currently    Types: Marijuana, "Crack" cocaine    Comment: hx marijuana usemany years ago, Crack -none since 11/2015     Allergies   Aspirin, Chantix [varenicline tartrate], Methadone hcl, Corticosteroids, and Levofloxacin   Review of Systems Review of Systems  Constitutional: Negative for chills and fever.  HENT: Negative for congestion, ear discharge, ear pain, sinus pressure, sinus pain and sore throat.   Eyes: Negative for pain and redness.  Respiratory: Positive for shortness of breath. Negative for cough.   Cardiovascular: Positive for chest pain and leg swelling.  Gastrointestinal: Negative for abdominal pain, constipation, diarrhea, nausea and vomiting.  Genitourinary:  Negative for dysuria and hematuria.  Musculoskeletal: Negative for back pain and neck pain.  Skin: Negative for wound.  Neurological: Negative for weakness, numbness and headaches.     Physical Exam Updated Vital Signs BP 129/79 (BP Location: Left Arm)   Pulse 90   Temp 98.7 F (37.1 C) (Oral)   Resp 19   Ht '5\' 10"'  (1.778 m)   Wt (!) 174.6 kg   LMP 04/10/2006   SpO2 91%   BMI 55.24 kg/m   Physical Exam Vitals signs and nursing note reviewed.  Constitutional:      General: She is not in acute distress.    Appearance: She is obese. She is not ill-appearing.  HENT:     Head: Normocephalic and atraumatic.     Right Ear: Tympanic membrane and external ear normal.     Left Ear: Tympanic membrane and external ear normal.     Nose: Nose normal.     Mouth/Throat:     Mouth: Mucous membranes are moist.     Pharynx: Oropharynx is clear.  Eyes:  General: No scleral icterus.       Right eye: No discharge.        Left eye: No discharge.     Extraocular Movements: Extraocular movements intact.     Conjunctiva/sclera: Conjunctivae normal.     Pupils: Pupils are equal, round, and reactive to light.  Neck:     Musculoskeletal: Normal range of motion.     Vascular: No JVD.  Cardiovascular:     Rate and Rhythm: Normal rate and regular rhythm.     Pulses: Normal pulses.          Radial pulses are 2+ on the right side and 2+ on the left side.     Heart sounds: Normal heart sounds.  Pulmonary:     Effort: Tachypnea present.     Comments: Lung sounds are diminished throughout. She is talking in short sentences, no accessory muscle use.Pt is wearing 3L  [place] with SpO2 of 95%.   Abdominal:     Comments: Abdomen is soft, non-distended, and non-tender in all quadrants. No rigidity, no guarding. No peritoneal signs.  Musculoskeletal: Normal range of motion.     Right lower leg: Edema present.     Left lower leg: Edema present.  Skin:    General: Skin is warm and dry.     Capillary  Refill: Capillary refill takes less than 2 seconds.  Neurological:     Mental Status: She is oriented to person, place, and time.     GCS: GCS eye subscore is 4. GCS verbal subscore is 5. GCS motor subscore is 6.     Comments: Fluent speech, no facial droop.  Psychiatric:        Behavior: Behavior normal.      ED Treatments / Results  Labs (all labs ordered are listed, but only abnormal results are displayed) Labs Reviewed  BASIC METABOLIC PANEL - Abnormal; Notable for the following components:      Result Value   Chloride 95 (*)    Glucose, Bld 173 (*)    BUN 32 (*)    Creatinine, Ser 1.61 (*)    GFR calc non Af Amer 36 (*)    GFR calc Af Amer 41 (*)    Anion gap 17 (*)    All other components within normal limits  CBC WITH DIFFERENTIAL/PLATELET - Abnormal; Notable for the following components:   WBC 11.6 (*)    RBC 6.11 (*)    Hemoglobin 16.7 (*)    HCT 48.3 (*)    MCV 79.1 (*)    RDW 17.3 (*)    All other components within normal limits  URINALYSIS, ROUTINE W REFLEX MICROSCOPIC - Abnormal; Notable for the following components:   Color, Urine STRAW (*)    Glucose, UA >=500 (*)    Hgb urine dipstick SMALL (*)    Bacteria, UA RARE (*)    All other components within normal limits  BRAIN NATRIURETIC PEPTIDE  TROPONIN I (HIGH SENSITIVITY)  TROPONIN I (HIGH SENSITIVITY)    EKG EKG Interpretation  Date/Time:  Thursday September 24 2018 16:54:56 EDT Ventricular Rate:  90 PR Interval:    QRS Duration: 112 QT Interval:  397 QTC Calculation: 486 R Axis:   68 Text Interpretation:  Sinus rhythm Atrial premature complex Incomplete left bundle branch block Borderline prolonged QT interval Confirmed by Lajean Saver [phone]) on 09/24/2018 5:25:40 PM   Radiology Dg Chest 2 View  Result Date: 09/24/2018 CLINICAL DATA:  Chest pain and shortness of breath.  EXAM: CHEST - 2 VIEW COMPARISON:  01/27/2018 FINDINGS: Heart size and pulmonary vascularity are normal. No infiltrates or  effusions. No acute bone abnormality. IMPRESSION: No active cardiopulmonary disease. Electronically Signed   By: Lorriane Shire M.D.   On: 09/24/2018 18:14    Procedures Procedures (including critical care time)  Medications Ordered in ED Medications - No data to display   Initial Impression / Assessment and Plan / ED Course  I have reviewed the triage vital signs and the nursing notes.  Pertinent labs & imaging results that were available during my care of the patient were reviewed by me and considered in my medical decision making (see chart for details).  53 year old female presents with chest pain and shortness of breath.  On arrival she is afebrile and tachypneic.  In triage she was noted to have SPO2 of 91% on her home regiment 2 L nasal cannula.  With increasing it to 3 L SPO2 slightly improved to 94%. Lung sounds are diminished throughout, but also difficult to hear with body habitus. She has bilateral lower extremity edema. Labs today show nonspecific leukocytosis of 11.6, BNP is 41.7. UA not suggestive of signs of infection. Chest xray viewed by me is without infiltrate, no pulmonary congestion. EKG without ischemic changes. This case was discussed with Dr. Ashok Cordia who has seen the patient and agrees with plan to admit.  Discussed case with cardiologist Dr. Radford Pax who recommends medical admission for further work up of chest pain and shortness of breath. Plan for unassigned admission.  Spoke with Dr. Caron Presume with family medicine service who agrees to assume care of patient and bring into the hospital for further evaluation and management.    This note was prepared using Dragon voice recognition software and may include unintentional dictation errors due to the inherent limitations of voice recognition software.   Final Clinical Impressions(s) / ED Diagnoses   Final diagnoses:  Chest pain, unspecified type    ED Discharge Orders    None       Cherre Robins,  PA-C 09/25/18 [id number]    Lajean Saver, MD 09/25/18 [phone]

## 2018-09-24 NOTE — Progress Notes (Signed)
PHARMACIST - PHYSICIAN ORDER COMMUNICATION  CONCERNING: P&T Medication Policy on Herbal Medications  DESCRIPTION:  This patient's order for:  Flaxseed Oil  has been noted.  This product(s) is classified as an "herbal" or natural product. Due to a lack of definitive safety studies or FDA approval, nonstandard manufacturing practices, plus the potential risk of unknown drug-drug interactions while on inpatient medications, the Pharmacy and Therapeutics Committee does not permit the use of "herbal" or natural products of this type within [facility].   ACTION TAKEN: The pharmacy department is unable to verify this order at this time and your patient has been informed of this safety policy. Please reevaluate patient's clinical condition at discharge and address if the herbal or natural product(s) should be resumed at that time.   

## 2018-09-25 ENCOUNTER — Encounter (HOSPITAL_COMMUNITY): Payer: Self-pay | Admitting: *Deleted

## 2018-09-25 DIAGNOSIS — I5032 Chronic diastolic (congestive) heart failure: Secondary | ICD-10-CM

## 2018-09-25 DIAGNOSIS — R072 Precordial pain: Secondary | ICD-10-CM | POA: Diagnosis not present

## 2018-09-25 DIAGNOSIS — R0789 Other chest pain: Secondary | ICD-10-CM | POA: Diagnosis not present

## 2018-09-25 DIAGNOSIS — R079 Chest pain, unspecified: Secondary | ICD-10-CM | POA: Diagnosis not present

## 2018-09-25 LAB — GLUCOSE, CAPILLARY
Glucose-Capillary: 148 mg/dL — ABNORMAL HIGH (ref 70–99)
Glucose-Capillary: 136 mg/dL — ABNORMAL HIGH (ref 70–99)

## 2018-09-25 LAB — BASIC METABOLIC PANEL
Anion gap: 14 (ref 5–15)
BUN: 27 mg/dL — ABNORMAL HIGH (ref 6–20)
CO2: 27 mmol/L (ref 22–32)
Calcium: 9 mg/dL (ref 8.9–10.3)
Chloride: 97 mmol/L — ABNORMAL LOW (ref 98–111)
Creatinine, Ser: 1.51 mg/dL — ABNORMAL HIGH (ref 0.44–1.00)
GFR calc Af Amer: 45 mL/min — ABNORMAL LOW (ref >60)
GFR calc non Af Amer: 39 mL/min — ABNORMAL LOW (ref >60)
Glucose, Bld: 182 mg/dL — ABNORMAL HIGH (ref 70–99)
Potassium: 3.3 mmol/L — ABNORMAL LOW (ref 3.5–5.1)
Sodium: 138 mmol/L (ref 135–145)

## 2018-09-25 LAB — CREATININE, SERUM
Creatinine, Ser: 1.6 mg/dL — ABNORMAL HIGH (ref 0.44–1.00)
GFR calc Af Amer: 42 mL/min — ABNORMAL LOW (ref >60)
GFR calc non Af Amer: 36 mL/min — ABNORMAL LOW (ref >60)

## 2018-09-25 LAB — LIPID PANEL
Cholesterol: 146 mg/dL (ref 0–200)
HDL: 37 mg/dL — ABNORMAL LOW (ref >40)
LDL Cholesterol: 76 mg/dL (ref 0–99)
Total CHOL/HDL Ratio: 3.9 RATIO
Triglycerides: 165 mg/dL — ABNORMAL HIGH (ref <150)
VLDL: 33 mg/dL (ref 0–40)

## 2018-09-25 LAB — TROPONIN I (HIGH SENSITIVITY): Troponin I (High Sensitivity): 3 ng/L (ref <18)

## 2018-09-25 MED ORDER — FLUTICASONE FUROATE-VILANTEROL 100-25 MCG/INH IN AEPB
1.0000 | INHALATION_SPRAY | Freq: Every day | RESPIRATORY_TRACT | Status: DC
  Administered 2018-09-25: 1 via RESPIRATORY_TRACT
  Filled 2018-09-25: qty 28

## 2018-09-25 MED ORDER — ALBUTEROL SULFATE (2.5 MG/3ML) 0.083% IN NEBU: 2.5000 mg | INHALATION_SOLUTION | Freq: Four times a day (QID) | RESPIRATORY_TRACT | Status: DC | PRN

## 2018-09-25 MED ORDER — OXYBUTYNIN CHLORIDE ER 10 MG PO TB24
10.0000 mg | ORAL_TABLET | Freq: Every day | ORAL | Status: DC
  Administered 2018-09-25: 10 mg via ORAL
  Filled 2018-09-25 (×2): qty 1

## 2018-09-25 MED ORDER — UMECLIDINIUM BROMIDE 62.5 MCG/INH IN AEPB
1.0000 | INHALATION_SPRAY | Freq: Every day | RESPIRATORY_TRACT | Status: DC
  Administered 2018-09-25: 1 via RESPIRATORY_TRACT
  Filled 2018-09-25: qty 7

## 2018-09-25 NOTE — ED Notes (Signed)
ED TO INPATIENT HANDOFF REPORT  ED Nurse Name and Phone #:   Lenice Pressman [phone]  S Name/Age/Gender [name] 53 y.o. female Room/Bed: 036C/036C  Code Status   Code Status: Full Code  Home/SNF/Other Home Patient oriented to: self, place, time and situation Is this baseline? Yes   Triage Complete: Triage complete  Chief Complaint CP  Triage Note Pt called 911 for a chief complaint of chest pain. The pt stated it began this morning, went away, and she called when it came back. Pt does not have an IV at this time and was given 324 mg of aspirin by EMS. The pt complains of substernal chest pain at a 6. Pt is on 2 liters of oxygen prn at home. EMS stated the patient had a capillary blood glucose of 130 and that she had experienced a weight gain of 11 pounds followed by a weight loss of 7 pounds within the last 7 days.   Allergies Allergies  Allergen Reactions  . Aspirin Other (See Comments)    Kidney disease  . Chantix [Varenicline Tartrate] Other (See Comments)    On the 1 mg dose, patient experienced hallucintation  . 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 Corticosteroids Other (See Comments)    Hyperglycemia   . Levofloxacin Itching    Level of Care/Admitting Diagnosis ED Disposition    ED Disposition Condition Comment   Admit  Hospital Area: [facility] [[id number]]  Level of Care: Telemetry Cardiac [103]  I expect the patient will be discharged within 24 hours: No (not a candidate for 5C-Observation unit)  Covid Evaluation: Confirmed COVID Negative  Diagnosis: Chest pain [295621]  Admitting Physician: [name] [[id number]]  Attending Physician: Madison Hickman A [[id number]]  PT Class (Do Not Modify): Observation [104]  PT Acc Code (Do Not Modify): Observation [10022]       B Medical/Surgery History Past Medical History:  Diagnosis Date  . Anxiety   . Arthritis 04-10-11   hips, shoulders, back  . Asthma   .  Bipolar disorder ([place])   . Cancer ([place]) 1993   cervical, no treatment done, went away per pt  . Cervical dysplasia or atypia 04-10-11   '93- once dx.-got pregnant-no intervention, then postpartum, no dysplasia found  . CHF (congestive heart failure) ([place])    no cardiologist 2014 dx, none now  . Chronic kidney disease    stage 3 kidney disease  . 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 Pathology showed Condyloma, gluteal CONDYLOMA ACUMINATUM.   [name] Kitchen COPD (chronic obstructive pulmonary disease) ([place])   . Diabetes mellitus without complication ([place])   . Dyspnea    with actity, sitting  . Fall 12/16/2016  . Fibrocystic breast disease   . GERD (gastroesophageal reflux disease)   . Hepatitis    hep B-count is low at present,doesn't register  . Hypercalcemia   . Hyperlipidemia   . Hypertension   . Lung nodule 11/2017   right and left lung  . Migraine headache    none recent  . Morbid obesity ([place]) 03/27/2006  . Neuropathy   . Pneumonia    walking pneu 15 yrs. ago  . Skin lesion 03/15/2011   In gluteal crease now s/p removal by Dr. [name] of General Surgery on 3/12. Path shows condyloma.     . Sleep apnea 04-10-11   uses cpap, pt does not know settings  . Sleep apnea   .  Trigger finger    left third   Past Surgical History:  Procedure Laterality Date  . ANAL FISTULECTOMY  04/17/2011   Procedure: FISTULECTOMY ANAL;  Surgeon: Imogene Burn. [name], MD;  Location: [facility];  Service: General;  Laterality: N/A;  Excision of Condyloma Gluteal Cleft   . BACK SURGERY  04-10-11   x5-Lumbar fusion-retained hardware.(Dr. Louanne Skye)  . BREAST CYST EXCISION     bilateral breast, 3 cysts removed from each breast  . BREAST EXCISIONAL BIOPSY Right    x 3  . BREAST EXCISIONAL BIOPSY Left    x 3  . CARPAL TUNNEL RELEASE Bilateral   . Cervical biospy    . COLONOSCOPY WITH PROPOFOL N/A 07/06/2015   Procedure: COLONOSCOPY WITH PROPOFOL;  Surgeon: [name], MD;  Location: [facility];   Service: Endoscopy;  Laterality: N/A;  . DOPPLER ECHOCARDIOGRAPHY  04/06/2012   AT [facility] 55-60%  . FRACTURE SURGERY    . I&D EXTREMITY Left 12/18/2016   Procedure: IRRIGATION AND DEBRIDEMENT LEFT FOOT, CLOSURE;  Surgeon: [name], MD;  Location: [facility];  Service: Orthopedics;  Laterality: Left;  [name] Kitchen MULTIPLE TOOTH EXTRACTIONS    . NECK SURGERY  04-10-11   x3- cervical fusion with plating and screws-Dr. [name]  . TEE WITHOUT CARDIOVERSION N/A 04/06/2012   Procedure: TRANSESOPHAGEAL ECHOCARDIOGRAM (TEE);  Surgeon: [name], MD;  Location: [facility] ENDOSCOPY;  Service: Cardiovascular;  Laterality: N/A;  . TRIGGER FINGER RELEASE Right    middle finger  . TRIGGER FINGER RELEASE Left 06/28/2016   Procedure: RELEASE TRIGGER FINGER LEFT 3RD FINGER;  Surgeon: [name], MD;  Location: [facility];  Service: Orthopedics;  Laterality: Left;  . TRIGGER FINGER RELEASE Right 06/12/2016   Procedure: RIGHT INDEX FINGER TRIGGER RELEASE;  Surgeon: [name], MD;  Location: [facility];  Service: Orthopedics;  Laterality: Right;  . TRIGGER FINGER RELEASE Right 01/19/2018   Procedure: RIGHT RING FINGER TRIGGER FINGER RELEASE;  Surgeon: [name], MD;  Location: ;  Service: Orthopedics;  Laterality: Right;     A IV Location/Drains/Wounds Patient Lines/Drains/Airways Status   Active Line/Drains/Airways    Name:   Placement date:   Placement time:   Site:   Days:   Peripheral IV 09/24/18 Anterior;Proximal;Right Forearm   09/24/18    1813    Forearm   1   Incision (Closed) 01/19/18 Finger (Comment which one) Right   01/19/18    1022     249          Intake/Output Last 24 hours No intake or output data in the 24 hours ending 09/25/18 0059  Labs/Imaging Results for orders placed or performed during the hospital encounter of 09/24/18 (from the past 48 hour(s))  Brain natriuretic peptide     Status: None   Collection Time: 09/24/18  5:27 PM  Result Value Ref Range   B Natriuretic Peptide  41.7 0.0 - 100.0 pg/mL    Comment: Performed at [facility], [place] [address] [place], [place] [address]  Basic metabolic panel     Status: Abnormal   Collection Time: 09/24/18  6:21 PM  Result Value Ref Range   Sodium 137 135 - 145 mmol/L   Potassium 3.9 3.5 - 5.1 mmol/L   Chloride 95 (L) 98 - 111 mmol/L   CO2 25 22 - 32 mmol/L   Glucose, Bld 173 (H) 70 - 99 mg/dL   BUN 32 (H) 6 - 20 mg/dL   Creatinine, Ser  1.61 (H) 0.44 - 1.00 mg/dL   Calcium 9.2 8.9 - 10.3 mg/dL   GFR calc non Af Amer 36 (L) >60 mL/min   GFR calc Af Amer 41 (L) >60 mL/min   Anion gap 17 (H) 5 - 15    Comment: Performed at [facility] [address], [place], [place] [address]  CBC with Differential     Status: Abnormal   Collection Time: 09/24/18  6:21 PM  Result Value Ref Range   WBC 11.6 (H) 4.0 - 10.5 K/uL   RBC 6.11 (H) 3.87 - 5.11 MIL/uL   Hemoglobin 16.7 (H) 12.0 - 15.0 g/dL   HCT 48.3 (H) 36.0 - 46.0 %   MCV 79.1 (L) 80.0 - 100.0 fL   MCH 27.3 26.0 - 34.0 pg   MCHC 34.6 30.0 - 36.0 g/dL   RDW 17.3 (H) 11.5 - 15.5 %   Platelets 294 150 - 400 K/uL   nRBC 0.0 0.0 - 0.2 %   Neutrophils Relative % 60 %   Neutro Abs 7.1 1.7 - 7.7 K/uL   Lymphocytes Relative 27 %   Lymphs Abs 3.1 0.7 - 4.0 K/uL   Monocytes Relative 9 %   Monocytes Absolute 1.0 0.1 - 1.0 K/uL   Eosinophils Relative 3 %   Eosinophils Absolute 0.3 0.0 - 0.5 K/uL   Basophils Relative 1 %   Basophils Absolute 0.1 0.0 - 0.1 K/uL   Immature Granulocytes 0 %   Abs Immature Granulocytes 0.03 0.00 - 0.07 K/uL    Comment: Performed at [facility], [place] [address], [place], [place] [address]  Troponin I (High Sensitivity)     Status: None   Collection Time: 09/24/18  6:21 PM  Result Value Ref Range   Troponin I (High Sensitivity) 6 <18 ng/L    Comment: (NOTE) Elevated high sensitivity troponin I (hsTnI) values and significant  changes across serial measurements may suggest ACS but many other  chronic and acute conditions  are known to elevate hsTnI results.  Refer to the "Links" section for chest pain algorithms and additional  guidance. Performed at [facility], [place] [address] [place], [place] [address]   Urinalysis, Routine w reflex microscopic     Status: Abnormal   Collection Time: 09/24/18  6:30 PM  Result Value Ref Range   Color, Urine STRAW (A) YELLOW   APPearance CLEAR CLEAR   Specific Gravity, Urine 1.007 1.005 - 1.030   pH 6.0 5.0 - 8.0   Glucose, UA >=500 (A) NEGATIVE mg/dL   Hgb urine dipstick SMALL (A) NEGATIVE   Bilirubin Urine NEGATIVE NEGATIVE   Ketones, ur NEGATIVE NEGATIVE mg/dL   Protein, ur NEGATIVE NEGATIVE mg/dL   Nitrite NEGATIVE NEGATIVE   Leukocytes,Ua NEGATIVE NEGATIVE   RBC / HPF 6-10 0 - 5 RBC/hpf   WBC, UA 0-5 0 - 5 WBC/hpf   Bacteria, UA RARE (A) NONE SEEN   Squamous Epithelial / LPF 0-5 0 - 5    Comment: Performed at [facility], [address] [place], [place] [address]  SARS Coronavirus 2 [facility] order, Performed in [facility] hospital lab) Nasopharyngeal Nasopharyngeal Swab     Status: None   Collection Time: 09/24/18 10:10 PM   Specimen: Nasopharyngeal Swab  Result Value Ref Range   SARS Coronavirus 2 NEGATIVE NEGATIVE    Comment: (NOTE) If result is NEGATIVE SARS-CoV-2 target nucleic acids are NOT DETECTED. The SARS-CoV-2 RNA is generally detectable in upper and  lower  respiratory specimens during the acute phase of infection. The lowest  concentration of SARS-CoV-2 viral copies this assay can detect is 250  copies / mL. A negative result does not preclude SARS-CoV-2 infection  and should not be used as the sole basis for treatment or other  patient management decisions.  A negative result may occur with  improper specimen collection / handling, submission of specimen other  than nasopharyngeal swab, presence of viral mutation(s) within the  areas targeted by this assay, and inadequate number of viral copies  (<250 copies / mL). A  negative result must be combined with clinical  observations, patient history, and epidemiological information. If result is POSITIVE SARS-CoV-2 target nucleic acids are DETECTED. The SARS-CoV-2 RNA is generally detectable in upper and lower  respiratory specimens dur ing the acute phase of infection.  Positive  results are indicative of active infection with SARS-CoV-2.  Clinical  correlation with patient history and other diagnostic information is  necessary to determine patient infection status.  Positive results do  not rule out bacterial infection or co-infection with other viruses. If result is PRESUMPTIVE POSTIVE SARS-CoV-2 nucleic acids MAY BE PRESENT.   A presumptive positive result was obtained on the submitted specimen  and confirmed on repeat testing.  While 2019 novel coronavirus  (SARS-CoV-2) nucleic acids may be present in the submitted sample  additional confirmatory testing may be necessary for epidemiological  and / or clinical management purposes  to differentiate between  SARS-CoV-2 and other Sarbecovirus currently known to infect humans.  If clinically indicated additional testing with an alternate test  methodology [id number] is advised. The SARS-CoV-2 RNA is generally  detectable in upper and lower respiratory sp ecimens during the acute  phase of infection. The expected result is Negative. Fact Sheet for Patients:  StrictlyIdeas.no Fact Sheet for Healthcare Providers: BankingDealers.co.za This test is not yet approved or cleared by the [place] FDA and has been authorized for detection and/or diagnosis of SARS-CoV-2 by FDA under an Emergency Use Authorization (EUA).  This EUA will remain in effect (meaning this test can be used) for the duration of the COVID-19 declaration under Section 564(b)(1) of the Act, 21 U.S.C. section 360bbb-3(b)(1), unless the authorization is terminated or revoked sooner. Performed  at [facility], [place] [address] [place], [place] [address]   CBC     Status: Abnormal   Collection Time: 09/24/18 11:36 PM  Result Value Ref Range   WBC 10.4 4.0 - 10.5 K/uL   RBC 5.91 (H) 3.87 - 5.11 MIL/uL   Hemoglobin 16.0 (H) 12.0 - 15.0 g/dL   HCT 46.7 (H) 36.0 - 46.0 %   MCV 79.0 (L) 80.0 - 100.0 fL   MCH 27.1 26.0 - 34.0 pg   MCHC 34.3 30.0 - 36.0 g/dL   RDW 16.6 (H) 11.5 - 15.5 %   Platelets 282 150 - 400 K/uL   nRBC 0.0 0.0 - 0.2 %    Comment: Performed at [facility], [place] [address], [place], [place] [address]  Creatinine, serum     Status: Abnormal   Collection Time: 09/24/18 11:36 PM  Result Value Ref Range   Creatinine, Ser 1.60 (H) 0.44 - 1.00 mg/dL   GFR calc non Af Amer 36 (L) >60 mL/min   GFR calc Af Amer 42 (L) >60 mL/min    Comment: Performed at [facility] [address], [place], [place] [address]  Lipid panel     Status:  Abnormal   Collection Time: 09/24/18 11:36 PM  Result Value Ref Range   Cholesterol 146 0 - 200 mg/dL   Triglycerides 165 (H) <150 mg/dL   HDL 37 (L) >40 mg/dL   Total CHOL/HDL Ratio 3.9 RATIO   VLDL 33 0 - 40 mg/dL   LDL Cholesterol 76 0 - 99 mg/dL    Comment:        Total Cholesterol/HDL:CHD Risk Coronary Heart Disease Risk Table                     Men   Women  1/2 Average Risk   3.4   3.3  Average Risk       5.0   4.4  2 X Average Risk   9.6   7.1  3 X Average Risk  23.4   11.0        Use the calculated Patient Ratio above and the CHD Risk Table to determine the patient's CHD Risk.        ATP III CLASSIFICATION (LDL):  <100     mg/dL   Optimal  100-129  mg/dL   Near or Above                    Optimal  130-159  mg/dL   Borderline  160-189  mg/dL   High  >190     mg/dL   Very High Performed at [facility] [address], [place], [place],  [address]    *Note: Due to a large number of results and/or encounters for the requested time period, some results have not been displayed. A complete set  of results can be found in Results Review.   Dg Chest 2 View  Result Date: 09/24/2018 CLINICAL DATA:  Chest pain and shortness of breath. EXAM: CHEST - 2 VIEW COMPARISON:  01/27/2018 FINDINGS: Heart size and pulmonary vascularity are normal. No infiltrates or effusions. No acute bone abnormality. IMPRESSION: No active cardiopulmonary disease. Electronically Signed   By: Lorriane Shire M.D.   On: 09/24/2018 18:14    Pending Labs Unresulted Labs (From admission, onward)    Start     Ordered   10/01/18 0500  Creatinine, serum  (enoxaparin (LOVENOX)    CrCl >/= 30 ml/min)  Weekly,   R    Comments: while on enoxaparin therapy    09/24/18 2302          Vitals/Pain Today's Vitals   09/24/18 2100 09/24/18 2130 09/25/18 0039 09/25/18 0058  BP: 115/78 121/65  127/67  Pulse: 81 78  87  Resp: (!) 21 12  20   Temp:      TempSrc:      SpO2: 97% 95%  94%  Weight:      Height:      PainSc:   0-No pain 0-No pain    Isolation Precautions No active isolations  Medications Medications  atorvastatin (LIPITOR) tablet 40 mg (has no administration in time range)  diltiazem (CARDIZEM CD) 24 hr capsule 120 mg (has no administration in time range)  furosemide (LASIX) tablet 80 mg (has no administration in time range)  losartan (COZAAR) tablet 100 mg (has no administration in time range)  metolazone (ZAROXOLYN) tablet 2.5 mg (has no administration in time range)  busPIRone (BUSPAR) tablet 10 mg (10 mg Oral Given 09/25/18 0055)  varenicline (CHANTIX) tablet 0.5 mg (has no administration in time range)  hydrOXYzine (ATARAX/VISTARIL) tablet 10 mg (has no administration in time range)  sertraline (ZOLOFT) tablet 25 mg (has no administration in time range)  canagliflozin (INVOKANA) tablet 100 mg (has no administration in time range)  Insulin Glargine (LANTUS) Solostar Pen 10 Units (has no administration in time range)  docusate sodium (COLACE) capsule 200 mg (has no administration in time range)   pantoprazole (PROTONIX) EC tablet 40 mg (has no administration in time range)  fesoterodine (TOVIAZ) tablet 8 mg (has no administration in time range)  folic acid (FOLVITE) tablet 1 mg (has no administration in time range)  baclofen (LIORESAL) tablet 10 mg (has no administration in time range)  gabapentin (NEURONTIN) capsule 600 mg (600 mg Oral Given 09/25/18 0055)  Calcium Citrate-Vitamin D 250-200 MG-UNIT TABS (has no administration in time range)  multivitamin with minerals tablet 1 tablet (has no administration in time range)  omega-3 acid ethyl esters (LOVAZA) capsule 1 g (has no administration in time range)  fluticasone (FLONASE) 50 MCG/ACT nasal spray 2 spray (has no administration in time range)  Fluticasone-Umeclidin-Vilant 100-62.5-25 MCG/INH AEPB 1 puff (has no administration in time range)  loratadine (CLARITIN) tablet 10 mg (has no administration in time range)  montelukast (SINGULAIR) tablet 10 mg (has no administration in time range)  albuterol (VENTOLIN HFA) 108 (90 Base) MCG/ACT inhaler 2 puff (has no administration in time range)  promethazine (PHENERGAN) tablet 25 mg (has no administration in time range)  ammonium lactate (LAC-HYDRIN) 12 % lotion 1 application (has no administration in time range)  diclofenac sodium (VOLTAREN) 1 % transdermal gel 4 g (has no administration in time range)  ketoconazole (NIZORAL) 2 % cream 1 application (has no administration in time range)  HYDROcodone-acetaminophen (NORCO) 7.5-325 MG per tablet 1 tablet (has no administration in time range)  acetaminophen (TYLENOL) tablet 1,000 mg (has no administration in time range)  enoxaparin (LOVENOX) injection 80 mg (has no administration in time range)  insulin aspart (novoLOG) injection 0-9 Units (has no administration in time range)    Mobility walks Low fall risk   Focused Assessments Cardiac Assessment Handoff:  Cardiac Rhythm: Normal sinus rhythm Lab Results  Component Value Date    TROPONINI <0.03 01/27/2018   No results found for: DDIMER Does the Patient currently have chest pain? No     R Recommendations: See Admitting Provider Note  Report given to:   Additional Notes: .

## 2018-09-25 NOTE — Consult Note (Addendum)
Cardiology Consultation:   Patient ID: SKYLEE BAIRD MRN: [id number]; DOB: 1965-07-17  Admit date: 09/24/2018 Date of Consult: 09/25/2018  Primary Care Provider: [name], MD Primary Cardiologist: [name], MD  Primary Electrophysiologist:  None    Patient Profile:   HALAH WHITESIDE is a 53 y.o. female with a hx of chronic diastolic HF, chronic hypoxic respiratory failure, COPD, OSA on CPAP, morbid obesity, Paroxysmal SVT, DM-2, and CKD -3 HTN, HLD who is being seen today for the evaluation of chest pain at the request of Dr. Andria Frames.  History of Present Illness:   Ms. [facility] with above hx had last cardiology visit telemedicine on 07/24/18, she was wearing 02 most of the time, she had back pain and using TENS unit and wt was down to 390 lbs.  Planning for bariatric surgery.   She has continued to lose wt.    Yesterday she called our office and said she did not feel well and was going to ER.   She call 911 for chest pain that would come and go.  Had SOB as well despite home oxygen. [name] Kitchen   She was given ASA 324 mg by EMS, substernal chest pain.  She had gained wt then lost week prior to admit.    She was admitted by [facility] for further eval.    She has shoulder pain, back pain and neck pain.  She thinks her neck surgery will be first surgery.   EKG:  The EKG was personally reviewed and demonstrates:  SR incomplete LBBB no ST changes from 02/2018  Telemetry:  Telemetry was personally reviewed and demonstrates:  SR  LABS:   BNP 41.7   Troponin HS 6; 3 Na 138, K+ 3.3 BUN 27 Cr 1.51 Hgb 16, HCT 46 WBC 10.4 COVID neg 2 View CXR clear  Currently no pain and no complaints.  Walks to bathroom without any pain or dyspnea. She would like to go home  Heart Pathway Score:     Past Medical History:  Diagnosis Date  . Anxiety   . Arthritis 04-10-11   hips, shoulders, back  . Asthma   . Bipolar disorder ([place])   . Cancer ([place]) 1993   cervical, no treatment done, went away per pt   . Cervical dysplasia or atypia 04-10-11   '93- once dx.-got pregnant-no intervention, then postpartum, no dysplasia found  . CHF (congestive heart failure) ([place])    no cardiologist 2014 dx, none now  . Chronic kidney disease    stage 3 kidney disease  . 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 Pathology showed Condyloma, gluteal CONDYLOMA ACUMINATUM.   [name] Kitchen COPD (chronic obstructive pulmonary disease) ([place])   . Diabetes mellitus without complication ([place])   . Dyspnea    with actity, sitting  . Fall 12/16/2016  . Fibrocystic breast disease   . GERD (gastroesophageal reflux disease)   . Hepatitis    hep B-count is low at present,doesn't register  . Hypercalcemia   . Hyperlipidemia   . Hypertension   . Lung nodule 11/2017   right and left lung  . Migraine headache    none recent  . Morbid obesity ([place]) 03/27/2006  . Neuropathy   . Pneumonia    walking pneu 15 yrs. ago  . Skin lesion 03/15/2011   In gluteal crease now s/p removal by Dr. [name] of General Surgery on 3/12. Path shows condyloma.     . Sleep apnea 04-10-11   uses cpap, pt  does not know settings  . Sleep apnea   . Trigger finger    left third    Past Surgical History:  Procedure Laterality Date  . ANAL FISTULECTOMY  04/17/2011   Procedure: FISTULECTOMY ANAL;  Surgeon: Imogene Burn. [name], MD;  Location: [facility];  Service: General;  Laterality: N/A;  Excision of Condyloma Gluteal Cleft   . BACK SURGERY  04-10-11   x5-Lumbar fusion-retained hardware.(Dr. Louanne Skye)  . BREAST CYST EXCISION     bilateral breast, 3 cysts removed from each breast  . BREAST EXCISIONAL BIOPSY Right    x 3  . BREAST EXCISIONAL BIOPSY Left    x 3  . CARPAL TUNNEL RELEASE Bilateral   . Cervical biospy    . COLONOSCOPY WITH PROPOFOL N/A 07/06/2015   Procedure: COLONOSCOPY WITH PROPOFOL;  Surgeon: [name], MD;  Location: [facility] ENDOSCOPY;  Service: Endoscopy;  Laterality: N/A;  . DOPPLER ECHOCARDIOGRAPHY  04/06/2012   AT [place] 55-60%  . FRACTURE SURGERY    . I&D EXTREMITY Left 12/18/2016   Procedure: IRRIGATION AND DEBRIDEMENT LEFT FOOT, CLOSURE;  Surgeon: [name], MD;  Location: [facility];  Service: Orthopedics;  Laterality: Left;  [name] Kitchen MULTIPLE TOOTH EXTRACTIONS    . NECK SURGERY  04-10-11   x3- cervical fusion with plating and screws-Dr. [name]  . TEE WITHOUT CARDIOVERSION N/A 04/06/2012   Procedure: TRANSESOPHAGEAL ECHOCARDIOGRAM (TEE);  Surgeon: [name], MD;  Location: [facility] ENDOSCOPY;  Service: Cardiovascular;  Laterality: N/A;  . TRIGGER FINGER RELEASE Right    middle finger  . TRIGGER FINGER RELEASE Left 06/28/2016   Procedure: RELEASE TRIGGER FINGER LEFT 3RD FINGER;  Surgeon: [name], MD;  Location: [place];  Service: Orthopedics;  Laterality: Left;  . TRIGGER FINGER RELEASE Right 06/12/2016   Procedure: RIGHT INDEX FINGER TRIGGER RELEASE;  Surgeon: [name], MD;  Location: [place];  Service: Orthopedics;  Laterality: Right;  . TRIGGER FINGER RELEASE Right 01/19/2018   Procedure: RIGHT RING FINGER TRIGGER FINGER RELEASE;  Surgeon: [name], MD;  Location: [place];  Service: Orthopedics;  Laterality: Right;     Home Medications:  Prior to Admission medications   Medication Sig Start Date End Date Taking? Authorizing Provider  ACCU-CHEK SOFTCLIX LANCETS lancets TEST 4 TIMES DAILY 01/12/18  Yes [name], MD  Acetaminophen (TYLENOL EXTRA STRENGTH PO) Take 1,000 mg by mouth 2 (two) times a day.   Yes [provider]  ammonium lactate (AMLACTIN) 12 % lotion Apply 1 application topically 2 (two) times daily as needed for dry skin. 08/07/18  Yes [name], MD  atorvastatin (LIPITOR) 40 MG tablet Take 1 tablet (40 mg total) by mouth daily. 12/31/17  Yes [name], MD  baclofen (LIORESAL) 10 MG tablet Take 1 tablet (10 mg total) by mouth 3 (three) times daily as needed for muscle spasms. 03/24/18  Yes [name], MD  blood glucose meter kit and supplies KIT Dispense  based on patient and insurance preference. Use up to four times daily as directed. (FOR ICD-9 250.00, 250.01). 12/20/16  Yes Bonnita Hollow, MD  busPIRone (BUSPAR) 10 MG tablet TAKE 1 TABLET BY MOUTH THREE TIMES A DAY Patient taking differently: Take 10 mg by mouth 3 (three) times daily.  06/25/18  Yes Riccio, Levada Dy C, DO  Calcium Citrate-Vitamin D (CALCIUM CITRATE + D3 PO) Take 1 tablet by mouth daily.   Yes [provider]  Capsaicin-Menthol (PAIN RELIEF EX) Apply 1 application topically 3 (three)  times daily as needed (shoulder pain).   Yes [provider]  CHANTIX 0.5 MG tablet TAKE 1 TABLET (0.5 MG TOTAL) BY MOUTH 2 (TWO) TIMES DAILY. Patient taking differently: Take 0.5 mg by mouth 2 (two) times daily.  09/09/18  Yes [name], MD  diclofenac sodium (VOLTAREN) 1 % GEL APPLY 2 GRAMS TO AFFECTED AREA 4 TIMES A DAY Patient taking differently: Apply 4 g topically 4 (four) times daily. Per pt: applies all over body 09/23/18  Yes [name], MD  diltiazem (CARDIZEM CD) 120 MG 24 hr capsule Take 1 capsule (120 mg total) by mouth 2 (two) times daily. 01/08/18 01/03/19 Yes [name], MD  docusate sodium (COLACE) 100 MG capsule Take 200 mg by mouth 2 (two) times daily.   Yes [provider]  empagliflozin (JARDIANCE) 25 MG TABS tablet Take 25 mg by mouth daily. 08/04/18 11/02/18 Yes [name], MD  febuxostat (ULORIC) 40 MG tablet Take 40 mg by mouth at bedtime.  05/27/18  Yes [provider]  Flaxseed, Linseed, (FLAXSEED OIL) 1000 MG CAPS Take 1,000 mg by mouth daily.   Yes [provider]  fluticasone (FLONASE) 50 MCG/ACT nasal spray Place 2 sprays into both nostrils 2 (two) times daily. 07/21/18  Yes [name], MD  Fluticasone-Umeclidin-Vilant (TRELEGY ELLIPTA) 100-62.5-25 MCG/INH AEPB Inhale 1 puff into the lungs daily. 08/18/18  Yes Icard, Octavio Graves, DO  folic acid (FOLVITE) 559 MCG tablet Take 400 mcg by mouth daily.   Yes  [provider]  furosemide (LASIX) 40 MG tablet Take 2 tablets (80 mg total) by mouth 2 (two) times daily. 09/11/18  Yes [name], MD  gabapentin (NEURONTIN) 300 MG capsule TAKE 2 CAPSULES (600 MG TOTAL) BY MOUTH 3 (THREE) TIMES DAILY. 08/24/18  Yes Jessy Oto, MD  glucose blood (ACCU-CHEK AVIVA PLUS) test strip TEST 4 TIMES DAILY Patient taking differently: 1 each by Other route 3 (three) times daily. TEST 3 TIMES DAILY 08/04/18  Yes [name], MD  HYDROcodone-acetaminophen (NORCO) 7.5-325 MG tablet Take 1 tablet by mouth every 6 (six) hours as needed for moderate pain. 01/27/18  Yes [name], MD  hydrOXYzine (ATARAX/VISTARIL) 10 MG tablet TAKE 1 TABLET BY MOUTH EVERY DAY AS NEEDED Patient taking differently: Take 10 mg by mouth daily as needed for itching.  08/03/18  Yes [name], MD  Insulin Glargine (LANTUS SOLOSTAR) 100 UNIT/ML Solostar Pen Inject 22 Units into the skin daily before breakfast. Patient taking differently: Inject 20 Units into the skin daily before breakfast.  06/02/18  Yes Orson Eva J, DO  insulin lispro (HUMALOG KWIKPEN) 100 UNIT/ML KwikPen Inject 0.06 mLs (6 Units total) into the skin 3 (three) times daily before meals. 07/08/18  Yes [name], MD  Insulin Pen Needle (B-D UF III MINI PEN NEEDLES) 31G X 5 MM MISC CHECK SUGARS 4 TIMES A DAY BEFORE MEALS AND AT BEDTIME. 08/14/18  Yes [name], MD  ketoconazole (NIZORAL) 2 % cream APPLY 1 FINGERTIP AMOUNT TO EACH FOOT DAILY. Patient taking differently: Apply 1 application topically daily. Apply 1 fingertip amount to each foot daily. 07/20/18  Yes Gardiner Barefoot, DPM  loratadine (CLARITIN) 10 MG tablet Take 1 tablet (10 mg total) by mouth daily. 05/29/17  Yes [name], MD  losartan (COZAAR) 100 MG tablet Take 1 tablet (100 mg total) by mouth daily. 05/29/17  Yes [name], MD  metolazone (ZAROXOLYN) 2.5 MG tablet Take 1 tablet (2.5 mg total) by mouth 2 (two) times a  week. TAKE 1  TABLET TWICE WEEKLY ON MONDAYS AND THURSDAYS ONLY 06/18/18  Yes [name], MD  montelukast (SINGULAIR) 10 MG tablet TAKE 1 TABLET AT BEDTIME Patient taking differently: Take 10 mg by mouth at bedtime.  03/02/18  Yes [name], MD  Multiple Vitamins-Minerals (MULTIVITAMIN WITH MINERALS) tablet Take 1 tablet by mouth daily.   Yes [provider]  mupirocin ointment (BACTROBAN) 2 % APPLY TO AFFECTED AREA TWICE A DAY Patient taking differently: Apply 1 application topically 2 (two) times daily.  09/09/18  Yes [name], MD  NARCAN 4 MG/0.1ML LIQD nasal spray kit Place 1 spray into the nose once.  02/16/18  Yes [provider]  NON FORMULARY Uses a C-PAP at bedtime   Yes [provider]  nystatin (MYCOSTATIN) 100000 UNIT/ML suspension TAKE 5 MLS (500,000 UNITS TOTAL) BY MOUTH 4 (FOUR) TIMES DAILY. SWISH AND SWALLOW. 09/08/18  Yes [name], MD  Omega-3 Fatty Acids (FISH OIL) 1000 MG CAPS Take 1,000 mg by mouth daily.    Yes [provider]  omeprazole (PRILOSEC) 20 MG capsule Take 1 capsule (20 mg total) by mouth daily as needed. 02/04/18  Yes [name], MD  potassium chloride SA (KLOR-CON M20) 20 MEQ tablet Take 1 tablet (20 mEq total) by mouth 2 (two) times a week. Take 1 tablet twice weekly on Monday and Thursday. 05/18/18  Yes [name], MD  PROAIR HFA 108 [phone] Base) MCG/ACT inhaler INHALE 2 PUFFS INTO THE LUNGS EVERY 6 (SIX) HOURS AS NEEDED FOR SHORTNESS OF BREATH. Patient taking differently: Inhale 2 puffs into the lungs every 6 (six) hours as needed for shortness of breath.  03/03/18  Yes [name], MD  promethazine (PHENERGAN) 25 MG tablet Take 1 tablet (25 mg total) by mouth every 6 (six) hours as needed for nausea. 01/19/18  Yes [name], MD  sertraline (ZOLOFT) 25 MG tablet TAKE 1 TABLET BY MOUTH EVERY DAY Patient taking differently: Take 25 mg by mouth daily.  08/03/18  Yes [name], MD  TOVIAZ 8 MG TB24  tablet Take 8 mg by mouth daily. 08/31/18  Yes [provider]  Vitamin D, Ergocalciferol, (DRISDOL) 50000 units CAPS capsule Take 50,000 Units by mouth daily. For 30 days   Yes [provider]  hydrOXYzine (VISTARIL) 100 MG capsule Take 1 capsule (100 mg total) by mouth 3 (three) times daily as needed for itching. Patient not taking: Reported on 09/24/2018 04/22/18   [name], MD  oxybutynin (DITROPAN XL) 10 MG 24 hr tablet Take 1 tablet (10 mg total) by mouth at bedtime. 10/07/17   Martyn Malay, MD    Inpatient Medications: Scheduled Meds: . atorvastatin  40 mg Oral Daily  . busPIRone  10 mg Oral TID  . calcium-vitamin D  1 tablet Oral Q breakfast  . canagliflozin  100 mg Oral QAC breakfast  . diclofenac sodium  4 g Topical TID AC & HS  . diltiazem  120 mg Oral BID  . docusate sodium  200 mg Oral BID  . enoxaparin (LOVENOX) injection  80 mg Subcutaneous Daily  . fesoterodine  8 mg Oral Daily  . fluticasone  2 spray Each Nare BID  . fluticasone furoate-vilanterol  1 puff Inhalation Daily   And  . umeclidinium bromide  1 puff Inhalation Daily  . folic acid  1 mg Oral Daily  . furosemide  80 mg Oral BID  . gabapentin  600 mg Oral TID  . insulin aspart  0-9 Units Subcutaneous TID WC  . insulin glargine  10 Units Subcutaneous QAC breakfast  . ketoconazole  1 application Topical Daily  . loratadine  10 mg Oral Daily  . losartan  100 mg Oral Daily  . [START ON 09/28/2018] metolazone  2.5 mg Oral Once per day on Mon Thu  . montelukast  10 mg Oral QHS  . multivitamin with minerals  1 tablet Oral Daily  . omega-3 acid ethyl esters  1 g Oral Daily  . oxybutynin  10 mg Oral QHS  . pantoprazole  40 mg Oral Daily  . sertraline  25 mg Oral Daily  . varenicline  0.5 mg Oral BID WC   Continuous Infusions:  PRN Meds: acetaminophen, albuterol, ammonium lactate, baclofen, HYDROcodone-acetaminophen, hydrOXYzine, promethazine  Allergies:    Allergies  Allergen Reactions   . Aspirin Other (See Comments)    Kidney disease  . Chantix [Varenicline Tartrate] Other (See Comments)    On the 1 mg dose, patient experienced hallucintation  . 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 Corticosteroids Other (See Comments)    Hyperglycemia   . Levofloxacin Itching    Social History:   Social History   Socioeconomic History  . Marital status: Single    Spouse name: Not on file  . Number of children: 1  . Years of education: 16  . Highest education level: 12th grade  Occupational History    Comment: Disabled  Social Needs  . Financial resource strain: Not hard at all  . Food insecurity    Worry: Never true    Inability: Never true  . Transportation needs    Medical: No    Non-medical: No  Tobacco Use  . Smoking status: Current Some Day Smoker    Packs/day: 0.25    Years: 40.00    Pack years: 10.00    Types: Cigarettes    Start date: 01/28/1977    Last attempt to quit: 09/07/2017    Years since quitting: 1.0  . Smokeless tobacco: Never Used  . Tobacco comment: Quit prior to hospitalization using low dose Chantix  Substance and Sexual Activity  . Alcohol use: No    Alcohol/week: 0.0 standard drinks  . Drug use: Not Currently    Types: Marijuana, "Crack" cocaine    Comment: hx marijuana usemany years ago, Crack -none since 11/2015  . Sexual activity: Not Currently    Partners: Male    Birth control/protection: Post-menopausal  Lifestyle  . Physical activity    Days per week: 0 days    Minutes per session: 0 min  . Stress: Not at all  Relationships  . Social connections    Talks on phone: More than three times a week    Gets together: Twice a week    Attends religious service: 1 to 4 times per year    Active member of club or organization: No    Attends meetings of clubs or organizations: Never    Relationship status: Divorced  . Intimate partner violence    Fear of current or ex partner: No    Emotionally abused:  No    Physically abused: No    Forced sexual activity: No  Other Topics Concern  . Not on file  Social History Narrative   Current Social History       Who lives at home: Patient lives alone in one level home; has 4 steps onto porch with a handrail. Has smoke  alarms, no throw rugs. Has elevated toilet. Has shower bench in tub with grab rails.    Transportation: Patient has own vehicle and drives herself    Important Relationships "Family and friends"    Pets: None    Likes to eat varied diet. Seafood, fish, roasted [place] breast, vegetables and salads and fruits.   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 I'm a lovable person."                                                                                                    Family History:    Family History  Problem Relation Age of Onset  . Pulmonary embolism Mother   . Stroke Father   . Alcohol abuse Father   . Pneumonia Father   . Cancer Maternal Aunt        breast  . Breast cancer Maternal Aunt   . Asthma Other   . COPD Other   . Hypertension Other   . Diabetes Other   . Asthma Sister   . Depression Sister   . Arthritis Sister   . Sickle cell anemia Son      ROS:  Please see the history of present illness.  General:no colds or fevers, no weight changes Skin:no rashes or ulcers HEENT:no blurred vision, no congestion CV:see HPI PUL:see HPI GI:no diarrhea constipation or melena, no indigestion GU:no hematuria, no dysuria MS:+ knee pain,  + neck pain, + back pain no claudication Neuro:no syncope, no lightheadedness Endo:+ diabetes insulin depend., no thyroid disease  All other ROS reviewed and negative.     Physical Exam/Data:   Vitals:   09/25/18 0154  09/25/18 0204 09/25/18 0759 09/25/18 0950  BP:  (!) 145/88  119/90  Pulse:  82  82  Resp:  20    Temp:  98.1 F (36.7 C)    TempSrc:  Oral    SpO2:  100% 99% 98%  Weight: (!) 173.5 kg     Height: '5\' 10"'  (1.778 m)       Intake/Output Summary (Last 24 hours) at 09/25/2018 1151 Last data filed at 09/25/2018 0932 Gross per 24 hour  Intake 477 ml  Output 500 ml  Net -23 ml   Last 3 Weights 09/25/2018 09/24/2018 09/11/2018  Weight (lbs) 382 lb 8 oz 385 lb 391 lb 9.6 oz  Weight (kg) 173.501 kg 174.635 kg 177.629 kg     Body mass index is 54.88 kg/m.  General:  Well nourished, well developed, in no acute distress HEENT: normal Lymph: no adenopathy Neck: no JVD Endocrine:  No thryomegaly Vascular: No carotid bruits; pedal pulses 2+ bilaterally   Cardiac:  normal S1, S2; RRR; no murmur gallup rub or click Lungs:  clear to auscultation  bilaterally, no wheezing, rhonchi or rales  Abd: soft, nontender, no hepatomegaly  Ext: no edema- she notes best her legs have been Musculoskeletal:  No deformities, BUE and BLE strength normal and equal Skin: warm and dry  Neuro:  Alert and oriented X 3, no focal abnormalities noted Psych:  Normal affect     Relevant CV Studies: Echo 10/01/17 Study Conclusions  - Left ventricle: The cavity size was normal. Wall thickness was   normal. Systolic function was normal. The estimated ejection   fraction was in the range of 55% to 60%. Wall motion was normal;   there were no regional wall motion abnormalities. Doppler   parameters are consistent with abnormal left ventricular   relaxation (grade 1 diastolic dysfunction).  NUC Study Highlights   Nuclear stress EF: 71%.  The left ventricular ejection fraction is hyperdynamic (>65%).  There was no ST segment deviation noted during stress.  There is a small defect of mild severity present in the apical septal location. The defect is non-reversible. No ischemia noted.  This is a low risk study.    Event monitor  12/02/17 Study Highlights  3 days of rhythm interpreted on Zio patch. Patient had a min HR of 58 bpm, max HR of 193 bpm, and avg HR of 81 bpm. Predominant underlying rhythm was Sinus Rhythm. 217 Supraventricular Tachycardia runs occurred, the run with the fastest interval lasting 6 beats with a max rate of 193 bpm, the longest lasting 1 min 13 secs with an avg rate of 151 bpm. True duration of Supraventricular Tachycardia difficult to ascertain due to Artifact. Thirteen triggered patient events recorded. Supraventricular Tachycardia was detected within +/- 45 seconds of symptomatic patient event(s). Isolated SVEs were occasional (2.0%, 6813), SVE Couplets were rare (<1.0%, 235), and SVE Triplets were rare (<1.0%, 66). No significant ventricular ectopy noted.    Laboratory Data:  High Sensitivity Troponin:   Recent Labs  Lab 09/24/18 1821 09/25/18 0333  TROPONINIHS 6 3     Chemistry Recent Labs  Lab 09/24/18 1821 09/24/18 2336 09/25/18 1022  NA 137  --  138  K 3.9  --  3.3*  CL 95*  --  97*  CO2 25  --  27  GLUCOSE 173*  --  182*  BUN 32*  --  27*  CREATININE 1.61* 1.60* 1.51*  CALCIUM 9.2  --  9.0  GFRNONAA 36* 36* 39*  GFRAA 41* 42* 45*  ANIONGAP 17*  --  14    No results for input(s): PROT, ALBUMIN, AST, ALT, ALKPHOS, BILITOT in the last 168 hours. Hematology Recent Labs  Lab 09/24/18 1821 09/24/18 2336  WBC 11.6* 10.4  RBC 6.11* 5.91*  HGB 16.7* 16.0*  HCT 48.3* 46.7*  MCV 79.1* 79.0*  MCH 27.3 27.1  MCHC 34.6 34.3  RDW 17.3* 16.6*  PLT 294 282   BNP Recent Labs  Lab 09/24/18 1727  BNP 41.7    DDimer No results for input(s): DDIMER in the last 168 hours.   Radiology/Studies:  Dg Chest 2 View  Result Date: 09/24/2018 CLINICAL DATA:  Chest pain and shortness of breath. EXAM: CHEST - 2 VIEW COMPARISON:  01/27/2018 FINDINGS: Heart size and pulmonary vascularity are normal. No infiltrates or effusions. No acute bone abnormality.  IMPRESSION: No active cardiopulmonary disease. Electronically Signed   By: Lorriane Shire M.D.   On: 09/24/2018 18:14   Mr Cervical Spine W/o Contrast  Result Date: 09/21/2018 CLINICAL DATA:  Neck pain. Bilateral shoulder and arm pain.  Previous cervical spine surgery. EXAM: MRI CERVICAL SPINE WITHOUT CONTRAST TECHNIQUE: Multiplanar, multisequence MR imaging of the cervical spine was performed. No intravenous contrast was administered. COMPARISON:  September 04, 2017 cervical spine MRI FINDINGS: Alignment: Straightening of normal cervical lordosis. No spondylolisthesis. Vertebrae: Posterior spinal fusion hardware extends from C4-T2 with ACDF hardware at C7-T1 Cord: Normal signal and morphology. Posterior Fossa, vertebral arteries, paraspinal tissues: Negative. Disc levels: C1-C2: Normal. C2-C3: Normal disc space and facets. No spinal canal or neuroforaminal stenosis. C3-C4: Posterior fusion. No spinal canal or neural foraminal stenosis. C4-C5: Posterior fusion. No spinal canal or neural foraminal stenosis. C5-C6: Posterior fusion. Small central disc protrusion is unchanged. No spinal canal or neural foraminal stenosis. C6-C7: Laminectomy without spinal canal stenosis. C7-T1: Laminectomy without spinal canal stenosis. IMPRESSION: Unchanged postoperative appearance of the cervical spine with posterior fusion from C4-T2 and ACDF at C7-T1. No spinal canal stenosis or neural impingement. Electronically Signed   By: Ulyses Jarred M.D.   On: 09/21/2018 23:14    Assessment and Plan:   1. Chest pain neg troponins - neg nuc study 10/2017  reassuring - may have been muscular skeletal. Ok for neck surgery if this to be done.  No plans for bariatric surgery at this time.  Dr. Marlou Porch to see.  2. Chronic diastolic HF - CXR clear, BNP stable.  On metolazine Monday and Thursday on lasix 80 BID  3. P SVT stable.  On dilt po, SR only on  Tele.  4. Htn now 119/90  5. HLD on atorvastatin HDL 37, LDL 76 tchol 146 and TG 165.    6. Morbid obesity on her own diet at home, has decreased food.      For questions or updates, please contact [place] Please consult [email or web address] for contact info under     Signed, Cecilie Kicks, NP  09/25/2018 11:51 AM  Personally seen and examined. Agree with above.  53 year old with morbid obesity here with atypical chest pain.  Sounds to me like it could have been musculoskeletal.  Thankfully, troponin normal.  Appears comfortable.  Looks stable.  Negative nuclear scan, low risk, no significant ischemia in October of last year.  She may go ahead and proceed with bariatric surgery when she is ready.  I would continue her current regimen of diuretics.  Blood pressure appears stable.  No further cardiac testing at this time.  Candee Furbish, MD

## 2018-09-25 NOTE — Discharge Instructions (Signed)
Thank you for allowing [place] to participate in your care!    You were admitted for chest pain and evaluated by cardiology who recommend that you continue your current medications outpatient.  We did not find any worrisome findings while you were admitted.  Please be sure to follow-up with your primary care provider and cardiologist.  If you experience worsening of your admission symptoms, develop shortness of breath, life threatening emergency, suicidal or homicidal thoughts you must seek medical attention immediately by calling 911 or calling your MD immediately  if symptoms less severe.   Chest Wall Pain Chest wall pain is pain in or around the bones and muscles of your chest. Chest wall pain may be caused by:  An injury.  Coughing a lot.  Using your chest and arm muscles too much. Sometimes, the cause may not be known. This pain may take a few weeks or longer to get better. Follow these instructions at home: Managing pain, stiffness, and swelling If told, put ice on the painful area:  Put ice in a plastic bag.  Place a towel between your skin and the bag.  Leave the ice on for 20 minutes, 2-3 times a day.  Activity  Rest as told by your doctor.  Avoid doing things that cause pain. This includes lifting heavy items.  Ask your doctor what activities are safe for you. General instructions   Take over-the-counter and prescription medicines only as told by your doctor.  Do not use any products that contain nicotine or tobacco, such as cigarettes, e-cigarettes, and chewing tobacco. If you need help quitting, ask your doctor.  Keep all follow-up visits as told by your doctor. This is important. Contact a doctor if:  You have a fever.  Your chest pain gets worse.  You have new symptoms. Get help right away if:  You feel sick to your stomach (nauseous) or you throw up (vomit).  You feel sweaty or light-headed.  You have a cough with mucus from your lungs (sputum) or you  cough up blood.  You are short of breath. These symptoms may be an emergency. Do not wait to see if the symptoms will go away. Get medical help right away. Call your local emergency services (911 in the [place] Do not drive yourself to the hospital. Summary  Chest wall pain is pain in or around the bones and muscles of your chest.  It may be treated with ice, rest, and medicines. Your condition may also get better if you avoid doing things that cause pain.  Contact a doctor if you have a fever, chest pain that gets worse, or new symptoms.  Get help right away if you feel light-headed or you get short of breath. These symptoms may be an emergency. This information is not intended to replace advice given to you by your health care provider. Make sure you discuss any questions you have with your health care provider. Document Released: 07/03/2007 Document Revised: 07/17/2017 Document Reviewed: 07/17/2017 Elsevier Patient Education  2020 [employer].

## 2018-09-25 NOTE — Plan of Care (Signed)

## 2018-09-25 NOTE — Progress Notes (Signed)
Family Medicine Teaching Service Daily Progress Note Intern Pager: [phone]  Patient name: [name] Medical record number: [id number] Date of birth: 06-23-1965 Age: 53 y.o. Gender: female  Primary Care Provider: [name], MD Consultants: Cardiology Code Status: FULL  Pt Overview and Major Events to Date:  Admitted 08/27.  Assessment and Plan:  Chest pain with associated shortness of breath Patient reports multiple episodes of chest pain that starts in her chest and radiates to her arm over the last week.  She had one episode last Saturday, one episode on Sunday and 1 episodes today while I was present in the room and did not radiate and was found to be in the pectoralis major region. I performed counterstrain with symptomatic relief after 1 minute of onset.  Previous chest pain episodes resolved after about a minute but has lasted longer in the past.    While having chest pain the patient also says she is increased shortness of breath.  It happens both at rest and is exertional.  She is on 2 L of oxygen at home and those 2 L do not resolve the dyspnea.   Will consult cardiology in the morning for further recommendations.  Patient does have multiple cardiac risk factors including CKD, diabetes, hypertension, morbid obesity, OSA, likely OHS.  Overall does not appear to be ACS or as though patient is having a CHF exacerbation.  Patient is not tachycardic. Can consider further work-up if does not improve.  Patient also has history of COPD and is currently smoking, however has no wheeze on exam and does not appear to be in COPD exacerbation either.  Chest x-ray did not support fluid overload or pneumonia so these are also unlikely. -Continue to trend troponins - Consult cardiology appreciate recommendations  - Continuous telemetry - Continuous pulse ox; 99% on 3L - Continue home hypertensive and other cardiac medications  HTN BP SBP 117-145 DBP 65-99. Patient takes diltiazem 120 mg  twice daily, Lasix 80 mg twice daily, losartan 100 mg daily, - Continue home medications above    COPD Patient has a 40 pack year history of smoking.  Had quit but has recently restarted due to increased life stressors.  Reports smoking 3 to 4 cigarettes daily.  Patient takes pro-air and Trelegy Ellipta - Continue Trelegy Ellipta and pro-air while admitted -Oxygen saturation goal between 88 and 92%. 99% 3L [place]. -Continuous pulse ox  CKD stage IV Creatinine is 1.60.  Baseline appears 1.5 and 1.8 -Daily BMP -Replace electrolytes as indicated -Avoid nephrotoxic drugs  T2 DM  Home meds include Jardiance,  Lantus 20 units at breakfast, Humalog 6 units 3 times a day before meals. CBGs 148. -Invokana 100 mg daily -Lantus 10 units at breakfast - Sensitive sliding scale insulin - Monitor CBGs 3 times daily/AC  Anxiety/depression Patient currently taking Zoloft 25 mg daily and buspirone 10 mg 3 times a day.  Patient reports depression due to the recent loss of family members.  Patient reports she has been smoking again 3 to 4 cigarettes a day because of her depression. - Continue Zoloft and BuSpar -Patient denies the need or want for nicotine patches/gum -Continue Chantix  OSA Patient reports she is on BiPAP at home. - BiPAP in hospital nightly  Chronic pain Home med includes Norco 7.5-325 every 6 hours as needed, gabapentin, baclofen, Voltaren gel, Tylenol 1000 mg every 6 - Continue home meds  Seasonal allergies Patient takes montelukast, loratadine, Flonase at home -Continue home meds  Overactive bladder Patient  takes oxybutynin 10 mg at bedtime and Toviaz 8 mg daily -Continue home medications  Hyperlipidemia Patient on atorvastatin 40 mg daily.  Last lipid panel was over a year ago. -Continue to start atorvastatin 40 mg daily   FEN/GI: Heart healthy Prophylaxis: Lovenox 40 mg  Disposition: Pending cardiology   Subjective:  On entering the room she did not  initially endorse chest pain.  5 minutes into interviewing her history she began to complain of chest pain in the pectoralis major region and discomfort increased with manipulation of the left arm but did not radiate into the left arm.  Strain was performed on the pectoralis major muscle and pain was relieved within 1 minute.  Objective: Temp:  [98.1 F (36.7 C)-98.7 F (37.1 C)] 98.1 F (36.7 C) (08/28 0204) Pulse Rate:  [56-90] 82 (08/28 0204) Resp:  [12-23] 20 (08/28 0204) BP: (113-145)/(64-99) 145/88 (08/28 0204) SpO2:  [91 %-100 %] 100 % (08/28 0204) Weight:  [173.5 kg-174.6 kg] 173.5 kg (08/28 0154) Physical Exam:  General: Appears well, no acute distress. Age appropriate. Cardiac: RRR, normal heart sounds, no murmurs Respiratory: CTAB, normal effort Abdomen: soft, nontender, nondistended Extremities: No edema or cyanosis. Skin: Warm and dry, no rashes noted Neuro: alert and oriented, no focal deficits Psych: normal affect  Laboratory: Recent Labs  Lab 09/24/18 1821 09/24/18 2336  WBC 11.6* 10.4  HGB 16.7* 16.0*  HCT 48.3* 46.7*  PLT 294 282   Recent Labs  Lab 09/24/18 1821 09/24/18 2336  NA 137  --   K 3.9  --   CL 95*  --   CO2 25  --   BUN 32*  --   CREATININE 1.61* 1.60*  CALCIUM 9.2  --   GLUCOSE 173*  --       Imaging/Diagnostic Tests: CHEST - 2 VIEW COMPARISON:  01/27/2018 IMPRESSION: No active cardiopulmonary disease.   Gerlene Fee, DO 09/25/2018, 12:22 PM PGY-1, [place] Intern pager: [phone], text pages welcome

## 2018-09-25 NOTE — TOC Transition Note (Signed)
Transition of Care [facility]) - CM/SW Discharge Note   Patient Details  Name: CATHLENE GARDELLA MRN: [id number] Date of Birth: 1965/08/19  [facility] CM/SW Contact:  Alberteen Sam, [place] Phone Number: [phone] 09/25/2018, 3:12 PM   Clinical Narrative:     CSW met with patient to discuss discharge plan. She reports she is currently on 2 LO2 at home through [place]. CSW notes patient is going home on 2L O2, no changes. Patient reports her family is picking her up for discharge, no transportation needs. Patient reports no needs at this time, as she is fully prepared for discharge.  Please contact CSW should needs arise.   Final next level of care: Home/Self Care Barriers to Discharge: No Barriers Identified   Patient Goals and CMS Choice Patient states their goals for this hospitalization and ongoing recovery are:: to go home CMS Medicare.gov Compare Post Acute Care list provided to:: Patient Choice offered to / list presented to : Patient  Discharge Placement                Patient to be transferred to facility by: Family to transport   Patient and family notified of of transfer: 09/25/18  Discharge Plan and Services                                     Social Determinants of Health (SDOH) Interventions     Readmission Risk Interventions No flowsheet data found.

## 2018-09-28 NOTE — Discharge Summary (Signed)
[facility] Discharge Summary  Patient name: [name] Medical record number: [id number] Date of birth: Oct 09, 1965 Age: 53 y.o. Gender: female Date of Admission: 09/24/2018  Date of Discharge: 09/25/2018 Admitting Physician: Mary Shave, MD  Primary Care Provider: [name], MD Consultants: Cardiology  Indication for Hospitalization: Chest pain associated with shortness of breath  Discharge Diagnoses/Problem List:   Chest pain with associated shortness of breath HTN COPD CKD stage IV T2 DM Anxiety/depression OSA Chronic pain Seasonal allergies Overactive bladder Hyperlipidemia  Disposition: Home  Discharge Condition: Stable  Discharge Exam:  General: Appears well, no acute distress. Age appropriate. Cardiac: RRR, normal heart sounds, no murmurs Respiratory: CTAB, normal effort Abdomen: soft, nontender, nondistended Extremities: No edema or cyanosis. Skin: Warm and dry, no rashes noted Neuro: alert and oriented, no focal deficits Psych: normal affect   Brief Hospital Course:  [name]  presented with chest pain x1 week. Her chest and was found to radiate. She had four episodes of this chest pain that was also accompanied by shortness of breath that lasted on average 1 minute. She had a prolonged episode that brought her to the ED. She was on 2 L of oxygen at home and those 2 L do not resolve the dyspnea.  On arrival to the emergency room EKG showed sinus rhythm with atrial premature complex, incomplete left bundle branch block, and borderline prolonged QT interval of 486.  Initial troponin was 6.  A BNP was also drawn and was 41.7 which is approximately baseline.  Emergency room provider spoke with cardiology who recommended admission. She was placed on 3 L nasal cannula and chest pain and shortness of breath improved the next day. During morning rounds she began to complain of chest pain in the pectoralis major region and  discomfort increased with manipulation of the left arm but did not radiate into the left arm. Counterstrain was performed on the pectoralis major muscle and pain was relieved within 1 minute. Cardiology was consulted and agreed that this may have been musculoskeletal as pain was reproducible with manipulation. She was discharged in stable condition and encouraged to follow up with her PCP.  Issues for Follow Up:  1.Oxygen requirement at home; CPAP use 2. Chronic opioid use 3. Smoking cessation  Significant Procedures: None  Significant Labs and Imaging:  Recent Labs  Lab 09/24/18 1821 09/24/18 2336  WBC 11.6* 10.4  HGB 16.7* 16.0*  HCT 48.3* 46.7*  PLT 294 282   Recent Labs  Lab 09/24/18 1821 09/24/18 2336 09/25/18 1022  NA 137  --  138  K 3.9  --  3.3*  CL 95*  --  97*  CO2 25  --  27  GLUCOSE 173*  --  182*  BUN 32*  --  27*  CREATININE 1.61* 1.60* 1.51*  CALCIUM 9.2  --  9.0   CHEST - 2 VIEW COMPARISON:  01/27/2018 IMPRESSION: No active cardiopulmonary disease.    Results/Tests Pending at Time of Discharge: None  Discharge Medications:  Allergies as of 09/25/2018      Reactions   Aspirin Other (See Comments)   Kidney disease   Chantix [varenicline Tartrate] Other (See Comments)   On the 1 mg dose, patient experienced hallucintation   Methadone Hcl Other (See Comments)    "blacked out" in 1990's   Corticosteroids Other (See Comments)   Hyperglycemia   Levofloxacin Itching      Medication List    STOP taking these medications   oxybutynin  10 MG 24 hr tablet Commonly known as: Ditropan XL     TAKE these medications   Accu-Chek Aviva Plus test strip Generic drug: glucose blood TEST 4 TIMES DAILY What changed:   how much to take  how to take this  when to take this  additional instructions   Accu-Chek Softclix Lancets lancets TEST 4 TIMES DAILY   ammonium lactate 12 % lotion Commonly known as: AmLactin Apply 1 application topically 2  (two) times daily as needed for dry skin.   atorvastatin 40 MG tablet Commonly known as: LIPITOR Take 1 tablet (40 mg total) by mouth daily.   B-D UF III MINI PEN NEEDLES 31G X 5 MM Misc Generic drug: Insulin Pen Needle CHECK SUGARS 4 TIMES A DAY BEFORE MEALS AND AT BEDTIME.   baclofen 10 MG tablet Commonly known as: LIORESAL Take 1 tablet (10 mg total) by mouth 3 (three) times daily as needed for muscle spasms.   blood glucose meter kit and supplies Kit Dispense based on patient and insurance preference. Use up to four times daily as directed. (FOR ICD-9 250.00, 250.01).   busPIRone 10 MG tablet Commonly known as: BUSPAR TAKE 1 TABLET BY MOUTH THREE TIMES A DAY   CALCIUM CITRATE + D3 PO Take 1 tablet by mouth daily.   Chantix 0.5 MG tablet Generic drug: varenicline TAKE 1 TABLET (0.5 MG TOTAL) BY MOUTH 2 (TWO) TIMES DAILY. What changed: See the new instructions.   diclofenac sodium 1 % Gel Commonly known as: VOLTAREN APPLY 2 GRAMS TO AFFECTED AREA 4 TIMES A DAY What changed: See the new instructions.   diltiazem 120 MG 24 hr capsule Commonly known as: CARDIZEM CD Take 1 capsule (120 mg total) by mouth 2 (two) times daily.   docusate sodium 100 MG capsule Commonly known as: COLACE Take 200 mg by mouth 2 (two) times daily. Notes to patient: 8/28   empagliflozin 25 MG Tabs tablet Commonly known as: JARDIANCE Take 25 mg by mouth daily.   febuxostat 40 MG tablet Commonly known as: ULORIC Take 40 mg by mouth at bedtime.   Fish Oil 1000 MG Caps Take 1,000 mg by mouth daily.   Flaxseed Oil 1000 MG Caps Take 1,000 mg by mouth daily.   fluticasone 50 MCG/ACT nasal spray Commonly known as: FLONASE Place 2 sprays into both nostrils 2 (two) times daily. Notes to patient: 1/47   folic acid 829 MCG tablet Commonly known as: FOLVITE Take 400 mcg by mouth daily.   furosemide 40 MG tablet Commonly known as: LASIX Take 2 tablets (80 mg total) by mouth 2 (two) times  daily.   gabapentin 300 MG capsule Commonly known as: NEURONTIN TAKE 2 CAPSULES (600 MG TOTAL) BY MOUTH 3 (THREE) TIMES DAILY.   HYDROcodone-acetaminophen 7.5-325 MG tablet Commonly known as: NORCO Take 1 tablet by mouth every 6 (six) hours as needed for moderate pain.   hydrOXYzine 10 MG tablet Commonly known as: ATARAX/VISTARIL TAKE 1 TABLET BY MOUTH EVERY DAY AS NEEDED What changed: reasons to take this   hydrOXYzine 100 MG capsule Commonly known as: VISTARIL Take 1 capsule (100 mg total) by mouth 3 (three) times daily as needed for itching.   Insulin Glargine 100 UNIT/ML Solostar Pen Commonly known as: Lantus SoloStar Inject 22 Units into the skin daily before breakfast. What changed: how much to take   insulin lispro 100 UNIT/ML KwikPen Commonly known as: HumaLOG KwikPen Inject 0.06 mLs (6 Units total) into the skin 3 (three) times  daily before meals.   ketoconazole 2 % cream Commonly known as: NIZORAL APPLY 1 FINGERTIP AMOUNT TO EACH FOOT DAILY. What changed: See the new instructions. Notes to patient: 8/29   loratadine 10 MG tablet Commonly known as: CLARITIN Take 1 tablet (10 mg total) by mouth daily.   losartan 100 MG tablet Commonly known as: COZAAR Take 1 tablet (100 mg total) by mouth daily.   metolazone 2.5 MG tablet Commonly known as: ZAROXOLYN Take 1 tablet (2.5 mg total) by mouth 2 (two) times a week. TAKE 1 TABLET TWICE WEEKLY ON MONDAYS AND THURSDAYS ONLY   montelukast 10 MG tablet Commonly known as: SINGULAIR TAKE 1 TABLET AT BEDTIME   multivitamin with minerals tablet Take 1 tablet by mouth daily.   mupirocin ointment 2 % Commonly known as: BACTROBAN APPLY TO AFFECTED AREA TWICE A DAY What changed: See the new instructions.   Narcan 4 MG/0.1ML Liqd nasal spray kit Generic drug: naloxone Place 1 spray into the nose once.   NON FORMULARY Uses a C-PAP at bedtime   nystatin 100000 UNIT/ML suspension Commonly known as: MYCOSTATIN TAKE  5 MLS (500,000 UNITS TOTAL) BY MOUTH 4 (FOUR) TIMES DAILY. SWISH AND SWALLOW.   omeprazole 20 MG capsule Commonly known as: PRILOSEC Take 1 capsule (20 mg total) by mouth daily as needed.   PAIN RELIEF EX Apply 1 application topically 3 (three) times daily as needed (shoulder pain).   potassium chloride SA 20 MEQ tablet Commonly known as: Klor-Con M20 Take 1 tablet (20 mEq total) by mouth 2 (two) times a week. Take 1 tablet twice weekly on Monday and Thursday.   ProAir HFA 108 (90 Base) MCG/ACT inhaler Generic drug: albuterol INHALE 2 PUFFS INTO THE LUNGS EVERY 6 (SIX) HOURS AS NEEDED FOR SHORTNESS OF BREATH. What changed: See the new instructions.   promethazine 25 MG tablet Commonly known as: PHENERGAN Take 1 tablet (25 mg total) by mouth every 6 (six) hours as needed for nausea.   sertraline 25 MG tablet Commonly known as: ZOLOFT TAKE 1 TABLET BY MOUTH EVERY DAY   Toviaz 8 MG Tb24 tablet Generic drug: fesoterodine Take 8 mg by mouth daily.   Trelegy Ellipta 100-62.5-25 MCG/INH Aepb Generic drug: Fluticasone-Umeclidin-Vilant Inhale 1 puff into the lungs daily.   TYLENOL EXTRA STRENGTH PO Take 1,000 mg by mouth 2 (two) times a day.   Vitamin D (Ergocalciferol) 1.25 MG (50000 UT) Caps capsule Commonly known as: DRISDOL Take 50,000 Units by mouth daily. For 30 days       Discharge Instructions: Please refer to Patient Instructions section of EMR for full details.  Patient was counseled important signs and symptoms that should prompt return to medical care, changes in medications, dietary instructions, activity restrictions, and follow up appointments.   Follow-Up Appointments: Follow-up Information    Mary Shave, MD. Go on 09/29/2018.   Specialty: Family Medicine Why: _0 :55am Contact information: [address] [address] [phone]           Gerlene Fee, DO 09/28/2018, 2:58 AM PGY-1, [place]

## 2018-09-29 ENCOUNTER — Other Ambulatory Visit: Payer: Self-pay

## 2018-09-29 ENCOUNTER — Other Ambulatory Visit: Payer: Self-pay | Admitting: *Deleted

## 2018-09-29 ENCOUNTER — Ambulatory Visit (INDEPENDENT_AMBULATORY_CARE_PROVIDER_SITE_OTHER): Payer: Medicare Other | Admitting: Family Medicine

## 2018-09-29 ENCOUNTER — Encounter: Payer: Self-pay | Admitting: Family Medicine

## 2018-09-29 DIAGNOSIS — I5032 Chronic diastolic (congestive) heart failure: Secondary | ICD-10-CM | POA: Diagnosis not present

## 2018-09-29 DIAGNOSIS — E1122 Type 2 diabetes mellitus with diabetic chronic kidney disease: Secondary | ICD-10-CM | POA: Diagnosis not present

## 2018-09-29 DIAGNOSIS — N183 Chronic kidney disease, stage 3 unspecified: Secondary | ICD-10-CM

## 2018-09-29 DIAGNOSIS — Z794 Long term (current) use of insulin: Secondary | ICD-10-CM | POA: Diagnosis not present

## 2018-09-29 DIAGNOSIS — Z720 Tobacco use: Secondary | ICD-10-CM

## 2018-09-29 NOTE — Patient Instructions (Addendum)
It was nice to see you today,  I am glad to see that your shortness of breath and chest pain have resolved.  It is important for you to stop smoking.  I am glad you have decided to quit again, if you need any more help with quitting let us know and we can help you.  I would like to have you come back in 3 to 4 weeks to talk with your primary care provider, Dr. Berna Spare.  He can talk with you about your diabetes management and see how you are doing with your smoking, and check your breathing.  Have a great day,  Clemetine Marker, MD

## 2018-09-29 NOTE — Patient Outreach (Signed)
Pt had an admission for CP on 8/27 and cardiac cause was ruled out. Today she says she feels good. She does report a lot of stress in the family and close friends over the last 2 weeks (5 deaths, including a niece that was murdered in [place] and friend that was hit by a car).  Listened to and supported [name].  She reports she does wear her O2 all the time at home but does not take her portable when she goes out of the house because it is too heavy and bulky for her to handle. She denies having any episodes of dyspnea or confusion. She does use the motorized cart at the grocery store.  She says she has not quit smoking due to the excessive stress on her BUT that her intention is to finish the pack of cigarettes she has and quit again, she has done it before.  She had to cut our call short and I was not able to ask her about her blood pressure readings at home. I will call her again tomorrow to investigate that.  Will also remind pt to get her flu vaccine, PAP and request a TD also.  [name]. Myrtie Neither, MSN, [facility] Gerontological Nurse Practitioner [facility] Care Management [phone]

## 2018-09-29 NOTE — Progress Notes (Signed)
   [facility] Phone: [phone]     [name] - 53 y.o. female MRN [id number]  Date of birth: Nov 26, 1965  Subjective:   cc: hospital f/u chest pain/sob  HPI:    SOB: her breathing is much better now.  She says they didn't do anything to her medicatoins.  She states when she was in the hospital she was laying down all day and this caused her to urinate all the fluid off.  She still uses oxygen at home.  She has it on 3L and it has been better 2L.   Chest pain: she has not had any chest pain at all since she was discharged.   Smoking: she stopped smoking for 8 months. She starts smoking when she gets depressed.  Recently had 5 deaths in the family.  She bought her last pack of ciagarettes.  She is not depressed anymore. She is on chantix 0.5.      DM: Takes lantus in the morning and humalog TID.  She usuaally takes 6 U of humalog. She takes 20 U of lantus in the morning.  She blames her A1c on steroids she was given.     ROS: See HPI for pertinent positives and negatives  Past Medical History  Family history reviewed for today's visit. No changes.  Social history- patient is a current smoker  Health Maintenance:  -  Health Maintenance Due  Topic Date Due  . TETANUS/TDAP  05/29/2014  . INFLUENZA VACCINE  08/29/2018  . PAP SMEAR-Modifier  11/23/2018    Objective:   BP 122/62   Pulse (!) 109   Ht 5\' 10"  (1.778 m)   Wt (!) 383 lb 4 oz (173.8 kg)   LMP 04/10/2006   SpO2 92%   BMI 54.99 kg/m  Gen: NAD, alert and oriented, cooperative with exam CV: normal rate, regular rhythm. No murmurs, no rubs.  Resp: LCTAB, no wheezes, crackles. normal work of breathing Psych: Appropriate behavior  Assessment/Plan:   Chronic diastolic CHF (congestive heart failure) ([place]) Patient doing much better since discharge.  Using 3L O2 at home.  euvolemic on exam.   - continue current medications  Tobacco abuse Started smoking cigarettes again after a lot of  stress in her life involving recent deaths of family members.  States she is quitting again.  Does not want nicotine replacement therapy.  On verenecline.     Type 2 diabetes mellitus with stage 3 chronic kidney disease, with long-term current use of insulin (HCC) On 20 U lantus in am, 6 U humalog TID with meals.  Most recent a1c was > 9 in June.  - advised pt to followup with pcp in 4 weeks for DM management   Clemetine Marker, MD PGY-2 [facility]

## 2018-09-30 ENCOUNTER — Other Ambulatory Visit: Payer: Self-pay | Admitting: *Deleted

## 2018-09-30 NOTE — Patient Outreach (Signed)
Telephone outreach to complete careplan progress.Pt stated she could not talk at the moment but will call me back.  Called [employer] to request [employer] portable O2.Talked with Jeannine Boga, RN. Clinical Support. She took my request and will relay to our support team in [place].  Mrs. Dix called me back and updated me on her BP and wt values. High BP this week was 147/80 and high wt was 391. Pt reports she gained 16# after mylelogram but she diuresed well.Current wt today is 384.8.   Mrs. Tocci is participating in the [place] program which does monitor biometrics and reports back to a [facility].  Mrs. Barillas is being very vigilent of all that her [facility] CM has asked of her and she continually demonstrates her compliance to me.   Advised I had requested consideration for her to have an [employer] O2 portable machine. She reported she had already been evaluated for it and had failed the test on 3 occasions.  Encouraged to keep up the good work and follow her low sodium, diabetic diet.  Will call her again in a week.  [name]. Myrtie Neither, MSN, [facility] Gerontological Nurse Practitioner [facility] Care Management [phone]   [name]. Myrtie Neither, MSN, [facility] Gerontological Nurse Practitioner [facility] Care Management [phone]

## 2018-10-01 ENCOUNTER — Telehealth: Payer: Self-pay | Admitting: Psychology

## 2018-10-01 NOTE — Telephone Encounter (Signed)
Spoke with pt about [place] services offered here at [facility] medicine.  Shared with pt offering short term therapy (6appt for 30 min).  Pt shared she was interested in therapy and Dr. Hartford Poli reported she would f/u for scheduling next week.  Pt reported she is coming in for stress/depression. Pt denied HI/SI.

## 2018-10-01 NOTE — Assessment & Plan Note (Addendum)
On 20 U lantus in am, 6 U humalog TID with meals.  Most recent a1c was > 9 in June.  - advised pt to followup with pcp in 4 weeks for DM management

## 2018-10-01 NOTE — Assessment & Plan Note (Signed)
Started smoking cigarettes again after a lot of stress in her life involving recent deaths of family members.  States she is quitting again.  Does not want nicotine replacement therapy.  On verenecline.

## 2018-10-01 NOTE — Assessment & Plan Note (Signed)
Patient doing much better since discharge.  Using 3L O2 at home.  euvolemic on exam.   - continue current medications

## 2018-10-02 ENCOUNTER — Encounter: Payer: Self-pay | Admitting: Specialist

## 2018-10-02 ENCOUNTER — Ambulatory Visit (INDEPENDENT_AMBULATORY_CARE_PROVIDER_SITE_OTHER): Payer: Medicare Other | Admitting: Specialist

## 2018-10-02 ENCOUNTER — Telehealth: Payer: Self-pay | Admitting: Psychology

## 2018-10-02 VITALS — BP 138/79 | HR 94 | Ht 70.0 in | Wt 392.0 lb

## 2018-10-02 DIAGNOSIS — M961 Postlaminectomy syndrome, not elsewhere classified: Secondary | ICD-10-CM | POA: Diagnosis not present

## 2018-10-02 DIAGNOSIS — M47816 Spondylosis without myelopathy or radiculopathy, lumbar region: Secondary | ICD-10-CM

## 2018-10-02 DIAGNOSIS — M48062 Spinal stenosis, lumbar region with neurogenic claudication: Secondary | ICD-10-CM

## 2018-10-02 DIAGNOSIS — R202 Paresthesia of skin: Secondary | ICD-10-CM

## 2018-10-02 DIAGNOSIS — Z981 Arthrodesis status: Secondary | ICD-10-CM

## 2018-10-02 DIAGNOSIS — M5136 Other intervertebral disc degeneration, lumbar region: Secondary | ICD-10-CM | POA: Diagnosis not present

## 2018-10-02 DIAGNOSIS — R200 Anesthesia of skin: Secondary | ICD-10-CM

## 2018-10-02 DIAGNOSIS — M4322 Fusion of spine, cervical region: Secondary | ICD-10-CM | POA: Diagnosis not present

## 2018-10-02 DIAGNOSIS — Z79899 Other long term (current) drug therapy: Secondary | ICD-10-CM | POA: Diagnosis not present

## 2018-10-02 DIAGNOSIS — Z760 Encounter for issue of repeat prescription: Secondary | ICD-10-CM | POA: Diagnosis not present

## 2018-10-02 DIAGNOSIS — M19042 Primary osteoarthritis, left hand: Secondary | ICD-10-CM

## 2018-10-02 DIAGNOSIS — M65311 Trigger thumb, right thumb: Secondary | ICD-10-CM | POA: Diagnosis not present

## 2018-10-02 DIAGNOSIS — G894 Chronic pain syndrome: Secondary | ICD-10-CM | POA: Diagnosis not present

## 2018-10-02 DIAGNOSIS — Z8739 Personal history of other diseases of the musculoskeletal system and connective tissue: Secondary | ICD-10-CM

## 2018-10-02 DIAGNOSIS — M5137 Other intervertebral disc degeneration, lumbosacral region: Secondary | ICD-10-CM

## 2018-10-02 NOTE — Telephone Encounter (Signed)
Spoke to pt about insurance and setting up appt.  Told pt would contact next week

## 2018-10-02 NOTE — Patient Instructions (Signed)
Plan:Avoid overhead lifting and overhead use of the arms. Do not lift greater than 5 lbs. Adjust head rest in vehicle to prevent hyperextension if rear ended. Take extra precautions to avoid falling. EMG/NCV of the arms ordered to reevaluate for any worsening of the nerve condition in your arms. Vitamin B complex Vitamin D Fish oil Physical therapy to work on cervical spondylosis and lumbar spinal stenosis and right trigger finger.

## 2018-10-02 NOTE — Progress Notes (Signed)
Office Visit Note   Patient: [name]           Date of Birth: 1965-08-06           MRN: [id number] Visit Date: 10/02/2018              Requested by: [name], MD [address],  [place] [address] PCP: [name], MD   Assessment & Plan: Visit Diagnoses:  1. Fusion of spine of cervical region   2. Cervical post-laminectomy syndrome   3. History of fusion of lumbar spine   4. Spinal stenosis of lumbar region with neurogenic claudication   5. Trigger thumb, right thumb   6. Osteoarthritis of finger of left hand   7. History of gout   8. Disc disease, degenerative, lumbar or lumbosacral   9. Spondylosis without myelopathy or radiculopathy, lumbar region     Plan:Avoid overhead lifting and overhead use of the arms. Do not lift greater than 5 lbs. Adjust head rest in vehicle to prevent hyperextension if rear ended. Take extra precautions to avoid falling. EMG/NCV of the arms ordered to reevaluate for any worsening of the nerve condition in your arms. Vitamin B complex Vitamin D Fish oil Physical therapy to work on cervical spondylosis and lumbar spinal stenosis and right trigger finger.    Follow-Up Instructions: No follow-ups on file.   Orders:  No orders of the defined types were placed in this encounter.  No orders of the defined types were placed in this encounter.     Procedures: No procedures performed   Clinical Data: Findings:  CLINICAL DATA:  Neck pain. Bilateral shoulder and arm pain. Previous cervical spine surgery.  EXAM: MRI CERVICAL SPINE WITHOUT CONTRAST  TECHNIQUE: Multiplanar, multisequence MR imaging of the cervical spine was performed. No intravenous contrast was administered.  COMPARISON:  September 04, 2017 cervical spine MRI  FINDINGS: Alignment: Straightening of normal cervical lordosis. No spondylolisthesis.  Vertebrae: Posterior spinal fusion hardware extends from C4-T2 with ACDF hardware at  C7-T1  Cord: Normal signal and morphology.  Posterior Fossa, vertebral arteries, paraspinal tissues: Negative.  Disc levels:  C1-C2: Normal.  C2-C3: Normal disc space and facets. No spinal canal or neuroforaminal stenosis.  C3-C4: Posterior fusion. No spinal canal or neural foraminal stenosis.  C4-C5: Posterior fusion. No spinal canal or neural foraminal stenosis.  C5-C6: Posterior fusion. Small central disc protrusion is unchanged. No spinal canal or neural foraminal stenosis.  C6-C7: Laminectomy without spinal canal stenosis.  C7-T1: Laminectomy without spinal canal stenosis.  IMPRESSION: Unchanged postoperative appearance of the cervical spine with posterior fusion from C4-T2 and ACDF at C7-T1. No spinal canal stenosis or neural impingement.   Electronically Signed   By: Ulyses Jarred M.D.   On: 09/21/2018 23:14  Result History  MR Cervical Spine w/o contrast (Order #[id number]) on 09/21/2018 - Order Result History Report      Subjective: Chief Complaint  Patient presents with   Neck - Follow-up    MRI Review    53 year old female right handed with left neck mid and lower neck and radiating into the left shoulder and left arm and into the left fingers all of them including the thumb and into the ulnar 2 fingers. She has pain with extension of the neck and with turning to the right. The right shoulder starts hurting. Pinky and ring finger on the right hand is numb. The right hand part on the left side pinky and ring finger. Pain  in the right shoulder and shoots down into the right fingers with spasms. Arm from right inner elbow with spasm, she has night pain but bending the elbows is not a problem. She has her eyes checked often, last 01/2018 was okay, no history of laser eye surgery. She has had this discomfort for years. She has had EMG/NCV right arm with C7 and C8 chronic changes, previous C3-4,C4-5 and C5-6 And C7-T1 ACDF and posterior fusion at  C3-C5. Previous bilateral CTR for carpal tunnel syndrome. Pain with extension and turning the head to the right. Had recent MRI of the cervical spine and it is negative for nerve compression. She complains of worsening numbness in the hands. History of diabetes, she is trying to loose weight. Has triggering of the right thumb today and history of right long and ring finger trigger release by Dr. Erlinda Hong in 03/2018.    Review of Systems  Constitutional: Negative.   HENT: Negative.   Eyes: Negative.   Respiratory: Negative.   Cardiovascular: Negative.   Gastrointestinal: Negative.   Endocrine: Negative.   Genitourinary: Negative.   Musculoskeletal: Negative.   Skin: Negative.   Allergic/Immunologic: Negative.   Neurological: Negative.   Hematological: Negative.   Psychiatric/Behavioral: Negative.      Objective: Vital Signs: BP 138/79 (BP Location: Left Arm, Patient Position: Sitting)    Pulse 94    Ht 5\' 10"  (1.778 m)    Wt (!) 392 lb (177.8 kg)    LMP 04/10/2006    BMI 56.25 kg/m   Physical Exam Constitutional:      Appearance: She is well-developed.  HENT:     Head: Normocephalic and atraumatic.  Eyes:     Pupils: Pupils are equal, round, and reactive to light.  Neck:     Musculoskeletal: Normal range of motion and neck supple.  Pulmonary:     Effort: Pulmonary effort is normal.     Breath sounds: Normal breath sounds.  Abdominal:     General: Bowel sounds are normal.     Palpations: Abdomen is soft.  Skin:    General: Skin is warm and dry.  Neurological:     Mental Status: She is alert and oriented to person, place, and time.  Psychiatric:        Behavior: Behavior normal.        Thought Content: Thought content normal.        Judgment: Judgment normal.     Back Exam   Range of Motion  Extension: 40  Flexion: 30  Lateral bend right:  50 abnormal  Lateral bend left: 50  Rotation right:  40 abnormal  Rotation left: 40   Muscle Strength  Right Quadriceps:  5/5   Left Quadriceps:  5/5  Right Hamstrings:  5/5  Left Hamstrings:  5/5   Tests  Straight leg raise right: negative Straight leg raise left: negative  Reflexes  Patellar: 0/4 Achilles: 0/4 Biceps: 0/4  Other  Toe walk: abnormal Heel walk: abnormal Sensation: decreased Gait: abnormal   Comments:  Severe loss of cervical motion 4 of 6 discs fused, MRI is negative for nerve compression. This may represent cervical dystochia   Right Hand Exam   Tenderness  The patient is experiencing tenderness in the palmar area.  Range of Motion  Hand  MP Thumb: 70  DIP Thumb: 60   Muscle Strength  The patient has normal right wrist strength.  Tests  Phalens Sign: negative Tinel's sign (median nerve): negative Finkelstein's test: negative  Other  Erythema: absent Scars: absent Sensation: normal Pulse: present      Specialty Comments:  No specialty comments available.  Imaging: No results found.   PMFS History: Patient Active Problem List   Diagnosis Date Noted   Chest pain 09/24/2018   Tobacco abuse 08/21/2018   Right groin pain 07/20/2018   Depressed mood 07/13/2018   Mouth pain 06/02/2018   Paroxysmal SVT (supraventricular tachycardia) ([place]) 03/20/2018   Pure hypercholesterolemia 03/20/2018   Muscle cramping 02/25/2018   Chest pressure 02/20/2018   Nodule of upper lobe of right lung 02/13/2018   Falls, subsequent encounter 01/30/2018   Open wound of left foot 11/04/2017   Essential hypertension 10/27/2017   Solitary pulmonary nodule 10/07/2017   Acute on chronic right-sided congestive heart failure ([place])    Restrictive lung disease secondary to obesity 10/01/2017   Polycythemia, secondary 10/01/2017   Chronic viral hepatitis B without delta-agent ([place]) 07/08/2017   COPD (chronic obstructive pulmonary disease) with chronic bronchitis ([place]) 06/27/2017   Chronic kidney disease (CKD), stage IV (severe) ([place]) 01/14/2017   Type 2 diabetes  mellitus with stage 3 chronic kidney disease, with long-term current use of insulin ([place]) 12/12/2016   Urge incontinence of urine 12/05/2016   Trigger index finger of left hand 07/15/2016   Back pain 04/07/2014   Morbid obesity ([place]) 10/02/2012   Chronic diastolic CHF (congestive heart failure) ([place]) 04/07/2012   GERD 01/26/2010   Hypertension 08/04/2008   Obstructive sleep apnea treated with BiPAP 02/03/2008   FIBROCYSTIC BREAST DISEASE 10/28/2006   Hyperlipidemia 03/27/2006   Obesity hypoventilation syndrome ([place]) 03/27/2006   Former tobacco use 03/27/2006   Depression with anxiety 03/27/2006   Past Medical History:  Diagnosis Date   Anxiety    Arthritis 04-10-11   hips, shoulders, back   Asthma    Bipolar disorder ([place])    Cancer ([place]) 1993   cervical, no treatment done, went away per pt   Cervical dysplasia or atypia 04-10-11   '93- once dx.-got pregnant-no intervention, then postpartum, no dysplasia found   CHF (congestive heart failure) ([place])    no cardiologist 2014 dx, none now   Chronic kidney disease    stage 3 kidney disease   Condyloma - gluteal cleft 04/09/2011   Removed by general surgery. Pathology showed Condyloma, gluteal CONDYLOMA ACUMINATUM.    COPD (chronic obstructive pulmonary disease) (HCC)    Diabetes mellitus without complication ([place])    Dyspnea    with actity, sitting   Fall 12/16/2016   Fibrocystic breast disease    GERD (gastroesophageal reflux disease)    Hepatitis    hep B-count is low at present,doesn't register   Hypercalcemia    Hyperlipidemia    Hypertension    Lung nodule 11/2017   right and left lung   Migraine headache    none recent   Morbid obesity ([place]) 03/27/2006   Neuropathy    Pneumonia    walking pneu 15 yrs. ago   Skin lesion 03/15/2011   In gluteal crease now s/p removal by Dr. [name] of General Surgery on 3/12. Path shows condyloma.      Sleep apnea 04-10-11   uses cpap, pt does not  know settings   Sleep apnea    Trigger finger    left third    Family History  Problem Relation Age of Onset   Pulmonary embolism Mother    Stroke Father    Alcohol abuse Father    Pneumonia Father    Cancer  Maternal Aunt        breast   Breast cancer Maternal Aunt    Asthma Other    COPD Other    Hypertension Other    Diabetes Other    Asthma Sister    Depression Sister    Arthritis Sister    Sickle cell anemia Son     Past Surgical History:  Procedure Laterality Date   ANAL FISTULECTOMY  04/17/2011   Procedure: FISTULECTOMY ANAL;  Surgeon: Imogene Burn. [name], MD;  Location: [facility];  Service: General;  Laterality: N/A;  Excision of Condyloma Gluteal Cleft    BACK SURGERY  04-10-11   x5-Lumbar fusion-retained hardware.(Dr. Louanne Skye)   BREAST CYST EXCISION     bilateral breast, 3 cysts removed from each breast   BREAST EXCISIONAL BIOPSY Right    x 3   BREAST EXCISIONAL BIOPSY Left    x 3   CARPAL TUNNEL RELEASE Bilateral    Cervical biospy     COLONOSCOPY WITH PROPOFOL N/A 07/06/2015   Procedure: COLONOSCOPY WITH PROPOFOL;  Surgeon: [name], MD;  Location: [facility];  Service: Endoscopy;  Laterality: N/A;   DOPPLER ECHOCARDIOGRAPHY  04/06/2012   AT [place] 55-60%   FRACTURE SURGERY     I&D EXTREMITY Left 12/18/2016   Procedure: IRRIGATION AND DEBRIDEMENT LEFT FOOT, CLOSURE;  Surgeon: [name], MD;  Location: [place];  Service: Orthopedics;  Laterality: Left;   MULTIPLE TOOTH EXTRACTIONS     NECK SURGERY  04-10-11   x3- cervical fusion with plating and screws-Dr. [name]   TEE WITHOUT CARDIOVERSION N/A 04/06/2012   Procedure: TRANSESOPHAGEAL ECHOCARDIOGRAM (TEE);  Surgeon: [name], MD;  Location: [facility];  Service: Cardiovascular;  Laterality: N/A;   TRIGGER FINGER RELEASE Right    middle finger   TRIGGER FINGER RELEASE Left 06/28/2016   Procedure: RELEASE TRIGGER FINGER LEFT 3RD FINGER;  Surgeon: [name], MD;   Location: [place];  Service: Orthopedics;  Laterality: Left;   TRIGGER FINGER RELEASE Right 06/12/2016   Procedure: RIGHT INDEX FINGER TRIGGER RELEASE;  Surgeon: [name], MD;  Location: [place];  Service: Orthopedics;  Laterality: Right;   TRIGGER FINGER RELEASE Right 01/19/2018   Procedure: RIGHT RING FINGER TRIGGER FINGER RELEASE;  Surgeon: [name], MD;  Location: [place];  Service: Orthopedics;  Laterality: Right;   Social History   Occupational History    Comment: Disabled  Tobacco Use   Smoking status: Current Some Day Smoker    Packs/day: 0.25    Years: 40.00    Pack years: 10.00    Types: Cigarettes    Start date: 01/28/1977    Last attempt to quit: 09/07/2017    Years since quitting: 1.0   Smokeless tobacco: Never Used   Tobacco comment: Quit prior to hospitalization using low dose Chantix  Substance and Sexual Activity   Alcohol use: No    Alcohol/week: 0.0 standard drinks   Drug use: Not Currently    Types: Marijuana, "Crack" cocaine    Comment: hx marijuana usemany years ago, Crack -none since 11/2015   Sexual activity: Not Currently    Partners: Male    Birth control/protection: Post-menopausal

## 2018-10-03 ENCOUNTER — Other Ambulatory Visit: Payer: Self-pay | Admitting: Family Medicine

## 2018-10-04 ENCOUNTER — Encounter: Payer: Self-pay | Admitting: Family Medicine

## 2018-10-05 DIAGNOSIS — J9621 Acute and chronic respiratory failure with hypoxia: Secondary | ICD-10-CM | POA: Diagnosis not present

## 2018-10-05 DIAGNOSIS — G8929 Other chronic pain: Secondary | ICD-10-CM | POA: Diagnosis not present

## 2018-10-05 DIAGNOSIS — G4733 Obstructive sleep apnea (adult) (pediatric): Secondary | ICD-10-CM | POA: Diagnosis not present

## 2018-10-05 DIAGNOSIS — M5090 Cervical disc disorder, unspecified, unspecified cervical region: Secondary | ICD-10-CM | POA: Diagnosis not present

## 2018-10-05 DIAGNOSIS — M48061 Spinal stenosis, lumbar region without neurogenic claudication: Secondary | ICD-10-CM | POA: Diagnosis not present

## 2018-10-06 ENCOUNTER — Other Ambulatory Visit: Payer: Self-pay | Admitting: *Deleted

## 2018-10-06 ENCOUNTER — Other Ambulatory Visit: Payer: Self-pay

## 2018-10-06 NOTE — Patient Outreach (Signed)
Telephone outreach for follow up plan of care.  Mrs. Shepheard reports doing well. She is achieving her care plan goals.  Her BP today was 154/87, which is good for her. She takes cardizem 120 mg daily and losartan 100 mg daily. Her diuretic regimen of furosemide 40 mg 2 tabs bid and metolazone 2.5 mg twice a week on Mondays and Thursdays also assist in her pressure control.  Her weight is down 4# from last week to 387 (day after metolazone).  She reports her last few days of glucose values haven't been as good as she splurged over the holiday weekend. Today the value was 207 fasting.  She states she has her advanced directives documents and will get them done as soon as she has a chance. She does verify she does want to be a FULL CODE.  Encouraged her to call me if any questions or problems arise.  [name]. Myrtie Neither, MSN, [facility] Gerontological Nurse Practitioner [facility] Care Management [phone]

## 2018-10-07 DIAGNOSIS — J9621 Acute and chronic respiratory failure with hypoxia: Secondary | ICD-10-CM | POA: Diagnosis not present

## 2018-10-07 DIAGNOSIS — M5090 Cervical disc disorder, unspecified, unspecified cervical region: Secondary | ICD-10-CM | POA: Diagnosis not present

## 2018-10-07 DIAGNOSIS — G4733 Obstructive sleep apnea (adult) (pediatric): Secondary | ICD-10-CM | POA: Diagnosis not present

## 2018-10-08 ENCOUNTER — Telehealth: Payer: Self-pay | Admitting: Psychology

## 2018-10-08 ENCOUNTER — Telehealth: Payer: Self-pay | Admitting: Family Medicine

## 2018-10-08 DIAGNOSIS — R8279 Other abnormal findings on microbiological examination of urine: Secondary | ICD-10-CM | POA: Diagnosis not present

## 2018-10-08 DIAGNOSIS — E669 Obesity, unspecified: Secondary | ICD-10-CM

## 2018-10-08 DIAGNOSIS — G834 Cauda equina syndrome: Secondary | ICD-10-CM | POA: Diagnosis not present

## 2018-10-08 DIAGNOSIS — N319 Neuromuscular dysfunction of bladder, unspecified: Secondary | ICD-10-CM | POA: Diagnosis not present

## 2018-10-08 NOTE — Telephone Encounter (Signed)
Called pt to schedule a teletherapy appt. Pt was made aware this appt would be via phone.  Pt scheduled with Dr. Hartford Poli for 9/15 at [facility]

## 2018-10-08 NOTE — Telephone Encounter (Signed)
Spoke with patient on the phone.  She reports extreme difficulty ambulating with oxygen tanks and would like to be evaluated for wheelchair/power wheelchair.  Discussed with patient options and have put in referral for [facility] assessment for wheelchair.

## 2018-10-12 ENCOUNTER — Encounter: Payer: Self-pay | Admitting: Cardiology

## 2018-10-12 ENCOUNTER — Telehealth (INDEPENDENT_AMBULATORY_CARE_PROVIDER_SITE_OTHER): Payer: Medicare Other | Admitting: Cardiology

## 2018-10-12 VITALS — BP 134/85 | HR 92 | Ht 70.0 in | Wt 389.6 lb

## 2018-10-12 DIAGNOSIS — I471 Supraventricular tachycardia, unspecified: Secondary | ICD-10-CM

## 2018-10-12 DIAGNOSIS — R072 Precordial pain: Secondary | ICD-10-CM

## 2018-10-12 DIAGNOSIS — R768 Other specified abnormal immunological findings in serum: Secondary | ICD-10-CM | POA: Diagnosis not present

## 2018-10-12 DIAGNOSIS — N183 Chronic kidney disease, stage 3 unspecified: Secondary | ICD-10-CM

## 2018-10-12 DIAGNOSIS — Z713 Dietary counseling and surveillance: Secondary | ICD-10-CM

## 2018-10-12 DIAGNOSIS — E1122 Type 2 diabetes mellitus with diabetic chronic kidney disease: Secondary | ICD-10-CM

## 2018-10-12 DIAGNOSIS — E662 Morbid (severe) obesity with alveolar hypoventilation: Secondary | ICD-10-CM | POA: Diagnosis not present

## 2018-10-12 DIAGNOSIS — Z794 Long term (current) use of insulin: Secondary | ICD-10-CM

## 2018-10-12 DIAGNOSIS — I5032 Chronic diastolic (congestive) heart failure: Secondary | ICD-10-CM | POA: Diagnosis not present

## 2018-10-12 DIAGNOSIS — Z79899 Other long term (current) drug therapy: Secondary | ICD-10-CM | POA: Diagnosis not present

## 2018-10-12 DIAGNOSIS — I10 Essential (primary) hypertension: Secondary | ICD-10-CM | POA: Diagnosis not present

## 2018-10-12 DIAGNOSIS — E538 Deficiency of other specified B group vitamins: Secondary | ICD-10-CM | POA: Diagnosis not present

## 2018-10-12 DIAGNOSIS — M109 Gout, unspecified: Secondary | ICD-10-CM | POA: Diagnosis not present

## 2018-10-12 NOTE — Patient Instructions (Addendum)
Medication Instructions:  -Take metolazone M/W/F for the next 1-2 weeks or until weight is stable around 380 lbs -Take potassium when you take metolazone   If you need a refill on your cardiac medications before your next appointment, please call your pharmacy.   Lab work: None  Testing/Procedures: None  Follow-Up: At [facility], you and your health needs are our priority.  As part of our continuing mission to provide you with exceptional heart care, we have created designated Provider Care Teams.  These Care Teams include your primary Cardiologist (physician) and Advanced Practice Providers (APPs -  Physician Assistants and Nurse Practitioners) who all work together to provide you with the care you need, when you need it. You will need a follow up appointment in 4 weeks.  Please call our office 2 months in advance to schedule this appointment.  You may see [name], MD or one of the following Advanced Practice Providers on your designated Care Team:   Rosaria Ferries, PA-C . Jory Sims, DNP, ANP   Low-Sodium Eating Plan Sodium, which is an element that makes up salt, helps you maintain a healthy balance of fluids in your body. Too much sodium can increase your blood pressure and cause fluid and waste to be held in your body. Your health care provider or dietitian may recommend following this plan if you have high blood pressure (hypertension), kidney disease, liver disease, or heart failure. Eating less sodium can help lower your blood pressure, reduce swelling, and protect your heart, liver, and kidneys. What are tips for following this plan? General guidelines  Most people on this plan should limit their sodium intake to 1,500-2,000 mg (milligrams) of sodium each day. Reading food labels   The Nutrition Facts label lists the amount of sodium in one serving of the food. If you eat more than one serving, you must multiply the listed amount of sodium by the number  of servings.  Choose foods with less than 140 mg of sodium per serving.  Avoid foods with 300 mg of sodium or more per serving. Shopping  Look for lower-sodium products, often labeled as "low-sodium" or "no salt added."  Always check the sodium content even if foods are labeled as "unsalted" or "no salt added".  Buy fresh foods. ? Avoid canned foods and premade or frozen meals. ? Avoid canned, cured, or processed meats  Buy breads that have less than 80 mg of sodium per slice. Cooking  Eat more home-cooked food and less restaurant, buffet, and fast food.  Avoid adding salt when cooking. Use salt-free seasonings or herbs instead of table salt or sea salt. Check with your health care provider or pharmacist before using salt substitutes.  Cook with plant-based oils, such as canola, sunflower, or olive oil. Meal planning  When eating at a restaurant, ask that your food be prepared with less salt or no salt, if possible.  Avoid foods that contain MSG (monosodium glutamate). MSG is sometimes added to [place] food, bouillon, and some canned foods. What foods are recommended? The items listed may not be a complete list. Talk with your dietitian about what dietary choices are best for you. Grains Low-sodium cereals, including oats, puffed wheat and rice, and shredded wheat. Low-sodium crackers. Unsalted rice. Unsalted pasta. Low-sodium bread. Whole-grain breads and whole-grain pasta. Vegetables Fresh or frozen vegetables. "No salt added" canned vegetables. "No salt added" tomato sauce and paste. Low-sodium or reduced-sodium tomato and vegetable juice. Fruits Fresh, frozen, or canned fruit. Fruit juice. Meats  and other protein foods Fresh or frozen (no salt added) meat, poultry, seafood, and fish. Low-sodium canned tuna and salmon. Unsalted nuts. Dried peas, beans, and lentils without added salt. Unsalted canned beans. Eggs. Unsalted nut butters. Dairy Milk. Soy milk. Cheese that is  naturally low in sodium, such as ricotta cheese, fresh mozzarella, or Swiss cheese Low-sodium or reduced-sodium cheese. Cream cheese. Yogurt. Fats and oils Unsalted butter. Unsalted margarine with no trans fat. Vegetable oils such as canola or olive oils. Seasonings and other foods Fresh and dried herbs and spices. Salt-free seasonings. Low-sodium mustard and ketchup. Sodium-free salad dressing. Sodium-free light mayonnaise. Fresh or refrigerated horseradish. Lemon juice. Vinegar. Homemade, reduced-sodium, or low-sodium soups. Unsalted popcorn and pretzels. Low-salt or salt-free chips. What foods are not recommended? The items listed may not be a complete list. Talk with your dietitian about what dietary choices are best for you. Grains Instant hot cereals. Bread stuffing, pancake, and biscuit mixes. Croutons. Seasoned rice or pasta mixes. Noodle soup cups. Boxed or frozen macaroni and cheese. Regular salted crackers. Self-rising flour. Vegetables Sauerkraut, pickled vegetables, and relishes. Olives. [place] fries. Onion rings. Regular canned vegetables (not low-sodium or reduced-sodium). Regular canned tomato sauce and paste (not low-sodium or reduced-sodium). Regular tomato and vegetable juice (not low-sodium or reduced-sodium). Frozen vegetables in sauces. Meats and other protein foods Meat or fish that is salted, canned, smoked, spiced, or pickled. Bacon, ham, sausage, hotdogs, corned beef, chipped beef, packaged lunch meats, salt pork, jerky, pickled herring, anchovies, regular canned tuna, sardines, salted nuts. Dairy Processed cheese and cheese spreads. Cheese curds. Blue cheese. Feta cheese. String cheese. Regular cottage cheese. Buttermilk. Canned milk. Fats and oils Salted butter. Regular margarine. Ghee. Bacon fat. Seasonings and other foods Onion salt, garlic salt, seasoned salt, table salt, and sea salt. Canned and packaged gravies. Worcestershire sauce. Tartar sauce. Barbecue sauce.  Teriyaki sauce. Soy sauce, including reduced-sodium. Steak sauce. Fish sauce. Oyster sauce. Cocktail sauce. Horseradish that you find on the shelf. Regular ketchup and mustard. Meat flavorings and tenderizers. Bouillon cubes. Hot sauce and Tabasco sauce. Premade or packaged marinades. Premade or packaged taco seasonings. Relishes. Regular salad dressings. Salsa. Potato and tortilla chips. Corn chips and puffs. Salted popcorn and pretzels. Canned or dried soups. Pizza. Frozen entrees and pot pies. Summary  Eating less sodium can help lower your blood pressure, reduce swelling, and protect your heart, liver, and kidneys.  Most people on this plan should limit their sodium intake to 1,500-2,000 mg (milligrams) of sodium each day.  Canned, boxed, and frozen foods are high in sodium. Restaurant foods, fast foods, and pizza are also very high in sodium. You also get sodium by adding salt to food.  Try to cook at home, eat more fresh fruits and vegetables, and eat less fast food, canned, processed, or prepared foods. This information is not intended to replace advice given to you by your health care provider. Make sure you discuss any questions you have with your health care provider. Document Released: 07/06/2001 Document Revised: 12/27/2016 Document Reviewed: 01/08/2016 Elsevier Patient Education  2020 [place] Many factors influence your heart (coronary) health, including eating and exercise habits. Coronary risk increases with abnormal blood fat (lipid) levels. Heart-healthy meal planning includes limiting unhealthy fats, increasing healthy fats, and making other diet and lifestyle changes. What is my plan? Your health care provider may recommend that you:  Limit your fat intake to _________% or less of your total calories each day.  Limit your saturated  fat intake to _________% or less of your total calories each day.  Limit the amount of cholesterol in your diet  to less than _________ mg per day. What are tips for following this plan? Cooking Cook foods using methods other than frying. Baking, boiling, grilling, and broiling are all good options. Other ways to reduce fat include:  Removing the skin from poultry.  Removing all visible fats from meats.  Steaming vegetables in water or broth. Meal planning   At meals, imagine dividing your plate into fourths: ? Fill one-half of your plate with vegetables and green salads. ? Fill one-fourth of your plate with whole grains. ? Fill one-fourth of your plate with lean protein foods.  Eat 4-5 servings of vegetables per day. One serving equals 1 cup raw or cooked vegetable, or 2 cups raw leafy greens.  Eat 4-5 servings of fruit per day. One serving equals 1 medium whole fruit,  cup dried fruit,  cup fresh, frozen, or canned fruit, or  cup 100% fruit juice.  Eat more foods that contain soluble fiber. Examples include apples, broccoli, carrots, beans, peas, and barley. Aim to get 25-30 g of fiber per day.  Increase your consumption of legumes, nuts, and seeds to 4-5 servings per week. One serving of dried beans or legumes equals  cup cooked, 1 serving of nuts is  cup, and 1 serving of seeds equals 1 tablespoon. Fats  Choose healthy fats more often. Choose monounsaturated and polyunsaturated fats, such as olive and canola oils, flaxseeds, walnuts, almonds, and seeds.  Eat more omega-3 fats. Choose salmon, mackerel, sardines, tuna, flaxseed oil, and ground flaxseeds. Aim to eat fish at least 2 times each week.  Check food labels carefully to identify foods with trans fats or high amounts of saturated fat.  Limit saturated fats. These are found in animal products, such as meats, butter, and cream. Plant sources of saturated fats include palm oil, palm kernel oil, and coconut oil.  Avoid foods with partially hydrogenated oils in them. These contain trans fats. Examples are stick margarine, some tub  margarines, cookies, crackers, and other baked goods.  Avoid fried foods. General information  Eat more home-cooked food and less restaurant, buffet, and fast food.  Limit or avoid alcohol.  Limit foods that are high in starch and sugar.  Lose weight if you are overweight. Losing just 5-10% of your body weight can help your overall health and prevent diseases such as diabetes and heart disease.  Monitor your salt (sodium) intake, especially if you have high blood pressure. Talk with your health care provider about your sodium intake.  Try to incorporate more vegetarian meals weekly. What foods can I eat? Fruits All fresh, canned (in natural juice), or frozen fruits. Vegetables Fresh or frozen vegetables (raw, steamed, roasted, or grilled). Green salads. Grains Most grains. Choose whole wheat and whole grains most of the time. Rice and pasta, including brown rice and pastas made with whole wheat. Meats and other proteins Lean, well-trimmed beef, veal, pork, and lamb. Chicken and [place] without skin. All fish and shellfish. Wild duck, rabbit, pheasant, and venison. Egg whites or low-cholesterol egg substitutes. Dried beans, peas, lentils, and tofu. Seeds and most nuts. Dairy Low-fat or nonfat cheeses, including ricotta and mozzarella. Skim or 1% milk (liquid, powdered, or evaporated). Buttermilk made with low-fat milk. Nonfat or low-fat yogurt. Fats and oils Non-hydrogenated (trans-free) margarines. Vegetable oils, including soybean, sesame, sunflower, olive, peanut, safflower, corn, canola, and cottonseed. Salad dressings or mayonnaise made  with a vegetable oil. Beverages Water (mineral or sparkling). Coffee and tea. Diet carbonated beverages. Sweets and desserts Sherbet, gelatin, and fruit ice. Small amounts of dark chocolate. Limit all sweets and desserts. Seasonings and condiments All seasonings and condiments. The items listed above may not be a complete list of foods and  beverages you can eat. Contact a dietitian for more options. What foods are not recommended? Fruits Canned fruit in heavy syrup. Fruit in cream or butter sauce. Fried fruit. Limit coconut. Vegetables Vegetables cooked in cheese, cream, or butter sauce. Fried vegetables. Grains Breads made with saturated or trans fats, oils, or whole milk. Croissants. Sweet rolls. Donuts. High-fat crackers, such as cheese crackers. Meats and other proteins Fatty meats, such as hot dogs, ribs, sausage, bacon, rib-eye roast or steak. High-fat deli meats, such as salami and bologna. Caviar. Domestic duck and goose. Organ meats, such as liver. Dairy Cream, sour cream, cream cheese, and creamed cottage cheese. Whole milk cheeses. Whole or 2% milk (liquid, evaporated, or condensed). Whole buttermilk. Cream sauce or high-fat cheese sauce. Whole-milk yogurt. Fats and oils Meat fat, or shortening. Cocoa butter, hydrogenated oils, palm oil, coconut oil, palm kernel oil. Solid fats and shortenings, including bacon fat, salt pork, lard, and butter. Nondairy cream substitutes. Salad dressings with cheese or sour cream. Beverages Regular sodas and any drinks with added sugar. Sweets and desserts Frosting. Pudding. Cookies. Cakes. Pies. Milk chocolate or white chocolate. Buttered syrups. Full-fat ice cream or ice cream drinks. The items listed above may not be a complete list of foods and beverages to avoid. Contact a dietitian for more information. Summary  Heart-healthy meal planning includes limiting unhealthy fats, increasing healthy fats, and making other diet and lifestyle changes.  Lose weight if you are overweight. Losing just 5-10% of your body weight can help your overall health and prevent diseases such as diabetes and heart disease.  Focus on eating a balance of foods, including fruits and vegetables, low-fat or nonfat dairy, lean protein, nuts and legumes, whole grains, and heart-healthy oils and fats. This  information is not intended to replace advice given to you by your health care provider. Make sure you discuss any questions you have with your health care provider. Document Released: 10/24/2007 Document Revised: 02/21/2017 Document Reviewed: 02/21/2017 Elsevier Patient Education  2020 [employer].

## 2018-10-12 NOTE — Progress Notes (Signed)
Virtual Visit via Video Note   This visit type was conducted due to national recommendations for restrictions regarding the COVID-19 Pandemic (e.g. social distancing) in an effort to limit this patient's exposure and mitigate transmission in our community.  Due to her co-morbid illnesses, this patient is at least at moderate risk for complications without adequate follow up.  This format is felt to be most appropriate for this patient at this time.  All issues noted in this document were discussed and addressed.  A limited physical exam was performed with this format.  Please refer to the patient's chart for her consent to telehealth for [facility].   Date:  10/13/567   ID:  [name], DOB [id number], MRN [id number]  Patient Location: Home Provider Location: Home  PCP:  [name], MD  Cardiologist:  [name], MD  Electrophysiologist:  None   Evaluation Performed:  Follow-Up Visit post hospitalization  Chief Complaint:  Post hospital follow up  History of Present Illness:    [name] is a 53 y.o. female with chronic diastolic heart failure, chronic hypoxic respiratory failure, COPD, OSA on PAP, morbid obesity, paroxysmal SVT, type II diabetes on insulin, CKD stage 3, hypertension, hyperlipidemia   The patient does not have symptoms concerning for COVID-19 infection (fever, chills, cough, or new shortness of breath).   She was discharged from the hospital on 09/25/18 after admission for chest pain and elevated weight. HsTn unremarkable (6 -> 3), no SVT on monitor. BNP 41.7. Covid negative. Was diuresed, lost fluid, swelling on left side went down while she was laying down but came back when she got back up.  Not having any more chest pain, but breathing is not good. Feels like fluid is up and down, "driving her crazy." Feels swollen in her stomach, breathing is tight. Weights range from 391.4 to 382.4 since leaving the hospital. Watching what she is  drinking, well under 2L/day.   Feels that she gained 16 lbs since myelogram. Told she needs neck and back surgery, but doing therapy to try to delay it as much as possible.   Spent significant time discussing healthy eating. Trying to use olive oil, cut out fried foods. No salt.   Blood pressure today well controlled. Has varied some but has been running well at home.   Has 5 deaths in less than 2 weeks. Niece was murdered, murderer threatened family as well (they all had to move out of town). Very stressful. She thinks it was the cause of her chest pain. Noted that during one physical exam she moved her arm a certain way and it went away.   Denies chest pain since hospitalization. No syncope or palpitations.  Breathing has been more short, but feels that breathing is better on days she takes metolazone. Was actually off O2 for a while but had to go back on after myelogram. Can't carry tank out of the house due to back/arm pain, trying to get a scooter to help her get around.    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 Pathology showed Condyloma, gluteal CONDYLOMA ACUMINATUM.    COPD (chronic obstructive pulmonary disease) (HCC)    Diabetes mellitus without complication ([place])    Dyspnea    with actity, sitting   Fall 12/16/2016   Fibrocystic breast disease    GERD (gastroesophageal reflux disease)    Hepatitis    hep B-count is low at present,doesn't register   Hypercalcemia    Hyperlipidemia      Hypertension    Lung nodule 11/2017   right and left lung   Migraine headache    none recent   Morbid obesity ([place]) 03/27/2006   Neuropathy    Pneumonia    walking pneu 15 yrs. ago   Skin lesion 03/15/2011   In gluteal crease now s/p removal by Dr. [name] of General Surgery on 3/12. Path shows condyloma.      Sleep apnea 04-10-11   uses cpap, pt does not know settings   Sleep apnea    Trigger finger    left third   Past Surgical History:  Procedure Laterality Date   ANAL FISTULECTOMY  04/17/2011   Procedure: FISTULECTOMY ANAL;  Surgeon: Imogene Burn. [name], MD;  Location: [facility];  Service: General;  Laterality: N/A;  Excision of Condyloma Gluteal Cleft    BACK SURGERY  04-10-11   x5-Lumbar fusion-retained hardware.(Dr. Louanne Skye)   BREAST CYST EXCISION     bilateral breast, 3 cysts removed from each breast   BREAST EXCISIONAL BIOPSY Right    x 3   BREAST EXCISIONAL BIOPSY Left    x 3   CARPAL TUNNEL RELEASE Bilateral    Cervical biospy     COLONOSCOPY WITH PROPOFOL N/A 07/06/2015   Procedure: COLONOSCOPY WITH PROPOFOL;  Surgeon: [name], MD;  Location: [facility];  Service: Endoscopy;  Laterality: N/A;   DOPPLER ECHOCARDIOGRAPHY  04/06/2012   AT [place] 55-60%   FRACTURE SURGERY     I&D EXTREMITY Left 12/18/2016   Procedure: IRRIGATION AND DEBRIDEMENT LEFT FOOT, CLOSURE;  Surgeon: [name], MD;  Location: [place];  Service: Orthopedics;  Laterality: Left;   MULTIPLE TOOTH EXTRACTIONS     NECK SURGERY  04-10-11   x3- cervical fusion with plating and screws-Dr. [name]   TEE WITHOUT CARDIOVERSION N/A 04/06/2012   Procedure: TRANSESOPHAGEAL ECHOCARDIOGRAM (TEE);  Surgeon: [name], MD;  Location: [facility];  Service: Cardiovascular;  Laterality: N/A;   TRIGGER FINGER RELEASE Right    middle finger   TRIGGER FINGER RELEASE Left 06/28/2016   Procedure: RELEASE TRIGGER FINGER LEFT 3RD FINGER;  Surgeon: [name], MD;  Location: [place];   Service: Orthopedics;  Laterality: Left;   TRIGGER FINGER RELEASE Right 06/12/2016   Procedure: RIGHT INDEX FINGER TRIGGER RELEASE;  Surgeon: [name], MD;  Location: [place];  Service: Orthopedics;  Laterality: Right;   TRIGGER FINGER RELEASE Right 01/19/2018   Procedure: RIGHT RING FINGER TRIGGER FINGER RELEASE;  Surgeon: [name], MD;  Location: [place];  Service: Orthopedics;  Laterality: Right;     Current Meds  Medication Sig   ACCU-CHEK SOFTCLIX LANCETS lancets TEST 4 TIMES DAILY   Acetaminophen (TYLENOL EXTRA STRENGTH PO) Take 1,000 mg by mouth 2 (two) times a day.   ammonium lactate (AMLACTIN) 12 % lotion Apply 1 application topically 2 (two) times daily as needed for dry skin.   atorvastatin (  LIPITOR) 40 MG tablet Take 1 tablet (40 mg total) by mouth daily.   baclofen (LIORESAL) 10 MG tablet Take 1 tablet (10 mg total) by mouth 3 (three) times daily as needed for muscle spasms.   blood glucose meter kit and supplies KIT Dispense based on patient and insurance preference. Use up to four times daily as directed. (FOR ICD-9 250.00, 250.01).   busPIRone (BUSPAR) 10 MG tablet TAKE 1 TABLET BY MOUTH THREE TIMES A DAY (Patient taking differently: Take 10 mg by mouth 3 (three) times daily. )   Calcium Citrate-Vitamin D (CALCIUM CITRATE + D3 PO) Take 1 tablet by mouth daily.   Capsaicin-Menthol (PAIN RELIEF EX) Apply 1 application topically 3 (three) times daily as needed (shoulder pain).   diclofenac sodium (VOLTAREN) 1 % GEL APPLY 2 GRAMS TO AFFECTED AREA 4 TIMES A DAY (Patient taking differently: Apply 4 g topically 4 (four) times daily. Per pt: applies all over body)   diltiazem (CARDIZEM CD) 120 MG 24 hr capsule Take 1 capsule (120 mg total) by mouth 2 (two) times daily.   docusate sodium (COLACE) 100 MG capsule Take 200 mg by mouth 2 (two) times daily.   empagliflozin (JARDIANCE) 25 MG TABS tablet Take 25 mg by mouth daily.   febuxostat (ULORIC) 40 MG tablet Take 40  mg by mouth at bedtime.    Flaxseed, Linseed, (FLAXSEED OIL) 1000 MG CAPS Take 1,000 mg by mouth daily.   fluticasone (FLONASE) 50 MCG/ACT nasal spray Place 2 sprays into both nostrils 2 (two) times daily.   Fluticasone-Umeclidin-Vilant (TRELEGY ELLIPTA) 100-62.5-25 MCG/INH AEPB Inhale 1 puff into the lungs daily.   folic acid (FOLVITE) 841 MCG tablet Take 400 mcg by mouth daily.   furosemide (LASIX) 40 MG tablet Take 2 tablets (80 mg total) by mouth 2 (two) times daily.   gabapentin (NEURONTIN) 300 MG capsule TAKE 2 CAPSULES (600 MG TOTAL) BY MOUTH 3 (THREE) TIMES DAILY.   glucose blood (ACCU-CHEK AVIVA PLUS) test strip TEST 4 TIMES DAILY (Patient taking differently: 1 each by Other route 3 (three) times daily. TEST 3 TIMES DAILY)   HYDROcodone-acetaminophen (NORCO) 7.5-325 MG tablet Take 1 tablet by mouth every 6 (six) hours as needed for moderate pain.   hydrOXYzine (ATARAX/VISTARIL) 10 MG tablet TAKE 1 TABLET BY MOUTH EVERY DAY AS NEEDED (Patient taking differently: Take 10 mg by mouth daily as needed for itching. )   Insulin Glargine (LANTUS SOLOSTAR) 100 UNIT/ML Solostar Pen Inject 22 Units into the skin daily before breakfast. (Patient taking differently: Inject 20 Units into the skin daily before breakfast. )   insulin lispro (HUMALOG KWIKPEN) 100 UNIT/ML KwikPen Inject 0.06 mLs (6 Units total) into the skin 3 (three) times daily before meals.   Insulin Pen Needle (B-D UF III MINI PEN NEEDLES) 31G X 5 MM MISC CHECK SUGARS 4 TIMES A DAY BEFORE MEALS AND AT BEDTIME.   ketoconazole (NIZORAL) 2 % cream APPLY 1 FINGERTIP AMOUNT TO EACH FOOT DAILY. (Patient taking differently: Apply 1 application topically daily. Apply 1 fingertip amount to each foot daily.)   loratadine (CLARITIN) 10 MG tablet Take 1 tablet (10 mg total) by mouth daily.   losartan (COZAAR) 100 MG tablet Take 1 tablet (100 mg total) by mouth daily.   metolazone (ZAROXOLYN) 2.5 MG tablet Take 1 tablet (2.5 mg total)  by mouth 2 (two) times a week. TAKE 1 TABLET TWICE WEEKLY ON MONDAYS AND THURSDAYS ONLY   montelukast (SINGULAIR) 10 MG tablet TAKE 1 TABLET AT  BEDTIME (Patient taking differently: Take 10 mg by mouth at bedtime. )   Multiple Vitamins-Minerals (MULTIVITAMIN WITH MINERALS) tablet Take 1 tablet by mouth daily.   mupirocin ointment (BACTROBAN) 2 % APPLY TO AFFECTED AREA TWICE A DAY (Patient taking differently: Apply 1 application topically 2 (two) times daily. )   NARCAN 4 MG/0.1ML LIQD nasal spray kit Place 1 spray into the nose once.    NON FORMULARY Uses a C-PAP at bedtime   nystatin (MYCOSTATIN) 100000 UNIT/ML suspension TAKE 5 MLS (500,000 UNITS TOTAL) BY MOUTH 4 (FOUR) TIMES DAILY. SWISH AND SWALLOW.   Omega-3 Fatty Acids (FISH OIL) 1000 MG CAPS Take 1,000 mg by mouth daily.    omeprazole (PRILOSEC) 20 MG capsule Take 1 capsule (20 mg total) by mouth daily as needed.   potassium chloride SA (KLOR-CON M20) 20 MEQ tablet Take 1 tablet (20 mEq total) by mouth 2 (two) times a week. Take 1 tablet twice weekly on Monday and Thursday.   PROAIR HFA 108 (90 Base) MCG/ACT inhaler INHALE 2 PUFFS INTO THE LUNGS EVERY 6 (SIX) HOURS AS NEEDED FOR SHORTNESS OF BREATH. (Patient taking differently: Inhale 2 puffs into the lungs every 6 (six) hours as needed for shortness of breath. )   promethazine (PHENERGAN) 25 MG tablet Take 1 tablet (25 mg total) by mouth every 6 (six) hours as needed for nausea.   sertraline (ZOLOFT) 25 MG tablet TAKE 1 TABLET BY MOUTH EVERY DAY (Patient taking differently: Take 25 mg by mouth daily. )   TOVIAZ 8 MG TB24 tablet Take 8 mg by mouth daily.   varenicline (CHANTIX) 0.5 MG tablet Take 1 tablet (0.5 mg total) by mouth 2 (two) times daily.   Vitamin D, Ergocalciferol, (DRISDOL) 50000 units CAPS capsule Take 50,000 Units by mouth daily. For 30 days     Allergies:   Aspirin, Chantix [varenicline tartrate], Methadone hcl, Corticosteroids, and Levofloxacin   Social  History   Tobacco Use   Smoking status: Current Some Day Smoker    Packs/day: 0.25    Years: 40.00    Pack years: 10.00    Types: Cigarettes    Start date: 01/28/1977    Last attempt to quit: 09/07/2017    Years since quitting: 1.0   Smokeless tobacco: Never Used   Tobacco comment: Quit prior to hospitalization using low dose Chantix  Substance Use Topics   Alcohol use: No    Alcohol/week: 0.0 standard drinks   Drug use: Not Currently    Types: Marijuana, "Crack" cocaine    Comment: hx marijuana usemany years ago, Crack -none since 11/2015     Family Hx: The patient's family history includes Alcohol abuse in her father; Arthritis in her sister; Asthma in her sister and another family member; Breast cancer in her maternal aunt; COPD in an other family member; Cancer in her maternal aunt; Depression in her sister; Diabetes in an other family member; Hypertension in an other family member; Pneumonia in her father; Pulmonary embolism in her mother; Sickle cell anemia in her son; Stroke in her father.  ROS:   Please see the history of present illness.    Constitutional: Negative for chills, fever, night sweats, unintentional weight loss  HENT: Negative for ear pain and hearing loss.   Eyes: Negative for loss of vision and eye pain.  Respiratory: Negative for cough, sputum, wheezing. Positive for shortness of breath.   Cardiovascular: See HPI. Gastrointestinal: Negative for abdominal pain, melena, and hematochezia.  Genitourinary: Negative for dysuria and  hematuria.  Musculoskeletal: Negative for falls and myalgias.  Skin: Negative for itching and rash.  Neurological: Negative for loss of consciousness. Focal pain/weakness being evaluated. Endo/Heme/Allergies: Does not bruise/bleed easily.  All other systems reviewed and are negative.   Prior CV studies:   The following studies were reviewed today: Monitor, stress test, echo 2019  Labs/Other Tests and Data Reviewed:    EKG:   An ECG dated 09/24/18 was personally reviewed today and demonstrated:  SR, PAC, IVCD  Recent Labs: 02/23/2018: ALT 26 03/06/2018: TSH 0.469 09/24/2018: B Natriuretic Peptide 41.7; Hemoglobin 16.0; Platelets 282 09/25/2018: BUN 27; Creatinine, Ser 1.51; Potassium 3.3; Sodium 138   Recent Lipid Panel Lab Results  Component Value Date/Time   CHOL 146 09/24/2018 11:36 PM   CHOL 145 10/27/2017 09:44 AM   TRIG 165 (H) 09/24/2018 11:36 PM   HDL 37 (L) 09/24/2018 11:36 PM   HDL 55 10/27/2017 09:44 AM   CHOLHDL 3.9 09/24/2018 11:36 PM   LDLCALC 76 09/24/2018 11:36 PM   LDLCALC 70 10/27/2017 09:44 AM   LDLDIRECT 114 (H) 09/15/2012 03:59 PM    Wt Readings from Last 3 Encounters:  10/12/18 (!) 389 lb 9.6 oz (176.7 kg)  10/02/18 (!) 392 lb (177.8 kg)  09/29/18 (!) 383 lb 4 oz (173.8 kg)     Objective:    Vital Signs:  BP 134/85    Pulse 92    Ht '5\' 10"'  (1.778 m)    Wt (!) 389 lb 9.6 oz (176.7 kg)    LMP 04/10/2006    BMI 55.90 kg/m    VITAL SIGNS:  reviewed GEN:  no acute distress EYES:  sclerae anicteric, EOMI - Extraocular Movements Intact RESPIRATORY:  normal respiratory effort, symmetric expansion CARDIOVASCULAR:  no visible JVD SKIN:  no rash, lesions or ulcers. MUSCULOSKELETAL:  no obvious deformities. NEURO:  alert and oriented x 3, no obvious focal deficit PSYCH:  normal affect  ASSESSMENT & PLAN:    Chest pain: resolved. Exacerbated and relieved by movement, nonischemic ECG, unremarkable hsTn during recent hospitalization. Prior negative stress test 10/2017. Monitor.  Chronic diastolic heart failure:worse in the last few weeks, she reports 16 lb weight gain after myelogram. Unclear reason for this, but has noticed worsened breathing, back on O2. -change to metolazone 2.5 mg M/W/F for two weeks or until weight is stable around 380 lbs, then return to M/Thurs metolazone dosing. Take potassium when she takes metolazone. -continue furosemide as ordered -working on diet, weight  loss. Has done an excellent job with this. Very invested in diet. Spent time discussing this today, sending her handouts. Will see if we can get a care guide to discuss with her   -daily weights -she will call us with any issues. We will follow up closely.  Paroxysmal SVT: no recent symptoms, none in the hospital -continue diltiazem  Type II diabetes with stage 3 CKD: on insulin and jardiance -working on diet and weight loss, which we discussed today -ultimately planning for bariatric surgery  Hypertension: Has fluctuated but close to goal today -she will continue to monitor. Goal <130/80. Will call if becomes consistently elevated -continue dilitazem, diuretics, losartan  Hypercholesterolemia: continue atorvastatin, lipids rechecked in hospital 08/2018  COVID-19 Education: The signs and symptoms of COVID-19 were discussed with the patient and how to seek care for testing (follow up with PCP or arrange E-visit).  The importance of social distancing was discussed today.  Time:   Today, I have spent 27 minutes with the patient with  telehealth technology discussing the above problems.     Medication Adjustments/Labs and Tests Ordered: Current medicines are reviewed at length with the patient today.  Concerns regarding medicines are outlined above.    Patient Instructions  Medication Instructions:  -Take metolazone M/W/F for the next 1-2 weeks or until weight is stable around 380 lbs -Take potassium when you take metolazone   If you need a refill on your cardiac medications before your next appointment, please call your pharmacy.   Lab work: None  Testing/Procedures: None  Follow-Up: At [facility], you and your health needs are our priority.  As part of our continuing mission to provide you with exceptional heart care, we have created designated Provider Care Teams.  These Care Teams include your primary Cardiologist (physician) and Advanced Practice Providers (APPs -   Physician Assistants and Nurse Practitioners) who all work together to provide you with the care you need, when you need it. You will need a follow up appointment in 4 weeks.  Please call our office 2 months in advance to schedule this appointment.  You may see [name], MD or one of the following Advanced Practice Providers on your designated Care Team:   Rosaria Ferries, PA-C  Jory Sims, DNP, ANP   Low-Sodium Eating Plan Sodium, which is an element that makes up salt, helps you maintain a healthy balance of fluids in your body. Too much sodium can increase your blood pressure and cause fluid and waste to be held in your body. Your health care provider or dietitian may recommend following this plan if you have high blood pressure (hypertension), kidney disease, liver disease, or heart failure. Eating less sodium can help lower your blood pressure, reduce swelling, and protect your heart, liver, and kidneys. What are tips for following this plan? General guidelines  Most people on this plan should limit their sodium intake to 1,500-2,000 mg (milligrams) of sodium each day. Reading food labels   The Nutrition Facts label lists the amount of sodium in one serving of the food. If you eat more than one serving, you must multiply the listed amount of sodium by the number of servings.  Choose foods with less than 140 mg of sodium per serving.  Avoid foods with 300 mg of sodium or more per serving. Shopping  Look for lower-sodium products, often labeled as "low-sodium" or "no salt added."  Always check the sodium content even if foods are labeled as "unsalted" or "no salt added".  Buy fresh foods. ? Avoid canned foods and premade or frozen meals. ? Avoid canned, cured, or processed meats  Buy breads that have less than 80 mg of sodium per slice. Cooking  Eat more home-cooked food and less restaurant, buffet, and fast food.  Avoid adding salt when cooking. Use  salt-free seasonings or herbs instead of table salt or sea salt. Check with your health care provider or pharmacist before using salt substitutes.  Cook with plant-based oils, such as canola, sunflower, or olive oil. Meal planning  When eating at a restaurant, ask that your food be prepared with less salt or no salt, if possible.  Avoid foods that contain MSG (monosodium glutamate). MSG is sometimes added to [place] food, bouillon, and some canned foods. What foods are recommended? The items listed may not be a complete list. Talk with your dietitian about what dietary choices are best for you. Grains Low-sodium cereals, including oats, puffed wheat and rice, and shredded wheat. Low-sodium crackers. Unsalted rice. Unsalted pasta. Low-sodium  bread. Whole-grain breads and whole-grain pasta. Vegetables Fresh or frozen vegetables. "No salt added" canned vegetables. "No salt added" tomato sauce and paste. Low-sodium or reduced-sodium tomato and vegetable juice. Fruits Fresh, frozen, or canned fruit. Fruit juice. Meats and other protein foods Fresh or frozen (no salt added) meat, poultry, seafood, and fish. Low-sodium canned tuna and salmon. Unsalted nuts. Dried peas, beans, and lentils without added salt. Unsalted canned beans. Eggs. Unsalted nut butters. Dairy Milk. Soy milk. Cheese that is naturally low in sodium, such as ricotta cheese, fresh mozzarella, or Swiss cheese Low-sodium or reduced-sodium cheese. Cream cheese. Yogurt. Fats and oils Unsalted butter. Unsalted margarine with no trans fat. Vegetable oils such as canola or olive oils. Seasonings and other foods Fresh and dried herbs and spices. Salt-free seasonings. Low-sodium mustard and ketchup. Sodium-free salad dressing. Sodium-free light mayonnaise. Fresh or refrigerated horseradish. Lemon juice. Vinegar. Homemade, reduced-sodium, or low-sodium soups. Unsalted popcorn and pretzels. Low-salt or salt-free chips. What foods are not  recommended? The items listed may not be a complete list. Talk with your dietitian about what dietary choices are best for you. Grains Instant hot cereals. Bread stuffing, pancake, and biscuit mixes. Croutons. Seasoned rice or pasta mixes. Noodle soup cups. Boxed or frozen macaroni and cheese. Regular salted crackers. Self-rising flour. Vegetables Sauerkraut, pickled vegetables, and relishes. Olives. [place] fries. Onion rings. Regular canned vegetables (not low-sodium or reduced-sodium). Regular canned tomato sauce and paste (not low-sodium or reduced-sodium). Regular tomato and vegetable juice (not low-sodium or reduced-sodium). Frozen vegetables in sauces. Meats and other protein foods Meat or fish that is salted, canned, smoked, spiced, or pickled. Bacon, ham, sausage, hotdogs, corned beef, chipped beef, packaged lunch meats, salt pork, jerky, pickled herring, anchovies, regular canned tuna, sardines, salted nuts. Dairy Processed cheese and cheese spreads. Cheese curds. Blue cheese. Feta cheese. String cheese. Regular cottage cheese. Buttermilk. Canned milk. Fats and oils Salted butter. Regular margarine. Ghee. Bacon fat. Seasonings and other foods Onion salt, garlic salt, seasoned salt, table salt, and sea salt. Canned and packaged gravies. Worcestershire sauce. Tartar sauce. Barbecue sauce. Teriyaki sauce. Soy sauce, including reduced-sodium. Steak sauce. Fish sauce. Oyster sauce. Cocktail sauce. Horseradish that you find on the shelf. Regular ketchup and mustard. Meat flavorings and tenderizers. Bouillon cubes. Hot sauce and Tabasco sauce. Premade or packaged marinades. Premade or packaged taco seasonings. Relishes. Regular salad dressings. Salsa. Potato and tortilla chips. Corn chips and puffs. Salted popcorn and pretzels. Canned or dried soups. Pizza. Frozen entrees and pot pies. Summary  Eating less sodium can help lower your blood pressure, reduce swelling, and protect your heart, liver,  and kidneys.  Most people on this plan should limit their sodium intake to 1,500-2,000 mg (milligrams) of sodium each day.  Canned, boxed, and frozen foods are high in sodium. Restaurant foods, fast foods, and pizza are also very high in sodium. You also get sodium by adding salt to food.  Try to cook at home, eat more fresh fruits and vegetables, and eat less fast food, canned, processed, or prepared foods. This information is not intended to replace advice given to you by your health care provider. Make sure you discuss any questions you have with your health care provider. Document Released: 07/06/2001 Document Revised: 12/27/2016 Document Reviewed: 01/08/2016 Elsevier Patient Education  2020 [place] Many factors influence your heart (coronary) health, including eating and exercise habits. Coronary risk increases with abnormal blood fat (lipid) levels. Heart-healthy meal planning includes limiting unhealthy fats, increasing healthy  fats, and making other diet and lifestyle changes. What is my plan? Your health care provider may recommend that you:  Limit your fat intake to _________% or less of your total calories each day.  Limit your saturated fat intake to _________% or less of your total calories each day.  Limit the amount of cholesterol in your diet to less than _________ mg per day. What are tips for following this plan? Cooking Cook foods using methods other than frying. Baking, boiling, grilling, and broiling are all good options. Other ways to reduce fat include:  Removing the skin from poultry.  Removing all visible fats from meats.  Steaming vegetables in water or broth. Meal planning   At meals, imagine dividing your plate into fourths: ? Fill one-half of your plate with vegetables and green salads. ? Fill one-fourth of your plate with whole grains. ? Fill one-fourth of your plate with lean protein foods.  Eat 4-5 servings of  vegetables per day. One serving equals 1 cup raw or cooked vegetable, or 2 cups raw leafy greens.  Eat 4-5 servings of fruit per day. One serving equals 1 medium whole fruit,  cup dried fruit,  cup fresh, frozen, or canned fruit, or  cup 100% fruit juice.  Eat more foods that contain soluble fiber. Examples include apples, broccoli, carrots, beans, peas, and barley. Aim to get 25-30 g of fiber per day.  Increase your consumption of legumes, nuts, and seeds to 4-5 servings per week. One serving of dried beans or legumes equals  cup cooked, 1 serving of nuts is  cup, and 1 serving of seeds equals 1 tablespoon. Fats  Choose healthy fats more often. Choose monounsaturated and polyunsaturated fats, such as olive and canola oils, flaxseeds, walnuts, almonds, and seeds.  Eat more omega-3 fats. Choose salmon, mackerel, sardines, tuna, flaxseed oil, and ground flaxseeds. Aim to eat fish at least 2 times each week.  Check food labels carefully to identify foods with trans fats or high amounts of saturated fat.  Limit saturated fats. These are found in animal products, such as meats, butter, and cream. Plant sources of saturated fats include palm oil, palm kernel oil, and coconut oil.  Avoid foods with partially hydrogenated oils in them. These contain trans fats. Examples are stick margarine, some tub margarines, cookies, crackers, and other baked goods.  Avoid fried foods. General information  Eat more home-cooked food and less restaurant, buffet, and fast food.  Limit or avoid alcohol.  Limit foods that are high in starch and sugar.  Lose weight if you are overweight. Losing just 5-10% of your body weight can help your overall health and prevent diseases such as diabetes and heart disease.  Monitor your salt (sodium) intake, especially if you have high blood pressure. Talk with your health care provider about your sodium intake.  Try to incorporate more vegetarian meals weekly. What  foods can I eat? Fruits All fresh, canned (in natural juice), or frozen fruits. Vegetables Fresh or frozen vegetables (raw, steamed, roasted, or grilled). Green salads. Grains Most grains. Choose whole wheat and whole grains most of the time. Rice and pasta, including brown rice and pastas made with whole wheat. Meats and other proteins Lean, well-trimmed beef, veal, pork, and lamb. Chicken and [place] without skin. All fish and shellfish. Wild duck, rabbit, pheasant, and venison. Egg whites or low-cholesterol egg substitutes. Dried beans, peas, lentils, and tofu. Seeds and most nuts. Dairy Low-fat or nonfat cheeses, including ricotta and mozzarella. Skim or  1% milk (liquid, powdered, or evaporated). Buttermilk made with low-fat milk. Nonfat or low-fat yogurt. Fats and oils Non-hydrogenated (trans-free) margarines. Vegetable oils, including soybean, sesame, sunflower, olive, peanut, safflower, corn, canola, and cottonseed. Salad dressings or mayonnaise made with a vegetable oil. Beverages Water (mineral or sparkling). Coffee and tea. Diet carbonated beverages. Sweets and desserts Sherbet, gelatin, and fruit ice. Small amounts of dark chocolate. Limit all sweets and desserts. Seasonings and condiments All seasonings and condiments. The items listed above may not be a complete list of foods and beverages you can eat. Contact a dietitian for more options. What foods are not recommended? Fruits Canned fruit in heavy syrup. Fruit in cream or butter sauce. Fried fruit. Limit coconut. Vegetables Vegetables cooked in cheese, cream, or butter sauce. Fried vegetables. Grains Breads made with saturated or trans fats, oils, or whole milk. Croissants. Sweet rolls. Donuts. High-fat crackers, such as cheese crackers. Meats and other proteins Fatty meats, such as hot dogs, ribs, sausage, bacon, rib-eye roast or steak. High-fat deli meats, such as salami and bologna. Caviar. Domestic duck and goose. Organ  meats, such as liver. Dairy Cream, sour cream, cream cheese, and creamed cottage cheese. Whole milk cheeses. Whole or 2% milk (liquid, evaporated, or condensed). Whole buttermilk. Cream sauce or high-fat cheese sauce. Whole-milk yogurt. Fats and oils Meat fat, or shortening. Cocoa butter, hydrogenated oils, palm oil, coconut oil, palm kernel oil. Solid fats and shortenings, including bacon fat, salt pork, lard, and butter. Nondairy cream substitutes. Salad dressings with cheese or sour cream. Beverages Regular sodas and any drinks with added sugar. Sweets and desserts Frosting. Pudding. Cookies. Cakes. Pies. Milk chocolate or white chocolate. Buttered syrups. Full-fat ice cream or ice cream drinks. The items listed above may not be a complete list of foods and beverages to avoid. Contact a dietitian for more information. Summary  Heart-healthy meal planning includes limiting unhealthy fats, increasing healthy fats, and making other diet and lifestyle changes.  Lose weight if you are overweight. Losing just 5-10% of your body weight can help your overall health and prevent diseases such as diabetes and heart disease.  Focus on eating a balance of foods, including fruits and vegetables, low-fat or nonfat dairy, lean protein, nuts and legumes, whole grains, and heart-healthy oils and fats. This information is not intended to replace advice given to you by your health care provider. Make sure you discuss any questions you have with your health care provider. Document Released: 10/24/2007 Document Revised: 02/21/2017 Document Reviewed: 02/21/2017 Elsevier Patient Education  2020 [employer].    Follow Up:  4 weeks  Signed, [name], MD  10/12/2018 8:50 AM    [place]

## 2018-10-13 ENCOUNTER — Encounter: Payer: Self-pay | Admitting: Family Medicine

## 2018-10-13 ENCOUNTER — Other Ambulatory Visit: Payer: Self-pay | Admitting: *Deleted

## 2018-10-13 ENCOUNTER — Other Ambulatory Visit: Payer: Self-pay

## 2018-10-13 ENCOUNTER — Ambulatory Visit (INDEPENDENT_AMBULATORY_CARE_PROVIDER_SITE_OTHER): Payer: Medicare Other | Admitting: Psychology

## 2018-10-13 DIAGNOSIS — F329 Major depressive disorder, single episode, unspecified: Secondary | ICD-10-CM

## 2018-10-13 DIAGNOSIS — R4589 Other symptoms and signs involving emotional state: Secondary | ICD-10-CM

## 2018-10-13 DIAGNOSIS — F320 Major depressive disorder, single episode, mild: Secondary | ICD-10-CM

## 2018-10-13 MED ORDER — SERTRALINE HCL 50 MG PO TABS: 50.0000 mg | ORAL_TABLET | Freq: Every day | ORAL | 0 refills | Status: DC

## 2018-10-13 NOTE — Progress Notes (Signed)
Integrated Behavioral Health Visit via Telemedicine (Telephone)  0[id number] [name] [id number]   Session Start time: 9AM  Session End time: 930AM Total time: 30 minutes  Referring Provider: [place] Type of Visit: Telephonic Patient location: Home [name] Provider location: [facility] All persons participating in visit: pt and provider   Confirmed patient's address: Yes  Confirmed patient's phone number: No  Any changes to demographics: Yes   Confirmed patient's insurance: Yes    Discussed confidentiality: Yes    The following statements were read to the patient and/or legal guardian that are established with the [facility] Provider.  "The purpose of this phone visit is to provide behavioral health care while limiting exposure to the coronavirus (COVID19).  There is a possibility of technology failure and discussed alternative modes of communication if that failure occurs."  "By engaging in this telephone visit, you consent to the provision of healthcare.  Additionally, you authorize for your insurance to be billed for the services provided during this telephone visit."   Patient and/or legal guardian consented to telephone visit: Yes   PRESENTING CONCERNS: Patient and/or family reports the following symptoms/concerns: depression syx: tiredness, depressed mood, overeating, trouble concentrating. Pt reports that her chronic illness and recent weight gain contribute to her mood. Pt reported her niece was recently killed and the suspect is still not in custody and has reported to them more intent to kill.  Pt reports she feels safe and police are currently involved.  Duration of problem: 3 months; Severity of problem:mild  STRENGTHS (Protective Factors/Coping Skills): Pt strengths include asking for help and support system. Pt is currently on Zoloft 25mg   GOALS ADDRESSED: Patient will: 1.  Reduce symptoms of: depression  2.  Increase knowledge and/or ability of: coping  skills  3.  Demonstrate ability to: Increase healthy adjustment to current life circumstances  INTERVENTIONS: Interventions utilized:  Brief CBT Standardized Assessments completed: PHQ 9 -score 16, denied SI/HI  ASSESSMENT: Patient currently experiencing depression related to COVID pandemic, recent family losses and chronic pain.    Patient may benefit from CBT with challenging negative thoughts and self-care.   PLAN: 1. Follow up with behavioral health clinician on : Challenging negative thoughts. Pt will f/u with PCP about increasing medication for depression syx 4. Behavioral recommendations: Pt will engage in challenging negative thoughts around depression and find time for self-care.  Referral(s): [facility]  Jerilee Hoh, PhD, LMFT-A

## 2018-10-13 NOTE — Telephone Encounter (Signed)
Spoke with patient on the phone about her conversation with Dr. Hartford Poli.  Patient reports that she was on Zoloft in the past and that it helped but she was only higher dose.  Patient reports she was restarted on Zoloft but started the lowest dose and feels like she needs higher dose.  Will increase to 50 mg daily and will see patient in the office on October 6.  Patient also requesting referral for a wheelchair.  I will looking to this and see why she has not been contacted about an appointment for evaluation for a wheelchair.

## 2018-10-13 NOTE — Patient Outreach (Signed)
Follow up with Ms. Shader per her request regarding other portable oxygen devices.  [facility] tried her on the Simply Go Mini and the Ox Life Freedom. She failed their tests as her O2 level dropped below target range while walking and having 5L flow.  Ms. Leclaire uses O2 at home but cannot carry the current heavy and cumbersome device she has now, so she goes without O2 when she is out of the house.  Researched the [employer] device which they do have the capability to provide up to 6L flow, however, Medicare does not reimburse for the product and she cannot afford the out of pocket expense which is $2445.00 (with one battery only.  She reports to me today she has spoken with her therapist as she is more depressed than usual. Therapist gave her homework for this week to write down all the things that make her happy. The therapist also sent a note to Dr. Caron Presume to consider increasing her sertraline dose from 25 mg to 50 mg.  Answered all pt questions. Supported her and praised her for being proactive in taking care of herself.  WIll call her back Oct 13th.  [name]. Myrtie Neither, MSN, [facility] Gerontological Nurse Practitioner [facility] Care Management [phone]

## 2018-10-15 ENCOUNTER — Other Ambulatory Visit: Payer: Self-pay

## 2018-10-15 ENCOUNTER — Ambulatory Visit: Payer: Medicare Other | Attending: Specialist

## 2018-10-15 DIAGNOSIS — M6281 Muscle weakness (generalized): Secondary | ICD-10-CM

## 2018-10-15 DIAGNOSIS — R293 Abnormal posture: Secondary | ICD-10-CM | POA: Diagnosis not present

## 2018-10-15 DIAGNOSIS — M545 Low back pain, unspecified: Secondary | ICD-10-CM

## 2018-10-15 DIAGNOSIS — M542 Cervicalgia: Secondary | ICD-10-CM

## 2018-10-15 DIAGNOSIS — G8929 Other chronic pain: Secondary | ICD-10-CM | POA: Diagnosis not present

## 2018-10-15 NOTE — Patient Instructions (Signed)
General thumb ROM.   Shoulder retraction and rolls ,  X 2-10 3-4x/day, PPT and LTR x 10 2x/day

## 2018-10-15 NOTE — Therapy (Signed)
[place], [place], [address] Phone: [phone]   Fax:  [phone]  Physical Therapy Evaluation  Patient Details  Name: [name] MRN: [id number] Date of Birth: 1965/07/22 Referring Provider (PT): [name], MD   Encounter Date: 10/15/2018  PT End of Session - 10/15/18 0737    Visit Number  1    Number of Visits  16    Date for PT Re-Evaluation  12/11/18    Authorization Type  UHC MCR    PT Start Time  0738    PT Stop Time  0830    PT Time Calculation (min)  52 min    Activity Tolerance  Patient tolerated treatment well;Patient limited by pain    Behavior During Therapy  [facility] for tasks assessed/performed       Past Medical History:  Diagnosis Date  . Anxiety   . Arthritis 04-10-11   hips, shoulders, back  . Asthma   . Bipolar disorder ([place])   . Cancer ([place]) 1993   cervical, no treatment done, went away per pt  . Cervical dysplasia or atypia 04-10-11   '93- once dx.-got pregnant-no intervention, then postpartum, no dysplasia found  . CHF (congestive heart failure) ([place])    no cardiologist 2014 dx, none now  . Chronic kidney disease    stage 3 kidney disease  . 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 Pathology showed Condyloma, gluteal CONDYLOMA ACUMINATUM.   [name] Kitchen COPD (chronic obstructive pulmonary disease) ([place])   . Diabetes mellitus without complication ([place])   . Dyspnea    with actity, sitting  . Fall 12/16/2016  . Fibrocystic breast disease   . GERD (gastroesophageal reflux disease)   . Hepatitis    hep B-count is low at present,doesn't register  . Hypercalcemia   . Hyperlipidemia   . Hypertension   . Lung nodule 11/2017   right and left lung  . Migraine headache    none recent  . Morbid obesity ([place]) 03/27/2006  . Neuropathy   . Pneumonia    walking pneu 15 yrs. ago  . Skin lesion 03/15/2011   In gluteal crease now s/p removal by Dr. [name] of  General Surgery on 3/12. Path shows condyloma.     . Sleep apnea 04-10-11   uses cpap, pt does not know settings  . Sleep apnea   . Trigger finger    left third    Past Surgical History:  Procedure Laterality Date  . ANAL FISTULECTOMY  04/17/2011   Procedure: FISTULECTOMY ANAL;  Surgeon: Imogene Burn. [name], MD;  Location: [facility] ORS;  Service: General;  Laterality: N/A;  Excision of Condyloma Gluteal Cleft   . BACK SURGERY  04-10-11   x5-Lumbar fusion-retained hardware.(Dr. Louanne Skye)  . BREAST CYST EXCISION     bilateral breast, 3 cysts removed from each breast  . BREAST EXCISIONAL BIOPSY Right    x 3  . BREAST EXCISIONAL BIOPSY Left    x 3  . CARPAL TUNNEL RELEASE Bilateral   . Cervical biospy    . COLONOSCOPY WITH PROPOFOL N/A 07/06/2015   Procedure: COLONOSCOPY WITH PROPOFOL;  Surgeon: [name], MD;  Location: [facility] ENDOSCOPY;  Service: Endoscopy;  Laterality: N/A;  . DOPPLER ECHOCARDIOGRAPHY  04/06/2012   AT [place] 55-60%  . FRACTURE SURGERY    . I&D EXTREMITY Left 12/18/2016   Procedure: IRRIGATION AND DEBRIDEMENT LEFT FOOT, CLOSURE;  Surgeon: [name], MD;  Location: [facility]  OR;  Service: Orthopedics;  Laterality: Left;  [name] Kitchen MULTIPLE TOOTH EXTRACTIONS    . NECK SURGERY  04-10-11   x3- cervical fusion with plating and screws-Dr. [name]  . TEE WITHOUT CARDIOVERSION N/A 04/06/2012   Procedure: TRANSESOPHAGEAL ECHOCARDIOGRAM (TEE);  Surgeon: [name], MD;  [facility] ENDOSCOPY;  Service: Cardiovascular;  Laterality: N/A;  . TRIGGER FINGER RELEASE Right    middle finger  . TRIGGER FINGER RELEASE Left 06/28/2016   Procedure: RELEASE TRIGGER FINGER LEFT 3RD FINGER;  Surgeon: [name], MD;  Location: [facility];  Service: Orthopedics;  Laterality: Left;  . TRIGGER FINGER RELEASE Right 06/12/2016   Procedure: RIGHT INDEX FINGER TRIGGER RELEASE;  Surgeon: [name], MD;  Location: [facility];  Service: Orthopedics;  Laterality: Right;  . TRIGGER FINGER RELEASE Right 01/19/2018    Procedure: RIGHT RING FINGER TRIGGER FINGER RELEASE;  Surgeon: [name], MD;  Location: [facility];  Service: Orthopedics;  Laterality: Right;    There were no vitals filed for this visit.   Subjective Assessment - 10/15/18 0751    Subjective  She reports  she needs surgery for neck. She wanted to do PT before surgery. Shoulders are bad with pain to fingers.    She also reports need for surery to her back.  PT in past but she does not remember  any exercises and wa not able to indicate anything that made her better.    Pertinent History  neck surgey x 3.    5 back surgeries.  SOB  last time withou pain was 2017   Also bilateral knee and hip pain    Limitations  Standing;Walking;Lifting;House hold activities    How long can you sit comfortably?  30 min    How long can you walk comfortably?  household distances    Diagnostic tests  Xray    Patient Stated Goals  To take the pain away    Pain Location  Back    Pain Orientation  Right;Left;Lower;Upper    Pain Descriptors / Indicators  Aching;Burning;Sharp    Pain Type  Chronic pain    Pain Radiating Towards  t buttock    Pain Frequency  Constant    Multiple Pain Sites  Yes    Pain Location  Neck    Pain Orientation  Right;Left    Pain Descriptors / Indicators  Aching;Sharp    Pain Type  Chronic pain    Pain Radiating Towards  to shoulders and hands    Pain Onset  More than a month ago    Pain Frequency  Constant    Aggravating Factors   turnig neck, looking up    Pain Relieving Factors  nothing         [facility] PT Assessment - 10/15/18 0001      Assessment   Medical Diagnosis  neck and back pain    Referring Provider (PT)  [name], MD    Onset Date/Surgical Date  --   2017   Next MD Visit  next month    Prior Therapy  For ankle 2019      Precautions   Precaution Comments  5# lifting per MD      Restrictions   Weight Bearing Restrictions  No      Balance Screen   Has the patient fallen in the past 6 months  No       Prior Function   Level of Independence  Needs assistance with homemaking;Needs assistance with ADLs  Cognition   Overall Cognitive Status  Within Functional Limits for tasks assessed      Posture/Postural Control   Posture Comments  forward head , decr lordosis, mld shift LT       ROM / Strength   AROM / PROM / Strength  AROM;Strength;PROM      AROM   AROM Assessment Site  Cervical;Lumbar    Cervical Flexion  20    Cervical Extension  25    Cervical - Right Side Bend  18    Cervical - Left Side Bend  12    Cervical - Right Rotation  30    Cervical - Left Rotation  25    Lumbar Flexion  30    Lumbar Extension  10    Lumbar - Right Side Bend  5    Lumbar - Left Side Bend  5    Lumbar - Right Rotation  decr 60%     Lumbar - Left Rotation  decr 60%       PROM   Overall PROM Comments  Hips WFL bilaterally       Strength   Overall Strength Comments  GRossly WNL in LE   Fair adominals      Flexibility   Soft Tissue Assessment /Muscle Length  yes    Hamstrings  70 degrees bilaterally      Palpation   Palpation comment  tender bilatea paraspnals from neck to lower back.       Ambulation/Gait   Gait Comments  no device                  Objective measurements completed on examination: See above findings.                PT Short Term Goals - 10/15/18 0744      PT SHORT TERM GOAL #1   Title  Pt will be I with HEP for strength and ROM  neck /back/finger    Time  3    Period  Weeks    Status  New      PT SHORT TERM GOAL #2   Title  She will report neck pain decreased 25% or more    Time  4    Period  Weeks    Status  New      PT SHORT TERM GOAL #3   Title  she will report back pain decreased   25% or more    Time  4    Period  Weeks    Status  New      PT SHORT TERM GOAL #4   Title  Her finger will improve  extension by  degrees    Time  4    Period  Weeks    Status  New        PT Long Term Goals - 10/15/18 0746      PT LONG  TERM GOAL #1   Title  Pt will be I with more advanced HEP for LEs , back and neck    Time  8    Period  Weeks    Status  New      PT LONG TERM GOAL #2   Title  She will report able to   due to decreaed back pain    Time  8    Period  Weeks    Status  New      PT LONG TERM GOAL #3   Title  She will be able to    due to decreased    pain    Time  8    Period  Weeks    Status  New      PT LONG TERM GOAL #4   Title  She will report pain decreased 50% or more  in back and neck    Time  8    Period  Weeks    Status  New      PT LONG TERM GOAL #5   Title  FOTO score will improve to  % limited    Time  8    Period  Weeks    Status  New             Plan - 10/15/18 0738    Clinical Impression Statement  Ms Keisler presents with chronic back and neck pain and a triger finger that limit her functional mobility.  Due to chronicity and other  orthopedic issues progress may be limted. issues    Personal Factors and Comorbidities  Comorbidity 1;Comorbidity 2;Time since onset of injury/illness/exacerbation;Past/Current Experience    Comorbidities  obesity ACDF cervical multi level,    Examination-Activity Limitations  Stairs;Squat;Locomotion Level;Bend;Sit;Sleep    Examination-Participation Restrictions  Community Activity;Laundry;Meal Prep;Cleaning    Stability/Clinical Decision Making  Evolving/Moderate complexity    Clinical Decision Making  Moderate    Rehab Potential  Fair    PT Frequency  2x / week    PT Duration  8 weeks    PT Treatment/Interventions  Dry needling;Passive range of motion;Manual techniques;Therapeutic exercise;Patient/family education;Electrical Stimulation;Iontophoresis 4mg /ml Dexamethasone;Moist Heat;Traction    PT Next Visit Plan  HEP , modalities and manual for ROM and pain.    Consulted and Agree with Plan of Care  Patient       Patient will benefit from skilled therapeutic intervention in order to improve the following deficits and impairments:   Difficulty walking, Obesity, Pain, Postural dysfunction, Decreased strength, Decreased activity tolerance, Increased muscle spasms  Visit Diagnosis: Muscle weakness (generalized)  Cervicalgia  Chronic bilateral low back pain, unspecified whether sciatica present  Abnormal posture     Problem List Patient Active Problem List   Diagnosis Date Noted  . Chest pain 09/24/2018  . Tobacco abuse 08/21/2018  . Right groin pain 07/20/2018  . Depressed mood 07/13/2018  . Mouth pain 06/02/2018  . Paroxysmal SVT (supraventricular tachycardia) ([place]) 03/20/2018  . Pure hypercholesterolemia 03/20/2018  . Muscle cramping 02/25/2018  . Chest pressure 02/20/2018  . Nodule of upper lobe of right lung 02/13/2018  . Falls, subsequent encounter 01/30/2018  . Open wound of left foot 11/04/2017  . Essential hypertension 10/27/2017  . Solitary pulmonary nodule 10/07/2017  . Acute on chronic right-sided congestive heart failure ([place])   . Restrictive lung disease secondary to obesity 10/01/2017  . Polycythemia, secondary 10/01/2017  . Chronic viral hepatitis B without delta-agent ([place]) 07/08/2017  . COPD (chronic obstructive pulmonary disease) with chronic bronchitis ([place]) 06/27/2017  . Chronic kidney disease (CKD), stage IV (severe) ([place]) 01/14/2017  . Type 2 diabetes mellitus with stage 3 chronic kidney disease, with long-term current use of insulin ([place]) 12/12/2016  . Urge incontinence of urine 12/05/2016  . Trigger index finger of left hand 07/15/2016  . Back pain 04/07/2014  . Morbid obesity ([place]) 10/02/2012  . Chronic diastolic CHF (congestive heart failure) ([place]) 04/07/2012  . GERD 01/26/2010  . Hypertension 08/04/2008  . Obstructive sleep apnea treated with BiPAP 02/03/2008  . FIBROCYSTIC BREAST DISEASE 10/28/2006  .  Hyperlipidemia 03/27/2006  . Obesity hypoventilation syndrome ([place]) 03/27/2006  . Former tobacco use 03/27/2006  . Depression with anxiety 03/27/2006    Darrel Hoover PT 10/15/2018, 8:20 AM  [facility] [address] [place], [place], [address] Phone: [phone]   Fax:  [phone]  Name: ILHAM ROUGHTON MRN: [id number] Date of Birth: 09-17-1965

## 2018-10-19 ENCOUNTER — Other Ambulatory Visit: Payer: Medicare Other

## 2018-10-23 ENCOUNTER — Ambulatory Visit (INDEPENDENT_AMBULATORY_CARE_PROVIDER_SITE_OTHER): Payer: Medicare Other | Admitting: Physical Medicine and Rehabilitation

## 2018-10-23 ENCOUNTER — Encounter: Payer: Self-pay | Admitting: Physical Medicine and Rehabilitation

## 2018-10-23 ENCOUNTER — Encounter

## 2018-10-23 DIAGNOSIS — R202 Paresthesia of skin: Secondary | ICD-10-CM

## 2018-10-23 NOTE — Progress Notes (Signed)
.   .  Numeric Pain Rating Scale and Functional Assessment Average Pain 9   In the last MONTH (on 0-10 scale) has pain interfered with the following?  1. General activity like being  able to carry out your everyday physical activities such as walking, climbing stairs, carrying groceries, or moving a chair?  Rating(9)

## 2018-10-26 ENCOUNTER — Encounter: Payer: Self-pay | Admitting: Adult Health

## 2018-10-26 ENCOUNTER — Ambulatory Visit (INDEPENDENT_AMBULATORY_CARE_PROVIDER_SITE_OTHER): Payer: Medicare Other | Admitting: Adult Health

## 2018-10-26 ENCOUNTER — Other Ambulatory Visit: Payer: Self-pay

## 2018-10-26 VITALS — BP 138/72 | HR 66 | Temp 96.8°F | Ht 70.0 in | Wt 380.0 lb

## 2018-10-26 DIAGNOSIS — G4733 Obstructive sleep apnea (adult) (pediatric): Secondary | ICD-10-CM

## 2018-10-26 NOTE — Progress Notes (Signed)
[name] - 53 y.o. female MRN [id number]  Date of birth: [birth year]  Office Visit Note: Visit Date: 10/23/2018 PCP: [name], MD Referred by: [name], MD  Subjective: Chief Complaint  Patient presents with  . Right Hand - Pain, Numbness, Tingling  . Left Hand - Pain, Numbness, Tingling  . Neck - Pain  . Right Shoulder - Pain  . Left Shoulder - Pain  . Right Arm - Pain  . Left Arm - Pain   HPI: KATIRA Osborne is a 53 y.o. female who comes in today At the request of Dr. [name] for electrodiagnostic study of both upper limbs.  Patient is right-hand dominant and she had prior electrodiagnostic study on the right upper limb in October of last year.  Those results were reviewed below.  She is complaining of neck pain referring into both shoulders both arms with pain numbness and tingling into the right side particularly the fourth and fifth digit in the left hand particularly into the index finger.  She reports these symptoms began worsening in February of this year.  She does not endorse specific trauma.  Nothing seems to help with her symptoms at all.  She has trouble bending her fingers and reports she has trigger fingers.  She has had a history of extensive cervical spine surgery.  She has had recent MRI of the cervical spine and this is reviewed below.  No nerve compression noted on MRI.  She also is an insulin-dependent diabetic with morbid obesity.  ROS Otherwise per HPI.  Assessment & Plan: Visit Diagnoses:  1. Paresthesia of skin     Plan: Impression: The above electrodiagnostic study is ABNORMAL and reveals evidence of moderate subacute on chronic C8 and T1 radiculopathy on the left >right.  *Nerve conductions do not show any evidence of a demyelinating polyneuropathy but I cannot totally rule out the needle EMG findings being a axonal neuropathy which could still be related to diabetes or some other nerve insult.  There is some evidence of very mild  right median nerve entrapment at the wrist.    There is no significant electrodiagnostic evidence of any other focal nerve entrapment, brachial plexopathy or generalized peripheral neuropathy.   Recommendations: 1.  Follow-up with referring physician. 2.  Continue current management of symptoms.   Meds & Orders: No orders of the defined types were placed in this encounter.   Orders Placed This Encounter  Procedures  . NCV with EMG (electromyography)    Follow-up: Return for [name], M.D..   Procedures: No procedures performed  EMG & NCV Findings: Evaluation of the left median (across palm) sensory nerve showed no response (Palm).  The right median (across palm) sensory nerve showed no response (Palm) and prolonged distal peak latency (3.8 ms).  All remaining nerves (as indicated in the following tables) were within normal limits.  All left vs. right side differences were within normal limits.    Needle evaluation of the right abductor pollicis brevis muscle showed increased insertional activity.  The right first dorsal interosseous and the left abductor pollicis brevis muscles showed increased insertional activity and diminished recruitment.  The left first dorsal interosseous muscle showed increased insertional activity, widespread spontaneous activity, increased motor unit amplitude, and diminished recruitment.  All remaining muscles (as indicated in the following table) showed no evidence of electrical instability.    Impression: The above electrodiagnostic study is ABNORMAL and reveals evidence of moderate subacute on chronic C8 and T1 radiculopathy on  the left >right.  *Nerve conductions do not show any evidence of a demyelinating polyneuropathy but I cannot totally rule out the needle EMG findings being a axonal neuropathy which could still be related to diabetes or some other nerve insult.  There is some evidence of very mild right median nerve entrapment at the wrist.     There is no significant electrodiagnostic evidence of any other focal nerve entrapment, brachial plexopathy or generalized peripheral neuropathy.   Recommendations: 1.  Follow-up with referring physician. 2.  Continue current management of symptoms.  ___________________________ Laurence Spates FAAPMR Board Certified, American Board of Physical Medicine and Rehabilitation    Nerve Conduction Studies Anti Sensory Summary Table   Stim Site NR Peak (ms) Norm Peak (ms) P-T Amp (V) Norm P-T Amp Site1 Site2 Delta-P (ms) Dist (cm) Vel (m/s) Norm Vel (m/s)  Left Median Acr Palm Anti Sensory (2nd Digit)  32.6C  Wrist    3.6 <3.6 16.9 >10 Wrist Palm  0.0    Palm *NR  <2.0          Right Median Acr Palm Anti Sensory (2nd Digit)  32.4C  Wrist    *3.8 <3.6 19.0 >10 Wrist Palm  0.0    Palm *NR  <2.0          Left Radial Anti Sensory (Base 1st Digit)  32C  Wrist    2.0 <3.1 32.5  Wrist Base 1st Digit 2.0 0.0    Right Radial Anti Sensory (Base 1st Digit)  32.8C  Wrist    2.1 <3.1 32.9  Wrist Base 1st Digit 2.1 0.0    Left Ulnar Anti Sensory (5th Digit)  32.5C  Wrist    3.3 <3.7 19.7 >15.0 Wrist 5th Digit 3.3 14.0 42 >38  Right Ulnar Anti Sensory (5th Digit)  32.8C  Wrist    3.2 <3.7 28.0 >15.0 Wrist 5th Digit 3.2 14.0 44 >38   Motor Summary Table   Stim Site NR Onset (ms) Norm Onset (ms) O-P Amp (mV) Norm O-P Amp Site1 Site2 Delta-0 (ms) Dist (cm) Vel (m/s) Norm Vel (m/s)  Left Median Motor (Abd Poll Brev)  31.7C  Wrist    3.6 <4.2 10.5 >5 Elbow Wrist 4.5 22.5 50 >50  Elbow    8.1  5.6         Right Median Motor (Abd Poll Brev)  33C  Wrist    3.8 <4.2 7.7 >5 Elbow Wrist 4.3 21.5 50 >50  Elbow    8.1  6.9         Left Ulnar Motor (Abd Dig Min)  31.4C  Wrist    3.1 <4.2 8.5 >3 B Elbow Wrist 3.9 21.0 54 >53  B Elbow    7.0  8.3  A Elbow B Elbow 1.2 10.0 83 >53  A Elbow    8.2  8.4         Right Ulnar Motor (Abd Dig Min)  32.9C  Wrist    2.9 <4.2 7.3 >3 B Elbow Wrist 3.8 20.5 54 >53   B Elbow    6.7  7.0  A Elbow B Elbow 1.2 10.5 87 >53  A Elbow    7.9  6.9          EMG   Side Muscle Nerve Root Ins Act Fibs Psw Amp Dur Poly Recrt Int Fraser Din Comment  Right Abd Poll Brev Median C8-T1 *Incr Nml Nml Nml Nml 0 Nml Nml   Right 1stDorInt Ulnar C8-T1 *Incr Nml Nml  Nml Nml 0 *Reduced Nml   Right PronatorTeres Median C6-7 Nml Nml Nml Nml Nml 0 Nml Nml   Left Abd Poll Brev Median C8-T1 *Incr Nml Nml Nml Nml 0 *Reduced Nml   Left 1stDorInt Ulnar C8-T1 *Incr *4+ *4+ *Incr Nml 0 *Reduced Nml   Left PronatorTeres Median C6-7 Nml Nml Nml Nml Nml 0 Nml Nml   Left Biceps Musculocut C5-6 Nml Nml Nml Nml Nml 0 Nml Nml   Left Deltoid Axillary C5-6 Nml Nml Nml Nml Nml 0 Nml Nml     Nerve Conduction Studies Anti Sensory Left/Right Comparison   Stim Site L Lat (ms) R Lat (ms) L-R Lat (ms) L Amp (V) R Amp (V) L-R Amp (%) Site1 Site2 L Vel (m/s) R Vel (m/s) L-R Vel (m/s)  Median Acr Palm Anti Sensory (2nd Digit)  32.6C  Wrist 3.6 *3.8 0.2 16.9 19.0 11.1 Wrist Palm     Palm             Radial Anti Sensory (Base 1st Digit)  32C  Wrist 2.0 2.1 0.1 32.5 32.9 1.2 Wrist Base 1st Digit     Ulnar Anti Sensory (5th Digit)  32.5C  Wrist 3.3 3.2 0.1 19.7 28.0 29.6 Wrist 5th Digit 42 44 2   Motor Left/Right Comparison   Stim Site L Lat (ms) R Lat (ms) L-R Lat (ms) L Amp (mV) R Amp (mV) L-R Amp (%) Site1 Site2 L Vel (m/s) R Vel (m/s) L-R Vel (m/s)  Median Motor (Abd Poll Brev)  31.7C  Wrist 3.6 3.8 0.2 10.5 7.7 26.7 Elbow Wrist 50 50 0  Elbow 8.1 8.1 0.0 5.6 6.9 18.8       Ulnar Motor (Abd Dig Min)  31.4C  Wrist 3.1 2.9 0.2 8.5 7.3 14.1 B Elbow Wrist 54 54 0  B Elbow 7.0 6.7 0.3 8.3 7.0 15.7 A Elbow B Elbow 83 87 4  A Elbow 8.2 7.9 0.3 8.4 6.9 17.9          Waveforms:                     Clinical History: MRI CERVICAL SPINE WITHOUT CONTRAST  TECHNIQUE: Multiplanar, multisequence MR imaging of the cervical spine was performed. No intravenous contrast was administered.   COMPARISON:  September 04, 2017 cervical spine MRI  FINDINGS: Alignment: Straightening of normal cervical lordosis. No spondylolisthesis.  Vertebrae: Posterior spinal fusion hardware extends from C4-T2 with ACDF hardware at C7-T1  Cord: Normal signal and morphology.  Posterior Fossa, vertebral arteries, paraspinal tissues: Negative.  Disc levels:  C1-C2: Normal.  C2-C3: Normal disc space and facets. No spinal canal or neuroforaminal stenosis.  C3-C4: Posterior fusion. No spinal canal or neural foraminal stenosis.  C4-C5: Posterior fusion. No spinal canal or neural foraminal stenosis.  C5-C6: Posterior fusion. Small central disc protrusion is unchanged. No spinal canal or neural foraminal stenosis.  C6-C7: Laminectomy without spinal canal stenosis.  C7-T1: Laminectomy without spinal canal stenosis.  IMPRESSION: Unchanged postoperative appearance of the cervical spine with posterior fusion from C4-T2 and ACDF at C7-T1. No spinal canal stenosis or neural impingement.   Electronically Signed   By: Ulyses Jarred M.D.   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   There is also evidence of a borderline right median nerve entrapment at the wrist (carpal tunnel syndrome) affecting sensory components.   There  is no significant electrodiagnostic evidence of any other focal nerve entrapment or brachial plexopathy.   Recommendations: 1. Follow-up with referring physician. 2. Continue current management of symptoms.  ___________________________ Laurence Spates FAAPMR   She reports that she has been smoking cigarettes. She started smoking about 41 years ago. She has a 10.00 pack-year smoking history. She has never used smokeless tobacco.  Recent Labs    03/06/18 1405 05/29/18 1435 07/10/18 1520  HGBA1C 8.6*  10.3* 9.4*    Objective:  VS:  HT:    WT:   BMI:     BP:   HR: bpm  TEMP: ( )  RESP:  Physical Exam Musculoskeletal:        General: No swelling, tenderness or deformity.     Comments: Inspection reveals mild atrophy of the FDI but no atrophy of the bilateral APB or hand intrinsics. There is no swelling, color changes, allodynia or dystrophic changes. There is 5 out of 5 strength in the bilateral wrist extension, finger abduction and long finger flexion. There is intact sensation to light touch in all dermatomal and peripheral nerve distributions. There is a negative Tinel's test at the bilateral wrist and elbow. There is a negative Phalen's test bilaterally. There is a negative Hoffmann's test bilaterally.  Skin:    General: Skin is warm and dry.     Findings: No erythema or rash.  Neurological:     General: No focal deficit present.     Mental Status: She is alert and oriented to person, place, and time.     Motor: No weakness or abnormal muscle tone.     Coordination: Coordination normal.  Psychiatric:        Mood and Affect: Mood normal.        Behavior: Behavior normal.     Ortho Exam Imaging: No results found.  Past Medical/Family/Surgical/Social History: Medications & Allergies reviewed per EMR, new medications updated. Patient Active Problem List   Diagnosis Date Noted  . Chest pain 09/24/2018  . Tobacco abuse 08/21/2018  . Right groin pain 07/20/2018  . Depressed mood 07/13/2018  . Mouth pain 06/02/2018  . Paroxysmal SVT (supraventricular tachycardia) ([place]) 03/20/2018  . Pure hypercholesterolemia 03/20/2018  . Muscle cramping 02/25/2018  . Chest pressure 02/20/2018  . Nodule of upper lobe of right lung 02/13/2018  . Falls, subsequent encounter 01/30/2018  . Open wound of left foot 11/04/2017  . Essential hypertension 10/27/2017  . Solitary pulmonary nodule 10/07/2017  . Acute on chronic right-sided congestive heart failure ([place])   . Restrictive lung disease  secondary to obesity 10/01/2017  . Polycythemia, secondary 10/01/2017  . Chronic viral hepatitis B without delta-agent ([place]) 07/08/2017  . COPD (chronic obstructive pulmonary disease) with chronic bronchitis ([place]) 06/27/2017  . Chronic kidney disease (CKD), stage IV (severe) ([place]) 01/14/2017  . Type 2 diabetes mellitus with stage 3 chronic kidney disease, with long-term current use of insulin ([place]) 12/12/2016  . Urge incontinence of urine 12/05/2016  . Trigger index finger of left hand 07/15/2016  . Back pain 04/07/2014  . Morbid obesity ([place]) 10/02/2012  . Chronic diastolic CHF (congestive heart failure) ([place]) 04/07/2012  . GERD 01/26/2010  . Hypertension 08/04/2008  . Obstructive sleep apnea treated with BiPAP 02/03/2008  . FIBROCYSTIC BREAST DISEASE 10/28/2006  . Hyperlipidemia 03/27/2006  . Obesity hypoventilation syndrome ([place]) 03/27/2006  . Former tobacco use 03/27/2006  . Depression with anxiety 03/27/2006   Past Medical History:  Diagnosis Date  . Anxiety   . Arthritis  04-10-11   hips, shoulders, back  . Asthma   . Bipolar disorder ([place])   . Cancer ([place]) 1993   cervical, no treatment done, went away per pt  . Cervical dysplasia or atypia 04-10-11   '93- once dx.-got pregnant-no intervention, then postpartum, no dysplasia found  . CHF (congestive heart failure) ([place])    no cardiologist 2014 dx, none now  . Chronic kidney disease    stage 3 kidney disease  . 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 Pathology showed Condyloma, gluteal CONDYLOMA ACUMINATUM.   [name] Kitchen COPD (chronic obstructive pulmonary disease) ([place])   . Diabetes mellitus without complication ([place])   . Dyspnea    with actity, sitting  . Fall 12/16/2016  . Fibrocystic breast disease   . GERD (gastroesophageal reflux disease)   . Hepatitis    hep B-count is low at present,doesn't register  . Hypercalcemia   . Hyperlipidemia   . Hypertension   . Lung nodule 11/2017   right and left  lung  . Migraine headache    none recent  . Morbid obesity ([place]) 03/27/2006  . Neuropathy   . Pneumonia    walking pneu 15 yrs. ago  . Skin lesion 03/15/2011   In gluteal crease now s/p removal by Dr. [name] of General Surgery on 3/12. Path shows condyloma.     . Sleep apnea 04-10-11   uses cpap, pt does not know settings  . Sleep apnea   . Trigger finger    left third   Family History  Problem Relation Age of Onset  . Pulmonary embolism Mother   . Stroke Father   . Alcohol abuse Father   . Pneumonia Father   . Cancer Maternal Aunt        breast  . Breast cancer Maternal Aunt   . Asthma Other   . COPD Other   . Hypertension Other   . Diabetes Other   . Asthma Sister   . Depression Sister   . Arthritis Sister   . Sickle cell anemia Son    Past Surgical History:  Procedure Laterality Date  . ANAL FISTULECTOMY  04/17/2011   Procedure: FISTULECTOMY ANAL;  Surgeon: Imogene Burn. [name], MD;  Location: [facility];  Service: General;  Laterality: N/A;  Excision of Condyloma Gluteal Cleft   . BACK SURGERY  04-10-11   x5-Lumbar fusion-retained hardware.(Dr. Louanne Skye)  . BREAST CYST EXCISION     bilateral breast, 3 cysts removed from each breast  . BREAST EXCISIONAL BIOPSY Right    x 3  . BREAST EXCISIONAL BIOPSY Left    x 3  . CARPAL TUNNEL RELEASE Bilateral   . Cervical biospy    . COLONOSCOPY WITH PROPOFOL N/A 07/06/2015   Procedure: COLONOSCOPY WITH PROPOFOL;  Surgeon: [name], MD;  Location: [facility];  Service: Endoscopy;  Laterality: N/A;  . DOPPLER ECHOCARDIOGRAPHY  04/06/2012   AT [facility] 55-60%  . FRACTURE SURGERY    . I&D EXTREMITY Left 12/18/2016   Procedure: IRRIGATION AND DEBRIDEMENT LEFT FOOT, CLOSURE;  Surgeon: [name], MD;  Location: [facility];  Service: Orthopedics;  Laterality: Left;  [name] Kitchen MULTIPLE TOOTH EXTRACTIONS    . NECK SURGERY  04-10-11   x3- cervical fusion with plating and screws-Dr. [name]  . TEE WITHOUT CARDIOVERSION N/A 04/06/2012   Procedure:  TRANSESOPHAGEAL ECHOCARDIOGRAM (TEE);  Surgeon: [name], MD;  Location[facility];  Service: Cardiovascular;  Laterality: N/A;  . TRIGGER FINGER RELEASE  Right    middle finger  . TRIGGER FINGER RELEASE Left 06/28/2016   Procedure: RELEASE TRIGGER FINGER LEFT 3RD FINGER;  Surgeon: [name], MD;  Location: [facility];  Service: Orthopedics;  Laterality: Left;  . TRIGGER FINGER RELEASE Right 06/12/2016   Procedure: RIGHT INDEX FINGER TRIGGER RELEASE;  Surgeon: [name], MD;  Location: [facility];  Service: Orthopedics;  Laterality: Right;  . TRIGGER FINGER RELEASE Right 01/19/2018   Procedure: RIGHT RING FINGER TRIGGER FINGER RELEASE;  Surgeon: [name], MD;  Location: [facility];  Service: Orthopedics;  Laterality: Right;   Social History   Occupational History    Comment: Disabled  Tobacco Use  . Smoking status: Current Some Day Smoker    Packs/day: 0.25    Years: 40.00    Pack years: 10.00    Types: Cigarettes    Start date: 01/28/1977    Last attempt to quit: 09/07/2017    Years since quitting: 1.1  . Smokeless tobacco: Never Used  . Tobacco comment: Quit prior to hospitalization using low dose Chantix  Substance and Sexual Activity  . Alcohol use: No    Alcohol/week: 0.0 standard drinks  . Drug use: Not Currently    Types: Marijuana, "Crack" cocaine    Comment: hx marijuana usemany years ago, Crack -none since 11/2015  . Sexual activity: Not Currently    Partners: Male    Birth control/protection: Post-menopausal

## 2018-10-26 NOTE — Procedures (Signed)
EMG & NCV Findings: Evaluation of the left median (across palm) sensory nerve showed no response (Palm).  The right median (across palm) sensory nerve showed no response (Palm) and prolonged distal peak latency (3.8 ms).  All remaining nerves (as indicated in the following tables) were within normal limits.  All left vs. right side differences were within normal limits.    Needle evaluation of the right abductor pollicis brevis muscle showed increased insertional activity.  The right first dorsal interosseous and the left abductor pollicis brevis muscles showed increased insertional activity and diminished recruitment.  The left first dorsal interosseous muscle showed increased insertional activity, widespread spontaneous activity, increased motor unit amplitude, and diminished recruitment.  All remaining muscles (as indicated in the following table) showed no evidence of electrical instability.    Impression: The above electrodiagnostic study is ABNORMAL and reveals evidence of moderate subacute on chronic C8 and T1 radiculopathy on the left >right.  *Nerve conductions do not show any evidence of a demyelinating polyneuropathy but I cannot totally rule out the needle EMG findings being a axonal neuropathy which could still be related to diabetes or some other nerve insult.  There is some evidence of very mild right median nerve entrapment at the wrist.    There is no significant electrodiagnostic evidence of any other focal nerve entrapment, brachial plexopathy or generalized peripheral neuropathy.   Recommendations: 1.  Follow-up with referring physician. 2.  Continue current management of symptoms.  ___________________________ Laurence Spates FAAPMR Board Certified, American Board of Physical Medicine and Rehabilitation    Nerve Conduction Studies Anti Sensory Summary Table   Stim Site NR Peak (ms) Norm Peak (ms) P-T Amp (V) Norm P-T Amp Site1 Site2 Delta-P (ms) Dist (cm) Vel (m/s) Norm Vel  (m/s)  Left Median Acr Palm Anti Sensory (2nd Digit)  32.6C  Wrist    3.6 <3.6 16.9 >10 Wrist Palm  0.0    Palm *NR  <2.0          Right Median Acr Palm Anti Sensory (2nd Digit)  32.4C  Wrist    *3.8 <3.6 19.0 >10 Wrist Palm  0.0    Palm *NR  <2.0          Left Radial Anti Sensory (Base 1st Digit)  32C  Wrist    2.0 <3.1 32.5  Wrist Base 1st Digit 2.0 0.0    Right Radial Anti Sensory (Base 1st Digit)  32.8C  Wrist    2.1 <3.1 32.9  Wrist Base 1st Digit 2.1 0.0    Left Ulnar Anti Sensory (5th Digit)  32.5C  Wrist    3.3 <3.7 19.7 >15.0 Wrist 5th Digit 3.3 14.0 42 >38  Right Ulnar Anti Sensory (5th Digit)  32.8C  Wrist    3.2 <3.7 28.0 >15.0 Wrist 5th Digit 3.2 14.0 44 >38   Motor Summary Table   Stim Site NR Onset (ms) Norm Onset (ms) O-P Amp (mV) Norm O-P Amp Site1 Site2 Delta-0 (ms) Dist (cm) Vel (m/s) Norm Vel (m/s)  Left Median Motor (Abd Poll Brev)  31.7C  Wrist    3.6 <4.2 10.5 >5 Elbow Wrist 4.5 22.5 50 >50  Elbow    8.1  5.6         Right Median Motor (Abd Poll Brev)  33C  Wrist    3.8 <4.2 7.7 >5 Elbow Wrist 4.3 21.5 50 >50  Elbow    8.1  6.9         Left Ulnar Motor (Abd Dig  Min)  31.4C  Wrist    3.1 <4.2 8.5 >3 B Elbow Wrist 3.9 21.0 54 >53  B Elbow    7.0  8.3  A Elbow B Elbow 1.2 10.0 83 >53  A Elbow    8.2  8.4         Right Ulnar Motor (Abd Dig Min)  32.9C  Wrist    2.9 <4.2 7.3 >3 B Elbow Wrist 3.8 20.5 54 >53  B Elbow    6.7  7.0  A Elbow B Elbow 1.2 10.5 87 >53  A Elbow    7.9  6.9          EMG   Side Muscle Nerve Root Ins Act Fibs Psw Amp Dur Poly Recrt Int Fraser Din Comment  Right Abd Poll Brev Median C8-T1 *Incr Nml Nml Nml Nml 0 Nml Nml   Right 1stDorInt Ulnar C8-T1 *Incr Nml Nml Nml Nml 0 *Reduced Nml   Right PronatorTeres Median C6-7 Nml Nml Nml Nml Nml 0 Nml Nml   Left Abd Poll Brev Median C8-T1 *Incr Nml Nml Nml Nml 0 *Reduced Nml   Left 1stDorInt Ulnar C8-T1 *Incr *4+ *4+ *Incr Nml 0 *Reduced Nml   Left PronatorTeres Median C6-7 Nml Nml Nml Nml  Nml 0 Nml Nml   Left Biceps Musculocut C5-6 Nml Nml Nml Nml Nml 0 Nml Nml   Left Deltoid Axillary C5-6 Nml Nml Nml Nml Nml 0 Nml Nml     Nerve Conduction Studies Anti Sensory Left/Right Comparison   Stim Site L Lat (ms) R Lat (ms) L-R Lat (ms) L Amp (V) R Amp (V) L-R Amp (%) Site1 Site2 L Vel (m/s) R Vel (m/s) L-R Vel (m/s)  Median Acr Palm Anti Sensory (2nd Digit)  32.6C  Wrist 3.6 *3.8 0.2 16.9 19.0 11.1 Wrist Palm     Palm             Radial Anti Sensory (Base 1st Digit)  32C  Wrist 2.0 2.1 0.1 32.5 32.9 1.2 Wrist Base 1st Digit     Ulnar Anti Sensory (5th Digit)  32.5C  Wrist 3.3 3.2 0.1 19.7 28.0 29.6 Wrist 5th Digit 42 44 2   Motor Left/Right Comparison   Stim Site L Lat (ms) R Lat (ms) L-R Lat (ms) L Amp (mV) R Amp (mV) L-R Amp (%) Site1 Site2 L Vel (m/s) R Vel (m/s) L-R Vel (m/s)  Median Motor (Abd Poll Brev)  31.7C  Wrist 3.6 3.8 0.2 10.5 7.7 26.7 Elbow Wrist 50 50 0  Elbow 8.1 8.1 0.0 5.6 6.9 18.8       Ulnar Motor (Abd Dig Min)  31.4C  Wrist 3.1 2.9 0.2 8.5 7.3 14.1 B Elbow Wrist 54 54 0  B Elbow 7.0 6.7 0.3 8.3 7.0 15.7 A Elbow B Elbow 83 87 4  A Elbow 8.2 7.9 0.3 8.4 6.9 17.9          Waveforms:

## 2018-10-26 NOTE — Patient Instructions (Signed)
Your Plan:  Restart using BiPAP nightly and greater than 4 hours each night If your symptoms worsen or you develop new symptoms please let us know.    Thank you for coming to see [place] at [facility]. I hope we have been able to provide you high quality care today.  You may receive a patient satisfaction survey over the next few weeks. We would appreciate your feedback and comments so that we may continue to improve ourselves and the health of our patients.

## 2018-10-26 NOTE — Progress Notes (Addendum)
PATIENT: [name] DOB: [id number]  REASON FOR VISIT: follow up HISTORY FROM: patient  HISTORY OF PRESENT ILLNESS: Today 10/26/18:  [name] is a 53 year old female with a history of obstructive sleep apnea on BiPAP.  She returns today for follow-up.  She reports that she has not been using the BiPAP. reports that the last time she used it was approximately in March.  She states that she was sent the wrong mask.  She states that she did reach out to her DME company but according to the patient theydid not send her a corrected size.  She states therefore the mask kept coming off and so she stopped using it.  She states that she does sleep better with the BiPAP.  She is continue to use her supplemental oxygen.  She would like to get restarted.  HISTORY 04/21/2018: Please see below for phone call visit.  I reviewed her BiPAP compliance data from 03/22/2018 through 04/20/2018 which is a total of 30 days, during which time she used her machine every night with percent used days greater than 4 hours at 93%, indicating excellent compliance with an average usage of 6 hours and 14 minutes, residual AHI at goal at 1.2 per hour, leak on the high side with the 95th percentile at 27.7 L per minute on a pressure of 20/16.  The patient's allergies, current medications, family history, past medical history, past social history, past surgical history and problem list were reviewed and updated as appropriate.   REVIEW OF SYSTEMS: Out of a complete 14 system review of symptoms, the patient complains only of the following symptoms, and all other reviewed systems are negative.  Epworth sleepiness score 12  ALLERGIES: Allergies  Allergen Reactions  . Aspirin Other (See Comments)    Kidney disease  . Chantix [Varenicline Tartrate] Other (See Comments)    On the 1 mg dose, patient experienced hallucintation  . 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 Corticosteroids  Other (See Comments)    Hyperglycemia   . Levofloxacin Itching    HOME MEDICATIONS: Outpatient Medications Prior to Visit  Medication Sig Dispense Refill  . ACCU-CHEK SOFTCLIX LANCETS lancets TEST 4 TIMES DAILY 200 each 6  . Acetaminophen (TYLENOL EXTRA STRENGTH PO) Take 1,000 mg by mouth 2 (two) times a day.    [name] Kitchen ammonium lactate (AMLACTIN) 12 % lotion Apply 1 application topically 2 (two) times daily as needed for dry skin. 400 g 0  . atorvastatin (LIPITOR) 40 MG tablet Take 1 tablet (40 mg total) by mouth daily. 90 tablet 3  . baclofen (LIORESAL) 10 MG tablet Take 1 tablet (10 mg total) by mouth 3 (three) times daily as needed for muscle spasms. 90 each 0  . blood glucose meter kit and supplies KIT Dispense based on patient and insurance preference. Use up to four times daily as directed. (FOR ICD-9 250.00, 250.01). 1 each 0  . busPIRone (BUSPAR) 10 MG tablet TAKE 1 TABLET BY MOUTH THREE TIMES A DAY (Patient taking differently: Take 10 mg by mouth 3 (three) times daily. ) 270 tablet 4  . Calcium Citrate-Vitamin D (CALCIUM CITRATE + D3 PO) Take 1 tablet by mouth daily.    . Capsaicin-Menthol (PAIN RELIEF EX) Apply 1 application topically 3 (three) times daily as needed (shoulder pain).    [name] Kitchen diclofenac sodium (VOLTAREN) 1 % GEL APPLY 2 GRAMS TO AFFECTED AREA 4 TIMES A DAY (Patient taking differently: Apply 4  g topically 4 (four) times daily. Per pt: applies all over body) 300 g 2  . diltiazem (CARDIZEM CD) 120 MG 24 hr capsule Take 1 capsule (120 mg total) by mouth 2 (two) times daily. 180 capsule 3  . docusate sodium (COLACE) 100 MG capsule Take 200 mg by mouth 2 (two) times daily.    . empagliflozin (JARDIANCE) 25 MG TABS tablet Take 25 mg by mouth daily. 2250 mg 2  . febuxostat (ULORIC) 40 MG tablet Take 40 mg by mouth at bedtime.     . Flaxseed, Linseed, (FLAXSEED OIL) 1000 MG CAPS Take 1,000 mg by mouth daily.    . fluticasone (FLONASE) 50 MCG/ACT nasal spray Place 2 sprays into both  nostrils 2 (two) times daily. 16 g 6  . Fluticasone-Umeclidin-Vilant (TRELEGY ELLIPTA) 100-62.5-25 MCG/INH AEPB Inhale 1 puff into the lungs daily. 1 each 0  . folic acid (FOLVITE) 324 MCG tablet Take 400 mcg by mouth daily.    . furosemide (LASIX) 40 MG tablet Take 2 tablets (80 mg total) by mouth 2 (two) times daily. 180 tablet 3  . gabapentin (NEURONTIN) 300 MG capsule TAKE 2 CAPSULES (600 MG TOTAL) BY MOUTH 3 (THREE) TIMES DAILY. 540 capsule 2  . glucose blood (ACCU-CHEK AVIVA PLUS) test strip TEST 4 TIMES DAILY (Patient taking differently: 1 each by Other route 3 (three) times daily. TEST 3 TIMES DAILY) 100 each 12  . HYDROcodone-acetaminophen (NORCO) 7.5-325 MG tablet Take 1 tablet by mouth every 6 (six) hours as needed for moderate pain. 20 tablet 0  . hydrOXYzine (ATARAX/VISTARIL) 10 MG tablet TAKE 1 TABLET BY MOUTH EVERY DAY AS NEEDED (Patient taking differently: Take 10 mg by mouth daily as needed for itching. ) 90 tablet 1  . Insulin Glargine (LANTUS SOLOSTAR) 100 UNIT/ML Solostar Pen Inject 22 Units into the skin daily before breakfast. (Patient taking differently: Inject 20 Units into the skin daily before breakfast. ) 15 mL   . insulin lispro (HUMALOG KWIKPEN) 100 UNIT/ML KwikPen Inject 0.06 mLs (6 Units total) into the skin 3 (three) times daily before meals. 15 mL   . Insulin Pen Needle (B-D UF III MINI PEN NEEDLES) 31G X 5 MM MISC CHECK SUGARS 4 TIMES A DAY BEFORE MEALS AND AT BEDTIME. 100 each 12  . ketoconazole (NIZORAL) 2 % cream APPLY 1 FINGERTIP AMOUNT TO EACH FOOT DAILY. (Patient taking differently: Apply 1 application topically daily. Apply 1 fingertip amount to each foot daily.) 30 g 0  . loratadine (CLARITIN) 10 MG tablet Take 1 tablet (10 mg total) by mouth daily. 90 tablet 0  . losartan (COZAAR) 100 MG tablet Take 1 tablet (100 mg total) by mouth daily. 90 tablet 3  . metolazone (ZAROXOLYN) 2.5 MG tablet Take 1 tablet (2.5 mg total) by mouth 2 (two) times a week. TAKE 1  TABLET TWICE WEEKLY ON MONDAYS AND THURSDAYS ONLY 15 tablet 6  . montelukast (SINGULAIR) 10 MG tablet TAKE 1 TABLET AT BEDTIME (Patient taking differently: Take 10 mg by mouth at bedtime. ) 90 tablet 2  . Multiple Vitamins-Minerals (MULTIVITAMIN WITH MINERALS) tablet Take 1 tablet by mouth daily.    . mupirocin ointment (BACTROBAN) 2 % APPLY TO AFFECTED AREA TWICE A DAY (Patient taking differently: Apply 1 application topically 2 (two) times daily. ) 22 g 0  . NARCAN 4 MG/0.1ML LIQD nasal spray kit Place 1 spray into the nose once.     . NON FORMULARY Uses a C-PAP at bedtime    .  nystatin (MYCOSTATIN) 100000 UNIT/ML suspension TAKE 5 MLS (500,000 UNITS TOTAL) BY MOUTH 4 (FOUR) TIMES DAILY. SWISH AND SWALLOW. 60 mL 3  . Omega-3 Fatty Acids (FISH OIL) 1000 MG CAPS Take 1,000 mg by mouth daily.     [name] Kitchen omeprazole (PRILOSEC) 20 MG capsule Take 1 capsule (20 mg total) by mouth daily as needed. 90 capsule 3  . potassium chloride SA (KLOR-CON M20) 20 MEQ tablet Take 1 tablet (20 mEq total) by mouth 2 (two) times a week. Take 1 tablet twice weekly on Monday and Thursday. 15 tablet 6  . PROAIR HFA 108 (90 Base) MCG/ACT inhaler INHALE 2 PUFFS INTO THE LUNGS EVERY 6 (SIX) HOURS AS NEEDED FOR SHORTNESS OF BREATH. (Patient taking differently: Inhale 2 puffs into the lungs every 6 (six) hours as needed for shortness of breath. ) 8.5 Inhaler 1  . promethazine (PHENERGAN) 25 MG tablet Take 1 tablet (25 mg total) by mouth every 6 (six) hours as needed for nausea. 30 tablet 1  . sertraline (ZOLOFT) 50 MG tablet Take 1 tablet (50 mg total) by mouth daily. 30 tablet 0  . TOVIAZ 8 MG TB24 tablet Take 8 mg by mouth daily.    . varenicline (CHANTIX) 0.5 MG tablet Take 1 tablet (0.5 mg total) by mouth 2 (two) times daily. 60 tablet 1  . Vitamin D, Ergocalciferol, (DRISDOL) 50000 units CAPS capsule Take 50,000 Units by mouth daily. For 30 days     Facility-Administered Medications Prior to Visit  Medication Dose Route  Frequency Provider Last Rate Last Dose  . technetium tetrofosmin (TC-MYOVIEW) injection 46.8 millicurie  03.2 millicurie Intravenous Once PRN Sueanne Margarita, MD        PAST MEDICAL HISTORY: Past Medical History:  Diagnosis Date  . Anxiety   . Arthritis 04-10-11   hips, shoulders, back  . Asthma   . Bipolar disorder ([place])   . Cancer ([place]) 1993   cervical, no treatment done, went away per pt  . Cervical dysplasia or atypia 04-10-11   '93- once dx.-got pregnant-no intervention, then postpartum, no dysplasia found  . CHF (congestive heart failure) ([place])    no cardiologist 2014 dx, none now  . Chronic kidney disease    stage 3 kidney disease  . 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 Pathology showed Condyloma, gluteal CONDYLOMA ACUMINATUM.   [name] Kitchen COPD (chronic obstructive pulmonary disease) ([place])   . Diabetes mellitus without complication ([place])   . Dyspnea    with actity, sitting  . Fall 12/16/2016  . Fibrocystic breast disease   . GERD (gastroesophageal reflux disease)   . Hepatitis    hep B-count is low at present,doesn't register  . Hypercalcemia   . Hyperlipidemia   . Hypertension   . Lung nodule 11/2017   right and left lung  . Migraine headache    none recent  . Morbid obesity ([place]) 03/27/2006  . Neuropathy   . Pneumonia    walking pneu 15 yrs. ago  . Skin lesion 03/15/2011   In gluteal crease now s/p removal by Dr. [name] of General Surgery on 3/12. Path shows condyloma.     . Sleep apnea 04-10-11   uses cpap, pt does not know settings  . Sleep apnea   . Trigger finger    left third    PAST SURGICAL HISTORY: Past Surgical History:  Procedure Laterality Date  . ANAL FISTULECTOMY  04/17/2011   Procedure: FISTULECTOMY ANAL;  Surgeon: Imogene Burn. Tsuei, MD;  Location: [facility];  Service: General;  Laterality: N/A;  Excision of Condyloma Gluteal Cleft   . BACK SURGERY  04-10-11   x5-Lumbar fusion-retained hardware.(Dr. Louanne Skye)  . BREAST CYST  EXCISION     bilateral breast, 3 cysts removed from each breast  . BREAST EXCISIONAL BIOPSY Right    x 3  . BREAST EXCISIONAL BIOPSY Left    x 3  . CARPAL TUNNEL RELEASE Bilateral   . Cervical biospy    . COLONOSCOPY WITH PROPOFOL N/A 07/06/2015   Procedure: COLONOSCOPY WITH PROPOFOL;  Surgeon: [name], MD;  Location: [facility];  Service: Endoscopy;  Laterality: N/A;  . DOPPLER ECHOCARDIOGRAPHY  04/06/2012   AT [place] 55-60%  . FRACTURE SURGERY    . I&D EXTREMITY Left 12/18/2016   Procedure: IRRIGATION AND DEBRIDEMENT LEFT FOOT, CLOSURE;  Surgeon: [name], MD;  Location: [facility];  Service: Orthopedics;  Laterality: Left;  [name] Kitchen MULTIPLE TOOTH EXTRACTIONS    . NECK SURGERY  04-10-11   x3- cervical fusion with plating and screws-Dr. [name]  . TEE WITHOUT CARDIOVERSION N/A 04/06/2012   Procedure: TRANSESOPHAGEAL ECHOCARDIOGRAM (TEE);  Surgeon: [name], MD;  Location: [facility] ENDOSCOPY;  Service: Cardiovascular;  Laterality: N/A;  . TRIGGER FINGER RELEASE Right    middle finger  . TRIGGER FINGER RELEASE Left 06/28/2016   Procedure: RELEASE TRIGGER FINGER LEFT 3RD FINGER;  Surgeon: [name], MD;  Location: [facility];  Service: Orthopedics;  Laterality: Left;  . TRIGGER FINGER RELEASE Right 06/12/2016   Procedure: RIGHT INDEX FINGER TRIGGER RELEASE;  Surgeon: [name], MD;  Location: [facility];  Service: Orthopedics;  Laterality: Right;  . TRIGGER FINGER RELEASE Right 01/19/2018   Procedure: RIGHT RING FINGER TRIGGER FINGER RELEASE;  Surgeon: [name], MD;  Location: [facility];  Service: Orthopedics;  Laterality: Right;    FAMILY HISTORY: Family History  Problem Relation Age of Onset  . Pulmonary embolism Mother   . Stroke Father   . Alcohol abuse Father   . Pneumonia Father   . Cancer Maternal Aunt        breast  . Breast cancer Maternal Aunt   . Asthma Other   . COPD Other   . Hypertension Other   . Diabetes Other   . Asthma Sister   . Depression Sister   .  Arthritis Sister   . Sickle cell anemia Son     SOCIAL HISTORY: Social History   Socioeconomic History  . Marital status: Single    Spouse name: Not on file  . Number of children: 1  . Years of education: 74  . Highest education level: 12th grade  Occupational History    Comment: Disabled  Social Needs  . Financial resource strain: Not hard at all  . Food insecurity    Worry: Never true    Inability: Never true  . Transportation needs    Medical: No    Non-medical: No  Tobacco Use  . Smoking status: Current Some Day Smoker    Packs/day: 0.25    Years: 40.00    Pack years: 10.00    Types: Cigarettes    Start date: 01/28/1977    Last attempt to quit: 09/07/2017    Years since quitting: 1.1  . Smokeless tobacco: Never Used  . Tobacco comment: Quit prior to hospitalization using low dose Chantix  Substance and Sexual Activity  . Alcohol use: No    Alcohol/week: 0.0 standard drinks  . Drug use:  Not Currently    Types: Marijuana, "Crack" cocaine    Comment: hx marijuana usemany years ago, Crack -none since 11/2015  . Sexual activity: Not Currently    Partners: Male    Birth control/protection: Post-menopausal  Lifestyle  . Physical activity    Days per week: 0 days    Minutes per session: 0 min  . Stress: Not at all  Relationships  . Social connections    Talks on phone: More than three times a week    Gets together: Twice a week    Attends religious service: 1 to 4 times per year    Active member of club or organization: No    Attends meetings of clubs or organizations: Never    Relationship status: Divorced  . Intimate partner violence    Fear of current or ex partner: No    Emotionally abused: No    Physically abused: No    Forced sexual activity: No  Other Topics Concern  . Not on file  Social History Narrative   Current Social History       Who lives at home: Patient lives alone in one level home; has 4 steps onto porch with a handrail. Has smoke alarms,  no throw rugs. Has elevated toilet. Has shower bench in tub with grab rails.    Transportation: Patient has own vehicle and drives herself    Important Relationships "Family and friends"    Pets: None    Likes to eat varied diet. Seafood, fish, roasted [place] breast, vegetables and salads and fruits.   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 I'm a lovable person."                                                                                                      PHYSICAL EXAM  Vitals:   10/26/18 0748  BP: 138/72  Pulse: 66  Temp: (!) 96.8 F (36 C)  TempSrc: Oral  Weight: (!) 380 lb (172.4 kg)  Height: '5\' 10"'  (1.778 m)   Body mass index is 54.52 kg/m. Generalized: Well developed, in no acute distress  Chest: Lungs clear to auscultation bilaterally  Neurological examination  Mentation: Alert oriented to time, place, history taking. Follows all commands speech and language fluent Cranial nerve II-XII: Extraocular movements were full, visual field were full on confrontational test Head turning and shoulder shrug  were normal and symmetric. Motor: The motor testing reveals 5 over 5 strength of all 4 extremities. Good symmetric motor tone is noted throughout.  Sensory: Sensory testing is intact to soft touch on all 4 extremities. No evidence of extinction is noted.  Gait and station: Gait is normal.   DIAGNOSTIC DATA (LABS, IMAGING, TESTING) - I reviewed patient  records, labs, notes, testing and imaging myself where available.  Lab Results  Component Value Date   WBC 10.4 09/24/2018   HGB 16.0 (H) 09/24/2018   HCT 46.7 (H) 09/24/2018   MCV 79.0 (L) 09/24/2018   PLT 282 09/24/2018      Component Value Date/Time   NA  138 09/25/2018 1022   NA 145 (H) 03/06/2018 1657   K 3.3 (L) 09/25/2018 1022   CL 97 (L) 09/25/2018 1022   CO2 27 09/25/2018 1022   GLUCOSE 182 (H) 09/25/2018 1022   BUN 27 (H) 09/25/2018 1022   BUN 36 (H) 03/06/2018 1657   CREATININE 1.51 (H) 09/25/2018 1022   CREATININE 1.54 (H) 11/30/2015 1112   CALCIUM 9.0 09/25/2018 1022   PROT 7.1 02/23/2018 1446   ALBUMIN 4.3 02/23/2018 1446   AST 23 02/23/2018 1446   ALT 26 02/23/2018 1446   ALKPHOS 102 02/23/2018 1446   BILITOT 0.2 02/23/2018 1446   GFRNONAA 39 (L) 09/25/2018 1022   GFRNONAA 39 (L) 11/30/2015 1112   GFRAA 45 (L) 09/25/2018 1022   GFRAA 44 (L) 11/30/2015 1112   Lab Results  Component Value Date   CHOL 146 09/24/2018   HDL 37 (L) 09/24/2018   LDLCALC 76 09/24/2018   LDLDIRECT 114 (H) 09/15/2012   TRIG 165 (H) 09/24/2018   CHOLHDL 3.9 09/24/2018   Lab Results  Component Value Date   HGBA1C 9.4 (A) 07/10/2018   No results found for: [id number] Lab Results  Component Value Date   TSH 0.469 03/06/2018      ASSESSMENT AND PLAN 53 y.o. year old female  has a past medical history of Anxiety, Arthritis (04-10-11), Asthma, Bipolar disorder ([place]), Cancer ([place]) (1993), Cervical dysplasia or atypia (04-10-11), CHF (congestive heart failure) ([place]), Chronic kidney disease, Condyloma - gluteal cleft (04/09/2011), COPD (chronic obstructive pulmonary disease) ([place]), Diabetes mellitus without complication ([place]), Dyspnea, Fall (12/16/2016), Fibrocystic breast disease, GERD (gastroesophageal reflux disease), Hepatitis, Hypercalcemia, Hyperlipidemia, Hypertension, Lung nodule (11/2017), Migraine headache, Morbid obesity ([place]) (03/27/2006), Neuropathy, Pneumonia, Skin lesion (03/15/2011), Sleep apnea (04-10-11), Sleep apnea, and Trigger finger. here with:  1.  Obstructive sleep apnea on BiPAP  An order was sent to her DME company to have a mask refitting.  She is encouraged to restart BiPAP when she gets a new mask.  She is encouraged to  use the machine nightly for greater than 4 hours each night.  We did discuss the risk associated with untreated sleep apnea.  She will follow-up in 6 months or sooner if needed.   I spent 15 minutes with the patient. 50% of this time was spent discussing her BiPAP   Ward Givens, MSN, NP-C 10/26/2018, 8:22 AM [facility] [address] [place], [place] [address] [phone]  I reviewed the above note and documentation by the Nurse Practitioner and agree with the history, exam, assessment and plan as outlined above. I was available for consultation. Star Age, MD, PhD [facility])

## 2018-10-27 ENCOUNTER — Other Ambulatory Visit: Payer: Self-pay

## 2018-10-27 ENCOUNTER — Ambulatory Visit (INDEPENDENT_AMBULATORY_CARE_PROVIDER_SITE_OTHER): Payer: Medicare Other | Admitting: Psychology

## 2018-10-27 ENCOUNTER — Other Ambulatory Visit: Payer: Self-pay | Admitting: Family Medicine

## 2018-10-27 DIAGNOSIS — F329 Major depressive disorder, single episode, unspecified: Secondary | ICD-10-CM

## 2018-10-27 DIAGNOSIS — R4589 Other symptoms and signs involving emotional state: Secondary | ICD-10-CM

## 2018-10-27 DIAGNOSIS — Z1231 Encounter for screening mammogram for malignant neoplasm of breast: Secondary | ICD-10-CM

## 2018-10-27 NOTE — Progress Notes (Signed)
[facility] received cpap orders 10-26-18. sy

## 2018-10-27 NOTE — BH Specialist Note (Signed)
Integrated Behavioral Health Visit via Telemedicine (Telephone)  02/24[birth year]5168 [name] [id number]   Session Start time: 2pm  Session End time: 220pm Total time: 20 minutes  Referring Provider: Self Type of Visit: Telephonic Patient location: Home [facility] Provider location: [facility] All persons participating in visit: Pt and provider   Confirmed patient's address: Yes  Confirmed patient's phone number: Yes  Any changes to demographics: No  Confirmed patient's insurance: Yes  Any changes to patient's insurance: No      The following statements were read to the patient and/or legal guardian that are established with the [facility] Provider.  "The purpose of this phone visit is to provide behavioral health care while limiting exposure to the coronavirus (COVID19).  There is a possibility of technology failure and discussed alternative modes of communication if that failure occurs."  "By engaging in this telephone visit, you consent to the provision of healthcare.  Additionally, you authorize for your insurance to be billed for the services provided during this telephone visit."   Patient and/or legal guardian consented to telephone visit: Yes   PRESENTING CONCERNS: Patient and/or family reports the following symptoms/concerns: Pt reported that her depressed mood has improved. Pt reports improved self-care and motivation to advocate for care.  Duration of problem: 3 months ; Severity of problem: mild  STRENGTHS (Protective Factors/Coping Skills): Pt engages in praying and self-care. Pt has identified support systems.   GOALS ADDRESSED: Patient will: 1.  Reduce symptoms of: stress  2.  Increase knowledge and/or ability of: coping skills  3.  Demonstrate ability to: Increase healthy adjustment to current life circumstances  INTERVENTIONS: Interventions utilized:  Brief CBT and reflective listening  Standardized Assessments completed: Not Needed  ASSESSMENT: Patient  currently experiencing less depressed mood. Pt reported she has been able to engage in self-care for herself as well as taking care of basic needs such as grocery shopping.  Pt reported that her medication (Zolft 50mg ) increase has been helpful for mood improvement.   Patient may benefit from continued brief CBT and reflective listening from provider in 1 month f/u.  PLAN: 1. Follow up with behavioral health clinician on : Managing self-care, challenging stressful thoughts 2. Behavioral recommendations: Pt will engage in self-care 3. Referral(s): [place] (In Clinic)  Erlinda Hong, PhD., LMFT-A

## 2018-10-29 ENCOUNTER — Encounter: Payer: Self-pay | Admitting: Adult Health

## 2018-10-29 ENCOUNTER — Ambulatory Visit: Payer: Medicare Other | Attending: Specialist | Admitting: Physical Therapy

## 2018-10-29 ENCOUNTER — Encounter: Payer: Self-pay | Admitting: Physical Therapy

## 2018-10-29 ENCOUNTER — Other Ambulatory Visit: Payer: Self-pay | Admitting: Family Medicine

## 2018-10-29 ENCOUNTER — Other Ambulatory Visit: Payer: Self-pay

## 2018-10-29 DIAGNOSIS — M545 Low back pain, unspecified: Secondary | ICD-10-CM

## 2018-10-29 DIAGNOSIS — M6281 Muscle weakness (generalized): Secondary | ICD-10-CM | POA: Insufficient documentation

## 2018-10-29 DIAGNOSIS — M542 Cervicalgia: Secondary | ICD-10-CM | POA: Diagnosis not present

## 2018-10-29 DIAGNOSIS — G8929 Other chronic pain: Secondary | ICD-10-CM | POA: Diagnosis not present

## 2018-10-29 DIAGNOSIS — R293 Abnormal posture: Secondary | ICD-10-CM

## 2018-10-29 NOTE — Therapy (Signed)
[place], [place], [address] Phone: [phone]   Fax:  [phone]  Physical Therapy Treatment  Patient Details  Name: [name] MRN: [id number] Date of Birth: [birth year] Referring Provider (PT): [name], MD   Encounter Date: 10/29/2018  PT End of Session - 10/29/18 0727    Visit Number  2    Number of Visits  16    Date for PT Re-Evaluation  12/11/18    Authorization Type  UHC MCR    PT Start Time  0715    PT Stop Time  0755    PT Time Calculation (min)  40 min       Past Medical History:  Diagnosis Date  . Anxiety   . Arthritis 04-10-11   hips, shoulders, back  . Asthma   . Bipolar disorder ([place])   . Cancer ([place]) 1993   cervical, no treatment done, went away per pt  . Cervical dysplasia or atypia 04-10-11   '93- once dx.-got pregnant-no intervention, then postpartum, no dysplasia found  . CHF (congestive heart failure) ([place])    no cardiologist 2014 dx, none now  . Chronic kidney disease    stage 3 kidney disease  . 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 Pathology showed Condyloma, gluteal CONDYLOMA ACUMINATUM.   [name] Kitchen COPD (chronic obstructive pulmonary disease) ([place])   . Diabetes mellitus without complication ([place])   . Dyspnea    with actity, sitting  . Fall 12/16/2016  . Fibrocystic breast disease   . GERD (gastroesophageal reflux disease)   . Hepatitis    hep B-count is low at present,doesn't register  . Hypercalcemia   . Hyperlipidemia   . Hypertension   . Lung nodule 11/2017   right and left lung  . Migraine headache    none recent  . Morbid obesity ([place]) 03/27/2006  . Neuropathy   . Pneumonia    walking pneu 15 yrs. ago  . Skin lesion 03/15/2011   In gluteal crease now s/p removal by Dr. [name] of General Surgery on 3/12. Path shows condyloma.     . Sleep apnea 04-10-11   uses cpap, pt does not know settings  . Sleep apnea   . Trigger finger     left third    Past Surgical History:  Procedure Laterality Date  . ANAL FISTULECTOMY  04/17/2011   Procedure: FISTULECTOMY ANAL;  Surgeon: Imogene Burn. [name], MD;  Location: [facility];  Service: General;  Laterality: N/A;  Excision of Condyloma Gluteal Cleft   . BACK SURGERY  04-10-11   x5-Lumbar fusion-retained hardware.(Dr. Louanne Skye)  . BREAST CYST EXCISION     bilateral breast, 3 cysts removed from each breast  . BREAST EXCISIONAL BIOPSY Right    x 3  . BREAST EXCISIONAL BIOPSY Left    x 3  . CARPAL TUNNEL RELEASE Bilateral   . Cervical biospy    . COLONOSCOPY WITH PROPOFOL N/A 07/06/2015   Procedure: COLONOSCOPY WITH PROPOFOL;  Surgeon: [name], MD;  [facility];  Service: Endoscopy;  Laterality: N/A;  . DOPPLER ECHOCARDIOGRAPHY  04/06/2012   AT [place] 55-60%  . FRACTURE SURGERY    . I&D EXTREMITY Left 12/18/2016   Procedure: IRRIGATION AND DEBRIDEMENT LEFT FOOT, CLOSURE;  Surgeon: [name], MD;  Location: [facility];  Service: Orthopedics;  Laterality: Left;  [name] Kitchen MULTIPLE TOOTH EXTRACTIONS    . NECK SURGERY  04-10-11   x3- cervical  fusion with plating and screws-Dr. [name]  . TEE WITHOUT CARDIOVERSION N/A 04/06/2012   Procedure: TRANSESOPHAGEAL ECHOCARDIOGRAM (TEE);  Surgeon: [name], MD;  Location: [facility] ENDOSCOPY;  Service: Cardiovascular;  Laterality: N/A;  . TRIGGER FINGER RELEASE Right    middle finger  . TRIGGER FINGER RELEASE Left 06/28/2016   Procedure: RELEASE TRIGGER FINGER LEFT 3RD FINGER;  Surgeon: [name], MD;  Location: [facility];  Service: Orthopedics;  Laterality: Left;  . TRIGGER FINGER RELEASE Right 06/12/2016   Procedure: RIGHT INDEX FINGER TRIGGER RELEASE;  Surgeon: [name], MD;  Location: [facility];  Service: Orthopedics;  Laterality: Right;  . TRIGGER FINGER RELEASE Right 01/19/2018   Procedure: RIGHT RING FINGER TRIGGER FINGER RELEASE;  Surgeon: [name], MD;  Location: [facility];  Service: Orthopedics;  Laterality: Right;     There were no vitals filed for this visit.  Subjective Assessment - 10/29/18 0732    Subjective  It still hurting    Currently in Pain?  Yes    Pain Score  9     Pain Location  Back    Pain Orientation  Lower    Pain Descriptors / Indicators  Aching    Pain Score  7   8 on left side of neck   Pain Location  Neck    Pain Orientation  Right;Left    Pain Descriptors / Indicators  Aching    Aggravating Factors   turning neck, looking up    Pain Relieving Factors  muscle rub, massaging it.                       [facility] Adult PT Treatment/Exercise - 10/29/18 0001      Self-Care   Self-Care  Other Self-Care Comments    Other Self-Care Comments   Use of theracane for trigger points, where to purchase       Lumbar Exercises: Stretches   Lower Trunk Rotation  10 seconds    Lower Trunk Rotation Limitations  10 reps with opposite cervical rotation     Pelvic Tilt  10 reps    Standing Side Bend Limitations  seated side bends 10 sec x3 each way       Lumbar Exercises: Supine   Clam  10 reps   bialteral and alternating   Clam Limitations  cues for abdominal draw in     Bent Knee Raise  10 reps    Bent Knee Raise Limitations  cues for abdominal draw in       Modalities   Modalities  Moist Heat      Moist Heat Therapy   Number Minutes Moist Heat  10 Minutes    Moist Heat Location  Lumbar Spine      Manual Therapy   Manual Therapy  Soft tissue mobilization    Soft tissue mobilization  Trigger point release bilateral upper traps, soft tisse to cervical paraspinals and upper traps    concurrent with HMP              PT Short Term Goals - 10/15/18 0744      PT SHORT TERM GOAL #1   Title  Pt will be I with HEP for strength and ROM  neck /back/finger    Time  3    Period  Weeks    Status  New      PT SHORT TERM GOAL #2   Title  She will report neck pain decreased 25%  or more    Time  4    Period  Weeks    Status  New      PT SHORT TERM GOAL #3    Title  she will report back pain decreased   25% or more    Time  4    Period  Weeks    Status  New      PT SHORT TERM GOAL #4   Title  Her finger will improve  extension by  degrees    Time  4    Period  Weeks    Status  New        PT Long Term Goals - 10/15/18 0746      PT LONG TERM GOAL #1   Title  Pt will be I with more advanced HEP for LEs , back and neck    Time  8    Period  Weeks    Status  New      PT LONG TERM GOAL #2   Title  She will report able to  sit for 45-60 min  due to decreaed back pain    Time  8    Period  Weeks    Status  New      PT LONG TERM GOAL #3   Title  She will be able to home tasks perform more    due to decreased  neck and back   pain    Time  8    Period  Weeks    Status  New      PT LONG TERM GOAL #4   Title  She will report pain decreased 50% or more  in back and neck so she is able to walk out of home for 15-30 min with min incr pain    Time  8    Period  Weeks    Status  New      PT LONG TERM GOAL #5   Title  FOTO score will improve to  70 % limited related to neck    Time  8    Period  Weeks    Status  New            Plan - 10/29/18 0801    Clinical Impression Statement  Pt arrives with 7/10 neck pain and 9-10/10 back pain. She declines emergency services. Reviewed HEP and progressed with ROM and core strengthening. Manual soft tissue work performed to neck musculature and HMP for lumbar pain. Afterward she reported feeling a little better. Instructed pt in use of theracane for self trigger point release. Handout provided for where to purchase a theracane. She has a TENS unit that she is unhappy with and I recommended she bring it into clinic for review of proper use.    PT Next Visit Plan  HEP , modalities and manual for ROM and pain update HEP next    PT Home Exercise Plan  PPT, LTR, shoulder rolls, thumb ROM       Patient will benefit from skilled therapeutic intervention in order to improve the following deficits  and impairments:  Difficulty walking, Obesity, Pain, Postural dysfunction, Decreased strength, Decreased activity tolerance, Increased muscle spasms  Visit Diagnosis: Muscle weakness (generalized)  Cervicalgia  Chronic bilateral low back pain, unspecified whether sciatica present  Abnormal posture     Problem List Patient Active Problem List   Diagnosis Date Noted  . Chest pain 09/24/2018  . Tobacco abuse 08/21/2018  . Right  groin pain 07/20/2018  . Depressed mood 07/13/2018  . Mouth pain 06/02/2018  . Paroxysmal SVT (supraventricular tachycardia) ([place]) 03/20/2018  . Pure hypercholesterolemia 03/20/2018  . Muscle cramping 02/25/2018  . Chest pressure 02/20/2018  . Nodule of upper lobe of right lung 02/13/2018  . Falls, subsequent encounter 01/30/2018  . Open wound of left foot 11/04/2017  . Essential hypertension 10/27/2017  . Solitary pulmonary nodule 10/07/2017  . Acute on chronic right-sided congestive heart failure ([place])   . Restrictive lung disease secondary to obesity 10/01/2017  . Polycythemia, secondary 10/01/2017  . Chronic viral hepatitis B without delta-agent ([place]) 07/08/2017  . COPD (chronic obstructive pulmonary disease) with chronic bronchitis ([place]) 06/27/2017  . Chronic kidney disease (CKD), stage IV (severe) ([place]) 01/14/2017  . Type 2 diabetes mellitus with stage 3 chronic kidney disease, with long-term current use of insulin ([place]) 12/12/2016  . Urge incontinence of urine 12/05/2016  . Trigger index finger of left hand 07/15/2016  . Back pain 04/07/2014  . Morbid obesity ([place]) 10/02/2012  . Chronic diastolic CHF (congestive heart failure) ([place]) 04/07/2012  . GERD 01/26/2010  . Hypertension 08/04/2008  . Obstructive sleep apnea treated with BiPAP 02/03/2008  . FIBROCYSTIC BREAST DISEASE 10/28/2006  . Hyperlipidemia 03/27/2006  . Obesity hypoventilation syndrome ([place]) 03/27/2006  . Former tobacco use 03/27/2006  . Depression with anxiety 03/27/2006     Dorene Ar, PTA 10/29/2018, 8:04 AM  [facility] [address] [place], [place], [address] Phone: [phone]   Fax:  [phone]  Name: [name] MRN: [id number] Date of Birth: [birth year]

## 2018-10-30 ENCOUNTER — Ambulatory Visit: Payer: Medicare Other | Admitting: Physical Therapy

## 2018-10-30 ENCOUNTER — Other Ambulatory Visit: Payer: Self-pay

## 2018-10-30 ENCOUNTER — Encounter: Payer: Self-pay | Admitting: Physical Therapy

## 2018-10-30 ENCOUNTER — Encounter: Payer: Self-pay | Admitting: Podiatry

## 2018-10-30 ENCOUNTER — Ambulatory Visit (INDEPENDENT_AMBULATORY_CARE_PROVIDER_SITE_OTHER): Payer: Medicare Other | Admitting: Podiatry

## 2018-10-30 DIAGNOSIS — M79674 Pain in right toe(s): Secondary | ICD-10-CM

## 2018-10-30 DIAGNOSIS — M542 Cervicalgia: Secondary | ICD-10-CM

## 2018-10-30 DIAGNOSIS — E1142 Type 2 diabetes mellitus with diabetic polyneuropathy: Secondary | ICD-10-CM | POA: Diagnosis not present

## 2018-10-30 DIAGNOSIS — M79675 Pain in left toe(s): Secondary | ICD-10-CM | POA: Diagnosis not present

## 2018-10-30 DIAGNOSIS — G8929 Other chronic pain: Secondary | ICD-10-CM

## 2018-10-30 DIAGNOSIS — M545 Low back pain, unspecified: Secondary | ICD-10-CM

## 2018-10-30 DIAGNOSIS — B351 Tinea unguium: Secondary | ICD-10-CM | POA: Diagnosis not present

## 2018-10-30 DIAGNOSIS — R293 Abnormal posture: Secondary | ICD-10-CM

## 2018-10-30 DIAGNOSIS — M6281 Muscle weakness (generalized): Secondary | ICD-10-CM | POA: Diagnosis not present

## 2018-10-30 NOTE — Therapy (Signed)
[place], [place], [address] Phone: [phone]   Fax:  [phone]  Physical Therapy Treatment  Patient Details  Name: [name] MRN: [id number] Date of Birth: [birth year] Referring Provider (PT): [name], MD   Encounter Date: 10/30/2018  PT End of Session - 10/30/18 0850    Visit Number  3    Number of Visits  16    Date for PT Re-Evaluation  12/11/18    Authorization Type  UHC MCR    PT Start Time  0845    PT Stop Time  0915    PT Time Calculation (min)  30 min       Past Medical History:  Diagnosis Date  . Anxiety   . Arthritis 04-10-11   hips, shoulders, back  . Asthma   . Bipolar disorder ([place])   . Cancer ([place]) 1993   cervical, no treatment done, went away per pt  . Cervical dysplasia or atypia 04-10-11   '93- once dx.-got pregnant-no intervention, then postpartum, no dysplasia found  . CHF (congestive heart failure) ([place])    no cardiologist 2014 dx, none now  . Chronic kidney disease    stage 3 kidney disease  . 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 Pathology showed Condyloma, gluteal CONDYLOMA ACUMINATUM.   [name] Kitchen COPD (chronic obstructive pulmonary disease) ([place])   . Diabetes mellitus without complication ([place])   . Dyspnea    with actity, sitting  . Fall 12/16/2016  . Fibrocystic breast disease   . GERD (gastroesophageal reflux disease)   . Hepatitis    hep B-count is low at present,doesn't register  . Hypercalcemia   . Hyperlipidemia   . Hypertension   . Lung nodule 11/2017   right and left lung  . Migraine headache    none recent  . Morbid obesity ([place]) 03/27/2006  . Neuropathy   . Pneumonia    walking pneu 15 yrs. ago  . Skin lesion 03/15/2011   In gluteal crease now s/p removal by Dr. [name] of General Surgery on 3/12. Path shows condyloma.     . Sleep apnea 04-10-11   uses cpap, pt does not know settings  . Sleep apnea   . Trigger finger     left third    Past Surgical History:  Procedure Laterality Date  . ANAL FISTULECTOMY  04/17/2011   Procedure: FISTULECTOMY ANAL;  Surgeon: Imogene Burn. [name], MD;  [facility];  Service: General;  Laterality: N/A;  Excision of Condyloma Gluteal Cleft   . BACK SURGERY  04-10-11   x5-Lumbar fusion-retained hardware.(Dr. Louanne Skye)  . BREAST CYST EXCISION     bilateral breast, 3 cysts removed from each breast  . BREAST EXCISIONAL BIOPSY Right    x 3  . BREAST EXCISIONAL BIOPSY Left    x 3  . CARPAL TUNNEL RELEASE Bilateral   . Cervical biospy    . COLONOSCOPY WITH PROPOFOL N/A 07/06/2015   Procedure: COLONOSCOPY WITH PROPOFOL;  Surgeon: [name], MD;  [facility];  Service: Endoscopy;  Laterality: N/A;  . DOPPLER ECHOCARDIOGRAPHY  04/06/2012   AT [place] 55-60%  . FRACTURE SURGERY    . I&D EXTREMITY Left 12/18/2016   Procedure: IRRIGATION AND DEBRIDEMENT LEFT FOOT, CLOSURE;  Surgeon: [name], MD;  Location: [facility];  Service: Orthopedics;  Laterality: Left;  [name] Kitchen MULTIPLE TOOTH EXTRACTIONS    . NECK SURGERY  04-10-11   x3- cervical  fusion with plating and screws-Dr. [name]  . TEE WITHOUT CARDIOVERSION N/A 04/06/2012   Procedure: TRANSESOPHAGEAL ECHOCARDIOGRAM (TEE);  Surgeon: [name], MD;  Location: [facility] ENDOSCOPY;  Service: Cardiovascular;  Laterality: N/A;  . TRIGGER FINGER RELEASE Right    middle finger  . TRIGGER FINGER RELEASE Left 06/28/2016   Procedure: RELEASE TRIGGER FINGER LEFT 3RD FINGER;  Surgeon: [name], MD;  Location: [facility];  Service: Orthopedics;  Laterality: Left;  . TRIGGER FINGER RELEASE Right 06/12/2016   Procedure: RIGHT INDEX FINGER TRIGGER RELEASE;  Surgeon: [name], MD;  Location: [facility];  Service: Orthopedics;  Laterality: Right;  . TRIGGER FINGER RELEASE Right 01/19/2018   Procedure: RIGHT RING FINGER TRIGGER FINGER RELEASE;  Surgeon: [name], MD;  Location: [facility];  Service: Orthopedics;  Laterality: Right;     There were no vitals filed for this visit.  Subjective Assessment - 10/30/18 0847    Subjective  After I left here yesterday the back, neck and hand pain hit me when I got to the first stop light. I went home and rested and massaged myself. I ordered the theracane.    Pertinent History  neck surgey x 3.    5 back surgeries.  SOB  last time withou pain was 2017   Also bilateral knee and hip pain    Currently in Pain?  Yes    Pain Score  9     Pain Location  Back    Multiple Pain Sites  Yes    Pain Score  9    Pain Location  Neck    Pain Radiating Towards  to shoulders and hands                       [facility] Adult PT Treatment/Exercise - 10/30/18 0001      Neck Exercises: Seated   Postural Training  Seated shoulder rolls  and scapular squeezes     Other Seated Exercise  Levator stretch AROM     Other Seated Exercise  Seated Cervical AROM  x 5 each way       Lumbar Exercises: Stretches   Standing Side Bend Limitations  standing side bending x 3 each way, increased pain       Lumbar Exercises: Supine   Pelvic Tilt  10 reps    Clam  10 reps   bialteral and alternating   Clam Limitations  cues for abdominal draw in     Bent Knee Raise  10 reps    Bent Knee Raise Limitations  cues for abdominal draw in       Shoulder Exercises: Supine   Other Supine Exercises  Supine press ups and pullovers -cues to keep comfortable ROM       Shoulder Exercises: Pulleys   Flexion  2 minutes               PT Short Term Goals - 10/15/18 0744      PT SHORT TERM GOAL #1   Title  Pt will be I with HEP for strength and ROM  neck /back/finger    Time  3    Period  Weeks    Status  New      PT SHORT TERM GOAL #2   Title  She will report neck pain decreased 25% or more    Time  4    Period  Weeks    Status  New  PT SHORT TERM GOAL #3   Title  she will report back pain decreased   25% or more    Time  4    Period  Weeks    Status  New      PT SHORT TERM GOAL  #4   Title  Her finger will improve  extension by  degrees    Time  4    Period  Weeks    Status  New        PT Long Term Goals - 10/15/18 0746      PT LONG TERM GOAL #1   Title  Pt will be I with more advanced HEP for LEs , back and neck    Time  8    Period  Weeks    Status  New      PT LONG TERM GOAL #2   Title  She will report able to  sit for 45-60 min  due to decreaed back pain    Time  8    Period  Weeks    Status  New      PT LONG TERM GOAL #3   Title  She will be able to home tasks perform more    due to decreased  neck and back   pain    Time  8    Period  Weeks    Status  New      PT LONG TERM GOAL #4   Title  She will report pain decreased 50% or more  in back and neck so she is able to walk out of home for 15-30 min with min incr pain    Time  8    Period  Weeks    Status  New      PT LONG TERM GOAL #5   Title  FOTO score will improve to  70 % limited related to neck    Time  8    Period  Weeks    Status  New            Plan - 10/30/18 7858    Clinical Impression Statement  Pt reports increased pain after last session. Time spent progressing HEP and updating. She did order a theracane and she still plans to bring in her TENS for assistance. She deomstrates pain behaviors and guarding with all therex today. She declined any modalities or manual due to increased pain after last session.    PT Next Visit Plan  HEP , modalities and manual for ROM and pain as toelrated  , review HEP    PT Home Exercise Plan  PPT, LTR, shoulder rolls, thumb ROM, supine march, bent knee fall outs, neck rom       Patient will benefit from skilled therapeutic intervention in order to improve the following deficits and impairments:  Difficulty walking, Obesity, Pain, Postural dysfunction, Decreased strength, Decreased activity tolerance, Increased muscle spasms  Visit Diagnosis: Muscle weakness (generalized)  Cervicalgia  Chronic bilateral low back pain, unspecified  whether sciatica present  Abnormal posture     Problem List Patient Active Problem List   Diagnosis Date Noted  . Chest pain 09/24/2018  . Tobacco abuse 08/21/2018  . Right groin pain 07/20/2018  . Depressed mood 07/13/2018  . Mouth pain 06/02/2018  . Paroxysmal SVT (supraventricular tachycardia) ([place]) 03/20/2018  . Pure hypercholesterolemia 03/20/2018  . Muscle cramping 02/25/2018  . Chest pressure 02/20/2018  . Nodule of upper lobe of right lung 02/13/2018  .  Falls, subsequent encounter 01/30/2018  . Open wound of left foot 11/04/2017  . Essential hypertension 10/27/2017  . Solitary pulmonary nodule 10/07/2017  . Acute on chronic right-sided congestive heart failure ([place])   . Restrictive lung disease secondary to obesity 10/01/2017  . Polycythemia, secondary 10/01/2017  . Chronic viral hepatitis B without delta-agent ([place]) 07/08/2017  . COPD (chronic obstructive pulmonary disease) with chronic bronchitis ([place]) 06/27/2017  . Chronic kidney disease (CKD), stage IV (severe) ([place]) 01/14/2017  . Type 2 diabetes mellitus with stage 3 chronic kidney disease, with long-term current use of insulin ([place]) 12/12/2016  . Urge incontinence of urine 12/05/2016  . Trigger index finger of left hand 07/15/2016  . Back pain 04/07/2014  . Morbid obesity ([place]) 10/02/2012  . Chronic diastolic CHF (congestive heart failure) ([place]) 04/07/2012  . GERD 01/26/2010  . Hypertension 08/04/2008  . Obstructive sleep apnea treated with BiPAP 02/03/2008  . FIBROCYSTIC BREAST DISEASE 10/28/2006  . Hyperlipidemia 03/27/2006  . Obesity hypoventilation syndrome ([place]) 03/27/2006  . Former tobacco use 03/27/2006  . Depression with anxiety 03/27/2006    Dorene Ar, PTA 10/30/2018, 9:26 AM  [facility] [address] [place], [place], [address] Phone: [phone]   Fax:  [phone]  Name: [name] MRN: [id number] Date of Birth:  11/10/1965

## 2018-10-30 NOTE — Progress Notes (Addendum)
Complaint:  Visit Type: Patient returns to my office for continued preventative foot care services. Complaint: Patient states" my nails have grown long and thick and become painful to walk and wear shoes" Patient has been diagnosed with DM with angiopathy. The patient presents for preventative foot care services. No changes to ROS.   Podiatric Exam: Vascular: dorsalis pedis and posterior tibial pulses are palpable bilateral. Capillary return is immediate. Temperature gradient is WNL. Skin turgor WNL  Sensorium: Normal Semmes Weinstein monofilament test. Normal tactile sensation bilaterally. Nail Exam: Pt has thick disfigured discolored nails with subungual debris noted bilateral entire nail hallux through fifth toenails Ulcer Exam: There is no evidence of ulcer or pre-ulcerative changes or infection. Orthopedic Exam: Muscle tone and strength are WNL. No limitations in general ROM. No crepitus or effusions noted. Foot type and digits show no abnormalities. HAV left foot. Skin: No Porokeratosis. No infection or ulcers.  Thick hyperkeratotic skin right foot.  Diagnosis:  Onychomycosis, , Pain in right toe, pain in left toes  Treatment & Plan Procedures and Treatment: Consent by patient was obtained for treatment procedures.   Debridement of mycotic and hypertrophic toenails, 1 through 5 bilateral and clearing of subungual debris. No ulceration, no infection noted.  Return Visit-Office Procedure: Patient instructed to return to the office for a follow up visit 10 weeks  for continued evaluation and treatment.    Gardiner Barefoot DPM

## 2018-11-02 ENCOUNTER — Ambulatory Visit: Payer: Medicare Other | Admitting: Physical Therapy

## 2018-11-02 DIAGNOSIS — Z79899 Other long term (current) drug therapy: Secondary | ICD-10-CM | POA: Diagnosis not present

## 2018-11-02 DIAGNOSIS — M5136 Other intervertebral disc degeneration, lumbar region: Secondary | ICD-10-CM | POA: Diagnosis not present

## 2018-11-02 DIAGNOSIS — G894 Chronic pain syndrome: Secondary | ICD-10-CM | POA: Diagnosis not present

## 2018-11-02 DIAGNOSIS — M5030 Other cervical disc degeneration, unspecified cervical region: Secondary | ICD-10-CM | POA: Diagnosis not present

## 2018-11-03 ENCOUNTER — Other Ambulatory Visit: Payer: Self-pay

## 2018-11-03 ENCOUNTER — Encounter: Payer: Self-pay | Admitting: Family Medicine

## 2018-11-03 ENCOUNTER — Other Ambulatory Visit: Payer: Self-pay | Admitting: Podiatry

## 2018-11-03 ENCOUNTER — Ambulatory Visit (INDEPENDENT_AMBULATORY_CARE_PROVIDER_SITE_OTHER): Payer: Medicare Other | Admitting: Family Medicine

## 2018-11-03 VITALS — BP 140/78 | HR 91 | Wt 378.0 lb

## 2018-11-03 DIAGNOSIS — E1122 Type 2 diabetes mellitus with diabetic chronic kidney disease: Secondary | ICD-10-CM | POA: Diagnosis not present

## 2018-11-03 DIAGNOSIS — B353 Tinea pedis: Secondary | ICD-10-CM

## 2018-11-03 DIAGNOSIS — E1151 Type 2 diabetes mellitus with diabetic peripheral angiopathy without gangrene: Secondary | ICD-10-CM | POA: Diagnosis not present

## 2018-11-03 DIAGNOSIS — Z794 Long term (current) use of insulin: Secondary | ICD-10-CM | POA: Diagnosis not present

## 2018-11-03 DIAGNOSIS — N183 Chronic kidney disease, stage 3 unspecified: Secondary | ICD-10-CM | POA: Diagnosis not present

## 2018-11-03 LAB — POCT GLYCOSYLATED HEMOGLOBIN (HGB A1C): HbA1c, POC (controlled diabetic range): 8.2 % — AB (ref 0.0–7.0)

## 2018-11-03 NOTE — Patient Instructions (Addendum)
It was wonderful to see you today!  Your A1c today was 8.2 down from 9.4 at your last check.  This is wonderful I want you to keep up the good work including is to continue changes.  I am considering starting you on a new medication called Wilder Glade but I need to check with her pharmacist to make sure it is okay given your kidney issues.  We will see you back in 3 months for a follow-up diabetes check.  Regarding your weight loss I would like you to continue up with the good work and losing weight.  I also want you to check with the bariatric surgery team to determine what weight they need you have before they will perform the surgery.  I am also going to schedule you for an appointment next month because it is time for your Pap smear.  If you have any questions or concerns please feel free to reach out on MyChart.  Have a wonderful day!

## 2018-11-03 NOTE — Assessment & Plan Note (Addendum)
Patient's A1c today is 8.2 from 9.4 at last check.  She is continuing to take her Humalog.  Discussed with her starting Farxiga. - I will send the chart to Dr. Valentina Lucks for recommendations on starting Farxiga -Continue home insulin regiment -We will follow-up in 3 months for A1c check

## 2018-11-03 NOTE — Assessment & Plan Note (Signed)
Discussed weight loss plan with patient she reports she is still swimming and has changed her diet. -Recommend continuing dietary changes and exercise plan -Recommended contacting bariatric surgery to establish how much weight she needs to lose to be eligible for bariatric surgery

## 2018-11-03 NOTE — Progress Notes (Signed)
    Subjective:  [name] is a 53 y.o. female who presents to the [facility] today for a follow-up visit concerning her diabetes as well as hospitalization in August.  HPI: Patient is doing well since her hospitalization.  She is reports that she has not had chest pain in over a month.  She says that she still has shortness of breath with exertion and that she has issues getting out of the house because she cannot carry her O2 tank.  She says she has been evaluated for a electric wheelchair but is yet to be successful receiving one.   We discussed patient's diabetes.  Her A1c today is 8.2 from 9.4 at last visit.  She has been making dietary changes as well as continuing her exercise regimen.  She reports that she was at one point 400 pounds and is now down to 378 pounds.  She is pleased with this weight loss and wants to continue.  She says that she is already been through bariatric surgery counseling and they told her she needs to lose weight.  She says that she contacted them as yet to receive a call back about how much weight she actually needs to lose.  Patient is otherwise doing well denies any chest pain, unusual shortness of breath, edema in lower extremities, recent illnesses. Objective:  Physical Exam: BP 140/78   Pulse 91   Wt (!) 171.5 kg   LMP 04/10/2006   SpO2 91%   BMI 54.24 kg/m   Gen: NAD, resting comfortably CV: RRR with no murmurs appreciated Pulm: NWOB, CTAB with no crackles, wheezes, or rhonchi GI: Normal bowel sounds present. Soft, Nontender, Nondistended. MSK: no edema, cyanosis, or clubbing noted Skin: warm, dry Neuro: grossly normal, moves all extremities Psych: Normal affect and thought content  Results for orders placed or performed in visit on 11/03/18 (from the past 72 hour(s))  HgB A1c     Status: Abnormal   Collection Time: 11/03/18  2:25 PM  Result Value Ref Range   Hemoglobin A1C     HbA1c POC (<> result, manual entry)     HbA1c, POC (prediabetic  range)     HbA1c, POC (controlled diabetic range) 8.2 (A) 0.0 - 7.0 %   *Note: Due to a large number of results and/or encounters for the requested time period, some results have not been displayed. A complete set of results can be found in Results Review.     Assessment/Plan:  Morbid obesity ([place]) Discussed weight loss plan with patient she reports she is still swimming and has changed her diet. -Recommend continuing dietary changes and exercise plan -Recommended contacting bariatric surgery to establish how much weight she needs to lose to be eligible for bariatric surgery  Type 2 diabetes mellitus with stage 3 chronic kidney disease, with long-term current use of insulin ([facility]CC) Patient's A1c today is 8.2 from 9.4 at last check.  She is continuing to take her Humalog.  Discussed with her starting Farxiga. - I will send the chart to Dr. Valentina Lucks for recommendations on starting Farxiga -Continue home insulin regiment -We will follow-up in 3 months for A1c check    Lab Orders     HgB A1c  [name], MD  PGY-1, [facility]  11/03/18 3:55 PM

## 2018-11-04 ENCOUNTER — Ambulatory Visit: Payer: Self-pay

## 2018-11-04 ENCOUNTER — Ambulatory Visit (INDEPENDENT_AMBULATORY_CARE_PROVIDER_SITE_OTHER): Payer: Medicare Other | Admitting: Specialist

## 2018-11-04 ENCOUNTER — Encounter: Payer: Self-pay | Admitting: Specialist

## 2018-11-04 ENCOUNTER — Encounter: Payer: Self-pay | Admitting: Physical Therapy

## 2018-11-04 ENCOUNTER — Ambulatory Visit: Payer: Medicare Other | Admitting: Physical Therapy

## 2018-11-04 VITALS — BP 133/83 | HR 76 | Ht 70.0 in | Wt 378.0 lb

## 2018-11-04 DIAGNOSIS — G4733 Obstructive sleep apnea (adult) (pediatric): Secondary | ICD-10-CM | POA: Diagnosis not present

## 2018-11-04 DIAGNOSIS — M542 Cervicalgia: Secondary | ICD-10-CM

## 2018-11-04 DIAGNOSIS — M5412 Radiculopathy, cervical region: Secondary | ICD-10-CM | POA: Diagnosis not present

## 2018-11-04 DIAGNOSIS — M545 Low back pain, unspecified: Secondary | ICD-10-CM

## 2018-11-04 DIAGNOSIS — M25512 Pain in left shoulder: Secondary | ICD-10-CM | POA: Diagnosis not present

## 2018-11-04 DIAGNOSIS — R200 Anesthesia of skin: Secondary | ICD-10-CM

## 2018-11-04 DIAGNOSIS — M6281 Muscle weakness (generalized): Secondary | ICD-10-CM

## 2018-11-04 DIAGNOSIS — J9621 Acute and chronic respiratory failure with hypoxia: Secondary | ICD-10-CM | POA: Diagnosis not present

## 2018-11-04 DIAGNOSIS — G8929 Other chronic pain: Secondary | ICD-10-CM

## 2018-11-04 DIAGNOSIS — M5481 Occipital neuralgia: Secondary | ICD-10-CM | POA: Diagnosis not present

## 2018-11-04 DIAGNOSIS — Z981 Arthrodesis status: Secondary | ICD-10-CM

## 2018-11-04 DIAGNOSIS — M4726 Other spondylosis with radiculopathy, lumbar region: Secondary | ICD-10-CM

## 2018-11-04 DIAGNOSIS — M4322 Fusion of spine, cervical region: Secondary | ICD-10-CM

## 2018-11-04 DIAGNOSIS — R202 Paresthesia of skin: Secondary | ICD-10-CM

## 2018-11-04 DIAGNOSIS — M7542 Impingement syndrome of left shoulder: Secondary | ICD-10-CM

## 2018-11-04 DIAGNOSIS — M7541 Impingement syndrome of right shoulder: Secondary | ICD-10-CM

## 2018-11-04 DIAGNOSIS — R293 Abnormal posture: Secondary | ICD-10-CM | POA: Diagnosis not present

## 2018-11-04 DIAGNOSIS — M25511 Pain in right shoulder: Secondary | ICD-10-CM

## 2018-11-04 DIAGNOSIS — M5090 Cervical disc disorder, unspecified, unspecified cervical region: Secondary | ICD-10-CM | POA: Diagnosis not present

## 2018-11-04 DIAGNOSIS — M48061 Spinal stenosis, lumbar region without neurogenic claudication: Secondary | ICD-10-CM | POA: Diagnosis not present

## 2018-11-04 NOTE — Patient Instructions (Addendum)
Avoid overhead lifting and overhead use of the arms. Do not lift greater than 10 lbs. Tylenol ES one every 6-8 hours for pain and inflamation.  Continue with PT for another 2-3 weeks, then a home exercise program. Obtain MRI right shoulder. Neurology evaluation due to findings of probable cervical radiculopathy ? Thoracic outlet, vs other cause of  C8, T1 radiculopathy, MRI negative for compression at the cervical level.Elevated uric acid level indicates that some of the joint pain may be due to inflamation due to hyperuricemia or gout. Recommend antigout meds including colchicine and allopurinol and assessment of renal function. Patients with increased uric acid levels may have this due to poor kidney secretion of the uric acid into the urine due to  Decrease kidney function or due to increased levels of uric acid due to a hypermetablolic state that can be seen with occult tumor or increased protein intake. A thorough medical evaluation is recommended.

## 2018-11-04 NOTE — Progress Notes (Signed)
Office Visit Note   Patient: [name]           Date of Birth: Oct 16, 1965           MRN: [id number] Visit Date: 11/04/2018              Requested by: [name], MD [address],  [place] [address] PCP: [name], MD   Assessment & Plan: Visit Diagnoses:  1. Bilateral occipital neuralgia   2. Bilateral arm numbness and tingling while sleeping   3. Cervical radiculopathy, chronic   4. Other spondylosis with radiculopathy, lumbar region   5. S/P lumbar fusion   6. Fusion of spine of cervical region   7. Chronic pain of both shoulders   8. Impingement syndrome of right shoulder   9. Impingement syndrome of left shoulder     Plan: Avoid overhead lifting and overhead use of the arms. Do not lift greater than 10 lbs. Tylenol ES one every 6-8 hours for pain and inflamation.  Continue with PT for another 2-3 weeks, then a home exercise program. Obtain MRI right shoulder. Neurology evaluation due to findings of probable cervical radiculopathy ? Thoracic outlet, vs other cause of  C8, T1 radiculopathy, MRI negative for compression at the cervical level.Elevated uric acid level indicates that some of the joint pain may be due to inflamation due to hyperuricemia or gout. Recommend antigout meds including colchicine and allopurinol and assessment of renal function. Patients with increased uric acid levels may have this due to poor kidney secretion of the uric acid into the urine due to  Decrease kidney function or due to increased levels of uric acid due to a hypermetablolic state that can be seen with occult tumor or increased protein intake. A thorough medical evaluation is recommended.   Continue with PT for another 2-3 weeks, then a home exercise program. Obtain MRI right shoulder. Neurology evaluation due to findings of probable cervical radiculopathy ? Thoracic outlet, vs other cause of  C8, T1 radiculopathy, MRI negative for compression at the cervical  level. Vitamin B complex, creel oil .  Follow-Up Instructions: Return in about 3 weeks (around 11/25/2018).   Orders:  Orders Placed This Encounter  Procedures  . MR SHOULDER RIGHT WO CONTRAST  . XR Shoulder Right  . XR Shoulder Left  . Ambulatory referral to Neurology   No orders of the defined types were placed in this encounter.     Procedures: No procedures performed   Clinical Data: Findings:  1. Paresthesia of skin (R20.2)    EMG & NCV Findings: Evaluation of the left median (across palm) sensory nerve showed no response (Palm).  The right median (across palm) sensory nerve showed no response (Palm) and prolonged distal peak latency (3.8 ms).  All remaining nerves (as indicated in the following tables) were within normal limits.  All left vs. right side differences were within normal limits.    Needle evaluation of the right abductor pollicis brevis muscle showed increased insertional activity.  The right first dorsal interosseous and the left abductor pollicis brevis muscles showed increased insertional activity and diminished recruitment.  The left first dorsal interosseous muscle showed increased insertional activity, widespread spontaneous activity, increased motor unit amplitude, and diminished recruitment.  All remaining muscles (as indicated in the following table) showed no evidence of electrical instability.    Impression: The above electrodiagnostic study is ABNORMAL and reveals evidence of moderate subacute on chronic C8 and T1 radiculopathy on the left >  right.  *Nerve conductions do not show any evidence of a demyelinating polyneuropathy but I cannot totally rule out the needle EMG findings being a axonal neuropathy which could still be related to diabetes or some other nerve insult.  There is some evidence of very mild right median nerve entrapment at the wrist.    There is no significant electrodiagnostic evidence of any other focal nerve entrapment,  brachial plexopathy or generalized peripheral neuropathy.   Recommendations: 1.  Follow-up with referring physician. 2.  Continue current management of symptoms.    CLINICAL DATA:  Follow-up indeterminate right lung nodule.  EXAM: CT CHEST WITHOUT CONTRAST  TECHNIQUE: Multidetector CT imaging of the chest was performed following the standard protocol without IV contrast.  COMPARISON:  CT on 04/21/2018 and 10/01/2017  FINDINGS: Cardiovascular:  No acute findings. Coronary artery atherosclerosis.  Mediastinum/Nodes: No masses or pathologically enlarged lymph nodes identified on this unenhanced exam.  Lungs/Pleura: Sub-solid nodule is seen in the lateral right upper lobe which measures 1.2 x 0.7 cm. This remains stable since 04/21/2018, but shows slight increase in size compared to earlier exam of 10/01/2017. This remains suspicious for low-grade adenocarcinoma. No other suspicious pulmonary nodules or masses are identified. No evidence of pulmonary infiltrate or pleural effusion.  Upper Abdomen:  Unremarkable.  Musculoskeletal:  No suspicious bone lesions.  IMPRESSION: 12 mm sub-solid nodule in right upper lobe is stable since 04/21/2018 exam, although it does show a slight increase in size compared to 2019 exam. This remains suspicious for low-grade adenocarcinoma. Recommend continued follow-up by chest CT in 1-2 years. This recommendation follows the consensus statement: Guidelines for Management of Small Pulmonary Nodules Detected on CT Images: From the Fleischner Society 2017; Radiology 2017; 284:228-243.   Electronically Signed   By: Marlaine Hind M.D.   On: 08/07/2018 16:18 CLINICAL DATA:  Lumbar spinal stenosis. Right greater than left low back and leg pain. Prior lumbar fusion.  EXAM: LUMBAR MYELOGRAM  FLUOROSCOPY TIME:  Fluoroscopy Time: 31 seconds  Radiation Exposure Index: 666.31 microGray*m^2  PROCEDURE: After thorough discussion  of risks and benefits of the procedure including bleeding, infection, injury to nerves, blood vessels, adjacent structures as well as headache and CSF leak, written and oral informed consent was obtained. Consent was obtained by Dr. Logan Bores. Time out form was completed.  Patient was positioned prone on the fluoroscopy table. Local anesthesia was provided with 1% lidocaine without epinephrine after prepped and draped in the usual sterile fashion. Puncture was performed at L4-5 using a 6 inch 22-gauge spinal needle via a midline/left paramedian approach. Using a single pass through the dura, the needle was placed within the thecal sac, with return of clear CSF. 15 mL of Isovue M-200 was injected into the thecal sac, with normal opacification of the nerve roots and cauda equina consistent with free flow within the subarachnoid space.  I personally performed the lumbar puncture and administered the intrathecal contrast. I also personally supervised acquisition of the myelogram images.  TECHNIQUE: Contiguous axial images were obtained through the Lumbar spine after the intrathecal infusion of infusion. Coronal and sagittal reconstructions were obtained of the axial image sets.  COMPARISON:  07/14/2017  FINDINGS: LUMBAR MYELOGRAM FINDINGS:  Transitional lumbosacral anatomy is again noted with L5 being largely sacralized. There is trace retrolisthesis of T12 on L1 in the upright neutral and extension positions which reduces with flexion. There is evidence of stenosis at L1-2 in the upright position, however detailed characterization is limited by patient body habitus and  paucity of intrathecal contrast at and above this level. Sequelae of L2-L5 fusion are identified with wide patency of the spinal canal at these levels.  CT LUMBAR MYELOGRAM FINDINGS:  Trace retrolisthesis of T12 on L1 is unchanged from the prior study. Sequelae of L2-L5 fusion are again identified with  solid interbody and posterior element arthrodesis at each level. Pedicle screws are in place bilaterally from L2-L5 and appear well-positioned without evidence of loosening. No fracture or suspicious osseous lesion is identified. The conus medullaris terminates at the lower L1 level. The nerve roots of the cauda equina are unremarkable. Aortic atherosclerosis and retroperitoneal surgical clips are noted.  T10-11: Mild disc space narrowing. Mild disc bulging and congenitally short pedicles result in borderline to mild spinal stenosis without significant neural foraminal stenosis.  T11-12: Mild disc space narrowing. Disc bulging, endplate spurring, congenitally short pedicles, and moderate facet and ligamentum flavum hypertrophy result in mild spinal stenosis and mild to moderate left greater than right neural foraminal stenosis, unchanged.  T12-L1: Vacuum disc and mild posterior disc space narrowing. Disc bulging, congenitally short pedicles, and moderate facet and ligamentum flavum hypertrophy result in mild spinal stenosis and moderate right and mild left neural foraminal stenosis, unchanged.  L1-2: Disc bulging, congenitally short pedicles, and severe facet and ligamentum flavum hypertrophy result in moderate to severe spinal stenosis and mild right and mild-to-moderate left neural foraminal stenosis, unchanged.  L2-3: Previous fusion. Widely patent spinal canal and left neural foramen. Unchanged mild osseous neural foraminal narrowing on the right.  L3-4: Previous wide posterior decompression and fusion. Widely patent spinal canal. Unchanged mild osseous neural foraminal narrowing bilaterally.  L4-5: Previous wide posterior decompression and fusion. Widely patent spinal canal. Unchanged mild osseous neural foraminal narrowing bilaterally.  L5-S1: Rudimentary disc.  No stenosis.  IMPRESSION: 1. 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 2. Unchanged mild spinal stenosis and mild-to-moderate neural foraminal stenosis at T11-12 and T12-L1. 3. Solid L2-L5 fusion.   Electronically Signed   By: Logan Bores M.D.   On: 09/01/2018 16:06     Subjective: Chief Complaint  Patient presents with  . Neck - Follow-up    53 year old right handed female with history of previous cervical spine surgery x 3 by Dr. [name]. The last surgery was in [place] in 2012. She had a anterior cervical spine surgery the second procedure and a posterior procedure the last time. He performed a posterior cervical fusion with rods and screws. She saw Dr. [name] last in 2012. At that time she was doing well. Now she has neck pain "yes Lord". It is a "9" of ten in the cervical occipital junction, she is using A TENS unit to try and relieve the pain. She has not had injection, and she can not do steroids due to the elevated  Glucose post injection for 1 1/2 months post injection. No botox trial as yet. She has an overactive bladder. Goes to  Urologist, Dr. Harrell Gave [facility]. She is to see him next week. She has difficulty with neck pain, the arm pain is terrible, With pain in the shoulders more than the hands but radiates from the shoulders into the hands. She experiences numbness in the ring and pinky fingers right arm greater than left. "My whole right side is messed up" The Index finger and thumb on the left are painful to movement and swell, the right thumb is swollen with catching of the right thumb  With flexion. Dr. Ernestina Patches did EMG/NCVs in the past          Review of Systems  Constitutional: Positive for unexpected weight change. Negative for activity change, appetite change, chills, diaphoresis, fatigue and fever.  HENT: Negative.  Negative for congestion, dental problem, drooling, ear discharge, ear pain, facial swelling, hearing loss, mouth sores, nosebleeds, postnasal drip,  rhinorrhea, sinus pressure, sinus pain, sneezing, sore throat, tinnitus, trouble swallowing and voice change.   Eyes: Negative.  Negative for photophobia, pain, discharge, redness, itching and visual disturbance.  Respiratory: Positive for apnea. Negative for cough, choking, chest tightness, shortness of breath, wheezing and stridor.   Cardiovascular: Negative for chest pain, palpitations and leg swelling.  Gastrointestinal: Negative.  Negative for abdominal distention, abdominal pain, anal bleeding, blood in stool, constipation, diarrhea, nausea, rectal pain and vomiting.  Endocrine: Negative.   Genitourinary: Negative.  Negative for difficulty urinating, dyspareunia, dysuria, enuresis, flank pain, genital sores, hematuria, menstrual problem, urgency and vaginal pain.  Musculoskeletal: Positive for back pain and gait problem. Negative for arthralgias, joint swelling, myalgias, neck pain and neck stiffness.  Skin: Negative.  Negative for color change, pallor, rash and wound.  Allergic/Immunologic: Positive for environmental allergies. Negative for food allergies and immunocompromised state.  Neurological: Positive for weakness, light-headedness, numbness and headaches.  Hematological: Negative.  Negative for adenopathy. Does not bruise/bleed easily.  Psychiatric/Behavioral: Negative.  Negative for agitation, behavioral problems, confusion, decreased concentration, dysphoric mood, hallucinations, self-injury, sleep disturbance and suicidal ideas. The patient is not nervous/anxious and is not hyperactive.      Objective: Vital Signs: BP 133/83 (BP Location: Left Arm, Patient Position: Sitting)   Pulse 76   Ht 5\' 10"  (1.778 m)   Wt (!) 378 lb (171.5 kg)   LMP 04/10/2006   BMI 54.24 kg/m   Physical Exam Constitutional:      Appearance: She is well-developed.  HENT:     Head: Normocephalic and atraumatic.  Eyes:     Pupils: Pupils are equal, round, and reactive to light.  Neck:      Musculoskeletal: Normal range of motion and neck supple.  Pulmonary:     Effort: Pulmonary effort is normal.     Breath sounds: Normal breath sounds.  Abdominal:     General: Bowel sounds are normal.     Palpations: Abdomen is soft.  Skin:    General: Skin is warm and dry.  Neurological:     Mental Status: She is alert and oriented to person, place, and time.  Psychiatric:        Behavior: Behavior normal.        Thought Content: Thought content normal.        Judgment: Judgment normal.     Back Exam   Tenderness  The patient is experiencing tenderness in the cervical.  Range of Motion  Extension:  40 abnormal  Flexion:  40 abnormal  Lateral bend right:  40 abnormal  Lateral bend left:  40 abnormal  Rotation right:  40 abnormal  Rotation left:  40 abnormal   Muscle Strength  Right Quadriceps:  5/5  Left Quadriceps:  5/5  Right Hamstrings:  5/5  Left Hamstrings:  5/5   Tests  Straight leg raise right: negative Straight leg raise left: negative  Other  Toe walk: normal Heel walk: normal Sensation: decreased Gait: abnormal       Specialty Comments:  No specialty comments available.  Imaging: Xr Shoulder Left  Result Date: 11/04/2018 Leftt shoulder with mild anterior  acromion spur with Type 2 acromion, no glenohumeral arthrosis but sclerosis at the insertion site of the left supraspinatous and a spur off the lateral aspect of the left greater tuberosity. No significant SAS narrowing, mild OA of the Distal AC joint.  Xr Shoulder Right  Result Date: 11/04/2018 Right shoulder with mild anterior acromion spur with Type 2 acromion, no glenohumeral arthrosis but sclerosis at the insertion site of the right supraspinatous and a spur off the lateral aspect of the right greater tuberosity. No significant SAS narrowing, mild OA of the Distal AC joint.    PMFS History: Patient Active Problem List   Diagnosis Date Noted  . Chest pain 09/24/2018  . Tobacco abuse  08/21/2018  . Right groin pain 07/20/2018  . Depressed mood 07/13/2018  . Mouth pain 06/02/2018  . Paroxysmal SVT (supraventricular tachycardia) ([employer]) 03/20/2018  . Pure hypercholesterolemia 03/20/2018  . Muscle cramping 02/25/2018  . Chest pressure 02/20/2018  . Nodule of upper lobe of right lung 02/13/2018  . Falls, subsequent encounter 01/30/2018  . Open wound of left foot 11/04/2017  . Essential hypertension 10/27/2017  . Solitary pulmonary nodule 10/07/2017  . Acute on chronic right-sided congestive heart failure ([place])   . Restrictive lung disease secondary to obesity 10/01/2017  . Polycythemia, secondary 10/01/2017  . Chronic viral hepatitis B without delta-agent ([place]) 07/08/2017  . COPD (chronic obstructive pulmonary disease) with chronic bronchitis ([place]) 06/27/2017  . Chronic kidney disease (CKD), stage IV (severe) ([place]) 01/14/2017  . Type 2 diabetes mellitus with stage 3 chronic kidney disease, with long-term current use of insulin ([place]) 12/12/2016  . Urge incontinence of urine 12/05/2016  . Trigger index finger of left hand 07/15/2016  . Back pain 04/07/2014  . Morbid obesity ([place]) 10/02/2012  . Chronic diastolic CHF (congestive heart failure) ([place]) 04/07/2012  . GERD 01/26/2010  . Hypertension 08/04/2008  . Obstructive sleep apnea treated with BiPAP 02/03/2008  . FIBROCYSTIC BREAST DISEASE 10/28/2006  . Hyperlipidemia 03/27/2006  . Obesity hypoventilation syndrome ([place]) 03/27/2006  . Former tobacco use 03/27/2006  . Depression with anxiety 03/27/2006   Past Medical History:  Diagnosis Date  . Anxiety   . Arthritis 04-10-11   hips, shoulders, back  . Asthma   . Bipolar disorder ([place])   . Cancer ([place]) 1993   cervical, no treatment done, went away per pt  . Cervical dysplasia or atypia 04-10-11   '93- once dx.-got pregnant-no intervention, then postpartum, no dysplasia found  . CHF (congestive heart failure) ([place])    no cardiologist 2014 dx, none now  . Chronic  kidney disease    stage 3 kidney disease  . 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 Pathology showed Condyloma, gluteal CONDYLOMA ACUMINATUM.   [name] Kitchen COPD (chronic obstructive pulmonary disease) ([place])   . Diabetes mellitus without complication ([place])   . Dyspnea    with actity, sitting  . Fall 12/16/2016  . Fibrocystic breast disease   . GERD (gastroesophageal reflux disease)   . Hepatitis    hep B-count is low at present,doesn't register  . Hypercalcemia   . Hyperlipidemia   . Hypertension   . Lung nodule 11/2017   right and left lung  . Migraine headache    none recent  . Morbid obesity ([place]) 03/27/2006  . Neuropathy   . Pneumonia    walking pneu 15 yrs. ago  . Skin lesion 03/15/2011   In gluteal crease now s/p removal by Dr. [name] of General Surgery on  3/12. Path shows condyloma.     . Sleep apnea 04-10-11   uses cpap, pt does not know settings  . Sleep apnea   . Trigger finger    left third    Family History  Problem Relation Age of Onset  . Pulmonary embolism Mother   . Stroke Father   . Alcohol abuse Father   . Pneumonia Father   . Cancer Maternal Aunt        breast  . Breast cancer Maternal Aunt   . Asthma Other   . COPD Other   . Hypertension Other   . Diabetes Other   . Asthma Sister   . Depression Sister   . Arthritis Sister   . Sickle cell anemia Son     Past Surgical History:  Procedure Laterality Date  . ANAL FISTULECTOMY  04/17/2011   Procedure: FISTULECTOMY ANAL;  Surgeon: Imogene Burn. [name], MD;  Location: [facility];  Service: General;  Laterality: N/A;  Excision of Condyloma Gluteal Cleft   . BACK SURGERY  04-10-11   x5-Lumbar fusion-retained hardware.(Dr. Louanne Skye)  . BREAST CYST EXCISION     bilateral breast, 3 cysts removed from each breast  . BREAST EXCISIONAL BIOPSY Right    x 3  . BREAST EXCISIONAL BIOPSY Left    x 3  . CARPAL TUNNEL RELEASE Bilateral   . Cervical biospy    . COLONOSCOPY WITH PROPOFOL N/A 07/06/2015    Procedure: COLONOSCOPY WITH PROPOFOL;  Surgeon: [name], MD;  Location: [facility];  Service: Endoscopy;  Laterality: N/A;  . DOPPLER ECHOCARDIOGRAPHY  04/06/2012   AT [place] 55-60%  . FRACTURE SURGERY    . I&D EXTREMITY Left 12/18/2016   Procedure: IRRIGATION AND DEBRIDEMENT LEFT FOOT, CLOSURE;  Surgeon: [name], MD;  Location: [facility];  Service: Orthopedics;  Laterality: Left;  [name] Kitchen MULTIPLE TOOTH EXTRACTIONS    . NECK SURGERY  04-10-11   x3- cervical fusion with plating and screws-Dr. [name]  . TEE WITHOUT CARDIOVERSION N/A 04/06/2012   Procedure: TRANSESOPHAGEAL ECHOCARDIOGRAM (TEE);  Surgeon: [name], MD;  [facility];  Service: Cardiovascular;  Laterality: N/A;  . TRIGGER FINGER RELEASE Right    middle finger  . TRIGGER FINGER RELEASE Left 06/28/2016   Procedure: RELEASE TRIGGER FINGER LEFT 3RD FINGER;  Surgeon: [name], MD;  Location: [facility];  Service: Orthopedics;  Laterality: Left;  . TRIGGER FINGER RELEASE Right 06/12/2016   Procedure: RIGHT INDEX FINGER TRIGGER RELEASE;  Surgeon: [name], MD;  Location: [facility];  Service: Orthopedics;  Laterality: Right;  . TRIGGER FINGER RELEASE Right 01/19/2018   Procedure: RIGHT RING FINGER TRIGGER FINGER RELEASE;  Surgeon: [name], MD;  Location: [facility];  Service: Orthopedics;  Laterality: Right;   Social History   Occupational History    Comment: Disabled  Tobacco Use  . Smoking status: Former Smoker    Packs/day: 0.25    Years: 40.00    Pack years: 10.00    Types: Cigarettes    Start date: 01/28/1977    Quit date: 10/19/2018    Years since quitting: 0.0  . Smokeless tobacco: Never Used  . Tobacco comment: Quit prior to hospitalization using low dose Chantix  Substance and Sexual Activity  . Alcohol use: No    Alcohol/week: 0.0 standard drinks  . Drug use: Not Currently    Types: Marijuana, "Crack" cocaine    Comment: hx marijuana usemany years ago, Crack -none since 11/2015  .  Sexual  activity: Not Currently    Partners: Male    Birth control/protection: Post-menopausal

## 2018-11-04 NOTE — Therapy (Signed)
[place], [place], [address] Phone: [phone]   Fax:  [phone]  Physical Therapy Treatment  Patient Details  Name: [name] MRN: [id number] Date of Birth: 11-Jan-1966 Referring Provider (PT): [name], MD   Encounter Date: 11/04/2018  PT End of Session - 11/04/18 0723    Visit Number  4    Number of Visits  16    Date for PT Re-Evaluation  12/11/18    Authorization Type  [employer] MCR    PT Start Time  0715    PT Stop Time  0753    PT Time Calculation (min)  38 min       Past Medical History:  Diagnosis Date  . Anxiety   . Arthritis 04-10-11   hips, shoulders, back  . Asthma   . Bipolar disorder ([place])   . Cancer ([place]) 1993   cervical, no treatment done, went away per pt  . Cervical dysplasia or atypia 04-10-11   '93- once dx.-got pregnant-no intervention, then postpartum, no dysplasia found  . CHF (congestive heart failure) ([place])    no cardiologist 2014 dx, none now  . Chronic kidney disease    stage 3 kidney disease  . 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 Pathology showed Condyloma, gluteal CONDYLOMA ACUMINATUM.   [name] Kitchen COPD (chronic obstructive pulmonary disease) ([place])   . Diabetes mellitus without complication ([place])   . Dyspnea    with actity, sitting  . Fall 12/16/2016  . Fibrocystic breast disease   . GERD (gastroesophageal reflux disease)   . Hepatitis    hep B-count is low at present,doesn't register  . Hypercalcemia   . Hyperlipidemia   . Hypertension   . Lung nodule 11/2017   right and left lung  . Migraine headache    none recent  . Morbid obesity ([place]) 03/27/2006  . Neuropathy   . Pneumonia    walking pneu 15 yrs. ago  . Skin lesion 03/15/2011   In gluteal crease now s/p removal by Dr. [name] of General Surgery on 3/12. Path shows condyloma.     . Sleep apnea 04-10-11   uses cpap, pt does not know settings  . Sleep apnea   . Trigger finger     left third    Past Surgical History:  Procedure Laterality Date  . ANAL FISTULECTOMY  04/17/2011   Procedure: FISTULECTOMY ANAL;  Surgeon: Imogene Burn. [name], MD;  Location: [facility];  Service: General;  Laterality: N/A;  Excision of Condyloma Gluteal Cleft   . BACK SURGERY  04-10-11   x5-Lumbar fusion-retained hardware.(Dr. Louanne Skye)  . BREAST CYST EXCISION     bilateral breast, 3 cysts removed from each breast  . BREAST EXCISIONAL BIOPSY Right    x 3  . BREAST EXCISIONAL BIOPSY Left    x 3  . CARPAL TUNNEL RELEASE Bilateral   . Cervical biospy    . COLONOSCOPY WITH PROPOFOL N/A 07/06/2015   Procedure: COLONOSCOPY WITH PROPOFOL;  Surgeon: [name], MD;  Location: [facility];  Service: Endoscopy;  Laterality: N/A;  . DOPPLER ECHOCARDIOGRAPHY  04/06/2012   AT [facility] 55-60%  . FRACTURE SURGERY    . I&D EXTREMITY Left 12/18/2016   Procedure: IRRIGATION AND DEBRIDEMENT LEFT FOOT, CLOSURE;  Surgeon: [name], MD;  Location: [facility];  Service: Orthopedics;  Laterality: Left;  [name] Kitchen MULTIPLE TOOTH EXTRACTIONS    . NECK SURGERY  04-10-11   x3- cervical  fusion with plating and screws-Dr. [name]  . TEE WITHOUT CARDIOVERSION N/A 04/06/2012   Procedure: TRANSESOPHAGEAL ECHOCARDIOGRAM (TEE);  Surgeon: [name], MD;  Location: [facility] ENDOSCOPY;  Service: Cardiovascular;  Laterality: N/A;  . TRIGGER FINGER RELEASE Right    middle finger  . TRIGGER FINGER RELEASE Left 06/28/2016   Procedure: RELEASE TRIGGER FINGER LEFT 3RD FINGER;  Surgeon: [name], MD;  Location: [facility];  Service: Orthopedics;  Laterality: Left;  . TRIGGER FINGER RELEASE Right 06/12/2016   Procedure: RIGHT INDEX FINGER TRIGGER RELEASE;  Surgeon: [name], MD;  Location: [facility];  Service: Orthopedics;  Laterality: Right;  . TRIGGER FINGER RELEASE Right 01/19/2018   Procedure: RIGHT RING FINGER TRIGGER FINGER RELEASE;  Surgeon: [name], MD;  Location: [facility];  Service: Orthopedics;  Laterality: Right;     There were no vitals filed for this visit.  Subjective Assessment - 11/04/18 0719    Subjective  Sorry I missed last appointment, I was sick. I have my TENS unit on today for my neck. I got my theracane in the mail yesterday.    Currently in Pain?  Yes    Pain Score  9     Pain Location  Back    Pain Orientation  Lower    Pain Descriptors / Indicators  Aching    Aggravating Factors   activity    Pain Relieving Factors  rest    Pain Score  9    Pain Location  Neck    Pain Orientation  Right;Left    Pain Descriptors / Indicators  Aching    Aggravating Factors   turning neck, looking up    Pain Relieving Factors  muscle rub, massaging it         [facility] PT Assessment - 11/04/18 0001      AROM   Cervical - Right Side Bend  17    Cervical - Left Side Bend  17    Cervical - Right Rotation  35    Cervical - Left Rotation  30                   [facility] Adult PT Treatment/Exercise - 11/04/18 0001      Neck Exercises: Seated   Neck Retraction  10 reps    Postural Training  Seated shoulder rolls  and scapular squeezes     Other Seated Exercise  Levator stretch AROM     Other Seated Exercise  Seated Cervical AROM  x 5 each way       Lumbar Exercises: Stretches   Active Hamstring Stretch  3 reps;20 seconds    Active Hamstring Stretch Limitations  seated    Standing Side Bend Limitations  seated side bends without UE     Other Lumbar Stretch Exercise  seated lumbar flexion stretch x 3       Lumbar Exercises: Seated   Hip Flexion on Ball Limitations  seated march with abdominal brace x 5     Other Seated Lumbar Exercises  Seated abdominal drawins/pelvic tilits x 10 , increased pain     Other Seated Lumbar Exercises  seated ball squeeze with abdominal draw in , seated red band clam x 20 with abdominal draw in                PT Short Term Goals - 10/15/18 0744      PT SHORT TERM GOAL #1   Title  Pt will be I with HEP  for strength and ROM  neck /back/finger     Time  3    Period  Weeks    Status  New      PT SHORT TERM GOAL #2   Title  She will report neck pain decreased 25% or more    Time  4    Period  Weeks    Status  New      PT SHORT TERM GOAL #3   Title  she will report back pain decreased   25% or more    Time  4    Period  Weeks    Status  New      PT SHORT TERM GOAL #4   Title  Her finger will improve  extension by  degrees    Time  4    Period  Weeks    Status  New        PT Long Term Goals - 10/15/18 0746      PT LONG TERM GOAL #1   Title  Pt will be I with more advanced HEP for LEs , back and neck    Time  8    Period  Weeks    Status  New      PT LONG TERM GOAL #2   Title  She will report able to  sit for 45-60 min  due to decreaed back pain    Time  8    Period  Weeks    Status  New      PT LONG TERM GOAL #3   Title  She will be able to home tasks perform more    due to decreased  neck and back   pain    Time  8    Period  Weeks    Status  New      PT LONG TERM GOAL #4   Title  She will report pain decreased 50% or more  in back and neck so she is able to walk out of home for 15-30 min with min incr pain    Time  8    Period  Weeks    Status  New      PT LONG TERM GOAL #5   Title  FOTO score will improve to  70 % limited related to neck    Time  8    Period  Weeks    Status  New            Plan - 11/04/18 0754    Clinical Impression Statement  Pt reports pain 9/10 today. She is wearing her TENS unit. She was instructed to wear in 24 hours on her neck and then switch it to her back. I asked her to double check those instructions as TENS units loose their effectiveness with prolonged exposure. Continued with seated therex today as she declines to lye down today. She demonstrates increased cervical AROM compared to initial measures. She is going to MD today to discuss scheduling neck surgery.    PT Next Visit Plan  HEP , modalities and manual for ROM and pain as toelrated  , review HEP    PT  Home Exercise Plan  PPT, LTR, shoulder rolls, thumb ROM, supine march, bent knee fall outs, neck rom       Patient will benefit from skilled therapeutic intervention in order to improve the following deficits and impairments:  Difficulty walking, Obesity, Pain, Postural dysfunction, Decreased strength, Decreased activity tolerance, Increased muscle  spasms  Visit Diagnosis: Muscle weakness (generalized)  Cervicalgia  Chronic bilateral low back pain, unspecified whether sciatica present  Abnormal posture     Problem List Patient Active Problem List   Diagnosis Date Noted  . Chest pain 09/24/2018  . Tobacco abuse 08/21/2018  . Right groin pain 07/20/2018  . Depressed mood 07/13/2018  . Mouth pain 06/02/2018  . Paroxysmal SVT (supraventricular tachycardia) ([place]) 03/20/2018  . Pure hypercholesterolemia 03/20/2018  . Muscle cramping 02/25/2018  . Chest pressure 02/20/2018  . Nodule of upper lobe of right lung 02/13/2018  . Falls, subsequent encounter 01/30/2018  . Open wound of left foot 11/04/2017  . Essential hypertension 10/27/2017  . Solitary pulmonary nodule 10/07/2017  . Acute on chronic right-sided congestive heart failure ([place])   . Restrictive lung disease secondary to obesity 10/01/2017  . Polycythemia, secondary 10/01/2017  . Chronic viral hepatitis B without delta-agent ([place]) 07/08/2017  . COPD (chronic obstructive pulmonary disease) with chronic bronchitis ([place]) 06/27/2017  . Chronic kidney disease (CKD), stage IV (severe) ([place]) 01/14/2017  . Type 2 diabetes mellitus with stage 3 chronic kidney disease, with long-term current use of insulin ([place]) 12/12/2016  . Urge incontinence of urine 12/05/2016  . Trigger index finger of left hand 07/15/2016  . Back pain 04/07/2014  . Morbid obesity ([place]) 10/02/2012  . Chronic diastolic CHF (congestive heart failure) ([place]) 04/07/2012  . GERD 01/26/2010  . Hypertension 08/04/2008  . Obstructive sleep apnea treated with BiPAP  02/03/2008  . FIBROCYSTIC BREAST DISEASE 10/28/2006  . Hyperlipidemia 03/27/2006  . Obesity hypoventilation syndrome ([place]) 03/27/2006  . Former tobacco use 03/27/2006  . Depression with anxiety 03/27/2006    Dorene Ar, PTA 11/04/2018, 7:58 AM  [facility] [address] [place], [place], [address] Phone: [phone]   Fax:  [phone]  Name: [name] MRN: [id number] Date of Birth: 26-Feb-1965

## 2018-11-06 ENCOUNTER — Telehealth (INDEPENDENT_AMBULATORY_CARE_PROVIDER_SITE_OTHER): Payer: Medicare Other | Admitting: Cardiology

## 2018-11-06 VITALS — BP 131/81 | Ht 70.0 in | Wt 382.6 lb

## 2018-11-06 DIAGNOSIS — M5090 Cervical disc disorder, unspecified, unspecified cervical region: Secondary | ICD-10-CM | POA: Diagnosis not present

## 2018-11-06 DIAGNOSIS — I5032 Chronic diastolic (congestive) heart failure: Secondary | ICD-10-CM

## 2018-11-06 DIAGNOSIS — I110 Hypertensive heart disease with heart failure: Secondary | ICD-10-CM | POA: Diagnosis not present

## 2018-11-06 DIAGNOSIS — J9621 Acute and chronic respiratory failure with hypoxia: Secondary | ICD-10-CM | POA: Diagnosis not present

## 2018-11-06 DIAGNOSIS — I10 Essential (primary) hypertension: Secondary | ICD-10-CM

## 2018-11-06 DIAGNOSIS — G4733 Obstructive sleep apnea (adult) (pediatric): Secondary | ICD-10-CM | POA: Diagnosis not present

## 2018-11-06 NOTE — Patient Instructions (Signed)
Medication Instructions:  Your Physician recommend you continue on your current medication as directed.    If you need a refill on your cardiac medications before your next appointment, please call your pharmacy.   Lab work: None  Testing/Procedures: None  Follow-Up: At [facility], you and your health needs are our priority.  As part of our continuing mission to provide you with exceptional heart care, we have created designated Provider Care Teams.  These Care Teams include your primary Cardiologist (physician) and Advanced Practice Providers (APPs -  Physician Assistants and Nurse Practitioners) who all work together to provide you with the care you need, when you need it. You will need a follow up appointment in 2 months.  Please call our office 2 months in advance to schedule this appointment.  You may see [name], MD or one of the following Advanced Practice Providers on your designated Care Team:   Rosaria Ferries, PA-C . Jory Sims, DNP, ANP

## 2018-11-06 NOTE — Progress Notes (Signed)
Virtual Visit via Video Note   This visit type was conducted due to national recommendations for restrictions regarding the COVID-19 Pandemic (e.g. social distancing) in an effort to limit this patient's exposure and mitigate transmission in our community.  Due to her co-morbid illnesses, this patient is at least at moderate risk for complications without adequate follow up.  This format is felt to be most appropriate for this patient at this time.  All issues noted in this document were discussed and addressed.  A limited physical exam was performed with this format.  Please refer to the patient's chart for her consent to telehealth for [facility].   Date:  67/06/1950   ID:  [name], DOB [birth year], MRN [id number]  Patient Location: Home Provider Location: Home  PCP:  [name], MD  Cardiologist:  [name], MD  Electrophysiologist:  None   Evaluation Performed:  Follow-Up Visit post hospitalization  Chief Complaint:  Post hospital follow up  History of Present Illness:    [name] is a 53 y.o. female with chronic diastolic heart failure, chronic hypoxic respiratory failure, COPD, OSA on PAP, morbid obesity, paroxysmal SVT, type II diabetes on insulin, CKD stage 3, hypertension, hyperlipidemia   The patient does not have symptoms concerning for COVID-19 infection (fever, chills, cough, or new shortness of breath).   Cardiac history: Complex, see my initial notes. Has struggled with diastolic heart failure, has chest pai when she gets fluid overloaded. Was on oxygen, having "breathing crises" that seemed to be related to paroxysmal SVT. She was well controlled for many months, then had an episode requiring hospitalization 08/2018 for chest pain and elevated weight. HsTn unremarkable (6 -> 3), no SVT on monitor. BNP 41.7. Covid negative. Was diuresed, lost fluid, swelling on left side went down while she was laying down but came back when she got back  up. She attributes the episode to a myelogram, gained 16 lbs and started having symptoms.   Today: She accidentally went to the office today, took some time to connect virtually today.   02/16/18 wt 417 lbs. Down to 382 lbs today. Wants to get bariatric surgery. Wants to discuss with surgeon what she needs to get to weight-wise before she can have surgery. Has done nutrition class, very motivated. Knows that the weight has to come off to improve her quality of life and life expectancy. Wants to make sure I agree. Has tolerated many surgeries and anesthesia, understands it carries risk but dedicated to improving her health. Having a lot of joint issues, has had multiple surgeries, pending injections to try to help in the meantime to delay surgery as long as she can.  Overall spirits are up, very motivated, doing an excellent job with her fluids, diet, medications.   Working on getting CPAP mask refitted. Breathing has been stable, nearing baseline. Has been using oxygen intermittently, especially when she is moving around/needing to be active.  Using metolazone M/W/F, nearing her baseline, will switch back to M/Th dosing likely next week.  Denies chest pain, unexpected weight gain. No syncope or palpitations.  BP has been running well, as low as 117/70s, mostly 120s-130s/70s-80s. Initially elevated when she got back to her house this AM, but then normalized.  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 Pathology  showed Condyloma, gluteal CONDYLOMA ACUMINATUM.    COPD (chronic obstructive pulmonary disease) (HCC)    Diabetes mellitus without complication ([place])    Dyspnea    with actity, sitting   Fall 12/16/2016   Fibrocystic breast disease    GERD (gastroesophageal reflux disease)    Hepatitis    hep B-count is low at present,doesn't register   Hypercalcemia    Hyperlipidemia    Hypertension    Lung nodule 11/2017   right and left lung   Migraine headache    none recent   Morbid obesity ([place]) 03/27/2006   Neuropathy    Pneumonia    walking pneu 15 yrs. ago   Skin lesion 03/15/2011   In gluteal crease now s/p removal by Dr. [name] of General Surgery on 3/12. Path shows condyloma.      Sleep apnea 04-10-11   uses cpap, pt does not know settings   Sleep apnea    Trigger finger    left third   Past Surgical History:  Procedure Laterality Date   ANAL FISTULECTOMY  04/17/2011   Procedure: FISTULECTOMY ANAL;  Surgeon: Imogene Burn. [name], MD;  Location: [facility];  Service: General;  Laterality: N/A;  Excision of Condyloma Gluteal Cleft    BACK SURGERY  04-10-11   x5-Lumbar fusion-retained hardware.(Dr. Louanne Skye)   BREAST CYST EXCISION     bilateral breast, 3 cysts removed from each breast   BREAST EXCISIONAL BIOPSY Right    x 3   BREAST EXCISIONAL BIOPSY Left    x 3   CARPAL TUNNEL RELEASE Bilateral    Cervical biospy     COLONOSCOPY WITH PROPOFOL N/A 07/06/2015   Procedure: COLONOSCOPY WITH PROPOFOL;  Surgeon: [name], MD;  Location: [facility];  Service: Endoscopy;  Laterality: N/A;   DOPPLER ECHOCARDIOGRAPHY  04/06/2012   AT [place] 55-60%   FRACTURE SURGERY     I&D EXTREMITY Left 12/18/2016   Procedure: IRRIGATION AND DEBRIDEMENT LEFT FOOT, CLOSURE;  Surgeon: [name], MD;  Location: [place];  Service: Orthopedics;  Laterality: Left;   MULTIPLE TOOTH EXTRACTIONS     NECK SURGERY  04-10-11   x3- cervical fusion with plating and screws-Dr. [name]    TEE WITHOUT CARDIOVERSION N/A 04/06/2012   Procedure: TRANSESOPHAGEAL ECHOCARDIOGRAM (TEE);  Surgeon: [name], MD;  Location: [facility];  Service: Cardiovascular;  Laterality: N/A;   TRIGGER FINGER RELEASE Right    middle finger   TRIGGER FINGER RELEASE Left 06/28/2016   Procedure: RELEASE TRIGGER FINGER LEFT 3RD FINGER;  Surgeon: [name], MD;  Location: [place];  Service: Orthopedics;  Laterality: Left;   TRIGGER FINGER RELEASE Right 06/12/2016   Procedure: RIGHT INDEX FINGER TRIGGER RELEASE;  Surgeon: [name], MD;  Location: [place];  Service: Orthopedics;  Laterality: Right;   TRIGGER FINGER RELEASE Right 01/19/2018   Procedure: RIGHT RING FINGER TRIGGER FINGER RELEASE;  Surgeon: [name], MD;  Location: [place];  Service: Orthopedics;  Laterality: Right;     Current Meds  Medication Sig   ACCU-CHEK SOFTCLIX LANCETS lancets TEST 4 TIMES DAILY   Acetaminophen (  TYLENOL EXTRA STRENGTH PO) Take 1,000 mg by mouth 2 (two) times a day.   ammonium lactate (AMLACTIN) 12 % lotion Apply 1 application topically 2 (two) times daily as needed for dry skin.   atorvastatin (LIPITOR) 40 MG tablet Take 1 tablet (40 mg total) by mouth daily.   b complex vitamins capsule Take 1 capsule by mouth daily.   baclofen (LIORESAL) 10 MG tablet Take 1 tablet (10 mg total) by mouth 3 (three) times daily as needed for muscle spasms.   blood glucose meter kit and supplies KIT Dispense based on patient and insurance preference. Use up to four times daily as directed. (FOR ICD-9 250.00, 250.01).   busPIRone (BUSPAR) 10 MG tablet TAKE 1 TABLET BY MOUTH THREE TIMES A DAY (Patient taking differently: Take 10 mg by mouth 3 (three) times daily. )   Calcium Citrate-Vitamin D (CALCIUM CITRATE + D3 PO) Take 1 tablet by mouth daily.   Capsaicin-Menthol (PAIN RELIEF EX) Apply 1 application topically 3 (three) times daily as needed (shoulder pain).   diclofenac sodium (VOLTAREN) 1 % GEL APPLY 2  GRAMS TO AFFECTED AREA 4 TIMES A DAY (Patient taking differently: Apply 4 g topically 4 (four) times daily. Per pt: applies all over body)   diltiazem (CARDIZEM CD) 120 MG 24 hr capsule Take 1 capsule (120 mg total) by mouth 2 (two) times daily.   docusate sodium (COLACE) 100 MG capsule Take 200 mg by mouth 2 (two) times daily.   febuxostat (ULORIC) 40 MG tablet Take 40 mg by mouth at bedtime.    Flaxseed, Linseed, (FLAXSEED OIL) 1000 MG CAPS Take 1,000 mg by mouth daily.   fluticasone (FLONASE) 50 MCG/ACT nasal spray Place 2 sprays into both nostrils 2 (two) times daily.   Fluticasone-Umeclidin-Vilant (TRELEGY ELLIPTA) 100-62.5-25 MCG/INH AEPB Inhale 1 puff into the lungs daily.   folic acid (FOLVITE) 381 MCG tablet Take 400 mcg by mouth daily.   furosemide (LASIX) 40 MG tablet Take 2 tablets (80 mg total) by mouth 2 (two) times daily.   gabapentin (NEURONTIN) 300 MG capsule TAKE 2 CAPSULES (600 MG TOTAL) BY MOUTH 3 (THREE) TIMES DAILY.   glucose blood (ACCU-CHEK AVIVA PLUS) test strip TEST 4 TIMES DAILY (Patient taking differently: 1 each by Other route 3 (three) times daily. TEST 3 TIMES DAILY)   HYDROcodone-acetaminophen (NORCO) 7.5-325 MG tablet Take 1 tablet by mouth every 6 (six) hours as needed for moderate pain.   hydrOXYzine (ATARAX/VISTARIL) 10 MG tablet TAKE 1 TABLET BY MOUTH EVERY DAY AS NEEDED (Patient taking differently: Take 10 mg by mouth daily as needed for itching. )   Insulin Glargine (LANTUS SOLOSTAR) 100 UNIT/ML Solostar Pen Inject 22 Units into the skin daily before breakfast. (Patient taking differently: Inject 20 Units into the skin daily before breakfast. )   insulin lispro (HUMALOG KWIKPEN) 100 UNIT/ML KwikPen Inject 0.06 mLs (6 Units total) into the skin 3 (three) times daily before meals.   Insulin Pen Needle (B-D UF III MINI PEN NEEDLES) 31G X 5 MM MISC CHECK SUGARS 4 TIMES A DAY BEFORE MEALS AND AT BEDTIME.   ketoconazole (NIZORAL) 2 % cream APPLY 1  FINGERTIP AMOUNT TO EACH FOOT DAILY. (Patient taking differently: Apply 1 application topically daily. Apply 1 fingertip amount to each foot daily.)   loratadine (CLARITIN) 10 MG tablet Take 1 tablet (10 mg total) by mouth daily.   losartan (COZAAR) 100 MG tablet Take 1 tablet (100 mg total) by mouth daily.   metolazone (ZAROXOLYN) 2.5  MG tablet Take 1 tablet (2.5 mg total) by mouth 2 (two) times a week. TAKE 1 TABLET TWICE WEEKLY ON MONDAYS AND THURSDAYS ONLY   montelukast (SINGULAIR) 10 MG tablet TAKE 1 TABLET AT BEDTIME (Patient taking differently: Take 10 mg by mouth at bedtime. )   Multiple Vitamins-Minerals (MULTIVITAMIN WITH MINERALS) tablet Take 1 tablet by mouth daily.   mupirocin ointment (BACTROBAN) 2 % APPLY TO AFFECTED AREA TWICE A DAY (Patient taking differently: Apply 1 application topically 2 (two) times daily. )   NARCAN 4 MG/0.1ML LIQD nasal spray kit Place 1 spray into the nose once.    NON FORMULARY Uses a C-PAP at bedtime   nystatin (MYCOSTATIN) 100000 UNIT/ML suspension TAKE 5 MLS (500,000 UNITS TOTAL) BY MOUTH 4 (FOUR) TIMES DAILY. SWISH AND SWALLOW.   Omega-3 Fatty Acids (FISH OIL) 1000 MG CAPS Take 1,000 mg by mouth daily.    omeprazole (PRILOSEC) 20 MG capsule Take 1 capsule (20 mg total) by mouth daily as needed.   potassium chloride SA (KLOR-CON M20) 20 MEQ tablet Take 1 tablet (20 mEq total) by mouth 2 (two) times a week. Take 1 tablet twice weekly on Monday and Thursday.   PROAIR HFA 108 (90 Base) MCG/ACT inhaler INHALE 2 PUFFS INTO THE LUNGS EVERY 6 (SIX) HOURS AS NEEDED FOR SHORTNESS OF BREATH. (Patient taking differently: Inhale 2 puffs into the lungs every 6 (six) hours as needed for shortness of breath. )   promethazine (PHENERGAN) 25 MG tablet Take 1 tablet (25 mg total) by mouth every 6 (six) hours as needed for nausea.   sertraline (ZOLOFT) 50 MG tablet Take 1 tablet (50 mg total) by mouth daily.   TOVIAZ 8 MG TB24 tablet Take 8 mg by mouth daily.    Vitamin D, Ergocalciferol, (DRISDOL) 50000 units CAPS capsule Take 50,000 Units by mouth daily. For 30 days     Allergies:   Aspirin, Chantix [varenicline tartrate], Methadone hcl, Corticosteroids, and Levofloxacin   Social History   Tobacco Use   Smoking status: Former Smoker    Packs/day: 0.25    Years: 40.00    Pack years: 10.00    Types: Cigarettes    Start date: 01/28/1977    Quit date: 10/19/2018    Years since quitting: 0.0   Smokeless tobacco: Never Used   Tobacco comment: Quit prior to hospitalization using low dose Chantix  Substance Use Topics   Alcohol use: No    Alcohol/week: 0.0 standard drinks   Drug use: Not Currently    Types: Marijuana, "Crack" cocaine    Comment: hx marijuana usemany years ago, Crack -none since 11/2015     Family Hx: The patient's family history includes Alcohol abuse in her father; Arthritis in her sister; Asthma in her sister and another family member; Breast cancer in her maternal aunt; COPD in an other family member; Cancer in her maternal aunt; Depression in her sister; Diabetes in an other family member; Hypertension in an other family member; Pneumonia in her father; Pulmonary embolism in her mother; Sickle cell anemia in her son; Stroke in her father.  ROS:   Please see the history of present illness.    Constitutional: Negative for chills, fever, night sweats, unintentional weight loss  HENT: Negative for ear pain and hearing loss.   Eyes: Negative for loss of vision and eye pain.  Respiratory: Negative for cough, sputum, wheezing.   Cardiovascular: See HPI. Gastrointestinal: Negative for abdominal pain, melena, and hematochezia.  Genitourinary: Negative for dysuria and  hematuria.  Musculoskeletal: Negative for falls and myalgias.  Skin: Negative for itching and rash.  Neurological: Negative for focal weakness, focal sensory changes and loss of consciousness.  Endo/Heme/Allergies: Does not bruise/bleed easily.    Prior CV  studies:   The following studies were reviewed today: Monitor, stress test, echo 2019  Labs/Other Tests and Data Reviewed:    EKG:  An ECG dated 09/24/18 was personally reviewed today and demonstrated:  SR, PAC, IVCD  Recent Labs: 02/23/2018: ALT 26 03/06/2018: TSH 0.469 09/24/2018: B Natriuretic Peptide 41.7; Hemoglobin 16.0; Platelets 282 09/25/2018: BUN 27; Creatinine, Ser 1.51; Potassium 3.3; Sodium 138   Recent Lipid Panel Lab Results  Component Value Date/Time   CHOL 146 09/24/2018 11:36 PM   CHOL 145 10/27/2017 09:44 AM   TRIG 165 (H) 09/24/2018 11:36 PM   HDL 37 (L) 09/24/2018 11:36 PM   HDL 55 10/27/2017 09:44 AM   CHOLHDL 3.9 09/24/2018 11:36 PM   LDLCALC 76 09/24/2018 11:36 PM   LDLCALC 70 10/27/2017 09:44 AM   LDLDIRECT 114 (H) 09/15/2012 03:59 PM    Wt Readings from Last 3 Encounters:  11/06/18 (!) 382 lb 9.6 oz (173.5 kg)  11/04/18 (!) 378 lb (171.5 kg)  11/03/18 (!) 378 lb (171.5 kg)      Objective:    Vital Signs:  BP 131/81    Ht '5\' 10"'  (1.778 m)    Wt (!) 382 lb 9.6 oz (173.5 kg)    LMP 04/10/2006    BMI 54.90 kg/m    VITAL SIGNS:  reviewed GEN:  no acute distress EYES:  sclerae anicteric, EOMI - Extraocular Movements Intact RESPIRATORY:  normal respiratory effort, symmetric expansion CARDIOVASCULAR:  no visible JVD SKIN:  no rash, lesions or ulcers. MUSCULOSKELETAL:  no obvious deformities. NEURO:  alert and oriented x 3, no obvious focal deficit PSYCH:  normal affect  ASSESSMENT & PLAN:    Chronic diastolic heart failure:improving, weight coming back to near her recent baseline.  -has been on metolazone 2.5 mg M/W/F until weight is stable around 380 lbs, likely can return to M/Thurs metolazone dosing next week. Take potassium when she takes metolazone. -continue furosemide as ordered -working on diet, weight loss.  -highly motivated for weight loss. Interested in bariatric surgery as soon as feasible. I wholeheartedly agree with this. Her BMI of  almost 55 affects her cardiovascular system, and I suspect many of her issues will drastically improve with weight loss. She is optimized from a CV standpoint. I will see her back in person in several months (hopefully near time of her surgery) for full preoperative evaluation. -daily weights -she will call us with any issues. We will follow up closely.  Paroxysmal SVT: no recent symptoms -continue diltiazem  Type II diabetes with stage 3 CKD: on insulin and jardiance -working on diet and weight loss -ultimately planning for bariatric surgery, as above  Hypertension: well controlled today -she will continue to monitor. Goal <130/80. Will call if becomes consistently elevated -continue dilitazem, diuretics, losartan  Hypercholesterolemia: continue atorvastatin, lipids rechecked in hospital 08/2018  COVID-19 Education: The signs and symptoms of COVID-19 were discussed with the patient and how to seek care for testing (follow up with PCP or arrange E-visit).  The importance of social distancing was discussed today.  Time:   Today, I have spent 29 minutes with the patient with telehealth technology discussing the above problems.     Medication Adjustments/Labs and Tests Ordered: Current medicines are reviewed at length with the patient  today.  Concerns regarding medicines are outlined above.    There are no Patient Instructions on file for this visit.  Follow Up:  2 mos  Signed, [name], MD  11/06/2018 8:51 AM    [place]

## 2018-11-08 ENCOUNTER — Other Ambulatory Visit: Payer: Self-pay | Admitting: Family Medicine

## 2018-11-08 ENCOUNTER — Encounter: Payer: Self-pay | Admitting: Family Medicine

## 2018-11-08 ENCOUNTER — Encounter: Payer: Self-pay | Admitting: Cardiology

## 2018-11-08 DIAGNOSIS — F320 Major depressive disorder, single episode, mild: Secondary | ICD-10-CM

## 2018-11-09 ENCOUNTER — Ambulatory Visit: Payer: Medicare Other | Admitting: Physical Therapy

## 2018-11-09 MED ORDER — FARXIGA 5 MG PO TABS: 5.0000 mg | ORAL_TABLET | Freq: Every day | ORAL | 2 refills | Status: DC

## 2018-11-09 NOTE — Telephone Encounter (Signed)
Spoke with patient this morning to discuss her blood sugars.  She was checking her sugar when I called and said that it was 286 and she was about to give herself insulin.  She said that she has had a few 100 blood sugars when she does not eat much food during the day but that her sugars have been elevated since seeing me in the clinic.  I informed her that I was starting her on Farxiga 5 mg daily and that I would send it to her pharmacy.  She has any questions or concerns she can feel free to call me back

## 2018-11-10 ENCOUNTER — Ambulatory Visit: Payer: Self-pay | Admitting: *Deleted

## 2018-11-11 ENCOUNTER — Other Ambulatory Visit: Payer: Self-pay

## 2018-11-11 ENCOUNTER — Encounter: Payer: Self-pay | Admitting: Physical Therapy

## 2018-11-11 ENCOUNTER — Ambulatory Visit: Payer: Medicare Other | Admitting: Physical Therapy

## 2018-11-11 DIAGNOSIS — M6281 Muscle weakness (generalized): Secondary | ICD-10-CM

## 2018-11-11 DIAGNOSIS — G8929 Other chronic pain: Secondary | ICD-10-CM

## 2018-11-11 DIAGNOSIS — M545 Low back pain, unspecified: Secondary | ICD-10-CM

## 2018-11-11 DIAGNOSIS — R293 Abnormal posture: Secondary | ICD-10-CM | POA: Diagnosis not present

## 2018-11-11 DIAGNOSIS — M542 Cervicalgia: Secondary | ICD-10-CM | POA: Diagnosis not present

## 2018-11-11 NOTE — Therapy (Addendum)
[place], [place], [address] Phone: [phone]   Fax:  [phone]  Physical Therapy Treatment/Discharge  Patient Details  Name: [name] MRN: [id number] Date of Birth: 12-28-1965 Referring Provider (PT): [name], MD   Encounter Date: 11/11/2018  PT End of Session - 11/11/18 0730    Visit Number  5    Number of Visits  16    Date for PT Re-Evaluation  12/11/18    Authorization Type  [employer] MCR    PT Start Time  0710    PT Stop Time  0740   pt requested to leave early   PT Time Calculation (min)  30 min       Past Medical History:  Diagnosis Date  . Anxiety   . Arthritis 04-10-11   hips, shoulders, back  . Asthma   . Bipolar disorder ([place])   . Cancer ([place]) 1993   cervical, no treatment done, went away per pt  . Cervical dysplasia or atypia 04-10-11   '93- once dx.-got pregnant-no intervention, then postpartum, no dysplasia found  . CHF (congestive heart failure) ([place])    no cardiologist 2014 dx, none now  . Chronic kidney disease    stage 3 kidney disease  . 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 Pathology showed Condyloma, gluteal CONDYLOMA ACUMINATUM.   [name] Kitchen COPD (chronic obstructive pulmonary disease) ([place])   . Diabetes mellitus without complication ([place])   . Dyspnea    with actity, sitting  . Fall 12/16/2016  . Fibrocystic breast disease   . GERD (gastroesophageal reflux disease)   . Hepatitis    hep B-count is low at present,doesn't register  . Hypercalcemia   . Hyperlipidemia   . Hypertension   . Lung nodule 11/2017   right and left lung  . Migraine headache    none recent  . Morbid obesity ([place]) 03/27/2006  . Neuropathy   . Pneumonia    walking pneu 15 yrs. ago  . Skin lesion 03/15/2011   In gluteal crease now s/p removal by Dr. [name] of General Surgery on 3/12. Path shows condyloma.     . Sleep apnea 04-10-11   uses cpap, pt does not know  settings  . Sleep apnea   . Trigger finger    left third    Past Surgical History:  Procedure Laterality Date  . ANAL FISTULECTOMY  04/17/2011   Procedure: FISTULECTOMY ANAL;  Surgeon: Imogene Burn. [name], MD;  Location: [facility];  Service: General;  Laterality: N/A;  Excision of Condyloma Gluteal Cleft   . BACK SURGERY  04-10-11   x5-Lumbar fusion-retained hardware.(Dr. Louanne Skye)  . BREAST CYST EXCISION     bilateral breast, 3 cysts removed from each breast  . BREAST EXCISIONAL BIOPSY Right    x 3  . BREAST EXCISIONAL BIOPSY Left    x 3  . CARPAL TUNNEL RELEASE Bilateral   . Cervical biospy    . COLONOSCOPY WITH PROPOFOL N/A 07/06/2015   Procedure: COLONOSCOPY WITH PROPOFOL;  Surgeon: [name], MD;  Location: [facility];  Service: Endoscopy;  Laterality: N/A;  . DOPPLER ECHOCARDIOGRAPHY  04/06/2012   AT [place] 55-60%  . FRACTURE SURGERY    . I&D EXTREMITY Left 12/18/2016   Procedure: IRRIGATION AND DEBRIDEMENT LEFT FOOT, CLOSURE;  Surgeon: [name], MD;  Location: [facility];  Service: Orthopedics;  Laterality: Left;  [name] Kitchen MULTIPLE TOOTH EXTRACTIONS    . NECK SURGERY  04-10-11   x3- cervical fusion with plating and screws-Dr. [name]  . TEE WITHOUT CARDIOVERSION N/A 04/06/2012   Procedure: TRANSESOPHAGEAL ECHOCARDIOGRAM (TEE);  Surgeon: [name], MD;  Location: [facility] ENDOSCOPY;  Service: Cardiovascular;  Laterality: N/A;  . TRIGGER FINGER RELEASE Right    middle finger  . TRIGGER FINGER RELEASE Left 06/28/2016   Procedure: RELEASE TRIGGER FINGER LEFT 3RD FINGER;  Surgeon: [name], MD;  Location: [place] [facility];  Service: Orthopedics;  Laterality: Left;  . TRIGGER FINGER RELEASE Right 06/12/2016   Procedure: RIGHT INDEX FINGER TRIGGER RELEASE;  Surgeon: [name], MD;  Location: [facility];  Service: Orthopedics;  Laterality: Right;  . TRIGGER FINGER RELEASE Right 01/19/2018   Procedure: RIGHT RING FINGER TRIGGER FINGER RELEASE;  Surgeon: [name], MD;  Location: [facility];   Service: Orthopedics;  Laterality: Right;    There were no vitals filed for this visit.  Subjective Assessment - 11/11/18 0719    Subjective  I am making it this morning. It hurts my shoulders to use  the theracne to reach my neck and back muscles.    Currently in Pain?  Yes    Pain Score  9     Pain Location  Back    Pain Orientation  Lower    Pain Descriptors / Indicators  Aching    Pain Type  Chronic pain    Aggravating Factors   activity    Pain Relieving Factors  rest    Pain Score  9    Pain Location  Neck    Pain Orientation  Left;Right    Pain Descriptors / Indicators  Aching    Pain Type  Chronic pain    Pain Radiating Towards  to shoulders and hands    Aggravating Factors   turning head looking up    Pain Relieving Factors  muscle rub, massaging it myself         [facility] PT Assessment - 11/11/18 0001      Observation/Other Assessments   Focus on Therapeutic Outcomes (FOTO)   74% limited improved from 87% limited                   [facility] Adult PT Treatment/Exercise - 11/11/18 0001      Neck Exercises: Seated   Neck Retraction  10 reps      Lumbar Exercises: Standing   Row  15 reps    Theraband Level (Row)  Level 3 (Green)    Shoulder Extension  10 reps    Theraband Level (Shoulder Extension)  Level 2 (Red)      Lumbar Exercises: Seated   Other Seated Lumbar Exercises  seated on dyna disc: pelvic tilits forward and lateral, circles x 10 each way, abdominal draw in with LAQ, March , ball squeeze , clam     Other Seated Lumbar Exercises  seated ball squeeze with abdominal draw in , seated red band clam x 20 with abdominal draw in                PT Short Term Goals - 11/11/18 0749      PT SHORT TERM GOAL #1   Title  Pt will be I with HEP for strength and ROM  neck /back/finger    Time  3    Period  Weeks    Status  Achieved      PT SHORT TERM GOAL #2   Title  She will report neck pain decreased 25%  or more    Time  4    Period  Weeks     Status  Not Met      PT SHORT TERM GOAL #3   Title  she will report back pain decreased   25% or more    Time  4    Period  Weeks    Status  Not Met      PT SHORT TERM GOAL #4   Title  Her finger will improve  extension by  degrees    Time  4    Period  Weeks    Status  Not Met        PT Long Term Goals - 11/11/18 0750      PT LONG TERM GOAL #1   Title  Pt will be I with more advanced HEP for LEs , back and neck    Time  8    Period  Weeks    Status  Not Met      PT LONG TERM GOAL #2   Title  She will report able to  sit for 45-60 min  due to decreaed back pain    Time  8    Period  Weeks    Status  Not Met      PT LONG TERM GOAL #3   Title  She will be able to home tasks perform more    due to decreased  neck and back   pain    Time  8    Period  Weeks    Status  Not Met      PT LONG TERM GOAL #4   Title  She will report pain decreased 50% or more  in back and neck so she is able to walk out of home for 15-30 min with min incr pain    Time  8    Period  Weeks    Status  Not Met      PT LONG TERM GOAL #5   Title  FOTO score will improve to  70 % limited related to neck    Baseline  74% on status    Time  8    Period  Weeks    Status  Not Met            Plan - 11/11/18 0737    Clinical Impression Statement  Pt arrives in 9/10 pain and reports no improvement over 5 visits. She reports increased pain every session and no progress toward goals. She is appropriate for discharge and return to MD for follow up.    PT Next Visit Plan  discharge to HEP    PT Home Exercise Plan  PPT, LTR, shoulder rolls, thumb ROM, supine march, bent knee fall outs, neck rom       Patient will benefit from skilled therapeutic intervention in order to improve the following deficits and impairments:  Difficulty walking, Obesity, Pain, Postural dysfunction, Decreased strength, Decreased activity tolerance, Increased muscle spasms  Visit Diagnosis: Muscle weakness  (generalized)  Cervicalgia  Chronic bilateral low back pain, unspecified whether sciatica present  Abnormal posture     Problem List Patient Active Problem List   Diagnosis Date Noted  . Chest pain 09/24/2018  . Right groin pain 07/20/2018  . Depressed mood 07/13/2018  . Mouth pain 06/02/2018  . Paroxysmal SVT (supraventricular tachycardia) ([place]) 03/20/2018  . Pure hypercholesterolemia 03/20/2018  . Muscle cramping 02/25/2018  . Nodule of upper lobe of right lung  02/13/2018  . Falls, subsequent encounter 01/30/2018  . Open wound of left foot 11/04/2017  . Essential hypertension 10/27/2017  . Solitary pulmonary nodule 10/07/2017  . Restrictive lung disease secondary to obesity 10/01/2017  . Polycythemia, secondary 10/01/2017  . Chronic viral hepatitis B without delta-agent ([place]) 07/08/2017  . COPD (chronic obstructive pulmonary disease) with chronic bronchitis ([place]) 06/27/2017  . Chronic kidney disease (CKD), stage IV (severe) ([place]) 01/14/2017  . Type 2 diabetes mellitus with stage 3 chronic kidney disease, with long-term current use of insulin ([place]) 12/12/2016  . Urge incontinence of urine 12/05/2016  . Trigger index finger of left hand 07/15/2016  . Back pain 04/07/2014  . Morbid obesity ([place]) 10/02/2012  . Chronic diastolic CHF (congestive heart failure) ([place]) 04/07/2012  . GERD 01/26/2010  . Obstructive sleep apnea treated with BiPAP 02/03/2008  . FIBROCYSTIC BREAST DISEASE 10/28/2006  . Hyperlipidemia 03/27/2006  . Obesity hypoventilation syndrome ([place]) 03/27/2006  . Former tobacco use 03/27/2006  . Depression with anxiety 03/27/2006    Dorene Ar, PTA 11/11/2018, 7:53 AM  [facility] [address] [place], [place], [address] Phone: [phone]   Fax:  [phone]  Name: GENIYAH EISCHEID MRN: [id number] Date of Birth: 1965/07/01  PHYSICAL THERAPY DISCHARGE SUMMARY  Visits from Start of Care:  5  Current functional level related to goals / functional outcomes: Not met. No improvement so returned to MD   Remaining deficits: No improvement   Education / Equipment: HEP  Plan: Patient agrees to discharge.  Patient goals were not met. Patient is being discharged due to                                                     ?????  Lack of progress   Pearson Forster Pt 01/05/19

## 2018-11-12 ENCOUNTER — Encounter: Payer: Self-pay | Admitting: Family Medicine

## 2018-11-12 NOTE — Telephone Encounter (Signed)
I spoke with patient regarding [name].  Informed her that I have still not received anything from [facility].  She says that she will contact them and see if they can send the request or information electronically rather than fax.  Informed [name] that I would try to get back to her tomorrow regarding which medication she should start.  I told her that other SLG2 inhibitors would be acceptable but I need to know which one her insurance will approve.

## 2018-11-13 ENCOUNTER — Encounter: Payer: Self-pay | Admitting: Family Medicine

## 2018-11-13 MED ORDER — JARDIANCE 10 MG PO TABS: 10.0000 mg | ORAL_TABLET | Freq: Every day | ORAL | 1 refills | Status: DC

## 2018-11-13 NOTE — Telephone Encounter (Signed)
I did not see that that was in your med list.  It is not showing up on my hand but it is in your notes from the cardiologist.  You are taking 25 mg Jardiance.  Continue taking that and we will work on the best option for changes.  Do not fill the prescription that I sent into the pharmacy today.

## 2018-11-13 NOTE — Telephone Encounter (Signed)
Called patient's pharmacy who said that [name] is approved but [name] has not.  I changed the prescription to [name] 10 mg daily and gave her 1 refill so that she will have to be seen in 2 months for diabetes check.  Called [name] informed her of the changes.  Also discussed signs and symptoms of hypoglycemia and what to do if that occurs.  Told her to feel free to call back if she has any questions or concerns.

## 2018-11-16 ENCOUNTER — Other Ambulatory Visit: Payer: Self-pay | Admitting: Student in an Organized Health Care Education/Training Program

## 2018-11-16 ENCOUNTER — Ambulatory Visit: Payer: Medicare Other | Admitting: Physical Therapy

## 2018-11-17 ENCOUNTER — Encounter: Payer: Self-pay | Admitting: Family Medicine

## 2018-11-17 NOTE — Telephone Encounter (Signed)
Spoke with patient on the phone.  Discussed her medication options and recommended she meet with Dr. Valentina Lucks to discuss further.  She agrees to call the clinic and make an appointment with Dr. Valentina Lucks.

## 2018-11-18 ENCOUNTER — Encounter: Payer: Self-pay | Admitting: Family Medicine

## 2018-11-18 ENCOUNTER — Encounter: Payer: Self-pay | Admitting: Specialist

## 2018-11-18 ENCOUNTER — Other Ambulatory Visit: Payer: Self-pay | Admitting: Podiatry

## 2018-11-18 DIAGNOSIS — E1151 Type 2 diabetes mellitus with diabetic peripheral angiopathy without gangrene: Secondary | ICD-10-CM

## 2018-11-18 DIAGNOSIS — B353 Tinea pedis: Secondary | ICD-10-CM

## 2018-11-19 ENCOUNTER — Ambulatory Visit (INDEPENDENT_AMBULATORY_CARE_PROVIDER_SITE_OTHER): Payer: Medicare Other | Admitting: Pharmacist

## 2018-11-19 ENCOUNTER — Other Ambulatory Visit: Payer: Self-pay

## 2018-11-19 ENCOUNTER — Ambulatory Visit: Payer: Medicare Other

## 2018-11-19 ENCOUNTER — Encounter: Payer: Self-pay | Admitting: Pharmacist

## 2018-11-19 ENCOUNTER — Encounter: Payer: Self-pay | Admitting: Family Medicine

## 2018-11-19 DIAGNOSIS — Z794 Long term (current) use of insulin: Secondary | ICD-10-CM

## 2018-11-19 DIAGNOSIS — J449 Chronic obstructive pulmonary disease, unspecified: Secondary | ICD-10-CM | POA: Diagnosis not present

## 2018-11-19 DIAGNOSIS — Z87891 Personal history of nicotine dependence: Secondary | ICD-10-CM | POA: Diagnosis not present

## 2018-11-19 DIAGNOSIS — K146 Glossodynia: Secondary | ICD-10-CM

## 2018-11-19 DIAGNOSIS — N183 Chronic kidney disease, stage 3 unspecified: Secondary | ICD-10-CM | POA: Diagnosis not present

## 2018-11-19 DIAGNOSIS — E1122 Type 2 diabetes mellitus with diabetic chronic kidney disease: Secondary | ICD-10-CM | POA: Diagnosis not present

## 2018-11-19 DIAGNOSIS — G4733 Obstructive sleep apnea (adult) (pediatric): Secondary | ICD-10-CM | POA: Diagnosis not present

## 2018-11-19 MED ORDER — OZEMPIC (0.25 OR 0.5 MG/DOSE) 2 MG/1.5ML ~~LOC~~ SOPN: 0.2500 mg | PEN_INJECTOR | SUBCUTANEOUS | 0 refills | Status: DC

## 2018-11-19 MED ORDER — NYSTATIN 100000 UNIT/ML MT SUSP: 5.0000 mL | Freq: Four times a day (QID) | OROMUCOSAL | 3 refills | Status: DC

## 2018-11-19 NOTE — Assessment & Plan Note (Signed)
Diabetes longstanding currently uncontrolled. Patient is very adherent with medication. Control is suboptimal due to previous steroid use, stress and sedentary lifestyle. -Continued basal insulin Lantus (insulin glargine) at 20 units daily, she reports does not want to go above 20 units of insulin glargine due to possible side effects of insulin -Continued  rapid insulin lispro (insulin Humalog) 6 units three times daily before each meal -Started GLP-1 Ozempic (generic name semaglutide) with 0.25 mg once weekly. First injection done in clinic today. Patient is excited about this medication and it's possibility to assist her with weight loss.  Medication Samples have been provided to the patient. Drug name: semaglutide (Ozempic)       Strength: 2 mg/1.5 ml        Qty: 1 pen  LOT: [id number]  Exp.Date: 11/27/2020 Dosing instructions: inject 0.25 mg into the skin once weekly.  The patient has been instructed regarding the correct time, dose, and frequency of taking this medication, including desired effects and most common side effects.   Janeann Forehand 10:03 AM 11/19/2018 -Clarified SGLT2-I usage, is taking Jardiance (empagliflozin) 25 mg as prescribed by her cardiologist and is not taking Farxiga (dapagliflozin). -Discussed pathophysiology of DM. -Encouraged her to call the pharmacy clinic if any blood sugars that are concerning to her or are < 100-130.  -Next A1C anticipated January 2021.

## 2018-11-19 NOTE — Progress Notes (Signed)
S:    Chief Complaint  Patient presents with  . Medication Management    DM     Patient arrives in normal spirits, ambulating with mild shortness of breath noted which improved with sitting for a few minutes.  She is on home oxygen but does not like to carry it with her because it is too heavy. Presents for diabetes evaluation, education, and management Patient was referred and last seen by Primary Care Provider Dr. Caron Presume on 11/23/2018.    Patient reports Diabetes was diagnosed in October of 2018.   Family/Social History: family hx of diabetes with aunt, grandmother, and "everyone on mother's side".   Insurance coverage/medication affordability: Medicare/Medicaid   Patient reports adherence with medications.  Current diabetes medications include: insulin glargine (Lantus) 20 units every morning, insulin lispro (Humalog) 6 units before each meal, empagliflozin (Jardiance) 25 mg daily Current hypertension medications include: losartan 100 mg daily  Current hyperlipidemia medications include: atorvastatin 40, fish oil 1 gram daily   Patient denies hypoglycemic events.  Patient reported dietary habits: Eats 3 meals/day Breakfast: smoothie with protein  Lunch: mixed greens with smoked [place] wings Dinner:seafood, vegetables, baked chicken wings, mixed greens with smoked [place] Snacks: light salt potato chips, sugar free popsicles, sugar free jello Drinks: water, half cup of cherry juice at night for gout, diet mountain dew, zero sugar powerade  Patient-reported exercise habits: none reported   O:  Physical Exam Constitutional:      Appearance: She is obese.  Neurological:     Mental Status: She is alert.    Review of Systems  Respiratory: Positive for shortness of breath.   Musculoskeletal: Positive for back pain.     Lab Results  Component Value Date   HGBA1C 8.2 (A) 11/03/2018   Vitals:   11/19/18 0929  BP: 120/76  Pulse: 83  SpO2: 96%    Lipid Panel    Component Value Date/Time   CHOL 146 09/24/2018 2336   CHOL 145 10/27/2017 0944   TRIG 165 (H) 09/24/2018 2336   HDL 37 (L) 09/24/2018 2336   HDL 55 10/27/2017 0944   CHOLHDL 3.9 09/24/2018 2336   VLDL 33 09/24/2018 2336   LDLCALC 76 09/24/2018 2336   LDLCALC 70 10/27/2017 0944   LDLDIRECT 114 (H) 09/15/2012 1559    Home fasting CBG: consistently between 200-300 (two readings <200), 7 day average is 244, 14 day average is 241    Clinical ASCVD: No  The 10-year ASCVD risk score Mikey Bussing DC Jr., et al., 2013) is: 19.1%   Values used to calculate the score:     Age: 53 years     Sex: Female     Is Non-Hispanic African American: Yes     Diabetic: Yes     Tobacco smoker: Yes     Systolic Blood Pressure: 725 mmHg     Is BP treated: Yes     HDL Cholesterol: 37 mg/dL     Total Cholesterol: 146 mg/dL    A/P: Diabetes longstanding currently uncontrolled. Patient is very adherent with medication. Control is suboptimal due to previous steroid use, stress and sedentary lifestyle. -Continued basal insulin Lantus (insulin glargine) at 20 units daily, she reports does not want to go above 20 units of insulin glargine due to possible side effects of insulin -Continued  rapid insulin lispro (insulin Humalog) 6 units three times daily before each meal -Started GLP-1 Ozempic (generic name semaglutide) with 0.25 mg once weekly. First injection done in clinic today.  Patient is excited about this medication and it's possibility to assist her with weight loss.  Medication Samples have been provided to the patient. Drug name: semaglutide (Ozempic)       Strength: 2 mg/1.5 ml        Qty: 1 pen  LOT: [id number]  Exp.Date: 11/27/2020 Dosing instructions: inject 0.25 mg into the skin once weekly.  The patient has been instructed regarding the correct time, dose, and frequency of taking this medication, including desired effects and most common side effects.   Janeann Forehand 10:03 AM 11/19/2018 -Clarified  SGLT2-I usage, is taking Jardiance (empagliflozin) 25 mg as prescribed by her cardiologist and is not taking Farxiga (dapagliflozin). -Discussed pathophysiology of DM. -Encouraged her to call the pharmacy clinic if any blood sugars that are concerning to her or are < 100-130.  -Next A1C anticipated January 2021.    ASCVD risk - primary prevention in patient with DM. Last LDL is controlled at 76 (08/2018). ASCVD risk score is not >20% (19%) - high intensity statin indicated. Aspirin is not indicated.  -Continued atorvastatin 40 mg.   Hypertension currently well controlled.  BP goal = <140/90 mmHg. Patient is adherent with medication. She does test at home, reports average readings less than or equal to 130/80  Tobacco use- quit in April of 2019, but had subsequently started smoking earlier this year due to family stressors. She got up to 2 packs daily. Now her current use is 3-4 cigarettes daily. Patient is motivated to quit. May have some other family stressors impacting tobacco use.   - patient reports she has dry mouth of which she takes nystatin oral solution for. Refill was sent for this prescription to her pharmacy. Ensured that patient is rinsing her mouth after each use of Trelegy inhaler to prevent thrush (none noted)  Written patient instructions provided.  Total time in face to face counseling 20 minutes.   Follow up Pharmacist Clinic Visit in 4 weeks (12/17/2018 appt scheduled).   Patient seen with Simonne Come, PharmD Candidate and Eddie Candle, PharmD PGY-1 Resident.

## 2018-11-19 NOTE — Patient Instructions (Addendum)
Start Ozempic (semaglutide) 0.25 mg injected subcutaneously once weekly on Thursdays. First dose completed in-office today.  Continue insulin Lantus 20 units subcutaneously every morning before breakfast. Continue insulin Humalog 6 units subcutaneously three times daily before meals. Tobacco cessation- Keep up the good work! Try decreasing your daily amount.  Follow up visit 11/19

## 2018-11-19 NOTE — Assessment & Plan Note (Signed)
Tobacco use- quit in April of 2019, but had subsequently started smoking earlier this year due to family stressors. She got up to 2 packs daily. Now her current use is 3-4 cigarettes daily. Patient is motivated to quit. May have some other family stressors impacting tobacco use.

## 2018-11-19 NOTE — Telephone Encounter (Signed)
I spoke with [name] on the phone and informed her that I would fill the forms she is requesting when I receive them.  I let her know that I am on a different service and may not make it into the clinic until Monday but will check into it then.  She informs me she is having difficulties with everyday life such as opening jars and bottles.  Her help can only come for 2-1/2 hours every other day and she says that that is not enough.

## 2018-11-19 NOTE — Progress Notes (Signed)
Reviewed: Agree with Dr. Koval's documentation and management. 

## 2018-11-20 ENCOUNTER — Encounter: Payer: Self-pay | Admitting: Family Medicine

## 2018-11-22 ENCOUNTER — Other Ambulatory Visit: Payer: Self-pay | Admitting: Family Medicine

## 2018-11-23 ENCOUNTER — Other Ambulatory Visit: Payer: Self-pay

## 2018-11-23 ENCOUNTER — Ambulatory Visit
Admission: RE | Admit: 2018-11-23 | Discharge: 2018-11-23 | Disposition: A | Discharge status: Home / Self Care | Payer: Medicare Other | Source: Ambulatory Visit | Attending: Specialist | Admitting: Specialist

## 2018-11-23 DIAGNOSIS — M25511 Pain in right shoulder: Secondary | ICD-10-CM

## 2018-11-23 DIAGNOSIS — G8929 Other chronic pain: Secondary | ICD-10-CM

## 2018-11-24 ENCOUNTER — Telehealth: Payer: Self-pay | Admitting: Family Medicine

## 2018-11-24 ENCOUNTER — Telehealth (INDEPENDENT_AMBULATORY_CARE_PROVIDER_SITE_OTHER): Payer: Medicare Other | Admitting: Psychology

## 2018-11-24 DIAGNOSIS — I5022 Chronic systolic (congestive) heart failure: Secondary | ICD-10-CM

## 2018-11-24 DIAGNOSIS — R600 Localized edema: Secondary | ICD-10-CM

## 2018-11-24 DIAGNOSIS — E8770 Fluid overload, unspecified: Secondary | ICD-10-CM

## 2018-11-24 DIAGNOSIS — F320 Major depressive disorder, single episode, mild: Secondary | ICD-10-CM

## 2018-11-24 MED ORDER — FUROSEMIDE 40 MG PO TABS: 80.0000 mg | ORAL_TABLET | Freq: Two times a day (BID) | ORAL | 3 refills | Status: DC

## 2018-11-24 NOTE — Telephone Encounter (Signed)
Thank you Dr. Owens Shark,  I will be in the clinic tomorrow morning to see patients. Maybe I can get it taken care of while I'm there.  Christy Sartorius

## 2018-11-24 NOTE — Telephone Encounter (Signed)
  Care Coordination Clinical Social Work  Personal Care Services Referral  09/64/3838 Name: [name] MRN: [id number] DOB: Aug 19, 1965  Referred by: Dr. Franchot Gallo [name] is a 53 y.o. year old female who is a primary care patient of [name], MD.  LCSW was consulted to assist with completing [place] Referral.  Demographics of [name] referral completed and returned to  [place], [place] / [place]   [phone] 12:21 PM

## 2018-11-24 NOTE — Telephone Encounter (Signed)
Discussed personal care assistant form with [name], LCSW. She will type up appropriate diagnoses and return to MD for review and signature.  Dorris Singh, MD  Family Medicine Teaching Service

## 2018-11-25 ENCOUNTER — Other Ambulatory Visit: Payer: Self-pay

## 2018-11-25 ENCOUNTER — Other Ambulatory Visit: Payer: Self-pay | Admitting: *Deleted

## 2018-11-25 ENCOUNTER — Encounter: Payer: Self-pay | Admitting: Family Medicine

## 2018-11-25 NOTE — Progress Notes (Signed)
Integrated Behavioral Health Visit via Telemedicine (Telephone)  34/[birth year]90 [name] [id number]   Session Start time: 210  Session End time: 69 Total time: 20  Referring Provider: Dr. Caron Presume Type of Visit: Telephonic Patient location: home [facility] Provider location: [facility] All persons participating in visit: provider and pt   Confirmed patient's address: Yes  Confirmed patient's phone number: Yes    Discussed confidentiality: Yes    The following statements were read to the patient and/or legal guardian that are established with the [facility] Provider.  "The purpose of this phone visit is to provide behavioral health care while limiting exposure to the coronavirus (COVID19).  There is a possibility of technology failure and discussed alternative modes of communication if that failure occurs."  "By engaging in this telephone visit, you consent to the provision of healthcare.  Additionally, you authorize for your insurance to be billed for the services provided during this telephone visit."    PRESENTING CONCERNS: Patient and/or family reports the following symptoms/concerns: Pt is presenting with depressed mood. Pt repored she has had syx of lack of motivation and feeling down.  Pt reported she is dealing with issues regarding her son that impact her mood.  Pt reported she has not been engaging coping skills talked about last appt.  Pt reported she is interested in changing or increasing medication (provider notified) Duration of problem: 3 months; Severity of problem: mild  STRENGTHS (Protective Factors/Coping Skills): Pt engages in praying and identified support systems.   GOALS ADDRESSED: Patient will: 1.  Reduce symptoms of: depression  2.  Increase knowledge and/or ability of: coping skills  3.  Demonstrate ability to: Increase healthy adjustment to current life circumstances  INTERVENTIONS: Interventions utilized:  Brief CBT Standardized Assessments  completed: Not Needed  ASSESSMENT: Patient currently experiencing depressed mood. Pt would benefit from continued medication and engaging in CBT coping strategies.   Patient may benefit from CBT coping strategies.   PLAN: 1. Follow up with behavioral health clinician on : Engagement in CBT coping strategies  2. Behavioral recommendations: Continued work on coping strategies and finding what strategies challenge depressed mood 3. Referral(s): [place] (In Clinic)  Jerilee Hoh

## 2018-11-25 NOTE — Patient Outreach (Addendum)
Telephone outreach.  Pt is doing fair. Today and since Monday she has been having some abdominal cramping. She reports she is moving her bowels everday and passing gas freely. She does not have any idea if she ate something that is bothering her. She does acknowledge that she has GERD and DM (which see may be experiencing some gastroparesis).  Have recommended she drink fluids and a bland diet, smaller meals for the next couple of days and I will call her on Friday to follow up. I have discouraged a trip to the ED without first calling her primary care.  She updated me that she has been started on Ozempic and she says her glucose levels are coming down although the values she has given me are >160 but <200. Her Hgb A!C was down to 8.3!!!  Will call on Friday to follow up.  [name]. Myrtie Neither, MSN, [facility] Gerontological Nurse Practitioner [facility] Care Management [phone]

## 2018-11-26 DIAGNOSIS — Z79899 Other long term (current) drug therapy: Secondary | ICD-10-CM | POA: Diagnosis not present

## 2018-11-26 DIAGNOSIS — M109 Gout, unspecified: Secondary | ICD-10-CM | POA: Diagnosis not present

## 2018-11-27 ENCOUNTER — Other Ambulatory Visit: Payer: Self-pay

## 2018-11-27 ENCOUNTER — Encounter: Payer: Self-pay | Admitting: *Deleted

## 2018-11-27 ENCOUNTER — Ambulatory Visit: Payer: Medicare Other | Admitting: Specialist

## 2018-11-27 ENCOUNTER — Other Ambulatory Visit: Payer: Self-pay | Admitting: *Deleted

## 2018-11-27 NOTE — Patient Outreach (Signed)
Telephone out reach to follow up on pt abdominal pain:  [name] reports her stomach pain is gone and she figured out what happened. Someone brought her some macaroni and cheese which she ate and she is lactose intolerant.  I shared with her that I did also find out that the #1 reported side effect for Ozempic is GI complaints.   We discussed continued care management calls or if she felt like she is good for her case to be closed and she chooses to close. She has met all her care plan goals and has not completed her advanced directives which I have encouraged to do all along.  Reinforced that I am available to her at any time in the future for questions or problems. Encouraged her to continue to keep herself safe with COVID safety precautions.  I will send her a closure letter and advise her primary care.  [facility] CM Care Plan Problem One     Most Recent Value  Care Plan Problem One  No Advanced Directives or MOST form.  (Pended)   Role Documenting the Problem One  Care Management Coordinator  (Pended)   Care Plan for Problem One  Not Active  (Pended)     [facility] CM Care Plan Problem Two     Most Recent Value  Care Plan Problem Two  Multiple co-morbidities (DM, CHF, HTN) that require monitoring and medication management.  (Pended)   Role Documenting the Problem Two  Care Management Coordinator  (Pended)   Care Plan for Problem Two  Active  (Pended)     [facility] CM Care Plan Problem Three     Most Recent Value  Care Plan Problem Three  Hgb A1C 9.4 in June.  (Pended)   Role Documenting the Problem Three  Care Management Coordinator  (Pended)   Care Plan for Problem Three  Active  (Pended)   [facility] Long Term Goal   Pt HgbA1C will be <8.5 on next check  (Pended)   [facility] Long Term Goal Start Date  08/04/18  (Pended)   [place] Term Goal Met Date  11/25/18  (Pended)      Kayleen Memos C. Myrtie Neither, MSN, [facility] Gerontological Nurse Practitioner [facility] Care Management [phone]

## 2018-11-30 ENCOUNTER — Encounter: Payer: Self-pay | Admitting: Neurology

## 2018-11-30 ENCOUNTER — Ambulatory Visit (INDEPENDENT_AMBULATORY_CARE_PROVIDER_SITE_OTHER): Payer: Medicare Other | Admitting: Neurology

## 2018-11-30 ENCOUNTER — Other Ambulatory Visit: Payer: Self-pay

## 2018-11-30 VITALS — BP 149/86 | HR 97 | Temp 97.5°F | Ht 70.0 in | Wt 373.0 lb

## 2018-11-30 DIAGNOSIS — G8929 Other chronic pain: Secondary | ICD-10-CM

## 2018-11-30 DIAGNOSIS — M542 Cervicalgia: Secondary | ICD-10-CM | POA: Diagnosis not present

## 2018-11-30 DIAGNOSIS — G4733 Obstructive sleep apnea (adult) (pediatric): Secondary | ICD-10-CM | POA: Diagnosis not present

## 2018-11-30 NOTE — Progress Notes (Signed)
Subjective:    Patient ID: [name] is a 53 y.o. female.  HPI     Interim history:   [name] is a 53 year old right-handed woman with an underlying medical complex medical history of COPD, congestive heart failure with oxygen dependence, urinary incontinence, diabetes, neuropathy, hypertension, hyperlipidemia, status post multiple surgeries, and morbid obesity with BMI of over 55, who presents for a new problem visit and is referred by Dr. Louanne Skye for recurrent occipital pain. We had a phone call follow-up visit in March 2020, at which time she was compliant with BiPAP and her oxygen.  She was doing well From the sleep apnea standpoint.  She saw [name], nurse practitioner in the interim on 10/26/2018, at which time she was compliant with her BiPAP.  She was advised to follow-up in 6 months.   I had a phone call visit with her on 04/22/2018, at which time she reported doing well with her BiPAP and supplemental oxygen.    On 11/30/2018:  She reports that she has not been using her BiPAP lately because she needs new supplies.  She found a mask that she likes. She reports right shoulder pain and decreased range of motion.  She also reports a longstanding history of neck pain.  Sometimes it radiates upwards, sometimes into the shoulder.   She had a cervical spine MRI without contrast on 09/21/2018 and I reviewed the results: IMPRESSION: Unchanged postoperative appearance of the cervical spine with posterior fusion from C4-T2 and ACDF at C7-T1. No spinal canal stenosis or neural impingement.  Of note, more recently, on 11/23/2018 she had an MRI of the right shoulder and I reviewed the results: IMPRESSION: 1. Small full-thickness insertional tear of the supraspinatus tendon anteriorly. No significant tendon retraction or muscular atrophy. 2. Partial insertional tearing of the infraspinatus tendon. 3. Mild bicipital tendinosis. 4. No evidence of labral or biceps tendon tear.   She  reports that she has an appointment with her primary care physician tomorrow, she has a follow-up with Dr. Louanne Skye this week as well.  She has looked into bariatric surgery and has started the process for this. She is currently not using her BiPAP essentially since August 2020 because she needed new supplies.  She tried a different mask but her insurance will not cover it until 6 months from her last mask renewal which was in or around July 2020.  She currently only uses her oxygen at night.  She complains of severe mouth dryness and has tried Biotene mouthwash and toothpaste.  She tries to hydrate with water.  She has had epidural steroid injections and had severe increase in her blood sugar levels, last epidural steroid injection per her report was in 2018.  She had a trigger finger injection in April 2020 which also increased her blood sugar level.  She reports pain in the posterior neck and radiating upwards.  This is more on the left side.  She reports significant right shoulder pain and decrease in range of motion.     The patient's allergies, current medications, family history, past medical history, past social history, past surgical history and problem list were reviewed and updated as appropriate.    Previously:  I first met her on 12/30/2017 at the request of Dr. Dorris Singh, at which time the patient reported a prior diagnosis of obstructive sleep apnea. I reviewed sleep study results from 2010. She was on AutoPap therapy at the time. She had not been able to use her machine.  She was using oxygen at night but was not using oxygen when she was using her AutoPap machine. She was advised to return for sleep study testing. She had a split-night sleep study on 01/29/2018. I went over her test results with her in detail today. Her baseline sleep latency was 10.5 minutes, REM sleep was absent during the baseline portion of the study, sleep efficiency was 61%. She had a total AHI of 79.5 per hour. Average  oxygen saturation was 86%, nadir was 65%. She was placed on oxygen at 10:14 PM due to repeated desaturations, 2 L/m. She was then fitted with a medium fullface mask and CPAP was titrated from 5 cm to 14 cm but she had ongoing sleep disordered breathing and ongoing lower oxygen saturations for which she was on supplemental oxygen. She was switched to BiPAP therapy of 15/11 and further titrated to 20/16 cm. Oxygen was added at 1:16 AM. On the final setting of 20/16 cm with oxygen at 2 L/m bleed in her AHI was 0 per hour and O2 nadir was 88%. Sleep efficiency during the treatment portion of the study was 94.7%. REM sleep percentage was increased at 36.7% in keeping with rebound. Her EKG during the study showed PACs. Based on her sleep study results I prescribed home treatment with standard BiPAP therapy and 20/16 with supplemental oxygen bleed in, at 2 L/m.   I reviewed her BiPAP compliance data from 03/22/2018 through 04/20/2018 which is a total of 30 days, during which time she used her machine every night with percent used days greater than 4 hours at 93%, indicating excellent compliance with an average usage of 6 hours and 14 minutes, residual AHI at goal at 1.2 per hour, leak on the high side with the 95th percentile at 27.7 L per minute on a pressure of 20/16.     12/30/2017: (She) was previously diagnosed with obstructive sleep apnea and placed on CPAP therapy. Prior sleep study results were reviewed today from 02/21/2008. Study was interpreted by Dr. Annamaria Boots. She had a prior baseline sleep study in August 2009. She had a CPAP titration study was titrated to a pressure of 18 cm and a treatment pressure of 15 cm was recommended. I reviewed her CPAP compliance data from 09/09/2017 through 12/07/2017 which is a total of 90 days, during which time she used her machine 63 days with percent used days greater than 4 hours at 42% only, average usage of 4 hours and 30 minutes, residual AHI 2.8 per hour, 95th  percentile of pressure at 9.5 cm, she is on AutoPap and leak is high. She has not used her machine since approximately 11/22/2017. She reports that she has a problem with cockroaches at home and is getting help for this. (She had unfortunately cockroaches around the PAP machine and in the bag. We had to reschedule her appointment from 12/08/17). I reviewed your office note from 10/07/17. Her Epworth sleepiness score is 15 out of 24, fatigue score is 39/63. Her DME company is [facility]. She is single and lives alone, she has two children. 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 She is currently using oxygen 2 L/m during the day but not at night when she uses her CPAP. She has stopped using her CPAP as she no longer has supplies. She did get a new CPAP machine about 2-3 months ago from her DME company. She denies morning headaches,  has nocturia about 2-3 times per average night, typically in bed by 8 but watches TV while in bed and does not follow asleep until 11 or midnight. Rise time is around 7. When she was able to use her CPAP consistently she felt improved as far as her daytime energy and sleep quality. She has been told recently by her son and her sister that she has pauses in her breathing while asleep. She has been on supplemental oxygen during the day since October 2019. She has recently been using a small fullface mask but feels that she needs a medium size. Her air leak from the mask is indeed quite high.  Her Past Medical History Is Significant For: Past Medical History:  Diagnosis Date  . Anxiety   . Arthritis 04-10-11   hips, shoulders, back  . Asthma   . Bipolar disorder ([place])   . Cancer ([place]) 1993   cervical, no treatment done, went away per pt  . Cervical dysplasia or atypia 04-10-11   '93- once dx.-got pregnant-no intervention, then postpartum, no dysplasia found  . CHF (congestive heart  failure) ([place])    no cardiologist 2014 dx, none now  . Chronic kidney disease    stage 3 kidney disease  . 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 Pathology showed Condyloma, gluteal CONDYLOMA ACUMINATUM.   [name] Kitchen COPD (chronic obstructive pulmonary disease) ([place])   . Diabetes mellitus without complication ([place])   . Dyspnea    with actity, sitting  . Fall 12/16/2016  . Fibrocystic breast disease   . GERD (gastroesophageal reflux disease)   . Hepatitis    hep B-count is low at present,doesn't register  . Hypercalcemia   . Hyperlipidemia   . Hypertension   . Lung nodule 11/2017   right and left lung  . Migraine headache    none recent  . Morbid obesity ([place]) 03/27/2006  . Neuropathy   . Pneumonia    walking pneu 15 yrs. ago  . Skin lesion 03/15/2011   In gluteal crease now s/p removal by Dr. [name] of General Surgery on 3/12. Path shows condyloma.     . Sleep apnea 04-10-11   uses cpap, pt does not know settings  . Sleep apnea   . Trigger finger    left third    Her Past Surgical History Is Significant For: Past Surgical History:  Procedure Laterality Date  . ANAL FISTULECTOMY  04/17/2011   Procedure: FISTULECTOMY ANAL;  Surgeon: Imogene Burn. [name], MD;  Location: [facility];  Service: General;  Laterality: N/A;  Excision of Condyloma Gluteal Cleft   . BACK SURGERY  04-10-11   x5-Lumbar fusion-retained hardware.(Dr. Louanne Skye)  . BREAST CYST EXCISION     bilateral breast, 3 cysts removed from each breast  . BREAST EXCISIONAL BIOPSY Right    x 3  . BREAST EXCISIONAL BIOPSY Left    x 3  . CARPAL TUNNEL RELEASE Bilateral   . Cervical biospy    . COLONOSCOPY WITH PROPOFOL N/A 07/06/2015   Procedure: COLONOSCOPY WITH PROPOFOL;  Surgeon: [name], MD;  Location: [facility];  Service: Endoscopy;  Laterality: N/A;  . DOPPLER ECHOCARDIOGRAPHY  04/06/2012   AT [place] 55-60%  . FRACTURE SURGERY    . I&D EXTREMITY Left 12/18/2016   Procedure: IRRIGATION AND  DEBRIDEMENT LEFT FOOT, CLOSURE;  Surgeon: [name], MD;  Location: [facility];  Service: Orthopedics;  Laterality: Left;  [name] Kitchen MULTIPLE TOOTH EXTRACTIONS    .  NECK SURGERY  04-10-11   x3- cervical fusion with plating and screws-Dr. [name]  . TEE WITHOUT CARDIOVERSION N/A 04/06/2012   Procedure: TRANSESOPHAGEAL ECHOCARDIOGRAM (TEE);  Surgeon: [name], MD;  Location: [facility] ENDOSCOPY;  Service: Cardiovascular;  Laterality: N/A;  . TRIGGER FINGER RELEASE Right    middle finger  . TRIGGER FINGER RELEASE Left 06/28/2016   Procedure: RELEASE TRIGGER FINGER LEFT 3RD FINGER;  Surgeon: [name], MD;  Location: [facility];  Service: Orthopedics;  Laterality: Left;  . TRIGGER FINGER RELEASE Right 06/12/2016   Procedure: RIGHT INDEX FINGER TRIGGER RELEASE;  Surgeon: [name], MD;  Location: [facility];  Service: Orthopedics;  Laterality: Right;  . TRIGGER FINGER RELEASE Right 01/19/2018   Procedure: RIGHT RING FINGER TRIGGER FINGER RELEASE;  Surgeon: [name], MD;  Location: [facility];  Service: Orthopedics;  Laterality: Right;    Her Family History Is Significant For: Family History  Problem Relation Age of Onset  . Pulmonary embolism Mother   . Stroke Father   . Alcohol abuse Father   . Pneumonia Father   . Cancer Maternal Aunt        breast  . Breast cancer Maternal Aunt   . Asthma Other   . COPD Other   . Hypertension Other   . Diabetes Other   . Asthma Sister   . Depression Sister   . Arthritis Sister   . Sickle cell anemia Son     Her Social History Is Significant For: Social History   Socioeconomic History  . Marital status: Single    Spouse name: Not on file  . Number of children: 1  . Years of education: 24  . Highest education level: 12th grade  Occupational History    Comment: Disabled  Social Needs  . Financial resource strain: Not hard at all  . Food insecurity    Worry: Never true    Inability: Never true  . Transportation needs    Medical: No    Non-medical: No   Tobacco Use  . Smoking status: Current Every Day Smoker    Packs/day: 0.25    Years: 40.00    Pack years: 10.00    Types: Cigarettes    Start date: 01/28/1977    Last attempt to quit: 10/19/2018    Years since quitting: 0.1  . Smokeless tobacco: Never Used  . Tobacco comment: Quit 8 months 2019 - restart 05/2018  Substance and Sexual Activity  . Alcohol use: No    Alcohol/week: 0.0 standard drinks  . Drug use: Not Currently    Types: Marijuana, "Crack" cocaine    Comment: hx marijuana usemany years ago, Crack -none since 11/2015  . Sexual activity: Not Currently    Partners: Male    Birth control/protection: Post-menopausal  Lifestyle  . Physical activity    Days per week: 0 days    Minutes per session: 0 min  . Stress: Not at all  Relationships  . Social connections    Talks on phone: More than three times a week    Gets together: Twice a week    Attends religious service: 1 to 4 times per year    Active member of club or organization: No    Attends meetings of clubs or organizations: Never    Relationship status: Divorced  Other Topics Concern  . Not on file  Social History Narrative   Current Social History       Who lives at home: Patient lives  alone in one level home; has 4 steps onto porch with a handrail. Has smoke alarms, no throw rugs. Has elevated toilet. Has shower bench in tub with grab rails.    Transportation: Patient has own vehicle and drives herself    Important Relationships "Family and friends"    Pets: None    Likes to eat varied diet. Seafood, fish, roasted [place] breast, vegetables and salads and fruits.   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 I'm a lovable person."                                                                                                     Her Allergies Are:  Allergies  Allergen Reactions  . Aspirin Other (See Comments)    Kidney disease  . Chantix [Varenicline Tartrate] Other (See Comments)    On the 1 mg dose, patient experienced hallucintation  . 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 Corticosteroids Other (See Comments)    Hyperglycemia   . Levofloxacin Itching  :   Her Current Medications Are:  Outpatient Encounter Medications as of 11/30/2018  Medication Sig  . ACCU-CHEK SOFTCLIX LANCETS lancets TEST 4 TIMES DAILY  . Acetaminophen (TYLENOL EXTRA STRENGTH PO) Take 1,000 mg by mouth 2 (two) times a day.  [name] Kitchen ammonium lactate (AMLACTIN) 12 % lotion Apply 1 application topically 2 (two) times daily as needed for dry skin.  [name] Kitchen atorvastatin (LIPITOR) 40 MG tablet Take 1 tablet (40 mg total) by mouth daily.  [name] Kitchen b complex vitamins capsule Take 1 capsule by mouth daily.  . baclofen (LIORESAL) 10 MG tablet Take 1 tablet (10 mg total) by mouth 3 (three) times daily as needed for muscle spasms. (Patient taking differently: Take 10 mg by mouth 3 (three) times daily. )  . blood glucose meter kit and supplies KIT Dispense based on patient and insurance preference. Use up to four times daily as directed. (FOR ICD-9 250.00, 250.01).  . busPIRone (BUSPAR) 10 MG tablet TAKE 1 TABLET BY MOUTH THREE TIMES A DAY (Patient taking differently: Take 10 mg by mouth 3 (three) times daily. )  . Calcium Citrate-Vitamin D (CALCIUM CITRATE + D3 PO) Take 1 tablet by mouth daily.  . Capsaicin-Menthol (PAIN RELIEF EX) Apply 1 application topically 4 (four) times daily as needed (shoulder pain).   . CHANTIX 0.5 MG tablet TAKE 1 TABLET BY MOUTH 2 TIMES DAILY.  [name] Kitchen diclofenac sodium (VOLTAREN) 1 % GEL APPLY 2 GRAMS TO AFFECTED AREA 4 TIMES A DAY (Patient taking differently: Apply 4 g topically 4 (four) times daily. Per pt: applies all over body)  . diltiazem  (CARDIZEM CD) 120 MG 24 hr capsule Take 1 capsule (120 mg  total) by mouth 2 (two) times daily.  [name] Kitchen docusate sodium (COLACE) 100 MG capsule Take 200 mg by mouth 2 (two) times daily.  . empagliflozin (JARDIANCE) 25 MG TABS tablet Take 25 mg by mouth daily.  . febuxostat (ULORIC) 40 MG tablet Take 40 mg by mouth at bedtime.   . Flaxseed, Linseed, (FLAXSEED OIL) 1000 MG CAPS Take 1,000 mg by mouth daily.  . fluticasone (FLONASE) 50 MCG/ACT nasal spray Place 2 sprays into both nostrils 2 (two) times daily.  . Fluticasone-Umeclidin-Vilant (TRELEGY ELLIPTA) 100-62.5-25 MCG/INH AEPB Inhale 1 puff into the lungs daily.  . folic acid (FOLVITE) 027 MCG tablet Take 400 mcg by mouth daily.  . furosemide (LASIX) 40 MG tablet Take 2 tablets (80 mg total) by mouth 2 (two) times daily.  [name] Kitchen gabapentin (NEURONTIN) 300 MG capsule TAKE 2 CAPSULES (600 MG TOTAL) BY MOUTH 3 (THREE) TIMES DAILY.  [name] Kitchen glucose blood (ACCU-CHEK AVIVA PLUS) test strip TEST 4 TIMES DAILY (Patient taking differently: 1 each by Other route 3 (three) times daily. TEST 3 TIMES DAILY)  . HYDROcodone-acetaminophen (NORCO) 7.5-325 MG tablet Take 1 tablet by mouth every 6 (six) hours as needed for moderate pain.  . hydrOXYzine (ATARAX/VISTARIL) 10 MG tablet TAKE 1 TABLET BY MOUTH EVERY DAY AS NEEDED (Patient taking differently: Take 10 mg by mouth daily. )  . Insulin Glargine (LANTUS SOLOSTAR) 100 UNIT/ML Solostar Pen Inject 22 Units into the skin daily before breakfast. (Patient taking differently: Inject 20 Units into the skin daily before breakfast. )  . insulin lispro (HUMALOG KWIKPEN) 100 UNIT/ML KwikPen Inject 0.06 mLs (6 Units total) into the skin 3 (three) times daily before meals.  . Insulin Pen Needle (B-D UF III MINI PEN NEEDLES) 31G X 5 MM MISC CHECK SUGARS 4 TIMES A DAY BEFORE MEALS AND AT BEDTIME.  [name] Kitchen ketoconazole (NIZORAL) 2 % cream APPLY 1 FINGERTIP AMOUNT TO EACH FOOT DAILY.  [name] Kitchen loratadine (CLARITIN) 10 MG tablet Take 1 tablet (10 mg total)  by mouth daily.  [name] Kitchen losartan (COZAAR) 100 MG tablet Take 1 tablet (100 mg total) by mouth daily.  . metolazone (ZAROXOLYN) 2.5 MG tablet Take 1 tablet (2.5 mg total) by mouth 2 (two) times a week. TAKE 1 TABLET TWICE WEEKLY ON MONDAYS AND THURSDAYS ONLY  . montelukast (SINGULAIR) 10 MG tablet TAKE 1 TABLET BY MOUTH EVERYDAY AT BEDTIME (Patient taking differently: Take 10 mg by mouth every morning. )  . Multiple Vitamins-Minerals (MULTIVITAMIN WITH MINERALS) tablet Take 1 tablet by mouth daily.  . mupirocin ointment (BACTROBAN) 2 % APPLY TO AFFECTED AREA TWICE A DAY (Patient taking differently: Apply 1 application topically 2 (two) times daily. )  . NARCAN 4 MG/0.1ML LIQD nasal spray kit Place 1 spray into the nose once.   . NON FORMULARY Uses a C-PAP at bedtime  . nystatin (MYCOSTATIN) 100000 UNIT/ML suspension Take 5 mLs (500,000 Units total) by mouth 4 (four) times daily. Swish and swallow.  . Omega-3 Fatty Acids (FISH OIL) 1000 MG CAPS Take 1,000 mg by mouth daily.   [name] Kitchen omeprazole (PRILOSEC) 20 MG capsule Take 1 capsule (20 mg total) by mouth daily as needed. (Patient taking differently: Take 20 mg by mouth daily. )  . potassium chloride SA (KLOR-CON M20) 20 MEQ tablet Take 1 tablet (20 mEq total) by mouth 2 (two) times a week. Take 1 tablet twice weekly on Monday and Thursday.  [name] Kitchen PROAIR HFA 108 (90 Base) MCG/ACT inhaler INHALE 2 PUFFS INTO THE LUNGS EVERY 6 (SIX) HOURS  AS NEEDED FOR SHORTNESS OF BREATH.  . promethazine (PHENERGAN) 25 MG tablet Take 1 tablet (25 mg total) by mouth every 6 (six) hours as needed for nausea.  . Semaglutide,0.25 or 0.5MG/DOS, (OZEMPIC, 0.25 OR 0.5 MG/DOSE,) 2 MG/1.5ML SOPN Inject 0.25 mg into the skin once a week. Inject 0.25 mg into the skin once weekly on Thursdays.  [name] Kitchen sertraline (ZOLOFT) 50 MG tablet TAKE 1 TABLET BY MOUTH EVERY DAY  . TOVIAZ 8 MG TB24 tablet Take 8 mg by mouth daily.  . [DISCONTINUED] CHANTIX 0.5 MG tablet TAKE 1 TABLET (0.5 MG TOTAL) BY MOUTH 2  (TWO) TIMES DAILY.   Facility-Administered Encounter Medications as of 11/30/2018  Medication  . technetium tetrofosmin (TC-MYOVIEW) injection 46.5 millicurie  :  Review of Systems:  Out of a complete 14 point review of systems, all are reviewed and negative with the exception of these symptoms as listed below:      Review of Systems  Constitutional: Negative for unexpected weight change.  Neurological:       Pt presents today to discuss pain behind her left ear. Pt is also complaining of shoulder pain. Pt reports that pt has not used her bipap because she needs an order for a new mask.    Objective:  Neurological Exam  Physical Exam Physical Examination:   Vitals:   11/30/18 1515  BP: (!) 149/86  Pulse: 97  Temp: (!) 97.5 F (36.4 C)    General Examination: The patient is a very pleasant 53 y.o. female in no acute distress. She appears well-developed and well-nourished and adequately groomed.   HEENT: Normocephalic, atraumatic, pupils are equal, round and reactive to light and accommodation. Extraocular tracking is good without limitation to gaze excursion or nystagmus noted. Normal smooth pursuit is noted. Hearing is grossly intact. Face is symmetric with normal facial animation and normal facial sensation. Speech is clear with no dysarthria noted. There is no hypophonia. There is no lip, neck/head, jaw or voice tremor. Neck is supple with Some decrease in side to side turns, shoulder shrug is equal, reports pain in the right shoulder. There are no carotid bruits on auscultation. Oropharynx exam reveals: moderate to severe mouth dryness, adequate dental hygiene with dentures on top and only a few front teeth left on the bottom, she has a partial denture but does not wear it currently. She has moderate airway crowding secondary to redundant soft palate and tonsils in place of about 1+. Mallampati is class II. Tongue protrudes centrally and palate elevates symmetrically. Neck  circumference is 18-5/8 inches.  Chest: Clear to auscultation without wheezing, rhonchi or crackles noted.  Heart: S1+S2+0, regular and normal without murmurs, rubs or gallops noted.   Abdomen: Soft, non-tender and non-distended with normal bowel sounds appreciated on auscultation.  Extremities: There is trace pitting edema in the distal lower extremities bilaterally.   Skin: Warm and dry without trophic changes noted.  Musculoskeletal: exam reveals Pain and decreased range of motion in right shoulder.Left knee pain.   Neurologically:  Mental status: The patient is awake, alert and oriented in all 4 spheres. Her immediate and remote memory, attention, language skills and fund of knowledge are appropriate. There is no evidence of aphasia, agnosia, apraxia or anomia. Speech is clear with normal prosody and enunciation. Thought process is linear. Mood is normal and affect is normal.  Cranial nerves II - XII are as described above under HEENT exam. In addition: shoulder shrug is normal with equal shoulder height noted. Motor exam: Normal  bulk, strength and tone is noted. There is no tremor. Fine motor skills and coordination: grossly intact.  Cerebellar testing: No dysmetria or intention tremor on finger to nose testing. Heel to shin is unremarkable bilaterally. There is no truncal or gait ataxia.  Sensory exam: intact to light touch in the upper and lower extremities.  Gait, station and balance: She stands with difficulty and stands slightly wide-based. She walks without a walking aid. Mild limp on the left.  Assessment and Plan:    In summary, LUGENIA ASSEFA is a very pleasant 52 year old female with an underlying medical complex medical history of COPD, congestive heart failure with oxygen dependence, urinary incontinence, diabetes, neuropathy, hypertension, hyperlipidemia, status post multiple surgeries, and morbid obesity with BMI of over 55, who presents for evaluation of her  psterior neck pain and right shoulder pain.  She has recently had a shoulder MRI which showed some degenerative changes and tendon tears. No significant muscular spasms, she has a follow-up to discuss the possibility of shoulder surgery.  She has had neck surgeries.  She has residual neck pain.  She had undergone epidural neck injections and had side effects on steroids on more than one occasion, particularly with significant increase in blood sugar values per her report.  I do not believe she is a candidate for Botox injections for neuralgic pain.  I would recommend she talk to her orthopedic doctor about potentially seeing a pain management doctor, also the possibility of seeing Dr. Maryjean Ka for symptomatic management of her neck pain.  She has lumbar spinal stenosis as well.She is already on pain medication and high-dose gabapentin.  Unfortunately, I do not believe that Botox is indicated for her shoulder pain or neck pain.She does not have evidence of cervical dystonia.I ordered a new mask for her BiPAP machine.  She is advised to keep her sleep apnea appointment in March with the nurse practitioner.  She is advised to keep her upcoming primary care physician appointment and orthopedic appointments. I answered all her questions today and the patient was in agreement.

## 2018-11-30 NOTE — Patient Instructions (Signed)
I will write for a new mask for your BiPAP machine.  You have been compliant previously with your BiPAP, please keep your appointment with [facility] scheduled for March next year. As discussed, I do not believe you are a candidate for Botox injection in the shoulder muscles or in the neck muscles currently.  You already are on pain medication and high-dose gabapentin.  Please talk to Dr. Louanne Skye about potentially seeing Dr. Maryjean Ka for pain management.

## 2018-12-01 ENCOUNTER — Other Ambulatory Visit: Payer: Self-pay | Admitting: Family Medicine

## 2018-12-01 ENCOUNTER — Ambulatory Visit (INDEPENDENT_AMBULATORY_CARE_PROVIDER_SITE_OTHER): Payer: Medicare Other | Admitting: Family Medicine

## 2018-12-01 ENCOUNTER — Other Ambulatory Visit (HOSPITAL_COMMUNITY)
Admission: RE | Admit: 2018-12-01 | Discharge: 2018-12-01 | Disposition: A | Discharge status: Home / Self Care | Payer: Medicare Other | Source: Ambulatory Visit | Attending: Family Medicine | Admitting: Family Medicine

## 2018-12-01 ENCOUNTER — Other Ambulatory Visit: Payer: Self-pay

## 2018-12-01 ENCOUNTER — Encounter: Payer: Self-pay | Admitting: Family Medicine

## 2018-12-01 VITALS — BP 130/70 | HR 96 | Wt 372.5 lb

## 2018-12-01 DIAGNOSIS — Z1151 Encounter for screening for human papillomavirus (HPV): Secondary | ICD-10-CM | POA: Insufficient documentation

## 2018-12-01 DIAGNOSIS — Z0000 Encounter for general adult medical examination without abnormal findings: Secondary | ICD-10-CM

## 2018-12-01 DIAGNOSIS — N3941 Urge incontinence: Secondary | ICD-10-CM | POA: Diagnosis not present

## 2018-12-01 DIAGNOSIS — Z01419 Encounter for gynecological examination (general) (routine) without abnormal findings: Secondary | ICD-10-CM | POA: Diagnosis present

## 2018-12-01 DIAGNOSIS — F418 Other specified anxiety disorders: Secondary | ICD-10-CM | POA: Diagnosis not present

## 2018-12-01 DIAGNOSIS — M75101 Unspecified rotator cuff tear or rupture of right shoulder, not specified as traumatic: Secondary | ICD-10-CM | POA: Diagnosis not present

## 2018-12-01 DIAGNOSIS — F320 Major depressive disorder, single episode, mild: Secondary | ICD-10-CM

## 2018-12-01 MED ORDER — SERTRALINE HCL 25 MG PO TABS: 25.0000 mg | ORAL_TABLET | Freq: Every day | ORAL | 0 refills | Status: DC

## 2018-12-01 NOTE — Progress Notes (Signed)
    Subjective:  [name] is a 53 y.o. female who presents to the [facility] today for her routine Pap smear.  HPI: Patient reports she is doing well.  Has questions about other issues today regarding her incontinence.  Asking for incontinence pads because the ones that she bought are not sufficient.  She also reports that she injured her shoulder and was seen by an orthopedist.  She had an MRI on Monday which showed a rotator cuff tear.  A follow-up appointment with the orthopedist in the next week or so to discuss surgery.   Depression We also discussed her depression and her appointment with Dr. Hartford Poli.  She reports that since seeing her she has been doing her at home CBT with mild improvement.  She reports she listens to music and plays games on her phone to relieve stress.  She tells me that she was previously on a higher dose of Zoloft which seemed to help more.  She is currently on 50 mg of Zoloft daily.  Objective:  Physical Exam: BP 130/70   Pulse 96   Wt (!) 372 lb 8 oz (169 kg)   LMP 04/10/2006   SpO2 96%   BMI 53.45 kg/m   Gen: NAD, resting comfortably CV: RRR with no murmurs appreciated Pulm: NWOB on 2 L of O2 GI: Normal bowel sounds present. Soft, Nontender, Nondistended Pelvic: External exam of vulva and introitus normal, nonerythematous no lesions.  Cervix is closed, nontender, no visible abnormality, Pap taken Skin: warm, dry Neuro: grossly normal, moves all extremities Psych: Normal affect and thought content  No results found. However, due to the size of the patient record, not all encounters were searched. Please check Results Review for a complete set of results.   Assessment/Plan:  Urge incontinence of urine Discussed patient's urge incontinence with her.  She requests prescription for incontinence pad.  She says that she is spoken with urologist and needs Botox injections and is planning on getting them but will need the pads to tide her over until that time.  -DME prescription given for incontinence pads  Rotator cuff tear, right Patient is currently seeing an orthopedist for this injury.MRI showed small full-thickness insertional tear of the supraspinatus anteriorly and partial insertional tearing of the infraspinatus tendon.  Patient is scheduled for follow-up appointment with her orthopedist to discuss treatment options. -We will follow up on future visits  Depression with anxiety Patient recently saw Dr. Hartford Poli reports little relief on current dose of sertraline.  She was not completing her at home CBT but since her visit has been.  She notices minor improvement symptoms.  Discussed increasing dose of sertraline. -Increase dose to 75 mg sertraline daily for 1 month. -After 1 month I will increase the dose to 100 mg daily -Follow-up with either me or Dr. Hartford Poli in 6 weeks to discuss symptom improvement   Lab Orders  No laboratory test(s) ordered today    Meds ordered this encounter  Medications  . sertraline (ZOLOFT) 25 MG tablet    Sig: Take 1 tablet (25 mg total) by mouth daily.    Dispense:  30 tablet    Refill:  0      [name], MD  PGY-1, [facility]  12/01/18 9:41 AM

## 2018-12-01 NOTE — Assessment & Plan Note (Signed)
Patient is currently seeing an orthopedist for this injury.MRI showed small full-thickness insertional tear of the supraspinatus anteriorly and partial insertional tearing of the infraspinatus tendon.  Patient is scheduled for follow-up appointment with her orthopedist to discuss treatment options. -We will follow up on future visits

## 2018-12-01 NOTE — Assessment & Plan Note (Signed)
Discussed patient's urge incontinence with her.  She requests prescription for incontinence pad.  She says that she is spoken with urologist and needs Botox injections and is planning on getting them but will need the pads to tide her over until that time. -DME prescription given for incontinence pads

## 2018-12-01 NOTE — Patient Instructions (Signed)
It was wonderful to see you today [name].  Today we did your routine Pap smear with cytology.  Those results will come to your MyChart.  I will let you know if there is anything abnormal.  If you do not hear anything please assume that your results are completely normal.  We also discussed your depression today.  I am going to increase your Zoloft dose to 75 mg for 1 month and then up to 100 mg after that.  I have sent a prescription for 25 mg Zoloft to your pharmacy please pick them up and take them until you run out of your current prescription.  In 1 month I will discontinue your current dose and increasing to 100 mg a day which will be just 1 pill.  I would like you to follow-up with Dr. Hartford Poli in 6 weeks to discuss how you are doing.  I will also be working on your disability forms and be watching for your orthopedist recommendations on how to best deal with your shoulder injury.

## 2018-12-01 NOTE — Assessment & Plan Note (Signed)
Patient recently saw Dr. Hartford Poli reports little relief on current dose of sertraline.  She was not completing her at home CBT but since her visit has been.  She notices minor improvement symptoms.  Discussed increasing dose of sertraline. -Increase dose to 75 mg sertraline daily for 1 month. -After 1 month I will increase the dose to 100 mg daily -Follow-up with either me or Dr. Hartford Poli in 6 weeks to discuss symptom improvement

## 2018-12-02 ENCOUNTER — Encounter: Payer: Self-pay | Admitting: Specialist

## 2018-12-02 ENCOUNTER — Ambulatory Visit (INDEPENDENT_AMBULATORY_CARE_PROVIDER_SITE_OTHER): Payer: Medicare Other | Admitting: Specialist

## 2018-12-02 VITALS — BP 126/76 | HR 94 | Ht 70.0 in | Wt 373.0 lb

## 2018-12-02 DIAGNOSIS — M25511 Pain in right shoulder: Secondary | ICD-10-CM

## 2018-12-02 DIAGNOSIS — M75101 Unspecified rotator cuff tear or rupture of right shoulder, not specified as traumatic: Secondary | ICD-10-CM | POA: Diagnosis not present

## 2018-12-02 DIAGNOSIS — M25512 Pain in left shoulder: Secondary | ICD-10-CM

## 2018-12-02 DIAGNOSIS — Z79899 Other long term (current) drug therapy: Secondary | ICD-10-CM | POA: Diagnosis not present

## 2018-12-02 DIAGNOSIS — G894 Chronic pain syndrome: Secondary | ICD-10-CM | POA: Diagnosis not present

## 2018-12-02 NOTE — Patient Instructions (Signed)
Avoid overhead lifting and overhead use of the arms. Do not lift greater than 5 lbs. Adjust head rest in vehicle to prevent hyperextension if rear ended. Take extra precautions to avoid falling. Avoid overhead lifting and overhead use of the arms. Do not lift greater than 5 lbs. Tylenol ES one every 6-8 hours for pain and inflamation.  A home exercise program. Obain MRI of the left shoulder. Referral to Dr. Erlinda Hong for consideration of shoulder arthroscopy and right shoulder rotator cuff repair and evaluation of the left shoulder for similar solution. Failed conservative therapy and PT. No surgical solution for the cervical spine needed.

## 2018-12-02 NOTE — Progress Notes (Signed)
Office Visit Note   Patient: [name]           Date of Birth: January 29, 1966           MRN: [id number] Visit Date: 12/02/2018              Requested by: [name], MD [address],  [place] [address] PCP: [name], MD   Assessment & Plan: Visit Diagnoses:  1. Nontraumatic tear of right rotator cuff, unspecified tear extent   2. Left shoulder pain, unspecified chronicity   3. Acute pain of both shoulders     Plan: Avoid overhead lifting and overhead use of the arms. Do not lift greater than 5 lbs. Adjust head rest in vehicle to prevent hyperextension if rear ended. Take extra precautions to avoid falling. Avoid overhead lifting and overhead use of the arms. Do not lift greater than 5 lbs. Tylenol ES one every 6-8 hours for pain and inflamation.  A home exercise program. Obain MRI of the left shoulder. Referral to Dr. Erlinda Hong for consideration of shoulder arthroscopy and right shoulder rotator cuff repair and evaluation of the left shoulder for similar solution. Failed conservative therapy and PT. No surgical solution for the cervical spine needed.   A home exercise program. Obain MRI of the left shoulder. Referral to Dr. Erlinda Hong for consideration of shoulder arthroscopy and right shoulder rotator cuff repair and evaluation of the left shoulder for similar solution. Failed conservative therapy and PT. No surgical solution for the cervical spine needed.   Follow-Up Instructions: Return in about 3 weeks (around 12/23/2018), or Schedule appt with Dr. Erlinda Hong to be evaluated for right shouder rotator cuff tear repair with SAD, for Se me in 3 months for follow up..   Orders:  Orders Placed This Encounter  Procedures  . MR Shoulder Left w/o contrast   No orders of the defined types were placed in this encounter.     Procedures: No procedures performed   Clinical Data: Findings:  CLINICAL DATA:  Right shoulder pain with limited range of motion for 5 months.  No acute injury or prior relevant surgery.  EXAM: MRI OF THE RIGHT SHOULDER WITHOUT CONTRAST  TECHNIQUE: Multiplanar, multisequence MR imaging of the shoulder was performed. No intravenous contrast was administered.  COMPARISON:  Radiographs 11/04/2018.  FINDINGS: Despite efforts by the technologist and patient, mild motion artifact is present on today's exam and could not be eliminated. This reduces exam sensitivity and specificity.  Rotator cuff: Supraspinatus tendinosis with a small full-thickness insertional tear anteriorly. This tear is best seen on the coronal and sagittal T2 weighted images. There is no significant tendon retraction. There is partial insertional tearing of the infraspinatus tendon. The subscapularis and teres minor tendons appear intact.  Muscles:  No focal muscular atrophy or edema.  Biceps long head: Intact and normally positioned. Mild tendinosis of the intra-articular portion.  Acromioclavicular Joint: The acromion is type 2. There are mild acromioclavicular degenerative changes. A small amount of fluid is present in the subacromial-subdeltoid bursa.  Glenohumeral Joint: No significant shoulder joint effusion or glenohumeral arthropathy.  Labrum: Labral assessment is limited by the lack of joint fluid and motion. No evidence of labral tear or paralabral cyst.  Bones: No acute or significant extra-articular osseous findings.  Other: No significant soft tissue findings.  IMPRESSION: 1. Small full-thickness insertional tear of the supraspinatus tendon anteriorly. No significant tendon retraction or muscular atrophy. 2. Partial insertional tearing of the infraspinatus tendon. 3. Mild  bicipital tendinosis. 4. No evidence of labral or biceps tendon tear.   Electronically Signed   By: Richardean Sale M.D.   On: 11/23/2018 10:11     Subjective: Chief Complaint  Patient presents with  . Neck - Follow-up  . 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 She has  Been referred to Dr. Maryjean Ka, is already seeing a rheumatologist Dr. Veleta Miners and pain management [facility] Jeanella Anton NP. 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 Her shoulders are constantly painful and she has pain With overhead use of the arms and overhead lifting. PT has been attempted without relief of pain. She underwent the recent right shoulder MRI and the results show a Right shoulder full thickness RCT with impingment. She has similar left shoulder pain.   Review of Systems  Constitutional: Negative.   HENT: Negative.   Eyes: Negative.   Respiratory: Negative.   Cardiovascular: Negative.   Gastrointestinal: Negative.   Endocrine: Negative.   Genitourinary: Negative.   Musculoskeletal: Negative.   Skin: Negative.   Allergic/Immunologic: Negative.   Neurological: Negative.   Hematological: Negative.   Psychiatric/Behavioral: Negative.      Objective: Vital Signs: BP 126/76 (BP Location: Left Arm, Patient Position: Sitting)   Pulse 94   Ht 5\' 10"  (1.778 m)   Wt (!) 373 lb (169.2 kg)   LMP 04/10/2006   BMI 53.52 kg/m   Physical Exam Constitutional:      Appearance: She is well-developed.  HENT:     Head: Normocephalic and atraumatic.  Eyes:     Pupils: Pupils are equal, round, and reactive to light.  Neck:     Musculoskeletal: Normal range of motion and neck supple.  Pulmonary:     Effort: Pulmonary effort is normal.     Breath sounds: Normal breath sounds.  Abdominal:     General: Bowel sounds are normal.     Palpations: Abdomen is soft.  Skin:    General: Skin is warm and dry.  Neurological:     Mental Status: She is alert and oriented to person, place, and time.  Psychiatric:        Behavior: Behavior normal.        Thought Content: Thought content normal.         Judgment: Judgment normal.     Right Shoulder Exam   Tenderness  The patient is experiencing tenderness in the acromion.  Range of Motion  Active abduction: abnormal  Passive abduction: abnormal  Extension: abnormal  External rotation: abnormal  Forward flexion: abnormal  Internal rotation 0 degrees:  T8 abnormal  Internal rotation 90 degrees: 50   Muscle Strength  Abduction: 4/5  Internal rotation: 5/5  External rotation: 4/5  Supraspinatus: 4/5  Subscapularis: 5/5  Biceps: 5/5   Tests  Apprehension: positive Impingement: positive Drop arm: positive  Other  Erythema: absent Scars: absent Sensation: normal Pulse: present   Left Shoulder Exam   Tenderness  The patient is experiencing tenderness in the acromion.  Range of Motion  Active abduction: abnormal  Passive abduction: abnormal  Extension: abnormal  External rotation: abnormal  Forward flexion: abnormal  Internal rotation 0 degrees: T8  Internal rotation 90 degrees: 60   Muscle Strength  Abduction: 4/5  Internal rotation: 5/5  External rotation: 4/5  Supraspinatus: 4/5  Subscapularis: 5/5  Biceps: 5/5  Tests  Apprehension: positive Impingement: positive Drop arm: positive  Other  Erythema: absent Scars: absent Sensation: normal Pulse: present       Specialty Comments:  No specialty comments available.  Imaging: No results found.   PMFS History: Patient Active Problem List   Diagnosis Date Noted  . Rotator cuff tear, right 12/01/2018  . Chest pain 09/24/2018  . Right groin pain 07/20/2018  . Depressed mood 07/13/2018  . Mouth pain 06/02/2018  . Paroxysmal SVT (supraventricular tachycardia) ([place]) 03/20/2018  . Pure hypercholesterolemia 03/20/2018  . Muscle cramping 02/25/2018  . Nodule of upper lobe of right lung 02/13/2018  . Falls, subsequent encounter 01/30/2018  . Open wound of left foot 11/04/2017  . Essential hypertension 10/27/2017  . Solitary pulmonary  nodule 10/07/2017  . Restrictive lung disease secondary to obesity 10/01/2017  . Polycythemia, secondary 10/01/2017  . Chronic viral hepatitis B without delta-agent ([place]) 07/08/2017  . COPD (chronic obstructive pulmonary disease) with chronic bronchitis ([place]) 06/27/2017  . Chronic kidney disease (CKD), stage IV (severe) ([place]) 01/14/2017  . Type 2 diabetes mellitus with stage 3 chronic kidney disease, with long-term current use of insulin ([place]) 12/12/2016  . Urge incontinence of urine 12/05/2016  . Trigger index finger of left hand 07/15/2016  . Back pain 04/07/2014  . Morbid obesity ([place]) 10/02/2012  . Chronic diastolic CHF (congestive heart failure) ([place]) 04/07/2012  . GERD 01/26/2010  . Obstructive sleep apnea treated with BiPAP 02/03/2008  . FIBROCYSTIC BREAST DISEASE 10/28/2006  . Hyperlipidemia 03/27/2006  . Obesity hypoventilation syndrome ([place]) 03/27/2006  . Former tobacco use 03/27/2006  . Depression with anxiety 03/27/2006   Past Medical History:  Diagnosis Date  . Anxiety   . Arthritis 04-10-11   hips, shoulders, back  . Asthma   . Bipolar disorder ([place])   . Cancer ([place]) 1993   cervical, no treatment done, went away per pt  . Cervical dysplasia or atypia 04-10-11   '93- once dx.-got pregnant-no intervention, then postpartum, no dysplasia found  . CHF (congestive heart failure) ([place])    no cardiologist 2014 dx, none now  . Chronic kidney disease    stage 3 kidney disease  . 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 Pathology showed Condyloma, gluteal CONDYLOMA ACUMINATUM.   [name] Kitchen COPD (chronic obstructive pulmonary disease) ([place])   . Diabetes mellitus without complication ([place])   . Dyspnea    with actity, sitting  . Fall 12/16/2016  . Fibrocystic breast disease   . GERD (gastroesophageal reflux disease)   . Hepatitis    hep B-count is low at present,doesn't register  . Hypercalcemia   . Hyperlipidemia   . Hypertension   . Lung nodule 11/2017    right and left lung  . Migraine headache    none recent  . Morbid obesity ([place]) 03/27/2006  . Neuropathy   . Pneumonia    walking pneu 15 yrs. ago  . Skin lesion 03/15/2011   In gluteal crease now s/p removal by Dr. [name] of General Surgery on 3/12. Path shows condyloma.     . Sleep apnea 04-10-11   uses cpap, pt does not know settings  . Sleep apnea   . Trigger finger    left third    Family History  Problem Relation Age of Onset  . Pulmonary embolism Mother   . Stroke Father   . Alcohol abuse Father   . Pneumonia Father   . Cancer Maternal Aunt  breast  . Breast cancer Maternal Aunt   . Asthma Other   . COPD Other   . Hypertension Other   . Diabetes Other   . Asthma Sister   . Depression Sister   . Arthritis Sister   . Sickle cell anemia Son     Past Surgical History:  Procedure Laterality Date  . ANAL FISTULECTOMY  04/17/2011   Procedure: FISTULECTOMY ANAL;  Surgeon: Imogene Burn. [name], MD;  Location: [facility];  Service: General;  Laterality: N/A;  Excision of Condyloma Gluteal Cleft   . BACK SURGERY  04-10-11   x5-Lumbar fusion-retained hardware.(Dr. Louanne Skye)  . BREAST CYST EXCISION     bilateral breast, 3 cysts removed from each breast  . BREAST EXCISIONAL BIOPSY Right    x 3  . BREAST EXCISIONAL BIOPSY Left    x 3  . CARPAL TUNNEL RELEASE Bilateral   . Cervical biospy    . COLONOSCOPY WITH PROPOFOL N/A 07/06/2015   Procedure: COLONOSCOPY WITH PROPOFOL;  Surgeon: [name], MD;  Location: [facility];  Service: Endoscopy;  Laterality: N/A;  . DOPPLER ECHOCARDIOGRAPHY  04/06/2012   AT [place] 55-60%  . FRACTURE SURGERY    . I&D EXTREMITY Left 12/18/2016   Procedure: IRRIGATION AND DEBRIDEMENT LEFT FOOT, CLOSURE;  Surgeon: [name], MD;  Location: [facility];  Service: Orthopedics;  Laterality: Left;  [name] Kitchen MULTIPLE TOOTH EXTRACTIONS    . NECK SURGERY  04-10-11   x3- cervical fusion with plating and screws-Dr. [name]  . TEE WITHOUT CARDIOVERSION N/A  04/06/2012   Procedure: TRANSESOPHAGEAL ECHOCARDIOGRAM (TEE);  Surgeon: [name], MD;  Location: [facility];  Service: Cardiovascular;  Laterality: N/A;  . TRIGGER FINGER RELEASE Right    middle finger  . TRIGGER FINGER RELEASE Left 06/28/2016   Procedure: RELEASE TRIGGER FINGER LEFT 3RD FINGER;  Surgeon: [name], MD;  Location: [facility];  Service: Orthopedics;  Laterality: Left;  . TRIGGER FINGER RELEASE Right 06/12/2016   Procedure: RIGHT INDEX FINGER TRIGGER RELEASE;  Surgeon: [name], MD;  Location: [facility];  Service: Orthopedics;  Laterality: Right;  . TRIGGER FINGER RELEASE Right 01/19/2018   Procedure: RIGHT RING FINGER TRIGGER FINGER RELEASE;  Surgeon: [name], MD;  Location: [facility];  Service: Orthopedics;  Laterality: Right;   Social History   Occupational History    Comment: Disabled  Tobacco Use  . Smoking status: Current Every Day Smoker    Packs/day: 0.25    Years: 40.00    Pack years: 10.00    Types: Cigarettes    Start date: 01/28/1977    Last attempt to quit: 10/19/2018    Years since quitting: 0.1  . Smokeless tobacco: Never Used  . Tobacco comment: Quit 8 months 2019 - restart 05/2018  Substance and Sexual Activity  . Alcohol use: No    Alcohol/week: 0.0 standard drinks  . Drug use: Not Currently    Types: Marijuana, "Crack" cocaine    Comment: hx marijuana usemany years ago, Crack -none since 11/2015  . Sexual activity: Not Currently    Partners: Male    Birth control/protection: Post-menopausal

## 2018-12-03 ENCOUNTER — Telehealth: Payer: Self-pay | Admitting: Pharmacist

## 2018-12-03 LAB — CYTOLOGY - PAP
Comment: NEGATIVE
Diagnosis: NEGATIVE
High risk HPV: NEGATIVE

## 2018-12-03 NOTE — Telephone Encounter (Signed)
Patient contacted RE tobacco cessation AND Diabetes control.  Patient reports "excellent" control since taking Ozempic dose last week.  Reported readings 100-200.  Denies readings < 100 or sx of hypoglycemia.   Patient reports stress with son living in her house.  Reports he will move out by the end of the month (3 weeks from now).   She plans a quit date for 2 days from now (11/7).   Encouraged cessation.    Follow-up planned in 2 weeks at Rx Clinic visit.  1 week call plan for tobacco cessation as well.

## 2018-12-05 DIAGNOSIS — M48061 Spinal stenosis, lumbar region without neurogenic claudication: Secondary | ICD-10-CM | POA: Diagnosis not present

## 2018-12-05 DIAGNOSIS — M5090 Cervical disc disorder, unspecified, unspecified cervical region: Secondary | ICD-10-CM | POA: Diagnosis not present

## 2018-12-05 DIAGNOSIS — G8929 Other chronic pain: Secondary | ICD-10-CM | POA: Diagnosis not present

## 2018-12-05 DIAGNOSIS — G4733 Obstructive sleep apnea (adult) (pediatric): Secondary | ICD-10-CM | POA: Diagnosis not present

## 2018-12-05 DIAGNOSIS — J9621 Acute and chronic respiratory failure with hypoxia: Secondary | ICD-10-CM | POA: Diagnosis not present

## 2018-12-08 ENCOUNTER — Ambulatory Visit (INDEPENDENT_AMBULATORY_CARE_PROVIDER_SITE_OTHER): Payer: Medicare Other | Admitting: Orthopaedic Surgery

## 2018-12-08 ENCOUNTER — Other Ambulatory Visit: Payer: Self-pay

## 2018-12-08 ENCOUNTER — Other Ambulatory Visit: Payer: Self-pay | Admitting: Urology

## 2018-12-08 ENCOUNTER — Other Ambulatory Visit: Payer: Self-pay | Admitting: Family Medicine

## 2018-12-08 ENCOUNTER — Ambulatory Visit: Payer: Self-pay | Admitting: Licensed Clinical Social Worker

## 2018-12-08 ENCOUNTER — Telehealth (INDEPENDENT_AMBULATORY_CARE_PROVIDER_SITE_OTHER): Payer: Medicare Other | Admitting: Psychology

## 2018-12-08 DIAGNOSIS — M75121 Complete rotator cuff tear or rupture of right shoulder, not specified as traumatic: Secondary | ICD-10-CM

## 2018-12-08 DIAGNOSIS — Z6841 Body Mass Index (BMI) 40.0 and over, adult: Secondary | ICD-10-CM

## 2018-12-08 DIAGNOSIS — M549 Dorsalgia, unspecified: Secondary | ICD-10-CM

## 2018-12-08 DIAGNOSIS — Z7189 Other specified counseling: Secondary | ICD-10-CM

## 2018-12-08 DIAGNOSIS — F320 Major depressive disorder, single episode, mild: Secondary | ICD-10-CM

## 2018-12-08 NOTE — Chronic Care Management (AMB) (Signed)
   Care Coordination Clinical Social Work  Personal Care Services Referral Follow up  19/59/7471 Name: [name] MRN: [id number] DOB: October 07, 1965  Referred by: Dr. Owens Shark Reason for referral : Care Coordination ([name]) [name] is a 53 y.o. year old female who is a primary care patient of [name], MD.  F/U call to [facility] to check on status of [place] Referral.  Per Parke Simmers at [facility] they only received the 2nd page. Form place in faxed pile to be refaxed by front office staff to [name] at [phone].   Plan:LCSW will call [facility] in about 2 to 3 business days to verify application is received and processed.   [name], LCSW Clinical Social Worker [place] [facility] [place]   [phone] 10:35 AM

## 2018-12-08 NOTE — Progress Notes (Signed)
Integrated Behavioral Health Visit via Telemedicine (Telephone)  [id number] WILLINE SCHWALBE [id number]   Session Start time:10AM  Session End time: 1020AM Total time: 20  Referring Provider: Dr. Caron Presume Type of Visit: Telephonic Patient location: home [facility] Provider location: [facility] All persons participating in visit: pt and provider    Discussed confidentiality: Yes    The following statements were read to the patient and/or legal guardian that are established with the [facility] Provider.  "The purpose of this phone visit is to provide behavioral health care while limiting exposure to the coronavirus (COVID19).  There is a possibility of technology failure and discussed alternative modes of communication if that failure occurs."  "By engaging in this telephone visit, you consent to the provision of healthcare.  Additionally, you authorize for your insurance to be billed for the services provided during this telephone visit."   Patient and/or legal guardian consented to telephone visit: Yes   PRESENTING CONCERNS: Patient and/or family reports the following symptoms/concerns: Pt reports lot of stress relating to getting her daughter help for substance use disorder and paying bills.  Pt was given more resources for her daughter.  Pt and Dr. Hartford Poli talked about challenging cognitions of stress and thinking about what she can control.  Duration of problem: 3 months; Severity of problem: mild  STRENGTHS (Protective Factors/Coping Skills): Pt engages in praying and identified support systems  GOALS ADDRESSED: Patient will: 1.  Reduce symptoms of: depression  2.  Increase knowledge and/or ability of: coping skills  3.  Demonstrate ability to: Increase healthy adjustment to current life circumstances  INTERVENTIONS: Interventions utilized:  Behavioral Activation and Brief CBT Standardized Assessments completed: Not Needed  ASSESSMENT: Patient currently experiencing depressed  mood related to stressful life circumstances.   Patient may benefit from CBT coping strategies and continued medication management by PCP.   PLAN: 1. Follow up with behavioral health clinician on : engagement in CBT coping strategies  2. Behavioral recommendations: Continue to work on challenging stress with self-care and cognition challenging  3. Referral(s): [place] (In Clinic)  Erlinda Hong, PhD., LMFT-A

## 2018-12-08 NOTE — Progress Notes (Signed)
Office Visit Note   Patient: [name]           Date of Birth: Nov 11, 1965           MRN: [id number] Visit Date: 12/08/2018              Requested by: [name], MD [address],  [place] [address] PCP: [name], MD   Assessment & Plan: Visit Diagnoses:  1. Nontraumatic complete tear of right rotator cuff   2. Body mass index 50.0-59.9, adult ([place])   3. Morbid obesity ([place])     Plan: MRI shows a full-thickness rotator cuff tear of the supraspinatus near its anterior insertion.  She has a type II acromion.  Based on these findings and our discussion today patient would like to proceed with arthroscopic rotator cuff repair debridements.  We will have to wait until the A1c is below 8.0 before we can proceed with surgery.  We will also obtain preoperative medical and cardiac clearances.  We did discuss that given her body habitus and increased BMI that is good be a technically challenging surgery and associated potential complications and risks were reviewed today.  Patient understands and would like to proceed with scheduling for the surgery. The patient meets the AMA guidelines for Morbid (severe) obesity with a BMI > 40.0 and I have recommended weight loss  Follow-Up Instructions: Return if symptoms worsen or fail to improve.   Orders:  No orders of the defined types were placed in this encounter.  No orders of the defined types were placed in this encounter.     Procedures: No procedures performed   Clinical Data: No additional findings.   Subjective: Chief Complaint  Patient presents with  . Right Shoulder - Pain  . Left Shoulder - Pain    [name] is a 53 year old female comes in for evaluation of chronic right shoulder pain.  She has been seeing Dr. Louanne Skye for this.  Shoulder MRI has recently been completed.  She has chronic pain and she is unable to perform ADLs without significant pain.  She has undergone extensive conservative treatment  in terms of physical therapy.  She cannot have ordered all injections due to side effects of elevated blood sugar with her diabetes.   Review of Systems  Constitutional: Negative.   HENT: Negative.   Eyes: Negative.   Respiratory: Negative.   Cardiovascular: Negative.   Endocrine: Negative.   Musculoskeletal: Negative.   Neurological: Negative.   Hematological: Negative.   Psychiatric/Behavioral: Negative.   All other systems reviewed and are negative.    Objective: Vital Signs: LMP 04/10/2006   Physical Exam Vitals signs and nursing note reviewed.  Constitutional:      Appearance: She is well-developed.  Pulmonary:     Effort: Pulmonary effort is normal.  Skin:    General: Skin is warm.     Capillary Refill: Capillary refill takes less than 2 seconds.  Neurological:     Mental Status: She is alert and oriented to person, place, and time.  Psychiatric:        Behavior: Behavior normal.        Thought Content: Thought content normal.        Judgment: Judgment normal.     Ortho Exam Right shoulder exam shows pain with testing of supraspinatus tendon.  She has significant guarding to passive range of motion.  Positive impingement. Specialty Comments:  No specialty comments available.  Imaging: No results found.  PMFS History: Patient Active Problem List   Diagnosis Date Noted  . Nontraumatic complete tear of right rotator cuff 12/08/2018  . Rotator cuff tear, right 12/01/2018  . Chest pain 09/24/2018  . Right groin pain 07/20/2018  . Depressed mood 07/13/2018  . Mouth pain 06/02/2018  . Paroxysmal SVT (supraventricular tachycardia) ([place]) 03/20/2018  . Pure hypercholesterolemia 03/20/2018  . Muscle cramping 02/25/2018  . Nodule of upper lobe of right lung 02/13/2018  . Falls, subsequent encounter 01/30/2018  . Open wound of left foot 11/04/2017  . Essential hypertension 10/27/2017  . Solitary pulmonary nodule 10/07/2017  . Restrictive lung disease  secondary to obesity 10/01/2017  . Polycythemia, secondary 10/01/2017  . Chronic viral hepatitis B without delta-agent ([place]) 07/08/2017  . COPD (chronic obstructive pulmonary disease) with chronic bronchitis ([place]) 06/27/2017  . Chronic kidney disease (CKD), stage IV (severe) ([place]) 01/14/2017  . Type 2 diabetes mellitus with stage 3 chronic kidney disease, with long-term current use of insulin ([place]) 12/12/2016  . Urge incontinence of urine 12/05/2016  . Trigger index finger of left hand 07/15/2016  . Back pain 04/07/2014  . Morbid obesity ([place]) 10/02/2012  . Chronic diastolic CHF (congestive heart failure) ([place]) 04/07/2012  . GERD 01/26/2010  . Obstructive sleep apnea treated with BiPAP 02/03/2008  . FIBROCYSTIC BREAST DISEASE 10/28/2006  . Hyperlipidemia 03/27/2006  . Obesity hypoventilation syndrome ([place]) 03/27/2006  . Former tobacco use 03/27/2006  . Depression with anxiety 03/27/2006   Past Medical History:  Diagnosis Date  . Anxiety   . Arthritis 04-10-11   hips, shoulders, back  . Asthma   . Bipolar disorder ([place])   . Cancer ([place]) 1993   cervical, no treatment done, went away per pt  . Cervical dysplasia or atypia 04-10-11   '93- once dx.-got pregnant-no intervention, then postpartum, no dysplasia found  . CHF (congestive heart failure) ([place])    no cardiologist 2014 dx, none now  . Chronic kidney disease    stage 3 kidney disease  . 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 Pathology showed Condyloma, gluteal CONDYLOMA ACUMINATUM.   [name] Kitchen COPD (chronic obstructive pulmonary disease) ([place])   . Diabetes mellitus without complication ([place])   . Dyspnea    with actity, sitting  . Fall 12/16/2016  . Fibrocystic breast disease   . GERD (gastroesophageal reflux disease)   . Hepatitis    hep B-count is low at present,doesn't register  . Hypercalcemia   . Hyperlipidemia   . Hypertension   . Lung nodule 11/2017   right and left lung  . Migraine headache     none recent  . Morbid obesity ([place]) 03/27/2006  . Neuropathy   . Pneumonia    walking pneu 15 yrs. ago  . Skin lesion 03/15/2011   In gluteal crease now s/p removal by Dr. [name] of General Surgery on 3/12. Path shows condyloma.     . Sleep apnea 04-10-11   uses cpap, pt does not know settings  . Sleep apnea   . Trigger finger    left third    Family History  Problem Relation Age of Onset  . Pulmonary embolism Mother   . Stroke Father   . Alcohol abuse Father   . Pneumonia Father   . Cancer Maternal Aunt        breast  . Breast cancer Maternal Aunt   . Asthma Other   . COPD Other   . Hypertension Other   . Diabetes Other   .  Asthma Sister   . Depression Sister   . Arthritis Sister   . Sickle cell anemia Son     Past Surgical History:  Procedure Laterality Date  . ANAL FISTULECTOMY  04/17/2011   Procedure: FISTULECTOMY ANAL;  Surgeon: Imogene Burn. [name], MD;  Location: [facility];  Service: General;  Laterality: N/A;  Excision of Condyloma Gluteal Cleft   . BACK SURGERY  04-10-11   x5-Lumbar fusion-retained hardware.(Dr. Louanne Skye)  . BREAST CYST EXCISION     bilateral breast, 3 cysts removed from each breast  . BREAST EXCISIONAL BIOPSY Right    x 3  . BREAST EXCISIONAL BIOPSY Left    x 3  . CARPAL TUNNEL RELEASE Bilateral   . Cervical biospy    . COLONOSCOPY WITH PROPOFOL N/A 07/06/2015   Procedure: COLONOSCOPY WITH PROPOFOL;  Surgeon: [name], MD;  Location: [facility] ENDOSCOPY;  Service: Endoscopy;  Laterality: N/A;  . DOPPLER ECHOCARDIOGRAPHY  04/06/2012   AT [place] 55-60%  . FRACTURE SURGERY    . I&D EXTREMITY Left 12/18/2016   Procedure: IRRIGATION AND DEBRIDEMENT LEFT FOOT, CLOSURE;  Surgeon: [name], MD;  Location: [facility];  Service: Orthopedics;  Laterality: Left;  [name] Kitchen MULTIPLE TOOTH EXTRACTIONS    . NECK SURGERY  04-10-11   x3- cervical fusion with plating and screws-Dr. [name]  . TEE WITHOUT CARDIOVERSION N/A 04/06/2012   Procedure: TRANSESOPHAGEAL  ECHOCARDIOGRAM (TEE);  Surgeon: [name], MD;  [facility]OS[facility];  Service: Cardiovascular;  Laterality: N/A;  . TRIGGER FINGER RELEASE Right    middle finger  . TRIGGER FINGER RELEASE Left 06/28/2016   Procedure: RELEASE TRIGGER FINGER LEFT 3RD FINGER;  Surgeon: [name], MD;  Location: [facility];  Service: Orthopedics;  Laterality: Left;  . TRIGGER FINGER RELEASE Right 06/12/2016   Procedure: RIGHT INDEX FINGER TRIGGER RELEASE;  Surgeon: [name], MD;  Location: [facility];  Service: Orthopedics;  Laterality: Right;  . TRIGGER FINGER RELEASE Right 01/19/2018   Procedure: RIGHT RING FINGER TRIGGER FINGER RELEASE;  Surgeon: [name], MD;  Location: [facility];  Service: Orthopedics;  Laterality: Right;   Social History   Occupational History    Comment: Disabled  Tobacco Use  . Smoking status: Current Every Day Smoker    Packs/day: 0.25    Years: 40.00    Pack years: 10.00    Types: Cigarettes    Start date: 01/28/1977    Last attempt to quit: 10/19/2018    Years since quitting: 0.1  . Smokeless tobacco: Never Used  . Tobacco comment: Quit 8 months 2019 - restart 05/2018  Substance and Sexual Activity  . Alcohol use: No    Alcohol/week: 0.0 standard drinks  . Drug use: Not Currently    Types: Marijuana, "Crack" cocaine    Comment: hx marijuana usemany years ago, Crack -none since 11/2015  . Sexual activity: Not Currently    Partners: Male    Birth control/protection: Post-menopausal

## 2018-12-09 ENCOUNTER — Encounter: Payer: Self-pay | Admitting: Specialist

## 2018-12-10 ENCOUNTER — Other Ambulatory Visit: Payer: Self-pay | Admitting: Family Medicine

## 2018-12-10 ENCOUNTER — Encounter: Payer: Self-pay | Admitting: Orthopaedic Surgery

## 2018-12-10 DIAGNOSIS — B353 Tinea pedis: Secondary | ICD-10-CM

## 2018-12-10 DIAGNOSIS — E1151 Type 2 diabetes mellitus with diabetic peripheral angiopathy without gangrene: Secondary | ICD-10-CM

## 2018-12-10 NOTE — Telephone Encounter (Signed)
Postpone surgery due to A1C

## 2018-12-11 ENCOUNTER — Other Ambulatory Visit: Payer: Self-pay

## 2018-12-11 ENCOUNTER — Ambulatory Visit
Admission: RE | Admit: 2018-12-11 | Discharge: 2018-12-11 | Disposition: A | Discharge status: Home / Self Care | Payer: Medicare Other | Source: Ambulatory Visit | Attending: Family Medicine | Admitting: Family Medicine

## 2018-12-11 ENCOUNTER — Ambulatory Visit: Payer: Self-pay | Admitting: Licensed Clinical Social Worker

## 2018-12-11 ENCOUNTER — Telehealth: Payer: Self-pay | Admitting: Pharmacist

## 2018-12-11 ENCOUNTER — Encounter: Payer: Self-pay | Admitting: Orthopaedic Surgery

## 2018-12-11 DIAGNOSIS — Z1231 Encounter for screening mammogram for malignant neoplasm of breast: Secondary | ICD-10-CM | POA: Diagnosis not present

## 2018-12-11 DIAGNOSIS — Z741 Need for assistance with personal care: Secondary | ICD-10-CM

## 2018-12-11 NOTE — Telephone Encounter (Signed)
Contacted patient in follow-up of tobacco cessation attempt AND blood glucose control.   Patient reports "excellent" blood sugar control.  Readings reported as "100s"  Tolerating Ozempic at current dose.   Reports continued use of cigarettes ~ 6 per day and continues to plan for quit in the near future.   She would like to be quit by the end of the month.    She continues on Chantix 0.5mg  BID at this time without adverse effect.    Plan phone follow-up in ~ 2 weeks.

## 2018-12-11 NOTE — Chronic Care Management (AMB) (Signed)
    Care Coordination Clinical Social Work  Personal Care Services Follow up  52/71/2929 Name: ANNAIS CRAFTS MRN: [id number] DOB: [birth year]   LCSW called [facility] [phone] to verify receipt and process of patient's [name] application.  Per Daryl [facility] received patient's [facility] referral however patient is currently receiving the max number of [name] hours based on information completed by PCP.      Per [facility], the patient would only qualify for additional hours if she has a cognitive impairment.  LCSW reviewed medical diagnosis in the chart.  No cognitive diagnosis listed.  Plan: Will provide update to PCP.    [name], LCSW Clinical Social Worker [place] [facility] [place]   [phone] 3:31 PM

## 2018-12-15 NOTE — Telephone Encounter (Signed)
I spoke with the patient today 11/17 and informed her that she would not qualify for further assistance from her personal care aide.  She was understanding and agreeable.  I told her to contact the clinic if she had any questions or concerns about this.

## 2018-12-16 ENCOUNTER — Other Ambulatory Visit: Payer: Self-pay | Admitting: Family Medicine

## 2018-12-17 ENCOUNTER — Encounter: Payer: Self-pay | Admitting: Orthopaedic Surgery

## 2018-12-17 ENCOUNTER — Ambulatory Visit (INDEPENDENT_AMBULATORY_CARE_PROVIDER_SITE_OTHER): Payer: Medicare Other | Admitting: Pharmacist

## 2018-12-17 ENCOUNTER — Encounter: Payer: Self-pay | Admitting: Pharmacist

## 2018-12-17 ENCOUNTER — Other Ambulatory Visit: Payer: Self-pay

## 2018-12-17 DIAGNOSIS — Z794 Long term (current) use of insulin: Secondary | ICD-10-CM | POA: Diagnosis not present

## 2018-12-17 DIAGNOSIS — N183 Chronic kidney disease, stage 3 unspecified: Secondary | ICD-10-CM

## 2018-12-17 DIAGNOSIS — Z87891 Personal history of nicotine dependence: Secondary | ICD-10-CM | POA: Diagnosis not present

## 2018-12-17 DIAGNOSIS — M109 Gout, unspecified: Secondary | ICD-10-CM | POA: Insufficient documentation

## 2018-12-17 DIAGNOSIS — E1122 Type 2 diabetes mellitus with diabetic chronic kidney disease: Secondary | ICD-10-CM | POA: Diagnosis not present

## 2018-12-17 LAB — POCT GLYCOSYLATED HEMOGLOBIN (HGB A1C): HbA1c, POC (controlled diabetic range): 8.5 % — AB (ref 0.0–7.0)

## 2018-12-17 MED ORDER — INSULIN LISPRO (1 UNIT DIAL) 100 UNIT/ML (KWIKPEN): 8.0000 [IU] | PEN_INJECTOR | Freq: Three times a day (TID) | SUBCUTANEOUS | Status: DC

## 2018-12-17 MED ORDER — DICLOFENAC SODIUM 1 % EX GEL: 2.0000 g | Freq: Four times a day (QID) | CUTANEOUS | 1 refills | Status: DC

## 2018-12-17 MED ORDER — OZEMPIC (0.25 OR 0.5 MG/DOSE) 2 MG/1.5ML ~~LOC~~ SOPN: 0.2500 mg | PEN_INJECTOR | SUBCUTANEOUS | Status: DC

## 2018-12-17 NOTE — Progress Notes (Signed)
Reviewed: I agree with Dr. Koval's documentation and management. 

## 2018-12-17 NOTE — Assessment & Plan Note (Signed)
Diabetes longstanding currently improving but still uncontrolled. Patient is able to verbalize appropriate hypoglycemia management plan. Patient appears adherent with medication. Control is suboptimal due to abdominal pain (self-treating with ginger ale). -Continued basal insulin Lantus (insulin glargine) 20 units once daily in the morning.   -Increased dose of  rapid insulin Humalog (insulin lispro) from 6 to 8 units TID before meals.  -Decreased dose of GLP-1 Ozempic (semaglutide) to 2.92 mg MINUS two clicks once weekly.  -Continued SGLT2-I Jardiance (empagliflozin) 25 mg once daily. -Extensively discussed pathophysiology of diabetes, recommended lifestyle interventions, dietary effects on blood sugar control -Counseled on s/sx of and management of hypoglycemia -Obtained A1c today which was similar to most recent 8.5 today last 8.2 (not low enough for surgery). Consider next A1c 01/2019.

## 2018-12-17 NOTE — Assessment & Plan Note (Signed)
Tobacco cessation - down to 4 cigs/day, quit date anticipated TOMORROW 12/18/2018.

## 2018-12-17 NOTE — Patient Instructions (Addendum)
It was great to see you!  Medications adjustments:  Decrease Ozempic to 9.93 mg MINUS two clicks (go to 5.70 in the window on the pen, then go down 2 clicks). Increase mealtime insulin Humalog to 8 units three times a day before meals.  Refilled Voltaren (diclofenac 1% gel) for 5 boxes today.  Will follow up by phone on next Wednesday, 12/23/2018 to discuss diabetes and tobacco cessation.

## 2018-12-17 NOTE — Progress Notes (Addendum)
S:     Chief Complaint  Patient presents with  . Medication Management    DM follow up / Tobacco Cessation / gout    Patient arrives in normal spirits, ambulating with mild shortness of breath noted which improved with sitting for a few minutes.  She is on home oxygen but does not like to carry it with her because it is too heavy. Presents for diabetes evaluation, education, management, and .   Patient states she "likes Ozempic" however it remains causing her to have diarrhea/stomach cramps.   Patient was referred and last seen by Primary Care Provider Dr. Caron Presume on 11/23/2018.   Family/Social History: family hx of diabetes with aunt, grandmother, and "everyone on mother's side".   Insurance coverage/medication affordability: Medicare/Medicaid   Patient reports adherence with medications.  Current diabetes medications include: Lantus 20 units daily, Humalog 6 units TID, Ozempic 0.25 mg weekly (stared 11/19/18), Jardiance 25 mg daily Current hypertension medications include: losartan 100 mg daily  Current hyperlipidemia medications include: atorvastatin 40, fish oil 1 gram daily    Patient denies hypoglycemic events.  Patient reported dietary habits: Eats 3 meals/day  Breakfast: smoothie with protein  Lunch: mixed greens with smoked [place] wings Dinner:seafood, vegetables, baked chicken wings, mixed greens with smoked [place] Snacks: light salt potato chips, sugar free popsicles, sugar free jello Drinks: water, half cup of cherry juice at night for gout, diet mountain dew, zero sugar powerade -"doesn't do salt or butter" -"addicted to [employer] and sugar free popsicles" -"trying to eat smaller portions"  Patient-reported exercise habits: none   Patient reports nocturia (nighttime urination) Has overactive bladder- bladder surgery 12/29/18- unable to determine if d/t diabetes.  Patient denies neuropathy (nerve pain). Patient denies visual changes. Patient reports  self foot exams.     O:  Physical Exam Vitals signs reviewed.  Constitutional:      Appearance: Normal appearance.  Neurological:     Mental Status: She is alert.  Psychiatric:        Mood and Affect: Mood normal.        Behavior: Behavior normal.        Thought Content: Thought content normal.        Judgment: Judgment normal.    Review of Systems  Respiratory: Positive for shortness of breath.   Gastrointestinal: Positive for abdominal pain and diarrhea.  All other systems reviewed and are negative.    Lab Results  Component Value Date   HGBA1C 8.5 (A) 12/17/2018   Vitals:   12/17/18 0915  BP: 124/80  Pulse: 91  SpO2: 97%    Lipid Panel     Component Value Date/Time   CHOL 146 09/24/2018 2336   CHOL 145 10/27/2017 0944   TRIG 165 (H) 09/24/2018 2336   HDL 37 (L) 09/24/2018 2336   HDL 55 10/27/2017 0944   CHOLHDL 3.9 09/24/2018 2336   VLDL 33 09/24/2018 2336   LDLCALC 76 09/24/2018 2336   LDLCALC 70 10/27/2017 0944   LDLDIRECT 114 (H) 09/15/2012 1559    Home fasting blood sugars: 158-222 2 hour post-meal/random blood sugars: 124-244  7 day average: 173 14 day average: 179   Clinical Atherosclerotic Cardiovascular Disease (ASCVD): No  The 10-year ASCVD risk score Mikey Bussing DC Jr., et al., 2013) is: 21.2%   Values used to calculate the score:     Age: 53 years     Sex: Female     Is Non-Hispanic African American: Yes  Diabetic: Yes     Tobacco smoker: Yes     Systolic Blood Pressure: 825 mmHg     Is BP treated: Yes     HDL Cholesterol: 37 mg/dL     Total Cholesterol: 146 mg/dL    A/P: Diabetes longstanding currently improving but still uncontrolled. Patient is able to verbalize appropriate hypoglycemia management plan. Patient appears adherent with medication. Control is suboptimal due to abdominal pain (self-treating with ginger ale). -Continued basal insulin Lantus (insulin glargine) 20 units once daily in the morning.   -Increased dose of   rapid insulin Humalog (insulin lispro) from 6 to 8 units TID before meals.  -Decreased dose of GLP-1 Ozempic (semaglutide) to 0.03 mg MINUS two clicks once weekly.  -Continued SGLT2-I Jardiance (empagliflozin) 25 mg once daily. -Extensively discussed pathophysiology of diabetes, recommended lifestyle interventions, dietary effects on blood sugar control -Counseled on s/sx of and management of hypoglycemia -Obtained A1c today which was similar to most recent 8.5 today last 8.2 (not low enough for surgery). Consider next A1c 01/2019.   ASCVD risk - primary prevention in patient with diabetes. Last LDL is controlled. ASCVD risk score is >20%  - high intensity statin indicated. Aspirin is contraindicated d/t renal function. -Continued atorvastatin 40 mg.   Hypertension longstanding currently controlled.  Blood pressure goal <130/80 mmHg. Patient is adherent with her medication.  Tobacco cessation - down to 4 cigs/day, quit date anticipated TOMORROW 12/18/2018.  Patient reported history of gout - taking febuxostat (uloric) 40mg  daily who denies recent flare.  Check Uric acid.   Chronic pain - uses diclofenac gel.  Prescribed less than needed.  Requested refill.  Agreed to provide refills.   Written patient instructions provided.  Total time in face to face counseling 60 minutes.   Follow up by phone with Pharmacist Dr. Valentina Lucks on Wednesday, 12/23/2018.   Patient seen with Donald Prose, PharmD Candidate, Simonne Come, PharmD Candidate, Sherren Kerns, PharmD PGY-1 Resident. and Drexel Iha, PharmD,  PGY2 Pharmacy Resident.   Uric Acid was 7.6 Potassium was low at 3.2 Scr was 1.45 Routed labs to PCP with suggestion to consider spironolactone, and trial off of metolazone.

## 2018-12-18 ENCOUNTER — Encounter (HOSPITAL_COMMUNITY): Payer: Self-pay

## 2018-12-18 LAB — BASIC METABOLIC PANEL
BUN/Creatinine Ratio: 16 (ref 9–23)
BUN: 23 mg/dL (ref 6–24)
CO2: 22 mmol/L (ref 20–29)
Calcium: 9.7 mg/dL (ref 8.7–10.2)
Chloride: 96 mmol/L (ref 96–106)
Creatinine, Ser: 1.45 mg/dL — ABNORMAL HIGH (ref 0.57–1.00)
GFR calc Af Amer: 47 mL/min/{1.73_m2} — ABNORMAL LOW (ref >59)
GFR calc non Af Amer: 41 mL/min/{1.73_m2} — ABNORMAL LOW (ref >59)
Glucose: 152 mg/dL — ABNORMAL HIGH (ref 65–99)
Potassium: 3.2 mmol/L — ABNORMAL LOW (ref 3.5–5.2)
Sodium: 143 mmol/L (ref 134–144)

## 2018-12-18 LAB — URIC ACID: Uric Acid: 7.6 mg/dL — ABNORMAL HIGH (ref 2.5–7.1)

## 2018-12-18 NOTE — Patient Instructions (Addendum)
DUE TO COVID-19 ONLY ONE VISITOR IS ALLOWED TO COME WITH YOU AND STAY IN THE WAITING ROOM ONLY DURING PRE OP AND PROCEDURE. THE ONE VISITOR MAY VISIT WITH YOU IN YOUR PRIVATE ROOM DURING VISITING HOURS ONLY!!   COVID SWAB TESTING MUST BE COMPLETED ON:  Friday, Nov. 27, 2020 at   0830 am [address] [facility] enter pre surgical testing line (Must self quarantine after testing. Follow instructions on handout.)             Your procedure is scheduled on: Tuesday, Dec. 1, 2020   Report to [facility] Main  Entrance    Report to admitting at 8:30 AM   Call this number if you have problems the morning of surgery [phone]   Do not eat food or drink liquids :After Midnight.   Bring CPAP mask and tubing day of surgery!   Brush your teeth the morning of surgery.   Do NOT smoke after Midnight   Take these medicines the morning of surgery with A SIP OF WATER: Atorvastatin, Buspirone, Diltiazem, Gabapentin, Loratadine, Omeprazole, Sertraline, Use Flonase nasal spray if needed   May use Flonase and Inhaler per normal routine the morning of surgery   Bring Rescue Inhaler day of surgery                               You may not have any metal on your body including hair pins, jewelry, and body piercings             Do not wear make-up, lotions, powders, perfumes/cologne, or deodorant             Do not wear nail polish.  Do not shave  48 hours prior to surgery.                Do not bring valuables to the hospital. [place].   Contacts, dentures or bridgework may not be worn into surgery.    Patients discharged the day of surgery will not be allowed to drive home.   Special Instructions: Bring a copy of your healthcare power of attorney and living will documents         the day of surgery if you haven't scanned them in before.              Please read over the following fact sheets you were  given:  How to Manage Your Diabetes Before and After Surgery  Why is it important to control my blood sugar before and after surgery? . Improving blood sugar levels before and after surgery helps healing and can limit problems. . A way of improving blood sugar control is eating a healthy diet by: o  Eating less sugar and carbohydrates o  Increasing activity/exercise o  Talking with your doctor about reaching your blood sugar goals . High blood sugars (greater than 180 mg/dL) can raise your risk of infections and slow your recovery, so you will need to focus on controlling your diabetes during the weeks before surgery. . Make sure that the doctor who takes care of your diabetes knows about your planned surgery including the date and location.  How do I manage my blood sugar before surgery? . Check your blood sugar at least 4 times a day, starting 2  days before surgery, to make sure that the level is not too high or low. o Check your blood sugar the morning of your surgery when you wake up and every 2 hours until you get to the Short Stay unit. . If your blood sugar is less than 70 mg/dL, you will need to treat for low blood sugar: o Do not take insulin. o Treat a low blood sugar (less than 70 mg/dL) with  cup of clear juice (cranberry or apple), 4 glucose tablets, OR glucose gel. o Recheck blood sugar in 15 minutes after treatment (to make sure it is greater than 70 mg/dL). If your blood sugar is not greater than 70 mg/dL on recheck, call [phone] for further instructions. . Report your blood sugar to the short stay nurse when you get to Short Stay.  . If you are admitted to the hospital after surgery: o Your blood sugar will be checked by the staff and you will probably be given insulin after surgery (instead of oral diabetes medicines) to make sure you have good blood sugar levels. o The goal for blood sugar control after surgery is 80-180 mg/dL.   WHAT DO I DO ABOUT MY DIABETES  MEDICATION?   Monday, Nov. 30, 2020 DO NOT TAKE JARDIANCE. May take usual dose of Humalog before meals but DO NOT TAKE any at bedtime.  . Do not take oral diabetes medicines (pills) the morning of surgery.    . THE MORNING OF SURGERY, take 10  units of    Lantus     insulin.   Use the scale below for your Humalog Sliding Scale insulin for the morning of surgery! . If your CBG is greater than 220 mg/dL, you may take  of your sliding scale  . (correction) dose of insulin.      [place] - Preparing for Surgery Before surgery, you can play an important role.  Because skin is not sterile, your skin needs to be as free of germs as possible.  You can reduce the number of germs on your skin by washing with CHG (chlorahexidine gluconate) soap before surgery.  CHG is an antiseptic cleaner which kills germs and bonds with the skin to continue killing germs even after washing. Please DO NOT use if you have an allergy to CHG or antibacterial soaps.  If your skin becomes reddened/irritated stop using the CHG and inform your nurse when you arrive at Short Stay. Do not shave (including legs and underarms) for at least 48 hours prior to the first CHG shower.  You may shave your face/neck.  Please follow these instructions carefully:  1.  Shower with CHG Soap the night before surgery and the  morning of surgery.  2.  If you choose to wash your hair, wash your hair first as usual with your normal  shampoo.  3.  After you shampoo, rinse your hair and body thoroughly to remove the shampoo.                             4.  Use CHG as you would any other liquid soap.  You can apply chg directly to the skin and wash.  Gently with a scrungie or clean washcloth.  5.  Apply the CHG Soap to your body ONLY FROM THE NECK DOWN.   Do   not use on face/ open  Wound or open sores. Avoid contact with eyes, ears mouth and   genitals (private parts).                       Wash face,  Genitals  (private parts) with your normal soap.             6.  Wash thoroughly, paying special attention to the area where your    surgery  will be performed.  7.  Thoroughly rinse your body with warm water from the neck down.  8.  DO NOT shower/wash with your normal soap after using and rinsing off the CHG Soap.                9.  Pat yourself dry with a clean towel.            10.  Wear clean pajamas.            11.  Place clean sheets on your bed the night of your first shower and do not  sleep with pets. Day of Surgery : Do not apply any lotions/deodorants the morning of surgery.  Please wear clean clothes to the hospital/surgery center.  FAILURE TO FOLLOW THESE INSTRUCTIONS MAY RESULT IN THE CANCELLATION OF YOUR SURGERY  PATIENT SIGNATURE_________________________________  NURSE SIGNATURE__________________________________  ________________________________________________________________________

## 2018-12-19 DIAGNOSIS — I471 Supraventricular tachycardia: Secondary | ICD-10-CM

## 2018-12-21 ENCOUNTER — Telehealth: Payer: Self-pay | Admitting: Family Medicine

## 2018-12-21 ENCOUNTER — Encounter (HOSPITAL_COMMUNITY)
Admission: RE | Admit: 2018-12-21 | Discharge: 2018-12-21 | Disposition: A | Discharge status: Home / Self Care | Payer: Medicare Other | Source: Ambulatory Visit | Attending: Urology | Admitting: Urology

## 2018-12-21 ENCOUNTER — Encounter (HOSPITAL_COMMUNITY): Payer: Self-pay

## 2018-12-21 ENCOUNTER — Other Ambulatory Visit: Payer: Self-pay

## 2018-12-21 DIAGNOSIS — M1610 Unilateral primary osteoarthritis, unspecified hip: Secondary | ICD-10-CM | POA: Diagnosis not present

## 2018-12-21 DIAGNOSIS — N183 Chronic kidney disease, stage 3 unspecified: Secondary | ICD-10-CM | POA: Diagnosis not present

## 2018-12-21 DIAGNOSIS — K219 Gastro-esophageal reflux disease without esophagitis: Secondary | ICD-10-CM | POA: Diagnosis not present

## 2018-12-21 DIAGNOSIS — F319 Bipolar disorder, unspecified: Secondary | ICD-10-CM | POA: Insufficient documentation

## 2018-12-21 DIAGNOSIS — F17200 Nicotine dependence, unspecified, uncomplicated: Secondary | ICD-10-CM | POA: Diagnosis not present

## 2018-12-21 DIAGNOSIS — Z87891 Personal history of nicotine dependence: Secondary | ICD-10-CM

## 2018-12-21 DIAGNOSIS — I130 Hypertensive heart and chronic kidney disease with heart failure and stage 1 through stage 4 chronic kidney disease, or unspecified chronic kidney disease: Secondary | ICD-10-CM | POA: Insufficient documentation

## 2018-12-21 DIAGNOSIS — Z79899 Other long term (current) drug therapy: Secondary | ICD-10-CM | POA: Insufficient documentation

## 2018-12-21 DIAGNOSIS — Z01812 Encounter for preprocedural laboratory examination: Secondary | ICD-10-CM | POA: Diagnosis not present

## 2018-12-21 DIAGNOSIS — I5032 Chronic diastolic (congestive) heart failure: Secondary | ICD-10-CM | POA: Diagnosis not present

## 2018-12-21 DIAGNOSIS — M19019 Primary osteoarthritis, unspecified shoulder: Secondary | ICD-10-CM | POA: Diagnosis not present

## 2018-12-21 DIAGNOSIS — I447 Left bundle-branch block, unspecified: Secondary | ICD-10-CM | POA: Diagnosis not present

## 2018-12-21 DIAGNOSIS — J449 Chronic obstructive pulmonary disease, unspecified: Secondary | ICD-10-CM | POA: Insufficient documentation

## 2018-12-21 DIAGNOSIS — E1122 Type 2 diabetes mellitus with diabetic chronic kidney disease: Secondary | ICD-10-CM | POA: Diagnosis not present

## 2018-12-21 DIAGNOSIS — E785 Hyperlipidemia, unspecified: Secondary | ICD-10-CM | POA: Insufficient documentation

## 2018-12-21 DIAGNOSIS — Z794 Long term (current) use of insulin: Secondary | ICD-10-CM | POA: Insufficient documentation

## 2018-12-21 DIAGNOSIS — N3941 Urge incontinence: Secondary | ICD-10-CM | POA: Diagnosis not present

## 2018-12-21 DIAGNOSIS — G4730 Sleep apnea, unspecified: Secondary | ICD-10-CM | POA: Diagnosis not present

## 2018-12-21 DIAGNOSIS — M47819 Spondylosis without myelopathy or radiculopathy, site unspecified: Secondary | ICD-10-CM | POA: Insufficient documentation

## 2018-12-21 DIAGNOSIS — Z6841 Body Mass Index (BMI) 40.0 and over, adult: Secondary | ICD-10-CM | POA: Insufficient documentation

## 2018-12-21 DIAGNOSIS — F419 Anxiety disorder, unspecified: Secondary | ICD-10-CM | POA: Diagnosis not present

## 2018-12-21 HISTORY — DX: Personal history of other diseases of the circulatory system: Z86.79

## 2018-12-21 HISTORY — DX: Chronic respiratory failure with hypoxia: J96.11

## 2018-12-21 HISTORY — DX: Urge incontinence: N39.41

## 2018-12-21 HISTORY — DX: Cardiomegaly: I51.7

## 2018-12-21 LAB — COMPREHENSIVE METABOLIC PANEL
ALT: 34 U/L (ref 0–44)
AST: 33 U/L (ref 15–41)
Albumin: 4.1 g/dL (ref 3.5–5.0)
Alkaline Phosphatase: 81 U/L (ref 38–126)
Anion gap: 12 (ref 5–15)
BUN: 30 mg/dL — ABNORMAL HIGH (ref 6–20)
CO2: 32 mmol/L (ref 22–32)
Calcium: 9.3 mg/dL (ref 8.9–10.3)
Chloride: 96 mmol/L — ABNORMAL LOW (ref 98–111)
Creatinine, Ser: 1.38 mg/dL — ABNORMAL HIGH (ref 0.44–1.00)
GFR calc Af Amer: 50 mL/min — ABNORMAL LOW (ref >60)
GFR calc non Af Amer: 43 mL/min — ABNORMAL LOW (ref >60)
Glucose, Bld: 139 mg/dL — ABNORMAL HIGH (ref 70–99)
Potassium: 3.2 mmol/L — ABNORMAL LOW (ref 3.5–5.1)
Sodium: 140 mmol/L (ref 135–145)
Total Bilirubin: 0.7 mg/dL (ref 0.3–1.2)
Total Protein: 7.6 g/dL (ref 6.5–8.1)

## 2018-12-21 LAB — CBC
HCT: 50.6 % — ABNORMAL HIGH (ref 36.0–46.0)
Hemoglobin: 16.7 g/dL — ABNORMAL HIGH (ref 12.0–15.0)
MCH: 26.8 pg (ref 26.0–34.0)
MCHC: 33 g/dL (ref 30.0–36.0)
MCV: 81.2 fL (ref 80.0–100.0)
Platelets: 323 10*3/uL (ref 150–400)
RBC: 6.23 MIL/uL — ABNORMAL HIGH (ref 3.87–5.11)
RDW: 15.4 % (ref 11.5–15.5)
WBC: 9.9 10*3/uL (ref 4.0–10.5)
nRBC: 0 % (ref 0.0–0.2)

## 2018-12-21 LAB — GLUCOSE, CAPILLARY: Glucose-Capillary: 200 mg/dL — ABNORMAL HIGH (ref 70–99)

## 2018-12-21 MED ORDER — NICOTINE POLACRILEX 4 MG MT GUM: 4.0000 mg | CHEWING_GUM | OROMUCOSAL | 2 refills | Status: DC | PRN

## 2018-12-21 MED ORDER — DILTIAZEM HCL ER COATED BEADS 120 MG PO CP24: 120.0000 mg | ORAL_CAPSULE | Freq: Two times a day (BID) | ORAL | 3 refills | Status: DC

## 2018-12-21 NOTE — Progress Notes (Addendum)
PCP - Dr. Boykin Nearing [facility] Cardiologist - Dr. Andree Elk LOV-11/06/2018 EPIC  Chest x-ray - 09/24/2018 EPIC EKG - 09/24/2018 EPIC Stress Test - 11/11/2017 EPIC ECHO - 10/01/2017 EPIC Cardiac Cath - N/A  Sleep Study - 2020 CPAP - yes  Fasting Blood Sugar - 158-222 Checks Blood Sugar __3___ times a day  Blood Thinner Instructions:N/A Aspirin Instructions:N/A Last Dose:N/A  Anesthesia review:  Chart given to Konrad Felix, PA to review.  Patient has history of CHF, OSA and on CPAP, DM, HTN,CKD, COPD, and Hepatitis B.  Patient denies shortness of breath, fever, cough and chest pain at PAT appointment   Patient verbalized understanding of instructions that were given to them at the PAT appointment. Patient was also instructed that they will need to review over the PAT instructions again at home before surgery.

## 2018-12-21 NOTE — Telephone Encounter (Signed)
Noted and agree. 

## 2018-12-21 NOTE — Telephone Encounter (Signed)
Patient called to see if Dr. Valentina Lucks could give her a call regarding her smoking. Please give pt a call back.

## 2018-12-21 NOTE — Telephone Encounter (Signed)
Patient contact our office requesting call back.   When I returned call, patient requested a supply of 4mg  nicotine gum to assist with planned cessation event within the next week.  She continues on varenicline 0.5mg  twice daily with food at this time.   I agreed to send prescription to [facility]. I encouraged her quit attempt and plan follow up in 7 days to assess success.

## 2018-12-22 ENCOUNTER — Telehealth: Payer: Medicare Other | Admitting: Psychology

## 2018-12-22 ENCOUNTER — Telehealth: Payer: Self-pay | Admitting: Psychology

## 2018-12-22 NOTE — Progress Notes (Signed)
Pt reported she is not in need of therapy services at this time

## 2018-12-22 NOTE — Progress Notes (Signed)
Anesthesia Chart Review   Case: [id number] Date/Time: 12/29/18 1015   Procedure: BOTOX INJECTION(100 UNITS)  WITH CYSTOSCOPY (N/A )   Anesthesia type: General   Pre-op diagnosis: URGE INCONTINENCE   Location: [place] 08 / [facility] ORS   Surgeon: Ceasar Mons, MD      DISCUSSION:53 y.o. current every day smoker (8 pack years) with h/o HLD, asthma, HTN, sleep apnea w/bipap and O2 at night, COPD, chronic diastolic heart failure, DM II, GERD, CKD Stage III, urge incontinence scheduled for above procedure 12/29/2018 with Dr. Harrell Gave [name].    Pt last seen by cardiologist, Dr. [name], 11/06/2018.  At this visit pt discussed bariatric surgery.  Per Dr. Harrell Gave She is optimized from a CV standpoint.  I will see her back in person in several months for full preoperative evaluation."  Low risk stress test 10/2017.  Echo 09/2017 with EF 55-60%, valves ok.   Discussed with Dr. Fransisco Beau.  Anticipate pt can proceed with planned procedure barring acute status change.   VS: BP (!) 153/65 (BP Location: Right Arm)   Pulse (!) 59   Temp 36.7 C (Oral)   Resp 18   Ht '5\' 10"'  (1.778 m)   Wt (!) 169.3 kg   LMP 04/10/2006   SpO2 95%   BMI 53.56 kg/m   PROVIDERS: [name], MD is PCP   [name], MD is Cardiologist  LABS: Labs reviewed: Acceptable for surgery. (all labs ordered are listed, but only abnormal results are displayed)  Labs Reviewed  GLUCOSE, CAPILLARY - Abnormal; Notable for the following components:      Result Value   Glucose-Capillary 200 (*)    All other components within normal limits  COMPREHENSIVE METABOLIC PANEL - Abnormal; Notable for the following components:   Potassium 3.2 (*)    Chloride 96 (*)    Glucose, Bld 139 (*)    BUN 30 (*)    Creatinine, Ser 1.38 (*)    GFR calc non Af Amer 43 (*)    GFR calc Af Amer 50 (*)    All other components within normal limits  CBC - Abnormal; Notable for the following components:   RBC  6.23 (*)    Hemoglobin 16.7 (*)    HCT 50.6 (*)    All other components within normal limits     IMAGES:   EKG: 09/24/2018 Rate 90 bpm Sinus rhythm  Atrial premature complex Incomplete left bundle branch block  Borderline prolonged QT interval   CV: Myocardial Perfusion Imaging 11/11/2017  Nuclear stress EF: 71%.  The left ventricular ejection fraction is hyperdynamic (>65%).  There was no ST segment deviation noted during stress.  There is a small defect of mild severity present in the apical septal location. The defect is non-reversible. No ischemia noted.  This is a low risk study.  Echo 10/01/2017 Study Conclusions  - Left ventricle: The cavity size was normal. Wall thickness was   normal. Systolic function was normal. The estimated ejection   fraction was in the range of 55% to 60%. Wall motion was normal;   there were no regional wall motion abnormalities. Doppler   parameters are consistent with abnormal left ventricular   relaxation (grade 1 diastolic dysfunction).  Past Medical History:  Diagnosis Date  . Anxiety   . Arthritis 04-10-11   hips, shoulders, back  . Asthma   . Bipolar disorder ([place])   . Cancer ([place]) 1993   cervical, no treatment done, went away per pt  .  Cardiomegaly   . Cervical dysplasia or atypia 04-10-11   '93- once dx.-got pregnant-no intervention, then postpartum, no dysplasia found  . CHF (congestive heart failure) ([place])    no cardiologist 2014 dx, none now  . Chronic hypoxemic respiratory failure ([place])   . Chronic kidney disease    stage 3 kidney disease  . 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 Pathology showed Condyloma, gluteal CONDYLOMA ACUMINATUM.   [name] Kitchen COPD (chronic obstructive pulmonary disease) ([place])   . Diabetes mellitus without complication ([place])   . Dyspnea    with actity, sitting  . Fall 12/16/2016  . Fibrocystic breast disease   . GERD (gastroesophageal reflux disease)   . Grade I  diastolic dysfunction 97/67/3419   Noted on ECHO  . Hepatitis    hep B-count is low at present,doesn't register  . History of PSVT (paroxysmal supraventricular tachycardia)   . Hypercalcemia   . Hyperlipidemia   . Hypertension   . Incomplete left bundle branch block (LBBB) 09/24/2018   Noted on EKG  . Lung nodule 11/2017   right and left lung  . Migraine headache    none recent  . Morbid obesity ([place]) 03/27/2006  . Neuropathy   . Pneumonia    walking pneu 15 yrs. ago  . Skin lesion 03/15/2011   In gluteal crease now s/p removal by Dr. [name] of General Surgery on 3/12. Path shows condyloma.     . Sleep apnea 04-10-11   uses cpap, pt does not know settings  . Sleep apnea   . Trigger finger    left third  . Urge incontinence of urine     Past Surgical History:  Procedure Laterality Date  . ANAL FISTULECTOMY  04/17/2011   Procedure: FISTULECTOMY ANAL;  Surgeon: Imogene Burn. [name], MD;  Location: [facility];  Service: General;  Laterality: N/A;  Excision of Condyloma Gluteal Cleft   . BACK SURGERY  04-10-11   x5-Lumbar fusion-retained hardware.(Dr. Louanne Skye)  . BREAST CYST EXCISION     bilateral breast, 3 cysts removed from each breast  . BREAST EXCISIONAL BIOPSY Right    x 3  . BREAST EXCISIONAL BIOPSY Left    x 3  . CARPAL TUNNEL RELEASE Bilateral   . Cervical biospy    . COLONOSCOPY WITH PROPOFOL N/A 07/06/2015   Procedure: COLONOSCOPY WITH PROPOFOL;  Surgeon: [name], MD;  [facility];  Service: Endoscopy;  Laterality: N/A;  . DOPPLER ECHOCARDIOGRAPHY  04/06/2012   AT [place] 55-60%  . FRACTURE SURGERY     little toe left foot  . I&D EXTREMITY Left 12/18/2016   Procedure: IRRIGATION AND DEBRIDEMENT LEFT FOOT, CLOSURE;  Surgeon: [name], MD;  Location: [place];  Service: Orthopedics;  Laterality: Left;  [name] Kitchen MULTIPLE TOOTH EXTRACTIONS    . NECK SURGERY  04-10-11   x3- cervical fusion with plating and screws-Dr. [name]  . TEE WITHOUT CARDIOVERSION N/A  04/06/2012   Procedure: TRANSESOPHAGEAL ECHOCARDIOGRAM (TEE);  Surgeon: [name], MD;  Location: [facility] ENDOSCOPY;  Service: Cardiovascular;  Laterality: N/A;  . TRIGGER FINGER RELEASE Right    middle finger  . TRIGGER FINGER RELEASE Left 06/28/2016   Procedure: RELEASE TRIGGER FINGER LEFT 3RD FINGER;  Surgeon: [name], MD;  Location: [place];  Service: Orthopedics;  Laterality: Left;  . TRIGGER FINGER RELEASE Right 06/12/2016   Procedure: RIGHT INDEX FINGER TRIGGER RELEASE;  Surgeon: [name], MD;  Location: [place];  Service: Orthopedics;  Laterality: Right;  . TRIGGER FINGER RELEASE Right 01/19/2018   Procedure: RIGHT RING FINGER TRIGGER FINGER RELEASE;  Surgeon: [name], MD;  Location: [place];  Service: Orthopedics;  Laterality: Right;    MEDICATIONS: . ACCU-CHEK SOFTCLIX LANCETS lancets  . acetaminophen (TYLENOL) 500 MG tablet  . ammonium lactate (LAC-HYDRIN) 12 % lotion  . atorvastatin (LIPITOR) 40 MG tablet  . b complex vitamins capsule  . baclofen (LIORESAL) 10 MG tablet  . blood glucose meter kit and supplies KIT  . busPIRone (BUSPAR) 10 MG tablet  . Calcium Citrate-Vitamin D (CALCIUM CITRATE + D3 PO)  . Capsaicin-Menthol (PAIN RELIEF EX)  . CHANTIX 0.5 MG tablet  . diclofenac Sodium (VOLTAREN) 1 % GEL  . diltiazem (CARDIZEM CD) 120 MG 24 hr capsule  . docusate sodium (COLACE) 100 MG capsule  . empagliflozin (JARDIANCE) 25 MG TABS tablet  . febuxostat (ULORIC) 40 MG tablet  . Flaxseed, Linseed, (FLAXSEED OIL) 1000 MG CAPS  . fluticasone (FLONASE) 50 MCG/ACT nasal spray  . Fluticasone-Umeclidin-Vilant (TRELEGY ELLIPTA) 100-62.5-25 MCG/INH AEPB  . folic acid (FOLVITE) 564 MCG tablet  . furosemide (LASIX) 40 MG tablet  . gabapentin (NEURONTIN) 300 MG capsule  . glucose blood (ACCU-CHEK AVIVA PLUS) test strip  . HYDROcodone-acetaminophen (NORCO) 7.5-325 MG tablet  . hydrOXYzine (ATARAX/VISTARIL) 10 MG tablet  . Insulin Glargine (LANTUS SOLOSTAR) 100 UNIT/ML  Solostar Pen  . insulin lispro (HUMALOG KWIKPEN) 100 UNIT/ML KwikPen  . Insulin Pen Needle (B-D UF III MINI PEN NEEDLES) 31G X 5 MM MISC  . ketoconazole (NIZORAL) 2 % cream  . loratadine (CLARITIN) 10 MG tablet  . losartan (COZAAR) 100 MG tablet  . metolazone (ZAROXOLYN) 2.5 MG tablet  . montelukast (SINGULAIR) 10 MG tablet  . Multiple Vitamins-Minerals (MULTIVITAMIN WITH MINERALS) tablet  . mupirocin ointment (BACTROBAN) 2 %  . NARCAN 4 MG/0.1ML LIQD nasal spray kit  . nicotine polacrilex ([facility] NICOTINE POLACRILEX) 4 MG gum  . NON FORMULARY  . nystatin (MYCOSTATIN) 100000 UNIT/ML suspension  . Omega-3 Fatty Acids (FISH OIL) 1000 MG CAPS  . omeprazole (PRILOSEC) 20 MG capsule  . potassium chloride SA (KLOR-CON M20) 20 MEQ tablet  . PROAIR HFA 108 (90 Base) MCG/ACT inhaler  . promethazine (PHENERGAN) 25 MG tablet  . Semaglutide,0.25 or 0.5MG/DOS, (OZEMPIC, 0.25 OR 0.5 MG/DOSE,) 2 MG/1.5ML SOPN  . sertraline (ZOLOFT) 25 MG tablet  . sertraline (ZOLOFT) 50 MG tablet  . TOVIAZ 8 MG TB24 tablet   No current facility-administered medications for this encounter.    . technetium tetrofosmin (TC-MYOVIEW) injection 33.2 millicurie     Maia Plan [facility] Pre-Surgical Testing [phone] 12/22/18  2:11 PM

## 2018-12-22 NOTE — Telephone Encounter (Signed)
Pt reported she has been doing a lot better with her mood and stress. Pt reported she did not need therapy at this time and would call if needing more appts.

## 2018-12-22 NOTE — Anesthesia Preprocedure Evaluation (Addendum)
Anesthesia Evaluation  Patient identified by MRN, date of birth, ID band Patient awake    Reviewed: Allergy & Precautions, H&P , NPO status , Patient's Chart, lab work & pertinent test results  Airway Mallampati: II   Neck ROM: full    Dental   Pulmonary shortness of breath, asthma , sleep apnea , COPD, Current Smoker,    breath sounds clear to auscultation       Cardiovascular hypertension, +CHF  + dysrhythmias Supra Ventricular Tachycardia  Rhythm:regular Rate:Normal  EF 55%. Valves ok.  Grade 1 diastolic disease. Up to date with cardiologist.    Neuro/Psych  Headaches, PSYCHIATRIC DISORDERS Anxiety Depression Bipolar Disorder    GI/Hepatic GERD  ,  Endo/Other  diabetes, Type 2Morbid obesity  Renal/GU Renal InsufficiencyRenal disease     Musculoskeletal  (+) Arthritis ,   Abdominal   Peds  Hematology   Anesthesia Other Findings   Reproductive/Obstetrics                            Anesthesia Physical Anesthesia Plan  ASA: III  Anesthesia Plan: General   Post-op Pain Management:    Induction: Intravenous  PONV Risk Score and Plan: 2 and Ondansetron, Dexamethasone and Treatment may vary due to age or medical condition  Airway Management Planned: LMA  Additional Equipment:   Intra-op Plan:   Post-operative Plan:   Informed Consent: I have reviewed the patients History and Physical, chart, labs and discussed the procedure including the risks, benefits and alternatives for the proposed anesthesia with the patient or authorized representative who has indicated his/her understanding and acceptance.       Plan Discussed with: CRNA, Anesthesiologist and Surgeon  Anesthesia Plan Comments: (See PAT note 12/21/2018, Konrad Felix, PA-C)       Anesthesia Quick Evaluation

## 2018-12-25 ENCOUNTER — Other Ambulatory Visit (HOSPITAL_COMMUNITY)
Admission: RE | Admit: 2018-12-25 | Discharge: 2018-12-25 | Disposition: A | Discharge status: Home / Self Care | Payer: Medicare Other | Source: Ambulatory Visit | Attending: Urology | Admitting: Urology

## 2018-12-25 DIAGNOSIS — Z01812 Encounter for preprocedural laboratory examination: Secondary | ICD-10-CM | POA: Diagnosis not present

## 2018-12-25 DIAGNOSIS — Z20828 Contact with and (suspected) exposure to other viral communicable diseases: Secondary | ICD-10-CM | POA: Diagnosis not present

## 2018-12-25 LAB — SARS CORONAVIRUS 2 (TAT 6-24 HRS): SARS Coronavirus 2: NEGATIVE

## 2018-12-28 ENCOUNTER — Other Ambulatory Visit: Payer: Self-pay | Admitting: Student in an Organized Health Care Education/Training Program

## 2018-12-28 ENCOUNTER — Ambulatory Visit
Admission: RE | Admit: 2018-12-28 | Discharge: 2018-12-28 | Disposition: A | Discharge status: Home / Self Care | Payer: Medicare Other | Source: Ambulatory Visit | Attending: Specialist | Admitting: Specialist

## 2018-12-28 ENCOUNTER — Other Ambulatory Visit: Payer: Self-pay

## 2018-12-28 ENCOUNTER — Encounter: Payer: Self-pay | Admitting: Family Medicine

## 2018-12-28 DIAGNOSIS — M25511 Pain in right shoulder: Secondary | ICD-10-CM

## 2018-12-28 DIAGNOSIS — M75112 Incomplete rotator cuff tear or rupture of left shoulder, not specified as traumatic: Secondary | ICD-10-CM | POA: Diagnosis not present

## 2018-12-29 ENCOUNTER — Ambulatory Visit (HOSPITAL_COMMUNITY)
Admission: RE | Admit: 2018-12-29 | Discharge: 2018-12-29 | Disposition: A | Discharge status: Home / Self Care | Payer: Medicare Other | Attending: Urology | Admitting: Urology

## 2018-12-29 ENCOUNTER — Other Ambulatory Visit: Payer: Self-pay

## 2018-12-29 ENCOUNTER — Ambulatory Visit (HOSPITAL_COMMUNITY): Payer: Medicare Other | Admitting: Physician Assistant

## 2018-12-29 ENCOUNTER — Encounter (HOSPITAL_COMMUNITY)
Admission: RE | Disposition: A | Discharge status: Home / Self Care | Payer: Self-pay | Source: Home / Self Care | Attending: Urology

## 2018-12-29 ENCOUNTER — Encounter (HOSPITAL_COMMUNITY): Payer: Self-pay | Admitting: Emergency Medicine

## 2018-12-29 ENCOUNTER — Ambulatory Visit (HOSPITAL_COMMUNITY): Payer: Medicare Other | Admitting: Certified Registered Nurse Anesthetist

## 2018-12-29 DIAGNOSIS — G834 Cauda equina syndrome: Secondary | ICD-10-CM | POA: Diagnosis not present

## 2018-12-29 DIAGNOSIS — Z6841 Body Mass Index (BMI) 40.0 and over, adult: Secondary | ICD-10-CM | POA: Insufficient documentation

## 2018-12-29 DIAGNOSIS — N3941 Urge incontinence: Secondary | ICD-10-CM | POA: Insufficient documentation

## 2018-12-29 DIAGNOSIS — Z886 Allergy status to analgesic agent status: Secondary | ICD-10-CM | POA: Diagnosis not present

## 2018-12-29 DIAGNOSIS — E1122 Type 2 diabetes mellitus with diabetic chronic kidney disease: Secondary | ICD-10-CM | POA: Insufficient documentation

## 2018-12-29 DIAGNOSIS — K219 Gastro-esophageal reflux disease without esophagitis: Secondary | ICD-10-CM | POA: Diagnosis not present

## 2018-12-29 DIAGNOSIS — E785 Hyperlipidemia, unspecified: Secondary | ICD-10-CM | POA: Diagnosis not present

## 2018-12-29 DIAGNOSIS — Z881 Allergy status to other antibiotic agents status: Secondary | ICD-10-CM | POA: Diagnosis not present

## 2018-12-29 DIAGNOSIS — Z811 Family history of alcohol abuse and dependence: Secondary | ICD-10-CM | POA: Insufficient documentation

## 2018-12-29 DIAGNOSIS — N183 Chronic kidney disease, stage 3 unspecified: Secondary | ICD-10-CM | POA: Insufficient documentation

## 2018-12-29 DIAGNOSIS — F419 Anxiety disorder, unspecified: Secondary | ICD-10-CM | POA: Insufficient documentation

## 2018-12-29 DIAGNOSIS — Z803 Family history of malignant neoplasm of breast: Secondary | ICD-10-CM | POA: Insufficient documentation

## 2018-12-29 DIAGNOSIS — Z825 Family history of asthma and other chronic lower respiratory diseases: Secondary | ICD-10-CM | POA: Insufficient documentation

## 2018-12-29 DIAGNOSIS — J9611 Chronic respiratory failure with hypoxia: Secondary | ICD-10-CM | POA: Insufficient documentation

## 2018-12-29 DIAGNOSIS — E7800 Pure hypercholesterolemia, unspecified: Secondary | ICD-10-CM | POA: Diagnosis not present

## 2018-12-29 DIAGNOSIS — J449 Chronic obstructive pulmonary disease, unspecified: Secondary | ICD-10-CM | POA: Diagnosis not present

## 2018-12-29 DIAGNOSIS — Z888 Allergy status to other drugs, medicaments and biological substances status: Secondary | ICD-10-CM | POA: Insufficient documentation

## 2018-12-29 DIAGNOSIS — F319 Bipolar disorder, unspecified: Secondary | ICD-10-CM | POA: Diagnosis not present

## 2018-12-29 DIAGNOSIS — Z8249 Family history of ischemic heart disease and other diseases of the circulatory system: Secondary | ICD-10-CM | POA: Insufficient documentation

## 2018-12-29 DIAGNOSIS — I447 Left bundle-branch block, unspecified: Secondary | ICD-10-CM | POA: Insufficient documentation

## 2018-12-29 DIAGNOSIS — Z832 Family history of diseases of the blood and blood-forming organs and certain disorders involving the immune mechanism: Secondary | ICD-10-CM | POA: Insufficient documentation

## 2018-12-29 DIAGNOSIS — Z8261 Family history of arthritis: Secondary | ICD-10-CM | POA: Insufficient documentation

## 2018-12-29 DIAGNOSIS — I130 Hypertensive heart and chronic kidney disease with heart failure and stage 1 through stage 4 chronic kidney disease, or unspecified chronic kidney disease: Secondary | ICD-10-CM | POA: Insufficient documentation

## 2018-12-29 DIAGNOSIS — F17210 Nicotine dependence, cigarettes, uncomplicated: Secondary | ICD-10-CM | POA: Diagnosis not present

## 2018-12-29 DIAGNOSIS — I509 Heart failure, unspecified: Secondary | ICD-10-CM | POA: Insufficient documentation

## 2018-12-29 DIAGNOSIS — Z818 Family history of other mental and behavioral disorders: Secondary | ICD-10-CM | POA: Insufficient documentation

## 2018-12-29 DIAGNOSIS — N6019 Diffuse cystic mastopathy of unspecified breast: Secondary | ICD-10-CM | POA: Diagnosis not present

## 2018-12-29 DIAGNOSIS — N3944 Nocturnal enuresis: Secondary | ICD-10-CM | POA: Diagnosis not present

## 2018-12-29 DIAGNOSIS — E1140 Type 2 diabetes mellitus with diabetic neuropathy, unspecified: Secondary | ICD-10-CM | POA: Insufficient documentation

## 2018-12-29 DIAGNOSIS — Z823 Family history of stroke: Secondary | ICD-10-CM | POA: Insufficient documentation

## 2018-12-29 DIAGNOSIS — G4730 Sleep apnea, unspecified: Secondary | ICD-10-CM | POA: Diagnosis not present

## 2018-12-29 HISTORY — PX: BOTOX INJECTION: SHX5754

## 2018-12-29 LAB — GLUCOSE, CAPILLARY
Glucose-Capillary: 171 mg/dL — ABNORMAL HIGH (ref 70–99)
Glucose-Capillary: 148 mg/dL — ABNORMAL HIGH (ref 70–99)

## 2018-12-29 SURGERY — BOTOX INJECTION
Anesthesia: General

## 2018-12-29 MED ORDER — LIDOCAINE 2% (20 MG/ML) 5 ML SYRINGE
INTRAMUSCULAR | Status: DC | PRN
  Administered 2018-12-29: 60 mg via INTRAVENOUS

## 2018-12-29 MED ORDER — SODIUM CHLORIDE (PF) 0.9 % IJ SOLN
INTRAMUSCULAR | Status: AC
  Filled 2018-12-29: qty 10

## 2018-12-29 MED ORDER — ONDANSETRON HCL 4 MG/2ML IJ SOLN: 4.0000 mg | Freq: Four times a day (QID) | INTRAMUSCULAR | Status: DC | PRN

## 2018-12-29 MED ORDER — FENTANYL CITRATE (PF) 100 MCG/2ML IJ SOLN
INTRAMUSCULAR | Status: DC | PRN
  Administered 2018-12-29: 50 ug via INTRAVENOUS
  Administered 2018-12-29 (×2): 25 ug via INTRAVENOUS

## 2018-12-29 MED ORDER — SODIUM CHLORIDE (PF) 0.9 % IJ SOLN
INTRAMUSCULAR | Status: DC | PRN
  Administered 2018-12-29: 10 mL

## 2018-12-29 MED ORDER — LORATADINE 10 MG PO TABS: 10.0000 mg | ORAL_TABLET | Freq: Every day | ORAL | 0 refills | Status: DC

## 2018-12-29 MED ORDER — MIDAZOLAM HCL 2 MG/2ML IJ SOLN
INTRAMUSCULAR | Status: DC | PRN
  Administered 2018-12-29: 2 mg via INTRAVENOUS

## 2018-12-29 MED ORDER — PROPOFOL 10 MG/ML IV BOLUS
INTRAVENOUS | Status: AC
  Filled 2018-12-29: qty 20

## 2018-12-29 MED ORDER — ONDANSETRON HCL 4 MG/2ML IJ SOLN
INTRAMUSCULAR | Status: AC
  Filled 2018-12-29: qty 2

## 2018-12-29 MED ORDER — LIDOCAINE 2% (20 MG/ML) 5 ML SYRINGE
INTRAMUSCULAR | Status: AC
  Filled 2018-12-29: qty 5

## 2018-12-29 MED ORDER — ONABOTULINUMTOXINA 100 UNITS IJ SOLR
INTRAMUSCULAR | Status: DC | PRN
  Administered 2018-12-29: 100 [IU] via INTRAMUSCULAR

## 2018-12-29 MED ORDER — PROPOFOL 500 MG/50ML IV EMUL
INTRAVENOUS | Status: DC | PRN
  Administered 2018-12-29: 170 mg via INTRAVENOUS

## 2018-12-29 MED ORDER — DEXAMETHASONE SODIUM PHOSPHATE 10 MG/ML IJ SOLN
INTRAMUSCULAR | Status: AC
  Filled 2018-12-29: qty 1

## 2018-12-29 MED ORDER — MIDAZOLAM HCL 2 MG/2ML IJ SOLN
INTRAMUSCULAR | Status: AC
  Filled 2018-12-29: qty 2

## 2018-12-29 MED ORDER — LACTATED RINGERS IV SOLN
INTRAVENOUS | Status: DC
  Administered 2018-12-29: 10:00:00 via INTRAVENOUS

## 2018-12-29 MED ORDER — FENTANYL CITRATE (PF) 100 MCG/2ML IJ SOLN: 25.0000 ug | INTRAMUSCULAR | Status: DC | PRN

## 2018-12-29 MED ORDER — CEFAZOLIN SODIUM-DEXTROSE 2-4 GM/100ML-% IV SOLN
2.0000 g | Freq: Once | INTRAVENOUS | Status: DC
  Filled 2018-12-29: qty 100

## 2018-12-29 MED ORDER — OXYCODONE HCL 5 MG PO TABS: 5.0000 mg | ORAL_TABLET | Freq: Once | ORAL | Status: DC | PRN

## 2018-12-29 MED ORDER — DEXAMETHASONE SODIUM PHOSPHATE 10 MG/ML IJ SOLN
INTRAMUSCULAR | Status: DC | PRN
  Administered 2018-12-29: 5 mg via INTRAVENOUS

## 2018-12-29 MED ORDER — ONABOTULINUMTOXINA 100 UNITS IJ SOLR
INTRAMUSCULAR | Status: AC
  Filled 2018-12-29: qty 100

## 2018-12-29 MED ORDER — ONDANSETRON HCL 4 MG/2ML IJ SOLN
INTRAMUSCULAR | Status: DC | PRN
  Administered 2018-12-29: 4 mg via INTRAVENOUS

## 2018-12-29 MED ORDER — PHENAZOPYRIDINE HCL 200 MG PO TABS: 200.0000 mg | ORAL_TABLET | Freq: Three times a day (TID) | ORAL | 0 refills | Status: DC | PRN

## 2018-12-29 MED ORDER — OXYCODONE HCL 5 MG/5ML PO SOLN: 5.0000 mg | Freq: Once | ORAL | Status: DC | PRN

## 2018-12-29 MED ORDER — CEPHALEXIN 500 MG PO CAPS: 500.0000 mg | ORAL_CAPSULE | Freq: Two times a day (BID) | ORAL | 0 refills | Status: AC

## 2018-12-29 MED ORDER — SODIUM CHLORIDE 0.9 % IR SOLN
Status: DC | PRN
  Administered 2018-12-29: 3000 mL via INTRAVESICAL

## 2018-12-29 MED ORDER — FENTANYL CITRATE (PF) 100 MCG/2ML IJ SOLN
INTRAMUSCULAR | Status: AC
  Filled 2018-12-29: qty 2

## 2018-12-29 SURGICAL SUPPLY — 18 items
BAG URO CATCHER STRL LF (MISCELLANEOUS) ×2 IMPLANT
CATH ROBINSON RED A/P 14FR (CATHETERS) IMPLANT
CLOTH BEACON ORANGE TIMEOUT ST (SAFETY) ×2 IMPLANT
ELECT REM PT RETURN 15FT ADLT (MISCELLANEOUS) ×2 IMPLANT
GLOVE BIO SURGEON STRL SZ7.5 (GLOVE) ×2 IMPLANT
GOWN STRL REUS W/ TWL XL LVL3 (GOWN DISPOSABLE) ×3 IMPLANT
GOWN STRL REUS W/TWL XL LVL3 (GOWN DISPOSABLE) ×6
KIT TURNOVER KIT A (KITS) ×2 IMPLANT
MANIFOLD NEPTUNE II (INSTRUMENTS) ×2 IMPLANT
NDL ASPIRATION 22 (NEEDLE) IMPLANT
NDL SAFETY ECLIPSE 18X1.5 (NEEDLE) ×1 IMPLANT
NEEDLE ASPIRATION 22 (NEEDLE) IMPLANT
NEEDLE HYPO 18GX1.5 SHARP (NEEDLE) ×2
PACK CYSTO (CUSTOM PROCEDURE TRAY) ×2 IMPLANT
SYR 20ML LL LF (SYRINGE) IMPLANT
SYR CONTROL 10ML LL (SYRINGE) IMPLANT
TUBING CONNECTING 10 (TUBING) ×2 IMPLANT
WATER STERILE IRR 3000ML UROMA (IV SOLUTION) ×2 IMPLANT

## 2018-12-29 NOTE — Anesthesia Procedure Notes (Signed)
Procedure Name: LMA Insertion Date/Time: 12/29/2018 10:16 AM Performed by: Gerald Leitz, CRNA Pre-anesthesia Checklist: Patient identified, Patient being monitored, Timeout performed, Emergency Drugs available and Suction available Patient Re-evaluated:Patient Re-evaluated prior to induction Oxygen Delivery Method: Circle system utilized Preoxygenation: Pre-oxygenation with 100% oxygen Induction Type: IV induction Ventilation: Mask ventilation without difficulty LMA: LMA inserted LMA Size: 4.0 Tube type: Oral Number of attempts: 1 Placement Confirmation: positive ETCO2 and breath sounds checked- equal and bilateral Tube secured with: Tape Dental Injury: Teeth and Oropharynx as per pre-operative assessment

## 2018-12-29 NOTE — Discharge Instructions (Signed)
General Anesthesia, Adult, Care After °This sheet gives you information about how to care for yourself after your procedure. Your health care provider may also give you more specific instructions. If you have problems or questions, contact your health care provider. °What can I expect after the procedure? °After the procedure, the following side effects are common: °· Pain or discomfort at the IV site. °· Nausea. °· Vomiting. °· Sore throat. °· Trouble concentrating. °· Feeling cold or chills. °· Weak or tired. °· Sleepiness and fatigue. °· Soreness and body aches. These side effects can affect parts of the body that were not involved in surgery. °Follow these instructions at home: ° °For at least 24 hours after the procedure: °· Have a responsible adult stay with you. It is important to have someone help care for you until you are awake and alert. °· Rest as needed. °· Do not: °? Participate in activities in which you could fall or become injured. °? Drive. °? Use heavy machinery. °? Drink alcohol. °? Take sleeping pills or medicines that cause drowsiness. °? Make important decisions or sign legal documents. °? Take care of children on your own. °Eating and drinking °· Follow any instructions from your health care provider about eating or drinking restrictions. °· When you feel hungry, start by eating small amounts of foods that are soft and easy to digest (bland), such as toast. Gradually return to your regular diet. °· Drink enough fluid to keep your urine pale yellow. °· If you vomit, rehydrate by drinking water, juice, or clear broth. °General instructions °· If you have sleep apnea, surgery and certain medicines can increase your risk for breathing problems. Follow instructions from your health care provider about wearing your sleep device: °? Anytime you are sleeping, including during daytime naps. °? While taking prescription pain medicines, sleeping medicines, or medicines that make you drowsy. °· Return to  your normal activities as told by your health care provider. Ask your health care provider what activities are safe for you. °· Take over-the-counter and prescription medicines only as told by your health care provider. °· If you smoke, do not smoke without supervision. °· Keep all follow-up visits as told by your health care provider. This is important. °Contact a health care provider if: °· You have nausea or vomiting that does not get better with medicine. °· You cannot eat or drink without vomiting. °· You have pain that does not get better with medicine. °· You are unable to pass urine. °· You develop a skin rash. °· You have a fever. °· You have redness around your IV site that gets worse. °Get help right away if: °· You have difficulty breathing. °· You have chest pain. °· You have blood in your urine or stool, or you vomit blood. °Summary °· After the procedure, it is common to have a sore throat or nausea. It is also common to feel tired. °· Have a responsible adult stay with you for the first 24 hours after general anesthesia. It is important to have someone help care for you until you are awake and alert. °· When you feel hungry, start by eating small amounts of foods that are soft and easy to digest (bland), such as toast. Gradually return to your regular diet. °· Drink enough fluid to keep your urine pale yellow. °· Return to your normal activities as told by your health care provider. Ask your health care provider what activities are safe for you. °This information is not   intended to replace advice given to you by your health care provider. Make sure you discuss any questions you have with your health care provider. °Document Released: 04/22/2000 Document Revised: 01/17/2017 Document Reviewed: 08/30/2016 °Elsevier Patient Education © 2020 [employer] ° °

## 2018-12-29 NOTE — Op Note (Signed)
Operative Note  Preoperative diagnosis:  1.  Refractory urge incontinence  Postoperative diagnosis: 1.  Same  Procedure(s): 1.  Cystoscopy with bladder Botox injections (100 units)  Surgeon: Ellison Hughs, MD  Assistants:  None  Anesthesia:  General  Complications:  None  EBL: Less than 5 mL  Specimens: 1.  None  Drains/Catheters: 1.  None  Intraoperative findings:   1. Normal urethra and bladder mucosa seen during cystoscopy  Indication:  [name] is a 53 y.o. female with refractory urge incontinence despite multiple anticholinergic medications.  She has been consented for the above procedures, voices understanding and wishes to proceed.  Description of procedure:  After informed consent was obtained, the patient was brought to the operating room and general LMA anesthesia was administered. The patient was then placed in the dorsolithotomy position and prepped and draped in the usual sterile fashion. A timeout was performed. A 23 French rigid cystoscope was then inserted into the urethral meatus and advanced into the bladder under direct vision. A complete bladder survey revealed no intravesical pathology.  I then injected a total of 100 units of Botox in 0.5 mL aliquots throughout the detrusor musculature, and a grid like fashion.  There was no evidence of bleeding following the injections.  The patient's bladder was then partially drained.  She tolerated the procedure well and was transferred to the postanesthesia in stable condition.  Plan: Follow-up on 01/14/19 for postop visit and PVR.

## 2018-12-29 NOTE — Transfer of Care (Signed)
Immediate Anesthesia Transfer of Care Note  Patient: [name]  Procedure(s) Performed: BOTOX INJECTION(100 UNITS)  WITH CYSTOSCOPY (N/A )  Patient Location: PACU  Anesthesia Type:General  Level of Consciousness: awake, alert  and oriented  Airway & Oxygen Therapy: Patient Spontanous Breathing and Patient connected to face mask oxygen  Post-op Assessment: Report given to RN and Post -op Vital signs reviewed and stable  Post vital signs: Reviewed and stable  Last Vitals:  Vitals Value Taken Time  BP    Temp    Pulse 82 12/29/18 1046  Resp    SpO2 95 % 12/29/18 1046  Vitals shown include unvalidated device data.  Last Pain:  Vitals:   12/29/18 0928  TempSrc:   PainSc: 10-Worst pain ever      Patients Stated Pain Goal: 4 (15/18/34 3735)  Complications: No apparent anesthesia complications

## 2018-12-29 NOTE — H&P (Signed)
Urology Preoperative H&P   Chief Complaint: Urge incontinence  History of Present Illness: [name] is a 53 y.o. female with a history of cauda equina syndrome, a neurogenic bladder, urge incontinence and nocturnal enuresis.  Despite multiple anti-cholinergic medications, she continues to struggle with urge incontinence and nocturnal enuresis.  She denies interval UTIs, dysuria or hematuria.    UDS: Ms. Deisher held a max capacity of approx. 300 mls. Her 1st sensation was felt at 176 mls. No SUI was noted. There was positive low amplitude instability. She did feel an increased urge but was able to inhibit leaking. She was able to generate a voluntary contraction and void. PVR was approx.100 mls. No reflux was seen.   Past Medical History:  Diagnosis Date  . Anxiety   . Arthritis 04-10-11   hips, shoulders, back  . Asthma   . Bipolar disorder ([place])   . Cancer ([place]) 1993   cervical, no treatment done, went away per pt  . Cardiomegaly   . Cervical dysplasia or atypia 04-10-11   '93- once dx.-got pregnant-no intervention, then postpartum, no dysplasia found  . CHF (congestive heart failure) ([place])    no cardiologist 2014 dx, none now  . Chronic hypoxemic respiratory failure ([place])   . Chronic kidney disease    stage 3 kidney disease  . 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 Pathology showed Condyloma, gluteal CONDYLOMA ACUMINATUM.   [name] Kitchen COPD (chronic obstructive pulmonary disease) ([place])   . Diabetes mellitus without complication ([place])   . Dyspnea    with actity, sitting  . Fall 12/16/2016  . Fibrocystic breast disease   . GERD (gastroesophageal reflux disease)   . Grade I diastolic dysfunction 29/92/4268   Noted on ECHO  . Hepatitis    hep B-count is low at present,doesn't register  . History of PSVT (paroxysmal supraventricular tachycardia)   . Hypercalcemia   . Hyperlipidemia   . Hypertension   . Incomplete left bundle branch block (LBBB) 09/24/2018    Noted on EKG  . Lung nodule 11/2017   right and left lung  . Migraine headache    none recent  . Morbid obesity ([place]) 03/27/2006  . Neuropathy   . Pneumonia    walking pneu 15 yrs. ago  . Skin lesion 03/15/2011   In gluteal crease now s/p removal by Dr. [name] of General Surgery on 3/12. Path shows condyloma.     . Sleep apnea 04-10-11   uses cpap, pt does not know settings  . Sleep apnea   . Trigger finger    left third  . Urge incontinence of urine     Past Surgical History:  Procedure Laterality Date  . ANAL FISTULECTOMY  04/17/2011   Procedure: FISTULECTOMY ANAL;  Surgeon: Imogene Burn. [name], MD;  Location: [facility];  Service: General;  Laterality: N/A;  Excision of Condyloma Gluteal Cleft   . BACK SURGERY  04-10-11   x5-Lumbar fusion-retained hardware.(Dr. Louanne Skye)  . BREAST CYST EXCISION     bilateral breast, 3 cysts removed from each breast  . BREAST EXCISIONAL BIOPSY Right    x 3  . BREAST EXCISIONAL BIOPSY Left    x 3  . CARPAL TUNNEL RELEASE Bilateral   . Cervical biospy    . COLONOSCOPY WITH PROPOFOL N/A 07/06/2015   Procedure: COLONOSCOPY WITH PROPOFOL;  Surgeon: [name], MD;  Location: [facility];  Service: Endoscopy;  Laterality: N/A;  . DOPPLER ECHOCARDIOGRAPHY  04/06/2012   AT [facility] 55-60%  . FRACTURE SURGERY     little toe left foot  . I&D EXTREMITY Left 12/18/2016   Procedure: IRRIGATION AND DEBRIDEMENT LEFT FOOT, CLOSURE;  Surgeon: [name], MD;  Location: [facility];  Service: Orthopedics;  Laterality: Left;  [name] Kitchen MULTIPLE TOOTH EXTRACTIONS    . NECK SURGERY  04-10-11   x3- cervical fusion with plating and screws-Dr. [name]  . TEE WITHOUT CARDIOVERSION N/A 04/06/2012   Procedure: TRANSESOPHAGEAL ECHOCARDIOGRAM (TEE);  Surgeon: [name], MD;  Location: [facility] END[facility]COPY;  Service: Cardiovascular;  Laterality: N/A;  . TRIGGER FINGER RELEASE Right    middle finger  . TRIGGER FINGER RELEASE Left 06/28/2016   Procedure: RELEASE TRIGGER FINGER LEFT 3RD  FINGER;  Surgeon: [name], MD;  Location: [facility];  Service: Orthopedics;  Laterality: Left;  . TRIGGER FINGER RELEASE Right 06/12/2016   Procedure: RIGHT INDEX FINGER TRIGGER RELEASE;  Surgeon: [name], MD;  Location: [facility];  Service: Orthopedics;  Laterality: Right;  . TRIGGER FINGER RELEASE Right 01/19/2018   Procedure: RIGHT RING FINGER TRIGGER FINGER RELEASE;  Surgeon: [name], MD;  Location: [facility];  Service: Orthopedics;  Laterality: Right;    Allergies:  Allergies  Allergen Reactions  . Aspirin Other (See Comments)    Kidney disease  . Chantix [Varenicline Tartrate] Other (See Comments)    On the 1 mg dose, patient experienced hallucintation  . 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 Corticosteroids Other (See Comments)    Hyperglycemia   . Levofloxacin Itching    Family History  Problem Relation Age of Onset  . Pulmonary embolism Mother   . Stroke Father   . Alcohol abuse Father   . Pneumonia Father   . Cancer Maternal Aunt        breast  . Breast cancer Maternal Aunt   . Asthma Other   . COPD Other   . Hypertension Other   . Diabetes Other   . Asthma Sister   . Depression Sister   . Arthritis Sister   . Sickle cell anemia Son     Social History:  reports that she has been smoking cigarettes. She started smoking about 41 years ago. She has a 8.00 pack-year smoking history. She has never used smokeless tobacco. She reports previous drug use. Drugs: Marijuana and "Crack" cocaine. She reports that she does not drink alcohol.  ROS: A complete review of systems was performed.  All systems are negative except for pertinent findings as noted.  Physical Exam:  Vital signs in last 24 hours:   Constitutional:  Alert and oriented, No acute distress Cardiovascular: Regular rate and rhythm, No JVD Respiratory: Normal respiratory effort, Lungs clear bilaterally GI: Abdomen is soft, nontender, nondistended, no abdominal masses GU:  No CVA tenderness Lymphatic: No lymphadenopathy Neurologic: Grossly intact, no focal deficits Psychiatric: Normal mood and affect  Laboratory Data:  No results for input(s): WBC, HGB, HCT, PLT in the last 72 hours.  No results for input(s): NA, K, CL, GLUCOSE, BUN, CALCIUM, CREATININE in the last 72 hours.  Invalid input(s): CO3   No results found. However, due to the size of the patient record, not all encounters were searched. Please check Results Review for a complete set of results. Recent Results (from the past 240 hour(s))  SARS CORONAVIRUS 2 (TAT 6-24 HRS) Nasopharyngeal Nasopharyngeal Swab     Status: None   Collection Time: 12/25/18  8:38 AM  Specimen: Nasopharyngeal Swab  Result Value Ref Range Status   SARS Coronavirus 2 NEGATIVE NEGATIVE Final    Comment: (NOTE) SARS-CoV-2 target nucleic acids are NOT DETECTED. The SARS-CoV-2 RNA is generally detectable in upper and lower respiratory specimens during the acute phase of infection. Negative results do not preclude SARS-CoV-2 infection, do not rule out co-infections with other pathogens, and should not be used as the sole basis for treatment or other patient management decisions. Negative results must be combined with clinical observations, patient history, and epidemiological information. The expected result is Negative. Fact Sheet for Patients: SugarRoll.be Fact Sheet for Healthcare Providers: https://www.woods-mathews.com/ This test is not yet approved or cleared by the [place] FDA and  has been authorized for detection and/or diagnosis of SARS-CoV-2 by FDA under an Emergency Use Authorization (EUA). This EUA will remain  in effect (meaning this test can be used) for the duration of the COVID-19 declaration under Section 56 4(b)(1) of the Act, 21 U.S.C. section 360bbb-3(b)(1), unless the authorization is terminated or revoked sooner. Performed at [facility], [place] [address] [place], [place] [address]     Renal Function: No results for input(s): CREATININE in the last 168 hours. Estimated Creatinine Clearance: 79.1 mL/min (A) (by C-G formula based on SCr of 1.38 mg/dL (H)).  Radiologic Imaging: Mr Shoulder Left W/o Contrast  Result Date: 12/28/2018 CLINICAL DATA:  Left shoulder pain, weakness and limited range of motion EXAM: MRI OF THE LEFT SHOULDER WITHOUT CONTRAST TECHNIQUE: Multiplanar, multisequence MR imaging of the shoulder was performed. No intravenous contrast was administered. COMPARISON:  11/04/2018 FINDINGS: Rotator cuff: Supraspinatus tendinosis with high-grade partial thickness articular sided tear of the anterior to mid insertional fibers (series 7, image 13). Tear involves approximately 75% of the tendon depth in measures up to 12 mm in AP dimension. Infraspinatus tendinosis without discrete tear. Subscapularis tendinosis with high-grade partial-thickness tearing of the superior to mid aspects of the distal tendon. Intact teres minor. Muscles: No atrophy or abnormal signal of the muscles of the rotator cuff. Biceps long head: Intra-articular biceps tendinosis. Evaluation is slightly degraded by internal rotation. Acromioclavicular Joint: Mild-moderate arthropathy of the acromioclavicular joint. Small volume subacromial-subdeltoid bursal fluid. Glenohumeral Joint: No joint effusion. No chondral defect. Labrum: Grossly intact, but evaluation is limited by lack of intraarticular fluid. Bones: Subcortical cystic change at the greater tuberosity. No fracture, dislocation, or marrow replacing lesion. Other: None. IMPRESSION: 1. Tendinosis with high-grade partial thickness tears of the supraspinatus and subscapularis tendons. 2. Intra-articular biceps tendinosis. 3. Subacromial-subdeltoid bursitis. 4. Mild-moderate acromioclavicular osteoarthritis. Electronically Signed   By: Davina Poke M.D.   On: 12/28/2018 08:46    I independently  reviewed the above imaging studies.  Assessment and Plan CLOVA MORLOCK is a 53 y.o. female with refractory urge incontinence   -The risks, benefits and alternatives to cystoscopy and botox bladder injections was discussed with the patient. Risks include, but are not limited to bleeding, UTI, urinary retention, the need to self catheterize, MI, CVA, DVT, PE and the inherent risks associated with general anesthesia. She voices understanding and wishes to proceed.   Ellison Hughs, MD 12/29/2018, 6:29 AM  [facility] Pager: [phone]

## 2018-12-30 ENCOUNTER — Encounter (HOSPITAL_COMMUNITY): Payer: Self-pay | Admitting: Urology

## 2018-12-30 ENCOUNTER — Telehealth: Payer: Self-pay | Admitting: Pharmacist

## 2018-12-30 ENCOUNTER — Ambulatory Visit (INDEPENDENT_AMBULATORY_CARE_PROVIDER_SITE_OTHER): Payer: Medicare Other | Admitting: Orthopaedic Surgery

## 2018-12-30 ENCOUNTER — Institutional Professional Consult (permissible substitution): Payer: Medicare Other | Admitting: Neurology

## 2018-12-30 ENCOUNTER — Ambulatory Visit: Payer: Self-pay

## 2018-12-30 DIAGNOSIS — M79672 Pain in left foot: Secondary | ICD-10-CM

## 2018-12-30 DIAGNOSIS — M5136 Other intervertebral disc degeneration, lumbar region: Secondary | ICD-10-CM | POA: Diagnosis not present

## 2018-12-30 DIAGNOSIS — M75102 Unspecified rotator cuff tear or rupture of left shoulder, not specified as traumatic: Secondary | ICD-10-CM | POA: Diagnosis not present

## 2018-12-30 DIAGNOSIS — G894 Chronic pain syndrome: Secondary | ICD-10-CM | POA: Diagnosis not present

## 2018-12-30 DIAGNOSIS — Z79899 Other long term (current) drug therapy: Secondary | ICD-10-CM | POA: Diagnosis not present

## 2018-12-30 NOTE — Anesthesia Postprocedure Evaluation (Signed)
Anesthesia Post Note  Patient: [name]  Procedure(s) Performed: BOTOX INJECTION(100 UNITS)  WITH CYSTOSCOPY (N/A )     Patient location during evaluation: PACU Anesthesia Type: General Level of consciousness: awake and alert Pain management: pain level controlled Vital Signs Assessment: post-procedure vital signs reviewed and stable Respiratory status: spontaneous breathing, nonlabored ventilation, respiratory function stable and patient connected to nasal cannula oxygen Cardiovascular status: blood pressure returned to baseline and stable Postop Assessment: no apparent nausea or vomiting Anesthetic complications: no    Last Vitals:  Vitals:   12/29/18 1115 12/29/18 1151  BP: (!) 146/80 (!) 149/93  Pulse: 60 86  Resp: 18   Temp:  (!) 36.3 C  SpO2: 95% 95%    Last Pain:  Vitals:   12/29/18 1151  TempSrc:   PainSc: 10-Worst pain ever                 Noheli Melder S

## 2018-12-30 NOTE — Telephone Encounter (Signed)
Phone f/u to tobacco cessation - quit date 12/27/2018  Patient reported no smoking since Sunday (4 days).  She also reported her surgery went well.  As she was at the doctors office when I called we agreed I would follow-up in 1 week.    I encouraged her continued success.

## 2018-12-30 NOTE — Progress Notes (Signed)
Office Visit Note   Patient: [name]           Date of Birth: 05-25-1965           MRN: [id number] Visit Date: 12/30/2018              Requested by: [name], MD [address],  [place] [address] PCP: [name], MD   Assessment & Plan: Visit Diagnoses:  1. Pain in left foot   2. Nontraumatic tear of left supraspinatus tendon     Plan: Monitor left foot reassurance was provided that the fracture is healed fine.  I do not see any acute abnormalities.  I think the popping that she is feeling is likely due to posttraumatic arthrosis of the small toe.  I recommended getting wider shoes to accommodate her foot.  For the left shoulder the MRI shows high-grade tendinosis of the supraspinatus mainly on the articular surface.  This involves approximately 75% of the thickness of the tendon.  She has tendinosis of the subscapularis as well.  This point patient has failed conservative treatment.  Based on discussion she would like to proceed with arthroscopic repair and debridement.  We are still waiting for her A1c to get to less than 7.5 before we can proceed with surgery.  She has a repeat A1c  Follow-Up Instructions: Return if symptoms worsen or fail to improve.   Orders:  Orders Placed This Encounter  Procedures  . XR Foot Complete Left   No orders of the defined types were placed in this encounter.     Procedures: No procedures performed   Clinical Data: No additional findings.   Subjective: Chief Complaint  Patient presents with  . Left Shoulder - Pain, Follow-up    MRI review  . Left Foot - Pain    [name] comes in today for evaluation of her left foot pain as well as review of her recent left shoulder MRI.  For her left foot she had an injury back in 2018 which required a washout and closed treatment.  She did well from this and the fractures have gone on to heal.  She states that the pain in her left foot is localized over the small toe and  feels like it is popping in and out.  She is currently in pain management.  For left shoulder she has had a recent MRI that she is here to review.  She states that she has had chronic pain on a daily basis.   Review of Systems  Constitutional: Negative.   HENT: Negative.   Eyes: Negative.   Respiratory: Negative.   Cardiovascular: Negative.   Endocrine: Negative.   Musculoskeletal: Negative.   Neurological: Negative.   Hematological: Negative.   Psychiatric/Behavioral: Negative.   All other systems reviewed and are negative.    Objective: Vital Signs: LMP 04/10/2006   Physical Exam Vitals signs and nursing note reviewed.  Constitutional:      Appearance: She is well-developed.  Pulmonary:     Effort: Pulmonary effort is normal.  Skin:    General: Skin is warm.     Capillary Refill: Capillary refill takes less than 2 seconds.  Neurological:     Mental Status: She is alert and oriented to person, place, and time.  Psychiatric:        Behavior: Behavior normal.        Thought Content: Thought content normal.        Judgment: Judgment normal.  Ortho Exam Left foot exam shows no clinical malalignments.  She has no tenderness of the metatarsal heads.  She has decent range of motion of the MTP joints.  The small toe has a slight varus alignment.  Skin is intact.  Left shoulder exam is unchanged. Specialty Comments:  No specialty comments available.  Imaging: Xr Foot Complete Left  Result Date: 12/30/2018 Healed metatarsal head and neck fractures.  Healed small toe fracture    PMFS History: Patient Active Problem List   Diagnosis Date Noted  . Nontraumatic tear of left supraspinatus tendon 12/30/2018  . Gout 12/17/2018  . Nontraumatic complete tear of right rotator cuff 12/08/2018  . Rotator cuff tear, right 12/01/2018  . Chest pain 09/24/2018  . Right groin pain 07/20/2018  . Depressed mood 07/13/2018  . Mouth pain 06/02/2018  . Paroxysmal SVT  (supraventricular tachycardia) ([place]) 03/20/2018  . Pure hypercholesterolemia 03/20/2018  . Muscle cramping 02/25/2018  . Nodule of upper lobe of right lung 02/13/2018  . Falls, subsequent encounter 01/30/2018  . Pain in left foot 11/04/2017  . Open wound of left foot 11/04/2017  . Essential hypertension 10/27/2017  . Solitary pulmonary nodule 10/07/2017  . Restrictive lung disease secondary to obesity 10/01/2017  . Polycythemia, secondary 10/01/2017  . Chronic viral hepatitis B without delta-agent ([place]) 07/08/2017  . COPD (chronic obstructive pulmonary disease) with chronic bronchitis ([place]) 06/27/2017  . Chronic kidney disease (CKD), stage IV (severe) ([place]) 01/14/2017  . Type 2 diabetes mellitus with stage 3 chronic kidney disease, with long-term current use of insulin ([place]) 12/12/2016  . Urge incontinence of urine 12/05/2016  . Trigger index finger of left hand 07/15/2016  . Back pain 04/07/2014  . Morbid obesity ([place]) 10/02/2012  . Chronic diastolic CHF (congestive heart failure) ([place]) 04/07/2012  . GERD 01/26/2010  . Obstructive sleep apnea treated with BiPAP 02/03/2008  . FIBROCYSTIC BREAST DISEASE 10/28/2006  . Hyperlipidemia 03/27/2006  . Obesity hypoventilation syndrome ([place]) 03/27/2006  . Former tobacco use 03/27/2006  . Depression with anxiety 03/27/2006   Past Medical History:  Diagnosis Date  . Anxiety   . Arthritis 04-10-11   hips, shoulders, back  . Asthma   . Bipolar disorder ([place])   . Cancer ([place]) 1993   cervical, no treatment done, went away per pt  . Cardiomegaly   . Cervical dysplasia or atypia 04-10-11   '93- once dx.-got pregnant-no intervention, then postpartum, no dysplasia found  . CHF (congestive heart failure) ([place])    no cardiologist 2014 dx, none now  . Chronic hypoxemic respiratory failure ([place])   . Chronic kidney disease    stage 3 kidney disease  . 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 Pathology showed Condyloma,  gluteal CONDYLOMA ACUMINATUM.   [name] Kitchen COPD (chronic obstructive pulmonary disease) ([place])   . Diabetes mellitus without complication ([place])   . Dyspnea    with actity, sitting  . Fall 12/16/2016  . Fibrocystic breast disease   . GERD (gastroesophageal reflux disease)   . Grade I diastolic dysfunction 16/10/9602   Noted on ECHO  . Hepatitis    hep B-count is low at present,doesn't register  . History of PSVT (paroxysmal supraventricular tachycardia)   . Hypercalcemia   . Hyperlipidemia   . Hypertension   . Incomplete left bundle branch block (LBBB) 09/24/2018   Noted on EKG  . Lung nodule 11/2017   right and left lung  . Migraine headache    none recent  . Morbid  obesity ([place]) 03/27/2006  . Neuropathy   . Pneumonia    walking pneu 15 yrs. ago  . Skin lesion 03/15/2011   In gluteal crease now s/p removal by Dr. [name] of General Surgery on 3/12. Path shows condyloma.     . Sleep apnea 04-10-11   uses cpap, pt does not know settings  . Sleep apnea   . Trigger finger    left third  . Urge incontinence of urine     Family History  Problem Relation Age of Onset  . Pulmonary embolism Mother   . Stroke Father   . Alcohol abuse Father   . Pneumonia Father   . Cancer Maternal Aunt        breast  . Breast cancer Maternal Aunt   . Asthma Other   . COPD Other   . Hypertension Other   . Diabetes Other   . Asthma Sister   . Depression Sister   . Arthritis Sister   . Sickle cell anemia Son     Past Surgical History:  Procedure Laterality Date  . ANAL FISTULECTOMY  04/17/2011   Procedure: FISTULECTOMY ANAL;  Surgeon: Imogene Burn. [name], MD;  Location: [facility];  Service: General;  Laterality: N/A;  Excision of Condyloma Gluteal Cleft   . BACK SURGERY  04-10-11   x5-Lumbar fusion-retained hardware.(Dr. Louanne Skye)  . BOTOX INJECTION N/A 12/29/2018   Procedure: BOTOX INJECTION(100 UNITS)  WITH CYSTOSCOPY;  Surgeon: Ceasar Mons, MD;  Location: [facility];  Service: Urology;  Laterality:  N/A;  . BREAST CYST EXCISION     bilateral breast, 3 cysts removed from each breast  . BREAST EXCISIONAL BIOPSY Right    x 3  . BREAST EXCISIONAL BIOPSY Left    x 3  . CARPAL TUNNEL RELEASE Bilateral   . Cervical biospy    . COLONOSCOPY WITH PROPOFOL N/A 07/06/2015   Procedure: COLONOSCOPY WITH PROPOFOL;  Surgeon: [name], MD;  Location: [facility];  Service: Endoscopy;  Laterality: N/A;  . DOPPLER ECHOCARDIOGRAPHY  04/06/2012   AT [place] 55-60%  . FRACTURE SURGERY     little toe left foot  . I&D EXTREMITY Left 12/18/2016   Procedure: IRRIGATION AND DEBRIDEMENT LEFT FOOT, CLOSURE;  Surgeon: [name], MD;  Location: [facility];  Service: Orthopedics;  Laterality: Left;  [name] Kitchen MULTIPLE TOOTH EXTRACTIONS    . NECK SURGERY  04-10-11   x3- cervical fusion with plating and screws-Dr. [name]  . TEE WITHOUT CARDIOVERSION N/A 04/06/2012   Procedure: TRANSESOPHAGEAL ECHOCARDIOGRAM (TEE);  Surgeon: [name], MD;  Location: [facility]OS[facility];  Service: Cardiovascular;  Laterality: N/A;  . TRIGGER FINGER RELEASE Right    middle finger  . TRIGGER FINGER RELEASE Left 06/28/2016   Procedure: RELEASE TRIGGER FINGER LEFT 3RD FINGER;  Surgeon: [name], MD;  Location: [facility];  Service: Orthopedics;  Laterality: Left;  . TRIGGER FINGER RELEASE Right 06/12/2016   Procedure: RIGHT INDEX FINGER TRIGGER RELEASE;  Surgeon: [name], MD;  Location: [facility];  Service: Orthopedics;  Laterality: Right;  . TRIGGER FINGER RELEASE Right 01/19/2018   Procedure: RIGHT RING FINGER TRIGGER FINGER RELEASE;  Surgeon: [name], MD;  Location: [facility];  Service: Orthopedics;  Laterality: Right;   Social History   Occupational History    Comment: Disabled  Tobacco Use  . Smoking status: Current Every Day Smoker    Packs/day: 0.20    Years: 40.00    Pack years: 8.00  Types: Cigarettes    Start date: 01/28/1977    Last attempt to quit: 10/19/2018    Years since quitting: 0.1  . Smokeless tobacco:  Never Used  . Tobacco comment: Quit 8 months 2019 - restart 05/2018   Hx 1.5 PPD  Substance and Sexual Activity  . Alcohol use: No    Alcohol/week: 0.0 standard drinks  . Drug use: Not Currently    Types: Marijuana, "Crack" cocaine    Comment: hx marijuana usemany years ago, Crack -none since 11/2015  . Sexual activity: Not Currently    Partners: Male    Birth control/protection: Post-menopausal

## 2018-12-31 ENCOUNTER — Telehealth: Payer: Self-pay | Admitting: Pharmacist

## 2018-12-31 MED ORDER — BD PEN NEEDLE MINI U/F 31G X 5 MM MISC: 12 refills | Status: DC

## 2018-12-31 NOTE — Telephone Encounter (Signed)
Patient called requesting new/refill of prescription for her "Ozempic" pen needles.   Pen needles sent to [facility]

## 2018-12-31 NOTE — Telephone Encounter (Signed)
Patient was concerned she had the wrong pens.  She thought the pen needles were different for the Ozempic and her Lantus.   When I contacted her she stated she received BD pen needles.  I shared with her that these pen needles should work for both of her injections.   She verbalized understanding.

## 2019-01-04 DIAGNOSIS — M5090 Cervical disc disorder, unspecified, unspecified cervical region: Secondary | ICD-10-CM | POA: Diagnosis not present

## 2019-01-04 DIAGNOSIS — J9621 Acute and chronic respiratory failure with hypoxia: Secondary | ICD-10-CM | POA: Diagnosis not present

## 2019-01-04 DIAGNOSIS — G8929 Other chronic pain: Secondary | ICD-10-CM | POA: Diagnosis not present

## 2019-01-04 DIAGNOSIS — G4733 Obstructive sleep apnea (adult) (pediatric): Secondary | ICD-10-CM | POA: Diagnosis not present

## 2019-01-04 DIAGNOSIS — M48061 Spinal stenosis, lumbar region without neurogenic claudication: Secondary | ICD-10-CM | POA: Diagnosis not present

## 2019-01-05 ENCOUNTER — Telehealth: Payer: Self-pay | Admitting: Pulmonary Disease

## 2019-01-05 NOTE — Telephone Encounter (Signed)
Sched for 1/11 @ 12.  Pt aware of appt date/time.

## 2019-01-05 NOTE — Telephone Encounter (Signed)
Working on this one.  

## 2019-01-06 ENCOUNTER — Encounter: Payer: Self-pay | Admitting: Cardiology

## 2019-01-06 ENCOUNTER — Telehealth: Payer: Medicare Other | Admitting: Cardiology

## 2019-01-06 ENCOUNTER — Telehealth (INDEPENDENT_AMBULATORY_CARE_PROVIDER_SITE_OTHER): Payer: Medicare Other | Admitting: Cardiology

## 2019-01-06 ENCOUNTER — Telehealth: Payer: Self-pay

## 2019-01-06 VITALS — BP 142/76 | Ht 70.0 in | Wt 365.0 lb

## 2019-01-06 DIAGNOSIS — I5032 Chronic diastolic (congestive) heart failure: Secondary | ICD-10-CM

## 2019-01-06 DIAGNOSIS — Z01818 Encounter for other preprocedural examination: Secondary | ICD-10-CM | POA: Diagnosis not present

## 2019-01-06 DIAGNOSIS — I471 Supraventricular tachycardia: Secondary | ICD-10-CM

## 2019-01-06 NOTE — Patient Instructions (Addendum)
Medication Instructions:  Your Physician recommend you continue on your current medication as directed.    *If you need a refill on your cardiac medications before your next appointment, please call your pharmacy*  Lab Work: None  Testing/Procedures: Your physician has requested that you have a lexiscan myoview. For further information please visit [email or web address]. Please follow instruction sheet, as given. [address]   Follow-Up: At [facility], you and your health needs are our priority.  As part of our continuing mission to provide you with exceptional heart care, we have created designated Provider Care Teams.  These Care Teams include your primary Cardiologist (physician) and Advanced Practice Providers (APPs -  Physician Assistants and Nurse Practitioners) who all work together to provide you with the care you need, when you need it.  Your next appointment:   3 month(s)  The format for your next appointment:   In Person  Provider:   [name], MD   You are scheduled for a Myocardial Perfusion Imaging Study on.  Please arrive 15 minutes prior to your appointment time for registration and insurance purposes.  The test will take approximately 3 to 4 hours to complete; you may bring reading material.  If someone comes with you to your appointment, they will need to remain in the main lobby due to limited space in the testing area. **If you are pregnant or breastfeeding, please notify the nuclear lab prior to your appointment**  How to prepare for your Myocardial Perfusion Test: . Do not eat or drink 3 hours prior to your test, except you may have water. . Do not consume products containing caffeine (regular or decaffeinated) 12 hours prior to your test. (ex: coffee, chocolate, sodas, tea). . Do bring a list of your current medications with you.  If not listed below, you may take your medications as normal. . Do wear comfortable clothes (no  dresses or overalls) and walking shoes, tennis shoes preferred (No heels or open toe shoes are allowed). . Do NOT wear cologne, perfume, aftershave, or lotions (deodorant is allowed). . If these instructions are not followed, your test will have to be rescheduled.  Please report to [address] for your test.  If you have questions or concerns about your appointment, you can call the Nuclear Lab at [phone].  If you cannot keep your appointment, please provide 24 hours notification to the Nuclear Lab, to avoid a possible $50 charge to your account.

## 2019-01-06 NOTE — Telephone Encounter (Signed)
Called pt to go over AVS and Lexiscan instructions. Pt verbalized understanding.

## 2019-01-06 NOTE — Progress Notes (Signed)
Virtual Visit via Video Note   This visit type was conducted due to national recommendations for restrictions regarding the COVID-19 Pandemic (e.g. social distancing) in an effort to limit this patient's exposure and mitigate transmission in our community.  Due to her co-morbid illnesses, this patient is at least at moderate risk for complications without adequate follow up.  This format is felt to be most appropriate for this patient at this time.  All issues noted in this document were discussed and addressed.  A limited physical exam was performed with this format.  Please refer to the patient's chart for her consent to telehealth for [facility].   Date:  16/06/628   ID:  [name], DOB [birth year], MRN [id number]  Patient Location: Home Provider Location: Home  PCP:  [name], MD  Cardiologist:  [name], MD  Electrophysiologist:  None   Evaluation Performed:  Follow-Up Visit  Chief Complaint: follow up  History of Present Illness:    TYIANNA MENEFEE is a 53 y.o. female with chronic diastolic heart failure, chronic hypoxic respiratory failure, COPD, OSA on PAP, morbid obesity, paroxysmal SVT, type II diabetes on insulin, CKD stage 3, hypertension, hyperlipidemia   The patient does not have symptoms concerning for COVID-19 infection (fever, chills, cough, or new shortness of breath).   Cardiac history: Complex, see my initial notes. Has struggled with diastolic heart failure, has chest pai when she gets fluid overloaded. Was on oxygen, having "breathing crises" that seemed to be related to paroxysmal SVT. She was well controlled for many months, then had an episode requiring hospitalization 08/2018 for chest pain and elevated weight. HsTn unremarkable (6 -> 3), no SVT on monitor. BNP 41.7. Covid negative. Was diuresed, lost fluid, swelling on left side went down while she was laying down but came back when she got back up. She attributes the episode to a  myelogram, gained 16 lbs and started having symptoms.   Today: Needs preoperative cardiovascular evaluation today. Once her A1c is below 8, which she anticipates on her recheck, she is planned for right shoulder surgery, likely in 01/2019. After the right shoulder heals, then she will plan to get the left shoulder done.  We reviewed preop questions today. She can walk around the house, but she has not done any activity greater than 4 METs. We discussed options for evaluation. Her last stress test was >1 year ago. She is amenable to repeat 2 day lexiscan stress test as part of her preoperative evaluation.  Weight is 365 lbs, congratulated on this. This is more than 50 lbs lost since I met her. Her breathing is good. Using O2 at night. Waiting on new mask for cpap.   Denies chest pain, PND, orthopnea, LE edema or unexpected weight gain. No syncope or recent palpitations.  Past Medical History:  Diagnosis Date   Anxiety    Arthritis 04-10-11   hips, shoulders, back   Asthma    Bipolar disorder ([place])    Cancer ([place]) 1993   cervical, no treatment done, went away per pt   Cardiomegaly    Cervical dysplasia or atypia 04-10-11   '93- once dx.-got pregnant-no intervention, then postpartum, no dysplasia found   CHF (congestive heart failure) ([place])    no cardiologist 2014 dx, none now   Chronic hypoxemic respiratory failure ([place])    Chronic kidney disease    stage 3 kidney disease   Condyloma - gluteal cleft 04/09/2011   Removed by general surgery.  Pathology showed Condyloma, gluteal CONDYLOMA ACUMINATUM.    COPD (chronic obstructive pulmonary disease) (H[facility])    Diabetes mellitus without complication ([place])    Dyspnea    with actity, sitting   Fall 12/16/2016   Fibrocystic breast disease    GERD (gastroesophageal reflux disease)    Grade I diastolic dysfunction 09/47/0962   Noted on ECHO   Hepatitis    hep B-count is low at present,doesn't register   History of PSVT  (paroxysmal supraventricular tachycardia)    Hypercalcemia    Hyperlipidemia    Hypertension    Incomplete left bundle branch block (LBBB) 09/24/2018   Noted on EKG   Lung nodule 11/2017   right and left lung   Migraine headache    none recent   Morbid obesity ([place]) 03/27/2006   Neuropathy    Pneumonia    walking pneu 15 yrs. ago   Skin lesion 03/15/2011   In gluteal crease now s/p removal by Dr. [name] of General Surgery on 3/12. Path shows condyloma.      Sleep apnea 04-10-11   uses cpap, pt does not know settings   Sleep apnea    Trigger finger    left third   Urge incontinence of urine    Past Surgical History:  Procedure Laterality Date   ANAL FISTULECTOMY  04/17/2011   Procedure: FISTULECTOMY ANAL;  Surgeon: Imogene Burn. [name], MD;  Location: [facility];  Service: General;  Laterality: N/A;  Excision of Condyloma Gluteal Cleft    BACK SURGERY  04-10-11   x5-Lumbar fusion-retained hardware.(Dr. Louanne Skye)   BOTOX INJECTION N/A 12/29/2018   Procedure: BOTOX INJECTION(100 UNITS)  WITH CYSTOSCOPY;  Surgeon: Ceasar Mons, MD;  Location: [facility];  Service: Urology;  Laterality: N/A;   BREAST CYST EXCISION     bilateral breast, 3 cysts removed from each breast   BREAST EXCISIONAL BIOPSY Right    x 3   BREAST EXCISIONAL BIOPSY Left    x 3   CARPAL TUNNEL RELEASE Bilateral    Cervical biospy     COLONOSCOPY WITH PROPOFOL N/A 07/06/2015   Procedure: COLONOSCOPY WITH PROPOFOL;  Surgeon: [name], MD;  Location: [facility];  Service: Endoscopy;  Laterality: N/A;   DOPPLER ECHOCARDIOGRAPHY  04/06/2012   AT [place] 55-60%   FRACTURE SURGERY     little toe left foot   I&D EXTREMITY Left 12/18/2016   Procedure: IRRIGATION AND DEBRIDEMENT LEFT FOOT, CLOSURE;  Surgeon: [name], MD;  Location: [place];  Service: Orthopedics;  Laterality: Left;   MULTIPLE TOOTH EXTRACTIONS     NECK SURGERY  04-10-11   x3- cervical fusion with plating and  screws-Dr. [name]   TEE WITHOUT CARDIOVERSION N/A 04/06/2012   Procedure: TRANSESOPHAGEAL ECHOCARDIOGRAM (TEE);  Surgeon: [name], MD;  Location: [facility];  Service: Cardiovascular;  Laterality: N/A;   TRIGGER FINGER RELEASE Right    middle finger   TRIGGER FINGER RELEASE Left 06/28/2016   Procedure: RELEASE TRIGGER FINGER LEFT 3RD FINGER;  Surgeon: [name], MD;  Location: [place];  Service: Orthopedics;  Laterality: Left;   TRIGGER FINGER RELEASE Right 06/12/2016   Procedure: RIGHT INDEX FINGER TRIGGER RELEASE;  Surgeon: [name], MD;  Location: [place];  Service: Orthopedics;  Laterality: Right;   TRIGGER FINGER RELEASE Right 01/19/2018   Procedure: RIGHT RING FINGER TRIGGER FINGER RELEASE;  Surgeon: [name], MD;  Location: [place];  Service: Orthopedics;  Laterality: Right;     Current  Meds  Medication Sig   ACCU-CHEK SOFTCLIX LANCETS lancets TEST 4 TIMES DAILY   acetaminophen (TYLENOL) 500 MG tablet Take 1,000 mg by mouth 2 (two) times a day.    ammonium lactate (LAC-HYDRIN) 12 % lotion APPLY 1 APPLICATION TOPICALLY 2 (TWO) TIMES DAILY AS NEEDED FOR DRY SKIN.   atorvastatin (LIPITOR) 40 MG tablet TAKE 1 TABLET BY MOUTH EVERY DAY   b complex vitamins capsule Take 1 capsule by mouth daily.   baclofen (LIORESAL) 10 MG tablet Take 1 tablet (10 mg total) by mouth 3 (three) times daily as needed for muscle spasms. (Patient taking differently: Take 10 mg by mouth 3 (three) times daily. )   blood glucose meter kit and supplies KIT Dispense based on patient and insurance preference. Use up to four times daily as directed. (FOR ICD-9 250.00, 250.01).   busPIRone (BUSPAR) 10 MG tablet TAKE 1 TABLET BY MOUTH THREE TIMES A DAY (Patient taking differently: Take 10 mg by mouth 3 (three) times daily. )   Calcium Citrate-Vitamin D (CALCIUM CITRATE + D3 PO) Take 1 tablet by mouth daily.   Capsaicin-Menthol (PAIN RELIEF EX) Apply 1 application topically 4 (four) times daily  as needed (shoulder pain).    CHANTIX 0.5 MG tablet TAKE 1 TABLET BY MOUTH TWICE A DAY (Patient taking differently: Take 0.5 mg by mouth 2 (two) times daily. )   diclofenac Sodium (VOLTAREN) 1 % GEL Apply 2 g topically 4 (four) times daily.   diltiazem (CARDIZEM CD) 120 MG 24 hr capsule Take 1 capsule (120 mg total) by mouth 2 (two) times daily.   docusate sodium (COLACE) 100 MG capsule Take 200 mg by mouth 2 (two) times daily.   empagliflozin (JARDIANCE) 25 MG TABS tablet Take 25 mg by mouth daily.   febuxostat (ULORIC) 40 MG tablet Take 40 mg by mouth at bedtime.    Flaxseed, Linseed, (FLAXSEED OIL) 1000 MG CAPS Take 1,000 mg by mouth daily.   fluticasone (FLONASE) 50 MCG/ACT nasal spray Place 2 sprays into both nostrils 2 (two) times daily.   Fluticasone-Umeclidin-Vilant (TRELEGY ELLIPTA) 100-62.5-25 MCG/INH AEPB Inhale 1 puff into the lungs daily.   folic acid (FOLVITE) 818 MCG tablet Take 400 mcg by mouth daily.   furosemide (LASIX) 40 MG tablet Take 2 tablets (80 mg total) by mouth 2 (two) times daily.   gabapentin (NEURONTIN) 300 MG capsule TAKE 2 CAPSULES (600 MG TOTAL) BY MOUTH 3 (THREE) TIMES DAILY.   glucose blood (ACCU-CHEK AVIVA PLUS) test strip TEST 4 TIMES DAILY (Patient taking differently: 1 each by Other route 3 (three) times daily. TEST 3 TIMES DAILY)   HYDROcodone-acetaminophen (NORCO) 7.5-325 MG tablet Take 1 tablet by mouth every 6 (six) hours as needed for moderate pain.   hydrOXYzine (ATARAX/VISTARIL) 10 MG tablet TAKE 1 TABLET BY MOUTH EVERY DAY AS NEEDED (Patient taking differently: Take 10 mg by mouth daily. )   Insulin Glargine (LANTUS SOLOSTAR) 100 UNIT/ML Solostar Pen Inject 22 Units into the skin daily before breakfast. (Patient taking differently: Inject 20 Units into the skin daily before breakfast. )   insulin lispro (HUMALOG KWIKPEN) 100 UNIT/ML KwikPen Inject 0.08 mLs (8 Units total) into the skin 3 (three) times daily before meals.   Insulin Pen  Needle (B-D UF III MINI PEN NEEDLES) 31G X 5 MM MISC CHECK SUGARS 4 TIMES A DAY BEFORE MEALS AND AT BEDTIME.   ketoconazole (NIZORAL) 2 % cream APPLY 1 FINGERTIP AMOUNT TO EACH FOOT DAILY. (Patient taking differently: Apply 1  application topically 2 (two) times daily. )   loratadine (CLARITIN) 10 MG tablet Take 1 tablet (10 mg total) by mouth daily.   losartan (COZAAR) 100 MG tablet Take 1 tablet (100 mg total) by mouth daily.   metolazone (ZAROXOLYN) 2.5 MG tablet Take 1 tablet (2.5 mg total) by mouth 2 (two) times a week. TAKE 1 TABLET TWICE WEEKLY ON MONDAYS AND THURSDAYS ONLY   montelukast (SINGULAIR) 10 MG tablet TAKE 1 TABLET BY MOUTH EVERYDAY AT BEDTIME (Patient taking differently: Take 10 mg by mouth at bedtime. )   Multiple Vitamins-Minerals (MULTIVITAMIN WITH MINERALS) tablet Take 1 tablet by mouth daily.   mupirocin ointment (BACTROBAN) 2 % APPLY TO AFFECTED AREA TWICE A DAY (Patient taking differently: Apply 1 application topically 2 (two) times daily as needed (irritation). )   NARCAN 4 MG/0.1ML LIQD nasal spray kit Place 1 spray into the nose once.    nicotine polacrilex ([employer] NICOTINE POLACRILEX) 4 MG gum Take 1 each (4 mg total) by mouth as needed for smoking cessation.   NON FORMULARY Uses a C-PAP at bedtime   nystatin (MYCOSTATIN) 100000 UNIT/ML suspension Take 5 mLs (500,000 Units total) by mouth 4 (four) times daily. Swish and swallow. (Patient taking differently: Take 5 mLs by mouth daily as needed (thrush). Swish and swallow.)   Omega-3 Fatty Acids (FISH OIL) 1000 MG CAPS Take 1,000 mg by mouth daily.    omeprazole (PRILOSEC) 20 MG capsule Take 1 capsule (20 mg total) by mouth daily as needed. (Patient taking differently: Take 20 mg by mouth daily. )   phenazopyridine (PYRIDIUM) 200 MG tablet Take 1 tablet (200 mg total) by mouth 3 (three) times daily as needed (for pain with urination).   potassium chloride SA (KLOR-CON M20) 20 MEQ tablet Take 1 tablet (20 mEq  total) by mouth 2 (two) times a week. Take 1 tablet twice weekly on Monday and Thursday.   PROAIR HFA 108 (90 Base) MCG/ACT inhaler INHALE 2 PUFFS INTO THE LUNGS EVERY 6 (SIX) HOURS AS NEEDED FOR SHORTNESS OF BREATH. (Patient taking differently: Inhale 2 puffs into the lungs every 6 (six) hours as needed for wheezing or shortness of breath. )   promethazine (PHENERGAN) 25 MG tablet Take 1 tablet (25 mg total) by mouth every 6 (six) hours as needed for nausea.   Semaglutide,0.25 or 0.5MG/DOS, (OZEMPIC, 0.25 OR 0.5 MG/DOSE,) 2 MG/1.5ML SOPN Inject 0.25 mg into the skin once a week. Inject 0.25 mg into the skin once weekly on Thursdays. (minus 2 clicks)   sertraline (ZOLOFT) 25 MG tablet Take 1 tablet (25 mg total) by mouth daily.   sertraline (ZOLOFT) 50 MG tablet TAKE 1 TABLET BY MOUTH EVERY DAY (Patient taking differently: Take 50 mg by mouth daily. )   TOVIAZ 8 MG TB24 tablet Take 8 mg by mouth at bedtime.      Allergies:   Aspirin, Chantix [varenicline tartrate], Methadone hcl, Corticosteroids, and Levofloxacin   Social History   Tobacco Use   Smoking status: Current Every Day Smoker    Packs/day: 0.20    Years: 40.00    Pack years: 8.00    Types: Cigarettes    Start date: 01/28/1977    Last attempt to quit: 10/19/2018    Years since quitting: 0.2   Smokeless tobacco: Never Used   Tobacco comment: Quit 8 months 2019 - restart 05/2018   Hx 1.5 PPD  Substance Use Topics   Alcohol use: No    Alcohol/week: 0.0 standard drinks  Drug use: Not Currently    Types: Marijuana, "Crack" cocaine    Comment: hx marijuana usemany years ago, Crack -none since 11/2015     Family Hx: The patient's family history includes Alcohol abuse in her father; Arthritis in her sister; Asthma in her sister and another family member; Breast cancer in her maternal aunt; COPD in an other family member; Cancer in her maternal aunt; Depression in her sister; Diabetes in an other family member; Hypertension  in an other family member; Pneumonia in her father; Pulmonary embolism in her mother; Sickle cell anemia in her son; Stroke in her father.  ROS:   Please see the history of present illness.    Constitutional: Negative for chills, fever, night sweats, unintentional weight loss  HENT: Negative for ear pain and hearing loss.   Eyes: Negative for loss of vision and eye pain.  Respiratory: Negative for cough, sputum, wheezing.   Cardiovascular: See HPI. Gastrointestinal: Negative for abdominal pain, melena, and hematochezia.  Genitourinary: Negative for dysuria and hematuria.  Musculoskeletal: Negative for falls and myalgias.  Skin: Negative for itching and rash.  Neurological: Negative for focal weakness, focal sensory changes and loss of consciousness.  Endo/Heme/Allergies: Does not bruise/bleed easily.   Prior CV studies:   The following studies were reviewed today: Monitor, stress test, echo 2019  Labs/Other Tests and Data Reviewed:    EKG:  An ECG dated 09/24/18 was personally reviewed today and demonstrated:  SR, PAC, IVCD  Recent Labs: 03/06/2018: TSH 0.469 09/24/2018: B Natriuretic Peptide 41.7 12/21/2018: ALT 34; BUN 30; Creatinine, Ser 1.38; Hemoglobin 16.7; Platelets 323; Potassium 3.2; Sodium 140   Recent Lipid Panel Lab Results  Component Value Date/Time   CHOL 146 09/24/2018 11:36 PM   CHOL 145 10/27/2017 09:44 AM   TRIG 165 (H) 09/24/2018 11:36 PM   HDL 37 (L) 09/24/2018 11:36 PM   HDL 55 10/27/2017 09:44 AM   CHOLHDL 3.9 09/24/2018 11:36 PM   LDLCALC 76 09/24/2018 11:36 PM   LDLCALC 70 10/27/2017 09:44 AM   LDLDIRECT 114 (H) 09/15/2012 03:59 PM    Wt Readings from Last 3 Encounters:  01/06/19 (!) 365 lb (165.6 kg)  12/29/18 (!) 373 lb 4 oz (169.3 kg)  12/21/18 (!) 373 lb 4 oz (169.3 kg)      Objective:    Vital Signs:  BP (!) 142/76    Ht 5' 10" (1.778 m)    Wt (!) 365 lb (165.6 kg)    LMP 04/10/2006    BMI 52.37 kg/m    VITAL SIGNS:  reviewed GEN:  no  acute distress EYES:  sclerae anicteric, EOMI - Extraocular Movements Intact RESPIRATORY:  normal respiratory effort, symmetric expansion CARDIOVASCULAR:  no visible JVD SKIN:  no rash, lesions or ulcers. MUSCULOSKELETAL:  no obvious deformities. NEURO:  alert and oriented x 3, no obvious focal deficit PSYCH:  normal affect  ASSESSMENT & PLAN:    Preoperative cardiovascular exam: has had stable symptoms recently. However, cannot exercise greater than 4 METs. Given that this is an elective surgery, will perform 2 day lexiscan nuclear stress test to exclude significant ischemia. Given her body habitus, she may have some artifact/shadowing, but can compare to prior in 10/2017 to exclude significant changes. -if stress test without high risk findings, she is optimized from cardiovascular standpoint and acceptable risk for surgery. Recommend close respiratory monitoring given her OSA and chronic obesity hypoventilation syndrome.  Chronic diastolic heart failure:stable NYHA class III symptoms today -back on baseline regimen of  furosemide 80 mg BID and metolazone 2.5 mg twice weekly (with potassium) -doing an excellent job with weight loss. Very motivated. -very strict on diet, daily weights, monitoring for symptoms.   Paroxysmal SVT: no recent symptoms -continue diltiazem  Type II diabetes with stage 3 CKD: on insulin and jardiance -working on diet and weight loss -ultimately planning for bariatric surgery  Hypertension: mildly elevated today but has been well controlled. -she will continue to monitor. Goal <130/80. Will call if becomes consistently elevated -continue dilitazem, diuretics, losartan  Hypercholesterolemia: continue atorvastatin, lipids rechecked in hospital 08/2018  COVID-19 Education: The signs and symptoms of COVID-19 were discussed with the patient and how to seek care for testing (follow up with PCP or arrange E-visit).  The importance of social distancing was  discussed today.  Time:   Today, I have spent 16 minutes with the patient with telehealth technology discussing the above problems.     Medication Adjustments/Labs and Tests Ordered: Current medicines are reviewed at length with the patient today.  Concerns regarding medicines are outlined above.    Patient Instructions  Medication Instructions:  Your Physician recommend you continue on your current medication as directed.    *If you need a refill on your cardiac medications before your next appointment, please call your pharmacy*  Lab Work: None  Testing/Procedures: Your physician has requested that you have a lexiscan myoview. For further information please visit [email or web address]. Please follow instruction sheet, as given. [place] [address]   Follow-Up: At [facility], you and your health needs are our priority.  As part of our continuing mission to provide you with exceptional heart care, we have created designated Provider Care Teams.  These Care Teams include your primary Cardiologist (physician) and Advanced Practice Providers (APPs -  Physician Assistants and Nurse Practitioners) who all work together to provide you with the care you need, when you need it.  Your next appointment:   3 month(s)  The format for your next appointment:   In Person  Provider:   [name], MD   You are scheduled for a Myocardial Perfusion Imaging Study on.  Please arrive 15 minutes prior to your appointment time for registration and insurance purposes.  The test will take approximately 3 to 4 hours to complete; you may bring reading material.  If someone comes with you to your appointment, they will need to remain in the main lobby due to limited space in the testing area. **If you are pregnant or breastfeeding, please notify the nuclear lab prior to your appointment**  How to prepare for your Myocardial Perfusion Test:  Do not eat or drink 3 hours prior to  your test, except you may have water.  Do not consume products containing caffeine (regular or decaffeinated) 12 hours prior to your test. (ex: coffee, chocolate, sodas, tea).  Do bring a list of your current medications with you.  If not listed below, you may take your medications as normal.  Do wear comfortable clothes (no dresses or overalls) and walking shoes, tennis shoes preferred (No heels or open toe shoes are allowed).  Do NOT wear cologne, perfume, aftershave, or lotions (deodorant is allowed).  If these instructions are not followed, your test will have to be rescheduled.  Please report to [address], [address] for your test.  If you have questions or concerns about your appointment, you can call the Nuclear Lab at [phone].  If you cannot keep your appointment, please provide 24  hours notification to the Nuclear Lab, to avoid a possible $50 charge to your account.     Follow Up:  3 mos  Signed, [name], MD  01/06/2019 3:13 PM    [facility]

## 2019-01-06 NOTE — Telephone Encounter (Signed)
Spoke to pt and changed appointment to virtual.

## 2019-01-07 ENCOUNTER — Telehealth: Payer: Self-pay | Admitting: Pharmacist

## 2019-01-07 NOTE — Telephone Encounter (Signed)
Contacted patient RE tobacco cessation and diabetes.   Patient reported complete cessation from smoking for 7 days with use of low-dose Chantix and nicotine gum.  She states that she is "doing great".  Continued abstinence was encouraged.   Diabetes control per patient reports is excellent.  Reports blood sugar readings are all in the 100-200 range.   She also reports continued weight loss - weight today and yesterday is 365.  This is her lowest weight since 02/2017.   No changes today.  Phone follow-up planned in 7-10 days.

## 2019-01-10 ENCOUNTER — Other Ambulatory Visit: Payer: Self-pay | Admitting: Family Medicine

## 2019-01-12 ENCOUNTER — Telehealth (HOSPITAL_COMMUNITY): Payer: Self-pay

## 2019-01-12 ENCOUNTER — Encounter: Payer: Self-pay | Admitting: Family Medicine

## 2019-01-12 DIAGNOSIS — J449 Chronic obstructive pulmonary disease, unspecified: Secondary | ICD-10-CM

## 2019-01-12 NOTE — Telephone Encounter (Signed)
Encounter complete. 

## 2019-01-13 ENCOUNTER — Telehealth (HOSPITAL_COMMUNITY): Payer: Self-pay

## 2019-01-13 NOTE — Telephone Encounter (Signed)
Encounter complete. 

## 2019-01-14 ENCOUNTER — Encounter (HOSPITAL_COMMUNITY): Payer: Self-pay

## 2019-01-14 ENCOUNTER — Ambulatory Visit (HOSPITAL_COMMUNITY)
Admission: RE | Admit: 2019-01-14 | Payer: Medicare Other | Source: Ambulatory Visit | Attending: Cardiology | Admitting: Cardiology

## 2019-01-14 ENCOUNTER — Emergency Department (HOSPITAL_COMMUNITY)
Admission: EM | Admit: 2019-01-14 | Discharge: 2019-01-14 | Disposition: A | Discharge status: Home / Self Care | Payer: Medicare Other | Attending: Emergency Medicine | Admitting: Emergency Medicine

## 2019-01-14 ENCOUNTER — Emergency Department (HOSPITAL_COMMUNITY): Payer: Medicare Other

## 2019-01-14 ENCOUNTER — Other Ambulatory Visit: Payer: Self-pay

## 2019-01-14 ENCOUNTER — Encounter: Payer: Self-pay | Admitting: Specialist

## 2019-01-14 DIAGNOSIS — M25551 Pain in right hip: Secondary | ICD-10-CM

## 2019-01-14 DIAGNOSIS — I130 Hypertensive heart and chronic kidney disease with heart failure and stage 1 through stage 4 chronic kidney disease, or unspecified chronic kidney disease: Secondary | ICD-10-CM | POA: Insufficient documentation

## 2019-01-14 DIAGNOSIS — Z8541 Personal history of malignant neoplasm of cervix uteri: Secondary | ICD-10-CM | POA: Insufficient documentation

## 2019-01-14 DIAGNOSIS — I5032 Chronic diastolic (congestive) heart failure: Secondary | ICD-10-CM | POA: Diagnosis not present

## 2019-01-14 DIAGNOSIS — Z794 Long term (current) use of insulin: Secondary | ICD-10-CM | POA: Insufficient documentation

## 2019-01-14 DIAGNOSIS — E1122 Type 2 diabetes mellitus with diabetic chronic kidney disease: Secondary | ICD-10-CM | POA: Insufficient documentation

## 2019-01-14 DIAGNOSIS — F17210 Nicotine dependence, cigarettes, uncomplicated: Secondary | ICD-10-CM | POA: Diagnosis not present

## 2019-01-14 DIAGNOSIS — Z79899 Other long term (current) drug therapy: Secondary | ICD-10-CM | POA: Diagnosis not present

## 2019-01-14 DIAGNOSIS — J45909 Unspecified asthma, uncomplicated: Secondary | ICD-10-CM | POA: Diagnosis not present

## 2019-01-14 DIAGNOSIS — N184 Chronic kidney disease, stage 4 (severe): Secondary | ICD-10-CM | POA: Diagnosis not present

## 2019-01-14 DIAGNOSIS — M79604 Pain in right leg: Secondary | ICD-10-CM | POA: Diagnosis not present

## 2019-01-14 MED ORDER — HYDROMORPHONE HCL 1 MG/ML IJ SOLN
1.0000 mg | Freq: Once | INTRAMUSCULAR | Status: AC
  Administered 2019-01-14: 1 mg via INTRAMUSCULAR
  Filled 2019-01-14: qty 1

## 2019-01-14 MED ORDER — ALBUTEROL SULFATE HFA 108 (90 BASE) MCG/ACT IN AERS: INHALATION_SPRAY | RESPIRATORY_TRACT | 0 refills | Status: DC

## 2019-01-14 NOTE — ED Triage Notes (Signed)
Patient c/o right hip pain x 6 days. Patient states she has been using a TENS unit with no relief. Patient states pain worse when she ambulates. Patient denies any injury.

## 2019-01-14 NOTE — Discharge Instructions (Addendum)
X-rays show bad arthritis in your right hip.  You have been given an injection for pain today.  Follow-up with your primary care and/or orthopedic doctor.

## 2019-01-14 NOTE — ED Provider Notes (Signed)
[facility] DEPT Provider Note   CSN: [id number] Arrival date & time: 01/14/19  1043     History Chief Complaint  Patient presents with  . Hip Pain    [name] is a 53 y.o. female.  Right lateral hip pain for several days.  No trauma or injury.  Pain worse with ambulation.  Has been using a TENS unit without relief.  Taking hydrocodone 7.5/325 mg at home.  Unable to take prednisone or NSAIDs secondary to other health problems.  Past medical history includes morbid obesity, chronic kidney disease, COPD, bipolar disorder, many others.        Past Medical History:  Diagnosis Date  . Anxiety   . Arthritis 04-10-11   hips, shoulders, back  . Asthma   . Bipolar disorder ([place])   . Cancer ([place]) 1993   cervical, no treatment done, went away per pt  . Cardiomegaly   . Cervical dysplasia or atypia 04-10-11   '93- once dx.-got pregnant-no intervention, then postpartum, no dysplasia found  . CHF (congestive heart failure) ([place])    no cardiologist 2014 dx, none now  . Chronic hypoxemic respiratory failure ([place])   . Chronic kidney disease    stage 3 kidney disease  . 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 Pathology showed Condyloma, gluteal CONDYLOMA ACUMINATUM.   [name] Kitchen COPD (chronic obstructive pulmonary disease) ([place])   . Diabetes mellitus without complication ([place])   . Dyspnea    with actity, sitting  . Fall 12/16/2016  . Fibrocystic breast disease   . GERD (gastroesophageal reflux disease)   . Grade I diastolic dysfunction 94/49/6759   Noted on ECHO  . Hepatitis    hep B-count is low at present,doesn't register  . History of PSVT (paroxysmal supraventricular tachycardia)   . Hypercalcemia   . Hyperlipidemia   . Hypertension   . Incomplete left bundle branch block (LBBB) 09/24/2018   Noted on EKG  . Lung nodule 11/2017   right and left lung  . Migraine headache    none recent  . Morbid obesity ([place])  03/27/2006  . Neuropathy   . Pneumonia    walking pneu 15 yrs. ago  . Skin lesion 03/15/2011   In gluteal crease now s/p removal by Dr. [name] of General Surgery on 3/12. Path shows condyloma.     . Sleep apnea 04-10-11   uses cpap, pt does not know settings  . Sleep apnea   . Trigger finger    left third  . Urge incontinence of urine     Patient Active Problem List   Diagnosis Date Noted  . Nontraumatic tear of left supraspinatus tendon 12/30/2018  . Gout 12/17/2018  . Nontraumatic complete tear of right rotator cuff 12/08/2018  . Rotator cuff tear, right 12/01/2018  . Chest pain 09/24/2018  . Right groin pain 07/20/2018  . Depressed mood 07/13/2018  . Mouth pain 06/02/2018  . Paroxysmal SVT (supraventricular tachycardia) ([place]) 03/20/2018  . Pure hypercholesterolemia 03/20/2018  . Muscle cramping 02/25/2018  . Nodule of upper lobe of right lung 02/13/2018  . Falls, subsequent encounter 01/30/2018  . Pain in left foot 11/04/2017  . Open wound of left foot 11/04/2017  . Essential hypertension 10/27/2017  . Solitary pulmonary nodule 10/07/2017  . Restrictive lung disease secondary to obesity 10/01/2017  . Polycythemia, secondary 10/01/2017  . Chronic viral hepatitis B without delta-agent ([place]) 07/08/2017  . COPD (chronic obstructive pulmonary disease) with chronic bronchitis ([place])  06/27/2017  . Chronic kidney disease (CKD), stage IV (severe) ([place]) 01/14/2017  . Type 2 diabetes mellitus with stage 3 chronic kidney disease, with long-term current use of insulin ([place]) 12/12/2016  . Urge incontinence of urine 12/05/2016  . Trigger index finger of left hand 07/15/2016  . Back pain 04/07/2014  . Morbid obesity ([place]) 10/02/2012  . Chronic diastolic CHF (congestive heart failure) ([place]) 04/07/2012  . GERD 01/26/2010  . Obstructive sleep apnea treated with BiPAP 02/03/2008  . FIBROCYSTIC BREAST DISEASE 10/28/2006  . Hyperlipidemia 03/27/2006  . Obesity hypoventilation syndrome ([place])  03/27/2006  . Former tobacco use 03/27/2006  . Depression with anxiety 03/27/2006    Past Surgical History:  Procedure Laterality Date  . ANAL FISTULECTOMY  04/17/2011   Procedure: FISTULECTOMY ANAL;  Surgeon: Imogene Burn. [name], MD;  Location: [facility];  Service: General;  Laterality: N/A;  Excision of Condyloma Gluteal Cleft   . BACK SURGERY  04-10-11   x5-Lumbar fusion-retained hardware.(Dr. Louanne Skye)  . BOTOX INJECTION N/A 12/29/2018   Procedure: BOTOX INJECTION(100 UNITS)  WITH CYSTOSCOPY;  Surgeon: Ceasar Mons, MD;  Location: [facility];  Service: Urology;  Laterality: N/A;  . BREAST CYST EXCISION     bilateral breast, 3 cysts removed from each breast  . BREAST EXCISIONAL BIOPSY Right    x 3  . BREAST EXCISIONAL BIOPSY Left    x 3  . CARPAL TUNNEL RELEASE Bilateral   . Cervical biospy    . COLONOSCOPY WITH PROPOFOL N/A 07/06/2015   Procedure: COLONOSCOPY WITH PROPOFOL;  Surgeon: [name], MD;  Location: [facility];  Service: Endoscopy;  Laterality: N/A;  . DOPPLER ECHOCARDIOGRAPHY  04/06/2012   AT [facility] 55-60%  . FRACTURE SURGERY     little toe left foot  . I & D EXTREMITY Left 12/18/2016   Procedure: IRRIGATION AND DEBRIDEMENT LEFT FOOT, CLOSURE;  Surgeon: [name], MD;  Location: [facility];  Service: Orthopedics;  Laterality: Left;  [name] Kitchen MULTIPLE TOOTH EXTRACTIONS    . NECK SURGERY  04-10-11   x3- cervical fusion with plating and screws-Dr. [name]  . TEE WITHOUT CARDIOVERSION N/A 04/06/2012   Procedure: TRANSESOPHAGEAL ECHOCARDIOGRAM (TEE);  Surgeon: [name], MD;  Location: [facility] ENDOSCOPY;  Service: Cardiovascular;  Laterality: N/A;  . TRIGGER FINGER RELEASE Right    middle finger  . TRIGGER FINGER RELEASE Left 06/28/2016   Procedure: RELEASE TRIGGER FINGER LEFT 3RD FINGER;  Surgeon: [name], MD;  Location: [facility];  Service: Orthopedics;  Laterality: Left;  . TRIGGER FINGER RELEASE Right 06/12/2016   Procedure: RIGHT INDEX FINGER TRIGGER RELEASE;   Surgeon: [name], MD;  Location: [facility];  Service: Orthopedics;  Laterality: Right;  . TRIGGER FINGER RELEASE Right 01/19/2018   Procedure: RIGHT RING FINGER TRIGGER FINGER RELEASE;  Surgeon: [name], MD;  Location: [facility];  Service: Orthopedics;  Laterality: Right;     OB History   No obstetric history on file.     Family History  Problem Relation Age of Onset  . Pulmonary embolism Mother   . Stroke Father   . Alcohol abuse Father   . Pneumonia Father   . Cancer Maternal Aunt        breast  . Breast cancer Maternal Aunt   . Asthma Other   . COPD Other   . Hypertension Other   . Diabetes Other   . Asthma Sister   . Depression Sister   . Arthritis Sister   .  Sickle cell anemia Son     Social History   Tobacco Use  . Smoking status: Current Every Day Smoker    Packs/day: 0.20    Years: 40.00    Pack years: 8.00    Types: Cigarettes    Start date: 01/28/1977    Last attempt to quit: 10/19/2018    Years since quitting: 0.2  . Smokeless tobacco: Never Used  . Tobacco comment: Quit 8 months 2019 - restart 05/2018   Hx 1.5 PPD  Substance Use Topics  . Alcohol use: No    Alcohol/week: 0.0 standard drinks  . Drug use: Not Currently    Types: Marijuana, "Crack" cocaine    Comment: hx marijuana usemany years ago, Crack -none since 11/2015    Home Medications Prior to Admission medications   Medication Sig Start Date End Date Taking? Authorizing Provider  ACCU-CHEK SOFTCLIX LANCETS lancets TEST 4 TIMES DAILY 01/12/18   [name], MD  acetaminophen (TYLENOL) 500 MG tablet Take 1,000 mg by mouth 2 (two) times a day.     [provider]  albuterol (PROAIR HFA) 108 (90 Base) MCG/ACT inhaler INHALE 2 PUFFS INTO THE LUNGS EVERY 6 (SIX) HOURS AS NEEDED FOR SHORTNESS OF BREATH. 01/14/19   [name], MD  ammonium lactate (LAC-HYDRIN) 12 % lotion APPLY 1 APPLICATION TOPICALLY 2 (TWO) TIMES DAILY AS NEEDED FOR DRY SKIN. 12/10/18   [name], MD   atorvastatin (LIPITOR) 40 MG tablet TAKE 1 TABLET BY MOUTH EVERY DAY 12/29/18   [name], MD  b complex vitamins capsule Take 1 capsule by mouth daily.    [provider]  baclofen (LIORESAL) 10 MG tablet Take 1 tablet (10 mg total) by mouth 3 (three) times daily as needed for muscle spasms. Patient taking differently: Take 10 mg by mouth 3 (three) times daily.  03/24/18   [name], MD  blood glucose meter kit and supplies KIT Dispense based on patient and insurance preference. Use up to four times daily as directed. (FOR ICD-9 250.00, 250.01). 12/20/16   Bonnita Hollow, MD  busPIRone (BUSPAR) 10 MG tablet TAKE 1 TABLET BY MOUTH THREE TIMES A DAY Patient taking differently: Take 10 mg by mouth 3 (three) times daily.  06/25/18   Steve Rattler, DO  Calcium Citrate-Vitamin D (CALCIUM CITRATE + D3 PO) Take 1 tablet by mouth daily.    [provider]  Capsaicin-Menthol (PAIN RELIEF EX) Apply 1 application topically 4 (four) times daily as needed (shoulder pain).     [provider]  diclofenac Sodium (VOLTAREN) 1 % GEL Apply 2 g topically 4 (four) times daily. 12/17/18   Zenia Resides, MD  diltiazem (CARDIZEM CD) 120 MG 24 hr capsule Take 1 capsule (120 mg total) by mouth 2 (two) times daily. 12/21/18 12/16/19  [name], MD  docusate sodium (COLACE) 100 MG capsule Take 200 mg by mouth 2 (two) times daily.    [provider]  empagliflozin (JARDIANCE) 25 MG TABS tablet Take 25 mg by mouth daily.    [provider]  febuxostat (ULORIC) 40 MG tablet Take 40 mg by mouth at bedtime.  05/27/18   [provider]  Flaxseed, Linseed, (FLAXSEED OIL) 1000 MG CAPS Take 1,000 mg by mouth daily.    [provider]  fluticasone (FLONASE) 50 MCG/ACT nasal spray Place 2 sprays into both nostrils 2 (two) times daily. 07/21/18   [name], MD  Fluticasone-Umeclidin-Vilant (TRELEGY ELLIPTA) 100-62.5-25 MCG/INH AEPB Inhale 1  puff  into the lungs daily. 08/18/18   Garner Nash, DO  folic acid (FOLVITE) 235 MCG tablet Take 400 mcg by mouth daily.    [provider]  furosemide (LASIX) 40 MG tablet Take 2 tablets (80 mg total) by mouth 2 (two) times daily. 11/24/18   [name], MD  gabapentin (NEURONTIN) 300 MG capsule TAKE 2 CAPSULES (600 MG TOTAL) BY MOUTH 3 (THREE) TIMES DAILY. 08/24/18   Jessy Oto, MD  glucose blood (ACCU-CHEK AVIVA PLUS) test strip TEST 4 TIMES DAILY Patient taking differently: 1 each by Other route 3 (three) times daily. TEST 3 TIMES DAILY 08/04/18   [name], MD  HYDROcodone-acetaminophen (NORCO) 7.5-325 MG tablet Take 1 tablet by mouth every 6 (six) hours as needed for moderate pain. 01/27/18   [name], MD  hydrOXYzine (ATARAX/VISTARIL) 10 MG tablet TAKE 1 TABLET BY MOUTH EVERY DAY AS NEEDED Patient taking differently: Take 10 mg by mouth daily.  08/03/18   [name], MD  Insulin Glargine (LANTUS SOLOSTAR) 100 UNIT/ML Solostar Pen Inject 22 Units into the skin daily before breakfast. Patient taking differently: Inject 20 Units into the skin daily before breakfast.  06/02/18   Bufford Lope, DO  insulin lispro (HUMALOG KWIKPEN) 100 UNIT/ML KwikPen Inject 0.08 mLs (8 Units total) into the skin 3 (three) times daily before meals. 12/17/18   Zenia Resides, MD  Insulin Pen Needle (B-D UF III MINI PEN NEEDLES) 31G X 5 MM MISC CHECK SUGARS 4 TIMES A DAY BEFORE MEALS AND AT BEDTIME. 12/31/18   Hensel, Jamal Collin, MD  ketoconazole (NIZORAL) 2 % cream APPLY 1 FINGERTIP AMOUNT TO EACH FOOT DAILY. Patient taking differently: Apply 1 application topically 2 (two) times daily.  11/19/18   Gardiner Barefoot, DPM  loratadine (CLARITIN) 10 MG tablet Take 1 tablet (10 mg total) by mouth daily. 12/29/18   [name], MD  losartan (COZAAR) 100 MG tablet Take 1 tablet (100 mg total) by mouth daily. 05/29/17   [name], MD  metolazone (ZAROXOLYN) 2.5 MG tablet Take 1  tablet (2.5 mg total) by mouth 2 (two) times a week. TAKE 1 TABLET TWICE WEEKLY ON MONDAYS AND THURSDAYS ONLY 06/18/18   [name], MD  montelukast (SINGULAIR) 10 MG tablet TAKE 1 TABLET BY MOUTH EVERYDAY AT BEDTIME Patient taking differently: Take 10 mg by mouth at bedtime.  11/16/18   [name], MD  Multiple Vitamins-Minerals (MULTIVITAMIN WITH MINERALS) tablet Take 1 tablet by mouth daily.    [provider]  mupirocin ointment (BACTROBAN) 2 % APPLY TO AFFECTED AREA TWICE A DAY Patient taking differently: Apply 1 application topically 2 (two) times daily as needed (irritation).  09/09/18   [name], MD  NARCAN 4 MG/0.1ML LIQD nasal spray kit Place 1 spray into the nose once.  02/16/18   [provider]  nicotine polacrilex ([employer] NICOTINE POLACRILEX) 4 MG gum Take 1 each (4 mg total) by mouth as needed for smoking cessation. 12/21/18   Zenia Resides, MD  NON FORMULARY Uses a C-PAP at bedtime    [provider]  nystatin (MYCOSTATIN) 100000 UNIT/ML suspension Take 5 mLs (500,000 Units total) by mouth 4 (four) times daily. Swish and swallow. Patient taking differently: Take 5 mLs by mouth daily as needed (thrush). Swish and swallow. 11/19/18   Zenia Resides, MD  Omega-3 Fatty Acids (FISH OIL) 1000 MG CAPS Take 1,000 mg by mouth daily.     [provider]  omeprazole (PRILOSEC) 20  MG capsule Take 1 capsule (20 mg total) by mouth daily as needed. Patient taking differently: Take 20 mg by mouth daily.  02/04/18   [name], MD  phenazopyridine (PYRIDIUM) 200 MG tablet Take 1 tablet (200 mg total) by mouth 3 (three) times daily as needed (for pain with urination). 12/29/18 12/29/19  Ceasar Mons, MD  potassium chloride SA (KLOR-CON M20) 20 MEQ tablet Take 1 tablet (20 mEq total) by mouth 2 (two) times a week. Take 1 tablet twice weekly on Monday and Thursday. 05/18/18   [name], MD  promethazine (PHENERGAN) 25  MG tablet Take 1 tablet (25 mg total) by mouth every 6 (six) hours as needed for nausea. 01/19/18   [name], MD  Semaglutide,0.25 or 0.5MG/DOS, (OZEMPIC, 0.25 OR 0.5 MG/DOSE,) 2 MG/1.5ML SOPN Inject 0.25 mg into the skin once a week. Inject 0.25 mg into the skin once weekly on Thursdays. (minus 2 clicks) 96/78/93   Zenia Resides, MD  sertraline (ZOLOFT) 25 MG tablet Take 1 tablet (25 mg total) by mouth daily. 12/01/18 03/01/19  [name], MD  sertraline (ZOLOFT) 50 MG tablet TAKE 1 TABLET BY MOUTH EVERY DAY Patient taking differently: Take 50 mg by mouth daily.  11/09/18   [name], MD  TOVIAZ 8 MG TB24 tablet Take 8 mg by mouth at bedtime.  08/31/18   [provider]  varenicline (CHANTIX) 0.5 MG tablet Take 1 tablet (0.5 mg total) by mouth 2 (two) times daily. 01/11/19   [name], MD  atorvastatin (LIPITOR) 40 MG tablet Take 1 tablet (40 mg total) by mouth daily. 12/31/17   [name], MD  CHANTIX 0.5 MG tablet TAKE 1 TABLET (0.5 MG TOTAL) BY MOUTH 2 (TWO) TIMES DAILY. 10/29/18   [name], MD    Allergies    Aspirin, Chantix [varenicline tartrate], Methadone hcl, Corticosteroids, and Levofloxacin  Review of Systems   Review of Systems  All other systems reviewed and are negative.   Physical Exam Updated Vital Signs BP (!) 163/92 (BP Location: Right Arm)   Pulse 93   Temp 98.1 F (36.7 C) (Oral)   Resp 18   Ht _0  (1.778 m)   Wt (!) 167.7 kg   LMP 04/10/2006   SpO2 93%   BMI 53.06 kg/m   Physical Exam Vitals and nursing note reviewed.  Constitutional:      Appearance: She is well-developed.     Comments: Elevated BMI  HENT:     Head: Normocephalic and atraumatic.  Eyes:     Conjunctiva/sclera: Conjunctivae normal.  Abdominal:     General: Bowel sounds are normal.     Palpations: Abdomen is soft.  Musculoskeletal:        General: Normal range of motion.     Cervical back: Neck supple.     Comments: Tender left lateral  hip.  Pain with ambulation.  Skin:    General: Skin is warm and dry.  Neurological:     General: No focal deficit present.     Mental Status: She is alert and oriented to person, place, and time.  Psychiatric:        Behavior: Behavior normal.     ED Results / Procedures / Treatments   Labs (all labs ordered are listed, but only abnormal results are displayed) Labs Reviewed - No data to display  EKG None  Radiology DG Hip Unilat W or Wo Pelvis 2-3 Views Right  Result Date: 01/14/2019 CLINICAL DATA:  Right  hip pain for 6 days.  No known injury. EXAM: DG HIP (WITH OR WITHOUT PELVIS) 2-3V RIGHT COMPARISON:  None. FINDINGS: Advanced right hip joint degenerative changes for age with joint space narrowing, osteophytic spurring and subchondral cystic change with bony eburnation. Moderate degenerative changes on the left. No definite fracture or AVN. The pubic symphysis and SI joints are intact. No pelvic fractures or bone lesions. Lower lumbar fusion hardware noted. IMPRESSION: Advanced right hip joint degenerative changes without definite fracture or AVN. Electronically Signed   By: Marijo Sanes M.D.   On: 01/14/2019 12:07   DG Femur Min 2 Views Right  Result Date: 01/14/2019 CLINICAL DATA:  Right leg pain. EXAM: RIGHT FEMUR 2 VIEWS COMPARISON:  None. FINDINGS: The right hip joint demonstrates advanced degenerative changes for age. The right knee joint demonstrates mild degenerative changes. The right femur is intact. No fracture or bone lesion. IMPRESSION: No acute bony findings. Electronically Signed   By: Marijo Sanes M.D.   On: 01/14/2019 12:08    Procedures Procedures (including critical care time)  Medications Ordered in ED Medications  HYDROmorphone (DILAUDID) injection 1 mg (has no administration in time range)    ED Course  I have reviewed the triage vital signs and the nursing notes.  Pertinent labs & imaging results that were available during my care of the patient  were reviewed by me and considered in my medical decision making (see chart for details).    MDM Rules/Calculators/A&P                      Plain films of right hip reveal advanced degenerative changes.  Patient cannot take NSAIDs or prednisone.  Dilaudid 1 mg IM.  Primary care and/or orthopedic follow-up. Final Clinical Impression(s) / ED Diagnoses Final diagnoses:  Right hip pain    Rx / DC Orders ED Discharge Orders    None       Nat Christen, MD 01/14/19 1426

## 2019-01-15 ENCOUNTER — Encounter: Payer: Self-pay | Admitting: Specialist

## 2019-01-15 ENCOUNTER — Ambulatory Visit: Payer: Medicare Other | Admitting: Podiatry

## 2019-01-15 ENCOUNTER — Telehealth: Payer: Self-pay | Admitting: Family Medicine

## 2019-01-15 ENCOUNTER — Ambulatory Visit (HOSPITAL_COMMUNITY): Payer: Medicare Other

## 2019-01-15 NOTE — Telephone Encounter (Signed)
Patient would like Dr. Valentina Lucks to call her to discuss an issue with her Ozemtic (insulin).  Please call her at [phone].

## 2019-01-15 NOTE — Telephone Encounter (Signed)
Call returned to patient RE supply of Ozempic following report of her using last dose of medication.   She reports doing well with Ozempic - blood sugars are well controlled.  Current dose is 0.25mg  minus two clicks.   She also reports NO SMOKING for 15 days!  Medication Samples have been provided to the patient - labeled for pick-up.  Drug name: Ozempic(semaglutide) Pen        Qty: 1  LOT: [id number]  Exp.Date: 02/27/2021  Dosing instructions: same dose as previous The patient has been instructed regarding the correct time, dose, and frequency of taking this medication, including desired effects and most common side effects.   Janeann Forehand 12:07 PM 01/15/2019  Encouraged to continue with complete tobacco cessation.

## 2019-01-17 ENCOUNTER — Encounter: Payer: Self-pay | Admitting: Orthopaedic Surgery

## 2019-01-20 ENCOUNTER — Telehealth (HOSPITAL_COMMUNITY): Payer: Self-pay

## 2019-01-20 ENCOUNTER — Ambulatory Visit (INDEPENDENT_AMBULATORY_CARE_PROVIDER_SITE_OTHER): Payer: Medicare Other | Admitting: Orthopaedic Surgery

## 2019-01-20 ENCOUNTER — Ambulatory Visit: Payer: Self-pay

## 2019-01-20 ENCOUNTER — Other Ambulatory Visit: Payer: Self-pay

## 2019-01-20 DIAGNOSIS — M25551 Pain in right hip: Secondary | ICD-10-CM | POA: Diagnosis not present

## 2019-01-20 NOTE — Progress Notes (Signed)
Office Visit Note   Patient: [name]           Date of Birth: 1965-03-21           MRN: [id number] Visit Date: 01/20/2019              Requested by: [name], MD [address],  [name] [address] PCP: [name], MD   Assessment & Plan: Visit Diagnoses:  1. Pain in right hip     Plan: Impression is moderately severe right hip DJD exacerbation.  We will arrange for a cortisone injection with Dr. Junius Roads today.  Follow-up as needed.  Follow-Up Instructions: No follow-ups on file.   Orders:  Orders Placed This Encounter  Procedures  . US Guided Needle Placement - No Linked Charges   No orders of the defined types were placed in this encounter.     Procedures: No procedures performed   Clinical Data: No additional findings.   Subjective: Chief Complaint  Patient presents with  . Right Hip - Pain    [name] comes in for evaluation of right hip pain.  She states that she started having severe pain in her right hip about 2 weeks ago.  Denies any injuries.  She is in a wheelchair today.  Denies any back pain or radicular symptoms.   Review of Systems  Constitutional: Negative.   HENT: Negative.   Eyes: Negative.   Respiratory: Negative.   Cardiovascular: Negative.   Endocrine: Negative.   Musculoskeletal: Negative.   Neurological: Negative.   Hematological: Negative.   Psychiatric/Behavioral: Negative.   All other systems reviewed and are negative.    Objective: Vital Signs: LMP 04/10/2006   Physical Exam Vitals and nursing note reviewed.  Constitutional:      Appearance: She is well-developed.  Pulmonary:     Effort: Pulmonary effort is normal.  Skin:    General: Skin is warm.     Capillary Refill: Capillary refill takes less than 2 seconds.  Neurological:     Mental Status: She is alert and oriented to person, place, and time.  Psychiatric:        Behavior: Behavior normal.        Thought Content: Thought content  normal.        Judgment: Judgment normal.     Ortho Exam Right hip exam shows significant pain with internal and external rotation.  No tenderness to palpation. Specialty Comments:  No specialty comments available.  Imaging: US Guided Needle Placement - No Linked Charges  Result Date: 01/20/2019 Please see Notes tab for imaging impression.    PMFS History: Patient Active Problem List   Diagnosis Date Noted  . Nontraumatic tear of left supraspinatus tendon 12/30/2018  . Gout 12/17/2018  . Nontraumatic complete tear of right rotator cuff 12/08/2018  . Rotator cuff tear, right 12/01/2018  . Chest pain 09/24/2018  . Right groin pain 07/20/2018  . Depressed mood 07/13/2018  . Mouth pain 06/02/2018  . Paroxysmal SVT (supraventricular tachycardia) ([place]) 03/20/2018  . Pure hypercholesterolemia 03/20/2018  . Muscle cramping 02/25/2018  . Nodule of upper lobe of right lung 02/13/2018  . Falls, subsequent encounter 01/30/2018  . Pain in left foot 11/04/2017  . Open wound of left foot 11/04/2017  . Essential hypertension 10/27/2017  . Solitary pulmonary nodule 10/07/2017  . Restrictive lung disease secondary to obesity 10/01/2017  . Polycythemia, secondary 10/01/2017  . Chronic viral hepatitis B without delta-agent ([place]) 07/08/2017  . COPD (chronic obstructive  pulmonary disease) with chronic bronchitis ([place]) 06/27/2017  . Chronic kidney disease (CKD), stage IV (severe) ([place]) 01/14/2017  . Type 2 diabetes mellitus with stage 3 chronic kidney disease, with long-term current use of insulin ([place]) 12/12/2016  . Urge incontinence of urine 12/05/2016  . Trigger index finger of left hand 07/15/2016  . Back pain 04/07/2014  . Morbid obesity ([place]) 10/02/2012  . Chronic diastolic CHF (congestive heart failure) ([place]) 04/07/2012  . GERD 01/26/2010  . Obstructive sleep apnea treated with BiPAP 02/03/2008  . FIBROCYSTIC BREAST DISEASE 10/28/2006  . Hyperlipidemia 03/27/2006  . Obesity  hypoventilation syndrome ([place]) 03/27/2006  . Former tobacco use 03/27/2006  . Depression with anxiety 03/27/2006   Past Medical History:  Diagnosis Date  . Anxiety   . Arthritis 04-10-11   hips, shoulders, back  . Asthma   . Bipolar disorder ([place])   . Cancer ([place]) 1993   cervical, no treatment done, went away per pt  . Cardiomegaly   . Cervical dysplasia or atypia 04-10-11   '93- once dx.-got pregnant-no intervention, then postpartum, no dysplasia found  . CHF (congestive heart failure) ([place])    no cardiologist 2014 dx, none now  . Chronic hypoxemic respiratory failure ([place])   . Chronic kidney disease    stage 3 kidney disease  . 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 Pathology showed Condyloma, gluteal CONDYLOMA ACUMINATUM.   [name] Kitchen COPD (chronic obstructive pulmonary disease) ([place])   . Diabetes mellitus without complication ([place])   . Dyspnea    with actity, sitting  . Fall 12/16/2016  . Fibrocystic breast disease   . GERD (gastroesophageal reflux disease)   . Grade I diastolic dysfunction 27/78/2423   Noted on ECHO  . Hepatitis    hep B-count is low at present,doesn't register  . History of PSVT (paroxysmal supraventricular tachycardia)   . Hypercalcemia   . Hyperlipidemia   . Hypertension   . Incomplete left bundle branch block (LBBB) 09/24/2018   Noted on EKG  . Lung nodule 11/2017   right and left lung  . Migraine headache    none recent  . Morbid obesity ([place]) 03/27/2006  . Neuropathy   . Pneumonia    walking pneu 15 yrs. ago  . Skin lesion 03/15/2011   In gluteal crease now s/p removal by Dr. [name] of General Surgery on 3/12. Path shows condyloma.     . Sleep apnea 04-10-11   uses cpap, pt does not know settings  . Sleep apnea   . Trigger finger    left third  . Urge incontinence of urine     Family History  Problem Relation Age of Onset  . Pulmonary embolism Mother   . Stroke Father   . Alcohol abuse Father   . Pneumonia Father     . Cancer Maternal Aunt        breast  . Breast cancer Maternal Aunt   . Asthma Other   . COPD Other   . Hypertension Other   . Diabetes Other   . Asthma Sister   . Depression Sister   . Arthritis Sister   . Sickle cell anemia Son     Past Surgical History:  Procedure Laterality Date  . ANAL FISTULECTOMY  04/17/2011   Procedure: FISTULECTOMY ANAL;  Surgeon: Imogene Burn. [name], MD;  Location: [facility];  Service: General;  Laterality: N/A;  Excision of Condyloma Gluteal Cleft   . BACK SURGERY  04-10-11   x5-Lumbar fusion-retained  hardware.(Dr. Louanne Skye)  . BOTOX INJECTION N/A 12/29/2018   Procedure: BOTOX INJECTION(100 UNITS)  WITH CYSTOSCOPY;  Surgeon: Ceasar Mons, MD;  Location: [facility];  Service: Urology;  Laterality: N/A;  . BREAST CYST EXCISION     bilateral breast, 3 cysts removed from each breast  . BREAST EXCISIONAL BIOPSY Right    x 3  . BREAST EXCISIONAL BIOPSY Left    x 3  . CARPAL TUNNEL RELEASE Bilateral   . Cervical biospy    . COLONOSCOPY WITH PROPOFOL N/A 07/06/2015   Procedure: COLONOSCOPY WITH PROPOFOL;  Surgeon: [name], MD;  Location: [facility];  Service: Endoscopy;  Laterality: N/A;  . DOPPLER ECHOCARDIOGRAPHY  04/06/2012   AT [place] 55-60%  . FRACTURE SURGERY     little toe left foot  . I & D EXTREMITY Left 12/18/2016   Procedure: IRRIGATION AND DEBRIDEMENT LEFT FOOT, CLOSURE;  Surgeon: [name], MD;  Location: [facility];  Service: Orthopedics;  Laterality: Left;  [name] Kitchen MULTIPLE TOOTH EXTRACTIONS    . NECK SURGERY  04-10-11   x3- cervical fusion with plating and screws-Dr. [name]  . TEE WITHOUT CARDIOVERSION N/A 04/06/2012   Procedure: TRANSESOPHAGEAL ECHOCARDIOGRAM (TEE);  Surgeon: [name], MD;  Location: [facility];  Service: Cardiovascular;  Laterality: N/A;  . TRIGGER FINGER RELEASE Right    middle finger  . TRIGGER FINGER RELEASE Left 06/28/2016   Procedure: RELEASE TRIGGER FINGER LEFT 3RD FINGER;  Surgeon: [name], MD;   Location: [facility];  Service: Orthopedics;  Laterality: Left;  . TRIGGER FINGER RELEASE Right 06/12/2016   Procedure: RIGHT INDEX FINGER TRIGGER RELEASE;  Surgeon: [name], MD;  Location: [facility];  Service: Orthopedics;  Laterality: Right;  . TRIGGER FINGER RELEASE Right 01/19/2018   Procedure: RIGHT RING FINGER TRIGGER FINGER RELEASE;  Surgeon: [name], MD;  Location: [facility];  Service: Orthopedics;  Laterality: Right;   Social History   Occupational History    Comment: Disabled  Tobacco Use  . Smoking status: Current Every Day Smoker    Packs/day: 0.20    Years: 40.00    Pack years: 8.00    Types: Cigarettes    Start date: 01/28/1977    Last attempt to quit: 10/19/2018    Years since quitting: 0.2  . Smokeless tobacco: Never Used  . Tobacco comment: Quit 8 months 2019 - restart 05/2018   Hx 1.5 PPD  Substance and Sexual Activity  . Alcohol use: No    Alcohol/week: 0.0 standard drinks  . Drug use: Not Currently    Types: Marijuana, "Crack" cocaine    Comment: hx marijuana usemany years ago, Crack -none since 11/2015  . Sexual activity: Not Currently    Partners: Male    Birth control/protection: Post-menopausal

## 2019-01-20 NOTE — Telephone Encounter (Signed)
Encounter complete. 

## 2019-01-20 NOTE — Progress Notes (Signed)
Subjective: Patient is here for ultrasound-guided intra-articular right hip injection.   Severe groin pain, hardly able to bear weight.  Objective:  Pain and decreased ROM with IR.  Procedure: Ultrasound-guided right hip injection: After sterile prep with Betadine, injected 8 cc 1% lidocaine without epinephrine and 40 mg methylprednisolone using a 22-gauge spinal needle, passing the needle through the iliofemoral ligament into the femoral head/neck junction.  Difficult injection, but able to see injectate filling capsule.  Modest immediate improvement.

## 2019-01-25 DIAGNOSIS — M25551 Pain in right hip: Secondary | ICD-10-CM | POA: Diagnosis not present

## 2019-01-25 DIAGNOSIS — Z79899 Other long term (current) drug therapy: Secondary | ICD-10-CM | POA: Diagnosis not present

## 2019-01-25 DIAGNOSIS — G894 Chronic pain syndrome: Secondary | ICD-10-CM | POA: Diagnosis not present

## 2019-01-25 NOTE — Telephone Encounter (Signed)
Noted and agree. 

## 2019-01-26 ENCOUNTER — Telehealth (HOSPITAL_COMMUNITY): Payer: Self-pay

## 2019-01-26 NOTE — Telephone Encounter (Signed)
Encounter complete. 

## 2019-01-27 ENCOUNTER — Encounter (HOSPITAL_COMMUNITY): Payer: Self-pay

## 2019-01-27 ENCOUNTER — Ambulatory Visit (HOSPITAL_COMMUNITY): Payer: Medicare Other

## 2019-01-28 ENCOUNTER — Ambulatory Visit (HOSPITAL_COMMUNITY): Payer: Medicare Other

## 2019-01-28 ENCOUNTER — Encounter (HOSPITAL_COMMUNITY): Payer: Self-pay

## 2019-02-02 ENCOUNTER — Encounter: Payer: Self-pay | Admitting: Family Medicine

## 2019-02-02 DIAGNOSIS — J302 Other seasonal allergic rhinitis: Secondary | ICD-10-CM

## 2019-02-03 ENCOUNTER — Other Ambulatory Visit: Payer: Self-pay | Admitting: Family Medicine

## 2019-02-03 MED ORDER — FLUTICASONE PROPIONATE 50 MCG/ACT NA SUSP: 2.0000 | Freq: Two times a day (BID) | NASAL | 6 refills | Status: DC

## 2019-02-03 NOTE — Telephone Encounter (Signed)
Is she using a cane or walker? Loosing weight off loading weight and arthritis meds are the best treatment. We should refer her to Dr. Ninfa Linden to consider a total hip replacement. I have stopped doing these years ago. Her weight may prevent her from being considered.

## 2019-02-04 ENCOUNTER — Other Ambulatory Visit: Payer: Self-pay | Admitting: Student in an Organized Health Care Education/Training Program

## 2019-02-04 DIAGNOSIS — M5090 Cervical disc disorder, unspecified, unspecified cervical region: Secondary | ICD-10-CM | POA: Diagnosis not present

## 2019-02-04 DIAGNOSIS — J9621 Acute and chronic respiratory failure with hypoxia: Secondary | ICD-10-CM | POA: Diagnosis not present

## 2019-02-04 DIAGNOSIS — G4733 Obstructive sleep apnea (adult) (pediatric): Secondary | ICD-10-CM | POA: Diagnosis not present

## 2019-02-04 DIAGNOSIS — J302 Other seasonal allergic rhinitis: Secondary | ICD-10-CM

## 2019-02-04 NOTE — Telephone Encounter (Signed)
Patient has been seen by Dr. Erlinda Hong for her hip

## 2019-02-08 ENCOUNTER — Encounter: Payer: Self-pay | Admitting: Family Medicine

## 2019-02-08 ENCOUNTER — Other Ambulatory Visit: Payer: Medicare Other

## 2019-02-08 NOTE — Telephone Encounter (Signed)
Spoke with patient regarding rectal issue. She informed me that she called the office and made an appointment for Friday.

## 2019-02-11 DIAGNOSIS — M109 Gout, unspecified: Secondary | ICD-10-CM | POA: Diagnosis not present

## 2019-02-11 DIAGNOSIS — E1122 Type 2 diabetes mellitus with diabetic chronic kidney disease: Secondary | ICD-10-CM | POA: Diagnosis not present

## 2019-02-11 DIAGNOSIS — Z79899 Other long term (current) drug therapy: Secondary | ICD-10-CM | POA: Diagnosis not present

## 2019-02-11 DIAGNOSIS — R768 Other specified abnormal immunological findings in serum: Secondary | ICD-10-CM | POA: Diagnosis not present

## 2019-02-11 DIAGNOSIS — M150 Primary generalized (osteo)arthritis: Secondary | ICD-10-CM | POA: Diagnosis not present

## 2019-02-12 ENCOUNTER — Encounter: Payer: Self-pay | Admitting: Family Medicine

## 2019-02-12 ENCOUNTER — Other Ambulatory Visit: Payer: Self-pay | Admitting: Student in an Organized Health Care Education/Training Program

## 2019-02-12 ENCOUNTER — Ambulatory Visit (INDEPENDENT_AMBULATORY_CARE_PROVIDER_SITE_OTHER): Payer: Medicare Other | Admitting: Family Medicine

## 2019-02-12 ENCOUNTER — Telehealth: Payer: Self-pay | Admitting: Family Medicine

## 2019-02-12 ENCOUNTER — Other Ambulatory Visit: Payer: Self-pay

## 2019-02-12 VITALS — BP 118/68 | HR 57 | Wt 365.4 lb

## 2019-02-12 DIAGNOSIS — K6289 Other specified diseases of anus and rectum: Secondary | ICD-10-CM | POA: Insufficient documentation

## 2019-02-12 DIAGNOSIS — M5442 Lumbago with sciatica, left side: Secondary | ICD-10-CM

## 2019-02-12 DIAGNOSIS — M1611 Unilateral primary osteoarthritis, right hip: Secondary | ICD-10-CM | POA: Insufficient documentation

## 2019-02-12 DIAGNOSIS — G4733 Obstructive sleep apnea (adult) (pediatric): Secondary | ICD-10-CM | POA: Diagnosis not present

## 2019-02-12 DIAGNOSIS — M5090 Cervical disc disorder, unspecified, unspecified cervical region: Secondary | ICD-10-CM | POA: Diagnosis not present

## 2019-02-12 MED ORDER — HYDROCORT-PRAMOXINE (PERIANAL) 1-1 % EX FOAM: 1.0000 | Freq: Two times a day (BID) | CUTANEOUS | 0 refills | Status: DC

## 2019-02-12 MED ORDER — DICLOFENAC SODIUM 1 % EX GEL: 2.0000 g | Freq: Four times a day (QID) | CUTANEOUS | 1 refills | Status: DC

## 2019-02-12 NOTE — Progress Notes (Signed)
Subjective:  [name] is a 54 y.o. female who presents to the [facility] today with a chief complaint of rectal issue.   HPI: Rectal discomfort Patient called the clinic on 02/08/2019 and reported that she was having a bowel movement and sneezed and "I sneezed and a big knot came on the inside of my butt hole".  She reports that since last week she has still felt a lump on the right side of her rectum.  It is internal and she notices it more when she has bowel movements as well as when she blows her nose.  She reports that she is never had issues with this in the past.  Most recent colonoscopy was 07/06/2015 and showed no hemorrhoids.  Chronic back pain Patient reports that she has chronic pain from her shoulders down to her feet.  She reports that she uses Voltaren gel on her whole body which makes the pain manageable and is requesting a refill on these medications.  She also says that her shoulder pain is been identified as rotator cuff tears and that she is scheduled for surgery for that.  She has also been told that she will receive hip surgery once she loses 30 pounds. -Voltaren gel prescription refilled  Osteoarthritis of the right hip Patient reports that she was recently seen by the orthopedist who diagnosed her with osteoarthritis the right hip.  She reports "bone-on-bone" seen on scans.  She has been told that she needs a hip replacement but is not eligible for one until she loses 30 pounds.  She has lost 4 pounds so far.  She says that she was seen by physical therapy who recommended she get a "rolling walker with 4 wheels, a seat, and a basket". -DME order placed for rolling walker Objective:  Physical Exam: BP 118/68   Pulse (!) 57   Wt (!) 365 lb 6.4 oz (165.7 kg)   LMP 04/10/2006   SpO2 95%   BMI 52.43 kg/m   Gen: No acute distress Pulm: NWOB Rectal: No visible external hemorrhoids.  Digital exam showed small amount of stool in rectal vault but no internal hemorrhoids  noted and no other masses noted. MSK: Patient having difficulty ambulating on right hip and difficulty standing. Skin: warm, dry Neuro: grossly normal, moves all extremities Psych: Normal affect and thought content  No results found. However, due to the size of the patient record, not all encounters were searched. Please check Results Review for a complete set of results.   Assessment/Plan:  Rectal discomfort Patient reports that he was having a bowel movement and blew her nose and felt a mass grew on the inside of the rectum.  It occurred on 02/08/2019.  Since then she has felt the mass on the inside of her rectum on her right side.  It is very uncomfortable but not painful.  She denies any bleeding with bowel movements.  She reports that she has never had hemorrhoids.  On exam we noticed no external or internal hemorrhoids.  We noticed no other masses.  There was stool noted in the rectal vault. -We will prescribe patient Proctofoam with applicator -Recommend sitz bath or warm compress on rectum to alleviate discomfort -Consider GI referral if this discomfort persists  Back pain Patient reports that she is still having chronic back pain which she uses Voltaren gel for.  She says that she uses it a lot and covers herself from her neck down to her legs.  She is  requesting a refill on the Voltaren gel because it helps make the pain manageable.  She notes that she is having shoulder surgery on both surgeries and will have hip surgery when she loses 30 pounds. -Refilled Voltaren gel prescription  Osteoarthritis of right hip Patient reports that she has been diagnosed with osteoarthritis of the right hip and will receive hip surgery after she loses 30 pounds. Was evaluated by physical therapy and was recommended a rolling walker with seat and basket.  - DME order placed for walker with 4 wheels and basket    Lab Orders  No laboratory test(s) ordered today    Meds ordered this encounter   Medications  . hydrocortisone-pramoxine (PROCTOFOAM-HC) rectal foam    Sig: Place 1 applicator rectally 2 (two) times daily.    Dispense:  10 g    Refill:  0  . diclofenac Sodium (VOLTAREN) 1 % GEL    Sig: Apply 2 g topically 4 (four) times daily.    Dispense:  500 g    Refill:  1      [name], MD  PGY-1, [facility]  02/12/19 2:34 PM\

## 2019-02-12 NOTE — Assessment & Plan Note (Addendum)
Patient reports that he was having a bowel movement and blew her nose and felt a mass grew on the inside of the rectum.  It occurred on 02/08/2019.  Since then she has felt the mass on the inside of her rectum on her right side.  It is very uncomfortable but not painful.  She denies any bleeding with bowel movements.  She reports that she has never had hemorrhoids.  On exam we noticed no external or internal hemorrhoids.  We noticed no other masses.  There was stool noted in the rectal vault. -We will prescribe patient Proctofoam with applicator -Recommend sitz bath or warm compress on rectum to alleviate discomfort -Consider GI referral if this discomfort persists

## 2019-02-12 NOTE — Telephone Encounter (Signed)
I placed DME order for 4 wheeled rolling walker with seat.

## 2019-02-12 NOTE — Patient Instructions (Signed)
It was a pleasure seeing you today.  I am sorry that we could not identify any mass in your rectum.  We are going to give you some gel to apply to help with the discomfort.  Please let us know if this helps and if it does not we may consider further work-up.  I also encourage you to do sitz bath or your rectum is a normal letter and if you are unable to do this is a warm wet cloth.  I also refilled your prescription for your Voltaren gel.  Hope you have a wonderful afternoon.  How to Take a [employer] A sitz bath is a warm water bath that may be used to care for your rectum, genital area, or the area between your rectum and genitals (perineum). For a sitz bath, the water only comes up to your hips and covers your buttocks. A sitz bath may done at home in a bathtub or with a portable sitz bath that fits over the toilet. Your health care provider may recommend a sitz bath to help:  Relieve pain and discomfort after delivering a baby.  Relieve pain and itching from hemorrhoids or anal fissures.  Relieve pain after certain surgeries.  Relax muscles that are sore or tight. How to take a sitz bath Take 3-4 sitz baths a day, or as many as told by your health care provider. Bathtub sitz bath To take a sitz bath in a bathtub: 1. Partially fill a bathtub with warm water. The water should be deep enough to cover your hips and buttocks when you are sitting in the tub. 2. If your health care provider told you to put medicine in the water, follow his or her instructions. 3. Sit in the water. 4. Open the tub drain a little, and leave it open during your bath. 5. Turn on the warm water again, enough to replace the water that is draining out. Keep the water running throughout your bath. This helps keep the water at the right level and the right temperature. 6. Soak in the water for 15-20 minutes, or as long as told by your health care provider. 7. When you are done, be careful when you stand up. You may feel  dizzy. 8. After the sitz bath, pat yourself dry. Do not rub your skin to dry it.  Over-the-toilet sitz bath To take a sitz bath with an over-the-toilet basin: 1. Follow the manufacturer's instructions. 2. Fill the basin with warm water. 3. If your health care provider told you to put medicine in the water, follow his or her instructions. 4. Sit on the seat. Make sure the water covers your buttocks and perineum. 5. Soak in the water for 15-20 minutes, or as long as told by your health care provider. 6. After the sitz bath, pat yourself dry. Do not rub your skin to dry it. 7. Clean and dry the basin between uses. 8. Discard the basin if it cracks, or according to the manufacturer's instructions. Contact a health care provider if:  Your symptoms get worse. Do not continue with sitz baths if your symptoms get worse.  You have new symptoms. If this happens, do not continue with sitz baths until you talk with your health care provider. Summary  A sitz bath is a warm water bath in which the water only comes up to your hips and covers your buttocks.  A sitz bath may help relieve itching, relieve pain, and relax muscles that are sore or tight  in the lower part of your body, including your genital area.  Take 3-4 sitz baths a day, or as many as told by your health care provider. Soak in the water for 15-20 minutes.  Do not continue with sitz baths if your symptoms get worse. This information is not intended to replace advice given to you by your health care provider. Make sure you discuss any questions you have with your health care provider. Document Revised: 06/15/2018 Document Reviewed: 01/16/2017 Elsevier Patient Education  [place].

## 2019-02-12 NOTE — Assessment & Plan Note (Signed)
Patient reports that she is still having chronic back pain which she uses Voltaren gel for.  She says that she uses it a lot and covers herself from her neck down to her legs.  She is requesting a refill on the Voltaren gel because it helps make the pain manageable.  She notes that she is having shoulder surgery on both surgeries and will have hip surgery when she loses 30 pounds. -Refilled Voltaren gel prescription

## 2019-02-12 NOTE — Assessment & Plan Note (Signed)
Patient reports that she has been diagnosed with osteoarthritis of the right hip and will receive hip surgery after she loses 30 pounds. Was evaluated by physical therapy and was recommended a rolling walker with seat and basket.  - DME order placed for walker with 4 wheels and basket

## 2019-02-15 ENCOUNTER — Other Ambulatory Visit: Payer: Self-pay | Admitting: Family Medicine

## 2019-02-15 ENCOUNTER — Encounter: Payer: Self-pay | Admitting: Family Medicine

## 2019-02-15 DIAGNOSIS — K6289 Other specified diseases of anus and rectum: Secondary | ICD-10-CM

## 2019-02-15 NOTE — Telephone Encounter (Signed)
Sent community message to [facility] about this DME order for pt.[name], CMA

## 2019-02-15 NOTE — Progress Notes (Signed)
I spoke with the patient on the phone she is requesting referral to GI for further evaluation for her rectal issue.  She reports she may go to a hospital tomorrow because she is concerned about it.

## 2019-02-16 ENCOUNTER — Encounter: Payer: Self-pay | Admitting: Gastroenterology

## 2019-02-16 ENCOUNTER — Ambulatory Visit (INDEPENDENT_AMBULATORY_CARE_PROVIDER_SITE_OTHER): Payer: Medicare Other | Admitting: Gastroenterology

## 2019-02-16 VITALS — BP 110/70 | HR 88 | Temp 97.5°F | Ht 67.5 in | Wt 363.0 lb

## 2019-02-16 DIAGNOSIS — K6289 Other specified diseases of anus and rectum: Secondary | ICD-10-CM | POA: Diagnosis not present

## 2019-02-16 DIAGNOSIS — J441 Chronic obstructive pulmonary disease with (acute) exacerbation: Secondary | ICD-10-CM | POA: Diagnosis not present

## 2019-02-16 NOTE — Progress Notes (Signed)
Referring Provider: Lind Covert, * Primary Care Physician:  [name], MD  Reason for Consultation:  Rectal knot   IMPRESSION:  Rectal fullness without rectal bleeding BMI 56 COPD on oxygen  Acute onset of rectal fullness of unclear etiology. Patient declined rectal examination today. Wishes to pursue colonoscopy instead. Differential is concerning for polyp or mass.  Unfortunately, Covid restrictions are limiting outpatient endoscopy at the hospital. Will proceed as soon as the restrictions are lifted. In the meantime, I've recommended a daily stool bulking agent such as Metamucil.   PLAN: Use a daily dose of stool bulking agent such as Metamucil Colonoscopy at the hospital  The nature of the procedure, as well as the risks, benefits, and alternatives were carefully and thoroughly reviewed with the patient. Ample time for discussion and questions allowed. The patient understood, was satisfied, and agreed to proceed.  Please see the "Patient Instructions" section for addition details about the plan.  HPI: [name] is a 54 y.o. female referred by Dr. Caron Presume for a change at her rectum. The history is obtained through the patient and review of her electronic health record.   On  02/08/2019, while having a bowel movement,  "I sneezed and a big knot came on the inside of my butt hole".  She has felt a lump on the inside of rectum on the right since that time. Most aware of the fullness when defecating and blowing her nose. She is aware of the fullness but denies any pain. Feels that the fullness increased in size after her evaluation by Dr. Caron Presume.   No history of similar symptoms. No blood or mucous in the stool. No history of rectal trauma or foreign body. No constipation or diarrhea. No straining. No sense of incomplete evacuation. No history of manual assistance with defecation.   No blood or mucous. No history of similar symptoms in the past. No other  associated symptoms. No identified exacerbating or relieving features.   No hemorrhoids on a normal screening colonoscopy with Dr. Amedeo Plenty 07/06/15  No known family history of colon cancer or polyps. No family history of uterine/endometrial cancer, pancreatic cancer or gastric/stomach cancer.   Past Medical History:  Diagnosis Date  . Anxiety   . Arthritis 04-10-11   hips, shoulders, back  . Asthma   . Bipolar disorder ([place])   . Cancer ([place]) 1993   cervical, no treatment done, went away per pt  . Cardiomegaly   . Cervical dysplasia or atypia 04-10-11   '93- once dx.-got pregnant-no intervention, then postpartum, no dysplasia found  . CHF (congestive heart failure) ([place])    no cardiologist 2014 dx, none now  . Chronic hypoxemic respiratory failure ([place])   . Chronic kidney disease    stage 3 kidney disease  . 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 Pathology showed Condyloma, gluteal CONDYLOMA ACUMINATUM.   [name] Kitchen COPD (chronic obstructive pulmonary disease) ([place])   . Diabetes mellitus without complication ([place])   . Dyspnea    with actity, sitting  . Fall 12/16/2016  . Fibrocystic breast disease   . GERD (gastroesophageal reflux disease)   . Grade I diastolic dysfunction 49/82/6415   Noted on ECHO  . Hepatitis    hep B-count is low at present,doesn't register  . History of PSVT (paroxysmal supraventricular tachycardia)   . Hypercalcemia   . Hyperlipidemia   . Hypertension   . Incomplete left bundle branch block (LBBB) 09/24/2018   Noted on EKG  .  Lung nodule 11/2017   right and left lung  . Migraine headache    none recent  . Morbid obesity ([place]) 03/27/2006  . Neuropathy   . Pneumonia    walking pneu 15 yrs. ago  . Skin lesion 03/15/2011   In gluteal crease now s/p removal by Dr. [name] of General Surgery on 3/12. Path shows condyloma.     . Sleep apnea 04-10-11   uses cpap, pt does not know settings  . Sleep apnea   . Trigger finger    left third  .  Urge incontinence of urine     Past Surgical History:  Procedure Laterality Date  . ANAL FISTULECTOMY  04/17/2011   Procedure: FISTULECTOMY ANAL;  Surgeon: Imogene Burn. [name], MD;  Location: [facility];  Service: General;  Laterality: N/A;  Excision of Condyloma Gluteal Cleft   . BACK SURGERY  04-10-11   x5-Lumbar fusion-retained hardware.(Dr. Louanne Skye)  . BOTOX INJECTION N/A 12/29/2018   Procedure: BOTOX INJECTION(100 UNITS)  WITH CYSTOSCOPY;  Surgeon: Ceasar Mons, MD;  Location: [facility];  Service: Urology;  Laterality: N/A;  . BREAST CYST EXCISION     bilateral breast, 3 cysts removed from each breast  . BREAST EXCISIONAL BIOPSY Right    x 3  . BREAST EXCISIONAL BIOPSY Left    x 3  . CARPAL TUNNEL RELEASE Bilateral   . Cervical biospy    . COLONOSCOPY WITH PROPOFOL N/A 07/06/2015   Procedure: COLONOSCOPY WITH PROPOFOL;  Surgeon: [name], MD;  Location: [facility];  Service: Endoscopy;  Laterality: N/A;  . DOPPLER ECHOCARDIOGRAPHY  04/06/2012   AT [place] 55-60%  . FRACTURE SURGERY     little toe left foot  . I & D EXTREMITY Left 12/18/2016   Procedure: IRRIGATION AND DEBRIDEMENT LEFT FOOT, CLOSURE;  Surgeon: [name], MD;  Location: [place];  Service: Orthopedics;  Laterality: Left;  [name] Kitchen MULTIPLE TOOTH EXTRACTIONS    . NECK SURGERY  04-10-11   x3- cervical fusion with plating and screws-Dr. [name]  . TEE WITHOUT CARDIOVERSION N/A 04/06/2012   Procedure: TRANSESOPHAGEAL ECHOCARDIOGRAM (TEE);  Surgeon: [name], MD;  Location: [facility];  Service: Cardiovascular;  Laterality: N/A;  . TRIGGER FINGER RELEASE Right    middle finger  . TRIGGER FINGER RELEASE Left 06/28/2016   Procedure: RELEASE TRIGGER FINGER LEFT 3RD FINGER;  Surgeon: [name], MD;  Location: [place];  Service: Orthopedics;  Laterality: Left;  . TRIGGER FINGER RELEASE Right 06/12/2016   Procedure: RIGHT INDEX FINGER TRIGGER RELEASE;  Surgeon: [name], MD;  Location: [place];  Service:  Orthopedics;  Laterality: Right;  . TRIGGER FINGER RELEASE Right 01/19/2018   Procedure: RIGHT RING FINGER TRIGGER FINGER RELEASE;  Surgeon: [name], MD;  Location: [place];  Service: Orthopedics;  Laterality: Right;    Current Outpatient Medications  Medication Sig Dispense Refill  . ACCU-CHEK SOFTCLIX LANCETS lancets TEST 4 TIMES DAILY 200 each 6  . acetaminophen (TYLENOL) 500 MG tablet Take 1,000 mg by mouth 2 (two) times a day.     . albuterol (PROAIR HFA) 108 (90 Base) MCG/ACT inhaler INHALE 2 PUFFS INTO THE LUNGS EVERY 6 (SIX) HOURS AS NEEDED FOR SHORTNESS OF BREATH. 8 g 0  . ammonium lactate (LAC-HYDRIN) 12 % lotion APPLY 1 APPLICATION TOPICALLY 2 (TWO) TIMES DAILY AS NEEDED FOR DRY SKIN. 400 mL 3  . atorvastatin (LIPITOR) 40 MG tablet TAKE 1 TABLET BY MOUTH EVERY DAY 90 tablet 3  .  b complex vitamins capsule Take 1 capsule by mouth daily.    . baclofen (LIORESAL) 10 MG tablet Take 1 tablet (10 mg total) by mouth 3 (three) times daily as needed for muscle spasms. (Patient taking differently: Take 10 mg by mouth 3 (three) times daily. ) 90 each 0  . blood glucose meter kit and supplies KIT Dispense based on patient and insurance preference. Use up to four times daily as directed. (FOR ICD-9 250.00, 250.01). 1 each 0  . busPIRone (BUSPAR) 10 MG tablet TAKE 1 TABLET BY MOUTH THREE TIMES A DAY (Patient taking differently: Take 10 mg by mouth 3 (three) times daily. ) 270 tablet 4  . Calcium Citrate-Vitamin D (CALCIUM CITRATE + D3 PO) Take 1 tablet by mouth daily.    . Capsaicin-Menthol (PAIN RELIEF EX) Apply 1 application topically 4 (four) times daily as needed (shoulder pain).     . CHANTIX 0.5 MG tablet TAKE 1 TABLET BY MOUTH 2 TIMES DAILY. 60 tablet 0  . diclofenac Sodium (VOLTAREN) 1 % GEL Apply 2 g topically 4 (four) times daily. 500 g 1  . diltiazem (CARDIZEM CD) 120 MG 24 hr capsule Take 1 capsule (120 mg total) by mouth 2 (two) times daily. 180 capsule 3  . docusate sodium  (COLACE) 100 MG capsule Take 200 mg by mouth 2 (two) times daily.    . empagliflozin (JARDIANCE) 25 MG TABS tablet Take 25 mg by mouth daily.    . febuxostat (ULORIC) 40 MG tablet Take 40 mg by mouth at bedtime.     . Flaxseed, Linseed, (FLAXSEED OIL) 1000 MG CAPS Take 1,000 mg by mouth daily.    . fluticasone (FLONASE) 50 MCG/ACT nasal spray Place 2 sprays into both nostrils 2 (two) times daily. 16 g 6  . Fluticasone-Umeclidin-Vilant (TRELEGY ELLIPTA) 100-62.5-25 MCG/INH AEPB Inhale 1 puff into the lungs daily. 1 each 0  . folic acid (FOLVITE) 016 MCG tablet Take 400 mcg by mouth daily.    . furosemide (LASIX) 40 MG tablet Take 2 tablets (80 mg total) by mouth 2 (two) times daily. 180 tablet 3  . gabapentin (NEURONTIN) 300 MG capsule TAKE 2 CAPSULES (600 MG TOTAL) BY MOUTH 3 (THREE) TIMES DAILY. 540 capsule 2  . glucose blood (ACCU-CHEK AVIVA PLUS) test strip TEST 4 TIMES DAILY (Patient taking differently: 1 each by Other route 3 (three) times daily. TEST 3 TIMES DAILY) 100 each 12  . HYDROcodone-acetaminophen (NORCO) 7.5-325 MG tablet Take 1 tablet by mouth every 6 (six) hours as needed for moderate pain. 20 tablet 0  . hydrocortisone-pramoxine (PROCTOFOAM-HC) rectal foam Place 1 applicator rectally 2 (two) times daily. 10 g 0  . hydrOXYzine (ATARAX/VISTARIL) 10 MG tablet TAKE 1 TABLET BY MOUTH EVERY DAY AS NEEDED (Patient taking differently: Take 10 mg by mouth daily. ) 90 tablet 1  . Insulin Glargine (LANTUS SOLOSTAR) 100 UNIT/ML Solostar Pen Inject 22 Units into the skin daily before breakfast. (Patient taking differently: Inject 20 Units into the skin daily before breakfast. ) 15 mL   . insulin lispro (HUMALOG KWIKPEN) 100 UNIT/ML KwikPen Inject 0.08 mLs (8 Units total) into the skin 3 (three) times daily before meals.    . Insulin Pen Needle (B-D UF III MINI PEN NEEDLES) 31G X 5 MM MISC CHECK SUGARS 4 TIMES A DAY BEFORE MEALS AND AT BEDTIME. 100 each 12  . ketoconazole (NIZORAL) 2 % cream  APPLY 1 FINGERTIP AMOUNT TO EACH FOOT DAILY. (Patient taking differently: Apply 1 application topically  2 (two) times daily. ) 30 g 0  . loratadine (CLARITIN) 10 MG tablet Take 1 tablet (10 mg total) by mouth daily. 90 tablet 0  . losartan (COZAAR) 100 MG tablet Take 1 tablet (100 mg total) by mouth daily. 90 tablet 3  . metolazone (ZAROXOLYN) 2.5 MG tablet Take 1 tablet (2.5 mg total) by mouth 2 (two) times a week. TAKE 1 TABLET TWICE WEEKLY ON MONDAYS AND THURSDAYS ONLY 15 tablet 6  . montelukast (SINGULAIR) 10 MG tablet TAKE 1 TABLET BY MOUTH EVERYDAY AT BEDTIME (Patient taking differently: Take 10 mg by mouth at bedtime. ) 90 tablet 3  . Multiple Vitamins-Minerals (MULTIVITAMIN WITH MINERALS) tablet Take 1 tablet by mouth daily.    . mupirocin ointment (BACTROBAN) 2 % APPLY TO AFFECTED AREA TWICE A DAY (Patient taking differently: Apply 1 application topically 2 (two) times daily as needed (irritation). ) 22 g 0  . NARCAN 4 MG/0.1ML LIQD nasal spray kit Place 1 spray into the nose once.     . nicotine polacrilex ([employer] NICOTINE POLACRILEX) 4 MG gum Take 1 each (4 mg total) by mouth as needed for smoking cessation. 100 tablet 2  . NON FORMULARY Uses a C-PAP at bedtime    . nystatin (MYCOSTATIN) 100000 UNIT/ML suspension Take 5 mLs (500,000 Units total) by mouth 4 (four) times daily. Swish and swallow. (Patient taking differently: Take 5 mLs by mouth daily as needed (thrush). Swish and swallow.) 60 mL 3  . Omega-3 Fatty Acids (FISH OIL) 1000 MG CAPS Take 1,000 mg by mouth daily.     [name] Kitchen omeprazole (PRILOSEC) 20 MG capsule Take 1 capsule (20 mg total) by mouth daily. 90 capsule 3  . phenazopyridine (PYRIDIUM) 200 MG tablet Take 1 tablet (200 mg total) by mouth 3 (three) times daily as needed (for pain with urination). 30 tablet 0  . potassium chloride SA (KLOR-CON M20) 20 MEQ tablet Take 1 tablet (20 mEq total) by mouth 2 (two) times a week. Take 1 tablet twice weekly on Monday and Thursday. 15 tablet 6    . promethazine (PHENERGAN) 25 MG tablet Take 1 tablet (25 mg total) by mouth every 6 (six) hours as needed for nausea. 30 tablet 1  . Semaglutide,0.25 or 0.5MG/DOS, (OZEMPIC, 0.25 OR 0.5 MG/DOSE,) 2 MG/1.5ML SOPN Inject 0.25 mg into the skin once a week. Inject 0.25 mg into the skin once weekly on Thursdays. (minus 2 clicks)    . sertraline (ZOLOFT) 25 MG tablet Take 1 tablet (25 mg total) by mouth daily. 30 tablet 0  . sertraline (ZOLOFT) 50 MG tablet TAKE 1 TABLET BY MOUTH EVERY DAY (Patient taking differently: Take 50 mg by mouth daily. ) 90 tablet 1  . TOVIAZ 8 MG TB24 tablet Take 8 mg by mouth at bedtime.      No current facility-administered medications for this visit.   Facility-Administered Medications Ordered in Other Visits  Medication Dose Route Frequency Provider Last Rate Last Admin  . technetium tetrofosmin (TC-MYOVIEW) injection 09.6 millicurie  04.5 millicurie Intravenous Once PRN Sueanne Margarita, MD        Allergies as of 02/16/2019 - Review Complete 02/12/2019  Allergen Reaction Noted  . Aspirin Other (See Comments) 01/27/2018  . Chantix [varenicline tartrate] Other (See Comments) 09/01/2017  . Methadone hcl Other (See Comments) 08/19/2006  . Corticosteroids Other (See Comments) 01/16/2018  . Levofloxacin Itching 08/20/2007    Family History  Problem Relation Age of Onset  . Pulmonary embolism Mother   .  Stroke Father   . Alcohol abuse Father   . Pneumonia Father   . Cancer Maternal Aunt        breast  . Breast cancer Maternal Aunt   . Asthma Other   . COPD Other   . Hypertension Other   . Diabetes Other   . Asthma Sister   . Depression Sister   . Arthritis Sister   . Sickle cell anemia Son     Social History   Socioeconomic History  . Marital status: Single    Spouse name: Not on file  . Number of children: 1  . Years of education: 65  . Highest education level: 12th grade  Occupational History    Comment: Disabled  Tobacco Use  . Smoking  status: Current Every Day Smoker    Packs/day: 0.20    Years: 40.00    Pack years: 8.00    Types: Cigarettes    Start date: 01/28/1977    Last attempt to quit: 10/19/2018    Years since quitting: 0.3  . Smokeless tobacco: Never Used  . Tobacco comment: Quit 8 months 2019 - restart 05/2018   Hx 1.5 PPD  Substance and Sexual Activity  . Alcohol use: No    Alcohol/week: 0.0 standard drinks  . Drug use: Not Currently    Types: Marijuana, "Crack" cocaine    Comment: hx marijuana usemany years ago, Crack -none since 11/2015  . Sexual activity: Not Currently    Partners: Male    Birth control/protection: Post-menopausal  Other Topics Concern  . Not on file  Social History Narrative   Current Social History       Who lives at home: Patient lives alone in one level home; has 4 steps onto porch with a handrail. Has smoke alarms, no throw rugs. Has elevated toilet. Has shower bench in tub with grab rails.    Transportation: Patient has own vehicle and drives herself    Important Relationships "Family and friends"    Pets: None    Likes to eat varied diet. Seafood, fish, roasted [place] breast, vegetables and salads and fruits.   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 I'm a lovable person."                                                                                                   Social Determinants of Health   Financial Resource Strain:   . Difficulty of Paying Living Expenses: Not on file  Food Insecurity:   . Worried About Charity fundraiser in the Last Year: Not on file  . Ran Out of Food in the Last Year: Not on file  Transportation Needs:   . Lack of Transportation (Medical): Not on file  .  Lack of Transportation  (Non-Medical): Not on file  Physical Activity:   . Days of Exercise per Week: Not on file  . Minutes of Exercise per Session: Not on file  Stress:   . Feeling of Stress : Not on file  Social Connections:   . Frequency of Communication with Friends and Family: Not on file  . Frequency of Social Gatherings with Friends and Family: Not on file  . Attends Religious Services: Not on file  . Active Member of Clubs or Organizations: Not on file  . Attends Archivist Meetings: Not on file  . Marital Status: Not on file  Intimate Partner Violence:   . Fear of Current or Ex-Partner: Not on file  . Emotionally Abused: Not on file  . Physically Abused: Not on file  . Sexually Abused: Not on file    Review of Systems: 12 system ROS is negative except as noted above with the additions of allergies, anxiety, arthritis, depression, shortness of breath, and excessive thirst. .   Physical Exam: General:   Alert,  well-nourished, pleasant and cooperative in NAD Head:  Normocephalic and atraumatic. Eyes:  Sclera clear, no icterus.   Conjunctiva pink. Ears:  Normal auditory acuity. Nose:  No deformity, discharge,  or lesions. Abdomen:  Morbid obesity. Soft, nontender, nondistended, normal bowel sounds, no rebound or guarding. No hepatosplenomegaly.   Rectal:  Patient declined. She feels her rectum regularly and says there is nothing for me to see. The problem is internal.  Msk:  Symmetrical. No obvious boney deformities LAD: No inguinal or umbilical LAD Extremities:  No clubbing or edema. Neurologic:  Alert and  oriented x4;  grossly nonfocal Skin: No rash or bruise. Psych:  Alert and cooperative. Normal mood and affect.     Renuka Farfan L. Tarri Glenn, MD, MPH 02/16/2019, 2:15 PM

## 2019-02-16 NOTE — Patient Instructions (Signed)
Try a daily fiber supplement such as Metamucil.   We will call you to schedule your colonoscopy once covid restrictions have been lifted.

## 2019-02-17 ENCOUNTER — Encounter: Payer: Self-pay | Admitting: Family Medicine

## 2019-02-18 ENCOUNTER — Telehealth: Payer: Self-pay | Admitting: Gastroenterology

## 2019-02-18 DIAGNOSIS — G4733 Obstructive sleep apnea (adult) (pediatric): Secondary | ICD-10-CM | POA: Diagnosis not present

## 2019-02-18 DIAGNOSIS — J449 Chronic obstructive pulmonary disease, unspecified: Secondary | ICD-10-CM | POA: Diagnosis not present

## 2019-02-18 NOTE — Telephone Encounter (Signed)
Pt reported that "the lump on her butt has grown and spread--it is moving up towards my vagina."

## 2019-02-18 NOTE — Telephone Encounter (Signed)
"  This thing inside my butt spread a whole lot. It's spread towards my vagina and is closing my butt hole." She states when having a BM, the liquid comes through but not to solid stool. She is endorses pain when she sits, currently lying on her side. She states she did nothing to make it bigger (blowing nose or sneezing), she states "I woke up and it was bigger."   She states she is on Slim Fast for weight loss which causes her to have diarrhea. This is the only reason she states she is able to stool despite what she seems to describe as an obstruction. Please advise.

## 2019-02-18 NOTE — Telephone Encounter (Signed)
She would not allow me to perform a rectal examination at the time of her consultation. She wanted to wait until the time of colonoscopy. Waiting for colonoscopy given COVID restrictions at the hospital. It is hard for me to make recommendations without having a better sense of what is going on. Please offer her a follow-up appointment with an APP to review (I am on hospital rotation next week and ideally she could be seen prior to my return to the clinic). Thank you.

## 2019-02-18 NOTE — Telephone Encounter (Signed)
Pt scheduled to see Amy Esterwood PA 02/23/19@1 :30pm. Pt aware of appt.

## 2019-02-22 ENCOUNTER — Other Ambulatory Visit: Payer: Self-pay | Admitting: Family Medicine

## 2019-02-22 ENCOUNTER — Encounter: Payer: Self-pay | Admitting: Family Medicine

## 2019-02-22 DIAGNOSIS — R911 Solitary pulmonary nodule: Secondary | ICD-10-CM | POA: Diagnosis not present

## 2019-02-22 DIAGNOSIS — R252 Cramp and spasm: Secondary | ICD-10-CM | POA: Diagnosis not present

## 2019-02-22 DIAGNOSIS — G4733 Obstructive sleep apnea (adult) (pediatric): Secondary | ICD-10-CM | POA: Diagnosis not present

## 2019-02-22 DIAGNOSIS — I50813 Acute on chronic right heart failure: Secondary | ICD-10-CM | POA: Diagnosis not present

## 2019-02-22 DIAGNOSIS — J449 Chronic obstructive pulmonary disease, unspecified: Secondary | ICD-10-CM | POA: Diagnosis not present

## 2019-02-22 NOTE — Telephone Encounter (Signed)
Spoke with patient regarding her gabapentin.  She reports that she forgot she was prescribed by another physician and will reach out to them for the refill.

## 2019-02-23 ENCOUNTER — Ambulatory Visit: Payer: Medicare Other | Admitting: Physician Assistant

## 2019-02-24 ENCOUNTER — Ambulatory Visit: Payer: Medicare Other | Admitting: Nurse Practitioner

## 2019-02-24 ENCOUNTER — Ambulatory Visit: Payer: Medicare Other | Admitting: Pulmonary Disease

## 2019-02-24 NOTE — Progress Notes (Deleted)
Synopsis: Referred in November 2019 for COPD by [name], MD  Subjective:   PATIENT ID: [name] GENDER: female DOB: Jan 20, 1966, MRN: [id number]  No chief complaint on file.   PMH of OSA, OHS, on BiPAP, Chronic respiratory failure on 2L day and night. Long time smoker past 40 years 1.5ppd. Recent CT scan with RUL and LUL lung nodule. She has trouble walking and talking at the same time. Currently just managed with albuterol inhaler. Also on trelegy inhaler. Planning to go on the 2nd for evaluation of lap-band surgery. Its a 6 month program that she is enrolling in. She does have allergies to seasons, outdoor and pollen.  Overall the patient has had significant shortness of breath with exertion or minimal activity.  She does understand that her weight plays a large role in this.  She has been trying to watch what she is eating.  She has been successful at quitting smoking within the past year.  She has a sleep study scheduled for evaluation of her OSA and getting a new machine and new mask.  She has daily cough, denies sputum production, denies recent exacerbation.  She has been told that she has COPD but has not had full PFTs before.  She does have chronic diastolic heart failure that is managed with diuretics.  She watches her fluid intake.  She has noticed some edema in her lower extremities.  OV 02/13/2018: She has been doing really well. She started a new fluid pill and feels like it has been better results. She has lost several pounds proximately 20 since she was last seen with [place].  She also obtained a new BiPAP machine for treatment of her obstructive sleep apnea.  She really likes the new machine and has been sleeping so much better.  She has been using a new humidifier which has made it tolerable.  She has been checking her oxygen on a regular basis.  Most of the time with her new finger O2 sat probe she is in the mid 90s.  And has not needed oxygen at rest and even with exertion  in most places.  She does keep it with her just in case.  She is still using oxygen at nighttime with her Pap therapy.  At this point her respiratory complaints are approximately the same.  She does have a follow-up CT scan for March 2020 for bilateral lung nodules and PFTs that have been ordered but not yet completed.  Office spirometry last time revealed reduced spirometry no evidence of obstruction.  OV 08/18/2018: Seen to day in follow up and having PFTS completed today in the offices. She recently describes and episode of getting a steroid injection her had for trigger finger. She was stressed out because having her glucose elevated. She unfortunately started smoking again. She has started her chantix again. She is going to stop again. She stopped for 8 months before restarted.  Pulmonary function test completed today which revealed an FVC of 2.29 L, 65% predicted, FEV1, 1.58 L, 57% predicted, ERV 46%, DLCO 49% predicted.  Patient has a BMI of 55.67.  She states that she cannot go out during the heat of the day.  She tries to avoid leaving her home between noon and 6 PM.  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 ***  Past Medical History:  Diagnosis Date  .  Alcoholism ([place])   . Anxiety   . Arthritis 04-10-11   hips, shoulders, back  . Asthma   . Bipolar disorder ([place])   . Cardiomegaly   . Cervical cancer ([place]) 1993   cervical, no treatment done, went away per pt  . Cervical dysplasia or atypia 04-10-11   '93- once dx.-got pregnant-no intervention, then postpartum, no dysplasia found  . CHF (congestive heart failure) ([place])    no cardiologist 2014 dx, none now  . Chronic hypoxemic respiratory failure ([place])   . Chronic kidney disease    stage 3 kidney disease  . 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 Pathology showed  Condyloma, gluteal CONDYLOMA ACUMINATUM.   [name] Kitchen COPD (chronic obstructive pulmonary disease) ([place])   . Depression   . Diabetes mellitus without complication ([place])   . Dyspnea    with actity, sitting  . Fall 12/16/2016  . Fibrocystic breast disease   . GERD (gastroesophageal reflux disease)   . Grade I diastolic dysfunction 16/10/9602   Noted on ECHO  . Hepatitis    hep B-count is low at present,doesn't register  . History of PSVT (paroxysmal supraventricular tachycardia)   . Hypercalcemia   . Hyperlipidemia   . Hypertension   . Incomplete left bundle branch block (LBBB) 09/24/2018   Noted on EKG  . Lung nodule 11/2017   right and left lung  . Migraine headache    none recent  . Morbid obesity ([place]) 03/27/2006  . Neuropathy   . Pneumonia    walking pneu 15 yrs. ago  . Skin lesion 03/15/2011   In gluteal crease now s/p removal by Dr. [name] of General Surgery on 3/12. Path shows condyloma.     . Sleep apnea 04-10-11   uses cpap, pt does not know settings  . Trigger finger    left third  . Urge incontinence of urine      Family History  Problem Relation Age of Onset  . Pulmonary embolism Mother   . Stroke Father   . Alcohol abuse Father   . Pneumonia Father   . Hypertension Father   . Breast cancer Maternal Aunt   . Hypertension Other   . Diabetes Other        Aunts and cousins  . Asthma Sister   . Depression Sister   . Arthritis Sister   . Sickle cell anemia Son      Past Surgical History:  Procedure Laterality Date  . ANAL FISTULECTOMY  04/17/2011   Procedure: FISTULECTOMY ANAL;  Surgeon: Imogene Burn. [name], MD;  Location: [facility];  Service: General;  Laterality: N/A;  Excision of Condyloma Gluteal Cleft   . BOTOX INJECTION N/A 12/29/2018   Procedure: BOTOX INJECTION(100 UNITS)  WITH CYSTOSCOPY;  Surgeon: Ceasar Mons, MD;  Location: [facility];  Service: Urology;  Laterality: N/A;  . BREAST CYST EXCISION     bilateral breast, 3 cysts removed from each breast    . BREAST EXCISIONAL BIOPSY Right    x 3  . BREAST EXCISIONAL BIOPSY Left    x 3  . CARPAL TUNNEL RELEASE Bilateral   . Cervical biospy    . CERVICAL FUSION  04-10-11   x3- cervical fusion with plating and screws-Dr. [name]  . COLONOSCOPY WITH PROPOFOL N/A 07/06/2015   Procedure: COLONOSCOPY WITH PROPOFOL;  Surgeon: [name], MD;  Location: [facility];  Service: Endoscopy;  Laterality: N/A;  . DOPPLER ECHOCARDIOGRAPHY  04/06/2012   AT [place]  55-60%  . FRACTURE SURGERY     little toe left foot  . I & D EXTREMITY Left 12/18/2016   Procedure: IRRIGATION AND DEBRIDEMENT LEFT FOOT, CLOSURE;  Surgeon: [name], MD;  Location: [facility];  Service: Orthopedics;  Laterality: Left;  . [place] SURGERY  04-10-11   x5-Lumbar fusion-retained hardware.(Dr. Louanne Skye)  . MULTIPLE TOOTH EXTRACTIONS    . TEE WITHOUT CARDIOVERSION N/A 04/06/2012   Procedure: TRANSESOPHAGEAL ECHOCARDIOGRAM (TEE);  Surgeon: [name], MD;  Location: [facility] ENDOSCOPY;  Service: Cardiovascular;  Laterality: N/A;  . TRIGGER FINGER RELEASE Right    middle finger  . TRIGGER FINGER RELEASE Left 06/28/2016   Procedure: RELEASE TRIGGER FINGER LEFT 3RD FINGER;  Surgeon: [name], MD;  Location: [facility];  Service: Orthopedics;  Laterality: Left;  . TRIGGER FINGER RELEASE Right 06/12/2016   Procedure: RIGHT INDEX FINGER TRIGGER RELEASE;  Surgeon: [name], MD;  Location: [facility];  Service: Orthopedics;  Laterality: Right;  . TRIGGER FINGER RELEASE Right 01/19/2018   Procedure: RIGHT RING FINGER TRIGGER FINGER RELEASE;  Surgeon: [name], MD;  Location: [facility];  Service: Orthopedics;  Laterality: Right;    Social History   Socioeconomic History  . Marital status: Single    Spouse name: Not on file  . Number of children: 1  . Years of education: 19  . Highest education level: 12th grade  Occupational History  . Occupation: disabled    Comment: Disabled  Tobacco Use  . Smoking status: Current Every Day  Smoker    Packs/day: 0.20    Years: 40.00    Pack years: 8.00    Types: Cigarettes    Start date: 01/28/1977  . Smokeless tobacco: Never Used  . Tobacco comment: Quit 8 months 2019 - restart 05/2018   Hx 1.5 PPD  Substance and Sexual Activity  . Alcohol use: No    Alcohol/week: 0.0 standard drinks  . Drug use: Not Currently    Types: Marijuana, "Crack" cocaine    Comment: hx marijuana usemany years ago, Crack -none since 11/2015  . Sexual activity: Not Currently    Partners: Male    Birth control/protection: Post-menopausal  Other Topics Concern  . Not on file  Social History Narrative   Current Social History       Who lives at home: Patient lives alone in one level home; has 4 steps onto porch with a handrail. Has smoke alarms, no throw rugs. Has elevated toilet. Has shower bench in tub with grab rails.    Transportation: Patient has own vehicle and drives herself    Important Relationships "Family and friends"    Pets: None    Likes to eat varied diet. Seafood, fish, roasted [place] breast, vegetables and salads and fruits.   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 I'm a lovable person."  Social Determinants of Health   Financial Resource Strain:   . Difficulty of Paying Living Expenses: Not on file  Food Insecurity:   . Worried About Charity fundraiser in the Last Year: Not on file  . Ran Out of Food in the Last Year: Not on file  Transportation Needs:   . Lack of Transportation (Medical): Not on file  . Lack of Transportation (Non-Medical): Not on file  Physical Activity:   . Days of Exercise per Week: Not on file  .  Minutes of Exercise per Session: Not on file  Stress:   . Feeling of Stress : Not on file  Social Connections:   . Frequency of Communication with Friends and Family: Not on file  . Frequency of Social Gatherings with Friends and Family: Not on file  . Attends Religious Services: Not on file  . Active Member of Clubs or Organizations: Not on file  . Attends Archivist Meetings: Not on file  . Marital Status: Not on file  Intimate Partner Violence:   . Fear of Current or Ex-Partner: Not on file  . Emotionally Abused: Not on file  . Physically Abused: Not on file  . Sexually Abused: Not on file     Allergies  Allergen Reactions  . Aspirin Other (See Comments)    Kidney disease  . Chantix [Varenicline Tartrate] Other (See Comments)    On the 1 mg dose, patient experienced hallucintation  . 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 Corticosteroids Other (See Comments)    Hyperglycemia   . Levofloxacin Itching     Outpatient Medications Prior to Visit  Medication Sig Dispense Refill  . ACCU-CHEK SOFTCLIX LANCETS lancets TEST 4 TIMES DAILY 200 each 6  . acetaminophen (TYLENOL) 500 MG tablet Take 1,000 mg by mouth 2 (two) times a day.     . albuterol (PROAIR HFA) 108 (90 Base) MCG/ACT inhaler INHALE 2 PUFFS INTO THE LUNGS EVERY 6 (SIX) HOURS AS NEEDED FOR SHORTNESS OF BREATH. 8 g 0  . ammonium lactate (LAC-HYDRIN) 12 % lotion APPLY 1 APPLICATION TOPICALLY 2 (TWO) TIMES DAILY AS NEEDED FOR DRY SKIN. 400 mL 3  . atorvastatin (LIPITOR) 40 MG tablet TAKE 1 TABLET BY MOUTH EVERY DAY 90 tablet 3  . b complex vitamins capsule Take 1 capsule by mouth daily.    . baclofen (LIORESAL) 10 MG tablet Take 1 tablet (10 mg total) by mouth 3 (three) times daily as needed for muscle spasms. (Patient taking differently: Take 10 mg by mouth 3 (three) times daily. ) 90 each 0  . blood glucose meter kit and supplies KIT Dispense based on patient and insurance preference.  Use up to four times daily as directed. (FOR ICD-9 250.00, 250.01). 1 each 0  . busPIRone (BUSPAR) 10 MG tablet TAKE 1 TABLET BY MOUTH THREE TIMES A DAY (Patient taking differently: Take 10 mg by mouth 3 (three) times daily. ) 270 tablet 4  . Calcium Citrate-Vitamin D (CALCIUM CITRATE + D3 PO) Take 1 tablet by mouth daily.    . Capsaicin-Menthol (PAIN RELIEF EX) Apply 1 application topically 4 (four) times daily as needed (shoulder pain).     . CHANTIX 0.5 MG tablet TAKE 1 TABLET BY MOUTH 2 TIMES DAILY. 60 tablet 0  . diclofenac Sodium (VOLTAREN) 1 % GEL Apply 2 g topically 4 (four) times daily. 500 g 1  . diltiazem (CARDIZEM CD) 120 MG 24 hr capsule  Take 1 capsule (120 mg total) by mouth 2 (two) times daily. 180 capsule 3  . docusate sodium (COLACE) 100 MG capsule Take 200 mg by mouth 2 (two) times daily.    . empagliflozin (JARDIANCE) 25 MG TABS tablet Take 25 mg by mouth daily.    . febuxostat (ULORIC) 40 MG tablet Take 40 mg by mouth at bedtime.     . Flaxseed, Linseed, (FLAXSEED OIL) 1000 MG CAPS Take 1,000 mg by mouth daily.    . fluticasone (FLONASE) 50 MCG/ACT nasal spray Place 2 sprays into both nostrils 2 (two) times daily. 16 g 6  . Fluticasone-Umeclidin-Vilant (TRELEGY ELLIPTA) 100-62.5-25 MCG/INH AEPB Inhale 1 puff into the lungs daily. 1 each 0  . folic acid (FOLVITE) 784 MCG tablet Take 400 mcg by mouth daily.    . furosemide (LASIX) 40 MG tablet Take 2 tablets (80 mg total) by mouth 2 (two) times daily. 180 tablet 3  . gabapentin (NEURONTIN) 300 MG capsule TAKE 2 CAPSULES (600 MG TOTAL) BY MOUTH 3 (THREE) TIMES DAILY. 540 capsule 2  . glucose blood (ACCU-CHEK AVIVA PLUS) test strip TEST 4 TIMES DAILY (Patient taking differently: 1 each by Other route 3 (three) times daily. TEST 3 TIMES DAILY) 100 each 12  . HYDROcodone-acetaminophen (NORCO) 7.5-325 MG tablet Take 1 tablet by mouth every 6 (six) hours as needed for moderate pain. 20 tablet 0  . hydrOXYzine (ATARAX/VISTARIL) 10 MG  tablet TAKE 1 TABLET BY MOUTH EVERY DAY AS NEEDED (Patient taking differently: Take 10 mg by mouth daily. ) 90 tablet 1  . Insulin Glargine (LANTUS SOLOSTAR) 100 UNIT/ML Solostar Pen Inject 22 Units into the skin daily before breakfast. (Patient taking differently: Inject 20 Units into the skin daily before breakfast. ) 15 mL   . insulin lispro (HUMALOG KWIKPEN) 100 UNIT/ML KwikPen Inject 0.08 mLs (8 Units total) into the skin 3 (three) times daily before meals.    . Insulin Pen Needle (B-D UF III MINI PEN NEEDLES) 31G X 5 MM MISC CHECK SUGARS 4 TIMES A DAY BEFORE MEALS AND AT BEDTIME. 100 each 12  . ketoconazole (NIZORAL) 2 % cream APPLY 1 FINGERTIP AMOUNT TO EACH FOOT DAILY. (Patient taking differently: Apply 1 application topically 2 (two) times daily. ) 30 g 0  . loratadine (CLARITIN) 10 MG tablet Take 1 tablet (10 mg total) by mouth daily. 90 tablet 0  . losartan (COZAAR) 100 MG tablet Take 1 tablet (100 mg total) by mouth daily. 90 tablet 3  . metolazone (ZAROXOLYN) 2.5 MG tablet Take 1 tablet (2.5 mg total) by mouth 2 (two) times a week. TAKE 1 TABLET TWICE WEEKLY ON MONDAYS AND THURSDAYS ONLY 15 tablet 6  . montelukast (SINGULAIR) 10 MG tablet TAKE 1 TABLET BY MOUTH EVERYDAY AT BEDTIME (Patient taking differently: Take 10 mg by mouth at bedtime. ) 90 tablet 3  . Multiple Vitamins-Minerals (MULTIVITAMIN WITH MINERALS) tablet Take 1 tablet by mouth daily.    . mupirocin ointment (BACTROBAN) 2 % APPLY TO AFFECTED AREA TWICE A DAY (Patient taking differently: Apply 1 application topically 2 (two) times daily as needed (irritation). ) 22 g 0  . NARCAN 4 MG/0.1ML LIQD nasal spray kit Place 1 spray into the nose once.     . nicotine polacrilex ([employer] NICOTINE POLACRILEX) 4 MG gum Take 1 each (4 mg total) by mouth as needed for smoking cessation. 100 tablet 2  . NON FORMULARY Uses a C-PAP at bedtime    . nystatin (MYCOSTATIN) 100000 UNIT/ML  suspension Take 5 mLs (500,000 Units total) by mouth 4 (four)  times daily. Swish and swallow. (Patient taking differently: Take 5 mLs by mouth daily as needed (thrush). Swish and swallow.) 60 mL 3  . Omega-3 Fatty Acids (FISH OIL) 1000 MG CAPS Take 1,000 mg by mouth daily.     [name] Kitchen omeprazole (PRILOSEC) 20 MG capsule Take 1 capsule (20 mg total) by mouth daily. 90 capsule 3  . OXYGEN Inhale into the lungs.    . potassium chloride SA (KLOR-CON M20) 20 MEQ tablet Take 1 tablet (20 mEq total) by mouth 2 (two) times a week. Take 1 tablet twice weekly on Monday and Thursday. 15 tablet 6  . promethazine (PHENERGAN) 25 MG tablet Take 1 tablet (25 mg total) by mouth every 6 (six) hours as needed for nausea. 30 tablet 1  . Semaglutide,0.25 or 0.5MG/DOS, (OZEMPIC, 0.25 OR 0.5 MG/DOSE,) 2 MG/1.5ML SOPN Inject 0.25 mg into the skin once a week. Inject 0.25 mg into the skin once weekly on Thursdays. (minus 2 clicks)    . sertraline (ZOLOFT) 25 MG tablet Take 1 tablet (25 mg total) by mouth daily. 30 tablet 0  . sertraline (ZOLOFT) 50 MG tablet TAKE 1 TABLET BY MOUTH EVERY DAY (Patient taking differently: Take 50 mg by mouth daily. ) 90 tablet 1  . TOVIAZ 8 MG TB24 tablet Take 8 mg by mouth at bedtime.      Facility-Administered Medications Prior to Visit  Medication Dose Route Frequency Provider Last Rate Last Admin  . technetium tetrofosmin (TC-MYOVIEW) injection 42.5 millicurie  95.6 millicurie Intravenous Once PRN Turner, Eber Hong, MD        ROS  Objective:  Physical Exam   There were no vitals filed for this visit.   on RA BMI Readings from Last 3 Encounters:  02/16/19 56.01 kg/m  02/12/19 52.43 kg/m  01/14/19 53.06 kg/m   Wt Readings from Last 3 Encounters:  02/16/19 (!) 363 lb (164.7 kg)  02/12/19 (!) 365 lb 6.4 oz (165.7 kg)  01/14/19 (!) 369 lb 12.8 oz (167.7 kg)     CBC    Component Value Date/Time   WBC 9.9 12/21/2018 1023   RBC 6.23 (H) 12/21/2018 1023   HGB 16.7 (H) 12/21/2018 1023   HGB 15.9 04/23/2017 1010   HCT 50.6 (H) 12/21/2018  1023   HCT 46.2 04/23/2017 1010   PLT 323 12/21/2018 1023   PLT 360 04/23/2017 1010   MCV 81.2 12/21/2018 1023   MCV 82 04/23/2017 1010   MCH 26.8 12/21/2018 1023   MCHC 33.0 12/21/2018 1023   RDW 15.4 12/21/2018 1023   RDW 13.9 04/23/2017 1010   LYMPHSABS 3.1 09/24/2018 1821   MONOABS 1.0 09/24/2018 1821   EOSABS 0.3 09/24/2018 1821   BASOSABS 0.1 09/24/2018 1821    Chest Imaging: September 2019 CT chest: Right upper lobe 6 mm lung nodule, irregular, 4 mm irregular dense nodule left upper lobe The patient's images have been independently reviewed by me.    CT chest 08/07/2018: Reviewed images today in the office again with the patient regarding her CT chest in July 2020.  Patient has a 12 mm subsolid nodule within the right upper lobe with slight increase from previous imaging.  Recommending follow-up to document stability. The patient's images have been independently reviewed by me.     Pulmonary Functions Testing Results: PFT Results Latest Ref Rng & Units 08/18/2018  FVC-Pre L 2.32  FVC-Predicted Pre % 66  FVC-Post L 2.29  FVC-Predicted Post % 65  Pre FEV1/FVC % % 68  Post FEV1/FCV % % 69  FEV1-Pre L 1.57  FEV1-Predicted Pre % 56  FEV1-Post L 1.58  DLCO UNC% % 49  DLCO COR %Predicted % 88  TLC L 3.41  TLC % Predicted % 57  RV % Predicted % 51    FeNO: None   Pathology: None  Echocardiogram:  Study Conclusions - Left ventricle: The cavity size was normal. Wall thickness was   normal. Systolic function was normal. The estimated ejection   fraction was in the range of 55% to 60%. Wall motion was normal;   there were no regional wall motion abnormalities. Doppler   parameters are consistent with abnormal left ventricular   relaxation (grade 1 diastolic dysfunction).  Heart Catheterization: None      Assessment & Plan:      ICD-10-CM   1. Stage 2 moderate COPD by GOLD classification ([place])  J44.9   2. Mixed restrictive and obstructive lung disease ([place])   J43.9    J98.4   3. Morbid obesity due to excess calories (H[facility])  E66.01   4. Nodule of upper lobe of right lung  R91.1   5. Tobacco abuse  Z72.0   6. Cigarette smoker  F17.210   7. OSA treated with BiPAP  G47.33   8. Restrictive lung disease secondary to obesity  J98.4    E66.9    Discussion:  This is a 54 year old morbidly obese, has OSA on BiPAP, likely has OHS. She has moderate COPD as diagnosed by PFTs completed today.  She likely has mixed restrictive and obstructive defect due to her morbid obesity.  Unfortunately she is still smoking.  Today in the office we discussed smoking cessation counseling again.  She is going to restart her Chantix.  She really wants to try to quit again.  She did well for 8 months before going back after a stressful event related to managing her diabetes and getting a steroid injection.  At this point she needs to continue use of her Trelegy inhaler. Continue use of albuterol for shortness of breath and wheezing. She needs to restart her Chantix. She can also continue tapering her cigarette use and hopefully able to quit soon. As for the lung nodule in the right upper lobe she will need a repeat CT image in 6 months.  If it is continuing to grow we need to discuss bronchoscopy versus interventional radiology for needle biopsy.  It is within the upper lobe and adjacent to the pleura puts him at moderate risk for development of pneumothorax and may be better reached by IR.  However we will discussed risk benefits and alternatives of proceeding with a bronchoscopy versus referral.  Patient to return to clinic in 6 months following CT images to be completed at that time.  We will get her an appointment to establish care with sleep physician here in the clinic.  Assessment:   . 1 or more chronic illnesses with severe exacerbation, progression, or side effects of treatment;  . 1 acute or chronic illness or injury that poses a threat to life or bodily  function  Plan Following Extensive Data Review & Interpretation:  . I reviewed prior external note(s) from 02/16/2019 gastroenterology, [place], MD.  Recommending colonoscopy.  02/12/2019 PCP Dr. Caron Presume.  . I reviewed the result(s) of 12/25/2018: Covid negative, multiple baseline hemoglobins elevated, August 2020 300 absolute eosinophil count . I have ordered repeat chest CT imaging has been ordered  from last office visit and scheduled for February 2021.  Independent interpretation of tests . Review of patient's *** images revealed ***. The patient's images have been independently reviewed by me.    ***    Current Outpatient Medications:  .  ACCU-CHEK SOFTCLIX LANCETS lancets, TEST 4 TIMES DAILY, Disp: 200 each, Rfl: 6 .  acetaminophen (TYLENOL) 500 MG tablet, Take 1,000 mg by mouth 2 (two) times a day. , Disp: , Rfl:  .  albuterol (PROAIR HFA) 108 (90 Base) MCG/ACT inhaler, INHALE 2 PUFFS INTO THE LUNGS EVERY 6 (SIX) HOURS AS NEEDED FOR SHORTNESS OF BREATH., Disp: 8 g, Rfl: 0 .  ammonium lactate (LAC-HYDRIN) 12 % lotion, APPLY 1 APPLICATION TOPICALLY 2 (TWO) TIMES DAILY AS NEEDED FOR DRY SKIN., Disp: 400 mL, Rfl: 3 .  atorvastatin (LIPITOR) 40 MG tablet, TAKE 1 TABLET BY MOUTH EVERY DAY, Disp: 90 tablet, Rfl: 3 .  b complex vitamins capsule, Take 1 capsule by mouth daily., Disp: , Rfl:  .  baclofen (LIORESAL) 10 MG tablet, Take 1 tablet (10 mg total) by mouth 3 (three) times daily as needed for muscle spasms. (Patient taking differently: Take 10 mg by mouth 3 (three) times daily. ), Disp: 90 each, Rfl: 0 .  blood glucose meter kit and supplies KIT, Dispense based on patient and insurance preference. Use up to four times daily as directed. (FOR ICD-9 250.00, 250.01)., Disp: 1 each, Rfl: 0 .  busPIRone (BUSPAR) 10 MG tablet, TAKE 1 TABLET BY MOUTH THREE TIMES A DAY (Patient taking differently: Take 10 mg by mouth 3 (three) times daily. ), Disp: 270 tablet, Rfl: 4 .  Calcium  Citrate-Vitamin D (CALCIUM CITRATE + D3 PO), Take 1 tablet by mouth daily., Disp: , Rfl:  .  Capsaicin-Menthol (PAIN RELIEF EX), Apply 1 application topically 4 (four) times daily as needed (shoulder pain). , Disp: , Rfl:  .  CHANTIX 0.5 MG tablet, TAKE 1 TABLET BY MOUTH 2 TIMES DAILY., Disp: 60 tablet, Rfl: 0 .  diclofenac Sodium (VOLTAREN) 1 % GEL, Apply 2 g topically 4 (four) times daily., Disp: 500 g, Rfl: 1 .  diltiazem (CARDIZEM CD) 120 MG 24 hr capsule, Take 1 capsule (120 mg total) by mouth 2 (two) times daily., Disp: 180 capsule, Rfl: 3 .  docusate sodium (COLACE) 100 MG capsule, Take 200 mg by mouth 2 (two) times daily., Disp: , Rfl:  .  empagliflozin (JARDIANCE) 25 MG TABS tablet, Take 25 mg by mouth daily., Disp: , Rfl:  .  febuxostat (ULORIC) 40 MG tablet, Take 40 mg by mouth at bedtime. , Disp: , Rfl:  .  Flaxseed, Linseed, (FLAXSEED OIL) 1000 MG CAPS, Take 1,000 mg by mouth daily., Disp: , Rfl:  .  fluticasone (FLONASE) 50 MCG/ACT nasal spray, Place 2 sprays into both nostrils 2 (two) times daily., Disp: 16 g, Rfl: 6 .  Fluticasone-Umeclidin-Vilant (TRELEGY ELLIPTA) 100-62.5-25 MCG/INH AEPB, Inhale 1 puff into the lungs daily., Disp: 1 each, Rfl: 0 .  folic acid (FOLVITE) 456 MCG tablet, Take 400 mcg by mouth daily., Disp: , Rfl:  .  furosemide (LASIX) 40 MG tablet, Take 2 tablets (80 mg total) by mouth 2 (two) times daily., Disp: 180 tablet, Rfl: 3 .  gabapentin (NEURONTIN) 300 MG capsule, TAKE 2 CAPSULES (600 MG TOTAL) BY MOUTH 3 (THREE) TIMES DAILY., Disp: 540 capsule, Rfl: 2 .  glucose blood (ACCU-CHEK AVIVA PLUS) test strip, TEST 4 TIMES DAILY (Patient taking differently: 1 each by Other route 3 (three)  times daily. TEST 3 TIMES DAILY), Disp: 100 each, Rfl: 12 .  HYDROcodone-acetaminophen (NORCO) 7.5-325 MG tablet, Take 1 tablet by mouth every 6 (six) hours as needed for moderate pain., Disp: 20 tablet, Rfl: 0 .  hydrOXYzine (ATARAX/VISTARIL) 10 MG tablet, TAKE 1 TABLET BY MOUTH  EVERY DAY AS NEEDED (Patient taking differently: Take 10 mg by mouth daily. ), Disp: 90 tablet, Rfl: 1 .  Insulin Glargine (LANTUS SOLOSTAR) 100 UNIT/ML Solostar Pen, Inject 22 Units into the skin daily before breakfast. (Patient taking differently: Inject 20 Units into the skin daily before breakfast. ), Disp: 15 mL, Rfl:  .  insulin lispro (HUMALOG KWIKPEN) 100 UNIT/ML KwikPen, Inject 0.08 mLs (8 Units total) into the skin 3 (three) times daily before meals., Disp: , Rfl:  .  Insulin Pen Needle (B-D UF III MINI PEN NEEDLES) 31G X 5 MM MISC, CHECK SUGARS 4 TIMES A DAY BEFORE MEALS AND AT BEDTIME., Disp: 100 each, Rfl: 12 .  ketoconazole (NIZORAL) 2 % cream, APPLY 1 FINGERTIP AMOUNT TO EACH FOOT DAILY. (Patient taking differently: Apply 1 application topically 2 (two) times daily. ), Disp: 30 g, Rfl: 0 .  loratadine (CLARITIN) 10 MG tablet, Take 1 tablet (10 mg total) by mouth daily., Disp: 90 tablet, Rfl: 0 .  losartan (COZAAR) 100 MG tablet, Take 1 tablet (100 mg total) by mouth daily., Disp: 90 tablet, Rfl: 3 .  metolazone (ZAROXOLYN) 2.5 MG tablet, Take 1 tablet (2.5 mg total) by mouth 2 (two) times a week. TAKE 1 TABLET TWICE WEEKLY ON MONDAYS AND THURSDAYS ONLY, Disp: 15 tablet, Rfl: 6 .  montelukast (SINGULAIR) 10 MG tablet, TAKE 1 TABLET BY MOUTH EVERYDAY AT BEDTIME (Patient taking differently: Take 10 mg by mouth at bedtime. ), Disp: 90 tablet, Rfl: 3 .  Multiple Vitamins-Minerals (MULTIVITAMIN WITH MINERALS) tablet, Take 1 tablet by mouth daily., Disp: , Rfl:  .  mupirocin ointment (BACTROBAN) 2 %, APPLY TO AFFECTED AREA TWICE A DAY (Patient taking differently: Apply 1 application topically 2 (two) times daily as needed (irritation). ), Disp: 22 g, Rfl: 0 .  NARCAN 4 MG/0.1ML LIQD nasal spray kit, Place 1 spray into the nose once. , Disp: , Rfl:  .  nicotine polacrilex ([employer] NICOTINE POLACRILEX) 4 MG gum, Take 1 each (4 mg total) by mouth as needed for smoking cessation., Disp: 100 tablet, Rfl:  2 .  NON FORMULARY, Uses a C-PAP at bedtime, Disp: , Rfl:  .  nystatin (MYCOSTATIN) 100000 UNIT/ML suspension, Take 5 mLs (500,000 Units total) by mouth 4 (four) times daily. Swish and swallow. (Patient taking differently: Take 5 mLs by mouth daily as needed (thrush). Swish and swallow.), Disp: 60 mL, Rfl: 3 .  Omega-3 Fatty Acids (FISH OIL) 1000 MG CAPS, Take 1,000 mg by mouth daily. , Disp: , Rfl:  .  omeprazole (PRILOSEC) 20 MG capsule, Take 1 capsule (20 mg total) by mouth daily., Disp: 90 capsule, Rfl: 3 .  OXYGEN, Inhale into the lungs., Disp: , Rfl:  .  potassium chloride SA (KLOR-CON M20) 20 MEQ tablet, Take 1 tablet (20 mEq total) by mouth 2 (two) times a week. Take 1 tablet twice weekly on Monday and Thursday., Disp: 15 tablet, Rfl: 6 .  promethazine (PHENERGAN) 25 MG tablet, Take 1 tablet (25 mg total) by mouth every 6 (six) hours as needed for nausea., Disp: 30 tablet, Rfl: 1 .  Semaglutide,0.25 or 0.5MG/DOS, (OZEMPIC, 0.25 OR 0.5 MG/DOSE,) 2 MG/1.5ML SOPN, Inject 0.25 mg into the skin  once a week. Inject 0.25 mg into the skin once weekly on Thursdays. (minus 2 clicks), Disp: , Rfl:  .  sertraline (ZOLOFT) 25 MG tablet, Take 1 tablet (25 mg total) by mouth daily., Disp: 30 tablet, Rfl: 0 .  sertraline (ZOLOFT) 50 MG tablet, TAKE 1 TABLET BY MOUTH EVERY DAY (Patient taking differently: Take 50 mg by mouth daily. ), Disp: 90 tablet, Rfl: 1 .  TOVIAZ 8 MG TB24 tablet, Take 8 mg by mouth at bedtime. , Disp: , Rfl:  No current facility-administered medications for this visit.  Facility-Administered Medications Ordered in Other Visits:  .  technetium tetrofosmin (TC-MYOVIEW) injection 78.0 millicurie, 04.4 millicurie, Intravenous, Once PRN, Radford Pax, Eber Hong, MD   Garner Nash, DO [facility] 02/24/2019 8:08 AM

## 2019-02-26 DIAGNOSIS — H25813 Combined forms of age-related cataract, bilateral: Secondary | ICD-10-CM | POA: Diagnosis not present

## 2019-02-26 DIAGNOSIS — Z794 Long term (current) use of insulin: Secondary | ICD-10-CM | POA: Diagnosis not present

## 2019-02-26 DIAGNOSIS — E1136 Type 2 diabetes mellitus with diabetic cataract: Secondary | ICD-10-CM | POA: Diagnosis not present

## 2019-02-26 LAB — HM DIABETES EYE EXAM

## 2019-02-27 ENCOUNTER — Other Ambulatory Visit: Payer: Self-pay | Admitting: Family Medicine

## 2019-02-27 DIAGNOSIS — J449 Chronic obstructive pulmonary disease, unspecified: Secondary | ICD-10-CM

## 2019-02-28 ENCOUNTER — Other Ambulatory Visit: Payer: Self-pay | Admitting: Family Medicine

## 2019-03-02 ENCOUNTER — Ambulatory Visit: Payer: Medicare Other | Admitting: Family Medicine

## 2019-03-02 DIAGNOSIS — G894 Chronic pain syndrome: Secondary | ICD-10-CM | POA: Diagnosis not present

## 2019-03-02 DIAGNOSIS — Z79899 Other long term (current) drug therapy: Secondary | ICD-10-CM | POA: Diagnosis not present

## 2019-03-02 DIAGNOSIS — M5136 Other intervertebral disc degeneration, lumbar region: Secondary | ICD-10-CM | POA: Diagnosis not present

## 2019-03-02 DIAGNOSIS — M25511 Pain in right shoulder: Secondary | ICD-10-CM | POA: Diagnosis not present

## 2019-03-02 DIAGNOSIS — M25551 Pain in right hip: Secondary | ICD-10-CM | POA: Diagnosis not present

## 2019-03-03 ENCOUNTER — Ambulatory Visit
Admission: RE | Admit: 2019-03-03 | Discharge: 2019-03-03 | Disposition: A | Discharge status: Home / Self Care | Payer: Medicare Other | Source: Ambulatory Visit | Attending: Pulmonary Disease | Admitting: Pulmonary Disease

## 2019-03-03 ENCOUNTER — Other Ambulatory Visit: Payer: Self-pay

## 2019-03-03 ENCOUNTER — Ambulatory Visit (INDEPENDENT_AMBULATORY_CARE_PROVIDER_SITE_OTHER): Payer: Medicare Other | Admitting: Pulmonary Disease

## 2019-03-03 ENCOUNTER — Encounter: Payer: Self-pay | Admitting: Pulmonary Disease

## 2019-03-03 VITALS — BP 122/76 | HR 48 | Temp 97.1°F | Ht 67.0 in | Wt 364.1 lb

## 2019-03-03 DIAGNOSIS — J449 Chronic obstructive pulmonary disease, unspecified: Secondary | ICD-10-CM | POA: Diagnosis not present

## 2019-03-03 DIAGNOSIS — J984 Other disorders of lung: Secondary | ICD-10-CM

## 2019-03-03 DIAGNOSIS — R911 Solitary pulmonary nodule: Secondary | ICD-10-CM

## 2019-03-03 DIAGNOSIS — F17210 Nicotine dependence, cigarettes, uncomplicated: Secondary | ICD-10-CM | POA: Diagnosis not present

## 2019-03-03 DIAGNOSIS — E662 Morbid (severe) obesity with alveolar hypoventilation: Secondary | ICD-10-CM

## 2019-03-03 DIAGNOSIS — J9611 Chronic respiratory failure with hypoxia: Secondary | ICD-10-CM

## 2019-03-03 DIAGNOSIS — R918 Other nonspecific abnormal finding of lung field: Secondary | ICD-10-CM | POA: Diagnosis not present

## 2019-03-03 DIAGNOSIS — J439 Emphysema, unspecified: Secondary | ICD-10-CM | POA: Diagnosis not present

## 2019-03-03 MED ORDER — TRELEGY ELLIPTA 100-62.5-25 MCG/INH IN AEPB: 1.0000 | INHALATION_SPRAY | Freq: Every day | RESPIRATORY_TRACT | 3 refills | Status: DC

## 2019-03-03 MED ORDER — ALBUTEROL SULFATE HFA 108 (90 BASE) MCG/ACT IN AERS: 2.0000 | INHALATION_SPRAY | Freq: Four times a day (QID) | RESPIRATORY_TRACT | 3 refills | Status: DC | PRN

## 2019-03-03 NOTE — Patient Instructions (Addendum)
Thank you for visiting Dr. Valeta Harms at [facility]. Today we recommend the following:  Meds ordered this encounter  Medications  . albuterol (VENTOLIN HFA) 108 (90 Base) MCG/ACT inhaler    Sig: Inhale 2 puffs into the lungs every 6 (six) hours as needed for wheezing or shortness of breath.    Dispense:  18 g    Refill:  3  . Fluticasone-Umeclidin-Vilant (TRELEGY ELLIPTA) 100-62.5-25 MCG/INH AEPB    Sig: Inhale 1 puff into the lungs daily.    Dispense:  180 each    Refill:  3    Order Specific Question:   Lot Number?    Answer:   [id number]    Order Specific Question:   Manufacturer?    Answer:   [employer] [[id number]]    Attempt POC qualification again.   You must quit smoking or vaping. This is the single most important thing that you can do to improve your lung health.   S = Set a quit date. T = Tell family, friends, and the people around you that you plan to quit. A = Anticipate or plan ahead for the tough times you'll face while quitting. R = Remove cigarettes and other tobacco products from your home, car, and work T = Talk to [place] about getting help to quit  If you need help feel free to reach out to our office, [place] [facility]: 2697600833, call 1-800-QUIT-NOW, or visit [email or web address]   Return in about 3 months (around 05/31/2019).    Please do your part to reduce the spread of COVID-19.

## 2019-03-03 NOTE — Progress Notes (Signed)
Synopsis: Referred in November 2019 for COPD by [name], MD  Subjective:   PATIENT ID: [name] GENDER: female DOB: [birth year], MRN: [id number]  Chief Complaint  Patient presents with  . Follow-up    f/u COPD. O2 2L. SOB with exertion while on oxygen on.     PMH of OSA, OHS, on BiPAP, Chronic respiratory failure on 2L day and night. Long time smoker past 40 years 1.5ppd. Recent CT scan with RUL and LUL lung nodule. She has trouble walking and talking at the same time. Currently just managed with albuterol inhaler. Also on trelegy inhaler. Planning to go on the 2nd for evaluation of lap-band surgery. Its a 6 month program that she is enrolling in. She does have allergies to seasons, outdoor and pollen.  Overall the patient has had significant shortness of breath with exertion or minimal activity.  She does understand that her weight plays a large role in this.  She has been trying to watch what she is eating.  She has been successful at quitting smoking within the past year.  She has a sleep study scheduled for evaluation of her OSA and getting a new machine and new mask.  She has daily cough, denies sputum production, denies recent exacerbation.  She has been told that she has COPD but has not had full PFTs before.  She does have chronic diastolic heart failure that is managed with diuretics.  She watches her fluid intake.  She has noticed some edema in her lower extremities.  OV 02/13/2018: She has been doing really well. She started a new fluid pill and feels like it has been better results. She has lost several pounds proximately 20 since she was last seen with [place].  She also obtained a new BiPAP machine for treatment of her obstructive sleep apnea.  She really likes the new machine and has been sleeping so much better.  She has been using a new humidifier which has made it tolerable.  She has been checking her oxygen on a regular basis.  Most of the time with her new finger O2 sat  probe she is in the mid 90s.  And has not needed oxygen at rest and even with exertion in most places.  She does keep it with her just in case.  She is still using oxygen at nighttime with her Pap therapy.  At this point her respiratory complaints are approximately the same.  She does have a follow-up CT scan for March 2020 for bilateral lung nodules and PFTs that have been ordered but not yet completed.  Office spirometry last time revealed reduced spirometry no evidence of obstruction.  OV 08/18/2018: Seen to day in follow up and having PFTS completed today in the offices. She recently describes and episode of getting a steroid injection her had for trigger finger. She was stressed out because having her glucose elevated. She unfortunately started smoking again. She has started her chantix again. She is going to stop again. She stopped for 8 months before restarted.  Pulmonary function test completed today which revealed an FVC of 2.29 L, 65% predicted, FEV1, 1.58 L, 57% predicted, ERV 46%, DLCO 49% predicted.  Patient has a BMI of 55.67.  She states that she cannot go out during the heat of the day.  She tries to avoid leaving her home between noon and 6 PM.  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 Unfortunately she is still smoking. She states to [facility] that she has reduced to 4 cigarettes per day but admits she smokes more bad days. She is BIPAP machine at night and follows with a sleep clinic. She has been seen by ortho and has should pain. And is planning possibly having shoulder sugeries for repair and trigger fingers. Currently holding due to elective surgeries. Also has planned c-scope by GI.  Patient still has dyspnea on exertion throughout the house.  She lives alone.  Past Medical History:  Diagnosis Date  . Alcoholism ([place])   . Anxiety   .  Arthritis 04-10-11   hips, shoulders, back  . Asthma   . Bipolar disorder ([place])   . Cardiomegaly   . Cervical cancer ([place]) 1993   cervical, no treatment done, went away per pt  . Cervical dysplasia or atypia 04-10-11   '93- once dx.-got pregnant-no intervention, then postpartum, no dysplasia found  . CHF (congestive heart failure) ([place])    no cardiologist 2014 dx, none now  . Chronic hypoxemic respiratory failure ([place])   . Chronic kidney disease    stage 3 kidney disease  . 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 Pathology showed Condyloma, gluteal CONDYLOMA ACUMINATUM.   [name] Kitchen COPD (chronic obstructive pulmonary disease) ([place])   . Depression   . Diabetes mellitus without complication ([place])   . Dyspnea    with actity, sitting  . Fall 12/16/2016  . Fibrocystic breast disease   . GERD (gastroesophageal reflux disease)   . Grade I diastolic dysfunction 41/74/0814   Noted on ECHO  . Hepatitis    hep B-count is low at present,doesn't register  . History of PSVT (paroxysmal supraventricular tachycardia)   . Hypercalcemia   . Hyperlipidemia   . Hypertension   . Incomplete left bundle branch block (LBBB) 09/24/2018   Noted on EKG  . Lung nodule 11/2017   right and left lung  . Migraine headache    none recent  . Morbid obesity ([place]) 03/27/2006  . Neuropathy   . Pneumonia    walking pneu 15 yrs. ago  . Skin lesion 03/15/2011   In gluteal crease now s/p removal by Dr. [name] of General Surgery on 3/12. Path shows condyloma.     . Sleep apnea 04-10-11   uses cpap, pt does not know settings  . Trigger finger    left third  . Urge incontinence of urine      Family History  Problem Relation Age of Onset  . Pulmonary embolism Mother   . Stroke Father   . Alcohol abuse Father   . Pneumonia Father   . Hypertension Father   . Breast cancer Maternal Aunt   . Hypertension Other   . Diabetes Other        Aunts and cousins  . Asthma Sister   . Depression  Sister   . Arthritis Sister   . Sickle cell anemia Son      Past Surgical History:  Procedure Laterality Date  . ANAL FISTULECTOMY  04/17/2011   Procedure: FISTULECTOMY ANAL;  Surgeon: Imogene Burn. [name], MD;  Location: [facility];  Service: General;  Laterality: N/A;  Excision of Condyloma Gluteal Cleft   . BOTOX INJECTION N/A 12/29/2018   Procedure: BOTOX INJECTION(100 UNITS)  WITH CYSTOSCOPY;  Surgeon: Ceasar Mons, MD;  Location: [facility];  Service: Urology;  Laterality: N/A;  . BREAST CYST EXCISION  bilateral breast, 3 cysts removed from each breast  . BREAST EXCISIONAL BIOPSY Right    x 3  . BREAST EXCISIONAL BIOPSY Left    x 3  . CARPAL TUNNEL RELEASE Bilateral   . Cervical biospy    . CERVICAL FUSION  04-10-11   x3- cervical fusion with plating and screws-Dr. [name]  . COLONOSCOPY WITH PROPOFOL N/A 07/06/2015   Procedure: COLONOSCOPY WITH PROPOFOL;  Surgeon: [name], MD;  Location: [facility];  Service: Endoscopy;  Laterality: N/A;  . DOPPLER ECHOCARDIOGRAPHY  04/06/2012   AT [place] 55-60%  . FRACTURE SURGERY     little toe left foot  . I & D EXTREMITY Left 12/18/2016   Procedure: IRRIGATION AND DEBRIDEMENT LEFT FOOT, CLOSURE;  Surgeon: [name], MD;  Location: [place];  Service: Orthopedics;  Laterality: Left;  . [place] SURGERY  04-10-11   x5-Lumbar fusion-retained hardware.(Dr. Louanne Skye)  . MULTIPLE TOOTH EXTRACTIONS    . TEE WITHOUT CARDIOVERSION N/A 04/06/2012   Procedure: TRANSESOPHAGEAL ECHOCARDIOGRAM (TEE);  Surgeon: [name], MD;  Location: [facility];  Service: Cardiovascular;  Laterality: N/A;  . TRIGGER FINGER RELEASE Right    middle finger  . TRIGGER FINGER RELEASE Left 06/28/2016   Procedure: RELEASE TRIGGER FINGER LEFT 3RD FINGER;  Surgeon: [name], MD;  Location: [facility];  Service: Orthopedics;  Laterality: Left;  . TRIGGER FINGER RELEASE Right 06/12/2016   Procedure: RIGHT INDEX FINGER TRIGGER RELEASE;  Surgeon: [name], MD;  Location: [place];  Service: Orthopedics;  Laterality: Right;  . TRIGGER FINGER RELEASE Right 01/19/2018   Procedure: RIGHT RING FINGER TRIGGER FINGER RELEASE;  Surgeon: [name], MD;  Location: [place];  Service: Orthopedics;  Laterality: Right;    Social History   Socioeconomic History  . Marital status: Single    Spouse name: Not on file  . Number of children: 1  . Years of education: 73  . Highest education level: 12th grade  Occupational History  . Occupation: disabled    Comment: Disabled  Tobacco Use  . Smoking status: Current Every Day Smoker    Packs/day: 0.20    Years: 40.00    Pack years: 8.00    Types: Cigarettes    Start date: 01/28/1977  . Smokeless tobacco: Never Used  . Tobacco comment: Quit 8 months 2019 - restart 05/2018   Hx 1.5 PPD  Substance and Sexual Activity  . Alcohol use: No    Alcohol/week: 0.0 standard drinks  . Drug use: Not Currently    Types: Marijuana, "Crack" cocaine    Comment: hx marijuana usemany years ago, Crack -none since 11/2015  . Sexual activity: Not Currently    Partners: Male    Birth control/protection: Post-menopausal  Other Topics Concern  . Not on file  Social History Narrative   Current Social History       Who lives at home: Patient lives alone in one level home; has 4 steps onto porch with a handrail. Has smoke alarms, no throw rugs. Has elevated toilet. Has shower bench in tub with grab rails.    Transportation: Patient has own vehicle and drives herself    Important Relationships "Family and friends"    Pets: None    Likes to eat varied diet. Seafood, fish, roasted [place] breast, vegetables and salads and fruits.   Education / Work:  12 th grade/ None    Interests / Fun: Education officer, museum on phone, watch TV, Be with  family and friends, sit on my porch."    Current Stressors: None    Religious / Personal Beliefs: "God Jesus"    Other: "I love everyone, I wake up with joy in my heart and go to sleep the same  way. I'm a lovable person."                                                                                                   Social Determinants of Health   Financial Resource Strain:   . Difficulty of Paying Living Expenses: Not on file  Food Insecurity:   . Worried About Charity fundraiser in the Last Year: Not on file  . Ran Out of Food in the Last Year: Not on file  Transportation Needs:   . Lack of Transportation (Medical): Not on file  . Lack of Transportation (Non-Medical): Not on file  Physical Activity:   . Days of Exercise per Week: Not on file  . Minutes of Exercise per Session: Not on file  Stress:   . Feeling of Stress : Not on file  Social Connections:   . Frequency of Communication with Friends and Family: Not on file  . Frequency of Social Gatherings with Friends and Family: Not on file  . Attends Religious Services: Not on file  . Active Member of Clubs or Organizations: Not on file  . Attends Archivist Meetings: Not on file  . Marital Status: Not on file  Intimate Partner Violence:   . Fear of Current or Ex-Partner: Not on file  . Emotionally Abused: Not on file  . Physically Abused: Not on file  . Sexually Abused: Not on file     Allergies  Allergen Reactions  . Aspirin Other (See Comments)    Kidney disease  . Chantix [Varenicline Tartrate] Other (See Comments)    On the 1 mg dose, patient experienced hallucintation  . 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 Corticosteroids Other (See Comments)    Hyperglycemia   . Levofloxacin Itching     Outpatient Medications Prior to Visit  Medication Sig Dispense Refill  . ACCU-CHEK SOFTCLIX LANCETS lancets TEST 4 TIMES DAILY 200 each 6  . acetaminophen (TYLENOL) 500 MG tablet Take 1,000 mg by mouth 2 (two) times a day.     . albuterol (VENTOLIN HFA) 108 (90 Base) MCG/ACT inhaler INHALE 2 PUFFS INTO THE LUNGS EVERY 6 (SIX) HOURS AS NEEDED FOR SHORTNESS OF BREATH. 8.5 g  3  . ammonium lactate (LAC-HYDRIN) 12 % lotion APPLY 1 APPLICATION TOPICALLY 2 (TWO) TIMES DAILY AS NEEDED FOR DRY SKIN. 400 mL 3  . atorvastatin (LIPITOR) 40 MG tablet TAKE 1 TABLET BY MOUTH EVERY DAY 90 tablet 3  . b complex vitamins capsule Take 1 capsule by mouth daily.    . baclofen (LIORESAL) 10 MG tablet Take 1 tablet (10 mg total) by mouth 3 (three) times daily as needed for muscle spasms. (Patient taking differently: Take 10 mg by mouth 3 (three) times daily. ) 90 each 0  .  blood glucose meter kit and supplies KIT Dispense based on patient and insurance preference. Use up to four times daily as directed. (FOR ICD-9 250.00, 250.01). 1 each 0  . busPIRone (BUSPAR) 10 MG tablet TAKE 1 TABLET BY MOUTH THREE TIMES A DAY (Patient taking differently: Take 10 mg by mouth 3 (three) times daily. ) 270 tablet 4  . Calcium Citrate-Vitamin D (CALCIUM CITRATE + D3 PO) Take 1 tablet by mouth daily.    . Capsaicin-Menthol (PAIN RELIEF EX) Apply 1 application topically 4 (four) times daily as needed (shoulder pain).     . CHANTIX 0.5 MG tablet TAKE 1 TABLET BY MOUTH TWICE A DAY 60 tablet 0  . diclofenac Sodium (VOLTAREN) 1 % GEL Apply 2 g topically 4 (four) times daily. 500 g 1  . diltiazem (CARDIZEM CD) 120 MG 24 hr capsule Take 1 capsule (120 mg total) by mouth 2 (two) times daily. 180 capsule 3  . docusate sodium (COLACE) 100 MG capsule Take 200 mg by mouth 2 (two) times daily.    . empagliflozin (JARDIANCE) 25 MG TABS tablet Take 25 mg by mouth daily.    . febuxostat (ULORIC) 40 MG tablet Take 40 mg by mouth at bedtime.     . Flaxseed, Linseed, (FLAXSEED OIL) 1000 MG CAPS Take 1,000 mg by mouth daily.    . fluticasone (FLONASE) 50 MCG/ACT nasal spray Place 2 sprays into both nostrils 2 (two) times daily. 16 g 6  . folic acid (FOLVITE) 295 MCG tablet Take 400 mcg by mouth daily.    . furosemide (LASIX) 40 MG tablet Take 2 tablets (80 mg total) by mouth 2 (two) times daily. 180 tablet 3  . gabapentin  (NEURONTIN) 300 MG capsule TAKE 2 CAPSULES (600 MG TOTAL) BY MOUTH 3 (THREE) TIMES DAILY. 540 capsule 2  . glucose blood (ACCU-CHEK AVIVA PLUS) test strip TEST 4 TIMES DAILY (Patient taking differently: 1 each by Other route 3 (three) times daily. TEST 3 TIMES DAILY) 100 each 12  . HYDROcodone-acetaminophen (NORCO) 7.5-325 MG tablet Take 1 tablet by mouth every 6 (six) hours as needed for moderate pain. 20 tablet 0  . hydrOXYzine (ATARAX/VISTARIL) 10 MG tablet Take 1 tablet (10 mg total) by mouth daily. 30 tablet 2  . Insulin Glargine (LANTUS SOLOSTAR) 100 UNIT/ML Solostar Pen Inject 22 Units into the skin daily before breakfast. (Patient taking differently: Inject 20 Units into the skin daily before breakfast. ) 15 mL   . insulin lispro (HUMALOG KWIKPEN) 100 UNIT/ML KwikPen Inject 0.08 mLs (8 Units total) into the skin 3 (three) times daily before meals.    . Insulin Pen Needle (B-D UF III MINI PEN NEEDLES) 31G X 5 MM MISC CHECK SUGARS 4 TIMES A DAY BEFORE MEALS AND AT BEDTIME. 100 each 12  . ketoconazole (NIZORAL) 2 % cream APPLY 1 FINGERTIP AMOUNT TO EACH FOOT DAILY. (Patient taking differently: Apply 1 application topically 2 (two) times daily. ) 30 g 0  . loratadine (CLARITIN) 10 MG tablet Take 1 tablet (10 mg total) by mouth daily. 90 tablet 0  . losartan (COZAAR) 100 MG tablet Take 1 tablet (100 mg total) by mouth daily. 90 tablet 3  . metolazone (ZAROXOLYN) 2.5 MG tablet Take 1 tablet (2.5 mg total) by mouth 2 (two) times a week. TAKE 1 TABLET TWICE WEEKLY ON MONDAYS AND THURSDAYS ONLY 15 tablet 6  . montelukast (SINGULAIR) 10 MG tablet TAKE 1 TABLET BY MOUTH EVERYDAY AT BEDTIME (Patient taking differently: Take 10 mg  by mouth at bedtime. ) 90 tablet 3  . Multiple Vitamins-Minerals (MULTIVITAMIN WITH MINERALS) tablet Take 1 tablet by mouth daily.    . mupirocin ointment (BACTROBAN) 2 % APPLY TO AFFECTED AREA TWICE A DAY (Patient taking differently: Apply 1 application topically 2 (two) times  daily as needed (irritation). ) 22 g 0  . NARCAN 4 MG/0.1ML LIQD nasal spray kit Place 1 spray into the nose once.     . nicotine polacrilex ([employer] NICOTINE POLACRILEX) 4 MG gum Take 1 each (4 mg total) by mouth as needed for smoking cessation. 100 tablet 2  . NON FORMULARY Uses a C-PAP at bedtime    . nystatin (MYCOSTATIN) 100000 UNIT/ML suspension Take 5 mLs (500,000 Units total) by mouth 4 (four) times daily. Swish and swallow. (Patient taking differently: Take 5 mLs by mouth daily as needed (thrush). Swish and swallow.) 60 mL 3  . Omega-3 Fatty Acids (FISH OIL) 1000 MG CAPS Take 1,000 mg by mouth daily.     [name] Kitchen omeprazole (PRILOSEC) 20 MG capsule Take 1 capsule (20 mg total) by mouth daily. 90 capsule 3  . OXYGEN Inhale into the lungs.    . potassium chloride SA (KLOR-CON M20) 20 MEQ tablet Take 1 tablet (20 mEq total) by mouth 2 (two) times a week. Take 1 tablet twice weekly on Monday and Thursday. 15 tablet 6  . promethazine (PHENERGAN) 25 MG tablet Take 1 tablet (25 mg total) by mouth every 6 (six) hours as needed for nausea. 30 tablet 1  . Semaglutide,0.25 or 0.5MG/DOS, (OZEMPIC, 0.25 OR 0.5 MG/DOSE,) 2 MG/1.5ML SOPN Inject 0.25 mg into the skin once a week. Inject 0.25 mg into the skin once weekly on Thursdays. (minus 2 clicks)    . sertraline (ZOLOFT) 50 MG tablet TAKE 1 TABLET BY MOUTH EVERY DAY (Patient taking differently: Take 50 mg by mouth daily. ) 90 tablet 1  . TOVIAZ 8 MG TB24 tablet Take 8 mg by mouth at bedtime.     . TRELEGY ELLIPTA 100-62.5-25 MCG/INH AEPB INHALE 1 PUFF INTO THE LUNGS DAILY FOR 30 DOSES. 180 each 1  . sertraline (ZOLOFT) 25 MG tablet Take 1 tablet (25 mg total) by mouth daily. 30 tablet 0   Facility-Administered Medications Prior to Visit  Medication Dose Route Frequency Provider Last Rate Last Admin  . technetium tetrofosmin (TC-MYOVIEW) injection 10.6 millicurie  26.9 millicurie Intravenous Once PRN Sueanne Margarita, MD        Review of Systems   Constitutional: Negative for chills, fever, malaise/fatigue and weight loss.  HENT: Negative for hearing loss, sore throat and tinnitus.   Eyes: Negative for blurred vision and double vision.  Respiratory: Positive for shortness of breath. Negative for cough, hemoptysis, sputum production, wheezing and stridor.   Cardiovascular: Negative for chest pain, palpitations, orthopnea, leg swelling and PND.  Gastrointestinal: Negative for abdominal pain, constipation, diarrhea, heartburn, nausea and vomiting.  Genitourinary: Negative for dysuria, hematuria and urgency.  Musculoskeletal: Negative for joint pain and myalgias.  Skin: Negative for itching and rash.  Neurological: Negative for dizziness, tingling, weakness and headaches.  Endo/Heme/Allergies: Negative for environmental allergies. Does not bruise/bleed easily.  Psychiatric/Behavioral: Positive for depression. The patient is nervous/anxious. The patient does not have insomnia.   All other systems reviewed and are negative.   Objective:  Physical Exam Vitals reviewed.  Constitutional:      General: She is not in acute distress.    Appearance: She is well-developed. She is obese.  HENT:  Head: Normocephalic and atraumatic.  Eyes:     General: No scleral icterus.    Conjunctiva/sclera: Conjunctivae normal.     Pupils: Pupils are equal, round, and reactive to light.  Neck:     Vascular: No JVD.     Trachea: No tracheal deviation.  Cardiovascular:     Rate and Rhythm: Normal rate and regular rhythm.     Heart sounds: Normal heart sounds. No murmur.  Pulmonary:     Effort: Pulmonary effort is normal. No tachypnea, accessory muscle usage or respiratory distress.     Breath sounds: Normal breath sounds. No stridor. No wheezing, rhonchi or rales.  Abdominal:     Comments: Severely obese pannus   Musculoskeletal:        General: No tenderness.     Cervical back: Neck supple.  Lymphadenopathy:     Cervical: No cervical  adenopathy.  Skin:    General: Skin is warm and dry.     Capillary Refill: Capillary refill takes less than 2 seconds.     Findings: No rash.  Neurological:     Mental Status: She is alert and oriented to person, place, and time.  Psychiatric:        Behavior: Behavior normal.      Vitals:   03/03/19 0919  BP: 122/76  Pulse: (!) 48  Temp: (!) 97.1 F (36.2 C)  TempSrc: Temporal  SpO2: 97%  Weight: (!) 364 lb 1.6 oz (165.2 kg)  Height: _0  (1.702 m)   97% on RA BMI Readings from Last 3 Encounters:  03/03/19 57.03 kg/m  02/16/19 56.01 kg/m  02/12/19 52.43 kg/m   Wt Readings from Last 3 Encounters:  03/03/19 (!) 364 lb 1.6 oz (165.2 kg)  02/16/19 (!) 363 lb (164.7 kg)  02/12/19 (!) 365 lb 6.4 oz (165.7 kg)    Chest Imaging: September 2019 CT chest: Right upper lobe 6 mm lung nodule, irregular, 4 mm irregular dense nodule left upper lobe The patient's images have been independently reviewed by me.    CT chest 08/07/2018: Reviewed images today in the office again with the patient regarding her CT chest in July 2020.  Patient has a 12 mm subsolid nodule within the right upper lobe with slight increase from previous imaging.  Recommending follow-up to document stability. The patient's images have been independently reviewed by me.     Pulmonary Functions Testing Results: PFT Results Latest Ref Rng & Units 08/18/2018  FVC-Pre L 2.32  FVC-Predicted Pre % 66  FVC-Post L 2.29  FVC-Predicted Post % 65  Pre FEV1/FVC % % 68  Post FEV1/FCV % % 69  FEV1-Pre L 1.57  FEV1-Predicted Pre % 56  FEV1-Post L 1.58  DLCO UNC% % 49  DLCO COR %Predicted % 88  TLC L 3.41  TLC % Predicted % 57  RV % Predicted % 51    FeNO: None   Pathology: None  Echocardiogram:  Study Conclusions - Left ventricle: The cavity size was normal. Wall thickness was   normal. Systolic function was normal. The estimated ejection   fraction was in the range of 55% to 60%. Wall motion was  normal;   there were no regional wall motion abnormalities. Doppler   parameters are consistent with abnormal left ventricular   relaxation (grade 1 diastolic dysfunction).  Heart Catheterization: None      Assessment & Plan:      ICD-10-CM   1. Cigarette smoker  F17.210   2. COPD (chronic obstructive pulmonary  disease) with chronic bronchitis ([place])  J44.9   3. Morbid obesity due to excess calories (H[facility])  E66.01   4. Stage 2 moderate COPD by GOLD classification ([place])  J44.9   5. Mixed restrictive and obstructive lung disease ([place])  J43.9    J98.4   6. Nodule of upper lobe of right lung  R91.1   7. Obesity hypoventilation syndrome (HCC)  E66.2   8. Chronic respiratory failure with hypoxia (H[facility])  J96.11    Discussion:    This is a 54 year old morbidly obese female, OSA on BiPAP OHS.  Has moderate COPD with mixed restrictive and obstructiveDefects due to smoking history and COPD.  Unfortunately she is still smoking.  This is the most important thing that she can do for her health.  If she continues to smoke with her current disease state she is going to have continued progressive symptoms and likely continued decline risking worsening of her health or death.  This was again discussed with the patient today.  She has already chronic hypoxemic respiratory failure.  And would like to potentially transition to POC use instead of continuous oxygen but I do not believe that she would qualify for POC but would like to try again.  I have encouraged her to restart the use of her Chantix She needs refills today of Trelegy plus albuterol which she can continue Patient was counseled on weight loss as well as smoking cessation.  These were the 2 most important things that she can do for her health at this time.   Plan Following Extensive Data Review & Interpretation:  . I reviewed prior external note(s) from 02/16/2019 gastroenterology, [place], MD.  Recommending colonoscopy.  02/12/2019  PCP Dr. Caron Presume.  . I reviewed the result(s) of 12/25/2018: Covid negative, multiple baseline hemoglobins elevated, August 2020 300 absolute eosinophil count . I have ordered repeat chest CT imaging has been ordered from last office visit and scheduled for February 2021.  Super D CT imaging has been ordered.  She is post to have this done soon. I have ordered refills of her albuterol as well as refills of her Trelegy inhaler. I have ordered for her to have qualification for a portable oxygen concentrator.  Independent interpretation of tests . Review of patient's 08/07/2018 images revealed 12 mm subsolid right upper lobe nodule slightly increased in size.  His images were reviewed with the patient today.  Plans for a super D CT scheduled.  The patient's images have been independently reviewed by me.     [facility] 03/03/2019 9:32 AM

## 2019-03-04 ENCOUNTER — Other Ambulatory Visit: Payer: Self-pay | Admitting: Emergency Medicine

## 2019-03-04 ENCOUNTER — Telehealth: Payer: Self-pay | Admitting: Pulmonary Disease

## 2019-03-04 ENCOUNTER — Ambulatory Visit: Payer: Medicare Other

## 2019-03-04 ENCOUNTER — Ambulatory Visit: Payer: Medicare Other | Admitting: Specialist

## 2019-03-04 DIAGNOSIS — K6289 Other specified diseases of anus and rectum: Secondary | ICD-10-CM

## 2019-03-04 DIAGNOSIS — J441 Chronic obstructive pulmonary disease with (acute) exacerbation: Secondary | ICD-10-CM

## 2019-03-04 DIAGNOSIS — R911 Solitary pulmonary nodule: Secondary | ICD-10-CM

## 2019-03-04 MED ORDER — NA SULFATE-K SULFATE-MG SULF 17.5-3.13-1.6 GM/177ML PO SOLN: 1.0000 | ORAL | 0 refills | Status: AC

## 2019-03-04 NOTE — Addendum Note (Signed)
Addended by: Wyline Beady on: 03/04/2019 11:31 AM   Modules accepted: Orders

## 2019-03-04 NOTE — Telephone Encounter (Signed)
Received disc from Dr. Valeta Harms box and gave to him for review.

## 2019-03-04 NOTE — Telephone Encounter (Signed)
ATC pt, VM box has not been set up yet. WCB.

## 2019-03-04 NOTE — Telephone Encounter (Signed)
I have reviewed the CT images. Please let the patient know that the nodule is stable and that we will repeat images in 12 months.   Please order a repeat superD CT wo contrast to be complete next Feb 2022. Also. Will need office visit to follow.   Thanks  Disautel, DO [facility] 03/04/2019 4:28 PM

## 2019-03-05 ENCOUNTER — Ambulatory Visit (INDEPENDENT_AMBULATORY_CARE_PROVIDER_SITE_OTHER): Payer: Medicare Other | Admitting: Podiatry

## 2019-03-05 ENCOUNTER — Encounter: Payer: Self-pay | Admitting: Family Medicine

## 2019-03-05 ENCOUNTER — Other Ambulatory Visit: Payer: Self-pay

## 2019-03-05 ENCOUNTER — Other Ambulatory Visit (HOSPITAL_COMMUNITY)
Admission: RE | Admit: 2019-03-05 | Discharge: 2019-03-05 | Disposition: A | Discharge status: Home / Self Care | Payer: Medicare Other | Source: Ambulatory Visit | Attending: Gastroenterology | Admitting: Gastroenterology

## 2019-03-05 ENCOUNTER — Encounter: Payer: Self-pay | Admitting: Podiatry

## 2019-03-05 DIAGNOSIS — Z20822 Contact with and (suspected) exposure to covid-19: Secondary | ICD-10-CM | POA: Diagnosis not present

## 2019-03-05 DIAGNOSIS — M79674 Pain in right toe(s): Secondary | ICD-10-CM | POA: Diagnosis not present

## 2019-03-05 DIAGNOSIS — Z01812 Encounter for preprocedural laboratory examination: Secondary | ICD-10-CM | POA: Insufficient documentation

## 2019-03-05 DIAGNOSIS — M79675 Pain in left toe(s): Secondary | ICD-10-CM | POA: Diagnosis not present

## 2019-03-05 DIAGNOSIS — B351 Tinea unguium: Secondary | ICD-10-CM

## 2019-03-05 DIAGNOSIS — E1142 Type 2 diabetes mellitus with diabetic polyneuropathy: Secondary | ICD-10-CM

## 2019-03-05 LAB — SARS CORONAVIRUS 2 (TAT 6-24 HRS): SARS Coronavirus 2: NEGATIVE

## 2019-03-05 NOTE — Telephone Encounter (Signed)
Spoke with pt. She is aware of Dr. Juline Patch response. Order has been placed for repeat CT scan. Nothing further was needed.

## 2019-03-05 NOTE — Progress Notes (Signed)
Complaint:  Visit Type: Patient returns to my office for continued preventative foot care services. Complaint: Patient states" my nails have grown long and thick and become painful to walk and wear shoes" Patient has been diagnosed with DM with angiopathy. The patient presents for preventative foot care services. No changes to ROS.   Podiatric Exam: Vascular: dorsalis pedis and posterior tibial pulses are palpable bilateral. Capillary return is immediate. Temperature gradient is WNL. Skin turgor WNL  Sensorium: Normal Semmes Weinstein monofilament test. Normal tactile sensation bilaterally. Nail Exam: Pt has thick disfigured discolored nails with subungual debris noted bilateral entire nail hallux through fifth toenails Ulcer Exam: There is no evidence of ulcer or pre-ulcerative changes or infection. Orthopedic Exam: Muscle tone and strength are WNL. No limitations in general ROM. No crepitus or effusions noted. Foot type and digits show no abnormalities. HAV left foot. Skin: No Porokeratosis. No infection or ulcers.  Thick hyperkeratotic skin right foot.  Diagnosis:  Onychomycosis, , Pain in right toe, pain in left toes  Treatment & Plan Procedures and Treatment: Consent by patient was obtained for treatment procedures.   Debridement of mycotic and hypertrophic toenails, 1 through 5 bilateral and clearing of subungual debris. No ulceration, no infection noted.  Return Visit-Office Procedure: Patient instructed to return to the office for a follow up visit 10 weeks  for continued evaluation and treatment.    Gardiner Barefoot DPM

## 2019-03-07 DIAGNOSIS — G4733 Obstructive sleep apnea (adult) (pediatric): Secondary | ICD-10-CM | POA: Diagnosis not present

## 2019-03-07 DIAGNOSIS — M5090 Cervical disc disorder, unspecified, unspecified cervical region: Secondary | ICD-10-CM | POA: Diagnosis not present

## 2019-03-07 DIAGNOSIS — J9621 Acute and chronic respiratory failure with hypoxia: Secondary | ICD-10-CM | POA: Diagnosis not present

## 2019-03-08 ENCOUNTER — Ambulatory Visit: Payer: Medicare Other

## 2019-03-09 ENCOUNTER — Ambulatory Visit (HOSPITAL_COMMUNITY): Payer: Medicare Other | Admitting: Certified Registered Nurse Anesthetist

## 2019-03-09 ENCOUNTER — Other Ambulatory Visit: Payer: Self-pay

## 2019-03-09 ENCOUNTER — Ambulatory Visit (HOSPITAL_COMMUNITY)
Admission: RE | Admit: 2019-03-09 | Discharge: 2019-03-09 | Disposition: A | Discharge status: Home / Self Care | Payer: Medicare Other | Attending: Gastroenterology | Admitting: Gastroenterology

## 2019-03-09 ENCOUNTER — Encounter (HOSPITAL_COMMUNITY)
Admission: RE | Disposition: A | Discharge status: Home / Self Care | Payer: Self-pay | Source: Home / Self Care | Attending: Gastroenterology

## 2019-03-09 DIAGNOSIS — D120 Benign neoplasm of cecum: Secondary | ICD-10-CM | POA: Diagnosis not present

## 2019-03-09 DIAGNOSIS — K6289 Other specified diseases of anus and rectum: Secondary | ICD-10-CM | POA: Diagnosis not present

## 2019-03-09 DIAGNOSIS — J9611 Chronic respiratory failure with hypoxia: Secondary | ICD-10-CM | POA: Diagnosis not present

## 2019-03-09 DIAGNOSIS — J449 Chronic obstructive pulmonary disease, unspecified: Secondary | ICD-10-CM | POA: Insufficient documentation

## 2019-03-09 DIAGNOSIS — K635 Polyp of colon: Secondary | ICD-10-CM | POA: Insufficient documentation

## 2019-03-09 DIAGNOSIS — Z7951 Long term (current) use of inhaled steroids: Secondary | ICD-10-CM | POA: Diagnosis not present

## 2019-03-09 DIAGNOSIS — J441 Chronic obstructive pulmonary disease with (acute) exacerbation: Secondary | ICD-10-CM

## 2019-03-09 DIAGNOSIS — N183 Chronic kidney disease, stage 3 unspecified: Secondary | ICD-10-CM | POA: Insufficient documentation

## 2019-03-09 DIAGNOSIS — E1140 Type 2 diabetes mellitus with diabetic neuropathy, unspecified: Secondary | ICD-10-CM | POA: Insufficient documentation

## 2019-03-09 DIAGNOSIS — Z794 Long term (current) use of insulin: Secondary | ICD-10-CM | POA: Diagnosis not present

## 2019-03-09 DIAGNOSIS — Z79899 Other long term (current) drug therapy: Secondary | ICD-10-CM | POA: Diagnosis not present

## 2019-03-09 DIAGNOSIS — K648 Other hemorrhoids: Secondary | ICD-10-CM | POA: Diagnosis not present

## 2019-03-09 DIAGNOSIS — F419 Anxiety disorder, unspecified: Secondary | ICD-10-CM | POA: Insufficient documentation

## 2019-03-09 DIAGNOSIS — I130 Hypertensive heart and chronic kidney disease with heart failure and stage 1 through stage 4 chronic kidney disease, or unspecified chronic kidney disease: Secondary | ICD-10-CM | POA: Diagnosis not present

## 2019-03-09 DIAGNOSIS — I509 Heart failure, unspecified: Secondary | ICD-10-CM | POA: Diagnosis not present

## 2019-03-09 DIAGNOSIS — G4730 Sleep apnea, unspecified: Secondary | ICD-10-CM | POA: Insufficient documentation

## 2019-03-09 DIAGNOSIS — E1122 Type 2 diabetes mellitus with diabetic chronic kidney disease: Secondary | ICD-10-CM | POA: Insufficient documentation

## 2019-03-09 DIAGNOSIS — F17210 Nicotine dependence, cigarettes, uncomplicated: Secondary | ICD-10-CM | POA: Diagnosis not present

## 2019-03-09 DIAGNOSIS — N3941 Urge incontinence: Secondary | ICD-10-CM | POA: Insufficient documentation

## 2019-03-09 DIAGNOSIS — I471 Supraventricular tachycardia: Secondary | ICD-10-CM | POA: Diagnosis not present

## 2019-03-09 DIAGNOSIS — Z6841 Body Mass Index (BMI) 40.0 and over, adult: Secondary | ICD-10-CM | POA: Diagnosis not present

## 2019-03-09 DIAGNOSIS — E785 Hyperlipidemia, unspecified: Secondary | ICD-10-CM | POA: Diagnosis not present

## 2019-03-09 DIAGNOSIS — Z8541 Personal history of malignant neoplasm of cervix uteri: Secondary | ICD-10-CM | POA: Insufficient documentation

## 2019-03-09 DIAGNOSIS — K219 Gastro-esophageal reflux disease without esophagitis: Secondary | ICD-10-CM | POA: Insufficient documentation

## 2019-03-09 DIAGNOSIS — Z9981 Dependence on supplemental oxygen: Secondary | ICD-10-CM | POA: Diagnosis not present

## 2019-03-09 DIAGNOSIS — F319 Bipolar disorder, unspecified: Secondary | ICD-10-CM | POA: Diagnosis not present

## 2019-03-09 HISTORY — PX: BIOPSY: SHX5522

## 2019-03-09 HISTORY — PX: POLYPECTOMY: SHX5525

## 2019-03-09 HISTORY — PX: COLONOSCOPY WITH PROPOFOL: SHX5780

## 2019-03-09 LAB — GLUCOSE, CAPILLARY
Glucose-Capillary: 120 mg/dL — ABNORMAL HIGH (ref 70–99)
Glucose-Capillary: 115 mg/dL — ABNORMAL HIGH (ref 70–99)

## 2019-03-09 SURGERY — COLONOSCOPY WITH PROPOFOL
Anesthesia: Monitor Anesthesia Care

## 2019-03-09 MED ORDER — SODIUM CHLORIDE 0.9 % IV SOLN: INTRAVENOUS | Status: DC

## 2019-03-09 MED ORDER — PROPOFOL 500 MG/50ML IV EMUL
INTRAVENOUS | Status: DC | PRN
  Administered 2019-03-09: 100 ug/kg/min via INTRAVENOUS

## 2019-03-09 MED ORDER — LACTATED RINGERS IV SOLN
INTRAVENOUS | Status: AC | PRN
  Administered 2019-03-09: 1000 mL via INTRAVENOUS

## 2019-03-09 MED ORDER — PROPOFOL 10 MG/ML IV BOLUS
INTRAVENOUS | Status: DC | PRN
  Administered 2019-03-09: 50 mg via INTRAVENOUS
  Administered 2019-03-09: 20 mg via INTRAVENOUS

## 2019-03-09 SURGICAL SUPPLY — 22 items

## 2019-03-09 NOTE — Transfer of Care (Signed)
Immediate Anesthesia Transfer of Care Note  Patient: [name]  Procedure(s) Performed: COLONOSCOPY WITH PROPOFOL (N/A ) BIOPSY POLYPECTOMY  Patient Location: PACU  Anesthesia Type:MAC  Level of Consciousness: awake, alert  and oriented  Airway & Oxygen Therapy: Patient Spontanous Breathing and Patient connected to face mask oxygen  Post-op Assessment: Report given to RN  Post vital signs: Reviewed and stable  Last Vitals:  Vitals Value Taken Time  BP    Temp    Pulse    Resp    SpO2      Last Pain:  Vitals:   03/09/19 1140  TempSrc: Oral         Complications: No apparent anesthesia complications

## 2019-03-09 NOTE — Op Note (Signed)
[facility] Patient Name: [name] Procedure Date: 03/09/2019 MRN: [id number] Attending MD: Thornton Park MD, MD Date of Birth: February 19, 1965 CSN: [id number] Age: 54 Admit Type: Inpatient Procedure:                Colonoscopy Indications:              Rectal fullness without rectal bleeding                           Normal colonoscopy with Dr. Amedeo Plenty 2017 Providers:                Thornton Park MD, MD, Glori Bickers, RN, Marguerita Merles, Technician, Theodora Blow, Technician Referring MD:              Medicines:                Monitored Anesthesia Care Complications:            No immediate complications. Estimated blood loss:                            Minimal. Estimated Blood Loss:     Estimated blood loss was minimal. Procedure:                Pre-Anesthesia Assessment:                           - Prior to the procedure, a History and Physical                            was performed, and patient medications and                            allergies were reviewed. The patient's tolerance of                            previous anesthesia was also reviewed. The risks                            and benefits of the procedure and the sedation                            options and risks were discussed with the patient.                            All questions were answered, and informed consent                            was obtained. Prior Anticoagulants: The patient has                            taken no previous anticoagulant or antiplatelet                            agents. ASA  Grade Assessment: III - A patient with                            severe systemic disease. After reviewing the risks                            and benefits, the patient was deemed in                            satisfactory condition to undergo the procedure.                           After obtaining informed consent, the colonoscope   was passed under direct vision. Throughout the                            procedure, the patient's blood pressure, pulse, and                            oxygen saturations were monitored continuously. The                            CF-HQ190L ([id number]) Olympus colonoscope was                            introduced through the anus and advanced to the 3                            cm into the ileum. A second forward view of the                            right colon was performed. The colonoscopy was                            performed without difficulty. The patient tolerated                            the procedure well. The quality of the bowel                            preparation was good. The terminal ileum, ileocecal                            valve, appendiceal orifice, and rectum were                            photographed. Scope In: 12:58:57 PM Scope Out: 1:14:26 PM Scope Withdrawal Time: 0 hours 10 minutes 41 seconds  Total Procedure Duration: 0 hours 15 minutes 29 seconds  Findings:      The perianal and digital rectal examinations were normal.      Non-bleeding internal hemorrhoids were found. The hemorrhoids were small.      A less than 1 mm polyp was found in the cecum. The polyp was flat. The  polyp was removed with a cold biopsy forceps because of the small size,       flat nature of the polyp, and location to the AO I was unsuccessful with       a snare. Resection and retrieval were complete. Estimated blood loss was       minimal.      A 4 mm polyp was found in the ileocecal valve. The polyp was sessile.       The polyp was removed with a cold snare. Resection and retrieval were       complete. Estimated blood loss was minimal.      A 1 mm polyp was found in the ascending colon. The polyp was sessile.       The polyp was removed with a cold biopsy forceps. Resection and       retrieval were complete. Estimated blood loss was minimal.      The exam was otherwise  without abnormality on direct and retroflexion       views. Impression:               - Small non-bleeding internal hemorrhoids.                           - No identified rectal mass or polyp.                           - One less than 1 mm polyp in the cecum, removed                            with a cold biopsy forceps. Resected and retrieved.                           - One 4 mm polyp at the ileocecal valve, removed                            with a cold snare. Resected and retrieved.                           - One 1 mm polyp in the ascending colon, removed                            with a cold biopsy forceps. Resected and retrieved.                           - The examination was otherwise normal on direct                            and retroflexion views. Moderate Sedation:      Not Applicable - Patient had care per Anesthesia. Recommendation:           - Patient has a contact number available for                            emergencies. The signs and symptoms of potential  delayed complications were discussed with the                            patient. Return to normal activities tomorrow.                            Written discharge instructions were provided to the                            patient.                           - Resume previous diet.                           - Continue present medications.                           - Await pathology results.                           - Repeat colonoscopy date to be determined after                            pending pathology results are reviewed for                            surveillance.                           - Will schedule anorectal manometry for further                            evaluation. Procedure Code(s):        --- Professional ---                           [phone], Colonoscopy, flexible; with removal of                            tumor(s), polyp(s), or other lesion(s) by snare                             technique                           45380, 17, Colonoscopy, flexible; with biopsy,                            single or multiple Diagnosis Code(s):        --- Professional ---                           K63.5, Polyp of colon                           K64.8, Other hemorrhoids  K62.89, Other specified diseases of anus and rectum CPT copyright 2019 [employer]. All rights reserved. The codes documented in this report are preliminary and upon coder review may  be revised to meet current compliance requirements. Thornton Park MD, MD 03/09/2019 1:28:25 PM This report has been signed electronically. Number of Addenda: 0

## 2019-03-09 NOTE — Discharge Instructions (Signed)
YOU HAD AN ENDOSCOPIC PROCEDURE TODAY: Refer to the procedure report and other information in the discharge instructions given to you for any specific questions about what was found during the examination. If this information does not answer your questions, please call [facility] office at [phone] to clarify.  ° °YOU SHOULD EXPECT: Some feelings of bloating in the abdomen. Passage of more gas than usual. Walking can help get rid of the air that was put into your GI tract during the procedure and reduce the bloating. If you had a lower endoscopy (such as a colonoscopy or flexible sigmoidoscopy) you may notice spotting of blood in your stool or on the toilet paper. Some abdominal soreness may be present for a day or two, also. ° °DIET: Your first meal following the procedure should be a light meal and then it is ok to progress to your normal diet. A half-sandwich or bowl of soup is an example of a good first meal. Heavy or fried foods are harder to digest and may make you feel nauseous or bloated. Drink plenty of fluids but you should avoid alcoholic beverages for 24 hours. If you had a esophageal dilation, please see attached instructions for diet.   ° °ACTIVITY: Your care partner should take you home directly after the procedure. You should plan to take it easy, moving slowly for the rest of the day. You can resume normal activity the day after the procedure however YOU SHOULD NOT DRIVE, use power tools, machinery or perform tasks that involve climbing or major physical exertion for 24 hours (because of the sedation medicines used during the test).  ° °SYMPTOMS TO REPORT IMMEDIATELY: °A gastroenterologist can be reached at any hour. Please call [phone]  for any of the following symptoms:  °Following lower endoscopy (colonoscopy, flexible sigmoidoscopy) °Excessive amounts of blood in the stool  °Significant tenderness, worsening of abdominal pains  °Swelling of the abdomen that is new, acute  °Fever of 100° or  higher  °Following upper endoscopy (EGD, EUS, ERCP, esophageal dilation) °Vomiting of blood or coffee ground material  °New, significant abdominal pain  °New, significant chest pain or pain under the shoulder blades  °Painful or persistently difficult swallowing  °New shortness of breath  °Black, tarry-looking or red, bloody stools ° °FOLLOW UP:  °If any biopsies were taken you will be contacted by phone or by letter within the next 1-3 weeks. Call [phone]  if you have not heard about the biopsies in 3 weeks.  °Please also call with any specific questions about appointments or follow up tests. ° °

## 2019-03-09 NOTE — Anesthesia Postprocedure Evaluation (Signed)
Anesthesia Post Note  Patient: [name]  Procedure(s) Performed: COLONOSCOPY WITH PROPOFOL (N/A ) BIOPSY POLYPECTOMY     Patient location during evaluation: PACU Anesthesia Type: MAC Level of consciousness: awake and alert Pain management: pain level controlled Vital Signs Assessment: post-procedure vital signs reviewed and stable Respiratory status: spontaneous breathing, nonlabored ventilation, respiratory function stable and patient connected to nasal cannula oxygen Cardiovascular status: stable and blood pressure returned to baseline Postop Assessment: no apparent nausea or vomiting Anesthetic complications: no    Last Vitals:  Vitals:   03/09/19 1335 03/09/19 1350  BP: (!) 125/56   Pulse:  86  Resp: (!) 21 (!) 21  Temp:    SpO2:      Last Pain:  Vitals:   03/09/19 1350  TempSrc:   PainSc: 0-No pain                 [name] S

## 2019-03-09 NOTE — H&P (Addendum)
Referring Provider: Lind Covert, *  Primary Care Physician: [name], MD   Reason for Consultation: Rectal knot   IMPRESSION:  Rectal fullness without rectal bleeding  BMI 56  COPD on oxygen   Acute onset of rectal fullness of unclear etiology. Patient declined rectal examination today. Wishes to pursue colonoscopy instead. Differential is concerning for polyp or mass. Unfortunately, Covid restrictions are limiting outpatient endoscopy at the hospital. Will proceed as soon as the restrictions are lifted. In the meantime, I've recommended a daily stool bulking agent such as Metamucil.   PLAN:   Colonoscopy  The nature of the procedure, as well as the risks, benefits, and alternatives were carefully and thoroughly reviewed with the patient. Ample time for discussion and questions allowed. The patient understood, was satisfied, and agreed to proceed.   Please see the "Patient Instructions" section for addition details about the plan.   HPI: [name] is a 54 y.o. female referred by Dr. Caron Presume for a change at her rectum. The history is obtained through the patient and review of her electronic health record.   On 02/08/2019, while having a bowel movement, "I sneezed and a big knot came on the inside of my butt hole". She has felt a lump on the inside of rectum on the right since that time. Most aware of the fullness when defecating and blowing her nose. She is aware of the fullness but denies any pain. Feels that the fullness increased in size after her evaluation by Dr. Caron Presume.   No history of similar symptoms. No blood or mucous in the stool. No history of rectal trauma or foreign body. No constipation or diarrhea. No straining. No sense of incomplete evacuation. No history of manual assistance with defecation.   No blood or mucous. No history of similar symptoms in the past. No other associated symptoms. No identified exacerbating or relieving features.   No  hemorrhoids on a normal screening colonoscopy with Dr. Amedeo Plenty 07/06/15   No known family history of colon cancer or polyps. No family history of uterine/endometrial cancer, pancreatic cancer or gastric/stomach cancer.      Past Medical History:  Diagnosis Date  . Anxiety   . Arthritis 04-10-11   hips, shoulders, back  . Asthma   . Bipolar disorder ([place])   . Cancer ([place]) 1993   cervical, no treatment done, went away per pt  . Cardiomegaly   . Cervical dysplasia or atypia 04-10-11   '93- once dx.-got pregnant-no intervention, then postpartum, no dysplasia found  . CHF (congestive heart failure) ([place])    no cardiologist 2014 dx, none now  . Chronic hypoxemic respiratory failure ([place])   . Chronic kidney disease    stage 3 kidney disease  . 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 Pathology showed Condyloma, gluteal CONDYLOMA ACUMINATUM.   [name] Kitchen COPD (chronic obstructive pulmonary disease) ([place])   . Diabetes mellitus without complication ([place])   . Dyspnea    with actity, sitting  . Fall 12/16/2016  . Fibrocystic breast disease   . GERD (gastroesophageal reflux disease)   . Grade I diastolic dysfunction 24/82/5003   Noted on ECHO  . Hepatitis    hep B-count is low at present,doesn't register  . History of PSVT (paroxysmal supraventricular tachycardia)   . Hypercalcemia   . Hyperlipidemia   . Hypertension   . Incomplete left bundle branch block (LBBB) 09/24/2018   Noted on EKG  . Lung nodule 11/2017   right  and left lung  . Migraine headache    none recent  . Morbid obesity ([place]) 03/27/2006  . Neuropathy   . Pneumonia    walking pneu 15 yrs. ago  . Skin lesion 03/15/2011   In gluteal crease now s/p removal by Dr. [name] of General Surgery on 3/12. Path shows condyloma.   . Sleep apnea 04-10-11   uses cpap, pt does not know settings  . Sleep apnea   . Trigger finger    left third  . Urge incontinence of urine         Past Surgical History:  Procedure  Laterality Date  . ANAL FISTULECTOMY  04/17/2011   Procedure: FISTULECTOMY ANAL; Surgeon: Imogene Burn. [name], MD; Location: [facility]; Service: General; Laterality: N/A; Excision of Condyloma Gluteal Cleft   . BACK SURGERY  04-10-11   x5-Lumbar fusion-retained hardware.(Dr. Louanne Skye)  . BOTOX INJECTION N/A 12/29/2018   Procedure: BOTOX INJECTION(100 UNITS) WITH CYSTOSCOPY; Surgeon: Ceasar Mons, MD; Location: [facility]; Service: Urology; Laterality: N/A;  . BREAST CYST EXCISION     bilateral breast, 3 cysts removed from each breast  . BREAST EXCISIONAL BIOPSY Right    x 3  . BREAST EXCISIONAL BIOPSY Left    x 3  . CARPAL TUNNEL RELEASE Bilateral   . Cervical biospy    . COLONOSCOPY WITH PROPOFOL N/A 07/06/2015   Procedure: COLONOSCOPY WITH PROPOFOL; Surgeon: [name], MD; Location: [facility]; Service: Endoscopy; Laterality: N/A;  . DOPPLER ECHOCARDIOGRAPHY  04/06/2012   AT [place] 55-60%  . FRACTURE SURGERY     little toe left foot  . I & D EXTREMITY Left 12/18/2016   Procedure: IRRIGATION AND DEBRIDEMENT LEFT FOOT, CLOSURE; Surgeon: [name], MD; Location: [facility]; Service: Orthopedics; Laterality: Left;  [name] Kitchen MULTIPLE TOOTH EXTRACTIONS    . NECK SURGERY  04-10-11   x3- cervical fusion with plating and screws-Dr. [name]  . TEE WITHOUT CARDIOVERSION N/A 04/06/2012   Procedure: TRANSESOPHAGEAL ECHOCARDIOGRAM (TEE); Surgeon: [name], MD; Location: [facility] ENDOSCOPY; Service: Cardiovascular; Laterality: N/A;  . TRIGGER FINGER RELEASE Right    middle finger  . TRIGGER FINGER RELEASE Left 06/28/2016   Procedure: RELEASE TRIGGER FINGER LEFT 3RD FINGER; Surgeon: [name], MD; Location: [facility]; Service: Orthopedics; Laterality: Left;  . TRIGGER FINGER RELEASE Right 06/12/2016   Procedure: RIGHT INDEX FINGER TRIGGER RELEASE; Surgeon: [name], MD; Location: [facility]; Service: Orthopedics; Laterality: Right;  . TRIGGER FINGER RELEASE Right 01/19/2018   Procedure: RIGHT RING  FINGER TRIGGER FINGER RELEASE; Surgeon: [name], MD; Location: [facility]; Service: Orthopedics; Laterality: Right;         Current Outpatient Medications  Medication Sig Dispense Refill  . ACCU-CHEK SOFTCLIX LANCETS lancets TEST 4 TIMES DAILY 200 each 6  . acetaminophen (TYLENOL) 500 MG tablet Take 1,000 mg by mouth 2 (two) times a day.     . albuterol (PROAIR HFA) 108 (90 Base) MCG/ACT inhaler INHALE 2 PUFFS INTO THE LUNGS EVERY 6 (SIX) HOURS AS NEEDED FOR SHORTNESS OF BREATH. 8 g 0  . ammonium lactate (LAC-HYDRIN) 12 % lotion APPLY 1 APPLICATION TOPICALLY 2 (TWO) TIMES DAILY AS NEEDED FOR DRY SKIN. 400 mL 3  . atorvastatin (LIPITOR) 40 MG tablet TAKE 1 TABLET BY MOUTH EVERY DAY 90 tablet 3  . b complex vitamins capsule Take 1 capsule by mouth daily.    . baclofen (LIORESAL) 10 MG tablet Take 1 tablet (10 mg total) by mouth 3 (three) times daily  as needed for muscle spasms. (Patient taking differently: Take 10 mg by mouth 3 (three) times daily. ) 90 each 0  . blood glucose meter kit and supplies KIT Dispense based on patient and insurance preference. Use up to four times daily as directed. (FOR ICD-9 250.00, 250.01). 1 each 0  . busPIRone (BUSPAR) 10 MG tablet TAKE 1 TABLET BY MOUTH THREE TIMES A DAY (Patient taking differently: Take 10 mg by mouth 3 (three) times daily. ) 270 tablet 4  . Calcium Citrate-Vitamin D (CALCIUM CITRATE + D3 PO) Take 1 tablet by mouth daily.    . Capsaicin-Menthol (PAIN RELIEF EX) Apply 1 application topically 4 (four) times daily as needed (shoulder pain).     . CHANTIX 0.5 MG tablet TAKE 1 TABLET BY MOUTH 2 TIMES DAILY. 60 tablet 0  . diclofenac Sodium (VOLTAREN) 1 % GEL Apply 2 g topically 4 (four) times daily. 500 g 1  . diltiazem (CARDIZEM CD) 120 MG 24 hr capsule Take 1 capsule (120 mg total) by mouth 2 (two) times daily. 180 capsule 3  . docusate sodium (COLACE) 100 MG capsule Take 200 mg by mouth 2 (two) times daily.    . empagliflozin (JARDIANCE) 25 MG TABS  tablet Take 25 mg by mouth daily.    . febuxostat (ULORIC) 40 MG tablet Take 40 mg by mouth at bedtime.     . Flaxseed, Linseed, (FLAXSEED OIL) 1000 MG CAPS Take 1,000 mg by mouth daily.    . fluticasone (FLONASE) 50 MCG/ACT nasal spray Place 2 sprays into both nostrils 2 (two) times daily. 16 g 6  . Fluticasone-Umeclidin-Vilant (TRELEGY ELLIPTA) 100-62.5-25 MCG/INH AEPB Inhale 1 puff into the lungs daily. 1 each 0  . folic acid (FOLVITE) 314 MCG tablet Take 400 mcg by mouth daily.    . furosemide (LASIX) 40 MG tablet Take 2 tablets (80 mg total) by mouth 2 (two) times daily. 180 tablet 3  . gabapentin (NEURONTIN) 300 MG capsule TAKE 2 CAPSULES (600 MG TOTAL) BY MOUTH 3 (THREE) TIMES DAILY. 540 capsule 2  . glucose blood (ACCU-CHEK AVIVA PLUS) test strip TEST 4 TIMES DAILY (Patient taking differently: 1 each by Other route 3 (three) times daily. TEST 3 TIMES DAILY) 100 each 12  . HYDROcodone-acetaminophen (NORCO) 7.5-325 MG tablet Take 1 tablet by mouth every 6 (six) hours as needed for moderate pain. 20 tablet 0  . hydrocortisone-pramoxine (PROCTOFOAM-HC) rectal foam Place 1 applicator rectally 2 (two) times daily. 10 g 0  . hydrOXYzine (ATARAX/VISTARIL) 10 MG tablet TAKE 1 TABLET BY MOUTH EVERY DAY AS NEEDED (Patient taking differently: Take 10 mg by mouth daily. ) 90 tablet 1  . Insulin Glargine (LANTUS SOLOSTAR) 100 UNIT/ML Solostar Pen Inject 22 Units into the skin daily before breakfast. (Patient taking differently: Inject 20 Units into the skin daily before breakfast. ) 15 mL   . insulin lispro (HUMALOG KWIKPEN) 100 UNIT/ML KwikPen Inject 0.08 mLs (8 Units total) into the skin 3 (three) times daily before meals.    . Insulin Pen Needle (B-D UF III MINI PEN NEEDLES) 31G X 5 MM MISC CHECK SUGARS 4 TIMES A DAY BEFORE MEALS AND AT BEDTIME. 100 each 12  . ketoconazole (NIZORAL) 2 % cream APPLY 1 FINGERTIP AMOUNT TO EACH FOOT DAILY. (Patient taking differently: Apply 1 application topically 2 (two)  times daily. ) 30 g 0  . loratadine (CLARITIN) 10 MG tablet Take 1 tablet (10 mg total) by mouth daily. 90 tablet 0  . losartan (COZAAR)  100 MG tablet Take 1 tablet (100 mg total) by mouth daily. 90 tablet 3  . metolazone (ZAROXOLYN) 2.5 MG tablet Take 1 tablet (2.5 mg total) by mouth 2 (two) times a week. TAKE 1 TABLET TWICE WEEKLY ON MONDAYS AND THURSDAYS ONLY 15 tablet 6  . montelukast (SINGULAIR) 10 MG tablet TAKE 1 TABLET BY MOUTH EVERYDAY AT BEDTIME (Patient taking differently: Take 10 mg by mouth at bedtime. ) 90 tablet 3  . Multiple Vitamins-Minerals (MULTIVITAMIN WITH MINERALS) tablet Take 1 tablet by mouth daily.    . mupirocin ointment (BACTROBAN) 2 % APPLY TO AFFECTED AREA TWICE A DAY (Patient taking differently: Apply 1 application topically 2 (two) times daily as needed (irritation). ) 22 g 0  . NARCAN 4 MG/0.1ML LIQD nasal spray kit Place 1 spray into the nose once.     . nicotine polacrilex ([employer] NICOTINE POLACRILEX) 4 MG gum Take 1 each (4 mg total) by mouth as needed for smoking cessation. 100 tablet 2  . NON FORMULARY Uses a C-PAP at bedtime    . nystatin (MYCOSTATIN) 100000 UNIT/ML suspension Take 5 mLs (500,000 Units total) by mouth 4 (four) times daily. Swish and swallow. (Patient taking differently: Take 5 mLs by mouth daily as needed (thrush). Swish and swallow.) 60 mL 3  . Omega-3 Fatty Acids (FISH OIL) 1000 MG CAPS Take 1,000 mg by mouth daily.     [name] Kitchen omeprazole (PRILOSEC) 20 MG capsule Take 1 capsule (20 mg total) by mouth daily. 90 capsule 3  . phenazopyridine (PYRIDIUM) 200 MG tablet Take 1 tablet (200 mg total) by mouth 3 (three) times daily as needed (for pain with urination). 30 tablet 0  . potassium chloride SA (KLOR-CON M20) 20 MEQ tablet Take 1 tablet (20 mEq total) by mouth 2 (two) times a week. Take 1 tablet twice weekly on Monday and Thursday. 15 tablet 6  . promethazine (PHENERGAN) 25 MG tablet Take 1 tablet (25 mg total) by mouth every 6 (six) hours as needed for  nausea. 30 tablet 1  . Semaglutide,0.25 or 0.5MG/DOS, (OZEMPIC, 0.25 OR 0.5 MG/DOSE,) 2 MG/1.5ML SOPN Inject 0.25 mg into the skin once a week. Inject 0.25 mg into the skin once weekly on Thursdays. (minus 2 clicks)    . sertraline (ZOLOFT) 25 MG tablet Take 1 tablet (25 mg total) by mouth daily. 30 tablet 0  . sertraline (ZOLOFT) 50 MG tablet TAKE 1 TABLET BY MOUTH EVERY DAY (Patient taking differently: Take 50 mg by mouth daily. ) 90 tablet 1  . TOVIAZ 8 MG TB24 tablet Take 8 mg by mouth at bedtime.      No current facility-administered medications for this visit.            Facility-Administered Medications Ordered in Other Visits  Medication Dose Route Frequency Provider Last Rate Last Admin  . technetium tetrofosmin (TC-MYOVIEW) injection 81.8 millicurie 29.9 millicurie Intravenous Once PRN Sueanne Margarita, MD          Allergies as of 02/16/2019 - Review Complete 02/12/2019  Allergen Reaction Noted  . Aspirin Other (See Comments) 01/27/2018  . Chantix [varenicline tartrate] Other (See Comments) 09/01/2017  . Methadone hcl Other (See Comments) 08/19/2006  . Corticosteroids Other (See Comments) 01/16/2018  . Levofloxacin Itching 08/20/2007        Family History  Problem Relation Age of Onset  . Pulmonary embolism Mother   . Stroke Father   . Alcohol abuse Father   . Pneumonia Father   . Cancer Maternal  Aunt    breast  . Breast cancer Maternal Aunt   . Asthma Other   . COPD Other   . Hypertension Other   . Diabetes Other   . Asthma Sister   . Depression Sister   . Arthritis Sister   . Sickle cell anemia Son    Social History        Socioeconomic History  . Marital status: Single    Spouse name: Not on file  . Number of children: 1  . Years of education: 39  . Highest education level: 12th grade  Occupational History    Comment: Disabled  Tobacco Use  . Smoking status: Current Every Day Smoker    Packs/day: 0.20    Years: 40.00    Pack years: 8.00    Types:  Cigarettes    Start date: 01/28/1977    Last attempt to quit: 10/19/2018    Years since quitting: 0.3  . Smokeless tobacco: Never Used  . Tobacco comment: Quit 8 months 2019 - restart 05/2018 Hx 1.5 PPD  Substance and Sexual Activity  . Alcohol use: No    Alcohol/week: 0.0 standard drinks  . Drug use: Not Currently    Types: Marijuana, "Crack" cocaine    Comment: hx marijuana usemany years ago, Crack -none since 11/2015  . Sexual activity: Not Currently    Partners: Male    Birth control/protection: Post-menopausal  Other Topics Concern  . Not on file  Social History Narrative   Current Social History       Who lives at home: Patient lives alone in one level home; has 4 steps onto porch with a handrail. Has smoke alarms, no throw rugs. Has elevated toilet. Has shower bench in tub with grab rails.    Transportation: Patient has own vehicle and drives herself    Important Relationships "Family and friends"    Pets: None    Likes to eat varied diet. Seafood, fish, roasted [place] breast, vegetables and salads and fruits.   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 I'm a lovable person."         Social Determinants of Health      Financial Resource Strain:   . Difficulty of Paying Living Expenses: Not on file  Food Insecurity:   . Worried About Charity fundraiser in the Last Year: Not on file  . Ran Out of Food in the Last Year: Not on file  Transportation Needs:   . Lack of Transportation (Medical): Not on file  . Lack of Transportation (Non-Medical): Not on file  Physical Activity:   . Days of Exercise per Week: Not on file  . Minutes of Exercise per Session: Not on file  Stress:   . Feeling of Stress : Not on file  Social  Connections:   . Frequency of Communication with Friends and Family: Not on file  . Frequency of Social Gatherings with Friends and Family: Not on file  . Attends Religious Services: Not on file  . Active Member of Clubs or Organizations: Not on file  . Attends Archivist Meetings: Not on file  . Marital Status: Not on  file  Intimate Partner Violence:   . Fear of Current or Ex-Partner: Not on file  . Emotionally Abused: Not on file  . Physically Abused: Not on file  . Sexually Abused: Not on file   Review of Systems:  12 system ROS is negative except as noted above with the additions of allergies, anxiety, arthritis, depression, shortness of breath, and excessive thirst.   Physical Exam:  General: Alert, well-nourished, pleasant and cooperative in NAD  Head: Normocephalic and atraumatic.  Eyes: Sclera clear, no icterus. Conjunctiva pink.  Ears: Normal auditory acuity.  Nose: No deformity, discharge, or lesions.  Abdomen: Morbid obesity. Soft, nontender, nondistended, normal bowel sounds, no rebound or guarding. No hepatosplenomegaly.  Msk: Symmetrical. No obvious boney deformities  LAD: No inguinal or umbilical LAD  Extremities: No clubbing or edema.  Neurologic: Alert and oriented x4; grossly nonfocal  Skin: No rash or bruise.  Psych: Alert and cooperative. Normal mood and affect.

## 2019-03-09 NOTE — Anesthesia Preprocedure Evaluation (Addendum)
Anesthesia Evaluation  Patient identified by MRN, date of birth, ID band Patient awake    Reviewed: Allergy & Precautions, NPO status , Patient's Chart, lab work & pertinent test results  Airway Mallampati: III  TM Distance: >3 FB Neck ROM: Limited    Dental no notable dental hx.    Pulmonary asthma , sleep apnea and Continuous Positive Airway Pressure Ventilation , Current Smoker and Patient abstained from smoking.,    Pulmonary exam normal breath sounds clear to auscultation       Cardiovascular hypertension, Normal cardiovascular exam+ dysrhythmias  Rhythm:Regular Rate:Normal  Left ventricle: The cavity size was normal. Wall thickness was  normal. Systolic function was normal. The estimated ejection  fraction was in the range of 55% to 60%. Wall motion was normal;  there were no regional wall motion abnormalities. Doppler  parameters are consistent with abnormal left ventricular  relaxation (grade 1 diastolic dysfunction).    Neuro/Psych negative neurological ROS  negative psych ROS   GI/Hepatic negative GI ROS,   Endo/Other  diabetesMorbid obesity  Renal/GU Renal InsufficiencyRenal disease  negative genitourinary   Musculoskeletal negative musculoskeletal ROS (+)   Abdominal (+) + obese,   Peds negative pediatric ROS (+)  Hematology negative hematology ROS (+)   Anesthesia Other Findings   Reproductive/Obstetrics negative OB ROS                            Anesthesia Physical Anesthesia Plan  ASA: IV  Anesthesia Plan: MAC   Post-op Pain Management:    Induction: Intravenous  PONV Risk Score and Plan: 0  Airway Management Planned: Simple Face Mask  Additional Equipment:   Intra-op Plan:   Post-operative Plan:   Informed Consent: I have reviewed the patients History and Physical, chart, labs and discussed the procedure including the risks, benefits and  alternatives for the proposed anesthesia with the patient or authorized representative who has indicated his/her understanding and acceptance.     Dental advisory given  Plan Discussed with: CRNA and Surgeon  Anesthesia Plan Comments:         Anesthesia Quick Evaluation

## 2019-03-09 NOTE — Anesthesia Procedure Notes (Signed)
Procedure Name: MAC Date/Time: 03/09/2019 12:46 PM Performed by: Deliah Boston, CRNA Pre-anesthesia Checklist: Patient identified, Emergency Drugs available, Suction available and Patient being monitored Patient Re-evaluated:Patient Re-evaluated prior to induction Oxygen Delivery Method: Simple face mask Preoxygenation: Pre-oxygenation with 100% oxygen Induction Type: IV induction Number of attempts: 1 Placement Confirmation: positive ETCO2

## 2019-03-10 ENCOUNTER — Telehealth: Payer: Self-pay | Admitting: Gastroenterology

## 2019-03-10 ENCOUNTER — Other Ambulatory Visit: Payer: Self-pay | Admitting: *Deleted

## 2019-03-10 DIAGNOSIS — K6289 Other specified diseases of anus and rectum: Secondary | ICD-10-CM

## 2019-03-10 LAB — SURGICAL PATHOLOGY

## 2019-03-10 NOTE — Telephone Encounter (Signed)
Pt would like to schedule manometry. Pls call her.

## 2019-03-10 NOTE — Telephone Encounter (Signed)
Patient scheduled for the following:   03/23/19 at 9:35 am covid screening  03/26/19 at 10:30 am anorectal mano (Case ID: [id number])  Follow up moved to 04/16/19 at 10:30 am with Dr. Tarri Glenn

## 2019-03-11 ENCOUNTER — Encounter: Payer: Self-pay | Admitting: *Deleted

## 2019-03-11 NOTE — Telephone Encounter (Signed)
Spoke to patient who is aware of all appointments and instructions concerning anorectal manometry.

## 2019-03-12 ENCOUNTER — Other Ambulatory Visit: Payer: Self-pay | Admitting: Family Medicine

## 2019-03-12 DIAGNOSIS — Z87891 Personal history of nicotine dependence: Secondary | ICD-10-CM

## 2019-03-15 DIAGNOSIS — G4733 Obstructive sleep apnea (adult) (pediatric): Secondary | ICD-10-CM | POA: Diagnosis not present

## 2019-03-15 DIAGNOSIS — M5090 Cervical disc disorder, unspecified, unspecified cervical region: Secondary | ICD-10-CM | POA: Diagnosis not present

## 2019-03-18 ENCOUNTER — Ambulatory Visit: Payer: Medicare Other | Admitting: Gastroenterology

## 2019-03-21 ENCOUNTER — Other Ambulatory Visit: Payer: Self-pay | Admitting: Family Medicine

## 2019-03-22 ENCOUNTER — Encounter: Payer: Self-pay | Admitting: Orthopaedic Surgery

## 2019-03-22 ENCOUNTER — Encounter: Payer: Self-pay | Admitting: Family Medicine

## 2019-03-22 ENCOUNTER — Ambulatory Visit (INDEPENDENT_AMBULATORY_CARE_PROVIDER_SITE_OTHER): Payer: Medicare Other | Admitting: Family Medicine

## 2019-03-22 ENCOUNTER — Other Ambulatory Visit: Payer: Self-pay

## 2019-03-22 VITALS — BP 122/70 | HR 94 | Wt 371.2 lb

## 2019-03-22 DIAGNOSIS — Z794 Long term (current) use of insulin: Secondary | ICD-10-CM

## 2019-03-22 DIAGNOSIS — E1122 Type 2 diabetes mellitus with diabetic chronic kidney disease: Secondary | ICD-10-CM

## 2019-03-22 DIAGNOSIS — K6289 Other specified diseases of anus and rectum: Secondary | ICD-10-CM | POA: Diagnosis not present

## 2019-03-22 DIAGNOSIS — N183 Chronic kidney disease, stage 3 unspecified: Secondary | ICD-10-CM | POA: Diagnosis not present

## 2019-03-22 LAB — POCT GLYCOSYLATED HEMOGLOBIN (HGB A1C): HbA1c, POC (controlled diabetic range): 7.4 % — AB (ref 0.0–7.0)

## 2019-03-22 MED ORDER — ALBUTEROL SULFATE HFA 108 (90 BASE) MCG/ACT IN AERS: 2.0000 | INHALATION_SPRAY | Freq: Four times a day (QID) | RESPIRATORY_TRACT | 3 refills | Status: DC | PRN

## 2019-03-22 MED ORDER — DULOXETINE HCL 30 MG PO CPEP: 60.0000 mg | ORAL_CAPSULE | Freq: Every day | ORAL | 0 refills | Status: DC

## 2019-03-22 NOTE — Progress Notes (Signed)
    SUBJECTIVE:   CHIEF COMPLAINT / HPI:  Patient presents for hemoglobin A1c check.  Diabetes Patient reports that she has been working on her diabetes so that she can have her shoulder surgery.  Her hemoglobin A1c at last check was 8.5.  She reports her fasting blood sugars have ranged in the 110s to 120's.  The highest she has seen was on one occasion and it was 280 but patient reports she checked her blood sugar at the wrong time and it was soon after eating a meal.  It was a one-time occurrence.  Patient denies any low blood sugars with her lowest being 89.  No low blood sugar symptoms noted.  Patient does report that her lower extremity numbness she feels is getting worse since she started having issues with her rectum.  Patient denies any recent foot injuries and said that she just had her nails trimmed.  Rectal issues Patient is still complaining of feeling of fullness in her rectum.  She was seen by gastroenterology earlier this month and had a colonoscopy which found several polyps.  2 of them were precancerous and the gastroenterologist recommended follow-up colonoscopy in 3 years.  Given patient is still having issues with symptoms to gastroenterologist also recommended anorectal manometry.  That is scheduled for February 26.  Patient reports muscle spasms on the left side of her rectum but not on the right.  Patient complains of issues with incontinence she says that she is unable to hold it in.  PERTINENT  PMH / PSH: Type 2 diabetes  OBJECTIVE:   BP 122/70   Pulse 94   Wt (!) 371 lb 3.2 oz (168.4 kg)   LMP 04/10/2006   SpO2 93%   BMI 58.14 kg/m   General: Patient sitting comfortably in exam room.  In no acute distress Respiratory: Normal work of breathing, clear to auscultation bilaterally Cardio: Regular rate and rhythm, no murmurs appreciated Abdominal: Normal bowel sounds, obese abdomen Diabetic Foot Exam - Simple   Simple Foot Form Visual Inspection No deformities, no  ulcerations, no other skin breakdown bilaterally: Yes Sensation Testing Intact to touch and monofilament testing bilaterally: Yes Pulse Check Posterior Tibialis and Dorsalis pulse intact bilaterally: Yes Comments Patient reports "more ashiness" on her right foot.  It is noted but patient sensation still intact.  No open sores or ulcers noted.     ASSESSMENT/PLAN:   Type 2 diabetes mellitus with stage 3 chronic kidney disease, with long-term current use of insulin (HCC) Longstanding history of diabetes.  Patient reports well-controlled blood glucoses with one elevated blood glucose which she reports was because she took it at the wrong time.  The majority of her fasting blood glucoses have been in the 110s to 120s.  Hemoglobin A1c today was 7.4 down from 8.5. -Continue on Lantus 20 units daily in the morning -Humalog 8 units 3 times daily before meals -Ozempic 3.87 mg minus clicks once weekly -Counseled on symptoms and management of hypoglycemia -Follow-up diabetes check in 3 months  Rectal discomfort Reporting continuation of symptoms of rectal fullness.  Patient reports that she saw GI who did colonoscopy and found 3 polyps.  2 were hyperplastic and they recommended follow-up colonoscopy in 3 years.  Patient does note issues with incontinence because she "feels like I cannot hold it". -Follow-up colonoscopy in 3 years -Patient scheduled for anorectal manometry on March 26, 2019    [name], MD [place]

## 2019-03-22 NOTE — Telephone Encounter (Signed)
Please schedule for surgery.  I think I've already filled out a sheet.  Let's change it to beach chair though.  Thanks.

## 2019-03-22 NOTE — Assessment & Plan Note (Signed)
Reporting continuation of symptoms of rectal fullness.  Patient reports that she saw GI who did colonoscopy and found 3 polyps.  2 were hyperplastic and they recommended follow-up colonoscopy in 3 years.  Patient does note issues with incontinence because she "feels like I cannot hold it". -Follow-up colonoscopy in 3 years -Patient scheduled for anorectal manometry on March 26, 2019

## 2019-03-22 NOTE — Assessment & Plan Note (Signed)
Longstanding history of diabetes.  Patient reports well-controlled blood glucoses with one elevated blood glucose which she reports was because she took it at the wrong time.  The majority of her fasting blood glucoses have been in the 110s to 120s.  Hemoglobin A1c today was 7.4 down from 8.5. -Continue on Lantus 20 units daily in the morning -Humalog 8 units 3 times daily before meals -Ozempic 5.79 mg minus clicks once weekly -Counseled on symptoms and management of hypoglycemia -Follow-up diabetes check in 3 months

## 2019-03-22 NOTE — Patient Instructions (Addendum)
It was a pleasure to see you this morning.  Congratulations on your good work regarding your A1c!  Keep it up!  I want you to continue on your diabetes medications as prescribed.  Regarding your rectal issues I am glad you are getting that taken care of by nursing the GI specialist for that.  I will see the results for your anorectal manometry after that is done on the 26.  If you need anything or have any further questions please call the clinic and we can get an appointment scheduled.  I hope you have a wonderful day!

## 2019-03-23 ENCOUNTER — Telehealth: Payer: Self-pay | Admitting: Family Medicine

## 2019-03-23 ENCOUNTER — Other Ambulatory Visit (HOSPITAL_COMMUNITY)
Admission: RE | Admit: 2019-03-23 | Discharge: 2019-03-23 | Disposition: A | Discharge status: Home / Self Care | Payer: Medicare Other | Source: Ambulatory Visit | Attending: Gastroenterology | Admitting: Gastroenterology

## 2019-03-23 DIAGNOSIS — Z01812 Encounter for preprocedural laboratory examination: Secondary | ICD-10-CM | POA: Insufficient documentation

## 2019-03-23 DIAGNOSIS — Z20822 Contact with and (suspected) exposure to covid-19: Secondary | ICD-10-CM | POA: Diagnosis not present

## 2019-03-23 LAB — SARS CORONAVIRUS 2 (TAT 6-24 HRS): SARS Coronavirus 2: NEGATIVE

## 2019-03-23 MED ORDER — VARENICLINE TARTRATE 0.5 MG PO TABS: 0.5000 mg | ORAL_TABLET | Freq: Two times a day (BID) | ORAL | 5 refills | Status: DC

## 2019-03-23 NOTE — Telephone Encounter (Signed)
Pt is dropping off Medical release form that has a portion that Dr. Caron Presume needs to complete. LDOS: 03/22/19.  I have placed the form in white folder.   Pt would like form to be faxed to [employer] when completed.

## 2019-03-23 NOTE — Telephone Encounter (Signed)
Patient returned to clinic on 2/23 ~11:00 AM   Brought Duloxetine 30mg   #59 for destruction.  Taken from patient and sent for destruction.    Patient expressed interest in restarting varenicline to assist with tobacco cessation. This has helped in the past and I agreed to send in refill.

## 2019-03-24 ENCOUNTER — Encounter: Payer: Self-pay | Admitting: Family Medicine

## 2019-03-24 ENCOUNTER — Telehealth: Payer: Self-pay | Admitting: Orthopaedic Surgery

## 2019-03-24 NOTE — Telephone Encounter (Signed)
Pt called in returning the missed call from lis earlier today.   [phone]

## 2019-03-24 NOTE — Telephone Encounter (Signed)
Sherrie was the one that called. She already spoke to patient.

## 2019-03-24 NOTE — Telephone Encounter (Signed)
Patient called.  She would like a call back to discuss getting surgery on her right shoulder.  Call back number: [phone]

## 2019-03-26 ENCOUNTER — Other Ambulatory Visit: Payer: Self-pay | Admitting: Family Medicine

## 2019-03-26 ENCOUNTER — Ambulatory Visit (HOSPITAL_COMMUNITY)
Admission: RE | Admit: 2019-03-26 | Discharge: 2019-03-26 | Disposition: A | Discharge status: Home / Self Care | Payer: Medicare Other | Attending: Gastroenterology | Admitting: Gastroenterology

## 2019-03-26 ENCOUNTER — Encounter (HOSPITAL_COMMUNITY)
Admission: RE | Disposition: A | Discharge status: Home / Self Care | Payer: Self-pay | Source: Home / Self Care | Attending: Gastroenterology

## 2019-03-26 DIAGNOSIS — K5900 Constipation, unspecified: Secondary | ICD-10-CM | POA: Diagnosis not present

## 2019-03-26 DIAGNOSIS — K5902 Outlet dysfunction constipation: Secondary | ICD-10-CM | POA: Diagnosis not present

## 2019-03-26 DIAGNOSIS — K6289 Other specified diseases of anus and rectum: Secondary | ICD-10-CM | POA: Insufficient documentation

## 2019-03-26 HISTORY — PX: ANAL RECTAL MANOMETRY: SHX6358

## 2019-03-26 SURGERY — MANOMETRY, ANORECTAL

## 2019-03-26 NOTE — Progress Notes (Signed)
Anal rectal manometry performed per protocol without complications.  Patient tolerated well.  Balloon expulsion test performed per protocol without complication.  Patient unable to expel balloon after 3 minutes.  Balloon deflated and removed.  Patient tolerated well.

## 2019-03-28 ENCOUNTER — Encounter: Payer: Self-pay | Admitting: *Deleted

## 2019-03-30 DIAGNOSIS — M109 Gout, unspecified: Secondary | ICD-10-CM | POA: Diagnosis not present

## 2019-03-30 DIAGNOSIS — M5136 Other intervertebral disc degeneration, lumbar region: Secondary | ICD-10-CM | POA: Diagnosis not present

## 2019-03-30 DIAGNOSIS — N183 Chronic kidney disease, stage 3 unspecified: Secondary | ICD-10-CM | POA: Diagnosis not present

## 2019-03-30 DIAGNOSIS — Z79899 Other long term (current) drug therapy: Secondary | ICD-10-CM | POA: Diagnosis not present

## 2019-03-30 DIAGNOSIS — E559 Vitamin D deficiency, unspecified: Secondary | ICD-10-CM | POA: Diagnosis not present

## 2019-03-30 DIAGNOSIS — G894 Chronic pain syndrome: Secondary | ICD-10-CM | POA: Diagnosis not present

## 2019-03-30 NOTE — Telephone Encounter (Signed)
Checking to see if you have this form, it was not in our folder. Rashon Rezek Zimmerman Rumple, CMA

## 2019-03-31 NOTE — Progress Notes (Addendum)
[id number] [place], [place] [address] [place] [address] Phone: [phone] Fax: [phone]   Your procedure is scheduled on Monday, March 8th.  Report to [facility] Main Entrance "A" at 5:30 A.M., and check in at the Admitting office.  Call this number if you have problems the morning of surgery:  [phone]  Call [phone] if you have any questions prior to your surgery date Monday-Friday 8am-4pm   Remember:  Do not eat after midnight the night before your surgery  You may drink clear liquids until 4:15 A.M.the morning of your surgery.   Clear liquids allowed are: Water, Non-Citrus Juices (without pulp), Carbonated Beverages, Clear Tea, Black Coffee Only, and Gatorade   Enhanced Recovery after Surgery for Orthopedics Enhanced Recovery after Surgery is a protocol used to improve the stress on your body and your recovery after surgery.  Patient Instructions  . The night before surgery:  o No food after midnight. ONLY clear liquids after midnight  . The day of surgery (if you have diabetes):  o Drink ONE (1) 10oz bottle of water as directed. o This drink was given to you during your hospital  pre-op appointment visit.  Drink (1) 10oz bottle of water by 4:15 A.M. the morning of surgery DO NOT SIP. Drink in one setting. o Nothing else to drink after completing the  Gatorade 2 (G2).         If you have questions, please contact your surgeon's office.   Take these medicines the morning of surgery with A SIP OF WATER  acetaminophen (TYLENOL) atorvastatin (LIPITOR) baclofen (LIORESAL)  busPIRone (BUSPAR)  diltiazem (CARDIZEM CD)  docusate sodium (COLACE) fluticasone (FLONASE)/nasal spray Fluticasone-Umeclidin-Vilant (TRELEGY ELLIPTA)/ inhaler gabapentin (NEURONTIN) loratadine (CLARITIN)  omeprazole (PRILOSEC)  sertraline (ZOLOFT)  varenicline (CHANTIX)   If needed - albuterol  (VENTOLIN HFA)/inhaler, HYDROcodone-acetaminophen (NORCO), hydrOXYzine (ATARAX/VISTARIL)  As of today, STOP taking any Aspirin (unless otherwise instructed by your surgeon), diclofenac Sodium (VOLTAREN, )Aleve, Naproxen, Ibuprofen, Motrin, Advil, Goody's, BC's, all herbal medications, fish oil, and all vitamins.  WHAT DO I DO ABOUT MY DIABETES MEDICATION?  Do not take empagliflozin (JARDIANCE) the day before surgery   . Do not take empagliflozin (JARDIANCE)/oral diabetes medicines (pills) the morning of surgery.  . Do not take Semaglutide,0.25 or 0.5MG /DOS, ([place]) the morning of surgery.  . THE MORNING OF SURGERY, take 10 units of Insulin Glargine (LANTUS SOLOSTAR).  . The day of surgery, do not take other diabetes injectables, including Byetta (exenatide), Bydureon (exenatide ER), Victoza (liraglutide), or Trulicity (dulaglutide).   HOW TO MANAGE YOUR DIABETES BEFORE AND AFTER SURGERY  Why is it important to control my blood sugar before and after surgery? . Improving blood sugar levels before and after surgery helps healing and can limit problems. . A way of improving blood sugar control is eating a healthy diet by: o  Eating less sugar and carbohydrates o  Increasing activity/exercise o  Talking with your doctor about reaching your blood sugar goals . High blood sugars (greater than 180 mg/dL) can raise your risk of infections and slow your recovery, so you will need to focus on controlling your diabetes during the weeks before surgery. . Make sure that the doctor who takes care of your diabetes knows about your planned surgery including the date and location.  How do I manage my blood sugar before surgery? . Check your blood sugar at least 4 times a day, starting 2  days before surgery, to make sure that the level is not too high or low. . Check your blood sugar the morning of your surgery when you wake up and every 2 hours until you get to the Short Stay unit. o If your blood  sugar is less than 70 mg/dL, you will need to treat for low blood sugar: - Do not take insulin. - Treat a low blood sugar (less than 70 mg/dL) with  cup of clear juice (cranberry or apple), 4 glucose tablets, OR glucose gel. - Recheck blood sugar in 15 minutes after treatment (to make sure it is greater than 70 mg/dL). If your blood sugar is not greater than 70 mg/dL on recheck, call [phone] for further instructions. . Report your blood sugar to the short stay nurse when you get to Short Stay.  . If you are admitted to the hospital after surgery: o Your blood sugar will be checked by the staff and you will probably be given insulin after surgery (instead of oral diabetes medicines) to make sure you have good blood sugar levels. o The goal for blood sugar control after surgery is 80-180 mg/dL.  The Morning of Surgery  Do not wear jewelry, make-up or nail polish.  Do not wear lotions, powders, perfumes or deodorant  Do not shave 48 hours prior to surgery.    Do not bring valuables to the hospital.  [facility] is not responsible for any belongings or valuables.  If you are a smoker, DO NOT Smoke 24 hours prior to surgery  If you wear a CPAP at night please bring your mask the morning of surgery   Remember that you must have someone to transport you home after your surgery, and remain with you for 24 hours if you are discharged the same day.  Please bring cases for contacts, glasses, hearing aids, dentures or bridgework because it cannot be worn into surgery.   Leave your suitcase in the car.  After surgery it may be brought to your room.  For patients admitted to the hospital, discharge time will be determined by your treatment team.  Patients discharged the day of surgery will not be allowed to drive home.   Special instructions:   [facility]- Preparing For Surgery  Before surgery, you can play an important role. Because skin is not sterile, your skin needs to be as free of  germs as possible. You can reduce the number of germs on your skin by washing with CHG (chlorahexidine gluconate) Soap before surgery.  CHG is an antiseptic cleaner which kills germs and bonds with the skin to continue killing germs even after washing.    Oral Hygiene is also important to reduce your risk of infection.  Remember - BRUSH YOUR TEETH THE MORNING OF SURGERY WITH YOUR REGULAR TOOTHPASTE  Please do not use if you have an allergy to CHG or antibacterial soaps. If your skin becomes reddened/irritated stop using the CHG.  Do not shave (including legs and underarms) for at least 48 hours prior to first CHG shower. It is OK to shave your face.  Please follow these instructions carefully.   1. Shower the NIGHT BEFORE SURGERY and the MORNING OF SURGERY with CHG Soap.   2. If you chose to wash your hair, wash your hair first as usual with your normal shampoo.  3. After you shampoo, rinse your hair and body thoroughly to remove the shampoo.  4. Use CHG as you would any other liquid soap. You  can apply CHG directly to the skin and wash gently with a scrungie or a clean washcloth.   5. Apply the CHG Soap to your body ONLY FROM THE NECK DOWN.  Do not use on open wounds or open sores. Avoid contact with your eyes, ears, mouth and genitals (private parts). Wash Face and genitals (private parts)  with your normal soap.   6. Wash thoroughly, paying special attention to the area where your surgery will be performed.  7. Thoroughly rinse your body with warm water from the neck down.  8. DO NOT shower/wash with your normal soap after using and rinsing off the CHG Soap.  9. Pat yourself dry with a CLEAN TOWEL.  10. Wear CLEAN PAJAMAS to bed the night before surgery, wear comfortable clothes the morning of surgery  11. Place CLEAN SHEETS on your bed the night of your first shower and DO NOT SLEEP WITH PETS.  Day of Surgery: Please shower the morning of surgery with the CHG soap Do not apply  any deodorants/lotions. Please wear clean clothes to the hospital/surgery center.   Remember to brush your teeth WITH YOUR REGULAR TOOTHPASTE.  Please read over the following fact sheets that you were given.

## 2019-04-01 ENCOUNTER — Other Ambulatory Visit: Payer: Self-pay

## 2019-04-01 ENCOUNTER — Encounter (HOSPITAL_COMMUNITY): Payer: Self-pay

## 2019-04-01 ENCOUNTER — Other Ambulatory Visit (HOSPITAL_COMMUNITY)
Admission: RE | Admit: 2019-04-01 | Discharge: 2019-04-01 | Disposition: A | Discharge status: Home / Self Care | Payer: Medicare Other | Source: Ambulatory Visit | Attending: Orthopaedic Surgery | Admitting: Orthopaedic Surgery

## 2019-04-01 ENCOUNTER — Encounter (HOSPITAL_COMMUNITY)
Admission: RE | Admit: 2019-04-01 | Discharge: 2019-04-01 | Disposition: A | Discharge status: Home / Self Care | Payer: Medicare Other | Source: Ambulatory Visit | Attending: Orthopaedic Surgery | Admitting: Orthopaedic Surgery

## 2019-04-01 ENCOUNTER — Encounter: Payer: Self-pay | Admitting: Orthopaedic Surgery

## 2019-04-01 DIAGNOSIS — Z20822 Contact with and (suspected) exposure to covid-19: Secondary | ICD-10-CM | POA: Diagnosis not present

## 2019-04-01 DIAGNOSIS — N183 Chronic kidney disease, stage 3 unspecified: Secondary | ICD-10-CM | POA: Insufficient documentation

## 2019-04-01 DIAGNOSIS — B1910 Unspecified viral hepatitis B without hepatic coma: Secondary | ICD-10-CM | POA: Diagnosis not present

## 2019-04-01 DIAGNOSIS — I130 Hypertensive heart and chronic kidney disease with heart failure and stage 1 through stage 4 chronic kidney disease, or unspecified chronic kidney disease: Secondary | ICD-10-CM | POA: Diagnosis not present

## 2019-04-01 DIAGNOSIS — Z79899 Other long term (current) drug therapy: Secondary | ICD-10-CM | POA: Diagnosis not present

## 2019-04-01 DIAGNOSIS — F319 Bipolar disorder, unspecified: Secondary | ICD-10-CM | POA: Diagnosis not present

## 2019-04-01 DIAGNOSIS — Z794 Long term (current) use of insulin: Secondary | ICD-10-CM | POA: Insufficient documentation

## 2019-04-01 DIAGNOSIS — Z01812 Encounter for preprocedural laboratory examination: Secondary | ICD-10-CM | POA: Diagnosis not present

## 2019-04-01 DIAGNOSIS — M75101 Unspecified rotator cuff tear or rupture of right shoulder, not specified as traumatic: Secondary | ICD-10-CM | POA: Insufficient documentation

## 2019-04-01 DIAGNOSIS — G4733 Obstructive sleep apnea (adult) (pediatric): Secondary | ICD-10-CM | POA: Insufficient documentation

## 2019-04-01 DIAGNOSIS — I447 Left bundle-branch block, unspecified: Secondary | ICD-10-CM | POA: Diagnosis not present

## 2019-04-01 DIAGNOSIS — F419 Anxiety disorder, unspecified: Secondary | ICD-10-CM | POA: Diagnosis not present

## 2019-04-01 DIAGNOSIS — E785 Hyperlipidemia, unspecified: Secondary | ICD-10-CM | POA: Diagnosis not present

## 2019-04-01 DIAGNOSIS — Z7901 Long term (current) use of anticoagulants: Secondary | ICD-10-CM | POA: Diagnosis not present

## 2019-04-01 DIAGNOSIS — F17210 Nicotine dependence, cigarettes, uncomplicated: Secondary | ICD-10-CM | POA: Diagnosis not present

## 2019-04-01 DIAGNOSIS — E1122 Type 2 diabetes mellitus with diabetic chronic kidney disease: Secondary | ICD-10-CM | POA: Insufficient documentation

## 2019-04-01 DIAGNOSIS — I5032 Chronic diastolic (congestive) heart failure: Secondary | ICD-10-CM | POA: Diagnosis not present

## 2019-04-01 LAB — COMPREHENSIVE METABOLIC PANEL
ALT: 39 U/L (ref 0–44)
AST: 40 U/L (ref 15–41)
Albumin: 3.6 g/dL (ref 3.5–5.0)
Alkaline Phosphatase: 75 U/L (ref 38–126)
Anion gap: 13 (ref 5–15)
BUN: 22 mg/dL — ABNORMAL HIGH (ref 6–20)
CO2: 28 mmol/L (ref 22–32)
Calcium: 9.2 mg/dL (ref 8.9–10.3)
Chloride: 101 mmol/L (ref 98–111)
Creatinine, Ser: 1.31 mg/dL — ABNORMAL HIGH (ref 0.44–1.00)
GFR calc Af Amer: 53 mL/min — ABNORMAL LOW (ref >60)
GFR calc non Af Amer: 46 mL/min — ABNORMAL LOW (ref >60)
Glucose, Bld: 105 mg/dL — ABNORMAL HIGH (ref 70–99)
Potassium: 3.5 mmol/L (ref 3.5–5.1)
Sodium: 142 mmol/L (ref 135–145)
Total Bilirubin: 0.5 mg/dL (ref 0.3–1.2)
Total Protein: 6.3 g/dL — ABNORMAL LOW (ref 6.5–8.1)

## 2019-04-01 LAB — GLUCOSE, CAPILLARY: Glucose-Capillary: 122 mg/dL — ABNORMAL HIGH (ref 70–99)

## 2019-04-01 LAB — CBC
HCT: 45.9 % (ref 36.0–46.0)
Hemoglobin: 15.4 g/dL — ABNORMAL HIGH (ref 12.0–15.0)
MCH: 28 pg (ref 26.0–34.0)
MCHC: 33.6 g/dL (ref 30.0–36.0)
MCV: 83.5 fL (ref 80.0–100.0)
Platelets: 319 10*3/uL (ref 150–400)
RBC: 5.5 MIL/uL — ABNORMAL HIGH (ref 3.87–5.11)
RDW: 14.5 % (ref 11.5–15.5)
WBC: 9.1 10*3/uL (ref 4.0–10.5)
nRBC: 0 % (ref 0.0–0.2)

## 2019-04-01 LAB — SARS CORONAVIRUS 2 (TAT 6-24 HRS): SARS Coronavirus 2: NEGATIVE

## 2019-04-01 NOTE — Telephone Encounter (Signed)
She is going home same day

## 2019-04-01 NOTE — Progress Notes (Signed)
PCP - Dr. [name] Cardiologist - Dr. Al Pimple Pulmonologist - Dr. June Leap Nephrology - Dr. Rosita Fire  PPM/ICD - denies  Chest x-ray - N/A EKG - 09/24/2018 Stress Test - 10/28/17 ECHO - 10/01/17 Cardiac Cath - denies   Sleep Study - YES BIPAP - per patient wears every night O2- per patient "wears 24hrs, did not bring into appointment due to oxygen tank being too heavy and hurts shoulders, which is the reason for upcoming shoulder surgery"  Fasting Blood Sugar -  122 - 189 Checks Blood Sugar 3 times a day  Blood Thinner Instructions: N/A Aspirin Instructions: N/A  ERAS Protcol - Yes PRE-SURGERY Ensure or G2- (1) 10oz bottle of water given  COVID TEST- Scheduled for today after PAT appointment. Patient verbalized understanding of self-quarantine instructions, appointment time and place.  Anesthesia review: YES, cardiac hx, abnormal EKG, on 2L O2   Patient denies shortness of breath, fever, cough and chest pain at PAT appointment  All instructions explained to the patient, with a verbal understanding of the material. Patient agrees to go over the instructions while at home for a better understanding. Patient also instructed to self quarantine after being tested for COVID-19. The opportunity to ask questions was provided.

## 2019-04-01 NOTE — Telephone Encounter (Signed)
Looks like she saw nitka for her right trigger thumb 09/2018.  Erlinda Hong, do you want to add to consent?

## 2019-04-02 ENCOUNTER — Encounter (HOSPITAL_COMMUNITY): Payer: Self-pay | Admitting: Vascular Surgery

## 2019-04-02 ENCOUNTER — Telehealth: Payer: Self-pay | Admitting: Cardiology

## 2019-04-02 ENCOUNTER — Other Ambulatory Visit: Payer: Self-pay

## 2019-04-02 DIAGNOSIS — R0789 Other chest pain: Secondary | ICD-10-CM

## 2019-04-02 DIAGNOSIS — R079 Chest pain, unspecified: Secondary | ICD-10-CM

## 2019-04-02 DIAGNOSIS — I5032 Chronic diastolic (congestive) heart failure: Secondary | ICD-10-CM

## 2019-04-02 DIAGNOSIS — I471 Supraventricular tachycardia, unspecified: Secondary | ICD-10-CM

## 2019-04-02 MED ORDER — DEXTROSE 5 % IV SOLN
3.0000 g | INTRAVENOUS | Status: DC
  Filled 2019-04-02: qty 3000

## 2019-04-02 NOTE — Telephone Encounter (Signed)
Ok thanks 

## 2019-04-02 NOTE — Telephone Encounter (Signed)
Patient did not follow through with stress test ordered/scheduled back in December.  She needs to be seen, tested and cleared by cardiology.  Postponing surgery.  When rescheduled, trigger thumb will be a part of the procedure.

## 2019-04-02 NOTE — Telephone Encounter (Signed)
      [facility] Pre-operative Risk Assessment    Request for surgical clearance:  1. What type of surgery is being performed? Shoulder arthroscopy rotator cuff repair    2. When is this surgery scheduled? 04/05/19   3. What type of clearance is required (medical clearance vs. Pharmacy clearance to hold med vs. Both)? medical  4. Are there any medications that need to be held prior to surgery and how long? None    5. Practice name and name of physician performing surgery? Dr. Eduard Roux   6. What is your office phone number [phone]    7.   What is your office fax number [phone]  8.   Anesthesia type (None, local, MAC, general) ? General + block   Angeline S Hammer 04/02/2019, 10:29 AM  _________________________________________________________________   (provider comments below)

## 2019-04-02 NOTE — Progress Notes (Signed)
Anesthesia Chart Review:  Case: [id number] Date/Time: 04/05/19 0700   Procedure: RIGHT SHOULDER ARTHROSCOPY WITH DEBRIDEMENT, ROTATOR CUFF REPAIR, DISTAL CLAVICLE EXCISION AND SUBACROMIAL DECOMPRESSION (Right Shoulder)   Anesthesia type: General   Pre-op diagnosis: right shoulder rotator cuff tear   Location: [facility] OR ROOM 04 / [place] [facility]   Surgeons: [name], MD      DISCUSSION: Patient is a 54 year old female scheduled for the above procedure.  History includes smoking, HLD, asthma, HTN, OSA (Bipap and O2 at night), COPD (2L/[place]), chronic diastolic heart failure, PSVT, incomplete LBBB, DM2, GERD, CKD (stage III), Bipolar disorder, exertional dyspnea, Hepatitis B, polysubstance abuse (history of: no crack since 2017; no current alcohol or marijuana use documented), lung nodule (stable RUL nodule 03/2019, repeat CT planned 02/2020 per Dr. Valeta Harms). BMI is consistent with morbid obesity.  Seen by PCP Cresenzo on 03/22/19 for DM follow-up in preparation for shoulder shoulder. A1c had been 8.5% 3 months ago, but now down to 7.4% and surgery scheduled.    Last evaluation by pulmonlogist Dr. Valeta Harms on 03/03/19. He was aware of possible upcoming shoulder and colonoscopy procedures. She was still smoking, but decreased amount. Using Bipap, 2L O2.   Patient last evaluated by cardiologist Dr. Harrell Gave on 01/06/19. Note indicates need for preoperative evaluation and referenced right shoulder surgery but also ultimate plans for bariatric surgery. She had a low risk stress test 11/11/17, but since stress test > 1 year ago and unable to perform 4 METS at that time, she discussed repeating a 2 day Lexiscan stress test which was scheduled for the end of December, but cancelled by patient due to acute hip pain. It does not appear that test was rescheduled. She did go on to have colonoscopy with propofol on 03/09/19.   She has tolerated cystoscopy and colonoscopy since 12/2018, but given mention of repeat stress test for  upcoming surgeries, I think surgeon will need to clarify with cardiology regarding whether or not testing is needed prior to this surgery. I have notified Sherrie at Dr. Phoebe Sharps office.  04/01/19 presurgical COVID-19 test negative.   VS: BP (!) 141/83   Pulse 87   Temp 36.5 C (Oral)   Resp 20   Ht 5' 10" (1.778 m)   Wt (!) 170.1 kg   LMP 04/10/2006   SpO2 91%   BMI 53.81 kg/m    PROVIDERS: [name], MD is PCP [name], MD is cardiologist. Last evaluation 01/06/19.   June Leap, MD is pulmonologist. Last evaluation 03/04/19.  Lawson Radar, MD is nephrologist   LABS: Labs reviewed: Acceptable for surgery. A1c 7.4% on 03/22/19. (all labs ordered are listed, but only abnormal results are displayed)  Labs Reviewed  GLUCOSE, CAPILLARY - Abnormal; Notable for the following components:      Result Value   Glucose-Capillary 122 (*)    All other components within normal limits  COMPREHENSIVE METABOLIC PANEL - Abnormal; Notable for the following components:   Glucose, Bld 105 (*)    BUN 22 (*)    Creatinine, Ser 1.31 (*)    Total Protein 6.3 (*)    GFR calc non Af Amer 46 (*)    GFR calc Af Amer 53 (*)    All other components within normal limits  CBC - Abnormal; Notable for the following components:   RBC 5.50 (*)    Hemoglobin 15.4 (*)    All other components within normal limits     IMAGES: CT Super D Chest 03/03/19  IMPRESSION: 1. 7 mm right upper lobe nodule is stable from 04/21/2018 but appears minimally enlarged from 10/01/2017. Malignancy cannot be excluded. Additional follow-up in 1 year is recommended. 2. Basilar peripheral ground-glass opacities may be due to expiratory phase imaging. COVID-19 pneumonia can also have this appearance. 3. Hepatic steatosis. 4. Coronary artery calcification. 5. Enlarged pulmonic trunk, indicative of pulmonary arterial hypertension.  MRI left shoulder 12/28/18 IMPRESSION: 1. Tendinosis with high-grade  partial thickness tears of the supraspinatus and subscapularis tendons. 2. Intra-articular biceps tendinosis. 3. Subacromial-subdeltoid bursitis. 4. Mild-moderate acromioclavicular osteoarthritis.  CXR 09/24/18 FINDINGS: Heart size and pulmonary vascularity are normal. No infiltrates or effusions. No acute bone abnormality. IMPRESSION: No active cardiopulmonary disease.   EKG: 09/24/2018 Rate 90 bpm Sinus rhythm  Atrial premature complex Incomplete left bundle branch block  Borderline prolonged QT interval    CV: Long Term Monitor 12/02/17-12/05/17 3 days of rhythm interpreted on Zio patch. Patient had a min HR of 58 bpm, max HR of 193 bpm, and avg HR of 81 bpm. Predominant underlying rhythm was Sinus Rhythm. 217 Supraventricular Tachycardia runs occurred, the run with the fastest interval lasting 6 beats with a max rate of 193 bpm, the longest lasting 1 min 13 secs with an avg rate of 151 bpm. True duration of Supraventricular Tachycardia difficult to ascertain due to Artifact. Thirteen triggered patient events recorded. Supraventricular Tachycardia was detected within +/- 45 seconds of symptomatic patient event(s). Isolated SVEs were occasional (2.0%, 6813), SVE Couplets were rare (<1.0%, 235), and SVE Triplets were rare (<1.0%, 66). No significant ventricular ectopy noted. - Diltiazem recommended   Myocardial Perfusion Imaging 11/11/2017  Nuclear stress EF: 71%.  The left ventricular ejection fraction is hyperdynamic (>65%).  There was no ST segment deviation noted during stress.  There is a small defect of mild severity present in the apical septal location. The defect is non-reversible. No ischemia noted.  This is a low risk study.   Echo 10/01/2017 Study Conclusions - Left ventricle: The cavity size was normal. Wall thickness was normal. Systolic function was normal. The estimated ejection fraction was in the range of 55% to 60%. Wall motion was  normal; there were no regional wall motion abnormalities. Doppler parameters are consistent with abnormal left ventricular relaxation (grade 1 diastolic dysfunction).    Past Medical History:  Diagnosis Date  . Alcoholism ([place])   . Anxiety   . Arthritis 04-10-11   hips, shoulders, back  . Asthma   . Bipolar disorder ([place])   . Cardiomegaly   . Cervical cancer ([place]) 1993   cervical, no treatment done, went away per pt  . Cervical dysplasia or atypia 04-10-11   '93- once dx.-got pregnant-no intervention, then postpartum, no dysplasia found  . CHF (congestive heart failure) ([place])    no cardiologist 2014 dx, none now  . Chronic hypoxemic respiratory failure ([place])   . Chronic kidney disease    stage 3 kidney disease  . 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 Pathology showed Condyloma, gluteal CONDYLOMA ACUMINATUM.   [name] Kitchen COPD (chronic obstructive pulmonary disease) ([place])   . Depression   . Diabetes mellitus without complication ([place])   . Dyspnea    with actity, sitting  . Fall 12/16/2016  . Fibrocystic breast disease   . GERD (gastroesophageal reflux disease)   . Grade I diastolic dysfunction 97/98/9211   Noted on ECHO  . Hepatitis    hep B-count is low at present,doesn't register  . History of PSVT (  paroxysmal supraventricular tachycardia)   . Hypercalcemia   . Hyperlipidemia   . Hypertension   . Incomplete left bundle branch block (LBBB) 09/24/2018   Noted on EKG  . Lung nodule 11/2017   right and left lung  . Migraine headache    none recent  . Morbid obesity ([facility]) 03/27/2006  . Neuropathy   . Pneumonia    walking pneu 15 yrs. ago  . Skin lesion 03/15/2011   In gluteal crease now s/p removal by Dr. Tsuei of General Surgery on 3/12. Path shows condyloma.     . Sleep apnea 04-10-11   uses cpap, pt does not know settings  . Trigger finger    left third  . Urge incontinence of urine     Past Surgical History:  Procedure Laterality Date   . ANAL FISTULECTOMY  04/17/2011   Procedure: FISTULECTOMY ANAL;  Surgeon: Matthew K. Tsuei, MD;  Location: [facility];  Service: General;  Laterality: N/A;  Excision of Condyloma Gluteal Cleft   . ANAL RECTAL MANOMETRY N/A 03/26/2019   Procedure: ANO RECTAL MANOMETRY;  Surgeon: Beavers, Kimberly, MD;  Location: [facility];  Service: Gastroenterology;  Laterality: N/A;  . BIOPSY  03/09/2019   Procedure: BIOPSY;  Surgeon: Beavers, Kimberly, MD;  Location: [facility];  Service: Gastroenterology;;  . BOTOX INJECTION N/A 12/29/2018   Procedure: BOTOX INJECTION(100 UNITS)  WITH CYSTOSCOPY;  Surgeon: Winter, Christopher Aaron, MD;  Location: [facility];  Service: Urology;  Laterality: N/A;  . BREAST CYST EXCISION     bilateral breast, 3 cysts removed from each breast  . BREAST EXCISIONAL BIOPSY Right    x 3  . BREAST EXCISIONAL BIOPSY Left    x 3  . CARPAL TUNNEL RELEASE Bilateral   . Cervical biospy    . CERVICAL FUSION  04-10-11   x3- cervical fusion with plating and screws-Dr. Cohen  . COLONOSCOPY WITH PROPOFOL N/A 07/06/2015   Procedure: COLONOSCOPY WITH PROPOFOL;  Surgeon: John Hayes, MD;  Location: [facility];  Service: Endoscopy;  Laterality: N/A;  . COLONOSCOPY WITH PROPOFOL N/A 03/09/2019   Procedure: COLONOSCOPY WITH PROPOFOL;  Surgeon: Beavers, Kimberly, MD;  Location: [facility];  Service: Gastroenterology;  Laterality: N/A;  . DOPPLER ECHOCARDIOGRAPHY  04/06/2012   AT [place]- ef 55-60%  . FOOT SURGERY Left 2018  . FRACTURE SURGERY     little toe left foot  . I & D EXTREMITY Left 12/18/2016   Procedure: IRRIGATION AND DEBRIDEMENT LEFT FOOT, CLOSURE;  Surgeon: Xu, Naiping M, MD;  Location: [facility] OR;  Service: Orthopedics;  Laterality: Left;  . LUMBAR DISC SURGERY  04-10-11   x5-Lumbar fusion-retained hardware.(Dr. Nitka)  . MULTIPLE TOOTH EXTRACTIONS    . POLYPECTOMY  03/09/2019   Procedure: POLYPECTOMY;  Surgeon: Beavers, Kimberly, MD;  Location: [facility];  Service:  Gastroenterology;;  . TEE WITHOUT CARDIOVERSION N/A 04/06/2012   Procedure: TRANSESOPHAGEAL ECHOCARDIOGRAM (TEE);  Surgeon: Kenneth C. Hilty, MD;  Location: [facility];  Service: Cardiovascular;  Laterality: N/A;  . TRIGGER FINGER RELEASE Right    middle finger  . TRIGGER FINGER RELEASE Left 06/28/2016   Procedure: RELEASE TRIGGER FINGER LEFT 3RD FINGER;  Surgeon: Xu, Naiping M, MD;  Location: [facility];  Service: Orthopedics;  Laterality: Left;  . TRIGGER FINGER RELEASE Right 06/12/2016   Procedure: RIGHT INDEX FINGER TRIGGER RELEASE;  Surgeon: Xu, Naiping M, MD;  Location: [facility];  Service: Orthopedics;  Laterality: Right;  . TRIGGER FINGER RELEASE Right 01/19/2018   Procedure: RIGHT RING FINGER TRIGGER   FINGER RELEASE;  Surgeon: [name], MD;  Location: [place];  Service: Orthopedics;  Laterality: Right;    MEDICATIONS: . ACCU-CHEK SOFTCLIX LANCETS lancets  . acetaminophen (TYLENOL) 500 MG tablet  . albuterol (VENTOLIN HFA) 108 (90 Base) MCG/ACT inhaler  . ammonium lactate (LAC-HYDRIN) 12 % lotion  . atorvastatin (LIPITOR) 40 MG tablet  . b complex vitamins capsule  . baclofen (LIORESAL) 10 MG tablet  . blood glucose meter kit and supplies KIT  . busPIRone (BUSPAR) 10 MG tablet  . Calcium Citrate-Vitamin D (CALCIUM CITRATE + D3 PO)  . Capsaicin-Menthol (PAIN RELIEF EX)  . diclofenac Sodium (VOLTAREN) 1 % GEL  . diltiazem (CARDIZEM CD) 120 MG 24 hr capsule  . docusate sodium (COLACE) 100 MG capsule  . empagliflozin (JARDIANCE) 25 MG TABS tablet  . febuxostat (ULORIC) 40 MG tablet  . fluticasone (FLONASE) 50 MCG/ACT nasal spray  . Fluticasone-Umeclidin-Vilant (TRELEGY ELLIPTA) 100-62.5-25 MCG/INH AEPB  . folic acid (FOLVITE) 364 MCG tablet  . furosemide (LASIX) 40 MG tablet  . gabapentin (NEURONTIN) 300 MG capsule  . glucose blood (ACCU-CHEK AVIVA PLUS) test strip  . HYDROcodone-acetaminophen (NORCO) 7.5-325 MG tablet  . hydrOXYzine (ATARAX/VISTARIL) 10 MG tablet  . Insulin Glargine  (LANTUS SOLOSTAR) 100 UNIT/ML Solostar Pen  . insulin lispro (HUMALOG KWIKPEN) 100 UNIT/ML KwikPen  . Insulin Pen Needle (B-D UF III MINI PEN NEEDLES) 31G X 5 MM MISC  . ketoconazole (NIZORAL) 2 % cream  . loratadine (CLARITIN) 10 MG tablet  . losartan (COZAAR) 100 MG tablet  . metolazone (ZAROXOLYN) 2.5 MG tablet  . montelukast (SINGULAIR) 10 MG tablet  . Multiple Vitamins-Minerals (MULTIVITAMIN WITH MINERALS) tablet  . mupirocin ointment (BACTROBAN) 2 %  . Na Sulfate-K Sulfate-Mg Sulf 17.5-3.13-1.6 GM/177ML SOLN  . NARCAN 4 MG/0.1ML LIQD nasal spray kit  . nicotine polacrilex (NICORETTE) 4 MG gum  . NON FORMULARY  . nystatin (MYCOSTATIN) 100000 UNIT/ML suspension  . Omega-3 Fatty Acids (FISH OIL) 1000 MG CAPS  . omeprazole (PRILOSEC) 20 MG capsule  . OXYGEN  . potassium chloride SA (KLOR-CON M20) 20 MEQ tablet  . promethazine (PHENERGAN) 25 MG tablet  . Semaglutide,0.25 or 0.5MG/DOS, (OZEMPIC, 0.25 OR 0.5 MG/DOSE,) 2 MG/1.5ML SOPN  . sertraline (ZOLOFT) 50 MG tablet  . varenicline (CHANTIX) 0.5 MG tablet   No current facility-administered medications for this encounter.   Derrill Memo ON 04/05/2019] ceFAZolin (ANCEF) 3 g in dextrose 5 % 50 mL IVPB  . technetium tetrofosmin (TC-MYOVIEW) injection 68.0 millicurie    Myra Gianotti, PA-C Surgical Short Stay/Anesthesiology [facility] Phone [phone] [facility] Phone [phone] 04/02/2019 10:44 AM

## 2019-04-02 NOTE — Telephone Encounter (Signed)
   Primary Cardiologist: [name], MD  Chart reviewed as part of pre-operative protocol coverage. Patient was contacted 04/02/2019 in reference to pre-operative risk assessment for pending surgery as outlined below.  [name] was last seen on 01/17/2019 by Dr. Harrell Gave for preoperative evaluation for the same procedure. She was recommended to undergo a NST at that time for further ischemic evaluation as she was unable to complete 4 METs. It appears she has not completed this study.    I contacted the patient 04/02/19. She has not had any change in her physical abilities since her last visit. She reports she forgot about the need for a stress test. When I told her I would not be able to clear her for the procedure she became quite upset.   Given inability to obtain a NST prior to surgery 04/05/19, patient is not cleared to proceed. She is scheduled to see Dr. Harrell Gave 04/12/19. Will attempt to obtain the stress test prior to her visit with Dr. Harrell Gave so that preop status can be re-addressed at that time.   Pre-op covering staff: - Can you schedule the stress test that was ordered back in 12/2018. Unfortunately this will be a 2 day study given weight >250lbs. If possible make this an urgent study to be done next week.  - Please contact requesting surgeon's office via preferred method (i.e, phone, fax) to inform them of need for appointment prior to surgery.  Abigail Butts, PA-C 04/02/2019, 11:19 AM

## 2019-04-05 ENCOUNTER — Encounter (HOSPITAL_COMMUNITY): Admission: RE | Payer: Self-pay | Source: Home / Self Care

## 2019-04-05 ENCOUNTER — Ambulatory Visit (HOSPITAL_COMMUNITY): Admission: RE | Admit: 2019-04-05 | Payer: Medicare Other | Source: Home / Self Care | Admitting: Orthopaedic Surgery

## 2019-04-05 SURGERY — SHOULDER ARTHROSCOPY WITH ROTATOR CUFF REPAIR AND SUBACROMIAL DECOMPRESSION
Anesthesia: General | Site: Shoulder | Laterality: Right

## 2019-04-05 NOTE — Telephone Encounter (Signed)
Pt is scheduled for stress test 04/09/19.

## 2019-04-06 ENCOUNTER — Ambulatory Visit (INDEPENDENT_AMBULATORY_CARE_PROVIDER_SITE_OTHER): Payer: Medicare Other

## 2019-04-06 ENCOUNTER — Other Ambulatory Visit: Payer: Self-pay

## 2019-04-06 ENCOUNTER — Telehealth (HOSPITAL_COMMUNITY): Payer: Self-pay

## 2019-04-06 DIAGNOSIS — J449 Chronic obstructive pulmonary disease, unspecified: Secondary | ICD-10-CM | POA: Diagnosis not present

## 2019-04-06 NOTE — Telephone Encounter (Signed)
Encounter complete. 

## 2019-04-07 ENCOUNTER — Encounter: Payer: Self-pay | Admitting: Orthopaedic Surgery

## 2019-04-07 ENCOUNTER — Telehealth: Payer: Self-pay | Admitting: Pulmonary Disease

## 2019-04-07 DIAGNOSIS — J449 Chronic obstructive pulmonary disease, unspecified: Secondary | ICD-10-CM

## 2019-04-07 NOTE — Telephone Encounter (Signed)
Spoke with Sonia Baller at [employer]. States that they received an order on the pt for a POC. [employer] is still out of stock. A new order needs to be placed for the pt to be fitted with the best portable oxygen they do have in stock. Order has been placed. Nothing further was needed.

## 2019-04-08 DIAGNOSIS — N2581 Secondary hyperparathyroidism of renal origin: Secondary | ICD-10-CM | POA: Diagnosis not present

## 2019-04-08 DIAGNOSIS — I509 Heart failure, unspecified: Secondary | ICD-10-CM | POA: Diagnosis not present

## 2019-04-08 DIAGNOSIS — I129 Hypertensive chronic kidney disease with stage 1 through stage 4 chronic kidney disease, or unspecified chronic kidney disease: Secondary | ICD-10-CM | POA: Diagnosis not present

## 2019-04-08 DIAGNOSIS — N1831 Chronic kidney disease, stage 3a: Secondary | ICD-10-CM | POA: Diagnosis not present

## 2019-04-08 DIAGNOSIS — E876 Hypokalemia: Secondary | ICD-10-CM | POA: Diagnosis not present

## 2019-04-09 ENCOUNTER — Other Ambulatory Visit: Payer: Self-pay

## 2019-04-09 ENCOUNTER — Ambulatory Visit (HOSPITAL_COMMUNITY)
Admission: RE | Admit: 2019-04-09 | Discharge: 2019-04-09 | Disposition: A | Discharge status: Home / Self Care | Payer: Medicare Other | Source: Ambulatory Visit | Attending: Cardiology | Admitting: Cardiology

## 2019-04-09 DIAGNOSIS — I471 Supraventricular tachycardia: Secondary | ICD-10-CM | POA: Diagnosis not present

## 2019-04-09 DIAGNOSIS — R0789 Other chest pain: Secondary | ICD-10-CM | POA: Insufficient documentation

## 2019-04-09 DIAGNOSIS — R079 Chest pain, unspecified: Secondary | ICD-10-CM | POA: Diagnosis not present

## 2019-04-09 DIAGNOSIS — I5032 Chronic diastolic (congestive) heart failure: Secondary | ICD-10-CM

## 2019-04-09 MED ORDER — REGADENOSON 0.4 MG/5ML IV SOLN: 0.4000 mg | Freq: Once | INTRAVENOUS | Status: DC

## 2019-04-09 MED ORDER — TECHNETIUM TC 99M TETROFOSMIN IV KIT
30.0000 | PACK | Freq: Once | INTRAVENOUS | Status: DC | PRN
  Filled 2019-04-09: qty 30

## 2019-04-12 ENCOUNTER — Telehealth: Payer: Medicare Other | Admitting: Cardiology

## 2019-04-13 ENCOUNTER — Other Ambulatory Visit: Payer: Self-pay

## 2019-04-13 ENCOUNTER — Ambulatory Visit (HOSPITAL_COMMUNITY)
Admission: RE | Admit: 2019-04-13 | Discharge: 2019-04-13 | Disposition: A | Discharge status: Home / Self Care | Payer: Medicare Other | Source: Ambulatory Visit | Attending: Cardiology | Admitting: Cardiology

## 2019-04-13 ENCOUNTER — Inpatient Hospital Stay: Payer: Medicare Other | Admitting: Orthopaedic Surgery

## 2019-04-13 LAB — MYOCARDIAL PERFUSION IMAGING
LV dias vol: 85 mL (ref 46–106)
LV sys vol: 45 mL
Peak HR: 131 {beats}/min
Rest HR: 99 {beats}/min
SDS: 1
SRS: 2
SSS: 1
TID: 0.86

## 2019-04-13 MED ORDER — TECHNETIUM TC 99M TETROFOSMIN IV KIT
32.1000 | PACK | Freq: Once | INTRAVENOUS | Status: AC | PRN
  Administered 2019-04-13: 32.1 via INTRAVENOUS

## 2019-04-13 MED ORDER — REGADENOSON 0.4 MG/5ML IV SOLN
0.4000 mg | Freq: Once | INTRAVENOUS | Status: AC
  Administered 2019-04-09: 0.4 mg via INTRAVENOUS

## 2019-04-13 MED ORDER — TECHNETIUM TC 99M TETROFOSMIN IV KIT
30.0000 | PACK | Freq: Once | INTRAVENOUS | Status: AC | PRN
  Administered 2019-04-09: 30 via INTRAVENOUS

## 2019-04-13 NOTE — Telephone Encounter (Signed)
She just had it today...looks like it hasn't been read yet. Reader B will read it today. Thanks.

## 2019-04-14 ENCOUNTER — Encounter: Payer: Self-pay | Admitting: Orthopaedic Surgery

## 2019-04-14 ENCOUNTER — Encounter: Payer: Self-pay | Admitting: Cardiology

## 2019-04-14 ENCOUNTER — Telehealth (INDEPENDENT_AMBULATORY_CARE_PROVIDER_SITE_OTHER): Payer: Medicare Other | Admitting: Cardiology

## 2019-04-14 VITALS — BP 147/80 | HR 103 | Ht 67.0 in | Wt 363.6 lb

## 2019-04-14 DIAGNOSIS — Z01818 Encounter for other preprocedural examination: Secondary | ICD-10-CM

## 2019-04-14 DIAGNOSIS — Z794 Long term (current) use of insulin: Secondary | ICD-10-CM

## 2019-04-14 DIAGNOSIS — Z716 Tobacco abuse counseling: Secondary | ICD-10-CM

## 2019-04-14 DIAGNOSIS — E1122 Type 2 diabetes mellitus with diabetic chronic kidney disease: Secondary | ICD-10-CM

## 2019-04-14 DIAGNOSIS — F17210 Nicotine dependence, cigarettes, uncomplicated: Secondary | ICD-10-CM

## 2019-04-14 DIAGNOSIS — Z712 Person consulting for explanation of examination or test findings: Secondary | ICD-10-CM

## 2019-04-14 DIAGNOSIS — I5032 Chronic diastolic (congestive) heart failure: Secondary | ICD-10-CM | POA: Diagnosis not present

## 2019-04-14 DIAGNOSIS — I130 Hypertensive heart and chronic kidney disease with heart failure and stage 1 through stage 4 chronic kidney disease, or unspecified chronic kidney disease: Secondary | ICD-10-CM

## 2019-04-14 DIAGNOSIS — N183 Chronic kidney disease, stage 3 unspecified: Secondary | ICD-10-CM

## 2019-04-14 DIAGNOSIS — E7800 Pure hypercholesterolemia, unspecified: Secondary | ICD-10-CM

## 2019-04-14 DIAGNOSIS — I10 Essential (primary) hypertension: Secondary | ICD-10-CM

## 2019-04-14 DIAGNOSIS — I471 Supraventricular tachycardia: Secondary | ICD-10-CM

## 2019-04-14 NOTE — Progress Notes (Signed)
Virtual Visit via Video Note   This visit type was conducted due to national recommendations for restrictions regarding the COVID-19 Pandemic (e.g. social distancing) in an effort to limit this patient's exposure and mitigate transmission in our community.  Due to her co-morbid illnesses, this patient is at least at moderate risk for complications without adequate follow up.  This format is felt to be most appropriate for this patient at this time.  All issues noted in this document were discussed and addressed.  A limited physical exam was performed with this format.  Please refer to the patient's chart for her consent to telehealth for [facility].   Date:  9/93/5701   ID:  [name], DOB [birth year], MRN [id number]  Patient Location: Home Provider Location: Home  PCP:  [name], MD  Cardiologist:  [name], MD  Electrophysiologist:  None   Evaluation Performed:  Follow-Up Visit  Chief Complaint: follow up  History of Present Illness:    JONAE RENSHAW is a 55 y.o. female with chronic diastolic heart failure, chronic hypoxic respiratory failure, COPD, OSA on PAP, morbid obesity, paroxysmal SVT, type II diabetes on insulin, CKD stage 3, hypertension, hyperlipidemia   The patient does not have symptoms concerning for COVID-19 infection (fever, chills, cough, or new shortness of breath).   Cardiac history: Complex, see my initial notes. Has struggled with diastolic heart failure, has chest pai when she gets fluid overloaded. Was on oxygen, having "breathing crises" that seemed to be related to paroxysmal SVT. She was well controlled for many months, then had an episode requiring hospitalization 08/2018 for chest pain and elevated weight. HsTn unremarkable (6 -> 3), no SVT on monitor. BNP 41.7. Covid negative. Was diuresed, lost fluid, swelling on left side went down while she was laying down but came back when she got back up. She attributes the episode to a  myelogram, gained 16 lbs and started having symptoms.   Today: Seen for preoperative clearance. We discussed at our last visit, but as she could not do 4 METs, she was recommended for stress test. This unfortunately did not get completed until yesterday, so she had to push back her surgery. She tells me they elected to start with her left shoulder and left index trigger finger. She will eventually need her right side done in the future. She feels that carrying heavy O2 tanks around was part of the reason her shoulders developed issues.  We discussed smoking cessation. She is down to 4 cigarettes/day and working to quit. She knows about the risk of smoking with oxygen and does not do this.  No chest pain. Only short of breath when she walks without oxygen. No syncope. No palpitations.   Past Medical History:  Diagnosis Date  . Alcoholism ([place])   . Anxiety   . Arthritis 04-10-11   hips, shoulders, back  . Asthma   . Bipolar disorder ([place])   . Cardiomegaly   . Cervical cancer ([place]) 1993   cervical, no treatment done, went away per pt  . Cervical dysplasia or atypia 04-10-11   '93- once dx.-got pregnant-no intervention, then postpartum, no dysplasia found  . CHF (congestive heart failure) ([place])    no cardiologist 2014 dx, none now  . Chronic hypoxemic respiratory failure ([place])   . Chronic kidney disease    stage 3 kidney disease  . 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 Pathology showed Condyloma, gluteal CONDYLOMA ACUMINATUM.   [name] Kitchen COPD (  chronic obstructive pulmonary disease) ([place])   . Depression   . Diabetes mellitus without complication ([place])   . Dyspnea    with actity, sitting  . Fall 12/16/2016  . Fibrocystic breast disease   . GERD (gastroesophageal reflux disease)   . Grade I diastolic dysfunction 98/26/4158   Noted on ECHO  . Hepatitis    hep B-count is low at present,doesn't register  . History of PSVT (paroxysmal supraventricular tachycardia)   .  Hypercalcemia   . Hyperlipidemia   . Hypertension   . Incomplete left bundle branch block (LBBB) 09/24/2018   Noted on EKG  . Lung nodule 11/2017   right and left lung  . Migraine headache    none recent  . Morbid obesity ([place]) 03/27/2006  . Neuropathy   . Pneumonia    walking pneu 15 yrs. ago  . Skin lesion 03/15/2011   In gluteal crease now s/p removal by Dr. [name] of General Surgery on 3/12. Path shows condyloma.     . Sleep apnea 04-10-11   uses cpap, pt does not know settings  . Trigger finger    left third  . Urge incontinence of urine    Past Surgical History:  Procedure Laterality Date  . ANAL FISTULECTOMY  04/17/2011   Procedure: FISTULECTOMY ANAL;  Surgeon: Imogene Burn. [name], MD;  Location: [facility];  Service: General;  Laterality: N/A;  Excision of Condyloma Gluteal Cleft   . ANAL RECTAL MANOMETRY N/A 03/26/2019   Procedure: ANO RECTAL MANOMETRY;  Surgeon: Thornton Park, MD;  Location: [facility];  Service: Gastroenterology;  Laterality: N/A;  . BIOPSY  03/09/2019   Procedure: BIOPSY;  Surgeon: Thornton Park, MD;  [facility]: [facility];  Service: Gastroenterology;;  . Lum [place] INJECTION N/A 12/29/2018   Procedure: BOTOX INJECTION(100 UNITS)  WITH CYSTOSCOPY;  Surgeon: Ceasar Mons, MD;  Location: [facility];  Service: Urology;  Laterality: N/A;  . BREAST CYST EXCISION     bilateral breast, 3 cysts removed from each breast  . BREAST EXCISIONAL BIOPSY Right    x 3  . BREAST EXCISIONAL BIOPSY Left    x 3  . CARPAL TUNNEL RELEASE Bilateral   . Cervical biospy    . CERVICAL FUSION  04-10-11   x3- cervical fusion with plating and screws-Dr. [name]  . COLONOSCOPY WITH PROPOFOL N/A 07/06/2015   Procedure: COLONOSCOPY WITH PROPOFOL;  Surgeon: [name], MD;  [facility]: [facility];  Service: Endoscopy;  Laterality: N/A;  . COLONOSCOPY WITH PROPOFOL N/A 03/09/2019   Procedure: COLONOSCOPY WITH PROPOFOL;  Surgeon: Thornton Park, MD;  [facility];  Service:  Gastroenterology;  Laterality: N/A;  . DOPPLER ECHOCARDIOGRAPHY  04/06/2012   AT [place] 55-60%  . FOOT SURGERY Left 2018  . FRACTURE SURGERY     little toe left foot  . I & D EXTREMITY Left 12/18/2016   Procedure: IRRIGATION AND DEBRIDEMENT LEFT FOOT, CLOSURE;  Surgeon: [name], MD;  Location: [place];  Service: Orthopedics;  Laterality: Left;  . [place] SURGERY  04-10-11   x5-Lumbar fusion-retained hardware.(Dr. Louanne Skye)  . MULTIPLE TOOTH EXTRACTIONS    . POLYPECTOMY  03/09/2019   Procedure: POLYPECTOMY;  Surgeon: Thornton Park, MD;  [facility];  Service: Gastroenterology;;  . TEE WITHOUT CARDIOVERSION N/A 04/06/2012   Procedure: TRANSESOPHAGEAL ECHOCARDIOGRAM (TEE);  Surgeon: [name], MD;  Location: [facility];  Service: Cardiovascular;  Laterality: N/A;  . TRIGGER FINGER RELEASE Right    middle finger  . TRIGGER FINGER  RELEASE Left 06/28/2016   Procedure: RELEASE TRIGGER FINGER LEFT 3RD FINGER;  Surgeon: [name], MD;  Location: [place];  Service: Orthopedics;  Laterality: Left;  . TRIGGER FINGER RELEASE Right 06/12/2016   Procedure: RIGHT INDEX FINGER TRIGGER RELEASE;  Surgeon: [name], MD;  Location: [place];  Service: Orthopedics;  Laterality: Right;  . TRIGGER FINGER RELEASE Right 01/19/2018   Procedure: RIGHT RING FINGER TRIGGER FINGER RELEASE;  Surgeon: [name], MD;  Location: [place];  Service: Orthopedics;  Laterality: Right;     Current Meds  Medication Sig  . ACCU-CHEK SOFTCLIX LANCETS lancets TEST 4 TIMES DAILY  . acetaminophen (TYLENOL) 500 MG tablet Take 1,000 mg by mouth 2 (two) times daily.   [name] Kitchen albuterol (VENTOLIN HFA) 108 (90 Base) MCG/ACT inhaler Inhale 2 puffs into the lungs every 6 (six) hours as needed for wheezing or shortness of breath.  [name] Kitchen ammonium lactate (LAC-HYDRIN) 12 % lotion APPLY 1 APPLICATION TOPICALLY 2 (TWO) TIMES DAILY AS NEEDED FOR DRY SKIN.  [name] Kitchen atorvastatin (LIPITOR) 40 MG tablet TAKE 1 TABLET BY MOUTH  EVERY DAY (Patient taking differently: Take 40 mg by mouth daily. )  . b complex vitamins capsule Take 1 capsule by mouth daily.  . baclofen (LIORESAL) 10 MG tablet Take 1 tablet (10 mg total) by mouth 3 (three) times daily as needed for muscle spasms. (Patient taking differently: Take 10 mg by mouth 3 (three) times daily. )  . blood glucose meter kit and supplies KIT Dispense based on patient and insurance preference. Use up to four times daily as directed. (FOR ICD-9 250.00, 250.01).  . busPIRone (BUSPAR) 10 MG tablet TAKE 1 TABLET BY MOUTH THREE TIMES A DAY (Patient taking differently: Take 10 mg by mouth 3 (three) times daily. )  . diltiazem (CARDIZEM CD) 120 MG 24 hr capsule Take 1 capsule (120 mg total) by mouth 2 (two) times daily.  [name] Kitchen docusate sodium (COLACE) 100 MG capsule Take 200 mg by mouth 2 (two) times daily.  . empagliflozin (JARDIANCE) 25 MG TABS tablet Take 25 mg by mouth daily.  . febuxostat (ULORIC) 40 MG tablet Take 40 mg by mouth at bedtime.   . fluticasone (FLONASE) 50 MCG/ACT nasal spray Place 2 sprays into both nostrils 2 (two) times daily.  . Fluticasone-Umeclidin-Vilant (TRELEGY ELLIPTA) 100-62.5-25 MCG/INH AEPB Inhale 1 puff into the lungs daily.  . folic acid (FOLVITE) 856 MCG tablet Take 400 mcg by mouth daily.  . furosemide (LASIX) 40 MG tablet Take 2 tablets (80 mg total) by mouth 2 (two) times daily.  [name] Kitchen gabapentin (NEURONTIN) 300 MG capsule TAKE 2 CAPSULES (600 MG TOTAL) BY MOUTH 3 (THREE) TIMES DAILY.  [name] Kitchen glucose blood (ACCU-CHEK AVIVA PLUS) test strip TEST 4 TIMES DAILY (Patient taking differently: 1 each by Other route 3 (three) times daily. TEST 3 TIMES DAILY)  . HYDROcodone-acetaminophen (NORCO) 7.5-325 MG tablet Take 1 tablet by mouth every 6 (six) hours as needed for moderate pain. (Patient taking differently: Take 1 tablet by mouth in the morning, at noon, in the evening, and at bedtime. )  . hydrOXYzine (ATARAX/VISTARIL) 10 MG tablet TAKE 1 TABLET BY MOUTH EVERY  DAY (Patient taking differently: Take 10 mg by mouth daily. )  . Insulin Glargine (LANTUS SOLOSTAR) 100 UNIT/ML Solostar Pen Inject 22 Units into the skin daily before breakfast. (Patient taking differently: Inject 20 Units into the skin daily before breakfast. )  . insulin lispro (HUMALOG KWIKPEN) 100 UNIT/ML KwikPen Inject 0.08 mLs (8  Units total) into the skin 3 (three) times daily before meals.  . Insulin Pen Needle (B-D UF III MINI PEN NEEDLES) 31G X 5 MM MISC CHECK SUGARS 4 TIMES A DAY BEFORE MEALS AND AT BEDTIME.  [name] Kitchen ketoconazole (NIZORAL) 2 % cream APPLY 1 FINGERTIP AMOUNT TO EACH FOOT DAILY. (Patient taking differently: Apply 1 application topically 2 (two) times daily. )  . loratadine (CLARITIN) 10 MG tablet TAKE 1 TABLET BY MOUTH EVERY DAY (Patient taking differently: Take 10 mg by mouth daily. )  . losartan (COZAAR) 100 MG tablet Take 1 tablet (100 mg total) by mouth daily.  . metolazone (ZAROXOLYN) 2.5 MG tablet Take 1 tablet (2.5 mg total) by mouth 2 (two) times a week. TAKE 1 TABLET TWICE WEEKLY ON MONDAYS AND THURSDAYS ONLY (Patient taking differently: Take 2.5 mg by mouth See admin instructions. TAKE 2.5 BY MOUTH  TWICE WEEKLY ON MONDAYS AND THURSDAYS ONLY)  . montelukast (SINGULAIR) 10 MG tablet TAKE 1 TABLET BY MOUTH EVERYDAY AT BEDTIME (Patient taking differently: Take 10 mg by mouth at bedtime. )  . Multiple Vitamins-Minerals (MULTIVITAMIN WITH MINERALS) tablet Take 1 tablet by mouth daily.  . mupirocin ointment (BACTROBAN) 2 % APPLY TO AFFECTED AREA TWICE A DAY (Patient taking differently: Apply 1 application topically 2 (two) times daily as needed (skin fold (stomach)). )  . NARCAN 4 MG/0.1ML LIQD nasal spray kit Place 1 spray into the nose as needed (accidental overdose).   . nicotine polacrilex (NICORETTE) 4 MG gum CHEW 1 EACH (4 MG TOTAL) BY MOUTH AS NEEDED FOR SMOKING CESSATION. (Patient taking differently: Take 4 mg by mouth as needed for smoking cessation. )  . NON FORMULARY  Uses a C-PAP at bedtime  . nystatin (MYCOSTATIN) 100000 UNIT/ML suspension Take 5 mLs (500,000 Units total) by mouth 4 (four) times daily. Swish and swallow. (Patient taking differently: Take 5 mLs by mouth 4 (four) times daily as needed (dry mouth). Swish and swallow.)  . Omega-3 Fatty Acids (FISH OIL) 1000 MG CAPS Take 1,000 mg by mouth daily.   [name] Kitchen omeprazole (PRILOSEC) 20 MG capsule Take 1 capsule (20 mg total) by mouth daily.  . OXYGEN Inhale 2 L into the lungs continuous.   . potassium chloride SA (KLOR-CON M20) 20 MEQ tablet Take 1 tablet (20 mEq total) by mouth 2 (two) times a week. Take 1 tablet twice weekly on Monday and Thursday. (Patient taking differently: Take 20 mEq by mouth See admin instructions. Take 20 mEq by mouth twice weekly on Monday and Thursday.)  . promethazine (PHENERGAN) 25 MG tablet Take 1 tablet (25 mg total) by mouth every 6 (six) hours as needed for nausea.  . sertraline (ZOLOFT) 50 MG tablet TAKE 1 TABLET BY MOUTH EVERY DAY (Patient taking differently: Take 50 mg by mouth daily. )  . varenicline (CHANTIX) 0.5 MG tablet Take 1 tablet (0.5 mg total) by mouth 2 (two) times daily.     Allergies:   Aspirin, Chantix [varenicline tartrate], Methadone hcl, Corticosteroids, and Levofloxacin   Social History   Tobacco Use  . Smoking status: Current Every Day Smoker    Packs/day: 0.20    Years: 40.00    Pack years: 8.00    Types: Cigarettes    Start date: 01/28/1977  . Smokeless tobacco: Never Used  . Tobacco comment: Quit 8 months 2019 - restart 05/2018   Hx 1.5 PPD  Substance Use Topics  . Alcohol use: No    Alcohol/week: 0.0 standard drinks  . Drug use:  Not Currently    Types: Marijuana, "Crack" cocaine    Comment: hx marijuana usemany years ago, Crack -none since 11/2015     Family Hx: The patient's family history includes Alcohol abuse in her father; Arthritis in her sister; Asthma in her sister; Breast cancer in her maternal aunt; Depression in her sister;  Diabetes in an other family member; Hypertension in her father and another family member; Pneumonia in her father; Pulmonary embolism in her mother; Sickle cell anemia in her son; Stroke in her father.  ROS:   Please see the history of present illness.  ROS otherwise unremarkable except as noted  Prior CV studies:   The following studies were reviewed today: Stress test 04/09/19  The left ventricular ejection fraction is mildly decreased (45-54%).  Nuclear stress EF: 47%.  There was no ST segment deviation noted during stress.  This is an intermediate risk study due to reduced systolic function. There is no ischemia.  New wall motion abnormalities noted. Consider echo if clinically warranted.  Monitor, stress test, echo 2019  Labs/Other Tests and Data Reviewed:    EKG:  An ECG dated 09/24/18 was personally reviewed today and demonstrated:  SR, PAC, IVCD  Recent Labs: 09/24/2018: B Natriuretic Peptide 41.7 04/01/2019: ALT 39; BUN 22; Creatinine, Ser 1.31; Hemoglobin 15.4; Platelets 319; Potassium 3.5; Sodium 142   Recent Lipid Panel Lab Results  Component Value Date/Time   CHOL 146 09/24/2018 11:36 PM   CHOL 145 10/27/2017 09:44 AM   TRIG 165 (H) 09/24/2018 11:36 PM   HDL 37 (L) 09/24/2018 11:36 PM   HDL 55 10/27/2017 09:44 AM   CHOLHDL 3.9 09/24/2018 11:36 PM   LDLCALC 76 09/24/2018 11:36 PM   LDLCALC 70 10/27/2017 09:44 AM   LDLDIRECT 114 (H) 09/15/2012 03:59 PM    Wt Readings from Last 3 Encounters:  04/14/19 (!) 363 lb 9.6 oz (164.9 kg)  04/09/19 (!) 375 lb (170.1 kg)  04/01/19 (!) 375 lb (170.1 kg)      Objective:    Vital Signs:  BP (!) 147/80   Pulse (!) 103   Ht '5\' 7"'  (1.702 m)   Wt (!) 363 lb 9.6 oz (164.9 kg)   LMP 04/10/2006   BMI 56.95 kg/m    VITAL SIGNS:  reviewed GEN:  no acute distress. O2 nasal cannula in place EYES:  sclerae anicteric, EOMI - Extraocular Movements Intact RESPIRATORY:  normal respiratory effort, symmetric  expansion CARDIOVASCULAR:  no visible JVD SKIN:  no rash, lesions or ulcers. MUSCULOSKELETAL:  no obvious deformities. NEURO:  alert and oriented x 3, no obvious focal deficit PSYCH:  normal affect  ASSESSMENT & PLAN:    Preoperative cardiovascular exam, review of stress test results: she has had 2 day nuclear stress test as she cannot achieve >4 METs of activity. I personally reviewed this study. There is no evidence of ischemia. There is some question of low normal EF and wall motion, but on my review of the study, I cannot exclude this being artifact. Without evidence of ischemia, she is acceptable risk for surgery. -Recommend close respiratory monitoring given her OSA and chronic obesity hypoventilation syndrome.  Chronic diastolic heart failure:stable NYHA class III symptoms today. Weights stable. -back on baseline regimen of furosemide 80 mg BID and metolazone 2.5 mg twice weekly (with potassium) -doing an excellent job with weight loss. Very motivated. -very strict on diet, daily weights, monitoring for symptoms.   Paroxysmal SVT: no recent symptoms -continue diltiazem  Type II diabetes with  stage 3 CKD: on insulin and jardiance -working on diet and weight loss -ultimately planning for bariatric surgery  Hypertension: mildly elevated today but has been well controlled. -she will continue to monitor. Goal <130/80. Will call if becomes consistently elevated -continue dilitazem, diuretics, losartan  Hypercholesterolemia: continue atorvastatin, lipids rechecked in hospital 08/2018  Tobacco cessation counseling: The patient was counseled on tobacco cessation today for 3 minutes.  Counseling included reviewing the risks of smoking tobacco products, how it impacts the patient's current medical diagnoses and different strategies for quitting.  Pharmacotherapy to aid in tobacco cessation was not prescribed today.  COVID-19 Education: The signs and symptoms of COVID-19 were  discussed with the patient and how to seek care for testing (follow up with PCP or arrange E-visit).  The importance of social distancing was discussed today.  Time:   Today, I have spent 9 minutes with the patient with telehealth technology discussing the above problems.  This time is independent of tobacco cessation counseling. Additional time spent for review and documentation.   Medication Adjustments/Labs and Tests Ordered: Current medicines are reviewed at length with the patient today.  Concerns regarding medicines are outlined above.    Patient Instructions  Medication Instructions:  Your Physician recommend you continue on your current medication as directed.    *If you need a refill on your cardiac medications before your next appointment, please call your pharmacy*   Lab Work: None   Testing/Procedures: None   Follow-Up: At [facility], you and your health needs are our priority.  As part of our continuing mission to provide you with exceptional heart care, we have created designated Provider Care Teams.  These Care Teams include your primary Cardiologist (physician) and Advanced Practice Providers (APPs -  Physician Assistants and Nurse Practitioners) who all work together to provide you with the care you need, when you need it.  We recommend signing up for the patient portal called "MyChart".  Sign up information is provided on this After Visit Summary.  MyChart is used to connect with patients for Virtual Visits (Telemedicine).  Patients are able to view lab/test results, encounter notes, upcoming appointments, etc.  Non-urgent messages can be sent to your provider as well.   To learn more about what you can do with MyChart, go to [email or web address].    Your next appointment:   3 month(s)  The format for your next appointment:   In Person  Provider:   [name], MD      Follow Up:  3 mos  Signed, [name], MD  04/14/2019  4:49 PM    [place]

## 2019-04-14 NOTE — Patient Instructions (Signed)
Medication Instructions:  Your Physician recommend you continue on your current medication as directed.    *If you need a refill on your cardiac medications before your next appointment, please call your pharmacy*   Lab Work: None   Testing/Procedures: None   Follow-Up: At [facility], you and your health needs are our priority.  As part of our continuing mission to provide you with exceptional heart care, we have created designated Provider Care Teams.  These Care Teams include your primary Cardiologist (physician) and Advanced Practice Providers (APPs -  Physician Assistants and Nurse Practitioners) who all work together to provide you with the care you need, when you need it.  We recommend signing up for the patient portal called "MyChart".  Sign up information is provided on this After Visit Summary.  MyChart is used to connect with patients for Virtual Visits (Telemedicine).  Patients are able to view lab/test results, encounter notes, upcoming appointments, etc.  Non-urgent messages can be sent to your provider as well.   To learn more about what you can do with MyChart, go to [email or web address].    Your next appointment:   3 months The format for your next appointment:   In Person  Provider:   Bridgette Christopher, MD    

## 2019-04-15 NOTE — Telephone Encounter (Signed)
"  Preoperative cardiovascular exam, review of stress test results: she has had 2 day nuclear stress test as she cannot achieve >4 METs of activity. I personally reviewed this study. There is no evidence of ischemia. There is some question of low normal EF and wall motion, but on my review of the study, I cannot exclude this being artifact. Without evidence of ischemia, she is acceptable risk for surgery. -Recommend close respiratory monitoring given her OSA and chronic obesity hypoventilation syndrome".

## 2019-04-15 NOTE — Telephone Encounter (Signed)
Sounds like she's cleared.  Can you check on this please.  Thanks.

## 2019-04-16 ENCOUNTER — Other Ambulatory Visit: Payer: Self-pay

## 2019-04-16 ENCOUNTER — Encounter: Payer: Self-pay | Admitting: Gastroenterology

## 2019-04-16 ENCOUNTER — Ambulatory Visit (INDEPENDENT_AMBULATORY_CARE_PROVIDER_SITE_OTHER): Payer: Medicare Other | Admitting: Gastroenterology

## 2019-04-16 VITALS — BP 124/68 | HR 80 | Temp 97.4°F | Ht 70.0 in | Wt 366.4 lb

## 2019-04-16 DIAGNOSIS — K635 Polyp of colon: Secondary | ICD-10-CM

## 2019-04-16 DIAGNOSIS — R278 Other lack of coordination: Secondary | ICD-10-CM

## 2019-04-16 DIAGNOSIS — K6289 Other specified diseases of anus and rectum: Secondary | ICD-10-CM

## 2019-04-16 NOTE — Progress Notes (Signed)
Referring Provider: [name], MD Primary Care Physician:  [name], MD  Chief complaint:  Rectal knot   IMPRESSION:  Dyssynergia defecation presenting with rectal fullness "knot" without rectal bleeding Personal history of colon polyps     - 3 right sided colon sessile serrated polyps on colonoscopy 03/09/19    - surveillance colonoscopy recommended in 3 years BMI 56 COPD on oxygen  Rectal fullness: Anorectal manometry confirms dyssynergia defecation. Reviewed diagnosis. Reviewed indication for stool bulking.  Referral to pelvic floor physical therapy for biofeedback.   Colon polyps: Reviewed colonoscopy findings and pathology results. Surveillance colonoscopy recommended in 3 years.   PLAN: Use a daily dose of stool bulking agent such as psyllium or methylcellulose To pelvic floor physical therapy for biofeedback Surveillance colonoscopy in 3 years Call to schedule follow-up if symptoms persist after PT    Please see the "Patient Instructions" section for addition details about the plan.  HPI: [name] is a 54 y.o. female who returns in scheduled follow-up after her initial consultation, colonoscopy, and anorectal manometry.  The interval history is obtained through the patient and review of her electronic health record.   On  02/08/2019, while having a bowel movement,  "I sneezed and a big knot came on the inside of my butt hole".  She has felt a lump on the inside of rectum on the right since that time. Most aware of the fullness when defecating and blowing her nose. She is aware of the fullness but denies any pain. Feels that the fullness increased in size after her evaluation by Dr. Caron Presume.  No history of similar symptoms. No blood or mucous in the stool. No history of rectal trauma or foreign body. No constipation or diarrhea. No straining. No sense of incomplete evacuation. No history of manual assistance with defecation.   No hemorrhoids on a normal  screening colonoscopy with Dr. Amedeo Plenty 07/06/15  Colonoscopy 03/09/19: - Small non-bleeding internal hemorrhoids. - No identified rectal mass or polyp. - 3 right-sided sessile serrated polyps.  - The examination was otherwise normal on direct and retroflexion views.  Anorectal manometry 03/26/19: Normal anal sphincter pressure.  Evidence of dyssynergia defecation.  Rectal hypersensitivity with low compliance.    Returns in follow-up. Did not take the recommended Metamucil because she didn't feel constipated. Continues to feel the rectal knot. Symptoms unchanged from the time of her initial consultation. No new complaints or concerns.     Past Medical History:  Diagnosis Date  . Alcoholism ([place])   . Anxiety   . Arthritis 04-10-11   hips, shoulders, back  . Asthma   . Bipolar disorder ([place])   . Cardiomegaly   . Cervical cancer ([place]) 1993   cervical, no treatment done, went away per pt  . Cervical dysplasia or atypia 04-10-11   '93- once dx.-got pregnant-no intervention, then postpartum, no dysplasia found  . CHF (congestive heart failure) ([place])    no cardiologist 2014 dx, none now  . Chronic hypoxemic respiratory failure ([place])   . Chronic kidney disease    stage 3 kidney disease  . 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 Pathology showed Condyloma, gluteal CONDYLOMA ACUMINATUM.   [name] Kitchen COPD (chronic obstructive pulmonary disease) ([place])   . Depression   . Diabetes mellitus without complication ([place])   . Dyspnea    with actity, sitting  . Fall 12/16/2016  . Fibrocystic breast disease   . GERD (gastroesophageal reflux disease)   . Grade  I diastolic dysfunction 60/10/9321   Noted on ECHO  . Hepatitis    hep B-count is low at present,doesn't register  . History of PSVT (paroxysmal supraventricular tachycardia)   . Hypercalcemia   . Hyperlipidemia   . Hypertension   . Incomplete left bundle branch block (LBBB) 09/24/2018   Noted on EKG  . Lung nodule 11/2017     right and left lung  . Migraine headache    none recent  . Morbid obesity ([place]) 03/27/2006  . Neuropathy   . Pneumonia    walking pneu 15 yrs. ago  . Skin lesion 03/15/2011   In gluteal crease now s/p removal by Dr. [name] of General Surgery on 3/12. Path shows condyloma.     . Sleep apnea 04-10-11   uses cpap, pt does not know settings  . Trigger finger    left third  . Urge incontinence of urine     Past Surgical History:  Procedure Laterality Date  . ANAL FISTULECTOMY  04/17/2011   Procedure: FISTULECTOMY ANAL;  Surgeon: Imogene Burn. [name], MD;  Location: [facility];  Service: General;  Laterality: N/A;  Excision of Condyloma Gluteal Cleft   . ANAL RECTAL MANOMETRY N/A 03/26/2019   Procedure: ANO RECTAL MANOMETRY;  Surgeon: Thornton Park, MD;  Location: [facility];  Service: Gastroenterology;  Laterality: N/A;  . BIOPSY  03/09/2019   Procedure: BIOPSY;  Surgeon: Thornton Park, MD;  Location: [facility];  Service: Gastroenterology;;  . Lum [place] INJECTION N/A 12/29/2018   Procedure: BOTOX INJECTION(100 UNITS)  WITH CYSTOSCOPY;  Surgeon: Ceasar Mons, MD;  Location: [facility];  Service: Urology;  Laterality: N/A;  . BREAST CYST EXCISION     bilateral breast, 3 cysts removed from each breast  . BREAST EXCISIONAL BIOPSY Right    x 3  . BREAST EXCISIONAL BIOPSY Left    x 3  . CARPAL TUNNEL RELEASE Bilateral   . Cervical biospy    . CERVICAL FUSION  04-10-11   x3- cervical fusion with plating and screws-Dr. [name]  . COLONOSCOPY WITH PROPOFOL N/A 07/06/2015   Procedure: COLONOSCOPY WITH PROPOFOL;  Surgeon: [name], MD;  [facility]: [facility];  Service: Endoscopy;  Laterality: N/A;  . COLONOSCOPY WITH PROPOFOL N/A 03/09/2019   Procedure: COLONOSCOPY WITH PROPOFOL;  Surgeon: Thornton Park, MD;  Location: [facility];  Service: Gastroenterology;  Laterality: N/A;  . DOPPLER ECHOCARDIOGRAPHY  04/06/2012   AT [place] 55-60%  . FOOT SURGERY Left 2018  . FRACTURE  SURGERY     little toe left foot  . I & D EXTREMITY Left 12/18/2016   Procedure: IRRIGATION AND DEBRIDEMENT LEFT FOOT, CLOSURE;  Surgeon: [name], MD;  Location: [facility];  Service: Orthopedics;  Laterality: Left;  . [place] SURGERY  04-10-11   x5-Lumbar fusion-retained hardware.(Dr. Louanne Skye)  . MULTIPLE TOOTH EXTRACTIONS    . POLYPECTOMY  03/09/2019   Procedure: POLYPECTOMY;  Surgeon: Thornton Park, MD;  [facility];  Service: Gastroenterology;;  . TEE WITHOUT CARDIOVERSION N/A 04/06/2012   Procedure: TRANSESOPHAGEAL ECHOCARDIOGRAM (TEE);  Surgeon: [name], MD;  Location: [facility];  Service: Cardiovascular;  Laterality: N/A;  . TRIGGER FINGER RELEASE Right    middle finger  . TRIGGER FINGER RELEASE Left 06/28/2016   Procedure: RELEASE TRIGGER FINGER LEFT 3RD FINGER;  Surgeon: [name], MD;  Location: [facility];  Service: Orthopedics;  Laterality: Left;  . TRIGGER FINGER RELEASE Right 06/12/2016   Procedure: RIGHT INDEX FINGER TRIGGER RELEASE;  Surgeon:  [name], MD;  Location: [facility];  Service: Orthopedics;  Laterality: Right;  . TRIGGER FINGER RELEASE Right 01/19/2018   Procedure: RIGHT RING FINGER TRIGGER FINGER RELEASE;  Surgeon: [name], MD;  Location: [facility];  Service: Orthopedics;  Laterality: Right;    Current Outpatient Medications  Medication Sig Dispense Refill  . ACCU-CHEK SOFTCLIX LANCETS lancets TEST 4 TIMES DAILY 200 each 6  . acetaminophen (TYLENOL) 500 MG tablet Take 1,000 mg by mouth 2 (two) times daily.     [name] Kitchen albuterol (VENTOLIN HFA) 108 (90 Base) MCG/ACT inhaler Inhale 2 puffs into the lungs every 6 (six) hours as needed for wheezing or shortness of breath. 18 g 3  . ammonium lactate (LAC-HYDRIN) 12 % lotion APPLY 1 APPLICATION TOPICALLY 2 (TWO) TIMES DAILY AS NEEDED FOR DRY SKIN. 400 mL 3  . atorvastatin (LIPITOR) 40 MG tablet TAKE 1 TABLET BY MOUTH EVERY DAY (Patient taking differently: Take 40 mg by mouth daily. ) 90 tablet 3  . b  complex vitamins capsule Take 1 capsule by mouth daily.    . baclofen (LIORESAL) 10 MG tablet Take 1 tablet (10 mg total) by mouth 3 (three) times daily as needed for muscle spasms. (Patient taking differently: Take 10 mg by mouth 3 (three) times daily. ) 90 each 0  . blood glucose meter kit and supplies KIT Dispense based on patient and insurance preference. Use up to four times daily as directed. (FOR ICD-9 250.00, 250.01). 1 each 0  . busPIRone (BUSPAR) 10 MG tablet TAKE 1 TABLET BY MOUTH THREE TIMES A DAY (Patient taking differently: Take 10 mg by mouth 3 (three) times daily. ) 270 tablet 4  . diclofenac Sodium (VOLTAREN) 1 % GEL Apply 2 g topically 4 (four) times daily. 500 g 1  . diltiazem (CARDIZEM CD) 120 MG 24 hr capsule Take 1 capsule (120 mg total) by mouth 2 (two) times daily. 180 capsule 3  . docusate sodium (COLACE) 100 MG capsule Take 200 mg by mouth 2 (two) times daily.    . empagliflozin (JARDIANCE) 25 MG TABS tablet Take 25 mg by mouth daily.    . febuxostat (ULORIC) 40 MG tablet Take 40 mg by mouth at bedtime.     . fluticasone (FLONASE) 50 MCG/ACT nasal spray Place 2 sprays into both nostrils 2 (two) times daily. 16 g 6  . Fluticasone-Umeclidin-Vilant (TRELEGY ELLIPTA) 100-62.5-25 MCG/INH AEPB Inhale 1 puff into the lungs daily. 448 each 3  . folic acid (FOLVITE) 185 MCG tablet Take 400 mcg by mouth daily.    . furosemide (LASIX) 40 MG tablet Take 2 tablets (80 mg total) by mouth 2 (two) times daily. 180 tablet 3  . gabapentin (NEURONTIN) 300 MG capsule TAKE 2 CAPSULES (600 MG TOTAL) BY MOUTH 3 (THREE) TIMES DAILY. 540 capsule 2  . glucose blood (ACCU-CHEK AVIVA PLUS) test strip TEST 4 TIMES DAILY (Patient taking differently: 1 each by Other route 3 (three) times daily. TEST 3 TIMES DAILY) 100 each 12  . HYDROcodone-acetaminophen (NORCO) 7.5-325 MG tablet Take 1 tablet by mouth every 6 (six) hours as needed for moderate pain. (Patient taking differently: Take 1 tablet by mouth in  the morning, at noon, in the evening, and at bedtime. ) 20 tablet 0  . hydrOXYzine (ATARAX/VISTARIL) 10 MG tablet TAKE 1 TABLET BY MOUTH EVERY DAY (Patient taking differently: Take 10 mg by mouth daily. ) 90 tablet 1  . Insulin Glargine (LANTUS SOLOSTAR) 100 UNIT/ML Solostar Pen Inject 22  Units into the skin daily before breakfast. (Patient taking differently: Inject 20 Units into the skin daily before breakfast. ) 15 mL   . insulin lispro (HUMALOG KWIKPEN) 100 UNIT/ML KwikPen Inject 0.08 mLs (8 Units total) into the skin 3 (three) times daily before meals.    . Insulin Pen Needle (B-D UF III MINI PEN NEEDLES) 31G X 5 MM MISC CHECK SUGARS 4 TIMES A DAY BEFORE MEALS AND AT BEDTIME. 100 each 12  . ketoconazole (NIZORAL) 2 % cream APPLY 1 FINGERTIP AMOUNT TO EACH FOOT DAILY. (Patient taking differently: Apply 1 application topically 2 (two) times daily. ) 30 g 0  . loratadine (CLARITIN) 10 MG tablet TAKE 1 TABLET BY MOUTH EVERY DAY (Patient taking differently: Take 10 mg by mouth daily. ) 90 tablet 0  . losartan (COZAAR) 100 MG tablet Take 1 tablet (100 mg total) by mouth daily. 90 tablet 3  . metolazone (ZAROXOLYN) 2.5 MG tablet Take 1 tablet (2.5 mg total) by mouth 2 (two) times a week. TAKE 1 TABLET TWICE WEEKLY ON MONDAYS AND THURSDAYS ONLY (Patient taking differently: Take 2.5 mg by mouth See admin instructions. TAKE 2.5 BY MOUTH  TWICE WEEKLY ON MONDAYS AND THURSDAYS ONLY) 15 tablet 6  . montelukast (SINGULAIR) 10 MG tablet TAKE 1 TABLET BY MOUTH EVERYDAY AT BEDTIME (Patient taking differently: Take 10 mg by mouth at bedtime. ) 90 tablet 3  . Multiple Vitamins-Minerals (MULTIVITAMIN WITH MINERALS) tablet Take 1 tablet by mouth daily.    . mupirocin ointment (BACTROBAN) 2 % APPLY TO AFFECTED AREA TWICE A DAY (Patient taking differently: Apply 1 application topically 2 (two) times daily as needed (skin fold (stomach)). ) 22 g 0  . NARCAN 4 MG/0.1ML LIQD nasal spray kit Place 1 spray into the nose as  needed (accidental overdose).     . nicotine polacrilex (NICORETTE) 4 MG gum CHEW 1 EACH (4 MG TOTAL) BY MOUTH AS NEEDED FOR SMOKING CESSATION. (Patient taking differently: Take 4 mg by mouth as needed for smoking cessation. ) 100 each 2  . NON FORMULARY Uses a C-PAP at bedtime    . nystatin (MYCOSTATIN) 100000 UNIT/ML suspension Take 5 mLs (500,000 Units total) by mouth 4 (four) times daily. Swish and swallow. (Patient taking differently: Take 5 mLs by mouth 4 (four) times daily as needed (dry mouth). Swish and swallow.) 60 mL 3  . Omega-3 Fatty Acids (FISH OIL) 1000 MG CAPS Take 1,000 mg by mouth daily.     [name] Kitchen omeprazole (PRILOSEC) 20 MG capsule Take 1 capsule (20 mg total) by mouth daily. 90 capsule 3  . OXYGEN Inhale 2 L into the lungs continuous.     . potassium chloride SA (KLOR-CON M20) 20 MEQ tablet Take 1 tablet (20 mEq total) by mouth 2 (two) times a week. Take 1 tablet twice weekly on Monday and Thursday. (Patient taking differently: Take 20 mEq by mouth See admin instructions. Take 20 mEq by mouth twice weekly on Monday and Thursday.) 15 tablet 6  . promethazine (PHENERGAN) 25 MG tablet Take 1 tablet (25 mg total) by mouth every 6 (six) hours as needed for nausea. 30 tablet 1  . sertraline (ZOLOFT) 50 MG tablet TAKE 1 TABLET BY MOUTH EVERY DAY (Patient taking differently: Take 50 mg by mouth daily. ) 90 tablet 1  . varenicline (CHANTIX) 0.5 MG tablet Take 1 tablet (0.5 mg total) by mouth 2 (two) times daily. 60 tablet 5   No current facility-administered medications for this visit.  Facility-Administered Medications Ordered in Other Visits  Medication Dose Route Frequency Provider Last Rate Last Admin  . technetium tetrofosmin (TC-MYOVIEW) injection 34.2 millicurie  87.6 millicurie Intravenous Once PRN Sueanne Margarita, MD        Allergies as of 04/16/2019 - Review Complete 04/16/2019  Allergen Reaction Noted  . Aspirin Other (See Comments) 01/27/2018  . Chantix [varenicline  tartrate] Other (See Comments) 09/01/2017  . Methadone hcl Other (See Comments) 08/19/2006  . Corticosteroids Other (See Comments) 01/16/2018  . Levofloxacin Itching 08/20/2007    Family History  Problem Relation Age of Onset  . Pulmonary embolism Mother   . Stroke Father   . Alcohol abuse Father   . Pneumonia Father   . Hypertension Father   . Breast cancer Maternal Aunt   . Hypertension Other   . Diabetes Other        Aunts and cousins  . Asthma Sister   . Depression Sister   . Arthritis Sister   . Sickle cell anemia Son     Social History   Socioeconomic History  . Marital status: Single    Spouse name: Not on file  . Number of children: 1  . Years of education: 47  . Highest education level: 12th grade  Occupational History  . Occupation: disabled    Comment: Disabled  Tobacco Use  . Smoking status: Current Every Day Smoker    Packs/day: 0.20    Years: 40.00    Pack years: 8.00    Types: Cigarettes    Start date: 01/28/1977  . Smokeless tobacco: Never Used  . Tobacco comment: Quit 8 months 2019 - restart 05/2018   Hx 1.5 PPD  Substance and Sexual Activity  . Alcohol use: No    Alcohol/week: 0.0 standard drinks  . Drug use: Not Currently    Types: Marijuana, "Crack" cocaine    Comment: hx marijuana usemany years ago, Crack -none since 11/2015  . Sexual activity: Not Currently    Partners: Male    Birth control/protection: Post-menopausal  Other Topics Concern  . Not on file  Social History Narrative   Current Social History       Who lives at home: Patient lives alone in one level home; has 4 steps onto porch with a handrail. Has smoke alarms, no throw rugs. Has elevated toilet. Has shower bench in tub with grab rails.    Transportation: Patient has own vehicle and drives herself    Important Relationships "Family and friends"    Pets: None    Likes to eat varied diet. Seafood, fish, roasted [place] breast, vegetables and salads and fruits.   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 I'm a lovable person."  Social Determinants of Health   Financial Resource Strain:   . Difficulty of Paying Living Expenses:   Food Insecurity:   . Worried About Charity fundraiser in the Last Year:   . Arboriculturist in the Last Year:   Transportation Needs:   . Film/video editor (Medical):   [name] Kitchen Lack of Transportation (Non-Medical):   Physical Activity:   . Days of Exercise per Week:   . Minutes of Exercise per Session:   Stress:   . Feeling of Stress :   Social Connections:   . Frequency of Communication with Friends and Family:   . Frequency of Social Gatherings with Friends and Family:   . Attends Religious Services:   . Active Member of Clubs or Organizations:   . Attends Archivist Meetings:   [name] Kitchen Marital Status:   Intimate Partner Violence:   . Fear of Current or Ex-Partner:   . Emotionally Abused:   [name] Kitchen Physically Abused:   . Sexually Abused:      Physical Exam: General:   Alert,  well-nourished, pleasant and cooperative in NAD. Sitting in a wheelchair.  Head:  Normocephalic and atraumatic. Eyes:  Sclera clear, no icterus.   Conjunctiva pink. Ears:  Normal auditory acuity. Nose:  No deformity, discharge,  or lesions. Abdomen:  Morbid obesity. Soft, nontender, nondistended, normal bowel sounds, no rebound or guarding. No hepatosplenomegaly.   Neurologic:  Alert and  oriented x4;  grossly nonfocal Skin: No rash or bruise. Psych:  Alert and cooperative. Normal mood and affect.      L. Tarri Glenn, MD, MPH 04/16/2019, 10:45 AM

## 2019-04-16 NOTE — Patient Instructions (Signed)
Use a daily stool bulking agent such as psyllium or methylcullose.   You will be due for a recall colonoscopy in 02/2022. We will send you a reminder in the mail when it gets closer to that time.  I value your feedback and thank you for entrusting [place] with your care. If you get a [facility] patient survey, I would appreciate you taking the time to let us know about your experience today. Thank you!   Due to recent changes in healthcare laws, you may see the results of your imaging and laboratory studies on MyChart before your provider has had a chance to review them.  We understand that in some cases there may be results that are confusing or concerning to you. Not all laboratory results come back in the same time frame and the provider may be waiting for multiple results in order to interpret others.  Please give [place] 48 hours in order for your provider to thoroughly review all the results before contacting the office for clarification of your results.

## 2019-04-17 ENCOUNTER — Other Ambulatory Visit: Payer: Self-pay | Admitting: Cardiology

## 2019-04-17 DIAGNOSIS — I5022 Chronic systolic (congestive) heart failure: Secondary | ICD-10-CM

## 2019-04-17 DIAGNOSIS — E8770 Fluid overload, unspecified: Secondary | ICD-10-CM

## 2019-04-17 DIAGNOSIS — R600 Localized edema: Secondary | ICD-10-CM

## 2019-04-18 ENCOUNTER — Other Ambulatory Visit: Payer: Self-pay | Admitting: Family Medicine

## 2019-04-19 ENCOUNTER — Encounter: Payer: Self-pay | Admitting: Orthopaedic Surgery

## 2019-04-26 ENCOUNTER — Ambulatory Visit: Payer: Medicare Other | Admitting: Adult Health

## 2019-04-26 ENCOUNTER — Encounter: Payer: Self-pay | Admitting: Family Medicine

## 2019-04-26 DIAGNOSIS — N183 Chronic kidney disease, stage 3 unspecified: Secondary | ICD-10-CM

## 2019-04-26 DIAGNOSIS — Z794 Long term (current) use of insulin: Secondary | ICD-10-CM

## 2019-04-26 DIAGNOSIS — E1122 Type 2 diabetes mellitus with diabetic chronic kidney disease: Secondary | ICD-10-CM

## 2019-04-26 NOTE — Progress Notes (Signed)
[id number] [place], [place] [address] [place] [address] Phone: [phone] Fax: [phone]    Your procedure is scheduled on Friday, April 2nd.  Report to [facility] Main Entrance "A" at 10:15 A.M., and check in at the Admitting office.  Call this number if you have problems the morning of surgery:  [phone]  Call [phone] if you have any questions prior to your surgery date Monday-Friday 8am-4pm   Remember:  Do not eat after midnight the night before your surgery  You may drink clear liquids until 9:15 the morning of your surgery.   Clear liquids allowed are: Water, Non-Citrus Juices (without pulp), Carbonated Beverages, Clear Tea, Black Coffee Only, and Gatorade    Enhanced Recovery after Surgery for Orthopedics Enhanced Recovery after Surgery is a protocol used to improve the stress on your body and your recovery after surgery.  Patient Instructions  . The night before surgery:  o No food after midnight. ONLY clear liquids after midnight  . The day of surgery (if you have diabetes): o  o Drink ONE (1) 10oz bottle of water by 9:15 A.M. the morning of surgery. Please if possible, drink in one setting, DO NOT SIP. o This drink was given to you during your hospital  pre-op appointment visit.  o Nothing else to drink after completing the  Gatorade 2 (G2).         If you have questions, please contact your surgeon's office.   Take these medicines the morning of surgery with A SIP OF WATER  acetaminophen (TYLENOL) atorvastatin (LIPITOR)  baclofen (LIORESAL) busPIRone (BUSPAR)  diltiazem (CARDIZEM CD)  docusate sodium (COLACE)  busPIRone (BUSPAR)  gabapentin (NEURONTIN) hydrOXYzine (ATARAX/VISTARIL) loratadine (CLARITIN)  omeprazole (PRILOSEC) sertraline (ZOLOFT)  varenicline (CHANTIX)  If needed - albuterol (VENTOLIN HFA)/inhaler,  fluticasone (FLONASE)/nasal spray,  Fluticasone-Umeclidin-Vilant (TRELEGY ELLIPTA)/inhaler, HYDROcodone-acetaminophen (NORCO), promethazine (PHENERGAN),      As of today, stop taking all Aspirin (unless instructed by your doctor) and other Aspirin containing products, diclofenac Sodium (VOLTAREN), Vitamins, Fish Oils, and Herbal Medications. Also stop all NSAIDS i.e. Advil, Ibuprofen, Motrin, Aleve, Anaprox, Naproxen, BC, Goody Powders, and all Supplements.  WHAT DO I DO ABOUT MY DIABETES MEDICATION?  [name] Kitchen Do not take empagliflozin (JARDIANCE)/oral diabetes medicines (pills) the morning of surgery.   . THE MORNING OF SURGERY, take 10  units of Insulin Glargine (LANTUS SOLOSTAR) insulin.   HOW TO MANAGE YOUR DIABETES BEFORE AND AFTER SURGERY  Why is it important to control my blood sugar before and after surgery? . Improving blood sugar levels before and after surgery helps healing and can limit problems. . A way of improving blood sugar control is eating a healthy diet by: o  Eating less sugar and carbohydrates o  Increasing activity/exercise o  Talking with your doctor about reaching your blood sugar goals . High blood sugars (greater than 180 mg/dL) can raise your risk of infections and slow your recovery, so you will need to focus on controlling your diabetes during the weeks before surgery. . Make sure that the doctor who takes care of your diabetes knows about your planned surgery including the date and location.  How do I manage my blood sugar before surgery? . Check your blood sugar at least 4 times a day, starting 2 days before surgery, to make sure that the level is not too high or low. . Check your blood sugar the morning of your surgery when  you wake up and every 2 hours until you get to the Short Stay unit. o If your blood sugar is less than 70 mg/dL, you will need to treat for low blood sugar: - Do not take insulin. - Treat a low blood sugar (less than 70 mg/dL) with  cup of clear juice (cranberry or apple), 4  glucose tablets, OR glucose gel. - Recheck blood sugar in 15 minutes after treatment (to make sure it is greater than 70 mg/dL). If your blood sugar is not greater than 70 mg/dL on recheck, call [phone] for further instructions. . Report your blood sugar to the short stay nurse when you get to Short Stay.  . If you are admitted to the hospital after surgery: o Your blood sugar will be checked by the staff and you will probably be given insulin after surgery (instead of oral diabetes medicines) to make sure you have good blood sugar levels. o The goal for blood sugar control after surgery is 80-180 mg/dL.   No Smoking of any kind, Tobacco, or Alcohol products 24 hours prior to your procedure. If you use a CPAP at night, you may bring all equipment for your overnight stay.                        Do not wear jewelry, make up, or nail polish            Do not wear lotions, powders, perfumes/colognes, or deodorant.            Do not shave 48 hours prior to surgery.  Men may shave face and neck.            Do not bring valuables to the hospital.            [facility] is not responsible for any belongings or valuables.   Contacts, glasses, dentures or bridgework may not be worn into surgery.      For patients admitted to the hospital, discharge time will be determined by your treatment team.   Patients discharged the day of surgery will not be allowed to drive home, and someone needs to stay with them for 24 hours.  Special instructions:   [facility]- Preparing For Surgery  Before surgery, you can play an important role. Because skin is not sterile, your skin needs to be as free of germs as possible. You can reduce the number of germs on your skin by washing with CHG (chlorahexidine gluconate) Soap before surgery.  CHG is an antiseptic cleaner which kills germs and bonds with the skin to continue killing germs even after washing.    Oral Hygiene is also important to reduce your risk of  infection.  Remember - BRUSH YOUR TEETH THE MORNING OF SURGERY WITH YOUR REGULAR TOOTHPASTE  Please do not use if you have an allergy to CHG or antibacterial soaps. If your skin becomes reddened/irritated stop using the CHG.  Do not shave (including legs and underarms) for at least 48 hours prior to first CHG shower. It is OK to shave your face.  Please follow these instructions carefully.   1. Shower the NIGHT BEFORE SURGERY and the MORNING OF SURGERY with CHG Soap.   2. If you chose to wash your hair, wash your hair first as usual with your normal shampoo.  3. After you shampoo, rinse your hair and body thoroughly to remove the shampoo.  4. Use CHG as you would any other liquid soap.  You can apply CHG directly to the skin and wash gently with a scrungie or a clean washcloth.   5. Apply the CHG Soap to your body ONLY FROM THE NECK DOWN.  Do not use on open wounds or open sores. Avoid contact with your eyes, ears, mouth and genitals (private parts). Wash Face and genitals (private parts)  with your normal soap.   6. Wash thoroughly, paying special attention to the area where your surgery will be performed.  7. Thoroughly rinse your body with warm water from the neck down.  8. DO NOT shower/wash with your normal soap after using and rinsing off the CHG Soap.  9. Pat yourself dry with a CLEAN TOWEL.  10. Wear CLEAN PAJAMAS to bed the night before surgery, wear comfortable clothes the morning of surgery  11. Place CLEAN SHEETS on your bed the night of your first shower and DO NOT SLEEP WITH PETS.  Day of Surgery:  Do not apply any deodorants/lotions.  Please wear clean clothes to the hospital/surgery center.   Remember to brush your teeth WITH YOUR REGULAR TOOTHPASTE.   Please read over the following fact sheets that you were given.

## 2019-04-27 ENCOUNTER — Encounter (HOSPITAL_COMMUNITY)
Admission: RE | Admit: 2019-04-27 | Discharge: 2019-04-27 | Disposition: A | Discharge status: Home / Self Care | Payer: Medicare Other | Source: Ambulatory Visit | Attending: Orthopaedic Surgery | Admitting: Orthopaedic Surgery

## 2019-04-27 ENCOUNTER — Other Ambulatory Visit: Payer: Self-pay

## 2019-04-27 ENCOUNTER — Encounter (HOSPITAL_COMMUNITY): Payer: Self-pay

## 2019-04-27 DIAGNOSIS — E119 Type 2 diabetes mellitus without complications: Secondary | ICD-10-CM | POA: Diagnosis not present

## 2019-04-27 DIAGNOSIS — Z01812 Encounter for preprocedural laboratory examination: Secondary | ICD-10-CM | POA: Insufficient documentation

## 2019-04-27 LAB — CBC
HCT: 48.1 % — ABNORMAL HIGH (ref 36.0–46.0)
Hemoglobin: 15.8 g/dL — ABNORMAL HIGH (ref 12.0–15.0)
MCH: 27.2 pg (ref 26.0–34.0)
MCHC: 32.8 g/dL (ref 30.0–36.0)
MCV: 82.9 fL (ref 80.0–100.0)
Platelets: 325 10*3/uL (ref 150–400)
RBC: 5.8 MIL/uL — ABNORMAL HIGH (ref 3.87–5.11)
RDW: 14.3 % (ref 11.5–15.5)
WBC: 10.5 10*3/uL (ref 4.0–10.5)
nRBC: 0 % (ref 0.0–0.2)

## 2019-04-27 LAB — GLUCOSE, CAPILLARY: Glucose-Capillary: 207 mg/dL — ABNORMAL HIGH (ref 70–99)

## 2019-04-27 LAB — COMPREHENSIVE METABOLIC PANEL
ALT: 32 U/L (ref 0–44)
AST: 27 U/L (ref 15–41)
Albumin: 3.8 g/dL (ref 3.5–5.0)
Alkaline Phosphatase: 79 U/L (ref 38–126)
Anion gap: 13 (ref 5–15)
BUN: 23 mg/dL — ABNORMAL HIGH (ref 6–20)
CO2: 28 mmol/L (ref 22–32)
Calcium: 9.7 mg/dL (ref 8.9–10.3)
Chloride: 100 mmol/L (ref 98–111)
Creatinine, Ser: 1.32 mg/dL — ABNORMAL HIGH (ref 0.44–1.00)
GFR calc Af Amer: 53 mL/min — ABNORMAL LOW (ref >60)
GFR calc non Af Amer: 45 mL/min — ABNORMAL LOW (ref >60)
Glucose, Bld: 163 mg/dL — ABNORMAL HIGH (ref 70–99)
Potassium: 3.5 mmol/L (ref 3.5–5.1)
Sodium: 141 mmol/L (ref 135–145)
Total Bilirubin: 0.9 mg/dL (ref 0.3–1.2)
Total Protein: 7 g/dL (ref 6.5–8.1)

## 2019-04-27 MED ORDER — ACCU-CHEK SOFTCLIX LANCETS MISC: 6 refills | Status: DC

## 2019-04-27 NOTE — Telephone Encounter (Signed)
Late note entry  Spoke with patient on 04/26/2019 regarding her glucose supply get.  She reports she needed lancets to check her blood sugar.  She had no other requests at that time.  Prescription sent.

## 2019-04-27 NOTE — Progress Notes (Signed)
[id number] [place], [place] [address] [place] [address] Phone: [phone] Fax: [phone]    Your procedure is scheduled on Friday, April 9th.  Report to [facility] Main Entrance "A" at 10:15 A.M., and check in at the Admitting office.  Call this number if you have problems the morning of surgery:  [phone]  Call [phone] if you have any questions prior to your surgery date Monday-Friday 8am-4pm   Remember:  Do not eat after midnight the night before your surgery  You may drink clear liquids until 9:15 the morning of your surgery.   Clear liquids allowed are: Water, Non-Citrus Juices (without pulp), Carbonated Beverages, Clear Tea, Black Coffee Only, and Gatorade    Enhanced Recovery after Surgery for Orthopedics Enhanced Recovery after Surgery is a protocol used to improve the stress on your body and your recovery after surgery.  Patient Instructions  . The night before surgery:  o No food after midnight. ONLY clear liquids after midnight  . The day of surgery (if you have diabetes): o  o Drink ONE (1) 10oz bottle of water by 9:15 A.M. the morning of surgery. Please if possible, drink in one setting, DO NOT SIP. o This drink was given to you during your hospital  pre-op appointment visit.  o Nothing else to drink after completing the  Gatorade 2 (G2).         If you have questions, please contact your surgeon's office.   Take these medicines the morning of surgery with A SIP OF WATER  acetaminophen (TYLENOL) atorvastatin (LIPITOR)  baclofen (LIORESAL) busPIRone (BUSPAR)  diltiazem (CARDIZEM CD)  docusate sodium (COLACE)  busPIRone (BUSPAR)  gabapentin (NEURONTIN) hydrOXYzine (ATARAX/VISTARIL) loratadine (CLARITIN)  omeprazole (PRILOSEC) sertraline (ZOLOFT)  varenicline (CHANTIX)  If needed - albuterol (VENTOLIN HFA)/inhaler,  fluticasone (FLONASE)/nasal spray,  Fluticasone-Umeclidin-Vilant (TRELEGY ELLIPTA)/inhaler, HYDROcodone-acetaminophen (NORCO), promethazine (PHENERGAN),      As of today, stop taking all Aspirin (unless instructed by your doctor) and other Aspirin containing products, diclofenac Sodium (VOLTAREN), Vitamins, Fish Oils, and Herbal Medications. Also stop all NSAIDS i.e. Advil, Ibuprofen, Motrin, Aleve, Anaprox, Naproxen, BC, Goody Powders, and all Supplements.  WHAT DO I DO ABOUT MY DIABETES MEDICATION?  [name] Kitchen Do not take empagliflozin (JARDIANCE)/oral diabetes medicines (pills) the morning of surgery.   . THE MORNING OF SURGERY, take 10  units of Insulin Glargine (LANTUS SOLOSTAR) insulin.   HOW TO MANAGE YOUR DIABETES BEFORE AND AFTER SURGERY  Why is it important to control my blood sugar before and after surgery? . Improving blood sugar levels before and after surgery helps healing and can limit problems. . A way of improving blood sugar control is eating a healthy diet by: o  Eating less sugar and carbohydrates o  Increasing activity/exercise o  Talking with your doctor about reaching your blood sugar goals . High blood sugars (greater than 180 mg/dL) can raise your risk of infections and slow your recovery, so you will need to focus on controlling your diabetes during the weeks before surgery. . Make sure that the doctor who takes care of your diabetes knows about your planned surgery including the date and location.  How do I manage my blood sugar before surgery? . Check your blood sugar at least 4 times a day, starting 2 days before surgery, to make sure that the level is not too high or low. . Check your blood sugar the morning of your surgery when  you wake up and every 2 hours until you get to the Short Stay unit. o If your blood sugar is less than 70 mg/dL, you will need to treat for low blood sugar: - Do not take insulin. - Treat a low blood sugar (less than 70 mg/dL) with  cup of clear juice (cranberry or apple), 4  glucose tablets, OR glucose gel. - Recheck blood sugar in 15 minutes after treatment (to make sure it is greater than 70 mg/dL). If your blood sugar is not greater than 70 mg/dL on recheck, call [phone] for further instructions. . Report your blood sugar to the short stay nurse when you get to Short Stay.  . If you are admitted to the hospital after surgery: o Your blood sugar will be checked by the staff and you will probably be given insulin after surgery (instead of oral diabetes medicines) to make sure you have good blood sugar levels. o The goal for blood sugar control after surgery is 80-180 mg/dL.   No Smoking of any kind, Tobacco, or Alcohol products 24 hours prior to your procedure. If you use a CPAP at night, you may bring all equipment for your overnight stay.                        Do not wear jewelry, make up, or nail polish            Do not wear lotions, powders, perfumes/colognes, or deodorant.            Do not shave 48 hours prior to surgery.  Men may shave face and neck.            Do not bring valuables to the hospital.            [facility] is not responsible for any belongings or valuables.   Contacts, glasses, dentures or bridgework may not be worn into surgery.      For patients admitted to the hospital, discharge time will be determined by your treatment team.   Patients discharged the day of surgery will not be allowed to drive home, and someone needs to stay with them for 24 hours.  Special instructions:   [facility]- Preparing For Surgery  Before surgery, you can play an important role. Because skin is not sterile, your skin needs to be as free of germs as possible. You can reduce the number of germs on your skin by washing with CHG (chlorahexidine gluconate) Soap before surgery.  CHG is an antiseptic cleaner which kills germs and bonds with the skin to continue killing germs even after washing.    Oral Hygiene is also important to reduce your risk of  infection.  Remember - BRUSH YOUR TEETH THE MORNING OF SURGERY WITH YOUR REGULAR TOOTHPASTE  Please do not use if you have an allergy to CHG or antibacterial soaps. If your skin becomes reddened/irritated stop using the CHG.  Do not shave (including legs and underarms) for at least 48 hours prior to first CHG shower. It is OK to shave your face.  Please follow these instructions carefully.   1. Shower the NIGHT BEFORE SURGERY and the MORNING OF SURGERY with CHG Soap.   2. If you chose to wash your hair, wash your hair first as usual with your normal shampoo.  3. After you shampoo, rinse your hair and body thoroughly to remove the shampoo.  4. Use CHG as you would any other liquid soap.  You can apply CHG directly to the skin and wash gently with a scrungie or a clean washcloth.   5. Apply the CHG Soap to your body ONLY FROM THE NECK DOWN.  Do not use on open wounds or open sores. Avoid contact with your eyes, ears, mouth and genitals (private parts). Wash Face and genitals (private parts)  with your normal soap.   6. Wash thoroughly, paying special attention to the area where your surgery will be performed.  7. Thoroughly rinse your body with warm water from the neck down.  8. DO NOT shower/wash with your normal soap after using and rinsing off the CHG Soap.  9. Pat yourself dry with a CLEAN TOWEL.  10. Wear CLEAN PAJAMAS to bed the night before surgery, wear comfortable clothes the morning of surgery  11. Place CLEAN SHEETS on your bed the night of your first shower and DO NOT SLEEP WITH PETS.  Day of Surgery:  Do not apply any deodorants/lotions.  Please wear clean clothes to the hospital/surgery center.   Remember to brush your teeth WITH YOUR REGULAR TOOTHPASTE.   Please read over the following fact sheets that you were given.

## 2019-04-27 NOTE — Progress Notes (Signed)
PCP:  [name], MD Cardiologist:  [name], MD  EKG:  09/24/18 CXR:  N/A ECHO:  10/01/17 Stress Test:  04/09/19 Cardiac Cath:  Denies  Fasting Blood Sugar-  176-227 Checks Blood Sugar_3__ times a day  Anesthesia Review:  Yes, Cardiac History and uses 3L 02 continuous.  BiPap at night.   Covid test 05/04/19  Patient denies shortness of breath, fever, cough, and chest pain at PAT appointment.  Patient verbalized understanding of instructions provided today at the PAT appointment.  Patient asked to review instructions at home and day of surgery.

## 2019-04-28 NOTE — Anesthesia Preprocedure Evaluation (Addendum)
Anesthesia Evaluation  Patient identified by MRN, date of birth, ID band Patient awake    Reviewed: Allergy & Precautions, NPO status , Patient's Chart, lab work & pertinent test results  Airway Mallampati: III  TM Distance: >3 FB Neck ROM: Full    Dental no notable dental hx. (+) Dental Advisory Given, Edentulous Upper, Poor Dentition, Missing,    Pulmonary shortness of breath, with exertion, at rest and Long-Term Oxygen Therapy, asthma , sleep apnea, Continuous Positive Airway Pressure Ventilation and Oxygen sleep apnea , COPD,  COPD inhaler and oxygen dependent, Current Smoker and Patient abstained from smoking.,  Still smoking about 4cigg/d  OSA- on BiPAP, O2 2-3lpm [place]   Pulmonary exam normal breath sounds clear to auscultation       Cardiovascular hypertension, Pt. on medications +CHF  Normal cardiovascular exam+ dysrhythmias Supra Ventricular Tachycardia  Rhythm:Regular Rate:Normal  HLD  Last stress test 03/2019:  The left ventricular ejection fraction is mildly decreased (45-54%).  Nuclear stress EF: 47%.  There was no ST segment deviation noted during stress.  This is an intermediate risk study due to reduced systolic function. There is no ischemia.  New wall motion abnormalities noted. Consider echo if clinically warranted.    Last echo 2019: Left ventricle: The cavity size was normal. Wall thickness was  normal. Systolic function was normal. The estimated ejection  fraction was in the range of 55% to 60%. Wall motion was normal;  there were no regional wall motion abnormalities. Doppler  parameters are consistent with abnormal left ventricular  relaxation (grade 1 diastolic dysfunction).    Neuro/Psych  Headaches, PSYCHIATRIC DISORDERS Anxiety Depression Bipolar Disorder    GI/Hepatic GERD  Controlled and Medicated,(+)     substance abuse  alcohol use, Hepatitis -, BHx crack cocaine use, none  since 2017   Endo/Other  diabetes, Type 2, Insulin Dependent, Oral Hypoglycemic AgentsMorbid obesityBMI 53  10 units lantus this AM, FS 151  Renal/GU Renal Insufficiency and CRFRenal diseaseCr 1.32, CKD 3   Hx cervical ca    Musculoskeletal  (+) Arthritis , Osteoarthritis,  Left shoulder rotator cuff tear, left index trigger finger   Abdominal (+) + obese,   Peds  Hematology negative hematology ROS (+) Polycythemia- hct 48   Anesthesia Other Findings   Reproductive/Obstetrics negative OB ROS                                                           Anesthesia Evaluation  Patient identified by MRN, date of birth, ID band Patient awake    Reviewed: Allergy & Precautions, NPO status , Patient's Chart, lab work & pertinent test results  Airway Mallampati: III  TM Distance: >3 FB Neck ROM: Limited    Dental no notable dental hx.    Pulmonary asthma , sleep apnea and Continuous Positive Airway Pressure Ventilation , Current Smoker and Patient abstained from smoking.,    Pulmonary exam normal breath sounds clear to auscultation       Cardiovascular hypertension, Normal cardiovascular exam+ dysrhythmias  Rhythm:Regular Rate:Normal  Left ventricle: The cavity size was normal. Wall thickness was  normal. Systolic function was normal. The estimated ejection  fraction was in the range of 55% to 60%. Wall motion was normal;  there were no regional wall motion abnormalities. Doppler  parameters are consistent  with abnormal left ventricular  relaxation (grade 1 diastolic dysfunction).    Neuro/Psych negative neurological ROS  negative psych ROS   GI/Hepatic negative GI ROS,   Endo/Other  diabetesMorbid obesity  Renal/GU Renal InsufficiencyRenal disease  negative genitourinary   Musculoskeletal negative musculoskeletal ROS (+)   Abdominal (+) + obese,   Peds negative pediatric ROS (+)  Hematology negative hematology  ROS (+)   Anesthesia Other Findings   Reproductive/Obstetrics negative OB ROS                            Anesthesia Physical Anesthesia Plan  ASA: IV  Anesthesia Plan: MAC   Post-op Pain Management:    Induction: Intravenous  PONV Risk Score and Plan: 0  Airway Management Planned: Simple Face Mask  Additional Equipment:   Intra-op Plan:   Post-operative Plan:   Informed Consent: I have reviewed the patients History and Physical, chart, labs and discussed the procedure including the risks, benefits and alternatives for the proposed anesthesia with the patient or authorized representative who has indicated his/her understanding and acceptance.     Dental advisory given  Plan Discussed with: CRNA and Surgeon  Anesthesia Plan Comments:         Anesthesia Quick Evaluation  Anesthesia Physical Anesthesia Plan  ASA: IV  Anesthesia Plan: General   Post-op Pain Management:    Induction: Intravenous  PONV Risk Score and Plan: 2 and Ondansetron, Dexamethasone and Treatment may vary due to age or medical condition  Airway Management Planned: Oral ETT and Video Laryngoscope Planned  Additional Equipment: None  Intra-op Plan:   Post-operative Plan: Extubation in OR  Informed Consent: I have reviewed the patients History and Physical, chart, labs and discussed the procedure including the risks, benefits and alternatives for the proposed anesthesia with the patient or authorized representative who has indicated his/her understanding and acceptance.     Dental advisory given  Plan Discussed with: CRNA  Anesthesia Plan Comments: (Unlikely to tolerate the phrenic nerve blockade associated with interscalene nerve block due to chronic respiratory failure. Will treat pain with IV and PO analgesics instead.)      Anesthesia Quick Evaluation

## 2019-04-28 NOTE — Progress Notes (Signed)
Anesthesia Chart Review:  Case: [id number] Date/Time: 05/07/19 1200   Procedures:      LEFT SHOULDER ARTHROSCOPY WITH DEBRIDEMENT, DISTAL CLAVICLE EXCISION,  SUBACROMIAL DECOMPRESSION AND POSSIBLE ROTATOR CUFF REPAIR (Left Shoulder)     RELEASE TRIGGER FINGER LEFT INDEX FINGER (Left )   Anesthesia type: General   Pre-op diagnosis: left shoulder rotator cuff tear, left index finger trigger finger   Location: [facility] OR ROOM 04 / [place] [facility]   Surgeons: [name], MD      DISCUSSION: DISCUSSION: Patient is a 54 year old [place] scheduled for the above procedure. Surgery was initially scheduled for 04/05/19, but delayed because she had not had a stress test as recommended. Since then she had the stress test with cardiology preoperative evaluation (see below).  History includes smoking (down to 4 cig/day 04/14/19), HLD, asthma, HTN, OSA (BiPAP and O2 at night), COPD (2-3L/[place]), chronic diastolic heart failure, PSVT, incomplete LBBB, DM2, GERD, CKD (stage III), Bipolar disorder, exertional dyspnea, Hepatitis B, polysubstance abuse (history of: no crack since 2017; no current alcohol or marijuana use documented), lung nodule (stable RUL nodule 03/2019, repeat CT planned 02/2020 per Dr. Valeta Harms). BMI is consistent with morbid obesity.  Seen by PCP Cresenzo on 03/22/19 for DM follow-up in preparation for shoulder shoulder. A1c had been 8.5% 3 months ago, but now down to 7.4% and surgery scheduled.    Last evaluation by pulmonlogist Dr. Valeta Harms on 03/03/19. He was aware of possible upcoming shoulder and colonoscopy procedures. She was still smoking, but decreased amount. Using Bipap, 2L O2.   Last evaluation by cardiologist Dr. Harrell Gave on 04/14/19 for stress test follow-up and preoperative evaluation. She wrote,  "Preoperative cardiovascular exam, review of stress test results: she has had 2 day nuclear stress test as she cannot achieve >4 METs of activity. I personally reviewed this study. There is no evidence of  ischemia. There is some question of low normal EF and wall motion, but on my review of the study, I cannot exclude this being artifact. Without evidence of ischemia, she is acceptable risk for surgery. -Recommend close respiratory monitoring given her OSA and chronic obesity hypoventilation syndrome."  Presurgical COVID-19 test is scheduled for 05/04/19. Anesthesia team to evaluate on the day of surgery.    VS: BP (!) 154/86   Pulse 85   Temp 36.9 C (Oral)   Resp 18   Ht '5\' 10"'  (1.778 m)   Wt (!) 167.3 kg   LMP 04/10/2006   SpO2 100%   BMI 52.93 kg/m    PROVIDERS: [name], MD is PCP - [name], MD is cardiologist. Last evaluation 04/14/19.  June Leap, MD is pulmonologist. Last evaluation 03/04/19.  Lawson Radar, MD is nephrologist ([facility]) - Thornton Park, MD is GI. Last evaluation 04/16/19 for rectal fullness. Had colonoscopy 03/09/19. Stool bulking agent and pelvic floor recommended with follow-up if no improvement. Surveillance colonoscopy in 3 years.  Star Age, MD is neurologist. Visit 11/30/18 for posterior neck and right shoulder pain.     LABS: Labs reviewed: Acceptable for surgery.  A1c 7.4% 03/22/19. (all labs ordered are listed, but only abnormal results are displayed)  Labs Reviewed  GLUCOSE, CAPILLARY - Abnormal; Notable for the following components:      Result Value   Glucose-Capillary 207 (*)    All other components within normal limits  CBC - Abnormal; Notable for the following components:   RBC 5.80 (*)    Hemoglobin 15.8 (*)  HCT 48.1 (*)    All other components within normal limits  COMPREHENSIVE METABOLIC PANEL - Abnormal; Notable for the following components:   Glucose, Bld 163 (*)    BUN 23 (*)    Creatinine, Ser 1.32 (*)    GFR calc non Af Amer 45 (*)    GFR calc Af Amer 53 (*)    All other components within normal limits    PFTs 08/18/18 (Post-BD): FVC of 2.29 L, 65% predicted, FEV1,  1.Mary L, 57% predicted, ERV 46%, DLCO 49% predicted.     IMAGES: CT Super D Chest 03/03/19 IMPRESSION: 1. 7 mm right upper lobe nodule is stable from 04/21/2018 but appears minimally enlarged from 10/01/2017. Malignancy cannot be excluded. Additional follow-up in 1 year is recommended. 2. Basilar peripheral ground-glass opacities may be due to expiratory phase imaging. COVID-19 pneumonia can also have this appearance. 3. Hepatic steatosis. 4. Coronary artery calcification. 5. Enlarged pulmonic trunk, indicative of pulmonary arterial hypertension.  MRI left shoulder 12/28/18 IMPRESSION: 1. Tendinosis with high-grade partial thickness tears of the supraspinatus and subscapularis tendons. 2. Intra-articular biceps tendinosis. 3. Subacromial-subdeltoid bursitis. 4. Mild-moderate acromioclavicular osteoarthritis.  CXR 09/24/18 FINDINGS: Heart size and pulmonary vascularity are normal. No infiltrates or effusions. No acute bone abnormality. IMPRESSION: No active cardiopulmonary disease.   EKG: 09/24/2018 Rate 90 bpm Sinus rhythm  Atrial premature complex Incomplete left bundle branch block  Borderline prolonged QT interval   CV: Nuclear stress test 04/13/19  The left ventricular ejection fraction is mildly decreased (45-54%).  Nuclear stress EF: 47%.  There was no ST segment deviation noted during stress.  This is an intermediate risk study due to reduced systolic function. There is no ischemia.  New wall motion abnormalities noted. Consider echo if clinically warranted. - Reviewed by Dr. Harrell Gave on 04/14/19 who wrote, "I personally reviewed this study. There is no evidence of ischemia. There is some question of low normal EF and wall motion, but on my review of the study, I cannot exclude this being artifact. Without evidence of ischemia, she is acceptable risk for surgery."   Long Term Monitor 12/02/17-12/05/17 3 days of rhythm interpreted on Zio patch. Patient  had a min HR of Mary bpm, max HR of 193 bpm, and avg HR of 81 bpm. Predominant underlying rhythm was Sinus Rhythm. 217 Supraventricular Tachycardia runs occurred, the run with the fastest interval lasting 6 beats with a max rate of 193 bpm, the longest lasting 1 min 13 secs with an avg rate of 151 bpm. True duration of Supraventricular Tachycardia difficult to ascertain due to Artifact. Thirteen triggered patient events recorded. Supraventricular Tachycardia was detected within +/- 45 seconds of symptomatic patient event(s). Isolated SVEs were occasional (2.0%, 6813), SVE Couplets were rare (<1.0%, 235), and SVE Triplets were rare (<1.0%, 66). No significant ventricular ectopy noted. - Diltiazem recommended   Echo 10/01/2017 Study Conclusions - Left ventricle: The cavity size was normal. Wall thickness was normal. Systolic function was normal. The estimated ejection fraction was in the range of 55% to 60%. Wall motion was normal; there were no regional wall motion abnormalities. Doppler parameters are consistent with abnormal left ventricular relaxation (grade 1 diastolic dysfunction).    Past Medical History:  Diagnosis Date  . Alcoholism ([place])   . Anxiety   . Arthritis 04-10-11   hips, shoulders, back  . Asthma   . Bipolar disorder ([place])   . Cardiomegaly   . Cervical cancer ([place]) 1993   cervical, no treatment done, went away  per pt  . Cervical dysplasia or atypia 04-10-11   '93- once dx.-got pregnant-no intervention, then postpartum, no dysplasia found  . CHF (congestive heart failure) ([place])    no cardiologist 2014 dx, none now  . Chronic hypoxemic respiratory failure ([place])   . Chronic kidney disease    stage 3 kidney disease  . 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 Pathology showed Condyloma, gluteal CONDYLOMA ACUMINATUM.   [name] Kitchen COPD (chronic obstructive pulmonary disease) ([place])   . Depression   . Diabetes mellitus without complication ([place])    . Dyspnea    with actity, sitting  . Fall 12/16/2016  . Fibrocystic breast disease   . GERD (gastroesophageal reflux disease)   . Grade I diastolic dysfunction 25/95/6387   Noted on ECHO  . Hepatitis    hep B-count is low at present,doesn't register  . History of PSVT (paroxysmal supraventricular tachycardia)   . Hypercalcemia   . Hyperlipidemia   . Hypertension   . Incomplete left bundle branch block (LBBB) 09/24/2018   Noted on EKG  . Lung nodule 11/2017   right and left lung  . Migraine headache    none recent  . Morbid obesity ([place]) 03/27/2006  . Neuropathy   . Pneumonia    walking pneu 15 yrs. ago  . Skin lesion 03/15/2011   In gluteal crease now s/p removal by Dr. [name] of General Surgery on 3/12. Path shows condyloma.     . Sleep apnea 04-10-11   uses cpap, pt does not know settings  . Trigger finger    left third  . Urge incontinence of urine     Past Surgical History:  Procedure Laterality Date  . ANAL FISTULECTOMY  04/17/2011   Procedure: FISTULECTOMY ANAL;  Surgeon: Imogene Burn. [name], MD;  Location: [facility];  Service: General;  Laterality: N/A;  Excision of Condyloma Gluteal Cleft   . ANAL RECTAL MANOMETRY N/A 03/26/2019   Procedure: ANO RECTAL MANOMETRY;  Surgeon: Thornton Park, MD;  Location: [facility];  Service: Gastroenterology;  Laterality: N/A;  . BIOPSY  03/09/2019   Procedure: BIOPSY;  Surgeon: Thornton Park, MD;  Location: [facility];  Service: Gastroenterology;;  . Lum Keas INJECTION N/A 12/29/2018   Procedure: BOTOX INJECTION(100 UNITS)  WITH CYSTOSCOPY;  Surgeon: Ceasar Mons, MD;  Location: [facility];  Service: Urology;  Laterality: N/A;  . BREAST CYST EXCISION     bilateral breast, 3 cysts removed from each breast  . BREAST EXCISIONAL BIOPSY Right    x 3  . BREAST EXCISIONAL BIOPSY Left    x 3  . CARPAL TUNNEL RELEASE Bilateral   . Cervical biospy    . CERVICAL FUSION  04-10-11   x3- cervical fusion with plating and screws-Dr.  [name]  . COLONOSCOPY WITH PROPOFOL N/A 07/06/2015   Procedure: COLONOSCOPY WITH PROPOFOL;  Surgeon: [name], MD;  Location: [facility];  Service: Endoscopy;  Laterality: N/A;  . COLONOSCOPY WITH PROPOFOL N/A 03/09/2019   Procedure: COLONOSCOPY WITH PROPOFOL;  Surgeon: Thornton Park, MD;  Location: [facility];  Service: Gastroenterology;  Laterality: N/A;  . DOPPLER ECHOCARDIOGRAPHY  04/06/2012   AT [place] 55-60%  . FOOT SURGERY Left 2018  . FRACTURE SURGERY     little toe left foot  . I & D EXTREMITY Left 12/18/2016   Procedure: IRRIGATION AND DEBRIDEMENT LEFT FOOT, CLOSURE;  Surgeon: [name], MD;  Location: [facility];  Service: Orthopedics;  Laterality: Left;  . LUMBAR DISC  SURGERY  04-10-11   x5-Lumbar fusion-retained hardware.(Dr. Louanne Skye)  . MULTIPLE TOOTH EXTRACTIONS    . POLYPECTOMY  03/09/2019   Procedure: POLYPECTOMY;  Surgeon: Thornton Park, MD;  Location: [facility];  Service: Gastroenterology;;  . TEE WITHOUT CARDIOVERSION N/A 04/06/2012   Procedure: TRANSESOPHAGEAL ECHOCARDIOGRAM (TEE);  Surgeon: [name], MD;  Location: [facility] ENDOSCOPY;  Service: Cardiovascular;  Laterality: N/A;  . TRIGGER FINGER RELEASE Right    middle finger  . TRIGGER FINGER RELEASE Left 06/28/2016   Procedure: RELEASE TRIGGER FINGER LEFT 3RD FINGER;  Surgeon: [name], MD;  Location: [place];  Service: Orthopedics;  Laterality: Left;  . TRIGGER FINGER RELEASE Right 06/12/2016   Procedure: RIGHT INDEX FINGER TRIGGER RELEASE;  Surgeon: [name], MD;  Location: [place];  Service: Orthopedics;  Laterality: Right;  . TRIGGER FINGER RELEASE Right 01/19/2018   Procedure: RIGHT RING FINGER TRIGGER FINGER RELEASE;  Surgeon: [name], MD;  Location: [place];  Service: Orthopedics;  Laterality: Right;    MEDICATIONS: . Accu-Chek Softclix Lancets lancets  . acetaminophen (TYLENOL) 500 MG tablet  . albuterol (VENTOLIN HFA) 108 (90 Base) MCG/ACT inhaler  . ammonium lactate  (LAC-HYDRIN) 12 % lotion  . atorvastatin (LIPITOR) 40 MG tablet  . b complex vitamins capsule  . baclofen (LIORESAL) 10 MG tablet  . blood glucose meter kit and supplies KIT  . busPIRone (BUSPAR) 10 MG tablet  . diclofenac Sodium (VOLTAREN) 1 % GEL  . diltiazem (CARDIZEM CD) 120 MG 24 hr capsule  . docusate sodium (COLACE) 100 MG capsule  . empagliflozin (JARDIANCE) 25 MG TABS tablet  . febuxostat (ULORIC) 40 MG tablet  . fluticasone (FLONASE) 50 MCG/ACT nasal spray  . Fluticasone-Umeclidin-Vilant (TRELEGY ELLIPTA) 100-62.5-25 MCG/INH AEPB  . folic acid (FOLVITE) 859 MCG tablet  . furosemide (LASIX) 40 MG tablet  . gabapentin (NEURONTIN) 300 MG capsule  . glucose blood (ACCU-CHEK AVIVA PLUS) test strip  . HUMALOG KWIKPEN 100 UNIT/ML KwikPen  . HYDROcodone-acetaminophen (NORCO) 7.5-325 MG tablet  . hydrOXYzine (ATARAX/VISTARIL) 10 MG tablet  . Insulin Glargine (LANTUS SOLOSTAR) 100 UNIT/ML Solostar Pen  . Insulin Pen Needle (B-D UF III MINI PEN NEEDLES) 31G X 5 MM MISC  . ketoconazole (NIZORAL) 2 % cream  . loratadine (CLARITIN) 10 MG tablet  . losartan (COZAAR) 100 MG tablet  . metolazone (ZAROXOLYN) 2.5 MG tablet  . montelukast (SINGULAIR) 10 MG tablet  . Multiple Vitamins-Minerals (MULTIVITAMIN WITH MINERALS) tablet  . mupirocin ointment (BACTROBAN) 2 %  . NARCAN 4 MG/0.1ML LIQD nasal spray kit  . nicotine polacrilex (NICORETTE) 4 MG gum  . NON FORMULARY  . nystatin (MYCOSTATIN) 100000 UNIT/ML suspension  . Omega-3 Fatty Acids (FISH OIL) 1000 MG CAPS  . omeprazole (PRILOSEC) 20 MG capsule  . OXYGEN  . potassium chloride SA (KLOR-CON M20) 20 MEQ tablet  . promethazine (PHENERGAN) 25 MG tablet  . sertraline (ZOLOFT) 50 MG tablet  . varenicline (CHANTIX) 0.5 MG tablet   No current facility-administered medications for this encounter.   . technetium tetrofosmin (TC-MYOVIEW) injection 29.2 millicurie    Myra Gianotti, PA-C Surgical Short Stay/Anesthesiology [facility] Phone  [phone] [facility] Phone [phone] 04/28/2019 3:12 PM

## 2019-04-29 DIAGNOSIS — M5090 Cervical disc disorder, unspecified, unspecified cervical region: Secondary | ICD-10-CM | POA: Diagnosis not present

## 2019-04-29 DIAGNOSIS — M5030 Other cervical disc degeneration, unspecified cervical region: Secondary | ICD-10-CM | POA: Diagnosis not present

## 2019-04-29 DIAGNOSIS — G894 Chronic pain syndrome: Secondary | ICD-10-CM | POA: Diagnosis not present

## 2019-04-29 DIAGNOSIS — G4733 Obstructive sleep apnea (adult) (pediatric): Secondary | ICD-10-CM | POA: Diagnosis not present

## 2019-04-29 DIAGNOSIS — Z79899 Other long term (current) drug therapy: Secondary | ICD-10-CM | POA: Diagnosis not present

## 2019-04-29 DIAGNOSIS — M25512 Pain in left shoulder: Secondary | ICD-10-CM | POA: Diagnosis not present

## 2019-04-29 DIAGNOSIS — J9621 Acute and chronic respiratory failure with hypoxia: Secondary | ICD-10-CM | POA: Diagnosis not present

## 2019-05-02 ENCOUNTER — Other Ambulatory Visit: Payer: Self-pay | Admitting: Family Medicine

## 2019-05-02 DIAGNOSIS — F320 Major depressive disorder, single episode, mild: Secondary | ICD-10-CM

## 2019-05-02 DIAGNOSIS — B353 Tinea pedis: Secondary | ICD-10-CM

## 2019-05-02 DIAGNOSIS — E1151 Type 2 diabetes mellitus with diabetic peripheral angiopathy without gangrene: Secondary | ICD-10-CM

## 2019-05-04 ENCOUNTER — Other Ambulatory Visit (HOSPITAL_COMMUNITY)
Admission: RE | Admit: 2019-05-04 | Discharge: 2019-05-04 | Disposition: A | Discharge status: Home / Self Care | Payer: Medicare Other | Source: Ambulatory Visit | Attending: Orthopaedic Surgery | Admitting: Orthopaedic Surgery

## 2019-05-04 DIAGNOSIS — Z20822 Contact with and (suspected) exposure to covid-19: Secondary | ICD-10-CM | POA: Insufficient documentation

## 2019-05-04 DIAGNOSIS — Z01812 Encounter for preprocedural laboratory examination: Secondary | ICD-10-CM | POA: Insufficient documentation

## 2019-05-04 LAB — SARS CORONAVIRUS 2 (TAT 6-24 HRS): SARS Coronavirus 2: NEGATIVE

## 2019-05-05 ENCOUNTER — Ambulatory Visit (INDEPENDENT_AMBULATORY_CARE_PROVIDER_SITE_OTHER): Payer: Medicare Other | Admitting: Adult Health

## 2019-05-05 ENCOUNTER — Other Ambulatory Visit: Payer: Self-pay

## 2019-05-05 VITALS — BP 127/69 | HR 78 | Temp 97.0°F | Ht 70.0 in | Wt 369.0 lb

## 2019-05-05 DIAGNOSIS — G4733 Obstructive sleep apnea (adult) (pediatric): Secondary | ICD-10-CM | POA: Diagnosis not present

## 2019-05-05 DIAGNOSIS — H5213 Myopia, bilateral: Secondary | ICD-10-CM | POA: Diagnosis not present

## 2019-05-05 NOTE — Progress Notes (Signed)
PATIENT: [name] DOB: [birth year]  REASON FOR VISIT: follow up HISTORY FROM: patient  HISTORY OF PRESENT ILLNESS: Today 05/05/19:  [name] is a 54 year old female with a history of obstructive sleep apnea on BiPAP.  Her download indicates that she use her machine 28 out of 30 days for compliance of 93%.  She used her machine greater than 4 hours 26 days for compliance of 87%.  On average she uses her machine 6 hours and 23 minutes.  Her residual AHI is 1 on 20/16 centimeters of water.  She does not have a significant leak.  Reports that she does feel that the pressure is too strong for her.  She would like it reduced. she returns today for an evaluation.  HISTORY 11/30/2018:  She reports that she has not been using her BiPAP lately because she needs new supplies.  She found a mask that she likes. She reports right shoulder pain and decreased range of motion.  She also reports a longstanding history of neck pain.  Sometimes it radiates upwards, sometimes into the shoulder.   She had a cervical spine MRI without contrast on 09/21/2018 and I reviewed the results: IMPRESSION: Unchanged postoperative appearance of the cervical spine with posterior fusion from C4-T2 and ACDF at C7-T1. No spinal canal stenosis or neural impingement.  Of note, more recently, on 11/23/2018 she had an MRI of the right shoulder and I reviewed the results: IMPRESSION: 1. Small full-thickness insertional tear of the supraspinatus tendon anteriorly. No significant tendon retraction or muscular atrophy. 2. Partial insertional tearing of the infraspinatus tendon. 3. Mild bicipital tendinosis. 4. No evidence of labral or biceps tendon tear.  She reports that she has an appointment with her primary care physician tomorrow, she has a follow-up with Dr. Louanne Skye this week as well.  She has looked into bariatric surgery and has started the process for this. She is currently not using her BiPAP essentially since  August 2020 because she needed new supplies.  She tried a different mask but her insurance will not cover it until 6 months from her last mask renewal which was in or around July 2020.  She currently only uses her oxygen at night.  She complains of severe mouth dryness and has tried Biotene mouthwash and toothpaste.  She tries to hydrate with water.  She has had epidural steroid injections and had severe increase in her blood sugar levels, last epidural steroid injection per her report was in 2018.  She had a trigger finger injection in April 2020 which also increased her blood sugar level.  She reports pain in the posterior neck and radiating upwards.  This is more on the left side.  She reports significant right shoulder pain and decrease in range of motion.   The patient's allergies, current medications, family history, past medical history, past social history, past surgical history and problem list were reviewed and updated as appropriate.  REVIEW OF SYSTEMS: Out of a complete 14 system review of symptoms, the patient complains only of the following symptoms, and all other reviewed systems are negative.  Epworth sleepiness score 6  ALLERGIES: Allergies  Allergen Reactions  . Aspirin Other (See Comments)    Kidney disease  . Chantix [Varenicline Tartrate] Other (See Comments)    On the 1 mg dose, patient experienced hallucintation. Patient able to tolerate 0.28m BID   . 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 Corticosteroids Other (See Comments)  Hyperglycemia   . Levofloxacin Itching    HOME MEDICATIONS: Outpatient Medications Prior to Visit  Medication Sig Dispense Refill  . Accu-Chek Softclix Lancets lancets TEST 4 TIMES DAILY 200 each 6  . acetaminophen (TYLENOL) 500 MG tablet Take 1,000 mg by mouth 2 (two) times daily.     [name] Kitchen albuterol (VENTOLIN HFA) 108 (90 Base) MCG/ACT inhaler Inhale 2 puffs into the lungs every 6 (six) hours as needed for  wheezing or shortness of breath. 18 g 3  . ammonium lactate (LAC-HYDRIN) 12 % lotion APPLY TWICE A DAY AS NEEDED FOR DRY SKIN 400 mL 3  . atorvastatin (LIPITOR) 40 MG tablet TAKE 1 TABLET BY MOUTH EVERY DAY (Patient taking differently: Take 40 mg by mouth daily. ) 90 tablet 3  . b complex vitamins capsule Take 1 capsule by mouth daily.    . baclofen (LIORESAL) 10 MG tablet Take 1 tablet (10 mg total) by mouth 3 (three) times daily as needed for muscle spasms. (Patient taking differently: Take 10 mg by mouth 3 (three) times daily. ) 90 each 0  . blood glucose meter kit and supplies KIT Dispense based on patient and insurance preference. Use up to four times daily as directed. (FOR ICD-9 250.00, 250.01). 1 each 0  . busPIRone (BUSPAR) 10 MG tablet TAKE 1 TABLET BY MOUTH THREE TIMES A DAY (Patient taking differently: Take 10 mg by mouth 3 (three) times daily. ) 270 tablet 4  . diclofenac Sodium (VOLTAREN) 1 % GEL Apply 2 g topically 4 (four) times daily. 500 g 1  . diltiazem (CARDIZEM CD) 120 MG 24 hr capsule Take 1 capsule (120 mg total) by mouth 2 (two) times daily. 180 capsule 3  . docusate sodium (COLACE) 100 MG capsule Take 200 mg by mouth 2 (two) times daily.    . empagliflozin (JARDIANCE) 25 MG TABS tablet Take 25 mg by mouth daily.    . febuxostat (ULORIC) 40 MG tablet Take 40 mg by mouth at bedtime.     . fluticasone (FLONASE) 50 MCG/ACT nasal spray Place 2 sprays into both nostrils 2 (two) times daily. 16 g 6  . Fluticasone-Umeclidin-Vilant (TRELEGY ELLIPTA) 100-62.5-25 MCG/INH AEPB Inhale 1 puff into the lungs daily. 761 each 3  . folic acid (FOLVITE) 950 MCG tablet Take 400 mcg by mouth daily.    . furosemide (LASIX) 40 MG tablet TAKE 2 TABLETS (80 MG TOTAL) BY MOUTH 2 (TWO) TIMES DAILY. 360 tablet 1  . gabapentin (NEURONTIN) 300 MG capsule TAKE 2 CAPSULES (600 MG TOTAL) BY MOUTH 3 (THREE) TIMES DAILY. 540 capsule 2  . glucose blood (ACCU-CHEK AVIVA PLUS) test strip TEST 4 TIMES DAILY  (Patient taking differently: 1 each by Other route 3 (three) times daily. TEST 3 TIMES DAILY) 100 each 12  . HUMALOG KWIKPEN 100 UNIT/ML KwikPen INJECT 3 UNITS INTO THE SKIN 3 TIMES DAILY BEFORE MEALS (Patient taking differently: Inject 8 Units into the skin 3 (three) times daily before meals. ) 15 mL 2  . HYDROcodone-acetaminophen (NORCO) 7.5-325 MG tablet Take 1 tablet by mouth every 6 (six) hours as needed for moderate pain. (Patient taking differently: Take 1 tablet by mouth in the morning, at noon, in the evening, and at bedtime. ) 20 tablet 0  . hydrOXYzine (ATARAX/VISTARIL) 10 MG tablet TAKE 1 TABLET BY MOUTH EVERY DAY (Patient taking differently: Take 10 mg by mouth daily. ) 90 tablet 1  . Insulin Glargine (LANTUS SOLOSTAR) 100 UNIT/ML Solostar Pen Inject 22 Units into the  skin daily before breakfast. (Patient taking differently: Inject 20 Units into the skin daily before breakfast. ) 15 mL   . Insulin Pen Needle (B-D UF III MINI PEN NEEDLES) 31G X 5 MM MISC CHECK SUGARS 4 TIMES A DAY BEFORE MEALS AND AT BEDTIME. 100 each 12  . ketoconazole (NIZORAL) 2 % cream APPLY 1 FINGERTIP AMOUNT TO EACH FOOT DAILY. (Patient taking differently: Apply 1 application topically 2 (two) times daily. ) 30 g 0  . loratadine (CLARITIN) 10 MG tablet TAKE 1 TABLET BY MOUTH EVERY DAY (Patient taking differently: Take 10 mg by mouth daily. ) 90 tablet 0  . losartan (COZAAR) 100 MG tablet Take 1 tablet (100 mg total) by mouth daily. 90 tablet 3  . metolazone (ZAROXOLYN) 2.5 MG tablet Take 1 tablet (2.5 mg total) by mouth 2 (two) times a week. TAKE 1 TABLET TWICE WEEKLY ON MONDAYS AND THURSDAYS ONLY (Patient taking differently: Take 2.5 mg by mouth See admin instructions. TAKE 2.5 BY MOUTH  TWICE WEEKLY ON MONDAYS AND THURSDAYS ONLY) 15 tablet 6  . montelukast (SINGULAIR) 10 MG tablet TAKE 1 TABLET BY MOUTH EVERYDAY AT BEDTIME (Patient taking differently: Take 10 mg by mouth at bedtime. ) 90 tablet 3  . Multiple  Vitamins-Minerals (MULTIVITAMIN WITH MINERALS) tablet Take 1 tablet by mouth daily.    . mupirocin ointment (BACTROBAN) 2 % APPLY TO AFFECTED AREA TWICE A DAY (Patient taking differently: Apply 1 application topically 2 (two) times daily as needed (skin fold (stomach)). ) 22 g 0  . NARCAN 4 MG/0.1ML LIQD nasal spray kit Place 1 spray into the nose as needed (accidental overdose).     . nicotine polacrilex (NICORETTE) 4 MG gum CHEW 1 EACH (4 MG TOTAL) BY MOUTH AS NEEDED FOR SMOKING CESSATION. (Patient taking differently: Take 4 mg by mouth as needed for smoking cessation. ) 100 each 2  . NON FORMULARY Uses a C-PAP at bedtime    . nystatin (MYCOSTATIN) 100000 UNIT/ML suspension Take 5 mLs (500,000 Units total) by mouth 4 (four) times daily. Swish and swallow. (Patient taking differently: Take 5 mLs by mouth 4 (four) times daily as needed (dry mouth). Swish and swallow.) 60 mL 3  . Omega-3 Fatty Acids (FISH OIL) 1000 MG CAPS Take 1,000 mg by mouth daily.     [name] Kitchen omeprazole (PRILOSEC) 20 MG capsule Take 1 capsule (20 mg total) by mouth daily. 90 capsule 3  . OXYGEN Inhale 2 L into the lungs continuous.     . potassium chloride SA (KLOR-CON M20) 20 MEQ tablet Take 1 tablet (20 mEq total) by mouth 2 (two) times a week. Take 1 tablet twice weekly on Monday and Thursday. (Patient taking differently: Take 20 mEq by mouth See admin instructions. Take 20 mEq by mouth twice weekly on Monday and Thursday.) 15 tablet 6  . promethazine (PHENERGAN) 25 MG tablet Take 1 tablet (25 mg total) by mouth every 6 (six) hours as needed for nausea. 30 tablet 1  . sertraline (ZOLOFT) 50 MG tablet TAKE 1 TABLET BY MOUTH EVERY DAY 90 tablet 1  . varenicline (CHANTIX) 0.5 MG tablet Take 1 tablet (0.5 mg total) by mouth 2 (two) times daily. 60 tablet 5   Facility-Administered Medications Prior to Visit  Medication Dose Route Frequency Provider Last Rate Last Admin  . technetium tetrofosmin (TC-MYOVIEW) injection 45.8 millicurie   09.9 millicurie Intravenous Once PRN Sueanne Margarita, MD        PAST MEDICAL HISTORY: Past Medical History:  Diagnosis Date  . Alcoholism ([place])   . Anxiety   . Arthritis 04-10-11   hips, shoulders, back  . Asthma   . Bipolar disorder ([place])   . Cardiomegaly   . Cervical cancer ([place]) 1993   cervical, no treatment done, went away per pt  . Cervical dysplasia or atypia 04-10-11   '93- once dx.-got pregnant-no intervention, then postpartum, no dysplasia found  . CHF (congestive heart failure) ([place])    no cardiologist 2014 dx, none now  . Chronic hypoxemic respiratory failure ([place])   . Chronic kidney disease    stage 3 kidney disease  . 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 Pathology showed Condyloma, gluteal CONDYLOMA ACUMINATUM.   [name] Kitchen COPD (chronic obstructive pulmonary disease) ([place])   . Depression   . Diabetes mellitus without complication ([place])   . Dyspnea    with actity, sitting  . Fall 12/16/2016  . Fibrocystic breast disease   . GERD (gastroesophageal reflux disease)   . Grade I diastolic dysfunction 22/33/6122   Noted on ECHO  . Hepatitis    hep B-count is low at present,doesn't register  . History of PSVT (paroxysmal supraventricular tachycardia)   . Hypercalcemia   . Hyperlipidemia   . Hypertension   . Incomplete left bundle branch block (LBBB) 09/24/2018   Noted on EKG  . Lung nodule 11/2017   right and left lung  . Migraine headache    none recent  . Morbid obesity ([place]) 03/27/2006  . Neuropathy   . Pneumonia    walking pneu 15 yrs. ago  . Skin lesion 03/15/2011   In gluteal crease now s/p removal by Dr. [name] of General Surgery on 3/12. Path shows condyloma.     . Sleep apnea 04-10-11   uses cpap, pt does not know settings  . Trigger finger    left third  . Urge incontinence of urine     PAST SURGICAL HISTORY: Past Surgical History:  Procedure Laterality Date  . ANAL FISTULECTOMY  04/17/2011   Procedure: FISTULECTOMY ANAL;   Surgeon: Imogene Burn. [name], MD;  Location: [facility];  Service: General;  Laterality: N/A;  Excision of Condyloma Gluteal Cleft   . ANAL RECTAL MANOMETRY N/A 03/26/2019   Procedure: ANO RECTAL MANOMETRY;  Surgeon: Thornton Park, MD;  Location: [facility];  Service: Gastroenterology;  Laterality: N/A;  . BIOPSY  03/09/2019   Procedure: BIOPSY;  Surgeon: Thornton Park, MD;  Location: [facility];  Service: Gastroenterology;;  . Lum Keas INJECTION N/A 12/29/2018   Procedure: BOTOX INJECTION(100 UNITS)  WITH CYSTOSCOPY;  Surgeon: Ceasar Mons, MD;  Location: [facility];  Service: Urology;  Laterality: N/A;  . BREAST CYST EXCISION     bilateral breast, 3 cysts removed from each breast  . BREAST EXCISIONAL BIOPSY Right    x 3  . BREAST EXCISIONAL BIOPSY Left    x 3  . CARPAL TUNNEL RELEASE Bilateral   . Cervical biospy    . CERVICAL FUSION  04-10-11   x3- cervical fusion with plating and screws-Dr. [name]  . COLONOSCOPY WITH PROPOFOL N/A 07/06/2015   Procedure: COLONOSCOPY WITH PROPOFOL;  Surgeon: [name], MD;  [facility];  Service: Endoscopy;  Laterality: N/A;  . COLONOSCOPY WITH PROPOFOL N/A 03/09/2019   Procedure: COLONOSCOPY WITH PROPOFOL;  Surgeon: Thornton Park, MD;  [facility];  Service: Gastroenterology;  Laterality: N/A;  . DOPPLER ECHOCARDIOGRAPHY  04/06/2012   AT [place] 55-60%  . FOOT SURGERY Left  2018  . FRACTURE SURGERY     little toe left foot  . I & D EXTREMITY Left 12/18/2016   Procedure: IRRIGATION AND DEBRIDEMENT LEFT FOOT, CLOSURE;  Surgeon: [name], MD;  Location: [facility];  Service: Orthopedics;  Laterality: Left;  . [place] SURGERY  04-10-11   x5-Lumbar fusion-retained hardware.(Dr. Louanne Skye)  . MULTIPLE TOOTH EXTRACTIONS    . POLYPECTOMY  03/09/2019   Procedure: POLYPECTOMY;  Surgeon: Thornton Park, MD;  [facility];  Service: Gastroenterology;;  . TEE WITHOUT CARDIOVERSION N/A 04/06/2012   Procedure:  TRANSESOPHAGEAL ECHOCARDIOGRAM (TEE);  Surgeon: [name], MD;  Location[facility];  Service: Cardiovascular;  Laterality: N/A;  . TRIGGER FINGER RELEASE Right    middle finger  . TRIGGER FINGER RELEASE Left 06/28/2016   Procedure: RELEASE TRIGGER FINGER LEFT 3RD FINGER;  Surgeon: [name], MD;  Location: [facility];  Service: Orthopedics;  Laterality: Left;  . TRIGGER FINGER RELEASE Right 06/12/2016   Procedure: RIGHT INDEX FINGER TRIGGER RELEASE;  Surgeon: [name], MD;  Location: [place] [facility];  Service: Orthopedics;  Laterality: Right;  . TRIGGER FINGER RELEASE Right 01/19/2018   Procedure: RIGHT RING FINGER TRIGGER FINGER RELEASE;  Surgeon: [name], MD;  Location: [facility];  Service: Orthopedics;  Laterality: Right;    FAMILY HISTORY: Family History  Problem Relation Age of Onset  . Pulmonary embolism Mother   . Stroke Father   . Alcohol abuse Father   . Pneumonia Father   . Hypertension Father   . Breast cancer Maternal Aunt   . Hypertension Other   . Diabetes Other        Aunts and cousins  . Asthma Sister   . Depression Sister   . Arthritis Sister   . Sickle cell anemia Son     SOCIAL HISTORY: Social History   Socioeconomic History  . Marital status: Single    Spouse name: Not on file  . Number of children: 1  . Years of education: 19  . Highest education level: 12th grade  Occupational History  . Occupation: disabled    Comment: Disabled  Tobacco Use  . Smoking status: Current Every Day Smoker    Packs/day: 0.20    Years: 40.00    Pack years: 8.00    Types: Cigarettes    Start date: 01/28/1977  . Smokeless tobacco: Never Used  . Tobacco comment: 4-5 cigarettes per day  Substance and Sexual Activity  . Alcohol use: No    Alcohol/week: 0.0 standard drinks  . Drug use: Not Currently    Types: Marijuana, "Crack" cocaine    Comment: hx marijuana usemany years ago, Crack -none since 11/2015  . Sexual activity: Not Currently    Partners: Male    Birth  control/protection: Post-menopausal  Other Topics Concern  . Not on file  Social History Narrative   Current Social History       Who lives at home: Patient lives alone in one level home; has 4 steps onto porch with a handrail. Has smoke alarms, no throw rugs. Has elevated toilet. Has shower bench in tub with grab rails.    Transportation: Patient has own vehicle and drives herself    Important Relationships "Family and friends"    Pets: None    Likes to eat varied diet. Seafood, fish, roasted [place] breast, vegetables and salads and fruits.   Education / Work:  12 th grade/ None    Interests / Fun: Education officer, museum on  phone, watch TV, Be with family and friends, sit on my porch."    Current Stressors: None    Religious / Personal Beliefs: "God Jesus"    Other: "I love everyone, I wake up with joy in my heart and go to sleep the same way. I'm a lovable person."                                                                                                   Social Determinants of Health   Financial Resource Strain:   . Difficulty of Paying Living Expenses:   Food Insecurity:   . Worried About Charity fundraiser in the Last Year:   . Arboriculturist in the Last Year:   Transportation Needs:   . Film/video editor (Medical):   [name] Kitchen Lack of Transportation (Non-Medical):   Physical Activity:   . Days of Exercise per Week:   . Minutes of Exercise per Session:   Stress:   . Feeling of Stress :   Social Connections:   . Frequency of Communication with Friends and Family:   . Frequency of Social Gatherings with Friends and Family:   . Attends Religious Services:   . Active Member of Clubs or Organizations:   . Attends Archivist Meetings:   [name] Kitchen Marital Status:   Intimate Partner Violence:   . Fear of Current or Ex-Partner:   . Emotionally Abused:   [name] Kitchen Physically Abused:   . Sexually Abused:       PHYSICAL EXAM  Vitals:   05/05/19 0741  BP: 127/69  Pulse: 78  Temp:  (!) 97 F (36.1 C)  Weight: (!) 369 lb (167.4 kg)  Height: '5\' 10"'  (1.778 m)   Body mass index is 52.95 kg/m.  Generalized: Well developed, in no acute distress  Chest: Lungs clear to auscultation bilaterally  Neurological examination  Mentation: Alert oriented to time, place, history taking. Follows all commands speech and language fluent Cranial nerve II-XII: Extraocular movements were full, visual field were full on confrontational test Head turning and shoulder shrug  were normal and symmetric. Motor: The motor testing reveals 5 over 5 strength of all 4 extremities. Good symmetric motor tone is noted throughout.  Sensory: Sensory testing is intact to soft touch on all 4 extremities. No evidence of extinction is noted.  Gait and station: Gait is normal.    DIAGNOSTIC DATA (LABS, IMAGING, TESTING) - I reviewed patient records, labs, notes, testing and imaging myself where available.  Lab Results  Component Value Date   WBC 10.5 04/27/2019   HGB 15.8 (H) 04/27/2019   HCT 48.1 (H) 04/27/2019   MCV 82.9 04/27/2019   PLT 325 04/27/2019      Component Value Date/Time   NA 141 04/27/2019 0828   NA 143 12/17/2018 0948   K 3.5 04/27/2019 0828   CL 100 04/27/2019 0828   CO2 28 04/27/2019 0828   GLUCOSE 163 (H) 04/27/2019 0828   BUN 23 (H) 04/27/2019 0828   BUN 23 12/17/2018 0948   CREATININE 1.32 (H) 04/27/2019 0211  CREATININE 1.54 (H) 11/30/2015 1112   CALCIUM 9.7 04/27/2019 0828   PROT 7.0 04/27/2019 0828   PROT 7.1 02/23/2018 1446   ALBUMIN 3.8 04/27/2019 0828   ALBUMIN 4.3 02/23/2018 1446   AST 27 04/27/2019 0828   ALT 32 04/27/2019 0828   ALKPHOS 79 04/27/2019 0828   BILITOT 0.9 04/27/2019 0828   BILITOT 0.2 02/23/2018 1446   GFRNONAA 45 (L) 04/27/2019 0828   GFRNONAA 39 (L) 11/30/2015 1112   GFRAA 53 (L) 04/27/2019 0828   GFRAA 44 (L) 11/30/2015 1112   Lab Results  Component Value Date   CHOL 146 09/24/2018   HDL 37 (L) 09/24/2018   LDLCALC 76 09/24/2018     LDLDIRECT 114 (H) 09/15/2012   TRIG 165 (H) 09/24/2018   CHOLHDL 3.9 09/24/2018   Lab Results  Component Value Date   HGBA1C 7.4 (A) 03/22/2019   No results found for: VITAMINB12 Lab Results  Component Value Date   TSH 0.469 03/06/2018      ASSESSMENT AND PLAN 54 y.o. year old female  has a past medical history of Alcoholism ([place]), Anxiety, Arthritis (04-10-11), Asthma, Bipolar disorder ([place]), Cardiomegaly, Cervical cancer ([place]) (1993), Cervical dysplasia or atypia (04-10-11), CHF (congestive heart failure) ([place]), Chronic hypoxemic respiratory failure ([place]), Chronic kidney disease, Condyloma - gluteal cleft (04/09/2011), COPD (chronic obstructive pulmonary disease) ([place]), Depression, Diabetes mellitus without complication ([place]), Dyspnea, Fall (12/16/2016), Fibrocystic breast disease, GERD (gastroesophageal reflux disease), Grade I diastolic dysfunction (81/27/5170), Hepatitis, History of PSVT (paroxysmal supraventricular tachycardia), Hypercalcemia, Hyperlipidemia, Hypertension, Incomplete left bundle branch block (LBBB) (09/24/2018), Lung nodule (11/2017), Migraine headache, Morbid obesity ([place]) (03/27/2006), Neuropathy, Pneumonia, Skin lesion (03/15/2011), Sleep apnea (04-10-11), Trigger finger, and Urge incontinence of urine. here with:  1.  Obstructive sleep apnea on BiPAP  -Excellent compliance -Good treatment of apnea -I will reduce pressure to 18/14 centimeters of water-she is to call if this is not comfortable for her.  Also advised that she can call in 1 month and we will pull a download to make sure this is still treating her apnea. -Follow-up in 1 year or sooner if needed   I spent 20 minutes of face-to-face and non-face-to-face time with patient.  This included previsit chart review, lab review, study review, order entry, electronic health record documentation, patient education.  Ward Givens, MSN, NP-C 05/05/2019, 8:11 AM [facility] [address], [place], [place] [address] [phone]

## 2019-05-05 NOTE — Patient Instructions (Signed)
Continue using BiPAP nightly and greater than 4 hours each night Change pressure 18/14 DME should call you Call in 1 month for [place] to look at download If your symptoms worsen or you develop new symptoms please let us know.

## 2019-05-06 MED ORDER — DEXTROSE 5 % IV SOLN
3.0000 g | INTRAVENOUS | Status: AC
  Administered 2019-05-07: 3 g via INTRAVENOUS
  Filled 2019-05-06 (×2): qty 3000

## 2019-05-07 ENCOUNTER — Other Ambulatory Visit: Payer: Self-pay

## 2019-05-07 ENCOUNTER — Ambulatory Visit (HOSPITAL_COMMUNITY)
Admission: RE | Admit: 2019-05-07 | Discharge: 2019-05-07 | Disposition: A | Discharge status: Home / Self Care | Payer: Medicare Other | Attending: Orthopaedic Surgery | Admitting: Orthopaedic Surgery

## 2019-05-07 ENCOUNTER — Encounter (HOSPITAL_COMMUNITY): Payer: Self-pay | Admitting: Orthopaedic Surgery

## 2019-05-07 ENCOUNTER — Encounter (HOSPITAL_COMMUNITY)
Admission: RE | Disposition: A | Discharge status: Home / Self Care | Payer: Self-pay | Source: Home / Self Care | Attending: Orthopaedic Surgery

## 2019-05-07 ENCOUNTER — Ambulatory Visit (HOSPITAL_COMMUNITY): Payer: Medicare Other | Admitting: Vascular Surgery

## 2019-05-07 ENCOUNTER — Ambulatory Visit (HOSPITAL_COMMUNITY): Payer: Medicare Other | Admitting: Anesthesiology

## 2019-05-07 DIAGNOSIS — J45909 Unspecified asthma, uncomplicated: Secondary | ICD-10-CM | POA: Diagnosis not present

## 2019-05-07 DIAGNOSIS — J9611 Chronic respiratory failure with hypoxia: Secondary | ICD-10-CM | POA: Insufficient documentation

## 2019-05-07 DIAGNOSIS — I130 Hypertensive heart and chronic kidney disease with heart failure and stage 1 through stage 4 chronic kidney disease, or unspecified chronic kidney disease: Secondary | ICD-10-CM | POA: Diagnosis not present

## 2019-05-07 DIAGNOSIS — Z9981 Dependence on supplemental oxygen: Secondary | ICD-10-CM | POA: Insufficient documentation

## 2019-05-07 DIAGNOSIS — Z8261 Family history of arthritis: Secondary | ICD-10-CM | POA: Insufficient documentation

## 2019-05-07 DIAGNOSIS — M479 Spondylosis, unspecified: Secondary | ICD-10-CM | POA: Insufficient documentation

## 2019-05-07 DIAGNOSIS — Z8249 Family history of ischemic heart disease and other diseases of the circulatory system: Secondary | ICD-10-CM | POA: Insufficient documentation

## 2019-05-07 DIAGNOSIS — Z6841 Body Mass Index (BMI) 40.0 and over, adult: Secondary | ICD-10-CM | POA: Insufficient documentation

## 2019-05-07 DIAGNOSIS — M75102 Unspecified rotator cuff tear or rupture of left shoulder, not specified as traumatic: Secondary | ICD-10-CM | POA: Diagnosis not present

## 2019-05-07 DIAGNOSIS — Z794 Long term (current) use of insulin: Secondary | ICD-10-CM | POA: Insufficient documentation

## 2019-05-07 DIAGNOSIS — Z832 Family history of diseases of the blood and blood-forming organs and certain disorders involving the immune mechanism: Secondary | ICD-10-CM | POA: Insufficient documentation

## 2019-05-07 DIAGNOSIS — Z981 Arthrodesis status: Secondary | ICD-10-CM | POA: Insufficient documentation

## 2019-05-07 DIAGNOSIS — M65322 Trigger finger, left index finger: Secondary | ICD-10-CM

## 2019-05-07 DIAGNOSIS — G4730 Sleep apnea, unspecified: Secondary | ICD-10-CM | POA: Diagnosis not present

## 2019-05-07 DIAGNOSIS — Z79899 Other long term (current) drug therapy: Secondary | ICD-10-CM | POA: Insufficient documentation

## 2019-05-07 DIAGNOSIS — Z811 Family history of alcohol abuse and dependence: Secondary | ICD-10-CM | POA: Insufficient documentation

## 2019-05-07 DIAGNOSIS — Z8541 Personal history of malignant neoplasm of cervix uteri: Secondary | ICD-10-CM | POA: Diagnosis not present

## 2019-05-07 DIAGNOSIS — X58XXXA Exposure to other specified factors, initial encounter: Secondary | ICD-10-CM | POA: Diagnosis not present

## 2019-05-07 DIAGNOSIS — M19012 Primary osteoarthritis, left shoulder: Secondary | ICD-10-CM | POA: Diagnosis not present

## 2019-05-07 DIAGNOSIS — M19011 Primary osteoarthritis, right shoulder: Secondary | ICD-10-CM | POA: Insufficient documentation

## 2019-05-07 DIAGNOSIS — I509 Heart failure, unspecified: Secondary | ICD-10-CM | POA: Insufficient documentation

## 2019-05-07 DIAGNOSIS — F419 Anxiety disorder, unspecified: Secondary | ICD-10-CM | POA: Diagnosis not present

## 2019-05-07 DIAGNOSIS — F319 Bipolar disorder, unspecified: Secondary | ICD-10-CM | POA: Diagnosis not present

## 2019-05-07 DIAGNOSIS — Z803 Family history of malignant neoplasm of breast: Secondary | ICD-10-CM | POA: Insufficient documentation

## 2019-05-07 DIAGNOSIS — K219 Gastro-esophageal reflux disease without esophagitis: Secondary | ICD-10-CM | POA: Insufficient documentation

## 2019-05-07 DIAGNOSIS — M65812 Other synovitis and tenosynovitis, left shoulder: Secondary | ICD-10-CM | POA: Insufficient documentation

## 2019-05-07 DIAGNOSIS — Z823 Family history of stroke: Secondary | ICD-10-CM | POA: Insufficient documentation

## 2019-05-07 DIAGNOSIS — E1122 Type 2 diabetes mellitus with diabetic chronic kidney disease: Secondary | ICD-10-CM | POA: Diagnosis not present

## 2019-05-07 DIAGNOSIS — Z791 Long term (current) use of non-steroidal anti-inflammatories (NSAID): Secondary | ICD-10-CM | POA: Insufficient documentation

## 2019-05-07 DIAGNOSIS — F17210 Nicotine dependence, cigarettes, uncomplicated: Secondary | ICD-10-CM | POA: Insufficient documentation

## 2019-05-07 DIAGNOSIS — I472 Ventricular tachycardia: Secondary | ICD-10-CM | POA: Insufficient documentation

## 2019-05-07 DIAGNOSIS — S46012A Strain of muscle(s) and tendon(s) of the rotator cuff of left shoulder, initial encounter: Secondary | ICD-10-CM | POA: Diagnosis not present

## 2019-05-07 DIAGNOSIS — J449 Chronic obstructive pulmonary disease, unspecified: Secondary | ICD-10-CM | POA: Diagnosis not present

## 2019-05-07 DIAGNOSIS — Z833 Family history of diabetes mellitus: Secondary | ICD-10-CM | POA: Insufficient documentation

## 2019-05-07 DIAGNOSIS — D751 Secondary polycythemia: Secondary | ICD-10-CM | POA: Insufficient documentation

## 2019-05-07 DIAGNOSIS — I10 Essential (primary) hypertension: Secondary | ICD-10-CM | POA: Diagnosis not present

## 2019-05-07 DIAGNOSIS — Z825 Family history of asthma and other chronic lower respiratory diseases: Secondary | ICD-10-CM | POA: Insufficient documentation

## 2019-05-07 DIAGNOSIS — N183 Chronic kidney disease, stage 3 unspecified: Secondary | ICD-10-CM | POA: Insufficient documentation

## 2019-05-07 DIAGNOSIS — M7542 Impingement syndrome of left shoulder: Secondary | ICD-10-CM

## 2019-05-07 DIAGNOSIS — E785 Hyperlipidemia, unspecified: Secondary | ICD-10-CM | POA: Diagnosis not present

## 2019-05-07 DIAGNOSIS — Z886 Allergy status to analgesic agent status: Secondary | ICD-10-CM | POA: Insufficient documentation

## 2019-05-07 DIAGNOSIS — E1140 Type 2 diabetes mellitus with diabetic neuropathy, unspecified: Secondary | ICD-10-CM | POA: Insufficient documentation

## 2019-05-07 DIAGNOSIS — M160 Bilateral primary osteoarthritis of hip: Secondary | ICD-10-CM | POA: Diagnosis not present

## 2019-05-07 DIAGNOSIS — Z881 Allergy status to other antibiotic agents status: Secondary | ICD-10-CM | POA: Insufficient documentation

## 2019-05-07 HISTORY — PX: SHOULDER ARTHROSCOPY WITH ROTATOR CUFF REPAIR AND SUBACROMIAL DECOMPRESSION: SHX5686

## 2019-05-07 HISTORY — PX: TRIGGER FINGER RELEASE: SHX641

## 2019-05-07 LAB — GLUCOSE, CAPILLARY
Glucose-Capillary: 151 mg/dL — ABNORMAL HIGH (ref 70–99)
Glucose-Capillary: 134 mg/dL — ABNORMAL HIGH (ref 70–99)
Glucose-Capillary: 165 mg/dL — ABNORMAL HIGH (ref 70–99)

## 2019-05-07 SURGERY — SHOULDER ARTHROSCOPY WITH ROTATOR CUFF REPAIR AND SUBACROMIAL DECOMPRESSION
Anesthesia: General | Site: Shoulder | Laterality: Left

## 2019-05-07 MED ORDER — SODIUM CHLORIDE 0.9 % IV SOLN
INTRAVENOUS | Status: DC | PRN
  Administered 2019-05-07: 13:00:00 20 ug/min via INTRAVENOUS

## 2019-05-07 MED ORDER — ONDANSETRON HCL 4 MG PO TABS: 4.0000 mg | ORAL_TABLET | Freq: Three times a day (TID) | ORAL | 0 refills | Status: DC | PRN

## 2019-05-07 MED ORDER — OXYCODONE-ACETAMINOPHEN 5-325 MG PO TABS: 2.0000 | ORAL_TABLET | Freq: Once | ORAL | Status: DC

## 2019-05-07 MED ORDER — MIDAZOLAM HCL 2 MG/2ML IJ SOLN
INTRAMUSCULAR | Status: AC
  Filled 2019-05-07: qty 2

## 2019-05-07 MED ORDER — SODIUM CHLORIDE 0.9 % IR SOLN
Status: DC | PRN
  Administered 2019-05-07: 9000 mL

## 2019-05-07 MED ORDER — SUGAMMADEX SODIUM 500 MG/5ML IV SOLN
INTRAVENOUS | Status: AC
  Filled 2019-05-07: qty 5

## 2019-05-07 MED ORDER — BUPIVACAINE HCL (PF) 0.25 % IJ SOLN
INTRAMUSCULAR | Status: AC
  Filled 2019-05-07: qty 30

## 2019-05-07 MED ORDER — EPINEPHRINE PF 1 MG/ML IJ SOLN
INTRAMUSCULAR | Status: AC
  Filled 2019-05-07: qty 1

## 2019-05-07 MED ORDER — ACETAMINOPHEN 10 MG/ML IV SOLN
INTRAVENOUS | Status: DC | PRN
  Administered 2019-05-07: 1000 mg via INTRAVENOUS

## 2019-05-07 MED ORDER — OXYCODONE-ACETAMINOPHEN 5-325 MG PO TABS
ORAL_TABLET | ORAL | Status: AC
  Administered 2019-05-07: 16:00:00 2
  Filled 2019-05-07: qty 2

## 2019-05-07 MED ORDER — CEFAZOLIN SODIUM-DEXTROSE 2-4 GM/100ML-% IV SOLN
INTRAVENOUS | Status: AC
  Filled 2019-05-07: qty 100

## 2019-05-07 MED ORDER — LIDOCAINE HCL (CARDIAC) PF 100 MG/5ML IV SOSY
PREFILLED_SYRINGE | INTRAVENOUS | Status: DC | PRN
  Administered 2019-05-07: 80 mg via INTRAVENOUS

## 2019-05-07 MED ORDER — OXYCODONE-ACETAMINOPHEN 5-325 MG PO TABS: 1.0000 | ORAL_TABLET | Freq: Three times a day (TID) | ORAL | 0 refills | Status: DC | PRN

## 2019-05-07 MED ORDER — KETAMINE HCL 50 MG/5ML IJ SOSY
PREFILLED_SYRINGE | INTRAMUSCULAR | Status: AC
  Filled 2019-05-07: qty 5

## 2019-05-07 MED ORDER — MIDAZOLAM HCL 5 MG/5ML IJ SOLN
INTRAMUSCULAR | Status: DC | PRN
  Administered 2019-05-07: 2 mg via INTRAVENOUS

## 2019-05-07 MED ORDER — ACETAMINOPHEN 500 MG PO TABS
1000.0000 mg | ORAL_TABLET | Freq: Once | ORAL | Status: DC
  Filled 2019-05-07: qty 2

## 2019-05-07 MED ORDER — FENTANYL CITRATE (PF) 100 MCG/2ML IJ SOLN
INTRAMUSCULAR | Status: DC | PRN
  Administered 2019-05-07: 50 ug via INTRAVENOUS
  Administered 2019-05-07: 150 ug via INTRAVENOUS

## 2019-05-07 MED ORDER — ROCURONIUM BROMIDE 100 MG/10ML IV SOLN
INTRAVENOUS | Status: DC | PRN
  Administered 2019-05-07: 100 mg via INTRAVENOUS

## 2019-05-07 MED ORDER — KETAMINE HCL 10 MG/ML IJ SOLN
INTRAMUSCULAR | Status: DC | PRN
  Administered 2019-05-07: 50 mg via INTRAVENOUS

## 2019-05-07 MED ORDER — OXYCODONE HCL 5 MG PO TABS: 5.0000 mg | ORAL_TABLET | Freq: Once | ORAL | Status: DC | PRN

## 2019-05-07 MED ORDER — ONDANSETRON HCL 4 MG/2ML IJ SOLN
INTRAMUSCULAR | Status: DC | PRN
  Administered 2019-05-07: 4 mg via INTRAVENOUS

## 2019-05-07 MED ORDER — DEXMEDETOMIDINE HCL IN NACL 200 MCG/50ML IV SOLN
INTRAVENOUS | Status: DC | PRN
  Administered 2019-05-07: .3 ug/kg/h via INTRAVENOUS

## 2019-05-07 MED ORDER — BUPIVACAINE-EPINEPHRINE (PF) 0.75% -1:200000 IJ SOLN
INTRAMUSCULAR | Status: DC | PRN
  Administered 2019-05-07: 21 mL via INTRAPLEURAL

## 2019-05-07 MED ORDER — FENTANYL CITRATE (PF) 250 MCG/5ML IJ SOLN
INTRAMUSCULAR | Status: AC
  Filled 2019-05-07: qty 5

## 2019-05-07 MED ORDER — ACETAMINOPHEN 10 MG/ML IV SOLN
INTRAVENOUS | Status: AC
  Filled 2019-05-07: qty 100

## 2019-05-07 MED ORDER — PROPOFOL 10 MG/ML IV BOLUS
INTRAVENOUS | Status: DC | PRN
  Administered 2019-05-07: 200 mg via INTRAVENOUS

## 2019-05-07 MED ORDER — PROMETHAZINE HCL 25 MG/ML IJ SOLN: 6.2500 mg | INTRAMUSCULAR | Status: DC | PRN

## 2019-05-07 MED ORDER — ESMOLOL HCL 100 MG/10ML IV SOLN
INTRAVENOUS | Status: DC | PRN
  Administered 2019-05-07 (×2): 10 mg via INTRAVENOUS

## 2019-05-07 MED ORDER — FENTANYL CITRATE (PF) 100 MCG/2ML IJ SOLN
INTRAMUSCULAR | Status: AC
  Filled 2019-05-07: qty 2

## 2019-05-07 MED ORDER — BUPIVACAINE-EPINEPHRINE (PF) 0.25% -1:200000 IJ SOLN
INTRAMUSCULAR | Status: DC | PRN
  Administered 2019-05-07: 9 mL via PERINEURAL

## 2019-05-07 MED ORDER — HYDROMORPHONE HCL 1 MG/ML IJ SOLN: 0.2500 mg | INTRAMUSCULAR | Status: DC | PRN

## 2019-05-07 MED ORDER — OXYCODONE HCL 5 MG/5ML PO SOLN: 5.0000 mg | Freq: Once | ORAL | Status: DC | PRN

## 2019-05-07 MED ORDER — SUGAMMADEX SODIUM 200 MG/2ML IV SOLN
INTRAVENOUS | Status: DC | PRN
  Administered 2019-05-07: 700 mg via INTRAVENOUS

## 2019-05-07 MED ORDER — LACTATED RINGERS IV SOLN: INTRAVENOUS | Status: DC

## 2019-05-07 SURGICAL SUPPLY — 48 items
ANCH SUT SWLK 24.5 SLF PNCH VT (Anchor) ×2 IMPLANT
ANCHOR BIOCOMP SWIVELOCK (Anchor) ×1 IMPLANT
BLADE EXCALIBUR 4.0X13 (MISCELLANEOUS) ×3 IMPLANT
BLADE SURG 11 STRL SS (BLADE) ×3 IMPLANT
BOOTCOVER CLEANROOM LRG (PROTECTIVE WEAR) ×6 IMPLANT
BUR OVAL 4.0 (BURR) ×3 IMPLANT
BURR OVAL 8 FLU 4.0X13 (MISCELLANEOUS) ×2 IMPLANT
CANNULA 5.75X7 CRYSTAL CLEAR (CANNULA) ×3 IMPLANT
CANNULA TWIST IN 8.25X7CM (CANNULA) ×3 IMPLANT
DRAPE INCISE IOBAN 66X45 STRL (DRAPES) ×3 IMPLANT
DRAPE POUCH INSTRU U-SHP 10X18 (DRAPES) ×1 IMPLANT
DRAPE STERI 35X30 U-POUCH (DRAPES) ×3 IMPLANT
DRAPE U-SHAPE 47X51 STRL (DRAPES) ×3 IMPLANT
DRSG PAD ABDOMINAL 8X10 ST (GAUZE/BANDAGES/DRESSINGS) ×9 IMPLANT
DURAPREP 26ML APPLICATOR (WOUND CARE) ×6 IMPLANT
GAUZE SPONGE 4X4 12PLY STRL (GAUZE/BANDAGES/DRESSINGS) ×4 IMPLANT
GLOVE BIOGEL PI IND STRL 7.0 (GLOVE) ×2 IMPLANT
GLOVE BIOGEL PI INDICATOR 7.0 (GLOVE) ×1
GLOVE ECLIPSE 7.0 STRL STRAW (GLOVE) ×3 IMPLANT
GLOVE SKINSENSE NS SZ7.5 (GLOVE) ×2
GLOVE SKINSENSE STRL SZ7.5 (GLOVE) ×4 IMPLANT
GOWN STRL REIN XL XLG (GOWN DISPOSABLE) ×3 IMPLANT
KIT BASIN OR (CUSTOM PROCEDURE TRAY) ×3 IMPLANT
KIT SHOULDER TRACTION (DRAPES) ×3 IMPLANT
KIT TURNOVER KIT B (KITS) ×3 IMPLANT
MANIFOLD NEPTUNE II (INSTRUMENTS) ×3 IMPLANT
NDL SCORPION MULTI FIRE (NEEDLE) IMPLANT
NDL SPNL 18GX3.5 QUINCKE PK (NEEDLE) ×2 IMPLANT
NEEDLE SCORPION MULTI FIRE (NEEDLE) ×3 IMPLANT
NEEDLE SPNL 18GX3.5 QUINCKE PK (NEEDLE) ×3 IMPLANT
NS IRRIG 1000ML POUR BTL (IV SOLUTION) ×3 IMPLANT
PACK SHOULDER (CUSTOM PROCEDURE TRAY) ×3 IMPLANT
PAD ARMBOARD 7.5X6 YLW CONV (MISCELLANEOUS) ×6 IMPLANT
PADDING CAST ABS 4INX4YD NS (CAST SUPPLIES) ×1
PADDING CAST ABS COTTON 4X4 ST (CAST SUPPLIES) IMPLANT
PORT APPOLLO RF 90DEGREE MULTI (SURGICAL WAND) ×2 IMPLANT
SLING ARM FOAM STRAP LRG (SOFTGOODS) IMPLANT
SLING ARM FOAM STRAP MED (SOFTGOODS) IMPLANT
SLING ARM IMMOBILIZER XL (CAST SUPPLIES) ×2 IMPLANT
SPONGE LAP 4X18 RFD (DISPOSABLE) ×3 IMPLANT
SUT ETHILON 3 0 PS 1 (SUTURE) ×3 IMPLANT
SUT FIBERWIRE #2 38 T-5 BLUE (SUTURE) ×6
SUTURE FIBERWR #2 38 T-5 BLUE (SUTURE) IMPLANT
TAPE PAPER 3X10 WHT MICROPORE (GAUZE/BANDAGES/DRESSINGS) ×3 IMPLANT
TOWEL GREEN STERILE (TOWEL DISPOSABLE) ×3 IMPLANT
TOWEL GREEN STERILE FF (TOWEL DISPOSABLE) ×3 IMPLANT
TUBING ARTHROSCOPY IRRIG 16FT (MISCELLANEOUS) ×3 IMPLANT
WATER STERILE IRR 1000ML POUR (IV SOLUTION) ×3 IMPLANT

## 2019-05-07 NOTE — Anesthesia Postprocedure Evaluation (Signed)
Anesthesia Post Note  Patient: [name]  Procedure(s) Performed: LEFT SHOULDER ARTHROSCOPY WITH DEBRIDEMENT, DISTAL CLAVICLE EXCISION,  SUBACROMIAL DECOMPRESSION AND POSSIBLE ROTATOR CUFF REPAIR (Left Shoulder) RELEASE TRIGGER FINGER LEFT INDEX FINGER (Left )     Patient location during evaluation: PACU Anesthesia Type: General Level of consciousness: awake and alert, oriented and patient cooperative Pain management: pain level controlled Vital Signs Assessment: post-procedure vital signs reviewed and stable Respiratory status: spontaneous breathing, nonlabored ventilation and respiratory function stable Cardiovascular status: blood pressure returned to baseline and stable Postop Assessment: no apparent nausea or vomiting Anesthetic complications: no    Last Vitals:  Vitals:   05/07/19 1508 05/07/19 1530  BP: 102/60   Pulse:  66  Resp:  (!) 22  Temp:    SpO2:  99%    Last Pain:  Vitals:   05/07/19 1530  TempSrc:   PainSc: [place]

## 2019-05-07 NOTE — Op Note (Signed)
Date of Surgery: 05/07/2019  INDICATIONS: The patient is a 54 year old female with chronic left shoulder pain that has failed conservative treatment;  The patient did consent to the procedure after discussion of the risks and benefits.  PREOPERATIVE DIAGNOSIS:  1. Left full thickness supraspinatus tear, non-retracted 2. Left shoulder impingement syndrome 3.  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  PROCEDURE:  1.  Arthroscopic left rotator cuff repair of supraspinatus 2.  Arthroscopic left shoulder extensive debridement of labrum, biceps, rotator cuff, biceps tenotomy 3.  Arthroscopic left subacromial decompression with acromioplasty and CA ligament release 4.  Trigger finger release of left index finger  SURGEON: N. Eduard Roux, M.D.  ASSIST: Ciro Backer [place], [place]; necessary for the timely completion of procedure and due to complexity of procedure..  ANESTHESIA:  general, regional  IV FLUIDS AND URINE: See anesthesia.  ESTIMATED BLOOD LOSS: minimal mL.  IMPLANTS: Arthrex 4.2mm self punching swivel lock  COMPLICATIONS: None.  DESCRIPTION OF PROCEDURE: The patient was brought to the operating room and placed supine on the operating table.  The patient had been signed prior to the procedure and this was documented. The patient had the anesthesia placed by the anesthesiologist.  A time-out was performed to confirm that this was the correct patient, site, side and location. The patient did receive antibiotics prior to the incision and was re-dosed during the procedure as needed at indicated intervals.  The patient was then positioned into the beach chair position with all bony prominences well padded and neutral C spine. The patient had the operative extremity prepped and draped in the standard surgical fashion.    We first made a transverse incision in the proximal palmar crease in line with the index finger.  Hemostasis was obtained.   Dissection was carried down onto the flexor tendon sheath.  The proximal edge of the A1 pulley was identified and released along its entire length under direct visualization with tenotomy scissors.  She did exhibit mild tenosynovitis of the flexor tendons for which tenolysis was performed.  The tendon itself did not exhibit any tears.  This was then thoroughly irrigated.  Hemostasis was obtained.  Skin was closed with interrupted nylon sutures.  Sterile dressings were applied.  We then turned our attention to the shoulder portion of the surgery.  Incisions were made for shoulder arthroscopy portals.  Diagnostic shoulder arthroscopy was first performed.  We encountered significant amount of circumferential degenerative labral tears.  We also encountered a degenerative superior labral anchor.  There was mild tendinopathy of the proximal biceps tendon.  She had articular surface tendinopathy of the supraspinatus which was debrided.  Biceps tenotomy was performed.  Degenerative labrum was debrided.  We then reposition the arthroscope into the subacromial space.  Subacromial decompression with acromioplasty and CA ligament release were performed.  We then remove the thick subacromial and subdeltoid bursa using oscillating shaver.  The arm was then externally rotated and the full-thickness nonretracted supraspinatus tear was identified.  This was then debrided back to healthy tissue the greater tuberosity was prepared with a high-speed bur.  A single row 4 strand repair of the supraspinatus tendon was performed using a single 4.75 mm self punching swivel lock.  Local anesthetic was placed in the subacromial and glenohumeral space.  Incisions were closed with interrupted nylon sutures.  Sterile dressings were applied.  Shoulder immobilizer was placed.  Patient tolerated procedure well no me complications.  POSTOPERATIVE PLAN: Discharge home.  Remain  in sling at all times.  Follow-up in the office in 1 week.  Azucena Cecil, MD [facility] [phone] 2:17 PM

## 2019-05-07 NOTE — Anesthesia Procedure Notes (Signed)
Procedure Name: Intubation Date/Time: 05/07/2019 12:32 PM Performed by: Nadiyah Zeis T, CRNA Pre-anesthesia Checklist: Patient identified, Emergency Drugs available, Suction available and Patient being monitored Patient Re-evaluated:Patient Re-evaluated prior to induction Oxygen Delivery Method: Circle system utilized Preoxygenation: Pre-oxygenation with 100% oxygen Induction Type: IV induction Ventilation: Mask ventilation without difficulty Laryngoscope Size: Miller and 2 Grade View: Grade I Tube type: Oral Tube size: 7.5 mm Number of attempts: 1 Airway Equipment and Method: Stylet and Oral airway Placement Confirmation: ETT inserted through vocal cords under direct vision,  positive ETCO2 and breath sounds checked- equal and bilateral Secured at: 22 cm Tube secured with: Tape Dental Injury: Teeth and Oropharynx as per pre-operative assessment

## 2019-05-07 NOTE — Transfer of Care (Signed)
Immediate Anesthesia Transfer of Care Note  Patient: [name]  Procedure(s) Performed: LEFT SHOULDER ARTHROSCOPY WITH DEBRIDEMENT, DISTAL CLAVICLE EXCISION,  SUBACROMIAL DECOMPRESSION AND POSSIBLE ROTATOR CUFF REPAIR (Left Shoulder) RELEASE TRIGGER FINGER LEFT INDEX FINGER (Left )  Patient Location: PACU  Anesthesia Type:General  Level of Consciousness: drowsy  Airway & Oxygen Therapy: Patient Spontanous Breathing and Patient connected to face mask oxygen  Post-op Assessment: Report given to RN, Post -op Vital signs reviewed and stable and Patient moving all extremities  Post vital signs: Reviewed and stable  Last Vitals:  Vitals Value Taken Time  BP    Temp    Pulse 70 05/07/19 1451  Resp 26 05/07/19 1451  SpO2 94 % 05/07/19 1451  Vitals shown include unvalidated device data.  Last Pain:  Vitals:   05/07/19 1102  TempSrc:   PainSc: 10-Worst pain ever         Complications: No apparent anesthesia complications

## 2019-05-07 NOTE — H&P (Signed)
PREOPERATIVE H&P  Chief Complaint: left shoulder rotator cuff tear, left index finger trigger finger  HPI: [name] is a 54 y.o. female who presents for surgical treatment of left shoulder rotator cuff tear, left index finger trigger finger.  She denies any changes in medical history.  Past Medical History:  Diagnosis Date  . Alcoholism ([place])   . Anxiety   . Arthritis 04-10-11   hips, shoulders, back  . Asthma   . Bipolar disorder ([place])   . Cardiomegaly   . Cervical cancer ([place]) 1993   cervical, no treatment done, went away per pt  . Cervical dysplasia or atypia 04-10-11   '93- once dx.-got pregnant-no intervention, then postpartum, no dysplasia found  . CHF (congestive heart failure) ([place])    no cardiologist 2014 dx, none now  . Chronic hypoxemic respiratory failure ([place])   . Chronic kidney disease    stage 3 kidney disease  . 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 Pathology showed Condyloma, gluteal CONDYLOMA ACUMINATUM.   [name] Kitchen COPD (chronic obstructive pulmonary disease) ([place])   . Depression   . Diabetes mellitus without complication ([place])   . Dyspnea    with actity, sitting  . Fall 12/16/2016  . Fibrocystic breast disease   . GERD (gastroesophageal reflux disease)   . Grade I diastolic dysfunction 53/74/8270   Noted on ECHO  . Hepatitis    hep B-count is low at present,doesn't register  . History of PSVT (paroxysmal supraventricular tachycardia)   . Hypercalcemia   . Hyperlipidemia   . Hypertension   . Incomplete left bundle branch block (LBBB) 09/24/2018   Noted on EKG  . Lung nodule 11/2017   right and left lung  . Migraine headache    none recent  . Morbid obesity ([place]) 03/27/2006  . Neuropathy   . Pneumonia    walking pneu 15 yrs. ago  . Skin lesion 03/15/2011   In gluteal crease now s/p removal by Dr. [name] of General Surgery on 3/12. Path shows condyloma.     . Sleep apnea 04-10-11   uses cpap, pt does not know settings   . Trigger finger    left third  . Urge incontinence of urine    Past Surgical History:  Procedure Laterality Date  . ANAL FISTULECTOMY  04/17/2011   Procedure: FISTULECTOMY ANAL;  Surgeon: Imogene Burn. [name], MD;  Location: [facility];  Service: General;  Laterality: N/A;  Excision of Condyloma Gluteal Cleft   . ANAL RECTAL MANOMETRY N/A 03/26/2019   Procedure: ANO RECTAL MANOMETRY;  Surgeon: Thornton Park, MD;  Location: [facility];  Service: Gastroenterology;  Laterality: N/A;  . BIOPSY  03/09/2019   Procedure: BIOPSY;  Surgeon: Thornton Park, MD;  Location: [facility];  Service: Gastroenterology;;  . Lum [place] INJECTION N/A 12/29/2018   Procedure: BOTOX INJECTION(100 UNITS)  WITH CYSTOSCOPY;  Surgeon: Ceasar Mons, MD;  Location: [facility];  Service: Urology;  Laterality: N/A;  . BREAST CYST EXCISION     bilateral breast, 3 cysts removed from each breast  . BREAST EXCISIONAL BIOPSY Right    x 3  . BREAST EXCISIONAL BIOPSY Left    x 3  . CARPAL TUNNEL RELEASE Bilateral   . Cervical biospy    . CERVICAL FUSION  04-10-11   x3- cervical fusion with plating and screws-Dr. [name]  . COLONOSCOPY WITH PROPOFOL N/A 07/06/2015   Procedure: COLONOSCOPY WITH PROPOFOL;  Surgeon: [name], MD;  [facility];  Service: Endoscopy;  Laterality: N/A;  . COLONOSCOPY WITH PROPOFOL N/A 03/09/2019   Procedure: COLONOSCOPY WITH PROPOFOL;  Surgeon: Thornton Park, MD;  Location: [facility];  Service: Gastroenterology;  Laterality: N/A;  . DOPPLER ECHOCARDIOGRAPHY  04/06/2012   AT [place] 55-60%  . FOOT SURGERY Left 2018  . FRACTURE SURGERY     little toe left foot  . I & D EXTREMITY Left 12/18/2016   Procedure: IRRIGATION AND DEBRIDEMENT LEFT FOOT, CLOSURE;  Surgeon: [name], MD;  Location: [place];  Service: Orthopedics;  Laterality: Left;  . [place] SURGERY  04-10-11   x5-Lumbar fusion-retained hardware.(Dr. Louanne Skye)  . MULTIPLE TOOTH EXTRACTIONS    . POLYPECTOMY   03/09/2019   Procedure: POLYPECTOMY;  Surgeon: Thornton Park, MD;  [facility];  Service: Gastroenterology;;  . TEE WITHOUT CARDIOVERSION N/A 04/06/2012   Procedure: TRANSESOPHAGEAL ECHOCARDIOGRAM (TEE);  Surgeon: [name], MD;  Location: [facility];  Service: Cardiovascular;  Laterality: N/A;  . TRIGGER FINGER RELEASE Right    middle finger  . TRIGGER FINGER RELEASE Left 06/28/2016   Procedure: RELEASE TRIGGER FINGER LEFT 3RD FINGER;  Surgeon: [name], MD;  Location: [place];  Service: Orthopedics;  Laterality: Left;  . TRIGGER FINGER RELEASE Right 06/12/2016   Procedure: RIGHT INDEX FINGER TRIGGER RELEASE;  Surgeon: [name], MD;  Location: [place];  Service: Orthopedics;  Laterality: Right;  . TRIGGER FINGER RELEASE Right 01/19/2018   Procedure: RIGHT RING FINGER TRIGGER FINGER RELEASE;  Surgeon: [name], MD;  Location: [place];  Service: Orthopedics;  Laterality: Right;   Social History   Socioeconomic History  . Marital status: Single    Spouse name: Not on file  . Number of children: 1  . Years of education: 31  . Highest education level: 12th grade  Occupational History  . Occupation: disabled    Comment: Disabled  Tobacco Use  . Smoking status: Current Every Day Smoker    Packs/day: 0.20    Years: 40.00    Pack years: 8.00    Types: Cigarettes    Start date: 01/28/1977  . Smokeless tobacco: Never Used  . Tobacco comment: 4-5 cigarettes per day  Substance and Sexual Activity  . Alcohol use: No    Alcohol/week: 0.0 standard drinks  . Drug use: Not Currently    Types: Marijuana, "Crack" cocaine    Comment: hx marijuana usemany years ago, Crack -none since 11/2015  . Sexual activity: Not Currently    Partners: Male    Birth control/protection: Post-menopausal  Other Topics Concern  . Not on file  Social History Narrative   Current Social History       Who lives at home: Patient lives alone in one level home; has 4 steps onto porch with a  handrail. Has smoke alarms, no throw rugs. Has elevated toilet. Has shower bench in tub with grab rails.    Transportation: Patient has own vehicle and drives herself    Important Relationships "Family and friends"    Pets: None    Likes to eat varied diet. Seafood, fish, roasted [place] breast, vegetables and salads and fruits.   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 I'm a lovable person."                                                                                                   Social Determinants of Health   Financial Resource Strain:   . Difficulty of Paying Living Expenses:   Food Insecurity:   . Worried About Charity fundraiser in the Last Year:   . Arboriculturist in the Last Year:   Transportation Needs:   . Film/video editor (Medical):   [name] Kitchen Lack of Transportation (Non-Medical):   Physical Activity:   . Days of Exercise per Week:   . Minutes of Exercise per Session:   Stress:   . Feeling of Stress :   Social Connections:   . Frequency of Communication with Friends and Family:   . Frequency of Social Gatherings with Friends and Family:   . Attends Religious Services:   . Active Member of Clubs or Organizations:   . Attends Archivist Meetings:   [name] Kitchen Marital Status:    Family History  Problem Relation Age of Onset  . Pulmonary embolism Mother   . Stroke Father   . Alcohol abuse Father   . Pneumonia Father   . Hypertension Father   . Breast cancer Maternal Aunt   . Hypertension Other   . Diabetes Other        Aunts and cousins  . Asthma Sister   . Depression Sister   . Arthritis Sister   . Sickle cell anemia Son    Allergies  Allergen Reactions  . Aspirin Other (See Comments)    Kidney  disease  . Chantix [Varenicline Tartrate] Other (See Comments)    On the 1 mg dose, patient experienced hallucintation. Patient able to tolerate 0.16m BID   . 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 Corticosteroids Other (See Comments)    Hyperglycemia   . Levofloxacin Itching   Prior to Admission medications   Medication Sig Start Date End Date Taking? Authorizing Provider  acetaminophen (TYLENOL) 500 MG tablet Take 1,000 mg by mouth 2 (two) times daily.    Yes [provider]  albuterol (VENTOLIN HFA) 108 (90 Base) MCG/ACT inhaler Inhale 2 puffs into the lungs every 6 (six) hours as needed for wheezing or shortness of breath. 03/22/19 06/20/19 Yes Cresenzo, VChristy Sartorius MD  atorvastatin (LIPITOR) 40 MG tablet TAKE 1 TABLET BY MOUTH EVERY DAY Patient taking differently: Take 40 mg by mouth daily.  12/29/18  Yes CGifford Shave MD  b complex vitamins capsule Take 1 capsule by mouth daily.   Yes [provider]  baclofen (LIORESAL) 10 MG tablet Take 1 tablet (10 mg total) by mouth 3 (three) times daily as needed for muscle spasms. Patient taking differently: Take 10 mg by mouth 3 (three) times daily.  03/24/18  Yes FEverrett Coombe MD  busPIRone (BUSPAR) 10 MG tablet TAKE 1 TABLET BY MOUTH THREE TIMES A DAY Patient taking differently: Take 10 mg by mouth  3 (three) times daily.  06/25/18  Yes Riccio, Levada Dy C, DO  diclofenac Sodium (VOLTAREN) 1 % GEL Apply 2 g topically 4 (four) times daily. 02/12/19  Yes [name], MD  diltiazem (CARDIZEM CD) 120 MG 24 hr capsule Take 1 capsule (120 mg total) by mouth 2 (two) times daily. 12/21/18 12/16/19 Yes [name], MD  docusate sodium (COLACE) 100 MG capsule Take 200 mg by mouth 2 (two) times daily.   Yes [provider]  empagliflozin (JARDIANCE) 25 MG TABS tablet Take 25 mg by mouth daily.   Yes [provider]  febuxostat (ULORIC) 40 MG tablet Take 40 mg by mouth at bedtime.   05/27/18  Yes [provider]  fluticasone (FLONASE) 50 MCG/ACT nasal spray Place 2 sprays into both nostrils 2 (two) times daily. 02/03/19  Yes [name], MD  Fluticasone-Umeclidin-Vilant (TRELEGY ELLIPTA) 100-62.5-25 MCG/INH AEPB Inhale 1 puff into the lungs daily. 03/03/19  Yes Icard, Octavio Graves, DO  folic acid (FOLVITE) 147 MCG tablet Take 400 mcg by mouth daily.   Yes [provider]  furosemide (LASIX) 40 MG tablet TAKE 2 TABLETS (80 MG TOTAL) BY MOUTH 2 (TWO) TIMES DAILY. 04/20/19  Yes [name], MD  gabapentin (NEURONTIN) 300 MG capsule TAKE 2 CAPSULES (600 MG TOTAL) BY MOUTH 3 (THREE) TIMES DAILY. 08/24/18  Yes Jessy Oto, MD  HUMALOG KWIKPEN 100 UNIT/ML KwikPen INJECT 3 UNITS INTO THE SKIN 3 TIMES DAILY BEFORE MEALS Patient taking differently: Inject 8 Units into the skin 3 (three) times daily before meals.  04/19/19  Yes [name], MD  HYDROcodone-acetaminophen (NORCO) 7.5-325 MG tablet Take 1 tablet by mouth every 6 (six) hours as needed for moderate pain. Patient taking differently: Take 1 tablet by mouth in the morning, at noon, in the evening, and at bedtime.  01/27/18  Yes [name], MD  hydrOXYzine (ATARAX/VISTARIL) 10 MG tablet TAKE 1 TABLET BY MOUTH EVERY DAY Patient taking differently: Take 10 mg by mouth daily.  03/26/19  Yes [name], MD  Insulin Glargine (LANTUS SOLOSTAR) 100 UNIT/ML Solostar Pen Inject 22 Units into the skin daily before breakfast. Patient taking differently: Inject 20 Units into the skin daily before breakfast.  06/02/18  Yes Orson Eva J, DO  ketoconazole (NIZORAL) 2 % cream APPLY 1 FINGERTIP AMOUNT TO EACH FOOT DAILY. Patient taking differently: Apply 1 application topically 2 (two) times daily.  11/19/18  Yes Gardiner Barefoot, DPM  loratadine (CLARITIN) 10 MG tablet TAKE 1 TABLET BY MOUTH EVERY DAY Patient taking differently: Take 10 mg by mouth daily.  03/22/19  Yes [name], MD  losartan (COZAAR)  100 MG tablet Take 1 tablet (100 mg total) by mouth daily. 05/29/17  Yes [name], MD  metolazone (ZAROXOLYN) 2.5 MG tablet Take 1 tablet (2.5 mg total) by mouth 2 (two) times a week. TAKE 1 TABLET TWICE WEEKLY ON MONDAYS AND THURSDAYS ONLY Patient taking differently: Take 2.5 mg by mouth See admin instructions. TAKE 2.5 BY MOUTH  TWICE WEEKLY ON MONDAYS AND THURSDAYS ONLY 06/18/18  Yes [name], MD  Multiple Vitamins-Minerals (MULTIVITAMIN WITH MINERALS) tablet Take 1 tablet by mouth daily.   Yes [provider]  mupirocin ointment (BACTROBAN) 2 % APPLY TO AFFECTED AREA TWICE A DAY Patient taking differently: Apply 1 application topically 2 (two) times daily as needed (skin fold (stomach)).  09/09/18  Yes [name], MD  [facility] 4 MG/0.1ML LIQD nasal spray kit Place 1 spray into the nose as needed (accidental  overdose).  02/16/18  Yes [provider]  nicotine polacrilex (NICORETTE) 4 MG gum CHEW 1 EACH (4 MG TOTAL) BY MOUTH AS NEEDED FOR SMOKING CESSATION. Patient taking differently: Take 4 mg by mouth as needed for smoking cessation.  03/12/19  Yes [name], MD  nystatin (MYCOSTATIN) 100000 UNIT/ML suspension Take 5 mLs (500,000 Units total) by mouth 4 (four) times daily. Swish and swallow. Patient taking differently: Take 5 mLs by mouth 4 (four) times daily as needed (dry mouth). Swish and swallow. 11/19/18  Yes Hensel, Jamal Collin, MD  Omega-3 Fatty Acids (FISH OIL) 1000 MG CAPS Take 1,000 mg by mouth daily.    Yes [provider]  omeprazole (PRILOSEC) 20 MG capsule Take 1 capsule (20 mg total) by mouth daily. 02/12/19 05/13/19 Yes [name], MD  OXYGEN Inhale 2 L into the lungs continuous.    Yes [provider]  potassium chloride SA (KLOR-CON M20) 20 MEQ tablet Take 1 tablet (20 mEq total) by mouth 2 (two) times a week. Take 1 tablet twice weekly on Monday and Thursday. Patient taking differently: Take 20 mEq by mouth See admin  instructions. Take 20 mEq by mouth twice weekly on Monday and Thursday. 05/18/18  Yes [name], MD  promethazine (PHENERGAN) 25 MG tablet Take 1 tablet (25 mg total) by mouth every 6 (six) hours as needed for nausea. 01/19/18  Yes [name], MD  varenicline (CHANTIX) 0.5 MG tablet Take 1 tablet (0.5 mg total) by mouth 2 (two) times daily. 03/23/19  Yes Hensel, Jamal Collin, MD  Accu-Chek Softclix Lancets lancets TEST 4 TIMES DAILY 04/27/19   [name], MD  ammonium lactate (LAC-HYDRIN) 12 % lotion APPLY TWICE A DAY AS NEEDED FOR DRY SKIN 05/03/19   [name], MD  blood glucose meter kit and supplies KIT Dispense based on patient and insurance preference. Use up to four times daily as directed. (FOR ICD-9 250.00, 250.01). 12/20/16   Bonnita Hollow, MD  glucose blood (ACCU-CHEK AVIVA PLUS) test strip TEST 4 TIMES DAILY Patient taking differently: 1 each by Other route 3 (three) times daily. TEST 3 TIMES DAILY 08/04/18   [name], MD  Insulin Pen Needle (B-D UF III MINI PEN NEEDLES) 31G X 5 MM MISC CHECK SUGARS 4 TIMES A DAY BEFORE MEALS AND AT BEDTIME. 12/31/18   Hensel, Jamal Collin, MD  montelukast (SINGULAIR) 10 MG tablet TAKE 1 TABLET BY MOUTH EVERYDAY AT BEDTIME Patient taking differently: Take 10 mg by mouth at bedtime.  11/16/18   [name], MD  NON FORMULARY Uses a C-PAP at bedtime    [provider]  sertraline (ZOLOFT) 50 MG tablet TAKE 1 TABLET BY MOUTH EVERY DAY 05/03/19   [name], MD  atorvastatin (LIPITOR) 40 MG tablet Take 1 tablet (40 mg total) by mouth daily. 12/31/17   [name], MD  CHANTIX 0.5 MG tablet TAKE 1 TABLET (0.5 MG TOTAL) BY MOUTH 2 (TWO) TIMES DAILY. 10/29/18   [name], MD  nicotine polacrilex ([employer] NICOTINE POLACRILEX) 4 MG gum Take 1 each (4 mg total) by mouth as needed for smoking cessation. 12/21/18   Zenia Resides, MD     Positive ROS: All other systems have been reviewed and were otherwise negative  with the exception of those mentioned in the HPI and as above.  Physical Exam: General: Alert, no acute distress Cardiovascular: No pedal edema Respiratory: No cyanosis, no use of accessory musculature GI: abdomen soft Skin: No lesions in the  area of chief complaint Neurologic: Sensation intact distally Psychiatric: Patient is competent for consent with normal mood and affect Lymphatic: no lymphedema  MUSCULOSKELETAL: exam stable  Assessment: left shoulder rotator cuff tear, left index finger trigger finger  Plan: Plan for Procedure(s): LEFT SHOULDER ARTHROSCOPY WITH DEBRIDEMENT, DISTAL CLAVICLE EXCISION,  SUBACROMIAL DECOMPRESSION AND POSSIBLE ROTATOR CUFF REPAIR RELEASE TRIGGER FINGER LEFT INDEX FINGER  The risks benefits and alternatives were discussed with the patient including but not limited to the risks of nonoperative treatment, versus surgical intervention including infection, bleeding, nerve injury,  blood clots, cardiopulmonary complications, morbidity, mortality, among others, and they were willing to proceed.   Eduard Roux, MD   05/07/2019 7:22 AM

## 2019-05-07 NOTE — Discharge Instructions (Signed)
° ° °  Post-operative patient instructions  Shoulder Arthroscopy    Ice:  Place intermittent ice or cooler pack over your shoulder, 30 minutes on and 30 minutes off.  Continue this for the first 72 hours after surgery, then save ice for use after therapy sessions or on more active days.    Weight:  You may NOT bear weight on your arm as your symptoms allow.  Motion:  Perform gentle shoulder motion as tolerated  Dressing:  Perform 1st dressing change at 2 days postoperative. A moderate amount of blood tinged drainage is to be expected.  So if you bleed through the dressing on the first or second day or if you have fevers, it is fine to change the dressing/check the wounds early and redress wound.  If it bleeds through again, or if the incisions are leaking frank blood, please call the office. May change dressing every 1-2 days thereafter to help watch wounds. Can purchase Tegaderm (or 65M Nexcare) water resistant dressings at local pharmacy / [employer].  Shower:  Light shower is ok after 2 days.  Please take shower, NO bath. Recover with gauze and ace wrap to help keep wounds protected.    Pain medication:  A narcotic pain medication has been prescribed.  Take as directed.  Typically you need narcotic pain medication more regularly during the first 3 to 5 days after surgery.  Decrease your use of the medication as the pain improves.  Narcotics can sometimes cause constipation, even after a few doses.  If you have problems with constipation, you can take an over the counter stool softener or light laxative.  If you have persistent problems, please notify your physicians office.  Physical therapy: Additional activity guidelines to be provided by your physician or physical therapist at follow-up visits.   Driving: Do not recommend driving x 2 weeks post surgical, especially if surgery performed on right side. Should not drive while taking narcotic pain medications. It typically takes at least 2 weeks to  restore sufficient neuromuscular function for normal reaction times for driving safety.   Call [phone] for questions or problems. Evenings you will be forwarded to the hospital operator.  Ask for the orthopaedic physician on call. Please call if you experience:    o Redness, foul smelling, or persistent drainage from the surgical site  o worsening shoulder pain and swelling not responsive to medication  o any calf pain and or swelling of the lower leg  o temperatures greater than 101.5 F o other questions or concerns   Thank you for allowing [place] to be a part of your care.

## 2019-05-10 ENCOUNTER — Encounter: Payer: Self-pay | Admitting: *Deleted

## 2019-05-10 DIAGNOSIS — K5902 Outlet dysfunction constipation: Secondary | ICD-10-CM

## 2019-05-10 NOTE — Progress Notes (Signed)
Community message has been sent to [facility].

## 2019-05-12 ENCOUNTER — Ambulatory Visit: Payer: Medicare Other | Admitting: Physical Therapy

## 2019-05-13 ENCOUNTER — Encounter: Payer: Self-pay | Admitting: *Deleted

## 2019-05-13 ENCOUNTER — Encounter: Payer: Self-pay | Admitting: Adult Health

## 2019-05-14 ENCOUNTER — Ambulatory Visit: Payer: Medicare Other | Admitting: Podiatry

## 2019-05-14 ENCOUNTER — Other Ambulatory Visit: Payer: Self-pay

## 2019-05-14 ENCOUNTER — Inpatient Hospital Stay: Payer: Medicare Other | Admitting: Orthopaedic Surgery

## 2019-05-14 DIAGNOSIS — Z794 Long term (current) use of insulin: Secondary | ICD-10-CM

## 2019-05-14 DIAGNOSIS — E1122 Type 2 diabetes mellitus with diabetic chronic kidney disease: Secondary | ICD-10-CM

## 2019-05-14 MED ORDER — ACCU-CHEK AVIVA PLUS VI STRP: 1.0000 | ORAL_STRIP | Freq: Three times a day (TID) | 3 refills | Status: DC

## 2019-05-18 ENCOUNTER — Encounter: Payer: Self-pay | Admitting: Orthopaedic Surgery

## 2019-05-18 ENCOUNTER — Ambulatory Visit (INDEPENDENT_AMBULATORY_CARE_PROVIDER_SITE_OTHER): Payer: Medicare Other | Admitting: Orthopaedic Surgery

## 2019-05-18 ENCOUNTER — Encounter: Payer: Self-pay | Admitting: Family Medicine

## 2019-05-18 ENCOUNTER — Other Ambulatory Visit: Payer: Self-pay

## 2019-05-18 ENCOUNTER — Other Ambulatory Visit: Payer: Self-pay | Admitting: Family Medicine

## 2019-05-18 DIAGNOSIS — Z794 Long term (current) use of insulin: Secondary | ICD-10-CM

## 2019-05-18 DIAGNOSIS — E1122 Type 2 diabetes mellitus with diabetic chronic kidney disease: Secondary | ICD-10-CM

## 2019-05-18 DIAGNOSIS — M75102 Unspecified rotator cuff tear or rupture of left shoulder, not specified as traumatic: Secondary | ICD-10-CM

## 2019-05-18 NOTE — Progress Notes (Signed)
Post-Op Visit Note   Patient: [name]           Date of Birth: 1965/08/15           MRN: [id number] Visit Date: 05/18/2019 PCP: [name], MD   Assessment & Plan:  Chief Complaint:  Chief Complaint  Patient presents with  . Left Shoulder - Pain  . Left Hand - Pain   Visit Diagnoses:  1. Nontraumatic tear of left supraspinatus tendon     Plan: [name] is 1 week status post left shoulder scope and rotator cuff repair and left index trigger finger release.  Overall she is doing well.  She has been compliant with the sling.  Incisions are healing.  We remove the shoulder cyst sutures today.  We will take out the hand sutures next week.  Prescription for physical therapy was submitted today.  Questions encouraged and answered.  We will see her next week for suture removal from the left hand.  Follow-Up Instructions: Return in about 1 week (around 05/25/2019).   Orders:  Orders Placed This Encounter  Procedures  . Ambulatory referral to Physical Therapy   No orders of the defined types were placed in this encounter.   Imaging: No results found.  PMFS History: Patient Active Problem List   Diagnosis Date Noted  . Constipation due to outlet dysfunction   . Impingement syndrome of left shoulder 05/07/2019  . Polyp of colon   . Polyp of ascending colon   . Rectal discomfort 02/12/2019  . Osteoarthritis of right hip 02/12/2019  . Nontraumatic tear of left supraspinatus tendon 12/30/2018  . Gout 12/17/2018  . Nontraumatic complete tear of right rotator cuff 12/08/2018  . Rotator cuff tear, right 12/01/2018  . Chest pain 09/24/2018  . Right groin pain 07/20/2018  . Depressed mood 07/13/2018  . Mouth pain 06/02/2018  . Paroxysmal SVT (supraventricular tachycardia) ([place]) 03/20/2018  . Pure hypercholesterolemia 03/20/2018  . Muscle cramping 02/25/2018  . Nodule of upper lobe of right lung 02/13/2018  . Falls, subsequent encounter 01/30/2018  . Pain in left  foot 11/04/2017  . Open wound of left foot 11/04/2017  . Essential hypertension 10/27/2017  . Solitary pulmonary nodule 10/07/2017  . Restrictive lung disease secondary to obesity 10/01/2017  . Polycythemia, secondary 10/01/2017  . Chronic viral hepatitis B without delta-agent ([place]) 07/08/2017  . COPD (chronic obstructive pulmonary disease) with chronic bronchitis ([place]) 06/27/2017  . Chronic kidney disease (CKD), stage IV (severe) ([place]) 01/14/2017  . Type 2 diabetes mellitus with stage 3 chronic kidney disease, with long-term current use of insulin ([place]) 12/12/2016  . Urge incontinence of urine 12/05/2016  . Trigger finger, left index finger 07/15/2016  . Back pain 04/07/2014  . Morbid obesity ([place]) 10/02/2012  . Chronic diastolic CHF (congestive heart failure) ([place]) 04/07/2012  . GERD 01/26/2010  . Obstructive sleep apnea treated with BiPAP 02/03/2008  . FIBROCYSTIC BREAST DISEASE 10/28/2006  . Hyperlipidemia 03/27/2006  . Obesity hypoventilation syndrome ([place]) 03/27/2006  . Former tobacco use 03/27/2006  . Depression with anxiety 03/27/2006   Past Medical History:  Diagnosis Date  . Alcoholism ([place])   . Anxiety   . Arthritis 04-10-11   hips, shoulders, back  . Asthma   . Bipolar disorder ([place])   . Cardiomegaly   . Cervical cancer ([place]) 1993   cervical, no treatment done, went away per pt  . Cervical dysplasia or atypia 04-10-11   '93- once dx.-got pregnant-no intervention, then postpartum, no dysplasia found  .  CHF (congestive heart failure) ([place])    no cardiologist 2014 dx, none now  . Chronic hypoxemic respiratory failure ([place])   . Chronic kidney disease    stage 3 kidney disease  . 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 Pathology showed Condyloma, gluteal CONDYLOMA ACUMINATUM.   [name] Kitchen COPD (chronic obstructive pulmonary disease) ([place])   . Depression   . Diabetes mellitus without complication ([place])   . Dyspnea    with actity, sitting  . Fall  12/16/2016  . Fibrocystic breast disease   . GERD (gastroesophageal reflux disease)   . Grade I diastolic dysfunction 32/44/0102   Noted on ECHO  . Hepatitis    hep B-count is low at present,doesn't register  . History of PSVT (paroxysmal supraventricular tachycardia)   . Hypercalcemia   . Hyperlipidemia   . Hypertension   . Incomplete left bundle branch block (LBBB) 09/24/2018   Noted on EKG  . Lung nodule 11/2017   right and left lung  . Migraine headache    none recent  . Morbid obesity ([place]) 03/27/2006  . Neuropathy   . Pneumonia    walking pneu 15 yrs. ago  . Skin lesion 03/15/2011   In gluteal crease now s/p removal by Dr. [name] of General Surgery on 3/12. Path shows condyloma.     . Sleep apnea 04-10-11   uses cpap, pt does not know settings  . Trigger finger    left third  . Urge incontinence of urine     Family History  Problem Relation Age of Onset  . Pulmonary embolism Mother   . Stroke Father   . Alcohol abuse Father   . Pneumonia Father   . Hypertension Father   . Breast cancer Maternal Aunt   . Hypertension Other   . Diabetes Other        Aunts and cousins  . Asthma Sister   . Depression Sister   . Arthritis Sister   . Sickle cell anemia Son     Past Surgical History:  Procedure Laterality Date  . ANAL FISTULECTOMY  04/17/2011   Procedure: FISTULECTOMY ANAL;  Surgeon: Imogene Burn. [name], MD;  Location: [facility];  Service: General;  Laterality: N/A;  Excision of Condyloma Gluteal Cleft   . ANAL RECTAL MANOMETRY N/A 03/26/2019   Procedure: ANO RECTAL MANOMETRY;  Surgeon: Thornton Park, MD;  Location: [facility];  Service: Gastroenterology;  Laterality: N/A;  . BIOPSY  03/09/2019   Procedure: BIOPSY;  Surgeon: Thornton Park, MD;  Location: [facility];  Service: Gastroenterology;;  . Lum [place] INJECTION N/A 12/29/2018   Procedure: BOTOX INJECTION(100 UNITS)  WITH CYSTOSCOPY;  Surgeon: Ceasar Mons, MD;  Location: [facility];  Service: Urology;   Laterality: N/A;  . BREAST CYST EXCISION     bilateral breast, 3 cysts removed from each breast  . BREAST EXCISIONAL BIOPSY Right    x 3  . BREAST EXCISIONAL BIOPSY Left    x 3  . CARPAL TUNNEL RELEASE Bilateral   . Cervical biospy    . CERVICAL FUSION  04-10-11   x3- cervical fusion with plating and screws-Dr. [name]  . COLONOSCOPY WITH PROPOFOL N/A 07/06/2015   Procedure: COLONOSCOPY WITH PROPOFOL;  Surgeon: [name], MD;  Location: [facility];  Service: Endoscopy;  Laterality: N/A;  . COLONOSCOPY WITH PROPOFOL N/A 03/09/2019   Procedure: COLONOSCOPY WITH PROPOFOL;  Surgeon: Thornton Park, MD;  Location: [facility];  Service: Gastroenterology;  Laterality: N/A;  . DOPPLER ECHOCARDIOGRAPHY  04/06/2012   AT [place] 55-60%  . FOOT SURGERY Left 2018  . FRACTURE SURGERY     little toe left foot  . I & D EXTREMITY Left 12/18/2016   Procedure: IRRIGATION AND DEBRIDEMENT LEFT FOOT, CLOSURE;  Surgeon: [name], MD;  Location: [place];  Service: Orthopedics;  Laterality: Left;  . [place] SURGERY  04-10-11   x5-Lumbar fusion-retained hardware.(Dr. Louanne Skye)  . MULTIPLE TOOTH EXTRACTIONS    . POLYPECTOMY  03/09/2019   Procedure: POLYPECTOMY;  Surgeon: Thornton Park, MD;  [facility];  Service: Gastroenterology;;  . SHOULDER ARTHROSCOPY WITH ROTATOR CUFF REPAIR AND SUBACROMIAL DECOMPRESSION Left 05/07/2019   Procedure: LEFT SHOULDER ARTHROSCOPY WITH DEBRIDEMENT, DISTAL CLAVICLE EXCISION,  SUBACROMIAL DECOMPRESSION AND POSSIBLE ROTATOR CUFF REPAIR;  Surgeon: [name], MD;  Location: [place];  Service: Orthopedics;  Laterality: Left;  . TEE WITHOUT CARDIOVERSION N/A 04/06/2012   Procedure: TRANSESOPHAGEAL ECHOCARDIOGRAM (TEE);  Surgeon: [name], MD;  Location[facility];  Service: Cardiovascular;  Laterality: N/A;  . TRIGGER FINGER RELEASE Right    middle finger  . TRIGGER FINGER RELEASE Left 06/28/2016   Procedure: RELEASE TRIGGER FINGER LEFT 3RD FINGER;   Surgeon: [name], MD;  Location: [place];  Service: Orthopedics;  Laterality: Left;  . TRIGGER FINGER RELEASE Right 06/12/2016   Procedure: RIGHT INDEX FINGER TRIGGER RELEASE;  Surgeon: [name], MD;  Location: [place];  Service: Orthopedics;  Laterality: Right;  . TRIGGER FINGER RELEASE Right 01/19/2018   Procedure: RIGHT RING FINGER TRIGGER FINGER RELEASE;  Surgeon: [name], MD;  Location: [place];  Service: Orthopedics;  Laterality: Right;  . TRIGGER FINGER RELEASE Left 05/07/2019   Procedure: RELEASE TRIGGER FINGER LEFT INDEX FINGER;  Surgeon: [name], MD;  Location: [place];  Service: Orthopedics;  Laterality: Left;   Social History   Occupational History  . Occupation: disabled    Comment: Disabled  Tobacco Use  . Smoking status: Current Every Day Smoker    Packs/day: 0.20    Years: 40.00    Pack years: 8.00    Types: Cigarettes    Start date: 01/28/1977  . Smokeless tobacco: Never Used  . Tobacco comment: 4-5 cigarettes per day  Substance and Sexual Activity  . Alcohol use: No    Alcohol/week: 0.0 standard drinks  . Drug use: Not Currently    Types: Marijuana, "Crack" cocaine    Comment: hx marijuana usemany years ago, Crack -none since 11/2015  . Sexual activity: Not Currently    Partners: Male    Birth control/protection: Post-menopausal

## 2019-05-20 DIAGNOSIS — G4733 Obstructive sleep apnea (adult) (pediatric): Secondary | ICD-10-CM | POA: Diagnosis not present

## 2019-05-20 DIAGNOSIS — J449 Chronic obstructive pulmonary disease, unspecified: Secondary | ICD-10-CM | POA: Diagnosis not present

## 2019-05-24 ENCOUNTER — Ambulatory Visit: Payer: Medicare Other | Attending: Orthopaedic Surgery | Admitting: Physical Therapy

## 2019-05-24 ENCOUNTER — Encounter: Payer: Self-pay | Admitting: Physical Therapy

## 2019-05-24 ENCOUNTER — Other Ambulatory Visit: Payer: Self-pay

## 2019-05-24 DIAGNOSIS — R29898 Other symptoms and signs involving the musculoskeletal system: Secondary | ICD-10-CM | POA: Diagnosis not present

## 2019-05-24 DIAGNOSIS — M6281 Muscle weakness (generalized): Secondary | ICD-10-CM | POA: Insufficient documentation

## 2019-05-24 DIAGNOSIS — Z9889 Other specified postprocedural states: Secondary | ICD-10-CM | POA: Insufficient documentation

## 2019-05-24 DIAGNOSIS — M25512 Pain in left shoulder: Secondary | ICD-10-CM | POA: Insufficient documentation

## 2019-05-24 NOTE — Therapy (Signed)
[place], [place], [address] Phone: [phone]   Fax:  [phone]  Physical Therapy Evaluation  Patient Details  Name: [name] MRN: [id number] Date of Birth: 22-May-1965 Referring Provider (PT): Dr. Erlinda Hong    Encounter Date: 05/24/2019  PT End of Session - 05/24/19 1711    Visit Number  1    Number of Visits  16    Date for PT Re-Evaluation  07/19/19    Authorization Type  [employer] MCR and MCD    PT Start Time  1216    PT Stop Time  1300    PT Time Calculation (min)  44 min    Activity Tolerance  Patient tolerated treatment well    Behavior During Therapy  [facility] for tasks assessed/performed       Past Medical History:  Diagnosis Date  . Alcoholism ([place])   . Anxiety   . Arthritis 04-10-11   hips, shoulders, back  . Asthma   . Bipolar disorder ([place])   . Cardiomegaly   . Cervical cancer ([place]) 1993   cervical, no treatment done, went away per pt  . Cervical dysplasia or atypia 04-10-11   '93- once dx.-got pregnant-no intervention, then postpartum, no dysplasia found  . CHF (congestive heart failure) ([place])    no cardiologist 2014 dx, none now  . Chronic hypoxemic respiratory failure ([place])   . Chronic kidney disease    stage 3 kidney disease  . 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 Pathology showed Condyloma, gluteal CONDYLOMA ACUMINATUM.   [name] Kitchen COPD (chronic obstructive pulmonary disease) ([place])   . Depression   . Diabetes mellitus without complication ([place])   . Dyspnea    with actity, sitting  . Fall 12/16/2016  . Fibrocystic breast disease   . GERD (gastroesophageal reflux disease)   . Grade I diastolic dysfunction 62/26/3335   Noted on ECHO  . Hepatitis    hep B-count is low at present,doesn't register  . History of PSVT (paroxysmal supraventricular tachycardia)   . Hypercalcemia   . Hyperlipidemia   . Hypertension   . Incomplete left bundle branch block (LBBB)  09/24/2018   Noted on EKG  . Lung nodule 11/2017   right and left lung  . Migraine headache    none recent  . Morbid obesity ([place]) 03/27/2006  . Neuropathy   . Pneumonia    walking pneu 15 yrs. ago  . Skin lesion 03/15/2011   In gluteal crease now s/p removal by Dr. [name] of General Surgery on 3/12. Path shows condyloma.     . Sleep apnea 04-10-11   uses cpap, pt does not know settings  . Trigger finger    left third  . Urge incontinence of urine     Past Surgical History:  Procedure Laterality Date  . ANAL FISTULECTOMY  04/17/2011   Procedure: FISTULECTOMY ANAL;  Surgeon: Imogene Burn. [name], MD;  Location: [facility];  Service: General;  Laterality: N/A;  Excision of Condyloma Gluteal Cleft   . ANAL RECTAL MANOMETRY N/A 03/26/2019   Procedure: ANO RECTAL MANOMETRY;  Surgeon: Thornton Park, MD;  [facility];  Service: Gastroenterology;  Laterality: N/A;  . BIOPSY  03/09/2019   Procedure: BIOPSY;  Surgeon: Thornton Park, MD;  [facility];  Service: Gastroenterology;;  . Lum [place] INJECTION N/A 12/29/2018   Procedure: BOTOX INJECTION(100 UNITS)  WITH CYSTOSCOPY;  Surgeon: Ceasar Mons, MD;  [facility];  Service: Urology;  Laterality: N/A;  . BREAST CYST EXCISION     bilateral breast, 3 cysts removed from each breast  . BREAST EXCISIONAL BIOPSY Right    x 3  . BREAST EXCISIONAL BIOPSY Left    x 3  . CARPAL TUNNEL RELEASE Bilateral   . Cervical biospy    . CERVICAL FUSION  04-10-11   x3- cervical fusion with plating and screws-Dr. [name]  . COLONOSCOPY WITH PROPOFOL N/A 07/06/2015   Procedure: COLONOSCOPY WITH PROPOFOL;  Surgeon: [name], MD;  [facility];  Service: Endoscopy;  Laterality: N/A;  . COLONOSCOPY WITH PROPOFOL N/A 03/09/2019   Procedure: COLONOSCOPY WITH PROPOFOL;  Surgeon: Thornton Park, MD;  [facility];  Service: Gastroenterology;  Laterality: N/A;  . DOPPLER ECHOCARDIOGRAPHY  04/06/2012   AT [facility] 55-60%  . FOOT SURGERY Left 2018  . FRACTURE SURGERY     little toe left foot  . I & D EXTREMITY Left 12/18/2016   Procedure: IRRIGATION AND DEBRIDEMENT LEFT FOOT, CLOSURE;  Surgeon: [name], MD;  Location: [facility];  Service: Orthopedics;  Laterality: Left;  . [place] SURGERY  04-10-11   x5-Lumbar fusion-retained hardware.(Dr. Louanne Skye)  . MULTIPLE TOOTH EXTRACTIONS    . POLYPECTOMY  03/09/2019   Procedure: POLYPECTOMY;  Surgeon: Thornton Park, MD;  [facility];  Service: Gastroenterology;;  . SHOULDER ARTHROSCOPY WITH ROTATOR CUFF REPAIR AND SUBACROMIAL DECOMPRESSION Left 05/07/2019   Procedure: LEFT SHOULDER ARTHROSCOPY WITH DEBRIDEMENT, DISTAL CLAVICLE EXCISION,  SUBACROMIAL DECOMPRESSION AND POSSIBLE ROTATOR CUFF REPAIR;  Surgeon: [name], MD;  Location: [facility];  Service: Orthopedics;  Laterality: Left;  . TEE WITHOUT CARDIOVERSION N/A 04/06/2012   Procedure: TRANSESOPHAGEAL ECHOCARDIOGRAM (TEE);  Surgeon: [name], MD;  Location[facility];  Service: Cardiovascular;  Laterality: N/A;  . TRIGGER FINGER RELEASE Right    middle finger  . TRIGGER FINGER RELEASE Left 06/28/2016   Procedure: RELEASE TRIGGER FINGER LEFT 3RD FINGER;  Surgeon: [name], MD;  Location: [facility];  Service: Orthopedics;  Laterality: Left;  . TRIGGER FINGER RELEASE Right 06/12/2016   Procedure: RIGHT INDEX FINGER TRIGGER RELEASE;  Surgeon: [name], MD;  Location: [facility];  Service: Orthopedics;  Laterality: Right;  . TRIGGER FINGER RELEASE Right 01/19/2018   Procedure: RIGHT RING FINGER TRIGGER FINGER RELEASE;  Surgeon: [name], MD;  Location: [facility];  Service: Orthopedics;  Laterality: Right;  . TRIGGER FINGER RELEASE Left 05/07/2019   Procedure: RELEASE TRIGGER FINGER LEFT INDEX FINGER;  Surgeon: [name], MD;  Location: [facility];  Service: Orthopedics;  Laterality: Left;    There were no vitals filed for this visit.   Subjective Assessment - 05/24/19 1223     Subjective  Pt s/p shoulder surgery on 05/07/19 as well as trigger finger on same day.  She is very happy she had the surgery, having minimal pain.  "The doctor told me not to use my arm". She is in a wheelchair and cannot walk with her walker due to limitations in L UE.    Pertinent History  lengthy, see snapshot.  Kidney disease, L SAD, DCR and supraspinatus repair 05/07/19, 2 L 02 at all times    Limitations  House hold activities;Lifting;Standing;Walking    Patient Stated Goals  Patient wants to be able to lift her arm again.    Currently in Pain?  Yes    Pain Score  7     Pain Location  Shoulder  Pain Orientation  Left    Pain Descriptors / Indicators  Sore    Pain Type  Surgical pain    Pain Radiating Towards  L elbow    Pain Onset  1 to 4 weeks ago    Pain Frequency  Constant    Aggravating Factors   using her arm (minimal right now)    Pain Relieving Factors  prayer, pain meds, ice pack    Effect of Pain on Daily Activities  I just keep on doing my best.  Cannot take pain meds much due to kidney disease.    Multiple Pain Sites  Yes    Pain Score  10    Pain Location  Finger (Comment which one)    Pain Orientation  Left    Pain Descriptors / Indicators  Throbbing    Pain Type  Chronic pain    Pain Onset  1 to 4 weeks ago    Pain Frequency  Constant    Aggravating Factors   moving it    Pain Relieving Factors  rest         [facility] PT Assessment - 05/24/19 0001      Assessment   Medical Diagnosis  L shoulder surgery (RCR, SAD, DCR)     Referring Provider (PT)  Dr. Erlinda Hong     Onset Date/Surgical Date  05/07/19    Hand Dominance  Right    Prior Therapy  Yes       Precautions   Precautions  None    Precaution Comments  shoulder protocol    Required Braces or Orthoses  Sling      Restrictions   Weight Bearing Restrictions  No      Balance Screen   Has the patient fallen in the past 6 months  No      [place] residence    Living  Arrangements  Alone    Available Help at Discharge  Family    Type of [place] to enter    Entrance Stairs-Number of Steps  [place]  One level    [place] - 4 wheels   wheelchair    Additional Comments  had an aide and that didnt work out       Prior Function   Level of Independence  Independent with basic ADLs;Independent with household mobility without device;Independent with community mobility with device    Vocation  On disability    Vocation Requirements  used to be CNA, fast food,  is a "people person"     Leisure  watch TV, games, Bible , not going back to church        Cognition   Overall Cognitive Status  Within Functional Limits for tasks assessed      Observation/Other Assessments   Focus on Therapeutic Outcomes (FOTO)   wrong set up      Sensation   Light Touch  Appears Intact      Coordination   Gross Motor Movements are Fluid and Coordinated  Not tested      Posture/Postural Control   Posture/Postural Control  Postural limitations    Postural Limitations  Rounded Shoulders;Forward head    Posture Comments  scapular abduction , gH jt internal rotation       PROM   Overall PROM Comments  flexion to 130, abd  to 100, ER 70, IR 70       Strength   Right Shoulder Flexion  4/5    Right Shoulder ABduction  4/5    Right Shoulder Internal Rotation  4/5    Right Shoulder External Rotation  4/5    Left Shoulder Flexion  --   NT, appears2+/5 or less    Left Shoulder ABduction  --   NT du to surgery, appears 2+/5   Left Shoulder Internal Rotation  4/5    Left Shoulder External Rotation  3+/5      Palpation   Palpation comment  tender throughout grossly to L shoulder, mostly anterior/lateral and into ant , middle deltoid      Bed Mobility   Bed Mobility  Supine to Sit    Supine to Sit  Minimal Assistance - Patient > 75%      Transfers   Transfers  Sit to Stand;Stand Pivot Transfers     Sit to Stand  5: Supervision    Stand Pivot Transfers  5: Supervision    Stand Pivot Transfer Details (indicate cue type and reason)  verbal, managing O2 and wheelchair setup      Ambulation/Gait   Gait Comments  NT today, used to walking with walker but unable due to shoulder               Objective measurements completed on examination: See above findings.      [facility] Adult PT Treatment/Exercise - 05/24/19 0001      Self-Care   Self-Care  Posture;Heat/Ice Application;Other Self-Care Comments    Posture  seated    Heat/Ice Application  ice if pain increases    Other Self-Care Comments   HEP, AAROM, sling and strap for support , POC       Shoulder Exercises: ROM/Strengthening   Pendulum  seated for comfort x1 min     Other ROM/Strengthening Exercises  table slides AAROM bilateral flexion, scaption     Other ROM/Strengthening Exercises  scapular retraction x 10              PT Education - 05/24/19 1710    Education Details  PT/POC, HEP, precautions and AAROM    Person(s) Educated  Patient    Methods  Explanation;Verbal cues;Handout;Demonstration;Tactile cues    Comprehension  Verbalized understanding;Returned demonstration;Need further instruction       PT Short Term Goals - 05/24/19 1712      PT SHORT TERM GOAL #1   Title  Pt will be I with HEP for strength and ROM within limits of surgical protocol    Baseline  given AAROM on eval    Time  4    Period  Weeks    Status  New    Target Date  06/21/19      PT SHORT TERM GOAL #2   Title  Pt will be able to improve transfers in the clinic with mod I (stand-pivot and bed mobility)    Baseline  min A to supervision    Time  4    Period  Weeks    Status  New    Target Date  06/21/19      PT SHORT TERM GOAL #3   Title  Pt will be able to use L UE to assist with ADLs below shoulder height with min increase in pain    Baseline  PROM at this time, 3 weeks post op.    Time  4    Period  Weeks    Status   New    Target Date  06/21/19      PT SHORT TERM GOAL #4   Title  Pt will improve ability to make a fist with improved comfort, limited in finger flexion 50%    Baseline  75% limited due to surgery    Time  4    Period  Weeks    Status  New    Target Date  06/21/19        PT Long Term Goals - 05/24/19 1718      PT LONG TERM GOAL #1   Title  Pt will be I with more advanced HEP for L shoulder ROM and strength    Time  8    Period  Weeks    Status  New    Target Date  07/19/19      PT LONG TERM GOAL #2   Title  Pt willl be able to increase L UE strength in shoulder flexion and abduction to 4/5 for mobility tasks.    Baseline  2+/5 estimated    Time  8    Period  Weeks    Status  New    Target Date  07/19/19      PT LONG TERM GOAL #3   Title  Pt will be able to walk with walker and min shoulder pain short distances (300 feet)    Baseline  uses walker    Time  8    Period  Weeks    Status  New    Target Date  07/19/19      PT LONG TERM GOAL #4   Title  Pt will demonstrate full AROM in sitting in functional reach (ER and IR) with min discomfort    Baseline  passive ROM in ER 70 deg and IR 70 deg (did not measure functional reach due to protocol)    Period  Weeks    Status  New    Target Date  07/19/19      PT LONG TERM GOAL #5   Title  FOTO score TBA             Plan - 05/24/19 1724    Clinical Impression Statement  Patient presents for low complexity eval of L shoulder s/p rotator cuf repair, DCR and SAD on 05/07/19 as well as L index trigger finger release.  She presents with limitations in functional mobility due to inability to lift, push with her LUE.  She has pain, albeit improved.  Weakness is evident.  She has been using her sling as advised. She lives alone and will benefit from skilled PT to maximize her independence and safety in her home. Also include LE strength and gait as it realtes to her ability to walk and transfer.    Personal Factors and  Comorbidities  Comorbidity 3+;Past/Current Experience;Social Background;Finances;Behavior Pattern    Comorbidities  previous neck and back surgeries, morbid obesity, O2 dependent , lives alone    Examination-Activity Limitations  Bed Mobility;Reach Overhead;Locomotion Level;Transfers;Lift;Hygiene/Grooming;Toileting;Stand;Dressing;Stairs;Squat;Carry;Bend    Examination-Participation Restrictions  Church;Meal Prep;Cleaning;Community Activity;Driving;Interpersonal Relationship;Laundry;Shop    Stability/Clinical Decision Making  Stable/Uncomplicated    Clinical Decision Making  Low    Rehab Potential  Excellent    PT Frequency  2x / week    PT Duration  8 weeks    PT Treatment/Interventions  ADLs/Self Care Home Management;Patient/family education;Functional mobility training;Therapeutic activities;Moist Heat;Therapeutic exercise;Cryotherapy;Electrical Stimulation;Manual techniques;Neuromuscular re-education;Taping;Passive range of motion    PT Next  Visit Plan  FOTO and results, AAROM, isometrics, sit to stand    PT Home Exercise Plan  table slides, pendulums and scap squeeze    Consulted and Agree with Plan of Care  Patient       Patient will benefit from skilled therapeutic intervention in order to improve the following deficits and impairments:  Abnormal gait, Decreased range of motion, Difficulty walking, Obesity, Impaired UE functional use, Decreased endurance, Cardiopulmonary status limiting activity, Decreased activity tolerance, Pain, Impaired flexibility, Decreased balance, Decreased strength, Decreased mobility, Postural dysfunction  Visit Diagnosis: Muscle weakness (generalized)  Acute pain of left shoulder  S/P left rotator cuff repair  Other symptoms and signs involving the musculoskeletal system     Problem List Patient Active Problem List   Diagnosis Date Noted  . Constipation due to outlet dysfunction   . Impingement syndrome of left shoulder 05/07/2019  . Polyp of colon    . Polyp of ascending colon   . Rectal discomfort 02/12/2019  . Osteoarthritis of right hip 02/12/2019  . Nontraumatic tear of left supraspinatus tendon 12/30/2018  . Gout 12/17/2018  . Nontraumatic complete tear of right rotator cuff 12/08/2018  . Rotator cuff tear, right 12/01/2018  . Chest pain 09/24/2018  . Right groin pain 07/20/2018  . Depressed mood 07/13/2018  . Mouth pain 06/02/2018  . Paroxysmal SVT (supraventricular tachycardia) ([place]) 03/20/2018  . Pure hypercholesterolemia 03/20/2018  . Muscle cramping 02/25/2018  . Nodule of upper lobe of right lung 02/13/2018  . Falls, subsequent encounter 01/30/2018  . Pain in left foot 11/04/2017  . Open wound of left foot 11/04/2017  . Essential hypertension 10/27/2017  . Solitary pulmonary nodule 10/07/2017  . Restrictive lung disease secondary to obesity 10/01/2017  . Polycythemia, secondary 10/01/2017  . Chronic viral hepatitis B without delta-agent ([place]) 07/08/2017  . COPD (chronic obstructive pulmonary disease) with chronic bronchitis ([place]) 06/27/2017  . Chronic kidney disease (CKD), stage IV (severe) ([place]) 01/14/2017  . Type 2 diabetes mellitus with stage 3 chronic kidney disease, with long-term current use of insulin ([place]) 12/12/2016  . Urge incontinence of urine 12/05/2016  . Trigger finger, left index finger 07/15/2016  . Back pain 04/07/2014  . Morbid obesity ([place]) 10/02/2012  . Chronic diastolic CHF (congestive heart failure) ([place]) 04/07/2012  . GERD 01/26/2010  . Obstructive sleep apnea treated with BiPAP 02/03/2008  . FIBROCYSTIC BREAST DISEASE 10/28/2006  . Hyperlipidemia 03/27/2006  . Obesity hypoventilation syndrome ([place]) 03/27/2006  . Former tobacco use 03/27/2006  . Depression with anxiety 03/27/2006    [name] 05/24/2019, 6:50 PM  [facility] [address] [place], [place], [address] Phone: [phone]   Fax:  [phone]  Name: [name] MRN: [id number] Date of Birth: 10/26/1965   Raeford Razor, PT 05/24/19 6:50 PM Phone: [phone] Fax: [phone]

## 2019-05-24 NOTE — Patient Instructions (Signed)
Access Code: [email or web address]: 04/26/2021Prepared by: Anderson Malta PaaExercises  Seated Shoulder Flexion Towel Slide at Table Top - 3 x daily - 7 x weekly - 2-3 sets - 10 reps - 5-10 hold  Seated Shoulder Abduction Towel Slide at Table Top - 3 x daily - 7 x weekly - 2-3 sets - 10 reps - 5-10 hold  Seated Shoulder Pendulum Exercise - 3 x daily - 7 x weekly - 1 sets - 1 reps - 30 hold  Seated Scapular Retraction - 3 x daily - 7 x weekly - 2-3 sets - 10 reps - 5 hold

## 2019-05-25 ENCOUNTER — Encounter: Payer: Self-pay | Admitting: Orthopaedic Surgery

## 2019-05-25 ENCOUNTER — Ambulatory Visit (INDEPENDENT_AMBULATORY_CARE_PROVIDER_SITE_OTHER): Payer: Medicare Other | Admitting: Orthopaedic Surgery

## 2019-05-25 DIAGNOSIS — M75102 Unspecified rotator cuff tear or rupture of left shoulder, not specified as traumatic: Secondary | ICD-10-CM

## 2019-05-25 DIAGNOSIS — M25512 Pain in left shoulder: Secondary | ICD-10-CM | POA: Diagnosis not present

## 2019-05-25 DIAGNOSIS — G894 Chronic pain syndrome: Secondary | ICD-10-CM | POA: Diagnosis not present

## 2019-05-25 DIAGNOSIS — Z79899 Other long term (current) drug therapy: Secondary | ICD-10-CM | POA: Diagnosis not present

## 2019-05-25 DIAGNOSIS — M65322 Trigger finger, left index finger: Secondary | ICD-10-CM

## 2019-05-25 NOTE — Progress Notes (Signed)
Post-Op Visit Note   Patient: [name]           Date of Birth: 04-26-1965           MRN: [id number] Visit Date: 05/25/2019 PCP: [name], MD   Assessment & Plan:  Chief Complaint:  Chief Complaint  Patient presents with  . Left Shoulder - Pain, Follow-up, Routine Post Op  . Left Index Finger - Pain, Follow-up, Routine Post Op   Visit Diagnoses:  1. Trigger finger, left index finger   2. Nontraumatic tear of left supraspinatus tendon     Plan: [name] is 2 weeks status post left rotator cuff repair and left index trigger finger release.  She is here for suture removal.  She has started physical therapy for the shoulder.  Sutures removed today.  Mupirocin ointment and a Band-Aid once a day.  She will use index finger as tolerated.  Recheck in 4 weeks.  Follow-Up Instructions: Return in about 4 weeks (around 06/22/2019).   Orders:  No orders of the defined types were placed in this encounter.  No orders of the defined types were placed in this encounter.   Imaging: No results found.  PMFS History: Patient Active Problem List   Diagnosis Date Noted  . Constipation due to outlet dysfunction   . Impingement syndrome of left shoulder 05/07/2019  . Polyp of colon   . Polyp of ascending colon   . Rectal discomfort 02/12/2019  . Osteoarthritis of right hip 02/12/2019  . Nontraumatic tear of left supraspinatus tendon 12/30/2018  . Gout 12/17/2018  . Nontraumatic complete tear of right rotator cuff 12/08/2018  . Rotator cuff tear, right 12/01/2018  . Chest pain 09/24/2018  . Right groin pain 07/20/2018  . Depressed mood 07/13/2018  . Mouth pain 06/02/2018  . Paroxysmal SVT (supraventricular tachycardia) ([place]) 03/20/2018  . Pure hypercholesterolemia 03/20/2018  . Muscle cramping 02/25/2018  . Nodule of upper lobe of right lung 02/13/2018  . Falls, subsequent encounter 01/30/2018  . Pain in left foot 11/04/2017  . Open wound of left foot 11/04/2017  .  Essential hypertension 10/27/2017  . Solitary pulmonary nodule 10/07/2017  . Restrictive lung disease secondary to obesity 10/01/2017  . Polycythemia, secondary 10/01/2017  . Chronic viral hepatitis B without delta-agent ([place]) 07/08/2017  . COPD (chronic obstructive pulmonary disease) with chronic bronchitis ([place]) 06/27/2017  . Chronic kidney disease (CKD), stage IV (severe) ([place]) 01/14/2017  . Type 2 diabetes mellitus with stage 3 chronic kidney disease, with long-term current use of insulin ([place]) 12/12/2016  . Urge incontinence of urine 12/05/2016  . Trigger finger, left index finger 07/15/2016  . Back pain 04/07/2014  . Morbid obesity ([place]) 10/02/2012  . Chronic diastolic CHF (congestive heart failure) ([place]) 04/07/2012  . GERD 01/26/2010  . Obstructive sleep apnea treated with BiPAP 02/03/2008  . FIBROCYSTIC BREAST DISEASE 10/28/2006  . Hyperlipidemia 03/27/2006  . Obesity hypoventilation syndrome ([place]) 03/27/2006  . Former tobacco use 03/27/2006  . Depression with anxiety 03/27/2006   Past Medical History:  Diagnosis Date  . Alcoholism ([place])   . Anxiety   . Arthritis 04-10-11   hips, shoulders, back  . Asthma   . Bipolar disorder ([place])   . Cardiomegaly   . Cervical cancer ([place]) 1993   cervical, no treatment done, went away per pt  . Cervical dysplasia or atypia 04-10-11   '93- once dx.-got pregnant-no intervention, then postpartum, no dysplasia found  . CHF (congestive heart failure) ([place])  no cardiologist 2014 dx, none now  . Chronic hypoxemic respiratory failure ([place])   . Chronic kidney disease    stage 3 kidney disease  . 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 Pathology showed Condyloma, gluteal CONDYLOMA ACUMINATUM.   [name] Kitchen COPD (chronic obstructive pulmonary disease) ([place])   . Depression   . Diabetes mellitus without complication ([place])   . Dyspnea    with actity, sitting  . Fall 12/16/2016  . Fibrocystic breast disease   . GERD  (gastroesophageal reflux disease)   . Grade I diastolic dysfunction 64/68/0321   Noted on ECHO  . Hepatitis    hep B-count is low at present,doesn't register  . History of PSVT (paroxysmal supraventricular tachycardia)   . Hypercalcemia   . Hyperlipidemia   . Hypertension   . Incomplete left bundle branch block (LBBB) 09/24/2018   Noted on EKG  . Lung nodule 11/2017   right and left lung  . Migraine headache    none recent  . Morbid obesity ([place]) 03/27/2006  . Neuropathy   . Pneumonia    walking pneu 15 yrs. ago  . Skin lesion 03/15/2011   In gluteal crease now s/p removal by Dr. [name] of General Surgery on 3/12. Path shows condyloma.     . Sleep apnea 04-10-11   uses cpap, pt does not know settings  . Trigger finger    left third  . Urge incontinence of urine     Family History  Problem Relation Age of Onset  . Pulmonary embolism Mother   . Stroke Father   . Alcohol abuse Father   . Pneumonia Father   . Hypertension Father   . Breast cancer Maternal Aunt   . Hypertension Other   . Diabetes Other        Aunts and cousins  . Asthma Sister   . Depression Sister   . Arthritis Sister   . Sickle cell anemia Son     Past Surgical History:  Procedure Laterality Date  . ANAL FISTULECTOMY  04/17/2011   Procedure: FISTULECTOMY ANAL;  Surgeon: Imogene Burn. [name], MD;  [facility];  Service: General;  Laterality: N/A;  Excision of Condyloma Gluteal Cleft   . ANAL RECTAL MANOMETRY N/A 03/26/2019   Procedure: ANO RECTAL MANOMETRY;  Surgeon: Thornton Park, MD;  [facility];  Service: Gastroenterology;  Laterality: N/A;  . BIOPSY  03/09/2019   Procedure: BIOPSY;  Surgeon: Thornton Park, MD;  [facility];  Service: Gastroenterology;;  . Lum [place]as INJECTION N/A 12/29/2018   Procedure: BOTOX INJECTION(100 UNITS)  WITH CYSTOSCOPY;  Surgeon: Ceasar Mons, MD;  [facility];  Service: Urology;  Laterality: N/A;  . BREAST CYST EXCISION      bilateral breast, 3 cysts removed from each breast  . BREAST EXCISIONAL BIOPSY Right    x 3  . BREAST EXCISIONAL BIOPSY Left    x 3  . CARPAL TUNNEL RELEASE Bilateral   . Cervical biospy    . CERVICAL FUSION  04-10-11   x3- cervical fusion with plating and screws-Dr. [name]  . COLONOSCOPY WITH PROPOFOL N/A 07/06/2015   Procedure: COLONOSCOPY WITH PROPOFOL;  Surgeon: [name], MD;  [facility];  Service: Endoscopy;  Laterality: N/A;  . COLONOSCOPY WITH PROPOFOL N/A 03/09/2019   Procedure: COLONOSCOPY WITH PROPOFOL;  Surgeon: Thornton Park, MD;  [facility];  Service: Gastroenterology;  Laterality: N/A;  . DOPPLER ECHOCARDIOGRAPHY  04/06/2012   AT [facility] 55-60%  .  FOOT SURGERY Left 2018  . FRACTURE SURGERY     little toe left foot  . I & D EXTREMITY Left 12/18/2016   Procedure: IRRIGATION AND DEBRIDEMENT LEFT FOOT, CLOSURE;  Surgeon: [name], MD;  Location: [facility];  Service: Orthopedics;  Laterality: Left;  . [place] SURGERY  04-10-11   x5-Lumbar fusion-retained hardware.(Dr. Louanne Skye)  . MULTIPLE TOOTH EXTRACTIONS    . POLYPECTOMY  03/09/2019   Procedure: POLYPECTOMY;  Surgeon: Thornton Park, MD;  [facility];  Service: Gastroenterology;;  . SHOULDER ARTHROSCOPY WITH ROTATOR CUFF REPAIR AND SUBACROMIAL DECOMPRESSION Left 05/07/2019   Procedure: LEFT SHOULDER ARTHROSCOPY WITH DEBRIDEMENT, DISTAL CLAVICLE EXCISION,  SUBACROMIAL DECOMPRESSION AND POSSIBLE ROTATOR CUFF REPAIR;  Surgeon: [name], MD;  Location: [facility];  Service: Orthopedics;  Laterality: Left;  . TEE WITHOUT CARDIOVERSION N/A 04/06/2012   Procedure: TRANSESOPHAGEAL ECHOCARDIOGRAM (TEE);  Surgeon: [name], MD;  [facility];  Service: Cardiovascular;  Laterality: N/A;  . TRIGGER FINGER RELEASE Right    middle finger  . TRIGGER FINGER RELEASE Left 06/28/2016   Procedure: RELEASE TRIGGER FINGER LEFT 3RD FINGER;  Surgeon: [name], MD;  Location: [facility];   Service: Orthopedics;  Laterality: Left;  . TRIGGER FINGER RELEASE Right 06/12/2016   Procedure: RIGHT INDEX FINGER TRIGGER RELEASE;  Surgeon: [name], MD;  Location: [facility];  Service: Orthopedics;  Laterality: Right;  . TRIGGER FINGER RELEASE Right 01/19/2018   Procedure: RIGHT RING FINGER TRIGGER FINGER RELEASE;  Surgeon: [name], MD;  Location: [facility];  Service: Orthopedics;  Laterality: Right;  . TRIGGER FINGER RELEASE Left 05/07/2019   Procedure: RELEASE TRIGGER FINGER LEFT INDEX FINGER;  Surgeon: [name], MD;  Location: [facility];  Service: Orthopedics;  Laterality: Left;   Social History   Occupational History  . Occupation: disabled    Comment: Disabled  Tobacco Use  . Smoking status: Current Every Day Smoker    Packs/day: 0.20    Years: 40.00    Pack years: 8.00    Types: Cigarettes    Start date: 01/28/1977  . Smokeless tobacco: Never Used  . Tobacco comment: 4-5 cigarettes per day  Substance and Sexual Activity  . Alcohol use: No    Alcohol/week: 0.0 standard drinks  . Drug use: Not Currently    Types: Marijuana, "Crack" cocaine    Comment: hx marijuana usemany years ago, Crack -none since 11/2015  . Sexual activity: Not Currently    Partners: Male    Birth control/protection: Post-menopausal

## 2019-05-26 DIAGNOSIS — H524 Presbyopia: Secondary | ICD-10-CM | POA: Diagnosis not present

## 2019-05-28 ENCOUNTER — Other Ambulatory Visit: Payer: Self-pay | Admitting: Family Medicine

## 2019-05-31 ENCOUNTER — Other Ambulatory Visit: Payer: Self-pay | Admitting: Podiatry

## 2019-05-31 DIAGNOSIS — B353 Tinea pedis: Secondary | ICD-10-CM

## 2019-05-31 DIAGNOSIS — E1151 Type 2 diabetes mellitus with diabetic peripheral angiopathy without gangrene: Secondary | ICD-10-CM

## 2019-06-01 ENCOUNTER — Encounter: Payer: Self-pay | Admitting: Family Medicine

## 2019-06-01 ENCOUNTER — Ambulatory Visit: Payer: Medicare Other | Admitting: Physical Therapy

## 2019-06-01 NOTE — Telephone Encounter (Signed)
Refill this order?

## 2019-06-01 NOTE — Telephone Encounter (Signed)
Refill

## 2019-06-02 ENCOUNTER — Telehealth: Payer: Self-pay | Admitting: *Deleted

## 2019-06-02 ENCOUNTER — Other Ambulatory Visit: Payer: Self-pay | Admitting: Podiatry

## 2019-06-02 DIAGNOSIS — E1151 Type 2 diabetes mellitus with diabetic peripheral angiopathy without gangrene: Secondary | ICD-10-CM

## 2019-06-02 DIAGNOSIS — B353 Tinea pedis: Secondary | ICD-10-CM

## 2019-06-02 MED ORDER — KETOCONAZOLE 2 % EX CREA: 1.0000 "application " | TOPICAL_CREAM | Freq: Two times a day (BID) | CUTANEOUS | 0 refills | Status: DC

## 2019-06-04 ENCOUNTER — Other Ambulatory Visit: Payer: Self-pay

## 2019-06-04 ENCOUNTER — Ambulatory Visit: Payer: Medicare Other | Attending: Orthopaedic Surgery | Admitting: Physical Therapy

## 2019-06-04 DIAGNOSIS — G4733 Obstructive sleep apnea (adult) (pediatric): Secondary | ICD-10-CM | POA: Diagnosis not present

## 2019-06-04 DIAGNOSIS — M6281 Muscle weakness (generalized): Secondary | ICD-10-CM | POA: Insufficient documentation

## 2019-06-04 DIAGNOSIS — R29898 Other symptoms and signs involving the musculoskeletal system: Secondary | ICD-10-CM | POA: Insufficient documentation

## 2019-06-04 DIAGNOSIS — Z9889 Other specified postprocedural states: Secondary | ICD-10-CM | POA: Diagnosis not present

## 2019-06-04 DIAGNOSIS — M25512 Pain in left shoulder: Secondary | ICD-10-CM | POA: Diagnosis not present

## 2019-06-04 DIAGNOSIS — M5090 Cervical disc disorder, unspecified, unspecified cervical region: Secondary | ICD-10-CM | POA: Diagnosis not present

## 2019-06-04 DIAGNOSIS — J9621 Acute and chronic respiratory failure with hypoxia: Secondary | ICD-10-CM | POA: Diagnosis not present

## 2019-06-04 NOTE — Patient Instructions (Signed)
Access Code: [email or web address] URL: [email or web address] Date: 06/04/2019 Prepared by: Hessie Diener  Exercises Isometric Shoulder Flexion at Wall - 1 x daily - 7 x weekly - 10 reps - 3 sets Isometric Shoulder Extension at Wall - 1 x daily - 7 x weekly - 10 reps - 3 sets Standing Isometric Shoulder Internal Rotation with Towel Roll at Doorway - 1 x daily - 7 x weekly - 10 reps - 3 sets Standing Isometric Shoulder External Rotation with Doorway - 1 x daily - 7 x weekly - 10 reps - 3 sets Supine Shoulder Flexion Extension AAROM with [place] - 1 x daily - 7 x weekly - 10 reps - 3 sets

## 2019-06-04 NOTE — Therapy (Signed)
[place], [place], [address] Phone: [phone]   Fax:  [phone]  Physical Therapy Treatment  Patient Details  Name: [name] MRN: [id number] Date of Birth: July 23, 1965 Referring Provider (PT): Dr. Erlinda Hong    Encounter Date: 06/04/2019  PT End of Session - 06/04/19 1107    Visit Number  2    Number of Visits  16    Date for PT Re-Evaluation  07/19/19    Authorization Type  [employer] MCR and MCD    PT Start Time  1100    PT Stop Time  1140    PT Time Calculation (min)  40 min       Past Medical History:  Diagnosis Date  . Alcoholism ([place])   . Anxiety   . Arthritis 04-10-11   hips, shoulders, back  . Asthma   . Bipolar disorder ([place])   . Cardiomegaly   . Cervical cancer ([place]) 1993   cervical, no treatment done, went away per pt  . Cervical dysplasia or atypia 04-10-11   '93- once dx.-got pregnant-no intervention, then postpartum, no dysplasia found  . CHF (congestive heart failure) ([place])    no cardiologist 2014 dx, none now  . Chronic hypoxemic respiratory failure ([place])   . Chronic kidney disease    stage 3 kidney disease  . 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 Pathology showed Condyloma, gluteal CONDYLOMA ACUMINATUM.   [name] Kitchen COPD (chronic obstructive pulmonary disease) ([place])   . Depression   . Diabetes mellitus without complication ([place])   . Dyspnea    with actity, sitting  . Fall 12/16/2016  . Fibrocystic breast disease   . GERD (gastroesophageal reflux disease)   . Grade I diastolic dysfunction 25/36/6440   Noted on ECHO  . Hepatitis    hep B-count is low at present,doesn't register  . History of PSVT (paroxysmal supraventricular tachycardia)   . Hypercalcemia   . Hyperlipidemia   . Hypertension   . Incomplete left bundle branch block (LBBB) 09/24/2018   Noted on EKG  . Lung nodule 11/2017   right and left lung  . Migraine headache    none recent  . Morbid  obesity ([place]) 03/27/2006  . Neuropathy   . Pneumonia    walking pneu 15 yrs. ago  . Skin lesion 03/15/2011   In gluteal crease now s/p removal by Dr. [name] of General Surgery on 3/12. Path shows condyloma.     . Sleep apnea 04-10-11   uses cpap, pt does not know settings  . Trigger finger    left third  . Urge incontinence of urine     Past Surgical History:  Procedure Laterality Date  . ANAL FISTULECTOMY  04/17/2011   Procedure: FISTULECTOMY ANAL;  Surgeon: Imogene Burn. [name], MD;  [facility];  Service: General;  Laterality: N/A;  Excision of Condyloma Gluteal Cleft   . ANAL RECTAL MANOMETRY N/A 03/26/2019   Procedure: ANO RECTAL MANOMETRY;  Surgeon: Thornton Park, MD;  [facility];  Service: Gastroenterology;  Laterality: N/A;  . BIOPSY  03/09/2019   Procedure: BIOPSY;  Surgeon: Thornton Park, MD;  [facility];  Service: Gastroenterology;;  . Lum Keas INJECTION N/A 12/29/2018   Procedure: BOTOX INJECTION(100 UNITS)  WITH CYSTOSCOPY;  Surgeon: Ceasar Mons, MD;  [facility];  Service: Urology;  Laterality: N/A;  . BREAST CYST EXCISION     bilateral breast, 3 cysts removed from each breast  .  BREAST EXCISIONAL BIOPSY Right    x 3  . BREAST EXCISIONAL BIOPSY Left    x 3  . CARPAL TUNNEL RELEASE Bilateral   . Cervical biospy    . CERVICAL FUSION  04-10-11   x3- cervical fusion with plating and screws-Dr. [name]  . COLONOSCOPY WITH PROPOFOL N/A 07/06/2015   Procedure: COLONOSCOPY WITH PROPOFOL;  Surgeon: [name], MD;  [facility];  Service: Endoscopy;  Laterality: N/A;  . COLONOSCOPY WITH PROPOFOL N/A 03/09/2019   Procedure: COLONOSCOPY WITH PROPOFOL;  Surgeon: Thornton Park, MD;  [facility];  Service: Gastroenterology;  Laterality: N/A;  . DOPPLER ECHOCARDIOGRAPHY  04/06/2012   AT [place] 55-60%  . FOOT SURGERY Left 2018  . FRACTURE SURGERY     little toe left foot  . I & D EXTREMITY Left 12/18/2016    Procedure: IRRIGATION AND DEBRIDEMENT LEFT FOOT, CLOSURE;  Surgeon: [name], MD;  Location: [facility];  Service: Orthopedics;  Laterality: Left;  . [place] SURGERY  04-10-11   x5-Lumbar fusion-retained hardware.(Dr. Louanne Skye)  . MULTIPLE TOOTH EXTRACTIONS    . POLYPECTOMY  03/09/2019   Procedure: POLYPECTOMY;  Surgeon: Thornton Park, MD;  [facility];  Service: Gastroenterology;;  . SHOULDER ARTHROSCOPY WITH ROTATOR CUFF REPAIR AND SUBACROMIAL DECOMPRESSION Left 05/07/2019   Procedure: LEFT SHOULDER ARTHROSCOPY WITH DEBRIDEMENT, DISTAL CLAVICLE EXCISION,  SUBACROMIAL DECOMPRESSION AND POSSIBLE ROTATOR CUFF REPAIR;  Surgeon: [name], MD;  Location: [place];  Service: Orthopedics;  Laterality: Left;  . TEE WITHOUT CARDIOVERSION N/A 04/06/2012   Procedure: TRANSESOPHAGEAL ECHOCARDIOGRAM (TEE);  Surgeon: [name], MD;  Location: [facility];  Service: Cardiovascular;  Laterality: N/A;  . TRIGGER FINGER RELEASE Right    middle finger  . TRIGGER FINGER RELEASE Left 06/28/2016   Procedure: RELEASE TRIGGER FINGER LEFT 3RD FINGER;  Surgeon: [name], MD;  Location: [facility];  Service: Orthopedics;  Laterality: Left;  . TRIGGER FINGER RELEASE Right 06/12/2016   Procedure: RIGHT INDEX FINGER TRIGGER RELEASE;  Surgeon: [name], MD;  Location: [facility];  Service: Orthopedics;  Laterality: Right;  . TRIGGER FINGER RELEASE Right 01/19/2018   Procedure: RIGHT RING FINGER TRIGGER FINGER RELEASE;  Surgeon: [name], MD;  Location: [facility];  Service: Orthopedics;  Laterality: Right;  . TRIGGER FINGER RELEASE Left 05/07/2019   Procedure: RELEASE TRIGGER FINGER LEFT INDEX FINGER;  Surgeon: [name], MD;  Location: [place];  Service: Orthopedics;  Laterality: Left;    There were no vitals filed for this visit.  Subjective Assessment - 06/04/19 1124    Subjective  No pain at rest.    Currently in Pain?  No/denies         [facility] PT Assessment - 06/04/19 0001       Observation/Other Assessments   Focus on Therapeutic Outcomes (FOTO)   wrong set up/NT                   [facility] Adult PT Treatment/Exercise - 06/04/19 0001      Shoulder Exercises: Pulleys   Flexion  2 minutes      Shoulder Exercises: ROM/Strengthening   Other ROM/Strengthening Exercises  supine cane pressups and pullovers     Other ROM/Strengthening Exercises  scapular retraction x 10       Shoulder Exercises: Isometric Strengthening   Other Isometric Exercises  5 sec x 10 4 way -manual resist- will use dining room table at home/ back of chair  Manual Therapy   Manual therapy comments  passive shoulder 4 way              PT Education - 06/04/19 1142    Education Details  HEP    Person(s) Educated  Patient    Methods  Explanation;Handout    Comprehension  Verbalized understanding       PT Short Term Goals - 05/24/19 1712      PT SHORT TERM GOAL #1   Title  Pt will be I with HEP for strength and ROM within limits of surgical protocol    Baseline  given AAROM on eval    Time  4    Period  Weeks    Status  New    Target Date  06/21/19      PT SHORT TERM GOAL #2   Title  Pt will be able to improve transfers in the clinic with mod I (stand-pivot and bed mobility)    Baseline  min A to supervision    Time  4    Period  Weeks    Status  New    Target Date  06/21/19      PT SHORT TERM GOAL #3   Title  Pt will be able to use L UE to assist with ADLs below shoulder height with min increase in pain    Baseline  PROM at this time, 3 weeks post op.    Time  4    Period  Weeks    Status  New    Target Date  06/21/19      PT SHORT TERM GOAL #4   Title  Pt will improve ability to make a fist with improved comfort, limited in finger flexion 50%    Baseline  75% limited due to surgery    Time  4    Period  Weeks    Status  New    Target Date  06/21/19        PT Long Term Goals - 05/24/19 1718      PT LONG TERM GOAL #1   Title  Pt will be I with  more advanced HEP for L shoulder ROM and strength    Time  8    Period  Weeks    Status  New    Target Date  07/19/19      PT LONG TERM GOAL #2   Title  Pt willl be able to increase L UE strength in shoulder flexion and abduction to 4/5 for mobility tasks.    Baseline  2+/5 estimated    Time  8    Period  Weeks    Status  New    Target Date  07/19/19      PT LONG TERM GOAL #3   Title  Pt will be able to walk with walker and min shoulder pain short distances (300 feet)    Baseline  uses walker    Time  8    Period  Weeks    Status  New    Target Date  07/19/19      PT LONG TERM GOAL #4   Title  Pt will demonstrate full AROM in sitting in functional reach (ER and IR) with min discomfort    Baseline  passive ROM in ER 70 deg and IR 70 deg (did not measure functional reach due to protocol)    Period  Weeks    Status  New    Target Date  07/19/19      PT LONG TERM GOAL #5   Title  FOTO score TBA            Plan - 06/04/19 1152    Clinical Impression Statement  Pt reports no pain at rest. She is able advance with isometrics and supine cane without much increased pain. Updated HEP. PROM WFL.    PT Next Visit Plan  AAROM, isometrics, sit to stand    PT Home Exercise Plan  table slides, pendulums and scap squeeze, added isometrics and supine cane pressups and pullovers       Patient will benefit from skilled therapeutic intervention in order to improve the following deficits and impairments:  Abnormal gait, Decreased range of motion, Difficulty walking, Obesity, Impaired UE functional use, Decreased endurance, Cardiopulmonary status limiting activity, Decreased activity tolerance, Pain, Impaired flexibility, Decreased balance, Decreased strength, Decreased mobility, Postural dysfunction  Visit Diagnosis: Muscle weakness (generalized)  Acute pain of left shoulder  S/P left rotator cuff repair     Problem List Patient Active Problem List   Diagnosis Date Noted  .  Constipation due to outlet dysfunction   . Impingement syndrome of left shoulder 05/07/2019  . Polyp of colon   . Polyp of ascending colon   . Rectal discomfort 02/12/2019  . Osteoarthritis of right hip 02/12/2019  . Nontraumatic tear of left supraspinatus tendon 12/30/2018  . Gout 12/17/2018  . Nontraumatic complete tear of right rotator cuff 12/08/2018  . Rotator cuff tear, right 12/01/2018  . Chest pain 09/24/2018  . Right groin pain 07/20/2018  . Depressed mood 07/13/2018  . Mouth pain 06/02/2018  . Paroxysmal SVT (supraventricular tachycardia) ([place]) 03/20/2018  . Pure hypercholesterolemia 03/20/2018  . Muscle cramping 02/25/2018  . Nodule of upper lobe of right lung 02/13/2018  . Falls, subsequent encounter 01/30/2018  . Pain in left foot 11/04/2017  . Open wound of left foot 11/04/2017  . Essential hypertension 10/27/2017  . Solitary pulmonary nodule 10/07/2017  . Restrictive lung disease secondary to obesity 10/01/2017  . Polycythemia, secondary 10/01/2017  . Chronic viral hepatitis B without delta-agent ([place]) 07/08/2017  . COPD (chronic obstructive pulmonary disease) with chronic bronchitis ([place]) 06/27/2017  . Chronic kidney disease (CKD), stage IV (severe) ([place]) 01/14/2017  . Type 2 diabetes mellitus with stage 3 chronic kidney disease, with long-term current use of insulin ([place]) 12/12/2016  . Urge incontinence of urine 12/05/2016  . Trigger finger, left index finger 07/15/2016  . Back pain 04/07/2014  . Morbid obesity ([place]) 10/02/2012  . Chronic diastolic CHF (congestive heart failure) ([place]) 04/07/2012  . GERD 01/26/2010  . Obstructive sleep apnea treated with BiPAP 02/03/2008  . FIBROCYSTIC BREAST DISEASE 10/28/2006  . Hyperlipidemia 03/27/2006  . Obesity hypoventilation syndrome ([place]) 03/27/2006  . Former tobacco use 03/27/2006  . Depression with anxiety 03/27/2006    Dorene Ar, PTA 06/04/2019, 12:22 PM  [facility] [address] [place], [place], [address] Phone: [phone]   Fax:  [phone]  Name: [name] MRN: [id number] Date of Birth: 02/11/1965

## 2019-06-08 ENCOUNTER — Ambulatory Visit: Payer: Medicare Other | Admitting: Physical Therapy

## 2019-06-09 ENCOUNTER — Ambulatory Visit: Payer: Medicare Other | Admitting: Pulmonary Disease

## 2019-06-09 NOTE — Progress Notes (Deleted)
Synopsis: Referred in November 2019 for COPD by [name], MD  Subjective:   PATIENT ID: [name] GENDER: female DOB: Nov 30, 1965, MRN: [id number]  No chief complaint on file.   PMH of OSA, OHS, on BiPAP, Chronic respiratory failure on 2L day and night. Long time smoker past 40 years 1.5ppd. Recent CT scan with RUL and LUL lung nodule. She has trouble walking and talking at the same time. Currently just managed with albuterol inhaler. Also on trelegy inhaler. Planning to go on the 2nd for evaluation of lap-band surgery. Its a 6 month program that she is enrolling in. She does have allergies to seasons, outdoor and pollen.  Overall the patient has had significant shortness of breath with exertion or minimal activity.  She does understand that her weight plays a large role in this.  She has been trying to watch what she is eating.  She has been successful at quitting smoking within the past year.  She has a sleep study scheduled for evaluation of her OSA and getting a new machine and new mask.  She has daily cough, denies sputum production, denies recent exacerbation.  She has been told that she has COPD but has not had full PFTs before.  She does have chronic diastolic heart failure that is managed with diuretics.  She watches her fluid intake.  She has noticed some edema in her lower extremities.  OV 02/13/2018: She has been doing really well. She started a new fluid pill and feels like it has been better results. She has lost several pounds proximately 20 since she was last seen with [place].  She also obtained a new BiPAP machine for treatment of her obstructive sleep apnea.  She really likes the new machine and has been sleeping so much better.  She has been using a new humidifier which has made it tolerable.  She has been checking her oxygen on a regular basis.  Most of the time with her new finger O2 sat probe she is in the mid 90s.  And has not needed oxygen at rest and even with exertion  in most places.  She does keep it with her just in case.  She is still using oxygen at nighttime with her Pap therapy.  At this point her respiratory complaints are approximately the same.  She does have a follow-up CT scan for March 2020 for bilateral lung nodules and PFTs that have been ordered but not yet completed.  Office spirometry last time revealed reduced spirometry no evidence of obstruction.  OV 08/18/2018: Seen to day in follow up and having PFTS completed today in the offices. She recently describes and episode of getting a steroid injection her had for trigger finger. She was stressed out because having her glucose elevated. She unfortunately started smoking again. She has started her chantix again. She is going to stop again. She stopped for 8 months before restarted.  Pulmonary function test completed today which revealed an FVC of 2.29 L, 65% predicted, FEV1, 1.58 L, 57% predicted, ERV 46%, DLCO 49% predicted.  Patient has a BMI of 55.67.  She states that she cannot go out during the heat of the day.  She tries to avoid leaving her home between noon and 6 PM.  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 Unfortunately she is still smoking. She states to [facility] that  she has reduced to 4 cigarettes per day but admits she smokes more bad days. She is BIPAP machine at night and follows with a sleep clinic. She has been seen by ortho and has should pain. And is planning possibly having shoulder sugeries for repair and trigger fingers. Currently holding due to elective surgeries. Also has planned c-scope by GI.  Patient still has dyspnea on exertion throughout the house.  She lives alone.  OV 06/09/2019: Here today for follow-up regarding smoking cessation also morbidly obese with mixed obstructive and restrictive lung disease. ***  Past Medical History:    Diagnosis Date  . Alcoholism ([place])   . Anxiety   . Arthritis 04-10-11   hips, shoulders, back  . Asthma   . Bipolar disorder ([place])   . Cardiomegaly   . Cervical cancer ([place]) 1993   cervical, no treatment done, went away per pt  . Cervical dysplasia or atypia 04-10-11   '93- once dx.-got pregnant-no intervention, then postpartum, no dysplasia found  . CHF (congestive heart failure) ([place])    no cardiologist 2014 dx, none now  . Chronic hypoxemic respiratory failure ([place])   . Chronic kidney disease    stage 3 kidney disease  . 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 Pathology showed Condyloma, gluteal CONDYLOMA ACUMINATUM.   [name] Kitchen COPD (chronic obstructive pulmonary disease) ([place])   . Depression   . Diabetes mellitus without complication ([place])   . Dyspnea    with actity, sitting  . Fall 12/16/2016  . Fibrocystic breast disease   . GERD (gastroesophageal reflux disease)   . Grade I diastolic dysfunction 79/89/2119   Noted on ECHO  . Hepatitis    hep B-count is low at present,doesn't register  . History of PSVT (paroxysmal supraventricular tachycardia)   . Hypercalcemia   . Hyperlipidemia   . Hypertension   . Incomplete left bundle branch block (LBBB) 09/24/2018   Noted on EKG  . Lung nodule 11/2017   right and left lung  . Migraine headache    none recent  . Morbid obesity ([place]) 03/27/2006  . Neuropathy   . Pneumonia    walking pneu 15 yrs. ago  . Skin lesion 03/15/2011   In gluteal crease now s/p removal by Dr. [name] of General Surgery on 3/12. Path shows condyloma.     . Sleep apnea 04-10-11   uses cpap, pt does not know settings  . Trigger finger    left third  . Urge incontinence of urine      Family History  Problem Relation Age of Onset  . Pulmonary embolism Mother   . Stroke Father   . Alcohol abuse Father   . Pneumonia Father   . Hypertension Father   . Breast cancer Maternal Aunt   . Hypertension Other   . Diabetes Other         Aunts and cousins  . Asthma Sister   . Depression Sister   . Arthritis Sister   . Sickle cell anemia Son      Past Surgical History:  Procedure Laterality Date  . ANAL FISTULECTOMY  04/17/2011   Procedure: FISTULECTOMY ANAL;  Surgeon: Imogene Burn. [name], MD;  Location: [facility];  Service: General;  Laterality: N/A;  Excision of Condyloma Gluteal Cleft   . ANAL RECTAL MANOMETRY N/A 03/26/2019   Procedure: ANO RECTAL MANOMETRY;  Surgeon: Thornton Park, MD;  Location: [facility];  Service: Gastroenterology;  Laterality: N/A;  . BIOPSY  03/09/2019  Procedure: BIOPSY;  Surgeon: Thornton Park, MD;  Location: [facility];  Service: Gastroenterology;;  . Lum [place] INJECTION N/A 12/29/2018   Procedure: BOTOX INJECTION(100 UNITS)  WITH CYSTOSCOPY;  Surgeon: Ceasar Mons, MD;  Location: [facility];  Service: Urology;  Laterality: N/A;  . BREAST CYST EXCISION     bilateral breast, 3 cysts removed from each breast  . BREAST EXCISIONAL BIOPSY Right    x 3  . BREAST EXCISIONAL BIOPSY Left    x 3  . CARPAL TUNNEL RELEASE Bilateral   . Cervical biospy    . CERVICAL FUSION  04-10-11   x3- cervical fusion with plating and screws-Dr. [name]  . COLONOSCOPY WITH PROPOFOL N/A 07/06/2015   Procedure: COLONOSCOPY WITH PROPOFOL;  Surgeon: [name], MD;  [facility];  Service: Endoscopy;  Laterality: N/A;  . COLONOSCOPY WITH PROPOFOL N/A 03/09/2019   Procedure: COLONOSCOPY WITH PROPOFOL;  Surgeon: Thornton Park, MD;  [facility];  Service: Gastroenterology;  Laterality: N/A;  . DOPPLER ECHOCARDIOGRAPHY  04/06/2012   AT [place] 55-60%  . FOOT SURGERY Left 2018  . FRACTURE SURGERY     little toe left foot  . I & D EXTREMITY Left 12/18/2016   Procedure: IRRIGATION AND DEBRIDEMENT LEFT FOOT, CLOSURE;  Surgeon: [name], MD;  Location: [place];  Service: Orthopedics;  Laterality: Left;  . [place] SURGERY  04-10-11   x5-Lumbar fusion-retained hardware.(Dr. Louanne Skye)    . MULTIPLE TOOTH EXTRACTIONS    . POLYPECTOMY  03/09/2019   Procedure: POLYPECTOMY;  Surgeon: Thornton Park, MD;  [facility];  Service: Gastroenterology;;  . SHOULDER ARTHROSCOPY WITH ROTATOR CUFF REPAIR AND SUBACROMIAL DECOMPRESSION Left 05/07/2019   Procedure: LEFT SHOULDER ARTHROSCOPY WITH DEBRIDEMENT, DISTAL CLAVICLE EXCISION,  SUBACROMIAL DECOMPRESSION AND POSSIBLE ROTATOR CUFF REPAIR;  Surgeon: [name], MD;  Location: [place];  Service: Orthopedics;  Laterality: Left;  . TEE WITHOUT CARDIOVERSION N/A 04/06/2012   Procedure: TRANSESOPHAGEAL ECHOCARDIOGRAM (TEE);  Surgeon: [name], MD;  Location: [facility];  Service: Cardiovascular;  Laterality: N/A;  . TRIGGER FINGER RELEASE Right    middle finger  . TRIGGER FINGER RELEASE Left 06/28/2016   Procedure: RELEASE TRIGGER FINGER LEFT 3RD FINGER;  Surgeon: [name], MD;  Location: [place];  Service: Orthopedics;  Laterality: Left;  . TRIGGER FINGER RELEASE Right 06/12/2016   Procedure: RIGHT INDEX FINGER TRIGGER RELEASE;  Surgeon: [name], MD;  Location: [place];  Service: Orthopedics;  Laterality: Right;  . TRIGGER FINGER RELEASE Right 01/19/2018   Procedure: RIGHT RING FINGER TRIGGER FINGER RELEASE;  Surgeon: [name], MD;  Location: [place];  Service: Orthopedics;  Laterality: Right;  . TRIGGER FINGER RELEASE Left 05/07/2019   Procedure: RELEASE TRIGGER FINGER LEFT INDEX FINGER;  Surgeon: [name], MD;  Location: [place];  Service: Orthopedics;  Laterality: Left;    Social History   Socioeconomic History  . Marital status: Single    Spouse name: Not on file  . Number of children: 1  . Years of education: 68  . Highest education level: 12th grade  Occupational History  . Occupation: disabled    Comment: Disabled  Tobacco Use  . Smoking status: Current Every Day Smoker    Packs/day: 0.20    Years: 40.00    Pack years: 8.00    Types: Cigarettes    Start date: 01/28/1977  . Smokeless tobacco:  Never Used  . Tobacco comment: 4-5 cigarettes per day  Substance and  Sexual Activity  . Alcohol use: No    Alcohol/week: 0.0 standard drinks  . Drug use: Not Currently    Types: Marijuana, "Crack" cocaine    Comment: hx marijuana usemany years ago, Crack -none since 11/2015  . Sexual activity: Not Currently    Partners: Male    Birth control/protection: Post-menopausal  Other Topics Concern  . Not on file  Social History Narrative   Current Social History       Who lives at home: Patient lives alone in one level home; has 4 steps onto porch with a handrail. Has smoke alarms, no throw rugs. Has elevated toilet. Has shower bench in tub with grab rails.    Transportation: Patient has own vehicle and drives herself    Important Relationships "Family and friends"    Pets: None    Likes to eat varied diet. Seafood, fish, roasted [place] breast, vegetables and salads and fruits.   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 I'm a lovable person."                                                                                                   Social Determinants of Health   Financial Resource Strain:   . Difficulty of Paying Living Expenses:   Food Insecurity:   . Worried About Charity fundraiser in the Last Year:   . Arboriculturist in the Last Year:   Transportation Needs:   . Film/video editor (Medical):   [name] Kitchen Lack of Transportation (Non-Medical):   Physical Activity:   . Days of Exercise per Week:   . Minutes of Exercise per Session:   Stress:   . Feeling of Stress :   Social Connections:   . Frequency of Communication with Friends and Family:   . Frequency of Social Gatherings with Friends and Family:   . Attends  Religious Services:   . Active Member of Clubs or Organizations:   . Attends Archivist Meetings:   [name] Kitchen Marital Status:   Intimate Partner Violence:   . Fear of Current or Ex-Partner:   . Emotionally Abused:   [name] Kitchen Physically Abused:   . Sexually Abused:      Allergies  Allergen Reactions  . Aspirin Other (See Comments)    Kidney disease  . Chantix [Varenicline Tartrate] Other (See Comments)    On the 1 mg dose, patient experienced hallucintation. Patient able to tolerate 0.38m BID   . 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 Corticosteroids Other (See Comments)    Hyperglycemia   . Levofloxacin Itching     Outpatient Medications Prior to Visit  Medication Sig Dispense Refill  . diclofenac Sodium (VOLTAREN) 1 %  GEL APPLY 2 GRAMS TO AFFECTED AREA 4 TIMES A DAY 500 g 1  . Accu-Chek Softclix Lancets lancets TEST 4 TIMES DAILY 200 each 6  . acetaminophen (TYLENOL) 500 MG tablet Take 1,000 mg by mouth 2 (two) times daily.     [name] Kitchen albuterol (VENTOLIN HFA) 108 (90 Base) MCG/ACT inhaler Inhale 2 puffs into the lungs every 6 (six) hours as needed for wheezing or shortness of breath. 18 g 3  . ammonium lactate (LAC-HYDRIN) 12 % lotion APPLY TWICE A DAY AS NEEDED FOR DRY SKIN 400 mL 3  . atorvastatin (LIPITOR) 40 MG tablet TAKE 1 TABLET BY MOUTH EVERY DAY (Patient taking differently: Take 40 mg by mouth daily. ) 90 tablet 3  . b complex vitamins capsule Take 1 capsule by mouth daily.    . baclofen (LIORESAL) 10 MG tablet Take 1 tablet (10 mg total) by mouth 3 (three) times daily as needed for muscle spasms. (Patient taking differently: Take 10 mg by mouth 3 (three) times daily. ) 90 each 0  . blood glucose meter kit and supplies KIT Dispense based on patient and insurance preference. Use up to four times daily as directed. (FOR ICD-9 250.00, 250.01). 1 each 0  . busPIRone (BUSPAR) 10 MG tablet TAKE 1 TABLET BY MOUTH THREE TIMES A DAY (Patient taking differently:  Take 10 mg by mouth 3 (three) times daily. ) 270 tablet 4  . diltiazem (CARDIZEM CD) 120 MG 24 hr capsule Take 1 capsule (120 mg total) by mouth 2 (two) times daily. 180 capsule 3  . docusate sodium (COLACE) 100 MG capsule Take 200 mg by mouth 2 (two) times daily.    . empagliflozin (JARDIANCE) 25 MG TABS tablet Take 25 mg by mouth daily.    . febuxostat (ULORIC) 40 MG tablet Take 40 mg by mouth at bedtime.     . fluticasone (FLONASE) 50 MCG/ACT nasal spray Place 2 sprays into both nostrils 2 (two) times daily. 16 g 6  . Fluticasone-Umeclidin-Vilant (TRELEGY ELLIPTA) 100-62.5-25 MCG/INH AEPB Inhale 1 puff into the lungs daily. 803 each 3  . folic acid (FOLVITE) 212 MCG tablet Take 400 mcg by mouth daily.    . furosemide (LASIX) 40 MG tablet TAKE 2 TABLETS (80 MG TOTAL) BY MOUTH 2 (TWO) TIMES DAILY. 360 tablet 1  . gabapentin (NEURONTIN) 300 MG capsule TAKE 2 CAPSULES (600 MG TOTAL) BY MOUTH 3 (THREE) TIMES DAILY. 540 capsule 2  . glucose blood (ACCU-CHEK AVIVA PLUS) test strip 1 each by Other route 3 (three) times daily. TEST 3 TIMES DAILY 300 each 3  . HUMALOG KWIKPEN 100 UNIT/ML KwikPen INJECT 3 UNITS INTO THE SKIN 3 TIMES DAILY BEFORE MEALS (Patient taking differently: Inject 8 Units into the skin 3 (three) times daily before meals. ) 15 mL 2  . HYDROcodone-acetaminophen (NORCO) 7.5-325 MG tablet Take 1 tablet by mouth every 6 (six) hours as needed for moderate pain. (Patient taking differently: Take 1 tablet by mouth in the morning, at noon, in the evening, and at bedtime. ) 20 tablet 0  . hydrOXYzine (ATARAX/VISTARIL) 10 MG tablet TAKE 1 TABLET BY MOUTH EVERY DAY (Patient taking differently: Take 10 mg by mouth daily. ) 90 tablet 1  . insulin glargine (LANTUS SOLOSTAR) 100 UNIT/ML Solostar Pen Inject 20 Units into the skin daily before breakfast. 15 pen 3  . Insulin Pen Needle (B-D UF III MINI PEN NEEDLES) 31G X 5 MM MISC CHECK SUGARS 4 TIMES A DAY BEFORE MEALS AND AT BEDTIME.  100 each 12  .  ketoconazole (NIZORAL) 2 % cream APPLY 1 FINGERTIP AMOUNT TO EACH FOOT DAILY. 30 g 0  . loratadine (CLARITIN) 10 MG tablet TAKE 1 TABLET BY MOUTH EVERY DAY (Patient taking differently: Take 10 mg by mouth daily. ) 90 tablet 0  . losartan (COZAAR) 100 MG tablet Take 1 tablet (100 mg total) by mouth daily. 90 tablet 3  . metolazone (ZAROXOLYN) 2.5 MG tablet Take 1 tablet (2.5 mg total) by mouth 2 (two) times a week. TAKE 1 TABLET TWICE WEEKLY ON MONDAYS AND THURSDAYS ONLY (Patient taking differently: Take 2.5 mg by mouth See admin instructions. TAKE 2.5 BY MOUTH  TWICE WEEKLY ON MONDAYS AND THURSDAYS ONLY) 15 tablet 6  . montelukast (SINGULAIR) 10 MG tablet TAKE 1 TABLET BY MOUTH EVERYDAY AT BEDTIME (Patient taking differently: Take 10 mg by mouth at bedtime. ) 90 tablet 3  . Multiple Vitamins-Minerals (MULTIVITAMIN WITH MINERALS) tablet Take 1 tablet by mouth daily.    . mupirocin ointment (BACTROBAN) 2 % APPLY TO AFFECTED AREA TWICE A DAY (Patient taking differently: Apply 1 application topically 2 (two) times daily as needed (skin fold (stomach)). ) 22 g 0  . NARCAN 4 MG/0.1ML LIQD nasal spray kit Place 1 spray into the nose as needed (accidental overdose).     . nicotine polacrilex (NICORETTE) 4 MG gum CHEW 1 EACH (4 MG TOTAL) BY MOUTH AS NEEDED FOR SMOKING CESSATION. (Patient taking differently: Take 4 mg by mouth as needed for smoking cessation. ) 100 each 2  . NON FORMULARY Uses a C-PAP at bedtime    . nystatin (MYCOSTATIN) 100000 UNIT/ML suspension Take 5 mLs (500,000 Units total) by mouth 4 (four) times daily. Swish and swallow. (Patient taking differently: Take 5 mLs by mouth 4 (four) times daily as needed (dry mouth). Swish and swallow.) 60 mL 3  . Omega-3 Fatty Acids (FISH OIL) 1000 MG CAPS Take 1,000 mg by mouth daily.     [name] Kitchen omeprazole (PRILOSEC) 20 MG capsule Take 1 capsule (20 mg total) by mouth daily. 90 capsule 3  . ondansetron (ZOFRAN) 4 MG tablet Take 1-2 tablets (4-8 mg total) by mouth  every 8 (eight) hours as needed for nausea or vomiting. 20 tablet 0  . oxyCODONE-acetaminophen (PERCOCET) 5-325 MG tablet Take 1-2 tablets by mouth every 8 (eight) hours as needed for severe pain. (Patient not taking: Reported on 05/24/2019) 30 tablet 0  . OXYGEN Inhale 2 L into the lungs continuous.     . potassium chloride SA (KLOR-CON M20) 20 MEQ tablet Take 1 tablet (20 mEq total) by mouth 2 (two) times a week. Take 1 tablet twice weekly on Monday and Thursday. (Patient taking differently: Take 20 mEq by mouth See admin instructions. Take 20 mEq by mouth twice weekly on Monday and Thursday.) 15 tablet 6  . promethazine (PHENERGAN) 25 MG tablet Take 1 tablet (25 mg total) by mouth every 6 (six) hours as needed for nausea. 30 tablet 1  . sertraline (ZOLOFT) 50 MG tablet TAKE 1 TABLET BY MOUTH EVERY DAY 90 tablet 1  . varenicline (CHANTIX) 0.5 MG tablet Take 1 tablet (0.5 mg total) by mouth 2 (two) times daily. 60 tablet 5   Facility-Administered Medications Prior to Visit  Medication Dose Route Frequency Provider Last Rate Last Admin  . technetium tetrofosmin (TC-MYOVIEW) injection 93.2 millicurie  67.1 millicurie Intravenous Once PRN Turner, Eber Hong, MD        ROS  Objective:  Physical Exam  There were no vitals filed for this visit.   on RA BMI Readings from Last 3 Encounters:  05/07/19 53.00 kg/m  05/05/19 52.95 kg/m  04/27/19 52.93 kg/m   Wt Readings from Last 3 Encounters:  05/07/19 (!) 369 lb 6.4 oz (167.6 kg)  05/05/19 (!) 369 lb (167.4 kg)  04/27/19 (!) 368 lb 14.4 oz (167.3 kg)    Chest Imaging: September 2019 CT chest: Right upper lobe 6 mm lung nodule, irregular, 4 mm irregular dense nodule left upper lobe The patient's images have been independently reviewed by me.    CT chest 08/07/2018: Reviewed images today in the office again with the patient regarding her CT chest in July 2020.  Patient has a 12 mm subsolid nodule within the right upper lobe with slight  increase from previous imaging.  Recommending follow-up to document stability. The patient's images have been independently reviewed by me.    03/03/2019 CT chest: 7 mm right upper lobe pulmonary nodule stable from March 2020 however mildly enlarged from the 2019 images. The patient's images have been independently reviewed by me.    Pulmonary Functions Testing Results: PFT Results Latest Ref Rng & Units 08/18/2018  FVC-Pre L 2.32  FVC-Predicted Pre % 66  FVC-Post L 2.29  FVC-Predicted Post % 65  Pre FEV1/FVC % % 68  Post FEV1/FCV % % 69  FEV1-Pre L 1.57  FEV1-Predicted Pre % 56  FEV1-Post L 1.58  DLCO UNC% % 49  DLCO COR %Predicted % 88  TLC L 3.41  TLC % Predicted % 57  RV % Predicted % 51    FeNO: None   Pathology: None  Echocardiogram:  Study Conclusions - Left ventricle: The cavity size was normal. Wall thickness was   normal. Systolic function was normal. The estimated ejection   fraction was in the range of 55% to 60%. Wall motion was normal;   there were no regional wall motion abnormalities. Doppler   parameters are consistent with abnormal left ventricular   relaxation (grade 1 diastolic dysfunction).  Heart Catheterization: None      Assessment & Plan:      ICD-10-CM   1. COPD (chronic obstructive pulmonary disease) with chronic bronchitis (HCC)  J44.9   2. Nodule of upper lobe of right lung  R91.1   3. Cigarette smoker  F17.210   4. Morbid obesity due to excess calories (HCC)  E66.01     Discussion:  ***  This is a 54 year old morbidly obese female, OSA on BiPAP OHS.  Has moderate COPD with mixed restrictive and obstructiveDefects due to smoking history and COPD.  Unfortunately she is still smoking.  This is the most important thing that she can do for her health.  If she continues to smoke with her current disease state she is going to have continued progressive symptoms and likely continued decline risking worsening of her health or death.  This  was again discussed with the patient today.  She has already chronic hypoxemic respiratory failure.  And would like to potentially transition to POC use instead of continuous oxygen but I do not believe that she would qualify for POC but would like to try again.  I have encouraged her to restart the use of her Chantix She needs refills today of Trelegy plus albuterol which she can continue Patient was counseled on weight loss as well as smoking cessation.  These were the 2 most important things that she can do for her health at this time.  Plan Following Extensive Data Review & Interpretation:  . I reviewed prior external note(s) from 02/16/2019 gastroenterology, Thornton Park, MD.  Recommending colonoscopy.  02/12/2019 PCP Dr. Caron Presume.  . I reviewed the result(s) of 12/25/2018: Covid negative, multiple baseline hemoglobins elevated, August 2020 300 absolute eosinophil count . I have ordered repeat chest CT imaging has been ordered from last office visit and scheduled for February 2021.  Super D CT imaging has been ordered.  She is post to have this done soon. I have ordered refills of her albuterol as well as refills of her Trelegy inhaler. I have ordered for her to have qualification for a portable oxygen concentrator.  Independent interpretation of tests . Review of patient's 08/07/2018 images revealed 12 mm subsolid right upper lobe nodule slightly increased in size.  His images were reviewed with the patient today.  Plans for a super D CT scheduled.  The patient's images have been independently reviewed by me.     Garner Nash, DO [facility] 06/09/2019 7:34 AM

## 2019-06-11 ENCOUNTER — Ambulatory Visit: Payer: Self-pay

## 2019-06-11 ENCOUNTER — Ambulatory Visit: Payer: Medicare Other | Admitting: Physical Therapy

## 2019-06-11 ENCOUNTER — Ambulatory Visit (INDEPENDENT_AMBULATORY_CARE_PROVIDER_SITE_OTHER): Payer: Medicare Other | Admitting: Orthopaedic Surgery

## 2019-06-11 ENCOUNTER — Other Ambulatory Visit: Payer: Self-pay

## 2019-06-11 ENCOUNTER — Encounter: Payer: Self-pay | Admitting: Orthopaedic Surgery

## 2019-06-11 DIAGNOSIS — M1611 Unilateral primary osteoarthritis, right hip: Secondary | ICD-10-CM

## 2019-06-11 DIAGNOSIS — Z6841 Body Mass Index (BMI) 40.0 and over, adult: Secondary | ICD-10-CM | POA: Diagnosis not present

## 2019-06-11 NOTE — Progress Notes (Signed)
Subjective: Patient is here for ultrasound-guided intra-articular right hip injection.   Injection in December gave her great relief until a few days ago.  Objective: Pain with passive internal rotation.  Procedure: Ultrasound-guided right hip injection: After sterile prep with Betadine, injected 8 cc 1% lidocaine without epinephrine and 40 mg methylprednisolone using a 22-gauge spinal needle, passing the needle through the iliofemoral ligament into the femoral head/neck junction. Injectate seen filling the joint capsule. Follow-up as needed.

## 2019-06-12 ENCOUNTER — Other Ambulatory Visit: Payer: Self-pay | Admitting: Family Medicine

## 2019-06-12 DIAGNOSIS — G4733 Obstructive sleep apnea (adult) (pediatric): Secondary | ICD-10-CM | POA: Diagnosis not present

## 2019-06-12 DIAGNOSIS — M5090 Cervical disc disorder, unspecified, unspecified cervical region: Secondary | ICD-10-CM | POA: Diagnosis not present

## 2019-06-12 DIAGNOSIS — Z6841 Body Mass Index (BMI) 40.0 and over, adult: Secondary | ICD-10-CM | POA: Insufficient documentation

## 2019-06-12 NOTE — Progress Notes (Signed)
Office Visit Note   Patient: [name]           Date of Birth: 02/24/1965           MRN: [id number] Visit Date: 06/11/2019              Requested by: [name], MD [address],   [id number] PCP: [name], MD   Assessment & Plan: Visit Diagnoses:  1. Primary osteoarthritis of right hip   2. Body mass index 50.0-59.9, adult ([place])   3. Morbid obesity ([place])     Plan: Impression is right hip DJD.  We sent her upstairs for another injection with Dr. Junius Roads today.  I will see her back as scheduled for her shoulder.  Follow-Up Instructions: Return if symptoms worsen or fail to improve.   Orders:  Orders Placed This Encounter  Procedures  . XR HIP UNILAT W OR W/O PELVIS 2-3 VIEWS RIGHT  . US Guided Needle Placement   No orders of the defined types were placed in this encounter.     Procedures: No procedures performed   Clinical Data: No additional findings.   Subjective: Chief Complaint  Patient presents with  . Right Hip - Pain    [name] is here for recurrent right hip pain.  She has right hip DJD.  Previous cortisone injection in December has lasted till last week.  She is hoping to get another injection.  No change in medical history.  Her shoulder is doing well from the rotator cuff surgery.   Review of Systems  Constitutional: Negative.   HENT: Negative.   Eyes: Negative.   Respiratory: Negative.   Cardiovascular: Negative.   Endocrine: Negative.   Musculoskeletal: Negative.   Neurological: Negative.   Hematological: Negative.   Psychiatric/Behavioral: Negative.   All other systems reviewed and are negative.    Objective: Vital Signs: LMP 04/10/2006   Physical Exam Vitals and nursing note reviewed.  Constitutional:      Appearance: She is well-developed.  Pulmonary:     Effort: Pulmonary effort is normal.  Skin:    General: Skin is warm.     Capillary Refill: Capillary refill takes less than 2 seconds.    Neurological:     Mental Status: She is alert and oriented to person, place, and time.  Psychiatric:        Behavior: Behavior normal.        Thought Content: Thought content normal.        Judgment: Judgment normal.     Ortho Exam Right hip exam is unchanged. Specialty Comments:  No specialty comments available.  Imaging: US Guided Needle Placement  Result Date: 06/11/2019 Ultrasound-guided right hip injection: After sterile prep with Betadine, injected 8 cc 1% lidocaine without epinephrine and 40 mg methylprednisolone using a 22-gauge spinal needle, passing the needle through the iliofemoral ligament into the femoral head/neck junction. Injectate seen filling the joint capsule. Follow-up as needed.  XR HIP UNILAT W OR W/O PELVIS 2-3 VIEWS RIGHT  Result Date: 06/12/2019 Soft tissue shadows obscure bony detail but appears that she has moderately severe right hip DJD    PMFS History: Patient Active Problem List   Diagnosis Date Noted  . Body mass index 50.0-59.9, adult ([place]) 06/12/2019  . Constipation due to outlet dysfunction   . Impingement syndrome of left shoulder 05/07/2019  . Polyp of colon   . Polyp of ascending colon   . Rectal discomfort 02/12/2019  . Osteoarthritis of  right hip 02/12/2019  . Nontraumatic tear of left supraspinatus tendon 12/30/2018  . Gout 12/17/2018  . Nontraumatic complete tear of right rotator cuff 12/08/2018  . Rotator cuff tear, right 12/01/2018  . Chest pain 09/24/2018  . Right groin pain 07/20/2018  . Depressed mood 07/13/2018  . Mouth pain 06/02/2018  . Paroxysmal SVT (supraventricular tachycardia) ([place]) 03/20/2018  . Pure hypercholesterolemia 03/20/2018  . Muscle cramping 02/25/2018  . Nodule of upper lobe of right lung 02/13/2018  . Falls, subsequent encounter 01/30/2018  . Pain in left foot 11/04/2017  . Open wound of left foot 11/04/2017  . Essential hypertension 10/27/2017  . Solitary pulmonary nodule 10/07/2017  .  Restrictive lung disease secondary to obesity 10/01/2017  . Polycythemia, secondary 10/01/2017  . Chronic viral hepatitis B without delta-agent ([place]) 07/08/2017  . COPD (chronic obstructive pulmonary disease) with chronic bronchitis ([place]) 06/27/2017  . Chronic kidney disease (CKD), stage IV (severe) ([place]) 01/14/2017  . Type 2 diabetes mellitus with stage 3 chronic kidney disease, with long-term current use of insulin ([place]) 12/12/2016  . Urge incontinence of urine 12/05/2016  . Trigger finger, left index finger 07/15/2016  . Back pain 04/07/2014  . Morbid obesity ([place]) 10/02/2012  . Chronic diastolic CHF (congestive heart failure) ([employer]) 04/07/2012  . GERD 01/26/2010  . Obstructive sleep apnea treated with BiPAP 02/03/2008  . FIBROCYSTIC BREAST DISEASE 10/28/2006  . Hyperlipidemia 03/27/2006  . Obesity hypoventilation syndrome ([place]) 03/27/2006  . Former tobacco use 03/27/2006  . Depression with anxiety 03/27/2006   Past Medical History:  Diagnosis Date  . Alcoholism ([place])   . Anxiety   . Arthritis 04-10-11   hips, shoulders, back  . Asthma   . Bipolar disorder ([place])   . Cardiomegaly   . Cervical cancer ([place]) 1993   cervical, no treatment done, went away per pt  . Cervical dysplasia or atypia 04-10-11   '93- once dx.-got pregnant-no intervention, then postpartum, no dysplasia found  . CHF (congestive heart failure) ([place])    no cardiologist 2014 dx, none now  . Chronic hypoxemic respiratory failure ([place])   . Chronic kidney disease    stage 3 kidney disease  . 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 Pathology showed Condyloma, gluteal CONDYLOMA ACUMINATUM.   [name] Kitchen COPD (chronic obstructive pulmonary disease) ([place])   . Depression   . Diabetes mellitus without complication ([place])   . Dyspnea    with actity, sitting  . Fall 12/16/2016  . Fibrocystic breast disease   . GERD (gastroesophageal reflux disease)   . Grade I diastolic dysfunction 40/34/7425   Noted  on ECHO  . Hepatitis    hep B-count is low at present,doesn't register  . History of PSVT (paroxysmal supraventricular tachycardia)   . Hypercalcemia   . Hyperlipidemia   . Hypertension   . Incomplete left bundle branch block (LBBB) 09/24/2018   Noted on EKG  . Lung nodule 11/2017   right and left lung  . Migraine headache    none recent  . Morbid obesity ([place]) 03/27/2006  . Neuropathy   . Pneumonia    walking pneu 15 yrs. ago  . Skin lesion 03/15/2011   In gluteal crease now s/p removal by Dr. [name] of General Surgery on 3/12. Path shows condyloma.     . Sleep apnea 04-10-11   uses cpap, pt does not know settings  . Trigger finger    left third  . Urge incontinence of urine  Family History  Problem Relation Age of Onset  . Pulmonary embolism Mother   . Stroke Father   . Alcohol abuse Father   . Pneumonia Father   . Hypertension Father   . Breast cancer Maternal Aunt   . Hypertension Other   . Diabetes Other        Aunts and cousins  . Asthma Sister   . Depression Sister   . Arthritis Sister   . Sickle cell anemia Son     Past Surgical History:  Procedure Laterality Date  . ANAL FISTULECTOMY  04/17/2011   Procedure: FISTULECTOMY ANAL;  Surgeon: Imogene Burn. [name], MD;  Location: [facility];  Service: General;  Laterality: N/A;  Excision of Condyloma Gluteal Cleft   . ANAL RECTAL MANOMETRY N/A 03/26/2019   Procedure: ANO RECTAL MANOMETRY;  Surgeon: Thornton Park, MD;  Location: [facility];  Service: Gastroenterology;  Laterality: N/A;  . BIOPSY  03/09/2019   Procedure: BIOPSY;  Surgeon: Thornton Park, MD;  Location: [facility];  Service: Gastroenterology;;  . Lum Ke[place] INJECTION N/A 12/29/2018   Procedure: BOTOX INJECTION(100 UNITS)  WITH CYSTOSCOPY;  Surgeon: Ceasar Mons, MD;  Location: [facility];  Service: Urology;  Laterality: N/A;  . BREAST CYST EXCISION     bilateral breast, 3 cysts removed from each breast  . BREAST EXCISIONAL BIOPSY Right    x 3  .  BREAST EXCISIONAL BIOPSY Left    x 3  . CARPAL TUNNEL RELEASE Bilateral   . Cervical biospy    . CERVICAL FUSION  04-10-11   x3- cervical fusion with plating and screws-Dr. [name]  . COLONOSCOPY WITH PROPOFOL N/A 07/06/2015   Procedure: COLONOSCOPY WITH PROPOFOL;  Surgeon: [name], MD;  Location: [facility];  Service: Endoscopy;  Laterality: N/A;  . COLONOSCOPY WITH PROPOFOL N/A 03/09/2019   Procedure: COLONOSCOPY WITH PROPOFOL;  Surgeon: Thornton Park, MD;  Location: [facility];  Service: Gastroenterology;  Laterality: N/A;  . DOPPLER ECHOCARDIOGRAPHY  04/06/2012   AT [facility] 55-60%  . FOOT SURGERY Left 2018  . FRACTURE SURGERY     little toe left foot  . I & D EXTREMITY Left 12/18/2016   Procedure: IRRIGATION AND DEBRIDEMENT LEFT FOOT, CLOSURE;  Surgeon: [name], MD;  Location: [facility];  Service: Orthopedics;  Laterality: Left;  . [place] SURGERY  04-10-11   x5-Lumbar fusion-retained hardware.(Dr. Louanne Skye)  . MULTIPLE TOOTH EXTRACTIONS    . POLYPECTOMY  03/09/2019   Procedure: POLYPECTOMY;  Surgeon: Thornton Park, MD;  [facility];  Service: Gastroenterology;;  . SHOULDER ARTHROSCOPY WITH ROTATOR CUFF REPAIR AND SUBACROMIAL DECOMPRESSION Left 05/07/2019   Procedure: LEFT SHOULDER ARTHROSCOPY WITH DEBRIDEMENT, DISTAL CLAVICLE EXCISION,  SUBACROMIAL DECOMPRESSION AND POSSIBLE ROTATOR CUFF REPAIR;  Surgeon: [name], MD;  Location: [facility];  Service: Orthopedics;  Laterality: Left;  . TEE WITHOUT CARDIOVERSION N/A 04/06/2012   Procedure: TRANSESOPHAGEAL ECHOCARDIOGRAM (TEE);  Surgeon: [name], MD;  Location: [facility];  Service: Cardiovascular;  Laterality: N/A;  . TRIGGER FINGER RELEASE Right    middle finger  . TRIGGER FINGER RELEASE Left 06/28/2016   Procedure: RELEASE TRIGGER FINGER LEFT 3RD FINGER;  Surgeon: [name], MD;  Location: [facility];  Service: Orthopedics;  Laterality: Left;  . TRIGGER FINGER RELEASE Right 06/12/2016   Procedure:  RIGHT INDEX FINGER TRIGGER RELEASE;  Surgeon: [name], MD;  Location: [facility];  Service: Orthopedics;  Laterality: Right;  . TRIGGER FINGER RELEASE Right 01/19/2018   Procedure: RIGHT  RING FINGER TRIGGER FINGER RELEASE;  Surgeon: [name], MD;  Location: [facility];  Service: Orthopedics;  Laterality: Right;  . TRIGGER FINGER RELEASE Left 05/07/2019   Procedure: RELEASE TRIGGER FINGER LEFT INDEX FINGER;  Surgeon: [name], MD;  Location: [facility];  Service: Orthopedics;  Laterality: Left;   Social History   Occupational History  . Occupation: disabled    Comment: Disabled  Tobacco Use  . Smoking status: Current Every Day Smoker    Packs/day: 0.20    Years: 40.00    Pack years: 8.00    Types: Cigarettes    Start date: 01/28/1977  . Smokeless tobacco: Never Used  . Tobacco comment: 4-5 cigarettes per day  Substance and Sexual Activity  . Alcohol use: No    Alcohol/week: 0.0 standard drinks  . Drug use: Not Currently    Types: Marijuana, "Crack" cocaine    Comment: hx marijuana usemany years ago, Crack -none since 11/2015  . Sexual activity: Not Currently    Partners: Male    Birth control/protection: Post-menopausal

## 2019-06-15 ENCOUNTER — Encounter: Payer: Self-pay | Admitting: Family Medicine

## 2019-06-15 ENCOUNTER — Ambulatory Visit: Payer: Medicare Other | Admitting: Physical Therapy

## 2019-06-17 ENCOUNTER — Encounter: Payer: Self-pay | Admitting: Adult Health

## 2019-06-18 ENCOUNTER — Ambulatory Visit: Payer: Medicare Other | Admitting: Physical Therapy

## 2019-06-20 ENCOUNTER — Encounter: Payer: Self-pay | Admitting: Adult Health

## 2019-06-21 ENCOUNTER — Telehealth: Payer: Self-pay | Admitting: Adult Health

## 2019-06-21 ENCOUNTER — Other Ambulatory Visit: Payer: Self-pay

## 2019-06-21 ENCOUNTER — Other Ambulatory Visit: Payer: Self-pay | Admitting: *Deleted

## 2019-06-21 ENCOUNTER — Encounter: Payer: Self-pay | Admitting: Family Medicine

## 2019-06-21 ENCOUNTER — Ambulatory Visit (INDEPENDENT_AMBULATORY_CARE_PROVIDER_SITE_OTHER): Payer: Medicare Other | Admitting: Family Medicine

## 2019-06-21 VITALS — BP 128/64 | HR 49 | Ht 70.0 in | Wt 368.0 lb

## 2019-06-21 DIAGNOSIS — F418 Other specified anxiety disorders: Secondary | ICD-10-CM | POA: Diagnosis not present

## 2019-06-21 DIAGNOSIS — N183 Chronic kidney disease, stage 3 unspecified: Secondary | ICD-10-CM

## 2019-06-21 DIAGNOSIS — E1122 Type 2 diabetes mellitus with diabetic chronic kidney disease: Secondary | ICD-10-CM | POA: Diagnosis not present

## 2019-06-21 DIAGNOSIS — Z794 Long term (current) use of insulin: Secondary | ICD-10-CM | POA: Diagnosis not present

## 2019-06-21 DIAGNOSIS — Z720 Tobacco use: Secondary | ICD-10-CM | POA: Diagnosis not present

## 2019-06-21 DIAGNOSIS — F320 Major depressive disorder, single episode, mild: Secondary | ICD-10-CM

## 2019-06-21 DIAGNOSIS — G4733 Obstructive sleep apnea (adult) (pediatric): Secondary | ICD-10-CM

## 2019-06-21 LAB — POCT GLYCOSYLATED HEMOGLOBIN (HGB A1C): HbA1c, POC (controlled diabetic range): 8.4 % — AB (ref 0.0–7.0)

## 2019-06-21 MED ORDER — VARENICLINE TARTRATE 1 MG PO TABS: 1.0000 mg | ORAL_TABLET | Freq: Two times a day (BID) | ORAL | 3 refills | Status: DC

## 2019-06-21 MED ORDER — SERTRALINE HCL 100 MG PO TABS: 100.0000 mg | ORAL_TABLET | Freq: Every day | ORAL | 3 refills | Status: DC

## 2019-06-21 MED ORDER — INSULIN LISPRO (1 UNIT DIAL) 100 UNIT/ML (KWIKPEN): 8.0000 [IU] | PEN_INJECTOR | Freq: Three times a day (TID) | SUBCUTANEOUS | 3 refills | Status: DC

## 2019-06-21 NOTE — Telephone Encounter (Signed)
Mychart message from patient: [name], I have to make an appointment with you. So I can bring in my BiPap machine. The air it blows out is so powerful and when I take it off my heart has to slow down. It's blowing higher than it was before they supposed to turn it down. I have to catch my breath also when I take it off.

## 2019-06-21 NOTE — Patient Instructions (Addendum)
It was wonderful to see you today!  I am sorry you are having such a rough time with your hip.  Regarding your diabetes your hemoglobin A1c is 8.4 today from 7.4 at last visit.  This is most likely due to the fact that you are no longer taking her Ozempic.  We have scheduled you an appointment with Dr. Valentina Lucks to discuss medication management.  Regarding her smoking cessation I am going to increase your Chantix dose to 1 mg twice daily from 0.5 mg twice daily.  Regarding your depression I would like to increase your Zoloft to 100 mg daily.  If you have any issues with any of these medications please let us know.  I hope you have a wonderful day!

## 2019-06-21 NOTE — Addendum Note (Signed)
Addended by: Trudie Buckler on: 06/21/2019 01:27 PM   Modules accepted: Orders

## 2019-06-21 NOTE — Telephone Encounter (Signed)
Responded to pt's mychart message with this information. I have also sent the order to [facility] via community message.

## 2019-06-21 NOTE — Telephone Encounter (Signed)
I called pt. She is insistent that she needs to bring her bipap in to have someone in our office to look at it. She says that pressure is "too high" and it is affecting her heart. She does not want to just lower the pressure as Jinny Blossom, NP ordered today. She wants to come in for an appt. I offered pt several appts in the next 2 weeks with Jinny Blossom, NP and Amy, NP but she couldn't make those appts due to scheduling conflicts. Dr. Rexene Alberts does not have any sooner appts either. I advised her that I will check with our sleep lab and see if they don't mind meeting with her to take a look at her bipap and discuss the pressure settings with her. She is quite insistent that she needs to "show" [place] what is going on.

## 2019-06-21 NOTE — Telephone Encounter (Signed)
I spoke with Jinny Blossom, NP. She recommends that the DME check out the pt's bipap and also reduce the pressure as she ordered today. An appt with [place] is not needed to check out her bipap machine.  I called pt and reiterated this. Pt is agreeable to this plan. I have sent [place] another message.

## 2019-06-21 NOTE — Telephone Encounter (Signed)
Patient is current pressure is 18/14 with residual AHI of 1 I will reduce her pressure to 17/13 order has been placed

## 2019-06-21 NOTE — Telephone Encounter (Signed)
Pt called asking to schedule an appointment to see Megan,NP re: the volume issues with her Bipap.  When pt was told of Jinny Blossom not having anything within the next few weeks at this time she has asked for a call back. Pt is asking for a call so she can discuss the issues with her Bipap, please call

## 2019-06-21 NOTE — Progress Notes (Signed)
    SUBJECTIVE:   CHIEF COMPLAINT / HPI:  Type 2 diabetes Patient is here for diabetes checkup.  She reports poor glucose control over the past few weeks.  She says that she got a steroid injection into her hip and since that time she has been having trouble controlling her sugars.  She reports her blood glucoses over the past 2 weeks have ranged in the mid 200s but this week have been in the 150s to 190s..  She endorses polyuria, polydipsia last week but that has improved.  Patient says that she was on Ozempic but she thought that she was supposed to stop that after her last checkup with pharmacy.  Smoking cessation Patient reports that she is still taking her Chantix but that she has been taking 2 0.5 pills at a time rather than taking 1 0.5 pills which she is prescribed.  She would like to increase her dose to 1 mg.  Depression Patient reports she feels her depression is increased.  She feels sadder than normal.  Denies any SI or HI.  She would like to go up on her Zoloft at this time because she was on a higher dose in the past.  Per our records she was on 100 mg daily in 2016.   OBJECTIVE:   BP 128/64   Pulse (!) 49   Ht 5\' 10"  (1.778 m)   Wt (!) 166.9 kg   LMP 04/10/2006   SpO2 95%   BMI 52.80 kg/m   General: Well-appearing, sitting in chair with a sling on left arm. Cardio: Regular rate and rhythm, no murmurs noted Respiratory: Normal work of breathing although increased work of breathing with ambulation, lungs are clear to auscultation bilaterally MSK: Ambulates with cane but has some difficulty   ASSESSMENT/PLAN:   Type 2 diabetes mellitus with stage 3 chronic kidney disease, with long-term current use of insulin ([place]) Patient reports poor glucose control over the last couple of weeks.  She says that her blood sugars have ranged in the mid 100s to low 200s.  She reports good compliance with her insulin and Metformin.  She says that her Ozempic was discontinued by Dr. Everitt Amber.   I am unable to find any reports of this in her chart. -Continue Lantus 20 units daily in the morning -Continue Humalog 8 units 3 times daily before meals -Scheduled appointment with Dr. Everitt Amber to discuss Ozempic dosing -Counseled on symptoms and management of hypoglycemia -Follow-up diabetes checkup in 3 months  Depression with anxiety Patient reports worsening depression recently.  She says that she feels more tired and down and depressed.  She feels like her sertraline is not working well and would like to go up on the dose. -Increased from 50 mg to 100 mg daily -PHQ-9 at next visit -Follow-up with symptoms in 1 month  Tobacco abuse Patient is prescribed Chantix 0.5 mg twice daily.  She reports she has been taking 2 pills twice daily at a total of 1 mg twice daily.  This dosage was listed in her allergies because it was said to cause hallucinations.  She reports that she has had no issues and would like to go up to 1 mg twice a day. -Prescribed Reglan 1 mg twice a day   [name], MD [place]

## 2019-06-22 ENCOUNTER — Ambulatory Visit (INDEPENDENT_AMBULATORY_CARE_PROVIDER_SITE_OTHER): Payer: Medicare Other | Admitting: Orthopaedic Surgery

## 2019-06-22 ENCOUNTER — Encounter: Payer: Self-pay | Admitting: Orthopaedic Surgery

## 2019-06-22 ENCOUNTER — Ambulatory Visit: Payer: Medicare Other | Admitting: Physical Therapy

## 2019-06-22 ENCOUNTER — Ambulatory Visit: Payer: Self-pay | Admitting: Licensed Clinical Social Worker

## 2019-06-22 ENCOUNTER — Encounter: Payer: Self-pay | Admitting: Physical Therapy

## 2019-06-22 DIAGNOSIS — M25512 Pain in left shoulder: Secondary | ICD-10-CM | POA: Diagnosis not present

## 2019-06-22 DIAGNOSIS — M6281 Muscle weakness (generalized): Secondary | ICD-10-CM | POA: Diagnosis not present

## 2019-06-22 DIAGNOSIS — Z741 Need for assistance with personal care: Secondary | ICD-10-CM

## 2019-06-22 DIAGNOSIS — Z9889 Other specified postprocedural states: Secondary | ICD-10-CM

## 2019-06-22 DIAGNOSIS — M75121 Complete rotator cuff tear or rupture of right shoulder, not specified as traumatic: Secondary | ICD-10-CM

## 2019-06-22 DIAGNOSIS — M1712 Unilateral primary osteoarthritis, left knee: Secondary | ICD-10-CM | POA: Diagnosis not present

## 2019-06-22 DIAGNOSIS — R29898 Other symptoms and signs involving the musculoskeletal system: Secondary | ICD-10-CM | POA: Diagnosis not present

## 2019-06-22 DIAGNOSIS — M65322 Trigger finger, left index finger: Secondary | ICD-10-CM

## 2019-06-22 MED ORDER — LIDOCAINE HCL 1 % IJ SOLN
2.0000 mL | INTRAMUSCULAR | Status: AC | PRN
  Administered 2019-06-22: 2 mL

## 2019-06-22 MED ORDER — METHYLPREDNISOLONE ACETATE 40 MG/ML IJ SUSP
40.0000 mg | INTRAMUSCULAR | Status: AC | PRN
  Administered 2019-06-22: 40 mg via INTRA_ARTICULAR

## 2019-06-22 MED ORDER — BUPIVACAINE HCL 0.5 % IJ SOLN
2.0000 mL | INTRAMUSCULAR | Status: AC | PRN
  Administered 2019-06-22: 2 mL via INTRA_ARTICULAR

## 2019-06-22 NOTE — Assessment & Plan Note (Signed)
Patient reports worsening depression recently.  She says that she feels more tired and down and depressed.  She feels like her sertraline is not working well and would like to go up on the dose. -Increased from 50 mg to 100 mg daily -PHQ-9 at next visit -Follow-up with symptoms in 1 month

## 2019-06-22 NOTE — Assessment & Plan Note (Signed)
Patient reports poor glucose control over the last couple of weeks.  She says that her blood sugars have ranged in the mid 100s to low 200s.  She reports good compliance with her insulin and Metformin.  She says that her Ozempic was discontinued by Dr. Everitt Amber.  I am unable to find any reports of this in her chart. -Continue Lantus 20 units daily in the morning -Continue Humalog 8 units 3 times daily before meals -Scheduled appointment with Dr. Everitt Amber to discuss Ozempic dosing -Counseled on symptoms and management of hypoglycemia -Follow-up diabetes checkup in 3 months

## 2019-06-22 NOTE — Assessment & Plan Note (Signed)
Patient is prescribed Chantix 0.5 mg twice daily.  She reports she has been taking 2 pills twice daily at a total of 1 mg twice daily.  This dosage was listed in her allergies because it was said to cause hallucinations.  She reports that she has had no issues and would like to go up to 1 mg twice a day. -Prescribed Reglan 1 mg twice a day

## 2019-06-22 NOTE — Telephone Encounter (Signed)
Order for cpap supples sent to [facility] via community msg. Confirmation received that the order transmitted was successful.

## 2019-06-22 NOTE — Therapy (Signed)
[place], [place], [address] Phone: [phone]   Fax:  [phone]  Physical Therapy Treatment  Patient Details  Name: [name] MRN: [id number] Date of Birth: Mar 31, 1965 Referring Provider (PT): Dr. Erlinda Hong    Encounter Date: 06/22/2019  PT End of Session - 06/22/19 1234    Visit Number  3    Number of Visits  16    Date for PT Re-Evaluation  07/19/19    Authorization Type  [employer] MCR and MCD    PT Start Time  1132    PT Stop Time  1215    PT Time Calculation (min)  43 min    Equipment Utilized During Treatment  Other (comment)   wheelchair   Activity Tolerance  Patient tolerated treatment well    Behavior During Therapy  [facility] for tasks assessed/performed       Past Medical History:  Diagnosis Date  . Alcoholism ([place])   . Anxiety   . Arthritis 04-10-11   hips, shoulders, back  . Asthma   . Bipolar disorder ([place])   . Cardiomegaly   . Cervical cancer ([place]) 1993   cervical, no treatment done, went away per pt  . Cervical dysplasia or atypia 04-10-11   '93- once dx.-got pregnant-no intervention, then postpartum, no dysplasia found  . CHF (congestive heart failure) ([place])    no cardiologist 2014 dx, none now  . Chronic hypoxemic respiratory failure ([place])   . Chronic kidney disease    stage 3 kidney disease  . 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 Pathology showed Condyloma, gluteal CONDYLOMA ACUMINATUM.   [name] Kitchen COPD (chronic obstructive pulmonary disease) ([place])   . Depression   . Diabetes mellitus without complication ([place])   . Dyspnea    with actity, sitting  . Fall 12/16/2016  . Fibrocystic breast disease   . GERD (gastroesophageal reflux disease)   . Grade I diastolic dysfunction 36/64/4034   Noted on ECHO  . Hepatitis    hep B-count is low at present,doesn't register  . History of PSVT (paroxysmal supraventricular tachycardia)   . Hypercalcemia   .  Hyperlipidemia   . Hypertension   . Incomplete left bundle branch block (LBBB) 09/24/2018   Noted on EKG  . Lung nodule 11/2017   right and left lung  . Migraine headache    none recent  . Morbid obesity ([place]) 03/27/2006  . Neuropathy   . Pneumonia    walking pneu 15 yrs. ago  . Skin lesion 03/15/2011   In gluteal crease now s/p removal by Dr. [name] of General Surgery on 3/12. Path shows condyloma.     . Sleep apnea 04-10-11   uses cpap, pt does not know settings  . Trigger finger    left third  . Urge incontinence of urine     Past Surgical History:  Procedure Laterality Date  . ANAL FISTULECTOMY  04/17/2011   Procedure: FISTULECTOMY ANAL;  Surgeon: Imogene Burn. [name], MD;  [facility];  Service: General;  Laterality: N/A;  Excision of Condyloma Gluteal Cleft   . ANAL RECTAL MANOMETRY N/A 03/26/2019   Procedure: ANO RECTAL MANOMETRY;  Surgeon: Thornton Park, MD;  [facility];  Service: Gastroenterology;  Laterality: N/A;  . BIOPSY  03/09/2019   Procedure: BIOPSY;  Surgeon: Thornton Park, MD;  [facility];  Service: Gastroenterology;;  . BOTOX INJECTION N/A 12/29/2018   Procedure: BOTOX INJECTION(100 UNITS)  WITH CYSTOSCOPY;  Surgeon: Ceasar Mons, MD;  [facility];  Service: Urology;  Laterality: N/A;  . BREAST CYST EXCISION     bilateral breast, 3 cysts removed from each breast  . BREAST EXCISIONAL BIOPSY Right    x 3  . BREAST EXCISIONAL BIOPSY Left    x 3  . CARPAL TUNNEL RELEASE Bilateral   . Cervical biospy    . CERVICAL FUSION  04-10-11   x3- cervical fusion with plating and screws-Dr. [name]  . COLONOSCOPY WITH PROPOFOL N/A 07/06/2015   Procedure: COLONOSCOPY WITH PROPOFOL;  Surgeon: [name], MD;  [facility];  Service: Endoscopy;  Laterality: N/A;  . COLONOSCOPY WITH PROPOFOL N/A 03/09/2019   Procedure: COLONOSCOPY WITH PROPOFOL;  Surgeon: Thornton Park, MD;  [facility];  Service: Gastroenterology;   Laterality: N/A;  . DOPPLER ECHOCARDIOGRAPHY  04/06/2012   AT [place] 55-60%  . FOOT SURGERY Left 2018  . FRACTURE SURGERY     little toe left foot  . I & D EXTREMITY Left 12/18/2016   Procedure: IRRIGATION AND DEBRIDEMENT LEFT FOOT, CLOSURE;  Surgeon: [name], MD;  Location: [facility];  Service: Orthopedics;  Laterality: Left;  . [place] SURGERY  04-10-11   x5-Lumbar fusion-retained hardware.(Dr. Louanne Skye)  . MULTIPLE TOOTH EXTRACTIONS    . POLYPECTOMY  03/09/2019   Procedure: POLYPECTOMY;  Surgeon: Thornton Park, MD;  [facility];  Service: Gastroenterology;;  . SHOULDER ARTHROSCOPY WITH ROTATOR CUFF REPAIR AND SUBACROMIAL DECOMPRESSION Left 05/07/2019   Procedure: LEFT SHOULDER ARTHROSCOPY WITH DEBRIDEMENT, DISTAL CLAVICLE EXCISION,  SUBACROMIAL DECOMPRESSION AND POSSIBLE ROTATOR CUFF REPAIR;  Surgeon: [name], MD;  Location: [facility];  Service: Orthopedics;  Laterality: Left;  . TEE WITHOUT CARDIOVERSION N/A 04/06/2012   Procedure: TRANSESOPHAGEAL ECHOCARDIOGRAM (TEE);  Surgeon: [name], MD;  Location: [facility];  Service: Cardiovascular;  Laterality: N/A;  . TRIGGER FINGER RELEASE Right    middle finger  . TRIGGER FINGER RELEASE Left 06/28/2016   Procedure: RELEASE TRIGGER FINGER LEFT 3RD FINGER;  Surgeon: [name], MD;  Location: [facility];  Service: Orthopedics;  Laterality: Left;  . TRIGGER FINGER RELEASE Right 06/12/2016   Procedure: RIGHT INDEX FINGER TRIGGER RELEASE;  Surgeon: [name], MD;  Location: [facility];  Service: Orthopedics;  Laterality: Right;  . TRIGGER FINGER RELEASE Right 01/19/2018   Procedure: RIGHT RING FINGER TRIGGER FINGER RELEASE;  Surgeon: [name], MD;  Location: [facility];  Service: Orthopedics;  Laterality: Right;  . TRIGGER FINGER RELEASE Left 05/07/2019   Procedure: RELEASE TRIGGER FINGER LEFT INDEX FINGER;  Surgeon: [name], MD;  Location: [facility];  Service: Orthopedics;  Laterality: Left;    There were no  vitals filed for this visit.  Subjective Assessment - 06/22/19 1134    Subjective  Had to have knee injection this AM.  I missed my appt last week.  I want to work on raising my arm.    Currently in Pain?  Yes    Pain Score  10-Worst pain ever    Pain Location  Shoulder    Pain Orientation  Left    Pain Score  10    Pain Location  Hip         [facility] PT Assessment - 06/22/19 0001      AROM   Left Shoulder Flexion  138 Degrees          [facility] Adult PT Treatment/Exercise - 06/22/19 0001  Shoulder Exercises: Seated   Elevation  AAROM;Strengthening;Both;15 reps    Extension  Strengthening;Both;15 reps;Theraband    Theraband Level (Shoulder Extension)  Level 3 (Green)    Row  Strengthening;Both;15 reps;Theraband    Theraband Level (Shoulder Row)  Level 3 (Green)    Horizontal ABduction  AAROM;Both;10 reps    External Rotation  AAROM;Left;10 reps    Flexion  AAROM;Strengthening;Both;15 reps    Flexion Weight (lbs)  chest press x 10       Shoulder Exercises: Pulleys   Flexion  3 minutes      Shoulder Exercises: ROM/Strengthening   Other ROM/Strengthening Exercises  seated table slides flexion x 10     Other ROM/Strengthening Exercises  seated ball roll with lift off x 10 (flexion ), ball press (extension ) x 10       Shoulder Exercises: Isometric Strengthening   Extension  5X10"    Extension Limitations  ball press     External Rotation  5X10"    Internal Rotation  5X10"    ABduction  5X10"               PT Short Term Goals - 06/22/19 1208      PT SHORT TERM GOAL #1   Title  Pt will be I with HEP for strength and ROM within limits of surgical protocol    Baseline  contniues to develop HEP, began isometrics and genttle strength today    Status  On-going      PT SHORT TERM GOAL #2   Title  Pt will be able to improve transfers in the clinic with mod I (stand-pivot and bed mobility)    Baseline  min A to supervision due to knee pain    Status  On-going       PT SHORT TERM GOAL #3   Title  Pt will be able to use L UE to assist with ADLs below shoulder height with min increase in pain    Baseline  using arm for lower body dressing as tolerance    Status  On-going      PT SHORT TERM GOAL #4   Title  Pt will improve ability to make a fist with improved comfort, limited in finger flexion 50%    Status  Deferred        PT Long Term Goals - 05/24/19 1718      PT LONG TERM GOAL #1   Title  Pt will be I with more advanced HEP for L shoulder ROM and strength    Time  8    Period  Weeks    Status  New    Target Date  07/19/19      PT LONG TERM GOAL #2   Title  Pt willl be able to increase L UE strength in shoulder flexion and abduction to 4/5 for mobility tasks.    Baseline  2+/5 estimated    Time  8    Period  Weeks    Status  New    Target Date  07/19/19      PT LONG TERM GOAL #3   Title  Pt will be able to walk with walker and min shoulder pain short distances (300 feet)    Baseline  uses walker    Time  8    Period  Weeks    Status  New    Target Date  07/19/19      PT LONG TERM GOAL #4   Title  Pt will demonstrate full AROM in sitting in functional reach (ER and IR) with min discomfort    Baseline  passive ROM in ER 70 deg and IR 70 deg (did not measure functional reach due to protocol)    Period  Weeks    Status  New    Target Date  07/19/19      PT LONG TERM GOAL #5   Title  FOTO score TBA            Plan - 06/22/19 1136    Clinical Impression Statement  Used wheelchair due to knee and hip pain. Patient is 6 weeks out from L rotator cuff surgery  She has severe pain today "all over" but able to perform seated shoulder ROM and light strength with good effort.    Examination-Activity Limitations  Bed Mobility;Reach Overhead;Locomotion Level;Transfers;Lift;Hygiene/Grooming;Toileting;Stand;Dressing;Stairs;Squat;Carry;Bend    PT Treatment/Interventions  ADLs/Self Care Home Management;Patient/family education;Functional  mobility training;Therapeutic activities;Moist Heat;Therapeutic exercise;Cryotherapy;Electrical Stimulation;Manual techniques;Neuromuscular re-education;Taping;Passive range of motion    PT Next Visit Plan  AAROM, isometrics, sit to stand    PT Home Exercise Plan  table slides, pendulums and scap squeeze, added isometrics and supine cane pressups and pullovers    Consulted and Agree with Plan of Care  Patient       Patient will benefit from skilled therapeutic intervention in order to improve the following deficits and impairments:  Abnormal gait, Decreased range of motion, Difficulty walking, Obesity, Impaired UE functional use, Decreased endurance, Cardiopulmonary status limiting activity, Decreased activity tolerance, Pain, Impaired flexibility, Decreased balance, Decreased strength, Decreased mobility, Postural dysfunction  Visit Diagnosis: Muscle weakness (generalized)  Acute pain of left shoulder  S/P left rotator cuff repair  Other symptoms and signs involving the musculoskeletal system     Problem List Patient Active Problem List   Diagnosis Date Noted  . Body mass index 50.0-59.9, adult ([place]) 06/12/2019  . Constipation due to outlet dysfunction   . Impingement syndrome of left shoulder 05/07/2019  . Polyp of colon   . Polyp of ascending colon   . Rectal discomfort 02/12/2019  . Osteoarthritis of right hip 02/12/2019  . Nontraumatic tear of left supraspinatus tendon 12/30/2018  . Gout 12/17/2018  . Nontraumatic complete tear of right rotator cuff 12/08/2018  . Rotator cuff tear, right 12/01/2018  . Chest pain 09/24/2018  . Right groin pain 07/20/2018  . Depressed mood 07/13/2018  . Mouth pain 06/02/2018  . Paroxysmal SVT (supraventricular tachycardia) ([place]) 03/20/2018  . Pure hypercholesterolemia 03/20/2018  . Muscle cramping 02/25/2018  . Nodule of upper lobe of right lung 02/13/2018  . Falls, subsequent encounter 01/30/2018  . Pain in left foot 11/04/2017  .  Open wound of left foot 11/04/2017  . Essential hypertension 10/27/2017  . Solitary pulmonary nodule 10/07/2017  . Restrictive lung disease secondary to obesity 10/01/2017  . Polycythemia, secondary 10/01/2017  . Chronic viral hepatitis B without delta-agent ([place]) 07/08/2017  . COPD (chronic obstructive pulmonary disease) with chronic bronchitis ([place]) 06/27/2017  . Chronic kidney disease (CKD), stage IV (severe) ([place]) 01/14/2017  . Type 2 diabetes mellitus with stage 3 chronic kidney disease, with long-term current use of insulin ([place]) 12/12/2016  . Urge incontinence of urine 12/05/2016  . Trigger finger, left index finger 07/15/2016  . Back pain 04/07/2014  . Morbid obesity ([place]) 10/02/2012  . Chronic diastolic CHF (congestive heart failure) ([place]) 04/07/2012  . GERD 01/26/2010  . Obstructive sleep apnea treated with BiPAP 02/03/2008  . FIBROCYSTIC BREAST DISEASE 10/28/2006  .  Hyperlipidemia 03/27/2006  . Obesity hypoventilation syndrome ([place]) 03/27/2006  . Former tobacco use 03/27/2006  . Depression with anxiety 03/27/2006    PAA,JENNIFER 06/22/2019, 12:36 PM  [place], [place], [address] Phone: [phone]   Fax:  [phone]  Name: KALEESI GUYTON MRN: [id number] Date of Birth: 06/12/1965  Raeford Razor, PT 06/22/19 12:36 PM Phone: [phone] Fax: [phone]

## 2019-06-22 NOTE — Progress Notes (Signed)
Office Visit Note   Patient: [name]           Date of Birth: 1965-03-16           MRN: [id number] Visit Date: 06/22/2019              Requested by: [name], MD [address],  [place] [address] PCP: [name], MD   Assessment & Plan: Visit Diagnoses:  1. Nontraumatic complete tear of right rotator cuff   2. Primary osteoarthritis of left knee   3. Trigger finger, left index finger     Plan: Impression is 6 weeks status post left rotator cuff repair.  Doing well.  Continue with PT per protocol.  For the left knee cortisone injection performed today.  Questions encouraged and answered.  Follow-up in 6 weeks to recheck the left shoulder.  Follow-Up Instructions: Return in about 6 weeks (around 08/03/2019).   Orders:  No orders of the defined types were placed in this encounter.  No orders of the defined types were placed in this encounter.     Procedures: Large Joint Inj: L knee on 06/22/2019 8:17 AM Details: 22 G needle Medications: 2 mL bupivacaine 0.5 %; 2 mL lidocaine 1 %; 40 mg methylPREDNISolone acetate 40 MG/ML Outcome: tolerated well, no immediate complications Patient was prepped and draped in the usual sterile fashion.       Clinical Data: No additional findings.   Subjective: Chief Complaint  Patient presents with  . Left Shoulder - Pain, Follow-up    [name] is 6 weeks status post left rotator cuff repair.  Overall doing well and doing physical therapy twice a week.  She is able to raise her arm to the level of the shoulder without pain.  She is also complaining of a separate issue today regarding her left knee which is causing her to have pain and buckling.  Denies any swelling.  Her left index trigger finger release is stable.   Review of Systems  Constitutional: Negative.   HENT: Negative.   Eyes: Negative.   Respiratory: Negative.   Cardiovascular: Negative.   Endocrine: Negative.   Musculoskeletal: Negative.     Neurological: Negative.   Hematological: Negative.   Psychiatric/Behavioral: Negative.   All other systems reviewed and are negative.    Objective: Vital Signs: LMP 04/10/2006   Physical Exam Vitals and nursing note reviewed.  Constitutional:      Appearance: She is well-developed.  HENT:     Head: Normocephalic and atraumatic.  Pulmonary:     Effort: Pulmonary effort is normal.  Abdominal:     Palpations: Abdomen is soft.  Musculoskeletal:     Cervical back: Neck supple.  Skin:    General: Skin is warm.     Capillary Refill: Capillary refill takes less than 2 seconds.  Neurological:     Mental Status: She is alert and oriented to person, place, and time.  Psychiatric:        Behavior: Behavior normal.        Thought Content: Thought content normal.        Judgment: Judgment normal.     Ortho Exam Left shoulder shows fully healed surgical scars.  Active and passive range of motion to about 90 degrees Left knee shows no joint effusion.  Good range of motion with patellofemoral crepitus. Specialty Comments:  No specialty comments available.  Imaging: No results found.   PMFS History: Patient Active Problem List   Diagnosis Date  Noted  . Body mass index 50.0-59.9, adult ([place]) 06/12/2019  . Constipation due to outlet dysfunction   . Impingement syndrome of left shoulder 05/07/2019  . Polyp of colon   . Polyp of ascending colon   . Rectal discomfort 02/12/2019  . Osteoarthritis of right hip 02/12/2019  . Nontraumatic tear of left supraspinatus tendon 12/30/2018  . Gout 12/17/2018  . Nontraumatic complete tear of right rotator cuff 12/08/2018  . Rotator cuff tear, right 12/01/2018  . Chest pain 09/24/2018  . Right groin pain 07/20/2018  . Depressed mood 07/13/2018  . Mouth pain 06/02/2018  . Paroxysmal SVT (supraventricular tachycardia) ([place]) 03/20/2018  . Pure hypercholesterolemia 03/20/2018  . Muscle cramping 02/25/2018  . Nodule of upper lobe of right  lung 02/13/2018  . Falls, subsequent encounter 01/30/2018  . Pain in left foot 11/04/2017  . Open wound of left foot 11/04/2017  . Essential hypertension 10/27/2017  . Solitary pulmonary nodule 10/07/2017  . Restrictive lung disease secondary to obesity 10/01/2017  . Polycythemia, secondary 10/01/2017  . Chronic viral hepatitis B without delta-agent ([place]) 07/08/2017  . COPD (chronic obstructive pulmonary disease) with chronic bronchitis ([place]) 06/27/2017  . Chronic kidney disease (CKD), stage IV (severe) ([place]) 01/14/2017  . Type 2 diabetes mellitus with stage 3 chronic kidney disease, with long-term current use of insulin ([place]) 12/12/2016  . Urge incontinence of urine 12/05/2016  . Trigger finger, left index finger 07/15/2016  . Back pain 04/07/2014  . Morbid obesity ([place]) 10/02/2012  . Chronic diastolic CHF (congestive heart failure) ([place]) 04/07/2012  . GERD 01/26/2010  . Obstructive sleep apnea treated with BiPAP 02/03/2008  . FIBROCYSTIC BREAST DISEASE 10/28/2006  . Hyperlipidemia 03/27/2006  . Obesity hypoventilation syndrome ([place]) 03/27/2006  . Former tobacco use 03/27/2006  . Depression with anxiety 03/27/2006   Past Medical History:  Diagnosis Date  . Alcoholism ([place])   . Anxiety   . Arthritis 04-10-11   hips, shoulders, back  . Asthma   . Bipolar disorder ([place])   . Cardiomegaly   . Cervical cancer ([name]) 1993   cervical, no treatment done, went away per pt  . Cervical dysplasia or atypia 04-10-11   '93- once dx.-got pregnant-no intervention, then postpartum, no dysplasia found  . CHF (congestive heart failure) ([place])    no cardiologist 2014 dx, none now  . Chronic hypoxemic respiratory failure ([place])   . Chronic kidney disease    stage 3 kidney disease  . 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 Pathology showed Condyloma, gluteal CONDYLOMA ACUMINATUM.   [name] Kitchen COPD (chronic obstructive pulmonary disease) ([place])   . Depression   . Diabetes mellitus  without complication ([place])   . Dyspnea    with actity, sitting  . Fall 12/16/2016  . Fibrocystic breast disease   . GERD (gastroesophageal reflux disease)   . Grade I diastolic dysfunction 96/78/9381   Noted on ECHO  . Hepatitis    hep B-count is low at present,doesn't register  . History of PSVT (paroxysmal supraventricular tachycardia)   . Hypercalcemia   . Hyperlipidemia   . Hypertension   . Incomplete left bundle branch block (LBBB) 09/24/2018   Noted on EKG  . Lung nodule 11/2017   right and left lung  . Migraine headache    none recent  . Morbid obesity ([place]) 03/27/2006  . Neuropathy   . Pneumonia    walking pneu 15 yrs. ago  . Skin lesion 03/15/2011   In gluteal crease now s/p  removal by Dr. [name] of General Surgery on 3/12. Path shows condyloma.     . Sleep apnea 04-10-11   uses cpap, pt does not know settings  . Trigger finger    left third  . Urge incontinence of urine     Family History  Problem Relation Age of Onset  . Pulmonary embolism Mother   . Stroke Father   . Alcohol abuse Father   . Pneumonia Father   . Hypertension Father   . Breast cancer Maternal Aunt   . Hypertension Other   . Diabetes Other        Aunts and cousins  . Asthma Sister   . Depression Sister   . Arthritis Sister   . Sickle cell anemia Son     Past Surgical History:  Procedure Laterality Date  . ANAL FISTULECTOMY  04/17/2011   Procedure: FISTULECTOMY ANAL;  Surgeon: Imogene Burn. [name], MD;  [facility];  Service: General;  Laterality: N/A;  Excision of Condyloma Gluteal Cleft   . ANAL RECTAL MANOMETRY N/A 03/26/2019   Procedure: ANO RECTAL MANOMETRY;  Surgeon: Thornton Park, MD;  [facility];  Service: Gastroenterology;  Laterality: N/A;  . BIOPSY  03/09/2019   Procedure: BIOPSY;  Surgeon: Thornton Park, MD;  [facility];  Service: Gastroenterology;;  . Lum [place] INJECTION N/A 12/29/2018   Procedure: BOTOX INJECTION(100 UNITS)  WITH CYSTOSCOPY;  Surgeon:  Ceasar Mons, MD;  [facility];  Service: Urology;  Laterality: N/A;  . BREAST CYST EXCISION     bilateral breast, 3 cysts removed from each breast  . BREAST EXCISIONAL BIOPSY Right    x 3  . BREAST EXCISIONAL BIOPSY Left    x 3  . CARPAL TUNNEL RELEASE Bilateral   . Cervical biospy    . CERVICAL FUSION  04-10-11   x3- cervical fusion with plating and screws-Dr. [name]  . COLONOSCOPY WITH PROPOFOL N/A 07/06/2015   Procedure: COLONOSCOPY WITH PROPOFOL;  Surgeon: [name], MD;  [facility];  Service: Endoscopy;  Laterality: N/A;  . COLONOSCOPY WITH PROPOFOL N/A 03/09/2019   Procedure: COLONOSCOPY WITH PROPOFOL;  Surgeon: Thornton Park, MD;  [facility];  Service: Gastroenterology;  Laterality: N/A;  . DOPPLER ECHOCARDIOGRAPHY  04/06/2012   AT [place] 55-60%  . FOOT SURGERY Left 2018  . FRACTURE SURGERY     little toe left foot  . I & D EXTREMITY Left 12/18/2016   Procedure: IRRIGATION AND DEBRIDEMENT LEFT FOOT, CLOSURE;  Surgeon: [name], MD;  Location: [facility];  Service: Orthopedics;  Laterality: Left;  . [place] SURGERY  04-10-11   x5-Lumbar fusion-retained hardware.(Dr. Louanne Skye)  . MULTIPLE TOOTH EXTRACTIONS    . POLYPECTOMY  03/09/2019   Procedure: POLYPECTOMY;  Surgeon: Thornton Park, MD;  [facility];  Service: Gastroenterology;;  . SHOULDER ARTHROSCOPY WITH ROTATOR CUFF REPAIR AND SUBACROMIAL DECOMPRESSION Left 05/07/2019   Procedure: LEFT SHOULDER ARTHROSCOPY WITH DEBRIDEMENT, DISTAL CLAVICLE EXCISION,  SUBACROMIAL DECOMPRESSION AND POSSIBLE ROTATOR CUFF REPAIR;  Surgeon: [name], MD;  Location: [facility];  Service: Orthopedics;  Laterality: Left;  . TEE WITHOUT CARDIOVERSION N/A 04/06/2012   Procedure: TRANSESOPHAGEAL ECHOCARDIOGRAM (TEE);  Surgeon: [name], MD;  Location: [facility] ENDOSCOPY;  Service: Cardiovascular;  Laterality: N/A;  . TRIGGER FINGER RELEASE Right    middle finger  . TRIGGER FINGER  RELEASE Left 06/28/2016   Procedure: RELEASE TRIGGER FINGER LEFT 3RD FINGER;  Surgeon: [name], MD;  Location: [facility];  Service: Orthopedics;  Laterality: Left;  . TRIGGER FINGER RELEASE Right 06/12/2016   Procedure: RIGHT INDEX FINGER TRIGGER RELEASE;  Surgeon: [name], MD;  Location: [facility];  Service: Orthopedics;  Laterality: Right;  . TRIGGER FINGER RELEASE Right 01/19/2018   Procedure: RIGHT RING FINGER TRIGGER FINGER RELEASE;  Surgeon: [name], MD;  Location: [facility];  Service: Orthopedics;  Laterality: Right;  . TRIGGER FINGER RELEASE Left 05/07/2019   Procedure: RELEASE TRIGGER FINGER LEFT INDEX FINGER;  Surgeon: [name], MD;  Location: [facility];  Service: Orthopedics;  Laterality: Left;   Social History   Occupational History  . Occupation: disabled    Comment: Disabled  Tobacco Use  . Smoking status: Current Every Day Smoker    Packs/day: 0.20    Years: 40.00    Pack years: 8.00    Types: Cigarettes    Start date: 01/28/1977  . Smokeless tobacco: Never Used  . Tobacco comment: 4-5 cigarettes per day  Substance and Sexual Activity  . Alcohol use: No    Alcohol/week: 0.0 standard drinks  . Drug use: Not Currently    Types: Marijuana, "Crack" cocaine    Comment: hx marijuana usemany years ago, Crack -none since 11/2015  . Sexual activity: Not Currently    Partners: Male    Birth control/protection: Post-menopausal

## 2019-06-22 NOTE — Chronic Care Management (AMB) (Addendum)
Clinical Social Work  Care Management Outreach   1/61/0960 Name: [name] MRN: [id number] DOB: [birth year] [name] is a 54 y.o. year old female who is a primary care patient of Mary Shave, MD .  LCSW was consulted by PCP via in-basket message for assistance with Level of Care Concerns [name] for patient.   LCSW reached out to [name] today by phone to introduce self, assess needs and offer Care Management services and interventions.    Review of patient status, including review of consultants reports, relevant laboratory and other test results, and collaboration with appropriate care team members and the patient's provider was performed as part of comprehensive patient evaluation and provision of care management services.   Goals Addressed            This Visit's Progress   . Personal Care Services       CARE PLAN ENTRY (see longitudinal plan of care for additional care plan information)  Current Barriers:   . Patient needs Support, Education, and Care Coordination to resolve unmet Personal Care needs . Patient needs PCP to complete [name] Referral Clinical Social Work Goal(s):  [name] Kitchen Over the next 30 to 45 days, patient will have personal care needs met as evident by having [name] Aide in the home assisting with needs. . Over the next 30 days patient will work with LCSW, and [name] to coordinate care for [name] and select a personal care service provider  Interventions provided by LCSW : . Assessed needs, level of care concerns, basic eligibility and provided education on Personal Care Service process,  . Collaborate with primary care provider ref completing [name] referral ( Referral placed in PCP's mailbox 06/22/19) . [name] referral will be faxed to [name] at [phone] once completed and signed by PCP . LCSW will collaborate with [name] to verify application is received and processed.  Patient Self  Care Activities & Deficits:  . Patient is unable to perform ADLs independently without assistance  . Family/support system will assist patient with meeting needs until [name] Osborne is approved . Return calls from [name] to set up initial [name] assessment once application is approved . Call [facility] with questions [phone] or [phone]  Initial goal documentation    Follow Up Plan:  LCSW will collaborate with PCP and [name], [name]   [phone] 10:15 AM

## 2019-06-23 ENCOUNTER — Ambulatory Visit: Payer: Self-pay | Admitting: Licensed Clinical Social Worker

## 2019-06-23 DIAGNOSIS — Z741 Need for assistance with personal care: Secondary | ICD-10-CM

## 2019-06-23 NOTE — Chronic Care Management (AMB) (Signed)
Care Management   Clinical Social Work Follow Up   5/40/0867 Name: [name] MRN: [id number] DOB: 05-02-1965  Referred by: [name], MD  Reason for referral : Care Coordination (personal care services)  [name] is a 54 y.o. year old female who is a primary care patient of [name], MD.   Reason for follow-up: Phone encounter with patient today to inform her the [facility] referral has been completed and faxed.  Patient appreciative of the assistance. Informed LCSW she can take care of everything going forward. No F/U needed.  Review of patient status, including review of consultants reports, relevant laboratory and other test results, and collaboration with appropriate care team members and the patient's provider was performed as part of comprehensive patient evaluation and provision of care management services.     Goals Addressed            This Visit's Progress   . Personal Care Services   On track    CARE PLAN ENTRY (see longitudinal plan of care for additional care plan information)  Current Barriers & progress:   . Patient needs Support, Education, and Care Coordination to resolve unmet Personal Care needs . [name] Referral completed and faxed to [place] Work Goal(s):  [name] Kitchen Over the next 30 to 45 days, patient will have personal care needs met as evident by having [name] Aide in the home assisting with needs. Interventions provided by LCSW : . Assessed needs, level of care concerns, basic eligibility and provided education on Personal Care Service process,  . Collaborate with primary care provider ref  [name] referral  . [name] referral faxed to [name] at [phone]  Patient Self Care Activities & Deficits:  . Patient is unable to perform ADLs independently without assistance  . Family/support system will assist patient with meeting needs until [facility] is approved . Patient states she will call [facility]  with questions [phone] or [phone] and will contact LCSW if needed Please see past updates related to this goal by clicking on the "Past Updates" button in the selected goal       Outpatient Encounter Medications as of 06/23/2019  Medication Sig  . loratadine (CLARITIN) 10 MG tablet TAKE 1 TABLET BY MOUTH EVERY DAY  . Accu-Chek Softclix Lancets lancets TEST 4 TIMES DAILY  . acetaminophen (TYLENOL) 500 MG tablet Take 1,000 mg by mouth 2 (two) times daily.   [name] Kitchen albuterol (VENTOLIN HFA) 108 (90 Base) MCG/ACT inhaler Inhale 2 puffs into the lungs every 6 (six) hours as needed for wheezing or shortness of breath.  [name] Kitchen ammonium lactate (LAC-HYDRIN) 12 % lotion APPLY TWICE A DAY AS NEEDED FOR DRY SKIN  . atorvastatin (LIPITOR) 40 MG tablet TAKE 1 TABLET BY MOUTH EVERY DAY (Patient taking differently: Take 40 mg by mouth daily. )  . b complex vitamins capsule Take 1 capsule by mouth daily.  . baclofen (LIORESAL) 10 MG tablet Take 1 tablet (10 mg total) by mouth 3 (three) times daily as needed for muscle spasms. (Patient taking differently: Take 10 mg by mouth 3 (three) times daily. )  . blood glucose meter kit and supplies KIT Dispense based on patient and insurance preference. Use up to four times daily as directed. (FOR ICD-9 250.00, 250.01).  . busPIRone (BUSPAR) 10 MG tablet TAKE 1 TABLET BY MOUTH THREE TIMES A DAY (Patient taking differently: Take 10 mg by mouth 3 (three) times daily. )  . diclofenac Sodium (VOLTAREN) 1 %  GEL APPLY 2 GRAMS TO AFFECTED AREA 4 TIMES A DAY  . diltiazem (CARDIZEM CD) 120 MG 24 hr capsule Take 1 capsule (120 mg total) by mouth 2 (two) times daily.  [name] Kitchen docusate sodium (COLACE) 100 MG capsule Take 200 mg by mouth 2 (two) times daily.  . empagliflozin (JARDIANCE) 25 MG TABS tablet Take 25 mg by mouth daily.  . febuxostat (ULORIC) 40 MG tablet Take 40 mg by mouth at bedtime.   . fluticasone (FLONASE) 50 MCG/ACT nasal spray Place 2 sprays into both nostrils 2 (two) times  daily.  . Fluticasone-Umeclidin-Vilant (TRELEGY ELLIPTA) 100-62.5-25 MCG/INH AEPB Inhale 1 puff into the lungs daily.  . folic acid (FOLVITE) 734 MCG tablet Take 400 mcg by mouth daily.  . furosemide (LASIX) 40 MG tablet TAKE 2 TABLETS (80 MG TOTAL) BY MOUTH 2 (TWO) TIMES DAILY.  [name] Kitchen gabapentin (NEURONTIN) 300 MG capsule TAKE 2 CAPSULES (600 MG TOTAL) BY MOUTH 3 (THREE) TIMES DAILY.  [name] Kitchen glucose blood (ACCU-CHEK AVIVA PLUS) test strip 1 each by Other route 3 (three) times daily. TEST 3 TIMES DAILY  . HYDROcodone-acetaminophen (NORCO) 7.5-325 MG tablet Take 1 tablet by mouth every 6 (six) hours as needed for moderate pain. (Patient taking differently: Take 1 tablet by mouth in the morning, at noon, in the evening, and at bedtime. )  . hydrOXYzine (ATARAX/VISTARIL) 10 MG tablet TAKE 1 TABLET BY MOUTH EVERY DAY (Patient taking differently: Take 10 mg by mouth daily. )  . insulin glargine (LANTUS SOLOSTAR) 100 UNIT/ML Solostar Pen Inject 20 Units into the skin daily before breakfast.  . insulin lispro (HUMALOG KWIKPEN) 100 UNIT/ML KwikPen Inject 0.08 mLs (8 Units total) into the skin 3 (three) times daily before meals.  . Insulin Pen Needle (B-D UF III MINI PEN NEEDLES) 31G X 5 MM MISC CHECK SUGARS 4 TIMES A DAY BEFORE MEALS AND AT BEDTIME.  [name] Kitchen ketoconazole (NIZORAL) 2 % cream APPLY 1 FINGERTIP AMOUNT TO EACH FOOT DAILY.  [name] Kitchen losartan (COZAAR) 100 MG tablet Take 1 tablet (100 mg total) by mouth daily.  . metolazone (ZAROXOLYN) 2.5 MG tablet Take 1 tablet (2.5 mg total) by mouth 2 (two) times a week. TAKE 1 TABLET TWICE WEEKLY ON MONDAYS AND THURSDAYS ONLY (Patient taking differently: Take 2.5 mg by mouth See admin instructions. TAKE 2.5 BY MOUTH  TWICE WEEKLY ON MONDAYS AND THURSDAYS ONLY)  . montelukast (SINGULAIR) 10 MG tablet TAKE 1 TABLET BY MOUTH EVERYDAY AT BEDTIME (Patient taking differently: Take 10 mg by mouth at bedtime. )  . Multiple Vitamins-Minerals (MULTIVITAMIN WITH MINERALS) tablet Take 1 tablet  by mouth daily.  . mupirocin ointment (BACTROBAN) 2 % APPLY TO AFFECTED AREA TWICE A DAY (Patient taking differently: Apply 1 application topically 2 (two) times daily as needed (skin fold (stomach)). )  . NARCAN 4 MG/0.1ML LIQD nasal spray kit Place 1 spray into the nose as needed (accidental overdose).   . nicotine polacrilex (NICORETTE) 4 MG gum CHEW 1 EACH (4 MG TOTAL) BY MOUTH AS NEEDED FOR SMOKING CESSATION. (Patient taking differently: Take 4 mg by mouth as needed for smoking cessation. )  . NON FORMULARY Uses a C-PAP at bedtime  . nystatin (MYCOSTATIN) 100000 UNIT/ML suspension Take 5 mLs (500,000 Units total) by mouth 4 (four) times daily. Swish and swallow. (Patient taking differently: Take 5 mLs by mouth 4 (four) times daily as needed (dry mouth). Swish and swallow.)  . Omega-3 Fatty Acids (FISH OIL) 1000 MG CAPS Take 1,000 mg by mouth daily.   [name] Kitchen  omeprazole (PRILOSEC) 20 MG capsule Take 1 capsule (20 mg total) by mouth daily.  . ondansetron (ZOFRAN) 4 MG tablet Take 1-2 tablets (4-8 mg total) by mouth every 8 (eight) hours as needed for nausea or vomiting.  [name] Kitchen oxyCODONE-acetaminophen (PERCOCET) 5-325 MG tablet Take 1-2 tablets by mouth every 8 (eight) hours as needed for severe pain.  . OXYGEN Inhale 2 L into the lungs continuous.   . potassium chloride SA (KLOR-CON M20) 20 MEQ tablet Take 1 tablet (20 mEq total) by mouth 2 (two) times a week. Take 1 tablet twice weekly on Monday and Thursday. (Patient taking differently: Take 20 mEq by mouth See admin instructions. Take 20 mEq by mouth twice weekly on Monday and Thursday.)  . promethazine (PHENERGAN) 25 MG tablet Take 1 tablet (25 mg total) by mouth every 6 (six) hours as needed for nausea.  . sertraline (ZOLOFT) 100 MG tablet Take 1 tablet (100 mg total) by mouth daily.  . varenicline (CHANTIX CONTINUING MONTH PAK) 1 MG tablet Take 1 tablet (1 mg total) by mouth 2 (two) times daily.  . [DISCONTINUED] atorvastatin (LIPITOR) 40 MG tablet  Take 1 tablet (40 mg total) by mouth daily.  . [DISCONTINUED] CHANTIX 0.5 MG tablet TAKE 1 TABLET (0.5 MG TOTAL) BY MOUTH 2 (TWO) TIMES DAILY.  . [DISCONTINUED] diclofenac Sodium (VOLTAREN) 1 % GEL Apply 2 g topically 4 (four) times daily.  . [DISCONTINUED] loratadine (CLARITIN) 10 MG tablet TAKE 1 TABLET BY MOUTH EVERY DAY (Patient taking differently: Take 10 mg by mouth daily. )  . [DISCONTINUED] nicotine polacrilex ([employer] NICOTINE POLACRILEX) 4 MG gum Take 1 each (4 mg total) by mouth as needed for smoking cessation.   Facility-Administered Encounter Medications as of 06/23/2019  Medication  . technetium tetrofosmin (TC-MYOVIEW) injection 40.3 millicurie   Plan: Patient will call office if needed.  No F/U scheduled  [name], [place] / [place]   [phone] 2:44 PM

## 2019-06-24 ENCOUNTER — Ambulatory Visit: Payer: Medicare Other | Admitting: Physical Therapy

## 2019-06-24 DIAGNOSIS — M25511 Pain in right shoulder: Secondary | ICD-10-CM | POA: Diagnosis not present

## 2019-06-24 DIAGNOSIS — Z79899 Other long term (current) drug therapy: Secondary | ICD-10-CM | POA: Diagnosis not present

## 2019-06-24 DIAGNOSIS — M25512 Pain in left shoulder: Secondary | ICD-10-CM | POA: Diagnosis not present

## 2019-06-24 DIAGNOSIS — G894 Chronic pain syndrome: Secondary | ICD-10-CM | POA: Diagnosis not present

## 2019-07-02 ENCOUNTER — Other Ambulatory Visit: Payer: Self-pay

## 2019-07-02 ENCOUNTER — Encounter: Payer: Self-pay | Admitting: Specialist

## 2019-07-02 ENCOUNTER — Ambulatory Visit (INDEPENDENT_AMBULATORY_CARE_PROVIDER_SITE_OTHER): Payer: Medicare Other | Admitting: Specialist

## 2019-07-02 ENCOUNTER — Ambulatory Visit: Payer: Self-pay

## 2019-07-02 VITALS — BP 131/80 | HR 83 | Ht 70.0 in | Wt 368.0 lb

## 2019-07-02 DIAGNOSIS — S335XXA Sprain of ligaments of lumbar spine, initial encounter: Secondary | ICD-10-CM

## 2019-07-02 DIAGNOSIS — Z981 Arthrodesis status: Secondary | ICD-10-CM | POA: Diagnosis not present

## 2019-07-02 DIAGNOSIS — Z6841 Body Mass Index (BMI) 40.0 and over, adult: Secondary | ICD-10-CM | POA: Diagnosis not present

## 2019-07-02 DIAGNOSIS — M48062 Spinal stenosis, lumbar region with neurogenic claudication: Secondary | ICD-10-CM | POA: Diagnosis not present

## 2019-07-02 MED ORDER — TIZANIDINE HCL 4 MG PO TABS: 4.0000 mg | ORAL_TABLET | Freq: Three times a day (TID) | ORAL | 0 refills | Status: DC | PRN

## 2019-07-02 NOTE — Progress Notes (Signed)
Office Visit Note   Patient: [name]           Date of Birth: 1965/05/17           MRN: [id number] Visit Date: 07/02/2019              Requested by: [name], MD [address],  [place] [address] PCP: [name], MD   Assessment & Plan: Visit Diagnoses:  1. S/P lumbar fusion   2. Morbid obesity ([place])   3. Body mass index 50.0-59.9, adult ([place])   4. Spinal stenosis of lumbar region with neurogenic claudication   5. Lumbar back sprain, initial encounter     Plan:Avoid frequent bending and stooping  No lifting greater than 10 lbs. May use ice or moist heat for pain. Weight loss is of benefit. You should not take NSAIDs due to kidney disease and diabetes.  Exercise is important to improve your indurance and does allow people to function better inspite of back pain. Ice the area of discomfort and you can use a heating pad intermittantly, Be careful as the pad may burn you If it remains on the skin too long.  Meds for pain control through pain management. Will ask Home Health to go to your house to work with mobilizing and treating this acute lumbar Sprain/Strain.  Plan: Knee is suffering from osteoarthritis, only real proven treatments are Weight loss and exercise. Well padded shoes help. Ice the knee that is suffering from osteoarthritis, only real proven treatments are  Well padded shoes help. Ice the knee 2-3 times a day 15-20 mins at a time.-3 times a day 15-20 mins at a time. Hot showers in the AM.  Injection with steroid may be of benefit. Hemp CBD capsules, amazon.com 5,000-7,000 mg per bottle, 60 capsules per bottle, take one capsule twice a day. Cane in the left hand to use with left leg weight bearing. Follow-Up Instructions: No follow-ups on file.   Follow-Up Instructions: Return in about 4 weeks (around 07/30/2019).   Orders:  Orders Placed This Encounter  Procedures  . XR Lumbar Spine 2-3 Views   No orders of the defined types  were placed in this encounter.     Procedures: No procedures performed   Clinical Data: No additional findings.   Subjective: Chief Complaint  Patient presents with  . Lower Back - Pain    54 year old female with history of lumbar spinal stenosis above previous lumbar fusion. She has pain in the lower back left side at the bottom with pain  Like electricity in the left buttock and goes down the back of the left leg to the knee. No bowel or bladder difficulty. She has  Pain with going to stand and walk and pain with just sitting still. Lying still she has pain. She is down to 364 from 417lbs and she reports she has one hundred more lbs to go and she is doing this weight loss on her own. She is on insulin, sugars are good 187 this AM, HgbA1c was  8.4 and this was following right hip steroid injectionn 4/14 and 4/25 had injection in the left knee. She reports that she had a new mattress  Placed on her been and had to jump to get up on the bed and in the process she reports that she injured her back. She has to jump forward and hop the right leg up she throws the right leg up first then crawls the left  leg up to get onto the bed. She reports they were bought new and a new stair was delivered this Monday 4 days ago. He had left shoulder surgery by Dr. Erlinda Hong this past. She is in pain management and she is interested in knowing what she did to her back.   Review of Systems  Constitutional: Negative.   HENT: Negative.   Eyes: Negative.   Respiratory: Negative.   Cardiovascular: Negative.   Gastrointestinal: Negative.   Endocrine: Negative.   Genitourinary: Negative.   Musculoskeletal: Negative.   Skin: Negative.   Allergic/Immunologic: Negative.   Neurological: Negative.   Hematological: Negative.   Psychiatric/Behavioral: Negative.      Objective: Vital Signs: BP 131/80 (BP Location: Left Arm, Patient Position: Sitting)   Pulse 83   Ht 5\' 10"  (1.778 m)   Wt (!) 368 lb (166.9 kg)    LMP 04/10/2006   BMI 52.80 kg/m   Physical Exam Constitutional:      Appearance: She is well-developed.  HENT:     Head: Normocephalic and atraumatic.  Eyes:     Pupils: Pupils are equal, round, and reactive to light.  Pulmonary:     Effort: Pulmonary effort is normal.     Breath sounds: Normal breath sounds.  Abdominal:     General: Bowel sounds are normal.     Palpations: Abdomen is soft.  Musculoskeletal:     Cervical back: Normal range of motion and neck supple.     Lumbar back: Negative right straight leg raise test and negative left straight leg raise test.  Skin:    General: Skin is warm and dry.  Neurological:     Mental Status: She is alert and oriented to person, place, and time.  Psychiatric:        Behavior: Behavior normal.        Thought Content: Thought content normal.        Judgment: Judgment normal.     Back Exam   Tenderness  The patient is experiencing tenderness in the lumbar.  Range of Motion  Extension: abnormal  Flexion: abnormal  Lateral bend right: abnormal  Lateral bend left: abnormal  Rotation right: abnormal  Rotation left: abnormal   Muscle Strength  Right Quadriceps:  5/5  Left Quadriceps:  5/5  Right Hamstrings:  5/5  Left Hamstrings:  5/5   Tests  Straight leg raise right: negative Straight leg raise left: negative  Reflexes  Patellar: 0/4 Achilles: 0/4 Biceps: 0/4 Babinski's sign: normal   Other  Toe walk: normal Heel walk: normal Sensation: normal Gait: normal  Erythema: no back redness Scars: absent  Comments:  Shot left knee did not take.      Specialty Comments:  No specialty comments available.  Imaging: XR Lumbar Spine 2-3 Views  Result Date: 07/02/2019 AP and lateral lubar spine with anterior fusions L4-5 and L5-S1 with rudimentary disc Si-S2. There is PLIF at L3-4, hardware, pedicle screws and rods L3 to S1 in good position and alignment. No acute changes. DIffus spondylosis above the fusion  with anterior coalescing spurs at the 3 levels above her fusion. No fracture of dislocation.     PMFS History: Patient Active Problem List   Diagnosis Date Noted  . Body mass index 50.0-59.9, adult ([place]) 06/12/2019  . Constipation due to outlet dysfunction   . Impingement syndrome of left shoulder 05/07/2019  . Polyp of colon   . Polyp of ascending colon   . Rectal discomfort 02/12/2019  . Osteoarthritis of  right hip 02/12/2019  . Nontraumatic tear of left supraspinatus tendon 12/30/2018  . Gout 12/17/2018  . Nontraumatic complete tear of right rotator cuff 12/08/2018  . Rotator cuff tear, right 12/01/2018  . Chest pain 09/24/2018  . Right groin pain 07/20/2018  . Depressed mood 07/13/2018  . Mouth pain 06/02/2018  . Paroxysmal SVT (supraventricular tachycardia) ([place]) 03/20/2018  . Pure hypercholesterolemia 03/20/2018  . Muscle cramping 02/25/2018  . Nodule of upper lobe of right lung 02/13/2018  . Falls, subsequent encounter 01/30/2018  . Pain in left foot 11/04/2017  . Open wound of left foot 11/04/2017  . Essential hypertension 10/27/2017  . Solitary pulmonary nodule 10/07/2017  . Restrictive lung disease secondary to obesity 10/01/2017  . Polycythemia, secondary 10/01/2017  . Chronic viral hepatitis B without delta-agent ([place]) 07/08/2017  . COPD (chronic obstructive pulmonary disease) with chronic bronchitis ([place]) 06/27/2017  . Chronic kidney disease (CKD), stage IV (severe) ([place]) 01/14/2017  . Type 2 diabetes mellitus with stage 3 chronic kidney disease, with long-term current use of insulin ([place]) 12/12/2016  . Urge incontinence of urine 12/05/2016  . Trigger finger, left index finger 07/15/2016  . Back pain 04/07/2014  . Morbid obesity ([place]) 10/02/2012  . Chronic diastolic CHF (congestive heart failure) ([place]) 04/07/2012  . GERD 01/26/2010  . Obstructive sleep apnea treated with BiPAP 02/03/2008  . FIBROCYSTIC BREAST DISEASE 10/28/2006  . Hyperlipidemia 03/27/2006    . Obesity hypoventilation syndrome ([place]) 03/27/2006  . Former tobacco use 03/27/2006  . Depression with anxiety 03/27/2006   Past Medical History:  Diagnosis Date  . Alcoholism ([place])   . Anxiety   . Arthritis 04-10-11   hips, shoulders, back  . Asthma   . Bipolar disorder ([place])   . Cardiomegaly   . Cervical cancer ([place]) 1993   cervical, no treatment done, went away per pt  . Cervical dysplasia or atypia 04-10-11   '93- once dx.-got pregnant-no intervention, then postpartum, no dysplasia found  . CHF (congestive heart failure) ([place])    no cardiologist 2014 dx, none now  . Chronic hypoxemic respiratory failure ([place])   . Chronic kidney disease    stage 3 kidney disease  . 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 Pathology showed Condyloma, gluteal CONDYLOMA ACUMINATUM.   [name] Kitchen COPD (chronic obstructive pulmonary disease) ([place])   . Depression   . Diabetes mellitus without complication ([place])   . Dyspnea    with actity, sitting  . Fall 12/16/2016  . Fibrocystic breast disease   . GERD (gastroesophageal reflux disease)   . Grade I diastolic dysfunction 32/20/2542   Noted on ECHO  . Hepatitis    hep B-count is low at present,doesn't register  . History of PSVT (paroxysmal supraventricular tachycardia)   . Hypercalcemia   . Hyperlipidemia   . Hypertension   . Incomplete left bundle branch block (LBBB) 09/24/2018   Noted on EKG  . Lung nodule 11/2017   right and left lung  . Migraine headache    none recent  . Morbid obesity ([place]) 03/27/2006  . Neuropathy   . Pneumonia    walking pneu 15 yrs. ago  . Skin lesion 03/15/2011   In gluteal crease now s/p removal by Dr. [name] of General Surgery on 3/12. Path shows condyloma.     . Sleep apnea 04-10-11   uses cpap, pt does not know settings  . Trigger finger    left third  . Urge incontinence of urine  Family History  Problem Relation Age of Onset  . Pulmonary embolism Mother   . Stroke Father   .  Alcohol abuse Father   . Pneumonia Father   . Hypertension Father   . Breast cancer Maternal Aunt   . Hypertension Other   . Diabetes Other        Aunts and cousins  . Asthma Sister   . Depression Sister   . Arthritis Sister   . Sickle cell anemia Son     Past Surgical History:  Procedure Laterality Date  . ANAL FISTULECTOMY  04/17/2011   Procedure: FISTULECTOMY ANAL;  Surgeon: Imogene Burn. [name], MD;  Location: [facility];  Service: General;  Laterality: N/A;  Excision of Condyloma Gluteal Cleft   . ANAL RECTAL MANOMETRY N/A 03/26/2019   Procedure: ANO RECTAL MANOMETRY;  Surgeon: Thornton Park, MD;  Location: [facility];  Service: Gastroenterology;  Laterality: N/A;  . BIOPSY  03/09/2019   Procedure: BIOPSY;  Surgeon: Thornton Park, MD;  Location: [facility];  Service: Gastroenterology;;  . Lum [place] INJECTION N/A 12/29/2018   Procedure: BOTOX INJECTION(100 UNITS)  WITH CYSTOSCOPY;  Surgeon: Ceasar Mons, MD;  Location: [facility];  Service: Urology;  Laterality: N/A;  . BREAST CYST EXCISION     bilateral breast, 3 cysts removed from each breast  . BREAST EXCISIONAL BIOPSY Right    x 3  . BREAST EXCISIONAL BIOPSY Left    x 3  . CARPAL TUNNEL RELEASE Bilateral   . Cervical biospy    . CERVICAL FUSION  04-10-11   x3- cervical fusion with plating and screws-Dr. [name]  . COLONOSCOPY WITH PROPOFOL N/A 07/06/2015   Procedure: COLONOSCOPY WITH PROPOFOL;  Surgeon: [name], MD;  [facility];  Service: Endoscopy;  Laterality: N/A;  . COLONOSCOPY WITH PROPOFOL N/A 03/09/2019   Procedure: COLONOSCOPY WITH PROPOFOL;  Surgeon: Thornton Park, MD;  Location[facility];  Service: Gastroenterology;  Laterality: N/A;  . DOPPLER ECHOCARDIOGRAPHY  04/06/2012   AT [place] 55-60%  . FOOT SURGERY Left 2018  . FRACTURE SURGERY     little toe left foot  . I & D EXTREMITY Left 12/18/2016   Procedure: IRRIGATION AND DEBRIDEMENT LEFT FOOT, CLOSURE;  Surgeon: [name], MD;  Location: [facility];  Service: Orthopedics;  Laterality: Left;  . [place] SURGERY  04-10-11   x5-Lumbar fusion-retained hardware.(Dr. Louanne Skye)  . MULTIPLE TOOTH EXTRACTIONS    . POLYPECTOMY  03/09/2019   Procedure: POLYPECTOMY;  Surgeon: Thornton Park, MD;  [facility];  Service: Gastroenterology;;  . SHOULDER ARTHROSCOPY WITH ROTATOR CUFF REPAIR AND SUBACROMIAL DECOMPRESSION Left 05/07/2019   Procedure: LEFT SHOULDER ARTHROSCOPY WITH DEBRIDEMENT, DISTAL CLAVICLE EXCISION,  SUBACROMIAL DECOMPRESSION AND POSSIBLE ROTATOR CUFF REPAIR;  Surgeon: [name], MD;  Location: [facility];  Service: Orthopedics;  Laterality: Left;  . TEE WITHOUT CARDIOVERSION N/A 04/06/2012   Procedure: TRANSESOPHAGEAL ECHOCARDIOGRAM (TEE);  Surgeon: [name], MD;  Location: [facility];  Service: Cardiovascular;  Laterality: N/A;  . TRIGGER FINGER RELEASE Right    middle finger  . TRIGGER FINGER RELEASE Left 06/28/2016   Procedure: RELEASE TRIGGER FINGER LEFT 3RD FINGER;  Surgeon: [name], MD;  Location: [facility];  Service: Orthopedics;  Laterality: Left;  . TRIGGER FINGER RELEASE Right 06/12/2016   Procedure: RIGHT INDEX FINGER TRIGGER RELEASE;  Surgeon: [name], MD;  Location: [facility];  Service: Orthopedics;  Laterality: Right;  . TRIGGER FINGER RELEASE Right 01/19/2018   Procedure: RIGHT  RING FINGER TRIGGER FINGER RELEASE;  Surgeon: [name], MD;  Location: [facility];  Service: Orthopedics;  Laterality: Right;  . TRIGGER FINGER RELEASE Left 05/07/2019   Procedure: RELEASE TRIGGER FINGER LEFT INDEX FINGER;  Surgeon: [name], MD;  Location: [facility];  Service: Orthopedics;  Laterality: Left;   Social History   Occupational History  . Occupation: disabled    Comment: Disabled  Tobacco Use  . Smoking status: Current Every Day Smoker    Packs/day: 0.20    Years: 40.00    Pack years: 8.00    Types: Cigarettes    Start date: 01/28/1977  . Smokeless tobacco: Never Used  . Tobacco  comment: 4-5 cigarettes per day  Substance and Sexual Activity  . Alcohol use: No    Alcohol/week: 0.0 standard drinks  . Drug use: Not Currently    Types: Marijuana, "Crack" cocaine    Comment: hx marijuana usemany years ago, Crack -none since 11/2015  . Sexual activity: Not Currently    Partners: Male    Birth control/protection: Post-menopausal

## 2019-07-02 NOTE — Patient Instructions (Addendum)
Avoid frequent bending and stooping  No lifting greater than 10 lbs. May use ice or moist heat for pain. Weight loss is of benefit. You should not take NSAIDs due to kidney disease and diabetes.  Exercise is important to improve your indurance and does allow people to function better inspite of back pain. Ice the area of discomfort and you can use a heating pad intermittantly, Be careful as the pad may burn you If it remains on the skin too long.  Meds for pain control through pain management. Will ask Home Health to go to your house to work with mobilizing and treating this acute lumbar Sprain/Strain.  Plan: Knee is suffering from osteoarthritis, only real proven treatments are Weight loss and exercise. Well padded shoes help. Ice the knee that is suffering from osteoarthritis, only real proven treatments are  Well padded shoes help. Ice the knee 2-3 times a day 15-20 mins at a time.-3 times a day 15-20 mins at a time. Hot showers in the AM.  Injection with steroid may be of benefit. Hemp CBD capsules, amazon.com 5,000-7,000 mg per bottle, 60 capsules per bottle, take one capsule twice a day. Cane in the left hand to use with left leg weight bearing. Follow-Up Instructions: No follow-ups on file.

## 2019-07-02 NOTE — Progress Notes (Deleted)
Office Visit Note   Patient: [name]           Date of Birth: 1965-12-09           MRN: [id number] Visit Date: 07/02/2019              Requested by: [name], MD [address],  [place] [address] PCP: [name], MD   Assessment & Plan: Visit Diagnoses:  1. S/P lumbar fusion     Plan: ***  Follow-Up Instructions: No follow-ups on file.   Orders:  Orders Placed This Encounter  Procedures  . XR Lumbar Spine 2-3 Views   No orders of the defined types were placed in this encounter.     Procedures: No procedures performed   Clinical Data: No additional findings.   Subjective: Chief Complaint  Patient presents with  . Lower Back - Pain    SUBJECTIVE:  [name] is a 54 y.o. female who complains of an injury causing low back pain *** {gen duration:315003} ago. The pain is positional with bending or lifting, {w-w/o:315700} radiation down the legs. Mechanism of injury: ***. Symptoms have been {gen onset/course:315708} since that time. Prior history of back problems: {back pain prior hx:315285::"recurrent self limited episodes of low back pain in the past"}. There {is/are:315283} numbness in the legs.  OBJECTIVE: Vital signs as noted above. Patient appears to be in mild to moderate pain, antalgic gait noted. Lumbosacral spine area reveals no local tenderness or mass. Painful and reduced LS ROM noted. Straight leg raise is {pos_neg:315050} at *** degrees on {gen laterality:315002}. DTR's, motor strength and sensation normal, including heel and toe gait.  Peripheral pulses are palpable. Lumbar spine X-Ray: {xr findings:315769}.   ASSESSMENT:  {back pain dx:315351::"lumbar strain"}  PLAN: For acute pain, rest, intermittent application of cold packs (later, may switch to heat, but do not sleep on heating pad), analgesics and muscle relaxants are recommended. Discussed longer term treatment plan of prn NSAID's and discussed a home back care  exercise program with flexion exercise routine. Proper lifting with avoidance of heavy lifting discussed. Consider Physical Therapy and XRay studies if not improving. Call or return to clinic prn if these symptoms worsen or fail to improve as anticipated.    Review of Systems   Objective: Vital Signs: BP 131/80 (BP Location: Left Arm, Patient Position: Sitting)   Pulse 83   Ht 5\' 10"  (1.778 m)   Wt (!) 368 lb (166.9 kg)   LMP 04/10/2006   BMI 52.80 kg/m   Physical Exam  Ortho Exam  Specialty Comments:  No specialty comments available.  Imaging: No results found.   PMFS History: Patient Active Problem List   Diagnosis Date Noted  . Body mass index 50.0-59.9, adult ([place]) 06/12/2019  . Constipation due to outlet dysfunction   . Impingement syndrome of left shoulder 05/07/2019  . Polyp of colon   . Polyp of ascending colon   . Rectal discomfort 02/12/2019  . Osteoarthritis of right hip 02/12/2019  . Nontraumatic tear of left supraspinatus tendon 12/30/2018  . Gout 12/17/2018  . Nontraumatic complete tear of right rotator cuff 12/08/2018  . Rotator cuff tear, right 12/01/2018  . Chest pain 09/24/2018  . Right groin pain 07/20/2018  . Depressed mood 07/13/2018  . Mouth pain 06/02/2018  . Paroxysmal SVT (supraventricular tachycardia) ([place]) 03/20/2018  . Pure hypercholesterolemia 03/20/2018  . Muscle cramping 02/25/2018  . Nodule of upper lobe of right lung 02/13/2018  . Falls,  subsequent encounter 01/30/2018  . Pain in left foot 11/04/2017  . Open wound of left foot 11/04/2017  . Essential hypertension 10/27/2017  . Solitary pulmonary nodule 10/07/2017  . Restrictive lung disease secondary to obesity 10/01/2017  . Polycythemia, secondary 10/01/2017  . Chronic viral hepatitis B without delta-agent ([place]) 07/08/2017  . COPD (chronic obstructive pulmonary disease) with chronic bronchitis ([place]) 06/27/2017  . Chronic kidney disease (CKD), stage IV (severe) ([place]) 01/14/2017   . Type 2 diabetes mellitus with stage 3 chronic kidney disease, with long-term current use of insulin ([place]) 12/12/2016  . Urge incontinence of urine 12/05/2016  . Trigger finger, left index finger 07/15/2016  . Back pain 04/07/2014  . Morbid obesity ([place]) 10/02/2012  . Chronic diastolic CHF (congestive heart failure) ([place]) 04/07/2012  . GERD 01/26/2010  . Obstructive sleep apnea treated with BiPAP 02/03/2008  . FIBROCYSTIC BREAST DISEASE 10/28/2006  . Hyperlipidemia 03/27/2006  . Obesity hypoventilation syndrome ([place]) 03/27/2006  . Former tobacco use 03/27/2006  . Depression with anxiety 03/27/2006   Past Medical History:  Diagnosis Date  . Alcoholism ([place])   . Anxiety   . Arthritis 04-10-11   hips, shoulders, back  . Asthma   . Bipolar disorder ([place])   . Cardiomegaly   . Cervical cancer ([place]) 1993   cervical, no treatment done, went away per pt  . Cervical dysplasia or atypia 04-10-11   '93- once dx.-got pregnant-no intervention, then postpartum, no dysplasia found  . CHF (congestive heart failure) ([place])    no cardiologist 2014 dx, none now  . Chronic hypoxemic respiratory failure ([place])   . Chronic kidney disease    stage 3 kidney disease  . 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 Pathology showed Condyloma, gluteal CONDYLOMA ACUMINATUM.   [name] Kitchen COPD (chronic obstructive pulmonary disease) ([place])   . Depression   . Diabetes mellitus without complication ([place])   . Dyspnea    with actity, sitting  . Fall 12/16/2016  . Fibrocystic breast disease   . GERD (gastroesophageal reflux disease)   . Grade I diastolic dysfunction 78/67/6720   Noted on ECHO  . Hepatitis    hep B-count is low at present,doesn't register  . History of PSVT (paroxysmal supraventricular tachycardia)   . Hypercalcemia   . Hyperlipidemia   . Hypertension   . Incomplete left bundle branch block (LBBB) 09/24/2018   Noted on EKG  . Lung nodule 11/2017   right and left lung  .  Migraine headache    none recent  . Morbid obesity ([place]) 03/27/2006  . Neuropathy   . Pneumonia    walking pneu 15 yrs. ago  . Skin lesion 03/15/2011   In gluteal crease now s/p removal by Dr. [name] of General Surgery on 3/12. Path shows condyloma.     . Sleep apnea 04-10-11   uses cpap, pt does not know settings  . Trigger finger    left third  . Urge incontinence of urine     Family History  Problem Relation Age of Onset  . Pulmonary embolism Mother   . Stroke Father   . Alcohol abuse Father   . Pneumonia Father   . Hypertension Father   . Breast cancer Maternal Aunt   . Hypertension Other   . Diabetes Other        Aunts and cousins  . Asthma Sister   . Depression Sister   . Arthritis Sister   . Sickle cell anemia Son  Past Surgical History:  Procedure Laterality Date  . ANAL FISTULECTOMY  04/17/2011   Procedure: FISTULECTOMY ANAL;  Surgeon: Imogene Burn. [name], MD;  Location: [facility];  Service: General;  Laterality: N/A;  Excision of Condyloma Gluteal Cleft   . ANAL RECTAL MANOMETRY N/A 03/26/2019   Procedure: ANO RECTAL MANOMETRY;  Surgeon: Thornton Park, MD;  Location: [facility];  Service: Gastroenterology;  Laterality: N/A;  . BIOPSY  03/09/2019   Procedure: BIOPSY;  Surgeon: Thornton Park, MD;  Location: [facility];  Service: Gastroenterology;;  . Lum [place] INJECTION N/A 12/29/2018   Procedure: BOTOX INJECTION(100 UNITS)  WITH CYSTOSCOPY;  Surgeon: Ceasar Mons, MD;  Location: [facility];  Service: Urology;  Laterality: N/A;  . BREAST CYST EXCISION     bilateral breast, 3 cysts removed from each breast  . BREAST EXCISIONAL BIOPSY Right    x 3  . BREAST EXCISIONAL BIOPSY Left    x 3  . CARPAL TUNNEL RELEASE Bilateral   . Cervical biospy    . CERVICAL FUSION  04-10-11   x3- cervical fusion with plating and screws-Dr. [name]  . COLONOSCOPY WITH PROPOFOL N/A 07/06/2015   Procedure: COLONOSCOPY WITH PROPOFOL;  Surgeon: [name], MD;  Location: [facility];   Service: Endoscopy;  Laterality: N/A;  . COLONOSCOPY WITH PROPOFOL N/A 03/09/2019   Procedure: COLONOSCOPY WITH PROPOFOL;  Surgeon: Thornton Park, MD;  Location: [facility];  Service: Gastroenterology;  Laterality: N/A;  . DOPPLER ECHOCARDIOGRAPHY  04/06/2012   AT [name] 55-60%  . FOOT SURGERY Left 2018  . FRACTURE SURGERY     little toe left foot  . I & D EXTREMITY Left 12/18/2016   Procedure: IRRIGATION AND DEBRIDEMENT LEFT FOOT, CLOSURE;  Surgeon: [name], MD;  Location: [facility];  Service: Orthopedics;  Laterality: Left;  . [place] SURGERY  04-10-11   x5-Lumbar fusion-retained hardware.(Dr. Louanne Skye)  . MULTIPLE TOOTH EXTRACTIONS    . POLYPECTOMY  03/09/2019   Procedure: POLYPECTOMY;  Surgeon: Thornton Park, MD;  Location: [facility];  Service: Gastroenterology;;  . SHOULDER ARTHROSCOPY WITH ROTATOR CUFF REPAIR AND SUBACROMIAL DECOMPRESSION Left 05/07/2019   Procedure: LEFT SHOULDER ARTHROSCOPY WITH DEBRIDEMENT, DISTAL CLAVICLE EXCISION,  SUBACROMIAL DECOMPRESSION AND POSSIBLE ROTATOR CUFF REPAIR;  Surgeon: [name], MD;  Location: [facility];  Service: Orthopedics;  Laterality: Left;  . TEE WITHOUT CARDIOVERSION N/A 04/06/2012   Procedure: TRANSESOPHAGEAL ECHOCARDIOGRAM (TEE);  Surgeon: [name], MD;  Location: [facility];  Service: Cardiovascular;  Laterality: N/A;  . TRIGGER FINGER RELEASE Right    middle finger  . TRIGGER FINGER RELEASE Left 06/28/2016   Procedure: RELEASE TRIGGER FINGER LEFT 3RD FINGER;  Surgeon: [name], MD;  Location: [facility];  Service: Orthopedics;  Laterality: Left;  . TRIGGER FINGER RELEASE Right 06/12/2016   Procedure: RIGHT INDEX FINGER TRIGGER RELEASE;  Surgeon: [name], MD;  Location: [facility];  Service: Orthopedics;  Laterality: Right;  . TRIGGER FINGER RELEASE Right 01/19/2018   Procedure: RIGHT RING FINGER TRIGGER FINGER RELEASE;  Surgeon: [name], MD;  Location: [facility];  Service: Orthopedics;  Laterality: Right;    . TRIGGER FINGER RELEASE Left 05/07/2019   Procedure: RELEASE TRIGGER FINGER LEFT INDEX FINGER;  Surgeon: [name], MD;  Location: [facility];  Service: Orthopedics;  Laterality: Left;   Social History   Occupational History  . Occupation: disabled    Comment: Disabled  Tobacco Use  . Smoking status: Current Every Day Smoker    Packs/day:  0.20    Years: 40.00    Pack years: 8.00    Types: Cigarettes    Start date: 01/28/1977  . Smokeless tobacco: Never Used  . Tobacco comment: 4-5 cigarettes per day  Substance and Sexual Activity  . Alcohol use: No    Alcohol/week: 0.0 standard drinks  . Drug use: Not Currently    Types: Marijuana, "Crack" cocaine    Comment: hx marijuana usemany years ago, Crack -none since 11/2015  . Sexual activity: Not Currently    Partners: Male    Birth control/protection: Post-menopausal

## 2019-07-05 ENCOUNTER — Ambulatory Visit: Payer: Medicare Other | Admitting: Pharmacist

## 2019-07-05 DIAGNOSIS — G4733 Obstructive sleep apnea (adult) (pediatric): Secondary | ICD-10-CM | POA: Diagnosis not present

## 2019-07-05 DIAGNOSIS — M5090 Cervical disc disorder, unspecified, unspecified cervical region: Secondary | ICD-10-CM | POA: Diagnosis not present

## 2019-07-05 DIAGNOSIS — J9621 Acute and chronic respiratory failure with hypoxia: Secondary | ICD-10-CM | POA: Diagnosis not present

## 2019-07-06 ENCOUNTER — Encounter: Payer: Self-pay | Admitting: Cardiology

## 2019-07-06 ENCOUNTER — Ambulatory Visit (INDEPENDENT_AMBULATORY_CARE_PROVIDER_SITE_OTHER): Payer: Medicare Other | Admitting: Cardiology

## 2019-07-06 ENCOUNTER — Other Ambulatory Visit: Payer: Self-pay

## 2019-07-06 ENCOUNTER — Ambulatory Visit: Payer: Medicare Other | Admitting: Physical Therapy

## 2019-07-06 VITALS — BP 114/82 | HR 78 | Ht 70.0 in | Wt 366.0 lb

## 2019-07-06 DIAGNOSIS — Z794 Long term (current) use of insulin: Secondary | ICD-10-CM

## 2019-07-06 DIAGNOSIS — I10 Essential (primary) hypertension: Secondary | ICD-10-CM

## 2019-07-06 DIAGNOSIS — I5032 Chronic diastolic (congestive) heart failure: Secondary | ICD-10-CM | POA: Diagnosis not present

## 2019-07-06 DIAGNOSIS — I471 Supraventricular tachycardia: Secondary | ICD-10-CM | POA: Diagnosis not present

## 2019-07-06 DIAGNOSIS — Z716 Tobacco abuse counseling: Secondary | ICD-10-CM

## 2019-07-06 DIAGNOSIS — E1122 Type 2 diabetes mellitus with diabetic chronic kidney disease: Secondary | ICD-10-CM

## 2019-07-06 DIAGNOSIS — E7800 Pure hypercholesterolemia, unspecified: Secondary | ICD-10-CM

## 2019-07-06 DIAGNOSIS — N183 Chronic kidney disease, stage 3 unspecified: Secondary | ICD-10-CM

## 2019-07-06 NOTE — Progress Notes (Signed)
Cardiology Office Note:    Date:  10/06/2639   ID:  RUIE SENDEJO, DOB [birth year], MRN [id number]  PCP:  [name], MD  Cardiologist:  [name], MD PhD  Referring MD: [name], MD   CC: follow up  History of Present Illness:    [name] is a 54 y.o. female with chronic diastolic heart failure, chronic hypoxic respiratory failure, COPD, OSA on PAP, morbid obesity, paroxysmal SVT, type II diabetes on insulin, CKD stage 3, hypertension, hyperlipidemia  Cardiac history: Complex, see my initial notes. Has struggled with diastolic heart failure, has chest pai when she gets fluid overloaded. Was on oxygen, having "breathing crises" that seemed to be related to paroxysmal SVT. She was well controlled for many months, then had an episode requiring hospitalization 08/2018 for chest pain and elevated weight. HsTn unremarkable (6 -> 3), no SVT on monitor. BNP 41.7. Covid negative. Was diuresed, lost fluid, swelling on left side went down while she was laying down but came back when she got back up. She attributes the episode to a myelogram, gained 16 lbs and started having symptoms.  Today: Did well with shoulder surgery/trigger finger surgery on 05/07/19. In physical therapy,working on getting home PT as well. Still struggling with other joint issues, pain. Now left knee causing issues in addition to her back. Feels that she is managing as best she can.  Breathing has been stable. Can't carry oxygen tank as it is too heavy, on the wait list for smaller O2 concentrator. Has to walk slowly, breath deeply to keep her O2 up. No falls. Uses O2 with her Bipap and occasionally at other times at home, depending on how she feels.   Has been working hard on weight loss. Down to 366 lbs, congratulated. Needs to lose 100 lbs to get her hip done, though plans to get other shoulder, back done prior to hip surgery.  Working on quitting smoking, congratulated on this. Using chewing  gum and chantix. Smoking about 1/4 ppd. Understands risk of smoking with home O2.   Denies chest pain. No PND, orthopnea, LE edema or unexpected weight gain. No syncope. Rare palpitations.  Past Medical History:  Diagnosis Date  . Alcoholism ([place])   . Anxiety   . Arthritis 04-10-11   hips, shoulders, back  . Asthma   . Bipolar disorder ([place])   . Cardiomegaly   . Cervical cancer ([place]) 1993   cervical, no treatment done, went away per pt  . Cervical dysplasia or atypia 04-10-11   '93- once dx.-got pregnant-no intervention, then postpartum, no dysplasia found  . CHF (congestive heart failure) ([place])    no cardiologist 2014 dx, none now  . Chronic hypoxemic respiratory failure ([place])   . Chronic kidney disease    stage 3 kidney disease  . 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 Pathology showed Condyloma, gluteal CONDYLOMA ACUMINATUM.   [name] Kitchen COPD (chronic obstructive pulmonary disease) ([place])   . Depression   . Diabetes mellitus without complication ([place])   . Dyspnea    with actity, sitting  . Fall 12/16/2016  . Fibrocystic breast disease   . GERD (gastroesophageal reflux disease)   . Grade I diastolic dysfunction 31/59/4585   Noted on ECHO  . Hepatitis    hep B-count is low at present,doesn't register  . History of PSVT (paroxysmal supraventricular tachycardia)   . Hypercalcemia   . Hyperlipidemia   . Hypertension   . Incomplete left bundle branch block (  LBBB) 09/24/2018   Noted on EKG  . Lung nodule 11/2017   right and left lung  . Migraine headache    none recent  . Morbid obesity ([place]) 03/27/2006  . Neuropathy   . Pneumonia    walking pneu 15 yrs. ago  . Skin lesion 03/15/2011   In gluteal crease now s/p removal by Dr. [name] of General Surgery on 3/12. Path shows condyloma.     . Sleep apnea 04-10-11   uses cpap, pt does not know settings  . Trigger finger    left third  . Urge incontinence of urine     Past Surgical History:  Procedure  Laterality Date  . ANAL FISTULECTOMY  04/17/2011   Procedure: FISTULECTOMY ANAL;  Surgeon: Imogene Burn. [name], MD;  Location: [facility];  Service: General;  Laterality: N/A;  Excision of Condyloma Gluteal Cleft   . ANAL RECTAL MANOMETRY N/A 03/26/2019   Procedure: ANO RECTAL MANOMETRY;  Surgeon: Thornton Park, MD;  [facility];  Service: Gastroenterology;  Laterality: N/A;  . BIOPSY  03/09/2019   Procedure: BIOPSY;  Surgeon: Thornton Park, MD;  [facility];  Service: Gastroenterology;;  . Lum Keas INJECTION N/A 12/29/2018   Procedure: BOTOX INJECTION(100 UNITS)  WITH CYSTOSCOPY;  Surgeon: Ceasar Mons, MD;  Location: [facility];  Service: Urology;  Laterality: N/A;  . BREAST CYST EXCISION     bilateral breast, 3 cysts removed from each breast  . BREAST EXCISIONAL BIOPSY Right    x 3  . BREAST EXCISIONAL BIOPSY Left    x 3  . CARPAL TUNNEL RELEASE Bilateral   . Cervical biospy    . CERVICAL FUSION  04-10-11   x3- cervical fusion with plating and screws-Dr. [name]  . COLONOSCOPY WITH PROPOFOL N/A 07/06/2015   Procedure: COLONOSCOPY WITH PROPOFOL;  Surgeon: [name], MD;  [facility];  Service: Endoscopy;  Laterality: N/A;  . COLONOSCOPY WITH PROPOFOL N/A 03/09/2019   Procedure: COLONOSCOPY WITH PROPOFOL;  Surgeon: Thornton Park, MD;  [facility];  Service: Gastroenterology;  Laterality: N/A;  . DOPPLER ECHOCARDIOGRAPHY  04/06/2012   AT [place] 55-60%  . FOOT SURGERY Left 2018  . FRACTURE SURGERY     little toe left foot  . I & D EXTREMITY Left 12/18/2016   Procedure: IRRIGATION AND DEBRIDEMENT LEFT FOOT, CLOSURE;  Surgeon: [name], MD;  Location: [place];  Service: Orthopedics;  Laterality: Left;  . [place] SURGERY  04-10-11   x5-Lumbar fusion-retained hardware.(Dr. Louanne Skye)  . MULTIPLE TOOTH EXTRACTIONS    . POLYPECTOMY  03/09/2019   Procedure: POLYPECTOMY;  Surgeon: Thornton Park, MD;  [facility];  Service:  Gastroenterology;;  . SHOULDER ARTHROSCOPY WITH ROTATOR CUFF REPAIR AND SUBACROMIAL DECOMPRESSION Left 05/07/2019   Procedure: LEFT SHOULDER ARTHROSCOPY WITH DEBRIDEMENT, DISTAL CLAVICLE EXCISION,  SUBACROMIAL DECOMPRESSION AND POSSIBLE ROTATOR CUFF REPAIR;  Surgeon: [name], MD;  Location: [place];  Service: Orthopedics;  Laterality: Left;  . TEE WITHOUT CARDIOVERSION N/A 04/06/2012   Procedure: TRANSESOPHAGEAL ECHOCARDIOGRAM (TEE);  Surgeon: [name], MD;  Location[facility];  Service: Cardiovascular;  Laterality: N/A;  . TRIGGER FINGER RELEASE Right    middle finger  . TRIGGER FINGER RELEASE Left 06/28/2016   Procedure: RELEASE TRIGGER FINGER LEFT 3RD FINGER;  Surgeon: [name], MD;  Location: [place];  Service: Orthopedics;  Laterality: Left;  . TRIGGER FINGER RELEASE Right 06/12/2016   Procedure: RIGHT INDEX FINGER TRIGGER RELEASE;  Surgeon: [name],  MD;  Location: [place];  Service: Orthopedics;  Laterality: Right;  . TRIGGER FINGER RELEASE Right 01/19/2018   Procedure: RIGHT RING FINGER TRIGGER FINGER RELEASE;  Surgeon: [name], MD;  Location: [place];  Service: Orthopedics;  Laterality: Right;  . TRIGGER FINGER RELEASE Left 05/07/2019   Procedure: RELEASE TRIGGER FINGER LEFT INDEX FINGER;  Surgeon: [name], MD;  Location: [place];  Service: Orthopedics;  Laterality: Left;    Current Medications: Current Outpatient Medications on File Prior to Visit  Medication Sig  . Accu-Chek Softclix Lancets lancets TEST 4 TIMES DAILY  . acetaminophen (TYLENOL) 500 MG tablet Take 1,000 mg by mouth 2 (two) times daily.   [name] Kitchen albuterol (VENTOLIN HFA) 108 (90 Base) MCG/ACT inhaler Inhale 2 puffs into the lungs every 6 (six) hours as needed for wheezing or shortness of breath.  [name] Kitchen ammonium lactate (LAC-HYDRIN) 12 % lotion APPLY TWICE A DAY AS NEEDED FOR DRY SKIN  . atorvastatin (LIPITOR) 40 MG tablet TAKE 1 TABLET BY MOUTH EVERY DAY (Patient taking differently: Take 40 mg by mouth  daily. )  . b complex vitamins capsule Take 1 capsule by mouth daily.  . baclofen (LIORESAL) 10 MG tablet Take 1 tablet (10 mg total) by mouth 3 (three) times daily as needed for muscle spasms. (Patient taking differently: Take 10 mg by mouth 3 (three) times daily. )  . blood glucose meter kit and supplies KIT Dispense based on patient and insurance preference. Use up to four times daily as directed. (FOR ICD-9 250.00, 250.01).  . busPIRone (BUSPAR) 10 MG tablet TAKE 1 TABLET BY MOUTH THREE TIMES A DAY (Patient taking differently: Take 10 mg by mouth 3 (three) times daily. )  . diclofenac Sodium (VOLTAREN) 1 % GEL APPLY 2 GRAMS TO AFFECTED AREA 4 TIMES A DAY  . diltiazem (CARDIZEM CD) 120 MG 24 hr capsule Take 1 capsule (120 mg total) by mouth 2 (two) times daily.  [name] Kitchen docusate sodium (COLACE) 100 MG capsule Take 200 mg by mouth 2 (two) times daily.  . empagliflozin (JARDIANCE) 25 MG TABS tablet Take 25 mg by mouth daily.  . febuxostat (ULORIC) 40 MG tablet Take 40 mg by mouth at bedtime.   . fluticasone (FLONASE) 50 MCG/ACT nasal spray Place 2 sprays into both nostrils 2 (two) times daily.  . Fluticasone-Umeclidin-Vilant (TRELEGY ELLIPTA) 100-62.5-25 MCG/INH AEPB Inhale 1 puff into the lungs daily.  . folic acid (FOLVITE) 832 MCG tablet Take 400 mcg by mouth daily.  . furosemide (LASIX) 40 MG tablet TAKE 2 TABLETS (80 MG TOTAL) BY MOUTH 2 (TWO) TIMES DAILY.  [name] Kitchen gabapentin (NEURONTIN) 300 MG capsule TAKE 2 CAPSULES (600 MG TOTAL) BY MOUTH 3 (THREE) TIMES DAILY.  [name] Kitchen glucose blood (ACCU-CHEK AVIVA PLUS) test strip 1 each by Other route 3 (three) times daily. TEST 3 TIMES DAILY  . HYDROcodone-acetaminophen (NORCO) 7.5-325 MG tablet Take 1 tablet by mouth every 6 (six) hours as needed for moderate pain. (Patient taking differently: Take 1 tablet by mouth in the morning, at noon, in the evening, and at bedtime. )  . hydrOXYzine (ATARAX/VISTARIL) 10 MG tablet TAKE 1 TABLET BY MOUTH EVERY DAY (Patient taking  differently: Take 10 mg by mouth daily. )  . insulin glargine (LANTUS SOLOSTAR) 100 UNIT/ML Solostar Pen Inject 20 Units into the skin daily before breakfast.  . insulin lispro (HUMALOG KWIKPEN) 100 UNIT/ML KwikPen Inject 0.08 mLs (8 Units total) into the skin 3 (three) times daily before meals.  . Insulin Pen  Needle (B-D UF III MINI PEN NEEDLES) 31G X 5 MM MISC CHECK SUGARS 4 TIMES A DAY BEFORE MEALS AND AT BEDTIME.  [name] Kitchen ketoconazole (NIZORAL) 2 % cream APPLY 1 FINGERTIP AMOUNT TO EACH FOOT DAILY.  [name] Kitchen loratadine (CLARITIN) 10 MG tablet TAKE 1 TABLET BY MOUTH EVERY DAY  . losartan (COZAAR) 100 MG tablet Take 1 tablet (100 mg total) by mouth daily.  . metolazone (ZAROXOLYN) 2.5 MG tablet Take 1 tablet (2.5 mg total) by mouth 2 (two) times a week. TAKE 1 TABLET TWICE WEEKLY ON MONDAYS AND THURSDAYS ONLY (Patient taking differently: Take 2.5 mg by mouth See admin instructions. TAKE 2.5 BY MOUTH  TWICE WEEKLY ON MONDAYS AND THURSDAYS ONLY)  . montelukast (SINGULAIR) 10 MG tablet TAKE 1 TABLET BY MOUTH EVERYDAY AT BEDTIME (Patient taking differently: Take 10 mg by mouth at bedtime. )  . Multiple Vitamins-Minerals (MULTIVITAMIN WITH MINERALS) tablet Take 1 tablet by mouth daily.  . mupirocin ointment (BACTROBAN) 2 % APPLY TO AFFECTED AREA TWICE A DAY (Patient taking differently: Apply 1 application topically 2 (two) times daily as needed (skin fold (stomach)). )  . NARCAN 4 MG/0.1ML LIQD nasal spray kit Place 1 spray into the nose as needed (accidental overdose).   . nicotine polacrilex (NICORETTE) 4 MG gum CHEW 1 EACH (4 MG TOTAL) BY MOUTH AS NEEDED FOR SMOKING CESSATION. (Patient taking differently: Take 4 mg by mouth as needed for smoking cessation. )  . NON FORMULARY Uses a C-PAP at bedtime  . nystatin (MYCOSTATIN) 100000 UNIT/ML suspension Take 5 mLs (500,000 Units total) by mouth 4 (four) times daily. Swish and swallow. (Patient taking differently: Take 5 mLs by mouth 4 (four) times daily as needed (dry  mouth). Swish and swallow.)  . Omega-3 Fatty Acids (FISH OIL) 1000 MG CAPS Take 1,000 mg by mouth daily.   . ondansetron (ZOFRAN) 4 MG tablet Take 1-2 tablets (4-8 mg total) by mouth every 8 (eight) hours as needed for nausea or vomiting.  . OXYGEN Inhale 2 L into the lungs continuous.   . potassium chloride SA (KLOR-CON M20) 20 MEQ tablet Take 1 tablet (20 mEq total) by mouth 2 (two) times a week. Take 1 tablet twice weekly on Monday and Thursday. (Patient taking differently: Take 20 mEq by mouth See admin instructions. Take 20 mEq by mouth twice weekly on Monday and Thursday.)  . promethazine (PHENERGAN) 25 MG tablet Take 1 tablet (25 mg total) by mouth every 6 (six) hours as needed for nausea.  . sertraline (ZOLOFT) 100 MG tablet Take 1 tablet (100 mg total) by mouth daily.  [name] Kitchen tiZANidine (ZANAFLEX) 4 MG tablet Take 1 tablet (4 mg total) by mouth every 8 (eight) hours as needed for muscle spasms.  . varenicline (CHANTIX CONTINUING MONTH PAK) 1 MG tablet Take 1 tablet (1 mg total) by mouth 2 (two) times daily.  [name] Kitchen omeprazole (PRILOSEC) 20 MG capsule Take 1 capsule (20 mg total) by mouth daily.  . [DISCONTINUED] atorvastatin (LIPITOR) 40 MG tablet Take 1 tablet (40 mg total) by mouth daily.  . [DISCONTINUED] CHANTIX 0.5 MG tablet TAKE 1 TABLET (0.5 MG TOTAL) BY MOUTH 2 (TWO) TIMES DAILY.  . [DISCONTINUED] diclofenac Sodium (VOLTAREN) 1 % GEL Apply 2 g topically 4 (four) times daily.  . [DISCONTINUED] loratadine (CLARITIN) 10 MG tablet TAKE 1 TABLET BY MOUTH EVERY DAY (Patient taking differently: Take 10 mg by mouth daily. )  . [DISCONTINUED] nicotine polacrilex ([employer] NICOTINE POLACRILEX) 4 MG gum Take 1 each (4 mg  total) by mouth as needed for smoking cessation.   Current Facility-Administered Medications on File Prior to Visit  Medication  . technetium tetrofosmin (TC-MYOVIEW) injection 54.6 millicurie     Allergies:   Aspirin, Methadone hcl, Corticosteroids, and Levofloxacin   Social History    Tobacco Use  . Smoking status: Current Every Day Smoker    Packs/day: 0.20    Years: 40.00    Pack years: 8.00    Types: Cigarettes    Start date: 01/28/1977  . Smokeless tobacco: Never Used  . Tobacco comment: 4-5 cigarettes per day  Substance Use Topics  . Alcohol use: No    Alcohol/week: 0.0 standard drinks  . Drug use: Not Currently    Types: Marijuana, "Crack" cocaine    Comment: hx marijuana usemany years ago, Crack -none since 11/2015    Family History: The patient's family history includes Alcohol abuse in her father; Arthritis in her sister; Asthma in her sister; Breast cancer in her maternal aunt; Depression in her sister; Diabetes in an other family member; Hypertension in her father and another family member; Pneumonia in her father; Pulmonary embolism in her mother; Sickle cell anemia in her son; Stroke in her father. no CAD known. Father died age 58 from stroke, mother died age 11 of blood clot. Has a 74 year old child with sickle cell disease.  ROS:   Please see the history of present illness.  Additional pertinent ROS otherwise unremarkable.  EKGs/Labs/Other Studies Reviewed:    The following studies were reviewed today: Stress test 04/09/19  The left ventricular ejection fraction is mildly decreased (45-54%).  Nuclear stress EF: 47%.  There was no ST segment deviation noted during stress.  This is an intermediate risk study due to reduced systolic function. There is no ischemia.  New wall motion abnormalities noted. Consider echo if clinically warranted.  Monitor 12/18/17 3 days of rhythm interpreted on Zio patch. Patient had a min HR of 58 bpm, max HR of 193 bpm, and avg HR of 81 bpm. Predominant underlying rhythm was Sinus Rhythm. 217 Supraventricular Tachycardia runs occurred, the run with the fastest interval lasting 6 beats with a max rate of 193 bpm, the longest lasting 1 min 13 secs with an avg rate of 151 bpm. True duration of Supraventricular  Tachycardia difficult to ascertain due to Artifact. Thirteen triggered patient events recorded. Supraventricular Tachycardia was detected within +/- 45 seconds of symptomatic patient event(s). Isolated SVEs were occasional (2.0%, 6813), SVE Couplets were rare (<1.0%, 235), and SVE Triplets were rare (<1.0%, 66). No significant ventricular ectopy noted.  Echo 9.4.19 Study Conclusions  - Left ventricle: The cavity size was normal. Wall thickness was   normal. Systolic function was normal. The estimated ejection   fraction was in the range of 55% to 60%. Wall motion was normal;   there were no regional wall motion abnormalities. Doppler   parameters are consistent with abnormal left ventricular   relaxation (grade 1 diastolic dysfunction).  Lexiscan MPI 11/11/17 Study Highlights    Nuclear stress EF: 71%.  The left ventricular ejection fraction is hyperdynamic (>65%).  There was no ST segment deviation noted during stress.  There is a small defect of mild severity present in the apical septal location. The defect is non-reversible. No ischemia noted.  This is a low risk study.   EKG:  EKG is personally reviewed today.  The ekg ordered 07/06/19 demonstrates normal sinus rhythm at 77 bpm  Recent Labs: 09/24/2018: B Natriuretic Peptide 41.7  04/27/2019: ALT 32; BUN 23; Creatinine, Ser 1.32; Hemoglobin 15.8; Platelets 325; Potassium 3.5; Sodium 141  Recent Lipid Panel    Component Value Date/Time   CHOL 146 09/24/2018 2336   CHOL 145 10/27/2017 0944   TRIG 165 (H) 09/24/2018 2336   HDL 37 (L) 09/24/2018 2336   HDL 55 10/27/2017 0944   CHOLHDL 3.9 09/24/2018 2336   VLDL 33 09/24/2018 2336   LDLCALC 76 09/24/2018 2336   LDLCALC 70 10/27/2017 0944   LDLDIRECT 114 (H) 09/15/2012 1559    Physical Exam:    VS:  BP 114/82   Pulse 78   Ht '5\' 10"'  (1.778 m)   Wt (!) 366 lb (166 kg)   LMP 04/10/2006   SpO2 95%   BMI 52.52 kg/m     Wt Readings from Last 3 Encounters:  07/06/19 (!)  366 lb (166 kg)  07/02/19 (!) 368 lb (166.9 kg)  06/21/19 (!) 368 lb (166.9 kg)   GEN: Well nourished, well developed in no acute distress. O2 via nasal cannula HEENT: Normal, moist mucous membranes NECK: No JVD visible CARDIAC: regular rhythm, normal S1 and S2, no rubs or gallops. No murmur. VASCULAR: Radial and DP pulses 2+ bilaterally. No carotid bruits RESPIRATORY:  Distant but clear to auscultation without rales, wheezing or rhonchi  ABDOMEN: Soft, non-tender, non-distended MUSCULOSKELETAL:  Ambulates independently SKIN: Warm and dry, chronic bilateral LE brawny edema NEUROLOGIC:  Alert and oriented x 3. No focal neuro deficits noted. PSYCHIATRIC:  Normal affect   ASSESSMENT:    1. Chronic diastolic CHF (congestive heart failure) (HCC)   2. Paroxysmal SVT (supraventricular tachycardia) ([place])   3. Morbid obesity ([place])   4. Essential hypertension   5. Pure hypercholesterolemia   6. Type 2 diabetes mellitus with stage 3 chronic kidney disease, with long-term current use of insulin, unspecified whether stage 3a or 3b CKD ([place])   7. Tobacco abuse counseling    PLAN:    Chronic diastolic heart failure:stable NYHA class III symptoms today. Weights stable. -back on baseline regimen of furosemide 80 mg BID and metolazone 2.5 mg twice weekly (with potassium) -very motivated for weight loss -very strict on diet, daily weights, monitoring for symptoms.   Paroxysmal SVT:no recent symptoms -continue diltiazem  Type II diabetes with stage 3 CKD: on insulin and jardiance -working on diet and weight loss -ultimately planning for bariatric surgery  Hypertension: at goal today -she will continue to monitor. Goal <130/80. -continue dilitazem, diuretics, losartan  Hypercholesterolemia: continue atorvastatin, lipids rechecked in hospital 08/2018  Tobacco cessation counseling: The patient was counseled on tobacco cessation today for 4 minutes.  Counseling included reviewing the  risks of smoking tobacco products, how it impacts the patient's current medical diagnoses and different strategies for quitting.  Pharmacotherapy to aid in tobacco cessation was not prescribed today.  Class 3 obesity, BMI 52: has done a great job losing weight on her own, I would fully support her if she is a candidate for bariatric surgery  OSA on PAP, obesity hypoventilation syndrome, chronic hypoxemic respiratory failure on oxygen: followed by pulmonology  Prevention and cardiac disease counseling, including heart failure education:   The 10-year ASCVD risk score Mikey Bussing DC Jr., et al., 2013) is: 16.2%   Values used to calculate the score:     Age: 31 years     Sex: Female     Is Non-Hispanic African American: Yes     Diabetic: Yes     Tobacco smoker: Yes  Systolic Blood Pressure: 051 mmHg     Is BP treated: Yes     HDL Cholesterol: 37 mg/dL     Total Cholesterol: 146 mg/dL  Plan for follow up: 6 mos  [name], MD, PhD [place]  [facility]   Medication Adjustments/Labs and Tests Ordered: Current medicines are reviewed at length with the patient today.  Concerns regarding medicines are outlined above.  Orders Placed This Encounter  Procedures  . EKG 12-Lead   No orders of the defined types were placed in this encounter.   Patient Instructions  Medication Instructions:  Your Physician recommend you continue on your current medication as directed.    *If you need a refill on your cardiac medications before your next appointment, please call your pharmacy*   Lab Work: None   Testing/Procedures: None   Follow-Up: At [facility], you and your health needs are our priority.  As part of our continuing mission to provide you with exceptional heart care, we have created designated Provider Care Teams.  These Care Teams include your primary Cardiologist (physician) and Advanced Practice Providers (APPs -  Physician Assistants and Nurse Practitioners)  who all work together to provide you with the care you need, when you need it.  We recommend signing up for the patient portal called "MyChart".  Sign up information is provided on this After Visit Summary.  MyChart is used to connect with patients for Virtual Visits (Telemedicine).  Patients are able to view lab/test results, encounter notes, upcoming appointments, etc.  Non-urgent messages can be sent to your provider as well.   To learn more about what you can do with MyChart, go to [email or web address].    Your next appointment:   6 month(s)  The format for your next appointment:   In Person  Provider:   [name], MD        Signed, [name], MD PhD 07/06/2019  [place]

## 2019-07-06 NOTE — Patient Instructions (Signed)

## 2019-07-07 ENCOUNTER — Ambulatory Visit: Payer: Self-pay | Admitting: Licensed Clinical Social Worker

## 2019-07-07 NOTE — Chronic Care Management (AMB) (Signed)
Social Work  Care Management Collaboration 07/07/2019 Name: [name] MRN: [id number] DOB: 06/11/1965 [name] is a 53 y.o. year old female who sees Cresenzo, Victor, MD for primary care. LCSW was consulted to assistance patient with  Level of Care Concerns to assist with personal care service referral.   Intervention: Patient was not interviewed or contacted during this encounter.  LCSW collaborated with [facility]. Plan: LCSW will F/U with in two weeks  Review of patient status, including review of consultants reports, relevant laboratory and other test results, and collaboration with appropriate care team members and the patient's provider was performed as part of comprehensive patient evaluation and provision of chronic care management services.    Goals Addressed            This Visit's Progress   . Personal Care Services   On track    CARE PLAN ENTRY (see longitudinal plan of care for additional care plan information)  Current Barriers & progress:   . Patient needs Support, Education, and Care Coordination to resolve unmet Personal Care needs . [name] Referral received and processed by [facility] . [name] Assessment also completed June 1st Clinical Social Work Goal(s):  . Over the next 30 to 45 days, patient will have personal care needs met as evident by having [name] Aide in the home assisting with needs. Interventions provided by LCSW : . Collaborate with [facility] ref  [name] referral  Patient Self Care Activities & Deficits:  . Patient is unable to perform ADLs independently without assistance  . Family/support system will assist patient with meeting needs until [name] is approved . Patient states she will call [facility] with questions [phone] or [phone] and will contact LCSW if needed Please see past updates related to this goal by clicking on the "Past Updates" button in the selected goal       , LCSW Chronic Care  Coordination  [facility]   [phone] 8:56 AM      

## 2019-07-11 ENCOUNTER — Encounter: Payer: Self-pay | Admitting: Family Medicine

## 2019-07-12 ENCOUNTER — Other Ambulatory Visit: Payer: Self-pay | Admitting: Urology

## 2019-07-12 ENCOUNTER — Ambulatory Visit: Payer: Medicare Other | Attending: Orthopaedic Surgery | Admitting: Physical Therapy

## 2019-07-12 ENCOUNTER — Encounter: Payer: Self-pay | Admitting: Physical Therapy

## 2019-07-12 ENCOUNTER — Other Ambulatory Visit: Payer: Self-pay

## 2019-07-12 DIAGNOSIS — M6281 Muscle weakness (generalized): Secondary | ICD-10-CM | POA: Diagnosis not present

## 2019-07-12 DIAGNOSIS — Z9889 Other specified postprocedural states: Secondary | ICD-10-CM | POA: Insufficient documentation

## 2019-07-12 DIAGNOSIS — M25512 Pain in left shoulder: Secondary | ICD-10-CM

## 2019-07-12 DIAGNOSIS — R29898 Other symptoms and signs involving the musculoskeletal system: Secondary | ICD-10-CM | POA: Diagnosis not present

## 2019-07-12 NOTE — Patient Instructions (Signed)
Access Code: [email or web address] URL: [email or web address] Date: 07/12/2019 Prepared by: Hessie Diener  Exercises Seated Single Arm Shoulder Flexion with Dumbbells - 1 x daily - 7 x weekly - 10 reps - 3 sets Seated Single Arm Shoulder Abduction with Dumbbell - Thumb Up - 1 x daily - 7 x weekly - 10 reps - 3 sets Seated Overhead Press with Dumbbells - 1 x daily - 7 x weekly - 10 reps - 3 sets

## 2019-07-12 NOTE — Therapy (Signed)
[place], [place], [address] Phone: [phone]   Fax:  [phone]  Physical Therapy Treatment  Patient Details  Name: [name] MRN: [id number] Date of Birth: 10-13-1965 Referring Provider (PT): Dr. Erlinda Hong    Encounter Date: 07/12/2019   PT End of Session - 07/12/19 1028    Visit Number 4    Number of Visits 16    Date for PT Re-Evaluation 07/19/19    PT Start Time 1025   arrived late and then went to restroom   PT Stop Time 1058    PT Time Calculation (min) 33 min           Past Medical History:  Diagnosis Date   Alcoholism ([place])    Anxiety    Arthritis 04-10-11   hips, shoulders, back   Asthma    Bipolar disorder ([place])    Cardiomegaly    Cervical cancer ([place]) 1993   cervical, no treatment done, went away per pt   Cervical dysplasia or atypia 04-10-11   '93- once dx.-got pregnant-no intervention, then postpartum, no dysplasia found   CHF (congestive heart failure) ([place])    no cardiologist 2014 dx, none now   Chronic hypoxemic respiratory failure ([place])    Chronic kidney disease    stage 3 kidney disease   Condyloma - gluteal cleft 04/09/2011   Removed by general surgery. Pathology showed Condyloma, gluteal CONDYLOMA ACUMINATUM.    COPD (chronic obstructive pulmonary disease) ([place])    Depression    Diabetes mellitus without complication ([place])    Dyspnea    with actity, sitting   Fall 12/16/2016   Fibrocystic breast disease    GERD (gastroesophageal reflux disease)    Grade I diastolic dysfunction 82/50/5397   Noted on ECHO   Hepatitis    hep B-count is low at present,doesn't register   History of PSVT (paroxysmal supraventricular tachycardia)    Hypercalcemia    Hyperlipidemia    Hypertension    Incomplete left bundle branch block (LBBB) 09/24/2018   Noted on EKG   Lung nodule 11/2017   right and left lung   Migraine headache    none recent    Morbid obesity ([place]) 03/27/2006   Neuropathy    Pneumonia    walking pneu 15 yrs. ago   Skin lesion 03/15/2011   In gluteal crease now s/p removal by Dr. [name] of General Surgery on 3/12. Path shows condyloma.      Sleep apnea 04-10-11   uses cpap, pt does not know settings   Trigger finger    left third   Urge incontinence of urine     Past Surgical History:  Procedure Laterality Date   ANAL FISTULECTOMY  04/17/2011   Procedure: FISTULECTOMY ANAL;  Surgeon: Imogene Burn. [name], MD;  Location: [facility];  Service: General;  Laterality: N/A;  Excision of Condyloma Gluteal Cleft    ANAL RECTAL MANOMETRY N/A 03/26/2019   Procedure: ANO RECTAL MANOMETRY;  Surgeon: Thornton Park, MD;  Location: [facility];  Service: Gastroenterology;  Laterality: N/A;   BIOPSY  03/09/2019   Procedure: BIOPSY;  Surgeon: Thornton Park, MD;  Location: [facility];  Service: Gastroenterology;;   BOTOX INJECTION N/A 12/29/2018   Procedure: BOTOX INJECTION(100 UNITS)  WITH CYSTOSCOPY;  Surgeon: Ceasar Mons, MD;  Location: [facility];  Service: Urology;  Laterality: N/A;   BREAST CYST EXCISION     bilateral breast, 3 cysts removed from each breast   BREAST EXCISIONAL  BIOPSY Right    x 3   BREAST EXCISIONAL BIOPSY Left    x 3   CARPAL TUNNEL RELEASE Bilateral    Cervical biospy     CERVICAL FUSION  04-10-11   x3- cervical fusion with plating and screws-Dr. [name]   COLONOSCOPY WITH PROPOFOL N/A 07/06/2015   Procedure: COLONOSCOPY WITH PROPOFOL;  Surgeon: [name], MD;  [facility]: [facility];  Service: Endoscopy;  Laterality: N/A;   COLONOSCOPY WITH PROPOFOL N/A 03/09/2019   Procedure: COLONOSCOPY WITH PROPOFOL;  Surgeon: Thornton Park, MD;  Location[facility];  Service: Gastroenterology;  Laterality: N/A;   DOPPLER ECHOCARDIOGRAPHY  04/06/2012   AT [place] 55-60%   FOOT SURGERY Left 2018   FRACTURE SURGERY     little toe left foot   I & D EXTREMITY Left  12/18/2016   Procedure: IRRIGATION AND DEBRIDEMENT LEFT FOOT, CLOSURE;  Surgeon: [name], MD;  Location: [place];  Service: Orthopedics;  Laterality: Left;   LUMBAR [place] SURGERY  04-10-11   x5-Lumbar fusion-retained hardware.(Dr. Louanne Skye)   MULTIPLE TOOTH EXTRACTIONS     POLYPECTOMY  03/09/2019   Procedure: POLYPECTOMY;  Surgeon: Thornton Park, MD;  [facility];  Service: Gastroenterology;;   SHOULDER ARTHROSCOPY WITH ROTATOR CUFF REPAIR AND SUBACROMIAL DECOMPRESSION Left 05/07/2019   Procedure: LEFT SHOULDER ARTHROSCOPY WITH DEBRIDEMENT, DISTAL CLAVICLE EXCISION,  SUBACROMIAL DECOMPRESSION AND POSSIBLE ROTATOR CUFF REPAIR;  Surgeon: [name], MD;  Location: [facility];  Service: Orthopedics;  Laterality: Left;   TEE WITHOUT CARDIOVERSION N/A 04/06/2012   Procedure: TRANSESOPHAGEAL ECHOCARDIOGRAM (TEE);  Surgeon: [name], MD;  Location[facility];  Service: Cardiovascular;  Laterality: N/A;   TRIGGER FINGER RELEASE Right    middle finger   TRIGGER FINGER RELEASE Left 06/28/2016   Procedure: RELEASE TRIGGER FINGER LEFT 3RD FINGER;  Surgeon: [name], MD;  Location: [place];  Service: Orthopedics;  Laterality: Left;   TRIGGER FINGER RELEASE Right 06/12/2016   Procedure: RIGHT INDEX FINGER TRIGGER RELEASE;  Surgeon: [name], MD;  Location: [facility];  Service: Orthopedics;  Laterality: Right;   TRIGGER FINGER RELEASE Right 01/19/2018   Procedure: RIGHT RING FINGER TRIGGER FINGER RELEASE;  Surgeon: [name], MD;  Location: [facility];  Service: Orthopedics;  Laterality: Right;   TRIGGER FINGER RELEASE Left 05/07/2019   Procedure: RELEASE TRIGGER FINGER LEFT INDEX FINGER;  Surgeon: [name], MD;  Location: [place];  Service: Orthopedics;  Laterality: Left;    There were no vitals filed for this visit.   Subjective Assessment - 07/12/19 1027    Subjective The shoulder has come a long way.    Currently in Pain? No/denies   no shoulder pain              [facility] PT Assessment - 07/12/19 0001      AROM   Left Shoulder Flexion 138 Degrees   138 abduction , touches back of neck , reaches lumbar     Strength   Left Shoulder Flexion 4-/5    Left Shoulder ABduction 3+/5    Left Shoulder Internal Rotation 4+/5    Left Shoulder External Rotation 4/5                         [facility] Adult PT Treatment/Exercise - 07/12/19 0001      Shoulder Exercises: Seated   Extension Strengthening;Both;15 reps;Theraband    Theraband Level (Shoulder Extension) Level 3 (Green)  Row Strengthening;Both;15 reps;Theraband    Theraband Level (Shoulder Row) Level 3 (Green)    External Rotation Limitations red band x 20     Internal Rotation Limitations red band x 20     Flexion Limitations punch with red band, over head press with 1#    Other Seated Exercises seated forward raise, lateral raise and scaption 1# to fatigue each way     Other Seated Exercises bicep curls 5# x 20 left only                   PT Education - 07/12/19 1132    Education Details HEP    Person(s) Educated Patient    Methods Explanation;Handout    Comprehension Verbalized understanding            PT Short Term Goals - 06/22/19 1208      PT SHORT TERM GOAL #1   Title Pt will be I with HEP for strength and ROM within limits of surgical protocol    Baseline contniues to develop HEP, began isometrics and genttle strength today    Status On-going      PT SHORT TERM GOAL #2   Title Pt will be able to improve transfers in the clinic with mod I (stand-pivot and bed mobility)    Baseline min A to supervision due to knee pain    Status On-going      PT SHORT TERM GOAL #3   Title Pt will be able to use L UE to assist with ADLs below shoulder height with min increase in pain    Baseline using arm for lower body dressing as tolerance    Status On-going      PT SHORT TERM GOAL #4   Title Pt will improve ability to make a fist with improved comfort, limited in finger  flexion 50%    Status Deferred             PT Long Term Goals - 05/24/19 1718      PT LONG TERM GOAL #1   Title Pt will be I with more advanced HEP for L shoulder ROM and strength    Time 8    Period Weeks    Status New    Target Date 07/19/19      PT LONG TERM GOAL #2   Title Pt willl be able to increase L UE strength in shoulder flexion and abduction to 4/5 for mobility tasks.    Baseline 2+/5 estimated    Time 8    Period Weeks    Status New    Target Date 07/19/19      PT LONG TERM GOAL #3   Title Pt will be able to walk with walker and min shoulder pain short distances (300 feet)    Baseline uses walker    Time 8    Period Weeks    Status New    Target Date 07/19/19      PT LONG TERM GOAL #4   Title Pt will demonstrate full AROM in sitting in functional reach (ER and IR) with min discomfort    Baseline passive ROM in ER 70 deg and IR 70 deg (did not measure functional reach due to protocol)    Period Weeks    Status New    Target Date 07/19/19      PT LONG TERM GOAL #5   Title FOTO score TBA  Plan - 07/12/19 1204    Clinical Impression Statement Pt reports shoulder is coming along well. She demonstrates ROM within functional limits except external reaching behind head. Available treatment time spent with strengthening and progressing HEP. She used clinic WC today and reports new onset of pulled muscle in right upper back.    PT Next Visit Plan shoulder strengthening , AROM, sit-stand    PT Home Exercise Plan table slides, pendulums and scap squeeze, added isometrics and supine cane pressups and pullovers, added 1# forward raise, lateral raise and overhead press, gave green band for ER           Patient will benefit from skilled therapeutic intervention in order to improve the following deficits and impairments:  Abnormal gait, Decreased range of motion, Difficulty walking, Obesity, Impaired UE functional use, Decreased endurance,  Cardiopulmonary status limiting activity, Decreased activity tolerance, Pain, Impaired flexibility, Decreased balance, Decreased strength, Decreased mobility, Postural dysfunction  Visit Diagnosis: Muscle weakness (generalized)  Acute pain of left shoulder  S/P left rotator cuff repair  Other symptoms and signs involving the musculoskeletal system     Problem List Patient Active Problem List   Diagnosis Date Noted   Body mass index 50.0-59.9, adult ([place]) 06/12/2019   Constipation due to outlet dysfunction    Impingement syndrome of left shoulder 05/07/2019   Polyp of colon    Polyp of ascending colon    Rectal discomfort 02/12/2019   Osteoarthritis of right hip 02/12/2019   Nontraumatic tear of left supraspinatus tendon 12/30/2018   Gout 12/17/2018   Nontraumatic complete tear of right rotator cuff 12/08/2018   Rotator cuff tear, right 12/01/2018   Chest pain 09/24/2018   Right groin pain 07/20/2018   Depressed mood 07/13/2018   Mouth pain 06/02/2018   Paroxysmal SVT (supraventricular tachycardia) (H[facility]) 03/20/2018   Pure hypercholesterolemia 03/20/2018   Muscle cramping 02/25/2018   Nodule of upper lobe of right lung 02/13/2018   Falls, subsequent encounter 01/30/2018   Pain in left foot 11/04/2017   Open wound of left foot 11/04/2017   Essential hypertension 10/27/2017   Solitary pulmonary nodule 10/07/2017   Restrictive lung disease secondary to obesity 10/01/2017   Polycythemia, secondary 10/01/2017   Chronic viral hepatitis B without delta-agent ([place]) 07/08/2017   COPD (chronic obstructive pulmonary disease) with chronic bronchitis ([place]) 06/27/2017   Chronic kidney disease (CKD), stage IV (severe) ([place]) 01/14/2017   Type 2 diabetes mellitus with stage 3 chronic kidney disease, with long-term current use of insulin ([place]) 12/12/2016   Urge incontinence of urine 12/05/2016   Trigger finger, left index finger 07/15/2016   Back pain  04/07/2014   Morbid obesity ([place]) 10/02/2012   Chronic diastolic CHF (congestive heart failure) ([place]) 04/07/2012   GERD 01/26/2010   Obstructive sleep apnea treated with BiPAP 02/03/2008   FIBROCYSTIC BREAST DISEASE 10/28/2006   Hyperlipidemia 03/27/2006   Obesity hypoventilation syndrome ([place]) 03/27/2006   Former tobacco use 03/27/2006   Depression with anxiety 03/27/2006    Dorene Ar, PTA 07/12/2019, 12:09 PM  [facility] [address] [place], [place], [address] Phone: [phone]   Fax:  [phone]  Name: [name] MRN: [id number] Date of Birth: Nov 27, 1965

## 2019-07-13 DIAGNOSIS — G4733 Obstructive sleep apnea (adult) (pediatric): Secondary | ICD-10-CM | POA: Diagnosis not present

## 2019-07-13 DIAGNOSIS — M5090 Cervical disc disorder, unspecified, unspecified cervical region: Secondary | ICD-10-CM | POA: Diagnosis not present

## 2019-07-14 ENCOUNTER — Ambulatory Visit: Payer: Medicare Other | Admitting: Physical Therapy

## 2019-07-15 ENCOUNTER — Ambulatory Visit: Payer: Medicare Other | Admitting: Licensed Clinical Social Worker

## 2019-07-15 ENCOUNTER — Other Ambulatory Visit: Payer: Self-pay

## 2019-07-15 DIAGNOSIS — Z7189 Other specified counseling: Secondary | ICD-10-CM

## 2019-07-15 NOTE — Chronic Care Management (AMB) (Signed)
Care Management   Clinical Social Work Follow Up   3/[id number]/6734 Name: [name] MRN: [id number] DOB: [birth year] Referred by: [name], MD  Reason for referral : Care Coordination ([name] )  [name] is a 54 y.o. year old female who is a primary care patient of [name], MD.  Reason for follow-up: assess for barriers and progress with personal care services .   Assessment: Patient has services in place with no concerns. New goal set with advance directives Plan:  1. Patient will review mailed advance directives 2.  LCSW will F/U in one to two weeks Advance Directive Status: [place] and [place] application for related entries. ;   SDOH (Social Determinants of Health) assessments performed: ; No needs identified   Goals Addressed            This Visit's Progress   . Advance Directives       CARE PLAN ENTRY (see longitudinal plan of care for additional care plan information)  Current Barriers:  . Patient does not have an Forensic scientist . Patient acknowledges deficits, education and support in order to complete this document Clinical Social Work Goal(s): Over the next 45 days, the patient will  . review EMMI education on Advance Directive as evidenced by patient self report of review Interventions provided by LCSW: . Assessed understanding of Advance Directives . A voluntary discussion about advanced care planning including importance of advanced directives, healthcare proxy and living will was discussed with the patient  . Mailed an Forensic scientist packet Patient Self Care Activities:  . Able to identify [place] . patient will review information mailed by LCSW . complete Advance Directive packet, have notarized and provide a copy to provider office Initial goal documentation    . COMPLETED: Personal Care Services       CARE PLAN ENTRY (see longitudinal plan of care for additional care plan  information)  Current Barriers & progress:   . Patient needs Support, Education, and Care Coordination to resolve unmet Personal Care needs . [name] services have started and things are going well Clinical Social Work Goal(s):  [name] Kitchen Over the next 30 to 45 days, patient will have personal care needs met as evident by having [name] Aide in the home assisting with needs. Interventions provided by LCSW : . Assessed patient's needs and barriers with care Patient Self Care Activities & Deficits:  . Patient is unable to perform ADLs independently without assistance  . Family/support system will assist patient with meeting needs until [name] is approved Please see past updates related to this goal by clicking on the "Past Updates" button in the selected goal       Outpatient Encounter Medications as of 07/15/2019  Medication Sig  . Accu-Chek Softclix Lancets lancets TEST 4 TIMES DAILY  . acetaminophen (TYLENOL) 500 MG tablet Take 1,000 mg by mouth 2 (two) times daily.   [name] Kitchen albuterol (VENTOLIN HFA) 108 (90 Base) MCG/ACT inhaler Inhale 2 puffs into the lungs every 6 (six) hours as needed for wheezing or shortness of breath.  [name] Kitchen ammonium lactate (LAC-HYDRIN) 12 % lotion APPLY TWICE A DAY AS NEEDED FOR DRY SKIN (Patient taking differently: Apply 1 application topically in the morning and at bedtime. )  . atorvastatin (LIPITOR) 40 MG tablet TAKE 1 TABLET BY MOUTH EVERY DAY (Patient taking differently: Take 40 mg by mouth daily. )  . b complex vitamins capsule Take 1 capsule by mouth daily.  [name] Kitchen  baclofen (LIORESAL) 10 MG tablet Take 1 tablet (10 mg total) by mouth 3 (three) times daily as needed for muscle spasms. (Patient taking differently: Take 10 mg by mouth 3 (three) times daily. )  . blood glucose meter kit and supplies KIT Dispense based on patient and insurance preference. Use up to four times daily as directed. (FOR ICD-9 250.00, 250.01).  . busPIRone (BUSPAR) 10 MG tablet TAKE 1 TABLET BY MOUTH THREE TIMES A DAY  (Patient taking differently: Take 10 mg by mouth 3 (three) times daily. )  . diclofenac Sodium (VOLTAREN) 1 % GEL APPLY 2 GRAMS TO AFFECTED AREA 4 TIMES A DAY (Patient taking differently: Apply 2 g topically 4 (four) times daily. )  . diltiazem (CARDIZEM CD) 120 MG 24 hr capsule Take 1 capsule (120 mg total) by mouth 2 (two) times daily.  [name] Kitchen docusate sodium (COLACE) 100 MG capsule Take 200 mg by mouth 2 (two) times daily.  . empagliflozin (JARDIANCE) 25 MG TABS tablet Take 25 mg by mouth daily.  . febuxostat (ULORIC) 40 MG tablet Take 40 mg by mouth at bedtime.   . fluticasone (FLONASE) 50 MCG/ACT nasal spray Place 2 sprays into both nostrils 2 (two) times daily.  . Fluticasone-Umeclidin-Vilant (TRELEGY ELLIPTA) 100-62.5-25 MCG/INH AEPB Inhale 1 puff into the lungs daily.  . furosemide (LASIX) 40 MG tablet TAKE 2 TABLETS (80 MG TOTAL) BY MOUTH 2 (TWO) TIMES DAILY.  [name] Kitchen gabapentin (NEURONTIN) 300 MG capsule TAKE 2 CAPSULES (600 MG TOTAL) BY MOUTH 3 (THREE) TIMES DAILY.  [name] Kitchen glucose blood (ACCU-CHEK AVIVA PLUS) test strip 1 each by Other route 3 (three) times daily. TEST 3 TIMES DAILY  . HYDROcodone-acetaminophen (NORCO) 7.5-325 MG tablet Take 1 tablet by mouth every 6 (six) hours as needed for moderate pain. (Patient taking differently: Take 1 tablet by mouth in the morning, at noon, in the evening, and at bedtime. )  . hydrOXYzine (ATARAX/VISTARIL) 10 MG tablet TAKE 1 TABLET BY MOUTH EVERY DAY (Patient taking differently: Take 10 mg by mouth daily. )  . insulin glargine (LANTUS SOLOSTAR) 100 UNIT/ML Solostar Pen Inject 20 Units into the skin daily before breakfast.  . insulin lispro (HUMALOG KWIKPEN) 100 UNIT/ML KwikPen Inject 0.08 mLs (8 Units total) into the skin 3 (three) times daily before meals.  . Insulin Pen Needle (B-D UF III MINI PEN NEEDLES) 31G X 5 MM MISC CHECK SUGARS 4 TIMES A DAY BEFORE MEALS AND AT BEDTIME.  [name] Kitchen ketoconazole (NIZORAL) 2 % cream APPLY 1 FINGERTIP AMOUNT TO EACH FOOT DAILY.  (Patient taking differently: Apply 1 application topically daily. )  . loratadine (CLARITIN) 10 MG tablet TAKE 1 TABLET BY MOUTH EVERY DAY (Patient taking differently: Take 10 mg by mouth daily. )  . losartan (COZAAR) 100 MG tablet Take 1 tablet (100 mg total) by mouth daily.  . metolazone (ZAROXOLYN) 2.5 MG tablet Take 1 tablet (2.5 mg total) by mouth 2 (two) times a week. TAKE 1 TABLET TWICE WEEKLY ON MONDAYS AND THURSDAYS ONLY (Patient taking differently: Take 2.5 mg by mouth See admin instructions. TAKE 2.5 BY MOUTH  TWICE WEEKLY ON MONDAYS AND THURSDAYS ONLY)  . montelukast (SINGULAIR) 10 MG tablet TAKE 1 TABLET BY MOUTH EVERYDAY AT BEDTIME (Patient taking differently: Take 10 mg by mouth at bedtime. )  . Multiple Vitamins-Minerals (MULTIVITAMIN WITH MINERALS) tablet Take 1 tablet by mouth daily.  . mupirocin ointment (BACTROBAN) 2 % APPLY TO AFFECTED AREA TWICE A DAY (Patient taking differently: Apply 1 application topically 2 (two)  times daily as needed (skin fold (stomach)). )  . NARCAN 4 MG/0.1ML LIQD nasal spray kit Place 1 spray into the nose as needed (accidental overdose).   . nicotine polacrilex (NICORETTE) 4 MG gum CHEW 1 EACH (4 MG TOTAL) BY MOUTH AS NEEDED FOR SMOKING CESSATION. (Patient taking differently: Take 4 mg by mouth in the morning and at bedtime. )  . NON FORMULARY Uses a C-PAP at bedtime  . nystatin (MYCOSTATIN) 100000 UNIT/ML suspension Take 5 mLs (500,000 Units total) by mouth 4 (four) times daily. Swish and swallow. (Patient taking differently: Take 5 mLs by mouth 4 (four) times daily as needed (dry mouth). Swish and swallow.)  . Omega-3 Fatty Acids (FISH OIL) 1000 MG CAPS Take 1,000 mg by mouth daily.   [name] Kitchen omeprazole (PRILOSEC) 20 MG capsule Take 1 capsule (20 mg total) by mouth daily.  . OXYGEN Inhale 2 L into the lungs continuous.   . potassium chloride SA (KLOR-CON M20) 20 MEQ tablet Take 1 tablet (20 mEq total) by mouth 2 (two) times a week. Take 1 tablet twice weekly  on Monday and Thursday. (Patient taking differently: Take 20 mEq by mouth See admin instructions. Take 20 mEq by mouth twice weekly on Monday and Thursday.)  . sertraline (ZOLOFT) 100 MG tablet Take 1 tablet (100 mg total) by mouth daily.  [name] Kitchen tiZANidine (ZANAFLEX) 4 MG tablet Take 1 tablet (4 mg total) by mouth every 8 (eight) hours as needed for muscle spasms. (Patient not taking: Reported on 07/13/2019)  . varenicline (CHANTIX CONTINUING MONTH PAK) 1 MG tablet Take 1 tablet (1 mg total) by mouth 2 (two) times daily.  . [DISCONTINUED] atorvastatin (LIPITOR) 40 MG tablet Take 1 tablet (40 mg total) by mouth daily.  . [DISCONTINUED] CHANTIX 0.5 MG tablet TAKE 1 TABLET (0.5 MG TOTAL) BY MOUTH 2 (TWO) TIMES DAILY.  . [DISCONTINUED] diclofenac Sodium (VOLTAREN) 1 % GEL Apply 2 g topically 4 (four) times daily.  . [DISCONTINUED] loratadine (CLARITIN) 10 MG tablet TAKE 1 TABLET BY MOUTH EVERY DAY (Patient taking differently: Take 10 mg by mouth daily. )  . [DISCONTINUED] nicotine polacrilex ([employer] NICOTINE POLACRILEX) 4 MG gum Take 1 each (4 mg total) by mouth as needed for smoking cessation.   Facility-Administered Encounter Medications as of 07/15/2019  Medication  . technetium tetrofosmin (TC-MYOVIEW) injection 93.7 millicurie    Review of patient status, including review of consultants reports, relevant laboratory and other test results, and collaboration with appropriate care team members and the patient's provider was performed as part of comprehensive patient evaluation and provision of care management services.     [name], [place] / [place]   [phone] 2:44 PM

## 2019-07-19 ENCOUNTER — Other Ambulatory Visit: Payer: Self-pay | Admitting: Family Medicine

## 2019-07-20 ENCOUNTER — Encounter: Payer: Self-pay | Admitting: Podiatry

## 2019-07-20 ENCOUNTER — Ambulatory Visit (INDEPENDENT_AMBULATORY_CARE_PROVIDER_SITE_OTHER): Payer: Medicare Other | Admitting: Podiatry

## 2019-07-20 ENCOUNTER — Other Ambulatory Visit: Payer: Self-pay

## 2019-07-20 ENCOUNTER — Telehealth: Payer: Self-pay | Admitting: *Deleted

## 2019-07-20 DIAGNOSIS — E1151 Type 2 diabetes mellitus with diabetic peripheral angiopathy without gangrene: Secondary | ICD-10-CM

## 2019-07-20 DIAGNOSIS — M79674 Pain in right toe(s): Secondary | ICD-10-CM | POA: Diagnosis not present

## 2019-07-20 DIAGNOSIS — B351 Tinea unguium: Secondary | ICD-10-CM | POA: Diagnosis not present

## 2019-07-20 DIAGNOSIS — B353 Tinea pedis: Secondary | ICD-10-CM

## 2019-07-20 DIAGNOSIS — M79675 Pain in left toe(s): Secondary | ICD-10-CM | POA: Diagnosis not present

## 2019-07-20 DIAGNOSIS — R8279 Other abnormal findings on microbiological examination of urine: Secondary | ICD-10-CM | POA: Diagnosis not present

## 2019-07-20 DIAGNOSIS — E1142 Type 2 diabetes mellitus with diabetic polyneuropathy: Secondary | ICD-10-CM

## 2019-07-20 NOTE — Patient Instructions (Addendum)
DUE TO COVID-19 ONLY ONE VISITOR ARE ALLOWED TO COME WITH YOU AND STAY IN THE WAITING ROOM ONLY DURING PRE OP AND PROCEDURE. THEN TWO VISITORS MAY VISIT WITH YOU IN YOUR PRIVATE ROOM DURING VISITING HOURS ONLY!!   COVID SWAB TESTING MUST BE COMPLETED ON:  Friday, July 23, 2019 at [place] enter pre surgical testing line (Must self quarantine after testing. Follow instructions on handout.)             Your procedure is scheduled on: Tuesday, July 27, 2019   Report to [facility] Main  Entrance    Report to admitting at 10:00 AM   Call this number if you have problems the morning of surgery [phone]   Do not eat food:After Midnight.   May have liquids until 9:00 AM day of surgery   CLEAR LIQUID DIET  Foods Allowed                                                                     Foods Excluded  Water, Black Coffee and tea, regular and decaf                             liquids that you cannot  Plain Jell-O in any flavor  (No red)                                           see through such as: Fruit ices (not with fruit pulp)                                     milk, soups, orange juice  Iced Popsicles (No red)                                    All solid food                                   Apple juices Sports drinks like Gatorade (No red) Lightly seasoned clear broth or consume(fat free) Sugar, honey syrup  Sample Menu Breakfast                                Lunch                                     Supper Cranberry juice                    Beef broth                            Chicken broth Jell-O  Grape juice                           Apple juice Coffee or tea                        Jell-O                                      Popsicle                                                Coffee or tea                        Coffee or tea   Oral Hygiene is also important to  reduce your risk of infection.                                    Remember - BRUSH YOUR TEETH THE MORNING OF SURGERY WITH YOUR REGULAR TOOTHPASTE   The day before surgery do not take Jardiance   Do NOT smoke after Midnight   Take these medicines the morning of surgery with A SIP OF WATER:  Tylenol, Atorvastatin, Buspirone, Diltiazem, Gabapentin, Hydroxyzine, Loratadine, Omeprazole, Sertraline   *Take ONLY Lantus 10 units the morning of surgery*   Use Inhaler and nasal spray the morning of surgery   Bring Asthma Inhaler the day of surgery    Bring CPAP mask and tubing day of surgery  DO NOT TAKE ANY ORAL DIABETIC MEDICATIONS DAY OF YOUR SURGERY                               You may not have any metal on your body including hair pins, jewelry, and body piercings             Do not wear make-up, lotions, powders, perfumes/cologne, or deodorant             Do not wear nail polish.  Do not shave  48 hours prior to surgery.            Do not bring valuables to the hospital. [place].   Contacts, dentures or bridgework may not be worn into surgery.    Patients discharged the day of surgery will not be allowed to drive home.   Special Instructions: Bring a copy of your healthcare power of attorney and living will documents         the day of surgery if you haven't scanned them in before.              Please read over the following fact sheets you were given: IF YOU HAVE QUESTIONS ABOUT YOUR PRE OP [place] [phone]  How to Manage Your Diabetes Before and After Surgery  Why is it important to control my blood sugar before and after surgery? . Improving blood sugar levels before and after surgery helps healing and can limit problems. [name] Kitchen  A way of improving blood sugar control is eating a healthy diet by: o  Eating less sugar and carbohydrates o  Increasing activity/exercise o  Talking with your doctor about reaching  your blood sugar goals . High blood sugars (greater than 180 mg/dL) can raise your risk of infections and slow your recovery, so you will need to focus on controlling your diabetes during the weeks before surgery. . Make sure that the doctor who takes care of your diabetes knows about your planned surgery including the date and location.  How do I manage my blood sugar before surgery? . Check your blood sugar at least 4 times a day, starting 2 days before surgery, to make sure that the level is not too high or low. o Check your blood sugar the morning of your surgery when you wake up and every 2 hours until you get to the Short Stay unit. . If your blood sugar is less than 70 mg/dL, you will need to treat for low blood sugar: o Do not take insulin. o Treat a low blood sugar (less than 70 mg/dL) with  cup of clear juice (cranberry or apple), 4 glucose tablets, OR glucose gel. o Recheck blood sugar in 15 minutes after treatment (to make sure it is greater than 70 mg/dL). If your blood sugar is not greater than 70 mg/dL on recheck, call [phone] for further instructions. . Report your blood sugar to the short stay nurse when you get to Short Stay.  . If you are admitted to the hospital after surgery: o Your blood sugar will be checked by the staff and you will probably be given insulin after surgery (instead of oral diabetes medicines) to make sure you have good blood sugar levels. o The goal for blood sugar control after surgery is 80-180 mg/dL.   WHAT DO I DO ABOUT MY DIABETES MEDICATION?  [name] Kitchen Do not take oral diabetes medicines (pills) the morning of surgery.  . THE DAY BEFORE SURGERY, DO NOT TAKE JARDIANCE      . THE MORNING OF SURGERY, take 10  units of   LANTUS      insulin.  Reviewed and Endorsed by [facility] Patient Education Committee, August 2015   [facility] - Preparing for Surgery Before surgery, you can play an important role.  Because skin is not sterile, your skin  needs to be as free of germs as possible.  You can reduce the number of germs on your skin by washing with CHG (chlorahexidine gluconate) soap before surgery.  CHG is an antiseptic cleaner which kills germs and bonds with the skin to continue killing germs even after washing. Please DO NOT use if you have an allergy to CHG or antibacterial soaps.  If your skin becomes reddened/irritated stop using the CHG and inform your nurse when you arrive at Short Stay. Do not shave (including legs and underarms) for at least 48 hours prior to the first CHG shower.  You may shave your face/neck.  Please follow these instructions carefully:  1.  Shower with CHG Soap the night before surgery and the  morning of surgery.  2.  If you choose to wash your hair, wash your hair first as usual with your normal  shampoo.  3.  After you shampoo, rinse your hair and body thoroughly to remove the shampoo.  4.  Use CHG as you would any other liquid soap.  You can apply chg directly to the skin and wash.  Gently with a scrungie or clean washcloth.  5.  Apply the CHG Soap to your body ONLY FROM THE NECK DOWN.   Do   not use on face/ open                           Wound or open sores. Avoid contact with eyes, ears mouth and   genitals (private parts).                       Wash face,  Genitals (private parts) with your normal soap.             6.  Wash thoroughly, paying special attention to the area where your    surgery  will be performed.  7.  Thoroughly rinse your body with warm water from the neck down.  8.  DO NOT shower/wash with your normal soap after using and rinsing off the CHG Soap.                9.  Pat yourself dry with a clean towel.            10.  Wear clean pajamas.            11.  Place clean sheets on your bed the night of your first shower and do not  sleep with pets. Day of Surgery : Do not apply any lotions/deodorants the morning of surgery.  Please wear clean clothes to the  hospital/surgery center.  FAILURE TO FOLLOW THESE INSTRUCTIONS MAY RESULT IN THE CANCELLATION OF YOUR SURGERY  PATIENT SIGNATURE_________________________________  NURSE SIGNATURE__________________________________  ________________________________________________________________________

## 2019-07-20 NOTE — Progress Notes (Signed)
This patient returns to my office for at risk foot care.  This patient requires this care by a professional since this patient will be at risk due to having chronic kidney disease and type 2 diabetes. This patient is unable to cut nails himself since the patient cannot reach his nails.These nails are painful walking and wearing shoes.  This patient presents for at risk foot care today.  General Appearance  Alert, conversant and in no acute stress.  Vascular  Dorsalis pedis and posterior tibial  pulses are palpable  bilaterally.  Capillary return is within normal limits  bilaterally. Temperature is within normal limits  bilaterally.  Neurologic  Senn-Weinstein monofilament wire test within normal limits  bilaterally. Muscle power within normal limits bilaterally.  Nails Thick disfigured discolored nails with subungual debris  from hallux to fifth toes bilaterally. No evidence of bacterial infection or drainage bilaterally.  Orthopedic  No limitations of motion  feet .  No crepitus or effusions noted.  No bony pathology or digital deformities noted.  HAV  Left foot.  Skin  normotropic skin with no porokeratosis noted bilaterally.  No signs of infections or ulcers noted.   Thickened hyperkeratotic tissue  right foot plantarly.  Onychomycosis  Pain in right toes  Pain in left toes  Consent was obtained for treatment procedures.   Mechanical debridement of nails 1-5  bilaterally performed with a nail nipper.  Filed with dremel without incident. Told to use lac-hydrin.   Return office visit    3 months                  Told patient to return for periodic foot care and evaluation due to potential at risk complications.   Gardiner Barefoot DPM

## 2019-07-20 NOTE — Progress Notes (Addendum)
COVID Vaccine Completed: Yes Date COVID Vaccine completed: 05/11/2019 COVID vaccine manufacturer: Pfizer     PCP - Dr. Mickie Kay last office visit 06/22/2019 in epic Cardiologist - Dr. Margaretha Sheffield last office visit 07/06/2019 in epic  Chest x-ray - 09/24/2018 in epic EKG - 07/06/2019 in epic Stress Test -04/13/2019 in epic  ECHO - 10/01/2017 in epic Cardiac Cath - N/A  Sleep Study - 01/29/2018 in epic biPAP - Yes  Fasting Blood Sugar - 120's-190's Checks Blood Sugar ___3__ times a day Hgb A1c 8.4 on 06/21/2019  Blood Thinner Instructions:N/A Aspirin Instructions:N/A Last Dose:N/A  Anesthesia review: oxygen 2l continous, OSA, COPD, Chronic Hypoxic Resp. Failure, CKDIII, CHF, DM, Morbid Obesity, PSVT, HTN  Patient denies shortness of breath, fever, cough and chest pain at PAT appointment   Patient verbalized understanding of instructions that were given to them at the PAT appointment. Patient was also instructed that they will need to review over the PAT instructions again at home before surgery.

## 2019-07-20 NOTE — Telephone Encounter (Signed)
Received fax that says missing/illegible information on Rx.  The pharmacy note says Date: today is 07/15/19 cant postdate prescriptions please correct date to 07/15/2019 and resend if appropriate. Thanks.  I will place this in your box for reference.  Marcellius Montagna Zimmerman Rumple, CMA

## 2019-07-21 ENCOUNTER — Encounter (HOSPITAL_COMMUNITY)
Admission: RE | Admit: 2019-07-21 | Discharge: 2019-07-21 | Disposition: A | Discharge status: Home / Self Care | Payer: Medicare Other | Source: Ambulatory Visit | Attending: Urology | Admitting: Urology

## 2019-07-21 ENCOUNTER — Encounter (HOSPITAL_COMMUNITY): Payer: Self-pay

## 2019-07-21 ENCOUNTER — Other Ambulatory Visit: Payer: Self-pay

## 2019-07-21 ENCOUNTER — Other Ambulatory Visit (HOSPITAL_COMMUNITY): Payer: Medicare Other

## 2019-07-21 ENCOUNTER — Encounter: Payer: Self-pay | Admitting: Family Medicine

## 2019-07-21 DIAGNOSIS — Z01812 Encounter for preprocedural laboratory examination: Secondary | ICD-10-CM | POA: Insufficient documentation

## 2019-07-21 HISTORY — DX: Carpal tunnel syndrome, bilateral upper limbs: G56.03

## 2019-07-21 LAB — COMPREHENSIVE METABOLIC PANEL
ALT: 34 U/L (ref 0–44)
AST: 28 U/L (ref 15–41)
Albumin: 4 g/dL (ref 3.5–5.0)
Alkaline Phosphatase: 89 U/L (ref 38–126)
Anion gap: 13 (ref 5–15)
BUN: 32 mg/dL — ABNORMAL HIGH (ref 6–20)
CO2: 29 mmol/L (ref 22–32)
Calcium: 9.2 mg/dL (ref 8.9–10.3)
Chloride: 99 mmol/L (ref 98–111)
Creatinine, Ser: 1.48 mg/dL — ABNORMAL HIGH (ref 0.44–1.00)
GFR calc Af Amer: 46 mL/min — ABNORMAL LOW (ref >60)
GFR calc non Af Amer: 39 mL/min — ABNORMAL LOW (ref >60)
Glucose, Bld: 151 mg/dL — ABNORMAL HIGH (ref 70–99)
Potassium: 3.2 mmol/L — ABNORMAL LOW (ref 3.5–5.1)
Sodium: 141 mmol/L (ref 135–145)
Total Bilirubin: 0.6 mg/dL (ref 0.3–1.2)
Total Protein: 7.4 g/dL (ref 6.5–8.1)

## 2019-07-21 LAB — CBC
HCT: 49 % — ABNORMAL HIGH (ref 36.0–46.0)
Hemoglobin: 16.6 g/dL — ABNORMAL HIGH (ref 12.0–15.0)
MCH: 27.4 pg (ref 26.0–34.0)
MCHC: 33.9 g/dL (ref 30.0–36.0)
MCV: 81 fL (ref 80.0–100.0)
Platelets: 308 10*3/uL (ref 150–400)
RBC: 6.05 MIL/uL — ABNORMAL HIGH (ref 3.87–5.11)
RDW: 15.1 % (ref 11.5–15.5)
WBC: 11.4 10*3/uL — ABNORMAL HIGH (ref 4.0–10.5)
nRBC: 0 % (ref 0.0–0.2)

## 2019-07-21 LAB — GLUCOSE, CAPILLARY: Glucose-Capillary: 160 mg/dL — ABNORMAL HIGH (ref 70–99)

## 2019-07-21 MED ORDER — EMPAGLIFLOZIN 25 MG PO TABS: 25.0000 mg | ORAL_TABLET | Freq: Every day | ORAL | 3 refills | Status: DC

## 2019-07-21 NOTE — Telephone Encounter (Signed)
Refill for Jardiance sent to pharmacy.

## 2019-07-23 ENCOUNTER — Other Ambulatory Visit (HOSPITAL_COMMUNITY)
Admission: RE | Admit: 2019-07-23 | Discharge: 2019-07-23 | Disposition: A | Discharge status: Home / Self Care | Payer: Medicare Other | Source: Ambulatory Visit | Attending: Urology | Admitting: Urology

## 2019-07-23 DIAGNOSIS — Z01812 Encounter for preprocedural laboratory examination: Secondary | ICD-10-CM | POA: Insufficient documentation

## 2019-07-23 DIAGNOSIS — Z20822 Contact with and (suspected) exposure to covid-19: Secondary | ICD-10-CM | POA: Insufficient documentation

## 2019-07-23 LAB — SARS CORONAVIRUS 2 (TAT 6-24 HRS): SARS Coronavirus 2: NEGATIVE

## 2019-07-26 DIAGNOSIS — M5030 Other cervical disc degeneration, unspecified cervical region: Secondary | ICD-10-CM | POA: Diagnosis not present

## 2019-07-26 DIAGNOSIS — G894 Chronic pain syndrome: Secondary | ICD-10-CM | POA: Diagnosis not present

## 2019-07-26 DIAGNOSIS — Z79899 Other long term (current) drug therapy: Secondary | ICD-10-CM | POA: Diagnosis not present

## 2019-07-26 DIAGNOSIS — M5136 Other intervertebral disc degeneration, lumbar region: Secondary | ICD-10-CM | POA: Diagnosis not present

## 2019-07-26 MED ORDER — DEXTROSE 5 % IV SOLN
3.0000 g | INTRAVENOUS | Status: AC
  Administered 2019-07-27: 2 g via INTRAVENOUS
  Filled 2019-07-26: qty 3

## 2019-07-26 NOTE — Anesthesia Preprocedure Evaluation (Addendum)
Anesthesia Evaluation  Patient identified by MRN, date of birth, ID band Patient awake    Reviewed: Allergy & Precautions, NPO status , Patient's Chart, lab work & pertinent test results  Airway Mallampati: III  TM Distance: >3 FB Neck ROM: Full    Dental no notable dental hx. (+) Upper Dentures, Dental Advisory Given,    Pulmonary shortness of breath, asthma , sleep apnea and Continuous Positive Airway Pressure Ventilation , COPD, Current Smoker and Patient abstained from smoking.,    Pulmonary exam normal breath sounds clear to auscultation       Cardiovascular hypertension, Pt. on medications and Pt. on home beta blockers +CHF  Normal cardiovascular exam Rhythm:Regular Rate:Normal     Neuro/Psych  Headaches, PSYCHIATRIC DISORDERS Anxiety Depression Bipolar Disorder  Neuromuscular disease    GI/Hepatic GERD  Medicated and Controlled,(+)     substance abuse  , Hepatitis -, B  Endo/Other  diabetes, Type 2, Insulin DependentMorbid obesity  Renal/GU Renal disease     Musculoskeletal  (+) Arthritis ,   Abdominal (+) + obese,   Peds  Hematology   Anesthesia Other Findings   Reproductive/Obstetrics                           Anesthesia Physical Anesthesia Plan  ASA: III  Anesthesia Plan: General   Post-op Pain Management:    Induction: Intravenous  PONV Risk Score and Plan: 4 or greater and Treatment may vary due to age or medical condition, Ondansetron, Dexamethasone and Midazolam  Airway Management Planned: LMA  Additional Equipment: None  Intra-op Plan:   Post-operative Plan:   Informed Consent: I have reviewed the patients History and Physical, chart, labs and discussed the procedure including the risks, benefits and alternatives for the proposed anesthesia with the patient or authorized representative who has indicated his/her understanding and acceptance.     Dental advisory  given  Plan Discussed with: CRNA  Anesthesia Plan Comments:       Anesthesia Quick Evaluation

## 2019-07-27 ENCOUNTER — Ambulatory Visit (HOSPITAL_COMMUNITY): Payer: Medicare Other | Admitting: Anesthesiology

## 2019-07-27 ENCOUNTER — Encounter (HOSPITAL_COMMUNITY): Payer: Self-pay | Admitting: Urology

## 2019-07-27 ENCOUNTER — Encounter (HOSPITAL_COMMUNITY)
Admission: RE | Disposition: A | Discharge status: Home / Self Care | Payer: Self-pay | Source: Home / Self Care | Attending: Urology

## 2019-07-27 ENCOUNTER — Ambulatory Visit: Payer: Medicare Other | Admitting: Physical Therapy

## 2019-07-27 ENCOUNTER — Ambulatory Visit (HOSPITAL_COMMUNITY): Payer: Medicare Other | Admitting: Physician Assistant

## 2019-07-27 ENCOUNTER — Ambulatory Visit (HOSPITAL_COMMUNITY)
Admission: RE | Admit: 2019-07-27 | Discharge: 2019-07-27 | Disposition: A | Discharge status: Home / Self Care | Payer: Medicare Other | Attending: Urology | Admitting: Urology

## 2019-07-27 DIAGNOSIS — I130 Hypertensive heart and chronic kidney disease with heart failure and stage 1 through stage 4 chronic kidney disease, or unspecified chronic kidney disease: Secondary | ICD-10-CM | POA: Insufficient documentation

## 2019-07-27 DIAGNOSIS — K219 Gastro-esophageal reflux disease without esophagitis: Secondary | ICD-10-CM | POA: Diagnosis not present

## 2019-07-27 DIAGNOSIS — I509 Heart failure, unspecified: Secondary | ICD-10-CM | POA: Insufficient documentation

## 2019-07-27 DIAGNOSIS — M8949 Other hypertrophic osteoarthropathy, multiple sites: Secondary | ICD-10-CM | POA: Diagnosis not present

## 2019-07-27 DIAGNOSIS — G4730 Sleep apnea, unspecified: Secondary | ICD-10-CM | POA: Insufficient documentation

## 2019-07-27 DIAGNOSIS — F319 Bipolar disorder, unspecified: Secondary | ICD-10-CM | POA: Insufficient documentation

## 2019-07-27 DIAGNOSIS — J449 Chronic obstructive pulmonary disease, unspecified: Secondary | ICD-10-CM | POA: Diagnosis not present

## 2019-07-27 DIAGNOSIS — N183 Chronic kidney disease, stage 3 unspecified: Secondary | ICD-10-CM | POA: Insufficient documentation

## 2019-07-27 DIAGNOSIS — I5032 Chronic diastolic (congestive) heart failure: Secondary | ICD-10-CM | POA: Diagnosis not present

## 2019-07-27 DIAGNOSIS — E1122 Type 2 diabetes mellitus with diabetic chronic kidney disease: Secondary | ICD-10-CM | POA: Insufficient documentation

## 2019-07-27 DIAGNOSIS — F17210 Nicotine dependence, cigarettes, uncomplicated: Secondary | ICD-10-CM | POA: Insufficient documentation

## 2019-07-27 DIAGNOSIS — E1140 Type 2 diabetes mellitus with diabetic neuropathy, unspecified: Secondary | ICD-10-CM | POA: Diagnosis not present

## 2019-07-27 DIAGNOSIS — Z6841 Body Mass Index (BMI) 40.0 and over, adult: Secondary | ICD-10-CM | POA: Diagnosis not present

## 2019-07-27 DIAGNOSIS — N3941 Urge incontinence: Secondary | ICD-10-CM | POA: Insufficient documentation

## 2019-07-27 DIAGNOSIS — N3944 Nocturnal enuresis: Secondary | ICD-10-CM | POA: Insufficient documentation

## 2019-07-27 DIAGNOSIS — Z794 Long term (current) use of insulin: Secondary | ICD-10-CM | POA: Insufficient documentation

## 2019-07-27 HISTORY — PX: BOTOX INJECTION: SHX5754

## 2019-07-27 LAB — GLUCOSE, CAPILLARY
Glucose-Capillary: 193 mg/dL — ABNORMAL HIGH (ref 70–99)
Glucose-Capillary: 171 mg/dL — ABNORMAL HIGH (ref 70–99)

## 2019-07-27 SURGERY — BOTOX INJECTION
Anesthesia: General

## 2019-07-27 MED ORDER — LIDOCAINE HCL (CARDIAC) PF 100 MG/5ML IV SOSY
PREFILLED_SYRINGE | INTRAVENOUS | Status: DC | PRN
  Administered 2019-07-27: 80 mg via INTRAVENOUS

## 2019-07-27 MED ORDER — SODIUM CHLORIDE 0.9 % IR SOLN
Status: DC | PRN
  Administered 2019-07-27: 3000 mL

## 2019-07-27 MED ORDER — FENTANYL CITRATE (PF) 100 MCG/2ML IJ SOLN
INTRAMUSCULAR | Status: AC
  Filled 2019-07-27: qty 2

## 2019-07-27 MED ORDER — MIDAZOLAM HCL 5 MG/5ML IJ SOLN
INTRAMUSCULAR | Status: DC | PRN
  Administered 2019-07-27 (×2): 1 mg via INTRAVENOUS

## 2019-07-27 MED ORDER — ORAL CARE MOUTH RINSE: 15.0000 mL | Freq: Once | OROMUCOSAL | Status: AC

## 2019-07-27 MED ORDER — ONABOTULINUMTOXINA 100 UNITS IJ SOLR
INTRAMUSCULAR | Status: DC | PRN
  Administered 2019-07-27: 200 [IU] via INTRAMUSCULAR

## 2019-07-27 MED ORDER — SODIUM CHLORIDE (PF) 0.9 % IJ SOLN
INTRAMUSCULAR | Status: DC | PRN
  Administered 2019-07-27: 20 mL

## 2019-07-27 MED ORDER — DEXAMETHASONE SODIUM PHOSPHATE 10 MG/ML IJ SOLN
INTRAMUSCULAR | Status: AC
  Filled 2019-07-27: qty 1

## 2019-07-27 MED ORDER — LACTATED RINGERS IV SOLN: INTRAVENOUS | Status: DC

## 2019-07-27 MED ORDER — DEXAMETHASONE SODIUM PHOSPHATE 4 MG/ML IJ SOLN
INTRAMUSCULAR | Status: DC | PRN
  Administered 2019-07-27: 4 mg via INTRAVENOUS

## 2019-07-27 MED ORDER — FENTANYL CITRATE (PF) 100 MCG/2ML IJ SOLN
INTRAMUSCULAR | Status: DC | PRN
  Administered 2019-07-27 (×2): 50 ug via INTRAVENOUS

## 2019-07-27 MED ORDER — PROPOFOL 10 MG/ML IV BOLUS
INTRAVENOUS | Status: DC | PRN
  Administered 2019-07-27: 30 mg via INTRAVENOUS
  Administered 2019-07-27: 200 mg via INTRAVENOUS

## 2019-07-27 MED ORDER — ONABOTULINUMTOXINA 100 UNITS IJ SOLR
INTRAMUSCULAR | Status: AC
  Filled 2019-07-27: qty 100

## 2019-07-27 MED ORDER — ONDANSETRON HCL 4 MG/2ML IJ SOLN
INTRAMUSCULAR | Status: DC | PRN
  Administered 2019-07-27: 4 mg via INTRAVENOUS

## 2019-07-27 MED ORDER — ONDANSETRON HCL 4 MG/2ML IJ SOLN: INTRAMUSCULAR | Status: DC | PRN

## 2019-07-27 MED ORDER — SODIUM CHLORIDE (PF) 0.9 % IJ SOLN
INTRAMUSCULAR | Status: AC
  Filled 2019-07-27: qty 20

## 2019-07-27 MED ORDER — MIDAZOLAM HCL 2 MG/2ML IJ SOLN
INTRAMUSCULAR | Status: AC
  Filled 2019-07-27: qty 2

## 2019-07-27 MED ORDER — PROPOFOL 10 MG/ML IV BOLUS
INTRAVENOUS | Status: AC
  Filled 2019-07-27: qty 20

## 2019-07-27 MED ORDER — ONDANSETRON HCL 4 MG/2ML IJ SOLN
INTRAMUSCULAR | Status: AC
  Filled 2019-07-27: qty 2

## 2019-07-27 MED ORDER — CHLORHEXIDINE GLUCONATE 0.12 % MT SOLN
15.0000 mL | Freq: Once | OROMUCOSAL | Status: AC
  Administered 2019-07-27: 15 mL via OROMUCOSAL

## 2019-07-27 SURGICAL SUPPLY — 18 items
BAG URO CATCHER STRL LF (MISCELLANEOUS) ×2 IMPLANT
CATH ROBINSON RED A/P 14FR (CATHETERS) IMPLANT
CLOTH BEACON ORANGE TIMEOUT ST (SAFETY) ×2 IMPLANT
ELECT REM PT RETURN 15FT ADLT (MISCELLANEOUS) ×2 IMPLANT
GLOVE BIO SURGEON STRL SZ7.5 (GLOVE) ×2 IMPLANT
GOWN STRL REUS W/ TWL XL LVL3 (GOWN DISPOSABLE) ×3 IMPLANT
GOWN STRL REUS W/TWL XL LVL3 (GOWN DISPOSABLE) ×6
KIT TURNOVER CYSTO (KITS) ×2 IMPLANT
MANIFOLD NEPTUNE II (INSTRUMENTS) ×2 IMPLANT
NDL ASPIRATION 22 (NEEDLE) IMPLANT
NDL SAFETY ECLIPSE 18X1.5 (NEEDLE) IMPLANT
NEEDLE ASPIRATION 22 (NEEDLE) ×2 IMPLANT
NEEDLE HYPO 18GX1.5 SHARP (NEEDLE)
PACK CYSTO (CUSTOM PROCEDURE TRAY) ×2 IMPLANT
SYR 20ML LL LF (SYRINGE) IMPLANT
SYR CONTROL 10ML LL (SYRINGE) IMPLANT
TUBING CONNECTING 10 (TUBING) ×2 IMPLANT
WATER STERILE IRR 3000ML UROMA (IV SOLUTION) ×2 IMPLANT

## 2019-07-27 NOTE — H&P (Signed)
Urology Preoperative H&P   Chief Complaint: Urge incontinence  History of Present Illness: [name] is a 54 y.o. female with a history of cauda equina syndrome, a neurogenic bladder, urge incontinence and nocturnal enuresis. S/p Botox intravesical injections on 12/29/19, which markedly improved her urinary symptoms.  However, over the past month, the patient reports progressively worsening urinary urgency, urge incontinence and nocturnal enuresis.  She denies interval UTIs, dysuria or hematuria.  She is here today for repeat Botox injections.   Past Medical History:  Diagnosis Date  . Alcoholism ([place])   . Anxiety   . Arthritis 04-10-11   hips, shoulders, back  . Asthma   . Bipolar disorder ([place])   . Cardiomegaly   . Carpal tunnel syndrome, bilateral   . Cervical cancer ([place]) 1993   cervical, no treatment done, went away per pt  . Cervical dysplasia or atypia 04-10-11   '93- once dx.-got pregnant-no intervention, then postpartum, no dysplasia found  . CHF (congestive heart failure) ([place])    no cardiologist 2014 dx, none now  . Chronic hypoxemic respiratory failure ([place])   . Chronic kidney disease    stage 3 kidney disease  . 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 Pathology showed Condyloma, gluteal CONDYLOMA ACUMINATUM.   [name] Kitchen COPD (chronic obstructive pulmonary disease) ([place])   . Depression   . Diabetes mellitus without complication ([place])   . Dyspnea    with actity, sitting  . Fall 12/16/2016  . Fibrocystic breast disease   . GERD (gastroesophageal reflux disease)   . Grade I diastolic dysfunction 67/61/9509   Noted on ECHO  . Hepatitis    hep B-count is low at present,doesn't register  . History of PSVT (paroxysmal supraventricular tachycardia)   . Hypercalcemia   . Hyperlipidemia   . Hypertension   . Incomplete left bundle branch block (LBBB) 09/24/2018   Noted on EKG  . Lung nodule 11/2017   right and left lung  . Migraine headache     none recent  . Morbid obesity ([place]) 03/27/2006  . Neuropathy   . Pneumonia    walking pneu 15 yrs. ago  . Skin lesion 03/15/2011   In gluteal crease now s/p removal by Dr. [name] of General Surgery on 3/12. Path shows condyloma.     . Sleep apnea 04-10-11   uses cpap, pt does not know settings  . Trigger finger    left third  . Urge incontinence of urine     Past Surgical History:  Procedure Laterality Date  . ANAL FISTULECTOMY  04/17/2011   Procedure: FISTULECTOMY ANAL;  Surgeon: Imogene Burn. [name], MD;  Location: [facility];  Service: General;  Laterality: N/A;  Excision of Condyloma Gluteal Cleft   . ANAL RECTAL MANOMETRY N/A 03/26/2019   Procedure: ANO RECTAL MANOMETRY;  Surgeon: Thornton Park, MD;  [facility]: [facility];  Service: Gastroenterology;  Laterality: N/A;  . BIOPSY  03/09/2019   Procedure: BIOPSY;  Surgeon: Thornton Park, MD;  [facility];  Service: Gastroenterology;;  . Lum Keas INJECTION N/A 12/29/2018   Procedure: BOTOX INJECTION(100 UNITS)  WITH CYSTOSCOPY;  Surgeon: Ceasar Mons, MD;  Location: [facility];  Service: Urology;  Laterality: N/A;  . BREAST CYST EXCISION     bilateral breast, 3 cysts removed from each breast  . BREAST EXCISIONAL BIOPSY Right    x 3  . BREAST EXCISIONAL BIOPSY Left    x 3  . CARPAL TUNNEL RELEASE Bilateral   .  Cervical biospy    . CERVICAL FUSION  04-10-11   x3- cervical fusion with plating and screws-Dr. [name]  . COLONOSCOPY WITH PROPOFOL N/A 07/06/2015   Procedure: COLONOSCOPY WITH PROPOFOL;  Surgeon: [name], MD;  [facility];  Service: Endoscopy;  Laterality: N/A;  . COLONOSCOPY WITH PROPOFOL N/A 03/09/2019   Procedure: COLONOSCOPY WITH PROPOFOL;  Surgeon: Thornton Park, MD;  [facility];  Service: Gastroenterology;  Laterality: N/A;  . DOPPLER ECHOCARDIOGRAPHY  04/06/2012   AT [facility] 55-60%  . FOOT SURGERY Left 2018  . FRACTURE SURGERY     little toe left foot  . I & D  EXTREMITY Left 12/18/2016   Procedure: IRRIGATION AND DEBRIDEMENT LEFT FOOT, CLOSURE;  Surgeon: [name], MD;  Location: [facility];  Service: Orthopedics;  Laterality: Left;  . [place] SURGERY  04-10-11   x5-Lumbar fusion-retained hardware.(Dr. Louanne Skye)  . MULTIPLE TOOTH EXTRACTIONS    . POLYPECTOMY  03/09/2019   Procedure: POLYPECTOMY;  Surgeon: Thornton Park, MD;  [facility];  Service: Gastroenterology;;  . SHOULDER ARTHROSCOPY WITH ROTATOR CUFF REPAIR AND SUBACROMIAL DECOMPRESSION Left 05/07/2019   Procedure: LEFT SHOULDER ARTHROSCOPY WITH DEBRIDEMENT, DISTAL CLAVICLE EXCISION,  SUBACROMIAL DECOMPRESSION AND POSSIBLE ROTATOR CUFF REPAIR;  Surgeon: [name], MD;  Location: [facility];  Service: Orthopedics;  Laterality: Left;  . TEE WITHOUT CARDIOVERSION N/A 04/06/2012   Procedure: TRANSESOPHAGEAL ECHOCARDIOGRAM (TEE);  Surgeon: [name], MD;  Location: [facility];  Service: Cardiovascular;  Laterality: N/A;  . TRIGGER FINGER RELEASE Right    middle finger  . TRIGGER FINGER RELEASE Left 06/28/2016   Procedure: RELEASE TRIGGER FINGER LEFT 3RD FINGER;  Surgeon: [name], MD;  Location: [facility];  Service: Orthopedics;  Laterality: Left;  . TRIGGER FINGER RELEASE Right 06/12/2016   Procedure: RIGHT INDEX FINGER TRIGGER RELEASE;  Surgeon: [name], MD;  Location: [facility];  Service: Orthopedics;  Laterality: Right;  . TRIGGER FINGER RELEASE Right 01/19/2018   Procedure: RIGHT RING FINGER TRIGGER FINGER RELEASE;  Surgeon: [name], MD;  Location: [facility];  Service: Orthopedics;  Laterality: Right;  . TRIGGER FINGER RELEASE Left 05/07/2019   Procedure: RELEASE TRIGGER FINGER LEFT INDEX FINGER;  Surgeon: [name], MD;  Location: [place];  Service: Orthopedics;  Laterality: Left;    Allergies:  Allergies  Allergen Reactions  . Aspirin Other (See Comments)    Kidney disease  . 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 Corticosteroids Other  (See Comments)    Hyperglycemia   . Levofloxacin Itching    Family History  Problem Relation Age of Onset  . Pulmonary embolism Mother   . Stroke Father   . Alcohol abuse Father   . Pneumonia Father   . Hypertension Father   . Breast cancer Maternal Aunt   . Hypertension Other   . Diabetes Other        Aunts and cousins  . Asthma Sister   . Depression Sister   . Arthritis Sister   . Sickle cell anemia Son     Social History:  reports that she has been smoking cigarettes. She started smoking about 42 years ago. She has a 8.00 pack-year smoking history. She has never used smokeless tobacco. She reports previous drug use. Drugs: Marijuana and "Crack" cocaine. She reports that she does not drink alcohol.  ROS: A complete review of systems was performed.  All systems are negative except  for pertinent findings as noted.  Physical Exam:  Vital signs in last 24 hours:   Constitutional:  Alert and oriented, No acute distress Cardiovascular: Regular rate and rhythm, No JVD Respiratory: Normal respiratory effort, Lungs clear bilaterally GI: Abdomen is soft, nontender, nondistended, no abdominal masses GU: No CVA tenderness Lymphatic: No lymphadenopathy Neurologic: Grossly intact, no focal deficits Psychiatric: Normal mood and affect  Laboratory Data:  No results for input(s): WBC, HGB, HCT, PLT in the last 72 hours.  No results for input(s): NA, K, CL, GLUCOSE, BUN, CALCIUM, CREATININE in the last 72 hours.  Invalid input(s): CO3   No results found. However, due to the size of the patient record, not all encounters were searched. Please check Results Review for a complete set of results. Recent Results (from the past 240 hour(s))  SARS CORONAVIRUS 2 (TAT 6-24 HRS) Nasopharyngeal Nasopharyngeal Swab     Status: None   Collection Time: 07/23/19  8:51 AM   Specimen: Nasopharyngeal Swab  Result Value Ref Range Status   SARS Coronavirus 2 NEGATIVE NEGATIVE Final    Comment:  (NOTE) SARS-CoV-2 target nucleic acids are NOT DETECTED.  The SARS-CoV-2 RNA is generally detectable in upper and lower respiratory specimens during the acute phase of infection. Negative results do not preclude SARS-CoV-2 infection, do not rule out co-infections with other pathogens, and should not be used as the sole basis for treatment or other patient management decisions. Negative results must be combined with clinical observations, patient history, and epidemiological information. The expected result is Negative.  Fact Sheet for Patients: SugarRoll.be  Fact Sheet for Healthcare Providers: https://www.woods-mathews.com/  This test is not yet approved or cleared by the [place] FDA and  has been authorized for detection and/or diagnosis of SARS-CoV-2 by FDA under an Emergency Use Authorization (EUA). This EUA will remain  in effect (meaning this test can be used) for the duration of the COVID-19 declaration under Se ction 564(b)(1) of the Act, 21 U.S.C. section 360bbb-3(b)(1), unless the authorization is terminated or revoked sooner.  Performed at [facility], [place] [address], [place], [place] [address]     Renal Function: Recent Labs    07/21/19 1109  CREATININE 1.48*   Estimated Creatinine Clearance: 71.8 mL/min (A) (by C-G formula based on SCr of 1.48 mg/dL (H)).  Radiologic Imaging: No results found.  I independently reviewed the above imaging studies.  Assessment and Plan SMT. LODER is a 54 y.o. female with a history of cauda equina syndrome, a neurogenic bladder, urge incontinence and nocturnal enuresis.   -The risk, benefits and alternatives of cystoscopy with intravesical Botox injections was discussed with the patient.  Risks include, but are not limited to bleeding, urinary tract infection, MI, CVA, DVT and the inherent risk of general anesthesia.  She voices understanding and wishes to  proceed.  Ellison Hughs, MD 07/27/2019, 10:26 AM  [facility] Pager: [phone]

## 2019-07-27 NOTE — Anesthesia Procedure Notes (Signed)
Procedure Name: LMA Insertion Date/Time: 07/27/2019 12:00 PM Performed by: Lavina Hamman, CRNA Pre-anesthesia Checklist: Patient identified, Emergency Drugs available, Suction available and Patient being monitored Patient Re-evaluated:Patient Re-evaluated prior to induction Oxygen Delivery Method: Circle System Utilized Preoxygenation: Pre-oxygenation with 100% oxygen Induction Type: IV induction Ventilation: Mask ventilation without difficulty LMA: LMA with gastric port inserted LMA Size: 5.0 Number of attempts: 1 Airway Equipment and Method: Bite block Placement Confirmation: positive ETCO2 Tube secured with: Tape Dental Injury: Teeth and Oropharynx as per pre-operative assessment

## 2019-07-27 NOTE — Transfer of Care (Signed)
Immediate Anesthesia Transfer of Care Note  Patient: [name]  Procedure(s) Performed: Procedure(s) with comments: BOTOX INJECTION WITH CYSTOSCOPY (N/A) - ONLY NEEDS 30 MIN  Patient Location: PACU  Anesthesia Type:General  Level of Consciousness:  sedated, patient cooperative and responds to stimulation  Airway & Oxygen Therapy:Patient Spontanous Breathing and Patient connected to face mask oxgen  Post-op Assessment:  Report given to PACU RN and Post -op Vital signs reviewed and stable  Post vital signs:  Reviewed and stable  Last Vitals:  Vitals:   07/27/19 1029  BP: (!) 158/78  Resp: 18  Temp: 37.3 C  SpO2: 09%    Complications: No apparent anesthesia complications

## 2019-07-27 NOTE — Anesthesia Postprocedure Evaluation (Signed)
Anesthesia Post Note  Patient: [name]  Procedure(s) Performed: BOTOX INJECTION WITH CYSTOSCOPY (N/A )     Patient location during evaluation: PACU Anesthesia Type: General Level of consciousness: awake and alert Pain management: pain level controlled Vital Signs Assessment: post-procedure vital signs reviewed and stable Respiratory status: spontaneous breathing, nonlabored ventilation, respiratory function stable and patient connected to nasal cannula oxygen Cardiovascular status: blood pressure returned to baseline and stable Postop Assessment: no apparent nausea or vomiting Anesthetic complications: no   No complications documented.  Last Vitals:  Vitals:   07/27/19 1300 07/27/19 1315  BP: 123/69 111/87  Pulse: 77 78  Resp: 20 (!) 25  Temp: 36.8 C   SpO2: 93% 93%    Last Pain:  Vitals:   07/27/19 1300  TempSrc:   PainSc: 0-No pain                 Barnet Glasgow

## 2019-07-28 ENCOUNTER — Ambulatory Visit: Payer: Medicare Other | Admitting: Licensed Clinical Social Worker

## 2019-07-28 ENCOUNTER — Encounter (HOSPITAL_COMMUNITY): Payer: Self-pay | Admitting: Urology

## 2019-07-28 ENCOUNTER — Other Ambulatory Visit: Payer: Self-pay

## 2019-07-28 ENCOUNTER — Ambulatory Visit: Payer: Medicare Other | Admitting: Gastroenterology

## 2019-07-28 DIAGNOSIS — Z7189 Other specified counseling: Secondary | ICD-10-CM

## 2019-07-28 NOTE — Chronic Care Management (AMB) (Signed)
Care Management   Clinical Social Work Follow Up   0/86/5784 Name: [name] MRN: [id number] DOB: [birth year] Referred by: [name], MD  Reason for referral : Care Coordination (advance directive education)  [name] is a 54 y.o. year old female who is a primary care patient of [name], MD.  Reason for follow-up: assess for barriers and progress with advance directive .   Assessment: Patient has reviewed advance directive pack.  Plan:  1. Will bring advance directive pack in to office visit to have notarized  2.   No F/U needed by LCSW at this time Advance Directive Status: N See Care Plan and [place] application for related entries. ; not addressed during this encounter.  SDOH (Social Determinants of Health) assessments performed: ; No needs identified   Goals Addressed            This Visit's Progress   . Advance Directives   On track    [place] (see longitudinal plan of care for additional care plan information)  Current Barriers & Progress:  . Patient does not have an Forensic scientist . Patient acknowledges deficits, education and support in order to complete this document . Patient received advance directive packet in the mail and reviewed information Clinical Social Work Goal(s): Over the next 45 days, the patient will  . review EMMI education on Advance Directive as evidenced by patient self report of review Interventions provided by LCSW: . Assessed understanding of, reviewed and answered questions on Advance Directives Patient Self Care Activities:  . Able to identify [place] . patient will complete Advance Directive packet, have notarized and provide a copy to provider office . No further assistance needed from LCSW at this time Please see past updates related to this goal by clicking on the "Past Updates" button in the selected goal       Outpatient Encounter Medications as of 07/28/2019    Medication Sig Note  . Accu-Chek Softclix Lancets lancets TEST 4 TIMES DAILY   . acetaminophen (TYLENOL) 500 MG tablet Take 1,000 mg by mouth 2 (two) times daily.    [name] Kitchen albuterol (VENTOLIN HFA) 108 (90 Base) MCG/ACT inhaler Inhale 2 puffs into the lungs every 6 (six) hours as needed for wheezing or shortness of breath.   [name] Kitchen ammonium lactate (LAC-HYDRIN) 12 % lotion APPLY TWICE A DAY AS NEEDED FOR DRY SKIN (Patient taking differently: Apply 1 application topically in the morning and at bedtime. )   . atorvastatin (LIPITOR) 40 MG tablet TAKE 1 TABLET BY MOUTH EVERY DAY (Patient taking differently: Take 40 mg by mouth daily. )   . b complex vitamins capsule Take 1 capsule by mouth daily.   . baclofen (LIORESAL) 10 MG tablet Take 1 tablet (10 mg total) by mouth 3 (three) times daily as needed for muscle spasms. (Patient taking differently: Take 10 mg by mouth 3 (three) times daily. )   . blood glucose meter kit and supplies KIT Dispense based on patient and insurance preference. Use up to four times daily as directed. (FOR ICD-9 250.00, 250.01).   . busPIRone (BUSPAR) 10 MG tablet TAKE 1 TABLET BY MOUTH THREE TIMES A DAY (Patient taking differently: Take 10 mg by mouth 3 (three) times daily. )   . diclofenac Sodium (VOLTAREN) 1 % GEL Apply 2 g topically 4 (four) times daily.   [name] Kitchen diltiazem (CARDIZEM CD) 120 MG 24 hr capsule Take 1 capsule (120 mg total) by  mouth 2 (two) times daily.   [name] Kitchen docusate sodium (COLACE) 100 MG capsule Take 200 mg by mouth 2 (two) times daily.   . empagliflozin (JARDIANCE) 25 MG TABS tablet Take 1 tablet (25 mg total) by mouth daily.   . febuxostat (ULORIC) 40 MG tablet Take 40 mg by mouth at bedtime.    . fluticasone (FLONASE) 50 MCG/ACT nasal spray Place 2 sprays into both nostrils 2 (two) times daily.   . Fluticasone-Umeclidin-Vilant (TRELEGY ELLIPTA) 100-62.5-25 MCG/INH AEPB Inhale 1 puff into the lungs daily.   . furosemide (LASIX) 40 MG tablet TAKE 2 TABLETS (80 MG TOTAL) BY  MOUTH 2 (TWO) TIMES DAILY.   [name] Kitchen gabapentin (NEURONTIN) 300 MG capsule TAKE 2 CAPSULES (600 MG TOTAL) BY MOUTH 3 (THREE) TIMES DAILY.   [name] Kitchen glucose blood (ACCU-CHEK AVIVA PLUS) test strip 1 each by Other route 3 (three) times daily. TEST 3 TIMES DAILY   . HYDROcodone-acetaminophen (NORCO) 7.5-325 MG tablet Take 1 tablet by mouth every 6 (six) hours as needed for moderate pain. (Patient taking differently: Take 1 tablet by mouth in the morning, at noon, in the evening, and at bedtime. )   . hydrOXYzine (ATARAX/VISTARIL) 10 MG tablet TAKE 1 TABLET BY MOUTH EVERY DAY (Patient taking differently: Take 10 mg by mouth daily. )   . insulin glargine (LANTUS SOLOSTAR) 100 UNIT/ML Solostar Pen Inject 20 Units into the skin daily before breakfast. 07/27/2019: Half dose before surgery on 6/29  . insulin lispro (HUMALOG KWIKPEN) 100 UNIT/ML KwikPen Inject 0.08 mLs (8 Units total) into the skin 3 (three) times daily before meals.   . Insulin Pen Needle (B-D UF III MINI PEN NEEDLES) 31G X 5 MM MISC CHECK SUGARS 4 TIMES A DAY BEFORE MEALS AND AT BEDTIME.   [name] Kitchen ketoconazole (NIZORAL) 2 % cream APPLY 1 FINGERTIP AMOUNT TO EACH FOOT DAILY. (Patient taking differently: Apply 1 application topically daily. )   . loratadine (CLARITIN) 10 MG tablet TAKE 1 TABLET BY MOUTH EVERY DAY (Patient taking differently: Take 10 mg by mouth daily. )   . losartan (COZAAR) 100 MG tablet Take 1 tablet (100 mg total) by mouth daily.   . metolazone (ZAROXOLYN) 2.5 MG tablet Take 1 tablet (2.5 mg total) by mouth 2 (two) times a week. TAKE 1 TABLET TWICE WEEKLY ON MONDAYS AND THURSDAYS ONLY (Patient taking differently: Take 2.5 mg by mouth See admin instructions. TAKE 2.5 BY MOUTH  TWICE WEEKLY ON MONDAYS AND THURSDAYS ONLY)   . montelukast (SINGULAIR) 10 MG tablet TAKE 1 TABLET BY MOUTH EVERYDAY AT BEDTIME (Patient taking differently: Take 10 mg by mouth at bedtime. )   . Multiple Vitamins-Minerals (MULTIVITAMIN WITH MINERALS) tablet Take 1 tablet by  mouth daily.   . mupirocin ointment (BACTROBAN) 2 % APPLY TO AFFECTED AREA TWICE A DAY (Patient taking differently: Apply 1 application topically 2 (two) times daily as needed (skin fold (stomach)). )   . NARCAN 4 MG/0.1ML LIQD nasal spray kit Place 1 spray into the nose as needed (accidental overdose).  07/27/2019: Never used  . nicotine polacrilex (NICORETTE) 4 MG gum CHEW 1 EACH (4 MG TOTAL) BY MOUTH AS NEEDED FOR SMOKING CESSATION. (Patient taking differently: Take 4 mg by mouth in the morning and at bedtime. )   . NON FORMULARY Uses a C-PAP at bedtime   . nystatin (MYCOSTATIN) 100000 UNIT/ML suspension Take 5 mLs (500,000 Units total) by mouth 4 (four) times daily. Swish and swallow. (Patient taking differently: Take 5 mLs by mouth 4 (  four) times daily as needed (dry mouth). Swish and swallow.)   . Omega-3 Fatty Acids (FISH OIL) 1000 MG CAPS Take 1,000 mg by mouth daily.    [name] Kitchen omeprazole (PRILOSEC) 20 MG capsule Take 1 capsule (20 mg total) by mouth daily.   . OXYGEN Inhale 2 L into the lungs continuous.    . potassium chloride SA (KLOR-CON M20) 20 MEQ tablet Take 1 tablet (20 mEq total) by mouth 2 (two) times a week. Take 1 tablet twice weekly on Monday and Thursday. (Patient taking differently: Take 20 mEq by mouth See admin instructions. Take 20 mEq by mouth twice weekly on Monday and Thursday.)   . sertraline (ZOLOFT) 100 MG tablet Take 1 tablet (100 mg total) by mouth daily.   . varenicline (CHANTIX CONTINUING MONTH PAK) 1 MG tablet Take 1 tablet (1 mg total) by mouth 2 (two) times daily.   . [DISCONTINUED] atorvastatin (LIPITOR) 40 MG tablet Take 1 tablet (40 mg total) by mouth daily.   . [DISCONTINUED] CHANTIX 0.5 MG tablet TAKE 1 TABLET (0.5 MG TOTAL) BY MOUTH 2 (TWO) TIMES DAILY.   . [DISCONTINUED] diclofenac Sodium (VOLTAREN) 1 % GEL Apply 2 g topically 4 (four) times daily.   . [DISCONTINUED] loratadine (CLARITIN) 10 MG tablet TAKE 1 TABLET BY MOUTH EVERY DAY (Patient taking differently:  Take 10 mg by mouth daily. )   . [DISCONTINUED] nicotine polacrilex ([employer]S NICOTINE POLACRILEX) 4 MG gum Take 1 each (4 mg total) by mouth as needed for smoking cessation.    Facility-Administered Encounter Medications as of 07/28/2019  Medication  . technetium tetrofosmin (TC-MYOVIEW) injection 62.3 millicurie   Review of patient status, including review of consultants reports, relevant laboratory and other test results, and collaboration with appropriate care team members and the patient's provider was performed as part of comprehensive patient evaluation and provision of care management services.    [name], [facility]   [phone] 1:49 PM

## 2019-07-29 ENCOUNTER — Ambulatory Visit: Payer: Medicare Other | Attending: Orthopaedic Surgery | Admitting: Physical Therapy

## 2019-07-29 ENCOUNTER — Other Ambulatory Visit: Payer: Self-pay

## 2019-07-29 ENCOUNTER — Encounter: Payer: Self-pay | Admitting: Physical Therapy

## 2019-07-29 ENCOUNTER — Ambulatory Visit: Payer: Medicare Other | Admitting: Family Medicine

## 2019-07-29 DIAGNOSIS — R278 Other lack of coordination: Secondary | ICD-10-CM | POA: Insufficient documentation

## 2019-07-29 DIAGNOSIS — Z9889 Other specified postprocedural states: Secondary | ICD-10-CM | POA: Diagnosis not present

## 2019-07-29 DIAGNOSIS — K6289 Other specified diseases of anus and rectum: Secondary | ICD-10-CM | POA: Insufficient documentation

## 2019-07-29 DIAGNOSIS — M6281 Muscle weakness (generalized): Secondary | ICD-10-CM | POA: Insufficient documentation

## 2019-07-29 DIAGNOSIS — M25512 Pain in left shoulder: Secondary | ICD-10-CM | POA: Insufficient documentation

## 2019-07-29 DIAGNOSIS — R29898 Other symptoms and signs involving the musculoskeletal system: Secondary | ICD-10-CM

## 2019-07-29 NOTE — Op Note (Signed)
Operative Note  Preoperative diagnosis:  1.  Urge incontinence   Postoperative diagnosis: 1.  Urge incontinence   Procedure(s): 1.  Cystoscopy with bladder botox injections (200 units)  Surgeon: Ellison Hughs, MD  Assistants:  None  Anesthesia:  General  Complications:  None  EBL:  <5 mL  Specimens: 1. None  Drains/Catheters: 1.  None  Intraoperative findings:   1. No intravesical lesions  Indication:  [name] is a 54 y.o. female with refractory urge incontinence that is well managed with intravesical botox injections.  She has been consented for the above procedures, voices understanding and wishes to proceed.   Description of procedure:  After informed consent was obtained, the patient was brought to the operating room and general anesthesia was administered.  She was then positioned in the dorsal lithotomy position and prepped and draped in the usual fashion.  A timeout was then performed.  A 23 French rigid cystoscopy was then introduced into the bladder and a complete bladder survey revealed no intravesical lesions.  A total of 200 units of botox was then injected in 0.5 mL aliquots throughout the bladder musculature.  There was no significnt bleeding at the following the injections.  Her bladder was then drained.  She tolerated the procedure well and was transferred to the post-anesthesia unit in stable condition   Plan:  Follow up in 2 weeks w/ PVR

## 2019-07-30 ENCOUNTER — Ambulatory Visit: Payer: Medicare Other | Admitting: Specialist

## 2019-07-30 ENCOUNTER — Other Ambulatory Visit: Payer: Self-pay | Admitting: Nephrology

## 2019-08-03 ENCOUNTER — Ambulatory Visit (INDEPENDENT_AMBULATORY_CARE_PROVIDER_SITE_OTHER): Payer: Medicare Other | Admitting: Orthopaedic Surgery

## 2019-08-03 ENCOUNTER — Encounter: Payer: Self-pay | Admitting: Orthopaedic Surgery

## 2019-08-03 DIAGNOSIS — M75102 Unspecified rotator cuff tear or rupture of left shoulder, not specified as traumatic: Secondary | ICD-10-CM

## 2019-08-03 DIAGNOSIS — R109 Unspecified abdominal pain: Secondary | ICD-10-CM | POA: Diagnosis not present

## 2019-08-03 NOTE — Addendum Note (Signed)
Addended by: Raeford Razor L on: 08/03/2019 01:01 PM   Modules accepted: Orders

## 2019-08-03 NOTE — Therapy (Addendum)
[place], [place], [address] Phone: [phone]   Fax:  [phone]  Physical Therapy Treatment/Discharge Patient Details  Name: [name] MRN: [id number] Date of Birth: 1965/10/07 Referring Provider (PT): Dr. Erlinda Hong    Encounter Date: 07/29/2019   PT End of Session - 08/03/19 1251    Visit Number 5    Number of Visits 16    Date for PT Re-Evaluation 09/13/19    Authorization Type UHC MCR and MCD    PT Start Time 1100    PT Stop Time 1144    PT Time Calculation (min) 44 min    Activity Tolerance Patient tolerated treatment well    Behavior During Therapy [name] for tasks assessed/performed           Past Medical History:  Diagnosis Date  . Alcoholism ([place])   . Anxiety   . Arthritis 04-10-11   hips, shoulders, back  . Asthma   . Bipolar disorder ([place])   . Cardiomegaly   . Carpal tunnel syndrome, bilateral   . Cervical cancer ([place]) 1993   cervical, no treatment done, went away per pt  . Cervical dysplasia or atypia 04-10-11   '93- once dx.-got pregnant-no intervention, then postpartum, no dysplasia found  . CHF (congestive heart failure) ([place])    no cardiologist 2014 dx, none now  . Chronic hypoxemic respiratory failure ([place])   . Chronic kidney disease    stage 3 kidney disease  . 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 Pathology showed Condyloma, gluteal CONDYLOMA ACUMINATUM.   [name] Kitchen COPD (chronic obstructive pulmonary disease) ([place])   . Depression   . Diabetes mellitus without complication ([place])   . Dyspnea    with actity, sitting  . Fall 12/16/2016  . Fibrocystic breast disease   . GERD (gastroesophageal reflux disease)   . Grade I diastolic dysfunction 10/93/2355   Noted on ECHO  . Hepatitis    hep B-count is low at present,doesn't register  . History of PSVT (paroxysmal supraventricular tachycardia)   . Hypercalcemia   . Hyperlipidemia   . Hypertension   .  Incomplete left bundle branch block (LBBB) 09/24/2018   Noted on EKG  . Lung nodule 11/2017   right and left lung  . Migraine headache    none recent  . Morbid obesity ([place]) 03/27/2006  . Neuropathy   . Pneumonia    walking pneu 15 yrs. ago  . Skin lesion 03/15/2011   In gluteal crease now s/p removal by Dr. [name] of General Surgery on 3/12. Path shows condyloma.     . Sleep apnea 04-10-11   uses cpap, pt does not know settings  . Trigger finger    left third  . Urge incontinence of urine     Past Surgical History:  Procedure Laterality Date  . ANAL FISTULECTOMY  04/17/2011   Procedure: FISTULECTOMY ANAL;  Surgeon: Imogene Burn. [name], MD;  Location: [facility];  Service: General;  Laterality: N/A;  Excision of Condyloma Gluteal Cleft   . ANAL RECTAL MANOMETRY N/A 03/26/2019   Procedure: ANO RECTAL MANOMETRY;  Surgeon: Thornton Park, MD;  Location: [facility];  Service: Gastroenterology;  Laterality: N/A;  . BIOPSY  03/09/2019   Procedure: BIOPSY;  Surgeon: Thornton Park, MD;  [facility];  Service: Gastroenterology;;  . [place] INJECTION N/A 12/29/2018   Procedure: BOTOX INJECTION(100 UNITS)  WITH CYSTOSCOPY;  Surgeon: Ceasar Mons, MD;  Location: [facility];  Service: Urology;  Laterality: N/A;  . BOTOX INJECTION N/A 07/27/2019   Procedure: BOTOX INJECTION WITH CYSTOSCOPY;  Surgeon: Ceasar Mons, MD;  Location: [facility];  Service: Urology;  Laterality: N/A;  ONLY NEEDS 30 MIN  . BREAST CYST EXCISION     bilateral breast, 3 cysts removed from each breast  . BREAST EXCISIONAL BIOPSY Right    x 3  . BREAST EXCISIONAL BIOPSY Left    x 3  . CARPAL TUNNEL RELEASE Bilateral   . Cervical biospy    . CERVICAL FUSION  04-10-11   x3- cervical fusion with plating and screws-Dr. [name]  . COLONOSCOPY WITH PROPOFOL N/A 07/06/2015   Procedure: COLONOSCOPY WITH PROPOFOL;  Surgeon: [name], MD;  [facility];  Service: Endoscopy;  Laterality: N/A;  .  COLONOSCOPY WITH PROPOFOL N/A 03/09/2019   Procedure: COLONOSCOPY WITH PROPOFOL;  Surgeon: Thornton Park, MD;  [facility];  Service: Gastroenterology;  Laterality: N/A;  . DOPPLER ECHOCARDIOGRAPHY  04/06/2012   AT [facility] 55-60%  . FOOT SURGERY Left 2018  . FRACTURE SURGERY     little toe left foot  . I & D EXTREMITY Left 12/18/2016   Procedure: IRRIGATION AND DEBRIDEMENT LEFT FOOT, CLOSURE;  Surgeon: [name], MD;  Location: [facility];  Service: Orthopedics;  Laterality: Left;  . [place] SURGERY  04-10-11   x5-Lumbar fusion-retained hardware.(Dr. Louanne Skye)  . MULTIPLE TOOTH EXTRACTIONS    . POLYPECTOMY  03/09/2019   Procedure: POLYPECTOMY;  Surgeon: Thornton Park, MD;  [facility];  Service: Gastroenterology;;  . SHOULDER ARTHROSCOPY WITH ROTATOR CUFF REPAIR AND SUBACROMIAL DECOMPRESSION Left 05/07/2019   Procedure: LEFT SHOULDER ARTHROSCOPY WITH DEBRIDEMENT, DISTAL CLAVICLE EXCISION,  SUBACROMIAL DECOMPRESSION AND POSSIBLE ROTATOR CUFF REPAIR;  Surgeon: [name], MD;  Location: [facility];  Service: Orthopedics;  Laterality: Left;  . TEE WITHOUT CARDIOVERSION N/A 04/06/2012   Procedure: TRANSESOPHAGEAL ECHOCARDIOGRAM (TEE);  Surgeon: [name], MD;  [facility];  Service: Cardiovascular;  Laterality: N/A;  . TRIGGER FINGER RELEASE Right    middle finger  . TRIGGER FINGER RELEASE Left 06/28/2016   Procedure: RELEASE TRIGGER FINGER LEFT 3RD FINGER;  Surgeon: [name], MD;  Location: [facility];  Service: Orthopedics;  Laterality: Left;  . TRIGGER FINGER RELEASE Right 06/12/2016   Procedure: RIGHT INDEX FINGER TRIGGER RELEASE;  Surgeon: [name], MD;  Location: [facility];  Service: Orthopedics;  Laterality: Right;  . TRIGGER FINGER RELEASE Right 01/19/2018   Procedure: RIGHT RING FINGER TRIGGER FINGER RELEASE;  Surgeon: [name], MD;  Location: [facility];  Service: Orthopedics;  Laterality: Right;  . TRIGGER FINGER RELEASE Left 05/07/2019    Procedure: RELEASE TRIGGER FINGER LEFT INDEX FINGER;  Surgeon: [name], MD;  Location: [facility];  Service: Orthopedics;  Laterality: Left;    There were no vitals filed for this visit.       [name] PT Assessment - 08/03/19 0001      AROM   Left Shoulder Flexion 140 Degrees   135 abduction , touches T2 , reaches lumbar     Strength   Left Shoulder Flexion 4-/5    Left Shoulder ABduction 3+/5    Left Shoulder Internal Rotation 4+/5    Left Shoulder External Rotation 4/5      Ambulation/Gait   Ambulation/Gait Yes    Ambulation Distance (Feet) 220 Feet   SP02 dropped to 82% -she does not bring supplemental O2    Assistive device  Rolling walker    Gait Pattern Step-through pattern    Ambulation Surface Level;Indoor    Gait Comments Increased shoulder pain after 110 ft in clinic during trip to restroom  , cues for posture and avoid elevating shoulders                [facility] Adult PT Treatment/Exercise - 08/03/19 0001      Shoulder Exercises: Seated   External Rotation Limitations red band x 20     Internal Rotation Limitations red band x 20     Flexion Limitations over head press 3# x 10     Other Seated Exercises seated forward raise, lateral raise and scaption 2# to fatigue each way , 5 reps, 10  reps , then switched to 1# and able to complete 5 more reps each                     PT Short Term Goals - 07/29/19 1106      PT SHORT TERM GOAL #1   Title Pt will be I with HEP for strength and ROM within limits of surgical protocol    Time 4    Period Weeks    Status Achieved    Target Date 06/21/19      PT SHORT TERM GOAL #2   Title Pt will be able to improve transfers in the clinic with mod I (stand-pivot and bed mobility)    Baseline independent    Time 4    Period Weeks    Status Achieved      PT SHORT TERM GOAL #3   Title Pt will be able to use L UE to assist with ADLs below shoulder height with min increase in pain    Time 4    Period Weeks     Status Achieved    Target Date 06/21/19      PT SHORT TERM GOAL #4   Title deferred    Baseline --    Time --    Period --    Status --             PT Long Term Goals - 07/29/19 1113      PT LONG TERM GOAL #1   Title Pt will be I with more advanced HEP for L shoulder ROM and strength    Baseline progressing    Time 8    Period Weeks    Status On-going      PT LONG TERM GOAL #2   Title Pt willl be able to increase L UE strength in shoulder flexion and abduction to 4/5 for mobility tasks.    Baseline 3+/5 abduction    Time 8    Period Weeks    Status Partially Met      PT LONG TERM GOAL #3   Title Pt will be able to walk with walker and min shoulder pain short distances (300 feet)    Baseline does not need walker for household distances, used clinic RW in clinic for ambulation with shoulder pain after 13f . Does not lift RW out of car yet due to heaviness    Time 8    Period Weeks    Status On-going      PT LONG TERM GOAL #4   Title Pt will demonstrate full AROM in sitting in functional reach (ER and IR) with min discomfort    Baseline min discomfort reaching into ER behind head to T2 otherise no pain  with function reaching ROM.    Time 8    Period Weeks    Status Achieved                 Plan - 08/03/19 1256    Clinical Impression Statement Left shoulder AROM is WFL all planes. LTG# 4 met.  She is limited by weakness. She is using 1# weight at home. Attmpted 2# today with pt quickly fatiguing in 3-5 reps. Asked her to continue with1# weight at home and increased reps and frequency. She will benefit from continued PT for shoulder strengthening to maximize UE function.    Examination-Participation Restrictions Church;Meal Prep;Cleaning;Community Activity;Driving;Interpersonal Relationship;Laundry;Shop    Stability/Clinical Decision Making Stable/Uncomplicated    PT Frequency 2x / week    PT Treatment/Interventions ADLs/Self Care Home Management;Patient/family  education;Functional mobility training;Therapeutic activities;Moist Heat;Therapeutic exercise;Cryotherapy;Electrical Stimulation;Manual techniques;Neuromuscular re-education;Taping;Passive range of motion    PT Next Visit Plan shoulder strengthening , AROM, sit-stand    PT Home Exercise Plan table slides, pendulums and scap squeeze, added isometrics and supine cane pressups and pullovers, added 1# forward raise, lateral raise and overhead press, gave green band for ER    Consulted and Agree with Plan of Care Patient           Patient will benefit from skilled therapeutic intervention in order to improve the following deficits and impairments:  Abnormal gait, Decreased range of motion, Difficulty walking, Obesity, Impaired UE functional use, Decreased endurance, Cardiopulmonary status limiting activity, Decreased activity tolerance, Pain, Impaired flexibility, Decreased balance, Decreased strength, Decreased mobility, Postural dysfunction  Visit Diagnosis: Muscle weakness (generalized) - Plan: PT plan of care cert/re-cert  Acute pain of left shoulder - Plan: PT plan of care cert/re-cert  S/P left rotator cuff repair - Plan: PT plan of care cert/re-cert  Other symptoms and signs involving the musculoskeletal system - Plan: PT plan of care cert/re-cert     Problem List Patient Active Problem List   Diagnosis Date Noted  . Body mass index 50.0-59.9, adult ([place]) 06/12/2019  . Constipation due to outlet dysfunction   . Impingement syndrome of left shoulder 05/07/2019  . Polyp of colon   . Polyp of ascending colon   . Rectal discomfort 02/12/2019  . Osteoarthritis of right hip 02/12/2019  . Nontraumatic tear of left supraspinatus tendon 12/30/2018  . Gout 12/17/2018  . Nontraumatic complete tear of right rotator cuff 12/08/2018  . Rotator cuff tear, right 12/01/2018  . Chest pain 09/24/2018  . Right groin pain 07/20/2018  . Depressed mood 07/13/2018  . Mouth pain 06/02/2018  .  Paroxysmal SVT (supraventricular tachycardia) ([place]) 03/20/2018  . Pure hypercholesterolemia 03/20/2018  . Muscle cramping 02/25/2018  . Nodule of upper lobe of right lung 02/13/2018  . Falls, subsequent encounter 01/30/2018  . Pain in left foot 11/04/2017  . Open wound of left foot 11/04/2017  . Essential hypertension 10/27/2017  . Solitary pulmonary nodule 10/07/2017  . Restrictive lung disease secondary to obesity 10/01/2017  . Polycythemia, secondary 10/01/2017  . Chronic viral hepatitis B without delta-agent ([place]) 07/08/2017  . COPD (chronic obstructive pulmonary disease) with chronic bronchitis ([place]) 06/27/2017  . Chronic kidney disease (CKD), stage IV (severe) ([place]) 01/14/2017  . Type 2 diabetes mellitus with stage 3 chronic kidney disease, with long-term current use of insulin ([place]) 12/12/2016  . Urge incontinence of urine 12/05/2016  . Trigger finger, left index finger 07/15/2016  . Back pain 04/07/2014  . Morbid obesity ([place]) 10/02/2012  . Chronic diastolic CHF (congestive heart  failure) ([place]) 04/07/2012  . GERD 01/26/2010  . Obstructive sleep apnea treated with BiPAP 02/03/2008  . FIBROCYSTIC BREAST DISEASE 10/28/2006  . Hyperlipidemia 03/27/2006  . Obesity hypoventilation syndrome ([place]) 03/27/2006  . Former tobacco use 03/27/2006  . Depression with anxiety 03/27/2006    [name] 08/03/2019, 12:58 PM  [facility] [address] [place], [place], [address] Phone: [phone]   Fax:  [phone]  Name: [name] MRN: [id number] Date of Birth: Oct 31, 1965   Raeford Razor, PT 08/03/19 12:58 PM Phone: [phone] Fax: [phone]  PHYSICAL THERAPY DISCHARGE SUMMARY  Visits from Start of Care: 5  Current functional level related to goals / functional outcomes: See above    Remaining deficits: ROM, strength, endurance    Education / Equipment: HEP, posture, RICE  Plan: Patient agrees to discharge.   Patient goals were partially met. Patient is being discharged due to the patient's request.  ?????    Patient doing pelvic PT elsewhere.    Raeford Razor, PT 09/29/19 9:45 AM Phone: [phone] Fax: [phone]

## 2019-08-03 NOTE — Progress Notes (Signed)
Patient ID: [name], female   DOB: 06/29/1965, 54 y.o.   MRN: [id number]  Kristy is 55-month status post left shoulder rotator cuff repair.  She is doing well and progressing with physical therapy.  Her strength is improving.  She stopped smoking on June 22 of this year.  Surgical scars are fully healed.  Active range of motion is near normal.  Strength is still slightly decreased to manual muscle testing.  At this point she should continue with physical therapy to gain more strength to help with ADLs.  Recheck in 3 months.

## 2019-08-04 ENCOUNTER — Other Ambulatory Visit: Payer: Self-pay

## 2019-08-04 ENCOUNTER — Other Ambulatory Visit: Payer: Self-pay | Admitting: Nephrology

## 2019-08-04 ENCOUNTER — Encounter: Payer: Self-pay | Admitting: Family Medicine

## 2019-08-04 DIAGNOSIS — M5090 Cervical disc disorder, unspecified, unspecified cervical region: Secondary | ICD-10-CM | POA: Diagnosis not present

## 2019-08-04 DIAGNOSIS — N183 Chronic kidney disease, stage 3 unspecified: Secondary | ICD-10-CM | POA: Diagnosis not present

## 2019-08-04 DIAGNOSIS — E119 Type 2 diabetes mellitus without complications: Secondary | ICD-10-CM | POA: Diagnosis not present

## 2019-08-04 DIAGNOSIS — G4733 Obstructive sleep apnea (adult) (pediatric): Secondary | ICD-10-CM | POA: Diagnosis not present

## 2019-08-04 DIAGNOSIS — R5383 Other fatigue: Secondary | ICD-10-CM | POA: Diagnosis not present

## 2019-08-04 DIAGNOSIS — I509 Heart failure, unspecified: Secondary | ICD-10-CM | POA: Diagnosis not present

## 2019-08-04 DIAGNOSIS — J9621 Acute and chronic respiratory failure with hypoxia: Secondary | ICD-10-CM | POA: Diagnosis not present

## 2019-08-04 DIAGNOSIS — R109 Unspecified abdominal pain: Secondary | ICD-10-CM

## 2019-08-04 MED ORDER — BUSPIRONE HCL 10 MG PO TABS: 10.0000 mg | ORAL_TABLET | Freq: Three times a day (TID) | ORAL | 4 refills | Status: DC

## 2019-08-05 ENCOUNTER — Other Ambulatory Visit: Payer: Medicare Other

## 2019-08-05 ENCOUNTER — Ambulatory Visit: Payer: Medicare Other | Admitting: Physical Therapy

## 2019-08-05 ENCOUNTER — Other Ambulatory Visit: Payer: Self-pay

## 2019-08-05 DIAGNOSIS — M25512 Pain in left shoulder: Secondary | ICD-10-CM | POA: Diagnosis not present

## 2019-08-05 DIAGNOSIS — M6281 Muscle weakness (generalized): Secondary | ICD-10-CM | POA: Diagnosis not present

## 2019-08-05 DIAGNOSIS — Z9889 Other specified postprocedural states: Secondary | ICD-10-CM

## 2019-08-05 DIAGNOSIS — R278 Other lack of coordination: Secondary | ICD-10-CM | POA: Diagnosis not present

## 2019-08-05 DIAGNOSIS — R29898 Other symptoms and signs involving the musculoskeletal system: Secondary | ICD-10-CM | POA: Diagnosis not present

## 2019-08-05 DIAGNOSIS — K6289 Other specified diseases of anus and rectum: Secondary | ICD-10-CM | POA: Diagnosis not present

## 2019-08-05 NOTE — Progress Notes (Signed)
    SUBJECTIVE:   CHIEF COMPLAINT / HPI:   Numbness in hands and feet Patient reports numbness in her left pinky and ring finger on the palmar surface.  She reports she has had issues with this in the past.  Of note I have seen her for this in the past and patient reported she sits with her elbow on her table and it puts pressure on the elbow.  She stopped resting that way and the numbness improved. Patient is also complaining of "pins-and-needles" in her toes and in the ball of her feet bilaterally.  Patient reports that initially started in the fifth toe bilaterally and is now spread across all of the toes and into the ball of the foot.  She denies any numbness but says feels like it tingles sometimes.  Tobacco use Patient reports that she quit smoking on June 27 of this year.  She has not wanted a cigarette since that time and reports she is done for good.  Praised the patient for her good work and encouraged continued cessation.  PERTINENT  PMH / PSH: Diabetes, diabetic neuropathy  OBJECTIVE:   BP 125/80   Pulse 90   Ht 5\' 9"  (1.753 m)   Wt (!) 360 lb 3.2 oz (163.4 kg)   LMP 04/10/2006   SpO2 95%   BMI 53.19 kg/m   General: Well-appearing, no acute distress Respiratory: Normal work of breathing, lungs clear to auscultation bilaterally Cardiac: Regular rate and rhythm, no murmurs appreciated Neuro: Patient reports decreased sensation along palmar surface of fifth digit and medial side of fourth digit of left hand no weakness noted.  Numbness is noted in the median nerve distribution.  Tingling noticed in patient's toes but no decreased sensation.  Patient is able to ambulate well.  ASSESSMENT/PLAN:   Median nerve compression at elbow, left Patient reports numbness in median nerve distribution from her elbow to her fingertips in her left upper extremity.  Patient has had this issue in the past because she rests her elbow on her table next to her chair.  Instructed patient to  straighten arm out and decreased resting her arm on her elbow.  No weakness noted on exam.  Patient is satisfied and will change her habits. -Continue to monitor for improvement -No need for follow-up unless it worsens  Diabetic neuropathy associated with type 2 diabetes mellitus ([place]) Patient reports tingling in her toes and the balls of her feet bilaterally.  For years she has had tingling in her pinky toes but it has progressed now to all of her toes as well as the balls of her feet.  Denies any weakness or numbness but says it just feels like "pins-and-needles".  Patient is currently on Neurontin 600 mg 3 times a day. -This is most likely related to worsening diabetic neuropathy although her diabetes is under better control at this time -Increased Neurontin to 600 mg in the morning and at lunch and 900 mg at night -Monitor for worsening signs or symptoms.   [name], MD [place] [facility]

## 2019-08-05 NOTE — Therapy (Signed)
[place], [place], [address] Phone: [phone]   Fax:  [phone]  Physical Therapy Treatment  Patient Details  Name: [name] MRN: [id number] Date of Birth: 10/18/1965 Referring Provider (PT): Dr. Erlinda Hong    Encounter Date: 08/05/2019   PT End of Session - 08/05/19 1224    Visit Number 6    Number of Visits 16    Date for PT Re-Evaluation 09/13/19    Authorization Type UHC MCR and MCD    PT Start Time 1230    PT Stop Time 1315    PT Time Calculation (min) 45 min    Activity Tolerance Patient tolerated treatment well    Behavior During Therapy [facility] for tasks assessed/performed           Past Medical History:  Diagnosis Date  . Alcoholism ([place])   . Anxiety   . Arthritis 04-10-11   hips, shoulders, back  . Asthma   . Bipolar disorder ([place])   . Cardiomegaly   . Carpal tunnel syndrome, bilateral   . Cervical cancer ([place]) 1993   cervical, no treatment done, went away per pt  . Cervical dysplasia or atypia 04-10-11   '93- once dx.-got pregnant-no intervention, then postpartum, no dysplasia found  . CHF (congestive heart failure) ([place])    no cardiologist 2014 dx, none now  . Chronic hypoxemic respiratory failure ([place])   . Chronic kidney disease    stage 3 kidney disease  . 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 Pathology showed Condyloma, gluteal CONDYLOMA ACUMINATUM.   [name] Kitchen COPD (chronic obstructive pulmonary disease) ([place])   . Depression   . Diabetes mellitus without complication ([place])   . Dyspnea    with actity, sitting  . Fall 12/16/2016  . Fibrocystic breast disease   . GERD (gastroesophageal reflux disease)   . Grade I diastolic dysfunction 76/19/5093   Noted on ECHO  . Hepatitis    hep B-count is low at present,doesn't register  . History of PSVT (paroxysmal supraventricular tachycardia)   . Hypercalcemia   . Hyperlipidemia   . Hypertension   .  Incomplete left bundle branch block (LBBB) 09/24/2018   Noted on EKG  . Lung nodule 11/2017   right and left lung  . Migraine headache    none recent  . Morbid obesity ([place]) 03/27/2006  . Neuropathy   . Pneumonia    walking pneu 15 yrs. ago  . Skin lesion 03/15/2011   In gluteal crease now s/p removal by Dr. [name] of General Surgery on 3/12. Path shows condyloma.     . Sleep apnea 04-10-11   uses cpap, pt does not know settings  . Trigger finger    left third  . Urge incontinence of urine     Past Surgical History:  Procedure Laterality Date  . ANAL FISTULECTOMY  04/17/2011   Procedure: FISTULECTOMY ANAL;  Surgeon: Imogene Burn. [name], MD;  Location: [facility];  Service: General;  Laterality: N/A;  Excision of Condyloma Gluteal Cleft   . ANAL RECTAL MANOMETRY N/A 03/26/2019   Procedure: ANO RECTAL MANOMETRY;  Surgeon: Thornton Park, MD;  Location: [facility];  Service: Gastroenterology;  Laterality: N/A;  . BIOPSY  03/09/2019   Procedure: BIOPSY;  Surgeon: Thornton Park, MD;  [facility];  Service: Gastroenterology;;  . Lum [place] INJECTION N/A 12/29/2018   Procedure: BOTOX INJECTION(100 UNITS)  WITH CYSTOSCOPY;  Surgeon: Ceasar Mons, MD;  Location: [facility];  Service: Urology;  Laterality: N/A;  . BOTOX INJECTION N/A 07/27/2019   Procedure: BOTOX INJECTION WITH CYSTOSCOPY;  Surgeon: Ceasar Mons, MD;  Location: [facility];  Service: Urology;  Laterality: N/A;  ONLY NEEDS 30 MIN  . BREAST CYST EXCISION     bilateral breast, 3 cysts removed from each breast  . BREAST EXCISIONAL BIOPSY Right    x 3  . BREAST EXCISIONAL BIOPSY Left    x 3  . CARPAL TUNNEL RELEASE Bilateral   . Cervical biospy    . CERVICAL FUSION  04-10-11   x3- cervical fusion with plating and screws-Dr. [name]  . COLONOSCOPY WITH PROPOFOL N/A 07/06/2015   Procedure: COLONOSCOPY WITH PROPOFOL;  Surgeon: [name], MD;  [facility];  Service: Endoscopy;  Laterality: N/A;  .  COLONOSCOPY WITH PROPOFOL N/A 03/09/2019   Procedure: COLONOSCOPY WITH PROPOFOL;  Surgeon: Thornton Park, MD;  [facility];  Service: Gastroenterology;  Laterality: N/A;  . DOPPLER ECHOCARDIOGRAPHY  04/06/2012   AT [place] 55-60%  . FOOT SURGERY Left 2018  . FRACTURE SURGERY     little toe left foot  . I & D EXTREMITY Left 12/18/2016   Procedure: IRRIGATION AND DEBRIDEMENT LEFT FOOT, CLOSURE;  Surgeon: [name], MD;  Location: [place];  Service: Orthopedics;  Laterality: Left;  . [place] SURGERY  04-10-11   x5-Lumbar fusion-retained hardware.(Dr. Louanne Skye)  . MULTIPLE TOOTH EXTRACTIONS    . POLYPECTOMY  03/09/2019   Procedure: POLYPECTOMY;  Surgeon: Thornton Park, MD;  [facility];  Service: Gastroenterology;;  . SHOULDER ARTHROSCOPY WITH ROTATOR CUFF REPAIR AND SUBACROMIAL DECOMPRESSION Left 05/07/2019   Procedure: LEFT SHOULDER ARTHROSCOPY WITH DEBRIDEMENT, DISTAL CLAVICLE EXCISION,  SUBACROMIAL DECOMPRESSION AND POSSIBLE ROTATOR CUFF REPAIR;  Surgeon: [name], MD;  Location: [place];  Service: Orthopedics;  Laterality: Left;  . TEE WITHOUT CARDIOVERSION N/A 04/06/2012   Procedure: TRANSESOPHAGEAL ECHOCARDIOGRAM (TEE);  Surgeon: [name], MD;  [facility];  Service: Cardiovascular;  Laterality: N/A;  . TRIGGER FINGER RELEASE Right    middle finger  . TRIGGER FINGER RELEASE Left 06/28/2016   Procedure: RELEASE TRIGGER FINGER LEFT 3RD FINGER;  Surgeon: [name], MD;  Location: [place];  Service: Orthopedics;  Laterality: Left;  . TRIGGER FINGER RELEASE Right 06/12/2016   Procedure: RIGHT INDEX FINGER TRIGGER RELEASE;  Surgeon: [name], MD;  Location: [place];  Service: Orthopedics;  Laterality: Right;  . TRIGGER FINGER RELEASE Right 01/19/2018   Procedure: RIGHT RING FINGER TRIGGER FINGER RELEASE;  Surgeon: [name], MD;  Location: [place];  Service: Orthopedics;  Laterality: Right;  . TRIGGER FINGER RELEASE Left 05/07/2019    Procedure: RELEASE TRIGGER FINGER LEFT INDEX FINGER;  Surgeon: [name], MD;  Location: [place];  Service: Orthopedics;  Laterality: Left;    There were no vitals filed for this visit.   Subjective Assessment - 08/05/19 1235    Subjective I can reach and lift better. Still needs to get stronger.  I am now walking to my appts and not using my walker or my scooter.  I want to get the sleeve surgery and also I need to have my Rt hip replaced.    Currently in Pain? No/denies             [facility] Adult PT Treatment/Exercise - 08/05/19 0001      Shoulder Exercises: Seated   Horizontal ABduction Strengthening;Right;Left;10 reps;Theraband    Theraband Level (Shoulder Horizontal ABduction)  Level 2 (Red)    Horizontal ABduction Weight (lbs) 2 sets     External Rotation Strengthening;Left;20 reps    External Rotation Weight (lbs) 2   at abduction   External Rotation Limitations red band x 20     Flexion Limitations 2 lbs overhead press    then 2lbs x 10      Shoulder Exercises: Standing   Extension Strengthening;Both;20 reps;Theraband    Theraband Level (Shoulder Extension) Level 4 (Blue)    Row Strengthening;Both;20 reps    Theraband Level (Shoulder Row) Level 4 (Blue)      Shoulder Exercises: Pulleys   Flexion 3 minutes      Shoulder Exercises: ROM/Strengthening   Cybex Row Limitations standing row x 20 on countertop     Wall Pushups 10 reps    Wall Pushups Limitations 2 sets                     PT Short Term Goals - 07/29/19 1106      PT SHORT TERM GOAL #1   Title Pt will be I with HEP for strength and ROM within limits of surgical protocol    Time 4    Period Weeks    Status Achieved    Target Date 06/21/19      PT SHORT TERM GOAL #2   Title Pt will be able to improve transfers in the clinic with mod I (stand-pivot and bed mobility)    Baseline independent    Time 4    Period Weeks    Status Achieved      PT SHORT TERM GOAL #3   Title Pt will be able to use  L UE to assist with ADLs below shoulder height with min increase in pain    Time 4    Period Weeks    Status Achieved    Target Date 06/21/19      PT SHORT TERM GOAL #4   Title deferred    Baseline --    Time --    Period --    Status --             PT Long Term Goals - 07/29/19 1113      PT LONG TERM GOAL #1   Title Pt will be I with more advanced HEP for L shoulder ROM and strength    Baseline progressing    Time 8    Period Weeks    Status On-going      PT LONG TERM GOAL #2   Title Pt willl be able to increase L UE strength in shoulder flexion and abduction to 4/5 for mobility tasks.    Baseline 3+/5 abduction    Time 8    Period Weeks    Status Partially Met      PT LONG TERM GOAL #3   Title Pt will be able to walk with walker and min shoulder pain short distances (300 feet)    Baseline does not need walker for household distances, used clinic RW in clinic for ambulation with shoulder pain after 172f . Does not lift RW out of car yet due to heaviness    Time 8    Period Weeks    Status On-going      PT LONG TERM GOAL #4   Title Pt will demonstrate full AROM in sitting in functional reach (ER and IR) with min discomfort    Baseline min discomfort reaching into ER behind head to  T2 otherise no pain with function reaching ROM.    Time 8    Period Weeks    Status Achieved                 Plan - 08/05/19 1252    Clinical Impression Statement Patient has been using her 2 lbs weights.  She has alos decided not to use her asstive devices anymore.  I urged her to use her walker is her R hip felt weak. She has also quit smoking.  Progressing towards goals, strenghening.  Check ins on SaO2 reading down to 85% after standing and back up to 92%after 3 min.    PT Treatment/Interventions ADLs/Self Care Home Management;Patient/family education;Functional mobility training;Therapeutic activities;Moist Heat;Therapeutic exercise;Cryotherapy;Electrical Stimulation;Manual  techniques;Neuromuscular re-education;Taping;Passive range of motion    PT Next Visit Plan shoulder strengthening , AROM, sit-stand, monito o2    PT Home Exercise Plan table slides, pendulums and scap squeeze, added isometrics and supine cane pressups and pullovers, added 1# forward raise, lateral raise and overhead press, gave green band for ER    Consulted and Agree with Plan of Care Patient           Patient will benefit from skilled therapeutic intervention in order to improve the following deficits and impairments:  Abnormal gait, Decreased range of motion, Difficulty walking, Obesity, Impaired UE functional use, Decreased endurance, Cardiopulmonary status limiting activity, Decreased activity tolerance, Pain, Impaired flexibility, Decreased balance, Decreased strength, Decreased mobility, Postural dysfunction  Visit Diagnosis: Muscle weakness (generalized)  Acute pain of left shoulder  S/P left rotator cuff repair  Other symptoms and signs involving the musculoskeletal system     Problem List Patient Active Problem List   Diagnosis Date Noted  . Body mass index 50.0-59.9, adult ([place]) 06/12/2019  . Constipation due to outlet dysfunction   . Impingement syndrome of left shoulder 05/07/2019  . Polyp of colon   . Polyp of ascending colon   . Rectal discomfort 02/12/2019  . Osteoarthritis of right hip 02/12/2019  . Nontraumatic tear of left supraspinatus tendon 12/30/2018  . Gout 12/17/2018  . Nontraumatic complete tear of right rotator cuff 12/08/2018  . Rotator cuff tear, right 12/01/2018  . Chest pain 09/24/2018  . Right groin pain 07/20/2018  . Depressed mood 07/13/2018  . Mouth pain 06/02/2018  . Paroxysmal SVT (supraventricular tachycardia) ([place]) 03/20/2018  . Pure hypercholesterolemia 03/20/2018  . Muscle cramping 02/25/2018  . Nodule of upper lobe of right lung 02/13/2018  . Falls, subsequent encounter 01/30/2018  . Pain in left foot 11/04/2017  . Open wound of  left foot 11/04/2017  . Essential hypertension 10/27/2017  . Solitary pulmonary nodule 10/07/2017  . Restrictive lung disease secondary to obesity 10/01/2017  . Polycythemia, secondary 10/01/2017  . Chronic viral hepatitis B without delta-agent ([place]) 07/08/2017  . COPD (chronic obstructive pulmonary disease) with chronic bronchitis ([place]) 06/27/2017  . Chronic kidney disease (CKD), stage IV (severe) ([place]) 01/14/2017  . Type 2 diabetes mellitus with stage 3 chronic kidney disease, with long-term current use of insulin ([place]) 12/12/2016  . Urge incontinence of urine 12/05/2016  . Trigger finger, left index finger 07/15/2016  . Back pain 04/07/2014  . Morbid obesity ([place]) 10/02/2012  . Chronic diastolic CHF (congestive heart failure) ([place]) 04/07/2012  . GERD 01/26/2010  . Obstructive sleep apnea treated with BiPAP 02/03/2008  . FIBROCYSTIC BREAST DISEASE 10/28/2006  . Hyperlipidemia 03/27/2006  . Obesity hypoventilation syndrome ([place]) 03/27/2006  . Former tobacco use 03/27/2006  . Depression with  anxiety 03/27/2006    Sophiea Ueda 08/05/2019, 1:39 PM  [facility] [address] [place], [place], [address] Phone: [phone]   Fax:  [phone]  Name: [name] MRN: [id number] Date of Birth: 10-18-1965  Raeford Razor, PT 08/05/19 1:40 PM Phone: [phone] Fax: [phone]

## 2019-08-06 ENCOUNTER — Encounter: Payer: Self-pay | Admitting: Family Medicine

## 2019-08-06 ENCOUNTER — Other Ambulatory Visit: Payer: Self-pay

## 2019-08-06 ENCOUNTER — Ambulatory Visit (INDEPENDENT_AMBULATORY_CARE_PROVIDER_SITE_OTHER): Payer: Medicare Other | Admitting: Family Medicine

## 2019-08-06 DIAGNOSIS — G5602 Carpal tunnel syndrome, left upper limb: Secondary | ICD-10-CM | POA: Diagnosis not present

## 2019-08-06 DIAGNOSIS — E1140 Type 2 diabetes mellitus with diabetic neuropathy, unspecified: Secondary | ICD-10-CM | POA: Insufficient documentation

## 2019-08-06 DIAGNOSIS — E1142 Type 2 diabetes mellitus with diabetic polyneuropathy: Secondary | ICD-10-CM | POA: Diagnosis not present

## 2019-08-06 DIAGNOSIS — G5620 Lesion of ulnar nerve, unspecified upper limb: Secondary | ICD-10-CM | POA: Insufficient documentation

## 2019-08-06 MED ORDER — TETANUS-DIPHTH-ACELL PERTUSSIS 5-2.5-18.5 LF-MCG/0.5 IM SUSP: 0.5000 mL | Freq: Once | INTRAMUSCULAR | 0 refills | Status: AC

## 2019-08-06 MED ORDER — GABAPENTIN 300 MG PO CAPS: 600.0000 mg | ORAL_CAPSULE | Freq: Three times a day (TID) | ORAL | 2 refills | Status: DC

## 2019-08-06 NOTE — Patient Instructions (Addendum)
It was great to see you today!  You are doing wonderful with improving her health by stopping smoking as well as losing weight.  I want you to continue on this trend.  Regarding your neuropathy I am going to increase your Neurontin to taking 600 mg in the morning and at lunch and taking 900 mg at (3 tablets) at night.  Regarding your tetanus vaccine I have printed off this prescription and you can take it to your pharmacy.  It will be cheaper for you to get it at your pharmacy than it will in our office.  If you have any questions or concerns please call the clinic or message me and I will call you.  Have a wonderful afternoon!

## 2019-08-06 NOTE — Assessment & Plan Note (Signed)
Patient reports numbness in median nerve distribution from her elbow to her fingertips in her left upper extremity.  Patient has had this issue in the past because she rests her elbow on her table next to her chair.  Instructed patient to straighten arm out and decreased resting her arm on her elbow.  No weakness noted on exam.  Patient is satisfied and will change her habits. -Continue to monitor for improvement -No need for follow-up unless it worsens

## 2019-08-06 NOTE — Assessment & Plan Note (Signed)
Patient reports tingling in her toes and the balls of her feet bilaterally.  For years she has had tingling in her pinky toes but it has progressed now to all of her toes as well as the balls of her feet.  Denies any weakness or numbness but says it just feels like "pins-and-needles".  Patient is currently on Neurontin 600 mg 3 times a day. -This is most likely related to worsening diabetic neuropathy although her diabetes is under better control at this time -Increased Neurontin to 600 mg in the morning and at lunch and 900 mg at night -Monitor for worsening signs or symptoms.

## 2019-08-09 ENCOUNTER — Ambulatory Visit (INDEPENDENT_AMBULATORY_CARE_PROVIDER_SITE_OTHER): Payer: Medicare Other | Admitting: Psychology

## 2019-08-09 ENCOUNTER — Other Ambulatory Visit: Payer: Self-pay

## 2019-08-09 ENCOUNTER — Telehealth: Payer: Self-pay | Admitting: Family Medicine

## 2019-08-09 DIAGNOSIS — F320 Major depressive disorder, single episode, mild: Secondary | ICD-10-CM

## 2019-08-09 NOTE — BH Specialist Note (Signed)
Integrated Behavioral Health Visit   5/91/6384 CAYCI MCNABB [id number]   Session Start time:9  Session End time: 930 Total time: 30  Referring Provider: Dr. Caron Presume  PRESENTING CONCERNS: Patient and/or family reports the following symptoms/concerns: Pt reports she has had much improved mood since making huge life style changes.  Pt reports she has quit smoking and is engaging in weight loss. Pt shared some previous traumas that led her to compromising her health.  Pt reports she is on a good path to continue this life style change journey.  Pt has requested a signed letter from Dr. Hartford Poli regarding her gastric sleeve.  Appropriate forms were give to patient to gather further information.     Duration of problem: past year; Severity of problem: mild  STRENGTHS (Protective Factors/Coping Skills): Insight and previous experience of overcoming hard times   GOALS ADDRESSED: Patient will: 1.  Reduce symptoms of: depression: pt syx are improved since last appt;   2.  Increase knowledge and/or ability of: self-management skills: pt engaged and motivated to challenge negative thoughts and reframed; cont to reinforce   3.  Demonstrate ability to: Increase healthy adjustment to current life circumstances  INTERVENTIONS: Interventions utilized:  Brief CBT Standardized Assessments completed: Patient declined screening and Not Needed  ASSESSMENT: Patient currently experiencing depressed mood related to stressful life circumstances .   Patient may benefit from supportive therapy and CBT coping strategies  PLAN: 1. Follow up with behavioral health clinician on : challenging her thoughts  2. Behavioral recommendations: continue therapy  3. Referral(s): [place] (In Clinic)  Erlinda Hong, PhD., LMFT-A

## 2019-08-09 NOTE — Telephone Encounter (Signed)
Patient is dropping off paper work for Dr. Caron Presume to fill out. This is a community alternative program of care request worksheet. Last date of service was 08/06/19.  I have placed form in white team folder.

## 2019-08-10 ENCOUNTER — Ambulatory Visit: Payer: Medicare Other | Admitting: Licensed Clinical Social Worker

## 2019-08-10 DIAGNOSIS — Z7189 Other specified counseling: Secondary | ICD-10-CM

## 2019-08-10 NOTE — Chronic Care Management (AMB) (Signed)
Care Management   Clinical Social Work Follow Up   04/12/1759 Name: [name] MRN: [id number] DOB: 01/22/1966 Referred by: [name], MD  Reason for referral : Care Coordination (advance directive)  [name] is a 54 y.o. year old female who is a primary care patient of [name], MD.  Reason for follow-up: assess for barriers and progress with advance directives .   Assessment: Patient has miss placed her completed advance directives and needs a new form Plan:  1. Patient will get address and phone numbers  2.   LCSW will assist patient with completing form once she obtains information  Advance Directive Status: N See Care Plan and Vynca application for related entries.[name] Kitchen  SDOH (Social Determinants of Health) assessments performed:  No needs identified   Goals Addressed            This Visit's Progress   . Advance Directives   Not on track    [place] (see longitudinal plan of care for additional care plan information)  Current Barriers & Progress:  . Patient does not have an Forensic scientist . Patient acknowledges deficits, education and support in order to complete this document . Patient received advance directive packet in the mail and reviewed information . Patient misplaced advance directive forms and needs new forms Clinical Social Work Goal(s): Over the next 45 days, the patient will  . review EMMI education on Advance Directive as evidenced by patient self report of review Interventions provided by LCSW: . Assessed understanding of, reviewed and answered questions on Advance Directives . Will assist patient with new forms once she locates contact information for health care POA Patient Self Care Activities:  . Able to identify Progress . patient will obtain contact information and call LCSW . Patient will complete Advance Directive packet, have notarized and provide a copy to provider  office Please see past updates related to this goal by clicking on the "Past Updates" button in the selected goal       Outpatient Encounter Medications as of 08/10/2019  Medication Sig Note  . Accu-Chek Softclix Lancets lancets TEST 4 TIMES DAILY   . acetaminophen (TYLENOL) 500 MG tablet Take 1,000 mg by mouth 2 (two) times daily.    [name] Kitchen albuterol (VENTOLIN HFA) 108 (90 Base) MCG/ACT inhaler Inhale 2 puffs into the lungs every 6 (six) hours as needed for wheezing or shortness of breath.   [name] Kitchen ammonium lactate (LAC-HYDRIN) 12 % lotion APPLY TWICE A DAY AS NEEDED FOR DRY SKIN (Patient taking differently: Apply 1 application topically in the morning and at bedtime. )   . atorvastatin (LIPITOR) 40 MG tablet TAKE 1 TABLET BY MOUTH EVERY DAY (Patient taking differently: Take 40 mg by mouth daily. )   . b complex vitamins capsule Take 1 capsule by mouth daily.   . baclofen (LIORESAL) 10 MG tablet Take 1 tablet (10 mg total) by mouth 3 (three) times daily as needed for muscle spasms. (Patient taking differently: Take 10 mg by mouth 3 (three) times daily. )   . blood glucose meter kit and supplies KIT Dispense based on patient and insurance preference. Use up to four times daily as directed. (FOR ICD-9 250.00, 250.01).   . busPIRone (BUSPAR) 10 MG tablet Take 1 tablet (10 mg total) by mouth 3 (three) times daily.   . diclofenac Sodium (VOLTAREN) 1 % GEL Apply 2 g topically 4 (four) times daily.   [name] Kitchen diltiazem (CARDIZEM  CD) 120 MG 24 hr capsule Take 1 capsule (120 mg total) by mouth 2 (two) times daily.   [name] Kitchen docusate sodium (COLACE) 100 MG capsule Take 200 mg by mouth 2 (two) times daily.   . empagliflozin (JARDIANCE) 25 MG TABS tablet Take 1 tablet (25 mg total) by mouth daily.   . febuxostat (ULORIC) 40 MG tablet Take 40 mg by mouth at bedtime.    . fluticasone (FLONASE) 50 MCG/ACT nasal spray Place 2 sprays into both nostrils 2 (two) times daily.   . Fluticasone-Umeclidin-Vilant (TRELEGY ELLIPTA) 100-62.5-25  MCG/INH AEPB Inhale 1 puff into the lungs daily.   . furosemide (LASIX) 40 MG tablet TAKE 2 TABLETS (80 MG TOTAL) BY MOUTH 2 (TWO) TIMES DAILY.   [name] Kitchen gabapentin (NEURONTIN) 300 MG capsule Take 2 capsules (600 mg total) by mouth 3 (three) times daily. TAKE 2 CAPSULES (600 MG TOTAL) BY MOUTH in the morning and at lunch and take 3 capsules at night   . glucose blood (ACCU-CHEK AVIVA PLUS) test strip 1 each by Other route 3 (three) times daily. TEST 3 TIMES DAILY   . HYDROcodone-acetaminophen (NORCO) 7.5-325 MG tablet Take 1 tablet by mouth every 6 (six) hours as needed for moderate pain. (Patient taking differently: Take 1 tablet by mouth in the morning, at noon, in the evening, and at bedtime. )   . hydrOXYzine (ATARAX/VISTARIL) 10 MG tablet TAKE 1 TABLET BY MOUTH EVERY DAY (Patient taking differently: Take 10 mg by mouth daily. )   . insulin glargine (LANTUS SOLOSTAR) 100 UNIT/ML Solostar Pen Inject 20 Units into the skin daily before breakfast. 07/27/2019: Half dose before surgery on 6/29  . insulin lispro (HUMALOG KWIKPEN) 100 UNIT/ML KwikPen Inject 0.08 mLs (8 Units total) into the skin 3 (three) times daily before meals.   . Insulin Pen Needle (B-D UF III MINI PEN NEEDLES) 31G X 5 MM MISC CHECK SUGARS 4 TIMES A DAY BEFORE MEALS AND AT BEDTIME.   [name] Kitchen ketoconazole (NIZORAL) 2 % cream APPLY 1 FINGERTIP AMOUNT TO EACH FOOT DAILY. (Patient taking differently: Apply 1 application topically daily. )   . loratadine (CLARITIN) 10 MG tablet TAKE 1 TABLET BY MOUTH EVERY DAY (Patient taking differently: Take 10 mg by mouth daily. )   . losartan (COZAAR) 100 MG tablet Take 1 tablet (100 mg total) by mouth daily.   . metolazone (ZAROXOLYN) 2.5 MG tablet Take 1 tablet (2.5 mg total) by mouth 2 (two) times a week. TAKE 1 TABLET TWICE WEEKLY ON MONDAYS AND THURSDAYS ONLY (Patient taking differently: Take 2.5 mg by mouth See admin instructions. TAKE 2.5 BY MOUTH  TWICE WEEKLY ON MONDAYS AND THURSDAYS ONLY)   . montelukast  (SINGULAIR) 10 MG tablet TAKE 1 TABLET BY MOUTH EVERYDAY AT BEDTIME (Patient taking differently: Take 10 mg by mouth at bedtime. )   . Multiple Vitamins-Minerals (MULTIVITAMIN WITH MINERALS) tablet Take 1 tablet by mouth daily.   . mupirocin ointment (BACTROBAN) 2 % APPLY TO AFFECTED AREA TWICE A DAY (Patient taking differently: Apply 1 application topically 2 (two) times daily as needed (skin fold (stomach)). )   . NARCAN 4 MG/0.1ML LIQD nasal spray kit Place 1 spray into the nose as needed (accidental overdose).  07/27/2019: Never used  . nicotine polacrilex (NICORETTE) 4 MG gum CHEW 1 EACH (4 MG TOTAL) BY MOUTH AS NEEDED FOR SMOKING CESSATION. (Patient taking differently: Take 4 mg by mouth in the morning and at bedtime. )   . NON FORMULARY Uses a C-PAP at  bedtime   . nystatin (MYCOSTATIN) 100000 UNIT/ML suspension Take 5 mLs (500,000 Units total) by mouth 4 (four) times daily. Swish and swallow. (Patient taking differently: Take 5 mLs by mouth 4 (four) times daily as needed (dry mouth). Swish and swallow.)   . Omega-3 Fatty Acids (FISH OIL) 1000 MG CAPS Take 1,000 mg by mouth daily.    [name] Kitchen omeprazole (PRILOSEC) 20 MG capsule Take 1 capsule (20 mg total) by mouth daily.   . OXYGEN Inhale 2 L into the lungs continuous.    . potassium chloride SA (KLOR-CON M20) 20 MEQ tablet Take 1 tablet (20 mEq total) by mouth 2 (two) times a week. Take 1 tablet twice weekly on Monday and Thursday. (Patient taking differently: Take 20 mEq by mouth See admin instructions. Take 20 mEq by mouth twice weekly on Monday and Thursday.)   . sertraline (ZOLOFT) 100 MG tablet Take 1 tablet (100 mg total) by mouth daily.   . varenicline (CHANTIX CONTINUING MONTH PAK) 1 MG tablet Take 1 tablet (1 mg total) by mouth 2 (two) times daily.   . [DISCONTINUED] atorvastatin (LIPITOR) 40 MG tablet Take 1 tablet (40 mg total) by mouth daily.   . [DISCONTINUED] CHANTIX 0.5 MG tablet TAKE 1 TABLET (0.5 MG TOTAL) BY MOUTH 2 (TWO) TIMES DAILY.    . [DISCONTINUED] diclofenac Sodium (VOLTAREN) 1 % GEL Apply 2 g topically 4 (four) times daily.   . [DISCONTINUED] loratadine (CLARITIN) 10 MG tablet TAKE 1 TABLET BY MOUTH EVERY DAY (Patient taking differently: Take 10 mg by mouth daily. )   . [DISCONTINUED] nicotine polacrilex ([employer] NICOTINE POLACRILEX) 4 MG gum Take 1 each (4 mg total) by mouth as needed for smoking cessation.    Facility-Administered Encounter Medications as of 08/10/2019  Medication  . technetium tetrofosmin (TC-MYOVIEW) injection 12.2 millicurie    Review of patient status, including review of consultants reports, relevant laboratory and other test results, and collaboration with appropriate care team members and the patient's provider was performed as part of comprehensive patient evaluation and provision of care management services.  [name], [place] / [place]   [phone] 4:24 PM

## 2019-08-11 ENCOUNTER — Ambulatory Visit: Payer: Medicare Other | Admitting: Physical Therapy

## 2019-08-11 ENCOUNTER — Other Ambulatory Visit: Payer: Medicare Other

## 2019-08-11 DIAGNOSIS — R768 Other specified abnormal immunological findings in serum: Secondary | ICD-10-CM | POA: Diagnosis not present

## 2019-08-11 DIAGNOSIS — E1122 Type 2 diabetes mellitus with diabetic chronic kidney disease: Secondary | ICD-10-CM | POA: Diagnosis not present

## 2019-08-11 DIAGNOSIS — N1831 Chronic kidney disease, stage 3a: Secondary | ICD-10-CM | POA: Diagnosis not present

## 2019-08-11 DIAGNOSIS — Z79899 Other long term (current) drug therapy: Secondary | ICD-10-CM | POA: Diagnosis not present

## 2019-08-11 DIAGNOSIS — M109 Gout, unspecified: Secondary | ICD-10-CM | POA: Diagnosis not present

## 2019-08-12 ENCOUNTER — Other Ambulatory Visit: Payer: Self-pay

## 2019-08-12 ENCOUNTER — Ambulatory Visit: Payer: Medicare Other | Admitting: Physical Therapy

## 2019-08-12 ENCOUNTER — Encounter: Payer: Self-pay | Admitting: Physical Therapy

## 2019-08-12 ENCOUNTER — Telehealth: Payer: Self-pay

## 2019-08-12 DIAGNOSIS — M25512 Pain in left shoulder: Secondary | ICD-10-CM | POA: Diagnosis not present

## 2019-08-12 DIAGNOSIS — K6289 Other specified diseases of anus and rectum: Secondary | ICD-10-CM

## 2019-08-12 DIAGNOSIS — R278 Other lack of coordination: Secondary | ICD-10-CM

## 2019-08-12 DIAGNOSIS — R29898 Other symptoms and signs involving the musculoskeletal system: Secondary | ICD-10-CM | POA: Diagnosis not present

## 2019-08-12 DIAGNOSIS — M5090 Cervical disc disorder, unspecified, unspecified cervical region: Secondary | ICD-10-CM | POA: Diagnosis not present

## 2019-08-12 DIAGNOSIS — M6281 Muscle weakness (generalized): Secondary | ICD-10-CM

## 2019-08-12 DIAGNOSIS — Z9889 Other specified postprocedural states: Secondary | ICD-10-CM | POA: Diagnosis not present

## 2019-08-12 DIAGNOSIS — G4733 Obstructive sleep apnea (adult) (pediatric): Secondary | ICD-10-CM | POA: Diagnosis not present

## 2019-08-12 NOTE — Therapy (Signed)
[facility] [address], [place], [place], [address] Phone: [phone]   Fax:  [phone]  Physical Therapy Evaluation  Patient Details  Name: [name] MRN: [id number] Date of Birth: [birth year] Referring Provider (PT): Dr.  Thornton Park   Encounter Date: 08/12/2019   PT End of Session - 08/12/19 1245    Visit Number 7   shoulder 6; pelvic 1   Date for PT Re-Evaluation 11/04/19   pelvic   Authorization Type UHC MCR and MCD    PT Start Time 3846    PT Stop Time 1215    PT Time Calculation (min) 30 min    Activity Tolerance Patient tolerated treatment well;No increased pain    Behavior During Therapy WFL for tasks assessed/performed           Past Medical History:  Diagnosis Date  . Alcoholism ([place])   . Anxiety   . Arthritis 04-10-11   hips, shoulders, back  . Asthma   . Bipolar disorder ([place])   . Cardiomegaly   . Carpal tunnel syndrome, bilateral   . Cervical cancer ([place]) 1993   cervical, no treatment done, went away per pt  . Cervical dysplasia or atypia 04-10-11   '93- once dx.-got pregnant-no intervention, then postpartum, no dysplasia found  . CHF (congestive heart failure) ([place])    no cardiologist 2014 dx, none now  . Chronic hypoxemic respiratory failure ([place])   . Chronic kidney disease    stage 3 kidney disease  . 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 Pathology showed Condyloma, gluteal CONDYLOMA ACUMINATUM.   [name] Kitchen COPD (chronic obstructive pulmonary disease) ([place])   . Depression   . Diabetes mellitus without complication ([place])   . Dyspnea    with actity, sitting  . Fall 12/16/2016  . Fibrocystic breast disease   . GERD (gastroesophageal reflux disease)   . Grade I diastolic dysfunction 65/99/3570   Noted on ECHO  . Hepatitis    hep B-count is low at present,doesn't register  . History of PSVT (paroxysmal supraventricular tachycardia)   . Hypercalcemia    . Hyperlipidemia   . Hypertension   . Incomplete left bundle branch block (LBBB) 09/24/2018   Noted on EKG  . Lung nodule 11/2017   right and left lung  . Migraine headache    none recent  . Morbid obesity ([place]) 03/27/2006  . Neuropathy   . Pneumonia    walking pneu 15 yrs. ago  . Skin lesion 03/15/2011   In gluteal crease now s/p removal by Dr. [name] of General Surgery on 3/12. Path shows condyloma.     . Sleep apnea 04-10-11   uses cpap, pt does not know settings  . Trigger finger    left third  . Urge incontinence of urine     Past Surgical History:  Procedure Laterality Date  . ANAL FISTULECTOMY  04/17/2011   Procedure: FISTULECTOMY ANAL;  Surgeon: Imogene Burn. [name], MD;  Location: [facility];  Service: General;  Laterality: N/A;  Excision of Condyloma Gluteal Cleft   . ANAL RECTAL MANOMETRY N/A 03/26/2019   Procedure: ANO RECTAL MANOMETRY;  Surgeon: Thornton Park, MD;  Location: [facility];  Service: Gastroenterology;  Laterality: N/A;  . BIOPSY  03/09/2019   Procedure: BIOPSY;  Surgeon: Thornton Park, MD;  Location: [facility];  Service: Gastroenterology;;  . Lum [place] INJECTION N/A 12/29/2018   Procedure: BOTOX INJECTION(100 UNITS)  WITH CYSTOSCOPY;  Surgeon: Ceasar Mons,  MD;  Location: [facility];  Service: Urology;  Laterality: N/A;  . BOTOX INJECTION N/A 07/27/2019   Procedure: BOTOX INJECTION WITH CYSTOSCOPY;  Surgeon: Ceasar Mons, MD;  Location: [facility];  Service: Urology;  Laterality: N/A;  ONLY NEEDS 30 MIN  . BREAST CYST EXCISION     bilateral breast, 3 cysts removed from each breast  . BREAST EXCISIONAL BIOPSY Right    x 3  . BREAST EXCISIONAL BIOPSY Left    x 3  . CARPAL TUNNEL RELEASE Bilateral   . Cervical biospy    . CERVICAL FUSION  04-10-11   x3- cervical fusion with plating and screws-Dr. [name]  . COLONOSCOPY WITH PROPOFOL N/A 07/06/2015   Procedure: COLONOSCOPY WITH PROPOFOL;  Surgeon: [name], MD;  [facility];   Service: Endoscopy;  Laterality: N/A;  . COLONOSCOPY WITH PROPOFOL N/A 03/09/2019   Procedure: COLONOSCOPY WITH PROPOFOL;  Surgeon: Thornton Park, MD;  Location[facility];  Service: Gastroenterology;  Laterality: N/A;  . DOPPLER ECHOCARDIOGRAPHY  04/06/2012   AT [facility] 55-60%  . FOOT SURGERY Left 2018  . FRACTURE SURGERY     little toe left foot  . I & D EXTREMITY Left 12/18/2016   Procedure: IRRIGATION AND DEBRIDEMENT LEFT FOOT, CLOSURE;  Surgeon: [name], MD;  Location: [place] [facility];  Service: Orthopedics;  Laterality: Left;  . [place] SURGERY  04-10-11   x5-Lumbar fusion-retained hardware.(Dr. Louanne Skye)  . MULTIPLE TOOTH EXTRACTIONS    . POLYPECTOMY  03/09/2019   Procedure: POLYPECTOMY;  Surgeon: Thornton Park, MD;  [facility];  Service: Gastroenterology;;  . SHOULDER ARTHROSCOPY WITH ROTATOR CUFF REPAIR AND SUBACROMIAL DECOMPRESSION Left 05/07/2019   Procedure: LEFT SHOULDER ARTHROSCOPY WITH DEBRIDEMENT, DISTAL CLAVICLE EXCISION,  SUBACROMIAL DECOMPRESSION AND POSSIBLE ROTATOR CUFF REPAIR;  Surgeon: [name], MD;  Location: [facility];  Service: Orthopedics;  Laterality: Left;  . TEE WITHOUT CARDIOVERSION N/A 04/06/2012   Procedure: TRANSESOPHAGEAL ECHOCARDIOGRAM (TEE);  Surgeon: [name], MD;  Location: [facility];  Service: Cardiovascular;  Laterality: N/A;  . TRIGGER FINGER RELEASE Right    middle finger  . TRIGGER FINGER RELEASE Left 06/28/2016   Procedure: RELEASE TRIGGER FINGER LEFT 3RD FINGER;  Surgeon: [name], MD;  Location: [facility];  Service: Orthopedics;  Laterality: Left;  . TRIGGER FINGER RELEASE Right 06/12/2016   Procedure: RIGHT INDEX FINGER TRIGGER RELEASE;  Surgeon: [name], MD;  Location: [facility];  Service: Orthopedics;  Laterality: Right;  . TRIGGER FINGER RELEASE Right 01/19/2018   Procedure: RIGHT RING FINGER TRIGGER FINGER RELEASE;  Surgeon: [name], MD;  Location: [facility];  Service: Orthopedics;  Laterality: Right;    . TRIGGER FINGER RELEASE Left 05/07/2019   Procedure: RELEASE TRIGGER FINGER LEFT INDEX FINGER;  Surgeon: [name], MD;  Location: [facility];  Service: Orthopedics;  Laterality: Left;    There were no vitals filed for this visit.    Subjective Assessment - 08/12/19 1150    Subjective Patient reports in 03/2019 she was blowing her nose and felt a bump popped by her anus. The bump gets bigger and bigger. MD wanted her to take Metamucil. She ran out and took a daily fiber and put a cramp in the abdomen and hurt. He rrectum will burn when staning too long.  Presently she gets cramps in the abdomen after she urinates. REctal area is not painful ust discomfort.    Pertinent History lengthy, see snapshot.  Kidney disease, L SAD,  DCR and supraspinatus repair 05/07/19, 2 L 02 at all times    Limitations House hold activities;Lifting;Standing;Walking    Patient Stated Goals have the anal discomfort go away    Currently in Pain? No/denies              [facility] PT Assessment - 08/12/19 0001      Assessment   Medical Diagnosis K62.89 Rectal pain    Referring Provider (PT) Dr.  Thornton Park    Onset Date/Surgical Date 03/15/19    Prior Therapy none for the rectum      Precautions   Precautions Other (comment)    Precaution Comments cervical cancer, on oxygen at home      Restrictions   Weight Bearing Restrictions No      Balance Screen   Has the patient fallen in the past 6 months No    Has the patient had a decrease in activity level because of a fear of falling?  No    Is the patient reluctant to leave their home because of a fear of falling?  No      Prior Function   Level of Independence Independent with basic ADLs;Independent with household mobility without device;Independent with community mobility with device    Vocation On disability    Leisure walking      Cognition   Overall Cognitive Status Within Functional Limits for tasks assessed      Posture/Postural Control    Posture/Postural Control Postural limitations    Postural Limitations Rounded Shoulders;Forward head    Posture Comments scapular abduction , gH jt internal rotation       ROM / Strength   AROM / PROM / Strength AROM;PROM;Strength      Strength   Overall Strength Comments right hip abduction, flexion 3+/5                      Objective measurements completed on examination: See above findings.     Pelvic Floor Special Questions - 08/12/19 0001    Prior Pregnancies Yes    Number of Vaginal Deliveries 1    Urinary Leakage No   has BOTOX tx   Fecal incontinence No    Pelvic Floor Internal Exam --    Exam Type Deferred            [facility] Adult PT Treatment/Exercise - 08/12/19 0001      Self-Care   Self-Care Other Self-Care Comments    Other Self-Care Comments  sit on a tennis ball to massage the right levator ani muscle to reduce the discomfort                  PT Education - 08/12/19 1244    Education Details instructed patient on how to use a tennis ball to massage the right levator ani to reduce the discomfort    Person(s) Educated Patient    Methods Explanation;Demonstration    Comprehension Verbalized understanding;Returned demonstration            PT Short Term Goals - 07/29/19 1106      PT SHORT TERM GOAL #1   Title Pt will be I with HEP for strength and ROM within limits of surgical protocol    Time 4    Period Weeks    Status Achieved    Target Date 06/21/19      PT SHORT TERM GOAL #2   Title Pt will be able to improve transfers in the clinic with  mod I (stand-pivot and bed mobility)    Baseline independent    Time 4    Period Weeks    Status Achieved      PT SHORT TERM GOAL #3   Title Pt will be able to use L UE to assist with ADLs below shoulder height with min increase in pain    Time 4    Period Weeks    Status Achieved    Target Date 06/21/19      PT SHORT TERM GOAL #4   Title deferred    Baseline --    Time --     Period --    Status --             PT Long Term Goals - 08/12/19 1218      Additional Long Term Goals   Additional Long Term Goals Yes      PT LONG TERM GOAL #6   Title reduce the discomfort in the right levator ani to minimal to none    Time 12    Period Weeks    Status New    Target Date 11/04/19      PT LONG TERM GOAL #7   Title able to fully relax to have a bowel movement with no discomfort    Time 12    Period Weeks    Status New    Target Date 11/04/19      PT LONG TERM GOAL #8   Title independent with discomfort management techniques and pelvic floor control    Time 12    Period Weeks    Status New    Target Date 11/04/19                  Plan - 08/12/19 1225    Clinical Impression Statement Patient  is a 54 year old female with rectal pain since 03/2019. She was blowing her nose and felt a bulge. Her bulge has resolved but now has a contant discomfort in the right levator ani muscle. Patient reports it is there all the time and not change. Patient deferred for internal rectal exam to assess muscle control and for trigger points. She reported when the doctor did an internal assessment there was not muscle tenderness. Patient has weakness in the right hip. Patient had tenderness located in the right levator ani muscles. The anal mamometry showed dyssynergia defecation with rectal fullness. Patient will benefit from skilled therapy to reduce the right levator ani discomfort and improve coordination of the anal shincter.    Personal Factors and Comorbidities Comorbidity 3+;Past/Current Experience;Social Background;Finances;Behavior Pattern    Comorbidities previous neck and back surgeries, morbid obesity, O2 dependent , lives alone    Examination-Activity Limitations Bed Mobility;Reach Overhead;Locomotion Level;Transfers;Lift;Hygiene/Grooming;Toileting;Stand;Dressing;Stairs;Squat;Carry;Bend    Examination-Participation Restrictions Church;Meal  Prep;Cleaning;Community Activity;Driving;Interpersonal Relationship;Laundry;Shop    Stability/Clinical Decision Making Stable/Uncomplicated    Rehab Potential Excellent    PT Frequency 1x / week    PT Duration 12 weeks    PT Treatment/Interventions ADLs/Self Care Home Management;Patient/family education;Functional mobility training;Therapeutic activities;Moist Heat;Therapeutic exercise;Cryotherapy;Electrical Stimulation;Manual techniques;Neuromuscular re-education;Taping;Passive range of motion    PT Next Visit Plan pelvic floor EMG for pelvic floor contraction, how long she can contract, control of contraction    PT Home Exercise Plan --    Consulted and Agree with Plan of Care Patient           Patient will benefit from skilled therapeutic intervention in order to improve the following deficits and impairments:  Abnormal  gait, Decreased range of motion, Difficulty walking, Obesity, Impaired UE functional use, Decreased endurance, Cardiopulmonary status limiting activity, Decreased activity tolerance, Pain, Impaired flexibility, Decreased balance, Decreased strength, Decreased mobility, Postural dysfunction, Increased muscle spasms  Visit Diagnosis: Muscle weakness (generalized) - Plan: PT plan of care cert/re-cert  Other lack of coordination - Plan: PT plan of care cert/re-cert  Rectal pain - Plan: PT plan of care cert/re-cert     Problem List Patient Active Problem List   Diagnosis Date Noted  . Median nerve compression at elbow, left 08/06/2019  . Diabetic neuropathy associated with type 2 diabetes mellitus ([place]) 08/06/2019  . Body mass index 50.0-59.9, adult ([place]) 06/12/2019  . Impingement syndrome of left shoulder 05/07/2019  . Polyp of colon   . Polyp of ascending colon   . Rectal discomfort 02/12/2019  . Osteoarthritis of right hip 02/12/2019  . Nontraumatic tear of left supraspinatus tendon 12/30/2018  . Gout 12/17/2018  . Nontraumatic complete tear of right rotator  cuff 12/08/2018  . Rotator cuff tear, right 12/01/2018  . Chest pain 09/24/2018  . Right groin pain 07/20/2018  . Depressed mood 07/13/2018  . Mouth pain 06/02/2018  . Paroxysmal SVT (supraventricular tachycardia) ([place]) 03/20/2018  . Pure hypercholesterolemia 03/20/2018  . Muscle cramping 02/25/2018  . Nodule of upper lobe of right lung 02/13/2018  . Falls, subsequent encounter 01/30/2018  . Pain in left foot 11/04/2017  . Open wound of left foot 11/04/2017  . Essential hypertension 10/27/2017  . Solitary pulmonary nodule 10/07/2017  . Restrictive lung disease secondary to obesity 10/01/2017  . Polycythemia, secondary 10/01/2017  . Chronic viral hepatitis B without delta-agent ([place]) 07/08/2017  . COPD (chronic obstructive pulmonary disease) with chronic bronchitis ([place]) 06/27/2017  . Chronic kidney disease (CKD), stage IV (severe) ([place]) 01/14/2017  . Type 2 diabetes mellitus with stage 3 chronic kidney disease, with long-term current use of insulin ([place]) 12/12/2016  . Urge incontinence of urine 12/05/2016  . Trigger finger, left index finger 07/15/2016  . Back pain 04/07/2014  . Morbid obesity ([place]) 10/02/2012  . Chronic diastolic CHF (congestive heart failure) ([place]) 04/07/2012  . GERD 01/26/2010  . Obstructive sleep apnea treated with BiPAP 02/03/2008  . FIBROCYSTIC BREAST DISEASE 10/28/2006  . Hyperlipidemia 03/27/2006  . Obesity hypoventilation syndrome ([place]) 03/27/2006  . Former tobacco use 03/27/2006  . Depression with anxiety 03/27/2006    Earlie Counts, PT 08/12/19 12:49 PM   [facility] [address], [place], [place], [address] Phone: [phone]   Fax:  [phone]  Name: [name] MRN: [id number] Date of Birth: 1965/10/03

## 2019-08-12 NOTE — Telephone Encounter (Signed)
Clinical info completed on [employer] form.  Place form in Dr. Hulen Luster box for completion.  [name], CMA

## 2019-08-13 ENCOUNTER — Ambulatory Visit: Payer: Medicare Other | Admitting: Physical Therapy

## 2019-08-13 ENCOUNTER — Other Ambulatory Visit: Payer: Self-pay | Admitting: Cardiology

## 2019-08-13 ENCOUNTER — Other Ambulatory Visit: Payer: Self-pay

## 2019-08-13 VITALS — BP 126/79 | HR 133

## 2019-08-13 DIAGNOSIS — K6289 Other specified diseases of anus and rectum: Secondary | ICD-10-CM | POA: Diagnosis not present

## 2019-08-13 DIAGNOSIS — M6281 Muscle weakness (generalized): Secondary | ICD-10-CM

## 2019-08-13 DIAGNOSIS — R29898 Other symptoms and signs involving the musculoskeletal system: Secondary | ICD-10-CM | POA: Diagnosis not present

## 2019-08-13 DIAGNOSIS — M25512 Pain in left shoulder: Secondary | ICD-10-CM

## 2019-08-13 DIAGNOSIS — Z9889 Other specified postprocedural states: Secondary | ICD-10-CM | POA: Diagnosis not present

## 2019-08-13 DIAGNOSIS — R278 Other lack of coordination: Secondary | ICD-10-CM | POA: Diagnosis not present

## 2019-08-13 DIAGNOSIS — R8279 Other abnormal findings on microbiological examination of urine: Secondary | ICD-10-CM | POA: Diagnosis not present

## 2019-08-13 NOTE — Therapy (Signed)
[name], [place], [address] Phone: [phone]   Fax:  [phone]  Physical Therapy Treatment  Patient Details  Name: [name] MRN: [id number] Date of Birth: 1965/10/22 Referring Provider (PT): Dr.  Thornton Park   Encounter Date: 08/13/2019   PT End of Session - 08/13/19 0900    Visit Number 8    Number of Visits 16    Date for PT Re-Evaluation 11/04/19    Authorization Type [employer] MCR and MCD    PT Start Time 0835    PT Stop Time 0900    PT Time Calculation (min) 25 min    Activity Tolerance Patient limited by fatigue;Patient limited by pain;Treatment limited secondary to medical complications (Comment)    Behavior During Therapy [facility] for tasks assessed/performed           Past Medical History:  Diagnosis Date  . Alcoholism ([place])   . Anxiety   . Arthritis 04-10-11   hips, shoulders, back  . Asthma   . Bipolar disorder ([place])   . Cardiomegaly   . Carpal tunnel syndrome, bilateral   . Cervical cancer ([place]) 1993   cervical, no treatment done, went away per pt  . Cervical dysplasia or atypia 04-10-11   '93- once dx.-got pregnant-no intervention, then postpartum, no dysplasia found  . CHF (congestive heart failure) ([place])    no cardiologist 2014 dx, none now  . Chronic hypoxemic respiratory failure ([place])   . Chronic kidney disease    stage 3 kidney disease  . 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 Pathology showed Condyloma, gluteal CONDYLOMA ACUMINATUM.   [name] Kitchen COPD (chronic obstructive pulmonary disease) ([place])   . Depression   . Diabetes mellitus without complication ([place])   . Dyspnea    with actity, sitting  . Fall 12/16/2016  . Fibrocystic breast disease   . GERD (gastroesophageal reflux disease)   . Grade I diastolic dysfunction 69/62/9528   Noted on ECHO  . Hepatitis    hep B-count is low at present,doesn't register  . History of PSVT (paroxysmal  supraventricular tachycardia)   . Hypercalcemia   . Hyperlipidemia   . Hypertension   . Incomplete left bundle branch block (LBBB) 09/24/2018   Noted on EKG  . Lung nodule 11/2017   right and left lung  . Migraine headache    none recent  . Morbid obesity ([place]) 03/27/2006  . Neuropathy   . Pneumonia    walking pneu 15 yrs. ago  . Skin lesion 03/15/2011   In gluteal crease now s/p removal by Dr. [name] of General Surgery on 3/12. Path shows condyloma.     . Sleep apnea 04-10-11   uses cpap, pt does not know settings  . Trigger finger    left third  . Urge incontinence of urine     Past Surgical History:  Procedure Laterality Date  . ANAL FISTULECTOMY  04/17/2011   Procedure: FISTULECTOMY ANAL;  Surgeon: Imogene Burn. [name], MD;  Location: [facility];  Service: General;  Laterality: N/A;  Excision of Condyloma Gluteal Cleft   . ANAL RECTAL MANOMETRY N/A 03/26/2019   Procedure: ANO RECTAL MANOMETRY;  Surgeon: Thornton Park, MD;  [facility]: [facility];  Service: Gastroenterology;  Laterality: N/A;  . BIOPSY  03/09/2019   Procedure: BIOPSY;  Surgeon: Thornton Park, MD;  [facility];  Service: Gastroenterology;;  . BOTOX INJECTION N/A 12/29/2018   Procedure: BOTOX INJECTION(100 UNITS)  WITH CYSTOSCOPY;  Surgeon: Ceasar Mons, MD;  Location: [facility];  Service: Urology;  Laterality: N/A;  . BOTOX INJECTION N/A 07/27/2019   Procedure: BOTOX INJECTION WITH CYSTOSCOPY;  Surgeon: Ceasar Mons, MD;  Location: [facility];  Service: Urology;  Laterality: N/A;  ONLY NEEDS 30 MIN  . BREAST CYST EXCISION     bilateral breast, 3 cysts removed from each breast  . BREAST EXCISIONAL BIOPSY Right    x 3  . BREAST EXCISIONAL BIOPSY Left    x 3  . CARPAL TUNNEL RELEASE Bilateral   . Cervical biospy    . CERVICAL FUSION  04-10-11   x3- cervical fusion with plating and screws-Dr. [name]  . COLONOSCOPY WITH PROPOFOL N/A 07/06/2015   Procedure: COLONOSCOPY WITH PROPOFOL;   Surgeon: [name], MD;  [facility];  Service: Endoscopy;  Laterality: N/A;  . COLONOSCOPY WITH PROPOFOL N/A 03/09/2019   Procedure: COLONOSCOPY WITH PROPOFOL;  Surgeon: Thornton Park, MD;  [facility];  Service: Gastroenterology;  Laterality: N/A;  . DOPPLER ECHOCARDIOGRAPHY  04/06/2012   AT [place] 55-60%  . FOOT SURGERY Left 2018  . FRACTURE SURGERY     little toe left foot  . I & D EXTREMITY Left 12/18/2016   Procedure: IRRIGATION AND DEBRIDEMENT LEFT FOOT, CLOSURE;  Surgeon: [name], MD;  Location: [place];  Service: Orthopedics;  Laterality: Left;  . [place] SURGERY  04-10-11   x5-Lumbar fusion-retained hardware.(Dr. Louanne Skye)  . MULTIPLE TOOTH EXTRACTIONS    . POLYPECTOMY  03/09/2019   Procedure: POLYPECTOMY;  Surgeon: Thornton Park, MD;  [facility];  Service: Gastroenterology;;  . SHOULDER ARTHROSCOPY WITH ROTATOR CUFF REPAIR AND SUBACROMIAL DECOMPRESSION Left 05/07/2019   Procedure: LEFT SHOULDER ARTHROSCOPY WITH DEBRIDEMENT, DISTAL CLAVICLE EXCISION,  SUBACROMIAL DECOMPRESSION AND POSSIBLE ROTATOR CUFF REPAIR;  Surgeon: [name], MD;  Location: [place];  Service: Orthopedics;  Laterality: Left;  . TEE WITHOUT CARDIOVERSION N/A 04/06/2012   Procedure: TRANSESOPHAGEAL ECHOCARDIOGRAM (TEE);  Surgeon: [name], MD;  [facility];  Service: Cardiovascular;  Laterality: N/A;  . TRIGGER FINGER RELEASE Right    middle finger  . TRIGGER FINGER RELEASE Left 06/28/2016   Procedure: RELEASE TRIGGER FINGER LEFT 3RD FINGER;  Surgeon: [name], MD;  Location: [place];  Service: Orthopedics;  Laterality: Left;  . TRIGGER FINGER RELEASE Right 06/12/2016   Procedure: RIGHT INDEX FINGER TRIGGER RELEASE;  Surgeon: [name], MD;  Location: [place];  Service: Orthopedics;  Laterality: Right;  . TRIGGER FINGER RELEASE Right 01/19/2018   Procedure: RIGHT RING FINGER TRIGGER FINGER RELEASE;  Surgeon: [name], MD;  Location:  [place];  Service: Orthopedics;  Laterality: Right;  . TRIGGER FINGER RELEASE Left 05/07/2019   Procedure: RELEASE TRIGGER FINGER LEFT INDEX FINGER;  Surgeon: [name], MD;  Location: [place];  Service: Orthopedics;  Laterality: Left;    Vitals:   08/13/19 0836 08/13/19 0856 08/13/19 0858  BP: 126/79    Pulse: (!) 48 (!) 133   SpO2: 93% (!) 83% 95%     Subjective Assessment - 08/13/19 0836    Subjective Patient here today with 10/10 , increased the past few days.  L shoulder was used to lift a bag for her exercise to strengthen.    Currently in Pain? Yes    Pain Score 10-Worst pain ever    Pain Location Shoulder    Pain Orientation Left  [place] Adult PT Treatment/Exercise - 08/13/19 0001      Shoulder Exercises: Standing   Other Standing Exercises row 4 lbs x 20 LUE cues       Shoulder Exercises: Pulleys   Flexion 3 minutes      Shoulder Exercises: ROM/Strengthening   Other ROM/Strengthening Exercises standing core work with bilateral UE press into ball in standing x 10, has to use the bathroom and vitals taken                     PT Short Term Goals - 07/29/19 1106      PT SHORT TERM GOAL #1   Title Pt will be I with HEP for strength and ROM within limits of surgical protocol    Time 4    Period Weeks    Status Achieved    Target Date 06/21/19      PT SHORT TERM GOAL #2   Title Pt will be able to improve transfers in the clinic with mod I (stand-pivot and bed mobility)    Baseline independent    Time 4    Period Weeks    Status Achieved      PT SHORT TERM GOAL #3   Title Pt will be able to use L UE to assist with ADLs below shoulder height with min increase in pain    Time 4    Period Weeks    Status Achieved    Target Date 06/21/19      PT SHORT TERM GOAL #4   Title deferred    Baseline --    Time --    Period --    Status --             PT Long Term Goals - 08/12/19 1218      Additional Long Term Goals   Additional  Long Term Goals Yes      PT LONG TERM GOAL #6   Title reduce the discomfort in the right levator ani to minimal to none    Time 12    Period Weeks    Status New    Target Date 11/04/19      PT LONG TERM GOAL #7   Title able to fully relax to have a bowel movement with no discomfort    Time 12    Period Weeks    Status New    Target Date 11/04/19      PT LONG TERM GOAL #8   Title independent with discomfort management techniques and pelvic floor control    Time 12    Period Weeks    Status New    Target Date 11/04/19                 Plan - 08/13/19 0906    Clinical Impression Statement Patient with increased shoulder pain and bradycardia initially, O2 sats >90%.  With minimal standing activity, walking to and from bathroom, HR increased to 133 with O2 sats 83-84%. Patient felt tired and would like to end session. She expressed frustration with being taken off the wait list for her portable O2 tank.  Patient has 2-3 appts per day and really needs portable 02 so she can be more independent and reduce strain on L UE.    PT Treatment/Interventions ADLs/Self Care Home Management;Patient/family education;Functional mobility training;Therapeutic activities;Moist Heat;Therapeutic exercise;Cryotherapy;Electrical Stimulation;Manual techniques;Neuromuscular re-education;Taping;Passive range of motion    PT Next Visit Plan L shoulder: pain control, AROM, strengthen as tolerated.  pelvic floor: EMG for pelvic floor contraction, how long she can contract, control of contraction    Consulted and Agree with Plan of Care Patient           Patient will benefit from skilled therapeutic intervention in order to improve the following deficits and impairments:  Abnormal gait, Decreased range of motion, Difficulty walking, Obesity, Impaired UE functional use, Decreased endurance, Cardiopulmonary status limiting activity, Decreased activity tolerance, Pain, Impaired flexibility, Decreased balance,  Decreased strength, Decreased mobility, Postural dysfunction, Increased muscle spasms  Visit Diagnosis: Muscle weakness (generalized)  Acute pain of left shoulder  S/P left rotator cuff repair     Problem List Patient Active Problem List   Diagnosis Date Noted  . Median nerve compression at elbow, left 08/06/2019  . Diabetic neuropathy associated with type 2 diabetes mellitus ([place]) 08/06/2019  . Body mass index 50.0-59.9, adult ([place]) 06/12/2019  . Impingement syndrome of left shoulder 05/07/2019  . Polyp of colon   . Polyp of ascending colon   . Rectal discomfort 02/12/2019  . Osteoarthritis of right hip 02/12/2019  . Nontraumatic tear of left supraspinatus tendon 12/30/2018  . Gout 12/17/2018  . Nontraumatic complete tear of right rotator cuff 12/08/2018  . Rotator cuff tear, right 12/01/2018  . Chest pain 09/24/2018  . Right groin pain 07/20/2018  . Depressed mood 07/13/2018  . Mouth pain 06/02/2018  . Paroxysmal SVT (supraventricular tachycardia) ([place]) 03/20/2018  . Pure hypercholesterolemia 03/20/2018  . Muscle cramping 02/25/2018  . Nodule of upper lobe of right lung 02/13/2018  . Falls, subsequent encounter 01/30/2018  . Pain in left foot 11/04/2017  . Open wound of left foot 11/04/2017  . Essential hypertension 10/27/2017  . Solitary pulmonary nodule 10/07/2017  . Restrictive lung disease secondary to obesity 10/01/2017  . Polycythemia, secondary 10/01/2017  . Chronic viral hepatitis B without delta-agent ([place]) 07/08/2017  . COPD (chronic obstructive pulmonary disease) with chronic bronchitis ([place]) 06/27/2017  . Chronic kidney disease (CKD), stage IV (severe) ([place]) 01/14/2017  . Type 2 diabetes mellitus with stage 3 chronic kidney disease, with long-term current use of insulin ([place]) 12/12/2016  . Urge incontinence of urine 12/05/2016  . Trigger finger, left index finger 07/15/2016  . Back pain 04/07/2014  . Morbid obesity ([place]) 10/02/2012  . Chronic diastolic  CHF (congestive heart failure) ([place]) 04/07/2012  . GERD 01/26/2010  . Obstructive sleep apnea treated with BiPAP 02/03/2008  . FIBROCYSTIC BREAST DISEASE 10/28/2006  . Hyperlipidemia 03/27/2006  . Obesity hypoventilation syndrome ([place]) 03/27/2006  . Former tobacco use 03/27/2006  . Depression with anxiety 03/27/2006    Maliyah Willets 08/13/2019, 9:11 AM  [place], [place], [address] Phone: [phone]   Fax:  [phone]  Name: [name] MRN: [id number] Date of Birth: 21-Jul-1965  Raeford Razor, PT 08/13/19 9:12 AM Phone: [phone] Fax: [phone]

## 2019-08-13 NOTE — Telephone Encounter (Signed)
Spoke with patient regarding her paperwork and informed her that it will take me a little bit of time to fill out her forms because they require a diagnosis with a date of diagnosis and ICD-10 code for all of her health issues.  I will work on this and get the paperwork completed as soon as possible.

## 2019-08-15 ENCOUNTER — Encounter: Payer: Self-pay | Admitting: Physical Therapy

## 2019-08-16 ENCOUNTER — Ambulatory Visit: Payer: Medicare Other | Admitting: Psychology

## 2019-08-17 ENCOUNTER — Ambulatory Visit: Payer: Medicare Other | Admitting: Physical Therapy

## 2019-08-17 ENCOUNTER — Telehealth: Payer: Self-pay | Admitting: Family Medicine

## 2019-08-17 ENCOUNTER — Encounter: Payer: Self-pay | Admitting: Physical Therapy

## 2019-08-17 ENCOUNTER — Other Ambulatory Visit: Payer: Self-pay

## 2019-08-17 VITALS — HR 45

## 2019-08-17 DIAGNOSIS — M25512 Pain in left shoulder: Secondary | ICD-10-CM | POA: Diagnosis not present

## 2019-08-17 DIAGNOSIS — R278 Other lack of coordination: Secondary | ICD-10-CM | POA: Diagnosis not present

## 2019-08-17 DIAGNOSIS — Z9889 Other specified postprocedural states: Secondary | ICD-10-CM | POA: Diagnosis not present

## 2019-08-17 DIAGNOSIS — K6289 Other specified diseases of anus and rectum: Secondary | ICD-10-CM | POA: Diagnosis not present

## 2019-08-17 DIAGNOSIS — R29898 Other symptoms and signs involving the musculoskeletal system: Secondary | ICD-10-CM | POA: Diagnosis not present

## 2019-08-17 DIAGNOSIS — M6281 Muscle weakness (generalized): Secondary | ICD-10-CM | POA: Diagnosis not present

## 2019-08-17 NOTE — Therapy (Signed)
[place], [place], [address] Phone: [phone]   Fax:  [phone]  Physical Therapy Treatment  Patient Details  Name: [name] MRN: [id number] Date of Birth: 1965-04-27 Referring Provider (PT): Dr.  Thornton Park   Encounter Date: 08/17/2019   PT End of Session - 08/17/19 0719    Visit Number 9    Number of Visits 16    Date for PT Re-Evaluation 09/13/19    Authorization Type [employer] MCR and MCD    PT Start Time 0715    PT Stop Time 0758    PT Time Calculation (min) 43 min    Activity Tolerance Patient limited by pain    Behavior During Therapy [facility] for tasks assessed/performed           Past Medical History:  Diagnosis Date  . Alcoholism ([place])   . Anxiety   . Arthritis 04-10-11   hips, shoulders, back  . Asthma   . Bipolar disorder ([place])   . Cardiomegaly   . Carpal tunnel syndrome, bilateral   . Cervical cancer ([place]) 1993   cervical, no treatment done, went away per pt  . Cervical dysplasia or atypia 04-10-11   '93- once dx.-got pregnant-no intervention, then postpartum, no dysplasia found  . CHF (congestive heart failure) ([place])    no cardiologist 2014 dx, none now  . Chronic hypoxemic respiratory failure ([place])   . Chronic kidney disease    stage 3 kidney disease  . 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 Pathology showed Condyloma, gluteal CONDYLOMA ACUMINATUM.   [name] Kitchen COPD (chronic obstructive pulmonary disease) ([place])   . Depression   . Diabetes mellitus without complication ([place])   . Dyspnea    with actity, sitting  . Fall 12/16/2016  . Fibrocystic breast disease   . GERD (gastroesophageal reflux disease)   . Grade I diastolic dysfunction 54/56/2563   Noted on ECHO  . Hepatitis    hep B-count is low at present,doesn't register  . History of PSVT (paroxysmal supraventricular tachycardia)   . Hypercalcemia   . Hyperlipidemia   . Hypertension   .  Incomplete left bundle branch block (LBBB) 09/24/2018   Noted on EKG  . Lung nodule 11/2017   right and left lung  . Migraine headache    none recent  . Morbid obesity ([place]) 03/27/2006  . Neuropathy   . Pneumonia    walking pneu 15 yrs. ago  . Skin lesion 03/15/2011   In gluteal crease now s/p removal by Dr. [name] of General Surgery on 3/12. Path shows condyloma.     . Sleep apnea 04-10-11   uses cpap, pt does not know settings  . Trigger finger    left third  . Urge incontinence of urine     Past Surgical History:  Procedure Laterality Date  . ANAL FISTULECTOMY  04/17/2011   Procedure: FISTULECTOMY ANAL;  Surgeon: Imogene Burn. [name], MD;  Location: [facility];  Service: General;  Laterality: N/A;  Excision of Condyloma Gluteal Cleft   . ANAL RECTAL MANOMETRY N/A 03/26/2019   Procedure: ANO RECTAL MANOMETRY;  Surgeon: Thornton Park, MD;  Location: [facility];  Service: Gastroenterology;  Laterality: N/A;  . BIOPSY  03/09/2019   Procedure: BIOPSY;  Surgeon: Thornton Park, MD;  Location: [facility];  Service: Gastroenterology;;  . Lum [place] INJECTION N/A 12/29/2018   Procedure: BOTOX INJECTION(100 UNITS)  WITH CYSTOSCOPY;  Surgeon: Ceasar Mons, MD;  Location: [facility];  Service: Urology;  Laterality: N/A;  . BOTOX INJECTION N/A 07/27/2019   Procedure: BOTOX INJECTION WITH CYSTOSCOPY;  Surgeon: Ceasar Mons, MD;  Location: [facility];  Service: Urology;  Laterality: N/A;  ONLY NEEDS 30 MIN  . BREAST CYST EXCISION     bilateral breast, 3 cysts removed from each breast  . BREAST EXCISIONAL BIOPSY Right    x 3  . BREAST EXCISIONAL BIOPSY Left    x 3  . CARPAL TUNNEL RELEASE Bilateral   . Cervical biospy    . CERVICAL FUSION  04-10-11   x3- cervical fusion with plating and screws-Dr. [name]  . COLONOSCOPY WITH PROPOFOL N/A 07/06/2015   Procedure: COLONOSCOPY WITH PROPOFOL;  Surgeon: [name], MD;  [facility];  Service: Endoscopy;  Laterality: N/A;  .  COLONOSCOPY WITH PROPOFOL N/A 03/09/2019   Procedure: COLONOSCOPY WITH PROPOFOL;  Surgeon: Thornton Park, MD;  [facility];  Service: Gastroenterology;  Laterality: N/A;  . DOPPLER ECHOCARDIOGRAPHY  04/06/2012   AT [place] 55-60%  . FOOT SURGERY Left 2018  . FRACTURE SURGERY     little toe left foot  . I & D EXTREMITY Left 12/18/2016   Procedure: IRRIGATION AND DEBRIDEMENT LEFT FOOT, CLOSURE;  Surgeon: [name], MD;  Location: [place];  Service: Orthopedics;  Laterality: Left;  . [place] SURGERY  04-10-11   x5-Lumbar fusion-retained hardware.(Dr. Louanne Skye)  . MULTIPLE TOOTH EXTRACTIONS    . POLYPECTOMY  03/09/2019   Procedure: POLYPECTOMY;  Surgeon: Thornton Park, MD;  [facility];  Service: Gastroenterology;;  . SHOULDER ARTHROSCOPY WITH ROTATOR CUFF REPAIR AND SUBACROMIAL DECOMPRESSION Left 05/07/2019   Procedure: LEFT SHOULDER ARTHROSCOPY WITH DEBRIDEMENT, DISTAL CLAVICLE EXCISION,  SUBACROMIAL DECOMPRESSION AND POSSIBLE ROTATOR CUFF REPAIR;  Surgeon: [name], MD;  Location: [place];  Service: Orthopedics;  Laterality: Left;  . TEE WITHOUT CARDIOVERSION N/A 04/06/2012   Procedure: TRANSESOPHAGEAL ECHOCARDIOGRAM (TEE);  Surgeon: [name], MD;  Location[facility];  Service: Cardiovascular;  Laterality: N/A;  . TRIGGER FINGER RELEASE Right    middle finger  . TRIGGER FINGER RELEASE Left 06/28/2016   Procedure: RELEASE TRIGGER FINGER LEFT 3RD FINGER;  Surgeon: [name], MD;  Location: [place];  Service: Orthopedics;  Laterality: Left;  . TRIGGER FINGER RELEASE Right 06/12/2016   Procedure: RIGHT INDEX FINGER TRIGGER RELEASE;  Surgeon: [name], MD;  Location: [place];  Service: Orthopedics;  Laterality: Right;  . TRIGGER FINGER RELEASE Right 01/19/2018   Procedure: RIGHT RING FINGER TRIGGER FINGER RELEASE;  Surgeon: [name], MD;  Location: [place];  Service: Orthopedics;  Laterality: Right;  . TRIGGER FINGER RELEASE Left 05/07/2019    Procedure: RELEASE TRIGGER FINGER LEFT INDEX FINGER;  Surgeon: [name], MD;  Location: [place];  Service: Orthopedics;  Laterality: Left;    Vitals:   08/17/19 0716  Pulse: (!) 45     Subjective Assessment - 08/17/19 0716    Subjective Pain in left upper arm 10/10. I do not think it was the weighted bag. I think I did it while in my bed.    Currently in Pain? Yes    Pain Score 10-Worst pain ever    Pain Location Shoulder    Pain Orientation Left    Pain Descriptors / Indicators Aching;Throbbing    Pain Type Chronic pain    Aggravating Factors  lifting, getting up out of the bed    Pain Relieving Factors meds do minimal,  ice pack              [facility] PT Assessment - 08/17/19 0001      AROM   Overall AROM Comments still has AROM WFL, pain with abduction /ER reaching                          [facility] Adult PT Treatment/Exercise - 08/17/19 0001      Shoulder Exercises: Seated   Extension 20 reps    Theraband Level (Shoulder Extension) Level 3 (Green)    Horizontal ABduction Strengthening;Right;Left;10 reps;Theraband    Theraband Level (Shoulder Horizontal ABduction) Level 2 (Red)    External Rotation Limitations red band x 20     Internal Rotation Limitations red band x 20     Flexion Limitations 1 lbs overhead press    then 2lbs x 10    Other Seated Exercises ER with elbow propped in abduction 1# x 20, 2# x 20    Other Seated Exercises tricep pull down green x 20      Shoulder Exercises: Standing   Row Strengthening;Both;20 reps    Theraband Level (Shoulder Row) Level 3 (Green)      Shoulder Exercises: Pulleys   Flexion 3 minutes      Manual Therapy   Manual therapy comments STW to left bicep and deltoid                    PT Short Term Goals - 07/29/19 1106      PT SHORT TERM GOAL #1   Title Pt will be I with HEP for strength and ROM within limits of surgical protocol    Time 4    Period Weeks    Status Achieved    Target Date  06/21/19      PT SHORT TERM GOAL #2   Title Pt will be able to improve transfers in the clinic with mod I (stand-pivot and bed mobility)    Baseline independent    Time 4    Period Weeks    Status Achieved      PT SHORT TERM GOAL #3   Title Pt will be able to use L UE to assist with ADLs below shoulder height with min increase in pain    Time 4    Period Weeks    Status Achieved    Target Date 06/21/19      PT SHORT TERM GOAL #4   Title deferred    Baseline --    Time --    Period --    Status --             PT Long Term Goals - 08/12/19 1218      Additional Long Term Goals   Additional Long Term Goals Yes      PT LONG TERM GOAL #6   Title reduce the discomfort in the right levator ani to minimal to none    Time 12    Period Weeks    Status New    Target Date 11/04/19      PT LONG TERM GOAL #7   Title able to fully relax to have a bowel movement with no discomfort    Time 12    Period Weeks    Status New    Target Date 11/04/19      PT LONG TERM GOAL #8   Title independent with discomfort management techniques and pelvic floor control  Time 12    Period Weeks    Status New    Target Date 11/04/19                 Plan - 08/17/19 0808    Clinical Impression Statement SP02 95% and HR 45 bpm. By end of session HR up to 85bpm. Pt c/o continued 10/10 left upper arm pain that was reduced with STW. Able to complete all therex with cues to stop if she felt pain.    PT Next Visit Plan L shoulder: pain control, AROM, strengthen as tolerated.  pelvic floor: EMG for pelvic floor contraction, how long she can contract, control of contraction    Consulted and Agree with Plan of Care Patient           Patient will benefit from skilled therapeutic intervention in order to improve the following deficits and impairments:  Abnormal gait, Decreased range of motion, Difficulty walking, Obesity, Impaired UE functional use, Decreased endurance, Cardiopulmonary status  limiting activity, Decreased activity tolerance, Pain, Impaired flexibility, Decreased balance, Decreased strength, Decreased mobility, Postural dysfunction, Increased muscle spasms  Visit Diagnosis: Muscle weakness (generalized)  Acute pain of left shoulder  S/P left rotator cuff repair     Problem List Patient Active Problem List   Diagnosis Date Noted  . Median nerve compression at elbow, left 08/06/2019  . Diabetic neuropathy associated with type 2 diabetes mellitus ([place]) 08/06/2019  . Body mass index 50.0-59.9, adult ([place]) 06/12/2019  . Impingement syndrome of left shoulder 05/07/2019  . Polyp of colon   . Polyp of ascending colon   . Rectal discomfort 02/12/2019  . Osteoarthritis of right hip 02/12/2019  . Nontraumatic tear of left supraspinatus tendon 12/30/2018  . Gout 12/17/2018  . Nontraumatic complete tear of right rotator cuff 12/08/2018  . Rotator cuff tear, right 12/01/2018  . Chest pain 09/24/2018  . Right groin pain 07/20/2018  . Depressed mood 07/13/2018  . Mouth pain 06/02/2018  . Paroxysmal SVT (supraventricular tachycardia) ([place]) 03/20/2018  . Pure hypercholesterolemia 03/20/2018  . Muscle cramping 02/25/2018  . Nodule of upper lobe of right lung 02/13/2018  . Falls, subsequent encounter 01/30/2018  . Pain in left foot 11/04/2017  . Open wound of left foot 11/04/2017  . Essential hypertension 10/27/2017  . Solitary pulmonary nodule 10/07/2017  . Restrictive lung disease secondary to obesity 10/01/2017  . Polycythemia, secondary 10/01/2017  . Chronic viral hepatitis B without delta-agent ([place]) 07/08/2017  . COPD (chronic obstructive pulmonary disease) with chronic bronchitis ([place]) 06/27/2017  . Chronic kidney disease (CKD), stage IV (severe) ([place]) 01/14/2017  . Type 2 diabetes mellitus with stage 3 chronic kidney disease, with long-term current use of insulin ([place]) 12/12/2016  . Urge incontinence of urine 12/05/2016  . Trigger finger, left index  finger 07/15/2016  . Back pain 04/07/2014  . Morbid obesity ([place]) 10/02/2012  . Chronic diastolic CHF (congestive heart failure) ([place]) 04/07/2012  . GERD 01/26/2010  . Obstructive sleep apnea treated with BiPAP 02/03/2008  . FIBROCYSTIC BREAST DISEASE 10/28/2006  . Hyperlipidemia 03/27/2006  . Obesity hypoventilation syndrome ([place]) 03/27/2006  . Former tobacco use 03/27/2006  . Depression with anxiety 03/27/2006    Dorene Ar, PTA 08/17/2019, 8:12 AM  [place], [place], [address] Phone: [phone]   Fax:  [phone]  Name: [name] MRN: [id number] Date of Birth: 01/21/1966

## 2019-08-18 ENCOUNTER — Telehealth: Payer: Self-pay | Admitting: Family Medicine

## 2019-08-18 DIAGNOSIS — G4733 Obstructive sleep apnea (adult) (pediatric): Secondary | ICD-10-CM | POA: Diagnosis not present

## 2019-08-18 DIAGNOSIS — J449 Chronic obstructive pulmonary disease, unspecified: Secondary | ICD-10-CM | POA: Diagnosis not present

## 2019-08-18 NOTE — Telephone Encounter (Signed)
Called patient to inform her that I had finished her paperwork.  It will be faxed to the number provided.

## 2019-08-20 ENCOUNTER — Ambulatory Visit: Payer: Medicare Other | Admitting: Physical Therapy

## 2019-08-22 ENCOUNTER — Encounter: Payer: Self-pay | Admitting: Physical Therapy

## 2019-08-23 ENCOUNTER — Ambulatory Visit: Payer: Medicare Other | Admitting: Psychology

## 2019-08-23 ENCOUNTER — Other Ambulatory Visit: Payer: Self-pay | Admitting: Family Medicine

## 2019-08-23 ENCOUNTER — Ambulatory Visit: Payer: Medicare Other | Admitting: Physical Therapy

## 2019-08-25 ENCOUNTER — Other Ambulatory Visit: Payer: Self-pay

## 2019-08-25 ENCOUNTER — Ambulatory Visit
Admission: RE | Admit: 2019-08-25 | Discharge: 2019-08-25 | Disposition: A | Discharge status: Home / Self Care | Payer: Medicare Other | Source: Ambulatory Visit | Attending: Nephrology | Admitting: Nephrology

## 2019-08-25 DIAGNOSIS — N261 Atrophy of kidney (terminal): Secondary | ICD-10-CM | POA: Diagnosis not present

## 2019-08-25 DIAGNOSIS — Z79899 Other long term (current) drug therapy: Secondary | ICD-10-CM | POA: Diagnosis not present

## 2019-08-25 DIAGNOSIS — E278 Other specified disorders of adrenal gland: Secondary | ICD-10-CM | POA: Diagnosis not present

## 2019-08-25 DIAGNOSIS — R109 Unspecified abdominal pain: Secondary | ICD-10-CM

## 2019-08-25 DIAGNOSIS — M5136 Other intervertebral disc degeneration, lumbar region: Secondary | ICD-10-CM | POA: Diagnosis not present

## 2019-08-25 DIAGNOSIS — I700 Atherosclerosis of aorta: Secondary | ICD-10-CM | POA: Diagnosis not present

## 2019-08-25 DIAGNOSIS — M5030 Other cervical disc degeneration, unspecified cervical region: Secondary | ICD-10-CM | POA: Diagnosis not present

## 2019-08-25 DIAGNOSIS — G894 Chronic pain syndrome: Secondary | ICD-10-CM | POA: Diagnosis not present

## 2019-08-27 ENCOUNTER — Other Ambulatory Visit: Payer: Self-pay

## 2019-08-27 ENCOUNTER — Encounter: Payer: Self-pay | Admitting: Physical Therapy

## 2019-08-27 ENCOUNTER — Ambulatory Visit: Payer: Medicare Other | Admitting: Physical Therapy

## 2019-08-27 DIAGNOSIS — K6289 Other specified diseases of anus and rectum: Secondary | ICD-10-CM | POA: Diagnosis not present

## 2019-08-27 DIAGNOSIS — M25512 Pain in left shoulder: Secondary | ICD-10-CM

## 2019-08-27 DIAGNOSIS — M6281 Muscle weakness (generalized): Secondary | ICD-10-CM | POA: Diagnosis not present

## 2019-08-27 DIAGNOSIS — R278 Other lack of coordination: Secondary | ICD-10-CM | POA: Diagnosis not present

## 2019-08-27 DIAGNOSIS — Z9889 Other specified postprocedural states: Secondary | ICD-10-CM

## 2019-08-27 DIAGNOSIS — R29898 Other symptoms and signs involving the musculoskeletal system: Secondary | ICD-10-CM | POA: Diagnosis not present

## 2019-08-27 NOTE — Therapy (Signed)
[place], [place], [address] Phone: [phone]   Fax:  [phone]  Physical Therapy Treatment  Patient Details  Name: [name] MRN: [id number] Date of Birth: 02-28-1965 Referring Provider (PT): Dr.  Thornton Park   Encounter Date: 08/27/2019   PT End of Session - 08/27/19 0719    Visit Number 10    Number of Visits 16    Date for PT Re-Evaluation 09/13/19    Authorization Type U[employer] MCR and MCD    PT Start Time 0715    PT Stop Time 0755    PT Time Calculation (min) 40 min           Past Medical History:  Diagnosis Date  . Alcoholism ([place])   . Anxiety   . Arthritis 04-10-11   hips, shoulders, back  . Asthma   . Bipolar disorder ([place])   . Cardiomegaly   . Carpal tunnel syndrome, bilateral   . Cervical cancer ([place]) 1993   cervical, no treatment done, went away per pt  . Cervical dysplasia or atypia 04-10-11   '93- once dx.-got pregnant-no intervention, then postpartum, no dysplasia found  . CHF (congestive heart failure) ([place])    no cardiologist 2014 dx, none now  . Chronic hypoxemic respiratory failure ([place])   . Chronic kidney disease    stage 3 kidney disease  . 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 Pathology showed Condyloma, gluteal CONDYLOMA ACUMINATUM.   [name] Kitchen COPD (chronic obstructive pulmonary disease) ([place])   . Depression   . Diabetes mellitus without complication ([place])   . Dyspnea    with actity, sitting  . Fall 12/16/2016  . Fibrocystic breast disease   . GERD (gastroesophageal reflux disease)   . Grade I diastolic dysfunction 67/12/4578   Noted on ECHO  . Hepatitis    hep B-count is low at present,doesn't register  . History of PSVT (paroxysmal supraventricular tachycardia)   . Hypercalcemia   . Hyperlipidemia   . Hypertension   . Incomplete left bundle branch block (LBBB) 09/24/2018   Noted on EKG  . Lung nodule 11/2017   right and  left lung  . Migraine headache    none recent  . Morbid obesity ([place]) 03/27/2006  . Neuropathy   . Pneumonia    walking pneu 15 yrs. ago  . Skin lesion 03/15/2011   In gluteal crease now s/p removal by Dr. [name] of General Surgery on 3/12. Path shows condyloma.     . Sleep apnea 04-10-11   uses cpap, pt does not know settings  . Trigger finger    left third  . Urge incontinence of urine     Past Surgical History:  Procedure Laterality Date  . ANAL FISTULECTOMY  04/17/2011   Procedure: FISTULECTOMY ANAL;  Surgeon: Imogene Burn. [name], MD;  Location: [facility];  Service: General;  Laterality: N/A;  Excision of Condyloma Gluteal Cleft   . ANAL RECTAL MANOMETRY N/A 03/26/2019   Procedure: ANO RECTAL MANOMETRY;  Surgeon: Thornton Park, MD;  Location: [facility];  Service: Gastroenterology;  Laterality: N/A;  . BIOPSY  03/09/2019   Procedure: BIOPSY;  Surgeon: Thornton Park, MD;  [facility];  Service: Gastroenterology;;  . Lum Keas INJECTION N/A 12/29/2018   Procedure: BOTOX INJECTION(100 UNITS)  WITH CYSTOSCOPY;  Surgeon: Ceasar Mons, MD;  Location: [facility];  Service: Urology;  Laterality: N/A;  . BOTOX INJECTION N/A 07/27/2019   Procedure: BOTOX INJECTION WITH  CYSTOSCOPY;  Surgeon: Ceasar Mons, MD;  Location: [facility];  Service: Urology;  Laterality: N/A;  ONLY NEEDS 30 MIN  . BREAST CYST EXCISION     bilateral breast, 3 cysts removed from each breast  . BREAST EXCISIONAL BIOPSY Right    x 3  . BREAST EXCISIONAL BIOPSY Left    x 3  . CARPAL TUNNEL RELEASE Bilateral   . Cervical biospy    . CERVICAL FUSION  04-10-11   x3- cervical fusion with plating and screws-Dr. [name]  . COLONOSCOPY WITH PROPOFOL N/A 07/06/2015   Procedure: COLONOSCOPY WITH PROPOFOL;  Surgeon: [name], MD;  [facility];  Service: Endoscopy;  Laterality: N/A;  . COLONOSCOPY WITH PROPOFOL N/A 03/09/2019   Procedure: COLONOSCOPY WITH PROPOFOL;  Surgeon: Thornton Park, MD;   [facility];  Service: Gastroenterology;  Laterality: N/A;  . DOPPLER ECHOCARDIOGRAPHY  04/06/2012   AT [place] 55-60%  . FOOT SURGERY Left 2018  . FRACTURE SURGERY     little toe left foot  . I & D EXTREMITY Left 12/18/2016   Procedure: IRRIGATION AND DEBRIDEMENT LEFT FOOT, CLOSURE;  Surgeon: [name], MD;  Location: [facility];  Service: Orthopedics;  Laterality: Left;  . University SURGERY  04-10-11   x5-Lumbar fusion-retained hardware.(Dr. Louanne Skye)  . MULTIPLE TOOTH EXTRACTIONS    . POLYPECTOMY  03/09/2019   Procedure: POLYPECTOMY;  Surgeon: Thornton Park, MD;  [facility];  Service: Gastroenterology;;  . SHOULDER ARTHROSCOPY WITH ROTATOR CUFF REPAIR AND SUBACROMIAL DECOMPRESSION Left 05/07/2019   Procedure: LEFT SHOULDER ARTHROSCOPY WITH DEBRIDEMENT, DISTAL CLAVICLE EXCISION,  SUBACROMIAL DECOMPRESSION AND POSSIBLE ROTATOR CUFF REPAIR;  Surgeon: [name], MD;  Location: [facility];  Service: Orthopedics;  Laterality: Left;  . TEE WITHOUT CARDIOVERSION N/A 04/06/2012   Procedure: TRANSESOPHAGEAL ECHOCARDIOGRAM (TEE);  Surgeon: [name], MD;  Location: [facility];  Service: Cardiovascular;  Laterality: N/A;  . TRIGGER FINGER RELEASE Right    middle finger  . TRIGGER FINGER RELEASE Left 06/28/2016   Procedure: RELEASE TRIGGER FINGER LEFT 3RD FINGER;  Surgeon: [name], MD;  Location: [facility];  Service: Orthopedics;  Laterality: Left;  . TRIGGER FINGER RELEASE Right 06/12/2016   Procedure: RIGHT INDEX FINGER TRIGGER RELEASE;  Surgeon: [name], MD;  Location: [facility];  Service: Orthopedics;  Laterality: Right;  . TRIGGER FINGER RELEASE Right 01/19/2018   Procedure: RIGHT RING FINGER TRIGGER FINGER RELEASE;  Surgeon: [name], MD;  Location: [facility];  Service: Orthopedics;  Laterality: Right;  . TRIGGER FINGER RELEASE Left 05/07/2019   Procedure: RELEASE TRIGGER FINGER LEFT INDEX FINGER;  Surgeon: [name], MD;  Location: [facility];  Service:  Orthopedics;  Laterality: Left;    There were no vitals filed for this visit.   Subjective Assessment - 08/27/19 0716    Subjective I was having severe pain I hink because I decided to sleep at the top of the bed instead of the bottom.Better now that I changed back around.    Currently in Pain? Yes    Pain Score 9     Pain Location Shoulder    Pain Orientation Left    Pain Descriptors / Indicators Aching;Throbbing    Pain Type Chronic pain              [facility] PT Assessment - 08/27/19 0001      AROM   Overall AROM Comments still has AROM WFL, pain with reaching back  AROM Assessment Site Shoulder    Right/Left Shoulder Left    Left Shoulder Flexion 145 Degrees    Left Shoulder Internal Rotation --   FR to L1   Left Shoulder External Rotation --   FR to T2                         [facility] Adult PT Treatment/Exercise - 08/27/19 0001      Shoulder Exercises: Seated   Extension --    Theraband Level (Shoulder Extension) --    Row --    Theraband Level (Shoulder Row) --    Row Limitations single arm red 2 x 20     Horizontal ABduction Strengthening;Both;20 reps;Theraband    Theraband Level (Shoulder Horizontal ABduction) Level 2 (Red)    External Rotation Limitations red band x 20    2 sets   Theraband Level (Shoulder Internal Rotation) Level 2 (Red)    Internal Rotation Limitations red band x 20    2 sets   Flexion Limitations 2# curl to overhead press     Other Seated Exercises cabinet reaching from seated 1# x10 , 2# x 10     used elevated tray table   Other Seated Exercises tricep pull down green x 20      Shoulder Exercises: Standing   Extension Strengthening;Both;20 reps;Theraband    Theraband Level (Shoulder Extension) Level 3 (Green)    Row Strengthening;Both;20 reps    Theraband Level (Shoulder Row) Level 3 (Green)    Other Standing Exercises 1# standing scaption x 15 limited by back and hip pain       Shoulder Exercises: Pulleys   Flexion 3  minutes      Manual Therapy   Manual therapy comments STW to left bicep and deltoid                    PT Short Term Goals - 07/29/19 1106      PT SHORT TERM GOAL #1   Title Pt will be I with HEP for strength and ROM within limits of surgical protocol    Time 4    Period Weeks    Status Achieved    Target Date 06/21/19      PT SHORT TERM GOAL #2   Title Pt will be able to improve transfers in the clinic with mod I (stand-pivot and bed mobility)    Baseline independent    Time 4    Period Weeks    Status Achieved      PT SHORT TERM GOAL #3   Title Pt will be able to use L UE to assist with ADLs below shoulder height with min increase in pain    Time 4    Period Weeks    Status Achieved    Target Date 06/21/19      PT SHORT TERM GOAL #4   Title deferred    Baseline --    Time --    Period --    Status --             PT Long Term Goals - 08/12/19 1218      Additional Long Term Goals   Additional Long Term Goals Yes      PT LONG TERM GOAL #6   Title reduce the discomfort in the right levator ani to minimal to none    Time 12    Period Weeks    Status New  Target Date 11/04/19      PT LONG TERM GOAL #7   Title able to fully relax to have a bowel movement with no discomfort    Time 12    Period Weeks    Status New    Target Date 11/04/19      PT LONG TERM GOAL #8   Title independent with discomfort management techniques and pelvic floor control    Time 12    Period Weeks    Status New    Target Date 11/04/19                 Plan - 08/27/19 0720    Clinical Impression Statement Pt arrives after canceling her last visit due to increased back and shoulder pain. She attributes it to changing her position in bed. Her ROM mis WFL however she has constant 9/10 pain with increased pain during reaching back into shoulder extension. She tolerated all strengthening exercises and was limited in standing tolerance due to back pain. Manual soft  tissue work to left bicep/deltoid to reduce spasms at end of session. Tissue softened.    PT Next Visit Plan Needs progress note added by PT to one of her 1st 10 visits. L shoulder: pain control, AROM, strengthen as tolerated.  pelvic floor: EMG for pelvic floor contraction, how long she can contract, control of contraction           Patient will benefit from skilled therapeutic intervention in order to improve the following deficits and impairments:  Abnormal gait, Decreased range of motion, Difficulty walking, Obesity, Impaired UE functional use, Decreased endurance, Cardiopulmonary status limiting activity, Decreased activity tolerance, Pain, Impaired flexibility, Decreased balance, Decreased strength, Decreased mobility, Postural dysfunction, Increased muscle spasms  Visit Diagnosis: Muscle weakness (generalized)  Acute pain of left shoulder  S/P left rotator cuff repair     Problem List Patient Active Problem List   Diagnosis Date Noted  . Ulnar nerve compression 08/06/2019  . Diabetic neuropathy associated with type 2 diabetes mellitus ([place]) 08/06/2019  . Body mass index 50.0-59.9, adult ([place]) 06/12/2019  . Impingement syndrome of left shoulder 05/07/2019  . Polyp of colon   . Polyp of ascending colon   . Rectal discomfort 02/12/2019  . Osteoarthritis of right hip 02/12/2019  . Nontraumatic tear of left supraspinatus tendon 12/30/2018  . Gout 12/17/2018  . Nontraumatic complete tear of right rotator cuff 12/08/2018  . Rotator cuff tear, right 12/01/2018  . Right groin pain 07/20/2018  . Depressed mood 07/13/2018  . Paroxysmal SVT (supraventricular tachycardia) ([place]) 03/20/2018  . Pure hypercholesterolemia 03/20/2018  . Muscle cramping 02/25/2018  . Nodule of upper lobe of right lung 02/13/2018  . Falls, subsequent encounter 01/30/2018  . Pain in left foot 11/04/2017  . Essential hypertension 10/27/2017  . Solitary pulmonary nodule 10/07/2017  . Restrictive lung  disease secondary to obesity 10/01/2017  . Polycythemia, secondary 10/01/2017  . Chronic viral hepatitis B without delta-agent ([place]) 07/08/2017  . COPD (chronic obstructive pulmonary disease) with chronic bronchitis ([place]) 06/27/2017  . Chronic kidney disease (CKD), stage IV (severe) ([place]) 01/14/2017  . Type 2 diabetes mellitus with stage 3 chronic kidney disease, with long-term current use of insulin ([place]) 12/12/2016  . Urge incontinence of urine 12/05/2016  . Trigger finger, left index finger 07/15/2016  . Back pain 04/07/2014  . Morbid obesity ([place]) 10/02/2012  . Chronic diastolic CHF (congestive heart failure) ([place]) 04/07/2012  . GERD 01/26/2010  . Obstructive sleep apnea treated with  BiPAP 02/03/2008  . FIBROCYSTIC BREAST DISEASE 10/28/2006  . Hyperlipidemia 03/27/2006  . Obesity hypoventilation syndrome ([place]) 03/27/2006  . Former tobacco use 03/27/2006  . Depression with anxiety 03/27/2006    [name], PTA 08/27/2019, 8:03 AM  [facility] [address] [place], [place], [address] Phone: [phone]   Fax:  [phone]  Name: [name] MRN: [id number] Date of Birth: [birth year]

## 2019-08-30 ENCOUNTER — Other Ambulatory Visit: Payer: Self-pay

## 2019-08-30 ENCOUNTER — Ambulatory Visit (INDEPENDENT_AMBULATORY_CARE_PROVIDER_SITE_OTHER): Payer: Medicare Other | Admitting: Psychology

## 2019-08-30 DIAGNOSIS — F320 Major depressive disorder, single episode, mild: Secondary | ICD-10-CM

## 2019-08-30 NOTE — BH Specialist Note (Signed)
Integrated Behavioral Health Visit   06/30/4467 [name] [id number]   Session Start time: 9  Session End time: 62 Total time: 30  Referring Provider: Dr. Caron Presume  Confidentiality has been reviewed with patient.  Patient verbalized consent for a integrated care visit.   PRESENTING CONCERNS: Patient and/or family reports the following symptoms/concerns: Pt reported she has been experiencing chronic physical pain in her back.  Pt shared she is able to not allow it to impact her emotions because of the perspective she has gained that engaging in those emotions will not be helpful for her.    Pt shared her past trauma and how they impacted her substance use.  She reported she has been 4 years sober and reported her journey has been about forgiveness and leaning on faith.    Pt was asked to give consent to gastric bypass doctor to speak with Dr.  Hartford Poli about letter needs.  Duration of problem: past year; Severity of problem: mild  STRENGTHS (Protective Factors/Coping Skills): Insight and previous experience of overcoming hard times   GOALS ADDRESSED: Patient will: 1.  Reduce symptoms of: depression: pt syx are improved since last appt; pt continues to fight depressed emotions 2.  Increase knowledge and/or ability of: self-management skills: pt engaged and motivated to challenge negative thoughts and reframed; cont to reinforce   3.  Demonstrate ability to: Increase healthy adjustment to current life circumstances  INTERVENTIONS: Interventions utilized:  Brief CBT Standardized Assessments completed: PHQ9  ASSESSMENT: Patient currently experiencing depressed mood related to stressful life circumstances .   Patient may benefit from supportive therapy and CBT coping strategies  PLAN: 1. Follow up with behavioral health clinician on : challenging her thoughts  2. Behavioral recommendations: continue therapy  3. Referral(s): [place] (In  Clinic)  Erlinda Hong, PhD., LMFT-A

## 2019-08-31 ENCOUNTER — Ambulatory Visit: Payer: Medicare Other | Admitting: Physical Therapy

## 2019-08-31 ENCOUNTER — Encounter: Payer: Self-pay | Admitting: Physical Therapy

## 2019-09-01 ENCOUNTER — Encounter: Payer: Self-pay | Admitting: Family Medicine

## 2019-09-02 ENCOUNTER — Encounter: Payer: Self-pay | Admitting: Physical Therapy

## 2019-09-03 ENCOUNTER — Ambulatory Visit: Payer: Medicare Other | Admitting: Physical Therapy

## 2019-09-04 DIAGNOSIS — M5090 Cervical disc disorder, unspecified, unspecified cervical region: Secondary | ICD-10-CM | POA: Diagnosis not present

## 2019-09-04 DIAGNOSIS — G4733 Obstructive sleep apnea (adult) (pediatric): Secondary | ICD-10-CM | POA: Diagnosis not present

## 2019-09-04 DIAGNOSIS — J9621 Acute and chronic respiratory failure with hypoxia: Secondary | ICD-10-CM | POA: Diagnosis not present

## 2019-09-06 ENCOUNTER — Other Ambulatory Visit: Payer: Self-pay | Admitting: Family Medicine

## 2019-09-07 ENCOUNTER — Encounter: Payer: Self-pay | Admitting: Physical Therapy

## 2019-09-07 ENCOUNTER — Ambulatory Visit: Payer: Medicare Other | Admitting: Physical Therapy

## 2019-09-09 ENCOUNTER — Ambulatory Visit (INDEPENDENT_AMBULATORY_CARE_PROVIDER_SITE_OTHER): Payer: Medicare Other | Admitting: Family Medicine

## 2019-09-09 ENCOUNTER — Other Ambulatory Visit: Payer: Self-pay

## 2019-09-09 VITALS — BP 118/62 | HR 85 | Ht 69.0 in | Wt 358.8 lb

## 2019-09-09 DIAGNOSIS — L97121 Non-pressure chronic ulcer of left thigh limited to breakdown of skin: Secondary | ICD-10-CM | POA: Diagnosis not present

## 2019-09-09 MED ORDER — MUPIROCIN 2 % EX OINT: 1.0000 "application " | TOPICAL_OINTMENT | Freq: Every day | CUTANEOUS | 0 refills | Status: DC

## 2019-09-09 NOTE — Assessment & Plan Note (Signed)
Does not appear to be infected. Patient has been using ketoconazole cream, advised that this is for fungus. Patient will discontinue the use. Discussed with Dr. Erin Hearing. Agrees that given patient's history of diabetes will approach conservatively, will treat with mupirocin ointment as is preferred over Bactroban for her insurance, will use once daily, otherwise we'll have patient use Vaseline, clean area with warm water and soap at least twice daily, keep covered. She has an appointment next week with her PCP, can follow-up at that time. Advised patient to return to care if any fevers, chills, increased pain, increased discharge.

## 2019-09-09 NOTE — Progress Notes (Signed)
    SUBJECTIVE:   CHIEF COMPLAINT / HPI:   Left Leg Wound 7 weeks Has been using ketoconazole on it Wound is burning and then a little pain when she sits or something touches it No fevers or chills Has been draining a little blood lately Didn't notice that it started with a bump First noticed it one day when she sat down and felt a burning pain Has never been able to see it because it is on the back of her thigh, near her buttocks   PERTINENT  PMH / PSH: HTN, CHF, SVT, OHS, COPD, T2DM, Diabetic neuropathy, CKD, HLD, Depression, Morbid Obesity  OBJECTIVE:   BP 118/62   Pulse 85   Ht 5\' 9"  (1.753 m)   Wt (!) 358 lb 12.8 oz (162.8 kg)   LMP 04/10/2006   SpO2 93%   BMI 52.99 kg/m    Physical Exam:  General: 54 y.o. female in NAD Lungs: Breathing comfortably on room air Skin: warm and dry, 7-8 mm round ulceration on left medial thigh about 2cm below gluteal fold, no surrounding erythema or discharge, clear margins, see image below, no TTP       ASSESSMENT/PLAN:   Skin ulcer of left thigh, limited to breakdown of skin ([place]) Does not appear to be infected. Patient has been using ketoconazole cream, advised that this is for fungus. Patient will discontinue the use. Discussed with Dr. Erin Hearing. Agrees that given patient's history of diabetes will approach conservatively, will treat with mupirocin ointment as is preferred over Bactroban for her insurance, will use once daily, otherwise we'll have patient use Vaseline, clean area with warm water and soap at least twice daily, keep covered. She has an appointment next week with her PCP, can follow-up at that time. Advised patient to return to care if any fevers, chills, increased pain, increased discharge.     Cleophas Dunker, [place]

## 2019-09-09 NOTE — Patient Instructions (Signed)
Thank you for coming to see me today. It was a pleasure. Today we talked about:   I have sent an antibiotic cream to the pharmacy for your wound. Place this on the area once a day. Put a dressing over the area, keep it clean, wash it daily with soap and warm water at least twice a day. Use Vaseline on the area as well, when you do not have antibiotic cream on it. Try a schedule like putting antibiotic cream on it at night and Vaseline on it in the morning. If you have worsening in pain, discharge, fever you should be seen right away.  Please follow-up with Dr. Caron Presume next week.  If you have any questions or concerns, please do not hesitate to call the office at [phone].  Best,   [place] Constable, DO

## 2019-09-10 ENCOUNTER — Ambulatory Visit: Payer: Medicare Other | Admitting: Physical Therapy

## 2019-09-12 DIAGNOSIS — G4733 Obstructive sleep apnea (adult) (pediatric): Secondary | ICD-10-CM | POA: Diagnosis not present

## 2019-09-12 DIAGNOSIS — M5090 Cervical disc disorder, unspecified, unspecified cervical region: Secondary | ICD-10-CM | POA: Diagnosis not present

## 2019-09-13 ENCOUNTER — Ambulatory Visit (INDEPENDENT_AMBULATORY_CARE_PROVIDER_SITE_OTHER): Payer: Medicare Other | Admitting: Psychology

## 2019-09-13 ENCOUNTER — Other Ambulatory Visit: Payer: Self-pay

## 2019-09-13 DIAGNOSIS — F320 Major depressive disorder, single episode, mild: Secondary | ICD-10-CM

## 2019-09-13 NOTE — BH Specialist Note (Signed)
  Integrated Behavioral Health Visit   2/99/3716 SEYMONE FORLENZA [id number]   Session Start time: 9  Session End time: 930 Total time: 30  Referring Provider: Dr. Caron Presume  Confidentiality has been reviewed with patient.  Patient verbalized consent for a integrated care visit.   PRESENTING CONCERNS: Patient and/or family reports the following symptoms/concerns: Pt reported she was feeling well.  Pt shared she has been able to maintain her positive mindset even with set backs to her weight.  Pt and Dr. Hartford Poli agreed that pt is in good space to terminate therapy.   Duration of problem:past year; Severity of problem:mild  STRENGTHS (Protective Factors/Coping Skills): Insight and previous experience of overcoming hard times  GOALS ADDRESSED: Patient will: 1. Reduce symptoms of: depression: pt syx are improved since last appt; pt continues to fight depressed emotions; PHQ9- score of 0 2. Increase knowledge and/or ability of: self-management skills: pt engaged and motivated to challenge negative thoughts and reframed 3. Demonstrate ability to: Increase healthy adjustment to current life circumstances  INTERVENTIONS: Interventions utilized:Brief CBT Standardized Assessments completed:PHQ9  ASSESSMENT: Patient currently experiencingdepressed mood related to stressful life circumstances. Pt has since been improved with PHQ9 of 0.   Patient may benefit fromsupportive therapy and CBT coping strategies refresher in future.  PLAN: 1. Follow up with behavioral health clinician on :termination of therapy 2. Behavioral recommendations:no f/u needed.  To call if f/u if needed    Erlinda Hong, PhD., LMFT-A

## 2019-09-14 ENCOUNTER — Ambulatory Visit: Payer: Medicare Other | Admitting: Family Medicine

## 2019-09-14 ENCOUNTER — Other Ambulatory Visit: Payer: Self-pay | Admitting: Family Medicine

## 2019-09-14 ENCOUNTER — Ambulatory Visit: Payer: Medicare Other | Admitting: Gastroenterology

## 2019-09-16 ENCOUNTER — Encounter: Payer: Medicare Other | Attending: General Surgery | Admitting: Skilled Nursing Facility1

## 2019-09-16 ENCOUNTER — Other Ambulatory Visit: Payer: Self-pay

## 2019-09-16 ENCOUNTER — Encounter: Payer: Self-pay | Admitting: Skilled Nursing Facility1

## 2019-09-16 ENCOUNTER — Other Ambulatory Visit: Payer: Self-pay | Admitting: Podiatry

## 2019-09-16 ENCOUNTER — Other Ambulatory Visit: Payer: Self-pay | Admitting: Family Medicine

## 2019-09-16 DIAGNOSIS — E669 Obesity, unspecified: Secondary | ICD-10-CM

## 2019-09-16 DIAGNOSIS — B353 Tinea pedis: Secondary | ICD-10-CM

## 2019-09-16 DIAGNOSIS — Z713 Dietary counseling and surveillance: Secondary | ICD-10-CM | POA: Diagnosis not present

## 2019-09-16 DIAGNOSIS — E1151 Type 2 diabetes mellitus with diabetic peripheral angiopathy without gangrene: Secondary | ICD-10-CM

## 2019-09-16 NOTE — Telephone Encounter (Signed)
Last office visit:07/20/19,refill this request?

## 2019-09-16 NOTE — Progress Notes (Signed)
Nutrition Assessment for Bariatric Surgery Medical Nutrition Therapy  Patient was seen on 09/16/2019 for Pre-Operative Nutrition Assessment. Letter of approval faxed to [facility] bariatric surgery program coordinator on 09/16/2019  Referral stated Supervised Weight Loss (SWL) visits needed: 0  Planned surgery: Sleeve Gastrectomy  Pt expectation of surgery: to lose weight Pt expectation of dietitian: to guide    NUTRITION ASSESSMENT   Anthropometrics  Start weight at [facility]: 405.6 lbs (date: 01/15/2018)  Wt today: 357 pounds Height: 68.5 in BMI: 53.49 kg/m2     Clinical  Medical hx: CHF, OSA, COPD, GERD, Diabetes, CKD, hyperlipidemia, GOUT Medications: fast and long acting insulin  Labs: WBC 11.4, RBC 6.05, Hemoglobin 16.6, HCT 49,  K 3.2, BUN 32, Creatinine 1.48, GFR 46 Notable signs/symptoms:  Any previous deficiencies? No  Micronutrient Nutrition Focused Physical Exam: Hair: No issues observed Eyes: No issues observed Mouth: No issues observed Neck: No issues observed Nails: No issues observed Skin: No issues observed  Lifestyle & Dietary Hx  Pt states she was able to lose 50 pounds in the last year due to cutting down portions and remembering her time with the dietitian.  Pt states she checks her blood sugars 3 times a day: in the 200's stating she knows it is from eating too much fruit.  Pt states she tells herself not to eat past 12am but has been having trouble with that stating she will fall asleep at 9pm and then wake at 12am and eat stating she is hungry: Dietitian advised pt that is not hunger.  Pt states she has not had reflux since making dietary changes (has been taking omeprazole as well).  24-Hr Dietary Recall First Meal: fruit Snack: fruit Second Meal: fish + caulfilower + french fries or turley sandwich Snack: fruit Third Meal:  fish + caulfilower + french fries  Snack: fruit Beverages: slim fast, powerade zero, diet gingerale, water    Estimated Energy Needs Calories: 1600   NUTRITION DIAGNOSIS  Overweight/obesity (Max-3.3) related to past poor dietary habits and physical inactivity as evidenced by patient w/ planned Sleeve Gastrectomy surgery following dietary guidelines for continued weight loss.    NUTRITION INTERVENTION  Nutrition counseling (C-1) and education (E-2) to facilitate bariatric surgery goals.   Pre-Op Goals Reviewed with the Patient . Track food and beverage intake (pen and paper, MyFitness Pal, Baritastic app, etc.) . Make healthy food choices while monitoring portion sizes . Consume 3 meals per day or try to eat every 3-5 hours . Avoid concentrated sugars and fried foods . Keep sugar & fat in the single digits per serving on food labels . Practice CHEWING your food (aim for applesauce consistency) . Practice not drinking 15 minutes before, during, and 30 minutes after each meal and snack . Avoid all carbonated beverages (ex: soda, sparkling beverages)  . Limit caffeinated beverages (ex: coffee, tea, energy drinks) . Avoid all sugar-sweetened beverages (ex: regular soda, sports drinks)  . Avoid alcohol  . Aim for 64-100 ounces of FLUID daily (with at least half of fluid intake being plain water)  . Aim for at least 60-80 grams of PROTEIN daily . Look for a liquid protein source that contains ?15 g protein and ?5 g carbohydrate (ex: shakes, drinks, shots) . Make a list of non-food related activities . Physical activity is an important part of a healthy lifestyle so keep it moving! The goal is to reach 150 minutes of exercise per week, including cardiovascular and weight baring activity. -Do not eat after  7pm what you feel waking in the middle of the night is NOT hunger -Choose a protein shake that has 15 grams or more of protein and 5 grams or less of carbohydrate such as premier protein  -Limit fruit to 3 servings (1/2 a cup or 1 whole fruit) a day -Do not drink Gingerale or sparkling  water  *Goals that are bolded indicate the pt would like to start working towards these  Handouts Provided Include  . Bariatric Surgery handouts (Nutrition Visits, Pre-Op Goals, Protein Shakes, Vitamins & Minerals)  Learning Style & Readiness for Change Teaching method utilized: Visual & Auditory  Demonstrated degree of understanding via: Teach Back  Barriers to learning/adherence to lifestyle change: none identified      MONITORING & EVALUATION Dietary intake, weekly physical activity, body weight, and pre-op goals reached at next nutrition visit.    Next Steps  Patient is to follow up at [facility] for Pre-Op Class >2 weeks before surgery for further nutrition education.

## 2019-09-21 ENCOUNTER — Encounter: Payer: Self-pay | Admitting: Cardiology

## 2019-09-22 ENCOUNTER — Other Ambulatory Visit: Payer: Self-pay

## 2019-09-22 ENCOUNTER — Ambulatory Visit: Payer: Medicare Other | Attending: Orthopaedic Surgery | Admitting: Physical Therapy

## 2019-09-22 DIAGNOSIS — R278 Other lack of coordination: Secondary | ICD-10-CM | POA: Diagnosis not present

## 2019-09-22 DIAGNOSIS — K6289 Other specified diseases of anus and rectum: Secondary | ICD-10-CM | POA: Diagnosis not present

## 2019-09-22 DIAGNOSIS — M6281 Muscle weakness (generalized): Secondary | ICD-10-CM

## 2019-09-22 NOTE — Patient Instructions (Signed)
Access Code: [email or web address] URL: [email or web address] Date: 09/22/2019 Prepared by: Earlie Counts  Exercises Sidelying Diaphragmatic Breathing - 4 x daily - 7 x weekly - 1 sets [facility] [address], [place], [place] [address] Phone # [phone] Fax [phone]

## 2019-09-22 NOTE — Therapy (Addendum)
[facility] [address], [place], [place], [address] Phone: [phone]   Fax:  [phone]  Physical Therapy Treatment  Patient Details  Name: [name] MRN: [id number] Date of Birth: 1965-10-12 Referring Provider (PT): Dr.  Thornton Park   Encounter Date: 09/22/2019   PT End of Session - 09/22/19 0958    Visit Number 11    Date for PT Re-Evaluation 11/04/19   pelvic floor   Authorization Type UHC MCR and MCD    PT Start Time 0930    PT Stop Time 1010    PT Time Calculation (min) 40 min    Activity Tolerance Patient tolerated treatment well;No increased pain    Behavior During Therapy WFL for tasks assessed/performed           Past Medical History:  Diagnosis Date  . Alcoholism ([place])   . Anxiety   . Arthritis 04-10-11   hips, shoulders, back  . Asthma   . Bipolar disorder ([place])   . Cardiomegaly   . Carpal tunnel syndrome, bilateral   . Cervical cancer ([place]) 1993   cervical, no treatment done, went away per pt  . Cervical dysplasia or atypia 04-10-11   '93- once dx.-got pregnant-no intervention, then postpartum, no dysplasia found  . CHF (congestive heart failure) ([place])    no cardiologist 2014 dx, none now  . Chronic hypoxemic respiratory failure ([place])   . Chronic kidney disease    stage 3 kidney disease  . 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 Pathology showed Condyloma, gluteal CONDYLOMA ACUMINATUM.   [name] Kitchen COPD (chronic obstructive pulmonary disease) ([place])   . Depression   . Diabetes mellitus without complication ([place])   . Dyspnea    with actity, sitting  . Fall 12/16/2016  . Fibrocystic breast disease   . GERD (gastroesophageal reflux disease)   . Grade I diastolic dysfunction 63/84/5364   Noted on ECHO  . Hepatitis    hep B-count is low at present,doesn't register  . History of PSVT (paroxysmal supraventricular tachycardia)   . Hypercalcemia   .  Hyperlipidemia   . Hypertension   . Incomplete left bundle branch block (LBBB) 09/24/2018   Noted on EKG  . Lung nodule 11/2017   right and left lung  . Migraine headache    none recent  . Morbid obesity ([place]) 03/27/2006  . Neuropathy   . Pneumonia    walking pneu 15 yrs. ago  . Skin lesion 03/15/2011   In gluteal crease now s/p removal by Dr. [name] of General Surgery on 3/12. Path shows condyloma.     . Sleep apnea 04-10-11   uses cpap, pt does not know settings  . Trigger finger    left third  . Urge incontinence of urine     Past Surgical History:  Procedure Laterality Date  . ANAL FISTULECTOMY  04/17/2011   Procedure: FISTULECTOMY ANAL;  Surgeon: Imogene Burn. [name], MD;  Location: [facility];  Service: General;  Laterality: N/A;  Excision of Condyloma Gluteal Cleft   . ANAL RECTAL MANOMETRY N/A 03/26/2019   Procedure: ANO RECTAL MANOMETRY;  Surgeon: Thornton Park, MD;  Location: [facility];  Service: Gastroenterology;  Laterality: N/A;  . BIOPSY  03/09/2019   Procedure: BIOPSY;  Surgeon: Thornton Park, MD;  [facility];  Service: Gastroenterology;;  . Lum [place] INJECTION N/A 12/29/2018   Procedure: BOTOX INJECTION(100 UNITS)  WITH CYSTOSCOPY;  Surgeon: Ceasar Mons, MD;  [facility];  Service: Urology;  Laterality: N/A;  . BOTOX INJECTION N/A 07/27/2019   Procedure: BOTOX INJECTION WITH CYSTOSCOPY;  Surgeon: Ceasar Mons, MD;  [facility]: [facility];  Service: Urology;  Laterality: N/A;  ONLY NEEDS 30 MIN  . BREAST CYST EXCISION     bilateral breast, 3 cysts removed from each breast  . BREAST EXCISIONAL BIOPSY Right    x 3  . BREAST EXCISIONAL BIOPSY Left    x 3  . CARPAL TUNNEL RELEASE Bilateral   . Cervical biospy    . CERVICAL FUSION  04-10-11   x3- cervical fusion with plating and screws-Dr. [name]  . COLONOSCOPY WITH PROPOFOL N/A 07/06/2015   Procedure: COLONOSCOPY WITH PROPOFOL;  Surgeon: [name], MD;  [facility];  Service:  Endoscopy;  Laterality: N/A;  . COLONOSCOPY WITH PROPOFOL N/A 03/09/2019   Procedure: COLONOSCOPY WITH PROPOFOL;  Surgeon: Thornton Park, MD;  [facility];  Service: Gastroenterology;  Laterality: N/A;  . DOPPLER ECHOCARDIOGRAPHY  04/06/2012   AT [place] 55-60%  . FOOT SURGERY Left 2018  . FRACTURE SURGERY     little toe left foot  . I & D EXTREMITY Left 12/18/2016   Procedure: IRRIGATION AND DEBRIDEMENT LEFT FOOT, CLOSURE;  Surgeon: [name], MD;  Location: [facility];  Service: Orthopedics;  Laterality: Left;  . [place] SURGERY  04-10-11   x5-Lumbar fusion-retained hardware.(Dr. Louanne Skye)  . MULTIPLE TOOTH EXTRACTIONS    . POLYPECTOMY  03/09/2019   Procedure: POLYPECTOMY;  Surgeon: Thornton Park, MD;  [facility];  Service: Gastroenterology;;  . SHOULDER ARTHROSCOPY WITH ROTATOR CUFF REPAIR AND SUBACROMIAL DECOMPRESSION Left 05/07/2019   Procedure: LEFT SHOULDER ARTHROSCOPY WITH DEBRIDEMENT, DISTAL CLAVICLE EXCISION,  SUBACROMIAL DECOMPRESSION AND POSSIBLE ROTATOR CUFF REPAIR;  Surgeon: [name], MD;  Location: [facility];  Service: Orthopedics;  Laterality: Left;  . TEE WITHOUT CARDIOVERSION N/A 04/06/2012   Procedure: TRANSESOPHAGEAL ECHOCARDIOGRAM (TEE);  Surgeon: [name], MD;  Location: [facility];  Service: Cardiovascular;  Laterality: N/A;  . TRIGGER FINGER RELEASE Right    middle finger  . TRIGGER FINGER RELEASE Left 06/28/2016   Procedure: RELEASE TRIGGER FINGER LEFT 3RD FINGER;  Surgeon: [name], MD;  Location: [facility];  Service: Orthopedics;  Laterality: Left;  . TRIGGER FINGER RELEASE Right 06/12/2016   Procedure: RIGHT INDEX FINGER TRIGGER RELEASE;  Surgeon: [name], MD;  Location: [facility];  Service: Orthopedics;  Laterality: Right;  . TRIGGER FINGER RELEASE Right 01/19/2018   Procedure: RIGHT RING FINGER TRIGGER FINGER RELEASE;  Surgeon: [name], MD;  Location: [facility];  Service: Orthopedics;  Laterality: Right;  . TRIGGER  FINGER RELEASE Left 05/07/2019   Procedure: RELEASE TRIGGER FINGER LEFT INDEX FINGER;  Surgeon: [name], MD;  Location: [facility];  Service: Orthopedics;  Laterality: Left;    There were no vitals filed for this visit.   Subjective Assessment - 09/22/19 0939    Subjective I felt good after the last visit. I did not have a tennis ball so tried a smaller ball and did not help.    Pertinent History lengthy, see snapshot.  Kidney disease, L SAD, DCR and supraspinatus repair 05/07/19, 2 L 02 at all times    Limitations House hold activities;Lifting;Standing;Walking    Patient Stated Goals have the anal discomfort go away    Currently in Pain? No/denies  Pelvic Floor Special Questions - 09/22/19 0001    Biofeedback resting tone at 12 uv then doing diaphragmatic breathing to relax the pelvic floor went down to 4 uv and took 25 min;    left sidely   Biofeedback sensor type Surface   rectal            [facility] Adult PT Treatment/Exercise - 09/22/19 0001      Self-Care   Self-Care Other Self-Care Comments    Other Self-Care Comments  how to use the therawand to massage around the anus to relax the muslces                  PT Education - 09/22/19 1010    Education Details Access Code: [email or web address]: [email or web address]: 08/25/2021Prepared by: Malachy Mood GrayExercisesSidelying Diaphragmatic Breathing - 4 x daily - 7 x weekly - 1 sets    Person(s) Educated Patient    Methods Explanation;Demonstration;Handout    Comprehension Returned demonstration;Verbalized understanding            PT Short Term Goals - 07/29/19 1106      PT SHORT TERM GOAL #1   Title Pt will be I with HEP for strength and ROM within limits of surgical protocol    Time 4    Period Weeks    Status Achieved    Target Date 06/21/19      PT SHORT TERM GOAL #2   Title Pt will be able to improve transfers in the clinic with mod I (stand-pivot and bed mobility)     Baseline independent    Time 4    Period Weeks    Status Achieved      PT SHORT TERM GOAL #3   Title Pt will be able to use L UE to assist with ADLs below shoulder height with min increase in pain    Time 4    Period Weeks    Status Achieved    Target Date 06/21/19      PT SHORT TERM GOAL #4   Title deferred    Baseline --    Time --    Period --    Status --             PT Long Term Goals - 08/12/19 1218      Additional Long Term Goals   Additional Long Term Goals Yes      PT LONG TERM GOAL #6   Title reduce the discomfort in the right levator ani to minimal to none    Time 12    Period Weeks    Status New    Target Date 11/04/19      PT LONG TERM GOAL #7   Title able to fully relax to have a bowel movement with no discomfort    Time 12    Period Weeks    Status New    Target Date 11/04/19      PT LONG TERM GOAL #8   Title independent with discomfort management techniques and pelvic floor control    Time 12    Period Weeks    Status New    Target Date 11/04/19                 Plan - 09/22/19 0959    Clinical Impression Statement Patient was having trouble using the ball she has to massage around the anus. Educated patient on how to use the theracane in sidely. Patient was able to go from 12 uv to  4 uv wiht her pelvic floor resting tone. She was educated on diaphragmatic breathing and opening the anal area and not clench her gluteals. Patient had less discomfort in the anal area after therapy. Patient will benefit from skilled therapy to improve tissue mobiity and control to reduce fecal leakge and discomfort.    Personal Factors and Comorbidities Comorbidity 3+;Past/Current Experience;Social Background;Finances;Behavior Pattern    Comorbidities previous neck and back surgeries, morbid obesity, O2 dependent , lives alone    Examination-Activity Limitations Bed Mobility;Reach Overhead;Locomotion  Level;Transfers;Lift;Hygiene/Grooming;Toileting;Stand;Dressing;Stairs;Squat;Carry;Bend    Examination-Participation Restrictions Church;Meal Prep;Cleaning;Community Activity;Driving;Interpersonal Relationship;Laundry;Shop    Stability/Clinical Decision Making Stable/Uncomplicated    Rehab Potential Excellent    PT Frequency 1x / week    PT Duration 12 weeks    PT Treatment/Interventions ADLs/Self Care Home Management;Patient/family education;Functional mobility training;Therapeutic activities;Moist Heat;Therapeutic exercise;Cryotherapy;Electrical Stimulation;Manual techniques;Neuromuscular re-education;Taping;Passive range of motion    PT Next Visit Plan continues with the pelvic floor EMG; work on relaxation and control with contraction    PT Home Exercise Plan Access Code: [email or web address]    Recommended Other Services MD signed pelvic floor initial note    Consulted and Agree with Plan of Care Patient           Patient will benefit from skilled therapeutic intervention in order to improve the following deficits and impairments:  Abnormal gait, Decreased range of motion, Difficulty walking, Obesity, Impaired UE functional use, Decreased endurance, Cardiopulmonary status limiting activity, Decreased activity tolerance, Pain, Impaired flexibility, Decreased balance, Decreased strength, Decreased mobility, Postural dysfunction, Increased muscle spasms  Visit Diagnosis: Other lack of coordination  Rectal pain  Muscle weakness (generalized)     Problem List Patient Active Problem List   Diagnosis Date Noted  . Skin ulcer of left thigh, limited to breakdown of skin ([place]) 09/09/2019  . Ulnar nerve compression 08/06/2019  . Diabetic neuropathy associated with type 2 diabetes mellitus ([place]) 08/06/2019  . Body mass index 50.0-59.9, adult ([place]) 06/12/2019  . Impingement syndrome of left shoulder 05/07/2019  . Polyp of colon   . Polyp of ascending colon   . Rectal discomfort 02/12/2019  .  Osteoarthritis of right hip 02/12/2019  . Nontraumatic tear of left supraspinatus tendon 12/30/2018  . Gout 12/17/2018  . Nontraumatic complete tear of right rotator cuff 12/08/2018  . Rotator cuff tear, right 12/01/2018  . Right groin pain 07/20/2018  . Depressed mood 07/13/2018  . Paroxysmal SVT (supraventricular tachycardia) ([place]) 03/20/2018  . Pure hypercholesterolemia 03/20/2018  . Muscle cramping 02/25/2018  . Nodule of upper lobe of right lung 02/13/2018  . Falls, subsequent encounter 01/30/2018  . Pain in left foot 11/04/2017  . Essential hypertension 10/27/2017  . Solitary pulmonary nodule 10/07/2017  . Restrictive lung disease secondary to obesity 10/01/2017  . Polycythemia, secondary 10/01/2017  . Chronic viral hepatitis B without delta-agent ([name]) 07/08/2017  . COPD (chronic obstructive pulmonary disease) with chronic bronchitis ([place]) 06/27/2017  . Chronic kidney disease (CKD), stage IV (severe) ([place]) 01/14/2017  . Type 2 diabetes mellitus with stage 3 chronic kidney disease, with long-term current use of insulin ([place]) 12/12/2016  . Urge incontinence of urine 12/05/2016  . Trigger finger, left index finger 07/15/2016  . Back pain 04/07/2014  . Morbid obesity ([place]) 10/02/2012  . Chronic diastolic CHF (congestive heart failure) ([place]) 04/07/2012  . GERD 01/26/2010  . Obstructive sleep apnea treated with BiPAP 02/03/2008  . FIBROCYSTIC BREAST DISEASE 10/28/2006  . Hyperlipidemia 03/27/2006  . Obesity hypoventilation syndrome ([place]) 03/27/2006  . Former tobacco use 03/27/2006  .  Depression with anxiety 03/27/2006    Earlie Counts, PT 09/22/19 10:16 AM   [facility] [address], [place], [place], [address] Phone: [phone]   Fax:  [phone]  Name: VENNESSA AFFINITO MRN: [id number] Date of Birth: [birth year] PHYSICAL THERAPY DISCHARGE SUMMARY  Visits from Start of Care: 11  Current functional level  related to goals / functional outcomes: See above.    Remaining deficits: See above. Patient has not returned to physical therapy since her last visit on 09/22/2019.    Education / Equipment: HEP Plan:                                                    Patient goals were not met. Patient is being discharged due to not returning since the last visit. Thank you for the referral. Earlie Counts, PT 12/02/19 3:17 PM   ?????

## 2019-09-23 ENCOUNTER — Ambulatory Visit (INDEPENDENT_AMBULATORY_CARE_PROVIDER_SITE_OTHER): Payer: Medicare Other | Admitting: Family Medicine

## 2019-09-23 ENCOUNTER — Encounter: Payer: Self-pay | Admitting: Family Medicine

## 2019-09-23 ENCOUNTER — Ambulatory Visit: Payer: Self-pay

## 2019-09-23 DIAGNOSIS — M1611 Unilateral primary osteoarthritis, right hip: Secondary | ICD-10-CM

## 2019-09-23 NOTE — Progress Notes (Signed)
Office Visit Note   Patient: [name]           Date of Birth: 08/29/1965           MRN: [id number] Visit Date: 09/23/2019 Requested by: [name], MD [address],  [place] [id number] PCP: [name], MD  Subjective: Chief Complaint  Patient presents with  . Right Hip - Pain    Requests repeat right hip injection - last had 06/11/19.    HPI: She is here with persistent right hip pain.  Injection in May unfortunately did not really give significant relief.  She is hoping to have bariatric surgery in a year and then proceed with hip replacement.  On her own with the help of nutritionist, she has lost 59 pounds already this year.               ROS:   All other systems were reviewed and are negative.  Objective: Vital Signs: LMP 04/10/2006   Physical Exam:  General:  Alert and oriented, in no acute distress. Pulm:  Breathing unlabored. Psy:  Normal mood, congruent affect.  Right hip: Pain and decreased range of motion with internal rotation.  Imaging: US Guided Needle Placement  Result Date: 09/23/2019  Ultrasound-guided right hip injection: After sterile prep with Betadine, injected 8 cc 1% lidocaine without epinephrine and 40 mg methylprednisolone using a 22-gauge spinal needle, passing the needle through the iliofemoral ligament into the femoral head/neck junction.  Injectate was seen filling the joint capsule.  She had good immediate relief.    Assessment & Plan: 1.  Right hip osteoarthritis -Injection given as above.  This time she felt good during the anesthetic phase.  If she does not get lasting relief from cortisone, we could try dextrose prolotherapy next.     Procedures: No procedures performed  No notes on file     PMFS History: Patient Active Problem List   Diagnosis Date Noted  . Skin ulcer of left thigh, limited to breakdown of skin ([place]) 09/09/2019  . Ulnar nerve compression 08/06/2019  . Diabetic neuropathy associated  with type 2 diabetes mellitus ([place]) 08/06/2019  . Body mass index 50.0-59.9, adult ([place]) 06/12/2019  . Impingement syndrome of left shoulder 05/07/2019  . Polyp of colon   . Polyp of ascending colon   . Rectal discomfort 02/12/2019  . Osteoarthritis of right hip 02/12/2019  . Nontraumatic tear of left supraspinatus tendon 12/30/2018  . Gout 12/17/2018  . Nontraumatic complete tear of right rotator cuff 12/08/2018  . Rotator cuff tear, right 12/01/2018  . Right groin pain 07/20/2018  . Depressed mood 07/13/2018  . Paroxysmal SVT (supraventricular tachycardia) ([place]) 03/20/2018  . Pure hypercholesterolemia 03/20/2018  . Muscle cramping 02/25/2018  . Nodule of upper lobe of right lung 02/13/2018  . Falls, subsequent encounter 01/30/2018  . Pain in left foot 11/04/2017  . Essential hypertension 10/27/2017  . Solitary pulmonary nodule 10/07/2017  . Restrictive lung disease secondary to obesity 10/01/2017  . Polycythemia, secondary 10/01/2017  . Chronic viral hepatitis B without delta-agent ([place]) 07/08/2017  . COPD (chronic obstructive pulmonary disease) with chronic bronchitis ([place]) 06/27/2017  . Chronic kidney disease (CKD), stage IV (severe) ([place]) 01/14/2017  . Type 2 diabetes mellitus with stage 3 chronic kidney disease, with long-term current use of insulin ([place]) 12/12/2016  . Urge incontinence of urine 12/05/2016  . Trigger finger, left index finger 07/15/2016  . Back pain 04/07/2014  . Morbid obesity ([place]) 10/02/2012  .  Chronic diastolic CHF (congestive heart failure) ([place]) 04/07/2012  . GERD 01/26/2010  . Obstructive sleep apnea treated with BiPAP 02/03/2008  . FIBROCYSTIC BREAST DISEASE 10/28/2006  . Hyperlipidemia 03/27/2006  . Obesity hypoventilation syndrome ([place]) 03/27/2006  . Former tobacco use 03/27/2006  . Depression with anxiety 03/27/2006   Past Medical History:  Diagnosis Date  . Alcoholism ([place])   . Anxiety   . Arthritis 04-10-11   hips, shoulders, back  .  Asthma   . Bipolar disorder ([place])   . Cardiomegaly   . Carpal tunnel syndrome, bilateral   . Cervical cancer ([place]) 1993   cervical, no treatment done, went away per pt  . Cervical dysplasia or atypia 04-10-11   '93- once dx.-got pregnant-no intervention, then postpartum, no dysplasia found  . CHF (congestive heart failure) ([place])    no cardiologist 2014 dx, none now  . Chronic hypoxemic respiratory failure ([place])   . Chronic kidney disease    stage 3 kidney disease  . 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 Pathology showed Condyloma, gluteal CONDYLOMA ACUMINATUM.   [name] Kitchen COPD (chronic obstructive pulmonary disease) ([place])   . Depression   . Diabetes mellitus without complication ([place])   . Dyspnea    with actity, sitting  . Fall 12/16/2016  . Fibrocystic breast disease   . GERD (gastroesophageal reflux disease)   . Grade I diastolic dysfunction 82/42/3536   Noted on ECHO  . Hepatitis    hep B-count is low at present,doesn't register  . History of PSVT (paroxysmal supraventricular tachycardia)   . Hypercalcemia   . Hyperlipidemia   . Hypertension   . Incomplete left bundle branch block (LBBB) 09/24/2018   Noted on EKG  . Lung nodule 11/2017   right and left lung  . Migraine headache    none recent  . Morbid obesity ([place]) 03/27/2006  . Neuropathy   . Pneumonia    walking pneu 15 yrs. ago  . Skin lesion 03/15/2011   In gluteal crease now s/p removal by Dr. [name] of General Surgery on 3/12. Path shows condyloma.     . Sleep apnea 04-10-11   uses cpap, pt does not know settings  . Trigger finger    left third  . Urge incontinence of urine     Family History  Problem Relation Age of Onset  . Pulmonary embolism Mother   . Stroke Father   . Alcohol abuse Father   . Pneumonia Father   . Hypertension Father   . Breast cancer Maternal Aunt   . Hypertension Other   . Diabetes Other        Aunts and cousins  . Asthma Sister   . Depression Sister   .  Arthritis Sister   . Sickle cell anemia Son     Past Surgical History:  Procedure Laterality Date  . ANAL FISTULECTOMY  04/17/2011   Procedure: FISTULECTOMY ANAL;  Surgeon: Imogene Burn. [name], MD;  Location: [facility];  Service: General;  Laterality: N/A;  Excision of Condyloma Gluteal Cleft   . ANAL RECTAL MANOMETRY N/A 03/26/2019   Procedure: ANO RECTAL MANOMETRY;  Surgeon: Thornton Park, MD;  Location: [facility];  Service: Gastroenterology;  Laterality: N/A;  . BIOPSY  03/09/2019   Procedure: BIOPSY;  Surgeon: Thornton Park, MD;  Location: [facility];  Service: Gastroenterology;;  . Lum [place] INJECTION N/A 12/29/2018   Procedure: BOTOX INJECTION(100 UNITS)  WITH CYSTOSCOPY;  Surgeon: Ceasar Mons, MD;  Location: [facility];  Service: Urology;  Laterality: N/A;  . BOTOX INJECTION N/A 07/27/2019   Procedure: BOTOX INJECTION WITH CYSTOSCOPY;  Surgeon: Ceasar Mons, MD;  Location: [facility];  Service: Urology;  Laterality: N/A;  ONLY NEEDS 30 MIN  . BREAST CYST EXCISION     bilateral breast, 3 cysts removed from each breast  . BREAST EXCISIONAL BIOPSY Right    x 3  . BREAST EXCISIONAL BIOPSY Left    x 3  . CARPAL TUNNEL RELEASE Bilateral   . Cervical biospy    . CERVICAL FUSION  04-10-11   x3- cervical fusion with plating and screws-Dr. [name]  . COLONOSCOPY WITH PROPOFOL N/A 07/06/2015   Procedure: COLONOSCOPY WITH PROPOFOL;  Surgeon: [name], MD;  [facility];  Service: Endoscopy;  Laterality: N/A;  . COLONOSCOPY WITH PROPOFOL N/A 03/09/2019   Procedure: COLONOSCOPY WITH PROPOFOL;  Surgeon: Thornton Park, MD;  [facility];  Service: Gastroenterology;  Laterality: N/A;  . DOPPLER ECHOCARDIOGRAPHY  04/06/2012   AT [facility] 55-60%  . FOOT SURGERY Left 2018  . FRACTURE SURGERY     little toe left foot  . I & D EXTREMITY Left 12/18/2016   Procedure: IRRIGATION AND DEBRIDEMENT LEFT FOOT, CLOSURE;  Surgeon: [name], MD;  Location: [facility];  Service: Orthopedics;  Laterality: Left;  . [place] SURGERY  04-10-11   x5-Lumbar fusion-retained hardware.(Dr. Louanne Skye)  . MULTIPLE TOOTH EXTRACTIONS    . POLYPECTOMY  03/09/2019   Procedure: POLYPECTOMY;  Surgeon: Thornton Park, MD;  [facility];  Service: Gastroenterology;;  . SHOULDER ARTHROSCOPY WITH ROTATOR CUFF REPAIR AND SUBACROMIAL DECOMPRESSION Left 05/07/2019   Procedure: LEFT SHOULDER ARTHROSCOPY WITH DEBRIDEMENT, DISTAL CLAVICLE EXCISION,  SUBACROMIAL DECOMPRESSION AND POSSIBLE ROTATOR CUFF REPAIR;  Surgeon: [name], MD;  Location: [facility];  Service: Orthopedics;  Laterality: Left;  . TEE WITHOUT CARDIOVERSION N/A 04/06/2012   Procedure: TRANSESOPHAGEAL ECHOCARDIOGRAM (TEE);  Surgeon: [name], MD;  Location: [facility];  Service: Cardiovascular;  Laterality: N/A;  . TRIGGER FINGER RELEASE Right    middle finger  . TRIGGER FINGER RELEASE Left 06/28/2016   Procedure: RELEASE TRIGGER FINGER LEFT 3RD FINGER;  Surgeon: [name], MD;  Location: [facility];  Service: Orthopedics;  Laterality: Left;  . TRIGGER FINGER RELEASE Right 06/12/2016   Procedure: RIGHT INDEX FINGER TRIGGER RELEASE;  Surgeon: [name], MD;  Location: [facility];  Service: Orthopedics;  Laterality: Right;  . TRIGGER FINGER RELEASE Right 01/19/2018   Procedure: RIGHT RING FINGER TRIGGER FINGER RELEASE;  Surgeon: [name], MD;  Location: [facility];  Service: Orthopedics;  Laterality: Right;  . TRIGGER FINGER RELEASE Left 05/07/2019   Procedure: RELEASE TRIGGER FINGER LEFT INDEX FINGER;  Surgeon: [name], MD;  Location: [facility];  Service: Orthopedics;  Laterality: Left;   Social History   Occupational History  . Occupation: disabled    Comment: Disabled  Tobacco Use  . Smoking status: Former Smoker    Packs/day: 0.20    Years: 40.00    Pack years: 8.00    Types: Cigarettes    Start date: 01/28/1977    Quit date: 07/25/2019    Years since quitting: 0.1  . Smokeless tobacco: Never  Used  . Tobacco comment: 4-5 cigarettes per day  Vaping Use  . Vaping Use: Never used  Substance and Sexual Activity  . Alcohol use: No    Alcohol/week: 0.0 standard drinks  . Drug use: Not Currently  Types: Marijuana, "Crack" cocaine    Comment: hx marijuana usemany years ago, Crack -none since 11/2015  . Sexual activity: Not Currently    Partners: Male    Birth control/protection: Post-menopausal

## 2019-09-24 ENCOUNTER — Encounter: Payer: Medicare Other | Admitting: Physical Therapy

## 2019-09-24 DIAGNOSIS — G894 Chronic pain syndrome: Secondary | ICD-10-CM | POA: Diagnosis not present

## 2019-09-24 DIAGNOSIS — M5136 Other intervertebral disc degeneration, lumbar region: Secondary | ICD-10-CM | POA: Diagnosis not present

## 2019-09-24 DIAGNOSIS — Z79899 Other long term (current) drug therapy: Secondary | ICD-10-CM | POA: Diagnosis not present

## 2019-09-28 ENCOUNTER — Other Ambulatory Visit: Payer: Self-pay

## 2019-09-28 ENCOUNTER — Encounter: Payer: Self-pay | Admitting: Family Medicine

## 2019-09-28 ENCOUNTER — Ambulatory Visit (INDEPENDENT_AMBULATORY_CARE_PROVIDER_SITE_OTHER): Payer: Medicare Other | Admitting: Family Medicine

## 2019-09-28 VITALS — BP 126/78 | HR 103 | Wt 350.8 lb

## 2019-09-28 DIAGNOSIS — L97121 Non-pressure chronic ulcer of left thigh limited to breakdown of skin: Secondary | ICD-10-CM

## 2019-09-28 MED ORDER — MUPIROCIN 2 % EX OINT: 1.0000 "application " | TOPICAL_OINTMENT | Freq: Two times a day (BID) | CUTANEOUS | 0 refills | Status: DC

## 2019-09-28 NOTE — Progress Notes (Signed)
    SUBJECTIVE:   CHIEF COMPLAINT / HPI:   Wound follow-up Patient presents with wound on her left leg.  This is a follow-up visit from her previous visit where she was seen and evaluated.  She was prescribed mupirocin ointment which she has been applying daily since being seen.  She reports the wound is still there and just does not seem to be healing.  Denies any pain, redness, warmth, drainage, fevers.  She is just concerned because she wants the wound to close up.   OBJECTIVE:   BP 126/78   Pulse (!) 103   Wt (!) 350 lb 12.8 oz (159.1 kg)   LMP 04/10/2006   SpO2 94%   BMI 52.56 kg/m   General: Well-appearing, no acute distress Respiratory: Breathing, lungs clear to auscultation bilaterally Cardiac: Regular rate and rhythm, no murmurs appreciated Psych: Patient is in a wonderful mood, feels she is doing really well regarding her depression Plan: See image below, her wound appears mildly improved from previous evaluation, no warmth, erythema noted around the wound      ASSESSMENT/PLAN:   Skin ulcer of left thigh, limited to breakdown of skin (HCC) Wound appears mildly improved from previous evaluation.  She has been using mupirocin ointment in the morning and Vaseline at night.  She is kept the area clean.  Denies any infectious symptoms such as fever, chills, increased pain, drainage.  Patient provided with Exoderm patches and recommended changing every 4 days after cleaning and coating with bacitracin. -Discussed signs and symptoms of infection -Strict return precautions given -Patient will follow up in 3 weeks     [name], MD [place]

## 2019-09-28 NOTE — Patient Instructions (Signed)
It was wonderful to see you this afternoon.  I am glad you are doing so well!  Regarding your wound on your leg I would like for you to keep these bandages on for 4 days at a time using the bacitracin ointment at changes.  I have scheduled you for a follow-up appointment on 9/22.  If you notice any warmth, increased drainage, increased pain please call and schedule an early appointment.  I hope you have a wonderful afternoon and congratulations on all the good work!

## 2019-09-29 NOTE — Assessment & Plan Note (Signed)
Wound appears mildly improved from previous evaluation.  She has been using mupirocin ointment in the morning and Vaseline at night.  She is kept the area clean.  Denies any infectious symptoms such as fever, chills, increased pain, drainage.  Patient provided with Exoderm patches and recommended changing every 4 days after cleaning and coating with bacitracin. -Discussed signs and symptoms of infection -Strict return precautions given -Patient will follow up in 3 weeks

## 2019-10-01 ENCOUNTER — Encounter: Payer: Self-pay | Admitting: Family Medicine

## 2019-10-05 DIAGNOSIS — M109 Gout, unspecified: Secondary | ICD-10-CM | POA: Diagnosis not present

## 2019-10-05 DIAGNOSIS — G4733 Obstructive sleep apnea (adult) (pediatric): Secondary | ICD-10-CM | POA: Diagnosis not present

## 2019-10-05 DIAGNOSIS — J9621 Acute and chronic respiratory failure with hypoxia: Secondary | ICD-10-CM | POA: Diagnosis not present

## 2019-10-05 DIAGNOSIS — Z79899 Other long term (current) drug therapy: Secondary | ICD-10-CM | POA: Diagnosis not present

## 2019-10-05 DIAGNOSIS — M5090 Cervical disc disorder, unspecified, unspecified cervical region: Secondary | ICD-10-CM | POA: Diagnosis not present

## 2019-10-08 ENCOUNTER — Ambulatory Visit: Payer: Medicare Other | Admitting: Physical Therapy

## 2019-10-08 ENCOUNTER — Other Ambulatory Visit: Payer: Self-pay

## 2019-10-09 ENCOUNTER — Other Ambulatory Visit: Payer: Self-pay | Admitting: Cardiology

## 2019-10-09 DIAGNOSIS — I471 Supraventricular tachycardia: Secondary | ICD-10-CM

## 2019-10-12 ENCOUNTER — Encounter: Payer: Self-pay | Admitting: Family Medicine

## 2019-10-13 ENCOUNTER — Other Ambulatory Visit: Payer: Self-pay | Admitting: Cardiology

## 2019-10-13 DIAGNOSIS — M5090 Cervical disc disorder, unspecified, unspecified cervical region: Secondary | ICD-10-CM | POA: Diagnosis not present

## 2019-10-13 DIAGNOSIS — I471 Supraventricular tachycardia: Secondary | ICD-10-CM

## 2019-10-13 DIAGNOSIS — G4733 Obstructive sleep apnea (adult) (pediatric): Secondary | ICD-10-CM | POA: Diagnosis not present

## 2019-10-14 ENCOUNTER — Other Ambulatory Visit: Payer: Self-pay | Admitting: Cardiology

## 2019-10-14 DIAGNOSIS — R600 Localized edema: Secondary | ICD-10-CM

## 2019-10-14 DIAGNOSIS — I5022 Chronic systolic (congestive) heart failure: Secondary | ICD-10-CM

## 2019-10-14 DIAGNOSIS — E8770 Fluid overload, unspecified: Secondary | ICD-10-CM

## 2019-10-18 DIAGNOSIS — M25511 Pain in right shoulder: Secondary | ICD-10-CM | POA: Diagnosis not present

## 2019-10-18 DIAGNOSIS — Z79899 Other long term (current) drug therapy: Secondary | ICD-10-CM | POA: Diagnosis not present

## 2019-10-18 DIAGNOSIS — G894 Chronic pain syndrome: Secondary | ICD-10-CM | POA: Diagnosis not present

## 2019-10-20 ENCOUNTER — Other Ambulatory Visit: Payer: Self-pay | Admitting: General Surgery

## 2019-10-20 ENCOUNTER — Other Ambulatory Visit: Payer: Self-pay

## 2019-10-20 ENCOUNTER — Encounter: Payer: Self-pay | Admitting: Podiatry

## 2019-10-20 ENCOUNTER — Other Ambulatory Visit (HOSPITAL_COMMUNITY): Payer: Self-pay | Admitting: General Surgery

## 2019-10-20 ENCOUNTER — Ambulatory Visit (INDEPENDENT_AMBULATORY_CARE_PROVIDER_SITE_OTHER): Payer: Medicare Other | Admitting: Podiatry

## 2019-10-20 ENCOUNTER — Ambulatory Visit: Payer: Medicare Other | Admitting: Family Medicine

## 2019-10-20 DIAGNOSIS — E1142 Type 2 diabetes mellitus with diabetic polyneuropathy: Secondary | ICD-10-CM

## 2019-10-20 DIAGNOSIS — M79674 Pain in right toe(s): Secondary | ICD-10-CM

## 2019-10-20 DIAGNOSIS — B351 Tinea unguium: Secondary | ICD-10-CM | POA: Diagnosis not present

## 2019-10-20 DIAGNOSIS — M79675 Pain in left toe(s): Secondary | ICD-10-CM | POA: Diagnosis not present

## 2019-10-20 NOTE — Progress Notes (Signed)
This patient returns to my office for at risk foot care.  This patient requires this care by a professional since this patient will be at risk due to having chronic kidney disease and type 2 diabetes.  This patient is unable to cut nails herself since the patient cannot reach her nails.These nails are painful walking and wearing shoes.  This patient presents for at risk foot care today.  General Appearance  Alert, conversant and in no acute stress.  Vascular  Dorsalis pedis and posterior tibial  pulses are palpable  bilaterally.  Capillary return is within normal limits  bilaterally. Temperature is within normal limits  bilaterally.  Neurologic  Senn-Weinstein monofilament wire test diminished  bilaterally. Muscle power within normal limits bilaterally.  Nails Thick disfigured discolored nails with subungual debris  from hallux to fifth toes bilaterally. No evidence of bacterial infection or drainage bilaterally.  Orthopedic  No limitations of motion  feet .  No crepitus or effusions noted.  No bony pathology or digital deformities noted.  HAV  Left foot.  Skin  normotropic skin with no porokeratosis noted bilaterally.  No signs of infections or ulcers noted.   Thickened hyperkeratotic tissue  right foot plantarly.  Onychomycosis  Pain in right toes  Pain in left toes  Consent was obtained for treatment procedures.   Mechanical debridement of nails 1-5  bilaterally performed with a nail nipper.  Filed with dremel without incident.    Return office visit   9 weeks                 Told patient to return for periodic foot care and evaluation due to potential at risk complications.   Gardiner Barefoot DPM

## 2019-10-21 ENCOUNTER — Encounter: Payer: Medicare Other | Attending: General Surgery | Admitting: Skilled Nursing Facility1

## 2019-10-21 ENCOUNTER — Other Ambulatory Visit: Payer: Self-pay

## 2019-10-21 DIAGNOSIS — E669 Obesity, unspecified: Secondary | ICD-10-CM

## 2019-10-21 DIAGNOSIS — Z713 Dietary counseling and surveillance: Secondary | ICD-10-CM | POA: Insufficient documentation

## 2019-10-21 NOTE — Progress Notes (Signed)
Medical Nutrition Therapy  Patient was seen on 09/16/2019 for Pre-Operative Nutrition Assessment. Letter of approval faxed to [facility] bariatric surgery program coordinator on 09/16/2019  Referral stated Supervised Weight Loss (SWL) visits needed: pt states [facility] told her she needs 1 more after today (10/21/2019)  Planned surgery: Sleeve Gastrectomy  Pt expectation of surgery: to lose weight Pt expectation of dietitian: to guide    NUTRITION ASSESSMENT   Anthropometrics  Start weight at [facility]: 405.6 lbs (date: 01/15/2018)  Wt today: 351.7 pounds Height: 68.5 in BMI: 52.70 kg/m2     Clinical  Medical hx: CHF, OSA, COPD, GERD, Diabetes, CKD, hyperlipidemia, GOUT Medications: fast and long acting insulin  Labs: WBC 11.4, RBC 6.05, Hemoglobin 16.6, HCT 49,  K 3.2, BUN 32, Creatinine 1.48, GFR 46 Notable signs/symptoms:  Any previous deficiencies? No  Micronutrient Nutrition Focused Physical Exam: Hair: No issues observed Eyes: No issues observed Mouth: No issues observed Neck: No issues observed Nails: No issues observed Skin: No issues observed  Body Composition Scale 10/21/2019  Current Body Weight 351.7  Total Body Fat % 50.4  Visceral Fat 23  Fat-Free Mass % 49.5   Total Body Water % 39.2  Muscle-Mass lbs 35.8  BMI 50.6  Body Fat Displacement          Torso  lbs 110.3         Left Leg  lbs 22         Right Leg  lbs 22         Left Arm  lbs 11         Right Arm   lbs 11      Lifestyle & Dietary Hx  Pt states she has not had reflux since making dietary changes (has been taking omeprazole as well).  Pt states she air fries all her food sand chooses cauliflower rice and other starches instead of the actual food. Pt states she knows she needs to stop her diet gingerale so she is working on that.  Pt states she learned a lot when she came in here for her diabetes and loves to learn. Pt states next month she is going to get her vitamins. Pt states she  has completely stopped eating in the middle of the night so he blood sugars are looking much better (fasting 184 sometimes 147). Pt states when she first came in here in a wheel chair and on oxygen and feels so good now.    24-Hr Dietary Recall First Meal: fruit  Snack: fruit Second Meal: fish + caulfilower + french fries or turley sandwich Snack: fruit Third Meal:  fish + caulfilower + french fries  Snack: fruit Beverages: slim fast, powerade zero, diet gingerale, water + flavoring    Estimated Energy Needs Calories: 1600   NUTRITION DIAGNOSIS  Overweight/obesity (-3.3) related to past poor dietary habits and physical inactivity as evidenced by patient w/ planned Sleeve Gastrectomy surgery following dietary guidelines for continued weight loss.    NUTRITION INTERVENTION  Nutrition counseling (C-1) and education (E-2) to facilitate bariatric surgery goals.   Pre-Op Goals Reviewed with the Patient -Keep up the great work!   Handouts Provided Include in the past . Bariatric Surgery handouts (Nutrition Visits, Pre-Op Goals, Protein Shakes, Vitamins & Minerals)  Learning Style & Readiness for Change Teaching method utilized: Visual & Auditory  Demonstrated degree of understanding via: Teach Back  Barriers to learning/adherence to lifestyle change: none identified      MONITORING & EVALUATION Dietary  intake, weekly physical activity, body weight, and pre-op goals reached at next nutrition visit.    Next Steps  Patient is to follow up at [facility] for Pre-Op Class >2 weeks before surgery for further nutrition education.

## 2019-10-22 ENCOUNTER — Encounter: Payer: Medicare Other | Admitting: Physical Therapy

## 2019-10-25 DIAGNOSIS — N189 Chronic kidney disease, unspecified: Secondary | ICD-10-CM | POA: Diagnosis not present

## 2019-10-25 DIAGNOSIS — N1831 Chronic kidney disease, stage 3a: Secondary | ICD-10-CM | POA: Diagnosis not present

## 2019-10-26 ENCOUNTER — Ambulatory Visit (HOSPITAL_COMMUNITY)
Admission: RE | Admit: 2019-10-26 | Discharge: 2019-10-26 | Disposition: A | Discharge status: Home / Self Care | Payer: Medicare Other | Source: Ambulatory Visit | Attending: General Surgery | Admitting: General Surgery

## 2019-10-26 ENCOUNTER — Encounter: Payer: Self-pay | Admitting: Family Medicine

## 2019-10-26 ENCOUNTER — Other Ambulatory Visit: Payer: Self-pay

## 2019-10-26 DIAGNOSIS — Z01818 Encounter for other preprocedural examination: Secondary | ICD-10-CM | POA: Diagnosis not present

## 2019-10-28 ENCOUNTER — Other Ambulatory Visit: Payer: Self-pay | Admitting: Family Medicine

## 2019-10-28 DIAGNOSIS — Z1231 Encounter for screening mammogram for malignant neoplasm of breast: Secondary | ICD-10-CM

## 2019-10-29 ENCOUNTER — Encounter: Payer: Self-pay | Admitting: Physical Therapy

## 2019-10-29 ENCOUNTER — Ambulatory Visit: Payer: Medicare Other | Admitting: Physical Therapy

## 2019-11-03 ENCOUNTER — Ambulatory Visit: Payer: Medicare Other | Admitting: Gastroenterology

## 2019-11-03 ENCOUNTER — Ambulatory Visit: Payer: Medicare Other | Admitting: Orthopaedic Surgery

## 2019-11-04 ENCOUNTER — Ambulatory Visit: Payer: Self-pay

## 2019-11-04 ENCOUNTER — Other Ambulatory Visit: Payer: Self-pay

## 2019-11-04 ENCOUNTER — Encounter: Payer: Self-pay | Admitting: Family Medicine

## 2019-11-04 ENCOUNTER — Ambulatory Visit (INDEPENDENT_AMBULATORY_CARE_PROVIDER_SITE_OTHER): Payer: Medicare Other | Admitting: Family Medicine

## 2019-11-04 DIAGNOSIS — M1611 Unilateral primary osteoarthritis, right hip: Secondary | ICD-10-CM

## 2019-11-04 DIAGNOSIS — J9621 Acute and chronic respiratory failure with hypoxia: Secondary | ICD-10-CM | POA: Diagnosis not present

## 2019-11-04 DIAGNOSIS — M5090 Cervical disc disorder, unspecified, unspecified cervical region: Secondary | ICD-10-CM | POA: Diagnosis not present

## 2019-11-04 DIAGNOSIS — G4733 Obstructive sleep apnea (adult) (pediatric): Secondary | ICD-10-CM | POA: Diagnosis not present

## 2019-11-04 NOTE — Progress Notes (Signed)
Office Visit Note   Patient: [name]           Date of Birth: December 27, 1965           MRN: [id number] Visit Date: 11/04/2019 Requested by: [name], MD [address],  [place] [address] PCP: [name], MD  Subjective: Chief Complaint  Patient presents with  . Right Hip - Pain    The cortisone injection in August lasted about 3 weeks. She is having pain in the same area, but also into the pubic region.    HPI: She is here with recurrent right hip pain.  Last injection gave her 3 weeks of relief.  She is interested in trying dextrose prolotherapy.  In the past year she has lost more than 70 pounds by changing her diet.  She needs to lose to a weight of 270 pounds to have a BMI of 40.  At that point she will be able to have hip replacement.              ROS:   All other systems were reviewed and are negative.  Objective: Vital Signs: LMP 04/10/2006   Physical Exam:  General:  Alert and oriented, in no acute distress. Pulm:  Breathing unlabored. Psy:  Normal mood, congruent affect.  Right hip: She has pain with passive internal rotation.  Imaging: US Guided Needle Placement - No Linked Charges  Result Date: 11/04/2019 Ultrasound-guided right hip injection: After sterile prep with Betadine, injected 6 cc 1% lidocaine without epinephrine and 4 cc 50% dextrose using a 22-gauge spinal needle, passing the needle through the iliofemoral ligament into the femoral head/neck junction.  Injectate seen filling joint capsule.  Modest immediate improvement.     Assessment & Plan: 1.  Right hip DJD -Dextrose prolotherapy today.  Could repeat when needed. -Applauded her efforts at weight loss     Procedures: No procedures performed  No notes on file     PMFS History: Patient Active Problem List   Diagnosis Date Noted  . Skin ulcer of left thigh, limited to breakdown of skin ([place]) 09/09/2019  . Ulnar nerve compression 08/06/2019  . Diabetic neuropathy  associated with type 2 diabetes mellitus ([place]) 08/06/2019  . Body mass index 50.0-59.9, adult ([place]) 06/12/2019  . Impingement syndrome of left shoulder 05/07/2019  . Polyp of colon   . Polyp of ascending colon   . Rectal discomfort 02/12/2019  . Osteoarthritis of right hip 02/12/2019  . Nontraumatic tear of left supraspinatus tendon 12/30/2018  . Gout 12/17/2018  . Nontraumatic complete tear of right rotator cuff 12/08/2018  . Rotator cuff tear, right 12/01/2018  . Right groin pain 07/20/2018  . Depressed mood 07/13/2018  . Paroxysmal SVT (supraventricular tachycardia) ([place]) 03/20/2018  . Pure hypercholesterolemia 03/20/2018  . Muscle cramping 02/25/2018  . Nodule of upper lobe of right lung 02/13/2018  . Falls, subsequent encounter 01/30/2018  . Pain in left foot 11/04/2017  . Essential hypertension 10/27/2017  . Solitary pulmonary nodule 10/07/2017  . Restrictive lung disease secondary to obesity 10/01/2017  . Polycythemia, secondary 10/01/2017  . Chronic viral hepatitis B without delta-agent ([place]) 07/08/2017  . COPD (chronic obstructive pulmonary disease) with chronic bronchitis ([place]) 06/27/2017  . Chronic kidney disease (CKD), stage IV (severe) ([place]) 01/14/2017  . Type 2 diabetes mellitus with stage 3 chronic kidney disease, with long-term current use of insulin ([place]) 12/12/2016  . Urge incontinence of urine 12/05/2016  . Trigger finger, left index finger 07/15/2016  .  Back pain 04/07/2014  . Morbid obesity ([place]) 10/02/2012  . Chronic diastolic CHF (congestive heart failure) ([place]) 04/07/2012  . GERD 01/26/2010  . Obstructive sleep apnea treated with BiPAP 02/03/2008  . FIBROCYSTIC BREAST DISEASE 10/28/2006  . Hyperlipidemia 03/27/2006  . Obesity hypoventilation syndrome ([place]) 03/27/2006  . Former tobacco use 03/27/2006  . Depression with anxiety 03/27/2006   Past Medical History:  Diagnosis Date  . Alcoholism ([place])   . Anxiety   . Arthritis 04-10-11   hips, shoulders,  back  . Asthma   . Bipolar disorder ([place])   . Cardiomegaly   . Carpal tunnel syndrome, bilateral   . Cervical cancer ([place]) 1993   cervical, no treatment done, went away per pt  . Cervical dysplasia or atypia 04-10-11   '93- once dx.-got pregnant-no intervention, then postpartum, no dysplasia found  . CHF (congestive heart failure) ([place])    no cardiologist 2014 dx, none now  . Chronic hypoxemic respiratory failure ([place])   . Chronic kidney disease    stage 3 kidney disease  . 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 Pathology showed Condyloma, gluteal CONDYLOMA ACUMINATUM.   [name] Kitchen COPD (chronic obstructive pulmonary disease) ([place])   . Depression   . Diabetes mellitus without complication ([place])   . Dyspnea    with actity, sitting  . Fall 12/16/2016  . Fibrocystic breast disease   . GERD (gastroesophageal reflux disease)   . Grade I diastolic dysfunction 09/73/5329   Noted on ECHO  . Hepatitis    hep B-count is low at present,doesn't register  . History of PSVT (paroxysmal supraventricular tachycardia)   . Hypercalcemia   . Hyperlipidemia   . Hypertension   . Incomplete left bundle branch block (LBBB) 09/24/2018   Noted on EKG  . Lung nodule 11/2017   right and left lung  . Migraine headache    none recent  . Morbid obesity ([place]) 03/27/2006  . Neuropathy   . Pneumonia    walking pneu 15 yrs. ago  . Skin lesion 03/15/2011   In gluteal crease now s/p removal by Dr. [name] of General Surgery on 3/12. Path shows condyloma.     . Sleep apnea 04-10-11   uses cpap, pt does not know settings  . Trigger finger    left third  . Urge incontinence of urine     Family History  Problem Relation Age of Onset  . Pulmonary embolism Mother   . Stroke Father   . Alcohol abuse Father   . Pneumonia Father   . Hypertension Father   . Breast cancer Maternal Aunt   . Hypertension Other   . Diabetes Other        Aunts and cousins  . Asthma Sister   . Depression Sister    . Arthritis Sister   . Sickle cell anemia Son     Past Surgical History:  Procedure Laterality Date  . ANAL FISTULECTOMY  04/17/2011   Procedure: FISTULECTOMY ANAL;  Surgeon: Imogene Burn. [name], MD;  Location: [facility];  Service: General;  Laterality: N/A;  Excision of Condyloma Gluteal Cleft   . ANAL RECTAL MANOMETRY N/A 03/26/2019   Procedure: ANO RECTAL MANOMETRY;  Surgeon: Thornton Park, MD;  Location: [facility];  Service: Gastroenterology;  Laterality: N/A;  . BIOPSY  03/09/2019   Procedure: BIOPSY;  Surgeon: Thornton Park, MD;  [facility];  Service: Gastroenterology;;  . BOTOX INJECTION N/A 12/29/2018   Procedure: BOTOX INJECTION(100 UNITS)  WITH CYSTOSCOPY;  Surgeon: Ceasar Mons, MD;  [facility]: [facility];  Service: Urology;  Laterality: N/A;  . BOTOX INJECTION N/A 07/27/2019   Procedure: BOTOX INJECTION WITH CYSTOSCOPY;  Surgeon: Ceasar Mons, MD;  Location: [facility];  Service: Urology;  Laterality: N/A;  ONLY NEEDS 30 MIN  . BREAST CYST EXCISION     bilateral breast, 3 cysts removed from each breast  . BREAST EXCISIONAL BIOPSY Right    x 3  . BREAST EXCISIONAL BIOPSY Left    x 3  . CARPAL TUNNEL RELEASE Bilateral   . Cervical biospy    . CERVICAL FUSION  04-10-11   x3- cervical fusion with plating and screws-Dr. [name]  . COLONOSCOPY WITH PROPOFOL N/A 07/06/2015   Procedure: COLONOSCOPY WITH PROPOFOL;  Surgeon: [name], MD;  [facility];  Service: Endoscopy;  Laterality: N/A;  . COLONOSCOPY WITH PROPOFOL N/A 03/09/2019   Procedure: COLONOSCOPY WITH PROPOFOL;  Surgeon: Thornton Park, MD;  [facility];  Service: Gastroenterology;  Laterality: N/A;  . DOPPLER ECHOCARDIOGRAPHY  04/06/2012   AT [facility] 55-60%  . FOOT SURGERY Left 2018  . FRACTURE SURGERY     little toe left foot  . I & D EXTREMITY Left 12/18/2016   Procedure: IRRIGATION AND DEBRIDEMENT LEFT FOOT, CLOSURE;  Surgeon: [name], MD;   Location: [facility];  Service: Orthopedics;  Laterality: Left;  . [facility] SURGERY  04-10-11   x5-Lumbar fusion-retained hardware.(Dr. Louanne Skye)  . MULTIPLE TOOTH EXTRACTIONS    . POLYPECTOMY  03/09/2019   Procedure: POLYPECTOMY;  Surgeon: Thornton Park, MD;  [facility];  Service: Gastroenterology;;  . SHOULDER ARTHROSCOPY WITH ROTATOR CUFF REPAIR AND SUBACROMIAL DECOMPRESSION Left 05/07/2019   Procedure: LEFT SHOULDER ARTHROSCOPY WITH DEBRIDEMENT, DISTAL CLAVICLE EXCISION,  SUBACROMIAL DECOMPRESSION AND POSSIBLE ROTATOR CUFF REPAIR;  Surgeon: [name], MD;  Location: [facility];  Service: Orthopedics;  Laterality: Left;  . TEE WITHOUT CARDIOVERSION N/A 04/06/2012   Procedure: TRANSESOPHAGEAL ECHOCARDIOGRAM (TEE);  Surgeon: [name], MD;  Location: [facility];  Service: Cardiovascular;  Laterality: N/A;  . TRIGGER FINGER RELEASE Right    middle finger  . TRIGGER FINGER RELEASE Left 06/28/2016   Procedure: RELEASE TRIGGER FINGER LEFT 3RD FINGER;  Surgeon: [name], MD;  Location: [facility];  Service: Orthopedics;  Laterality: Left;  . TRIGGER FINGER RELEASE Right 06/12/2016   Procedure: RIGHT INDEX FINGER TRIGGER RELEASE;  Surgeon: [name], MD;  Location: [facility];  Service: Orthopedics;  Laterality: Right;  . TRIGGER FINGER RELEASE Right 01/19/2018   Procedure: RIGHT RING FINGER TRIGGER FINGER RELEASE;  Surgeon: [name], MD;  Location: [facility];  Service: Orthopedics;  Laterality: Right;  . TRIGGER FINGER RELEASE Left 05/07/2019   Procedure: RELEASE TRIGGER FINGER LEFT INDEX FINGER;  Surgeon: [name], MD;  Location: [facility];  Service: Orthopedics;  Laterality: Left;   Social History   Occupational History  . Occupation: disabled    Comment: Disabled  Tobacco Use  . Smoking status: Former Smoker    Packs/day: 0.20    Years: 40.00    Pack years: 8.00    Types: Cigarettes    Start date: 01/28/1977    Quit date: 07/25/2019    Years since quitting: 0.2  . Smokeless  tobacco: Never Used  . Tobacco comment: 4-5 cigarettes per day  Vaping Use  . Vaping Use: Never used  Substance and Sexual Activity  . Alcohol use: No    Alcohol/week: 0.0 standard  drinks  . Drug use: Not Currently    Types: Marijuana, "Crack" cocaine    Comment: hx marijuana usemany years ago, Crack -none since 11/2015  . Sexual activity: Not Currently    Partners: Male    Birth control/protection: Post-menopausal

## 2019-11-05 ENCOUNTER — Ambulatory Visit: Payer: Medicare Other | Attending: Orthopaedic Surgery | Admitting: Physical Therapy

## 2019-11-05 ENCOUNTER — Telehealth: Payer: Self-pay | Admitting: Physical Therapy

## 2019-11-05 NOTE — Telephone Encounter (Signed)
Called patient about her no-show appointment today at 8:00. Left a message. Earlie Counts, PT @10 /08/2019@ 8:39 AM

## 2019-11-08 ENCOUNTER — Ambulatory Visit (INDEPENDENT_AMBULATORY_CARE_PROVIDER_SITE_OTHER): Payer: Medicare Other | Admitting: Family Medicine

## 2019-11-08 ENCOUNTER — Other Ambulatory Visit: Payer: Self-pay

## 2019-11-08 ENCOUNTER — Ambulatory Visit: Payer: Medicare Other | Admitting: Adult Health

## 2019-11-08 ENCOUNTER — Encounter: Payer: Self-pay | Admitting: Adult Health

## 2019-11-08 ENCOUNTER — Other Ambulatory Visit: Payer: Self-pay | Admitting: Podiatry

## 2019-11-08 ENCOUNTER — Encounter: Payer: Self-pay | Admitting: Family Medicine

## 2019-11-08 VITALS — BP 146/80 | HR 92 | Ht 68.5 in | Wt 351.8 lb

## 2019-11-08 DIAGNOSIS — N819 Female genital prolapse, unspecified: Secondary | ICD-10-CM

## 2019-11-08 DIAGNOSIS — N8110 Cystocele, unspecified: Secondary | ICD-10-CM

## 2019-11-08 DIAGNOSIS — E1151 Type 2 diabetes mellitus with diabetic peripheral angiopathy without gangrene: Secondary | ICD-10-CM

## 2019-11-08 DIAGNOSIS — E1122 Type 2 diabetes mellitus with diabetic chronic kidney disease: Secondary | ICD-10-CM | POA: Diagnosis not present

## 2019-11-08 DIAGNOSIS — B353 Tinea pedis: Secondary | ICD-10-CM

## 2019-11-08 DIAGNOSIS — N183 Chronic kidney disease, stage 3 unspecified: Secondary | ICD-10-CM

## 2019-11-08 DIAGNOSIS — Z794 Long term (current) use of insulin: Secondary | ICD-10-CM | POA: Diagnosis not present

## 2019-11-08 LAB — POCT GLYCOSYLATED HEMOGLOBIN (HGB A1C): HbA1c, POC (controlled diabetic range): 9.3 % — AB (ref 0.0–7.0)

## 2019-11-08 MED ORDER — LANTUS SOLOSTAR 100 UNIT/ML ~~LOC~~ SOPN: 24.0000 [IU] | PEN_INJECTOR | Freq: Every day | SUBCUTANEOUS | 0 refills | Status: DC

## 2019-11-08 NOTE — Patient Instructions (Signed)
It was a pleasure to see you today.  I am sorry you are having these issues with your genital area.  I am going to refer you to gynecology.  Regarding your diabetes your hemoglobin A1c is back up and given that your lowest blood sugar that you have seen is around 200 I am going to increase your Lantus by 4 units to 24 units daily.  If you notice any low blood sugars less than 100 please let us know.  I happy have a wonderful afternoon!   Pelvic Organ Prolapse Pelvic organ prolapse is the stretching, bulging, or dropping of pelvic organs into an abnormal position. It happens when the muscles and tissues that surround and support pelvic structures become weak or stretched. Pelvic organ prolapse can involve the:  Vagina (vaginal prolapse).  Uterus (uterine prolapse).  Bladder (cystocele).  Rectum (rectocele).  Intestines (enterocele). When organs other than the vagina are involved, they often bulge into the vagina or protrude from the vagina, depending on how severe the prolapse is. What are the causes? This condition may be caused by:  Pregnancy, labor, and childbirth.  Past pelvic surgery.  Decreased production of the hormone estrogen associated with menopause.  Consistently lifting more than 50 lb (23 kg).  Obesity.  Long-term inability to pass stool (chronic constipation).  A cough that lasts a long time (chronic).  Buildup of fluid in the abdomen due to certain diseases and other conditions. What are the signs or symptoms? Symptoms of this condition include:  Passing a little urine (loss of bladder control) when you cough, sneeze, strain, and exercise (stress incontinence). This may be worse immediately after childbirth. It may gradually improve over time.  Feeling pressure in your pelvis or vagina. This pressure may increase when you cough or when you are passing stool.  A bulge that protrudes from the opening of your vagina.  Difficulty passing urine or stool.  Pain  in your lower back.  Pain, discomfort, or disinterest in sex.  Repeated bladder infections (urinary tract infections).  Difficulty inserting a tampon. In some people, this condition causes no symptoms. How is this diagnosed? This condition may be diagnosed based on a vaginal and rectal exam. During the exam, you may be asked to cough and strain while you are lying down, sitting, and standing up. Your health care provider will determine if other tests are required, such as bladder function tests. How is this treated? Treatment for this condition may depend on your symptoms. Treatment may include:  Lifestyle changes, such as changes to your diet.  Emptying your bladder at scheduled times (bladder training therapy). This can help reduce or avoid urinary incontinence.  Estrogen. Estrogen may help mild prolapse by increasing the strength and tone of pelvic floor muscles.  Kegel exercises. These may help mild cases of prolapse by strengthening and tightening the muscles of the pelvic floor.  A soft, flexible device that helps support the vaginal walls and keep pelvic organs in place (pessary). This is inserted into your vagina by your health care provider.  Surgery. This is often the only form of treatment for severe prolapse. Follow these instructions at home:  Avoid drinking beverages that contain caffeine or alcohol.  Increase your intake of high-fiber foods. This can help decrease constipation and straining during bowel movements.  Lose weight if recommended by your health care provider.  Wear a sanitary pad or adult diapers if you have urinary incontinence.  Avoid heavy lifting and straining with exercise and work.  Do not hold your breath when you perform mild to moderate lifting and exercise activities. Limit your activities as directed by your health care provider.  Do Kegel exercises as directed by your health care provider. To do this: ? Squeeze your pelvic floor muscles  tight. You should feel a tight lift in your rectal area and a tightness in your vaginal area. Keep your stomach, buttocks, and legs relaxed. ? Hold the muscles tight for up to 10 seconds. ? Relax your muscles. ? Repeat this exercise 50 times a day, or as many times as told by your health care provider. Continue to do this exercise for at least 4-6 weeks, or for as long as told by your health care provider.  Take over-the-counter and prescription medicines only as told by your health care provider.  If you have a pessary, take care of it as told by your health care provider.  Keep all follow-up visits as told by your health care provider. This is important. Contact a health care provider if you:  Have symptoms that interfere with your daily activities or sex life.  Need medicine to help with the discomfort.  Notice bleeding from your vagina that is not related to your period.  Have a fever.  Have pain or bleeding when you urinate.  Have bleeding when you pass stool.  Pass urine when you have sex.  Have chronic constipation.  Have a pessary that falls out.  Have bad smelling vaginal discharge.  Have an unusual, low pain in your abdomen. Summary  Pelvic organ prolapse is the stretching, bulging, or dropping of pelvic organs into an abnormal position. It happens when the muscles and tissues that surround and support pelvic structures become weak or stretched.  When organs other than the vagina are involved, they often bulge into the vagina or protrude from the vagina, depending on how severe the prolapse is.  In most cases, this condition needs to be treated only if it produces symptoms. Treatment may include lifestyle changes, estrogen, Kegel exercises, pessary insertion, or surgery.  Avoid heavy lifting and straining with exercise and work. Do not hold your breath when you perform mild to moderate lifting and exercise activities. Limit your activities as directed by your health  care provider. This information is not intended to replace advice given to you by your health care provider. Make sure you discuss any questions you have with your health care provider. Document Revised: 02/05/2017 Document Reviewed: 02/05/2017 Elsevier Patient Education  2020 [employer].

## 2019-11-08 NOTE — Telephone Encounter (Signed)
Please Advise

## 2019-11-08 NOTE — Progress Notes (Signed)
    SUBJECTIVE:   CHIEF COMPLAINT / HPI:   Vaginal concerns Patient reports that for the past week she has felt like she has something protruding from her vagina.  She also notes that when she urinates she used to have a good stream and sense she has pelvis protrusion she has noticed she cannot form a good stream of urine.  She reports she has palpated and felt something protruding from her vagina.  She reports she tried to use a sex toy and was unable to insert the sex toy due to the protruding mass.  She does not see a gynecologist and reports she has had 1 child and has not had any issues with this in the past.   OBJECTIVE:   BP (!) 146/80   Pulse 92   Ht 5' 8.5" (1.74 m)   Wt (!) 351 lb 12.8 oz (159.6 kg)   LMP 04/10/2006   SpO2 94%   BMI 52.71 kg/m   Physical Exam Vitals reviewed. Exam conducted with a chaperone present.  Constitutional:      General: She is not in acute distress.    Appearance: She is not ill-appearing or toxic-appearing.  HENT:     Head: Normocephalic and atraumatic.     Right Ear: Tympanic membrane, ear canal and external ear normal.     Left Ear: Tympanic membrane, ear canal and external ear normal.     Nose: Nose normal.     Mouth/Throat:     Mouth: Mucous membranes are moist.  Eyes:     General:        Right eye: No discharge.        Left eye: No discharge.     Extraocular Movements: Extraocular movements intact.     Conjunctiva/sclera: Conjunctivae normal.     Pupils: Pupils are equal, round, and reactive to light.  Pulmonary:     Effort: Pulmonary effort is normal.  Abdominal:     General: Abdomen is flat. There is no distension.     Palpations: Abdomen is soft.     Tenderness: There is no abdominal tenderness.  Genitourinary:    General: Normal vulva.     Exam position: Lithotomy position.     Pubic Area: No rash.      Comments: Patient has anterior prolapse noted on bimanual exam.  Top half of vacuum removed and anterior vagina  prolapses down when patient coughs.  No difficulty inserting speculum Neurological:     Mental Status: She is alert.      ASSESSMENT/PLAN:   Prolapse of anterior vaginal wall Patient reports 1 week history of feeling a mass protruding from her vagina which she reports is obstructing her urinary flow.  She tried to push it back and was unsuccessful.  Physical exam consistent with prolapse of the anterior vagina.  Referral placed for gynecology for further evaluation and management.     [name], MD [place]

## 2019-11-09 ENCOUNTER — Encounter: Payer: Self-pay | Admitting: Obstetrics and Gynecology

## 2019-11-10 ENCOUNTER — Ambulatory Visit (INDEPENDENT_AMBULATORY_CARE_PROVIDER_SITE_OTHER): Payer: Medicare Other | Admitting: Orthopaedic Surgery

## 2019-11-10 ENCOUNTER — Encounter: Payer: Self-pay | Admitting: Orthopaedic Surgery

## 2019-11-10 DIAGNOSIS — M75102 Unspecified rotator cuff tear or rupture of left shoulder, not specified as traumatic: Secondary | ICD-10-CM

## 2019-11-10 DIAGNOSIS — N8110 Cystocele, unspecified: Secondary | ICD-10-CM | POA: Insufficient documentation

## 2019-11-10 NOTE — Progress Notes (Signed)
Patient: [name]           Date of Birth: 11/12/1965           MRN: [id number] Visit Date: 11/10/2019 PCP: [name], MD   Assessment & Plan:  Chief Complaint:  Chief Complaint  Patient presents with  . Left Shoulder - Pain   Visit Diagnoses:  1. Nontraumatic tear of left supraspinatus tendon     Plan: Patient is 30-month status post left shoulder scope. She is doing well overall. She has a little bit of discomfort in the biceps region but she is fully functional with ADLs without any symptoms. She has full range of motion without pain. Surgical scars are fully healed. She has excellent strength. This point we will release her to activity as tolerated. Questions encouraged and answered. Follow-up as needed.  Follow-Up Instructions: Return if symptoms worsen or fail to improve.   Orders:  No orders of the defined types were placed in this encounter.  No orders of the defined types were placed in this encounter.   Imaging: No results found.  PMFS History: Patient Active Problem List   Diagnosis Date Noted  . Skin ulcer of left thigh, limited to breakdown of skin ([place]) 09/09/2019  . Ulnar nerve compression 08/06/2019  . Diabetic neuropathy associated with type 2 diabetes mellitus ([place]) 08/06/2019  . Body mass index 50.0-59.9, adult ([place]) 06/12/2019  . Impingement syndrome of left shoulder 05/07/2019  . Polyp of colon   . Polyp of ascending colon   . Rectal discomfort 02/12/2019  . Osteoarthritis of right hip 02/12/2019  . Nontraumatic tear of left supraspinatus tendon 12/30/2018  . Gout 12/17/2018  . Nontraumatic complete tear of right rotator cuff 12/08/2018  . Rotator cuff tear, right 12/01/2018  . Right groin pain 07/20/2018  . Depressed mood 07/13/2018  . Paroxysmal SVT (supraventricular tachycardia) ([place]) 03/20/2018  . Pure hypercholesterolemia 03/20/2018  . Muscle cramping 02/25/2018  . Nodule of upper lobe of right lung 02/13/2018  . Falls,  subsequent encounter 01/30/2018  . Pain in left foot 11/04/2017  . Essential hypertension 10/27/2017  . Solitary pulmonary nodule 10/07/2017  . Restrictive lung disease secondary to obesity 10/01/2017  . Polycythemia, secondary 10/01/2017  . Chronic viral hepatitis B without delta-agent ([place]) 07/08/2017  . COPD (chronic obstructive pulmonary disease) with chronic bronchitis ([place]) 06/27/2017  . Chronic kidney disease (CKD), stage IV (severe) ([place]) 01/14/2017  . Type 2 diabetes mellitus with stage 3 chronic kidney disease, with long-term current use of insulin ([place]) 12/12/2016  . Urge incontinence of urine 12/05/2016  . Trigger finger, left index finger 07/15/2016  . Back pain 04/07/2014  . Morbid obesity ([place]) 10/02/2012  . Chronic diastolic CHF (congestive heart failure) ([place]) 04/07/2012  . GERD 01/26/2010  . Obstructive sleep apnea treated with BiPAP 02/03/2008  . FIBROCYSTIC BREAST DISEASE 10/28/2006  . Hyperlipidemia 03/27/2006  . Obesity hypoventilation syndrome ([place]) 03/27/2006  . Former tobacco use 03/27/2006  . Depression with anxiety 03/27/2006   Past Medical History:  Diagnosis Date  . Alcoholism ([place])   . Anxiety   . Arthritis 04-10-11   hips, shoulders, back  . Asthma   . Bipolar disorder ([place])   . Cardiomegaly   . Carpal tunnel syndrome, bilateral   . Cervical cancer ([place]) 1993   cervical, no treatment done, went away per pt  . Cervical dysplasia or atypia 04-10-11   '93- once dx.-got pregnant-no intervention, then postpartum, no dysplasia found  . CHF (  congestive heart failure) ([place])    no cardiologist 2014 dx, none now  . Chronic hypoxemic respiratory failure ([place])   . Chronic kidney disease    stage 3 kidney disease  . 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 Pathology showed Condyloma, gluteal CONDYLOMA ACUMINATUM.   [name] Kitchen COPD (chronic obstructive pulmonary disease) ([place])   . Depression   . Diabetes mellitus without complication ([place])    . Dyspnea    with actity, sitting  . Fall 12/16/2016  . Fibrocystic breast disease   . GERD (gastroesophageal reflux disease)   . Grade I diastolic dysfunction 83/15/1761   Noted on ECHO  . Hepatitis    hep B-count is low at present,doesn't register  . History of PSVT (paroxysmal supraventricular tachycardia)   . Hypercalcemia   . Hyperlipidemia   . Hypertension   . Incomplete left bundle branch block (LBBB) 09/24/2018   Noted on EKG  . Lung nodule 11/2017   right and left lung  . Migraine headache    none recent  . Morbid obesity ([place]) 03/27/2006  . Neuropathy   . Pneumonia    walking pneu 15 yrs. ago  . Skin lesion 03/15/2011   In gluteal crease now s/p removal by Dr. [name] of General Surgery on 3/12. Path shows condyloma.     . Sleep apnea 04-10-11   uses cpap, pt does not know settings  . Trigger finger    left third  . Urge incontinence of urine     Family History  Problem Relation Age of Onset  . Pulmonary embolism Mother   . Stroke Father   . Alcohol abuse Father   . Pneumonia Father   . Hypertension Father   . Breast cancer Maternal Aunt   . Hypertension Other   . Diabetes Other        Aunts and cousins  . Asthma Sister   . Depression Sister   . Arthritis Sister   . Sickle cell anemia Son     Past Surgical History:  Procedure Laterality Date  . ANAL FISTULECTOMY  04/17/2011   Procedure: FISTULECTOMY ANAL;  Surgeon: Imogene Burn. [name], MD;  Location: [facility];  Service: General;  Laterality: N/A;  Excision of Condyloma Gluteal Cleft   . ANAL RECTAL MANOMETRY N/A 03/26/2019   Procedure: ANO RECTAL MANOMETRY;  Surgeon: Thornton Park, MD;  Location: [facility];  Service: Gastroenterology;  Laterality: N/A;  . BIOPSY  03/09/2019   Procedure: BIOPSY;  Surgeon: Thornton Park, MD;  [facility];  Service: Gastroenterology;;  . Lum [place] INJECTION N/A 12/29/2018   Procedure: BOTOX INJECTION(100 UNITS)  WITH CYSTOSCOPY;  Surgeon: Ceasar Mons,  MD;  Location: [facility];  Service: Urology;  Laterality: N/A;  . BOTOX INJECTION N/A 07/27/2019   Procedure: BOTOX INJECTION WITH CYSTOSCOPY;  Surgeon: Ceasar Mons, MD;  Location: [facility];  Service: Urology;  Laterality: N/A;  ONLY NEEDS 30 MIN  . BREAST CYST EXCISION     bilateral breast, 3 cysts removed from each breast  . BREAST EXCISIONAL BIOPSY Right    x 3  . BREAST EXCISIONAL BIOPSY Left    x 3  . CARPAL TUNNEL RELEASE Bilateral   . Cervical biospy    . CERVICAL FUSION  04-10-11   x3- cervical fusion with plating and screws-Dr. [name]  . COLONOSCOPY WITH PROPOFOL N/A 07/06/2015   Procedure: COLONOSCOPY WITH PROPOFOL;  Surgeon: [name], MD;  [facility];  Service: Endoscopy;  Laterality: N/A;  .  COLONOSCOPY WITH PROPOFOL N/A 03/09/2019   Procedure: COLONOSCOPY WITH PROPOFOL;  Surgeon: Thornton Park, MD;  [facility];  Service: Gastroenterology;  Laterality: N/A;  . DOPPLER ECHOCARDIOGRAPHY  04/06/2012   AT [facility] 55-60%  . FOOT SURGERY Left 2018  . FRACTURE SURGERY     little toe left foot  . I & D EXTREMITY Left 12/18/2016   Procedure: IRRIGATION AND DEBRIDEMENT LEFT FOOT, CLOSURE;  Surgeon: [name], MD;  Location: [facility];  Service: Orthopedics;  Laterality: Left;  . [place] SURGERY  04-10-11   x5-Lumbar fusion-retained hardware.(Dr. Louanne Skye)  . MULTIPLE TOOTH EXTRACTIONS    . POLYPECTOMY  03/09/2019   Procedure: POLYPECTOMY;  Surgeon: Thornton Park, MD;  [facility];  Service: Gastroenterology;;  . SHOULDER ARTHROSCOPY WITH ROTATOR CUFF REPAIR AND SUBACROMIAL DECOMPRESSION Left 05/07/2019   Procedure: LEFT SHOULDER ARTHROSCOPY WITH DEBRIDEMENT, DISTAL CLAVICLE EXCISION,  SUBACROMIAL DECOMPRESSION AND POSSIBLE ROTATOR CUFF REPAIR;  Surgeon: [name], MD;  Location: [facility];  Service: Orthopedics;  Laterality: Left;  . TEE WITHOUT CARDIOVERSION N/A 04/06/2012   Procedure: TRANSESOPHAGEAL ECHOCARDIOGRAM (TEE);   Surgeon: [name], MD;  Location[facility];  Service: Cardiovascular;  Laterality: N/A;  . TRIGGER FINGER RELEASE Right    middle finger  . TRIGGER FINGER RELEASE Left 06/28/2016   Procedure: RELEASE TRIGGER FINGER LEFT 3RD FINGER;  Surgeon: [name], MD;  Location: [facility];  Service: Orthopedics;  Laterality: Left;  . TRIGGER FINGER RELEASE Right 06/12/2016   Procedure: RIGHT INDEX FINGER TRIGGER RELEASE;  Surgeon: [name], MD;  Location: [facility];  Service: Orthopedics;  Laterality: Right;  . TRIGGER FINGER RELEASE Right 01/19/2018   Procedure: RIGHT RING FINGER TRIGGER FINGER RELEASE;  Surgeon: [name], MD;  Location: [facility];  Service: Orthopedics;  Laterality: Right;  . TRIGGER FINGER RELEASE Left 05/07/2019   Procedure: RELEASE TRIGGER FINGER LEFT INDEX FINGER;  Surgeon: [name], MD;  Location: [facility];  Service: Orthopedics;  Laterality: Left;   Social History   Occupational History  . Occupation: disabled    Comment: Disabled  Tobacco Use  . Smoking status: Former Smoker    Packs/day: 0.20    Years: 40.00    Pack years: 8.00    Types: Cigarettes    Start date: 01/28/1977    Quit date: 07/25/2019    Years since quitting: 0.2  . Smokeless tobacco: Never Used  . Tobacco comment: 4-5 cigarettes per day  Vaping Use  . Vaping Use: Never used  Substance and Sexual Activity  . Alcohol use: No    Alcohol/week: 0.0 standard drinks  . Drug use: Not Currently    Types: Marijuana, "Crack" cocaine    Comment: hx marijuana usemany years ago, Crack -none since 11/2015  . Sexual activity: Not Currently    Partners: Male    Birth control/protection: Post-menopausal

## 2019-11-10 NOTE — Assessment & Plan Note (Signed)
Patient reports 1 week history of feeling a mass protruding from her vagina which she reports is obstructing her urinary flow.  She tried to push it back and was unsuccessful.  Physical exam consistent with prolapse of the anterior vagina.  Referral placed for gynecology for further evaluation and management.

## 2019-11-11 ENCOUNTER — Encounter: Payer: Self-pay | Admitting: Pharmacist

## 2019-11-11 ENCOUNTER — Other Ambulatory Visit: Payer: Self-pay

## 2019-11-11 ENCOUNTER — Ambulatory Visit (INDEPENDENT_AMBULATORY_CARE_PROVIDER_SITE_OTHER): Payer: Medicare Other | Admitting: Pharmacist

## 2019-11-11 DIAGNOSIS — E1122 Type 2 diabetes mellitus with diabetic chronic kidney disease: Secondary | ICD-10-CM | POA: Diagnosis not present

## 2019-11-11 DIAGNOSIS — Z720 Tobacco use: Secondary | ICD-10-CM

## 2019-11-11 DIAGNOSIS — N183 Chronic kidney disease, stage 3 unspecified: Secondary | ICD-10-CM | POA: Diagnosis not present

## 2019-11-11 DIAGNOSIS — Z794 Long term (current) use of insulin: Secondary | ICD-10-CM

## 2019-11-11 DIAGNOSIS — Z87891 Personal history of nicotine dependence: Secondary | ICD-10-CM

## 2019-11-11 MED ORDER — LANTUS SOLOSTAR 100 UNIT/ML ~~LOC~~ SOPN: 20.0000 [IU] | PEN_INJECTOR | Freq: Every day | SUBCUTANEOUS | Status: DC

## 2019-11-11 MED ORDER — NICOTINE POLACRILEX 4 MG MT GUM: 4.0000 mg | CHEWING_GUM | OROMUCOSAL | 2 refills | Status: DC | PRN

## 2019-11-11 MED ORDER — OZEMPIC (0.25 OR 0.5 MG/DOSE) 2 MG/1.5ML ~~LOC~~ SOPN: 0.2500 mg | PEN_INJECTOR | SUBCUTANEOUS | 0 refills | Status: DC

## 2019-11-11 MED ORDER — VARENICLINE TARTRATE 1 MG PO TABS: 1.0000 mg | ORAL_TABLET | Freq: Two times a day (BID) | ORAL | 3 refills | Status: DC

## 2019-11-11 NOTE — Progress Notes (Signed)
Reviewed: I agree with Dr. Koval's documentation and management. 

## 2019-11-11 NOTE — Assessment & Plan Note (Signed)
Diabetes uncontrolled with current A1c 9.3 (11/08/2019). She has good medication adherence and reports a strict regimen. Currently, her blood glucose control is suboptimal as an adverse effect of corticosteroid injections. -Decreased dose of basal insulin Lantus (insulin glargine) from 24 units every morning to 20 units every morning -Continued rapid insulin Humalog (insulin lispro) 8 units three times daily -Restarted GLP-1 Ozempic (semaglutide) at 0.25mg  MINUS two clicks once weekly.  -Continued SGLT2-I Jardiance (empagliflozin) 25mg  once daily -Extensively discussed dietary effects on blood sugar control

## 2019-11-11 NOTE — Patient Instructions (Addendum)
Great to see you today.   Decrease your Lantus from 24 to 20 units once daily.   Restart Ozempic same as previous dose.    Restart nicotine gum and Chantix when available.   Next visit on November 4th at 8:30 AM

## 2019-11-11 NOTE — Progress Notes (Signed)
S:     Chief Complaint  Patient presents with  . Medication Management    Diabetes Management    Patient arrives in good spirits. Presents for diabetes evaluation, education, and management. Patient was referred and last seen by Primary Care Provider on 11/08/19 in which Lantus (insulin glargine) was increased from 20 units to 24 units daily. She checks her blood sugar in the morning and before every meal, and reports values typically ranging between 200-300. She had one value of 413 on 10/8 which she attributed to the corticosteroid injection she received on 10/7. Her steroid injections often cause her blood sugar to remain high for a month to a month and a half. Unfortunately, until she is able to lose enough weight to have a hip replacement she will continue steroid injections for pain. She is interested in restarting Ozempic (semaglutide) because it helped with her weight loss and blood glucose control significantly in the past. She also started smoking again and reports smoking approximately 1 pack per day.  Insurance coverage/medication affordability: [employer]   Current diabetes medications include: Lantus 24 units daily, Jardiance 25 mg daily, Humalog 8 units TID Current hypertension medications include: Losartan 100 mg daily, Diltiazem XR 120 mg BID, Lasix 80 mg BID, metolazone 2.5 mg on MThus Current hyperlipidemia medications include: Atorvastatin 40 mg daily, Fish oil 1000 mg daily   Patient reports neuropathy (nerve pain). Patient denies visual changes.    O:  Physical Exam Constitutional:      Appearance: Normal appearance. She is obese.  Pulmonary:     Effort: Pulmonary effort is normal.  Neurological:     Mental Status: She is alert.  Psychiatric:        Mood and Affect: Mood normal.    Review of Systems  Respiratory: Positive for shortness of breath.   All other systems reviewed and are negative.   Lab Results  Component Value Date   HGBA1C 9.3 (A) 11/08/2019     There were no vitals filed for this visit.  Lipid Panel     Component Value Date/Time   CHOL 146 09/24/2018 2336   CHOL 145 10/27/2017 0944   TRIG 165 (H) 09/24/2018 2336   HDL 37 (L) 09/24/2018 2336   HDL 55 10/27/2017 0944   CHOLHDL 3.9 09/24/2018 2336   VLDL 33 09/24/2018 2336   LDLCALC 76 09/24/2018 2336   LDLCALC 70 10/27/2017 0944   LDLDIRECT 114 (H) 09/15/2012 1559   Patient-reported blood sugars: Typically in the 200s-300s. On Friday 10/8 she had the highest reading she has seen of 413. This was after she had a corticosteroid shot on 10/7. She hasn't had any lows <100, and before she was getting steroid injections her blood sugars would be in the 100s.  Patient denies hypoglycemic events.  Clinical Atherosclerotic Cardiovascular Disease (ASCVD): No  The 10-year ASCVD risk score Mikey Bussing DC Jr., et al., 2013) is: 24.1%   Values used to calculate the score:     Age: 54 years     Sex: Female     Is Non-Hispanic African American: Yes     Diabetic: Yes     Tobacco smoker: Yes     Systolic Blood Pressure: 557 mmHg     Is BP treated: Yes     HDL Cholesterol: 37 mg/dL     Total Cholesterol: 146 mg/dL    A/P: Diabetes uncontrolled with current A1c 9.3 (11/08/2019). She has good medication adherence and reports a strict regimen. Currently, her  blood glucose control is suboptimal as an adverse effect of corticosteroid injections. -Decreased dose of basal insulin Lantus (insulin glargine) from 24 units every morning to 20 units every morning -Continued rapid insulin Humalog (insulin lispro) 8 units three times daily -Restarted GLP-1 Ozempic (semaglutide) at 0.25mg  MINUS two clicks once weekly.  -Continued SGLT2-I Jardiance (empagliflozin) 25mg  once daily -Extensively discussed dietary effects on blood sugar control -Counseled on s/sx of and management of hypoglycemia -Next A1C anticipated 01/2019.  -Continued atorvastatin 40 mg.   History of Chronic tobacco abuse - recently  restarted and reports smoking 1 pack per day.  She is interested in restarting previously effective regimen of Varenicline and nicotine gum.  Agree to restart both.  Supply may be limited for varenicline due to recent recall.  Patient aware of potential delay in supply.  -Restarted varenicline 1mg  tablet twice daily -Restarted Nicorette 4mg  chewing gum  Written patient instructions provided.  Total time in face to face counseling 40 minutes.    Follow up Pharmacist Clinic Visit in 3 weeks on 11/4. Patient seen with Mercy Riding, PharmD, PGY1 Pharmacy Resident and Lorel Monaco, PharmD, PGY2 Pharmacy Resident.

## 2019-11-11 NOTE — Assessment & Plan Note (Signed)
History of Chronic tobacco abuse - recently restarted and reports smoking 1 pack per day.  She is interested in restarting previously effective regimen of Varenicline and nicotine gum.  Agree to restart both.  Supply may be limited for varenicline due to recent recall.  Patient aware of potential delay in supply.  -Restarted varenicline 1mg  tablet twice daily -Restarted Nicorette 4mg  chewing gum

## 2019-11-12 DIAGNOSIS — G4733 Obstructive sleep apnea (adult) (pediatric): Secondary | ICD-10-CM | POA: Diagnosis not present

## 2019-11-12 DIAGNOSIS — M5090 Cervical disc disorder, unspecified, unspecified cervical region: Secondary | ICD-10-CM | POA: Diagnosis not present

## 2019-11-15 ENCOUNTER — Ambulatory Visit: Payer: Medicare Other | Admitting: Adult Health

## 2019-11-15 ENCOUNTER — Encounter: Payer: Self-pay | Admitting: Adult Health

## 2019-11-15 DIAGNOSIS — G4733 Obstructive sleep apnea (adult) (pediatric): Secondary | ICD-10-CM

## 2019-11-16 ENCOUNTER — Other Ambulatory Visit: Payer: Self-pay

## 2019-11-16 ENCOUNTER — Encounter: Payer: Medicare Other | Attending: General Surgery | Admitting: Skilled Nursing Facility1

## 2019-11-16 ENCOUNTER — Ambulatory Visit: Payer: Medicare Other | Admitting: Obstetrics and Gynecology

## 2019-11-16 DIAGNOSIS — N184 Chronic kidney disease, stage 4 (severe): Secondary | ICD-10-CM | POA: Diagnosis not present

## 2019-11-16 NOTE — Telephone Encounter (Signed)
Order for cpap supplies sent to [facility] via community message. Confirmation received that the order transmitted was successful.  

## 2019-11-16 NOTE — Progress Notes (Signed)
Medical Nutrition Therapy  Patient was seen on 09/16/2019 for Pre-Operative Nutrition Assessment. Letter of approval faxed to [facility] bariatric surgery program coordinator on 09/16/2019  Referral stated Supervised Weight Loss (SWL) visits needed: pt states [facility] told her she needs 1 more after today (10/21/2019)  Planned surgery: Sleeve Gastrectomy  Pt expectation of surgery: to lose weight Pt expectation of dietitian: to guide  Nutrition Clearance: Pt seems prepared for surgery and understands the changes neccessary to be successful as represented by the changes she has already accomplished    NUTRITION ASSESSMENT   Anthropometrics  Start weight at [facility]: 405.6 lbs (date: 01/15/2018)  Wt today: 341.9 pounds Height: 68.5 in BMI: 50.49 kg/m2     Clinical  Medical hx: CHF, OSA, COPD, GERD, Diabetes, CKD, hyperlipidemia, GOUT Medications: fast and long acting insulin  Labs: WBC 11.4, RBC 6.05, Hemoglobin 16.6, HCT 49,  K 3.2, BUN 32, Creatinine 1.48, GFR 46, A1C 9.3 Notable signs/symptoms:  Any previous deficiencies? No  Micronutrient Nutrition Focused Physical Exam: Hair: No issues observed Eyes: No issues observed Mouth: No issues observed Neck: No issues observed Nails: No issues observed Skin: No issues observed  Body Composition Scale 10/21/2019  Current Body Weight 351.7  Total Body Fat % 50.4  Visceral Fat 23  Fat-Free Mass % 49.5   Total Body Water % 39.2  Muscle-Mass lbs 35.8  BMI 50.6  Body Fat Displacement          Torso  lbs 110.3         Left Leg  lbs 22         Right Leg  lbs 22         Left Arm  lbs 11         Right Arm   lbs 11      Lifestyle & Dietary Hx  Pt states she has not had reflux since making dietary changes (has been taking omeprazole as well).  Pt states her goal for herself was 346 and is so excited she surpassed it! Pt states she attributes her determination and mindset and the education she has received for her  sucess. Pt states her blood sugars have been around the 150, 123 Pt states she knows she will stick with appropriate portion size, switching to sugar free natures twist  Pt states she is confident there will be no struggles with change after surgery; stating she loves her airfryer and pressure cooker.    24-Hr Dietary Recall First Meal: fruit + protein shake Snack: fruit Second Meal: fish + caulfilower + french fries or turley sandwich Snack: [place] sausage stick or peanut butter crackers Third Meal:  fish + caulfilower + french fries or [place] sandwich + pickles or cabbage + oxe tails with fat cut off Snack: fruit or honey graham crackers + peanut butter Beverages: slim fast, powerade zero, diet gingerale, water + flavoring    Estimated Energy Needs Calories: 1600  NUTRITION DIAGNOSIS  Overweight/obesity ([place]-3.3) related to past poor dietary habits and physical inactivity as evidenced by patient w/ planned Sleeve Gastrectomy surgery following dietary guidelines for continued weight loss.    NUTRITION INTERVENTION  Nutrition counseling (C-1) and education (E-2) to facilitate bariatric surgery goals.   Pre-Op Goals Reviewed with the Patient -Keep up the great work!   Handouts Provided Include in the past . Bariatric Surgery handouts (Nutrition Visits, Pre-Op Goals, Protein Shakes, Vitamins & Minerals)  Learning Style & Readiness for Change Teaching method utilized: Visual & Auditory  Demonstrated degree of understanding via: Teach Back  Barriers to learning/adherence to lifestyle change: none identified      MONITORING & EVALUATION Dietary intake, weekly physical activity, body weight, and pre-op goals reached at next nutrition visit.    Next Steps  Patient is to follow up at [facility] for Pre-Op Class >2 weeks before surgery for further nutrition education.

## 2019-11-17 DIAGNOSIS — M5136 Other intervertebral disc degeneration, lumbar region: Secondary | ICD-10-CM | POA: Diagnosis not present

## 2019-11-17 DIAGNOSIS — G894 Chronic pain syndrome: Secondary | ICD-10-CM | POA: Diagnosis not present

## 2019-11-17 DIAGNOSIS — Z79899 Other long term (current) drug therapy: Secondary | ICD-10-CM | POA: Diagnosis not present

## 2019-11-17 DIAGNOSIS — M5030 Other cervical disc degeneration, unspecified cervical region: Secondary | ICD-10-CM | POA: Diagnosis not present

## 2019-11-17 DIAGNOSIS — J449 Chronic obstructive pulmonary disease, unspecified: Secondary | ICD-10-CM | POA: Diagnosis not present

## 2019-11-17 DIAGNOSIS — G4733 Obstructive sleep apnea (adult) (pediatric): Secondary | ICD-10-CM | POA: Diagnosis not present

## 2019-11-18 DIAGNOSIS — E790 Hyperuricemia without signs of inflammatory arthritis and tophaceous disease: Secondary | ICD-10-CM | POA: Diagnosis not present

## 2019-11-18 DIAGNOSIS — Z79899 Other long term (current) drug therapy: Secondary | ICD-10-CM | POA: Diagnosis not present

## 2019-11-19 ENCOUNTER — Encounter: Payer: Self-pay | Admitting: Family Medicine

## 2019-11-22 NOTE — Telephone Encounter (Signed)
Called patient to discuss medication changes.  We discussed increasing her dose of gabapentin versus transitioning to Cymbalta.  I scheduled a visit with her on 11/8 to further discuss this and possibly make medication changes.  Patient is agreeable to this.

## 2019-11-24 ENCOUNTER — Ambulatory Visit: Payer: Medicare Other | Admitting: Obstetrics and Gynecology

## 2019-11-25 ENCOUNTER — Telehealth: Payer: Self-pay | Admitting: Pharmacist

## 2019-11-25 DIAGNOSIS — Z720 Tobacco use: Secondary | ICD-10-CM

## 2019-11-25 NOTE — Telephone Encounter (Signed)
Patient contacted for tobacco reduction and cessation plan.   Patient reports the varenicline tablets are working well.  She has decreased her intake from 20 to 10 cigs per day.  She has a plan to quit in 3 days (on Halloween).   She is NOT using nicotine gum because it now costs her $33 per prescription (Previoulsy less).   She verbalized goal of quitting completely in 3 days.   She has a follow-up face-to-face Rx clinic visit with me in 1 week.  Reassess at that time.

## 2019-11-25 NOTE — Telephone Encounter (Signed)
Noted and agree. 

## 2019-11-25 NOTE — Assessment & Plan Note (Signed)
Patient reports the varenicline tablets are working well.  She has decreased her intake from 20 to 10 cigs per day.  She has a plan to quit in 3 days (on Halloween).   She is NOT using nicotine gum because it now costs her $33 per prescription (Previoulsy less).   She verbalized goal of quitting completely in 3 days.   She has a follow-up face-to-face Rx clinic visit with me in 1 week.  Reassess at that time.

## 2019-11-27 ENCOUNTER — Other Ambulatory Visit: Payer: Self-pay | Admitting: Family Medicine

## 2019-11-27 DIAGNOSIS — K146 Glossodynia: Secondary | ICD-10-CM

## 2019-11-28 ENCOUNTER — Other Ambulatory Visit: Payer: Self-pay | Admitting: Family Medicine

## 2019-12-02 ENCOUNTER — Ambulatory Visit: Payer: Medicare Other | Admitting: Pharmacist

## 2019-12-05 DIAGNOSIS — M5090 Cervical disc disorder, unspecified, unspecified cervical region: Secondary | ICD-10-CM | POA: Diagnosis not present

## 2019-12-05 DIAGNOSIS — J9621 Acute and chronic respiratory failure with hypoxia: Secondary | ICD-10-CM | POA: Diagnosis not present

## 2019-12-05 DIAGNOSIS — G4733 Obstructive sleep apnea (adult) (pediatric): Secondary | ICD-10-CM | POA: Diagnosis not present

## 2019-12-06 ENCOUNTER — Other Ambulatory Visit: Payer: Self-pay

## 2019-12-06 ENCOUNTER — Ambulatory Visit (INDEPENDENT_AMBULATORY_CARE_PROVIDER_SITE_OTHER): Payer: Medicare Other | Admitting: Family Medicine

## 2019-12-06 ENCOUNTER — Encounter: Payer: Self-pay | Admitting: Family Medicine

## 2019-12-06 ENCOUNTER — Ambulatory Visit: Payer: Medicare Other | Admitting: Pharmacist

## 2019-12-06 ENCOUNTER — Telehealth: Payer: Self-pay | Admitting: Pharmacist

## 2019-12-06 DIAGNOSIS — Z720 Tobacco use: Secondary | ICD-10-CM

## 2019-12-06 DIAGNOSIS — F418 Other specified anxiety disorders: Secondary | ICD-10-CM | POA: Diagnosis not present

## 2019-12-06 MED ORDER — DULOXETINE HCL 30 MG PO CPEP: 60.0000 mg | ORAL_CAPSULE | Freq: Every day | ORAL | 0 refills | Status: DC

## 2019-12-06 NOTE — Telephone Encounter (Signed)
Noted and agree. 

## 2019-12-06 NOTE — Assessment & Plan Note (Signed)
Discussed depression with patient and she reports that her depression is doing very well at this time but she is interested in a medication that can help with depression as well as her chronic pain issues.  We discussed the options of continuing her Zoloft versus switching to Cymbalta and patient would like to try the Cymbalta.  Denies SI or HI and denies any signs or symptoms of depression today. -We will taper sertraline from 100 mg daily to 50 mg daily and then transition to 30 mg Cymbalta for 4 days and then she can increase to 60 mg daily -PHQ-9 at every visit -Follow-up symptoms in 1 month, scheduled for December 7 at 1:55 PM

## 2019-12-06 NOTE — Progress Notes (Signed)
    SUBJECTIVE:   CHIEF COMPLAINT / HPI:   Chronic foot pain Patient presents for chronic lower extremity pain.  This is a neuropathic pain which she takes Neurontin 600 mg 3 times daily.  She understands there is room to move up on this but it makes her groggy and we have discussed at previous visits that she could come off of her antidepressant Zoloft and switch to something that helps with chronic pain.  She is interested in doing this at this time.  Denies any SI or HI and reports that her depression is doing wonderfully.  Smoking cessation Patient reports she is down to 2 cigarettes and is doing well on Chantix.  Has a quit date for this weekend and she is working with our pharmacy team to quit smoking.  OBJECTIVE:   BP 118/74   Pulse (!) 101   Ht 5\' 9"  (1.753 m)   Wt (!) 341 lb 6.4 oz (154.9 kg)   LMP 04/10/2006   SpO2 96%   BMI 50.42 kg/m   General: Well-appearing obese 54 year old female in good spirits Respiratory: Normal work of breathing, lungs clear to auscultation bilaterally Cardiac: Regular rate and rhythm, no murmurs appreciated Extremities: Patient has palpable pulses in lower extremities bilaterally.  Good sensation but reports tingling and pins-and-needles consistent with diabetic neuropathy  ASSESSMENT/PLAN:   Depression with anxiety Discussed depression with patient and she reports that her depression is doing very well at this time but she is interested in a medication that can help with depression as well as her chronic pain issues.  We discussed the options of continuing her Zoloft versus switching to Cymbalta and patient would like to try the Cymbalta.  Denies SI or HI and denies any signs or symptoms of depression today. -We will taper sertraline from 100 mg daily to 50 mg daily and then transition to 30 mg Cymbalta for 4 days and then she can increase to 60 mg daily -PHQ-9 at every visit -Follow-up symptoms in 1 month, scheduled for December 7 at 1:55  PM  Tobacco abuse Patient currently smoking 2 cigarettes daily.  She is going to decrease to 1 and then plans to stop smoking this weekend.  She is currently on varenicline 1 mg twice daily which she reports is helping. -Continue to encourage smoking cessation -We will follow-up at next visit -[facility] team's help with this process     [name], MD [place]

## 2019-12-06 NOTE — Telephone Encounter (Signed)
Patient who was schedule to 8:30 AM appointment called.  She stated she was concerned that her insurance would not cover two visits in the same day.    She also has appointment this afternoon with PCP, Dr. Caron Presume.   She reports smoking is down to 2 cigarettes per day.  She was pleased with her progress in reducing down from 1 PPD.  She continues on the varenicline 1mg  BID. She feels this is helping her significantly.  Her quit plan includes stopping smoking within the next 5 days.    She also reports her blood sugars are improved and she continues on Ozempic (semaglutide) at this time.   I shared that I would plan to follow-up by phone in 1 week to assess progress with tobacco cessation.  I asked her to create a face-to-face follow-up plan with Dr. Caron Presume at today's visit.   I am happy to see her in person within the next few weeks but I can also follow-up by phone if that is more appropriate.   Patient verbalized understanding of plan.  She was happy with her progress.

## 2019-12-06 NOTE — Assessment & Plan Note (Addendum)
Patient currently smoking 2 cigarettes daily.  She is going to decrease to 1 and then plans to stop smoking this weekend.  She is currently on varenicline 1 mg twice daily which she reports is helping. -Continue to encourage smoking cessation -We will follow-up at next visit -[facility] team's help with this process

## 2019-12-06 NOTE — Patient Instructions (Addendum)
It was wonderful to see you today.  Congratulations on the weight loss and the progress on quitting smoking.  I know your quit date is the end of this week.  Keep up the great work!  We are going to switch your depression medication to a medication that is good for depression as well as chronic pain.  I would like for you to take half of your Zoloft pill daily for 4 days and then discontinue that medication.  After you stopped the Zoloft you can immediately start taking 30 mg of Cymbalta for 4 days and then increase to 60 mg (2 pills).  I would like to see you in 1 month to assess how this is helping her symptoms.  We have scheduled you for an appointment on December 7 at 1:55 PM.  If you have any questions, concerns, issues please feel free to call the clinic.  I hope you have a wonderful afternoon!  Duloxetine delayed-release capsules What is this medicine? DULOXETINE (doo LOX e teen) is used to treat depression, anxiety, and different types of chronic pain. This medicine may be used for other purposes; ask your health care provider or pharmacist if you have questions. COMMON BRAND NAME(S): Cymbalta, Creig Hines, Irenka What should I tell my health care provider before I take this medicine? They need to know if you have any of these conditions:  bipolar disorder  glaucoma  high blood pressure  kidney disease  liver disease  seizures  suicidal thoughts, plans or attempt; a previous suicide attempt by you or a family member  take medicines that treat or prevent blood clots  taken medicines called MAOIs like Carbex, Eldepryl, Marplan, Nardil, and Parnate within 14 days  trouble passing urine  an unusual reaction to duloxetine, other medicines, foods, dyes, or preservatives  pregnant or trying to get pregnant  breast-feeding How should I use this medicine? Take this medicine by mouth with a glass of water. Follow the directions on the prescription label. Do not crush, cut or chew some  capsules of this medicine. Some capsules may be opened and sprinkled on applesauce. Check with your doctor or pharmacist if you are not sure. You can take this medicine with or without food. Take your medicine at regular intervals. Do not take your medicine more often than directed. Do not stop taking this medicine suddenly except upon the advice of your doctor. Stopping this medicine too quickly may cause serious side effects or your condition may worsen. A special MedGuide will be given to you by the pharmacist with each prescription and refill. Be sure to read this information carefully each time. Talk to your pediatrician regarding the use of this medicine in children. While this drug may be prescribed for children as young as 33 years of age for selected conditions, precautions do apply. Overdosage: If you think you have taken too much of this medicine contact a poison control center or emergency room at once. NOTE: This medicine is only for you. Do not share this medicine with others. What if I miss a dose? If you miss a dose, take it as soon as you can. If it is almost time for your next dose, take only that dose. Do not take double or extra doses. What may interact with this medicine? Do not take this medicine with any of the following medications:  desvenlafaxine  levomilnacipran  linezolid  MAOIs like Carbex, Eldepryl, Marplan, Nardil, and Parnate  methylene blue (injected into a vein)  milnacipran  thioridazine  venlafaxine This medicine may also interact with the following medications:  alcohol  amphetamines  aspirin and aspirin-like medicines  certain antibiotics like ciprofloxacin and enoxacin  certain medicines for blood pressure, heart disease, irregular heart beat  certain medicines for depression, anxiety, or psychotic disturbances  certain medicines for migraine headache like almotriptan, eletriptan, frovatriptan, naratriptan, rizatriptan, sumatriptan,  zolmitriptan  certain medicines that treat or prevent blood clots like warfarin, enoxaparin, and dalteparin  cimetidine  fentanyl  lithium  NSAIDS, medicines for pain and inflammation, like ibuprofen or naproxen  phentermine  procarbazine  rasagiline  sibutramine  [place]'s wort  theophylline  tramadol  tryptophan This list may not describe all possible interactions. Give your health care provider a list of all the medicines, herbs, non-prescription drugs, or dietary supplements you use. Also tell them if you smoke, drink alcohol, or use illegal drugs. Some items may interact with your medicine. What should I watch for while using this medicine? Tell your doctor if your symptoms do not get better or if they get worse. Visit your doctor or healthcare provider for regular checks on your progress. Because it may take several weeks to see the full effects of this medicine, it is important to continue your treatment as prescribed by your doctor. This medicine may cause serious skin reactions. They can happen weeks to months after starting the medicine. Contact your healthcare provider right away if you notice fevers or flu-like symptoms with a rash. The rash may be red or purple and then turn into blisters or peeling of the skin. Or, you might notice a red rash with swelling of the face, lips, or lymph nodes in your neck or under your arms. Patients and their families should watch out for new or worsening thoughts of suicide or depression. Also watch out for sudden changes in feelings such as feeling anxious, agitated, panicky, irritable, hostile, aggressive, impulsive, severely restless, overly excited and hyperactive, or not being able to sleep. If this happens, especially at the beginning of treatment or after a change in dose, call your healthcare provider. You may get drowsy or dizzy. Do not drive, use machinery, or do anything that needs mental alertness until you know how this  medicine affects you. Do not stand or sit up quickly, especially if you are an older patient. This reduces the risk of dizzy or fainting spells. Alcohol may interfere with the effect of this medicine. Avoid alcoholic drinks. This medicine can cause an increase in blood pressure. This medicine can also cause a sudden drop in your blood pressure, which may make you feel faint and increase the chance of a fall. These effects are most common when you first start the medicine or when the dose is increased, or during use of other medicines that can cause a sudden drop in blood pressure. Check with your doctor for instructions on monitoring your blood pressure while taking this medicine. Your mouth may get dry. Chewing sugarless gum or sucking hard candy, and drinking plenty of water, may help. Contact your doctor if the problem does not go away or is severe. What side effects may I notice from receiving this medicine? Side effects that you should report to your doctor or health care professional as soon as possible:  allergic reactions like skin rash, itching or hives, swelling of the face, lips, or tongue  anxious  breathing problems  confusion  changes in vision  chest pain  confusion  elevated mood, decreased need for sleep, racing  thoughts, impulsive behavior  eye pain  fast, irregular heartbeat  feeling faint or lightheaded, falls  feeling agitated, angry, or irritable  hallucination, loss of contact with reality  high blood pressure  loss of balance or coordination  palpitations  redness, blistering, peeling or loosening of the skin, including inside the mouth  restlessness, pacing, inability to keep still  seizures  stiff muscles  suicidal thoughts or other mood changes  trouble passing urine or change in the amount of urine  trouble sleeping  unusual bleeding or bruising  unusually weak or tired  vomiting  yellowing of the eyes or skin Side effects that  usually do not require medical attention (report to your doctor or health care professional if they continue or are bothersome):  change in sex drive or performance  change in appetite or weight  constipation  dizziness  dry mouth  headache  increased sweating  nausea  tired This list may not describe all possible side effects. Call your doctor for medical advice about side effects. You may report side effects to FDA at 1-800-FDA-1088. Where should I keep my medicine? Keep out of the reach of children. Store at room temperature between 15 and 30 degrees C (59 to 86 degrees F). Throw away any unused medicine after the expiration date. NOTE: This sheet is a summary. It may not cover all possible information. If you have questions about this medicine, talk to your doctor, pharmacist, or health care provider.  2020 Elsevier/Gold Standard (2018-04-16 13:47:50)

## 2019-12-06 NOTE — Assessment & Plan Note (Signed)
She reports smoking is down to 2 cigarettes per day.  She was pleased with her progress in reducing down from 1 PPD.  She continues on the varenicline 1mg  BID. She feels this is helping her significantly.  Her quit plan includes stopping smoking within the next 5 days.

## 2019-12-08 ENCOUNTER — Other Ambulatory Visit (HOSPITAL_COMMUNITY): Payer: Self-pay | Admitting: General Surgery

## 2019-12-08 ENCOUNTER — Other Ambulatory Visit: Payer: Self-pay | Admitting: General Surgery

## 2019-12-10 ENCOUNTER — Ambulatory Visit (HOSPITAL_COMMUNITY): Admission: RE | Admit: 2019-12-10 | Payer: Medicare Other | Source: Ambulatory Visit

## 2019-12-10 ENCOUNTER — Encounter (HOSPITAL_COMMUNITY): Payer: Self-pay

## 2019-12-13 ENCOUNTER — Ambulatory Visit: Payer: Medicare Other

## 2019-12-13 ENCOUNTER — Telehealth: Payer: Self-pay | Admitting: Pharmacist

## 2019-12-13 DIAGNOSIS — M5090 Cervical disc disorder, unspecified, unspecified cervical region: Secondary | ICD-10-CM | POA: Diagnosis not present

## 2019-12-13 DIAGNOSIS — G4733 Obstructive sleep apnea (adult) (pediatric): Secondary | ICD-10-CM | POA: Diagnosis not present

## 2019-12-13 NOTE — Telephone Encounter (Signed)
Patient contacted RE tobacco cessation.    Patient reports continued use of only 2 cigs per day.  Continues on varenicline 1mg  BID at this time.   Patient states she has severe cravings leading to smoking a cigarette in the AM and a second craaving episode when she smokes the second cigarette.    Her plan is to eliminate the AM cigarette and then work on the evening cigarette.  Her short-term goal is to stop smoking in the AM.    We agreed on a phone follow-up in 8 days to assess progress.  I will call the patient in 8 days.

## 2019-12-14 ENCOUNTER — Encounter: Payer: Medicare Other | Attending: General Surgery | Admitting: Skilled Nursing Facility1

## 2019-12-14 DIAGNOSIS — Z6841 Body Mass Index (BMI) 40.0 and over, adult: Secondary | ICD-10-CM | POA: Diagnosis not present

## 2019-12-14 DIAGNOSIS — M109 Gout, unspecified: Secondary | ICD-10-CM | POA: Diagnosis not present

## 2019-12-14 DIAGNOSIS — Z713 Dietary counseling and surveillance: Secondary | ICD-10-CM | POA: Diagnosis not present

## 2019-12-14 DIAGNOSIS — K219 Gastro-esophageal reflux disease without esophagitis: Secondary | ICD-10-CM | POA: Diagnosis not present

## 2019-12-14 DIAGNOSIS — E669 Obesity, unspecified: Secondary | ICD-10-CM

## 2019-12-14 DIAGNOSIS — E785 Hyperlipidemia, unspecified: Secondary | ICD-10-CM | POA: Insufficient documentation

## 2019-12-14 DIAGNOSIS — I509 Heart failure, unspecified: Secondary | ICD-10-CM | POA: Insufficient documentation

## 2019-12-14 DIAGNOSIS — G4733 Obstructive sleep apnea (adult) (pediatric): Secondary | ICD-10-CM | POA: Diagnosis not present

## 2019-12-14 DIAGNOSIS — J449 Chronic obstructive pulmonary disease, unspecified: Secondary | ICD-10-CM | POA: Diagnosis not present

## 2019-12-14 DIAGNOSIS — N189 Chronic kidney disease, unspecified: Secondary | ICD-10-CM | POA: Insufficient documentation

## 2019-12-14 DIAGNOSIS — E1122 Type 2 diabetes mellitus with diabetic chronic kidney disease: Secondary | ICD-10-CM | POA: Insufficient documentation

## 2019-12-14 NOTE — Progress Notes (Signed)
Medical Nutrition Therapy  Referral stated Supervised Weight Loss (SWL) visits needed: pt states [facility] advised her she needs 3 more SWL.   Planned surgery: Sleeve Gastrectomy  Pt expectation of surgery: to lose weight Pt expectation of dietitian: to guide  Nutrition Clearance: Pt seems prepared for surgery and understands the changes neccessary to be successful as represented by the changes she has already accomplished   Appt today via MyChart; pt understands the limitations of this visit identified by name and DOM  NUTRITION ASSESSMENT   Anthropometrics  Start weight at [facility]: 405.6 lbs (date: 01/15/2018)  Wt today: 343.6 pounds (pt reported from home: MyChart Visit Height: 68.5 in BMI: 50.74 kg/m2     Clinical  Medical hx: CHF, OSA, COPD, GERD, Diabetes, CKD, hyperlipidemia, GOUT Medications: fast and long acting insulin  Labs: WBC 11.4, RBC 6.05, Hemoglobin 16.6, HCT 49,  K 3.2, BUN 32, Creatinine 1.48, GFR 46, A1C 9.3 Notable signs/symptoms:  Any previous deficiencies? No  Micronutrient Nutrition Focused Physical Exam: Hair: No issues observed Eyes: No issues observed Mouth: No issues observed Neck: No issues observed Nails: No issues observed Skin: No issues observed  Body Composition Scale 10/21/2019  Current Body Weight 351.7  Total Body Fat % 50.4  Visceral Fat 23  Fat-Free Mass % 49.5   Total Body Water % 39.2  Muscle-Mass lbs 35.8  BMI 50.6  Body Fat Displacement          Torso  lbs 110.3         Left Leg  lbs 22         Right Leg  lbs 22         Left Arm  lbs 11         Right Arm   lbs 11    Lifestyle & Dietary Hx  Pt states she has not had reflux since making dietary changes (has been taking omeprazole as well).  Pt states her goal for herself was 346 and is so excited she surpassed it! Pt states she attributes her determination and mindset and the education she has received for her sucess.  Pt states she knows she will stick with appropriate  portion size, switching to sugar free natures twist  Pt states she is confident there will be no struggles with change after surgery; stating she loves her airfryer and pressure cooker.   Pt states she has had Diarrhea since yesterday and thinks it was from something she ate stating she has been drinking fluid. Pt states she is hooked on the sugar free natures twist. Pt states in the last day or 2 her blood sugars have been over 200; 256 this morning. Pt states if she still has diarrhea tomorrow she is going to call her doctor.    24-Hr Dietary Recall First Meal: fruit + protein shake Snack: fruit Second Meal: fish + caulfilower + french fries or turley sandwich Snack: [place] sausage stick or peanut butter crackers Third Meal:  fish + caulfilower + french fries or [place] sandwich + pickles or cabbage + oxe tails with fat cut off Snack: fruit or honey graham crackers + peanut butter Beverages: slim fast, powerade zero, diet gingerale, water + flavoring    Estimated Energy Needs Calories: 1600  NUTRITION DIAGNOSIS  Overweight/obesity ([place]-3.3) related to past poor dietary habits and physical inactivity as evidenced by patient w/ planned Sleeve Gastrectomy surgery following dietary guidelines for continued weight loss.    NUTRITION INTERVENTION  Nutrition counseling (C-1) and education (E-2)  to facilitate bariatric surgery goals.   Pre-Op Goals Reviewed with the Patient -Keep up the great work!   Handouts Provided Include in the past . Bariatric Surgery handouts (Nutrition Visits, Pre-Op Goals, Protein Shakes, Vitamins & Minerals)  Learning Style & Readiness for Change Teaching method utilized: Visual & Auditory  Demonstrated degree of understanding via: Teach Back  Barriers to learning/adherence to lifestyle change: none identified      MONITORING & EVALUATION Dietary intake, weekly physical activity, body weight, and pre-op goals reached at next nutrition visit.    Next Steps   Patient is to follow up at [facility] for Pre-Op Class >2 weeks before surgery for further nutrition education.

## 2019-12-15 ENCOUNTER — Ambulatory Visit: Payer: Medicare Other

## 2019-12-17 ENCOUNTER — Other Ambulatory Visit: Payer: Self-pay | Admitting: Family Medicine

## 2019-12-17 DIAGNOSIS — Z79899 Other long term (current) drug therapy: Secondary | ICD-10-CM | POA: Diagnosis not present

## 2019-12-17 DIAGNOSIS — G894 Chronic pain syndrome: Secondary | ICD-10-CM | POA: Diagnosis not present

## 2019-12-17 DIAGNOSIS — M5136 Other intervertebral disc degeneration, lumbar region: Secondary | ICD-10-CM | POA: Diagnosis not present

## 2019-12-17 DIAGNOSIS — M5030 Other cervical disc degeneration, unspecified cervical region: Secondary | ICD-10-CM | POA: Diagnosis not present

## 2019-12-21 ENCOUNTER — Telehealth: Payer: Self-pay | Admitting: Pharmacist

## 2019-12-21 DIAGNOSIS — N183 Chronic kidney disease, stage 3 unspecified: Secondary | ICD-10-CM

## 2019-12-21 DIAGNOSIS — Z794 Long term (current) use of insulin: Secondary | ICD-10-CM

## 2019-12-21 MED ORDER — INSULIN LISPRO (1 UNIT DIAL) 100 UNIT/ML (KWIKPEN): 10.0000 [IU] | PEN_INJECTOR | Freq: Three times a day (TID) | SUBCUTANEOUS | Status: DC

## 2019-12-21 MED ORDER — LANTUS SOLOSTAR 100 UNIT/ML ~~LOC~~ SOPN: 24.0000 [IU] | PEN_INJECTOR | Freq: Every day | SUBCUTANEOUS | Status: DC

## 2019-12-21 NOTE — Telephone Encounter (Signed)
Contacted patient RE tobacco cessation.  She has quit in the past for > 6 months.   She reports no smoking since Friday (5 days) and verbalized commitment to continue her abstinence from tobacco. She verbalized goal of quitting much longer than 30 day short-term goal.  Still taking varenicline.   Blood sugars were reported by patient to be elevated to high 200s and low 300s.   Advised to increase Lantus from 20 to 24 units daily  And increase Humalog from 8 to 10 units with meals.   Plan to follow-up by phone in 1 week to reassess both glucose control and tobacco cessation status.

## 2019-12-21 NOTE — Telephone Encounter (Signed)
Noted and agree. 

## 2019-12-22 ENCOUNTER — Telehealth: Payer: Self-pay

## 2019-12-22 NOTE — Telephone Encounter (Signed)
In review of pt records for upcoming OV with Dr. Tarri Glenn on 12/31/19, pt failed to attend PT appt on 11/05/19. Last PT appt attended was 09/22/19. Pt has been discharged d/t non-compliance with appts. Discussed with Dr. Tarri Glenn. Per Dr. Tarri Glenn, pt did not need to f/u until PT completed. Attempted to call pt to inquire further. Unable to reach.

## 2019-12-26 ENCOUNTER — Other Ambulatory Visit: Payer: Self-pay | Admitting: Family Medicine

## 2019-12-27 ENCOUNTER — Other Ambulatory Visit: Payer: Self-pay | Admitting: Family Medicine

## 2019-12-27 ENCOUNTER — Other Ambulatory Visit: Payer: Self-pay | Admitting: Podiatry

## 2019-12-27 DIAGNOSIS — B353 Tinea pedis: Secondary | ICD-10-CM

## 2019-12-27 DIAGNOSIS — E1151 Type 2 diabetes mellitus with diabetic peripheral angiopathy without gangrene: Secondary | ICD-10-CM

## 2019-12-27 NOTE — Telephone Encounter (Signed)
Please advise 

## 2019-12-28 ENCOUNTER — Telehealth: Payer: Self-pay | Admitting: Pharmacist

## 2019-12-28 DIAGNOSIS — Z794 Long term (current) use of insulin: Secondary | ICD-10-CM

## 2019-12-28 MED ORDER — LANTUS SOLOSTAR 100 UNIT/ML ~~LOC~~ SOPN: 30.0000 [IU] | PEN_INJECTOR | Freq: Every day | SUBCUTANEOUS | Status: DC

## 2019-12-28 MED ORDER — INSULIN LISPRO (1 UNIT DIAL) 100 UNIT/ML (KWIKPEN): 12.0000 [IU] | PEN_INJECTOR | Freq: Three times a day (TID) | SUBCUTANEOUS | 3 refills | Status: DC

## 2019-12-28 NOTE — Telephone Encounter (Signed)
Contacted patient RE tobacco cessation and Diabetes control.   Patient reports continued abstinence since last contact ~ 10 days quit from any tobacco.  Reports she remains committed to long-term quit from tobacco.   Diabetes control, however, is not well controlled despite increase in insulin dose at last phone contact.   She reports majority of readings > 250mg /dl and has readings in the high 300s and 1 reading > 400.   Following some discussion, we agreed to increase Lantus from 24 to 30 units once daily AND Humalog from 10 to 12 units prior to meals.  She verbalized understanding of dose increases.    I plan to follow-up in 6 days to assess blood glucose control and tobacco abstinence.   We discussed that if control was problematic at that time she would be asked to come in for an appointment.

## 2019-12-28 NOTE — Telephone Encounter (Signed)
-----   Message from Leavy Cella, [place] sent at 12/21/2019 11:23 AM EST ----- Regarding: tobacco and blood glucose  (quit smoking 11/19)

## 2019-12-28 NOTE — Telephone Encounter (Signed)
Noted and agree. 

## 2019-12-31 ENCOUNTER — Ambulatory Visit: Payer: Medicare Other | Admitting: Gastroenterology

## 2020-01-03 ENCOUNTER — Telehealth: Payer: Self-pay | Admitting: Pharmacist

## 2020-01-03 NOTE — Telephone Encounter (Signed)
Patient contacted to follow-up with tobacco cessation and diabetes control.   Patient reports continued abstinence from smoking.    Patient reports that she believes her elevated blood sugars are due to a change in her coffee creamer.  She plans to make a change to black coffee and reevaluate control.    She states her blood sugars have been in the 200s in the last two days since cutting out the coffee creamer.   She has not appointment scheduled at this time with PCP.  We agreed that I would call her in 1 week and if the current doses of insulin are insufficient, she would then make an appointment with her PCP, Dr. Caron Presume.

## 2020-01-04 ENCOUNTER — Ambulatory Visit: Payer: Medicare Other | Admitting: Family Medicine

## 2020-01-04 ENCOUNTER — Ambulatory Visit: Payer: Medicare Other | Admitting: Cardiology

## 2020-01-04 DIAGNOSIS — M5090 Cervical disc disorder, unspecified, unspecified cervical region: Secondary | ICD-10-CM | POA: Diagnosis not present

## 2020-01-04 DIAGNOSIS — J9621 Acute and chronic respiratory failure with hypoxia: Secondary | ICD-10-CM | POA: Diagnosis not present

## 2020-01-04 DIAGNOSIS — G4733 Obstructive sleep apnea (adult) (pediatric): Secondary | ICD-10-CM | POA: Diagnosis not present

## 2020-01-08 ENCOUNTER — Other Ambulatory Visit: Payer: Self-pay | Admitting: Family Medicine

## 2020-01-10 ENCOUNTER — Encounter: Payer: Self-pay | Admitting: Family Medicine

## 2020-01-11 ENCOUNTER — Other Ambulatory Visit: Payer: Self-pay

## 2020-01-11 ENCOUNTER — Encounter: Payer: Self-pay | Admitting: Family Medicine

## 2020-01-11 ENCOUNTER — Other Ambulatory Visit (INDEPENDENT_AMBULATORY_CARE_PROVIDER_SITE_OTHER): Payer: Medicare Other

## 2020-01-11 ENCOUNTER — Ambulatory Visit (INDEPENDENT_AMBULATORY_CARE_PROVIDER_SITE_OTHER): Payer: Medicare Other | Admitting: Podiatry

## 2020-01-11 ENCOUNTER — Ambulatory Visit: Payer: Medicare Other | Admitting: Gastroenterology

## 2020-01-11 ENCOUNTER — Ambulatory Visit (INDEPENDENT_AMBULATORY_CARE_PROVIDER_SITE_OTHER): Payer: Medicare Other | Admitting: Family Medicine

## 2020-01-11 ENCOUNTER — Encounter: Payer: Self-pay | Admitting: Podiatry

## 2020-01-11 VITALS — BP 118/80 | HR 55 | Ht 69.0 in | Wt 333.0 lb

## 2020-01-11 DIAGNOSIS — N183 Chronic kidney disease, stage 3 unspecified: Secondary | ICD-10-CM | POA: Diagnosis not present

## 2020-01-11 DIAGNOSIS — B351 Tinea unguium: Secondary | ICD-10-CM | POA: Diagnosis not present

## 2020-01-11 DIAGNOSIS — N12 Tubulo-interstitial nephritis, not specified as acute or chronic: Secondary | ICD-10-CM

## 2020-01-11 DIAGNOSIS — Z23 Encounter for immunization: Secondary | ICD-10-CM | POA: Diagnosis not present

## 2020-01-11 DIAGNOSIS — M79674 Pain in right toe(s): Secondary | ICD-10-CM | POA: Diagnosis not present

## 2020-01-11 DIAGNOSIS — E1142 Type 2 diabetes mellitus with diabetic polyneuropathy: Secondary | ICD-10-CM | POA: Diagnosis not present

## 2020-01-11 DIAGNOSIS — Z794 Long term (current) use of insulin: Secondary | ICD-10-CM

## 2020-01-11 DIAGNOSIS — M79675 Pain in left toe(s): Secondary | ICD-10-CM

## 2020-01-11 DIAGNOSIS — E1122 Type 2 diabetes mellitus with diabetic chronic kidney disease: Secondary | ICD-10-CM | POA: Diagnosis not present

## 2020-01-11 DIAGNOSIS — F418 Other specified anxiety disorders: Secondary | ICD-10-CM

## 2020-01-11 LAB — POCT URINALYSIS DIP (MANUAL ENTRY)
Bilirubin, UA: NEGATIVE
Blood, UA: NEGATIVE
Glucose, UA: >=1000 mg/dL — AB
Ketones, POC UA: NEGATIVE mg/dL
Leukocytes, UA: NEGATIVE
Nitrite, UA: NEGATIVE
Protein Ur, POC: NEGATIVE mg/dL
Spec Grav, UA: 1.015 (ref 1.010–1.025)
Urobilinogen, UA: 0.2 E.U./dL
pH, UA: 7 (ref 5.0–8.0)

## 2020-01-11 LAB — GLUCOSE, POCT (MANUAL RESULT ENTRY): POC Glucose: 352 mg/dl — AB (ref 70–99)

## 2020-01-11 MED ORDER — LANTUS SOLOSTAR 100 UNIT/ML ~~LOC~~ SOPN: 35.0000 [IU] | PEN_INJECTOR | Freq: Every day | SUBCUTANEOUS | Status: DC

## 2020-01-11 MED ORDER — SERTRALINE HCL 100 MG PO TABS: 50.0000 mg | ORAL_TABLET | Freq: Every day | ORAL | 3 refills | Status: DC

## 2020-01-11 MED ORDER — VENLAFAXINE HCL ER 75 MG PO CP24: 75.0000 mg | ORAL_CAPSULE | Freq: Every day | ORAL | 3 refills | Status: DC

## 2020-01-11 NOTE — Progress Notes (Addendum)
SUBJECTIVE:   CHIEF COMPLAINT / HPI:   Pain and depression Patient reports that since starting the Cymbalta her pain has not improved but she feels like her depression is worsened.  She denies any thoughts of SI or HI.  Feels like she is down because her blood sugars are not in control and she feels like she is taking a step back from her improvement with her diabetes.  Her most recent hemoglobin A1c was 9.5 and this is distressing to her because she is compliant with her medications and she feels like she has made major changes to her diet.  We discussed her options and she would like to switch to another medication.  Diabetes Patient reports that her blood sugars have been extremely elevated over the last 2 weeks.  Her blood sugar log shows the lowest fasting blood sugar in the morning is 213 and afternoons have ranged between 3 and 400.  Highest blood sugar seen is in the 500s.  She reports good compliance with her medications and that she increased to 30 units of Lantus daily and 12 units of Humalog.  She is also on Ozempic.  Denies any hypoglycemic episodes  Spoke with Dr. Valentina Lucks regarding the patient's blood sugars.  We discussed possible causes for the elevated blood sugars and it is concerning that they are so high.  Patient initially denied any signs or symptoms of infections so I called the patient to specify if she has had any strange burning with urination and she reported she has been having some itching and discomfort.  Patient has chronic back pain but I am concerned that she may have some kind of UTI or pyelonephritis.  I have ordered a CBC and a UA and she will come to the clinic to have those collected.  PERTINENT  PMH / PSH: Type 2 diabetes, depression, chronic pain  OBJECTIVE:   BP 118/80   Pulse (!) 55   Ht 5\' 9"  (1.753 m)   Wt (!) 333 lb (151 kg)   LMP 04/10/2006   SpO2 98%   BMI 49.18 kg/m   General: In no acute distress although upset regarding her blood sugars  and pain Cardiac: Regular rate and rhythm, no murmurs appreciated Respiratory: Normal work of breathing, lungs clear to auscultation bilaterally MSK: Tenderness to palpation low back which is chronic.  Is able to ambulate but has pain with ambulation.  No red flag symptoms noted  PHQ9 SCORE ONLY 01/11/2020 12/06/2019 11/08/2019  PHQ-9 Total Score 5 0 0    ASSESSMENT/PLAN:   Type 2 diabetes mellitus with stage 3 chronic kidney disease, with long-term current use of insulin (HCC) Diabetes is still uncontrolled even with recent increase in Lantus and Humalog.  Hemoglobin A1c on 11/08/2019 was 9.3 up from 7.5.  She reports good medication adherence.  Her Lantus was increased to 30 units from 24.  She was also started on Ozempic and her Humalog was increased to 12 units 3 times a day.  Her blood sugars have ranged from 213-550. -Increased Lantus dose to 35 units in the morning -Continue Humalog 12 units 3 times daily -Continue Ozempic -Continue SGLT2 inhibitor Jardiance 25 mg once daily -Discussed blood glucose monitoring and hypoglycemia symptoms as well as what to do with hypoglycemia -Patient will keep a blood sugar log and we will follow-up on her diabetes on 12/29 -Follow-up on UA and CBC  Depression with anxiety Patient feels like her depression is not doing well since switching from  the Zoloft to Cymbalta.  She also feels like she has not seen much benefit regarding her pain.  She is interested in switching medication so we discussed the option of switching back to Zoloft because it works so well with her depression versus trying venlafaxine that may help with her chronic pain as well as her depression.  Patient is open to switching to venlafaxine for a month to see if she feels benefit from this. -Patient transition from duloxetine to venlafaxine equivalent dose -Started on 75 mg of venlafaxine daily -Strict return precautions given -Scheduled follow-up in 2 weeks and then will follow up  in 1 month     [name], MD [place]

## 2020-01-11 NOTE — Assessment & Plan Note (Signed)
Patient feels like her depression is not doing well since switching from the Zoloft to Cymbalta.  She also feels like she has not seen much benefit regarding her pain.  She is interested in switching medication so we discussed the option of switching back to Zoloft because it works so well with her depression versus trying venlafaxine that may help with her chronic pain as well as her depression.  Patient is open to switching to venlafaxine for a month to see if she feels benefit from this. -Patient transition from duloxetine to venlafaxine equivalent dose -Started on 75 mg of venlafaxine daily -Strict return precautions given -Scheduled follow-up in 2 weeks and then will follow up in 1 month

## 2020-01-11 NOTE — Assessment & Plan Note (Addendum)
Diabetes is still uncontrolled even with recent increase in Lantus and Humalog.  Hemoglobin A1c on 11/08/2019 was 9.3 up from 7.5.  She reports good medication adherence.  Her Lantus was increased to 30 units from 24.  She was also started on Ozempic and her Humalog was increased to 12 units 3 times a day.  Her blood sugars have ranged from 213-550. -Increased Lantus dose to 35 units in the morning -Continue Humalog 12 units 3 times daily -Continue Ozempic -Continue SGLT2 inhibitor Jardiance 25 mg once daily -Discussed blood glucose monitoring and hypoglycemia symptoms as well as what to do with hypoglycemia -Patient will keep a blood sugar log and we will follow-up on her diabetes on 12/29 -Follow-up on UA and CBC

## 2020-01-11 NOTE — Progress Notes (Deleted)
Spoke with Dr. Valentina Lucks regarding the patient's blood sugars.  We discussed possible causes for the elevated blood sugars and it is concerning that they are so high.  Patient initially denied any signs or symptoms of infections so I called the patient to specify if she has had any strange burning with urination and she reported she has been having some itching and discomfort.  Patient has chronic back pain but I am concerned that she may have some kind of UTI or pyelonephritis.  I have ordered a CBC and a UA and she will come to the clinic to have those collected.

## 2020-01-11 NOTE — Progress Notes (Signed)
This patient returns to my office for at risk foot care.  This patient requires this care by a professional since this patient will be at risk due to having chronic kidney disease and type 2 diabetes.  This patient is unable to cut nails herself since the patient cannot reach her nails.These nails are painful walking and wearing shoes.  This patient presents for at risk foot care today.  General Appearance  Alert, conversant and in no acute stress.  Vascular  Dorsalis pedis and posterior tibial  pulses are palpable  bilaterally.  Capillary return is within normal limits  bilaterally. Temperature is within normal limits  bilaterally.  Neurologic  Senn-Weinstein monofilament wire test diminished  bilaterally. Muscle power within normal limits bilaterally.  Nails Thick disfigured discolored nails with subungual debris  from hallux to fifth toes bilaterally. No evidence of bacterial infection or drainage bilaterally.  Orthopedic  No limitations of motion  feet .  No crepitus or effusions noted.  No bony pathology or digital deformities noted.  HAV  Left foot.  Skin  normotropic skin with no porokeratosis noted bilaterally.  No signs of infections or ulcers noted.   Thickened hyperkeratotic tissue  right foot plantarly.  Onychomycosis  Pain in right toes  Pain in left toes  Consent was obtained for treatment procedures.   Mechanical debridement of nails 1-5  bilaterally performed with a nail nipper.  Filed with dremel without incident.    Return office visit   9 weeks                 Told patient to return for periodic foot care and evaluation due to potential at risk complications.   Gardiner Barefoot DPM

## 2020-01-11 NOTE — Patient Instructions (Signed)
It was great seeing you today.  I am sorry you are having these issues with your blood sugars.  For your diabetes I want to increase your Lantus to 35 units daily and I want you to continue to keep a log.  If you have any blood sugars below 100 please give [place] a call.  Regarding your depression medications I am sorry the Cymbalta is not helping you, lets switch to venlafaxine.  I have sent a prescription for 75 mg daily.  Remember that you may feel a little bit strange for the first few days of taking it and it will take some time for your body to get used to it.  I like to follow-up in 1 month to see how you are doing regarding your depression and your pain.  We can always go back to the Zoloft if needed.  If you have any issues, questions, concerns please feel free to send me a message.

## 2020-01-11 NOTE — Addendum Note (Signed)
Addended by: Concepcion Living on: 01/11/2020 11:43 AM   Modules accepted: Orders

## 2020-01-12 ENCOUNTER — Encounter: Payer: Self-pay | Admitting: Family Medicine

## 2020-01-12 ENCOUNTER — Other Ambulatory Visit: Payer: Self-pay

## 2020-01-12 ENCOUNTER — Ambulatory Visit (INDEPENDENT_AMBULATORY_CARE_PROVIDER_SITE_OTHER): Payer: Medicare Other | Admitting: Cardiology

## 2020-01-12 ENCOUNTER — Encounter: Payer: Self-pay | Admitting: Cardiology

## 2020-01-12 ENCOUNTER — Encounter: Payer: Medicare Other | Attending: General Surgery | Admitting: Skilled Nursing Facility1

## 2020-01-12 VITALS — BP 146/80 | HR 116 | Ht 69.0 in | Wt 333.6 lb

## 2020-01-12 DIAGNOSIS — I5032 Chronic diastolic (congestive) heart failure: Secondary | ICD-10-CM

## 2020-01-12 DIAGNOSIS — G4733 Obstructive sleep apnea (adult) (pediatric): Secondary | ICD-10-CM | POA: Diagnosis not present

## 2020-01-12 DIAGNOSIS — E1122 Type 2 diabetes mellitus with diabetic chronic kidney disease: Secondary | ICD-10-CM | POA: Insufficient documentation

## 2020-01-12 DIAGNOSIS — I10 Essential (primary) hypertension: Secondary | ICD-10-CM

## 2020-01-12 DIAGNOSIS — N183 Chronic kidney disease, stage 3 unspecified: Secondary | ICD-10-CM | POA: Diagnosis not present

## 2020-01-12 DIAGNOSIS — N184 Chronic kidney disease, stage 4 (severe): Secondary | ICD-10-CM | POA: Diagnosis not present

## 2020-01-12 DIAGNOSIS — I471 Supraventricular tachycardia: Secondary | ICD-10-CM

## 2020-01-12 DIAGNOSIS — Z794 Long term (current) use of insulin: Secondary | ICD-10-CM | POA: Insufficient documentation

## 2020-01-12 DIAGNOSIS — E7800 Pure hypercholesterolemia, unspecified: Secondary | ICD-10-CM

## 2020-01-12 DIAGNOSIS — M5090 Cervical disc disorder, unspecified, unspecified cervical region: Secondary | ICD-10-CM | POA: Diagnosis not present

## 2020-01-12 LAB — CBC
Hematocrit: 54.7 % — ABNORMAL HIGH (ref 34.0–46.6)
Hemoglobin: 18.7 g/dL — ABNORMAL HIGH (ref 11.1–15.9)
MCH: 28.4 pg (ref 26.6–33.0)
MCHC: 34.2 g/dL (ref 31.5–35.7)
MCV: 83 fL (ref 79–97)
Platelets: 311 10*3/uL (ref 150–450)
RBC: 6.59 x10E6/uL — ABNORMAL HIGH (ref 3.77–5.28)
RDW: 15.6 % — ABNORMAL HIGH (ref 11.7–15.4)
WBC: 13 10*3/uL — ABNORMAL HIGH (ref 3.4–10.8)

## 2020-01-12 NOTE — Progress Notes (Signed)
Medical Nutrition Therapy  Referral stated Supervised Weight Loss (SWL) visits needed: pt states [facility] advised her she needs 3 more SWL. 2 out of 3   Planned surgery: Sleeve Gastrectomy  Pt expectation of surgery: to lose weight Pt expectation of dietitian: to guide  Nutrition Clearance: Pt seems prepared for surgery and understands the changes neccessary to be successful as represented by the changes she has already accomplished   NUTRITION ASSESSMENT   Anthropometrics  Start weight at [facility]: 405.6 lbs (date: 01/15/2018)  Wt today: 321 pounds  Height: 68.5 in BMI: 48.10 kg/m2     Clinical  Medical hx: CHF, OSA, COPD, GERD, Diabetes, CKD, hyperlipidemia, GOUT Medications: fast and long acting insulin  Labs: WBC 13, RBC 6.59, Hemoglobin 18.7, HCT 54.7,  K 3.2, BUN 32, Creatinine 1.48, GFR 46, A1C 9.3 Notable signs/symptoms:  Any previous deficiencies? No  Micronutrient Nutrition Focused Physical Exam: Hair: No issues observed Eyes: No issues observed Mouth: No issues observed Neck: No issues observed Nails: No issues observed Skin: No issues observed  Body Composition Scale 10/21/2019  Current Body Weight 351.7  Total Body Fat % 50.4  Visceral Fat 23  Fat-Free Mass % 49.5   Total Body Water % 39.2  Muscle-Mass lbs 35.8  BMI 50.6  Body Fat Displacement          Torso  lbs 110.3         Left Leg  lbs 22         Right Leg  lbs 22         Left Arm  lbs 11         Right Arm   lbs 11    Lifestyle & Dietary Hx  Pt states she has not had reflux since making dietary changes (has been taking omeprazole as well).  Pt states her goal for herself was 346 and is so excited she surpassed it! Pt states she attributes her determination and mindset and the education she has received for her sucess.  Pt states she knows she will stick with appropriate portion size, switching to sugar free natures twist  Pt states she is confident there will be no struggles with change after  surgery; stating she loves her airfryer and pressure cooker.   Pt states her blood sugars have been in the 300's and 200's and some in the 400's-500's. Pt states her insulin will be increased. Pt states her doctor is checking if she has an infection. Pt states she takes her insulin daily.   Pt state she was able to fit in her foot doctors chair and her waste didn't hit both sides so she is very excited.    24-Hr Dietary Recall First Meal: fruit + protein shake Snack: fruit Second Meal: fruit + lettuce salad + smoked [place] Snack: [place] sausage stick or peanut butter crackers Third Meal: fruit + lettuce salad + smoked [place] Snack: fruit or honey graham crackers + peanut butter Beverages: slim fast, powerade zero, diet gingerale, water + flavoring, sugar free natures twist   Estimated Energy Needs Calories: 1600  NUTRITION DIAGNOSIS  Overweight/obesity ([place]-3.3) related to past poor dietary habits and physical inactivity as evidenced by patient w/ planned Sleeve Gastrectomy surgery following dietary guidelines for continued weight loss.    NUTRITION INTERVENTION  Nutrition counseling (C-1) and education (E-2) to facilitate bariatric surgery goals.   Pre-Op Goals Reviewed with the Patient -Keep up the great work!   Handouts Provided Include in the past . Bariatric Surgery  handouts (Nutrition Visits, Pre-Op Goals, Protein Shakes, Vitamins & Minerals)  Learning Style & Readiness for Change Teaching method utilized: Visual & Auditory  Demonstrated degree of understanding via: Teach Back  Barriers to learning/adherence to lifestyle change: none identified      MONITORING & EVALUATION Dietary intake, weekly physical activity, body weight, and pre-op goals reached at next nutrition visit.    Next Steps  Patient is to follow up at [facility] for Pre-Op Class >2 weeks before surgery for further nutrition education.

## 2020-01-12 NOTE — Patient Instructions (Signed)

## 2020-01-12 NOTE — Progress Notes (Signed)
Cardiology Office Note:    Date:  03/50/0938   ID:  [name], DOB [birth year], MRN [id number]  PCP:  [name], MD  Cardiologist:  [name], MD PhD  Referring MD: [name], MD   CC: follow up  History of Present Illness:    [name] is a 54 y.o. female with chronic diastolic heart failure, chronic hypoxic respiratory failure, COPD, OSA on PAP, morbid obesity, paroxysmal SVT, type II diabetes on insulin, CKD stage 3, hypertension, hyperlipidemia     Cardiac history: Complex, see my initial notes. Has struggled with diastolic heart failure, has chest pai when she gets fluid overloaded. Was on oxygen, having "breathing crises" that seemed to be related to paroxysmal SVT. She was well controlled for many months, then had an episode requiring hospitalization 08/2018 for chest pain and elevated weight. HsTn unremarkable (6 -> 3), no SVT on monitor. BNP 41.7. Covid negative. Was diuresed, lost fluid, swelling on left side went down while she was laying down but came back when she got back up. She attributes the episode to a myelogram, gained 16 lbs and started having symptoms.  Today: Doing very well. Has been diligent about her diet changes. No fried foods, uses air fryer. Lots of vegetables, lean protein.   No wheelchair, no walker, no oxygen use now. Down about 80 lbs. Peak weight 01/2018 was 417 lbs.   Going to diabetic nutrition class, following instructions closely.   Working on increasing her walking. Walks from parking lot to doctor's appts, walks around the store. Wants to get her walker fixed so she can walk down the street to her cousin's house. Plans to get back to swimming.  Has been completely tobacco free for the last month, congratulated on this. Has now been completely clean for >4.5 years.  Planned for gastric sleeve surgery in hopefully March 2022. She is motivated for this. Ultimate goal is to get to 200 lbs. She can have her hip  replaced at 270 lbs.   In terms of surgery, she has no chest pain and no new symptoms. She has had a myoview within the last year. She is acceptable risk for surgery, see below, and is optimized from a cardiovascular standpoint.  Denies chest pain, shortness of breath at rest or with normal exertion. No PND, orthopnea, LE edema or unexpected weight gain. No syncope or palpitations.  Past Medical History:  Diagnosis Date   Alcoholism ([place])    Anxiety    Arthritis 04-10-11   hips, shoulders, back   Asthma    Bipolar disorder ([place])    Cardiomegaly    Carpal tunnel syndrome, bilateral    Cervical cancer ([place]) 1993   cervical, no treatment done, went away per pt   Cervical dysplasia or atypia 04-10-11   '93- once dx.-got pregnant-no intervention, then postpartum, no dysplasia found   CHF (congestive heart failure) ([place])    no cardiologist 2014 dx, none now   Chronic hypoxemic respiratory failure (HCC)    Chronic kidney disease    stage 3 kidney disease   Condyloma - gluteal cleft 04/09/2011   Removed by general surgery. Pathology showed Condyloma, gluteal CONDYLOMA ACUMINATUM.    COPD (chronic obstructive pulmonary disease) ([place])    Depression    Diabetes mellitus without complication (HCC)    Dyspnea    with actity, sitting   Fall 12/16/2016   Fibrocystic breast disease    GERD (gastroesophageal reflux disease)    Grade I diastolic dysfunction 11/13/5100  Noted on ECHO   Hepatitis    hep B-count is low at present,doesn't register   History of PSVT (paroxysmal supraventricular tachycardia)    Hypercalcemia    Hyperlipidemia    Hypertension    Incomplete left bundle branch block (LBBB) 09/24/2018   Noted on EKG   Lung nodule 11/2017   right and left lung   Migraine headache    none recent   Morbid obesity ([place]) 03/27/2006   Neuropathy    Pneumonia    walking pneu 15 yrs. ago   Skin lesion 03/15/2011   In gluteal crease now s/p removal by Dr. [name] of General Surgery on  3/12. Path shows condyloma.      Sleep apnea 04-10-11   uses cpap, pt does not know settings   Trigger finger    left third   Urge incontinence of urine     Past Surgical History:  Procedure Laterality Date   ANAL FISTULECTOMY  04/17/2011   Procedure: FISTULECTOMY ANAL;  Surgeon: Imogene Burn. [name], MD;  Location: [facility];  Service: General;  Laterality: N/A;  Excision of Condyloma Gluteal Cleft    ANAL RECTAL MANOMETRY N/A 03/26/2019   Procedure: ANO RECTAL MANOMETRY;  Surgeon: Thornton Park, MD;  [facility];  Service: Gastroenterology;  Laterality: N/A;   BIOPSY  03/09/2019   Procedure: BIOPSY;  Surgeon: Thornton Park, MD;  [facility];  Service: Gastroenterology;;   BOTOX INJECTION N/A 12/29/2018   Procedure: BOTOX INJECTION(100 UNITS)  WITH CYSTOSCOPY;  Surgeon: Ceasar Mons, MD;  [facility];  Service: Urology;  Laterality: N/A;   BOTOX INJECTION N/A 07/27/2019   Procedure: BOTOX INJECTION WITH CYSTOSCOPY;  Surgeon: Ceasar Mons, MD;  [facility];  Service: Urology;  Laterality: N/A;  ONLY NEEDS 30 MIN   BREAST CYST EXCISION     bilateral breast, 3 cysts removed from each breast   BREAST EXCISIONAL BIOPSY Right    x 3   BREAST EXCISIONAL BIOPSY Left    x 3   CARPAL TUNNEL RELEASE Bilateral    Cervical biospy     CERVICAL FUSION  04-10-11   x3- cervical fusion with plating and screws-Dr. [name]   COLONOSCOPY WITH PROPOFOL N/A 07/06/2015   Procedure: COLONOSCOPY WITH PROPOFOL;  Surgeon: [name], MD;  [facility];  Service: Endoscopy;  Laterality: N/A;   COLONOSCOPY WITH PROPOFOL N/A 03/09/2019   Procedure: COLONOSCOPY WITH PROPOFOL;  Surgeon: Thornton Park, MD;  [facility];  Service: Gastroenterology;  Laterality: N/A;   DOPPLER ECHOCARDIOGRAPHY  04/06/2012   AT [place] 55-60%   FOOT SURGERY Left 2018   FRACTURE SURGERY     little toe left foot   I & D EXTREMITY Left 12/18/2016    Procedure: IRRIGATION AND DEBRIDEMENT LEFT FOOT, CLOSURE;  Surgeon: [name], MD;  Location: [place];  Service: Orthopedics;  Laterality: Left;   LUMBAR [place] SURGERY  04-10-11   x5-Lumbar fusion-retained hardware.(Dr. Louanne Skye)   MULTIPLE TOOTH EXTRACTIONS     POLYPECTOMY  03/09/2019   Procedure: POLYPECTOMY;  Surgeon: Thornton Park, MD;  [facility];  Service: Gastroenterology;;   SHOULDER ARTHROSCOPY WITH ROTATOR CUFF REPAIR AND SUBACROMIAL DECOMPRESSION Left 05/07/2019   Procedure: LEFT SHOULDER ARTHROSCOPY WITH DEBRIDEMENT, DISTAL CLAVICLE EXCISION,  SUBACROMIAL DECOMPRESSION AND POSSIBLE ROTATOR CUFF REPAIR;  Surgeon: [name], MD;  Location: [place];  Service: Orthopedics;  Laterality: Left;   TEE WITHOUT CARDIOVERSION N/A 04/06/2012   Procedure: TRANSESOPHAGEAL ECHOCARDIOGRAM (TEE);  Surgeon: [name], MD;  Location: [facility];  Service: Cardiovascular;  Laterality: N/A;   TRIGGER FINGER RELEASE Right    middle finger   TRIGGER FINGER RELEASE Left 06/28/2016   Procedure: RELEASE TRIGGER FINGER LEFT 3RD FINGER;  Surgeon: [name], MD;  Location: [place];  Service: Orthopedics;  Laterality: Left;   TRIGGER FINGER RELEASE Right 06/12/2016   Procedure: RIGHT INDEX FINGER TRIGGER RELEASE;  Surgeon: [name], MD;  Location: [place];  Service: Orthopedics;  Laterality: Right;   TRIGGER FINGER RELEASE Right 01/19/2018   Procedure: RIGHT RING FINGER TRIGGER FINGER RELEASE;  Surgeon: [name], MD;  Location: [place];  Service: Orthopedics;  Laterality: Right;   TRIGGER FINGER RELEASE Left 05/07/2019   Procedure: RELEASE TRIGGER FINGER LEFT INDEX FINGER;  Surgeon: [name], MD;  Location: [place];  Service: Orthopedics;  Laterality: Left;    Current Medications: Current Outpatient Medications on File Prior to Visit  Medication Sig   Accu-Chek Softclix Lancets lancets TEST 4 TIMES DAILY   acetaminophen (TYLENOL) 500 MG tablet Take 1,000 mg by mouth 2 (two) times  daily.   albuterol (VENTOLIN HFA) 108 (90 Base) MCG/ACT inhaler Inhale 2 puffs into the lungs every 6 (six) hours as needed for wheezing or shortness of breath.   allopurinol (ZYLOPRIM) 300 MG tablet Take 300 mg by mouth daily.   ammonium lactate (LAC-HYDRIN) 12 % lotion APPLY TWICE A DAY AS NEEDED FOR DRY SKIN (Patient taking differently: Apply 1 application topically in the morning and at bedtime.)   atorvastatin (LIPITOR) 40 MG tablet TAKE 1 TABLET BY MOUTH EVERY DAY   b complex vitamins capsule Take 1 capsule by mouth daily.   B-D UF III MINI PEN NEEDLES 31G X 5 MM MISC CHECK SUGARS 4 TIMES A DAY BEFORE MEALS AND AT BEDTIME.   baclofen (LIORESAL) 10 MG tablet Take 1 tablet (10 mg total) by mouth 3 (three) times daily as needed for muscle spasms. (Patient taking differently: Take 10 mg by mouth 3 (three) times daily.)   blood glucose meter kit and supplies KIT Dispense based on patient and insurance preference. Use up to four times daily as directed. (FOR ICD-9 250.00, 250.01).   busPIRone (BUSPAR) 10 MG tablet Take 1 tablet (10 mg total) by mouth 3 (three) times daily.   diclofenac Sodium (VOLTAREN) 1 % GEL APPLY 2 GRAMS TO AFFECTED AREA 4 TIMES A DAY   diltiazem (CARDIZEM CD) 120 MG 24 hr capsule TAKE 1 CAPSULE (120 MG TOTAL) BY MOUTH 2 (TWO) TIMES DAILY.   docusate sodium (COLACE) 100 MG capsule Take 200 mg by mouth 2 (two) times daily.   empagliflozin (JARDIANCE) 25 MG TABS tablet Take 1 tablet (25 mg total) by mouth daily.   febuxostat (ULORIC) 40 MG tablet Take 40 mg by mouth at bedtime.    fluticasone (FLONASE) 50 MCG/ACT nasal spray Place 2 sprays into both nostrils 2 (two) times daily.   Fluticasone-Umeclidin-Vilant (TRELEGY ELLIPTA) 100-62.5-25 MCG/INH AEPB Inhale 1 puff into the lungs daily.   furosemide (LASIX) 40 MG tablet TAKE 2 TABLETS (80 MG TOTAL) BY MOUTH 2 (TWO) TIMES DAILY.   gabapentin (NEURONTIN) 300 MG capsule Take 2 capsules (600 mg total) by mouth 3 (three) times daily.  TAKE 2 CAPSULES (600 MG TOTAL) BY MOUTH in the morning and at lunch and take 3 capsules at night   glucose blood (ACCU-CHEK AVIVA PLUS) test strip 1 each by Other route 3 (three) times daily. TEST  3 TIMES DAILY   HYDROcodone-acetaminophen (NORCO) 7.5-325 MG tablet Take 1 tablet by mouth every 6 (six) hours as needed for moderate pain. (Patient taking differently: Take 1 tablet by mouth in the morning, at noon, in the evening, and at bedtime.)   hydrOXYzine (ATARAX/VISTARIL) 10 MG tablet Take 1 tablet (10 mg total) by mouth daily.   insulin glargine (LANTUS SOLOSTAR) 100 UNIT/ML Solostar Pen Inject 35 Units into the skin daily before breakfast.   insulin lispro (HUMALOG) 100 UNIT/ML KwikPen Inject 12 Units into the skin 3 (three) times daily before meals.   ketoconazole (NIZORAL) 2 % cream APPLY 1 FINGERTIP AMOUNT TO EACH FOOT DAILY.   KLOR-CON M20 20 MEQ tablet TAKE 1 TABLET TWICE WEEKLY ON MONDAY AND THURSDAY   loratadine (CLARITIN) 10 MG tablet TAKE 1 TABLET BY MOUTH EVERY DAY   losartan (COZAAR) 100 MG tablet Take 1 tablet (100 mg total) by mouth daily.   metolazone (ZAROXOLYN) 2.5 MG tablet Take 1 tablet (2.5 mg total) by mouth 2 (two) times a week. TAKE 1 TABLET TWICE WEEKLY ON MONDAYS AND THURSDAYS ONLY (Patient taking differently: Take 2.5 mg by mouth See admin instructions. TAKE 2.5 BY MOUTH  TWICE WEEKLY ON MONDAYS AND THURSDAYS ONLY)   montelukast (SINGULAIR) 10 MG tablet TAKE 1 TABLET BY MOUTH EVERYDAY AT BEDTIME   Multiple Vitamins-Minerals (MULTIVITAMIN WITH MINERALS) tablet Take 1 tablet by mouth daily.   NARCAN 4 MG/0.1ML LIQD nasal spray kit Place 1 spray into the nose as needed (accidental overdose).   nicotine polacrilex (NICORETTE) 4 MG gum Take 1 each (4 mg total) by mouth as needed for smoking cessation.   NON FORMULARY Uses a C-PAP at bedtime   nystatin (MYCOSTATIN) 100000 UNIT/ML suspension TAKE 5 MLS (500,000 UNITS TOTAL) BY MOUTH 4 (FOUR) TIMES DAILY. SWISH AND SWALLOW.    Omega-3 Fatty Acids (FISH OIL) 1000 MG CAPS Take 1,000 mg by mouth daily.    omeprazole (PRILOSEC) 20 MG capsule TAKE 1 CAPSULE BY MOUTH EVERY DAY   Semaglutide,0.25 or 0.5MG/DOS, (OZEMPIC, 0.25 OR 0.5 MG/DOSE,) 2 MG/1.5ML SOPN Inject 0.25 mg into the skin once a week. Minus 2 clicks   varenicline (CHANTIX CONTINUING MONTH PAK) 1 MG tablet Take 1 tablet (1 mg total) by mouth 2 (two) times daily.   venlafaxine XR (EFFEXOR XR) 75 MG 24 hr capsule Take 1 capsule (75 mg total) by mouth daily with breakfast.   [DISCONTINUED] atorvastatin (LIPITOR) 40 MG tablet Take 1 tablet (40 mg total) by mouth daily.   [DISCONTINUED] CHANTIX 0.5 MG tablet TAKE 1 TABLET (0.5 MG TOTAL) BY MOUTH 2 (TWO) TIMES DAILY.   [DISCONTINUED] diclofenac Sodium (VOLTAREN) 1 % GEL Apply 2 g topically 4 (four) times daily.   [DISCONTINUED] diclofenac Sodium (VOLTAREN) 1 % GEL Apply 2 g topically 4 (four) times daily.   [DISCONTINUED] loratadine (CLARITIN) 10 MG tablet TAKE 1 TABLET BY MOUTH EVERY DAY (Patient taking differently: Take 10 mg by mouth daily. )   [DISCONTINUED] nicotine polacrilex ([employer] NICOTINE POLACRILEX) 4 MG gum Take 1 each (4 mg total) by mouth as needed for smoking cessation.   Current Facility-Administered Medications on File Prior to Visit  Medication   technetium tetrofosmin (TC-MYOVIEW) injection 97.5 millicurie     Allergies:   Aspirin, Methadone hcl, Corticosteroids, and Levofloxacin   Social History   Tobacco Use   Smoking status: Former Smoker    Packs/day: 0.20    Years: 40.00    Pack years: 8.00    Types: Cigarettes  Start date: 01/28/1977    Quit date: 07/25/2019    Years since quitting: 0.4   Smokeless tobacco: Never Used   Tobacco comment: 4-5 cigarettes per day  Vaping Use   Vaping Use: Never used  Substance Use Topics   Alcohol use: No    Alcohol/week: 0.0 standard drinks   Drug use: Not Currently    Types: Marijuana, "Crack" cocaine    Comment: hx marijuana usemany years ago,  Crack -none since 11/2015    Family History: The patient's family history includes Alcohol abuse in her father; Arthritis in her sister; Asthma in her sister; Breast cancer in her maternal aunt; Depression in her sister; Diabetes in an other family member; Hypertension in her father and another family member; Pneumonia in her father; Pulmonary embolism in her mother; Sickle cell anemia in her son; Stroke in her father. no CAD known. Father died age 38 from stroke, mother died age 83 of blood clot. Has a 64 year old child with sickle cell disease.  ROS:   Please see the history of present illness.  Additional pertinent ROS otherwise unremarkable.  EKGs/Labs/Other Studies Reviewed:    The following studies were reviewed today: Stress test 04/09/19 The left ventricular ejection fraction is mildly decreased (45-54%). Nuclear stress EF: 47%. There was no ST segment deviation noted during stress. This is an intermediate risk study due to reduced systolic function. There is no ischemia. New wall motion abnormalities noted. Consider echo if clinically warranted.  Monitor 12/18/17 3 days of rhythm interpreted on Zio patch. Patient had a min HR of 58 bpm, max HR of 193 bpm, and avg HR of 81 bpm. Predominant underlying rhythm was Sinus Rhythm. 217 Supraventricular Tachycardia runs occurred, the run with the fastest interval lasting 6 beats with a max rate of 193 bpm, the longest lasting 1 min 13 secs with an avg rate of 151 bpm. True duration of Supraventricular Tachycardia difficult to ascertain due to Artifact. Thirteen triggered patient events recorded. Supraventricular Tachycardia was detected within +/- 45 seconds of symptomatic patient event(s). Isolated SVEs were occasional (2.0%, 6813), SVE Couplets were rare (<1.0%, 235), and SVE Triplets were rare (<1.0%, 66). No significant ventricular ectopy noted.  Echo 9.4.19 Study Conclusions   - Left ventricle: The cavity size was normal. Wall  thickness was   normal. Systolic function was normal. The estimated ejection   fraction was in the range of 55% to 60%. Wall motion was normal;   there were no regional wall motion abnormalities. Doppler   parameters are consistent with abnormal left ventricular   relaxation (grade 1 diastolic dysfunction).  Lexiscan MPI 11/11/17 Study Highlights   Nuclear stress EF: 71%. The left ventricular ejection fraction is hyperdynamic (>65%). There was no ST segment deviation noted during stress. There is a small defect of mild severity present in the apical septal location. The defect is non-reversible. No ischemia noted. This is a low risk study.   EKG:  EKG is personally reviewed today.  The ekg ordered 07/06/19 demonstrates normal sinus rhythm at 77 bpm  Recent Labs: 07/21/2019: ALT 34; BUN 32; Creatinine, Ser 1.48; Potassium 3.2; Sodium 141 01/11/2020: Hemoglobin 18.7; Platelets 311  Recent Lipid Panel    Component Value Date/Time   CHOL 146 09/24/2018 2336   CHOL 145 10/27/2017 0944   TRIG 165 (H) 09/24/2018 2336   HDL 37 (L) 09/24/2018 2336   HDL 55 10/27/2017 0944   CHOLHDL 3.9 09/24/2018 2336   VLDL 33 09/24/2018 2336  LDLCALC 76 09/24/2018 2336   LDLCALC 70 10/27/2017 0944   LDLDIRECT 114 (H) 09/15/2012 1559    Physical Exam:    VS:  BP (!) 146/80    Pulse (!) 116    Ht _0  (1.753 m)    Wt (!) 333 lb 9.6 oz (151.3 kg)    LMP 04/10/2006    BMI 49.26 kg/m     Wt Readings from Last 3 Encounters:  01/12/20 (!) 321 lb (145.6 kg)  01/11/20 (!) 333 lb (151 kg)  12/14/19 (!) 343 lb 9.6 oz (155.9 kg)   GEN: Well nourished, well developed in no acute distress HEENT: Normal, moist mucous membranes NECK: No JVD CARDIAC: regular rhythm, normal S1 and S2, no rubs or gallops. No murmur. VASCULAR: Radial and DP pulses 2+ bilaterally. No carotid bruits RESPIRATORY:  Clear to auscultation without rales, wheezing or rhonchi  ABDOMEN: Soft, non-tender, non-distended MUSCULOSKELETAL:   Ambulates independently SKIN: Warm and dry, improved but chronic bilateral brawny LE edema NEUROLOGIC:  Alert and oriented x 3. No focal neuro deficits noted. PSYCHIATRIC:  Normal affect    ASSESSMENT:    1. Chronic diastolic CHF (congestive heart failure) (HCC)   2. Paroxysmal SVT (supraventricular tachycardia) ([place])   3. Morbid obesity ([place])   4. Essential hypertension   5. Pure hypercholesterolemia    PLAN:    Chronic diastolic heart failure: stable NYHA class III symptoms today. Weights improving -back on baseline regimen of furosemide 80 mg BID and metolazone 2.5 mg twice weekly (with potassium) -very motivated for weight loss -very strict on diet, daily weights, monitoring for symptoms.    Paroxysmal SVT: no recent symptoms -continue diltiazem   Type II diabetes with stage 3 CKD: on insulin and jardiance -working on diet and weight loss -ultimately planning for bariatric surgery   Hypertension:  typically well controlled, slightly elevated today -she will continue to monitor. Goal <130/80.  -continue dilitazem, diuretics, losartan   Hypercholesterolemia: continue atorvastatin, lipids rechecked in hospital 08/2018  Class 3 obesity, BMI 49: has done a great job losing weight on her own, I would fully support her  for bariatric surgery  OSA on PAP, obesity hypoventilation syndrome, no longer on O2: followed by pulmonology  Prevention and cardiac disease counseling, including heart failure education:   The 10-year ASCVD risk score Mikey Bussing DC Jr., et al., 2013) is: 18.9%   Values used to calculate the score:     Age: 72 years     Sex: Female     Is Non-Hispanic African American: Yes     Diabetic: Yes     Tobacco smoker: Yes     Systolic Blood Pressure: 408 mmHg     Is BP treated: Yes     HDL Cholesterol: 37 mg/dL     Total Cholesterol: 146 mg/dL  Plan for follow up: 6 mos  [name], MD, PhD    [facility]   Medication Adjustments/Labs  and Tests Ordered: Current medicines are reviewed at length with the patient today.  Concerns regarding medicines are outlined above.  No orders of the defined types were placed in this encounter.  No orders of the defined types were placed in this encounter.   Patient Instructions  Medication Instructions:  Your Physician recommend you continue on your current medication as directed.    *If you need a refill on your cardiac medications before your next appointment, please call your pharmacy*   Lab Work: None  Testing/Procedures: None   Follow-Up:  At [facility], you and your health needs are our priority.  As part of our continuing mission to provide you with exceptional heart care, we have created designated Provider Care Teams.  These Care Teams include your primary Cardiologist (physician) and Advanced Practice Providers (APPs -  Physician Assistants and Nurse Practitioners) who all work together to provide you with the care you need, when you need it.  We recommend signing up for the patient portal called "MyChart".  Sign up information is provided on this After Visit Summary.  MyChart is used to connect with patients for Virtual Visits (Telemedicine).  Patients are able to view lab/test results, encounter notes, upcoming appointments, etc.  Non-urgent messages can be sent to your provider as well.   To learn more about what you can do with MyChart, go to [email or web address].    Your next appointment:   6 month(s)  The format for your next appointment:   In Person  Provider:   [name], MD   Signed, [name], MD PhD 01/12/2020  [place]

## 2020-01-13 ENCOUNTER — Encounter: Payer: Self-pay | Admitting: Family Medicine

## 2020-01-13 ENCOUNTER — Ambulatory Visit (INDEPENDENT_AMBULATORY_CARE_PROVIDER_SITE_OTHER): Payer: Medicare Other | Admitting: Family Medicine

## 2020-01-13 ENCOUNTER — Other Ambulatory Visit: Payer: Self-pay

## 2020-01-13 ENCOUNTER — Telehealth: Payer: Self-pay | Admitting: Pharmacist

## 2020-01-13 VITALS — BP 132/68 | HR 59 | Ht 69.0 in | Wt 335.2 lb

## 2020-01-13 DIAGNOSIS — N1831 Chronic kidney disease, stage 3a: Secondary | ICD-10-CM | POA: Diagnosis not present

## 2020-01-13 DIAGNOSIS — N183 Chronic kidney disease, stage 3 unspecified: Secondary | ICD-10-CM

## 2020-01-13 DIAGNOSIS — Z794 Long term (current) use of insulin: Secondary | ICD-10-CM

## 2020-01-13 DIAGNOSIS — E1122 Type 2 diabetes mellitus with diabetic chronic kidney disease: Secondary | ICD-10-CM | POA: Diagnosis not present

## 2020-01-13 DIAGNOSIS — E876 Hypokalemia: Secondary | ICD-10-CM | POA: Diagnosis not present

## 2020-01-13 DIAGNOSIS — R600 Localized edema: Secondary | ICD-10-CM | POA: Diagnosis not present

## 2020-01-13 DIAGNOSIS — N2581 Secondary hyperparathyroidism of renal origin: Secondary | ICD-10-CM | POA: Diagnosis not present

## 2020-01-13 DIAGNOSIS — I129 Hypertensive chronic kidney disease with stage 1 through stage 4 chronic kidney disease, or unspecified chronic kidney disease: Secondary | ICD-10-CM | POA: Diagnosis not present

## 2020-01-13 LAB — GLUCOSE, POCT (MANUAL RESULT ENTRY): POC Glucose: 400 mg/dl — AB (ref 70–99)

## 2020-01-13 NOTE — Progress Notes (Signed)
° ° °  SUBJECTIVE:   CHIEF COMPLAINT / HPI:   DM: her fasting blood sugar read 'hi' this morning.  She spoke with Dr. Curt Bears who advised her to come in this afternoon.  She has been sleeping more. Her dizzy spells have stopped two days ago.She has dry mouth and is thirsty.  Otherwise she has not had any symptoms.  She takes diuretics daily. She also takes metolazone days a week.   She has been taking lantus 35 and humalog 12 u.  She takes ozempic weekly.  She takes jardiance daily.    Blood sugars increased to 312 on 12/6, and since then has been elevated 300-500.     PERTINENT  PMH / PSH: DM 2  OBJECTIVE:   BP 132/68    Pulse (!) 59    Ht 5\' 9"  (1.753 m)    Wt (!) 335 lb 3.2 oz (152 kg)    LMP 04/10/2006    SpO2 93%    BMI 49.50 kg/m   General: Alert, oriented.  No acute distress obese female, appears stated age. CV: Regular rate and rhythm Pulmonary: Lungs clear auscultation bilaterally EXT: No pedal edema  ASSESSMENT/PLAN:   Type 2 diabetes mellitus with stage 3 chronic kidney disease, with long-term current use of insulin (HCC) Discussed with patient and Dr. Valentina Lucks.  No explanation at this point for why her blood sugars have increased over the past few weeks but possibility of underlying infection still possible.  Patient states that she obtained labs at her nephrologist office earlier today.  Advised patient to let us know if these lab values are abnormal. -Increase Lantus 50 units a day -Increase Humalog to 16 units with meals -Advised patient to titrate down if blood sugars become too low.  Also advised her to call our clinic if she has questions about titrating. -Continue other hyperglycemic meds -Follow-up early next week with Dr. Valentina Lucks or PCP     Benay Pike, MD [place]

## 2020-01-13 NOTE — Patient Instructions (Addendum)
It was nice to see you again today,  After discussion with Dr. Valentina Lucks I think we should go up on your Lantus and Humalog.  Increase your Lantus to 50 units in the morning  Increase your Humalog to 16 units with meals.  If you ever have blood sugars that go below 100 decrease your insulin.  If you are unsure about how much to decrease it, call us first.  Please schedule appointment with the clinic for early next week preferably Monday or Tuesday.  Have a great day,  Addison Naegeli, MD

## 2020-01-13 NOTE — Assessment & Plan Note (Addendum)
Discussed with patient and Dr. Valentina Lucks.  No explanation at this point for why her blood sugars have increased over the past few weeks but possibility of underlying infection still possible.  Patient states that she obtained labs at her nephrologist office earlier today.  Advised patient to let us know if these lab values are abnormal. -Increase Lantus 50 units a day -Increase Humalog to 16 units with meals -Advised patient to titrate down if blood sugars become too low.  Also advised her to call our clinic if she has questions about titrating. -Continue other hyperglycemic meds -Follow-up early next week with Dr. Valentina Lucks or PCP

## 2020-01-13 NOTE — Telephone Encounter (Signed)
Contacted patient to follow-up on Diabetes.  She reports reading "HI" this AM.   Discussed with Dr. Owens Shark.  Patient scheduled into [facility] clinic today at 2:10 PM.  Patient verbalized "I will be there".

## 2020-01-13 NOTE — Telephone Encounter (Signed)
Noted and agree. 

## 2020-01-13 NOTE — Telephone Encounter (Signed)
-----   Message from Leavy Cella, [place] sent at 01/03/2020 10:49 AM EST ----- Regarding: tobacco cessation tx reduction and blood sugar control Tobacco Tx reduction ?  Blood sugar control

## 2020-01-14 NOTE — Telephone Encounter (Signed)
Called patient regarding vaginal pain.  She reports that the pain is resolved and she feels like something popped down there and is draining.  Denies any fever.  Reports that her blood sugars this morning were 357.  We discussed that she may need to be seen for a need for antibiotics if it was an abscess and patient reports that she will wait and see Dr. Jeannine Kitten on 12/23.  I requested that if she starts to have fevers or symptoms return she needs to call and schedule an appointment earlier or seek medical attention at the emergency department or urgent care.  Patient is agreeable to this.

## 2020-01-17 DIAGNOSIS — G894 Chronic pain syndrome: Secondary | ICD-10-CM | POA: Diagnosis not present

## 2020-01-17 DIAGNOSIS — Z79899 Other long term (current) drug therapy: Secondary | ICD-10-CM | POA: Diagnosis not present

## 2020-01-17 DIAGNOSIS — M5136 Other intervertebral disc degeneration, lumbar region: Secondary | ICD-10-CM | POA: Diagnosis not present

## 2020-01-17 DIAGNOSIS — M5030 Other cervical disc degeneration, unspecified cervical region: Secondary | ICD-10-CM | POA: Diagnosis not present

## 2020-01-19 NOTE — Progress Notes (Signed)
SUBJECTIVE:   CHIEF COMPLAINT / HPI:   Diabetes: Patient still taking Lantus 50 units a day and Humalog 16 units with meals like she was prescribed during her last visit a week ago.  Has been documenting her blood sugars and they are all still elevated in the 3-400s.  Patient believes this is partly due to her eating foods late at night but also due to eating foods that she thought were sugar-free but contain "sugar alcohol".  She states she is eliminating these things from her diet now and she states her blood sugar will be better when she sees Dr. Berna Spare in a week.  She does not want to change her Lantus at this time.  Vaginal discharge: Patient states that after her visit last Thursday she felt a sharp pain when she urinated.  This occurred 1 time.  The next day she noticed some discharge running down her thigh.  At that time she felt a bump near her labia that she and squeezed with a paper towel to help drain.  Since that time she has not noticed any drainage and has had no dysuria.  Patient said she has had "plenty of boils" before but those are usually painful.  This one was nonpainful which was unusual to her.  Lowest value was 243.    PERTINENT  PMH / PSH: Diabetes  OBJECTIVE:   BP 140/81   Pulse 92   Ht 5\' 9"  (1.753 m)   Wt (!) 338 lb 12.8 oz (153.7 kg)   LMP 04/10/2006   SpO2 94%   BMI 50.03 kg/m   General: Alert, oriented.  No acute distress. CV: Regular rate and rhythm Pulmonary: Lungs clear to auscultation bilaterally GU: Patient has what appears to be a ruptured abscess in the inguinal region posterolaterally to the labia.  Not currently bleeding.  No discharge.  No signs of infection.  Nontender to touch.  No discharge or bleeding seen on vaginal exam.  ASSESSMENT/PLAN:   Type 2 diabetes mellitus with stage 3 chronic kidney disease, with long-term current use of insulin ([place]) Patient still has elevated blood sugars that do not appear to have deferred much after  increasing her Lantus from 35-50 and Humalog from 12-16.  Her lowest value on her log sheet was 243.  Several values were in the 3-4 100s.  Today she was 300.  Likely the majority of this is due to her diet as she talked openly about eating a ham sandwich at 11:00 at night.  Patient is resistant to changing her Lantus at this time.  Given that she is going to follow-up with her PCP in less than 1 week I agreed with the patient that she can see how her glucose changes after getting rid of the sugar alcohols from her diet.  We will get a BMP to check electrolytes and for acidosis today.  Vaginal discharge Discharge appears to be due to a ruptured abscess in the inguinal region (not Bartholin cyst as it was not on the labia).  No signs of infection at this time.  Patient was swabbed with wet prep and found to have yeast as well.  Prescribed fluconazole with second dose refill if symptoms do not resolve with the first dose.  Advised patient to look for signs of infection including pain, swelling, or discharge and to let us know if she sees any of these.  Otherwise advised to clean the area with soap and water.     Garen Lah  Jeannine Kitten, [place]

## 2020-01-20 ENCOUNTER — Ambulatory Visit (INDEPENDENT_AMBULATORY_CARE_PROVIDER_SITE_OTHER): Payer: Medicare Other | Admitting: Family Medicine

## 2020-01-20 ENCOUNTER — Other Ambulatory Visit: Payer: Self-pay

## 2020-01-20 ENCOUNTER — Encounter: Payer: Self-pay | Admitting: Family Medicine

## 2020-01-20 VITALS — BP 140/81 | HR 92 | Ht 69.0 in | Wt 338.8 lb

## 2020-01-20 DIAGNOSIS — N898 Other specified noninflammatory disorders of vagina: Secondary | ICD-10-CM | POA: Insufficient documentation

## 2020-01-20 DIAGNOSIS — Z794 Long term (current) use of insulin: Secondary | ICD-10-CM | POA: Diagnosis not present

## 2020-01-20 DIAGNOSIS — N183 Chronic kidney disease, stage 3 unspecified: Secondary | ICD-10-CM | POA: Diagnosis not present

## 2020-01-20 DIAGNOSIS — E1122 Type 2 diabetes mellitus with diabetic chronic kidney disease: Secondary | ICD-10-CM | POA: Diagnosis not present

## 2020-01-20 LAB — GLUCOSE, POCT (MANUAL RESULT ENTRY): POC Glucose: 302 mg/dl — AB (ref 70–99)

## 2020-01-20 LAB — POCT WET PREP (WET MOUNT)
Clue Cells Wet Prep Whiff POC: NEGATIVE
Trichomonas Wet Prep HPF POC: ABSENT

## 2020-01-20 MED ORDER — FLUCONAZOLE 150 MG PO TABS: 150.0000 mg | ORAL_TABLET | Freq: Once | ORAL | 1 refills | Status: AC

## 2020-01-20 NOTE — Patient Instructions (Signed)
It was nice to see you again today,  For your diabetes, I would like to increase your insulin, but I understand if you would like to wait until next week when you talk with your PCP, Dr. Berna Spare.  In the meantime I will get a blood test to make sure you do not have acidosis or any other abnormalities with your kidneys.  The lesion on your groin area look like an abscess that has ruptured.  You can keep the area clean with soap and water.  If you notice any signs of infection such as pain or discharge, let us know.  You also tested positive for a yeast infection.  I have prescribed you fluconazole for you to take 1 time.  If you still have symptoms 3 days later you can get a refill from the pharmacy and take a second dose.  Please wait at least 72 hours between doses.  I have a great day,  Mary Marker, MD

## 2020-01-20 NOTE — Assessment & Plan Note (Signed)
Discharge appears to be due to a ruptured abscess in the inguinal region (not Bartholin cyst as it was not on the labia).  No signs of infection at this time.  Patient was swabbed with wet prep and found to have yeast as well.  Prescribed fluconazole with second dose refill if symptoms do not resolve with the first dose.  Advised patient to look for signs of infection including pain, swelling, or discharge and to let us know if she sees any of these.  Otherwise advised to clean the area with soap and water.

## 2020-01-20 NOTE — Assessment & Plan Note (Signed)
Patient still has elevated blood sugars that do not appear to have deferred much after increasing her Lantus from 35-50 and Humalog from 12-16.  Her lowest value on her log sheet was 243.  Several values were in the 3-4 100s.  Today she was 300.  Likely the majority of this is due to her diet as she talked openly about eating a ham sandwich at 11:00 at night.  Patient is resistant to changing her Lantus at this time.  Given that she is going to follow-up with her PCP in less than 1 week I agreed with the patient that she can see how her glucose changes after getting rid of the sugar alcohols from her diet.  We will get a BMP to check electrolytes and for acidosis today.

## 2020-01-21 LAB — BASIC METABOLIC PANEL
BUN/Creatinine Ratio: 23 (ref 9–23)
BUN: 34 mg/dL — ABNORMAL HIGH (ref 6–24)
CO2: 24 mmol/L (ref 20–29)
Calcium: 9.8 mg/dL (ref 8.7–10.2)
Chloride: 94 mmol/L — ABNORMAL LOW (ref 96–106)
Creatinine, Ser: 1.47 mg/dL — ABNORMAL HIGH (ref 0.57–1.00)
GFR calc Af Amer: 46 mL/min/{1.73_m2} — ABNORMAL LOW (ref >59)
GFR calc non Af Amer: 40 mL/min/{1.73_m2} — ABNORMAL LOW (ref >59)
Glucose: 264 mg/dL — ABNORMAL HIGH (ref 65–99)
Potassium: 3.9 mmol/L (ref 3.5–5.2)
Sodium: 139 mmol/L (ref 134–144)

## 2020-01-26 ENCOUNTER — Other Ambulatory Visit: Payer: Self-pay

## 2020-01-26 ENCOUNTER — Ambulatory Visit: Payer: Medicare Other

## 2020-01-26 ENCOUNTER — Ambulatory Visit (INDEPENDENT_AMBULATORY_CARE_PROVIDER_SITE_OTHER): Payer: Medicare Other | Admitting: Family Medicine

## 2020-01-26 ENCOUNTER — Encounter: Payer: Self-pay | Admitting: Family Medicine

## 2020-01-26 VITALS — BP 134/76 | HR 101 | Ht 69.0 in | Wt 343.6 lb

## 2020-01-26 DIAGNOSIS — N183 Chronic kidney disease, stage 3 unspecified: Secondary | ICD-10-CM | POA: Diagnosis not present

## 2020-01-26 DIAGNOSIS — Z794 Long term (current) use of insulin: Secondary | ICD-10-CM

## 2020-01-26 DIAGNOSIS — E1122 Type 2 diabetes mellitus with diabetic chronic kidney disease: Secondary | ICD-10-CM | POA: Diagnosis not present

## 2020-01-26 LAB — GLUCOSE, POCT (MANUAL RESULT ENTRY): POC Glucose: 180 mg/dl — AB (ref 70–99)

## 2020-01-26 NOTE — Progress Notes (Signed)
    SUBJECTIVE:   CHIEF COMPLAINT / HPI:   Hyperglycemia follow-up Patient reports that her blood sugars have been much better controlled.  She says that her morning blood sugars have been in the 100s-low 200s and that she has not seen any 3 or 400s.  Denies any hypoglycemic events.  Reports that she has cut out sweets such as cookies.  She is going to get back on her diet for weight loss and continue to monitor her sugars.  Reports that the groin infection has resolved and it is no longer draining.  Reports that her yeast infection is also resolved.  Currently taking 50 units of Lantus daily as well as 16 units of Humalog.  PERTINENT  PMH / PSH: Type 2 diabetes OBJECTIVE:   BP 134/76   Pulse (!) 101   Ht 5\' 9"  (1.753 m)   Wt (!) 343 lb 9.6 oz (155.9 kg)   LMP 04/10/2006   SpO2 95%   BMI 50.74 kg/m   General: Well-appearing 54 year old female, obese, no acute distress Cardiac: Regular rate and rhythm, no murmurs appreciated Respiratory: Normal work of breathing, lungs clear to auscultation bilaterally MSK: Ambulates without difficulty Psych: Reports that her depression is completely resolved and she feels great, denies SI or HI  ASSESSMENT/PLAN:   Type 2 diabetes mellitus with stage 3 chronic kidney disease, with long-term current use of insulin ([place]) Patient's blood sugars are much better controlled at this time.  Blood sugar today was 168.  Reports that she has made adjustments in her diet and denies any low blood sugars. -We will continue on current regimen of Lantus 50 units and Humalog 16 units -Follow-up after January 11 for repeat hemoglobin A1c as well as follow-up on her blood sugars -Strict hypoglycemic precautions given and patient is understanding     [name], MD [place]

## 2020-01-26 NOTE — Patient Instructions (Addendum)
Congratulations on your blood pressure things are well controlled.  I want to continue on your current regiment.  I want to see you back after January 11 so that we can recheck your hemoglobin A1c.  Continue to monitor your blood sugars.  Keep an eye out for low blood sugars given we increased your insulin so much.  I hope you have a happy new year and I will see you in January.

## 2020-01-26 NOTE — Assessment & Plan Note (Signed)
Patient's blood sugars are much better controlled at this time.  Blood sugar today was 168.  Reports that she has made adjustments in her diet and denies any low blood sugars. -We will continue on current regimen of Lantus 50 units and Humalog 16 units -Follow-up after January 11 for repeat hemoglobin A1c as well as follow-up on her blood sugars -Strict hypoglycemic precautions given and patient is understanding

## 2020-02-01 ENCOUNTER — Ambulatory Visit (INDEPENDENT_AMBULATORY_CARE_PROVIDER_SITE_OTHER): Payer: Medicare Other | Admitting: Orthopaedic Surgery

## 2020-02-01 DIAGNOSIS — G5622 Lesion of ulnar nerve, left upper limb: Secondary | ICD-10-CM

## 2020-02-01 DIAGNOSIS — M25512 Pain in left shoulder: Secondary | ICD-10-CM

## 2020-02-01 DIAGNOSIS — G8929 Other chronic pain: Secondary | ICD-10-CM | POA: Diagnosis not present

## 2020-02-01 MED ORDER — LIDOCAINE HCL 1 % IJ SOLN
3.0000 mL | INTRAMUSCULAR | Status: AC | PRN
  Administered 2020-02-01: 3 mL

## 2020-02-01 MED ORDER — BUPIVACAINE HCL 0.5 % IJ SOLN
3.0000 mL | INTRAMUSCULAR | Status: AC | PRN
  Administered 2020-02-01: 3 mL via INTRA_ARTICULAR

## 2020-02-01 MED ORDER — METHYLPREDNISOLONE ACETATE 40 MG/ML IJ SUSP
40.0000 mg | INTRAMUSCULAR | Status: AC | PRN
Start: 2020-02-01 — End: 2020-02-01
  Administered 2020-02-01: 40 mg via INTRA_ARTICULAR

## 2020-02-01 NOTE — Progress Notes (Signed)
Office Visit Note   Patient: [name]           Date of Birth: 1965-09-18           MRN: [id number] Visit Date: 02/01/2020              Requested by: [name], MD [address],  [place] [address] PCP: [name], MD   Assessment & Plan: Visit Diagnoses:  1. Chronic left shoulder pain   2. Cubital tunnel syndrome on left     Plan: Impression is chronic left shoulder pain and suspected left cubital tunnel syndrome.  Patient also is in a pain clinic as well.  For the shoulder her strength is good and I think mainly the pain is limiting the range of motion.  Subacromial injection with Toradol performed today.  I think she should get back into doing her exercises.  We will obtain nerve conduction studies to evaluate for cubital tunnel syndrome of the left arm.  Follow-up afterwards.  Follow-Up Instructions: Return if symptoms worsen or fail to improve.   Orders:  Orders Placed This Encounter  Procedures  . Ambulatory referral to Physical Medicine Rehab   No orders of the defined types were placed in this encounter.     Procedures: Large Joint Inj: L subacromial bursa on 02/01/2020 9:27 AM Indications: pain Details: 22 G needle  Arthrogram: No  Medications: 3 mL lidocaine 1 %; 3 mL bupivacaine 0.5 %; 40 mg methylPREDNISolone acetate 40 MG/ML Outcome: tolerated well, no immediate complications Patient was prepped and draped in the usual sterile fashion.       Clinical Data: No additional findings.   Subjective: Chief Complaint  Patient presents with  . Left Shoulder - Follow-up    [name] comes in today for evaluation of 2 separate issues.  She is about 46-month status post left shoulder arthroscopy with rotator cuff repair.  She states that she has finished physical therapy but still has had some pain in the superior portion of the arm as well as the lateral arm.  She has noticed some decreased range of motion as result of the pain.  She  also has numbness and tingling in the left ring and small finger.   Review of Systems  Constitutional: Negative.   HENT: Negative.   Eyes: Negative.   Respiratory: Negative.   Cardiovascular: Negative.   Endocrine: Negative.   Musculoskeletal: Negative.   Neurological: Negative.   Hematological: Negative.   Psychiatric/Behavioral: Negative.   All other systems reviewed and are negative.    Objective: Vital Signs: LMP 04/10/2006   Physical Exam Vitals and nursing note reviewed.  Constitutional:      Appearance: She is well-developed and well-nourished.  Pulmonary:     Effort: Pulmonary effort is normal.  Skin:    General: Skin is warm.     Capillary Refill: Capillary refill takes less than 2 seconds.  Neurological:     Mental Status: She is alert and oriented to person, place, and time.  Psychiatric:        Mood and Affect: Mood and affect normal.        Behavior: Behavior normal.        Thought Content: Thought content normal.        Judgment: Judgment normal.     Ortho Exam Left shoulder shows nearly full range of motion with moderate pain.  Positive empty can and Hawkins sign.  Strength is intact.  Left hand shows no  muscle atrophy.  She has subjective paresthesias to the ulnar nerve distribution.  Negative Tinel cubital tunnel.  Ulnar nerve is stable during elbow range of motion.  Specialty Comments:  No specialty comments available.  Imaging: No results found.   PMFS History: Patient Active Problem List   Diagnosis Date Noted  . Vaginal discharge 01/20/2020  . Prolapse of anterior vaginal wall 11/10/2019  . Skin ulcer of left thigh, limited to breakdown of skin ([place]) 09/09/2019  . Ulnar nerve compression 08/06/2019  . Diabetic neuropathy associated with type 2 diabetes mellitus ([place]) 08/06/2019  . Body mass index 50.0-59.9, adult ([place]) 06/12/2019  . Impingement syndrome of left shoulder 05/07/2019  . Polyp of colon   . Polyp of ascending colon   .  Rectal discomfort 02/12/2019  . Osteoarthritis of right hip 02/12/2019  . Nontraumatic tear of left supraspinatus tendon 12/30/2018  . Gout 12/17/2018  . Nontraumatic complete tear of right rotator cuff 12/08/2018  . Rotator cuff tear, right 12/01/2018  . Tobacco abuse 08/21/2018  . Right groin pain 07/20/2018  . Depressed mood 07/13/2018  . Paroxysmal SVT (supraventricular tachycardia) ([place]) 03/20/2018  . Pure hypercholesterolemia 03/20/2018  . Muscle cramping 02/25/2018  . Nodule of upper lobe of right lung 02/13/2018  . Falls, subsequent encounter 01/30/2018  . Pain in left foot 11/04/2017  . Essential hypertension 10/27/2017  . Solitary pulmonary nodule 10/07/2017  . Restrictive lung disease secondary to obesity 10/01/2017  . Polycythemia, secondary 10/01/2017  . Chronic viral hepatitis B without delta-agent ([place]) 07/08/2017  . COPD (chronic obstructive pulmonary disease) with chronic bronchitis ([place]) 06/27/2017  . Chronic kidney disease (CKD), stage IV (severe) ([place]) 01/14/2017  . Type 2 diabetes mellitus with stage 3 chronic kidney disease, with long-term current use of insulin ([place]) 12/12/2016  . Urge incontinence of urine 12/05/2016  . Trigger finger, left index finger 07/15/2016  . Back pain 04/07/2014  . Morbid obesity ([place]) 10/02/2012  . Chronic diastolic CHF (congestive heart failure) ([place]) 04/07/2012  . GERD 01/26/2010  . Obstructive sleep apnea treated with BiPAP 02/03/2008  . FIBROCYSTIC BREAST DISEASE 10/28/2006  . Hyperlipidemia 03/27/2006  . Obesity hypoventilation syndrome ([place]) 03/27/2006  . Depression with anxiety 03/27/2006   Past Medical History:  Diagnosis Date  . Alcoholism ([place])   . Anxiety   . Arthritis 04-10-11   hips, shoulders, back  . Asthma   . Bipolar disorder ([place])   . Cardiomegaly   . Carpal tunnel syndrome, bilateral   . Cervical cancer ([place]) 1993   cervical, no treatment done, went away per pt  . Cervical dysplasia or atypia 04-10-11    '93- once dx.-got pregnant-no intervention, then postpartum, no dysplasia found  . CHF (congestive heart failure) ([place])    no cardiologist 2014 dx, none now  . Chronic hypoxemic respiratory failure ([place])   . Chronic kidney disease    stage 3 kidney disease  . 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 Pathology showed Condyloma, gluteal CONDYLOMA ACUMINATUM.   [name] Kitchen COPD (chronic obstructive pulmonary disease) ([place])   . Depression   . Diabetes mellitus without complication ([place])   . Dyspnea    with actity, sitting  . Fall 12/16/2016  . Fibrocystic breast disease   . GERD (gastroesophageal reflux disease)   . Grade I diastolic dysfunction 23/76/2831   Noted on ECHO  . Hepatitis    hep B-count is low at present,doesn't register  . History of PSVT (paroxysmal supraventricular tachycardia)   .  Hypercalcemia   . Hyperlipidemia   . Hypertension   . Incomplete left bundle branch block (LBBB) 09/24/2018   Noted on EKG  . Lung nodule 11/2017   right and left lung  . Migraine headache    none recent  . Morbid obesity ([place]) 03/27/2006  . Neuropathy   . Pneumonia    walking pneu 15 yrs. ago  . Skin lesion 03/15/2011   In gluteal crease now s/p removal by Dr. [name] of General Surgery on 3/12. Path shows condyloma.     . Sleep apnea 04-10-11   uses cpap, pt does not know settings  . Trigger finger    left third  . Urge incontinence of urine     Family History  Problem Relation Age of Onset  . Pulmonary embolism Mother   . Stroke Father   . Alcohol abuse Father   . Pneumonia Father   . Hypertension Father   . Breast cancer Maternal Aunt   . Hypertension Other   . Diabetes Other        Aunts and cousins  . Asthma Sister   . Depression Sister   . Arthritis Sister   . Sickle cell anemia Son     Past Surgical History:  Procedure Laterality Date  . ANAL FISTULECTOMY  04/17/2011   Procedure: FISTULECTOMY ANAL;  Surgeon: Imogene Burn. [name], MD;  Location: [facility];  Service: General;  Laterality: N/A;  Excision of Condyloma Gluteal Cleft   . ANAL RECTAL MANOMETRY N/A 03/26/2019   Procedure: ANO RECTAL MANOMETRY;  Surgeon: Thornton Park, MD;  Location: [facility];  Service: Gastroenterology;  Laterality: N/A;  . BIOPSY  03/09/2019   Procedure: BIOPSY;  Surgeon: Thornton Park, MD;  Location: [facility];  Service: Gastroenterology;;  . Lum Keas INJECTION N/A 12/29/2018   Procedure: BOTOX INJECTION(100 UNITS)  WITH CYSTOSCOPY;  Surgeon: Ceasar Mons, MD;  Location: [facility];  Service: Urology;  Laterality: N/A;  . BOTOX INJECTION N/A 07/27/2019   Procedure: BOTOX INJECTION WITH CYSTOSCOPY;  Surgeon: Ceasar Mons, MD;  Location: [facility];  Service: Urology;  Laterality: N/A;  ONLY NEEDS 30 MIN  . BREAST CYST EXCISION     bilateral breast, 3 cysts removed from each breast  . BREAST EXCISIONAL BIOPSY Right    x 3  . BREAST EXCISIONAL BIOPSY Left    x 3  . CARPAL TUNNEL RELEASE Bilateral   . Cervical biospy    . CERVICAL FUSION  04-10-11   x3- cervical fusion with plating and screws-Dr. [name]  . COLONOSCOPY WITH PROPOFOL N/A 07/06/2015   Procedure: COLONOSCOPY WITH PROPOFOL;  Surgeon: [name], MD;  [facility];  Service: Endoscopy;  Laterality: N/A;  . COLONOSCOPY WITH PROPOFOL N/A 03/09/2019   Procedure: COLONOSCOPY WITH PROPOFOL;  Surgeon: Thornton Park, MD;  [facility];  Service: Gastroenterology;  Laterality: N/A;  . DOPPLER ECHOCARDIOGRAPHY  04/06/2012   AT [facility] 55-60%  . FOOT SURGERY Left 2018  . FRACTURE SURGERY     little toe left foot  . I & D EXTREMITY Left 12/18/2016   Procedure: IRRIGATION AND DEBRIDEMENT LEFT FOOT, CLOSURE;  Surgeon: [name], MD;  Location: [facility];  Service: Orthopedics;  Laterality: Left;  . [place] SURGERY  04-10-11   x5-Lumbar fusion-retained hardware.(Dr. Louanne Skye)  . MULTIPLE TOOTH EXTRACTIONS    . POLYPECTOMY  03/09/2019   Procedure:  POLYPECTOMY;  Surgeon: Thornton Park, MD;  [facility];  Service: Gastroenterology;;  . Langston Masker  ARTHROSCOPY WITH ROTATOR CUFF REPAIR AND SUBACROMIAL DECOMPRESSION Left 05/07/2019   Procedure: LEFT SHOULDER ARTHROSCOPY WITH DEBRIDEMENT, DISTAL CLAVICLE EXCISION,  SUBACROMIAL DECOMPRESSION AND POSSIBLE ROTATOR CUFF REPAIR;  Surgeon: [name], MD;  Location: [facility];  Service: Orthopedics;  Laterality: Left;  . TEE WITHOUT CARDIOVERSION N/A 04/06/2012   Procedure: TRANSESOPHAGEAL ECHOCARDIOGRAM (TEE);  Surgeon: [name], MD;  Location: [facility];  Service: Cardiovascular;  Laterality: N/A;  . TRIGGER FINGER RELEASE Right    middle finger  . TRIGGER FINGER RELEASE Left 06/28/2016   Procedure: RELEASE TRIGGER FINGER LEFT 3RD FINGER;  Surgeon: [name], MD;  Location: [facility];  Service: Orthopedics;  Laterality: Left;  . TRIGGER FINGER RELEASE Right 06/12/2016   Procedure: RIGHT INDEX FINGER TRIGGER RELEASE;  Surgeon: [name], MD;  Location: [facility];  Service: Orthopedics;  Laterality: Right;  . TRIGGER FINGER RELEASE Right 01/19/2018   Procedure: RIGHT RING FINGER TRIGGER FINGER RELEASE;  Surgeon: [name], MD;  Location: [facility];  Service: Orthopedics;  Laterality: Right;  . TRIGGER FINGER RELEASE Left 05/07/2019   Procedure: RELEASE TRIGGER FINGER LEFT INDEX FINGER;  Surgeon: [name], MD;  Location: [facility];  Service: Orthopedics;  Laterality: Left;   Social History   Occupational History  . Occupation: disabled    Comment: Disabled  Tobacco Use  . Smoking status: Former Smoker    Packs/day: 0.20    Years: 40.00    Pack years: 8.00    Types: Cigarettes    Start date: 01/28/1977    Quit date: 07/25/2019    Years since quitting: 0.5  . Smokeless tobacco: Never Used  . Tobacco comment: 4-5 cigarettes per day  Vaping Use  . Vaping Use: Never used  Substance and Sexual Activity  . Alcohol use: No    Alcohol/week: 0.0 standard drinks  . Drug use: Not  Currently    Types: Marijuana, "Crack" cocaine    Comment: hx marijuana usemany years ago, Crack -none since 11/2015  . Sexual activity: Not Currently    Partners: Male    Birth control/protection: Post-menopausal

## 2020-02-02 ENCOUNTER — Ambulatory Visit (INDEPENDENT_AMBULATORY_CARE_PROVIDER_SITE_OTHER): Payer: Medicare Other | Admitting: Specialist

## 2020-02-02 ENCOUNTER — Encounter: Payer: Self-pay | Admitting: Specialist

## 2020-02-02 VITALS — BP 144/91 | HR 102 | Ht 69.0 in | Wt 344.0 lb

## 2020-02-02 DIAGNOSIS — M48062 Spinal stenosis, lumbar region with neurogenic claudication: Secondary | ICD-10-CM | POA: Diagnosis not present

## 2020-02-02 DIAGNOSIS — M4804 Spinal stenosis, thoracic region: Secondary | ICD-10-CM

## 2020-02-02 DIAGNOSIS — Z981 Arthrodesis status: Secondary | ICD-10-CM | POA: Diagnosis not present

## 2020-02-02 DIAGNOSIS — Z6841 Body Mass Index (BMI) 40.0 and over, adult: Secondary | ICD-10-CM | POA: Diagnosis not present

## 2020-02-02 NOTE — Progress Notes (Signed)
Office Visit Note   Patient: SOLENE HEREFORD           Date of Birth: 1966-01-28           MRN: [id number] Visit Date: 02/02/2020              Requested by: [name], MD [address],  [place] [address] PCP: [name], MD   Assessment & Plan: Visit Diagnoses:  1. Body mass index 50.0-59.9, adult ([place])   2. Morbid obesity ([place])   3. Spinal stenosis of lumbar region with neurogenic claudication   4. S/P lumbar fusion   5. Spinal stenosis, thoracic     Plan: Avoid bending, stooping and avoid lifting weights greater than 10 lbs. Avoid prolong standing and walking. Avoid frequent bending and stooping  No lifting greater than 10 lbs. May use ice or moist heat for pain. Weight loss is of benefit. Handicap license is approved. Dr. Romona Curls secretary/Assistant will call to arrange for epidural steroid injection  Continue with your excellent job of getting your weight down, you are doing an excellent job. With weight reduction a surgical solution for the upper lumbar spine and lower thoracic spine becomes possible. Will see you after the ESI by Dr. Ernestina Patches.   Follow-Up Instructions: No follow-ups on file.   Orders:  Orders Placed This Encounter  Procedures  . Ambulatory referral to Physical Medicine Rehab   No orders of the defined types were placed in this encounter.     Procedures: No procedures performed   Clinical Data: No additional findings.   Subjective: Chief Complaint  Patient presents with  . Lower Back - Pain    55 year old female with history of lumbar fusion surgery for DDD over 10 years ago, she is on a weight reduction program and has lost nearly 100 lbs and is planning to undergo a grastric sleeve procedure. She has had significant improvement in her oxygenation and COPD. She was 417 lbs one year ago. She is in a very good frame of mind for weight loss. She is using pool exercise but had to stop recently due to pain into her  right lower back, right buttock and right posterior thigh to the knee. No bowel or bladder change. She has had botox for her  Urinary bladder. Bowel are fine. Unable to ambulate due to right hip and right knee pain. Requesting injection for the lumbar spine to assist  Her ability to stand and walk.   Review of Systems  Constitutional: Negative.   HENT: Negative.   Eyes: Negative.   Respiratory: Negative.   Cardiovascular: Negative.   Gastrointestinal: Negative.   Endocrine: Negative.   Genitourinary: Negative.   Musculoskeletal: Negative.   Skin: Negative.   Allergic/Immunologic: Negative.   Neurological: Negative.   Hematological: Negative.   Psychiatric/Behavioral: Negative.      Objective: Vital Signs: BP (!) 144/91 (BP Location: Left Arm, Patient Position: Sitting)   Pulse (!) 102   Ht 5\' 9"  (1.753 m)   Wt (!) 344 lb (156 kg)   LMP 04/10/2006   BMI 50.80 kg/m   Physical Exam Constitutional:      Appearance: She is well-developed and well-nourished.  HENT:     Head: Normocephalic and atraumatic.  Eyes:     Extraocular Movements: EOM normal.     Pupils: Pupils are equal, round, and reactive to light.  Pulmonary:     Effort: Pulmonary effort is normal.     Breath sounds:  Normal breath sounds.  Abdominal:     General: Bowel sounds are normal.     Palpations: Abdomen is soft.  Musculoskeletal:     Cervical back: Normal range of motion and neck supple.     Lumbar back: Negative right straight leg raise test and negative left straight leg raise test.  Skin:    General: Skin is warm and dry.  Neurological:     Mental Status: She is alert and oriented to person, place, and time.  Psychiatric:        Mood and Affect: Mood and affect normal.        Behavior: Behavior normal.        Thought Content: Thought content normal.        Judgment: Judgment normal.     Back Exam   Tenderness  The patient is experiencing tenderness in the lumbar.  Range of Motion   Extension: abnormal  Flexion: abnormal  Lateral bend right: abnormal  Lateral bend left: abnormal  Rotation right: abnormal  Rotation left: abnormal   Muscle Strength  Right Quadriceps:  5/5  Left Quadriceps:  5/5  Right Hamstrings:  5/5  Left Hamstrings:  5/5   Tests  Straight leg raise right: negative Straight leg raise left: negative  Reflexes  Patellar: 1/4 Achilles: 1/4  Other  Toe walk: normal Sensation: normal Gait: normal       Specialty Comments:  No specialty comments available.  Imaging: No results found.   PMFS History: Patient Active Problem List   Diagnosis Date Noted  . Vaginal discharge 01/20/2020  . Prolapse of anterior vaginal wall 11/10/2019  . Skin ulcer of left thigh, limited to breakdown of skin ([place]) 09/09/2019  . Ulnar nerve compression 08/06/2019  . Diabetic neuropathy associated with type 2 diabetes mellitus ([place]) 08/06/2019  . Body mass index 50.0-59.9, adult ([place]) 06/12/2019  . Impingement syndrome of left shoulder 05/07/2019  . Polyp of colon   . Polyp of ascending colon   . Rectal discomfort 02/12/2019  . Osteoarthritis of right hip 02/12/2019  . Nontraumatic tear of left supraspinatus tendon 12/30/2018  . Gout 12/17/2018  . Nontraumatic complete tear of right rotator cuff 12/08/2018  . Rotator cuff tear, right 12/01/2018  . Tobacco abuse 08/21/2018  . Right groin pain 07/20/2018  . Depressed mood 07/13/2018  . Paroxysmal SVT (supraventricular tachycardia) ([place]) 03/20/2018  . Pure hypercholesterolemia 03/20/2018  . Muscle cramping 02/25/2018  . Nodule of upper lobe of right lung 02/13/2018  . Falls, subsequent encounter 01/30/2018  . Pain in left foot 11/04/2017  . Essential hypertension 10/27/2017  . Solitary pulmonary nodule 10/07/2017  . Restrictive lung disease secondary to obesity 10/01/2017  . Polycythemia, secondary 10/01/2017  . Chronic viral hepatitis B without delta-agent ([place]) 07/08/2017  . COPD (chronic  obstructive pulmonary disease) with chronic bronchitis ([place]) 06/27/2017  . Chronic kidney disease (CKD), stage IV (severe) ([place]) 01/14/2017  . Type 2 diabetes mellitus with stage 3 chronic kidney disease, with long-term current use of insulin ([place]) 12/12/2016  . Urge incontinence of urine 12/05/2016  . Trigger finger, left index finger 07/15/2016  . Back pain 04/07/2014  . Morbid obesity ([place]) 10/02/2012  . Chronic diastolic CHF (congestive heart failure) ([place]) 04/07/2012  . GERD 01/26/2010  . Obstructive sleep apnea treated with BiPAP 02/03/2008  . FIBROCYSTIC BREAST DISEASE 10/28/2006  . Hyperlipidemia 03/27/2006  . Obesity hypoventilation syndrome ([place]) 03/27/2006  . Depression with anxiety 03/27/2006   Past Medical History:  Diagnosis Date  .  Alcoholism ([place])   . Anxiety   . Arthritis 04-10-11   hips, shoulders, back  . Asthma   . Bipolar disorder ([place])   . Cardiomegaly   . Carpal tunnel syndrome, bilateral   . Cervical cancer ([place]) 1993   cervical, no treatment done, went away per pt  . Cervical dysplasia or atypia 04-10-11   '93- once dx.-got pregnant-no intervention, then postpartum, no dysplasia found  . CHF (congestive heart failure) ([place])    no cardiologist 2014 dx, none now  . Chronic hypoxemic respiratory failure ([place])   . Chronic kidney disease    stage 3 kidney disease  . 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 Pathology showed Condyloma, gluteal CONDYLOMA ACUMINATUM.   [name] Kitchen COPD (chronic obstructive pulmonary disease) ([place])   . Depression   . Diabetes mellitus without complication ([place])   . Dyspnea    with actity, sitting  . Fall 12/16/2016  . Fibrocystic breast disease   . GERD (gastroesophageal reflux disease)   . Grade I diastolic dysfunction 64/40/3474   Noted on ECHO  . Hepatitis    hep B-count is low at present,doesn't register  . History of PSVT (paroxysmal supraventricular tachycardia)   . Hypercalcemia   . Hyperlipidemia    . Hypertension   . Incomplete left bundle branch block (LBBB) 09/24/2018   Noted on EKG  . Lung nodule 11/2017   right and left lung  . Migraine headache    none recent  . Morbid obesity ([place]) 03/27/2006  . Neuropathy   . Pneumonia    walking pneu 15 yrs. ago  . Skin lesion 03/15/2011   In gluteal crease now s/p removal by Dr. [name] of General Surgery on 3/12. Path shows condyloma.     . Sleep apnea 04-10-11   uses cpap, pt does not know settings  . Trigger finger    left third  . Urge incontinence of urine     Family History  Problem Relation Age of Onset  . Pulmonary embolism Mother   . Stroke Father   . Alcohol abuse Father   . Pneumonia Father   . Hypertension Father   . Breast cancer Maternal Aunt   . Hypertension Other   . Diabetes Other        Aunts and cousins  . Asthma Sister   . Depression Sister   . Arthritis Sister   . Sickle cell anemia Son     Past Surgical History:  Procedure Laterality Date  . ANAL FISTULECTOMY  04/17/2011   Procedure: FISTULECTOMY ANAL;  Surgeon: Imogene Burn. [name], MD;  Location: [facility];  Service: General;  Laterality: N/A;  Excision of Condyloma Gluteal Cleft   . ANAL RECTAL MANOMETRY N/A 03/26/2019   Procedure: ANO RECTAL MANOMETRY;  Surgeon: Thornton Park, MD;  Location: [facility];  Service: Gastroenterology;  Laterality: N/A;  . BIOPSY  03/09/2019   Procedure: BIOPSY;  Surgeon: Thornton Park, MD;  Location: [facility];  Service: Gastroenterology;;  . Lum [place] INJECTION N/A 12/29/2018   Procedure: BOTOX INJECTION(100 UNITS)  WITH CYSTOSCOPY;  Surgeon: Ceasar Mons, MD;  Location: [facility];  Service: Urology;  Laterality: N/A;  . BOTOX INJECTION N/A 07/27/2019   Procedure: BOTOX INJECTION WITH CYSTOSCOPY;  Surgeon: Ceasar Mons, MD;  Location: [facility];  Service: Urology;  Laterality: N/A;  ONLY NEEDS 30 MIN  . BREAST CYST EXCISION     bilateral breast, 3 cysts removed from each breast  . BREAST EXCISIONAL  BIOPSY Right    x 3  . BREAST EXCISIONAL BIOPSY Left    x 3  . CARPAL TUNNEL RELEASE Bilateral   . Cervical biospy    . CERVICAL FUSION  04-10-11   x3- cervical fusion with plating and screws-Dr. [name]  . COLONOSCOPY WITH PROPOFOL N/A 07/06/2015   Procedure: COLONOSCOPY WITH PROPOFOL;  Surgeon: [name], MD;  [facility];  Service: Endoscopy;  Laterality: N/A;  . COLONOSCOPY WITH PROPOFOL N/A 03/09/2019   Procedure: COLONOSCOPY WITH PROPOFOL;  Surgeon: Thornton Park, MD;  [facility];  Service: Gastroenterology;  Laterality: N/A;  . DOPPLER ECHOCARDIOGRAPHY  04/06/2012   AT [place] 55-60%  . FOOT SURGERY Left 2018  . FRACTURE SURGERY     little toe left foot  . I & D EXTREMITY Left 12/18/2016   Procedure: IRRIGATION AND DEBRIDEMENT LEFT FOOT, CLOSURE;  Surgeon: [name], MD;  Location: [facility];  Service: Orthopedics;  Laterality: Left;  . [place] SURGERY  04-10-11   x5-Lumbar fusion-retained hardware.(Dr. Louanne Skye)  . MULTIPLE TOOTH EXTRACTIONS    . POLYPECTOMY  03/09/2019   Procedure: POLYPECTOMY;  Surgeon: Thornton Park, MD;  [facility];  Service: Gastroenterology;;  . SHOULDER ARTHROSCOPY WITH ROTATOR CUFF REPAIR AND SUBACROMIAL DECOMPRESSION Left 05/07/2019   Procedure: LEFT SHOULDER ARTHROSCOPY WITH DEBRIDEMENT, DISTAL CLAVICLE EXCISION,  SUBACROMIAL DECOMPRESSION AND POSSIBLE ROTATOR CUFF REPAIR;  Surgeon: [name], MD;  Location: [facility];  Service: Orthopedics;  Laterality: Left;  . TEE WITHOUT CARDIOVERSION N/A 04/06/2012   Procedure: TRANSESOPHAGEAL ECHOCARDIOGRAM (TEE);  Surgeon: [name], MD;  Location: [facility];  Service: Cardiovascular;  Laterality: N/A;  . TRIGGER FINGER RELEASE Right    middle finger  . TRIGGER FINGER RELEASE Left 06/28/2016   Procedure: RELEASE TRIGGER FINGER LEFT 3RD FINGER;  Surgeon: [name], MD;  Location: [facility];  Service: Orthopedics;  Laterality: Left;  . TRIGGER FINGER RELEASE  Right 06/12/2016   Procedure: RIGHT INDEX FINGER TRIGGER RELEASE;  Surgeon: [name], MD;  Location: [facility];  Service: Orthopedics;  Laterality: Right;  . TRIGGER FINGER RELEASE Right 01/19/2018   Procedure: RIGHT RING FINGER TRIGGER FINGER RELEASE;  Surgeon: [name], MD;  Location: [facility];  Service: Orthopedics;  Laterality: Right;  . TRIGGER FINGER RELEASE Left 05/07/2019   Procedure: RELEASE TRIGGER FINGER LEFT INDEX FINGER;  Surgeon: [name], MD;  Location: [facility];  Service: Orthopedics;  Laterality: Left;   Social History   Occupational History  . Occupation: disabled    Comment: Disabled  Tobacco Use  . Smoking status: Former Smoker    Packs/day: 0.20    Years: 40.00    Pack years: 8.00    Types: Cigarettes    Start date: 01/28/1977    Quit date: 07/25/2019    Years since quitting: 0.5  . Smokeless tobacco: Never Used  . Tobacco comment: 4-5 cigarettes per day  Vaping Use  . Vaping Use: Never used  Substance and Sexual Activity  . Alcohol use: No    Alcohol/week: 0.0 standard drinks  . Drug use: Not Currently    Types: Marijuana, "Crack" cocaine    Comment: hx marijuana usemany years ago, Crack -none since 11/2015  . Sexual activity: Not Currently    Partners: Male    Birth control/protection: Post-menopausal

## 2020-02-02 NOTE — Patient Instructions (Signed)
Avoid bending, stooping and avoid lifting weights greater than 10 lbs. Avoid prolong standing and walking. Avoid frequent bending and stooping  No lifting greater than 10 lbs. May use ice or moist heat for pain. Weight loss is of benefit. Handicap license is approved. Dr. Romona Curls secretary/Assistant will call to arrange for epidural steroid injection  Continue with your excellent job of getting your weight down, you are doing an excellent job. With weight reduction a surgical solution for the upper lumbar spine and lower thoracic spine becomes possible. Will see you after the ESI by Dr. Ernestina Patches.

## 2020-02-03 ENCOUNTER — Other Ambulatory Visit: Payer: Self-pay | Admitting: Family Medicine

## 2020-02-03 ENCOUNTER — Encounter: Payer: Self-pay | Admitting: Family Medicine

## 2020-02-03 DIAGNOSIS — Z794 Long term (current) use of insulin: Secondary | ICD-10-CM

## 2020-02-03 DIAGNOSIS — N183 Chronic kidney disease, stage 3 unspecified: Secondary | ICD-10-CM

## 2020-02-03 DIAGNOSIS — E1122 Type 2 diabetes mellitus with diabetic chronic kidney disease: Secondary | ICD-10-CM

## 2020-02-03 MED ORDER — LANTUS SOLOSTAR 100 UNIT/ML ~~LOC~~ SOPN
50.0000 [IU] | PEN_INJECTOR | Freq: Every day | SUBCUTANEOUS | 3 refills | Status: DC
Start: 2020-02-03 — End: 2020-05-10

## 2020-02-04 ENCOUNTER — Telehealth: Payer: Self-pay

## 2020-02-04 NOTE — Telephone Encounter (Signed)
Patient called she wants to schedule a appointment with Dr.Newton PER NITKA.  [phone]

## 2020-02-04 NOTE — Telephone Encounter (Signed)
Called pt and sch. 

## 2020-02-06 ENCOUNTER — Encounter: Payer: Self-pay | Admitting: Specialist

## 2020-02-06 ENCOUNTER — Other Ambulatory Visit: Payer: Self-pay | Admitting: Family Medicine

## 2020-02-06 ENCOUNTER — Other Ambulatory Visit: Payer: Self-pay | Admitting: Podiatry

## 2020-02-06 DIAGNOSIS — B353 Tinea pedis: Secondary | ICD-10-CM

## 2020-02-06 DIAGNOSIS — E1151 Type 2 diabetes mellitus with diabetic peripheral angiopathy without gangrene: Secondary | ICD-10-CM

## 2020-02-06 NOTE — Telephone Encounter (Signed)
Please advise 

## 2020-02-07 ENCOUNTER — Ambulatory Visit (INDEPENDENT_AMBULATORY_CARE_PROVIDER_SITE_OTHER): Payer: 59 | Admitting: Psychology

## 2020-02-07 DIAGNOSIS — F509 Eating disorder, unspecified: Secondary | ICD-10-CM

## 2020-02-09 ENCOUNTER — Ambulatory Visit: Payer: Medicaid Other | Admitting: Family Medicine

## 2020-02-09 DIAGNOSIS — F509 Eating disorder, unspecified: Secondary | ICD-10-CM | POA: Diagnosis not present

## 2020-02-10 ENCOUNTER — Encounter: Payer: Medicare Other | Attending: General Surgery | Admitting: Skilled Nursing Facility1

## 2020-02-10 ENCOUNTER — Ambulatory Visit: Payer: Self-pay

## 2020-02-10 ENCOUNTER — Other Ambulatory Visit: Payer: Self-pay

## 2020-02-10 ENCOUNTER — Ambulatory Visit (INDEPENDENT_AMBULATORY_CARE_PROVIDER_SITE_OTHER): Payer: Medicare Other | Admitting: Family Medicine

## 2020-02-10 ENCOUNTER — Encounter: Payer: Self-pay | Admitting: Family Medicine

## 2020-02-10 DIAGNOSIS — E1122 Type 2 diabetes mellitus with diabetic chronic kidney disease: Secondary | ICD-10-CM | POA: Diagnosis present

## 2020-02-10 DIAGNOSIS — N183 Chronic kidney disease, stage 3 unspecified: Secondary | ICD-10-CM | POA: Insufficient documentation

## 2020-02-10 DIAGNOSIS — Z794 Long term (current) use of insulin: Secondary | ICD-10-CM

## 2020-02-10 DIAGNOSIS — E669 Obesity, unspecified: Secondary | ICD-10-CM | POA: Diagnosis present

## 2020-02-10 DIAGNOSIS — M1611 Unilateral primary osteoarthritis, right hip: Secondary | ICD-10-CM

## 2020-02-10 DIAGNOSIS — N184 Chronic kidney disease, stage 4 (severe): Secondary | ICD-10-CM | POA: Diagnosis not present

## 2020-02-10 NOTE — Progress Notes (Signed)
Subjective: Patient is here for ultrasound-guided intra-articular right hip injection.   Injection a few months ago helped.  She is continuing with weight loss per bariatric clinic, hoping to have bariatric surgery in the near future.  She has lost about 84 pounds since January 2020.  Objective: Pain in the right hip with passive internal rotation, very limited range of motion.  Procedure: Ultrasound guided injection is preferred based studies that show increased duration, increased effect, greater accuracy, decreased procedural pain, increased response rate, and decreased cost with ultrasound guided versus blind injection.   Verbal informed consent obtained.  Time-out conducted.  Noted no overlying erythema, induration, or other signs of local infection. Ultrasound-guided right hip injection: After sterile prep with Betadine, injected 4 cc 0.25% bupivacaine without epinephrine and 6 mg betamethasone using a 22-gauge spinal needle, passing the needle through the iliofemoral ligament into the femoral head/neck junction.  Injectate seen filling the joint capsule.

## 2020-02-10 NOTE — Progress Notes (Signed)
Medical Nutrition Therapy  Referral stated Supervised Weight Loss (SWL) visits needed: pt states [facility] advised her she needs 3 more SWL. 3 out of 3   Planned surgery: Sleeve Gastrectomy  Pt expectation of surgery: to lose weight Pt expectation of dietitian: to guide  Nutrition Clearance: Pt seems prepared for surgery and understands the changes neccessary to be successful as represented by the changes she has already accomplished  NUTRITION ASSESSMENT   Anthropometrics  Start weight at [facility]: 405.6 lbs (date: 01/15/2018)  Wt today: 328 pounds  Height: 68.5 in BMI: 49.21 kg/m2     Clinical  Medical hx: CHF, OSA, COPD, GERD, Diabetes, CKD, hyperlipidemia, GOUT Medications: fast and long acting insulin  Labs: WBC 13, RBC 6.59, Hemoglobin 18.7, HCT 54.7,  K 3.2, BUN 32, Creatinine 1.48, GFR 46, A1C 9.3 Notable signs/symptoms:  Any previous deficiencies? No  Micronutrient Nutrition Focused Physical Exam: Hair: No issues observed Eyes: No issues observed Mouth: No issues observed Neck: No issues observed Nails: No issues observed Skin: No issues observed  Body Composition Scale 10/21/2019  Current Body Weight 351.7  Total Body Fat % 50.4  Visceral Fat 23  Fat-Free Mass % 49.5   Total Body Water % 39.2  Muscle-Mass lbs 35.8  BMI 50.6  Body Fat Displacement          Torso  lbs 110.3         Left Leg  lbs 22         Right Leg  lbs 22         Left Arm  lbs 11         Right Arm   lbs 11    Lifestyle & Dietary Hx  Pt states her blood sugars have been about 150. Pt had some questions about sugar free cookies which dietitian answered to her satisfaction. Pt states she has decreased her cheese consumption no longer putting it in her salad. Pt states she needs some sugar free options to take the sugar want out her mouth stating she has trouble with serving size limitations for sugar free stuff too.    24-Hr Dietary Recall: continued First Meal: fruit + protein shake Snack:  fruit Second Meal: fruit + lettuce salad + smoked [place] Snack: [place] sausage stick or peanut butter crackers Third Meal: fruit + lettuce salad + smoked [place] Snack: fruit or honey graham crackers + peanut butter Beverages: slim fast, powerade zero, diet gingerale, water + flavoring, sugar free natures twist   Estimated Energy Needs Calories: 1600  NUTRITION DIAGNOSIS  Overweight/obesity ([place] II-3.3) related to past poor dietary habits and physical inactivity as evidenced by patient w/ planned Sleeve Gastrectomy surgery following dietary guidelines for continued weight loss.    NUTRITION INTERVENTION  Nutrition counseling (C-1) and education (E-2) to facilitate bariatric surgery goals.   Pre-Op Goals Reviewed with the Patient -Keep up the great work! -Avoid sugar free cookies they still have carbohydrates    Handouts Provided Include in the past . Bariatric Surgery handouts (Nutrition Visits, Pre-Op Goals, Protein Shakes, Vitamins & Minerals)  Learning Style & Readiness for Change Teaching method utilized: Visual & Auditory  Demonstrated degree of understanding via: Teach Back  Barriers to learning/adherence to lifestyle change: none identified      MONITORING & EVALUATION Dietary intake, weekly physical activity, body weight, and pre-op goals reached at next nutrition visit.    Next Steps  Patient is to follow up at [facility] for Pre-Op Class >2 weeks before surgery for further  nutrition education.

## 2020-02-12 ENCOUNTER — Other Ambulatory Visit: Payer: Self-pay | Admitting: Cardiology

## 2020-02-12 ENCOUNTER — Other Ambulatory Visit: Payer: Self-pay | Admitting: Podiatry

## 2020-02-12 DIAGNOSIS — E8770 Fluid overload, unspecified: Secondary | ICD-10-CM

## 2020-02-12 DIAGNOSIS — I5022 Chronic systolic (congestive) heart failure: Secondary | ICD-10-CM

## 2020-02-12 DIAGNOSIS — E1151 Type 2 diabetes mellitus with diabetic peripheral angiopathy without gangrene: Secondary | ICD-10-CM

## 2020-02-12 DIAGNOSIS — B353 Tinea pedis: Secondary | ICD-10-CM

## 2020-02-12 DIAGNOSIS — R600 Localized edema: Secondary | ICD-10-CM

## 2020-02-12 NOTE — Telephone Encounter (Signed)
Please advise 

## 2020-02-16 ENCOUNTER — Telehealth: Payer: Self-pay | Admitting: Psychology

## 2020-02-17 ENCOUNTER — Encounter: Payer: Self-pay | Admitting: Psychology

## 2020-02-18 ENCOUNTER — Encounter: Payer: Self-pay | Admitting: Psychology

## 2020-02-21 ENCOUNTER — Ambulatory Visit (INDEPENDENT_AMBULATORY_CARE_PROVIDER_SITE_OTHER): Payer: 59 | Admitting: Psychology

## 2020-02-21 DIAGNOSIS — F509 Eating disorder, unspecified: Secondary | ICD-10-CM

## 2020-02-23 ENCOUNTER — Ambulatory Visit: Payer: Medicaid Other | Admitting: Specialist

## 2020-02-25 ENCOUNTER — Ambulatory Visit: Payer: Medicaid Other | Admitting: Family Medicine

## 2020-02-25 ENCOUNTER — Encounter: Payer: Self-pay | Admitting: Family Medicine

## 2020-02-28 ENCOUNTER — Encounter: Payer: Self-pay | Admitting: Specialist

## 2020-02-28 ENCOUNTER — Other Ambulatory Visit: Payer: Self-pay

## 2020-02-28 ENCOUNTER — Encounter: Payer: Self-pay | Admitting: Physical Medicine and Rehabilitation

## 2020-02-28 ENCOUNTER — Ambulatory Visit (INDEPENDENT_AMBULATORY_CARE_PROVIDER_SITE_OTHER): Payer: Medicaid Other | Admitting: Physical Medicine and Rehabilitation

## 2020-02-28 VITALS — BP 135/85 | HR 85

## 2020-02-28 DIAGNOSIS — G8929 Other chronic pain: Secondary | ICD-10-CM

## 2020-02-28 DIAGNOSIS — M5441 Lumbago with sciatica, right side: Secondary | ICD-10-CM

## 2020-02-28 DIAGNOSIS — M5442 Lumbago with sciatica, left side: Secondary | ICD-10-CM

## 2020-02-28 DIAGNOSIS — M961 Postlaminectomy syndrome, not elsewhere classified: Secondary | ICD-10-CM

## 2020-02-28 NOTE — Progress Notes (Signed)
Patient left without being seen. Scheduled for electrodiagnostic study tomorrow.  Pt state it she lower back that gives her problems. Pt state walking and standing makes the pain worse. Pt state she take pain meds to help ease the pain.  Numeric Pain Rating Scale and Functional Assessment Average Pain 10   In the last MONTH (on 0-10 scale) has pain interfered with the following?  1. General activity like being  able to carry out your everyday physical activities such as walking, climbing stairs, carrying groceries, or moving a chair?  Rating(10)   -Driver, -BT, -Dye Allergies.

## 2020-02-29 ENCOUNTER — Ambulatory Visit: Payer: Medicaid Other

## 2020-02-29 ENCOUNTER — Telehealth: Payer: Self-pay | Admitting: Physical Medicine and Rehabilitation

## 2020-02-29 ENCOUNTER — Encounter: Payer: Medicaid Other | Admitting: Physical Medicine and Rehabilitation

## 2020-02-29 ENCOUNTER — Ambulatory Visit: Payer: Medicare Other

## 2020-02-29 NOTE — Telephone Encounter (Signed)
Called pt and resch appts.

## 2020-02-29 NOTE — Telephone Encounter (Signed)
Patient call requesting to reschedule her appt. She also stated she needed to add on her appt she would like an injection. Please call patient about this matter. Patient phone number is [phone].

## 2020-03-03 ENCOUNTER — Other Ambulatory Visit: Payer: Medicaid Other

## 2020-03-04 ENCOUNTER — Other Ambulatory Visit: Payer: Self-pay | Admitting: Family Medicine

## 2020-03-08 ENCOUNTER — Other Ambulatory Visit: Payer: Self-pay | Admitting: Pulmonary Disease

## 2020-03-08 ENCOUNTER — Encounter: Payer: Self-pay | Admitting: Family Medicine

## 2020-03-08 MED ORDER — TRELEGY ELLIPTA 100-62.5-25 MCG/INH IN AEPB: 1.0000 | INHALATION_SPRAY | Freq: Every day | RESPIRATORY_TRACT | 3 refills | Status: DC

## 2020-03-09 ENCOUNTER — Telehealth: Payer: Medicare Other | Admitting: Adult Health

## 2020-03-09 ENCOUNTER — Encounter: Payer: Self-pay | Admitting: Adult Health

## 2020-03-09 ENCOUNTER — Telehealth (INDEPENDENT_AMBULATORY_CARE_PROVIDER_SITE_OTHER): Payer: 59 | Admitting: Adult Health

## 2020-03-09 ENCOUNTER — Ambulatory Visit: Payer: Medicaid Other | Admitting: Specialist

## 2020-03-09 ENCOUNTER — Ambulatory Visit: Payer: Medicaid Other | Admitting: Pulmonary Disease

## 2020-03-09 DIAGNOSIS — G4733 Obstructive sleep apnea (adult) (pediatric): Secondary | ICD-10-CM

## 2020-03-09 NOTE — Progress Notes (Signed)
PATIENT: [name] DOB: [birth year]  REASON FOR VISIT: follow up HISTORY FROM: patient  Virtual Visit via Video Note  I connected with [name] on 94/17/40 at 10:30 AM EST by a video enabled telemedicine application located remotely at [facility] and verified that I am speaking with the correct person using two identifiers who was located at their own home.   I discussed the limitations of evaluation and management by telemedicine and the availability of in person appointments. The patient expressed understanding and agreed to proceed.   PATIENT: [name] DOB: [birth year]  REASON FOR VISIT: follow up HISTORY FROM: patient  HISTORY OF PRESENT ILLNESS: Today 03/09/20:  [name] is a 55 year old female with history of obstructive sleep apnea on BiPAP.  She joins me today for a virtual visit.  She reports that the CPAP is working well for her.  She states that she is not a significant amount of back pain.  Has an appointment Wednesday for injections.  Her download is below  Compliance Report Usage 02/08/2020 - 03/08/2020 Usage days 30/30 days (100%) >= 4 hours 29 days (97%) Average usage (days used) 6 hours 45 minutes   AirCurve 10 VAuto Serial number [id number] Mode Spont IPAP 17 cmH2O EPAP 13 cmH2O Easy-Breathe On  Therapy Leaks - L/min Median: 3.5 95th percentile: 25.9 Maximum: 110.0 Events per hour AI: 1.7 HI: 0.5 AHI: 2.2 Apnea Index Central: 0.1 Obstructive: 1.1 Unknown: 0.4  HISTORY 05/05/19:  [name] is a 55 year old female with a history of obstructive sleep apnea on BiPAP.  Her download indicates that she use her machine 28 out of 30 days for compliance of 93%.  She used her machine greater than 4 hours 26 days for compliance of 87%.  On average she uses her machine 6 hours and 23 minutes.  Her residual AHI is 1 on 20/16 centimeters of water.  She does not have a significant leak.  Reports that she does feel that  the pressure is too strong for her.  She would like it reduced. she returns today for an evaluation.  REVIEW OF SYSTEMS: Out of a complete 14 system review of symptoms, the patient complains only of the following symptoms, and all other reviewed systems are negative.  ALLERGIES: Allergies  Allergen Reactions  . Aspirin Other (See Comments)    Kidney disease  . 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 Corticosteroids Other (See Comments)    Hyperglycemia   . Levofloxacin Itching    HOME MEDICATIONS: Outpatient Medications Prior to Visit  Medication Sig Dispense Refill  . Accu-Chek Softclix Lancets lancets TEST 4 TIMES DAILY 200 each 6  . acetaminophen (TYLENOL) 500 MG tablet Take 1,000 mg by mouth 2 (two) times daily.    [name] Kitchen albuterol (VENTOLIN HFA) 108 (90 Base) MCG/ACT inhaler Inhale 2 puffs into the lungs every 6 (six) hours as needed for wheezing or shortness of breath. 18 g 3  . allopurinol (ZYLOPRIM) 300 MG tablet Take 300 mg by mouth daily.    [name] Kitchen ammonium lactate (LAC-HYDRIN) 12 % lotion APPLY TWICE A DAY AS NEEDED FOR DRY SKIN 400 mL 3  . atorvastatin (LIPITOR) 40 MG tablet TAKE 1 TABLET BY MOUTH EVERY DAY 90 tablet 3  . b complex vitamins capsule Take 1 capsule by mouth daily.    . B-D UF III MINI PEN NEEDLES 31G X 5 MM MISC CHECK SUGARS 4 TIMES  A DAY BEFORE MEALS AND AT BEDTIME. 100 each 12  . baclofen (LIORESAL) 10 MG tablet Take 1 tablet (10 mg total) by mouth 3 (three) times daily as needed for muscle spasms. 90 each 0  . blood glucose meter kit and supplies KIT Dispense based on patient and insurance preference. Use up to four times daily as directed. (FOR ICD-9 250.00, 250.01). 1 each 0  . busPIRone (BUSPAR) 10 MG tablet Take 1 tablet (10 mg total) by mouth 3 (three) times daily. 270 tablet 4  . diclofenac Sodium (VOLTAREN) 1 % GEL APPLY 2 GRAMS TO AFFECTED AREA 4 TIMES A DAY 500 g 1  . diltiazem (CARDIZEM CD) 120 MG 24 hr capsule TAKE 1 CAPSULE  (120 MG TOTAL) BY MOUTH 2 (TWO) TIMES DAILY. 180 capsule 2  . docusate sodium (COLACE) 100 MG capsule Take 200 mg by mouth 2 (two) times daily.    . empagliflozin (JARDIANCE) 25 MG TABS tablet Take 1 tablet (25 mg total) by mouth daily. 90 tablet 3  . fluticasone (FLONASE) 50 MCG/ACT nasal spray Place 2 sprays into both nostrils 2 (two) times daily. 16 g 6  . Fluticasone-Umeclidin-Vilant (TRELEGY ELLIPTA) 100-62.5-25 MCG/INH AEPB Inhale 1 puff into the lungs daily. 180 each 3  . furosemide (LASIX) 40 MG tablet TAKE 2 TABLETS (80 MG TOTAL) BY MOUTH 2 (TWO) TIMES DAILY. 360 tablet 1  . gabapentin (NEURONTIN) 300 MG capsule Take 2 capsules (600 mg total) by mouth 3 (three) times daily. TAKE 2 CAPSULES (600 MG TOTAL) BY MOUTH in the morning and at lunch and take 3 capsules at night 540 capsule 2  . glucose blood (ACCU-CHEK AVIVA PLUS) test strip 1 each by Other route 3 (three) times daily. TEST 3 TIMES DAILY 300 each 3  . HYDROcodone-acetaminophen (NORCO) 7.5-325 MG tablet Take 1 tablet by mouth every 6 (six) hours as needed for moderate pain. 20 tablet 0  . hydrOXYzine (ATARAX/VISTARIL) 10 MG tablet Take 1 tablet (10 mg total) by mouth daily. 90 tablet 3  . insulin glargine (LANTUS SOLOSTAR) 100 UNIT/ML Solostar Pen Inject 50 Units into the skin daily. 15 mL 3  . insulin lispro (HUMALOG) 100 UNIT/ML KwikPen Inject 12 Units into the skin 3 (three) times daily before meals. 15 mL 3  . ketoconazole (NIZORAL) 2 % cream APPLY 1 FINGERTIP AMOUNT TO EACH FOOT DAILY. 30 g 0  . KLOR-CON M20 20 MEQ tablet TAKE 1 TABLET TWICE WEEKLY ON MONDAY AND THURSDAY 45 tablet 2  . loratadine (CLARITIN) 10 MG tablet TAKE 1 TABLET BY MOUTH EVERY DAY 90 tablet 0  . losartan (COZAAR) 100 MG tablet Take 1 tablet (100 mg total) by mouth daily. 90 tablet 3  . metolazone (ZAROXOLYN) 2.5 MG tablet Take 1 tablet (2.5 mg total) by mouth 2 (two) times a week. TAKE 1 TABLET TWICE WEEKLY ON MONDAYS AND THURSDAYS ONLY 15 tablet 6  .  montelukast (SINGULAIR) 10 MG tablet TAKE 1 TABLET BY MOUTH EVERYDAY AT BEDTIME 90 tablet 3  . Multiple Vitamins-Minerals (MULTIVITAMIN WITH MINERALS) tablet Take 1 tablet by mouth daily.    [name] Kitchen NARCAN 4 MG/0.1ML LIQD nasal spray kit Place 1 spray into the nose as needed (accidental overdose).    . nicotine polacrilex (NICORETTE) 4 MG gum Take 1 each (4 mg total) by mouth as needed for smoking cessation. 100 each 2  . NON FORMULARY Uses a C-PAP at bedtime    . nystatin (MYCOSTATIN) 100000 UNIT/ML suspension TAKE 5 MLS (500,000 UNITS TOTAL)  BY MOUTH 4 (FOUR) TIMES DAILY. SWISH AND SWALLOW. 60 mL 3  . Omega-3 Fatty Acids (FISH OIL) 1000 MG CAPS Take 1,000 mg by mouth daily.     [name] Kitchen omeprazole (PRILOSEC) 20 MG capsule TAKE 1 CAPSULE BY MOUTH EVERY DAY 90 capsule 3  . Semaglutide,0.25 or 0.5MG/DOS, (OZEMPIC, 0.25 OR 0.5 MG/DOSE,) 2 MG/1.5ML SOPN Inject 0.25 mg into the skin once a week. Minus 2 clicks 4.5 mL 0  . varenicline (CHANTIX CONTINUING MONTH PAK) 1 MG tablet Take 1 tablet (1 mg total) by mouth 2 (two) times daily. 60 tablet 3  . venlafaxine XR (EFFEXOR-XR) 75 MG 24 hr capsule TAKE 1 CAPSULE BY MOUTH DAILY WITH BREAKFAST. 90 capsule 2   Facility-Administered Medications Prior to Visit  Medication Dose Route Frequency Provider Last Rate Last Admin  . technetium tetrofosmin (TC-MYOVIEW) injection 87.6 millicurie  81.1 millicurie Intravenous Once PRN Sueanne Margarita, MD        PAST MEDICAL HISTORY: Past Medical History:  Diagnosis Date  . Alcoholism ([place])   . Anxiety   . Arthritis 04-10-11   hips, shoulders, back  . Asthma   . Bipolar disorder ([place])   . Cardiomegaly   . Carpal tunnel syndrome, bilateral   . Cervical cancer ([place]) 1993   cervical, no treatment done, went away per pt  . Cervical dysplasia or atypia 04-10-11   '93- once dx.-got pregnant-no intervention, then postpartum, no dysplasia found  . CHF (congestive heart failure) ([place])    no cardiologist 2014 dx, none now  . Chronic  hypoxemic respiratory failure ([place])   . Chronic kidney disease    stage 3 kidney disease  . 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 Pathology showed Condyloma, gluteal CONDYLOMA ACUMINATUM.   [name] Kitchen COPD (chronic obstructive pulmonary disease) ([place])   . Depression   . Diabetes mellitus without complication ([place])   . Dyspnea    with actity, sitting  . Fall 12/16/2016  . Fibrocystic breast disease   . GERD (gastroesophageal reflux disease)   . Grade I diastolic dysfunction 57/26/2035   Noted on ECHO  . Hepatitis    hep B-count is low at present,doesn't register  . History of PSVT (paroxysmal supraventricular tachycardia)   . Hypercalcemia   . Hyperlipidemia   . Hypertension   . Incomplete left bundle branch block (LBBB) 09/24/2018   Noted on EKG  . Lung nodule 11/2017   right and left lung  . Migraine headache    none recent  . Morbid obesity ([place]) 03/27/2006  . Neuropathy   . Pneumonia    walking pneu 15 yrs. ago  . Skin lesion 03/15/2011   In gluteal crease now s/p removal by Dr. [name] of General Surgery on 3/12. Path shows condyloma.     . Sleep apnea 04-10-11   uses cpap, pt does not know settings  . Trigger finger    left third  . Urge incontinence of urine     PAST SURGICAL HISTORY: Past Surgical History:  Procedure Laterality Date  . ANAL FISTULECTOMY  04/17/2011   Procedure: FISTULECTOMY ANAL;  Surgeon: Imogene Burn. [name], MD;  Location: [facility];  Service: General;  Laterality: N/A;  Excision of Condyloma Gluteal Cleft   . ANAL RECTAL MANOMETRY N/A 03/26/2019   Procedure: ANO RECTAL MANOMETRY;  Surgeon: Thornton Park, MD;  Location: [facility];  Service: Gastroenterology;  Laterality: N/A;  . BIOPSY  03/09/2019   Procedure: BIOPSY;  Surgeon: Thornton Park, MD;  Location:  [facility];  Service: Gastroenterology;;  . BOTOX INJECTION N/A 12/29/2018   Procedure: BOTOX INJECTION(100 UNITS)  WITH CYSTOSCOPY;  Surgeon: Ceasar Mons,  MD;  Location: [facility];  Service: Urology;  Laterality: N/A;  . BOTOX INJECTION N/A 07/27/2019   Procedure: BOTOX INJECTION WITH CYSTOSCOPY;  Surgeon: Ceasar Mons, MD;  Location: [facility];  Service: Urology;  Laterality: N/A;  ONLY NEEDS 30 MIN  . BREAST CYST EXCISION     bilateral breast, 3 cysts removed from each breast  . BREAST EXCISIONAL BIOPSY Right    x 3  . BREAST EXCISIONAL BIOPSY Left    x 3  . CARPAL TUNNEL RELEASE Bilateral   . Cervical biospy    . CERVICAL FUSION  04-10-11   x3- cervical fusion with plating and screws-Dr. [name]  . COLONOSCOPY WITH PROPOFOL N/A 07/06/2015   Procedure: COLONOSCOPY WITH PROPOFOL;  Surgeon: [name], MD;  [facility];  Service: Endoscopy;  Laterality: N/A;  . COLONOSCOPY WITH PROPOFOL N/A 03/09/2019   Procedure: COLONOSCOPY WITH PROPOFOL;  Surgeon: Thornton Park, MD;  [facility]: [facility];  Service: Gastroenterology;  Laterality: N/A;  . DOPPLER ECHOCARDIOGRAPHY  04/06/2012   AT [place] 55-60%  . FOOT SURGERY Left 2018  . FRACTURE SURGERY     little toe left foot  . I & D EXTREMITY Left 12/18/2016   Procedure: IRRIGATION AND DEBRIDEMENT LEFT FOOT, CLOSURE;  Surgeon: [name], MD;  Location: [place];  Service: Orthopedics;  Laterality: Left;  . [place] SURGERY  04-10-11   x5-Lumbar fusion-retained hardware.(Dr. Louanne Skye)  . MULTIPLE TOOTH EXTRACTIONS    . POLYPECTOMY  03/09/2019   Procedure: POLYPECTOMY;  Surgeon: Thornton Park, MD;  [facility];  Service: Gastroenterology;;  . SHOULDER ARTHROSCOPY WITH ROTATOR CUFF REPAIR AND SUBACROMIAL DECOMPRESSION Left 05/07/2019   Procedure: LEFT SHOULDER ARTHROSCOPY WITH DEBRIDEMENT, DISTAL CLAVICLE EXCISION,  SUBACROMIAL DECOMPRESSION AND POSSIBLE ROTATOR CUFF REPAIR;  Surgeon: [name], MD;  Location: [place];  Service: Orthopedics;  Laterality: Left;  . TEE WITHOUT CARDIOVERSION N/A 04/06/2012   Procedure: TRANSESOPHAGEAL ECHOCARDIOGRAM (TEE);   Surgeon: [name], MD;  Location: [facility];  Service: Cardiovascular;  Laterality: N/A;  . TRIGGER FINGER RELEASE Right    middle finger  . TRIGGER FINGER RELEASE Left 06/28/2016   Procedure: RELEASE TRIGGER FINGER LEFT 3RD FINGER;  Surgeon: [name], MD;  Location: [place];  Service: Orthopedics;  Laterality: Left;  . TRIGGER FINGER RELEASE Right 06/12/2016   Procedure: RIGHT INDEX FINGER TRIGGER RELEASE;  Surgeon: [name], MD;  Location: [place];  Service: Orthopedics;  Laterality: Right;  . TRIGGER FINGER RELEASE Right 01/19/2018   Procedure: RIGHT RING FINGER TRIGGER FINGER RELEASE;  Surgeon: [name], MD;  Location: [place];  Service: Orthopedics;  Laterality: Right;  . TRIGGER FINGER RELEASE Left 05/07/2019   Procedure: RELEASE TRIGGER FINGER LEFT INDEX FINGER;  Surgeon: [name], MD;  Location: [place];  Service: Orthopedics;  Laterality: Left;    FAMILY HISTORY: Family History  Problem Relation Age of Onset  . Pulmonary embolism Mother   . Stroke Father   . Alcohol abuse Father   . Pneumonia Father   . Hypertension Father   . Breast cancer Maternal Aunt   . Hypertension Other   . Diabetes Other        Aunts and cousins  . Asthma Sister   . Depression Sister   . Arthritis Sister   . Sickle  cell anemia Son     SOCIAL HISTORY: Social History   Socioeconomic History  . Marital status: Single    Spouse name: Not on file  . Number of children: 1  . Years of education: 34  . Highest education level: 12th grade  Occupational History  . Occupation: disabled    Comment: Disabled  Tobacco Use  . Smoking status: Former Smoker    Packs/day: 0.20    Years: 40.00    Pack years: 8.00    Types: Cigarettes    Start date: 01/28/1977    Quit date: 07/25/2019    Years since quitting: 0.6  . Smokeless tobacco: Never Used  . Tobacco comment: 4-5 cigarettes per day  Vaping Use  . Vaping Use: Never used  Substance and Sexual Activity  . Alcohol use: No     Alcohol/week: 0.0 standard drinks  . Drug use: Not Currently    Types: Marijuana, "Crack" cocaine    Comment: hx marijuana usemany years ago, Crack -none since 11/2015  . Sexual activity: Not Currently    Partners: Male    Birth control/protection: Post-menopausal  Other Topics Concern  . Not on file  Social History Narrative   Current Social History       Who lives at home: Patient lives alone in one level home; has 4 steps onto porch with a handrail. Has smoke alarms, no throw rugs. Has elevated toilet. Has shower bench in tub with grab rails.    Transportation: Patient has own vehicle and drives herself    Important Relationships "Family and friends"    Pets: None    Likes to eat varied diet. Seafood, fish, roasted [place] breast, vegetables and salads and fruits.   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 I'm a lovable person."                                                                                                   Social Determinants of Radio broadcast assistant Strain: Not on file  Food Insecurity: Not on file  Transportation Needs: Not on file  Physical Activity: Not on file  Stress: Not on file  Social Connections: Not on file  Intimate Partner Violence: Not on file      PHYSICAL EXAM Generalized: Well developed, in no acute distress   Neurological examination  Mentation: Alert oriented to time, place, history taking. Follows all commands speech and language fluent Cranial nerve II-XII:Extraocular movements were full. Facial symmetry noted. uvula tongue midline. Head turning and shoulder shrug  were normal and symmetric. Motor: Good strength throughout subjectively per patient Sensory: Sensory testing is  intact to soft touch on all 4 extremities subjectively per patient Coordination: Cerebellar testing reveals good finger-nose-finger  Reflexes: UTA  DIAGNOSTIC DATA (LABS, IMAGING, TESTING) - I reviewed patient records, labs, notes, testing and imaging myself where available.  Lab Results  Component Value Date   WBC 13.0 (H) 01/11/2020   HGB 18.7 (H) 01/11/2020   HCT 54.7 (H) 01/11/2020   MCV 83 01/11/2020   PLT 311 01/11/2020      Component Value Date/Time   NA 139 01/20/2020 1221   K 3.9 01/20/2020 1221   CL 94 (L) 01/20/2020 1221   CO2 24 01/20/2020 1221   GLUCOSE 264 (H) 01/20/2020 1221   GLUCOSE 151 (H) 07/21/2019 1109   BUN 34 (H) 01/20/2020 1221   CREATININE 1.47 (H) 01/20/2020 1221   CREATININE 1.54 (H) 11/30/2015 1112   CALCIUM 9.8 01/20/2020 1221   PROT 7.4 07/21/2019 1109   PROT 7.1 02/23/2018 1446   ALBUMIN 4.0 07/21/2019 1109   ALBUMIN 4.3 02/23/2018 1446   AST 28 07/21/2019 1109   ALT 34 07/21/2019 1109   ALKPHOS 89 07/21/2019 1109   BILITOT 0.6 07/21/2019 1109   BILITOT 0.2 02/23/2018 1446   GFRNONAA 40 (L) 01/20/2020 1221   GFRNONAA 39 (L) 11/30/2015 1112   GFRAA 46 (L) 01/20/2020 1221   GFRAA 44 (L) 11/30/2015 1112   Lab Results  Component Value Date   CHOL 146 09/24/2018   HDL 37 (L) 09/24/2018   LDLCALC 76 09/24/2018   LDLDIRECT 114 (H) 09/15/2012   TRIG 165 (H) 09/24/2018   CHOLHDL 3.9 09/24/2018   Lab Results  Component Value Date   HGBA1C 9.3 (A) 11/08/2019   No results found for: [id number] Lab Results  Component Value Date   TSH 0.469 03/06/2018      ASSESSMENT AND PLAN 55 y.o. year old female  has a past medical history of Alcoholism ([place]), Anxiety, Arthritis (04-10-11), Asthma, Bipolar disorder ([place]), Cardiomegaly, Carpal tunnel syndrome, bilateral, Cervical cancer ([place]) (1993), Cervical dysplasia or atypia (04-10-11), CHF (congestive heart failure) ([place]), Chronic hypoxemic respiratory failure ([place]), Chronic kidney disease,  Condyloma - gluteal cleft (04/09/2011), COPD (chronic obstructive pulmonary disease) ([place]), Depression, Diabetes mellitus without complication ([place]), Dyspnea, Fall (12/16/2016), Fibrocystic breast disease, GERD (gastroesophageal reflux disease), Grade I diastolic dysfunction (94/32/0037), Hepatitis, History of PSVT (paroxysmal supraventricular tachycardia), Hypercalcemia, Hyperlipidemia, Hypertension, Incomplete left bundle branch block (LBBB) (09/24/2018), Lung nodule (11/2017), Migraine headache, Morbid obesity ([place]) (03/27/2006), Neuropathy, Pneumonia, Skin lesion (03/15/2011), Sleep apnea (04-10-11), Trigger finger, and Urge incontinence of urine. here with:  OSA on CPAP  . CPAP compliance excellent . Residual AHI is good . Encouraged patient to continue using CPAP nightly and > 4 hours each night . F/U in 1 year or sooner if needed  I spent 20 minutes of face-to-face and non-face-to-face time with patient.  This included previsit chart review, lab review, study review, order entry, electronic health record documentation, patient education.  Ward Givens, MSN, NP-C 03/09/2020, 10:29 AM [facility] [address], [place], [place] [address] [phone]

## 2020-03-10 ENCOUNTER — Encounter: Payer: Self-pay | Admitting: Family Medicine

## 2020-03-10 ENCOUNTER — Other Ambulatory Visit: Payer: Self-pay | Admitting: Podiatry

## 2020-03-10 ENCOUNTER — Other Ambulatory Visit: Payer: Self-pay | Admitting: Family Medicine

## 2020-03-10 DIAGNOSIS — B353 Tinea pedis: Secondary | ICD-10-CM

## 2020-03-10 DIAGNOSIS — E1151 Type 2 diabetes mellitus with diabetic peripheral angiopathy without gangrene: Secondary | ICD-10-CM

## 2020-03-13 ENCOUNTER — Encounter: Payer: 59 | Attending: General Surgery | Admitting: Skilled Nursing Facility1

## 2020-03-13 ENCOUNTER — Ambulatory Visit: Payer: Medicaid Other | Admitting: Skilled Nursing Facility1

## 2020-03-13 DIAGNOSIS — N184 Chronic kidney disease, stage 4 (severe): Secondary | ICD-10-CM | POA: Diagnosis not present

## 2020-03-13 NOTE — Progress Notes (Signed)
Medical Nutrition Therapy  Referral stated Supervised Weight Loss (SWL) visits needed: pt states [facility] advised her she needs 3 more SWL. 3 out of 3 added another appointment so today is her last appt  Planned surgery: Sleeve Gastrectomy  Pt expectation of surgery: to lose weight Pt expectation of dietitian: to guide  Nutrition Clearance: Pt seems prepared for surgery and understands the changes neccessary to be successful as represented by the changes she has already accomplished  appt conducted via MyChart pt verbalized understanding of appt type ,imitiations pt identified via DOB and name  NUTRITION ASSESSMENT   Anthropometrics  Start weight at [facility]: 405.6 lbs (date: 01/15/2018)  Wt today: 350.6 pounds  Height: 68.5 in BMI: 52.53 kg/m2     Clinical  Medical hx: CHF, OSA, COPD, GERD, Diabetes, CKD, hyperlipidemia, GOUT Medications: fast and long acting insulin  Labs: WBC 13, RBC 6.59, Hemoglobin 18.7, HCT 54.7,  K 3.2, BUN 32, Creatinine 1.48, GFR 46, A1C 9.3 Notable signs/symptoms:  Any previous deficiencies? No  Micronutrient Nutrition Focused Physical Exam: Hair: No issues observed Eyes: No issues observed Mouth: No issues observed Neck: No issues observed Nails: No issues observed Skin: No issues observed  Body Composition Scale 10/21/2019  Current Body Weight 351.7  Total Body Fat % 50.4  Visceral Fat 23  Fat-Free Mass % 49.5   Total Body Water % 39.2  Muscle-Mass lbs 35.8  BMI 50.6  Body Fat Displacement          Torso  lbs 110.3         Left Leg  lbs 22         Right Leg  lbs 22         Left Arm  lbs 11         Right Arm   lbs 11    Lifestyle & Dietary Hx  Pt states since her last appt her back has hurt so bad she has not been able to leave her bed. Pt states her blood sugars have been excellent stating they have been around 91.    24-Hr Dietary Recall: continued First Meal: fruit + protein shake or cereal Snack: fruit Second Meal: fruit + lettuce  salad + smoked [place] Snack: [place] sausage stick or peanut butter crackers Third Meal: fruit + lettuce salad + smoked [place] or [place] sandwich Snack: fruit or honey graham crackers + peanut butter Beverages: slim fast, powerade zero, diet gingerale, water + flavoring, sugar free natures twist   Estimated Energy Needs Calories: 1600  NUTRITION DIAGNOSIS  Overweight/obesity ([place]-3.3) related to past poor dietary habits and physical inactivity as evidenced by patient w/ planned Sleeve Gastrectomy surgery following dietary guidelines for continued weight loss.    NUTRITION INTERVENTION  Nutrition counseling (C-1) and education (E-2) to facilitate bariatric surgery goals.   Pre-Op Goals Reviewed with the Patient -Keep up the great work! -Avoid sugar free cookies they still have carbohydrates  -limit your portion sizes including items that are healthy for you   Handouts Provided Include in the past . Bariatric Surgery handouts (Nutrition Visits, Pre-Op Goals, Protein Shakes, Vitamins & Minerals)  Learning Style & Readiness for Change Teaching method utilized: Visual & Auditory  Demonstrated degree of understanding via: Teach Back  Barriers to learning/adherence to lifestyle change: none identified      MONITORING & EVALUATION Dietary intake, weekly physical activity, body weight, and pre-op goals reached at next nutrition visit.    Next Steps  Patient is to follow up at  [facility] for Pre-Op Class >2 weeks before surgery for further nutrition education.

## 2020-03-15 ENCOUNTER — Encounter: Payer: Self-pay | Admitting: Physical Medicine and Rehabilitation

## 2020-03-15 ENCOUNTER — Ambulatory Visit: Payer: Self-pay

## 2020-03-15 ENCOUNTER — Other Ambulatory Visit: Payer: Self-pay

## 2020-03-15 ENCOUNTER — Ambulatory Visit (INDEPENDENT_AMBULATORY_CARE_PROVIDER_SITE_OTHER): Payer: 59 | Admitting: Physical Medicine and Rehabilitation

## 2020-03-15 VITALS — BP 148/95 | HR 61

## 2020-03-15 DIAGNOSIS — M961 Postlaminectomy syndrome, not elsewhere classified: Secondary | ICD-10-CM | POA: Diagnosis not present

## 2020-03-15 DIAGNOSIS — M5416 Radiculopathy, lumbar region: Secondary | ICD-10-CM | POA: Diagnosis not present

## 2020-03-15 MED ORDER — BETAMETHASONE SOD PHOS & ACET 6 (3-3) MG/ML IJ SUSP
12.0000 mg | Freq: Once | INTRAMUSCULAR | Status: AC
  Administered 2020-03-15: 12 mg

## 2020-03-15 NOTE — Progress Notes (Signed)
Numeric Pain Rating Scale and Functional Assessment Average Pain 9   In the last MONTH (on 0-10 scale) has pain interfered with the following?  1. General activity like being  able to carry out your everyday physical activities such as walking, climbing stairs, carrying groceries, or moving a chair?  Rating(10) Pain is a 9 for the neck. 10 plus for back.  +Driver, -BT, -Dye Allergies.

## 2020-03-17 ENCOUNTER — Other Ambulatory Visit: Payer: Self-pay

## 2020-03-17 ENCOUNTER — Ambulatory Visit (INDEPENDENT_AMBULATORY_CARE_PROVIDER_SITE_OTHER): Payer: 59 | Admitting: Family Medicine

## 2020-03-17 ENCOUNTER — Encounter: Payer: Self-pay | Admitting: Family Medicine

## 2020-03-17 VITALS — BP 136/74 | HR 100 | Ht 69.0 in | Wt 345.8 lb

## 2020-03-17 DIAGNOSIS — Z741 Need for assistance with personal care: Secondary | ICD-10-CM | POA: Diagnosis not present

## 2020-03-17 DIAGNOSIS — Z794 Long term (current) use of insulin: Secondary | ICD-10-CM | POA: Diagnosis not present

## 2020-03-17 DIAGNOSIS — N183 Chronic kidney disease, stage 3 unspecified: Secondary | ICD-10-CM | POA: Diagnosis not present

## 2020-03-17 DIAGNOSIS — E1122 Type 2 diabetes mellitus with diabetic chronic kidney disease: Secondary | ICD-10-CM

## 2020-03-17 LAB — POCT GLYCOSYLATED HEMOGLOBIN (HGB A1C): Hemoglobin A1C: 8.8 % — AB (ref 4.0–5.6)

## 2020-03-17 LAB — HM DIABETES EYE EXAM

## 2020-03-17 MED ORDER — INSULIN LISPRO (1 UNIT DIAL) 100 UNIT/ML (KWIKPEN)
18.0000 [IU] | PEN_INJECTOR | Freq: Three times a day (TID) | SUBCUTANEOUS | 3 refills | Status: DC
Start: 2020-03-17 — End: 2020-05-04

## 2020-03-17 MED ORDER — INSULIN LISPRO (1 UNIT DIAL) 100 UNIT/ML (KWIKPEN): 16.0000 [IU] | PEN_INJECTOR | Freq: Three times a day (TID) | SUBCUTANEOUS | 3 refills | Status: DC

## 2020-03-17 NOTE — Patient Instructions (Signed)
It was great seeing you today.  I am glad you are doing so well.  Your hemoglobin A1c was 8.8 today from 9.3 at the previous check.  I want to increase your mealtime insulin to 18 units with meals rather than 16.  Want to keep the rest of your medications the same for now.  Please keep track of your blood sugars and we will follow-up in 3 months with a repeat hemoglobin A1c.  If you need anything between now and then please let me know.  I will work on your paperwork and let you know if we have trouble finding it.  It was great seeing you!

## 2020-03-17 NOTE — Progress Notes (Signed)
    SUBJECTIVE:   CHIEF COMPLAINT / HPI:   Diabetes checkup  Previous hemoglobin A1c was 9.3. Patient reports that her blood sugars have been doing better over the last month. She reports her morning glucoses are typically above 150 but she has had occasional morning fasting glucoses in the 90s. She reports compliance with her medications including Jardiance, Ozempic, Lantus, Humalog. Denies any hypoglycemic episodes. Reports that she has been receiving regular steroid injections into her joints which is what she attributes to her increased blood sugars. She is going to start swimming as well as work on her diet.   OBJECTIVE:   BP 136/74   Pulse 100   Ht 5\' 9"  (1.753 m)   Wt (!) 345 lb 12.8 oz (156.9 kg)   LMP 04/10/2006   SpO2 97%   BMI 51.07 kg/m   General: Well-appearing, obese 55 year old female in no acute distress Cardiac: Regular rate and rhythm, no murmurs appreciated Respiratory: Normal work of breathing, lungs clear to auscultation bilaterally Abdomen: Soft, nontender, positive bowel sounds MSK: Ambulates minor difficulty, not walking with a cane today.  ASSESSMENT/PLAN:   Type 2 diabetes mellitus with stage 3 chronic kidney disease, with long-term current use of insulin ([place]) Patient reports that her blood sugars are getting better controlled. She denies any hypoglycemic episodes. Reports fasting morning glucoses typically around 150 but occasional in the 90s. -Continue Lantus 50 units daily -Increase Humalog to 18 units with meals -Follow-up in 3 months for hemoglobin A1c -Patient's kidney function is decreased which is why she has not been started on Metformin. Consider repeat BMP and starting Metformin if able at next visit. -Patient denies any side effects from the Ozempic, consider increasing Ozempic dose as well.   Patient needs a annual wellness visit scheduled with the nurse clinic when able.  [name], MD [place]

## 2020-03-17 NOTE — Assessment & Plan Note (Signed)
Patient reports that her blood sugars are getting better controlled. She denies any hypoglycemic episodes. Reports fasting morning glucoses typically around 150 but occasional in the 90s. -Continue Lantus 50 units daily -Increase Humalog to 18 units with meals -Follow-up in 3 months for hemoglobin A1c -Patient's kidney function is decreased which is why she has not been started on Metformin. Consider repeat BMP and starting Metformin if able at next visit. -Patient denies any side effects from the Ozempic, consider increasing Ozempic dose as well.

## 2020-03-20 ENCOUNTER — Inpatient Hospital Stay: Admission: RE | Admit: 2020-03-20 | Payer: Medicaid Other | Source: Ambulatory Visit

## 2020-03-21 ENCOUNTER — Other Ambulatory Visit: Payer: Medicaid Other

## 2020-03-21 ENCOUNTER — Ambulatory Visit: Payer: Self-pay | Admitting: Licensed Clinical Social Worker

## 2020-03-21 NOTE — Chronic Care Management (AMB) (Signed)
  Care Management  Consultation Note  06/14/9840 Name: ESMERELDA FINNIGAN MRN: [id number] DOB: [birth year]  [name] is a 55 y.o. year old female who is a primary care patient of [name], MD. The CCM team was consulted for Consultation reference care coordination needs.   Assessment: Patient needs assistance with establishing [name]. Intervention: Patient was not interviewed or contacted during this encounter.  CCM LCSW collaborated with PCP .  Conducted brief assessment, recommendations and relevant information discussed via in-basket message.  Follow up Plan: PCP will place a formal CCM referral.   Collaboration with [name], MD regarding development and update of comprehensive plan of care as evidenced by provider attestation and co-signature Review of patient past medical history, allergies, medications, and health status, including review of pertinent consultant reports was performed as part of comprehensive evaluation and provision of care management/care coordination services.    [name], LCSW

## 2020-03-21 NOTE — Addendum Note (Signed)
Addended by: Concepcion Living on: 03/21/2020 08:59 AM   Modules accepted: Orders

## 2020-03-22 ENCOUNTER — Ambulatory Visit: Payer: 59 | Admitting: Licensed Clinical Social Worker

## 2020-03-22 ENCOUNTER — Telehealth: Payer: Self-pay | Admitting: *Deleted

## 2020-03-22 ENCOUNTER — Encounter: Payer: Self-pay | Admitting: Specialist

## 2020-03-22 ENCOUNTER — Ambulatory Visit: Payer: Medicaid Other | Admitting: Pulmonary Disease

## 2020-03-22 ENCOUNTER — Encounter: Payer: Self-pay | Admitting: Family Medicine

## 2020-03-22 DIAGNOSIS — Z741 Need for assistance with personal care: Secondary | ICD-10-CM

## 2020-03-22 NOTE — Chronic Care Management (AMB) (Signed)
Care Management Clinical Social Work Note  7/00/1749 Name: LAVONNE KINDERMAN MRN: [id number] DOB: [birth year]  [name] is a 55 y.o. year old female who is a primary care patient of [name], MD.  The Care Management team was consulted for assistance with chronic disease management and coordination needs.  Engaged with patient by telephone for initial visit in response to provider referral for social work chronic care management and care coordination services  Consent to Services:  Ms. Gores was given information about Care Management services today including:  1. Care Management services includes personalized support from designated clinical staff supervised by her physician, including individualized plan of care and coordination with other care providers 2. 24/7 contact phone numbers for assistance for urgent and routine care needs. 3. The patient may stop case management services at any time by phone call to the office staff. Patient agreed to services and consent obtained.   Assessment: Patient is currently experiencing difficulty with meeting activities of daily living. Had difficulty with [name] and discontinued services in Oct. 2021.  She has not been able to meet her need and realizes she needs the help. Patient reports doing well with managing symptoms of depression and reports no other needs or concerns.  See Care Plan below for interventions and patient self-care actives.  Follow up Plan: Patient would like continued follow-up.  CCM LCSW will f/u with patient in 2 weeks to provide an update on progress of [name]. Patient will call office if needed prior to next encounter  Review of patient past medical history, allergies, medications, and health status, including review of relevant consultants reports was performed today as part of a comprehensive evaluation and provision of chronic care management and care coordination services.  SDOH (Social Determinants of Health) assessments  and interventions performed:  No needs identified  Advanced Directives Status: See [employer]yn[employer] application for related entries.  Care Plan  Allergies  Allergen Reactions  . Aspirin Other (See Comments)    Kidney disease  . 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 Corticosteroids Other (See Comments)    Hyperglycemia   . Levofloxacin Itching    Outpatient Encounter Medications as of 03/22/2020  Medication Sig Note  . Accu-Chek Softclix Lancets lancets TEST 4 TIMES DAILY   . acetaminophen (TYLENOL) 500 MG tablet Take 1,000 mg by mouth 2 (two) times daily.   [name] Kitchen albuterol (VENTOLIN HFA) 108 (90 Base) MCG/ACT inhaler Inhale 2 puffs into the lungs every 6 (six) hours as needed for wheezing or shortness of breath. 11/11/2019: Last dose last week Wednesday 10/6  . allopurinol (ZYLOPRIM) 300 MG tablet Take 300 mg by mouth daily.   [name] Kitchen ammonium lactate (LAC-HYDRIN) 12 % lotion APPLY TWICE A DAY AS NEEDED FOR DRY SKIN   . atorvastatin (LIPITOR) 40 MG tablet TAKE 1 TABLET BY MOUTH EVERY DAY   . b complex vitamins capsule Take 1 capsule by mouth daily.   . B-D UF III MINI PEN NEEDLES 31G X 5 MM MISC CHECK SUGARS 4 TIMES A DAY BEFORE MEALS AND AT BEDTIME.   . baclofen (LIORESAL) 10 MG tablet Take 1 tablet (10 mg total) by mouth 3 (three) times daily as needed for muscle spasms.   . blood glucose meter kit and supplies KIT Dispense based on patient and insurance preference. Use up to four times daily as directed. (FOR ICD-9 250.00, 250.01).   . busPIRone (BUSPAR) 10 MG tablet Take 1 tablet (10 mg  total) by mouth 3 (three) times daily.   . diclofenac Sodium (VOLTAREN) 1 % GEL APPLY 2 GRAMS TO AFFECTED AREA 4 TIMES A DAY   . diltiazem (CARDIZEM CD) 120 MG 24 hr capsule TAKE 1 CAPSULE (120 MG TOTAL) BY MOUTH 2 (TWO) TIMES DAILY.   [name] Kitchen docusate sodium (COLACE) 100 MG capsule Take 200 mg by mouth 2 (two) times daily.   . empagliflozin (JARDIANCE) 25 MG TABS tablet Take 1 tablet (25 mg  total) by mouth daily.   . fluticasone (FLONASE) 50 MCG/ACT nasal spray Place 2 sprays into both nostrils 2 (two) times daily.   . Fluticasone-Umeclidin-Vilant (TRELEGY ELLIPTA) 100-62.5-25 MCG/INH AEPB Inhale 1 puff into the lungs daily.   . furosemide (LASIX) 40 MG tablet TAKE 2 TABLETS (80 MG TOTAL) BY MOUTH 2 (TWO) TIMES DAILY.   [name] Kitchen gabapentin (NEURONTIN) 300 MG capsule Take 2 capsules (600 mg total) by mouth 3 (three) times daily. TAKE 2 CAPSULES (600 MG TOTAL) BY MOUTH in the morning and at lunch and take 3 capsules at night   . glucose blood (ACCU-CHEK AVIVA PLUS) test strip 1 each by Other route 3 (three) times daily. TEST 3 TIMES DAILY   . HYDROcodone-acetaminophen (NORCO) 7.5-325 MG tablet Take 1 tablet by mouth every 6 (six) hours as needed for moderate pain.   . hydrOXYzine (ATARAX/VISTARIL) 10 MG tablet Take 1 tablet (10 mg total) by mouth daily.   . insulin glargine (LANTUS SOLOSTAR) 100 UNIT/ML Solostar Pen Inject 50 Units into the skin daily.   . insulin lispro (HUMALOG) 100 UNIT/ML KwikPen Inject 18 Units into the skin 3 (three) times daily before meals.   [name] Kitchen ketoconazole (NIZORAL) 2 % cream APPLY 1 FINGERTIP AMOUNT TO EACH FOOT DAILY.   [name] Kitchen KLOR-CON M20 20 MEQ tablet TAKE 1 TABLET TWICE WEEKLY ON MONDAY AND THURSDAY   . loratadine (CLARITIN) 10 MG tablet TAKE 1 TABLET BY MOUTH EVERY DAY   . losartan (COZAAR) 100 MG tablet Take 1 tablet (100 mg total) by mouth daily.   . metolazone (ZAROXOLYN) 2.5 MG tablet Take 1 tablet (2.5 mg total) by mouth 2 (two) times a week. TAKE 1 TABLET TWICE WEEKLY ON MONDAYS AND THURSDAYS ONLY   . montelukast (SINGULAIR) 10 MG tablet TAKE 1 TABLET BY MOUTH EVERYDAY AT BEDTIME   . Multiple Vitamins-Minerals (MULTIVITAMIN WITH MINERALS) tablet Take 1 tablet by mouth daily.   [name] Kitchen NARCAN 4 MG/0.1ML LIQD nasal spray kit Place 1 spray into the nose as needed (accidental overdose). 07/27/2019: Never used  . nicotine polacrilex (NICORETTE) 4 MG gum Take 1 each (4 mg  total) by mouth as needed for smoking cessation.   . NON FORMULARY Uses a C-PAP at bedtime   . nystatin (MYCOSTATIN) 100000 UNIT/ML suspension TAKE 5 MLS (500,000 UNITS TOTAL) BY MOUTH 4 (FOUR) TIMES DAILY. SWISH AND SWALLOW.   . Omega-3 Fatty Acids (FISH OIL) 1000 MG CAPS Take 1,000 mg by mouth daily.    [name] Kitchen omeprazole (PRILOSEC) 20 MG capsule TAKE 1 CAPSULE BY MOUTH EVERY DAY   . Semaglutide,0.25 or 0.5MG/DOS, (OZEMPIC, 0.25 OR 0.5 MG/DOSE,) 2 MG/1.5ML SOPN Inject 0.25 mg into the skin once a week. Minus 2 clicks   . varenicline (CHANTIX CONTINUING MONTH PAK) 1 MG tablet Take 1 tablet (1 mg total) by mouth 2 (two) times daily.   [name] Kitchen venlafaxine XR (EFFEXOR-XR) 75 MG 24 hr capsule TAKE 1 CAPSULE BY MOUTH DAILY WITH BREAKFAST.    Facility-Administered Encounter Medications as of 03/22/2020  Medication  .  technetium tetrofosmin (TC-MYOVIEW) injection 88.9 millicurie    Patient Active Problem List   Diagnosis Date Noted  . Vaginal discharge 01/20/2020  . Prolapse of anterior vaginal wall 11/10/2019  . Skin ulcer of left thigh, limited to breakdown of skin ([place]) 09/09/2019  . Ulnar nerve compression 08/06/2019  . Diabetic neuropathy associated with type 2 diabetes mellitus ([place]) 08/06/2019  . Body mass index 50.0-59.9, adult ([place]) 06/12/2019  . Impingement syndrome of left shoulder 05/07/2019  . Polyp of colon   . Polyp of ascending colon   . Rectal discomfort 02/12/2019  . Osteoarthritis of right hip 02/12/2019  . Nontraumatic tear of left supraspinatus tendon 12/30/2018  . Gout 12/17/2018  . Nontraumatic complete tear of right rotator cuff 12/08/2018  . Rotator cuff tear, right 12/01/2018  . Tobacco abuse 08/21/2018  . Right groin pain 07/20/2018  . Depressed mood 07/13/2018  . Paroxysmal SVT (supraventricular tachycardia) ([place]) 03/20/2018  . Pure hypercholesterolemia 03/20/2018  . Muscle cramping 02/25/2018  . Nodule of upper lobe of right lung 02/13/2018  . Falls, subsequent  encounter 01/30/2018  . Pain in left foot 11/04/2017  . Essential hypertension 10/27/2017  . Solitary pulmonary nodule 10/07/2017  . Restrictive lung disease secondary to obesity 10/01/2017  . Polycythemia, secondary 10/01/2017  . Chronic viral hepatitis B without delta-agent ([place]) 07/08/2017  . COPD (chronic obstructive pulmonary disease) with chronic bronchitis ([place]) 06/27/2017  . Chronic kidney disease (CKD), stage IV (severe) ([place]) 01/14/2017  . Type 2 diabetes mellitus with stage 3 chronic kidney disease, with long-term current use of insulin ([place]) 12/12/2016  . Urge incontinence of urine 12/05/2016  . Trigger finger, left index finger 07/15/2016  . Back pain 04/07/2014  . Morbid obesity ([place]) 10/02/2012  . Chronic diastolic CHF (congestive heart failure) ([place]) 04/07/2012  . GERD 01/26/2010  . Obstructive sleep apnea treated with BiPAP 02/03/2008  . FIBROCYSTIC BREAST DISEASE 10/28/2006  . Hyperlipidemia 03/27/2006  . Obesity hypoventilation syndrome ([place]) 03/27/2006  . Depression with anxiety 03/27/2006    Conditions to be addressed/monitored:  Level of care concerns  Care Plan : Clinical Social Work  Updates made by [name], LCSW since 03/22/2020 12:00 AM  Problem: unable to meet ADL's   Goal: Independence Optimized with [name] Services   Start Date: 03/22/2020  Expected End Date: 05/27/2020  This Visit's Progress: On track  Priority: High  Current Barriers:    Patient unable to consistently perform activities of daily living and needs assistance and support in order to meet this unmet need  Currently unable to independently self manage needs related to chronic health conditions.   Knowledge Deficits related to short term plan for care coordination needs and long term plans for chronic disease management needs Clinical Goals: Over the next 45 to 60 days, patient will have personal care needs met as evident by having [name] Aide in the home assisting with needs.   Clinical Interventions : . Assessed needs, level of care concerns, basic eligibility and provided education on Personal Care Service process,  . Patient has selected an agency and just waiting for referral to be placed to [facility] . Collaborate with primary care provider ref completing [name] referral ( Referral placed in PCP's mailbox 03/22/2020 ) . Provided list of [name] agencies and what to expect with [name] process . [name] referral will be faxed to [name] at [phone] once completed and signed by PCP . LCSW will collaborate with [facility] to verify application is received  and processed.  . Other interventions provided: Emotional/Supportive Counseling Patient Goals/Self-Care Activities: Over the next 14 days . Return calls from [facility] or call them directly if you have questions [phone] or [phone]  Follow Up Plan: 04/04/2020    [name], [place] / [place]   [phone] 2:46 PM

## 2020-03-22 NOTE — Patient Instructions (Signed)
  [name]  it was nice speaking with you. Please call me directly [phone] if you have questions about the goals we discussed. Goals Addressed            This Visit's Progress   . Personal Care Services       Patient Goals/Self-Care Activities:  . I will fax the referral as soon as Dr Caron Presume completes the form . I will call you once the form is faxed . Return calls from [facility] or call them directly if you have questions [phone] or [phone]       [name] received Care Coordination services today:  1. Care Coordination services include personalized support from designated clinical staff supervised by her physician, including individualized plan of care and coordination with other care providers 2. 24/7 contact [phone] for assistance for urgent and routine care needs. 3. Care Coordination are voluntary services and be declined at any time by calling the office.  Patient verbalizes understanding of instructions provided today.    Follow up plan: Appointment scheduled for SW follow up with client by phone on: 04/04/2020  [name], LCSW

## 2020-03-22 NOTE — Chronic Care Management (AMB) (Signed)
  Care Management   Note  5/63/8756 Name: [name] MRN: [id number] DOB: [birth year]  [name] is a 55 y.o. year old female who is a primary care patient of [name], MD. I reached out to [name] by phone today in response to a referral sent by [name]'s PCP, [name], MD.  [name] was given information about care management services today including:  1. Care management services include personalized support from designated clinical staff supervised by her physician, including individualized plan of care and coordination with other care providers 2. 24/7 contact phone numbers for assistance for urgent and routine care needs. 3. The patient may stop care management services at any time by phone call to the office staff.  Patient agreed to services and verbal consent obtained.   Follow up plan: Telephone appointment with care management team member scheduled for: 03/22/2020  [place] [facility]

## 2020-03-23 NOTE — Telephone Encounter (Signed)
Called pt concerning mychart message. Pt state she periodically experience episodes where she feels a sensation come all over her body. Initially HR is low but will eventually increase and feels like her heart is pounding. Pt denies CP or SOB during symptoms. Pt also report once episode where she had pain in both jaws and felt like her jaws were locked. Pt state she could move and it scared her. Pt denies symptoms currently.   Nurse offered a first available appointment with a PA on 3/2 but pt refused and state she only want to see Dr. Harrell Gave. Appointment scheduled for 3/7. Nurse advised pt if symptoms worsen or she experience CP or feel like she is going to black out, to report to ER for further evaluations. Pt verbalized understanding.

## 2020-03-24 ENCOUNTER — Ambulatory Visit: Payer: 59 | Admitting: Licensed Clinical Social Worker

## 2020-03-24 DIAGNOSIS — Z741 Need for assistance with personal care: Secondary | ICD-10-CM

## 2020-03-24 NOTE — Patient Instructions (Signed)
  [name]  it was nice speaking with you. Please call me directly [phone] if you have questions about the goals we discussed. Goals Addressed            This Visit's Progress   . COMPLETED: Advance Directives      . Personal Care Services   On track     Patient Goals/Self-Care Activities:  . Call [facility] to schedule assessment [phone] or [phone]  . Call [facility] for assistance with your Will from the [place]      [name] received Care Coordination services today:  1. Care Coordination services include personalized support from designated clinical staff supervised by her physician, including individualized plan of care and coordination with other care providers 2. 24/7 contact [phone] for assistance for urgent and routine care needs. 3. Care Coordination are voluntary services and be declined at any time by calling the office.  Patient verbalizes understanding of instructions provided today.    Follow up plan: Appointment scheduled for SW follow up with client by phone on: 04/04/20  [name], LCSW

## 2020-03-24 NOTE — Chronic Care Management (AMB) (Signed)
Care Management Clinical Social Work Note  1/61/0960 Name: [name] MRN: [id number] DOB: [birth year]  [name] is a 55 y.o. year old female who is a primary care patient of [name], MD.  The Care Management team was consulted for assistance with chronic disease management and coordination needs.  Engaged with patient by telephone for follow up visit in response to provider referral for social work chronic care management and care coordination services  Consent to Services: Patient agreed to services and consent obtained.   Assessment: Patient is making progress with [name]. also provided her with additional information and resources.  See Care Plan below for interventions and patient self-care actives.  Follow up Plan: Patient would like continued follow-up.  CCM LCSW will f/u with patient 04/04/20. Patient will call office if needed prior to next encounter   Review of patient past medical history, allergies, medications, and health status, including review of relevant consultants reports was performed today as part of a comprehensive evaluation and provision of chronic care management and care coordination services.  SDOH (Social Determinants of Health) assessments and interventions performed:    Advanced Directives Status: See Vyn[employer] application for related entries.  Care Plan  Allergies  Allergen Reactions  . Aspirin Other (See Comments)    Kidney disease  . 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 Corticosteroids Other (See Comments)    Hyperglycemia   . Levofloxacin Itching    Outpatient Encounter Medications as of 03/24/2020  Medication Sig Note  . Accu-Chek Softclix Lancets lancets TEST 4 TIMES DAILY   . acetaminophen (TYLENOL) 500 MG tablet Take 1,000 mg by mouth 2 (two) times daily.   [name] Kitchen albuterol (VENTOLIN HFA) 108 (90 Base) MCG/ACT inhaler Inhale 2 puffs into the lungs every 6 (six) hours as needed for wheezing or shortness of  breath. 11/11/2019: Last dose last week Wednesday 10/6  . allopurinol (ZYLOPRIM) 300 MG tablet Take 300 mg by mouth daily.   [name] Kitchen ammonium lactate (LAC-HYDRIN) 12 % lotion APPLY TWICE A DAY AS NEEDED FOR DRY SKIN   . atorvastatin (LIPITOR) 40 MG tablet TAKE 1 TABLET BY MOUTH EVERY DAY   . b complex vitamins capsule Take 1 capsule by mouth daily.   . B-D UF III MINI PEN NEEDLES 31G X 5 MM MISC CHECK SUGARS 4 TIMES A DAY BEFORE MEALS AND AT BEDTIME.   . baclofen (LIORESAL) 10 MG tablet Take 1 tablet (10 mg total) by mouth 3 (three) times daily as needed for muscle spasms.   . blood glucose meter kit and supplies KIT Dispense based on patient and insurance preference. Use up to four times daily as directed. (FOR ICD-9 250.00, 250.01).   . busPIRone (BUSPAR) 10 MG tablet Take 1 tablet (10 mg total) by mouth 3 (three) times daily.   . diclofenac Sodium (VOLTAREN) 1 % GEL APPLY 2 GRAMS TO AFFECTED AREA 4 TIMES A DAY   . diltiazem (CARDIZEM CD) 120 MG 24 hr capsule TAKE 1 CAPSULE (120 MG TOTAL) BY MOUTH 2 (TWO) TIMES DAILY.   [name] Kitchen docusate sodium (COLACE) 100 MG capsule Take 200 mg by mouth 2 (two) times daily.   . empagliflozin (JARDIANCE) 25 MG TABS tablet Take 1 tablet (25 mg total) by mouth daily.   . fluticasone (FLONASE) 50 MCG/ACT nasal spray Place 2 sprays into both nostrils 2 (two) times daily.   . Fluticasone-Umeclidin-Vilant (TRELEGY ELLIPTA) 100-62.5-25 MCG/INH AEPB Inhale 1 puff into the lungs  daily.   . furosemide (LASIX) 40 MG tablet TAKE 2 TABLETS (80 MG TOTAL) BY MOUTH 2 (TWO) TIMES DAILY.   [name] Kitchen gabapentin (NEURONTIN) 300 MG capsule Take 2 capsules (600 mg total) by mouth 3 (three) times daily. TAKE 2 CAPSULES (600 MG TOTAL) BY MOUTH in the morning and at lunch and take 3 capsules at night   . glucose blood (ACCU-CHEK AVIVA PLUS) test strip 1 each by Other route 3 (three) times daily. TEST 3 TIMES DAILY   . HYDROcodone-acetaminophen (NORCO) 7.5-325 MG tablet Take 1 tablet by mouth every 6 (six)  hours as needed for moderate pain.   . hydrOXYzine (ATARAX/VISTARIL) 10 MG tablet Take 1 tablet (10 mg total) by mouth daily.   . insulin glargine (LANTUS SOLOSTAR) 100 UNIT/ML Solostar Pen Inject 50 Units into the skin daily.   . insulin lispro (HUMALOG) 100 UNIT/ML KwikPen Inject 18 Units into the skin 3 (three) times daily before meals.   [name] Kitchen ketoconazole (NIZORAL) 2 % cream APPLY 1 FINGERTIP AMOUNT TO EACH FOOT DAILY.   [name] Kitchen KLOR-CON M20 20 MEQ tablet TAKE 1 TABLET TWICE WEEKLY ON MONDAY AND THURSDAY   . loratadine (CLARITIN) 10 MG tablet TAKE 1 TABLET BY MOUTH EVERY DAY   . losartan (COZAAR) 100 MG tablet Take 1 tablet (100 mg total) by mouth daily.   . metolazone (ZAROXOLYN) 2.5 MG tablet Take 1 tablet (2.5 mg total) by mouth 2 (two) times a week. TAKE 1 TABLET TWICE WEEKLY ON MONDAYS AND THURSDAYS ONLY   . montelukast (SINGULAIR) 10 MG tablet TAKE 1 TABLET BY MOUTH EVERYDAY AT BEDTIME   . Multiple Vitamins-Minerals (MULTIVITAMIN WITH MINERALS) tablet Take 1 tablet by mouth daily.   [name] Kitchen NARCAN 4 MG/0.1ML LIQD nasal spray kit Place 1 spray into the nose as needed (accidental overdose). 07/27/2019: Never used  . nicotine polacrilex (NICORETTE) 4 MG gum Take 1 each (4 mg total) by mouth as needed for smoking cessation.   . NON FORMULARY Uses a C-PAP at bedtime   . nystatin (MYCOSTATIN) 100000 UNIT/ML suspension TAKE 5 MLS (500,000 UNITS TOTAL) BY MOUTH 4 (FOUR) TIMES DAILY. SWISH AND SWALLOW.   . Omega-3 Fatty Acids (FISH OIL) 1000 MG CAPS Take 1,000 mg by mouth daily.    [name] Kitchen omeprazole (PRILOSEC) 20 MG capsule TAKE 1 CAPSULE BY MOUTH EVERY DAY   . Semaglutide,0.25 or 0.5MG/DOS, (OZEMPIC, 0.25 OR 0.5 MG/DOSE,) 2 MG/1.5ML SOPN Inject 0.25 mg into the skin once a week. Minus 2 clicks   . varenicline (CHANTIX CONTINUING MONTH PAK) 1 MG tablet Take 1 tablet (1 mg total) by mouth 2 (two) times daily.   [name] Kitchen venlafaxine XR (EFFEXOR-XR) 75 MG 24 hr capsule TAKE 1 CAPSULE BY MOUTH DAILY WITH BREAKFAST.     Facility-Administered Encounter Medications as of 03/24/2020  Medication  . technetium tetrofosmin (TC-MYOVIEW) injection 16.1 millicurie    Patient Active Problem List   Diagnosis Date Noted  . Vaginal discharge 01/20/2020  . Prolapse of anterior vaginal wall 11/10/2019  . Skin ulcer of left thigh, limited to breakdown of skin ([place]) 09/09/2019  . Ulnar nerve compression 08/06/2019  . Diabetic neuropathy associated with type 2 diabetes mellitus ([place]) 08/06/2019  . Body mass index 50.0-59.9, adult ([place]) 06/12/2019  . Impingement syndrome of left shoulder 05/07/2019  . Polyp of colon   . Polyp of ascending colon   . Rectal discomfort 02/12/2019  . Osteoarthritis of right hip 02/12/2019  . Nontraumatic tear of left supraspinatus tendon 12/30/2018  . Gout 12/17/2018  .  Nontraumatic complete tear of right rotator cuff 12/08/2018  . Rotator cuff tear, right 12/01/2018  . Tobacco abuse 08/21/2018  . Right groin pain 07/20/2018  . Depressed mood 07/13/2018  . Paroxysmal SVT (supraventricular tachycardia) ([place]) 03/20/2018  . Pure hypercholesterolemia 03/20/2018  . Muscle cramping 02/25/2018  . Nodule of upper lobe of right lung 02/13/2018  . Falls, subsequent encounter 01/30/2018  . Pain in left foot 11/04/2017  . Essential hypertension 10/27/2017  . Solitary pulmonary nodule 10/07/2017  . Restrictive lung disease secondary to obesity 10/01/2017  . Polycythemia, secondary 10/01/2017  . Chronic viral hepatitis B without delta-agent ([place]) 07/08/2017  . COPD (chronic obstructive pulmonary disease) with chronic bronchitis ([place]) 06/27/2017  . Chronic kidney disease (CKD), stage IV (severe) ([place]) 01/14/2017  . Type 2 diabetes mellitus with stage 3 chronic kidney disease, with long-term current use of insulin ([place]) 12/12/2016  . Urge incontinence of urine 12/05/2016  . Trigger finger, left index finger 07/15/2016  . Back pain 04/07/2014  . Morbid obesity ([place]) 10/02/2012  . Chronic  diastolic CHF (congestive heart failure) ([place]) 04/07/2012  . GERD 01/26/2010  . Obstructive sleep apnea treated with BiPAP 02/03/2008  . FIBROCYSTIC BREAST DISEASE 10/28/2006  . Hyperlipidemia 03/27/2006  . Obesity hypoventilation syndrome ([place]) 03/27/2006  . Depression with anxiety 03/27/2006    Conditions to be addressed/monitored: Level of care concerns  Care Plan : Clinical Social Work  Updates made by [name], LCSW since 03/24/2020 12:00 AM  Problem: unable to meet ADL's   Goal: Independence Optimized with [name] Services   Start Date: 03/22/2020  Expected End Date: 05/27/2020  Recent Progress: On track  Priority: High  Current Barriers:    Patient unable to consistently perform activities of daily living and needs assistance and support in order to meet this unmet need  Currently unable to independently self manage needs related to chronic health conditions.   Knowledge Deficits related to short term plan for care coordination needs and long term plans for chronic disease management needs Clinical Goals: Over the next 45 to 60 days, patient will have personal care needs met as evident by having [name] Aide in the home assisting with needs.  Clinical Interventions : . Assessed needs, level of care concerns, basic eligibility and provided education on Personal Care Service process,  . Patient has selected an agency and just waiting for referral to be placed to [facility] . Collaborate with primary care provider ref completing [name] referral  . Provided list of [name] agencies and what to expect with [name] process . [name] referral faxed to [name] at [phone] 03/23/20 . LCSW will collaborate with [facility] to verify application is received and processed.  . Other interventions provided: Emotional/Supportive Counseling . Patient requested information about getting a Will: shared information [employer] refer. free [employer]. Patient Goals/Self-Care Activities: Over the next 14 days . Call [facility] to schedule assessment [phone] or [phone]  . Call [facility] for assistance with your Will from the [place] Follow Up Plan: 04/04/2020     [name], [place] / [place]   [phone] 2:15 PM

## 2020-03-29 ENCOUNTER — Encounter: Payer: Self-pay | Admitting: Physical Medicine and Rehabilitation

## 2020-03-29 ENCOUNTER — Ambulatory Visit (INDEPENDENT_AMBULATORY_CARE_PROVIDER_SITE_OTHER): Payer: 59 | Admitting: Physical Medicine and Rehabilitation

## 2020-03-29 ENCOUNTER — Ambulatory Visit: Payer: Self-pay

## 2020-03-29 ENCOUNTER — Other Ambulatory Visit: Payer: Self-pay

## 2020-03-29 VITALS — BP 136/91 | HR 89

## 2020-03-29 DIAGNOSIS — M5412 Radiculopathy, cervical region: Secondary | ICD-10-CM

## 2020-03-29 DIAGNOSIS — M961 Postlaminectomy syndrome, not elsewhere classified: Secondary | ICD-10-CM | POA: Diagnosis not present

## 2020-03-29 MED ORDER — BETAMETHASONE SOD PHOS & ACET 6 (3-3) MG/ML IJ SUSP
12.0000 mg | Freq: Once | INTRAMUSCULAR | Status: AC
Start: 2020-03-29 — End: 2020-03-29
  Administered 2020-03-29: 12 mg

## 2020-03-29 NOTE — Patient Instructions (Signed)

## 2020-03-29 NOTE — Progress Notes (Signed)
Pt state neck pain that travels down her left arm. Pt state everything makes the pain worse. Pt state she take pain meds to help ease the pain.   Numeric Pain Rating Scale and Functional Assessment Average Pain 10   In the last MONTH (on 0-10 scale) has pain interfered with the following?  1. General activity like being  able to carry out your everyday physical activities such as walking, climbing stairs, carrying groceries, or moving a chair?  Rating(10)   +Driver, -BT, -Dye Allergies.

## 2020-03-30 NOTE — Progress Notes (Signed)
[name] - 55 y.o. female MRN [id number]  Date of birth: 23-Jan-1966  Office Visit Note: Visit Date: 03/15/2020 PCP: [name], MD Referred by: [name], MD  Subjective: No chief complaint on file.  HPI:  [name] is a 55 y.o. female who comes in today At the request of Dr. [name] for planned right T12-L1 interlaminar epidural steroid injection.  Patient has tried and failed all manner of conservative nonconservative care including opioid pain management and surgery.  She has history of cervical and lumbar fusion.  She actually came in the office more recently for the same injection but left without treatment because she was frustrated that her neck was hurting worse and she really wanted a cervical spine injection at that time but this was not ordered.  We tried to explain it to her that day but she did leave.  She also failed to show the next day for electrodiagnostic study.  She has been on the schedule for electrodiagnostic study and has not obtained that.  I discussed that with her today she is on the schedule I more time and we discussed that she needed to make that appointment if she wanted to have that done otherwise we will schedule her with somebody else.  Nonetheless she is here today after speaking with Dr. Louanne Skye she does want to have the T12-L1 epidural injection.  I spoke with her at length about the cervical spine and she has had a great deal of issue there with prior surgeries and we did discuss that we can see her back in a couple weeks and complete or at least try to complete epidural injection below her cervical fusion.  ROS Otherwise per HPI.  Assessment & Plan: Visit Diagnoses:    ICD-10-CM   1. Lumbar radiculopathy  M54.16 XR C-ARM NO REPORT    Epidural Steroid injection    betamethasone acetate-betamethasone sodium phosphate (CELESTONE) injection 12 mg  2. Post laminectomy syndrome  M96.1 XR C-ARM NO REPORT    Epidural Steroid injection     betamethasone acetate-betamethasone sodium phosphate (CELESTONE) injection 12 mg    Plan: No additional findings.   Meds & Orders:  Meds ordered this encounter  Medications  . betamethasone acetate-betamethasone sodium phosphate (CELESTONE) injection 12 mg    Orders Placed This Encounter  Procedures  . XR C-ARM NO REPORT  . Epidural Steroid injection    Follow-up: Return if symptoms worsen or fail to improve.   Procedures: No procedures performed  Lumbar Epidural Steroid Injection - Interlaminar Approach with Fluoroscopic Guidance  Patient: [name]      Date of Birth: Feb 27, 1965 MRN: [id number] PCP: [name], MD      Visit Date: 03/15/2020   Universal Protocol:     Consent Given By: the patient  Position: PRONE  Additional Comments: Vital signs were monitored before and after the procedure. Patient was prepped and draped in the usual sterile fashion. The correct patient, procedure, and site was verified.   Injection Procedure Details:   Procedure diagnoses: Lumbar radiculopathy [M54.16]   Meds Administered:  Meds ordered this encounter  Medications  . betamethasone acetate-betamethasone sodium phosphate (CELESTONE) injection 12 mg     Laterality: Right  Location/Site:  T12-L1  Needle: 4.5 in., 20 ga. Tuohy  Needle Placement: Paramedian epidural  Findings:   -Comments: Excellent flow of contrast into the epidural space.  Procedure Details: Using a paramedian approach from the side mentioned above, the region overlying the inferior  lamina was localized under fluoroscopic visualization and the soft tissues overlying this structure were infiltrated with 4 ml. of 1% Lidocaine without Epinephrine. The Tuohy needle was inserted into the epidural space using a paramedian approach.   The epidural space was localized using loss of resistance along with counter oblique bi-planar fluoroscopic views.  After negative aspirate for air, blood, and CSF,  a 2 ml. volume of Isovue-250 was injected into the epidural space and the flow of contrast was observed. Radiographs were obtained for documentation purposes.    The injectate was administered into the level noted above.   Additional Comments:  The patient tolerated the procedure well Dressing: 2 x 2 sterile gauze and Band-Aid    Post-procedure details: Patient was observed during the procedure. Post-procedure instructions were reviewed.  Patient left the clinic in stable condition.    Clinical History: MRI CERVICAL SPINE WITHOUT CONTRAST  TECHNIQUE: Multiplanar, multisequence MR imaging of the cervical spine was performed. No intravenous contrast was administered.  COMPARISON:  September 04, 2017 cervical spine MRI  FINDINGS: Alignment: Straightening of normal cervical lordosis. No spondylolisthesis.  Vertebrae: Posterior spinal fusion hardware extends from C4-T2 with ACDF hardware at C7-T1  Cord: Normal signal and morphology.  Posterior Fossa, vertebral arteries, paraspinal tissues: Negative.  Disc levels:  C1-C2: Normal.  C2-C3: Normal disc space and facets. No spinal canal or neuroforaminal stenosis.  C3-C4: Posterior fusion. No spinal canal or neural foraminal stenosis.  C4-C5: Posterior fusion. No spinal canal or neural foraminal stenosis.  C5-C6: Posterior fusion. Small central disc protrusion is unchanged. No spinal canal or neural foraminal stenosis.  C6-C7: Laminectomy without spinal canal stenosis.  C7-T1: Laminectomy without spinal canal stenosis.  IMPRESSION: Unchanged postoperative appearance of the cervical spine with posterior fusion from C4-T2 and ACDF at C7-T1. No spinal canal stenosis or neural impingement.   Electronically Signed   By: Ulyses Jarred M.D.   On: 09/21/2018 23:14 --------------- 11/19/2018 EMG/NCS Impression: The above electrodiagnostic study is ABNORMAL and reveals evidence of a mild chronic C7 and C8  radiculopathy on the right.   There is also evidence of a borderline right median nerve entrapment at the wrist (carpal tunnel syndrome) affecting sensory components.   There is no significant electrodiagnostic evidence of any other focal nerve entrapment or brachial plexopathy.   Recommendations: 1. Follow-up with referring physician. 2. Continue current management of symptoms.  ___________________________ [name] FAAPMR     Objective:  VS:  HT:    WT:   BMI:     BP:(!) 148/95  HR:61bpm  TEMP: ( )  RESP:98 % Physical Exam Vitals and nursing note reviewed.  Constitutional:      General: She is not in acute distress.    Appearance: Normal appearance. She is obese. She is not ill-appearing.  HENT:     Head: Normocephalic and atraumatic.     Right Ear: External ear normal.     Left Ear: External ear normal.  Eyes:     Extraocular Movements: Extraocular movements intact.  Cardiovascular:     Rate and Rhythm: Normal rate.     Pulses: Normal pulses.  Pulmonary:     Effort: Pulmonary effort is normal. No respiratory distress.  Abdominal:     General: There is no distension.     Palpations: Abdomen is soft.  Musculoskeletal:        General: Tenderness present.     Cervical back: Neck supple.     Right lower leg: No edema.  Left lower leg: No edema.     Comments: Patient has good distal strength with no pain over the greater trochanters.  No clonus or focal weakness.  Skin:    Findings: No erythema, lesion or rash.  Neurological:     General: No focal deficit present.     Mental Status: She is alert and oriented to person, place, and time.     Sensory: No sensory deficit.     Motor: No weakness or abnormal muscle tone.     Coordination: Coordination normal.  Psychiatric:        Mood and Affect: Mood normal.        Behavior: Behavior normal.      Imaging: Epidural Steroid injection  Result Date: 03/29/2020 Magnus Sinning, MD     03/30/2020 12:48 PM  Cervical Epidural Steroid Injection - Interlaminar Approach with Fluoroscopic Guidance Patient: SAIMA MONTERROSO     Date of Birth: Jan 08, 1966 MRN: [id number] PCP: [name], MD     Visit Date: 03/29/2020  Universal Protocol:   Date/Time: 03/31/2210:45 PM Consent Given By: the patient Position: PRONE Additional Comments: Vital signs were monitored before and after the procedure. Patient was prepped and draped in the usual sterile fashion. The correct patient, procedure, and site was verified. Injection Procedure Details: Procedure diagnoses: Cervical radiculopathy [M54.12]  Meds Administered: Meds ordered this encounter Medications . betamethasone acetate-betamethasone sodium phosphate (CELESTONE) injection 12 mg  Laterality: Left Location/Site: T2-3 Needle: 4.5 in., 20 ga. Tuohy Needle Placement: Paramedian epidural space Findings:  -Comments: Interestingly there was excellent imaging of the cervical thoracic area at this level but as we got closer to the lamina there was either scar tissue or fibrotic tissue or potentially fusion and there which is no way to obtain good loss of resistance or good flow of contrast. Procedure Details: Using a paramedian approach from the side mentioned above, the region overlying the inferior lamina was localized under fluoroscopic visualization and the soft tissues overlying this structure were infiltrated with 4 ml. of 1% Lidocaine without Epinephrine. A # 20 gauge, Tuohy needle was inserted into the epidural space using a paramedian approach. The epidural space was localized using loss of resistance along with contralateral oblique bi-planar fluoroscopic views.  After negative aspirate for air, blood, and CSF, a 2 ml. volume of Isovue-250 was injected into the epidural space and the flow of contrast was observed. Radiographs were obtained for documentation purposes. The injectate was administered into the level noted above. Additional Comments: No complications occurred  Dressing: 2 x 2 sterile gauze and Band-Aid  Post-procedure details: Patient was observed during the procedure. Post-procedure instructions were reviewed. Patient left the clinic in stable condition.   XR C-ARM NO REPORT  Result Date: 03/29/2020 Please see Notes tab for imaging impression.

## 2020-03-30 NOTE — Progress Notes (Signed)
[name] - 55 y.o. female MRN [id number]  Date of birth: 05-04-1965  Office Visit Note: Visit Date: 03/29/2020 PCP: [name], MD Referred by: [name], MD  Subjective: Chief Complaint  Patient presents with  . Neck - Pain  . Left Arm - Pain   HPI:  [name] is a 55 y.o. female who comes in today For planned left T2-3 interlaminar epidural steroid injection.  She represents a very difficult case with prior posterior and anterior fusions of the cervical spine.  Last surgery performed was a C7-T1 anterior cervical discectomy fusion.  She has had posterior fusion with rods.  It appears that looking at the last 2 MRIs that there is ability to perform the injection below the level of the posterior fusion at T2.  I had agreed to try the injection after we spoke with her more recently doing a lumbar injection.  She will follow up with Dr. [name].  As noted in her procedure report she represent a very difficult case with her body habitus as well as prior surgery.  There was a lot of scar tissue noted in the area and not a great deal of infield with trying to get loss of resistance.  Biplanar imaging did show the needle tip just at the level underneath the lamina but there was really no flow of contrast at that point.  We did deliver the injectate at that level.  In the future I would not attempt to try epidural injection in this area Lambert safety concerns.  ROS Otherwise per HPI.  Assessment & Plan: Visit Diagnoses:    ICD-10-CM   1. Cervical radiculopathy  M54.12 XR C-ARM NO REPORT    Epidural Steroid injection    betamethasone acetate-betamethasone sodium phosphate (CELESTONE) injection 12 mg  2. Post laminectomy syndrome  M96.1 XR C-ARM NO REPORT    Epidural Steroid injection    betamethasone acetate-betamethasone sodium phosphate (CELESTONE) injection 12 mg    Plan: No additional findings.   Meds & Orders:  Meds ordered this encounter  Medications  .  betamethasone acetate-betamethasone sodium phosphate (CELESTONE) injection 12 mg    Orders Placed This Encounter  Procedures  . XR C-ARM NO REPORT  . Epidural Steroid injection    Follow-up: Return for Dr. Louanne Skye as scheduled.   Procedures: No procedures performed  Cervical Epidural Steroid Injection - Interlaminar Approach with Fluoroscopic Guidance  Patient: [name]      Date of Birth: 1965-07-08 MRN: [id number] PCP: [name], MD      Visit Date: 03/29/2020   Universal Protocol:    Date/Time: 03/31/2210:45 PM  Consent Given By: the patient  Position: PRONE  Additional Comments: Vital signs were monitored before and after the procedure. Patient was prepped and draped in the usual sterile fashion. The correct patient, procedure, and site was verified.   Injection Procedure Details:   Procedure diagnoses: Cervical radiculopathy [M54.12]    Meds Administered:  Meds ordered this encounter  Medications  . betamethasone acetate-betamethasone sodium phosphate (CELESTONE) injection 12 mg     Laterality: Left  Location/Site: T2-3  Needle: 4.5 in., 20 ga. Tuohy  Needle Placement: Paramedian epidural space  Findings:  -Comments: Interestingly there was excellent imaging of the cervical thoracic area at this level but as we got closer to the lamina there was either scar tissue or fibrotic tissue or potentially fusion and there which is no way to obtain good loss of resistance or good flow of contrast.  Procedure Details: Using a paramedian approach from the side mentioned above, the region overlying the inferior lamina was localized under fluoroscopic visualization and the soft tissues overlying this structure were infiltrated with 4 ml. of 1% Lidocaine without Epinephrine. A # 20 gauge, Tuohy needle was inserted into the epidural space using a paramedian approach.  The epidural space was localized using loss of resistance along with contralateral oblique  bi-planar fluoroscopic views.  After negative aspirate for air, blood, and CSF, a 2 ml. volume of Isovue-250 was injected into the epidural space and the flow of contrast was observed. Radiographs were obtained for documentation purposes.   The injectate was administered into the level noted above.   Additional Comments:  No complications occurred Dressing: 2 x 2 sterile gauze and Band-Aid    Post-procedure details: Patient was observed during the procedure. Post-procedure instructions were reviewed.  Patient left the clinic in stable condition.     Clinical History: MRI CERVICAL SPINE WITHOUT CONTRAST  TECHNIQUE: Multiplanar, multisequence MR imaging of the cervical spine was performed. No intravenous contrast was administered.  COMPARISON:  September 04, 2017 cervical spine MRI  FINDINGS: Alignment: Straightening of normal cervical lordosis. No spondylolisthesis.  Vertebrae: Posterior spinal fusion hardware extends from C4-T2 with ACDF hardware at C7-T1  Cord: Normal signal and morphology.  Posterior Fossa, vertebral arteries, paraspinal tissues: Negative.  Disc levels:  C1-C2: Normal.  C2-C3: Normal disc space and facets. No spinal canal or neuroforaminal stenosis.  C3-C4: Posterior fusion. No spinal canal or neural foraminal stenosis.  C4-C5: Posterior fusion. No spinal canal or neural foraminal stenosis.  C5-C6: Posterior fusion. Small central disc protrusion is unchanged. No spinal canal or neural foraminal stenosis.  C6-C7: Laminectomy without spinal canal stenosis.  C7-T1: Laminectomy without spinal canal stenosis.  IMPRESSION: Unchanged postoperative appearance of the cervical spine with posterior fusion from C4-T2 and ACDF at C7-T1. No spinal canal stenosis or neural impingement.   Electronically Signed   By: Ulyses Jarred M.D.   On: 09/21/2018 23:14 --------------- 11/19/2018 EMG/NCS Impression: The above electrodiagnostic  study is ABNORMAL and reveals evidence of a mild chronic C7 and C8 radiculopathy on the right.   There is also evidence of a borderline right median nerve entrapment at the wrist (carpal tunnel syndrome) affecting sensory components.   There is no significant electrodiagnostic evidence of any other focal nerve entrapment or brachial plexopathy.   Recommendations: 1. Follow-up with referring physician. 2. Continue current management of symptoms.  ___________________________ Laurence Spates FAAPMR     Objective:  VS:  HT:    WT:   BMI:     BP:(!) 136/91  HR:89bpm  TEMP: ( )  RESP:  Physical Exam Vitals and nursing note reviewed.  Constitutional:      General: She is not in acute distress.    Appearance: Normal appearance. She is obese. She is not ill-appearing.  HENT:     Head: Normocephalic and atraumatic.     Right Ear: External ear normal.     Left Ear: External ear normal.  Eyes:     Extraocular Movements: Extraocular movements intact.  Cardiovascular:     Rate and Rhythm: Normal rate.     Pulses: Normal pulses.  Musculoskeletal:     Cervical back: Tenderness present. No rigidity.     Right lower leg: No edema.     Left lower leg: No edema.     Comments: Patient has good strength in the upper extremities including 5 out of 5 strength in  wrist extension long finger flexion and APB.  There is no atrophy of the hands intrinsically.  There is a negative Hoffmann's test.   Lymphadenopathy:     Cervical: No cervical adenopathy.  Skin:    Findings: No erythema, lesion or rash.  Neurological:     General: No focal deficit present.     Mental Status: She is alert and oriented to person, place, and time.     Sensory: No sensory deficit.     Motor: No weakness or abnormal muscle tone.     Coordination: Coordination normal.  Psychiatric:        Mood and Affect: Mood normal.        Behavior: Behavior normal.      Imaging: XR C-ARM NO REPORT  Result Date:  03/29/2020 Please see Notes tab for imaging impression.

## 2020-03-30 NOTE — Procedures (Signed)
Cervical Epidural Steroid Injection - Interlaminar Approach with Fluoroscopic Guidance  Patient: [name]      Date of Birth: 10/26/1965 MRN: [id number] PCP: [name], MD      Visit Date: 03/29/2020   Universal Protocol:    Date/Time: 03/31/2210:45 PM  Consent Given By: the patient  Position: PRONE  Additional Comments: Vital signs were monitored before and after the procedure. Patient was prepped and draped in the usual sterile fashion. The correct patient, procedure, and site was verified.   Injection Procedure Details:   Procedure diagnoses: Cervical radiculopathy [M54.12]    Meds Administered:  Meds ordered this encounter  Medications  . betamethasone acetate-betamethasone sodium phosphate (CELESTONE) injection 12 mg     Laterality: Left  Location/Site: T2-3  Needle: 4.5 in., 20 ga. Tuohy  Needle Placement: Paramedian epidural space  Findings:  -Comments: Interestingly there was excellent imaging of the cervical thoracic area at this level but as we got closer to the lamina there was either scar tissue or fibrotic tissue or potentially fusion and there which is no way to obtain good loss of resistance or good flow of contrast.  Procedure Details: Using a paramedian approach from the side mentioned above, the region overlying the inferior lamina was localized under fluoroscopic visualization and the soft tissues overlying this structure were infiltrated with 4 ml. of 1% Lidocaine without Epinephrine. A # 20 gauge, Tuohy needle was inserted into the epidural space using a paramedian approach.  The epidural space was localized using loss of resistance along with contralateral oblique bi-planar fluoroscopic views.  After negative aspirate for air, blood, and CSF, a 2 ml. volume of Isovue-250 was injected into the epidural space and the flow of contrast was observed. Radiographs were obtained for documentation purposes.   The injectate was administered into  the level noted above.   Additional Comments:  No complications occurred Dressing: 2 x 2 sterile gauze and Band-Aid    Post-procedure details: Patient was observed during the procedure. Post-procedure instructions were reviewed.  Patient left the clinic in stable condition.

## 2020-03-30 NOTE — Procedures (Signed)
Lumbar Epidural Steroid Injection - Interlaminar Approach with Fluoroscopic Guidance  Patient: [name]      Date of Birth: Jun 25, 1965 MRN: [id number] PCP: [name], MD      Visit Date: 03/15/2020   Universal Protocol:     Consent Given By: the patient  Position: PRONE  Additional Comments: Vital signs were monitored before and after the procedure. Patient was prepped and draped in the usual sterile fashion. The correct patient, procedure, and site was verified.   Injection Procedure Details:   Procedure diagnoses: Lumbar radiculopathy [M54.16]   Meds Administered:  Meds ordered this encounter  Medications  . betamethasone acetate-betamethasone sodium phosphate (CELESTONE) injection 12 mg     Laterality: Right  Location/Site:  T12-L1  Needle: 4.5 in., 20 ga. Tuohy  Needle Placement: Paramedian epidural  Findings:   -Comments: Excellent flow of contrast into the epidural space.  Procedure Details: Using a paramedian approach from the side mentioned above, the region overlying the inferior lamina was localized under fluoroscopic visualization and the soft tissues overlying this structure were infiltrated with 4 ml. of 1% Lidocaine without Epinephrine. The Tuohy needle was inserted into the epidural space using a paramedian approach.   The epidural space was localized using loss of resistance along with counter oblique bi-planar fluoroscopic views.  After negative aspirate for air, blood, and CSF, a 2 ml. volume of Isovue-250 was injected into the epidural space and the flow of contrast was observed. Radiographs were obtained for documentation purposes.    The injectate was administered into the level noted above.   Additional Comments:  The patient tolerated the procedure well Dressing: 2 x 2 sterile gauze and Band-Aid    Post-procedure details: Patient was observed during the procedure. Post-procedure instructions were reviewed.  Patient left the  clinic in stable condition.

## 2020-04-03 ENCOUNTER — Other Ambulatory Visit: Payer: Self-pay | Admitting: Family Medicine

## 2020-04-03 ENCOUNTER — Other Ambulatory Visit: Payer: Self-pay | Admitting: Podiatry

## 2020-04-03 ENCOUNTER — Ambulatory Visit: Payer: 59 | Admitting: Cardiology

## 2020-04-03 DIAGNOSIS — B353 Tinea pedis: Secondary | ICD-10-CM

## 2020-04-03 DIAGNOSIS — E1151 Type 2 diabetes mellitus with diabetic peripheral angiopathy without gangrene: Secondary | ICD-10-CM

## 2020-04-04 ENCOUNTER — Encounter: Payer: Self-pay | Admitting: Licensed Clinical Social Worker

## 2020-04-04 ENCOUNTER — Other Ambulatory Visit: Payer: Self-pay | Admitting: *Deleted

## 2020-04-04 ENCOUNTER — Ambulatory Visit: Payer: 59 | Admitting: Licensed Clinical Social Worker

## 2020-04-04 DIAGNOSIS — Z741 Need for assistance with personal care: Secondary | ICD-10-CM

## 2020-04-04 NOTE — Telephone Encounter (Signed)
Please advise 

## 2020-04-04 NOTE — Chronic Care Management (AMB) (Signed)
Care Management Clinical Social Work Note  08/01/9161 Name: [name] MRN: [id number] DOB: [birth year]  [name] is a 55 y.o. year old female who is a primary care patient of [name], MD.  The Care Management team was consulted for assistance with chronic disease management and coordination needs.  Engaged with patient by telephone for follow up visit in response to provider referral for social work chronic care management and care coordination services  Consent to Services:  Patient agreed to services and consent obtained.   Assessment: Patient is making progress with and has completed goal of obtaining [name]. ( See Care Plan below for interventions and patient self-care actives).  Patient denies needing any further assistance; No new needs identified or goals established.   Follow up Plan: Patient does not require or desire continued follow-up.  States she will contact the office if needed. LCSW will disconnect from care team if no needs are identified in the next 90 days.    Review of patient past medical history, allergies, medications, and health status, including review of relevant consultants reports was performed today as part of a comprehensive evaluation and provision of chronic care management and care coordination services.  SDOH (Social Determinants of Health) assessments and interventions performed:    Advanced Directives Status: See Vynca application for related entries.  Care Plan  Allergies  Allergen Reactions  . Aspirin Other (See Comments)    Kidney disease  . 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 Corticosteroids Other (See Comments)    Hyperglycemia   . Levofloxacin Itching    Outpatient Encounter Medications as of 04/04/2020  Medication Sig Note  . Accu-Chek Softclix Lancets lancets TEST 4 TIMES DAILY   . acetaminophen (TYLENOL) 500 MG tablet Take 1,000 mg by mouth 2 (two) times daily.   [name] Kitchen albuterol (VENTOLIN HFA)  108 (90 Base) MCG/ACT inhaler Inhale 2 puffs into the lungs every 6 (six) hours as needed for wheezing or shortness of breath. 11/11/2019: Last dose last week Wednesday 10/6  . allopurinol (ZYLOPRIM) 300 MG tablet Take 300 mg by mouth daily.   [name] Kitchen ammonium lactate (LAC-HYDRIN) 12 % lotion APPLY TWICE A DAY AS NEEDED FOR DRY SKIN   . atorvastatin (LIPITOR) 40 MG tablet TAKE 1 TABLET BY MOUTH EVERY DAY   . b complex vitamins capsule Take 1 capsule by mouth daily.   . B-D UF III MINI PEN NEEDLES 31G X 5 MM MISC CHECK SUGARS 4 TIMES A DAY BEFORE MEALS AND AT BEDTIME.   . baclofen (LIORESAL) 10 MG tablet Take 1 tablet (10 mg total) by mouth 3 (three) times daily as needed for muscle spasms.   . blood glucose meter kit and supplies KIT Dispense based on patient and insurance preference. Use up to four times daily as directed. (FOR ICD-9 250.00, 250.01).   . busPIRone (BUSPAR) 10 MG tablet Take 1 tablet (10 mg total) by mouth 3 (three) times daily.   . diclofenac Sodium (VOLTAREN) 1 % GEL APPLY 2 GRAMS TO AFFECTED AREA 4 TIMES A DAY   . diltiazem (CARDIZEM CD) 120 MG 24 hr capsule TAKE 1 CAPSULE (120 MG TOTAL) BY MOUTH 2 (TWO) TIMES DAILY.   [name] Kitchen docusate sodium (COLACE) 100 MG capsule Take 200 mg by mouth 2 (two) times daily.   . empagliflozin (JARDIANCE) 25 MG TABS tablet Take 1 tablet (25 mg total) by mouth daily.   . fluticasone (FLONASE) 50 MCG/ACT nasal spray  Place 2 sprays into both nostrils 2 (two) times daily.   . Fluticasone-Umeclidin-Vilant (TRELEGY ELLIPTA) 100-62.5-25 MCG/INH AEPB Inhale 1 puff into the lungs daily.   . furosemide (LASIX) 40 MG tablet TAKE 2 TABLETS (80 MG TOTAL) BY MOUTH 2 (TWO) TIMES DAILY.   [name] Kitchen gabapentin (NEURONTIN) 300 MG capsule Take 2 capsules (600 mg total) by mouth 3 (three) times daily. TAKE 2 CAPSULES (600 MG TOTAL) BY MOUTH in the morning and at lunch and take 3 capsules at night   . glucose blood (ACCU-CHEK AVIVA PLUS) test strip 1 each by Other route 3 (three) times  daily. TEST 3 TIMES DAILY   . HYDROcodone-acetaminophen (NORCO) 7.5-325 MG tablet Take 1 tablet by mouth every 6 (six) hours as needed for moderate pain.   . hydrOXYzine (ATARAX/VISTARIL) 10 MG tablet Take 1 tablet (10 mg total) by mouth daily.   . insulin glargine (LANTUS SOLOSTAR) 100 UNIT/ML Solostar Pen Inject 50 Units into the skin daily.   . insulin lispro (HUMALOG) 100 UNIT/ML KwikPen Inject 18 Units into the skin 3 (three) times daily before meals.   [name] Kitchen ketoconazole (NIZORAL) 2 % cream APPLY 1 FINGERTIP AMOUNT TO EACH FOOT DAILY.   [name] Kitchen KLOR-CON M20 20 MEQ tablet TAKE 1 TABLET TWICE WEEKLY ON MONDAY AND THURSDAY   . loratadine (CLARITIN) 10 MG tablet TAKE 1 TABLET BY MOUTH EVERY DAY   . losartan (COZAAR) 100 MG tablet Take 1 tablet (100 mg total) by mouth daily.   . metolazone (ZAROXOLYN) 2.5 MG tablet Take 1 tablet (2.5 mg total) by mouth 2 (two) times a week. TAKE 1 TABLET TWICE WEEKLY ON MONDAYS AND THURSDAYS ONLY   . montelukast (SINGULAIR) 10 MG tablet TAKE 1 TABLET BY MOUTH EVERYDAY AT BEDTIME   . Multiple Vitamins-Minerals (MULTIVITAMIN WITH MINERALS) tablet Take 1 tablet by mouth daily.   [name] Kitchen NARCAN 4 MG/0.1ML LIQD nasal spray kit Place 1 spray into the nose as needed (accidental overdose). 07/27/2019: Never used  . nicotine polacrilex (NICORETTE) 4 MG gum Take 1 each (4 mg total) by mouth as needed for smoking cessation.   . NON FORMULARY Uses a C-PAP at bedtime   . nystatin (MYCOSTATIN) 100000 UNIT/ML suspension TAKE 5 MLS (500,000 UNITS TOTAL) BY MOUTH 4 (FOUR) TIMES DAILY. SWISH AND SWALLOW.   . Omega-3 Fatty Acids (FISH OIL) 1000 MG CAPS Take 1,000 mg by mouth daily.    [name] Kitchen omeprazole (PRILOSEC) 20 MG capsule TAKE 1 CAPSULE BY MOUTH EVERY DAY   . Semaglutide,0.25 or 0.5MG/DOS, (OZEMPIC, 0.25 OR 0.5 MG/DOSE,) 2 MG/1.5ML SOPN Inject 0.25 mg into the skin once a week. Minus 2 clicks   . varenicline (CHANTIX CONTINUING MONTH PAK) 1 MG tablet Take 1 tablet (1 mg total) by mouth 2 (two)  times daily.   [name] Kitchen venlafaxine XR (EFFEXOR-XR) 75 MG 24 hr capsule TAKE 1 CAPSULE BY MOUTH DAILY WITH BREAKFAST.    Facility-Administered Encounter Medications as of 04/04/2020  Medication  . technetium tetrofosmin (TC-MYOVIEW) injection 43.3 millicurie    Patient Active Problem List   Diagnosis Date Noted  . Vaginal discharge 01/20/2020  . Prolapse of anterior vaginal wall 11/10/2019  . Skin ulcer of left thigh, limited to breakdown of skin ([place]) 09/09/2019  . Ulnar nerve compression 08/06/2019  . Diabetic neuropathy associated with type 2 diabetes mellitus ([place]) 08/06/2019  . Body mass index 50.0-59.9, adult ([place]) 06/12/2019  . Impingement syndrome of left shoulder 05/07/2019  . Polyp of colon   . Polyp of ascending colon   .  Rectal discomfort 02/12/2019  . Osteoarthritis of right hip 02/12/2019  . Nontraumatic tear of left supraspinatus tendon 12/30/2018  . Gout 12/17/2018  . Nontraumatic complete tear of right rotator cuff 12/08/2018  . Rotator cuff tear, right 12/01/2018  . Tobacco abuse 08/21/2018  . Right groin pain 07/20/2018  . Depressed mood 07/13/2018  . Paroxysmal SVT (supraventricular tachycardia) ([place]) 03/20/2018  . Pure hypercholesterolemia 03/20/2018  . Muscle cramping 02/25/2018  . Nodule of upper lobe of right lung 02/13/2018  . Falls, subsequent encounter 01/30/2018  . Pain in left foot 11/04/2017  . Essential hypertension 10/27/2017  . Solitary pulmonary nodule 10/07/2017  . Restrictive lung disease secondary to obesity 10/01/2017  . Polycythemia, secondary 10/01/2017  . Chronic viral hepatitis B without delta-agent ([place]) 07/08/2017  . COPD (chronic obstructive pulmonary disease) with chronic bronchitis ([place]) 06/27/2017  . Chronic kidney disease (CKD), stage IV (severe) ([place]) 01/14/2017  . Type 2 diabetes mellitus with stage 3 chronic kidney disease, with long-term current use of insulin ([place]) 12/12/2016  . Urge incontinence of urine 12/05/2016  . Trigger  finger, left index finger 07/15/2016  . Back pain 04/07/2014  . Morbid obesity ([place]) 10/02/2012  . Chronic diastolic CHF (congestive heart failure) ([place]) 04/07/2012  . GERD 01/26/2010  . Obstructive sleep apnea treated with BiPAP 02/03/2008  . FIBROCYSTIC BREAST DISEASE 10/28/2006  . Hyperlipidemia 03/27/2006  . Obesity hypoventilation syndrome ([place]) 03/27/2006  . Depression with anxiety 03/27/2006    Conditions to be addressed/monitored:  Level of care concerns  Care Plan : Clinical Social Work  Updates made by [name], LCSW since 04/04/2020 12:00 AM  Problem: unable to meet ADL's   Goal: Independence Optimized with [name] Services Completed 04/04/2020  Start Date: 03/22/2020  Expected End Date: 05/27/2020  This Visit's Progress: On track  Recent Progress: On track  Priority: High  Current Barriers:    Patient unable to consistently perform activities of daily living and needs assistance and support in order to meet this unmet need  Currently unable to independently self manage needs related to chronic health conditions.   Knowledge Deficits related to short term plan for care coordination needs and long term plans for chronic disease management needs Clinical Goals: Over the next 30 days, patient will have personal care needs met as evident by having [name] Aide in the home assisting with needs.  Clinical Interventions : . Assessed needs, level of care concerns, basic eligibility and provided education on Personal Care Service process,  . Patient has selected an agency and just waiting for referral to be placed to [facility] . Collaborate with primary care provider ref completing [name] referral  . [name] referral faxed to [facility] at [phone] 03/23/20 . LCSW collaborated with [facility] to verify application is received and processed . [name] assessment completed 04/03/20; has selected [facility] as her agency . Anticipates service starting  within the next week. Patient Goals/Self-Care Activities: Over the next 14 days . Call [facility] if you have questions [phone] or [place], [place] / [place]   [phone] 3:37 PM

## 2020-04-04 NOTE — Patient Instructions (Signed)
  [name]  it was nice speaking with you. Please call me directly [phone] if you have questions about the goals we discussed. Goals Addressed            This Visit's Progress   . COMPLETED: Personal Care Services        Patient Goals/Self-Care Activities:  . Call [facility] to schedule assessment [phone] or [phone]       [name] received Care Coordination services today:  1. Care Coordination services include personalized support from designated clinical staff supervised by her physician, including individualized plan of care and coordination with other care providers 2. 24/7 contact [phone] for assistance for urgent and routine care needs. 3. Care Coordination are voluntary services and be declined at any time by calling the office.  Patient verbalizes understanding of instructions provided today.    Follow up plan: patient will call office if needed  [name], LCSW

## 2020-04-06 ENCOUNTER — Encounter: Payer: Self-pay | Admitting: Family Medicine

## 2020-04-06 ENCOUNTER — Ambulatory Visit: Payer: 59 | Admitting: Licensed Clinical Social Worker

## 2020-04-06 DIAGNOSIS — Z741 Need for assistance with personal care: Secondary | ICD-10-CM

## 2020-04-07 NOTE — Chronic Care Management (AMB) (Signed)
Care Management Clinical Social Work Note  04/07/2020 (  Late entry) Name: MARKETA MIDKIFF MRN: [id number] DOB: [birth year]  [name] is a 55 y.o. year old female who is a primary care patient of [name], MD.  The Care Management team was consulted for assistance with chronic disease management and coordination needs.  Engaged with patient by telephone 04/06/09 for follow up visit in response to provider referral for social work chronic care management and care coordination services  Consent to Services:  Patient agreed to services and consent obtained.   Assessment: [name] services have started but patient was only approved for 46 hours.  She previously received 80 hours. Per Janeece Riggers patient's PCP would need to submit a new [place] form with change in medical status in order to be considered for additional hours. (See Care Plan below for interventions and patient self-care actives).  Follow up Plan: Form placed in mailbox for PCP.  LCSW will coordinate and f/u with patient after form is completed and faxed to [facility].    Review of patient past medical history, allergies, medications, and health status, including review of relevant consultants reports was performed today as part of a comprehensive evaluation and provision of chronic care management and care coordination services.  SDOH (Social Determinants of Health) assessments and interventions performed:    Advanced Directives Status: See [employer] application for related entries.  Care Plan  Allergies  Allergen Reactions   Aspirin Other (See Comments)    Kidney disease   Methadone Hcl Other (See Comments)     "blacked out" in 1990's   Corticosteroids Other (See Comments)    Hyperglycemia    Levofloxacin Itching    Outpatient Encounter Medications as of 04/06/2020  Medication Sig Note   ketoconazole (NIZORAL) 2 % cream APPLY 1 FINGERTIP AMOUNT TO EACH FOOT DAILY.    Accu-Chek Softclix Lancets lancets  TEST 4 TIMES DAILY    acetaminophen (TYLENOL) 500 MG tablet Take 1,000 mg by mouth 2 (two) times daily.    albuterol (VENTOLIN HFA) 108 (90 Base) MCG/ACT inhaler Inhale 2 puffs into the lungs every 6 (six) hours as needed for wheezing or shortness of breath. 11/11/2019: Last dose last week Wednesday 10/6   allopurinol (ZYLOPRIM) 300 MG tablet Take 300 mg by mouth daily.    ammonium lactate (LAC-HYDRIN) 12 % lotion APPLY TWICE A DAY AS NEEDED FOR DRY SKIN    atorvastatin (LIPITOR) 40 MG tablet TAKE 1 TABLET BY MOUTH EVERY DAY    b complex vitamins capsule Take 1 capsule by mouth daily.    B-D UF III MINI PEN NEEDLES 31G X 5 MM MISC CHECK SUGARS 4 TIMES A DAY BEFORE MEALS AND AT BEDTIME.    baclofen (LIORESAL) 10 MG tablet Take 1 tablet (10 mg total) by mouth 3 (three) times daily as needed for muscle spasms.    blood glucose meter kit and supplies KIT Dispense based on patient and insurance preference. Use up to four times daily as directed. (FOR ICD-9 250.00, 250.01).    busPIRone (BUSPAR) 10 MG tablet Take 1 tablet (10 mg total) by mouth 3 (three) times daily.    diclofenac Sodium (VOLTAREN) 1 % GEL APPLY 2 GRAMS TO AFFECTED AREA 4 TIMES A DAY    diltiazem (CARDIZEM CD) 120 MG 24 hr capsule TAKE 1 CAPSULE (120 MG TOTAL) BY MOUTH 2 (TWO) TIMES DAILY.    docusate sodium (COLACE) 100 MG capsule Take 200 mg by mouth 2 (two) times  daily.    empagliflozin (JARDIANCE) 25 MG TABS tablet Take 1 tablet (25 mg total) by mouth daily.    fluticasone (FLONASE) 50 MCG/ACT nasal spray Place 2 sprays into both nostrils 2 (two) times daily.    Fluticasone-Umeclidin-Vilant (TRELEGY ELLIPTA) 100-62.5-25 MCG/INH AEPB Inhale 1 puff into the lungs daily.    furosemide (LASIX) 40 MG tablet TAKE 2 TABLETS (80 MG TOTAL) BY MOUTH 2 (TWO) TIMES DAILY.    gabapentin (NEURONTIN) 300 MG capsule Take 2 capsules (600 mg total) by mouth 3 (three) times daily. TAKE 2 CAPSULES (600 MG TOTAL) BY MOUTH in the morning  and at lunch and take 3 capsules at night    glucose blood (ACCU-CHEK AVIVA PLUS) test strip 1 each by Other route 3 (three) times daily. TEST 3 TIMES DAILY    HYDROcodone-acetaminophen (NORCO) 7.5-325 MG tablet Take 1 tablet by mouth every 6 (six) hours as needed for moderate pain.    hydrOXYzine (ATARAX/VISTARIL) 10 MG tablet Take 1 tablet (10 mg total) by mouth daily.    insulin glargine (LANTUS SOLOSTAR) 100 UNIT/ML Solostar Pen Inject 50 Units into the skin daily.    insulin lispro (HUMALOG) 100 UNIT/ML KwikPen Inject 18 Units into the skin 3 (three) times daily before meals.    KLOR-CON M20 20 MEQ tablet TAKE 1 TABLET TWICE WEEKLY ON MONDAY AND THURSDAY    loratadine (CLARITIN) 10 MG tablet TAKE 1 TABLET BY MOUTH EVERY DAY    losartan (COZAAR) 100 MG tablet Take 1 tablet (100 mg total) by mouth daily.    metolazone (ZAROXOLYN) 2.5 MG tablet Take 1 tablet (2.5 mg total) by mouth 2 (two) times a week. TAKE 1 TABLET TWICE WEEKLY ON MONDAYS AND THURSDAYS ONLY    montelukast (SINGULAIR) 10 MG tablet TAKE 1 TABLET BY MOUTH EVERYDAY AT BEDTIME    Multiple Vitamins-Minerals (MULTIVITAMIN WITH MINERALS) tablet Take 1 tablet by mouth daily.    NARCAN 4 MG/0.1ML LIQD nasal spray kit Place 1 spray into the nose as needed (accidental overdose). 07/27/2019: Never used   nicotine polacrilex (NICORETTE) 4 MG gum Take 1 each (4 mg total) by mouth as needed for smoking cessation.    NON FORMULARY Uses a C-PAP at bedtime    nystatin (MYCOSTATIN) 100000 UNIT/ML suspension TAKE 5 MLS (500,000 UNITS TOTAL) BY MOUTH 4 (FOUR) TIMES DAILY. SWISH AND SWALLOW.    Omega-3 Fatty Acids (FISH OIL) 1000 MG CAPS Take 1,000 mg by mouth daily.     omeprazole (PRILOSEC) 20 MG capsule TAKE 1 CAPSULE BY MOUTH EVERY DAY    Semaglutide,0.25 or 0.5MG/DOS, (OZEMPIC, 0.25 OR 0.5 MG/DOSE,) 2 MG/1.5ML SOPN Inject 0.25 mg into the skin once a week. Minus 2 clicks    varenicline (CHANTIX CONTINUING MONTH PAK) 1 MG  tablet Take 1 tablet (1 mg total) by mouth 2 (two) times daily.    venlafaxine XR (EFFEXOR-XR) 75 MG 24 hr capsule TAKE 1 CAPSULE BY MOUTH DAILY WITH BREAKFAST.    Facility-Administered Encounter Medications as of 04/06/2020  Medication   technetium tetrofosmin (TC-MYOVIEW) injection 93.2 millicurie    Patient Active Problem List   Diagnosis Date Noted   Vaginal discharge 01/20/2020   Prolapse of anterior vaginal wall 11/10/2019   Skin ulcer of left thigh, limited to breakdown of skin ([place]) 09/09/2019   Ulnar nerve compression 08/06/2019   Diabetic neuropathy associated with type 2 diabetes mellitus ([place]) 08/06/2019   Body mass index 50.0-59.9, adult ([place]) 06/12/2019   Impingement syndrome of left shoulder 05/07/2019  Polyp of colon    Polyp of ascending colon    Rectal discomfort 02/12/2019   Osteoarthritis of right hip 02/12/2019   Nontraumatic tear of left supraspinatus tendon 12/30/2018   Gout 12/17/2018   Nontraumatic complete tear of right rotator cuff 12/08/2018   Rotator cuff tear, right 12/01/2018   Tobacco abuse 08/21/2018   Right groin pain 07/20/2018   Depressed mood 07/13/2018   Paroxysmal SVT (supraventricular tachycardia) ([place]) 03/20/2018   Pure hypercholesterolemia 03/20/2018   Muscle cramping 02/25/2018   Nodule of upper lobe of right lung 02/13/2018   Falls, subsequent encounter 01/30/2018   Pain in left foot 11/04/2017   Essential hypertension 10/27/2017   Solitary pulmonary nodule 10/07/2017   Restrictive lung disease secondary to obesity 10/01/2017   Polycythemia, secondary 10/01/2017   Chronic viral hepatitis B without delta-agent ([place]) 07/08/2017   COPD (chronic obstructive pulmonary disease) with chronic bronchitis ([place]) 06/27/2017   Chronic kidney disease (CKD), stage IV (severe) ([place]) 01/14/2017   Type 2 diabetes mellitus with stage 3 chronic kidney disease, with long-term current use of insulin ([place]) 12/12/2016    Urge incontinence of urine 12/05/2016   Trigger finger, left index finger 07/15/2016   Back pain 04/07/2014   Morbid obesity ([place]) 10/02/2012   Chronic diastolic CHF (congestive heart failure) ([place]) 04/07/2012   GERD 01/26/2010   Obstructive sleep apnea treated with BiPAP 02/03/2008   FIBROCYSTIC BREAST DISEASE 10/28/2006   Hyperlipidemia 03/27/2006   Obesity hypoventilation syndrome ([place]) 03/27/2006   Depression with anxiety 03/27/2006    Conditions to be addressed/monitored: Level of care concerns  Care Plan : Clinical Social Work  Updates made by [name], LCSW since 04/07/2020 12:00 AM  Problem: Mobility and Independence   Goal: Mobility and Independence Optimized   Start Date: 04/06/2020  Expected End Date: 05/27/2020  This Visit's Progress: On track  Priority: High  Current Barriers:    Patient's [name] hours have been reduced from 80 to 46.  States this is because she started over. Will need [name] change of status form to get increase in hours  Patient unable to consistently perform activities of daily living and needs assistance and support in order to meet this unmet need  Currently unable to independently self manage needs related to chronic health conditions.   Knowledge Deficits related to short term plan for care coordination needs and long term plans for chronic disease management needs Clinical Goals: Over the next 45 to 60 days, patient will have personal care needs met as evident by having [name] Aide in the home assisting with needs.  Clinical Interventions :  Assessed needs, level of care concerns, basic eligibility and provided education on Personal Care Service process,   Collaborate with primary care provider ref completing [name] referral ( Referral placed in PCP's mailbox 04/07/20 )  [name] referral will be faxed to [name] at [phone] once completed and signed by PCP  LCSW will collaborate with [facility] to verify  application is received and processed.   Other interventions provided: Emotional/Supportive Counseling Patient Goals/Self-Care Activities: Over the next 30 days  I will work with Dr. Caron Presume to get the completed and faxed to [facility]  Return calls from [facility] or call them directly if you have questions [phone] or [place], [place] / [place]   [phone] 8:54 AM

## 2020-04-07 NOTE — Patient Instructions (Signed)
  [name]  it was nice speaking with you. Please call me directly [phone] if you have questions about the goals we discussed. Goals Addressed            This Visit's Progress   . Personal Care Services        Patient Goals/Self-Care Activities:  . I will work with Dr. Caron Presume to get the completed and faxed to [facility] . Call [facility] to schedule assessment [phone] or [phone]       [name] received Care Coordination services today:  1. Care Coordination services include personalized support from designated clinical staff supervised by her physician, including individualized plan of care and coordination with other care providers 2. 24/7 contact [phone] for assistance for urgent and routine care needs. 3. Care Coordination are voluntary services and be declined at any time by calling the office.  Patient verbalizes understanding of instructions provided today.    Follow up plan: with patient in 7 to 10 days  [name], LCSW

## 2020-04-13 ENCOUNTER — Ambulatory Visit: Payer: Self-pay | Admitting: General Surgery

## 2020-04-14 ENCOUNTER — Encounter: Payer: Self-pay | Admitting: Family Medicine

## 2020-04-14 ENCOUNTER — Inpatient Hospital Stay: Admission: RE | Admit: 2020-04-14 | Payer: Medicaid Other | Source: Ambulatory Visit

## 2020-04-14 IMAGING — MR MR CERVICAL SPINE W/O CM
4 of 6 series · 25 of 48 positions shown · non-contrast
Comparison: 10/21/2008 MRI.  Cervical spine CT 04/21/2009

CLINICAL DATA: Right C7 radiculopathy

EXAM:
MRI CERVICAL SPINE WITHOUT CONTRAST
TECHNIQUE: Multiplanar, multisequence MR imaging of the cervical spine was
performed. No intravenous contrast was administered.

[Series 3: T2 post-contrast · sagittal · 3.3mm · 0.37mm/px · 5 of 13 slices shown]
[im 1/13]
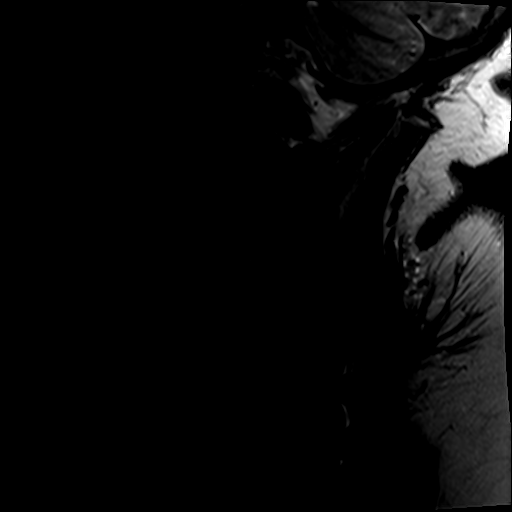
[im 4/13]
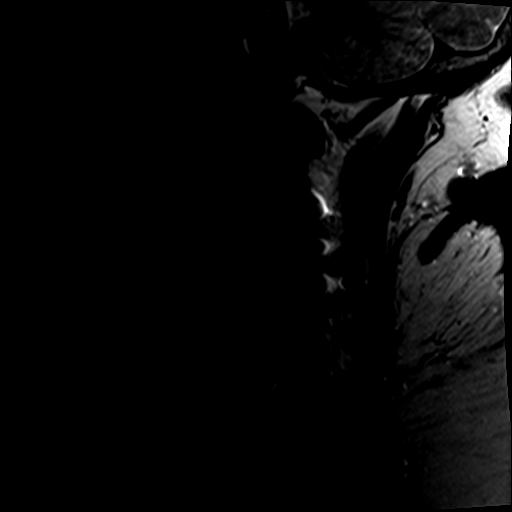
[im 7/13]
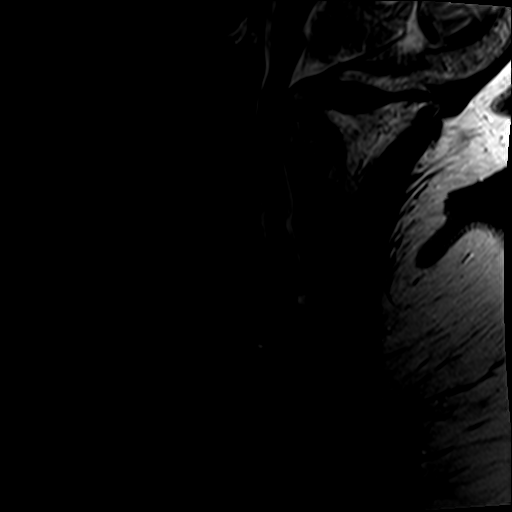
[im 10/13]
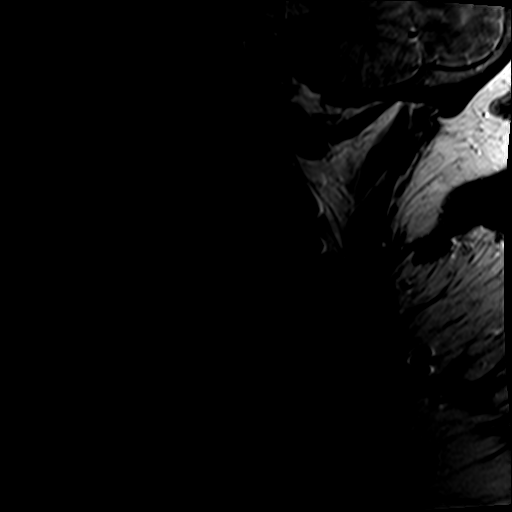
[im 13/13]
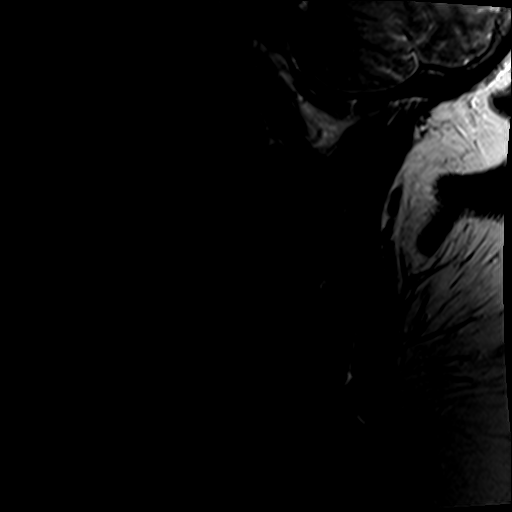

[Series 4: T1 · sagittal · 3.3mm · 0.37mm/px · 5 of 13 slices shown (1 of 2)]
[im 1/13]
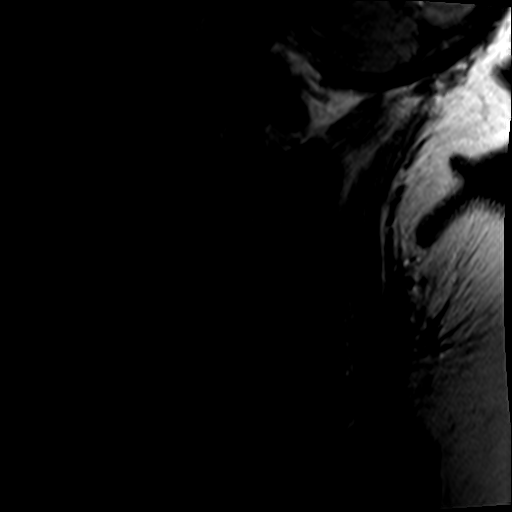
[im 4/13]
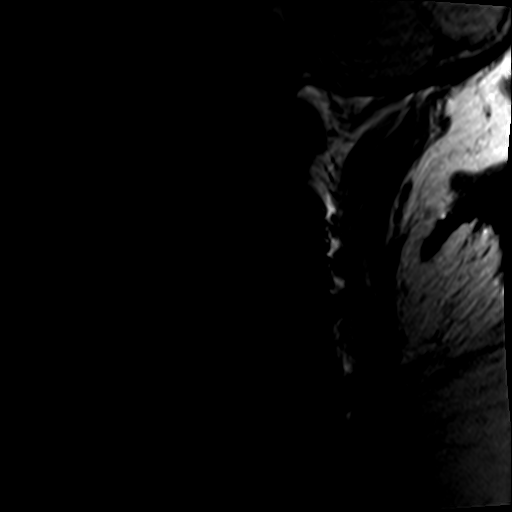
[im 7/13]
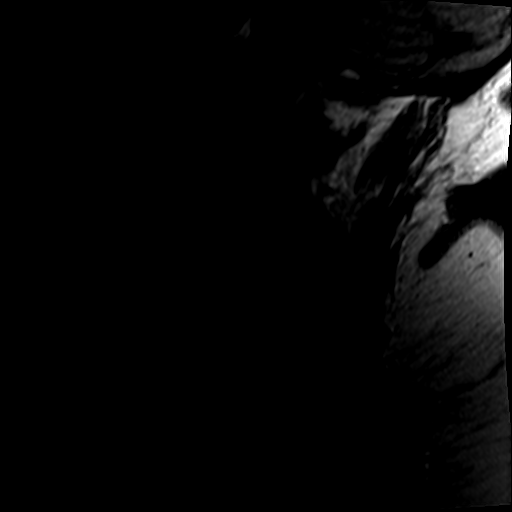
[im 10/13]
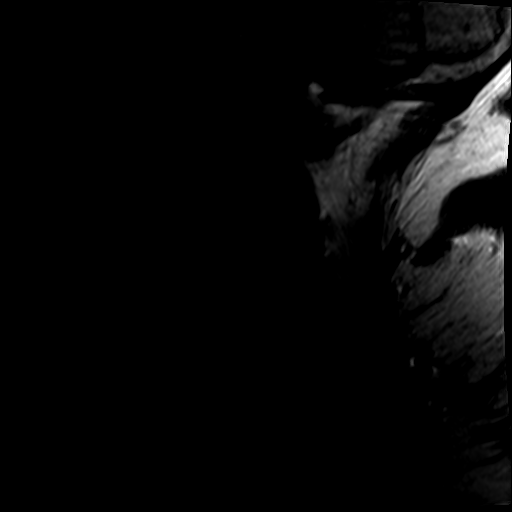
[im 13/13]
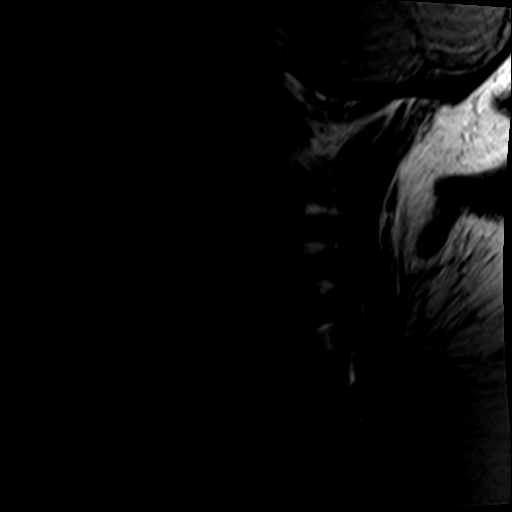

[Series 6: T2 · axial · 3.0mm · 0.70mm/px · z∈[-56,+56]mm · 9 of 30 slices shown]
[im 1/30]
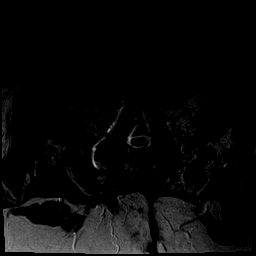
[im 5/30]
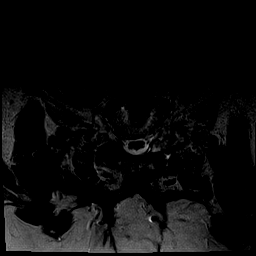
[im 10/30]
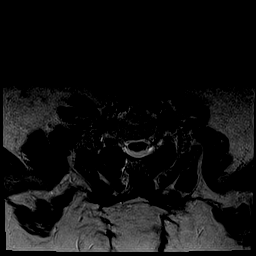
[im 13/30]
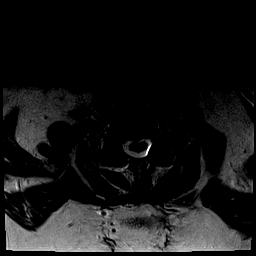
[im 15/30]
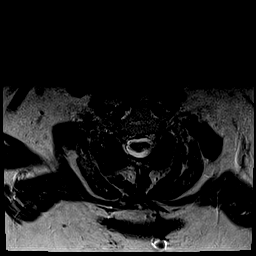
[im 17/30]
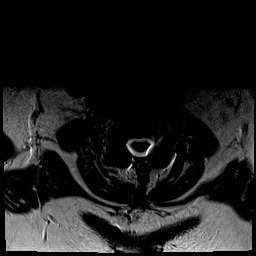
[im 20/30]
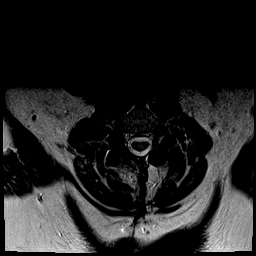
[im 25/30]
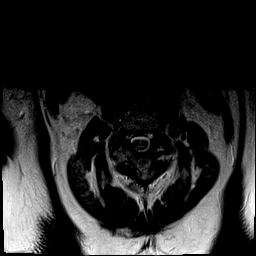
[im 30/30]
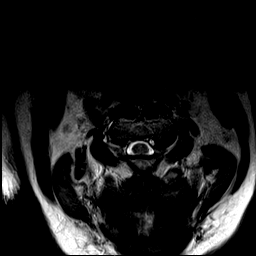

[Series 8: T1 · sagittal · 3.3mm · 0.37mm/px · 6 of 13 slices shown (2 of 2)]
[im 1/13]
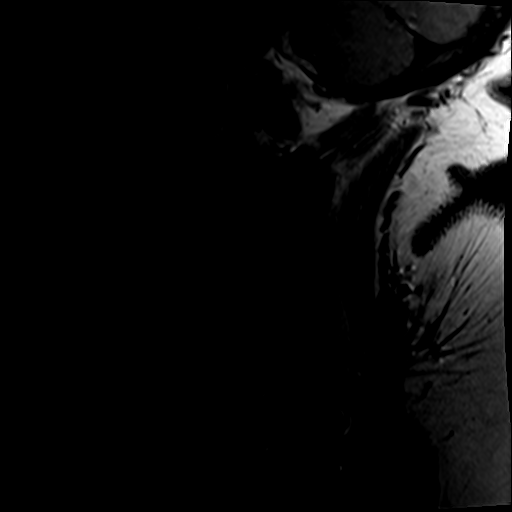
[im 3/13]
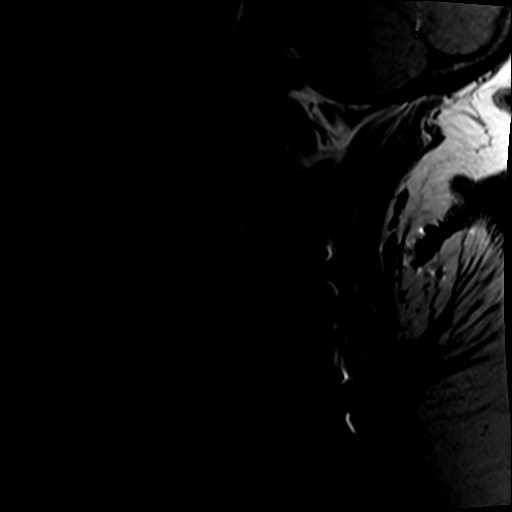
[im 5/13]
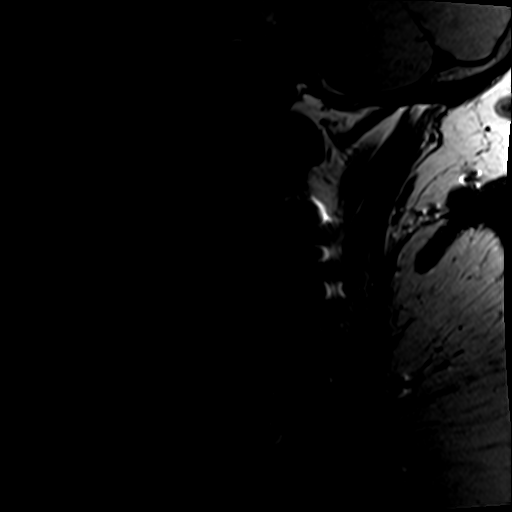
[im 8/13]
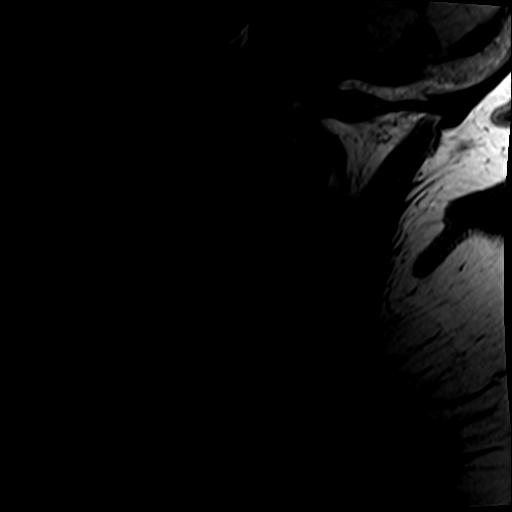
[im 10/13]
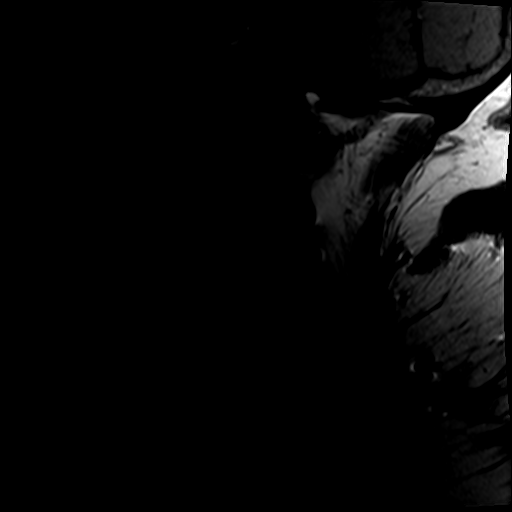
[im 13/13]
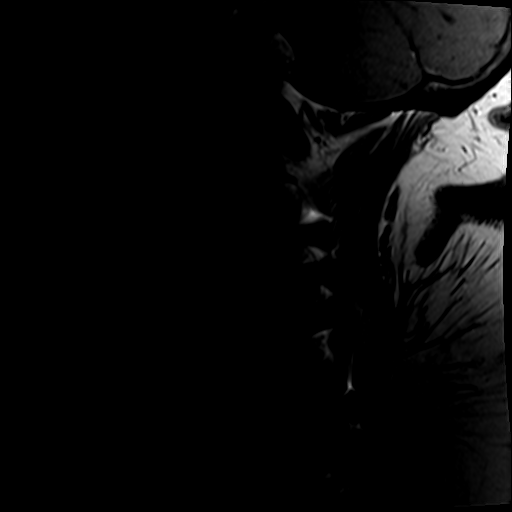

[25 of 48 positions shown; findings below may reference images not displayed]

FINDINGS: Alignment: Fused reversal of cervical lordosis.

Vertebrae: There is decompressive laminectomy from C3 to the C7-T1.
Posterior fusion hardware at C4 to T2. ACDF hardware at C7-T1. As
permitted by artifact there is likely solid arthrodesis throughout
the posterior construct. Definite solid arthrodesis at C7-T1. No
evidence of fracture, discitis, or aggressive bone lesion

Cord: Normal signal and morphology

Posterior Fossa, vertebral arteries, paraspinal tissues: Expected
postoperative scarring dorsally. No significant incidental finding

Disc levels:

C2-3: Facet spurring and mild disc narrowing. The canal and foramina
are patent

C3-4: Laminectomy with widely patent canal. Degree of residual right
foraminal stenosis from uncovertebral ridging, stable from 6755.

C4-5: Laminectomy with widely patent canal. Patent bilateral
foramina

C5-6: Laminectomy with widely patent canal. Patent bilateral
foramina

C6-7: Laminectomy with patent canal. Patent bilateral foramina, with
apparent narrowing of the right foramen on axial slices attributed
to slice selection based on the sagittal T2 weighted imaging.

C7-T1:Laminectomy with patent canal. Degree of residual bilateral
foraminal narrowing from disc height loss and uncovertebral
spurring, but patent appearing bilaterally on sagittal images. The
foraminal appearance appearance correlates with 6755 cervical spine
CT.

T2-3 central disc protrusion only seen on sagittal images, without
cord compression.
IMPRESSION: 1. Solid-appearing arthrodesis from C3-T2. Diffusely patent spinal
canal after C3-C7 laminectomy.
2. History of C7 right radiculopathy. The right C6-7 foramen appears
sufficiently patent.
3. Intermittent motion degradation.

## 2020-04-14 MED ORDER — DICLOFENAC SODIUM 1 % EX GEL: CUTANEOUS | 1 refills | Status: DC

## 2020-04-17 ENCOUNTER — Encounter: Payer: 59 | Attending: General Surgery | Admitting: Skilled Nursing Facility1

## 2020-04-17 ENCOUNTER — Other Ambulatory Visit: Payer: Self-pay

## 2020-04-17 DIAGNOSIS — E669 Obesity, unspecified: Secondary | ICD-10-CM | POA: Insufficient documentation

## 2020-04-17 DIAGNOSIS — N184 Chronic kidney disease, stage 4 (severe): Secondary | ICD-10-CM | POA: Diagnosis not present

## 2020-04-17 NOTE — Progress Notes (Signed)
Pre-Operative Nutrition Class:  Appt start time: 1580   End time:  1830.  Patient was seen on 04/17/2020 for Pre-Operative Bariatric Surgery Education [facility]    Surgery date: 05/08/2020 Surgery type: sleeve Start weight at [facility]: 405.6 Weight today: 357.3  The following the learning objectives were met by the patient during this course:  Identify Pre-Op Dietary Goals and will begin 2 weeks pre-operatively  Identify appropriate sources of fluids and proteins   State protein recommendations and appropriate sources pre and post-operatively  Identify Post-Operative Dietary Goals and will follow for 2 weeks post-operatively  Identify appropriate multivitamin and calcium sources  Describe the need for physical activity post-operatively and will follow MD recommendations  State when to call healthcare provider regarding medication questions or post-operative complications  Handouts given during class include:  Pre-Op Bariatric Surgery Diet Handout  Protein Shake Handout  Post-Op Bariatric Surgery Nutrition Handout  BELT Program Information Flyer  Support Group Information Flyer  [facility] Bariatric Supplements Price List  Follow-Up Plan: Patient will follow-up at [facility] 2 weeks post operatively for diet advancement per MD.

## 2020-04-19 ENCOUNTER — Ambulatory Visit: Payer: Medicare Other | Admitting: Podiatry

## 2020-04-20 ENCOUNTER — Ambulatory Visit: Payer: 59 | Admitting: Licensed Clinical Social Worker

## 2020-04-20 DIAGNOSIS — Z741 Need for assistance with personal care: Secondary | ICD-10-CM

## 2020-04-20 NOTE — Patient Instructions (Signed)
  Ms. Hoots  it was nice speaking with you. Please call me directly if you have questions about the goals we discussed. Goals Addressed            This Visit's Progress   . Personal Care Services   On track     Patient Goals/Self-Care Activities:  . Contact office when you are ready for Dr. Caron Presume to complete the [name] form . Call [facility] to schedule assessment [phone] or [phone]       Ms. Rosenbloom received Care Coordination services today:  1. Care Coordination services include personalized support from designated clinical staff supervised by her physician, including individualized plan of care and coordination with other care providers 2. 24/7 contact [phone] for assistance for urgent and routine care needs. 3. Care Coordination are voluntary services and be declined at any time by calling the office. Patient verbalizes understanding of instructions provided today.    Follow up plan: Client will call office as needed.  LCSW will f/u in 30 days  [name], [place]

## 2020-04-20 NOTE — Chronic Care Management (AMB) (Signed)
Care Management Clinical Social Work Note  07/04/3014 Name: [name] MRN: [id number] DOB: 7/32/2027  [name] is a 55 y.o. year old female who is a primary care patient of [name], MD.    Engaged with patient by telephone for follow up visit in response to provider referral for social work chronic care management and care coordination services for personal care services.  Consent to Services: Patient agreed to services and consent obtained.   Assessment: Patient is making progress with making progress with her aide and the current hours she has. She has decided to wait until [place] care start in person assessment before asking the provider to send in the change of status form. (See Care Plan below for interventions and patient self-care actives).  Follow up Plan:Patietn states she will call office when she is ready to move forward with [name] change in status.  LCSW will f/u in 30 days if not call is received.      Review of patient past medical history, allergies, medications, and health status, including review of relevant consultants reports was performed today as part of a comprehensive evaluation and provision of chronic care management and care coordination services.  SDOH (Social Determinants of Health) assessments and interventions performed: no needs identified    Advanced Directives Status: See [employer]yn[employer] application for related entries.  Care Plan  Allergies  Allergen Reactions  . Aspirin Other (See Comments)    Kidney disease  . 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 Corticosteroids Other (See Comments)    Hyperglycemia   . Levofloxacin Itching    Outpatient Encounter Medications as of 04/20/2020  Medication Sig Note  . Accu-Chek Softclix Lancets lancets TEST 4 TIMES DAILY   . acetaminophen (TYLENOL) 500 MG tablet Take 1,000 mg by mouth 2 (two) times daily.   [name] Kitchen albuterol (VENTOLIN HFA) 108 (90 Base) MCG/ACT inhaler  Inhale 2 puffs into the lungs every 6 (six) hours as needed for wheezing or shortness of breath.   . allopurinol (ZYLOPRIM) 300 MG tablet Take 300 mg by mouth in the morning.   [name] Kitchen ammonium lactate (LAC-HYDRIN) 12 % lotion APPLY TWICE A DAY AS NEEDED FOR DRY SKIN (Patient taking differently: Apply 1 application topically in the morning and at bedtime.)   . atorvastatin (LIPITOR) 40 MG tablet TAKE 1 TABLET BY MOUTH EVERY DAY (Patient taking differently: Take 40 mg by mouth in the morning.)   . b complex vitamins capsule Take 1 capsule by mouth daily.   . B-D UF III MINI PEN NEEDLES 31G X 5 MM MISC CHECK SUGARS 4 TIMES A DAY BEFORE MEALS AND AT BEDTIME.   . baclofen (LIORESAL) 10 MG tablet Take 1 tablet (10 mg total) by mouth 3 (three) times daily as needed for muscle spasms. (Patient taking differently: Take 10 mg by mouth 3 (three) times daily.)   . blood glucose meter kit and supplies KIT Dispense based on patient and insurance preference. Use up to four times daily as directed. (FOR ICD-9 250.00, 250.01).   . busPIRone (BUSPAR) 10 MG tablet Take 1 tablet (10 mg total) by mouth 3 (three) times daily.   . diclofenac Sodium (VOLTAREN) 1 % GEL APPLY 2 GRAMS TO AFFECTED AREA 4 TIMES A DAY   . diltiazem (CARDIZEM CD) 120 MG 24 hr capsule TAKE 1 CAPSULE (120 MG TOTAL) BY MOUTH 2 (TWO) TIMES DAILY.   [name] Kitchen docusate sodium (COLACE) 100 MG capsule Take  200 mg by mouth 2 (two) times daily.   . empagliflozin (JARDIANCE) 25 MG TABS tablet Take 1 tablet (25 mg total) by mouth daily. (Patient taking differently: Take 25 mg by mouth in the morning.)   . febuxostat (ULORIC) 40 MG tablet Take 40 mg by mouth daily.   . fluticasone (FLONASE) 50 MCG/ACT nasal spray Place 2 sprays into both nostrils 2 (two) times daily.   . Fluticasone-Umeclidin-Vilant (TRELEGY ELLIPTA) 100-62.5-25 MCG/INH AEPB Inhale 1 puff into the lungs daily.   . furosemide (LASIX) 40 MG tablet TAKE 2 TABLETS (80 MG TOTAL) BY MOUTH 2 (TWO) TIMES DAILY.  (Patient taking differently: Take 80 mg by mouth 2 (two) times daily. Morning & lunch)   . gabapentin (NEURONTIN) 300 MG capsule Take 2 capsules (600 mg total) by mouth 3 (three) times daily. TAKE 2 CAPSULES (600 MG TOTAL) BY MOUTH in the morning and at lunch and take 3 capsules at night (Patient taking differently: Take 600 mg by mouth 3 (three) times daily.)   . glucose blood (ACCU-CHEK AVIVA PLUS) test strip 1 each by Other route 3 (three) times daily. TEST 3 TIMES DAILY   . HYDROcodone-acetaminophen (NORCO) 7.5-325 MG tablet Take 1 tablet by mouth every 6 (six) hours as needed for moderate pain.   . hydrOXYzine (ATARAX/VISTARIL) 10 MG tablet Take 1 tablet (10 mg total) by mouth daily.   . insulin glargine (LANTUS SOLOSTAR) 100 UNIT/ML Solostar Pen Inject 50 Units into the skin daily.   . insulin lispro (HUMALOG) 100 UNIT/ML KwikPen Inject 18 Units into the skin 3 (three) times daily before meals.   [name] Kitchen ketoconazole (NIZORAL) 2 % cream APPLY 1 FINGERTIP AMOUNT TO EACH FOOT DAILY. (Patient taking differently: Apply 1 application topically in the morning, at noon, and at bedtime.)   . losartan (COZAAR) 100 MG tablet Take 1 tablet (100 mg total) by mouth daily.   . metolazone (ZAROXOLYN) 2.5 MG tablet Take 1 tablet (2.5 mg total) by mouth 2 (two) times a week. TAKE 1 TABLET TWICE WEEKLY ON MONDAYS AND THURSDAYS ONLY   . montelukast (SINGULAIR) 10 MG tablet TAKE 1 TABLET BY MOUTH EVERYDAY AT BEDTIME (Patient taking differently: Take 10 mg by mouth at bedtime.)   . Multiple Vitamins-Minerals (MULTIVITAMIN WITH MINERALS) tablet Take 1 tablet by mouth daily.   [name] Kitchen NARCAN 4 MG/0.1ML LIQD nasal spray kit Place 1 spray into the nose as needed (accidental overdose). 07/27/2019: Never used  . NON FORMULARY Uses a C-PAP at bedtime   . nystatin (MYCOSTATIN) 100000 UNIT/ML suspension TAKE 5 MLS (500,000 UNITS TOTAL) BY MOUTH 4 (FOUR) TIMES DAILY. SWISH AND SWALLOW. (Patient taking differently: Take 5 mLs by mouth 4  (four) times daily as needed (thrush). Swish and swallow.)   . Omega-3 Fatty Acids (FISH OIL) 1000 MG CAPS Take 1,000 mg by mouth in the morning.   [name] Kitchen omeprazole (PRILOSEC) 20 MG capsule TAKE 1 CAPSULE BY MOUTH EVERY DAY   . Potassium Chloride ER 20 MEQ TBCR Take 20 mEq by mouth daily.   . Semaglutide,0.25 or 0.5MG/DOS, (OZEMPIC, 0.25 OR 0.5 MG/DOSE,) 2 MG/1.5ML SOPN Inject 0.25 mg into the skin once a week. Minus 2 clicks (Patient taking differently: Inject 0.25 mg into the skin every Thursday. Minus 2 clicks)   . varenicline (CHANTIX CONTINUING MONTH PAK) 1 MG tablet Take 1 tablet (1 mg total) by mouth 2 (two) times daily.   [name] Kitchen venlafaxine XR (EFFEXOR-XR) 75 MG 24 hr capsule TAKE 1 CAPSULE BY MOUTH DAILY WITH BREAKFAST. (  Patient taking differently: Take 75 mg by mouth daily with breakfast.)    Facility-Administered Encounter Medications as of 04/20/2020  Medication  . technetium tetrofosmin (TC-MYOVIEW) injection 65.6 millicurie    Patient Active Problem List   Diagnosis Date Noted  . Vaginal discharge 01/20/2020  . Prolapse of anterior vaginal wall 11/10/2019  . Skin ulcer of left thigh, limited to breakdown of skin ([place]) 09/09/2019  . Ulnar nerve compression 08/06/2019  . Diabetic neuropathy associated with type 2 diabetes mellitus ([place]) 08/06/2019  . Body mass index 50.0-59.9, adult ([place]) 06/12/2019  . Impingement syndrome of left shoulder 05/07/2019  . Polyp of colon   . Polyp of ascending colon   . Rectal discomfort 02/12/2019  . Osteoarthritis of right hip 02/12/2019  . Nontraumatic tear of left supraspinatus tendon 12/30/2018  . Gout 12/17/2018  . Nontraumatic complete tear of right rotator cuff 12/08/2018  . Rotator cuff tear, right 12/01/2018  . Tobacco abuse 08/21/2018  . Right groin pain 07/20/2018  . Depressed mood 07/13/2018  . Paroxysmal SVT (supraventricular tachycardia) ([name]) 03/20/2018  . Pure hypercholesterolemia 03/20/2018  . Muscle cramping 02/25/2018  . Nodule  of upper lobe of right lung 02/13/2018  . Falls, subsequent encounter 01/30/2018  . Pain in left foot 11/04/2017  . Essential hypertension 10/27/2017  . Solitary pulmonary nodule 10/07/2017  . Restrictive lung disease secondary to obesity 10/01/2017  . Polycythemia, secondary 10/01/2017  . Chronic viral hepatitis B without delta-agent ([place]) 07/08/2017  . COPD (chronic obstructive pulmonary disease) with chronic bronchitis ([place]) 06/27/2017  . Chronic kidney disease (CKD), stage IV (severe) ([place]) 01/14/2017  . Type 2 diabetes mellitus with stage 3 chronic kidney disease, with long-term current use of insulin ([place]) 12/12/2016  . Urge incontinence of urine 12/05/2016  . Trigger finger, left index finger 07/15/2016  . Back pain 04/07/2014  . Morbid obesity ([place]) 10/02/2012  . Chronic diastolic CHF (congestive heart failure) ([place]) 04/07/2012  . GERD 01/26/2010  . Obstructive sleep apnea treated with BiPAP 02/03/2008  . FIBROCYSTIC BREAST DISEASE 10/28/2006  . Hyperlipidemia 03/27/2006  . Obesity hypoventilation syndrome ([place]) 03/27/2006  . Depression with anxiety 03/27/2006    Conditions to be addressed/monitored: Level of care concerns  Care Plan : Clinical Social Work  Updates made by [name], LCSW since 04/20/2020 12:00 AM  Problem: Mobility and Independence   Goal: Mobility and Independence Optimized   Start Date: 04/06/2020  Expected End Date: 05/27/2020  Recent Progress: On track  Priority: High  Current Barriers:    Patient would like to wait for change in condition form to be completed by PCP   Patient's [name] hours have been reduced from 80 to 46.  States this is because she started over. Will need [name] change of status form to get increase in hours  Patient unable to consistently perform activities of daily living and needs assistance and support in order to meet this unmet need  Currently unable to independently self manage needs related to chronic health conditions.    Knowledge Deficits related to short term plan for care coordination needs and long term plans for chronic disease management needs Clinical Goals: Over the next 45 to 60 days, patient will have personal care needs met as evident by having [name] Aide in the home assisting with needs.  Clinical Interventions : . Assessed needs, level of care concerns reference Personal Care Service process,  . Collaborate with primary care provider ref completing [name] referral and patient's request to wait on sending  form to [place] . Patient applies for CAPS program but states she was denied . Other interventions provided: Emotional Support and reflective listing  Patient Goals/Self-Care Activities: Over the next 30 days . Contact office when you are ready for Dr. Caron Presume to complete the [name] form . Return calls from [facility] or call them directly if you have questions [phone] or [place], [place] / [place]   [phone] 1:53 PM

## 2020-04-21 ENCOUNTER — Ambulatory Visit (INDEPENDENT_AMBULATORY_CARE_PROVIDER_SITE_OTHER): Payer: 59 | Admitting: Physical Medicine and Rehabilitation

## 2020-04-21 ENCOUNTER — Encounter: Payer: Self-pay | Admitting: Family Medicine

## 2020-04-21 ENCOUNTER — Encounter: Payer: Self-pay | Admitting: Physical Medicine and Rehabilitation

## 2020-04-21 ENCOUNTER — Other Ambulatory Visit: Payer: Self-pay

## 2020-04-21 ENCOUNTER — Telehealth: Payer: Self-pay | Admitting: Pharmacist

## 2020-04-21 DIAGNOSIS — R202 Paresthesia of skin: Secondary | ICD-10-CM | POA: Diagnosis not present

## 2020-04-21 DIAGNOSIS — Z720 Tobacco use: Secondary | ICD-10-CM

## 2020-04-21 NOTE — Assessment & Plan Note (Signed)
Contacted patient RE tobacco cessation.   Patient has quit multiple times in the past.  Currently QUIT for 3 days after returning to smoking several weeks ago.    Patient is scheduled to have "Gastric Sleeve" surgery in early April.  She has appointment to verify quit status with a "cotinine level" on 05/04/2020.  She realizes that they will not do the surgery if she is smoking.   Encouraged continued abstinence.  I agree that no drug therapy indicated at this time as patient appears highly motivated and has minimal craving.   Plan to call her 1-2 weeks after procedure to evaluate tobacco cessation status.

## 2020-04-21 NOTE — Telephone Encounter (Signed)
Contacted patient RE tobacco cessation.   Patient has quit multiple times in the past.  Currently QUIT for 3 days after returning to smoking several weeks ago.    Patient is scheduled to have "Gastric Sleeve" surgery in early April.  She has appointment to verify quit status with a "cotinine level" on 05/04/2020.  She realizes that they will not do the surgery if she is smoking.   Encouraged continued abstinence.  I agree that no drug therapy indicated at this time as patient appears highly motivated and has minimal craving.   Plan to call her 1-2 weeks after procedure to evaluate tobacco cessation status.

## 2020-04-21 NOTE — Progress Notes (Signed)
Swelling in left forearm for the last few days. Numbness in left fourth and fifth fingers. Pain in left upper arm, shoulder, left side of neck.  Left hand dominant No lotion per patient Numeric Pain Rating Scale and Functional Assessment Average Pain 10   In the last MONTH (on 0-10 scale) has pain interfered with the following?  1. General activity like being  able to carry out your everyday physical activities such as walking, climbing stairs, carrying groceries, or moving a chair?  Rating(10)

## 2020-04-21 NOTE — Telephone Encounter (Signed)
Noted and agree. 

## 2020-04-24 ENCOUNTER — Ambulatory Visit: Payer: Medicaid Other | Admitting: Specialist

## 2020-04-24 NOTE — Procedures (Signed)
EMG & NCV Findings: Evaluation of the left ulnar motor nerve showed decreased conduction velocity (A Elbow-B Elbow, 29 m/s).  The left median (across palm) sensory nerve showed prolonged distal peak latency (3.8 ms).  The left ulnar sensory nerve showed reduced amplitude (14.6 V).  All remaining nerves (as indicated in the following tables) were within normal limits.    Needle evaluation of the left first dorsal interosseous muscle showed increased insertional activity, moderately increased spontaneous activity, and diminished recruitment.  All remaining muscles (as indicated in the following table) showed no evidence of electrical instability.    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    There is no significant electrodiagnostic evidence of any other focal nerve entrapment, brachial plexopathy or cervical radiculopathy. **The borderline slowing of the median nerve at the wrist along with being asymptomatic is likely temperature artifact.  Recommendations: 1.  Follow-up with referring physician. 2.  Continue current management of symptoms. 3.  Suggest surgical evaluation.  ___________________________ Laurence Spates FAAPMR Board Certified, American Board of Physical Medicine and Rehabilitation    Nerve Conduction Studies Anti Sensory Summary Table   Stim Site NR Peak (ms) Norm Peak (ms) P-T Amp (V) Norm P-T Amp Site1 Site2 Delta-P (ms) Dist (cm) Vel (m/s) Norm Vel (m/s)  Left Median Acr Palm Anti Sensory (2nd Digit)  30.5C  Wrist    *3.8 <3.6 11.6 >10        Site 3    1.8  0.2         Left Radial Anti Sensory (Base 1st Digit)  30.6C  Wrist    2.2 <3.1 35.4  Wrist Base 1st Digit 2.2 0.0    Left Ulnar Anti Sensory (5th Digit)  30.9C  Wrist    3.4 <3.7 *14.6 >15.0 Wrist 5th Digit 3.4 14.0 41 >38   Motor Summary Table   Stim Site NR  Onset (ms) Norm Onset (ms) O-P Amp (mV) Norm O-P Amp Site1 Site2 Delta-0 (ms) Dist (cm) Vel (m/s) Norm Vel (m/s)  Left Median Motor (Abd Poll Brev)  30.5C  Wrist    3.8 <4.2 10.5 >5 Elbow Wrist 4.4 23.0 52 >50  Elbow    8.2  7.2         Left Ulnar Motor (Abd Dig Min)  30.2C  Wrist    3.4 <4.2 7.6 >3 B Elbow Wrist 3.2 22.0 69 >53  B Elbow    6.6  7.4  A Elbow B Elbow 3.4 10.0 *29 >53  A Elbow    10.0  7.1          EMG   Side Muscle Nerve Root Ins Act Fibs Psw Amp Dur Poly Recrt Int Fraser Din Comment  Left Abd Poll Brev Median C8-T1 Nml Nml Nml Nml Nml 0 Nml Nml   Left 1stDorInt Ulnar C8-T1 *CRD *1+ *2+ Nml Nml 0 *Reduced Nml   Left PronatorTeres Median C6-7 Nml Nml Nml Nml Nml 0 Nml Nml     Nerve Conduction Studies Anti Sensory Left/Right Comparison   Stim Site L Lat (ms) R Lat (ms) L-R Lat (ms) L Amp (V) R Amp (V) L-R Amp (%) Site1 Site2 L Vel (m/s) R Vel (m/s) L-R Vel (m/s)  Median Acr Palm Anti Sensory (2nd Digit)  30.5C  Wrist *3.8   11.6         Site 3 1.8   0.2  Radial Anti Sensory (Base 1st Digit)  30.6C  Wrist 2.2   35.4   Wrist Base 1st Digit     Ulnar Anti Sensory (5th Digit)  30.9C  Wrist 3.4   *14.6   Wrist 5th Digit 41     Motor Left/Right Comparison   Stim Site L Lat (ms) R Lat (ms) L-R Lat (ms) L Amp (mV) R Amp (mV) L-R Amp (%) Site1 Site2 L Vel (m/s) R Vel (m/s) L-R Vel (m/s)  Median Motor (Abd Poll Brev)  30.5C  Wrist 3.8   10.5   Elbow Wrist 52    Elbow 8.2   7.2         Ulnar Motor (Abd Dig Min)  30.2C  Wrist 3.4   7.6   B Elbow Wrist 69    B Elbow 6.6   7.4   A Elbow B Elbow *29    A Elbow 10.0   7.1            Waveforms:

## 2020-04-24 NOTE — Progress Notes (Signed)
[name] - 55 y.o. female MRN [id number]  Date of birth: [birth year]  Office Visit Note: Visit Date: 04/21/2020 PCP: [name], MD Referred by: [name], MD  Subjective: Chief Complaint  Patient presents with  . Left Hand - Numbness  . Neck - Pain  . Left Shoulder - Pain   HPI:  [name] is a 55 y.o. female who comes in today at the request of Dr. Eduard Roux for electrodiagnostic study of the Left upper extremities.  Patient is Left hand dominant.  She is also followed by Dr. [name].  Patient has had prior left carpal tunnel release.  She is having symptoms more of an ulnar nerve distribution with numbness in the left fourth and fifth fingers.  She does endorse left-sided neck pain.  She rates her pain as a 10 out of 10.  She has not had recent electrodiagnostic study.  She endorses some focal and global weakness with some swelling in the left forearm.   ROS Otherwise per HPI.  Assessment & Plan: Visit Diagnoses:    ICD-10-CM   1. 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    There is no significant electrodiagnostic evidence of any other focal nerve entrapment, brachial plexopathy or cervical radiculopathy. **The borderline slowing of the median nerve at the wrist along with being asymptomatic is likely temperature artifact.  Recommendations: 1.  Follow-up with referring physician. 2.  Continue current management of symptoms. 3.  Suggest surgical evaluation.  Meds & Orders: No orders of the defined types were placed in this encounter.   Orders Placed This Encounter  Procedures  . NCV with EMG (electromyography)    Follow-up: Return in about 2 weeks (around 05/05/2020) for Eduard Roux, MD.   Procedures: No procedures performed  EMG  & NCV Findings: Evaluation of the left ulnar motor nerve showed decreased conduction velocity (A Elbow-B Elbow, 29 m/s).  The left median (across palm) sensory nerve showed prolonged distal peak latency (3.8 ms).  The left ulnar sensory nerve showed reduced amplitude (14.6 V).  All remaining nerves (as indicated in the following tables) were within normal limits.    Needle evaluation of the left first dorsal interosseous muscle showed increased insertional activity, moderately increased spontaneous activity, and diminished recruitment.  All remaining muscles (as indicated in the following table) showed no evidence of electrical instability.    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    There is no significant electrodiagnostic evidence of any other focal nerve entrapment, brachial plexopathy or cervical radiculopathy. **The borderline slowing of the median nerve at the wrist along with being asymptomatic is likely temperature artifact.  Recommendations: 1.  Follow-up with referring physician. 2.  Continue current management of symptoms. 3.  Suggest surgical evaluation.  ___________________________ Laurence Spates FAAPMR Board Certified, American Board of Physical Medicine and Rehabilitation    Nerve Conduction Studies Anti Sensory Summary Table   Stim Site NR Peak (ms) Norm Peak (ms) P-T Amp (V) Norm P-T Amp Site1 Site2 Delta-P (ms) Dist (cm) Vel (m/s) Norm Vel (m/s)  Left Median Acr Palm Anti Sensory (2nd Digit)  30.5C  Wrist    *3.8 <3.6 11.6 >10  Site 3    1.8  0.2         Left Radial Anti Sensory (Base 1st Digit)  30.6C  Wrist    2.2 <3.1 35.4  Wrist Base 1st Digit 2.2 0.0    Left Ulnar Anti Sensory (5th Digit)  30.9C  Wrist    3.4 <3.7 *14.6 >15.0 Wrist 5th Digit 3.4 14.0 41 >38   Motor Summary Table   Stim Site NR Onset  (ms) Norm Onset (ms) O-P Amp (mV) Norm O-P Amp Site1 Site2 Delta-0 (ms) Dist (cm) Vel (m/s) Norm Vel (m/s)  Left Median Motor (Abd Poll Brev)  30.5C  Wrist    3.8 <4.2 10.5 >5 Elbow Wrist 4.4 23.0 52 >50  Elbow    8.2  7.2         Left Ulnar Motor (Abd Dig Min)  30.2C  Wrist    3.4 <4.2 7.6 >3 B Elbow Wrist 3.2 22.0 69 >53  B Elbow    6.6  7.4  A Elbow B Elbow 3.4 10.0 *29 >53  A Elbow    10.0  7.1          EMG   Side Muscle Nerve Root Ins Act Fibs Psw Amp Dur Poly Recrt Int Fraser Din Comment  Left Abd Poll Brev Median C8-T1 Nml Nml Nml Nml Nml 0 Nml Nml   Left 1stDorInt Ulnar C8-T1 *CRD *1+ *2+ Nml Nml 0 *Reduced Nml   Left PronatorTeres Median C6-7 Nml Nml Nml Nml Nml 0 Nml Nml     Nerve Conduction Studies Anti Sensory Left/Right Comparison   Stim Site L Lat (ms) R Lat (ms) L-R Lat (ms) L Amp (V) R Amp (V) L-R Amp (%) Site1 Site2 L Vel (m/s) R Vel (m/s) L-R Vel (m/s)  Median Acr Palm Anti Sensory (2nd Digit)  30.5C  Wrist *3.8   11.6         Site 3 1.8   0.2         Radial Anti Sensory (Base 1st Digit)  30.6C  Wrist 2.2   35.4   Wrist Base 1st Digit     Ulnar Anti Sensory (5th Digit)  30.9C  Wrist 3.4   *14.6   Wrist 5th Digit 41     Motor Left/Right Comparison   Stim Site L Lat (ms) R Lat (ms) L-R Lat (ms) L Amp (mV) R Amp (mV) L-R Amp (%) Site1 Site2 L Vel (m/s) R Vel (m/s) L-R Vel (m/s)  Median Motor (Abd Poll Brev)  30.5C  Wrist 3.8   10.5   Elbow Wrist 52    Elbow 8.2   7.2         Ulnar Motor (Abd Dig Min)  30.2C  Wrist 3.4   7.6   B Elbow Wrist 69    B Elbow 6.6   7.4   A Elbow B Elbow *29    A Elbow 10.0   7.1            Waveforms:             Clinical History: MRI CERVICAL SPINE WITHOUT CONTRAST  TECHNIQUE: Multiplanar, multisequence MR imaging of the cervical spine was performed. No intravenous contrast was administered.  COMPARISON:  September 04, 2017 cervical spine MRI  FINDINGS: Alignment: Straightening of normal cervical lordosis.  No spondylolisthesis.  Vertebrae: Posterior spinal fusion hardware extends from C4-T2 with ACDF hardware at C7-T1  Cord: Normal signal and morphology.  Posterior Fossa, vertebral arteries, paraspinal tissues:  Negative.  Disc levels:  C1-C2: Normal.  C2-C3: Normal disc space and facets. No spinal canal or neuroforaminal stenosis.  C3-C4: Posterior fusion. No spinal canal or neural foraminal stenosis.  C4-C5: Posterior fusion. No spinal canal or neural foraminal stenosis.  C5-C6: Posterior fusion. Small central disc protrusion is unchanged. No spinal canal or neural foraminal stenosis.  C6-C7: Laminectomy without spinal canal stenosis.  C7-T1: Laminectomy without spinal canal stenosis.  IMPRESSION: Unchanged postoperative appearance of the cervical spine with posterior fusion from C4-T2 and ACDF at C7-T1. No spinal canal stenosis or neural impingement.   Electronically Signed   By: Ulyses Jarred M.D.   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   There is also evidence of a borderline right median nerve entrapment at the wrist (carpal tunnel syndrome) affecting sensory components.   There is no significant electrodiagnostic evidence of any other focal nerve entrapment or brachial plexopathy.   Recommendations: 1. Follow-up with referring physician. 2. Continue current management of symptoms.  ___________________________ Laurence Spates FAAPMR     Objective:  VS:  HT:    WT:   BMI:     BP:   HR: bpm  TEMP: ( )  RESP:  Physical Exam Musculoskeletal:        General: No swelling, tenderness or deformity.     Comments: Inspection reveals no atrophy of the bilateral APB or FDI or hand intrinsics.  There is well-healed surgical scar over the left carpal tunnel.  There is no swelling, color changes,  allodynia or dystrophic changes. There is 5 out of 5 strength in the bilateral wrist extension, finger abduction and long finger flexion. There is intact sensation to light touch in all dermatomal and peripheral nerve distributions. There is a positive Tinel's test at the left elbow. There is a negative Phalen's test bilaterally. There is a negative Hoffmann's test bilaterally.  Skin:    General: Skin is warm and dry.     Findings: No erythema or rash.  Neurological:     General: No focal deficit present.     Mental Status: She is alert and oriented to person, place, and time.     Motor: No weakness or abnormal muscle tone.     Coordination: Coordination normal.  Psychiatric:        Mood and Affect: Mood normal.        Behavior: Behavior normal.      Imaging: No results found.

## 2020-04-25 ENCOUNTER — Other Ambulatory Visit: Payer: Self-pay

## 2020-04-25 ENCOUNTER — Ambulatory Visit
Admission: RE | Admit: 2020-04-25 | Discharge: 2020-04-25 | Disposition: A | Discharge status: Home / Self Care | Payer: Medicaid Other | Source: Ambulatory Visit | Attending: Pulmonary Disease | Admitting: Pulmonary Disease

## 2020-04-25 DIAGNOSIS — R911 Solitary pulmonary nodule: Secondary | ICD-10-CM

## 2020-04-27 ENCOUNTER — Ambulatory Visit: Payer: Self-pay

## 2020-04-27 ENCOUNTER — Ambulatory Visit (INDEPENDENT_AMBULATORY_CARE_PROVIDER_SITE_OTHER): Payer: 59 | Admitting: Family Medicine

## 2020-04-27 ENCOUNTER — Other Ambulatory Visit: Payer: Self-pay

## 2020-04-27 DIAGNOSIS — M1611 Unilateral primary osteoarthritis, right hip: Secondary | ICD-10-CM | POA: Diagnosis not present

## 2020-04-27 NOTE — Patient Instructions (Addendum)
DUE TO COVID-19 ONLY ONE VISITOR IS ALLOWED TO COME WITH YOU AND STAY IN THE WAITING ROOM ONLY DURING PRE OP AND PROCEDURE DAY OF SURGERY. THE 1 VISITOR  MAY VISIT WITH YOU AFTER SURGERY IN YOUR PRIVATE ROOM DURING VISITING HOURS ONLY!  YOU NEED TO HAVE A COVID 19 TEST ON: 05/04/20 @  8:30 AM  , THIS TEST MUST BE DONE BEFORE SURGERY,  COVID TESTING SITE [place] [id number], IT IS ON THE RIGHT GOING OUT [address] APPROXIMATELY  2 MINUTES PAST [employer] ON THE RIGHT. ONCE YOUR COVID TEST IS COMPLETED,  PLEASE BEGIN THE QUARANTINE INSTRUCTIONS AS OUTLINED IN YOUR HANDOUT.                [name]    Your procedure is scheduled on: 05/08/20   Report to [facility] Main  Entrance   Report to admitting at: 9:00 AM     Call this number if you have problems the morning of surgery [phone]    Remember:   [place]:   DRINK 1 G2 drink [place] ( 8:00 AM), DRINK ALL OF THE  G2 DRINK AT ONE TIME.   NO SOLID FOOD AFTER 600 PM THE NIGHT BEFORE YOUR SURGERY. YOU MAY DRINK CLEAR FLUIDS. THE G2 DRINK YOU DRINK BEFORE YOU LEAVE HOME WILL BE THE LAST FLUIDS YOU DRINK BEFORE SURGERY.  PAIN IS EXPECTED AFTER SURGERY AND WILL NOT BE COMPLETELY ELIMINATED. AMBULATION AND TYLENOL WILL HELP REDUCE INCISIONAL AND GAS PAIN. MOVEMENT IS KEY!  YOU ARE EXPECTED TO BE OUT OF BED WITHIN 4 HOURS OF ADMISSION TO YOUR PATIENT ROOM.  SITTING IN THE RECLINER THROUGHOUT THE DAY IS IMPORTANT FOR DRINKING FLUIDS AND MOVING GAS THROUGHOUT THE GI TRACT.  COMPRESSION STOCKINGS SHOULD BE WORN [place] UNLESS YOU ARE WALKING.   INCENTIVE SPIROMETER SHOULD BE USED EVERY HOUR WHILE AWAKE TO DECREASE POST-OPERATIVE COMPLICATIONS SUCH AS PNEUMONIA.  WHEN DISCHARGED HOME, IT IS IMPORTANT TO CONTINUE TO WALK EVERY HOUR AND USE THE INCENTIVE SPIROMETER EVERY HOUR.   CLEAR LIQUID DIET  Foods Allowed                                                                      Foods Excluded  Coffee and tea, regular and decaf                             liquids that you cannot  Plain Jell-O any favor except red or purple                                           see through such as: Fruit ices (not with fruit pulp)                                     milk, soups, orange juice  Iced Popsicles  All solid food Carbonated beverages, regular and diet                                    Cranberry, grape and apple juices Sports drinks like Gatorade Lightly seasoned clear broth or consume(fat free) Sugar, honey syrup  Sample Menu Breakfast                                Lunch                                     Supper Cranberry juice                    Beef broth                            Chicken broth Jell-O                                     Grape juice                           Apple juice Coffee or tea                        Jell-O                                      Popsicle                                                Coffee or tea                        Coffee or tea  _____________________________________________________________________   BRUSH YOUR TEETH MORNING OF SURGERY AND RINSE YOUR MOUTH OUT, NO CHEWING GUM CANDY OR MINTS.    Take these medicines the morning of surgery with A SIP OF WATER: buspar,diltiazem,gabapentin,omeprazole,allopurinol,febuxostat (uloric). Use flonase and inhalers as usual.  How to Manage Your Diabetes Before and After Surgery  Why is it important to control my blood sugar before and after surgery? . Improving blood sugar levels before and after surgery helps healing and can limit problems. . A way of improving blood sugar control is eating a healthy diet by: o  Eating less sugar and carbohydrates o  Increasing activity/exercise o  Talking with your doctor about reaching your blood sugar goals . High blood sugars (greater than 180 mg/dL) can raise your  risk of infections and slow your recovery, so you will need to focus on controlling your diabetes during the weeks before surgery. . Make sure that the doctor who takes care of your diabetes knows about your planned surgery including the date and location.  How do I manage my blood sugar before surgery? . Check your blood sugar at least 4 times a day, starting 2 days before surgery, to make sure  that the level is not too high or low. o Check your blood sugar the morning of your surgery when you wake up and every 2 hours until you get to the Short Stay unit. . If your blood sugar is less than 70 mg/dL, you will need to treat for low blood sugar: o Do not take insulin. o Treat a low blood sugar (less than 70 mg/dL) with  cup of clear juice (cranberry or apple), 4 glucose tablets, OR glucose gel. o Recheck blood sugar in 15 minutes after treatment (to make sure it is greater than 70 mg/dL). If your blood sugar is not greater than 70 mg/dL on recheck, call [phone] for further instructions. . Report your blood sugar to the short stay nurse when you get to Short Stay.  . If you are admitted to the hospital after surgery: o Your blood sugar will be checked by the staff and you will probably be given insulin after surgery (instead of oral diabetes medicines) to make sure you have good blood sugar levels. o The goal for blood sugar control after surgery is 80-180 mg/dL.   WHAT DO I DO ABOUT MY DIABETES MEDICATION?  [name] Kitchen Do not take oral diabetes medicines (pills) the morning of surgery.  . THE DAY BEFORE SURGERY, take ozempic and Lantus insulin as usual.DO NOT take jardiance. Do NOT take the bedtime dose of Humalog.      . THE MORNING OF SURGERY,  . If your CBG is greater than 220 mg/dL, you may take  of your sliding scale  . (correction) dose of insulin.    DO NOT TAKE ANY ORAL DIABETIC MEDICATIONS DAY OF YOUR SURGERY                               You may not have any metal on your body  including hair pins and              piercings  Do not wear jewelry, make-up, lotions, powders or perfumes, deodorant             Do not wear nail polish on your fingernails.  Do not shave  48 hours prior to surgery.    Do not bring valuables to the hospital. [place].  Contacts, dentures or bridgework may not be worn into surgery.  Leave suitcase in the car. After surgery it may be brought to your room.     Patients discharged the day of surgery will not be allowed to drive home. IF YOU ARE HAVING SURGERY AND GOING HOME THE SAME DAY, YOU MUST HAVE AN ADULT TO DRIVE YOU HOME AND BE WITH YOU FOR 24 HOURS. YOU MAY GO HOME BY TAXI OR UBER OR ORTHERWISE, BUT AN ADULT MUST ACCOMPANY YOU HOME AND STAY WITH YOU FOR 24 HOURS.  Name and phone number of your driver:  Special Instructions: N/A              Please read over the following fact sheets you were given: _____________________________________________________________________             [facility] - Preparing for Surgery Before surgery, you can play an important role.  Because skin is not sterile, your skin needs to be as free of germs as possible.  You can reduce the number of germs on your skin  by washing with CHG (chlorahexidine gluconate) soap before surgery.  CHG is an antiseptic cleaner which kills germs and bonds with the skin to continue killing germs even after washing. Please DO NOT use if you have an allergy to CHG or antibacterial soaps.  If your skin becomes reddened/irritated stop using the CHG and inform your nurse when you arrive at Short Stay. Do not shave (including legs and underarms) for at least 48 hours prior to the first CHG shower.  You may shave your face/neck. Please follow these instructions carefully:  1.  Shower with CHG Soap the night before surgery and the  morning of Surgery.  2.  If you choose to wash your hair, wash your hair first as usual with your  normal   shampoo.  3.  After you shampoo, rinse your hair and body thoroughly to remove the  shampoo.                           4.  Use CHG as you would any other liquid soap.  You can apply chg directly  to the skin and wash                       Gently with a scrungie or clean washcloth.  5.  Apply the CHG Soap to your body ONLY FROM THE NECK DOWN.   Do not use on face/ open                           Wound or open sores. Avoid contact with eyes, ears mouth and genitals (private parts).                       Wash face,  Genitals (private parts) with your normal soap.             6.  Wash thoroughly, paying special attention to the area where your surgery  will be performed.  7.  Thoroughly rinse your body with warm water from the neck down.  8.  DO NOT shower/wash with your normal soap after using and rinsing off  the CHG Soap.                9.  Pat yourself dry with a clean towel.            10.  Wear clean pajamas.            11.  Place clean sheets on your bed the night of your first shower and do not  sleep with pets. Day of Surgery : Do not apply any lotions/deodorants the morning of surgery.  Please wear clean clothes to the hospital/surgery center.  FAILURE TO FOLLOW THESE INSTRUCTIONS MAY RESULT IN THE CANCELLATION OF YOUR SURGERY PATIENT SIGNATURE_________________________________  NURSE SIGNATURE__________________________________  ________________________________________________________________________

## 2020-04-27 NOTE — Progress Notes (Signed)
Office Visit Note   Patient: [name]           Date of Birth: Jun 21, 1965           MRN: [id number] Visit Date: 04/27/2020 Requested by: [name], MD [address],  [place] [address] PCP: [name], MD  Subjective: Chief Complaint  Patient presents with  . Right Hip - Pain    Requests another intra-articular cortisone injection. Having gastric sleeve surgery on 05/08/20.    HPI: She is here with persistent right hip pain, requesting injection.  She is getting ready to have bariatric surgery in the near future.                ROS:   All other systems were reviewed and are negative.  Objective: Vital Signs: LMP 04/10/2006   Physical Exam:  General:  Alert and oriented, in no acute distress. Pulm:  Breathing unlabored. Psy:  Normal mood, congruent affect.  Right hip:  Pain with passive IR.  Imaging: US Guided Needle Placement - No Linked Charges  Result Date: 04/27/2020 Ultrasound guided injection is preferred based studies that show increased duration, increased effect, greater accuracy, decreased procedural pain, increased response rate, and decreased cost with ultrasound guided versus blind injection.   Verbal informed consent obtained.  Time-out conducted.  Noted no overlying erythema, induration, or other signs of local infection. Ultrasound-guided right hip injection: After sterile prep with Betadine, injected 4 cc 0.25% bupivacaine without epinephrine and 6 mg betamethasone using a 22-gauge spinal needle, passing the needle through the iliofemoral ligament into the femoral head/neck junction.       Assessment & Plan: 1.  Right hip osteoarthritis -Injection given as above.  Follow-up as needed.     Procedures: No procedures performed        PMFS History: Patient Active Problem List   Diagnosis Date Noted  . Vaginal discharge 01/20/2020  . Prolapse of anterior vaginal wall 11/10/2019  . Skin ulcer of left thigh, limited to  breakdown of skin ([place]) 09/09/2019  . Ulnar nerve compression 08/06/2019  . Diabetic neuropathy associated with type 2 diabetes mellitus ([place]) 08/06/2019  . Body mass index 50.0-59.9, adult ([place]) 06/12/2019  . Impingement syndrome of left shoulder 05/07/2019  . Polyp of colon   . Polyp of ascending colon   . Rectal discomfort 02/12/2019  . Osteoarthritis of right hip 02/12/2019  . Nontraumatic tear of left supraspinatus tendon 12/30/2018  . Gout 12/17/2018  . Nontraumatic complete tear of right rotator cuff 12/08/2018  . Rotator cuff tear, right 12/01/2018  . Tobacco abuse 08/21/2018  . Right groin pain 07/20/2018  . Depressed mood 07/13/2018  . Paroxysmal SVT (supraventricular tachycardia) ([place]) 03/20/2018  . Pure hypercholesterolemia 03/20/2018  . Muscle cramping 02/25/2018  . Nodule of upper lobe of right lung 02/13/2018  . Falls, subsequent encounter 01/30/2018  . Pain in left foot 11/04/2017  . Essential hypertension 10/27/2017  . Solitary pulmonary nodule 10/07/2017  . Restrictive lung disease secondary to obesity 10/01/2017  . Polycythemia, secondary 10/01/2017  . Chronic viral hepatitis B without delta-agent ([place]) 07/08/2017  . COPD (chronic obstructive pulmonary disease) with chronic bronchitis ([place]) 06/27/2017  . Chronic kidney disease (CKD), stage IV (severe) ([place]) 01/14/2017  . Type 2 diabetes mellitus with stage 3 chronic kidney disease, with long-term current use of insulin ([place]) 12/12/2016  . Urge incontinence of urine 12/05/2016  . Trigger finger, left index finger 07/15/2016  . Back pain 04/07/2014  .  Morbid obesity ([place]) 10/02/2012  . Chronic diastolic CHF (congestive heart failure) ([place]) 04/07/2012  . GERD 01/26/2010  . Obstructive sleep apnea treated with BiPAP 02/03/2008  . FIBROCYSTIC BREAST DISEASE 10/28/2006  . Hyperlipidemia 03/27/2006  . Obesity hypoventilation syndrome ([place]) 03/27/2006  . Depression with anxiety 03/27/2006   Past Medical History:   Diagnosis Date  . Alcoholism ([place])   . Anxiety   . Arthritis 04-10-11   hips, shoulders, back  . Asthma   . Bipolar disorder ([place])   . Cardiomegaly   . Carpal tunnel syndrome, bilateral   . Cervical cancer ([place]) 1993   cervical, no treatment done, went away per pt  . Cervical dysplasia or atypia 04-10-11   '93- once dx.-got pregnant-no intervention, then postpartum, no dysplasia found  . CHF (congestive heart failure) ([place])    no cardiologist 2014 dx, none now  . Chronic hypoxemic respiratory failure ([place])   . Chronic kidney disease    stage 3 kidney disease  . 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 Pathology showed Condyloma, gluteal CONDYLOMA ACUMINATUM.   [name] Kitchen COPD (chronic obstructive pulmonary disease) ([place])   . Depression   . Diabetes mellitus without complication ([place])   . Dyspnea    with actity, sitting  . Fall 12/16/2016  . Fibrocystic breast disease   . GERD (gastroesophageal reflux disease)   . Grade I diastolic dysfunction 19/14/7829   Noted on ECHO  . Hepatitis    hep B-count is low at present,doesn't register  . History of PSVT (paroxysmal supraventricular tachycardia)   . Hypercalcemia   . Hyperlipidemia   . Hypertension   . Incomplete left bundle branch block (LBBB) 09/24/2018   Noted on EKG  . Lung nodule 11/2017   right and left lung  . Migraine headache    none recent  . Morbid obesity ([place]) 03/27/2006  . Neuropathy   . Pneumonia    walking pneu 15 yrs. ago  . Skin lesion 03/15/2011   In gluteal crease now s/p removal by Dr. [name] of General Surgery on 3/12. Path shows condyloma.     . Sleep apnea 04-10-11   uses cpap, pt does not know settings  . Trigger finger    left third  . Urge incontinence of urine     Family History  Problem Relation Age of Onset  . Pulmonary embolism Mother   . Stroke Father   . Alcohol abuse Father   . Pneumonia Father   . Hypertension Father   . Breast cancer Maternal Aunt   .  Hypertension Other   . Diabetes Other        Aunts and cousins  . Asthma Sister   . Depression Sister   . Arthritis Sister   . Sickle cell anemia Son     Past Surgical History:  Procedure Laterality Date  . ANAL FISTULECTOMY  04/17/2011   Procedure: FISTULECTOMY ANAL;  Surgeon: Imogene Burn. [name], MD;  Location: [facility];  Service: General;  Laterality: N/A;  Excision of Condyloma Gluteal Cleft   . ANAL RECTAL MANOMETRY N/A 03/26/2019   Procedure: ANO RECTAL MANOMETRY;  Surgeon: Thornton Park, MD;  Location: [facility];  Service: Gastroenterology;  Laterality: N/A;  . BIOPSY  03/09/2019   Procedure: BIOPSY;  Surgeon: Thornton Park, MD;  [facility]: [facility];  Service: Gastroenterology;;  . Lum Keas INJECTION N/A 12/29/2018   Procedure: BOTOX INJECTION(100 UNITS)  WITH CYSTOSCOPY;  Surgeon: Ceasar Mons, MD;  [facility];  Service: Urology;  Laterality: N/A;  . BOTOX INJECTION N/A 07/27/2019   Procedure: BOTOX INJECTION WITH CYSTOSCOPY;  Surgeon: Ceasar Mons, MD;  Location: [facility];  Service: Urology;  Laterality: N/A;  ONLY NEEDS 30 MIN  . BREAST CYST EXCISION     bilateral breast, 3 cysts removed from each breast  . BREAST EXCISIONAL BIOPSY Right    x 3  . BREAST EXCISIONAL BIOPSY Left    x 3  . CARPAL TUNNEL RELEASE Bilateral   . Cervical biospy    . CERVICAL FUSION  04-10-11   x3- cervical fusion with plating and screws-Dr. [name]  . COLONOSCOPY WITH PROPOFOL N/A 07/06/2015   Procedure: COLONOSCOPY WITH PROPOFOL;  Surgeon: [name], MD;  [facility];  Service: Endoscopy;  Laterality: N/A;  . COLONOSCOPY WITH PROPOFOL N/A 03/09/2019   Procedure: COLONOSCOPY WITH PROPOFOL;  Surgeon: Thornton Park, MD;  [facility];  Service: Gastroenterology;  Laterality: N/A;  . DOPPLER ECHOCARDIOGRAPHY  04/06/2012   AT [facility] 55-60%  . FOOT SURGERY Left 2018  . FRACTURE SURGERY     little toe left foot  . I & D EXTREMITY Left  12/18/2016   Procedure: IRRIGATION AND DEBRIDEMENT LEFT FOOT, CLOSURE;  Surgeon: [name], MD;  Location: [facility];  Service: Orthopedics;  Laterality: Left;  . Rosa Sanchez SURGERY  04-10-11   x5-Lumbar fusion-retained hardware.(Dr. Louanne Skye)  . MULTIPLE TOOTH EXTRACTIONS    . POLYPECTOMY  03/09/2019   Procedure: POLYPECTOMY;  Surgeon: Thornton Park, MD;  [facility];  Service: Gastroenterology;;  . SHOULDER ARTHROSCOPY WITH ROTATOR CUFF REPAIR AND SUBACROMIAL DECOMPRESSION Left 05/07/2019   Procedure: LEFT SHOULDER ARTHROSCOPY WITH DEBRIDEMENT, DISTAL CLAVICLE EXCISION,  SUBACROMIAL DECOMPRESSION AND POSSIBLE ROTATOR CUFF REPAIR;  Surgeon: [name], MD;  Location: [facility];  Service: Orthopedics;  Laterality: Left;  . TEE WITHOUT CARDIOVERSION N/A 04/06/2012   Procedure: TRANSESOPHAGEAL ECHOCARDIOGRAM (TEE);  Surgeon: [name], MD;  [facility];  Service: Cardiovascular;  Laterality: N/A;  . TRIGGER FINGER RELEASE Right    middle finger  . TRIGGER FINGER RELEASE Left 06/28/2016   Procedure: RELEASE TRIGGER FINGER LEFT 3RD FINGER;  Surgeon: [name], MD;  Location: [facility];  Service: Orthopedics;  Laterality: Left;  . TRIGGER FINGER RELEASE Right 06/12/2016   Procedure: RIGHT INDEX FINGER TRIGGER RELEASE;  Surgeon: [name], MD;  Location: [facility];  Service: Orthopedics;  Laterality: Right;  . TRIGGER FINGER RELEASE Right 01/19/2018   Procedure: RIGHT RING FINGER TRIGGER FINGER RELEASE;  Surgeon: [name], MD;  Location: [facility];  Service: Orthopedics;  Laterality: Right;  . TRIGGER FINGER RELEASE Left 05/07/2019   Procedure: RELEASE TRIGGER FINGER LEFT INDEX FINGER;  Surgeon: [name], MD;  Location: [facility];  Service: Orthopedics;  Laterality: Left;   Social History   Occupational History  . Occupation: disabled    Comment: Disabled  Tobacco Use  . Smoking status: Former Smoker    Packs/day: 0.20    Years: 40.00    Pack years: 8.00    Types:  Cigarettes    Start date: 01/28/1977    Quit date: 07/25/2019    Years since quitting: 0.7  . Smokeless tobacco: Never Used  . Tobacco comment: 4-5 cigarettes per day  Vaping Use  . Vaping Use: Never used  Substance and Sexual Activity  . Alcohol use: No    Alcohol/week: 0.0 standard drinks  . Drug use: Not Currently  Types: Marijuana, "Crack" cocaine    Comment: hx marijuana usemany years ago, Crack -none since 11/2015  . Sexual activity: Not Currently    Partners: Male    Birth control/protection: Post-menopausal

## 2020-04-28 ENCOUNTER — Other Ambulatory Visit: Payer: Self-pay

## 2020-04-28 ENCOUNTER — Encounter (HOSPITAL_COMMUNITY): Payer: Self-pay

## 2020-04-28 ENCOUNTER — Encounter (HOSPITAL_COMMUNITY)
Admission: RE | Admit: 2020-04-28 | Discharge: 2020-04-28 | Disposition: A | Discharge status: Home / Self Care | Payer: 59 | Source: Ambulatory Visit | Attending: General Surgery | Admitting: General Surgery

## 2020-04-28 ENCOUNTER — Ambulatory Visit: Payer: Self-pay | Admitting: Licensed Clinical Social Worker

## 2020-04-28 DIAGNOSIS — Z794 Long term (current) use of insulin: Secondary | ICD-10-CM | POA: Insufficient documentation

## 2020-04-28 DIAGNOSIS — N183 Chronic kidney disease, stage 3 unspecified: Secondary | ICD-10-CM | POA: Insufficient documentation

## 2020-04-28 DIAGNOSIS — Z79899 Other long term (current) drug therapy: Secondary | ICD-10-CM | POA: Insufficient documentation

## 2020-04-28 DIAGNOSIS — Z6841 Body Mass Index (BMI) 40.0 and over, adult: Secondary | ICD-10-CM | POA: Diagnosis not present

## 2020-04-28 DIAGNOSIS — Z87891 Personal history of nicotine dependence: Secondary | ICD-10-CM | POA: Insufficient documentation

## 2020-04-28 DIAGNOSIS — G4730 Sleep apnea, unspecified: Secondary | ICD-10-CM | POA: Insufficient documentation

## 2020-04-28 DIAGNOSIS — Z981 Arthrodesis status: Secondary | ICD-10-CM | POA: Diagnosis not present

## 2020-04-28 DIAGNOSIS — Z01812 Encounter for preprocedural laboratory examination: Secondary | ICD-10-CM | POA: Insufficient documentation

## 2020-04-28 DIAGNOSIS — Z7951 Long term (current) use of inhaled steroids: Secondary | ICD-10-CM | POA: Diagnosis not present

## 2020-04-28 DIAGNOSIS — I509 Heart failure, unspecified: Secondary | ICD-10-CM | POA: Diagnosis not present

## 2020-04-28 DIAGNOSIS — J449 Chronic obstructive pulmonary disease, unspecified: Secondary | ICD-10-CM | POA: Diagnosis not present

## 2020-04-28 DIAGNOSIS — I130 Hypertensive heart and chronic kidney disease with heart failure and stage 1 through stage 4 chronic kidney disease, or unspecified chronic kidney disease: Secondary | ICD-10-CM | POA: Insufficient documentation

## 2020-04-28 DIAGNOSIS — Z7984 Long term (current) use of oral hypoglycemic drugs: Secondary | ICD-10-CM | POA: Insufficient documentation

## 2020-04-28 DIAGNOSIS — E1122 Type 2 diabetes mellitus with diabetic chronic kidney disease: Secondary | ICD-10-CM | POA: Insufficient documentation

## 2020-04-28 DIAGNOSIS — Z741 Need for assistance with personal care: Secondary | ICD-10-CM

## 2020-04-28 LAB — CBC WITH DIFFERENTIAL/PLATELET
Abs Immature Granulocytes: 0.1 10*3/uL — ABNORMAL HIGH (ref 0.00–0.07)
Basophils Absolute: 0.1 10*3/uL (ref 0.0–0.1)
Basophils Relative: 0 %
Eosinophils Absolute: 0.1 10*3/uL (ref 0.0–0.5)
Eosinophils Relative: 0 %
HCT: 52.2 % — ABNORMAL HIGH (ref 36.0–46.0)
Hemoglobin: 18.1 g/dL — ABNORMAL HIGH (ref 12.0–15.0)
Immature Granulocytes: 1 %
Lymphocytes Relative: 16 %
Lymphs Abs: 2.3 10*3/uL (ref 0.7–4.0)
MCH: 28.6 pg (ref 26.0–34.0)
MCHC: 34.7 g/dL (ref 30.0–36.0)
MCV: 82.6 fL (ref 80.0–100.0)
Monocytes Absolute: 1.1 10*3/uL — ABNORMAL HIGH (ref 0.1–1.0)
Monocytes Relative: 7 %
Neutro Abs: 11.2 10*3/uL — ABNORMAL HIGH (ref 1.7–7.7)
Neutrophils Relative %: 76 %
Platelets: 403 10*3/uL — ABNORMAL HIGH (ref 150–400)
RBC: 6.32 MIL/uL — ABNORMAL HIGH (ref 3.87–5.11)
RDW: 14.7 % (ref 11.5–15.5)
WBC: 14.8 10*3/uL — ABNORMAL HIGH (ref 4.0–10.5)
nRBC: 0 % (ref 0.0–0.2)

## 2020-04-28 LAB — COMPREHENSIVE METABOLIC PANEL
ALT: 34 U/L (ref 0–44)
AST: 31 U/L (ref 15–41)
Albumin: 4.4 g/dL (ref 3.5–5.0)
Alkaline Phosphatase: 105 U/L (ref 38–126)
Anion gap: 15 (ref 5–15)
BUN: 40 mg/dL — ABNORMAL HIGH (ref 6–20)
CO2: 32 mmol/L (ref 22–32)
Calcium: 10.4 mg/dL — ABNORMAL HIGH (ref 8.9–10.3)
Chloride: 94 mmol/L — ABNORMAL LOW (ref 98–111)
Creatinine, Ser: 1.53 mg/dL — ABNORMAL HIGH (ref 0.44–1.00)
GFR, Estimated: 40 mL/min — ABNORMAL LOW (ref >60)
Glucose, Bld: 187 mg/dL — ABNORMAL HIGH (ref 70–99)
Potassium: 3.2 mmol/L — ABNORMAL LOW (ref 3.5–5.1)
Sodium: 141 mmol/L (ref 135–145)
Total Bilirubin: 0.8 mg/dL (ref 0.3–1.2)
Total Protein: 8.2 g/dL — ABNORMAL HIGH (ref 6.5–8.1)

## 2020-04-28 LAB — GLUCOSE, CAPILLARY: Glucose-Capillary: 230 mg/dL — ABNORMAL HIGH (ref 70–99)

## 2020-04-28 NOTE — Progress Notes (Signed)
Lab. Report: WBC: 14.8 Hemoglobin: 18.1 HCT: 52

## 2020-04-28 NOTE — Patient Instructions (Signed)
  Ms. Millward . Please call me directly if you have questions about your goals  Goals Addressed            This Visit's Progress   . Personal Care Services        Patient Goals/Self-Care Activities:  . Call [facility] to scheduled your assessment [phone] or [phone]       Ms. Bekker received Care Coordination services today:  1. Care Coordination services include personalized support from designated clinical staff supervised by her physician, including individualized plan of care and coordination with other care providers 2. 24/7 contact [phone] for assistance for urgent and routine care needs. 3. Care Coordination are voluntary services and be declined at any time by calling the office.   Follow up plan: SW will follow up with patient by phone over the next 7 to 10 days  [name], [place]

## 2020-04-28 NOTE — Chronic Care Management (AMB) (Signed)
Clinical Social Work  Care Management  Unsuccessful Phone Outreach    06/30/2295 Name: CHERINE DRUMGOOLE MRN: [id number] DOB: [birth year]  [name] is a 55 y.o. year old female who is a primary care patient of [name], MD .   F/U phone call today to assess needs, and progress with care plan goals.  Telephone outreach was unsuccessful A HIPPA compliant phone message was left for the patient providing contact information and requesting a return call.   Plan:LCSW will wait for return call. Will reach out to patient again in the next 7 to 10 days.   Assessment: [name] for faxed to [facility]. Review of patient past medical history, allergies, medications, and health status, including review of relevant consultants reports was performed today as part of a comprehensive evaluation and provision of chronic care management and care coordination services.  Advanced Directives Status: See Vynca application for related entries.  Care Plan  Allergies  Allergen Reactions  . Aspirin Other (See Comments)    Kidney disease  . 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 Corticosteroids Other (See Comments)    Hyperglycemia   . Levofloxacin Itching    Outpatient Encounter Medications as of 04/28/2020  Medication Sig Note  . Accu-Chek Softclix Lancets lancets TEST 4 TIMES DAILY   . acetaminophen (TYLENOL) 500 MG tablet Take 1,000 mg by mouth 2 (two) times daily.   [name] Kitchen albuterol (VENTOLIN HFA) 108 (90 Base) MCG/ACT inhaler Inhale 2 puffs into the lungs every 6 (six) hours as needed for wheezing or shortness of breath.   . allopurinol (ZYLOPRIM) 300 MG tablet Take 300 mg by mouth in the morning.   [name] Kitchen ammonium lactate (LAC-HYDRIN) 12 % lotion APPLY TWICE A DAY AS NEEDED FOR DRY SKIN (Patient taking differently: Apply 1 application topically in the morning and at bedtime.)   . atorvastatin (LIPITOR) 40 MG tablet TAKE 1 TABLET BY MOUTH EVERY DAY (Patient taking  differently: Take 40 mg by mouth in the morning.)   . b complex vitamins capsule Take 1 capsule by mouth daily.   . B-D UF III MINI PEN NEEDLES 31G X 5 MM MISC CHECK SUGARS 4 TIMES A DAY BEFORE MEALS AND AT BEDTIME.   . baclofen (LIORESAL) 10 MG tablet Take 1 tablet (10 mg total) by mouth 3 (three) times daily as needed for muscle spasms. (Patient taking differently: Take 10 mg by mouth 3 (three) times daily.)   . blood glucose meter kit and supplies KIT Dispense based on patient and insurance preference. Use up to four times daily as directed. (FOR ICD-9 250.00, 250.01).   . busPIRone (BUSPAR) 10 MG tablet Take 1 tablet (10 mg total) by mouth 3 (three) times daily.   . diclofenac Sodium (VOLTAREN) 1 % GEL APPLY 2 GRAMS TO AFFECTED AREA 4 TIMES A DAY   . diltiazem (CARDIZEM CD) 120 MG 24 hr capsule TAKE 1 CAPSULE (120 MG TOTAL) BY MOUTH 2 (TWO) TIMES DAILY.   [name] Kitchen docusate sodium (COLACE) 100 MG capsule Take 200 mg by mouth 2 (two) times daily.   . empagliflozin (JARDIANCE) 25 MG TABS tablet Take 1 tablet (25 mg total) by mouth daily. (Patient taking differently: Take 25 mg by mouth in the morning.)   . febuxostat (ULORIC) 40 MG tablet Take 40 mg by mouth daily.   . fluticasone (FLONASE) 50 MCG/ACT nasal spray Place 2 sprays into both nostrils 2 (two) times daily.   [name] Kitchen  Fluticasone-Umeclidin-Vilant (TRELEGY ELLIPTA) 100-62.5-25 MCG/INH AEPB Inhale 1 puff into the lungs daily.   . furosemide (LASIX) 40 MG tablet TAKE 2 TABLETS (80 MG TOTAL) BY MOUTH 2 (TWO) TIMES DAILY. (Patient taking differently: Take 80 mg by mouth 2 (two) times daily. Morning & lunch)   . gabapentin (NEURONTIN) 300 MG capsule Take 2 capsules (600 mg total) by mouth 3 (three) times daily. TAKE 2 CAPSULES (600 MG TOTAL) BY MOUTH in the morning and at lunch and take 3 capsules at night (Patient taking differently: Take 600 mg by mouth 3 (three) times daily.)   . glucose blood (ACCU-CHEK AVIVA PLUS) test strip 1 each by Other route 3  (three) times daily. TEST 3 TIMES DAILY   . HYDROcodone-acetaminophen (NORCO) 7.5-325 MG tablet Take 1 tablet by mouth every 6 (six) hours as needed for moderate pain.   . hydrOXYzine (ATARAX/VISTARIL) 10 MG tablet Take 1 tablet (10 mg total) by mouth daily.   . insulin glargine (LANTUS SOLOSTAR) 100 UNIT/ML Solostar Pen Inject 50 Units into the skin daily.   . insulin lispro (HUMALOG) 100 UNIT/ML KwikPen Inject 18 Units into the skin 3 (three) times daily before meals.   [name] Kitchen ketoconazole (NIZORAL) 2 % cream APPLY 1 FINGERTIP AMOUNT TO EACH FOOT DAILY. (Patient taking differently: Apply 1 application topically in the morning, at noon, and at bedtime.)   . losartan (COZAAR) 100 MG tablet Take 1 tablet (100 mg total) by mouth daily.   . metolazone (ZAROXOLYN) 2.5 MG tablet Take 1 tablet (2.5 mg total) by mouth 2 (two) times a week. TAKE 1 TABLET TWICE WEEKLY ON MONDAYS AND THURSDAYS ONLY   . montelukast (SINGULAIR) 10 MG tablet TAKE 1 TABLET BY MOUTH EVERYDAY AT BEDTIME (Patient taking differently: Take 10 mg by mouth at bedtime.)   . Multiple Vitamins-Minerals (MULTIVITAMIN WITH MINERALS) tablet Take 1 tablet by mouth daily.   [name] Kitchen NARCAN 4 MG/0.1ML LIQD nasal spray kit Place 1 spray into the nose as needed (accidental overdose). 07/27/2019: Never used  . NON FORMULARY Uses a C-PAP at bedtime   . nystatin (MYCOSTATIN) 100000 UNIT/ML suspension TAKE 5 MLS (500,000 UNITS TOTAL) BY MOUTH 4 (FOUR) TIMES DAILY. SWISH AND SWALLOW. (Patient taking differently: Take 5 mLs by mouth 4 (four) times daily as needed (thrush). Swish and swallow.)   . Omega-3 Fatty Acids (FISH OIL) 1000 MG CAPS Take 1,000 mg by mouth in the morning.   [name] Kitchen omeprazole (PRILOSEC) 20 MG capsule TAKE 1 CAPSULE BY MOUTH EVERY DAY   . Potassium Chloride ER 20 MEQ TBCR Take 20 mEq by mouth daily.   . Semaglutide,0.25 or 0.5MG/DOS, (OZEMPIC, 0.25 OR 0.5 MG/DOSE,) 2 MG/1.5ML SOPN Inject 0.25 mg into the skin once a week. Minus 2 clicks (Patient  taking differently: Inject 0.25 mg into the skin every Thursday. Minus 2 clicks)   . varenicline (CHANTIX CONTINUING MONTH PAK) 1 MG tablet Take 1 tablet (1 mg total) by mouth 2 (two) times daily.   [name] Kitchen venlafaxine XR (EFFEXOR-XR) 75 MG 24 hr capsule TAKE 1 CAPSULE BY MOUTH DAILY WITH BREAKFAST. (Patient taking differently: Take 75 mg by mouth daily with breakfast.)    Facility-Administered Encounter Medications as of 04/28/2020  Medication  . technetium tetrofosmin (TC-MYOVIEW) injection 29.5 millicurie    Patient Active Problem List   Diagnosis Date Noted  . Vaginal discharge 01/20/2020  . Prolapse of anterior vaginal wall 11/10/2019  . Skin ulcer of left thigh, limited to breakdown of skin ([place]) 09/09/2019  . Ulnar nerve  compression 08/06/2019  . Diabetic neuropathy associated with type 2 diabetes mellitus ([place]) 08/06/2019  . Body mass index 50.0-59.9, adult ([place]) 06/12/2019  . Impingement syndrome of left shoulder 05/07/2019  . Polyp of colon   . Polyp of ascending colon   . Rectal discomfort 02/12/2019  . Osteoarthritis of right hip 02/12/2019  . Nontraumatic tear of left supraspinatus tendon 12/30/2018  . Gout 12/17/2018  . Nontraumatic complete tear of right rotator cuff 12/08/2018  . Rotator cuff tear, right 12/01/2018  . Tobacco abuse 08/21/2018  . Right groin pain 07/20/2018  . Depressed mood 07/13/2018  . Paroxysmal SVT (supraventricular tachycardia) ([place]) 03/20/2018  . Pure hypercholesterolemia 03/20/2018  . Muscle cramping 02/25/2018  . Nodule of upper lobe of right lung 02/13/2018  . Falls, subsequent encounter 01/30/2018  . Pain in left foot 11/04/2017  . Essential hypertension 10/27/2017  . Solitary pulmonary nodule 10/07/2017  . Restrictive lung disease secondary to obesity 10/01/2017  . Polycythemia, secondary 10/01/2017  . Chronic viral hepatitis B without delta-agent ([place]) 07/08/2017  . COPD (chronic obstructive pulmonary disease) with chronic bronchitis ([place])  06/27/2017  . Chronic kidney disease (CKD), stage IV (severe) ([place]) 01/14/2017  . Type 2 diabetes mellitus with stage 3 chronic kidney disease, with long-term current use of insulin ([place]) 12/12/2016  . Urge incontinence of urine 12/05/2016  . Trigger finger, left index finger 07/15/2016  . Back pain 04/07/2014  . Morbid obesity ([place]) 10/02/2012  . Chronic diastolic CHF (congestive heart failure) ([place]) 04/07/2012  . GERD 01/26/2010  . Obstructive sleep apnea treated with BiPAP 02/03/2008  . FIBROCYSTIC BREAST DISEASE 10/28/2006  . Hyperlipidemia 03/27/2006  . Obesity hypoventilation syndrome ([place]) 03/27/2006  . Depression with anxiety 03/27/2006    Conditions to be addressed/monitored: Level of care concerns  Care Plan : Clinical Social Work  Updates made by [name], LCSW since 04/28/2020 12:00 AM  Problem: Mobility and Independence   Goal: Mobility and Independence Optimized   Start Date: 04/06/2020  Expected End Date: 05/27/2020  Recent Progress: On track  Priority: High  Current Barriers:    Patient would like to wait for change in condition form to be completed by PCP   Patient's [name] hours have been reduced from 80 to 46.  States this is because she started over. Will need [name] change of status form to get increase in hours  Patient unable to consistently perform activities of daily living and needs assistance and support in order to meet this unmet need  Currently unable to independently self manage needs related to chronic health conditions.   Knowledge Deficits related to short term plan for care coordination needs and long term plans for chronic disease management needs Clinical Goals: Over the next 45 to 60 days, patient will have personal care needs met as evident by having [name] Aide in the home assisting with needs.  Clinical Interventions : . Assessed needs, level of care concerns reference Personal Care Service process,  . Collaborate with primary care provider  ref completing [name] referral  . [name] Referral faxed 04/28/2020; left voice message for patient to inform her form has been faxed. . Patient applies for CAPS program but states she was denied . Other interventions provided: Emotional Support and reflective listing  Patient Goals/Self-Care Activities: Over the next 30 days . Call [facility] to scheduled you assessment [phone] or [place], [place] / [place]  [phone] 1:49 PM

## 2020-04-28 NOTE — Progress Notes (Signed)
COVID Vaccine Completed: Yes Date COVID Vaccine completed: 01/11/20 [place] COVID vaccine manufacturer: Pfizer     PCP - Dr. [name] Cardiologist - Dr. [name]  Chest x-ray -  EKG - 07/15/19 Stress Test -  ECHO - 10/01/17 Cardiac Cath -  Pacemaker/ICD device last checked: A1-C:8.8: 03/17/20 Sleep Study - YES CPAP - Yes  Fasting Blood Sugar - 150's Checks Blood Sugar __3___ times a day  Blood Thinner Instructions: Aspirin Instructions: Last Dose:  Anesthesia review: Hx: Incomplete Lt. BBB.CHF,DIA,OSA(CPAP)  Patient denies shortness of breath, fever, cough and chest pain at PAT appointment   Patient verbalized understanding of instructions that were given to them at the PAT appointment. Patient was also instructed that they will need to review over the PAT instructions again at home before surgery.

## 2020-05-01 NOTE — Anesthesia Preprocedure Evaluation (Deleted)
Anesthesia Evaluation    Airway        Dental   Pulmonary Patient abstained from smoking., former smoker,           Cardiovascular hypertension,      Neuro/Psych    GI/Hepatic   Endo/Other  diabetes  Renal/GU      Musculoskeletal   Abdominal   Peds  Hematology   Anesthesia Other Findings   Reproductive/Obstetrics                             Anesthesia Physical Anesthesia Plan Anesthesia Quick Evaluation

## 2020-05-01 NOTE — Anesthesia Preprocedure Evaluation (Addendum)
Anesthesia Evaluation  Patient identified by MRN, date of birth, ID band Patient awake    Reviewed: Allergy & Precautions, NPO status , Patient's Chart, lab work & pertinent test results  Airway Mallampati: II  TM Distance: >3 FB     Dental   Pulmonary shortness of breath, asthma , sleep apnea , pneumonia, COPD, Patient abstained from smoking., former smoker,    breath sounds clear to auscultation       Cardiovascular hypertension, +CHF  + dysrhythmias  Rhythm:Regular Rate:Normal     Neuro/Psych  Headaches, Anxiety Depression Bipolar Disorder  Neuromuscular disease    GI/Hepatic (+) Hepatitis -  Endo/Other  diabetes  Renal/GU Renal disease     Musculoskeletal   Abdominal   Peds  Hematology   Anesthesia Other Findings   Reproductive/Obstetrics                            Anesthesia Physical Anesthesia Plan  ASA: III  Anesthesia Plan: General   Post-op Pain Management:    Induction: Intravenous  PONV Risk Score and Plan: 3 and Ondansetron, Dexamethasone and Midazolam  Airway Management Planned:   Additional Equipment:   Intra-op Plan:   Post-operative Plan: Extubation in OR  Informed Consent: I have reviewed the patients History and Physical, chart, labs and discussed the procedure including the risks, benefits and alternatives for the proposed anesthesia with the patient or authorized representative who has indicated his/her understanding and acceptance.     Dental advisory given  Plan Discussed with: CRNA and Anesthesiologist  Anesthesia Plan Comments: (See PAT note 04/28/2020, Konrad Felix, PA-C)       Anesthesia Quick Evaluation

## 2020-05-01 NOTE — Progress Notes (Signed)
Anesthesia Chart Review   Case: [id number] Date/Time: 05/08/20 1045   Procedures:      LAPAROSCOPIC GASTRIC SLEEVE RESECTION (N/A )     UPPER GI ENDOSCOPY (N/A )   Anesthesia type: General   Pre-op diagnosis: MORBID OBESITY   [facility]   Surgeons: Kinsinger, Arta Bruce, MD      DISCUSSION:55 y.o. former smoker with h/o HTN, CHF, sleep apnea, COPD, DM II, CKD, morbid obesity scheduled for above procedure 05/08/20 with Dr. Gurney Maxin.   Pt last seen by cardiology 01/12/2020. Per OV note, "In terms of surgery, she has no chest pain and no new symptoms. She has had a myoview within the last year. She is acceptable risk for surgery, see below, and is optimized from a cardiovascular standpoint."  Anticipate pt can proceed with planned procedure barring acute status change.   VS: BP (!) 155/89   Pulse 89   Temp 36.6 C (Oral)   Ht '5\' 9"'  (1.753 m)   Wt (!) 155.6 kg   LMP 04/10/2006   SpO2 93%   BMI 50.65 kg/m   PROVIDERS: [name], MD is PCP   [name], MD is Cardiologist  LABS: Labs reviewed: Acceptable for surgery. (all labs ordered are listed, but only abnormal results are displayed)  Labs Reviewed  CBC WITH DIFFERENTIAL/PLATELET - Abnormal; Notable for the following components:      Result Value   WBC 14.8 (*)    RBC 6.32 (*)    Hemoglobin 18.1 (*)    HCT 52.2 (*)    Platelets 403 (*)    Neutro Abs 11.2 (*)    Monocytes Absolute 1.1 (*)    Abs Immature Granulocytes 0.10 (*)    All other components within normal limits  COMPREHENSIVE METABOLIC PANEL - Abnormal; Notable for the following components:   Potassium 3.2 (*)    Chloride 94 (*)    Glucose, Bld 187 (*)    BUN 40 (*)    Creatinine, Ser 1.53 (*)    Calcium 10.4 (*)    Total Protein 8.2 (*)    GFR, Estimated 40 (*)    All other components within normal limits  GLUCOSE, CAPILLARY - Abnormal; Notable for the following components:   Glucose-Capillary 230 (*)    All  other components within normal limits  TYPE AND SCREEN     IMAGES:   EKG: 07/15/2019 Rate 77bpm  NSR  CV: Stress Test 04/13/2019  The left ventricular ejection fraction is mildly decreased (45-54%).  Nuclear stress EF: 47%.  There was no ST segment deviation noted during stress.  This is an intermediate risk study due to reduced systolic function. There is no ischemia.  New wall motion abnormalities noted. Consider echo if clinically warranted.   Echo 10/01/2017 Study Conclusions   - Left ventricle: The cavity size was normal. Wall thickness was  normal. Systolic function was normal. The estimated ejection  fraction was in the range of 55% to 60%. Wall motion was normal;  there were no regional wall motion abnormalities. Doppler  parameters are consistent with abnormal left ventricular  relaxation (grade 1 diastolic dysfunction). Past Medical History:  Diagnosis Date  . Alcoholism ([place])   . Anxiety   . Arthritis 04-10-11   hips, shoulders, back  . Asthma   . Bipolar disorder ([place])   . Cardiomegaly   . Carpal tunnel syndrome, bilateral   . Cervical cancer ([place]) 1993   cervical, no treatment done, went away  per pt  . Cervical dysplasia or atypia 04-10-11   '93- once dx.-got pregnant-no intervention, then postpartum, no dysplasia found  . CHF (congestive heart failure) ([place])    no cardiologist 2014 dx, none now  . Chronic hypoxemic respiratory failure ([place])   . Chronic kidney disease    stage 3 kidney disease  . 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 Pathology showed Condyloma, gluteal CONDYLOMA ACUMINATUM.   [name] Kitchen COPD (chronic obstructive pulmonary disease) ([place])   . Depression   . Diabetes mellitus without complication ([place])   . Dyspnea    with actity, sitting  . Fall 12/16/2016  . Fibrocystic breast disease   . GERD (gastroesophageal reflux disease)   . Grade I diastolic dysfunction 99/35/7017   Noted on ECHO  . Hepatitis     hep B-count is low at present,doesn't register  . History of PSVT (paroxysmal supraventricular tachycardia)   . Hypercalcemia   . Hyperlipidemia   . Hypertension   . Incomplete left bundle branch block (LBBB) 09/24/2018   Noted on EKG  . Lung nodule 11/2017   right and left lung  . Migraine headache    none recent  . Morbid obesity ([place]) 03/27/2006  . Neuropathy   . Pneumonia    walking pneu 15 yrs. ago  . Skin lesion 03/15/2011   In gluteal crease now s/p removal by Dr. [name] of General Surgery on 3/12. Path shows condyloma.     . Sleep apnea 04-10-11   uses cpap, pt does not know settings  . Trigger finger    left third  . Urge incontinence of urine     Past Surgical History:  Procedure Laterality Date  . ANAL FISTULECTOMY  04/17/2011   Procedure: FISTULECTOMY ANAL;  Surgeon: Imogene Burn. [name], MD;  [facility];  Service: General;  Laterality: N/A;  Excision of Condyloma Gluteal Cleft   . ANAL RECTAL MANOMETRY N/A 03/26/2019   Procedure: ANO RECTAL MANOMETRY;  Surgeon: Thornton Park, MD;  [facility];  Service: Gastroenterology;  Laterality: N/A;  . BIOPSY  03/09/2019   Procedure: BIOPSY;  Surgeon: Thornton Park, MD;  [facility];  Service: Gastroenterology;;  . Lum Keas INJECTION N/A 12/29/2018   Procedure: BOTOX INJECTION(100 UNITS)  WITH CYSTOSCOPY;  Surgeon: Ceasar Mons, MD;  [facility];  Service: Urology;  Laterality: N/A;  . BOTOX INJECTION N/A 07/27/2019   Procedure: BOTOX INJECTION WITH CYSTOSCOPY;  Surgeon: Ceasar Mons, MD;  [facility];  Service: Urology;  Laterality: N/A;  ONLY NEEDS 30 MIN  . BREAST CYST EXCISION     bilateral breast, 3 cysts removed from each breast  . BREAST EXCISIONAL BIOPSY Right    x 3  . BREAST EXCISIONAL BIOPSY Left    x 3  . CARPAL TUNNEL RELEASE Bilateral   . Cervical biospy    . CERVICAL FUSION  04-10-11   x3- cervical fusion with plating and screws-Dr. [name]  .  COLONOSCOPY WITH PROPOFOL N/A 07/06/2015   Procedure: COLONOSCOPY WITH PROPOFOL;  Surgeon: [name], MD;  [facility];  Service: Endoscopy;  Laterality: N/A;  . COLONOSCOPY WITH PROPOFOL N/A 03/09/2019   Procedure: COLONOSCOPY WITH PROPOFOL;  Surgeon: Thornton Park, MD;  [facility];  Service: Gastroenterology;  Laterality: N/A;  . DOPPLER ECHOCARDIOGRAPHY  04/06/2012   AT [place] 55-60%  . FOOT SURGERY Left 2018  . FRACTURE SURGERY     little toe left foot  . I &  D EXTREMITY Left 12/18/2016   Procedure: IRRIGATION AND DEBRIDEMENT LEFT FOOT, CLOSURE;  Surgeon: [name], MD;  Location: [place];  Service: Orthopedics;  Laterality: Left;  . [place] SURGERY  04-10-11   x5-Lumbar fusion-retained hardware.(Dr. Louanne Skye)  . MULTIPLE TOOTH EXTRACTIONS    . POLYPECTOMY  03/09/2019   Procedure: POLYPECTOMY;  Surgeon: Thornton Park, MD;  [facility];  Service: Gastroenterology;;  . SHOULDER ARTHROSCOPY WITH ROTATOR CUFF REPAIR AND SUBACROMIAL DECOMPRESSION Left 05/07/2019   Procedure: LEFT SHOULDER ARTHROSCOPY WITH DEBRIDEMENT, DISTAL CLAVICLE EXCISION,  SUBACROMIAL DECOMPRESSION AND POSSIBLE ROTATOR CUFF REPAIR;  Surgeon: [name], MD;  Location: [place];  Service: Orthopedics;  Laterality: Left;  . TEE WITHOUT CARDIOVERSION N/A 04/06/2012   Procedure: TRANSESOPHAGEAL ECHOCARDIOGRAM (TEE);  Surgeon: [name], MD;  Location: [facility];  Service: Cardiovascular;  Laterality: N/A;  . TRIGGER FINGER RELEASE Right    middle finger  . TRIGGER FINGER RELEASE Left 06/28/2016   Procedure: RELEASE TRIGGER FINGER LEFT 3RD FINGER;  Surgeon: [name], MD;  Location: [place];  Service: Orthopedics;  Laterality: Left;  . TRIGGER FINGER RELEASE Right 06/12/2016   Procedure: RIGHT INDEX FINGER TRIGGER RELEASE;  Surgeon: [name], MD;  Location: [place];  Service: Orthopedics;  Laterality: Right;  . TRIGGER FINGER RELEASE Right 01/19/2018   Procedure:  RIGHT RING FINGER TRIGGER FINGER RELEASE;  Surgeon: [name], MD;  Location: [place];  Service: Orthopedics;  Laterality: Right;  . TRIGGER FINGER RELEASE Left 05/07/2019   Procedure: RELEASE TRIGGER FINGER LEFT INDEX FINGER;  Surgeon: [name], MD;  Location: [facility];  Service: Orthopedics;  Laterality: Left;    MEDICATIONS: . Accu-Chek Softclix Lancets lancets  . acetaminophen (TYLENOL) 500 MG tablet  . albuterol (VENTOLIN HFA) 108 (90 Base) MCG/ACT inhaler  . allopurinol (ZYLOPRIM) 300 MG tablet  . ammonium lactate (LAC-HYDRIN) 12 % lotion  . atorvastatin (LIPITOR) 40 MG tablet  . b complex vitamins capsule  . B-D UF III MINI PEN NEEDLES 31G X 5 MM MISC  . baclofen (LIORESAL) 10 MG tablet  . blood glucose meter kit and supplies KIT  . busPIRone (BUSPAR) 10 MG tablet  . diclofenac Sodium (VOLTAREN) 1 % GEL  . diltiazem (CARDIZEM CD) 120 MG 24 hr capsule  . docusate sodium (COLACE) 100 MG capsule  . empagliflozin (JARDIANCE) 25 MG TABS tablet  . febuxostat (ULORIC) 40 MG tablet  . fluticasone (FLONASE) 50 MCG/ACT nasal spray  . Fluticasone-Umeclidin-Vilant (TRELEGY ELLIPTA) 100-62.5-25 MCG/INH AEPB  . furosemide (LASIX) 40 MG tablet  . gabapentin (NEURONTIN) 300 MG capsule  . glucose blood (ACCU-CHEK AVIVA PLUS) test strip  . HYDROcodone-acetaminophen (NORCO) 7.5-325 MG tablet  . hydrOXYzine (ATARAX/VISTARIL) 10 MG tablet  . insulin glargine (LANTUS SOLOSTAR) 100 UNIT/ML Solostar Pen  . insulin lispro (HUMALOG) 100 UNIT/ML KwikPen  . ketoconazole (NIZORAL) 2 % cream  . losartan (COZAAR) 100 MG tablet  . metolazone (ZAROXOLYN) 2.5 MG tablet  . montelukast (SINGULAIR) 10 MG tablet  . Multiple Vitamins-Minerals (MULTIVITAMIN WITH MINERALS) tablet  . NARCAN 4 MG/0.1ML LIQD nasal spray kit  . NON FORMULARY  . nystatin (MYCOSTATIN) 100000 UNIT/ML suspension  . Omega-3 Fatty Acids (FISH OIL) 1000 MG CAPS  . omeprazole (PRILOSEC) 20 MG capsule  . Potassium Chloride ER 20 MEQ  TBCR  . Semaglutide,0.25 or 0.5MG/DOS, (OZEMPIC, 0.25 OR 0.5 MG/DOSE,) 2 MG/1.5ML SOPN  . varenicline (CHANTIX CONTINUING MONTH PAK) 1 MG tablet  . venlafaxine XR (EFFEXOR-XR) 75 MG  24 hr capsule   No current facility-administered medications for this encounter.   . technetium tetrofosmin (TC-MYOVIEW) injection 17.4 millicurie   Konrad Felix, PA-C [facility] [phone] '@TODAY' @ 3:53 PM

## 2020-05-02 ENCOUNTER — Ambulatory Visit (INDEPENDENT_AMBULATORY_CARE_PROVIDER_SITE_OTHER): Payer: 59 | Admitting: Orthopaedic Surgery

## 2020-05-02 DIAGNOSIS — G5622 Lesion of ulnar nerve, left upper limb: Secondary | ICD-10-CM | POA: Insufficient documentation

## 2020-05-02 NOTE — Progress Notes (Signed)
Office Visit Note   Patient: [name]           Date of Birth: 02-15-1965           MRN: [id number] Visit Date: 05/02/2020              Requested by: [name], MD [address],  [place] [address] PCP: [name], MD   Assessment & Plan: Visit Diagnoses:  1. Cubital tunnel syndrome on left     Plan: Nerve conduction studies showed moderate left cubital tunnel syndrome.  These findings were independently reviewed and interpreted and also discussed with the patient today.  I think ultimately she will opt for surgical repair decompression but given the fact that she is having gastric sleeve surgery in a week we will postpone any other elective surgeries until she has recovered.  I will see her back as needed.  Follow-Up Instructions: Return if symptoms worsen or fail to improve.   Orders:  No orders of the defined types were placed in this encounter.  No orders of the defined types were placed in this encounter.     Procedures: No procedures performed   Clinical Data: No additional findings.   Subjective: Chief Complaint  Patient presents with  . Right Hip - Pain    F/u injection    [name] returns today for review of recent nerve conduction studies looking for cubital tunnel syndrome.  She also had really good relief from recent right hip joint cortisone injection by Dr. Junius Roads.  She has gastric sleeve surgery scheduled for 05/08/2020.   Review of Systems  Constitutional: Negative.   HENT: Negative.   Eyes: Negative.   Respiratory: Negative.   Cardiovascular: Negative.   Endocrine: Negative.   Musculoskeletal: Negative.   Neurological: Negative.   Hematological: Negative.   Psychiatric/Behavioral: Negative.   All other systems reviewed and are negative.    Objective: Vital Signs: LMP 04/10/2006   Physical Exam Vitals and nursing note reviewed.  Constitutional:      Appearance: She is well-developed.  Pulmonary:      Effort: Pulmonary effort is normal.  Skin:    General: Skin is warm.     Capillary Refill: Capillary refill takes less than 2 seconds.  Neurological:     Mental Status: She is alert and oriented to person, place, and time.  Psychiatric:        Behavior: Behavior normal.        Thought Content: Thought content normal.        Judgment: Judgment normal.     Ortho Exam Right hip and left elbow exams are unchanged. Specialty Comments:  No specialty comments available.  Imaging: No results found.   PMFS History: Patient Active Problem List   Diagnosis Date Noted  . Cubital tunnel syndrome on left 05/02/2020  . Vaginal discharge 01/20/2020  . Prolapse of anterior vaginal wall 11/10/2019  . Skin ulcer of left thigh, limited to breakdown of skin ([place]) 09/09/2019  . Ulnar nerve compression 08/06/2019  . Diabetic neuropathy associated with type 2 diabetes mellitus ([place]) 08/06/2019  . Body mass index 50.0-59.9, adult ([place]) 06/12/2019  . Impingement syndrome of left shoulder 05/07/2019  . Polyp of colon   . Polyp of ascending colon   . Rectal discomfort 02/12/2019  . Osteoarthritis of right hip 02/12/2019  . Nontraumatic tear of left supraspinatus tendon 12/30/2018  . Gout 12/17/2018  . Nontraumatic complete tear of right rotator cuff 12/08/2018  .  Rotator cuff tear, right 12/01/2018  . Tobacco abuse 08/21/2018  . Right groin pain 07/20/2018  . Depressed mood 07/13/2018  . Paroxysmal SVT (supraventricular tachycardia) ([place]) 03/20/2018  . Pure hypercholesterolemia 03/20/2018  . Muscle cramping 02/25/2018  . Nodule of upper lobe of right lung 02/13/2018  . Falls, subsequent encounter 01/30/2018  . Pain in left foot 11/04/2017  . Essential hypertension 10/27/2017  . Solitary pulmonary nodule 10/07/2017  . Restrictive lung disease secondary to obesity 10/01/2017  . Polycythemia, secondary 10/01/2017  . Chronic viral hepatitis B without delta-agent ([place]) 07/08/2017  . COPD  (chronic obstructive pulmonary disease) with chronic bronchitis ([place]) 06/27/2017  . Chronic kidney disease (CKD), stage IV (severe) ([place]) 01/14/2017  . Type 2 diabetes mellitus with stage 3 chronic kidney disease, with long-term current use of insulin ([place]) 12/12/2016  . Urge incontinence of urine 12/05/2016  . Trigger finger, left index finger 07/15/2016  . Back pain 04/07/2014  . Morbid obesity ([place]) 10/02/2012  . Chronic diastolic CHF (congestive heart failure) ([place]) 04/07/2012  . GERD 01/26/2010  . Obstructive sleep apnea treated with BiPAP 02/03/2008  . FIBROCYSTIC BREAST DISEASE 10/28/2006  . Hyperlipidemia 03/27/2006  . Obesity hypoventilation syndrome ([place]) 03/27/2006  . Depression with anxiety 03/27/2006   Past Medical History:  Diagnosis Date  . Alcoholism ([place])   . Anxiety   . Arthritis 04-10-11   hips, shoulders, back  . Asthma   . Bipolar disorder ([place])   . Cardiomegaly   . Carpal tunnel syndrome, bilateral   . Cervical cancer ([place]) 1993   cervical, no treatment done, went away per pt  . Cervical dysplasia or atypia 04-10-11   '93- once dx.-got pregnant-no intervention, then postpartum, no dysplasia found  . CHF (congestive heart failure) ([place])    no cardiologist 2014 dx, none now  . Chronic hypoxemic respiratory failure ([place])   . Chronic kidney disease    stage 3 kidney disease  . 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 Pathology showed Condyloma, gluteal CONDYLOMA ACUMINATUM.   [name] Kitchen COPD (chronic obstructive pulmonary disease) ([place])   . Depression   . Diabetes mellitus without complication ([place])   . Dyspnea    with actity, sitting  . Fall 12/16/2016  . Fibrocystic breast disease   . GERD (gastroesophageal reflux disease)   . Grade I diastolic dysfunction 33/29/5188   Noted on ECHO  . Hepatitis    hep B-count is low at present,doesn't register  . History of PSVT (paroxysmal supraventricular tachycardia)   . Hypercalcemia   .  Hyperlipidemia   . Hypertension   . Incomplete left bundle branch block (LBBB) 09/24/2018   Noted on EKG  . Lung nodule 11/2017   right and left lung  . Migraine headache    none recent  . Morbid obesity ([place]) 03/27/2006  . Neuropathy   . Pneumonia    walking pneu 15 yrs. ago  . Skin lesion 03/15/2011   In gluteal crease now s/p removal by Dr. [name] of General Surgery on 3/12. Path shows condyloma.     . Sleep apnea 04-10-11   uses cpap, pt does not know settings  . Trigger finger    left third  . Urge incontinence of urine     Family History  Problem Relation Age of Onset  . Pulmonary embolism Mother   . Stroke Father   . Alcohol abuse Father   . Pneumonia Father   . Hypertension Father   . Breast cancer Maternal  Aunt   . Hypertension Other   . Diabetes Other        Aunts and cousins  . Asthma Sister   . Depression Sister   . Arthritis Sister   . Sickle cell anemia Son     Past Surgical History:  Procedure Laterality Date  . ANAL FISTULECTOMY  04/17/2011   Procedure: FISTULECTOMY ANAL;  Surgeon: Imogene Burn. [name], MD;  [facility];  Service: General;  Laterality: N/A;  Excision of Condyloma Gluteal Cleft   . ANAL RECTAL MANOMETRY N/A 03/26/2019   Procedure: ANO RECTAL MANOMETRY;  Surgeon: Thornton Park, MD;  [facility];  Service: Gastroenterology;  Laterality: N/A;  . BIOPSY  03/09/2019   Procedure: BIOPSY;  Surgeon: Thornton Park, MD;  [facility];  Service: Gastroenterology;;  . Lum Keas INJECTION N/A 12/29/2018   Procedure: BOTOX INJECTION(100 UNITS)  WITH CYSTOSCOPY;  Surgeon: Ceasar Mons, MD;  [facility];  Service: Urology;  Laterality: N/A;  . BOTOX INJECTION N/A 07/27/2019   Procedure: BOTOX INJECTION WITH CYSTOSCOPY;  Surgeon: Ceasar Mons, MD;  [facility];  Service: Urology;  Laterality: N/A;  ONLY NEEDS 30 MIN  . BREAST CYST EXCISION     bilateral breast, 3 cysts removed from each breast  .  BREAST EXCISIONAL BIOPSY Right    x 3  . BREAST EXCISIONAL BIOPSY Left    x 3  . CARPAL TUNNEL RELEASE Bilateral   . Cervical biospy    . CERVICAL FUSION  04-10-11   x3- cervical fusion with plating and screws-Dr. [name]  . COLONOSCOPY WITH PROPOFOL N/A 07/06/2015   Procedure: COLONOSCOPY WITH PROPOFOL;  Surgeon: [name], MD;  [facility];  Service: Endoscopy;  Laterality: N/A;  . COLONOSCOPY WITH PROPOFOL N/A 03/09/2019   Procedure: COLONOSCOPY WITH PROPOFOL;  Surgeon: Thornton Park, MD;  [facility];  Service: Gastroenterology;  Laterality: N/A;  . DOPPLER ECHOCARDIOGRAPHY  04/06/2012   AT [place] 55-60%  . FOOT SURGERY Left 2018  . FRACTURE SURGERY     little toe left foot  . I & D EXTREMITY Left 12/18/2016   Procedure: IRRIGATION AND DEBRIDEMENT LEFT FOOT, CLOSURE;  Surgeon: [name], MD;  Location: [place];  Service: Orthopedics;  Laterality: Left;  . [place] SURGERY  04-10-11   x5-Lumbar fusion-retained hardware.(Dr. Louanne Skye)  . MULTIPLE TOOTH EXTRACTIONS    . POLYPECTOMY  03/09/2019   Procedure: POLYPECTOMY;  Surgeon: Thornton Park, MD;  [facility];  Service: Gastroenterology;;  . SHOULDER ARTHROSCOPY WITH ROTATOR CUFF REPAIR AND SUBACROMIAL DECOMPRESSION Left 05/07/2019   Procedure: LEFT SHOULDER ARTHROSCOPY WITH DEBRIDEMENT, DISTAL CLAVICLE EXCISION,  SUBACROMIAL DECOMPRESSION AND POSSIBLE ROTATOR CUFF REPAIR;  Surgeon: [name], MD;  Location: [place] [facility];  Service: Orthopedics;  Laterality: Left;  . TEE WITHOUT CARDIOVERSION N/A 04/06/2012   Procedure: TRANSESOPHAGEAL ECHOCARDIOGRAM (TEE);  Surgeon: [name], MD;  Location[facility] ENDOSCOPY;  Service: Cardiovascular;  Laterality: N/A;  . TRIGGER FINGER RELEASE Right    middle finger  . TRIGGER FINGER RELEASE Left 06/28/2016   Procedure: RELEASE TRIGGER FINGER LEFT 3RD FINGER;  Surgeon: [name], MD;  Location: [facility];  Service: Orthopedics;  Laterality: Left;  . TRIGGER  FINGER RELEASE Right 06/12/2016   Procedure: RIGHT INDEX FINGER TRIGGER RELEASE;  Surgeon: [name], MD;  Location: [facility];  Service: Orthopedics;  Laterality: Right;  . TRIGGER FINGER RELEASE Right 01/19/2018   Procedure: RIGHT RING FINGER TRIGGER FINGER RELEASE;  Surgeon: [name], MD;  Location: [facility];  Service: Orthopedics;  Laterality: Right;  . TRIGGER FINGER RELEASE Left 05/07/2019   Procedure: RELEASE TRIGGER FINGER LEFT INDEX FINGER;  Surgeon: [name], MD;  Location: [place];  Service: Orthopedics;  Laterality: Left;   Social History   Occupational History  . Occupation: disabled    Comment: Disabled  Tobacco Use  . Smoking status: Former Smoker    Packs/day: 0.20    Years: 40.00    Pack years: 8.00    Types: Cigarettes    Start date: 01/28/1977    Quit date: 07/25/2019    Years since quitting: 0.7  . Smokeless tobacco: Never Used  . Tobacco comment: 4-5 cigarettes per day  Vaping Use  . Vaping Use: Never used  Substance and Sexual Activity  . Alcohol use: No    Alcohol/week: 0.0 standard drinks  . Drug use: Not Currently    Types: Marijuana, "Crack" cocaine    Comment: hx marijuana usemany years ago, Crack -none since 11/2015  . Sexual activity: Not Currently    Partners: Male    Birth control/protection: Post-menopausal

## 2020-05-03 ENCOUNTER — Ambulatory Visit: Payer: Medicaid Other | Admitting: Specialist

## 2020-05-04 ENCOUNTER — Encounter: Payer: Self-pay | Admitting: Family Medicine

## 2020-05-04 ENCOUNTER — Other Ambulatory Visit (HOSPITAL_COMMUNITY)
Admission: RE | Admit: 2020-05-04 | Discharge: 2020-05-04 | Disposition: A | Discharge status: Home / Self Care | Payer: 59 | Source: Ambulatory Visit | Attending: General Surgery | Admitting: General Surgery

## 2020-05-04 ENCOUNTER — Other Ambulatory Visit (HOSPITAL_COMMUNITY): Payer: Self-pay

## 2020-05-04 DIAGNOSIS — Z01812 Encounter for preprocedural laboratory examination: Secondary | ICD-10-CM | POA: Diagnosis present

## 2020-05-04 DIAGNOSIS — Z20822 Contact with and (suspected) exposure to covid-19: Secondary | ICD-10-CM | POA: Diagnosis not present

## 2020-05-04 LAB — SARS CORONAVIRUS 2 (TAT 6-24 HRS): SARS Coronavirus 2: NEGATIVE

## 2020-05-04 MED ORDER — INSULIN LISPRO (1 UNIT DIAL) 100 UNIT/ML (KWIKPEN): 18.0000 [IU] | PEN_INJECTOR | Freq: Three times a day (TID) | SUBCUTANEOUS | 3 refills | Status: DC

## 2020-05-04 NOTE — Telephone Encounter (Signed)
Refill sent to patients pharmacy. 

## 2020-05-05 ENCOUNTER — Other Ambulatory Visit: Payer: Self-pay | Admitting: Family Medicine

## 2020-05-05 ENCOUNTER — Encounter: Payer: Self-pay | Admitting: Family Medicine

## 2020-05-05 ENCOUNTER — Telehealth: Payer: Self-pay | Admitting: Family Medicine

## 2020-05-05 ENCOUNTER — Telehealth: Payer: Self-pay

## 2020-05-05 DIAGNOSIS — M25551 Pain in right hip: Secondary | ICD-10-CM

## 2020-05-05 MED ORDER — INSULIN LISPRO (1 UNIT DIAL) 100 UNIT/ML (KWIKPEN): 18.0000 [IU] | PEN_INJECTOR | Freq: Three times a day (TID) | SUBCUTANEOUS | 3 refills | Status: DC

## 2020-05-05 MED ORDER — HYDROCODONE-ACETAMINOPHEN 7.5-325 MG PO TABS: 1.0000 | ORAL_TABLET | Freq: Four times a day (QID) | ORAL | 0 refills | Status: DC | PRN

## 2020-05-05 NOTE — Telephone Encounter (Signed)
The patient was advised of this through a MyChart message from Dr. Junius Roads. She knows to go to [facility] for the xray.

## 2020-05-05 NOTE — Progress Notes (Signed)
Pt aware to arrive at [facility] admitting dept at [facility] for scheduled surgical procedure. Pt aware to consume presurgical drink by 0715 then nothing by mouth.

## 2020-05-05 NOTE — Telephone Encounter (Signed)
Patient called requesting a call back. Patient states she had an injection in right hip and it worked good up until Wednesday. Pain is severe in whole right leg down into her feet and would like medical advice. Patient states she is using her walker. Please call patient at [phone].

## 2020-05-05 NOTE — Telephone Encounter (Signed)
Hydrocodone refill sent.  If still in severe pain after the weekend, we should obtain new x-rays.

## 2020-05-05 NOTE — Telephone Encounter (Signed)
Please advise 

## 2020-05-05 NOTE — Telephone Encounter (Signed)
Medication refill for Humalog did not go through yesterday. I have resent this to patients pharmacy and updated her.

## 2020-05-05 NOTE — Telephone Encounter (Signed)
Humalog prescription is not going through electronically. I called the pharmacy and gave a verbal.

## 2020-05-07 MED ORDER — BUPIVACAINE LIPOSOME 1.3 % IJ SUSP
20.0000 mL | Freq: Once | INTRAMUSCULAR | Status: DC
  Filled 2020-05-07: qty 20

## 2020-05-08 ENCOUNTER — Encounter (HOSPITAL_COMMUNITY): Payer: Self-pay | Admitting: General Surgery

## 2020-05-08 ENCOUNTER — Other Ambulatory Visit: Payer: Self-pay

## 2020-05-08 ENCOUNTER — Inpatient Hospital Stay (HOSPITAL_COMMUNITY)
Admission: RE | Admit: 2020-05-08 | Discharge: 2020-05-10 | DRG: 620 | Disposition: A | Discharge status: Home / Self Care | Payer: 59 | Attending: General Surgery | Admitting: General Surgery

## 2020-05-08 ENCOUNTER — Inpatient Hospital Stay (HOSPITAL_COMMUNITY): Payer: 59 | Admitting: Physician Assistant

## 2020-05-08 ENCOUNTER — Encounter (HOSPITAL_COMMUNITY)
Admission: RE | Disposition: A | Discharge status: Home / Self Care | Payer: Self-pay | Source: Home / Self Care | Attending: General Surgery

## 2020-05-08 ENCOUNTER — Inpatient Hospital Stay (HOSPITAL_COMMUNITY): Payer: 59 | Admitting: Certified Registered Nurse Anesthetist

## 2020-05-08 DIAGNOSIS — N189 Chronic kidney disease, unspecified: Secondary | ICD-10-CM | POA: Diagnosis present

## 2020-05-08 DIAGNOSIS — Z823 Family history of stroke: Secondary | ICD-10-CM

## 2020-05-08 DIAGNOSIS — Z811 Family history of alcohol abuse and dependence: Secondary | ICD-10-CM

## 2020-05-08 DIAGNOSIS — M1990 Unspecified osteoarthritis, unspecified site: Secondary | ICD-10-CM | POA: Diagnosis present

## 2020-05-08 DIAGNOSIS — J449 Chronic obstructive pulmonary disease, unspecified: Secondary | ICD-10-CM | POA: Diagnosis present

## 2020-05-08 DIAGNOSIS — Z794 Long term (current) use of insulin: Secondary | ICD-10-CM

## 2020-05-08 DIAGNOSIS — G4730 Sleep apnea, unspecified: Secondary | ICD-10-CM | POA: Diagnosis present

## 2020-05-08 DIAGNOSIS — E1122 Type 2 diabetes mellitus with diabetic chronic kidney disease: Secondary | ICD-10-CM | POA: Diagnosis present

## 2020-05-08 DIAGNOSIS — Z833 Family history of diabetes mellitus: Secondary | ICD-10-CM | POA: Diagnosis not present

## 2020-05-08 DIAGNOSIS — Z6841 Body Mass Index (BMI) 40.0 and over, adult: Secondary | ICD-10-CM

## 2020-05-08 DIAGNOSIS — Z825 Family history of asthma and other chronic lower respiratory diseases: Secondary | ICD-10-CM | POA: Diagnosis not present

## 2020-05-08 DIAGNOSIS — K219 Gastro-esophageal reflux disease without esophagitis: Secondary | ICD-10-CM | POA: Diagnosis present

## 2020-05-08 DIAGNOSIS — Z8249 Family history of ischemic heart disease and other diseases of the circulatory system: Secondary | ICD-10-CM

## 2020-05-08 DIAGNOSIS — I509 Heart failure, unspecified: Secondary | ICD-10-CM | POA: Diagnosis present

## 2020-05-08 DIAGNOSIS — Z832 Family history of diseases of the blood and blood-forming organs and certain disorders involving the immune mechanism: Secondary | ICD-10-CM

## 2020-05-08 DIAGNOSIS — F1020 Alcohol dependence, uncomplicated: Secondary | ICD-10-CM | POA: Diagnosis present

## 2020-05-08 DIAGNOSIS — F32A Depression, unspecified: Secondary | ICD-10-CM | POA: Diagnosis present

## 2020-05-08 DIAGNOSIS — F419 Anxiety disorder, unspecified: Secondary | ICD-10-CM | POA: Diagnosis present

## 2020-05-08 DIAGNOSIS — I130 Hypertensive heart and chronic kidney disease with heart failure and stage 1 through stage 4 chronic kidney disease, or unspecified chronic kidney disease: Secondary | ICD-10-CM | POA: Diagnosis present

## 2020-05-08 DIAGNOSIS — F319 Bipolar disorder, unspecified: Secondary | ICD-10-CM | POA: Diagnosis present

## 2020-05-08 DIAGNOSIS — N183 Chronic kidney disease, stage 3 unspecified: Secondary | ICD-10-CM | POA: Diagnosis present

## 2020-05-08 DIAGNOSIS — Z881 Allergy status to other antibiotic agents status: Secondary | ICD-10-CM

## 2020-05-08 DIAGNOSIS — Z8261 Family history of arthritis: Secondary | ICD-10-CM | POA: Diagnosis not present

## 2020-05-08 DIAGNOSIS — Z803 Family history of malignant neoplasm of breast: Secondary | ICD-10-CM

## 2020-05-08 DIAGNOSIS — Z8541 Personal history of malignant neoplasm of cervix uteri: Secondary | ICD-10-CM

## 2020-05-08 DIAGNOSIS — Z886 Allergy status to analgesic agent status: Secondary | ICD-10-CM

## 2020-05-08 DIAGNOSIS — Z79899 Other long term (current) drug therapy: Secondary | ICD-10-CM

## 2020-05-08 DIAGNOSIS — I517 Cardiomegaly: Secondary | ICD-10-CM | POA: Diagnosis present

## 2020-05-08 DIAGNOSIS — Z888 Allergy status to other drugs, medicaments and biological substances status: Secondary | ICD-10-CM

## 2020-05-08 DIAGNOSIS — E785 Hyperlipidemia, unspecified: Secondary | ICD-10-CM | POA: Diagnosis present

## 2020-05-08 DIAGNOSIS — Z981 Arthrodesis status: Secondary | ICD-10-CM

## 2020-05-08 DIAGNOSIS — Z87891 Personal history of nicotine dependence: Secondary | ICD-10-CM

## 2020-05-08 DIAGNOSIS — Z818 Family history of other mental and behavioral disorders: Secondary | ICD-10-CM

## 2020-05-08 HISTORY — PX: LAPAROSCOPIC GASTRIC SLEEVE RESECTION: SHX5895

## 2020-05-08 HISTORY — PX: UPPER GI ENDOSCOPY: SHX6162

## 2020-05-08 LAB — HEMOGLOBIN AND HEMATOCRIT, BLOOD
HCT: 49.8 % — ABNORMAL HIGH (ref 36.0–46.0)
Hemoglobin: 17.1 g/dL — ABNORMAL HIGH (ref 12.0–15.0)

## 2020-05-08 LAB — TYPE AND SCREEN
ABO/RH(D): O POS
Antibody Screen: NEGATIVE

## 2020-05-08 LAB — GLUCOSE, CAPILLARY
Glucose-Capillary: 148 mg/dL — ABNORMAL HIGH (ref 70–99)
Glucose-Capillary: 216 mg/dL — ABNORMAL HIGH (ref 70–99)
Glucose-Capillary: 224 mg/dL — ABNORMAL HIGH (ref 70–99)
Glucose-Capillary: 198 mg/dL — ABNORMAL HIGH (ref 70–99)

## 2020-05-08 SURGERY — GASTRECTOMY, SLEEVE, LAPAROSCOPIC
Anesthesia: General | Site: Esophagus

## 2020-05-08 MED ORDER — ALBUTEROL SULFATE HFA 108 (90 BASE) MCG/ACT IN AERS
2.0000 | INHALATION_SPRAY | Freq: Four times a day (QID) | RESPIRATORY_TRACT | Status: DC | PRN
  Filled 2020-05-08: qty 6.7

## 2020-05-08 MED ORDER — FLUTICASONE-UMECLIDIN-VILANT 100-62.5-25 MCG/INH IN AEPB: 1.0000 | INHALATION_SPRAY | Freq: Every day | RESPIRATORY_TRACT | Status: DC

## 2020-05-08 MED ORDER — ENOXAPARIN SODIUM 30 MG/0.3ML ~~LOC~~ SOLN
30.0000 mg | Freq: Two times a day (BID) | SUBCUTANEOUS | Status: DC
  Administered 2020-05-08 – 2020-05-09 (×3): 30 mg via SUBCUTANEOUS
  Filled 2020-05-08 (×3): qty 0.3

## 2020-05-08 MED ORDER — ROCURONIUM BROMIDE 10 MG/ML (PF) SYRINGE
PREFILLED_SYRINGE | INTRAVENOUS | Status: AC
  Filled 2020-05-08: qty 10

## 2020-05-08 MED ORDER — ACETAMINOPHEN 500 MG PO TABS
1000.0000 mg | ORAL_TABLET | ORAL | Status: AC
  Administered 2020-05-08: 1000 mg via ORAL
  Filled 2020-05-08: qty 2

## 2020-05-08 MED ORDER — MIDAZOLAM HCL 2 MG/2ML IJ SOLN
INTRAMUSCULAR | Status: AC
  Filled 2020-05-08: qty 2

## 2020-05-08 MED ORDER — GABAPENTIN 300 MG PO CAPS
900.0000 mg | ORAL_CAPSULE | Freq: Every day | ORAL | Status: DC
  Administered 2020-05-08 – 2020-05-09 (×2): 900 mg via ORAL
  Filled 2020-05-08 (×2): qty 3

## 2020-05-08 MED ORDER — PROPOFOL 10 MG/ML IV BOLUS
INTRAVENOUS | Status: DC | PRN
  Administered 2020-05-08: 50 mg via INTRAVENOUS
  Administered 2020-05-08: 150 mg via INTRAVENOUS

## 2020-05-08 MED ORDER — GABAPENTIN 300 MG PO CAPS
300.0000 mg | ORAL_CAPSULE | ORAL | Status: DC
  Filled 2020-05-08: qty 1

## 2020-05-08 MED ORDER — APREPITANT 40 MG PO CAPS
40.0000 mg | ORAL_CAPSULE | ORAL | Status: AC
  Administered 2020-05-08: 40 mg via ORAL
  Filled 2020-05-08: qty 1

## 2020-05-08 MED ORDER — STERILE WATER FOR IRRIGATION IR SOLN
Status: DC | PRN
  Administered 2020-05-08: 2000 mL

## 2020-05-08 MED ORDER — CHLORHEXIDINE GLUCONATE 0.12 % MT SOLN
15.0000 mL | Freq: Once | OROMUCOSAL | Status: AC
  Administered 2020-05-08: 15 mL via OROMUCOSAL

## 2020-05-08 MED ORDER — GABAPENTIN 300 MG PO CAPS: 600.0000 mg | ORAL_CAPSULE | Freq: Three times a day (TID) | ORAL | Status: DC

## 2020-05-08 MED ORDER — BACLOFEN 10 MG PO TABS
10.0000 mg | ORAL_TABLET | Freq: Three times a day (TID) | ORAL | Status: DC | PRN
  Administered 2020-05-08 – 2020-05-09 (×2): 10 mg via ORAL
  Filled 2020-05-08 (×2): qty 1

## 2020-05-08 MED ORDER — GABAPENTIN 300 MG PO CAPS
600.0000 mg | ORAL_CAPSULE | Freq: Two times a day (BID) | ORAL | Status: DC
  Administered 2020-05-09 – 2020-05-10 (×3): 600 mg via ORAL
  Filled 2020-05-08 (×3): qty 2

## 2020-05-08 MED ORDER — SUGAMMADEX SODIUM 500 MG/5ML IV SOLN
INTRAVENOUS | Status: DC | PRN
  Administered 2020-05-08: 315.8 mg via INTRAVENOUS

## 2020-05-08 MED ORDER — ROCURONIUM BROMIDE 10 MG/ML (PF) SYRINGE
PREFILLED_SYRINGE | INTRAVENOUS | Status: DC | PRN
  Administered 2020-05-08: 60 mg via INTRAVENOUS

## 2020-05-08 MED ORDER — UMECLIDINIUM BROMIDE 62.5 MCG/INH IN AEPB
1.0000 | INHALATION_SPRAY | Freq: Every day | RESPIRATORY_TRACT | Status: DC
  Administered 2020-05-09 – 2020-05-10 (×2): 1 via RESPIRATORY_TRACT
  Filled 2020-05-08: qty 7

## 2020-05-08 MED ORDER — HEPARIN SODIUM (PORCINE) 5000 UNIT/ML IJ SOLN
5000.0000 [IU] | INTRAMUSCULAR | Status: AC
  Administered 2020-05-08: 5000 [IU] via SUBCUTANEOUS
  Filled 2020-05-08: qty 1

## 2020-05-08 MED ORDER — BUPIVACAINE LIPOSOME 1.3 % IJ SUSP
INTRAMUSCULAR | Status: DC | PRN
  Administered 2020-05-08: 20 mL

## 2020-05-08 MED ORDER — FENTANYL CITRATE (PF) 100 MCG/2ML IJ SOLN: 25.0000 ug | INTRAMUSCULAR | Status: DC | PRN

## 2020-05-08 MED ORDER — PHENYLEPHRINE HCL (PRESSORS) 10 MG/ML IV SOLN
INTRAVENOUS | Status: AC
  Filled 2020-05-08: qty 1

## 2020-05-08 MED ORDER — LIDOCAINE 2% (20 MG/ML) 5 ML SYRINGE
INTRAMUSCULAR | Status: DC | PRN
  Administered 2020-05-08: 1.5 mg/kg/h via INTRAVENOUS

## 2020-05-08 MED ORDER — HYDROMORPHONE HCL 1 MG/ML IJ SOLN
INTRAMUSCULAR | Status: AC
  Administered 2020-05-08: 0.5 mg via INTRAVENOUS
  Filled 2020-05-08: qty 1

## 2020-05-08 MED ORDER — FENTANYL CITRATE (PF) 100 MCG/2ML IJ SOLN
25.0000 ug | INTRAMUSCULAR | Status: DC | PRN
  Administered 2020-05-08 (×2): 50 ug via INTRAVENOUS

## 2020-05-08 MED ORDER — HYDRALAZINE HCL 25 MG PO TABS: 25.0000 mg | ORAL_TABLET | Freq: Three times a day (TID) | ORAL | Status: DC | PRN

## 2020-05-08 MED ORDER — BUPIVACAINE HCL 0.25 % IJ SOLN
INTRAMUSCULAR | Status: AC
  Filled 2020-05-08: qty 1

## 2020-05-08 MED ORDER — FLUTICASONE FUROATE-VILANTEROL 100-25 MCG/INH IN AEPB
1.0000 | INHALATION_SPRAY | Freq: Every day | RESPIRATORY_TRACT | Status: DC
  Administered 2020-05-09 – 2020-05-10 (×2): 1 via RESPIRATORY_TRACT
  Filled 2020-05-08: qty 28

## 2020-05-08 MED ORDER — ONDANSETRON HCL 4 MG/2ML IJ SOLN: 4.0000 mg | INTRAMUSCULAR | Status: DC | PRN

## 2020-05-08 MED ORDER — SCOPOLAMINE 1 MG/3DAYS TD PT72
1.0000 | MEDICATED_PATCH | TRANSDERMAL | Status: DC
  Administered 2020-05-08: 1.5 mg via TRANSDERMAL
  Filled 2020-05-08: qty 1

## 2020-05-08 MED ORDER — ACETAMINOPHEN 500 MG PO TABS
1000.0000 mg | ORAL_TABLET | Freq: Three times a day (TID) | ORAL | Status: DC
  Administered 2020-05-08 – 2020-05-10 (×6): 1000 mg via ORAL
  Filled 2020-05-08 (×6): qty 2

## 2020-05-08 MED ORDER — CHLORHEXIDINE GLUCONATE CLOTH 2 % EX PADS: 6.0000 | MEDICATED_PAD | Freq: Once | CUTANEOUS | Status: DC

## 2020-05-08 MED ORDER — HYDROMORPHONE HCL 1 MG/ML IJ SOLN
0.5000 mg | INTRAMUSCULAR | Status: AC | PRN
  Administered 2020-05-08 (×2): 0.5 mg via INTRAVENOUS

## 2020-05-08 MED ORDER — ENSURE MAX PROTEIN PO LIQD
2.0000 [oz_av] | ORAL | Status: DC
  Administered 2020-05-09 – 2020-05-10 (×7): 2 [oz_av] via ORAL

## 2020-05-08 MED ORDER — DILTIAZEM HCL ER COATED BEADS 120 MG PO CP24
120.0000 mg | ORAL_CAPSULE | Freq: Two times a day (BID) | ORAL | Status: DC
  Administered 2020-05-08 – 2020-05-09 (×3): 120 mg via ORAL
  Filled 2020-05-08 (×3): qty 1

## 2020-05-08 MED ORDER — BUSPIRONE HCL 5 MG PO TABS
10.0000 mg | ORAL_TABLET | Freq: Three times a day (TID) | ORAL | Status: DC
  Administered 2020-05-08 – 2020-05-09 (×5): 10 mg via ORAL
  Filled 2020-05-08 (×5): qty 2

## 2020-05-08 MED ORDER — ACETAMINOPHEN 160 MG/5ML PO SOLN: 1000.0000 mg | Freq: Three times a day (TID) | ORAL | Status: DC

## 2020-05-08 MED ORDER — ONDANSETRON HCL 4 MG/2ML IJ SOLN
INTRAMUSCULAR | Status: DC | PRN
  Administered 2020-05-08: 4 mg via INTRAVENOUS

## 2020-05-08 MED ORDER — FENTANYL CITRATE (PF) 100 MCG/2ML IJ SOLN
INTRAMUSCULAR | Status: AC
  Administered 2020-05-08: 50 ug via INTRAVENOUS
  Filled 2020-05-08: qty 4

## 2020-05-08 MED ORDER — ALLOPURINOL 300 MG PO TABS
300.0000 mg | ORAL_TABLET | Freq: Every morning | ORAL | Status: DC
  Administered 2020-05-09 – 2020-05-10 (×2): 300 mg via ORAL
  Filled 2020-05-08 (×2): qty 1

## 2020-05-08 MED ORDER — FENTANYL CITRATE (PF) 250 MCG/5ML IJ SOLN
INTRAMUSCULAR | Status: AC
  Filled 2020-05-08: qty 5

## 2020-05-08 MED ORDER — MORPHINE SULFATE (PF) 2 MG/ML IV SOLN
1.0000 mg | INTRAVENOUS | Status: DC | PRN
Start: 2020-05-08 — End: 2020-05-10
  Administered 2020-05-09: 2 mg via INTRAVENOUS
  Filled 2020-05-08: qty 1

## 2020-05-08 MED ORDER — LIDOCAINE 2% (20 MG/ML) 5 ML SYRINGE
INTRAMUSCULAR | Status: AC
  Filled 2020-05-08: qty 5

## 2020-05-08 MED ORDER — SODIUM CHLORIDE 0.9 % IV SOLN
2.0000 g | INTRAVENOUS | Status: AC
  Administered 2020-05-08: 2 g via INTRAVENOUS
  Filled 2020-05-08: qty 2

## 2020-05-08 MED ORDER — INSULIN ASPART 100 UNIT/ML ~~LOC~~ SOLN
0.0000 [IU] | SUBCUTANEOUS | Status: DC
  Administered 2020-05-08: 4 [IU] via SUBCUTANEOUS
  Administered 2020-05-08: 7 [IU] via SUBCUTANEOUS
  Administered 2020-05-09 (×4): 4 [IU] via SUBCUTANEOUS
  Administered 2020-05-09: 3 [IU] via SUBCUTANEOUS
  Administered 2020-05-09 – 2020-05-10 (×2): 4 [IU] via SUBCUTANEOUS
  Administered 2020-05-10: 3 [IU] via SUBCUTANEOUS
  Administered 2020-05-10: 4 [IU] via SUBCUTANEOUS

## 2020-05-08 MED ORDER — SUCCINYLCHOLINE CHLORIDE 200 MG/10ML IV SOSY
PREFILLED_SYRINGE | INTRAVENOUS | Status: DC | PRN
  Administered 2020-05-08: 130 mg via INTRAVENOUS

## 2020-05-08 MED ORDER — ONDANSETRON HCL 4 MG/2ML IJ SOLN
INTRAMUSCULAR | Status: AC
  Filled 2020-05-08: qty 2

## 2020-05-08 MED ORDER — LACTATED RINGERS IR SOLN
Status: DC | PRN
  Administered 2020-05-08: 1000 mL

## 2020-05-08 MED ORDER — FENTANYL CITRATE (PF) 100 MCG/2ML IJ SOLN
INTRAMUSCULAR | Status: DC | PRN
  Administered 2020-05-08: 100 ug via INTRAVENOUS
  Administered 2020-05-08: 50 ug via INTRAVENOUS

## 2020-05-08 MED ORDER — LACTATED RINGERS IV SOLN: INTRAVENOUS | Status: DC

## 2020-05-08 MED ORDER — SIMETHICONE 80 MG PO CHEW: 80.0000 mg | CHEWABLE_TABLET | Freq: Four times a day (QID) | ORAL | Status: DC | PRN

## 2020-05-08 MED ORDER — DEXAMETHASONE SODIUM PHOSPHATE 10 MG/ML IJ SOLN
INTRAMUSCULAR | Status: AC
  Filled 2020-05-08: qty 1

## 2020-05-08 MED ORDER — OXYCODONE HCL 5 MG/5ML PO SOLN
5.0000 mg | Freq: Four times a day (QID) | ORAL | Status: DC | PRN
  Administered 2020-05-08 – 2020-05-10 (×6): 5 mg via ORAL
  Filled 2020-05-08 (×6): qty 5

## 2020-05-08 MED ORDER — PHENYLEPHRINE HCL-NACL 10-0.9 MG/250ML-% IV SOLN
INTRAVENOUS | Status: DC | PRN
  Administered 2020-05-08: 40 ug/min via INTRAVENOUS

## 2020-05-08 MED ORDER — FAMOTIDINE IN NACL 20-0.9 MG/50ML-% IV SOLN
20.0000 mg | Freq: Two times a day (BID) | INTRAVENOUS | Status: DC
  Administered 2020-05-08 – 2020-05-09 (×3): 20 mg via INTRAVENOUS
  Filled 2020-05-08 (×3): qty 50

## 2020-05-08 MED ORDER — BUPIVACAINE HCL 0.25 % IJ SOLN
INTRAMUSCULAR | Status: DC | PRN
  Administered 2020-05-08: 30 mL

## 2020-05-08 MED ORDER — MIDAZOLAM HCL 5 MG/5ML IJ SOLN
INTRAMUSCULAR | Status: DC | PRN
  Administered 2020-05-08: 2 mg via INTRAVENOUS

## 2020-05-08 MED ORDER — DEXTROSE-NACL 5-0.45 % IV SOLN: INTRAVENOUS | Status: DC

## 2020-05-08 MED ORDER — SUCCINYLCHOLINE CHLORIDE 200 MG/10ML IV SOSY
PREFILLED_SYRINGE | INTRAVENOUS | Status: AC
  Filled 2020-05-08: qty 10

## 2020-05-08 MED ORDER — DEXAMETHASONE SODIUM PHOSPHATE 4 MG/ML IJ SOLN
4.0000 mg | INTRAMUSCULAR | Status: AC
  Administered 2020-05-08: 4 mg via INTRAVENOUS

## 2020-05-08 MED ORDER — ORAL CARE MOUTH RINSE: 15.0000 mL | Freq: Once | OROMUCOSAL | Status: AC

## 2020-05-08 MED ORDER — 0.9 % SODIUM CHLORIDE (POUR BTL) OPTIME
TOPICAL | Status: DC | PRN
  Administered 2020-05-08: 1000 mL

## 2020-05-08 MED ORDER — LIDOCAINE 2% (20 MG/ML) 5 ML SYRINGE
INTRAMUSCULAR | Status: DC | PRN
  Administered 2020-05-08: 60 mg via INTRAVENOUS

## 2020-05-08 SURGICAL SUPPLY — 60 items
APL PRP STRL LF DISP 70% ISPRP (MISCELLANEOUS) ×2
APL SKNCLS STERI-STRIP NONHPOA (GAUZE/BANDAGES/DRESSINGS) ×2
APPLIER CLIP ROT 13.4 12 LRG (CLIP) ×3
APR CLP LRG 13.4X12 ROT 20 MLT (CLIP) ×2
BAG LAPAROSCOPIC 12 15 PORT 16 (BASKET) ×2 IMPLANT
BAG RETRIEVAL 12/15 (BASKET) ×3
BENZOIN TINCTURE PRP APPL 2/3 (GAUZE/BANDAGES/DRESSINGS) ×3 IMPLANT
BLADE SURG SZ11 CARB STEEL (BLADE) ×3 IMPLANT
BNDG ADH 1X3 SHEER STRL LF (GAUZE/BANDAGES/DRESSINGS) ×8 IMPLANT
BNDG ADH THN 3X1 STRL LF (GAUZE/BANDAGES/DRESSINGS) ×2
CABLE HIGH FREQUENCY MONO STRZ (ELECTRODE) IMPLANT
CHLORAPREP W/TINT 26 (MISCELLANEOUS) ×3 IMPLANT
CLIP APPLIE ROT 13.4 12 LRG (CLIP) ×1 IMPLANT
COVER SURGICAL LIGHT HANDLE (MISCELLANEOUS) ×3 IMPLANT
COVER WAND RF STERILE (DRAPES) IMPLANT
DECANTER SPIKE VIAL GLASS SM (MISCELLANEOUS) ×3 IMPLANT
DRAPE UTILITY XL STRL (DRAPES) ×6 IMPLANT
ELECT REM PT RETURN 15FT ADLT (MISCELLANEOUS) ×3 IMPLANT
GAUZE 4X4 16PLY RFD (DISPOSABLE) ×3 IMPLANT
GLOVE SURG POLYISO LF SZ7 (GLOVE) ×3 IMPLANT
GLOVE SURG UNDER POLY LF SZ7 (GLOVE) ×3 IMPLANT
GOWN STRL REUS W/TWL LRG LVL3 (GOWN DISPOSABLE) ×3 IMPLANT
GOWN STRL REUS W/TWL XL LVL3 (GOWN DISPOSABLE) ×9 IMPLANT
GRASPER SUT TROCAR 14GX15 (MISCELLANEOUS) ×3 IMPLANT
KIT BASIN OR (CUSTOM PROCEDURE TRAY) ×3 IMPLANT
KIT TURNOVER KIT A (KITS) ×3 IMPLANT
MARKER SKIN DUAL TIP RULER LAB (MISCELLANEOUS) ×3 IMPLANT
MAT PREVALON FULL STRYKER (MISCELLANEOUS) IMPLANT
NDL SPNL 22GX3.5 QUINCKE BK (NEEDLE) ×1 IMPLANT
NEEDLE SPNL 22GX3.5 QUINCKE BK (NEEDLE) ×3 IMPLANT
PACK UNIVERSAL I (CUSTOM PROCEDURE TRAY) ×3 IMPLANT
RELOAD STAPLE 60 3.6 BLU REG (STAPLE) IMPLANT
RELOAD STAPLE 60 3.8 GOLD REG (STAPLE) IMPLANT
RELOAD STAPLE 60 4.1 GRN THCK (STAPLE) IMPLANT
RELOAD STAPLER BLUE 60MM (STAPLE) ×8 IMPLANT
RELOAD STAPLER GOLD 60MM (STAPLE) ×2 IMPLANT
RELOAD STAPLER GREEN 60MM (STAPLE) ×2 IMPLANT
SCISSORS LAP 5X45 EPIX DISP (ENDOMECHANICALS) IMPLANT
SET IRRIG TUBING LAPAROSCOPIC (IRRIGATION / IRRIGATOR) ×3 IMPLANT
SET TUBE SMOKE EVAC HIGH FLOW (TUBING) ×3 IMPLANT
SHEARS HARMONIC ACE PLUS 45CM (MISCELLANEOUS) ×3 IMPLANT
SLEEVE GASTRECTOMY 40FR VISIGI (MISCELLANEOUS) ×3 IMPLANT
SLEEVE XCEL OPT CAN 5 100 (ENDOMECHANICALS) ×6 IMPLANT
SOL ANTI FOG 6CC (MISCELLANEOUS) ×2 IMPLANT
SOLUTION ANTI FOG 6CC (MISCELLANEOUS) ×1
STAPLER ECHELON LONG 60 440 (INSTRUMENTS) ×3 IMPLANT
STAPLER RELOAD BLUE 60MM (STAPLE) ×12
STAPLER RELOAD GOLD 60MM (STAPLE) ×3
STAPLER RELOAD GREEN 60MM (STAPLE) ×3
STRIP CLOSURE SKIN 1/2X4 (GAUZE/BANDAGES/DRESSINGS) ×3 IMPLANT
SUT ETHIBOND 0 36 GRN (SUTURE) IMPLANT
SUT MNCRL AB 4-0 PS2 18 (SUTURE) ×3 IMPLANT
SUT VICRYL 0 TIES 12 18 (SUTURE) ×3 IMPLANT
SYR 20ML LL LF (SYRINGE) ×3 IMPLANT
SYR 50ML LL SCALE MARK (SYRINGE) ×3 IMPLANT
TOWEL OR 17X26 10 PK STRL BLUE (TOWEL DISPOSABLE) ×3 IMPLANT
TOWEL OR NON WOVEN STRL DISP B (DISPOSABLE) ×3 IMPLANT
TROCAR BLADELESS 15MM (ENDOMECHANICALS) ×3 IMPLANT
TROCAR BLADELESS OPT 5 100 (ENDOMECHANICALS) ×3 IMPLANT
TUBING CONNECTING 10 (TUBING) ×6 IMPLANT

## 2020-05-08 NOTE — Op Note (Signed)
Preop Diagnosis: Obesity Class III  Postop Diagnosis: same  Procedure performed: laparoscopic Sleeve Gastrectomy  Assitant: Kaylyn Lim  Indications:  The patient is a 55 y.o. year-old morbidly obese female who has been followed in the Bariatric Clinic as an outpatient. This patient was diagnosed with morbid obesity with a BMI of Body mass index is 51.39 kg/m. and significant co-morbidities including non-insulin dependent diabetes and congestive heart failure.  The patient was counseled extensively in the Bariatric Outpatient Clinic and after a thorough explanation of the risks and benefits of surgery (including death from complications, bowel leak, infection such as peritonitis and/or sepsis, internal hernia, bleeding, need for blood transfusion, bowel obstruction, organ failure, pulmonary embolus, deep venous thrombosis, wound infection, incisional hernia, skin breakdown, and others entailed on the consent form) and after a compliant diet and exercise program, the patient was scheduled for an elective laparoscopic sleeve gastrectomy.  Description of Operation:  Following informed consent, the patient was taken to the operating room and placed on the operating table in the supine position.  She had previously received prophylactic antibiotics and subcutaneous heparin for DVT prophylaxis in the pre-op holding area.  After induction of general endotracheal anesthesia by the anesthesiologist, the patient underwent placement of sequential compression devices and an oro-gastric tube.  A timeout was confirmed by the surgery and anesthesia teams.  The patient was adequately padded at all pressure points and placed on a footboard to prevent slippage from the OR table during extremes of position during surgery.  She underwent a routine sterile prep and drape of her entire abdomen.    Next, A transverse incision was made under the left subcostal area and a 38mm optical viewing trocar was introduced into the  peritoneal cavity. Pneumoperitoneum was applied with a high flow and low pressure. A laparoscope was inserted to confirm placement. A extraperitoneal block was then placed at the lateral abdominal wall using exparel diluted with marcaine. 5 additional incisions were placed: 1 35mm trocar to the left of the midline. 1 additional 66mm trocar in the left lateral area, 1 52mm trocar in the right mid abdomen, 1 68mm trocar in the right subcostal area, and a Nathanson retractor was placed through a subxiphoid incision.  Next, a hole was created through the lesser omentum along the greater curve of the stomach to enter the lesser sac. The vessels along the greater omentum were  Then ligated and divided using the Harmonic scalpel moving towards the spleen and then short gastric vessels were ligated and divided in the same fashion to fully mobilize the fundus. The left crus was identified to ensure completion of the dissection. Next the antrum was measured and dissection continued inferiorly along the greater curve towards the pylorus and stopped 6cm from the pylorus.   A 40Fr ViSiGi dilator was placed into the esophgaus and along the lesser curve of the stomach and placed on suction. 1 non-reinforced 13mm Green load echelon stapler(s) followed by 1 55mm Gold load echelon stapler(s) followed by 3 15mm blue load echelon stapler(s) were used to make the resection along the antrum being sure to stay well away from the angularis by angling the jaws of the stapler towards the greater curve and later completing the resection staying along the Steele and ensuring the fundus was not retained by appropriately retracting it lateral. Air was inserted through the [place] to perform a leak test showing no bubbles and a neutral lie of the stomach.  The assistant then went and performed an upper endoscopy  and leak test. No bubbles were seen and the sleeve and antrum distended appropriately. The specimen was then placed in an endocatch  bag and removed by the 3mm port. Multiple clips were placed over the first 2 staple lines for bleeding. The fascia of the 28mm port was closed with a 0 vicryl by suture passer. Hemostasis was ensured. Pneumoperitoneum was evacuated, all ports were removed and all incisions closed with 4-0 monocryl suture in subcuticular fashion. Steristrips and bandaids were put in place for dressing. The patient awoke from anesthesia and was brought to pacu in stable condition. All counts were correct.  Estimated blood loss: <89ml  Specimens:  Sleeve gastrectomy  Local Anesthesia: 50 ml Exparel:0.5% Marcaine mix  Post-Op Plan:       Pain Management: PO, prn      Antibiotics: Prophylactic      Anticoagulation: Prophylactic, Starting now      Post Op Studies/Consults: Not applicable      Intended Discharge: within 48h      Intended Outpatient Follow-Up: Two Week      Intended Outpatient Studies: Not Applicable      Other: Not Applicable  Images:    Arta Bruce Reeve Mallo

## 2020-05-08 NOTE — Progress Notes (Addendum)
PHARMACY CONSULT FOR:  Risk Assessment for Post-Discharge VTE Following Bariatric Surgery  Post-Discharge VTE Risk Assessment: This patient's probability of 30-day post-discharge VTE is increased due to the factors marked:   Female    Age >/=60 years  X  BMI >/=50 kg/m2    CHF    Dyspnea at Rest    Paraplegia  X  Non-gastric-band surgery    Operation Time >/=3 hr    Return to OR     Length of Stay >/= 3 d   Hx of VTE   Hypercoagulable condition   Significant venous stasis     Predicted probability of 30-day post-discharge VTE: 0.27%  Other patient-specific factors to consider: hx CHF, ECHO 10/01/2017 EF 55-60%; on furosemide 80 bid, losartan 100 qday; metolazone 2.5 on M/Th only. No new or progressive symptoms in the last 30 days.  Recommendation for Discharge: No pharmacologic prophylaxis post-discharge  [name] is a 55 y.o. female who underwent laparoscopic sleeve gastrectomy 05/08/2020   Allergies  Allergen Reactions  . Aspirin Other (See Comments)    Kidney disease  . 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 Corticosteroids Other (See Comments)    Hyperglycemia   . Levofloxacin Itching    Patient Measurements: Height: '5\' 9"'  (175.3 cm) Weight: (!) 157.9 kg (348 lb) IBW/kg (Calculated) : 66.2 Body mass index is 51.39 kg/m.  No results for input(s): WBC, HGB, HCT, PLT, APTT, CREATININE, LABCREA, CREATININE, CREAT24HRUR, MG, PHOS, ALBUMIN, PROT, ALBUMIN, AST, ALT, ALKPHOS, BILITOT, BILIDIR, IBILI in the last 72 hours. Estimated Creatinine Clearance: 66.7 mL/min (A) (by C-G formula based on SCr of 1.53 mg/dL (H)).    Past Medical History:  Diagnosis Date  . Alcoholism ([place])   . Anxiety   . Arthritis 04-10-11   hips, shoulders, back  . Asthma   . Bipolar disorder ([place])   . Cardiomegaly   . Carpal tunnel syndrome, bilateral   . Cervical cancer ([place]) 1993   cervical, no treatment done, went away per pt  . Cervical dysplasia or  atypia 04-10-11   '93- once dx.-got pregnant-no intervention, then postpartum, no dysplasia found  . CHF (congestive heart failure) ([place])    no cardiologist 2014 dx, none now  . Chronic hypoxemic respiratory failure ([place])   . Chronic kidney disease    stage 3 kidney disease  . 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 Pathology showed Condyloma, gluteal CONDYLOMA ACUMINATUM.   [name] Kitchen COPD (chronic obstructive pulmonary disease) ([place])   . Depression   . Diabetes mellitus without complication ([place])   . Dyspnea    with actity, sitting  . Fall 12/16/2016  . Fibrocystic breast disease   . GERD (gastroesophageal reflux disease)   . Grade I diastolic dysfunction 17/00/1749   Noted on ECHO  . Hepatitis    hep B-count is low at present,doesn't register  . History of PSVT (paroxysmal supraventricular tachycardia)   . Hypercalcemia   . Hyperlipidemia   . Hypertension   . Incomplete left bundle branch block (LBBB) 09/24/2018   Noted on EKG  . Lung nodule 11/2017   right and left lung  . Migraine headache    none recent  . Morbid obesity ([place]) 03/27/2006  . Neuropathy   . Pneumonia    walking pneu 15 yrs. ago  . Skin lesion 03/15/2011   In gluteal crease now s/p removal by Dr. [name] of General Surgery on 3/12. Path shows  condyloma.     . Sleep apnea 04-10-11   uses cpap, pt does not know settings  . Trigger finger    left third  . Urge incontinence of urine      Medications Prior to Admission  Medication Sig Dispense Refill Last Dose  . acetaminophen (TYLENOL) 500 MG tablet Take 1,000 mg by mouth 2 (two) times daily.   05/07/2020 at 2100  . albuterol (VENTOLIN HFA) 108 (90 Base) MCG/ACT inhaler Inhale 2 puffs into the lungs every 6 (six) hours as needed for wheezing or shortness of breath. 18 g 3 Past Month at Unknown time  . allopurinol (ZYLOPRIM) 300 MG tablet Take 300 mg by mouth in the morning.   05/08/2020 at 0600  . ammonium lactate (LAC-HYDRIN) 12 % lotion  APPLY TWICE A DAY AS NEEDED FOR DRY SKIN (Patient taking differently: Apply 1 application topically in the morning and at bedtime.) 400 mL 3 05/07/2020 at 2200  . atorvastatin (LIPITOR) 40 MG tablet TAKE 1 TABLET BY MOUTH EVERY DAY (Patient taking differently: Take 40 mg by mouth in the morning.) 90 tablet 3 05/07/2020 at 0700  . b complex vitamins capsule Take 1 capsule by mouth daily.   05/07/2020 at 0700  . baclofen (LIORESAL) 10 MG tablet Take 1 tablet (10 mg total) by mouth 3 (three) times daily as needed for muscle spasms. (Patient taking differently: Take 10 mg by mouth 3 (three) times daily.) 90 each 0 05/07/2020 at 2100  . busPIRone (BUSPAR) 10 MG tablet Take 1 tablet (10 mg total) by mouth 3 (three) times daily. 270 tablet 4 05/08/2020 at 0600  . diclofenac Sodium (VOLTAREN) 1 % GEL APPLY 2 GRAMS TO AFFECTED AREA 4 TIMES A DAY 500 g 1 05/07/2020 at 2300  . diltiazem (CARDIZEM CD) 120 MG 24 hr capsule TAKE 1 CAPSULE (120 MG TOTAL) BY MOUTH 2 (TWO) TIMES DAILY. 180 capsule 2 05/08/2020 at 0600  . docusate sodium (COLACE) 100 MG capsule Take 200 mg by mouth 2 (two) times daily.   05/07/2020 at 2100  . empagliflozin (JARDIANCE) 25 MG TABS tablet Take 1 tablet (25 mg total) by mouth daily. (Patient taking differently: Take 25 mg by mouth in the morning.) 90 tablet 3 05/07/2020 at 0700  . febuxostat (ULORIC) 40 MG tablet Take 40 mg by mouth daily.   05/07/2020 at 0700  . fluticasone (FLONASE) 50 MCG/ACT nasal spray Place 2 sprays into both nostrils 2 (two) times daily. 16 g 6 05/08/2020 at 0600  . Fluticasone-Umeclidin-Vilant (TRELEGY ELLIPTA) 100-62.5-25 MCG/INH AEPB Inhale 1 puff into the lungs daily. 180 each 3 05/08/2020 at 0600  . furosemide (LASIX) 40 MG tablet TAKE 2 TABLETS (80 MG TOTAL) BY MOUTH 2 (TWO) TIMES DAILY. (Patient taking differently: Take 80 mg by mouth 2 (two) times daily. Morning & lunch) 360 tablet 1  at 0700  . gabapentin (NEURONTIN) 300 MG capsule Take 2 capsules (600 mg total) by mouth  3 (three) times daily. TAKE 2 CAPSULES (600 MG TOTAL) BY MOUTH in the morning and at lunch and take 3 capsules at night (Patient taking differently: Take 600 mg by mouth 3 (three) times daily.) 540 capsule 2 05/08/2020 at 0600  . HYDROcodone-acetaminophen (NORCO) 7.5-325 MG tablet Take 1 tablet by mouth every 6 (six) hours as needed for moderate pain. 20 tablet 0 05/07/2020 at 2100  . hydrOXYzine (ATARAX/VISTARIL) 10 MG tablet Take 1 tablet (10 mg total) by mouth daily. 90 tablet 3 05/07/2020 at 0700  . insulin glargine (  LANTUS SOLOSTAR) 100 UNIT/ML Solostar Pen Inject 50 Units into the skin daily. 15 mL 3 05/07/2020 at 0700  . insulin lispro (HUMALOG) 100 UNIT/ML KwikPen Inject 18 Units into the skin 3 (three) times daily before meals. 15 mL 3 05/07/2020 at 1400  . ketoconazole (NIZORAL) 2 % cream APPLY 1 FINGERTIP AMOUNT TO EACH FOOT DAILY. (Patient taking differently: Apply 1 application topically in the morning, at noon, and at bedtime.) 30 g 2 05/07/2020 at Unknown time  . losartan (COZAAR) 100 MG tablet Take 1 tablet (100 mg total) by mouth daily. 90 tablet 3 05/07/2020 at 0700  . metolazone (ZAROXOLYN) 2.5 MG tablet Take 1 tablet (2.5 mg total) by mouth 2 (two) times a week. TAKE 1 TABLET TWICE WEEKLY ON MONDAYS AND THURSDAYS ONLY 15 tablet 6 05/04/2020 at Unknown time  . montelukast (SINGULAIR) 10 MG tablet TAKE 1 TABLET BY MOUTH EVERYDAY AT BEDTIME (Patient taking differently: Take 10 mg by mouth at bedtime.) 90 tablet 3 05/07/2020 at Unknown time  . Multiple Vitamins-Minerals (MULTIVITAMIN WITH MINERALS) tablet Take 1 tablet by mouth daily.   05/07/2020  . NON FORMULARY Uses a C-PAP at bedtime   05/07/2020 at Unknown time  . nystatin (MYCOSTATIN) 100000 UNIT/ML suspension TAKE 5 MLS (500,000 UNITS TOTAL) BY MOUTH 4 (FOUR) TIMES DAILY. SWISH AND SWALLOW. (Patient taking differently: Take 5 mLs by mouth 4 (four) times daily as needed (thrush). Swish and swallow.) 60 mL 3 Past Month at Unknown time  . Omega-3  Fatty Acids (FISH OIL) 1000 MG CAPS Take 1,000 mg by mouth in the morning.   Past Month at Unknown time  . omeprazole (PRILOSEC) 20 MG capsule TAKE 1 CAPSULE BY MOUTH EVERY DAY 90 capsule 3 05/08/2020 at 0600  . Potassium Chloride ER 20 MEQ TBCR Take 20 mEq by mouth daily.   05/07/2020 at Unknown time  . Semaglutide,0.25 or 0.5MG/DOS, (OZEMPIC, 0.25 OR 0.5 MG/DOSE,) 2 MG/1.5ML SOPN Inject 0.25 mg into the skin once a week. Minus 2 clicks (Patient taking differently: Inject 0.25 mg into the skin every Thursday. Minus 2 clicks) 4.5 mL 0 4/40/1027 at Unknown time  . venlafaxine XR (EFFEXOR-XR) 75 MG 24 hr capsule TAKE 1 CAPSULE BY MOUTH DAILY WITH BREAKFAST. (Patient taking differently: Take 75 mg by mouth daily with breakfast.) 90 capsule 2 05/07/2020 at 0700  . Accu-Chek Softclix Lancets lancets TEST 4 TIMES DAILY 200 each 6 Unknown at Unknown time  . B-D UF III MINI PEN NEEDLES 31G X 5 MM MISC CHECK SUGARS 4 TIMES A DAY BEFORE MEALS AND AT BEDTIME. 100 each 12   . blood glucose meter kit and supplies KIT Dispense based on patient and insurance preference. Use up to four times daily as directed. (FOR ICD-9 250.00, 250.01). 1 each 0 Unknown at Unknown time  . glucose blood (ACCU-CHEK AVIVA PLUS) test strip 1 each by Other route 3 (three) times daily. TEST 3 TIMES DAILY 300 each 3 Unknown at Unknown time  . NARCAN 4 MG/0.1ML LIQD nasal spray kit Place 1 spray into the nose as needed (accidental overdose).     . varenicline (CHANTIX CONTINUING MONTH PAK) 1 MG tablet Take 1 tablet (1 mg total) by mouth 2 (two) times daily. 60 tablet 3 More than a month at Unknown time    Eudelia Bunch, Pharm.D 05/08/2020 1:11 PM

## 2020-05-08 NOTE — Transfer of Care (Signed)
Immediate Anesthesia Transfer of Care Note  Patient: [name]  Procedure(s) Performed: LAPAROSCOPIC GASTRIC SLEEVE RESECTION (N/A Abdomen) UPPER GI ENDOSCOPY (N/A Esophagus)  Patient Location: PACU  Anesthesia Type:General  Level of Consciousness: drowsy and patient cooperative  Airway & Oxygen Therapy: Patient Spontanous Breathing and Patient connected to face mask oxygen  Post-op Assessment: Report given to RN and Post -op Vital signs reviewed and stable  Post vital signs: Reviewed and stable  Last Vitals:  Vitals Value Taken Time  BP    Temp    Pulse 74 05/08/20 1217  Resp 22 05/08/20 1217  SpO2 99 % 05/08/20 1217  Vitals shown include unvalidated device data.  Last Pain:  Vitals:   05/08/20 0914  TempSrc:   PainSc: 10-Worst pain ever      Patients Stated Pain Goal: 9 (84/66/59 [id number])  Complications: No complications documented.

## 2020-05-08 NOTE — Progress Notes (Signed)
Water started. 

## 2020-05-08 NOTE — Op Note (Signed)
[name] [id number] 03/16/1965 05/08/2020  Preoperative diagnosis: sleeve gastrectomy in progress  Postoperative diagnosis: Same   Procedure: Upper endoscopy   Surgeon: Catalina [place]ig[place] B. Hassell Done  M.D., FACS   Anesthesia: Gen.   Indications for procedure: This patient was undergoing a sleeve gastrectomy by Dr. Kieth Brightly.    Description of procedure: The endoscopy was placed in the mouth and into the oropharynx and under endoscopic vision it was advanced to the esophagogastric junction.  The pouch was insufflated and it appeared to be a symmetrical sleeve.  The pylorus was identified.  .   No bleeding or leaks were detected.  The scope was withdrawn without difficulty.     Matt B. Hassell Done, MD, FACS General, Bariatric, & Minimally Invasive Surgery [facility], [place]

## 2020-05-08 NOTE — Anesthesia Postprocedure Evaluation (Signed)
Anesthesia Post Note  Patient: [name]  Procedure(s) Performed: LAPAROSCOPIC GASTRIC SLEEVE RESECTION (N/A ) UPPER GI ENDOSCOPY (N/A )     Patient location during evaluation: PACU Anesthesia Type: General Level of consciousness: awake Pain management: pain level controlled Vital Signs Assessment: post-procedure vital signs reviewed and stable Respiratory status: spontaneous breathing Cardiovascular status: stable Postop Assessment: no apparent nausea or vomiting Anesthetic complications: no   No complications documented.  Last Vitals:  Vitals:   05/08/20 0840  BP: (!) 184/105  Pulse: 85  Resp: 18  Temp: 36.7 C  SpO2: 95%    Last Pain:  Vitals:   05/08/20 0914  TempSrc:   PainSc: 10-Worst pain ever                 Lelia Jons

## 2020-05-08 NOTE — Progress Notes (Signed)
Called and spoke with patient.  Pt has started water and will begin protein once meets goal.  Pt has already ambulated.  Discussed goals for the rest of the evening and will follow up with pt in the morning to provide bariatric discharge education.   

## 2020-05-08 NOTE — H&P (Signed)
[name] is an 55 y.o. female.   Chief Complaint: obesity HPI: 55 yo female with long history of obesity with diabetes, reflux, CHF, arthritis, and sleep apnea. She has completed all requirements and is ready to proceed with surgery.  Today she complains of right leg pain. She had an injection 2-3 weeks ago. Pain is anterior and shooting from the hip.  Past Medical History:  Diagnosis Date  . Alcoholism ([place])   . Anxiety   . Arthritis 04-10-11   hips, shoulders, back  . Asthma   . Bipolar disorder ([place])   . Cardiomegaly   . Carpal tunnel syndrome, bilateral   . Cervical cancer ([place]) 1993   cervical, no treatment done, went away per pt  . Cervical dysplasia or atypia 04-10-11   '93- once dx.-got pregnant-no intervention, then postpartum, no dysplasia found  . CHF (congestive heart failure) ([place])    no cardiologist 2014 dx, none now  . Chronic hypoxemic respiratory failure ([place])   . Chronic kidney disease    stage 3 kidney disease  . 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 Pathology showed Condyloma, gluteal CONDYLOMA ACUMINATUM.   [name] Kitchen COPD (chronic obstructive pulmonary disease) ([place])   . Depression   . Diabetes mellitus without complication ([place])   . Dyspnea    with actity, sitting  . Fall 12/16/2016  . Fibrocystic breast disease   . GERD (gastroesophageal reflux disease)   . Grade I diastolic dysfunction 78/93/8101   Noted on ECHO  . Hepatitis    hep B-count is low at present,doesn't register  . History of PSVT (paroxysmal supraventricular tachycardia)   . Hypercalcemia   . Hyperlipidemia   . Hypertension   . Incomplete left bundle branch block (LBBB) 09/24/2018   Noted on EKG  . Lung nodule 11/2017   right and left lung  . Migraine headache    none recent  . Morbid obesity ([place]) 03/27/2006  . Neuropathy   . Pneumonia    walking pneu 15 yrs. ago  . Skin lesion 03/15/2011   In gluteal crease now s/p removal by Dr. [name] of [facility]  Sur[facility] on 3/12. Path shows condyloma.     . Sleep apnea 04-10-11   uses cpap, pt does not know settings  . Trigger finger    left third  . Urge incontinence of urine     Past Surgical History:  Procedure Laterality Date  . ANAL FISTULECTOMY  04/17/2011   Procedure: FISTULECTOMY ANAL;  Surgeon: Imogene Burn. [name], MD;  Location: [facility];  Service: General;  Laterality: N/A;  Excision of Condyloma Gluteal Cleft   . ANAL RECTAL MANOMETRY N/A 03/26/2019   Procedure: ANO RECTAL MANOMETRY;  Surgeon: Thornton Park, MD;  [facility];  Service: Gastroenterology;  Laterality: N/A;  . BIOPSY  03/09/2019   Procedure: BIOPSY;  Surgeon: Thornton Park, MD;  [facility];  Service: Gastroenterology;;  . Lum Keas INJECTION N/A 12/29/2018   Procedure: BOTOX INJECTION(100 UNITS)  WITH CYSTOSCOPY;  Surgeon: Ceasar Mons, MD;  [facility];  Service: Urology;  Laterality: N/A;  . BOTOX INJECTION N/A 07/27/2019   Procedure: BOTOX INJECTION WITH CYSTOSCOPY;  Surgeon: Ceasar Mons, MD;  [facility];  Service: Urology;  Laterality: N/A;  ONLY NEEDS 30 MIN  . BREAST CYST EXCISION     bilateral breast, 3 cysts removed from each breast  . BREAST EXCISIONAL BIOPSY Right    x 3  . BREAST EXCISIONAL BIOPSY Left  x 3  . CARPAL TUNNEL RELEASE Bilateral   . Cervical biospy    . CERVICAL FUSION  04-10-11   x3- cervical fusion with plating and screws-Dr. [name]  . COLONOSCOPY WITH PROPOFOL N/A 07/06/2015   Procedure: COLONOSCOPY WITH PROPOFOL;  Surgeon: [name], MD;  [facility];  Service: Endoscopy;  Laterality: N/A;  . COLONOSCOPY WITH PROPOFOL N/A 03/09/2019   Procedure: COLONOSCOPY WITH PROPOFOL;  Surgeon: Thornton Park, MD;  [facility];  Service: Gastroenterology;  Laterality: N/A;  . DOPPLER ECHOCARDIOGRAPHY  04/06/2012   AT [place] [place] 55-60%  . FOOT SURGERY Left 2018  . FRACTURE SURGERY     little toe left foot  . I & D  EXTREMITY Left 12/18/2016   Procedure: IRRIGATION AND DEBRIDEMENT LEFT FOOT, CLOSURE;  Surgeon: [name], MD;  Location: [facility];  Service: Orthopedics;  Laterality: Left;  . [place] SURGERY  04-10-11   x5-Lumbar fusion-retained hardware.(Dr. Louanne Skye)  . MULTIPLE TOOTH EXTRACTIONS    . POLYPECTOMY  03/09/2019   Procedure: POLYPECTOMY;  Surgeon: Thornton Park, MD;  [facility];  Service: Gastroenterology;;  . SHOULDER ARTHROSCOPY WITH ROTATOR CUFF REPAIR AND SUBACROMIAL DECOMPRESSION Left 05/07/2019   Procedure: LEFT SHOULDER ARTHROSCOPY WITH DEBRIDEMENT, DISTAL CLAVICLE EXCISION,  SUBACROMIAL DECOMPRESSION AND POSSIBLE ROTATOR CUFF REPAIR;  Surgeon: [name], MD;  Location: [facility];  Service: Orthopedics;  Laterality: Left;  . TEE WITHOUT CARDIOVERSION N/A 04/06/2012   Procedure: TRANSESOPHAGEAL ECHOCARDIOGRAM (TEE);  Surgeon: [name], MD;  Location[facility];  Service: Cardiovascular;  Laterality: N/A;  . TRIGGER FINGER RELEASE Right    middle finger  . TRIGGER FINGER RELEASE Left 06/28/2016   Procedure: RELEASE TRIGGER FINGER LEFT 3RD FINGER;  Surgeon: [name], MD;  Location: [place];  Service: Orthopedics;  Laterality: Left;  . TRIGGER FINGER RELEASE Right 06/12/2016   Procedure: RIGHT INDEX FINGER TRIGGER RELEASE;  Surgeon: [name], MD;  Location: [place];  Service: Orthopedics;  Laterality: Right;  . TRIGGER FINGER RELEASE Right 01/19/2018   Procedure: RIGHT RING FINGER TRIGGER FINGER RELEASE;  Surgeon: [name], MD;  Location: [place];  Service: Orthopedics;  Laterality: Right;  . TRIGGER FINGER RELEASE Left 05/07/2019   Procedure: RELEASE TRIGGER FINGER LEFT INDEX FINGER;  Surgeon: [name], MD;  Location: [place];  Service: Orthopedics;  Laterality: Left;    Family History  Problem Relation Age of Onset  . Pulmonary embolism Mother   . Stroke Father   . Alcohol abuse Father   . Pneumonia Father   . Hypertension Father   . Breast cancer  Maternal Aunt   . Hypertension Other   . Diabetes Other        Aunts and cousins  . Asthma Sister   . Depression Sister   . Arthritis Sister   . Sickle cell anemia Son    Social History:  reports that she quit smoking about 9 months ago. Her smoking use included cigarettes. She started smoking about 43 years ago. She has a 8.00 pack-year smoking history. She has never used smokeless tobacco. She reports previous drug use. Drugs: Marijuana and "Crack" cocaine. She reports that she does not drink alcohol.  Allergies:  Allergies  Allergen Reactions  . Aspirin Other (See Comments)    Kidney disease  . 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 Corticosteroids Other (See Comments)    Hyperglycemia   . Levofloxacin Itching  Medications Prior to Admission  Medication Sig Dispense Refill  . acetaminophen (TYLENOL) 500 MG tablet Take 1,000 mg by mouth 2 (two) times daily.    [name] Kitchen albuterol (VENTOLIN HFA) 108 (90 Base) MCG/ACT inhaler Inhale 2 puffs into the lungs every 6 (six) hours as needed for wheezing or shortness of breath. 18 g 3  . allopurinol (ZYLOPRIM) 300 MG tablet Take 300 mg by mouth in the morning.    [name] Kitchen ammonium lactate (LAC-HYDRIN) 12 % lotion APPLY TWICE A DAY AS NEEDED FOR DRY SKIN (Patient taking differently: Apply 1 application topically in the morning and at bedtime.) 400 mL 3  . atorvastatin (LIPITOR) 40 MG tablet TAKE 1 TABLET BY MOUTH EVERY DAY (Patient taking differently: Take 40 mg by mouth in the morning.) 90 tablet 3  . b complex vitamins capsule Take 1 capsule by mouth daily.    . baclofen (LIORESAL) 10 MG tablet Take 1 tablet (10 mg total) by mouth 3 (three) times daily as needed for muscle spasms. (Patient taking differently: Take 10 mg by mouth 3 (three) times daily.) 90 each 0  . busPIRone (BUSPAR) 10 MG tablet Take 1 tablet (10 mg total) by mouth 3 (three) times daily. 270 tablet 4  . diclofenac Sodium (VOLTAREN) 1 % GEL APPLY 2 GRAMS TO  AFFECTED AREA 4 TIMES A DAY 500 g 1  . diltiazem (CARDIZEM CD) 120 MG 24 hr capsule TAKE 1 CAPSULE (120 MG TOTAL) BY MOUTH 2 (TWO) TIMES DAILY. 180 capsule 2  . docusate sodium (COLACE) 100 MG capsule Take 200 mg by mouth 2 (two) times daily.    . empagliflozin (JARDIANCE) 25 MG TABS tablet Take 1 tablet (25 mg total) by mouth daily. (Patient taking differently: Take 25 mg by mouth in the morning.) 90 tablet 3  . febuxostat (ULORIC) 40 MG tablet Take 40 mg by mouth daily.    . fluticasone (FLONASE) 50 MCG/ACT nasal spray Place 2 sprays into both nostrils 2 (two) times daily. 16 g 6  . Fluticasone-Umeclidin-Vilant (TRELEGY ELLIPTA) 100-62.5-25 MCG/INH AEPB Inhale 1 puff into the lungs daily. 180 each 3  . furosemide (LASIX) 40 MG tablet TAKE 2 TABLETS (80 MG TOTAL) BY MOUTH 2 (TWO) TIMES DAILY. (Patient taking differently: Take 80 mg by mouth 2 (two) times daily. Morning & lunch) 360 tablet 1  . gabapentin (NEURONTIN) 300 MG capsule Take 2 capsules (600 mg total) by mouth 3 (three) times daily. TAKE 2 CAPSULES (600 MG TOTAL) BY MOUTH in the morning and at lunch and take 3 capsules at night (Patient taking differently: Take 600 mg by mouth 3 (three) times daily.) 540 capsule 2  . HYDROcodone-acetaminophen (NORCO) 7.5-325 MG tablet Take 1 tablet by mouth every 6 (six) hours as needed for moderate pain. 20 tablet 0  . hydrOXYzine (ATARAX/VISTARIL) 10 MG tablet Take 1 tablet (10 mg total) by mouth daily. 90 tablet 3  . insulin glargine (LANTUS SOLOSTAR) 100 UNIT/ML Solostar Pen Inject 50 Units into the skin daily. 15 mL 3  . insulin lispro (HUMALOG) 100 UNIT/ML KwikPen Inject 18 Units into the skin 3 (three) times daily before meals. 15 mL 3  . ketoconazole (NIZORAL) 2 % cream APPLY 1 FINGERTIP AMOUNT TO EACH FOOT DAILY. (Patient taking differently: Apply 1 application topically in the morning, at noon, and at bedtime.) 30 g 2  . losartan (COZAAR) 100 MG tablet Take 1 tablet (100 mg total) by mouth daily.  90 tablet 3  . metolazone (ZAROXOLYN) 2.5 MG tablet Take  1 tablet (2.5 mg total) by mouth 2 (two) times a week. TAKE 1 TABLET TWICE WEEKLY ON MONDAYS AND THURSDAYS ONLY 15 tablet 6  . montelukast (SINGULAIR) 10 MG tablet TAKE 1 TABLET BY MOUTH EVERYDAY AT BEDTIME (Patient taking differently: Take 10 mg by mouth at bedtime.) 90 tablet 3  . Multiple Vitamins-Minerals (MULTIVITAMIN WITH MINERALS) tablet Take 1 tablet by mouth daily.    . NON FORMULARY Uses a C-PAP at bedtime    . nystatin (MYCOSTATIN) 100000 UNIT/ML suspension TAKE 5 MLS (500,000 UNITS TOTAL) BY MOUTH 4 (FOUR) TIMES DAILY. SWISH AND SWALLOW. (Patient taking differently: Take 5 mLs by mouth 4 (four) times daily as needed (thrush). Swish and swallow.) 60 mL 3  . Omega-3 Fatty Acids (FISH OIL) 1000 MG CAPS Take 1,000 mg by mouth in the morning.    [name] Kitchen omeprazole (PRILOSEC) 20 MG capsule TAKE 1 CAPSULE BY MOUTH EVERY DAY 90 capsule 3  . Potassium Chloride ER 20 MEQ TBCR Take 20 mEq by mouth daily.    . Semaglutide,0.25 or 0.5MG/DOS, (OZEMPIC, 0.25 OR 0.5 MG/DOSE,) 2 MG/1.5ML SOPN Inject 0.25 mg into the skin once a week. Minus 2 clicks (Patient taking differently: Inject 0.25 mg into the skin every Thursday. Minus 2 clicks) 4.5 mL 0  . venlafaxine XR (EFFEXOR-XR) 75 MG 24 hr capsule TAKE 1 CAPSULE BY MOUTH DAILY WITH BREAKFAST. (Patient taking differently: Take 75 mg by mouth daily with breakfast.) 90 capsule 2  . Accu-Chek Softclix Lancets lancets TEST 4 TIMES DAILY 200 each 6  . B-D UF III MINI PEN NEEDLES 31G X 5 MM MISC CHECK SUGARS 4 TIMES A DAY BEFORE MEALS AND AT BEDTIME. 100 each 12  . blood glucose meter kit and supplies KIT Dispense based on patient and insurance preference. Use up to four times daily as directed. (FOR ICD-9 250.00, 250.01). 1 each 0  . glucose blood (ACCU-CHEK AVIVA PLUS) test strip 1 each by Other route 3 (three) times daily. TEST 3 TIMES DAILY 300 each 3  . NARCAN 4 MG/0.1ML LIQD nasal spray kit Place 1 spray into  the nose as needed (accidental overdose).    . varenicline (CHANTIX CONTINUING MONTH PAK) 1 MG tablet Take 1 tablet (1 mg total) by mouth 2 (two) times daily. 60 tablet 3    No results found. However, due to the size of the patient record, not all encounters were searched. Please check Results Review for a complete set of results. No results found.  Review of Systems  Constitutional: Negative for chills and fever.  HENT: Negative for hearing loss.   Respiratory: Negative for cough.   Cardiovascular: Negative for chest pain and palpitations.  Gastrointestinal: Negative for abdominal pain, nausea and vomiting.  Genitourinary: Negative for dysuria and urgency.  Musculoskeletal: Negative for myalgias and neck pain.  Skin: Negative for rash.  Neurological: Negative for dizziness and headaches.  Hematological: Does not bruise/bleed easily.  Psychiatric/Behavioral: Negative for suicidal ideas.    Blood pressure (!) 184/105, pulse 85, temperature 98.1 F (36.7 C), temperature source Oral, resp. rate 18, height _0  (1.753 m), weight (!) 157.9 kg, last menstrual period 04/10/2006, SpO2 95 %. Physical Exam Vitals reviewed.  Constitutional:      Appearance: She is well-developed.  HENT:     Head: Normocephalic and atraumatic.  Eyes:     Conjunctiva/sclera: Conjunctivae normal.     Pupils: Pupils are equal, round, and reactive to light.  Cardiovascular:     Rate and Rhythm: Normal rate  and regular rhythm.  Pulmonary:     Effort: Pulmonary effort is normal.     Breath sounds: Normal breath sounds.  Abdominal:     General: Bowel sounds are normal. There is no distension.     Palpations: Abdomen is soft.     Tenderness: There is no abdominal tenderness.  Musculoskeletal:        General: Normal range of motion.     Cervical back: Normal range of motion and neck supple.  Skin:    General: Skin is warm and dry.  Neurological:     Mental Status: She is alert and oriented to person,  place, and time.  Psychiatric:        Behavior: Behavior normal.   Assessment/Plan 55 yo female with morbid obesity with multiple comorbid conditions -lap sleeve gastrectomy -ERAs protocol -inpatient admission  Mickeal Skinner, MD 05/08/2020, 10:03 AM

## 2020-05-08 NOTE — Anesthesia Procedure Notes (Signed)
Procedure Name: Intubation Date/Time: 05/08/2020 10:36 AM Performed by: Montel Clock, CRNA Pre-anesthesia Checklist: Patient identified, Emergency Drugs available, Suction available, Patient being monitored and Timeout performed Patient Re-evaluated:Patient Re-evaluated prior to induction Oxygen Delivery Method: Circle system utilized Preoxygenation: Pre-oxygenation with 100% oxygen Induction Type: IV induction Ventilation: Mask ventilation without difficulty and Oral airway inserted - appropriate to patient size Laryngoscope Size: Mac and 3 Grade View: Grade II Tube type: Oral Tube size: 7.0 mm Number of attempts: 1 Airway Equipment and Method: Stylet Placement Confirmation: ETT inserted through vocal cords under direct vision,  positive ETCO2 and breath sounds checked- equal and bilateral Secured at: 21 cm Tube secured with: Tape Dental Injury: Teeth and Oropharynx as per pre-operative assessment  Comments: Intubation by paramedic student.

## 2020-05-09 ENCOUNTER — Inpatient Hospital Stay (HOSPITAL_COMMUNITY): Payer: 59

## 2020-05-09 ENCOUNTER — Encounter (HOSPITAL_COMMUNITY): Payer: Self-pay | Admitting: General Surgery

## 2020-05-09 LAB — GLUCOSE, CAPILLARY
Glucose-Capillary: 140 mg/dL — ABNORMAL HIGH (ref 70–99)
Glucose-Capillary: 162 mg/dL — ABNORMAL HIGH (ref 70–99)
Glucose-Capillary: 164 mg/dL — ABNORMAL HIGH (ref 70–99)
Glucose-Capillary: 166 mg/dL — ABNORMAL HIGH (ref 70–99)
Glucose-Capillary: 175 mg/dL — ABNORMAL HIGH (ref 70–99)
Glucose-Capillary: 198 mg/dL — ABNORMAL HIGH (ref 70–99)

## 2020-05-09 LAB — CBC WITH DIFFERENTIAL/PLATELET
Abs Immature Granulocytes: 0.09 10*3/uL — ABNORMAL HIGH (ref 0.00–0.07)
Basophils Absolute: 0 10*3/uL (ref 0.0–0.1)
Basophils Relative: 0 %
Eosinophils Absolute: 0 10*3/uL (ref 0.0–0.5)
Eosinophils Relative: 0 %
HCT: 45 % (ref 36.0–46.0)
Hemoglobin: 15.6 g/dL — ABNORMAL HIGH (ref 12.0–15.0)
Immature Granulocytes: 1 %
Lymphocytes Relative: 14 %
Lymphs Abs: 2.1 10*3/uL (ref 0.7–4.0)
MCH: 28.7 pg (ref 26.0–34.0)
MCHC: 34.7 g/dL (ref 30.0–36.0)
MCV: 82.7 fL (ref 80.0–100.0)
Monocytes Absolute: 1.2 10*3/uL — ABNORMAL HIGH (ref 0.1–1.0)
Monocytes Relative: 8 %
Neutro Abs: 11.8 10*3/uL — ABNORMAL HIGH (ref 1.7–7.7)
Neutrophils Relative %: 77 %
Platelets: 312 10*3/uL (ref 150–400)
RBC: 5.44 MIL/uL — ABNORMAL HIGH (ref 3.87–5.11)
RDW: 14.1 % (ref 11.5–15.5)
WBC: 15.2 10*3/uL — ABNORMAL HIGH (ref 4.0–10.5)
nRBC: 0 % (ref 0.0–0.2)

## 2020-05-09 LAB — CBC
HCT: 43.6 % (ref 36.0–46.0)
Hemoglobin: 15 g/dL (ref 12.0–15.0)
MCH: 28.5 pg (ref 26.0–34.0)
MCHC: 34.4 g/dL (ref 30.0–36.0)
MCV: 82.9 fL (ref 80.0–100.0)
Platelets: 283 10*3/uL (ref 150–400)
RBC: 5.26 MIL/uL — ABNORMAL HIGH (ref 3.87–5.11)
RDW: 14.2 % (ref 11.5–15.5)
WBC: 13.1 10*3/uL — ABNORMAL HIGH (ref 4.0–10.5)
nRBC: 0 % (ref 0.0–0.2)

## 2020-05-09 LAB — COMPREHENSIVE METABOLIC PANEL
ALT: 38 U/L (ref 0–44)
AST: 33 U/L (ref 15–41)
Albumin: 3.7 g/dL (ref 3.5–5.0)
Alkaline Phosphatase: 77 U/L (ref 38–126)
Anion gap: 12 (ref 5–15)
BUN: 33 mg/dL — ABNORMAL HIGH (ref 6–20)
CO2: 29 mmol/L (ref 22–32)
Calcium: 9.3 mg/dL (ref 8.9–10.3)
Chloride: 95 mmol/L — ABNORMAL LOW (ref 98–111)
Creatinine, Ser: 1.19 mg/dL — ABNORMAL HIGH (ref 0.44–1.00)
GFR, Estimated: 54 mL/min — ABNORMAL LOW (ref >60)
Glucose, Bld: 157 mg/dL — ABNORMAL HIGH (ref 70–99)
Potassium: 3.3 mmol/L — ABNORMAL LOW (ref 3.5–5.1)
Sodium: 136 mmol/L (ref 135–145)
Total Bilirubin: 0.4 mg/dL (ref 0.3–1.2)
Total Protein: 6.8 g/dL (ref 6.5–8.1)

## 2020-05-09 LAB — SURGICAL PATHOLOGY

## 2020-05-09 MED ORDER — DICLOFENAC SODIUM 1 % EX GEL
2.0000 g | Freq: Four times a day (QID) | CUTANEOUS | Status: DC
  Administered 2020-05-09: 2 g via TOPICAL
  Filled 2020-05-09: qty 100

## 2020-05-09 MED ORDER — MAGIC MOUTHWASH
5.0000 mL | Freq: Three times a day (TID) | ORAL | Status: DC
  Administered 2020-05-09 (×3): 5 mL via ORAL
  Filled 2020-05-09 (×5): qty 5

## 2020-05-09 MED ORDER — ENOXAPARIN (LOVENOX) PATIENT EDUCATION KIT
PACK | Freq: Once | Status: AC
  Filled 2020-05-09: qty 1

## 2020-05-09 MED ORDER — LOSARTAN POTASSIUM 50 MG PO TABS
100.0000 mg | ORAL_TABLET | Freq: Every day | ORAL | Status: DC
  Administered 2020-05-09: 100 mg via ORAL
  Filled 2020-05-09: qty 2

## 2020-05-09 NOTE — Discharge Instructions (Signed)
GASTRIC BYPASS / SLEEVE  Home Care Instructions  These instructions are to help you care for yourself when you go home.  Call: If you have any problems. . Call [phone] and ask for the surgeon on call . If you have an emergency related to your surgery please use the ER at [place].  . Tell the ER staff that you are a new post-op gastric bypass or gastric sleeve patient   Signs and symptoms to report: . Severe vomiting or nausea o If you cannot handle clear liquids for longer than 1 day, call your surgeon  . Abdominal pain which does not get better after taking your pain medication . Fever greater than 100.4 F and chills . Heart rate over 100 beats a minute . Trouble breathing . Chest pain .  Redness, swelling, drainage, or foul odor at incision (surgical) sites .  If your incisions open or pull apart . Swelling or pain in calf (lower leg) . Diarrhea (Loose bowel movements that happen often), frequent watery, uncontrolled bowel movements . Constipation, (no bowel movements for 3 days) if this happens:  o Take Milk of Magnesia, 2 tablespoons by mouth, 3 times a day for 2 days if needed o Stop taking Milk of Magnesia once you have had a bowel movement o Call your doctor if constipation continues Or o Take Miralax  (instead of Milk of Magnesia) following the label instructions o Stop taking Miralax once you have had a bowel movement o Call your doctor if constipation continues . Anything you think is "abnormal for you"   Normal side effects after surgery: . Unable to sleep at night or unable to concentrate . Irritability . Being tearful (crying) or depressed These are common complaints, possibly related to your anesthesia, stress of surgery and change in lifestyle, that usually go away a few weeks after surgery.  If these feelings continue, call your medical doctor.  Wound Care: You may have surgical glue, steri-strips, or staples over your incisions after surgery . Surgical  glue:  Looks like a clear film over your incisions and will wear off a little at a time . Steri-strips : Adhesive strips of tape over your incisions. You may notice a yellowish color on the skin under the steri-strips. This is used to make the   steri-strips stick better. Do not pull the steri-strips off - let them fall off . Staples: Staples may be removed before you leave the hospital o If you go home with staples, call [facility] at for an appointment with your surgeon's nurse to have staples removed 10 days after surgery, [phone] . Showering: You may shower two (2) days after your surgery unless your surgeon tells you differently o Wash gently around incisions with warm soapy water, rinse well, and gently pat dry  o If you have a drain (tube from your incision), you may need someone to hold this while you shower  o No tub baths until staples are removed and incisions are healed     Medications: . Medications should be liquid or crushed if larger than the size of a dime . Extended release pills (medication that releases a little bit at a time through the day) should not be crushed . Depending on the size and number of medications you take, you may need to space (take a few throughout the day)/change the time you take your medications so that you do not over-fill your pouch (smaller stomach) . Make sure you follow-up with   your primary care physician to make medication changes needed during rapid weight loss and life-style changes . If you have diabetes, follow up with the doctor that orders your diabetes medication(s) within one week after surgery and check your blood sugar regularly. . Do not drive while taking narcotics (pain medications) . DO NOT take NSAID'S (Examples of NSAID's include ibuprofen, naproxen)  Diet:                    First 2 Weeks  You will see the nutritionist about two (2) weeks after your surgery. The nutritionist will increase the types of foods you can  eat if you are handling liquids well: . If you have severe vomiting or nausea and cannot handle clear liquids lasting longer than 1 day, call your surgeon  Protein Shake . Drink at least 2 ounces of shake 5-6 times per day . Each serving of protein shakes (usually 8 - 12 ounces) should have a minimum of:  o 15 grams of protein  o And no more than 5 grams of carbohydrate  . Goal for protein each day: o Men = 80 grams per day o Women = 60 grams per day . Protein powder may be added to fluids such as non-fat milk or Lactaid milk or Soy milk (limit to 35 grams added protein powder per serving)  Hydration . Slowly increase the amount of water and other clear liquids as tolerated (See Acceptable Fluids) . Slowly increase the amount of protein shake as tolerated  .  Sip fluids slowly and throughout the day . May use sugar substitutes in small amounts (no more than 6 - 8 packets per day; i.e. Splenda)  Fluid Goal . The first goal is to drink at least 8 ounces of protein shake/drink per day (or as directed by the nutritionist);  See handout from pre-op Bariatric Education Class for examples of protein shake/drink.   o Slowly increase the amount of protein shake you drink as tolerated o You may find it easier to slowly sip shakes throughout the day o It is important to get your proteins in first . Your fluid goal is to drink 64 - 100 ounces of fluid daily o It may take a few weeks to build up to this . 32 oz (or more) should be clear liquids  And  . 32 oz (or more) should be full liquids (see below for examples) . Liquids should not contain sugar, caffeine, or carbonation  Clear Liquids: . Water or Sugar-free flavored water (i.e. Fruit H2O, Propel) . Decaffeinated coffee or tea (sugar-free) . Taqwa Deem Lite, Wyler's Lite, Minute Maid Lite . Sugar-free Jell-O . Bouillon or broth . Sugar-free Popsicle:   *Less than 20 calories each; Limit 1 per day  Full Liquids: Protein Shakes/Drinks + 2  choices per day of other full liquids . Full liquids must be: o No More Than 12 grams of Carbs per serving  o No More Than 3 grams of Fat per serving . Strained low-fat cream soup . Non-Fat milk . Fat-free Lactaid Milk . Sugar-free yogurt (Dannon Lite & Fit, Greek yogurt)      Vitamins and Minerals . Start 1 day after surgery unless otherwise directed by your surgeon . Bariatric Specific Complete Multivitamins . Chewable Calcium Citrate with Vitamin D-3 (Example: 3 Chewable Calcium Plus 600 with Vitamin D-3) o Take 500 mg three (3) times a day for a total of 1500 mg each day o Do not take all 3 doses   of calcium at one time as it may cause constipation, and you can only absorb 500 mg  at a time  o Do not mix multivitamins containing iron with calcium supplements; take 2 hours apart  . Menstruating women and those at risk for anemia (a blood disease that causes weakness) may need extra iron o Talk with your doctor to see if you need more iron . If you need extra iron: Total daily Iron recommendation (including Vitamins) is 50 to 100 mg Iron/day . Do not stop taking or change any vitamins or minerals until you talk to your nutritionist or surgeon . Your nutritionist and/or surgeon must approve all vitamin and mineral supplements   Activity and Exercise: It is important to continue walking at home.  Limit your physical activity as instructed by your doctor.  During this time, use these guidelines: . Do not lift anything greater than ten (10) pounds for at least two (2) weeks . Do not go back to work or drive until your surgeon says you can . You may have sex when you feel comfortable  o It is VERY important for female patients to use a reliable birth control method; fertility often increases after surgery  o Do not get pregnant for at least 18 months . Start exercising as soon as your doctor tells you that you can o Make sure your doctor approves any physical activity . Start with a  simple walking program . Walk 5-15 minutes each day, 7 days per week.  . Slowly increase until you are walking 30-45 minutes per day Consider joining our BELT program. [phone] or email [email or web address]   Special Instructions Things to remember:  . Use your CPAP when sleeping if this applies to you, do not stop the use of CPAP unless directed by physician after a sleep study . [facility] has a free Bariatric Surgery Support Group that meets monthly, the 3rd Thursday, 6 pm.  Please review discharge information for date and location of this meeting. . It is very important to keep all follow up appointments with your surgeon, nutritionist, primary care physician, and behavioral health practitioner o After the first year, please follow up with your bariatric surgeon and nutritionist at least once a year in order to maintain best weight loss results   [facility]: [phone] [facility]: [phone] Bariatric Nurse Coordinator: [phone]      

## 2020-05-09 NOTE — Progress Notes (Signed)
Inpatient Diabetes Program Recommendations  AACE/ADA: New Consensus Statement on Inpatient Glycemic Control (2015)  Target Ranges:  Prepandial:   less than 140 mg/dL      Peak postprandial:   less than 180 mg/dL (1-2 hours)      Critically ill patients:  140 - 180 mg/dL   Lab Results  Component Value Date   GLUCAP 198 (H) 05/09/2020   HGBA1C 8.8 (A) 03/17/2020    Review of Glycemic Control  Diabetes history: DM2 Outpatient Diabetes medications: Jardiance 25 mg QAM, Lantus 50 units QD, Humalog 18 units TID with meals, Ozempic 0.25 mg weekly on Thursdays Current orders for Inpatient glycemic control: Novolog 0-20 units Q4H  HgbA1C - 8.8% CBGs 162,198 mg/dL Received 15 units of Novolog correction on 4/11.  Inpatient Diabetes Program Recommendations:     Humalog 0-20 Q4H at discharge  Will need to monitor blood sugars at least 3-4x/day and call PCP if blood sugars below 70 or above 180 mg/dL on a regular basis.   Will likely not need Jardiance, Ozempic or Lantus.  Follow.  Thank you. Lorenda Peck, RD, LDN, CDE Inpatient Diabetes Coordinator [phone]

## 2020-05-09 NOTE — Progress Notes (Signed)
Adamantly refuses nocturnal CPAP and does not want to see another piece of CPAP equipment come into her room.

## 2020-05-09 NOTE — Progress Notes (Signed)
Placed pt on cpap auto setting with full face mask 3L bled in. Pt says she wears FFM at home.

## 2020-05-09 NOTE — Progress Notes (Signed)
Progress Note: Metabolic and Bariatric Surgery Service   Chief Complaint/Subjective: Tolerating liquids well, pain controlled, complains of bruising in mouth  Objective: Vital signs in last 24 hours: Temp:  [97.4 F (36.3 C)-98.3 F (36.8 C)] 98 F (36.7 C) (04/12 0514) Pulse Rate:  [68-96] 88 (04/12 0514) Resp:  [13-34] 18 (04/12 0514) BP: (131-192)/(8-95) 157/8 (04/12 0514) SpO2:  [91 %-99 %] 93 % (04/12 0728) Weight:  [157.9 kg] 157.9 kg (04/11 0914)    Intake/Output from previous day: 04/11 0701 - 04/12 0700 In: 3273.1 [P.O.:960; I.V.:2163.1; IV Piggyback:150] Out: 3120 [Urine:3100; Blood:20] Intake/Output this shift: No intake/output data recorded.  Lungs: nonlabored  Cardiovascular: RRR  Abd: soft, incisions c/d/i  Extremities: no edema  Neuro: AOx4  Lab Results: CBC  Recent Labs    05/08/20 1648 05/09/20 0324  WBC  --  15.2*  HGB 17.1* 15.6*  HCT 49.8* 45.0  PLT  --  312   BMET Recent Labs    05/09/20 0324  NA 136  K 3.3*  CL 95*  CO2 29  GLUCOSE 157*  BUN 33*  CREATININE 1.19*  CALCIUM 9.3   PT/INR No results for input(s): LABPROT, INR in the last 72 hours. ABG No results for input(s): PHART, HCO3 in the last 72 hours.  Invalid input(s): PCO2, PO2  Studies/Results:  Anti-infectives: Anti-infectives (From admission, onward)   Start     Dose/Rate Route Frequency Ordered Stop   05/08/20 0830  cefoTEtan (CEFOTAN) 2 g in sodium chloride 0.9 % 100 mL IVPB        2 g 200 mL/hr over 30 Minutes Intravenous On call to O.R. 05/08/20 0827 05/08/20 1047      Medications: Scheduled Meds: . acetaminophen  1,000 mg Oral Q8H   Or  . acetaminophen (TYLENOL) oral liquid 160 mg/5 mL  1,000 mg Oral Q8H  . allopurinol  300 mg Oral q AM  . busPIRone  10 mg Oral TID  . diltiazem  120 mg Oral BID  . enoxaparin (LOVENOX) injection  30 mg Subcutaneous Q12H  . fluticasone furoate-vilanterol  1 puff Inhalation Daily   And  . umeclidinium bromide   1 puff Inhalation Daily  . gabapentin  600 mg Oral BID WC   And  . gabapentin  900 mg Oral QHS  . insulin aspart  0-20 Units Subcutaneous Q4H  . magic mouthwash  5 mL Oral TID  . Ensure Max Protein  2 oz Oral Q2H   Continuous Infusions: . dextrose 5 % and 0.45% NaCl 125 mL/hr at 05/08/20 2307  . famotidine (PEPCID) IV 20 mg (05/08/20 1025)  . lactated ringers 10 mL/hr at 05/08/20 1021   PRN Meds:.albuterol, baclofen, hydrALAZINE, morphine injection, ondansetron (ZOFRAN) IV, oxyCODONE, simethicone  Assessment/Plan: Patient Active Problem List   Diagnosis Date Noted  . Cubital tunnel syndrome on left 05/02/2020  . Vaginal discharge 01/20/2020  . Prolapse of anterior vaginal wall 11/10/2019  . Skin ulcer of left thigh, limited to breakdown of skin ([place]) 09/09/2019  . Ulnar nerve compression 08/06/2019  . Diabetic neuropathy associated with type 2 diabetes mellitus ([place]) 08/06/2019  . Body mass index 50.0-59.9, adult ([place]) 06/12/2019  . Impingement syndrome of left shoulder 05/07/2019  . Polyp of colon   . Polyp of ascending colon   . Rectal discomfort 02/12/2019  . Osteoarthritis of right hip 02/12/2019  . Nontraumatic tear of left supraspinatus tendon 12/30/2018  . Gout 12/17/2018  . Nontraumatic complete tear of right rotator cuff 12/08/2018  .  Rotator cuff tear, right 12/01/2018  . Tobacco abuse 08/21/2018  . Right groin pain 07/20/2018  . Depressed mood 07/13/2018  . Paroxysmal SVT (supraventricular tachycardia) ([place]) 03/20/2018  . Pure hypercholesterolemia 03/20/2018  . Muscle cramping 02/25/2018  . Nodule of upper lobe of right lung 02/13/2018  . Falls, subsequent encounter 01/30/2018  . Pain in left foot 11/04/2017  . Essential hypertension 10/27/2017  . Solitary pulmonary nodule 10/07/2017  . Restrictive lung disease secondary to obesity 10/01/2017  . Polycythemia, secondary 10/01/2017  . Chronic viral hepatitis B without delta-agent ([place]) 07/08/2017  . COPD  (chronic obstructive pulmonary disease) with chronic bronchitis ([place]) 06/27/2017  . Chronic kidney disease (CKD), stage IV (severe) ([place]) 01/14/2017  . Type 2 diabetes mellitus with stage 3 chronic kidney disease, with long-term current use of insulin ([place]) 12/12/2016  . Urge incontinence of urine 12/05/2016  . Trigger finger, left index finger 07/15/2016  . Back pain 04/07/2014  . Morbid obesity ([place]) 10/02/2012  . Chronic diastolic CHF (congestive heart failure) ([place]) 04/07/2012  . GERD 01/26/2010  . Obstructive sleep apnea treated with BiPAP 02/03/2008  . FIBROCYSTIC BREAST DISEASE 10/28/2006  . Hyperlipidemia 03/27/2006  . Obesity hypoventilation syndrome ([place]) 03/27/2006  . Depression with anxiety 03/27/2006   s/p Procedure(s): LAPAROSCOPIC GASTRIC SLEEVE RESECTION UPPER GI ENDOSCOPY 05/08/2020 -repeat Hgb this afternoon -XR r hip to avoid outpatient trips -continue ambulation -restart losartan -plan for 2 weeks lovenox  Disposition:  LOS: 1 day  The patient will be in the hospital for normal postop protocol  Mickeal Skinner, MD [phone] [facility]

## 2020-05-09 NOTE — Progress Notes (Signed)
Nutrition Education Note ° °Received consult for diet education for patient s/p bariatric surgery. ° °Discussed 2 week post op diet with pt. Emphasized that liquids must be non carbonated, non caffeinated, and sugar free. Fluid goals discussed. Pt to follow up with outpatient bariatric RD for further diet progression after 2 weeks. Multivitamins and minerals also reviewed. Teach back method used, pt expressed understanding, expect good compliance. ° °If nutrition issues arise, please consult RD. ° °Mary Zuercher, MS, RD, LDN °Inpatient Clinical Dietitian °Contact information available via Amion ° ° °

## 2020-05-09 NOTE — Progress Notes (Signed)
Chaplain engaged in an initial visit with [name].  Chaplain provided education around Scientist, physiological.  [name] wants to appoint her son as her healthcare agent and shared that she has done this paperwork before at a family practice facility.  Chaplain affirmed that because she wants to appoint her son, who is her only child, medical professionals will reach out to him for medical decisions if she is unable to do so.    [name] has the paperwork, and chaplain is available to follow-up.      05/09/20 1500  Clinical Encounter Type  Visited With Patient  Visit Type Initial

## 2020-05-09 NOTE — Progress Notes (Addendum)
Pt is watching "Self-Injection" Lovenox video.  Pt will be discharged on Lovenox.

## 2020-05-09 NOTE — Progress Notes (Signed)
Patient alert and oriented, pain is controlled. Patient is tolerating fluids, advanced to protein shake today, patient is tolerating well.  Reviewed Gastric sleeve discharge instructions with patient and patient is able to articulate understanding.  Provided information on BELT program, Support Group and [facility] outpatient pharmacy. All questions answered, will continue to monitor.  

## 2020-05-09 NOTE — Progress Notes (Signed)
Patient alert and oriented, Post op day 1.  Provided support and encouragement.  Encouraged pulmonary toilet, ambulation and small sips of liquids.  All questions answered.  Will continue to monitor. 

## 2020-05-10 LAB — CBC WITH DIFFERENTIAL/PLATELET
Abs Immature Granulocytes: 0.05 10*3/uL (ref 0.00–0.07)
Basophils Absolute: 0.1 10*3/uL (ref 0.0–0.1)
Basophils Relative: 0 %
Eosinophils Absolute: 0.1 10*3/uL (ref 0.0–0.5)
Eosinophils Relative: 1 %
HCT: 43.8 % (ref 36.0–46.0)
Hemoglobin: 15 g/dL (ref 12.0–15.0)
Immature Granulocytes: 0 %
Lymphocytes Relative: 26 %
Lymphs Abs: 3.3 10*3/uL (ref 0.7–4.0)
MCH: 28.7 pg (ref 26.0–34.0)
MCHC: 34.2 g/dL (ref 30.0–36.0)
MCV: 83.7 fL (ref 80.0–100.0)
Monocytes Absolute: 1.2 10*3/uL — ABNORMAL HIGH (ref 0.1–1.0)
Monocytes Relative: 9 %
Neutro Abs: 8 10*3/uL — ABNORMAL HIGH (ref 1.7–7.7)
Neutrophils Relative %: 64 %
Platelets: 281 10*3/uL (ref 150–400)
RBC: 5.23 MIL/uL — ABNORMAL HIGH (ref 3.87–5.11)
RDW: 14.2 % (ref 11.5–15.5)
WBC: 12.7 10*3/uL — ABNORMAL HIGH (ref 4.0–10.5)
nRBC: 0 % (ref 0.0–0.2)

## 2020-05-10 LAB — GLUCOSE, CAPILLARY
Glucose-Capillary: 121 mg/dL — ABNORMAL HIGH (ref 70–99)
Glucose-Capillary: 137 mg/dL — ABNORMAL HIGH (ref 70–99)
Glucose-Capillary: 155 mg/dL — ABNORMAL HIGH (ref 70–99)
Glucose-Capillary: 163 mg/dL — ABNORMAL HIGH (ref 70–99)

## 2020-05-10 MED ORDER — INSULIN LISPRO (1 UNIT DIAL) 100 UNIT/ML (KWIKPEN): 0.0000 [IU] | PEN_INJECTOR | Freq: Three times a day (TID) | SUBCUTANEOUS | 3 refills | Status: DC

## 2020-05-10 MED ORDER — ENOXAPARIN SODIUM 40 MG/0.4ML ~~LOC~~ SOLN: 40.0000 mg | SUBCUTANEOUS | 0 refills | Status: DC

## 2020-05-10 MED ORDER — ONDANSETRON 4 MG PO TBDP: 4.0000 mg | ORAL_TABLET | Freq: Four times a day (QID) | ORAL | 0 refills | Status: DC | PRN

## 2020-05-10 NOTE — Progress Notes (Signed)
24hr fluid recall: 540mL.  Per dehydration protocol, will call pt to f/u within one week post op. 

## 2020-05-10 NOTE — Progress Notes (Signed)
Discharge instructions discussed with patient, verbalized agreement and understanding 

## 2020-05-10 NOTE — Discharge Summary (Signed)
Physician Discharge Summary  [name]:[id number] DOB: October 01, 1965 DOA: 05/08/2020  PCP: [name], MD  Admit date: 05/08/2020 Discharge date: 05/10/2020  Recommendations for Outpatient Follow-up:  1.  (include homehealth, outpatient follow-up instructions, specific recommendations for PCP to follow-up on, etc.)   Follow-up Information    Jaan Fischel, Arta Bruce, MD. Go on 06/07/2020.   Specialty: General Surgery Why: at 9:10am. Please arrive 15 minutes prior to your appointment time.  Thank you. Contact information: [place] [address] [phone]        Surgery, [place]. Go on 07/05/2020.   Specialty: General Surgery Why: at 9:00am with Dr. Kieth Brightly.  Please arrive 15 minutes prior to your appointment time.  Thank you. Contact information: [place] [address] [phone]              Discharge Diagnoses:  Active Problems:   Morbid obesity ([place])   Surgical Procedure: laparoscopic sleeve gastrectomy, upper endoscopy  Discharge Condition: Good Disposition: Home  Diet recommendation: Postoperative sleeve gastrectomy diet (liquids only)  Filed Weights   05/08/20 0914  Weight: (!) 157.9 kg     Hospital Course:  The patient was admitted after undergoing laparoscopic sleeve gastrectomy. POD 0 she ambulated well. POD 1 she was started on the water diet protocol and tolerated 300 ml in the first shift. Once meeting the water amount she was advanced to bariatric protein shakes which they tolerated and were discharged home POD 2.  Treatments: surgery: laparoscopic sleeve gastrectomy  Discharge Instructions  Discharge Instructions    Ambulate hourly while awake   Complete by: As directed    Call MD for:  difficulty breathing, headache or visual disturbances   Complete by: As directed    Call MD for:  persistant dizziness or light-headedness   Complete by: As directed    Call MD for:   persistant nausea and vomiting   Complete by: As directed    Call MD for:  redness, tenderness, or signs of infection (pain, swelling, redness, odor or green/yellow discharge around incision site)   Complete by: As directed    Call MD for:  severe uncontrolled pain   Complete by: As directed    Call MD for:  temperature >101 F   Complete by: As directed    Diet bariatric full liquid   Complete by: As directed    Discharge wound care:   Complete by: As directed    Remove Bandaids tomorrow, ok to shower tomorrow. Steristrips may fall off in 1-3 weeks.   Incentive spirometry   Complete by: As directed    Perform hourly while awake     Allergies as of 05/10/2020      Reactions   Aspirin Other (See Comments)   Kidney disease   Methadone Hcl Other (See Comments)    "blacked out" in 1990's   Corticosteroids Other (See Comments)   Hyperglycemia   Levofloxacin Itching      Medication List    STOP taking these medications   Accu-Chek Softclix Lancets lancets   empagliflozin 25 MG Tabs tablet Commonly known as: JARDIANCE   furosemide 40 MG tablet Commonly known as: LASIX   HYDROcodone-acetaminophen 7.5-325 MG tablet Commonly known as: NORCO   Lantus SoloStar 100 UNIT/ML Solostar Pen Generic drug: insulin glargine   Potassium Chloride ER 20 MEQ Tbcr     TAKE these medications   Accu-Chek Aviva Plus test strip Generic drug: glucose blood 1 each  by Other route 3 (three) times daily. TEST 3 TIMES DAILY   acetaminophen 500 MG tablet Commonly known as: TYLENOL Take 1,000 mg by mouth 2 (two) times daily.   albuterol 108 (90 Base) MCG/ACT inhaler Commonly known as: VENTOLIN HFA Inhale 2 puffs into the lungs every 6 (six) hours as needed for wheezing or shortness of breath.   allopurinol 300 MG tablet Commonly known as: ZYLOPRIM Take 300 mg by mouth in the morning.   ammonium lactate 12 % lotion Commonly known as: LAC-HYDRIN APPLY TWICE A DAY AS NEEDED FOR DRY  SKIN What changed: See the new instructions.   atorvastatin 40 MG tablet Commonly known as: LIPITOR TAKE 1 TABLET BY MOUTH EVERY DAY What changed: when to take this   b complex vitamins capsule Take 1 capsule by mouth daily.   B-D UF III MINI PEN NEEDLES 31G X 5 MM Misc Generic drug: Insulin Pen Needle CHECK SUGARS 4 TIMES A DAY BEFORE MEALS AND AT BEDTIME.   baclofen 10 MG tablet Commonly known as: LIORESAL Take 1 tablet (10 mg total) by mouth 3 (three) times daily as needed for muscle spasms. What changed: when to take this   blood glucose meter kit and supplies Kit Dispense based on patient and insurance preference. Use up to four times daily as directed. (FOR ICD-9 250.00, 250.01).   busPIRone 10 MG tablet Commonly known as: BUSPAR Take 1 tablet (10 mg total) by mouth 3 (three) times daily.   diclofenac Sodium 1 % Gel Commonly known as: VOLTAREN APPLY 2 GRAMS TO AFFECTED AREA 4 TIMES A DAY   diltiazem 120 MG 24 hr capsule Commonly known as: CARDIZEM CD TAKE 1 CAPSULE (120 MG TOTAL) BY MOUTH 2 (TWO) TIMES DAILY. Notes to patient: Monitor Blood Pressure Daily and keep a log for primary care physician.  You may need to make changes to your medications with rapid weight loss.     docusate sodium 100 MG capsule Commonly known as: COLACE Take 200 mg by mouth 2 (two) times daily.   enoxaparin 40 MG/0.4ML injection Commonly known as: LOVENOX Inject 0.4 mLs (40 mg total) into the skin daily.   febuxostat 40 MG tablet Commonly known as: ULORIC Take 40 mg by mouth daily.   Fish Oil 1000 MG Caps Take 1,000 mg by mouth in the morning.   fluticasone 50 MCG/ACT nasal spray Commonly known as: FLONASE Place 2 sprays into both nostrils 2 (two) times daily.   gabapentin 300 MG capsule Commonly known as: NEURONTIN Take 2 capsules (600 mg total) by mouth 3 (three) times daily. TAKE 2 CAPSULES (600 MG TOTAL) BY MOUTH in the morning and at lunch and take 3 capsules at  night What changed: additional instructions   hydrOXYzine 10 MG tablet Commonly known as: ATARAX/VISTARIL Take 1 tablet (10 mg total) by mouth daily.   insulin lispro 100 UNIT/ML KwikPen Commonly known as: HUMALOG Inject 0-20 Units into the skin 3 (three) times daily before meals. CBG < 70: Drink protein shake or broth for hypoglycemia CBG 70 - 120: 0 units  CBG 121 - 150: 3 units  CBG 151 - 200: 4 units  CBG 201 - 250: 7 units  CBG 251 - 300: 11 units  CBG 301 - 350: 15 units  CBG 351 - 400: 20 units  CBG > 400 call MD What changed:   how much to take  additional instructions   ketoconazole 2 % cream Commonly known as: NIZORAL APPLY 1 FINGERTIP AMOUNT  TO EACH FOOT DAILY. What changed: See the new instructions.   losartan 100 MG tablet Commonly known as: COZAAR Take 1 tablet (100 mg total) by mouth daily. Notes to patient: Monitor Blood Pressure Daily and keep a log for primary care physician.  You may need to make changes to your medications with rapid weight loss.     metolazone 2.5 MG tablet Commonly known as: ZAROXOLYN Take 1 tablet (2.5 mg total) by mouth 2 (two) times a week. TAKE 1 TABLET TWICE WEEKLY ON MONDAYS AND THURSDAYS ONLY   montelukast 10 MG tablet Commonly known as: SINGULAIR TAKE 1 TABLET BY MOUTH EVERYDAY AT BEDTIME What changed: See the new instructions.   multivitamin with minerals tablet Take 1 tablet by mouth daily.   Narcan 4 MG/0.1ML Liqd nasal spray kit Generic drug: naloxone Place 1 spray into the nose as needed (accidental overdose).   NON FORMULARY Uses a C-PAP at bedtime   nystatin 100000 UNIT/ML suspension Commonly known as: MYCOSTATIN TAKE 5 MLS (500,000 UNITS TOTAL) BY MOUTH 4 (FOUR) TIMES DAILY. SWISH AND SWALLOW. What changed:   when to take this  reasons to take this   omeprazole 20 MG capsule Commonly known as: PRILOSEC TAKE 1 CAPSULE BY MOUTH EVERY DAY   ondansetron 4 MG disintegrating tablet Commonly known as:  ZOFRAN-ODT Take 1 tablet (4 mg total) by mouth every 6 (six) hours as needed for nausea or vomiting.   Ozempic (0.25 or 0.5 MG/DOSE) 2 MG/1.5ML Sopn Generic drug: Semaglutide(0.25 or 0.5MG/DOS) Inject 0.25 mg into the skin once a week. Minus 2 clicks What changed: when to take this   Trelegy Ellipta 100-62.5-25 MCG/INH Aepb Generic drug: Fluticasone-Umeclidin-Vilant Inhale 1 puff into the lungs daily.   varenicline 1 MG tablet Commonly known as: Chantix Continuing Month Pak Take 1 tablet (1 mg total) by mouth 2 (two) times daily.   venlafaxine XR 75 MG 24 hr capsule Commonly known as: EFFEXOR-XR TAKE 1 CAPSULE BY MOUTH DAILY WITH BREAKFAST.            Discharge Care Instructions  (From admission, onward)         Start     Ordered   05/10/20 0000  Discharge wound care:       Comments: Remove Bandaids tomorrow, ok to shower tomorrow. Steristrips may fall off in 1-3 weeks.   05/10/20 6962          Follow-up Information    Layza Summa, Arta Bruce, MD. Go on 06/07/2020.   Specialty: General Surgery Why: at 9:10am. Please arrive 15 minutes prior to your appointment time.  Thank you. Contact information: [place] [address] [phone]        Surgery, [place]. Go on 07/05/2020.   Specialty: General Surgery Why: at 9:00am with Dr. Kieth Brightly.  Please arrive 15 minutes prior to your appointment time.  Thank you. Contact information: [place] [address] [phone]                The results of significant diagnostics from this hospitalization (including imaging, microbiology, ancillary and laboratory) are listed below for reference.    Significant Diagnostic Studies: US Guided Needle Placement - No Linked Charges  Result Date: 04/27/2020 Ultrasound guided injection is preferred based studies that show increased duration, increased effect, greater accuracy, decreased procedural pain, increased  response rate, and decreased cost with ultrasound guided versus blind injection.   Verbal informed consent obtained.  Time-out conducted.  Noted no overlying erythema, induration, or other signs of local infection. Ultrasound-guided right hip injection: After sterile prep with Betadine, injected 4 cc 0.25% bupivacaine without epinephrine and 6 mg betamethasone using a 22-gauge spinal needle, passing the needle through the iliofemoral ligament into the femoral head/neck junction.      NCV with EMG (electromyography)  Result Date: 04/21/2020 Magnus Sinning, MD     04/24/2020 12:18 PM EMG & NCV Findings: Evaluation of the left ulnar motor nerve showed decreased conduction velocity (A Elbow-B Elbow, 29 m/s).  The left median (across palm) sensory nerve showed prolonged distal peak latency (3.8 ms).  The left ulnar sensory nerve showed reduced amplitude (14.6 V).  All remaining nerves (as indicated in the following tables) were within normal limits.  Needle evaluation of the left first dorsal interosseous muscle showed increased insertional activity, moderately increased spontaneous activity, and diminished recruitment.  All remaining muscles (as indicated in the following table) showed no evidence of electrical instability.  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 There is no significant electrodiagnostic evidence of any other focal nerve entrapment, brachial plexopathy or cervical radiculopathy. **The borderline slowing of the median nerve at the wrist along with being asymptomatic is likely temperature artifact. Recommendations: 1.  Follow-up with referring physician. 2.  Continue current management of symptoms. 3.  Suggest surgical evaluation. ___________________________ Laurence Spates FAAPMR Board Certified, American Board of Physical Medicine and Rehabilitation Nerve  Conduction Studies Anti Sensory Summary Table  Stim Site NR Peak (ms) Norm Peak (ms) P-T Amp (V) Norm P-T Amp Site1 Site2 Delta-P (ms) Dist (cm) Vel (m/s) Norm Vel (m/s) Left Median Acr Palm Anti Sensory (2nd Digit)  30.5C Wrist    *3.8 <3.6 11.6 >10       Site 3    1.8  0.2        Left Radial Anti Sensory (Base 1st Digit)  30.6C Wrist    2.2 <3.1 35.4  Wrist Base 1st Digit 2.2 0.0   Left Ulnar Anti Sensory (5th Digit)  30.9C Wrist    3.4 <3.7 *14.6 >15.0 Wrist 5th Digit 3.4 14.0 41 >38 Motor Summary Table  Stim Site NR Onset (ms) Norm Onset (ms) O-P Amp (mV) Norm O-P Amp Site1 Site2 Delta-0 (ms) Dist (cm) Vel (m/s) Norm Vel (m/s) Left Median Motor (Abd Poll Brev)  30.5C Wrist    3.8 <4.2 10.5 >5 Elbow Wrist 4.4 23.0 52 >50 Elbow    8.2  7.2        Left Ulnar Motor (Abd Dig Min)  30.2C Wrist    3.4 <4.2 7.6 >3 B Elbow Wrist 3.2 22.0 69 >53 B Elbow    6.6  7.4  A Elbow B Elbow 3.4 10.0 *29 >53 A Elbow    10.0  7.1        EMG  Side Muscle Nerve Root Ins Act Fibs Psw Amp Dur Poly Recrt Int Fraser Din Comment Left Abd Poll Brev Median C8-T1 _0  0 Nml Nml  Left 1stDorInt Ulnar C8-T1 *CRD *1+ *2+ Nml Nml 0 *Reduced Nml  Left PronatorTeres Median C6-7 _1  0 Nml Nml  Nerve Conduction Studies Anti Sensory Left/Right Comparison  Stim Site L Lat (ms) R Lat (ms) L-R Lat (ms) L Amp (V) R Amp (V) L-R Amp (%) Site1 Site2 L Vel (m/s) R Vel (m/s) L-R Vel (m/s)  Median Acr Palm Anti Sensory (2nd Digit)  30.5C Wrist *3.8   11.6        Site 3 1.8   0.2        Radial Anti Sensory (Base 1st Digit)  30.6C Wrist 2.2   35.4   Wrist Base 1st Digit    Ulnar Anti Sensory (5th Digit)  30.9C Wrist 3.4   *14.6   Wrist 5th Digit 41   Motor Left/Right Comparison  Stim Site L Lat (ms) R Lat (ms) L-R Lat (ms) L Amp (mV) R Amp (mV) L-R Amp (%) Site1 Site2 L Vel (m/s) R Vel (m/s) L-R Vel (m/s) Median Motor (Abd Poll Brev)  30.5C Wrist 3.8   10.5   Elbow Wrist 52   Elbow 8.2   7.2        Ulnar Motor (Abd Dig Min)   30.2C Wrist 3.4   7.6   B Elbow Wrist 69   B Elbow 6.6   7.4   A Elbow B Elbow *29   A Elbow 10.0   7.1        Waveforms:        DG HIP UNILAT WITH PELVIS 2-3 VIEWS RIGHT  Result Date: 05/09/2020 CLINICAL DATA:  Right hip pain EXAM: DG HIP (WITH OR WITHOUT PELVIS) 3V RIGHT COMPARISON:  None. FINDINGS: Pelvic ring is intact. Severe degenerative changes of the right hip joint are noted with subchondral sclerosis and cyst formation. Loss of the superior joint space is noted as well. Mild degenerative changes of left hip joint are seen. Postsurgical changes in the lower lumbar spine are noted. No soft tissue abnormality is seen. IMPRESSION: Severe degenerative changes of the right hip joint. Electronically Signed   By: Inez Catalina M.D.   On: 05/09/2020 14:52   CT Super D Chest Wo Contrast  Result Date: 04/25/2020 CLINICAL DATA:  RIGHT upper lobe nodule, no prior history of cancer by report. Follow-up in this 55 year old female. EXAM: CT CHEST WITHOUT CONTRAST TECHNIQUE: Multidetector CT imaging of the chest was performed using thin slice collimation for electromagnetic bronchoscopy planning purposes, without intravenous contrast. COMPARISON:  March 03, 2019 FINDINGS: Cardiovascular: Calcified coronary artery disease, three-vessel disease. Normal heart size. No pericardial effusion. Normal caliber thoracic aorta. Normal caliber central pulmonary vasculature. Limited assessment of cardiovascular structures given lack of intravenous contrast. Mediastinum/Nodes: Incompletely imaged LEFT thyroid lobe with suggestion of enlargement and heterogeneity, limited assessment due to streak artifact from adjacent hardware in the lower cervical and upper thoracic spine. This may measure as much as 3.9 x 2.8 cm. No thoracic inlet lymphadenopathy. No axillary lymphadenopathy. No mediastinal lymphadenopathy. No hilar lymphadenopathy. Esophagus grossly normal. Lungs/Pleura: RIGHT upper lobe pulmonary nodule, 8 x 8 mm,  previously approximately 8 x 6 mm. Bandlike changes in ground-glass extend to the pleural surface from the nodule in the RIGHT upper lobe. No effusion. No consolidation. Airways are patent. Mild RIGHT middle lobe scarring is similar to previous imaging. This is Upper Abdomen: Incidental imaging of upper abdominal contents without acute process. Musculoskeletal: Signs of lower cervical and upper thoracic spinal fusion and spinal degenerative changes as before. IMPRESSION: 1. RIGHT upper lobe pulmonary nodule, 8 x 8 mm, previously approximately 8 x 6 mm. Mean diameter is similar. Benign process versus indolent neoplasm based on imaging appearance as on prior studies, continued interval surveillance is suggested at 1 year interval. 2. Incompletely imaged LEFT thyroid lobe with suggestion of enlargement and heterogeneity, limited assessment due to streak artifact from adjacent  hardware in the lower cervical and upper thoracic spine. This may measure as much is 3.9 x 2.8 cm. Recommend thyroid ultrasound (ref: J Am Coll Radiol. 2015 Feb;12(2): 143-50). 3. Calcified coronary artery disease, three-vessel disease. 4. Aortic atherosclerosis. Aortic Atherosclerosis (ICD10-I70.0). Electronically Signed   By: Zetta Bills M.D.   On: 04/25/2020 16:08    Labs: Basic Metabolic Panel: Recent Labs  Lab 05/09/20 0324  NA 136  K 3.3*  CL 95*  CO2 29  GLUCOSE 157*  BUN 33*  CREATININE 1.19*  CALCIUM 9.3   Liver Function Tests: Recent Labs  Lab 05/09/20 0324  AST 33  ALT 38  ALKPHOS 77  BILITOT 0.4  PROT 6.8  ALBUMIN 3.7    CBC: Recent Labs  Lab 05/08/20 1648 05/09/20 0324 05/09/20 1436 05/10/20 0418  WBC  --  15.2* 13.1* 12.7*  NEUTROABS  --  11.8*  --  8.0*  HGB 17.1* 15.6* 15.0 15.0  HCT 49.8* 45.0 43.6 43.8  MCV  --  82.7 82.9 83.7  PLT  --  312 283 281    CBG: Recent Labs  Lab 05/09/20 1538 05/09/20 2000 05/10/20 0007 05/10/20 0442 05/10/20 0812  GLUCAP 140* 166* 155* 137* 163*     Active Problems:   Morbid obesity ([place])   VTE plan: I will prescribe outpatient chemical prophylaxis of enoxaparin due to this increased risk ([email or web address])  Time coordinating discharge: 15 min

## 2020-05-10 NOTE — Progress Notes (Signed)
Patient alert and oriented, Post op day 2.  Provided support and encouragement.  Encouraged pulmonary toilet, ambulation and small sips of liquids.  All questions answered.  Will continue to monitor. 

## 2020-05-15 ENCOUNTER — Other Ambulatory Visit (HOSPITAL_COMMUNITY): Payer: Self-pay

## 2020-05-15 ENCOUNTER — Telehealth (HOSPITAL_COMMUNITY): Payer: Self-pay | Admitting: *Deleted

## 2020-05-15 NOTE — Telephone Encounter (Signed)
1.  Tell me about your pain and pain management? Pt denies any pain.   2.  Let's talk about fluid intake.  How much total fluid are you taking in? Pt states that s/he is getting in at least 64oz of fluid including protein shakes, bottled water, Powerade, and Nature's Twist.   3.  How much protein have you taken in the last 2 days? Pt states she is meeting her goal of 60g of protein each day with the protein shakes.   4.  Have you had nausea?  Tell me about when have experienced nausea and what you did to help? Pt denies nausea.   5.  Has the frequency or color changed with your urine? Pt states that she is urinating "fine" with no changes in frequency or urgency.     6.  Tell me what your incisions look like? "Incisions look fine". Pt denies a fever, chills.  Pt states incisions are not swollen, open, or draining.  Pt encouraged to call [facility] if incisions change.   7.  Have you been passing gas? BM? Pt states that she is having BMs. Last BM 05/15/20.     8.  If a problem or question were to arise who would you call?  Do you know contact numbers for [facility], [facility], and [facility]? Pt denies dehydration symptoms.  Pt can describe s/sx of dehydration.  Pt knows to call [facility] for surgical, [facility] for nutrition, and [facility] for non-urgent questions or concerns.   9.  How has the walking going? Pt states she is walking around and able to be active without difficulty.   10.  How are your vitamins and calcium going?  How are you taking them? Pt states that she is taking her supplements and vitamins without difficulty. Pt education reinforced that only 500mg  of calcium is needed at at time to meet goal of 1200-1500mg  daily.  Reminded patient that the first 30 days post-operatively are important for successful recovery.  Practice good hand hygiene, wearing a mask when appropriate (since optional in most places), and minimizing exposure to people who live outside of the home, especially if they are exhibiting any  respiratory, GI, or illness-like symptoms.

## 2020-05-17 ENCOUNTER — Ambulatory Visit: Payer: 59 | Admitting: Podiatry

## 2020-05-19 ENCOUNTER — Other Ambulatory Visit: Payer: Self-pay

## 2020-05-19 ENCOUNTER — Encounter: Payer: Self-pay | Admitting: Podiatry

## 2020-05-19 ENCOUNTER — Ambulatory Visit (INDEPENDENT_AMBULATORY_CARE_PROVIDER_SITE_OTHER): Payer: 59 | Admitting: Specialist

## 2020-05-19 ENCOUNTER — Ambulatory Visit (INDEPENDENT_AMBULATORY_CARE_PROVIDER_SITE_OTHER): Payer: 59 | Admitting: Podiatry

## 2020-05-19 ENCOUNTER — Encounter: Payer: Self-pay | Admitting: Specialist

## 2020-05-19 VITALS — BP 99/70 | HR 85 | Ht 69.0 in | Wt 348.0 lb

## 2020-05-19 DIAGNOSIS — M961 Postlaminectomy syndrome, not elsewhere classified: Secondary | ICD-10-CM

## 2020-05-19 DIAGNOSIS — M48062 Spinal stenosis, lumbar region with neurogenic claudication: Secondary | ICD-10-CM | POA: Diagnosis not present

## 2020-05-19 DIAGNOSIS — M79674 Pain in right toe(s): Secondary | ICD-10-CM

## 2020-05-19 DIAGNOSIS — E1142 Type 2 diabetes mellitus with diabetic polyneuropathy: Secondary | ICD-10-CM

## 2020-05-19 DIAGNOSIS — N184 Chronic kidney disease, stage 4 (severe): Secondary | ICD-10-CM | POA: Diagnosis not present

## 2020-05-19 DIAGNOSIS — M79675 Pain in left toe(s): Secondary | ICD-10-CM

## 2020-05-19 DIAGNOSIS — M7542 Impingement syndrome of left shoulder: Secondary | ICD-10-CM

## 2020-05-19 DIAGNOSIS — Z981 Arthrodesis status: Secondary | ICD-10-CM | POA: Diagnosis not present

## 2020-05-19 DIAGNOSIS — M1712 Unilateral primary osteoarthritis, left knee: Secondary | ICD-10-CM

## 2020-05-19 DIAGNOSIS — M25512 Pain in left shoulder: Secondary | ICD-10-CM

## 2020-05-19 DIAGNOSIS — G8929 Other chronic pain: Secondary | ICD-10-CM

## 2020-05-19 DIAGNOSIS — B351 Tinea unguium: Secondary | ICD-10-CM

## 2020-05-19 DIAGNOSIS — M4322 Fusion of spine, cervical region: Secondary | ICD-10-CM

## 2020-05-19 NOTE — Progress Notes (Signed)
Office Visit Note   Patient: [name]           Date of Birth: 1965-05-25           MRN: [id number] Visit Date: 05/19/2020              Requested by: [name], MD [address],  [place] [address] PCP: [name], MD   Assessment & Plan: Visit Diagnoses:  1. Spinal stenosis of lumbar region with neurogenic claudication   2. S/P lumbar fusion   3. Primary osteoarthritis of left knee   4. Chronic left shoulder pain   5. Fusion of spine of cervical region   6. Impingement syndrome of left shoulder   7. Cervical post-laminectomy syndrome   8. History of fusion of lumbar spine     Plan: Avoid bending, stooping and avoid lifting weights greater than 10 lbs. Avoid prolong standing and walking. Avoid frequent bending and stooping  No lifting greater than 10 lbs. May use ice or moist heat for pain. Weight loss is of benefit. Handicap license is approved.   Follow-Up Instructions: No follow-ups on file.   Orders:  No orders of the defined types were placed in this encounter.  No orders of the defined types were placed in this encounter.     Procedures: No procedures performed   Clinical Data: No additional findings.   Subjective: Chief Complaint  Patient presents with  . Lower Back - Follow-up    56 year old female with history knee osteoarthritis and lumbar degenerative disc disease and previous lumbar fusion surgery. She likely will eventually need TKRs and lumbar surgery with extension of her fusion. She is in good spirits today as she has completed a gastric sleeve surgery 05/08/2020. She is optimistic that this intervention will decrease her diabetes and HTN and make it so that intervention surgically is a realistic alternative to having to live with chronic arthritites and pain due to lumbar spondylosis and degenerative disc disease. No bowel or bladder difficulty. She is using a walker with a seat and breaks.    Review of Systems   Constitutional: Negative.   HENT: Negative.   Eyes: Negative.   Respiratory: Negative.   Cardiovascular: Negative.   Gastrointestinal: Negative.   Endocrine: Negative.   Genitourinary: Negative.   Musculoskeletal: Negative.   Skin: Negative.   Allergic/Immunologic: Negative.   Neurological: Negative.   Hematological: Negative.   Psychiatric/Behavioral: Negative.      Objective: Vital Signs: BP 99/70 (BP Location: Left Arm, Patient Position: Sitting)   Pulse 85   Ht 5\' 9"  (1.753 m)   Wt (!) 348 lb (157.9 kg)   LMP 04/10/2006   BMI 51.39 kg/m   Physical Exam Constitutional:      Appearance: She is well-developed.  HENT:     Head: Normocephalic and atraumatic.  Eyes:     Pupils: Pupils are equal, round, and reactive to light.  Pulmonary:     Effort: Pulmonary effort is normal.     Breath sounds: Normal breath sounds.  Abdominal:     General: Bowel sounds are normal.     Palpations: Abdomen is soft.  Musculoskeletal:        General: Normal range of motion.     Cervical back: Normal range of motion and neck supple.     Lumbar back: Negative right straight leg raise test and negative left straight leg raise test.  Skin:    General: Skin is warm and  dry.  Neurological:     Mental Status: She is alert and oriented to person, place, and time.  Psychiatric:        Behavior: Behavior normal.        Thought Content: Thought content normal.        Judgment: Judgment normal.     Back Exam   Tenderness  The patient is experiencing tenderness in the lumbar.  Range of Motion  Extension: normal  Flexion: normal  Lateral bend right: normal  Lateral bend left: normal  Rotation right: normal  Rotation left: normal   Muscle Strength  Right Quadriceps:  5/5  Left Quadriceps:  5/5  Right Hamstrings:  5/5  Left Hamstrings:  5/5   Tests  Straight leg raise right: negative Straight leg raise left: negative  Other  Toe walk: normal Heel walk:  normal      Specialty Comments:  No specialty comments available.  Imaging: No results found.   PMFS History: Patient Active Problem List   Diagnosis Date Noted  . Cubital tunnel syndrome on left 05/02/2020  . Vaginal discharge 01/20/2020  . Prolapse of anterior vaginal wall 11/10/2019  . Skin ulcer of left thigh, limited to breakdown of skin ([place]) 09/09/2019  . Ulnar nerve compression 08/06/2019  . Diabetic neuropathy associated with type 2 diabetes mellitus ([place]) 08/06/2019  . Body mass index 50.0-59.9, adult ([place]) 06/12/2019  . Impingement syndrome of left shoulder 05/07/2019  . Polyp of colon   . Polyp of ascending colon   . Rectal discomfort 02/12/2019  . Osteoarthritis of right hip 02/12/2019  . Nontraumatic tear of left supraspinatus tendon 12/30/2018  . Gout 12/17/2018  . Nontraumatic complete tear of right rotator cuff 12/08/2018  . Rotator cuff tear, right 12/01/2018  . Tobacco abuse 08/21/2018  . Right groin pain 07/20/2018  . Depressed mood 07/13/2018  . Paroxysmal SVT (supraventricular tachycardia) ([place]) 03/20/2018  . Pure hypercholesterolemia 03/20/2018  . Muscle cramping 02/25/2018  . Nodule of upper lobe of right lung 02/13/2018  . Falls, subsequent encounter 01/30/2018  . Pain in left foot 11/04/2017  . Essential hypertension 10/27/2017  . Solitary pulmonary nodule 10/07/2017  . Restrictive lung disease secondary to obesity 10/01/2017  . Polycythemia, secondary 10/01/2017  . Chronic viral hepatitis B without delta-agent ([place]) 07/08/2017  . COPD (chronic obstructive pulmonary disease) with chronic bronchitis ([place]) 06/27/2017  . Chronic kidney disease (CKD), stage IV (severe) ([place]) 01/14/2017  . Type 2 diabetes mellitus with stage 3 chronic kidney disease, with long-term current use of insulin ([place]) 12/12/2016  . Urge incontinence of urine 12/05/2016  . Trigger finger, left index finger 07/15/2016  . Back pain 04/07/2014  . Morbid obesity ([place])  10/02/2012  . Chronic diastolic CHF (congestive heart failure) ([place]) 04/07/2012  . GERD 01/26/2010  . Obstructive sleep apnea treated with BiPAP 02/03/2008  . FIBROCYSTIC BREAST DISEASE 10/28/2006  . Hyperlipidemia 03/27/2006  . Obesity hypoventilation syndrome ([place]) 03/27/2006  . Depression with anxiety 03/27/2006   Past Medical History:  Diagnosis Date  . Alcoholism ([place])   . Anxiety   . Arthritis 04-10-11   hips, shoulders, back  . Asthma   . Bipolar disorder ([place])   . Cardiomegaly   . Carpal tunnel syndrome, bilateral   . Cervical cancer ([place]) 1993   cervical, no treatment done, went away per pt  . Cervical dysplasia or atypia 04-10-11   '93- once dx.-got pregnant-no intervention, then postpartum, no dysplasia found  . CHF (congestive heart failure) ([place])  no cardiologist 2014 dx, none now  . Chronic hypoxemic respiratory failure ([place])   . Chronic kidney disease    stage 3 kidney disease  . 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 Pathology showed Condyloma, gluteal CONDYLOMA ACUMINATUM.   [name] Kitchen COPD (chronic obstructive pulmonary disease) ([place])   . Depression   . Diabetes mellitus without complication ([place])   . Dyspnea    with actity, sitting  . Fall 12/16/2016  . Fibrocystic breast disease   . GERD (gastroesophageal reflux disease)   . Grade I diastolic dysfunction 46/50/3546   Noted on ECHO  . Hepatitis    hep B-count is low at present,doesn't register  . History of PSVT (paroxysmal supraventricular tachycardia)   . Hypercalcemia   . Hyperlipidemia   . Hypertension   . Incomplete left bundle branch block (LBBB) 09/24/2018   Noted on EKG  . Lung nodule 11/2017   right and left lung  . Migraine headache    none recent  . Morbid obesity ([place]) 03/27/2006  . Neuropathy   . Pneumonia    walking pneu 15 yrs. ago  . Skin lesion 03/15/2011   In gluteal crease now s/p removal by Dr. [name] of General Surgery on 3/12. Path shows condyloma.     .  Sleep apnea 04-10-11   uses cpap, pt does not know settings  . Trigger finger    left third  . Urge incontinence of urine     Family History  Problem Relation Age of Onset  . Pulmonary embolism Mother   . Stroke Father   . Alcohol abuse Father   . Pneumonia Father   . Hypertension Father   . Breast cancer Maternal Aunt   . Hypertension Other   . Diabetes Other        Aunts and cousins  . Asthma Sister   . Depression Sister   . Arthritis Sister   . Sickle cell anemia Son     Past Surgical History:  Procedure Laterality Date  . ANAL FISTULECTOMY  04/17/2011   Procedure: FISTULECTOMY ANAL;  Surgeon: Imogene Burn. [name], MD;  Location: [facility];  Service: General;  Laterality: N/A;  Excision of Condyloma Gluteal Cleft   . ANAL RECTAL MANOMETRY N/A 03/26/2019   Procedure: ANO RECTAL MANOMETRY;  Surgeon: Thornton Park, MD;  [facility];  Service: Gastroenterology;  Laterality: N/A;  . BIOPSY  03/09/2019   Procedure: BIOPSY;  Surgeon: Thornton Park, MD;  [facility];  Service: Gastroenterology;;  . Lum [place] INJECTION N/A 12/29/2018   Procedure: BOTOX INJECTION(100 UNITS)  WITH CYSTOSCOPY;  Surgeon: Ceasar Mons, MD;  [facility];  Service: Urology;  Laterality: N/A;  . BOTOX INJECTION N/A 07/27/2019   Procedure: BOTOX INJECTION WITH CYSTOSCOPY;  Surgeon: Ceasar Mons, MD;  [facility];  Service: Urology;  Laterality: N/A;  ONLY NEEDS 30 MIN  . BREAST CYST EXCISION     bilateral breast, 3 cysts removed from each breast  . BREAST EXCISIONAL BIOPSY Right    x 3  . BREAST EXCISIONAL BIOPSY Left    x 3  . CARPAL TUNNEL RELEASE Bilateral   . Cervical biospy    . CERVICAL FUSION  04-10-11   x3- cervical fusion with plating and screws-Dr. [name]  . COLONOSCOPY WITH PROPOFOL N/A 07/06/2015   Procedure: COLONOSCOPY WITH PROPOFOL;  Surgeon: [name], MD;  [facility];  Service: Endoscopy;  Laterality: N/A;  . COLONOSCOPY WITH  PROPOFOL N/A 03/09/2019  Procedure: COLONOSCOPY WITH PROPOFOL;  Surgeon: Thornton Park, MD;  [facility];  Service: Gastroenterology;  Laterality: N/A;  . DOPPLER ECHOCARDIOGRAPHY  04/06/2012   AT [facility] 55-60%  . FOOT SURGERY Left 2018  . FRACTURE SURGERY     little toe left foot  . I & D EXTREMITY Left 12/18/2016   Procedure: IRRIGATION AND DEBRIDEMENT LEFT FOOT, CLOSURE;  Surgeon: [name], MD;  Location: [facility];  Service: Orthopedics;  Laterality: Left;  . LAPAROSCOPIC GASTRIC SLEEVE RESECTION N/A 05/08/2020   Procedure: LAPAROSCOPIC GASTRIC SLEEVE RESECTION;  Surgeon: Kieth Brightly Arta Bruce, MD;  [facility];  Service: General;  Laterality: N/A;  . LUMBAR [place] SURGERY  04-10-11   x5-Lumbar fusion-retained hardware.(Dr. Louanne Skye)  . MULTIPLE TOOTH EXTRACTIONS    . POLYPECTOMY  03/09/2019   Procedure: POLYPECTOMY;  Surgeon: Thornton Park, MD;  [facility];  Service: Gastroenterology;;  . SHOULDER ARTHROSCOPY WITH ROTATOR CUFF REPAIR AND SUBACROMIAL DECOMPRESSION Left 05/07/2019   Procedure: LEFT SHOULDER ARTHROSCOPY WITH DEBRIDEMENT, DISTAL CLAVICLE EXCISION,  SUBACROMIAL DECOMPRESSION AND POSSIBLE ROTATOR CUFF REPAIR;  Surgeon: [name], MD;  Location: [facility];  Service: Orthopedics;  Laterality: Left;  . TEE WITHOUT CARDIOVERSION N/A 04/06/2012   Procedure: TRANSESOPHAGEAL ECHOCARDIOGRAM (TEE);  Surgeon: [name], MD;  [facility];  Service: Cardiovascular;  Laterality: N/A;  . TRIGGER FINGER RELEASE Right    middle finger  . TRIGGER FINGER RELEASE Left 06/28/2016   Procedure: RELEASE TRIGGER FINGER LEFT 3RD FINGER;  Surgeon: [name], MD;  Location: [facility];  Service: Orthopedics;  Laterality: Left;  . TRIGGER FINGER RELEASE Right 06/12/2016   Procedure: RIGHT INDEX FINGER TRIGGER RELEASE;  Surgeon: [name], MD;  Location: [facility];  Service: Orthopedics;  Laterality: Right;  . TRIGGER FINGER RELEASE Right 01/19/2018    Procedure: RIGHT RING FINGER TRIGGER FINGER RELEASE;  Surgeon: [name], MD;  Location: [facility];  Service: Orthopedics;  Laterality: Right;  . TRIGGER FINGER RELEASE Left 05/07/2019   Procedure: RELEASE TRIGGER FINGER LEFT INDEX FINGER;  Surgeon: [name], MD;  Location: [facility];  Service: Orthopedics;  Laterality: Left;  . UPPER GI ENDOSCOPY N/A 05/08/2020   Procedure: UPPER GI ENDOSCOPY;  Surgeon: Kieth Brightly, Arta Bruce, MD;  [facility];  Service: General;  Laterality: N/A;   Social History   Occupational History  . Occupation: disabled    Comment: Disabled  Tobacco Use  . Smoking status: Former Smoker    Packs/day: 0.20    Years: 40.00    Pack years: 8.00    Types: Cigarettes    Start date: 01/28/1977    Quit date: 07/25/2019    Years since quitting: 0.8  . Smokeless tobacco: Never Used  . Tobacco comment: 4-5 cigarettes per day  Vaping Use  . Vaping Use: Never used  Substance and Sexual Activity  . Alcohol use: No    Alcohol/week: 0.0 standard drinks  . Drug use: Not Currently    Types: Marijuana, "Crack" cocaine    Comment: hx marijuana usemany years ago, Crack -none since 11/2015  . Sexual activity: Not Currently    Partners: Male    Birth control/protection: Post-menopausal

## 2020-05-19 NOTE — Patient Instructions (Signed)
With weight loss and gastric sleeve bypass she is a candidate then to consider knee replacement and thoracolumbar decompression and fusion to treat her conditions. I am pleased that she is does this and  I am optimistic that she can succeed in improving her longevity and decrease her risk of having failed intervention concerning her knees and her back. Avoid bending, stooping and avoid lifting weights greater than 10 lbs. Avoid prolong standing and walking. Avoid frequent bending and stooping  No lifting greater than 10 lbs. May use ice or moist heat for pain. Weight loss is of benefit. Handicap license is approved.

## 2020-05-19 NOTE — Progress Notes (Signed)
This patient returns to my office for at risk foot care.  This patient requires this care by a professional since this patient will be at risk due to having chronic kidney disease and type 2 diabetes.  This patient is unable to cut nails herself since the patient cannot reach her nails.These nails are painful walking and wearing shoes.  This patient presents for at risk foot care today.  General Appearance  Alert, conversant and in no acute stress.  Vascular  Dorsalis pedis and posterior tibial  pulses are palpable  bilaterally.  Capillary return is within normal limits  bilaterally. Temperature is within normal limits  bilaterally.  Neurologic  Senn-Weinstein monofilament wire test diminished  bilaterally. Muscle power within normal limits bilaterally.  Nails Thick disfigured discolored nails with subungual debris  from hallux to fifth toes bilaterally. No evidence of bacterial infection or drainage bilaterally.  Orthopedic  No limitations of motion  feet .  No crepitus or effusions noted.  No bony pathology or digital deformities noted.  HAV  Left foot.  Skin  normotropic skin with no porokeratosis noted bilaterally.  No signs of infections or ulcers noted.   Thickened hyperkeratotic tissue  right foot plantarly.  Onychomycosis  Pain in right toes  Pain in left toes  Consent was obtained for treatment procedures.   Mechanical debridement of nails 1-5  bilaterally performed with a nail nipper.  Filed with dremel without incident.    Return office visit   9 weeks                 Told patient to return for periodic foot care and evaluation due to potential at risk complications.   Gardiner Barefoot DPM

## 2020-05-22 ENCOUNTER — Telehealth: Payer: Self-pay | Admitting: Pharmacist

## 2020-05-22 MED ORDER — VARENICLINE TARTRATE 1 MG PO TABS: 1.0000 mg | ORAL_TABLET | Freq: Two times a day (BID) | ORAL | 3 refills | Status: DC

## 2020-05-22 NOTE — Telephone Encounter (Signed)
Patient called in follow- up of tobacco cessation on 3/22.   Patient also reports successful gastric sleeve procedure 2 weeks ago.  She is continuing on liquid diet plan currently.   She requested continuation of varenicline 1mg  BID which has helped her in the past.  New prescription provided with refills.    I plan to follow-up with her in 2 weeks.

## 2020-05-22 NOTE — Telephone Encounter (Signed)
-----   Message from Leavy Cella, [place] sent at 04/21/2020 10:19 AM EDT ----- Regarding: Tobacco Cessaiton - Gastric Sleave

## 2020-05-23 ENCOUNTER — Telehealth: Payer: Self-pay | Admitting: Licensed Clinical Social Worker

## 2020-05-23 ENCOUNTER — Ambulatory Visit: Payer: 59 | Admitting: Licensed Clinical Social Worker

## 2020-05-23 ENCOUNTER — Other Ambulatory Visit: Payer: Self-pay

## 2020-05-23 ENCOUNTER — Encounter: Payer: 59 | Attending: General Surgery | Admitting: Skilled Nursing Facility1

## 2020-05-23 DIAGNOSIS — N184 Chronic kidney disease, stage 4 (severe): Secondary | ICD-10-CM | POA: Insufficient documentation

## 2020-05-23 DIAGNOSIS — Z794 Long term (current) use of insulin: Secondary | ICD-10-CM | POA: Insufficient documentation

## 2020-05-23 DIAGNOSIS — N183 Chronic kidney disease, stage 3 unspecified: Secondary | ICD-10-CM | POA: Diagnosis present

## 2020-05-23 DIAGNOSIS — Z741 Need for assistance with personal care: Secondary | ICD-10-CM

## 2020-05-23 DIAGNOSIS — E1122 Type 2 diabetes mellitus with diabetic chronic kidney disease: Secondary | ICD-10-CM | POA: Insufficient documentation

## 2020-05-23 NOTE — Patient Instructions (Signed)
  [name]  it was nice speaking with you. Please call me directly if you have questions about the goals we discussed. Goals Addressed            This Visit's Progress   . COMPLETED: Personal Care Services        Patient Goals/Self-Care Activities:  . Call [facility] to scheduled your assessment [phone] or [phone]       Patient verbalizes understanding of instructions provided today.    Follow up plan: SW will follow up with patient by phone over the next 90 days  [name], [place]

## 2020-05-23 NOTE — Chronic Care Management (AMB) (Signed)
Chronic Care Management    Clinical Social Work Note  0/34/7425 Name: [name] MRN: [id number] DOB: [birth year]  [name] is a 55 y.o. year old female who is a primary care patient of [name], MD. The CCM team was consulted to assist the patient with chronic disease management and/or care coordination needs related to: Level of Care Concerns.   Engaged with patient by telephone for follow up visit in response to provider referral for social work chronic care management and care coordination services.   Consent to Services:  The patient was given information about Chronic Care Management services, agreed to services, and gave verbal consent prior to initiation of services.  Please see initial visit note for detailed documentation.   Patient agreed to services and consent obtained.   Assessment: Patient is engaged in conversation, continues to maintain positive progress with care plan goals. no new goals established today.  All current goals completed. See Care Plan below for interventions and patient self-care actives. Recent life changes /stressors: recent surgery and doing well.  Has increased [name] hours to meet ADL's  Follow up Plan: Patient would like continued follow-up.  CCM LCSW will follow up with patient in 90 days.. Patient will call office if needed prior to next encounter.   Review of patient past medical history, allergies, medications, and health status, including review of relevant consultants reports was performed today as part of a comprehensive evaluation and provision of chronic care management and care coordination services.     SDOH (Social Determinants of Health) assessments and interventions performed:    Advanced Directives Status: See Vynca application for related entries.  CCM Care Plan  Allergies  Allergen Reactions  . Aspirin Other (See Comments)    Kidney disease  . 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  Corticosteroids Other (See Comments)    Hyperglycemia   . Levofloxacin Itching    Outpatient Encounter Medications as of 05/23/2020  Medication Sig Note  . acetaminophen (TYLENOL) 500 MG tablet Take 1,000 mg by mouth 2 (two) times daily.   [name] Kitchen albuterol (VENTOLIN HFA) 108 (90 Base) MCG/ACT inhaler Inhale 2 puffs into the lungs every 6 (six) hours as needed for wheezing or shortness of breath.   . allopurinol (ZYLOPRIM) 300 MG tablet Take 300 mg by mouth in the morning.   [name] Kitchen ammonium lactate (LAC-HYDRIN) 12 % lotion APPLY TWICE A DAY AS NEEDED FOR DRY SKIN (Patient taking differently: Apply 1 application topically in the morning and at bedtime.)   . atorvastatin (LIPITOR) 40 MG tablet TAKE 1 TABLET BY MOUTH EVERY DAY (Patient taking differently: Take 40 mg by mouth in the morning.)   . b complex vitamins capsule Take 1 capsule by mouth daily.   . B-D UF III MINI PEN NEEDLES 31G X 5 MM MISC CHECK SUGARS 4 TIMES A DAY BEFORE MEALS AND AT BEDTIME.   . baclofen (LIORESAL) 10 MG tablet Take 1 tablet (10 mg total) by mouth 3 (three) times daily as needed for muscle spasms. (Patient taking differently: Take 10 mg by mouth 3 (three) times daily.)   . blood glucose meter kit and supplies KIT Dispense based on patient and insurance preference. Use up to four times daily as directed. (FOR ICD-9 250.00, 250.01).   . busPIRone (BUSPAR) 10 MG tablet Take 1 tablet (10 mg total) by mouth 3 (three) times daily.   . diclofenac Sodium (VOLTAREN) 1 % GEL APPLY 2  GRAMS TO AFFECTED AREA 4 TIMES A DAY   . diltiazem (CARDIZEM CD) 120 MG 24 hr capsule TAKE 1 CAPSULE (120 MG TOTAL) BY MOUTH 2 (TWO) TIMES DAILY.   [name] Kitchen docusate sodium (COLACE) 100 MG capsule Take 200 mg by mouth 2 (two) times daily.   [name] Kitchen enoxaparin (LOVENOX) 40 MG/0.4ML injection Inject 0.4 mLs (40 mg total) into the skin daily.   . febuxostat (ULORIC) 40 MG tablet Take 40 mg by mouth daily.   . fluticasone (FLONASE) 50 MCG/ACT nasal spray Place 2 sprays into  both nostrils 2 (two) times daily.   . Fluticasone-Umeclidin-Vilant (TRELEGY ELLIPTA) 100-62.5-25 MCG/INH AEPB Inhale 1 puff into the lungs daily.   [name] Kitchen gabapentin (NEURONTIN) 300 MG capsule Take 2 capsules (600 mg total) by mouth 3 (three) times daily. TAKE 2 CAPSULES (600 MG TOTAL) BY MOUTH in the morning and at lunch and take 3 capsules at night (Patient taking differently: Take 600 mg by mouth 3 (three) times daily.)   . glucose blood (ACCU-CHEK AVIVA PLUS) test strip 1 each by Other route 3 (three) times daily. TEST 3 TIMES DAILY   . hydrOXYzine (ATARAX/VISTARIL) 10 MG tablet Take 1 tablet (10 mg total) by mouth daily.   . insulin lispro (HUMALOG) 100 UNIT/ML KwikPen Inject 0-20 Units into the skin 3 (three) times daily before meals. CBG < 70: Drink protein shake or broth for hypoglycemia CBG 70 - 120: 0 units  CBG 121 - 150: 3 units  CBG 151 - 200: 4 units  CBG 201 - 250: 7 units  CBG 251 - 300: 11 units  CBG 301 - 350: 15 units  CBG 351 - 400: 20 units  CBG > 400 call MD   . ketoconazole (NIZORAL) 2 % cream APPLY 1 FINGERTIP AMOUNT TO EACH FOOT DAILY. (Patient taking differently: Apply 1 application topically in the morning, at noon, and at bedtime.)   . losartan (COZAAR) 100 MG tablet Take 1 tablet (100 mg total) by mouth daily.   . metolazone (ZAROXOLYN) 2.5 MG tablet Take 1 tablet (2.5 mg total) by mouth 2 (two) times a week. TAKE 1 TABLET TWICE WEEKLY ON MONDAYS AND THURSDAYS ONLY   . montelukast (SINGULAIR) 10 MG tablet TAKE 1 TABLET BY MOUTH EVERYDAY AT BEDTIME (Patient taking differently: Take 10 mg by mouth at bedtime.)   . Multiple Vitamins-Minerals (MULTIVITAMIN WITH MINERALS) tablet Take 1 tablet by mouth daily.   [name] Kitchen NARCAN 4 MG/0.1ML LIQD nasal spray kit Place 1 spray into the nose as needed (accidental overdose). 05/08/2020: never  . NON FORMULARY Uses a C-PAP at bedtime   . nystatin (MYCOSTATIN) 100000 UNIT/ML suspension TAKE 5 MLS (500,000 UNITS TOTAL) BY MOUTH 4 (FOUR) TIMES  DAILY. SWISH AND SWALLOW. (Patient taking differently: Take 5 mLs by mouth 4 (four) times daily as needed (thrush). Swish and swallow.)   . Omega-3 Fatty Acids (FISH OIL) 1000 MG CAPS Take 1,000 mg by mouth in the morning.   [name] Kitchen omeprazole (PRILOSEC) 20 MG capsule TAKE 1 CAPSULE BY MOUTH EVERY DAY   . ondansetron (ZOFRAN-ODT) 4 MG disintegrating tablet Take 1 tablet (4 mg total) by mouth every 6 (six) hours as needed for nausea or vomiting.   . Semaglutide,0.25 or 0.5MG/DOS, (OZEMPIC, 0.25 OR 0.5 MG/DOSE,) 2 MG/1.5ML SOPN Inject 0.25 mg into the skin once a week. Minus 2 clicks (Patient taking differently: Inject 0.25 mg into the skin every Thursday. Minus 2 clicks)   . varenicline (CHANTIX) 1 MG tablet Take 1 tablet (1  mg total) by mouth 2 (two) times daily.   [name] Kitchen venlafaxine XR (EFFEXOR-XR) 75 MG 24 hr capsule TAKE 1 CAPSULE BY MOUTH DAILY WITH BREAKFAST. (Patient taking differently: Take 75 mg by mouth daily with breakfast.)    Facility-Administered Encounter Medications as of 05/23/2020  Medication  . technetium tetrofosmin (TC-MYOVIEW) injection 90.2 millicurie    Patient Active Problem List   Diagnosis Date Noted  . Cubital tunnel syndrome on left 05/02/2020  . Vaginal discharge 01/20/2020  . Prolapse of anterior vaginal wall 11/10/2019  . Skin ulcer of left thigh, limited to breakdown of skin ([place]) 09/09/2019  . Ulnar nerve compression 08/06/2019  . Diabetic neuropathy associated with type 2 diabetes mellitus ([place]) 08/06/2019  . Body mass index 50.0-59.9, adult ([place]) 06/12/2019  . Impingement syndrome of left shoulder 05/07/2019  . Polyp of colon   . Polyp of ascending colon   . Rectal discomfort 02/12/2019  . Osteoarthritis of right hip 02/12/2019  . Nontraumatic tear of left supraspinatus tendon 12/30/2018  . Gout 12/17/2018  . Nontraumatic complete tear of right rotator cuff 12/08/2018  . Rotator cuff tear, right 12/01/2018  . Tobacco abuse 08/21/2018  . Right groin pain  07/20/2018  . Depressed mood 07/13/2018  . Paroxysmal SVT (supraventricular tachycardia) ([place]) 03/20/2018  . Pure hypercholesterolemia 03/20/2018  . Muscle cramping 02/25/2018  . Nodule of upper lobe of right lung 02/13/2018  . Falls, subsequent encounter 01/30/2018  . Pain in left foot 11/04/2017  . Essential hypertension 10/27/2017  . Solitary pulmonary nodule 10/07/2017  . Restrictive lung disease secondary to obesity 10/01/2017  . Polycythemia, secondary 10/01/2017  . Chronic viral hepatitis B without delta-agent ([place]) 07/08/2017  . COPD (chronic obstructive pulmonary disease) with chronic bronchitis ([place]) 06/27/2017  . Chronic kidney disease (CKD), stage IV (severe) ([place]) 01/14/2017  . Type 2 diabetes mellitus with stage 3 chronic kidney disease, with long-term current use of insulin ([place]) 12/12/2016  . Urge incontinence of urine 12/05/2016  . Trigger finger, left index finger 07/15/2016  . Back pain 04/07/2014  . Morbid obesity ([place]) 10/02/2012  . Chronic diastolic CHF (congestive heart failure) ([place]) 04/07/2012  . GERD 01/26/2010  . Obstructive sleep apnea treated with BiPAP 02/03/2008  . FIBROCYSTIC BREAST DISEASE 10/28/2006  . Hyperlipidemia 03/27/2006  . Obesity hypoventilation syndrome ([place]) 03/27/2006  . Depression with anxiety 03/27/2006    Conditions to be addressed/monitored: Level of care concerns  Care Plan : Clinical Social Work  Updates made by [name], LCSW since 05/23/2020 12:00 AM  Problem: Mobility and Independence   Goal: Mobility and Independence Optimized Completed 05/23/2020  Start Date: 04/06/2020  Expected End Date: 05/27/2020  Recent Progress: On track  Priority: High  Current Barriers:  Goal completed  Patient would like to wait for change in condition form to be completed by PCP   Patient's [name] hours have been reduced from 80 to 46.  States this is because she started over. Will need [name] change of status form to get increase in  hours  Patient unable to consistently perform activities of daily living and needs assistance and support in order to meet this unmet need  Currently unable to independently self manage needs related to chronic health conditions.   Knowledge Deficits related to short term plan for care coordination needs and long term plans for chronic disease management needs Clinical Goals: Over the next 45 to 60 days, patient will have personal care needs met as evident by having [name] Aide in the home  assisting with needs.  Clinical Interventions : . Assessed needs, progress and level of care concerns reference Personal Care Service process,  . Collaborate with primary care provider ref completing [name] referral  . Assessment completed with [facility] and hours have been increases to 80 hours per week . Patient applies for CAPS program but states she was denied . Other interventions provided: Emotional Support and reflective listing  Patient Goals/Self-Care Activities: Over the next 30 days . Call [facility] to scheduled you assessment [phone] or [place], [place] / [place]   [phone] 12:00 PM

## 2020-05-23 NOTE — Chronic Care Management (AMB) (Signed)
    Clinical Social Work  Care Management   Phone Outreach    7/41/6384 Name: [name] MRN: [id number] DOB: [birth year]  [name] is a 55 y.o. year old female who is a primary care patient of [name], MD .   F/U phone call today to assess needs, and progress with care plan goals.   Telephone outreach was unsuccessful A HIPPA compliant phone message was left for the patient providing contact information and requesting a return call.   Plan:CCM LCSW will wait for return call. If no return call is received, Will reach out to patient again in the next 30 days .   Review of patient status, including review of consultants reports, relevant laboratory and other test results, and collaboration with appropriate care team members and the patient's provider was performed as part of comprehensive patient evaluation and provision of care management services.     [name], [place] / [place]   [phone] 10:31 AM

## 2020-05-25 ENCOUNTER — Ambulatory Visit: Payer: 59 | Admitting: Family Medicine

## 2020-05-25 ENCOUNTER — Encounter: Payer: Self-pay | Admitting: Family Medicine

## 2020-05-25 NOTE — Progress Notes (Signed)
2 Week Post-Operative Nutrition Class   Patient was seen on 05/23/2020 for Post-Operative Nutrition education at [facility]    Surgery date: 05/08/2020 Surgery type: sleeve Start weight at [facility]: 405.6 Weight today: pt declined    Body Composition Scale Declined  Current Body Weight   Total Body Fat %   Visceral Fat   Fat-Free Mass %    Total Body Water %   Muscle-Mass lbs   BMI   Body Fat Displacement          Torso  lbs          Left Leg  lbs          Right Leg  lbs          Left Arm  lbs          Right Arm   lbs       The following the learning objectives were met by the patient during this course:  Identifies Phase 3 (Soft, High Proteins) Dietary Goals and will begin from 2 weeks post-operatively to 2 months post-operatively  Identifies appropriate sources of fluids and proteins   Identifies appropriate fat sources and healthy verses unhealthy fat types    States protein recommendations and appropriate sources post-operatively  Identifies the need for appropriate texture modifications, mastication, and bite sizes when consuming solids  Identifies appropriate multivitamin and calcium sources post-operatively  Describes the need for physical activity post-operatively and will follow MD recommendations  States when to call healthcare provider regarding medication questions or post-operative complications   Handouts given during class include:  Phase 3A: Soft, High Protein Diet Handout  Phase 3 High Protein Meals  Healthy Fats   Follow-Up Plan: Patient will follow-up at [facility] in 6 weeks for 2 month post-op nutrition visit for diet advancement per MD.

## 2020-05-29 ENCOUNTER — Telehealth: Payer: Self-pay | Admitting: Skilled Nursing Facility1

## 2020-05-29 ENCOUNTER — Other Ambulatory Visit: Payer: Self-pay | Admitting: Family Medicine

## 2020-05-29 NOTE — Telephone Encounter (Signed)
RD called pt to verify fluid intake once starting soft, solid proteins 2 week post-bariatric surgery.   Daily Fluid intake: 64 oz Daily Protein intake: 60 g Bowel Habits: no issues reported  Concerns/issues:   Returned pts call.

## 2020-05-29 NOTE — Telephone Encounter (Signed)
Noted and agree. 

## 2020-05-29 NOTE — Telephone Encounter (Signed)
RD called pt to verify fluid intake once starting soft, solid proteins 2 week post-bariatric surgery.   Daily Fluid intake:  Daily Protein intake: Bowel Habits:   Concerns/issues:    LVM 

## 2020-05-31 ENCOUNTER — Ambulatory Visit: Payer: 59 | Admitting: Pulmonary Disease

## 2020-06-01 NOTE — Progress Notes (Signed)
Leigh, she was suppose to follow-up with me as we can review this CAT scan this week but she rescheduled.  Please let her know that she still has a persistent 8 mm nodule is has grown a little bit. She needs a repeat noncontrasted CT scan of the chest in 1 year.  Please encourage her to keep her appointment in June so we can discuss next best steps.  Thanks,  BLI  Garner Nash, DO [facility] 06/01/2020 7:19 PM

## 2020-06-06 ENCOUNTER — Telehealth: Payer: Self-pay | Admitting: Pharmacist

## 2020-06-06 ENCOUNTER — Telehealth: Payer: Self-pay | Admitting: Skilled Nursing Facility1

## 2020-06-06 NOTE — Telephone Encounter (Signed)
Returned pts call.  Pt wanted some recommendations for snacks: Dietitian offered pt some different options.

## 2020-06-06 NOTE — Telephone Encounter (Signed)
-----   Message from Leavy Cella, [place] sent at 05/22/2020  4:59 PM EDT ----- Regarding: Quit x 4 weeks - using 1mg  varenicline BID - 6 week follow-up

## 2020-06-06 NOTE — Telephone Encounter (Signed)
Patient reports restarting smoking a few weeks ago.   She started back at 1 PPD - reports now smoking 10 cigs per day.  Plans to taper down and then stop in the near future.   Continues on Chantix at this time - denies any side effects.    Follow-up by phone in 2 weeks.

## 2020-06-07 NOTE — Telephone Encounter (Signed)
Noted and agree. 

## 2020-06-11 ENCOUNTER — Other Ambulatory Visit: Payer: Self-pay | Admitting: Cardiology

## 2020-06-11 ENCOUNTER — Other Ambulatory Visit: Payer: Self-pay | Admitting: Family Medicine

## 2020-06-11 DIAGNOSIS — E1122 Type 2 diabetes mellitus with diabetic chronic kidney disease: Secondary | ICD-10-CM

## 2020-06-11 DIAGNOSIS — Z794 Long term (current) use of insulin: Secondary | ICD-10-CM

## 2020-06-13 ENCOUNTER — Encounter: Payer: Self-pay | Admitting: Family Medicine

## 2020-06-15 MED ORDER — LORATADINE 10 MG PO TABS: 10.0000 mg | ORAL_TABLET | Freq: Every day | ORAL | 11 refills | Status: DC

## 2020-06-20 ENCOUNTER — Ambulatory Visit: Payer: 59 | Admitting: Family Medicine

## 2020-06-20 ENCOUNTER — Encounter: Payer: Self-pay | Admitting: Family Medicine

## 2020-06-20 NOTE — Progress Notes (Deleted)
    SUBJECTIVE:   CHIEF COMPLAINT / HPI:   Diabetes checkup Most recent hemoglobin A1c was 8.8 in February of this year.  PERTINENT  PMH / PSH: ***  OBJECTIVE:   LMP 04/10/2006   ***  ASSESSMENT/PLAN:   No problem-specific Assessment & Plan notes found for this encounter.     [name], MD [place]   {    This will disappear when note is signed, click to select method of visit    :1}

## 2020-06-21 ENCOUNTER — Telehealth: Payer: Self-pay | Admitting: Pharmacist

## 2020-06-21 NOTE — Telephone Encounter (Signed)
Patient contact to assess tobacco intake.    Patient reports smoking 10 or less cigarettes per day.  She continues to take varenicline 1mg  BID which she reports is helping reduce her cravings for smoking.   She complained of significant back pain which started ~ 5 days ago when patient was moving her walker.  She plans to visit her back surgeon to evaluation.   Tobacco Use plan:  Continue Varenicline, reminded to always take with food. Reassess intake in 2 weeks with phone call.

## 2020-06-21 NOTE — Telephone Encounter (Signed)
-----   Message from Leavy Cella, [place] sent at 06/06/2020  3:58 PM EDT ----- Regarding: Tobacco intake reduction

## 2020-06-21 NOTE — Telephone Encounter (Signed)
Noted and agree. 

## 2020-06-28 ENCOUNTER — Ambulatory Visit (INDEPENDENT_AMBULATORY_CARE_PROVIDER_SITE_OTHER): Payer: 59 | Admitting: Family Medicine

## 2020-06-28 ENCOUNTER — Other Ambulatory Visit: Payer: Self-pay

## 2020-06-28 ENCOUNTER — Encounter: Payer: Self-pay | Admitting: Pulmonary Disease

## 2020-06-28 ENCOUNTER — Ambulatory Visit (INDEPENDENT_AMBULATORY_CARE_PROVIDER_SITE_OTHER): Payer: 59

## 2020-06-28 ENCOUNTER — Ambulatory Visit (INDEPENDENT_AMBULATORY_CARE_PROVIDER_SITE_OTHER): Payer: 59 | Admitting: Pulmonary Disease

## 2020-06-28 VITALS — BP 154/80 | HR 97 | Wt 329.0 lb

## 2020-06-28 VITALS — BP 152/88 | HR 104 | Temp 98.0°F | Ht 69.0 in | Wt 329.0 lb

## 2020-06-28 DIAGNOSIS — E669 Obesity, unspecified: Secondary | ICD-10-CM

## 2020-06-28 DIAGNOSIS — Z23 Encounter for immunization: Secondary | ICD-10-CM

## 2020-06-28 DIAGNOSIS — E1122 Type 2 diabetes mellitus with diabetic chronic kidney disease: Secondary | ICD-10-CM

## 2020-06-28 DIAGNOSIS — Z794 Long term (current) use of insulin: Secondary | ICD-10-CM | POA: Diagnosis not present

## 2020-06-28 DIAGNOSIS — R911 Solitary pulmonary nodule: Secondary | ICD-10-CM | POA: Diagnosis not present

## 2020-06-28 DIAGNOSIS — J984 Other disorders of lung: Secondary | ICD-10-CM | POA: Diagnosis not present

## 2020-06-28 DIAGNOSIS — J449 Chronic obstructive pulmonary disease, unspecified: Secondary | ICD-10-CM | POA: Diagnosis not present

## 2020-06-28 DIAGNOSIS — F17210 Nicotine dependence, cigarettes, uncomplicated: Secondary | ICD-10-CM | POA: Diagnosis not present

## 2020-06-28 DIAGNOSIS — N183 Chronic kidney disease, stage 3 unspecified: Secondary | ICD-10-CM

## 2020-06-28 DIAGNOSIS — E7800 Pure hypercholesterolemia, unspecified: Secondary | ICD-10-CM | POA: Diagnosis not present

## 2020-06-28 DIAGNOSIS — G4733 Obstructive sleep apnea (adult) (pediatric): Secondary | ICD-10-CM | POA: Diagnosis not present

## 2020-06-28 NOTE — Progress Notes (Signed)
    SUBJECTIVE:   CHIEF COMPLAINT / HPI:   Diabetes Patient reports that her diabetes is doing wonderfully over the last month and a half.  She had her gastric sleeve procedure in April.  She recently went to a health fair that her insurance had and they completed her hemoglobin A1c.  Hemoglobin A1c on 5/24 was 7.7.  Most recent hemoglobin A1c was 8.8 on 2/18.  Patient brought her blood sugar logs and they all range between 1 and 200.  Most of them are in the low 100s.  Her only insulin that she is using at this time is 3 units of mealtime coverage.  She denies any hypoglycemic episodes.  Health maintenance Patient presents with form, healthcare insurance company that she wants filled out regarding health maintenance.  We are collecting a lipid panel today.  She would also like her Tdap and her COVID booster.  Smoking cessation Patient reports that she is still interested in quitting smoking and is smoking 6 cigarettes a day.  She is currently taking Chantix and feels like she is doing well with that.  OBJECTIVE:   BP (!) 154/80   Pulse 97   Wt (!) 329 lb (149.2 kg)   LMP 04/10/2006   SpO2 92%   BMI 48.58 kg/m   General: Well-appearing, no acute distress Cardiac: Regular rate, regular rhythm Respiratory: Normal for breathing, lungs clear auscultation bilaterally Abdomen: Soft, nontender, positive bowel sounds MSK: Patient is able to walk with walker without difficulty.  ASSESSMENT/PLAN:   Type 2 diabetes mellitus with stage 3 chronic kidney disease, with long-term current use of insulin ([place]) Patient has form from insurance company showing hemoglobin A1c was 7.7 on 5/24.  I will not repeat this today and can repeat it in 3 months.  Currently on 3 units mealtime coverage.  Glucoses approximately 151 blood glucose log. - Continue mealtime coverage - Strict precautions given regarding elevated blood sugars - We will repeat hemoglobin A1c in 3 months - Continue  Ozempic  Hyperlipidemia Patient reports good medication compliance.  Collecting lipid panel today.  We will make changes as needed.     [name], MD [place]

## 2020-06-28 NOTE — Progress Notes (Signed)
Smoking Cessation Counseling:   The patient's current tobacco use: 6 cigarettes/day  Tobacco Use: High Risk  . Smoking Tobacco Use: Current Every Day Smoker  . Smokeless Tobacco Use: Never Used   The patient was advised to quit and impact of smoking on their health.  I assessed the patient's willingness to attempt to quit. I provided methods and skills for cessation. We reviewed medication management of smoking session drugs if appropriate. Discussed restarting Chantix. She has an active prescription.  Resources to help quit smoking were provided. A smoking cessation quit date was set: August 1st Follow-up was arranged in our clinic.  The amount of time spent counseling patient was 4 mins   Garner Nash, DO [facility] 06/28/2020 10:11 AM

## 2020-06-28 NOTE — Progress Notes (Signed)
Synopsis: Referred in November 2019 for COPD by [name], MD  Subjective:   PATIENT ID: [name] GENDER: female DOB: January 23, 1966, MRN: [id number]  Chief Complaint  Patient presents with  . Follow-up    No complaints    PMH of OSA, OHS, on BiPAP, Chronic respiratory failure on 2L day and night. Long time smoker past 40 years 1.5ppd. Recent CT scan with RUL and LUL lung nodule. She has trouble walking and talking at the same time. Currently just managed with albuterol inhaler. Also on trelegy inhaler. Planning to go on the 2nd for evaluation of lap-band surgery. Its a 6 month program that she is enrolling in. She does have allergies to seasons, outdoor and pollen.  Overall the patient has had significant shortness of breath with exertion or minimal activity.  She does understand that her weight plays a large role in this.  She has been trying to watch what she is eating.  She has been successful at quitting smoking within the past year.  She has a sleep study scheduled for evaluation of her OSA and getting a new machine and new mask.  She has daily cough, denies sputum production, denies recent exacerbation.  She has been told that she has COPD but has not had full PFTs before.  She does have chronic diastolic heart failure that is managed with diuretics.  She watches her fluid intake.  She has noticed some edema in her lower extremities.  OV 02/13/2018: She has been doing really well. She started a new fluid pill and feels like it has been better results. She has lost several pounds proximately 20 since she was last seen with [place].  She also obtained a new BiPAP machine for treatment of her obstructive sleep apnea.  She really likes the new machine and has been sleeping so much better.  She has been using a new humidifier which has made it tolerable.  She has been checking her oxygen on a regular basis.  Most of the time with her new finger O2 sat probe she is in the mid 90s.  And has not  needed oxygen at rest and even with exertion in most places.  She does keep it with her just in case.  She is still using oxygen at nighttime with her Pap therapy.  At this point her respiratory complaints are approximately the same.  She does have a follow-up CT scan for March 2020 for bilateral lung nodules and PFTs that have been ordered but not yet completed.  Office spirometry last time revealed reduced spirometry no evidence of obstruction.  OV 08/18/2018: Seen to day in follow up and having PFTS completed today in the offices. She recently describes and episode of getting a steroid injection her had for trigger finger. She was stressed out because having her glucose elevated. She unfortunately started smoking again. She has started her chantix again. She is going to stop again. She stopped for 8 months before restarted.  Pulmonary function test completed today which revealed an FVC of 2.29 L, 65% predicted, FEV1, 1.58 L, 57% predicted, ERV 46%, DLCO 49% predicted.  Patient has a BMI of 55.67.  She states that she cannot go out during the heat of the day.  She tries to avoid leaving her home between noon and 6 PM.  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  Unfortunately she is still smoking. She states to [facility] that she has reduced to 4 cigarettes per day but admits she smokes more bad days. She is BIPAP machine at night and follows with a sleep clinic. She has been seen by ortho and has should pain. And is planning possibly having shoulder sugeries for repair and trigger fingers. Currently holding due to elective surgeries. Also has planned c-scope by GI.  Patient still has dyspnea on exertion throughout the house.  She lives alone.  OV 06/28/2020: Here today for follow-up after recent CT scan imaging.  Patient followed in the office for her moderate COPD, mixed  restrictive obstructive lung disease due to morbid obesity.  Patient recently had CT scan of the chest on 04/25/2020 this was regarding follow-up of a 8 mm right upper lobe pulmonary nodule.  It was previously 8 x 6 mm.  Had slowly been enlarging.  Reviewed today CT imaging with patient in the office. April 11th had gastric sleeve placed, she is down 88lbs. 417>> 329 lbs.  Past Medical History:  Diagnosis Date  . Alcoholism ([place])   . Anxiety   . Arthritis 04-10-11   hips, shoulders, back  . Asthma   . Bipolar disorder ([place])   . Cardiomegaly   . Carpal tunnel syndrome, bilateral   . Cervical cancer ([place]) 1993   cervical, no treatment done, went away per pt  . Cervical dysplasia or atypia 04-10-11   '93- once dx.-got pregnant-no intervention, then postpartum, no dysplasia found  . CHF (congestive heart failure) ([place])    no cardiologist 2014 dx, none now  . Chronic hypoxemic respiratory failure ([place])   . Chronic kidney disease    stage 3 kidney disease  . 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 Pathology showed Condyloma, gluteal CONDYLOMA ACUMINATUM.   [name] Kitchen COPD (chronic obstructive pulmonary disease) ([place])   . Depression   . Diabetes mellitus without complication ([place])   . Dyspnea    with actity, sitting  . Fall 12/16/2016  . Fibrocystic breast disease   . GERD (gastroesophageal reflux disease)   . Grade I diastolic dysfunction 38/75/6433   Noted on ECHO  . Hepatitis    hep B-count is low at present,doesn't register  . History of PSVT (paroxysmal supraventricular tachycardia)   . Hypercalcemia   . Hyperlipidemia   . Hypertension   . Incomplete left bundle branch block (LBBB) 09/24/2018   Noted on EKG  . Lung nodule 11/2017   right and left lung  . Migraine headache    none recent  . Morbid obesity ([place]) 03/27/2006  . Neuropathy   . Pneumonia    walking pneu 15 yrs. ago  . Skin lesion 03/15/2011   In gluteal crease now s/p removal by Dr. [name] of  General Surgery on 3/12. Path shows condyloma.     . Sleep apnea 04-10-11   uses cpap, pt does not know settings  . Trigger finger    left third  . Urge incontinence of urine      Family History  Problem Relation Age of Onset  . Pulmonary embolism Mother   . Stroke Father   . Alcohol abuse Father   . Pneumonia Father   . Hypertension Father   . Breast cancer Maternal Aunt   . Hypertension Other   . Diabetes Other        Aunts and cousins  . Asthma Sister   . Depression Sister   . Arthritis Sister   .  Sickle cell anemia Son      Past Surgical History:  Procedure Laterality Date  . ANAL FISTULECTOMY  04/17/2011   Procedure: FISTULECTOMY ANAL;  Surgeon: Imogene Burn. [name], MD;  Location: [facility];  Service: General;  Laterality: N/A;  Excision of Condyloma Gluteal Cleft   . ANAL RECTAL MANOMETRY N/A 03/26/2019   Procedure: ANO RECTAL MANOMETRY;  Surgeon: Thornton Park, MD;  [facility];  Service: Gastroenterology;  Laterality: N/A;  . BIOPSY  03/09/2019   Procedure: BIOPSY;  Surgeon: Thornton Park, MD;  [facility];  Service: Gastroenterology;;  . Lum [place] INJECTION N/A 12/29/2018   Procedure: BOTOX INJECTION(100 UNITS)  WITH CYSTOSCOPY;  Surgeon: Ceasar Mons, MD;  [facility];  Service: Urology;  Laterality: N/A;  . BOTOX INJECTION N/A 07/27/2019   Procedure: BOTOX INJECTION WITH CYSTOSCOPY;  Surgeon: Ceasar Mons, MD;  [facility];  Service: Urology;  Laterality: N/A;  ONLY NEEDS 30 MIN  . BREAST CYST EXCISION     bilateral breast, 3 cysts removed from each breast  . BREAST EXCISIONAL BIOPSY Right    x 3  . BREAST EXCISIONAL BIOPSY Left    x 3  . CARPAL TUNNEL RELEASE Bilateral   . Cervical biospy    . CERVICAL FUSION  04-10-11   x3- cervical fusion with plating and screws-Dr. [name]  . COLONOSCOPY WITH PROPOFOL N/A 07/06/2015   Procedure: COLONOSCOPY WITH PROPOFOL;  Surgeon: [name], MD;  [facility];   Service: Endoscopy;  Laterality: N/A;  . COLONOSCOPY WITH PROPOFOL N/A 03/09/2019   Procedure: COLONOSCOPY WITH PROPOFOL;  Surgeon: Thornton Park, MD;  [facility];  Service: Gastroenterology;  Laterality: N/A;  . DOPPLER ECHOCARDIOGRAPHY  04/06/2012   AT [place] 55-60%  . FOOT SURGERY Left 2018  . FRACTURE SURGERY     little toe left foot  . I & D EXTREMITY Left 12/18/2016   Procedure: IRRIGATION AND DEBRIDEMENT LEFT FOOT, CLOSURE;  Surgeon: [name], MD;  Location: [place];  Service: Orthopedics;  Laterality: Left;  . LAPAROSCOPIC GASTRIC SLEEVE RESECTION N/A 05/08/2020   Procedure: LAPAROSCOPIC GASTRIC SLEEVE RESECTION;  Surgeon: Kieth Brightly Arta Bruce, MD;  [facility];  Service: General;  Laterality: N/A;  . LUMBAR [place] SURGERY  04-10-11   x5-Lumbar fusion-retained hardware.(Dr. Louanne Skye)  . MULTIPLE TOOTH EXTRACTIONS    . POLYPECTOMY  03/09/2019   Procedure: POLYPECTOMY;  Surgeon: Thornton Park, MD;  [facility];  Service: Gastroenterology;;  . SHOULDER ARTHROSCOPY WITH ROTATOR CUFF REPAIR AND SUBACROMIAL DECOMPRESSION Left 05/07/2019   Procedure: LEFT SHOULDER ARTHROSCOPY WITH DEBRIDEMENT, DISTAL CLAVICLE EXCISION,  SUBACROMIAL DECOMPRESSION AND POSSIBLE ROTATOR CUFF REPAIR;  Surgeon: [name], MD;  Location: [place];  Service: Orthopedics;  Laterality: Left;  . TEE WITHOUT CARDIOVERSION N/A 04/06/2012   Procedure: TRANSESOPHAGEAL ECHOCARDIOGRAM (TEE);  Surgeon: [name], MD;  Location: [facility];  Service: Cardiovascular;  Laterality: N/A;  . TRIGGER FINGER RELEASE Right    middle finger  . TRIGGER FINGER RELEASE Left 06/28/2016   Procedure: RELEASE TRIGGER FINGER LEFT 3RD FINGER;  Surgeon: [name], MD;  Location: [place];  Service: Orthopedics;  Laterality: Left;  . TRIGGER FINGER RELEASE Right 06/12/2016   Procedure: RIGHT INDEX FINGER TRIGGER RELEASE;  Surgeon: [name], MD;  Location: [place];  Service: Orthopedics;   Laterality: Right;  . TRIGGER FINGER RELEASE Right 01/19/2018   Procedure: RIGHT RING FINGER TRIGGER FINGER RELEASE;  Surgeon: [name], MD;  Location: [place];  Service: Orthopedics;  Laterality: Right;  . TRIGGER FINGER RELEASE Left 05/07/2019   Procedure: RELEASE TRIGGER FINGER LEFT INDEX FINGER;  Surgeon: [name], MD;  Location: [place];  Service: Orthopedics;  Laterality: Left;  . UPPER GI ENDOSCOPY N/A 05/08/2020   Procedure: UPPER GI ENDOSCOPY;  Surgeon: Kieth Brightly, Arta Bruce, MD;  [facility];  Service: General;  Laterality: N/A;    Social History   Socioeconomic History  . Marital status: Single    Spouse name: Not on file  . Number of children: 1  . Years of education: 81  . Highest education level: 12th grade  Occupational History  . Occupation: disabled    Comment: Disabled  Tobacco Use  . Smoking status: Current Every Day Smoker    Packs/day: 0.20    Years: 40.00    Pack years: 8.00    Types: Cigarettes    Start date: 01/28/1977  . Smokeless tobacco: Never Used  . Tobacco comment: 6 cigarettes per day  Vaping Use  . Vaping Use: Never used  Substance and Sexual Activity  . Alcohol use: No    Alcohol/week: 0.0 standard drinks  . Drug use: Not Currently    Types: Marijuana, "Crack" cocaine    Comment: hx marijuana usemany years ago, Crack -none since 11/2015  . Sexual activity: Not Currently    Partners: Male    Birth control/protection: Post-menopausal  Other Topics Concern  . Not on file  Social History Narrative   Current Social History       Who lives at home: Patient lives alone in one level home; has 4 steps onto porch with a handrail. Has smoke alarms, no throw rugs. Has elevated toilet. Has shower bench in tub with grab rails.    Transportation: Patient has own vehicle and drives herself    Important Relationships "Family and friends"    Pets: None    Likes to eat varied diet. Seafood, fish, roasted [place] breast, vegetables and salads and  fruits.   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 I'm a lovable person."                                                                                                   Social Determinants of Radio broadcast assistant Strain: Not on file  Food Insecurity: No Food Insecurity  . Worried About Charity fundraiser in the Last Year: Never true  . Ran Out of Food in the Last Year: Never true  Transportation Needs: No Transportation Needs  . Lack of Transportation (Medical): No  . Lack of Transportation (Non-Medical): No  Physical Activity: Not on file  Stress: No Stress Concern Present  . Feeling of Stress : Not  at all  Social Connections: Not on file  Intimate Partner Violence: Not on file     Allergies  Allergen Reactions  . Aspirin Other (See Comments)    Kidney disease  . 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 Corticosteroids Other (See Comments)    Hyperglycemia   . Levofloxacin Itching     Outpatient Medications Prior to Visit  Medication Sig Dispense Refill  . Accu-Chek Softclix Lancets lancets USE TO TEST 4 TIMES A DAY 200 each 6  . acetaminophen (TYLENOL) 500 MG tablet Take 1,000 mg by mouth 2 (two) times daily.    [name] Kitchen albuterol (VENTOLIN HFA) 108 (90 Base) MCG/ACT inhaler Inhale 2 puffs into the lungs every 6 (six) hours as needed for wheezing or shortness of breath. 18 g 3  . allopurinol (ZYLOPRIM) 300 MG tablet Take 300 mg by mouth in the morning.    [name] Kitchen ammonium lactate (LAC-HYDRIN) 12 % lotion APPLY TWICE A DAY AS NEEDED FOR DRY SKIN (Patient taking differently: Apply 1 application topically in the morning and at bedtime.) 400 mL 3  . atorvastatin (LIPITOR) 40 MG tablet TAKE 1 TABLET BY  MOUTH EVERY DAY (Patient taking differently: Take 40 mg by mouth in the morning.) 90 tablet 3  . b complex vitamins capsule Take 1 capsule by mouth daily.    . B-D UF III MINI PEN NEEDLES 31G X 5 MM MISC CHECK SUGARS 4 TIMES A DAY BEFORE MEALS AND AT BEDTIME. 100 each 12  . baclofen (LIORESAL) 10 MG tablet Take 1 tablet (10 mg total) by mouth 3 (three) times daily as needed for muscle spasms. (Patient taking differently: Take 10 mg by mouth 3 (three) times daily.) 90 each 0  . blood glucose meter kit and supplies KIT Dispense based on patient and insurance preference. Use up to four times daily as directed. (FOR ICD-9 250.00, 250.01). 1 each 0  . busPIRone (BUSPAR) 10 MG tablet Take 1 tablet (10 mg total) by mouth 3 (three) times daily. 270 tablet 4  . diclofenac Sodium (VOLTAREN) 1 % GEL APPLY 2 GRAMS TO AFFECTED AREA 4 TIMES A DAY 500 g 1  . diltiazem (CARDIZEM CD) 120 MG 24 hr capsule TAKE 1 CAPSULE (120 MG TOTAL) BY MOUTH 2 (TWO) TIMES DAILY. 180 capsule 2  . docusate sodium (COLACE) 100 MG capsule Take 200 mg by mouth 2 (two) times daily.    . febuxostat (ULORIC) 40 MG tablet Take 40 mg by mouth daily.    . fluticasone (FLONASE) 50 MCG/ACT nasal spray Place 2 sprays into both nostrils 2 (two) times daily. 16 g 6  . Fluticasone-Umeclidin-Vilant (TRELEGY ELLIPTA) 100-62.5-25 MCG/INH AEPB Inhale 1 puff into the lungs daily. 180 each 3  . gabapentin (NEURONTIN) 300 MG capsule Take 2 capsules (600 mg total) by mouth 3 (three) times daily. TAKE 2 CAPSULES (600 MG TOTAL) BY MOUTH in the morning and at lunch and take 3 capsules at night (Patient taking differently: Take 600 mg by mouth 3 (three) times daily.) 540 capsule 2  . glucose blood (ACCU-CHEK AVIVA PLUS) test strip 1 each by Other route 3 (three) times daily. TEST 3 TIMES DAILY 300 each 3  . hydrOXYzine (ATARAX/VISTARIL) 10 MG tablet Take 1 tablet (10 mg total) by mouth daily. 90 tablet 3  . insulin lispro (HUMALOG) 100 UNIT/ML KwikPen Inject  0-20 Units into the skin 3 (three) times daily before meals. CBG < 70: Drink protein shake or  broth for hypoglycemia CBG 70 - 120: 0 units  CBG 121 - 150: 3 units  CBG 151 - 200: 4 units  CBG 201 - 250: 7 units  CBG 251 - 300: 11 units  CBG 301 - 350: 15 units  CBG 351 - 400: 20 units  CBG > 400 call MD 15 mL 3  . ketoconazole (NIZORAL) 2 % cream APPLY 1 FINGERTIP AMOUNT TO EACH FOOT DAILY. (Patient taking differently: Apply 1 application topically in the morning, at noon, and at bedtime.) 30 g 2  . loratadine (CLARITIN) 10 MG tablet Take 1 tablet (10 mg total) by mouth daily. 30 tablet 11  . losartan (COZAAR) 100 MG tablet Take 1 tablet (100 mg total) by mouth daily. 90 tablet 3  . metolazone (ZAROXOLYN) 2.5 MG tablet TAKE 1 TABLET EVERY OTHER DAY. TAKE 1 TABLET TWICE WEEKLY ON MONDAYS AND THURSDAYS ONLY 45 tablet 7  . montelukast (SINGULAIR) 10 MG tablet TAKE 1 TABLET BY MOUTH EVERYDAY AT BEDTIME (Patient taking differently: Take 10 mg by mouth at bedtime.) 90 tablet 3  . Multiple Vitamins-Minerals (MULTIVITAMIN WITH MINERALS) tablet Take 1 tablet by mouth daily.    [name] Kitchen NARCAN 4 MG/0.1ML LIQD nasal spray kit Place 1 spray into the nose as needed (accidental overdose).    . NON FORMULARY Uses a C-PAP at bedtime    . nystatin (MYCOSTATIN) 100000 UNIT/ML suspension TAKE 5 MLS (500,000 UNITS TOTAL) BY MOUTH 4 (FOUR) TIMES DAILY. SWISH AND SWALLOW. (Patient taking differently: Take 5 mLs by mouth 4 (four) times daily as needed (thrush). Swish and swallow.) 60 mL 3  . Omega-3 Fatty Acids (FISH OIL) 1000 MG CAPS Take 1,000 mg by mouth in the morning.    . ondansetron (ZOFRAN-ODT) 4 MG disintegrating tablet Take 1 tablet (4 mg total) by mouth every 6 (six) hours as needed for nausea or vomiting. 20 tablet 0  . Semaglutide,0.25 or 0.5MG/DOS, (OZEMPIC, 0.25 OR 0.5 MG/DOSE,) 2 MG/1.5ML SOPN Inject 0.25 mg into the skin once a week. Minus 2 clicks (Patient taking differently: Inject 0.25 mg into the skin  every Thursday. Minus 2 clicks) 4.5 mL 0  . varenicline (CHANTIX) 1 MG tablet Take 1 tablet (1 mg total) by mouth 2 (two) times daily. 60 tablet 3  . venlafaxine XR (EFFEXOR-XR) 75 MG 24 hr capsule TAKE 1 CAPSULE BY MOUTH DAILY WITH BREAKFAST. (Patient taking differently: Take 75 mg by mouth daily with breakfast.) 90 capsule 2  . enoxaparin (LOVENOX) 40 MG/0.4ML injection Inject 0.4 mLs (40 mg total) into the skin daily. (Patient not taking: Reported on 06/28/2020) 6 mL 0  . omeprazole (PRILOSEC) 20 MG capsule TAKE 1 CAPSULE BY MOUTH EVERY DAY (Patient not taking: Reported on 06/28/2020) 90 capsule 3   Facility-Administered Medications Prior to Visit  Medication Dose Route Frequency Provider Last Rate Last Admin  . technetium tetrofosmin (TC-MYOVIEW) injection 09.3 millicurie  81.8 millicurie Intravenous Once PRN Sueanne Margarita, MD        Review of Systems  Constitutional: Negative for chills, fever, malaise/fatigue and weight loss.  HENT: Negative for hearing loss, sore throat and tinnitus.   Eyes: Negative for blurred vision and double vision.  Respiratory: Positive for shortness of breath. Negative for cough, hemoptysis, sputum production, wheezing and stridor.   Cardiovascular: Negative for chest pain, palpitations, orthopnea, leg swelling and PND.  Gastrointestinal: Negative for abdominal pain, constipation, diarrhea, heartburn, nausea and vomiting.  Genitourinary: Negative for dysuria, hematuria and urgency.  Musculoskeletal: Negative for joint  pain and myalgias.  Skin: Negative for itching and rash.  Neurological: Negative for dizziness, tingling, weakness and headaches.  Endo/Heme/Allergies: Negative for environmental allergies. Does not bruise/bleed easily.  Psychiatric/Behavioral: Positive for depression. The patient is not nervous/anxious and does not have insomnia.   All other systems reviewed and are negative.   Objective:  Physical Exam Vitals reviewed.  Constitutional:       General: She is not in acute distress.    Appearance: She is well-developed. She is obese.  HENT:     Head: Normocephalic and atraumatic.  Eyes:     General: No scleral icterus.    Conjunctiva/sclera: Conjunctivae normal.     Pupils: Pupils are equal, round, and reactive to light.  Neck:     Vascular: No JVD.     Trachea: No tracheal deviation.  Cardiovascular:     Rate and Rhythm: Normal rate and regular rhythm.     Heart sounds: Normal heart sounds. No murmur heard.   Pulmonary:     Effort: Pulmonary effort is normal. No tachypnea, accessory muscle usage or respiratory distress.     Breath sounds: Normal breath sounds. No stridor. No wheezing, rhonchi or rales.  Abdominal:     General: Bowel sounds are normal. There is no distension.     Palpations: Abdomen is soft.     Tenderness: There is no abdominal tenderness.  Musculoskeletal:        General: No tenderness.     Cervical back: Neck supple.  Lymphadenopathy:     Cervical: No cervical adenopathy.  Skin:    General: Skin is warm and dry.     Capillary Refill: Capillary refill takes less than 2 seconds.     Findings: No rash.  Neurological:     Mental Status: She is alert and oriented to person, place, and time.  Psychiatric:        Behavior: Behavior normal.      Vitals:   06/28/20 0954  BP: (!) 152/88  Pulse: (!) 104  Temp: 98 F (36.7 C)  SpO2: 95%  Weight: (!) 329 lb (149.2 kg)  Height: '5\' 9"'  (1.753 m)   95% on RA BMI Readings from Last 3 Encounters:  06/28/20 48.58 kg/m  06/28/20 48.58 kg/m  05/19/20 51.39 kg/m   Wt Readings from Last 3 Encounters:  06/28/20 (!) 329 lb (149.2 kg)  06/28/20 (!) 329 lb (149.2 kg)  05/19/20 (!) 348 lb (157.9 kg)    Chest Imaging: September 2019 CT chest: Right upper lobe 6 mm lung nodule, irregular, 4 mm irregular dense nodule left upper lobe The patient's images have been independently reviewed by me.    CT chest 08/07/2018: Reviewed images today in the  office again with the patient regarding her CT chest in July 2020.  Patient has a 12 mm subsolid nodule within the right upper lobe with slight increase from previous imaging.  Recommending follow-up to document stability. The patient's images have been independently reviewed by me.    04/25/2020: CT chest Follow-up right upper lobe lung nodule.  No 8 mm x 8 mm in size.  Slowly enlarging.  Was 8 mm x 6 mm previously.  Underlying indolent neoplasm not excluded. The patient's images have been independently reviewed by me.     Pulmonary Functions Testing Results: PFT Results Latest Ref Rng & Units 08/18/2018  FVC-Pre L 2.32  FVC-Predicted Pre % 66  FVC-Post L 2.29  FVC-Predicted Post % 65  Pre FEV1/FVC % % 68  Post FEV1/FCV % % 69  FEV1-Pre L 1.57  FEV1-Predicted Pre % 56  FEV1-Post L 1.58  DLCO uncorrected ml/min/mmHg 12.30  DLCO UNC% % 49  DLVA Predicted % 88  TLC L 3.41  TLC % Predicted % 57  RV % Predicted % 51    FeNO: None   Pathology: None  Echocardiogram:  Study Conclusions - Left ventricle: The cavity size was normal. Wall thickness was   normal. Systolic function was normal. The estimated ejection   fraction was in the range of 55% to 60%. Wall motion was normal;   there were no regional wall motion abnormalities. Doppler   parameters are consistent with abnormal left ventricular   relaxation (grade 1 diastolic dysfunction).  Heart Catheterization: None      Assessment & Plan:      ICD-10-CM   1. Nodule of upper lobe of right lung  R91.1   2. COPD (chronic obstructive pulmonary disease) with chronic bronchitis ([place])  J44.9   3. Restrictive lung disease secondary to obesity  J98.4    E66.9   4. Solitary pulmonary nodule  R91.1   5. Obstructive sleep apnea treated with BiPAP  G47.33     Discussion:  55 year old morbidly obese female, OSA, OHS on BiPAP therapy.  Has moderate COPD with mixed restrictive and obstructive defect due to weight.  Unfortunately  she has continued to smoke approximately 6 cigarettes a day.  We have followed her here regarding smoking cessation and management of her COPD and a persistent right upper lobe pulmonary nodule.  Plans: Continue weight loss program after gastric sleeve.  She is doing great with this. Continue current inhaler regimen. Encouraged her to restart her Chantix and consider smoking cessation. Patient counseled today on smoking cessation. Please see separate documentation regarding this.  As for her lung nodule this is slowly enlarged was 6 mm x 7 mm last year.  Now 8 mm x 8 mm. She would like to continue to follow this for at least 3 to 4 months before making any decisions.  She would also like to consider losing more weight before having procedure. I agree that it makes sense to conservatively follow. We have a repeat noncontrasted CT chest and August 2022. After this we will make decisions regarding biopsy or neck steps for nodule. She is agreeable to this plan.  Super D CT has been ordered for August 2022 Patient to follow-up with me after CT images in August/September.   Garner Nash, DO [facility] 06/28/2020 9:59 AM

## 2020-06-28 NOTE — Patient Instructions (Signed)
It was wonderful seeing you today!  I am glad you are doing so well and keep up the good work with the weight loss and smoking cessation.  We are giving you your COVID booster today as well as the Tdap vaccine.  I am going to collect a cholesterol panel to check your cholesterol.  Congratulations on the great work with your hemoglobin A1c as well.  If anything please let me know.  I hope you have a wonderful afternoon!  Td (Tetanus, Diphtheria) Vaccine: What You Need to Know 1. Why get vaccinated? Td vaccine can prevent tetanus and diphtheria. Tetanus enters the body through cuts or wounds. Diphtheria spreads from person to person.  TETANUS (T) causes painful stiffening of the muscles. Tetanus can lead to serious health problems, including being unable to open the mouth, having trouble swallowing and breathing, or death.  DIPHTHERIA (D) can lead to difficulty breathing, heart failure, paralysis, or death. 2. Td vaccine Td is only for children 7 years and older, adolescents, and adults.  Td is usually given as a booster dose every 10 years, or after 5 years in the case of a severe or dirty wound or burn. Another vaccine, called "Tdap," may be used instead of Td. Tdap protects against pertussis, also known as "whooping cough," in addition to tetanus and diphtheria. Td may be given at the same time as other vaccines. 3. Talk with your health care provider Tell your vaccination provider if the person getting the vaccine:  Has had an allergic reaction after a previous dose of any vaccine that protects against tetanus or diphtheria, or has any severe, life-threatening allergies  Has ever had Guillain-Barr Syndrome (also called "GBS")  Has had severe pain or swelling after a previous dose of any vaccine that protects against tetanus or diphtheria In some cases, your health care provider may decide to postpone Td vaccination until a future visit. People with minor illnesses, such as a cold, may be  vaccinated. People who are moderately or severely ill should usually wait until they recover before getting Td vaccine.  Your health care provider can give you more information. 4. Risks of a vaccine reaction  Pain, redness, or swelling where the shot was given, mild fever, headache, feeling tired, and nausea, vomiting, diarrhea, or stomachache sometimes happen after Td vaccination. People sometimes faint after medical procedures, including vaccination. Tell your provider if you feel dizzy or have vision changes or ringing in the ears.  As with any medicine, there is a very remote chance of a vaccine causing a severe allergic reaction, other serious injury, or death. 5. What if there is a serious problem? An allergic reaction could occur after the vaccinated person leaves the clinic. If you see signs of a severe allergic reaction (hives, swelling of the face and throat, difficulty breathing, a fast heartbeat, dizziness, or weakness), call 9-1-1 and get the person to the nearest hospital.  For other signs that concern you, call your health care provider.  Adverse reactions should be reported to the Vaccine Adverse Event Reporting System (VAERS). Your health care provider will usually file this report, or you can do it yourself. Visit the VAERS website at www.vaers.SamedayNews.es or call 978 696 2096. VAERS is only for reporting reactions, and VAERS staff members do not give medical advice. 6. The National Vaccine Injury Compensation Program The [employer] Vaccine Injury Compensation Program (VICP) is a federal program that was created to compensate people who may have been injured by certain vaccines. Claims regarding alleged  injury or death due to vaccination have a time limit for filing, which may be as short as two years. Visit the VICP website at GoldCloset.com.ee or call 450 306 5847 to learn about the program and about filing a claim. 7. How can I learn more?  Ask your health care  provider.  Call your local or state health department.  Visit the website of the Food and Drug Administration (FDA) for vaccine package inserts and additional information at TraderRating.uy.  Contact the Centers for Disease Control and Prevention (CDC): ? Call 605-804-6707 (1-800-CDC-INFO) or ? Visit CDC's website at http://hunter.com/. Vaccine Information Statement Td (Tetanus, Diphtheria) Vaccine (09/03/2019) This information is not intended to replace advice given to you by your health care provider. Make sure you discuss any questions you have with your health care provider. Document Revised: 10/21/2019 Document Reviewed: 10/21/2019 Elsevier Patient Education  2021 [employer].

## 2020-06-28 NOTE — Assessment & Plan Note (Signed)
Patient has form from insurance company showing hemoglobin A1c was 7.7 on 5/24.  I will not repeat this today and can repeat it in 3 months.  Currently on 3 units mealtime coverage.  Glucoses approximately 151 blood glucose log. - Continue mealtime coverage - Strict precautions given regarding elevated blood sugars - We will repeat hemoglobin A1c in 3 months - Continue Ozempic

## 2020-06-28 NOTE — Assessment & Plan Note (Signed)
Patient reports good medication compliance.  Collecting lipid panel today.  We will make changes as needed.

## 2020-06-28 NOTE — Patient Instructions (Signed)
Thank you for visiting Dr. Valeta Harms at [facility]. Today we recommend the following:  Orders Placed This Encounter  Procedures  . CT Super D Chest Wo Contrast   CT chest in August  See me in  August or September to discuss results and consider biopsy plans if nodule is persistent   Return in about 3 months (around 09/28/2020) for w/ Dr. Valeta Harms .    Please do your part to reduce the spread of COVID-19.

## 2020-06-29 LAB — LIPID PANEL
Chol/HDL Ratio: 3.1 ratio (ref 0.0–4.4)
Cholesterol, Total: 142 mg/dL (ref 100–199)
HDL: 46 mg/dL (ref >39)
LDL Chol Calc (NIH): 73 mg/dL (ref 0–99)
Triglycerides: 130 mg/dL (ref 0–149)
VLDL Cholesterol Cal: 23 mg/dL (ref 5–40)

## 2020-06-30 ENCOUNTER — Ambulatory Visit: Payer: Self-pay

## 2020-06-30 ENCOUNTER — Ambulatory Visit (INDEPENDENT_AMBULATORY_CARE_PROVIDER_SITE_OTHER): Payer: 59 | Admitting: Specialist

## 2020-06-30 ENCOUNTER — Encounter: Payer: Self-pay | Admitting: Specialist

## 2020-06-30 ENCOUNTER — Other Ambulatory Visit: Payer: Self-pay

## 2020-06-30 VITALS — BP 148/90 | HR 106 | Ht 69.0 in | Wt 329.4 lb

## 2020-06-30 DIAGNOSIS — M4815 Ankylosing hyperostosis [Forestier], thoracolumbar region: Secondary | ICD-10-CM

## 2020-06-30 DIAGNOSIS — M48062 Spinal stenosis, lumbar region with neurogenic claudication: Secondary | ICD-10-CM | POA: Diagnosis not present

## 2020-06-30 DIAGNOSIS — Z981 Arthrodesis status: Secondary | ICD-10-CM

## 2020-06-30 DIAGNOSIS — Z4889 Encounter for other specified surgical aftercare: Secondary | ICD-10-CM | POA: Diagnosis not present

## 2020-06-30 DIAGNOSIS — Z6841 Body Mass Index (BMI) 40.0 and over, adult: Secondary | ICD-10-CM

## 2020-06-30 DIAGNOSIS — M4807 Spinal stenosis, lumbosacral region: Secondary | ICD-10-CM | POA: Diagnosis not present

## 2020-06-30 NOTE — Patient Instructions (Signed)
Avoid bending, stooping and avoid lifting weights greater than 10 lbs. Avoid prolong standing and walking. Avoid frequent bending and stooping  No lifting greater than 10 lbs. May use ice or moist heat for pain. Weight loss is of benefit. Handicap license is approved.  Well padded shoes help. Ice the knee the is suffering from osteoarthritis, only real proven treatments are Weight loss, do not take NSIADs due to kidney injury when taking these and exercise. Well padded shoes help. Ice the knee 2-3 times a day 15-20 mins at a time.-3 times a day 15-20 mins at a time. Hot showers in the AM.  Injection with steroid may be of benefit. Hemp CBD capsules, amazon.com 5,000-7,000 mg per bottle, 60 capsules per bottle, take one capsule twice a day. Cane in the left hand to use with right leg weight bearing. Follow-Up Instructions: No follow-ups on file.

## 2020-06-30 NOTE — Progress Notes (Signed)
Office Visit Note   Patient: [name]           Date of Birth: Oct 04, 1965           MRN: [id number] Visit Date: 06/30/2020              Requested by: [name], MD [address],  [place] [address] PCP: [name], MD   Assessment & Plan: Visit Diagnoses:  1. S/P lumbar fusion   2. Encounter for other specified surgical aftercare   3. Spinal stenosis of lumbosacral region   4. Spinal stenosis of lumbar region with neurogenic claudication   5. Body mass index 50.0-59.9, adult ([place])   6. 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 Avoid prolong standing and walking. Avoid frequent bending and stooping  No lifting greater than 10 lbs. May use ice or moist heat for pain. Weight loss is of benefit. Handicap license is approved.  Well padded shoes help. Ice the knee the is suffering from osteoarthritis, only real proven treatments are Weight loss, do not take NSIADs due to kidney injury when taking these and exercise. Well padded shoes help. Ice the knee 2-3 times a day 15-20 mins at a time.-3 times a day 15-20 mins at a time. Hot showers in the AM.  Injection with steroid may be of benefit. Hemp CBD capsules, amazon.com 5,000-7,000 mg per bottle, 60 capsules per bottle, take one capsule twice a day. Cane in the left hand to use with right leg weight bearing. Follow-Up Instructions: No follow-ups on file  Follow-Up Instructions: Return in about 3 weeks (around 07/21/2020).   Orders:  Orders Placed This Encounter  Procedures  . MR Lumbar Spine W Wo Contrast  . XR Lumbar Spine 2-3 Views   No orders of the defined types were placed in this encounter.     Procedures: No procedures performed   Clinical Data: No additional findings.   Subjective: Chief Complaint  Patient presents with  . Lower Back - Follow-up, Pain    55 year old female with  history of lumbar spinal stenosis. She has been losing weight, is morbidly obese and has lost on her own nearly 40-50 lbs and now has decreased her weight to 329. She is targeting 200 for her goal and underwent a gastric sleeve surgery in May 08, 2020 and has lost nearly 30 lbs since having the bypass. She has previous lumbar fusion and is having symptoms of neurogenic claudication. She has pain mainly in the back and right LE with right hip and right knee pain. She has findings of lumbar spinal stenosis and is now to the point where she indicates that she is not able to go to work, not able to exercise, she prefers a pool walking program due to pain. She is not having bladder difficulty, had botox for bladder and that is doing well.No bowel difficulty. She can stand with pain and can walk a few steps. Not able to grocery shop.    Review of Systems  Constitutional: Negative.   HENT: Negative.   Eyes: Negative.   Respiratory: Negative.   Cardiovascular: Negative.   Gastrointestinal: Negative.   Endocrine: Negative.   Genitourinary: Negative.   Musculoskeletal: Negative.   Skin: Negative.   Allergic/Immunologic: Negative.   Neurological: Negative.   Hematological: Negative.   Psychiatric/Behavioral: Negative.      Objective: Vital Signs: BP (!) 148/90 (BP Location:  Left Arm, Patient Position: Sitting)   Pulse (!) 106   Ht 5\' 9"  (1.753 m)   Wt (!) 329 lb 6.4 oz (149.4 kg)   LMP 04/10/2006   BMI 48.64 kg/m   Physical Exam Constitutional:      Appearance: She is well-developed.  HENT:     Head: Normocephalic and atraumatic.  Eyes:     Pupils: Pupils are equal, round, and reactive to light.  Pulmonary:     Effort: Pulmonary effort is normal.     Breath sounds: Normal breath sounds.  Abdominal:     General: Bowel sounds are normal.     Palpations: Abdomen is soft.  Musculoskeletal:     Cervical back: Normal range of motion and neck supple.     Lumbar back: Negative right  straight leg raise test and negative left straight leg raise test.  Skin:    General: Skin is warm and dry.  Neurological:     Mental Status: She is alert and oriented to person, place, and time.  Psychiatric:        Behavior: Behavior normal.        Thought Content: Thought content normal.        Judgment: Judgment normal.     Back Exam   Tenderness  The patient is experiencing tenderness in the lumbar.  Range of Motion  Extension: abnormal  Flexion: abnormal  Lateral bend right: abnormal  Lateral bend left: abnormal  Rotation right: abnormal  Rotation left: abnormal   Muscle Strength  Right Quadriceps:  5/5  Left Quadriceps:  5/5  Right Hamstrings:  5/5  Left Hamstrings:  5/5   Tests  Straight leg raise right: negative Straight leg raise left: negative  Other  Toe walk: normal Heel walk: normal Sensation: normal Gait: normal  Erythema: no back redness Scars: absent      Specialty Comments:  No specialty comments available.  Imaging: XR Lumbar Spine 2-3 Views  Result Date: 06/30/2020 Ap and lateral flexion and extension radiographs of the lumbar spine demonstrate large anterior syndesmophytes bridging the anterior lower thoracic spine levels T9-L1 and there is disc space narrowing at the L1-2 and L2-3 disc levels, minor spondylolisthesis L2-3. No acute changes. Right hip with severe narrowing and collapse of the lateral superior joint line consistent with severe OA of the right hip. Previous L3 to S1 fusion with ray cages L4-5 and L5-S1 with subsidence at both levels.     PMFS History: Patient Active Problem List   Diagnosis Date Noted  . Cubital tunnel syndrome on left 05/02/2020  . Vaginal discharge 01/20/2020  . Prolapse of anterior vaginal wall 11/10/2019  . Skin ulcer of left thigh, limited to breakdown of skin ([place]) 09/09/2019  . Ulnar nerve compression 08/06/2019  . Diabetic neuropathy associated with type 2 diabetes mellitus ([place]) 08/06/2019   . Body mass index 50.0-59.9, adult ([place]) 06/12/2019  . Impingement syndrome of left shoulder 05/07/2019  . Polyp of colon   . Polyp of ascending colon   . Rectal discomfort 02/12/2019  . Osteoarthritis of right hip 02/12/2019  . Nontraumatic tear of left supraspinatus tendon 12/30/2018  . Gout 12/17/2018  . Nontraumatic complete tear of right rotator cuff 12/08/2018  . Rotator cuff tear, right 12/01/2018  . Tobacco abuse 08/21/2018  . Right groin pain 07/20/2018  . Depressed mood 07/13/2018  . Paroxysmal SVT (supraventricular tachycardia) ([place]) 03/20/2018  . Pure hypercholesterolemia 03/20/2018  . Muscle cramping 02/25/2018  . Nodule of upper lobe of  right lung 02/13/2018  . Falls, subsequent encounter 01/30/2018  . Pain in left foot 11/04/2017  . Essential hypertension 10/27/2017  . Solitary pulmonary nodule 10/07/2017  . Restrictive lung disease secondary to obesity 10/01/2017  . Polycythemia, secondary 10/01/2017  . Chronic viral hepatitis B without delta-agent ([place]) 07/08/2017  . COPD (chronic obstructive pulmonary disease) with chronic bronchitis ([place]) 06/27/2017  . Chronic kidney disease (CKD), stage IV (severe) ([place]) 01/14/2017  . Type 2 diabetes mellitus with stage 3 chronic kidney disease, with long-term current use of insulin ([place]) 12/12/2016  . Urge incontinence of urine 12/05/2016  . Trigger finger, left index finger 07/15/2016  . Back pain 04/07/2014  . Morbid obesity ([place]) 10/02/2012  . Chronic diastolic CHF (congestive heart failure) ([place]) 04/07/2012  . GERD 01/26/2010  . Obstructive sleep apnea treated with BiPAP 02/03/2008  . FIBROCYSTIC BREAST DISEASE 10/28/2006  . Hyperlipidemia 03/27/2006  . Obesity hypoventilation syndrome ([place]) 03/27/2006  . Depression with anxiety 03/27/2006   Past Medical History:  Diagnosis Date  . Alcoholism ([place])   . Anxiety   . Arthritis 04-10-11   hips, shoulders, back  . Asthma   . Bipolar disorder ([place])   . Cardiomegaly    . Carpal tunnel syndrome, bilateral   . Cervical cancer ([place]) 1993   cervical, no treatment done, went away per pt  . Cervical dysplasia or atypia 04-10-11   '93- once dx.-got pregnant-no intervention, then postpartum, no dysplasia found  . CHF (congestive heart failure) ([place])    no cardiologist 2014 dx, none now  . Chronic hypoxemic respiratory failure ([place])   . Chronic kidney disease    stage 3 kidney disease  . 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 Pathology showed Condyloma, gluteal CONDYLOMA ACUMINATUM.   [name] Kitchen COPD (chronic obstructive pulmonary disease) ([place])   . Depression   . Diabetes mellitus without complication ([place])   . Dyspnea    with actity, sitting  . Fall 12/16/2016  . Fibrocystic breast disease   . GERD (gastroesophageal reflux disease)   . Grade I diastolic dysfunction 59/16/3846   Noted on ECHO  . Hepatitis    hep B-count is low at present,doesn't register  . History of PSVT (paroxysmal supraventricular tachycardia)   . Hypercalcemia   . Hyperlipidemia   . Hypertension   . Incomplete left bundle branch block (LBBB) 09/24/2018   Noted on EKG  . Lung nodule 11/2017   right and left lung  . Migraine headache    none recent  . Morbid obesity ([place]) 03/27/2006  . Neuropathy   . Pneumonia    walking pneu 15 yrs. ago  . Skin lesion 03/15/2011   In gluteal crease now s/p removal by Dr. [name] of General Surgery on 3/12. Path shows condyloma.     . Sleep apnea 04-10-11   uses cpap, pt does not know settings  . Trigger finger    left third  . Urge incontinence of urine     Family History  Problem Relation Age of Onset  . Pulmonary embolism Mother   . Stroke Father   . Alcohol abuse Father   . Pneumonia Father   . Hypertension Father   . Breast cancer Maternal Aunt   . Hypertension Other   . Diabetes Other        Aunts and cousins  . Asthma Sister   . Depression Sister   . Arthritis Sister   . Sickle cell anemia Son     Past  Surgical History:  Procedure Laterality Date  . ANAL FISTULECTOMY  04/17/2011   Procedure: FISTULECTOMY ANAL;  Surgeon: Imogene Burn. [name], MD;  Location: [facility];  Service: General;  Laterality: N/A;  Excision of Condyloma Gluteal Cleft   . ANAL RECTAL MANOMETRY N/A 03/26/2019   Procedure: ANO RECTAL MANOMETRY;  Surgeon: Thornton Park, MD;  Location: [facility];  Service: Gastroenterology;  Laterality: N/A;  . BIOPSY  03/09/2019   Procedure: BIOPSY;  Surgeon: Thornton Park, MD;  [facility];  Service: Gastroenterology;;  . Lum [place] INJECTION N/A 12/29/2018   Procedure: BOTOX INJECTION(100 UNITS)  WITH CYSTOSCOPY;  Surgeon: Ceasar Mons, MD;  Location: [facility];  Service: Urology;  Laterality: N/A;  . BOTOX INJECTION N/A 07/27/2019   Procedure: BOTOX INJECTION WITH CYSTOSCOPY;  Surgeon: Ceasar Mons, MD;  Location: [facility];  Service: Urology;  Laterality: N/A;  ONLY NEEDS 30 MIN  . BREAST CYST EXCISION     bilateral breast, 3 cysts removed from each breast  . BREAST EXCISIONAL BIOPSY Right    x 3  . BREAST EXCISIONAL BIOPSY Left    x 3  . CARPAL TUNNEL RELEASE Bilateral   . Cervical biospy    . CERVICAL FUSION  04-10-11   x3- cervical fusion with plating and screws-Dr. [name]  . COLONOSCOPY WITH PROPOFOL N/A 07/06/2015   Procedure: COLONOSCOPY WITH PROPOFOL;  Surgeon: [name], MD;  [facility];  Service: Endoscopy;  Laterality: N/A;  . COLONOSCOPY WITH PROPOFOL N/A 03/09/2019   Procedure: COLONOSCOPY WITH PROPOFOL;  Surgeon: Thornton Park, MD;  [facility];  Service: Gastroenterology;  Laterality: N/A;  . DOPPLER ECHOCARDIOGRAPHY  04/06/2012   AT [place] 55-60%  . FOOT SURGERY Left 2018  . FRACTURE SURGERY     little toe left foot  . I & D EXTREMITY Left 12/18/2016   Procedure: IRRIGATION AND DEBRIDEMENT LEFT FOOT, CLOSURE;  Surgeon: [name], MD;  Location: [facility];  Service: Orthopedics;  Laterality: Left;  .  LAPAROSCOPIC GASTRIC SLEEVE RESECTION N/A 05/08/2020   Procedure: LAPAROSCOPIC GASTRIC SLEEVE RESECTION;  Surgeon: Kieth Brightly Arta Bruce, MD;  [facility];  Service: General;  Laterality: N/A;  . LUMBAR [name] SURGERY  04-10-11   x5-Lumbar fusion-retained hardware.(Dr. Louanne Skye)  . MULTIPLE TOOTH EXTRACTIONS    . POLYPECTOMY  03/09/2019   Procedure: POLYPECTOMY;  Surgeon: Thornton Park, MD;  [facility];  Service: Gastroenterology;;  . SHOULDER ARTHROSCOPY WITH ROTATOR CUFF REPAIR AND SUBACROMIAL DECOMPRESSION Left 05/07/2019   Procedure: LEFT SHOULDER ARTHROSCOPY WITH DEBRIDEMENT, DISTAL CLAVICLE EXCISION,  SUBACROMIAL DECOMPRESSION AND POSSIBLE ROTATOR CUFF REPAIR;  Surgeon: [name], MD;  Location: [place] [facility];  Service: Orthopedics;  Laterality: Left;  . TEE WITHOUT CARDIOVERSION N/A 04/06/2012   Procedure: TRANSESOPHAGEAL ECHOCARDIOGRAM (TEE);  Surgeon: [name], MD;  Location: [facility];  Service: Cardiovascular;  Laterality: N/A;  . TRIGGER FINGER RELEASE Right    middle finger  . TRIGGER FINGER RELEASE Left 06/28/2016   Procedure: RELEASE TRIGGER FINGER LEFT 3RD FINGER;  Surgeon: [name], MD;  Location: [place];  Service: Orthopedics;  Laterality: Left;  . TRIGGER FINGER RELEASE Right 06/12/2016   Procedure: RIGHT INDEX FINGER TRIGGER RELEASE;  Surgeon: [name], MD;  Location: [place];  Service: Orthopedics;  Laterality: Right;  . TRIGGER FINGER RELEASE Right 01/19/2018   Procedure: RIGHT RING FINGER TRIGGER FINGER RELEASE;  Surgeon: [name], MD;  Location: [place];  Service: Orthopedics;  Laterality: Right;  .  TRIGGER FINGER RELEASE Left 05/07/2019   Procedure: RELEASE TRIGGER FINGER LEFT INDEX FINGER;  Surgeon: [name], MD;  Location: [place];  Service: Orthopedics;  Laterality: Left;  . UPPER GI ENDOSCOPY N/A 05/08/2020   Procedure: UPPER GI ENDOSCOPY;  Surgeon: Kieth Brightly, Arta Bruce, MD;  [facility];  Service: General;  Laterality: N/A;   Social  History   Occupational History  . Occupation: disabled    Comment: Disabled  Tobacco Use  . Smoking status: Current Every Day Smoker    Packs/day: 0.20    Years: 40.00    Pack years: 8.00    Types: Cigarettes    Start date: 01/28/1977  . Smokeless tobacco: Never Used  . Tobacco comment: 6 cigarettes per day  Vaping Use  . Vaping Use: Never used  Substance and Sexual Activity  . Alcohol use: No    Alcohol/week: 0.0 standard drinks  . Drug use: Not Currently    Types: Marijuana, "Crack" cocaine    Comment: hx marijuana usemany years ago, Crack -none since 11/2015  . Sexual activity: Not Currently    Partners: Male    Birth control/protection: Post-menopausal

## 2020-07-03 ENCOUNTER — Other Ambulatory Visit: Payer: Self-pay

## 2020-07-03 ENCOUNTER — Encounter: Payer: 59 | Attending: General Surgery | Admitting: Skilled Nursing Facility1

## 2020-07-03 DIAGNOSIS — Z794 Long term (current) use of insulin: Secondary | ICD-10-CM | POA: Diagnosis present

## 2020-07-03 DIAGNOSIS — E1122 Type 2 diabetes mellitus with diabetic chronic kidney disease: Secondary | ICD-10-CM | POA: Insufficient documentation

## 2020-07-03 DIAGNOSIS — N183 Chronic kidney disease, stage 3 unspecified: Secondary | ICD-10-CM | POA: Diagnosis present

## 2020-07-03 DIAGNOSIS — N184 Chronic kidney disease, stage 4 (severe): Secondary | ICD-10-CM | POA: Insufficient documentation

## 2020-07-03 NOTE — Progress Notes (Signed)
Bariatric Nutrition Follow-Up Visit Medical Nutrition Therapy  Appt Start Time: 9:23 End Time: 9:55  2 Months Post-Operative sleeve Surgery Surgery Date: 05/08/2020   NUTRITION ASSESSMENT    Anthropometrics  Start weight at [facility]: 405.6 lbs (date: 07/08/2018) Today's weight: 329.8 lbs  Body Composition Scale 07/03/2020  Weight  lbs 329.8  Total Body Fat  % 49.6     Visceral Fat 22  Fat-Free Mass  % 50.3     Total Body Water  % 39.6     Muscle-Mass  lbs 35  BMI 48.5  Body Fat Displacement ---        Torso  lbs 101.6        Left Leg  lbs 20.3        Right Leg  lbs 20.3        Left Arm  lbs 10.1        Right Arm  lbs 10.1   Clinical  Medical hx: HTN, Sleep apnea, GERD, DM Medications: Humalog sometimes not needing it Labs: Lipid panel WNL   Lifestyle & Dietary Hx  Pt arrives with the use of a walker stating her back and hips are giving out on her stating she will be getting back surgery. Pt states pinto beans cause gas and bloat. Pt states mirilax causes really bad cramps for her. Pt state she is really excited to add in non starchy vegetables because she knows she will have more normal bowel movements.  Pt states she checks her blood sugars 3 times day: 76- with the highest being 157 taking insulin at that time only needing more insulin 4 times since surgery.  Pt states with her back hurting so bad she has not even been able to go to the pool.   Estimated daily fluid intake: 64 oz Estimated daily protein intake: 60 g Supplements: 1000 mg at once calcium and multi Current average weekly physical activity: ADLs due to back pain plans to have a surgery and then will have a hip replacement and then the knee replacement   24-Hr Dietary Recall First Meal: greek yogurt Snack: protein water Second Meal: shrimp or fish sometimes both Snack:  Third Meal: chicken wings Snack: protein shake to take medicine  Beverages: water, protein water  Post-Op Goals/ Signs/  Symptoms Using straws: no Drinking while eating: no Chewing/swallowing difficulties: no Changes in vision: no Changes to mood/headaches: no Hair loss/changes to skin/nails: no Difficulty focusing/concentrating: no Sweating: no Dizziness/lightheadedness: no Palpitations: no  Carbonated/caffeinated beverages: no N/V/D/C/Gas: yes 2 bowel movements a week; sundays milk of magnesia  Abdominal pain: no Dumping syndrome: no    NUTRITION DIAGNOSIS  Overweight/obesity ([place]-3.3) related to past poor dietary habits and physical inactivity as evidenced by completed bariatric surgery and following dietary guidelines for continued weight loss and healthy nutrition status.     NUTRITION INTERVENTION Nutrition counseling (C-1) and education (E-2) to facilitate bariatric surgery goals, including: . Diet advancement to the next phase (phase 4) now including non starchy vegetables  . The importance of consuming adequate calories as well as certain nutrients daily due to the body's need for essential vitamins, minerals, and fats . The importance of daily physical activity and to reach a goal of at least 150 minutes of moderate to vigorous physical activity weekly (or as directed by their physician) due to benefits such as increased musculature and improved lab values . The importance of intuitive eating specifically learning hunger-satiety cues and understanding the importance of learning a new body: The  importance of mindful eating to avoid grazing behaviors   Goals: -cut your 1000 mg of calcium in half -Continue to aim for a minimum of 64 fluid ounces 7 days a week with at least 30 ounces being plain water -Eat non-starchy vegetables 2 times a day 7 days a week -Start out with soft cooked vegetables today and tomorrow; if tolerated begin to eat raw vegetables or cooked including salads -Eat your 3 ounces of protein first then start in on your non-starchy vegetables; once you understand how much of your  meal leads to satisfaction and not full while still eating 3 ounces of protein and non-starchy vegetables you can eat them in any order  -Continue to aim for 30 minutes of activity at least 5 times a week -Do NOT cook with/add to your food: alfredo sauce, cheese sauce, barbeque sauce, ketchup, fat back, butter, bacon grease, grease, Crisco, OR SUGAR   Handouts Provided Include   Phase 4  Learning Style & Readiness for Change Teaching method utilized: Visual & Auditory  Demonstrated degree of understanding via: Teach Back  Readiness Level: Action Barriers to learning/adherence to lifestyle change: back pain  RD's Notes for Next Visit . Assess adherence to pt chosen goals   MONITORING & EVALUATION Dietary intake, weekly physical activity, body weight  Next Steps Patient is to follow-up in 3-4 months

## 2020-07-04 NOTE — Progress Notes (Signed)
Cardiology Office Note:    Date:  7/0/1410   ID:  [name], DOB [birth year], MRN [id number]  PCP:  [name], MD  Cardiologist:  [name], MD PhD  Referring MD: [name], MD   CC: follow up  History of Present Illness:    [name] is a 55 y.o. female with chronic diastolic heart failure, chronic hypoxic respiratory failure, COPD, OSA on PAP, morbid obesity, paroxysmal SVT, type II diabetes on insulin, CKD stage 3, hypertension, hyperlipidemia  Cardiac history: Complex, see my initial notes. Has struggled with diastolic heart failure, has chest pai when she gets fluid overloaded. Was on oxygen, having "breathing crises" that seemed to be related to paroxysmal SVT. She was well controlled for many months, then had an episode requiring hospitalization 08/2018 for chest pain and elevated weight. HsTn unremarkable (6 -> 3), no SVT on monitor. BNP 41.7. Covid negative. Was diuresed, lost fluid, swelling on left side went down while she was laying down but came back when she got back up. She attributes the episode to a myelogram, gained 16 lbs and started having symptoms.  Today:  Her main complaints are right knee, back, and hip pain due to her surgeries. She successfully lost over 50 pounds before her hip replacement. Her breathing has significantly improved, and she has not been on oxygen for the past year. She notes her palpitations have resolved as well.  For her diet, she is eating more vegetables. Since her last visit she is down to needing 3 units of insulin from 50 units. For exercise, she intends to go the [facility] and use the pool. However, she is severely limited by her pain.  Currently she is back on Chantix, and is motivated to quit smoking again before her birthday.  She denies any chest pain, shortness of breath, or exertional symptoms. No headaches, lightheadedness, or syncope to report. Also has no lower extremity edema, orthopnea or  PND.  She asks about a lesion on her upper back, which is indicative of scar tissue.  Past Medical History:  Diagnosis Date  . Alcoholism ([place])   . Anxiety   . Arthritis 04-10-11   hips, shoulders, back  . Asthma   . Bipolar disorder ([place])   . Cardiomegaly   . Carpal tunnel syndrome, bilateral   . Cervical cancer ([place]) 1993   cervical, no treatment done, went away per pt  . Cervical dysplasia or atypia 04-10-11   '93- once dx.-got pregnant-no intervention, then postpartum, no dysplasia found  . CHF (congestive heart failure) ([place])    no cardiologist 2014 dx, none now  . Chronic hypoxemic respiratory failure ([place])   . Chronic kidney disease    stage 3 kidney disease  . 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 Pathology showed Condyloma, gluteal CONDYLOMA ACUMINATUM.   [name] Kitchen COPD (chronic obstructive pulmonary disease) ([place])   . Depression   . Diabetes mellitus without complication ([place])   . Dyspnea    with actity, sitting  . Fall 12/16/2016  . Fibrocystic breast disease   . GERD (gastroesophageal reflux disease)   . Grade I diastolic dysfunction 82/06/154   Noted on ECHO  . Hepatitis    hep B-count is low at present,doesn't register  . History of PSVT (paroxysmal supraventricular tachycardia)   . Hypercalcemia   . Hyperlipidemia   . Hypertension   . Incomplete left bundle branch block (LBBB) 09/24/2018   Noted on EKG  . Lung nodule 11/2017  right and left lung  . Migraine headache    none recent  . Morbid obesity ([place]) 03/27/2006  . Neuropathy   . Pneumonia    walking pneu 15 yrs. ago  . Skin lesion 03/15/2011   In gluteal crease now s/p removal by Dr. [name] of General Surgery on 3/12. Path shows condyloma.     . Sleep apnea 04-10-11   uses cpap, pt does not know settings  . Trigger finger    left third  . Urge incontinence of urine     Past Surgical History:  Procedure Laterality Date  . ANAL FISTULECTOMY  04/17/2011   Procedure:  FISTULECTOMY ANAL;  Surgeon: Imogene Burn. [name], MD;  Location: [facility];  Service: General;  Laterality: N/A;  Excision of Condyloma Gluteal Cleft   . ANAL RECTAL MANOMETRY N/A 03/26/2019   Procedure: ANO RECTAL MANOMETRY;  Surgeon: Thornton Park, MD;  [facility];  Service: Gastroenterology;  Laterality: N/A;  . BIOPSY  03/09/2019   Procedure: BIOPSY;  Surgeon: Thornton Park, MD;  [facility];  Service: Gastroenterology;;  . Lum [place] INJECTION N/A 12/29/2018   Procedure: BOTOX INJECTION(100 UNITS)  WITH CYSTOSCOPY;  Surgeon: Ceasar Mons, MD;  [facility];  Service: Urology;  Laterality: N/A;  . BOTOX INJECTION N/A 07/27/2019   Procedure: BOTOX INJECTION WITH CYSTOSCOPY;  Surgeon: Ceasar Mons, MD;  [facility];  Service: Urology;  Laterality: N/A;  ONLY NEEDS 30 MIN  . BREAST CYST EXCISION     bilateral breast, 3 cysts removed from each breast  . BREAST EXCISIONAL BIOPSY Right    x 3  . BREAST EXCISIONAL BIOPSY Left    x 3  . CARPAL TUNNEL RELEASE Bilateral   . Cervical biospy    . CERVICAL FUSION  04-10-11   x3- cervical fusion with plating and screws-Dr. [name]  . COLONOSCOPY WITH PROPOFOL N/A 07/06/2015   Procedure: COLONOSCOPY WITH PROPOFOL;  Surgeon: [name], MD;  [facility];  Service: Endoscopy;  Laterality: N/A;  . COLONOSCOPY WITH PROPOFOL N/A 03/09/2019   Procedure: COLONOSCOPY WITH PROPOFOL;  Surgeon: Thornton Park, MD;  [facility];  Service: Gastroenterology;  Laterality: N/A;  . DOPPLER ECHOCARDIOGRAPHY  04/06/2012   AT [place] 55-60%  . FOOT SURGERY Left 2018  . FRACTURE SURGERY     little toe left foot  . I & D EXTREMITY Left 12/18/2016   Procedure: IRRIGATION AND DEBRIDEMENT LEFT FOOT, CLOSURE;  Surgeon: [name], MD;  Location: [place];  Service: Orthopedics;  Laterality: Left;  . LAPAROSCOPIC GASTRIC SLEEVE RESECTION N/A 05/08/2020   Procedure: LAPAROSCOPIC GASTRIC SLEEVE  RESECTION;  Surgeon: Kieth Brightly Arta Bruce, MD;  [facility];  Service: General;  Laterality: N/A;  . LUMBAR [place] SURGERY  04-10-11   x5-Lumbar fusion-retained hardware.(Dr. Louanne Skye)  . MULTIPLE TOOTH EXTRACTIONS    . POLYPECTOMY  03/09/2019   Procedure: POLYPECTOMY;  Surgeon: Thornton Park, MD;  [facility];  Service: Gastroenterology;;  . SHOULDER ARTHROSCOPY WITH ROTATOR CUFF REPAIR AND SUBACROMIAL DECOMPRESSION Left 05/07/2019   Procedure: LEFT SHOULDER ARTHROSCOPY WITH DEBRIDEMENT, DISTAL CLAVICLE EXCISION,  SUBACROMIAL DECOMPRESSION AND POSSIBLE ROTATOR CUFF REPAIR;  Surgeon: [name], MD;  Location: [place];  Service: Orthopedics;  Laterality: Left;  . TEE WITHOUT CARDIOVERSION N/A 04/06/2012   Procedure: TRANSESOPHAGEAL ECHOCARDIOGRAM (TEE);  Surgeon: [name], MD;  Location: [facility];  Service: Cardiovascular;  Laterality: N/A;  . TRIGGER FINGER RELEASE Right    middle finger  .  TRIGGER FINGER RELEASE Left 06/28/2016   Procedure: RELEASE TRIGGER FINGER LEFT 3RD FINGER;  Surgeon: [name], MD;  Location: [place];  Service: Orthopedics;  Laterality: Left;  . TRIGGER FINGER RELEASE Right 06/12/2016   Procedure: RIGHT INDEX FINGER TRIGGER RELEASE;  Surgeon: [name], MD;  Location: [place];  Service: Orthopedics;  Laterality: Right;  . TRIGGER FINGER RELEASE Right 01/19/2018   Procedure: RIGHT RING FINGER TRIGGER FINGER RELEASE;  Surgeon: [name], MD;  Location: [place];  Service: Orthopedics;  Laterality: Right;  . TRIGGER FINGER RELEASE Left 05/07/2019   Procedure: RELEASE TRIGGER FINGER LEFT INDEX FINGER;  Surgeon: [name], MD;  Location: [place];  Service: Orthopedics;  Laterality: Left;  . UPPER GI ENDOSCOPY N/A 05/08/2020   Procedure: UPPER GI ENDOSCOPY;  Surgeon: Kieth Brightly, Arta Bruce, MD;  [facility];  Service: General;  Laterality: N/A;    Current Medications: Current Outpatient Medications on File Prior to Visit  Medication Sig  .  Accu-Chek Softclix Lancets lancets USE TO TEST 4 TIMES A DAY  . acetaminophen (TYLENOL) 500 MG tablet Take 1,000 mg by mouth 2 (two) times daily.  [name] Kitchen albuterol (VENTOLIN HFA) 108 (90 Base) MCG/ACT inhaler Inhale 2 puffs into the lungs every 6 (six) hours as needed for wheezing or shortness of breath.  . allopurinol (ZYLOPRIM) 300 MG tablet Take 300 mg by mouth in the morning.  [name] Kitchen ammonium lactate (LAC-HYDRIN) 12 % lotion APPLY TWICE A DAY AS NEEDED FOR DRY SKIN (Patient taking differently: Apply 1 application topically in the morning and at bedtime.)  . atorvastatin (LIPITOR) 40 MG tablet TAKE 1 TABLET BY MOUTH EVERY DAY (Patient taking differently: Take 40 mg by mouth in the morning.)  . b complex vitamins capsule Take 1 capsule by mouth daily.  . B-D UF III MINI PEN NEEDLES 31G X 5 MM MISC CHECK SUGARS 4 TIMES A DAY BEFORE MEALS AND AT BEDTIME.  . baclofen (LIORESAL) 10 MG tablet Take 1 tablet (10 mg total) by mouth 3 (three) times daily as needed for muscle spasms. (Patient taking differently: Take 10 mg by mouth 3 (three) times daily.)  . blood glucose meter kit and supplies KIT Dispense based on patient and insurance preference. Use up to four times daily as directed. (FOR ICD-9 250.00, 250.01).  . busPIRone (BUSPAR) 10 MG tablet Take 1 tablet (10 mg total) by mouth 3 (three) times daily.  . diclofenac Sodium (VOLTAREN) 1 % GEL APPLY 2 GRAMS TO AFFECTED AREA 4 TIMES A DAY  . diltiazem (CARDIZEM CD) 120 MG 24 hr capsule TAKE 1 CAPSULE (120 MG TOTAL) BY MOUTH 2 (TWO) TIMES DAILY.  [name] Kitchen docusate sodium (COLACE) 100 MG capsule Take 200 mg by mouth 2 (two) times daily.  [name] Kitchen enoxaparin (LOVENOX) 40 MG/0.4ML injection Inject 0.4 mLs (40 mg total) into the skin daily.  . febuxostat (ULORIC) 40 MG tablet Take 40 mg by mouth daily.  . fluticasone (FLONASE) 50 MCG/ACT nasal spray Place 2 sprays into both nostrils 2 (two) times daily.  . Fluticasone-Umeclidin-Vilant (TRELEGY ELLIPTA) 100-62.5-25 MCG/INH AEPB  Inhale 1 puff into the lungs daily.  [name] Kitchen gabapentin (NEURONTIN) 300 MG capsule Take 2 capsules (600 mg total) by mouth 3 (three) times daily. TAKE 2 CAPSULES (600 MG TOTAL) BY MOUTH in the morning and at lunch and take 3 capsules at night (Patient taking differently: Take 600 mg by mouth 3 (three) times daily.)  . glucose blood (ACCU-CHEK AVIVA PLUS) test strip 1 each  by Other route 3 (three) times daily. TEST 3 TIMES DAILY  . hydrOXYzine (ATARAX/VISTARIL) 10 MG tablet Take 1 tablet (10 mg total) by mouth daily.  . insulin lispro (HUMALOG) 100 UNIT/ML KwikPen Inject 0-20 Units into the skin 3 (three) times daily before meals. CBG < 70: Drink protein shake or broth for hypoglycemia CBG 70 - 120: 0 units  CBG 121 - 150: 3 units  CBG 151 - 200: 4 units  CBG 201 - 250: 7 units  CBG 251 - 300: 11 units  CBG 301 - 350: 15 units  CBG 351 - 400: 20 units  CBG > 400 call MD  . ketoconazole (NIZORAL) 2 % cream APPLY 1 FINGERTIP AMOUNT TO EACH FOOT DAILY. (Patient taking differently: Apply 1 application topically in the morning, at noon, and at bedtime.)  . loratadine (CLARITIN) 10 MG tablet Take 1 tablet (10 mg total) by mouth daily.  [name] Kitchen losartan (COZAAR) 100 MG tablet Take 1 tablet (100 mg total) by mouth daily.  . metolazone (ZAROXOLYN) 2.5 MG tablet TAKE 1 TABLET EVERY OTHER DAY. TAKE 1 TABLET TWICE WEEKLY ON MONDAYS AND THURSDAYS ONLY  . montelukast (SINGULAIR) 10 MG tablet TAKE 1 TABLET BY MOUTH EVERYDAY AT BEDTIME (Patient taking differently: Take 10 mg by mouth at bedtime.)  . Multiple Vitamins-Minerals (MULTIVITAMIN WITH MINERALS) tablet Take 1 tablet by mouth daily.  [name] Kitchen NARCAN 4 MG/0.1ML LIQD nasal spray kit Place 1 spray into the nose as needed (accidental overdose).  . NON FORMULARY Uses a C-PAP at bedtime  . nystatin (MYCOSTATIN) 100000 UNIT/ML suspension TAKE 5 MLS (500,000 UNITS TOTAL) BY MOUTH 4 (FOUR) TIMES DAILY. SWISH AND SWALLOW. (Patient taking differently: Take 5 mLs by mouth 4 (four)  times daily as needed (thrush). Swish and swallow.)  . Omega-3 Fatty Acids (FISH OIL) 1000 MG CAPS Take 1,000 mg by mouth in the morning.  [name] Kitchen omeprazole (PRILOSEC) 20 MG capsule TAKE 1 CAPSULE BY MOUTH EVERY DAY  . ondansetron (ZOFRAN-ODT) 4 MG disintegrating tablet Take 1 tablet (4 mg total) by mouth every 6 (six) hours as needed for nausea or vomiting.  . Semaglutide,0.25 or 0.5MG/DOS, (OZEMPIC, 0.25 OR 0.5 MG/DOSE,) 2 MG/1.5ML SOPN Inject 0.25 mg into the skin once a week. Minus 2 clicks (Patient taking differently: Inject 0.25 mg into the skin every Thursday. Minus 2 clicks)  . varenicline (CHANTIX) 1 MG tablet Take 1 tablet (1 mg total) by mouth 2 (two) times daily.  [name] Kitchen venlafaxine XR (EFFEXOR-XR) 75 MG 24 hr capsule TAKE 1 CAPSULE BY MOUTH DAILY WITH BREAKFAST. (Patient taking differently: Take 75 mg by mouth daily with breakfast.)   Current Facility-Administered Medications on File Prior to Visit  Medication  . technetium tetrofosmin (TC-MYOVIEW) injection 16.3 millicurie     Allergies:   Aspirin, Methadone hcl, Corticosteroids, and Levofloxacin   Social History   Tobacco Use  . Smoking status: Current Every Day Smoker    Packs/day: 0.20    Years: 40.00    Pack years: 8.00    Types: Cigarettes    Start date: 01/28/1977  . Smokeless tobacco: Never Used  . Tobacco comment: 6 cigarettes per day  Vaping Use  . Vaping Use: Never used  Substance Use Topics  . Alcohol use: No    Alcohol/week: 0.0 standard drinks  . Drug use: Not Currently    Types: Marijuana, "Crack" cocaine    Comment: hx marijuana usemany years ago, Crack -none since 11/2015    Family History: The patient's family history includes  Alcohol abuse in her father; Arthritis in her sister; Asthma in her sister; Breast cancer in her maternal aunt; Depression in her sister; Diabetes in an other family member; Hypertension in her father and another family member; Pneumonia in her father; Pulmonary embolism in her mother;  Sickle cell anemia in her son; Stroke in her father. no CAD known. Father died age 27 from stroke, mother died age 72 of blood clot. Has a 97 year old child with sickle cell disease.  ROS:   Please see the history of present illness.   (+) Right back pain (+) Right hip pain (+) Right knee pain Additional pertinent ROS otherwise unremarkable.  EKGs/Labs/Other Studies Reviewed:    The following studies were reviewed today:  Stress test 04/09/19  The left ventricular ejection fraction is mildly decreased (45-54%).  Nuclear stress EF: 47%.  There was no ST segment deviation noted during stress.  This is an intermediate risk study due to reduced systolic function. There is no ischemia.  New wall motion abnormalities noted. Consider echo if clinically warranted.  Monitor 12/18/17 3 days of rhythm interpreted on Zio patch. Patient had a min HR of 58 bpm, max HR of 193 bpm, and avg HR of 81 bpm. Predominant underlying rhythm was Sinus Rhythm. 217 Supraventricular Tachycardia runs occurred, the run with the fastest interval lasting 6 beats with a max rate of 193 bpm, the longest lasting 1 min 13 secs with an avg rate of 151 bpm. True duration of Supraventricular Tachycardia difficult to ascertain due to Artifact. Thirteen triggered patient events recorded. Supraventricular Tachycardia was detected within +/- 45 seconds of symptomatic patient event(s). Isolated SVEs were occasional (2.0%, 6813), SVE Couplets were rare (<1.0%, 235), and SVE Triplets were rare (<1.0%, 66). No significant ventricular ectopy noted.  Echo 9.4.19 Study Conclusions  - Left ventricle: The cavity size was normal. Wall thickness was   normal. Systolic function was normal. The estimated ejection   fraction was in the range of 55% to 60%. Wall motion was normal;   there were no regional wall motion abnormalities. Doppler   parameters are consistent with abnormal left ventricular   relaxation (grade 1 diastolic  dysfunction).  Lexiscan MPI 11/11/17 Study Highlights    Nuclear stress EF: 71%.  The left ventricular ejection fraction is hyperdynamic (>65%).  There was no ST segment deviation noted during stress.  There is a small defect of mild severity present in the apical septal location. The defect is non-reversible. No ischemia noted.  This is a low risk study.   EKG:  EKG is personally reviewed today.    07/05/2020: EKG is not ordered today. 07/06/19 demonstrates normal sinus rhythm at 77 bpm  Recent Labs: 05/09/2020: ALT 38; BUN 33; Creatinine, Ser 1.19; Potassium 3.3; Sodium 136 05/10/2020: Hemoglobin 15.0; Platelets 281  Recent Lipid Panel    Component Value Date/Time   CHOL 142 06/28/2020 1004   TRIG 130 06/28/2020 1004   HDL 46 06/28/2020 1004   CHOLHDL 3.1 06/28/2020 1004   CHOLHDL 3.9 09/24/2018 2336   VLDL 33 09/24/2018 2336   LDLCALC 73 06/28/2020 1004   LDLDIRECT 114 (H) 09/15/2012 1559    Physical Exam:    VS:  BP (!) 142/72 (BP Location: Left Arm, Patient Position: Sitting)   Pulse 88   Ht '5\' 9"'  (1.753 m)   Wt (!) 329 lb (149.2 kg)   LMP 04/10/2006   SpO2 (!) 88%   BMI 48.58 kg/m     Wt Readings from  Last 3 Encounters:  07/05/20 (!) 329 lb (149.2 kg)  07/03/20 (!) 329 lb 12.8 oz (149.6 kg)  06/30/20 (!) 329 lb 6.4 oz (149.4 kg)   GEN: Well nourished, well developed in no acute distress. HEENT: Normal, moist mucous membranes NECK: No JVD visible CARDIAC: regular rhythm, normal S1 and S2, no rubs or gallops. No murmur. VASCULAR: Radial and DP pulses 2+ bilaterally. No carotid bruits RESPIRATORY:  Distant but clear to auscultation without rales, wheezing or rhonchi  ABDOMEN: Soft, non-tender, non-distended MUSCULOSKELETAL:  Ambulates independently with rollator SKIN: Warm and dry, no significant LE edema NEUROLOGIC:  Alert and oriented x 3. No focal neuro deficits noted. PSYCHIATRIC:  Normal affect   ASSESSMENT:    1. Chronic diastolic CHF (congestive  heart failure) ([place])   2. Morbid obesity ([place])   3. Essential hypertension   4. Type 2 diabetes mellitus with stage 3a chronic kidney disease, with long-term current use of insulin ([place])   5. Tobacco abuse counseling   6. Paroxysmal SVT (supraventricular tachycardia) (H[facility])   7. Counseling on health promotion and disease prevention    PLAN:    Chronic diastolic heart failure:stable NYHA class II symptoms today. Weights decreasing post gastric surgery and lifestyle change -prior regimen of furosemide 80 mg BID and metolazone 2.5 mg twice weekly (with potassium), now PRN -very strict on diet, daily weights, monitoring for symptoms.   Paroxysmal SVT:no recent symptoms -continue diltiazem  Type II diabetes with stage 3a CKD: on insulin and jardiance -insulin dose has decreased significantly post surgery and weight loss, congratulated  Hypertension: elevated but improved on recheck -she will continue to monitor. Goal <130/80. -continue dilitazem, losartan  Hypercholesterolemia: continue atorvastatin, lipids as above  Tobacco cessation counseling: The patient was counseled on tobacco cessation today for 3 minutes.  Counseling included reviewing the risks of smoking tobacco products, how it impacts the patient's current medical diagnoses and different strategies for quitting.  Pharmacotherapy to aid in tobacco cessation was not prescribed today.  Class 3 obesity, BMI 58: doing an excellent job with weight loss. Peak weight nearly 420 lbs, lost 57 lbs prior to surgery and has continued to lose weight since bariatric surgery 04/2020. Congratulated her. Agree with her plans to do water aerobics given her joint issues.  OSA on PAP, obesity hypoventilation syndrome, chronic hypoxemic respiratory failure on oxygen: followed by pulmonology  Prevention and cardiac disease counseling, including heart failure education:   The 10-year ASCVD risk score Mikey Bussing DC Brooke Bonito., et al., 2013) is: 27%    Values used to calculate the score:     Age: 52 years     Sex: Female     Is Non-Hispanic African American: Yes     Diabetic: Yes     Tobacco smoker: Yes     Systolic Blood Pressure: 694 mmHg     Is BP treated: Yes     HDL Cholesterol: 46 mg/dL     Total Cholesterol: 142 mg/dL  Plan for follow up: 6 mos  [name], MD, PhD [place]  [facility]   Medication Adjustments/Labs and Tests Ordered: Current medicines are reviewed at length with the patient today.  Concerns regarding medicines are outlined above.  No orders of the defined types were placed in this encounter.  No orders of the defined types were placed in this encounter.   Patient Instructions  Medication Instructions:  Your Physician recommend you continue on your current medication as directed.    *If you need a refill  on your cardiac medications before your next appointment, please call your pharmacy*   Lab Work: None ordered today   Testing/Procedures: None ordered today   Follow-Up: At [facility], you and your health needs are our priority.  As part of our continuing mission to provide you with exceptional heart care, we have created designated Provider Care Teams.  These Care Teams include your primary Cardiologist (physician) and Advanced Practice Providers (APPs -  Physician Assistants and Nurse Practitioners) who all work together to provide you with the care you need, when you need it.  We recommend signing up for the patient portal called "MyChart".  Sign up information is provided on this After Visit Summary.  MyChart is used to connect with patients for Virtual Visits (Telemedicine).  Patients are able to view lab/test results, encounter notes, upcoming appointments, etc.  Non-urgent messages can be sent to your provider as well.   To learn more about what you can do with MyChart, go to [email or web address].    Your next appointment:   6 month(s) @ [address] [employer] [place], [place] [address]   The format for your next appointment:   In Person  Provider:   [name], MD        [facility],acting as a scribe for [name], MD.,have documented all relevant documentation on the behalf of [name], MD,as directed by  [name], MD while in the presence of [name], MD.  I, [name], MD, have reviewed all documentation for this visit. The documentation on 07/05/20 for the exam, diagnosis, procedures, and orders are all accurate and complete.  Signed, [name], MD PhD 07/05/2020  [place]

## 2020-07-05 ENCOUNTER — Ambulatory Visit (INDEPENDENT_AMBULATORY_CARE_PROVIDER_SITE_OTHER): Payer: 59 | Admitting: Cardiology

## 2020-07-05 ENCOUNTER — Encounter: Payer: Self-pay | Admitting: Cardiology

## 2020-07-05 ENCOUNTER — Other Ambulatory Visit: Payer: Self-pay

## 2020-07-05 VITALS — BP 142/72 | HR 88 | Ht 69.0 in | Wt 329.0 lb

## 2020-07-05 DIAGNOSIS — Z716 Tobacco abuse counseling: Secondary | ICD-10-CM

## 2020-07-05 DIAGNOSIS — Z794 Long term (current) use of insulin: Secondary | ICD-10-CM

## 2020-07-05 DIAGNOSIS — I5032 Chronic diastolic (congestive) heart failure: Secondary | ICD-10-CM

## 2020-07-05 DIAGNOSIS — I10 Essential (primary) hypertension: Secondary | ICD-10-CM | POA: Diagnosis not present

## 2020-07-05 DIAGNOSIS — E1122 Type 2 diabetes mellitus with diabetic chronic kidney disease: Secondary | ICD-10-CM | POA: Diagnosis not present

## 2020-07-05 DIAGNOSIS — N1831 Chronic kidney disease, stage 3a: Secondary | ICD-10-CM

## 2020-07-05 DIAGNOSIS — Z7189 Other specified counseling: Secondary | ICD-10-CM

## 2020-07-05 DIAGNOSIS — F17200 Nicotine dependence, unspecified, uncomplicated: Secondary | ICD-10-CM | POA: Diagnosis not present

## 2020-07-05 DIAGNOSIS — I471 Supraventricular tachycardia: Secondary | ICD-10-CM

## 2020-07-05 NOTE — Patient Instructions (Signed)
Medication Instructions:  Your Physician recommend you continue on your current medication as directed.    *If you need a refill on your cardiac medications before your next appointment, please call your pharmacy*   Lab Work: None ordered today   Testing/Procedures: None ordered today   Follow-Up: At [facility], you and your health needs are our priority.  As part of our continuing mission to provide you with exceptional heart care, we have created designated Provider Care Teams.  These Care Teams include your primary Cardiologist (physician) and Advanced Practice Providers (APPs -  Physician Assistants and Nurse Practitioners) who all work together to provide you with the care you need, when you need it.  We recommend signing up for the patient portal called "MyChart".  Sign up information is provided on this After Visit Summary.  MyChart is used to connect with patients for Virtual Visits (Telemedicine).  Patients are able to view lab/test results, encounter notes, upcoming appointments, etc.  Non-urgent messages can be sent to your provider as well.   To learn more about what you can do with MyChart, go to [email or web address].    Your next appointment:   6 month(s) @ [address] , [place] [address]   The format for your next appointment:   In Person  Provider:   Bridgette Christopher, MD     

## 2020-07-06 ENCOUNTER — Telehealth: Payer: Self-pay | Admitting: Pharmacist

## 2020-07-06 NOTE — Telephone Encounter (Signed)
Noted and agree. 

## 2020-07-06 NOTE — Telephone Encounter (Signed)
-----   Message from Leavy Cella, [place] sent at 06/21/2020 12:09 PM EDT ----- Regarding: Tobacco Intake reduction

## 2020-07-06 NOTE — Telephone Encounter (Signed)
Patient contacted for follow/up of tobacco intake reduction / tobacco cessation attempt.   Since last contact patient reports  continued smoking of ~ 6 per day.    Medications currently being used;  varenicline,     Motivation to quit: health and breathing    Total time with patient call and documentation of interaction: 8 minutes  F/U Phone call planned: 6/28

## 2020-07-07 ENCOUNTER — Ambulatory Visit: Payer: Medicare Other | Admitting: Cardiology

## 2020-07-07 ENCOUNTER — Other Ambulatory Visit: Payer: Self-pay | Admitting: Family Medicine

## 2020-07-07 ENCOUNTER — Other Ambulatory Visit: Payer: Self-pay | Admitting: Podiatry

## 2020-07-07 DIAGNOSIS — B353 Tinea pedis: Secondary | ICD-10-CM

## 2020-07-07 DIAGNOSIS — E1151 Type 2 diabetes mellitus with diabetic peripheral angiopathy without gangrene: Secondary | ICD-10-CM

## 2020-07-12 ENCOUNTER — Other Ambulatory Visit: Payer: Self-pay

## 2020-07-17 ENCOUNTER — Ambulatory Visit (INDEPENDENT_AMBULATORY_CARE_PROVIDER_SITE_OTHER): Payer: 59 | Admitting: Family Medicine

## 2020-07-17 ENCOUNTER — Other Ambulatory Visit: Payer: Self-pay

## 2020-07-17 ENCOUNTER — Encounter: Payer: Self-pay | Admitting: Family Medicine

## 2020-07-17 VITALS — BP 122/70 | HR 94 | Wt 324.0 lb

## 2020-07-17 DIAGNOSIS — Z0000 Encounter for general adult medical examination without abnormal findings: Secondary | ICD-10-CM

## 2020-07-17 NOTE — Patient Instructions (Signed)
It was great seeing you today.  I completed your annual physical today but you still need to schedule your annual wellness visit with [facility].  Please do this on your way out.  I am glad you are doing so well.  Keep up the good work on your weight loss as well as your smoking cessation.  I hope you have a wonderful afternoon!
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
## 2020-07-18 ENCOUNTER — Ambulatory Visit
Admission: RE | Admit: 2020-07-18 | Discharge: 2020-07-18 | Disposition: A | Discharge status: Home / Self Care | Payer: 59 | Source: Ambulatory Visit | Attending: Specialist | Admitting: Specialist

## 2020-07-18 ENCOUNTER — Ambulatory Visit
Admission: RE | Admit: 2020-07-18 | Discharge: 2020-07-18 | Disposition: A | Discharge status: Home / Self Care | Payer: 59 | Source: Ambulatory Visit | Attending: Pulmonary Disease | Admitting: Pulmonary Disease

## 2020-07-18 ENCOUNTER — Other Ambulatory Visit: Payer: Self-pay | Admitting: Pulmonary Disease

## 2020-07-18 DIAGNOSIS — Z4889 Encounter for other specified surgical aftercare: Secondary | ICD-10-CM

## 2020-07-18 DIAGNOSIS — R911 Solitary pulmonary nodule: Secondary | ICD-10-CM

## 2020-07-18 DIAGNOSIS — M4807 Spinal stenosis, lumbosacral region: Secondary | ICD-10-CM

## 2020-07-18 MED ORDER — GADOBENATE DIMEGLUMINE 529 MG/ML IV SOLN
20.0000 mL | Freq: Once | INTRAVENOUS | Status: AC | PRN
  Administered 2020-07-18: 20 mL via INTRAVENOUS

## 2020-07-19 ENCOUNTER — Encounter: Payer: Self-pay | Admitting: Family Medicine

## 2020-07-19 ENCOUNTER — Telehealth: Payer: Self-pay

## 2020-07-19 NOTE — Telephone Encounter (Signed)
FYI- MRI Lumbar results are in patient's chart.  Please advise.  Thank you

## 2020-07-20 ENCOUNTER — Ambulatory Visit (INDEPENDENT_AMBULATORY_CARE_PROVIDER_SITE_OTHER): Payer: 59

## 2020-07-20 ENCOUNTER — Other Ambulatory Visit: Payer: Self-pay

## 2020-07-20 VITALS — Ht 69.0 in | Wt 325.0 lb

## 2020-07-20 DIAGNOSIS — Z0000 Encounter for general adult medical examination without abnormal findings: Secondary | ICD-10-CM | POA: Diagnosis not present

## 2020-07-20 NOTE — Progress Notes (Addendum)
Subjective:   [name] is a 55 y.o. female who presents for Medicare Annual (Subsequent) preventive examination.  Review of Systems: Defer to PCP.   Cardiac Risk Factors include: diabetes mellitus;obesity (BMI >30kg/m2);sedentary lifestyle;smoking/ tobacco exposure  Objective:   Vitals: Ht _0  (1.753 m)   Wt (!) 325 lb (147.4 kg)   LMP 04/10/2006   BMI 47.99 kg/m   Body mass index is 47.99 kg/m.  Advanced Directives 07/20/2020 07/20/2020 06/28/2020 05/08/2020 04/28/2020 03/17/2020 01/11/2020  Does Patient Have a Medical Advance Directive? Yes _1  No  Type of Advance Directive [place]  Does patient want to make changes to medical advance directive? - - - Yes (Inpatient - patient requests chaplain consult to change a medical advance directive) - - -  Copy of [place] in Chart? Yes - validated most recent copy scanned in chart (See row information) - - - - - -  Would patient like information on creating a medical advance directive? No - Patient declined No - Patient declined No - Patient declined Yes (Inpatient - patient requests chaplain consult to create a medical advance directive) - No - Patient declined No - Patient declined  Pre-existing out of facility DNR order (yellow form or pink MOST form) - - - - - - -   Tobacco Social History   Tobacco Use  Smoking Status Every Day   Packs/day: 0.20   Years: 40.00   Pack years: 8.00   Types: Cigarettes   Start date: 01/28/1977  Smokeless Tobacco Never  Tobacco Comments   Smoked 2 ciggs on 07/17/2020 has not smoked since     Ready to quit: yes  Counseling given: yes  Tobacco comments: Smoked 2 ciggs on 07/17/2020 has not smoked since Patient is using nicotine patches  Clinical Intake:  Pre-visit preparation completed: Yes  Pain Score: 10-Worst pain ever  How often do you need to have someone help you when you read  instructions, pamphlets, or other written materials from your doctor or pharmacy?: 2 - Rarely What is the last grade level you completed in school?: High School  Interpreter Needed?: No  Past Medical History:  Diagnosis Date   Alcoholism ([place])    Anxiety    Arthritis 04-10-11   hips, shoulders, back   Asthma    Bipolar disorder ([place])    Cardiomegaly    Carpal tunnel syndrome, bilateral    Cervical cancer () 1993   cervical, no treatment done, went away per pt   Cervical dysplasia or atypia 04-10-11   '93- once dx.-got pregnant-no intervention, then postpartum, no dysplasia found   CHF (congestive heart failure) ([place])    no cardiologist 2014 dx, none now   Chronic hypoxemic respiratory failure ([place])    Chronic kidney disease    stage 3 kidney disease   Condyloma - gluteal cleft 04/09/2011   Removed by general surgery. Pathology showed Condyloma, gluteal CONDYLOMA ACUMINATUM.    COPD (chronic obstructive pulmonary disease) ([place])    Depression    Diabetes mellitus without complication (HCC)    Dyspnea    with actity, sitting   Fall 12/16/2016   Fibrocystic breast disease    GERD (gastroesophageal reflux disease)    Grade I diastolic dysfunction 83/38/2505   Noted on ECHO   Hepatitis    hep B-count is low at present,doesn't register   History of PSVT (paroxysmal supraventricular tachycardia)  Hypercalcemia    Hyperlipidemia    Hypertension    Incomplete left bundle branch block (LBBB) 09/24/2018   Noted on EKG   Lung nodule 11/2017   right and left lung   Migraine headache    none recent   Morbid obesity ([place]) 03/27/2006   Neuropathy    Pneumonia    walking pneu 15 yrs. ago   Skin lesion 03/15/2011   In gluteal crease now s/p removal by Dr. [name] of General Surgery on 3/12. Path shows condyloma.      Sleep apnea 04-10-11   uses cpap, pt does not know settings   Trigger finger    left third   Urge incontinence of urine    Past Surgical History:  Procedure  Laterality Date   ANAL FISTULECTOMY  04/17/2011   Procedure: FISTULECTOMY ANAL;  Surgeon: Imogene Burn. [name], MD;  Location: [facility];  Service: General;  Laterality: N/A;  Excision of Condyloma Gluteal Cleft    ANAL RECTAL MANOMETRY N/A 03/26/2019   Procedure: ANO RECTAL MANOMETRY;  Surgeon: Thornton Park, MD;  [facility];  Service: Gastroenterology;  Laterality: N/A;   BIOPSY  03/09/2019   Procedure: BIOPSY;  Surgeon: Thornton Park, MD;  [facility];  Service: Gastroenterology;;   BOTOX INJECTION N/A 12/29/2018   Procedure: BOTOX INJECTION(100 UNITS)  WITH CYSTOSCOPY;  Surgeon: Ceasar Mons, MD;  [facility];  Service: Urology;  Laterality: N/A;   BOTOX INJECTION N/A 07/27/2019   Procedure: BOTOX INJECTION WITH CYSTOSCOPY;  Surgeon: Ceasar Mons, MD;  [facility];  Service: Urology;  Laterality: N/A;  ONLY NEEDS 30 MIN   BREAST CYST EXCISION     bilateral breast, 3 cysts removed from each breast   BREAST EXCISIONAL BIOPSY Right    x 3   BREAST EXCISIONAL BIOPSY Left    x 3   CARPAL TUNNEL RELEASE Bilateral    Cervical biospy     CERVICAL FUSION  04-10-11   x3- cervical fusion with plating and screws-Dr. [name]   COLONOSCOPY WITH PROPOFOL N/A 07/06/2015   Procedure: COLONOSCOPY WITH PROPOFOL;  Surgeon: [name], MD;  [facility];  Service: Endoscopy;  Laterality: N/A;   COLONOSCOPY WITH PROPOFOL N/A 03/09/2019   Procedure: COLONOSCOPY WITH PROPOFOL;  Surgeon: Thornton Park, MD;  [facility];  Service: Gastroenterology;  Laterality: N/A;   DOPPLER ECHOCARDIOGRAPHY  04/06/2012   AT [place] 55-60%   FOOT SURGERY Left 2018   FRACTURE SURGERY     little toe left foot   I & D EXTREMITY Left 12/18/2016   Procedure: IRRIGATION AND DEBRIDEMENT LEFT FOOT, CLOSURE;  Surgeon: [name], MD;  Location: [place];  Service: Orthopedics;  Laterality: Left;   LAPAROSCOPIC GASTRIC SLEEVE RESECTION N/A 05/08/2020    Procedure: LAPAROSCOPIC GASTRIC SLEEVE RESECTION;  Surgeon: Kieth Brightly Arta Bruce, MD;  [facility];  Service: General;  Laterality: N/A;   LUMBAR [place] SURGERY  04-10-11   x5-Lumbar fusion-retained hardware.(Dr. Louanne Skye)   MULTIPLE TOOTH EXTRACTIONS     POLYPECTOMY  03/09/2019   Procedure: POLYPECTOMY;  Surgeon: Thornton Park, MD;  [facility];  Service: Gastroenterology;;   SHOULDER ARTHROSCOPY WITH ROTATOR CUFF REPAIR AND SUBACROMIAL DECOMPRESSION Left 05/07/2019   Procedure: LEFT SHOULDER ARTHROSCOPY WITH DEBRIDEMENT, DISTAL CLAVICLE EXCISION,  SUBACROMIAL DECOMPRESSION AND POSSIBLE ROTATOR CUFF REPAIR;  Surgeon: [name], MD;  Location: [place];  Service: Orthopedics;  Laterality: Left;   TEE WITHOUT CARDIOVERSION N/A 04/06/2012   Procedure: TRANSESOPHAGEAL  ECHOCARDIOGRAM (TEE);  Surgeon: [name], MD;  Location: [facility];  Service: Cardiovascular;  Laterality: N/A;   TRIGGER FINGER RELEASE Right    middle finger   TRIGGER FINGER RELEASE Left 06/28/2016   Procedure: RELEASE TRIGGER FINGER LEFT 3RD FINGER;  Surgeon: [name], MD;  Location: [place];  Service: Orthopedics;  Laterality: Left;   TRIGGER FINGER RELEASE Right 06/12/2016   Procedure: RIGHT INDEX FINGER TRIGGER RELEASE;  Surgeon: [name], MD;  Location: [place];  Service: Orthopedics;  Laterality: Right;   TRIGGER FINGER RELEASE Right 01/19/2018   Procedure: RIGHT RING FINGER TRIGGER FINGER RELEASE;  Surgeon: [name], MD;  Location: [place];  Service: Orthopedics;  Laterality: Right;   TRIGGER FINGER RELEASE Left 05/07/2019   Procedure: RELEASE TRIGGER FINGER LEFT INDEX FINGER;  Surgeon: [name], MD;  Location: [place];  Service: Orthopedics;  Laterality: Left;   UPPER GI ENDOSCOPY N/A 05/08/2020   Procedure: UPPER GI ENDOSCOPY;  Surgeon: Kieth Brightly, Arta Bruce, MD;  [facility];  Service: General;  Laterality: N/A;   Family History  Problem Relation Age of Onset   Pulmonary embolism  Mother    Stroke Father    Alcohol abuse Father    Pneumonia Father    Hypertension Father    Breast cancer Maternal Aunt    Hypertension Other    Diabetes Other        Aunts and cousins   Asthma Sister    Depression Sister    Arthritis Sister    Sickle cell anemia Son    Social History   Socioeconomic History   Marital status: Divorced    Spouse name: Not on file   Number of children: 1   Years of education: 12   Highest education level: 12th grade  Occupational History   Occupation: disabled    Comment: Disabled  Tobacco Use   Smoking status: Every Day    Packs/day: 0.20    Years: 40.00    Pack years: 8.00    Types: Cigarettes    Start date: 01/28/1977   Smokeless tobacco: Never   Tobacco comments:    Smoked 2 ciggs on 07/17/2020 has not smoked since  Vaping Use   Vaping Use: Never used  Substance and Sexual Activity   Alcohol use: No    Alcohol/week: 0.0 standard drinks   Drug use: Not Currently    Types: Marijuana, "Crack" cocaine    Comment: hx marijuana usemany years ago, Crack -none since 11/2015   Sexual activity: Not Currently    Partners: Male    Birth control/protection: Post-menopausal  Other Topics Concern   Not on file  Social History Narrative   Current Social History       Who lives at home: Patient lives alone in one level home; has 4 steps onto porch with a handrail. Has smoke alarms, no throw rugs. Has elevated toilet. Has shower bench in tub with grab rails.    Transportation: Patient has own vehicle and drives herself    Important Relationships "Family and friends"    Pets: None    Likes to eat varied diet. Seafood, fish, roasted [place] breast, vegetables and salads and fruits.   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 I'm a lovable person."                                                                                                   Social Determinants of Radio broadcast assistant Strain: Low Risk    Difficulty of Paying Living Expenses: Not hard at all  Food Insecurity: No Food Insecurity   Worried About Charity fundraiser in the Last Year: Never true   Arboriculturist in the Last Year: Never true  Transportation Needs: No Transportation Needs   Lack of Transportation (Medical): No   Lack of Transportation (Non-Medical): No  Physical Activity: Inactive   Days of Exercise per Week: 0 days   Minutes of Exercise per Session: 0 min  Stress: No Stress Concern Present   Feeling of Stress : Not at all  Social Connections: Moderately Isolated   Frequency of Communication with Friends and Family: More than three times a week   Frequency of Social Gatherings with Friends and Family: More than three times a week   Attends Religious Services: More than 4 times per year   Active Member of [employer] or Organizations: No   Attends Archivist Meetings: Never   Marital Status: Divorced   Outpatient Encounter Medications as of 07/20/2020  Medication Sig   Accu-Chek Softclix Lancets lancets USE TO TEST 4 TIMES A DAY   acetaminophen (TYLENOL) 500 MG tablet Take 1,000 mg by mouth 2 (two) times daily.   allopurinol (ZYLOPRIM) 300 MG tablet Take 300 mg by mouth in the morning.   ammonium lactate (LAC-HYDRIN) 12 % lotion APPLY TWICE A DAY AS NEEDED FOR DRY SKIN (Patient taking differently: Apply 1 application topically in the morning and at bedtime.)   atorvastatin (LIPITOR) 40 MG tablet TAKE 1 TABLET BY MOUTH EVERY DAY (Patient taking differently: Take 40 mg by mouth in the morning.)   b complex vitamins capsule Take 1 capsule by mouth daily.   B-D UF III MINI PEN NEEDLES 31G X 5 MM MISC CHECK SUGARS 4 TIMES A DAY BEFORE MEALS AND AT BEDTIME.   baclofen (LIORESAL) 10  MG tablet Take 1 tablet (10 mg total) by mouth 3 (three) times daily as needed for muscle spasms. (Patient taking differently: Take 10 mg by mouth 3 (three) times daily.)   blood glucose meter kit and supplies KIT Dispense based on patient and insurance preference. Use up to four times daily as directed. (FOR ICD-9 250.00, 250.01).   busPIRone (BUSPAR) 10 MG tablet Take 1 tablet (10 mg total) by mouth 3 (three) times daily.   diclofenac Sodium (VOLTAREN) 1 % GEL APPLY 2 GRAMS TO AFFECTED AREA 4 TIMES A DAY   diltiazem (CARDIZEM CD) 120 MG 24 hr capsule TAKE 1 CAPSULE (120 MG TOTAL) BY MOUTH 2 (TWO) TIMES DAILY.   docusate sodium (COLACE) 100 MG capsule Take 200 mg by mouth 2 (two) times daily.   enoxaparin (LOVENOX) 40 MG/0.4ML injection Inject 0.4 mLs (  40 mg total) into the skin daily.   febuxostat (ULORIC) 40 MG tablet Take 40 mg by mouth daily.   fluticasone (FLONASE) 50 MCG/ACT nasal spray Place 2 sprays into both nostrils 2 (two) times daily.   Fluticasone-Umeclidin-Vilant (TRELEGY ELLIPTA) 100-62.5-25 MCG/INH AEPB Inhale 1 puff into the lungs daily.   gabapentin (NEURONTIN) 300 MG capsule Take 2 capsules (600 mg total) by mouth 3 (three) times daily. TAKE 2 CAPSULES (600 MG TOTAL) BY MOUTH in the morning and at lunch and take 3 capsules at night (Patient taking differently: Take 600 mg by mouth 3 (three) times daily.)   glucose blood (ACCU-CHEK AVIVA PLUS) test strip 1 each by Other route 3 (three) times daily. TEST 3 TIMES DAILY   hydrOXYzine (ATARAX/VISTARIL) 10 MG tablet TAKE 1 TABLET BY MOUTH EVERY DAY   insulin lispro (HUMALOG) 100 UNIT/ML KwikPen Inject 0-20 Units into the skin 3 (three) times daily before meals. CBG < 70: Drink protein shake or broth for hypoglycemia CBG 70 - 120: 0 units  CBG 121 - 150: 3 units  CBG 151 - 200: 4 units  CBG 201 - 250: 7 units  CBG 251 - 300: 11 units  CBG 301 - 350: 15 units  CBG 351 - 400: 20 units  CBG > 400 call MD   ketoconazole (NIZORAL) 2 %  cream APPLY 1 FINGERTIP AMOUNT TO EACH FOOT DAILY.   loratadine (CLARITIN) 10 MG tablet Take 1 tablet (10 mg total) by mouth daily.   losartan (COZAAR) 100 MG tablet Take 1 tablet (100 mg total) by mouth daily.   metolazone (ZAROXOLYN) 2.5 MG tablet TAKE 1 TABLET EVERY OTHER DAY. TAKE 1 TABLET TWICE WEEKLY ON MONDAYS AND THURSDAYS ONLY   montelukast (SINGULAIR) 10 MG tablet TAKE 1 TABLET BY MOUTH EVERYDAY AT BEDTIME (Patient taking differently: Take 10 mg by mouth at bedtime.)   Multiple Vitamins-Minerals (MULTIVITAMIN WITH MINERALS) tablet Take 1 tablet by mouth daily.   NARCAN 4 MG/0.1ML LIQD nasal spray kit Place 1 spray into the nose as needed (accidental overdose).   NON FORMULARY Uses a C-PAP at bedtime   nystatin (MYCOSTATIN) 100000 UNIT/ML suspension TAKE 5 MLS (500,000 UNITS TOTAL) BY MOUTH 4 (FOUR) TIMES DAILY. SWISH AND SWALLOW. (Patient taking differently: Take 5 mLs by mouth 4 (four) times daily as needed (thrush). Swish and swallow.)   Omega-3 Fatty Acids (FISH OIL) 1000 MG CAPS Take 1,000 mg by mouth in the morning.   omeprazole (PRILOSEC) 20 MG capsule TAKE 1 CAPSULE BY MOUTH EVERY DAY   ondansetron (ZOFRAN-ODT) 4 MG disintegrating tablet Take 1 tablet (4 mg total) by mouth every 6 (six) hours as needed for nausea or vomiting.   Semaglutide,0.25 or 0.5MG/DOS, (OZEMPIC, 0.25 OR 0.5 MG/DOSE,) 2 MG/1.5ML SOPN Inject 0.25 mg into the skin once a week. Minus 2 clicks (Patient taking differently: Inject 0.25 mg into the skin every Thursday. Minus 2 clicks)   varenicline (CHANTIX) 1 MG tablet Take 1 tablet (1 mg total) by mouth 2 (two) times daily.   venlafaxine XR (EFFEXOR-XR) 75 MG 24 hr capsule TAKE 1 CAPSULE BY MOUTH DAILY WITH BREAKFAST. (Patient taking differently: Take 75 mg by mouth daily with breakfast.)   albuterol (VENTOLIN HFA) 108 (90 Base) MCG/ACT inhaler Inhale 2 puffs into the lungs every 6 (six) hours as needed for wheezing or shortness of breath.   Facility-Administered  Encounter Medications as of 07/20/2020  Medication   technetium tetrofosmin (TC-MYOVIEW) injection 78.5 millicurie   Activities of Daily Living  In your present state of health, do you have any difficulty performing the following activities: 07/20/2020 05/08/2020  Hearing? N N  Vision? N N  Difficulty concentrating or making decisions? N N  Walking or climbing stairs? Y Y  Dressing or bathing? Y Y  Comment Aide helps -  Doing errands, shopping? Y N  Comment Orders groceries online- son picks up prescriptions -  Preparing Food and eating ? N -  Using the Toilet? N -  In the past six months, have you accidently leaked urine? N -  Do you have problems with loss of bowel control? N -  Managing your Medications? N -  Managing your Finances? N -  Housekeeping or managing your Housekeeping? Y -  Comment Aide helps -  Some recent data might be hidden   Patient Care Team: [name], MD as PCP - General [name], MD as PCP - Cardiology (Cardiology) Rutherford Guys, MD as Consulting Physician (Ophthalmology) Renelda Mom, DMD (Dentistry) [name], MD as Consulting Physician (Cardiology) [name], MD as Attending Physician (Orthopedic Surgery) Gardiner Barefoot, DPM as Consulting Physician (Podiatry) Lovena Neighbours Conception Oms, MD as Consulting Physician (Urology) Jessy Oto, MD as Consulting Physician (Orthopedic Surgery) Star Age, MD as Attending Physician (Neurology) Rosita Fire, MD as Consulting Physician (Nephrology) Valeta Harms Octavio Graves, DO as Consulting Physician (Pulmonary Disease) [name], LCSW as Social Worker (Licensed Clinical Social Worker)    Assessment:   This is a routine wellness examination for [place].  Exercise Activities and Dietary recommendations Current Exercise Habits: The patient does not participate in regular exercise at present, Exercise limited by: orthopedic condition(s);psychological  condition(s);respiratory conditions(s)   Goals       Get back to swimming daily at [facility]  once breathing under control      Weight (lb) < 346 lb (156.9 kg) (pt-stated)      7% weight loss        Fall Risk Fall Risk  07/20/2020 06/28/2020 11/08/2019 09/28/2019 09/16/2019  Falls in the past year? 1 0 0 0 0  Number falls in past yr: 1 0 0 - -  Injury with Fall? 0 0 0 - -  Comment - - - - -  Risk Factor Category  - - - - -  Comment - - - - -  Risk for fall due to : Impaired balance/gait - - - -  Follow up Falls prevention discussed - - - -   Is the patient's home free of loose throw rugs in walkways, pet beds, electrical cords, etc?   yes      Grab bars in the bathroom? yes      Handrails on the stairs?   yes      Adequate lighting?   yes  Patient rating of health (0-10) scale: 10   Depression Screen PHQ 2/9 Scores 07/20/2020 06/28/2020 03/17/2020 01/26/2020  PHQ - 2 Score 0 0 0 0  PHQ- 9 Score 0 0 0 0  Exception Documentation - - - -    Cognitive Function MMSE - Mini Mental State Exam 02/03/2018  Orientation to time 5  Orientation to Place 5  Registration 3  Attention/ Calculation 5  Recall 3  Language- name 2 objects 2  Language- repeat 1  Language- follow 3 step command 3  Language- read & follow direction 1  Write a sentence 1  Copy design 1  Total score 30   6CIT Screen 07/20/2020 02/03/2018  What Year? - 0 points  What month? - 0 points  What time? 0 points 0 points  Count back from 20 0 points 0 points  Months in reverse 0 points 0 points  Repeat phrase 0 points 0 points  Total Score - 0   Immunization History  Administered Date(s) Administered   Influenza Split 04/05/2012   Influenza Whole 02/24/2007, 10/23/2007, 12/06/2008, 01/16/2010   Influenza,inj,Quad PF,6+ Mos 11/29/2013, 09/29/2014, 11/23/2015, 11/22/2016, 10/07/2017, 09/18/2018   Influenza-Unspecified 11/11/2019   PFIZER Comirnaty(Gray Top)Covid-19 Tri-Sucrose Vaccine 06/28/2020   PFIZER(Purple  Top)SARS-COV-2 Vaccination 04/20/2019, 05/11/2019, 01/11/2020   Pneumococcal Conjugate-13 12/27/2016   Pneumococcal Polysaccharide-23 11/28/2001   Td 05/28/2004   Tdap 06/28/2020   Screening Tests Health Maintenance  Topic Date Due   Pneumococcal Vaccine 79-19 Years old (1 - PCV) Never done   Zoster Vaccines- Shingrix (1 of 2) Never done   INFLUENZA VACCINE  08/28/2020   HEMOGLOBIN A1C  09/14/2020   COVID-19 Vaccine (5 - Booster for Pfizer series) 10/28/2020   MAMMOGRAM  12/10/2020   OPHTHALMOLOGY EXAM  03/17/2021   FOOT EXAM  05/19/2021   PAP SMEAR-Modifier  11/30/2021   COLONOSCOPY (Pts 45-38yr Insurance coverage will need to be confirmed)  03/08/2029   TETANUS/TDAP  06/29/2030   PNEUMOCOCCAL POLYSACCHARIDE VACCINE AGE 69-64 HIGH RISK  Completed   Hepatitis C Screening  Completed   HIV Screening  Completed   HPV VACCINES  Aged Out   Cancer Screenings: Lung: Low Dose CT Chest recommended if Age 57-80years, 30 pack-year currently smoking OR have quit w/in 15years. Patient does qualify. Breast:  Up to date on Mammogram? No   Up to date of Bone Density/Dexa? Yes Colorectal: UTD- Due 2031  Additional Screenings: Hepatitis C Screening: Completed  HIV Screening: Completed  Pap Smear: UTD  Plan:  PCP apt due in August- Diabetes. Follow up as needed before then. Schedule your mammogram- handout given.  Keep up the great work with your weight loss!   I have personally reviewed and noted the following in the patient's chart:   Medical and social history Use of alcohol, tobacco or illicit drugs  Current medications and supplements Functional ability and status Nutritional status Physical activity Advanced directives List of other physicians Hospitalizations, surgeries, and ER visits in previous 12 months Vitals Screenings to include cognitive, depression, and falls Referrals and appointments  In addition, I have reviewed and discussed with patient certain preventive  protocols, quality metrics, and best practice recommendations. A written personalized care plan for preventive services as well as general preventive health recommendations were provided to patient.  EDorna Bloom CWales 07/20/2020   I have reviewed this visit and agree with the documentation.   CDorris Singh MD

## 2020-07-20 NOTE — Patient Instructions (Signed)
You spoke to [name], [name] over the phone for your annual wellness visit.  We discussed goals:   Goals       Get back to swimming daily at [facility]  once breathing under control      Weight (lb) < 346 lb (156.9 kg) (pt-stated)      7% weight loss        We also discussed recommended health maintenance. Please call our office and schedule a visit. As discussed, you are due for:  Health Maintenance  Topic Date Due   Pneumococcal Vaccine 32-25 Years old (1 - PCV) Never done   Zoster Vaccines- Shingrix (1 of 2) Never done   INFLUENZA VACCINE  08/28/2020   HEMOGLOBIN A1C  09/14/2020   COVID-19 Vaccine (5 - Booster for Pfizer series) 10/28/2020   MAMMOGRAM  12/10/2020   OPHTHALMOLOGY EXAM  03/17/2021   FOOT EXAM  05/19/2021   PAP SMEAR-Modifier  11/30/2021   COLONOSCOPY (Pts 45-48yrs Insurance coverage will need to be confirmed)  03/08/2029   TETANUS/TDAP  06/29/2030   PNEUMOCOCCAL POLYSACCHARIDE VACCINE AGE 37-64 HIGH RISK  Completed   Hepatitis C Screening  Completed   HIV Screening  Completed   HPV VACCINES  Aged Out     We also discussed smoking cessation, continue nicotine patches.  1-800-QUIT-NOW  Our clinic's number is [phone]. Please call with questions or concerns about what we discussed today.

## 2020-07-21 ENCOUNTER — Ambulatory Visit: Payer: 59 | Admitting: Podiatry

## 2020-07-25 ENCOUNTER — Telehealth: Payer: Self-pay | Admitting: Pharmacist

## 2020-07-25 DIAGNOSIS — Z720 Tobacco use: Secondary | ICD-10-CM

## 2020-07-25 NOTE — Assessment & Plan Note (Signed)
Patient contacted for follow/up of tobacco intake reduction / tobacco cessation attempt. Patient was congratulated on her birthday.   Since last contact patient reports  quitting for 8 days (Quit 07/17/2020)  Medications currently being used; varenicline

## 2020-07-25 NOTE — Telephone Encounter (Signed)
Patient contacted for follow/up of tobacco intake reduction / tobacco cessation attempt. Patient was congratulated on her birthday.   Since last contact patient reports  quitting for 8 days (Quit 07/17/2020)  Medications currently being used; varenicline  Most challenging triggers to use tobacco remains; Stress  Motivation to quit: health and breathing  Total time with patient call and documentation of interaction: 12 minutes  F/U Phone call planned: 3-4 weeks

## 2020-07-26 ENCOUNTER — Ambulatory Visit: Payer: 59 | Admitting: Podiatry

## 2020-07-28 ENCOUNTER — Encounter: Payer: Self-pay | Admitting: Specialist

## 2020-07-28 ENCOUNTER — Ambulatory Visit (INDEPENDENT_AMBULATORY_CARE_PROVIDER_SITE_OTHER): Payer: 59 | Admitting: Specialist

## 2020-07-28 ENCOUNTER — Other Ambulatory Visit: Payer: Self-pay

## 2020-07-28 VITALS — BP 120/67 | HR 84 | Ht 69.0 in | Wt 324.0 lb

## 2020-07-28 DIAGNOSIS — M4805 Spinal stenosis, thoracolumbar region: Secondary | ICD-10-CM

## 2020-07-28 DIAGNOSIS — Z6841 Body Mass Index (BMI) 40.0 and over, adult: Secondary | ICD-10-CM

## 2020-07-28 DIAGNOSIS — Z981 Arthrodesis status: Secondary | ICD-10-CM

## 2020-07-28 DIAGNOSIS — M4807 Spinal stenosis, lumbosacral region: Secondary | ICD-10-CM

## 2020-07-28 DIAGNOSIS — M48062 Spinal stenosis, lumbar region with neurogenic claudication: Secondary | ICD-10-CM

## 2020-07-28 DIAGNOSIS — M4815 Ankylosing hyperostosis [Forestier], thoracolumbar region: Secondary | ICD-10-CM

## 2020-07-28 NOTE — Progress Notes (Signed)
Office Visit Note   Patient: [name]           Date of Birth: Sep 01, 1965           MRN: [id number] Visit Date: 07/28/2020              Requested by: [name], MD [address],  [place] [address] PCP: [name], MD   Assessment & Plan: Visit Diagnoses:  1. Spinal stenosis of lumbosacral region   2. S/P lumbar fusion   3. Ankylosing hyperostosis ([place]), thoracolumbar region   4. Body mass index 50.0-59.9, adult ([place])   5. Spinal stenosis of lumbar region with neurogenic claudication   6. Spinal stenosis of thoracolumbar region     Plan: Avoid bending, stooping and avoid lifting weights greater than 10 lbs. Avoid prolong standing and walking. Order for a new walker with wheels. Surgery scheduling secretary Kandice Hams, will call you in the next week to schedule for surgery.  Surgery recommended is a three level lumbar thoracolumbar laminectomy T11-12, T12-L1 and L1-L2 for decompression of the spinal cord and nerve roots, this would be done with the operating room sterile microscope.   Risk of surgery includes risk of infection 1 in 100 patients, bleeding 1/2% chance you would need a transfusion.   Risk to the nerves is one in 10,000. You will need to use a corset for a short period 2-4 weeks post op.  Expect improved walking and standing tolerance. Expect relief of leg pain but numbness may persist depending on the length and degree of pressure that has been present.  Follow-Up Instructions: Return in about 6 weeks (around 09/08/2020).   Orders:  No orders of the defined types were placed in this encounter.  No orders of the defined types were placed in this encounter.     Procedures: No procedures performed   Clinical Data: Findings:  Narrative & Impression CLINICAL DATA:  Encounter for other specified surgical aftercare. Spinal stenosis of lumbosacral region. Spinal fusion, lumbosacral, follow-up; spinal stenosis, lumbosacral;  low back pain, greater than 6 weeks; low back pain, progressive neurologic deficit; increasing back pain and radiation into buttocks. Additional history provided by scanning technologist: Patient reports low back pain radiating into right leg. Prior fusion 2008. Lumbar injection March 2022.   EXAM: MRI LUMBAR SPINE WITHOUT AND WITH CONTRAST   TECHNIQUE: Multiplanar and multiecho pulse sequences of the lumbar spine were obtained without and with intravenous contrast.   CONTRAST:  40mL MULTIHANCE GADOBENATE DIMEGLUMINE 529 MG/ML IV SOLN   COMPARISON:  Prior lumbar spine radiographs 06/30/2020 (images available, report unavailable). Lumbar CT myelogram 09/01/2018. Lumbar spine MRI 10/26/2016.   FINDINGS: Segmentation: Redemonstrated transitional lumbosacral anatomy. The L5 vertebra is largely sacralized.   Alignment: Lumbar levocurvature. Trace T12-L1 grade 1 retrolisthesis, unchanged.   Vertebrae: Vertebral body height is maintained. Susceptibility artifact arising from a posterior spinal fusion construct at the L2-L5 levels. Solid arthrodesis at the L2-L5 levels (better appreciated on the prior lumbar CT myelogram of 09/01/2018). No appreciable significant marrow edema or focal suspicious osseous lesion. Multilevel ventrolateral osteophytes.   Conus medullaris and cauda equina: Conus extends to the L1-L2 level. Signal abnormality and enhancement of the spinal cord at the T12-L1 level, as described below.   Paraspinal and other soft tissues: No abnormality identified within included portions of the abdomen/retroperitoneum. Atrophy of the lumbar paraspinal musculature. Postsurgical changes to the dorsal lumbar soft tissues.   Disc levels:   Unless otherwise stated, the level  by level findings below abdomen significantly changed as compared to the prior lumbar CT myelogram of 09/01/2018.   Moderate to moderately advanced disc degeneration at T10-T11, T11-T12 and T12-L1.  Moderate L1-L2 disc degeneration.   Congenitally narrow lumbar spinal canal.   T10-T11: Imaged sagittally. Disc bulge with endplate spurring. Facet arthrosis. Mild spinal canal stenosis with contact upon the ventral spinal cord. Mild left neural foraminal narrowing.   T11-T12: Disc bulge with endplate spurring. Moderate facet arthrosis with minimal ligamentum flavum hypertrophy. Moderate spinal canal stenosis with contact upon the ventral spinal cord. Bilateral neural foraminal narrowing (mild right, moderate left).   T12-L1: Trace retrolisthesis. Progressive disc bulge with endplate spurring. Progressive advanced facet arthrosis and ligamentum flavum hypertrophy. Progressive severe spinal canal stenosis with spinal cord flattening. T2 hyperintense signal abnormality and enhancement of the spinal cord at this level, likely reflecting edema. Progressive bilateral neural foraminal narrowing (moderate/severe right, moderate left).   L1-L2: Disc bulge with endplate spurring. Advanced facet arthrosis with ligamentum flavum hypertrophy. Moderate/severe central canal stenosis with moderate bilateral subarticular narrowing. Moderate bilateral neural foraminal narrowing.   L2-L3: Prior posterior decompression and fusion. Endplate spurring. Posterior element hypertrophy. No significant residual/recurrent spinal canal stenosis. Unchanged mild right neural foraminal narrowing.   L3-L4: Prior posterior decompression and fusion. Vertebral osteophytosis and endplate spurring, more prominent in the left subarticular zone. Posterior element hypertrophy. As before, there is mild left subarticular narrowing without nerve root impingement. Central canal patent. Unchanged mild bilateral bony neural foraminal narrowing.   L4-L5: Prior posterior decompression and fusion. Vertebral osteophytosis and endplate spurring. Posterior element hypertrophy. No residual/recurrent spinal canal stenosis. Mild  bilateral bony neural foraminal narrowing.   L5-S1: Imaged sagittally. Incompletely imaged in the axial plane. Rudimentary disc space. No canal or foraminal stenosis.   Impression #2 will be called to the ordering clinician or representative by the Radiologist Assistant, and communication documented in the PACS or [employer].   IMPRESSION: Redemonstrated sequela of prior L2-L5 posterior decompression and fusion. Solid arthrodesis at these levels better appreciated on the prior lumbar CT myelogram of 09/01/2018. No more than mild residual spinal canal stenosis or neural foraminal narrowing at these levels, unchanged.   At T12-L1, there is progressive multifactorial severe spinal canal stenosis with spinal cord flattening. T2 hyperintense signal abnormality and enhancement of the spinal cord at this level, likely reflecting edema. Progressive bilateral neural foraminal narrowing (moderate/severe right, moderate left).   At L1-L2, there is unchanged multifactorial moderate/severe spinal canal stenosis. Unchanged moderate bilateral neural foraminal narrowing.   At T11-T12, there is unchanged multifactorial moderate spinal canal stenosis. Unchanged bilateral neural foraminal narrowing (mild right, moderate left).   No more than mild relative spinal canal narrowing at the remaining levels. Additional sites of mild and moderate foraminal stenosis, as described.     Electronically Signed   By: Kellie Simmering DO   On: 07/19/2020 12:23       Subjective: Chief Complaint  Patient presents with   Lower Back - Pain, Follow-up    MRI Review    55 year old female with history of lumbar fusion surgery L2 to S1. Increasing back and leg pain with standing and walking. She uses a walker with seat. Pain stops her from walking and she can not walk more than 10-15 feet without a walker. No bowel or bladder disfunction. She report blood pressure is controlled. Cardiologist apparently  okay with considering surgery Dr. Harrell Gave. She feels better with sitting and stooping and with using the walker  stooped over. She has Lost 86 lbs to try to decrease stresses on the spine and improve our chances of lessening the risks of intervention. Underwent recent MRI showing severe spine stenosis  At the thoracolumbar junction above her fusion. T11-12, T12-L1 and L1-2.   Review of Systems  Constitutional: Negative.   HENT: Negative.    Eyes: Negative.   Respiratory: Negative.    Cardiovascular: Negative.   Gastrointestinal: Negative.   Endocrine: Negative.   Genitourinary: Negative.   Musculoskeletal: Negative.   Skin: Negative.   Allergic/Immunologic: Negative.   Neurological: Negative.   Hematological: Negative.   Psychiatric/Behavioral: Negative.      Objective: Vital Signs: BP 120/67   Pulse 84   Ht 5\' 9"  (1.753 m)   Wt (!) 324 lb (147 kg)   LMP 04/10/2006   BMI 47.85 kg/m   Physical Exam Constitutional:      Appearance: She is well-developed.  HENT:     Head: Normocephalic and atraumatic.  Eyes:     Pupils: Pupils are equal, round, and reactive to light.  Pulmonary:     Effort: Pulmonary effort is normal.     Breath sounds: Normal breath sounds.  Abdominal:     General: Bowel sounds are normal.     Palpations: Abdomen is soft.  Musculoskeletal:     Cervical back: Normal range of motion and neck supple.     Lumbar back: Negative right straight leg raise test and negative left straight leg raise test.  Skin:    General: Skin is warm and dry.  Neurological:     Mental Status: She is alert and oriented to person, place, and time.  Psychiatric:        Behavior: Behavior normal.        Thought Content: Thought content normal.        Judgment: Judgment normal.    Back Exam   Tenderness  The patient is experiencing tenderness in the lumbar.  Range of Motion  Extension:  abnormal  Flexion:  abnormal  Lateral bend right:  abnormal  Lateral bend  left:  abnormal  Rotation right:  abnormal   Muscle Strength  Right Quadriceps:  5/5  Left Quadriceps:  5/5  Right Hamstrings:  5/5  Left Hamstrings:  5/5   Tests  Straight leg raise right: negative Straight leg raise left: negative  Reflexes  Patellar:  normal Achilles:  normal  Other  Toe walk: normal Heel walk: normal Sensation: normal Gait: normal  Erythema: no back redness Scars: present     Specialty Comments:  No specialty comments available.  Imaging: No results found.   PMFS History: Patient Active Problem List   Diagnosis Date Noted   Cubital tunnel syndrome on left 05/02/2020   Vaginal discharge 01/20/2020   Prolapse of anterior vaginal wall 11/10/2019   Skin ulcer of left thigh, limited to breakdown of skin ([place]) 09/09/2019   Ulnar nerve compression 08/06/2019   Diabetic neuropathy associated with type 2 diabetes mellitus ([place]) 08/06/2019   Body mass index 50.0-59.9, adult ([place]) 06/12/2019   Impingement syndrome of left shoulder 05/07/2019   Polyp of colon    Polyp of ascending colon    Rectal discomfort 02/12/2019   Osteoarthritis of right hip 02/12/2019   Nontraumatic tear of left supraspinatus tendon 12/30/2018   Gout 12/17/2018   Nontraumatic complete tear of right rotator cuff 12/08/2018   Rotator cuff tear, right 12/01/2018   Tobacco abuse 08/21/2018   Right groin pain 07/20/2018  Depressed mood 07/13/2018   Paroxysmal SVT (supraventricular tachycardia) (HCC) 03/20/2018   Pure hypercholesterolemia 03/20/2018   Muscle cramping 02/25/2018   Nodule of upper lobe of right lung 02/13/2018   Falls, subsequent encounter 01/30/2018   Pain in left foot 11/04/2017   Essential hypertension 10/27/2017   Solitary pulmonary nodule 10/07/2017   Restrictive lung disease secondary to obesity 10/01/2017   Polycythemia, secondary 10/01/2017   Chronic viral hepatitis B without delta-agent ([place]) 07/08/2017   COPD (chronic obstructive pulmonary  disease) with chronic bronchitis ([place]) 06/27/2017   Chronic kidney disease (CKD), stage IV (severe) ([place]) 01/14/2017   Type 2 diabetes mellitus with stage 3 chronic kidney disease, with long-term current use of insulin ([place]) 12/12/2016   Urge incontinence of urine 12/05/2016   Trigger finger, left index finger 07/15/2016   Back pain 04/07/2014   Morbid obesity ([place]) 10/02/2012   Chronic diastolic CHF (congestive heart failure) ([place]) 04/07/2012   GERD 01/26/2010   Obstructive sleep apnea treated with BiPAP 02/03/2008   FIBROCYSTIC BREAST DISEASE 10/28/2006   Hyperlipidemia 03/27/2006   Obesity hypoventilation syndrome ([place]) 03/27/2006   Depression with anxiety 03/27/2006   Past Medical History:  Diagnosis Date   Alcoholism ([place])    Anxiety    Arthritis 04-10-11   hips, shoulders, back   Asthma    Bipolar disorder ([place])    Cardiomegaly    Carpal tunnel syndrome, bilateral    Cervical cancer ([place]) 1993   cervical, no treatment done, went away per pt   Cervical dysplasia or atypia 04-10-11   '93- once dx.-got pregnant-no intervention, then postpartum, no dysplasia found   CHF (congestive heart failure) ([place])    no cardiologist 2014 dx, none now   Chronic hypoxemic respiratory failure ([place])    Chronic kidney disease    stage 3 kidney disease   Condyloma - gluteal cleft 04/09/2011   Removed by general surgery. Pathology showed Condyloma, gluteal CONDYLOMA ACUMINATUM.    COPD (chronic obstructive pulmonary disease) ([place])    Depression    Diabetes mellitus without complication ([place])    Dyspnea    with actity, sitting   Fall 12/16/2016   Fibrocystic breast disease    GERD (gastroesophageal reflux disease)    Grade I diastolic dysfunction 62/22/9798   Noted on ECHO   Hepatitis    hep B-count is low at present,doesn't register   History of PSVT (paroxysmal supraventricular tachycardia)    Hypercalcemia    Hyperlipidemia    Hypertension    Incomplete left bundle branch block  (LBBB) 09/24/2018   Noted on EKG   Lung nodule 11/2017   right and left lung   Migraine headache    none recent   Morbid obesity ([place]) 03/27/2006   Neuropathy    Pneumonia    walking pneu 15 yrs. ago   Skin lesion 03/15/2011   In gluteal crease now s/p removal by Dr. [name] of General Surgery on 3/12. Path shows condyloma.      Sleep apnea 04-10-11   uses cpap, pt does not know settings   Trigger finger    left third   Urge incontinence of urine     Family History  Problem Relation Age of Onset   Pulmonary embolism Mother    Stroke Father    Alcohol abuse Father    Pneumonia Father    Hypertension Father    Breast cancer Maternal Aunt    Hypertension Other    Diabetes Other  Aunts and cousins   Asthma Sister    Depression Sister    Arthritis Sister    Sickle cell anemia Son     Past Surgical History:  Procedure Laterality Date   ANAL FISTULECTOMY  04/17/2011   Procedure: FISTULECTOMY ANAL;  Surgeon: Imogene Burn. [name], MD;  Location: [facility];  Service: General;  Laterality: N/A;  Excision of Condyloma Gluteal Cleft    ANAL RECTAL MANOMETRY N/A 03/26/2019   Procedure: ANO RECTAL MANOMETRY;  Surgeon: Thornton Park, MD;  [facility];  Service: Gastroenterology;  Laterality: N/A;   BIOPSY  03/09/2019   Procedure: BIOPSY;  Surgeon: Thornton Park, MD;  [facility];  Service: Gastroenterology;;   BOTOX INJECTION N/A 12/29/2018   Procedure: BOTOX INJECTION(100 UNITS)  WITH CYSTOSCOPY;  Surgeon: Ceasar Mons, MD;  [facility];  Service: Urology;  Laterality: N/A;   BOTOX INJECTION N/A 07/27/2019   Procedure: BOTOX INJECTION WITH CYSTOSCOPY;  Surgeon: Ceasar Mons, MD;  [facility];  Service: Urology;  Laterality: N/A;  ONLY NEEDS 30 MIN   BREAST CYST EXCISION     bilateral breast, 3 cysts removed from each breast   BREAST EXCISIONAL BIOPSY Right    x 3   BREAST EXCISIONAL BIOPSY Left    x 3   CARPAL TUNNEL RELEASE  Bilateral    Cervical biospy     CERVICAL FUSION  04-10-11   x3- cervical fusion with plating and screws-Dr. [name]   COLONOSCOPY WITH PROPOFOL N/A 07/06/2015   Procedure: COLONOSCOPY WITH PROPOFOL;  Surgeon: [name], MD;  [facility];  Service: Endoscopy;  Laterality: N/A;   COLONOSCOPY WITH PROPOFOL N/A 03/09/2019   Procedure: COLONOSCOPY WITH PROPOFOL;  Surgeon: Thornton Park, MD;  [facility];  Service: Gastroenterology;  Laterality: N/A;   DOPPLER ECHOCARDIOGRAPHY  04/06/2012   AT [facility] 55-60%   FOOT SURGERY Left 2018   FRACTURE SURGERY     little toe left foot   I & D EXTREMITY Left 12/18/2016   Procedure: IRRIGATION AND DEBRIDEMENT LEFT FOOT, CLOSURE;  Surgeon: [name], MD;  Location: [facility];  Service: Orthopedics;  Laterality: Left;   LAPAROSCOPIC GASTRIC SLEEVE RESECTION N/A 05/08/2020   Procedure: LAPAROSCOPIC GASTRIC SLEEVE RESECTION;  Surgeon: Kieth Brightly Arta Bruce, MD;  [facility];  Service: General;  Laterality: N/A;   LUMBAR [place] SURGERY  04-10-11   x5-Lumbar fusion-retained hardware.(Dr. Louanne Skye)   MULTIPLE TOOTH EXTRACTIONS     POLYPECTOMY  03/09/2019   Procedure: POLYPECTOMY;  Surgeon: Thornton Park, MD;  [facility];  Service: Gastroenterology;;   SHOULDER ARTHROSCOPY WITH ROTATOR CUFF REPAIR AND SUBACROMIAL DECOMPRESSION Left 05/07/2019   Procedure: LEFT SHOULDER ARTHROSCOPY WITH DEBRIDEMENT, DISTAL CLAVICLE EXCISION,  SUBACROMIAL DECOMPRESSION AND POSSIBLE ROTATOR CUFF REPAIR;  Surgeon: [name], MD;  Location: [facility];  Service: Orthopedics;  Laterality: Left;   TEE WITHOUT CARDIOVERSION N/A 04/06/2012   Procedure: TRANSESOPHAGEAL ECHOCARDIOGRAM (TEE);  Surgeon: [name], MD;  Location[facility];  Service: Cardiovascular;  Laterality: N/A;   TRIGGER FINGER RELEASE Right    middle finger   TRIGGER FINGER RELEASE Left 06/28/2016   Procedure: RELEASE TRIGGER FINGER LEFT 3RD FINGER;  Surgeon: [name], MD;  Location: [facility];  Service: Orthopedics;  Laterality: Left;   TRIGGER FINGER RELEASE Right 06/12/2016   Procedure: RIGHT INDEX FINGER TRIGGER RELEASE;  Surgeon: [name], MD;  Location: [facility];  Service: Orthopedics;  Laterality: Right;   TRIGGER FINGER  RELEASE Right 01/19/2018   Procedure: RIGHT RING FINGER TRIGGER FINGER RELEASE;  Surgeon: [name], MD;  Location: [facility];  Service: Orthopedics;  Laterality: Right;   TRIGGER FINGER RELEASE Left 05/07/2019   Procedure: RELEASE TRIGGER FINGER LEFT INDEX FINGER;  Surgeon: [name], MD;  Location: [facility];  Service: Orthopedics;  Laterality: Left;   UPPER GI ENDOSCOPY N/A 05/08/2020   Procedure: UPPER GI ENDOSCOPY;  Surgeon: Kieth Brightly, Arta Bruce, MD;  [facility];  Service: General;  Laterality: N/A;   Social History   Occupational History   Occupation: disabled    Comment: Disabled  Tobacco Use   Smoking status: Every Day    Packs/day: 0.20    Years: 40.00    Pack years: 8.00    Types: Cigarettes    Start date: 01/28/1977   Smokeless tobacco: Never   Tobacco comments:    Smoked 2 ciggs on 07/17/2020 has not smoked since  Vaping Use   Vaping Use: Never used  Substance and Sexual Activity   Alcohol use: No    Alcohol/week: 0.0 standard drinks   Drug use: Not Currently    Types: Marijuana, "Crack" cocaine    Comment: hx marijuana usemany years ago, Crack -none since 11/2015   Sexual activity: Not Currently    Partners: Male    Birth control/protection: Post-menopausal

## 2020-07-28 NOTE — Patient Instructions (Addendum)
Avoid bending, stooping and avoid lifting weights greater than 10 lbs. Avoid prolong standing and walking. Order for a new walker with wheels. Surgery scheduling secretary Kandice Hams, will call you in the next week to schedule for surgery.  Surgery recommended is a three level lumbar thoracolumbar laminectomy T11-12, T12-L1 and L1-L2 for decompression of the spinal cord and nerve roots, this would be done with the operating room sterile microscope.   Risk of surgery includes risk of infection 1 in 100 patients, bleeding 1/2% chance you would need a transfusion.   Risk to the nerves is one in 10,000. You will need to use a corset for a short period 2-4 weeks post op.  Expect improved walking and standing tolerance. Expect relief of leg pain but numbness may persist depending on the length and degree of pressure that has been present.

## 2020-08-03 ENCOUNTER — Other Ambulatory Visit: Payer: Self-pay | Admitting: Family Medicine

## 2020-08-04 ENCOUNTER — Telehealth: Payer: Self-pay | Admitting: Specialist

## 2020-08-04 NOTE — Telephone Encounter (Signed)
Pt is wanting a call about her back surgery!   CB [phone]

## 2020-08-05 ENCOUNTER — Other Ambulatory Visit: Payer: Self-pay | Admitting: Family Medicine

## 2020-08-05 DIAGNOSIS — N183 Chronic kidney disease, stage 3 unspecified: Secondary | ICD-10-CM

## 2020-08-05 DIAGNOSIS — Z794 Long term (current) use of insulin: Secondary | ICD-10-CM

## 2020-08-07 ENCOUNTER — Encounter: Payer: Self-pay | Admitting: Specialist

## 2020-08-09 NOTE — Telephone Encounter (Signed)
Need surgery sheet to begin scheduling process.  I called patient and advised will follow up to get sheet, get clearances and let her know when able to schedule.

## 2020-08-11 ENCOUNTER — Other Ambulatory Visit: Payer: Self-pay | Admitting: Family Medicine

## 2020-08-11 DIAGNOSIS — E1122 Type 2 diabetes mellitus with diabetic chronic kidney disease: Secondary | ICD-10-CM

## 2020-08-11 DIAGNOSIS — Z794 Long term (current) use of insulin: Secondary | ICD-10-CM

## 2020-08-19 ENCOUNTER — Other Ambulatory Visit: Payer: Self-pay | Admitting: Family Medicine

## 2020-08-19 DIAGNOSIS — J302 Other seasonal allergic rhinitis: Secondary | ICD-10-CM

## 2020-08-21 ENCOUNTER — Other Ambulatory Visit: Payer: Self-pay

## 2020-08-21 ENCOUNTER — Telehealth: Payer: Self-pay | Admitting: Pharmacist

## 2020-08-21 ENCOUNTER — Encounter: Payer: Self-pay | Admitting: Podiatry

## 2020-08-21 ENCOUNTER — Ambulatory Visit (INDEPENDENT_AMBULATORY_CARE_PROVIDER_SITE_OTHER): Payer: 59 | Admitting: Podiatry

## 2020-08-21 DIAGNOSIS — N184 Chronic kidney disease, stage 4 (severe): Secondary | ICD-10-CM | POA: Diagnosis not present

## 2020-08-21 DIAGNOSIS — E1142 Type 2 diabetes mellitus with diabetic polyneuropathy: Secondary | ICD-10-CM | POA: Diagnosis not present

## 2020-08-21 DIAGNOSIS — M79675 Pain in left toe(s): Secondary | ICD-10-CM | POA: Diagnosis not present

## 2020-08-21 DIAGNOSIS — B351 Tinea unguium: Secondary | ICD-10-CM

## 2020-08-21 DIAGNOSIS — M79674 Pain in right toe(s): Secondary | ICD-10-CM

## 2020-08-21 DIAGNOSIS — Z720 Tobacco use: Secondary | ICD-10-CM

## 2020-08-21 NOTE — Telephone Encounter (Signed)
-----   Message from Leavy Cella, [place] sent at 07/25/2020  4:37 PM EDT ----- Regarding: tobacco cessation  - QUTI 07/17/2020

## 2020-08-21 NOTE — Assessment & Plan Note (Signed)
Patient contacted for follow/up of tobacco cessation 07/17/2020  Since last contact patient reports continued abstinence.  She reports abstinence from use of tobacco despite having pain and need for back surgery in the upcoming weeks.   I encouraged continued abstinence, she continue varenicline at this time.    Total time with patient call and documentation of interaction: 11 minutes.  F/U Phone call planned: 3 weeks.

## 2020-08-21 NOTE — Progress Notes (Addendum)
This patient returns to my office for at risk foot care.  This patient requires this care by a professional since this patient will be at risk due to having chronic kidney disease and type 2 diabetes.  This patient is unable to cut nails herself since the patient cannot reach her nails.These nails are painful walking and wearing shoes.  This patient presents for at risk foot care today.  General Appearance  Alert, conversant and in no acute stress.  Vascular  Dorsalis pedis and posterior tibial  pulses are palpable  bilaterally.  Capillary return is within normal limits  bilaterally. Temperature is within normal limits  bilaterally.  Neurologic  Senn-Weinstein monofilament wire test diminished  bilaterally. Muscle power within normal limits bilaterally.  Nails Thick disfigured discolored nails with subungual debris  from hallux to fifth toes bilaterally. No evidence of bacterial infection or drainage bilaterally.  Orthopedic  No limitations of motion  feet .  No crepitus or effusions noted.  No bony pathology or digital deformities noted.  HAV  Left foot.  Skin  normotropic skin with no porokeratosis noted bilaterally.  No signs of infections or ulcers noted.     Onychomycosis  Pain in right toes  Pain in left toes  Consent was obtained for treatment procedures.   Mechanical debridement of nails 1-5  bilaterally performed with a nail nipper.  Filed with dremel without incident.    Return office visit   9 weeks                 Told patient to return for periodic foot care and evaluation due to potential at risk complications.   Gardiner Barefoot DPM

## 2020-08-21 NOTE — Telephone Encounter (Signed)
Patient contacted for follow/up of tobacco cessation 07/17/2020  Since last contact patient reports continued abstinence.  She reports abstinence from use of tobacco despite having pain and need for back surgery in the upcoming weeks.   I encouraged continued abstinence, she continue varenicline at this time.    Total time with patient call and documentation of interaction: 11 minutes.  F/U Phone call planned: 3 weeks.

## 2020-08-22 ENCOUNTER — Ambulatory Visit: Payer: 59 | Admitting: Licensed Clinical Social Worker

## 2020-08-22 ENCOUNTER — Telehealth: Payer: Self-pay

## 2020-08-22 DIAGNOSIS — Z789 Other specified health status: Secondary | ICD-10-CM

## 2020-08-22 NOTE — Patient Instructions (Addendum)
Visit Information It was a pleasure working with you.  As we discussed you will call the office if needed.  Patient verbalizes understanding of instructions provided today and agrees to view in [place].   No further follow up required by [place] [place] [facility]  [phone]

## 2020-08-22 NOTE — Chronic Care Management (AMB) (Signed)
    Clinical Social Work  Care Management   Phone Outreach    5/40/9811 Name: [name] MRN: [id number] DOB: [birth year]  [name] is a 55 y.o. year old female who is a primary care patient of Mary Shave, MD .   F/U phone call today to assess needs, progress and barriers with previous care plan goals.   Assessment: Patient is engaged in conversation, continues to maintain positive progress with care plan goals..   Follow up Plan: Patient does not require or desire continued follow-up. Will contact the office if needed  LCSW will disconnect from care team.   Review of patient status, including review of consultants reports, relevant laboratory and other test results, and collaboration with appropriate care team members and the patient's provider was performed as part of comprehensive patient evaluation and provision of care management services.     [name], [place] [facility] [place]   [phone] 9:51 AM

## 2020-08-22 NOTE — Telephone Encounter (Signed)
   Primary Cardiologist: [name], MD  Chart reviewed as part of pre-operative protocol coverage. Given past medical history and time since last visit, based on ACC/AHA guidelines, [name] would be at acceptable risk for the planned procedure without further cardiovascular testing.   I will route this recommendation to the requesting party via Epic fax function and remove from pre-op pool.  Please call with questions.  Jossie Ng. Mccrae Speciale NP-C    08/22/2020, 4:44 PM [facility] [address] Office [phone] Fax [phone]

## 2020-08-22 NOTE — Telephone Encounter (Signed)
   [facility] Pre-operative Risk Assessment    Patient Name: [name]  DOB: 09/11/1965 MRN: [id number]  HEARTCARE STAFF:  - IMPORTANT!!!!!! Under Visit Info/Reason for Call, type in Other and utilize the format Clearance MM/DD/YY or Clearance TBD. Do not use dashes or single digits. - Please review there is not already an duplicate clearance open for this procedure. - If request is for dental extraction, please clarify the # of teeth to be extracted. - If the patient is currently at the dentist's office, call Pre-Op Callback Staff (MA/nurse) to input urgent request.  - If the patient is not currently in the dentist office, please route to the Pre-Op pool.  Request for surgical clearance:  What type of surgery is being performed? Thoracolumbar Laminectomy  When is this surgery scheduled? TBD  What type of clearance is required (medical clearance vs. Pharmacy clearance to hold med vs. Both)? Medical   Are there any medications that need to be held prior to surgery and how long? None Listed  Practice name and name of physician performing surgery? [name], MD   [facility]   What is the office phone number? [phone] [place])   7.   What is the office fax number? [phone]  8.   Anesthesia type (None, local, MAC, general) ? Claiborne Rigg T 08/22/2020, 4:11 PM  _________________________________________________________________   (provider comments below)

## 2020-08-23 ENCOUNTER — Telehealth: Payer: Self-pay | Admitting: Pulmonary Disease

## 2020-08-23 ENCOUNTER — Encounter: Payer: Self-pay | Admitting: Specialist

## 2020-08-23 NOTE — Telephone Encounter (Signed)
Patient has been advised that the blue sheet has been completed and that we are waiting on the clearances from her Drs.

## 2020-08-23 NOTE — Telephone Encounter (Signed)
Noted and agree. 

## 2020-08-23 NOTE — Telephone Encounter (Signed)
Fax received from Dr. [name] to perform a thoracolumbar laminectomy  with general anesthesia on patient.  Patient needs surgery clearance. Patient was just seen 06/28/20. Office protocol is a risk assessment can be sent to surgeon if patient has been seen in 60 days or less.   Sending to Dr. Valeta Harms for risk assessment or recommendations if patient needs to be seen in office prior to surgical procedure.

## 2020-08-24 NOTE — Telephone Encounter (Signed)
Per Dr. Valeta Harms  PCCM:   Patient is moderate risk for perioperative pulmonary complications.  May benefit from postoperative BiPAP therapy.   Otherwise no other barriers to proceed for procedure.   Garner Nash, DO  [facility]  08/23/2020 4:58 PM   Risk assessment and Last OV sent to surgeon.

## 2020-08-29 ENCOUNTER — Encounter: Payer: Self-pay | Admitting: Specialist

## 2020-08-29 ENCOUNTER — Encounter: Payer: Self-pay | Admitting: Family Medicine

## 2020-08-29 ENCOUNTER — Ambulatory Visit
Admission: RE | Admit: 2020-08-29 | Discharge: 2020-08-29 | Disposition: A | Discharge status: Home / Self Care | Payer: 59 | Source: Ambulatory Visit | Attending: Pulmonary Disease | Admitting: Pulmonary Disease

## 2020-08-29 ENCOUNTER — Other Ambulatory Visit: Payer: Self-pay

## 2020-08-29 ENCOUNTER — Ambulatory Visit (INDEPENDENT_AMBULATORY_CARE_PROVIDER_SITE_OTHER): Payer: 59 | Admitting: Family Medicine

## 2020-08-29 VITALS — BP 140/72 | HR 95 | Ht 69.0 in | Wt 319.0 lb

## 2020-08-29 DIAGNOSIS — N183 Chronic kidney disease, stage 3 unspecified: Secondary | ICD-10-CM | POA: Diagnosis not present

## 2020-08-29 DIAGNOSIS — R911 Solitary pulmonary nodule: Secondary | ICD-10-CM

## 2020-08-29 DIAGNOSIS — E1122 Type 2 diabetes mellitus with diabetic chronic kidney disease: Secondary | ICD-10-CM

## 2020-08-29 DIAGNOSIS — Z794 Long term (current) use of insulin: Secondary | ICD-10-CM | POA: Diagnosis not present

## 2020-08-29 LAB — POCT GLYCOSYLATED HEMOGLOBIN (HGB A1C): HbA1c, POC (controlled diabetic range): 6.8 % (ref 0.0–7.0)

## 2020-08-29 NOTE — Progress Notes (Signed)
    SUBJECTIVE:   CHIEF COMPLAINT / HPI:   Diabetes checkup Patient reports that her diabetes has been doing well.  Her blood sugars have been well controlled and are mainly in the low 100s.  Denies any hypoglycemic episodes.  Is currently on Ozempic as well as 3 units mealtime coverage sliding scale.  No major concerns at this time.  Is excited about her weight loss as well as she has an upcoming surgery.  OBJECTIVE:   BP 140/72   Pulse 95   Ht 5\' 9"  (1.753 m)   Wt (!) 319 lb (144.7 kg)   LMP 04/10/2006   SpO2 94%   BMI 47.11 kg/m   General: Well-appearing pleasant 55 year old female, no acute distress Cardiac: Regular rate and rhythm, no murmurs appreciated Respiratory: Normal work of breathing, lungs clear to auscultation bilaterally Abdomen: Soft, nontender, positive bowel sounds MSK: No gross abnormalities, ambulates with walker  ASSESSMENT/PLAN:   Type 2 diabetes mellitus with stage 3 chronic kidney disease, with long-term current use of insulin ([place]) Patient is doing well on current diabetes regimen.  She is using sliding scale insulin 3 units if her blood sugars are over 120 prior to meals.  Hemoglobin A1c of 6.8 today, greatly improved from previous hemoglobin A1c.  Will continue on current regimen and may be able to wean off of insulin completely depending on her usage.  Follow-up in 3 months.  Form filled out for surgical clearance     [name], MD [place]

## 2020-08-29 NOTE — Patient Instructions (Signed)
It was great seeing you today.  Congratulations on your hemoglobin A1c!  I have filled out the form for you to be surgically cleared and we will fax that to the office on the paperwork.  I want to continue on your current regimen.  If you have any questions or concerns please call the clinic.  Happy of wonderful afternoon!

## 2020-08-30 NOTE — Assessment & Plan Note (Signed)
Patient is doing well on current diabetes regimen.  She is using sliding scale insulin 3 units if her blood sugars are over 120 prior to meals.  Hemoglobin A1c of 6.8 today, greatly improved from previous hemoglobin A1c.  Will continue on current regimen and may be able to wean off of insulin completely depending on her usage.  Follow-up in 3 months.  Form filled out for surgical clearance

## 2020-08-31 NOTE — Telephone Encounter (Signed)
Message routed to [place] to advise if she has received any surgical clearance request for Patient.

## 2020-09-04 ENCOUNTER — Encounter: Payer: Self-pay | Admitting: Specialist

## 2020-09-09 ENCOUNTER — Other Ambulatory Visit: Payer: Self-pay | Admitting: Family Medicine

## 2020-09-09 ENCOUNTER — Other Ambulatory Visit: Payer: Self-pay | Admitting: Cardiology

## 2020-09-09 DIAGNOSIS — N183 Chronic kidney disease, stage 3 unspecified: Secondary | ICD-10-CM

## 2020-09-09 DIAGNOSIS — I471 Supraventricular tachycardia: Secondary | ICD-10-CM

## 2020-09-11 ENCOUNTER — Telehealth: Payer: Self-pay | Admitting: Pulmonary Disease

## 2020-09-11 NOTE — Telephone Encounter (Signed)
Called spoke with patient, she can't walk very well and is having surgery on 10/03/20 and was instructed to stay home until surgery after her covid pre procedure testing.   Dr. Valeta Harms please confirm it is okay to change to a video visit. Patient understands how to log on and do visit from her mychart.

## 2020-09-12 ENCOUNTER — Telehealth: Payer: Self-pay | Admitting: Pharmacist

## 2020-09-12 MED ORDER — VARENICLINE TARTRATE 1 MG PO TABS: 1.0000 mg | ORAL_TABLET | Freq: Two times a day (BID) | ORAL | 3 refills | Status: DC

## 2020-09-12 NOTE — Telephone Encounter (Signed)
Patient contacted for follow/up of tobacco intake reduction / tobacco cessation attempt.   Since last contact patient reports RESTARTING smoking of 1ppd for 1.5 weeks.  She states it was due to stress. She has a back surgery planned for 9/6.  She is having severe pain that has forced her to be home-bound and walking with a stooped posture.    Medications currently being used; Varenicline (ran out of and requested brand name for refill request).  Refill sent with brand requested by patient.   Rates IMPORTANCE of quitting tobacco on 1-10 scale of 10. Rates CONFIDENCE of quitting tobacco on 1-10 scale of 10 If she can volerage varenicline. .  Most common triggers to use tobacco include; severe stress.   Motivation to quit prior to back surgery   Total time with patient call and documentation of interaction: 13 minutes.  F/U Phone call planned: 1 week

## 2020-09-12 NOTE — Telephone Encounter (Signed)
-----   Message from Leavy Cella, [place] sent at 08/21/2020  9:59 AM EDT ----- Regarding: Tobacco abstinence - Maintenance from quit 07/17/2020

## 2020-09-12 NOTE — Telephone Encounter (Signed)
Yes, ok to change to video visit   BLI   Patient aware nothing further needed at this time.

## 2020-09-12 NOTE — Telephone Encounter (Signed)
Noted and agree. 

## 2020-09-15 ENCOUNTER — Encounter: Payer: Self-pay | Admitting: Specialist

## 2020-09-16 ENCOUNTER — Other Ambulatory Visit: Payer: Self-pay | Admitting: Family Medicine

## 2020-09-17 ENCOUNTER — Other Ambulatory Visit: Payer: Self-pay | Admitting: Podiatry

## 2020-09-17 DIAGNOSIS — B353 Tinea pedis: Secondary | ICD-10-CM

## 2020-09-17 DIAGNOSIS — E1151 Type 2 diabetes mellitus with diabetic peripheral angiopathy without gangrene: Secondary | ICD-10-CM

## 2020-09-18 ENCOUNTER — Telehealth: Payer: Self-pay | Admitting: Pharmacist

## 2020-09-18 NOTE — Telephone Encounter (Signed)
Patient contacted for follow/up of tobacco intake reduction / tobacco cessation attempt.   Since last contact patient reports quitting cigarettes again TODAY!   Medications currently being used; Varenicline 1mg  BID with food   Rates CONFIDENCE of quitting tobacco at a high level.   Most common triggers to use tobacco include; stress and anger   Motivation to quit: health and breathing.    Total time with patient call and documentation of interaction: 8 minutes.  F/U Phone call planned: 2 weeks.- prior to back surgery on 10/03/2020

## 2020-09-18 NOTE — Telephone Encounter (Signed)
-----   Message from Leavy Cella, [place] sent at 09/12/2020 11:22 AM EDT ----- Regarding: Quit smoking in past - now 1ppd  restarted varenicline - back surbery 10/03/2020

## 2020-09-20 ENCOUNTER — Ambulatory Visit: Payer: 59 | Admitting: Skilled Nursing Facility1

## 2020-09-20 ENCOUNTER — Encounter: Payer: 59 | Attending: General Surgery | Admitting: Skilled Nursing Facility1

## 2020-09-20 DIAGNOSIS — N184 Chronic kidney disease, stage 4 (severe): Secondary | ICD-10-CM | POA: Diagnosis present

## 2020-09-20 NOTE — Progress Notes (Signed)
Bariatric Nutrition Follow-Up Visit Medical Nutrition Therapy   Surgery Date: 05/08/2020  Pts appt conducted virtually pt agreed to this appt visit type; pt identified by name and DOB  NUTRITION ASSESSMENT    Anthropometrics  Start weight at [facility]: 405.6 lbs (date: 07/08/2018) Today's weight: Pt stated 315 pounds; virtual appt  Body Composition Scale 07/03/2020  Weight  lbs 329.8  Total Body Fat  % 49.6     Visceral Fat 22  Fat-Free Mass  % 50.3     Total Body Water  % 39.6     Muscle-Mass  lbs 35  BMI 48.5  Body Fat Displacement ---        Torso  lbs 101.6        Left Leg  lbs 20.3        Right Leg  lbs 20.3        Left Arm  lbs 10.1        Right Arm  lbs 10.1   Clinical  Medical hx: HTN, Sleep apnea, GERD, DM Medications: Humalog only in the morning Labs: Lipid panel WNL   Lifestyle & Dietary Hx  Pt states she Cant wait for september to have back surgery due to severe pain and will be getting a knee injection. Pt states she was dealing with some depression from not being about to get up and move like she wants. Pt states she is 315 pounds.   Pt states her blood sugars have been excellent: 84-125 stating she only needs to take insulin in the morning now.  Pt state she was wanting to bread of her chicken wings in coconut flakes: dietitian advised it is high in saturated fat to avoid it.  Pt states she has been spending 100 dollars a month on protein 20: Dietitian advised she does not need to continue that behavior anymore.   Estimated daily fluid intake: 64 oz Estimated daily protein intake: 60 g Supplements: calcium and multi Current average weekly physical activity: ADLs due to back pain plans to have a surgery and then will have a hip replacement and then the knee replacement   24-Hr Dietary Recall First Meal: fried egg + cheese + sausage patty Snack: protein water Second Meal: cucumber and lettuce + [place] meatloaf + [place] dressing + garlic powder +  vinegar Snack:  Third Meal: chicken wings or cucumber and lettuce + [place] meatloaf Snack: protein shake to take medicine  Beverages: water, protein water, sugar free twist, almond milk  Post-Op Goals/ Signs/ Symptoms Using straws: no Drinking while eating: no Chewing/swallowing difficulties: no Changes in vision: no Changes to mood/headaches: no Hair loss/changes to skin/nails: no Difficulty focusing/concentrating: no Sweating: no Dizziness/lightheadedness: no Palpitations: no  Carbonated/caffeinated beverages: no N/V/D/C/Gas: no Abdominal pain: no Dumping syndrome: no    NUTRITION DIAGNOSIS  Overweight/obesity ([place]-3.3) related to past poor dietary habits and physical inactivity as evidenced by completed bariatric surgery and following dietary guidelines for continued weight loss and healthy nutrition status.     NUTRITION INTERVENTION Nutrition counseling (C-1) and education (E-2) to facilitate bariatric surgery goals, including: Diet advancement to the next phase (phase 5) now including starchy vegetables  The importance of consuming adequate calories as well as certain nutrients daily due to the body's need for essential vitamins, minerals, and fats The importance of daily physical activity and to reach a goal of at least 150 minutes of moderate to vigorous physical activity weekly (or as directed by their physician) due to benefits such as increased musculature  and improved lab values The importance of intuitive eating specifically learning hunger-satiety cues and understanding the importance of learning a new body: The importance of mindful eating to avoid grazing behaviors   Goals: -add in starchy vegetables throughout the week -limit your dressing to one 1T   Handouts Provided Include  Phase 5  Learning Style & Readiness for Change Teaching method utilized: Visual & Auditory  Demonstrated degree of understanding via: Teach Back  Readiness Level: Action Barriers  to learning/adherence to lifestyle change: back pain  RD's Notes for Next Visit Assess adherence to pt chosen goals   MONITORING & EVALUATION Dietary intake, weekly physical activity, body weight  Next Steps Patient is to follow-up in 3-4 months

## 2020-09-21 ENCOUNTER — Encounter: Payer: Self-pay | Admitting: Surgery

## 2020-09-21 ENCOUNTER — Other Ambulatory Visit: Payer: Self-pay

## 2020-09-21 ENCOUNTER — Ambulatory Visit (INDEPENDENT_AMBULATORY_CARE_PROVIDER_SITE_OTHER): Payer: 59 | Admitting: Surgery

## 2020-09-21 VITALS — BP 143/81 | HR 72

## 2020-09-21 DIAGNOSIS — M4807 Spinal stenosis, lumbosacral region: Secondary | ICD-10-CM

## 2020-09-21 DIAGNOSIS — M4804 Spinal stenosis, thoracic region: Secondary | ICD-10-CM

## 2020-09-21 NOTE — Progress Notes (Signed)
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  States that symptoms unchanged in previous visit.  She is wanting to proceed with laminectomies at the above mentioned levels.  We received preop medical and cardiac clearances.  Today history and physical performed.  Review of systems negative.  Surgical procedure discussed along with potential rehab/recovery time.  All questions answered.

## 2020-09-22 NOTE — Progress Notes (Signed)
Surgical Instructions    Your procedure is scheduled on 10/03/20.  Report to [facility] Main Entrance "A" at 05:30 A.M., then check in with the Admitting office.  Call this number if you have problems the morning of surgery:  [phone]   If you have any questions prior to your surgery date call [phone]: Open Monday-Friday 8am-4pm    Remember:  Do not eat after midnight the night before your surgery  You may drink clear liquids until 04:30am the morning of your surgery.   Clear liquids allowed are: Water, Non-Citrus Juices (without pulp), Carbonated Beverages, Clear Tea, Black Coffee ONLY (NO MILK, CREAM OR POWDERED CREAMER of any kind), and Gatorade    Take these medicines the morning of surgery with A SIP OF WATER  acetaminophen (TYLENOL) albuterol (VENTOLIN HFA) inhaler if needed allopurinol (ZYLOPRIM)  atorvastatin (LIPITOR) baclofen (LIORESAL) diltiazem (CARDIZEM CD) fluticasone (FLONASE) nasal spray Fluticasone-Umeclidin-Vilant (TRELEGY ELLIPTA)  gabapentin (NEURONTIN)  HYDROcodone-acetaminophen (NORCO) hydrOXYzine (ATARAX/VISTARIL)  loratadine (CLARITIN) nystatin (MYCOSTATIN)  omeprazole (PRILOSEC) venlafaxine XR (EFFEXOR-XR)  varenicline (CHANTIX)   As of today, STOP taking any Aspirin (unless otherwise instructed by your surgeon) Aleve, Naproxen, Ibuprofen, Motrin, Advil, Goody's, BC's, all herbal medications, fish oil, diclofenac Sodium (VOLTAREN) gel and all vitamins.  WHAT DO I DO ABOUT MY DIABETES MEDICATION?   Do not take oral diabetes medicines (pills) the morning of surgery.  THE MORNING OF SURGERY, do not take Semaglutide (Ozempic)  The day of surgery, do not take other diabetes injectables, including Byetta (exenatide), Bydureon (exenatide ER), Victoza (liraglutide), or Trulicity (dulaglutide).  If your CBG is greater than 220 mg/dL, you may take  of your sliding scale (HUMALOG KWIKPEN) dose of insulin. Do not take [place] the night before  surgery.    HOW TO MANAGE YOUR DIABETES BEFORE AND AFTER SURGERY  Why is it important to control my blood sugar before and after surgery? Improving blood sugar levels before and after surgery helps healing and can limit problems. A way of improving blood sugar control is eating a healthy diet by:  Eating less sugar and carbohydrates  Increasing activity/exercise  Talking with your doctor about reaching your blood sugar goals High blood sugars (greater than 180 mg/dL) can raise your risk of infections and slow your recovery, so you will need to focus on controlling your diabetes during the weeks before surgery. Make sure that the doctor who takes care of your diabetes knows about your planned surgery including the date and location.  How do I manage my blood sugar before surgery? Check your blood sugar at least 4 times a day, starting 2 days before surgery, to make sure that the level is not too high or low.  Check your blood sugar the morning of your surgery when you wake up and every 2 hours until you get to the Short Stay unit.  If your blood sugar is less than 70 mg/dL, you will need to treat for low blood sugar: Do not take insulin. Treat a low blood sugar (less than 70 mg/dL) with  cup of clear juice (cranberry or apple), 4 glucose tablets, OR glucose gel. Recheck blood sugar in 15 minutes after treatment (to make sure it is greater than 70 mg/dL). If your blood sugar is not greater than 70 mg/dL on recheck, call [phone] for further instructions. Report your blood sugar to the short stay nurse when you get to Short Stay.  If you are admitted to the hospital after surgery: Your blood sugar will be  checked by the staff and you will probably be given insulin after surgery (instead of oral diabetes medicines) to make sure you have good blood sugar levels. The goal for blood sugar control after surgery is 80-180 mg/dL.           Do not wear jewelry or makeup Do not wear lotions,  powders, perfumes/colognes, or deodorant. Do not shave 48 hours prior to surgery.   Do not bring valuables to the hospital.  DO Not wear nail polish, gel polish, artificial nails, or any other type of covering on natural nails  including finger and toenails. If patients have artificial nails, gel coating, etc. that need to be removed by a nail salon please have this removed prior to surgery or surgery may need to be canceled/delayed if the surgeon/ anesthesia feels like the patient is unable to be adequately monitored.             [facility] is not responsible for any belongings or valuables.  Do NOT Smoke (Tobacco/Vaping) or drink Alcohol 24 hours prior to your procedure If you use a CPAP at night, you may bring all equipment for your overnight stay.   Contacts, glasses, dentures or bridgework may not be worn into surgery, please bring cases for these belongings   For patients admitted to the hospital, discharge time will be determined by your treatment team.   Patients discharged the day of surgery will not be allowed to drive home, and someone needs to stay with them for 24 hours.  ONLY 1 SUPPORT PERSON MAY BE PRESENT WHILE YOU ARE IN SURGERY. IF YOU ARE TO BE ADMITTED ONCE YOU ARE IN YOUR ROOM YOU WILL BE ALLOWED TWO (2) VISITORS.  Minor children may have two parents present. Special consideration for safety and communication needs will be reviewed on a case by case basis.  Special instructions:    Oral Hygiene is also important to reduce your risk of infection.  Remember - BRUSH YOUR TEETH THE MORNING OF SURGERY WITH YOUR REGULAR TOOTHPASTE   [place]- Preparing For Surgery  Before surgery, you can play an important role. Because skin is not sterile, your skin needs to be as free of germs as possible. You can reduce the number of germs on your skin by washing with CHG (chlorahexidine gluconate) Soap before surgery.  CHG is an antiseptic cleaner which kills germs and bonds with  the skin to continue killing germs even after washing.     Please do not use if you have an allergy to CHG or antibacterial soaps. If your skin becomes reddened/irritated stop using the CHG.  Do not shave (including legs and underarms) for at least 48 hours prior to first CHG shower. It is OK to shave your face.  Please follow these instructions carefully.     Shower the NIGHT BEFORE SURGERY and the MORNING OF SURGERY with CHG Soap.   If you chose to wash your hair, wash your hair first as usual with your normal shampoo. After you shampoo, rinse your hair and body thoroughly to remove the shampoo.  Then [employer] and genitals (private parts) with your normal soap and rinse thoroughly to remove soap.  After that Use CHG Soap as you would any other liquid soap. You can apply CHG directly to the skin and wash gently with a scrungie or a clean washcloth.   Apply the CHG Soap to your body ONLY FROM THE NECK DOWN.  Do not use on open wounds or  open sores. Avoid contact with your eyes, ears, mouth and genitals (private parts). Wash Face and genitals (private parts)  with your normal soap.   Wash thoroughly, paying special attention to the area where your surgery will be performed.  Thoroughly rinse your body with warm water from the neck down.  DO NOT shower/wash with your normal soap after using and rinsing off the CHG Soap.  Pat yourself dry with a CLEAN TOWEL.  Wear CLEAN PAJAMAS to bed the night before surgery  Place CLEAN SHEETS on your bed the night before your surgery  DO NOT SLEEP WITH PETS.   Day of Surgery: Take a shower with CHG soap. Wear Clean/Comfortable clothing the morning of surgery Do not apply any deodorants/lotions.   Remember to brush your teeth WITH YOUR REGULAR TOOTHPASTE.   Please read over the following fact sheets that you were given.

## 2020-09-25 ENCOUNTER — Other Ambulatory Visit: Payer: Self-pay

## 2020-09-25 ENCOUNTER — Encounter (HOSPITAL_COMMUNITY): Payer: Self-pay

## 2020-09-25 ENCOUNTER — Encounter (HOSPITAL_COMMUNITY)
Admission: RE | Admit: 2020-09-25 | Discharge: 2020-09-25 | Disposition: A | Discharge status: Home / Self Care | Payer: 59 | Source: Ambulatory Visit | Attending: Specialist | Admitting: Specialist

## 2020-09-25 ENCOUNTER — Encounter: Payer: Self-pay | Admitting: Specialist

## 2020-09-25 DIAGNOSIS — I491 Atrial premature depolarization: Secondary | ICD-10-CM | POA: Diagnosis not present

## 2020-09-25 DIAGNOSIS — Z01818 Encounter for other preprocedural examination: Secondary | ICD-10-CM | POA: Diagnosis present

## 2020-09-25 LAB — CBC
HCT: 45.1 % (ref 36.0–46.0)
Hemoglobin: 15.5 g/dL — ABNORMAL HIGH (ref 12.0–15.0)
MCH: 28.3 pg (ref 26.0–34.0)
MCHC: 34.4 g/dL (ref 30.0–36.0)
MCV: 82.3 fL (ref 80.0–100.0)
Platelets: 322 10*3/uL (ref 150–400)
RBC: 5.48 MIL/uL — ABNORMAL HIGH (ref 3.87–5.11)
RDW: 13.8 % (ref 11.5–15.5)
WBC: 9.8 10*3/uL (ref 4.0–10.5)
nRBC: 0 % (ref 0.0–0.2)

## 2020-09-25 LAB — COMPREHENSIVE METABOLIC PANEL
ALT: 30 U/L (ref 0–44)
AST: 20 U/L (ref 15–41)
Albumin: 3.8 g/dL (ref 3.5–5.0)
Alkaline Phosphatase: 81 U/L (ref 38–126)
Anion gap: 8 (ref 5–15)
BUN: 18 mg/dL (ref 6–20)
CO2: 23 mmol/L (ref 22–32)
Calcium: 9.3 mg/dL (ref 8.9–10.3)
Chloride: 107 mmol/L (ref 98–111)
Creatinine, Ser: 1.01 mg/dL — ABNORMAL HIGH (ref 0.44–1.00)
GFR, Estimated: >60 mL/min (ref >60)
Glucose, Bld: 110 mg/dL — ABNORMAL HIGH (ref 70–99)
Potassium: 4.1 mmol/L (ref 3.5–5.1)
Sodium: 138 mmol/L (ref 135–145)
Total Bilirubin: 0.8 mg/dL (ref 0.3–1.2)
Total Protein: 6.8 g/dL (ref 6.5–8.1)

## 2020-09-25 LAB — SURGICAL PCR SCREEN
MRSA, PCR: NEGATIVE
Staphylococcus aureus: NEGATIVE

## 2020-09-25 LAB — GLUCOSE, CAPILLARY: Glucose-Capillary: 113 mg/dL — ABNORMAL HIGH (ref 70–99)

## 2020-09-25 NOTE — Progress Notes (Signed)
PCP - Dr. [name] Cardiologist -  Dr. [name]  PPM/ICD - denies   Chest x-ray - 10/26/19 EKG - 07/15/19 Stress Test - 04/13/19 ECHO - 10/01/17 Cardiac Cath - denies  Sleep Study - 01/29/18 CPAP - yes, every night  DM; Type 2 Fasting Blood Sugar - 125 Checks Blood Sugar 3 times a day  ASA/Blood Thinner Instructions: n/a  ERAS Protcol - yes, no drink   COVID TEST- pt instructed to go to testing center on 9/2   Anesthesia review: yes, cardiac hx  Patient denies shortness of breath, fever, cough and chest pain at PAT appointment   All instructions explained to the patient, with a verbal understanding of the material. Patient agrees to go over the instructions while at home for a better understanding. The opportunity to ask questions was provided.

## 2020-09-26 ENCOUNTER — Encounter: Payer: Self-pay | Admitting: Specialist

## 2020-09-26 NOTE — Progress Notes (Signed)
Anesthesia Chart Review:  Follows with cardiology for history of HFpEF, paroxysmal SVT, HTN, HLD.  Last seen by Dr. Harrell Osborne 07/05/2020.  Per note, her breathing significantly improved and her palpitations resolved following 50 pound weight loss.  Is also noted she has not needed supplemental oxygen for the past year.  Cardiac clearance per telephone encounter 08/22/2020, "Chart reviewed as part of pre-operative protocol coverage. Given past medical history and time since last visit, based on ACC/AHA guidelines, [name] would be at acceptable risk for the planned procedure without further cardiovascular testing."  Follows with pulmonology for history of OSA on BiPAP, moderate COPD, mixed restrictive and obstructive lung disease due to morbid obesity, right upper lobe pulmonary nodule, current smoker.  Dr. Valeta Osborne, turn surgical risk per telephone encounter 08/24/2020 stating, "Patient is moderate risk for perioperative pulmonary complications.  May benefit from postoperative BiPAP therapy. Otherwise no other barriers to proceed for procedure. "  Patient also has medical clearance from PCP Dr. Gifford Osborne dated 08/30/2020.  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  Recently underwent laparoscopic sleeve gastrectomy 9/73/5329 without complication.  Preop labs reviewed, unremarkable.  EKG 09/25/20: Sinus rhythm with Premature atrial complexes. Rate 69. Low voltage QRS. Cannot rule out Anterior infarct , age undetermined  Nuclear stress 04/13/2019: The left ventricular ejection fraction is mildly decreased (45-54%). Nuclear stress EF: 47%. There was no ST segment deviation noted during stress. This is an intermediate risk study due to reduced systolic function. There is no ischemia. New wall motion abnormalities noted. Consider echo if clinically warranted.  Dr. Harrell Osborne commented on result stating, " she has had 2 day nuclear stress test as she cannot achieve >4 METs of activity. I personally  reviewed this study. There is no evidence of ischemia. There is some question of low normal EF and wall motion, but on my review of the study, I cannot exclude this being artifact."  Event monitor 12/02/17: 3 days of rhythm interpreted on Zio patch. Patient had a min HR of 58 bpm, max HR of 193 bpm, and avg HR of 81 bpm. Predominant underlying rhythm was Sinus Rhythm. 217 Supraventricular Tachycardia runs occurred, the run with the fastest interval lasting 6 beats with a max rate of 193 bpm, the longest lasting 1 min 13 secs with an avg rate of 151 bpm. True duration of Supraventricular Tachycardia difficult to ascertain due to Artifact. Thirteen triggered patient events recorded. Supraventricular Tachycardia was detected within +/- 45 seconds of symptomatic patient event(s). Isolated SVEs were occasional (2.0%, 6813), SVE Couplets were rare (<1.0%, 235), and SVE Triplets were rare (<1.0%, 66). No significant ventricular ectopy noted.   TTE 10/01/2017: - Left ventricle: The cavity size was normal. Wall thickness was    normal. Systolic function was normal. The estimated ejection    fraction was in the range of 55% to 60%. Wall motion was normal;    there were no regional wall motion abnormalities. Doppler    parameters are consistent with abnormal left ventricular    relaxation (grade 1 diastolic dysfunction).    [name] [facility] Short Stay Center/Anesthesiology Phone [phone] 09/26/2020 3:50 PM

## 2020-09-26 NOTE — Anesthesia Preprocedure Evaluation (Addendum)
Anesthesia Evaluation  Patient identified by MRN, date of birth, ID band Patient awake    Reviewed: Allergy & Precautions, NPO status , Patient's Chart, lab work & pertinent test results  Airway Mallampati: II  TM Distance: >3 FB Neck ROM: Full    Dental  (+) Dental Advisory Given, Missing, Partial Upper, Edentulous Upper   Pulmonary asthma , sleep apnea and Continuous Positive Airway Pressure Ventilation , COPD, Current Smoker and Patient abstained from smoking.,    Pulmonary exam normal breath sounds clear to auscultation       Cardiovascular hypertension, Pt. on medications +CHF  Normal cardiovascular exam+ dysrhythmias  Rhythm:Regular Rate:Normal     Neuro/Psych  Headaches, PSYCHIATRIC DISORDERS Anxiety Depression Bipolar Disorder  Neuromuscular disease    GI/Hepatic GERD  Medicated and Controlled,(+)     substance abuse  alcohol use, Hepatitis -, B  Endo/Other  diabetes, Type 2, Insulin DependentMorbid obesity  Renal/GU Renal InsufficiencyRenal disease     Musculoskeletal  (+) Arthritis ,   Abdominal   Peds  Hematology negative hematology ROS (+)   Anesthesia Other Findings   Reproductive/Obstetrics                           Anesthesia Physical Anesthesia Plan  ASA: 3  Anesthesia Plan: General   Post-op Pain Management:    Induction: Intravenous  PONV Risk Score and Plan: 3 and Midazolam, Dexamethasone and Ondansetron  Airway Management Planned: Oral ETT  Additional Equipment:   Intra-op Plan:   Post-operative Plan: Extubation in OR  Informed Consent: I have reviewed the patients History and Physical, chart, labs and discussed the procedure including the risks, benefits and alternatives for the proposed anesthesia with the patient or authorized representative who has indicated his/her understanding and acceptance.     Dental advisory given  Plan Discussed with:  CRNA  Anesthesia Plan Comments: (CLEARSIGHT, 2nd PIV.  PAT note by Karoline Caldwell, PA-C: Follows with cardiology for history of HFpEF, paroxysmal SVT, HTN, HLD.  Last seen by Dr. Harrell Gave 07/05/2020.  Per note, her breathing significantly improved and her palpitations resolved following 50 pound weight loss.  Is also noted she has not needed supplemental oxygen for the past year.  Cardiac clearance per telephone encounter 08/22/2020, "Chart reviewed as part of pre-operative protocol coverage. Given past medical history and time since last visit, based on ACC/AHA guidelines,Kalleigh I Headenwould be at acceptable risk for the planned procedure without further cardiovascular testing."  Follows with pulmonology for history of OSA on BiPAP, moderate COPD, mixed restrictive and obstructive lung disease due to morbid obesity, right upper lobe pulmonary nodule, current smoker.  Dr. Valeta Harms, turn surgical risk per telephone encounter 08/24/2020 stating, "Patient is moderate risk for perioperative pulmonary complications. May benefit from postoperative BiPAP therapy. Otherwise no other barriers to proceed for procedure. "  Patient also has medical clearance from PCP Dr. [name] dated 08/30/2020.  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  Recently underwent laparoscopic sleeve gastrectomy 9/93/5701 without complication.  Preop labs reviewed, unremarkable.  EKG 09/25/20: Sinus rhythm with Premature atrial complexes. Rate 69. Low voltage QRS. Cannot rule out Anterior infarct , age undetermined  Nuclear stress 04/13/2019: . The left ventricular ejection fraction is mildly decreased (45-54%). . Nuclear stress EF: 47%. . There was no ST segment deviation noted during stress. . This is an intermediate risk study due to reduced systolic function. There is no ischemia. [name] Kitchen New wall motion abnormalities noted. Consider echo  if clinically warranted.  Dr. Harrell Gave commented on result stating, "she has had 2 day  nuclear stress test as she cannot achieve >4 METs of activity. I personally reviewed this study. There is no evidence of ischemia. There is some question of low normal EF and wall motion, but on my review of the study, I cannot exclude this being artifact."  Event monitor 12/02/17: 3 days of rhythm interpreted on Zio patch. Patient had a min HR of 58 bpm, max HR of 193 bpm, and avg HR of 81 bpm. Predominant underlying rhythm was Sinus Rhythm. 217 Supraventricular Tachycardia runs occurred, the run with the fastest interval lasting 6 beats with a max rate of 193 bpm, the longest lasting 1 min 13 secs with an avg rate of 151 bpm. True duration of Supraventricular Tachycardia difficult to ascertain due to Artifact. Thirteen triggered patient events recorded. Supraventricular Tachycardia was detected within +/- 45 seconds of symptomatic patient event(s). Isolated SVEs were occasional (2.0%, 6813), SVE Couplets were rare (<1.0%, 235), and SVE Triplets were rare (<1.0%, 66). No significant ventricular ectopy noted.  TTE 10/01/2017: - Left ventricle: The cavity size was normal. Wall thickness was  normal. Systolic function was normal. The estimated ejection  fraction was in the range of 55% to 60%. Wall motion was normal;  there were no regional wall motion abnormalities. Doppler  parameters are consistent with abnormal left ventricular  relaxation (grade 1 diastolic dysfunction).   )      Anesthesia Quick Evaluation

## 2020-09-27 ENCOUNTER — Other Ambulatory Visit: Payer: Self-pay

## 2020-09-29 ENCOUNTER — Telehealth (INDEPENDENT_AMBULATORY_CARE_PROVIDER_SITE_OTHER): Payer: 59 | Admitting: Pulmonary Disease

## 2020-09-29 ENCOUNTER — Telehealth: Payer: Self-pay | Admitting: Pharmacist

## 2020-09-29 ENCOUNTER — Other Ambulatory Visit: Payer: Self-pay | Admitting: Specialist

## 2020-09-29 DIAGNOSIS — R911 Solitary pulmonary nodule: Secondary | ICD-10-CM

## 2020-09-29 NOTE — Telephone Encounter (Signed)
Patient contacted for follow/up of tobacco cessation attempt.   Since last contact patient reports high levels of stress including the death of a close cousin from "overdose" this past week. She admits to smoking due to large amounts of stress.    Medications currently being used; Varenicline  Continues to plan on back surgery on in 4 days (9/6) Rates IMPORTANCE of quitting tobacco remains high.  Plans to quit again tomorrow 09/30/2020.   Most common triggers to use tobacco include; Stress    Total time with patient call and documentation of interaction: 12 minutes.  F/U Phone call planned: 2-3 weeks

## 2020-09-29 NOTE — Telephone Encounter (Signed)
-----   Message from Leavy Cella, [place] sent at 09/18/2020 10:40 AM EDT ----- Regarding: Quit smoking 09/18/2020 Back surgery on 9/6

## 2020-09-29 NOTE — Progress Notes (Signed)
PCCM:  Patient was connected for a brief period.  We were unable to communicate.  She logged off.  We attempted to read log back in an order transition to a telemetry visit.  We were unable to do such.  We will try to set up a appointment again in the future.  We called back.  Patient did not answer.  Garner Nash, DO [facility] 09/29/2020 9:40 AM

## 2020-10-03 ENCOUNTER — Inpatient Hospital Stay (HOSPITAL_COMMUNITY): Payer: 59

## 2020-10-03 ENCOUNTER — Other Ambulatory Visit: Payer: Self-pay

## 2020-10-03 ENCOUNTER — Inpatient Hospital Stay (HOSPITAL_COMMUNITY)
Admission: RE | Admit: 2020-10-03 | Discharge: 2020-10-10 | DRG: 519 | Disposition: A | Discharge status: Home Health | Payer: 59 | Attending: Specialist | Admitting: Specialist

## 2020-10-03 ENCOUNTER — Encounter (HOSPITAL_COMMUNITY): Payer: Self-pay | Admitting: Specialist

## 2020-10-03 ENCOUNTER — Encounter (HOSPITAL_COMMUNITY)
Admission: RE | Disposition: A | Discharge status: Home Health | Payer: Self-pay | Source: Home / Self Care | Attending: Specialist

## 2020-10-03 ENCOUNTER — Inpatient Hospital Stay (HOSPITAL_COMMUNITY): Payer: 59 | Admitting: Physician Assistant

## 2020-10-03 ENCOUNTER — Inpatient Hospital Stay (HOSPITAL_COMMUNITY): Payer: 59 | Admitting: Anesthesiology

## 2020-10-03 DIAGNOSIS — F17210 Nicotine dependence, cigarettes, uncomplicated: Secondary | ICD-10-CM | POA: Diagnosis present

## 2020-10-03 DIAGNOSIS — R200 Anesthesia of skin: Secondary | ICD-10-CM | POA: Diagnosis not present

## 2020-10-03 DIAGNOSIS — M5416 Radiculopathy, lumbar region: Secondary | ICD-10-CM | POA: Diagnosis present

## 2020-10-03 DIAGNOSIS — Z8249 Family history of ischemic heart disease and other diseases of the circulatory system: Secondary | ICD-10-CM

## 2020-10-03 DIAGNOSIS — M48061 Spinal stenosis, lumbar region without neurogenic claudication: Secondary | ICD-10-CM | POA: Diagnosis present

## 2020-10-03 DIAGNOSIS — I130 Hypertensive heart and chronic kidney disease with heart failure and stage 1 through stage 4 chronic kidney disease, or unspecified chronic kidney disease: Secondary | ICD-10-CM | POA: Diagnosis present

## 2020-10-03 DIAGNOSIS — Z823 Family history of stroke: Secondary | ICD-10-CM | POA: Diagnosis not present

## 2020-10-03 DIAGNOSIS — Z6841 Body Mass Index (BMI) 40.0 and over, adult: Secondary | ICD-10-CM | POA: Diagnosis not present

## 2020-10-03 DIAGNOSIS — Z833 Family history of diabetes mellitus: Secondary | ICD-10-CM

## 2020-10-03 DIAGNOSIS — K219 Gastro-esophageal reflux disease without esophagitis: Secondary | ICD-10-CM | POA: Diagnosis present

## 2020-10-03 DIAGNOSIS — E785 Hyperlipidemia, unspecified: Secondary | ICD-10-CM | POA: Diagnosis present

## 2020-10-03 DIAGNOSIS — E1122 Type 2 diabetes mellitus with diabetic chronic kidney disease: Secondary | ICD-10-CM | POA: Diagnosis present

## 2020-10-03 DIAGNOSIS — M4805 Spinal stenosis, thoracolumbar region: Principal | ICD-10-CM | POA: Diagnosis present

## 2020-10-03 DIAGNOSIS — Z794 Long term (current) use of insulin: Secondary | ICD-10-CM

## 2020-10-03 DIAGNOSIS — I509 Heart failure, unspecified: Secondary | ICD-10-CM | POA: Diagnosis present

## 2020-10-03 DIAGNOSIS — F319 Bipolar disorder, unspecified: Secondary | ICD-10-CM | POA: Diagnosis present

## 2020-10-03 DIAGNOSIS — M48062 Spinal stenosis, lumbar region with neurogenic claudication: Secondary | ICD-10-CM

## 2020-10-03 DIAGNOSIS — E1142 Type 2 diabetes mellitus with diabetic polyneuropathy: Secondary | ICD-10-CM | POA: Diagnosis present

## 2020-10-03 DIAGNOSIS — Z79899 Other long term (current) drug therapy: Secondary | ICD-10-CM

## 2020-10-03 DIAGNOSIS — M1711 Unilateral primary osteoarthritis, right knee: Secondary | ICD-10-CM | POA: Diagnosis not present

## 2020-10-03 DIAGNOSIS — Z8541 Personal history of malignant neoplasm of cervix uteri: Secondary | ICD-10-CM

## 2020-10-03 DIAGNOSIS — M5414 Radiculopathy, thoracic region: Secondary | ICD-10-CM | POA: Diagnosis not present

## 2020-10-03 DIAGNOSIS — J9611 Chronic respiratory failure with hypoxia: Secondary | ICD-10-CM | POA: Diagnosis present

## 2020-10-03 DIAGNOSIS — J449 Chronic obstructive pulmonary disease, unspecified: Secondary | ICD-10-CM | POA: Diagnosis present

## 2020-10-03 DIAGNOSIS — Z803 Family history of malignant neoplasm of breast: Secondary | ICD-10-CM | POA: Diagnosis not present

## 2020-10-03 DIAGNOSIS — N183 Chronic kidney disease, stage 3 unspecified: Secondary | ICD-10-CM | POA: Diagnosis present

## 2020-10-03 DIAGNOSIS — M4804 Spinal stenosis, thoracic region: Secondary | ICD-10-CM | POA: Diagnosis not present

## 2020-10-03 DIAGNOSIS — Z7951 Long term (current) use of inhaled steroids: Secondary | ICD-10-CM

## 2020-10-03 DIAGNOSIS — Z20822 Contact with and (suspected) exposure to covid-19: Secondary | ICD-10-CM | POA: Diagnosis present

## 2020-10-03 DIAGNOSIS — Z9889 Other specified postprocedural states: Secondary | ICD-10-CM

## 2020-10-03 DIAGNOSIS — Z419 Encounter for procedure for purposes other than remedying health state, unspecified: Secondary | ICD-10-CM

## 2020-10-03 HISTORY — PX: LUMBAR LAMINECTOMY: SHX95

## 2020-10-03 HISTORY — PX: INJECTION KNEE: SHX2446

## 2020-10-03 LAB — GLUCOSE, CAPILLARY
Glucose-Capillary: 144 mg/dL — ABNORMAL HIGH (ref 70–99)
Glucose-Capillary: 241 mg/dL — ABNORMAL HIGH (ref 70–99)
Glucose-Capillary: 176 mg/dL — ABNORMAL HIGH (ref 70–99)
Glucose-Capillary: 117 mg/dL — ABNORMAL HIGH (ref 70–99)

## 2020-10-03 LAB — SARS CORONAVIRUS 2 (TAT 6-24 HRS): SARS Coronavirus 2: NEGATIVE

## 2020-10-03 LAB — HEMOGLOBIN A1C
Hgb A1c MFr Bld: 6.3 % — ABNORMAL HIGH (ref 4.8–5.6)
Mean Plasma Glucose: 134.11 mg/dL

## 2020-10-03 SURGERY — MICRODISCECTOMY LUMBAR LAMINECTOMY
Anesthesia: General | Site: Knee | Laterality: Right

## 2020-10-03 MED ORDER — FLEET ENEMA 7-19 GM/118ML RE ENEM
1.0000 | ENEMA | Freq: Once | RECTAL | Status: DC | PRN
Start: 2020-10-03 — End: 2020-10-10

## 2020-10-03 MED ORDER — SODIUM CHLORIDE 0.9% FLUSH
3.0000 mL | Freq: Two times a day (BID) | INTRAVENOUS | Status: DC
  Administered 2020-10-04 – 2020-10-05 (×2): 3 mL via INTRAVENOUS

## 2020-10-03 MED ORDER — PROPOFOL 10 MG/ML IV BOLUS
INTRAVENOUS | Status: AC
  Filled 2020-10-03: qty 20

## 2020-10-03 MED ORDER — GABAPENTIN 300 MG PO CAPS: 600.0000 mg | ORAL_CAPSULE | Freq: Three times a day (TID) | ORAL | Status: DC

## 2020-10-03 MED ORDER — BISACODYL 5 MG PO TBEC: 5.0000 mg | DELAYED_RELEASE_TABLET | Freq: Every day | ORAL | Status: DC | PRN

## 2020-10-03 MED ORDER — METHYLPREDNISOLONE ACETATE 80 MG/ML IJ SUSP
INTRAMUSCULAR | Status: AC
  Filled 2020-10-03: qty 1

## 2020-10-03 MED ORDER — SUCCINYLCHOLINE CHLORIDE 200 MG/10ML IV SOSY
PREFILLED_SYRINGE | INTRAVENOUS | Status: AC
  Filled 2020-10-03: qty 10

## 2020-10-03 MED ORDER — BUPIVACAINE LIPOSOME 1.3 % IJ SUSP
INTRAMUSCULAR | Status: AC
  Filled 2020-10-03: qty 20

## 2020-10-03 MED ORDER — ALLOPURINOL 300 MG PO TABS
300.0000 mg | ORAL_TABLET | Freq: Every day | ORAL | Status: DC
  Administered 2020-10-04 – 2020-10-10 (×7): 300 mg via ORAL
  Filled 2020-10-03 (×7): qty 1

## 2020-10-03 MED ORDER — NALOXONE HCL 4 MG/0.1ML NA LIQD: 1.0000 | NASAL | Status: DC | PRN

## 2020-10-03 MED ORDER — TRANEXAMIC ACID-NACL 1000-0.7 MG/100ML-% IV SOLN
1000.0000 mg | INTRAVENOUS | Status: AC
  Administered 2020-10-03: 1000 mg via INTRAVENOUS
  Filled 2020-10-03: qty 100

## 2020-10-03 MED ORDER — ONDANSETRON HCL 4 MG/2ML IJ SOLN
4.0000 mg | Freq: Four times a day (QID) | INTRAMUSCULAR | Status: DC | PRN
Start: 2020-10-03 — End: 2020-10-10

## 2020-10-03 MED ORDER — FENTANYL CITRATE (PF) 100 MCG/2ML IJ SOLN
INTRAMUSCULAR | Status: AC
  Filled 2020-10-03: qty 2

## 2020-10-03 MED ORDER — FENTANYL CITRATE (PF) 250 MCG/5ML IJ SOLN
INTRAMUSCULAR | Status: DC | PRN
  Administered 2020-10-03 (×2): 50 ug via INTRAVENOUS
  Administered 2020-10-03: 150 ug via INTRAVENOUS

## 2020-10-03 MED ORDER — GABAPENTIN 300 MG PO CAPS
600.0000 mg | ORAL_CAPSULE | Freq: Three times a day (TID) | ORAL | Status: DC
  Administered 2020-10-03 – 2020-10-10 (×21): 600 mg via ORAL
  Filled 2020-10-03 (×21): qty 2

## 2020-10-03 MED ORDER — ONDANSETRON HCL 4 MG/2ML IJ SOLN
INTRAMUSCULAR | Status: DC | PRN
Start: 2020-10-03 — End: 2020-10-03
  Administered 2020-10-03: 4 mg via INTRAVENOUS

## 2020-10-03 MED ORDER — CEFAZOLIN SODIUM 1 G IJ SOLR
INTRAMUSCULAR | Status: AC
  Filled 2020-10-03: qty 10

## 2020-10-03 MED ORDER — HYDROMORPHONE HCL 1 MG/ML IJ SOLN
0.5000 mg | INTRAMUSCULAR | Status: AC | PRN
  Administered 2020-10-03 (×2): 0.5 mg via INTRAVENOUS

## 2020-10-03 MED ORDER — METHYLPREDNISOLONE SODIUM SUCC 40 MG IJ SOLR
10.0000 mg | Freq: Once | INTRAMUSCULAR | Status: AC
  Administered 2020-10-03: 10 mg via INTRAVENOUS
  Filled 2020-10-03 (×2): qty 0.25

## 2020-10-03 MED ORDER — 0.9 % SODIUM CHLORIDE (POUR BTL) OPTIME
TOPICAL | Status: DC | PRN
  Administered 2020-10-03: 1000 mL

## 2020-10-03 MED ORDER — SODIUM CHLORIDE 0.9 % IV SOLN: 250.0000 mL | INTRAVENOUS | Status: DC

## 2020-10-03 MED ORDER — METHYLPREDNISOLONE ACETATE 80 MG/ML IJ SUSP
INTRAMUSCULAR | Status: DC | PRN
  Administered 2020-10-03: 40 mg

## 2020-10-03 MED ORDER — BUPIVACAINE HCL (PF) 0.5 % IJ SOLN
INTRAMUSCULAR | Status: AC
  Filled 2020-10-03: qty 30

## 2020-10-03 MED ORDER — LIDOCAINE 2% (20 MG/ML) 5 ML SYRINGE
INTRAMUSCULAR | Status: DC | PRN
  Administered 2020-10-03: 100 mg via INTRAVENOUS

## 2020-10-03 MED ORDER — METHYLPREDNISOLONE SODIUM SUCC 125 MG IJ SOLR
INTRAMUSCULAR | Status: AC
  Filled 2020-10-03: qty 2

## 2020-10-03 MED ORDER — ACETAMINOPHEN 10 MG/ML IV SOLN
INTRAVENOUS | Status: DC | PRN
  Administered 2020-10-03: 1000 mg via INTRAVENOUS

## 2020-10-03 MED ORDER — ROCURONIUM BROMIDE 10 MG/ML (PF) SYRINGE
PREFILLED_SYRINGE | INTRAVENOUS | Status: AC
  Filled 2020-10-03: qty 10

## 2020-10-03 MED ORDER — ACETAMINOPHEN 10 MG/ML IV SOLN
INTRAVENOUS | Status: AC
  Filled 2020-10-03: qty 100

## 2020-10-03 MED ORDER — HYDROCODONE-ACETAMINOPHEN 7.5-325 MG PO TABS
1.0000 | ORAL_TABLET | ORAL | Status: DC | PRN
Start: 2020-10-03 — End: 2020-10-03

## 2020-10-03 MED ORDER — ROCURONIUM BROMIDE 10 MG/ML (PF) SYRINGE
PREFILLED_SYRINGE | INTRAVENOUS | Status: DC | PRN
  Administered 2020-10-03 (×2): 10 mg via INTRAVENOUS
  Administered 2020-10-03: 70 mg via INTRAVENOUS
  Administered 2020-10-03: 10 mg via INTRAVENOUS

## 2020-10-03 MED ORDER — LIDOCAINE 2% (20 MG/ML) 5 ML SYRINGE
INTRAMUSCULAR | Status: AC
  Filled 2020-10-03: qty 5

## 2020-10-03 MED ORDER — LORATADINE 10 MG PO TABS
10.0000 mg | ORAL_TABLET | Freq: Every day | ORAL | Status: DC
  Administered 2020-10-04 – 2020-10-10 (×7): 10 mg via ORAL
  Filled 2020-10-03 (×7): qty 1

## 2020-10-03 MED ORDER — MONTELUKAST SODIUM 10 MG PO TABS
10.0000 mg | ORAL_TABLET | Freq: Every day | ORAL | Status: DC
  Administered 2020-10-03 – 2020-10-09 (×7): 10 mg via ORAL
  Filled 2020-10-03 (×7): qty 1

## 2020-10-03 MED ORDER — BUSPIRONE HCL 10 MG PO TABS
10.0000 mg | ORAL_TABLET | Freq: Three times a day (TID) | ORAL | Status: DC
  Administered 2020-10-03 – 2020-10-10 (×20): 10 mg via ORAL
  Filled 2020-10-03 (×20): qty 1

## 2020-10-03 MED ORDER — FENTANYL CITRATE (PF) 100 MCG/2ML IJ SOLN
25.0000 ug | INTRAMUSCULAR | Status: DC | PRN
  Administered 2020-10-03: 25 ug via INTRAVENOUS
  Administered 2020-10-03: 50 ug via INTRAVENOUS
  Administered 2020-10-03 (×3): 25 ug via INTRAVENOUS

## 2020-10-03 MED ORDER — POLYETHYLENE GLYCOL 3350 17 G PO PACK
17.0000 g | PACK | Freq: Every day | ORAL | Status: DC | PRN
  Administered 2020-10-08: 17 g via ORAL
  Filled 2020-10-03: qty 1

## 2020-10-03 MED ORDER — MENTHOL 3 MG MT LOZG: 1.0000 | LOZENGE | OROMUCOSAL | Status: DC | PRN

## 2020-10-03 MED ORDER — METHOCARBAMOL 1000 MG/10ML IJ SOLN
500.0000 mg | Freq: Four times a day (QID) | INTRAVENOUS | Status: DC | PRN
  Filled 2020-10-03: qty 5

## 2020-10-03 MED ORDER — DOCUSATE SODIUM 100 MG PO CAPS
200.0000 mg | ORAL_CAPSULE | Freq: Two times a day (BID) | ORAL | Status: DC
  Administered 2020-10-04 – 2020-10-10 (×13): 200 mg via ORAL
  Filled 2020-10-03 (×13): qty 2

## 2020-10-03 MED ORDER — HYDROMORPHONE HCL 1 MG/ML IJ SOLN
INTRAMUSCULAR | Status: AC
  Filled 2020-10-03: qty 1

## 2020-10-03 MED ORDER — INSULIN ASPART 100 UNIT/ML IJ SOLN
6.0000 [IU] | Freq: Once | INTRAMUSCULAR | Status: AC
  Administered 2020-10-03: 6 [IU] via SUBCUTANEOUS

## 2020-10-03 MED ORDER — NON FORMULARY: 2.0000 | Freq: Four times a day (QID) | Status: DC | PRN

## 2020-10-03 MED ORDER — ALBUTEROL SULFATE (2.5 MG/3ML) 0.083% IN NEBU: 3.0000 mL | INHALATION_SOLUTION | Freq: Four times a day (QID) | RESPIRATORY_TRACT | Status: DC | PRN

## 2020-10-03 MED ORDER — LACTATED RINGERS IV SOLN: INTRAVENOUS | Status: DC

## 2020-10-03 MED ORDER — SODIUM CHLORIDE 0.9% FLUSH
3.0000 mL | INTRAVENOUS | Status: DC | PRN
Start: 2020-10-03 — End: 2020-10-05

## 2020-10-03 MED ORDER — VARENICLINE TARTRATE 1 MG PO TABS
1.0000 mg | ORAL_TABLET | Freq: Two times a day (BID) | ORAL | Status: DC
  Administered 2020-10-03 – 2020-10-10 (×8): 1 mg via ORAL
  Filled 2020-10-03 (×16): qty 1

## 2020-10-03 MED ORDER — ONDANSETRON HCL 4 MG/2ML IJ SOLN
INTRAMUSCULAR | Status: AC
  Filled 2020-10-03: qty 2

## 2020-10-03 MED ORDER — INSULIN ASPART 100 UNIT/ML IJ SOLN
INTRAMUSCULAR | Status: AC
  Filled 2020-10-03: qty 1

## 2020-10-03 MED ORDER — SUGAMMADEX SODIUM 200 MG/2ML IV SOLN
INTRAVENOUS | Status: DC | PRN
  Administered 2020-10-03: 100 mg via INTRAVENOUS
  Administered 2020-10-03: 200 mg via INTRAVENOUS

## 2020-10-03 MED ORDER — B COMPLEX VITAMINS PO CAPS: 1.0000 | ORAL_CAPSULE | Freq: Every day | ORAL | Status: DC

## 2020-10-03 MED ORDER — CEFAZOLIN SODIUM-DEXTROSE 2-4 GM/100ML-% IV SOLN
2.0000 g | INTRAVENOUS | Status: AC
  Administered 2020-10-03: 3 g via INTRAVENOUS
  Filled 2020-10-03: qty 100

## 2020-10-03 MED ORDER — POTASSIUM CHLORIDE 20 MEQ PO PACK
20.0000 meq | PACK | Freq: Every day | ORAL | Status: DC
  Administered 2020-10-04 – 2020-10-10 (×7): 20 meq via ORAL
  Filled 2020-10-03 (×7): qty 1

## 2020-10-03 MED ORDER — FENTANYL CITRATE (PF) 250 MCG/5ML IJ SOLN
INTRAMUSCULAR | Status: AC
  Filled 2020-10-03: qty 5

## 2020-10-03 MED ORDER — THROMBIN 20000 UNITS EX SOLR
CUTANEOUS | Status: AC
  Filled 2020-10-03: qty 20000

## 2020-10-03 MED ORDER — BUPIVACAINE LIPOSOME 1.3 % IJ SUSP: 10.0000 mL | Freq: Once | INTRAMUSCULAR | Status: DC

## 2020-10-03 MED ORDER — HYDROXYZINE HCL 10 MG PO TABS
10.0000 mg | ORAL_TABLET | Freq: Every day | ORAL | Status: DC
  Administered 2020-10-03 – 2020-10-09 (×7): 10 mg via ORAL
  Filled 2020-10-03 (×8): qty 1

## 2020-10-03 MED ORDER — BACLOFEN 10 MG PO TABS: 10.0000 mg | ORAL_TABLET | Freq: Three times a day (TID) | ORAL | Status: DC | PRN

## 2020-10-03 MED ORDER — PHENYLEPHRINE 40 MCG/ML (10ML) SYRINGE FOR IV PUSH (FOR BLOOD PRESSURE SUPPORT)
PREFILLED_SYRINGE | INTRAVENOUS | Status: AC
  Filled 2020-10-03: qty 10

## 2020-10-03 MED ORDER — FLUTICASONE PROPIONATE 50 MCG/ACT NA SUSP
2.0000 | Freq: Two times a day (BID) | NASAL | Status: DC
  Administered 2020-10-04 – 2020-10-10 (×13): 2 via NASAL
  Filled 2020-10-03: qty 16

## 2020-10-03 MED ORDER — PHENYLEPHRINE HCL-NACL 20-0.9 MG/250ML-% IV SOLN
INTRAVENOUS | Status: DC | PRN
  Administered 2020-10-03: 30 ug/min via INTRAVENOUS

## 2020-10-03 MED ORDER — INSULIN ASPART 100 UNIT/ML IJ SOLN
0.0000 [IU] | Freq: Every day | INTRAMUSCULAR | Status: DC
  Administered 2020-10-08: 2 [IU] via SUBCUTANEOUS

## 2020-10-03 MED ORDER — CHLORHEXIDINE GLUCONATE 0.12 % MT SOLN
15.0000 mL | Freq: Once | OROMUCOSAL | Status: AC
  Administered 2020-10-03: 15 mL via OROMUCOSAL
  Filled 2020-10-03: qty 15

## 2020-10-03 MED ORDER — SODIUM CHLORIDE 0.9 % IV SOLN: INTRAVENOUS | Status: DC

## 2020-10-03 MED ORDER — PANTOPRAZOLE SODIUM 40 MG PO TBEC
80.0000 mg | DELAYED_RELEASE_TABLET | Freq: Every day | ORAL | Status: DC
  Administered 2020-10-04 – 2020-10-10 (×7): 80 mg via ORAL
  Filled 2020-10-03 (×3): qty 2
  Filled 2020-10-03: qty 4
  Filled 2020-10-03 (×3): qty 2

## 2020-10-03 MED ORDER — INSULIN ASPART 100 UNIT/ML IJ SOLN
4.0000 [IU] | Freq: Three times a day (TID) | INTRAMUSCULAR | Status: DC
  Administered 2020-10-03 – 2020-10-08 (×9): 4 [IU] via SUBCUTANEOUS

## 2020-10-03 MED ORDER — PROPOFOL 10 MG/ML IV BOLUS
INTRAVENOUS | Status: DC | PRN
  Administered 2020-10-03: 200 mg via INTRAVENOUS

## 2020-10-03 MED ORDER — OXYCODONE HCL 5 MG PO TABS
5.0000 mg | ORAL_TABLET | ORAL | Status: DC | PRN
  Administered 2020-10-03 – 2020-10-10 (×28): 15 mg via ORAL
  Filled 2020-10-03 (×28): qty 3

## 2020-10-03 MED ORDER — INSULIN ASPART 100 UNIT/ML IJ SOLN
0.0000 [IU] | Freq: Three times a day (TID) | INTRAMUSCULAR | Status: DC
  Administered 2020-10-03: 4 [IU] via SUBCUTANEOUS
  Administered 2020-10-04 – 2020-10-10 (×11): 3 [IU] via SUBCUTANEOUS

## 2020-10-03 MED ORDER — BUPIVACAINE HCL 0.5 % IJ SOLN
INTRAMUSCULAR | Status: DC | PRN
  Administered 2020-10-03: 5 mL
  Administered 2020-10-03: 20 mL

## 2020-10-03 MED ORDER — KETOCONAZOLE 2 % EX CREA
1.0000 "application " | TOPICAL_CREAM | Freq: Two times a day (BID) | CUTANEOUS | Status: DC | PRN
  Filled 2020-10-03: qty 15

## 2020-10-03 MED ORDER — METOLAZONE 5 MG PO TABS
5.0000 mg | ORAL_TABLET | Freq: Once | ORAL | Status: DC
  Filled 2020-10-03: qty 1

## 2020-10-03 MED ORDER — PHENOL 1.4 % MT LIQD: 1.0000 | OROMUCOSAL | Status: DC | PRN

## 2020-10-03 MED ORDER — ONDANSETRON HCL 4 MG/2ML IJ SOLN: 4.0000 mg | Freq: Once | INTRAMUSCULAR | Status: DC | PRN

## 2020-10-03 MED ORDER — PHENYLEPHRINE 40 MCG/ML (10ML) SYRINGE FOR IV PUSH (FOR BLOOD PRESSURE SUPPORT)
PREFILLED_SYRINGE | INTRAVENOUS | Status: DC | PRN
  Administered 2020-10-03: 80 ug via INTRAVENOUS

## 2020-10-03 MED ORDER — CALCIUM CARBONATE 1250 (500 CA) MG PO TABS
1250.0000 mg | ORAL_TABLET | Freq: Three times a day (TID) | ORAL | Status: DC
  Administered 2020-10-03 – 2020-10-10 (×21): 1250 mg via ORAL
  Filled 2020-10-03 (×21): qty 1

## 2020-10-03 MED ORDER — METHOCARBAMOL 500 MG PO TABS
500.0000 mg | ORAL_TABLET | Freq: Four times a day (QID) | ORAL | Status: DC | PRN
  Administered 2020-10-03 – 2020-10-10 (×17): 500 mg via ORAL
  Filled 2020-10-03 (×18): qty 1

## 2020-10-03 MED ORDER — ORAL CARE MOUTH RINSE: 15.0000 mL | Freq: Once | OROMUCOSAL | Status: AC

## 2020-10-03 MED ORDER — HYDROCODONE-ACETAMINOPHEN 10-325 MG PO TABS
2.0000 | ORAL_TABLET | ORAL | Status: DC | PRN
Start: 2020-10-03 — End: 2020-10-03

## 2020-10-03 MED ORDER — MORPHINE SULFATE (PF) 2 MG/ML IV SOLN
1.0000 mg | INTRAVENOUS | Status: DC | PRN
  Administered 2020-10-03 (×2): 1 mg via INTRAVENOUS
  Filled 2020-10-03 (×2): qty 1

## 2020-10-03 MED ORDER — AMMONIUM LACTATE 12 % EX LOTN
1.0000 "application " | TOPICAL_LOTION | CUTANEOUS | Status: DC | PRN
  Filled 2020-10-03: qty 225

## 2020-10-03 MED ORDER — ACETAMINOPHEN 500 MG PO TABS
1000.0000 mg | ORAL_TABLET | Freq: Once | ORAL | Status: DC
  Filled 2020-10-03: qty 2

## 2020-10-03 MED ORDER — MIDAZOLAM HCL 2 MG/2ML IJ SOLN
INTRAMUSCULAR | Status: DC | PRN
  Administered 2020-10-03: 2 mg via INTRAVENOUS

## 2020-10-03 MED ORDER — MIDAZOLAM HCL 2 MG/2ML IJ SOLN
INTRAMUSCULAR | Status: AC
  Filled 2020-10-03: qty 2

## 2020-10-03 MED ORDER — THROMBIN 20000 UNITS EX KIT
PACK | CUTANEOUS | Status: DC | PRN
  Administered 2020-10-03: 20 mL via TOPICAL

## 2020-10-03 MED ORDER — ONDANSETRON HCL 4 MG PO TABS
4.0000 mg | ORAL_TABLET | Freq: Four times a day (QID) | ORAL | Status: DC | PRN
Start: 2020-10-03 — End: 2020-10-10

## 2020-10-03 MED ORDER — HYDROMORPHONE HCL 1 MG/ML IJ SOLN
0.5000 mg | INTRAMUSCULAR | Status: DC | PRN
  Administered 2020-10-03 – 2020-10-10 (×26): 1 mg via INTRAVENOUS
  Filled 2020-10-03 (×27): qty 1

## 2020-10-03 MED ORDER — DEXAMETHASONE SODIUM PHOSPHATE 10 MG/ML IJ SOLN
INTRAMUSCULAR | Status: AC
  Filled 2020-10-03: qty 1

## 2020-10-03 MED ORDER — ATORVASTATIN CALCIUM 40 MG PO TABS
40.0000 mg | ORAL_TABLET | Freq: Every day | ORAL | Status: DC
  Administered 2020-10-04 – 2020-10-10 (×7): 40 mg via ORAL
  Filled 2020-10-03 (×7): qty 1

## 2020-10-03 MED ORDER — FLUTICASONE-UMECLIDIN-VILANT 100-62.5-25 MCG/INH IN AEPB: 1.0000 | INHALATION_SPRAY | Freq: Every day | RESPIRATORY_TRACT | Status: DC

## 2020-10-03 MED ORDER — ALUM & MAG HYDROXIDE-SIMETH 200-200-20 MG/5ML PO SUSP
30.0000 mL | Freq: Four times a day (QID) | ORAL | Status: DC | PRN
Start: 2020-10-03 — End: 2020-10-10

## 2020-10-03 MED ORDER — HYDROCODONE-ACETAMINOPHEN 7.5-325 MG PO TABS
1.0000 | ORAL_TABLET | Freq: Four times a day (QID) | ORAL | Status: DC
Start: 2020-10-03 — End: 2020-10-03
  Administered 2020-10-03: 1 via ORAL
  Filled 2020-10-03: qty 1

## 2020-10-03 MED ORDER — VENLAFAXINE HCL ER 75 MG PO CP24
75.0000 mg | ORAL_CAPSULE | Freq: Every day | ORAL | Status: DC
  Administered 2020-10-04 – 2020-10-10 (×7): 75 mg via ORAL
  Filled 2020-10-03 (×7): qty 1

## 2020-10-03 MED ORDER — DILTIAZEM HCL ER COATED BEADS 120 MG PO CP24
120.0000 mg | ORAL_CAPSULE | Freq: Two times a day (BID) | ORAL | Status: DC
Start: 2020-10-03 — End: 2020-10-10
  Administered 2020-10-03 – 2020-10-10 (×14): 120 mg via ORAL
  Filled 2020-10-03 (×14): qty 1

## 2020-10-03 MED ORDER — KETOROLAC TROMETHAMINE 15 MG/ML IJ SOLN
15.0000 mg | Freq: Once | INTRAMUSCULAR | Status: AC
  Administered 2020-10-03: 15 mg via INTRAVENOUS
  Filled 2020-10-03: qty 1

## 2020-10-03 MED ORDER — LOSARTAN POTASSIUM 50 MG PO TABS
100.0000 mg | ORAL_TABLET | Freq: Every day | ORAL | Status: DC
  Administered 2020-10-04 – 2020-10-10 (×7): 100 mg via ORAL
  Filled 2020-10-03 (×7): qty 2

## 2020-10-03 MED ORDER — ADULT MULTIVITAMIN W/MINERALS CH
1.0000 | ORAL_TABLET | Freq: Every day | ORAL | Status: DC
  Filled 2020-10-03 (×6): qty 1

## 2020-10-03 MED ORDER — CEFAZOLIN SODIUM-DEXTROSE 2-4 GM/100ML-% IV SOLN
2.0000 g | Freq: Three times a day (TID) | INTRAVENOUS | Status: AC
  Administered 2020-10-03 – 2020-10-04 (×2): 2 g via INTRAVENOUS
  Filled 2020-10-03 (×2): qty 100

## 2020-10-03 MED ORDER — DICLOFENAC SODIUM 1 % EX GEL
4.0000 g | Freq: Four times a day (QID) | CUTANEOUS | Status: DC
  Administered 2020-10-03 – 2020-10-10 (×20): 4 g via TOPICAL
  Filled 2020-10-03: qty 100

## 2020-10-03 SURGICAL SUPPLY — 64 items
APL SKNCLS STERI-STRIP NONHPOA (GAUZE/BANDAGES/DRESSINGS) ×2
BAG COUNTER SPONGE SURGICOUNT (BAG) ×3 IMPLANT
BAG SPNG CNTER NS LX DISP (BAG) ×2
BAG SURGICOUNT SPONGE COUNTING (BAG) ×1
BENZOIN TINCTURE PRP APPL 2/3 (GAUZE/BANDAGES/DRESSINGS) ×4 IMPLANT
BUR SABER RD CUTTING 3.0 (BURR) ×3 IMPLANT
BUR SABER RD CUTTING 3.0MM (BURR) ×1
CANISTER SUCT 3000ML PPV (MISCELLANEOUS) ×6 IMPLANT
CLOSURE WOUND 1/2 X4 (GAUZE/BANDAGES/DRESSINGS) ×1
COVER MAYO STAND STRL (DRAPES) ×4 IMPLANT
COVER SURGICAL LIGHT HANDLE (MISCELLANEOUS) ×4 IMPLANT
DRAPE C-ARM 42X72 X-RAY (DRAPES) IMPLANT
DRAPE HALF SHEET 40X57 (DRAPES) IMPLANT
DRAPE MICROSCOPE LEICA (MISCELLANEOUS) ×4 IMPLANT
DRAPE SURG 17X23 STRL (DRAPES) ×16 IMPLANT
DRSG MEPILEX BORDER 4X4 (GAUZE/BANDAGES/DRESSINGS) IMPLANT
DRSG MEPILEX BORDER 4X8 (GAUZE/BANDAGES/DRESSINGS) ×2 IMPLANT
DURAPREP 26ML APPLICATOR (WOUND CARE) ×4 IMPLANT
ELECT BLADE 4.0 EZ CLEAN MEGAD (MISCELLANEOUS) ×4
ELECT BLADE INSULATED 4IN (ELECTROSURGICAL) ×4
ELECT CAUTERY BLADE 6.4 (BLADE) ×4 IMPLANT
ELECT REM PT RETURN 9FT ADLT (ELECTROSURGICAL) ×4
ELECTRODE BLADE INSULATED 4IN (ELECTROSURGICAL) IMPLANT
ELECTRODE BLDE 4.0 EZ CLN MEGD (MISCELLANEOUS) ×2 IMPLANT
ELECTRODE REM PT RTRN 9FT ADLT (ELECTROSURGICAL) ×2 IMPLANT
GLOVE SRG 8 PF TXTR STRL LF DI (GLOVE) ×2 IMPLANT
GLOVE SURG 8.5 LATEX PF (GLOVE) ×4 IMPLANT
GLOVE SURG LTX SZ8.5 (GLOVE) ×4 IMPLANT
GLOVE SURG ORTHO LTX SZ7.5 (GLOVE) ×4 IMPLANT
GLOVE SURG UNDER POLY LF SZ8 (GLOVE) ×4
GOWN STRL REUS W/ TWL LRG LVL3 (GOWN DISPOSABLE) ×2 IMPLANT
GOWN STRL REUS W/TWL 2XL LVL3 (GOWN DISPOSABLE) ×8 IMPLANT
GOWN STRL REUS W/TWL LRG LVL3 (GOWN DISPOSABLE) ×4
KIT BASIN OR (CUSTOM PROCEDURE TRAY) ×4 IMPLANT
KIT TURNOVER KIT B (KITS) ×4 IMPLANT
MAT PREVALON FULL STRYKER (MISCELLANEOUS) ×2 IMPLANT
NDL HYPO 21X1.5 SAFETY (NEEDLE) IMPLANT
NDL SPNL 18GX3.5 QUINCKE PK (NEEDLE) ×4 IMPLANT
NEEDLE HYPO 21X1.5 SAFETY (NEEDLE) ×4 IMPLANT
NEEDLE SPNL 18GX3.5 QUINCKE PK (NEEDLE) ×4 IMPLANT
NS IRRIG 1000ML POUR BTL (IV SOLUTION) ×4 IMPLANT
PACK LAMINECTOMY ORTHO (CUSTOM PROCEDURE TRAY) ×4 IMPLANT
PAD ARMBOARD 7.5X6 YLW CONV (MISCELLANEOUS) ×10 IMPLANT
PATTIES SURGICAL .5 X.5 (GAUZE/BANDAGES/DRESSINGS) IMPLANT
PATTIES SURGICAL .75X.75 (GAUZE/BANDAGES/DRESSINGS) IMPLANT
SPONGE INTESTINAL PEANUT (DISPOSABLE) ×2 IMPLANT
SPONGE SURGIFOAM ABS GEL 100 (HEMOSTASIS) ×4 IMPLANT
SPONGE T-LAP 4X18 ~~LOC~~+RFID (SPONGE) IMPLANT
STAPLER VISISTAT 35W (STAPLE) ×2 IMPLANT
STRIP CLOSURE SKIN 1/2X4 (GAUZE/BANDAGES/DRESSINGS) ×3 IMPLANT
SUT BONE WAX W31G (SUTURE) ×2 IMPLANT
SUT VIC AB 0 CT1 18XCR BRD8 (SUTURE) IMPLANT
SUT VIC AB 0 CT1 8-18 (SUTURE) ×4
SUT VIC AB 1 CTX 36 (SUTURE) ×12
SUT VIC AB 1 CTX36XBRD ANBCTR (SUTURE) ×2 IMPLANT
SUT VIC AB 2-0 CT1 27 (SUTURE) ×4
SUT VIC AB 2-0 CT1 TAPERPNT 27 (SUTURE) ×2 IMPLANT
SUT VIC AB 3-0 X1 27 (SUTURE) ×4 IMPLANT
SUT VICRYL 0 UR6 27IN ABS (SUTURE) ×4 IMPLANT
SYR 20ML LL LF (SYRINGE) ×6 IMPLANT
SYR CONTROL 10ML LL (SYRINGE) ×4 IMPLANT
TOWEL GREEN STERILE (TOWEL DISPOSABLE) ×4 IMPLANT
TOWEL GREEN STERILE FF (TOWEL DISPOSABLE) ×4 IMPLANT
WATER STERILE IRR 1000ML POUR (IV SOLUTION) ×4 IMPLANT

## 2020-10-03 NOTE — Anesthesia Postprocedure Evaluation (Signed)
Anesthesia Post Note  Patient: [name]  Procedure(s) Performed: THORACOLUMBAR LAMINECTOMIES THORACIC ELEVEN-TWELVE, THORACIC TWELVE-LUMBAR ONE  AND LUMBAT ONE-TWO KNEE INJECTION (Right: Knee)     Patient location during evaluation: PACU Anesthesia Type: General Level of consciousness: awake and alert, awake and oriented Pain management: pain level controlled Vital Signs Assessment: post-procedure vital signs reviewed and stable Respiratory status: spontaneous breathing, nonlabored ventilation and respiratory function stable Cardiovascular status: blood pressure returned to baseline and stable Postop Assessment: no apparent nausea or vomiting Anesthetic complications: no   No notable events documented.  Last Vitals:  Vitals:   10/03/20 1430 10/03/20 1445  BP: (!) 151/70 (!) 153/99  Pulse: 86 90  Resp: (!) 27 15  Temp:  (!) 36.4 C  SpO2: 94% 96%    Last Pain:  Vitals:   10/03/20 1445  TempSrc:   PainSc: Asleep                 Catalina Gravel

## 2020-10-03 NOTE — H&P (Signed)
Patient has requested right knee intraarticular injection with depomedrol and marcainel. This will be done prior to turning for placement on the OR table.

## 2020-10-03 NOTE — Anesthesia Procedure Notes (Signed)
Procedure Name: Intubation Date/Time: 10/03/2020 7:55 AM Performed by: Annamary Carolin, CRNA Pre-anesthesia Checklist: Patient identified, Emergency Drugs available, Suction available and Patient being monitored Patient Re-evaluated:Patient Re-evaluated prior to induction Oxygen Delivery Method: Circle System Utilized Preoxygenation: Pre-oxygenation with 100% oxygen Induction Type: IV induction Ventilation: Mask ventilation without difficulty Laryngoscope Size: Mac and 3 Grade View: Grade I Tube type: Oral Number of attempts: 1 Airway Equipment and Method: Stylet and Oral airway Placement Confirmation: ETT inserted through vocal cords under direct vision, positive ETCO2 and breath sounds checked- equal and bilateral Secured at: 22 cm Tube secured with: Tape Dental Injury: Teeth and Oropharynx as per pre-operative assessment

## 2020-10-03 NOTE — Interval H&P Note (Signed)
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  The various methods of treatment have been discussed with the patient and family. After consideration of risks, benefits and other options for treatment, the patient has consented to  Procedure(s): THORACOLUMBAR LAMINECTOMIES T11-12, T12-L1 AND L1-2 (N/A) as a surgical intervention.  The patient's history has been reviewed, patient examined, no change in status, stable for surgery.  I have reviewed the patient's chart and labs.  Questions were answered to the patient's satisfaction.     [name]

## 2020-10-03 NOTE — Op Note (Signed)
10/03/2020  45:80 AM  PATIENT:  [name]  55 y.o. 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    SURGEON:  Jessy Oto, MD     ASSISTANT:  Benjiman Core, PA-C  (Present throughout the entire procedure and necessary for completion of procedure in a timely manner)     ANESTHESIA:  General, supplemented with local infiltration. Dr. [name].  Marcaine 0.5% total 25cc, 5 cc right knee, 20cc midline incision lumbar spine.     COMPLICATIONS:  None.   EBL: 300CC  DRAINS: Foley to SD.     PROCEDURE: The patient was met in the holding area, and the appropriate T11 to L1-2levels identified and marked with an "X" and my initials and the right knee also marked with an X and my initials. The patient was then transported to OR and was placed under general anesthesia without difficulty. Nursing staff inserted a Foley catheter under sterile conditions prior to turning. A time out protocol performed and right knee prepped anteromedially with betadiene and the right knee then injected intraarticularly with 5 cc of 0.5% marcaine and 1 cc of 40 mg Depomedrol /cc. Bandaid applied to the right knee. Then the patient was transferred to the the operative table in a prone position. The [facility] spine OR table was used. All areas of skin pressure were protected. The patient received appropriate preoperative antibiotic prophylaxis 3 grams ancef.  Standard prep with DuraPrep solution from the mid dorsal spine to the mid sacral level.Time-out procedure was called and correct .  Patient was draped in the usual manner. Iodine Vi-Drape was used. The skin over the upper lumbar incision  infiltrated with marcaine 0.5% 20 cc and incision made ellipsing the upper 25% of the previous incision scar, the incision cared down to the lumbodorsal fascia and the spinous processes located in the depth of the incision. A small viper retractor used for initial exposure. Intraoperative lateral radiograph obtained with Kocher clamps placed at the intraspinous space T12-L1 and the upper aspect of the T12 spinous process, demonstrated the upper clamp at the upper T12 level and the lower clamp placed posterior to the T12-L1 interspinous level. Initial incisions were made at the very superior aspect of the previous incision scar and this was at first palpable process representing L2 and at the lowest aspect of the incision at the expected level of L4-5 approximately 3 1/2 - 4 inches in length. The incision were infiltrated with Marcaine half percent. Total of 20 cc were used. Skin subcutaneous layers divide down to the lumbodorsal fascia this was incised on both sides of the T10-11-12 and L1-L2 spinous processes. These areas were then packed. Deeper viper retractor was inserted at the incision site. Leksell rongeur used to remove a small portion of the inferior aspect is process of T10  and each of the spinous processes of T11, T12  and L1.  Leksell rongeur was then used to further remove bone down to the ligamentum flavum and the central portions of the lamina and base of the spinous process were thinned. This facets were then exposed out laterally and were hypertrophic.High speed burr then used thin the central portions of the lamina of T11, T12, and L1 and medial aspect of the T11-12 facet, T12-L1and  LL1-2 facet bilaterally resecting approximately 10-15% of the facet bilaterally. Kerrisons were then used to resect central portions of the lamina of T11, T12 and L1. Ligamentum flavum at the T11, T12 and L1 levels were then carefully resected centrally preserving at least 7-8 mm with at the pars level T11, T12 and L1.  The central laminectomy was further widened performing more resection of the medial aspect of facet at both T12-L1 and L1-2. Note that loupe magnification and headlight was used for this portion procedure.the central portions of the ligamentum flavum at the T12-L1 level were resected and the medial aspect of the T12-L1 facet resected over about 5-10%. Hypertrophic flava was found to be present. The operating room microscope sterilely draped brought into the field and then carefully the right side decompressed at each level beginning at the right L2neuroforamen resect bone over the superior lamina of L2 and decompressing the right L1 foramen and reflected portion ligamentum flavum off the medial aspect of the right L1-2 facet. Hockey-stick nerve probe could be passed out both the L1 and L2neuroforamen. The medial aspect of facet at the T12-L1 level was then carefully evaluated and hypertrophic ligamentum flavum resected using Kerrisons decompressing the lateral recess on this right side. This was done such that hockey-stick nerve probe could be used to pass the outer recess demonstrating patency and decompression of the L1 and T12 nerve roots. Attention then turned to the T11-12 were further debridement of the lateral recess and hypertrophic ligamentum flavum and reflected portions of ligamentum flavum were carried out. Changing sides of the OR table in decompression was carried out in similar fashion on the left side from the left L2 nerve root to the L1-2 level. At the left L2 level severe lateral recess stenosis was noted lateral recess was decompressed and the L2 and L1 nerve roots were freed up.  Under the OR microscope Derricho retractor used to retract the thecal sac on the left side and the left L2 neuroforamen was decompressed and a Woodson neuroprobe was able to be passed out the L2 and L1 neuroforamen. Further decompression was then carried out the left side of the spinal canal decompressing the T12 and  the T11 nerve roots. Following this then a hockey-stick nerve probe could be passed out easily bilateral T11, T12, L1 and L2 neuroforamen. Following decompression bilaterally thrombin-soaked Gelfoam was applied to the laminotomy defects and these areas packed with small sponges. Adequate hemostasis was obtained at all levels. The thrombin-soaked Gelfoam was then removed and a medium. Viper retractor was then removed. The end of the case a approximately 15 mm the central laminotomy was present with normal pulsation of the thecal sac present. Following additional irrigation and with no active bleeding present at any level level, the incision was then closed by first approximating her lumbar muscles the midline with interrupted #1 Vicryl sutures loosely the lumbodorsal fascia then approximated with interrupted #1 Vicryl sutures. Deep subcutaneous layers approximated with interrupted #1-0 Vicryl suture more superficial layers with interrupted 2-0 Vicryl suture the skin closed with a running subcutaneous stitch of 4-0 Vicryl at both levels. All Mepilex bandage is applied to both sites. All instrument and sponge counts were correct. Patient was then reactivated returned to supine position and extubated. He was then returned to recovery room in satisfactory condition.  Benjiman Core, PA-C for the duties of assistant surgeon throughout this case He assisted loupe magnification retracting delicate neural structures suctioning over the counter neural structures throughout the cane bilaterally at the lower  bar segment and superiorly. Is present from the beginning of the case to the end of the case. Assisted in positioning the patient having in positioning the arm and legs. He also participated in removal the patient from the operating table.            [name]  10/03/2020, 11:33 AM

## 2020-10-03 NOTE — Progress Notes (Addendum)
Contacted [facility] for results of covid test.  Patient was tested on 09/29/20.  Waiting on a return call back with results.   Per Noreene Larsson at [facility], patient is negative for Covid.  Awaiting results to be sent.

## 2020-10-03 NOTE — H&P (Signed)
[name] is an 55 y.o. female.   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  States that symptoms unchanged in previous visit.  She is wanting to proceed with laminectomies at the above mentioned levels.  We received preop medical and cardiac clearances.  Today history and physical performed.  Review of systems negative.  Past Medical History:  Diagnosis Date   Alcoholism ([place])    Anxiety    Arthritis 04-10-11   hips, shoulders, back   Asthma    Bipolar disorder ([place])    Cardiomegaly    Carpal tunnel syndrome, bilateral    Cervical cancer ([place]) 1993   cervical, no treatment done, went away per pt   Cervical dysplasia or atypia 04-10-11   '93- once dx.-got pregnant-no intervention, then postpartum, no dysplasia found   CHF (congestive heart failure) ([place])    no cardiologist 2014 dx, none now   Chronic hypoxemic respiratory failure (HCC)    Chronic kidney disease    stage 3 kidney disease   Condyloma - gluteal cleft 04/09/2011   Removed by general surgery. Pathology showed Condyloma, gluteal CONDYLOMA ACUMINATUM.    COPD (chronic obstructive pulmonary disease) ([place])    Depression    Diabetes mellitus without complication ([place])    Dyspnea    with actity, sitting   Fall 12/16/2016   Fibrocystic breast disease    GERD (gastroesophageal reflux disease)    Grade I diastolic dysfunction 17/49/4496   Noted on ECHO   Hepatitis    hep B-count is low at present,doesn't register   History of PSVT (paroxysmal supraventricular tachycardia)    Hypercalcemia    Hyperlipidemia    Hypertension    Incomplete left bundle branch block (LBBB) 09/24/2018   Noted on EKG   Lung nodule 11/2017   right and left lung   Migraine headache    none recent   Morbid obesity ([place]) 03/27/2006   Neuropathy    Pneumonia     walking pneu 15 yrs. ago   Skin lesion 03/15/2011   In gluteal crease now s/p removal by Dr. [name] of General Surgery on 3/12. Path shows condyloma.      Sleep apnea 04-10-11   uses cpap, pt does not know settings   Trigger finger    left third   Urge incontinence of urine     Past Surgical History:  Procedure Laterality Date   ANAL FISTULECTOMY  04/17/2011   Procedure: FISTULECTOMY ANAL;  Surgeon: Imogene Burn. [name], MD;  Location: [facility];  Service: General;  Laterality: N/A;  Excision of Condyloma Gluteal Cleft    ANAL RECTAL MANOMETRY N/A 03/26/2019   Procedure: ANO RECTAL MANOMETRY;  Surgeon: Thornton Park, MD;  [facility]: [facility];  Service: Gastroenterology;  Laterality: N/A;   BACK SURGERY  2009   pt thinks it was an "interbody fusion"   BIOPSY  03/09/2019   Procedure: BIOPSY;  Surgeon: Thornton Park, MD;  Location: [facility];  Service: Gastroenterology;;   BOTOX INJECTION N/A 12/29/2018   Procedure: BOTOX INJECTION(100 UNITS)  WITH CYSTOSCOPY;  Surgeon: Ceasar Mons, MD;  [facility]: [facility];  Service: Urology;  Laterality: N/A;   BOTOX INJECTION N/A 07/27/2019   Procedure: BOTOX INJECTION WITH CYSTOSCOPY;  Surgeon: Ceasar Mons, MD;  [facility];  Service: Urology;  Laterality: N/A;  ONLY NEEDS 30 MIN  BREAST CYST EXCISION     bilateral breast, 3 cysts removed from each breast   BREAST EXCISIONAL BIOPSY Right    x 3   BREAST EXCISIONAL BIOPSY Left    x 3   CARPAL TUNNEL RELEASE Bilateral    Cervical biospy     CERVICAL FUSION  04/10/2011   x3- cervical fusion with plating and screws-Dr. [name]   COLONOSCOPY WITH PROPOFOL N/A 07/06/2015   Procedure: COLONOSCOPY WITH PROPOFOL;  Surgeon: [name], MD;  [facility];  Service: Endoscopy;  Laterality: N/A;   COLONOSCOPY WITH PROPOFOL N/A 03/09/2019   Procedure: COLONOSCOPY WITH PROPOFOL;  Surgeon: Thornton Park, MD;  [facility];  Service: Gastroenterology;   Laterality: N/A;   DOPPLER ECHOCARDIOGRAPHY  04/06/2012   AT [place] 55-60%   FOOT SURGERY Left 2018   FRACTURE SURGERY     little toe left foot   I & D EXTREMITY Left 12/18/2016   Procedure: IRRIGATION AND DEBRIDEMENT LEFT FOOT, CLOSURE;  Surgeon: [name], MD;  Location: [facility];  Service: Orthopedics;  Laterality: Left;   LAPAROSCOPIC GASTRIC SLEEVE RESECTION N/A 05/08/2020   Procedure: LAPAROSCOPIC GASTRIC SLEEVE RESECTION;  Surgeon: Kieth Brightly Arta Bruce, MD;  [facility];  Service: General;  Laterality: N/A;   [place] SURGERY  04/10/2011   x5-Lumbar fusion-retained hardware.(Dr. Louanne Skye)   MULTIPLE TOOTH EXTRACTIONS     POLYPECTOMY  03/09/2019   Procedure: POLYPECTOMY;  Surgeon: Thornton Park, MD;  [facility];  Service: Gastroenterology;;   SHOULDER ARTHROSCOPY WITH ROTATOR CUFF REPAIR AND SUBACROMIAL DECOMPRESSION Left 05/07/2019   Procedure: LEFT SHOULDER ARTHROSCOPY WITH DEBRIDEMENT, DISTAL CLAVICLE EXCISION,  SUBACROMIAL DECOMPRESSION AND POSSIBLE ROTATOR CUFF REPAIR;  Surgeon: [name], MD;  Location: [facility];  Service: Orthopedics;  Laterality: Left;   TEE WITHOUT CARDIOVERSION N/A 04/06/2012   Procedure: TRANSESOPHAGEAL ECHOCARDIOGRAM (TEE);  Surgeon: [name], MD;  Location: [facility];  Service: Cardiovascular;  Laterality: N/A;   TRIGGER FINGER RELEASE Right    middle finger   TRIGGER FINGER RELEASE Left 06/28/2016   Procedure: RELEASE TRIGGER FINGER LEFT 3RD FINGER;  Surgeon: [name], MD;  Location: [facility];  Service: Orthopedics;  Laterality: Left;   TRIGGER FINGER RELEASE Right 06/12/2016   Procedure: RIGHT INDEX FINGER TRIGGER RELEASE;  Surgeon: [name], MD;  Location: [facility];  Service: Orthopedics;  Laterality: Right;   TRIGGER FINGER RELEASE Right 01/19/2018   Procedure: RIGHT RING FINGER TRIGGER FINGER RELEASE;  Surgeon: [name], MD;  Location: [facility];  Service: Orthopedics;  Laterality: Right;   TRIGGER  FINGER RELEASE Left 05/07/2019   Procedure: RELEASE TRIGGER FINGER LEFT INDEX FINGER;  Surgeon: [name], MD;  Location: [facility];  Service: Orthopedics;  Laterality: Left;   UPPER GI ENDOSCOPY N/A 05/08/2020   Procedure: UPPER GI ENDOSCOPY;  Surgeon: Kieth Brightly, Arta Bruce, MD;  [facility];  Service: General;  Laterality: N/A;    Family History  Problem Relation Age of Onset   Pulmonary embolism Mother    Stroke Father    Alcohol abuse Father    Pneumonia Father    Hypertension Father    Breast cancer Maternal Aunt    Hypertension Other    Diabetes Other        Aunts and cousins   Asthma Sister    Depression Sister    Arthritis Sister    Sickle cell anemia Son    Social History:  reports that she has been  smoking cigarettes. She started smoking about 43 years ago. She has a 8.00 pack-year smoking history. She has never used smokeless tobacco. She reports that she does not currently use drugs after having used the following drugs: Marijuana and "Crack" cocaine. She reports that she does not drink alcohol.  Allergies:  Allergies  Allergen Reactions   Aspirin Other (See Comments)    Kidney disease   Methadone Hcl Other (See Comments)     "blacked out" in 1990's   Corticosteroids Other (See Comments)    Hyperglycemia    Levofloxacin Itching    Medications Prior to Admission  Medication Sig Dispense Refill   acetaminophen (TYLENOL) 500 MG tablet Take 1,000 mg by mouth 2 (two) times daily.     albuterol (VENTOLIN HFA) 108 (90 Base) MCG/ACT inhaler Inhale 2 puffs into the lungs every 6 (six) hours as needed for wheezing or shortness of breath. 18 g 3   allopurinol (ZYLOPRIM) 300 MG tablet Take 300 mg by mouth in the morning.     atorvastatin (LIPITOR) 40 MG tablet TAKE 1 TABLET BY MOUTH EVERY DAY (Patient taking differently: Take 40 mg by mouth in the morning.) 90 tablet 3   baclofen (LIORESAL) 10 MG tablet Take 1 tablet (10 mg total) by mouth 3 (three) times daily as  needed for muscle spasms. (Patient taking differently: Take 10 mg by mouth 3 (three) times daily.) 90 each 0   busPIRone (BUSPAR) 10 MG tablet Take 1 tablet (10 mg total) by mouth 3 (three) times daily. 270 tablet 4   calcium carbonate (OSCAL) 1500 (600 Ca) MG TABS tablet Take 1,500 mg by mouth 3 (three) times daily with meals.     diltiazem (CARDIZEM CD) 120 MG 24 hr capsule TAKE 1 CAPSULE (120 MG TOTAL) BY MOUTH 2 (TWO) TIMES DAILY. 180 capsule 2   docusate sodium (COLACE) 100 MG capsule Take 200 mg by mouth 2 (two) times daily.     fluticasone (FLONASE) 50 MCG/ACT nasal spray PLACE 2 SPRAYS INTO BOTH NOSTRILS TWICE DAILY 48 mL 33   Fluticasone-Umeclidin-Vilant (TRELEGY ELLIPTA) 100-62.5-25 MCG/INH AEPB Inhale 1 puff into the lungs daily. 180 each 3   gabapentin (NEURONTIN) 300 MG capsule Take 2 capsules (600 mg total) by mouth 3 (three) times daily. TAKE 2 CAPSULES (600 MG TOTAL) BY MOUTH in the morning and at lunch and take 3 capsules at night 540 capsule 2   HUMALOG KWIKPEN 100 UNIT/ML KwikPen INJECT 18 UNITS 3 TIMES A DAY BEFORE MEALS (Patient taking differently: Inject 3-4 Units into the skin 3 (three) times daily.) 3 mL 3   HYDROcodone-acetaminophen (NORCO) 10-325 MG tablet Take 1 tablet by mouth in the morning, at noon, in the evening, and at bedtime.     hydrOXYzine (ATARAX/VISTARIL) 10 MG tablet TAKE 1 TABLET BY MOUTH EVERY DAY 90 tablet 3   ketoconazole (NIZORAL) 2 % cream APPLY 1 FINGERTIP AMOUNT TO EACH FOOT DAILY. (Patient taking differently: Apply 1 application topically in the morning, at noon, and at bedtime.) 30 g 2   loratadine (CLARITIN) 10 MG tablet Take 1 tablet (10 mg total) by mouth daily. 30 tablet 11   losartan (COZAAR) 100 MG tablet Take 1 tablet (100 mg total) by mouth daily. 90 tablet 3   metolazone (ZAROXOLYN) 2.5 MG tablet TAKE 1 TABLET EVERY OTHER DAY. TAKE 1 TABLET TWICE WEEKLY ON MONDAYS AND THURSDAYS ONLY 45 tablet 7   montelukast (SINGULAIR) 10 MG tablet TAKE 1  TABLET BY MOUTH EVERYDAY AT BEDTIME 90  tablet 3   Multiple Vitamins-Minerals (BARIATRIC MULTIVITAMINS/IRON) CAPS Take 1 capsule by mouth daily.     nystatin (MYCOSTATIN) 100000 UNIT/ML suspension TAKE 5 MLS (500,000 UNITS TOTAL) BY MOUTH 4 (FOUR) TIMES DAILY. SWISH AND SWALLOW. 60 mL 3   omeprazole (PRILOSEC) 20 MG capsule TAKE 1 CAPSULE BY MOUTH EVERY DAY 90 capsule 3   Potassium Chloride ER 20 MEQ TBCR Take 20 mEq by mouth daily.     varenicline (CHANTIX) 1 MG tablet Take 1 tablet (1 mg total) by mouth 2 (two) times daily. 60 tablet 3   venlafaxine XR (EFFEXOR-XR) 75 MG 24 hr capsule TAKE 1 CAPSULE BY MOUTH DAILY WITH BREAKFAST. 90 capsule 2   ACCU-CHEK AVIVA PLUS test strip 1 EACH BY OTHER ROUTE 3 (THREE) TIMES DAILY. TEST 3 TIMES DAILY 300 strip 3   Accu-Chek Softclix Lancets lancets USE TO TEST 4 TIMES A DAY 200 each 6   ammonium lactate (LAC-HYDRIN) 12 % lotion APPLY TWICE A DAY AS NEEDED FOR DRY SKIN (Patient taking differently: Apply 1 application topically in the morning, at noon, and at bedtime.) 400 mL 3   b complex vitamins capsule Take 1 capsule by mouth daily.     B-D UF III MINI PEN NEEDLES 31G X 5 MM MISC CHECK SUGARS 4 TIMES A DAY BEFORE MEALS AND AT BEDTIME. 100 each 12   blood glucose meter kit and supplies KIT Dispense based on patient and insurance preference. Use up to four times daily as directed. (FOR ICD-9 250.00, 250.01). 1 each 0   diclofenac Sodium (VOLTAREN) 1 % GEL APPLY 2 GRAMS TO AFFECTED AREA 4 TIMES A DAY 500 g 1   enoxaparin (LOVENOX) 40 MG/0.4ML injection Inject 0.4 mLs (40 mg total) into the skin daily. (Patient not taking: No sig reported) 6 mL 0   NARCAN 4 MG/0.1ML LIQD nasal spray kit Place 1 spray into the nose as needed (accidental overdose).     NON FORMULARY Uses a C-PAP at bedtime     ondansetron (ZOFRAN-ODT) 4 MG disintegrating tablet Take 1 tablet (4 mg total) by mouth every 6 (six) hours as needed for nausea or vomiting. (Patient not taking: No sig  reported) 20 tablet 0   Semaglutide,0.25 or 0.5MG/DOS, (OZEMPIC, 0.25 OR 0.5 MG/DOSE,) 2 MG/1.5ML SOPN Inject 0.25 mg into the skin once a week. Minus 2 clicks (Patient not taking: No sig reported) 4.5 mL 0    Results for orders placed or performed during the hospital encounter of 10/03/20 (from the past 48 hour(s))  Glucose, capillary     Status: Abnormal   Collection Time: 10/03/20  5:56 AM  Result Value Ref Range   Glucose-Capillary 144 (H) 70 - 99 mg/dL    Comment: Glucose reference range applies only to samples taken after fasting for at least 8 hours.   *Note: Due to a large number of results and/or encounters for the requested time period, some results have not been displayed. A complete set of results can be found in Results Review.   No results found.  Review of Systems  Constitutional:  Positive for activity change.  Respiratory: Negative.    Cardiovascular: Negative.   Gastrointestinal: Negative.   Genitourinary: Negative.   Musculoskeletal:  Positive for back pain and gait problem.  Skin: Negative.    Blood pressure (!) 165/75, pulse 72, temperature 97.7 F (36.5 C), temperature source Oral, resp. rate 19, height _0  (1.753 m), weight (!) 142 kg, last menstrual period 04/10/2006, SpO2 96 %. Physical  Exam HENT:     Head: Normocephalic and atraumatic.  Eyes:     Extraocular Movements: Extraocular movements intact.  Pulmonary:     Effort: Pulmonary effort is normal. No respiratory distress.     Breath sounds: Normal breath sounds.  Abdominal:     General: Bowel sounds are normal. There is no distension.  Musculoskeletal:        General: Tenderness present.  Neurological:     Mental Status: She is alert.  Psychiatric:        Mood and Affect: Mood normal.     Assessment/Plan T11-L2 stenosis  We will proceed with surgery as scheduled.  All questions answered.    Benjiman Core, PA-C 10/03/2020, 6:45 AM

## 2020-10-03 NOTE — Brief Op Note (Signed)
10/03/2020  01:65 AM  PATIENT:  [name]  55 y.o. female  PRE-OPERATIVE DIAGNOSIS:  Thoracolumbar spinal stenosis T11-12, T12-L1, L1-2  POST-OPERATIVE DIAGNOSIS:  Thoracolumbar spinal stenosis T11-12, T12-L1, L1-2  PROCEDURE:  Procedure(s): THORACOLUMBAR LAMINECTOMIES THORACIC ELEVEN-TWELVE, THORACIC TWELVE-LUMBAR ONE  AND LUMBAT ONE-TWO (N/A) KNEE INJECTION (Right)  SURGEON:  Surgeon(s) and Role:    * Jessy Oto, MD - Primary  PHYSICIAN ASSISTANT: Benjiman Core, PA-C  ANESTHESIA:   local and general, Dr. [name].   EBL:  300 mL   BLOOD ADMINISTERED:none  DRAINS: Urinary Catheter (Foley)   LOCAL MEDICATIONS USED:  MARCAINE 0.5%   and Amount: 25 ml  SPECIMEN:  No Specimen  DISPOSITION OF SPECIMEN:  N/A  COUNTS:  YES  TOURNIQUET:  * No tourniquets in log *  DICTATION: .Dragon Dictation  PLAN OF CARE: Admit to inpatient   PATIENT DISPOSITION:  PACU - hemodynamically stable.   Delay start of Pharmacological VTE agent (>24hrs) due to surgical blood loss or risk of bleeding: yes

## 2020-10-03 NOTE — Progress Notes (Signed)
This Probation officer got an order from [employer], ordered Oxy IR 5-15mg  q 4 hrs PRN for severe pain and discontinue all Hydrocodone orders both scheduled & PRN; will pass it on to night shift.

## 2020-10-03 NOTE — Plan of Care (Signed)
  Problem: Education: Goal: Knowledge of General Education information will improve Description: Including pain rating scale, medication(s)/side effects and non-pharmacologic comfort measures Outcome: Progressing   Problem: Health Behavior/Discharge Planning: Goal: Ability to manage health-related needs will improve Outcome: Progressing   Problem: Coping: Goal: Level of anxiety will decrease Outcome: Progressing   Problem: Elimination: Goal: Will not experience complications related to bowel motility Outcome: Progressing   Problem: Pain Managment: Goal: General experience of comfort will improve Outcome: Progressing   Problem: Safety: Goal: Ability to remain free from injury will improve Outcome: Progressing   Problem: Skin Integrity: Goal: Risk for impaired skin integrity will decrease Outcome: Progressing

## 2020-10-03 NOTE — Progress Notes (Signed)
Subjective: Day of Surgery Procedure(s) (LRB): THORACOLUMBAR LAMINECTOMIES THORACIC ELEVEN-TWELVE, THORACIC TWELVE-LUMBAR ONE  AND LUMBAT ONE-TWO (N/A) KNEE INJECTION (Right) Patient reports pain as severe.  Patient seen at bedside tonight due to increasing pain to the RLE since awakening from surgery.  Pain initially started in the right knee and has moved down the anterior leg to the top of the toe.  The pain also radiates proximally to the right pelvis.  She describes the pain as knife jabbing and burning. Worse with any movement of the RLE. She denies any numbness.  No saddle paresthesias.  She has a foley in place.  No chest pain/pressure/sob.    Objective: Vital signs in last 24 hours: Temp:  [97.5 F (36.4 C)-98 F (36.7 C)] 98 F (36.7 C) (09/06 1956) Pulse Rate:  [72-101] 101 (09/06 1956) Resp:  [14-43] 18 (09/06 1956) BP: (113-165)/(50-108) 160/90 (09/06 1956) SpO2:  [94 %-100 %] 99 % (09/06 1956) Weight:  [142 kg] 142 kg (09/06 0553)  Intake/Output from previous day: No intake/output data recorded. Intake/Output this shift: No intake/output data recorded.  No results for input(s): HGB in the last 72 hours. No results for input(s): WBC, RBC, HCT, PLT in the last 72 hours. No results for input(s): NA, K, CL, CO2, BUN, CREATININE, GLUCOSE, CALCIUM in the last 72 hours. No results for input(s): LABPT, INR in the last 72 hours.  Neurologically intact Neurovascular intact Sensation intact distally Intact pulses distally Compartment soft RLE- marked tenderness to palpation throughout entire RLE.  Will slightly flex/extend knee and big toe, but appears to be pain mediated.  2+ dorsalis pedis and posterior tibial pulses.  Sensation intact.  Calf soft.    Assessment/Plan: Day of Surgery Procedure(s) (LRB): THORACOLUMBAR LAMINECTOMIES THORACIC ELEVEN-TWELVE, THORACIC TWELVE-LUMBAR ONE  AND LUMBAT ONE-TWO (N/A) KNEE INJECTION (Right) Up with therapy Pain appears to be nerve  related.  It is reassuring that her motor function appears to be intact.   Patient is already on oxycodone 15mg  in addition to robaxin and gabapentin.  I have ordered IV dilaudid and will order a once time dose of toradol.   Dr. [name] to further evaluate in the am.       [name] 10/03/2020, 10:12 PM

## 2020-10-03 NOTE — Discharge Instructions (Addendum)
° ° °  No lifting greater than 10 lbs. °Avoid bending, stooping and twisting. °Walk in house for first week them may start to get out slowly increasing distance up to one quarter mile by 3 weeks post op. °Keep incision dry for 3 days, may use tegaderm or similar water impervious dressing. ° °

## 2020-10-03 NOTE — Progress Notes (Signed)
RT NOTES: Called to PACU for patient, placed on auto bipap through dreamstation. Sats 100%. Will continue to monitor.

## 2020-10-03 NOTE — Progress Notes (Signed)
Mendel Ryder PA on call for Ortho care on the phone talking to the patient in regards to patient's pain.

## 2020-10-03 NOTE — Transfer of Care (Signed)
Immediate Anesthesia Transfer of Care Note  Patient: [name]  Procedure(s) Performed: THORACOLUMBAR LAMINECTOMIES THORACIC ELEVEN-TWELVE, THORACIC TWELVE-LUMBAR ONE  AND LUMBAT ONE-TWO KNEE INJECTION (Right: Knee)  Patient Location: PACU  Anesthesia Type:General  Level of Consciousness: drowsy and patient cooperative  Airway & Oxygen Therapy: Patient Spontanous Breathing and Patient connected to face mask oxygen  Post-op Assessment: Report given to RN and Post -op Vital signs reviewed and stable  Post vital signs: Reviewed and stable  Last Vitals:  Vitals Value Taken Time  BP    Temp    Pulse    Resp    SpO2      Last Pain:  Vitals:   10/03/20 0611  TempSrc:   PainSc: 10-Worst pain ever      Patients Stated Pain Goal: 5 (03/28/29 4388)  Complications: No notable events documented.

## 2020-10-03 NOTE — Progress Notes (Signed)
Pt called RN to room and complaining of severe pain to her right knee. Pt adamant 'something is wrong with my knee, I need to talk to the doctor!.' Pt verbalized that her right knee is hurting bad more than her back. Pt describes pain as sharp and shooting that starts from the top of her right big toe and now climbing up to the bottom of her belly and feels like she is being pricked by needles and stabbed with knives. Pt unable to move right leg due to extreme pain. Pt just given oxycodone 15 mg p.o. Will continue to monitor pt.

## 2020-10-04 ENCOUNTER — Encounter (HOSPITAL_COMMUNITY): Payer: Self-pay | Admitting: Specialist

## 2020-10-04 ENCOUNTER — Inpatient Hospital Stay (HOSPITAL_COMMUNITY): Payer: 59

## 2020-10-04 LAB — CBC
HCT: 37.1 % (ref 36.0–46.0)
Hemoglobin: 13.1 g/dL (ref 12.0–15.0)
MCH: 28.5 pg (ref 26.0–34.0)
MCHC: 35.3 g/dL (ref 30.0–36.0)
MCV: 80.7 fL (ref 80.0–100.0)
Platelets: 275 10*3/uL (ref 150–400)
RBC: 4.6 MIL/uL (ref 3.87–5.11)
RDW: 13.5 % (ref 11.5–15.5)
WBC: 15.5 10*3/uL — ABNORMAL HIGH (ref 4.0–10.5)
nRBC: 0 % (ref 0.0–0.2)

## 2020-10-04 LAB — GLUCOSE, CAPILLARY
Glucose-Capillary: 182 mg/dL — ABNORMAL HIGH (ref 70–99)
Glucose-Capillary: 137 mg/dL — ABNORMAL HIGH (ref 70–99)
Glucose-Capillary: 125 mg/dL — ABNORMAL HIGH (ref 70–99)
Glucose-Capillary: 94 mg/dL (ref 70–99)
Glucose-Capillary: 112 mg/dL — ABNORMAL HIGH (ref 70–99)
Glucose-Capillary: 179 mg/dL — ABNORMAL HIGH (ref 70–99)

## 2020-10-04 LAB — BASIC METABOLIC PANEL
Anion gap: 12 (ref 5–15)
BUN: 13 mg/dL (ref 6–20)
CO2: 24 mmol/L (ref 22–32)
Calcium: 8.5 mg/dL — ABNORMAL LOW (ref 8.9–10.3)
Chloride: 97 mmol/L — ABNORMAL LOW (ref 98–111)
Creatinine, Ser: 1.26 mg/dL — ABNORMAL HIGH (ref 0.44–1.00)
GFR, Estimated: 50 mL/min — ABNORMAL LOW (ref >60)
Glucose, Bld: 283 mg/dL — ABNORMAL HIGH (ref 70–99)
Potassium: 2.9 mmol/L — ABNORMAL LOW (ref 3.5–5.1)
Sodium: 133 mmol/L — ABNORMAL LOW (ref 135–145)

## 2020-10-04 MED ORDER — CHLORHEXIDINE GLUCONATE CLOTH 2 % EX PADS
6.0000 | MEDICATED_PAD | Freq: Every day | CUTANEOUS | Status: DC
  Administered 2020-10-04 – 2020-10-09 (×4): 6 via TOPICAL

## 2020-10-04 MED ORDER — GADOBUTROL 1 MMOL/ML IV SOLN
10.0000 mL | Freq: Once | INTRAVENOUS | Status: AC | PRN
  Administered 2020-10-04: 10 mL via INTRAVENOUS

## 2020-10-04 NOTE — Progress Notes (Signed)
PT Cancellation Note  Patient Details Name: [name] MRN: [id number] DOB: 1965-09-30   Cancelled Treatment:    Reason Eval/Treat Not Completed: Patient declined, no reason specified adamantly refuses therapy today. Wants to wait until after MRI and after the pain in her knee gets better. Refuses offer of therapy returning later- "nothing is happening today". Will continue efforts.   Windell Norfolk, DPT, PN2   Supplemental Physical Therapist [place]    Pager [phone] Acute Rehab Office [phone]

## 2020-10-04 NOTE — Progress Notes (Signed)
Patients reports pain as moderate to severe. Pt reports that she did get some rest although pain wakes her up from sleep. She has received both p.o and IV meds alternatively to manage her pain. Pt also requests to let the foley catheter stays until she's able to work with physical therapist. Will pass to oncoming shift.

## 2020-10-04 NOTE — Plan of Care (Signed)
  Problem: Education: Goal: Knowledge of General Education information will improve Description: Including pain rating scale, medication(s)/side effects and non-pharmacologic comfort measures Outcome: Progressing   Problem: Health Behavior/Discharge Planning: Goal: Ability to manage health-related needs will improve Outcome: Progressing   Problem: Clinical Measurements: Goal: Ability to maintain clinical measurements within normal limits will improve Outcome: Progressing   Problem: Nutrition: Goal: Adequate nutrition will be maintained Outcome: Progressing   Problem: Coping: Goal: Level of anxiety will decrease Outcome: Progressing   Problem: Safety: Goal: Ability to remain free from injury will improve Outcome: Progressing   Problem: Skin Integrity: Goal: Risk for impaired skin integrity will decrease Outcome: Progressing   

## 2020-10-04 NOTE — Progress Notes (Signed)
Patient ID: [name], female   DOB: May 16, 1965, 55 y.o.   MRN: [id number] MRI shows fluid that is normally present post op and air that is also normally seen post operatively. Central laminectomy T11,T12 and L1-2 Is adequate there is thecal sac narrowing but no post operative hematoma or cord signal abnormality. Surgery is not indicated here. Hopefully she will see improvement as time passes as there does not  Appear to be either a post operative hematoma or sign of spinal cord injury.

## 2020-10-04 NOTE — Progress Notes (Signed)
09:00- BLE weakness and severe pain noted during shift assessment. MD Louanne Skye made aware and MRI was ordered.

## 2020-10-04 NOTE — Progress Notes (Signed)
OT Cancellation Note  Patient Details Name: [name] MRN: [id number] DOB: 1965-03-21   Cancelled Treatment:    Reason Eval/Treat Not Completed: Patient declined, no reason specified- pt declined due to pain in RLE (although premedicated) and reports plan for MRI.  Will hold OT until after MRI.   Jolaine Artist, OT Acute Rehabilitation Services Pager [phone] Office [place] 10/04/2020, 10:49 AM

## 2020-10-04 NOTE — Progress Notes (Signed)
     Subjective: 1 Day Post-Op Procedure(s) (LRB): THORACOLUMBAR LAMINECTOMIES THORACIC ELEVEN-TWELVE, THORACIC TWELVE-LUMBAR ONE  AND LUMBAT ONE-TWO (N/A) KNEE INJECTION (Right) Complains of burning sensation both legs diffusely, motor intact post op in the recovery unit. Now complains of the legs painful and the back pain not as severe.  Patient reports pain as marked.    Objective:   VITALS:  Temp:  [97.5 F (36.4 C)-98.4 F (36.9 C)] 98 F (36.7 C) (09/07 0752) Pulse Rate:  [86-101] 90 (09/07 0752) Resp:  [14-43] 17 (09/07 0752) BP: (104-163)/(50-108) 104/71 (09/07 0752) SpO2:  [94 %-100 %] 97 % (09/07 0752)  ABD soft Intact pulses distally Incision: dressing C/D/I and no drainage   LABS Recent Labs    10/04/20 0052  HGB 13.1  WBC 15.5*  PLT 275   Recent Labs    10/04/20 0052  NA 133*  K 2.9*  CL 97*  CO2 24  BUN 13  CREATININE 1.26*  GLUCOSE 283*   No results for input(s): LABPT, INR in the last 72 hours.   Assessment/Plan: 1 Day Post-Op Procedure(s) (LRB): THORACOLUMBAR LAMINECTOMIES THORACIC ELEVEN-TWELVE, THORACIC TWELVE-LUMBAR ONE  AND LUMBAT ONE-TWO (N/A) KNEE INJECTION (Right)  Advance diet Up with therapy Will obtain MRI of the thoracolumbar junction with and without contrast to assess for post op hematoma. She had severe stenosis at the L1-2 level greater than T12-L1 or T11-12. Decompression was uneventful. Need to assess for an epidural hematoma or cord changes.   [name] 10/04/2020, 8:49 AM Patient ID: [name], female   DOB: 1965-02-16, 55 y.o.   MRN: [id number]

## 2020-10-05 LAB — GLUCOSE, CAPILLARY
Glucose-Capillary: 113 mg/dL — ABNORMAL HIGH (ref 70–99)
Glucose-Capillary: 114 mg/dL — ABNORMAL HIGH (ref 70–99)
Glucose-Capillary: 119 mg/dL — ABNORMAL HIGH (ref 70–99)
Glucose-Capillary: 191 mg/dL — ABNORMAL HIGH (ref 70–99)

## 2020-10-05 NOTE — Progress Notes (Signed)
     Subjective: 2 Days Post-Op Procedure(s) (LRB): THORACOLUMBAR LAMINECTOMIES THORACIC ELEVEN-TWELVE, THORACIC TWELVE-LUMBAR ONE  AND LUMBAT ONE-TWO (N/A) KNEE INJECTION (Right) Awake, alert and oriented x4. Complaints of right knee pain and pain at the medial knee injection. Dressing is dry.   Patient reports pain as moderate.    Objective:   VITALS:  Temp:  [98.5 F (36.9 C)-98.9 F (37.2 C)] 98.9 F (37.2 C) (09/08 1317) Pulse Rate:  [82-92] 91 (09/08 1317) Resp:  [16-18] 16 (09/08 1317) BP: (114-134)/(63-79) 126/77 (09/08 1317) SpO2:  [91 %-98 %] 94 % (09/08 1317)  Neurologically intact ABD soft Neurovascular intact Sensation intact distally Intact pulses distally Dorsiflexion/Plantar flexion intact Incision: dressing C/D/I and no drainage   LABS Recent Labs    10/04/20 0052  HGB 13.1  WBC 15.5*  PLT 275   Recent Labs    10/04/20 0052  NA 133*  K 2.9*  CL 97*  CO2 24  BUN 13  CREATININE 1.26*  GLUCOSE 283*   No results for input(s): LABPT, INR in the last 72 hours.   Assessment/Plan: 2 Days Post-Op Procedure(s) (LRB): THORACOLUMBAR LAMINECTOMIES THORACIC ELEVEN-TWELVE, THORACIC TWELVE-LUMBAR ONE  AND LUMBAT ONE-TWO (N/A) KNEE INJECTION (Right)  Advance diet Up with therapy D/C IV fluids Discharge to SNF due to slow rehab.   [name] 10/05/2020, 3:11 PM Patient ID: [name], female   DOB: Nov 27, 1965, 55 y.o.   MRN: [id number]

## 2020-10-05 NOTE — Evaluation (Signed)
Occupational Therapy Evaluation Patient Details Name: [name] MRN: [id number] DOB: Jan 20, 1966 Today's Date: 10/05/2020    History of Present Illness 55yo female s/p thoracolumbar laminectomies T11-T12, T12-L1, L1-2, and R knee injection performed in the OR 10/03/20. PMH alcoholism, OA, bipolar, cardiomegaly, CHF, chronic respiratory failure, CKD, DM, HLD, HTN, incomplete L BBB, neuropathy, multiple lumbar and cervical surgeries, L rotator cuff repair   Clinical Impression   PTA, pt was living alone and was performing ADLs and IADLs. Pt currently requiring Max A for LB ADLs and Min A for sit<>stand transfer. Pt limited by significant, sensitive pain at RLE requiring increased time throughout. Pt very motivated for dc to home. Pt would benefit from further acute OT to facilitate safe dc. Recommend dc to home with HHOT for further OT to optimize safety, independence with ADLs, and return to PLOF.     Follow Up Recommendations  Home health OT (Pending progress and pain management, may need SNF rehab)    Equipment Recommendations  None recommended by OT    Recommendations for Other Services       Precautions / Restrictions Precautions Precautions: Back Precaution Booklet Issued: No Precaution Comments: Reviewed back precautions nad pt verbalizing them stating "I have had several back surgeries." No brace needed per order set, hyperalgesic R LE Restrictions Weight Bearing Restrictions: No      Mobility Bed Mobility Overal bed mobility: Needs Assistance Bed Mobility: Rolling;Sidelying to Sit;Sit to Sidelying Rolling: Supervision Sidelying to sit: Supervision;HOB elevated     Sit to sidelying: Min assist General bed mobility comments: S for rolling side to side with use of rails and HOB mildly elevated, able to get to sitting at EOB with S and extra time; did need MinA to bring R LE back into bed with return to sidelying    Transfers Overall transfer level: Needs  assistance Equipment used: Rolling walker (2 wheeled) Transfers: Sit to/from Stand Sit to Stand: Min assist         General transfer comment: MinA of +2 to boost to upright standing, also needed multiple attempts and rocking    Balance Overall balance assessment: Needs assistance Sitting-balance support: Bilateral upper extremity supported;Feet supported Sitting balance-Leahy Scale: Good     Standing balance support: Bilateral upper extremity supported;During functional activity Standing balance-Leahy Scale: Poor Standing balance comment: reliant on BUE support/external assist                           ADL either performed or assessed with clinical judgement   ADL Overall ADL's : Needs assistance/impaired Eating/Feeding: Set up;Bed level   Grooming: Set up;Bed level   Upper Body Bathing: Supervision/ safety;Set up;Sitting   Lower Body Bathing: Maximal assistance;Sit to/from stand   Upper Body Dressing : Supervision/safety;Set up;Sitting   Lower Body Dressing: Maximal assistance;Sit to/from stand Lower Body Dressing Details (indicate cue type and reason): don socks Toilet Transfer: Minimal assistance;+2 for safety/equipment;Ambulation;BSC Toilet Transfer Details (indicate cue type and reason): sit<>stand at EOB         Functional mobility during ADLs: Minimal assistance;+2 for safety/equipment;Rolling walker (sit<>stand at EOB) General ADL Comments: Poor activity tolerance due to pain     Vision         Perception     Praxis      Pertinent Vitals/Pain Pain Assessment: Faces Faces Pain Scale: Hurts worst Pain Location: R knee/leg Pain Descriptors / Indicators: Numbness;Pins and needles;Tingling;Stabbing;Sharp Pain Intervention(s): Monitored during session;Limited activity within patient's  tolerance;Repositioned     Hand Dominance Right   Extremity/Trunk Assessment Upper Extremity Assessment Upper Extremity Assessment: RUE deficits/detail;LUE  deficits/detail RUE Deficits / Details: Reports limited strength and ROM stating she is planning on a shoulder replacement. Not formally tested due to RLE pain LUE Deficits / Details: Reports limited strength and ROM stating she has had a shoulder replacement prior. Not formally tested due to RLE pain   Lower Extremity Assessment Lower Extremity Assessment: Defer to PT evaluation   Cervical / Trunk Assessment Cervical / Trunk Assessment: Kyphotic;Other exceptions Cervical / Trunk Exceptions: s/p thoracolumbar surgery   Communication Communication Communication: No difficulties   Cognition Arousal/Alertness: Awake/alert Behavior During Therapy: WFL for tasks assessed/performed;Anxious Overall Cognitive Status: Within Functional Limits for tasks assessed                                 General Comments: tends to perseverate on pain   General Comments  seems to have some possible hyperalgesia R knee, also perseverates on pain. R knee swollen but not warm to touch, no spasms identified in quad, hamstring, or gastroc/soleus. Quad 3-/5 likely pain inhibited. Educated on strategies to help reduce hyperalgesia (rubbing knee with hands and washcloth)    Exercises     Shoulder Instructions      Home Living Family/patient expects to be discharged to:: Private residence Living Arrangements: Alone Available Help at Discharge: Family;Available PRN/intermittently (Son) Type of Home: House Home Access: Stairs to enter [employer] of Steps: 4 steps onto porch L ascending rail Entrance Stairs-Rails: Left Home Layout: One level     Bathroom Shower/Tub: Teacher, early years/pre: Handicapped height     Home Equipment: Grab bars - tub/shower;Hand held shower head;Bedside commode;Tub bench;Walker - 4 wheels   Additional Comments: no longer has aide; children can help intermittently      Prior Functioning/Environment Level of Independence: Independent with  assistive device(s)        Comments: Performs ADLs and IADLs. Pt's Lucianne Lei broke down recently, has been using handicap Lucianne Lei for transportation        OT Problem List: Decreased strength;Decreased range of motion;Decreased activity tolerance;Impaired balance (sitting and/or standing);Decreased knowledge of use of DME or AE;Decreased knowledge of precautions;Pain      OT Treatment/Interventions: Self-care/ADL training;Therapeutic exercise;Energy conservation;DME and/or AE instruction;Therapeutic activities;Balance training;Patient/family education    OT Goals(Current goals can be found in the care plan section) Acute Rehab OT Goals Patient Stated Goal: less pain OT Goal Formulation: With patient Time For Goal Achievement: 10/19/20 Potential to Achieve Goals: Good  OT Frequency: Min 2X/week   Barriers to D/C:            Co-evaluation              AM-PAC OT "6 Clicks" Daily Activity     Outcome Measure Help from another person eating meals?: None Help from another person taking care of personal grooming?: A Little Help from another person toileting, which includes using toliet, bedpan, or urinal?: A Lot Help from another person bathing (including washing, rinsing, drying)?: A Lot Help from another person to put on and taking off regular upper body clothing?: A Little Help from another person to put on and taking off regular lower body clothing?: A Lot 6 Click Score: 16   End of Session Equipment Utilized During Treatment: Rolling walker Nurse Communication: Mobility status  Activity Tolerance: Patient limited by pain Patient left:  in bed;with call bell/phone within reach;with bed alarm set  OT Visit Diagnosis: Unsteadiness on feet (R26.81);Other abnormalities of gait and mobility (R26.89);Muscle weakness (generalized) (M62.81);Pain Pain - Right/Left: Right Pain - part of body: Knee                Time: 8250-5397 OT Time Calculation (min): 50 min Charges:  OT General  Charges $OT Visit: 1 Visit OT Evaluation $OT Eval Moderate Complexity: [place], OTR/L Acute Rehab Pager: [phone] Office: [place] 10/05/2020, 1:28 PM

## 2020-10-05 NOTE — Evaluation (Signed)
Physical Therapy Evaluation Patient Details Name: [name] MRN: [id number] DOB: August 09, 1965 Today's Date: 10/05/2020   History of Present Illness  55yo female s/p thoracolumbar laminectomies T11-T12, T12-L1, L1-2, and R knee injection performed in the OR 10/03/20. PMH alcoholism, OA, bipolar, cardiomegaly, CHF, chronic respiratory failure, CKD, DM, HLD, HTN, incomplete L BBB, neuropathy, multiple lumbar and cervical surgeries, L rotator cuff repair  Clinical Impression   Patient received in bed, pleasant but very anxious regarding her pain. Per MD, MRI clear and OK for PT/OT today. Briefly examined knee (pain limited)- swollen but not inflamed, not hot to touch, no spasms noted in quads/hamstrings/gastroc/soleus that would explain pain. Able to get to EOB and stand today but perseverated on pain, offloads right side considerably when standing. Deferred further transfer training or gait today due to pain. Left in bed positioned to comfort, educated on tactile exercises to help reduce hyperalgesia R LE. Will continue efforts.     Follow Up Recommendations Home health PT;Supervision for mobility/OOB;Other (comment) (if she does not make progress in a reasonable amount of time, must consider SNF)    Equipment Recommendations  Rolling walker with 5" wheels;3in1 (PT);Wheelchair (measurements PT);Wheelchair cushion (measurements PT) (bariatric equipment)    Recommendations for Other Services       Precautions / Restrictions Precautions Precautions: Back Precaution Booklet Issued: No Precaution Comments: no brace needed per order set, hyperalgesic R LE Restrictions Weight Bearing Restrictions: No      Mobility  Bed Mobility Overal bed mobility: Needs Assistance Bed Mobility: Rolling;Sidelying to Sit;Sit to Sidelying Rolling: Supervision Sidelying to sit: Supervision;HOB elevated     Sit to sidelying: Min assist General bed mobility comments: S for rolling side to side with use of  rails and HOB mildly elevated, able to get to sitting at EOB with S and extra time; did need MinA to bring R LE back into bed with return to sidelying    Transfers Overall transfer level: Needs assistance Equipment used: Rolling walker (2 wheeled) Transfers: Sit to/from Stand Sit to Stand: Min assist;Min guard         General transfer comment: min guard of one person, MinA of second person to boost to upright standing, also needed multiple attempts and rocking  Ambulation/Gait             General Gait Details: unable- needed MinAx2 for lateral weight shifting, unable to progress to gait/stand pivots due to pain today  Stairs            Wheelchair Mobility    Modified Rankin (Stroke Patients Only)       Balance Overall balance assessment: Needs assistance Sitting-balance support: Bilateral upper extremity supported;Feet supported Sitting balance-Leahy Scale: Good     Standing balance support: Bilateral upper extremity supported;During functional activity Standing balance-Leahy Scale: Poor Standing balance comment: reliant on BUE support/external assist                             Pertinent Vitals/Pain Pain Assessment: Faces Faces Pain Scale: Hurts worst Pain Location: R knee/leg Pain Descriptors / Indicators: Numbness;Pins and needles;Tingling;Stabbing;Sharp Pain Intervention(s): Limited activity within patient's tolerance;Monitored during session;Premedicated before session    Home Living Family/patient expects to be discharged to:: Private residence Living Arrangements: Alone Available Help at Discharge: Family Type of Home: House Home Access: Stairs to enter Entrance Stairs-Rails: Left Entrance Stairs-Number of Steps: 4 steps onto porch L ascending rail Home Layout: One level Home Equipment: Grab  bars - tub/shower;Hand held shower head;Bedside commode;Tub bench;Walker - 4 wheels Additional Comments: no longer has aide; children can help  intermittently    Prior Function Level of Independence: Independent with assistive device(s)         Comments: Lucianne Lei broke down recently, has been using handicap van for transportation     Hand Dominance        Extremity/Trunk Assessment   Upper Extremity Assessment Upper Extremity Assessment: Defer to OT evaluation    Lower Extremity Assessment Lower Extremity Assessment: Generalized weakness (R LE weaker than left with quad 3-/5, likely pain inhibited)    Cervical / Trunk Assessment Cervical / Trunk Assessment: Kyphotic;Other exceptions Cervical / Trunk Exceptions: s/p thoracolumbar surgery  Communication   Communication: No difficulties  Cognition Arousal/Alertness: Awake/alert Behavior During Therapy: WFL for tasks assessed/performed;Anxious Overall Cognitive Status: Within Functional Limits for tasks assessed                                 General Comments: tends to perseverate on pain      General Comments General comments (skin integrity, edema, etc.): seems to have some possible hyperalgesia R knee, also perseverates on pain. R knee swollen but not warm to touch, no spasms identified in quad, hamstring, or gastroc/soleus. Quad 3-/5 likely pain inhibited. Educated on strategies to help reduce hyperalgesia (rubbing knee with hands and washcloth)    Exercises     Assessment/Plan    PT Assessment Patient needs continued PT services  PT Problem List Decreased strength;Decreased range of motion;Obesity;Decreased activity tolerance;Decreased safety awareness;Decreased balance;Decreased mobility;Decreased coordination;Pain       PT Treatment Interventions DME instruction;Balance training;Gait training;Stair training;Functional mobility training;Neuromuscular re-education;Patient/family education;Therapeutic activities;Therapeutic exercise;Wheelchair mobility training;Manual techniques    PT Goals (Current goals can be found in the Care Plan section)   Acute Rehab PT Goals Patient Stated Goal: less pain PT Goal Formulation: With patient Time For Goal Achievement: 10/19/20 Potential to Achieve Goals: Fair    Frequency Min 5X/week   Barriers to discharge        Co-evaluation               AM-PAC PT "6 Clicks" Mobility  Outcome Measure Help needed turning from your back to your side while in a flat bed without using bedrails?: A Little Help needed moving from lying on your back to sitting on the side of a flat bed without using bedrails?: A Lot Help needed moving to and from a bed to a chair (including a wheelchair)?: Total Help needed standing up from a chair using your arms (e.g., wheelchair or bedside chair)?: A Little Help needed to walk in hospital room?: Total Help needed climbing 3-5 steps with a railing? : Total 6 Click Score: 11    End of Session   Activity Tolerance: Patient limited by pain Patient left: in bed;with call bell/phone within reach Nurse Communication: Mobility status PT Visit Diagnosis: Muscle weakness (generalized) (M62.81);Unsteadiness on feet (R26.81);Difficulty in walking, not elsewhere classified (R26.2);Pain Pain - Right/Left: Right Pain - part of body: Knee    Time: 9024-0973 PT Time Calculation (min) (ACUTE ONLY): 43 min   Charges:   PT Evaluation $PT Eval Moderate Complexity: 1 Mod (co-eval with OT) PT Treatments $Therapeutic Activity: 8-22 mins       Windell [place], DPT, PN2   Supplemental Physical Therapist [place]    Pager [phone] Acute Rehab Office [phone]

## 2020-10-06 LAB — GLUCOSE, CAPILLARY
Glucose-Capillary: 126 mg/dL — ABNORMAL HIGH (ref 70–99)
Glucose-Capillary: 139 mg/dL — ABNORMAL HIGH (ref 70–99)
Glucose-Capillary: 86 mg/dL (ref 70–99)

## 2020-10-06 NOTE — TOC Initial Note (Addendum)
Transition of Care [facility]) - Initial/Assessment Note    Patient Details  Name: [name] MRN: [id number] Date of Birth: 03/04/1965  [facility] CM/SW Contact:    Marilu Favre, RN Phone Number: 10/06/2020, 1:05 PM  Clinical Narrative:                 Spoke to patient at bedside . Patient does not want SNF placement. States she lives with her son and when he is at work her ex husband can assist.   Patient has had [facility] in the past and would like them again. NCM called Kenzie with [facility] and awaiting call back.Kenzie unable to accept. Patient has no preference. Cory with [facility] accepted referral  Patient already has walker at home, declined 3 in 1, PT recommended wheel chair, ordered wheel chair with [facility].  Expected Discharge Plan: [place]     Patient Goals and CMS Choice Patient states their goals for this hospitalization and ongoing recovery are:: to go home CMS Medicare.gov Compare Post Acute Care list provided to:: Patient Choice offered to / list presented to : Patient  Expected Discharge Plan and Services Expected Discharge Plan: [place]   Discharge Planning Services: CM Consult Post Acute Care Choice: Home Health, Durable Medical Equipment Living arrangements for the past 2 months: Single Family Home                 DME Arranged: Wheelchair manual DME Agency: [employer] Date DME Agency Contacted: 10/06/20 Time DME Agency Contacted: [id number] Representative spoke with at DME Agency: Freda Munro HH Arranged: PT          Prior Living Arrangements/Services Living arrangements for the past 2 months: [place] Lives with:: Adult Children Patient language and need for interpreter reviewed:: Yes Do you feel safe going back to the place where you live?: Yes      Need for Family Participation in Patient Care: Yes (Comment) Care giver support system in place?: Yes (comment) Current home  services: DME Criminal Activity/Legal Involvement Pertinent to Current Situation/Hospitalization: No - Comment as needed  Activities of Daily Living      Permission Sought/Granted   Permission granted to share information with : No              Emotional Assessment Appearance:: Appears stated age Attitude/Demeanor/Rapport: Engaged Affect (typically observed): Accepting Orientation: : Oriented to Self, Oriented to Place, Oriented to  Time, Oriented to Situation Alcohol / Substance Use: Not Applicable Psych Involvement: No (comment)  Admission diagnosis:  Status post lumbar laminectomy [Z98.890] Patient Active Problem List   Diagnosis Date Noted   Status post lumbar laminectomy 10/03/2020   Cubital tunnel syndrome on left 05/02/2020   Vaginal discharge 01/20/2020   Prolapse of anterior vaginal wall 11/10/2019   Skin ulcer of left thigh, limited to breakdown of skin ([place]) 09/09/2019   Ulnar nerve compression 08/06/2019   Diabetic neuropathy associated with type 2 diabetes mellitus ([place]) 08/06/2019   Body mass index 50.0-59.9, adult ([place]) 06/12/2019   Impingement syndrome of left shoulder 05/07/2019   Polyp of colon    Polyp of ascending colon    Rectal discomfort 02/12/2019   Osteoarthritis of right hip 02/12/2019   Nontraumatic tear of left supraspinatus tendon 12/30/2018   Gout 12/17/2018   Nontraumatic complete tear of right rotator cuff 12/08/2018   Rotator cuff tear, right 12/01/2018   Tobacco abuse 08/21/2018   Right groin pain  07/20/2018   Depressed mood 07/13/2018   Paroxysmal SVT (supraventricular tachycardia) (HCC) 03/20/2018   Pure hypercholesterolemia 03/20/2018   Muscle cramping 02/25/2018   Nodule of upper lobe of right lung 02/13/2018   Falls, subsequent encounter 01/30/2018   Pain in left foot 11/04/2017   Essential hypertension 10/27/2017   Solitary pulmonary nodule 10/07/2017   Restrictive lung disease secondary to obesity 10/01/2017    Polycythemia, secondary 10/01/2017   Chronic viral hepatitis B without delta-agent ([facility]) 07/08/2017   COPD (chronic obstructive pulmonary disease) with chronic bronchitis ([place]) 06/27/2017   Chronic kidney disease (CKD), stage IV (severe) ([place]) 01/14/2017   Type 2 diabetes mellitus with stage 3 chronic kidney disease, with long-term current use of insulin ([place]) 12/12/2016   Urge incontinence of urine 12/05/2016   Trigger finger, left index finger 07/15/2016   Back pain 04/07/2014   Morbid obesity ([place]) 10/02/2012   Chronic diastolic CHF (congestive heart failure) ([place]) 04/07/2012   GERD 01/26/2010   Obstructive sleep apnea treated with BiPAP 02/03/2008   FIBROCYSTIC BREAST DISEASE 10/28/2006   Hyperlipidemia 03/27/2006   Obesity hypoventilation syndrome ([place]) 03/27/2006   Depression with anxiety 03/27/2006   PCP:  [name], MD Pharmacy:   [id number] - [place], [place] [address] [place] [address] Phone: [phone] Fax: [phone]     Social Determinants of Health (SDOH) Interventions    Readmission Risk Interventions No flowsheet data found.

## 2020-10-06 NOTE — TOC Initial Note (Signed)
Transition of Care [facility]) - Initial/Assessment Note    Patient Details  Name: [name] MRN: [id number] Date of Birth: December 08, 1965  Transition of Care [facility] CM/SW Contact:    Milinda Antis, [place] Phone Number: 10/06/2020, 11:48 AM  Clinical Narrative:                  CSW received consult for possible SNF placement at time of discharge. CSW spoke with patient at bedside.  The patient informed CSW that she does not want to go to a SNF because she "does not trust em (them)".  CSW discussed PT's recommendations and the reasons that SNF placement could be beneficial.  The patient pleasantly declined SNF reporting that she would go home in pain before going to a SNF.  Patient stated that she will have her ex husband Ellene Route come to the home and assist her until she is better. Patient reports preference for home with home health as she had this before after a previous surgery.  She did not express an agency choice , but did request a reacher. CSW discussed insurance authorization process. Patient has received  [place] vaccines.          Patient Goals and CMS Choice        Expected Discharge Plan and Services                                                Prior Living Arrangements/Services                       Activities of Daily Living      Permission Sought/Granted                  Emotional Assessment              Admission diagnosis:  Status post lumbar laminectomy [Z98.890] Patient Active Problem List   Diagnosis Date Noted   Status post lumbar laminectomy 10/03/2020   Cubital tunnel syndrome on left 05/02/2020   Vaginal discharge 01/20/2020   Prolapse of anterior vaginal wall 11/10/2019   Skin ulcer of left thigh, limited to breakdown of skin ([place]) 09/09/2019   Ulnar nerve compression 08/06/2019   Diabetic neuropathy associated with type 2 diabetes mellitus ([place]) 08/06/2019   Body mass index 50.0-59.9, adult ([place])  06/12/2019   Impingement syndrome of left shoulder 05/07/2019   Polyp of colon    Polyp of ascending colon    Rectal discomfort 02/12/2019   Osteoarthritis of right hip 02/12/2019   Nontraumatic tear of left supraspinatus tendon 12/30/2018   Gout 12/17/2018   Nontraumatic complete tear of right rotator cuff 12/08/2018   Rotator cuff tear, right 12/01/2018   Tobacco abuse 08/21/2018   Right groin pain 07/20/2018   Depressed mood 07/13/2018   Paroxysmal SVT (supraventricular tachycardia) ([place]) 03/20/2018   Pure hypercholesterolemia 03/20/2018   Muscle cramping 02/25/2018   Nodule of upper lobe of right lung 02/13/2018   Falls, subsequent encounter 01/30/2018   Pain in left foot 11/04/2017   Essential hypertension 10/27/2017   Solitary pulmonary nodule 10/07/2017   Restrictive lung disease secondary to obesity 10/01/2017   Polycythemia, secondary 10/01/2017   Chronic viral hepatitis B without delta-agent ([place]) 07/08/2017   COPD (chronic obstructive pulmonary disease) with chronic bronchitis ([place]) 06/27/2017   Chronic kidney  disease (CKD), stage IV (severe) ([place]) 01/14/2017   Type 2 diabetes mellitus with stage 3 chronic kidney disease, with long-term current use of insulin ([place]) 12/12/2016   Urge incontinence of urine 12/05/2016   Trigger finger, left index finger 07/15/2016   Back pain 04/07/2014   Morbid obesity ([place]) 10/02/2012   Chronic diastolic CHF (congestive heart failure) ([place]) 04/07/2012   GERD 01/26/2010   Obstructive sleep apnea treated with BiPAP 02/03/2008   FIBROCYSTIC BREAST DISEASE 10/28/2006   Hyperlipidemia 03/27/2006   Obesity hypoventilation syndrome ([place]) 03/27/2006   Depression with anxiety 03/27/2006   PCP:  [name], MD Pharmacy:   [id number] - [place], [place] [address] [place] [address] Phone: [phone] Fax: [phone]     Social Determinants of  Health (SDOH) Interventions    Readmission Risk Interventions No flowsheet data found.

## 2020-10-06 NOTE — Progress Notes (Signed)
     Subjective: 3 Days Post-Op Procedure(s) (LRB): THORACOLUMBAR LAMINECTOMIES THORACIC ELEVEN-TWELVE, THORACIC TWELVE-LUMBAR ONE  AND LUMBAT ONE-TWO (N/A) KNEE INJECTION (Right) Awake, alert and oriented x 4. PT OT assisted to standing yesterday. Today she was able to stand and take 2-3 steps to sit in the bedside recliner. Reports she is voiding often and in large amounts with accidents in bed. No BM. Still with burning dysesthesias right thigh and knee. No fever or warmth.  Right leg feels week.   Patient reports pain as moderate.    Objective:   VITALS:  Temp:  [98.1 F (36.7 C)-98.7 F (37.1 C)] 98.1 F (36.7 C) (09/09 0746) Pulse Rate:  [80-89] 86 (09/09 0746) Resp:  [15-17] 17 (09/09 0746) BP: (120-151)/(59-76) 151/76 (09/09 0746) SpO2:  [93 %-100 %] 94 % (09/09 0746)  Neurologically intact ABD soft Neurovascular intact Sensation intact distally Intact pulses distally Dorsiflexion/Plantar flexion intact Incision: dressing C/D/I, no drainage, and new dressing applied.   LABS Recent Labs    10/04/20 0052  HGB 13.1  WBC 15.5*  PLT 275   Recent Labs    10/04/20 0052  NA 133*  K 2.9*  CL 97*  CO2 24  BUN 13  CREATININE 1.26*  GLUCOSE 283*   No results for input(s): LABPT, INR in the last 72 hours.   Assessment/Plan: 3 Days Post-Op Procedure(s) (LRB): THORACOLUMBAR LAMINECTOMIES THORACIC ELEVEN-TWELVE, THORACIC TWELVE-LUMBAR ONE  AND LUMBAT ONE-TWO (N/A) KNEE INJECTION (Right) Low potassium, recheck BM on KCL supplements.   Advance diet Up with therapy D/C IV fluids Discussed SNF placement and she is in absolute disagreement with this, would prefer HHN at home PT.  Only able to get from bed to bedside chair so far and needs to be more mobile before safe to send home. Keep for PT/OT and likely discharge home early next week Monday or Tuesday. Partners will see over weekend, office coverage    [name] 10/06/2020, 2:18 PM Patient ID: [name], female   DOB: 15-Nov-1965, 55 y.o.   MRN: [id number]

## 2020-10-06 NOTE — Progress Notes (Signed)
Physical Therapy Treatment Patient Details Name: [name] MRN: [id number] DOB: January 02, 1966 Today's Date: 10/06/2020    History of Present Illness 55yo female s/p thoracolumbar laminectomies T11-T12, T12-L1, L1-2, and R knee injection performed in the OR 10/03/20. PMH alcoholism, OA, bipolar, cardiomegaly, CHF, chronic respiratory failure, CKD, DM, HLD, HTN, incomplete L BBB, neuropathy, multiple lumbar and cervical surgeries, L rotator cuff repair    PT Comments    Patient received in bed, pleasant and cooperative and feeling much better than yesterday. Able to get to EOB and pivot to recliner with MinA of 1-2 people, politely declines gait training today however. Once PT had left room, she called out for help getting to bariatric BSC, and NT and PT assisted her over to it. Left up on [facility] with all needs met, nursing staff aware of pt status. Refusing SNF. Will continue efforts so that she may return home safely.    Follow Up Recommendations  Home health PT;Supervision for mobility/OOB;Other (comment) (if she does not make progress in a reasonable amount of time, must consider SNF)     Equipment Recommendations  Rolling walker with 5" wheels;3in1 (PT);Wheelchair (measurements PT);Wheelchair cushion (measurements PT) (bariatric equipment)    Recommendations for Other Services       Precautions / Restrictions Precautions Precautions: Back Precaution Booklet Issued: No Precaution Comments: Reviewed back precautions nad pt verbalizing them stating "I have had several back surgeries." No brace needed per order set, hyperalgesic R LE Restrictions Weight Bearing Restrictions: No    Mobility  Bed Mobility Overal bed mobility: Needs Assistance Bed Mobility: Rolling;Sidelying to Sit Rolling: Supervision Sidelying to sit: Supervision;HOB elevated       General bed mobility comments: S, increased time and occasional cues for technique/sequencing    Transfers Overall transfer level:  Needs assistance Equipment used: Rolling walker (2 wheeled) Transfers: Sit to/from [facility] Sit to Stand: Min assist Stand pivot transfers: Min assist;+2 physical assistance       General transfer comment: MinA to boost to standing with RW, MinA x2 for safety with pivot transfers; noted mild buckling R knee but pt able to prevent full buckling with BUE support  Ambulation/Gait             General Gait Details: declined today   Marine scientist Rankin (Stroke Patients Only)       Balance Overall balance assessment: Needs assistance Sitting-balance support: Bilateral upper extremity supported;Feet supported Sitting balance-Leahy Scale: Good     Standing balance support: Bilateral upper extremity supported;During functional activity Standing balance-Leahy Scale: Poor Standing balance comment: reliant on BUE support/external assist                            Cognition Arousal/Alertness: Awake/alert Behavior During Therapy: WFL for tasks assessed/performed Overall Cognitive Status: Within Functional Limits for tasks assessed                                        Exercises      General Comments        Pertinent Vitals/Pain Pain Assessment: Faces Faces Pain Scale: Hurts a little bit Pain Location: R knee/leg Pain Descriptors / Indicators: Numbness;Pins and needles;Tingling;Stabbing;Sharp Pain Intervention(s): Limited activity within patient's tolerance;Monitored during session;Repositioned    Home Living  Prior Function            PT Goals (current goals can now be found in the care plan section) Acute Rehab PT Goals Patient Stated Goal: less pain PT Goal Formulation: With patient Time For Goal Achievement: 10/19/20 Potential to Achieve Goals: Fair Progress towards PT goals: Progressing toward goals    Frequency    Min  5X/week      PT Plan Current plan remains appropriate    Co-evaluation              AM-PAC PT "6 Clicks" Mobility   Outcome Measure  Help needed turning from your back to your side while in a flat bed without using bedrails?: A Little Help needed moving from lying on your back to sitting on the side of a flat bed without using bedrails?: A Little Help needed moving to and from a bed to a chair (including a wheelchair)?: A Lot Help needed standing up from a chair using your arms (e.g., wheelchair or bedside chair)?: A Little Help needed to walk in hospital room?: A Lot Help needed climbing 3-5 steps with a railing? : Total 6 Click Score: 14    End of Session Equipment Utilized During Treatment: Gait belt Activity Tolerance: Patient tolerated treatment well Patient left: in chair;with call bell/phone within reach;with chair alarm set Nurse Communication: Mobility status PT Visit Diagnosis: Muscle weakness (generalized) (M62.81);Unsteadiness on feet (R26.81);Difficulty in walking, not elsewhere classified (R26.2);Pain Pain - Right/Left: Right Pain - part of body: Knee     Time: 1346-1410 PT Time Calculation (min) (ACUTE ONLY): 24 min  Charges:  $Therapeutic Activity: 23-37 mins                     Windell Norfolk, DPT, PN2   Supplemental Physical Therapist [place]    Pager [phone] Acute Rehab Office [phone]

## 2020-10-06 NOTE — Care Management (Cosign Needed)
    Durable Medical Equipment  (From admission, onward)           Start     Ordered   10/06/20 1303  For home use only DME standard manual wheelchair with seat cushion  Once       Comments: Patient suffers from THORACOLUMBAR LAMINECTOMIES THORACIC ELEVEN-TWELVE, THORACIC TWELVE-LUMBAR ONE  AND LUMBAT ONE-TWO (N/A) KNEE INJECTION (Right) which impairs their ability to perform daily activities like ambulating  in the home.  A cane  will not resolve issue with performing activities of daily living. A wheelchair will allow patient to safely perform daily activities. Patient can safely propel the wheelchair in the home or has a caregiver who can provide assistance. Length of need lifetime. Accessories: elevating leg rests (ELRs), wheel locks, extensions and anti-tippers.  Seat and back cushions   10/06/20 1303   10/03/20 1509  DME Walker rolling  Once       Question Answer Comment  Walker: With 5 Inch Wheels   Patient needs a walker to treat with the following condition Spinal stenosis of lumbosacral region   Patient needs a walker to treat with the following condition Spinal stenosis of lumbar region with neurogenic claudication      10/03/20 1508   10/03/20 1509  DME 3 n 1  Once        10/03/20 1508

## 2020-10-07 LAB — BASIC METABOLIC PANEL
Anion gap: 8 (ref 5–15)
BUN: 16 mg/dL (ref 6–20)
CO2: 30 mmol/L (ref 22–32)
Calcium: 9.3 mg/dL (ref 8.9–10.3)
Chloride: 100 mmol/L (ref 98–111)
Creatinine, Ser: 0.89 mg/dL (ref 0.44–1.00)
GFR, Estimated: >60 mL/min (ref >60)
Glucose, Bld: 128 mg/dL — ABNORMAL HIGH (ref 70–99)
Potassium: 4.1 mmol/L (ref 3.5–5.1)
Sodium: 138 mmol/L (ref 135–145)

## 2020-10-07 LAB — CBC WITH DIFFERENTIAL/PLATELET
Abs Immature Granulocytes: 0.11 10*3/uL — ABNORMAL HIGH (ref 0.00–0.07)
Basophils Absolute: 0 10*3/uL (ref 0.0–0.1)
Basophils Relative: 0 %
Eosinophils Absolute: 0.4 10*3/uL (ref 0.0–0.5)
Eosinophils Relative: 3 %
HCT: 38.7 % (ref 36.0–46.0)
Hemoglobin: 13.3 g/dL (ref 12.0–15.0)
Immature Granulocytes: 1 %
Lymphocytes Relative: 18 %
Lymphs Abs: 2.2 10*3/uL (ref 0.7–4.0)
MCH: 28.3 pg (ref 26.0–34.0)
MCHC: 34.4 g/dL (ref 30.0–36.0)
MCV: 82.3 fL (ref 80.0–100.0)
Monocytes Absolute: 1.3 10*3/uL — ABNORMAL HIGH (ref 0.1–1.0)
Monocytes Relative: 10 %
Neutro Abs: 8.3 10*3/uL — ABNORMAL HIGH (ref 1.7–7.7)
Neutrophils Relative %: 68 %
Platelets: 256 10*3/uL (ref 150–400)
RBC: 4.7 MIL/uL (ref 3.87–5.11)
RDW: 13.5 % (ref 11.5–15.5)
WBC: 12.3 10*3/uL — ABNORMAL HIGH (ref 4.0–10.5)
nRBC: 0 % (ref 0.0–0.2)

## 2020-10-07 LAB — GLUCOSE, CAPILLARY
Glucose-Capillary: 131 mg/dL — ABNORMAL HIGH (ref 70–99)
Glucose-Capillary: 117 mg/dL — ABNORMAL HIGH (ref 70–99)
Glucose-Capillary: 119 mg/dL — ABNORMAL HIGH (ref 70–99)
Glucose-Capillary: 114 mg/dL — ABNORMAL HIGH (ref 70–99)

## 2020-10-07 NOTE — Progress Notes (Signed)
Patient ID: [name], female   DOB: [birth year], 55 y.o.   MRN: [id number] No acute changes over the past 24 hours.  She still reports the same radicular pain.  She has been very slow to mobilize with therapy.  She does not want to go to skilled nursing at all.  I did assess her back incision.  The staples are intact and there is no drainage and a new Mepilex dressing was placed.  She is moving her feet and her toes.

## 2020-10-07 NOTE — Progress Notes (Signed)
Physical Therapy Treatment Patient Details Name: [name] MRN: [id number] DOB: 1965-04-14 Today's Date: 10/07/2020    History of Present Illness 55yo female s/p thoracolumbar laminectomies T11-T12, T12-L1, L1-2, and R knee injection performed in the OR 10/03/20. PMH alcoholism, OA, bipolar, cardiomegaly, CHF, chronic respiratory failure, CKD, DM, HLD, HTN, incomplete L BBB, neuropathy, multiple lumbar and cervical surgeries, L rotator cuff repair    PT Comments    Pt supine in bed on arrival.  Pt required max cues for encouragement to participate in PT session.  She was able to manage two short bouts of gt training this session.  Continue to recommend HHPT with DME listed below.    Follow Up Recommendations  Home health PT;Supervision for mobility/OOB     Equipment Recommendations  Rolling walker with 5" wheels;3in1 (PT);Wheelchair (measurements PT);Wheelchair cushion (measurements PT) (equipment will need to be bariatric)    Recommendations for Other Services       Precautions / Restrictions Precautions Precautions: Back Precaution Booklet Issued: No Restrictions Weight Bearing Restrictions: No    Mobility  Bed Mobility Overal bed mobility: Needs Assistance Bed Mobility: Rolling;Sidelying to Sit;Sit to Sidelying Rolling: Supervision Sidelying to sit: Supervision     Sit to sidelying: Supervision General bed mobility comments: S, increased time and occasional cues for technique/sequencing    Transfers Overall transfer level: Needs assistance Equipment used: Rolling walker (2 wheeled) Transfers: Sit to/from Stand Sit to Stand: Min guard;Min assist         General transfer comment: Min guard from elevated bed and min a from commode.  Cues for hand placement.  Utilized rocking momentum to achieve standing.  Ambulation/Gait Ambulation/Gait assistance: Min guard Gait Distance (Feet): 4 Feet (x2)   Gait Pattern/deviations: Wide base of support;Trunk  flexed;Shuffle     General Gait Details: Performed with commode away from bed to and from.  Pt required min guard for safety and cues to keep device close to her body.   Stairs             Wheelchair Mobility    Modified Rankin (Stroke Patients Only)       Balance Overall balance assessment: Needs assistance Sitting-balance support: Bilateral upper extremity supported;Feet supported Sitting balance-Leahy Scale: Good       Standing balance-Leahy Scale: Poor                              Cognition Arousal/Alertness: Awake/alert Behavior During Therapy: WFL for tasks assessed/performed Overall Cognitive Status: Within Functional Limits for tasks assessed                                 General Comments: tends to perseverate on pain      Exercises      General Comments        Pertinent Vitals/Pain Pain Assessment: 0-10 Pain Score: 10-Worst pain ever Pain Location: R knee/leg Pain Descriptors / Indicators: Numbness;Pins and needles;Tingling;Stabbing;Sharp Pain Intervention(s): Monitored during session;Repositioned    Home Living                      Prior Function            PT Goals (current goals can now be found in the care plan section) Acute Rehab PT Goals Patient Stated Goal: less pain Potential to Achieve Goals: Fair Progress towards PT goals: Progressing toward goals  Frequency    Min 5X/week      PT Plan Current plan remains appropriate    Co-evaluation              AM-PAC PT "6 Clicks" Mobility   Outcome Measure  Help needed turning from your back to your side while in a flat bed without using bedrails?: A Little Help needed moving from lying on your back to sitting on the side of a flat bed without using bedrails?: A Little Help needed moving to and from a bed to a chair (including a wheelchair)?: A Little Help needed standing up from a chair using your arms (e.g., wheelchair or bedside  chair)?: A Little Help needed to walk in hospital room?: A Little Help needed climbing 3-5 steps with a railing? : A Lot 6 Click Score: 17    End of Session Equipment Utilized During Treatment: Gait belt Activity Tolerance: Patient tolerated treatment well Patient left: in chair;with call bell/phone within reach;with chair alarm set Nurse Communication: Mobility status PT Visit Diagnosis: Muscle weakness (generalized) (M62.81);Unsteadiness on feet (R26.81);Difficulty in walking, not elsewhere classified (R26.2);Pain Pain - Right/Left: Right Pain - part of body: Knee     Time: 7078-6754 PT Time Calculation (min) (ACUTE ONLY): 19 min  Charges:  $Therapeutic Activity: 8-22 mins                     Erasmo Leventhal , PTA Acute Rehabilitation Services Pager [phone] Office [phone]    [address] Eli Hose 10/07/2020, 4:35 PM

## 2020-10-08 LAB — GLUCOSE, CAPILLARY
Glucose-Capillary: 136 mg/dL — ABNORMAL HIGH (ref 70–99)
Glucose-Capillary: 137 mg/dL — ABNORMAL HIGH (ref 70–99)
Glucose-Capillary: 107 mg/dL — ABNORMAL HIGH (ref 70–99)
Glucose-Capillary: 126 mg/dL — ABNORMAL HIGH (ref 70–99)
Glucose-Capillary: 109 mg/dL — ABNORMAL HIGH (ref 70–99)
Glucose-Capillary: 213 mg/dL — ABNORMAL HIGH (ref 70–99)

## 2020-10-08 MED ORDER — MAGNESIUM HYDROXIDE 400 MG/5ML PO SUSP
30.0000 mL | Freq: Every day | ORAL | Status: DC
  Administered 2020-10-08: 30 mL via ORAL
  Filled 2020-10-08 (×3): qty 30

## 2020-10-08 MED ORDER — MAGNESIUM HYDROXIDE 400 MG/5ML PO SUSP
15.0000 mL | Freq: Every day | ORAL | Status: DC
  Administered 2020-10-08: 15 mL via ORAL
  Filled 2020-10-08: qty 30

## 2020-10-08 NOTE — Progress Notes (Signed)
   Subjective:  No acute events overnight. Resting comfortably this AM.  Pain well controlled.   Objective:   VITALS:   Vitals:   10/07/20 0811 10/07/20 1643 10/07/20 2007 10/08/20 0749  BP: 136/64 117/72 122/73 (!) 148/87  Pulse: 81 84 91 97  Resp: 18 18 16 19   Temp: 98.2 F (36.8 C) 98 F (36.7 C) 98.1 F (36.7 C) 97.9 F (36.6 C)  TempSrc: Oral   Oral  SpO2: 93% 96% 92% 96%  Weight:      Height:        Gen: Resting comfortably Pulm: Normal WOB on RA CV: Normal rate, extremities warm and well perfused BLE: ADF/APF 5/5, EHL/FHL 5/5, SILT su/sa/sp/dp/t distributions    Lab Results  Component Value Date   WBC 12.3 (H) 10/07/2020   HGB 13.3 10/07/2020   HCT 38.7 10/07/2020   MCV 82.3 10/07/2020   PLT 256 10/07/2020     Assessment/Plan:  5 Days Post-Op after THORACOLUMBAR LAMINECTOMIES THORACIC ELEVEN-TWELVE, THORACIC TWELVE-LUMBAR ONE  AND LUMBAT ONE-TWO (N/A) KNEE INJECTION (Right)  - PT still recommending HHPT but not yet cleared for home - Up with PT/OT - Pain control - Discharge planning    Danyetta Gillham 10/08/2020, 10:01 AM [phone]

## 2020-10-08 NOTE — Progress Notes (Signed)
Physical Therapy Treatment Patient Details Name: [name] MRN: [id number] DOB: 09-Feb-1965 Today's Date: 10/08/2020    History of Present Illness 55yo female s/p thoracolumbar laminectomies T11-T12, T12-L1, L1-2, and R knee injection performed in the OR 10/03/20. PMH alcoholism, OA, bipolar, cardiomegaly, CHF, chronic respiratory failure, CKD, DM, HLD, HTN, incomplete L BBB, neuropathy, multiple lumbar and cervical surgeries, L rotator cuff repair    PT Comments    Continuing efforts to work on functional mobility and activity tolerance in anticipation of going home; Made first attempt at PT session earlier this am, and pt asked PT to return closer to noon; Upon return, pt agreed to attempt to get up, but stopped moving just as her R foot and knee cleared EOB due to pain, and she requested to stop OOB efforts;   Max encouragement given, and gave rationale for moving even in some pain, because there may be a moment at home when she MUST get to bathroom, even if/when she is in this much pain; still declined OOB or walking;   We took the opportunity to discuss the plan for dc home; She tells me her ex-husband will assist her at home; Wheelchair had been delivered to her room, but she indicated it will not fit in her home, and she will need to send it back; She plans to use her Rollator RW to sit in and roll around the house if she needs to (briefly told her that use is off-label, and we typically advise against using RW in that way -- she verbalized understanding); We discussed stairs to enter her home (4 steps with L rail) -- she indicated that she does not anticipate difficulty;    Follow Up Recommendations  Home health PT;Supervision for mobility/OOB     Equipment Recommendations  Rolling walker with 5" wheels;Other (comment) Judie Petit)    Recommendations for Other Services       Precautions / Restrictions Precautions Precautions: Back Precaution Comments: Reviewed back precautions and  pt verbalizing them stating "I have had several back surgeries." No brace needed per order set, hyperalgesic R LE    Mobility  Bed Mobility Overal bed mobility: Needs Assistance Bed Mobility: Rolling Rolling: Min assist (and rail use)         General bed mobility comments: Pt initiated rolling to R with rail; able to clear R foot from EOB, but once LLE was moving towards EOB, she stopped; indicated that at that time she is unable to get up to sit and walk in the room; Assisted pt with her electrode placement for e-stim, and then assisted with positioning back in the bed    Transfers                 General transfer comment: Declined  Ambulation/Gait             General Gait Details: declined   Stairs         General stair comments: We briefly discussed stairs to enter her home   Therapist, art Details (indicate cue type and reason): We briefly discussed using the wheelchair, and pt indicated she willl need to send it back as it will not fit in her home  Modified Rankin (Stroke Patients Only)       Balance  Cognition Arousal/Alertness: Awake/alert Behavior During Therapy: WFL for tasks assessed/performed Overall Cognitive Status: Within Functional Limits for tasks assessed                                 General Comments: tends to perseverate on pain; participated in discussion re: what she must do to be able to go home; ultimately said, "I know what my body can do, and my leg isn't going to be ok gatting up right now"      Exercises      General Comments General comments (skin integrity, edema, etc.): Participated in discussion re: managing at home      Pertinent Vitals/Pain Pain Assessment: 0-10 Pain Score: 9  ("My leg is just not ready to move") Faces Pain Scale: Hurts a little bit Pain Location: R knee/leg Pain  Descriptors / Indicators: Numbness;Pins and needles;Tingling;Stabbing;Sharp Pain Intervention(s): Monitored during session;Other (comment);Premedicated before session (pushed PT session to later per pt request; pt was using her own e-stim unit before session)    Home Living                      Prior Function            PT Goals (current goals can now be found in the care plan section) Acute Rehab PT Goals Patient Stated Goal: less pain; reiterates that she wants to go home and will not consider SFN for rehab PT Goal Formulation: With patient Time For Goal Achievement: 10/19/20 Potential to Achieve Goals: Fair Progress towards PT goals: Not progressing toward goals - comment (Limited participation this am due to pain)    Frequency    Min 5X/week      PT Plan Current plan remains appropriate    Co-evaluation              AM-PAC PT "6 Clicks" Mobility   Outcome Measure  Help needed turning from your back to your side while in a flat bed without using bedrails?: A Little Help needed moving from lying on your back to sitting on the side of a flat bed without using bedrails?: A Little Help needed moving to and from a bed to a chair (including a wheelchair)?: A Little Help needed standing up from a chair using your arms (e.g., wheelchair or bedside chair)?: A Little Help needed to walk in hospital room?: A Little Help needed climbing 3-5 steps with a railing? : A Lot 6 Click Score: 17    End of Session   Activity Tolerance: Patient limited by pain   Nurse Communication: Other (comment);Mobility status (Pt has questions re: getting something to help her go to the bathroom) PT Visit Diagnosis: Muscle weakness (generalized) (M62.81);Unsteadiness on feet (R26.81);Difficulty in walking, not elsewhere classified (R26.2);Pain Pain - Right/Left: Right Pain - part of body: Knee     Time: 3016-0109 PT Time Calculation (min) (ACUTE ONLY): 11 min  Charges:  $Self  Care/Home Management: [place], [place] Pager [phone] Office [phone]    [place]ta [place] 10/08/2020, 11:39 AM

## 2020-10-09 LAB — GLUCOSE, CAPILLARY
Glucose-Capillary: 155 mg/dL — ABNORMAL HIGH (ref 70–99)
Glucose-Capillary: 138 mg/dL — ABNORMAL HIGH (ref 70–99)
Glucose-Capillary: 108 mg/dL — ABNORMAL HIGH (ref 70–99)
Glucose-Capillary: 140 mg/dL — ABNORMAL HIGH (ref 70–99)
Glucose-Capillary: 132 mg/dL — ABNORMAL HIGH (ref 70–99)

## 2020-10-09 NOTE — Progress Notes (Signed)
Occupational Therapy Treatment Patient Details Name: [name] MRN: [id number] DOB: 22-Apr-1965 Today's Date: 10/09/2020   History of present illness 55yo female s/p thoracolumbar laminectomies T11-T12, T12-L1, L1-2, and R knee injection performed in the OR 10/03/20. PMH alcoholism, OA, bipolar, cardiomegaly, CHF, chronic respiratory failure, CKD, DM, HLD, HTN, incomplete L BBB, neuropathy, multiple lumbar and cervical surgeries, L rotator cuff repair   OT comments  Pt progressing towards established OT goals, and pt very motivated to return home tomorrow. Pt performing functional mobility with Min Guard A +2 and RW. Demonstrating increased activity tolerance compared to prior session as seen by demonstrated home distance mobility in hallway. Providing education on compensatory techniques for LB ADLs, tub transfer with bench, and functional transfers. Continue to recommend dc to home with HHOT and will continue to follow acutely as admitted.    Recommendations for follow up therapy are one component of a multi-disciplinary discharge planning process, led by the attending physician.  Recommendations may be updated based on patient status, additional functional criteria and insurance authorization.    Follow Up Recommendations  Home health OT    Equipment Recommendations  None recommended by OT    Recommendations for Other Services      Precautions / Restrictions Precautions Precautions: Back Precaution Booklet Issued: No Precaution Comments: Reviewed back precautions and pt verbalizing them stating "I have had several back surgeries." No brace needed per order set, hyperalgesic R LE       Mobility Bed Mobility Overal bed mobility: Needs Assistance             General bed mobility comments: Pt sitting at EOB with PT at bedside    Transfers Overall transfer level: Needs assistance Equipment used: Rolling walker (2 wheeled) Transfers: Sit to/from Stand Sit to Stand: Min  guard;Min assist Stand pivot transfers: Min assist;+2 physical assistance       General transfer comment: Declined    Balance Overall balance assessment: Needs assistance Sitting-balance support: Bilateral upper extremity supported;Feet supported Sitting balance-Leahy Scale: Good     Standing balance support: Bilateral upper extremity supported;During functional activity Standing balance-Leahy Scale: Poor Standing balance comment: reliant on BUE support/external assist                           ADL either performed or assessed with clinical judgement   ADL Overall ADL's : Needs assistance/impaired                       Lower Body Dressing Details (indicate cue type and reason): Reviewing LB dressing techniques. Pt verbalizing she feels comfortable with these techniques Toilet Transfer: Min guard;Ambulation;RW (simulated to recliner) Toilet Transfer Details (indicate cue type and reason): sit<>stand at EOB         Functional mobility during ADLs: Minimal assistance;+2 for safety/equipment;Rolling walker (sit<>stand at EOB) General ADL Comments: Pt with increased activity tolerance this session. Providing education on LB ADLs, tub trnasfer with bench, and functional transfers     Vision       Perception     Praxis      Cognition Arousal/Alertness: Awake/alert Behavior During Therapy: WFL for tasks assessed/performed Overall Cognitive Status: Within Functional Limits for tasks assessed                                 General Comments: Slightly tangiental. Motivated to go home tomorrow  Exercises     Shoulder Instructions       General Comments      Pertinent Vitals/ Pain       Pain Assessment: Faces Faces Pain Scale: Hurts a little bit Pain Location: R knee/leg Pain Descriptors / Indicators: Numbness;Pins and needles;Tingling;Stabbing;Sharp Pain Intervention(s): Monitored during session;Limited activity within  patient's tolerance;Repositioned  Home Living                                          Prior Functioning/Environment              Frequency  Min 2X/week        Progress Toward Goals  OT Goals(current goals can now be found in the care plan section)  Progress towards OT goals: Progressing toward goals  Acute Rehab OT Goals Patient Stated Goal: less pain; reiterates that she wants to go home and will not consider SFN for rehab OT Goal Formulation: With patient Time For Goal Achievement: 10/19/20 Potential to Achieve Goals: Good ADL Goals Pt Will Perform Lower Body Dressing: with min guard assist;with adaptive equipment;sit to/from stand Pt Will Transfer to Toilet: with min guard assist;ambulating;bedside commode Pt Will Perform Toileting - Clothing Manipulation and hygiene: with min guard assist;sit to/from stand;sitting/lateral leans;with adaptive equipment  Plan Discharge plan remains appropriate    Co-evaluation    PT/OT/SLP Co-Evaluation/Treatment: Yes Reason for Co-Treatment: To address functional/ADL transfers;For patient/therapist safety   OT goals addressed during session: ADL's and self-care      AM-PAC OT "6 Clicks" Daily Activity     Outcome Measure   Help from another person eating meals?: None Help from another person taking care of personal grooming?: A Little Help from another person toileting, which includes using toliet, bedpan, or urinal?: A Lot Help from another person bathing (including washing, rinsing, drying)?: A Lot Help from another person to put on and taking off regular upper body clothing?: A Little Help from another person to put on and taking off regular lower body clothing?: A Lot 6 Click Score: 16    End of Session Equipment Utilized During Treatment: Rolling walker  OT Visit Diagnosis: Unsteadiness on feet (R26.81);Other abnormalities of gait and mobility (R26.89);Muscle weakness (generalized)  (M62.81);Pain Pain - Right/Left: Right Pain - part of body: Knee   Activity Tolerance Patient limited by pain   Patient Left in bed;with call bell/phone within reach;with bed alarm set   Nurse Communication Mobility status        Time: 3785-8850 OT Time Calculation (min): 26 min  Charges: OT General Charges $OT Visit: 1 Visit OT Treatments $Self Care/Home Management : 8-22 mins  [place], OTR/L Acute Rehab Pager: [phone] Office: [place] 10/09/2020, 4:07 PM

## 2020-10-09 NOTE — Progress Notes (Signed)
Can you let her know that she needs a 12 month follow up CT chest. She tried for a video visit last week but we never connected. Ok to place orders for nodule follow up ct   Thanks,  BLI  Garner Nash, DO [facility] 10/09/2020 9:23 PM

## 2020-10-09 NOTE — Progress Notes (Signed)
Physical Therapy Treatment Patient Details Name: [name] MRN: [id number] DOB: Dec 26, 1965 Today's Date: 10/09/2020   History of Present Illness 55yo female s/p thoracolumbar laminectomies T11-T12, T12-L1, L1-2, and R knee injection performed in the OR 10/03/20. PMH alcoholism, OA, bipolar, cardiomegaly, CHF, chronic respiratory failure, CKD, DM, HLD, HTN, incomplete L BBB, neuropathy, multiple lumbar and cervical surgeries, L rotator cuff repair    PT Comments    Continuing work on functional mobility and activity tolerance;  Pt very motivated today, with the goal of getting home tomorrow; She was able set goals for the session (walking household distance and stairs), and meet them!  On track for dc home tomorrow   Recommendations for follow up therapy are one component of a multi-disciplinary discharge planning process, led by the attending physician.  Recommendations may be updated based on patient status, additional functional criteria and insurance authorization.  Follow Up Recommendations  Home health PT;Supervision for mobility/OOB     Equipment Recommendations  Rolling walker with 5" wheels;Other (comment) Judie Petit)    Recommendations for Other Services       Precautions / Restrictions Precautions Precautions: Back Precaution Booklet Issued: No Precaution Comments: Reviewed back precautions and pt verbalizing them stating "I have had several back surgeries." No brace needed per order set, hyperalgesic R LE     Mobility  Bed Mobility Overal bed mobility: Needs Assistance Bed Mobility: Rolling;Sidelying to Sit Rolling: Supervision Sidelying to sit: Supervision       General bed mobility comments: used rails    Transfers Overall transfer level: Needs assistance Equipment used: Rolling walker (2 wheeled) Transfers: Sit to/from Stand Sit to Stand: Min guard;Min assist Stand pivot transfers: Min assist;+2 physical assistance       General transfer comment:  Requires extra time to get herself ready; cues for hand placement options  Ambulation/Gait Ambulation/Gait assistance: Min guard (chair push) Gait Distance (Feet): 75 Feet Assistive device: Rolling walker (2 wheeled) Gait Pattern/deviations: Narrow base of support Gait velocity: slow   General Gait Details: Motivated to walk, and walked in the hallway; Heavy use of RW for support and tending to lean on RW hard and keep hips at a slightly flexed position   Stairs Stairs: Yes Stairs assistance: Min guard Stair Management: One rail Left;Step to pattern;Forwards Number of Stairs: 10 General stair comments: ascended and descended stairs slowly, with good use of L rail for support   Wheelchair Mobility    Modified Rankin (Stroke Patients Only)       Balance Overall balance assessment: Needs assistance Sitting-balance support: Bilateral upper extremity supported;Feet supported Sitting balance-Leahy Scale: Good     Standing balance support: Bilateral upper extremity supported;During functional activity Standing balance-Leahy Scale: Poor Standing balance comment: reliant on BUE support/external assist                            Cognition Arousal/Alertness: Awake/alert Behavior During Therapy: WFL for tasks assessed/performed Overall Cognitive Status: Within Functional Limits for tasks assessed                                 General Comments: Slightly tangiental. Motivated to go home tomorrow      Exercises      General Comments        Pertinent Vitals/Pain Pain Assessment: Faces Faces Pain Scale: Hurts a little bit Pain Location: R knee/leg Pain Descriptors / Indicators: Numbness;Pins  and needles;Tingling;Stabbing;Sharp Pain Intervention(s): Monitored during session    Home Living                      Prior Function            PT Goals (current goals can now be found in the care plan section) Acute Rehab PT Goals Patient  Stated Goal: less pain; reiterates that she wants to go home and will not consider SFN for rehab PT Goal Formulation: With patient Time For Goal Achievement: 10/19/20 Potential to Achieve Goals: Fair Progress towards PT goals: Progressing toward goals    Frequency    Min 5X/week      PT Plan Current plan remains appropriate    Co-evaluation PT/OT/SLP Co-Evaluation/Treatment: Yes Reason for Co-Treatment: To address functional/ADL transfers;Other (comment) (pt centered decision to see together for dc planning) PT goals addressed during session: Mobility/safety with mobility;Other (comment) (stair training) OT goals addressed during session: ADL's and self-care      AM-PAC PT "6 Clicks" Mobility   Outcome Measure  Help needed turning from your back to your side while in a flat bed without using bedrails?: None Help needed moving from lying on your back to sitting on the side of a flat bed without using bedrails?: None Help needed moving to and from a bed to a chair (including a wheelchair)?: A Little Help needed standing up from a chair using your arms (e.g., wheelchair or bedside chair)?: A Little Help needed to walk in hospital room?: A Little Help needed climbing 3-5 steps with a railing? : A Little 6 Click Score: 20    End of Session   Activity Tolerance: Patient tolerated treatment well Patient left: in chair;with call bell/phone within reach;with chair alarm set   PT Visit Diagnosis: Muscle weakness (generalized) (M62.81);Unsteadiness on feet (R26.81);Difficulty in walking, not elsewhere classified (R26.2);Pain Pain - Right/Left: Right Pain - part of body: Knee     Time: 1610-9604 PT Time Calculation (min) (ACUTE ONLY): 27 min  Charges:  $Gait Training: 8-22 mins                     Roney Marion, PT  Acute Rehabilitation Services Pager [phone] Office [place] 10/09/2020, 4:53 PM

## 2020-10-09 NOTE — Progress Notes (Signed)
Patient ID: [name], female   DOB: [birth year], 55 y.o.   MRN: [id number]     Subjective: 6 Days Post-Op Procedure(s) (LRB): THORACOLUMBAR LAMINECTOMIES THORACIC ELEVEN-TWELVE, THORACIC TWELVE-LUMBAR ONE  AND LUMBAT ONE-TWO (N/A) KNEE INJECTION (Right) Awake, alert and oriented x 4. K is back to normal and glucose in the 120-130s. She reports standing at bedside and walking 10-15 feet to bedside commode and to bathroom. She has 3-4 steps up front porch of her house to negotiate to go home. Work with PT today to accomplish goals of walking stairs. Seen by  my partners this past weekend and I appreciate their care.   Patient reports pain as moderate.    Objective:   VITALS:  Temp:  [98.6 F (37 C)] 98.6 F (37 C) (09/12 0803) Pulse Rate:  [84-94] 94 (09/12 0803) Resp:  [16-17] 17 (09/12 0803) BP: (106-155)/(66-90) 155/90 (09/12 0803) SpO2:  [92 %-95 %] 92 % (09/12 0803)  Neurologically intact ABD soft Neurovascular intact Sensation intact distally Intact pulses distally Dorsiflexion/Plantar flexion intact Incision: dressing C/D/I and no drainage   LABS Recent Labs    10/07/20 0131  HGB 13.3  WBC 12.3*  PLT 256   Recent Labs    10/07/20 0131  NA 138  K 4.1  CL 100  CO2 30  BUN 16  CREATININE 0.89  GLUCOSE 128*   No results for input(s): LABPT, INR in the last 72 hours.   Assessment/Plan: 6 Days Post-Op Procedure(s) (LRB): THORACOLUMBAR LAMINECTOMIES THORACIC ELEVEN-TWELVE, THORACIC TWELVE-LUMBAR ONE  AND LUMBAT ONE-TWO (N/A) KNEE INJECTION (Right) WBC on peripheral is trending downward, likely due to steroid injection. Hgb is stable.  Advance diet Up with therapy D/C IV fluids Plan for discharge tomorrow Discharge home with home health Work on stair climbing to allow to make it into her home.   [name] 10/09/2020, 10:22 AM

## 2020-10-10 LAB — GLUCOSE, CAPILLARY
Glucose-Capillary: 129 mg/dL — ABNORMAL HIGH (ref 70–99)
Glucose-Capillary: 78 mg/dL (ref 70–99)

## 2020-10-10 MED ORDER — METHOCARBAMOL 500 MG PO TABS: 500.0000 mg | ORAL_TABLET | Freq: Four times a day (QID) | ORAL | 1 refills | Status: DC | PRN

## 2020-10-10 MED ORDER — OXYCODONE HCL 5 MG PO TABS: 5.0000 mg | ORAL_TABLET | ORAL | 0 refills | Status: DC | PRN

## 2020-10-10 NOTE — Care Management Important Message (Signed)
Important Message  Patient Details  Name: [name] MRN: [id number] Date of Birth: 09/24/1965   Medicare Important Message Given:  Yes     [place] Dino [address] 10/10/2020, 11:05 AM

## 2020-10-10 NOTE — Plan of Care (Signed)
  Problem: Education: Goal: Knowledge of General Education information will improve Description: Including pain rating scale, medication(s)/side effects and non-pharmacologic comfort measures Outcome: Adequate for Discharge   Problem: Health Behavior/Discharge Planning: Goal: Ability to manage health-related needs will improve Outcome: Adequate for Discharge   Problem: Clinical Measurements: Goal: Ability to maintain clinical measurements within normal limits will improve Outcome: Adequate for Discharge Goal: Will remain free from infection Outcome: Adequate for Discharge Goal: Diagnostic test results will improve Outcome: Adequate for Discharge Goal: Cardiovascular complication will be avoided Outcome: Adequate for Discharge   Problem: Activity: Goal: Risk for activity intolerance will decrease Outcome: Adequate for Discharge   Problem: Nutrition: Goal: Adequate nutrition will be maintained Outcome: Adequate for Discharge   Problem: Coping: Goal: Level of anxiety will decrease Outcome: Adequate for Discharge   Problem: Elimination: Goal: Will not experience complications related to bowel motility Outcome: Adequate for Discharge Goal: Will not experience complications related to urinary retention Outcome: Adequate for Discharge   Problem: Pain Managment: Goal: General experience of comfort will improve Outcome: Adequate for Discharge   Problem: Safety: Goal: Ability to remain free from injury will improve Outcome: Adequate for Discharge   Problem: Skin Integrity: Goal: Risk for impaired skin integrity will decrease Outcome: Adequate for Discharge   Problem: Acute Rehab PT Goals(only PT should resolve) Goal: Pt will Roll Supine to Side Outcome: Adequate for Discharge Goal: Pt Will Go Supine/Side To Sit Outcome: Adequate for Discharge Goal: Pt Will Go Sit To Supine/Side Outcome: Adequate for Discharge Goal: Patient Will Transfer Sit To/From Stand Outcome:  Adequate for Discharge Goal: Pt Will Transfer Bed To Chair/Chair To Bed Outcome: Adequate for Discharge Goal: Pt Will Ambulate Outcome: Adequate for Discharge Goal: Pt Will Go Up/Down Stairs Outcome: Adequate for Discharge   Problem: Acute Rehab OT Goals (only OT should resolve) Goal: Pt. Will Perform Lower Body Dressing Outcome: Adequate for Discharge Goal: Pt. Will Transfer To Toilet Outcome: Adequate for Discharge Goal: Pt. Will Perform Toileting-Clothing Manipulation Outcome: Adequate for Discharge

## 2020-10-10 NOTE — Progress Notes (Signed)
     Subjective: 7 Days Post-Op Procedure(s) (LRB): THORACOLUMBAR LAMINECTOMIES THORACIC ELEVEN-TWELVE, THORACIC TWELVE-LUMBAR ONE  AND LUMBAT ONE-TWO (N/A) KNEE INJECTION (Right) Awake, alert and oriented x 4. Pain right knee due to OA. Standing and walking able to do stairs yesterday and today. Voiding welll  Patient reports pain as moderate.    Objective:   VITALS:  Temp:  [98 F (36.7 C)-98.6 F (37 C)] 98.6 F (37 C) (09/12 2017) Pulse Rate:  [83-94] 83 (09/12 2017) Resp:  [16-17] 16 (09/12 2017) BP: (130-155)/(68-90) 130/68 (09/12 2017) SpO2:  [92 %-98 %] 98 % (09/12 2017)  Neurologically intact ABD soft Neurovascular intact Sensation intact distally Intact pulses distally Dorsiflexion/Plantar flexion intact Incision: dressing C/D/I and no drainage   LABS No results for input(s): HGB, WBC, PLT in the last 72 hours. No results for input(s): NA, K, CL, CO2, BUN, CREATININE, GLUCOSE in the last 72 hours. No results for input(s): LABPT, INR in the last 72 hours.   Assessment/Plan: 7 Days Post-Op Procedure(s) (LRB): THORACOLUMBAR LAMINECTOMIES THORACIC ELEVEN-TWELVE, THORACIC TWELVE-LUMBAR ONE  AND LUMBAT ONE-TWO (N/A) KNEE INJECTION (Right)  Advance diet Up with therapy Discharge home with home health  [name] 10/10/2020, 7:52 AM Patient ID: [name], female   DOB: May 25, 1965, 55 y.o.   MRN: [id number]

## 2020-10-11 ENCOUNTER — Telehealth: Payer: Self-pay | Admitting: Specialist

## 2020-10-11 NOTE — Telephone Encounter (Signed)
Pt just got out of hospital and states she needs appt for next week to get staples out. Please open a spot for pt  CB [phone]

## 2020-10-11 NOTE — Telephone Encounter (Signed)
Please schedule her with Jeneen Rinks next week.

## 2020-10-12 ENCOUNTER — Telehealth: Payer: Self-pay | Admitting: Pharmacist

## 2020-10-12 NOTE — Telephone Encounter (Signed)
Patient contacted for follow/up of tobacco intake reduction / tobacco cessation attempt.   Since last contact patient reports that her back surgery went well but her knee injection lead to a significant amount of pain as she thinks the knee injection "hit a nerve"   Medications currently being used; Varenicline (continue to take even though she believes it is not working as well the original (branded Chantix).   Rates IMPORTANCE of quitting tobacco remains high. Rates CONFIDENCE of quitting tobacco limited due to pain of recovery.   Most common triggers to use tobacco include; pain   No change in treatment plan at this time.  Total time with patient call and documentation of interaction: 12 minutes.  F/U Phone call planned: 10 days

## 2020-10-12 NOTE — Telephone Encounter (Signed)
Noted and agree. 

## 2020-10-17 NOTE — Discharge Summary (Signed)
Patient ID: [name] MRN: [id number] DOB/AGE: 1965-11-19 55 y.o.  Admit date: 10/03/2020 Discharge date: 10/10/2020  Admission Diagnoses:  Active Problems:   Status post lumbar laminectomy   Discharge Diagnoses:  Active Problems:   Status post lumbar laminectomy  status post Procedure(s): THORACOLUMBAR LAMINECTOMIES THORACIC ELEVEN-TWELVE, THORACIC TWELVE-LUMBAR ONE  AND LUMBAT ONE-TWO KNEE INJECTION  Past Medical History:  Diagnosis Date   Alcoholism ([place])    Anxiety    Arthritis 04-10-11   hips, shoulders, back   Asthma    Bipolar disorder ([place])    Cardiomegaly    Carpal tunnel syndrome, bilateral    Cervical cancer ([place]) 1993   cervical, no treatment done, went away per pt   Cervical dysplasia or atypia 04-10-11   '93- once dx.-got pregnant-no intervention, then postpartum, no dysplasia found   CHF (congestive heart failure) ([place])    no cardiologist 2014 dx, none now   Chronic hypoxemic respiratory failure (H[facility])    Chronic kidney disease    stage 3 kidney disease   Condyloma - gluteal cleft 04/09/2011   Removed by general surgery. Pathology showed Condyloma, gluteal CONDYLOMA ACUMINATUM.    COPD (chronic obstructive pulmonary disease) ([place])    Depression    Diabetes mellitus without complication ([place])    Dyspnea    with actity, sitting   Fall 12/16/2016   Fibrocystic breast disease    GERD (gastroesophageal reflux disease)    Grade I diastolic dysfunction 69/50/7225   Noted on ECHO   Hepatitis    hep B-count is low at present,doesn't register   History of PSVT (paroxysmal supraventricular tachycardia)    Hypercalcemia    Hyperlipidemia    Hypertension    Incomplete left bundle branch block (LBBB) 09/24/2018   Noted on EKG   Lung nodule 11/2017   right and left lung   Migraine headache    none recent   Morbid obesity ([place]) 03/27/2006   Neuropathy    Pneumonia    walking pneu 15 yrs. ago   Skin lesion 03/15/2011   In gluteal crease now s/p removal  by Dr. [name] of General Surgery on 3/12. Path shows condyloma.      Sleep apnea 04-10-11   uses cpap, pt does not know settings   Trigger finger    left third   Urge incontinence of urine     Surgeries: Procedure(s): THORACOLUMBAR LAMINECTOMIES THORACIC ELEVEN-TWELVE, THORACIC TWELVE-LUMBAR ONE  AND LUMBAT ONE-TWO KNEE INJECTION on 10/03/2020   Consultants:   Discharged Condition: Improved  Hospital Course: [name] is an 55 y.o. female who was admitted 10/03/2020 for operative treatment of T11-12 and T12-L1 stenosis and knee pain. Patient failed conservative treatments (please see the history and physical for the specifics) and had severe unremitting pain that affects sleep, daily activities and work/hobbies. After pre-op clearance, the patient was taken to the operating room on 10/03/2020 and underwent  Procedure(s): THORACOLUMBAR LAMINECTOMIES THORACIC ELEVEN-TWELVE, THORACIC TWELVE-LUMBAR ONE  AND LUMBAT ONE-TWO KNEE INJECTION.    Patient was given perioperative antibiotics:  Anti-infectives (From admission, onward)    Start     Dose/Rate Route Frequency Ordered Stop   10/03/20 1630  ceFAZolin (ANCEF) IVPB 2g/100 mL premix        2 g 200 mL/hr over 30 Minutes Intravenous Every 8 hours 10/03/20 1508 10/04/20 0055   10/03/20 0600  ceFAZolin (ANCEF) IVPB 2g/100 mL premix        2 g 200 mL/hr over 30 Minutes Intravenous On call  to O.R. 10/03/20 2703 10/03/20 5009        Patient was given sequential compression devices and early ambulation to prevent DVT.   Patient benefited maximally from hospital stay and there were no complications. At the time of discharge, the patient was urinating/moving their bowels without difficulty, tolerating a regular diet, pain is controlled with oral pain medications and they have been cleared by PT/OT.   Recent vital signs: No data found.   Recent laboratory studies: No results for input(s): WBC, HGB, HCT, PLT, NA, K, CL, CO2, BUN, CREATININE,  GLUCOSE, INR, CALCIUM in the last 72 hours.  Invalid input(s): PT, 2   Discharge Medications:   Allergies as of 10/10/2020       Reactions   Aspirin Other (See Comments)   Kidney disease   Methadone Hcl Other (See Comments)    "blacked out" in 1990's   Corticosteroids Other (See Comments)   Hyperglycemia   Levofloxacin Itching        Medication List     STOP taking these medications    enoxaparin 40 MG/0.4ML injection Commonly known as: LOVENOX   HYDROcodone-acetaminophen 10-325 MG tablet Commonly known as: NORCO   ondansetron 4 MG disintegrating tablet Commonly known as: ZOFRAN-ODT   Ozempic (0.25 or 0.5 MG/DOSE) 2 MG/1.5ML Sopn Generic drug: Semaglutide(0.25 or 0.5MG/DOS)       TAKE these medications    Accu-Chek Aviva Plus test strip Generic drug: glucose blood 1 EACH BY OTHER ROUTE 3 (THREE) TIMES DAILY. TEST 3 TIMES DAILY   Accu-Chek Softclix Lancets lancets USE TO TEST 4 TIMES A DAY   acetaminophen 500 MG tablet Commonly known as: TYLENOL Take 1,000 mg by mouth 2 (two) times daily.   albuterol 108 (90 Base) MCG/ACT inhaler Commonly known as: VENTOLIN HFA Inhale 2 puffs into the lungs every 6 (six) hours as needed for wheezing or shortness of breath.   allopurinol 300 MG tablet Commonly known as: ZYLOPRIM Take 300 mg by mouth in the morning.   ammonium lactate 12 % lotion Commonly known as: LAC-HYDRIN APPLY TWICE A DAY AS NEEDED FOR DRY SKIN What changed: See the new instructions.   atorvastatin 40 MG tablet Commonly known as: LIPITOR TAKE 1 TABLET BY MOUTH EVERY DAY What changed: when to take this   b complex vitamins capsule Take 1 capsule by mouth daily.   B-D UF III MINI PEN NEEDLES 31G X 5 MM Misc Generic drug: Insulin Pen Needle CHECK SUGARS 4 TIMES A DAY BEFORE MEALS AND AT BEDTIME.   baclofen 10 MG tablet Commonly known as: LIORESAL Take 1 tablet (10 mg total) by mouth 3 (three) times daily as needed for muscle spasms. What  changed: when to take this   Bariatric Multivitamins/Iron Caps Take 1 capsule by mouth daily.   blood glucose meter kit and supplies Kit Dispense based on patient and insurance preference. Use up to four times daily as directed. (FOR ICD-9 250.00, 250.01).   busPIRone 10 MG tablet Commonly known as: BUSPAR Take 1 tablet (10 mg total) by mouth 3 (three) times daily.   calcium carbonate 1500 (600 Ca) MG Tabs tablet Commonly known as: OSCAL Take 1,500 mg by mouth 3 (three) times daily with meals.   diclofenac Sodium 1 % Gel Commonly known as: VOLTAREN APPLY 2 GRAMS TO AFFECTED AREA 4 TIMES A DAY   diltiazem 120 MG 24 hr capsule Commonly known as: CARDIZEM CD TAKE 1 CAPSULE (120 MG TOTAL) BY MOUTH 2 (TWO) TIMES DAILY.  docusate sodium 100 MG capsule Commonly known as: COLACE Take 200 mg by mouth 2 (two) times daily.   fluticasone 50 MCG/ACT nasal spray Commonly known as: FLONASE PLACE 2 SPRAYS INTO BOTH NOSTRILS TWICE DAILY   gabapentin 300 MG capsule Commonly known as: NEURONTIN Take 2 capsules (600 mg total) by mouth 3 (three) times daily. TAKE 2 CAPSULES (600 MG TOTAL) BY MOUTH in the morning and at lunch and take 3 capsules at night   HumaLOG KwikPen 100 UNIT/ML KwikPen Generic drug: insulin lispro INJECT 18 UNITS 3 TIMES A DAY BEFORE MEALS What changed: See the new instructions.   hydrOXYzine 10 MG tablet Commonly known as: ATARAX/VISTARIL TAKE 1 TABLET BY MOUTH EVERY DAY   ketoconazole 2 % cream Commonly known as: NIZORAL APPLY 1 FINGERTIP AMOUNT TO EACH FOOT DAILY. What changed: See the new instructions.   loratadine 10 MG tablet Commonly known as: CLARITIN Take 1 tablet (10 mg total) by mouth daily.   losartan 100 MG tablet Commonly known as: COZAAR Take 1 tablet (100 mg total) by mouth daily.   methocarbamol 500 MG tablet Commonly known as: ROBAXIN Take 1 tablet (500 mg total) by mouth every 6 (six) hours as needed for muscle spasms.   metolazone  2.5 MG tablet Commonly known as: ZAROXOLYN TAKE 1 TABLET EVERY OTHER DAY. TAKE 1 TABLET TWICE WEEKLY ON MONDAYS AND THURSDAYS ONLY   montelukast 10 MG tablet Commonly known as: SINGULAIR TAKE 1 TABLET BY MOUTH EVERYDAY AT BEDTIME   Narcan 4 MG/0.1ML Liqd nasal spray kit Generic drug: naloxone Place 1 spray into the nose as needed (accidental overdose).   NON FORMULARY Uses a C-PAP at bedtime   nystatin 100000 UNIT/ML suspension Commonly known as: MYCOSTATIN TAKE 5 MLS (500,000 UNITS TOTAL) BY MOUTH 4 (FOUR) TIMES DAILY. SWISH AND SWALLOW.   omeprazole 20 MG capsule Commonly known as: PRILOSEC TAKE 1 CAPSULE BY MOUTH EVERY DAY   oxyCODONE 5 MG immediate release tablet Commonly known as: Oxy IR/ROXICODONE Take 1-3 tablets (5-15 mg total) by mouth every 4 (four) hours as needed for severe pain.   Potassium Chloride ER 20 MEQ Tbcr Take 20 mEq by mouth daily.   Trelegy Ellipta 100-62.5-25 MCG/INH Aepb Generic drug: Fluticasone-Umeclidin-Vilant Inhale 1 puff into the lungs daily.   varenicline 1 MG tablet Commonly known as: CHANTIX Take 1 tablet (1 mg total) by mouth 2 (two) times daily.   venlafaxine XR 75 MG 24 hr capsule Commonly known as: EFFEXOR-XR TAKE 1 CAPSULE BY MOUTH DAILY WITH BREAKFAST.        Diagnostic Studies: DG Thoracic Spine 2 View  Result Date: 10/04/2020 CLINICAL DATA:  Bilateral leg numbness.  Surgery yesterday. EXAM: THORACIC SPINE 2 VIEWS COMPARISON:  Thoracic spine MRI earlier today. FINDINGS: Laminectomy changes on recent MRI are not well depicted by radiograph. There is no evidence of fracture. Skin staples are in place. Straightening of normal kyphosis. Cervicothoracic fusion hardware is intact were visualized. Multilevel endplate spurring. No vertebral body compression fracture. IMPRESSION: 1. Laminectomy changes on recent MRI are not well depicted by radiograph. 2. Straightening of normal kyphosis with multilevel degenerative disc disease.  Electronically Signed   By: Keith Rake M.D.   On: 10/04/2020 21:13   DG Lumbar Spine 2-3 Views  Result Date: 10/04/2020 CLINICAL DATA:  Back pain after surgery yesterday. EXAM: LUMBAR SPINE - 2-3 VIEW COMPARISON:  Intraoperative localization film yesterday. FINDINGS: Posterior rod with intrapedicular screw fusion L2 through L5 with intact hardware. Interbody spacers in place.  No acute fracture. Endplate spurring at S93-T3. Vertebral body heights are normal. Skin staples overlie the thoracolumbar junction. IMPRESSION: Posterior rod and intrapedicular screw fusion L2 through L5 with interbody spacers in place. No acute lumbar findings. Electronically Signed   By: Keith Rake M.D.   On: 10/04/2020 21:15   DG Lumbar Spine 2-3 Views  Result Date: 10/03/2020 CLINICAL DATA:  Spinal localization for T11-T12 laminectomy. EXAM: LUMBAR SPINE - 2-3 VIEW COMPARISON:  Lumbar spine radiographs-06/30/2020; lumbar spine MRI-07/18/2020 FINDINGS: Two spot lateral projection radiographic images the lumbar spine are provided for review Spinal labeling is in keeping with preprocedural lumbar spine MRI and based on pre-existing lumbar spine fusion hardware. Radiograph labeled #1 demonstrates a radiopaque marking instrument overlying the soft tissues posterior to T12 vertebral body Radiograph labeled #2 demonstrates radiopaque marking instrument posterior to the T12 and L1 vertebral bodies. IMPRESSION: Intraoperative localization of T12 and L1 as detailed above. Above was called to OR 18 at the time of this dictation. Electronically Signed   By: Sandi Mariscal M.D.   On: 10/03/2020 10:08   MR THORACIC SPINE W WO CONTRAST  Result Date: 10/04/2020 CLINICAL DATA:  Postop bilateral leg weakness and numbness, status post T11-L1 laminectomy for stenosis. EXAM: MRI THORACIC WITHOUT AND WITH CONTRAST TECHNIQUE: Multiplanar and multiecho pulse sequences of the thoracic spine were obtained without and with intravenous contrast.  CONTRAST:  15m GADAVIST GADOBUTROL 1 MMOL/ML IV SOLN COMPARISON:  Lumbar spine intraoperative radiographs 10/03/2020, CT of the thoracic spine 04/21/2009. FINDINGS: Status post posterior cervical fusion, extending to the T1 level. Status post interval posterior decompression of T11-L1. With significant fluid in the operative bed, as well as a small amount of air posterior to the thecal sac. This fluid causes focal narrowing of the thecal sac at T11-T12, which is moderate to severe. No abnormal enhancement in the resection bed. Alignment:  Physiologic. Vertebrae: No acute fracture, evidence of discitis, or suspicious bone lesion. Cord:  Normal signal and morphology. Paraspinal and other soft tissues: Fluid collection in the soft tissues of the back, likely postoperative. This fluid collection does not appear contiguous with the fluid collection posterior to the thecal sac. Disc levels: Multilevel small disc bulges, which cause mild-to-moderate spinal canal stenosis at T5-T6, T9-T10, and T10-T11. Moderate bilateral neural foraminal narrowing at T1-T2 IMPRESSION: Status post recent posterior decompression T11-L1, with postoperative fluid and air posterior to the thecal sac at these levels that causes moderate to severe thecal sac narrowing. No evidence of postoperative hematoma, cord compression, or cord signal abnormality. Electronically Signed   By: AMerilyn BabaM.D.   On: 10/04/2020 19:38   DG Lumbar Spine 1 View  Result Date: 10/03/2020 CLINICAL DATA:  Surgical localization. EXAM: LUMBAR SPINE - 1 VIEW COMPARISON:  Radiograph of same day.  MRI of July 18, 2020. FINDINGS: Single intraoperative cross-table lateral projection was obtained of the lumbar spine. This image demonstrates surgical tools posterior to the T12 and L1 and L2 vertebral bodies. Surgical probe is seen directed toward the L2 vertebral body. IMPRESSION: Surgical localization as described above. Electronically Signed   By: JMarijo ConceptionM.D.    On: 10/03/2020 13:19    Discharge Instructions     Call MD / Call 911   Complete by: As directed    If you experience chest pain or shortness of breath, CALL 911 and be transported to the hospital emergency room.  If you develope a fever above 101 F, pus (white drainage) or increased drainage or redness  at the wound, or calf pain, call your surgeon's office.   Constipation Prevention   Complete by: As directed    Drink plenty of fluids.  Prune juice may be helpful.  You may use a stool softener, such as Colace (over the counter) 100 mg twice a day.  Use MiraLax (over the counter) for constipation as needed.   Diet - low sodium heart healthy   Complete by: As directed    Discharge instructions   Complete by: As directed    No lifting greater than 10 lbs. Avoid bending, stooping and twisting. Walk in house for first week them may start to get out slowly increasing distance up to one quarter mile by 3 weeks post op. Keep incision dry for 3 days, may use tegaderm or similar water impervious dressing.   Driving restrictions   Complete by: As directed    No driving for 3 weeks   Increase activity slowly as tolerated   Complete by: As directed    Lifting restrictions   Complete by: As directed    No lifting for 8 weeks   Post-operative opioid taper instructions:   Complete by: As directed    POST-OPERATIVE OPIOID TAPER INSTRUCTIONS: It is important to wean off of your opioid medication as soon as possible. If you do not need pain medication after your surgery it is ok to stop day one. Opioids include: Codeine, Hydrocodone(Norco, Vicodin), Oxycodone(Percocet, oxycontin) and hydromorphone amongst others.  Long term and even short term use of opiods can cause: Increased pain response Dependence Constipation Depression Respiratory depression And more.  Withdrawal symptoms can include Flu like symptoms Nausea, vomiting And more Techniques to manage these symptoms Hydrate well Eat  regular healthy meals Stay active Use relaxation techniques(deep breathing, meditating, yoga) Do Not substitute Alcohol to help with tapering If you have been on opioids for less than two weeks and do not have pain than it is ok to stop all together.  Plan to wean off of opioids This plan should start within one week post op of your joint replacement. Maintain the same interval or time between taking each dose and first decrease the dose.  Cut the total daily intake of opioids by one tablet each day Next start to increase the time between doses. The last dose that should be eliminated is the evening dose.           Follow-up Information     Jessy Oto, MD Follow up in 1 week(s).   Specialty: Orthopedic Surgery Why: For wound re-check Contact information: [place] [address] [phone]         Care, [facility]. Schedule an appointment as soon as possible for a visit.   Specialty: Home Health Services Contact information: [place] [address] [phone]                 Discharge Plan:  discharge to home  Disposition:     Signed: Benjiman Core  10/17/2020, 4:40 PM

## 2020-10-19 ENCOUNTER — Inpatient Hospital Stay: Payer: 59 | Admitting: Surgery

## 2020-10-20 ENCOUNTER — Encounter: Payer: Self-pay | Admitting: Specialist

## 2020-10-20 ENCOUNTER — Encounter: Payer: Self-pay | Admitting: *Deleted

## 2020-10-20 ENCOUNTER — Other Ambulatory Visit: Payer: Self-pay | Admitting: *Deleted

## 2020-10-20 DIAGNOSIS — R911 Solitary pulmonary nodule: Secondary | ICD-10-CM

## 2020-10-23 ENCOUNTER — Telehealth: Payer: Self-pay | Admitting: Pharmacist

## 2020-10-23 ENCOUNTER — Telehealth: Payer: Self-pay | Admitting: Specialist

## 2020-10-23 NOTE — Telephone Encounter (Signed)
I called and advised Langley Gauss, that we have rec'd the orders, however the Dr. Is out of the office till Wednesday. She states that is fine.

## 2020-10-23 NOTE — Telephone Encounter (Signed)
Patient contacted for follow/up of tobacco intake reduction.   Since last contact patient reports her back is improved since her back surgery however her knee is giving her problems which necessitated an ambulance to help her up last week.    Medications currently being used;  Varenicline - previously.    Most common triggers to use tobacco include; pain and stress.     Motivation to quit: Not ready at this time due to stress, boredom and habit of smoking.    Total time with patient call and documentation of interaction: 11 minutes.  F/U Phone call planned: 3 weeks.

## 2020-10-23 NOTE — Telephone Encounter (Signed)
Denise from [facility] called. Says she will be faxing orders for home health for patient. Her call back number is [phone]

## 2020-10-23 NOTE — Telephone Encounter (Signed)
-----   Message from Leavy Cella, [place] sent at 10/12/2020 10:57 AM EDT ----- Regarding: Tobacco Cessation - Quit Date?  Using varenicline

## 2020-10-25 ENCOUNTER — Ambulatory Visit: Payer: 59 | Admitting: Podiatry

## 2020-10-26 ENCOUNTER — Encounter: Payer: Self-pay | Admitting: Surgery

## 2020-10-26 ENCOUNTER — Other Ambulatory Visit: Payer: Self-pay

## 2020-10-26 ENCOUNTER — Ambulatory Visit (INDEPENDENT_AMBULATORY_CARE_PROVIDER_SITE_OTHER): Payer: 59 | Admitting: Surgery

## 2020-10-26 VITALS — BP 163/78 | HR 84 | Ht 69.0 in | Wt 313.0 lb

## 2020-10-26 DIAGNOSIS — M4804 Spinal stenosis, thoracic region: Secondary | ICD-10-CM

## 2020-10-26 DIAGNOSIS — M4807 Spinal stenosis, lumbosacral region: Secondary | ICD-10-CM

## 2020-10-26 NOTE — Progress Notes (Signed)
55 year old black female who is about 3 weeks status post who is that is post T11-L2 decompression returns.  States that she is making some improvement.  States that she had "nerve pain from right knee injection" that was done under anesthesia.  Advised patient that I do not think that the knee injection would cause nerve pain that she is describing and was likely related to surgery.  Overall she is progressing.   Exam Pleasant female alert and oriented in no acute distress.  Wound looks good.  Staples removed and Steri-Strips applied.  No drainage or signs of infection.  Neurologically intact.  Plan Again I try to get a reassure patient that nerve was not hit or injured during intra-articular knee injection.  She does not appear convinced with my long discussion with her and said that she would think about it.  Patient was very pleasant during the entire appointment.  I will have her follow-up with Dr. Louanne Skye as already scheduled in a few weeks.  She will return to the clinic sooner if needed.  All questions answered.

## 2020-10-29 ENCOUNTER — Other Ambulatory Visit: Payer: Self-pay | Admitting: Family Medicine

## 2020-10-30 ENCOUNTER — Encounter: Payer: Self-pay | Admitting: Family Medicine

## 2020-10-30 MED ORDER — HYDROXYZINE HCL 10 MG PO TABS: 10.0000 mg | ORAL_TABLET | Freq: Every day | ORAL | 3 refills | Status: DC

## 2020-10-30 NOTE — Telephone Encounter (Signed)
Called patient.  She is requesting refill on hydroxyzine 10 mg sent to her pharmacy on [facility].  Prescription sent.  No further questions or concerns.

## 2020-11-01 ENCOUNTER — Encounter: Payer: Self-pay | Admitting: Family Medicine

## 2020-11-01 DIAGNOSIS — M1611 Unilateral primary osteoarthritis, right hip: Secondary | ICD-10-CM

## 2020-11-01 DIAGNOSIS — Z9889 Other specified postprocedural states: Secondary | ICD-10-CM

## 2020-11-03 ENCOUNTER — Ambulatory Visit: Payer: Self-pay

## 2020-11-03 NOTE — Chronic Care Management (AMB) (Addendum)
Care Management  Consultation Note  92/0/1007 Name: CATHERINA PATES MRN: [id number] DOB: [birth year]  [name] is a 55 y.o. year old female who is a primary care patient of [name], MD. The CCM team was consulted for Consultation reference care coordination needs for Heavy Duty Rollator.   Assessment:  I see that the order has been placed for a walker, but it will need to specify a heavy duty. As stated in the email from the insurance company, there will need to be an office visit to address the need for the walker. The HCPCs code needed is below.  E0149 Short Description: Heavy-duty wheeled walker Long Description: WALKER, HEAVY DUTY, WHEELED, RIGID OR FOLDING, ANY TYPE Additional Search Terminology: ROLLATOR; GAIT TRAINER Product and Service Code(s): M05: WALKERS. The RN team can send a message to [employer] to ask for the walker's price, and any other information require.   Intervention: Patient was not interviewed or contacted during this encounter.    CCM RNCM conducted brief assessment, recommendations and relevant information discussed.   Follow up Plan: If further intervention is needed the care management team is available to follow up after a formal CCM referral is placed   Collaboration with [name], MD regarding development and update of comprehensive plan of care as evidenced by provider attestation and co-signature Review of patient past medical history, allergies, medications, and health status, including review of pertinent consultant reports was performed as part of comprehensive evaluation and provision of care management/care coordination services.      Lazaro Arms RN, BSN, [facility] Care Management Coordinator [place] Phone: [phone] I Fax: [phone]

## 2020-11-04 ENCOUNTER — Other Ambulatory Visit: Payer: Self-pay | Admitting: Family Medicine

## 2020-11-04 DIAGNOSIS — B353 Tinea pedis: Secondary | ICD-10-CM

## 2020-11-04 DIAGNOSIS — E1151 Type 2 diabetes mellitus with diabetic peripheral angiopathy without gangrene: Secondary | ICD-10-CM

## 2020-11-07 ENCOUNTER — Other Ambulatory Visit: Payer: Self-pay | Admitting: Family Medicine

## 2020-11-07 ENCOUNTER — Encounter: Payer: Self-pay | Admitting: Family Medicine

## 2020-11-09 ENCOUNTER — Other Ambulatory Visit: Payer: Self-pay | Admitting: Family Medicine

## 2020-11-09 DIAGNOSIS — B353 Tinea pedis: Secondary | ICD-10-CM

## 2020-11-09 DIAGNOSIS — E1151 Type 2 diabetes mellitus with diabetic peripheral angiopathy without gangrene: Secondary | ICD-10-CM

## 2020-11-14 NOTE — Telephone Encounter (Signed)
Received voicemail from [facility] (no return number left) stating that patient's rollator has been approved and that our office just needs to send appropriate documentation to [facility].  They were told that we already know their process for DME orders. Will forward to RN team.  Thanks Johnney Ou

## 2020-11-14 NOTE — Telephone Encounter (Signed)
Called [employer] regarding order. Order will need to be sent over with demographics and clinical notes that speak to need for walker.   Patient has an appointment with PCP tomorrow. Please include this documentation and route note to RN team once completed.   Order will need to be faxed to [phone].  Thanks.   Talbot Grumbling, RN

## 2020-11-15 ENCOUNTER — Encounter: Payer: Self-pay | Admitting: Family Medicine

## 2020-11-15 ENCOUNTER — Ambulatory Visit: Payer: 59 | Admitting: Family Medicine

## 2020-11-15 NOTE — Progress Notes (Deleted)
    SUBJECTIVE:   CHIEF COMPLAINT / HPI:   Heavy Duty walker documentation   SGLT2?  PERTINENT  PMH / PSH: ***  OBJECTIVE:   LMP 04/10/2006   ***  ASSESSMENT/PLAN:   No problem-specific Assessment & Plan notes found for this encounter.     [name], MD [place]

## 2020-11-16 ENCOUNTER — Telehealth: Payer: Self-pay | Admitting: Pharmacist

## 2020-11-16 NOTE — Telephone Encounter (Signed)
Noted and agree. 

## 2020-11-16 NOTE — Telephone Encounter (Signed)
Patient contacted for follow/up of tobacco intake.  Since last contact patient reports returning to smoking ~ 1 ppd.  She reports pain in back and legs.  Swelling in her legs.  She reports she has been in her home and not out much recently due to her pain and swelling.    Medications currently being used; NONE Varenicline - stopped stating " generic Chantix does not work"   Patient denies any significant side effects from tobacco cessation therapy.   Rates IMPORTANCE of quitting tobacco is still rated as high BUT she states she will continue to smoke for the short-term future.  We agreed on a 27-month follow-up to reconsider smoking cessation plan at that time.     Total time with patient call and documentation of interaction: 11 minutes.

## 2020-11-24 ENCOUNTER — Ambulatory Visit: Payer: 59 | Admitting: Specialist

## 2020-11-30 ENCOUNTER — Encounter: Payer: Self-pay | Admitting: Specialist

## 2020-11-30 ENCOUNTER — Ambulatory Visit (INDEPENDENT_AMBULATORY_CARE_PROVIDER_SITE_OTHER): Payer: 59 | Admitting: Specialist

## 2020-11-30 ENCOUNTER — Other Ambulatory Visit: Payer: Self-pay

## 2020-11-30 VITALS — BP 123/81 | HR 101 | Ht 69.0 in | Wt 313.0 lb

## 2020-11-30 DIAGNOSIS — Z9889 Other specified postprocedural states: Secondary | ICD-10-CM

## 2020-11-30 DIAGNOSIS — R29898 Other symptoms and signs involving the musculoskeletal system: Secondary | ICD-10-CM

## 2020-11-30 NOTE — Patient Instructions (Signed)
Plan: Avoid frequent bending and stooping  No lifting greater than 10 lbs. May use ice or moist heat for pain. Weight loss is of benefit. We will ask for an evaluation at [place]  with [facility] for consideration of pool exercise program to gradually strengthen the right lower extremity.  Takes hydrocodone from [facility].  Exercise is important to improve your indurance and does allow people to function better inspite of back pain.

## 2020-11-30 NOTE — Progress Notes (Signed)
Post-Op Visit Note   Patient: [name]           Date of Birth: Feb 05, 1965           MRN: [id number] Visit Date: 11/30/2020 PCP: [name], MD   Assessment & Plan:2 months post op thoracolumbar laminectomy for stenosis with right proximal residual hip flexion and right quadriceps weakness, improved claudication symptoms. DecompressionT11 to L2 above lumbar fusion L2 to S1.   Chief Complaint:  Chief Complaint  Patient presents with   Lower Back - Routine Post Op    2 months postop T/L Laminectomies T11-L2  Feels weak right leg and the leg gives away. Pain is better. Incision is healed. No bowel or bladder difficulty.   Visit Diagnoses:  1. Right leg weakness   2. Status post laminectomy     Plan: Avoid frequent bending and stooping  No lifting greater than 10 lbs. May use ice or moist heat for pain. Weight loss is of benefit. We will ask for an evaluation at [place]  with [facility] for consideration of pool exercise program to gradually strengthen the right lower extremity.  Takes hydrocodone from [facility].  Exercise is important to improve your indurance and does allow people to function better inspite of back pain.    Follow-Up Instructions: Return in about 4 weeks (around 12/28/2020).   Orders:  Orders Placed This Encounter  Procedures   Ambulatory referral to Physical Therapy   No orders of the defined types were placed in this encounter.   Imaging: No results found.  PMFS History: Patient Active Problem List   Diagnosis Date Noted   Status post lumbar laminectomy 10/03/2020   Cubital tunnel syndrome on left 05/02/2020   Vaginal discharge 01/20/2020   Prolapse of anterior vaginal wall 11/10/2019   Skin ulcer of left thigh, limited to breakdown of skin ([place]) 09/09/2019   Ulnar nerve compression 08/06/2019   Diabetic neuropathy associated with type 2 diabetes mellitus ([place]) 08/06/2019   Body mass index 50.0-59.9, adult ([place])  06/12/2019   Impingement syndrome of left shoulder 05/07/2019   Polyp of colon    Polyp of ascending colon    Rectal discomfort 02/12/2019   Osteoarthritis of right hip 02/12/2019   Nontraumatic tear of left supraspinatus tendon 12/30/2018   Gout 12/17/2018   Nontraumatic complete tear of right rotator cuff 12/08/2018   Rotator cuff tear, right 12/01/2018   Tobacco abuse 08/21/2018   Right groin pain 07/20/2018   Depressed mood 07/13/2018   Paroxysmal SVT (supraventricular tachycardia) ([place]) 03/20/2018   Pure hypercholesterolemia 03/20/2018   Muscle cramping 02/25/2018   Nodule of upper lobe of right lung 02/13/2018   Falls, subsequent encounter 01/30/2018   Pain in left foot 11/04/2017   Essential hypertension 10/27/2017   Solitary pulmonary nodule 10/07/2017   Restrictive lung disease secondary to obesity 10/01/2017   Polycythemia, secondary 10/01/2017   Chronic viral hepatitis B without delta-agent ([place]) 07/08/2017   COPD (chronic obstructive pulmonary disease) with chronic bronchitis ([place]) 06/27/2017   Chronic kidney disease (CKD), stage IV (severe) ([place]) 01/14/2017   Type 2 diabetes mellitus with stage 3 chronic kidney disease, with long-term current use of insulin ([place]) 12/12/2016   Urge incontinence of urine 12/05/2016   Trigger finger, left index finger 07/15/2016   Back pain 04/07/2014   Morbid obesity ([place]) 10/02/2012   Chronic diastolic CHF (congestive heart failure) ([place]) 04/07/2012   GERD 01/26/2010   Obstructive sleep apnea treated with BiPAP 02/03/2008  FIBROCYSTIC BREAST DISEASE 10/28/2006   Hyperlipidemia 03/27/2006   Obesity hypoventilation syndrome ([place]) 03/27/2006   Depression with anxiety 03/27/2006   Past Medical History:  Diagnosis Date   Alcoholism ([place])    Anxiety    Arthritis 04-10-11   hips, shoulders, back   Asthma    Bipolar disorder ([place])    Cardiomegaly    Carpal tunnel syndrome, bilateral    Cervical cancer ([place]) 1993   cervical, no  treatment done, went away per pt   Cervical dysplasia or atypia 04-10-11   '93- once dx.-got pregnant-no intervention, then postpartum, no dysplasia found   CHF (congestive heart failure) ([place])    no cardiologist 2014 dx, none now   Chronic hypoxemic respiratory failure ([place])    Chronic kidney disease    stage 3 kidney disease   Condyloma - gluteal cleft 04/09/2011   Removed by general surgery. Pathology showed Condyloma, gluteal CONDYLOMA ACUMINATUM.    COPD (chronic obstructive pulmonary disease) ([place])    Depression    Diabetes mellitus without complication ([place])    Dyspnea    with actity, sitting   Fall 12/16/2016   Fibrocystic breast disease    GERD (gastroesophageal reflux disease)    Grade I diastolic dysfunction 85/27/7824   Noted on ECHO   Hepatitis    hep B-count is low at present,doesn't register   History of PSVT (paroxysmal supraventricular tachycardia)    Hypercalcemia    Hyperlipidemia    Hypertension    Incomplete left bundle branch block (LBBB) 09/24/2018   Noted on EKG   Lung nodule 11/2017   right and left lung   Migraine headache    none recent   Morbid obesity ([place]) 03/27/2006   Neuropathy    Pneumonia    walking pneu 15 yrs. ago   Skin lesion 03/15/2011   In gluteal crease now s/p removal by Dr. [name] of General Surgery on 3/12. Path shows condyloma.      Sleep apnea 04-10-11   uses cpap, pt does not know settings   Trigger finger    left third   Urge incontinence of urine     Family History  Problem Relation Age of Onset   Pulmonary embolism Mother    Stroke Father    Alcohol abuse Father    Pneumonia Father    Hypertension Father    Breast cancer Maternal Aunt    Hypertension Other    Diabetes Other        Aunts and cousins   Asthma Sister    Depression Sister    Arthritis Sister    Sickle cell anemia Son     Past Surgical History:  Procedure Laterality Date   ANAL FISTULECTOMY  04/17/2011   Procedure: FISTULECTOMY ANAL;  Surgeon:  Imogene Burn. [name], MD;  Location: [facility];  Service: General;  Laterality: N/A;  Excision of Condyloma Gluteal Cleft    ANAL RECTAL MANOMETRY N/A 03/26/2019   Procedure: ANO RECTAL MANOMETRY;  Surgeon: Thornton Park, MD;  [facility];  Service: Gastroenterology;  Laterality: N/A;   BACK SURGERY  2009   pt thinks it was an "interbody fusion"   BIOPSY  03/09/2019   Procedure: BIOPSY;  Surgeon: Thornton Park, MD;  Location: [facility];  Service: Gastroenterology;;   BOTOX INJECTION N/A 12/29/2018   Procedure: BOTOX INJECTION(100 UNITS)  WITH CYSTOSCOPY;  Surgeon: Ceasar Mons, MD;  [facility];  Service: Urology;  Laterality: N/A;   BOTOX INJECTION N/A 07/27/2019   Procedure:  BOTOX INJECTION WITH CYSTOSCOPY;  Surgeon: Ceasar Mons, MD;  [facility];  Service: Urology;  Laterality: N/A;  ONLY NEEDS 30 MIN   BREAST CYST EXCISION     bilateral breast, 3 cysts removed from each breast   BREAST EXCISIONAL BIOPSY Right    x 3   BREAST EXCISIONAL BIOPSY Left    x 3   CARPAL TUNNEL RELEASE Bilateral    Cervical biospy     CERVICAL FUSION  04/10/2011   x3- cervical fusion with plating and screws-Dr. [name]   COLONOSCOPY WITH PROPOFOL N/A 07/06/2015   Procedure: COLONOSCOPY WITH PROPOFOL;  Surgeon: [name], MD;  [facility];  Service: Endoscopy;  Laterality: N/A;   COLONOSCOPY WITH PROPOFOL N/A 03/09/2019   Procedure: COLONOSCOPY WITH PROPOFOL;  Surgeon: Thornton Park, MD;  [facility];  Service: Gastroenterology;  Laterality: N/A;   DOPPLER ECHOCARDIOGRAPHY  04/06/2012   AT [place] 55-60%   FOOT SURGERY Left 2018   FRACTURE SURGERY     little toe left foot   I & D EXTREMITY Left 12/18/2016   Procedure: IRRIGATION AND DEBRIDEMENT LEFT FOOT, CLOSURE;  Surgeon: [name], MD;  Location: [place];  Service: Orthopedics;  Laterality: Left;   INJECTION KNEE Right 10/03/2020   Procedure: KNEE INJECTION;   Surgeon: Jessy Oto, MD;  Location: [facility];  Service: Orthopedics;  Laterality: Right;   LAPAROSCOPIC GASTRIC SLEEVE RESECTION N/A 05/08/2020   Procedure: LAPAROSCOPIC GASTRIC SLEEVE RESECTION;  Surgeon: Kieth Brightly Arta Bruce, MD;  [facility];  Service: General;  Laterality: N/A;   [place] SURGERY  04/10/2011   x5-Lumbar fusion-retained hardware.(Dr. Louanne Skye)   LUMBAR LAMINECTOMY N/A 10/03/2020   Procedure: THORACOLUMBAR LAMINECTOMIES THORACIC ELEVEN-TWELVE, THORACIC TWELVE-LUMBAR ONE  AND LUMBAT ONE-TWO;  Surgeon: Jessy Oto, MD;  Location: [facility];  Service: Orthopedics;  Laterality: N/A;   MULTIPLE TOOTH EXTRACTIONS     POLYPECTOMY  03/09/2019   Procedure: POLYPECTOMY;  Surgeon: Thornton Park, MD;  [facility];  Service: Gastroenterology;;   SHOULDER ARTHROSCOPY WITH ROTATOR CUFF REPAIR AND SUBACROMIAL DECOMPRESSION Left 05/07/2019   Procedure: LEFT SHOULDER ARTHROSCOPY WITH DEBRIDEMENT, DISTAL CLAVICLE EXCISION,  SUBACROMIAL DECOMPRESSION AND POSSIBLE ROTATOR CUFF REPAIR;  Surgeon: [name], MD;  Location: [place];  Service: Orthopedics;  Laterality: Left;   TEE WITHOUT CARDIOVERSION N/A 04/06/2012   Procedure: TRANSESOPHAGEAL ECHOCARDIOGRAM (TEE);  Surgeon: [name], MD;  Location: [facility];  Service: Cardiovascular;  Laterality: N/A;   TRIGGER FINGER RELEASE Right    middle finger   TRIGGER FINGER RELEASE Left 06/28/2016   Procedure: RELEASE TRIGGER FINGER LEFT 3RD FINGER;  Surgeon: [name], MD;  Location: [place];  Service: Orthopedics;  Laterality: Left;   TRIGGER FINGER RELEASE Right 06/12/2016   Procedure: RIGHT INDEX FINGER TRIGGER RELEASE;  Surgeon: [name], MD;  Location: [place];  Service: Orthopedics;  Laterality: Right;   TRIGGER FINGER RELEASE Right 01/19/2018   Procedure: RIGHT RING FINGER TRIGGER FINGER RELEASE;  Surgeon: [name], MD;  Location: [place];  Service: Orthopedics;  Laterality: Right;   TRIGGER FINGER RELEASE  Left 05/07/2019   Procedure: RELEASE TRIGGER FINGER LEFT INDEX FINGER;  Surgeon: [name], MD;  Location: [place];  Service: Orthopedics;  Laterality: Left;   UPPER GI ENDOSCOPY N/A 05/08/2020   Procedure: UPPER GI ENDOSCOPY;  Surgeon: Kieth Brightly, Arta Bruce, MD;  [facility];  Service: General;  Laterality: N/A;   Social History  Occupational History   Occupation: disabled    Comment: Disabled  Tobacco Use   Smoking status: Some Days    Packs/day: 0.20    Years: 40.00    Pack years: 8.00    Types: Cigarettes    Start date: 01/28/1977   Smokeless tobacco: Never   Tobacco comments:    Smoked 2 ciggs on 07/17/2020 has not smoked since  Vaping Use   Vaping Use: Never used  Substance and Sexual Activity   Alcohol use: No    Alcohol/week: 0.0 standard drinks   Drug use: Not Currently    Types: Marijuana, "Crack" cocaine    Comment: hx marijuana usemany years ago, Crack -none since 11/2015   Sexual activity: Not Currently    Partners: Male    Birth control/protection: Post-menopausal

## 2020-12-05 ENCOUNTER — Ambulatory Visit: Payer: 59 | Admitting: Physical Therapy

## 2020-12-06 ENCOUNTER — Other Ambulatory Visit: Payer: Self-pay

## 2020-12-06 ENCOUNTER — Ambulatory Visit (INDEPENDENT_AMBULATORY_CARE_PROVIDER_SITE_OTHER): Payer: 59 | Admitting: Podiatry

## 2020-12-06 ENCOUNTER — Encounter: Payer: Self-pay | Admitting: Podiatry

## 2020-12-06 DIAGNOSIS — N184 Chronic kidney disease, stage 4 (severe): Secondary | ICD-10-CM | POA: Diagnosis not present

## 2020-12-06 DIAGNOSIS — B351 Tinea unguium: Secondary | ICD-10-CM | POA: Diagnosis not present

## 2020-12-06 DIAGNOSIS — M79674 Pain in right toe(s): Secondary | ICD-10-CM

## 2020-12-06 DIAGNOSIS — E1142 Type 2 diabetes mellitus with diabetic polyneuropathy: Secondary | ICD-10-CM | POA: Diagnosis not present

## 2020-12-06 DIAGNOSIS — M79675 Pain in left toe(s): Secondary | ICD-10-CM | POA: Diagnosis not present

## 2020-12-06 NOTE — Progress Notes (Deleted)
    SUBJECTIVE:   CHIEF COMPLAINT / HPI:   Difficulties ambulating Patient is s/p laminectomy and follows with orthopedics.  She was recently seen by orthopedics who recommended resting, weight loss, physical therapy.  They have scheduled her for physical therapy.  She previously had a Rollator walker but due to her weight it broke.  She is in need of heavy-duty walker.  PERTINENT  PMH / PSH: ***  OBJECTIVE:   LMP 04/10/2006   ***  ASSESSMENT/PLAN:   No problem-specific Assessment & Plan notes found for this encounter.     [name], MD [place]

## 2020-12-06 NOTE — Progress Notes (Signed)
This patient returns to my office for at risk foot care.  This patient requires this care by a professional since this patient will be at risk due to having chronic kidney disease and type 2 diabetes.  This patient is unable to cut nails herself since the patient cannot reach her nails.These nails are painful walking and wearing shoes.  This patient presents for at risk foot care today.  General Appearance  Alert, conversant and in no acute stress.  Vascular  Dorsalis pedis and posterior tibial  pulses are palpable  bilaterally.  Capillary return is within normal limits  bilaterally. Temperature is within normal limits  bilaterally.  Neurologic  Senn-Weinstein monofilament wire test diminished  bilaterally. Muscle power within normal limits bilaterally.  Nails Thick disfigured discolored nails with subungual debris  from hallux to fifth toes bilaterally. No evidence of bacterial infection or drainage bilaterally.  Orthopedic  No limitations of motion  feet .  No crepitus or effusions noted.  No bony pathology or digital deformities noted.  HAV  Left foot.  Skin  normotropic skin with no porokeratosis noted bilaterally.  No signs of infections or ulcers noted.     Onychomycosis  Pain in right toes  Pain in left toes  Consent was obtained for treatment procedures.   Mechanical debridement of nails 1-5  bilaterally performed with a nail nipper.  Filed with dremel without incident.    Return office visit   3 months                 Told patient to return for periodic foot care and evaluation due to potential at risk complications.   Deuntae Kocsis DPM   

## 2020-12-07 ENCOUNTER — Encounter: Payer: Self-pay | Admitting: Specialist

## 2020-12-07 ENCOUNTER — Ambulatory Visit: Payer: 59

## 2020-12-08 ENCOUNTER — Ambulatory Visit: Payer: 59 | Admitting: Family Medicine

## 2020-12-08 ENCOUNTER — Encounter: Payer: Self-pay | Admitting: Podiatry

## 2020-12-10 ENCOUNTER — Encounter: Payer: Self-pay | Admitting: Cardiology

## 2020-12-11 ENCOUNTER — Other Ambulatory Visit: Payer: Self-pay | Admitting: *Deleted

## 2020-12-11 DIAGNOSIS — B353 Tinea pedis: Secondary | ICD-10-CM

## 2020-12-11 DIAGNOSIS — E1151 Type 2 diabetes mellitus with diabetic peripheral angiopathy without gangrene: Secondary | ICD-10-CM

## 2020-12-11 MED ORDER — AMMONIUM LACTATE 12 % EX LOTN: TOPICAL_LOTION | CUTANEOUS | 3 refills | Status: DC

## 2020-12-13 ENCOUNTER — Encounter: Payer: Self-pay | Admitting: Family Medicine

## 2020-12-13 MED ORDER — ALBUTEROL SULFATE HFA 108 (90 BASE) MCG/ACT IN AERS: 2.0000 | INHALATION_SPRAY | Freq: Four times a day (QID) | RESPIRATORY_TRACT | 3 refills | Status: DC | PRN

## 2020-12-19 ENCOUNTER — Telehealth: Payer: Self-pay | Admitting: Pharmacist

## 2020-12-19 ENCOUNTER — Other Ambulatory Visit: Payer: Self-pay | Admitting: Family Medicine

## 2020-12-19 DIAGNOSIS — Z720 Tobacco use: Secondary | ICD-10-CM

## 2020-12-19 NOTE — Telephone Encounter (Signed)
Error message

## 2020-12-19 NOTE — Assessment & Plan Note (Signed)
Patient contacted for follow/up of tobacco intake reduction / cessation attempt.   Since last contact patient reports "no change" - she reports smoking 1 ppd    Medications currently being used; None  Patient reports continued back, leg and hip pain which are limiting her iADLs.   Most common triggers to use tobacco include; stress, boredom, pain and coping with health issues.    Motivation to quit: minimal at this time.    Total time with patient call and documentation of interaction: 16 minutes.  F/U Phone call planned: in 6-8 weeks per patient request/ agreement to follow-up and reassess interest in quitting at that time.

## 2020-12-19 NOTE — Telephone Encounter (Signed)
-----   Message from Leavy Cella, [place] sent at 11/16/2020 11:25 AM EDT ----- Regarding: Tobacco Cessation - set a quit date - ? restart BRAND Name Chantix?

## 2020-12-19 NOTE — Telephone Encounter (Signed)
Patient contacted for follow/up of tobacco intake reduction / cessation attempt.   Since last contact patient reports "no change" - she reports smoking 1 ppd    Medications currently being used; None  Patient reports continued back, leg and hip pain which are limiting her iADLs.   Most common triggers to use tobacco include; stress, boredom, pain and coping with health issues.    Motivation to quit: minimal at this time.    Total time with patient call and documentation of interaction: 16 minutes.  F/U Phone call planned: in 6-8 weeks per patient request/ agreement to follow-up and reassess interest in quitting at that time.

## 2020-12-19 NOTE — Telephone Encounter (Signed)
Noted and agree. 

## 2020-12-20 ENCOUNTER — Encounter: Payer: 59 | Attending: General Surgery | Admitting: Skilled Nursing Facility1

## 2020-12-20 ENCOUNTER — Other Ambulatory Visit: Payer: Self-pay | Admitting: Family Medicine

## 2020-12-20 ENCOUNTER — Ambulatory Visit: Payer: 59 | Admitting: Skilled Nursing Facility1

## 2020-12-20 DIAGNOSIS — N184 Chronic kidney disease, stage 4 (severe): Secondary | ICD-10-CM | POA: Insufficient documentation

## 2020-12-20 DIAGNOSIS — Z1231 Encounter for screening mammogram for malignant neoplasm of breast: Secondary | ICD-10-CM

## 2020-12-20 NOTE — Progress Notes (Signed)
Bariatric Nutrition Follow-Up Visit Medical Nutrition Therapy   Surgery Date: 05/08/2020  Pts appt conducted via the telephone pt agreed to this appt visit type; pt identified by name and DOB  NUTRITION ASSESSMENT    Anthropometrics  Start weight at [facility]: 405.6 lbs (date: 07/08/2018) Today's weight: pt states 304.6 pounds  Body Composition Scale 07/03/2020  Weight  lbs 329.8  Total Body Fat  % 49.6     Visceral Fat 22  Fat-Free Mass  % 50.3     Total Body Water  % 39.6     Muscle-Mass  lbs 35  BMI 48.5  Body Fat Displacement ---        Torso  lbs 101.6        Left Leg  lbs 20.3        Right Leg  lbs 20.3        Left Arm  lbs 10.1        Right Arm  lbs 10.1   Clinical  Medical hx: HTN, Sleep apnea, GERD, DM Medications: Humalog only in the morning Labs: Lipid panel WNL   Lifestyle & Dietary Hx   Ever since spinal decompression November 6th she has been in a lot of pain stating this is normal as she heals.  Pt states her blood sugars have been good: 93-158. Pt states she has been having a friend move in with her to take care of her stating he does not cook like she does.  Pt does not complain of any hypoglycemia symptoms or dehydration symptoms.  Pt states she fell on the way to her doctor appointment an the EMS helped her back up.  Pt states she has been doing really good with drinking her water.    Estimated daily fluid intake: 64 oz Estimated daily protein intake: 60 g Supplements: calcium and multi Current average weekly physical activity: ADLs due to back pain   24-Hr Dietary Recall: continued First Meal: fried egg + cheese + sausage patty Snack: protein water Second Meal: cucumber and lettuce + [place] meatloaf + [place] dressing + garlic powder + vinegar Snack:  Third Meal: chicken wings or cucumber and lettuce + [place] meatloaf Snack: protein shake to take medicine  Beverages: water, protein water, sugar free twist, almond milk  Post-Op Goals/ Signs/  Symptoms Using straws: no Drinking while eating: no Chewing/swallowing difficulties: no Changes in vision: no Changes to mood/headaches: no Hair loss/changes to skin/nails: no Difficulty focusing/concentrating: no Sweating: no Dizziness/lightheadedness: no Palpitations: no  Carbonated/caffeinated beverages: no N/V/D/C/Gas: no Abdominal pain: no Dumping syndrome: no    NUTRITION DIAGNOSIS  Overweight/obesity ([place]-3.3) related to past poor dietary habits and physical inactivity as evidenced by completed bariatric surgery and following dietary guidelines for continued weight loss and healthy nutrition status.     NUTRITION INTERVENTION Nutrition counseling (C-1) and education (E-2) to facilitate bariatric surgery goals, including: The importance of consuming adequate calories as well as certain nutrients daily due to the body's need for essential vitamins, minerals, and fats The importance of daily physical activity and to reach a goal of at least 150 minutes of moderate to vigorous physical activity weekly (or as directed by their physician) due to benefits such as increased musculature and improved lab values The importance of intuitive eating specifically learning hunger-satiety cues and understanding the importance of learning a new body: The importance of mindful eating to avoid grazing behaviors   Handouts Provided Include  Phase 6  Learning Style & Readiness for Change Teaching method  utilized: Optician, dispensing  Demonstrated degree of understanding via: Teach Back  Readiness Level: Action Barriers to learning/adherence to lifestyle change: back pain  RD's Notes for Next Visit Assess adherence to pt chosen goals   MONITORING & EVALUATION Dietary intake, weekly physical activity, body weight  Next Steps Patient is to follow-up in 3-4 months

## 2020-12-23 ENCOUNTER — Other Ambulatory Visit: Payer: Self-pay | Admitting: Family Medicine

## 2020-12-26 ENCOUNTER — Encounter: Payer: Self-pay | Admitting: Specialist

## 2020-12-28 HISTORY — PX: CATARACT EXTRACTION W/ INTRAOCULAR LENS IMPLANT: SHX1309

## 2020-12-29 ENCOUNTER — Ambulatory Visit: Payer: 59 | Admitting: Specialist

## 2021-01-01 ENCOUNTER — Ambulatory Visit: Payer: 59 | Admitting: Specialist

## 2021-01-03 NOTE — Progress Notes (Signed)
Cardiology Office Note:    Date:  12/06/4172   ID:  [name], DOB [birth year], MRN [id number]  PCP:  [name], MD  Cardiologist:  [name], MD PhD  Referring MD: [name], MD   CC: follow up  History of Present Illness:    [name] is a 55 y.o. female with chronic diastolic heart failure, chronic hypoxic respiratory failure, COPD, OSA on PAP, morbid obesity, paroxysmal SVT, type II diabetes on insulin, CKD stage 3, hypertension, hyperlipidemia here today for follow-up. I initially met her 10/27/2017.   Cardiac history: Complex, see my initial notes. Has struggled with diastolic heart failure, has chest pain when she gets fluid overloaded. Was on oxygen, having "breathing crises" that seemed to be related to paroxysmal SVT. She was well controlled for many months, then had an episode requiring hospitalization 08/2018 for chest pain and elevated weight. HsTn unremarkable (6 -> 3), no SVT on monitor. BNP 41.7. Covid negative. Was diuresed, lost fluid, swelling on left side went down while she was laying down but came back when she got back up. She attributes the episode to a myelogram, gained 16 lbs and started having symptoms. Underwent laparoscopic sleeve gastrectomy 04/2020 and has improved since then.  Today: She is doing well. She underwent cataract surgery on her L eye yesterday and is recovering well. She has surgery for her R eye in the following months  On 9/6, she underwent spinal decompression. She reports having experienced severe nerve pain, primarily along her bilateral LE. She describes the pain as if "someone was stabbing her". The pain medications she was given worsened the nerve pain and she was unable to move her legs. She sent her son to the hospital to pick up a TENS unit for the pain. However, the hospital removed the device because it was not being used. The whole situation frustrated her because she felt she was being treated poorly.  She followed-up with Benjiman Core, PA on 10/26/20. Her pain persisted but was improved.  Her R leg typically swells more than her L leg. She has noticed more swelling after her surgeries and eating.  Recently, she has been trying to get back to walking. She reports she will be starting physical therapy 02/07/21. She intends to go to the [facility] and exercise in the pool.   She reports of a dry cough that can turn to gagging.   She denies any palpitations, chest pain, shortness of breath, lightheadedness, headaches, syncope, orthopnea, PND, or exertional symptoms.   Past Medical History:  Diagnosis Date   Alcoholism ([place])    Anxiety    Arthritis 04-10-11   hips, shoulders, back   Asthma    Bipolar disorder ([place])    Cardiomegaly    Carpal tunnel syndrome, bilateral    Cervical cancer ([place]) 1993   cervical, no treatment done, went away per pt   Cervical dysplasia or atypia 04-10-11   '93- once dx.-got pregnant-no intervention, then postpartum, no dysplasia found   CHF (congestive heart failure) ([place])    no cardiologist 2014 dx, none now   Chronic hypoxemic respiratory failure (HCC)    Chronic kidney disease    stage 3 kidney disease   Condyloma - gluteal cleft 04/09/2011   Removed by general surgery. Pathology showed Condyloma, gluteal CONDYLOMA ACUMINATUM.    COPD (chronic obstructive pulmonary disease) (HCC)    Depression    Diabetes mellitus without complication (HCC)    Dyspnea    with actity, sitting  Fall 12/16/2016   Fibrocystic breast disease    GERD (gastroesophageal reflux disease)    Grade I diastolic dysfunction 53/64/6803   Noted on ECHO   Hepatitis    hep B-count is low at present,doesn't register   History of PSVT (paroxysmal supraventricular tachycardia)    Hypercalcemia    Hyperlipidemia    Hypertension    Incomplete left bundle branch block (LBBB) 09/24/2018   Noted on EKG   Lung nodule 11/2017   right and left lung   Migraine headache    none recent    Morbid obesity ([place]) 03/27/2006   Neuropathy    Pneumonia    walking pneu 15 yrs. ago   Skin lesion 03/15/2011   In gluteal crease now s/p removal by Dr. [name] of General Surgery on 3/12. Path shows condyloma.      Sleep apnea 04-10-11   uses cpap, pt does not know settings   Trigger finger    left third   Urge incontinence of urine     Past Surgical History:  Procedure Laterality Date   ANAL FISTULECTOMY  04/17/2011   Procedure: FISTULECTOMY ANAL;  Surgeon: Imogene Burn. [name], MD;  Location: [facility];  Service: General;  Laterality: N/A;  Excision of Condyloma Gluteal Cleft    ANAL RECTAL MANOMETRY N/A 03/26/2019   Procedure: ANO RECTAL MANOMETRY;  Surgeon: Thornton Park, MD;  [facility]: [facility];  Service: Gastroenterology;  Laterality: N/A;   BACK SURGERY  2009   pt thinks it was an "interbody fusion"   BIOPSY  03/09/2019   Procedure: BIOPSY;  Surgeon: Thornton Park, MD;  Location: [facility];  Service: Gastroenterology;;   BOTOX INJECTION N/A 12/29/2018   Procedure: BOTOX INJECTION(100 UNITS)  WITH CYSTOSCOPY;  Surgeon: Ceasar Mons, MD;  Location: [facility];  Service: Urology;  Laterality: N/A;   BOTOX INJECTION N/A 07/27/2019   Procedure: BOTOX INJECTION WITH CYSTOSCOPY;  Surgeon: Ceasar Mons, MD;  Location: [facility];  Service: Urology;  Laterality: N/A;  ONLY NEEDS 30 MIN   BREAST CYST EXCISION     bilateral breast, 3 cysts removed from each breast   BREAST EXCISIONAL BIOPSY Right    x 3   BREAST EXCISIONAL BIOPSY Left    x 3   CARPAL TUNNEL RELEASE Bilateral    Cervical biospy     CERVICAL FUSION  04/10/2011   x3- cervical fusion with plating and screws-Dr. [name]   COLONOSCOPY WITH PROPOFOL N/A 07/06/2015   Procedure: COLONOSCOPY WITH PROPOFOL;  Surgeon: [name], MD;  [facility];  Service: Endoscopy;  Laterality: N/A;   COLONOSCOPY WITH PROPOFOL N/A 03/09/2019   Procedure: COLONOSCOPY WITH PROPOFOL;  Surgeon: Thornton Park, MD;  [facility];  Service: Gastroenterology;  Laterality: N/A;   DOPPLER ECHOCARDIOGRAPHY  04/06/2012   AT [place] 55-60%   FOOT SURGERY Left 2018   FRACTURE SURGERY     little toe left foot   I & D EXTREMITY Left 12/18/2016   Procedure: IRRIGATION AND DEBRIDEMENT LEFT FOOT, CLOSURE;  Surgeon: [name], MD;  Location: [place];  Service: Orthopedics;  Laterality: Left;   INJECTION KNEE Right 10/03/2020   Procedure: KNEE INJECTION;  Surgeon: Jessy Oto, MD;  Location: [place];  Service: Orthopedics;  Laterality: Right;   LAPAROSCOPIC GASTRIC SLEEVE RESECTION N/A 05/08/2020   Procedure: LAPAROSCOPIC GASTRIC SLEEVE RESECTION;  Surgeon: Kieth Brightly Arta Bruce, MD;  Location: [facility];  Service: General;  Laterality: N/A;   [place]  04/10/2011   x5-Lumbar fusion-retained hardware.(Dr. Louanne Skye)   LUMBAR LAMINECTOMY N/A 10/03/2020   Procedure: THORACOLUMBAR LAMINECTOMIES THORACIC ELEVEN-TWELVE, THORACIC TWELVE-LUMBAR ONE  AND LUMBAT ONE-TWO;  Surgeon: Jessy Oto, MD;  Location: [place];  Service: Orthopedics;  Laterality: N/A;   MULTIPLE TOOTH EXTRACTIONS     POLYPECTOMY  03/09/2019   Procedure: POLYPECTOMY;  Surgeon: Thornton Park, MD;  [facility];  Service: Gastroenterology;;   SHOULDER ARTHROSCOPY WITH ROTATOR CUFF REPAIR AND SUBACROMIAL DECOMPRESSION Left 05/07/2019   Procedure: LEFT SHOULDER ARTHROSCOPY WITH DEBRIDEMENT, DISTAL CLAVICLE EXCISION,  SUBACROMIAL DECOMPRESSION AND POSSIBLE ROTATOR CUFF REPAIR;  Surgeon: [name], MD;  Location: [place];  Service: Orthopedics;  Laterality: Left;   TEE WITHOUT CARDIOVERSION N/A 04/06/2012   Procedure: TRANSESOPHAGEAL ECHOCARDIOGRAM (TEE);  Surgeon: [name], MD;  Location: [facility];  Service: Cardiovascular;  Laterality: N/A;   TRIGGER FINGER RELEASE Right    middle finger   TRIGGER FINGER RELEASE Left 06/28/2016   Procedure: RELEASE TRIGGER FINGER LEFT 3RD FINGER;  Surgeon:  [name], MD;  Location: [place];  Service: Orthopedics;  Laterality: Left;   TRIGGER FINGER RELEASE Right 06/12/2016   Procedure: RIGHT INDEX FINGER TRIGGER RELEASE;  Surgeon: [name], MD;  Location: [place];  Service: Orthopedics;  Laterality: Right;   TRIGGER FINGER RELEASE Right 01/19/2018   Procedure: RIGHT RING FINGER TRIGGER FINGER RELEASE;  Surgeon: [name], MD;  Location: [place];  Service: Orthopedics;  Laterality: Right;   TRIGGER FINGER RELEASE Left 05/07/2019   Procedure: RELEASE TRIGGER FINGER LEFT INDEX FINGER;  Surgeon: [name], MD;  Location: [place];  Service: Orthopedics;  Laterality: Left;   UPPER GI ENDOSCOPY N/A 05/08/2020   Procedure: UPPER GI ENDOSCOPY;  Surgeon: Kieth Brightly, Arta Bruce, MD;  Location: [facility];  Service: General;  Laterality: N/A;    Current Medications: Current Outpatient Medications on File Prior to Visit  Medication Sig   ACCU-CHEK AVIVA PLUS test strip 1 EACH BY OTHER ROUTE 3 (THREE) TIMES DAILY. TEST 3 TIMES DAILY   Accu-Chek Softclix Lancets lancets USE TO TEST 4 TIMES A DAY   acetaminophen (TYLENOL) 500 MG tablet Take 1,000 mg by mouth 2 (two) times daily.   albuterol (VENTOLIN HFA) 108 (90 Base) MCG/ACT inhaler Inhale 2 puffs into the lungs every 6 (six) hours as needed for wheezing or shortness of breath.   atorvastatin (LIPITOR) 40 MG tablet TAKE 1 TABLET BY MOUTH EVERY DAY (Patient taking differently: Take 40 mg by mouth in the morning.)   b complex vitamins capsule Take 1 capsule by mouth daily.   B-D UF III MINI PEN NEEDLES 31G X 5 MM MISC CHECK SUGARS 4 TIMES A DAY BEFORE MEALS AND AT BEDTIME.   baclofen (LIORESAL) 10 MG tablet Take 1 tablet (10 mg total) by mouth 3 (three) times daily as needed for muscle spasms. (Patient taking differently: Take 10 mg by mouth 3 (three) times daily.)   blood glucose meter kit and supplies KIT Dispense based on patient and insurance preference. Use up to four times daily as directed. (FOR ICD-9  250.00, 250.01).   busPIRone (BUSPAR) 10 MG tablet TAKE 1 TABLET BY MOUTH THREE TIMES A DAY   calcium carbonate (OSCAL) 1500 (600 Ca) MG TABS tablet Take 1,500 mg by mouth 3 (three) times daily with meals.   diclofenac Sodium (VOLTAREN) 1 % GEL APPLY 2 GRAMS TO AFFECTED AREA 4 TIMES A DAY   diltiazem (CARDIZEM CD) 120 MG 24 hr  capsule TAKE 1 CAPSULE (120 MG TOTAL) BY MOUTH 2 (TWO) TIMES DAILY.   docusate sodium (COLACE) 100 MG capsule Take 200 mg by mouth 2 (two) times daily.   fluticasone (FLONASE) 50 MCG/ACT nasal spray PLACE 2 SPRAYS INTO BOTH NOSTRILS TWICE DAILY   Fluticasone-Umeclidin-Vilant (TRELEGY ELLIPTA) 100-62.5-25 MCG/INH AEPB Inhale 1 puff into the lungs daily.   gabapentin (NEURONTIN) 300 MG capsule Take 2 capsules (600 mg total) by mouth 3 (three) times daily. TAKE 2 CAPSULES (600 MG TOTAL) BY MOUTH in the morning and at lunch and take 3 capsules at night   hydrOXYzine (ATARAX/VISTARIL) 10 MG tablet Take 1 tablet (10 mg total) by mouth daily.   insulin lispro (HUMALOG KWIKPEN) 100 UNIT/ML KwikPen Inject 3-4 Units into the skin 3 (three) times daily.   ketoconazole (NIZORAL) 2 % cream APPLY 1 FINGERTIP AMOUNT TO EACH FOOT DAILY. (Patient taking differently: Apply 1 application topically in the morning, at noon, and at bedtime.)   ketorolac (ACULAR) 0.5 % ophthalmic solution Place 1 drop into both eyes in the morning, at noon, and at bedtime.   loratadine (CLARITIN) 10 MG tablet Take 1 tablet (10 mg total) by mouth daily.   losartan (COZAAR) 100 MG tablet Take 1 tablet (100 mg total) by mouth daily.   metolazone (ZAROXOLYN) 2.5 MG tablet TAKE 1 TABLET EVERY OTHER DAY. TAKE 1 TABLET TWICE WEEKLY ON MONDAYS AND THURSDAYS ONLY   montelukast (SINGULAIR) 10 MG tablet TAKE 1 TABLET BY MOUTH EVERYDAY AT BEDTIME   Multiple Vitamins-Minerals (BARIATRIC MULTIVITAMINS/IRON) CAPS Take 1 capsule by mouth daily.   NARCAN 4 MG/0.1ML LIQD nasal spray kit Place 1 spray into the nose as needed  (accidental overdose).   NON FORMULARY Uses a C-PAP at bedtime   nystatin (MYCOSTATIN) 100000 UNIT/ML suspension TAKE 5 MLS (500,000 UNITS TOTAL) BY MOUTH 4 (FOUR) TIMES DAILY. SWISH AND SWALLOW.   ofloxacin (OCUFLOX) 0.3 % ophthalmic solution Place 1 drop into both eyes in the morning, at noon, and at bedtime.   omeprazole (PRILOSEC) 20 MG capsule TAKE 1 CAPSULE BY MOUTH EVERY DAY   oxyCODONE (OXY IR/ROXICODONE) 5 MG immediate release tablet Take 1-3 tablets (5-15 mg total) by mouth every 4 (four) hours as needed for severe pain.   prednisoLONE acetate (PRED FORTE) 1 % ophthalmic suspension Place 1 drop into both eyes in the morning, at noon, and at bedtime.   venlafaxine XR (EFFEXOR-XR) 75 MG 24 hr capsule TAKE 1 CAPSULE BY MOUTH DAILY WITH BREAKFAST.   allopurinol (ZYLOPRIM) 300 MG tablet Take 300 mg by mouth in the morning. (Patient not taking: Reported on 01/04/2021)   ammonium lactate ([employer] SKIN TREATMENT) 12 % lotion APPLY TWICE A DAY AS NEEDED FOR DRY SKIN (Patient not taking: Reported on 01/04/2021)   methocarbamol (ROBAXIN) 500 MG tablet Take 1 tablet (500 mg total) by mouth every 6 (six) hours as needed for muscle spasms. (Patient not taking: Reported on 01/04/2021)   Potassium Chloride ER 20 MEQ TBCR Take 20 mEq by mouth daily. (Patient not taking: Reported on 01/04/2021)   Current Facility-Administered Medications on File Prior to Visit  Medication   technetium tetrofosmin (TC-MYOVIEW) injection 47.4 millicurie     Allergies:   Aspirin, Methadone hcl, Corticosteroids, and Levofloxacin   Social History   Tobacco Use   Smoking status: Some Days    Packs/day: 0.20    Years: 40.00    Pack years: 8.00    Types: Cigarettes    Start date: 01/28/1977   Smokeless tobacco:  Never   Tobacco comments:    Smoked 2 ciggs on 07/17/2020 has not smoked since  Vaping Use   Vaping Use: Never used  Substance Use Topics   Alcohol use: No    Alcohol/week: 0.0 standard drinks   Drug use: Not  Currently    Types: Marijuana, "Crack" cocaine    Comment: hx marijuana usemany years ago, Crack -none since 11/2015    Family History: The patient's family history includes Alcohol abuse in her father; Arthritis in her sister; Asthma in her sister; Breast cancer in her maternal aunt; Depression in her sister; Diabetes in an other family member; Hypertension in her father and another family member; Pneumonia in her father; Pulmonary embolism in her mother; Sickle cell anemia in her son; Stroke in her father. no CAD known. Father died age 49 from stroke, mother died age 67 of blood clot. Has a 92 year old child with sickle cell disease.  ROS:   Please see the history of present illness.   (+) Nerve pain (+) LE swelling (R>L) (+) Coughing Additional pertinent ROS otherwise unremarkable.  EKGs/Labs/Other Studies Reviewed:    The following studies were reviewed today: Stress test 04/13/19 The left ventricular ejection fraction is mildly decreased (45-54%). Nuclear stress EF: 47%. There was no ST segment deviation noted during stress. This is an intermediate risk study due to reduced systolic function. There is no ischemia. New wall motion abnormalities noted. Consider echo if clinically warranted.  Monitor 12/18/17 3 days of rhythm interpreted on Zio patch. Patient had a min HR of 58 bpm, max HR of 193 bpm, and avg HR of 81 bpm. Predominant underlying rhythm was Sinus Rhythm. 217 Supraventricular Tachycardia runs occurred, the run with the fastest interval lasting 6 beats with a max rate of 193 bpm, the longest lasting 1 min 13 secs with an avg rate of 151 bpm. True duration of Supraventricular Tachycardia difficult to ascertain due to artifact. Thirteen triggered patient events recorded. Supraventricular Tachycardia was detected within +/- 45 seconds of symptomatic patient event(s). Isolated SVEs were occasional (2.0%, 6813), SVE Couplets were rare (<1.0%, 235), and SVE Triplets were rare  (<1.0%, 66). No significant ventricular ectopy noted.  Echo 9.4.19 Study Conclusions - Left ventricle: The cavity size was normal. Wall thickness was   normal. Systolic function was normal. The estimated ejection   fraction was in the range of 55% to 60%. Wall motion was normal;   there were no regional wall motion abnormalities. Doppler   parameters are consistent with abnormal left ventricular   relaxation (grade 1 diastolic dysfunction).  Lexiscan MPI 11/11/17 Study Highlights  Nuclear stress EF: 71%. The left ventricular ejection fraction is hyperdynamic (>65%). There was no ST segment deviation noted during stress. There is a small defect of mild severity present in the apical septal location. The defect is non-reversible. No ischemia noted. This is a low risk study.   EKG:  EKG is personally reviewed today.    01/04/21: not ordered today 07/06/19: normal sinus rhythm at 77 bpm  Recent Labs: 09/25/2020: ALT 30 10/07/2020: BUN 16; Creatinine, Ser 0.89; Hemoglobin 13.3; Platelets 256; Potassium 4.1; Sodium 138  Recent Lipid Panel    Component Value Date/Time   CHOL 142 06/28/2020 1004   TRIG 130 06/28/2020 1004   HDL 46 06/28/2020 1004   CHOLHDL 3.1 06/28/2020 1004   CHOLHDL 3.9 09/24/2018 2336   VLDL 33 09/24/2018 2336   LDLCALC 73 06/28/2020 1004   LDLDIRECT 114 (H) 09/15/2012 1559  Physical Exam:    VS:  BP (!) 146/88   Pulse 93   Ht _0  (1.753 m)   Wt (!) 305 lb (138.3 kg)   LMP 04/10/2006   SpO2 91%   BMI 45.04 kg/m     Wt Readings from Last 3 Encounters:  01/04/21 (!) 305 lb (138.3 kg)  11/30/20 (!) 313 lb (142 kg)  10/26/20 (!) 313 lb (142 kg)   GEN: Well nourished, well developed in no acute distress. HEENT: Normal, moist mucous membranes NECK: No JVD visible CARDIAC: regular rhythm, normal S1 and S2, no rubs or gallops. No murmur. VASCULAR: Radial and DP pulses 2+ bilaterally. No carotid bruits RESPIRATORY:  clear to auscultation without rales,  wheezing or rhonchi  ABDOMEN: Soft, non-tender, non-distended MUSCULOSKELETAL:  Ambulates independently with rollator SKIN: Warm and dry, 1+ LLE edema NEUROLOGIC:  Alert and oriented x 3. No focal neuro deficits noted. PSYCHIATRIC:  Normal affect    ASSESSMENT:    1. Morbid obesity ([place])   2. Essential hypertension   3. Chronic diastolic CHF (congestive heart failure) ([place])   4. Type 2 diabetes mellitus with stage 3a chronic kidney disease, with long-term current use of insulin (HCC)   5. Paroxysmal SVT (supraventricular tachycardia) (HCC)   6. Pure hypercholesterolemia   7. Counseling on health promotion and disease prevention     PLAN:    Chronic diastolic heart failure: stable NYHA class II symptoms today. Weights decreasing post gastric surgery and lifestyle change -prior regimen of furosemide 80 mg BID and metolazone 2.5 mg twice weekly (with potassium), now PRN -very strict on diet, daily weights, monitoring for symptoms.    Paroxysmal SVT: no recent symptoms -continue diltiazem   Type II diabetes with stage 3a CKD: on insulin and jardiance -insulin dose has decreased significantly post surgery and weight loss, congratulated   Hypertension:  elevated today -she will continue to monitor. Goal <130/80.  -continue dilitazem, losartan   Hypercholesterolemia: continue atorvastatin, lipids as above   Class 3 obesity, BMI 59-->45: doing an excellent job with weight loss. Peak weight nearly 420 lbs, lost 57 lbs prior to surgery and has continued to lose weight since bariatric surgery 04/2020. Congratulated her.   OSA on PAP, obesity hypoventilation syndrome: followed by pulmonology, no longer on home O2  Prevention and cardiac disease counseling, including heart failure education:  The 10-year ASCVD risk score (Arnett DK, et al., 2019) is: 30.1%   Values used to calculate the score:     Age: 16 years     Sex: Female     Is Non-Hispanic African American: Yes     Diabetic:  Yes     Tobacco smoker: Yes     Systolic Blood Pressure: 202 mmHg     Is BP treated: Yes     HDL Cholesterol: 46 mg/dL     Total Cholesterol: 142 mg/dL  Plan for follow up: 6 months   [name], MD, PhD [place]  [facility]   Medication Adjustments/Labs and Tests Ordered: Current medicines are reviewed at length with the patient today.  Concerns regarding medicines are outlined above.  No orders of the defined types were placed in this encounter.  No orders of the defined types were placed in this encounter.   Patient Instructions  Medication Instructions:  Your Physician recommend you continue on your current medication as directed.    *If you need a refill on your cardiac medications before your next appointment, please call your pharmacy*  Lab Work: None ordered today   Testing/Procedures: None ordered today   Follow-Up: At [facility], you and your health needs are our priority.  As part of our continuing mission to provide you with exceptional heart care, we have created designated Provider Care Teams.  These Care Teams include your primary Cardiologist (physician) and Advanced Practice Providers (APPs -  Physician Assistants and Nurse Practitioners) who all work together to provide you with the care you need, when you need it.  We recommend signing up for the patient portal called "MyChart".  Sign up information is provided on this After Visit Summary.  MyChart is used to connect with patients for Virtual Visits (Telemedicine).  Patients are able to view lab/test results, encounter notes, upcoming appointments, etc.  Non-urgent messages can be sent to your provider as well.   To learn more about what you can do with MyChart, go to [email or web address].    Your next appointment:   6 month(s)  The format for your next appointment:   In Person  Provider:   [name], MD      Wilhemina Bonito as a scribe for [name], MD.,have documented all relevant documentation on the behalf of [name], MD,as directed by  [name], MD while in the presence of [name], MD.  I, [name], MD, have reviewed all documentation for this visit. The documentation on 02/15/21 for the exam, diagnosis, procedures, and orders are all accurate and complete.   Signed, [name], MD PhD 01/04/2021  [place]

## 2021-01-04 ENCOUNTER — Other Ambulatory Visit: Payer: Self-pay

## 2021-01-04 ENCOUNTER — Ambulatory Visit (INDEPENDENT_AMBULATORY_CARE_PROVIDER_SITE_OTHER): Payer: 59 | Admitting: Cardiology

## 2021-01-04 ENCOUNTER — Encounter (HOSPITAL_BASED_OUTPATIENT_CLINIC_OR_DEPARTMENT_OTHER): Payer: Self-pay | Admitting: Cardiology

## 2021-01-04 DIAGNOSIS — E1122 Type 2 diabetes mellitus with diabetic chronic kidney disease: Secondary | ICD-10-CM | POA: Diagnosis not present

## 2021-01-04 DIAGNOSIS — E7800 Pure hypercholesterolemia, unspecified: Secondary | ICD-10-CM

## 2021-01-04 DIAGNOSIS — I5032 Chronic diastolic (congestive) heart failure: Secondary | ICD-10-CM | POA: Diagnosis not present

## 2021-01-04 DIAGNOSIS — I471 Supraventricular tachycardia: Secondary | ICD-10-CM

## 2021-01-04 DIAGNOSIS — Z7189 Other specified counseling: Secondary | ICD-10-CM

## 2021-01-04 DIAGNOSIS — I10 Essential (primary) hypertension: Secondary | ICD-10-CM

## 2021-01-04 DIAGNOSIS — Z794 Long term (current) use of insulin: Secondary | ICD-10-CM

## 2021-01-04 DIAGNOSIS — N1831 Chronic kidney disease, stage 3a: Secondary | ICD-10-CM

## 2021-01-04 NOTE — Patient Instructions (Signed)
Medication Instructions:  ?Your Physician recommend you continue on your current medication as directed.   ? ?*If you need a refill on your cardiac medications before your next appointment, please call your pharmacy* ? ? ?Lab Work: ?None ordered today ? ? ?Testing/Procedures: ?None ordered today ? ? ?Follow-Up: ?At [facility], you and your health needs are our priority.  As part of our continuing mission to provide you with exceptional heart care, we have created designated Provider Care Teams.  These Care Teams include your primary Cardiologist (physician) and Advanced Practice Providers (APPs -  Physician Assistants and Nurse Practitioners) who all work together to provide you with the care you need, when you need it. ? ?We recommend signing up for the patient portal called "MyChart".  Sign up information is provided on this After Visit Summary.  MyChart is used to connect with patients for Virtual Visits (Telemedicine).  Patients are able to view lab/test results, encounter notes, upcoming appointments, etc.  Non-urgent messages can be sent to your provider as well.   ?To learn more about what you can do with MyChart, go to [email or web address].   ? ?Your next appointment:   ?6 month(s) ? ?The format for your next appointment:   ?In Person ? ?Provider:   ?Bridgette Christopher, MD{ ? ? ? ?

## 2021-01-05 ENCOUNTER — Encounter: Payer: Self-pay | Admitting: Family Medicine

## 2021-01-09 ENCOUNTER — Other Ambulatory Visit: Payer: Self-pay

## 2021-01-09 ENCOUNTER — Other Ambulatory Visit: Payer: Self-pay | Admitting: Family Medicine

## 2021-01-09 ENCOUNTER — Ambulatory Visit (INDEPENDENT_AMBULATORY_CARE_PROVIDER_SITE_OTHER): Payer: 59 | Admitting: Family Medicine

## 2021-01-09 ENCOUNTER — Encounter: Payer: Self-pay | Admitting: Family Medicine

## 2021-01-09 VITALS — BP 132/84 | HR 103 | Ht 69.0 in | Wt 299.0 lb

## 2021-01-09 DIAGNOSIS — E1122 Type 2 diabetes mellitus with diabetic chronic kidney disease: Secondary | ICD-10-CM

## 2021-01-09 DIAGNOSIS — N1831 Chronic kidney disease, stage 3a: Secondary | ICD-10-CM

## 2021-01-09 DIAGNOSIS — I10 Essential (primary) hypertension: Secondary | ICD-10-CM | POA: Diagnosis not present

## 2021-01-09 DIAGNOSIS — Z23 Encounter for immunization: Secondary | ICD-10-CM

## 2021-01-09 DIAGNOSIS — L89892 Pressure ulcer of other site, stage 2: Secondary | ICD-10-CM | POA: Diagnosis not present

## 2021-01-09 DIAGNOSIS — Z794 Long term (current) use of insulin: Secondary | ICD-10-CM | POA: Diagnosis not present

## 2021-01-09 DIAGNOSIS — R262 Difficulty in walking, not elsewhere classified: Secondary | ICD-10-CM

## 2021-01-09 LAB — POCT GLYCOSYLATED HEMOGLOBIN (HGB A1C): HbA1c, POC (controlled diabetic range): 6.7 % (ref 0.0–7.0)

## 2021-01-09 NOTE — Progress Notes (Signed)
SUBJECTIVE:   CHIEF COMPLAINT / HPI:   Diabetes check up  Patient reports her blood sugars have been wonderful.  She is taking 3 to 4 units of mealtime coverage occasionally but does not take it regularly.  She reports that since her gastric bypass surgery her sugars have been greatly improved.  Most recent hemoglobin A1c was 6.8.  Groin wound Patient reports that for approximately a month she has had a wound in her groin.  She reports this started out as a blackhead which she popped.  After popping it became itchy and she would scratch until the scab would come off.  The wound has continued to persist and she has been using Neosporin ointment but no other treatments.  Denies any fevers or redness around the wound.  HTN Patient reports that her blood pressure has been doing well she thinks but she has noticed some higher blood pressure readings when she goes to her doctor's office.  She is compliant with her medications.  Recently saw cardiology who acknowledge she had an elevated blood pressure initially but on recheck it had improved.  He kept medications at the same levels.  Ambulation concerns Patient continues to have difficulty ambulating.  She reports that since her back surgery she is improving but is still requiring a walker.  Her walker is broken with her wheel stuck sideways and the break is broken off the left handle.  She needs a new "heavy-duty walker".    OBJECTIVE:   BP 132/84    Pulse (!) 103    Ht 5\' 9"  (1.753 m)    Wt 299 lb (135.6 kg)    LMP 04/10/2006    SpO2 99%    BMI 44.15 kg/m   General: Well-appearing 55 year old female, become short of breath with ambulation using Rollator walker with a broken wheel. Cardiac: Regular rate and rhythm, no murmurs appreciated respiratory: Normal work of breathing, lungs clear to auscultation bilaterally Derm: Patient has 1 cm x 2.5 cm lesion to her right groin inferior and lateral to right labia.  Granulation tissue forming but not  healed.  Patient reports it has been there for over a month.  No signs of infection MSK: Patient with diffuse pain throughout her body especially in her low back, her neck.  She is able to ambulate with the assistance of a walker but has difficulty going from sitting to standing  ASSESSMENT/PLAN:   Essential hypertension Patient with mildly elevated blood pressure initially.  Repeat within normal limits at goal.  We will continue current medication regimen and continue monitoring.  Consider ambulatory blood pressure monitoring if this persists.  She may need adjustments in her antihypertensive medications.  Type 2 diabetes mellitus with stage 3 chronic kidney disease, with long-term current use of insulin (HCC) Hemoglobin A1c today improved at 6.7 and 6.8 at previous check.  She is continue to use mealtime insulin intermittently.  We will discontinue mealtime insulin and recheck in 3 months.  Patient will continue to check her blood sugars and restart mealtime insulin if needed.  Follow-up in 3 months.  Decubitus ulcer Patient with stage II ulcer inferior and lateral to right labia.  Has been present for several months.  Recommended bacitracin and hydrocolloid dressing.  Did not have any in the clinic to provide o provided the patient with a picture so [facility]  Follow-up in 2 weeks to monitor improvement.  Strict ED and return precautions given.  Impaired ambulation Patient  with continued issues with ambulation after surgery.  Already has Rollator walker which she is dependent upon but it is broken and she is in need of replacement.  Right rear wheel is broken as well as break broken off of the hand on the left side.  She continues to work with physical therapy but will need continued access to Rollator.  DME order placed.     [name], MD [place]

## 2021-01-09 NOTE — Patient Instructions (Signed)
It was wonderful seeing you today!  Congratulations on your A1c it is 6.6 today.  I want to try you stopping taking insulin completely and we will follow-up on your diabetes in 3 months.  If your blood sugars are over 200 you can start taking the insulin again as needed.  Regarding the wound I want you to use the hydrocolloid bandages.  Below is an image of these [facility].  We have scheduled you for an appointment and 2 weeks.  Regarding her blood pressure I want to try ambulatory blood pressure monitoring on you and we will send a message to Dr. Valentina Lucks to set this up.

## 2021-01-10 ENCOUNTER — Telehealth: Payer: Self-pay

## 2021-01-10 DIAGNOSIS — Z23 Encounter for immunization: Secondary | ICD-10-CM | POA: Diagnosis not present

## 2021-01-10 DIAGNOSIS — R262 Difficulty in walking, not elsewhere classified: Secondary | ICD-10-CM | POA: Insufficient documentation

## 2021-01-10 NOTE — Assessment & Plan Note (Signed)
Patient with mildly elevated blood pressure initially.  Repeat within normal limits at goal.  We will continue current medication regimen and continue monitoring.  Consider ambulatory blood pressure monitoring if this persists.  She may need adjustments in her antihypertensive medications.

## 2021-01-10 NOTE — Telephone Encounter (Signed)
Received message from PCP that office visit had been completed for patient to receive rolling walker.   Faxed order, clinical notes and demographics to [facility] at to [phone].  Talbot Grumbling, RN

## 2021-01-10 NOTE — Assessment & Plan Note (Signed)
Hemoglobin A1c today improved at 6.7 and 6.8 at previous check.  She is continue to use mealtime insulin intermittently.  We will discontinue mealtime insulin and recheck in 3 months.  Patient will continue to check her blood sugars and restart mealtime insulin if needed.  Follow-up in 3 months.

## 2021-01-10 NOTE — Assessment & Plan Note (Signed)
Patient with continued issues with ambulation after surgery.  Already has Rollator walker which she is dependent upon but it is broken and she is in need of replacement.  Right rear wheel is broken as well as break broken off of the hand on the left side.  She continues to work with physical therapy but will need continued access to Rollator.  DME order placed.

## 2021-01-10 NOTE — Assessment & Plan Note (Signed)
Patient with stage II ulcer inferior and lateral to right labia.  Has been present for several months.  Recommended bacitracin and hydrocolloid dressing.  Did not have any in the clinic to provide o provided the patient with a picture so [facility]  Follow-up in 2 weeks to monitor improvement.  Strict ED and return precautions given.

## 2021-01-11 ENCOUNTER — Ambulatory Visit (HOSPITAL_BASED_OUTPATIENT_CLINIC_OR_DEPARTMENT_OTHER): Payer: 59 | Admitting: Cardiology

## 2021-01-15 ENCOUNTER — Encounter: Payer: Self-pay | Admitting: Family Medicine

## 2021-01-15 MED ORDER — ATORVASTATIN CALCIUM 40 MG PO TABS: 40.0000 mg | ORAL_TABLET | Freq: Every day | ORAL | 3 refills | Status: DC

## 2021-01-16 ENCOUNTER — Encounter: Payer: Self-pay | Admitting: Family Medicine

## 2021-01-16 ENCOUNTER — Other Ambulatory Visit: Payer: Self-pay | Admitting: Family Medicine

## 2021-01-19 ENCOUNTER — Ambulatory Visit (INDEPENDENT_AMBULATORY_CARE_PROVIDER_SITE_OTHER): Payer: 59 | Admitting: Specialist

## 2021-01-19 ENCOUNTER — Other Ambulatory Visit: Payer: Self-pay

## 2021-01-19 ENCOUNTER — Encounter: Payer: Self-pay | Admitting: Specialist

## 2021-01-19 VITALS — BP 151/88 | HR 98 | Ht 69.0 in | Wt 299.0 lb

## 2021-01-19 DIAGNOSIS — M1611 Unilateral primary osteoarthritis, right hip: Secondary | ICD-10-CM

## 2021-01-19 DIAGNOSIS — R29898 Other symptoms and signs involving the musculoskeletal system: Secondary | ICD-10-CM

## 2021-01-19 DIAGNOSIS — Z6841 Body Mass Index (BMI) 40.0 and over, adult: Secondary | ICD-10-CM

## 2021-01-19 DIAGNOSIS — Z981 Arthrodesis status: Secondary | ICD-10-CM

## 2021-01-19 DIAGNOSIS — M25551 Pain in right hip: Secondary | ICD-10-CM

## 2021-01-19 NOTE — Patient Instructions (Addendum)
Avoid overhead lifting and overhead use of the arms. Do not lift greater than 5 lbs. Adjust head rest in vehicle to prevent hyperextension if rear ended. Take extra precautions to avoid falling. Gabapentin for left neck and facial pain. Referral to Dr. Ernestina Patches for injection of the right hip, intraarticular with steroid and marcain. Referral to Dr. Erlinda Hong for eventual total hip replacement.

## 2021-01-19 NOTE — Progress Notes (Signed)
Office Visit Note   Patient: [name]           Date of Birth: [birth year]           MRN: [id number] Visit Date: 01/19/2021              Requested by: [name], MD [address],  [place] [address] PCP: [name], MD   Assessment & Plan: Visit Diagnoses:  1. Right leg weakness   2. Pain in right hip   3. Morbid obesity ([place])   4. History of fusion of lumbar spine   5. Body mass index 40.0-44.9, adult (HCC)     Plan: Avoid overhead lifting and overhead use of the arms. Do not lift greater than 5 lbs. Adjust head rest in vehicle to prevent hyperextension if rear ended. Take extra precautions to avoid falling. Gabapentin for left neck and facial pain. Referral to Dr. Ernestina Patches for injection of the right hip, intraarticular with steroid and marcain. Referral to Dr. Erlinda Hong for eventual total hip replacement.  Follow-Up Instructions: No follow-ups on file.   Orders:  No orders of the defined types were placed in this encounter.  No orders of the defined types were placed in this encounter.     Procedures: No procedures performed   Clinical Data: No additional findings.   Subjective: Chief Complaint  Patient presents with   Lower Back - Follow-up   Right Hip - Pain    55 year old female with history of thoracolumbar laminectomies for spinal stenosis and previous long lumbar fusion. She is painful all the time and has pain into her knee right greater than left. The left knee doesn't bother her but the right knee is painfult to stand and walk on. There is knee pain at night. Injection of the knee with steriod, she is not sure it helped due to post op feelings of severe nerve. Body mass index is 44.15 kg/m. She has increasing right hip pain. In the past Dr. Junius Roads has performed U/S guided right hip IA injection    Review of Systems  Constitutional: Negative.   HENT: Negative.    Eyes: Negative.   Respiratory: Negative.    Cardiovascular:  Negative.   Gastrointestinal: Negative.   Endocrine: Negative.   Genitourinary: Negative.   Musculoskeletal: Negative.   Skin: Negative.   Allergic/Immunologic: Negative.   Neurological: Negative.   Hematological: Negative.   Psychiatric/Behavioral: Negative.      Objective: Vital Signs: BP (!) 151/88 (BP Location: Left Arm, Patient Position: Sitting)   Pulse 98   Ht 5\' 9"  (1.753 m)   Wt 299 lb (135.6 kg)   LMP 04/10/2006   BMI 44.15 kg/m   Physical Exam Constitutional:      Appearance: She is well-developed.  HENT:     Head: Normocephalic and atraumatic.  Eyes:     Pupils: Pupils are equal, round, and reactive to light.  Pulmonary:     Effort: Pulmonary effort is normal.     Breath sounds: Normal breath sounds.  Abdominal:     General: Bowel sounds are normal.     Palpations: Abdomen is soft.  Musculoskeletal:     Cervical back: Normal range of motion and neck supple.     Lumbar back: Negative right straight leg raise test and negative left straight leg raise test.  Skin:    General: Skin is warm and dry.  Neurological:     Mental Status: She is alert and oriented  to person, place, and time.  Psychiatric:        Behavior: Behavior normal.        Thought Content: Thought content normal.        Judgment: Judgment normal.    Back Exam   Tenderness  The patient is experiencing tenderness in the lumbar.  Range of Motion  Extension:  abnormal  Flexion:  abnormal  Lateral bend left:  abnormal  Rotation right:  abnormal  Rotation left:  abnormal   Muscle Strength  Right Quadriceps:  5/5  Left Quadriceps:  5/5  Right Hamstrings:  5/5  Left Hamstrings:  5/5   Tests  Straight leg raise right: negative Straight leg raise left: negative  Reflexes  Patellar:  0/4 Achilles:  0/4     Specialty Comments:  No specialty comments available.  Imaging: No results found.   PMFS History: Patient Active Problem List   Diagnosis Date Noted   Impaired  ambulation 01/10/2021   Status post lumbar laminectomy 10/03/2020   Cubital tunnel syndrome on left 05/02/2020   Vaginal discharge 01/20/2020   Prolapse of anterior vaginal wall 11/10/2019   Skin ulcer of left thigh, limited to breakdown of skin ([place]) 09/09/2019   Ulnar nerve compression 08/06/2019   Diabetic neuropathy associated with type 2 diabetes mellitus ([place]) 08/06/2019   Body mass index 50.0-59.9, adult ([place]) 06/12/2019   Impingement syndrome of left shoulder 05/07/2019   Polyp of colon    Polyp of ascending colon    Rectal discomfort 02/12/2019   Osteoarthritis of right hip 02/12/2019   Nontraumatic tear of left supraspinatus tendon 12/30/2018   Gout 12/17/2018   Nontraumatic complete tear of right rotator cuff 12/08/2018   Rotator cuff tear, right 12/01/2018   Tobacco abuse 08/21/2018   Right groin pain 07/20/2018   Depressed mood 07/13/2018   Paroxysmal SVT (supraventricular tachycardia) ([place]) 03/20/2018   Pure hypercholesterolemia 03/20/2018   Muscle cramping 02/25/2018   Nodule of upper lobe of right lung 02/13/2018   Falls, subsequent encounter 01/30/2018   Pain in left foot 11/04/2017   Essential hypertension 10/27/2017   Solitary pulmonary nodule 10/07/2017   Restrictive lung disease secondary to obesity 10/01/2017   Polycythemia, secondary 10/01/2017   Chronic viral hepatitis B without delta-agent ([place]) 07/08/2017   COPD (chronic obstructive pulmonary disease) with chronic bronchitis ([place]) 06/27/2017   Chronic kidney disease (CKD), stage IV (severe) ([place]) 01/14/2017   Decubitus ulcer 12/13/2016   Type 2 diabetes mellitus with stage 3 chronic kidney disease, with long-term current use of insulin ([place]) 12/12/2016   Urge incontinence of urine 12/05/2016   Trigger finger, left index finger 07/15/2016   Back pain 04/07/2014   Morbid obesity ([place]) 10/02/2012   Chronic diastolic CHF (congestive heart failure) ([place]) 04/07/2012   GERD 01/26/2010   Obstructive sleep  apnea treated with BiPAP 02/03/2008   FIBROCYSTIC BREAST DISEASE 10/28/2006   Hyperlipidemia 03/27/2006   Obesity hypoventilation syndrome ([place]) 03/27/2006   Depression with anxiety 03/27/2006   Past Medical History:  Diagnosis Date   Alcoholism ([place])    Anxiety    Arthritis 04-10-11   hips, shoulders, back   Asthma    Bipolar disorder ([place])    Cardiomegaly    Carpal tunnel syndrome, bilateral    Cervical cancer ([place]) 1993   cervical, no treatment done, went away per pt   Cervical dysplasia or atypia 04-10-11   '93- once dx.-got pregnant-no intervention, then postpartum, no dysplasia found   CHF (congestive heart failure) ([place])  no cardiologist 2014 dx, none now   Chronic hypoxemic respiratory failure ([place])    Chronic kidney disease    stage 3 kidney disease   Condyloma - gluteal cleft 04/09/2011   Removed by general surgery. Pathology showed Condyloma, gluteal CONDYLOMA ACUMINATUM.    COPD (chronic obstructive pulmonary disease) ([place])    Depression    Diabetes mellitus without complication ([place])    Dyspnea    with actity, sitting   Fall 12/16/2016   Fibrocystic breast disease    GERD (gastroesophageal reflux disease)    Grade I diastolic dysfunction 96/29/5284   Noted on ECHO   Hepatitis    hep B-count is low at present,doesn't register   History of PSVT (paroxysmal supraventricular tachycardia)    Hypercalcemia    Hyperlipidemia    Hypertension    Incomplete left bundle branch block (LBBB) 09/24/2018   Noted on EKG   Lung nodule 11/2017   right and left lung   Migraine headache    none recent   Morbid obesity ([place]) 03/27/2006   Neuropathy    Pneumonia    walking pneu 15 yrs. ago   Skin lesion 03/15/2011   In gluteal crease now s/p removal by Dr. [name] of General Surgery on 3/12. Path shows condyloma.      Sleep apnea 04-10-11   uses cpap, pt does not know settings   Trigger finger    left third   Urge incontinence of urine     Family History  Problem  Relation Age of Onset   Pulmonary embolism Mother    Stroke Father    Alcohol abuse Father    Pneumonia Father    Hypertension Father    Breast cancer Maternal Aunt    Hypertension Other    Diabetes Other        Aunts and cousins   Asthma Sister    Depression Sister    Arthritis Sister    Sickle cell anemia Son     Past Surgical History:  Procedure Laterality Date   ANAL FISTULECTOMY  04/17/2011   Procedure: FISTULECTOMY ANAL;  Surgeon: Imogene Burn. [name], MD;  Location: [facility];  Service: General;  Laterality: N/A;  Excision of Condyloma Gluteal Cleft    ANAL RECTAL MANOMETRY N/A 03/26/2019   Procedure: ANO RECTAL MANOMETRY;  Surgeon: Thornton Park, MD;  [facility];  Service: Gastroenterology;  Laterality: N/A;   BACK SURGERY  2009   pt thinks it was an "interbody fusion"   BIOPSY  03/09/2019   Procedure: BIOPSY;  Surgeon: Thornton Park, MD;  Location: [facility];  Service: Gastroenterology;;   BOTOX INJECTION N/A 12/29/2018   Procedure: BOTOX INJECTION(100 UNITS)  WITH CYSTOSCOPY;  Surgeon: Ceasar Mons, MD;  [facility];  Service: Urology;  Laterality: N/A;   BOTOX INJECTION N/A 07/27/2019   Procedure: BOTOX INJECTION WITH CYSTOSCOPY;  Surgeon: Ceasar Mons, MD;  [facility];  Service: Urology;  Laterality: N/A;  ONLY NEEDS 30 MIN   BREAST CYST EXCISION     bilateral breast, 3 cysts removed from each breast   BREAST EXCISIONAL BIOPSY Right    x 3   BREAST EXCISIONAL BIOPSY Left    x 3   CARPAL TUNNEL RELEASE Bilateral    Cervical biospy     CERVICAL FUSION  04/10/2011   x3- cervical fusion with plating and screws-Dr. [name]   COLONOSCOPY WITH PROPOFOL N/A 07/06/2015   Procedure: COLONOSCOPY WITH PROPOFOL;  Surgeon: [name], MD;  Location: [facility];  Service: Endoscopy;  Laterality: N/A;   COLONOSCOPY WITH PROPOFOL N/A 03/09/2019   Procedure: COLONOSCOPY WITH PROPOFOL;  Surgeon: Thornton Park, MD;  [facility];  Service: Gastroenterology;  Laterality: N/A;   DOPPLER ECHOCARDIOGRAPHY  04/06/2012   AT [place] 55-60%   FOOT SURGERY Left 2018   FRACTURE SURGERY     little toe left foot   I & D EXTREMITY Left 12/18/2016   Procedure: IRRIGATION AND DEBRIDEMENT LEFT FOOT, CLOSURE;  Surgeon: [name], MD;  Location: [facility];  Service: Orthopedics;  Laterality: Left;   INJECTION KNEE Right 10/03/2020   Procedure: KNEE INJECTION;  Surgeon: Jessy Oto, MD;  Location: [facility];  Service: Orthopedics;  Laterality: Right;   LAPAROSCOPIC GASTRIC SLEEVE RESECTION N/A 05/08/2020   Procedure: LAPAROSCOPIC GASTRIC SLEEVE RESECTION;  Surgeon: Kieth Brightly Arta Bruce, MD;  [facility];  Service: General;  Laterality: N/A;   [place] SURGERY  04/10/2011   x5-Lumbar fusion-retained hardware.(Dr. Louanne Skye)   LUMBAR LAMINECTOMY N/A 10/03/2020   Procedure: THORACOLUMBAR LAMINECTOMIES THORACIC ELEVEN-TWELVE, THORACIC TWELVE-LUMBAR ONE  AND LUMBAT ONE-TWO;  Surgeon: Jessy Oto, MD;  Location: [facility];  Service: Orthopedics;  Laterality: N/A;   MULTIPLE TOOTH EXTRACTIONS     POLYPECTOMY  03/09/2019   Procedure: POLYPECTOMY;  Surgeon: Thornton Park, MD;  [facility];  Service: Gastroenterology;;   SHOULDER ARTHROSCOPY WITH ROTATOR CUFF REPAIR AND SUBACROMIAL DECOMPRESSION Left 05/07/2019   Procedure: LEFT SHOULDER ARTHROSCOPY WITH DEBRIDEMENT, DISTAL CLAVICLE EXCISION,  SUBACROMIAL DECOMPRESSION AND POSSIBLE ROTATOR CUFF REPAIR;  Surgeon: [name], MD;  Location: [facility];  Service: Orthopedics;  Laterality: Left;   TEE WITHOUT CARDIOVERSION N/A 04/06/2012   Procedure: TRANSESOPHAGEAL ECHOCARDIOGRAM (TEE);  Surgeon: [name], MD;  Location: [facility];  Service: Cardiovascular;  Laterality: N/A;   TRIGGER FINGER RELEASE Right    middle finger   TRIGGER FINGER RELEASE Left 06/28/2016   Procedure: RELEASE TRIGGER FINGER LEFT 3RD FINGER;  Surgeon: [name], MD;   Location: [facility];  Service: Orthopedics;  Laterality: Left;   TRIGGER FINGER RELEASE Right 06/12/2016   Procedure: RIGHT INDEX FINGER TRIGGER RELEASE;  Surgeon: [name], MD;  Location: [facility];  Service: Orthopedics;  Laterality: Right;   TRIGGER FINGER RELEASE Right 01/19/2018   Procedure: RIGHT RING FINGER TRIGGER FINGER RELEASE;  Surgeon: [name], MD;  Location: [facility];  Service: Orthopedics;  Laterality: Right;   TRIGGER FINGER RELEASE Left 05/07/2019   Procedure: RELEASE TRIGGER FINGER LEFT INDEX FINGER;  Surgeon: [name], MD;  Location: [facility];  Service: Orthopedics;  Laterality: Left;   UPPER GI ENDOSCOPY N/A 05/08/2020   Procedure: UPPER GI ENDOSCOPY;  Surgeon: Kieth Brightly, Arta Bruce, MD;  [facility];  Service: General;  Laterality: N/A;   Social History   Occupational History   Occupation: disabled    Comment: Disabled  Tobacco Use   Smoking status: Some Days    Packs/day: 0.20    Years: 40.00    Pack years: 8.00    Types: Cigarettes    Start date: 01/28/1977   Smokeless tobacco: Never   Tobacco comments:    Smoked 2 ciggs on 07/17/2020 has not smoked since  Vaping Use   Vaping Use: Never used  Substance and Sexual Activity   Alcohol use: No    Alcohol/week: 0.0 standard drinks   Drug use: Not Currently    Types: Marijuana, "Crack" cocaine    Comment: hx marijuana [facility]  years ago, Crack -none since 11/2015   Sexual activity: Not Currently    Partners: Male    Birth control/protection: Post-menopausal

## 2021-01-23 ENCOUNTER — Ambulatory Visit: Payer: 59 | Admitting: Family Medicine

## 2021-01-27 ENCOUNTER — Other Ambulatory Visit: Payer: Self-pay | Admitting: Family Medicine

## 2021-01-27 DIAGNOSIS — K146 Glossodynia: Secondary | ICD-10-CM

## 2021-01-30 ENCOUNTER — Ambulatory Visit
Admission: RE | Admit: 2021-01-30 | Discharge: 2021-01-30 | Disposition: A | Discharge status: Home / Self Care | Payer: 59 | Source: Ambulatory Visit | Attending: Neurology | Admitting: Neurology

## 2021-01-30 DIAGNOSIS — Z1231 Encounter for screening mammogram for malignant neoplasm of breast: Secondary | ICD-10-CM

## 2021-02-05 ENCOUNTER — Other Ambulatory Visit: Payer: Self-pay | Admitting: Specialist

## 2021-02-06 ENCOUNTER — Ambulatory Visit: Payer: Self-pay

## 2021-02-06 ENCOUNTER — Other Ambulatory Visit: Payer: Self-pay

## 2021-02-06 ENCOUNTER — Encounter: Payer: Self-pay | Admitting: Physical Medicine and Rehabilitation

## 2021-02-06 ENCOUNTER — Telehealth: Payer: Self-pay | Admitting: Pharmacist

## 2021-02-06 ENCOUNTER — Encounter: Payer: Self-pay | Admitting: Orthopaedic Surgery

## 2021-02-06 ENCOUNTER — Ambulatory Visit (INDEPENDENT_AMBULATORY_CARE_PROVIDER_SITE_OTHER): Payer: 59 | Admitting: Orthopaedic Surgery

## 2021-02-06 ENCOUNTER — Ambulatory Visit: Payer: 59 | Admitting: Physical Medicine and Rehabilitation

## 2021-02-06 VITALS — Ht 67.5 in | Wt 297.0 lb

## 2021-02-06 DIAGNOSIS — M75111 Incomplete rotator cuff tear or rupture of right shoulder, not specified as traumatic: Secondary | ICD-10-CM | POA: Diagnosis not present

## 2021-02-06 DIAGNOSIS — M1611 Unilateral primary osteoarthritis, right hip: Secondary | ICD-10-CM | POA: Diagnosis not present

## 2021-02-06 DIAGNOSIS — Z6841 Body Mass Index (BMI) 40.0 and over, adult: Secondary | ICD-10-CM

## 2021-02-06 DIAGNOSIS — Z720 Tobacco use: Secondary | ICD-10-CM

## 2021-02-06 DIAGNOSIS — M25551 Pain in right hip: Secondary | ICD-10-CM

## 2021-02-06 MED ORDER — BUPIVACAINE HCL 0.25 % IJ SOLN
4.0000 mL | INTRAMUSCULAR | Status: AC | PRN
  Administered 2021-02-06: 4 mL via INTRA_ARTICULAR

## 2021-02-06 MED ORDER — TRIAMCINOLONE ACETONIDE 40 MG/ML IJ SUSP
60.0000 mg | INTRAMUSCULAR | Status: AC | PRN
  Administered 2021-02-06: 60 mg via INTRA_ARTICULAR

## 2021-02-06 MED ORDER — VARENICLINE TARTRATE 1 MG PO TABS: 1.0000 mg | ORAL_TABLET | Freq: Two times a day (BID) | ORAL | 3 refills | Status: DC

## 2021-02-06 NOTE — Progress Notes (Signed)
Pt state right hip pain. Pt state walking, standing and laying down makes the pain worse. Pt state she takes pain meds to help ease his pain.  Numeric Pain Rating Scale and Functional Assessment Average Pain 9   In the last MONTH (on 0-10 scale) has pain interfered with the following?  1. General activity like being  able to carry out your everyday physical activities such as walking, climbing stairs, carrying groceries, or moving a chair?  Rating(10)    -BT, -Dye Allergies.

## 2021-02-06 NOTE — Telephone Encounter (Signed)
-----   Message from Leavy Cella, [place] sent at 12/19/2020 11:27 AM EST ----- Regarding: Tobacco Cessation Tobacco Intake Reduction?

## 2021-02-06 NOTE — Progress Notes (Signed)
° °  [name] - 56 y.o. female MRN [id number]  Date of birth: November 19, 1965  Office Visit Note: Visit Date: 02/06/2021 PCP: [name], MD Referred by: [name], MD  Subjective: Chief Complaint  Patient presents with   Right Hip - Pain   HPI:  [name] is a 56 y.o. female who comes in today at the request of Dr. [name] for planned Right anesthetic hip arthrogram with fluoroscopic guidance.  The patient has failed conservative care including home exercise, medications, time and activity modification.  This injection will be diagnostic and hopefully therapeutic.  Please see requesting physician notes for further details and justification.   ROS Otherwise per HPI.  Assessment & Plan: Visit Diagnoses:    ICD-10-CM   1. Pain in right hip  M25.551 Large Joint Inj: R hip joint    XR C-ARM NO REPORT      Plan: No additional findings.   Meds & Orders: No orders of the defined types were placed in this encounter.   Orders Placed This Encounter  Procedures   Large Joint Inj: R hip joint   XR C-ARM NO REPORT    Follow-up: Return for Eduard Roux, MD today.   Procedures: Large Joint Inj: R hip joint on 02/06/2021 8:49 AM Indications: diagnostic evaluation and pain Details: 22 G 3.5 in needle, fluoroscopy-guided anterior approach  Arthrogram: No  Medications: 4 mL bupivacaine 0.25 %; 60 mg triamcinolone acetonide 40 MG/ML Outcome: tolerated well, no immediate complications  There was excellent flow of contrast producing a partial arthrogram of the hip. The patient did have relief of symptoms during the anesthetic phase of the injection. Procedure, treatment alternatives, risks and benefits explained, specific risks discussed. Consent was given by the patient. Immediately prior to procedure a time out was called to verify the correct patient, procedure, equipment, support staff and site/side marked as required. Patient was prepped and draped in the usual sterile  fashion.         Clinical History: EXAM: DG HIP (WITH OR WITHOUT PELVIS) 3V RIGHT   COMPARISON:  None.   FINDINGS: Pelvic ring is intact. Severe degenerative changes of the right hip joint are noted with subchondral sclerosis and cyst formation. Loss of the superior joint space is noted as well. Mild degenerative changes of left hip joint are seen. Postsurgical changes in the lower lumbar spine are noted. No soft tissue abnormality is seen.   IMPRESSION: Severe degenerative changes of the right hip joint.     Electronically Signed   By: Inez Catalina M.D.   On: 05/09/2020 14:52     Objective:  VS:  HT:     WT:    BMI:      BP:    HR: bpm   TEMP: ( )   RESP:  Physical Exam   Imaging: No results found.

## 2021-02-06 NOTE — Addendum Note (Signed)
Addended by: Precious Bard on: 02/06/2021 03:56 PM   Modules accepted: Orders

## 2021-02-06 NOTE — Telephone Encounter (Signed)
Noted and agree. 

## 2021-02-06 NOTE — Progress Notes (Signed)
Office Visit Note   Patient: [name]           Date of Birth: 06-02-1965           MRN: [id number] Visit Date: 02/06/2021              Requested by: [name], MD [address],  [place] [address] PCP: [name], MD   Assessment & Plan: Visit Diagnoses:  1. Primary osteoarthritis of right hip   2. Nontraumatic incomplete tear of right rotator cuff   3. Body mass index 45.0-49.9, adult ([place])   4. Morbid obesity ([place])     Plan: Ms. Kerry Hough is a 56 year old female who is well-known to me who comes in for evaluation of chronic right hip pain from end-stage DJD.  She is also here to be evaluated for chronic right shoulder pain.  In regards to her right hip she has bone-on-bone right hip DJD.  She had a right hip injection this morning by Dr. Ernestina Patches and she does feel good anesthetic relief currently.  She walks with a walker.  She underwent gastric sleeve surgery in April 2022 and so far she has lost 120 pounds.  In regards to the right shoulder she did have an MRI in 2020 which showed a small full-thickness supraspinatus tear.  We elected to treat this conservatively at the time.  She feels that this pain has gotten worse as well.  Denies any new injuries or trauma.  Right hip exam shows limited logroll and internal and external rotation secondary to pain and guarding.  Positive Stinchfield sign.  She does have a large abdominal pannus overhanging the anterior right thigh. Right shoulder exam shows pain with empty can sign.  Range of motion well-preserved.  Strength is grossly preserved.  Regards to the right hip we will need to continue conservative management until her BMI is less than 40.  The other issue that I see is the large overhanging pannus that I feel poses a significant risk for surgical site infection.  Once she has achieved her goal BMI she will need to see a plastic surgeon for panniculectomy before we can move forward with hip replacement surgery.  In  regards to the right shoulder we will need to obtain a new MRI since it has been over 2 years since previous 1 her symptoms have gotten worse.  Previous MRI showed small full-thickness supraspinatus tear.  Follow-up after the MRI.  The patient meets the AMA guidelines for Morbid (severe) obesity with a BMI > 40.0 and I have recommended weight loss.  Follow-Up Instructions: No follow-ups on file.   Orders:  Orders Placed This Encounter  Procedures   XR HIP UNILAT W OR W/O PELVIS 2-3 VIEWS RIGHT   No orders of the defined types were placed in this encounter.     Procedures: No procedures performed   Clinical Data: No additional findings.   Subjective: Chief Complaint  Patient presents with   Right Hip - Pain    HPI  Review of Systems  Constitutional: Negative.   HENT: Negative.    Eyes: Negative.   Respiratory: Negative.    Cardiovascular: Negative.   Endocrine: Negative.   Musculoskeletal: Negative.   Neurological: Negative.   Hematological: Negative.   Psychiatric/Behavioral: Negative.    All other systems reviewed and are negative.   Objective: Vital Signs: Ht 5' 7.5" (1.715 m)    Wt 297 lb (134.7 kg)    LMP 04/10/2006  BMI 45.83 kg/m   Physical Exam Vitals and nursing note reviewed.  Constitutional:      Appearance: She is well-developed.  Pulmonary:     Effort: Pulmonary effort is normal.  Skin:    General: Skin is warm.     Capillary Refill: Capillary refill takes less than 2 seconds.  Neurological:     Mental Status: She is alert and oriented to person, place, and time.  Psychiatric:        Behavior: Behavior normal.        Thought Content: Thought content normal.        Judgment: Judgment normal.    Ortho Exam  Specialty Comments:  No specialty comments available.  Imaging: XR HIP UNILAT W OR W/O PELVIS 2-3 VIEWS RIGHT  Result Date: 02/06/2021 Bone-on-bone joint space narrowing consistent with severe degenerative joint disease.     PMFS History: Patient Active Problem List   Diagnosis Date Noted   Impaired ambulation 01/10/2021   Status post lumbar laminectomy 10/03/2020   Cubital tunnel syndrome on left 05/02/2020   Vaginal discharge 01/20/2020   Prolapse of anterior vaginal wall 11/10/2019   Skin ulcer of left thigh, limited to breakdown of skin ([place]) 09/09/2019   Ulnar nerve compression 08/06/2019   Diabetic neuropathy associated with type 2 diabetes mellitus ([place]) 08/06/2019   Body mass index 50.0-59.9, adult ([place]) 06/12/2019   Impingement syndrome of left shoulder 05/07/2019   Polyp of colon    Polyp of ascending colon    Rectal discomfort 02/12/2019   Osteoarthritis of right hip 02/12/2019   Nontraumatic tear of left supraspinatus tendon 12/30/2018   Gout 12/17/2018   Nontraumatic complete tear of right rotator cuff 12/08/2018   Rotator cuff tear, right 12/01/2018   Tobacco abuse 08/21/2018   Right groin pain 07/20/2018   Depressed mood 07/13/2018   Paroxysmal SVT (supraventricular tachycardia) ([place]) 03/20/2018   Pure hypercholesterolemia 03/20/2018   Muscle cramping 02/25/2018   Nodule of upper lobe of right lung 02/13/2018   Falls, subsequent encounter 01/30/2018   Pain in left foot 11/04/2017   Essential hypertension 10/27/2017   Solitary pulmonary nodule 10/07/2017   Restrictive lung disease secondary to obesity 10/01/2017   Polycythemia, secondary 10/01/2017   Chronic viral hepatitis B without delta-agent ([place]) 07/08/2017   COPD (chronic obstructive pulmonary disease) with chronic bronchitis ([place]) 06/27/2017   Chronic kidney disease (CKD), stage IV (severe) ([place]) 01/14/2017   Decubitus ulcer 12/13/2016   Type 2 diabetes mellitus with stage 3 chronic kidney disease, with long-term current use of insulin ([place]) 12/12/2016   Urge incontinence of urine 12/05/2016   Trigger finger, left index finger 07/15/2016   Back pain 04/07/2014   Morbid obesity ([place]) 10/02/2012   Chronic diastolic CHF  (congestive heart failure) ([place]) 04/07/2012   GERD 01/26/2010   Obstructive sleep apnea treated with BiPAP 02/03/2008   FIBROCYSTIC BREAST DISEASE 10/28/2006   Hyperlipidemia 03/27/2006   Obesity hypoventilation syndrome ([place]) 03/27/2006   Depression with anxiety 03/27/2006   Past Medical History:  Diagnosis Date   Alcoholism ([place])    Anxiety    Arthritis 04-10-11   hips, shoulders, back   Asthma    Bipolar disorder ([place])    Cardiomegaly    Carpal tunnel syndrome, bilateral    Cervical cancer ([place]) 1993   cervical, no treatment done, went away per pt   Cervical dysplasia or atypia 04-10-11   '93- once dx.-got pregnant-no intervention, then postpartum, no dysplasia found   CHF (congestive heart failure) (  [place])    no cardiologist 2014 dx, none now   Chronic hypoxemic respiratory failure ([place])    Chronic kidney disease    stage 3 kidney disease   Condyloma - gluteal cleft 04/09/2011   Removed by general surgery. Pathology showed Condyloma, gluteal CONDYLOMA ACUMINATUM.    COPD (chronic obstructive pulmonary disease) ([place])    Depression    Diabetes mellitus without complication ([place])    Dyspnea    with actity, sitting   Fall 12/16/2016   Fibrocystic breast disease    GERD (gastroesophageal reflux disease)    Grade I diastolic dysfunction 37/85/8850   Noted on ECHO   Hepatitis    hep B-count is low at present,doesn't register   History of PSVT (paroxysmal supraventricular tachycardia)    Hypercalcemia    Hyperlipidemia    Hypertension    Incomplete left bundle branch block (LBBB) 09/24/2018   Noted on EKG   Lung nodule 11/2017   right and left lung   Migraine headache    none recent   Morbid obesity ([place]) 03/27/2006   Neuropathy    Pneumonia    walking pneu 15 yrs. ago   Skin lesion 03/15/2011   In gluteal crease now s/p removal by Dr. [name] of General Surgery on 3/12. Path shows condyloma.      Sleep apnea 04-10-11   uses cpap, pt does not know settings   Trigger  finger    left third   Urge incontinence of urine     Family History  Problem Relation Age of Onset   Pulmonary embolism Mother    Stroke Father    Alcohol abuse Father    Pneumonia Father    Hypertension Father    Breast cancer Maternal Aunt    Hypertension Other    Diabetes Other        Aunts and cousins   Asthma Sister    Depression Sister    Arthritis Sister    Sickle cell anemia Son     Past Surgical History:  Procedure Laterality Date   ANAL FISTULECTOMY  04/17/2011   Procedure: FISTULECTOMY ANAL;  Surgeon: Imogene Burn. [name], MD;  Location: [facility];  Service: General;  Laterality: N/A;  Excision of Condyloma Gluteal Cleft    ANAL RECTAL MANOMETRY N/A 03/26/2019   Procedure: ANO RECTAL MANOMETRY;  Surgeon: Thornton Park, MD;  [facility];  Service: Gastroenterology;  Laterality: N/A;   BACK SURGERY  2009   pt thinks it was an "interbody fusion"   BIOPSY  03/09/2019   Procedure: BIOPSY;  Surgeon: Thornton Park, MD;  Location: [facility];  Service: Gastroenterology;;   BOTOX INJECTION N/A 12/29/2018   Procedure: BOTOX INJECTION(100 UNITS)  WITH CYSTOSCOPY;  Surgeon: Ceasar Mons, MD;  [facility];  Service: Urology;  Laterality: N/A;   BOTOX INJECTION N/A 07/27/2019   Procedure: BOTOX INJECTION WITH CYSTOSCOPY;  Surgeon: Ceasar Mons, MD;  [facility];  Service: Urology;  Laterality: N/A;  ONLY NEEDS 30 MIN   BREAST CYST EXCISION     bilateral breast, 3 cysts removed from each breast   BREAST EXCISIONAL BIOPSY Right    x 3   BREAST EXCISIONAL BIOPSY Left    x 3   CARPAL TUNNEL RELEASE Bilateral    Cervical biospy     CERVICAL FUSION  04/10/2011   x3- cervical fusion with plating and screws-Dr. [name]   COLONOSCOPY WITH PROPOFOL N/A 07/06/2015   Procedure: COLONOSCOPY WITH PROPOFOL;  Surgeon: [name],  MD;  [facility];  Service: Endoscopy;  Laterality: N/A;   COLONOSCOPY WITH PROPOFOL N/A 03/09/2019    Procedure: COLONOSCOPY WITH PROPOFOL;  Surgeon: Thornton Park, MD;  [facility];  Service: Gastroenterology;  Laterality: N/A;   DOPPLER ECHOCARDIOGRAPHY  04/06/2012   AT [place] 55-60%   FOOT SURGERY Left 2018   FRACTURE SURGERY     little toe left foot   I & D EXTREMITY Left 12/18/2016   Procedure: IRRIGATION AND DEBRIDEMENT LEFT FOOT, CLOSURE;  Surgeon: [name], MD;  Location: [facility];  Service: Orthopedics;  Laterality: Left;   INJECTION KNEE Right 10/03/2020   Procedure: KNEE INJECTION;  Surgeon: Jessy Oto, MD;  Location: [facility];  Service: Orthopedics;  Laterality: Right;   LAPAROSCOPIC GASTRIC SLEEVE RESECTION N/A 05/08/2020   Procedure: LAPAROSCOPIC GASTRIC SLEEVE RESECTION;  Surgeon: Kieth Brightly Arta Bruce, MD;  [facility];  Service: General;  Laterality: N/A;   [place] SURGERY  04/10/2011   x5-Lumbar fusion-retained hardware.(Dr. Louanne Skye)   LUMBAR LAMINECTOMY N/A 10/03/2020   Procedure: THORACOLUMBAR LAMINECTOMIES THORACIC ELEVEN-TWELVE, THORACIC TWELVE-LUMBAR ONE  AND LUMBAT ONE-TWO;  Surgeon: Jessy Oto, MD;  Location: [facility];  Service: Orthopedics;  Laterality: N/A;   MULTIPLE TOOTH EXTRACTIONS     POLYPECTOMY  03/09/2019   Procedure: POLYPECTOMY;  Surgeon: Thornton Park, MD;  [facility];  Service: Gastroenterology;;   SHOULDER ARTHROSCOPY WITH ROTATOR CUFF REPAIR AND SUBACROMIAL DECOMPRESSION Left 05/07/2019   Procedure: LEFT SHOULDER ARTHROSCOPY WITH DEBRIDEMENT, DISTAL CLAVICLE EXCISION,  SUBACROMIAL DECOMPRESSION AND POSSIBLE ROTATOR CUFF REPAIR;  Surgeon: [name], MD;  Location: [facility];  Service: Orthopedics;  Laterality: Left;   TEE WITHOUT CARDIOVERSION N/A 04/06/2012   Procedure: TRANSESOPHAGEAL ECHOCARDIOGRAM (TEE);  Surgeon: [name], MD;  Location[facility];  Service: Cardiovascular;  Laterality: N/A;   TRIGGER FINGER RELEASE Right    middle finger   TRIGGER FINGER RELEASE Left 06/28/2016    Procedure: RELEASE TRIGGER FINGER LEFT 3RD FINGER;  Surgeon: [name], MD;  Location: [facility];  Service: Orthopedics;  Laterality: Left;   TRIGGER FINGER RELEASE Right 06/12/2016   Procedure: RIGHT INDEX FINGER TRIGGER RELEASE;  Surgeon: [name], MD;  Location: [facility];  Service: Orthopedics;  Laterality: Right;   TRIGGER FINGER RELEASE Right 01/19/2018   Procedure: RIGHT RING FINGER TRIGGER FINGER RELEASE;  Surgeon: [name], MD;  Location: [facility];  Service: Orthopedics;  Laterality: Right;   TRIGGER FINGER RELEASE Left 05/07/2019   Procedure: RELEASE TRIGGER FINGER LEFT INDEX FINGER;  Surgeon: [name], MD;  Location: [facility];  Service: Orthopedics;  Laterality: Left;   UPPER GI ENDOSCOPY N/A 05/08/2020   Procedure: UPPER GI ENDOSCOPY;  Surgeon: Kieth Brightly, Arta Bruce, MD;  [facility];  Service: General;  Laterality: N/A;   Social History   Occupational History   Occupation: disabled    Comment: Disabled  Tobacco Use   Smoking status: Some Days    Packs/day: 0.20    Years: 40.00    Pack years: 8.00    Types: Cigarettes    Start date: 01/28/1977   Smokeless tobacco: Never   Tobacco comments:    Smoked 2 ciggs on 07/17/2020 has not smoked since  Vaping Use   Vaping Use: Never used  Substance and Sexual Activity   Alcohol use: No    Alcohol/week: 0.0 standard drinks   Drug use: Not Currently    Types: Marijuana, "Crack" cocaine  Comment: hx marijuana usemany years ago, Crack -none since 11/2015   Sexual activity: Not Currently    Partners: Male    Birth control/protection: Post-menopausal

## 2021-02-06 NOTE — Telephone Encounter (Signed)
Patient contacted for follow/up of tobacco intake reduction /cessation attempt.  Since last contact patient reports that her back, knee and hip pain are improved slightly.   Medications currently being used; None Requested to restart Varenicline.  Agreed with plan to start 1 mg daily for 1 week then 1mg  BID with food.  New prescription order placed.   Rates IMPORTANCE of quitting tobacco as high. Rates CONFIDENCE of quitting tobacco as good. Most common triggers to use tobacco include; stress/anger  Motivation to quit: breathing and health.  Total time with patient call and documentation of interaction: 13 minutes.  F/U Phone call planned: 3 weeks

## 2021-02-07 ENCOUNTER — Ambulatory Visit (HOSPITAL_BASED_OUTPATIENT_CLINIC_OR_DEPARTMENT_OTHER): Payer: 59 | Attending: Specialist | Admitting: Physical Therapy

## 2021-02-07 DIAGNOSIS — Z9889 Other specified postprocedural states: Secondary | ICD-10-CM | POA: Insufficient documentation

## 2021-02-07 DIAGNOSIS — M25561 Pain in right knee: Secondary | ICD-10-CM

## 2021-02-07 DIAGNOSIS — R29898 Other symptoms and signs involving the musculoskeletal system: Secondary | ICD-10-CM | POA: Diagnosis present

## 2021-02-07 DIAGNOSIS — R2689 Other abnormalities of gait and mobility: Secondary | ICD-10-CM

## 2021-02-07 DIAGNOSIS — M25551 Pain in right hip: Secondary | ICD-10-CM

## 2021-02-07 DIAGNOSIS — G8929 Other chronic pain: Secondary | ICD-10-CM

## 2021-02-07 DIAGNOSIS — M545 Low back pain, unspecified: Secondary | ICD-10-CM

## 2021-02-07 DIAGNOSIS — M6281 Muscle weakness (generalized): Secondary | ICD-10-CM

## 2021-02-07 NOTE — Progress Notes (Signed)
° ° °  SUBJECTIVE:   CHIEF COMPLAINT / HPI:   Wound re-evaluation  Patient reports that since her last visit her wound has not improved at all.  She has been keeping it covered with Tegaderms.  She denies any drainage, warmth around the wound.  She is concerned because it is not healing.  She reports that most days she wears cotton underwear but when she is at home she wears no underwear and usually wears a dress.  Would just like for the wound to heal.  OBJECTIVE:   BP (!) 163/90    Pulse 87    Ht 5' 7.5" (1.715 m)    Wt (!) 300 lb 12.8 oz (136.4 kg)    LMP 04/10/2006    SpO2 97%    BMI 46.42 kg/m   General: Pleasant 56 year old female in no acute distress Cardiac: Regular rate and rhythm, no murmurs appreciated Respiratory: Normal work of breathing, speaking in full sentences MSK: Ambulating with Rollator Derm: Patient with wound on right inner thigh which appears unchanged from previous evaluation.  No erythema surrounding wound.  See image in media tab    ASSESSMENT/PLAN:   Decubitus ulcer Patient with cubitus ulcer of right buttocks.  Stable from previous evaluation.  Patient reports that she was unable to get the protective gauze from the previous visit.  Patient does report that she has been wearing cotton underwear which rubs against the wound and irritates it.  Recommended a barrier between cotton underwear and the wound using gauze.  Provided patient with gauze.  Follow-up in 2 to 4 weeks.  If continues to not improve patient may need wound care consult.  Strict ED and return precautions given.     [name], MD [place]

## 2021-02-07 NOTE — Therapy (Addendum)
OUTPATIENT PHYSICAL THERAPY THORACOLUMBAR EVALUATION/discharge    Patient Name: [name] MRN: [id number] DOB:[birth year], 56 y.o., female Today's Date: 02/08/2021   PT End of Session - 02/08/21 1255     Visit Number 1    Number of Visits 12    Date for PT Re-Evaluation 03/22/21    PT Start Time 0930    PT Stop Time 1013    PT Time Calculation (min) 43 min    Activity Tolerance Patient tolerated treatment well    Behavior During Therapy [facility] for tasks assessed/performed             Past Medical History:  Diagnosis Date   Alcoholism ([place])    Anxiety    Arthritis 04-10-11   hips, shoulders, back   Asthma    Bipolar disorder ([place])    Cardiomegaly    Carpal tunnel syndrome, bilateral    Cervical cancer ([place]) 1993   cervical, no treatment done, went away per pt   Cervical dysplasia or atypia 04-10-11   '93- once dx.-got pregnant-no intervention, then postpartum, no dysplasia found   CHF (congestive heart failure) ([place])    no cardiologist 2014 dx, none now   Chronic hypoxemic respiratory failure (HCC)    Chronic kidney disease    stage 3 kidney disease   Condyloma - gluteal cleft 04/09/2011   Removed by general surgery. Pathology showed Condyloma, gluteal CONDYLOMA ACUMINATUM.    COPD (chronic obstructive pulmonary disease) ([place])    Depression    Diabetes mellitus without complication ([place])    Dyspnea    with actity, sitting   Fall 12/16/2016   Fibrocystic breast disease    GERD (gastroesophageal reflux disease)    Grade I diastolic dysfunction 70/48/8891   Noted on ECHO   Hepatitis    hep B-count is low at present,doesn't register   History of PSVT (paroxysmal supraventricular tachycardia)    Hypercalcemia    Hyperlipidemia    Hypertension    Incomplete left bundle branch block (LBBB) 09/24/2018   Noted on EKG   Lung nodule 11/2017   right and left lung   Migraine headache    none recent   Morbid obesity ([place]) 03/27/2006   Neuropathy    Pneumonia     walking pneu 15 yrs. ago   Skin lesion 03/15/2011   In gluteal crease now s/p removal by Dr. [name] of General Surgery on 3/12. Path shows condyloma.      Sleep apnea 04-10-11   uses cpap, pt does not know settings   Trigger finger    left third   Urge incontinence of urine    Past Surgical History:  Procedure Laterality Date   ANAL FISTULECTOMY  04/17/2011   Procedure: FISTULECTOMY ANAL;  Surgeon: Imogene Burn. [name], MD;  Location: [facility];  Service: General;  Laterality: N/A;  Excision of Condyloma Gluteal Cleft    ANAL RECTAL MANOMETRY N/A 03/26/2019   Procedure: ANO RECTAL MANOMETRY;  Surgeon: Thornton Park, MD;  [facility];  Service: Gastroenterology;  Laterality: N/A;   BACK SURGERY  2009   pt thinks it was an "interbody fusion"   BIOPSY  03/09/2019   Procedure: BIOPSY;  Surgeon: Thornton Park, MD;  Location: [facility];  Service: Gastroenterology;;   BOTOX INJECTION N/A 12/29/2018   Procedure: BOTOX INJECTION(100 UNITS)  WITH CYSTOSCOPY;  Surgeon: Ceasar Mons, MD;  [facility];  Service: Urology;  Laterality: N/A;   BOTOX INJECTION N/A 07/27/2019   Procedure: BOTOX INJECTION WITH CYSTOSCOPY;  Surgeon: Ceasar Mons, MD;  [facility];  Service: Urology;  Laterality: N/A;  ONLY NEEDS 30 MIN   BREAST CYST EXCISION     bilateral breast, 3 cysts removed from each breast   BREAST EXCISIONAL BIOPSY Right    x 3   BREAST EXCISIONAL BIOPSY Left    x 3   CARPAL TUNNEL RELEASE Bilateral    Cervical biospy     CERVICAL FUSION  04/10/2011   x3- cervical fusion with plating and screws-Dr. [name]   COLONOSCOPY WITH PROPOFOL N/A 07/06/2015   Procedure: COLONOSCOPY WITH PROPOFOL;  Surgeon: [name], MD;  [facility];  Service: Endoscopy;  Laterality: N/A;   COLONOSCOPY WITH PROPOFOL N/A 03/09/2019   Procedure: COLONOSCOPY WITH PROPOFOL;  Surgeon: Thornton Park, MD;  [facility];  Service: Gastroenterology;   Laterality: N/A;   DOPPLER ECHOCARDIOGRAPHY  04/06/2012   AT [place] 55-60%   FOOT SURGERY Left 2018   FRACTURE SURGERY     little toe left foot   I & D EXTREMITY Left 12/18/2016   Procedure: IRRIGATION AND DEBRIDEMENT LEFT FOOT, CLOSURE;  Surgeon: [name], MD;  Location: [place];  Service: Orthopedics;  Laterality: Left;   INJECTION KNEE Right 10/03/2020   Procedure: KNEE INJECTION;  Surgeon: Jessy Oto, MD;  Location: [place];  Service: Orthopedics;  Laterality: Right;   LAPAROSCOPIC GASTRIC SLEEVE RESECTION N/A 05/08/2020   Procedure: LAPAROSCOPIC GASTRIC SLEEVE RESECTION;  Surgeon: Kieth Brightly Arta Bruce, MD;  [facility];  Service: General;  Laterality: N/A;   [place] SURGERY  04/10/2011   x5-Lumbar fusion-retained hardware.(Dr. Louanne Skye)   LUMBAR LAMINECTOMY N/A 10/03/2020   Procedure: THORACOLUMBAR LAMINECTOMIES THORACIC ELEVEN-TWELVE, THORACIC TWELVE-LUMBAR ONE  AND LUMBAT ONE-TWO;  Surgeon: Jessy Oto, MD;  Location: [place];  Service: Orthopedics;  Laterality: N/A;   MULTIPLE TOOTH EXTRACTIONS     POLYPECTOMY  03/09/2019   Procedure: POLYPECTOMY;  Surgeon: Thornton Park, MD;  [facility];  Service: Gastroenterology;;   SHOULDER ARTHROSCOPY WITH ROTATOR CUFF REPAIR AND SUBACROMIAL DECOMPRESSION Left 05/07/2019   Procedure: LEFT SHOULDER ARTHROSCOPY WITH DEBRIDEMENT, DISTAL CLAVICLE EXCISION,  SUBACROMIAL DECOMPRESSION AND POSSIBLE ROTATOR CUFF REPAIR;  Surgeon: [name], MD;  Location: [facility];  Service: Orthopedics;  Laterality: Left;   TEE WITHOUT CARDIOVERSION N/A 04/06/2012   Procedure: TRANSESOPHAGEAL ECHOCARDIOGRAM (TEE);  Surgeon: [name], MD;  Location: [facility];  Service: Cardiovascular;  Laterality: N/A;   TRIGGER FINGER RELEASE Right    middle finger   TRIGGER FINGER RELEASE Left 06/28/2016   Procedure: RELEASE TRIGGER FINGER LEFT 3RD FINGER;  Surgeon: [name], MD;  Location: [place];  Service: Orthopedics;   Laterality: Left;   TRIGGER FINGER RELEASE Right 06/12/2016   Procedure: RIGHT INDEX FINGER TRIGGER RELEASE;  Surgeon: [name], MD;  Location: [place];  Service: Orthopedics;  Laterality: Right;   TRIGGER FINGER RELEASE Right 01/19/2018   Procedure: RIGHT RING FINGER TRIGGER FINGER RELEASE;  Surgeon: [name], MD;  Location: [place];  Service: Orthopedics;  Laterality: Right;   TRIGGER FINGER RELEASE Left 05/07/2019   Procedure: RELEASE TRIGGER FINGER LEFT INDEX FINGER;  Surgeon: [name], MD;  Location: [place];  Service: Orthopedics;  Laterality: Left;   UPPER GI ENDOSCOPY N/A 05/08/2020   Procedure: UPPER GI ENDOSCOPY;  Surgeon: Kieth Brightly, Arta Bruce, MD;  [facility];  Service: General;  Laterality: N/A;   Patient Active Problem List   Diagnosis Date Noted  Impaired ambulation 01/10/2021   Status post lumbar laminectomy 10/03/2020   Cubital tunnel syndrome on left 05/02/2020   Vaginal discharge 01/20/2020   Prolapse of anterior vaginal wall 11/10/2019   Skin ulcer of left thigh, limited to breakdown of skin ([place]) 09/09/2019   Ulnar nerve compression 08/06/2019   Diabetic neuropathy associated with type 2 diabetes mellitus ([place]) 08/06/2019   Body mass index 50.0-59.9, adult ([place]) 06/12/2019   Impingement syndrome of left shoulder 05/07/2019   Polyp of colon    Polyp of ascending colon    Rectal discomfort 02/12/2019   Osteoarthritis of right hip 02/12/2019   Nontraumatic tear of left supraspinatus tendon 12/30/2018   Gout 12/17/2018   Nontraumatic complete tear of right rotator cuff 12/08/2018   Rotator cuff tear, right 12/01/2018   Tobacco abuse 08/21/2018   Right groin pain 07/20/2018   Depressed mood 07/13/2018   Paroxysmal SVT (supraventricular tachycardia) ([place]) 03/20/2018   Pure hypercholesterolemia 03/20/2018   Muscle cramping 02/25/2018   Nodule of upper lobe of right lung 02/13/2018   Falls, subsequent encounter 01/30/2018   Pain in left foot  11/04/2017   Essential hypertension 10/27/2017   Solitary pulmonary nodule 10/07/2017   Restrictive lung disease secondary to obesity 10/01/2017   Polycythemia, secondary 10/01/2017   Chronic viral hepatitis B without delta-agent ([place]) 07/08/2017   COPD (chronic obstructive pulmonary disease) with chronic bronchitis ([place]) 06/27/2017   Chronic kidney disease (CKD), stage IV (severe) ([place]) 01/14/2017   Decubitus ulcer 12/13/2016   Type 2 diabetes mellitus with stage 3 chronic kidney disease, with long-term current use of insulin ([place]) 12/12/2016   Urge incontinence of urine 12/05/2016   Trigger finger, left index finger 07/15/2016   Back pain 04/07/2014   Morbid obesity ([place]) 10/02/2012   Chronic diastolic CHF (congestive heart failure) ([place]) 04/07/2012   GERD 01/26/2010   Obstructive sleep apnea treated with BiPAP 02/03/2008   FIBROCYSTIC BREAST DISEASE 10/28/2006   Hyperlipidemia 03/27/2006   Obesity hypoventilation syndrome ([place]) 03/27/2006   Depression with anxiety 03/27/2006    PCP: [name], MD  REFERRING PROVIDER: Dr [name]  REFERRING DIAG: R29.898 (ICD-10-CM) - Right leg weakness  Z98.890 (ICD-10-CM) - Status post laminectomy     THERAPY DIAG:  Chronic bilateral low back pain without sciatica  Pain in right hip  Chronic pain of right knee  Other abnormalities of gait and mobility  Muscle weakness (generalized)  ONSET DATE: >10 years for her pain areas, Decompression was done on 10/03/2020  SUBJECTIVE:    Patient had a thoracolumbar decompression on 10/03/2021 of  THORACOLUMBAR LAMINECTOMIES THORACIC ELEVEN-TWELVE, THORACIC TWELVE-LUMBAR ONE  AND LUMBAT ONE-TWO. Sicne that point she continues to have weakness in her knees. She also his right hip OA. She had an injection yesterday. She also is having an MRI of her right knee. She is also having some neck pain. She has been using a walker since 2021.  PERTINENT HISTORY:  Anxiety, multi joint OA, Bi-polar Cerivcal caner 1993, CHF, respiratory failure ( was on O2 but came off ); DMII, Tachycardia ( remote); migraines ( remote); left foot surgery (2018), Back surgery 6x 3 neck surgeries;     PAIN:  Are you having pain? Yes NPRS scale: 10/10 Pain location: low back  Pain orientation: Bilateral and Lower Into buttocks and travels around into her anterior thighs PAIN TYPE: aching and burning Pain description: constant stabbed with pins and knives Aggravating factors: every 4 hours when her pain medicine wears off  Relieving factors: pain medications   PAIN:  Are you having pain? Yes VAS scale: 10/10 Pain location: right hip  Pain orientation: Right and Anterior  PAIN TYPE: aching, burning, dull, and sharp Pain description: constant  Aggravating factors: standing , walking  Relieving factors: pain medication  PAIN:  Are you having pain? Yes VAS scale: 10/10 Pain location: right "whole knee") Pain orientation: Right  PAIN TYPE: aching and burning Pain description: constant  Aggravating factors: standing and walking  Relieving factors: rest     PRECAUTIONS: Fall  WEIGHT BEARING RESTRICTIONS No  FALLS:  Has patient fallen in last 6 months? Yes, Number of falls: 1 fell in September. Her left leg gave out   LIVING ENVIRONMENT: 4 steps into her house with 1 ail on the left hand side   OCCUPATION: Not working   Hobbies: going to [employer], going to church   PLOF: Independent with household mobility with device  PATIENT GOALS  To get stronger    OBJECTIVE:   DIAGNOSTIC FINDINGS:  Right hip: Bone-on-bone joint space narrowing consistent with severe degenerative  joint disease.   SCREENING FOR RED FLAGS: Bowel or bladder incontinence: No Spinal tumors: No Cauda equina syndrome: No Compression  fracture: No Abdominal aneurysm: No  COGNITION:  Overall cognitive status: Within functional limits for tasks assessed     SENSATION:  Light touch: Appears intact  Stereognosis: Appears intact  Hot/Cold: Appears intact  Proprioception: Appears intact  Nerve damage in both of her feet    POSTURE:  Flexed posture in sitting forward head; rounded shoulders   PALPATION:   LUMBARAROM/PROM  Unable to test 2nd to baseline balance issues;    LE AROM/PROM:  MMT Right 02/08/2021 Left 02/08/2021  Hip flexion Unable to produce over 3 lbs  6.6  Hip extension    Hip abduction 7.1 9.3  Hip adduction    Hip internal rotation    Hip external rotation    Knee flexion    Knee extension 7.3  6.1  Ankle dorsiflexion    Ankle plantarflexion    Ankle inversion    Ankle eversion     (Blank rows = not tested)  PROM:  Not put in a supine position 2nd to significant lower back pain.   Balance:  Narrow base Min A   FUNCTIONAL TESTS:  5x sit to stand 19 seconds with handles  GAIT: Significant bilateral knee valgus   TODAY'S TREATMENT    PATIENT EDUCATION:     HOME EXERCISE PROGRAM: Access Code: [email or web address] URL: [email or web address] Date: 02/08/2021 Prepared by: Carolyne Littles  Exercises Seated Hip Adduction Isometrics with Diona Foley - 2 x daily - 7 x weekly - 3 sets - 10 reps Seated Hip Abduction with Resistance - 2 x daily - 7 x weekly - 3 sets - 10 reps  ASSESSMENT:  CLINICAL IMPRESSION: Patient is a 56 year old female with significant pain in multiple joints. She recently had  a thoraco-lumbar decompression on 9/6. She reports 6 surgeries on her back and 3 on her neck. She currently has bone on bone arthritis in right hip, as well as a RTC tear in her right shoulder. She reports she will be having that repaired at some point soon. She has significant weakness and muscle spasming in her right hip and gluteal. She has limited functional use of her right arm. She  uses her walker for general mobility. She would benefit from an aquatic program for grosss strengthening and functional mobility training.   Objective impairments include Abnormal gait, decreased activity tolerance, decreased endurance, decreased mobility, difficulty walking, decreased ROM, decreased strength, increased fascial restrictions, impaired perceived functional ability, increased muscle spasms, impaired flexibility, impaired UE functional use, improper body mechanics, postural dysfunction, obesity, and pain. These impairments are limiting patient from community activity, meal prep, laundry, school, and church. Personal factors including Anxiety, multi joint OA, Bi-polar Cerivcal caner 1993, CHF, respiratory failure ( was on O2 but came off ); DMII, Tachycardia ( remote); migraines ( remote); left foot surgery (2018), Back surgery 6x 3 neck surgeries;  are also affecting patient's functional outcome. Patient will benefit from skilled PT to address above impairments and improve overall function.  REHAB POTENTIAL: Fair long standing pain in several joints. To have a RTC repair soon per patient.   CLINICAL DECISION MAKING: Unstable/unpredictable significant pain in multiple joints that fluctuates with activity   EVALUATION COMPLEXITY: High   GOALS: Goals reviewed with patient? Yes  SHORT TERM GOALS:  STG Name Target Date Goal status  1 Patient will increase gross LE strength by 5 degrees Baseline:  03/01/2021 INITIAL  2 Patient will decrease 5x sit to stand by 5 seconds  Baseline:  03/01/2021 INITIAL  3 Patient will be independent with basic pool program  Baseline: 03/01/2021 INITIAL  LONG TERM GOALS:   LTG Name Target Date Goal status  1 Patient will transfer sit to stand without significant use of her hands without pain  Baseline: 03/22/2021 INITIAL  2 Patient will walk far enough to be able to return to church  Baseline: 03/22/2021 INITIAL  3 Patients FOTO score will be measured at 40%  in order to demonstrate improved functional movement  Baseline: 03/22/2021 INITIAL  PLAN: PT FREQUENCY: 2x/week  PT DURATION: 6 weeks  PLANNED INTERVENTIONS: Therapeutic exercises, Therapeutic activity, Neuro Muscular re-education, Balance training, Gait training, Patient/Family education, Joint mobilization, Aquatic Therapy, Dry Needling, Electrical stimulation, Cryotherapy, Moist heat, Taping, Ultrasound, Ionotophoresis 77m/ml Dexamethasone, and Manual therapy  PLAN FOR NEXT SESSION: begin light movement activity in the pool; review stretching ; begin hip and core strengthening; be aware that she has a RTC issue and may have surgery on that.  PHYSICAL THERAPY DISCHARGE SUMMARY  Visits from Start of Care: 1 Current functional level related to goals / functional outcomes: Patient came for water therapy but had some kind of wound develop that prevented her from gNazliniin the pool    Remaining deficits: Unknown did not return    Education / Equipment: HEP   Patient agrees to discharge. Patient goals were not met. Patient is being discharged due to  other medical issues .   DCarney LivingPT DPT  02/08/2021, 12:55 PM

## 2021-02-08 ENCOUNTER — Other Ambulatory Visit: Payer: Self-pay

## 2021-02-08 ENCOUNTER — Encounter: Payer: Self-pay | Admitting: Family Medicine

## 2021-02-08 ENCOUNTER — Ambulatory Visit (INDEPENDENT_AMBULATORY_CARE_PROVIDER_SITE_OTHER): Payer: 59 | Admitting: Family Medicine

## 2021-02-08 VITALS — BP 163/90 | HR 87 | Ht 67.5 in | Wt 300.8 lb

## 2021-02-08 DIAGNOSIS — L89312 Pressure ulcer of right buttock, stage 2: Secondary | ICD-10-CM

## 2021-02-08 NOTE — Patient Instructions (Signed)
It was great seeing you today!  I am sorry this wound is still bothering you.  I am giving you some gauze and some nonadherent pads that I want you to put between your underwear and the wound and keep it there.  I want to follow-up with you in 1 month to make sure it is healing.  Please let me know if it gets worse.  I hope you have a great day!

## 2021-02-09 ENCOUNTER — Ambulatory Visit (INDEPENDENT_AMBULATORY_CARE_PROVIDER_SITE_OTHER): Payer: 59 | Admitting: Specialist

## 2021-02-09 ENCOUNTER — Ambulatory Visit: Payer: 59 | Admitting: Specialist

## 2021-02-09 ENCOUNTER — Encounter: Payer: Self-pay | Admitting: Specialist

## 2021-02-09 ENCOUNTER — Ambulatory Visit (INDEPENDENT_AMBULATORY_CARE_PROVIDER_SITE_OTHER): Payer: 59

## 2021-02-09 VITALS — BP 148/90 | HR 93 | Ht 67.5 in | Wt 297.0 lb

## 2021-02-09 DIAGNOSIS — M4804 Spinal stenosis, thoracic region: Secondary | ICD-10-CM

## 2021-02-09 DIAGNOSIS — M4012 Other secondary kyphosis, cervical region: Secondary | ICD-10-CM

## 2021-02-09 DIAGNOSIS — M542 Cervicalgia: Secondary | ICD-10-CM

## 2021-02-09 DIAGNOSIS — M5124 Other intervertebral disc displacement, thoracic region: Secondary | ICD-10-CM

## 2021-02-09 DIAGNOSIS — M40294 Other kyphosis, thoracic region: Secondary | ICD-10-CM

## 2021-02-09 DIAGNOSIS — M5481 Occipital neuralgia: Secondary | ICD-10-CM

## 2021-02-09 NOTE — Progress Notes (Signed)
Office Visit Note   Patient: [name]           Date of Birth: Apr 23, 1965           MRN: [id number] Visit Date: 02/09/2021              Requested by: Concepcion Living, MD [place],  [place] [address] PCP: Alen Bleacher, MD   Assessment & Plan: Visit Diagnoses:  1. Cervicalgia   2. Thoracic spinal stenosis   3. Spinal stenosis, thoracic   4. Thoracic disc herniation   5. Other kyphosis of thoracic region   6. Other secondary kyphosis, cervical region   7. Occipital neuralgia of left side     Plan: Avoid bending, stooping and avoid lifting weights greater than 10 lbs. Avoid prolong standing and walking. Avoid frequent bending and stooping  No lifting greater than 10 lbs. May use ice or moist heat for pain. Weight loss is of benefit. Handicap license is approved. Dr. Romona Curls secretary/Assistant will call to arrange for evaluation and injeciton treatment of occipital neuralgia. Referral to Dr. Earnie Larsson for findings of Thoracolumbar stenosis and need for possible decompression    Follow-Up Instructions: No follow-ups on file.   Orders:  Orders Placed This Encounter  Procedures   XR Cervical Spine 2 or 3 views   Ambulatory referral to Physical Medicine Rehab   Ambulatory referral to Neurosurgery   No orders of the defined types were placed in this encounter.     Procedures: No procedures performed   Clinical Data: No additional findings.   Subjective: Chief Complaint  Patient presents with   Neck - Pain    56 year old female right handed with history of right hip pain. 100 % better following right hip intraarticular injection done about 3 days ago. She report the pain is still much less. She had thoracolumbar laminectomies for stenosis and walks with a walker. Complains mainly today of left neck pain.  There is pain with side to side and extension of the neck. Especially with turning to the left there is left side periauricular pain.  There is no arm numbness or paresthesias. The left shoulder is weak with difficulty raising the left arm. Dr. Erlinda Hong did previous left shoulder surgery. Has some bladder difficulty and is planning to have botox injection of the bladder by Dr. Minette Brine for bladder spasm. Without the walker she believes she would be stooped over and would loss her balance. There is no pain with cough of sneeze. No recent fall, fell September 22, getting out of car she slid to the ground post surgery on the back. The surgery is not helping much, she has problems with the knee and hip.    Review of Systems  Constitutional: Negative.   HENT: Negative.    Eyes: Negative.   Respiratory: Negative.    Cardiovascular: Negative.   Gastrointestinal: Negative.   Endocrine: Negative.   Genitourinary: Negative.   Musculoskeletal: Negative.   Skin: Negative.   Allergic/Immunologic: Negative.   Neurological: Negative.   Hematological: Negative.   Psychiatric/Behavioral: Negative.       Objective: Vital Signs: BP (!) 148/90 (BP Location: Left Arm, Patient Position: Sitting)   Pulse 93   Ht 5' 7.5" (1.715 m)   Wt 297 lb (134.7 kg)   LMP 01/29/1992   BMI 45.83 kg/m   Physical Exam Constitutional:      Appearance: She is well-developed.  HENT:     Head: Normocephalic  and atraumatic.  Eyes:     Pupils: Pupils are equal, round, and reactive to light.  Pulmonary:     Effort: Pulmonary effort is normal.     Breath sounds: Normal breath sounds.  Abdominal:     General: Bowel sounds are normal.     Palpations: Abdomen is soft.  Musculoskeletal:     Cervical back: Normal range of motion and neck supple.     Lumbar back: Negative right straight leg raise test and negative left straight leg raise test.  Skin:    General: Skin is warm and dry.  Neurological:     Mental Status: She is alert and oriented to person, place, and time.  Psychiatric:        Behavior: Behavior normal.        Thought Content:  Thought content normal.        Judgment: Judgment normal.     Back Exam   Tenderness  The patient is experiencing tenderness in the lumbar.  Range of Motion  Extension:  abnormal  Flexion:  abnormal  Lateral bend right:  abnormal  Lateral bend left:  abnormal  Rotation right:  abnormal  Rotation left:  abnormal   Muscle Strength  Right Quadriceps:  5/5  Left Quadriceps:  5/5  Right Hamstrings:  5/5  Left Hamstrings:  5/5   Tests  Straight leg raise right: negative Straight leg raise left: negative  Reflexes  Patellar:  0/4 Achilles:  0/4 Biceps:  0/4 Babinski's sign: normal   Other  Toe walk: normal Heel walk: normal Sensation: normal Erythema: no back redness Scars: absent  Comments:  Left triceps weakness 4/5, Left finger extension weakness 5-/5 Wrist VF is normal.      Specialty Comments:  No specialty comments available.  Imaging: No results found.   PMFS History: Patient Active Problem List   Diagnosis Date Noted   Infection of lip 08/27/2021   Lumbar stenosis with neurogenic claudication 06/29/2021   Lumbar radiculopathy 06/29/2021   Biceps tendinopathy of right upper extremity 05/03/2021   Irregular heartbeat 04/11/2021   Neuropathic pain of foot 03/28/2021   Chronic congestion of paranasal sinus 03/28/2021   Impingement syndrome of right shoulder 03/08/2021   Arthritis of right acromioclavicular joint 03/08/2021   Acute medial meniscus tear of right knee 03/08/2021   Acute lateral meniscus tear of right knee 03/08/2021   Spinal stenosis of thoracic region with radiculopathy    Bilateral leg numbness    Spinal stenosis of lumbar region with neurogenic claudication    Unilateral primary osteoarthritis, right knee    Impaired ambulation 01/10/2021   Status post lumbar laminectomy 10/03/2020   Cubital tunnel syndrome on left 05/02/2020   Vaginal discharge 01/20/2020   Prolapse of anterior vaginal wall 11/10/2019   Skin ulcer of left  thigh, limited to breakdown of skin ([place]) 09/09/2019   Ulnar nerve compression 08/06/2019   Diabetic neuropathy associated with type 2 diabetes mellitus ([place]) 08/06/2019   Body mass index 50.0-59.9, adult ([place]) 06/12/2019   Impingement syndrome of left shoulder 05/07/2019   Polyp of colon    Polyp of ascending colon    Rectal discomfort 02/12/2019   Osteoarthritis of right hip 02/12/2019   Nontraumatic tear of left supraspinatus tendon 12/30/2018   Gout 12/17/2018   Nontraumatic complete tear of right rotator cuff 12/08/2018   Rotator cuff tear, right 12/01/2018   Tobacco abuse 08/21/2018   Right groin pain 07/20/2018   Depressed mood 07/13/2018   Paroxysmal SVT (  supraventricular tachycardia) ([place]) 03/20/2018   Pure hypercholesterolemia 03/20/2018   Muscle cramping 02/25/2018   Nodule of upper lobe of right lung 02/13/2018   Falls, subsequent encounter 01/30/2018   Pain in left foot 11/04/2017   Essential hypertension 10/27/2017   Solitary pulmonary nodule 10/07/2017   Restrictive lung disease secondary to obesity 10/01/2017   Polycythemia, secondary 10/01/2017   Chronic viral hepatitis B without delta-agent ([place]) 07/08/2017   COPD (chronic obstructive pulmonary disease) with chronic bronchitis ([place]) 06/27/2017   Chronic kidney disease (CKD), stage IV (severe) ([place]) 01/14/2017   Decubitus ulcer 12/13/2016   Type 2 diabetes mellitus with stage 3 chronic kidney disease, with long-term current use of insulin ([place]) 12/12/2016   Urge incontinence of urine 12/05/2016   Trigger finger, left index finger 07/15/2016   Back pain 04/07/2014   Morbid obesity ([place]) 10/02/2012   Chronic diastolic CHF (congestive heart failure) ([place]) 04/07/2012   Skin lesion of scalp 03/15/2011   GERD 01/26/2010   Obstructive sleep apnea treated with BiPAP 02/03/2008   FIBROCYSTIC BREAST DISEASE 10/28/2006   Hyperlipidemia 03/27/2006   Obesity hypoventilation syndrome ([place]) 03/27/2006   Depression with  anxiety 03/27/2006   Past Medical History:  Diagnosis Date   Alcoholism ([place])    Anxiety    Arthritis 04/10/2011   hips, shoulders, back   Asthma    Bipolar disorder ([place])    Cardiomegaly    Carpal tunnel syndrome, bilateral    Cervical cancer ([place]) 1993   cervical, no treatment done, went away per pt   Cervical dysplasia or atypia 04/10/2011   '93- once dx.-got pregnant-no intervention, then postpartum, no dysplasia found   CHF (congestive heart failure) ([place])    no cardiologist 2014 dx, none now   Chronic hypoxemic respiratory failure ([place])    Chronic kidney disease    stage 3 kidney disease   Condyloma - gluteal cleft 04/09/2011   Removed by general surgery. Pathology showed Condyloma, gluteal CONDYLOMA ACUMINATUM.    COPD (chronic obstructive pulmonary disease) ([place])    Depression    Diabetes mellitus without complication ([place])    no meds today   Dyspnea    with actity, sitting   Fall 12/16/2016   Fibrocystic breast disease    GERD (gastroesophageal reflux disease)    Grade I diastolic dysfunction 50/09/3816   Noted on ECHO   Hepatitis    hep B-count is low at present,doesn't register   History of PSVT (paroxysmal supraventricular tachycardia)    Hypercalcemia    Hyperlipidemia    Hypertension    Incomplete left bundle branch block (LBBB) 09/24/2018   Noted on EKG   Lung nodule 11/2017   right and left lung   Migraine headache    none recent   Morbid obesity ([place]) 03/27/2006   Neuropathy    Pneumonia    walking pneu 15 yrs. ago   Skin lesion 03/15/2011   In gluteal crease now s/p removal by Dr. [name] of General Surgery on 3/12. Path shows condyloma.      Sleep apnea 04/10/2011   does not use after loss   Trigger finger    left third   Urge incontinence of urine     Family History  Problem Relation Age of Onset   Pulmonary embolism Mother    Stroke Father    Alcohol abuse Father    Pneumonia Father    Hypertension Father    Breast cancer Maternal  Aunt    Hypertension Other    Diabetes  Other        Aunts and cousins   Asthma Sister    Depression Sister    Arthritis Sister    Sickle cell anemia Son     Past Surgical History:  Procedure Laterality Date   ANAL FISTULECTOMY  04/17/2011   Procedure: FISTULECTOMY ANAL;  Surgeon: Imogene Burn. [name], MD;  Location: [facility];  Service: General;  Laterality: N/A;  Excision of Condyloma Gluteal Cleft    ANAL RECTAL MANOMETRY N/A 03/26/2019   Procedure: ANO RECTAL MANOMETRY;  Surgeon: Thornton Park, MD;  [facility];  Service: Gastroenterology;  Laterality: N/A;   BACK SURGERY  2009   pt thinks it was an "interbody fusion"   BIOPSY  03/09/2019   Procedure: BIOPSY;  Surgeon: Thornton Park, MD;  Location: [facility];  Service: Gastroenterology;;   BOTOX INJECTION N/A 12/29/2018   Procedure: BOTOX INJECTION(100 UNITS)  WITH CYSTOSCOPY;  Surgeon: Ceasar Mons, MD;  [facility];  Service: Urology;  Laterality: N/A;   BOTOX INJECTION N/A 07/27/2019   Procedure: BOTOX INJECTION WITH CYSTOSCOPY;  Surgeon: Ceasar Mons, MD;  [facility];  Service: Urology;  Laterality: N/A;  ONLY NEEDS 30 MIN   BOTOX INJECTION N/A 05/02/2021   Procedure: BOTOX INJECTION WITH CYSTOSCOPY;  Surgeon: Ceasar Mons, MD;  Location[facility];  Service: Urology;  Laterality: N/A;  ONLY NEEDS 30 MIN   BREAST CYST EXCISION     bilateral breast, 3 cysts removed from each breast   BREAST EXCISIONAL BIOPSY Right    x 3   BREAST EXCISIONAL BIOPSY Left    x 3   CARPAL TUNNEL RELEASE Bilateral    Cervical biospy     CERVICAL FUSION  04/10/2011   x3- cervical fusion with plating and screws-Dr. [name]   COLONOSCOPY WITH PROPOFOL N/A 07/06/2015   Procedure: COLONOSCOPY WITH PROPOFOL;  Surgeon: [name], MD;  [facility];  Service: Endoscopy;  Laterality: N/A;   COLONOSCOPY WITH PROPOFOL N/A 03/09/2019   Procedure: COLONOSCOPY WITH  PROPOFOL;  Surgeon: Thornton Park, MD;  [facility];  Service: Gastroenterology;  Laterality: N/A;   DOPPLER ECHOCARDIOGRAPHY  04/06/2012   AT [place] 55-60%   FOOT SURGERY Left 2018   FRACTURE SURGERY     little toe left foot   I & D EXTREMITY Left 12/18/2016   Procedure: IRRIGATION AND DEBRIDEMENT LEFT FOOT, CLOSURE;  Surgeon: [name], MD;  Location: [facility];  Service: Orthopedics;  Laterality: Left;   INJECTION KNEE Right 10/03/2020   Procedure: KNEE INJECTION;  Surgeon: Jessy Oto, MD;  Location: [facility];  Service: Orthopedics;  Laterality: Right;   KNEE ARTHROSCOPY WITH MEDIAL MENISECTOMY Right 03/21/2021   Procedure: RIGHT KNEE ARTHROSCOPY WITH MEDIAL AND PARTIAL LATERAL MENISCECTOMY;  Surgeon: [name], MD;  Location: [facility];  Service: Orthopedics;  Laterality: Right;   LAPAROSCOPIC GASTRIC SLEEVE RESECTION N/A 05/08/2020   Procedure: LAPAROSCOPIC GASTRIC SLEEVE RESECTION;  Surgeon: Kieth Brightly Arta Bruce, MD;  [facility];  Service: General;  Laterality: N/A;   [place] SURGERY  04/10/2011   x5-Lumbar fusion-retained hardware.(Dr. Louanne Skye)   LUMBAR LAMINECTOMY N/A 10/03/2020   Procedure: THORACOLUMBAR LAMINECTOMIES THORACIC ELEVEN-TWELVE, THORACIC TWELVE-LUMBAR ONE  AND LUMBAT ONE-TWO;  Surgeon: Jessy Oto, MD;  Location: [facility];  Service: Orthopedics;  Laterality: N/A;   LUMBAR PERCUTANEOUS PEDICLE SCREW 2 LEVEL  06/29/2021   Procedure: LUMBAR PERCUTANEOUS PEDICLE SCREW LUMBAR ONE-TWO;  Surgeon: Earnie Larsson,  MD;  Location: [facility];  Service: Neurosurgery;;   MULTIPLE TOOTH EXTRACTIONS     POLYPECTOMY  03/09/2019   Procedure: POLYPECTOMY;  Surgeon: Thornton Park, MD;  [facility];  Service: Gastroenterology;;   SHOULDER ARTHROSCOPY WITH ROTATOR CUFF REPAIR AND SUBACROMIAL DECOMPRESSION Left 05/07/2019   Procedure: LEFT SHOULDER ARTHROSCOPY WITH DEBRIDEMENT, DISTAL CLAVICLE EXCISION,  SUBACROMIAL DECOMPRESSION AND  POSSIBLE ROTATOR CUFF REPAIR;  Surgeon: [name], MD;  Location: [facility];  Service: Orthopedics;  Laterality: Left;   TEE WITHOUT CARDIOVERSION N/A 04/06/2012   Procedure: TRANSESOPHAGEAL ECHOCARDIOGRAM (TEE);  Surgeon: [name], MD;  Location[facility];  Service: Cardiovascular;  Laterality: N/A;   TRIGGER FINGER RELEASE Right    middle finger   TRIGGER FINGER RELEASE Left 06/28/2016   Procedure: RELEASE TRIGGER FINGER LEFT 3RD FINGER;  Surgeon: [name], MD;  Location: [facility];  Service: Orthopedics;  Laterality: Left;   TRIGGER FINGER RELEASE Right 06/12/2016   Procedure: RIGHT INDEX FINGER TRIGGER RELEASE;  Surgeon: [name], MD;  Location: [facility];  Service: Orthopedics;  Laterality: Right;   TRIGGER FINGER RELEASE Right 01/19/2018   Procedure: RIGHT RING FINGER TRIGGER FINGER RELEASE;  Surgeon: [name], MD;  Location: [facility];  Service: Orthopedics;  Laterality: Right;   TRIGGER FINGER RELEASE Left 05/07/2019   Procedure: RELEASE TRIGGER FINGER LEFT INDEX FINGER;  Surgeon: [name], MD;  Location: [facility];  Service: Orthopedics;  Laterality: Left;   UPPER GI ENDOSCOPY N/A 05/08/2020   Procedure: UPPER GI ENDOSCOPY;  Surgeon: Kieth Brightly, Arta Bruce, MD;  [facility];  Service: General;  Laterality: N/A;   Social History   Occupational History   Occupation: disabled    Comment: Disabled  Tobacco Use   Smoking status: Every Day    Packs/day: 1.00    Years: 40.00    Total pack years: 40.00    Types: Cigarettes    Start date: 01/28/1977   Smokeless tobacco: Never  Vaping Use   Vaping Use: Never used  Substance and Sexual Activity   Alcohol use: No    Alcohol/week: 0.0 standard drinks of alcohol   Drug use: Not Currently    Types: Marijuana, "Crack" cocaine    Comment: hx marijuana usemany years ago, Crack -none since 11/2015   Sexual activity: Not Currently    Partners: Male    Birth control/protection: Post-menopausal

## 2021-02-09 NOTE — Assessment & Plan Note (Addendum)
Patient with cubitus ulcer of right buttocks.  Stable from previous evaluation.  Patient reports that she was unable to get the protective gauze from the previous visit.  Patient does report that she has been wearing cotton underwear which rubs against the wound and irritates it.  Recommended a barrier between cotton underwear and the wound using gauze.  Provided patient with gauze.  Follow-up in 2 to 4 weeks.  If continues to not improve patient may need wound care consult.  Strict ED and return precautions given.

## 2021-02-12 ENCOUNTER — Encounter: Payer: Self-pay | Admitting: Orthopaedic Surgery

## 2021-02-12 DIAGNOSIS — M1711 Unilateral primary osteoarthritis, right knee: Secondary | ICD-10-CM

## 2021-02-12 DIAGNOSIS — M4804 Spinal stenosis, thoracic region: Secondary | ICD-10-CM

## 2021-02-12 DIAGNOSIS — M48062 Spinal stenosis, lumbar region with neurogenic claudication: Secondary | ICD-10-CM

## 2021-02-12 DIAGNOSIS — R200 Anesthesia of skin: Secondary | ICD-10-CM

## 2021-02-12 DIAGNOSIS — M5414 Radiculopathy, thoracic region: Secondary | ICD-10-CM

## 2021-02-12 NOTE — Telephone Encounter (Signed)
I'm not aware of the need for a knee MRI.

## 2021-02-14 ENCOUNTER — Other Ambulatory Visit: Payer: Self-pay

## 2021-02-14 ENCOUNTER — Encounter: Payer: Self-pay | Admitting: Specialist

## 2021-02-14 DIAGNOSIS — M25561 Pain in right knee: Secondary | ICD-10-CM

## 2021-02-14 NOTE — Telephone Encounter (Signed)
Ok that's fine.  Let's do both

## 2021-02-14 NOTE — Telephone Encounter (Signed)
MRI ordered. Can this be done at the same time?

## 2021-02-15 ENCOUNTER — Encounter (HOSPITAL_BASED_OUTPATIENT_CLINIC_OR_DEPARTMENT_OTHER): Payer: Self-pay

## 2021-02-15 ENCOUNTER — Encounter (HOSPITAL_BASED_OUTPATIENT_CLINIC_OR_DEPARTMENT_OTHER): Payer: Self-pay | Admitting: Physical Therapy

## 2021-02-15 ENCOUNTER — Encounter (HOSPITAL_BASED_OUTPATIENT_CLINIC_OR_DEPARTMENT_OTHER): Payer: Self-pay | Admitting: Cardiology

## 2021-02-15 ENCOUNTER — Ambulatory Visit (HOSPITAL_BASED_OUTPATIENT_CLINIC_OR_DEPARTMENT_OTHER): Payer: 59 | Admitting: Physical Therapy

## 2021-02-20 ENCOUNTER — Encounter: Payer: Self-pay | Admitting: Family Medicine

## 2021-02-20 ENCOUNTER — Telehealth: Payer: Self-pay

## 2021-02-20 DIAGNOSIS — Z720 Tobacco use: Secondary | ICD-10-CM

## 2021-02-20 NOTE — Telephone Encounter (Addendum)
Patient calls nurse line requesting to speak with Dr. Valentina Lucks in regards to Chantix. Patient reports she can not take the generic due to GI upset. Patient reports she was told they only make the generic now. Patient reports she has tried "everything," including the patch and lozenges. Patient states, "I am really trying to quit smoking."  Will forward to [facility] for advisement.

## 2021-02-21 ENCOUNTER — Other Ambulatory Visit: Payer: Self-pay | Admitting: Family Medicine

## 2021-02-21 MED ORDER — NICOTINE 21 MG/24HR TD PT24: 21.0000 mg | MEDICATED_PATCH | Freq: Every day | TRANSDERMAL | 2 refills | Status: DC

## 2021-02-21 NOTE — Telephone Encounter (Signed)
Patient contacted for follow/up of tobacco intake reduction / tobacco cessation attempt.   Since last contact patient reports continued smoking of 1 ppd. Medications currently being used; None - states she cannot tolerate the generic Chantix due to GI side effects.   Rates IMPORTANCE of quitting tobacco on 1-10 is very high.  Most common triggers to use tobacco include; Pain and stress   Motivation to quit: Health and Breathing, Wound Healing  Patient willing to try Nicotine patches again.  New Rx provide.   Total time with patient call and documentation of interaction: 11 minutes.  F/U Phone call planned: 2weeks

## 2021-02-21 NOTE — Telephone Encounter (Signed)
Called patient to schedule appointment. Patient states that she only wants to see Dr. Caron Presume. Advised that provider is currently working night shift and does not have appointments until Feb. 8th. Patient states that she will wait until then for appointment.   Patient reports that she has been having this yellow drainage for the last three days. Denies redness, swelling, fever or chills. Advised patient that if she were to develop these symptoms she would need to be seen sooner. Advised patient to continue current wound care regimen.  Patient verbalizes understanding.   Will route to PCP for recommendations in the meantime.   Talbot Grumbling, RN

## 2021-02-21 NOTE — Assessment & Plan Note (Signed)
Patient contacted for follow/up of tobacco intake reduction / tobacco cessation attempt.   Since last contact patient reports continued smoking of 1 ppd. Medications currently being used; None - states she cannot tolerate the generic Chantix due to GI side effects.   Rates IMPORTANCE of quitting tobacco on 1-10 is very high.  Most common triggers to use tobacco include; Pain and stress   Motivation to quit: Health and Breathing, Wound Healing  Patient willing to try Nicotine patches again.  New Rx provide.

## 2021-02-21 NOTE — Telephone Encounter (Signed)
Noted and agree. 

## 2021-02-23 ENCOUNTER — Encounter: Payer: Self-pay | Admitting: Specialist

## 2021-02-27 ENCOUNTER — Other Ambulatory Visit: Payer: Self-pay | Admitting: Urology

## 2021-03-01 ENCOUNTER — Other Ambulatory Visit: Payer: Self-pay

## 2021-03-01 ENCOUNTER — Telehealth: Payer: Self-pay | Admitting: Pharmacist

## 2021-03-01 ENCOUNTER — Ambulatory Visit (HOSPITAL_BASED_OUTPATIENT_CLINIC_OR_DEPARTMENT_OTHER): Payer: Self-pay | Admitting: Physical Therapy

## 2021-03-01 ENCOUNTER — Encounter: Payer: Self-pay | Admitting: Family Medicine

## 2021-03-01 ENCOUNTER — Ambulatory Visit
Admission: RE | Admit: 2021-03-01 | Discharge: 2021-03-01 | Disposition: A | Discharge status: Home / Self Care | Payer: 59 | Source: Ambulatory Visit | Attending: Orthopaedic Surgery | Admitting: Orthopaedic Surgery

## 2021-03-01 DIAGNOSIS — M75111 Incomplete rotator cuff tear or rupture of right shoulder, not specified as traumatic: Secondary | ICD-10-CM

## 2021-03-01 DIAGNOSIS — M25561 Pain in right knee: Secondary | ICD-10-CM

## 2021-03-01 DIAGNOSIS — Z720 Tobacco use: Secondary | ICD-10-CM

## 2021-03-01 NOTE — Assessment & Plan Note (Signed)
Patient contacted for follow/up of tobacco intake reduction.  Since last contact patient reports reduced intake of cigarettes but continues to use both 21 mg nicotine patches and multiple cigarettes per day.   She believe the patches are decreasing her cravings.   Medications currently being used;  Nicotine patch - 21mg  Patient denies any significant side effects from tobacco cessation therapy.  Rates IMPORTANCE of quitting tobacco as high.  Rates CONFIDENCE of quitting tobacco as high. Most common triggers to use tobacco include; habit and stress  Motivation to quit: health

## 2021-03-01 NOTE — Telephone Encounter (Signed)
-----   Message from Leavy Cella, [place] sent at 02/06/2021  3:03 PM EST ----- Regarding: Tobacco Intake reduction - plan to decrease from 20 per day to 10 per day with restart of varenicline

## 2021-03-01 NOTE — Telephone Encounter (Signed)
Noted and agree. 

## 2021-03-01 NOTE — Telephone Encounter (Signed)
Patient contacted for follow/up of tobacco intake reduction.  Since last contact patient reports reduced intake of cigarettes but continues to use both 21 mg nicotine patches and multiple cigarettes per day.   She believe the patches are decreasing her cravings.   Medications currently being used;  Nicotine patch - 21mg  Patient denies any significant side effects from tobacco cessation therapy.  Rates IMPORTANCE of quitting tobacco as high.  Rates CONFIDENCE of quitting tobacco as high. Most common triggers to use tobacco include; habit and stress  Motivation to quit: health  Total time with patient call and documentation of interaction: 11 minutes.  F/U Phone call planned: 2 weeks (sees PCP, [place] 03/07/2021)   I would like to see her in person during that visit if possible.

## 2021-03-03 ENCOUNTER — Ambulatory Visit (HOSPITAL_COMMUNITY): Payer: 59

## 2021-03-05 ENCOUNTER — Ambulatory Visit (HOSPITAL_BASED_OUTPATIENT_CLINIC_OR_DEPARTMENT_OTHER): Payer: Self-pay | Admitting: Physical Therapy

## 2021-03-07 ENCOUNTER — Ambulatory Visit (INDEPENDENT_AMBULATORY_CARE_PROVIDER_SITE_OTHER): Payer: 59 | Admitting: Family Medicine

## 2021-03-07 ENCOUNTER — Other Ambulatory Visit: Payer: Self-pay

## 2021-03-07 ENCOUNTER — Ambulatory Visit (HOSPITAL_BASED_OUTPATIENT_CLINIC_OR_DEPARTMENT_OTHER): Payer: Self-pay | Admitting: Physical Therapy

## 2021-03-07 ENCOUNTER — Encounter: Payer: Self-pay | Admitting: Family Medicine

## 2021-03-07 ENCOUNTER — Ambulatory Visit (INDEPENDENT_AMBULATORY_CARE_PROVIDER_SITE_OTHER): Payer: 59

## 2021-03-07 VITALS — BP 144/95 | HR 92 | Ht 67.0 in | Wt 294.4 lb

## 2021-03-07 DIAGNOSIS — L89312 Pressure ulcer of right buttock, stage 2: Secondary | ICD-10-CM | POA: Diagnosis not present

## 2021-03-07 DIAGNOSIS — Z23 Encounter for immunization: Secondary | ICD-10-CM | POA: Diagnosis not present

## 2021-03-07 NOTE — Progress Notes (Addendum)
SAM, WUNSCHEL ([id number]) Visit Report for 03/08/2021 Allergy List Details Patient Name: Date of Service: [name] I. 05/30/9765 3:41 PM Medical Record Number: [id number] Patient Account Number: [id number] Date of Birth/Sex: Treating RN: 1965/05/02 (56 y.o. [name] Primary Care Jovanie Verge: York Ram Other Clinician: Referring Madisynn Plair: Treating Harve Spradley/Extender: Ronnie Derby, Carina Weeks in Treatment: 0 Allergies Active Allergies aspirin Reaction: Other Severity: Moderate Active: 01/27/2018 methadone HCl Reaction: intolerance Severity: Moderate Active: 08/19/2006 Corticosteroids (Glucocorticoids) Reaction: other Severity: Mild Active: 01/16/2018 levofloxacin Reaction: itching Severity: Mild Active: 08/20/2007 Allergy Notes Electronic Signature(s) Signed: 03/08/2021 6:03:12 PM By: Baruch Gouty RN, BSN Previous Signature: 03/07/2021 3:12:32 PM Version By: Donavan Burnet CHT EMT BS , , Entered By: Baruch Gouty on 03/08/2021 13:34:48 -------------------------------------------------------------------------------- Arrival Information Details Patient Name: Date of Service: [name] I. 07/31/5327 9:24 PM Medical Record Number: [id number] Patient Account Number: [id number] Date of Birth/Sex: Treating RN: [birth year] (56 y.o. [name] Primary Care Ramie Palladino: York Ram Other Clinician: Referring Camesha Farooq: Treating Jaziah Kwasnik/Extender: Fonnie Jarvis in Treatment: 0 Visit Information Patient Arrived: Charlyn Minerva Time: 13:27 Accompanied By: self Transfer Assistance: None Patient Identification Verified: Yes Secondary Verification Process Completed: Yes Patient Requires Transmission-Based Precautions: No Patient Has Alerts: No Electronic Signature(s) Signed: 03/08/2021 6:03:12 PM By: Baruch Gouty RN, BSN Entered By: Baruch Gouty on 03/08/2021  13:32:54 -------------------------------------------------------------------------------- Clinic Level of Care Assessment Details Patient Name: Date of Service: [name] I. 03/01/9796 9:21 PM Medical Record Number: [id number] Patient Account Number: [id number] Date of Birth/Sex: Treating RN: January 13, 1966 (56 y.o. [name] Primary Care Rommie Dunn: York Ram Other Clinician: Referring Namiko Pritts: Treating Arius Harnois/Extender: Fonnie Jarvis in Treatment: 0 Clinic Level of Care Assessment Items TOOL 2 Quantity Score []  - 0 Use when only an EandM is performed on the INITIAL visit ASSESSMENTS - Nursing Assessment / Reassessment X- 1 20 General Physical Exam (combine w/ comprehensive assessment (listed just below) when performed on new pt. evals) X- 1 25 Comprehensive Assessment (HX, ROS, Risk Assessments, Wounds Hx, etc.) ASSESSMENTS - Wound and Skin A ssessment / Reassessment X - Simple Wound Assessment / Reassessment - one wound 1 5 []  - 0 Complex Wound Assessment / Reassessment - multiple wounds []  - 0 Dermatologic / Skin Assessment (not related to wound area) ASSESSMENTS - Ostomy and/or Continence Assessment and Care []  - 0 Incontinence Assessment and Management []  - 0 Ostomy Care Assessment and Management (repouching, etc.) PROCESS - Coordination of Care X - Simple Patient / Family Education for ongoing care 1 15 []  - 0 Complex (extensive) Patient / Family Education for ongoing care X- 1 10 Staff obtains Programmer, systems, Records, T Results / Process Orders est []  - 0 Staff telephones HHA, Nursing Homes / Clarify orders / etc []  - 0 Routine Transfer to another Facility (non-emergent condition) []  - 0 Routine Hospital Admission (non-emergent condition) X- 1 15 New Admissions / Biomedical engineer / Ordering NPWT Apligraf, etc. , []  - 0 Emergency Hospital Admission (emergent condition) X- 1 10 Simple Discharge Coordination []  -  0 Complex (extensive) Discharge Coordination PROCESS - Special Needs []  - 0 Pediatric / Minor Patient Management []  - 0 Isolation Patient Management []  - 0 Hearing / Language / Visual special needs []  - 0 Assessment of Community assistance (transportation, D/C planning, etc.) []  - 0 Additional assistance / Altered mentation []  - 0 Support Surface(s) Assessment (bed, cushion, seat, etc.) INTERVENTIONS - Wound Cleansing / Measurement X- 1 5 Wound Imaging (  photographs - any number of wounds) []  - 0 Wound Tracing (instead of photographs) X- 1 5 Simple Wound Measurement - one wound []  - 0 Complex Wound Measurement - multiple wounds X- 1 5 Simple Wound Cleansing - one wound []  - 0 Complex Wound Cleansing - multiple wounds INTERVENTIONS - Wound Dressings X - Small Wound Dressing one or multiple wounds 1 10 []  - 0 Medium Wound Dressing one or multiple wounds []  - 0 Large Wound Dressing one or multiple wounds []  - 0 Application of Medications - injection INTERVENTIONS - Miscellaneous []  - 0 External ear exam []  - 0 Specimen Collection (cultures, biopsies, blood, body fluids, etc.) []  - 0 Specimen(s) / Culture(s) sent or taken to Lab for analysis []  - 0 Patient Transfer (multiple staff / Civil Service fast streamer / Similar devices) []  - 0 Simple Staple / Suture removal (25 or less) []  - 0 Complex Staple / Suture removal (26 or more) []  - 0 Hypo / Hyperglycemic Management (close monitor of Blood Glucose) []  - 0 Ankle / Brachial Index (ABI) - do not check if billed separately Has the patient been seen at the hospital within the last three years: Yes Total Score: 125 Level Of Care: New/Established - Level 4 Electronic Signature(s) Signed: 03/08/2021 6:03:12 PM By: Baruch Gouty RN, BSN Entered By: Baruch Gouty on 03/08/2021 14:15:37 -------------------------------------------------------------------------------- Encounter Discharge Information Details Patient Name: Date of  Service: [name] I. 03/31/3543 6:25 PM Medical Record Number: [id number] Patient Account Number: [id number] Date of Birth/Sex: Treating RN: 19-Jun-1965 (56 y.o. [name] Primary Care Emerlyn Mehlhoff: York Ram Other Clinician: Referring Dajane Valli: Treating Mario Coronado/Extender: Fonnie Jarvis in Treatment: 0 Encounter Discharge Information Items Post Procedure Vitals Discharge Condition: Stable Temperature (F): 98.3 Ambulatory Status: Walker Pulse (bpm): 95 Discharge Destination: Home Respiratory Rate (breaths/min): 20 Transportation: Private Auto Blood Pressure (mmHg): 136/80 Accompanied By: self Schedule Follow-up Appointment: Yes Clinical Summary of Care: Patient Declined Electronic Signature(s) Signed: 03/08/2021 6:03:12 PM By: Baruch Gouty RN, BSN Entered By: Baruch Gouty on 03/08/2021 14:33:22 -------------------------------------------------------------------------------- Lower Extremity Assessment Details Patient Name: Date of Service: [name] I. 09/03/6809 5:72 PM Medical Record Number: [id number] Patient Account Number: [id number] Date of Birth/Sex: Treating RN: November 19, 1965 (56 y.o. [name] Primary Care Amulya Quintin: York Ram Other Clinician: Referring Tayt Moyers: Treating Azrielle Springsteen/Extender: Ronnie Derby, Janine Limbo in Treatment: 0 Electronic Signature(s) Signed: 03/08/2021 6:03:12 PM By: Baruch Gouty RN, BSN Entered By: Baruch Gouty on 03/08/2021 13:50:45 -------------------------------------------------------------------------------- Multi Wound Chart Details Patient Name: Date of Service: [name] I. 02/03/3843 3:64 PM Medical Record Number: [id number] Patient Account Number: [id number] Date of Birth/Sex: Treating RN: [birth year] (56 y.o. Martyn Malay, Linda Primary Care Blandon Offerdahl: York Ram Other Clinician: Referring Zygmunt Mcglinn: Treating Karisa Nesser/Extender: Ronnie Derby, Janine Limbo in Treatment: 0 Vital Signs Height(in): 72 Capillary Blood Glucose(mg/dl): 126 Weight(lbs): 294 Pulse(bpm): 95 Body Mass Index(BMI): 43.4 Blood Pressure(mmHg): 136/80 Temperature(F): 98.3 Respiratory Rate(breaths/min): 20 Photos: [N/A:N/A] Right Gluteus N/A N/A Wound Location: Bump N/A N/A Wounding Event: Pressure Ulcer N/A N/A Primary Etiology: Cataracts, Chronic sinus N/A N/A Comorbid History: problems/congestion, Sickle Cell Disease, Chronic Obstructive Pulmonary Disease (COPD), Sleep Apnea, Congestive Heart Failure, Hypertension, Hepatitis B, Type II Diabetes, Gout, Osteoarthritis, Neuropathy 09/28/2020 N/A N/A Date Acquired: 0 N/A N/A Weeks of Treatment: Open N/A N/A Wound Status: No N/A N/A Wound Recurrence: 1x1.4x0.1 N/A N/A Measurements L x W x D (cm) 1.1 N/A N/A A (cm) : rea 0.11 N/A N/A Volume (cm) : 0.00%  N/A N/A % Reduction in Area: 0.00% N/A N/A % Reduction in Volume: Category/Stage III N/A N/A Classification: Medium N/A N/A Exudate Amount: Serosanguineous N/A N/A Exudate Type: red, brown N/A N/A Exudate Color: Distinct, outline attached N/A N/A Wound Margin: Large (67-100%) N/A N/A Granulation Amount: Red N/A N/A Granulation Quality: None Present (0%) N/A N/A Necrotic Amount: Fat Layer (Subcutaneous Tissue): Yes N/A N/A Exposed Structures: Fascia: No Tendon: No Muscle: No Joint: No Bone: No Small (1-33%) N/A N/A Epithelialization: Chemical/Enzymatic/Mechanical N/A N/A Debridement: N/A N/A N/A Instrument: None N/A N/A Bleeding: 0 N/A N/A Procedural Pain: 0 N/A N/A Post Procedural Pain: Debridement Treatment Response: Procedure was tolerated well N/A N/A Post Debridement Measurements L x 1x1.4x0.1 N/A N/A W x D (cm) 0.11 N/A N/A Post Debridement Volume: (cm) Category/Stage III N/A N/A Post Debridement Stage: Debridement N/A N/A Procedures Performed: Treatment Notes Electronic  Signature(s) Signed: 03/08/2021 5:29:10 PM By: Linton Ham MD Signed: 03/08/2021 6:03:12 PM By: Baruch Gouty RN, BSN Entered By: Linton Ham on 03/08/2021 14:21:02 -------------------------------------------------------------------------------- Multi-Disciplinary Care Plan Details Patient Name: Date of Service: [name] I. 03/06/9483 4:62 PM Medical Record Number: [id number] Patient Account Number: [id number] Date of Birth/Sex: Treating RN: [birth year] (56 y.o. [name] Primary Care Cloee Dunwoody: York Ram Other Clinician: Referring Hitomi Slape: Treating Tinleigh Whitmire/Extender: Fonnie Jarvis in Treatment: 0 Multidisciplinary Care Plan reviewed with physician Active Inactive Wound/Skin Impairment Nursing Diagnoses: Impaired tissue integrity Knowledge deficit related to ulceration/compromised skin integrity Goals: Patient/caregiver will verbalize understanding of skin care regimen Date Initiated: 03/08/2021 Target Resolution Date: 04/05/2021 Goal Status: Active Ulcer/skin breakdown will have a volume reduction of 30% by week 4 Date Initiated: 03/08/2021 Target Resolution Date: 04/05/2021 Goal Status: Active Interventions: Assess patient/caregiver ability to obtain necessary supplies Assess patient/caregiver ability to perform ulcer/skin care regimen upon admission and as needed Assess ulceration(s) every visit Provide education on ulcer and skin care Treatment Activities: Skin care regimen initiated : 03/08/2021 Topical wound management initiated : 03/08/2021 Notes: Electronic Signature(s) Signed: 03/08/2021 6:03:12 PM By: Baruch Gouty RN, BSN Entered By: Baruch Gouty on 03/08/2021 14:13:43 -------------------------------------------------------------------------------- Pain Assessment Details Patient Name: Date of Service: [name] I. 02/05/2991 7:16 PM Medical Record Number: [id number] Patient Account Number: [id number] Date of  Birth/Sex: Treating RN: 1965/08/10 (56 y.o. [name] Primary Care Kodi Guerrera: York Ram Other Clinician: Referring Lagretta Loseke: Treating Damico Partin/Extender: Fonnie Jarvis in Treatment: 0 Active Problems Location of Pain Severity and Description of Pain Patient Has Paino No Site Locations Rate the pain. Current Pain Level: 0 Pain Management and Medication Current Pain Management: Electronic Signature(s) Signed: 03/08/2021 6:03:12 PM By: Baruch Gouty RN, BSN Entered By: Baruch Gouty on 03/08/2021 14:06:01 -------------------------------------------------------------------------------- Patient/Caregiver Education Details Patient Name: Date of Service: [name] I. 1/7/5102HENIDPO2:42 PM Medical Record Number: [id number] Patient Account Number: [id number] Date of Birth/Gender: Treating RN: 08/16/1965 (56 y.o. [name] Primary Care Physician: York Ram Other Clinician: Referring Physician: Treating Physician/Extender: Fonnie Jarvis in Treatment: 0 Education Assessment Education Provided To: Patient Education Topics Provided Welcome T [facility]: o Handouts: Welcome T [facility] o Methods: Explain/Verbal, Printed Responses: Reinforcements needed, State content correctly Wound/Skin Impairment: Handouts: Caring for Your Ulcer, Skin Care Do's and Dont's Methods: Explain/Verbal, Printed Responses: Reinforcements needed, State content correctly Electronic Signature(s) Signed: 03/08/2021 6:03:12 PM By: Baruch Gouty RN, BSN Entered By: Baruch Gouty on 03/08/2021 14:14:47 -------------------------------------------------------------------------------- Wound Assessment Details Patient Name: Date of Service: HEA DEN,  A LICIA I. 02/04/5629 4:97 PM Medical Record Number: [id number] Patient Account Number: [id number] Date of Birth/Sex: Treating RN: 04/20/1965 (56 y.o. [name] Primary Care Josemaria Brining: York Ram Other Clinician: Referring Smantha Boakye: Treating Beonka Amesquita/Extender: Ronnie Derby, Janine Limbo in Treatment: 0 Wound Status Wound Number: 1 Primary Pressure Ulcer Etiology: Wound Location: Right Gluteus Wound Open Wounding Event: Bump Status: Date Acquired: 09/28/2020 Comorbid Cataracts, Chronic sinus problems/congestion, Sickle Cell Disease, Weeks Of Treatment: 0 History: Chronic Obstructive Pulmonary Disease (COPD), Sleep Apnea, Clustered Wound: No Congestive Heart Failure, Hypertension, Hepatitis B, Type II Diabetes, Gout, Osteoarthritis, Neuropathy Photos Wound Measurements Length: (cm) 1 Width: (cm) 1.4 Depth: (cm) 0.1 Area: (cm) 1.1 Volume: (cm) 0.11 % Reduction in Area: 0% % Reduction in Volume: 0% Epithelialization: Small (1-33%) Tunneling: No Undermining: No Wound Description Classification: Category/Stage III Wound Margin: Distinct, outline attached Exudate Amount: Medium Exudate Type: Serosanguineous Exudate Color: red, brown Foul Odor After Cleansing: No Slough/Fibrino No Wound Bed Granulation Amount: Large (67-100%) Exposed Structure Granulation Quality: Red Fascia Exposed: No Necrotic Amount: None Present (0%) Fat Layer (Subcutaneous Tissue) Exposed: Yes Tendon Exposed: No Muscle Exposed: No Joint Exposed: No Bone Exposed: No Treatment Notes Wound #1 (Gluteus) Wound Laterality: Right Cleanser Normal Saline Discharge Instruction: Cleanse the wound with Normal Saline prior to applying a clean dressing using gauze sponges, not tissue or cotton balls. Soap and Water Discharge Instruction: May shower and wash wound with dial antibacterial soap and water prior to dressing change. Peri-Wound Care Topical Primary Dressing Promogran Prisma Matrix, 4.34 (sq in) (silver collagen) Discharge Instruction: Moisten collagen with saline or hydrogel Secondary Dressing Bordered Gauze, 4x4  in Discharge Instruction: Apply over primary dressing as directed. Secured With Compression Wrap Compression Stockings Environmental education officer) Signed: 03/08/2021 6:03:12 PM By: Baruch Gouty RN, BSN Entered By: Baruch Gouty on 03/08/2021 14:12:21 -------------------------------------------------------------------------------- Vitals Details Patient Name: Date of Service: [name] I. 5/0/2774 1:28 PM Medical Record Number: [id number] Patient Account Number: [id number] Date of Birth/Sex: Treating RN: 1965/06/15 (56 y.o. [name] Primary Care Varetta Chavers: York Ram Other Clinician: Referring Monique Hefty: Treating Arthelia Callicott/Extender: Ronnie Derby, Janine Limbo in Treatment: 0 Vital Signs Time Taken: 13:32 Temperature (F): 98.3 Height (in): 69 Pulse (bpm): 95 Source: Stated Respiratory Rate (breaths/min): 20 Weight (lbs): 294 Blood Pressure (mmHg): 136/80 Source: Stated Capillary Blood Glucose (mg/dl): 126 Body Mass Index (BMI): 43.4 Reference Range: 80 - 120 mg / dl Notes glucose per pt report this am Electronic Signature(s) Signed: 03/08/2021 6:03:12 PM By: Baruch Gouty RN, BSN Entered By: Baruch Gouty on 03/08/2021 13:34:06

## 2021-03-07 NOTE — Patient Instructions (Signed)
It was good seeing you today.  I am sorry you are still having problems with this wound.  I gave you some hydrocolloid dressings which you can leave on for 5 to 7 days.  I have placed a referral for wound care and someone should be calling to schedule that appointment.  If it starts draining more, gets more painful, gets warm please be reevaluated.  Please let me know if anything.  Have a great day!

## 2021-03-07 NOTE — Progress Notes (Signed)
° ° °  SUBJECTIVE:   CHIEF COMPLAINT / HPI:   Wound recheck Patient presents to our clinic for recheck of her right buttocks wound.  She reports that since being seen last she was keeping it covered with gauze and wearing cotton underwear but the wound has become more painful.  She also noticed drainage..  When I spoke with her on the phone about this I recommended she be evaluated in the emergency department but she had a 4-hour appointment.  Denies any fever or systemic symptoms.  Reports that it has not been getting any bigger but is just not healing.  OBJECTIVE:   BP (!) 144/95    Pulse 92    Ht 5\' 7"  (1.702 m)    Wt 294 lb 6 oz (133.5 kg)    LMP 04/10/2006    SpO2 97%    BMI 46.11 kg/m   General: Pleasant 56 year old female, no acute distress Cardiac: Regular rate Respiratory: Normal work of breathing, speaking in full sentences Derm: Patient has 2 x 3 cm wound on right buttocks which appears slightly deeper from previous evaluation.  No erythema or drainage appreciated.  No warmth around the wound.    ASSESSMENT/PLAN:   Decubitus ulcer Patient continues to have issues with a wound on her right buttocks.  Slightly deeper from previous evaluation.  No signs of infection today.  It is bothersome for the patient.  Provided patient with hydrocolloid dressing and referral has been placed for wound care.  Provided strict ED and return precautions.  Follow-up in 2 to 4 weeks and sooner if needed.     [name], MD [place]

## 2021-03-08 ENCOUNTER — Encounter (HOSPITAL_BASED_OUTPATIENT_CLINIC_OR_DEPARTMENT_OTHER): Payer: 59 | Attending: Internal Medicine | Admitting: Internal Medicine

## 2021-03-08 ENCOUNTER — Ambulatory Visit (INDEPENDENT_AMBULATORY_CARE_PROVIDER_SITE_OTHER): Payer: 59 | Admitting: Orthopaedic Surgery

## 2021-03-08 ENCOUNTER — Encounter: Payer: Self-pay | Admitting: Orthopaedic Surgery

## 2021-03-08 DIAGNOSIS — I130 Hypertensive heart and chronic kidney disease with heart failure and stage 1 through stage 4 chronic kidney disease, or unspecified chronic kidney disease: Secondary | ICD-10-CM | POA: Diagnosis not present

## 2021-03-08 DIAGNOSIS — F17200 Nicotine dependence, unspecified, uncomplicated: Secondary | ICD-10-CM | POA: Diagnosis not present

## 2021-03-08 DIAGNOSIS — N1831 Chronic kidney disease, stage 3a: Secondary | ICD-10-CM | POA: Insufficient documentation

## 2021-03-08 DIAGNOSIS — I5030 Unspecified diastolic (congestive) heart failure: Secondary | ICD-10-CM | POA: Diagnosis not present

## 2021-03-08 DIAGNOSIS — E1140 Type 2 diabetes mellitus with diabetic neuropathy, unspecified: Secondary | ICD-10-CM | POA: Insufficient documentation

## 2021-03-08 DIAGNOSIS — L89313 Pressure ulcer of right buttock, stage 3: Secondary | ICD-10-CM | POA: Diagnosis present

## 2021-03-08 DIAGNOSIS — M7541 Impingement syndrome of right shoulder: Secondary | ICD-10-CM | POA: Diagnosis not present

## 2021-03-08 DIAGNOSIS — J449 Chronic obstructive pulmonary disease, unspecified: Secondary | ICD-10-CM | POA: Diagnosis not present

## 2021-03-08 DIAGNOSIS — E1122 Type 2 diabetes mellitus with diabetic chronic kidney disease: Secondary | ICD-10-CM | POA: Insufficient documentation

## 2021-03-08 DIAGNOSIS — D573 Sickle-cell trait: Secondary | ICD-10-CM | POA: Insufficient documentation

## 2021-03-08 DIAGNOSIS — S83281A Other tear of lateral meniscus, current injury, right knee, initial encounter: Secondary | ICD-10-CM | POA: Insufficient documentation

## 2021-03-08 DIAGNOSIS — Z6841 Body Mass Index (BMI) 40.0 and over, adult: Secondary | ICD-10-CM | POA: Insufficient documentation

## 2021-03-08 DIAGNOSIS — S83241A Other tear of medial meniscus, current injury, right knee, initial encounter: Secondary | ICD-10-CM

## 2021-03-08 DIAGNOSIS — M19011 Primary osteoarthritis, right shoulder: Secondary | ICD-10-CM | POA: Diagnosis not present

## 2021-03-08 DIAGNOSIS — M75111 Incomplete rotator cuff tear or rupture of right shoulder, not specified as traumatic: Secondary | ICD-10-CM | POA: Diagnosis not present

## 2021-03-08 NOTE — Progress Notes (Signed)
MCKYNNA, VANLOAN ([id number]) Visit Report for 03/08/2021 Chief Complaint Document Details Patient Name: Date of Service: Hanley Hays I. 04/30/3293 1:88 PM Medical Record Number: [id number] Patient Account Number: [id number] Date of Birth/Sex: Treating RN: September 11, 1965 (56 y.o. [name] Primary Care Provider: York Ram Other Clinician: Referring Provider: Treating Provider/Extender: Ronnie Derby, Janine Limbo in Treatment: 0 Information Obtained from: Patient Chief Complaint 03/08/2021; patient is here for review of a wound on the right buttock Electronic Signature(s) Signed: 03/08/2021 5:29:10 PM By: Linton Ham MD Entered By: Linton Ham on 03/08/2021 14:21:32 -------------------------------------------------------------------------------- Debridement Details Patient Name: Date of Service: Hanley Hays I. 6/0/1093 2:35 PM Medical Record Number: [id number] Patient Account Number: [id number] Date of Birth/Sex: Treating RN: Nov 15, 1965 (56 y.o. [name] Primary Care Provider: York Ram Other Clinician: Referring Provider: Treating Provider/Extender: Fonnie Jarvis in Treatment: 0 Debridement Performed for Assessment: Wound #1 Right Gluteus Performed By: Clinician Baruch Gouty, RN Debridement Type: Chemical/Enzymatic/Mechanical Agent Used: gauze and wound cleanser Level of Consciousness (Pre-procedure): Awake and Alert Pre-procedure Verification/Time Out No Taken: Bleeding: None Procedural Pain: 0 Post Procedural Pain: 0 Response to Treatment: Procedure was tolerated well Level of Consciousness (Post- Awake and Alert procedure): Post Debridement Measurements of Total Wound Length: (cm) 1 Stage: Category/Stage III Width: (cm) 1.4 Depth: (cm) 0.1 Volume: (cm) 0.11 Character of Wound/Ulcer Post Debridement: Improved Post Procedure Diagnosis Same as Pre-procedure Electronic Signature(s) Signed:  03/08/2021 5:29:10 PM By: Linton Ham MD Signed: 03/08/2021 6:03:12 PM By: Baruch Gouty RN, BSN Entered By: Baruch Gouty on 03/08/2021 14:19:25 -------------------------------------------------------------------------------- HPI Details Patient Name: Date of Service: Hanley Hays I. 03/06/621 7:62 PM Medical Record Number: [id number] Patient Account Number: [id number] Date of Birth/Sex: Treating RN: [birth year] (56 y.o. [name] Primary Care Provider: York Ram Other Clinician: Referring Provider: Treating Provider/Extender: Ronnie Derby, Janine Limbo in Treatment: 0 History of Present Illness HPI Description: ADMISSION 03/08/2021 This is a 56 year old woman who is somewhat immobile secondary to morbid obesity, previous thoracic spinal stenosis status post surgery. She tells me that about 5 to 6 months ago she noted "blackhead" on her right buttock. She was able to pop this but since then she has been left with a wound. She went to see her primary doctor last month diagnosed with a stage II pressure ulcer on her right buttock. Given DuoDERM to use she has been using this faithfully however not making any progress. She followed up with her primary doctor in February and she is referred here. She does not have a wound history. Past medical history includes sleep apnea, thoracic spinal stenosis, right hip osteoarthritis, right rotator cuff tear, type 2 diabetes diet controlled, stage IIIa chronic renal failure, hypertension, morbid obesity however the patient has managed to lose over 100 pounds. She is a smoker but she is saying she is stopping that as well. Diastolic heart failure and gout. Electronic Signature(s) Signed: 03/08/2021 5:29:10 PM By: Linton Ham MD Entered By: Linton Ham on 03/08/2021 14:23:26 -------------------------------------------------------------------------------- Physical Exam Details Patient Name: Date of Service: Hanley Hays I. 0/07/3708 6:26 PM Medical Record Number: [id number] Patient Account Number: [id number] Date of Birth/Sex: Treating RN: 1965/12/26 (56 y.o. [name] Primary Care Provider: York Ram Other Clinician: Referring Provider: Treating Provider/Extender: Ronnie Derby, Janine Limbo in Treatment: 0 Constitutional Sitting or standing Blood Pressure is within target range for patient.. Pulse regular and within target range for patient.[name] Kitchen Respirations regular, non-labored and within target  range.. Temperature is normal and within the target range for the patient.[name] Kitchen Appears in no distress. Integumentary (Hair, Skin) No erythema around the wound. Notes Wound exam; I think the wound is small oval-shaped wound medially and just above the gluteal fold. Surface that this looks healthy some raised edges around the wound but generally I could not see any reason for debridement there is no evidence of infection. No subcutaneous involvement was palpable and no tenderness Electronic Signature(s) Signed: 03/08/2021 5:29:10 PM By: Linton Ham MD Entered By: Linton Ham on 03/08/2021 14:24:49 -------------------------------------------------------------------------------- Physician Orders Details Patient Name: Date of Service: Hanley Hays I. 04/02/5730 2:02 PM Medical Record Number: [id number] Patient Account Number: [id number] Date of Birth/Sex: Treating RN: 1965/06/17 (56 y.o. [name] Primary Care Provider: York Ram Other Clinician: Referring Provider: Treating Provider/Extender: Fonnie Jarvis in Treatment: 0 Verbal / Phone Orders: No Diagnosis Coding Follow-up Appointments ppointment in 2 weeks. - Dr. Dellia Nims Return A Bathing/ Shower/ Hygiene May shower and wash wound with soap and water. Off-Loading Turn and reposition every 2 hours - stand at least hourly during the day while awake Wound Treatment Wound #1 - Gluteus  Wound Laterality: Right Cleanser: Normal Saline (DME) (Generic) 1 x Per Day/30 Days Discharge Instructions: Cleanse the wound with Normal Saline prior to applying a clean dressing using gauze sponges, not tissue or cotton balls. Cleanser: Soap and Water 1 x Per Day/30 Days Discharge Instructions: May shower and wash wound with dial antibacterial soap and water prior to dressing change. Prim Dressing: Promogran Prisma Matrix, 4.34 (sq in) (silver collagen) (DME) (Generic) 1 x Per Day/30 Days ary Discharge Instructions: Moisten collagen with saline or hydrogel Secondary Dressing: Bordered Gauze, 4x4 in (DME) (Generic) 1 x Per Day/30 Days Discharge Instructions: Apply over primary dressing as directed. Electronic Signature(s) Signed: 03/08/2021 5:29:10 PM By: Linton Ham MD Signed: 03/08/2021 6:03:12 PM By: Baruch Gouty RN, BSN Entered By: Baruch Gouty on 03/08/2021 14:28:40 -------------------------------------------------------------------------------- Problem List Details Patient Name: Date of Service: Hanley Hays I. 06/29/8313 1:76 PM Medical Record Number: [id number] Patient Account Number: [id number] Date of Birth/Sex: Treating RN: 1965-06-09 (56 y.o. [name] Primary Care Provider: York Ram Other Clinician: Referring Provider: Treating Provider/Extender: Ronnie Derby, Janine Limbo in Treatment: 0 Active Problems ICD-10 Encounter Code Description Active Date MDM Diagnosis L89.313 Pressure ulcer of right buttock, stage 3 03/08/2021 No Yes E66.01 Morbid (severe) obesity due to excess calories 03/08/2021 No Yes Inactive Problems Resolved Problems Electronic Signature(s) Signed: 03/08/2021 5:29:10 PM By: Linton Ham MD Entered By: Linton Ham on 03/08/2021 14:20:53 -------------------------------------------------------------------------------- Progress Note Details Patient Name: Date of Service: Hanley Hays I. 03/06/9483 4:62  PM Medical Record Number: [id number] Patient Account Number: [id number] Date of Birth/Sex: Treating RN: Apr 08, 1965 (56 y.o. [name] Primary Care Provider: York Ram Other Clinician: Referring Provider: Treating Provider/Extender: Ronnie Derby, Janine Limbo in Treatment: 0 Subjective Chief Complaint Information obtained from Patient 03/08/2021; patient is here for review of a wound on the right buttock History of Present Illness (HPI) ADMISSION 03/08/2021 This is a 56 year old woman who is somewhat immobile secondary to morbid obesity, previous thoracic spinal stenosis status post surgery. She tells me that about 5 to 6 months ago she noted "blackhead" on her right buttock. She was able to pop this but since then she has been left with a wound. She went to see her primary doctor last month diagnosed with a stage II pressure ulcer on her right  buttock. Given DuoDERM to use she has been using this faithfully however not making any progress. She followed up with her primary doctor in February and she is referred here. She does not have a wound history. Past medical history includes sleep apnea, thoracic spinal stenosis, right hip osteoarthritis, right rotator cuff tear, type 2 diabetes diet controlled, stage IIIa chronic renal failure, hypertension, morbid obesity however the patient has managed to lose over 100 pounds. She is a smoker but she is saying she is stopping that as well. Diastolic heart failure and gout. Patient History Information obtained from Patient, Chart. Allergies aspirin (Severity: Moderate, Reaction: Other), methadone HCl (Severity: Moderate, Reaction: intolerance), Corticosteroids (Glucocorticoids) (Severity: Mild, Reaction: other), levofloxacin (Severity: Mild, Reaction: itching) Family History Cancer - Paternal Grandparents,Maternal Grandparents, Hypertension - Father,Mother,Siblings, Stroke - Father, No family history of Diabetes, Heart  Disease, Hereditary Spherocytosis, Kidney Disease, Lung Disease, Seizures, Thyroid Problems, Tuberculosis. Social History Current every day smoker - pt states quitting today, Marital Status - Divorced, Alcohol Use - Never, Drug Use - Prior History - hx crack and TCH, Caffeine Use - Never. Medical History Eyes Patient has history of Cataracts - bil extractions 12/22 Denies history of Glaucoma, Optic Neuritis Ear/Nose/Mouth/Throat Patient has history of Chronic sinus problems/congestion Denies history of Middle ear problems Hematologic/Lymphatic Patient has history of Sickle Cell Disease - trait Respiratory Patient has history of Chronic Obstructive Pulmonary Disease (COPD), Sleep Apnea Cardiovascular Patient has history of Congestive Heart Failure, Hypertension Gastrointestinal Patient has history of Hepatitis B Endocrine Patient has history of Type II Diabetes Denies history of Type I Diabetes Genitourinary Denies history of End Stage Renal Disease Integumentary (Skin) Denies history of History of Burn Musculoskeletal Patient has history of Gout, Osteoarthritis - right hip Neurologic Patient has history of Neuropathy Oncologic Denies history of Received Chemotherapy, Received Radiation Psychiatric Denies history of Anorexia/bulimia, Confinement Anxiety Patient is treated with Controlled Diet, Insulin. Blood sugar is tested. Hospitalization/Surgery History - colonoscopy with polypectomy. - back surgeries x6. - cervical spine surgery. - left shoiulder surgery. - breast cysts removed bil. - bariatric surgery. - left foot surgery. - teeth extractions. Medical A Surgical History Notes nd Constitutional Symptoms (General Health) morbid obesity Respiratory Restrictive lung disease secondary to obesity, pulmonary nodule Cardiovascular Paroxysmal SVT Hyperlipidemia, polycythemia, hypercholestrolemia , Gastrointestinal GERD, polyp of colon Endocrine Diabetic  neuropathy Genitourinary overactive bladder Integumentary (Skin) Skin ulcer of left thigh, limited to breakdown of skin ([place]), decubitis ulcer Musculoskeletal Trigger finger, left index finger Rotator cuff tear, right Nontraumatic complete tear of right rotator cuff Nontraumatic tear of left supraspinatus tendon Osteoarthritis of right hip Impingement syndrome of left shoulder Unilateral primary osteoarthritis, right knee Back pain Neurologic Ulnar nerve compression Cubital tunnel syndrome on left Spinal stenosis of thoracic region with radiculopathy Oncologic hx cervical cancer, cancerous colon polys Psychiatric Depression with anxiety Review of Systems (ROS) Constitutional Symptoms (General Health) Denies complaints or symptoms of Fatigue, Fever, Chills, Marked Weight Change. Eyes Complains or has symptoms of Glasses / Contacts. Denies complaints or symptoms of Dry Eyes, Vision Changes. Ear/Nose/Mouth/Throat Denies complaints or symptoms of Chronic sinus problems or rhinitis. Respiratory Denies complaints or symptoms of Chronic or frequent coughs, Shortness of Breath. Cardiovascular Denies complaints or symptoms of Chest pain. Gastrointestinal Denies complaints or symptoms of Frequent diarrhea, Nausea, Vomiting. Endocrine Denies complaints or symptoms of Heat/cold intolerance. Genitourinary Denies complaints or symptoms of Frequent urination. Integumentary (Skin) Complains or has symptoms of Wounds - right glut. Musculoskeletal Complains or has symptoms of Muscle Weakness. Neurologic  Complains or has symptoms of Numbness/parasthesias - soles of bil feet. Psychiatric Denies complaints or symptoms of Claustrophobia, Suicidal. Objective Constitutional Sitting or standing Blood Pressure is within target range for patient.. Pulse regular and within target range for patient.[name] Kitchen Respirations regular, non-labored and within target range.. Temperature is normal and within the target  range for the patient.[name] Kitchen Appears in no distress. Vitals Time Taken: 1:32 PM, Height: 69 in, Source: Stated, Weight: 294 lbs, Source: Stated, BMI: 43.4, Temperature: 98.3 F, Pulse: 95 bpm, Respiratory Rate: 20 breaths/min, Blood Pressure: 136/80 mmHg, Capillary Blood Glucose: 126 mg/dl. General Notes: glucose per pt report this am General Notes: Wound exam; I think the wound is small oval-shaped wound medially and just above the gluteal fold. Surface that this looks healthy some raised edges around the wound but generally I could not see any reason for debridement there is no evidence of infection. No subcutaneous involvement was palpable and no tenderness Integumentary (Hair, Skin) No erythema around the wound. Wound #1 status is Open. Original cause of wound was Bump. The date acquired was: 09/28/2020. The wound is located on the Right Gluteus. The wound measures 1cm length x 1.4cm width x 0.1cm depth; 1.1cm^2 area and 0.11cm^3 volume. There is Fat Layer (Subcutaneous Tissue) exposed. There is no tunneling or undermining noted. There is a medium amount of serosanguineous drainage noted. The wound margin is distinct with the outline attached to the wound base. There is large (67-100%) red granulation within the wound bed. There is no necrotic tissue within the wound bed. Assessment Active Problems ICD-10 Pressure ulcer of right buttock, stage 3 Morbid (severe) obesity due to excess calories Procedures Wound #1 Pre-procedure diagnosis of Wound #1 is a Pressure Ulcer located on the Right Gluteus . There was a Chemical/Enzymatic/Mechanical debridement performed by Baruch Gouty, RN.. Other agent used was gauze and wound cleanser. There was no bleeding. The procedure was tolerated well with a pain level of 0 throughout and a pain level of 0 following the procedure. Post Debridement Measurements: 1cm length x 1.4cm width x 0.1cm depth; 0.11cm^3 volume. Post debridement Stage noted as  Category/Stage III. Character of Wound/Ulcer Post Debridement is improved. Post procedure Diagnosis Wound #1: Same as Pre-Procedure Plan Follow-up Appointments: Return Appointment in 2 weeks. - Dr. Dellia Nims Bathing/ Shower/ Hygiene: May shower and wash wound with soap and water. Off-Loading: Turn and reposition every 2 hours - stand at least hourly during the day while awake WOUND #1: - Gluteus Wound Laterality: Right Cleanser: Normal Saline (DME) (Generic) Every Other Day/15 Days Discharge Instructions: Cleanse the wound with Normal Saline prior to applying a clean dressing using gauze sponges, not tissue or cotton balls. Cleanser: Soap and Water Every Other Day/15 Days Discharge Instructions: May shower and wash wound with dial antibacterial soap and water prior to dressing change. Prim Dressing: Promogran Prisma Matrix, 4.34 (sq in) (silver collagen) (DME) (Generic) Every Other Day/15 Days ary Discharge Instructions: Moisten collagen with saline or hydrogel Secondary Dressing: Bordered Gauze, 4x4 in (DME) (Generic) Every Other Day/15 Days Discharge Instructions: Apply over primary dressing as directed. 1. I am not sure how this started but the continuing nonhealing status of this is probably purely pressure related. 2. There is no evidence of surrounding infection 3. We are going to use moistened silver collagen border foam. This will not be an easy wound for the patient to do her own dressing. She has apparently a friend who lives nearby who may be able to help her. 4. I saw no  evidence of infection no need for antibiotics 5. She was counseled to keep the pressure off this area both when she is lying and sitting. She expressed understanding Electronic Signature(s) Signed: 03/08/2021 5:29:10 PM By: Linton Ham MD Entered By: Linton Ham on 03/08/2021 14:26:24 -------------------------------------------------------------------------------- HxROS Details Patient Name: Date of  Service: Hanley Hays I. 10/03/930 6:71 PM Medical Record Number: [id number] Patient Account Number: [id number] Date of Birth/Sex: Treating RN: 1965/02/27 (56 y.o. Nancy Fetter Primary Care Provider: York Ram Other Clinician: Referring Provider: Treating Provider/Extender: Fonnie Jarvis in Treatment: 0 Information Obtained From Patient Chart Constitutional Symptoms (General Health) Complaints and Symptoms: Negative for: Fatigue; Fever; Chills; Marked Weight Change Medical History: Past Medical History Notes: morbid obesity Eyes Complaints and Symptoms: Positive for: Glasses / Contacts Negative for: Dry Eyes; Vision Changes Medical History: Positive for: Cataracts - bil extractions 12/22 Negative for: Glaucoma; Optic Neuritis Ear/Nose/Mouth/Throat Complaints and Symptoms: Negative for: Chronic sinus problems or rhinitis Medical History: Positive for: Chronic sinus problems/congestion Negative for: Middle ear problems Respiratory Complaints and Symptoms: Negative for: Chronic or frequent coughs; Shortness of Breath Medical History: Positive for: Chronic Obstructive Pulmonary Disease (COPD); Sleep Apnea Past Medical History Notes: Restrictive lung disease secondary to obesity, pulmonary nodule Cardiovascular Complaints and Symptoms: Negative for: Chest pain Medical History: Positive for: Congestive Heart Failure; Hypertension Past Medical History Notes: Paroxysmal SVT Hyperlipidemia, polycythemia, hypercholestrolemia , Gastrointestinal Complaints and Symptoms: Negative for: Frequent diarrhea; Nausea; Vomiting Medical History: Positive for: Hepatitis B Past Medical History Notes: GERD, polyp of colon Endocrine Complaints and Symptoms: Negative for: Heat/cold intolerance Medical History: Positive for: Type II Diabetes Negative for: Type I Diabetes Past Medical History Notes: Diabetic neuropathy Time with diabetes: since  2028 Treated with: Insulin, Diet Blood sugar tested every day: Yes Tested : 3 x per day Genitourinary Complaints and Symptoms: Negative for: Frequent urination Medical History: Negative for: End Stage Renal Disease Past Medical History Notes: overactive bladder Integumentary (Skin) Complaints and Symptoms: Positive for: Wounds - right glut Medical History: Negative for: History of Burn Past Medical History Notes: Skin ulcer of left thigh, limited to breakdown of skin ([employer]), decubitis ulcer Musculoskeletal Complaints and Symptoms: Positive for: Muscle Weakness Medical History: Positive for: Gout; Osteoarthritis - right hip Past Medical History Notes: Trigger finger, left index finger Rotator cuff tear, right Nontraumatic complete tear of right rotator cuff Nontraumatic tear of left supraspinatus tendon Osteoarthritis of right hip Impingement syndrome of left shoulder Unilateral primary osteoarthritis, right knee Back pain Neurologic Complaints and Symptoms: Positive for: Numbness/parasthesias - soles of bil feet Medical History: Positive for: Neuropathy Past Medical History Notes: Ulnar nerve compression Cubital tunnel syndrome on left Spinal stenosis of thoracic region with radiculopathy Psychiatric Complaints and Symptoms: Negative for: Claustrophobia; Suicidal Medical History: Negative for: Anorexia/bulimia; Confinement Anxiety Past Medical History Notes: Depression with anxiety Hematologic/Lymphatic Medical History: Positive for: Sickle Cell Disease - trait Immunological Oncologic Medical History: Negative for: Received Chemotherapy; Received Radiation Past Medical History Notes: hx cervical cancer, cancerous colon polys HBO Extended History Items Ear/Nose/Mouth/Throat: Eyes: Chronic sinus Cataracts problems/congestion Immunizations Pneumococcal Vaccine: Received Pneumococcal Vaccination: Yes Received Pneumococcal Vaccination On or After 60th  Birthday: No Tetanus Vaccine: Last tetanus shot: 01/09/2021 Implantable Devices No devices added Hospitalization / Surgery History Type of Hospitalization/Surgery colonoscopy with polypectomy back surgeries x6 cervical spine surgery left shoiulder surgery breast cysts removed bil bariatric surgery left foot surgery teeth extractions Family and Social History Cancer: Yes - Paternal Grandparents,Maternal Grandparents; Diabetes: No; Heart Disease:  No; Hereditary Spherocytosis: No; Hypertension: Yes - Father,Mother,Siblings; Kidney Disease: No; Lung Disease: No; Seizures: No; Stroke: Yes - Father; Thyroid Problems: No; Tuberculosis: No; Current every day smoker - pt states quitting today; Marital Status - Divorced; Alcohol Use: Never; Drug Use: Prior History - hx crack and TCH; Caffeine Use: Never; Financial Concerns: No; Food, Clothing or Shelter Needs: No; Support System Lacking: No; Transportation Concerns: No Engineer, maintenance) Signed: 03/08/2021 5:29:10 PM By: Linton Ham MD Signed: 03/08/2021 6:03:12 PM By: Baruch Gouty RN, BSN Signed: 03/08/2021 6:05:40 PM By: Levan Hurst RN, BSN Entered By: Baruch Gouty on 03/08/2021 13:59:49 -------------------------------------------------------------------------------- [place] Details Patient Name: Date of Service: Hanley Hays I. 03/07/3660 Medical Record Number: [id number] Patient Account Number: [id number] Date of Birth/Sex: Treating RN: [birth year] (56 y.o. [name] Primary Care Provider: York Ram Other Clinician: Referring Provider: Treating Provider/Extender: Ronnie Derby, Janine Limbo in Treatment: 0 Diagnosis Coding ICD-10 Codes Code Description 6472372326 Pressure ulcer of right buttock, stage 3 E66.01 Morbid (severe) obesity due to excess calories Facility Procedures CPT4 Code: [id number] Description: [place] VISIT-LEV 3 EST PT Modifier: 25 Quantity: 1 CPT4 Code:  [id number] Description: 67591 - DEBRIDE W/O ANES NON SELECT Modifier: Quantity: 1 Physician Procedures : CPT4 Code Description Modifier [id number] [place] PHYS LEVEL 3 NEW PT ICD-10 Diagnosis Description L89.313 Pressure ulcer of right buttock, stage 3 E66.01 Morbid (severe) obesity due to excess calories Quantity: 1 Electronic Signature(s) Signed: 03/08/2021 5:29:10 PM By: Linton Ham MD Signed: 03/08/2021 6:03:12 PM By: Baruch Gouty RN, BSN Entered By: Baruch Gouty on 03/08/2021 14:45:07

## 2021-03-08 NOTE — Progress Notes (Signed)
Office Visit Note   Patient: [name]           Date of Birth: Nov 16, 1965           MRN: [id number] Visit Date: 03/08/2021              Requested by: [name], MD [address],  [place] [id number] PCP: [name], MD   Assessment & Plan: Visit Diagnoses:  1. Nontraumatic incomplete tear of right rotator cuff   2. Impingement syndrome of right shoulder   3. Arthritis of right acromioclavicular joint   4. Acute medial meniscus tear of right knee, initial encounter   5. Acute lateral meniscus tear of right knee, initial encounter     Plan: Besse returns today to discuss MRI of the right knee and right shoulder.  Examination of the right shoulder shows pain with testing of impingement and rotator cuff with weakness.  Pain with cross body adduction test.  Tenderness to the Circles Of Care joint.  Range of motion is well preserved. Examination of the right knee shows lateral and medial joint line tenderness.  Small joint effusion.  Patellofemoral crepitus with range of motion.  The MRI of the right shoulder shows progression of the previous small full-thickness supraspinatus tear with 2 and half centimeters of retraction.  I do not see any muscle atrophy.  There is a severe biceps tendinosis and moderate AC joint arthritis.  The MRI of the right knee shows radial tear of the posterior horn near the root attachment.  I see a horizontal signal within the lateral meniscus which is consistent with a tear as well.  Given these findings she has elected to move forward with a right knee arthroscopy and partial medial and lateral meniscectomy and chondroplasty as indicated.  She is having more symptoms from the knee.  She is at risk for falling.  We will address the shoulder once the right knee has been taken care of.  We will call the patient in the near future.  Risk benefits rehab recovery prognosis reviewed with the patient.  Follow-Up Instructions: No follow-ups on file.    Orders:  No orders of the defined types were placed in this encounter.  No orders of the defined types were placed in this encounter.     Procedures: No procedures performed   Clinical Data: No additional findings.   Subjective: Chief Complaint  Patient presents with   Right Shoulder - Pain, Follow-up   Right Knee - Pain, Follow-up    HPI  Review of Systems   Objective: Vital Signs: LMP 04/10/2006   Physical Exam  Ortho Exam  Specialty Comments:  No specialty comments available.  Imaging: No results found.   PMFS History: Patient Active Problem List   Diagnosis Date Noted   Impingement syndrome of right shoulder 03/08/2021   Arthritis of right acromioclavicular joint 03/08/2021   Acute medial meniscus tear of right knee 03/08/2021   Acute lateral meniscus tear of right knee 03/08/2021   Spinal stenosis of thoracic region with radiculopathy    Bilateral leg numbness    Spinal stenosis of lumbar region with neurogenic claudication    Unilateral primary osteoarthritis, right knee    Impaired ambulation 01/10/2021   Status post lumbar laminectomy 10/03/2020   Cubital tunnel syndrome on left 05/02/2020   Vaginal discharge 01/20/2020   Prolapse of anterior vaginal wall 11/10/2019   Skin ulcer of left thigh, limited to breakdown of skin ([place]) 09/09/2019   Ulnar  nerve compression 08/06/2019   Diabetic neuropathy associated with type 2 diabetes mellitus ([place]) 08/06/2019   Body mass index 50.0-59.9, adult ([place]) 06/12/2019   Impingement syndrome of left shoulder 05/07/2019   Polyp of colon    Polyp of ascending colon    Rectal discomfort 02/12/2019   Osteoarthritis of right hip 02/12/2019   Nontraumatic tear of left supraspinatus tendon 12/30/2018   Gout 12/17/2018   Nontraumatic complete tear of right rotator cuff 12/08/2018   Rotator cuff tear, right 12/01/2018   Tobacco abuse 08/21/2018   Right groin pain 07/20/2018   Depressed mood 07/13/2018    Paroxysmal SVT (supraventricular tachycardia) ([place]) 03/20/2018   Pure hypercholesterolemia 03/20/2018   Muscle cramping 02/25/2018   Nodule of upper lobe of right lung 02/13/2018   Falls, subsequent encounter 01/30/2018   Pain in left foot 11/04/2017   Essential hypertension 10/27/2017   Solitary pulmonary nodule 10/07/2017   Restrictive lung disease secondary to obesity 10/01/2017   Polycythemia, secondary 10/01/2017   Chronic viral hepatitis B without delta-agent ([place]) 07/08/2017   COPD (chronic obstructive pulmonary disease) with chronic bronchitis ([place]) 06/27/2017   Chronic kidney disease (CKD), stage IV (severe) ([place]) 01/14/2017   Decubitus ulcer 12/13/2016   Type 2 diabetes mellitus with stage 3 chronic kidney disease, with long-term current use of insulin ([place]) 12/12/2016   Urge incontinence of urine 12/05/2016   Trigger finger, left index finger 07/15/2016   Back pain 04/07/2014   Morbid obesity ([place]) 10/02/2012   Chronic diastolic CHF (congestive heart failure) ([place]) 04/07/2012   GERD 01/26/2010   Obstructive sleep apnea treated with BiPAP 02/03/2008   FIBROCYSTIC BREAST DISEASE 10/28/2006   Hyperlipidemia 03/27/2006   Obesity hypoventilation syndrome ([place]) 03/27/2006   Depression with anxiety 03/27/2006   Past Medical History:  Diagnosis Date   Alcoholism ([place])    Anxiety    Arthritis 04-10-11   hips, shoulders, back   Asthma    Bipolar disorder ([place])    Cardiomegaly    Carpal tunnel syndrome, bilateral    Cervical cancer ([place]) 1993   cervical, no treatment done, went away per pt   Cervical dysplasia or atypia 04-10-11   '93- once dx.-got pregnant-no intervention, then postpartum, no dysplasia found   CHF (congestive heart failure) ([place])    no cardiologist 2014 dx, none now   Chronic hypoxemic respiratory failure ([place])    Chronic kidney disease    stage 3 kidney disease   Condyloma - gluteal cleft 04/09/2011   Removed by general surgery. Pathology showed  Condyloma, gluteal CONDYLOMA ACUMINATUM.    COPD (chronic obstructive pulmonary disease) ([place])    Depression    Diabetes mellitus without complication ([place])    Dyspnea    with actity, sitting   Fall 12/16/2016   Fibrocystic breast disease    GERD (gastroesophageal reflux disease)    Grade I diastolic dysfunction 65/78/4696   Noted on ECHO   Hepatitis    hep B-count is low at present,doesn't register   History of PSVT (paroxysmal supraventricular tachycardia)    Hypercalcemia    Hyperlipidemia    Hypertension    Incomplete left bundle branch block (LBBB) 09/24/2018   Noted on EKG   Lung nodule 11/2017   right and left lung   Migraine headache    none recent   Morbid obesity ([place]) 03/27/2006   Neuropathy    Pneumonia    walking pneu 15 yrs. ago   Skin lesion 03/15/2011   In gluteal crease now s/p removal  by Dr. [name] of General Surgery on 3/12. Path shows condyloma.      Sleep apnea 04-10-11   uses cpap, pt does not know settings   Trigger finger    left third   Urge incontinence of urine     Family History  Problem Relation Age of Onset   Pulmonary embolism Mother    Stroke Father    Alcohol abuse Father    Pneumonia Father    Hypertension Father    Breast cancer Maternal Aunt    Hypertension Other    Diabetes Other        Aunts and cousins   Asthma Sister    Depression Sister    Arthritis Sister    Sickle cell anemia Son     Past Surgical History:  Procedure Laterality Date   ANAL FISTULECTOMY  04/17/2011   Procedure: FISTULECTOMY ANAL;  Surgeon: Imogene Burn. [name], MD;  Location: [facility];  Service: General;  Laterality: N/A;  Excision of Condyloma Gluteal Cleft    ANAL RECTAL MANOMETRY N/A 03/26/2019   Procedure: ANO RECTAL MANOMETRY;  Surgeon: Thornton Park, MD;  [facility];  Service: Gastroenterology;  Laterality: N/A;   BACK SURGERY  2009   pt thinks it was an "interbody fusion"   BIOPSY  03/09/2019   Procedure: BIOPSY;  Surgeon: Thornton Park, MD;  Location: [facility];  Service: Gastroenterology;;   BOTOX INJECTION N/A 12/29/2018   Procedure: BOTOX INJECTION(100 UNITS)  WITH CYSTOSCOPY;  Surgeon: Ceasar Mons, MD;  [facility];  Service: Urology;  Laterality: N/A;   BOTOX INJECTION N/A 07/27/2019   Procedure: BOTOX INJECTION WITH CYSTOSCOPY;  Surgeon: Ceasar Mons, MD;  [facility];  Service: Urology;  Laterality: N/A;  ONLY NEEDS 30 MIN   BREAST CYST EXCISION     bilateral breast, 3 cysts removed from each breast   BREAST EXCISIONAL BIOPSY Right    x 3   BREAST EXCISIONAL BIOPSY Left    x 3   CARPAL TUNNEL RELEASE Bilateral    Cervical biospy     CERVICAL FUSION  04/10/2011   x3- cervical fusion with plating and screws-Dr. [name]   COLONOSCOPY WITH PROPOFOL N/A 07/06/2015   Procedure: COLONOSCOPY WITH PROPOFOL;  Surgeon: [name], MD;  [facility];  Service: Endoscopy;  Laterality: N/A;   COLONOSCOPY WITH PROPOFOL N/A 03/09/2019   Procedure: COLONOSCOPY WITH PROPOFOL;  Surgeon: Thornton Park, MD;  [facility];  Service: Gastroenterology;  Laterality: N/A;   DOPPLER ECHOCARDIOGRAPHY  04/06/2012   AT [place] 55-60%   FOOT SURGERY Left 2018   FRACTURE SURGERY     little toe left foot   I & D EXTREMITY Left 12/18/2016   Procedure: IRRIGATION AND DEBRIDEMENT LEFT FOOT, CLOSURE;  Surgeon: [name], MD;  Location: [facility];  Service: Orthopedics;  Laterality: Left;   INJECTION KNEE Right 10/03/2020   Procedure: KNEE INJECTION;  Surgeon: Jessy Oto, MD;  Location: [facility];  Service: Orthopedics;  Laterality: Right;   LAPAROSCOPIC GASTRIC SLEEVE RESECTION N/A 05/08/2020   Procedure: LAPAROSCOPIC GASTRIC SLEEVE RESECTION;  Surgeon: Kieth Brightly Arta Bruce, MD;  [facility];  Service: General;  Laterality: N/A;   [place] SURGERY  04/10/2011   x5-Lumbar fusion-retained hardware.(Dr. Louanne Skye)   LUMBAR LAMINECTOMY N/A 10/03/2020   Procedure:  THORACOLUMBAR LAMINECTOMIES THORACIC ELEVEN-TWELVE, THORACIC TWELVE-LUMBAR ONE  AND LUMBAT ONE-TWO;  Surgeon: Jessy Oto, MD;  Location: [facility];  Service: Orthopedics;  Laterality: N/A;  MULTIPLE TOOTH EXTRACTIONS     POLYPECTOMY  03/09/2019   Procedure: POLYPECTOMY;  Surgeon: Thornton Park, MD;  [facility];  Service: Gastroenterology;;   SHOULDER ARTHROSCOPY WITH ROTATOR CUFF REPAIR AND SUBACROMIAL DECOMPRESSION Left 05/07/2019   Procedure: LEFT SHOULDER ARTHROSCOPY WITH DEBRIDEMENT, DISTAL CLAVICLE EXCISION,  SUBACROMIAL DECOMPRESSION AND POSSIBLE ROTATOR CUFF REPAIR;  Surgeon: [name], MD;  Location: [facility];  Service: Orthopedics;  Laterality: Left;   TEE WITHOUT CARDIOVERSION N/A 04/06/2012   Procedure: TRANSESOPHAGEAL ECHOCARDIOGRAM (TEE);  Surgeon: [name], MD;  Location: [facility];  Service: Cardiovascular;  Laterality: N/A;   TRIGGER FINGER RELEASE Right    middle finger   TRIGGER FINGER RELEASE Left 06/28/2016   Procedure: RELEASE TRIGGER FINGER LEFT 3RD FINGER;  Surgeon: [name], MD;  Location: [facility];  Service: Orthopedics;  Laterality: Left;   TRIGGER FINGER RELEASE Right 06/12/2016   Procedure: RIGHT INDEX FINGER TRIGGER RELEASE;  Surgeon: [name], MD;  Location: [facility];  Service: Orthopedics;  Laterality: Right;   TRIGGER FINGER RELEASE Right 01/19/2018   Procedure: RIGHT RING FINGER TRIGGER FINGER RELEASE;  Surgeon: [name], MD;  Location: [facility];  Service: Orthopedics;  Laterality: Right;   TRIGGER FINGER RELEASE Left 05/07/2019   Procedure: RELEASE TRIGGER FINGER LEFT INDEX FINGER;  Surgeon: [name], MD;  Location: [facility];  Service: Orthopedics;  Laterality: Left;   UPPER GI ENDOSCOPY N/A 05/08/2020   Procedure: UPPER GI ENDOSCOPY;  Surgeon: Kieth Brightly, Arta Bruce, MD;  [facility];  Service: General;  Laterality: N/A;   Social History   Occupational History   Occupation: disabled    Comment: Disabled  Tobacco Use    Smoking status: Some Days    Packs/day: 0.20    Years: 40.00    Pack years: 8.00    Types: Cigarettes    Start date: 01/28/1977   Smokeless tobacco: Never   Tobacco comments:    Smoked 2 ciggs on 07/17/2020 has not smoked since  Vaping Use   Vaping Use: Never used  Substance and Sexual Activity   Alcohol use: No    Alcohol/week: 0.0 standard drinks   Drug use: Not Currently    Types: Marijuana, "Crack" cocaine    Comment: hx marijuana usemany years ago, Crack -none since 11/2015   Sexual activity: Not Currently    Partners: Male    Birth control/protection: Post-menopausal

## 2021-03-08 NOTE — Assessment & Plan Note (Signed)
Patient continues to have issues with a wound on her right buttocks.  Slightly deeper from previous evaluation.  No signs of infection today.  It is bothersome for the patient.  Provided patient with hydrocolloid dressing and referral has been placed for wound care.  Provided strict ED and return precautions.  Follow-up in 2 to 4 weeks and sooner if needed.

## 2021-03-08 NOTE — Progress Notes (Signed)
SHANTINA, CHRONISTER ([id number]) Visit Report for 03/08/2021 Abuse Risk Screen Details Patient Name: Date of Service: [name]. 08/05/4694 2:95 PM Medical Record Number: [id number] Patient Account Number: [id number] Date of Birth/Sex: Treating RN: [birth year] (56 y.o. [name] Primary Care Jordy Hewins: York Ram Other Clinician: Referring Marchello Rothgeb: Treating Carolynne Schuchard/Extender: Ronnie Derby, Janine Limbo in Treatment: 0 Abuse Risk Screen Items Answer ABUSE RISK SCREEN: Has anyone close to you tried to hurt or harm you recentlyo No Do you feel uncomfortable with anyone in your familyo No Has anyone forced you do things that you didnt want to doo No Electronic Signature(s) Signed: 03/08/2021 6:03:12 PM By: Baruch Gouty RN, BSN Entered By: Baruch Gouty on 03/08/2021 13:48:24 -------------------------------------------------------------------------------- Activities of Daily Living Details Patient Name: Date of Service: [name]. 1/0/2725 3:66 PM Medical Record Number: [id number] Patient Account Number: [id number] Date of Birth/Sex: Treating RN: October 30, 1965 (56 y.o. [name] Primary Care Jahdiel Krol: York Ram Other Clinician: Referring Lillyian Heidt: Treating Marcus Groll/Extender: Ronnie Derby, Janine Limbo in Treatment: 0 Activities of Daily Living Items Answer Activities of Daily Living (Please select one for each item) Drive Automobile Completely Able T Medications ake Completely Able Use T elephone Completely Able Care for Appearance Completely Able Use T oilet Completely Able Bath / Shower Completely Able Dress Self Completely Able Feed Self Completely Able Walk Completely Able Get In / Out Bed Completely Able Housework Completely Able Prepare Meals Completely Able Handle Money Completely Able Shop for Self Completely Able Electronic Signature(s) Signed: 03/08/2021 6:03:12 PM By: Baruch Gouty RN, BSN Entered By:  Baruch Gouty on 03/08/2021 13:48:45 -------------------------------------------------------------------------------- Education Screening Details Patient Name: Date of Service: [name]. 10/02/6385 5:64 PM Medical Record Number: [id number] Patient Account Number: [id number] Date of Birth/Sex: Treating RN: [birth year] (56 y.o. [name] Primary Care Holly Iannaccone: York Ram Other Clinician: Referring Shelonda Saxe: Treating Tifany Hirsch/Extender: Fonnie Jarvis in Treatment: 0 Primary Learner Assessed: Patient Learning Preferences/Education Level/Primary Language Learning Preference: Explanation, Demonstration, Printed Material Highest Education Level: High School Preferred Language: English Cognitive Barrier Language Barrier: No Translator Needed: No Memory Deficit: No Emotional Barrier: No Cultural/Religious Beliefs Affecting Medical Care: No Physical Barrier Impaired Vision: No Impaired Hearing: No Decreased Hand dexterity: No Knowledge/Comprehension Knowledge Level: High Comprehension Level: High Ability to understand written instructions: High Ability to understand verbal instructions: High Motivation Anxiety Level: Calm Cooperation: Cooperative Education Importance: Acknowledges Need Interest in Health Problems: Asks Questions Perception: Coherent Willingness to Engage in Self-Management High Activities: Readiness to Engage in Self-Management High Activities: Electronic Signature(s) Signed: 03/08/2021 6:03:12 PM By: Baruch Gouty RN, BSN Entered By: Baruch Gouty on 03/08/2021 13:49:35 -------------------------------------------------------------------------------- Fall Risk Assessment Details Patient Name: Date of Service: [name]. 02/02/6061 0:16 PM Medical Record Number: [id number] Patient Account Number: [id number] Date of Birth/Sex: Treating RN: 1965-03-17 (56 y.o. [name] Primary Care Tehilla Coffel:  York Ram Other Clinician: Referring Yenifer Saccente: Treating Reiner Loewen/Extender: Ronnie Derby, Janine Limbo in Treatment: 0 Fall Risk Assessment Items Have you had 2 or more falls in the last 12 monthso 0 No Have you had any fall that resulted in injury in the last 12 monthso 0 No FALLS RISK SCREEN History of falling - immediate or within 3 months 0 No Secondary diagnosis (Do you have 2 or more medical diagnoseso) 0 No Ambulatory aid None/bed rest/wheelchair/nurse 0 No Crutches/cane/walker 15 Yes Furniture 0 No Intravenous therapy Access/Saline/Heparin Lock 0 No Gait/Transferring Normal/ bed rest/ wheelchair 0 Yes  Weak (short steps with or without shuffle, stooped but able to lift head while walking, may seek 0 No support from furniture) Impaired (short steps with shuffle, may have difficulty arising from chair, head down, impaired 0 No balance) Mental Status Oriented to own ability 0 Yes Electronic Signature(s) Signed: 03/08/2021 6:03:12 PM By: Baruch Gouty RN, BSN Entered By: Baruch Gouty on 03/08/2021 13:50:03 -------------------------------------------------------------------------------- Foot Assessment Details Patient Name: Date of Service: [name]. 08/04/2421 5:36 PM Medical Record Number: [id number] Patient Account Number: [id number] Date of Birth/Sex: Treating RN: Feb 19, 1965 (56 y.o. [name] Primary Care Hermelinda Diegel: York Ram Other Clinician: Referring Belladonna Lubinski: Treating Akeisha Lagerquist/Extender: Ronnie Derby, Janine Limbo in Treatment: 0 Foot Assessment Items Site Locations + = Sensation present, - = Sensation absent, C = Callus, U = Ulcer R = Redness, W = Warmth, M = Maceration, PU = Pre-ulcerative lesion F = Fissure, S = Swelling, D = Dryness Assessment Right: Left: Other Deformity: No No Prior Foot Ulcer: No No Prior Amputation: No No Charcot Joint: No No Ambulatory Status: Ambulatory With Help Assistance  Device: Walker Gait: Steady Electronic Signature(s) Signed: 03/08/2021 6:03:12 PM By: Baruch Gouty RN, BSN Entered By: Baruch Gouty on 03/08/2021 13:50:38 -------------------------------------------------------------------------------- Nutrition Risk Screening Details Patient Name: Date of Service: [name]. 09/02/7617 5:09 PM Medical Record Number: [id number] Patient Account Number: [id number] Date of Birth/Sex: Treating RN: 13-May-1965 (56 y.o. [name] Primary Care Ethan Kasperski: York Ram Other Clinician: Referring Colleen Kotlarz: Treating Deyci Gesell/Extender: Ronnie Derby, Janine Limbo in Treatment: 0 Height (in): 69 Weight (lbs): 294 Body Mass Index (BMI): 43.4 Nutrition Risk Screening Items Score Screening NUTRITION RISK SCREEN: Osborne have an illness or condition that made me change the kind and/or amount of food Osborne eat 0 No Osborne eat fewer than two meals per day 0 No Osborne eat few fruits and vegetables, or milk products 0 No Osborne have three or more drinks of beer, liquor or wine almost every day 0 No Osborne have tooth or mouth problems that make it hard for me to eat 0 No Osborne don't always have enough money to buy the food Osborne need 0 No Osborne eat alone most of the time 1 Yes Osborne take three or more different prescribed or over-the-counter drugs a day 1 Yes Without wanting to, Osborne have lost or gained 10 pounds in the last six months 0 No Osborne am not always physically able to shop, cook and/or feed myself 0 No Nutrition Protocols Good Risk Protocol 0 No interventions needed Moderate Risk Protocol High Risk Proctocol Risk Level: Good Risk Score: 2 Electronic Signature(s) Signed: 03/08/2021 6:03:12 PM By: Baruch Gouty RN, BSN Entered By: Baruch Gouty on 03/08/2021 13:50:27

## 2021-03-09 ENCOUNTER — Encounter: Payer: Self-pay | Admitting: Podiatry

## 2021-03-09 ENCOUNTER — Other Ambulatory Visit: Payer: Self-pay

## 2021-03-09 ENCOUNTER — Ambulatory Visit (HOSPITAL_BASED_OUTPATIENT_CLINIC_OR_DEPARTMENT_OTHER): Admit: 2021-03-09 | Payer: 59 | Admitting: Urology

## 2021-03-09 ENCOUNTER — Telehealth: Payer: Self-pay | Admitting: Pharmacist

## 2021-03-09 ENCOUNTER — Ambulatory Visit (INDEPENDENT_AMBULATORY_CARE_PROVIDER_SITE_OTHER): Payer: 59 | Admitting: Podiatry

## 2021-03-09 DIAGNOSIS — B351 Tinea unguium: Secondary | ICD-10-CM | POA: Diagnosis not present

## 2021-03-09 DIAGNOSIS — M79674 Pain in right toe(s): Secondary | ICD-10-CM

## 2021-03-09 DIAGNOSIS — E1142 Type 2 diabetes mellitus with diabetic polyneuropathy: Secondary | ICD-10-CM

## 2021-03-09 DIAGNOSIS — M79675 Pain in left toe(s): Secondary | ICD-10-CM

## 2021-03-09 DIAGNOSIS — N184 Chronic kidney disease, stage 4 (severe): Secondary | ICD-10-CM | POA: Diagnosis not present

## 2021-03-09 DIAGNOSIS — Z720 Tobacco use: Secondary | ICD-10-CM

## 2021-03-09 SURGERY — BOTOX INJECTION
Anesthesia: General

## 2021-03-09 NOTE — Assessment & Plan Note (Signed)
Patient contacted for follow/up of tobacco intake reduction / tobacco cessation attempt.   Since last contact patient reports reducing intake to 5 cigarettes per day! She plans to completely quit next Wednesday 03/14/2021.  She has knee surgery planned in the near future.  Medications currently being used;  Nicotine patch - 21mg  Patient denies any significant side effects from tobacco cessation therapy.   Rates IMPORTANCE of quitting tobacco remains high. Rates CONFIDENCE of quitting tobacco was also high.   Most common triggers to use tobacco include; stress.   Motivation to quit: surgery and wound healing after surgery.    Total time with patient call and documentation of interaction: 13 minutes.  F/U Phone call planned: 1 week (1-2 days after planned quit date).

## 2021-03-09 NOTE — Progress Notes (Signed)
This patient returns to my office for at risk foot care.  This patient requires this care by a professional since this patient will be at risk due to having chronic kidney disease and type 2 diabetes.  This patient is unable to cut nails herself since the patient cannot reach her nails.These nails are painful walking and wearing shoes.  This patient presents for at risk foot care today.  General Appearance  Alert, conversant and in no acute stress.  Vascular  Dorsalis pedis and posterior tibial  pulses are palpable  bilaterally.  Capillary return is within normal limits  bilaterally. Temperature is within normal limits  bilaterally.  Neurologic  Senn-Weinstein monofilament wire test diminished  bilaterally. Muscle power within normal limits bilaterally.  Nails Thick disfigured discolored nails with subungual debris  from hallux to fifth toes bilaterally. No evidence of bacterial infection or drainage bilaterally.  Orthopedic  No limitations of motion  feet .  No crepitus or effusions noted.  No bony pathology or digital deformities noted.  HAV  Left foot.  Skin  normotropic skin with no porokeratosis noted bilaterally.  No signs of infections or ulcers noted.     Onychomycosis  Pain in right toes  Pain in left toes  Consent was obtained for treatment procedures.   Mechanical debridement of nails 1-5  bilaterally performed with a nail nipper.  Filed with dremel without incident.    Return office visit   3 months                 Told patient to return for periodic foot care and evaluation due to potential at risk complications.   Mikhael Hendriks DPM   

## 2021-03-09 NOTE — Telephone Encounter (Signed)
Patient contacted for follow/up of tobacco intake reduction / tobacco cessation attempt.   Since last contact patient reports reducing intake to 5 cigarettes per day! She plans to completely quit next Wednesday 03/14/2021.  She has knee surgery planned in the near future.  Medications currently being used;  Nicotine patch - 21mg  Patient denies any significant side effects from tobacco cessation therapy.   Rates IMPORTANCE of quitting tobacco remains high. Rates CONFIDENCE of quitting tobacco was also high.   Most common triggers to use tobacco include; stress.   Motivation to quit: surgery and wound healing after surgery.    Total time with patient call and documentation of interaction: 13 minutes.  F/U Phone call planned: 1 week (1-2 days after planned quit date).

## 2021-03-09 NOTE — Telephone Encounter (Signed)
-----   Message from Leavy Cella, [place] sent at 02/21/2021 11:28 AM EST ----- Regarding: Nicotine Patches 21mg  start

## 2021-03-09 NOTE — Telephone Encounter (Signed)
Noted and agree. 

## 2021-03-12 ENCOUNTER — Encounter: Payer: Self-pay | Admitting: *Deleted

## 2021-03-12 ENCOUNTER — Telehealth: Payer: Self-pay | Admitting: *Deleted

## 2021-03-12 NOTE — Telephone Encounter (Signed)
No recent data on [employer].  I called the patient to see if she was still using her machine.  She states she stopped using it because she lost 123 pounds and "does not need it anymore".  States she now weighs 294 lb. She states it was messing with her breathing and she quit using it.  I can see some data for a few days in November 2022.  I asked her if she would like to discuss this further including the possibility of doing a repeat sleep study if deemed appropriate.  Patient was very interested in this.  Will discuss with [facility] NP tomorrow.

## 2021-03-13 ENCOUNTER — Ambulatory Visit: Payer: 59 | Admitting: Family Medicine

## 2021-03-13 ENCOUNTER — Ambulatory Visit: Payer: 59 | Admitting: Skilled Nursing Facility1

## 2021-03-13 ENCOUNTER — Telehealth (INDEPENDENT_AMBULATORY_CARE_PROVIDER_SITE_OTHER): Payer: 59 | Admitting: Adult Health

## 2021-03-13 ENCOUNTER — Encounter: Payer: 59 | Attending: General Surgery | Admitting: Skilled Nursing Facility1

## 2021-03-13 DIAGNOSIS — N1831 Chronic kidney disease, stage 3a: Secondary | ICD-10-CM | POA: Diagnosis present

## 2021-03-13 DIAGNOSIS — Z794 Long term (current) use of insulin: Secondary | ICD-10-CM | POA: Insufficient documentation

## 2021-03-13 DIAGNOSIS — N184 Chronic kidney disease, stage 4 (severe): Secondary | ICD-10-CM | POA: Diagnosis present

## 2021-03-13 DIAGNOSIS — E1122 Type 2 diabetes mellitus with diabetic chronic kidney disease: Secondary | ICD-10-CM | POA: Diagnosis present

## 2021-03-13 DIAGNOSIS — G4733 Obstructive sleep apnea (adult) (pediatric): Secondary | ICD-10-CM

## 2021-03-13 NOTE — Telephone Encounter (Signed)
Update: pt had her appt with [facility] NP. A staff member spoke with pt on the phone.

## 2021-03-13 NOTE — Progress Notes (Signed)
Bariatric Nutrition Follow-Up Visit Medical Nutrition Therapy   Surgery Date: 05/08/2020  Pts appt conducted via the telephone pt agreed to this appt visit type; pt identified by name and DOB  NUTRITION ASSESSMENT    Anthropometrics  Start weight at [facility]: 405.6 lbs (date: 07/08/2018) Today's weight: virtual appt: pt identified by name and DOB, pt agreeable to limitations fo this visit type  Body Composition Scale 07/03/2020  Weight  lbs 329.8  Total Body Fat  % 49.6     Visceral Fat 22  Fat-Free Mass  % 50.3     Total Body Water  % 39.6     Muscle-Mass  lbs 35  BMI 48.5  Body Fat Displacement ---        Torso  lbs 101.6        Left Leg  lbs 20.3        Right Leg  lbs 20.3        Left Arm  lbs 10.1        Right Arm  lbs 10.1   Clinical  Medical hx: HTN, Sleep apnea, GERD, DM Medications: Humalog only in the morning Labs: Lipid panel WNL   Lifestyle & Dietary Hx  Pt states her blood sugars have been good: 86-146. Pt states she has been having a friend move in with her to take care of her stating he does not cook like she does.  Pt does not complain of any hypoglycemia symptoms or dehydration symptoms.   Pt states she will be getting knee and rotator cuff surgery soon. Pt states her goal is to be 250 pounds so she can get her hip surgery and also remove the extra skin so she does not get an infection; stating she is currently 294 pounds.  Pt states she is in a lot of pain due to the surgeries she needs stating she also needs neck surgery.  Pt states she has had an ulcer on her butt that she has been going to would care for stating the edge is starting to scab.  Pt states her appetite has been good but is vexed but not being able to eat the amount of food she actually wants to eat stating physically she is full but mentally wants to eat more.   Estimated daily fluid intake: 64 oz Estimated daily protein intake: 60 g Supplements: calcium and multi Current average weekly  physical activity: ADLs due to back and other pain   24-Hr Dietary Recall: mixed greens 2-3 times a week, sometimes purple cabbage, broccoli and cheese often First Meal: salad: cucumber, banana, pineapple, iceburg + [place] dressing and ranch Snack: protein water Second Meal: other half of salad  Snack:  Third Meal: chicken wings or cucumber and lettuce + [place] meatloaf or the third of her breakfast salad Snack: protein shake to take medicine  Beverages: water, protein water, sugar free twist, almond milk  Post-Op Goals/ Signs/ Symptoms Using straws: no Drinking while eating: no Chewing/swallowing difficulties: no Changes in vision: no Changes to mood/headaches: no Hair loss/changes to skin/nails: no Difficulty focusing/concentrating: no Sweating: no Dizziness/lightheadedness: no Palpitations: no  Carbonated/caffeinated beverages: no N/V/D/C/Gas: yes constipation but states it is getting better Abdominal pain: no Dumping syndrome: no    NUTRITION DIAGNOSIS  Overweight/obesity (-3.3) related to past poor dietary habits and physical inactivity as evidenced by completed bariatric surgery and following dietary guidelines for continued weight loss and healthy nutrition status.     NUTRITION INTERVENTION Nutrition counseling (C-1) and education (E-2)  to facilitate bariatric surgery goals, including: The importance of consuming adequate calories as well as certain nutrients daily due to the body's need for essential vitamins, minerals, and fats The importance of daily physical activity and to reach a goal of at least 150 minutes of moderate to vigorous physical activity weekly (or as directed by their physician) due to benefits such as increased musculature and improved lab values The importance of intuitive eating specifically learning hunger-satiety cues and understanding the importance of learning a new body: The importance of mindful eating to avoid grazing behaviors  Encouraged  patient to honor their body's internal hunger and fullness cues.  Throughout the day, check in mentally and rate hunger. Stop eating when satisfied not full regardless of how much food is left on the plate.  Get more if still hungry 20-30 minutes later.  The key is to honor satisfaction so throughout the meal, rate fullness factor and stop when comfortably satisfied not physically full. The key is to honor hunger and fullness without any feelings of guilt or shame.  Pay attention to what the internal cues are, rather than any external factors. This will enhance the confidence you have in listening to your own body and following those internal cues enabling you to increase how often you eat when you are hungry not out of appetite and stop when you are satisfied not full.  Encouraged pt to continue to eat balanced meals inclusive of non starchy vegetables 2 times a day 7 days a week Encouraged pt to choose lean protein sources: limiting beef, pork, sausage, hotdogs, and lunch meat Encourage pt to choose healthy fats such as plant based limiting animal fats Encouraged pt to continue to drink a minium 64 fluid ounces with half being plain water to satisfy proper hydration   Goals: -add a boiled egg to your salad -be sure to have protein with every meal every day  Handouts Provided Include  Phase 7  Learning Style & Readiness for Change Teaching method utilized: Visual & Auditory  Demonstrated degree of understanding via: Teach Back  Readiness Level: Action Barriers to learning/adherence to lifestyle change: back pain  RD's Notes for Next Visit Assess adherence to pt chosen goals   MONITORING & EVALUATION Dietary intake, weekly physical activity, body weight  Next Steps Patient is to follow-up: pt will call back when she knows when her knee surgery is to hopefully come in person

## 2021-03-13 NOTE — Progress Notes (Signed)
PATIENT: [name] DOB: 2/95/1886  REASON FOR VISIT: follow up HISTORY FROM: patient PRIMARY NEUROLOGIST:   Virtual Visit via Video Note  I connected with [name] on 16/60/63 at  2:45 PM EST by a video enabled telemedicine application located remotely at [facility] and verified that I am speaking with the correct person using two identifiers who was located at their own home.   I discussed the limitations of evaluation and management by telemedicine and the availability of in person appointments. The patient expressed understanding and agreed to proceed.   PATIENT: [name] DOB: [birth year]  REASON FOR VISIT: follow up HISTORY FROM: patient  HISTORY OF PRESENT ILLNESS: Today 03/13/21 [name] is a 56 year old female with a history of obstructive sleep apnea on BiPAP.  She returns today for follow-up.  She reports that April last year she had gastric sleeve surgery. has lost about 123lbs. No longer using BiPAP.  She is currently weighing 294 pounds her goal is to get to 250 pounds.  She is interested in repeating a home sleep test to see the status of her apnea.  REVIEW OF SYSTEMS: Out of a complete 14 system review of symptoms, the patient complains only of the following symptoms, and all other reviewed systems are negative.  ALLERGIES: Allergies  Allergen Reactions   Aspirin Other (See Comments)    Kidney disease   Methadone Hcl Other (See Comments)     "blacked out" in 1990's   Corticosteroids Other (See Comments)    Hyperglycemia    Levofloxacin Itching    HOME MEDICATIONS: Outpatient Medications Prior to Visit  Medication Sig Dispense Refill   ACCU-CHEK AVIVA PLUS test strip 1 EACH BY OTHER ROUTE 3 (THREE) TIMES DAILY. TEST 3 TIMES DAILY 300 strip 3   Accu-Chek Softclix Lancets lancets USE TO TEST 4 TIMES A DAY 200 each 6   acetaminophen (TYLENOL) 500 MG tablet Take 1,000 mg by mouth 2 (two) times daily.     albuterol  (VENTOLIN HFA) 108 (90 Base) MCG/ACT inhaler Inhale 2 puffs into the lungs every 6 (six) hours as needed for wheezing or shortness of breath. 18 g 3   allopurinol (ZYLOPRIM) 300 MG tablet Take 300 mg by mouth in the morning.     atorvastatin (LIPITOR) 40 MG tablet Take 1 tablet (40 mg total) by mouth daily. 90 tablet 3   b complex vitamins capsule Take 1 capsule by mouth daily.     B-D UF III MINI PEN NEEDLES 31G X 5 MM MISC CHECK SUGARS 4 TIMES A DAY BEFORE MEALS AND AT BEDTIME. 100 each 12   baclofen (LIORESAL) 10 MG tablet Take 1 tablet (10 mg total) by mouth 3 (three) times daily as needed for muscle spasms. (Patient taking differently: Take 10 mg by mouth 3 (three) times daily.) 90 each 0   blood glucose meter kit and supplies KIT Dispense based on patient and insurance preference. Use up to four times daily as directed. (FOR ICD-9 250.00, 250.01). 1 each 0   busPIRone (BUSPAR) 10 MG tablet TAKE 1 TABLET BY MOUTH THREE TIMES A DAY 270 tablet 4   calcium carbonate (OSCAL) 1500 (600 Ca) MG TABS tablet Take 1,500 mg by mouth 3 (three) times daily with meals.     diclofenac Sodium (VOLTAREN) 1 % GEL APPLY 2 GRAMS TO AFFECTED AREA 4 TIMES A DAY 500 g 1   diltiazem (CARDIZEM CD) 120 MG 24 hr capsule TAKE 1 CAPSULE (  120 MG TOTAL) BY MOUTH 2 (TWO) TIMES DAILY. 180 capsule 2   docusate sodium (COLACE) 100 MG capsule Take 200 mg by mouth 2 (two) times daily.     fluticasone (FLONASE) 50 MCG/ACT nasal spray PLACE 2 SPRAYS INTO BOTH NOSTRILS TWICE DAILY 48 mL 33   Fluticasone-Umeclidin-Vilant (TRELEGY ELLIPTA) 100-62.5-25 MCG/INH AEPB Inhale 1 puff into the lungs daily. 180 each 3   gabapentin (NEURONTIN) 300 MG capsule Take 2 capsules (600 mg total) by mouth 3 (three) times daily. TAKE 2 CAPSULES (600 MG TOTAL) BY MOUTH in the morning and at lunch and take 3 capsules at night 540 capsule 2   hydrOXYzine (ATARAX/VISTARIL) 10 MG tablet Take 1 tablet (10 mg total) by mouth daily. 90 tablet 3   insulin lispro  (HUMALOG KWIKPEN) 100 UNIT/ML KwikPen Inject 3-4 Units into the skin 3 (three) times daily. 15 mL 3   ketoconazole (NIZORAL) 2 % cream APPLY 1 FINGERTIP AMOUNT TO EACH FOOT DAILY. (Patient taking differently: Apply 1 application topically in the morning, at noon, and at bedtime.) 30 g 2   ketorolac (ACULAR) 0.5 % ophthalmic solution Place 1 drop into both eyes in the morning, at noon, and at bedtime.     loratadine (CLARITIN) 10 MG tablet Take 1 tablet (10 mg total) by mouth daily. 30 tablet 11   losartan (COZAAR) 100 MG tablet Take 1 tablet (100 mg total) by mouth daily. 90 tablet 3   methocarbamol (ROBAXIN) 500 MG tablet TAKE 1 TABLET BY MOUTH EVERY 6 HOURS AS NEEDED FOR MUSCLE SPASMS. 40 tablet 1   metolazone (ZAROXOLYN) 2.5 MG tablet TAKE 1 TABLET EVERY OTHER DAY. TAKE 1 TABLET TWICE WEEKLY ON MONDAYS AND THURSDAYS ONLY 45 tablet 7   montelukast (SINGULAIR) 10 MG tablet TAKE 1 TABLET BY MOUTH EVERYDAY AT BEDTIME 90 tablet 3   Multiple Vitamins-Minerals (BARIATRIC MULTIVITAMINS/IRON) CAPS Take 1 capsule by mouth daily.     NARCAN 4 MG/0.1ML LIQD nasal spray kit Place 1 spray into the nose as needed (accidental overdose).     nicotine (NICOTINE STEP 1) 21 mg/24hr patch Place 1 patch (21 mg total) onto the skin daily. 28 patch 2   NON FORMULARY Uses a C-PAP at bedtime     nystatin (MYCOSTATIN) 100000 UNIT/ML suspension TAKE 5 MLS (500,000 UNITS TOTAL) BY MOUTH 4 (FOUR) TIMES DAILY. SWISH AND SWALLOW. 60 mL 3   ofloxacin (OCUFLOX) 0.3 % ophthalmic solution Place 1 drop into both eyes in the morning, at noon, and at bedtime.     omeprazole (PRILOSEC) 20 MG capsule TAKE 1 CAPSULE BY MOUTH EVERY DAY 90 capsule 3   oxyCODONE (OXY IR/ROXICODONE) 5 MG immediate release tablet Take 1-3 tablets (5-15 mg total) by mouth every 4 (four) hours as needed for severe pain. 30 tablet 0   Potassium Chloride ER 20 MEQ TBCR Take 20 mEq by mouth daily.     prednisoLONE acetate (PRED FORTE) 1 % ophthalmic suspension  Place 1 drop into both eyes in the morning, at noon, and at bedtime.     varenicline (CHANTIX) 1 MG tablet Take 1 tablet (1 mg total) by mouth 2 (two) times daily with a meal. Take 1 daily for first week then twice daily 60 tablet 3   venlafaxine XR (EFFEXOR-XR) 75 MG 24 hr capsule TAKE 1 CAPSULE BY MOUTH DAILY WITH BREAKFAST. 90 capsule 2   Facility-Administered Medications Prior to Visit  Medication Dose Route Frequency Provider Last Rate Last Admin   technetium tetrofosmin (TC-MYOVIEW) injection  81.1 millicurie  91.4 millicurie Intravenous Once PRN Sueanne Margarita, MD        PAST MEDICAL HISTORY: Past Medical History:  Diagnosis Date   Alcoholism ([place])    Anxiety    Arthritis 04-10-11   hips, shoulders, back   Asthma    Bipolar disorder ([place])    Cardiomegaly    Carpal tunnel syndrome, bilateral    Cervical cancer ([place]) 1993   cervical, no treatment done, went away per pt   Cervical dysplasia or atypia 04-10-11   '93- once dx.-got pregnant-no intervention, then postpartum, no dysplasia found   CHF (congestive heart failure) ([place])    no cardiologist 2014 dx, none now   Chronic hypoxemic respiratory failure ([facility])    Chronic kidney disease    stage 3 kidney disease   Condyloma - gluteal cleft 04/09/2011   Removed by general surgery. Pathology showed Condyloma, gluteal CONDYLOMA ACUMINATUM.    COPD (chronic obstructive pulmonary disease) ([place])    Depression    Diabetes mellitus without complication ([place])    Dyspnea    with actity, sitting   Fall 12/16/2016   Fibrocystic breast disease    GERD (gastroesophageal reflux disease)    Grade I diastolic dysfunction 78/29/5621   Noted on ECHO   Hepatitis    hep B-count is low at present,doesn't register   History of PSVT (paroxysmal supraventricular tachycardia)    Hypercalcemia    Hyperlipidemia    Hypertension    Incomplete left bundle branch block (LBBB) 09/24/2018   Noted on EKG   Lung nodule 11/2017   right and left lung    Migraine headache    none recent   Morbid obesity ([place]) 03/27/2006   Neuropathy    Pneumonia    walking pneu 15 yrs. ago   Skin lesion 03/15/2011   In gluteal crease now s/p removal by Dr. [name] of General Surgery on 3/12. Path shows condyloma.      Sleep apnea 04-10-11   uses cpap, pt does not know settings   Trigger finger    left third   Urge incontinence of urine     PAST SURGICAL HISTORY: Past Surgical History:  Procedure Laterality Date   ANAL FISTULECTOMY  04/17/2011   Procedure: FISTULECTOMY ANAL;  Surgeon: Imogene Burn. [name], MD;  Location: [facility];  Service: General;  Laterality: N/A;  Excision of Condyloma Gluteal Cleft    ANAL RECTAL MANOMETRY N/A 03/26/2019   Procedure: ANO RECTAL MANOMETRY;  Surgeon: Thornton Park, MD;  [facility];  Service: Gastroenterology;  Laterality: N/A;   BACK SURGERY  2009   pt thinks it was an "interbody fusion"   BIOPSY  03/09/2019   Procedure: BIOPSY;  Surgeon: Thornton Park, MD;  Location: [facility];  Service: Gastroenterology;;   BOTOX INJECTION N/A 12/29/2018   Procedure: BOTOX INJECTION(100 UNITS)  WITH CYSTOSCOPY;  Surgeon: Ceasar Mons, MD;  [facility];  Service: Urology;  Laterality: N/A;   BOTOX INJECTION N/A 07/27/2019   Procedure: BOTOX INJECTION WITH CYSTOSCOPY;  Surgeon: Ceasar Mons, MD;  [facility];  Service: Urology;  Laterality: N/A;  ONLY NEEDS 30 MIN   BREAST CYST EXCISION     bilateral breast, 3 cysts removed from each breast   BREAST EXCISIONAL BIOPSY Right    x 3   BREAST EXCISIONAL BIOPSY Left    x 3   CARPAL TUNNEL RELEASE Bilateral    Cervical biospy     CERVICAL FUSION  04/10/2011  x3- cervical fusion with plating and screws-Dr. [name]   COLONOSCOPY WITH PROPOFOL N/A 07/06/2015   Procedure: COLONOSCOPY WITH PROPOFOL;  Surgeon: [name], MD;  [facility];  Service: Endoscopy;  Laterality: N/A;   COLONOSCOPY WITH PROPOFOL N/A 03/09/2019    Procedure: COLONOSCOPY WITH PROPOFOL;  Surgeon: Thornton Park, MD;  [facility];  Service: Gastroenterology;  Laterality: N/A;   DOPPLER ECHOCARDIOGRAPHY  04/06/2012   AT [place] 55-60%   FOOT SURGERY Left 2018   FRACTURE SURGERY     little toe left foot   I & D EXTREMITY Left 12/18/2016   Procedure: IRRIGATION AND DEBRIDEMENT LEFT FOOT, CLOSURE;  Surgeon: [name], MD;  Location: [place];  Service: Orthopedics;  Laterality: Left;   INJECTION KNEE Right 10/03/2020   Procedure: KNEE INJECTION;  Surgeon: Jessy Oto, MD;  Location: [place];  Service: Orthopedics;  Laterality: Right;   LAPAROSCOPIC GASTRIC SLEEVE RESECTION N/A 05/08/2020   Procedure: LAPAROSCOPIC GASTRIC SLEEVE RESECTION;  Surgeon: Kieth Brightly Arta Bruce, MD;  [facility];  Service: General;  Laterality: N/A;   [place] SURGERY  04/10/2011   x5-Lumbar fusion-retained hardware.(Dr. Louanne Skye)   LUMBAR LAMINECTOMY N/A 10/03/2020   Procedure: THORACOLUMBAR LAMINECTOMIES THORACIC ELEVEN-TWELVE, THORACIC TWELVE-LUMBAR ONE  AND LUMBAT ONE-TWO;  Surgeon: Jessy Oto, MD;  Location: [place];  Service: Orthopedics;  Laterality: N/A;   MULTIPLE TOOTH EXTRACTIONS     POLYPECTOMY  03/09/2019   Procedure: POLYPECTOMY;  Surgeon: Thornton Park, MD;  [facility];  Service: Gastroenterology;;   SHOULDER ARTHROSCOPY WITH ROTATOR CUFF REPAIR AND SUBACROMIAL DECOMPRESSION Left 05/07/2019   Procedure: LEFT SHOULDER ARTHROSCOPY WITH DEBRIDEMENT, DISTAL CLAVICLE EXCISION,  SUBACROMIAL DECOMPRESSION AND POSSIBLE ROTATOR CUFF REPAIR;  Surgeon: [name], MD;  Location: [place];  Service: Orthopedics;  Laterality: Left;   TEE WITHOUT CARDIOVERSION N/A 04/06/2012   Procedure: TRANSESOPHAGEAL ECHOCARDIOGRAM (TEE);  Surgeon: [name], MD;  [facility];  Service: Cardiovascular;  Laterality: N/A;   TRIGGER FINGER RELEASE Right    middle finger   TRIGGER FINGER RELEASE Left 06/28/2016    Procedure: RELEASE TRIGGER FINGER LEFT 3RD FINGER;  Surgeon: [name], MD;  Location: [place];  Service: Orthopedics;  Laterality: Left;   TRIGGER FINGER RELEASE Right 06/12/2016   Procedure: RIGHT INDEX FINGER TRIGGER RELEASE;  Surgeon: [name], MD;  Location: [place];  Service: Orthopedics;  Laterality: Right;   TRIGGER FINGER RELEASE Right 01/19/2018   Procedure: RIGHT RING FINGER TRIGGER FINGER RELEASE;  Surgeon: [name], MD;  Location: [place];  Service: Orthopedics;  Laterality: Right;   TRIGGER FINGER RELEASE Left 05/07/2019   Procedure: RELEASE TRIGGER FINGER LEFT INDEX FINGER;  Surgeon: [name], MD;  Location: [place];  Service: Orthopedics;  Laterality: Left;   UPPER GI ENDOSCOPY N/A 05/08/2020   Procedure: UPPER GI ENDOSCOPY;  Surgeon: Kieth Brightly, Arta Bruce, MD;  [facility];  Service: General;  Laterality: N/A;    FAMILY HISTORY: Family History  Problem Relation Age of Onset   Pulmonary embolism Mother    Stroke Father    Alcohol abuse Father    Pneumonia Father    Hypertension Father    Breast cancer Maternal Aunt    Hypertension Other    Diabetes Other        Aunts and cousins   Asthma Sister    Depression Sister    Arthritis Sister    Sickle cell anemia Son  SOCIAL HISTORY: Social History   Socioeconomic History   Marital status: Divorced    Spouse name: Not on file   Number of children: 1   Years of education: 12   Highest education level: 12th grade  Occupational History   Occupation: disabled    Comment: Disabled  Tobacco Use   Smoking status: Some Days    Packs/day: 0.20    Years: 40.00    Pack years: 8.00    Types: Cigarettes    Start date: 01/28/1977   Smokeless tobacco: Never   Tobacco comments:    Smoked 2 ciggs on 07/17/2020 has not smoked since  Vaping Use   Vaping Use: Never used  Substance and Sexual Activity   Alcohol use: No    Alcohol/week: 0.0 standard drinks   Drug use: Not Currently    Types: Marijuana,  "Crack" cocaine    Comment: hx marijuana usemany years ago, Crack -none since 11/2015   Sexual activity: Not Currently    Partners: Male    Birth control/protection: Post-menopausal  Other Topics Concern   Not on file  Social History Narrative   Current Social History       Who lives at home: Patient lives alone in one level home; has 4 steps onto porch with a handrail. Has smoke alarms, no throw rugs. Has elevated toilet. Has shower bench in tub with grab rails.    Transportation: Patient has own vehicle and drives herself    Important Relationships "Family and friends"    Pets: None    Likes to eat varied diet. Seafood, fish, roasted [place] breast, vegetables and salads and fruits.   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 I'm a lovable person."                                                                                                   Social Determinants of Radio broadcast assistant Strain: Low Risk    Difficulty of Paying Living Expenses: Not hard at all  Food Insecurity: No Food Insecurity   Worried About Charity fundraiser in the Last Year: Never true   Arboriculturist in the Last Year: Never true  Transportation Needs: No Transportation Needs   Lack of Transportation (Medical): No   Lack of Transportation (Non-Medical): No  Physical Activity: Inactive   Days of Exercise per Week: 0 days   Minutes of Exercise per Session: 0 min  Stress: No Stress Concern Present   Feeling of Stress : Not at all  Social Connections: Moderately Isolated   Frequency of Communication with Friends and Family: More than three times a week   Frequency of Social Gatherings with Friends and Family: More than three times a week  Attends Religious Services: More than 4 times per year   Active Member of Clubs or Organizations: No   Attends Archivist Meetings: Never   Marital Status: Divorced  Human resources officer Violence: Not on file      PHYSICAL EXAM Generalized: Well developed, in no acute distress   Neurological examination  Mentation: Alert oriented to time, place, history taking. Follows all commands speech and language fluent Cranial nerve II-XII:Extraocular movements were full. Facial symmetry noted. Head turning and shoulder shrug  were normal and symmetric Reflexes: UTA  DIAGNOSTIC DATA (LABS, IMAGING, TESTING) - I reviewed patient records, labs, notes, testing and imaging myself where available.  Lab Results  Component Value Date   WBC 12.3 (H) 10/07/2020   HGB 13.3 10/07/2020   HCT 38.7 10/07/2020   MCV 82.3 10/07/2020   PLT 256 10/07/2020      Component Value Date/Time   NA 138 10/07/2020 0131   NA 139 01/20/2020 1221   K 4.1 10/07/2020 0131   CL 100 10/07/2020 0131   CO2 30 10/07/2020 0131   GLUCOSE 128 (H) 10/07/2020 0131   BUN 16 10/07/2020 0131   BUN 34 (H) 01/20/2020 1221   CREATININE 0.89 10/07/2020 0131   CREATININE 1.54 (H) 11/30/2015 1112   CALCIUM 9.3 10/07/2020 0131   PROT 6.8 09/25/2020 0915   PROT 7.1 02/23/2018 1446   ALBUMIN 3.8 09/25/2020 0915   ALBUMIN 4.3 02/23/2018 1446   AST 20 09/25/2020 0915   ALT 30 09/25/2020 0915   ALKPHOS 81 09/25/2020 0915   BILITOT 0.8 09/25/2020 0915   BILITOT 0.2 02/23/2018 1446   GFRNONAA >60 10/07/2020 0131   GFRNONAA 39 (L) 11/30/2015 1112   GFRAA 46 (L) 01/20/2020 1221   GFRAA 44 (L) 11/30/2015 1112   Lab Results  Component Value Date   CHOL 142 06/28/2020   HDL 46 06/28/2020   LDLCALC 73 06/28/2020   LDLDIRECT 114 (H) 09/15/2012   TRIG 130 06/28/2020   CHOLHDL 3.1 06/28/2020   Lab Results  Component Value Date   HGBA1C 6.7 01/09/2021   No results found for: [id number] Lab Results  Component Value Date    TSH 0.469 03/06/2018      ASSESSMENT AND PLAN 56 y.o. year old female  has a past medical history of Alcoholism ([place]), Anxiety, Arthritis (04-10-11), Asthma, Bipolar disorder ([place]), Cardiomegaly, Carpal tunnel syndrome, bilateral, Cervical cancer ([place]) (1993), Cervical dysplasia or atypia (04-10-11), CHF (congestive heart failure) ([place]), Chronic hypoxemic respiratory failure ([place]), Chronic kidney disease, Condyloma - gluteal cleft (04/09/2011), COPD (chronic obstructive pulmonary disease) ([place]), Depression, Diabetes mellitus without complication ([place]), Dyspnea, Fall (12/16/2016), Fibrocystic breast disease, GERD (gastroesophageal reflux disease), Grade I diastolic dysfunction (94/32/7614), Hepatitis, History of PSVT (paroxysmal supraventricular tachycardia), Hypercalcemia, Hyperlipidemia, Hypertension, Incomplete left bundle branch block (LBBB) (09/24/2018), Lung nodule (11/2017), Migraine headache, Morbid obesity ([place]) (03/27/2006), Neuropathy, Pneumonia, Skin lesion (03/15/2011), Sleep apnea (04-10-11), Trigger finger, and Urge incontinence of urine. here with:  OSA   Currently not using the BiPAP due to 123 pound weight loss We will repeat home sleep test Follow-up pending sleep test    Ward Givens, MSN, NP-C 03/13/2021, 3:04 PM [facility] [address], [place], [place] [address] [phone]

## 2021-03-14 ENCOUNTER — Ambulatory Visit (HOSPITAL_BASED_OUTPATIENT_CLINIC_OR_DEPARTMENT_OTHER): Payer: Self-pay | Admitting: Physical Therapy

## 2021-03-16 ENCOUNTER — Ambulatory Visit (HOSPITAL_BASED_OUTPATIENT_CLINIC_OR_DEPARTMENT_OTHER): Payer: Self-pay | Admitting: Physical Therapy

## 2021-03-19 ENCOUNTER — Encounter: Payer: Self-pay | Admitting: Orthopaedic Surgery

## 2021-03-19 ENCOUNTER — Other Ambulatory Visit: Payer: Self-pay | Admitting: *Deleted

## 2021-03-19 ENCOUNTER — Other Ambulatory Visit: Payer: Self-pay | Admitting: Family Medicine

## 2021-03-19 ENCOUNTER — Other Ambulatory Visit: Payer: Self-pay | Admitting: Podiatry

## 2021-03-19 ENCOUNTER — Telehealth: Payer: Self-pay | Admitting: Pharmacist

## 2021-03-19 DIAGNOSIS — B353 Tinea pedis: Secondary | ICD-10-CM

## 2021-03-19 DIAGNOSIS — E1151 Type 2 diabetes mellitus with diabetic peripheral angiopathy without gangrene: Secondary | ICD-10-CM

## 2021-03-19 NOTE — Telephone Encounter (Signed)
-----   Message from Leavy Cella, [place] sent at 03/01/2021  2:56 PM EST ----- Regarding: Tobacco Intake reduction  - quit smoking?

## 2021-03-19 NOTE — Telephone Encounter (Signed)
Patient contacted for follow/up of tobacco use and potential for intake reduction attempt.  Since last contact patient reports she continues to use the nicotine patch and due to pain  Medications currently being used; Nicotine patch - 14mg  Patient denies any significant side effects from tobacco cessation therapy.   Most common triggers to use tobacco include; PAIN. Patient remains motivated to quit prior to surgery when it is finally scheduled.   Total time with patient call and documentation of interaction: 13 minutes.  F/U Phone call planned: Patient will contact me when surgery date is planned to discuss detailed quit plan.

## 2021-03-20 ENCOUNTER — Other Ambulatory Visit: Payer: Self-pay

## 2021-03-20 ENCOUNTER — Encounter (HOSPITAL_BASED_OUTPATIENT_CLINIC_OR_DEPARTMENT_OTHER): Payer: Self-pay | Admitting: Orthopaedic Surgery

## 2021-03-20 ENCOUNTER — Telehealth: Payer: Self-pay

## 2021-03-20 ENCOUNTER — Encounter (HOSPITAL_BASED_OUTPATIENT_CLINIC_OR_DEPARTMENT_OTHER)
Admission: RE | Admit: 2021-03-20 | Discharge: 2021-03-20 | Disposition: A | Discharge status: Home / Self Care | Payer: 59 | Source: Ambulatory Visit | Attending: Orthopaedic Surgery | Admitting: Orthopaedic Surgery

## 2021-03-20 DIAGNOSIS — I10 Essential (primary) hypertension: Secondary | ICD-10-CM | POA: Insufficient documentation

## 2021-03-20 DIAGNOSIS — Z01812 Encounter for preprocedural laboratory examination: Secondary | ICD-10-CM | POA: Insufficient documentation

## 2021-03-20 LAB — BASIC METABOLIC PANEL
Anion gap: 9 (ref 5–15)
BUN: 14 mg/dL (ref 6–20)
CO2: 27 mmol/L (ref 22–32)
Calcium: 9.2 mg/dL (ref 8.9–10.3)
Chloride: 103 mmol/L (ref 98–111)
Creatinine, Ser: 1.06 mg/dL — ABNORMAL HIGH (ref 0.44–1.00)
GFR, Estimated: >60 mL/min (ref >60)
Glucose, Bld: 83 mg/dL (ref 70–99)
Potassium: 4.4 mmol/L (ref 3.5–5.1)
Sodium: 139 mmol/L (ref 135–145)

## 2021-03-20 NOTE — Progress Notes (Signed)
Enhanced Recovery after Surgery  Enhanced Recovery after Surgery is a protocol used to improve the stress on your body and your recovery after surgery.  Patient Instructions  The night before surgery:  No food after midnight. ONLY clear liquids after midnight  The day of surgery (if you do NOT have diabetes):  Drink ONE (1) Pre-Surgery Clear Ensure as directed.   This drink was given to you during your hospital  pre-op appointment visit. The pre-op nurse will instruct you on the time to drink the  Pre-Surgery Ensure depending on your surgery time. Finish the drink at the designated time by the pre-op nurse.  Nothing else to drink after completing the  Pre-Surgery Clear Ensure.  The day of surgery (if you have diabetes): Drink ONE (1) Gatorade 2 (G2) as directed. This drink was given to you during your hospital  pre-op appointment visit.  The pre-op nurse will instruct you on the time to drink the   Gatorade 2 (G2) depending on your surgery time. Color of the Gatorade may vary. Red is not allowed. Nothing else to drink after completing the  Gatorade 2 (G2).         If office.you have questions, please contact your surgeon's office  

## 2021-03-20 NOTE — Telephone Encounter (Signed)
Spoke with pt about scheduling her sleep study but pt states she is having knee surgery tomorrow and will call me back to schedule when she has recovered.

## 2021-03-21 ENCOUNTER — Encounter (HOSPITAL_BASED_OUTPATIENT_CLINIC_OR_DEPARTMENT_OTHER)
Admission: RE | Disposition: A | Discharge status: Home / Self Care | Payer: Self-pay | Source: Home / Self Care | Attending: Orthopaedic Surgery

## 2021-03-21 ENCOUNTER — Encounter (HOSPITAL_BASED_OUTPATIENT_CLINIC_OR_DEPARTMENT_OTHER): Payer: Self-pay | Admitting: Orthopaedic Surgery

## 2021-03-21 ENCOUNTER — Ambulatory Visit (HOSPITAL_BASED_OUTPATIENT_CLINIC_OR_DEPARTMENT_OTHER): Payer: 59 | Admitting: Certified Registered"

## 2021-03-21 ENCOUNTER — Ambulatory Visit (HOSPITAL_BASED_OUTPATIENT_CLINIC_OR_DEPARTMENT_OTHER)
Admission: RE | Admit: 2021-03-21 | Discharge: 2021-03-21 | Disposition: A | Discharge status: Home / Self Care | Payer: 59 | Attending: Orthopaedic Surgery | Admitting: Orthopaedic Surgery

## 2021-03-21 ENCOUNTER — Encounter: Payer: Self-pay | Admitting: Orthopaedic Surgery

## 2021-03-21 ENCOUNTER — Other Ambulatory Visit: Payer: Self-pay

## 2021-03-21 ENCOUNTER — Ambulatory Visit (HOSPITAL_BASED_OUTPATIENT_CLINIC_OR_DEPARTMENT_OTHER): Payer: Self-pay | Admitting: Physical Therapy

## 2021-03-21 ENCOUNTER — Ambulatory Visit: Payer: 59

## 2021-03-21 DIAGNOSIS — S83271A Complex tear of lateral meniscus, current injury, right knee, initial encounter: Secondary | ICD-10-CM | POA: Diagnosis not present

## 2021-03-21 DIAGNOSIS — K219 Gastro-esophageal reflux disease without esophagitis: Secondary | ICD-10-CM | POA: Insufficient documentation

## 2021-03-21 DIAGNOSIS — M94261 Chondromalacia, right knee: Secondary | ICD-10-CM | POA: Diagnosis not present

## 2021-03-21 DIAGNOSIS — S83241A Other tear of medial meniscus, current injury, right knee, initial encounter: Secondary | ICD-10-CM | POA: Diagnosis present

## 2021-03-21 DIAGNOSIS — F319 Bipolar disorder, unspecified: Secondary | ICD-10-CM | POA: Insufficient documentation

## 2021-03-21 DIAGNOSIS — N189 Chronic kidney disease, unspecified: Secondary | ICD-10-CM | POA: Insufficient documentation

## 2021-03-21 DIAGNOSIS — N183 Chronic kidney disease, stage 3 unspecified: Secondary | ICD-10-CM | POA: Insufficient documentation

## 2021-03-21 DIAGNOSIS — Z6841 Body Mass Index (BMI) 40.0 and over, adult: Secondary | ICD-10-CM | POA: Insufficient documentation

## 2021-03-21 DIAGNOSIS — I130 Hypertensive heart and chronic kidney disease with heart failure and stage 1 through stage 4 chronic kidney disease, or unspecified chronic kidney disease: Secondary | ICD-10-CM | POA: Insufficient documentation

## 2021-03-21 DIAGNOSIS — F17210 Nicotine dependence, cigarettes, uncomplicated: Secondary | ICD-10-CM | POA: Insufficient documentation

## 2021-03-21 DIAGNOSIS — E1122 Type 2 diabetes mellitus with diabetic chronic kidney disease: Secondary | ICD-10-CM | POA: Insufficient documentation

## 2021-03-21 DIAGNOSIS — S83281A Other tear of lateral meniscus, current injury, right knee, initial encounter: Secondary | ICD-10-CM

## 2021-03-21 DIAGNOSIS — X58XXXA Exposure to other specified factors, initial encounter: Secondary | ICD-10-CM | POA: Diagnosis not present

## 2021-03-21 DIAGNOSIS — G4730 Sleep apnea, unspecified: Secondary | ICD-10-CM | POA: Diagnosis not present

## 2021-03-21 DIAGNOSIS — M659 Synovitis and tenosynovitis, unspecified: Secondary | ICD-10-CM

## 2021-03-21 DIAGNOSIS — M1990 Unspecified osteoarthritis, unspecified site: Secondary | ICD-10-CM | POA: Diagnosis not present

## 2021-03-21 DIAGNOSIS — F419 Anxiety disorder, unspecified: Secondary | ICD-10-CM | POA: Diagnosis not present

## 2021-03-21 DIAGNOSIS — I10 Essential (primary) hypertension: Secondary | ICD-10-CM

## 2021-03-21 DIAGNOSIS — J449 Chronic obstructive pulmonary disease, unspecified: Secondary | ICD-10-CM | POA: Insufficient documentation

## 2021-03-21 DIAGNOSIS — I129 Hypertensive chronic kidney disease with stage 1 through stage 4 chronic kidney disease, or unspecified chronic kidney disease: Secondary | ICD-10-CM | POA: Insufficient documentation

## 2021-03-21 DIAGNOSIS — I509 Heart failure, unspecified: Secondary | ICD-10-CM | POA: Diagnosis not present

## 2021-03-21 DIAGNOSIS — Z794 Long term (current) use of insulin: Secondary | ICD-10-CM | POA: Insufficient documentation

## 2021-03-21 DIAGNOSIS — B1910 Unspecified viral hepatitis B without hepatic coma: Secondary | ICD-10-CM | POA: Insufficient documentation

## 2021-03-21 HISTORY — PX: KNEE ARTHROSCOPY WITH MEDIAL MENISECTOMY: SHX5651

## 2021-03-21 LAB — GLUCOSE, CAPILLARY
Glucose-Capillary: 138 mg/dL — ABNORMAL HIGH (ref 70–99)
Glucose-Capillary: 146 mg/dL — ABNORMAL HIGH (ref 70–99)

## 2021-03-21 SURGERY — ARTHROSCOPY, KNEE, WITH MEDIAL MENISCECTOMY
Anesthesia: General | Site: Knee | Laterality: Right

## 2021-03-21 MED ORDER — LACTATED RINGERS IV SOLN: INTRAVENOUS | Status: DC

## 2021-03-21 MED ORDER — ONDANSETRON HCL 4 MG/2ML IJ SOLN
INTRAMUSCULAR | Status: DC | PRN
  Administered 2021-03-21: 4 mg via INTRAVENOUS

## 2021-03-21 MED ORDER — MIDAZOLAM HCL 2 MG/2ML IJ SOLN
INTRAMUSCULAR | Status: AC
  Filled 2021-03-21: qty 2

## 2021-03-21 MED ORDER — KETAMINE HCL 100 MG/ML IJ SOLN
INTRAMUSCULAR | Status: AC
  Filled 2021-03-21: qty 1

## 2021-03-21 MED ORDER — KETAMINE HCL 100 MG/ML IJ SOLN
INTRAMUSCULAR | Status: DC | PRN
  Administered 2021-03-21: 60 mg via INTRAMUSCULAR

## 2021-03-21 MED ORDER — FENTANYL CITRATE (PF) 100 MCG/2ML IJ SOLN
INTRAMUSCULAR | Status: AC
  Filled 2021-03-21: qty 2

## 2021-03-21 MED ORDER — BUPIVACAINE HCL (PF) 0.25 % IJ SOLN
INTRAMUSCULAR | Status: DC | PRN
Start: 2021-03-21 — End: 2021-03-21
  Administered 2021-03-21: 20 mL

## 2021-03-21 MED ORDER — PROPOFOL 10 MG/ML IV BOLUS
INTRAVENOUS | Status: DC | PRN
  Administered 2021-03-21: 150 mg via INTRAVENOUS

## 2021-03-21 MED ORDER — CEFAZOLIN IN SODIUM CHLORIDE 3-0.9 GM/100ML-% IV SOLN
3.0000 g | INTRAVENOUS | Status: AC
  Administered 2021-03-21: 3 g via INTRAVENOUS

## 2021-03-21 MED ORDER — DEXMEDETOMIDINE HCL IN NACL 80 MCG/20ML IV SOLN
INTRAVENOUS | Status: AC
  Filled 2021-03-21: qty 60

## 2021-03-21 MED ORDER — FENTANYL CITRATE (PF) 100 MCG/2ML IJ SOLN
INTRAMUSCULAR | Status: DC | PRN
  Administered 2021-03-21 (×2): 50 ug via INTRAVENOUS
  Administered 2021-03-21: 25 ug via INTRAVENOUS

## 2021-03-21 MED ORDER — OXYCODONE-ACETAMINOPHEN 5-325 MG PO TABS
1.0000 | ORAL_TABLET | Freq: Every day | ORAL | 0 refills | Status: DC | PRN
Start: 2021-03-21 — End: 2021-05-03

## 2021-03-21 MED ORDER — LIDOCAINE HCL (CARDIAC) PF 100 MG/5ML IV SOSY
PREFILLED_SYRINGE | INTRAVENOUS | Status: DC | PRN
Start: 2021-03-21 — End: 2021-03-21
  Administered 2021-03-21: 60 mg via INTRAVENOUS

## 2021-03-21 MED ORDER — ACETAMINOPHEN 500 MG PO TABS
1000.0000 mg | ORAL_TABLET | Freq: Once | ORAL | Status: AC
  Administered 2021-03-21: 1000 mg via ORAL

## 2021-03-21 MED ORDER — CEFAZOLIN IN SODIUM CHLORIDE 3-0.9 GM/100ML-% IV SOLN
INTRAVENOUS | Status: AC
  Filled 2021-03-21: qty 100

## 2021-03-21 MED ORDER — SODIUM CHLORIDE 0.9 % IR SOLN
Status: DC | PRN
Start: 2021-03-21 — End: 2021-03-21
  Administered 2021-03-21: 6000 mL

## 2021-03-21 MED ORDER — ACETAMINOPHEN 500 MG PO TABS
ORAL_TABLET | ORAL | Status: AC
  Filled 2021-03-21: qty 2

## 2021-03-21 MED ORDER — FENTANYL CITRATE (PF) 100 MCG/2ML IJ SOLN
25.0000 ug | INTRAMUSCULAR | Status: DC | PRN
  Administered 2021-03-21 (×2): 25 ug via INTRAVENOUS

## 2021-03-21 SURGICAL SUPPLY — 34 items
BANDAGE ESMARK 6X9 LF (GAUZE/BANDAGES/DRESSINGS) IMPLANT
BLADE EXCALIBUR 4.0X13 (MISCELLANEOUS) ×2 IMPLANT
BLADE SHAVER TORPEDO 4X13 (MISCELLANEOUS) IMPLANT
BNDG CMPR 9X6 STRL LF SNTH (GAUZE/BANDAGES/DRESSINGS)
BNDG ELASTIC 6X5.8 VLCR STR LF (GAUZE/BANDAGES/DRESSINGS) ×4 IMPLANT
BNDG ESMARK 6X9 LF (GAUZE/BANDAGES/DRESSINGS)
BURR OVAL 8 FLU 4.0X13 (MISCELLANEOUS) IMPLANT
COOLER ICEMAN CLASSIC (MISCELLANEOUS) ×2 IMPLANT
CUFF TOURN SGL QUICK 34 (TOURNIQUET CUFF) ×2
CUFF TRNQT CYL 34X4.125X (TOURNIQUET CUFF) ×1 IMPLANT
CUTTER BONE 4.0MM X 13CM (MISCELLANEOUS) IMPLANT
DRAPE ARTHROSCOPY W/POUCH 90 (DRAPES) ×2 IMPLANT
DRAPE IMP U-DRAPE 54X76 (DRAPES) ×2 IMPLANT
DRAPE U-SHAPE 47X51 STRL (DRAPES) ×2 IMPLANT
DURAPREP 26ML APPLICATOR (WOUND CARE) ×4 IMPLANT
EXCALIBUR 3.8MM X 13CM (MISCELLANEOUS) IMPLANT
GAUZE SPONGE 4X4 12PLY STRL (GAUZE/BANDAGES/DRESSINGS) ×2 IMPLANT
GAUZE XEROFORM 1X8 LF (GAUZE/BANDAGES/DRESSINGS) ×2 IMPLANT
GLOVE SURG SYN 7.5  E (GLOVE) ×2
GLOVE SURG SYN 7.5 E (GLOVE) ×1 IMPLANT
GLOVE SURG SYN 7.5 PF PI (GLOVE) ×1 IMPLANT
GLOVE SURG UNDER POLY LF SZ7 (GLOVE) ×5 IMPLANT
GLOVE SURG UNDER POLY LF SZ7.5 (GLOVE) ×2 IMPLANT
GOWN STRL REIN XL XLG (GOWN DISPOSABLE) ×4 IMPLANT
GOWN STRL REUS W/ TWL LRG LVL3 (GOWN DISPOSABLE) ×1 IMPLANT
GOWN STRL REUS W/TWL LRG LVL3 (GOWN DISPOSABLE) ×2
MANIFOLD NEPTUNE II (INSTRUMENTS) ×2 IMPLANT
PACK ARTHROSCOPY DSU (CUSTOM PROCEDURE TRAY) ×2 IMPLANT
PACK BASIN DAY SURGERY FS (CUSTOM PROCEDURE TRAY) ×2 IMPLANT
PAD COLD SHLDR WRAP-ON (PAD) ×2 IMPLANT
SHEET MEDIUM DRAPE 40X70 STRL (DRAPES) ×2 IMPLANT
SUT ETHILON 3 0 PS 1 (SUTURE) ×2 IMPLANT
TOWEL GREEN STERILE FF (TOWEL DISPOSABLE) ×2 IMPLANT
TUBING ARTHROSCOPY IRRIG 16FT (MISCELLANEOUS) ×2 IMPLANT

## 2021-03-21 NOTE — Anesthesia Postprocedure Evaluation (Signed)
Anesthesia Post Note  Patient: ISAAC LACSON  Procedure(s) Performed: RIGHT KNEE ARTHROSCOPY WITH MEDIAL AND PARTIAL LATERAL MENISCECTOMY (Right: Knee)     Patient location during evaluation: PACU Anesthesia Type: General Level of consciousness: awake and alert Pain management: pain level controlled Vital Signs Assessment: post-procedure vital signs reviewed and stable Respiratory status: spontaneous breathing, nonlabored ventilation, respiratory function stable and patient connected to nasal cannula oxygen Cardiovascular status: blood pressure returned to baseline and stable Postop Assessment: no apparent nausea or vomiting Anesthetic complications: no   No notable events documented.  Last Vitals:  Vitals:   03/21/21 1420 03/21/21 1430  BP:  (!) 159/95  Pulse: 86 85  Resp: 17 (!) 21  Temp:    SpO2: 96% 97%    Last Pain:  Vitals:   03/21/21 1420  TempSrc:   PainSc: 5                  Janos Shampine L Jasia Hiltunen

## 2021-03-21 NOTE — Op Note (Signed)
° °  Surgery Date: 03/21/2021  PREOPERATIVE DIAGNOSES:  1. Right knee medial and lateral meniscus tear 2. Right knee synovitis 3. Right knee chondromalacia  POSTOPERATIVE DIAGNOSES:  same  PROCEDURES PERFORMED:  1. Right knee arthroscopy with major synovectomy 2. Right knee arthroscopy with arthroscopic partial medial and lateral meniscectomy 3. Right knee arthroscopy with arthroscopic chondroplasty lateral femoral condyle, patella, femoral trochlea  SURGEON: N. Eduard Roux, M.D.  ASSIST: [name], [place]; necessary for the timely completion of procedure and due to complexity of procedure.  ANESTHESIA:  general  FLUIDS: Per anesthesia record.   ESTIMATED BLOOD LOSS: minimal  DESCRIPTION OF PROCEDURE: [name] is a 56 y.o.-year-old female with above mentioned conditions. Full discussion held regarding risks benefits alternatives and complications related surgical intervention. Conservative care options reviewed. All questions answered.  The patient was identified in the preoperative holding area and the operative extremity was marked. The patient was brought to the operating room and transferred to operating table in a supine position. Satisfactory general anesthesia was induced by anesthesiology.    Standard anterolateral, anteromedial arthroscopy portals were obtained. The anteromedial portal was obtained with a spinal needle for localization under direct visualization with subsequent diagnostic findings.   We first found moderate synovitis in the medial lateral and superior gutters.  Major synovectomy was performed.  Valgus force was then placed on the knee and we found a small radial tear of the posterior horn.  This was debrided back to stable margins.  Overall the medial meniscus was stable and functional.  She had minimal chondromalacia of the medial femoral condyle.  The cruciates were unremarkable.  The knee was then flexed into the figure-of-four position and there  was grade 4 changes the lateral femoral condyle.  We did find a complex tear of the posterior horn the lateral meniscus.  Partial lateral meniscectomy was performed back to stable margins.  The knee was then placed into extension and chondroplasty was performed for the posterior surface of the patella and the femoral trochlea.  Gutters were checked for loose bodies.  Excess fluid was removed from the knee joint.  Incisions were closed with interrupted nylon sutures.  Sterile dressings were applied.  Patient tolerated procedure well had no immediate complications.  Suprapatellar pouch and gutters: moderate synovitis or debris. Patella chondral surface: Grade 3 Trochlear chondral surface: Grade 4 Patellofemoral tracking: Normal Medial meniscus: Radial tear posterior horn.  Medial femoral condyle weight bearing surface: Grade 1 Medial tibial plateau: Grade 1 Anterior cruciate ligament:stable Posterior cruciate ligament:stable Lateral meniscus: Complex tear posterior horn.   Lateral femoral condyle weight bearing surface: Grade 4 Lateral tibial plateau: Grade 2  DISPOSITION: The patient was awakened from general anesthetic, extubated, taken to the recovery room in medically stable condition, no apparent complications. The patient may be weightbearing as tolerated to the operative lower extremity.  Range of motion of right knee as tolerated.  Azucena Cecil, MD [facility] 1:52 PM

## 2021-03-21 NOTE — Anesthesia Preprocedure Evaluation (Addendum)
Anesthesia Evaluation  Patient identified by MRN, date of birth, ID band Patient awake    Reviewed: Allergy & Precautions, NPO status , Patient's Chart, lab work & pertinent test results  Airway Mallampati: I  TM Distance: >3 FB Neck ROM: Full    Dental  (+) Edentulous Upper, Dental Advisory Given,    Pulmonary shortness of breath, asthma , sleep apnea , COPD,  COPD inhaler, Current SmokerPatient did not abstain from smoking.,    Pulmonary exam normal breath sounds clear to auscultation       Cardiovascular hypertension, +CHF  Normal cardiovascular exam+ dysrhythmias Supra Ventricular Tachycardia  Rhythm:Regular Rate:Normal  TTE 2019 - Left ventricle: The cavity size was normal. Wall thickness was  normal. Systolic function was normal. The estimated ejection  fraction was in the range of 55% to 60%. Wall motion was normal;  there were no regional wall motion abnormalities. Doppler  parameters are consistent with abnormal left ventricular  relaxation (grade 1 diastolic dysfunction).  Stress Test 2021 The left ventricular ejection fraction is mildly decreased (45-54%). Nuclear stress EF: 47%. There was no ST segment deviation noted during stress. This is an intermediate risk study due to reduced systolic function. There is no ischemia. New wall motion abnormalities noted. Consider echo if clinically warranted.    Neuro/Psych  Headaches, PSYCHIATRIC DISORDERS Anxiety Depression Bipolar Disorder    GI/Hepatic GERD  ,(+)     substance abuse  alcohol use, Hepatitis -, B  Endo/Other  diabetes, Type 2, Insulin DependentMorbid obesity (BMI 46)  Renal/GU Renal InsufficiencyRenal disease  negative genitourinary   Musculoskeletal  (+) Arthritis ,   Abdominal   Peds  Hematology negative hematology ROS (+)   Anesthesia Other Findings   Reproductive/Obstetrics                             Anesthesia Physical Anesthesia Plan  ASA: 3  Anesthesia Plan: General   Post-op Pain Management: Tylenol PO (pre-op)* and Ketamine IV*   Induction: Intravenous  PONV Risk Score and Plan: 2 and Ondansetron, Dexamethasone and Midazolam  Airway Management Planned: LMA  Additional Equipment:   Intra-op Plan:   Post-operative Plan: Extubation in OR  Informed Consent: I have reviewed the patients History and Physical, chart, labs and discussed the procedure including the risks, benefits and alternatives for the proposed anesthesia with the patient or authorized representative who has indicated his/her understanding and acceptance.     Dental advisory given  Plan Discussed with: CRNA  Anesthesia Plan Comments:         Anesthesia Quick Evaluation

## 2021-03-21 NOTE — Anesthesia Procedure Notes (Signed)
Procedure Name: LMA Insertion Date/Time: 03/21/2021 1:11 PM Performed by: Signe Colt, CRNA Pre-anesthesia Checklist: Patient identified, Emergency Drugs available, Suction available and Patient being monitored Patient Re-evaluated:Patient Re-evaluated prior to induction Oxygen Delivery Method: Circle System Utilized Preoxygenation: Pre-oxygenation with 100% oxygen Induction Type: IV induction Ventilation: Mask ventilation without difficulty LMA: LMA inserted LMA Size: 4.0 Number of attempts: 1 Airway Equipment and Method: bite block Placement Confirmation: positive ETCO2 Tube secured with: Tape Dental Injury: Teeth and Oropharynx as per pre-operative assessment

## 2021-03-21 NOTE — H&P (Signed)
[name], Mary meniscal Osborne  HPI: [name] is a 56 y.o. female who presents for surgical treatment of right knee medial meniscal Osborne, [name], [name], [name]    Bipolar disorder ([place])    Cardiomegaly    Carpal tunnel syndrome, bilateral    Cervical cancer ([place]) 1993   cervical, no treatment done, went away per pt   Cervical dysplasia or atypia 04/10/2011   '93- once dx.-got pregnant-no intervention, then postpartum, no dysplasia found   CHF (congestive heart failure) ([place])    no cardiologist 2014 dx, none now   Chronic hypoxemic respiratory failure ([place])    Chronic kidney disease    stage 3 kidney disease   Condyloma - gluteal cleft 04/09/2011   Removed by general surgery. Pathology showed Condyloma, gluteal CONDYLOMA ACUMINATUM.    COPD (chronic obstructive pulmonary disease) ([place])    Depression    Diabetes mellitus without complication ([place])    no meds today   Dyspnea    with actity, sitting   Fall 12/16/2016   Fibrocystic breast disease    GERD (gastroesophageal reflux disease)    Grade I diastolic dysfunction 81/19/1478   Noted on ECHO   Hepatitis    hep B-count is low at present,doesn't register   History of PSVT (paroxysmal supraventricular tachycardia)    Hypercalcemia    Hyperlipidemia    Hypertension    Incomplete left bundle branch block (LBBB) 09/24/2018   Noted on EKG   Lung nodule 11/2017   right and left lung   Migraine headache    none recent   Morbid obesity ([place]) 03/27/2006   Neuropathy    Pneumonia    walking pneu 15 yrs. ago   Skin lesion 03/15/2011   In gluteal crease now s/p removal by Dr. [name] of General Surgery on 3/12. Path shows condyloma.      Sleep apnea 04/10/2011   does not use after loss    Trigger finger    left third   Urge incontinence of urine    Past Surgical History:  Procedure Laterality Date   ANAL FISTULECTOMY  04/17/2011   Procedure: FISTULECTOMY ANAL;  Surgeon: Imogene Burn. [name], MD;  Location: [facility];  Service: General;  Laterality: N/A;  Excision of Condyloma Gluteal Cleft    ANAL RECTAL MANOMETRY N/A 03/26/2019   Procedure: ANO RECTAL MANOMETRY;  Surgeon: Thornton Park, MD;  [facility];  Service: Gastroenterology;  Laterality: N/A;   Mary SURGERY  2009   pt thinks it was an "interbody fusion"   BIOPSY  03/09/2019   Procedure: BIOPSY;  Surgeon: Thornton Park, MD;  Location: [facility];  Service: Gastroenterology;;   BOTOX INJECTION N/A 12/29/2018   Procedure: BOTOX INJECTION(100 UNITS)  WITH CYSTOSCOPY;  Surgeon: Ceasar Mons, MD;  [facility];  Service: Urology;  Laterality: N/A;   BOTOX INJECTION N/A 07/27/2019   Procedure: BOTOX INJECTION WITH CYSTOSCOPY;  Surgeon: Ceasar Mons, MD;  [facility];  Service: Urology;  Laterality: N/A;  ONLY NEEDS 30 MIN   BREAST CYST EXCISION     bilateral breast, 3 cysts removed from each breast   BREAST EXCISIONAL BIOPSY Right    x 3   BREAST EXCISIONAL BIOPSY Left  x 3   CARPAL TUNNEL RELEASE Bilateral    Cervical biospy     CERVICAL FUSION  04/10/2011   x3- cervical fusion with plating and screws-Dr. [name]   COLONOSCOPY WITH PROPOFOL N/A 07/06/2015   Procedure: COLONOSCOPY WITH PROPOFOL;  Surgeon: [name], MD;  [facility];  Service: Endoscopy;  Laterality: N/A;   COLONOSCOPY WITH PROPOFOL N/A 03/09/2019   Procedure: COLONOSCOPY WITH PROPOFOL;  Surgeon: Thornton Park, MD;  [facility];  Service: Gastroenterology;  Laterality: N/A;   DOPPLER ECHOCARDIOGRAPHY  04/06/2012   AT [place] 55-60%   FOOT SURGERY Left 2018   FRACTURE SURGERY     little toe left foot   I & D EXTREMITY Left 12/18/2016   Procedure: IRRIGATION  AND DEBRIDEMENT LEFT FOOT, CLOSURE;  Surgeon: [name], MD;  Location: [place];  Service: Orthopedics;  Laterality: Left;   INJECTION KNEE Right 10/03/2020   Procedure: KNEE INJECTION;  Surgeon: Jessy Oto, MD;  Location: [place];  Service: Orthopedics;  Laterality: Right;   LAPAROSCOPIC GASTRIC SLEEVE RESECTION N/A 05/08/2020   Procedure: LAPAROSCOPIC GASTRIC SLEEVE RESECTION;  Surgeon: Kieth Brightly Arta Bruce, MD;  [facility];  Service: General;  Laterality: N/A;   [place] SURGERY  04/10/2011   x5-Lumbar fusion-retained hardware.(Dr. Louanne Skye)   LUMBAR LAMINECTOMY N/A 10/03/2020   Procedure: THORACOLUMBAR LAMINECTOMIES THORACIC ELEVEN-TWELVE, THORACIC TWELVE-LUMBAR ONE  AND LUMBAT ONE-TWO;  Surgeon: Jessy Oto, MD;  Location: [place];  Service: Orthopedics;  Laterality: N/A;   MULTIPLE TOOTH EXTRACTIONS     POLYPECTOMY  03/09/2019   Procedure: POLYPECTOMY;  Surgeon: Thornton Park, MD;  [facility];  Service: Gastroenterology;;   SHOULDER ARTHROSCOPY WITH ROTATOR CUFF REPAIR AND SUBACROMIAL DECOMPRESSION Left 05/07/2019   Procedure: LEFT SHOULDER ARTHROSCOPY WITH DEBRIDEMENT, DISTAL CLAVICLE EXCISION,  SUBACROMIAL DECOMPRESSION AND POSSIBLE ROTATOR CUFF REPAIR;  Surgeon: [name], MD;  Location: [place];  Service: Orthopedics;  Laterality: Left;   TEE WITHOUT CARDIOVERSION N/A 04/06/2012   Procedure: TRANSESOPHAGEAL ECHOCARDIOGRAM (TEE);  Surgeon: [name], MD;  [facility];  Service: Cardiovascular;  Laterality: N/A;   TRIGGER FINGER RELEASE Right    middle finger   TRIGGER FINGER RELEASE Left 06/28/2016   Procedure: RELEASE TRIGGER FINGER LEFT 3RD FINGER;  Surgeon: [name], MD;  Location: [place];  Service: Orthopedics;  Laterality: Left;   TRIGGER FINGER RELEASE Right 06/12/2016   Procedure: RIGHT INDEX FINGER TRIGGER RELEASE;  Surgeon: [name], MD;  Location: [place];  Service: Orthopedics;  Laterality: Right;   TRIGGER FINGER RELEASE  Right 01/19/2018   Procedure: RIGHT RING FINGER TRIGGER FINGER RELEASE;  Surgeon: [name], MD;  Location: [place];  Service: Orthopedics;  Laterality: Right;   TRIGGER FINGER RELEASE Left 05/07/2019   Procedure: RELEASE TRIGGER FINGER LEFT INDEX FINGER;  Surgeon: [name], MD;  Location: [place];  Service: Orthopedics;  Laterality: Left;   UPPER GI ENDOSCOPY N/A 05/08/2020   Procedure: UPPER GI ENDOSCOPY;  Surgeon: Kieth Brightly, Arta Bruce, MD;  [facility];  Service: General;  Laterality: N/A;   Social History   Socioeconomic History   Marital status: Divorced    Spouse name: Not on file   Number of children: 1   Years of education: 12   Highest education level: 12th grade  Occupational History   Occupation: disabled    Comment: Disabled  Tobacco Use   Smoking status: Every Day    Packs/day: 0.20  Years: 40.00    Pack years: 8.00    Types: Cigarettes    Start date: 01/28/1977   Smokeless tobacco: Never  Vaping Use   Vaping Use: Never used  Substance and Sexual Activity   Alcohol use: No    Alcohol/week: 0.0 standard drinks   Drug use: Not Currently    Types: Marijuana, "Crack" cocaine    Comment: hx marijuana usemany years ago, Crack -none since 11/2015   Sexual activity: Not Currently    Partners: Male    Birth control/protection: Post-menopausal  Other Topics Concern   Not on file  Social History Narrative   Current Social History       Who lives at home: Patient lives alone in one level home; has 4 steps onto porch with a handrail. Has smoke alarms, no throw rugs. Has elevated toilet. Has shower bench in tub with grab rails.    Transportation: Patient has own vehicle and drives herself    Important Relationships "Family and friends"    Pets: None    Likes to eat varied diet. Seafood, fish, roasted [place] breast, vegetables and salads and fruits.   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 I'm a lovable person."                                                                                                   Social Determinants of Radio broadcast assistant Strain: Low Risk    Difficulty of Paying Living Expenses: Not hard at all  Food Insecurity: No Food Insecurity   Worried About Charity fundraiser in the Last Year: Never true   Arboriculturist in the Last Year: Never true  Transportation Needs: No Transportation Needs   Lack of Transportation (Medical): No   Lack of Transportation (Non-Medical): No  Physical Activity: Inactive   Days of Exercise per Week: 0 days   Minutes of Exercise per Session: 0 min  Stress: No Stress Concern Present   Feeling of Stress : Not at all  Social Connections: Moderately Isolated   Frequency of Communication with Friends and Family: More than three times a week   Frequency of Social Gatherings with Friends and Family: More than three times a week   Attends Religious Services: More than 4 times per year   Active Member of [employer] or Organizations: No   Attends Archivist Meetings: Never   Marital Status: Divorced   Family History  Problem Relation Age of Onset   Pulmonary embolism Mother    Stroke Father    Alcohol abuse Father    Pneumonia Father    Hypertension Father    Breast cancer Maternal Aunt    Hypertension Other    Diabetes Other  Aunts and cousins   Osborne Sister    Depression Sister    Arthritis Sister    Sickle cell anemia Son    Allergies  Allergen Reactions   Aspirin Other (See Comments)    Kidney disease   Methadone Hcl Other (See Comments)     "blacked out" in 1990's   Corticosteroids Other (See Comments)    Hyperglycemia    Levofloxacin Itching   Prior to Admission medications   Medication Sig  Start Date End Date Taking? Authorizing Provider  acetaminophen (TYLENOL) 500 MG tablet Take 1,000 mg by mouth 2 (two) times daily.   Yes [provider]  albuterol (VENTOLIN HFA) 108 (90 Base) MCG/ACT inhaler TAKE 2 PUFFS BY MOUTH EVERY 6 HOURS AS NEEDED FOR WHEEZE OR SHORTNESS OF BREATH 03/19/21  Yes [name], MD  allopurinol (ZYLOPRIM) 300 MG tablet Take 300 mg by mouth in the morning. 10/06/19  Yes [provider]  atorvastatin (LIPITOR) 40 MG tablet Take 1 tablet (40 mg total) by mouth daily. 01/15/21  Yes [name], MD  b complex vitamins capsule Take 1 capsule by mouth daily.   Yes [provider]  baclofen (LIORESAL) 10 MG tablet Take 1 tablet (10 mg total) by mouth 3 (three) times daily as needed for muscle spasms. Patient taking differently: Take 10 mg by mouth 3 (three) times daily. 03/24/18  Yes [name], MD  busPIRone (BUSPAR) 10 MG tablet TAKE 1 TABLET BY MOUTH THREE TIMES A DAY 10/30/20  Yes [name], MD  calcium carbonate (OSCAL) 1500 (600 Ca) MG TABS tablet Take 1,500 mg by mouth 3 (three) times daily with meals.   Yes [provider]  diclofenac Sodium (VOLTAREN) 1 % GEL APPLY 2 GRAMS TO AFFECTED AREA 4 TIMES A DAY 02/21/21  Yes [name], MD  diltiazem (CARDIZEM CD) 120 MG 24 hr capsule TAKE 1 CAPSULE (120 MG TOTAL) BY MOUTH 2 (TWO) TIMES DAILY. 09/11/20 09/06/21 Yes [name], MD  docusate sodium (COLACE) 100 MG capsule Take 200 mg by mouth 2 (two) times daily.   Yes [provider]  fluticasone (FLONASE) 50 MCG/ACT nasal spray PLACE 2 SPRAYS INTO BOTH NOSTRILS TWICE DAILY 08/21/20  Yes [name], MD  Fluticasone-Umeclidin-Vilant (TRELEGY ELLIPTA) 100-62.5-25 MCG/INH AEPB Inhale 1 puff into the lungs daily. 03/08/20  Yes Icard, Bradley L, DO  gabapentin (NEURONTIN) 300 MG capsule Take 2 capsules (600 mg total) by mouth 3 (three) times daily. TAKE 2 CAPSULES (600 MG TOTAL) BY MOUTH in the morning  and at lunch and take 3 capsules at night 08/06/19  Yes [name], MD  hydrOXYzine (ATARAX/VISTARIL) 10 MG tablet Take 1 tablet (10 mg total) by mouth daily. 10/30/20  Yes [name], MD  losartan (COZAAR) 100 MG tablet Take 1 tablet (100 mg total) by mouth daily. 05/29/17  Yes [name], MD  metolazone (ZAROXOLYN) 2.5 MG tablet TAKE 1 TABLET EVERY OTHER DAY. TAKE 1 TABLET TWICE WEEKLY ON MONDAYS AND THURSDAYS ONLY 06/12/20  Yes [name], MD  montelukast (SINGULAIR) 10 MG tablet TAKE 1 TABLET BY MOUTH EVERYDAY AT BEDTIME 09/11/20  Yes [name], MD  Multiple Vitamins-Minerals (BARIATRIC MULTIVITAMINS/IRON) CAPS Take 1 capsule by mouth daily.   Yes [provider]  nicotine (NICOTINE STEP 1) 21 mg/24hr patch Place 1 patch (21 mg total) onto the skin daily. 02/21/21  Yes Leavy Cella, RPH-CPP  omeprazole (PRILOSEC) 20 MG capsule TAKE 1 CAPSULE BY MOUTH EVERY DAY 01/09/21  Yes [name], MD  Potassium Chloride ER 20 MEQ TBCR Take 20 mEq by mouth daily. 09/09/20  Yes [provider]  venlafaxine XR (EFFEXOR-XR) 75 MG 24 hr capsule TAKE 1 CAPSULE BY MOUTH DAILY WITH BREAKFAST. 11/07/20  Yes [name], MD  ACCU-CHEK AVIVA PLUS test strip 1 EACH BY OTHER ROUTE 3 (THREE) TIMES DAILY. TEST 3 TIMES DAILY 08/12/20   [name], MD  Accu-Chek Softclix Lancets lancets USE TO TEST 4 TIMES A DAY 06/12/20   [name], MD  B-D UF III MINI PEN NEEDLES 31G X 5 MM MISC CHECK SUGARS 4 TIMES A DAY BEFORE MEALS AND AT BEDTIME. 12/17/19   [name], MD  blood glucose meter kit and supplies KIT Dispense based on patient and insurance preference. Use up to four times daily as directed. (FOR ICD-9 250.00, 250.01). 12/20/16   Bonnita Hollow, MD  insulin lispro (HUMALOG KWIKPEN) 100 UNIT/ML KwikPen Inject 3-4 Units into the skin 3 (three) times daily. 12/19/20   [name], MD  loratadine (CLARITIN) 10 MG tablet Take 1 tablet (10 mg total) by  mouth daily. 06/15/20   [name], MD  NARCAN 4 MG/0.1ML LIQD nasal spray kit Place 1 spray into the nose as needed (accidental overdose). 02/16/18   [provider]  nystatin (MYCOSTATIN) 100000 UNIT/ML suspension TAKE 5 MLS (500,000 UNITS TOTAL) BY MOUTH 4 (FOUR) TIMES DAILY. SWISH AND SWALLOW. 02/01/21   [name], MD     Positive ROS: All other systems have been reviewed and were otherwise negative with the exception of those mentioned in the HPI and as above.  Physical Exam: General: Alert, no acute distress Cardiovascular: No pedal edema Respiratory: No cyanosis, no use of accessory musculature GI: abdomen soft Skin: No lesions in the area of chief complaint Neurologic: Sensation intact distally Psychiatric: Patient is competent for consent with normal mood and affect Lymphatic: no lymphedema  MUSCULOSKELETAL: exam stable  Assessment: right knee medial meniscal Osborne, Mary meniscal Osborne  Plan: Plan for Procedure(s): RIGHT KNEE ARTHROSCOPY WITH MEDIAL AND PARTIAL Mary MENISCECTOMY  The risks benefits and alternatives were discussed with the patient including but not limited to the risks of nonoperative treatment, versus surgical intervention including infection, bleeding, nerve injury,  blood clots, cardiopulmonary complications, morbidity, mortality, among others, and they were willing to proceed.   Mary templating of the joint replacement has been completed, documented, and submitted to the Operating Room personnel in order to optimize intra-operative equipment management.   Eduard Roux, MD 03/21/2021 11:08 AM

## 2021-03-21 NOTE — Discharge Instructions (Addendum)
No Tylenol until 5:25 today if needed.  Post-operative patient instructions  Knee Arthroscopy   Ice:  Place intermittent ice or cooler pack over your knee, 30 minutes on and 30 minutes off.  Continue this for the first 72 hours after surgery, then save ice for use after therapy sessions or on more active days.   Weight:  You may bear weight on your leg as your symptoms allow. Crutches:  Use crutches (or walker) to assist in walking until told to discontinue by your physical therapist or physician. This will help to reduce pain. Strengthening:  Perform simple thigh squeezes (isometric quad contractions) and straight leg lifts as you are able (3 sets of 5 to 10 repetitions, 3 times a day).  For the leg lifts, have someone support under your ankle in the beginning until you have increased strength enough to do this on your own.  To help get started on thigh squeezes, place a pillow under your knee and push down on the pillow with back of knee (sometimes easier to do than with your leg fully straight). Motion:  Perform gentle knee motion as tolerated - this is gentle bending and straightening of the knee. Seated heel slides: you can start by sitting in a chair, remove your brace, and gently slide your heel back on the floor - allowing your knee to bend. Have someone help you straighten your knee (or use your other leg/foot hooked under your ankle.  Dressing:  Perform 1st dressing change at 2 days postoperative. A moderate amount of blood tinged drainage is to be expected.  So if you bleed through the dressing on the first or second day or if you have fevers, it is fine to change the dressing/check the wounds early and redress wound. Elevate your leg.  If it bleeds through again, or if the incisions are leaking frank blood, please call the office. May change dressing every 1-2 days thereafter to help watch wounds. Can purchase Tegaderm (or 44M Nexcare) water resistant dressings at local pharmacy /  [facility]. Shower:  Light shower is ok after 2 days.  Please take shower, NO bath. Recover with gauze and ace wrap to help keep wounds protected.   Pain medication:  A narcotic pain medication has been prescribed.  Take as directed.  Typically you need narcotic pain medication more regularly during the first 3 to 5 days after surgery.  Decrease your use of the medication as the pain improves.  Narcotics can sometimes cause constipation, even after a few doses.  If you have problems with constipation, you can take an over the counter stool softener or light laxative.  If you have persistent problems, please notify your physicians office. Physical therapy: Additional activity guidelines to be provided by your physician or physical therapist at follow-up visits.  Driving: Do not recommend driving x 2 weeks post surgical, especially if surgery performed on right side. Should not drive while taking narcotic pain medications. It typically takes at least 2 weeks to restore sufficient neuromuscular function for normal reaction times for driving safety.  Call [phone] for questions or problems. Evenings you will be forwarded to the hospital operator.  Ask for the orthopaedic physician on call. Please call if you experience:    Redness, foul smelling, or persistent drainage from the surgical site  worsening knee pain and swelling not responsive to medication  any calf pain and or swelling of the lower leg  temperatures greater than 101.5 F other questions or concerns  Thank you for allowing [place] to be a part of your care.   Post Anesthesia Home Care Instructions  Activity: Get plenty of rest for the remainder of the day. A responsible individual must stay with you for 24 hours following the procedure.  For the next 24 hours, DO NOT: -Drive a car -Paediatric nurse -Drink alcoholic beverages -Take any medication unless instructed by your physician -Make any legal decisions or sign important  papers.  Meals: Start with liquid foods such as gelatin or soup. Progress to regular foods as tolerated. Avoid greasy, spicy, heavy foods. If nausea and/or vomiting occur, drink only clear liquids until the nausea and/or vomiting subsides. Call your physician if vomiting continues.  Special Instructions/Symptoms: Your throat may feel dry or sore from the anesthesia or the breathing tube placed in your throat during surgery. If this causes discomfort, gargle with warm salt water. The discomfort should disappear within 24 hours.  If you had a scopolamine patch placed behind your ear for the management of post- operative nausea and/or vomiting:  1. The medication in the patch is effective for 72 hours, after which it should be removed.  Wrap patch in a tissue and discard in the trash. Wash hands thoroughly with soap and water. 2. You may remove the patch earlier than 72 hours if you experience unpleasant side effects which may include dry mouth, dizziness or visual disturbances. 3. Avoid touching the patch. Wash your hands with soap and water after contact with the patch.

## 2021-03-21 NOTE — Transfer of Care (Signed)
Immediate Anesthesia Transfer of Care Note  Patient: [name]  Procedure(s) Performed: RIGHT KNEE ARTHROSCOPY WITH MEDIAL AND PARTIAL LATERAL MENISCECTOMY (Right: Knee)  Patient Location: PACU  Anesthesia Type:General  Level of Consciousness: drowsy and patient cooperative  Airway & Oxygen Therapy: Patient Spontanous Breathing and Patient connected to face mask oxygen  Post-op Assessment: Report given to RN and Post -op Vital signs reviewed and stable  Post vital signs: Reviewed and stable  Last Vitals:  Vitals Value Taken Time  BP    Temp    Pulse 88 03/21/21 1354  Resp 23 03/21/21 1354  SpO2 89 % 03/21/21 1354  Vitals shown include unvalidated device data.  Last Pain:  Vitals:   03/21/21 1120  TempSrc: Oral  PainSc: 10-Worst pain ever         Complications: No notable events documented.

## 2021-03-22 ENCOUNTER — Encounter (HOSPITAL_BASED_OUTPATIENT_CLINIC_OR_DEPARTMENT_OTHER): Payer: 59 | Admitting: Internal Medicine

## 2021-03-22 ENCOUNTER — Encounter (HOSPITAL_BASED_OUTPATIENT_CLINIC_OR_DEPARTMENT_OTHER): Payer: Self-pay | Admitting: Orthopaedic Surgery

## 2021-03-23 ENCOUNTER — Ambulatory Visit (HOSPITAL_BASED_OUTPATIENT_CLINIC_OR_DEPARTMENT_OTHER): Payer: Self-pay | Admitting: Physical Therapy

## 2021-03-23 ENCOUNTER — Encounter (HOSPITAL_BASED_OUTPATIENT_CLINIC_OR_DEPARTMENT_OTHER): Payer: 59 | Admitting: Internal Medicine

## 2021-03-28 ENCOUNTER — Encounter: Payer: Self-pay | Admitting: Family Medicine

## 2021-03-28 ENCOUNTER — Ambulatory Visit (INDEPENDENT_AMBULATORY_CARE_PROVIDER_SITE_OTHER): Payer: 59 | Admitting: Family Medicine

## 2021-03-28 ENCOUNTER — Ambulatory Visit (HOSPITAL_BASED_OUTPATIENT_CLINIC_OR_DEPARTMENT_OTHER): Payer: Self-pay | Admitting: Physical Therapy

## 2021-03-28 ENCOUNTER — Other Ambulatory Visit: Payer: Self-pay

## 2021-03-28 VITALS — BP 145/92 | HR 124 | Ht 67.0 in | Wt 294.2 lb

## 2021-03-28 DIAGNOSIS — I499 Cardiac arrhythmia, unspecified: Secondary | ICD-10-CM

## 2021-03-28 DIAGNOSIS — M792 Neuralgia and neuritis, unspecified: Secondary | ICD-10-CM

## 2021-03-28 DIAGNOSIS — J329 Chronic sinusitis, unspecified: Secondary | ICD-10-CM | POA: Diagnosis not present

## 2021-03-28 MED ORDER — ZIKS ARTHRITIS PAIN RELIEF 0.025-1-12 % EX CREA: 2.0000 g | TOPICAL_CREAM | Freq: Two times a day (BID) | CUTANEOUS | 3 refills | Status: DC | PRN

## 2021-03-28 NOTE — Progress Notes (Signed)
? ? ?  SUBJECTIVE:  ? ?CHIEF COMPLAINT / HPI:  ? ?Dry sinuses ?Patient reports longstanding history of dry sinuses which is worsened recently.  She reports that she takes Flonase daily as well as Claritin and Singulair.  She reports that her mucus will get extremely hard and stick to the side of her nose and she will use a Q-tip or her fingernail to pry the mucus off.  This will cause her to have bloody noses.  She also reports that she has thick postnasal drip which causes her chronic cough. ? ?Foot pain ?Patient reports worsening neuropathic foot pain.  She states that she has been seeing her podiatrist but was told she needs to see someone else and may need a referral for this.  She is unclear of what referral she may need.  She has been taking her gabapentin 600 mg twice daily and 900 mg at night. ? ? ?OBJECTIVE:  ? ?BP (!) 145/92   Pulse (!) 124   Ht 5\' 7"  (1.702 m)   Wt 294 lb 3.2 oz (133.4 kg)   LMP 04/10/2006   SpO2 93%   BMI 46.08 kg/m?   ?General: Pleasant 56 year old female ?HEENT: Dry sinuses, unable to visualize where bleeding had occurred, dry mucous appreciated in superior aspect of the sinuses ?Cardiac: Regular rate and rhythm, no murmurs appreciated ?Respiratory: Normal work of breathing, lungs clear to auscultation bilaterally ?Abdomen: Soft, nontender, positive bowel sounds ?MSK: No gross abnormalities, healing scars to right knee where she recently had right orthoscopic knee surgery ? ?ASSESSMENT/PLAN:  ? ?Neuropathic pain of foot ?Patient with bilateral neuropathic foot pain secondary to her diabetes.  She is currently taking gabapentin 600 mg twice daily and 900 mg at night.  She sees podiatry.  She feels like she needs to see a different podiatrist and needs a referral for this.  We called [facility] and had her scheduled with a different podiatrist who specializes in neuropathic foot pain.  Prescription sent for capsaicin cream for patient to try to see if she can receive any  relief from the pain. ? ?Chronic congestion of paranasal sinus ?Patient currently taking Flonase as well as Allegra.  Recommended patient use humidifier to help with the dryness in her nose and loosen up some of the mucus.  Also recommended Nettie pot.  We will follow-up as needed. ?  ? ? ?[name], MD ?[place]   ?

## 2021-03-28 NOTE — Patient Instructions (Addendum)
It was good seeing you today.  I am glad that your knee surgery went well!  Regarding your dry sinuses I want you to continue the current medications that you are on but I want you to use a humidifier.  I also recommend you picking up a Nettie pot which she can get at your pharmacy.  Regarding her foot pain we have scheduled an appointment with a different podiatrist and I also sent a prescription for capsaicin cream which she can apply to your feet twice a day.  If you have any questions or concerns call the clinic.  Hope you have a great afternoon! ? ? ?

## 2021-03-28 NOTE — Assessment & Plan Note (Signed)
Patient currently taking Flonase as well as Allegra.  Recommended patient use humidifier to help with the dryness in her nose and loosen up some of the mucus.  Also recommended Nettie pot.  We will follow-up as needed. ?

## 2021-03-28 NOTE — Assessment & Plan Note (Signed)
Patient with bilateral neuropathic foot pain secondary to her diabetes.  She is currently taking gabapentin 600 mg twice daily and 900 mg at night.  She sees podiatry.  She feels like she needs to see a different podiatrist and needs a referral for this.  We called [facility] and had her scheduled with a different podiatrist who specializes in neuropathic foot pain.  Prescription sent for capsaicin cream for patient to try to see if she can receive any relief from the pain. ?

## 2021-03-29 ENCOUNTER — Ambulatory Visit (INDEPENDENT_AMBULATORY_CARE_PROVIDER_SITE_OTHER): Payer: 59 | Admitting: Physician Assistant

## 2021-03-29 ENCOUNTER — Encounter: Payer: Self-pay | Admitting: Physician Assistant

## 2021-03-29 DIAGNOSIS — Z9889 Other specified postprocedural states: Secondary | ICD-10-CM

## 2021-03-29 NOTE — Progress Notes (Signed)
Post-Op Visit Note   Patient: [name]           Date of Birth: November 03, 1965           MRN: [id number] Visit Date: 03/29/2021 PCP: [name], MD   Assessment & Plan:  Chief Complaint:  Chief Complaint  Patient presents with   Right Knee - Follow-up    Right knee Arthroscopy 03/21/2021   Visit Diagnoses:  1. S/P right knee arthroscopy     Plan: Patient is a pleasant 56 year old female who comes in today 1 week status post right knee arthroscopic debridement medial and lateral meniscus as well as chondroplasty, date of surgery 03/21/2021.  She has been doing well.  She is taking oxycodone for which she gets from pain management.  She is ambulating with a walker for which she used prior to surgery due to right hip pain.  Examination of the right knee reveals well-healed surgical portals with nylon sutures in place.  No evidence of infection or cellulitis.  Calf is soft nontender.  Today, sutures were removed and Steri-Strips applied.  Intraoperative pictures reviewed.  Home exercise program provided.  Follow-up with [place] in 5 weeks time for recheck.  Call with concerns or questions.  Follow-Up Instructions: Return in about 5 weeks (around 05/03/2021).   Orders:  No orders of the defined types were placed in this encounter.  No orders of the defined types were placed in this encounter.   Imaging: No new imaging  PMFS History: Patient Active Problem List   Diagnosis Date Noted   Neuropathic pain of foot 03/28/2021   Chronic congestion of paranasal sinus 03/28/2021   Impingement syndrome of right shoulder 03/08/2021   Arthritis of right acromioclavicular joint 03/08/2021   Acute medial meniscus tear of right knee 03/08/2021   Acute lateral meniscus tear of right knee 03/08/2021   Spinal stenosis of thoracic region with radiculopathy    Bilateral leg numbness    Spinal stenosis of lumbar region with neurogenic claudication    Unilateral primary osteoarthritis, right  knee    Impaired ambulation 01/10/2021   Status post lumbar laminectomy 10/03/2020   Cubital tunnel syndrome on left 05/02/2020   Vaginal discharge 01/20/2020   Prolapse of anterior vaginal wall 11/10/2019   Skin ulcer of left thigh, limited to breakdown of skin ([place]) 09/09/2019   Ulnar nerve compression 08/06/2019   Diabetic neuropathy associated with type 2 diabetes mellitus ([place]) 08/06/2019   Body mass index 50.0-59.9, adult ([place]) 06/12/2019   Impingement syndrome of left shoulder 05/07/2019   Polyp of colon    Polyp of ascending colon    Rectal discomfort 02/12/2019   Osteoarthritis of right hip 02/12/2019   Nontraumatic tear of left supraspinatus tendon 12/30/2018   Gout 12/17/2018   Nontraumatic complete tear of right rotator cuff 12/08/2018   Rotator cuff tear, right 12/01/2018   Tobacco abuse 08/21/2018   Right groin pain 07/20/2018   Depressed mood 07/13/2018   Paroxysmal SVT (supraventricular tachycardia) ([place]) 03/20/2018   Pure hypercholesterolemia 03/20/2018   Muscle cramping 02/25/2018   Nodule of upper lobe of right lung 02/13/2018   Falls, subsequent encounter 01/30/2018   Pain in left foot 11/04/2017   Essential hypertension 10/27/2017   Solitary pulmonary nodule 10/07/2017   Restrictive lung disease secondary to obesity 10/01/2017   Polycythemia, secondary 10/01/2017   Chronic viral hepatitis B without delta-agent ([place]) 07/08/2017   COPD (chronic obstructive pulmonary disease) with chronic bronchitis ([place]) 06/27/2017   Chronic kidney  disease (CKD), stage IV (severe) ([place]) 01/14/2017   Decubitus ulcer 12/13/2016   Type 2 diabetes mellitus with stage 3 chronic kidney disease, with long-term current use of insulin ([place]) 12/12/2016   Urge incontinence of urine 12/05/2016   Trigger finger, left index finger 07/15/2016   Back pain 04/07/2014   Morbid obesity ([place]) 10/02/2012   Chronic diastolic CHF (congestive heart failure) ([place]) 04/07/2012   GERD 01/26/2010    Obstructive sleep apnea treated with BiPAP 02/03/2008   FIBROCYSTIC BREAST DISEASE 10/28/2006   Hyperlipidemia 03/27/2006   Obesity hypoventilation syndrome ([place]) 03/27/2006   Depression with anxiety 03/27/2006   Past Medical History:  Diagnosis Date   Alcoholism ([place])    Anxiety    Arthritis 04/10/2011   hips, shoulders, back   Asthma    Bipolar disorder ([place])    Cardiomegaly    Carpal tunnel syndrome, bilateral    Cervical cancer ([place]) 1993   cervical, no treatment done, went away per pt   Cervical dysplasia or atypia 04/10/2011   '93- once dx.-got pregnant-no intervention, then postpartum, no dysplasia found   CHF (congestive heart failure) ([place])    no cardiologist 2014 dx, none now   Chronic hypoxemic respiratory failure ([facility])    Chronic kidney disease    stage 3 kidney disease   Condyloma - gluteal cleft 04/09/2011   Removed by general surgery. Pathology showed Condyloma, gluteal CONDYLOMA ACUMINATUM.    COPD (chronic obstructive pulmonary disease) ([place])    Depression    Diabetes mellitus without complication ([place])    no meds today   Dyspnea    with actity, sitting   Fall 12/16/2016   Fibrocystic breast disease    GERD (gastroesophageal reflux disease)    Grade I diastolic dysfunction 27/25/3664   Noted on ECHO   Hepatitis    hep B-count is low at present,doesn't register   History of PSVT (paroxysmal supraventricular tachycardia)    Hypercalcemia    Hyperlipidemia    Hypertension    Incomplete left bundle branch block (LBBB) 09/24/2018   Noted on EKG   Lung nodule 11/2017   right and left lung   Migraine headache    none recent   Morbid obesity ([place]) 03/27/2006   Neuropathy    Pneumonia    walking pneu 15 yrs. ago   Skin lesion 03/15/2011   In gluteal crease now s/p removal by Dr. [name] of General Surgery on 3/12. Path shows condyloma.      Sleep apnea 04/10/2011   does not use after loss   Trigger finger    left third   Urge incontinence of urine      Family History  Problem Relation Age of Onset   Pulmonary embolism Mother    Stroke Father    Alcohol abuse Father    Pneumonia Father    Hypertension Father    Breast cancer Maternal Aunt    Hypertension Other    Diabetes Other        Aunts and cousins   Asthma Sister    Depression Sister    Arthritis Sister    Sickle cell anemia Son     Past Surgical History:  Procedure Laterality Date   ANAL FISTULECTOMY  04/17/2011   Procedure: FISTULECTOMY ANAL;  Surgeon: Imogene Burn. [name], MD;  Location: [facility];  Service: General;  Laterality: N/A;  Excision of Condyloma Gluteal Cleft    ANAL RECTAL MANOMETRY N/A 03/26/2019   Procedure: ANO RECTAL MANOMETRY;  Surgeon: Thornton Park, MD;  [facility];  Service: Gastroenterology;  Laterality: N/A;   BACK SURGERY  2009   pt thinks it was an "interbody fusion"   BIOPSY  03/09/2019   Procedure: BIOPSY;  Surgeon: Thornton Park, MD;  Location: [facility];  Service: Gastroenterology;;   BOTOX INJECTION N/A 12/29/2018   Procedure: BOTOX INJECTION(100 UNITS)  WITH CYSTOSCOPY;  Surgeon: Ceasar Mons, MD;  [facility];  Service: Urology;  Laterality: N/A;   BOTOX INJECTION N/A 07/27/2019   Procedure: BOTOX INJECTION WITH CYSTOSCOPY;  Surgeon: Ceasar Mons, MD;  [facility];  Service: Urology;  Laterality: N/A;  ONLY NEEDS 30 MIN   BREAST CYST EXCISION     bilateral breast, 3 cysts removed from each breast   BREAST EXCISIONAL BIOPSY Right    x 3   BREAST EXCISIONAL BIOPSY Left    x 3   CARPAL TUNNEL RELEASE Bilateral    Cervical biospy     CERVICAL FUSION  04/10/2011   x3- cervical fusion with plating and screws-Dr. [name]   COLONOSCOPY WITH PROPOFOL N/A 07/06/2015   Procedure: COLONOSCOPY WITH PROPOFOL;  Surgeon: [name], MD;  [facility];  Service: Endoscopy;  Laterality: N/A;   COLONOSCOPY WITH PROPOFOL N/A 03/09/2019   Procedure: COLONOSCOPY WITH PROPOFOL;  Surgeon:  Thornton Park, MD;  [facility];  Service: Gastroenterology;  Laterality: N/A;   DOPPLER ECHOCARDIOGRAPHY  04/06/2012   AT [place] 55-60%   FOOT SURGERY Left 2018   FRACTURE SURGERY     little toe left foot   I & D EXTREMITY Left 12/18/2016   Procedure: IRRIGATION AND DEBRIDEMENT LEFT FOOT, CLOSURE;  Surgeon: [name], MD;  Location: [place];  Service: Orthopedics;  Laterality: Left;   INJECTION KNEE Right 10/03/2020   Procedure: KNEE INJECTION;  Surgeon: Jessy Oto, MD;  Location: [place];  Service: Orthopedics;  Laterality: Right;   KNEE ARTHROSCOPY WITH MEDIAL MENISECTOMY Right 03/21/2021   Procedure: RIGHT KNEE ARTHROSCOPY WITH MEDIAL AND PARTIAL LATERAL MENISCECTOMY;  Surgeon: [name], MD;  Location: [place];  Service: Orthopedics;  Laterality: Right;   LAPAROSCOPIC GASTRIC SLEEVE RESECTION N/A 05/08/2020   Procedure: LAPAROSCOPIC GASTRIC SLEEVE RESECTION;  Surgeon: Kieth Brightly Arta Bruce, MD;  [facility];  Service: General;  Laterality: N/A;   [place] SURGERY  04/10/2011   x5-Lumbar fusion-retained hardware.(Dr. Louanne Skye)   LUMBAR LAMINECTOMY N/A 10/03/2020   Procedure: THORACOLUMBAR LAMINECTOMIES THORACIC ELEVEN-TWELVE, THORACIC TWELVE-LUMBAR ONE  AND LUMBAT ONE-TWO;  Surgeon: Jessy Oto, MD;  Location: [place];  Service: Orthopedics;  Laterality: N/A;   MULTIPLE TOOTH EXTRACTIONS     POLYPECTOMY  03/09/2019   Procedure: POLYPECTOMY;  Surgeon: Thornton Park, MD;  [facility];  Service: Gastroenterology;;   SHOULDER ARTHROSCOPY WITH ROTATOR CUFF REPAIR AND SUBACROMIAL DECOMPRESSION Left 05/07/2019   Procedure: LEFT SHOULDER ARTHROSCOPY WITH DEBRIDEMENT, DISTAL CLAVICLE EXCISION,  SUBACROMIAL DECOMPRESSION AND POSSIBLE ROTATOR CUFF REPAIR;  Surgeon: [name], MD;  Location: [place];  Service: Orthopedics;  Laterality: Left;   TEE WITHOUT CARDIOVERSION N/A 04/06/2012   Procedure: TRANSESOPHAGEAL ECHOCARDIOGRAM  (TEE);  Surgeon: [name], MD;  Location: [facility];  Service: Cardiovascular;  Laterality: N/A;   TRIGGER FINGER RELEASE Right    middle finger   TRIGGER FINGER RELEASE Left 06/28/2016   Procedure: RELEASE TRIGGER FINGER LEFT 3RD FINGER;  Surgeon: [name], MD;  Location: [place];  Service: Orthopedics;  Laterality: Left;   TRIGGER FINGER RELEASE Right  06/12/2016   Procedure: RIGHT INDEX FINGER TRIGGER RELEASE;  Surgeon: [name], MD;  Location: [place];  Service: Orthopedics;  Laterality: Right;   TRIGGER FINGER RELEASE Right 01/19/2018   Procedure: RIGHT RING FINGER TRIGGER FINGER RELEASE;  Surgeon: [name], MD;  Location: [place];  Service: Orthopedics;  Laterality: Right;   TRIGGER FINGER RELEASE Left 05/07/2019   Procedure: RELEASE TRIGGER FINGER LEFT INDEX FINGER;  Surgeon: [name], MD;  Location: [place];  Service: Orthopedics;  Laterality: Left;   UPPER GI ENDOSCOPY N/A 05/08/2020   Procedure: UPPER GI ENDOSCOPY;  Surgeon: Kieth Brightly, Arta Bruce, MD;  [facility];  Service: General;  Laterality: N/A;   Social History   Occupational History   Occupation: disabled    Comment: Disabled  Tobacco Use   Smoking status: Every Day    Packs/day: 0.20    Years: 40.00    Pack years: 8.00    Types: Cigarettes    Start date: 01/28/1977   Smokeless tobacco: Never  Vaping Use   Vaping Use: Never used  Substance and Sexual Activity   Alcohol use: No    Alcohol/week: 0.0 standard drinks   Drug use: Not Currently    Types: Marijuana, "Crack" cocaine    Comment: hx marijuana usemany years ago, Crack -none since 11/2015   Sexual activity: Not Currently    Partners: Male    Birth control/protection: Post-menopausal

## 2021-03-30 ENCOUNTER — Encounter (HOSPITAL_BASED_OUTPATIENT_CLINIC_OR_DEPARTMENT_OTHER): Payer: 59 | Admitting: Physical Therapy

## 2021-04-05 ENCOUNTER — Ambulatory Visit: Payer: 59 | Admitting: Podiatry

## 2021-04-07 ENCOUNTER — Other Ambulatory Visit: Payer: Self-pay | Admitting: Podiatry

## 2021-04-07 ENCOUNTER — Other Ambulatory Visit: Payer: Self-pay | Admitting: Specialist

## 2021-04-07 DIAGNOSIS — B353 Tinea pedis: Secondary | ICD-10-CM

## 2021-04-07 DIAGNOSIS — E1151 Type 2 diabetes mellitus with diabetic peripheral angiopathy without gangrene: Secondary | ICD-10-CM

## 2021-04-09 ENCOUNTER — Encounter: Payer: Self-pay | Admitting: Specialist

## 2021-04-11 ENCOUNTER — Ambulatory Visit: Payer: 59 | Admitting: Podiatry

## 2021-04-11 DIAGNOSIS — I499 Cardiac arrhythmia, unspecified: Secondary | ICD-10-CM | POA: Insufficient documentation

## 2021-04-11 NOTE — Addendum Note (Signed)
Addended by: Christen Bame D on: 04/11/2021 04:15 PM ? ? Modules accepted: Orders ? ?

## 2021-04-11 NOTE — Progress Notes (Signed)
Late entry note ?While giving the patient her discharge paperwork there was discussion regarding her heart rate so it auscultated again.  It sounded like it was a regular rate but from auscultation sounded like an irregular rhythm.  Patient did acknowledge shortness of breath with exertion.  An EKG was performed showing normal sinus rhythm.  We will continue to monitor and she will come for evaluation or go to the emergency department if she becomes symptomatic. ?

## 2021-04-13 ENCOUNTER — Ambulatory Visit: Payer: 59 | Admitting: Psychology

## 2021-04-14 ENCOUNTER — Encounter: Payer: Self-pay | Admitting: Podiatry

## 2021-04-16 ENCOUNTER — Other Ambulatory Visit: Payer: Self-pay | Admitting: Podiatry

## 2021-04-16 ENCOUNTER — Encounter: Payer: Self-pay | Admitting: Family Medicine

## 2021-04-16 DIAGNOSIS — E1151 Type 2 diabetes mellitus with diabetic peripheral angiopathy without gangrene: Secondary | ICD-10-CM

## 2021-04-16 DIAGNOSIS — B353 Tinea pedis: Secondary | ICD-10-CM

## 2021-04-16 MED ORDER — TRELEGY ELLIPTA 100-62.5-25 MCG/ACT IN AEPB: 1.0000 | INHALATION_SPRAY | Freq: Every day | RESPIRATORY_TRACT | 3 refills | Status: DC

## 2021-04-16 NOTE — Telephone Encounter (Signed)
Please advise 

## 2021-04-18 ENCOUNTER — Other Ambulatory Visit: Payer: Self-pay | Admitting: *Deleted

## 2021-04-18 ENCOUNTER — Telehealth: Payer: Self-pay | Admitting: *Deleted

## 2021-04-18 MED ORDER — KETOCONAZOLE 2 % EX CREA: 1.0000 "application " | TOPICAL_CREAM | Freq: Every day | CUTANEOUS | 0 refills | Status: DC

## 2021-04-18 NOTE — Telephone Encounter (Signed)
Called patient and explained that a mupirocin ointment has never been prescribed but the ketoconazole 2% cream 2 months ago, apologized and said that is the one she needs a refill on, got confused with medications. Please advise. ?

## 2021-04-20 ENCOUNTER — Encounter: Payer: Self-pay | Admitting: Family Medicine

## 2021-04-20 ENCOUNTER — Other Ambulatory Visit: Payer: Self-pay | Admitting: Family Medicine

## 2021-04-20 ENCOUNTER — Ambulatory Visit (HOSPITAL_BASED_OUTPATIENT_CLINIC_OR_DEPARTMENT_OTHER): Payer: 59 | Admitting: General Surgery

## 2021-04-20 MED ORDER — DICLOFENAC SODIUM 1 % EX GEL: CUTANEOUS | 1 refills | Status: DC

## 2021-04-23 ENCOUNTER — Ambulatory Visit (INDEPENDENT_AMBULATORY_CARE_PROVIDER_SITE_OTHER): Payer: 59 | Admitting: Podiatry

## 2021-04-23 ENCOUNTER — Encounter: Payer: Self-pay | Admitting: Podiatry

## 2021-04-23 ENCOUNTER — Other Ambulatory Visit: Payer: Self-pay

## 2021-04-23 ENCOUNTER — Other Ambulatory Visit: Payer: Self-pay | Admitting: Urology

## 2021-04-23 DIAGNOSIS — E1142 Type 2 diabetes mellitus with diabetic polyneuropathy: Secondary | ICD-10-CM | POA: Diagnosis not present

## 2021-04-23 DIAGNOSIS — M76821 Posterior tibial tendinitis, right leg: Secondary | ICD-10-CM | POA: Diagnosis not present

## 2021-04-23 MED ORDER — TRIAMCINOLONE ACETONIDE 10 MG/ML IJ SUSP
10.0000 mg | Freq: Once | INTRAMUSCULAR | Status: AC
  Administered 2021-04-23: 10 mg

## 2021-04-23 NOTE — Progress Notes (Signed)
Subjective:  ? ?Patient ID: [name], female   DOB: 56 y.o.   MRN: [id number]  ? ?HPI ?Patient states she is getting pain on the inside of her right ankle and also is concerned about her long-term neuropathy and if anything can be done to help ? ? ?ROS ? ? ?   ?Objective:  ?Physical Exam  ?Neurovascular status intact with patient found to have long-term neuropathy bilateral diminishment sharp dull vibratory no history of falling and does take gabapentin and has discomfort in the medial ankle right around the posterior tibial tendon ? ?   ?Assessment:  ?Possibility for acute posterior tibial tendon right along with chronic neuropathy ? ?   ?Plan:  ?H&P reviewed condition do not have treatment currently for neuropathy besides her gabapentin she will continue that but I am at a try to help the acute inflammation I did do sterile prep and injected the posterior tibial tendon within the sheath 3 mg Dexasone Kenalog 5 mg Xylocaine and hopefully this will provide her with relief of her symptoms.  Continue routine care ?   ? ? ?

## 2021-04-24 ENCOUNTER — Encounter (HOSPITAL_BASED_OUTPATIENT_CLINIC_OR_DEPARTMENT_OTHER): Payer: Self-pay | Admitting: Urology

## 2021-04-24 ENCOUNTER — Other Ambulatory Visit: Payer: Self-pay

## 2021-04-24 NOTE — Progress Notes (Signed)
Spoke w/ via phone for pre-op interview--- [name] ?Lab needs dos---- ISTAT              ?Lab results------Current EKG in Epic dated 03/2021 ?COVID test -----patient states asymptomatic no test needed ?Arrive at -------0800 ?NPO after MN NO Solid Food.  ?Med rec completed ?Medications to take morning of surgery ----- Baclofen, Albuterol inhaler (bring), Cardizem, Gabapentin, Effexor, percocet, Prilosec, Atarax, Claritin and Robaxin. ?Diabetic medication ----- ?Patient instructed no nail polish to be worn day of surgery ?Patient instructed to bring photo id and insurance card day of surgery ?Patient aware to have Driver (ride ) / caregiver    [name] for 24 hours after surgery  ?Patient Special Instructions ----- ?Pre-Op special Istructions ----- ?Patient verbalized understanding of instructions that were given at this phone interview. ?Patient denies shortness of breath, chest pain, fever, cough at this phone interview.  ?

## 2021-05-01 ENCOUNTER — Ambulatory Visit (INDEPENDENT_AMBULATORY_CARE_PROVIDER_SITE_OTHER): Payer: 59 | Admitting: Family Medicine

## 2021-05-01 ENCOUNTER — Encounter: Payer: Self-pay | Admitting: Family Medicine

## 2021-05-01 VITALS — BP 130/72 | HR 88 | Ht 67.0 in | Wt 287.4 lb

## 2021-05-01 DIAGNOSIS — N1831 Chronic kidney disease, stage 3a: Secondary | ICD-10-CM | POA: Diagnosis not present

## 2021-05-01 DIAGNOSIS — Z794 Long term (current) use of insulin: Secondary | ICD-10-CM

## 2021-05-01 DIAGNOSIS — E1122 Type 2 diabetes mellitus with diabetic chronic kidney disease: Secondary | ICD-10-CM

## 2021-05-01 DIAGNOSIS — L989 Disorder of the skin and subcutaneous tissue, unspecified: Secondary | ICD-10-CM | POA: Diagnosis not present

## 2021-05-01 LAB — POCT GLYCOSYLATED HEMOGLOBIN (HGB A1C): HbA1c, POC (controlled diabetic range): 6.9 % (ref 0.0–7.0)

## 2021-05-01 NOTE — Progress Notes (Signed)
? ? ?  SUBJECTIVE:  ? ?CHIEF COMPLAINT / HPI:  ? ?Diabetes Check up  ?Patient believes that her diabetes is probably doing well because her diet has been going well.  She has been losing weight and is down to 287 pounds.  Her goal is 250 pounds so she can have a penectomy and then have hip surgery.  She is not currently on any medications for her diabetes since her gastric surgery. ? ?Head lesion  ?Last time I saw this patient she had a lesion on her head which was scabbed over and she was scratching.  She reports that the scab resolved and the wound is healed well but she still has a bump on her head and there is no hair growing there and she is concerned that no hair will ever grow back in that location.  Denies any drainage from the wound or continued pain. ?OBJECTIVE:  ? ?BP 130/72   Pulse 88   Ht '5\' 7"'$  (1.702 m)   Wt 287 lb 6.4 oz (130.4 kg)   LMP 04/10/2006   SpO2 96%   BMI 45.01 kg/m?   ?General: Pleasant 56 year old female in no acute distress ?Cardiac: Regular rate and rhythm, no murmurs appreciated ?Respiratory: Normal breathing, lungs clear to auscultation bilaterally ?Abdomen: Soft nontender, positive bowel sounds ?MSK: No gross abnormalities ?Derm: Patient with dime sized lesion on the top of her head.  It is nonerythematous, not warm, mobile consistent with cyst ? ? ?ASSESSMENT/PLAN:  ? ?Type 2 diabetes mellitus with stage 3 chronic kidney disease, with long-term current use of insulin ([place]) ?Patient is continue to lose weight since previous visit.  Hemoglobin A1c at last check was 6.7.  Hemoglobin A1c today is 6.9.  We will continue dietary changes and monitoring and repeat A1c in 3 months.  No additional medications at this time. ? ?Skin lesion of scalp ?Skin lesion consistent with cyst which seems to be resolving.  Does not appear infected at this time.  No erythema, drainage, warmth.  We will continue to monitor.  No intervention needed at this time. ?  ? ? ?[name], MD ?[place]  ? ?

## 2021-05-01 NOTE — Assessment & Plan Note (Signed)
Skin lesion consistent with cyst which seems to be resolving.  Does not appear infected at this time.  No erythema, drainage, warmth.  We will continue to monitor.  No intervention needed at this time. ?

## 2021-05-01 NOTE — Patient Instructions (Signed)
It was great seeing you today!  Your hemoglobin A1c was 6.9 today from 6.7 at previous check.  We are not can add any medications at this time.  Regarding the spot on your head I believe it came from a cyst and it looks to be healing well.  The swelling should resolve and hopefully your hair will grow back in that spot.  If you have any issues should be a message and I will get back to you as soon as I see it.  I hope you have a great day! ?

## 2021-05-01 NOTE — Assessment & Plan Note (Signed)
Patient is continue to lose weight since previous visit.  Hemoglobin A1c at last check was 6.7.  Hemoglobin A1c today is 6.9.  We will continue dietary changes and monitoring and repeat A1c in 3 months.  No additional medications at this time. ?

## 2021-05-01 NOTE — Anesthesia Preprocedure Evaluation (Addendum)
Anesthesia Evaluation  ?Patient identified by MRN, date of birth, ID band ?Patient awake ? ? ? ?Reviewed: ?Allergy & Precautions, NPO status , Patient's Chart, lab work & pertinent test results ? ?History of Anesthesia Complications ?Negative for: history of anesthetic complications ? ?Airway ?Mallampati: II ? ?TM Distance: >3 FB ?Neck ROM: Full ? ? ? Dental ? ?(+) Edentulous Upper, Missing,  ?  ?Pulmonary ?sleep apnea , COPD, Current Smoker,  ?  ?Pulmonary exam normal ? ? ? ? ? ? ? Cardiovascular ?hypertension, Pt. on medications and Pt. on home beta blockers ?+CHF  ?Normal cardiovascular exam ? ?TTE 2019: EF 55-60%, grade 1 DD, valves ok ?  ?Neuro/Psych ? Headaches, Anxiety Depression Bipolar Disorder   ? GI/Hepatic ?GERD  Medicated and Controlled,(+) Hepatitis -, B  ?Endo/Other  ?diabetes, Type 2, Insulin DependentMorbid obesity (BMI 45) ? Renal/GU ?Renal InsufficiencyRenal disease  ?negative genitourinary ?  ?Musculoskeletal ? ?(+) Arthritis ,  ? Abdominal ?  ?Peds ? Hematology ?negative hematology ROS ?(+)   ?Anesthesia Other Findings ?Day of surgery medications reviewed with patient. ? Reproductive/Obstetrics ?negative OB ROS ? ?  ? ? ? ? ? ? ? ? ? ? ? ? ? ?  ?  ? ? ? ? ? ? ? ?Anesthesia Physical ?Anesthesia Plan ? ?ASA: 3 ? ?Anesthesia Plan: General  ? ?Post-op Pain Management: Tylenol PO (pre-op)*  ? ?Induction: Intravenous ? ?PONV Risk Score and Plan: 2 and Treatment may vary due to age or medical condition, Midazolam, Ondansetron and Dexamethasone ? ?Airway Management Planned: LMA ? ?Additional Equipment: None ? ?Intra-op Plan:  ? ?Post-operative Plan: Extubation in OR ? ?Informed Consent: I have reviewed the patients History and Physical, chart, labs and discussed the procedure including the risks, benefits and alternatives for the proposed anesthesia with the patient or authorized representative who has indicated his/her understanding and acceptance.  ? ? ? ?Dental  advisory given ? ?Plan Discussed with: CRNA ? ?Anesthesia Plan Comments:   ? ? ? ? ? ?Anesthesia Quick Evaluation ? ?

## 2021-05-02 ENCOUNTER — Ambulatory Visit (HOSPITAL_BASED_OUTPATIENT_CLINIC_OR_DEPARTMENT_OTHER): Payer: 59 | Admitting: Anesthesiology

## 2021-05-02 ENCOUNTER — Other Ambulatory Visit: Payer: Self-pay

## 2021-05-02 ENCOUNTER — Encounter (HOSPITAL_BASED_OUTPATIENT_CLINIC_OR_DEPARTMENT_OTHER)
Admission: RE | Disposition: A | Discharge status: Home / Self Care | Payer: Self-pay | Source: Home / Self Care | Attending: Urology

## 2021-05-02 ENCOUNTER — Ambulatory Visit (HOSPITAL_BASED_OUTPATIENT_CLINIC_OR_DEPARTMENT_OTHER)
Admission: RE | Admit: 2021-05-02 | Discharge: 2021-05-02 | Disposition: A | Discharge status: Home / Self Care | Payer: 59 | Attending: Urology | Admitting: Urology

## 2021-05-02 ENCOUNTER — Encounter (HOSPITAL_BASED_OUTPATIENT_CLINIC_OR_DEPARTMENT_OTHER): Payer: Self-pay | Admitting: Urology

## 2021-05-02 DIAGNOSIS — N3941 Urge incontinence: Secondary | ICD-10-CM | POA: Diagnosis not present

## 2021-05-02 DIAGNOSIS — N183 Chronic kidney disease, stage 3 unspecified: Secondary | ICD-10-CM | POA: Diagnosis not present

## 2021-05-02 DIAGNOSIS — I130 Hypertensive heart and chronic kidney disease with heart failure and stage 1 through stage 4 chronic kidney disease, or unspecified chronic kidney disease: Secondary | ICD-10-CM | POA: Diagnosis not present

## 2021-05-02 DIAGNOSIS — N319 Neuromuscular dysfunction of bladder, unspecified: Secondary | ICD-10-CM

## 2021-05-02 DIAGNOSIS — K219 Gastro-esophageal reflux disease without esophagitis: Secondary | ICD-10-CM | POA: Insufficient documentation

## 2021-05-02 DIAGNOSIS — G4730 Sleep apnea, unspecified: Secondary | ICD-10-CM | POA: Diagnosis not present

## 2021-05-02 DIAGNOSIS — Z6841 Body Mass Index (BMI) 40.0 and over, adult: Secondary | ICD-10-CM | POA: Diagnosis not present

## 2021-05-02 DIAGNOSIS — Z794 Long term (current) use of insulin: Secondary | ICD-10-CM | POA: Insufficient documentation

## 2021-05-02 DIAGNOSIS — F17210 Nicotine dependence, cigarettes, uncomplicated: Secondary | ICD-10-CM | POA: Diagnosis not present

## 2021-05-02 DIAGNOSIS — I509 Heart failure, unspecified: Secondary | ICD-10-CM | POA: Diagnosis not present

## 2021-05-02 DIAGNOSIS — E119 Type 2 diabetes mellitus without complications: Secondary | ICD-10-CM | POA: Diagnosis not present

## 2021-05-02 HISTORY — PX: BOTOX INJECTION: SHX5754

## 2021-05-02 LAB — POCT I-STAT, CHEM 8
BUN: 21 mg/dL — ABNORMAL HIGH (ref 6–20)
Calcium, Ion: 1.23 mmol/L (ref 1.15–1.40)
Chloride: 101 mmol/L (ref 98–111)
Creatinine, Ser: 0.9 mg/dL (ref 0.44–1.00)
Glucose, Bld: 128 mg/dL — ABNORMAL HIGH (ref 70–99)
HCT: 55 % — ABNORMAL HIGH (ref 36.0–46.0)
Hemoglobin: 18.7 g/dL — ABNORMAL HIGH (ref 12.0–15.0)
Potassium: 3.5 mmol/L (ref 3.5–5.1)
Sodium: 142 mmol/L (ref 135–145)
TCO2: 28 mmol/L (ref 22–32)

## 2021-05-02 LAB — GLUCOSE, CAPILLARY: Glucose-Capillary: 143 mg/dL — ABNORMAL HIGH (ref 70–99)

## 2021-05-02 SURGERY — BOTOX INJECTION
Anesthesia: General | Site: Bladder

## 2021-05-02 MED ORDER — FENTANYL CITRATE (PF) 100 MCG/2ML IJ SOLN
INTRAMUSCULAR | Status: DC | PRN
  Administered 2021-05-02: 100 ug via INTRAVENOUS

## 2021-05-02 MED ORDER — CEFAZOLIN SODIUM-DEXTROSE 2-4 GM/100ML-% IV SOLN
2.0000 g | Freq: Once | INTRAVENOUS | Status: AC
  Administered 2021-05-02: 2 g via INTRAVENOUS

## 2021-05-02 MED ORDER — SODIUM CHLORIDE (PF) 0.9 % IJ SOLN
INTRAMUSCULAR | Status: DC | PRN
  Administered 2021-05-02: 20 mL

## 2021-05-02 MED ORDER — OXYCODONE HCL 5 MG PO TABS: 5.0000 mg | ORAL_TABLET | Freq: Once | ORAL | Status: DC | PRN

## 2021-05-02 MED ORDER — LIDOCAINE HCL (PF) 2 % IJ SOLN
INTRAMUSCULAR | Status: AC
  Filled 2021-05-02: qty 5

## 2021-05-02 MED ORDER — DROPERIDOL 2.5 MG/ML IJ SOLN: 0.6250 mg | Freq: Once | INTRAMUSCULAR | Status: DC | PRN

## 2021-05-02 MED ORDER — ONDANSETRON HCL 4 MG/2ML IJ SOLN
INTRAMUSCULAR | Status: AC
  Filled 2021-05-02: qty 2

## 2021-05-02 MED ORDER — LACTATED RINGERS IV SOLN: INTRAVENOUS | Status: DC

## 2021-05-02 MED ORDER — ACETAMINOPHEN 325 MG PO TABS
ORAL_TABLET | ORAL | Status: AC
  Filled 2021-05-02: qty 2

## 2021-05-02 MED ORDER — ACETAMINOPHEN 325 MG PO TABS
650.0000 mg | ORAL_TABLET | Freq: Once | ORAL | Status: AC
  Administered 2021-05-02: 650 mg via ORAL

## 2021-05-02 MED ORDER — PROPOFOL 10 MG/ML IV BOLUS
INTRAVENOUS | Status: AC
Start: 2021-05-02 — End: ?
  Filled 2021-05-02: qty 20

## 2021-05-02 MED ORDER — ONABOTULINUMTOXINA 100 UNITS IJ SOLR
INTRAMUSCULAR | Status: DC | PRN
  Administered 2021-05-02: 200 [IU] via INTRAMUSCULAR

## 2021-05-02 MED ORDER — ACETAMINOPHEN 500 MG PO TABS: 1000.0000 mg | ORAL_TABLET | Freq: Once | ORAL | Status: DC

## 2021-05-02 MED ORDER — PROPOFOL 10 MG/ML IV BOLUS
INTRAVENOUS | Status: DC | PRN
Start: 2021-05-02 — End: 2021-05-02
  Administered 2021-05-02: 200 mg via INTRAVENOUS

## 2021-05-02 MED ORDER — FENTANYL CITRATE (PF) 100 MCG/2ML IJ SOLN
INTRAMUSCULAR | Status: AC
  Filled 2021-05-02: qty 2

## 2021-05-02 MED ORDER — MIDAZOLAM HCL 5 MG/5ML IJ SOLN
INTRAMUSCULAR | Status: DC | PRN
Start: 2021-05-02 — End: 2021-05-02
  Administered 2021-05-02: 2 mg via INTRAVENOUS

## 2021-05-02 MED ORDER — DEXAMETHASONE SODIUM PHOSPHATE 10 MG/ML IJ SOLN
INTRAMUSCULAR | Status: DC | PRN
  Administered 2021-05-02: 5 mg via INTRAVENOUS

## 2021-05-02 MED ORDER — DEXAMETHASONE SODIUM PHOSPHATE 10 MG/ML IJ SOLN
INTRAMUSCULAR | Status: AC
  Filled 2021-05-02: qty 1

## 2021-05-02 MED ORDER — FENTANYL CITRATE (PF) 100 MCG/2ML IJ SOLN: 25.0000 ug | INTRAMUSCULAR | Status: DC | PRN

## 2021-05-02 MED ORDER — MIDAZOLAM HCL 2 MG/2ML IJ SOLN
INTRAMUSCULAR | Status: AC
Start: 2021-05-02 — End: ?
  Filled 2021-05-02: qty 2

## 2021-05-02 MED ORDER — OXYCODONE HCL 5 MG/5ML PO SOLN: 5.0000 mg | Freq: Once | ORAL | Status: DC | PRN

## 2021-05-02 MED ORDER — ONDANSETRON HCL 4 MG/2ML IJ SOLN
INTRAMUSCULAR | Status: DC | PRN
  Administered 2021-05-02: 4 mg via INTRAVENOUS

## 2021-05-02 MED ORDER — STERILE WATER FOR IRRIGATION IR SOLN
Status: DC | PRN
  Administered 2021-05-02: 1500 mL

## 2021-05-02 MED ORDER — CEPHALEXIN 500 MG PO CAPS: 500.0000 mg | ORAL_CAPSULE | Freq: Two times a day (BID) | ORAL | 0 refills | Status: AC

## 2021-05-02 MED ORDER — LIDOCAINE 2% (20 MG/ML) 5 ML SYRINGE
INTRAMUSCULAR | Status: DC | PRN
  Administered 2021-05-02: 100 mg via INTRAVENOUS

## 2021-05-02 MED ORDER — CEFAZOLIN SODIUM-DEXTROSE 2-4 GM/100ML-% IV SOLN
INTRAVENOUS | Status: AC
  Filled 2021-05-02: qty 100

## 2021-05-02 SURGICAL SUPPLY — 19 items
BAG DRAIN URO-CYSTO SKYTR STRL (DRAIN) ×2 IMPLANT
BAG DRN UROCATH (DRAIN) ×1
CATH ROBINSON RED A/P 14FR (CATHETERS) IMPLANT
CLOTH BEACON ORANGE TIMEOUT ST (SAFETY) ×2 IMPLANT
ELECT REM PT RETURN 9FT ADLT (ELECTROSURGICAL) ×2
ELECTRODE REM PT RTRN 9FT ADLT (ELECTROSURGICAL) ×1 IMPLANT
GLOVE SURG ENC MOIS LTX SZ7.5 (GLOVE) ×2 IMPLANT
GOWN STRL REUS W/TWL LRG LVL3 (GOWN DISPOSABLE) ×5 IMPLANT
KIT TURNOVER CYSTO (KITS) ×2 IMPLANT
MANIFOLD NEPTUNE II (INSTRUMENTS) ×2 IMPLANT
NDL ASPIRATION 22 (NEEDLE) IMPLANT
NDL SAFETY ECLIPSE 18X1.5 (NEEDLE) IMPLANT
NEEDLE ASPIRATION 22 (NEEDLE) IMPLANT
NEEDLE HYPO 18GX1.5 SHARP (NEEDLE) ×2
PACK CYSTO (CUSTOM PROCEDURE TRAY) ×2 IMPLANT
SYR 20ML LL LF (SYRINGE) IMPLANT
SYR CONTROL 10ML LL (SYRINGE) ×1 IMPLANT
TUBE CONNECTING 12X1/4 (SUCTIONS) ×2 IMPLANT
WATER STERILE IRR 3000ML UROMA (IV SOLUTION) ×2 IMPLANT

## 2021-05-02 NOTE — Anesthesia Procedure Notes (Signed)
Procedure Name: LMA Insertion ?Date/Time: 05/02/2021 10:06 AM ?Performed by: Rogers Blocker, CRNA ?Pre-anesthesia Checklist: Patient identified, Emergency Drugs available, Suction available and Patient being monitored ?Patient Re-evaluated:Patient Re-evaluated prior to induction ?Oxygen Delivery Method: Circle System Utilized ?Preoxygenation: Pre-oxygenation with 100% oxygen ?Induction Type: IV induction ?Ventilation: Mask ventilation without difficulty ?LMA: LMA inserted ?LMA Size: 4.0 ?Number of attempts: 1 ?Placement Confirmation: positive ETCO2 ?Tube secured with: Tape ?Dental Injury: Teeth and Oropharynx as per pre-operative assessment  ? ? ? ? ?

## 2021-05-02 NOTE — Anesthesia Postprocedure Evaluation (Signed)
Anesthesia Post Note ? ?Patient: [name] ? ?Procedure(s) Performed: BOTOX INJECTION WITH CYSTOSCOPY (Bladder) ? ?  ? ?Patient location during evaluation: PACU ?Anesthesia Type: General ?Level of consciousness: awake and alert ?Pain management: pain level controlled ?Vital Signs Assessment: post-procedure vital signs reviewed and stable ?Respiratory status: spontaneous breathing, nonlabored ventilation and respiratory function stable ?Cardiovascular status: blood pressure returned to baseline ?Postop Assessment: no apparent nausea or vomiting ?Anesthetic complications: no ? ? ?No notable events documented. ? ?Last Vitals:  ?Vitals:  ? 05/02/21 1054 05/02/21 1100  ?BP: (!) 165/94 (!) 173/93  ?Pulse: 92 84  ?Resp: (!) 22 (!) 22  ?Temp:    ?SpO2: 92% 92%  ?  ?Last Pain:  ?Vitals:  ? 05/02/21 1031  ?TempSrc:   ?PainSc: 0-No pain  ? ? ?  ?  ?  ?  ?  ?  ? ?Marthenia Rolling ? ? ? ? ?

## 2021-05-02 NOTE — H&P (Signed)
Urology Preoperative H&P  ? ?Chief Complaint: Urge incontinence  ? ?History of Present Illness: [name] is a 56 y.o. female with a history of cauda equina syndrome, a neurogenic bladder, urge incontinence and nocturnal enuresis. S/p Botox intravesical injections on 07/27/19, which significantly improved her incontinence.  She is back today for a repeat Botox injection.  ? ?Past Medical History:  ?Diagnosis Date  ? Alcoholism ([place])   ? Anxiety   ? Arthritis 04/10/2011  ? hips, shoulders, back  ? Asthma   ? Bipolar disorder ([place])   ? Cardiomegaly   ? Carpal tunnel syndrome, bilateral   ? Cervical cancer ([place]) 1993  ? cervical, no treatment done, went away per pt  ? Cervical dysplasia or atypia 04/10/2011  ? '93- once dx.-got pregnant-no intervention, then postpartum, no dysplasia found  ? CHF (congestive heart failure) ([place])   ? no cardiologist 2014 dx, none now  ? Chronic hypoxemic respiratory failure ([facility])   ? Chronic kidney disease   ? stage 3 kidney disease  ? Condyloma - gluteal cleft 04/09/2011  ? Removed by general surgery. Pathology showed Condyloma, gluteal CONDYLOMA ACUMINATUM.   ? COPD (chronic obstructive pulmonary disease) ([place])   ? Depression   ? Diabetes mellitus without complication ([place])   ? no meds today  ? Dyspnea   ? with actity, sitting  ? Fall 12/16/2016  ? Fibrocystic breast disease   ? GERD (gastroesophageal reflux disease)   ? Grade I diastolic dysfunction 17/79/3903  ? Noted on ECHO  ? Hepatitis   ? hep B-count is low at present,doesn't register  ? History of PSVT (paroxysmal supraventricular tachycardia)   ? Hypercalcemia   ? Hyperlipidemia   ? Hypertension   ? Incomplete left bundle branch block (LBBB) 09/24/2018  ? Noted on EKG  ? Lung nodule 11/2017  ? right and left lung  ? Migraine headache   ? none recent  ? Morbid obesity ([place]) 03/27/2006  ? Neuropathy   ? Pneumonia   ? walking pneu 15 yrs. ago  ? Skin lesion 03/15/2011  ? In gluteal crease now s/p removal by Dr. [name] of  General Surgery on 3/12. Path shows condyloma.     ? Sleep apnea 04/10/2011  ? does not use after loss  ? Trigger finger   ? left third  ? Urge incontinence of urine   ? ? ?Past Surgical History:  ?Procedure Laterality Date  ? ANAL FISTULECTOMY  04/17/2011  ? Procedure: FISTULECTOMY ANAL;  Surgeon: Imogene Burn. [name], MD;  Location: [facility];  Service: General;  Laterality: N/A;  Excision of Condyloma Gluteal Cleft   ? ANAL RECTAL MANOMETRY N/A 03/26/2019  ? Procedure: ANO RECTAL MANOMETRY;  Surgeon: Thornton Park, MD;  Location: [facility];  Service: Gastroenterology;  Laterality: N/A;  ? BACK SURGERY  2009  ? pt thinks it was an "interbody fusion"  ? BIOPSY  03/09/2019  ? Procedure: BIOPSY;  Surgeon: Thornton Park, MD;  Location: [facility];  Service: Gastroenterology;;  ? BOTOX INJECTION N/A 12/29/2018  ? Procedure: BOTOX INJECTION(100 UNITS)  WITH CYSTOSCOPY;  Surgeon: Ceasar Mons, MD;  [facility];  Service: Urology;  Laterality: N/A;  ? BOTOX INJECTION N/A 07/27/2019  ? Procedure: BOTOX INJECTION WITH CYSTOSCOPY;  Surgeon: Ceasar Mons, MD;  [facility]: [facility];  Service: Urology;  Laterality: N/A;  ONLY NEEDS 30 MIN  ? BREAST CYST EXCISION    ? bilateral breast, 3 cysts removed from each breast  ? BREAST EXCISIONAL  BIOPSY Right   ? x 3  ? BREAST EXCISIONAL BIOPSY Left   ? x 3  ? CARPAL TUNNEL RELEASE Bilateral   ? Cervical biospy    ? CERVICAL FUSION  04/10/2011  ? x3- cervical fusion with plating and screws-Dr. [name]  ? COLONOSCOPY WITH PROPOFOL N/A 07/06/2015  ? Procedure: COLONOSCOPY WITH PROPOFOL;  Surgeon: [name], MD;  [facility]: [facility];  Service: Endoscopy;  Laterality: N/A;  ? COLONOSCOPY WITH PROPOFOL N/A 03/09/2019  ? Procedure: COLONOSCOPY WITH PROPOFOL;  Surgeon: Thornton Park, MD;  [facility];  Service: Gastroenterology;  Laterality: N/A;  ? DOPPLER ECHOCARDIOGRAPHY  04/06/2012  ? AT [place] 55-60%  ? FOOT SURGERY Left 2018  ?  FRACTURE SURGERY    ? little toe left foot  ? I & D EXTREMITY Left 12/18/2016  ? Procedure: IRRIGATION AND DEBRIDEMENT LEFT FOOT, CLOSURE;  Surgeon: [name], MD;  Location: [facility];  Service: Orthopedics;  Laterality: Left;  ? INJECTION KNEE Right 10/03/2020  ? Procedure: KNEE INJECTION;  Surgeon: Jessy Oto, MD;  Location: [facility];  Service: Orthopedics;  Laterality: Right;  ? KNEE ARTHROSCOPY WITH MEDIAL MENISECTOMY Right 03/21/2021  ? Procedure: RIGHT KNEE ARTHROSCOPY WITH MEDIAL AND PARTIAL LATERAL MENISCECTOMY;  Surgeon: [name], MD;  Location: [facility];  Service: Orthopedics;  Laterality: Right;  ? LAPAROSCOPIC GASTRIC SLEEVE RESECTION N/A 05/08/2020  ? Procedure: LAPAROSCOPIC GASTRIC SLEEVE RESECTION;  Surgeon: Kieth Brightly, Arta Bruce, MD;  Location: [facility];  Service: General;  Laterality: N/A;  ? [place] SURGERY  04/10/2011  ? x5-Lumbar fusion-retained hardware.(Dr. Louanne Skye)  ? LUMBAR LAMINECTOMY N/A 10/03/2020  ? Procedure: THORACOLUMBAR LAMINECTOMIES THORACIC ELEVEN-TWELVE, THORACIC TWELVE-LUMBAR ONE  AND LUMBAT ONE-TWO;  Surgeon: Jessy Oto, MD;  Location: [facility];  Service: Orthopedics;  Laterality: N/A;  ? MULTIPLE TOOTH EXTRACTIONS    ? POLYPECTOMY  03/09/2019  ? Procedure: POLYPECTOMY;  Surgeon: Thornton Park, MD;  Location: [facility];  Service: Gastroenterology;;  ? SHOULDER ARTHROSCOPY WITH ROTATOR CUFF REPAIR AND SUBACROMIAL DECOMPRESSION Left 05/07/2019  ? Procedure: LEFT SHOULDER ARTHROSCOPY WITH DEBRIDEMENT, DISTAL CLAVICLE EXCISION,  SUBACROMIAL DECOMPRESSION AND POSSIBLE ROTATOR CUFF REPAIR;  Surgeon: [name], MD;  Location: [facility];  Service: Orthopedics;  Laterality: Left;  ? TEE WITHOUT CARDIOVERSION N/A 04/06/2012  ? Procedure: TRANSESOPHAGEAL ECHOCARDIOGRAM (TEE);  Surgeon: [name], MD;  Location: [facility];  Service: Cardiovascular;  Laterality: N/A;  ? TRIGGER FINGER RELEASE Right   ? middle finger  ? TRIGGER FINGER RELEASE Left  06/28/2016  ? Procedure: RELEASE TRIGGER FINGER LEFT 3RD FINGER;  Surgeon: [name], MD;  Location: [place];  Service: Orthopedics;  Laterality: Left;  ? TRIGGER FINGER RELEASE Right 06/12/2016  ? Procedure: RIGHT INDEX FINGER TRIGGER RELEASE;  Surgeon: [name], MD;  Location: [place];  Service: Orthopedics;  Laterality: Right;  ? TRIGGER FINGER RELEASE Right 01/19/2018  ? Procedure: RIGHT RING FINGER TRIGGER FINGER RELEASE;  Surgeon: [name], MD;  Location: [facility];  Service: Orthopedics;  Laterality: Right;  ? TRIGGER FINGER RELEASE Left 05/07/2019  ? Procedure: RELEASE TRIGGER FINGER LEFT INDEX FINGER;  Surgeon: [name], MD;  Location: [facility];  Service: Orthopedics;  Laterality: Left;  ? UPPER GI ENDOSCOPY N/A 05/08/2020  ? Procedure: UPPER GI ENDOSCOPY;  Surgeon: Kinsinger, Arta Bruce, MD;  Location: [facility];  Service: General;  Laterality: N/A;  ? ? ?Allergies:  ?Allergies  ?Allergen Reactions  ? Aspirin Other (  See Comments)  ?  Kidney disease  ? Methadone Hcl Other (See Comments)  ?   "blacked out" in 1990's  ? Corticosteroids Other (See Comments)  ?  Hyperglycemia ?  ? Levofloxacin Itching  ? ? ?Family History  ?Problem Relation Age of Onset  ? Pulmonary embolism Mother   ? Stroke Father   ? Alcohol abuse Father   ? Pneumonia Father   ? Hypertension Father   ? Breast cancer Maternal Aunt   ? Hypertension Other   ? Diabetes Other   ?     Aunts and cousins  ? Asthma Sister   ? Depression Sister   ? Arthritis Sister   ? Sickle cell anemia Son   ? ? ?Social History:  reports that she has been smoking cigarettes. She started smoking about 44 years ago. She has a 8.00 pack-year smoking history. She has never used smokeless tobacco. She reports that she does not currently use drugs after having used the following drugs: Marijuana and "Crack" cocaine. She reports that she does not drink alcohol. ? ?ROS: ?A complete review of systems was performed.  All systems are negative except for pertinent findings  as noted. ? ?Physical Exam:  ?Vital signs in last 24 hours: ?Temp:  [98.4 ?F (36.9 ?C)] 98.4 ?F (36.9 ?C) (04/05 0805) ?Pulse Rate:  [94] 94 (04/05 0805) ?Resp:  [18] 18 (04/05 0805) ?BP: (134)/(90) 134/90 (04/05

## 2021-05-02 NOTE — Op Note (Signed)
Operative Note ? ?Preoperative diagnosis:  ?1.  Neurogenic bladder ?2.  Refractory urge incontinence ? ?Postoperative diagnosis: ?1.  Neurogenic bladder ?2.  Refractory urge incontinence ? ?Procedure(s): ?1.  Cystoscopy with injection of 200 units of intravesical Botox ? ?Surgeon: Ellison Hughs, MD ? ?Assistants:  None ? ?Anesthesia:  General ? ?Complications:  None ? ?EBL: Less than 5 mL ? ?Specimens: ?1.  None ? ?Drains/Catheters: ?1.  None ? ?Intraoperative findings:   ?No urethral or intravesical lesions were identified ? ?Indication:  [name] is a 56 y.o. female with history of cauda equina syndrome with a neurogenic bladder and refractory urge incontinence.  She has underwent intravesical Botox injections in the past with significant improvement in her incontinence.  She is here today for repeat injections.  She has been consented for the above procedures, voices understanding and wishes to proceed. ? ?Description of procedure: ? ?After informed consent was obtained, the patient was brought to the operating room and general LMA anesthesia was administered. The patient was then placed in the dorsolithotomy position and prepped and draped in the usual sterile fashion. A timeout was performed. A 23 French rigid cystoscope was then inserted into the urethral meatus and advanced into the bladder under direct vision. A complete bladder survey revealed no intravesical pathology. ?  ?I then injected a total of 200 units of Botox diluted in 20 mL of saline in 0.5 mL aliquots throughout the detrusor musculature, and a grid like fashion.  There was no evidence of bleeding following the injections.  The patient's bladder was then partially drained.  She tolerated the procedure well and was transferred to the postanesthesia in stable condition. ? ? ?Plan: Discharge home ? ?

## 2021-05-02 NOTE — Discharge Instructions (Addendum)
?  Post Anesthesia Home Care Instructions ? ?Activity: ?Get plenty of rest for the remainder of the day. A responsible individual must stay with you for 24 hours following the procedure.  ?For the next 24 hours, DO NOT: ?-Drive a car ?-Paediatric nurse ?-Drink alcoholic beverages ?-Take any medication unless instructed by your physician ?-Make any legal decisions or sign important papers. ? ?Meals: ?Start with liquid foods such as gelatin or soup. Progress to regular foods as tolerated. Avoid greasy, spicy, heavy foods. If nausea and/or vomiting occur, drink only clear liquids until the nausea and/or vomiting subsides. Call your physician if vomiting continues. ? ?Special Instructions/Symptoms: ?Your throat may feel dry or sore from the anesthesia or the breathing tube placed in your throat during surgery. If this causes discomfort, gargle with warm salt water. The discomfort should disappear within 24 hours. ? ?No acetaminophen/Tylenol until after 3:00 pm today if needed. ? ?    ?

## 2021-05-02 NOTE — Transfer of Care (Signed)
Immediate Anesthesia Transfer of Care Note ? ?Patient: [name] ? ?Procedure(s) Performed: BOTOX INJECTION WITH CYSTOSCOPY (Bladder) ? ?Patient Location: PACU ? ?Anesthesia Type:General ? ?Level of Consciousness: awake, alert , oriented and patient cooperative ? ?Airway & Oxygen Therapy: Patient Spontanous Breathing and Patient connected to face mask oxygen ? ?Post-op Assessment: Report given to RN and Post -op Vital signs reviewed and stable ? ?Post vital signs: Reviewed and stable ? ?Last Vitals:  ?Vitals Value Taken Time  ?BP 151/81 05/02/21 1031  ?Temp 36.7 ?C 05/02/21 1031  ?Pulse 90 05/02/21 1034  ?Resp 26 05/02/21 1034  ?SpO2 95 % 05/02/21 1034  ?Vitals shown include unvalidated device data. ? ?Last Pain:  ?Vitals:  ? 05/02/21 0805  ?TempSrc: Oral  ?PainSc: 10-Worst pain ever  ?   ? ?Patients Stated Pain Goal: 5 (05/02/21 0805) ? ?Complications: No notable events documented. ?

## 2021-05-03 ENCOUNTER — Encounter (HOSPITAL_BASED_OUTPATIENT_CLINIC_OR_DEPARTMENT_OTHER): Payer: Self-pay | Admitting: Urology

## 2021-05-03 ENCOUNTER — Ambulatory Visit (INDEPENDENT_AMBULATORY_CARE_PROVIDER_SITE_OTHER): Payer: 59 | Admitting: Orthopaedic Surgery

## 2021-05-03 ENCOUNTER — Encounter: Payer: 59 | Attending: General Surgery | Admitting: Skilled Nursing Facility1

## 2021-05-03 ENCOUNTER — Encounter: Payer: Self-pay | Admitting: Family Medicine

## 2021-05-03 DIAGNOSIS — Z6841 Body Mass Index (BMI) 40.0 and over, adult: Secondary | ICD-10-CM | POA: Insufficient documentation

## 2021-05-03 DIAGNOSIS — M75121 Complete rotator cuff tear or rupture of right shoulder, not specified as traumatic: Secondary | ICD-10-CM | POA: Diagnosis not present

## 2021-05-03 DIAGNOSIS — M7541 Impingement syndrome of right shoulder: Secondary | ICD-10-CM | POA: Diagnosis not present

## 2021-05-03 DIAGNOSIS — E1169 Type 2 diabetes mellitus with other specified complication: Secondary | ICD-10-CM | POA: Insufficient documentation

## 2021-05-03 DIAGNOSIS — N184 Chronic kidney disease, stage 4 (severe): Secondary | ICD-10-CM | POA: Insufficient documentation

## 2021-05-03 DIAGNOSIS — M67921 Unspecified disorder of synovium and tendon, right upper arm: Secondary | ICD-10-CM | POA: Diagnosis not present

## 2021-05-03 DIAGNOSIS — M19011 Primary osteoarthritis, right shoulder: Secondary | ICD-10-CM

## 2021-05-03 NOTE — Progress Notes (Signed)
? ?Office Visit Note ?  ?Patient: [name]           ?Date of Birth: 1965-02-12           ?MRN: [id number] ?Visit Date: 05/03/2021 ?             ?Requested by: Mary Shave, MD ?[address] ?[name],  [place] [address] ?PCP: Mary Shave, MD ? ? ?Assessment & Plan: ?Visit Diagnoses:  ?1. Nontraumatic complete tear of right rotator cuff   ?2. Arthritis of right acromioclavicular joint   ?3. Impingement syndrome of right shoulder   ?4. Biceps tendinopathy of right upper extremity   ? ? ?Plan: [name] has recovered from the right knee surgery.  She is doing well overall.  She is ready to undergo rotator cuff repair of the right shoulder at this point.  Plan is for rotator cuff repair, biceps tenotomy, distal clavicle excision, subacromial decompression.  Risk benefits rehab recovery prognosis reviewed with the patient detail.  Questions encouraged and answered. ? ?Follow-Up Instructions: No follow-ups on file.  ? ?Orders:  ?No orders of the defined types were placed in this encounter. ? ?No orders of the defined types were placed in this encounter. ? ? ? ? Procedures: ?No procedures performed ? ? ?Clinical Data: ?No additional findings. ? ? ?Subjective: ?Chief Complaint  ?Patient presents with  ? Right Knee - Follow-up  ?  Right knee arthroscopy 03/21/2021  ? ? ?HPI ? ?[name] returns today to follow-up for right knee scope and right shoulder pain and rotator cuff tear found on recent MRI.  She has no complaints regarding the right knee and has essentially fully recovered.  She is functional now.  She would like the shoulder to be addressed at this time. ? ?Review of Systems  ?Constitutional: Negative.   ?HENT: Negative.    ?Eyes: Negative.   ?Respiratory: Negative.    ?Cardiovascular: Negative.   ?Endocrine: Negative.   ?Musculoskeletal: Negative.   ?Neurological: Negative.   ?Hematological: Negative.   ?Psychiatric/Behavioral: Negative.    ?All other systems reviewed and are  negative. ? ? ?Objective: ?Vital Signs: LMP 04/10/2006  ? ?Physical Exam ?Vitals and nursing note reviewed.  ?Constitutional:   ?   Appearance: She is well-developed.  ?Pulmonary:  ?   Effort: Pulmonary effort is normal.  ?Skin: ?   General: Skin is warm.  ?   Capillary Refill: Capillary refill takes less than 2 seconds.  ?Neurological:  ?   Mental Status: She is alert and oriented to person, place, and time.  ?Psychiatric:     ?   Behavior: Behavior normal.     ?   Thought Content: Thought content normal.     ?   Judgment: Judgment normal.  ? ? ?Ortho Exam ? ?Examination of the right shoulder shows pain and weakness with manual muscle testing of the superior cuff.  Pain with testing of the biceps tendon.  Pain with impingement testing.  Range of motion is preserved. ? ?Examination of right knee shows fully healed surgical scars.  She has regained full range of motion. ? ?Specialty Comments:  ?No specialty comments available. ? ?Imaging: ?No results found. ? ? ?PMFS History: ?Patient Active Problem List  ? Diagnosis Date Noted  ? Biceps tendinopathy of right upper extremity 05/03/2021  ? Irregular heartbeat 04/11/2021  ? Neuropathic pain of foot 03/28/2021  ? Chronic congestion of paranasal sinus 03/28/2021  ? Impingement syndrome of right shoulder 03/08/2021  ? Arthritis of right acromioclavicular  joint 03/08/2021  ? Acute medial meniscus tear of right knee 03/08/2021  ? Acute lateral meniscus tear of right knee 03/08/2021  ? Spinal stenosis of thoracic region with radiculopathy   ? Bilateral leg numbness   ? Spinal stenosis of lumbar region with neurogenic claudication   ? Unilateral primary osteoarthritis, right knee   ? Impaired ambulation 01/10/2021  ? Status post lumbar laminectomy 10/03/2020  ? Cubital tunnel syndrome on left 05/02/2020  ? Vaginal discharge 01/20/2020  ? Prolapse of anterior vaginal wall 11/10/2019  ? Skin ulcer of left thigh, limited to breakdown of skin ([name]) 09/09/2019  ? Ulnar nerve  compression 08/06/2019  ? Diabetic neuropathy associated with type 2 diabetes mellitus ([place]) 08/06/2019  ? Body mass index 50.0-59.9, adult ([place]) 06/12/2019  ? Impingement syndrome of left shoulder 05/07/2019  ? Polyp of colon   ? Polyp of ascending colon   ? Rectal discomfort 02/12/2019  ? Osteoarthritis of right hip 02/12/2019  ? Nontraumatic tear of left supraspinatus tendon 12/30/2018  ? Gout 12/17/2018  ? Nontraumatic complete tear of right rotator cuff 12/08/2018  ? Rotator cuff tear, right 12/01/2018  ? Tobacco abuse 08/21/2018  ? Right groin pain 07/20/2018  ? Depressed mood 07/13/2018  ? Paroxysmal SVT (supraventricular tachycardia) ([place]) 03/20/2018  ? Pure hypercholesterolemia 03/20/2018  ? Muscle cramping 02/25/2018  ? Nodule of upper lobe of right lung 02/13/2018  ? Falls, subsequent encounter 01/30/2018  ? Pain in left foot 11/04/2017  ? Essential hypertension 10/27/2017  ? Solitary pulmonary nodule 10/07/2017  ? Restrictive lung disease secondary to obesity 10/01/2017  ? Polycythemia, secondary 10/01/2017  ? Chronic viral hepatitis B without delta-agent ([place]) 07/08/2017  ? COPD (chronic obstructive pulmonary disease) with chronic bronchitis ([place]) 06/27/2017  ? Chronic kidney disease (CKD), stage IV (severe) ([place]) 01/14/2017  ? Decubitus ulcer 12/13/2016  ? Type 2 diabetes mellitus with stage 3 chronic kidney disease, with long-term current use of insulin ([place]) 12/12/2016  ? Urge incontinence of urine 12/05/2016  ? Trigger finger, left index finger 07/15/2016  ? Back pain 04/07/2014  ? Morbid obesity ([place]) 10/02/2012  ? Chronic diastolic CHF (congestive heart failure) ([place]) 04/07/2012  ? Skin lesion of scalp 03/15/2011  ? GERD 01/26/2010  ? Obstructive sleep apnea treated with BiPAP 02/03/2008  ? FIBROCYSTIC BREAST DISEASE 10/28/2006  ? Hyperlipidemia 03/27/2006  ? Obesity hypoventilation syndrome ([place]) 03/27/2006  ? Depression with anxiety 03/27/2006  ? ?Past Medical History:  ?Diagnosis Date  ?  Alcoholism ([place])   ? Anxiety   ? Arthritis 04/10/2011  ? hips, shoulders, back  ? Asthma   ? Bipolar disorder ([place])   ? Cardiomegaly   ? Carpal tunnel syndrome, bilateral   ? Cervical cancer ([place]) 1993  ? cervical, no treatment done, went away per pt  ? Cervical dysplasia or atypia 04/10/2011  ? '93- once dx.-got pregnant-no intervention, then postpartum, no dysplasia found  ? CHF (congestive heart failure) ([employer])   ? no cardiologist 2014 dx, none now  ? Chronic hypoxemic respiratory failure ([facility])   ? Chronic kidney disease   ? stage 3 kidney disease  ? Condyloma - gluteal cleft 04/09/2011  ? Removed by general surgery. Pathology showed Condyloma, gluteal CONDYLOMA ACUMINATUM.   ? COPD (chronic obstructive pulmonary disease) ([place])   ? Depression   ? Diabetes mellitus without complication ([place])   ? no meds today  ? Dyspnea   ? with actity, sitting  ? Fall 12/16/2016  ? Fibrocystic breast disease   ?  GERD (gastroesophageal reflux disease)   ? Grade I diastolic dysfunction 29/47/6546  ? Noted on ECHO  ? Hepatitis   ? hep B-count is low at present,doesn't register  ? History of PSVT (paroxysmal supraventricular tachycardia)   ? Hypercalcemia   ? Hyperlipidemia   ? Hypertension   ? Incomplete left bundle branch block (LBBB) 09/24/2018  ? Noted on EKG  ? Lung nodule 11/2017  ? right and left lung  ? Migraine headache   ? none recent  ? Morbid obesity ([place]) 03/27/2006  ? Neuropathy   ? Pneumonia   ? walking pneu 15 yrs. ago  ? Skin lesion 03/15/2011  ? In gluteal crease now s/p removal by Dr. [name] of General Surgery on 3/12. Path shows condyloma.     ? Sleep apnea 04/10/2011  ? does not use after loss  ? Trigger finger   ? left third  ? Urge incontinence of urine   ?  ?Family History  ?Problem Relation Age of Onset  ? Pulmonary embolism Mother   ? Stroke Father   ? Alcohol abuse Father   ? Pneumonia Father   ? Hypertension Father   ? Breast cancer Maternal Aunt   ? Hypertension Other   ? Diabetes Other   ?     Aunts and  cousins  ? Asthma Sister   ? Depression Sister   ? Arthritis Sister   ? Sickle cell anemia Son   ?  ?Past Surgical History:  ?Procedure Laterality Date  ? ANAL FISTULECTOMY  04/17/2011  ? Procedure: FISTULECTOMY ANAL;  Surgeon: Imogene Burn. Tsuei

## 2021-05-03 NOTE — Progress Notes (Signed)
Bariatric Nutrition Follow-Up Visit ?Medical Nutrition Therapy  ? ?Surgery Date: 05/08/2020 ?Sleeve gastrectomy  ? ?NUTRITION ASSESSMENT ?  ? ?Anthropometrics  ?Start weight at [facility]: 405.6 lbs (date: 07/08/2018) ?Today's weight: 286.6 pounds ? ?Body Composition Scale 07/03/2020 05/03/2021  ?Weight  lbs 329.8 286.6  ?Total Body Fat  % 49.6 46.7  ?   Visceral Fat 22 18  ?Fat-Free Mass  % 50.3 53.2  ?   Total Body Water  % 39.6 41.1  ?   Muscle-Mass  lbs 35 34.4  ?BMI 48.5 42.1  ?Body Fat Displacement ---   ?      Torso  lbs 101.6 83.2  ?      Left Leg  lbs 20.3 16.6  ?      Right Leg  lbs 20.3 16.6  ?      Left Arm  lbs 10.1 8.3  ?      Right Arm  lbs 10.1 8.3  ? ?Clinical  ?Medical hx: HTN, Sleep apnea, GERD, DM, CKD stage 4 labs currently indicate healthy renal function ?Medications: no longer taking insulin unless her sugar goes over 200 so stopped the order  ?Labs: A1C 6.9 ?  ?Lifestyle & Dietary Hx ? ?Pt states she just got Botox in her bladder yesterday. ?Pt states she is really proud of herself for still losing the weight even when struggling with depression and not being able to leave the house. ?Pt states her goal is to be 250 pounds. Pt state she needs to have a paniculectomy to have her hip replacement. Pt sates she needs rotator cuff surgery as well. ?Pt states 2025 is going to be her year to travel because all of her surgeries will be done. ?Pt states she was constipated but since adding her stool softener back in she is regular.  ?Pt states blood sugars: 109, 134, 85 ? ?Pt states she wakes at 2-3:30am until 6am dozing here and there due to being up every 1-2 hours to urinate but since the injection it will last 18 months of not having to urinate in the night.  ?Pt states she has extreme back pain waiting for her nerves to go back to sleep.  ?Pt states she is scheduled to have labs drawn April 20th. ?Pt is doing fantastic ? ? ?Estimated daily fluid intake: 64 oz ?Estimated daily protein intake: 60  g ?Supplements: calcium and multi vitamin ?Current average weekly physical activity: ADLs due to back and other pain  ? ?24-Hr Dietary Recall: mixed greens 2-3 times a week, sometimes purple cabbage, broccoli and cheese often; fruit once a week so probably 2-3 servings of fruit per day ?First Meal: protein shake and [place] egg + salad: romaine, cucumbers, banana, pineapple, [place] + ranch ?Snack: ?Second Meal: other half of salad + chicken wing ?Snack: : strawberries or cantelope  (when watching her show) ?Third Meal: steak or beef neck bone + onion + peppers + cabbage + olive oil ?Snack: protein shake to take medicine  ?Beverages: water, protein water, sugar free twist, almond milk ? ?Post-Op Goals/ Signs/ Symptoms ?Using straws: no ?Drinking while eating: no ?Chewing/swallowing difficulties: no ?Changes in vision: no ?Changes to mood/headaches: no ?Hair loss/changes to skin/nails: no ?Difficulty focusing/concentrating: no ?Sweating: no ?Dizziness/lightheadedness: no ?Palpitations: no  ?Carbonated/caffeinated beverages: no ?N/V/D/C/Gas: yes constipation but states it is getting better ?Abdominal pain: no ?Dumping syndrome: no ? ?  ?NUTRITION DIAGNOSIS  ?Overweight/obesity ([place]-3.3) related to past poor dietary habits and physical inactivity as evidenced by completed bariatric  surgery and following dietary guidelines for continued weight loss and healthy nutrition status. ?  ?  ?NUTRITION INTERVENTION (Continued) ?Nutrition counseling (C-1) and education (E-2) to facilitate bariatric surgery goals, including: ?The importance of consuming adequate calories as well as certain nutrients daily due to the body's need for essential vitamins, minerals, and fats ?The importance of daily physical activity and to reach a goal of at least 150 minutes of moderate to vigorous physical activity weekly (or as directed by their physician) due to benefits such as increased musculature and improved lab values ?The importance of  intuitive eating specifically learning hunger-satiety cues and understanding the importance of learning a new body: The importance of mindful eating to avoid grazing behaviors  ?Encouraged patient to honor their body's internal hunger and fullness cues.  Throughout the day, check in mentally and rate hunger. Stop eating when satisfied not full regardless of how much food is left on the plate.  Get more if still hungry 20-30 minutes later.  The key is to honor satisfaction so throughout the meal, rate fullness factor and stop when comfortably satisfied not physically full. The key is to honor hunger and fullness without any feelings of guilt or shame.  Pay attention to what the internal cues are, rather than any external factors. This will enhance the confidence you have in listening to your own body and following those internal cues enabling you to increase how often you eat when you are hungry not out of appetite and stop when you are satisfied not full.  ?Encouraged pt to continue to eat balanced meals inclusive of non starchy vegetables 2 times a day 7 days a week ?Encouraged pt to choose lean protein sources: limiting beef, pork, sausage, hotdogs, and lunch meat ?Encourage pt to choose healthy fats such as plant based limiting animal fats ?Encouraged pt to continue to drink a minium 64 fluid ounces with half being plain water to satisfy proper hydration  ?Educated pt on balanced diet and getting a variety of foods with all the surgeries upcoming and the need to heal well. ?Educated pt on what determines energy level, to include hydration, variety of food (including carbohydrates), taking bariatric vitamins, pain level, mobility, and what pt has control over. ? ? ?Goals: ?-Continue with balanced/variety diet ? ?Handouts Provided Include  ? ? ?Learning Style & Readiness for Change ?Teaching method utilized: Visual & Auditory  ?Demonstrated degree of understanding via: Teach Back  ?Readiness Level: Action ?Barriers  to learning/adherence to lifestyle change: back pain ? ?RD's Notes for Next Visit ?Assess adherence to pt chosen goals ? ? ?MONITORING & EVALUATION ?Dietary intake, weekly physical activity, body weight, next lab draw ? ?Next Steps ?Patient is to follow-up: 3 months, virtual is fine but in person if need to get out. ?

## 2021-05-07 ENCOUNTER — Telehealth: Payer: Self-pay | Admitting: Physical Medicine and Rehabilitation

## 2021-05-07 NOTE — Telephone Encounter (Signed)
Pt called requesting a call back for right hip injection. Please call pt at [phone]. ?

## 2021-05-09 ENCOUNTER — Encounter: Payer: Self-pay | Admitting: Family Medicine

## 2021-05-09 ENCOUNTER — Ambulatory Visit (INDEPENDENT_AMBULATORY_CARE_PROVIDER_SITE_OTHER): Payer: 59 | Admitting: Family Medicine

## 2021-05-09 DIAGNOSIS — L989 Disorder of the skin and subcutaneous tissue, unspecified: Secondary | ICD-10-CM

## 2021-05-09 MED ORDER — DOXYCYCLINE HYCLATE 100 MG PO TABS: 100.0000 mg | ORAL_TABLET | Freq: Two times a day (BID) | ORAL | 0 refills | Status: DC

## 2021-05-09 NOTE — Progress Notes (Signed)
? ? ?  SUBJECTIVE:  ? ?CHIEF COMPLAINT / HPI:  ? ?Draining head lesion ?Patient reports that she was doing well and that her scalp lesion was not bothering her but noticed drainage this morning.  Denies any pain to the scalp but was concerned because of the drainage.  Has not had any other systemic symptoms such as fever or warmth around the area.  Still concerned regarding if the hair will grow back. ? ?Hypertension ?Patient denies any signs or symptoms of hypertension.  Reports that she has noticed her blood pressures have been a little elevated at home.  Denies any chest pain, shortness of breath.  Reports good compliance with her diabetes medications and is concerned she may need additional medications. ? ?OBJECTIVE:  ? ?BP (!) 159/94   Pulse (!) 109   Wt 285 lb 6.4 oz (129.5 kg)   LMP 04/10/2006   SpO2 94%   BMI 42.15 kg/m?   ?General: Pleasant 56 year old female ?HEENT: Patient with nickel sized cyst on the top of her head which is draining purulent fluid and has dried purulent fluid around it.  Nonerythematous, not warm to touch. ?Cardiac: Regular rate and rhythm on exam, decreased from when vitals were taken to approximately 84 bpm ?Respiratory: Normal work of breathing ? ?ASSESSMENT/PLAN:  ? ?Skin lesion of scalp ?Patient previously evaluated for cystic lesion on the scalp which did not appear infected at the time.  Evaluation today does not indicate infection but is draining purulent material.  No systemic symptoms.  Will prescribe doxycycline twice daily for 5 days and follow-up in 10 to 14 days for further evaluation.  May need referral to dermatology for drainage. ?  ? ? ?[name], MD ?[facility]  ? ?

## 2021-05-09 NOTE — Patient Instructions (Signed)
It was great seeing you today!  The lesion on your head appears to be improving but is draining now.  I want to send in a prescription for doxycycline that you will take for 5 days.  I will see you in around 10 days to see how it is doing and if it has not resolved yet we can send you to dermatology for drainage.  If you have any questions or concerns call the clinic.  Hope you have a great day! ? ?

## 2021-05-10 NOTE — Assessment & Plan Note (Signed)
Patient previously evaluated for cystic lesion on the scalp which did not appear infected at the time.  Evaluation today does not indicate infection but is draining purulent material.  No systemic symptoms.  Will prescribe doxycycline twice daily for 5 days and follow-up in 10 to 14 days for further evaluation.  May need referral to dermatology for drainage. ?

## 2021-05-16 ENCOUNTER — Ambulatory Visit: Payer: 59 | Admitting: Family Medicine

## 2021-05-16 ENCOUNTER — Encounter: Payer: Self-pay | Admitting: Family Medicine

## 2021-05-18 ENCOUNTER — Other Ambulatory Visit: Payer: Self-pay | Admitting: Podiatry

## 2021-05-18 ENCOUNTER — Other Ambulatory Visit: Payer: Self-pay | Admitting: Family Medicine

## 2021-05-18 ENCOUNTER — Other Ambulatory Visit: Payer: Self-pay | Admitting: Neurosurgery

## 2021-05-18 DIAGNOSIS — M4804 Spinal stenosis, thoracic region: Secondary | ICD-10-CM

## 2021-05-18 DIAGNOSIS — K146 Glossodynia: Secondary | ICD-10-CM

## 2021-05-18 DIAGNOSIS — M5412 Radiculopathy, cervical region: Secondary | ICD-10-CM

## 2021-05-18 DIAGNOSIS — M5416 Radiculopathy, lumbar region: Secondary | ICD-10-CM

## 2021-05-21 ENCOUNTER — Other Ambulatory Visit: Payer: Self-pay

## 2021-05-22 ENCOUNTER — Encounter: Payer: Self-pay | Admitting: Specialist

## 2021-05-22 ENCOUNTER — Ambulatory Visit: Payer: 59 | Admitting: Family Medicine

## 2021-05-24 ENCOUNTER — Ambulatory Visit (INDEPENDENT_AMBULATORY_CARE_PROVIDER_SITE_OTHER): Payer: 59 | Admitting: Specialist

## 2021-05-24 ENCOUNTER — Encounter: Payer: Self-pay | Admitting: Specialist

## 2021-05-24 VITALS — BP 129/83 | HR 114 | Ht 69.0 in | Wt 283.0 lb

## 2021-05-24 DIAGNOSIS — M67921 Unspecified disorder of synovium and tendon, right upper arm: Secondary | ICD-10-CM

## 2021-05-24 DIAGNOSIS — M40294 Other kyphosis, thoracic region: Secondary | ICD-10-CM

## 2021-05-24 DIAGNOSIS — M4012 Other secondary kyphosis, cervical region: Secondary | ICD-10-CM

## 2021-05-24 DIAGNOSIS — M19011 Primary osteoarthritis, right shoulder: Secondary | ICD-10-CM

## 2021-05-24 DIAGNOSIS — M4804 Spinal stenosis, thoracic region: Secondary | ICD-10-CM

## 2021-05-24 DIAGNOSIS — M4815 Ankylosing hyperostosis [Forestier], thoracolumbar region: Secondary | ICD-10-CM

## 2021-05-24 DIAGNOSIS — M5124 Other intervertebral disc displacement, thoracic region: Secondary | ICD-10-CM

## 2021-05-24 DIAGNOSIS — R29898 Other symptoms and signs involving the musculoskeletal system: Secondary | ICD-10-CM

## 2021-05-24 DIAGNOSIS — M7541 Impingement syndrome of right shoulder: Secondary | ICD-10-CM

## 2021-05-24 MED ORDER — HYDROXYZINE PAMOATE 50 MG PO CAPS: 50.0000 mg | ORAL_CAPSULE | Freq: Three times a day (TID) | ORAL | 0 refills | Status: DC | PRN

## 2021-05-24 MED ORDER — METHOCARBAMOL 750 MG PO TABS: 750.0000 mg | ORAL_TABLET | Freq: Four times a day (QID) | ORAL | 2 refills | Status: DC | PRN

## 2021-05-24 NOTE — Patient Instructions (Signed)
Plan: Avoid bending, stooping and avoid lifting weights greater than 10 lbs. ?Avoid prolong standing and walking. ?Avoid frequent bending and stooping  ?No lifting greater than 10 lbs. ?May use ice or moist heat for pain. ?Weight loss is of benefit. ?Handicap license is approved. ?With the myelogram and post myelogram CT scan we should be able to plan an intervention to relieve  ?Your discomfort. My concern is that you may need an extension of your fusion from L3 to the T10 or even T9 level.  ?Vistaril to potentiate the medications for pain  ?Methocarbamol for muscular spasm.  ?

## 2021-05-24 NOTE — Progress Notes (Signed)
? ?Office Visit Note ?  ?Patient: [name]           ?Date of Birth: December 29, 1965           ?MRN: [id number] ?Visit Date: 05/24/2021 ?             ?Requested by: Mary Shave, MD ?[address] ?[place],  [place] [address] ?PCP: Mary Shave, MD ? ? ?Assessment & Plan: ?Visit Diagnoses:  ?1. Arthritis of right acromioclavicular joint   ?2. Impingement syndrome of right shoulder   ?3. Biceps tendinopathy of right upper extremity   ?4. Thoracic disc herniation   ?5. Other kyphosis of thoracic region   ?6. Other secondary kyphosis, cervical region   ?7. 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 Right leg weakness   ?9. Thoracic spinal stenosis   ?10. Spinal stenosis, thoracic   ? ? ?Plan: Avoid bending, stooping and avoid lifting weights greater than 10 lbs. ?Avoid prolong standing and walking. ?Avoid frequent bending and stooping  ?No lifting greater than 10 lbs. ?May use ice or moist heat for pain. ?Weight loss is of benefit. ?Handicap license is approved. ?With the myelogram and post myelogram CT scan we should be able to plan an intervention to relieve  ?Your discomfort. My concern is that you may need an extension of your fusion from L3 to the T10 or even T9 level.  ? ?Follow-Up Instructions: No follow-ups on file.  ? ?Orders:  ?No orders of the defined types were placed in this encounter. ? ?Meds ordered this encounter  ?Medications  ? methocarbamol (ROBAXIN) 750 MG tablet  ?  Sig: Take 1 tablet (750 mg total) by mouth every 6 (six) hours as needed for muscle spasms.  ?  Dispense:  120 tablet  ?  Refill:  2  ? hydrOXYzine (VISTARIL) 50 MG capsule  ?  Sig: Take 1 capsule (50 mg total) by mouth 3 (three) times daily as needed.  ?  Dispense:  30 capsule  ?  Refill:  0  ? ? ? ? Procedures: ?No procedures performed ? ? ?Clinical Data: ?No additional findings. ? ? ?Subjective: ?Chief Complaint  ?Patient presents with  ? Neck - Follow-up, Pain  ? Lower Back - Follow-up, Pain  ? ? ?56 year  old female with history of lumbar fusions in the past for congenital stenosis and spondylolisthesis.L3-S1 with BAK cages anteriorly with BMP. She is experiencing the worst pain in her life with mainly back pain and also pain into the neck and thoracic spine. She has been see by Dr. Annette Osborne and he recommended a complete myelogram and post myelogram CT of neck, thoracic and lumbar areas. Her pain is diffuse, she ambulates with a walker. ? ?Review of Systems  ?Constitutional: Negative.   ?HENT: Negative.    ?Eyes: Negative.   ?Respiratory: Negative.    ?Cardiovascular: Negative.   ?Gastrointestinal: Negative.   ?Endocrine: Negative.   ?Genitourinary: Negative.   ?Musculoskeletal: Negative.   ?Skin: Negative.   ?Allergic/Immunologic: Negative.   ?Neurological: Negative.   ?Hematological: Negative.   ?Psychiatric/Behavioral: Negative.    ? ? ?Objective: ?Vital Signs: BP 129/83 (BP Location: Left Arm, Patient Position: Sitting)   Pulse (!) 114   Ht '5\' 9"'$  (1.753 m)   Wt 283 lb (128.4 kg)   LMP 04/10/2006   BMI 41.79 kg/m?  ? ?Physical Exam ?Constitutional:   ?   Appearance: She is well-developed.  ?HENT:  ?   Head: Normocephalic and atraumatic.  ?Eyes:  ?  Pupils: Pupils are equal, round, and reactive to light.  ?Pulmonary:  ?   Effort: Pulmonary effort is normal.  ?   Breath sounds: Normal breath sounds.  ?Abdominal:  ?   General: Bowel sounds are normal.  ?   Palpations: Abdomen is soft.  ?Musculoskeletal:  ?   Cervical back: Normal range of motion and neck supple.  ?   Lumbar back: Negative right straight leg raise test and negative left straight leg raise test.  ?Skin: ?   General: Skin is warm and dry.  ?Neurological:  ?   Mental Status: She is alert and oriented to person, place, and time.  ?Psychiatric:     ?   Behavior: Behavior normal.     ?   Thought Content: Thought content normal.     ?   Judgment: Judgment normal.  ? ?Back Exam  ? ?Tenderness  ?The patient is experiencing tenderness in the lumbar. ? ?Range  of Motion  ?Extension:  abnormal  ?Flexion:  abnormal  ?Lateral bend right:  abnormal  ?Lateral bend left:  abnormal  ?Rotation right:  abnormal  ?Rotation left:  abnormal  ? ?Muscle Strength  ?Right Quadriceps:  5/5  ?Left Quadriceps:  5/5  ?Right Hamstrings:  5/5  ?Left Hamstrings:  5/5  ? ?Tests  ?Straight leg raise right: negative ?Straight leg raise left: negative ? ? ? ?Specialty Comments:  ?No specialty comments available. ? ?Imaging: ?No results found. ? ? ?PMFS History: ?Patient Active Problem List  ? Diagnosis Date Noted  ? Biceps tendinopathy of right upper extremity 05/03/2021  ? Irregular heartbeat 04/11/2021  ? Neuropathic pain of foot 03/28/2021  ? Chronic congestion of paranasal sinus 03/28/2021  ? Impingement syndrome of right shoulder 03/08/2021  ? Arthritis of right acromioclavicular joint 03/08/2021  ? Acute medial meniscus tear of right knee 03/08/2021  ? Acute lateral meniscus tear of right knee 03/08/2021  ? Spinal stenosis of thoracic region with radiculopathy   ? Bilateral leg numbness   ? Spinal stenosis of lumbar region with neurogenic claudication   ? Unilateral primary osteoarthritis, right knee   ? Impaired ambulation 01/10/2021  ? Status post lumbar laminectomy 10/03/2020  ? Cubital tunnel syndrome on left 05/02/2020  ? Vaginal discharge 01/20/2020  ? Prolapse of anterior vaginal wall 11/10/2019  ? Skin ulcer of left thigh, limited to breakdown of skin ([place]) 09/09/2019  ? Ulnar nerve compression 08/06/2019  ? Diabetic neuropathy associated with type 2 diabetes mellitus ([place]) 08/06/2019  ? Body mass index 50.0-59.9, adult ([place]) 06/12/2019  ? Impingement syndrome of left shoulder 05/07/2019  ? Polyp of colon   ? Polyp of ascending colon   ? Rectal discomfort 02/12/2019  ? Osteoarthritis of right hip 02/12/2019  ? Nontraumatic tear of left supraspinatus tendon 12/30/2018  ? Gout 12/17/2018  ? Nontraumatic complete tear of right rotator cuff 12/08/2018  ? Rotator cuff tear, right  12/01/2018  ? Tobacco abuse 08/21/2018  ? Right groin pain 07/20/2018  ? Depressed mood 07/13/2018  ? Paroxysmal SVT (supraventricular tachycardia) ([place]) 03/20/2018  ? Pure hypercholesterolemia 03/20/2018  ? Muscle cramping 02/25/2018  ? Nodule of upper lobe of right lung 02/13/2018  ? Falls, subsequent encounter 01/30/2018  ? Pain in left foot 11/04/2017  ? Essential hypertension 10/27/2017  ? Solitary pulmonary nodule 10/07/2017  ? Restrictive lung disease secondary to obesity 10/01/2017  ? Polycythemia, secondary 10/01/2017  ? Chronic viral hepatitis B without delta-agent ([place]) 07/08/2017  ? COPD (chronic obstructive pulmonary disease) with  chronic bronchitis ([place]) 06/27/2017  ? Chronic kidney disease (CKD), stage IV (severe) ([place]) 01/14/2017  ? Decubitus ulcer 12/13/2016  ? Type 2 diabetes mellitus with stage 3 chronic kidney disease, with long-term current use of insulin ([place]) 12/12/2016  ? Urge incontinence of urine 12/05/2016  ? Trigger finger, left index finger 07/15/2016  ? Back pain 04/07/2014  ? Morbid obesity ([place]) 10/02/2012  ? Chronic diastolic CHF (congestive heart failure) ([place]) 04/07/2012  ? Skin lesion of scalp 03/15/2011  ? GERD 01/26/2010  ? Obstructive sleep apnea treated with BiPAP 02/03/2008  ? FIBROCYSTIC BREAST DISEASE 10/28/2006  ? Hyperlipidemia 03/27/2006  ? Obesity hypoventilation syndrome ([place]) 03/27/2006  ? Depression with anxiety 03/27/2006  ? ?Past Medical History:  ?Diagnosis Date  ? Alcoholism ([place])   ? Anxiety   ? Arthritis 04/10/2011  ? hips, shoulders, back  ? Asthma   ? Bipolar disorder ([place])   ? Cardiomegaly   ? Carpal tunnel syndrome, bilateral   ? Cervical cancer ([place]) 1993  ? cervical, no treatment done, went away per pt  ? Cervical dysplasia or atypia 04/10/2011  ? '93- once dx.-got pregnant-no intervention, then postpartum, no dysplasia found  ? CHF (congestive heart failure) ([place])   ? no cardiologist 2014 dx, none now  ? Chronic hypoxemic respiratory failure ([facility])   ?  Chronic kidney disease   ? stage 3 kidney disease  ? Condyloma - gluteal cleft 04/09/2011  ? Removed by general surgery. Pathology showed Condyloma, gluteal CONDYLOMA ACUMINATUM.   ? COPD (chronic obstructive pulmon

## 2021-05-25 ENCOUNTER — Ambulatory Visit
Admission: RE | Admit: 2021-05-25 | Discharge: 2021-05-25 | Disposition: A | Discharge status: Home / Self Care | Payer: 59 | Source: Ambulatory Visit | Attending: Neurosurgery | Admitting: Neurosurgery

## 2021-05-25 DIAGNOSIS — M4804 Spinal stenosis, thoracic region: Secondary | ICD-10-CM

## 2021-05-25 DIAGNOSIS — M5412 Radiculopathy, cervical region: Secondary | ICD-10-CM

## 2021-05-25 DIAGNOSIS — M5416 Radiculopathy, lumbar region: Secondary | ICD-10-CM

## 2021-05-25 MED ORDER — DIAZEPAM 5 MG PO TABS: 10.0000 mg | ORAL_TABLET | Freq: Once | ORAL | Status: DC

## 2021-05-25 MED ORDER — MEPERIDINE HCL 50 MG/ML IJ SOLN
50.0000 mg | Freq: Once | INTRAMUSCULAR | Status: AC | PRN
  Administered 2021-05-25: 50 mg via INTRAMUSCULAR

## 2021-05-25 MED ORDER — IOPAMIDOL (ISOVUE-M 300) INJECTION 61%
10.0000 mL | Freq: Once | INTRAMUSCULAR | Status: AC
  Administered 2021-05-25: 10 mL via INTRATHECAL

## 2021-05-25 MED ORDER — ONDANSETRON HCL 4 MG/2ML IJ SOLN
4.0000 mg | Freq: Once | INTRAMUSCULAR | Status: AC | PRN
  Administered 2021-05-25: 4 mg via INTRAMUSCULAR

## 2021-05-25 NOTE — Discharge Instr - Other Orders (Signed)
1330: Pt report pain 9/10 post myelogram procedure, see MAR.  ?

## 2021-05-25 NOTE — Discharge Instructions (Signed)

## 2021-05-29 NOTE — Telephone Encounter (Signed)
error 

## 2021-06-01 ENCOUNTER — Ambulatory Visit: Payer: 59 | Admitting: Family Medicine

## 2021-06-05 ENCOUNTER — Ambulatory Visit: Payer: 59 | Admitting: Family Medicine

## 2021-06-05 ENCOUNTER — Other Ambulatory Visit: Payer: Self-pay

## 2021-06-05 ENCOUNTER — Encounter (HOSPITAL_BASED_OUTPATIENT_CLINIC_OR_DEPARTMENT_OTHER): Payer: Self-pay | Admitting: Orthopaedic Surgery

## 2021-06-05 NOTE — Progress Notes (Signed)
Chart reviewed with Dr. Kalman Shan, due to extensive history. Ok to proceed with surgery at [facility] as planned.  ? ?

## 2021-06-06 ENCOUNTER — Ambulatory Visit (INDEPENDENT_AMBULATORY_CARE_PROVIDER_SITE_OTHER): Payer: 59 | Admitting: Physical Medicine and Rehabilitation

## 2021-06-06 ENCOUNTER — Encounter: Payer: Self-pay | Admitting: Physical Medicine and Rehabilitation

## 2021-06-06 ENCOUNTER — Ambulatory Visit: Payer: Self-pay

## 2021-06-06 DIAGNOSIS — M25551 Pain in right hip: Secondary | ICD-10-CM

## 2021-06-06 NOTE — Progress Notes (Signed)
   [name] - 56 y.o. female MRN [id number]  Date of birth: [birth year]  Office Visit Note: Visit Date: 06/06/2021 PCP: [name], MD Referred by: [name], MD  Subjective: Chief Complaint  Patient presents with   Right Hip - Pain   HPI:  [name] is a 56 y.o. female who comes in today at the request of Dr. [name] for planned Right anesthetic hip arthrogram with fluoroscopic guidance.  The patient has failed conservative care including home exercise, medications, time and activity modification.  This injection will be diagnostic and hopefully therapeutic.  Please see requesting physician notes for further details and justification.  ROS Otherwise per HPI.  Assessment & Plan: Visit Diagnoses:    ICD-10-CM   1. Pain in right hip  M25.551 Large Joint Inj: R hip joint    XR C-ARM NO REPORT      Plan: No additional findings.   Meds & Orders: No orders of the defined types were placed in this encounter.   Orders Placed This Encounter  Procedures   Large Joint Inj: R hip joint   XR C-ARM NO REPORT    Follow-up: Return if symptoms worsen or fail to improve.   Procedures: Large Joint Inj: R hip joint on 06/06/2021 1:56 PM Indications: diagnostic evaluation and pain Details: 22 G 3.5 in needle, fluoroscopy-guided anterior approach  Arthrogram: No  Medications: 4 mL bupivacaine 0.25 %; 60 mg triamcinolone acetonide 40 MG/ML Outcome: tolerated well, no immediate complications  There was excellent flow of contrast producing a partial arthrogram of the hip. The patient did have relief of symptoms during the anesthetic phase of the injection. Procedure, treatment alternatives, risks and benefits explained, specific risks discussed. Consent was given by the patient. Immediately prior to procedure a time out was called to verify the correct patient, procedure, equipment, support staff and site/side marked as required. Patient was prepped and draped in the  usual sterile fashion.         Clinical History: No specialty comments available.     Objective:  VS:  HT:    WT:   BMI:     BP:   HR: bpm  TEMP: ( )  RESP:  Physical Exam   Imaging: No results found.

## 2021-06-06 NOTE — Progress Notes (Signed)
Numeric Pain Rating Scale and Functional Assessment Average Pain 10   In the last MONTH (on 0-10 scale) has pain interfered with the following?  1. General activity like being  able to carry out your everyday physical activities such as walking, climbing stairs, carrying groceries, or moving a chair?  Rating(10)   +Driver, -BT, -Dye Allergies. 

## 2021-06-07 ENCOUNTER — Encounter (HOSPITAL_BASED_OUTPATIENT_CLINIC_OR_DEPARTMENT_OTHER)
Admission: RE | Admit: 2021-06-07 | Discharge: 2021-06-07 | Disposition: A | Discharge status: Home / Self Care | Payer: 59 | Source: Ambulatory Visit | Attending: Orthopaedic Surgery | Admitting: Orthopaedic Surgery

## 2021-06-07 DIAGNOSIS — Z01812 Encounter for preprocedural laboratory examination: Secondary | ICD-10-CM | POA: Insufficient documentation

## 2021-06-07 LAB — BASIC METABOLIC PANEL
Anion gap: 13 (ref 5–15)
BUN: 24 mg/dL — ABNORMAL HIGH (ref 6–20)
CO2: 24 mmol/L (ref 22–32)
Calcium: 9.9 mg/dL (ref 8.9–10.3)
Chloride: 102 mmol/L (ref 98–111)
Creatinine, Ser: 1.02 mg/dL — ABNORMAL HIGH (ref 0.44–1.00)
GFR, Estimated: >60 mL/min (ref >60)
Glucose, Bld: 132 mg/dL — ABNORMAL HIGH (ref 70–99)
Potassium: 4.5 mmol/L (ref 3.5–5.1)
Sodium: 139 mmol/L (ref 135–145)

## 2021-06-07 NOTE — Progress Notes (Signed)
Surgical soap given with instructions, pt verbalized understanding. Enhanced Recovery after Surgery  ?Enhanced Recovery after Surgery is a protocol used to improve the stress on your body and your recovery after surgery. ? ?Patient Instructions ? ?The night before surgery:  ?No food after midnight. ONLY clear liquids after midnight ? ?The day of surgery (if you do NOT have diabetes):  ?Drink ONE (1) Pre-Surgery Clear Ensure as directed.   ?This drink was given to you during your hospital  ?pre-op appointment visit. ?The pre-op nurse will instruct you on the time to drink the  ?Pre-Surgery Ensure depending on your surgery time. ?Finish the drink at the designated time by the pre-op nurse.  ?Nothing else to drink after completing the  ?Pre-Surgery Clear Ensure. ? ?The day of surgery (if you have diabetes): ?Drink ONE (1) Gatorade 2 (G2) as directed. ?This drink was given to you during your hospital  ?pre-op appointment visit.  ?The pre-op nurse will instruct you on the time to drink the  ? Gatorade 2 (G2) depending on your surgery time. ?Color of the Gatorade may vary. Red is not allowed. ?Nothing else to drink after completing the  ?Gatorade 2 (G2). ? ?       If office.you have questions, please contact your surgeon?s office  ?

## 2021-06-08 ENCOUNTER — Encounter: Payer: Self-pay | Admitting: Podiatry

## 2021-06-08 ENCOUNTER — Ambulatory Visit (INDEPENDENT_AMBULATORY_CARE_PROVIDER_SITE_OTHER): Payer: 59 | Admitting: Podiatry

## 2021-06-08 DIAGNOSIS — N184 Chronic kidney disease, stage 4 (severe): Secondary | ICD-10-CM

## 2021-06-08 DIAGNOSIS — M79674 Pain in right toe(s): Secondary | ICD-10-CM

## 2021-06-08 DIAGNOSIS — M79675 Pain in left toe(s): Secondary | ICD-10-CM

## 2021-06-08 DIAGNOSIS — E1142 Type 2 diabetes mellitus with diabetic polyneuropathy: Secondary | ICD-10-CM

## 2021-06-08 DIAGNOSIS — B351 Tinea unguium: Secondary | ICD-10-CM

## 2021-06-08 NOTE — Progress Notes (Signed)
This patient returns to my office for at risk foot care.  This patient requires this care by a professional since this patient will be at risk due to having chronic kidney disease and type 2 diabetes.  This patient is unable to cut nails herself since the patient cannot reach her nails.These nails are painful walking and wearing shoes.  This patient presents for at risk foot care today.  General Appearance  Alert, conversant and in no acute stress.  Vascular  Dorsalis pedis and posterior tibial  pulses are palpable  bilaterally.  Capillary return is within normal limits  bilaterally. Temperature is within normal limits  bilaterally.  Neurologic  Senn-Weinstein monofilament wire test diminished  bilaterally. Muscle power within normal limits bilaterally.  Nails Thick disfigured discolored nails with subungual debris  from hallux to fifth toes bilaterally. No evidence of bacterial infection or drainage bilaterally.  Orthopedic  No limitations of motion  feet .  No crepitus or effusions noted.  No bony pathology or digital deformities noted.  HAV  Left foot.  Skin  normotropic skin with no porokeratosis noted bilaterally.  No signs of infections or ulcers noted.     Onychomycosis  Pain in right toes  Pain in left toes  Consent was obtained for treatment procedures.   Mechanical debridement of nails 1-5  bilaterally performed with a nail nipper.  Filed with dremel without incident.    Return office visit   3 months                 Told patient to return for periodic foot care and evaluation due to potential at risk complications.   Kippy Gohman DPM   

## 2021-06-11 ENCOUNTER — Other Ambulatory Visit: Payer: Self-pay | Admitting: Family Medicine

## 2021-06-11 ENCOUNTER — Other Ambulatory Visit: Payer: Self-pay | Admitting: Podiatry

## 2021-06-11 ENCOUNTER — Other Ambulatory Visit: Payer: Self-pay | Admitting: Neurosurgery

## 2021-06-12 ENCOUNTER — Encounter: Payer: Self-pay | Admitting: Family Medicine

## 2021-06-12 ENCOUNTER — Other Ambulatory Visit: Payer: Self-pay | Admitting: *Deleted

## 2021-06-12 ENCOUNTER — Telehealth: Payer: Self-pay | Admitting: Orthopaedic Surgery

## 2021-06-12 MED ORDER — KETOCONAZOLE 2 % EX CREA: 1.0000 "application " | TOPICAL_CREAM | Freq: Every day | CUTANEOUS | 0 refills | Status: DC

## 2021-06-12 NOTE — Telephone Encounter (Signed)
Patient called yesterday and left voice mail message to cancel her right shoulder surgery with Dr. Erlinda Hong scheduled for for 06-13-21.  I returned call today.  Patient states she is cancelling because she is having spine surgery in June.  She will call when she is recovered from the back surgery.  ? ?

## 2021-06-13 ENCOUNTER — Ambulatory Visit (HOSPITAL_BASED_OUTPATIENT_CLINIC_OR_DEPARTMENT_OTHER): Admission: RE | Admit: 2021-06-13 | Payer: 59 | Source: Home / Self Care | Admitting: Orthopaedic Surgery

## 2021-06-13 DIAGNOSIS — N184 Chronic kidney disease, stage 4 (severe): Secondary | ICD-10-CM

## 2021-06-13 SURGERY — SHOULDER ARTHROSCOPY WITH SUBACROMIAL DECOMPRESSION, ROTATOR CUFF REPAIR AND BICEP TENDON REPAIR
Anesthesia: General | Laterality: Right

## 2021-06-13 MED ORDER — GABAPENTIN 300 MG PO CAPS: 600.0000 mg | ORAL_CAPSULE | Freq: Three times a day (TID) | ORAL | 2 refills | Status: DC

## 2021-06-18 ENCOUNTER — Ambulatory Visit: Payer: 59 | Admitting: Specialist

## 2021-06-20 ENCOUNTER — Encounter: Payer: 59 | Admitting: Orthopaedic Surgery

## 2021-06-24 MED ORDER — TRIAMCINOLONE ACETONIDE 40 MG/ML IJ SUSP
60.0000 mg | INTRAMUSCULAR | Status: AC | PRN
  Administered 2021-06-06: 60 mg via INTRA_ARTICULAR

## 2021-06-24 MED ORDER — BUPIVACAINE HCL 0.25 % IJ SOLN
4.0000 mL | INTRAMUSCULAR | Status: AC | PRN
  Administered 2021-06-06: 4 mL via INTRA_ARTICULAR

## 2021-06-26 ENCOUNTER — Other Ambulatory Visit: Payer: Self-pay

## 2021-06-26 ENCOUNTER — Encounter (HOSPITAL_COMMUNITY): Payer: Self-pay | Admitting: Neurosurgery

## 2021-06-26 NOTE — Progress Notes (Addendum)
PCP - Dr. Karsten Fells Cardiologist - Dr. [name]  PPM/ICD - Denies  Chest x-ray - N/A EKG - 04/11/21 Stress Test - 04/13/19 /ECHO - 10/01/17 Cardiac Cath - Denies  Sleep Study - OSA CPAP - No  Denies having diabetes. Patient states she is no longer diabetic since weight loss.  Blood Thinner Instructions: N/A Aspirin Instructions: N/A  ERAS Protcol - No  COVID TEST- N/A   Anesthesia review: Yes, cardiac hx  Patient denies shortness of breath, fever, cough and chest pain at PAT appointment   All instructions explained to the patient, with a verbal understanding of the material. Patient agrees to go over the instructions while at home for a better understanding. Patient also instructed to self quarantine after being tested for COVID-19. The opportunity to ask questions was provided.    Surgical Instructions    Your procedure is scheduled on Friday, June 29, 2021 at 8:00 AM.  Report to [facility] Main Entrance "A" at 5:30 A.M., then check in with the Admitting office.  Call this number if you have problems the morning of surgery:  [phone]   If you have any questions prior to your surgery date call [phone]: Open Monday-Friday 8am-4pm    Remember:  Do not eat or drink after midnight the night before your surgery    Take these medicines the morning of surgery with A SIP OF WATER:  acetaminophen (TYLENOL)  atorvastatin (LIPITOR) baclofen (LIORESAL) busPIRone (BUSPAR) diltiazem (CARDIZEM CD) fluticasone (FLONASE) Fluticasone-Umeclidin-Vilant (TRELEGY ELLIPTA) gabapentin (NEURONTIN)  loratadine (CLARITIN) omeprazole (PRILOSEC) venlafaxine XR (EFFEXOR-XR)   IF NEEDED: albuterol (VENTOLIN HFA)  hydrOXYzine (VISTARIL)  methocarbamol (ROBAXIN)   Please bring all inhalers with you the day of surgery.    As of today, STOP taking any Aspirin (unless otherwise instructed by your surgeon) Aleve, Naproxen, Ibuprofen, Motrin, Advil, Goody's, BC's,  all herbal medications, fish oil, and all vitamins.                     Do NOT Smoke (Tobacco/Vaping) for 24 hours prior to your procedure.  If you use a CPAP at night, you may bring your mask/headgear for your overnight stay.   Contacts, glasses, piercing's, hearing aid's, dentures or partials may not be worn into surgery, please bring cases for these belongings.    For patients admitted to the hospital, discharge time will be determined by your treatment team.   Patients discharged the day of surgery will not be allowed to drive home, and someone needs to stay with them for 24 hours.  SURGICAL WAITING ROOM VISITATION Patients having surgery or a procedure may have two support people in the waiting area. Visitors may stay in the waiting area during the procedure and switch out with other visitors if needed. Children under the age of 48 must have an adult accompany them who is not the patient. If the patient needs to stay at the hospital during part of their recovery, the visitor guidelines for inpatient rooms apply.  Please refer to the [facility] website for the visitor guidelines for Inpatients (after your surgery is over and you are in a regular room).    Day of Surgery: Take a shower with mild soap. Do not wear jewelry or makeup Do not wear lotions, powders, perfumes, or deodorant. Do not shave 48 hours prior to surgery. Do not bring valuables to the hospital.  [facility] is not responsible for any belongings or valuables. Do not wear nail polish, gel polish, artificial  nails, or any other type of covering on natural nails (fingers and toes) If you have artificial nails or gel coating that need to be removed by a nail salon, please have this removed prior to surgery. Artificial nails or gel coating may interfere with anesthesia's ability to adequately monitor your vital signs. Wear Clean/Comfortable clothing the morning of surgery Do not apply any deodorants/lotions.   Remember  to brush your teeth WITH YOUR REGULAR TOOTHPASTE.   Please read over the following fact sheets that you were given.  If you received a COVID test during your pre-op visit  it is requested that you wear a mask when out in public, stay away from anyone that may not be feeling well and notify your surgeon if you develop symptoms. If you have been in contact with anyone that has tested positive in the last 10 days please notify you surgeon.

## 2021-06-27 NOTE — Anesthesia Preprocedure Evaluation (Signed)
Anesthesia Evaluation  Patient identified by MRN, date of birth, ID band Patient awake    Reviewed: Allergy & Precautions, NPO status , Patient's Chart, lab work & pertinent test results, reviewed documented beta blocker date and time   Airway Mallampati: I  TM Distance: >3 FB Neck ROM: Full    Dental  (+) Edentulous Upper,    Pulmonary shortness of breath and with exertion, asthma , sleep apnea and Continuous Positive Airway Pressure Ventilation , pneumonia, resolved, COPD,  COPD inhaler, Current Smoker,    Pulmonary exam normal breath sounds clear to auscultation       Cardiovascular hypertension, Pt. 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    Abdominal (+) + obese,   Peds  Hematology   Anesthesia Other Findings   Reproductive/Obstetrics                           Anesthesia Physical Anesthesia Plan  ASA: 3  Anesthesia Plan: General   Post-op Pain Management: Precedex, Ketamine IV*, Tylenol PO (pre-op)*, Lidocaine infusion* and Dilaudid IV   Induction: Intravenous  PONV Risk Score and Plan: 4 or greater and Treatment may vary due to age or medical condition, Ondansetron and Dexamethasone  Airway Management Planned: Oral ETT  Additional Equipment: None  Intra-op Plan:   Post-operative Plan: Extubation in OR  Informed Consent: I have reviewed the patients History  and Physical, chart, labs and discussed the procedure including the risks, benefits and alternatives for the proposed anesthesia with the patient or authorized representative who has indicated his/her understanding and acceptance.     Dental advisory given  Plan Discussed with: CRNA and Anesthesiologist  Anesthesia Plan Comments: (PAT note by Karoline Caldwell, PA-C: Follows with cardiology for history of HFpEF, paroxysmal SVT, HTN, HLD. Last seen by Dr. Harrell Gave 01/04/2021. 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 Weight down over 50lbs at that time. NYHA class II diastolic HF symptoms at that time. Her diuretics were reduced from daily to PRN. No recent palpitations, continues diltiazem for hx of PSVT. 6 month followup recommended.   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 When last seen by neurology on 03/13/21, it was noted pt had lost 123 lbs following bariatric surgery. Pt reported she was no longer using BiPAP. Repeat home sleep test recommended, however I do not see that this has been completed.   Patient also has medical clearance from PCP Dr. Lorin Mercy 08/30/2020.  DM2, previously on insulin but has been discontinued since weight loss, last A1c 6.9 on 05/01/21.  Recently underwent laparoscopic sleeve gastrectomy 0/35/0093 without complication.  CBC 06/07/2021 in Care Everywhere reviewed, abnormal, WBC elevated at 17.6, Hgb elevated at 19.1.  Labs ordered by general surgery for routine follow-up s/p bariatric surgery.  BMP 06/07/21 reviewed, unremarkable.  Patient will need day of surgery evaluation.  EKG 04/11/2021: Sinus rhythm.  Rate 93.  Nuclear stress 04/13/2019: . The  left ventricular ejection fraction is mildly decreased (45-54%). . Nuclear stress EF: 47%. . There was no ST  segment deviation noted during stress. . This is an intermediate risk study due to reduced systolic function. There is no ischemia. [name] Kitchen New wall motion abnormalities noted. Consider echo if clinically warranted.  Dr. Harrell Gave commented on result stating, "she has had 2 day nuclear stress test as she cannot achieve >4 METs of activity. I personally reviewed this study. There is no evidence of ischemia. There is some question of low normal EF and wall motion, but on my review of the study, I cannot exclude this being artifact."  Event monitor 12/02/17: 3 days of rhythm interpreted on Zio patch. Patient had a min HR of 58 bpm, max HR of 193 bpm, and avg HR of 81 bpm. Predominant underlying rhythm was Sinus Rhythm. 217 Supraventricular Tachycardia runs occurred, the run with the fastest interval lasting 6 beats with a max rate of 193 bpm, the longest lasting 1 min 13 secs with an avg rate of 151 bpm. True duration of Supraventricular Tachycardia difficult to ascertain due to Artifact. Thirteen triggered patient events recorded. Supraventricular Tachycardia was detected within +/- 45 seconds of symptomatic patient event(s). Isolated SVEs were occasional (2.0%, 6813), SVE Couplets were rare (<1.0%, 235), and SVE Triplets were rare (<1.0%, 66). No significant ventricular ectopy noted.  TTE 10/01/2017: - Left ventricle: The cavity size was normal. Wall thickness was  normal. Systolic function was normal. The estimated ejection  fraction was in the range of 55% to 60%. Wall motion was normal;  there were no regional wall motion abnormalities. Doppler  parameters are consistent with abnormal left ventricular  relaxation (grade 1 diastolic dysfunction).   )      Anesthesia Quick Evaluation

## 2021-06-27 NOTE — Progress Notes (Signed)
Anesthesia Chart Review: Same day workup  Follows with cardiology for history of HFpEF, paroxysmal SVT, HTN, HLD.  Last seen by Dr. Harrell Gave 01/04/2021.  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 Weight down over 50lbs at that time. NYHA class II diastolic HF symptoms at that time. Her diuretics were reduced from daily to PRN. No recent palpitations, continues diltiazem for hx of PSVT. 6 month followup recommended.    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 When last seen by neurology on 03/13/21, it was noted pt had lost 123 lbs following bariatric surgery. Pt reported she was no longer using BiPAP. Repeat home sleep test recommended, however I do not see that this has been completed.    Patient also has medical clearance from PCP Dr. [name] dated 08/30/2020.   DM2, previously on insulin but has been discontinued since weight loss, last A1c 6.9 on 05/01/21.   Recently underwent laparoscopic sleeve gastrectomy 5/72/6203 without complication.   CBC 06/07/2021 in Care Everywhere reviewed, abnormal, WBC elevated at 17.6, Hgb elevated at 19.1.  Labs ordered by general surgery for routine follow-up s/p bariatric surgery.  BMP 06/07/21 reviewed, unremarkable.  Patient will need day of surgery evaluation.   EKG 04/11/2021: Sinus rhythm.  Rate 93.   Nuclear stress 04/13/2019: The left ventricular ejection fraction is mildly decreased (45-54%). Nuclear stress EF: 47%. There was no ST segment deviation noted during stress. This is an intermediate risk study due to reduced systolic function. There is no ischemia. New wall motion abnormalities noted. Consider echo if clinically warranted.   Dr. Harrell Gave commented on result stating, " she has had 2 day nuclear stress test as she cannot achieve >4 METs of activity.  I personally reviewed this study. There is no evidence of ischemia. There is some question of low normal EF and wall motion, but on my review of the study, I cannot exclude this being artifact."   Event monitor 12/02/17: 3 days of rhythm interpreted on Zio patch. Patient had a min HR of 58 bpm, max HR of 193 bpm, and avg HR of 81 bpm. Predominant underlying rhythm was Sinus Rhythm. 217 Supraventricular Tachycardia runs occurred, the run with the fastest interval lasting 6 beats with a max rate of 193 bpm, the longest lasting 1 min 13 secs with an avg rate of 151 bpm. True duration of Supraventricular Tachycardia difficult to ascertain due to Artifact. Thirteen triggered patient events recorded. Supraventricular Tachycardia was detected within +/- 45 seconds of symptomatic patient event(s). Isolated SVEs were occasional (2.0%, 6813), SVE Couplets were rare (<1.0%, 235), and SVE Triplets were rare (<1.0%, 66). No significant ventricular ectopy noted.   TTE 10/01/2017: - Left ventricle: The cavity size was normal. Wall thickness was    normal. Systolic function was normal. The estimated ejection    fraction was in the range of 55% to 60%. Wall motion was normal;    there were no regional wall motion abnormalities. Doppler    parameters are consistent with abnormal left ventricular    relaxation (grade 1 diastolic dysfunction).     [name] [facility] Short Stay Center/Anesthesiology Phone [phone] 06/27/2021 11:01 AM

## 2021-06-28 ENCOUNTER — Encounter: Payer: Self-pay | Admitting: Family Medicine

## 2021-06-28 ENCOUNTER — Ambulatory Visit (INDEPENDENT_AMBULATORY_CARE_PROVIDER_SITE_OTHER): Payer: 59 | Admitting: Family Medicine

## 2021-06-28 VITALS — BP 138/76 | HR 95 | Ht 69.0 in | Wt 285.2 lb

## 2021-06-28 DIAGNOSIS — M5416 Radiculopathy, lumbar region: Secondary | ICD-10-CM

## 2021-06-28 MED ORDER — DICLOFENAC SODIUM 1 % EX GEL: CUTANEOUS | 1 refills | Status: DC

## 2021-06-28 NOTE — Progress Notes (Unsigned)
    SUBJECTIVE:   CHIEF COMPLAINT / HPI:   Preop evaluation Patient is presenting today for preop evaluation.  She is having spinal surgery tomorrow and was told that she needed to have a visit so that she can get home health PT as well as a nurses aide.  She spoke with the insurance company and they told her she had to come in.  She is excited about her procedure and hopes that it will help with her lumbar radiculopathy.  Is otherwise doing well and has no major concerns.  OBJECTIVE:   BP 138/76   Pulse 95   Ht '5\' 9"'$  (1.753 m)   Wt 285 lb 3.2 oz (129.4 kg)   LMP 04/10/2006   SpO2 94%   BMI 42.12 kg/m   General: Pleasant 56 year old female in no acute distress Cardiac: Regular rate and rhythm, no murmurs appreciated Respiratory: Normal for breathing, lungs clear to auscultation Abdomen: Soft, nontender MSK: Ambulates with standing walker  ASSESSMENT/PLAN:   Lumbar radiculopathy Patient scheduled for procedure for lumbar radiculopathy tomorrow.  Is in need of home health services after the procedure.  Order placed for home health PT as well as nursing aide for after the procedure.  No further issues or concerns at this time.     [name], MD [place] \

## 2021-06-28 NOTE — Patient Instructions (Signed)
It was great seeing you today!  I am glad you are having your procedure tomorrow.  I put a referral in for home health with PT as well as a nurses aide.  Please let me know if you have any issues with that.  I hope your procedure goes well and please let me know if there is anything you need from [place].  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

## 2021-06-29 ENCOUNTER — Inpatient Hospital Stay (HOSPITAL_COMMUNITY)
Admission: RE | Disposition: A | Discharge status: Home Health | Payer: Self-pay | Source: Home / Self Care | Attending: Neurosurgery

## 2021-06-29 ENCOUNTER — Inpatient Hospital Stay (HOSPITAL_COMMUNITY): Payer: 59 | Admitting: Emergency Medicine

## 2021-06-29 ENCOUNTER — Inpatient Hospital Stay (HOSPITAL_COMMUNITY): Payer: 59

## 2021-06-29 ENCOUNTER — Other Ambulatory Visit: Payer: Self-pay

## 2021-06-29 ENCOUNTER — Encounter (HOSPITAL_COMMUNITY): Payer: Self-pay | Admitting: Neurosurgery

## 2021-06-29 ENCOUNTER — Inpatient Hospital Stay (HOSPITAL_COMMUNITY)
Admission: RE | Admit: 2021-06-29 | Discharge: 2021-07-02 | DRG: 460 | Disposition: A | Discharge status: Home Health | Payer: 59 | Attending: Neurosurgery | Admitting: Neurosurgery

## 2021-06-29 DIAGNOSIS — Z825 Family history of asthma and other chronic lower respiratory diseases: Secondary | ICD-10-CM | POA: Diagnosis not present

## 2021-06-29 DIAGNOSIS — I509 Heart failure, unspecified: Secondary | ICD-10-CM | POA: Diagnosis not present

## 2021-06-29 DIAGNOSIS — J449 Chronic obstructive pulmonary disease, unspecified: Secondary | ICD-10-CM | POA: Diagnosis present

## 2021-06-29 DIAGNOSIS — F419 Anxiety disorder, unspecified: Secondary | ICD-10-CM | POA: Diagnosis present

## 2021-06-29 DIAGNOSIS — Z885 Allergy status to narcotic agent status: Secondary | ICD-10-CM

## 2021-06-29 DIAGNOSIS — N183 Chronic kidney disease, stage 3 unspecified: Secondary | ICD-10-CM | POA: Diagnosis present

## 2021-06-29 DIAGNOSIS — M532X6 Spinal instabilities, lumbar region: Secondary | ICD-10-CM

## 2021-06-29 DIAGNOSIS — F32A Depression, unspecified: Secondary | ICD-10-CM | POA: Diagnosis present

## 2021-06-29 DIAGNOSIS — M19011 Primary osteoarthritis, right shoulder: Secondary | ICD-10-CM | POA: Diagnosis present

## 2021-06-29 DIAGNOSIS — E1122 Type 2 diabetes mellitus with diabetic chronic kidney disease: Secondary | ICD-10-CM | POA: Diagnosis present

## 2021-06-29 DIAGNOSIS — M19012 Primary osteoarthritis, left shoulder: Secondary | ICD-10-CM | POA: Diagnosis present

## 2021-06-29 DIAGNOSIS — G834 Cauda equina syndrome: Secondary | ICD-10-CM | POA: Diagnosis present

## 2021-06-29 DIAGNOSIS — M48062 Spinal stenosis, lumbar region with neurogenic claudication: Secondary | ICD-10-CM

## 2021-06-29 DIAGNOSIS — M5416 Radiculopathy, lumbar region: Secondary | ICD-10-CM | POA: Insufficient documentation

## 2021-06-29 DIAGNOSIS — M479 Spondylosis, unspecified: Secondary | ICD-10-CM | POA: Diagnosis present

## 2021-06-29 DIAGNOSIS — M160 Bilateral primary osteoarthritis of hip: Secondary | ICD-10-CM | POA: Diagnosis present

## 2021-06-29 DIAGNOSIS — I5032 Chronic diastolic (congestive) heart failure: Secondary | ICD-10-CM | POA: Diagnosis present

## 2021-06-29 DIAGNOSIS — G4730 Sleep apnea, unspecified: Secondary | ICD-10-CM | POA: Diagnosis present

## 2021-06-29 DIAGNOSIS — I130 Hypertensive heart and chronic kidney disease with heart failure and stage 1 through stage 4 chronic kidney disease, or unspecified chronic kidney disease: Secondary | ICD-10-CM | POA: Diagnosis present

## 2021-06-29 DIAGNOSIS — Z8249 Family history of ischemic heart disease and other diseases of the circulatory system: Secondary | ICD-10-CM | POA: Diagnosis not present

## 2021-06-29 DIAGNOSIS — Z7951 Long term (current) use of inhaled steroids: Secondary | ICD-10-CM

## 2021-06-29 DIAGNOSIS — I110 Hypertensive heart disease with heart failure: Secondary | ICD-10-CM | POA: Diagnosis not present

## 2021-06-29 DIAGNOSIS — E785 Hyperlipidemia, unspecified: Secondary | ICD-10-CM | POA: Diagnosis present

## 2021-06-29 DIAGNOSIS — G43909 Migraine, unspecified, not intractable, without status migrainosus: Secondary | ICD-10-CM | POA: Diagnosis present

## 2021-06-29 DIAGNOSIS — K219 Gastro-esophageal reflux disease without esophagitis: Secondary | ICD-10-CM | POA: Diagnosis present

## 2021-06-29 DIAGNOSIS — Z79899 Other long term (current) drug therapy: Secondary | ICD-10-CM

## 2021-06-29 DIAGNOSIS — Z886 Allergy status to analgesic agent status: Secondary | ICD-10-CM

## 2021-06-29 DIAGNOSIS — Z8261 Family history of arthritis: Secondary | ICD-10-CM

## 2021-06-29 DIAGNOSIS — Z8701 Personal history of pneumonia (recurrent): Secondary | ICD-10-CM | POA: Diagnosis not present

## 2021-06-29 DIAGNOSIS — F17210 Nicotine dependence, cigarettes, uncomplicated: Secondary | ICD-10-CM | POA: Diagnosis present

## 2021-06-29 DIAGNOSIS — Z8541 Personal history of malignant neoplasm of cervix uteri: Secondary | ICD-10-CM

## 2021-06-29 DIAGNOSIS — E1140 Type 2 diabetes mellitus with diabetic neuropathy, unspecified: Secondary | ICD-10-CM | POA: Diagnosis present

## 2021-06-29 DIAGNOSIS — I447 Left bundle-branch block, unspecified: Secondary | ICD-10-CM | POA: Diagnosis present

## 2021-06-29 DIAGNOSIS — Z881 Allergy status to other antibiotic agents status: Secondary | ICD-10-CM

## 2021-06-29 DIAGNOSIS — M79606 Pain in leg, unspecified: Secondary | ICD-10-CM | POA: Diagnosis present

## 2021-06-29 HISTORY — PX: LUMBAR PERCUTANEOUS PEDICLE SCREW 2 LEVEL: SHX5561

## 2021-06-29 LAB — COMPREHENSIVE METABOLIC PANEL
ALT: 24 U/L (ref 0–44)
AST: 25 U/L (ref 15–41)
Albumin: 3.7 g/dL (ref 3.5–5.0)
Alkaline Phosphatase: 88 U/L (ref 38–126)
Anion gap: 10 (ref 5–15)
BUN: 16 mg/dL (ref 6–20)
CO2: 28 mmol/L (ref 22–32)
Calcium: 9.5 mg/dL (ref 8.9–10.3)
Chloride: 103 mmol/L (ref 98–111)
Creatinine, Ser: 0.96 mg/dL (ref 0.44–1.00)
GFR, Estimated: >60 mL/min (ref >60)
Glucose, Bld: 132 mg/dL — ABNORMAL HIGH (ref 70–99)
Potassium: 3.4 mmol/L — ABNORMAL LOW (ref 3.5–5.1)
Sodium: 141 mmol/L (ref 135–145)
Total Bilirubin: 0.8 mg/dL (ref 0.3–1.2)
Total Protein: 6.8 g/dL (ref 6.5–8.1)

## 2021-06-29 LAB — CBC
HCT: 52.2 % — ABNORMAL HIGH (ref 36.0–46.0)
Hemoglobin: 18.2 g/dL — ABNORMAL HIGH (ref 12.0–15.0)
MCH: 28.6 pg (ref 26.0–34.0)
MCHC: 34.9 g/dL (ref 30.0–36.0)
MCV: 81.9 fL (ref 80.0–100.0)
Platelets: 255 10*3/uL (ref 150–400)
RBC: 6.37 MIL/uL — ABNORMAL HIGH (ref 3.87–5.11)
RDW: 14.9 % (ref 11.5–15.5)
WBC: 9.7 10*3/uL (ref 4.0–10.5)
nRBC: 0 % (ref 0.0–0.2)

## 2021-06-29 LAB — GLUCOSE, CAPILLARY
Glucose-Capillary: 148 mg/dL — ABNORMAL HIGH (ref 70–99)
Glucose-Capillary: 138 mg/dL — ABNORMAL HIGH (ref 70–99)
Glucose-Capillary: 178 mg/dL — ABNORMAL HIGH (ref 70–99)
Glucose-Capillary: 188 mg/dL — ABNORMAL HIGH (ref 70–99)
Glucose-Capillary: 187 mg/dL — ABNORMAL HIGH (ref 70–99)

## 2021-06-29 LAB — TYPE AND SCREEN
ABO/RH(D): O POS
Antibody Screen: NEGATIVE

## 2021-06-29 LAB — SURGICAL PCR SCREEN
MRSA, PCR: POSITIVE — AB
Staphylococcus aureus: POSITIVE — AB

## 2021-06-29 SURGERY — OBLIQUE LUMBAR INTERBODY FUSION 1 LEVEL
Anesthesia: General | Site: Spine Lumbar | Laterality: Left

## 2021-06-29 MED ORDER — FLEET ENEMA 7-19 GM/118ML RE ENEM
1.0000 | ENEMA | Freq: Once | RECTAL | Status: DC | PRN
Start: 2021-06-29 — End: 2021-07-02

## 2021-06-29 MED ORDER — FLUTICASONE FUROATE-VILANTEROL 100-25 MCG/ACT IN AEPB
1.0000 | INHALATION_SPRAY | Freq: Every day | RESPIRATORY_TRACT | Status: DC
  Administered 2021-06-30 – 2021-07-02 (×3): 1 via RESPIRATORY_TRACT
  Filled 2021-06-29 (×2): qty 28

## 2021-06-29 MED ORDER — LIDOCAINE 2% (20 MG/ML) 5 ML SYRINGE
INTRAMUSCULAR | Status: DC | PRN
  Administered 2021-06-29: 80 mg via INTRAVENOUS

## 2021-06-29 MED ORDER — VANCOMYCIN HCL 1000 MG IV SOLR
INTRAVENOUS | Status: DC | PRN
  Administered 2021-06-29: 1000 mg

## 2021-06-29 MED ORDER — CALCIUM CARBONATE ANTACID 500 MG PO CHEW
400.0000 mg | CHEWABLE_TABLET | Freq: Four times a day (QID) | ORAL | Status: DC | PRN
  Administered 2021-06-29: 400 mg via ORAL
  Filled 2021-06-29: qty 2

## 2021-06-29 MED ORDER — MENTHOL 3 MG MT LOZG: 1.0000 | LOZENGE | OROMUCOSAL | Status: DC | PRN

## 2021-06-29 MED ORDER — PROPOFOL 10 MG/ML IV BOLUS
INTRAVENOUS | Status: DC | PRN
  Administered 2021-06-29: 160 mg via INTRAVENOUS
  Administered 2021-06-29 (×2): 20 mg via INTRAVENOUS

## 2021-06-29 MED ORDER — MIDAZOLAM HCL 2 MG/2ML IJ SOLN
INTRAMUSCULAR | Status: DC | PRN
  Administered 2021-06-29: 2 mg via INTRAVENOUS

## 2021-06-29 MED ORDER — BISACODYL 10 MG RE SUPP: 10.0000 mg | Freq: Every day | RECTAL | Status: DC | PRN

## 2021-06-29 MED ORDER — ONDANSETRON HCL 4 MG PO TABS
4.0000 mg | ORAL_TABLET | Freq: Four times a day (QID) | ORAL | Status: DC | PRN
Start: 2021-06-29 — End: 2021-07-02

## 2021-06-29 MED ORDER — FLUTICASONE PROPIONATE 50 MCG/ACT NA SUSP
2.0000 | Freq: Every day | NASAL | Status: DC
  Administered 2021-06-30 – 2021-07-02 (×3): 2 via NASAL
  Filled 2021-06-29 (×2): qty 16

## 2021-06-29 MED ORDER — CEFAZOLIN SODIUM-DEXTROSE 1-4 GM/50ML-% IV SOLN
1.0000 g | Freq: Three times a day (TID) | INTRAVENOUS | Status: AC
  Administered 2021-06-29 (×2): 1 g via INTRAVENOUS
  Filled 2021-06-29 (×2): qty 50

## 2021-06-29 MED ORDER — ALBUMIN HUMAN 5 % IV SOLN: INTRAVENOUS | Status: DC | PRN

## 2021-06-29 MED ORDER — LORATADINE 10 MG PO TABS
10.0000 mg | ORAL_TABLET | Freq: Every day | ORAL | Status: DC
  Administered 2021-06-30 – 2021-07-02 (×3): 10 mg via ORAL
  Filled 2021-06-29 (×3): qty 1

## 2021-06-29 MED ORDER — HYDROXYZINE PAMOATE 50 MG PO CAPS: 50.0000 mg | ORAL_CAPSULE | Freq: Three times a day (TID) | ORAL | Status: DC | PRN

## 2021-06-29 MED ORDER — ONDANSETRON HCL 4 MG/2ML IJ SOLN
4.0000 mg | Freq: Once | INTRAMUSCULAR | Status: AC | PRN
  Administered 2021-06-29: 4 mg via INTRAVENOUS

## 2021-06-29 MED ORDER — PHENYLEPHRINE 80 MCG/ML (10ML) SYRINGE FOR IV PUSH (FOR BLOOD PRESSURE SUPPORT)
PREFILLED_SYRINGE | INTRAVENOUS | Status: DC | PRN
  Administered 2021-06-29: 160 ug via INTRAVENOUS

## 2021-06-29 MED ORDER — POTASSIUM CHLORIDE CRYS ER 10 MEQ PO TBCR
20.0000 meq | EXTENDED_RELEASE_TABLET | Freq: Every day | ORAL | Status: DC
  Administered 2021-06-30 – 2021-07-02 (×3): 20 meq via ORAL
  Filled 2021-06-29 (×3): qty 2

## 2021-06-29 MED ORDER — DEXAMETHASONE SODIUM PHOSPHATE 10 MG/ML IJ SOLN
INTRAMUSCULAR | Status: DC | PRN
  Administered 2021-06-29: 5 mg via INTRAVENOUS

## 2021-06-29 MED ORDER — LIDOCAINE 2% (20 MG/ML) 5 ML SYRINGE
INTRAMUSCULAR | Status: AC
  Filled 2021-06-29: qty 5

## 2021-06-29 MED ORDER — NALOXONE HCL 4 MG/0.1ML NA LIQD: 1.0000 | NASAL | Status: DC | PRN

## 2021-06-29 MED ORDER — CHLORHEXIDINE GLUCONATE 0.12 % MT SOLN
15.0000 mL | Freq: Once | OROMUCOSAL | Status: AC
Start: 2021-06-29 — End: 2021-06-29
  Administered 2021-06-29: 15 mL via OROMUCOSAL
  Filled 2021-06-29: qty 15

## 2021-06-29 MED ORDER — BACLOFEN 10 MG PO TABS
10.0000 mg | ORAL_TABLET | Freq: Three times a day (TID) | ORAL | Status: DC
  Administered 2021-06-29 – 2021-07-02 (×9): 10 mg via ORAL
  Filled 2021-06-29 (×9): qty 1

## 2021-06-29 MED ORDER — SUFENTANIL CITRATE 50 MCG/ML IV SOLN
INTRAVENOUS | Status: AC
  Filled 2021-06-29: qty 1

## 2021-06-29 MED ORDER — INSULIN ASPART 100 UNIT/ML IJ SOLN
0.0000 [IU] | Freq: Three times a day (TID) | INTRAMUSCULAR | Status: DC
  Administered 2021-06-29: 4 [IU] via SUBCUTANEOUS
  Administered 2021-06-30: 3 [IU] via SUBCUTANEOUS
  Administered 2021-06-30 – 2021-07-02 (×5): 4 [IU] via SUBCUTANEOUS
  Administered 2021-07-02: 3 [IU] via SUBCUTANEOUS

## 2021-06-29 MED ORDER — THROMBIN 5000 UNITS EX SOLR
CUTANEOUS | Status: AC
  Filled 2021-06-29: qty 10000

## 2021-06-29 MED ORDER — CHLORHEXIDINE GLUCONATE CLOTH 2 % EX PADS: 6.0000 | MEDICATED_PAD | Freq: Once | CUTANEOUS | Status: DC

## 2021-06-29 MED ORDER — PROPOFOL 10 MG/ML IV BOLUS
INTRAVENOUS | Status: AC
  Filled 2021-06-29: qty 20

## 2021-06-29 MED ORDER — DEXAMETHASONE SODIUM PHOSPHATE 10 MG/ML IJ SOLN
INTRAMUSCULAR | Status: AC
  Filled 2021-06-29: qty 1

## 2021-06-29 MED ORDER — MUPIROCIN 2 % EX OINT
1.0000 "application " | TOPICAL_OINTMENT | Freq: Two times a day (BID) | CUTANEOUS | Status: DC
  Administered 2021-06-29 – 2021-07-02 (×7): 1 via NASAL
  Filled 2021-06-29 (×3): qty 22

## 2021-06-29 MED ORDER — METOLAZONE 2.5 MG PO TABS
2.5000 mg | ORAL_TABLET | Freq: Every day | ORAL | Status: DC
  Administered 2021-06-29 – 2021-07-02 (×4): 2.5 mg via ORAL
  Filled 2021-06-29 (×4): qty 1

## 2021-06-29 MED ORDER — VENLAFAXINE HCL ER 75 MG PO CP24
75.0000 mg | ORAL_CAPSULE | Freq: Every day | ORAL | Status: DC
  Administered 2021-06-30 – 2021-07-02 (×3): 75 mg via ORAL
  Filled 2021-06-29 (×3): qty 1

## 2021-06-29 MED ORDER — ONDANSETRON HCL 4 MG/2ML IJ SOLN
INTRAMUSCULAR | Status: AC
  Filled 2021-06-29: qty 2

## 2021-06-29 MED ORDER — POLYETHYLENE GLYCOL 3350 17 G PO PACK: 17.0000 g | PACK | Freq: Every day | ORAL | Status: DC | PRN

## 2021-06-29 MED ORDER — BUPIVACAINE HCL (PF) 0.25 % IJ SOLN
INTRAMUSCULAR | Status: AC
  Filled 2021-06-29: qty 30

## 2021-06-29 MED ORDER — SUCCINYLCHOLINE CHLORIDE 200 MG/10ML IV SOSY
PREFILLED_SYRINGE | INTRAVENOUS | Status: DC | PRN
  Administered 2021-06-29: 160 mg via INTRAVENOUS

## 2021-06-29 MED ORDER — THROMBIN (RECOMBINANT) 5000 UNITS EX SOLR
CUTANEOUS | Status: DC | PRN
  Administered 2021-06-29: 10 mL via TOPICAL

## 2021-06-29 MED ORDER — VANCOMYCIN HCL 1000 MG IV SOLR
INTRAVENOUS | Status: AC
  Filled 2021-06-29: qty 20

## 2021-06-29 MED ORDER — HYDROMORPHONE HCL 1 MG/ML IJ SOLN
INTRAMUSCULAR | Status: AC
  Filled 2021-06-29: qty 1

## 2021-06-29 MED ORDER — B COMPLEX-C PO TABS
1.0000 | ORAL_TABLET | Freq: Every day | ORAL | Status: DC
  Administered 2021-06-30 – 2021-07-02 (×3): 1 via ORAL
  Filled 2021-06-29 (×4): qty 1

## 2021-06-29 MED ORDER — BUSPIRONE HCL 5 MG PO TABS
10.0000 mg | ORAL_TABLET | Freq: Three times a day (TID) | ORAL | Status: DC
  Administered 2021-06-29 – 2021-07-02 (×9): 10 mg via ORAL
  Filled 2021-06-29 (×2): qty 2
  Filled 2021-06-29: qty 1
  Filled 2021-06-29: qty 2
  Filled 2021-06-29: qty 1
  Filled 2021-06-29 (×4): qty 2

## 2021-06-29 MED ORDER — SODIUM CHLORIDE 0.9 % IV SOLN: 250.0000 mL | INTRAVENOUS | Status: DC

## 2021-06-29 MED ORDER — ACETAMINOPHEN 325 MG PO TABS
650.0000 mg | ORAL_TABLET | ORAL | Status: DC | PRN
  Administered 2021-07-02: 650 mg via ORAL
  Filled 2021-06-29: qty 2

## 2021-06-29 MED ORDER — ONDANSETRON HCL 4 MG/2ML IJ SOLN
INTRAMUSCULAR | Status: DC | PRN
  Administered 2021-06-29: 4 mg via INTRAVENOUS

## 2021-06-29 MED ORDER — ONDANSETRON HCL 4 MG/2ML IJ SOLN: 4.0000 mg | Freq: Four times a day (QID) | INTRAMUSCULAR | Status: DC | PRN

## 2021-06-29 MED ORDER — HYDROCODONE-ACETAMINOPHEN 10-325 MG PO TABS
1.0000 | ORAL_TABLET | ORAL | Status: DC | PRN
  Administered 2021-07-01: 1 via ORAL
  Filled 2021-06-29: qty 1

## 2021-06-29 MED ORDER — INSULIN ASPART 100 UNIT/ML IJ SOLN
0.0000 [IU] | INTRAMUSCULAR | Status: DC | PRN
  Administered 2021-06-29: 2 [IU] via SUBCUTANEOUS

## 2021-06-29 MED ORDER — DILTIAZEM HCL ER COATED BEADS 120 MG PO CP24
120.0000 mg | ORAL_CAPSULE | Freq: Two times a day (BID) | ORAL | Status: DC
  Administered 2021-06-29 – 2021-07-02 (×6): 120 mg via ORAL
  Filled 2021-06-29 (×6): qty 1

## 2021-06-29 MED ORDER — MIDAZOLAM HCL 2 MG/2ML IJ SOLN
INTRAMUSCULAR | Status: AC
  Filled 2021-06-29: qty 2

## 2021-06-29 MED ORDER — SUCCINYLCHOLINE CHLORIDE 200 MG/10ML IV SOSY
PREFILLED_SYRINGE | INTRAVENOUS | Status: AC
  Filled 2021-06-29: qty 10

## 2021-06-29 MED ORDER — OXYCODONE HCL 5 MG PO TABS
10.0000 mg | ORAL_TABLET | ORAL | Status: DC | PRN
  Administered 2021-06-29 – 2021-07-02 (×6): 10 mg via ORAL
  Filled 2021-06-29 (×6): qty 2

## 2021-06-29 MED ORDER — CEFAZOLIN IN SODIUM CHLORIDE 3-0.9 GM/100ML-% IV SOLN
3.0000 g | INTRAVENOUS | Status: AC
  Administered 2021-06-29: 3 g via INTRAVENOUS
  Filled 2021-06-29: qty 100

## 2021-06-29 MED ORDER — PHENOL 1.4 % MT LIQD: 1.0000 | OROMUCOSAL | Status: DC | PRN

## 2021-06-29 MED ORDER — TAB-A-VITE/IRON PO TABS
1.0000 | ORAL_TABLET | Freq: Every day | ORAL | Status: DC
  Administered 2021-06-30 – 2021-07-02 (×3): 1 via ORAL
  Filled 2021-06-29 (×3): qty 1

## 2021-06-29 MED ORDER — SUFENTANIL CITRATE 50 MCG/ML IV SOLN
INTRAVENOUS | Status: DC | PRN
  Administered 2021-06-29 (×3): 10 ug via INTRAVENOUS
  Administered 2021-06-29: 20 ug via INTRAVENOUS

## 2021-06-29 MED ORDER — SODIUM CHLORIDE 0.9% FLUSH
3.0000 mL | INTRAVENOUS | Status: DC | PRN
Start: 2021-06-29 — End: 2021-07-02

## 2021-06-29 MED ORDER — SODIUM CHLORIDE 0.9% FLUSH
3.0000 mL | Freq: Two times a day (BID) | INTRAVENOUS | Status: DC
  Administered 2021-06-29 – 2021-07-02 (×4): 3 mL via INTRAVENOUS

## 2021-06-29 MED ORDER — PANTOPRAZOLE SODIUM 40 MG PO TBEC
40.0000 mg | DELAYED_RELEASE_TABLET | Freq: Every day | ORAL | Status: DC
  Administered 2021-06-30 – 2021-07-02 (×3): 40 mg via ORAL
  Filled 2021-06-29 (×3): qty 1

## 2021-06-29 MED ORDER — GABAPENTIN 300 MG PO CAPS
600.0000 mg | ORAL_CAPSULE | Freq: Three times a day (TID) | ORAL | Status: DC
  Administered 2021-06-29 – 2021-07-02 (×9): 600 mg via ORAL
  Filled 2021-06-29 (×9): qty 2

## 2021-06-29 MED ORDER — HYDROMORPHONE HCL 1 MG/ML IJ SOLN
0.2500 mg | INTRAMUSCULAR | Status: DC | PRN
  Administered 2021-06-29 (×2): 0.25 mg via INTRAVENOUS

## 2021-06-29 MED ORDER — OXYCODONE HCL 5 MG/5ML PO SOLN: 5.0000 mg | Freq: Once | ORAL | Status: DC | PRN

## 2021-06-29 MED ORDER — PROPOFOL 500 MG/50ML IV EMUL
INTRAVENOUS | Status: DC | PRN
  Administered 2021-06-29: 50 ug/kg/min via INTRAVENOUS

## 2021-06-29 MED ORDER — 0.9 % SODIUM CHLORIDE (POUR BTL) OPTIME
TOPICAL | Status: DC | PRN
  Administered 2021-06-29 (×2): 1000 mL

## 2021-06-29 MED ORDER — ORAL CARE MOUTH RINSE: 15.0000 mL | Freq: Once | OROMUCOSAL | Status: AC

## 2021-06-29 MED ORDER — OXYCODONE HCL 5 MG PO TABS: 5.0000 mg | ORAL_TABLET | Freq: Once | ORAL | Status: DC | PRN

## 2021-06-29 MED ORDER — CHLORHEXIDINE GLUCONATE CLOTH 2 % EX PADS
6.0000 | MEDICATED_PAD | Freq: Every day | CUTANEOUS | Status: DC
  Administered 2021-06-30 – 2021-07-02 (×2): 6 via TOPICAL

## 2021-06-29 MED ORDER — CALCIUM CARBONATE 1250 (500 CA) MG PO TABS
1250.0000 mg | ORAL_TABLET | Freq: Three times a day (TID) | ORAL | Status: DC
  Administered 2021-06-30 – 2021-07-02 (×8): 1250 mg via ORAL
  Filled 2021-06-29 (×10): qty 1

## 2021-06-29 MED ORDER — SODIUM CHLORIDE (PF) 0.9 % IJ SOLN
INTRAMUSCULAR | Status: AC
  Filled 2021-06-29: qty 10

## 2021-06-29 MED ORDER — BUPIVACAINE HCL (PF) 0.25 % IJ SOLN
INTRAMUSCULAR | Status: DC | PRN
  Administered 2021-06-29: 20 mL

## 2021-06-29 MED ORDER — INSULIN ASPART 100 UNIT/ML IJ SOLN
INTRAMUSCULAR | Status: AC
  Filled 2021-06-29: qty 1

## 2021-06-29 MED ORDER — HYDROMORPHONE HCL 1 MG/ML IJ SOLN
1.0000 mg | INTRAMUSCULAR | Status: DC | PRN
  Administered 2021-06-29 – 2021-07-02 (×6): 1 mg via INTRAVENOUS
  Filled 2021-06-29 (×7): qty 1

## 2021-06-29 MED ORDER — UMECLIDINIUM BROMIDE 62.5 MCG/ACT IN AEPB
1.0000 | INHALATION_SPRAY | Freq: Every day | RESPIRATORY_TRACT | Status: DC
  Administered 2021-06-30 – 2021-07-02 (×3): 1 via RESPIRATORY_TRACT
  Filled 2021-06-29 (×2): qty 7

## 2021-06-29 MED ORDER — HYDROXYZINE HCL 50 MG/ML IM SOLN
50.0000 mg | Freq: Four times a day (QID) | INTRAMUSCULAR | Status: DC | PRN
  Administered 2021-06-29: 50 mg via INTRAMUSCULAR
  Filled 2021-06-29: qty 1

## 2021-06-29 MED ORDER — ATORVASTATIN CALCIUM 40 MG PO TABS
40.0000 mg | ORAL_TABLET | Freq: Every day | ORAL | Status: DC
  Administered 2021-06-30 – 2021-07-02 (×3): 40 mg via ORAL
  Filled 2021-06-29 (×3): qty 1

## 2021-06-29 MED ORDER — ALBUTEROL SULFATE (2.5 MG/3ML) 0.083% IN NEBU: 3.0000 mL | INHALATION_SOLUTION | Freq: Four times a day (QID) | RESPIRATORY_TRACT | Status: DC | PRN

## 2021-06-29 MED ORDER — MONTELUKAST SODIUM 10 MG PO TABS
10.0000 mg | ORAL_TABLET | Freq: Every day | ORAL | Status: DC
  Administered 2021-06-29 – 2021-07-01 (×3): 10 mg via ORAL
  Filled 2021-06-29 (×3): qty 1

## 2021-06-29 MED ORDER — LINACLOTIDE 72 MCG PO CAPS
72.0000 ug | ORAL_CAPSULE | Freq: Every day | ORAL | Status: DC
  Administered 2021-06-30 – 2021-07-02 (×3): 72 ug via ORAL
  Filled 2021-06-29 (×4): qty 1

## 2021-06-29 MED ORDER — NYSTATIN 100000 UNIT/ML MT SUSP
5.0000 mL | Freq: Four times a day (QID) | OROMUCOSAL | Status: DC
  Administered 2021-06-29 – 2021-07-02 (×9): 500000 [IU] via ORAL
  Filled 2021-06-29 (×10): qty 5

## 2021-06-29 MED ORDER — LACTATED RINGERS IV SOLN: INTRAVENOUS | Status: DC

## 2021-06-29 MED ORDER — METHOCARBAMOL 750 MG PO TABS
750.0000 mg | ORAL_TABLET | Freq: Four times a day (QID) | ORAL | Status: DC | PRN
  Administered 2021-07-01 (×2): 750 mg via ORAL
  Filled 2021-06-29 (×2): qty 1

## 2021-06-29 MED ORDER — DIAZEPAM 5 MG PO TABS: 5.0000 mg | ORAL_TABLET | Freq: Four times a day (QID) | ORAL | Status: DC | PRN

## 2021-06-29 MED ORDER — DOCUSATE SODIUM 100 MG PO CAPS
200.0000 mg | ORAL_CAPSULE | Freq: Two times a day (BID) | ORAL | Status: DC
  Administered 2021-06-29 – 2021-06-30 (×3): 200 mg via ORAL
  Filled 2021-06-29 (×5): qty 2

## 2021-06-29 MED ORDER — ACETAMINOPHEN 650 MG RE SUPP: 650.0000 mg | RECTAL | Status: DC | PRN

## 2021-06-29 SURGICAL SUPPLY — 74 items
ADH SKN CLS APL DERMABOND .7 (GAUZE/BANDAGES/DRESSINGS) ×2
APL SKNCLS STERI-STRIP NONHPOA (GAUZE/BANDAGES/DRESSINGS) ×4
BAG COUNTER SPONGE SURGICOUNT (BAG) ×3 IMPLANT
BAG DECANTER FOR FLEXI CONT (MISCELLANEOUS) ×3 IMPLANT
BAG SPNG CNTER NS LX DISP (BAG) ×2
BENZOIN TINCTURE PRP APPL 2/3 (GAUZE/BANDAGES/DRESSINGS) ×4 IMPLANT
BLADE CLIPPER SURG (BLADE) IMPLANT
BONE MATRIX OSTEOCEL PRO LRG (Bone Implant) ×1 IMPLANT
BUR MATCHSTICK NEURO 3.0 LAGG (BURR) ×1 IMPLANT
CARTRIDGE OIL MAESTRO DRILL (MISCELLANEOUS) ×2 IMPLANT
CLIP NEUROVISION LG (CLIP) ×1 IMPLANT
CNTNR URN SCR LID CUP LEK RST (MISCELLANEOUS) ×2 IMPLANT
CONN O-H RELINE 6.0/6.35 (Connector) ×6 IMPLANT
CONNECTOR O-H RELINE 6.0/6.35 (Connector) IMPLANT
CONT SPEC 4OZ STRL OR WHT (MISCELLANEOUS) ×3
COVER BACK TABLE 24X17X13 BIG (DRAPES) IMPLANT
COVER BACK TABLE 60X90IN (DRAPES) ×3 IMPLANT
DERMABOND ADVANCED (GAUZE/BANDAGES/DRESSINGS) ×1
DERMABOND ADVANCED .7 DNX12 (GAUZE/BANDAGES/DRESSINGS) IMPLANT
DIFFUSER DRILL AIR PNEUMATIC (MISCELLANEOUS) ×3 IMPLANT
DRAPE C-ARM 42X72 X-RAY (DRAPES) ×6 IMPLANT
DRAPE C-ARMOR (DRAPES) ×3 IMPLANT
DRAPE LAPAROTOMY 100X72X124 (DRAPES) ×4 IMPLANT
DRAPE SURG 17X23 STRL (DRAPES) ×12 IMPLANT
DRSG OPSITE POSTOP 4X8 (GAUZE/BANDAGES/DRESSINGS) ×1 IMPLANT
ELECT BLADE 4.0 EZ CLEAN MEGAD (MISCELLANEOUS) ×3
ELECT REM PT RETURN 9FT ADLT (ELECTROSURGICAL) ×6
ELECTRODE BLDE 4.0 EZ CLN MEGD (MISCELLANEOUS) IMPLANT
ELECTRODE REM PT RTRN 9FT ADLT (ELECTROSURGICAL) ×2 IMPLANT
EVACUATOR 1/8 PVC DRAIN (DRAIN) IMPLANT
GAUZE 4X4 16PLY ~~LOC~~+RFID DBL (SPONGE) IMPLANT
GAUZE SPONGE 4X4 12PLY STRL (GAUZE/BANDAGES/DRESSINGS) ×2 IMPLANT
GLOVE BIO SURGEON STRL SZ 6 (GLOVE) ×2 IMPLANT
GLOVE BIO SURGEON STRL SZ 6.5 (GLOVE) ×1 IMPLANT
GLOVE BIO SURGEON STRL SZ7 (GLOVE) ×1 IMPLANT
GLOVE BIOGEL PI IND STRL 6.5 (GLOVE) IMPLANT
GLOVE BIOGEL PI IND STRL 7.0 (GLOVE) IMPLANT
GLOVE BIOGEL PI IND STRL 7.5 (GLOVE) IMPLANT
GLOVE BIOGEL PI INDICATOR 6.5 (GLOVE) ×3
GLOVE BIOGEL PI INDICATOR 7.0 (GLOVE) ×3
GLOVE BIOGEL PI INDICATOR 7.5 (GLOVE) ×2
GLOVE ECLIPSE 9.0 STRL (GLOVE) ×5 IMPLANT
GLOVE EXAM NITRILE XL STR (GLOVE) IMPLANT
GOWN STRL REUS W/ TWL LRG LVL3 (GOWN DISPOSABLE) IMPLANT
GOWN STRL REUS W/ TWL XL LVL3 (GOWN DISPOSABLE) ×2 IMPLANT
GOWN STRL REUS W/TWL 2XL LVL3 (GOWN DISPOSABLE) IMPLANT
GOWN STRL REUS W/TWL LRG LVL3 (GOWN DISPOSABLE) ×9
GOWN STRL REUS W/TWL XL LVL3 (GOWN DISPOSABLE) ×15
GUIDEWIRE NITINOL BEVEL TIP (WIRE) ×2 IMPLANT
KIT BASIN OR (CUSTOM PROCEDURE TRAY) ×3 IMPLANT
KIT DILATOR XLIF 5 (KITS) IMPLANT
KIT SURGICAL ACCESS MAXCESS 4 (KITS) ×1 IMPLANT
KIT TURNOVER KIT B (KITS) ×3 IMPLANT
KIT XLIF (KITS) ×1
MODULE NVM5 NEXT GEN EMG (NEEDLE) ×1 IMPLANT
MODULUS XLW 12X22X55MM 10 (Spine Construct) ×1 IMPLANT
NDL I-PASS III (NEEDLE) IMPLANT
NEEDLE HYPO 22GX1.5 SAFETY (NEEDLE) ×4 IMPLANT
NEEDLE I-PASS III (NEEDLE) ×3 IMPLANT
NS IRRIG 1000ML POUR BTL (IV SOLUTION) ×4 IMPLANT
OIL CARTRIDGE MAESTRO DRILL (MISCELLANEOUS) ×3
PACK LAMINECTOMY NEURO (CUSTOM PROCEDURE TRAY) ×4 IMPLANT
POD ARM15T 80MM (Rod) ×2 IMPLANT
SCREW LOCK RELINE 5.5 TULIP (Screw) ×4 IMPLANT
SCREW MAS RELINE 6.5X45 POLY (Screw) ×2 IMPLANT
SPONGE SURGIFOAM ABS GEL SZ50 (HEMOSTASIS) ×1 IMPLANT
SPONGE T-LAP 4X18 ~~LOC~~+RFID (SPONGE) IMPLANT
SUT ETHILON 2 0 PSLX (SUTURE) ×1 IMPLANT
SUT VIC AB 2-0 CT1 18 (SUTURE) ×6 IMPLANT
SUT VIC AB 3-0 SH 8-18 (SUTURE) ×6 IMPLANT
TOWEL GREEN STERILE (TOWEL DISPOSABLE) ×3 IMPLANT
TOWEL GREEN STERILE FF (TOWEL DISPOSABLE) ×4 IMPLANT
TRAY FOLEY MTR SLVR 16FR STAT (SET/KITS/TRAYS/PACK) ×1 IMPLANT
WATER STERILE IRR 1000ML POUR (IV SOLUTION) ×3 IMPLANT

## 2021-06-29 NOTE — Anesthesia Postprocedure Evaluation (Signed)
Anesthesia Post Note  Patient: CHIYO FAY  Procedure(s) Performed: Anterolateral Interbody Fusion - left - Lumbar one-Lumbar two with bilateral nuvasive perc screws need connectors to tie into existing hardware (Left: Spine Lumbar) LUMBAR PERCUTANEOUS PEDICLE SCREW LUMBAR ONE-TWO (Spine Lumbar)     Patient location during evaluation: PACU Anesthesia Type: General Level of consciousness: awake and alert and oriented Pain management: pain level controlled Vital Signs Assessment: post-procedure vital signs reviewed and stable Respiratory status: spontaneous breathing, nonlabored ventilation and respiratory function stable Cardiovascular status: blood pressure returned to baseline and stable Postop Assessment: no apparent nausea or vomiting Anesthetic complications: no   No notable events documented.  Last Vitals:  Vitals:   06/29/21 1246 06/29/21 1301  BP: (!) 156/80 (!) 150/76  Pulse: 81 84  Resp: 19 18  Temp:    SpO2: 95% 97%    Last Pain:  Vitals:   06/29/21 1246  TempSrc:   PainSc: Asleep                 Dashonna Chagnon A.

## 2021-06-29 NOTE — H&P (Signed)
[name] is an 56 y.o. female.   Chief Complaint: Weakness HPI: 56 year old female status post extensive cervical thoracic and lumbar surgery presents with increasing weakness and gait instability with radiating pain and numbness in both lower extremities.  Work-up demonstrates evidence of prior decompression and fusion surgery from L2-L5.  She has had prior posterior decompression from L1 throughout her lower thoracic spine.  She has severe adjacent level degeneration and recurrent stenosis at L1-L2 with marked thecal sac compression.  Patient presents now for left-sided L1-L2 indirect decompression and fusion via anterior lateral retroperitoneal interbody decompression and fusion with posterior percutaneous pedicle screw fixation.    Past Medical History:  Diagnosis Date   Alcoholism ([place])    Anxiety    Arthritis 04/10/2011   hips, shoulders, back   Asthma    Bipolar disorder ([place])    Cardiomegaly    Carpal tunnel syndrome, bilateral    Cervical cancer ([place]) 1993   cervical, no treatment done, went away per pt   Cervical dysplasia or atypia 04/10/2011   '93- once dx.-got pregnant-no intervention, then postpartum, no dysplasia found   CHF (congestive heart failure) ([place])    no cardiologist 2014 dx, none now   Chronic hypoxemic respiratory failure ([place])    Chronic kidney disease    stage 3 kidney disease   Condyloma - gluteal cleft 04/09/2011   Removed by general surgery. Pathology showed Condyloma, gluteal CONDYLOMA ACUMINATUM.    COPD (chronic obstructive pulmonary disease) ([place])    Depression    Diabetes mellitus without complication ([place])    no meds today   Dyspnea    with actity, sitting   Fall 12/16/2016   Fibrocystic breast disease    GERD (gastroesophageal reflux disease)    Grade I diastolic dysfunction 81/85/6314   Noted on ECHO   Hepatitis    hep B-count is low at present,doesn't register   History of PSVT (paroxysmal supraventricular tachycardia)     Hypercalcemia    Hyperlipidemia    Hypertension    Incomplete left bundle branch block (LBBB) 09/24/2018   Noted on EKG   Lung nodule 11/2017   right and left lung   Migraine headache    none recent   Morbid obesity ([place]) 03/27/2006   Neuropathy    Pneumonia    walking pneu 15 yrs. ago   Skin lesion 03/15/2011   In gluteal crease now s/p removal by Dr. [name] of General Surgery on 3/12. Path shows condyloma.      Sleep apnea 04/10/2011   does not use after loss   Trigger finger    left third   Urge incontinence of urine     Past Surgical History:  Procedure Laterality Date   ANAL FISTULECTOMY  04/17/2011   Procedure: FISTULECTOMY ANAL;  Surgeon: Imogene Burn. [name], MD;  Location: [facility];  Service: General;  Laterality: N/A;  Excision of Condyloma Gluteal Cleft    ANAL RECTAL MANOMETRY N/A 03/26/2019   Procedure: ANO RECTAL MANOMETRY;  Surgeon: Thornton Park, MD;  [facility];  Service: Gastroenterology;  Laterality: N/A;   BACK SURGERY  2009   pt thinks it was an "interbody fusion"   BIOPSY  03/09/2019   Procedure: BIOPSY;  Surgeon: Thornton Park, MD;  Location: [facility];  Service: Gastroenterology;;   BOTOX INJECTION N/A 12/29/2018   Procedure: BOTOX INJECTION(100 UNITS)  WITH CYSTOSCOPY;  Surgeon: Ceasar Mons, MD;  [facility];  Service: Urology;  Laterality: N/A;   BOTOX INJECTION N/A  07/27/2019   Procedure: BOTOX INJECTION WITH CYSTOSCOPY;  Surgeon: Ceasar Mons, MD;  [facility];  Service: Urology;  Laterality: N/A;  ONLY NEEDS 30 MIN   BOTOX INJECTION N/A 05/02/2021   Procedure: BOTOX INJECTION WITH CYSTOSCOPY;  Surgeon: Ceasar Mons, MD;  Location: [facility];  Service: Urology;  Laterality: N/A;  ONLY NEEDS 30 MIN   BREAST CYST EXCISION     bilateral breast, 3 cysts removed from each breast   BREAST EXCISIONAL BIOPSY Right    x 3   BREAST EXCISIONAL BIOPSY Left    x 3   CARPAL  TUNNEL RELEASE Bilateral    Cervical biospy     CERVICAL FUSION  04/10/2011   x3- cervical fusion with plating and screws-Dr. [name]   COLONOSCOPY WITH PROPOFOL N/A 07/06/2015   Procedure: COLONOSCOPY WITH PROPOFOL;  Surgeon: [name], MD;  [facility];  Service: Endoscopy;  Laterality: N/A;   COLONOSCOPY WITH PROPOFOL N/A 03/09/2019   Procedure: COLONOSCOPY WITH PROPOFOL;  Surgeon: Thornton Park, MD;  [facility];  Service: Gastroenterology;  Laterality: N/A;   DOPPLER ECHOCARDIOGRAPHY  04/06/2012   AT [facility] 55-60%   FOOT SURGERY Left 2018   FRACTURE SURGERY     little toe left foot   I & D EXTREMITY Left 12/18/2016   Procedure: IRRIGATION AND DEBRIDEMENT LEFT FOOT, CLOSURE;  Surgeon: [name], MD;  Location: [facility];  Service: Orthopedics;  Laterality: Left;   INJECTION KNEE Right 10/03/2020   Procedure: KNEE INJECTION;  Surgeon: Jessy Oto, MD;  Location: [facility];  Service: Orthopedics;  Laterality: Right;   KNEE ARTHROSCOPY WITH MEDIAL MENISECTOMY Right 03/21/2021   Procedure: RIGHT KNEE ARTHROSCOPY WITH MEDIAL AND PARTIAL LATERAL MENISCECTOMY;  Surgeon: [name], MD;  Location: [facility];  Service: Orthopedics;  Laterality: Right;   LAPAROSCOPIC GASTRIC SLEEVE RESECTION N/A 05/08/2020   Procedure: LAPAROSCOPIC GASTRIC SLEEVE RESECTION;  Surgeon: Kieth Brightly Arta Bruce, MD;  [facility];  Service: General;  Laterality: N/A;   [place] SURGERY  04/10/2011   x5-Lumbar fusion-retained hardware.(Dr. Louanne Skye)   LUMBAR LAMINECTOMY N/A 10/03/2020   Procedure: THORACOLUMBAR LAMINECTOMIES THORACIC ELEVEN-TWELVE, THORACIC TWELVE-LUMBAR ONE  AND LUMBAT ONE-TWO;  Surgeon: Jessy Oto, MD;  Location: [facility];  Service: Orthopedics;  Laterality: N/A;   MULTIPLE TOOTH EXTRACTIONS     POLYPECTOMY  03/09/2019   Procedure: POLYPECTOMY;  Surgeon: Thornton Park, MD;  [facility];  Service: Gastroenterology;;   SHOULDER  ARTHROSCOPY WITH ROTATOR CUFF REPAIR AND SUBACROMIAL DECOMPRESSION Left 05/07/2019   Procedure: LEFT SHOULDER ARTHROSCOPY WITH DEBRIDEMENT, DISTAL CLAVICLE EXCISION,  SUBACROMIAL DECOMPRESSION AND POSSIBLE ROTATOR CUFF REPAIR;  Surgeon: [name], MD;  Location: [facility] [place];  Service: Orthopedics;  Laterality: Left;   TEE WITHOUT CARDIOVERSION N/A 04/06/2012   Procedure: TRANSESOPHAGEAL ECHOCARDIOGRAM (TEE);  Surgeon: [name], MD;  Location: [facility];  Service: Cardiovascular;  Laterality: N/A;   TRIGGER FINGER RELEASE Right    middle finger   TRIGGER FINGER RELEASE Left 06/28/2016   Procedure: RELEASE TRIGGER FINGER LEFT 3RD FINGER;  Surgeon: [name], MD;  Location: [facility];  Service: Orthopedics;  Laterality: Left;   TRIGGER FINGER RELEASE Right 06/12/2016   Procedure: RIGHT INDEX FINGER TRIGGER RELEASE;  Surgeon: [name], MD;  Location: [place];  Service: Orthopedics;  Laterality: Right;   TRIGGER FINGER RELEASE Right 01/19/2018   Procedure: RIGHT RING FINGER TRIGGER FINGER RELEASE;  Surgeon: Frankey Shown  M, MD;  Location: [place] [facility];  Service: Orthopedics;  Laterality: Right;   TRIGGER FINGER RELEASE Left 05/07/2019   Procedure: RELEASE TRIGGER FINGER LEFT INDEX FINGER;  Surgeon: [name], MD;  Location: [facility];  Service: Orthopedics;  Laterality: Left;   UPPER GI ENDOSCOPY N/A 05/08/2020   Procedure: UPPER GI ENDOSCOPY;  Surgeon: Kieth Brightly, Arta Bruce, MD;  [facility];  Service: General;  Laterality: N/A;    Family History  Problem Relation Age of Onset   Pulmonary embolism Mother    Stroke Father    Alcohol abuse Father    Pneumonia Father    Hypertension Father    Breast cancer Maternal Aunt    Hypertension Other    Diabetes Other        Aunts and cousins   Asthma Sister    Depression Sister    Arthritis Sister    Sickle cell anemia Son    Social History:  reports that she has been smoking cigarettes. She started smoking about 44 years ago. She has a  40.00 pack-year smoking history. She has never used smokeless tobacco. She reports that she does not currently use drugs after having used the following drugs: Marijuana and "Crack" cocaine. She reports that she does not drink alcohol.  Allergies:  Allergies  Allergen Reactions   Aspirin Other (See Comments)    Kidney disease   Methadone Hcl Other (See Comments)     "blacked out" in 1990's   Levofloxacin Itching    Medications Prior to Admission  Medication Sig Dispense Refill   acetaminophen (TYLENOL) 500 MG tablet Take 1,000 mg by mouth 2 (two) times daily.     albuterol (VENTOLIN HFA) 108 (90 Base) MCG/ACT inhaler TAKE 2 PUFFS BY MOUTH EVERY 6 HOURS AS NEEDED FOR WHEEZE OR SHORTNESS OF BREATH 8.5 each 3   ammonium lactate (LAC-HYDRIN) 12 % lotion APPLY TWICE A DAY AS NEEDED FOR DRY SKIN 400 mL 3   atorvastatin (LIPITOR) 40 MG tablet Take 1 tablet (40 mg total) by mouth daily. 90 tablet 3   baclofen (LIORESAL) 10 MG tablet Take 1 tablet (10 mg total) by mouth 3 (three) times daily as needed for muscle spasms. (Patient taking differently: Take 10 mg by mouth 3 (three) times daily.) 90 each 0   busPIRone (BUSPAR) 10 MG tablet TAKE 1 TABLET BY MOUTH THREE TIMES A DAY 270 tablet 4   calcium carbonate (OSCAL) 1500 (600 Ca) MG TABS tablet Take 1,500 mg by mouth 3 (three) times daily with meals.     diclofenac Sodium (VOLTAREN) 1 % GEL APPLY TO AFFECTED AREA AS DIRECTED 500 g 1   diltiazem (CARDIZEM CD) 120 MG 24 hr capsule TAKE 1 CAPSULE (120 MG TOTAL) BY MOUTH 2 (TWO) TIMES DAILY. 180 capsule 2   fluticasone (FLONASE) 50 MCG/ACT nasal spray PLACE 2 SPRAYS INTO BOTH NOSTRILS TWICE DAILY 48 mL 33   Fluticasone-Umeclidin-Vilant (TRELEGY ELLIPTA) 100-62.5-25 MCG/ACT AEPB Inhale 1 puff into the lungs daily. 60 each 3   gabapentin (NEURONTIN) 300 MG capsule Take 2 capsules (600 mg total) by mouth 3 (three) times daily. TAKE 2 CAPSULES (600 MG TOTAL) BY MOUTH in the morning and at lunch and take 3  capsules at night 540 capsule 2   hydrOXYzine (VISTARIL) 50 MG capsule Take 1 capsule (50 mg total) by mouth 3 (three) times daily as needed. 30 capsule 0   linaclotide (LINZESS) 72 MCG capsule Take 72 mcg by mouth daily before breakfast.  loratadine (CLARITIN) 10 MG tablet Take 1 tablet (10 mg total) by mouth daily. 30 tablet 11   methocarbamol (ROBAXIN) 750 MG tablet Take 1 tablet (750 mg total) by mouth every 6 (six) hours as needed for muscle spasms. 120 tablet 2   metolazone (ZAROXOLYN) 2.5 MG tablet TAKE 1 TABLET EVERY OTHER DAY. TAKE 1 TABLET TWICE WEEKLY ON MONDAYS AND THURSDAYS ONLY 45 tablet 7   montelukast (SINGULAIR) 10 MG tablet TAKE 1 TABLET BY MOUTH EVERYDAY AT BEDTIME 90 tablet 3   Multiple Vitamins-Minerals (BARIATRIC MULTIVITAMINS/IRON) CAPS Take 1 capsule by mouth daily.     nystatin (MYCOSTATIN) 100000 UNIT/ML suspension TAKE 5 MLS (500,000 UNITS TOTAL) BY MOUTH 4 (FOUR) TIMES DAILY. SWISH AND SWALLOW. 60 mL 3   omeprazole (PRILOSEC) 20 MG capsule TAKE 1 CAPSULE BY MOUTH EVERY DAY 90 capsule 3   Potassium Chloride ER 20 MEQ TBCR Take 20 mEq by mouth daily.     venlafaxine XR (EFFEXOR-XR) 75 MG 24 hr capsule TAKE 1 CAPSULE BY MOUTH DAILY WITH BREAKFAST. 90 capsule 2   b complex vitamins capsule Take 1 capsule by mouth daily.     blood glucose meter kit and supplies KIT Dispense based on patient and insurance preference. Use up to four times daily as directed. (FOR ICD-9 250.00, 250.01). 1 each 0   docusate sodium (COLACE) 100 MG capsule Take 200 mg by mouth 2 (two) times daily.     ketoconazole (NIZORAL) 2 % cream Apply 1 application. topically daily. 15 g 0   NARCAN 4 MG/0.1ML LIQD nasal spray kit Place 1 spray into the nose as needed (accidental overdose).      Results for orders placed or performed during the hospital encounter of 06/29/21 (from the past 48 hour(s))  Type and screen     Status: None (Preliminary result)   Collection Time: 06/29/21  6:45 AM  Result Value  Ref Range   ABO/RH(D) PENDING    Antibody Screen PENDING    Sample Expiration      07/02/2021,2359 Performed at [facility], [place] [address], [place], [place] [address]   Comprehensive metabolic panel per protocol     Status: Abnormal   Collection Time: 06/29/21  6:58 AM  Result Value Ref Range   Sodium 141 135 - 145 mmol/L   Potassium 3.4 (L) 3.5 - 5.1 mmol/L   Chloride 103 98 - 111 mmol/L   CO2 28 22 - 32 mmol/L   Glucose, Bld 132 (H) 70 - 99 mg/dL    Comment: Glucose reference range applies only to samples taken after fasting for at least 8 hours.   BUN 16 6 - 20 mg/dL   Creatinine, Ser 0.96 0.44 - 1.00 mg/dL   Calcium 9.5 8.9 - 10.3 mg/dL   Total Protein 6.8 6.5 - 8.1 g/dL   Albumin 3.7 3.5 - 5.0 g/dL   AST 25 15 - 41 U/L   ALT 24 0 - 44 U/L   Alkaline Phosphatase 88 38 - 126 U/L   Total Bilirubin 0.8 0.3 - 1.2 mg/dL   GFR, Estimated >60 >60 mL/min    Comment: (NOTE) Calculated using the CKD-EPI Creatinine Equation (2021)    Anion gap 10 5 - 15    Comment: Performed at [facility] [address] [place], [place] [address]  CBC per protocol     Status: Abnormal   Collection Time: 06/29/21  6:58 AM  Result Value Ref Range   WBC 9.7 4.0 -  10.5 K/uL   RBC 6.37 (H) 3.87 - 5.11 MIL/uL   Hemoglobin 18.2 (H) 12.0 - 15.0 g/dL   HCT 52.2 (H) 36.0 - 46.0 %   MCV 81.9 80.0 - 100.0 fL   MCH 28.6 26.0 - 34.0 pg   MCHC 34.9 30.0 - 36.0 g/dL   RDW 14.9 11.5 - 15.5 %   Platelets 255 150 - 400 K/uL   nRBC 0.0 0.0 - 0.2 %    Comment: Performed at [facility] [address], [place], [place] [address]   *Note: Due to a large number of results and/or encounters for the requested time period, some results have not been displayed. A complete set of results can be found in Results Review.   No results found.  Pertinent items noted in HPI and remainder of comprehensive ROS otherwise negative.  Blood pressure (!) 163/90, pulse 86, temperature 98.2 F (36.8 C),  temperature source Oral, resp. rate 18, height '5\' 9"'  (1.753 m), weight 129.4 kg, last menstrual period 04/10/2006, SpO2 94 %.  Patient is awake and alert.  She is oriented and appropriate.  Speech is fluent.  Judgment insight are intact.  Cranial nerve function normal bilateral.  Motor examination with 4/5 strength in both lower extremities.  Patient is still ambulatory however.  She has poor motor control and coordination with her lower extremities.  She has decreased sensation from her waist down bilaterally.  Reflexes are hypoactive but symmetric.  No evidence of long track signs.  Examination head ears eyes nose and throat cervical chest and abdomen benign.  Extremities are free of major deformity. Assessment/Plan L1-L2 severe stenosis with neurogenic claudication and cauda equina dysfunction.  Plan left-sided L1-L2 anterior lateral retroperitoneal interbody decompression and fusion utilizing interbody cage and allograft.  This to be augmented with posterior pedicle screw fixation from L1 tying into her old instrumentation.  Risks and benefits of been explained.  Patient wished to proceed.  [name] 06/29/2021, 7:46 AM

## 2021-06-29 NOTE — Assessment & Plan Note (Addendum)
Patient scheduled for procedure for lumbar radiculopathy tomorrow.  Is in need of home health services after the procedure.  Order placed for home health PT as well as nursing aide for after the procedure.  No further issues or concerns at this time.

## 2021-06-29 NOTE — Transfer of Care (Signed)
Immediate Anesthesia Transfer of Care Note  Patient: [name]  Procedure(s) Performed: Anterolateral Interbody Fusion - left - Lumbar one-Lumbar two with bilateral nuvasive perc screws need connectors to tie into existing hardware (Left: Spine Lumbar) LUMBAR PERCUTANEOUS PEDICLE SCREW LUMBAR ONE-TWO (Spine Lumbar)  Patient Location: PACU  Anesthesia Type:General  Level of Consciousness: drowsy and patient cooperative  Airway & Oxygen Therapy: Patient Spontanous Breathing and Patient connected to nasal cannula oxygen  Post-op Assessment: Report given to RN, Post -op Vital signs reviewed and stable and Patient moving all extremities  Post vital signs: Reviewed and stable  Last Vitals:  Vitals Value Taken Time  BP 155/84 06/29/21 1218  Temp 36.9 C 06/29/21 1218  Pulse 83 06/29/21 1221  Resp 16 06/29/21 1221  SpO2 95 % 06/29/21 1221  Vitals shown include unvalidated device data.  Last Pain:  Vitals:   06/29/21 0700  TempSrc:   PainSc: 10-Worst pain ever      Patients Stated Pain Goal: 3 (02/27/41 8887)  Complications: No notable events documented.

## 2021-06-29 NOTE — Progress Notes (Signed)
Orthopedic Tech Progress Note Patient Details:  [name] [birth year] [id number]  Patient ID: [name], female   DOB: [birth year], 56 y.o.   MRN: [id number] Patient already has brace.  [name] 06/29/2021, 4:15 PM

## 2021-06-29 NOTE — Anesthesia Procedure Notes (Signed)
Procedure Name: Intubation Date/Time: 06/29/2021 8:14 AM Performed by: Moshe Salisbury, CRNA Pre-anesthesia Checklist: Patient identified, Emergency Drugs available, Suction available and Patient being monitored Patient Re-evaluated:Patient Re-evaluated prior to induction Oxygen Delivery Method: Circle System Utilized Preoxygenation: Pre-oxygenation with 100% oxygen Induction Type: IV induction Ventilation: Mask ventilation without difficulty Laryngoscope Size: Mac and 3 Grade View: Grade I Tube type: Oral Tube size: 7.5 mm Number of attempts: 1 Airway Equipment and Method: Stylet Placement Confirmation: ETT inserted through vocal cords under direct vision, positive ETCO2 and breath sounds checked- equal and bilateral Secured at: 21 cm Tube secured with: Tape Dental Injury: Teeth and Oropharynx as per pre-operative assessment

## 2021-06-29 NOTE — Op Note (Signed)
Date of procedure: 06/29/2021  Date of dictation: Same  Service: Neurosurgery  Preoperative diagnosis: L1-L2 instability with stenosis and neurogenic claudication  Postoperative diagnosis: Same  Procedure Name: Left L1-L2 retroperitoneal anterior lateral interbody decompression and fusion utilizing interbody cage and morselized allograft  L1-L2 posterior lumbar fusion utilizing nonsegmental pedicle screw fixation and local autograft  Surgeon:Akirah Storck A.Romie Keeble, M.D.  Asst. Surgeon: Reinaldo Meeker, NP  Anesthesia: General  Indication: Patient is status post extensive cervical thoracic and lumbar surgery by other physicians.  The patient has history of prior lower thoracic and upper lumbar decompressive surgery.  She is status post prior L2-L5 decompression and fusion.  She presents now with severe back and bilateral lower extremity weakness.  Work-up demonstrates evidence of severe adjacent level disc degeneration with instability and marked vacuum phenomenon at L1-2 with associated stenosis.  Patient presents now for decompression and fusion at L1-2 in hopes of improving her symptoms.  Operative note: After induction of anesthesia, patient position in the right lateral decubitus position and appropriately padded.  Patient's left flank was prepped and draped sterilely.  Incision was made overlying L1-L2 on the left side.  A secondary incision was made the patient's posterior flank.  Blunt dissection was made through the posterior flank into the retroperitoneal space.  Peritoneal sac and contents were swept anteriorly and a dilator was then passed through the lateral incision docking with the left L1-L2 interspace.  Intraoperative neural monitoring was used to confirm good position of the dilator and relative safety apart from the lumbar plexus.  The dilator was sequentially increased stimulating along the way.  A self-retaining retractor was placed.  Once again position was confirmed to be safe with  stimulation.  Shim was then impacted into the disc space at L1 to.  The distractor was widened and the psoas muscle was dissected free from the lateral disc space.  The disc space itself was stimulated and found to be free of any traversing neural structures.  Discectomy was then performed by incising the disc and then cleaning the disc with various pituitaries and curettes.  The disc space was sequentially dilated up to 12 mm.  A 12 mm implant was found to be the best fit.  This gave good decompression indirectly to the canal.  A 12 mm x 55 mm x 22 mm NuVasive printed 3D titanium cage was then packed with Osteocel plus allograft.  This then impacted into place and confirmed to be in good position both the AP and lateral views.  The retractor was removed.  Hemostasis was found to be good.  Both wounds were closed in typical fashion.  The patient was then positioned prone and the lumbar region was prepped and draped once again.  Incision was then made from L1 down to L3.  Dissection was performed bilaterally.  Retractor was placed.  Fluoroscopy was used and levels were confirmed.  Previously placed pedicle screws rotation from L2 distally was dissected free.  A transverse connector was dissected free and removed.  The rods were cleaned of soft tissue and surrounding bone for placement of a connector.  The connector was secured around the rods between the screw heads at L2 and L3.  This was done bilaterally.  Pedicle was then identified using surface landmarks and intraoperative fluoroscopy.  A Jamshidi needle introducer was then placed over a pedicle entry site and then impacted into place into the vertebral body of L1.  This was confirmed to be in good position in both AP and lateral plane  and once again neural monitoring was used to confirm safe passage.  6.5 x 45 mm NuVasive screw was then placed bilaterally and L1.  This was confirmed with final images being good position.  Short segment titanium rods and placed  over the screw heads from L1 to the connector.  This was then attached using locking caps.  Locking caps were then all given a final tightening.  The construct was then confirmed to be solid in good position.  Wound was then irrigated.  Vancomycin powder was placed in deep wound space.  Wounds then closed in layers with Vicryl sutures.  Steri-Strips and sterile dressing were applied.  No apparent complications.  Patient tolerated the procedure well and she returns to the recovery room postop.

## 2021-06-29 NOTE — Brief Op Note (Signed)
06/29/2021  09:81 PM  PATIENT:  [name]  56 y.o. female  PRE-OPERATIVE DIAGNOSIS:  Stenosis  POST-OPERATIVE DIAGNOSIS:  Stenosis  PROCEDURE:  Procedure(s): Anterolateral Interbody Fusion - left - Lumbar one-Lumbar two with bilateral nuvasive perc screws need connectors to tie into existing hardware (Left) LUMBAR PERCUTANEOUS PEDICLE SCREW LUMBAR ONE-TWO  SURGEON:  Surgeon(s) and Role:    * Earnie Larsson, MD - Primary  PHYSICIAN ASSISTANT:   ASSISTANTSMearl Latin   ANESTHESIA:   general  EBL:  600 mL   BLOOD ADMINISTERED:none  DRAINS: none   LOCAL MEDICATIONS USED:  MARCAINE     SPECIMEN:  No Specimen  DISPOSITION OF SPECIMEN:  N/A  COUNTS:  YES  TOURNIQUET:  * No tourniquets in log *  DICTATION: .Dragon Dictation  PLAN OF CARE: Admit to inpatient   PATIENT DISPOSITION:  PACU - hemodynamically stable.   Delay start of Pharmacological VTE agent (>24hrs) due to surgical blood loss or risk of bleeding: yes

## 2021-06-30 LAB — GLUCOSE, CAPILLARY
Glucose-Capillary: 159 mg/dL — ABNORMAL HIGH (ref 70–99)
Glucose-Capillary: 153 mg/dL — ABNORMAL HIGH (ref 70–99)
Glucose-Capillary: 144 mg/dL — ABNORMAL HIGH (ref 70–99)
Glucose-Capillary: 225 mg/dL — ABNORMAL HIGH (ref 70–99)

## 2021-06-30 LAB — HEMOGLOBIN A1C
Hgb A1c MFr Bld: 6.9 % — ABNORMAL HIGH (ref 4.8–5.6)
Mean Plasma Glucose: 151.33 mg/dL

## 2021-06-30 NOTE — Care Management (Signed)
Patient requests bedside commode for home. Earnie Larsson, MD said it would be okay to order.

## 2021-06-30 NOTE — Progress Notes (Signed)
O2 stats in 50's; pt was sat up and given 2L of oxygen O2 stats increased to 80's. Pt O2 increased to 4L and put on continuous pulse; O2 is now 90-93. Pt says she has history of COPD but does not use CPAP. On call MD notified.

## 2021-06-30 NOTE — Evaluation (Signed)
Occupational Therapy Evaluation Patient Details Name: [name] MRN: [id number] DOB: 1965-11-09 Today's Date: 06/30/2021   History of Present Illness Pt s a 56 yo female s/p left L1-L2 retroperitoneal anterior lateral interbody decompression and fusion  and L1-L2 posterior lumbar fusion.PMHxsignificant for but not limited to: anxiety, arthritis, bipolar disorder, CKD stage 3, COPD, depression, DM2, alcoholism, cervical fusion 2013, L shoulder surgery 2021,T11-12, T12-L1, L1-2 laminectomies and R knee injection.   Clinical Impression   This 56 yo female admitted and underwent above presents to acute OT with PLOF of being totally independent with all basic ADLs and most IADLs. Currently she is at an overall level of setup/S-min A due pain and back precautions. She will benefit from continued acute OT without need for followup .      Recommendations for follow up therapy are one component of a multi-disciplinary discharge planning process, led by the attending physician.  Recommendations may be updated based on patient status, additional functional criteria and insurance authorization.   Follow Up Recommendations  No OT follow up    Assistance Recommended at Discharge PRN  Patient can return home with the following A little help with walking and/or transfers;A little help with bathing/dressing/bathroom;Assist for transportation;Assistance with cooking/housework    Functional Status Assessment  Patient has had a recent decline in their functional status and demonstrates the ability to make significant improvements in function in a reasonable and predictable amount of time.  Equipment Recommendations  None recommended by OT       Precautions / Restrictions Precautions Precautions: Fall Required Braces or Orthoses: Spinal Brace Spinal Brace: Applied in sitting position Restrictions Weight Bearing Restrictions: No      Mobility Bed Mobility               General bed mobility  comments: up in recliner    Transfers Overall transfer level: Needs assistance Equipment used: Rolling walker (2 wheels) Transfers: Sit to/from Stand Sit to Stand: Min guard                  Balance Overall balance assessment: Mild deficits observed, not formally tested                                         ADL either performed or assessed with clinical judgement   ADL Overall ADL's : Needs assistance/impaired Eating/Feeding: Independent;Sitting   Grooming: Set up;Sitting   Upper Body Bathing: Set up;Sitting   Lower Body Bathing: Minimal assistance Lower Body Bathing Details (indicate cue type and reason): min guard A sit<>stand Upper Body Dressing : Set up;Sitting   Lower Body Dressing: Minimal assistance Lower Body Dressing Details (indicate cue type and reason): min guard A sit<>stand; while seated in Toilet Transfer: Min Psychiatric nurse Details (indicate cue type and reason): sit<>stand and taking a couple of steps forward and back                 Vision Patient Visual Report: No change from baseline              Pertinent Vitals/Pain Pain Assessment Pain Assessment: 0-10 Pain Score: 8  Pain Location: lower spine Pain Descriptors / Indicators: Aching, Sore Pain Intervention(s): Limited activity within patient's tolerance, Monitored during session, Repositioned     Hand Dominance Right   Extremity/Trunk Assessment Upper Extremity Assessment Upper Extremity Assessment: Overall WFL for tasks assessed  Communication Communication Communication: No difficulties   Cognition Arousal/Alertness: Awake/alert Behavior During Therapy: WFL for tasks assessed/performed Overall Cognitive Status: Within Functional Limits for tasks assessed                                                  Home Living Family/patient expects to be discharged to:: Private residence Living Arrangements: Alone Available Help  at Discharge: Personal care attendant (has some help in the evenings) Type of Home: House Home Access: Stairs to enter   Entrance Stairs-Rails: Left Home Layout: One level     Bathroom Shower/Tub: Teacher, early years/pre: Handicapped height     Home Equipment: Rollator (4 wheels)          Prior Functioning/Environment Prior Level of Function : Independent/Modified Independent             Mobility Comments: pt reports furniture walking within her house but using rollator when going outside of her house          OT Problem List: Decreased range of motion;Impaired balance (sitting and/or standing);Obesity;Pain      OT Treatment/Interventions: Self-care/ADL training;DME and/or AE instruction;Patient/family education;Balance training    OT Goals(Current goals can be found in the care plan section) Acute Rehab OT Goals Patient Stated Goal: maybe home tomorrow OT Goal Formulation: With patient Time For Goal Achievement: 07/14/21 Potential to Achieve Goals: Good  OT Frequency: Min 2X/week       AM-PAC OT "6 Clicks" Daily Activity     Outcome Measure Help from another person eating meals?: None Help from another person taking care of personal grooming?: A Little Help from another person toileting, which includes using toliet, bedpan, or urinal?: A Little Help from another person bathing (including washing, rinsing, drying)?: A Little Help from another person to put on and taking off regular upper body clothing?: A Little Help from another person to put on and taking off regular lower body clothing?: A Little 6 Click Score: 19   End of Session Equipment Utilized During Treatment: Rolling walker (2 wheels);Back brace  Activity Tolerance: Patient tolerated treatment well Patient left: in chair;with call bell/phone within reach;with chair alarm set  OT Visit Diagnosis: Unsteadiness on feet (R26.81);Other abnormalities of gait and mobility (R26.89);Muscle  weakness (generalized) (M62.81);Pain Pain - part of body:  (incisional)                Time: 9977-4142 OT Time Calculation (min): 17 min Charges:  OT General Charges $OT Visit: 1 Visit OT Evaluation $OT Eval Moderate Complexity: Martha Lake, OTR/L Acute [employer] [facility] [phone] Office [phone]    Almon Register 06/30/2021, 12:57 PM

## 2021-06-30 NOTE — Progress Notes (Signed)
Subjective: Patient reports  condition of back pain denies any pain in her legs  Objective: Vital signs in last 24 hours: Temp:  [97.8 F (36.6 C)-98.8 F (37.1 C)] 98.7 F (37.1 C) (06/03 0819) Pulse Rate:  [81-109] 89 (06/03 0819) Resp:  [16-21] 16 (06/03 0819) BP: (128-187)/(67-105) 143/82 (06/03 0819) SpO2:  [89 %-100 %] 95 % (06/03 0819)  Intake/Output from previous day: 06/02 0701 - 06/03 0700 In: 1350 [I.V.:1000; IV Piggyback:350] Out: 1250 [Urine:600; Blood:650] Intake/Output this shift: No intake/output data recorded.  Strength 5 out of 5 incisions clean dry and intact  Lab Results: Recent Labs    06/29/21 0658  WBC 9.7  HGB 18.2*  HCT 52.2*  PLT 255   BMET Recent Labs    06/29/21 0658  NA 141  K 3.4*  CL 103  CO2 28  GLUCOSE 132*  BUN 16  CREATININE 0.96  CALCIUM 9.5    Studies/Results: DG Lumbar Spine 2-3 Views  Result Date: 06/29/2021 CLINICAL DATA:  Anterolateral interbody fusion L1-2 EXAM: LUMBAR SPINE - 2-3 VIEW COMPARISON:  Lumbar spine CT 05/25/2021 FINDINGS: Two intraoperative views of the lumbar spine demonstrate the posterolateral rod and pedicle screw apparatus noted to extend from the L2 level caudad. There is a new intervertebral spacer at the L1-2 level along with bilateral pedicle screws at L1. No complicating feature observed. IMPRESSION: 1. Interval pedicle screw placement at L1 with interbody spacer at L1-2. Electronically Signed   By: Van Clines M.D.   On: 06/29/2021 11:50   DG C-Arm 1-60 Min-No Report  Result Date: 06/29/2021 Fluoroscopy was utilized by the requesting physician.  No radiographic interpretation.   DG C-Arm 1-60 Min-No Report  Result Date: 06/29/2021 Fluoroscopy was utilized by the requesting physician.  No radiographic interpretation.   DG C-Arm 1-60 Min-No Report  Result Date: 06/29/2021 Fluoroscopy was utilized by the requesting physician.  No radiographic interpretation.     Assessment/Plan: 56 year old female postop day 1 anterior lateral interbody fusion with posterior pedicle screw supplementation.  Patient with appropriate postoperative soreness in her back.  Mobilize in her brace with physical Occupational Therapy  LOS: 1 day     Elaina Hoops 06/30/2021, 8:48 AM

## 2021-06-30 NOTE — Evaluation (Signed)
Physical Therapy Evaluation Patient Details Name: [name] MRN: [id number] DOB: 09/28/1965 Today's Date: 06/30/2021  History of Present Illness  Pt s a 56 yo female s/p left L1-L2 retroperitoneal anterior lateral interbody decompression and fusion  and L1-L2 posterior lumbar fusion.PMHxsignificant for but not limited to: anxiety, arthritis, bipolar disorder, CKD stage 3, COPD, depression, DM2, alcoholism, cervical fusion 2013, L shoulder surgery 2021,T11-12, T12-L1, L1-2 laminectomies and R knee injection.  Clinical Impression  Patient is s/p above surgery resulting in the deficits listed below (see PT Problem List).  Patient will benefit from skilled PT to increase their independence and safety with mobility (while adhering to their precautions) to allow discharge to the venue listed below.   Of note pts back dressing was saturated with blood and nursing replaced it. Pt also decreased to 82% 02 sats after walking short distance on room air. Replaced O2 at 3L and it increased to 94% after about 2 minutes.      Recommendations for follow up therapy are one component of a multi-disciplinary discharge planning process, led by the attending physician.  Recommendations may be updated based on patient status, additional functional criteria and insurance authorization.  Follow Up Recommendations Home health PT    Assistance Recommended at Discharge Intermittent Supervision/Assistance  Patient can return home with the following       Equipment Recommendations None recommended by PT  Recommendations for Other Services       Functional Status Assessment Patient has had a recent decline in their functional status and demonstrates the ability to make significant improvements in function in a reasonable and predictable amount of time.     Precautions / Restrictions Precautions Precautions: Fall Required Braces or Orthoses: Spinal Brace Spinal Brace: Applied in sitting  position Restrictions Weight Bearing Restrictions: No      Mobility  Bed Mobility Overal bed mobility: Needs Assistance Bed Mobility: Supine to Sit     Supine to sit: Min assist, HOB elevated (Pt used rail as well)          Transfers Overall transfer level: Needs assistance Equipment used: Rolling walker (2 wheels) Transfers: Sit to/from Stand Sit to Stand: Min guard                Ambulation/Gait Ambulation/Gait assistance: Counsellor (Feet): 15 Feet Assistive device: Rolling walker (2 wheels) Gait Pattern/deviations: Step-through pattern, Decreased stride length Gait velocity: slow     General Gait Details: no loss of balance  Stairs            Wheelchair Mobility    Modified Rankin (Stroke Patients Only)       Balance Overall balance assessment: Mild deficits observed, not formally tested                                           Pertinent Vitals/Pain Pain Assessment Pain Assessment: 0-10 Pain Score: 8  Pain Location: lower spine Pain Descriptors / Indicators: Aching Pain Intervention(s): Premedicated before session, Monitored during session    Home Living Family/patient expects to be discharged to:: Private residence Living Arrangements: Alone Available Help at Discharge: Personal care attendant (Has some help in the evenings) Type of Home: House Home Access: Stairs to enter       Home Layout: One level Home Equipment: Rollator (4 wheels) (rollator likely has elevated hand rests)      Prior Function  Prior Level of Function : Independent/Modified Independent             Mobility Comments: pt reports furniture walking within her house but using rollator when going outside of her house       Hand Dominance   Dominant Hand: Right    Extremity/Trunk Assessment   Upper Extremity Assessment Upper Extremity Assessment: Defer to OT evaluation    Lower Extremity Assessment Lower Extremity  Assessment: Generalized weakness    Cervical / Trunk Assessment Cervical / Trunk Assessment: Back Surgery (in spinal brace)  Communication   Communication: No difficulties  Cognition Arousal/Alertness: Awake/alert (became drowzy towards end of session) Behavior During Therapy: WFL for tasks assessed/performed, Flat affect Overall Cognitive Status: Within Functional Limits for tasks assessed                                          General Comments General comments (skin integrity, edema, etc.): No family present.  Nursing in room at start of session to pass meds and replace dressing that was saturated in blood.    Exercises     Assessment/Plan    PT Assessment Patient needs continued PT services  PT Problem List Decreased strength;Decreased activity tolerance;Decreased balance;Decreased mobility;Decreased knowledge of use of DME;Decreased knowledge of precautions;Pain       PT Treatment Interventions DME instruction;Gait training;Stair training;Therapeutic exercise;Patient/family education    PT Goals (Current goals can be found in the Care Plan section)  Acute Rehab PT Goals Patient Stated Goal: to get better at walking PT Goal Formulation: With patient Time For Goal Achievement: 07/07/21 Potential to Achieve Goals: Good    Frequency Min 5X/week     Co-evaluation               AM-PAC PT "6 Clicks" Mobility  Outcome Measure Help needed turning from your back to your side while in a flat bed without using bedrails?: A Little Help needed moving from lying on your back to sitting on the side of a flat bed without using bedrails?: A Little Help needed moving to and from a bed to a chair (including a wheelchair)?: A Little Help needed standing up from a chair using your arms (e.g., wheelchair or bedside chair)?: A Little Help needed to walk in hospital room?: A Little Help needed climbing 3-5 steps with a railing? : A Lot 6 Click Score: 17    End  of Session Equipment Utilized During Treatment: Gait belt;Back brace Activity Tolerance: Patient limited by fatigue Patient left: in chair;with call bell/phone within reach Nurse Communication: Mobility status PT Visit Diagnosis: Unsteadiness on feet (R26.81)    Time: 3094-0768 PT Time Calculation (min) (ACUTE ONLY): 40 min   Charges:   PT Evaluation $PT Eval Moderate Complexity: 1 Mod PT Treatments $Gait Training: 23-37 mins        Lavonia Dana, PT   Acute Rehabilitation Services  Office [phone] 06/30/2021   Melvern Banker 06/30/2021, 12:35 PM

## 2021-07-01 LAB — GLUCOSE, CAPILLARY
Glucose-Capillary: 145 mg/dL — ABNORMAL HIGH (ref 70–99)
Glucose-Capillary: 157 mg/dL — ABNORMAL HIGH (ref 70–99)
Glucose-Capillary: 185 mg/dL — ABNORMAL HIGH (ref 70–99)
Glucose-Capillary: 172 mg/dL — ABNORMAL HIGH (ref 70–99)

## 2021-07-01 MED ORDER — MAGNESIUM HYDROXIDE 400 MG/5ML PO SUSP
30.0000 mL | Freq: Every day | ORAL | Status: DC | PRN
  Administered 2021-07-01: 30 mL via ORAL
  Filled 2021-07-01: qty 30

## 2021-07-01 MED FILL — Thrombin For Soln 5000 Unit: CUTANEOUS | Qty: 2 | Status: AC

## 2021-07-01 NOTE — Progress Notes (Signed)
Physical Therapy Treatment Patient Details Name: [name] MRN: [id number] DOB: [birth year] Today's Date: 07/01/2021   History of Present Illness Pt s a 56 yo female s/p left L1-L2 retroperitoneal anterior lateral interbody decompression and fusion  and L1-L2 posterior lumbar fusion.PMHxsignificant for but not limited to: anxiety, arthritis, bipolar disorder, CKD stage 3, COPD, depression, DM2, alcoholism, cervical fusion 2013, L shoulder surgery 2021,T11-12, T12-L1, L1-2 laminectomies and R knee injection.    PT Comments    Pt making steady progress overall with functional mobility. She tolerated further distances with ambulation today and required less assistance. Of note, pt was on supplemental O2 via [place] (2L) upon PT arrival with SpO2 in the high 90's. PT removed supplemental O2, with SpO2 maintaining in mid 90's at rest. However, with activity pt's SpO2 decreasing to 85% with slow recovery to 88% with pursed-lip breathing. PT reapplied supplemental O2 with SpO2 increasing to mid 90's. Pt would continue to benefit from skilled physical therapy services at this time while admitted and after d/c to address the below listed limitations in order to improve overall safety and independence with functional mobility.   Recommendations for follow up therapy are one component of a multi-disciplinary discharge planning process, led by the attending physician.  Recommendations may be updated based on patient status, additional functional criteria and insurance authorization.  Follow Up Recommendations  Home health PT     Assistance Recommended at Discharge Intermittent Supervision/Assistance  Patient can return home with the following A little help with bathing/dressing/bathroom   Equipment Recommendations  None recommended by PT    Recommendations for Other Services       Precautions / Restrictions Precautions Precautions: Fall;Back Required Braces or Orthoses: Spinal Brace Spinal Brace:  Applied in sitting position Restrictions Weight Bearing Restrictions: No     Mobility  Bed Mobility               General bed mobility comments: pt OOB in recliner upon PT arrival    Transfers Overall transfer level: Needs assistance Equipment used: Rolling walker (2 wheels) Transfers: Sit to/from Stand Sit to Stand: Min guard           General transfer comment: min guard for safety, good technique utilized    Ambulation/Gait Ambulation/Gait assistance: Counsellor (Feet): 100 Feet Assistive device: Rolling walker (2 wheels) Gait Pattern/deviations: Step-through pattern, Decreased stride length Gait velocity: decreased     General Gait Details: pt with slow, cautious but steady gait overall with use of RW, min guard for safety, no LOB   Stairs             Wheelchair Mobility    Modified Rankin (Stroke Patients Only)       Balance Overall balance assessment: Mild deficits observed, not formally tested                                          Cognition Arousal/Alertness: Awake/alert Behavior During Therapy: WFL for tasks assessed/performed Overall Cognitive Status: Within Functional Limits for tasks assessed                                          Exercises      General Comments        Pertinent Vitals/Pain Pain Assessment Pain Assessment:  0-10 Pain Score: 9  Pain Location: lower spine Pain Descriptors / Indicators: Aching, Sore Pain Intervention(s): Monitored during session, Repositioned, Patient requesting pain meds-RN notified    Home Living                          Prior Function            PT Goals (current goals can now be found in the care plan section) Acute Rehab PT Goals PT Goal Formulation: With patient Time For Goal Achievement: 07/07/21 Potential to Achieve Goals: Good Progress towards PT goals: Progressing toward goals    Frequency    Min  5X/week      PT Plan Current plan remains appropriate    Co-evaluation              AM-PAC PT "6 Clicks" Mobility   Outcome Measure  Help needed turning from your back to your side while in a flat bed without using bedrails?: A Little Help needed moving from lying on your back to sitting on the side of a flat bed without using bedrails?: A Little Help needed moving to and from a bed to a chair (including a wheelchair)?: A Little Help needed standing up from a chair using your arms (e.g., wheelchair or bedside chair)?: A Little Help needed to walk in hospital room?: A Little Help needed climbing 3-5 steps with a railing? : A Little 6 Click Score: 18    End of Session Equipment Utilized During Treatment: Back brace Activity Tolerance: Patient tolerated treatment well Patient left: in chair;with call bell/phone within reach Nurse Communication: Mobility status;Patient requests pain meds PT Visit Diagnosis: Unsteadiness on feet (R26.81)     Time: 8546-2703 PT Time Calculation (min) (ACUTE ONLY): 16 min  Charges:  $Gait Training: 8-22 mins                     Anastasio Champion, DPT  Acute Rehabilitation Services Office [place] 07/01/2021, 10:12 AM

## 2021-07-01 NOTE — Progress Notes (Signed)
Pt request milk of magnesia for constipation. Pt informed that orders have been placed for a dulcolax suppository, miralax, or a fleet enema. Pt declined these meds for constipation and said she prefers milk of magnesia. Provider paged, see new order.

## 2021-07-01 NOTE — Progress Notes (Signed)
Subjective: Patient reports  condition of back pain but also pain in her neck and shoulders.  She says she usually uses Voltaren cream for this and they were not giving it to her and I explained that we usually try to recommend they stay away from nonsteroidal anti-inflammatories during the acute postoperative fusion recovery.  Objective: Vital signs in last 24 hours: Temp:  [97.7 F (36.5 C)-98.4 F (36.9 C)] 98.3 F (36.8 C) (06/04 0808) Pulse Rate:  [79-85] 85 (06/04 0808) Resp:  [16-18] 16 (06/04 0808) BP: (133-148)/(64-88) 134/64 (06/04 0808) SpO2:  [94 %-100 %] 99 % (06/04 0808)  Intake/Output from previous day: No intake/output data recorded. Intake/Output this shift: No intake/output data recorded.  Strength 5 out of 5 wound clean dry and intact with a's very small amount of drainage  Lab Results: Recent Labs    06/29/21 0658  WBC 9.7  HGB 18.2*  HCT 52.2*  PLT 255   BMET Recent Labs    06/29/21 0658  NA 141  K 3.4*  CL 103  CO2 28  GLUCOSE 132*  BUN 16  CREATININE 0.96  CALCIUM 9.5    Studies/Results: DG Lumbar Spine 2-3 Views  Result Date: 06/29/2021 CLINICAL DATA:  Anterolateral interbody fusion L1-2 EXAM: LUMBAR SPINE - 2-3 VIEW COMPARISON:  Lumbar spine CT 05/25/2021 FINDINGS: Two intraoperative views of the lumbar spine demonstrate the posterolateral rod and pedicle screw apparatus noted to extend from the L2 level caudad. There is a new intervertebral spacer at the L1-2 level along with bilateral pedicle screws at L1. No complicating feature observed. IMPRESSION: 1. Interval pedicle screw placement at L1 with interbody spacer at L1-2. Electronically Signed   By: Van Clines M.D.   On: 06/29/2021 11:50   DG C-Arm 1-60 Min-No Report  Result Date: 06/29/2021 Fluoroscopy was utilized by the requesting physician.  No radiographic interpretation.   DG C-Arm 1-60 Min-No Report  Result Date: 06/29/2021 Fluoroscopy was utilized by the requesting  physician.  No radiographic interpretation.   DG C-Arm 1-60 Min-No Report  Result Date: 06/29/2021 Fluoroscopy was utilized by the requesting physician.  No radiographic interpretation.    Assessment/Plan: Postop day 2 anterior lateral interbody fusion with posterior augmentation.  Small amount of wound drainage but incision otherwise looks very good.  Continue to mobilize with physical outpatient therapy patient does not feel like her strength and/or pain is under well enouigh control to be discharged today.  LOS: 2 days     Elaina Hoops 07/01/2021, 9:03 AM

## 2021-07-01 NOTE — Progress Notes (Signed)
Occupational Therapy Treatment Patient Details Name: [name] MRN: [id number] DOB: Feb 26, 1965 Today's Date: 07/01/2021   History of present illness Pt s a 56 yo female s/p left L1-L2 retroperitoneal anterior lateral interbody decompression and fusion  and L1-L2 posterior lumbar fusion.PMHxsignificant for but not limited to: anxiety, arthritis, bipolar disorder, CKD stage 3, COPD, depression, DM2, alcoholism, cervical fusion 2013, L shoulder surgery 2021,T11-12, T12-L1, L1-2 laminectomies and R knee injection.   OT comments  Patient progressing very well towards OT goals, and this therapist does not anticipate that she will require many more OT sessions. Pt stood x 2 from recliner for ADLs and return to bed for IV nurse to work with her, both times with supervision. Pt able to return to supine with supervision only and verbalizes realistic plans for caring for herself at home including being able to simulate use of her reacher for LE dressing and verbalize how to safely navigate the kitchen while using her RW including sliding items along counter tops vs use of walker bag or tray. Patient remains limited by spinal precautions with pain, generalized weakness and decreased activity tolerance along with deficits noted below. Pt continues to demonstrate good rehab potential and would benefit from continued skilled OT to increase safety and independence with ADLs and functional transfers to allow pt to return home safely and reduce caregiver burden and fall risk.    Recommendations for follow up therapy are one component of a multi-disciplinary discharge planning process, led by the attending physician.  Recommendations may be updated based on patient status, additional functional criteria and insurance authorization.    Follow Up Recommendations  No OT follow up    Assistance Recommended at Discharge PRN  Patient can return home with the following  A little help with walking and/or transfers;A  little help with bathing/dressing/bathroom;Assist for transportation;Assistance with cooking/housework   Equipment Recommendations  None recommended by OT    Recommendations for Other Services      Precautions / Restrictions Precautions Precautions: Fall;Back Required Braces or Orthoses: Spinal Brace Spinal Brace: Applied in sitting position Restrictions Weight Bearing Restrictions: No       Mobility Bed Mobility Overal bed mobility: Needs Assistance Bed Mobility: Sit to Supine       Sit to supine: Supervision   General bed mobility comments: Cues for spinal precautions with pt following well using a modified reverse log roll to supine motion.    Transfers                         Balance Overall balance assessment: Mild deficits observed, not formally tested                                         ADL either performed or assessed with clinical judgement   ADL Overall ADL's : Needs assistance/impaired     Grooming: Set up;Sitting;Standing Grooming Details (indicate cue type and reason): Pt stood to begin then completed in sitting cleaning of her dentures with setup of denture cup and cleaning tablet. Pt used tooth brush to brush dentures clean. Pt wiped face and removed [place] to do so with desat to mid 41s. Pt instructed to don [place] and SpO2 quick recovery to mid-90s.       Lower Body Bathing Details (indicate cue type and reason): Pt reports having a long handled bath brush and a tub  bench as well as a hand held shower nozzle. [name] Kitchen Upper Body Dressing : Set up;Sitting     Lower Body Dressing Details (indicate cue type and reason): Pt reports that she has a raecher and was able to vebalize and simulate use of reach to don underwear over feet. Pt reports that she does not wear socks, has slip on shoes, and usually goes without underwear, wearing easy overhead tank dresses. Toilet Transfer: Copy Details (indicate cue  type and reason): Pt stood from recliner x 2 with supervision.  Pt pivoted from recliner to EOB with supervision, declined RW and just touched furniture to steady self. Pt descended to EOB with supervision.         Functional mobility during ADLs: Supervision/safety      Extremity/Trunk Assessment Upper Extremity Assessment Upper Extremity Assessment: Overall WFL for tasks assessed       Cervical / Trunk Assessment Cervical / Trunk Assessment: Back Surgery (LSO brace)    Vision   Vision Assessment?: No apparent visual deficits   Perception     Praxis      Cognition Arousal/Alertness: Awake/alert Behavior During Therapy: WFL for tasks assessed/performed Overall Cognitive Status: Within Functional Limits for tasks assessed                                 General Comments: Very good understanding of "BLT" but needs cues at times to follow them.        Exercises      Shoulder Instructions       General Comments      Pertinent Vitals/ Pain       Pain Assessment Pain Assessment: Faces Faces Pain Scale: Hurts a little bit Pain Location: lower spine Pain Descriptors / Indicators: Aching, Sore Pain Intervention(s): Limited activity within patient's tolerance, Monitored during session, Premedicated before session  Home Living                                          Prior Functioning/Environment              Frequency  Min 2X/week        Progress Toward Goals  OT Goals(current goals can now be found in the care plan section)  Progress towards OT goals: Progressing toward goals  Acute Rehab OT Goals Patient Stated Goal: to go home and have assisatnce from an [place]. OT Goal Formulation: With patient Time For Goal Achievement: 07/14/21 Potential to Achieve Goals: Good  Plan Discharge plan remains appropriate    Co-evaluation                 AM-PAC OT "6 Clicks" Daily Activity     Outcome Measure   Help from  another person eating meals?: None Help from another person taking care of personal grooming?: A Little Help from another person toileting, which includes using toliet, bedpan, or urinal?: A Little Help from another person bathing (including washing, rinsing, drying)?: A Little Help from another person to put on and taking off regular upper body clothing?: A Little Help from another person to put on and taking off regular lower body clothing?: A Little 6 Click Score: 19    End of Session Equipment Utilized During Treatment: Back brace  OT Visit Diagnosis: Unsteadiness on feet (R26.81);Other abnormalities of gait and mobility (  R26.89);Muscle weakness (generalized) (M62.81);Pain Pain - part of body:  (incision)   Activity Tolerance Patient tolerated treatment well   Patient Left in bed;with call bell/phone within reach   Nurse Communication Mobility status        Time: 1444-5848 OT Time Calculation (min): 28 min  Charges: OT General Charges $OT Visit: 1 Visit OT Treatments $Self Care/Home Management : 8-22 mins $Therapeutic Activity: 8-22 mins  [name], [place] Office: [phone] 07/01/2021  [name] 07/01/2021, 1:05 PM

## 2021-07-02 ENCOUNTER — Encounter (HOSPITAL_COMMUNITY): Payer: Self-pay | Admitting: Neurosurgery

## 2021-07-02 LAB — GLUCOSE, CAPILLARY
Glucose-Capillary: 164 mg/dL — ABNORMAL HIGH (ref 70–99)
Glucose-Capillary: 140 mg/dL — ABNORMAL HIGH (ref 70–99)

## 2021-07-02 MED ORDER — OXYCODONE-ACETAMINOPHEN 10-325 MG PO TABS: 1.0000 | ORAL_TABLET | ORAL | 0 refills | Status: DC | PRN

## 2021-07-02 NOTE — Progress Notes (Signed)
Physical Therapy Treatment Patient Details Name: [name] MRN: [id number] DOB: [birth year] Today's Date: 07/02/2021   History of Present Illness Pt s a 56 yo female s/p left L1-L2 retroperitoneal anterior lateral interbody decompression and fusion  and L1-L2 posterior lumbar fusion.PMHxsignificant for but not limited to: anxiety, arthritis, bipolar disorder, CKD stage 3, COPD, depression, DM2, alcoholism, cervical fusion 2013, L shoulder surgery 2021,T11-12, T12-L1, L1-2 laminectomies and R knee injection.    PT Comments    Pt demos improved independence with sit > stand and ambulation in room.  Continues to need min A to don lumbar brace.  Tx terminated prior to ambulation in hall and stairway training secondary to pt c/o pain and wanting pain medicine prior to activity.  NSG contacted and PT to return after pt receives meds to complete treatment.  Recommendations for follow up therapy are one component of a multi-disciplinary discharge planning process, led by the attending physician.  Recommendations may be updated based on patient status, additional functional criteria and insurance authorization.  Follow Up Recommendations  Home health PT     Assistance Recommended at Discharge PRN  Patient can return home with the following Assistance with cooking/housework;Assist for transportation;Help with stairs or ramp for entrance   Equipment Recommendations  Rolling walker (2 wheels);BSC/3in1    Recommendations for Other Services       Precautions / Restrictions Precautions Precautions: Fall;Back Required Braces or Orthoses: Spinal Brace Spinal Brace: Applied in sitting position;Lumbar corset Restrictions Weight Bearing Restrictions: No     Mobility  Bed Mobility                 Patient Response: Cooperative  Transfers Overall transfer level: Modified independent Equipment used: Rolling walker (2 wheels) Transfers: Sit to/from Stand, Bed to chair/wheelchair/BSC Sit  to Stand: Modified independent (Device/Increase time)           General transfer comment: Pt performs sit > stand from chair and on and off commode with mod I and use of RW.  Able to mostly don spinal brace independently but brace is noted to be slightly off center and PT asisst in straightening brace.    Ambulation/Gait Ambulation/Gait assistance: Modified independent (Device/Increase time) Gait Distance (Feet): 40 Feet Assistive device: Rolling walker (2 wheels)         General Gait Details: Pt amb to bathroom to bathroom and to door of room with mod I.  Once at door of room pt states that she realized she hasn't taken her pain meds yet and wants them before completing any more PT.  PT encouarges pt to do the stair training first and then call for meds, but pt requests to return back to chair now.  Pt amb back to chair and removes brace with mod I.   Stairs             Wheelchair Mobility    Modified Rankin (Stroke Patients Only)       Balance Overall balance assessment: Modified Independent                                          Cognition Arousal/Alertness: Awake/alert Behavior During Therapy: WFL for tasks assessed/performed Overall Cognitive Status: Within Functional Limits for tasks assessed  General Comments: Pt is sitting up in chair when PT arrives.  Agreeable to amb in halls.  Wants to practice stairs today to go home.  Requests to use bathroom prior to going out to hall.        Exercises      General Comments        Pertinent Vitals/Pain Pain Assessment Pain Assessment: 0-10 Pain Score: 4  Pain Location: Low back Pain Descriptors / Indicators: Aching, Discomfort, Throbbing Pain Intervention(s): Limited activity within patient's tolerance, Patient requesting pain meds-RN notified    Home Living                          Prior Function            PT Goals (current  goals can now be found in the care plan section) Progress towards PT goals: Progressing toward goals    Frequency    Min 5X/week      PT Plan Current plan remains appropriate    Co-evaluation              AM-PAC PT "6 Clicks" Mobility   Outcome Measure  Help needed turning from your back to your side while in a flat bed without using bedrails?: A Little Help needed moving from lying on your back to sitting on the side of a flat bed without using bedrails?: A Little Help needed moving to and from a bed to a chair (including a wheelchair)?: None Help needed standing up from a chair using your arms (e.g., wheelchair or bedside chair)?: None Help needed to walk in hospital room?: None Help needed climbing 3-5 steps with a railing? : A Little 6 Click Score: 21    End of Session Equipment Utilized During Treatment: Gait belt Activity Tolerance: Patient limited by pain Patient left: in chair;with call bell/phone within reach         Time: 1014-1028 PT Time Calculation (min) (ACUTE ONLY): 14 min  Charges:  $Gait Training: 8-22 mins                    Aritza Brunet A. Eretria Manternach, PT, DPT Acute Rehabilitation Services Office: [place] 07/02/2021, 10:37 AM

## 2021-07-02 NOTE — Discharge Instructions (Signed)
Wound Care Keep incision covered and dry for as long as the surgical wound is draining with a gauze dressing. Change dressing as frequently as needed to keep incision dry. When no longer draining, you can leave the surgical wound open to air. Do not put any creams, lotions, or ointments on incision. You are fine to shower. Let water run over incision and pat dry.  Activity Walk each and every day, increasing distance each day. No lifting greater than 5 lbs.  Avoid excessive back motion. No driving for 2 weeks; may ride as a passenger locally.  Diet Resume your normal diet.   Return to Work Will be discussed at your follow up appointment.  Call Your Doctor If Any of These Occur Redness, drainage, or swelling at the wound.  Temperature greater than 101 degrees. Severe pain not relieved by pain medication. Incision starts to come apart.  Follow Up Appt Call [phone] today for appointment in 2-3 weeks if you don't already have one or for any problems.

## 2021-07-02 NOTE — Discharge Summary (Signed)
Physician Discharge Summary     Providing Compassionate, Quality Care - Together   Patient ID: [name] MRN: [id number] DOB/AGE: September 02, 1965 56 y.o.  Admit date: 06/29/2021 Discharge date: 07/02/2021  Admission Diagnoses: Lumbar stenosis with neurogenic claudication  Discharge Diagnoses:  Principal Problem:   Lumbar stenosis with neurogenic claudication   Discharged Condition: good  Hospital Course: Patient underwent an L1-2 PLIF by Dr. Annette Stable on 06/29/2021. She was admitted following recovery from anesthesia in the PACU. Her postoperative course has been complicated by pain control. She has worked with both physical and occupational therapies who feel the patient is ready for discharge home with home health. She is ambulating with the aid of a walker. She is tolerating a normal diet. She is not having any bowel or bladder dysfunction. Her pain is well-controlled with oral pain medication. She is ready for discharge home with home health.   Consults: PT/OT  Significant Diagnostic Studies: radiology: DG Lumbar Spine 2-3 Views  Result Date: 06/29/2021 CLINICAL DATA:  Anterolateral interbody fusion L1-2 EXAM: LUMBAR SPINE - 2-3 VIEW COMPARISON:  Lumbar spine CT 05/25/2021 FINDINGS: Two intraoperative views of the lumbar spine demonstrate the posterolateral rod and pedicle screw apparatus noted to extend from the L2 level caudad. There is a new intervertebral spacer at the L1-2 level along with bilateral pedicle screws at L1. No complicating feature observed. IMPRESSION: 1. Interval pedicle screw placement at L1 with interbody spacer at L1-2. Electronically Signed   By: Van Clines M.D.   On: 06/29/2021 11:50   DG C-Arm 1-60 Min-No Report  Result Date: 06/29/2021 Fluoroscopy was utilized by the requesting physician.  No radiographic interpretation.   DG C-Arm 1-60 Min-No Report  Result Date: 06/29/2021 Fluoroscopy was utilized by the requesting physician.  No radiographic  interpretation.   DG C-Arm 1-60 Min-No Report  Result Date: 06/29/2021 Fluoroscopy was utilized by the requesting physician.  No radiographic interpretation.     Treatments: surgery: Left L1-L2 retroperitoneal anterior lateral interbody decompression and fusion utilizing interbody cage and morselized allograft  L1-L2 posterior lumbar fusion utilizing nonsegmental pedicle screw fixation and local autograft  Discharge Exam: Blood pressure 124/77, pulse 90, temperature 97.9 F (36.6 C), temperature source Oral, resp. rate 18, height '5\' 9"'  (1.753 m), weight 129.4 kg, last menstrual period 04/10/2006, SpO2 97 %.  Alert and oriented x 4 PERRLA CN II-XII grossly intact MAE, Strength and sensation intact Incision is covered with gauze dressing; Dressing is clean, dry, and intact   Disposition: Discharge disposition: 06-Home-Health Care Svc       Discharge Instructions     Face-to-face encounter (required for Medicare/Medicaid patients)   Complete by: As directed    I Patricia Nettle certify that this patient is under my care and that I, or a nurse practitioner or physician's assistant working with me, had a face-to-face encounter that meets the physician face-to-face encounter requirements with this patient on 07/02/2021. The encounter with the patient was in whole, or in part for the following medical condition(s) which is the primary reason for home health care (List medical condition): Lumbar stenosis with neurogenic claudication   The encounter with the patient was in whole, or in part, for the following medical condition, which is the primary reason for home health care: Lumbar stenosis with neurogenic claudication   I certify that, based on my findings, the following services are medically necessary home health services:  Nursing Physical therapy     Reason for Medically Necessary Home Health Services: Therapy-  Therapeutic Exercises to Increase Strength and Endurance   My clinical  findings support the need for the above services: Unable to leave home safely without assistance and/or assistive device   Further, I certify that my clinical findings support that this patient is homebound due to: Unable to leave home safely without assistance   Home Health   Complete by: As directed    To provide the following care/treatments:  OT PT [place]        Allergies as of 07/02/2021       Reactions   Aspirin Other (See Comments)   Kidney disease   Methadone Hcl Other (See Comments)    "blacked out" in 1990's   Levofloxacin Itching        Medication List     TAKE these medications    acetaminophen 500 MG tablet Commonly known as: TYLENOL Take 1,000 mg by mouth 2 (two) times daily.   albuterol 108 (90 Base) MCG/ACT inhaler Commonly known as: VENTOLIN HFA TAKE 2 PUFFS BY MOUTH EVERY 6 HOURS AS NEEDED FOR WHEEZE OR SHORTNESS OF BREATH What changed: See the new instructions.   ammonium lactate 12 % lotion Commonly known as: LAC-HYDRIN APPLY TWICE A DAY AS NEEDED FOR DRY SKIN What changed: See the new instructions.   atorvastatin 40 MG tablet Commonly known as: LIPITOR Take 1 tablet (40 mg total) by mouth daily.   b complex vitamins capsule Take 1 capsule by mouth daily.   baclofen 10 MG tablet Commonly known as: LIORESAL Take 1 tablet (10 mg total) by mouth 3 (three) times daily as needed for muscle spasms. What changed: when to take this   Bariatric Multivitamins/Iron Caps Take 1 capsule by mouth daily.   blood glucose meter kit and supplies Kit Dispense based on patient and insurance preference. Use up to four times daily as directed. (FOR ICD-9 250.00, 250.01).   busPIRone 10 MG tablet Commonly known as: BUSPAR TAKE 1 TABLET BY MOUTH THREE TIMES A DAY   calcium carbonate 1500 (600 Ca) MG Tabs tablet Commonly known as: OSCAL Take 1,500 mg by mouth 2 (two) times daily.   diclofenac Sodium 1 % Gel Commonly known as: VOLTAREN APPLY  TO AFFECTED AREA AS DIRECTED What changed:  how much to take when to take this additional instructions   diltiazem 120 MG 24 hr capsule Commonly known as: CARDIZEM CD TAKE 1 CAPSULE (120 MG TOTAL) BY MOUTH 2 (TWO) TIMES DAILY.   docusate sodium 100 MG capsule Commonly known as: COLACE Take 200 mg by mouth 2 (two) times daily.   fluticasone 50 MCG/ACT nasal spray Commonly known as: FLONASE PLACE 2 SPRAYS INTO BOTH NOSTRILS TWICE DAILY What changed: See the new instructions.   gabapentin 300 MG capsule Commonly known as: NEURONTIN Take 2 capsules (600 mg total) by mouth 3 (three) times daily. TAKE 2 CAPSULES (600 MG TOTAL) BY MOUTH in the morning and at lunch and take 3 capsules at night What changed:  how much to take additional instructions   hydrOXYzine 50 MG capsule Commonly known as: Vistaril Take 1 capsule (50 mg total) by mouth 3 (three) times daily as needed. What changed: when to take this   ketoconazole 2 % cream Commonly known as: NIZORAL Apply 1 application. topically daily. What changed:  when to take this additional instructions   linaclotide 72 MCG capsule Commonly known as: LINZESS Take 72 mcg by mouth daily before breakfast.   loratadine 10 MG tablet Commonly known as:  CLARITIN Take 1 tablet (10 mg total) by mouth daily.   methocarbamol 750 MG tablet Commonly known as: ROBAXIN Take 1 tablet (750 mg total) by mouth every 6 (six) hours as needed for muscle spasms. What changed:  when to take this additional instructions   metolazone 2.5 MG tablet Commonly known as: ZAROXOLYN TAKE 1 TABLET EVERY OTHER DAY. TAKE 1 TABLET TWICE WEEKLY ON MONDAYS AND THURSDAYS ONLY What changed: See the new instructions.   montelukast 10 MG tablet Commonly known as: SINGULAIR TAKE 1 TABLET BY MOUTH EVERYDAY AT BEDTIME What changed: See the new instructions.   Narcan 4 MG/0.1ML Liqd nasal spray kit Generic drug: naloxone Place 0.4 mg into the nose once as  needed (accidental overdose).   nystatin 100000 UNIT/ML suspension Commonly known as: MYCOSTATIN TAKE 5 MLS (500,000 UNITS TOTAL) BY MOUTH 4 (FOUR) TIMES DAILY. SWISH AND SWALLOW. What changed:  when to take this additional instructions   omeprazole 20 MG capsule Commonly known as: PRILOSEC TAKE 1 CAPSULE BY MOUTH EVERY DAY What changed:  how much to take how to take this when to take this   oxyCODONE-acetaminophen 10-325 MG tablet Commonly known as: PERCOCET Take 1 tablet by mouth every 4 (four) hours as needed for pain. Scheduled per pt - 3am, 9am, 3pm, 9pm (take with robaxin) What changed:  when to take this reasons to take this   Potassium Chloride ER 20 MEQ Tbcr Take 20 mEq by mouth every morning.   Trelegy Ellipta 100-62.5-25 MCG/ACT Aepb Generic drug: Fluticasone-Umeclidin-Vilant Inhale 1 puff into the lungs daily.   venlafaxine XR 75 MG 24 hr capsule Commonly known as: EFFEXOR-XR TAKE 1 CAPSULE BY MOUTH DAILY WITH BREAKFAST. What changed: how to take this               Durable Medical Equipment  (From admission, onward)           Start     Ordered   06/29/21 1407  DME Walker rolling  Once       Question:  Patient needs a walker to treat with the following condition  Answer:  Lumbar stenosis with neurogenic claudication   06/29/21 1406   06/29/21 1407  DME 3 n 1  Once        06/29/21 1406            Follow-up Information     Earnie Larsson, MD. Go on 07/10/2021.   Specialty: Neurosurgery Why: First post op appointment is on 07/10/2021 at 4:30 pm. Contact information: [address] [place] [address] [phone]                 Signed: Viona Gilmore, DNP, AGNP-C Nurse Practitioner  [facility] [place] [address], [address], [place], [place] [address] P: [phone]    F: [phone]  07/02/2021, 11:19 AM

## 2021-07-02 NOTE — TOC Initial Note (Addendum)
Transition of Care [facility]) - Initial/Assessment Note    Patient Details  Name: [name] MRN: [id number] Date of Birth: 05-Oct-1965  Transition of [facility]) CM/SW Contact:    Marilu Favre, RN Phone Number: 07/02/2021, 8:16 AM  Clinical Narrative:                 Spoke to patient at bedside. Patient from home alone, but states she has great support. She will have people coming in to assist her at discharge.   PT recommending HHPT and no DME, OT recommending no DME and no OT follow up.   Will need MD order and face to face   Patient requesting 3 in 1 . MD placed order. NCM called and left voicemail for  Freda Munro with [place]. Explained to patient unsure if insurance will cover. [employer] will discuss with her.   Patient has no preference for HHPT agency. Left message with Amy with [facility].  Patient reports PCP is working on [employer] [facility]   [address] again, Amy will check to see if she can accept referral   11[address] Amy with [facility] accepted referral . Requested nurse to teach any needed wound care prior to discharge   Expected Discharge Plan: [place]     Patient Goals and CMS Choice Patient states their goals for this hospitalization and ongoing recovery are:: to return to home CMS Medicare.gov Compare Post Acute Care list provided to:: Patient Choice offered to / list presented to : Patient  Expected Discharge Plan and Services Expected Discharge Plan: [place]   Discharge Planning Services: CM Consult Post Acute Care Choice: [place] arrangements for the past 2 months: Single Family Home                 DME Arranged: 3-N-1 DME Agency: [facility] Date DME Agency Contacted: 07/02/21 Time DME Agency Contacted: [phone] Representative spoke with at DME Agency: left message on [place]: PT [place] Agency: [place] Date [place]: 07/02/21 Time [place]:  [phone] Representative spoke with at [place]: [facility] left voicemail for Amy await call back  Prior Living Arrangements/Services Living arrangements for the past 2 months: Single Family Home Lives with:: Self Patient language and need for interpreter reviewed:: Yes Do you feel safe going back to the place where you live?: Yes      Need for Family Participation in Patient Care: Yes (Comment) Care giver support system in place?: Yes (comment) Current home services: DME Criminal Activity/Legal Involvement Pertinent to Current Situation/Hospitalization: No - Comment as needed  Activities of Daily Living Home Assistive Devices/Equipment: Eyeglasses, Dentures (specify type), Shower chair with back, Raised toilet seat with rails, Blood pressure cuff, CPAP, CBG Meter, Cane (specify quad or straight), Walker (specify type), Grab bars around toilet, Grab bars in shower ADL Screening (condition at time of admission) Patient's cognitive ability adequate to safely complete daily activities?: Yes Is the patient deaf or have difficulty hearing?: No Does the patient have difficulty seeing, even when wearing glasses/contacts?: No Does the patient have difficulty concentrating, remembering, or making decisions?: No Patient able to express need for assistance with ADLs?: Yes Does the patient have difficulty dressing or bathing?: No Independently performs ADLs?: Yes (appropriate for developmental age) Does the patient have difficulty walking or climbing stairs?: Yes Weakness of Legs: Both Weakness of Arms/Hands: Both  Permission Sought/Granted   Permission granted to share information with : No  Emotional Assessment Appearance:: Appears stated age Attitude/Demeanor/Rapport: Engaged Affect (typically observed): Accepting Orientation: : Oriented to Self, Oriented to Place, Oriented to  Time, Oriented to Situation Alcohol / Substance Use: Not Applicable Psych Involvement: No  (comment)  Admission diagnosis:  Lumbar stenosis with neurogenic claudication [M48.062] Patient Active Problem List   Diagnosis Date Noted   Lumbar stenosis with neurogenic claudication 06/29/2021   Lumbar radiculopathy 06/29/2021   Biceps tendinopathy of right upper extremity 05/03/2021   Irregular heartbeat 04/11/2021   Neuropathic pain of foot 03/28/2021   Chronic congestion of paranasal sinus 03/28/2021   Impingement syndrome of right shoulder 03/08/2021   Arthritis of right acromioclavicular joint 03/08/2021   Acute medial meniscus tear of right knee 03/08/2021   Acute lateral meniscus tear of right knee 03/08/2021   Spinal stenosis of thoracic region with radiculopathy    Bilateral leg numbness    Spinal stenosis of lumbar region with neurogenic claudication    Unilateral primary osteoarthritis, right knee    Impaired ambulation 01/10/2021   Status post lumbar laminectomy 10/03/2020   Cubital tunnel syndrome on left 05/02/2020   Vaginal discharge 01/20/2020   Prolapse of anterior vaginal wall 11/10/2019   Skin ulcer of left thigh, limited to breakdown of skin ([place]) 09/09/2019   Ulnar nerve compression 08/06/2019   Diabetic neuropathy associated with type 2 diabetes mellitus ([place]) 08/06/2019   Body mass index 50.0-59.9, adult ([place]) 06/12/2019   Impingement syndrome of left shoulder 05/07/2019   Polyp of colon    Polyp of ascending colon    Rectal discomfort 02/12/2019   Osteoarthritis of right hip 02/12/2019   Nontraumatic tear of left supraspinatus tendon 12/30/2018   Gout 12/17/2018   Nontraumatic complete tear of right rotator cuff 12/08/2018   Rotator cuff tear, right 12/01/2018   Tobacco abuse 08/21/2018   Right groin pain 07/20/2018   Depressed mood 07/13/2018   Paroxysmal SVT (supraventricular tachycardia) ([place]) 03/20/2018   Pure hypercholesterolemia 03/20/2018   Muscle cramping 02/25/2018   Nodule of upper lobe of right lung 02/13/2018   Falls, subsequent  encounter 01/30/2018   Pain in left foot 11/04/2017   Essential hypertension 10/27/2017   Solitary pulmonary nodule 10/07/2017   Restrictive lung disease secondary to obesity 10/01/2017   Polycythemia, secondary 10/01/2017   Chronic viral hepatitis B without delta-agent ([place]) 07/08/2017   COPD (chronic obstructive pulmonary disease) with chronic bronchitis ([place]) 06/27/2017   Chronic kidney disease (CKD), stage IV (severe) ([place]) 01/14/2017   Decubitus ulcer 12/13/2016   Type 2 diabetes mellitus with stage 3 chronic kidney disease, with long-term current use of insulin ([place]) 12/12/2016   Urge incontinence of urine 12/05/2016   Trigger finger, left index finger 07/15/2016   Back pain 04/07/2014   Morbid obesity ([place]) 10/02/2012   Chronic diastolic CHF (congestive heart failure) ([place]) 04/07/2012   Skin lesion of scalp 03/15/2011   GERD 01/26/2010   Obstructive sleep apnea treated with BiPAP 02/03/2008   FIBROCYSTIC BREAST DISEASE 10/28/2006   Hyperlipidemia 03/27/2006   Obesity hypoventilation syndrome ([place]) 03/27/2006   Depression with anxiety 03/27/2006   PCP:  [name], MD Pharmacy:   [place], [place] [address] [place] [address] Phone: [phone] Fax: [phone]     Social Determinants of Health (SDOH) Interventions    Readmission Risk Interventions     View : No data to display.

## 2021-07-02 NOTE — Progress Notes (Signed)
PT Cancellation Note  Patient Details Name: [name] MRN: [id number] DOB: 1966/01/08   Cancelled Treatment:    Reason Eval/Treat Not Completed: Patient declined, no reason specified (PT returns 42 minutes after initial treatment.  Pt states NSG "just left" and she needs 30 min even though NSG has been in room x 30 min and told PT she gave pt meds when she first got in room.  Pt refuses to work with PT at this time.)  Sarim Rothman A. Eliberto Sole, PT, DPT Acute Rehabilitation Services Office: [place] 07/02/2021, 11:17 AM

## 2021-07-02 NOTE — Plan of Care (Signed)
  Problem: Fluid Volume: Goal: Ability to maintain a balanced intake and output will improve Outcome: Progressing   Problem: Nutritional: Goal: Maintenance of adequate nutrition will improve Outcome: Progressing   Problem: Bowel/Gastric: Goal: Gastrointestinal status for postoperative course will improve Outcome: Progressing

## 2021-07-03 ENCOUNTER — Encounter: Payer: Self-pay | Admitting: Family Medicine

## 2021-07-03 ENCOUNTER — Encounter: Payer: Self-pay | Admitting: *Deleted

## 2021-07-03 DIAGNOSIS — I471 Supraventricular tachycardia: Secondary | ICD-10-CM

## 2021-07-04 ENCOUNTER — Encounter: Payer: Self-pay | Admitting: Family Medicine

## 2021-07-05 ENCOUNTER — Ambulatory Visit (HOSPITAL_BASED_OUTPATIENT_CLINIC_OR_DEPARTMENT_OTHER): Payer: 59 | Admitting: Cardiology

## 2021-07-06 ENCOUNTER — Telehealth: Payer: Self-pay | Admitting: Pharmacist

## 2021-07-06 MED ORDER — DILTIAZEM HCL ER COATED BEADS 120 MG PO CP24: 120.0000 mg | ORAL_CAPSULE | Freq: Two times a day (BID) | ORAL | 2 refills | Status: DC

## 2021-07-06 NOTE — Telephone Encounter (Signed)
Patient contacted for follow/up of tobacco intake reduction / tobacco cessation attempt.   Patient requested nicotine patches. (Has used in the past).  Request lowest cost.    She rates CONFIDENCE of quitting tobacco  as very high following her back surgery 1 week ago.     Provided information on 1 800-QUIT NOW support program to obtain "FREE patches".   Patient willing and comfortable doing this.  She reports doing that in the past.   Total time with patient call and documentation of interaction: 9 minutes.  F/U Phone call planned: 1 week.

## 2021-07-09 NOTE — Telephone Encounter (Signed)
Noted and agree. 

## 2021-07-11 ENCOUNTER — Other Ambulatory Visit: Payer: Self-pay | Admitting: Family Medicine

## 2021-07-11 DIAGNOSIS — I471 Supraventricular tachycardia, unspecified: Secondary | ICD-10-CM

## 2021-07-13 ENCOUNTER — Telehealth: Payer: Self-pay | Admitting: Pharmacist

## 2021-07-13 NOTE — Telephone Encounter (Signed)
-----   Message from Leavy Cella, [place] sent at 07/06/2021  4:21 PM EDT ----- Regarding: Tobacco Cessation quit with patches?

## 2021-07-13 NOTE — Telephone Encounter (Signed)
Noted and agreed thank you

## 2021-07-13 NOTE — Telephone Encounter (Signed)
Patient contacted for follow/up of tobacco intake reduction / tobacco cessation attempt.  Back pain post surgery is present but patient feels it is "healing pain".  Overall, she states she is doing better and is focused on healing and quitting smoking soon.   Since last contact patient reports she has contacted the [place] support line.  She reports that they are sending her a supply of nicotine patches.  She anticipates they will arrive in 5 days.   Her plan is to start patches upon arrival and quit at the same time.   Verbalizes the IMPORTANCE of quitting tobacco as high.  Motivation to quit: Breathing  Encouraged to continue to use 1 800-QUIT NOW support program.   Total time with patient call and documentation of interaction: 11 minutes.  F/U Phone call planned: 7-10 days

## 2021-07-16 ENCOUNTER — Other Ambulatory Visit: Payer: Self-pay | Admitting: *Deleted

## 2021-07-16 ENCOUNTER — Telehealth: Payer: Self-pay | Admitting: Specialist

## 2021-07-16 MED ORDER — TRELEGY ELLIPTA 100-62.5-25 MCG/ACT IN AEPB: 1.0000 | INHALATION_SPRAY | Freq: Every day | RESPIRATORY_TRACT | 3 refills | Status: DC

## 2021-07-16 NOTE — Telephone Encounter (Signed)
I called and advised patient to call her PCP and make an appointment to see them and discuss this, she states that she is scheduled to see her PCP on Wednesday 07/18/21.

## 2021-07-16 NOTE — Telephone Encounter (Signed)
Patient called asked if Dr Louanne Skye will see her and explain why she is so swollen in her right leg and left leg as well. Patient said Dr. Trenton Gammon did surgery on her 06/29/2021 and won't explain when will the swelling go down. Patient said Dr. Trenton Gammon took the stitches out on the 13th.  The number to contact patient is [phone]

## 2021-07-17 ENCOUNTER — Other Ambulatory Visit: Payer: Self-pay | Admitting: *Deleted

## 2021-07-17 ENCOUNTER — Telehealth: Payer: Self-pay | Admitting: Orthopaedic Surgery

## 2021-07-17 MED ORDER — MONTELUKAST SODIUM 10 MG PO TABS: ORAL_TABLET | ORAL | 3 refills | Status: DC

## 2021-07-17 NOTE — Telephone Encounter (Signed)
Called patient left message to return call to schedule an appointment per [facility] request with Dr Erlinda Hong

## 2021-07-17 NOTE — Addendum Note (Signed)
Addended by: Concepcion Living on: 07/17/2021 02:31 PM   Modules accepted: Orders

## 2021-07-18 ENCOUNTER — Ambulatory Visit: Payer: 59 | Admitting: Family Medicine

## 2021-07-19 ENCOUNTER — Ambulatory Visit: Payer: 59 | Admitting: Family Medicine

## 2021-07-19 ENCOUNTER — Ambulatory Visit: Payer: 59 | Admitting: Orthopaedic Surgery

## 2021-07-25 ENCOUNTER — Ambulatory Visit: Payer: 59 | Admitting: Family Medicine

## 2021-07-26 ENCOUNTER — Other Ambulatory Visit: Payer: Self-pay | Admitting: Family Medicine

## 2021-07-26 DIAGNOSIS — N183 Chronic kidney disease, stage 3 unspecified: Secondary | ICD-10-CM

## 2021-08-01 ENCOUNTER — Telehealth: Payer: Self-pay | Admitting: Pharmacist

## 2021-08-01 NOTE — Telephone Encounter (Signed)
-----   Message from Leavy Cella, [place] sent at 07/13/2021 11:46 AM EDT ----- Regarding: Quit with use of Quit line patches?

## 2021-08-01 NOTE — Telephone Encounter (Signed)
Patient contacted for follow/up of tobacco intake reduction.    Since last contact patient reports continued pain with her back.  Pain is currently 9/10.  Medications currently being used; NONE  Continues to rates IMPORTANCE of quitting tobacco as high.  Continues to rate CONFIDENCE as low as her current level of interest / commitment is low due to pain.   Most common triggers to use tobacco include; pain, habit.   Total time with patient call and documentation of interaction: 11 minutes. Follow-up phone call planned: None - patient will contact me when interested again.

## 2021-08-02 ENCOUNTER — Ambulatory Visit: Payer: 59 | Admitting: Orthopaedic Surgery

## 2021-08-02 ENCOUNTER — Ambulatory Visit: Payer: 59 | Admitting: Skilled Nursing Facility1

## 2021-08-05 ENCOUNTER — Encounter: Payer: Self-pay | Admitting: Plastic Surgery

## 2021-08-07 ENCOUNTER — Ambulatory Visit: Payer: 59 | Admitting: Orthopaedic Surgery

## 2021-08-13 ENCOUNTER — Other Ambulatory Visit: Payer: Self-pay | Admitting: Family Medicine

## 2021-08-13 ENCOUNTER — Ambulatory Visit (HOSPITAL_BASED_OUTPATIENT_CLINIC_OR_DEPARTMENT_OTHER): Payer: 59 | Admitting: Cardiology

## 2021-08-13 ENCOUNTER — Emergency Department (HOSPITAL_COMMUNITY)
Admission: EM | Admit: 2021-08-13 | Discharge: 2021-08-14 | Disposition: A | Discharge status: Home / Self Care | Payer: 59 | Attending: Emergency Medicine | Admitting: Emergency Medicine

## 2021-08-13 ENCOUNTER — Emergency Department (HOSPITAL_COMMUNITY): Payer: 59

## 2021-08-13 ENCOUNTER — Encounter (HOSPITAL_COMMUNITY): Payer: Self-pay

## 2021-08-13 DIAGNOSIS — F1990 Other psychoactive substance use, unspecified, uncomplicated: Secondary | ICD-10-CM

## 2021-08-13 DIAGNOSIS — I509 Heart failure, unspecified: Secondary | ICD-10-CM | POA: Insufficient documentation

## 2021-08-13 DIAGNOSIS — E876 Hypokalemia: Secondary | ICD-10-CM | POA: Diagnosis not present

## 2021-08-13 DIAGNOSIS — E119 Type 2 diabetes mellitus without complications: Secondary | ICD-10-CM | POA: Diagnosis not present

## 2021-08-13 DIAGNOSIS — Z85828 Personal history of other malignant neoplasm of skin: Secondary | ICD-10-CM | POA: Diagnosis not present

## 2021-08-13 DIAGNOSIS — Z79899 Other long term (current) drug therapy: Secondary | ICD-10-CM | POA: Diagnosis not present

## 2021-08-13 DIAGNOSIS — N183 Chronic kidney disease, stage 3 unspecified: Secondary | ICD-10-CM | POA: Insufficient documentation

## 2021-08-13 DIAGNOSIS — J449 Chronic obstructive pulmonary disease, unspecified: Secondary | ICD-10-CM | POA: Diagnosis not present

## 2021-08-13 DIAGNOSIS — F1491 Cocaine use, unspecified, in remission: Secondary | ICD-10-CM | POA: Diagnosis not present

## 2021-08-13 DIAGNOSIS — I130 Hypertensive heart and chronic kidney disease with heart failure and stage 1 through stage 4 chronic kidney disease, or unspecified chronic kidney disease: Secondary | ICD-10-CM | POA: Diagnosis not present

## 2021-08-13 DIAGNOSIS — R4182 Altered mental status, unspecified: Secondary | ICD-10-CM | POA: Diagnosis present

## 2021-08-13 LAB — COMPREHENSIVE METABOLIC PANEL
ALT: 31 U/L (ref 0–44)
AST: 33 U/L (ref 15–41)
Albumin: 3.7 g/dL (ref 3.5–5.0)
Alkaline Phosphatase: 87 U/L (ref 38–126)
Anion gap: 16 — ABNORMAL HIGH (ref 5–15)
BUN: 20 mg/dL (ref 6–20)
CO2: 23 mmol/L (ref 22–32)
Calcium: 9.7 mg/dL (ref 8.9–10.3)
Chloride: 104 mmol/L (ref 98–111)
Creatinine, Ser: 0.91 mg/dL (ref 0.44–1.00)
GFR, Estimated: >60 mL/min (ref >60)
Glucose, Bld: 144 mg/dL — ABNORMAL HIGH (ref 70–99)
Potassium: 3 mmol/L — ABNORMAL LOW (ref 3.5–5.1)
Sodium: 143 mmol/L (ref 135–145)
Total Bilirubin: 1.5 mg/dL — ABNORMAL HIGH (ref 0.3–1.2)
Total Protein: 7.5 g/dL (ref 6.5–8.1)

## 2021-08-13 LAB — CBC
HCT: 49.3 % — ABNORMAL HIGH (ref 36.0–46.0)
Hemoglobin: 17.3 g/dL — ABNORMAL HIGH (ref 12.0–15.0)
MCH: 28.8 pg (ref 26.0–34.0)
MCHC: 35.1 g/dL (ref 30.0–36.0)
MCV: 82 fL (ref 80.0–100.0)
Platelets: 394 10*3/uL (ref 150–400)
RBC: 6.01 MIL/uL — ABNORMAL HIGH (ref 3.87–5.11)
RDW: 16.4 % — ABNORMAL HIGH (ref 11.5–15.5)
WBC: 17.6 10*3/uL — ABNORMAL HIGH (ref 4.0–10.5)
nRBC: 0 % (ref 0.0–0.2)

## 2021-08-13 LAB — ETHANOL: Alcohol, Ethyl (B): <10 mg/dL (ref <10)

## 2021-08-13 LAB — CBG MONITORING, ED: Glucose-Capillary: 169 mg/dL — ABNORMAL HIGH (ref 70–99)

## 2021-08-13 LAB — CK: Total CK: 186 U/L (ref 38–234)

## 2021-08-13 MED ORDER — SODIUM CHLORIDE 0.9 % IV BOLUS
1000.0000 mL | Freq: Once | INTRAVENOUS | Status: AC
  Administered 2021-08-13: 1000 mL via INTRAVENOUS

## 2021-08-13 NOTE — ED Provider Notes (Signed)
[facility] DEPT Provider Note   CSN: [id number] Arrival date & time: 08/13/21  1843     History  Chief Complaint  Patient presents with   Altered Mental Status    [name] is a 56 y.o. female.   Altered Mental Status Presenting symptoms: confusion   Patient brought in for mental status change.  Reportedly has been on the floor since yesterday.  History of cocaine use.  Naked and covered in urine.  Reportedly has been on the floor for least today.  Had not really been eating.  Patient's son found her.  Patient states she feels little bad.    Past Medical History:  Diagnosis Date   Alcoholism ([place])    Anxiety    Arthritis 04/10/2011   hips, shoulders, back   Asthma    Bipolar disorder ([place])    Cardiomegaly    Carpal tunnel syndrome, bilateral    Cervical cancer ([place]) 1993   cervical, no treatment done, went away per pt   Cervical dysplasia or atypia 04/10/2011   '93- once dx.-got pregnant-no intervention, then postpartum, no dysplasia found   CHF (congestive heart failure) ([place])    no cardiologist 2014 dx, none now   Chronic hypoxemic respiratory failure ([place])    Chronic kidney disease    stage 3 kidney disease   Condyloma - gluteal cleft 04/09/2011   Removed by general surgery. Pathology showed Condyloma, gluteal CONDYLOMA ACUMINATUM.    COPD (chronic obstructive pulmonary disease) ([place])    Depression    Diabetes mellitus without complication ([place])    no meds today   Dyspnea    with actity, sitting   Fall 12/16/2016   Fibrocystic breast disease    GERD (gastroesophageal reflux disease)    Grade I diastolic dysfunction 28/78/6767   Noted on ECHO   Hepatitis    hep B-count is low at present,doesn't register   History of PSVT (paroxysmal supraventricular tachycardia)    Hypercalcemia    Hyperlipidemia    Hypertension    Incomplete left bundle branch block (LBBB) 09/24/2018   Noted on EKG   Lung nodule 11/2017   right  and left lung   Migraine headache    none recent   Morbid obesity ([name]) 03/27/2006   Neuropathy    Pneumonia    walking pneu 15 yrs. ago   Skin lesion 03/15/2011   In gluteal crease now s/p removal by Dr. [name] of General Surgery on 3/12. Path shows condyloma.      Sleep apnea 04/10/2011   does not use after loss   Trigger finger    left third   Urge incontinence of urine     Home Medications Prior to Admission medications   Medication Sig Start Date End Date Taking? Authorizing Provider  ACCU-CHEK AVIVA PLUS test strip 1 EACH BY OTHER ROUTE 3 (THREE) TIMES DAILY. TEST 3 TIMES DAILY 07/26/21   Concepcion Living, MD  acetaminophen (TYLENOL) 500 MG tablet Take 1,000 mg by mouth 2 (two) times daily.    [provider]  albuterol (VENTOLIN HFA) 108 (90 Base) MCG/ACT inhaler TAKE 2 PUFFS BY MOUTH EVERY 6 HOURS AS NEEDED FOR WHEEZE OR SHORTNESS OF BREATH Patient taking differently: 2 puffs every 6 (six) hours as needed for wheezing or shortness of breath. 03/19/21   Cresenzo, Angelyn Punt, MD  ammonium lactate (LAC-HYDRIN) 12 % lotion APPLY TWICE A DAY AS NEEDED FOR DRY SKIN Patient taking differently: 1 application. 2 (two) times daily.  For dry skin 04/09/21   Gardiner Barefoot, DPM  atorvastatin (LIPITOR) 40 MG tablet Take 1 tablet (40 mg total) by mouth daily. 01/15/21   Concepcion Living, MD  b complex vitamins capsule Take 1 capsule by mouth daily. Patient not taking: Reported on 06/30/2021    [provider]  baclofen (LIORESAL) 10 MG tablet Take 1 tablet (10 mg total) by mouth 3 (three) times daily as needed for muscle spasms. Patient taking differently: Take 10 mg by mouth 3 (three) times daily. 03/24/18   [name], MD  blood glucose meter kit and supplies KIT Dispense based on patient and insurance preference. Use up to four times daily as directed. (FOR ICD-9 250.00, 250.01). 12/20/16   Bonnita Hollow, MD  busPIRone (BUSPAR) 10 MG tablet TAKE 1 TABLET BY MOUTH THREE TIMES A  DAY Patient taking differently: Take 10 mg by mouth 3 (three) times daily. 10/30/20   Concepcion Living, MD  calcium carbonate (OSCAL) 1500 (600 Ca) MG TABS tablet Take 1,500 mg by mouth 2 (two) times daily.    [provider]  diclofenac Sodium (VOLTAREN) 1 % GEL APPLY TO AFFECTED AREA AS DIRECTED Patient taking differently: 1 application. 2 (two) times daily. 06/28/21   Cresenzo, Angelyn Punt, MD  diltiazem (CARDIZEM CD) 120 MG 24 hr capsule TAKE 1 CAPSULE BY MOUTH 2 TIMES DAILY. 07/12/21   Lattie Haw, MD  docusate sodium (COLACE) 100 MG capsule Take 200 mg by mouth 2 (two) times daily. Patient not taking: Reported on 06/30/2021    [provider]  fluticasone (FLONASE) 50 MCG/ACT nasal spray PLACE 2 SPRAYS INTO BOTH NOSTRILS TWICE DAILY Patient taking differently: Place 2 sprays into both nostrils 2 (two) times daily. 08/21/20   Concepcion Living, MD  Fluticasone-Umeclidin-Vilant (TRELEGY ELLIPTA) 100-62.5-25 MCG/ACT AEPB Inhale 1 puff into the lungs daily. 07/16/21   Cresenzo, Angelyn Punt, MD  gabapentin (NEURONTIN) 300 MG capsule Take 2 capsules (600 mg total) by mouth 3 (three) times daily. TAKE 2 CAPSULES (600 MG TOTAL) BY MOUTH in the morning and at lunch and take 3 capsules at night Patient taking differently: Take 300-600 mg by mouth 3 (three) times daily. Take 2 capsules (600 mg) every morning and at 1pm; take 3 capsules (900 mg) at bedtime 06/13/21   Cresenzo, Angelyn Punt, MD  hydrOXYzine (ATARAX) 10 MG tablet Take 10 mg by mouth at bedtime. 08/13/21   [provider]  hydrOXYzine (VISTARIL) 50 MG capsule Take 1 capsule (50 mg total) by mouth 3 (three) times daily as needed. Patient taking differently: Take 50 mg by mouth every morning. 05/24/21   Jessy Oto, MD  ketoconazole (NIZORAL) 2 % cream Apply 1 application. topically daily. Patient taking differently: Apply 1 application. topically 3 (three) times daily. Apply to abdominal folds 06/12/21   Gardiner Barefoot, DPM  linaclotide  [facility]) 72 MCG capsule Take 72 mcg by mouth daily before breakfast.    [provider]  loratadine (CLARITIN) 10 MG tablet Take 1 tablet (10 mg total) by mouth daily. 06/15/20   Cresenzo, Angelyn Punt, MD  methocarbamol (ROBAXIN) 750 MG tablet Take 1 tablet (750 mg total) by mouth every 6 (six) hours as needed for muscle spasms. Patient taking differently: Take 750 mg by mouth every 6 (six) hours. Scheduled per pt - 3am, 9am, 3pm, 9pm (take with percocet) 05/24/21   Jessy Oto, MD  metolazone (ZAROXOLYN) 2.5 MG tablet TAKE 1 TABLET EVERY OTHER DAY. TAKE 1 TABLET TWICE WEEKLY  ON MONDAYS AND THURSDAYS ONLY Patient taking differently: Take 2.5 mg by mouth 2 (two) times a week. Mondays and Thursdays 06/12/20   [name], MD  montelukast (SINGULAIR) 10 MG tablet TAKE 1 TABLET BY MOUTH EVERYDAY AT BEDTIME Strength: 10 mg 07/17/21   Concepcion Living, MD  Multiple Vitamins-Minerals (BARIATRIC MULTIVITAMINS/IRON) CAPS Take 1 capsule by mouth daily. Patient not taking: Reported on 06/30/2021    [provider]  NARCAN 4 MG/0.1ML LIQD nasal spray kit Place 0.4 mg into the nose once as needed (accidental overdose). 02/16/18   [provider]  nystatin (MYCOSTATIN) 100000 UNIT/ML suspension TAKE 5 MLS (500,000 UNITS TOTAL) BY MOUTH 4 (FOUR) TIMES DAILY. SWISH AND SWALLOW. Patient taking differently: Take 5 mLs by mouth every morning. Swish and spit 05/18/21   Cresenzo, Angelyn Punt, MD  omeprazole (PRILOSEC) 20 MG capsule TAKE 1 CAPSULE BY MOUTH EVERY DAY Patient taking differently: 20 mg every morning. 01/09/21   Concepcion Living, MD  oxyCODONE-acetaminophen (PERCOCET) 10-325 MG tablet Take 1 tablet by mouth every 4 (four) hours as needed for pain. Scheduled per pt - 3am, 9am, 3pm, 9pm (take with robaxin) 07/02/21   Viona Gilmore D, NP  Potassium Chloride ER 20 MEQ TBCR Take 20 mEq by mouth every morning. 09/09/20   [provider]  potassium chloride SA (KLOR-CON M) 20 MEQ  tablet Take 20 mEq by mouth daily. 08/13/21   [provider]  venlafaxine XR (EFFEXOR-XR) 75 MG 24 hr capsule TAKE 1 CAPSULE BY MOUTH DAILY WITH BREAKFAST. Patient taking differently: 75 mg daily with breakfast. 11/07/20   Concepcion Living, MD      Allergies    Aspirin, Methadone hcl, and Levofloxacin    Review of Systems   Review of Systems  Psychiatric/Behavioral:  Positive for confusion.     Physical Exam Updated Vital Signs BP (!) 186/83   Pulse 100   Temp 97.8 F (36.6 C) (Oral)   Resp 18   LMP 01/29/1992   SpO2 97%  Physical Exam Vitals and nursing note reviewed.  Cardiovascular:     Rate and Rhythm: Regular rhythm.  Abdominal:     Tenderness: There is abdominal tenderness.     Comments: Mild lower abdominal tenderness without rebound or guarding.  No hernia palpated.  Musculoskeletal:     Cervical back: Neck supple.     Right lower leg: Edema present.     Left lower leg: Edema present.     Comments: Mild edema bilateral lower extremities.  Neurological:     Mental Status: She is alert.     Comments: Awake and pleasant.  Mental status reportedly improved from prior but reportedly not back at her baseline     ED Results / Procedures / Treatments   Labs (all labs ordered are listed, but only abnormal results are displayed) Labs Reviewed  COMPREHENSIVE METABOLIC PANEL - Abnormal; Notable for the following components:      Result Value   Potassium 3.0 (*)    Glucose, Bld 144 (*)    Total Bilirubin 1.5 (*)    Anion gap 16 (*)    All other components within normal limits  CBC - Abnormal; Notable for the following components:   WBC 17.6 (*)    RBC 6.01 (*)    Hemoglobin 17.3 (*)    HCT 49.3 (*)    RDW 16.4 (*)    All other components within normal limits  CBG MONITORING, ED - Abnormal; Notable for the following  components:   Glucose-Capillary 169 (*)    All other components within normal limits  ETHANOL  CK  URINALYSIS, ROUTINE W REFLEX  MICROSCOPIC  RAPID URINE DRUG SCREEN, HOSP PERFORMED    EKG None  Radiology DG Chest Portable 1 View  Result Date: 08/13/2021 CLINICAL DATA:  Cough, fall, altered mental status EXAM: PORTABLE CHEST 1 VIEW COMPARISON:  10/26/2019 FINDINGS: Patient is mildly rotated on this semi erect examination. The lungs are symmetrically expanded. Bibasilar opacification likely relates to overlying soft tissue and underpenetration. The lungs are clear. No pneumothorax or pleural effusion. Cardiac size is within normal limits when accounting for semi upright positioning. Pulmonary vascularity is normal. No acute bone abnormality. Cervical fusion hardware partially visualized. IMPRESSION: No evidence of acute cardiopulmonary disease. Electronically Signed   By: Fidela Salisbury M.D.   On: 08/13/2021 20:59   CT Head Wo Contrast  Result Date: 08/13/2021 CLINICAL DATA:  Altered level of consciousness EXAM: CT HEAD WITHOUT CONTRAST TECHNIQUE: Contiguous axial images were obtained from the base of the skull through the vertex without intravenous contrast. RADIATION DOSE REDUCTION: This exam was performed according to the departmental dose-optimization program which includes automated exposure control, adjustment of the mA and/or kV according to patient size and/or use of iterative reconstruction technique. COMPARISON:  12/09/2008 FINDINGS: Brain: Stable hypodensities within the periventricular white matter consistent with chronic small vessel ischemic change. No evidence of acute infarct or hemorrhage. Lateral ventricles and midline structures are unremarkable. No acute extra-axial fluid collections. No mass effect. Vascular: No hyperdense vessel or unexpected calcification. Skull: Normal. Negative for fracture or focal lesion. Sinuses/Orbits: No acute finding. Other: None. IMPRESSION: 1. No acute intracranial process. Electronically Signed   By: Randa Ngo M.D.   On: 08/13/2021 19:57    Procedures Procedures     Medications Ordered in ED Medications  potassium chloride 10 mEq in 100 mL IVPB (has no administration in time range)  sodium chloride 0.9 % bolus 1,000 mL (1,000 mLs Intravenous New Bag/Given 08/13/21 2317)    ED Course/ Medical Decision Making/ A&P                           Medical Decision Making Amount and/or Complexity of Data Reviewed Labs: ordered. Radiology: ordered.   Patient presents for weakness mental status change.  Abdominal pain.  Found confused and on the ground.  Reportedly incontinent of urine.  Lab work so far is reassuring.  Mild hypokalemia with potassium of 3.0.  Will supplement.  White count and hemoglobin are elevated.  Does have a history of CHF.  History of noncompliance.  Has not had her medicine for a couple days.  She is somewhat hypertensive.  Urinalysis still pending.  Potentially require admission to the hospital for confusion.  Head CT reviewed and reassuring.  Chest x-ray did not show pneumonia care will be turned over to oncoming provider.        Final Clinical Impression(s) / ED Diagnoses Final diagnoses:  Altered mental status, unspecified altered mental status type  Substance use  Hypokalemia    Rx / DC Orders ED Discharge Orders     None         Davonna Belling, MD 08/14/21 [phone]

## 2021-08-13 NOTE — ED Provider Notes (Incomplete)
[facility] DEPT Provider Note   CSN: [id number] Arrival date & time: 08/13/21  1843     History {Add pertinent medical, surgical, social history, OB history to HPI:1} Chief Complaint  Patient presents with  . Altered Mental Status    [name] is a 56 y.o. female.   Altered Mental Status Presenting symptoms: confusion   Patient brought in for mental status change.  Reportedly has been on the floor since yesterday.  History of cocaine use.  Naked and covered in urine.  Reportedly has been on the floor for least today.  Had not really been eating.  Patient's son found her.  Patient states she feels little bad.    Past Medical History:  Diagnosis Date  . Alcoholism ([place])   . Anxiety   . Arthritis 04/10/2011   hips, shoulders, back  . Asthma   . Bipolar disorder ([place])   . Cardiomegaly   . Carpal tunnel syndrome, bilateral   . Cervical cancer ([place]) 1993   cervical, no treatment done, went away per pt  . Cervical dysplasia or atypia 04/10/2011   '93- once dx.-got pregnant-no intervention, then postpartum, no dysplasia found  . CHF (congestive heart failure) ([place])    no cardiologist 2014 dx, none now  . Chronic hypoxemic respiratory failure ([place])   . Chronic kidney disease    stage 3 kidney disease  . 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 Pathology showed Condyloma, gluteal CONDYLOMA ACUMINATUM.   [name] Kitchen COPD (chronic obstructive pulmonary disease) ([place])   . Depression   . Diabetes mellitus without complication (HCC)    no meds today  . Dyspnea    with actity, sitting  . Fall 12/16/2016  . Fibrocystic breast disease   . GERD (gastroesophageal reflux disease)   . Grade I diastolic dysfunction 01/60/1093   Noted on ECHO  . Hepatitis    hep B-count is low at present,doesn't register  . History of PSVT (paroxysmal supraventricular tachycardia)   . Hypercalcemia   . Hyperlipidemia   . Hypertension   .  Incomplete left bundle branch block (LBBB) 09/24/2018   Noted on EKG  . Lung nodule 11/2017   right and left lung  . Migraine headache    none recent  . Morbid obesity ([place]) 03/27/2006  . Neuropathy   . Pneumonia    walking pneu 15 yrs. ago  . Skin lesion 03/15/2011   In gluteal crease now s/p removal by Dr. [name] of General Surgery on 3/12. Path shows condyloma.     . Sleep apnea 04/10/2011   does not use after loss  . Trigger finger    left third  . Urge incontinence of urine     Home Medications Prior to Admission medications   Medication Sig Start Date End Date Taking? Authorizing Provider  ACCU-CHEK AVIVA PLUS test strip 1 EACH BY OTHER ROUTE 3 (THREE) TIMES DAILY. TEST 3 TIMES DAILY 07/26/21   Concepcion Living, MD  acetaminophen (TYLENOL) 500 MG tablet Take 1,000 mg by mouth 2 (two) times daily.    [provider]  albuterol (VENTOLIN HFA) 108 (90 Base) MCG/ACT inhaler TAKE 2 PUFFS BY MOUTH EVERY 6 HOURS AS NEEDED FOR WHEEZE OR SHORTNESS OF BREATH Patient taking differently: 2 puffs every 6 (six) hours as needed for wheezing or shortness of breath. 03/19/21   Cresenzo, Angelyn Punt, MD  ammonium lactate (LAC-HYDRIN) 12 % lotion APPLY TWICE A DAY AS NEEDED FOR DRY SKIN  Patient taking differently: 1 application. 2 (two) times daily. For dry skin 04/09/21   Gardiner Barefoot, DPM  atorvastatin (LIPITOR) 40 MG tablet Take 1 tablet (40 mg total) by mouth daily. 01/15/21   Concepcion Living, MD  b complex vitamins capsule Take 1 capsule by mouth daily. Patient not taking: Reported on 06/30/2021    [provider]  baclofen (LIORESAL) 10 MG tablet Take 1 tablet (10 mg total) by mouth 3 (three) times daily as needed for muscle spasms. Patient taking differently: Take 10 mg by mouth 3 (three) times daily. 03/24/18   [name], MD  blood glucose meter kit and supplies KIT Dispense based on patient and insurance preference. Use up to four times daily as directed. (FOR ICD-9 250.00,  250.01). 12/20/16   Bonnita Hollow, MD  busPIRone (BUSPAR) 10 MG tablet TAKE 1 TABLET BY MOUTH THREE TIMES A DAY Patient taking differently: Take 10 mg by mouth 3 (three) times daily. 10/30/20   Concepcion Living, MD  calcium carbonate (OSCAL) 1500 (600 Ca) MG TABS tablet Take 1,500 mg by mouth 2 (two) times daily.    [provider]  diclofenac Sodium (VOLTAREN) 1 % GEL APPLY TO AFFECTED AREA AS DIRECTED Patient taking differently: 1 application. 2 (two) times daily. 06/28/21   Cresenzo, Angelyn Punt, MD  diltiazem (CARDIZEM CD) 120 MG 24 hr capsule TAKE 1 CAPSULE BY MOUTH 2 TIMES DAILY. 07/12/21   Lattie Haw, MD  docusate sodium (COLACE) 100 MG capsule Take 200 mg by mouth 2 (two) times daily. Patient not taking: Reported on 06/30/2021    [provider]  fluticasone (FLONASE) 50 MCG/ACT nasal spray PLACE 2 SPRAYS INTO BOTH NOSTRILS TWICE DAILY Patient taking differently: Place 2 sprays into both nostrils 2 (two) times daily. 08/21/20   Concepcion Living, MD  Fluticasone-Umeclidin-Vilant (TRELEGY ELLIPTA) 100-62.5-25 MCG/ACT AEPB Inhale 1 puff into the lungs daily. 07/16/21   Cresenzo, Angelyn Punt, MD  gabapentin (NEURONTIN) 300 MG capsule Take 2 capsules (600 mg total) by mouth 3 (three) times daily. TAKE 2 CAPSULES (600 MG TOTAL) BY MOUTH in the morning and at lunch and take 3 capsules at night Patient taking differently: Take 300-600 mg by mouth 3 (three) times daily. Take 2 capsules (600 mg) every morning and at 1pm; take 3 capsules (900 mg) at bedtime 06/13/21   Cresenzo, Angelyn Punt, MD  hydrOXYzine (ATARAX) 10 MG tablet Take 10 mg by mouth at bedtime. 08/13/21   [provider]  hydrOXYzine (VISTARIL) 50 MG capsule Take 1 capsule (50 mg total) by mouth 3 (three) times daily as needed. Patient taking differently: Take 50 mg by mouth every morning. 05/24/21   Jessy Oto, MD  ketoconazole (NIZORAL) 2 % cream Apply 1 application. topically daily. Patient taking differently: Apply 1  application. topically 3 (three) times daily. Apply to abdominal folds 06/12/21   Gardiner Barefoot, DPM  linaclotide [facility]) 72 MCG capsule Take 72 mcg by mouth daily before breakfast.    [provider]  loratadine (CLARITIN) 10 MG tablet Take 1 tablet (10 mg total) by mouth daily. 06/15/20   Cresenzo, Angelyn Punt, MD  methocarbamol (ROBAXIN) 750 MG tablet Take 1 tablet (750 mg total) by mouth every 6 (six) hours as needed for muscle spasms. Patient taking differently: Take 750 mg by mouth every 6 (six) hours. Scheduled per pt - 3am, 9am, 3pm, 9pm (take with percocet) 05/24/21   Jessy Oto, MD  metolazone (ZAROXOLYN) 2.5 MG tablet TAKE 1  TABLET EVERY OTHER DAY. TAKE 1 TABLET TWICE WEEKLY ON MONDAYS AND THURSDAYS ONLY Patient taking differently: Take 2.5 mg by mouth 2 (two) times a week. Mondays and Thursdays 06/12/20   [name], MD  montelukast (SINGULAIR) 10 MG tablet TAKE 1 TABLET BY MOUTH EVERYDAY AT BEDTIME Strength: 10 mg 07/17/21   Concepcion Living, MD  Multiple Vitamins-Minerals (BARIATRIC MULTIVITAMINS/IRON) CAPS Take 1 capsule by mouth daily. Patient not taking: Reported on 06/30/2021    [provider]  NARCAN 4 MG/0.1ML LIQD nasal spray kit Place 0.4 mg into the nose once as needed (accidental overdose). 02/16/18   [provider]  nystatin (MYCOSTATIN) 100000 UNIT/ML suspension TAKE 5 MLS (500,000 UNITS TOTAL) BY MOUTH 4 (FOUR) TIMES DAILY. SWISH AND SWALLOW. Patient taking differently: Take 5 mLs by mouth every morning. Swish and spit 05/18/21   Cresenzo, Angelyn Punt, MD  omeprazole (PRILOSEC) 20 MG capsule TAKE 1 CAPSULE BY MOUTH EVERY DAY Patient taking differently: 20 mg every morning. 01/09/21   Concepcion Living, MD  oxyCODONE-acetaminophen (PERCOCET) 10-325 MG tablet Take 1 tablet by mouth every 4 (four) hours as needed for pain. Scheduled per pt - 3am, 9am, 3pm, 9pm (take with robaxin) 07/02/21   Viona Gilmore D, NP  Potassium Chloride ER 20 MEQ TBCR Take  20 mEq by mouth every morning. 09/09/20   [provider]  potassium chloride SA (KLOR-CON M) 20 MEQ tablet Take 20 mEq by mouth daily. 08/13/21   [provider]  venlafaxine XR (EFFEXOR-XR) 75 MG 24 hr capsule TAKE 1 CAPSULE BY MOUTH DAILY WITH BREAKFAST. Patient taking differently: 75 mg daily with breakfast. 11/07/20   Concepcion Living, MD      Allergies    Aspirin, Methadone hcl, and Levofloxacin    Review of Systems   Review of Systems  Psychiatric/Behavioral:  Positive for confusion.     Physical Exam Updated Vital Signs BP (!) 180/91 (BP Location: Left Wrist)   Pulse 99   Temp 97.8 F (36.6 C) (Oral)   Resp 11   LMP 01/29/1992   SpO2 95%  Physical Exam Vitals and nursing note reviewed.  Cardiovascular:     Rate and Rhythm: Regular rhythm.  Abdominal:     Tenderness: There is abdominal tenderness.     Comments: Mild lower abdominal tenderness without rebound or guarding.  No hernia palpated.  Musculoskeletal:     Cervical back: Neck supple.     Right lower leg: Edema present.     Left lower leg: Edema present.     Comments: Mild edema bilateral lower extremities.  Neurological:     Mental Status: She is alert.     Comments: Awake and pleasant.  Mental status reportedly improved from prior but reportedly not back at her baseline     ED Results / Procedures / Treatments   Labs (all labs ordered are listed, but only abnormal results are displayed) Labs Reviewed  COMPREHENSIVE METABOLIC PANEL - Abnormal; Notable for the following components:      Result Value   Potassium 3.0 (*)    Glucose, Bld 144 (*)    Total Bilirubin 1.5 (*)    Anion gap 16 (*)    All other components within normal limits  CBC - Abnormal; Notable for the following components:   WBC 17.6 (*)    RBC 6.01 (*)    Hemoglobin 17.3 (*)    HCT 49.3 (*)    RDW 16.4 (*)    All other components  within normal limits  CBG MONITORING, ED - Abnormal; Notable for the following  components:   Glucose-Capillary 169 (*)    All other components within normal limits  ETHANOL  CK  URINALYSIS, ROUTINE W REFLEX MICROSCOPIC  RAPID URINE DRUG SCREEN, HOSP PERFORMED    EKG None  Radiology DG Chest Portable 1 View  Result Date: 08/13/2021 CLINICAL DATA:  Cough, fall, altered mental status EXAM: PORTABLE CHEST 1 VIEW COMPARISON:  10/26/2019 FINDINGS: Patient is mildly rotated on this semi erect examination. The lungs are symmetrically expanded. Bibasilar opacification likely relates to overlying soft tissue and underpenetration. The lungs are clear. No pneumothorax or pleural effusion. Cardiac size is within normal limits when accounting for semi upright positioning. Pulmonary vascularity is normal. No acute bone abnormality. Cervical fusion hardware partially visualized. IMPRESSION: No evidence of acute cardiopulmonary disease. Electronically Signed   By: Fidela Salisbury M.D.   On: 08/13/2021 20:59   CT Head Wo Contrast  Result Date: 08/13/2021 CLINICAL DATA:  Altered level of consciousness EXAM: CT HEAD WITHOUT CONTRAST TECHNIQUE: Contiguous axial images were obtained from the base of the skull through the vertex without intravenous contrast. RADIATION DOSE REDUCTION: This exam was performed according to the departmental dose-optimization program which includes automated exposure control, adjustment of the mA and/or kV according to patient size and/or use of iterative reconstruction technique. COMPARISON:  12/09/2008 FINDINGS: Brain: Stable hypodensities within the periventricular white matter consistent with chronic small vessel ischemic change. No evidence of acute infarct or hemorrhage. Lateral ventricles and midline structures are unremarkable. No acute extra-axial fluid collections. No mass effect. Vascular: No hyperdense vessel or unexpected calcification. Skull: Normal. Negative for fracture or focal lesion. Sinuses/Orbits: No acute finding. Other: None. IMPRESSION: 1. No  acute intracranial process. Electronically Signed   By: Randa Ngo M.D.   On: 08/13/2021 19:57    Procedures Procedures  {Document cardiac monitor, telemetry assessment procedure when appropriate:1}  Medications Ordered in ED Medications  sodium chloride 0.9 % bolus 1,000 mL (1,000 mLs Intravenous New Bag/Given 08/13/21 2317)    ED Course/ Medical Decision Making/ A&P                           Medical Decision Making Amount and/or Complexity of Data Reviewed Labs: ordered. Radiology: ordered.   ***  {Document critical care time when appropriate:1} {Document review of labs and clinical decision tools ie heart score, Chads2Vasc2 etc:1}  {Document your independent review of radiology images, and any outside records:1} {Document your discussion with family members, caretakers, and with consultants:1} {Document social determinants of health affecting pt's care:1} {Document your decision making why or why not admission, treatments were needed:1} Final Clinical Impression(s) / ED Diagnoses Final diagnoses:  None    Rx / DC Orders ED Discharge Orders     None

## 2021-08-13 NOTE — ED Provider Triage Note (Signed)
Emergency Medicine Provider Triage Evaluation Note  [name] , a 56 y.o. female  was evaluated in triage.  EMS was called for patient for confusion and fall.  Patient's son called because he found the patient sitting on the floor of her bedroom covered in urine and naked.  Patient is remained noticed that they found her on the floor yesterday in a similar position patient had told her roommate that she sat down and could not get up.  Per son, patient usually alert and oriented without cognitive deficit.  He also reports that patient has a history of crack cocaine use.  Review of Systems  Cannot perform due to AMS, not complaining of any pain  Physical Exam  LMP 01/29/1992  Gen:   Awake, no distress   Resp:  Normal effort  MSK:   Moves extremities without difficulty  Other:  Patient alert to person and place, but does not know day or the year. Patient is currently naked  Medical Decision Making  Medically screening exam initiated at 7:03 PM.  Appropriate orders placed.  [name] was informed that the remainder of the evaluation will be completed by another provider, this initial triage assessment does not replace that evaluation, and the importance of remaining in the ED until their evaluation is complete.     Kateri Plummer, Hershal Coria 08/13/21 1903

## 2021-08-13 NOTE — ED Notes (Addendum)
X1 Set of blood cultures obtained, sent to lab if needed.

## 2021-08-13 NOTE — ED Notes (Signed)
Dr. Alvino Chapel aware of trending BP.

## 2021-08-13 NOTE — ED Triage Notes (Signed)
Pt arrived via [place] home.   Called out for confusion and fall.   Pts son called bc pt was sitting in the floor of her bedroom in urine and naked.   Pts roommate noticed pt was sitting in the floor yesterday as well.  Pt states she sat down in the floor yesterday and could not get up.   Per son pt is usually A/Ox4.   Today pt does not know the day or year.    Per son pt has a hx of drug abuse (crack)  A/ox2  BP-136/76 P-102 RR-18 96% RA CBG-140

## 2021-08-14 ENCOUNTER — Ambulatory Visit: Payer: 59 | Admitting: Orthopaedic Surgery

## 2021-08-14 DIAGNOSIS — F1491 Cocaine use, unspecified, in remission: Secondary | ICD-10-CM | POA: Diagnosis not present

## 2021-08-14 LAB — URINALYSIS, ROUTINE W REFLEX MICROSCOPIC
Bacteria, UA: NONE SEEN
Bilirubin Urine: NEGATIVE
Glucose, UA: NEGATIVE mg/dL
Hgb urine dipstick: NEGATIVE
Ketones, ur: 80 mg/dL — AB
Nitrite: NEGATIVE
Protein, ur: 100 mg/dL — AB
Specific Gravity, Urine: 1.023 (ref 1.005–1.030)
pH: 5 (ref 5.0–8.0)

## 2021-08-14 LAB — RAPID URINE DRUG SCREEN, HOSP PERFORMED
Amphetamines: NOT DETECTED
Barbiturates: NOT DETECTED
Benzodiazepines: NOT DETECTED
Cocaine: POSITIVE — AB
Opiates: NOT DETECTED
Tetrahydrocannabinol: NOT DETECTED

## 2021-08-14 MED ORDER — FLUTICASONE PROPIONATE 50 MCG/ACT NA SUSP
2.0000 | Freq: Two times a day (BID) | NASAL | Status: DC
  Administered 2021-08-14: 2 via NASAL
  Filled 2021-08-14: qty 16

## 2021-08-14 MED ORDER — LACTATED RINGERS IV SOLN
INTRAVENOUS | Status: DC
Start: 2021-08-14 — End: 2021-08-14
  Administered 2021-08-14: 276.1 mL via INTRAVENOUS

## 2021-08-14 MED ORDER — DILTIAZEM HCL ER COATED BEADS 120 MG PO CP24
120.0000 mg | ORAL_CAPSULE | Freq: Two times a day (BID) | ORAL | Status: DC
  Administered 2021-08-14: 120 mg via ORAL
  Filled 2021-08-14: qty 1

## 2021-08-14 MED ORDER — LACTATED RINGERS IV BOLUS
1000.0000 mL | Freq: Once | INTRAVENOUS | Status: AC
  Administered 2021-08-14: 1000 mL via INTRAVENOUS

## 2021-08-14 MED ORDER — CEPHALEXIN 500 MG PO CAPS: 500.0000 mg | ORAL_CAPSULE | Freq: Four times a day (QID) | ORAL | 0 refills | Status: DC

## 2021-08-14 MED ORDER — FLUTICASONE FUROATE-VILANTEROL 100-25 MCG/ACT IN AEPB
1.0000 | INHALATION_SPRAY | Freq: Every day | RESPIRATORY_TRACT | Status: DC
  Filled 2021-08-14: qty 28

## 2021-08-14 MED ORDER — MONTELUKAST SODIUM 10 MG PO TABS: 10.0000 mg | ORAL_TABLET | Freq: Every day | ORAL | Status: DC

## 2021-08-14 MED ORDER — GABAPENTIN 300 MG PO CAPS: 900.0000 mg | ORAL_CAPSULE | Freq: Every day | ORAL | Status: DC

## 2021-08-14 MED ORDER — POTASSIUM CHLORIDE 10 MEQ/100ML IV SOLN
10.0000 meq | Freq: Once | INTRAVENOUS | Status: AC
  Administered 2021-08-14: 10 meq via INTRAVENOUS

## 2021-08-14 MED ORDER — GABAPENTIN 300 MG PO CAPS: 300.0000 mg | ORAL_CAPSULE | Freq: Three times a day (TID) | ORAL | Status: DC

## 2021-08-14 MED ORDER — GABAPENTIN 300 MG PO CAPS
600.0000 mg | ORAL_CAPSULE | Freq: Two times a day (BID) | ORAL | Status: DC
  Administered 2021-08-14: 600 mg via ORAL
  Filled 2021-08-14: qty 2

## 2021-08-14 MED ORDER — BUSPIRONE HCL 10 MG PO TABS
10.0000 mg | ORAL_TABLET | Freq: Three times a day (TID) | ORAL | Status: DC
  Administered 2021-08-14: 10 mg via ORAL
  Filled 2021-08-14: qty 1

## 2021-08-14 MED ORDER — UMECLIDINIUM BROMIDE 62.5 MCG/ACT IN AEPB
1.0000 | INHALATION_SPRAY | Freq: Every day | RESPIRATORY_TRACT | Status: DC
  Filled 2021-08-14: qty 7

## 2021-08-14 MED ORDER — FENTANYL CITRATE PF 50 MCG/ML IJ SOSY
50.0000 ug | PREFILLED_SYRINGE | Freq: Once | INTRAMUSCULAR | Status: AC
  Administered 2021-08-14: 50 ug via INTRAVENOUS
  Filled 2021-08-14: qty 1

## 2021-08-14 MED ORDER — HYDRALAZINE HCL 20 MG/ML IJ SOLN
10.0000 mg | Freq: Once | INTRAMUSCULAR | Status: AC
  Administered 2021-08-14: 10 mg via INTRAVENOUS

## 2021-08-14 MED ORDER — METOLAZONE 2.5 MG PO TABS: 2.5000 mg | ORAL_TABLET | ORAL | Status: DC

## 2021-08-14 MED ORDER — SODIUM CHLORIDE 0.9 % IV SOLN
1.0000 g | Freq: Once | INTRAVENOUS | Status: AC
  Administered 2021-08-14: 1 g via INTRAVENOUS
  Filled 2021-08-14: qty 10

## 2021-08-14 MED ORDER — ATORVASTATIN CALCIUM 40 MG PO TABS
40.0000 mg | ORAL_TABLET | Freq: Every day | ORAL | Status: DC
  Administered 2021-08-14: 40 mg via ORAL
  Filled 2021-08-14: qty 1

## 2021-08-14 MED ORDER — HYDRALAZINE HCL 20 MG/ML IJ SOLN
INTRAMUSCULAR | Status: AC
  Filled 2021-08-14: qty 1

## 2021-08-14 MED ORDER — VENLAFAXINE HCL ER 75 MG PO CP24
75.0000 mg | ORAL_CAPSULE | Freq: Every day | ORAL | Status: DC
  Administered 2021-08-14: 75 mg via ORAL
  Filled 2021-08-14: qty 1

## 2021-08-14 NOTE — Evaluation (Signed)
Physical Therapy Evaluation Patient Details Name: [name] MRN: [id number] DOB: 1965-03-29 Today's Date: 08/14/2021  History of Present Illness  Pt s a 56yo female brought to Ed 08/13/21 after being found down by son, for about a day. PMH: recent  interbody decompression and fusion  and L1-L2 posterior lumbar fusion,  anxiety, arthritis, bipolar disorder, CKD stage 3, COPD, depression, DM2, alcoholism, cervical fusion 2013, L shoulder surgery 2021,T11-12, T12-L1, L1-2 laminectomies and R knee injection.  Clinical Impression  The patient is awake, sluggish in responses. Patient in midst of nursing care. Mod/max assist  to sit on  side of stretcher, Stands with mod assist and support at [facility] and somewhat propped on therapist shoulder.  Patient  limited in providing information but did state that she was down on floor for a day or 2. No family present. Patient recently  S/P extensive back surgery.  Pt admitted with above diagnosis.  Pt currently with functional limitations due to the deficits listed below (see PT Problem List). Pt will benefit from skilled PT to increase their independence and safety with mobility to allow discharge to the venue listed below.        Recommendations for follow up therapy are one component of a multi-disciplinary discharge planning process, led by the attending physician.  Recommendations may be updated based on patient status, additional functional criteria and insurance authorization.  Follow Up Recommendations Skilled nursing-short term rehab (<3 hours/day) Can patient physically be transported by private vehicle: No    Assistance Recommended at Discharge    Patient can return home with the following  A lot of help with walking and/or transfers;A lot of help with bathing/dressing/bathroom;Help with stairs or ramp for entrance;Assistance with cooking/housework;Assist for transportation    Equipment Recommendations None recommended by PT  Recommendations for  Other Services       Functional Status Assessment Patient has had a recent decline in their functional status and demonstrates the ability to make significant improvements in function in a reasonable and predictable amount of time.     Precautions / Restrictions Precautions Precautions: Fall Precaution Comments: High BP, back surgical wound draining      Mobility  Bed Mobility Overal bed mobility: Needs Assistance Bed Mobility: Supine to Sit, Sit to Supine     Supine to sit: Mod assist Sit to supine: Max assist, Mod assist   General bed mobility comments: assist trunk to sit upright, patient losing grip on bed rail as she had difficulty keeping her grip, Patient able to move legs over bed edge. max assist for legs back onto bed , assist with trunk    Transfers Overall transfer level: Needs assistance Equipment used: Rolling walker (2 wheels) Transfers: Sit to/from Stand Sit to Stand: Mod assist, +2 safety/equipment, +2 physical assistance, From elevated surface           General transfer comment: STeady assist to stand at RW, Knees tended to be hyper extended and  propped against the  stretcher. Patient  leaning on therapist's shoulder  when standing.Took 4 small side steps along the bed.Stood x  3 minutes to  washed up, supported by RW and PT.    Ambulation/Gait               General Gait Details: tba  Stairs            Wheelchair Mobility    Modified Rankin (Stroke Patients Only)       Balance Overall balance assessment: Needs assistance, History of  Falls Sitting-balance support: Feet supported, Bilateral upper extremity supported Sitting balance-Leahy Scale: Poor Sitting balance - Comments: tends to lean forward                                     Pertinent Vitals/Pain Pain Assessment Pain Assessment: Faces Faces Pain Scale: Hurts little more Pain Location: back Pain Descriptors / Indicators: Discomfort Pain Intervention(s):  Limited activity within patient's tolerance, Monitored during session    Home Living Family/patient expects to be discharged to:: Private residence Living Arrangements: Alone   Type of Home: House Home Access: Stairs to enter Entrance Stairs-Rails: Left Entrance Stairs-Number of Steps: 4 steps onto porch   Home Layout: One level Home Equipment: Rollator (4 wheels) Additional Comments: no longer has aide; children can help intermittently some information  from previous encounter.   Prior Function               Mobility Comments: ambulating with Rollator ADLs Comments: unsure     Hand Dominance        Extremity/Trunk Assessment   Upper Extremity Assessment Upper Extremity Assessment: Generalized weakness    Lower Extremity Assessment Lower Extremity Assessment: Generalized weakness (knees tend to be hyperextended when standing)    Cervical / Trunk Assessment Cervical / Trunk Assessment: Other exceptions Cervical / Trunk Exceptions: tends to lean for forward and support on therapist  Communication   Communication:  (speach is slow and halting)  Cognition Arousal/Alertness: Lethargic Behavior During Therapy: Flat affect Overall Cognitive Status: Impaired/Different from baseline Area of Impairment: Orientation, Following commands                 Orientation Level: Time, Situation     Following Commands: Follows one step commands consistently, Follows one step commands with increased time       General Comments: generally able to follow directions for mobility,. slow to respond        General Comments      Exercises     Assessment/Plan    PT Assessment Patient needs continued PT services  PT Problem List Decreased strength;Decreased mobility;Decreased safety awareness;Decreased knowledge of precautions;Decreased activity tolerance;Decreased cognition;Cardiopulmonary status limiting activity;Decreased balance       PT Treatment Interventions  DME instruction;Therapeutic activities;Gait training;Therapeutic exercise;Patient/family education;Functional mobility training    PT Goals (Current goals can be found in the Care Plan section)  Acute Rehab PT Goals Patient Stated Goal: agreed  to get up PT Goal Formulation: With patient Time For Goal Achievement: 08/28/21 Potential to Achieve Goals: Fair    Frequency Min 2X/week     Co-evaluation               AM-PAC PT "6 Clicks" Mobility  Outcome Measure Help needed turning from your back to your side while in a flat bed without using bedrails?: A Lot Help needed moving from lying on your back to sitting on the side of a flat bed without using bedrails?: A Lot Help needed moving to and from a bed to a chair (including a wheelchair)?: A Lot Help needed standing up from a chair using your arms (e.g., wheelchair or bedside chair)?: A Lot Help needed to walk in hospital room?: Total Help needed climbing 3-5 steps with a railing? : Total 6 Click Score: 10    End of Session   Activity Tolerance: Patient limited by fatigue Patient left: in bed;with call bell/phone within reach;with nursing/sitter in  room Nurse Communication: Mobility status PT Visit Diagnosis: Unsteadiness on feet (R26.81);Muscle weakness (generalized) (M62.81);Pain    Time: 0920-0930 PT Time Calculation (min) (ACUTE ONLY): 10 min   Charges:   PT Evaluation $PT Eval Low Complexity: 1 Low          National Office [phone] Weekend [phone]   Claretha Cooper 08/14/2021, 11:28 AM

## 2021-08-14 NOTE — ED Notes (Signed)
Patient consent for her son to be given her lab/urine results.

## 2021-08-14 NOTE — ED Provider Notes (Signed)
Care of the patient assumed at the change of shift. Here for AMS, found on the floor of her house, reportedly since yesterday, naked confused and covered in urine. She was brought by her son who is at bedside. She reports she is living with her ex, but unclear if/when he has been around over the weekend. At change of shift, she is pending UA/UDS and admission. She is also about 6wks post-op from lumbar fusion surgery Physical Exam  BP (!) 185/80   Pulse (!) 103   Temp 97.8 F (36.6 C) (Oral)   Resp (!) 30   LMP 01/29/1992   SpO2 96%   Physical Exam Awake and alert now, improving mental status Abd is benign Lumbar surgical site with several centimeters of dehiscence with granulation tissue but no signs of infection Moves all extremities, no weakness or numbness in LE   Procedures  Procedures  ED Course / MDM   Clinical Course as of 08/14/21 0524  Tue Aug 14, 2021  0141 UDS with cocaine positive. UA with LE and 11-20 WBC could indicate infection. Abx ordered, hospitalist paged.  [CS]  0228 Spoke with Dr. Hal Hope, Hospitalist, who will come evaluate the patient.  [CS]  [id number] Patient seen by Dr. Hal Hope who does not feel she warrants admission at this time. He requests a PT eval in the AM and further dispo pending their recommendations.  [CS]    Clinical Course User Index [CS] Truddie Hidden, MD   Medical Decision Making Amount and/or Complexity of Data Reviewed Labs: ordered. Radiology: ordered.  Risk Prescription drug management.          Truddie Hidden, MD 08/14/21 [phone]

## 2021-08-14 NOTE — Discharge Instructions (Addendum)
Follow-up with your primary care doctor to be rechecked.  Take antibiotics as prescribed.

## 2021-08-14 NOTE — ED Notes (Signed)
Patient son is been call to pick the patient up. Patient son will be here in the next 35 minute.

## 2021-08-14 NOTE — ED Provider Notes (Signed)
Patient was seen by Dr. Alvino Chapel and Dr. Karle Starch last night.  Plan was for possible hospital admission but Dr. Hal Hope medical service evaluated the patient and did not feel that the patient needed to be admitted to the hospital.  Physical therapy eval was requested for possible nursing home placement.  Patient has decided she does not want to go to a nursing home.  Patient is alert and awake.  Will discharge home with prescription for Keflex   Dorie Rank, MD 08/14/21 1231

## 2021-08-14 NOTE — Progress Notes (Signed)
.Transition of Care [facility]) - Emergency Department Mini Assessment   Patient Details  Name: [name] MRN: [id number] Date of Birth: 1965/10/03  Transition of Care [facility]) CM/SW Contact:    Illene Regulus, LCSW Phone Number: 08/14/2021, 12:18 PM   Clinical Narrative: TOC cSW spoke with pt to discuss physical Therapy recommendations for SNF placement. Pt stated, "I want to go home." Pt stated she has a rollator and bedside commode in the home. Pt is agreeable to [facility] services. CSW informed pt about the barriers to receiving [facility] services with her insurance. Pt stated she is ok with OP PT if she is unable to get [facility] services.  CSW sent out referrals to [facility], [facility], and [facility] for [facility] services, they all have declined pt.    ED Mini Assessment: What brought you to the Emergency Department? : AMS  Barriers to Discharge: Continued Medical Work up        Interventions which prevented an admission or readmission: SNF Placement, Home Health Consult or Services, Transportation Screening, SUD counseling    Patient Tax inspector        ,            CMS Medicare.gov Compare Post Acute Care list provided to:: Patient Choice offered to / list presented to : Patient  Admission diagnosis:  Found on Floor/AMS Patient Active Problem List   Diagnosis Date Noted   Lumbar stenosis with neurogenic claudication 06/29/2021   Lumbar radiculopathy 06/29/2021   Biceps tendinopathy of right upper extremity 05/03/2021   Irregular heartbeat 04/11/2021   Neuropathic pain of foot 03/28/2021   Chronic congestion of paranasal sinus 03/28/2021   Impingement syndrome of right shoulder 03/08/2021   Arthritis of right acromioclavicular joint 03/08/2021   Acute medial meniscus tear of right knee 03/08/2021   Acute lateral meniscus tear of right knee 03/08/2021   Spinal stenosis of thoracic region with radiculopathy    Bilateral leg numbness    Spinal stenosis of lumbar  region with neurogenic claudication    Unilateral primary osteoarthritis, right knee    Impaired ambulation 01/10/2021   Status post lumbar laminectomy 10/03/2020   Cubital tunnel syndrome on left 05/02/2020   Vaginal discharge 01/20/2020   Prolapse of anterior vaginal wall 11/10/2019   Skin ulcer of left thigh, limited to breakdown of skin ([place]) 09/09/2019   Ulnar nerve compression 08/06/2019   Diabetic neuropathy associated with type 2 diabetes mellitus ([place]) 08/06/2019   Body mass index 50.0-59.9, adult ([place]) 06/12/2019   Impingement syndrome of left shoulder 05/07/2019   Polyp of colon    Polyp of ascending colon    Rectal discomfort 02/12/2019   Osteoarthritis of right hip 02/12/2019   Nontraumatic tear of left supraspinatus tendon 12/30/2018   Gout 12/17/2018   Nontraumatic complete tear of right rotator cuff 12/08/2018   Rotator cuff tear, right 12/01/2018   Tobacco abuse 08/21/2018   Right groin pain 07/20/2018   Depressed mood 07/13/2018   Paroxysmal SVT (supraventricular tachycardia) ([place]) 03/20/2018   Pure hypercholesterolemia 03/20/2018   Muscle cramping 02/25/2018   Nodule of upper lobe of right lung 02/13/2018   Falls, subsequent encounter 01/30/2018   Pain in left foot 11/04/2017   Essential hypertension 10/27/2017   Solitary pulmonary nodule 10/07/2017   Restrictive lung disease secondary to obesity 10/01/2017   Polycythemia, secondary 10/01/2017   Chronic viral hepatitis B without delta-agent ([place]) 07/08/2017   COPD (chronic obstructive pulmonary disease) with chronic bronchitis ([place]) 06/27/2017   Chronic  kidney disease (CKD), stage IV (severe) ([place]) 01/14/2017   Decubitus ulcer 12/13/2016   Type 2 diabetes mellitus with stage 3 chronic kidney disease, with long-term current use of insulin ([place]) 12/12/2016   Urge incontinence of urine 12/05/2016   Trigger finger, left index finger 07/15/2016   Back pain 04/07/2014   Morbid obesity ([place]) 10/02/2012   Chronic  diastolic CHF (congestive heart failure) ([place]) 04/07/2012   Skin lesion of scalp 03/15/2011   GERD 01/26/2010   Obstructive sleep apnea treated with BiPAP 02/03/2008   FIBROCYSTIC BREAST DISEASE 10/28/2006   Hyperlipidemia 03/27/2006   Obesity hypoventilation syndrome ([place]) 03/27/2006   Depression with anxiety 03/27/2006   PCP:  Alen Bleacher, MD Pharmacy:   [place], [place] [address] [place] [address] Phone: [phone] Fax: [phone]

## 2021-08-15 ENCOUNTER — Institutional Professional Consult (permissible substitution): Payer: 59 | Admitting: Plastic Surgery

## 2021-08-15 ENCOUNTER — Other Ambulatory Visit: Payer: Self-pay | Admitting: *Deleted

## 2021-08-15 MED ORDER — TRELEGY ELLIPTA 100-62.5-25 MCG/ACT IN AEPB: 1.0000 | INHALATION_SPRAY | Freq: Every day | RESPIRATORY_TRACT | 3 refills | Status: DC

## 2021-08-15 MED ORDER — MONTELUKAST SODIUM 10 MG PO TABS
ORAL_TABLET | ORAL | 3 refills | Status: DC
Start: 2021-08-15 — End: 2022-10-17

## 2021-08-20 ENCOUNTER — Ambulatory Visit (HOSPITAL_BASED_OUTPATIENT_CLINIC_OR_DEPARTMENT_OTHER): Payer: 59 | Admitting: Cardiology

## 2021-08-20 ENCOUNTER — Encounter (HOSPITAL_BASED_OUTPATIENT_CLINIC_OR_DEPARTMENT_OTHER): Payer: Self-pay

## 2021-08-21 ENCOUNTER — Telehealth: Payer: Self-pay | Admitting: Physical Medicine and Rehabilitation

## 2021-08-21 NOTE — Telephone Encounter (Signed)
Patient called. She would like an appointment with Dr. Newton.  

## 2021-08-24 ENCOUNTER — Ambulatory Visit (INDEPENDENT_AMBULATORY_CARE_PROVIDER_SITE_OTHER): Payer: 59 | Admitting: Orthopaedic Surgery

## 2021-08-24 DIAGNOSIS — M1611 Unilateral primary osteoarthritis, right hip: Secondary | ICD-10-CM | POA: Diagnosis not present

## 2021-08-24 NOTE — Progress Notes (Signed)
Office Visit Note   Patient: [name]           Date of Birth: 1965-11-23           MRN: [id number] Visit Date: 08/24/2021              Requested by: Alen Bleacher, MD [address] [place],  [place] [address] PCP: Alen Bleacher, MD   Assessment & Plan: Visit Diagnoses:  1. Unilateral primary osteoarthritis, right hip     Plan: Impression is right hip end-stage degenerative joint disease.  We again discussed various treatment options to include repeat cortisone injection as well as total hip arthroplasty.  She is currently recovering from lumbar back surgery and is scheduled for what sounds like panniculectomy next month.  She would like to undergo another cortisone injection for now.  She will follow-up with [place] when she is ready for total hip arthroplasty.  She does understand that she will need to get to a BMI of less than 40.0 in order to proceed with surgery.  Follow-Up Instructions: Return if symptoms worsen or fail to improve.   Orders:  No orders of the defined types were placed in this encounter.  No orders of the defined types were placed in this encounter.     Procedures: No procedures performed   Clinical Data: No additional findings.   Subjective: Chief Complaint  Patient presents with   Right Hip - Pain     HPI patient is a pleasant 56 year old female who comes in today with recurrent right hip pain.  History of advanced degenerative joint disease.  She has been seeing [place] for this for several months.  She has seen Dr. Ernestina Patches in the past where she has undergone right hip cortisone injection.  She notes that she has been getting a few months relief following the injections.  Her pain has returned.  Pain she has is primarily to the groin but does note pain that radiates to the right lower back and buttock.  Occasional pain at night.  She has been taking oxycodone with some relief.  She recently underwent lumbar surgery by Dr. Trenton Gammon on June 2 and is scheduled  for what sounds like panniculectomy next month.  Review of Systems as detailed in HPI.  All others reviewed and are negative.   Objective: Vital Signs: LMP 01/29/1992   Physical Exam well-developed well-nourished female no acute distress.  Alert and oriented x3.  Ortho Exam right hip exam reveals painful logroll and hip flexion.  She is neurovascular intact distally.  Specialty Comments:  No specialty comments available.  Imaging: No new imaging   PMFS History: Patient Active Problem List   Diagnosis Date Noted   Lumbar stenosis with neurogenic claudication 06/29/2021   Lumbar radiculopathy 06/29/2021   Biceps tendinopathy of right upper extremity 05/03/2021   Irregular heartbeat 04/11/2021   Neuropathic pain of foot 03/28/2021   Chronic congestion of paranasal sinus 03/28/2021   Impingement syndrome of right shoulder 03/08/2021   Arthritis of right acromioclavicular joint 03/08/2021   Acute medial meniscus tear of right knee 03/08/2021   Acute lateral meniscus tear of right knee 03/08/2021   Spinal stenosis of thoracic region with radiculopathy    Bilateral leg numbness    Spinal stenosis of lumbar region with neurogenic claudication    Unilateral primary osteoarthritis, right knee    Impaired ambulation 01/10/2021   Status post lumbar laminectomy 10/03/2020   Cubital tunnel syndrome on left 05/02/2020   Vaginal  discharge 01/20/2020   Prolapse of anterior vaginal wall 11/10/2019   Skin ulcer of left thigh, limited to breakdown of skin ([place]) 09/09/2019   Ulnar nerve compression 08/06/2019   Diabetic neuropathy associated with type 2 diabetes mellitus ([place]) 08/06/2019   Body mass index 50.0-59.9, adult ([place]) 06/12/2019   Impingement syndrome of left shoulder 05/07/2019   Polyp of colon    Polyp of ascending colon    Rectal discomfort 02/12/2019   Osteoarthritis of right hip 02/12/2019   Nontraumatic tear of left supraspinatus tendon 12/30/2018   Gout 12/17/2018    Nontraumatic complete tear of right rotator cuff 12/08/2018   Rotator cuff tear, right 12/01/2018   Tobacco abuse 08/21/2018   Right groin pain 07/20/2018   Depressed mood 07/13/2018   Paroxysmal SVT (supraventricular tachycardia) ([place]) 03/20/2018   Pure hypercholesterolemia 03/20/2018   Muscle cramping 02/25/2018   Nodule of upper lobe of right lung 02/13/2018   Falls, subsequent encounter 01/30/2018   Pain in left foot 11/04/2017   Essential hypertension 10/27/2017   Solitary pulmonary nodule 10/07/2017   Restrictive lung disease secondary to obesity 10/01/2017   Polycythemia, secondary 10/01/2017   Chronic viral hepatitis B without delta-agent ([place]) 07/08/2017   COPD (chronic obstructive pulmonary disease) with chronic bronchitis ([place]) 06/27/2017   Chronic kidney disease (CKD), stage IV (severe) ([place]) 01/14/2017   Decubitus ulcer 12/13/2016   Type 2 diabetes mellitus with stage 3 chronic kidney disease, with long-term current use of insulin ([place]) 12/12/2016   Urge incontinence of urine 12/05/2016   Trigger finger, left index finger 07/15/2016   Back pain 04/07/2014   Morbid obesity ([place]) 10/02/2012   Chronic diastolic CHF (congestive heart failure) ([place]) 04/07/2012   Skin lesion of scalp 03/15/2011   GERD 01/26/2010   Obstructive sleep apnea treated with BiPAP 02/03/2008   FIBROCYSTIC BREAST DISEASE 10/28/2006   Hyperlipidemia 03/27/2006   Obesity hypoventilation syndrome ([place]) 03/27/2006   Depression with anxiety 03/27/2006   Past Medical History:  Diagnosis Date   Alcoholism ([place])    Anxiety    Arthritis 04/10/2011   hips, shoulders, back   Asthma    Bipolar disorder ([place])    Cardiomegaly    Carpal tunnel syndrome, bilateral    Cervical cancer ([place]) 1993   cervical, no treatment done, went away per pt   Cervical dysplasia or atypia 04/10/2011   '93- once dx.-got pregnant-no intervention, then postpartum, no dysplasia found   CHF (congestive heart failure) ([place])     no cardiologist 2014 dx, none now   Chronic hypoxemic respiratory failure ([place])    Chronic kidney disease    stage 3 kidney disease   Condyloma - gluteal cleft 04/09/2011   Removed by general surgery. Pathology showed Condyloma, gluteal CONDYLOMA ACUMINATUM.    COPD (chronic obstructive pulmonary disease) ([place])    Depression    Diabetes mellitus without complication ([place])    no meds today   Dyspnea    with actity, sitting   Fall 12/16/2016   Fibrocystic breast disease    GERD (gastroesophageal reflux disease)    Grade I diastolic dysfunction 72/53/6644   Noted on ECHO   Hepatitis    hep B-count is low at present,doesn't register   History of PSVT (paroxysmal supraventricular tachycardia)    Hypercalcemia    Hyperlipidemia    Hypertension    Incomplete left bundle branch block (LBBB) 09/24/2018   Noted on EKG   Lung nodule 11/2017   right and left lung   Migraine headache  none recent   Morbid obesity ([place]) 03/27/2006   Neuropathy    Pneumonia    walking pneu 15 yrs. ago   Skin lesion 03/15/2011   In gluteal crease now s/p removal by Dr. [name] of General Surgery on 3/12. Path shows condyloma.      Sleep apnea 04/10/2011   does not use after loss   Trigger finger    left third   Urge incontinence of urine     Family History  Problem Relation Age of Onset   Pulmonary embolism Mother    Stroke Father    Alcohol abuse Father    Pneumonia Father    Hypertension Father    Breast cancer Maternal Aunt    Hypertension Other    Diabetes Other        Aunts and cousins   Asthma Sister    Depression Sister    Arthritis Sister    Sickle cell anemia Son     Past Surgical History:  Procedure Laterality Date   ANAL FISTULECTOMY  04/17/2011   Procedure: FISTULECTOMY ANAL;  Surgeon: Imogene Burn. [name], MD;  Location: [facility];  Service: General;  Laterality: N/A;  Excision of Condyloma Gluteal Cleft    ANAL RECTAL MANOMETRY N/A 03/26/2019   Procedure: ANO RECTAL MANOMETRY;   Surgeon: Thornton Park, MD;  [facility]: [facility];  Service: Gastroenterology;  Laterality: N/A;   BACK SURGERY  2009   pt thinks it was an "interbody fusion"   BIOPSY  03/09/2019   Procedure: BIOPSY;  Surgeon: Thornton Park, MD;  Location: [facility];  Service: Gastroenterology;;   BOTOX INJECTION N/A 12/29/2018   Procedure: BOTOX INJECTION(100 UNITS)  WITH CYSTOSCOPY;  Surgeon: Ceasar Mons, MD;  [facility]: [facility];  Service: Urology;  Laterality: N/A;   BOTOX INJECTION N/A 07/27/2019   Procedure: BOTOX INJECTION WITH CYSTOSCOPY;  Surgeon: Ceasar Mons, MD;  [facility];  Service: Urology;  Laterality: N/A;  ONLY NEEDS 30 MIN   BOTOX INJECTION N/A 05/02/2021   Procedure: BOTOX INJECTION WITH CYSTOSCOPY;  Surgeon: Ceasar Mons, MD;  Location: [facility];  Service: Urology;  Laterality: N/A;  ONLY NEEDS 30 MIN   BREAST CYST EXCISION     bilateral breast, 3 cysts removed from each breast   BREAST EXCISIONAL BIOPSY Right    x 3   BREAST EXCISIONAL BIOPSY Left    x 3   CARPAL TUNNEL RELEASE Bilateral    Cervical biospy     CERVICAL FUSION  04/10/2011   x3- cervical fusion with plating and screws-Dr. [name]   COLONOSCOPY WITH PROPOFOL N/A 07/06/2015   Procedure: COLONOSCOPY WITH PROPOFOL;  Surgeon: [name], MD;  [facility];  Service: Endoscopy;  Laterality: N/A;   COLONOSCOPY WITH PROPOFOL N/A 03/09/2019   Procedure: COLONOSCOPY WITH PROPOFOL;  Surgeon: Thornton Park, MD;  [facility];  Service: Gastroenterology;  Laterality: N/A;   DOPPLER ECHOCARDIOGRAPHY  04/06/2012   AT [place] 55-60%   FOOT SURGERY Left 2018   FRACTURE SURGERY     little toe left foot   I & D EXTREMITY Left 12/18/2016   Procedure: IRRIGATION AND DEBRIDEMENT LEFT FOOT, CLOSURE;  Surgeon: [name], MD;  Location: [facility];  Service: Orthopedics;  Laterality: Left;   INJECTION KNEE Right 10/03/2020   Procedure:  KNEE INJECTION;  Surgeon: Jessy Oto, MD;  Location: [facility];  Service: Orthopedics;  Laterality: Right;   KNEE ARTHROSCOPY WITH MEDIAL MENISECTOMY Right 03/21/2021  Procedure: RIGHT KNEE ARTHROSCOPY WITH MEDIAL AND PARTIAL LATERAL MENISCECTOMY;  Surgeon: [name], MD;  Location: [place];  Service: Orthopedics;  Laterality: Right;   LAPAROSCOPIC GASTRIC SLEEVE RESECTION N/A 05/08/2020   Procedure: LAPAROSCOPIC GASTRIC SLEEVE RESECTION;  Surgeon: Kieth Brightly Arta Bruce, MD;  [facility];  Service: General;  Laterality: N/A;   [place] SURGERY  04/10/2011   x5-Lumbar fusion-retained hardware.(Dr. Louanne Skye)   LUMBAR LAMINECTOMY N/A 10/03/2020   Procedure: THORACOLUMBAR LAMINECTOMIES THORACIC ELEVEN-TWELVE, THORACIC TWELVE-LUMBAR ONE  AND LUMBAT ONE-TWO;  Surgeon: Jessy Oto, MD;  Location: [facility];  Service: Orthopedics;  Laterality: N/A;   LUMBAR PERCUTANEOUS PEDICLE SCREW 2 LEVEL  06/29/2021   Procedure: LUMBAR PERCUTANEOUS PEDICLE SCREW LUMBAR ONE-TWO;  Surgeon: Earnie Larsson, MD;  Location: [place];  Service: Neurosurgery;;   MULTIPLE TOOTH EXTRACTIONS     POLYPECTOMY  03/09/2019   Procedure: POLYPECTOMY;  Surgeon: Thornton Park, MD;  [facility];  Service: Gastroenterology;;   SHOULDER ARTHROSCOPY WITH ROTATOR CUFF REPAIR AND SUBACROMIAL DECOMPRESSION Left 05/07/2019   Procedure: LEFT SHOULDER ARTHROSCOPY WITH DEBRIDEMENT, DISTAL CLAVICLE EXCISION,  SUBACROMIAL DECOMPRESSION AND POSSIBLE ROTATOR CUFF REPAIR;  Surgeon: [name], MD;  Location: [place] [facility];  Service: Orthopedics;  Laterality: Left;   TEE WITHOUT CARDIOVERSION N/A 04/06/2012   Procedure: TRANSESOPHAGEAL ECHOCARDIOGRAM (TEE);  Surgeon: [name], MD;  [facility];  Service: Cardiovascular;  Laterality: N/A;   TRIGGER FINGER RELEASE Right    middle finger   TRIGGER FINGER RELEASE Left 06/28/2016   Procedure: RELEASE TRIGGER FINGER LEFT 3RD FINGER;  Surgeon: [name], MD;   Location: [place];  Service: Orthopedics;  Laterality: Left;   TRIGGER FINGER RELEASE Right 06/12/2016   Procedure: RIGHT INDEX FINGER TRIGGER RELEASE;  Surgeon: [name], MD;  Location: [place];  Service: Orthopedics;  Laterality: Right;   TRIGGER FINGER RELEASE Right 01/19/2018   Procedure: RIGHT RING FINGER TRIGGER FINGER RELEASE;  Surgeon: [name], MD;  Location: [place];  Service: Orthopedics;  Laterality: Right;   TRIGGER FINGER RELEASE Left 05/07/2019   Procedure: RELEASE TRIGGER FINGER LEFT INDEX FINGER;  Surgeon: [name], MD;  Location: [place];  Service: Orthopedics;  Laterality: Left;   UPPER GI ENDOSCOPY N/A 05/08/2020   Procedure: UPPER GI ENDOSCOPY;  Surgeon: Kieth Brightly, Arta Bruce, MD;  [facility];  Service: General;  Laterality: N/A;   Social History   Occupational History   Occupation: disabled    Comment: Disabled  Tobacco Use   Smoking status: Every Day    Packs/day: 1.00    Years: 40.00    Total pack years: 40.00    Types: Cigarettes    Start date: 01/28/1977   Smokeless tobacco: Never  Vaping Use   Vaping Use: Never used  Substance and Sexual Activity   Alcohol use: No    Alcohol/week: 0.0 standard drinks of alcohol   Drug use: Not Currently    Types: Marijuana, "Crack" cocaine    Comment: hx marijuana usemany years ago, Crack -none since 11/2015   Sexual activity: Not Currently    Partners: Male    Birth control/protection: Post-menopausal

## 2021-08-27 ENCOUNTER — Encounter: Payer: Self-pay | Admitting: Family Medicine

## 2021-08-27 ENCOUNTER — Encounter: Payer: Self-pay | Admitting: Student

## 2021-08-27 ENCOUNTER — Ambulatory Visit (INDEPENDENT_AMBULATORY_CARE_PROVIDER_SITE_OTHER): Payer: 59 | Admitting: Family Medicine

## 2021-08-27 DIAGNOSIS — I5032 Chronic diastolic (congestive) heart failure: Secondary | ICD-10-CM

## 2021-08-27 DIAGNOSIS — K130 Diseases of lips: Secondary | ICD-10-CM

## 2021-08-27 DIAGNOSIS — M48062 Spinal stenosis, lumbar region with neurogenic claudication: Secondary | ICD-10-CM | POA: Diagnosis not present

## 2021-08-27 DIAGNOSIS — E1151 Type 2 diabetes mellitus with diabetic peripheral angiopathy without gangrene: Secondary | ICD-10-CM

## 2021-08-27 DIAGNOSIS — J302 Other seasonal allergic rhinitis: Secondary | ICD-10-CM

## 2021-08-27 DIAGNOSIS — B353 Tinea pedis: Secondary | ICD-10-CM

## 2021-08-27 DIAGNOSIS — K146 Glossodynia: Secondary | ICD-10-CM

## 2021-08-27 NOTE — Assessment & Plan Note (Signed)
Mild flare.  Increase metazolone to every other day.  She has only been taking 2 days per week.

## 2021-08-27 NOTE — Assessment & Plan Note (Signed)
Seems to be resolving with the spontaneous drainage last week.  No antibiotics unless worsens.

## 2021-08-27 NOTE — Patient Instructions (Addendum)
I took a couple of meds off your list. I don't think you need antibiotics for your lip.  If it gets worse, call me. Please take your Monday, Thursday medicine, metazolone every other day.  That will help with the swelling. I put in orders for PT and an aide.

## 2021-08-27 NOTE — Progress Notes (Signed)
    SUBJECTIVE:   CHIEF COMPLAINT / HPI:   Lump in lip.  Present x 2 weeks.  Maybe started as a bug bite.  Last week, drained internally with blood and pus.  Now steadily improving. Had back surgery on 6/2.  Wound dehissed.  No nurse visit or PT post surgery.  Asks for aid, Needs help with bath.  She would like PT and can go to outpatient.  Had previous good experience with [facility]. Bilateral leg swelling, Left >right.  Known CHF.  On metazolone but not on loop diuretic.  Careful about salt.  Weight up.  Denies orthopnea or DOE.    OBJECTIVE:   BP 134/68   Pulse 94   Ht '5\' 9"'$  (1.753 m)   Wt 291 lb (132 kg)   LMP 01/29/1992   BMI 42.97 kg/m   Lungs clear Cardiac RRR without m or g Ext 2+ edema.   Lip, mild induration left upper lip  ASSESSMENT/PLAN:   Chronic diastolic CHF (congestive heart failure) (HCC) Mild flare.  Increase metazolone to every other day.  She has only been taking 2 days per week.  Lumbar stenosis with neurogenic claudication Recovering now.  Still needs PT and an aide to hlep bathing.  Infection of lip Seems to be resolving with the spontaneous drainage last week.  No antibiotics unless worsens.     Zenia Resides, MD [place]

## 2021-08-27 NOTE — Assessment & Plan Note (Signed)
Recovering now.  Still needs PT and an aide to hlep bathing.

## 2021-08-28 ENCOUNTER — Telehealth: Payer: Self-pay | Admitting: *Deleted

## 2021-08-28 MED ORDER — AMMONIUM LACTATE 12 % EX LOTN: TOPICAL_LOTION | CUTANEOUS | 0 refills | Status: DC

## 2021-08-28 MED ORDER — KETOCONAZOLE 2 % EX CREA
1.0000 | TOPICAL_CREAM | Freq: Every day | CUTANEOUS | 0 refills | Status: DC
Start: 2021-08-28 — End: 2021-11-21

## 2021-08-28 MED ORDER — ALBUTEROL SULFATE HFA 108 (90 BASE) MCG/ACT IN AERS: INHALATION_SPRAY | RESPIRATORY_TRACT | 3 refills | Status: DC

## 2021-08-28 MED ORDER — LORATADINE 10 MG PO TABS: 10.0000 mg | ORAL_TABLET | Freq: Every day | ORAL | 11 refills | Status: DC

## 2021-08-28 MED ORDER — VENLAFAXINE HCL ER 75 MG PO CP24: 75.0000 mg | ORAL_CAPSULE | Freq: Every day | ORAL | 2 refills | Status: DC

## 2021-08-28 MED ORDER — OMEPRAZOLE 20 MG PO CPDR: 20.0000 mg | DELAYED_RELEASE_CAPSULE | Freq: Every day | ORAL | 2 refills | Status: DC

## 2021-08-28 MED ORDER — NYSTATIN 100000 UNIT/ML MT SUSP: 5.0000 mL | Freq: Four times a day (QID) | OROMUCOSAL | 3 refills | Status: DC

## 2021-08-28 MED ORDER — FLUTICASONE PROPIONATE 50 MCG/ACT NA SUSP: NASAL | 33 refills | Status: DC

## 2021-08-28 NOTE — Chronic Care Management (AMB) (Signed)
  Care Coordination   Note   03/29/9516 Name: RYVER ZADROZNY MRN: [id number] DOB: [birth year]  [name] is a 56 y.o. year old female who sees Alen Bleacher, MD for primary care. I reached out to [name] by phone today to offer care coordination services.  Ms. Toops was given information about Care Coordination services today including:   The Care Coordination services include support from the care team which includes your Nurse Coordinator, Clinical Social Worker, or Pharmacist.  The Care Coordination team is here to help remove barriers to the health concerns and goals most important to you. Care Coordination services are voluntary, and the patient may decline or stop services at any time by request to their care team member.   Care Coordination Consent Status: Patient agreed to services and verbal consent obtained.   Follow up plan:  Telephone appointment with care coordination team member scheduled for:  08/29/21  Encounter Outcome:  Pt. Scheduled  [place]  Direct Dial: [phone]

## 2021-08-29 ENCOUNTER — Other Ambulatory Visit: Payer: Self-pay | Admitting: Student

## 2021-08-29 ENCOUNTER — Telehealth: Payer: Self-pay

## 2021-08-29 ENCOUNTER — Ambulatory Visit: Payer: Self-pay | Admitting: Licensed Clinical Social Worker

## 2021-08-29 ENCOUNTER — Ambulatory Visit (HOSPITAL_BASED_OUTPATIENT_CLINIC_OR_DEPARTMENT_OTHER): Payer: 59 | Admitting: Orthopaedic Surgery

## 2021-08-29 DIAGNOSIS — B353 Tinea pedis: Secondary | ICD-10-CM

## 2021-08-29 DIAGNOSIS — E1151 Type 2 diabetes mellitus with diabetic peripheral angiopathy without gangrene: Secondary | ICD-10-CM

## 2021-08-29 MED ORDER — AMMONIUM LACTATE 12 % EX LOTN
TOPICAL_LOTION | CUTANEOUS | 0 refills | Status: DC
Start: 2021-08-29 — End: 2023-05-20

## 2021-08-29 NOTE — Patient Outreach (Addendum)
  Care Coordination   Initial Visit Note   09/03/3815 Name: [name] MRN: [id number] DOB: [birth year]  [name] is a 56 y.o. year old female who sees Alen Bleacher, MD for primary care. I spoke with  [name] by phone today  What matters to the patients health and wellness today?  Patient requesting Home Health. SW will verify order has been placed. SW completed SDOH and Needs assessment. Patient denied needing further services. SW discussed therapy. Patient interested in [place] at the moment   SDOH assessments and interventions completed:  Yes     Care Coordination Interventions Activated:  Yes  Care Coordination Interventions:  Yes, provided   Follow up plan: Follow up call scheduled for Within the next 30 days.    Encounter Outcome:  Pt. Visit Completed

## 2021-08-29 NOTE — Patient Instructions (Signed)
Visit Information  Instructions: patient will work with SW to address concerns related to [name]  Patient was given the following information about care management and care coordination services today, agreed to services, and gave verbal consent: 1.care management/care coordination services include personalized support from designated clinical staff supervised by their physician, including individualized plan of care and coordination with other care providers 2. 24/7 contact phone numbers for assistance for urgent and routine care needs. 3. The patient may stop care management/care coordination services at any time by phone call to the office staff.  Patient verbalizes understanding of instructions and care plan provided today and agrees to view in [place]. Active MyChart status and patient understanding of how to access instructions and care plan via MyChart confirmed with patient.     The care management team will reach out to the patient again over the next 30 days.   Lenor Derrick , MSW Social Worker [facility] Care Management  [phone]

## 2021-08-29 NOTE — Progress Notes (Signed)
Update Ammonium lactate order. 400g

## 2021-08-29 NOTE — Telephone Encounter (Signed)
[place] calls nurse line requesting medication quantity change.   Insurance will only pay for Ammonium Lactate if written for 400 grams.   Please resend if appropriate.

## 2021-09-05 ENCOUNTER — Ambulatory Visit (HOSPITAL_BASED_OUTPATIENT_CLINIC_OR_DEPARTMENT_OTHER): Payer: 59 | Admitting: Orthopaedic Surgery

## 2021-09-05 ENCOUNTER — Ambulatory Visit (INDEPENDENT_AMBULATORY_CARE_PROVIDER_SITE_OTHER): Payer: 59 | Admitting: Orthopaedic Surgery

## 2021-09-05 DIAGNOSIS — M1611 Unilateral primary osteoarthritis, right hip: Secondary | ICD-10-CM | POA: Diagnosis not present

## 2021-09-05 DIAGNOSIS — M25551 Pain in right hip: Secondary | ICD-10-CM

## 2021-09-05 MED ORDER — TRIAMCINOLONE ACETONIDE 40 MG/ML IJ SUSP
80.0000 mg | INTRAMUSCULAR | Status: AC | PRN
  Administered 2021-09-05: 80 mg via INTRA_ARTICULAR

## 2021-09-05 MED ORDER — LIDOCAINE HCL 1 % IJ SOLN
4.0000 mL | INTRAMUSCULAR | Status: AC | PRN
  Administered 2021-09-05: 4 mL

## 2021-09-05 NOTE — Progress Notes (Signed)
Chief Complaint: Right hip pain     History of Present Illness:    [name] is a 56 y.o. female presents today for follow-up with known severe osteoarthritis of the right hip.  She has been seeing Dr. Erlinda Hong for this.  She is here today for referral of the femoral acetabular injection hopefully get her some pain relief as she has previously had these which worked quite well.    Surgical History:   None  PMH/PSH/Family History/Social History/Meds/Allergies:    Past Medical History:  Diagnosis Date  . Alcoholism ([place])   . Anxiety   . Arthritis 04/10/2011   hips, shoulders, back  . Asthma   . Bipolar disorder ([place])   . Cardiomegaly   . Carpal tunnel syndrome, bilateral   . Cervical cancer ([place]) 1993   cervical, no treatment done, went away per pt  . Cervical dysplasia or atypia 04/10/2011   '93- once dx.-got pregnant-no intervention, then postpartum, no dysplasia found  . CHF (congestive heart failure) ([place])    no cardiologist 2014 dx, none now  . Chronic hypoxemic respiratory failure ([place])   . Chronic kidney disease    stage 3 kidney disease  . 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 Pathology showed Condyloma, gluteal CONDYLOMA ACUMINATUM.   [name] Kitchen COPD (chronic obstructive pulmonary disease) ([place])   . Depression   . Diabetes mellitus without complication (HCC)    no meds today  . Dyspnea    with actity, sitting  . Fall 12/16/2016  . Fibrocystic breast disease   . GERD (gastroesophageal reflux disease)   . Grade I diastolic dysfunction 38/88/7579   Noted on ECHO  . Hepatitis    hep B-count is low at present,doesn't register  . History of PSVT (paroxysmal supraventricular tachycardia)   . Hypercalcemia   . Hyperlipidemia   . Hypertension   . Incomplete left bundle branch block (LBBB) 09/24/2018   Noted on EKG  . Lung nodule 11/2017   right and left lung  . Migraine headache    none recent  . Morbid  obesity ([place]) 03/27/2006  . Neuropathy   . Pneumonia    walking pneu 15 yrs. ago  . Skin lesion 03/15/2011   In gluteal crease now s/p removal by Dr. [name] of General Surgery on 3/12. Path shows condyloma.     . Sleep apnea 04/10/2011   does not use after loss  . Trigger finger    left third  . Urge incontinence of urine    Past Surgical History:  Procedure Laterality Date  . ANAL FISTULECTOMY  04/17/2011   Procedure: FISTULECTOMY ANAL;  Surgeon: Imogene Burn. [name], MD;  Location: [facility];  Service: General;  Laterality: N/A;  Excision of Condyloma Gluteal Cleft   . ANAL RECTAL MANOMETRY N/A 03/26/2019   Procedure: ANO RECTAL MANOMETRY;  Surgeon: [place] Park, MD;  [facility]: [facility];  Service: Gastroenterology;  Laterality: N/A;  . BACK SURGERY  2009   pt thinks it was an "interbody fusion"  . BIOPSY  03/09/2019   Procedure: BIOPSY;  Surgeon: [place] Park, MD;  Location: [facility];  Service: Gastroenterology;;  . Lum [place] INJECTION N/A 12/29/2018   Procedure: BOTOX INJECTION(100 UNITS)  WITH CYSTOSCOPY;  Surgeon: Ceasar Mons, MD;  [facility]: [facility];  Service: Urology;  Laterality: N/A;  . BOTOX INJECTION N/A 07/27/2019   Procedure: BOTOX INJECTION WITH CYSTOSCOPY;  Surgeon: Ceasar Mons, MD;  [facility];  Service: Urology;  Laterality: N/A;  ONLY NEEDS 30 MIN  . BOTOX INJECTION N/A 05/02/2021   Procedure: BOTOX INJECTION WITH CYSTOSCOPY;  Surgeon: Ceasar Mons, MD;  [facility];  Service: Urology;  Laterality: N/A;  ONLY NEEDS 30 MIN  . BREAST CYST EXCISION     bilateral breast, 3 cysts removed from each breast  . BREAST EXCISIONAL BIOPSY Right    x 3  . BREAST EXCISIONAL BIOPSY Left    x 3  . CARPAL TUNNEL RELEASE Bilateral   . Cervical biospy    . CERVICAL FUSION  04/10/2011   x3- cervical fusion with plating and screws-Dr. [name]  . COLONOSCOPY WITH PROPOFOL N/A 07/06/2015   Procedure: COLONOSCOPY  WITH PROPOFOL;  Surgeon: [name], MD;  [facility];  Service: Endoscopy;  Laterality: N/A;  . COLONOSCOPY WITH PROPOFOL N/A 03/09/2019   Procedure: COLONOSCOPY WITH PROPOFOL;  Surgeon: [place] Park, MD;  [facility];  Service: Gastroenterology;  Laterality: N/A;  . DOPPLER ECHOCARDIOGRAPHY  04/06/2012   AT [place] 55-60%  . FOOT SURGERY Left 2018  . FRACTURE SURGERY     little toe left foot  . I & D EXTREMITY Left 12/18/2016   Procedure: IRRIGATION AND DEBRIDEMENT LEFT FOOT, CLOSURE;  Surgeon: [name], MD;  Location: [place];  Service: Orthopedics;  Laterality: Left;  . INJECTION KNEE Right 10/03/2020   Procedure: KNEE INJECTION;  Surgeon: Jessy Oto, MD;  Location: [place] [facility];  Service: Orthopedics;  Laterality: Right;  . KNEE ARTHROSCOPY WITH MEDIAL MENISECTOMY Right 03/21/2021   Procedure: RIGHT KNEE ARTHROSCOPY WITH MEDIAL AND PARTIAL LATERAL MENISCECTOMY;  Surgeon: [name], MD;  Location: [place];  Service: Orthopedics;  Laterality: Right;  . LAPAROSCOPIC GASTRIC SLEEVE RESECTION N/A 05/08/2020   Procedure: LAPAROSCOPIC GASTRIC SLEEVE RESECTION;  Surgeon: Kieth Brightly Arta Bruce, MD;  [facility];  Service: General;  Laterality: N/A;  . LUMBAR [place] SURGERY  04/10/2011   x5-Lumbar fusion-retained hardware.(Dr. Louanne Skye)  . LUMBAR LAMINECTOMY N/A 10/03/2020   Procedure: THORACOLUMBAR LAMINECTOMIES THORACIC ELEVEN-TWELVE, THORACIC TWELVE-LUMBAR ONE  AND LUMBAT ONE-TWO;  Surgeon: Jessy Oto, MD;  Location: [place];  Service: Orthopedics;  Laterality: N/A;  . LUMBAR PERCUTANEOUS PEDICLE SCREW 2 LEVEL  06/29/2021   Procedure: LUMBAR PERCUTANEOUS PEDICLE SCREW LUMBAR ONE-TWO;  Surgeon: Earnie Larsson, MD;  Location: [place];  Service: Neurosurgery;;  . MULTIPLE TOOTH EXTRACTIONS    . POLYPECTOMY  03/09/2019   Procedure: POLYPECTOMY;  Surgeon: [place] Park, MD;  [facility];  Service: Gastroenterology;;  . SHOULDER  ARTHROSCOPY WITH ROTATOR CUFF REPAIR AND SUBACROMIAL DECOMPRESSION Left 05/07/2019   Procedure: LEFT SHOULDER ARTHROSCOPY WITH DEBRIDEMENT, DISTAL CLAVICLE EXCISION,  SUBACROMIAL DECOMPRESSION AND POSSIBLE ROTATOR CUFF REPAIR;  Surgeon: [name], MD;  Location: [place];  Service: Orthopedics;  Laterality: Left;  . TEE WITHOUT CARDIOVERSION N/A 04/06/2012   Procedure: TRANSESOPHAGEAL ECHOCARDIOGRAM (TEE);  Surgeon: [name], MD;  Location[facility];  Service: Cardiovascular;  Laterality: N/A;  . TRIGGER FINGER RELEASE Right    middle finger  . TRIGGER FINGER RELEASE Left 06/28/2016   Procedure: RELEASE TRIGGER FINGER LEFT 3RD FINGER;  Surgeon: [name], MD;  Location: [place];  Service: Orthopedics;  Laterality: Left;  . TRIGGER FINGER RELEASE Right 06/12/2016   Procedure: RIGHT INDEX FINGER  TRIGGER RELEASE;  Surgeon: [name], MD;  Location: [place];  Service: Orthopedics;  Laterality: Right;  . TRIGGER FINGER RELEASE Right 01/19/2018   Procedure: RIGHT RING FINGER TRIGGER FINGER RELEASE;  Surgeon: [name], MD;  Location: [place];  Service: Orthopedics;  Laterality: Right;  . TRIGGER FINGER RELEASE Left 05/07/2019   Procedure: RELEASE TRIGGER FINGER LEFT INDEX FINGER;  Surgeon: [name], MD;  Location: [place];  Service: Orthopedics;  Laterality: Left;  . UPPER GI ENDOSCOPY N/A 05/08/2020   Procedure: UPPER GI ENDOSCOPY;  Surgeon: Kieth Brightly, Arta Bruce, MD;  [facility];  Service: General;  Laterality: N/A;   Social History   Socioeconomic History  . Marital status: Divorced    Spouse name: Not on file  . Number of children: 1  . Years of education: 37  . Highest education level: 12th grade  Occupational History  . Occupation: disabled    Comment: Disabled  Tobacco Use  . Smoking status: Every Day    Packs/day: 1.00    Years: 40.00    Total pack years: 40.00    Types: Cigarettes    Start date: 01/28/1977  . Smokeless tobacco: Never  Vaping Use  .  Vaping Use: Never used  Substance and Sexual Activity  . Alcohol use: No    Alcohol/week: 0.0 standard drinks of alcohol  . Drug use: Not Currently    Types: Marijuana, "Crack" cocaine    Comment: hx marijuana usemany years ago, Crack -none since 11/2015  . Sexual activity: Not Currently    Partners: Male    Birth control/protection: Post-menopausal  Other Topics Concern  . Not on file  Social History Narrative   Current Social History       Who lives at home: Patient lives alone in one level home; has 4 steps onto porch with a handrail. Has smoke alarms, no throw rugs. Has elevated toilet. Has shower bench in tub with grab rails.    Transportation: Patient has own vehicle and drives herself    Important Relationships "Family and friends"    Pets: None    Likes to eat varied diet. Seafood, fish, roasted [place] breast, vegetables and salads and fruits.   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 I'm a lovable person."                                                                                                   Social Determinants of Health   Financial Resource Strain: Low Risk  (07/20/2020)   Overall Financial Resource Strain (CARDIA)   . Difficulty of Paying Living Expenses: Not hard at all  Food Insecurity: No Food Insecurity (08/29/2021)   Hunger Vital Sign   . Worried About Estate manager/land agent  of Food in the Last Year: Never true   . Ran Out of Food in the Last Year: Never true  Transportation Needs: No Transportation Needs (08/29/2021)   PRAPARE - Transportation   . Lack of Transportation (Medical): No   . Lack of Transportation (Non-Medical): No  Physical Activity: Inactive (07/20/2020)   Exercise Vital Sign   . Days of Exercise  per Week: 0 days   . Minutes of Exercise per Session: 0 min  Stress: No Stress Concern Present (07/20/2020)   [place]   . Feeling of Stress : Not at all  Social Connections: Moderately Isolated (07/20/2020)   Social Connection and Isolation Panel [NHANES]   . Frequency of Communication with Friends and Family: More than three times a week   . Frequency of Social Gatherings with Friends and Family: More than three times a week   . Attends Religious Services: More than 4 times per year   . Active Member of Clubs or Organizations: No   . Attends Archivist Meetings: Never   . Marital Status: Divorced   Family History  Problem Relation Age of Onset  . Pulmonary embolism Mother   . Stroke Father   . Alcohol abuse Father   . Pneumonia Father   . Hypertension Father   . Breast cancer Maternal Aunt   . Hypertension Other   . Diabetes Other        Aunts and cousins  . Asthma Sister   . Depression Sister   . Arthritis Sister   . Sickle cell anemia Son    Allergies  Allergen Reactions  . Aspirin Other (See Comments)    Kidney disease  . 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 Levofloxacin Itching   Current Outpatient Medications  Medication Sig Dispense Refill  . ACCU-CHEK AVIVA PLUS test strip 1 EACH BY OTHER ROUTE 3 (THREE) TIMES DAILY. TEST 3 TIMES DAILY 300 strip 3  . acetaminophen (TYLENOL) 500 MG tablet Take 1,000 mg by mouth 2 (two) times daily.    [name] Kitchen albuterol (VENTOLIN HFA) 108 (90 Base) MCG/ACT inhaler TAKE 2 PUFFS BY MOUTH EVERY 6 HOURS AS NEEDED FOR WHEEZE OR SHORTNESS OF BREATH Strength: 108 (90 Base) MCG/ACT 8.5 each 3  . ammonium lactate (LAC-HYDRIN) 12 % lotion For dry skin 400 g 0  . atorvastatin (LIPITOR) 40 MG tablet Take 1 tablet (40 mg total) by mouth daily. 90 tablet 3  . b complex vitamins capsule Take 1 capsule by mouth daily. (Patient not taking: Reported  on 06/30/2021)    . baclofen (LIORESAL) 10 MG tablet Take 1 tablet (10 mg total) by mouth 3 (three) times daily as needed for muscle spasms. (Patient taking differently: Take 10 mg by mouth 3 (three) times daily.) 90 each 0  . blood glucose meter kit and supplies KIT Dispense based on patient and insurance preference. Use up to four times daily as directed. (FOR ICD-9 250.00, 250.01). 1 each 0  . busPIRone (BUSPAR) 10 MG tablet TAKE 1 TABLET BY MOUTH THREE TIMES A DAY (Patient taking differently: Take 10 mg by mouth 3 (three) times daily.) 270 tablet 4  . calcium carbonate (OSCAL) 1500 (600 Ca) MG TABS tablet Take 1,500 mg by mouth 2 (two) times daily.    . diclofenac Sodium (VOLTAREN) 1 % GEL APPLY 4grmas TO AFFECTED AREA four times daily AS DIRECTED 500 g 1  . diltiazem (CARDIZEM  CD) 120 MG 24 hr capsule TAKE 1 CAPSULE BY MOUTH 2 TIMES DAILY. (Patient taking differently: Take 120 mg by mouth in the morning and at bedtime.) 180 capsule 2  . docusate sodium (COLACE) 100 MG capsule Take 200 mg by mouth 2 (two) times daily. (Patient not taking: Reported on 06/30/2021)    . fluticasone (FLONASE) 50 MCG/ACT nasal spray PLACE 2 SPRAYS INTO BOTH NOSTRILS TWICE DAILY Strength: 50 MCG/ACT 48 mL 33  . Fluticasone-Umeclidin-Vilant (TRELEGY ELLIPTA) 100-62.5-25 MCG/ACT AEPB Inhale 1 puff into the lungs daily. 60 each 3  . gabapentin (NEURONTIN) 300 MG capsule Take 2 capsules (600 mg total) by mouth 3 (three) times daily. TAKE 2 CAPSULES (600 MG TOTAL) BY MOUTH in the morning and at lunch and take 3 capsules at night (Patient taking differently: Take 300-600 mg by mouth 3 (three) times daily. Take 2 capsules (600 mg) every morning and at 1pm; take 3 capsules (900 mg) at bedtime) 540 capsule 2  . ketoconazole (NIZORAL) 2 % cream Apply 1 Application topically daily. 15 g 0  . linaclotide (LINZESS) 72 MCG capsule Take 72 mcg by mouth daily before breakfast.    . loratadine (CLARITIN) 10 MG tablet Take 1 tablet (10 mg  total) by mouth daily. 30 tablet 11  . metolazone (ZAROXOLYN) 2.5 MG tablet TAKE 1 TABLET EVERY OTHER DAY. TAKE 1 TABLET TWICE WEEKLY ON MONDAYS AND THURSDAYS ONLY (Patient taking differently: Take 2.5 mg by mouth 2 (two) times a week. Mondays and Thursdays) 45 tablet 7  . montelukast (SINGULAIR) 10 MG tablet TAKE 1 TABLET BY MOUTH EVERYDAY AT BEDTIME Strength: 10 mg 90 tablet 3  . Multiple Vitamins-Minerals (BARIATRIC MULTIVITAMINS/IRON) CAPS Take 1 capsule by mouth daily. (Patient not taking: Reported on 06/30/2021)    . NARCAN 4 MG/0.1ML LIQD nasal spray kit Place 0.4 mg into the nose once as needed (accidental overdose).    [name] Kitchen nystatin (MYCOSTATIN) 100000 UNIT/ML suspension Take 5 mLs (500,000 Units total) by mouth 4 (four) times daily. Swish and swallow. 60 mL 3  . omeprazole (PRILOSEC) 20 MG capsule Take 1 capsule (20 mg total) by mouth daily. 30 capsule 2  . oxyCODONE-acetaminophen (PERCOCET) 10-325 MG tablet Take 1 tablet by mouth every 4 (four) hours as needed for pain. Scheduled per pt - 3am, 9am, 3pm, 9pm (take with robaxin) 20 tablet 0  . potassium chloride SA (KLOR-CON M) 20 MEQ tablet Take 20 mEq by mouth daily.    [name] Kitchen venlafaxine XR (EFFEXOR-XR) 75 MG 24 hr capsule Take 1 capsule (75 mg total) by mouth daily with breakfast. 90 capsule 2   No current facility-administered medications for this visit.   No results found.  Review of Systems:   A ROS was performed including pertinent positives and negatives as documented in the HPI.  Physical Exam :   Constitutional: NAD and appears stated age Neurological: Alert and oriented Psych: Appropriate affect and cooperative Last menstrual period 01/29/1992.   Comprehensive Musculoskeletal Exam:    Right hip with pain with 20 degrees internal/external rotation of the hip.  Otherwise distal neurovascular exam is intact.  Imaging:   Xray (3 views right hip): Severe right hip femoral acetabular arthritis    I personally reviewed and  interpreted the radiographs.   Assessment:   56 y.o. female here today for right ultrasound-guided femoral acetabular injection.  I discussed with her risks and benefits of the procedure.  After discussion she has elected to proceed with this.  Plan :    -  Right hip ultrasound-guided injection performed      Procedure Note  Patient: [name]             Date of Birth: 13-Jun-1965           MRN: [id number]             Visit Date: 09/05/2021  Procedures: Visit Diagnoses: No diagnosis found.  Large Joint Inj: R hip joint on 09/05/2021 10:33 AM Indications: pain Details: 22 G 3.5 in needle, ultrasound-guided anterolateral approach  Arthrogram: No  Medications: 4 mL lidocaine 1 %; 80 mg triamcinolone acetonide 40 MG/ML Outcome: tolerated well, no immediate complications Procedure, treatment alternatives, risks and benefits explained, specific risks discussed. Consent was given by the patient. Immediately prior to procedure a time out was called to verify the correct patient, procedure, equipment, support staff and site/side marked as required. Patient was prepped and draped in the usual sterile fashion.         I personally saw and evaluated the patient, and participated in the management and treatment plan.  Vanetta Mulders, MD Attending Physician, Orthopedic Surgery  This document was dictated using Dragon voice recognition software. A reasonable attempt at proof reading has been made to minimize errors.

## 2021-09-06 ENCOUNTER — Ambulatory Visit: Payer: 59

## 2021-09-06 ENCOUNTER — Ambulatory Visit: Payer: 59 | Admitting: Physical Therapy

## 2021-09-06 NOTE — Therapy (Deleted)
OUTPATIENT PHYSICAL THERAPY THORACOLUMBAR EVALUATION   Patient Name: [name] MRN: [id number] DOB:05-Oct-1965, 56 y.o., female Today's Date: 09/06/2021    Past Medical History:  Diagnosis Date   Alcoholism ([place])    Anxiety    Arthritis 04/10/2011   hips, shoulders, back   Asthma    Bipolar disorder ([place])    Cardiomegaly    Carpal tunnel syndrome, bilateral    Cervical cancer ([place]) 1993   cervical, no treatment done, went away per pt   Cervical dysplasia or atypia 04/10/2011   '93- once dx.-got pregnant-no intervention, then postpartum, no dysplasia found   CHF (congestive heart failure) ([place])    no cardiologist 2014 dx, none now   Chronic hypoxemic respiratory failure ([place])    Chronic kidney disease    stage 3 kidney disease   Condyloma - gluteal cleft 04/09/2011   Removed by general surgery. Pathology showed Condyloma, gluteal CONDYLOMA ACUMINATUM.    COPD (chronic obstructive pulmonary disease) ([place])    Depression    Diabetes mellitus without complication ([place])    no meds today   Dyspnea    with actity, sitting   Fall 12/16/2016   Fibrocystic breast disease    GERD (gastroesophageal reflux disease)    Grade I diastolic dysfunction 13/24/4010   Noted on ECHO   Hepatitis    hep B-count is low at present,doesn't register   History of PSVT (paroxysmal supraventricular tachycardia)    Hypercalcemia    Hyperlipidemia    Hypertension    Incomplete left bundle branch block (LBBB) 09/24/2018   Noted on EKG   Lung nodule 11/2017   right and left lung   Migraine headache    none recent   Morbid obesity ([place]) 03/27/2006   Neuropathy    Pneumonia    walking pneu 15 yrs. ago   Skin lesion 03/15/2011   In gluteal crease now s/p removal by Dr. [name] of General Surgery on 3/12. Path shows condyloma.      Sleep apnea 04/10/2011   does not use after loss   Trigger finger    left third   Urge incontinence of urine    Past Surgical History:  Procedure Laterality  Date   ANAL FISTULECTOMY  04/17/2011   Procedure: FISTULECTOMY ANAL;  Surgeon: Imogene Burn. [name], MD;  Location: [facility];  Service: General;  Laterality: N/A;  Excision of Condyloma Gluteal Cleft    ANAL RECTAL MANOMETRY N/A 03/26/2019   Procedure: ANO RECTAL MANOMETRY;  Surgeon: Thornton Park, MD;  [facility];  Service: Gastroenterology;  Laterality: N/A;   BACK SURGERY  2009   pt thinks it was an "interbody fusion"   BIOPSY  03/09/2019   Procedure: BIOPSY;  Surgeon: Thornton Park, MD;  Location: [facility];  Service: Gastroenterology;;   BOTOX INJECTION N/A 12/29/2018   Procedure: BOTOX INJECTION(100 UNITS)  WITH CYSTOSCOPY;  Surgeon: Ceasar Mons, MD;  [facility];  Service: Urology;  Laterality: N/A;   BOTOX INJECTION N/A 07/27/2019   Procedure: BOTOX INJECTION WITH CYSTOSCOPY;  Surgeon: Ceasar Mons, MD;  [facility];  Service: Urology;  Laterality: N/A;  ONLY NEEDS 30 MIN   BOTOX INJECTION N/A 05/02/2021   Procedure: BOTOX INJECTION WITH CYSTOSCOPY;  Surgeon: Ceasar Mons, MD;  Location: [facility];  Service: Urology;  Laterality: N/A;  ONLY NEEDS 30 MIN   BREAST CYST EXCISION     bilateral breast, 3 cysts removed from each breast   BREAST EXCISIONAL BIOPSY Right  x 3   BREAST EXCISIONAL BIOPSY Left    x 3   CARPAL TUNNEL RELEASE Bilateral    Cervical biospy     CERVICAL FUSION  04/10/2011   x3- cervical fusion with plating and screws-Dr. [name]   COLONOSCOPY WITH PROPOFOL N/A 07/06/2015   Procedure: COLONOSCOPY WITH PROPOFOL;  Surgeon: [name], MD;  [facility];  Service: Endoscopy;  Laterality: N/A;   COLONOSCOPY WITH PROPOFOL N/A 03/09/2019   Procedure: COLONOSCOPY WITH PROPOFOL;  Surgeon: Thornton Park, MD;  [facility];  Service: Gastroenterology;  Laterality: N/A;   DOPPLER ECHOCARDIOGRAPHY  04/06/2012   AT [place] 55-60%   FOOT SURGERY Left 2018    FRACTURE SURGERY     little toe left foot   I & D EXTREMITY Left 12/18/2016   Procedure: IRRIGATION AND DEBRIDEMENT LEFT FOOT, CLOSURE;  Surgeon: [name], MD;  Location: [facility];  Service: Orthopedics;  Laterality: Left;   INJECTION KNEE Right 10/03/2020   Procedure: KNEE INJECTION;  Surgeon: Jessy Oto, MD;  Location: [facility];  Service: Orthopedics;  Laterality: Right;   KNEE ARTHROSCOPY WITH MEDIAL MENISECTOMY Right 03/21/2021   Procedure: RIGHT KNEE ARTHROSCOPY WITH MEDIAL AND PARTIAL LATERAL MENISCECTOMY;  Surgeon: [name], MD;  Location: [facility];  Service: Orthopedics;  Laterality: Right;   LAPAROSCOPIC GASTRIC SLEEVE RESECTION N/A 05/08/2020   Procedure: LAPAROSCOPIC GASTRIC SLEEVE RESECTION;  Surgeon: Kieth Brightly Arta Bruce, MD;  [facility];  Service: General;  Laterality: N/A;   [place] SURGERY  04/10/2011   x5-Lumbar fusion-retained hardware.(Dr. Louanne Skye)   LUMBAR LAMINECTOMY N/A 10/03/2020   Procedure: THORACOLUMBAR LAMINECTOMIES THORACIC ELEVEN-TWELVE, THORACIC TWELVE-LUMBAR ONE  AND LUMBAT ONE-TWO;  Surgeon: Jessy Oto, MD;  Location: [facility];  Service: Orthopedics;  Laterality: N/A;   LUMBAR PERCUTANEOUS PEDICLE SCREW 2 LEVEL  06/29/2021   Procedure: LUMBAR PERCUTANEOUS PEDICLE SCREW LUMBAR ONE-TWO;  Surgeon: Earnie Larsson, MD;  Location: [facility];  Service: Neurosurgery;;   MULTIPLE TOOTH EXTRACTIONS     POLYPECTOMY  03/09/2019   Procedure: POLYPECTOMY;  Surgeon: Thornton Park, MD;  [facility];  Service: Gastroenterology;;   SHOULDER ARTHROSCOPY WITH ROTATOR CUFF REPAIR AND SUBACROMIAL DECOMPRESSION Left 05/07/2019   Procedure: LEFT SHOULDER ARTHROSCOPY WITH DEBRIDEMENT, DISTAL CLAVICLE EXCISION,  SUBACROMIAL DECOMPRESSION AND POSSIBLE ROTATOR CUFF REPAIR;  Surgeon: [name], MD;  Location: [facility];  Service: Orthopedics;  Laterality: Left;   TEE WITHOUT CARDIOVERSION N/A 04/06/2012   Procedure: TRANSESOPHAGEAL ECHOCARDIOGRAM (TEE);   Surgeon: [name], MD;  Location: [facility];  Service: Cardiovascular;  Laterality: N/A;   TRIGGER FINGER RELEASE Right    middle finger   TRIGGER FINGER RELEASE Left 06/28/2016   Procedure: RELEASE TRIGGER FINGER LEFT 3RD FINGER;  Surgeon: [name], MD;  Location: [place];  Service: Orthopedics;  Laterality: Left;   TRIGGER FINGER RELEASE Right 06/12/2016   Procedure: RIGHT INDEX FINGER TRIGGER RELEASE;  Surgeon: [name], MD;  Location: [place];  Service: Orthopedics;  Laterality: Right;   TRIGGER FINGER RELEASE Right 01/19/2018   Procedure: RIGHT RING FINGER TRIGGER FINGER RELEASE;  Surgeon: [name], MD;  Location: [facility];  Service: Orthopedics;  Laterality: Right;   TRIGGER FINGER RELEASE Left 05/07/2019   Procedure: RELEASE TRIGGER FINGER LEFT INDEX FINGER;  Surgeon: [name], MD;  Location: [place];  Service: Orthopedics;  Laterality: Left;   UPPER GI ENDOSCOPY N/A 05/08/2020   Procedure: UPPER GI ENDOSCOPY;  Surgeon: Kieth Brightly,  Arta Bruce, MD;  [facility];  Service: General;  Laterality: N/A;   Patient Active Problem List   Diagnosis Date Noted   Infection of lip 08/27/2021   Lumbar stenosis with neurogenic claudication 06/29/2021   Lumbar radiculopathy 06/29/2021   Biceps tendinopathy of right upper extremity 05/03/2021   Irregular heartbeat 04/11/2021   Neuropathic pain of foot 03/28/2021   Chronic congestion of paranasal sinus 03/28/2021   Impingement syndrome of right shoulder 03/08/2021   Arthritis of right acromioclavicular joint 03/08/2021   Acute medial meniscus tear of right knee 03/08/2021   Acute lateral meniscus tear of right knee 03/08/2021   Spinal stenosis of thoracic region with radiculopathy    Bilateral leg numbness    Spinal stenosis of lumbar region with neurogenic claudication    Unilateral primary osteoarthritis, right knee    Impaired ambulation 01/10/2021   Status post lumbar laminectomy 10/03/2020   Cubital tunnel  syndrome on left 05/02/2020   Vaginal discharge 01/20/2020   Prolapse of anterior vaginal wall 11/10/2019   Skin ulcer of left thigh, limited to breakdown of skin ([place]) 09/09/2019   Ulnar nerve compression 08/06/2019   Diabetic neuropathy associated with type 2 diabetes mellitus ([place]) 08/06/2019   Body mass index 50.0-59.9, adult ([place]) 06/12/2019   Impingement syndrome of left shoulder 05/07/2019   Polyp of colon    Polyp of ascending colon    Rectal discomfort 02/12/2019   Osteoarthritis of right hip 02/12/2019   Nontraumatic tear of left supraspinatus tendon 12/30/2018   Gout 12/17/2018   Nontraumatic complete tear of right rotator cuff 12/08/2018   Rotator cuff tear, right 12/01/2018   Tobacco abuse 08/21/2018   Right groin pain 07/20/2018   Depressed mood 07/13/2018   Paroxysmal SVT (supraventricular tachycardia) ([place]) 03/20/2018   Pure hypercholesterolemia 03/20/2018   Muscle cramping 02/25/2018   Nodule of upper lobe of right lung 02/13/2018   Falls, subsequent encounter 01/30/2018   Pain in left foot 11/04/2017   Essential hypertension 10/27/2017   Solitary pulmonary nodule 10/07/2017   Restrictive lung disease secondary to obesity 10/01/2017   Polycythemia, secondary 10/01/2017   Chronic viral hepatitis B without delta-agent ([place]) 07/08/2017   COPD (chronic obstructive pulmonary disease) with chronic bronchitis ([place]) 06/27/2017   Chronic kidney disease (CKD), stage IV (severe) ([place]) 01/14/2017   Decubitus ulcer 12/13/2016   Type 2 diabetes mellitus with stage 3 chronic kidney disease, with long-term current use of insulin ([place]) 12/12/2016   Urge incontinence of urine 12/05/2016   Trigger finger, left index finger 07/15/2016   Back pain 04/07/2014   Morbid obesity ([place]) 10/02/2012   Chronic diastolic CHF (congestive heart failure) ([place]) 04/07/2012   Skin lesion of scalp 03/15/2011   GERD 01/26/2010   Obstructive sleep apnea treated with BiPAP 02/03/2008   FIBROCYSTIC  BREAST DISEASE 10/28/2006   Hyperlipidemia 03/27/2006   Obesity hypoventilation syndrome ([place]) 03/27/2006   Depression with anxiety 03/27/2006    PCP: Alen Bleacher, MD  REFERRING PROVIDER: Zenia Resides, MD  REFERRING DIAG: [phone] (ICD-10-CM) - Lumbar stenosis with neurogenic claudication   Rationale for Evaluation and Treatment Rehabilitation  THERAPY DIAG: Lumbar stenosis with neurogenic claudication    ONSET DATE: 06/29/21  SUBJECTIVE:  SUBJECTIVE STATEMENT: *** PERTINENT HISTORY:  Chronic diastolic CHF (congestive heart failure) (HCC) Mild flare.  Increase metazolone to every other day.  She has only been taking 2 days per week.   Lumbar stenosis with neurogenic claudication Recovering now.  Still needs PT and an aide to hlep bathing.   Infection of lip Seems to be resolving with the spontaneous drainage last week.  No antibiotics unless worsens.  PAIN:  Are you having pain? {OPRCPAIN:27236}   PRECAUTIONS: Back  WEIGHT BEARING RESTRICTIONS No  FALLS:  Has patient fallen in last 6 months? No  LIVING ENVIRONMENT: Lives with: lives with their family Lives in: House/apartment Stairs:  yes Has following equipment at home: {Assistive devices:23999}  OCCUPATION: ***  PLOF: Independent  PATIENT GOALS ***   OBJECTIVE:   DIAGNOSTIC FINDINGS:  CLINICAL DATA:  Anterolateral interbody fusion L1-2   EXAM: LUMBAR SPINE - 2-3 VIEW   COMPARISON:  Lumbar spine CT 05/25/2021   FINDINGS: Two intraoperative views of the lumbar spine demonstrate the posterolateral rod and pedicle screw apparatus noted to extend from the L2 level caudad. There is a new intervertebral spacer at the L1-2 level along with bilateral pedicle screws at L1. No complicating feature observed.    IMPRESSION: 1. Interval pedicle screw placement at L1 with interbody spacer at L1-2.     Electronically Signed   By: Van Clines M.D.   On: 06/29/2021 11:50  PATIENT SURVEYS:  FOTO ***  SCREENING FOR RED FLAGS: Bowel or bladder incontinence: No  COGNITION:  Overall cognitive status: Within functional limits for tasks assessed     SENSATION: Not tested  MUSCLE LENGTH: Hamstrings: Right *** deg; Left *** deg Marcello Moores test: Right *** deg; Left *** deg  POSTURE: {posture:25561}  PALPATION: ***  LUMBAR ROM:   {AROM/PROM:27142}  A/PROM  eval  Flexion   Extension   Right lateral flexion   Left lateral flexion   Right rotation   Left rotation    (Blank rows = not tested)  LOWER EXTREMITY ROM:     {AROM/PROM:27142}  Right eval Left eval  Hip flexion    Hip extension    Hip abduction    Hip adduction    Hip internal rotation    Hip external rotation    Knee flexion    Knee extension    Ankle dorsiflexion    Ankle plantarflexion    Ankle inversion    Ankle eversion     (Blank rows = not tested)  LOWER EXTREMITY MMT:    MMT Right eval Left eval  Hip flexion    Hip extension    Hip abduction    Hip adduction    Hip internal rotation    Hip external rotation    Knee flexion    Knee extension    Ankle dorsiflexion    Ankle plantarflexion    Ankle inversion    Ankle eversion     (Blank rows = not tested)  LUMBAR SPECIAL TESTS:  {lumbar special test:25242}  FUNCTIONAL TESTS:  {Functional tests:24029}  GAIT: Distance walked: 53fx2 Assistive device utilized: {Assistive devices:23999} Level of assistance: {Levels of assistance:24026} Comments: ***    TODAY'S TREATMENT  ***   PATIENT EDUCATION:  Education details: Discussed eval findings, rehab rationale and POC and patient is in agreement  Person educated: Patient Education method: Explanation and Handouts Education comprehension: verbalized understanding and needs further  education   HOME EXERCISE PROGRAM: ***  ASSESSMENT:  CLINICAL IMPRESSION: Patient is a 56y.o. female  who was seen today for physical therapy evaluation and treatment for ***.    OBJECTIVE IMPAIRMENTS {opptimpairments:25111}.   ACTIVITY LIMITATIONS {activitylimitations:27494}  PARTICIPATION LIMITATIONS: {participationrestrictions:25113}  PERSONAL FACTORS {Personal factors:25162} are also affecting patient's functional outcome.   REHAB POTENTIAL: {rehabpotential:25112}  CLINICAL DECISION MAKING: {clinical decision making:25114}  EVALUATION COMPLEXITY: {Evaluation complexity:25115}   GOALS: Goals reviewed with patient? {yes/no:20286}  SHORT TERM GOALS: Target date: {follow up:25551}  *** Baseline: Goal status: {GOALSTATUS:25110}  2.  *** Baseline:  Goal status: {GOALSTATUS:25110}  3.  *** Baseline:  Goal status: {GOALSTATUS:25110}  4.  *** Baseline:  Goal status: {GOALSTATUS:25110}  5.  *** Baseline:  Goal status: {GOALSTATUS:25110}  6.  *** Baseline:  Goal status: {GOALSTATUS:25110}  LONG TERM GOALS: Target date: {follow up:25551}  *** Baseline:  Goal status: {GOALSTATUS:25110}  2.  *** Baseline:  Goal status: {GOALSTATUS:25110}  3.  *** Baseline:  Goal status: {GOALSTATUS:25110}  4.  *** Baseline:  Goal status: {GOALSTATUS:25110}  5.  *** Baseline:  Goal status: {GOALSTATUS:25110}  6.  *** Baseline:  Goal status: {GOALSTATUS:25110}   PLAN: PT FREQUENCY: {rehab frequency:25116}  PT DURATION: {rehab duration:25117}  PLANNED INTERVENTIONS: {rehab planned interventions:25118::"Therapeutic exercises","Therapeutic activity","Neuromuscular re-education","Balance training","Gait training","Patient/Family education","Self Care","Joint mobilization"}.  PLAN FOR NEXT SESSION: ***   Lanice Shirts, PT 09/06/2021, 11:34 AM

## 2021-09-07 ENCOUNTER — Other Ambulatory Visit: Payer: Self-pay | Admitting: Cardiology

## 2021-09-07 ENCOUNTER — Encounter: Payer: 59 | Admitting: Physical Medicine and Rehabilitation

## 2021-09-07 ENCOUNTER — Ambulatory Visit: Payer: 59

## 2021-09-07 NOTE — Telephone Encounter (Signed)
Rx(s) sent to pharmacy electronically.  

## 2021-09-11 ENCOUNTER — Ambulatory Visit: Payer: 59

## 2021-09-12 ENCOUNTER — Ambulatory Visit (INDEPENDENT_AMBULATORY_CARE_PROVIDER_SITE_OTHER): Payer: 59 | Admitting: Podiatry

## 2021-09-12 ENCOUNTER — Encounter: Payer: Self-pay | Admitting: Podiatry

## 2021-09-12 DIAGNOSIS — M79675 Pain in left toe(s): Secondary | ICD-10-CM | POA: Diagnosis not present

## 2021-09-12 DIAGNOSIS — N184 Chronic kidney disease, stage 4 (severe): Secondary | ICD-10-CM

## 2021-09-12 DIAGNOSIS — E1142 Type 2 diabetes mellitus with diabetic polyneuropathy: Secondary | ICD-10-CM

## 2021-09-12 DIAGNOSIS — B351 Tinea unguium: Secondary | ICD-10-CM

## 2021-09-12 DIAGNOSIS — M79674 Pain in right toe(s): Secondary | ICD-10-CM

## 2021-09-12 NOTE — Progress Notes (Signed)
This patient returns to my office for at risk foot care.  This patient requires this care by a professional since this patient will be at risk due to having chronic kidney disease and type 2 diabetes.  This patient is unable to cut nails herself since the patient cannot reach her nails.These nails are painful walking and wearing shoes.  This patient presents for at risk foot care today.  General Appearance  Alert, conversant and in no acute stress.  Vascular  Dorsalis pedis and posterior tibial  pulses are palpable  bilaterally.  Capillary return is within normal limits  bilaterally. Temperature is within normal limits  bilaterally.  Neurologic  Senn-Weinstein monofilament wire test diminished  bilaterally. Muscle power within normal limits bilaterally.  Nails Thick disfigured discolored nails with subungual debris  from hallux to fifth toes bilaterally. No evidence of bacterial infection or drainage bilaterally.  Orthopedic  No limitations of motion  feet .  No crepitus or effusions noted.  No bony pathology or digital deformities noted.  HAV  Left foot.  Skin  normotropic skin with no porokeratosis noted bilaterally.  No signs of infections or ulcers noted.     Onychomycosis  Pain in right toes  Pain in left toes  Consent was obtained for treatment procedures.   Mechanical debridement of nails 1-5  bilaterally performed with a nail nipper.  Filed with dremel without incident.    Return office visit   3 months                 Told patient to return for periodic foot care and evaluation due to potential at risk complications.   Gardiner Barefoot DPM

## 2021-09-18 ENCOUNTER — Institutional Professional Consult (permissible substitution): Payer: 59 | Admitting: Plastic Surgery

## 2021-09-27 ENCOUNTER — Other Ambulatory Visit: Payer: Self-pay | Admitting: Student

## 2021-10-03 ENCOUNTER — Other Ambulatory Visit: Payer: Self-pay | Admitting: Family Medicine

## 2021-10-03 DIAGNOSIS — J302 Other seasonal allergic rhinitis: Secondary | ICD-10-CM

## 2021-10-06 ENCOUNTER — Other Ambulatory Visit: Payer: Self-pay | Admitting: Family Medicine

## 2021-10-08 ENCOUNTER — Encounter: Payer: 59 | Admitting: Skilled Nursing Facility1

## 2021-10-10 ENCOUNTER — Ambulatory Visit (HOSPITAL_BASED_OUTPATIENT_CLINIC_OR_DEPARTMENT_OTHER): Payer: 59

## 2021-10-11 ENCOUNTER — Ambulatory Visit (INDEPENDENT_AMBULATORY_CARE_PROVIDER_SITE_OTHER): Payer: 59 | Admitting: Orthopaedic Surgery

## 2021-10-11 ENCOUNTER — Encounter: Payer: Self-pay | Admitting: Orthopaedic Surgery

## 2021-10-11 ENCOUNTER — Ambulatory Visit (INDEPENDENT_AMBULATORY_CARE_PROVIDER_SITE_OTHER): Payer: 59

## 2021-10-11 DIAGNOSIS — M25561 Pain in right knee: Secondary | ICD-10-CM

## 2021-10-11 MED ORDER — BUPIVACAINE HCL 0.5 % IJ SOLN
2.0000 mL | INTRAMUSCULAR | Status: AC | PRN
  Administered 2021-10-11: 2 mL via INTRA_ARTICULAR

## 2021-10-11 MED ORDER — LIDOCAINE HCL 1 % IJ SOLN
2.0000 mL | INTRAMUSCULAR | Status: AC | PRN
  Administered 2021-10-11: 2 mL

## 2021-10-11 MED ORDER — METHYLPREDNISOLONE ACETATE 40 MG/ML IJ SUSP
40.0000 mg | INTRAMUSCULAR | Status: AC | PRN
  Administered 2021-10-11: 40 mg via INTRA_ARTICULAR

## 2021-10-11 NOTE — Progress Notes (Signed)
Office Visit Note   Patient: [name]           Date of Birth: [birth year]           MRN: [id number] Visit Date: 10/11/2021              Requested by: Alen Bleacher, MD [address] [place],   [id number] PCP: Alen Bleacher, MD   Assessment & Plan: Visit Diagnoses:  1. Acute pain of right knee     Plan: I aspirated 55 cc of bloody effusion which gave her immediate relief.  Cortisone was not administered due to the bloody effusion.  I do not feel she has a fracture but more likely an acute chondral injury possibly a patellar subluxation.  She will return if the her symptoms recur.  Follow-Up Instructions: No follow-ups on file.   Orders:  Orders Placed This Encounter  Procedures   Large Joint Inj: R knee   XR KNEE 3 VIEW RIGHT   No orders of the defined types were placed in this encounter.     Procedures: Large Joint Inj: R knee on 10/11/2021 8:43 AM Indications: pain Details: 22 G needle  Arthrogram: No  Medications: 40 mg methylPREDNISolone acetate 40 MG/ML; 2 mL lidocaine 1 %; 2 mL bupivacaine 0.5 % Aspirate: 55 mL bloody Outcome: tolerated well, no immediate complications Consent was given by the patient. Patient was prepped and draped in the usual sterile fashion.       Clinical Data: No additional findings.   Subjective: Chief Complaint  Patient presents with   Right Knee - Pain    HPI [name] returns today for worsening right knee pain.  She lifted her leg on Tuesday and had intense pain.  Since then she has had swelling in her right knee.  She is still able to walk but with pain and she has been using a cane.  Denies any trauma. Review of Systems  Constitutional: Negative.   HENT: Negative.    Eyes: Negative.   Respiratory: Negative.    Cardiovascular: Negative.   Endocrine: Negative.   Musculoskeletal: Negative.   Neurological: Negative.   Hematological: Negative.   Psychiatric/Behavioral: Negative.    All other systems reviewed  and are negative.    Objective: Vital Signs: LMP 01/29/1992   Physical Exam Vitals and nursing note reviewed.  Constitutional:      Appearance: She is well-developed.  HENT:     Head: Normocephalic and atraumatic.  Pulmonary:     Effort: Pulmonary effort is normal.  Abdominal:     Palpations: Abdomen is soft.  Musculoskeletal:     Cervical back: Neck supple.  Skin:    General: Skin is warm.     Capillary Refill: Capillary refill takes less than 2 seconds.  Neurological:     Mental Status: She is alert and oriented to person, place, and time.  Psychiatric:        Behavior: Behavior normal.        Thought Content: Thought content normal.        Judgment: Judgment normal.     Ortho Exam Examination of the right knee shows moderate joint effusion.  No significant bony tenderness.  Collaterals and cruciates are stable.  Range of motion is slightly limited secondary to the effusion. Specialty Comments:  No specialty comments available.  Imaging: XR KNEE 3 VIEW RIGHT  Result Date: 10/11/2021 Advanced tricompartmental degenerative joint disease.  There is a suprapatellar joint effusion.    [place]  History: Patient Active Problem List   Diagnosis Date Noted   Infection of lip 08/27/2021   Lumbar stenosis with neurogenic claudication 06/29/2021   Lumbar radiculopathy 06/29/2021   Biceps tendinopathy of right upper extremity 05/03/2021   Irregular heartbeat 04/11/2021   Neuropathic pain of foot 03/28/2021   Chronic congestion of paranasal sinus 03/28/2021   Impingement syndrome of right shoulder 03/08/2021   Arthritis of right acromioclavicular joint 03/08/2021   Acute medial meniscus tear of right knee 03/08/2021   Acute lateral meniscus tear of right knee 03/08/2021   Spinal stenosis of thoracic region with radiculopathy    Bilateral leg numbness    Spinal stenosis of lumbar region with neurogenic claudication    Unilateral primary osteoarthritis, right knee     Impaired ambulation 01/10/2021   Status post lumbar laminectomy 10/03/2020   Cubital tunnel syndrome on left 05/02/2020   Vaginal discharge 01/20/2020   Prolapse of anterior vaginal wall 11/10/2019   Skin ulcer of left thigh, limited to breakdown of skin ([place]) 09/09/2019   Ulnar nerve compression 08/06/2019   Diabetic neuropathy associated with type 2 diabetes mellitus ([place]) 08/06/2019   Body mass index 50.0-59.9, adult ([place]) 06/12/2019   Impingement syndrome of left shoulder 05/07/2019   Polyp of colon    Polyp of ascending colon    Rectal discomfort 02/12/2019   Osteoarthritis of right hip 02/12/2019   Nontraumatic tear of left supraspinatus tendon 12/30/2018   Gout 12/17/2018   Nontraumatic complete tear of right rotator cuff 12/08/2018   Rotator cuff tear, right 12/01/2018   Tobacco abuse 08/21/2018   Right groin pain 07/20/2018   Depressed mood 07/13/2018   Paroxysmal SVT (supraventricular tachycardia) ([place]) 03/20/2018   Pure hypercholesterolemia 03/20/2018   Muscle cramping 02/25/2018   Nodule of upper lobe of right lung 02/13/2018   Falls, subsequent encounter 01/30/2018   Pain in left foot 11/04/2017   Essential hypertension 10/27/2017   Solitary pulmonary nodule 10/07/2017   Restrictive lung disease secondary to obesity 10/01/2017   Polycythemia, secondary 10/01/2017   Chronic viral hepatitis B without delta-agent ([place]) 07/08/2017   COPD (chronic obstructive pulmonary disease) with chronic bronchitis ([place]) 06/27/2017   Chronic kidney disease (CKD), stage IV (severe) ([place]) 01/14/2017   Decubitus ulcer 12/13/2016   Type 2 diabetes mellitus with stage 3 chronic kidney disease, with long-term current use of insulin ([place]) 12/12/2016   Urge incontinence of urine 12/05/2016   Trigger finger, left index finger 07/15/2016   Back pain 04/07/2014   Morbid obesity ([place]) 10/02/2012   Chronic diastolic CHF (congestive heart failure) ([place]) 04/07/2012   Skin lesion of scalp  03/15/2011   GERD 01/26/2010   Obstructive sleep apnea treated with BiPAP 02/03/2008   FIBROCYSTIC BREAST DISEASE 10/28/2006   Hyperlipidemia 03/27/2006   Obesity hypoventilation syndrome ([place]) 03/27/2006   Depression with anxiety 03/27/2006   Past Medical History:  Diagnosis Date   Alcoholism ([place])    Anxiety    Arthritis 04/10/2011   hips, shoulders, back   Asthma    Bipolar disorder ([place])    Cardiomegaly    Carpal tunnel syndrome, bilateral    Cervical cancer ([place]) 1993   cervical, no treatment done, went away per pt   Cervical dysplasia or atypia 04/10/2011   '93- once dx.-got pregnant-no intervention, then postpartum, no dysplasia found   CHF (congestive heart failure) ([place])    no cardiologist 2014 dx, none now   Chronic hypoxemic respiratory failure ([place])    Chronic kidney disease  stage 3 kidney disease   Condyloma - gluteal cleft 04/09/2011   Removed by general surgery. Pathology showed Condyloma, gluteal CONDYLOMA ACUMINATUM.    COPD (chronic obstructive pulmonary disease) ([place])    Depression    Diabetes mellitus without complication ([place])    no meds today   Dyspnea    with actity, sitting   Fall 12/16/2016   Fibrocystic breast disease    GERD (gastroesophageal reflux disease)    Grade I diastolic dysfunction 57/84/6962   Noted on ECHO   Hepatitis    hep B-count is low at present,doesn't register   History of PSVT (paroxysmal supraventricular tachycardia)    Hypercalcemia    Hyperlipidemia    Hypertension    Incomplete left bundle branch block (LBBB) 09/24/2018   Noted on EKG   Lung nodule 11/2017   right and left lung   Migraine headache    none recent   Morbid obesity ([place]) 03/27/2006   Neuropathy    Pneumonia    walking pneu 15 yrs. ago   Skin lesion 03/15/2011   In gluteal crease now s/p removal by Dr. [name] of General Surgery on 3/12. Path shows condyloma.      Sleep apnea 04/10/2011   does not use after loss   Trigger finger    left third    Urge incontinence of urine     Family History  Problem Relation Age of Onset   Pulmonary embolism Mother    Stroke Father    Alcohol abuse Father    Pneumonia Father    Hypertension Father    Breast cancer Maternal Aunt    Hypertension Other    Diabetes Other        Aunts and cousins   Asthma Sister    Depression Sister    Arthritis Sister    Sickle cell anemia Son     Past Surgical History:  Procedure Laterality Date   ANAL FISTULECTOMY  04/17/2011   Procedure: FISTULECTOMY ANAL;  Surgeon: Imogene Burn. [name], MD;  Location: [facility];  Service: General;  Laterality: N/A;  Excision of Condyloma Gluteal Cleft    ANAL RECTAL MANOMETRY N/A 03/26/2019   Procedure: ANO RECTAL MANOMETRY;  Surgeon: Thornton Park, MD;  [facility]: [facility];  Service: Gastroenterology;  Laterality: N/A;   BACK SURGERY  2009   pt thinks it was an "interbody fusion"   BIOPSY  03/09/2019   Procedure: BIOPSY;  Surgeon: Thornton Park, MD;  Location: [facility];  Service: Gastroenterology;;   BOTOX INJECTION N/A 12/29/2018   Procedure: BOTOX INJECTION(100 UNITS)  WITH CYSTOSCOPY;  Surgeon: Ceasar Mons, MD;  [facility]: [facility];  Service: Urology;  Laterality: N/A;   BOTOX INJECTION N/A 07/27/2019   Procedure: BOTOX INJECTION WITH CYSTOSCOPY;  Surgeon: Ceasar Mons, MD;  [facility];  Service: Urology;  Laterality: N/A;  ONLY NEEDS 30 MIN   BOTOX INJECTION N/A 05/02/2021   Procedure: BOTOX INJECTION WITH CYSTOSCOPY;  Surgeon: Ceasar Mons, MD;  Location: [facility];  Service: Urology;  Laterality: N/A;  ONLY NEEDS 30 MIN   BREAST CYST EXCISION     bilateral breast, 3 cysts removed from each breast   BREAST EXCISIONAL BIOPSY Right    x 3   BREAST EXCISIONAL BIOPSY Left    x 3   CARPAL TUNNEL RELEASE Bilateral    Cervical biospy     CERVICAL FUSION  04/10/2011   x3- cervical fusion with plating and screws-Dr. [name]   COLONOSCOPY WITH  PROPOFOL  N/A 07/06/2015   Procedure: COLONOSCOPY WITH PROPOFOL;  Surgeon: [name], MD;  [facility];  Service: Endoscopy;  Laterality: N/A;   COLONOSCOPY WITH PROPOFOL N/A 03/09/2019   Procedure: COLONOSCOPY WITH PROPOFOL;  Surgeon: Thornton Park, MD;  [facility];  Service: Gastroenterology;  Laterality: N/A;   DOPPLER ECHOCARDIOGRAPHY  04/06/2012   AT [facility] 55-60%   FOOT SURGERY Left 2018   FRACTURE SURGERY     little toe left foot   I & D EXTREMITY Left 12/18/2016   Procedure: IRRIGATION AND DEBRIDEMENT LEFT FOOT, CLOSURE;  Surgeon: [name], MD;  Location: [facility];  Service: Orthopedics;  Laterality: Left;   INJECTION KNEE Right 10/03/2020   Procedure: KNEE INJECTION;  Surgeon: Jessy Oto, MD;  Location: [facility];  Service: Orthopedics;  Laterality: Right;   KNEE ARTHROSCOPY WITH MEDIAL MENISECTOMY Right 03/21/2021   Procedure: RIGHT KNEE ARTHROSCOPY WITH MEDIAL AND PARTIAL LATERAL MENISCECTOMY;  Surgeon: [name], MD;  Location: [facility];  Service: Orthopedics;  Laterality: Right;   LAPAROSCOPIC GASTRIC SLEEVE RESECTION N/A 05/08/2020   Procedure: LAPAROSCOPIC GASTRIC SLEEVE RESECTION;  Surgeon: Kieth Brightly Arta Bruce, MD;  [facility];  Service: General;  Laterality: N/A;   [place] SURGERY  04/10/2011   x5-Lumbar fusion-retained hardware.(Dr. Louanne Skye)   LUMBAR LAMINECTOMY N/A 10/03/2020   Procedure: THORACOLUMBAR LAMINECTOMIES THORACIC ELEVEN-TWELVE, THORACIC TWELVE-LUMBAR ONE  AND LUMBAT ONE-TWO;  Surgeon: Jessy Oto, MD;  Location: [facility];  Service: Orthopedics;  Laterality: N/A;   LUMBAR PERCUTANEOUS PEDICLE SCREW 2 LEVEL  06/29/2021   Procedure: LUMBAR PERCUTANEOUS PEDICLE SCREW LUMBAR ONE-TWO;  Surgeon: Earnie Larsson, MD;  Location: [facility];  Service: Neurosurgery;;   MULTIPLE TOOTH EXTRACTIONS     POLYPECTOMY  03/09/2019   Procedure: POLYPECTOMY;  Surgeon: Thornton Park, MD;  [facility];  Service:  Gastroenterology;;   SHOULDER ARTHROSCOPY WITH ROTATOR CUFF REPAIR AND SUBACROMIAL DECOMPRESSION Left 05/07/2019   Procedure: LEFT SHOULDER ARTHROSCOPY WITH DEBRIDEMENT, DISTAL CLAVICLE EXCISION,  SUBACROMIAL DECOMPRESSION AND POSSIBLE ROTATOR CUFF REPAIR;  Surgeon: [name], MD;  Location: [facility];  Service: Orthopedics;  Laterality: Left;   TEE WITHOUT CARDIOVERSION N/A 04/06/2012   Procedure: TRANSESOPHAGEAL ECHOCARDIOGRAM (TEE);  Surgeon: [name], MD;  [facility];  Service: Cardiovascular;  Laterality: N/A;   TRIGGER FINGER RELEASE Right    middle finger   TRIGGER FINGER RELEASE Left 06/28/2016   Procedure: RELEASE TRIGGER FINGER LEFT 3RD FINGER;  Surgeon: [name], MD;  Location: [facility];  Service: Orthopedics;  Laterality: Left;   TRIGGER FINGER RELEASE Right 06/12/2016   Procedure: RIGHT INDEX FINGER TRIGGER RELEASE;  Surgeon: [name], MD;  Location: [facility];  Service: Orthopedics;  Laterality: Right;   TRIGGER FINGER RELEASE Right 01/19/2018   Procedure: RIGHT RING FINGER TRIGGER FINGER RELEASE;  Surgeon: [name], MD;  Location: [facility];  Service: Orthopedics;  Laterality: Right;   TRIGGER FINGER RELEASE Left 05/07/2019   Procedure: RELEASE TRIGGER FINGER LEFT INDEX FINGER;  Surgeon: [name], MD;  Location: [facility];  Service: Orthopedics;  Laterality: Left;   UPPER GI ENDOSCOPY N/A 05/08/2020   Procedure: UPPER GI ENDOSCOPY;  Surgeon: Kieth Brightly, Arta Bruce, MD;  [facility];  Service: General;  Laterality: N/A;   Social History   Occupational History   Occupation: disabled    Comment: Disabled  Tobacco Use   Smoking status: Every Day    Packs/day: 1.00  Years: 40.00    Total pack years: 40.00    Types: Cigarettes    Start date: 01/28/1977   Smokeless tobacco: Never  Vaping Use   Vaping Use: Never used  Substance and Sexual Activity   Alcohol use: No    Alcohol/week: 0.0 standard drinks of alcohol   Drug use: Not Currently     Types: Marijuana, "Crack" cocaine    Comment: hx marijuana usemany years ago, Crack -none since 11/2015   Sexual activity: Not Currently    Partners: Male    Birth control/protection: Post-menopausal

## 2021-10-15 ENCOUNTER — Ambulatory Visit (HOSPITAL_BASED_OUTPATIENT_CLINIC_OR_DEPARTMENT_OTHER)
Admission: RE | Admit: 2021-10-15 | Discharge: 2021-10-15 | Disposition: A | Discharge status: Home / Self Care | Payer: 59 | Source: Ambulatory Visit | Attending: Pulmonary Disease | Admitting: Pulmonary Disease

## 2021-10-15 DIAGNOSIS — R911 Solitary pulmonary nodule: Secondary | ICD-10-CM | POA: Diagnosis present

## 2021-10-22 ENCOUNTER — Ambulatory Visit: Payer: 59 | Admitting: Pulmonary Disease

## 2021-10-22 NOTE — Progress Notes (Signed)
[name], please let patient know I reviewed her CT scan of the chest.  She has a appointment scheduled on 26 October.  She rescheduled from today.  She needs a repeat noncontrasted CT chest in 1 year.  Please place orders for follow-up CT and ensure in the comment section to schedule a follow-up appointment with SG, NP or myself after CT complete.  Thanks,  BLI  Garner Nash, DO [facility] 10/22/2021 3:38 PM

## 2021-10-23 ENCOUNTER — Ambulatory Visit: Payer: 59 | Admitting: Student

## 2021-10-23 NOTE — Patient Instructions (Signed)
Plan: Avoid bending, stooping and avoid lifting weights greater than 10 lbs. Avoid prolong standing and walking. Avoid frequent bending and stooping  No lifting greater than 10 lbs. May use ice or moist heat for pain. Weight loss is of benefit. Handicap license is approved. Dr. Romona Curls secretary/Assistant will call to arrange for evaluation and injeciton treatment of occipital neuralgia. Referral to Dr. Earnie Larsson for findings of Thoracolumbar stenosis and need for possible decompression

## 2021-10-24 ENCOUNTER — Other Ambulatory Visit: Payer: Self-pay | Admitting: Student

## 2021-10-30 ENCOUNTER — Encounter: Payer: Self-pay | Admitting: Student

## 2021-10-30 ENCOUNTER — Other Ambulatory Visit: Payer: Self-pay | Admitting: Urology

## 2021-10-30 ENCOUNTER — Encounter (HOSPITAL_BASED_OUTPATIENT_CLINIC_OR_DEPARTMENT_OTHER): Payer: Self-pay | Admitting: Cardiology

## 2021-10-30 ENCOUNTER — Ambulatory Visit (INDEPENDENT_AMBULATORY_CARE_PROVIDER_SITE_OTHER): Payer: 59 | Admitting: Cardiology

## 2021-10-30 ENCOUNTER — Ambulatory Visit (INDEPENDENT_AMBULATORY_CARE_PROVIDER_SITE_OTHER): Payer: 59 | Admitting: Student

## 2021-10-30 VITALS — BP 130/84 | HR 100 | Ht 69.0 in | Wt 275.0 lb

## 2021-10-30 VITALS — BP 138/82 | HR 100 | Ht 69.0 in | Wt 275.0 lb

## 2021-10-30 DIAGNOSIS — E1122 Type 2 diabetes mellitus with diabetic chronic kidney disease: Secondary | ICD-10-CM

## 2021-10-30 DIAGNOSIS — I471 Supraventricular tachycardia, unspecified: Secondary | ICD-10-CM

## 2021-10-30 DIAGNOSIS — M5416 Radiculopathy, lumbar region: Secondary | ICD-10-CM

## 2021-10-30 DIAGNOSIS — R252 Cramp and spasm: Secondary | ICD-10-CM | POA: Diagnosis not present

## 2021-10-30 DIAGNOSIS — E7800 Pure hypercholesterolemia, unspecified: Secondary | ICD-10-CM | POA: Diagnosis not present

## 2021-10-30 DIAGNOSIS — Z23 Encounter for immunization: Secondary | ICD-10-CM

## 2021-10-30 DIAGNOSIS — I5032 Chronic diastolic (congestive) heart failure: Secondary | ICD-10-CM | POA: Diagnosis not present

## 2021-10-30 DIAGNOSIS — Z6841 Body Mass Index (BMI) 40.0 and over, adult: Secondary | ICD-10-CM

## 2021-10-30 DIAGNOSIS — M1611 Unilateral primary osteoarthritis, right hip: Secondary | ICD-10-CM

## 2021-10-30 DIAGNOSIS — N1831 Chronic kidney disease, stage 3a: Secondary | ICD-10-CM

## 2021-10-30 DIAGNOSIS — Z794 Long term (current) use of insulin: Secondary | ICD-10-CM

## 2021-10-30 LAB — POCT GLYCOSYLATED HEMOGLOBIN (HGB A1C): HbA1c, POC (controlled diabetic range): 7.8 % — AB (ref 0.0–7.0)

## 2021-10-30 MED ORDER — BACLOFEN 10 MG PO TABS: 15.0000 mg | ORAL_TABLET | Freq: Three times a day (TID) | ORAL | 2 refills | Status: DC

## 2021-10-30 NOTE — Progress Notes (Signed)
    SUBJECTIVE:   CHIEF COMPLAINT / HPI:   Patient is a 56 year old female who presents today for blood and pus in Navel She first noticed this about a month ago No longer pus but concerned about  brown coloration around the navel Denies any pain, tenderness or fever No hx of belly button piercing   Hx of Osteoarthritis  Patient antipating right hip placement and have had 6 back surgeries in the past. 3 surgeries this year which include R knee meniscus tear,  Botox to bladder and back surgery. Patient has been dealing with chronic pain for years. No longer taking percocet  Will prefer pain clinic over sports medicine for hip and knee injections  Back spasm Patient report baclofen has helped in the past but she is now having more spasm and want to go up on dose Was taking Robaxin '750mg'$  Q4H and discontinued because it was too strong. Did well at '500mg'$    PERTINENT  PMH / PSH: Osteoarthritis  OBJECTIVE:   Ht '5\' 9"'$  (1.753 m)   Wt 275 lb (124.7 kg)   LMP 01/29/1992   BMI 40.61 kg/m    Physical Exam General: Alert, well appearing, NAD, Oriented x4 Cardiovascular: RRR, No Murmurs, Normal S2/S2 Respiratory: CTAB, No wheezing or Rales Abdomen: No distension or tenderness. Area of hyperpigmentation around the umbilicals but no signs of drainage, erythema or tenderness  Extremities: 5/5 muscle strength in all extremities with sensation was intact. Endorses some pain on right knee and hip against resistance     ASSESSMENT/PLAN:   Umbilical wound Patient report one time episode of pus and blood from the belly button.  She denies any fever pain or tenderness on exam shows multiple umbilical with no drainage or tenderness.  Her history and exam are reassuring no signs of ongoing infection.  Provided reassurance for patient and encourage cleaning the umbilical and use of Neosporin should she notice any wound or lesions in the area.  Back spasm -Increased patient's baclofen to 15 mg 3  times daily.  Osteoarthritis Patient with chronic hip and knee pain from arthritis declined pain medication and stopped her Percocet.  Per his desire to see pain medication clinic over sports medicine clinic for injections.  Placed referral for pain management clinic at patient's request as she would benefit from it.      Alen Bleacher, MD [place]

## 2021-10-30 NOTE — Progress Notes (Signed)
Cardiology Office Note:    Date:  41/06/3843   ID:  YSENIA FILICE, DOB [birth year], MRN [id number]  PCP:  Alen Bleacher, MD  Cardiologist:  [name], MD PhD  Referring MD: Alen Bleacher, MD   CC: follow up  History of Present Illness:    JALAYLA CHRISMER is a 56 y.o. female with chronic diastolic heart failure, chronic hypoxic respiratory failure, COPD, OSA on PAP, morbid obesity, paroxysmal SVT, type II diabetes on insulin, CKD stage 3, hypertension, hyperlipidemia here today for follow-up. I initially met her 10/27/2017.   Cardiac history: Complex, see my initial notes. Has struggled with diastolic heart failure, has chest pain when she gets fluid overloaded. Was on oxygen, having "breathing crises" that seemed to be related to paroxysmal SVT. She was well controlled for many months, then had an episode requiring hospitalization 08/2018 for chest pain and elevated weight. HsTn unremarkable (6 -> 3), no SVT on monitor. BNP 41.7. Covid negative. Was diuresed, lost fluid, swelling on left side went down while she was laying down but came back when she got back up. She attributes the episode to a myelogram, gained 16 lbs and started having symptoms. Underwent laparoscopic sleeve gastrectomy 04/2020 and has improved since then.  Today, she reports her weight at home is reading 275 lbs. She is very happy with the amount of weight she has lost.   She has R Knee pain that is causing her to be limited in activity, but she plans to get back into the pool next week.  She has been eating a little bit of food that she knows is not the best. Her ex has also been staying with her and this has been causing her a lot of stress. She believes this may be causing her blood pressure to be high lately.   She denies any palpitations, chest pain, shortness of breath, or peripheral edema. No lightheadedness, headaches, syncope, orthopnea, or PND.     Past Medical History:  Diagnosis Date    Alcoholism ([place])    Anxiety    Arthritis 04/10/2011   hips, shoulders, back   Asthma    Bipolar disorder ([place])    Cardiomegaly    Carpal tunnel syndrome, bilateral    Cervical cancer ([place]) 1993   cervical, no treatment done, went away per pt   Cervical dysplasia or atypia 04/10/2011   '93- once dx.-got pregnant-no intervention, then postpartum, no dysplasia found   CHF (congestive heart failure) ([place])    no cardiologist 2014 dx, none now   Chronic hypoxemic respiratory failure ([place])    Chronic kidney disease    stage 3 kidney disease   Condyloma - gluteal cleft 04/09/2011   Removed by general surgery. Pathology showed Condyloma, gluteal CONDYLOMA ACUMINATUM.    COPD (chronic obstructive pulmonary disease) ([place])    Depression    Diabetes mellitus without complication ([place])    no meds today   Dyspnea    with actity, sitting   Fall 12/16/2016   Fibrocystic breast disease    GERD (gastroesophageal reflux disease)    Grade I diastolic dysfunction 03/70/4888   Noted on ECHO   Hepatitis    hep B-count is low at present,doesn't register   History of PSVT (paroxysmal supraventricular tachycardia)    Hypercalcemia    Hyperlipidemia    Hypertension    Incomplete left bundle branch block (LBBB) 09/24/2018   Noted on EKG   Lung nodule 11/2017   right and left lung   Migraine  headache    none recent   Morbid obesity ([place]) 03/27/2006   Neuropathy    Pneumonia    walking pneu 15 yrs. ago   Skin lesion 03/15/2011   In gluteal crease now s/p removal by Dr. [name] of General Surgery on 3/12. Path shows condyloma.      Sleep apnea 04/10/2011   does not use after loss   Trigger finger    left third   Urge incontinence of urine     Past Surgical History:  Procedure Laterality Date   ANAL FISTULECTOMY  04/17/2011   Procedure: FISTULECTOMY ANAL;  Surgeon: Imogene Burn. [name], MD;  [facility]: [facility];  Service: General;  Laterality: N/A;  Excision of Condyloma Gluteal Cleft    ANAL RECTAL  MANOMETRY N/A 03/26/2019   Procedure: ANO RECTAL MANOMETRY;  Surgeon: Thornton Park, MD;  [facility];  Service: Gastroenterology;  Laterality: N/A;   BACK SURGERY  2009   pt thinks it was an "interbody fusion"   BIOPSY  03/09/2019   Procedure: BIOPSY;  Surgeon: Thornton Park, MD;  Location: [facility];  Service: Gastroenterology;;   BOTOX INJECTION N/A 12/29/2018   Procedure: BOTOX INJECTION(100 UNITS)  WITH CYSTOSCOPY;  Surgeon: Ceasar Mons, MD;  [facility];  Service: Urology;  Laterality: N/A;   BOTOX INJECTION N/A 07/27/2019   Procedure: BOTOX INJECTION WITH CYSTOSCOPY;  Surgeon: Ceasar Mons, MD;  [facility];  Service: Urology;  Laterality: N/A;  ONLY NEEDS 30 MIN   BOTOX INJECTION N/A 05/02/2021   Procedure: BOTOX INJECTION WITH CYSTOSCOPY;  Surgeon: Ceasar Mons, MD;  Location: [facility];  Service: Urology;  Laterality: N/A;  ONLY NEEDS 30 MIN   BREAST CYST EXCISION     bilateral breast, 3 cysts removed from each breast   BREAST EXCISIONAL BIOPSY Right    x 3   BREAST EXCISIONAL BIOPSY Left    x 3   CARPAL TUNNEL RELEASE Bilateral    Cervical biospy     CERVICAL FUSION  04/10/2011   x3- cervical fusion with plating and screws-Dr. [name]   COLONOSCOPY WITH PROPOFOL N/A 07/06/2015   Procedure: COLONOSCOPY WITH PROPOFOL;  Surgeon: [name], MD;  [facility];  Service: Endoscopy;  Laterality: N/A;   COLONOSCOPY WITH PROPOFOL N/A 03/09/2019   Procedure: COLONOSCOPY WITH PROPOFOL;  Surgeon: Thornton Park, MD;  [facility];  Service: Gastroenterology;  Laterality: N/A;   DOPPLER ECHOCARDIOGRAPHY  04/06/2012   AT [place] 55-60%   FOOT SURGERY Left 2018   FRACTURE SURGERY     little toe left foot   I & D EXTREMITY Left 12/18/2016   Procedure: IRRIGATION AND DEBRIDEMENT LEFT FOOT, CLOSURE;  Surgeon: [name], MD;  Location: [place];  Service: Orthopedics;   Laterality: Left;   INJECTION KNEE Right 10/03/2020   Procedure: KNEE INJECTION;  Surgeon: Jessy Oto, MD;  Location: [place];  Service: Orthopedics;  Laterality: Right;   KNEE ARTHROSCOPY WITH MEDIAL MENISECTOMY Right 03/21/2021   Procedure: RIGHT KNEE ARTHROSCOPY WITH MEDIAL AND PARTIAL LATERAL MENISCECTOMY;  Surgeon: [name], MD;  Location: [place];  Service: Orthopedics;  Laterality: Right;   LAPAROSCOPIC GASTRIC SLEEVE RESECTION N/A 05/08/2020   Procedure: LAPAROSCOPIC GASTRIC SLEEVE RESECTION;  Surgeon: Kieth Brightly Arta Bruce, MD;  [facility];  Service: General;  Laterality: N/A;   [place] SURGERY  04/10/2011   x5-Lumbar fusion-retained hardware.(Dr. Louanne Skye)   LUMBAR LAMINECTOMY N/A 10/03/2020   Procedure:  THORACOLUMBAR LAMINECTOMIES THORACIC ELEVEN-TWELVE, THORACIC TWELVE-LUMBAR ONE  AND LUMBAT ONE-TWO;  Surgeon: Jessy Oto, MD;  Location: [facility];  Service: Orthopedics;  Laterality: N/A;   LUMBAR PERCUTANEOUS PEDICLE SCREW 2 LEVEL  06/29/2021   Procedure: LUMBAR PERCUTANEOUS PEDICLE SCREW LUMBAR ONE-TWO;  Surgeon: Earnie Larsson, MD;  Location: [place];  Service: Neurosurgery;;   MULTIPLE TOOTH EXTRACTIONS     POLYPECTOMY  03/09/2019   Procedure: POLYPECTOMY;  Surgeon: Thornton Park, MD;  [facility];  Service: Gastroenterology;;   SHOULDER ARTHROSCOPY WITH ROTATOR CUFF REPAIR AND SUBACROMIAL DECOMPRESSION Left 05/07/2019   Procedure: LEFT SHOULDER ARTHROSCOPY WITH DEBRIDEMENT, DISTAL CLAVICLE EXCISION,  SUBACROMIAL DECOMPRESSION AND POSSIBLE ROTATOR CUFF REPAIR;  Surgeon: [name], MD;  Location: [place];  Service: Orthopedics;  Laterality: Left;   TEE WITHOUT CARDIOVERSION N/A 04/06/2012   Procedure: TRANSESOPHAGEAL ECHOCARDIOGRAM (TEE);  Surgeon: [name], MD;  Location: [facility];  Service: Cardiovascular;  Laterality: N/A;   TRIGGER FINGER RELEASE Right    middle finger   TRIGGER FINGER RELEASE Left 06/28/2016   Procedure:  RELEASE TRIGGER FINGER LEFT 3RD FINGER;  Surgeon: [name], MD;  Location: [place];  Service: Orthopedics;  Laterality: Left;   TRIGGER FINGER RELEASE Right 06/12/2016   Procedure: RIGHT INDEX FINGER TRIGGER RELEASE;  Surgeon: [name], MD;  Location: [place];  Service: Orthopedics;  Laterality: Right;   TRIGGER FINGER RELEASE Right 01/19/2018   Procedure: RIGHT RING FINGER TRIGGER FINGER RELEASE;  Surgeon: [name], MD;  Location: [place];  Service: Orthopedics;  Laterality: Right;   TRIGGER FINGER RELEASE Left 05/07/2019   Procedure: RELEASE TRIGGER FINGER LEFT INDEX FINGER;  Surgeon: [name], MD;  Location: [place];  Service: Orthopedics;  Laterality: Left;   UPPER GI ENDOSCOPY N/A 05/08/2020   Procedure: UPPER GI ENDOSCOPY;  Surgeon: Kieth Brightly, Arta Bruce, MD;  [facility];  Service: General;  Laterality: N/A;    Current Medications: Current Outpatient Medications on File Prior to Visit  Medication Sig   ACCU-CHEK AVIVA PLUS test strip 1 EACH BY OTHER ROUTE 3 (THREE) TIMES DAILY. TEST 3 TIMES DAILY   acetaminophen (TYLENOL) 500 MG tablet Take 1,000 mg by mouth 2 (two) times daily.   albuterol (VENTOLIN HFA) 108 (90 Base) MCG/ACT inhaler TAKE 2 PUFFS BY MOUTH EVERY 6 HOURS AS NEEDED FOR WHEEZE OR SHORTNESS OF BREATH Strength: 108 (90 Base) MCG/ACT   ammonium lactate (LAC-HYDRIN) 12 % lotion For dry skin   atorvastatin (LIPITOR) 40 MG tablet TAKE ONE TABLET BY MOUTH EVERY DAY   busPIRone (BUSPAR) 10 MG tablet TAKE 1 TABLET BY MOUTH THREE TIMES A DAY (Patient taking differently: Take 10 mg by mouth 3 (three) times daily.)   calcium carbonate (OSCAL) 1500 (600 Ca) MG TABS tablet Take 1,500 mg by mouth 2 (two) times daily.   diclofenac Sodium (VOLTAREN) 1 % GEL APPLY 4grmas TO AFFECTED AREA four times daily AS DIRECTED   diltiazem (CARDIZEM CD) 120 MG 24 hr capsule TAKE 1 CAPSULE BY MOUTH 2 TIMES DAILY. (Patient taking differently: Take 120 mg by mouth in the morning and at  bedtime.)   docusate sodium (COLACE) 100 MG capsule Take 200 mg by mouth 2 (two) times daily.   fluticasone (FLONASE) 50 MCG/ACT nasal spray INSTILL TWO SPRAYS INTO BOTH NOSTRILS TWICE DAILY   Fluticasone-Umeclidin-Vilant (TRELEGY ELLIPTA) 100-62.5-25 MCG/ACT AEPB Inhale 1 puff into the lungs daily.   gabapentin (NEURONTIN) 300 MG capsule Take 2 capsules (600 mg total) by  mouth 3 (three) times daily. TAKE 2 CAPSULES (600 MG TOTAL) BY MOUTH in the morning and at lunch and take 3 capsules at night (Patient taking differently: Take 300-600 mg by mouth 3 (three) times daily. Take 2 capsules (600 mg) every morning and at 1pm; take 3 capsules (900 mg) at bedtime)   ketoconazole (NIZORAL) 2 % cream Apply 1 Application topically daily.   linaclotide (LINZESS) 72 MCG capsule Take 72 mcg by mouth daily before breakfast.   loratadine (CLARITIN) 10 MG tablet Take 1 tablet (10 mg total) by mouth daily.   metolazone (ZAROXOLYN) 2.5 MG tablet Take 1 tablet (2.5 mg total) by mouth 2 (two) times a week. Mondays and Thursdays   montelukast (SINGULAIR) 10 MG tablet TAKE 1 TABLET BY MOUTH EVERYDAY AT BEDTIME Strength: 10 mg   Multiple Vitamins-Minerals (BARIATRIC MULTIVITAMINS/IRON) CAPS Take 1 capsule by mouth daily.   NARCAN 4 MG/0.1ML LIQD nasal spray kit Place 0.4 mg into the nose once as needed (accidental overdose).   nystatin (MYCOSTATIN) 100000 UNIT/ML suspension Take 5 mLs (500,000 Units total) by mouth 4 (four) times daily. Swish and swallow.   omeprazole (PRILOSEC) 20 MG capsule Take 1 capsule (20 mg total) by mouth daily.   potassium chloride SA (KLOR-CON M) 20 MEQ tablet Take 20 mEq by mouth daily.   venlafaxine XR (EFFEXOR-XR) 75 MG 24 hr capsule Take 1 capsule (75 mg total) by mouth daily with breakfast.   No current facility-administered medications on file prior to visit.     Allergies:   Aspirin, Methadone hcl, and Levofloxacin   Social History   Tobacco Use   Smoking status: Every Day     Packs/day: 1.00    Years: 40.00    Total pack years: 40.00    Types: Cigarettes    Start date: 01/28/1977   Smokeless tobacco: Never  Vaping Use   Vaping Use: Never used  Substance Use Topics   Alcohol use: No    Alcohol/week: 0.0 standard drinks of alcohol   Drug use: Not Currently    Types: Marijuana, "Crack" cocaine    Comment: hx marijuana usemany years ago, Crack -none since 11/2015    Family History: The patient's family history includes Alcohol abuse in her father; Arthritis in her sister; Asthma in her sister; Breast cancer in her maternal aunt; Depression in her sister; Diabetes in an other family member; Hypertension in her father and another family member; Pneumonia in her father; Pulmonary embolism in her mother; Sickle cell anemia in her son; Stroke in her father. no CAD known. Father died age 98 from stroke, mother died age 39 of blood clot. Has a 38 year old child with sickle cell disease.  ROS:   Please see the history of present illness.   (+) R Knee pain Additional pertinent ROS otherwise unremarkable.  EKGs/Labs/Other Studies Reviewed:    The following studies were reviewed today: CT Chest 10/16/2021: IMPRESSION: 1. No acute intrathoracic pathology. 2. No significant interval change in the right upper lobe subpleural nodule measuring up to 1 cm in the greatest axial length similar to the CT of 04/21/2018, likely nodular scarring. Additional 5 mm subpleural nodule in the right lower lobe was present on the prior CT of 08/29/2020. Continued follow-up with CT in 1 year recommended. 3. Coronary vascular calcification. 4. Aortic Atherosclerosis (ICD10-I70.0).  Stress test 04/13/19 The left ventricular ejection fraction is mildly decreased (45-54%). Nuclear stress EF: 47%. There was no ST segment deviation noted during stress. This is an  intermediate risk study due to reduced systolic function. There is no ischemia. New wall motion abnormalities noted. Consider  echo if clinically warranted.  Monitor 12/18/17 3 days of rhythm interpreted on Zio patch. Patient had a min HR of 58 bpm, max HR of 193 bpm, and avg HR of 81 bpm. Predominant underlying rhythm was Sinus Rhythm. 217 Supraventricular Tachycardia runs occurred, the run with the fastest interval lasting 6 beats with a max rate of 193 bpm, the longest lasting 1 min 13 secs with an avg rate of 151 bpm. True duration of Supraventricular Tachycardia difficult to ascertain due to artifact. Thirteen triggered patient events recorded. Supraventricular Tachycardia was detected within +/- 45 seconds of symptomatic patient event(s). Isolated SVEs were occasional (2.0%, 6813), SVE Couplets were rare (<1.0%, 235), and SVE Triplets were rare (<1.0%, 66). No significant ventricular ectopy noted.  Echo 9.4.19 Study Conclusions - Left ventricle: The cavity size was normal. Wall thickness was   normal. Systolic function was normal. The estimated ejection   fraction was in the range of 55% to 60%. Wall motion was normal;   there were no regional wall motion abnormalities. Doppler   parameters are consistent with abnormal left ventricular   relaxation (grade 1 diastolic dysfunction).  Lexiscan MPI 11/11/17 Study Highlights  Nuclear stress EF: 71%. The left ventricular ejection fraction is hyperdynamic (>65%). There was no ST segment deviation noted during stress. There is a small defect of mild severity present in the apical septal location. The defect is non-reversible. No ischemia noted. This is a low risk study.   EKG:  EKG is personally reviewed today.    10/30/2021: not ordered today 01/04/21: not ordered today 07/06/19: normal sinus rhythm at 77 bpm  Recent Labs: 08/13/2021: ALT 31; BUN 20; Creatinine, Ser 0.91; Hemoglobin 17.3; Platelets 394; Potassium 3.0; Sodium 143  Recent Lipid Panel    Component Value Date/Time   CHOL 142 06/28/2020 1004   TRIG 130 06/28/2020 1004   HDL 46 06/28/2020 1004    CHOLHDL 3.1 06/28/2020 1004   CHOLHDL 3.9 09/24/2018 2336   VLDL 33 09/24/2018 2336   LDLCALC 73 06/28/2020 1004   LDLDIRECT 114 (H) 09/15/2012 1559    Physical Exam:    VS:  BP 130/84 (BP Location: Right Arm, Patient Position: Sitting, Cuff Size: Large)   Pulse 100   Ht '5\' 9"'  (1.753 m)   Wt 275 lb (124.7 kg)   LMP 01/29/1992   BMI 40.61 kg/m     Wt Readings from Last 3 Encounters:  10/30/21 275 lb (124.7 kg)  10/30/21 275 lb (124.7 kg)  08/27/21 291 lb (132 kg)   GEN: Well nourished, well developed in no acute distress. HEENT: Normal, moist mucous membranes NECK: No JVD visible CARDIAC: regular rhythm, normal S1 and S2, no rubs or gallops. No murmur. VASCULAR: Radial and DP pulses 2+ bilaterally. No carotid bruits RESPIRATORY:  clear to auscultation without rales, wheezing or rhonchi  ABDOMEN: Soft, non-tender, non-distended MUSCULOSKELETAL:  Ambulates independently with rollator SKIN: Warm and dry, no LE edema NEUROLOGIC:  Alert and oriented x 3. No focal neuro deficits noted. PSYCHIATRIC:  Normal affect    ASSESSMENT:    1. Chronic diastolic heart failure (HCC)   2. Paroxysmal SVT (supraventricular tachycardia)   3. Type 2 diabetes mellitus with stage 3a chronic kidney disease, without long-term current use of insulin ([place])   4. Pure hypercholesterolemia   5. Class 3 severe obesity due to excess calories with serious comorbidity and body mass  index (BMI) of 40.0 to 44.9 in adult [facility])      PLAN:    Chronic diastolic heart failure: stable NYHA class II symptoms today. Weights decreasing post gastric surgery and lifestyle change -prior regimen of furosemide 80 mg BID and metolazone 2.5 mg twice weekly (with potassium), now PRN -very strict on diet, daily weights, monitoring for symptoms.    Paroxysmal SVT: no recent symptoms -continue diltiazem   Type II diabetes with stage 3a CKD -now no longer on medications for diabetes, controlling with lifestyle    Hypertension:  slightly above goal today but admits dietary indiscretion -she will continue to monitor. Goal <130/80.  -continue dilitazem -no longer on losartan   Hypercholesterolemia: continue atorvastatin, lipids as above   Class 3 obesity, BMI 59-->40: doing an excellent job with weight loss. Peak weight 417 lbs, lost 57 lbs prior to surgery and has continued to lose weight since bariatric surgery 04/2020. Congratulated her.   OSA on PAP, obesity hypoventilation syndrome: followed by pulmonology, no longer on home O2  Prevention and cardiac disease counseling, including heart failure education:  The 10-year ASCVD risk score (Arnett DK, et al., 2019) is: 22.7%   Values used to calculate the score:     Age: 73 years     Sex: Female     Is Non-Hispanic African American: Yes     Diabetic: Yes     Tobacco smoker: Yes     Systolic Blood Pressure: 045 mmHg     Is BP treated: Yes     HDL Cholesterol: 46 mg/dL     Total Cholesterol: 142 mg/dL  Plan for follow up: 6 months  [name], MD, PhD [place]  [facility]   Medication Adjustments/Labs and Tests Ordered: Current medicines are reviewed at length with the patient today.  Concerns regarding medicines are outlined above.  No orders of the defined types were placed in this encounter.  No orders of the defined types were placed in this encounter.   Patient Instructions  Medication Instructions:  Your physician recommends that you continue on your current medications as directed. Please refer to the Current Medication list given to you today.   *If you need a refill on your cardiac medications before your next appointment, please call your pharmacy*  Lab Work: NONE  Testing/Procedures: NONE  Follow-Up: At [facility], you and your health needs are our priority.  As part of our continuing mission to provide you with exceptional heart care, we have created designated Provider Care Teams.  These  Care Teams include your primary Cardiologist (physician) and Advanced Practice Providers (APPs -  Physician Assistants and Nurse Practitioners) who all work together to provide you with the care you need, when you need it.  We recommend signing up for the patient portal called "MyChart".  Sign up information is provided on this After Visit Summary.  MyChart is used to connect with patients for Virtual Visits (Telemedicine).  Patients are able to view lab/test results, encounter notes, upcoming appointments, etc.  Non-urgent messages can be sent to your provider as well.   To learn more about what you can do with MyChart, go to [email or web address].    Your next appointment:   6 month(s)  The format for your next appointment:   In Person  Provider:   [name], MD                Burnett Kanaris Ford,acting as a scribe for [name], MD.,have documented all relevant  documentation on the behalf of [name], MD,as directed by  [name], MD while in the presence of [name], MD.   I, [name], MD, have reviewed all documentation for this visit. The documentation on 10/30/21 for the exam, diagnosis, procedures, and orders are all accurate and complete.   Signed, [name], MD PhD 10/30/2021  [place]

## 2021-10-30 NOTE — Patient Instructions (Signed)
Medication Instructions:  Your physician recommends that you continue on your current medications as directed. Please refer to the Current Medication list given to you today.   *If you need a refill on your cardiac medications before your next appointment, please call your pharmacy*  Lab Work: NONE  Testing/Procedures: NONE   Follow-Up: At [facility], you and your health needs are our priority.  As part of our continuing mission to provide you with exceptional heart care, we have created designated Provider Care Teams.  These Care Teams include your primary Cardiologist (physician) and Advanced Practice Providers (APPs -  Physician Assistants and Nurse Practitioners) who all work together to provide you with the care you need, when you need it.  We recommend signing up for the patient portal called "MyChart".  Sign up information is provided on this After Visit Summary.  MyChart is used to connect with patients for Virtual Visits (Telemedicine).  Patients are able to view lab/test results, encounter notes, upcoming appointments, etc.  Non-urgent messages can be sent to your provider as well.   To learn more about what you can do with MyChart, go to [email or web address].    Your next appointment:   6 month(s)  The format for your next appointment:   In Person  Provider:   Bridgette Christopher, MD             

## 2021-10-30 NOTE — Patient Instructions (Signed)
It was wonderful to meet you today. Thank you for allowing me to be a part of your care. Below is a short summary of what we discussed at your visit today:  Your A1c today was 7.8 which is increased from 6.9 about 4 months ago.  I encourage watching your diet and continued exercising.  Increased your baclofen to 15 mg 3 times daily.  For your navel injury recommend cleaning the navel and applying Neosporin ointment as needed.  Placed referral to pain clinic for your hip and knee pain.  You received your flu vaccine today.  Follow-up in November to complete your Pap smear and diabetes follow-up.  Please bring all of your medications to every appointment!  If you have any questions or concerns, please do not hesitate to contact us via phone or MyChart message.   Alen Bleacher, MD [facility]

## 2021-10-31 ENCOUNTER — Encounter: Payer: Self-pay | Admitting: Student

## 2021-11-01 ENCOUNTER — Other Ambulatory Visit: Payer: Self-pay | Admitting: Family Medicine

## 2021-11-06 ENCOUNTER — Ambulatory Visit (INDEPENDENT_AMBULATORY_CARE_PROVIDER_SITE_OTHER): Payer: 59 | Admitting: Orthopaedic Surgery

## 2021-11-06 DIAGNOSIS — M1711 Unilateral primary osteoarthritis, right knee: Secondary | ICD-10-CM

## 2021-11-06 MED ORDER — METHYLPREDNISOLONE ACETATE 40 MG/ML IJ SUSP
40.0000 mg | INTRAMUSCULAR | Status: AC | PRN
  Administered 2021-11-06: 40 mg via INTRA_ARTICULAR

## 2021-11-06 MED ORDER — LIDOCAINE HCL 1 % IJ SOLN
2.0000 mL | INTRAMUSCULAR | Status: AC | PRN
  Administered 2021-11-06: 2 mL

## 2021-11-06 MED ORDER — BUPIVACAINE HCL 0.5 % IJ SOLN
2.0000 mL | INTRAMUSCULAR | Status: AC | PRN
  Administered 2021-11-06: 2 mL via INTRA_ARTICULAR

## 2021-11-06 NOTE — Progress Notes (Signed)
Office Visit Note   Patient: [name]           Date of Birth: 1965-11-07           MRN: [id number] Visit Date: 11/06/2021              Requested by: Mary Bleacher, MD [address] [place],  [place] [address] PCP: Mary Bleacher, MD   Assessment & Plan: Visit Diagnoses:  1. Primary osteoarthritis of right knee     Plan: [name] returns today for follow-up of right knee DJD requesting cortisone injection.  No changes to medical history otherwise.  She tolerated the injection well.  We will see her back as needed.  Follow-Up Instructions: No follow-ups on file.   Orders:  No orders of the defined types were placed in this encounter.  No orders of the defined types were placed in this encounter.     Procedures: Large Joint Inj: R knee on 11/06/2021 11:38 AM Indications: pain Details: 22 G needle  Arthrogram: No  Medications: 40 mg methylPREDNISolone acetate 40 MG/ML; 2 mL lidocaine 1 %; 2 mL bupivacaine 0.5 % Consent was given by the patient. Patient was prepped and draped in the usual sterile fashion.       Clinical Data: No additional findings.   Subjective: Chief Complaint  Patient presents with   Right Knee - Pain    HPI [name] comes in today for right knee DJD requesting injection. Review of Systems   Objective: Vital Signs: LMP 01/29/1992   Physical Exam  Ortho Exam Right knee exam is unchanged. Specialty Comments:  No specialty comments available.  Imaging: No results found.   PMFS History: Patient Active Problem List   Diagnosis Date Noted   Infection of lip 08/27/2021   Lumbar stenosis with neurogenic claudication 06/29/2021   Lumbar radiculopathy 06/29/2021   Biceps tendinopathy of right upper extremity 05/03/2021   Irregular heartbeat 04/11/2021   Neuropathic pain of foot 03/28/2021   Chronic congestion of paranasal sinus 03/28/2021   Impingement syndrome of right shoulder 03/08/2021   Arthritis of right acromioclavicular  joint 03/08/2021   Acute medial meniscus tear of right knee 03/08/2021   Acute lateral meniscus tear of right knee 03/08/2021   Spinal stenosis of thoracic region with radiculopathy    Bilateral leg numbness    Spinal stenosis of lumbar region with neurogenic claudication    Primary osteoarthritis of right knee    Impaired ambulation 01/10/2021   Status post lumbar laminectomy 10/03/2020   Cubital tunnel syndrome on left 05/02/2020   Vaginal discharge 01/20/2020   Prolapse of anterior vaginal wall 11/10/2019   Skin ulcer of left thigh, limited to breakdown of skin ([name]) 09/09/2019   Ulnar nerve compression 08/06/2019   Diabetic neuropathy associated with type 2 diabetes mellitus ([name]) 08/06/2019   Body mass index 50.0-59.9, adult ([place]) 06/12/2019   Impingement syndrome of left shoulder 05/07/2019   Polyp of colon    Polyp of ascending colon    Rectal discomfort 02/12/2019   Osteoarthritis of right hip 02/12/2019   Nontraumatic tear of left supraspinatus tendon 12/30/2018   Gout 12/17/2018   Nontraumatic complete tear of right rotator cuff 12/08/2018   Rotator cuff tear, right 12/01/2018   Tobacco abuse 08/21/2018   Right groin pain 07/20/2018   Depressed mood 07/13/2018   Paroxysmal SVT (supraventricular tachycardia) 03/20/2018   Pure hypercholesterolemia 03/20/2018   Muscle cramping 02/25/2018   Nodule of upper lobe of right lung 02/13/2018  Falls, subsequent encounter 01/30/2018   Pain in left foot 11/04/2017   Essential hypertension 10/27/2017   Solitary pulmonary nodule 10/07/2017   Restrictive lung disease secondary to obesity 10/01/2017   Polycythemia, secondary 10/01/2017   Chronic viral hepatitis B without delta-agent ([name]) 07/08/2017   COPD (chronic obstructive pulmonary disease) with chronic bronchitis 06/27/2017   Chronic kidney disease (CKD), stage IV (severe) ([name]) 01/14/2017   Decubitus ulcer 12/13/2016   Type 2 diabetes mellitus with stage 3 chronic kidney  disease, with long-term current use of insulin ([name]) 12/12/2016   Urge incontinence of urine 12/05/2016   Trigger finger, left index finger 07/15/2016   Back pain 04/07/2014   Morbid obesity ([name]) 10/02/2012   Chronic diastolic CHF (congestive heart failure) ([name]) 04/07/2012   Skin lesion of scalp 03/15/2011   GERD 01/26/2010   Obstructive sleep apnea treated with BiPAP 02/03/2008   FIBROCYSTIC BREAST DISEASE 10/28/2006   Hyperlipidemia 03/27/2006   Obesity hypoventilation syndrome ([name]) 03/27/2006   Depression with anxiety 03/27/2006   Past Medical History:  Diagnosis Date   Alcoholism ([name])    Anxiety    Arthritis 04/10/2011   hips, shoulders, back   Asthma    Bipolar disorder ([name])    Cardiomegaly    Carpal tunnel syndrome, bilateral    Cervical cancer ([place]) 1993   cervical, no treatment done, went away per pt   Cervical dysplasia or atypia 04/10/2011   '93- once dx.-got pregnant-no intervention, then postpartum, no dysplasia found   CHF (congestive heart failure) ([name])    no cardiologist 2014 dx, none now   Chronic hypoxemic respiratory failure ([name])    Chronic kidney disease    stage 3 kidney disease   Condyloma - gluteal cleft 04/09/2011   Removed by general surgery. Pathology showed Condyloma, gluteal CONDYLOMA ACUMINATUM.    COPD (chronic obstructive pulmonary disease) ([name])    Depression    Diabetes mellitus without complication ([name])    no meds today   Dyspnea    with actity, sitting   Fall 12/16/2016   Fibrocystic breast disease    GERD (gastroesophageal reflux disease)    Grade I diastolic dysfunction 82/99/3716   Noted on ECHO   Hepatitis    hep B-count is low at present,doesn't register   History of PSVT (paroxysmal supraventricular tachycardia)    Hypercalcemia    Hyperlipidemia    Hypertension    Incomplete left bundle branch block (LBBB) 09/24/2018   Noted on EKG   Lung nodule 11/2017   right and left lung   Migraine headache    none recent    Morbid obesity ([name]) 03/27/2006   Neuropathy    Pneumonia    walking pneu 15 yrs. ago   Skin lesion 03/15/2011   In gluteal crease now s/p removal by Dr. [name] of General Surgery on 3/12. Path shows condyloma.      Sleep apnea 04/10/2011   does not use after loss   Trigger finger    left third   Urge incontinence of urine     Family History  Problem Relation Age of Onset   Pulmonary embolism Mother    Stroke Father    Alcohol abuse Father    Pneumonia Father    Hypertension Father    Breast cancer Maternal Aunt    Hypertension Other    Diabetes Other        Aunts and cousins   Asthma Sister    Depression Sister    Arthritis Sister  Sickle cell anemia Son     Past Surgical History:  Procedure Laterality Date   ANAL FISTULECTOMY  04/17/2011   Procedure: FISTULECTOMY ANAL;  Surgeon: Imogene Burn. [name], MD;  Location: [facility];  Service: General;  Laterality: N/A;  Excision of Condyloma Gluteal Cleft    ANAL RECTAL MANOMETRY N/A 03/26/2019   Procedure: ANO RECTAL MANOMETRY;  Surgeon: Thornton Park, MD;  Location: [facility];  Service: Gastroenterology;  Laterality: N/A;   BACK SURGERY  2009   pt thinks it was an "interbody fusion"   BIOPSY  03/09/2019   Procedure: BIOPSY;  Surgeon: Thornton Park, MD;  Location: [facility];  Service: Gastroenterology;;   BOTOX INJECTION N/A 12/29/2018   Procedure: BOTOX INJECTION(100 UNITS)  WITH CYSTOSCOPY;  Surgeon: Ceasar Mons, MD;  Location: [facility];  Service: Urology;  Laterality: N/A;   BOTOX INJECTION N/A 07/27/2019   Procedure: BOTOX INJECTION WITH CYSTOSCOPY;  Surgeon: Ceasar Mons, MD;  [facility];  Service: Urology;  Laterality: N/A;  ONLY NEEDS 30 MIN   BOTOX INJECTION N/A 05/02/2021   Procedure: BOTOX INJECTION WITH CYSTOSCOPY;  Surgeon: Ceasar Mons, MD;  Location: [facility];  Service: Urology;  Laterality: N/A;  ONLY NEEDS 30 MIN   BREAST CYST EXCISION      bilateral breast, 3 cysts removed from each breast   BREAST EXCISIONAL BIOPSY Right    x 3   BREAST EXCISIONAL BIOPSY Left    x 3   CARPAL TUNNEL RELEASE Bilateral    Cervical biospy     CERVICAL FUSION  04/10/2011   x3- cervical fusion with plating and screws-Dr. [name]   COLONOSCOPY WITH PROPOFOL N/A 07/06/2015   Procedure: COLONOSCOPY WITH PROPOFOL;  Surgeon: [name], MD;  [facility];  Service: Endoscopy;  Laterality: N/A;   COLONOSCOPY WITH PROPOFOL N/A 03/09/2019   Procedure: COLONOSCOPY WITH PROPOFOL;  Surgeon: Thornton Park, MD;  [facility];  Service: Gastroenterology;  Laterality: N/A;   DOPPLER ECHOCARDIOGRAPHY  04/06/2012   AT [place] 55-60%   FOOT SURGERY Left 2018   FRACTURE SURGERY     little toe left foot   I & D EXTREMITY Left 12/18/2016   Procedure: IRRIGATION AND DEBRIDEMENT LEFT FOOT, CLOSURE;  Surgeon: [name], MD;  Location: [place];  Service: Orthopedics;  Laterality: Left;   INJECTION KNEE Right 10/03/2020   Procedure: KNEE INJECTION;  Surgeon: Jessy Oto, MD;  Location: [facility];  Service: Orthopedics;  Laterality: Right;   KNEE ARTHROSCOPY WITH MEDIAL MENISECTOMY Right 03/21/2021   Procedure: RIGHT KNEE ARTHROSCOPY WITH MEDIAL AND PARTIAL LATERAL MENISCECTOMY;  Surgeon: [name], MD;  Location: [place];  Service: Orthopedics;  Laterality: Right;   LAPAROSCOPIC GASTRIC SLEEVE RESECTION N/A 05/08/2020   Procedure: LAPAROSCOPIC GASTRIC SLEEVE RESECTION;  Surgeon: Kieth Brightly Arta Bruce, MD;  Location[facility];  Service: General;  Laterality: N/A;   [place] SURGERY  04/10/2011   x5-Lumbar fusion-retained hardware.(Dr. Louanne Skye)   LUMBAR LAMINECTOMY N/A 10/03/2020   Procedure: THORACOLUMBAR LAMINECTOMIES THORACIC ELEVEN-TWELVE, THORACIC TWELVE-LUMBAR ONE  AND LUMBAT ONE-TWO;  Surgeon: Jessy Oto, MD;  Location: [place];  Service: Orthopedics;  Laterality: N/A;   LUMBAR PERCUTANEOUS PEDICLE SCREW 2 LEVEL   06/29/2021   Procedure: LUMBAR PERCUTANEOUS PEDICLE SCREW LUMBAR ONE-TWO;  Surgeon: Earnie Larsson, MD;  Location: [facility];  Service: Neurosurgery;;   MULTIPLE TOOTH EXTRACTIONS     POLYPECTOMY  03/09/2019   Procedure: POLYPECTOMY;  Surgeon: Tarri Glenn,  Joelene Millin, MD;  Location: [facility];  Service: Gastroenterology;;   SHOULDER ARTHROSCOPY WITH ROTATOR CUFF REPAIR AND SUBACROMIAL DECOMPRESSION Left 05/07/2019   Procedure: LEFT SHOULDER ARTHROSCOPY WITH DEBRIDEMENT, DISTAL CLAVICLE EXCISION,  SUBACROMIAL DECOMPRESSION AND POSSIBLE ROTATOR CUFF REPAIR;  Surgeon: [name], MD;  Location: [facility];  Service: Orthopedics;  Laterality: Left;   TEE WITHOUT CARDIOVERSION N/A 04/06/2012   Procedure: TRANSESOPHAGEAL ECHOCARDIOGRAM (TEE);  Surgeon: [name], MD;  Location: [facility];  Service: Cardiovascular;  Laterality: N/A;   TRIGGER FINGER RELEASE Right    middle finger   TRIGGER FINGER RELEASE Left 06/28/2016   Procedure: RELEASE TRIGGER FINGER LEFT 3RD FINGER;  Surgeon: [name], MD;  Location: [place];  Service: Orthopedics;  Laterality: Left;   TRIGGER FINGER RELEASE Right 06/12/2016   Procedure: RIGHT INDEX FINGER TRIGGER RELEASE;  Surgeon: [name], MD;  Location: [place];  Service: Orthopedics;  Laterality: Right;   TRIGGER FINGER RELEASE Right 01/19/2018   Procedure: RIGHT RING FINGER TRIGGER FINGER RELEASE;  Surgeon: [name], MD;  Location: [facility];  Service: Orthopedics;  Laterality: Right;   TRIGGER FINGER RELEASE Left 05/07/2019   Procedure: RELEASE TRIGGER FINGER LEFT INDEX FINGER;  Surgeon: [name], MD;  Location: [place];  Service: Orthopedics;  Laterality: Left;   UPPER GI ENDOSCOPY N/A 05/08/2020   Procedure: UPPER GI ENDOSCOPY;  Surgeon: Kieth Brightly, Arta Bruce, MD;  Location: [facility];  Service: General;  Laterality: N/A;   Social History   Occupational History   Occupation: disabled    Comment: Disabled  Tobacco Use   Smoking status: Every Day    Packs/day:  1.00    Years: 40.00    Total pack years: 40.00    Types: Cigarettes    Start date: 01/28/1977   Smokeless tobacco: Never  Vaping Use   Vaping Use: Never used  Substance and Sexual Activity   Alcohol use: No    Alcohol/week: 0.0 standard drinks of alcohol   Drug use: Not Currently    Types: Marijuana, "Crack" cocaine    Comment: hx marijuana usemany years ago, Crack -none since 11/2015   Sexual activity: Not Currently    Partners: Male    Birth control/protection: Post-menopausal

## 2021-11-09 ENCOUNTER — Other Ambulatory Visit: Payer: Self-pay | Admitting: Family Medicine

## 2021-11-09 NOTE — Telephone Encounter (Signed)
Opened in error

## 2021-11-14 ENCOUNTER — Telehealth: Payer: Self-pay

## 2021-11-14 ENCOUNTER — Other Ambulatory Visit: Payer: Self-pay | Admitting: Student

## 2021-11-14 MED ORDER — GABAPENTIN 300 MG PO CAPS: ORAL_CAPSULE | ORAL | 0 refills | Status: DC

## 2021-11-14 NOTE — Telephone Encounter (Signed)
Received phone call from [place] regarding gabapentin prescription. Directions indicate that patient should be taking 7 capsules daily. 2 in the AM, 2 in the afternoon and 3 in the evening, however, quantity of 30.  Pharmacist is requesting that we update the quantity to a one month supply to equal 210 capsules.   If appropriate, please send in new prescription. Thanks.   Talbot Grumbling, RN

## 2021-11-19 ENCOUNTER — Encounter: Payer: Self-pay | Admitting: Podiatry

## 2021-11-21 ENCOUNTER — Telehealth: Payer: Self-pay | Admitting: *Deleted

## 2021-11-21 MED ORDER — KETOCONAZOLE 2 % EX CREA: 1.0000 | TOPICAL_CREAM | Freq: Every day | CUTANEOUS | 0 refills | Status: DC

## 2021-11-21 NOTE — Telephone Encounter (Signed)
Medication request for ketoconazole 2% cream has been approved by physician and sent to pharmacy on file, patient has been notified.

## 2021-11-22 ENCOUNTER — Ambulatory Visit: Payer: 59 | Admitting: Pulmonary Disease

## 2021-11-26 ENCOUNTER — Encounter (HOSPITAL_BASED_OUTPATIENT_CLINIC_OR_DEPARTMENT_OTHER): Payer: Self-pay | Admitting: Urology

## 2021-11-27 ENCOUNTER — Encounter (HOSPITAL_BASED_OUTPATIENT_CLINIC_OR_DEPARTMENT_OTHER): Payer: Self-pay | Admitting: Urology

## 2021-11-27 NOTE — Progress Notes (Signed)
Spoke w/ via phone for pre-op interview--- pt Lab needs dos---- [employer] results------ current ekg in epic/ chart COVID test -----patient states asymptomatic no test needed Arrive at ------- 1015 on 11-28-2021 NPO after MN NO Solid Food.  Clear liquids from MN until--- 0915 Med rec completed Medications to take morning of surgery ----- buspar, gabapentin, claritin, lipitor, effexor, cardizem, baclofen, prilosec, flonase spray, trelegy inhaler Diabetic medication ----- n/a Patient instructed no nail polish to be worn day of surgery Patient instructed to bring photo id and insurance card day of surgery Patient aware to have Driver (ride ) / caregiver  for 24 hours after surgery -- son, [name] Patient Special Instructions ----- do not take metolazone morning of surgery.  Asked to bring rescue inhaler dos Pre-Op special Istructions ----- n/a Patient verbalized understanding of instructions that were given at this phone interview. Patient denies shortness of breath, chest pain, fever, cough at this phone interview.

## 2021-11-28 ENCOUNTER — Ambulatory Visit (HOSPITAL_BASED_OUTPATIENT_CLINIC_OR_DEPARTMENT_OTHER)
Admission: RE | Admit: 2021-11-28 | Discharge: 2021-11-28 | Disposition: A | Discharge status: Home / Self Care | Payer: 59 | Attending: Urology | Admitting: Urology

## 2021-11-28 ENCOUNTER — Encounter (HOSPITAL_BASED_OUTPATIENT_CLINIC_OR_DEPARTMENT_OTHER)
Admission: RE | Disposition: A | Discharge status: Home / Self Care | Payer: Self-pay | Source: Home / Self Care | Attending: Urology

## 2021-11-28 ENCOUNTER — Encounter (HOSPITAL_BASED_OUTPATIENT_CLINIC_OR_DEPARTMENT_OTHER): Payer: Self-pay | Admitting: Urology

## 2021-11-28 ENCOUNTER — Other Ambulatory Visit: Payer: Self-pay

## 2021-11-28 ENCOUNTER — Ambulatory Visit (HOSPITAL_BASED_OUTPATIENT_CLINIC_OR_DEPARTMENT_OTHER): Payer: 59 | Admitting: Anesthesiology

## 2021-11-28 DIAGNOSIS — E662 Morbid (severe) obesity with alveolar hypoventilation: Secondary | ICD-10-CM | POA: Insufficient documentation

## 2021-11-28 DIAGNOSIS — Z6839 Body mass index (BMI) 39.0-39.9, adult: Secondary | ICD-10-CM | POA: Diagnosis not present

## 2021-11-28 DIAGNOSIS — F319 Bipolar disorder, unspecified: Secondary | ICD-10-CM | POA: Insufficient documentation

## 2021-11-28 DIAGNOSIS — E119 Type 2 diabetes mellitus without complications: Secondary | ICD-10-CM

## 2021-11-28 DIAGNOSIS — J45909 Unspecified asthma, uncomplicated: Secondary | ICD-10-CM | POA: Insufficient documentation

## 2021-11-28 DIAGNOSIS — Z7984 Long term (current) use of oral hypoglycemic drugs: Secondary | ICD-10-CM | POA: Diagnosis not present

## 2021-11-28 DIAGNOSIS — I10 Essential (primary) hypertension: Secondary | ICD-10-CM | POA: Diagnosis not present

## 2021-11-28 DIAGNOSIS — N3941 Urge incontinence: Secondary | ICD-10-CM | POA: Diagnosis present

## 2021-11-28 DIAGNOSIS — Z87891 Personal history of nicotine dependence: Secondary | ICD-10-CM

## 2021-11-28 DIAGNOSIS — K219 Gastro-esophageal reflux disease without esophagitis: Secondary | ICD-10-CM | POA: Insufficient documentation

## 2021-11-28 DIAGNOSIS — Z01818 Encounter for other preprocedural examination: Secondary | ICD-10-CM

## 2021-11-28 HISTORY — DX: Type 2 diabetes mellitus without complications: E11.9

## 2021-11-28 HISTORY — PX: BOTOX INJECTION: SHX5754

## 2021-11-28 HISTORY — DX: Presence of dental prosthetic device (complete) (partial): Z97.2

## 2021-11-28 HISTORY — DX: Other psychoactive substance abuse, uncomplicated: F19.10

## 2021-11-28 HISTORY — DX: Other muscle spasm: M62.838

## 2021-11-28 HISTORY — DX: Alcohol dependence, in remission: F10.21

## 2021-11-28 HISTORY — DX: Supraventricular tachycardia, unspecified: I47.10

## 2021-11-28 HISTORY — DX: Polyneuropathy, unspecified: G62.9

## 2021-11-28 HISTORY — DX: Spinal stenosis, site unspecified: M48.00

## 2021-11-28 HISTORY — DX: Chronic kidney disease, stage 3 unspecified: N18.30

## 2021-11-28 HISTORY — DX: Generalized anxiety disorder: F41.1

## 2021-11-28 LAB — POCT I-STAT, CHEM 8
BUN: 24 mg/dL — ABNORMAL HIGH (ref 6–20)
Calcium, Ion: 1.16 mmol/L (ref 1.15–1.40)
Chloride: 106 mmol/L (ref 98–111)
Creatinine, Ser: 1 mg/dL (ref 0.44–1.00)
Glucose, Bld: 161 mg/dL — ABNORMAL HIGH (ref 70–99)
HCT: 53 % — ABNORMAL HIGH (ref 36.0–46.0)
Hemoglobin: 18 g/dL — ABNORMAL HIGH (ref 12.0–15.0)
Potassium: 4 mmol/L (ref 3.5–5.1)
Sodium: 142 mmol/L (ref 135–145)
TCO2: 27 mmol/L (ref 22–32)

## 2021-11-28 LAB — GLUCOSE, CAPILLARY: Glucose-Capillary: 167 mg/dL — ABNORMAL HIGH (ref 70–99)

## 2021-11-28 SURGERY — BOTOX INJECTION
Anesthesia: General | Site: Bladder

## 2021-11-28 MED ORDER — DEXAMETHASONE SODIUM PHOSPHATE 10 MG/ML IJ SOLN
INTRAMUSCULAR | Status: AC
  Filled 2021-11-28: qty 1

## 2021-11-28 MED ORDER — ONDANSETRON HCL 4 MG/2ML IJ SOLN
INTRAMUSCULAR | Status: DC | PRN
  Administered 2021-11-28: 4 mg via INTRAVENOUS

## 2021-11-28 MED ORDER — PROPOFOL 10 MG/ML IV BOLUS
INTRAVENOUS | Status: DC | PRN
  Administered 2021-11-28: 200 mg via INTRAVENOUS
  Administered 2021-11-28: 20 mg via INTRAVENOUS

## 2021-11-28 MED ORDER — SODIUM CHLORIDE 0.9 % IV SOLN: INTRAVENOUS | Status: DC

## 2021-11-28 MED ORDER — STERILE WATER FOR IRRIGATION IR SOLN
Status: DC | PRN
  Administered 2021-11-28: 1500 mL

## 2021-11-28 MED ORDER — CEFAZOLIN SODIUM-DEXTROSE 2-4 GM/100ML-% IV SOLN
2.0000 g | Freq: Once | INTRAVENOUS | Status: AC
  Administered 2021-11-28: 2 g via INTRAVENOUS

## 2021-11-28 MED ORDER — SODIUM CHLORIDE (PF) 0.9 % IJ SOLN
INTRAMUSCULAR | Status: DC | PRN
  Administered 2021-11-28: 20 mL

## 2021-11-28 MED ORDER — FENTANYL CITRATE (PF) 100 MCG/2ML IJ SOLN
INTRAMUSCULAR | Status: AC
  Filled 2021-11-28: qty 2

## 2021-11-28 MED ORDER — MIDAZOLAM HCL 5 MG/5ML IJ SOLN
INTRAMUSCULAR | Status: DC | PRN
  Administered 2021-11-28: 2 mg via INTRAVENOUS

## 2021-11-28 MED ORDER — ONABOTULINUMTOXINA 100 UNITS IJ SOLR
INTRAMUSCULAR | Status: DC | PRN
  Administered 2021-11-28: 200 [IU] via INTRAMUSCULAR

## 2021-11-28 MED ORDER — LIDOCAINE HCL (PF) 2 % IJ SOLN
INTRAMUSCULAR | Status: AC
  Filled 2021-11-28: qty 5

## 2021-11-28 MED ORDER — FENTANYL CITRATE (PF) 100 MCG/2ML IJ SOLN
INTRAMUSCULAR | Status: DC | PRN
  Administered 2021-11-28 (×3): 50 ug via INTRAVENOUS

## 2021-11-28 MED ORDER — MIDAZOLAM HCL 2 MG/2ML IJ SOLN
INTRAMUSCULAR | Status: AC
  Filled 2021-11-28: qty 2

## 2021-11-28 MED ORDER — ONDANSETRON HCL 4 MG/2ML IJ SOLN
INTRAMUSCULAR | Status: AC
  Filled 2021-11-28: qty 2

## 2021-11-28 MED ORDER — PHENYLEPHRINE 80 MCG/ML (10ML) SYRINGE FOR IV PUSH (FOR BLOOD PRESSURE SUPPORT)
PREFILLED_SYRINGE | INTRAVENOUS | Status: DC | PRN
  Administered 2021-11-28: 160 ug via INTRAVENOUS

## 2021-11-28 MED ORDER — LIDOCAINE 2% (20 MG/ML) 5 ML SYRINGE
INTRAMUSCULAR | Status: DC | PRN
  Administered 2021-11-28: 100 mg via INTRAVENOUS

## 2021-11-28 MED ORDER — CEPHALEXIN 500 MG PO CAPS: 500.0000 mg | ORAL_CAPSULE | Freq: Two times a day (BID) | ORAL | 0 refills | Status: AC

## 2021-11-28 MED ORDER — EPHEDRINE 5 MG/ML INJ
INTRAVENOUS | Status: AC
  Filled 2021-11-28: qty 5

## 2021-11-28 MED ORDER — DEXAMETHASONE SODIUM PHOSPHATE 10 MG/ML IJ SOLN
INTRAMUSCULAR | Status: DC | PRN
  Administered 2021-11-28: 5 mg via INTRAVENOUS

## 2021-11-28 MED ORDER — CEFAZOLIN SODIUM-DEXTROSE 2-4 GM/100ML-% IV SOLN
INTRAVENOUS | Status: AC
  Filled 2021-11-28: qty 100

## 2021-11-28 MED ORDER — PROPOFOL 10 MG/ML IV BOLUS
INTRAVENOUS | Status: AC
  Filled 2021-11-28: qty 20

## 2021-11-28 SURGICAL SUPPLY — 20 items
BAG DRAIN URO-CYSTO SKYTR STRL (DRAIN) ×1 IMPLANT
BAG DRN UROCATH (DRAIN) ×1
CATH ROBINSON RED A/P 14FR (CATHETERS) IMPLANT
CLOTH BEACON ORANGE TIMEOUT ST (SAFETY) ×1 IMPLANT
ELECT REM PT RETURN 9FT ADLT (ELECTROSURGICAL) ×1
ELECTRODE REM PT RTRN 9FT ADLT (ELECTROSURGICAL) ×1 IMPLANT
GLOVE BIO SURGEON STRL SZ7.5 (GLOVE) ×1 IMPLANT
GOWN STRL REUS W/TWL LRG LVL3 (GOWN DISPOSABLE) ×3 IMPLANT
KIT TURNOVER CYSTO (KITS) ×1 IMPLANT
MANIFOLD NEPTUNE II (INSTRUMENTS) ×1 IMPLANT
NDL ASPIRATION 22 (NEEDLE) IMPLANT
NDL SAFETY ECLIP 18X1.5 (MISCELLANEOUS) IMPLANT
NEEDLE ASPIRATION 22 (NEEDLE) ×1 IMPLANT
PACK CYSTO (CUSTOM PROCEDURE TRAY) ×1 IMPLANT
SOL PREP POV-IOD 4OZ 10% (MISCELLANEOUS) IMPLANT
SYR 10ML LL (SYRINGE) IMPLANT
SYR 20ML LL LF (SYRINGE) IMPLANT
SYR CONTROL 10ML LL (SYRINGE) IMPLANT
TUBE CONNECTING 12X1/4 (SUCTIONS) ×1 IMPLANT
WATER STERILE IRR 3000ML UROMA (IV SOLUTION) ×1 IMPLANT

## 2021-11-28 NOTE — H&P (Signed)
Office Visit Report     10/29/2021   --------------------------------------------------------------------------------   [name]  MRN: [id number]  DOB: 1965/06/03, 56 year old Female  PRIMARY CARE:  Madison Hickman, MD  REFERRING:  Glena [place]. Lovena Neighbours, MD  PROVIDER:  Ellison Hughs, M.D.  LOCATION:  [facility] [phone]     --------------------------------------------------------------------------------   CC/HPI: Incontinence   [name] is a 56 year old female with a history of cauda equina syndrome, a neurogenic bladder, urge incontinence and nocturnal enuresis. S/p Botox intravesical injections on 07/27/19.   UDS: [name] held a max capacity of approx. 300 mls. Her 1st sensation was felt at 176 mls. No SUI was noted. There was positive low amplitude instability. She did feel an increased urge but was able to inhibit leaking. She was able to generate a voluntary contraction and void. PVR was approx.100 mls. No reflux was seen.   01/15/19: [name] is here today for a routine follow-up. [name] is S/P cystoscopy with Botox injections (100 units) on 12/29/2018. Today, she reports that her nocturnal enuresis and urge incontinence has completely resolved. She does report that she will void a small volume and then have to lean forward and almost Crede in order to completely empty her bladder. She denies suprapubic discomfort, dysuria or gross hematuria. She does report that she has malodorous urine on occasion. She voided immediately prior to her office visit today and was unable to leave a urine specimen. PVR-0 mL   06/09/19: [name] is here today for a routine follow-up. She reports sustained improvement in her urge incontinence and nocturnal enuresis following intravesical botox injections last December. She notes that she can sense when her bladder is full and has plenty of lead-time to make it to Mary bathroom in time. Denies interval UTIs, dysuria  or hematuria. Doing well.   03/10/92: [name] is here today for a routine post-op check-up following her recent botox injections. She reports minimal urinary urgency, frequency and denies incontinence. She states that she has a good FOS and feels like she is emptying well. Denies interval UTIs, dysuria or hematuria. UA today shows signs of cystitis.   02/08/21: [name] is here today for a routine follow-up. She had gastric sleeve surgery and multiple joint/spinal surgeries over Mary past 18 months. Her last botox injection was in 2021 and she reports worsening incontinence. Nocturia x 3-4 with episodes of enuresis. She reports sporadic episodes of dysuria, but hasn't been diagnosed with any interval UTIs. UA today is concerning for UTI. No flank pain or hematuria.   10/29/21: [name] is here today for a routine follow-up. She is doing well and has lost 140 lbs over Mary past year, s/p gastric bypass. From a urinary standpoint, she reports slightly more urinary urgency over Mary past 3-4 weeks. Denies interval UTIs, dysuria or hematuria.     ALLERGIES: Aspirin Methadone HCl TABS    MEDICATIONS: Allopurinol  Hydrochlorothiazide  Omeprazole  Albuterol Sulfate Hfa  Amlodipine Besylate  Ammonium Lactate  Atorvastatin Calcium 40 mg tablet  Baclofen  Buspirone Hcl 10 mg tablet  Calcium  Citalopram Hbr 20 mg tablet  Diclofenac Sodium 1 % gel  Diltiazem 12Hr Er  Docusate Sodium  Fluticasone Propionate 50 mcg/actuation spray, suspension  Fluticasone Propionate  Gabapentin 300 mg capsule  Hydroxyzine Hcl  Ketoconazole 2 % cream  Loratadine 10 mg tablet  Methocarbamol 750 mg tablet  Metolazone  Montelukast Sodium 10 mg tablet  Multivitamin  Mupirocin 2 %  ointment  Narcan  Nystatin  Potassium  Prednisolone  Trelegy Ellipta  Tylenol  Venlafaxine Hcl     Notes: pt start new insulin will call back to advise Mary name   GU PSH: Catheterization For Collection Of Specimen, Single  Osborne, All Places Of Service - 2019 Catheterize For Residual - 2019 Complex cystometrogram, w/ void pressure and urethral pressure profile studies, any technique - 2020 Complex Uroflow - 2020 Cystourethroscopy, W/Injection For Chemodenervation Of Bladder - 05/02/2021, 2021, 2020 Emg surf Electrd - 2020 Inject For cystogram - 2020 Intrabd voidng Press - 2020       PSH Notes: Back Surgery, Neck Surgery   NON-GU PSH: Back Surgery (Unspecified) Breast Surgery Procedure Foot surgery (unspecified), Left Neck Surgery (Unspecified)     GU PMH: Acute Cystitis/UTI - 02/08/2021 Urge incontinence - 02/08/2021, (Stable), - 2020, Urge incontinence of urine, - 2014 Nocturia - 2020 Bladder, Neuromuscular dysfunction, Unspec, Neurogenic bladder - 2014 Urinary Frequency, Increased urinary frequency - 2014 Urinary Retention, Unspec, Incomplete bladder emptying - 2014      PMH Notes:  2005-12-31 11:12:16 - Note: Cancer  2005-12-31 11:12:16 - Note: Arthritis   NON-GU PMH: Cauda equina syndrome - 02/08/2021, - 2019 Glycosuria - 2020 Diabetes Type 2 - 2019 Obesity - 2019 Asthma, Asthma - 2014 Hyperthyroidism, Hyperthyroidism - 2014 Personal history of diseases of Mary blood and blood-forming organs and certain disorders involving Mary immune mechanism, History of sickle cell anemia - 2014 Personal history of other diseases of Mary circulatory system, History of hypertension - 2014 Personal history of other endocrine, nutritional and metabolic disease, History of hypercholesterolemia - 2014 Personal history of other mental and behavioral disorders, History of depression - 2014 Personal history of other specified conditions, History of heartburn - 2014 Encounter for general adult medical examination without abnormal findings, Encounter for preventive health examination    FAMILY HISTORY: 1 son - Son   SOCIAL HISTORY: Marital Status: Single Preferred Language: English Current Smoking Status:  Osborne smokes occasionally.   Tobacco Use Assessment Completed: Used Tobacco in last 30 days? Has never drank.  Does not drink caffeine. Osborne's occupation is/was Disabled.     Notes: Marital History - Divorced, Alcohol Use, Occupation:, Tobacco Use, Caffeine Use, Death In Mary Family Father   REVIEW OF SYSTEMS:    GU Review Female:   Osborne denies leakage of urine, burning /pain with urination, hard to postpone urination, frequent urination, stream starts and stops, have to strain to urinate, get up at night to urinate, being pregnant, and trouble starting your stream.  Gastrointestinal (Upper):   Osborne denies nausea, vomiting, and indigestion/ heartburn.  Gastrointestinal (Lower):   Osborne denies diarrhea and constipation.  Constitutional:   Osborne denies fever, night sweats, weight loss, and fatigue.  Skin:   Osborne denies skin rash/ lesion and itching.  Eyes:   Osborne denies blurred vision and double vision.  Ears/ Nose/ Throat:   Osborne denies sore throat and sinus problems.  Hematologic/Lymphatic:   Osborne denies swollen glands and easy bruising.  Cardiovascular:   Osborne denies leg swelling and chest pains.  Respiratory:   Osborne denies cough and shortness of breath.  Endocrine:   Osborne denies excessive thirst.  Musculoskeletal:   Osborne denies back pain and joint pain.  Neurological:   Osborne denies headaches and dizziness.  Psychologic:   Osborne denies depression and anxiety.   VITAL SIGNS:      10/29/2021 02:23 PM  Weight 275 lb / 124.74 kg  Height 69 in /  175.26 cm  BP 124/80 mmHg  Pulse 111 /min  Temperature 97.5 F / 36.3 C  BMI 40.6 kg/m   MULTI-SYSTEM PHYSICAL EXAMINATION:    Constitutional: Well-nourished. No physical deformities. Normally developed. Good grooming.  Neurologic / Psychiatric: Oriented to time, oriented to place, oriented to person. No depression, no anxiety, no agitation.  Musculoskeletal: Normal gait and station of head and neck.      PAST DATA REVIEW: None   PROCEDURES:          Urinalysis w/Scope Dipstick Dipstick Cont'd Micro  Color: Yellow Bilirubin: Neg mg/dL WBC/hpf: 40 - 60/hpf  Appearance: Cloudy Ketones: Neg mg/dL RBC/hpf: NS (Not Seen)  Specific Gravity: 1.025 Blood: Neg ery/uL Bacteria: Many (>50/hpf)  pH: 5.5 Protein: Neg mg/dL Cystals: NS (Not Seen)  Glucose: Neg mg/dL Urobilinogen: 0.2 mg/dL Casts: NS (Not Seen)    Nitrites: Positive Trichomonas: Not Present    Leukocyte Esterase: 3+ leu/uL Mucous: Not Present      Epithelial Cells: 6 - 10/hpf      Yeast: NS (Not Seen)      Sperm: Not Present    ASSESSMENT: None   PLAN:           Orders Labs Urine Culture          Schedule Return Visit/Planned Activity: Next Available Appointment - Schedule Surgery          Document Letter(s):  Created for Osborne: Clinical Summary         Notes:   -Plan for cysto w/ botox injections in Mary coming weeks (200 units). Risks discussed. We specifically discussed Mary risk of urinary retention and Mary need for an indwelling Foley or CIC. She voices understanding and wishes to proceed.  -Urine C&S pending

## 2021-11-28 NOTE — Discharge Instructions (Addendum)
  Post Anesthesia Home Care Instructions  Activity: Get plenty of rest for the remainder of the day. A responsible individual must stay with you for 24 hours following the procedure.  For the next 24 hours, DO NOT: -Drive a car -Paediatric nurse -Drink alcoholic beverages -Take any medication unless instructed by your physician -Make any legal decisions or sign important papers.  Meals: Start with liquid foods such as gelatin or soup. Progress to regular foods as tolerated. Avoid greasy, spicy, heavy foods. If nausea and/or vomiting occur, drink only clear liquids until the nausea and/or vomiting subsides. Call your physician if vomiting continues.  Special Instructions/Symptoms: Your throat may feel dry or sore from the anesthesia or the breathing tube placed in your throat during surgery. If this causes discomfort, gargle with warm salt water. The discomfort should disappear within 24 hours.  If you had a scopolamine patch placed behind your ear for the management of post- operative nausea and/or vomiting:  1. The medication in the patch is effective for 72 hours, after which it should be removed.  Wrap patch in a tissue and discard in the trash. Wash hands thoroughly with soap and water. 2. You may remove the patch earlier than 72 hours if you experience unpleasant side effects which may include dry mouth, dizziness or visual disturbances. 3. Avoid touching the patch. Wash your hands with soap and water after contact with the patch.      CYSTOSCOPY HOME CARE INSTRUCTIONS  Activity: Rest for the remainder of the day.  Do not drive or operate equipment today.  You may resume normal activities in one to two days as instructed by your physician.   Meals: Drink plenty of liquids and eat light foods such as gelatin or soup this evening.  You may return to a normal meal plan tomorrow.  Return to Work: You may return to work in one to two days or as instructed by your  physician.  Special Instructions / Symptoms: Call your physician if any of these symptoms occur:   -persistent or heavy bleeding  -bleeding which continues after first few urination  -large blood clots that are difficult to pass  -urine stream diminishes or stops completely  -fever equal to or higher than 101 degrees Farenheit.  -cloudy urine with a strong, foul odor  -severe pain  Females should always wipe from front to back after elimination.  You may feel some burning pain when you urinate.  This should disappear with time.  Applying moist heat to the lower abdomen or a hot tub bath may help relieve the pain. \

## 2021-11-28 NOTE — Anesthesia Postprocedure Evaluation (Signed)
Anesthesia Post Note  Patient: [name]  Procedure(s) Performed: BOTOX INJECTION WITH CYSTOSCOPY, 200 UNITS (Bladder)     Patient location during evaluation: PACU Anesthesia Type: General Level of consciousness: awake and alert Pain management: pain level controlled Vital Signs Assessment: post-procedure vital signs reviewed and stable Respiratory status: spontaneous breathing, nonlabored ventilation, respiratory function stable and patient connected to nasal cannula oxygen Cardiovascular status: blood pressure returned to baseline and stable Postop Assessment: no apparent nausea or vomiting Anesthetic complications: no   No notable events documented.  Last Vitals:  Vitals:   11/28/21 1251 11/28/21 1300  BP: (!) 162/96 (!) 154/90  Pulse: 80 87  Resp: 14 20  Temp: 36.5 C   SpO2: 97% 98%    Last Pain:  Vitals:   11/28/21 1251  TempSrc:   PainSc: 0-No pain                 Naythen Heikkila S

## 2021-11-28 NOTE — Op Note (Signed)
Operative Note  Preoperative diagnosis:  1.  Refractory urge incontinence  Postoperative diagnosis: 1.  Refractory urge incontinence  Procedure(s): 1.  Cystoscopy intravesicular Botox injections (200 units)  Surgeon: Ellison Hughs, MD  Assistants:  None  Anesthesia:  General  Complications:  None  EBL: Less than 5 no  Specimens: 1.  None  Drains/Catheters: 1.  None  Intraoperative findings:   No intravesical or urethral abnormalities were seen  Indication:  [name] is a 56 y.o. female with refractory urge incontinence that has been well managed with periodic intravesicular Botox injections.  She is here today for repeat injections.  She has been consented for the above procedures, voices understanding and wishes to proceed.  Description of procedure:  After informed consent was obtained, the patient was brought to the operating room and general LMA anesthesia was administered. The patient was then placed in the dorsolithotomy position and prepped and draped in the usual sterile fashion. A timeout was performed. A 23 French rigid cystoscope was then inserted into the urethral meatus and advanced into the bladder under direct vision. A complete bladder survey revealed no intravesical pathology.   I then injected a total of 200 units of Botox diluted in 20 mL of saline in 0.5 mL aliquots throughout the detrusor musculature, and a grid like fashion.  There was no evidence of bleeding following the injections.  The patient's bladder was then partially drained.  She tolerated the procedure well and was transferred to the postanesthesia in stable condition.  Plan:  d/c home.  Follow-up on 12/05/2021 for PVR

## 2021-11-28 NOTE — Anesthesia Preprocedure Evaluation (Signed)
Anesthesia Evaluation  Patient identified by MRN, date of birth, ID band Patient awake    Reviewed: Allergy & Precautions, NPO status , Patient's Chart, lab work & pertinent test results  Airway Mallampati: II  TM Distance: >3 FB Neck ROM: Full    Dental no notable dental hx. (+) Upper Dentures   Pulmonary shortness of breath, asthma , former smoker,  Obesity hypoventilation syndrome   Pulmonary exam normal breath sounds clear to auscultation       Cardiovascular hypertension, Normal cardiovascular exam Rhythm:Regular Rate:Normal     Neuro/Psych Bipolar Disorder negative neurological ROS     GI/Hepatic GERD  ,(+)     substance abuse  cocaine use,   Endo/Other  diabetes, Type 2, Insulin DependentMorbid obesity  Renal/GU Renal InsufficiencyRenal disease  negative genitourinary   Musculoskeletal negative musculoskeletal ROS (+)   Abdominal   Peds negative pediatric ROS (+)  Hematology negative hematology ROS (+)   Anesthesia Other Findings   Reproductive/Obstetrics negative OB ROS                             Anesthesia Physical Anesthesia Plan  ASA: 3  Anesthesia Plan: General   Post-op Pain Management: Minimal or no pain anticipated   Induction: Intravenous  PONV Risk Score and Plan: 3 and Ondansetron, Dexamethasone and Treatment may vary due to age or medical condition  Airway Management Planned: LMA  Additional Equipment:   Intra-op Plan:   Post-operative Plan: Extubation in OR  Informed Consent: I have reviewed the patients History and Physical, chart, labs and discussed the procedure including the risks, benefits and alternatives for the proposed anesthesia with the patient or authorized representative who has indicated his/her understanding and acceptance.     Dental advisory given  Plan Discussed with: CRNA and Surgeon  Anesthesia Plan Comments:          Anesthesia Quick Evaluation

## 2021-11-28 NOTE — Transfer of Care (Signed)
Immediate Anesthesia Transfer of Care Note  Patient: [name]  Procedure(s) Performed: BOTOX INJECTION WITH CYSTOSCOPY, 200 UNITS (Bladder)  Patient Location: PACU  Anesthesia Type:General  Level of Consciousness: awake, alert , oriented and patient cooperative  Airway & Oxygen Therapy: Patient Spontanous Breathing and Patient connected to face mask oxygen  Post-op Assessment: Report given to RN and Post -op Vital signs reviewed and stable  Post vital signs: Reviewed and stable  Last Vitals:  Vitals Value Taken Time  BP 162/96 11/28/21 1251  Temp    Pulse 83 11/28/21 1252  Resp 14 11/28/21 1252  SpO2 91 % 11/28/21 1252  Vitals shown include unvalidated device data.  Last Pain:  Vitals:   11/28/21 1012  TempSrc: Oral      Patients Stated Pain Goal: 2 (13/08/65 7846)  Complications: No notable events documented.

## 2021-11-28 NOTE — Anesthesia Procedure Notes (Signed)
Procedure Name: LMA Insertion Date/Time: 11/28/2021 12:16 PM  Performed by: Rogers Blocker, CRNAPre-anesthesia Checklist: Patient identified, Emergency Drugs available, Suction available and Patient being monitored Patient Re-evaluated:Patient Re-evaluated prior to induction Oxygen Delivery Method: Circle System Utilized Preoxygenation: Pre-oxygenation with 100% oxygen Induction Type: IV induction Ventilation: Mask ventilation without difficulty LMA: LMA inserted LMA Size: 4.0 Number of attempts: 1 Placement Confirmation: positive ETCO2 Tube secured with: Tape Dental Injury: Teeth and Oropharynx as per pre-operative assessment

## 2021-11-29 ENCOUNTER — Encounter (HOSPITAL_BASED_OUTPATIENT_CLINIC_OR_DEPARTMENT_OTHER): Payer: Self-pay | Admitting: Urology

## 2021-12-04 ENCOUNTER — Ambulatory Visit (INDEPENDENT_AMBULATORY_CARE_PROVIDER_SITE_OTHER): Payer: 59 | Admitting: Student

## 2021-12-04 ENCOUNTER — Ambulatory Visit (INDEPENDENT_AMBULATORY_CARE_PROVIDER_SITE_OTHER): Payer: 59 | Admitting: Plastic Surgery

## 2021-12-04 ENCOUNTER — Encounter: Payer: Self-pay | Admitting: Plastic Surgery

## 2021-12-04 ENCOUNTER — Encounter: Payer: Self-pay | Admitting: Student

## 2021-12-04 ENCOUNTER — Other Ambulatory Visit (HOSPITAL_COMMUNITY)
Admission: RE | Admit: 2021-12-04 | Discharge: 2021-12-04 | Disposition: A | Discharge status: Home / Self Care | Payer: 59 | Source: Ambulatory Visit | Attending: Family Medicine | Admitting: Family Medicine

## 2021-12-04 VITALS — BP 155/92 | HR 80 | Ht 69.0 in | Wt 271.8 lb

## 2021-12-04 VITALS — BP 168/96 | HR 97 | Ht 69.0 in | Wt 271.4 lb

## 2021-12-04 DIAGNOSIS — N184 Chronic kidney disease, stage 4 (severe): Secondary | ICD-10-CM | POA: Diagnosis not present

## 2021-12-04 DIAGNOSIS — Z113 Encounter for screening for infections with a predominantly sexual mode of transmission: Secondary | ICD-10-CM

## 2021-12-04 DIAGNOSIS — I10 Essential (primary) hypertension: Secondary | ICD-10-CM | POA: Diagnosis not present

## 2021-12-04 DIAGNOSIS — E1122 Type 2 diabetes mellitus with diabetic chronic kidney disease: Secondary | ICD-10-CM | POA: Diagnosis not present

## 2021-12-04 DIAGNOSIS — M109 Gout, unspecified: Secondary | ICD-10-CM

## 2021-12-04 DIAGNOSIS — B181 Chronic viral hepatitis B without delta-agent: Secondary | ICD-10-CM

## 2021-12-04 DIAGNOSIS — M793 Panniculitis, unspecified: Secondary | ICD-10-CM | POA: Diagnosis not present

## 2021-12-04 DIAGNOSIS — Z1151 Encounter for screening for human papillomavirus (HPV): Secondary | ICD-10-CM | POA: Insufficient documentation

## 2021-12-04 DIAGNOSIS — Z01419 Encounter for gynecological examination (general) (routine) without abnormal findings: Secondary | ICD-10-CM | POA: Insufficient documentation

## 2021-12-04 DIAGNOSIS — F17210 Nicotine dependence, cigarettes, uncomplicated: Secondary | ICD-10-CM

## 2021-12-04 DIAGNOSIS — R21 Rash and other nonspecific skin eruption: Secondary | ICD-10-CM

## 2021-12-04 DIAGNOSIS — Z9884 Bariatric surgery status: Secondary | ICD-10-CM

## 2021-12-04 DIAGNOSIS — Z9889 Other specified postprocedural states: Secondary | ICD-10-CM

## 2021-12-04 DIAGNOSIS — Z124 Encounter for screening for malignant neoplasm of cervix: Secondary | ICD-10-CM | POA: Diagnosis not present

## 2021-12-04 DIAGNOSIS — N1831 Chronic kidney disease, stage 3a: Secondary | ICD-10-CM

## 2021-12-04 DIAGNOSIS — G4733 Obstructive sleep apnea (adult) (pediatric): Secondary | ICD-10-CM

## 2021-12-04 DIAGNOSIS — Z794 Long term (current) use of insulin: Secondary | ICD-10-CM

## 2021-12-04 MED ORDER — LOSARTAN POTASSIUM 25 MG PO TABS: 25.0000 mg | ORAL_TABLET | Freq: Every day | ORAL | 3 refills | Status: DC

## 2021-12-04 NOTE — Patient Instructions (Addendum)
It was wonderful to see you today. Thank you for allowing me to be a part of your care. Below is a short summary of what we discussed at your visit today:  Today we collected lab to check for Pap smear.    We also tested you for STD that included gonorrhea, chlamydia, trichomoniasis, syphilis and HIV  Blood pressure was elevated so I started you on losartan which you will take daily.  Follow-up in 2 weeks to check your kidney function.   Please bring all of your medications to every appointment!  If you have any questions or concerns, please do not hesitate to contact us via phone or MyChart message.   Alen Bleacher, MD [facility]

## 2021-12-04 NOTE — Progress Notes (Signed)
Annual Wellness Visit     Patient: [name], Female    DOB: 1965-06-22, 56 y.o.   MRN: [id number]  Subjective  Chief Complaint  Patient presents with   Annual Exam    [name] is a 56 y.o. female who presents today for her Annual Wellness Visit. She reports consuming a general diet. Exercise is limited by orthopedic condition(s): back pain had 2 back surgeries 1 this year and 1 last year. She generally feels fairly well overall but concerned about back pain. She reports sleeping well. She does have additional problems to discuss today. Patient is concerned about her BP occasional 160s/100s   Hypertension Patient reports BP elevated at home when she checks it it was in the 160s. Not on any BP meds currently. Denies any headache or vision changes.  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 Last A1c was 7.8 over a month ago. She is not on any home DM meds currently but was previously on insulin. Reports A1c was high last month because she wasn't eating right. Expressed no interest in starting insulin now but wants to work on dieting and exercises.   Back pain Still significant. She goes to pain management where she gets steroid injection which patient said wasn't helpful. Gabapentin, topical voltaren gel and Buspar provide mild relief  Pap Smear Patient is due for Pap smear. Last pap was in 2020 and normal. [name] interested in STD testing including HIV and syphilis testing.  Denies any irritation, odor or discharge  Smoking: Smokes 1 pack a day for 44 years and interested in  Sexually active: Yes with one partner and consistently use condom Work: Unemployed  Living space: Live alone in her house.   Objective  BP (!) 155/92   Pulse 80   Ht '5\' 9"'$  (1.753 m)   Wt 271 lb 12.8 oz (123.3 kg)   LMP 01/29/1992   SpO2 98%   BMI 40.14 kg/m    Physical Exam General: Alert, well appearing, NAD HEENT: Atraumatic, MMM, No  sclera icterus CV: RRR, no murmurs, normal S1/S2 Pulm: CTAB, good WOB on RA, no crackles or wheezing Abd: Soft, no distension, no tenderness Skin: dry, warm Ext: No BLE edema, +2 Pedal and radial pulse.  Genitalia:  Normal introitus for age, no external lesions, white mild vaginal discharge. N0 lesions, no vaginal atrophy, no friaility or hemorrhage, normal uterus size and position   Chaperone: Lavell Anchors CMA.  Assessment & Plan   Annual wellness visit done today including the all of the following: Reviewed patient's Family Medical History Reviewed and updated list of patient's medical providers Assessment of cognitive impairment was done Assessed patient's functional ability Established a written schedule for health screening [place] Completed and Reviewed    Problem List Items Addressed This Visit     Type 2 diabetes mellitus with stage 3 chronic kidney disease, with long-term current use of insulin ([place]) Last A1c over a month ago was 7.8.  Not currently on any medication and declined restarting insulin.  Through shared decision making patient agreed to continue working on exercising and dieting for now.  With plans to recheck A1c in 2 months.  At that time if A1c is elevated would look into restarting patient's insulin or consider starting metformin.  Essential hypertension Patient will poorly controlled hypertension.  Blood pressure is steadily high from her previous last visits.  Not on any current medication. She is asymptomatic at this  time.  Given patient's history of diabetes patient on losartan 25 mg.  Plan to follow-up in 1-2 weeks for renal function test and to reassess ambulatory BP.   Healthcare maintenance Pap cytology collected.  STD screening -Collected sample for gonorrhea, chlamydia and trichomonas collected. -Order lab for RPR and HIV.   Mary Bleacher, MD

## 2021-12-04 NOTE — Assessment & Plan Note (Signed)
Last A1c over a month ago was 7.8.  Not currently on any medication and declined restarting insulin.  Through shared decision making patient agreed to continue working on exercising and dieting for now.  With plans to recheck A1c in 2 months.  At that time if A1c is elevated would look into restarting patient's insulin or consider starting metformin.

## 2021-12-04 NOTE — Assessment & Plan Note (Addendum)
Patient will poorly controlled hypertension.  Blood pressure is steadily high from her previous last visits.  Not on any current medication. She is asymptomatic at this time.  Given patient's history of diabetes patient on losartan 25 mg.  Plan to follow-up in 1-2 weeks for renal function test and to reassess ambulatory BP.

## 2021-12-04 NOTE — Progress Notes (Signed)
Patient ID: [name], female    DOB: [birth year], 56 y.o.   MRN: [id number]   Chief Complaint  Patient presents with   Advice Only   Skin Problem    The patient is a 56 year old female here for evaluation of her abdomen she is 5 feet 9 inches tall and weighs 271 pounds.  She had gastric bypass in April 2022 from [facility] and was able to decrease her weight from 400 to 271 pounds.  Her goal is 195 pounds.  She has had multiple surgeries including back surgeries with the last one being in June 2023.  She is a smoker.  She complains of back pain and rashes in her skin creases.  She uses nystatin cream and different ointments to try and improve the skin breakdown.  She has had trouble healing in the past with previous surgeries.  Do not feel a hernia but her abdomen is quite large so it is difficult to assess any thing that may be smaller.  The patient has multiple medical conditions including bipolar, heart disease, diabetes, kidney disease and depression.    Review of Systems  Constitutional:  Positive for activity change. Negative for appetite change.  Eyes: Negative.   Respiratory: Negative.  Negative for chest tightness and shortness of breath.   Cardiovascular: Negative.   Gastrointestinal: Negative.   Endocrine: Negative.   Genitourinary: Negative.   Musculoskeletal:  Positive for back pain, joint swelling and neck pain.  Skin:  Positive for rash.    Past Medical History:  Diagnosis Date   Alcoholism in remission ([place])    11-26-2021  pt stated no alcohol few yrs ago   Asthma    Bipolar disorder ([place])    Chronic diastolic CHF (congestive heart failure) ([place]) 2014   followed by dr b. Harrell Gave;   Chronic hypoxemic respiratory failure [facility])    pulmonologist--- dr Valeta Harms;   (11-26-2021 per pt was on supplemental oxygen until she 01/ 2022 did not need it,  does not check O2 sats at home anymore.   CKD (chronic kidney disease), stage III [facility])     nephrologist--- dr Carolin Sicks   COPD (chronic obstructive pulmonary disease) ([place])    11-26-2021 pt trying to quit smoking ,  started age 10   Depression    Diabetes mellitus type 2, diet-controlled ([place])    followed by pcp   (11-26-2021  per pt last A1c by pcp on 10-30-2021 7.8,  stated does not check blood sugar)   Dyspnea    Fibrocystic breast disease    GAD (generalized anxiety disorder)    GERD (gastroesophageal reflux disease)    Hepatitis B surface antigen positive 11/2017   pt stated asymptomatic ,  no treatment   History of cervical dysplasia 04/10/2011   '93- once dx.-got pregnant-no intervention, then postpartum, no dysplasia found   History of condyloma acuminatum 04/09/2011   Hyperlipidemia    Hypertension    Incomplete left bundle branch block (LBBB)    Lung nodule 11/2017   pulmonologist--- dr Valeta Harms;  RUL and RLL   Muscle spasm of both lower legs    chronic , due to back issues,   OA (osteoarthritis) 04/10/2011   hips, shoulders, back   OSA (obstructive sleep apnea) 2009   study on epic 09-18-2007 mild osa w/ hypoxmia ;   (11-26-2021 per pt last used cpap 01/ 2022 before bariatric surgery 04/ 2022 stated lost wt / and stopped needing supplemental oxygen , did not  need anymore)   Polyneuropathy    due to back issues and DM   Polysubstance abuse ([place])    11-26-2021  pt hx cocaine use stated none for yrs but ED visit 08-14-2021  positive UDS,  pt stated it was once only for depression none since   PSVT (paroxysmal supraventricular tachycardia)    followed by cardiology   Spinal stenosis, unspecified spinal region    Urge incontinence of urine    Wears dentures    upper    Past Surgical History:  Procedure Laterality Date   ANAL FISTULECTOMY  04/17/2011   Procedure: FISTULECTOMY ANAL;  Surgeon: Imogene Burn. [name], MD;  [facility]: [facility];  Service: General;  Laterality: N/A;  Excision of Condyloma Gluteal Cleft    ANAL RECTAL MANOMETRY N/A 03/26/2019   Procedure: ANO  RECTAL MANOMETRY;  Surgeon: Thornton Park, MD;  [facility];  Service: Gastroenterology;  Laterality: N/A;   BIOPSY  03/09/2019   Procedure: BIOPSY;  Surgeon: Thornton Park, MD;  [facility];  Service: Gastroenterology;;   BOTOX INJECTION N/A 12/29/2018   Procedure: BOTOX INJECTION(100 UNITS)  WITH CYSTOSCOPY;  Surgeon: Ceasar Mons, MD;  [facility];  Service: Urology;  Laterality: N/A;   BOTOX INJECTION N/A 07/27/2019   Procedure: BOTOX INJECTION WITH CYSTOSCOPY;  Surgeon: Ceasar Mons, MD;  [facility];  Service: Urology;  Laterality: N/A;  ONLY NEEDS 30 MIN   BOTOX INJECTION N/A 05/02/2021   Procedure: BOTOX INJECTION WITH CYSTOSCOPY;  Surgeon: Ceasar Mons, MD;  [facility];  Service: Urology;  Laterality: N/A;  ONLY NEEDS 30 MIN   BOTOX INJECTION N/A 11/28/2021   Procedure: BOTOX INJECTION WITH CYSTOSCOPY, 200 UNITS;  Surgeon: Ceasar Mons, MD;  Location: [facility];  Service: Urology;  Laterality: N/A;  ONLY NEEDS 30 MIN   BREAST EXCISIONAL BIOPSY Right    x 3   BREAST EXCISIONAL BIOPSY Left    12-29-2000  and 11-30-2001 _0  by dr Marlou Starks   CARPAL TUNNEL RELEASE Bilateral    right 08-30-2002 and left 07-04-2003  by dr Louanne Skye @ [facility]   CATARACT EXTRACTION W/ INTRAOCULAR LENS IMPLANT Bilateral 12/2020   CERVICAL FUSION  04/10/2011   x3- cervical fusion with plating and screws-Dr. [name]   COLONOSCOPY WITH PROPOFOL N/A 07/06/2015   Procedure: COLONOSCOPY WITH PROPOFOL;  Surgeon: [name], MD;  [facility];  Service: Endoscopy;  Laterality: N/A;   COLONOSCOPY WITH PROPOFOL N/A 03/09/2019   Procedure: COLONOSCOPY WITH PROPOFOL;  Surgeon: Thornton Park, MD;  [facility];  Service: Gastroenterology;  Laterality: N/A;   FRACTURE SURGERY     little toe left foot   I & D EXTREMITY Left 12/18/2016   Procedure: IRRIGATION AND DEBRIDEMENT LEFT  FOOT, CLOSURE;  Surgeon: [name], MD;  Location: [place];  Service: Orthopedics;  Laterality: Left;   INCISION AND DRAINAGE ABSCESS Right 05/03/2002   _1  by dr Marlou Starks;   right breast abscess   INJECTION KNEE Right 10/03/2020   Procedure: KNEE INJECTION;  Surgeon: Jessy Oto, MD;  Location: [place];  Service: Orthopedics;  Laterality: Right;   KNEE ARTHROSCOPY WITH MEDIAL MENISECTOMY Right 03/21/2021   Procedure: RIGHT KNEE ARTHROSCOPY WITH MEDIAL AND PARTIAL LATERAL MENISCECTOMY;  Surgeon: [name], MD;  Location: [place];  Service: Orthopedics;  Laterality: Right;   LAPAROSCOPIC GASTRIC SLEEVE RESECTION N/A 05/08/2020   Procedure: LAPAROSCOPIC GASTRIC SLEEVE RESECTION;  Surgeon: Kieth Brightly Arta Bruce, MD;  [facility];  Service: General;  Laterality: N/A;   [place] [facility]  04/10/2011   x5-Lumbar fusion-retained hardware.(Dr. Louanne Skye)   LUMBAR LAMINECTOMY N/A 10/03/2020   Procedure: THORACOLUMBAR LAMINECTOMIES THORACIC ELEVEN-TWELVE, THORACIC TWELVE-LUMBAR ONE  AND LUMBAT ONE-TWO;  Surgeon: Jessy Oto, MD;  Location: [place];  Service: Orthopedics;  Laterality: N/A;   LUMBAR PERCUTANEOUS PEDICLE SCREW 2 LEVEL  06/29/2021   Procedure: LUMBAR PERCUTANEOUS PEDICLE SCREW LUMBAR ONE-TWO;  Surgeon: Earnie Larsson, MD;  Location: [place];  Service: Neurosurgery;;   MULTIPLE TOOTH EXTRACTIONS     POLYPECTOMY  03/09/2019   Procedure: POLYPECTOMY;  Surgeon: Thornton Park, MD;  [facility];  Service: Gastroenterology;;   POSTERIOR LUMBAR FUSION  04/04/2005   _0  by dr Louanne Skye;   L3-- L5   POSTERIOR LUMBAR FUSION  03/04/2006   _1  dr Louanne Skye;   fusion L2--3 and redo L3--5   SHOULDER ARTHROSCOPY WITH ROTATOR CUFF REPAIR AND SUBACROMIAL DECOMPRESSION Left 05/07/2019   Procedure: LEFT SHOULDER ARTHROSCOPY WITH DEBRIDEMENT, DISTAL CLAVICLE EXCISION,  SUBACROMIAL DECOMPRESSION AND POSSIBLE ROTATOR CUFF REPAIR;  Surgeon: [name], MD;  Location: [place];  Service:  Orthopedics;  Laterality: Left;   TEE WITHOUT CARDIOVERSION N/A 04/06/2012   Procedure: TRANSESOPHAGEAL ECHOCARDIOGRAM (TEE);  Surgeon: [name], MD;  [facility];  Service: Cardiovascular;  Laterality: N/A;   TRIGGER FINGER RELEASE Right    middle finger   TRIGGER FINGER RELEASE Left 06/28/2016   Procedure: RELEASE TRIGGER FINGER LEFT 3RD FINGER;  Surgeon: [name], MD;  Location: [place];  Service: Orthopedics;  Laterality: Left;   TRIGGER FINGER RELEASE Right 06/12/2016   Procedure: RIGHT INDEX FINGER TRIGGER RELEASE;  Surgeon: [name], MD;  Location: [place];  Service: Orthopedics;  Laterality: Right;   TRIGGER FINGER RELEASE Right 01/19/2018   Procedure: RIGHT RING FINGER TRIGGER FINGER RELEASE;  Surgeon: [name], MD;  Location: [place];  Service: Orthopedics;  Laterality: Right;   TRIGGER FINGER RELEASE Left 05/07/2019   Procedure: RELEASE TRIGGER FINGER LEFT INDEX FINGER;  Surgeon: [name], MD;  Location: [place];  Service: Orthopedics;  Laterality: Left;   UPPER GI ENDOSCOPY N/A 05/08/2020   Procedure: UPPER GI ENDOSCOPY;  Surgeon: Kieth Brightly, Arta Bruce, MD;  [facility];  Service: General;  Laterality: N/A;      Current Outpatient Medications:    ACCU-CHEK AVIVA PLUS test strip, 1 EACH BY OTHER ROUTE 3 (THREE) TIMES DAILY. TEST 3 TIMES DAILY, Disp: 300 strip, Rfl: 3   acetaminophen (TYLENOL) 500 MG tablet, Take 1,000 mg by mouth 2 (two) times daily., Disp: , Rfl:    albuterol (VENTOLIN HFA) 108 (90 Base) MCG/ACT inhaler, TAKE 2 PUFFS BY MOUTH EVERY 6 HOURS AS NEEDED FOR WHEEZE OR SHORTNESS OF BREATH Strength: 108 (90 Base) MCG/ACT (Patient taking differently: Inhale 1-2 puffs into the lungs every 6 (six) hours as needed. TAKE 2 PUFFS BY MOUTH EVERY 6 HOURS AS NEEDED FOR WHEEZE OR SHORTNESS OF BREATH Strength: 108 (90 Base) MCG/ACT), Disp: 8.5 each, Rfl: 3   ammonium lactate (LAC-HYDRIN) 12 % lotion, For dry skin (Patient taking differently: Apply  1 Application topically 2 (two) times daily. For dry skin), Disp: 400 g, Rfl: 0   atorvastatin (LIPITOR) 40 MG tablet, TAKE ONE TABLET BY MOUTH EVERY DAY (Patient taking differently: Take 40 mg by mouth daily.), Disp: 180 tablet, Rfl: 0   baclofen (LIORESAL) 10 MG tablet, Take 1.5 tablets (15 mg total) by mouth 3 (three)  times daily., Disp: 135 tablet, Rfl: 2   busPIRone (BUSPAR) 10 MG tablet, TAKE ONE TABLET BY MOUTH THREE TIMES DAILY, Disp: 540 tablet, Rfl: 1   calcium carbonate (OSCAL) 1500 (600 Ca) MG TABS tablet, Take 1,500 mg by mouth 2 (two) times daily., Disp: , Rfl:    diclofenac Sodium (VOLTAREN) 1 % GEL, APPLY 4grmas TO AFFECTED AREA four times daily AS DIRECTED (Patient taking differently: Apply 4 g topically 4 (four) times daily. APPLY 4grmas TO AFFECTED AREA four times daily AS DIRECTED), Disp: 500 g, Rfl: 1   diltiazem (CARDIZEM CD) 120 MG 24 hr capsule, TAKE 1 CAPSULE BY MOUTH 2 TIMES DAILY. (Patient taking differently: Take 120 mg by mouth in the morning and at bedtime.), Disp: 180 capsule, Rfl: 2   fluticasone (FLONASE) 50 MCG/ACT nasal spray, INSTILL TWO SPRAYS INTO BOTH NOSTRILS TWICE DAILY (Patient taking differently: Place 2 sprays into both nostrils 2 (two) times daily. INSTILL TWO SPRAYS INTO BOTH NOSTRILS TWICE DAILY), Disp: 16 g, Rfl: 29   Fluticasone-Umeclidin-Vilant (TRELEGY ELLIPTA) 100-62.5-25 MCG/ACT AEPB, Inhale 1 puff into the lungs daily. (Patient taking differently: Inhale 1 puff into the lungs daily.), Disp: 60 each, Rfl: 3   gabapentin (NEURONTIN) 300 MG capsule, TAKE TWO CAPSULES BY MOUTH IN THE MORNING AND AT LUNCH AND TAKE THREE CAPSULES AT NIGHT (Patient taking differently: Take by mouth 3 (three) times daily. TAKE TWO CAPSULES BY MOUTH IN THE MORNING AND AT LUNCH AND TAKE THREE CAPSULES AT NIGHT), Disp: 30 capsule, Rfl: 0   hydrOXYzine (ATARAX) 10 MG tablet, TAKE ONE TABLET BY MOUTH EVERY DAY, Disp: 180 tablet, Rfl: 1   ketoconazole (NIZORAL) 2 % cream, Apply 1  Application topically daily., Disp: 15 g, Rfl: 0   linaclotide (LINZESS) 72 MCG capsule, Take 72 mcg by mouth daily before breakfast., Disp: , Rfl:    loratadine (CLARITIN) 10 MG tablet, Take 1 tablet (10 mg total) by mouth daily., Disp: 30 tablet, Rfl: 11   metolazone (ZAROXOLYN) 2.5 MG tablet, Take 1 tablet (2.5 mg total) by mouth 2 (two) times a week. Mondays and Thursdays (Patient taking differently: Take 2.5 mg by mouth 2 (two) times a week. Mondays and Thursdays), Disp: 30 tablet, Rfl: 0   montelukast (SINGULAIR) 10 MG tablet, TAKE 1 TABLET BY MOUTH EVERYDAY AT BEDTIME Strength: 10 mg (Patient taking differently: Take 10 mg by mouth at bedtime. TAKE 1 TABLET BY MOUTH EVERYDAY AT BEDTIME Strength: 10 mg), Disp: 90 tablet, Rfl: 3   Multiple Vitamins-Minerals (BARIATRIC MULTIVITAMINS/IRON) CAPS, Take 1 capsule by mouth 2 (two) times daily., Disp: , Rfl:    NARCAN 4 MG/0.1ML LIQD nasal spray kit, Place 0.4 mg into the nose once as needed (accidental overdose)., Disp: , Rfl:    nystatin (MYCOSTATIN) 100000 UNIT/ML suspension, Take 5 mLs (500,000 Units total) by mouth 4 (four) times daily. Swish and swallow. (Patient taking differently: Take 5 mLs by mouth 4 (four) times daily as needed. Swish and swallow.), Disp: 60 mL, Rfl: 3   omeprazole (PRILOSEC) 20 MG capsule, Take 1 capsule (20 mg total) by mouth daily. (Patient taking differently: Take 20 mg by mouth daily.), Disp: 30 capsule, Rfl: 2   potassium chloride SA (KLOR-CON M) 20 MEQ tablet, Take 20 mEq by mouth daily., Disp: , Rfl:    venlafaxine XR (EFFEXOR-XR) 75 MG 24 hr capsule, Take 1 capsule (75 mg total) by mouth daily with breakfast., Disp: 90 capsule, Rfl: 2   Objective:   Vitals:   12/04/21 8115  BP: (!) 168/96  Pulse: 97  SpO2: 93%    Physical Exam Vitals and nursing note reviewed.  Constitutional:      Appearance: Normal appearance.  HENT:     Head: Normocephalic and atraumatic.  Cardiovascular:     Rate and Rhythm: Normal  rate.     Pulses: Normal pulses.  Pulmonary:     Effort: Pulmonary effort is normal.  Abdominal:     General: There is no distension.     Palpations: Abdomen is soft.     Tenderness: There is no abdominal tenderness. There is no guarding.  Musculoskeletal:        General: No swelling or deformity.  Skin:    General: Skin is warm.     Capillary Refill: Capillary refill takes less than 2 seconds.     Coloration: Skin is not jaundiced.     Findings: Rash present. No bruising.  Neurological:     Mental Status: She is alert and oriented to person, place, and time.  Psychiatric:        Mood and Affect: Mood normal.        Behavior: Behavior normal.        Thought Content: Thought content normal.     Assessment & Plan:  Status post lumbar laminectomy  Gout of shoulder region, unspecified cause, unspecified chronicity, unspecified laterality  Chronic kidney disease (CKD), stage IV (severe) ([facility])  Panniculitis  Essential hypertension  Obstructive sleep apnea treated with BiPAP  Chronic viral hepatitis B without delta agent and without coma ([place])  The patient has multiple medical conditions that would contribute to very slow and poor healing after surgery.  She also has a history of not healing well.  She will have to be 3 months tobacco free, hemoglobin A1c less than 6 and down to her desired weight less than 200 pounds in order to do the surgery.  Would benefit from physical therapy and is willing to do it.  I will put the order in.  We are happy to see her back when she has completed the above tasks.  Pictures were obtained of the patient and placed in the chart with the patient's or guardian's permission.   [place], DO

## 2021-12-05 ENCOUNTER — Other Ambulatory Visit: Payer: Self-pay | Admitting: Family Medicine

## 2021-12-05 ENCOUNTER — Ambulatory Visit: Payer: 59 | Admitting: Pulmonary Disease

## 2021-12-05 LAB — HIV ANTIBODY (ROUTINE TESTING W REFLEX): HIV Screen 4th Generation wRfx: NONREACTIVE

## 2021-12-05 LAB — RPR: RPR Ser Ql: NONREACTIVE

## 2021-12-07 ENCOUNTER — Ambulatory Visit: Payer: 59 | Attending: Plastic Surgery

## 2021-12-07 ENCOUNTER — Other Ambulatory Visit: Payer: Self-pay

## 2021-12-07 DIAGNOSIS — G8929 Other chronic pain: Secondary | ICD-10-CM | POA: Diagnosis present

## 2021-12-07 DIAGNOSIS — M793 Panniculitis, unspecified: Secondary | ICD-10-CM | POA: Insufficient documentation

## 2021-12-07 DIAGNOSIS — M545 Low back pain, unspecified: Secondary | ICD-10-CM | POA: Insufficient documentation

## 2021-12-07 DIAGNOSIS — R262 Difficulty in walking, not elsewhere classified: Secondary | ICD-10-CM | POA: Diagnosis not present

## 2021-12-07 DIAGNOSIS — Z9889 Other specified postprocedural states: Secondary | ICD-10-CM | POA: Insufficient documentation

## 2021-12-07 DIAGNOSIS — R293 Abnormal posture: Secondary | ICD-10-CM | POA: Insufficient documentation

## 2021-12-07 DIAGNOSIS — M109 Gout, unspecified: Secondary | ICD-10-CM | POA: Insufficient documentation

## 2021-12-07 DIAGNOSIS — R269 Unspecified abnormalities of gait and mobility: Secondary | ICD-10-CM | POA: Insufficient documentation

## 2021-12-07 DIAGNOSIS — R2689 Other abnormalities of gait and mobility: Secondary | ICD-10-CM | POA: Insufficient documentation

## 2021-12-07 NOTE — Patient Instructions (Signed)
Aquatic Therapy at [facility]-  What to Expect!  Where:   [facility] [address] [facility] [place], [place] [address] Rehab phone [phone]  NOTE:  You will receive an automated phone message reminding you of your appt and it will say the appointment is at the [address] [facility] [address] [facility] clinic.          How to Prepare: Please make sure you drink 8 ounces of water about one hour prior to your pool session A caregiver may attend if needed with the patient to help assist as needed. A caregiver can sit in the pool room on chair. Please arrive IN YOUR SUIT and 15 minutes prior to your appointment - this helps to avoid delays in starting your session. Please make sure to attend to any toileting needs prior to entering the pool Locker rooms for changing are provided.   There is direct access to the pool deck form the locker room.  You can lock your belongings in a locker with lock provided. Once on the pool deck your therapist will ask if you have signed the Patient  Consent and Assignment of Benefits form before beginning treatment Your therapist may take your blood pressure prior to, during and after your session if indicated We usually try and create a home exercise program based on activities we do in the pool.  Please be thinking about who might be able to assist you in the pool should you need to participate in an aquatic home exercise program at the time of discharge if you need assistance.  Some patients do not want to or do not have the ability to participate in an aquatic home program - this is not a barrier in any way to you participating in aquatic therapy as part of your current therapy plan! After Discharge from PT, you can continue using home program at  the [facility]/, there is a drop-in fee for $5 ($45 a month)or for 60 years  or older $4.00 ($40 a month for seniors ) or any local [facility] pool.  Memberships for purchase are  available for gym/pool at [facility]  IT IS VERY IMPORTANT THAT YOUR LAST VISIT BE IN THE CLINIC AT [address] AFTER YOUR LAST AQUATIC VISIT.  PLEASE MAKE SURE THAT YOU HAVE A [address]  APPOINTMENT SCHEDULED.   About the pool: Pool is located approximately 500 FT from the entrance of the building.  Please bring a support person if you need assistance traveling this      distance.   Your therapist will assist you in entering the water; there are two ways to           enter: stairs with railings, and a mechanical lift. Your therapist will determine the most appropriate way for you.  Water temperature is usually between 88-90 degrees  There may be up to 2 other swimmers in the pool at the same time  The pool deck is tile, please wear shoes with good traction if you prefer not to be barefoot.    Contact Info:  For appointment scheduling and cancellations:         Please call the [facility]  PH:[phone]              Aquatic Therapy  Outpatient Rehabilitation @ [facility]       All sessions are 45 minutes                                                    

## 2021-12-07 NOTE — Therapy (Signed)
OUTPATIENT PHYSICAL THERAPY THORACOLUMBAR EVALUATION   Patient Name: [name] MRN: [id number] DOB:23-Apr-1965, 56 y.o., female Today's Date: 12/07/2021   PT End of Session - 12/07/21 0843     Visit Number 1    Number of Visits 6    Date for PT Re-Evaluation 02/01/22    Authorization Type UHC MCR    PT Start Time 0745    PT Stop Time 0830    PT Time Calculation (min) 45 min    Activity Tolerance Patient tolerated treatment well    Behavior During Therapy [facility] for tasks assessed/performed             Past Medical History:  Diagnosis Date   Alcoholism in remission ([place])    11-26-2021  pt stated no alcohol few yrs ago   Asthma    Bipolar disorder ([place])    Chronic diastolic CHF (congestive heart failure) ([place]) 2014   followed by dr b. Harrell Gave;   Chronic hypoxemic respiratory failure [facility])    pulmonologist--- dr Valeta Harms;   (11-26-2021 per pt was on supplemental oxygen until she 01/ 2022 did not need it,  does not check O2 sats at home anymore.   CKD (chronic kidney disease), stage III [facility])    nephrologist--- dr Carolin Sicks   COPD (chronic obstructive pulmonary disease) ([place])    11-26-2021 pt trying to quit smoking ,  started age 69   Depression    Diabetes mellitus type 2, diet-controlled ([place])    followed by pcp   (11-26-2021  per pt last A1c by pcp on 10-30-2021 7.8,  stated does not check blood sugar)   Dyspnea    Fibrocystic breast disease    GAD (generalized anxiety disorder)    GERD (gastroesophageal reflux disease)    Hepatitis B surface antigen positive 11/2017   pt stated asymptomatic ,  no treatment   History of cervical dysplasia 04/10/2011   '93- once dx.-got pregnant-no intervention, then postpartum, no dysplasia found   History of condyloma acuminatum 04/09/2011   Hyperlipidemia    Hypertension    Incomplete left bundle branch block (LBBB)    Lung nodule 11/2017   pulmonologist--- dr Valeta Harms;  RUL and RLL   Muscle spasm of both lower legs     chronic , due to back issues,   OA (osteoarthritis) 04/10/2011   hips, shoulders, back   OSA (obstructive sleep apnea) 2009   study on epic 09-18-2007 mild osa w/ hypoxmia ;   (11-26-2021 per pt last used cpap 01/ 2022 before bariatric surgery 04/ 2022 stated lost wt / and stopped needing supplemental oxygen , did not need anymore)   Polyneuropathy    due to back issues and DM   Polysubstance abuse ([place])    11-26-2021  pt hx cocaine use stated none for yrs but ED visit 08-14-2021  positive UDS,  pt stated it was once only for depression none since   PSVT (paroxysmal supraventricular tachycardia)    followed by cardiology   Spinal stenosis, unspecified spinal region    Urge incontinence of urine    Wears dentures    upper   Past Surgical History:  Procedure Laterality Date   ANAL FISTULECTOMY  04/17/2011   Procedure: FISTULECTOMY ANAL;  Surgeon: Imogene Burn. [name], MD;  Location: [facility];  Service: General;  Laterality: N/A;  Excision of Condyloma Gluteal Cleft    ANAL RECTAL MANOMETRY N/A 03/26/2019   Procedure: ANO RECTAL MANOMETRY;  Surgeon: Thornton Park, MD;  [facility];  Service: Gastroenterology;  Laterality: N/A;   BIOPSY  03/09/2019   Procedure: BIOPSY;  Surgeon: Thornton Park, MD;  [facility];  Service: Gastroenterology;;   BOTOX INJECTION N/A 12/29/2018   Procedure: BOTOX INJECTION(100 UNITS)  WITH CYSTOSCOPY;  Surgeon: Ceasar Mons, MD;  [facility];  Service: Urology;  Laterality: N/A;   BOTOX INJECTION N/A 07/27/2019   Procedure: BOTOX INJECTION WITH CYSTOSCOPY;  Surgeon: Ceasar Mons, MD;  [facility];  Service: Urology;  Laterality: N/A;  ONLY NEEDS 30 MIN   BOTOX INJECTION N/A 05/02/2021   Procedure: BOTOX INJECTION WITH CYSTOSCOPY;  Surgeon: Ceasar Mons, MD;  Location: [facility];  Service: Urology;  Laterality: N/A;  ONLY NEEDS 30 MIN   BOTOX INJECTION N/A 11/28/2021    Procedure: BOTOX INJECTION WITH CYSTOSCOPY, 200 UNITS;  Surgeon: Ceasar Mons, MD;  Location: [facility];  Service: Urology;  Laterality: N/A;  ONLY NEEDS 30 MIN   BREAST EXCISIONAL BIOPSY Right    x 3   BREAST EXCISIONAL BIOPSY Left    12-29-2000  and 11-30-2001 _0  by dr Marlou Starks   CARPAL TUNNEL RELEASE Bilateral    right 08-30-2002 and left 07-04-2003  by dr Louanne Skye @ [facility]   CATARACT EXTRACTION W/ INTRAOCULAR LENS IMPLANT Bilateral 12/2020   CERVICAL FUSION  04/10/2011   x3- cervical fusion with plating and screws-Dr. [name]   COLONOSCOPY WITH PROPOFOL N/A 07/06/2015   Procedure: COLONOSCOPY WITH PROPOFOL;  Surgeon: [name], MD;  [facility];  Service: Endoscopy;  Laterality: N/A;   COLONOSCOPY WITH PROPOFOL N/A 03/09/2019   Procedure: COLONOSCOPY WITH PROPOFOL;  Surgeon: Thornton Park, MD;  [facility];  Service: Gastroenterology;  Laterality: N/A;   FRACTURE SURGERY     little toe left foot   I & D EXTREMITY Left 12/18/2016   Procedure: IRRIGATION AND DEBRIDEMENT LEFT FOOT, CLOSURE;  Surgeon: [name], MD;  Location: [place];  Service: Orthopedics;  Laterality: Left;   INCISION AND DRAINAGE ABSCESS Right 05/03/2002   _1  by dr Marlou Starks;   right breast abscess   INJECTION KNEE Right 10/03/2020   Procedure: KNEE INJECTION;  Surgeon: Jessy Oto, MD;  Location: [place];  Service: Orthopedics;  Laterality: Right;   KNEE ARTHROSCOPY WITH MEDIAL MENISECTOMY Right 03/21/2021   Procedure: RIGHT KNEE ARTHROSCOPY WITH MEDIAL AND PARTIAL LATERAL MENISCECTOMY;  Surgeon: [name], MD;  Location: [place];  Service: Orthopedics;  Laterality: Right;   LAPAROSCOPIC GASTRIC SLEEVE RESECTION N/A 05/08/2020   Procedure: LAPAROSCOPIC GASTRIC SLEEVE RESECTION;  Surgeon: Kieth Brightly Arta Bruce, MD;  [facility];  Service: General;  Laterality: N/A;   [place] SURGERY  04/10/2011   x5-Lumbar fusion-retained hardware.(Dr. Louanne Skye)    LUMBAR LAMINECTOMY N/A 10/03/2020   Procedure: THORACOLUMBAR LAMINECTOMIES THORACIC ELEVEN-TWELVE, THORACIC TWELVE-LUMBAR ONE  AND LUMBAT ONE-TWO;  Surgeon: Jessy Oto, MD;  Location: [place];  Service: Orthopedics;  Laterality: N/A;   LUMBAR PERCUTANEOUS PEDICLE SCREW 2 LEVEL  06/29/2021   Procedure: LUMBAR PERCUTANEOUS PEDICLE SCREW LUMBAR ONE-TWO;  Surgeon: Earnie Larsson, MD;  Location: [place];  Service: Neurosurgery;;   MULTIPLE TOOTH EXTRACTIONS     POLYPECTOMY  03/09/2019   Procedure: POLYPECTOMY;  Surgeon: Thornton Park, MD;  [facility];  Service: Gastroenterology;;   POSTERIOR LUMBAR FUSION  04/04/2005   _2  by dr Louanne Skye;   L3-- L5   POSTERIOR LUMBAR FUSION  03/04/2006   _3  dr Louanne Skye;   fusion L2--3 and  redo L3--5   SHOULDER ARTHROSCOPY WITH ROTATOR CUFF REPAIR AND SUBACROMIAL DECOMPRESSION Left 05/07/2019   Procedure: LEFT SHOULDER ARTHROSCOPY WITH DEBRIDEMENT, DISTAL CLAVICLE EXCISION,  SUBACROMIAL DECOMPRESSION AND POSSIBLE ROTATOR CUFF REPAIR;  Surgeon: [name], MD;  Location: [place];  Service: Orthopedics;  Laterality: Left;   TEE WITHOUT CARDIOVERSION N/A 04/06/2012   Procedure: TRANSESOPHAGEAL ECHOCARDIOGRAM (TEE);  Surgeon: [name], MD;  Location: [facility];  Service: Cardiovascular;  Laterality: N/A;   TRIGGER FINGER RELEASE Right    middle finger   TRIGGER FINGER RELEASE Left 06/28/2016   Procedure: RELEASE TRIGGER FINGER LEFT 3RD FINGER;  Surgeon: [name], MD;  Location: [place];  Service: Orthopedics;  Laterality: Left;   TRIGGER FINGER RELEASE Right 06/12/2016   Procedure: RIGHT INDEX FINGER TRIGGER RELEASE;  Surgeon: [name], MD;  Location: [place];  Service: Orthopedics;  Laterality: Right;   TRIGGER FINGER RELEASE Right 01/19/2018   Procedure: RIGHT RING FINGER TRIGGER FINGER RELEASE;  Surgeon: [name], MD;  Location: [place];  Service: Orthopedics;  Laterality: Right;   TRIGGER FINGER RELEASE Left 05/07/2019   Procedure:  RELEASE TRIGGER FINGER LEFT INDEX FINGER;  Surgeon: [name], MD;  Location: [place];  Service: Orthopedics;  Laterality: Left;   UPPER GI ENDOSCOPY N/A 05/08/2020   Procedure: UPPER GI ENDOSCOPY;  Surgeon: Kieth Brightly Arta Bruce, MD;  [facility];  Service: General;  Laterality: N/A;   Patient Active Problem List   Diagnosis Date Noted   Panniculitis 12/04/2021   Infection of lip 08/27/2021   Lumbar stenosis with neurogenic claudication 06/29/2021   Lumbar radiculopathy 06/29/2021   Biceps tendinopathy of right upper extremity 05/03/2021   Irregular heartbeat 04/11/2021   Neuropathic pain of foot 03/28/2021   Chronic congestion of paranasal sinus 03/28/2021   Impingement syndrome of right shoulder 03/08/2021   Arthritis of right acromioclavicular joint 03/08/2021   Acute medial meniscus tear of right knee 03/08/2021   Acute lateral meniscus tear of right knee 03/08/2021   Spinal stenosis of thoracic region with radiculopathy    Bilateral leg numbness    Spinal stenosis of lumbar region with neurogenic claudication    Primary osteoarthritis of right knee    Impaired ambulation 01/10/2021   Status post lumbar laminectomy 10/03/2020   Cubital tunnel syndrome on left 05/02/2020   Vaginal discharge 01/20/2020   Prolapse of anterior vaginal wall 11/10/2019   Skin ulcer of left thigh, limited to breakdown of skin ([place]) 09/09/2019   Ulnar nerve compression 08/06/2019   Diabetic neuropathy associated with type 2 diabetes mellitus ([place]) 08/06/2019   Body mass index 50.0-59.9, adult ([place]) 06/12/2019   Impingement syndrome of left shoulder 05/07/2019   Polyp of colon    Polyp of ascending colon    Rectal discomfort 02/12/2019   Osteoarthritis of right hip 02/12/2019   Nontraumatic tear of left supraspinatus tendon 12/30/2018   Gout 12/17/2018   Nontraumatic complete tear of right rotator cuff 12/08/2018   Rotator cuff tear, right 12/01/2018   Tobacco abuse 08/21/2018   Right  groin pain 07/20/2018   Depressed mood 07/13/2018   Paroxysmal SVT (supraventricular tachycardia) 03/20/2018   Pure hypercholesterolemia 03/20/2018   Muscle cramping 02/25/2018   Nodule of upper lobe of right lung 02/13/2018   Falls, subsequent encounter 01/30/2018   Pain in left foot 11/04/2017   Essential hypertension 10/27/2017   Solitary pulmonary nodule 10/07/2017   Restrictive lung disease secondary to obesity 10/01/2017   Polycythemia, secondary 10/01/2017  Chronic viral hepatitis B without delta-agent ([place]) 07/08/2017   COPD (chronic obstructive pulmonary disease) with chronic bronchitis 06/27/2017   Chronic kidney disease (CKD), stage IV (severe) ([place]) 01/14/2017   Decubitus ulcer 12/13/2016   Type 2 diabetes mellitus with stage 3 chronic kidney disease, with long-term current use of insulin ([place]) 12/12/2016   Urge incontinence of urine 12/05/2016   Trigger finger, left index finger 07/15/2016   Back pain 04/07/2014   Morbid obesity ([place]) 10/02/2012   Chronic diastolic CHF (congestive heart failure) ([place]) 04/07/2012   Skin lesion of scalp 03/15/2011   GERD 01/26/2010   Obstructive sleep apnea treated with BiPAP 02/03/2008   FIBROCYSTIC BREAST DISEASE 10/28/2006   Hyperlipidemia 03/27/2006   Obesity hypoventilation syndrome ([place]) 03/27/2006   Depression with anxiety 03/27/2006    PCP: Alen Bleacher, MD   REFERRING PROVIDER: Wallace Going, [phone] (ICD-10-CM) - Status post lumbar laminectomy M10.9 (ICD-10-CM) - Gout of shoulder region, unspecified cause, unspecified chronicity, unspecified laterality M79.3 (ICD-10-CM) - Panniculitis  REFERRING DIAG: chronic low back  Rationale for Evaluation and Treatment: Rehabilitation  THERAPY DIAG:  Chronic bilateral low back pain without sciatica  ONSET DATE: chronic  SUBJECTIVE:                                                                                                                                                                                            SUBJECTIVE STATEMENT: History of chronic low back pain and 6 prior surgeries, recent ESI 3 weeks ago w/o improvement.  Referred by Dr. Marla Roe  to undergo physical therapy to allow her proceed with tissue removal.  PERTINENT HISTORY:  The patient has multiple medical conditions that would contribute to very slow and poor healing after surgery. She also has a history of not healing well. She will have to be 3 months tobacco free, hemoglobin A1c less than 6 and down to her desired weight less than 200 pounds in order to do the surgery. Would benefit from physical therapy and is willing to do it. I will put the order in. We are happy to see her back when she has completed the above tasks. Had back surgery on 6/2.  Wound dehissed.  No nurse visit or PT post surgery.  Asks for aid, Needs help with bath.  She would like PT and can go to outpatient.  Had previous good experience with [facility].   PAIN:  Are you having pain? Yes: NPRS scale: 10/10 Pain location: B low back Pain description: ache Aggravating factors: movement Relieving factors: rest  PRECAUTIONS: None  WEIGHT BEARING RESTRICTIONS: No  FALLS:  Has patient fallen  in last 6 months? No  LIVING ENVIRONMENT: Lives with: lives with their family    OCCUPATION: disabled  PLOF: Independent with basic ADLs  PATIENT GOALS: To undergo tissue reduction surgery to relieve back pain  NEXT MD VISIT: when weight goals met to alloe her to proceed with surgery  OBJECTIVE:   DIAGNOSTIC FINDINGS:  IMPRESSION: 1. Left foraminal stenosis C2-3 secondary to uncovertebral and facet DJD. Mild disc bulge. 2. Solid-appearing fusion C3-T2 as detailed above. 3. Large extrusion T2-3 extending inferiorly, with anterior cord flattening. 4. Smaller multilevel disc protrusions T3-8, largest at T6-7 with anterior cord flattening. 5. Moderate to severe spinal stenosis L1-2 despite  previous posterior decompression. 6. Solid-appearing fusion L2-L5.  PATIENT SURVEYS:  Modified Oswestry 41/50   SCREENING FOR RED FLAGS: negative  COGNITION: Overall cognitive status: Within functional limits for tasks assessed     SENSATION: Not tested  MUSCLE LENGTH: Hamstrings: Right 90 deg; Left 90 deg(measured in sitting) Thomas test:   POSTURE: rounded shoulders, forward head, decreased lumbar lordosis, increased thoracic kyphosis, flexed trunk , and R lateral shift  PALPATION: Not tested  LUMBAR ROM:   AROM eval  Flexion 33%  Extension 10%  Right lateral flexion   Left lateral flexion   Right rotation 25%  Left rotation 25%   (Blank rows = not tested)  LOWER EXTREMITY ROM:     Passive  Right eval Left eval  Hip flexion 120d 120d  Hip extension    Hip abduction    Hip adduction    Hip internal rotation    Hip external rotation    Knee flexion [facility] WFL  Knee extension 0d 0d  Ankle dorsiflexion    Ankle plantarflexion    Ankle inversion    Ankle eversion     (Blank rows = not tested)  LOWER EXTREMITY MMT:    MMT Right eval Left eval  Hip flexion 4 4  Hip extension 4 4  Hip abduction    Hip adduction    Hip internal rotation    Hip external rotation    Knee flexion 4 4  Knee extension 4 4  Ankle dorsiflexion    Ankle plantarflexion 4 4  Ankle inversion    Ankle eversion    Core 3 3   (Blank rows = not tested)  LUMBAR SPECIAL TESTS:  Straight leg raise test: Negative and Slump test: Negative  FUNCTIONAL TESTS:  5 times sit to stand: 23s with UE support  GAIT: Distance walked: 75 ft x2 Assistive device utilized: Single point cane Level of assistance: Modified independence Comments: antalgic gait pattern  TODAY'S TREATMENT:                                                                                                                              DATE: 12/07/21    PATIENT EDUCATION:  Education details: Discussed eval findings,  rehab rationale and POC and patient is in agreement  Person educated: Patient  Education method: Explanation Education comprehension: verbalized understanding and needs further education  HOME EXERCISE PROGRAM: Access Code: [email or web address] URL: [email or web address] Date: 12/07/2021 Prepared by: Sharlynn Oliphant  Exercises - Sit to Stand with Armchair  - 2 x daily - 5 x weekly - 1 sets - 5 reps - Sit to Stand  - 2 x daily - 5 x weekly - 1 sets - 5 reps - Sit to Stand Without Arm Support  - 2 x daily - 5 x weekly - 1 sets - 10 reps - 30s hold - Standing Heel Raise with Support  - 2 x daily - 5 x weekly - 1 sets - 10 reps  ASSESSMENT:  CLINICAL IMPRESSION: Patient is a 56 y.o. female who was seen today for physical therapy evaluation and treatment for chronic low back pain prior to undergoing abdominal tissue removal surgery to help alleviate mobility limitations.  Patient demonstrates decreased lumbar mobility, mild strength deficits in LEs.  Neural tension signs did not elicit symptoms.  Patient is a good candidate to begin aquatic based therapy to asist with mobility deficits.  OBJECTIVE IMPAIRMENTS: Abnormal gait, decreased activity tolerance, decreased balance, decreased endurance, decreased knowledge of condition, decreased mobility, difficulty walking, decreased ROM, decreased strength, increased fascial restrictions, postural dysfunction, obesity, and pain.   ACTIVITY LIMITATIONS: carrying, lifting, bending, sitting, standing, squatting, sleeping, stairs, transfers, and bed mobility  PERSONAL FACTORS: Age, Behavior pattern, Fitness, Past/current experiences, Time since onset of injury/illness/exacerbation, and 6 prior back surgeries are also affecting patient's functional outcome.   REHAB POTENTIAL: Fair based on chronicity, prior surgical interventions and limited response to treatments to date.  CLINICAL DECISION MAKING: Evolving/moderate complexity  EVALUATION COMPLEXITY:  Moderate   GOALS: Goals reviewed with patient? No  SHORT TERM GOALS: Target date: 12/21/2021  Patient to demonstrate independence in HEP  Baseline: [id number] Goal status: INITIAL   LONG TERM GOALS: Target date: 01/18/2022  Decrease 5x STS with UE support time to 20s Baseline: 23s Goal status: INITIAL  2.  Decrease ODI score to 33/50 for MCID Baseline: 41 Goal status: INITIAL  3.  Increase lumbar flexion ROM to 50% Baseline: 33% Goal status: INITIAL  4. Increase core strength to 3+/5  Baseline: 3/5  Goal Status: INITIAL   PLAN:  PT FREQUENCY: 1x/week  PT DURATION: 6 weeks  PLANNED INTERVENTIONS: Therapeutic exercises, Therapeutic activity, Neuromuscular re-education, Balance training, Gait training, Patient/Family education, Self Care, Joint mobilization, Stair training, Aquatic Therapy, Manual therapy, and Re-evaluation.  PLAN FOR NEXT SESSION: Aquatic therapy, core strength, LE strength, flexibility tasks, HEP review and update   Lanice Shirts, PT 12/07/2021, 8:44 AM

## 2021-12-10 LAB — CYTOLOGY - PAP
Chlamydia: NEGATIVE
Comment: NEGATIVE
Comment: NEGATIVE
Comment: NEGATIVE
Comment: NORMAL
Diagnosis: UNDETERMINED — AB
High risk HPV: NEGATIVE
Neisseria Gonorrhea: NEGATIVE
Trichomonas: POSITIVE — AB

## 2021-12-11 ENCOUNTER — Encounter: Payer: Self-pay | Admitting: Student

## 2021-12-11 DIAGNOSIS — A599 Trichomoniasis, unspecified: Secondary | ICD-10-CM

## 2021-12-12 ENCOUNTER — Ambulatory Visit: Payer: 59 | Admitting: Podiatry

## 2021-12-12 MED ORDER — METRONIDAZOLE 500 MG PO TABS: 500.0000 mg | ORAL_TABLET | Freq: Two times a day (BID) | ORAL | 0 refills | Status: AC

## 2021-12-12 NOTE — Telephone Encounter (Signed)
Called patient to discuss the results of her recent labs.  Informed patient that her Pap smear came back with abnormal squamous cells and negative HPV.  Advised she would have a follow up testing in 3 years.  Also inform patient that her STD testing came back positive for trichomonas and I will be sending her metronidazole 500 mg which she will take twice daily for 7 days.  Patient verbalized understanding and agreeable to plan.  I have an appointment with her scheduled for next week and she reports to follow-up with any additional questions at that time.

## 2021-12-18 ENCOUNTER — Ambulatory Visit: Payer: 59 | Admitting: Student

## 2021-12-19 ENCOUNTER — Ambulatory Visit: Payer: 59 | Admitting: Physical Therapy

## 2021-12-19 NOTE — Therapy (Incomplete)
OUTPATIENT PHYSICAL THERAPY TREATMENT NOTE   Patient Name: [name] MRN: [id number] DOB:11/08/1965, 56 y.o., female Today's Date: 12/19/2021  PCP: *** REFERRING PROVIDER: ***  END OF SESSION:    Past Medical History:  Diagnosis Date   Alcoholism in remission ([place])    11-26-2021  pt stated no alcohol few yrs ago   Asthma    Bipolar disorder ([place])    Chronic diastolic CHF (congestive heart failure) ([place]) 2014   followed by dr b. Harrell Gave;   Chronic hypoxemic respiratory failure [facility])    pulmonologist--- dr Valeta Harms;   (11-26-2021 per pt was on supplemental oxygen until she 01/ 2022 did not need it,  does not check O2 sats at home anymore.   CKD (chronic kidney disease), stage III [facility])    nephrologist--- dr Carolin Sicks   COPD (chronic obstructive pulmonary disease) ([place])    11-26-2021 pt trying to quit smoking ,  started age 59   Depression    Diabetes mellitus type 2, diet-controlled ([place])    followed by pcp   (11-26-2021  per pt last A1c by pcp on 10-30-2021 7.8,  stated does not check blood sugar)   Dyspnea    Fibrocystic breast disease    GAD (generalized anxiety disorder)    GERD (gastroesophageal reflux disease)    Hepatitis B surface antigen positive 11/2017   pt stated asymptomatic ,  no treatment   History of cervical dysplasia 04/10/2011   '93- once dx.-got pregnant-no intervention, then postpartum, no dysplasia found   History of condyloma acuminatum 04/09/2011   Hyperlipidemia    Hypertension    Incomplete left bundle branch block (LBBB)    Lung nodule 11/2017   pulmonologist--- dr Valeta Harms;  RUL and RLL   Muscle spasm of both lower legs    chronic , due to back issues,   OA (osteoarthritis) 04/10/2011   hips, shoulders, back   OSA (obstructive sleep apnea) 2009   study on epic 09-18-2007 mild osa w/ hypoxmia ;   (11-26-2021 per pt last used cpap 01/ 2022 before bariatric surgery 04/ 2022 stated lost wt / and stopped needing supplemental oxygen , did not  need anymore)   Polyneuropathy    due to back issues and DM   Polysubstance abuse ([place])    11-26-2021  pt hx cocaine use stated none for yrs but ED visit 08-14-2021  positive UDS,  pt stated it was once only for depression none since   PSVT (paroxysmal supraventricular tachycardia)    followed by cardiology   Spinal stenosis, unspecified spinal region    Urge incontinence of urine    Wears dentures    upper   Past Surgical History:  Procedure Laterality Date   ANAL FISTULECTOMY  04/17/2011   Procedure: FISTULECTOMY ANAL;  Surgeon: Imogene Burn. [name], MD;  [facility]: [facility];  Service: General;  Laterality: N/A;  Excision of Condyloma Gluteal Cleft    ANAL RECTAL MANOMETRY N/A 03/26/2019   Procedure: ANO RECTAL MANOMETRY;  Surgeon: Thornton Park, MD;  [facility];  Service: Gastroenterology;  Laterality: N/A;   BIOPSY  03/09/2019   Procedure: BIOPSY;  Surgeon: Thornton Park, MD;  [facility];  Service: Gastroenterology;;   BOTOX INJECTION N/A 12/29/2018   Procedure: BOTOX INJECTION(100 UNITS)  WITH CYSTOSCOPY;  Surgeon: Ceasar Mons, MD;  [facility];  Service: Urology;  Laterality: N/A;   BOTOX INJECTION N/A 07/27/2019   Procedure: BOTOX INJECTION WITH CYSTOSCOPY;  Surgeon: Ceasar Mons, MD;  Location: [facility];  Service: Urology;  Laterality: N/A;  ONLY NEEDS 30 MIN   BOTOX INJECTION N/A 05/02/2021   Procedure: BOTOX INJECTION WITH CYSTOSCOPY;  Surgeon: Ceasar Mons, MD;  [facility];  Service: Urology;  Laterality: N/A;  ONLY NEEDS 30 MIN   BOTOX INJECTION N/A 11/28/2021   Procedure: BOTOX INJECTION WITH CYSTOSCOPY, 200 UNITS;  Surgeon: Ceasar Mons, MD;  Location: [facility];  Service: Urology;  Laterality: N/A;  ONLY NEEDS 30 MIN   BREAST EXCISIONAL BIOPSY Right    x 3   BREAST EXCISIONAL BIOPSY Left    12-29-2000  and 11-30-2001 [facility]  by dr Marlou Starks   CARPAL TUNNEL  RELEASE Bilateral    right 08-30-2002 and left 07-04-2003  by dr Louanne Skye @ [facility]   CATARACT EXTRACTION W/ INTRAOCULAR LENS IMPLANT Bilateral 12/2020   CERVICAL FUSION  04/10/2011   x3- cervical fusion with plating and screws-Dr. [name]   COLONOSCOPY WITH PROPOFOL N/A 07/06/2015   Procedure: COLONOSCOPY WITH PROPOFOL;  Surgeon: [name], MD;  [facility];  Service: Endoscopy;  Laterality: N/A;   COLONOSCOPY WITH PROPOFOL N/A 03/09/2019   Procedure: COLONOSCOPY WITH PROPOFOL;  Surgeon: Thornton Park, MD;  [facility];  Service: Gastroenterology;  Laterality: N/A;   FRACTURE SURGERY     little toe left foot   I & D EXTREMITY Left 12/18/2016   Procedure: IRRIGATION AND DEBRIDEMENT LEFT FOOT, CLOSURE;  Surgeon: [name], MD;  Location: [place];  Service: Orthopedics;  Laterality: Left;   INCISION AND DRAINAGE ABSCESS Right 05/03/2002   '[facility]  by dr Marlou Starks;   right breast abscess   INJECTION KNEE Right 10/03/2020   Procedure: KNEE INJECTION;  Surgeon: Jessy Oto, MD;  Location: [place];  Service: Orthopedics;  Laterality: Right;   KNEE ARTHROSCOPY WITH MEDIAL MENISECTOMY Right 03/21/2021   Procedure: RIGHT KNEE ARTHROSCOPY WITH MEDIAL AND PARTIAL LATERAL MENISCECTOMY;  Surgeon: [name], MD;  Location: [place];  Service: Orthopedics;  Laterality: Right;   LAPAROSCOPIC GASTRIC SLEEVE RESECTION N/A 05/08/2020   Procedure: LAPAROSCOPIC GASTRIC SLEEVE RESECTION;  Surgeon: Kieth Brightly Arta Bruce, MD;  [facility];  Service: General;  Laterality: N/A;   [place] SURGERY  04/10/2011   x5-Lumbar fusion-retained hardware.(Dr. Louanne Skye)   LUMBAR LAMINECTOMY N/A 10/03/2020   Procedure: THORACOLUMBAR LAMINECTOMIES THORACIC ELEVEN-TWELVE, THORACIC TWELVE-LUMBAR ONE  AND LUMBAT ONE-TWO;  Surgeon: Jessy Oto, MD;  Location: [place];  Service: Orthopedics;  Laterality: N/A;   LUMBAR PERCUTANEOUS PEDICLE SCREW 2 LEVEL  06/29/2021   Procedure: LUMBAR PERCUTANEOUS  PEDICLE SCREW LUMBAR ONE-TWO;  Surgeon: Earnie Larsson, MD;  Location: [place];  Service: Neurosurgery;;   MULTIPLE TOOTH EXTRACTIONS     POLYPECTOMY  03/09/2019   Procedure: POLYPECTOMY;  Surgeon: Thornton Park, MD;  [facility];  Service: Gastroenterology;;   POSTERIOR LUMBAR FUSION  04/04/2005   '[facility]  by dr Louanne Skye;   L3-- L5   POSTERIOR LUMBAR FUSION  03/04/2006   '[facility]  dr Louanne Skye;   fusion L2--3 and redo L3--5   SHOULDER ARTHROSCOPY WITH ROTATOR CUFF REPAIR AND SUBACROMIAL DECOMPRESSION Left 05/07/2019   Procedure: LEFT SHOULDER ARTHROSCOPY WITH DEBRIDEMENT, DISTAL CLAVICLE EXCISION,  SUBACROMIAL DECOMPRESSION AND POSSIBLE ROTATOR CUFF REPAIR;  Surgeon: [name], MD;  Location: [place];  Service: Orthopedics;  Laterality: Left;   TEE WITHOUT CARDIOVERSION N/A 04/06/2012   Procedure: TRANSESOPHAGEAL ECHOCARDIOGRAM (TEE);  Surgeon: [name], MD;  Location: [place];  Service: Cardiovascular;  Laterality: N/A;  TRIGGER FINGER RELEASE Right    middle finger   TRIGGER FINGER RELEASE Left 06/28/2016   Procedure: RELEASE TRIGGER FINGER LEFT 3RD FINGER;  Surgeon: [name], MD;  Location: [place];  Service: Orthopedics;  Laterality: Left;   TRIGGER FINGER RELEASE Right 06/12/2016   Procedure: RIGHT INDEX FINGER TRIGGER RELEASE;  Surgeon: [name], MD;  Location: [place];  Service: Orthopedics;  Laterality: Right;   TRIGGER FINGER RELEASE Right 01/19/2018   Procedure: RIGHT RING FINGER TRIGGER FINGER RELEASE;  Surgeon: [name], MD;  Location: [place];  Service: Orthopedics;  Laterality: Right;   TRIGGER FINGER RELEASE Left 05/07/2019   Procedure: RELEASE TRIGGER FINGER LEFT INDEX FINGER;  Surgeon: [name], MD;  Location: [place];  Service: Orthopedics;  Laterality: Left;   UPPER GI ENDOSCOPY N/A 05/08/2020   Procedure: UPPER GI ENDOSCOPY;  Surgeon: Kieth Brightly Arta Bruce, MD;  [facility];  Service: General;  Laterality: N/A;   Patient Active Problem List    Diagnosis Date Noted   Panniculitis 12/04/2021   Infection of lip 08/27/2021   Lumbar stenosis with neurogenic claudication 06/29/2021   Lumbar radiculopathy 06/29/2021   Biceps tendinopathy of right upper extremity 05/03/2021   Irregular heartbeat 04/11/2021   Neuropathic pain of foot 03/28/2021   Chronic congestion of paranasal sinus 03/28/2021   Impingement syndrome of right shoulder 03/08/2021   Arthritis of right acromioclavicular joint 03/08/2021   Acute medial meniscus tear of right knee 03/08/2021   Acute lateral meniscus tear of right knee 03/08/2021   Spinal stenosis of thoracic region with radiculopathy    Bilateral leg numbness    Spinal stenosis of lumbar region with neurogenic claudication    Primary osteoarthritis of right knee    Impaired ambulation 01/10/2021   Status post lumbar laminectomy 10/03/2020   Cubital tunnel syndrome on left 05/02/2020   Vaginal discharge 01/20/2020   Prolapse of anterior vaginal wall 11/10/2019   Skin ulcer of left thigh, limited to breakdown of skin ([place]) 09/09/2019   Ulnar nerve compression 08/06/2019   Diabetic neuropathy associated with type 2 diabetes mellitus ([place]) 08/06/2019   Body mass index 50.0-59.9, adult ([place]) 06/12/2019   Impingement syndrome of left shoulder 05/07/2019   Polyp of colon    Polyp of ascending colon    Rectal discomfort 02/12/2019   Osteoarthritis of right hip 02/12/2019   Nontraumatic tear of left supraspinatus tendon 12/30/2018   Gout 12/17/2018   Nontraumatic complete tear of right rotator cuff 12/08/2018   Rotator cuff tear, right 12/01/2018   Tobacco abuse 08/21/2018   Right groin pain 07/20/2018   Depressed mood 07/13/2018   Paroxysmal SVT (supraventricular tachycardia) 03/20/2018   Pure hypercholesterolemia 03/20/2018   Muscle cramping 02/25/2018   Nodule of upper lobe of right lung 02/13/2018   Falls, subsequent encounter 01/30/2018   Pain in left foot 11/04/2017   Essential hypertension  10/27/2017   Solitary pulmonary nodule 10/07/2017   Restrictive lung disease secondary to obesity 10/01/2017   Polycythemia, secondary 10/01/2017   Chronic viral hepatitis B without delta-agent ([place]) 07/08/2017   COPD (chronic obstructive pulmonary disease) with chronic bronchitis 06/27/2017   Chronic kidney disease (CKD), stage IV (severe) ([place]) 01/14/2017   Decubitus ulcer 12/13/2016   Type 2 diabetes mellitus with stage 3 chronic kidney disease, with long-term current use of insulin ([place]) 12/12/2016   Urge incontinence of urine 12/05/2016   Trigger finger, left index finger 07/15/2016   Back pain 04/07/2014   Morbid obesity (  [place]) 10/02/2012   Chronic diastolic CHF (congestive heart failure) ([place]) 04/07/2012   Skin lesion of scalp 03/15/2011   GERD 01/26/2010   Obstructive sleep apnea treated with BiPAP 02/03/2008   FIBROCYSTIC BREAST DISEASE 10/28/2006   Hyperlipidemia 03/27/2006   Obesity hypoventilation syndrome ([place]) 03/27/2006   Depression with anxiety 03/27/2006    REFERRING DIAG: Chronic bilateral low back pain without sciatica   THERAPY DIAG:  No diagnosis found.  Rationale for Evaluation and Treatment Rehabilitation  PERTINENT HISTORY: ***  PRECAUTIONS: ***  SUBJECTIVE:                                                                                                                                                                                      SUBJECTIVE STATEMENT:  ***  he patient has multiple medical conditions that would contribute to very slow and poor healing after surgery. She also has a history of not healing well. She will have to be 3 months tobacco free, hemoglobin A1c less than 6 and down to her desired weight less than 200 pounds in order to do the surgery. Would benefit from physical therapy and is willing to do it. I will put the order in. We are happy to see her back when she has completed the above tasks. Had back surgery on 6/2.  Wound dehissed.   No nurse visit or PT post surgery.  Asks for aid, Needs help with bath.  She would like PT and can go to outpatient.  Had previous good experience with [facility].   History of chronic low back pain and 6 prior surgeries, recent ESI 3 weeks ago w/o improvement.  Referred by Dr. Marla Roe  to undergo physical therapy to allow her proceed with tissue removal   PAIN:  Are you having pain? {[id number]}   OBJECTIVE: (objective measures completed at initial evaluation unless otherwise dated)   OBJECTIVE:    DIAGNOSTIC FINDINGS:  IMPRESSION: 1. Left foraminal stenosis C2-3 secondary to uncovertebral and facet DJD. Mild disc bulge. 2. Solid-appearing fusion C3-T2 as detailed above. 3. Large extrusion T2-3 extending inferiorly, with anterior cord flattening. 4. Smaller multilevel disc protrusions T3-8, largest at T6-7 with anterior cord flattening. 5. Moderate to severe spinal stenosis L1-2 despite previous posterior decompression. 6. Solid-appearing fusion L2-L5.   PATIENT SURVEYS:  Modified Oswestry 41/50    SCREENING FOR RED FLAGS: negative   COGNITION: Overall cognitive status: Within functional limits for tasks assessed                          SENSATION: Not tested   MUSCLE LENGTH: Hamstrings: Right 90 deg; Left  90 deg(measured in sitting) Thomas test:    POSTURE: rounded shoulders, forward head, decreased lumbar lordosis, increased thoracic kyphosis, flexed trunk , and R lateral shift   PALPATION: Not tested   LUMBAR ROM:    AROM eval  Flexion 33%  Extension 10%  Right lateral flexion    Left lateral flexion    Right rotation 25%  Left rotation 25%   (Blank rows = not tested)   LOWER EXTREMITY ROM:      Passive  Right eval Left eval  Hip flexion 120d 120d  Hip extension      Hip abduction      Hip adduction      Hip internal rotation      Hip external rotation      Knee flexion [facility] WFL  Knee extension 0d 0d  Ankle dorsiflexion       Ankle plantarflexion      Ankle inversion      Ankle eversion       (Blank rows = not tested)   LOWER EXTREMITY MMT:     MMT Right eval Left eval  Hip flexion 4 4  Hip extension 4 4  Hip abduction      Hip adduction      Hip internal rotation      Hip external rotation      Knee flexion 4 4  Knee extension 4 4  Ankle dorsiflexion      Ankle plantarflexion 4 4  Ankle inversion      Ankle eversion      Core 3 3   (Blank rows = not tested)   LUMBAR SPECIAL TESTS:  Straight leg raise test: Negative and Slump test: Negative   FUNCTIONAL TESTS:  5 times sit to stand: 23s with UE support   GAIT: Distance walked: 75 ft x2 Assistive device utilized: Single point cane Level of assistance: Modified independence Comments: antalgic gait pattern   TODAY'S TREATMENT:                                                                                                                              DATE: 12/07/21      PATIENT EDUCATION:  Education details: Discussed eval findings, rehab rationale and POC and patient is in agreement  Person educated: Patient Education method: Explanation Education comprehension: verbalized understanding and needs further education   HOME EXERCISE PROGRAM: Access Code: [email or web address] URL: [email or web address] Date: 12/07/2021 Prepared by: Sharlynn Oliphant   Exercises - Sit to Stand with Armchair  - 2 x daily - 5 x weekly - 1 sets - 5 reps - Sit to Stand  - 2 x daily - 5 x weekly - 1 sets - 5 reps - Sit to Stand Without Arm Support  - 2 x daily - 5 x weekly - 1 sets - 10 reps - 30s hold - Standing Heel Raise with Support  - 2 x daily - 5 x weekly - 1  sets - 10 reps   ASSESSMENT:   CLINICAL IMPRESSION: Patient is a 56 y.o. female who was seen today for physical therapy evaluation and treatment for chronic low back pain prior to undergoing abdominal tissue removal surgery to help alleviate mobility limitations.  Patient demonstrates  decreased lumbar mobility, mild strength deficits in LEs.  Neural tension signs did not elicit symptoms.  Patient is a good candidate to begin aquatic based therapy to asist with mobility deficits.   OBJECTIVE IMPAIRMENTS: Abnormal gait, decreased activity tolerance, decreased balance, decreased endurance, decreased knowledge of condition, decreased mobility, difficulty walking, decreased ROM, decreased strength, increased fascial restrictions, postural dysfunction, obesity, and pain.    ACTIVITY LIMITATIONS: carrying, lifting, bending, sitting, standing, squatting, sleeping, stairs, transfers, and bed mobility   PERSONAL FACTORS: Age, Behavior pattern, Fitness, Past/current experiences, Time since onset of injury/illness/exacerbation, and 6 prior back surgeries are also affecting patient's functional outcome.    REHAB POTENTIAL: Fair based on chronicity, prior surgical interventions and limited response to treatments to date.   CLINICAL DECISION MAKING: Evolving/moderate complexity   EVALUATION COMPLEXITY: Moderate     GOALS: Goals reviewed with patient? No   SHORT TERM GOALS: Target date: 12/21/2021   Patient to demonstrate independence in HEP  Baseline: [id number] Goal status: INITIAL     LONG TERM GOALS: Target date: 01/18/2022   Decrease 5x STS with UE support time to 20s Baseline: 23s Goal status: INITIAL   2.  Decrease ODI score to 33/50 for MCID Baseline: 41 Goal status: INITIAL   3.  Increase lumbar flexion ROM to 50% Baseline: 33% Goal status: INITIAL   4. Increase core strength to 3+/5            Baseline: 3/5            Goal Status: INITIAL     PLAN:   PT FREQUENCY: 1x/week   PT DURATION: 6 weeks   PLANNED INTERVENTIONS: Therapeutic exercises, Therapeutic activity, Neuromuscular re-education, Balance training, Gait training, Patient/Family education, Self Care, Joint mobilization, Stair training, Aquatic Therapy, Manual therapy, and Re-evaluation.   PLAN  FOR NEXT SESSION: Aquatic therapy, core strength, LE strength, flexibility tasks, HEP review and update       ***

## 2021-12-24 ENCOUNTER — Encounter: Payer: Self-pay | Admitting: Student

## 2021-12-24 ENCOUNTER — Other Ambulatory Visit: Payer: Self-pay

## 2021-12-24 ENCOUNTER — Ambulatory Visit: Payer: 59 | Admitting: Podiatry

## 2021-12-24 MED ORDER — DICLOFENAC SODIUM 1 % EX GEL: CUTANEOUS | 1 refills | Status: DC

## 2021-12-27 NOTE — Therapy (Addendum)
OUTPATIENT PHYSICAL THERAPY TREATMENT NOTE/DC SUMMARY   Patient Name: [name] MRN: [id number] DOB:13-Aug-1965, 56 y.o., female Today's Date: 01/30/2022  PCP: Alen Bleacher, MD   REFERRING PROVIDER: Wallace Going, [id number] (ICD-10-CM) - Status post lumbar laminectomy M10.9 (ICD-10-CM) - Gout of shoulder region, unspecified cause, unspecified chronicity, unspecified laterality M79.3 (ICD-10-CM) - Panniculitis  PHYSICAL THERAPY DISCHARGE SUMMARY  Visits from Start of Care: 2  Current functional level related to goals / functional outcomes: UTA   Remaining deficits: UTA   Education / Equipment: HEP   Patient agrees to discharge. Patient goals were partially met. Patient is being discharged due to not returning since the last visit.  END OF SESSION:     Past Medical History:  Diagnosis Date   Alcoholism in remission ([place])    11-26-2021  pt stated no alcohol few yrs ago   Asthma    Bipolar disorder ([place])    Chronic diastolic CHF (congestive heart failure) ([place]) 2014   followed by dr b. Harrell Gave;   Chronic hypoxemic respiratory failure [facility])    pulmonologist--- dr Valeta Harms;   (11-26-2021 per pt was on supplemental oxygen until she 01/ 2022 did not need it,  does not check O2 sats at home anymore.   CKD (chronic kidney disease), stage III [facility])    nephrologist--- dr Carolin Sicks   COPD (chronic obstructive pulmonary disease) ([place])    11-26-2021 pt trying to quit smoking ,  started age 57   Depression    Diabetes mellitus type 2, diet-controlled ([place])    followed by pcp   (11-26-2021  per pt last A1c by pcp on 10-30-2021 7.8,  stated does not check blood sugar)   Dyspnea    Fibrocystic breast disease    GAD (generalized anxiety disorder)    GERD (gastroesophageal reflux disease)    Hepatitis B surface antigen positive 11/2017   pt stated asymptomatic ,  no treatment   History of cervical dysplasia 04/10/2011   '93- once dx.-got pregnant-no intervention, then  postpartum, no dysplasia found   History of condyloma acuminatum 04/09/2011   Hyperlipidemia    Hypertension    Incomplete left bundle branch block (LBBB)    Lung nodule 11/2017   pulmonologist--- dr Valeta Harms;  RUL and RLL   Muscle spasm of both lower legs    chronic , due to back issues,   OA (osteoarthritis) 04/10/2011   hips, shoulders, back   OSA (obstructive sleep apnea) 2009   study on epic 09-18-2007 mild osa w/ hypoxmia ;   (11-26-2021 per pt last used cpap 01/ 2022 before bariatric surgery 04/ 2022 stated lost wt / and stopped needing supplemental oxygen , did not need anymore)   Polyneuropathy    due to back issues and DM   Polysubstance abuse ([place])    11-26-2021  pt hx cocaine use stated none for yrs but ED visit 08-14-2021  positive UDS,  pt stated it was once only for depression none since   PSVT (paroxysmal supraventricular tachycardia)    followed by cardiology   Spinal stenosis, unspecified spinal region    Urge incontinence of urine    Wears dentures    upper   Past Surgical History:  Procedure Laterality Date   ANAL FISTULECTOMY  04/17/2011   Procedure: FISTULECTOMY ANAL;  Surgeon: Imogene Burn. [name], MD;  Location: [facility];  Service: General;  Laterality: N/A;  Excision of Condyloma Gluteal Cleft    ANAL RECTAL MANOMETRY N/A 03/26/2019   Procedure: ANO RECTAL MANOMETRY;  Surgeon: Thornton Park, MD;  Location: [facility];  Service: Gastroenterology;  Laterality: N/A;   BIOPSY  03/09/2019   Procedure: BIOPSY;  Surgeon: Thornton Park, MD;  [facility]: [facility];  Service: Gastroenterology;;   BOTOX INJECTION N/A 12/29/2018   Procedure: BOTOX INJECTION(100 UNITS)  WITH CYSTOSCOPY;  Surgeon: Ceasar Mons, MD;  [facility];  Service: Urology;  Laterality: N/A;   BOTOX INJECTION N/A 07/27/2019   Procedure: BOTOX INJECTION WITH CYSTOSCOPY;  Surgeon: Ceasar Mons, MD;  [facility];  Service: Urology;  Laterality: N/A;  ONLY NEEDS  30 MIN   BOTOX INJECTION N/A 05/02/2021   Procedure: BOTOX INJECTION WITH CYSTOSCOPY;  Surgeon: Ceasar Mons, MD;  Location: [facility];  Service: Urology;  Laterality: N/A;  ONLY NEEDS 30 MIN   BOTOX INJECTION N/A 11/28/2021   Procedure: BOTOX INJECTION WITH CYSTOSCOPY, 200 UNITS;  Surgeon: Ceasar Mons, MD;  Location[facility];  Service: Urology;  Laterality: N/A;  ONLY NEEDS 30 MIN   BREAST EXCISIONAL BIOPSY Right    x 3   BREAST EXCISIONAL BIOPSY Left    12-29-2000  and 11-30-2001 _0  by dr Marlou Starks   CARPAL TUNNEL RELEASE Bilateral    right 08-30-2002 and left 07-04-2003  by dr Louanne Skye @ [facility]   CATARACT EXTRACTION W/ INTRAOCULAR LENS IMPLANT Bilateral 12/2020   CERVICAL FUSION  04/10/2011   x3- cervical fusion with plating and screws-Dr. [name]   COLONOSCOPY WITH PROPOFOL N/A 07/06/2015   Procedure: COLONOSCOPY WITH PROPOFOL;  Surgeon: [name], MD;  [facility];  Service: Endoscopy;  Laterality: N/A;   COLONOSCOPY WITH PROPOFOL N/A 03/09/2019   Procedure: COLONOSCOPY WITH PROPOFOL;  Surgeon: Thornton Park, MD;  [facility];  Service: Gastroenterology;  Laterality: N/A;   FRACTURE SURGERY     little toe left foot   I & D EXTREMITY Left 12/18/2016   Procedure: IRRIGATION AND DEBRIDEMENT LEFT FOOT, CLOSURE;  Surgeon: [name], MD;  Location: [place];  Service: Orthopedics;  Laterality: Left;   INCISION AND DRAINAGE ABSCESS Right 05/03/2002   _1  by dr Marlou Starks;   right breast abscess   INJECTION KNEE Right 10/03/2020   Procedure: KNEE INJECTION;  Surgeon: Jessy Oto, MD;  Location: [place];  Service: Orthopedics;  Laterality: Right;   KNEE ARTHROSCOPY WITH MEDIAL MENISECTOMY Right 03/21/2021   Procedure: RIGHT KNEE ARTHROSCOPY WITH MEDIAL AND PARTIAL LATERAL MENISCECTOMY;  Surgeon: [name], MD;  Location: [place];  Service: Orthopedics;  Laterality: Right;   LAPAROSCOPIC GASTRIC  SLEEVE RESECTION N/A 05/08/2020   Procedure: LAPAROSCOPIC GASTRIC SLEEVE RESECTION;  Surgeon: Kieth Brightly Arta Bruce, MD;  [facility];  Service: General;  Laterality: N/A;   [place] SURGERY  04/10/2011   x5-Lumbar fusion-retained hardware.(Dr. Louanne Skye)   LUMBAR LAMINECTOMY N/A 10/03/2020   Procedure: THORACOLUMBAR LAMINECTOMIES THORACIC ELEVEN-TWELVE, THORACIC TWELVE-LUMBAR ONE  AND LUMBAT ONE-TWO;  Surgeon: Jessy Oto, MD;  Location: [place];  Service: Orthopedics;  Laterality: N/A;   LUMBAR PERCUTANEOUS PEDICLE SCREW 2 LEVEL  06/29/2021   Procedure: LUMBAR PERCUTANEOUS PEDICLE SCREW LUMBAR ONE-TWO;  Surgeon: Earnie Larsson, MD;  Location: [place];  Service: Neurosurgery;;   MULTIPLE TOOTH EXTRACTIONS     POLYPECTOMY  03/09/2019   Procedure: POLYPECTOMY;  Surgeon: Thornton Park, MD;  [facility];  Service: Gastroenterology;;   POSTERIOR LUMBAR FUSION  04/04/2005   _2  by dr Louanne Skye;   L3-- L5   POSTERIOR LUMBAR FUSION  03/04/2006   @  [facility] dr Louanne Skye;   fusion L2--3 and redo L3--5   SHOULDER ARTHROSCOPY WITH ROTATOR CUFF REPAIR AND SUBACROMIAL DECOMPRESSION Left 05/07/2019   Procedure: LEFT SHOULDER ARTHROSCOPY WITH DEBRIDEMENT, DISTAL CLAVICLE EXCISION,  SUBACROMIAL DECOMPRESSION AND POSSIBLE ROTATOR CUFF REPAIR;  Surgeon: [name], MD;  Location: [place];  Service: Orthopedics;  Laterality: Left;   TEE WITHOUT CARDIOVERSION N/A 04/06/2012   Procedure: TRANSESOPHAGEAL ECHOCARDIOGRAM (TEE);  Surgeon: [name], MD;  Location: [facility];  Service: Cardiovascular;  Laterality: N/A;   TRIGGER FINGER RELEASE Right    middle finger   TRIGGER FINGER RELEASE Left 06/28/2016   Procedure: RELEASE TRIGGER FINGER LEFT 3RD FINGER;  Surgeon: [name], MD;  Location: [place];  Service: Orthopedics;  Laterality: Left;   TRIGGER FINGER RELEASE Right 06/12/2016   Procedure: RIGHT INDEX FINGER TRIGGER RELEASE;  Surgeon: [name], MD;  Location: [place];  Service: Orthopedics;   Laterality: Right;   TRIGGER FINGER RELEASE Right 01/19/2018   Procedure: RIGHT RING FINGER TRIGGER FINGER RELEASE;  Surgeon: [name], MD;  Location: [place];  Service: Orthopedics;  Laterality: Right;   TRIGGER FINGER RELEASE Left 05/07/2019   Procedure: RELEASE TRIGGER FINGER LEFT INDEX FINGER;  Surgeon: [name], MD;  Location: [place];  Service: Orthopedics;  Laterality: Left;   UPPER GI ENDOSCOPY N/A 05/08/2020   Procedure: UPPER GI ENDOSCOPY;  Surgeon: Kieth Brightly Arta Bruce, MD;  [facility];  Service: General;  Laterality: N/A;   Patient Active Problem List   Diagnosis Date Noted   Panniculitis 12/04/2021   Infection of lip 08/27/2021   Lumbar stenosis with neurogenic claudication 06/29/2021   Lumbar radiculopathy 06/29/2021   Biceps tendinopathy of right upper extremity 05/03/2021   Irregular heartbeat 04/11/2021   Neuropathic pain of foot 03/28/2021   Chronic congestion of paranasal sinus 03/28/2021   Impingement syndrome of right shoulder 03/08/2021   Arthritis of right acromioclavicular joint 03/08/2021   Acute medial meniscus tear of right knee 03/08/2021   Acute lateral meniscus tear of right knee 03/08/2021   Spinal stenosis of thoracic region with radiculopathy    Bilateral leg numbness    Spinal stenosis of lumbar region with neurogenic claudication    Primary osteoarthritis of right knee    Impaired ambulation 01/10/2021   Status post lumbar laminectomy 10/03/2020   Cubital tunnel syndrome on left 05/02/2020   Vaginal discharge 01/20/2020   Prolapse of anterior vaginal wall 11/10/2019   Skin ulcer of left thigh, limited to breakdown of skin ([place]) 09/09/2019   Ulnar nerve compression 08/06/2019   Diabetic neuropathy associated with type 2 diabetes mellitus ([place]) 08/06/2019   Body mass index 50.0-59.9, adult ([place]) 06/12/2019   Impingement syndrome of left shoulder 05/07/2019   Polyp of colon    Polyp of ascending colon    Rectal discomfort 02/12/2019    Osteoarthritis of right hip 02/12/2019   Nontraumatic tear of left supraspinatus tendon 12/30/2018   Gout 12/17/2018   Nontraumatic complete tear of right rotator cuff 12/08/2018   Rotator cuff tear, right 12/01/2018   Tobacco abuse 08/21/2018   Right groin pain 07/20/2018   Depressed mood 07/13/2018   Paroxysmal SVT (supraventricular tachycardia) 03/20/2018   Pure hypercholesterolemia 03/20/2018   Muscle cramping 02/25/2018   Nodule of upper lobe of right lung 02/13/2018   Falls, subsequent encounter 01/30/2018   Pain in left foot 11/04/2017   Essential hypertension 10/27/2017   Solitary pulmonary nodule 10/07/2017   Restrictive lung disease secondary  to obesity 10/01/2017   Polycythemia, secondary 10/01/2017   Chronic viral hepatitis B without delta-agent ([place]) 07/08/2017   COPD (chronic obstructive pulmonary disease) with chronic bronchitis 06/27/2017   Chronic kidney disease (CKD), stage IV (severe) ([place]) 01/14/2017   Decubitus ulcer 12/13/2016   Type 2 diabetes mellitus with stage 3 chronic kidney disease, with long-term current use of insulin ([place]) 12/12/2016   Urge incontinence of urine 12/05/2016   Trigger finger, left index finger 07/15/2016   Back pain 04/07/2014   Morbid obesity ([place]) 10/02/2012   Chronic diastolic CHF (congestive heart failure) ([place]) 04/07/2012   Skin lesion of scalp 03/15/2011   GERD 01/26/2010   Obstructive sleep apnea treated with BiPAP 02/03/2008   FIBROCYSTIC BREAST DISEASE 10/28/2006   Hyperlipidemia 03/27/2006   Obesity hypoventilation syndrome ([place]) 03/27/2006   Depression with anxiety 03/27/2006    REFERRING DIAG: chronic low back   THERAPY DIAG:  Chronic bilateral low back pain without sciatica  Pain in right hip  Chronic pain of right knee  Other abnormalities of gait and mobility  Muscle weakness (generalized)  Rationale for Evaluation and Treatment Rehabilitation  PERTINENT HISTORY: The patient has multiple medical  conditions that would contribute to very slow and poor healing after surgery. She also has a history of not healing well. She will have to be 3 months tobacco free, hemoglobin A1c less than 6 and down to her desired weight less than 200 pounds in order to do the surgery. Would benefit from physical therapy and is willing to do it. I will put the order in. We are happy to see her back when she has completed the above tasks. Had back surgery on 6/2.  Wound dehissed.  No nurse visit or PT post surgery.  Asks for aid, Needs help with bath.  She would like PT and can go to outpatient.  Had previous good experience with [facility] [address]: None  SUBJECTIVE:                                                                                                                                                                                      SUBJECTIVE STATEMENT:  Patient reports high levels of lower back today but is very excited about beginning aquatic therapy.   PAIN:  Are you having pain? Yes: NPRS scale: 9/10 Pain location: B low back Pain description: ache Aggravating factors: movement Relieving factors: rest   OBJECTIVE: (objective measures completed at initial evaluation unless otherwise dated)   DIAGNOSTIC FINDINGS:  IMPRESSION: 1. Left foraminal stenosis C2-3 secondary to uncovertebral and facet DJD. Mild disc bulge. 2. Solid-appearing fusion C3-T2 as detailed above. 3.  Large extrusion T2-3 extending inferiorly, with anterior cord flattening. 4. Smaller multilevel disc protrusions T3-8, largest at T6-7 with anterior cord flattening. 5. Moderate to severe spinal stenosis L1-2 despite previous posterior decompression. 6. Solid-appearing fusion L2-L5.   PATIENT SURVEYS:  Modified Oswestry 41/50    SCREENING FOR RED FLAGS: negative   COGNITION: Overall cognitive status: Within functional limits for tasks assessed                          SENSATION: Not tested    MUSCLE LENGTH: Hamstrings: Right 90 deg; Left 90 deg(measured in sitting) Thomas test:    POSTURE: rounded shoulders, forward head, decreased lumbar lordosis, increased thoracic kyphosis, flexed trunk , and R lateral shift   PALPATION: Not tested   LUMBAR ROM:    AROM eval  Flexion 33%  Extension 10%  Right lateral flexion    Left lateral flexion    Right rotation 25%  Left rotation 25%   (Blank rows = not tested)   LOWER EXTREMITY ROM:      Passive  Right eval Left eval  Hip flexion 120d 120d  Hip extension      Hip abduction      Hip adduction      Hip internal rotation      Hip external rotation      Knee flexion [facility] WFL  Knee extension 0d 0d  Ankle dorsiflexion      Ankle plantarflexion      Ankle inversion      Ankle eversion       (Blank rows = not tested)   LOWER EXTREMITY MMT:     MMT Right eval Left eval  Hip flexion 4 4  Hip extension 4 4  Hip abduction      Hip adduction      Hip internal rotation      Hip external rotation      Knee flexion 4 4  Knee extension 4 4  Ankle dorsiflexion      Ankle plantarflexion 4 4  Ankle inversion      Ankle eversion      Core 3 3   (Blank rows = not tested)   LUMBAR SPECIAL TESTS:  Straight leg raise test: Negative and Slump test: Negative   FUNCTIONAL TESTS:  5 times sit to stand: 23s with UE support   GAIT: Distance walked: 75 ft x2 Assistive device utilized: Single point cane Level of assistance: Modified independence Comments: antalgic gait pattern   TODAY'S TREATMENT:  [facility] Adult PT Treatment:                                                DATE: 12/28/21 Aquatic therapy at [address] - therapeutic pool temp 92 degrees Pt enters building in wheelchair pushed by security  Treatment took place in water 3.8 to  4 ft 8 in.feet deep depending upon activity.  Pt entered and exited the pool via stair and handrails independently with step to pattern.  Pt pain level 9/10 at  initiation of water walking. Patient entered water for aquatic therapy for first time and was introduced to principles and therapeutic effects of water as they ambulated and acclimated to pool.  Therapeutic Exercise: Walking forward/backwards/side stepping Runners stretch on bottom step x30" BIL Hamstring stretch on bottom step  x30" BIL STS from bench x20 At edge of pool, pt performed LE exercise: Hip abd/add x20 BIL Hip ext/flex with knee straight x 20 BIL Marching hip flexion to knee extension 2x10 BIL Hamstring curl x20 BIL Squats 2x20 Heel raises 2x20 Toe raises 2x20 Sitting on bench in water: LAQ x1' Bicycle kicks x1' Scissor kicks x1'  Pt requires the buoyancy of water for active assisted exercises with buoyancy supported for strengthening and AROM exercises. Hydrostatic pressure also supports joints by unweighting joint load by at least 50 % in 3-4 feet depth water. 80% in chest to neck deep water. Water will provide assistance with movement using the current and laminar flow while the buoyancy reduces weight bearing. Pt requires the viscosity of the water for resistance with strengthening exercises.                                                                                                                                DATE: 12/07/21      PATIENT EDUCATION:  Education details: Discussed eval findings, rehab rationale and POC and patient is in agreement  Person educated: Patient Education method: Explanation Education comprehension: verbalized understanding and needs further education   HOME EXERCISE PROGRAM: Access Code: [email or web address] URL: [email or web address] Date: 12/07/2021 Prepared by: Sharlynn Oliphant   Exercises - Sit to Stand with Armchair  - 2 x daily - 5 x weekly - 1 sets - 5 reps - Sit to Stand  - 2 x daily - 5 x weekly - 1 sets - 5 reps - Sit to Stand Without Arm Support  - 2 x daily - 5 x weekly - 1 sets - 10 reps - 30s hold - Standing Heel  Raise with Support  - 2 x daily - 5 x weekly - 1 sets - 10 reps   ASSESSMENT:   CLINICAL IMPRESSION: Patient presents to first aquatic PT session repoting 9/10 lower back pain but is excited about beginning aquatic therapy. Session today focused on proximal hip, knee, and ankle strengthening as well as general conditioning in the aquatic environment for use of buoyancy to offload joints and the viscosity of water as resistance during therapeutic exercise. Patient was able to tolerate all prescribed exercises in the aquatic environment with no adverse effects and reports 5/10 pain at the end of the session. Patient continues to benefit from skilled PT services on land and aquatic based and should be progressed as able to improve functional independence.    OBJECTIVE IMPAIRMENTS: Abnormal gait, decreased activity tolerance, decreased balance, decreased endurance, decreased knowledge of condition, decreased mobility, difficulty walking, decreased ROM, decreased strength, increased fascial restrictions, postural dysfunction, obesity, and pain.    ACTIVITY LIMITATIONS: carrying, lifting, bending, sitting, standing, squatting, sleeping, stairs, transfers, and bed mobility   PERSONAL FACTORS: Age, Behavior pattern, Fitness, Past/current experiences, Time since onset of injury/illness/exacerbation, and 6 prior back surgeries are also affecting patient's functional outcome.    REHAB POTENTIAL: Fair  based on chronicity, prior surgical interventions and limited response to treatments to date.   CLINICAL DECISION MAKING: Evolving/moderate complexity   EVALUATION COMPLEXITY: Moderate     GOALS: Goals reviewed with patient? No   SHORT TERM GOALS: Target date: 12/21/2021   Patient to demonstrate independence in HEP  Baseline: [id number] Goal status: INITIAL     LONG TERM GOALS: Target date: 01/18/2022   Decrease 5x STS with UE support time to 20s Baseline: 23s Goal status: INITIAL   2.  Decrease  ODI score to 33/50 for MCID Baseline: 41 Goal status: INITIAL   3.  Increase lumbar flexion ROM to 50% Baseline: 33% Goal status: INITIAL   4. Increase core strength to 3+/5            Baseline: 3/5            Goal Status: INITIAL     PLAN:   PT FREQUENCY: 1x/week   PT DURATION: 6 weeks   PLANNED INTERVENTIONS: Therapeutic exercises, Therapeutic activity, Neuromuscular re-education, Balance training, Gait training, Patient/Family education, Self Care, Joint mobilization, Stair training, Aquatic Therapy, Manual therapy, and Re-evaluation.   PLAN FOR NEXT SESSION: Aquatic therapy, core strength, LE strength, flexibility tasks, HEP review and update   Lanice Shirts, PT 01/30/2022, 6:13 PM

## 2021-12-28 ENCOUNTER — Ambulatory Visit: Payer: 59 | Attending: Plastic Surgery

## 2021-12-28 DIAGNOSIS — M25551 Pain in right hip: Secondary | ICD-10-CM

## 2021-12-28 DIAGNOSIS — G8929 Other chronic pain: Secondary | ICD-10-CM | POA: Diagnosis present

## 2021-12-28 DIAGNOSIS — R2689 Other abnormalities of gait and mobility: Secondary | ICD-10-CM

## 2021-12-28 DIAGNOSIS — Z9889 Other specified postprocedural states: Secondary | ICD-10-CM | POA: Insufficient documentation

## 2021-12-28 DIAGNOSIS — M793 Panniculitis, unspecified: Secondary | ICD-10-CM | POA: Diagnosis not present

## 2021-12-28 DIAGNOSIS — M6281 Muscle weakness (generalized): Secondary | ICD-10-CM

## 2021-12-28 DIAGNOSIS — M47892 Other spondylosis, cervical region: Secondary | ICD-10-CM | POA: Insufficient documentation

## 2021-12-28 DIAGNOSIS — M25561 Pain in right knee: Secondary | ICD-10-CM | POA: Insufficient documentation

## 2021-12-28 DIAGNOSIS — M48061 Spinal stenosis, lumbar region without neurogenic claudication: Secondary | ICD-10-CM | POA: Insufficient documentation

## 2021-12-28 DIAGNOSIS — Z981 Arthrodesis status: Secondary | ICD-10-CM | POA: Diagnosis not present

## 2021-12-28 DIAGNOSIS — M545 Low back pain, unspecified: Secondary | ICD-10-CM | POA: Diagnosis present

## 2021-12-31 ENCOUNTER — Ambulatory Visit: Payer: 59 | Admitting: Pulmonary Disease

## 2022-01-02 ENCOUNTER — Ambulatory Visit: Payer: 59 | Admitting: Podiatry

## 2022-01-03 NOTE — Therapy (Incomplete)
OUTPATIENT PHYSICAL THERAPY TREATMENT NOTE   Patient Name: [name] MRN: [id number] DOB:Jan 06, 1966, 56 y.o., female Today's Date: 01/03/2022  PCP: Alen Bleacher, MD   REFERRING PROVIDER: Wallace Going, [id number] (ICD-10-CM) - Status post lumbar laminectomy M10.9 (ICD-10-CM) - Gout of shoulder region, unspecified cause, unspecified chronicity, unspecified laterality M79.3 (ICD-10-CM) - Panniculitis   END OF SESSION:     Past Medical History:  Diagnosis Date   Alcoholism in remission ([place])    11-26-2021  pt stated no alcohol few yrs ago   Asthma    Bipolar disorder ([place])    Chronic diastolic CHF (congestive heart failure) ([place]) 2014   followed by dr b. Harrell Gave;   Chronic hypoxemic respiratory failure [facility])    pulmonologist--- dr Valeta Harms;   (11-26-2021 per pt was on supplemental oxygen until she 01/ 2022 did not need it,  does not check O2 sats at home anymore.   CKD (chronic kidney disease), stage III [facility])    nephrologist--- dr Carolin Sicks   COPD (chronic obstructive pulmonary disease) ([place])    11-26-2021 pt trying to quit smoking ,  started age 18   Depression    Diabetes mellitus type 2, diet-controlled ([place])    followed by pcp   (11-26-2021  per pt last A1c by pcp on 10-30-2021 7.8,  stated does not check blood sugar)   Dyspnea    Fibrocystic breast disease    GAD (generalized anxiety disorder)    GERD (gastroesophageal reflux disease)    Hepatitis B surface antigen positive 11/2017   pt stated asymptomatic ,  no treatment   History of cervical dysplasia 04/10/2011   '93- once dx.-got pregnant-no intervention, then postpartum, no dysplasia found   History of condyloma acuminatum 04/09/2011   Hyperlipidemia    Hypertension    Incomplete left bundle branch block (LBBB)    Lung nodule 11/2017   pulmonologist--- dr Valeta Harms;  RUL and RLL   Muscle spasm of both lower legs    chronic , due to back issues,   OA (osteoarthritis) 04/10/2011   hips, shoulders, back    OSA (obstructive sleep apnea) 2009   study on epic 09-18-2007 mild osa w/ hypoxmia ;   (11-26-2021 per pt last used cpap 01/ 2022 before bariatric surgery 04/ 2022 stated lost wt / and stopped needing supplemental oxygen , did not need anymore)   Polyneuropathy    due to back issues and DM   Polysubstance abuse ([place])    11-26-2021  pt hx cocaine use stated none for yrs but ED visit 08-14-2021  positive UDS,  pt stated it was once only for depression none since   PSVT (paroxysmal supraventricular tachycardia)    followed by cardiology   Spinal stenosis, unspecified spinal region    Urge incontinence of urine    Wears dentures    upper   Past Surgical History:  Procedure Laterality Date   ANAL FISTULECTOMY  04/17/2011   Procedure: FISTULECTOMY ANAL;  Surgeon: Imogene Burn. [name], MD;  [facility]: [facility];  Service: General;  Laterality: N/A;  Excision of Condyloma Gluteal Cleft    ANAL RECTAL MANOMETRY N/A 03/26/2019   Procedure: ANO RECTAL MANOMETRY;  Surgeon: Thornton Park, MD;  [facility];  Service: Gastroenterology;  Laterality: N/A;   BIOPSY  03/09/2019   Procedure: BIOPSY;  Surgeon: Thornton Park, MD;  [facility];  Service: Gastroenterology;;   BOTOX INJECTION N/A 12/29/2018   Procedure: BOTOX INJECTION(100 UNITS)  WITH CYSTOSCOPY;  Surgeon: Ceasar Mons, MD;  [facility];  Service: Urology;  Laterality: N/A;   BOTOX INJECTION N/A 07/27/2019   Procedure: BOTOX INJECTION WITH CYSTOSCOPY;  Surgeon: Ceasar Mons, MD;  [facility];  Service: Urology;  Laterality: N/A;  ONLY NEEDS 30 MIN   BOTOX INJECTION N/A 05/02/2021   Procedure: BOTOX INJECTION WITH CYSTOSCOPY;  Surgeon: Ceasar Mons, MD;  Location: [facility];  Service: Urology;  Laterality: N/A;  ONLY NEEDS 30 MIN   BOTOX INJECTION N/A 11/28/2021   Procedure: BOTOX INJECTION WITH CYSTOSCOPY, 200 UNITS;  Surgeon: Ceasar Mons, MD;   Location: [facility];  Service: Urology;  Laterality: N/A;  ONLY NEEDS 30 MIN   BREAST EXCISIONAL BIOPSY Right    x 3   BREAST EXCISIONAL BIOPSY Left    12-29-2000  and 11-30-2001 [facility]  by dr Marlou Starks   CARPAL TUNNEL RELEASE Bilateral    right 08-30-2002 and left 07-04-2003  by dr Louanne Skye @ [facility]   CATARACT EXTRACTION W/ INTRAOCULAR LENS IMPLANT Bilateral 12/2020   CERVICAL FUSION  04/10/2011   x3- cervical fusion with plating and screws-Dr. [name]   COLONOSCOPY WITH PROPOFOL N/A 07/06/2015   Procedure: COLONOSCOPY WITH PROPOFOL;  Surgeon: [name], MD;  [facility];  Service: Endoscopy;  Laterality: N/A;   COLONOSCOPY WITH PROPOFOL N/A 03/09/2019   Procedure: COLONOSCOPY WITH PROPOFOL;  Surgeon: Thornton Park, MD;  [facility];  Service: Gastroenterology;  Laterality: N/A;   FRACTURE SURGERY     little toe left foot   I & D EXTREMITY Left 12/18/2016   Procedure: IRRIGATION AND DEBRIDEMENT LEFT FOOT, CLOSURE;  Surgeon: [name], MD;  Location: [place];  Service: Orthopedics;  Laterality: Left;   INCISION AND DRAINAGE ABSCESS Right 05/03/2002   '[facility]  by dr Marlou Starks;   right breast abscess   INJECTION KNEE Right 10/03/2020   Procedure: KNEE INJECTION;  Surgeon: Jessy Oto, MD;  Location: [place];  Service: Orthopedics;  Laterality: Right;   KNEE ARTHROSCOPY WITH MEDIAL MENISECTOMY Right 03/21/2021   Procedure: RIGHT KNEE ARTHROSCOPY WITH MEDIAL AND PARTIAL LATERAL MENISCECTOMY;  Surgeon: [name], MD;  Location: [place];  Service: Orthopedics;  Laterality: Right;   LAPAROSCOPIC GASTRIC SLEEVE RESECTION N/A 05/08/2020   Procedure: LAPAROSCOPIC GASTRIC SLEEVE RESECTION;  Surgeon: Kieth Brightly Arta Bruce, MD;  [facility];  Service: General;  Laterality: N/A;   [place] SURGERY  04/10/2011   x5-Lumbar fusion-retained hardware.(Dr. Louanne Skye)   LUMBAR LAMINECTOMY N/A 10/03/2020   Procedure: THORACOLUMBAR LAMINECTOMIES THORACIC  ELEVEN-TWELVE, THORACIC TWELVE-LUMBAR ONE  AND LUMBAT ONE-TWO;  Surgeon: Jessy Oto, MD;  Location: [place];  Service: Orthopedics;  Laterality: N/A;   LUMBAR PERCUTANEOUS PEDICLE SCREW 2 LEVEL  06/29/2021   Procedure: LUMBAR PERCUTANEOUS PEDICLE SCREW LUMBAR ONE-TWO;  Surgeon: Earnie Larsson, MD;  Location: [place];  Service: Neurosurgery;;   MULTIPLE TOOTH EXTRACTIONS     POLYPECTOMY  03/09/2019   Procedure: POLYPECTOMY;  Surgeon: Thornton Park, MD;  [facility];  Service: Gastroenterology;;   POSTERIOR LUMBAR FUSION  04/04/2005   '[facility]  by dr Louanne Skye;   L3-- L5   POSTERIOR LUMBAR FUSION  03/04/2006   '[facility]  dr Louanne Skye;   fusion L2--3 and redo L3--5   SHOULDER ARTHROSCOPY WITH ROTATOR CUFF REPAIR AND SUBACROMIAL DECOMPRESSION Left 05/07/2019   Procedure: LEFT SHOULDER ARTHROSCOPY WITH DEBRIDEMENT, DISTAL CLAVICLE EXCISION,  SUBACROMIAL DECOMPRESSION AND POSSIBLE ROTATOR CUFF REPAIR;  Surgeon: [name], MD;  Location: [place];  Service: Orthopedics;  Laterality: Left;   TEE WITHOUT CARDIOVERSION N/A 04/06/2012   Procedure: TRANSESOPHAGEAL ECHOCARDIOGRAM (TEE);  Surgeon: [name], MD;  Location: [facility];  Service: Cardiovascular;  Laterality: N/A;   TRIGGER FINGER RELEASE Right    middle finger   TRIGGER FINGER RELEASE Left 06/28/2016   Procedure: RELEASE TRIGGER FINGER LEFT 3RD FINGER;  Surgeon: [name], MD;  Location: [place];  Service: Orthopedics;  Laterality: Left;   TRIGGER FINGER RELEASE Right 06/12/2016   Procedure: RIGHT INDEX FINGER TRIGGER RELEASE;  Surgeon: [name], MD;  Location: [place];  Service: Orthopedics;  Laterality: Right;   TRIGGER FINGER RELEASE Right 01/19/2018   Procedure: RIGHT RING FINGER TRIGGER FINGER RELEASE;  Surgeon: [name], MD;  Location: [place];  Service: Orthopedics;  Laterality: Right;   TRIGGER FINGER RELEASE Left 05/07/2019   Procedure: RELEASE TRIGGER FINGER LEFT INDEX FINGER;  Surgeon: [name], MD;  Location: [place];   Service: Orthopedics;  Laterality: Left;   UPPER GI ENDOSCOPY N/A 05/08/2020   Procedure: UPPER GI ENDOSCOPY;  Surgeon: Kieth Brightly Arta Bruce, MD;  [facility];  Service: General;  Laterality: N/A;   Patient Active Problem List   Diagnosis Date Noted   Panniculitis 12/04/2021   Infection of lip 08/27/2021   Lumbar stenosis with neurogenic claudication 06/29/2021   Lumbar radiculopathy 06/29/2021   Biceps tendinopathy of right upper extremity 05/03/2021   Irregular heartbeat 04/11/2021   Neuropathic pain of foot 03/28/2021   Chronic congestion of paranasal sinus 03/28/2021   Impingement syndrome of right shoulder 03/08/2021   Arthritis of right acromioclavicular joint 03/08/2021   Acute medial meniscus tear of right knee 03/08/2021   Acute lateral meniscus tear of right knee 03/08/2021   Spinal stenosis of thoracic region with radiculopathy    Bilateral leg numbness    Spinal stenosis of lumbar region with neurogenic claudication    Primary osteoarthritis of right knee    Impaired ambulation 01/10/2021   Status post lumbar laminectomy 10/03/2020   Cubital tunnel syndrome on left 05/02/2020   Vaginal discharge 01/20/2020   Prolapse of anterior vaginal wall 11/10/2019   Skin ulcer of left thigh, limited to breakdown of skin ([place]) 09/09/2019   Ulnar nerve compression 08/06/2019   Diabetic neuropathy associated with type 2 diabetes mellitus ([place]) 08/06/2019   Body mass index 50.0-59.9, adult ([place]) 06/12/2019   Impingement syndrome of left shoulder 05/07/2019   Polyp of colon    Polyp of ascending colon    Rectal discomfort 02/12/2019   Osteoarthritis of right hip 02/12/2019   Nontraumatic tear of left supraspinatus tendon 12/30/2018   Gout 12/17/2018   Nontraumatic complete tear of right rotator cuff 12/08/2018   Rotator cuff tear, right 12/01/2018   Tobacco abuse 08/21/2018   Right groin pain 07/20/2018   Depressed mood 07/13/2018   Paroxysmal SVT (supraventricular  tachycardia) 03/20/2018   Pure hypercholesterolemia 03/20/2018   Muscle cramping 02/25/2018   Nodule of upper lobe of right lung 02/13/2018   Falls, subsequent encounter 01/30/2018   Pain in left foot 11/04/2017   Essential hypertension 10/27/2017   Solitary pulmonary nodule 10/07/2017   Restrictive lung disease secondary to obesity 10/01/2017   Polycythemia, secondary 10/01/2017   Chronic viral hepatitis B without delta-agent ([place]) 07/08/2017   COPD (chronic obstructive pulmonary disease) with chronic bronchitis 06/27/2017   Chronic kidney disease (CKD), stage IV (severe) ([place]) 01/14/2017   Decubitus ulcer 12/13/2016   Type 2 diabetes mellitus with stage 3 chronic kidney  disease, with long-term current use of insulin ([place]) 12/12/2016   Urge incontinence of urine 12/05/2016   Trigger finger, left index finger 07/15/2016   Back pain 04/07/2014   Morbid obesity ([place]) 10/02/2012   Chronic diastolic CHF (congestive heart failure) ([place]) 04/07/2012   Skin lesion of scalp 03/15/2011   GERD 01/26/2010   Obstructive sleep apnea treated with BiPAP 02/03/2008   FIBROCYSTIC BREAST DISEASE 10/28/2006   Hyperlipidemia 03/27/2006   Obesity hypoventilation syndrome ([place]) 03/27/2006   Depression with anxiety 03/27/2006    REFERRING DIAG: chronic low back   THERAPY DIAG:  No diagnosis found.  Rationale for Evaluation and Treatment Rehabilitation  PERTINENT HISTORY: The patient has multiple medical conditions that would contribute to very slow and poor healing after surgery. She also has a history of not healing well. She will have to be 3 months tobacco free, hemoglobin A1c less than 6 and down to her desired weight less than 200 pounds in order to do the surgery. Would benefit from physical therapy and is willing to do it. I will put the order in. We are happy to see her back when she has completed the above tasks. Had back surgery on 6/2.  Wound dehissed.  No nurse visit or PT post surgery.   Asks for aid, Needs help with bath.  She would like PT and can go to outpatient.  Had previous good experience with [facility] on [place]: None  SUBJECTIVE:                                                                                                                                                                                      SUBJECTIVE STATEMENT:  Patient reports high levels of lower back today but is very excited about beginning aquatic therapy.   PAIN:  Are you having pain? Yes: NPRS scale: 9/10 Pain location: B low back Pain description: ache Aggravating factors: movement Relieving factors: rest   OBJECTIVE: (objective measures completed at initial evaluation unless otherwise dated)   DIAGNOSTIC FINDINGS:  IMPRESSION: 1. Left foraminal stenosis C2-3 secondary to uncovertebral and facet DJD. Mild disc bulge. 2. Solid-appearing fusion C3-T2 as detailed above. 3. Large extrusion T2-3 extending inferiorly, with anterior cord flattening. 4. Smaller multilevel disc protrusions T3-8, largest at T6-7 with anterior cord flattening. 5. Moderate to severe spinal stenosis L1-2 despite previous posterior decompression. 6. Solid-appearing fusion L2-L5.   PATIENT SURVEYS:  Modified Oswestry 41/50    SCREENING FOR RED FLAGS: negative   COGNITION: Overall cognitive status: Within functional limits for tasks assessed  SENSATION: Not tested   MUSCLE LENGTH: Hamstrings: Right 90 deg; Left 90 deg(measured in sitting) Thomas test:    POSTURE: rounded shoulders, forward head, decreased lumbar lordosis, increased thoracic kyphosis, flexed trunk , and R lateral shift   PALPATION: Not tested   LUMBAR ROM:    AROM eval  Flexion 33%  Extension 10%  Right lateral flexion    Left lateral flexion    Right rotation 25%  Left rotation 25%   (Blank rows = not tested)   LOWER EXTREMITY ROM:      Passive  Right eval Left eval  Hip  flexion 120d 120d  Hip extension      Hip abduction      Hip adduction      Hip internal rotation      Hip external rotation      Knee flexion [facility] WFL  Knee extension 0d 0d  Ankle dorsiflexion      Ankle plantarflexion      Ankle inversion      Ankle eversion       (Blank rows = not tested)   LOWER EXTREMITY MMT:     MMT Right eval Left eval  Hip flexion 4 4  Hip extension 4 4  Hip abduction      Hip adduction      Hip internal rotation      Hip external rotation      Knee flexion 4 4  Knee extension 4 4  Ankle dorsiflexion      Ankle plantarflexion 4 4  Ankle inversion      Ankle eversion      Core 3 3   (Blank rows = not tested)   LUMBAR SPECIAL TESTS:  Straight leg raise test: Negative and Slump test: Negative   FUNCTIONAL TESTS:  5 times sit to stand: 23s with UE support   GAIT: Distance walked: 75 ft x2 Assistive device utilized: Single point cane Level of assistance: Modified independence Comments: antalgic gait pattern   TODAY'S TREATMENT:  [facility] Adult PT Treatment:                                                DATE: 01/04/2022 Aquatic therapy at [address] - therapeutic pool temp 92 degrees Pt enters building in wheelchair pushed by security  Treatment took place in water 3.8 to  4 ft 8 in.feet deep depending upon activity.  Pt entered and exited the pool via stair and handrails independently with step to pattern.   Therapeutic Exercise: Walking forward/backwards/side stepping Runners stretch on bottom step x30" BIL Hamstring stretch on bottom step x30" BIL STS from bench x20 At edge of pool, pt performed LE exercise: Hip abd/add x20 BIL Hip ext/flex with knee straight x 20 BIL Marching hip flexion to knee extension 2x10 BIL Hamstring curl x20 BIL Squats 2x20 Heel raises 2x20 Toe raises 2x20 Sitting on bench in water: LAQ x1' Bicycle kicks x1' Scissor kicks x1'  Pt requires the buoyancy of water for active assisted  exercises with buoyancy supported for strengthening and AROM exercises. Hydrostatic pressure also supports joints by unweighting joint load by at least 50 % in 3-4 feet depth water. 80% in chest to neck deep water. Water will provide assistance with movement using the current and laminar flow while the buoyancy reduces weight bearing. Pt requires the  viscosity of the water for resistance with strengthening exercises.  [facility] Adult PT Treatment:                                                DATE: 12/28/21 Aquatic therapy at [address] - therapeutic pool temp 92 degrees Pt enters building in wheelchair pushed by security  Treatment took place in water 3.8 to  4 ft 8 in.feet deep depending upon activity.  Pt entered and exited the pool via stair and handrails independently with step to pattern.  Pt pain level 9/10 at initiation of water walking. Patient entered water for aquatic therapy for first time and was introduced to principles and therapeutic effects of water as they ambulated and acclimated to pool.  Therapeutic Exercise: Walking forward/backwards/side stepping Runners stretch on bottom step x30" BIL Hamstring stretch on bottom step x30" BIL STS from bench x20 At edge of pool, pt performed LE exercise: Hip abd/add x20 BIL Hip ext/flex with knee straight x 20 BIL Marching hip flexion to knee extension 2x10 BIL Hamstring curl x20 BIL Squats 2x20 Heel raises 2x20 Toe raises 2x20 Sitting on bench in water: LAQ x1' Bicycle kicks x1' Scissor kicks x1'  Pt requires the buoyancy of water for active assisted exercises with buoyancy supported for strengthening and AROM exercises. Hydrostatic pressure also supports joints by unweighting joint load by at least 50 % in 3-4 feet depth water. 80% in chest to neck deep water. Water will provide assistance with movement using the current and laminar flow while the buoyancy reduces weight bearing. Pt requires the viscosity of the water  for resistance with strengthening exercises.                                                                                                                                DATE: 12/07/21      PATIENT EDUCATION:  Education details: Discussed eval findings, rehab rationale and POC and patient is in agreement  Person educated: Patient Education method: Explanation Education comprehension: verbalized understanding and needs further education   HOME EXERCISE PROGRAM: Access Code: [email or web address] URL: [email or web address] Date: 12/07/2021 Prepared by: Sharlynn Oliphant   Exercises - Sit to Stand with Armchair  - 2 x daily - 5 x weekly - 1 sets - 5 reps - Sit to Stand  - 2 x daily - 5 x weekly - 1 sets - 5 reps - Sit to Stand Without Arm Support  - 2 x daily - 5 x weekly - 1 sets - 10 reps - 30s hold - Standing Heel Raise with Support  - 2 x daily - 5 x weekly - 1 sets - 10 reps  Aquatic HEP: Access Code: [email or web address] URL: [email or web address] Date: 01/03/2022 Prepared by: Margarette Canada   ASSESSMENT:   CLINICAL IMPRESSION: ***  Patient presents to first aquatic PT session repoting 9/10 lower back pain but is excited about beginning aquatic therapy. Session today focused on proximal hip, knee, and ankle strengthening as well as general conditioning in the aquatic environment for use of buoyancy to offload joints and the viscosity of water as resistance during therapeutic exercise. Patient was able to tolerate all prescribed exercises in the aquatic environment with no adverse effects and reports 5/10 pain at the end of the session. Patient continues to benefit from skilled PT services on land and aquatic based and should be progressed as able to improve functional independence.    OBJECTIVE IMPAIRMENTS: Abnormal gait, decreased activity tolerance, decreased balance, decreased endurance, decreased knowledge of condition, decreased mobility, difficulty walking,  decreased ROM, decreased strength, increased fascial restrictions, postural dysfunction, obesity, and pain.    ACTIVITY LIMITATIONS: carrying, lifting, bending, sitting, standing, squatting, sleeping, stairs, transfers, and bed mobility   PERSONAL FACTORS: Age, Behavior pattern, Fitness, Past/current experiences, Time since onset of injury/illness/exacerbation, and 6 prior back surgeries are also affecting patient's functional outcome.    REHAB POTENTIAL: Fair based on chronicity, prior surgical interventions and limited response to treatments to date.   CLINICAL DECISION MAKING: Evolving/moderate complexity   EVALUATION COMPLEXITY: Moderate     GOALS: Goals reviewed with patient? No   SHORT TERM GOALS: Target date: 12/21/2021   Patient to demonstrate independence in HEP  Baseline: [id number] Goal status: INITIAL     LONG TERM GOALS: Target date: 01/18/2022   Decrease 5x STS with UE support time to 20s Baseline: 23s Goal status: INITIAL   2.  Decrease ODI score to 33/50 for MCID Baseline: 41 Goal status: INITIAL   3.  Increase lumbar flexion ROM to 50% Baseline: 33% Goal status: INITIAL   4. Increase core strength to 3+/5            Baseline: 3/5            Goal Status: INITIAL     PLAN:   PT FREQUENCY: 1x/week   PT DURATION: 6 weeks   PLANNED INTERVENTIONS: Therapeutic exercises, Therapeutic activity, Neuromuscular re-education, Balance training, Gait training, Patient/Family education, Self Care, Joint mobilization, Stair training, Aquatic Therapy, Manual therapy, and Re-evaluation.   PLAN FOR NEXT SESSION: Aquatic therapy, core strength, LE strength, flexibility tasks, HEP review and update   Margarette Canada, PTA 01/03/2022, 10:53 AM

## 2022-01-04 ENCOUNTER — Ambulatory Visit: Payer: 59

## 2022-01-04 ENCOUNTER — Other Ambulatory Visit: Payer: Self-pay | Admitting: Student

## 2022-01-07 ENCOUNTER — Ambulatory Visit: Payer: 59 | Admitting: Student

## 2022-01-07 ENCOUNTER — Other Ambulatory Visit: Payer: Self-pay | Admitting: Student

## 2022-01-07 DIAGNOSIS — I471 Supraventricular tachycardia, unspecified: Secondary | ICD-10-CM

## 2022-01-08 ENCOUNTER — Other Ambulatory Visit: Payer: Self-pay | Admitting: Student

## 2022-01-08 MED ORDER — DILTIAZEM HCL ER COATED BEADS 120 MG PO CP24: 120.0000 mg | ORAL_CAPSULE | Freq: Two times a day (BID) | ORAL | 2 refills | Status: DC

## 2022-01-09 ENCOUNTER — Ambulatory Visit: Payer: 59 | Admitting: Pulmonary Disease

## 2022-01-10 ENCOUNTER — Ambulatory Visit: Payer: 59 | Admitting: Physical Therapy

## 2022-01-10 NOTE — Therapy (Deleted)
OUTPATIENT PHYSICAL THERAPY TREATMENT NOTE   Patient Name: [name] MRN: [id number] DOB:10/25/1965, 56 y.o., female Today's Date: 01/10/2022  PCP: Alen Bleacher, MD   REFERRING PROVIDER: Wallace Going, [id number] (ICD-10-CM) - Status post lumbar laminectomy M10.9 (ICD-10-CM) - Gout of shoulder region, unspecified cause, unspecified chronicity, unspecified laterality M79.3 (ICD-10-CM) - Panniculitis   END OF SESSION:     Past Medical History:  Diagnosis Date   Alcoholism in remission ([place])    11-26-2021  pt stated no alcohol few yrs ago   Asthma    Bipolar disorder ([place])    Chronic diastolic CHF (congestive heart failure) ([place]) 2014   followed by dr b. Harrell Gave;   Chronic hypoxemic respiratory failure [facility])    pulmonologist--- dr Valeta Harms;   (11-26-2021 per pt was on supplemental oxygen until she 01/ 2022 did not need it,  does not check O2 sats at home anymore.   CKD (chronic kidney disease), stage III [facility])    nephrologist--- dr Carolin Sicks   COPD (chronic obstructive pulmonary disease) ([place])    11-26-2021 pt trying to quit smoking ,  started age 12   Depression    Diabetes mellitus type 2, diet-controlled ([place])    followed by pcp   (11-26-2021  per pt last A1c by pcp on 10-30-2021 7.8,  stated does not check blood sugar)   Dyspnea    Fibrocystic breast disease    GAD (generalized anxiety disorder)    GERD (gastroesophageal reflux disease)    Hepatitis B surface antigen positive 11/2017   pt stated asymptomatic ,  no treatment   History of cervical dysplasia 04/10/2011   '93- once dx.-got pregnant-no intervention, then postpartum, no dysplasia found   History of condyloma acuminatum 04/09/2011   Hyperlipidemia    Hypertension    Incomplete left bundle branch block (LBBB)    Lung nodule 11/2017   pulmonologist--- dr Valeta Harms;  RUL and RLL   Muscle spasm of both lower legs    chronic , due to back issues,   OA (osteoarthritis) 04/10/2011   hips, shoulders, back    OSA (obstructive sleep apnea) 2009   study on epic 09-18-2007 mild osa w/ hypoxmia ;   (11-26-2021 per pt last used cpap 01/ 2022 before bariatric surgery 04/ 2022 stated lost wt / and stopped needing supplemental oxygen , did not need anymore)   Polyneuropathy    due to back issues and DM   Polysubstance abuse ([place])    11-26-2021  pt hx cocaine use stated none for yrs but ED visit 08-14-2021  positive UDS,  pt stated it was once only for depression none since   PSVT (paroxysmal supraventricular tachycardia)    followed by cardiology   Spinal stenosis, unspecified spinal region    Urge incontinence of urine    Wears dentures    upper   Past Surgical History:  Procedure Laterality Date   ANAL FISTULECTOMY  04/17/2011   Procedure: FISTULECTOMY ANAL;  Surgeon: Imogene Burn. [name], MD;  [facility]: [facility];  Service: General;  Laterality: N/A;  Excision of Condyloma Gluteal Cleft    ANAL RECTAL MANOMETRY N/A 03/26/2019   Procedure: ANO RECTAL MANOMETRY;  Surgeon: Thornton Park, MD;  [facility];  Service: Gastroenterology;  Laterality: N/A;   BIOPSY  03/09/2019   Procedure: BIOPSY;  Surgeon: Thornton Park, MD;  [facility];  Service: Gastroenterology;;   BOTOX INJECTION N/A 12/29/2018   Procedure: BOTOX INJECTION(100 UNITS)  WITH CYSTOSCOPY;  Surgeon: Ceasar Mons, MD;  [facility];  Service: Urology;  Laterality: N/A;   BOTOX INJECTION N/A 07/27/2019   Procedure: BOTOX INJECTION WITH CYSTOSCOPY;  Surgeon: Ceasar Mons, MD;  [facility];  Service: Urology;  Laterality: N/A;  ONLY NEEDS 30 MIN   BOTOX INJECTION N/A 05/02/2021   Procedure: BOTOX INJECTION WITH CYSTOSCOPY;  Surgeon: Ceasar Mons, MD;  Location: [facility];  Service: Urology;  Laterality: N/A;  ONLY NEEDS 30 MIN   BOTOX INJECTION N/A 11/28/2021   Procedure: BOTOX INJECTION WITH CYSTOSCOPY, 200 UNITS;  Surgeon: Ceasar Mons, MD;   Location: [facility];  Service: Urology;  Laterality: N/A;  ONLY NEEDS 30 MIN   BREAST EXCISIONAL BIOPSY Right    x 3   BREAST EXCISIONAL BIOPSY Left    12-29-2000  and 11-30-2001 '@[facility]  by dr Marlou Starks   CARPAL TUNNEL RELEASE Bilateral    right 08-30-2002 and left 07-04-2003  by dr Louanne Skye @ [facility]   CATARACT EXTRACTION W/ INTRAOCULAR LENS IMPLANT Bilateral 12/2020   CERVICAL FUSION  04/10/2011   x3- cervical fusion with plating and screws-Dr. [name]   COLONOSCOPY WITH PROPOFOL N/A 07/06/2015   Procedure: COLONOSCOPY WITH PROPOFOL;  Surgeon: [name], MD;  [facility];  Service: Endoscopy;  Laterality: N/A;   COLONOSCOPY WITH PROPOFOL N/A 03/09/2019   Procedure: COLONOSCOPY WITH PROPOFOL;  Surgeon: Thornton Park, MD;  [facility];  Service: Gastroenterology;  Laterality: N/A;   FRACTURE SURGERY     little toe left foot   I & D EXTREMITY Left 12/18/2016   Procedure: IRRIGATION AND DEBRIDEMENT LEFT FOOT, CLOSURE;  Surgeon: [name], MD;  Location: [place];  Service: Orthopedics;  Laterality: Left;   INCISION AND DRAINAGE ABSCESS Right 05/03/2002   '[facility]  by dr Marlou Starks;   right breast abscess   INJECTION KNEE Right 10/03/2020   Procedure: KNEE INJECTION;  Surgeon: Jessy Oto, MD;  Location: [place];  Service: Orthopedics;  Laterality: Right;   KNEE ARTHROSCOPY WITH MEDIAL MENISECTOMY Right 03/21/2021   Procedure: RIGHT KNEE ARTHROSCOPY WITH MEDIAL AND PARTIAL LATERAL MENISCECTOMY;  Surgeon: [name], MD;  Location: [place];  Service: Orthopedics;  Laterality: Right;   LAPAROSCOPIC GASTRIC SLEEVE RESECTION N/A 05/08/2020   Procedure: LAPAROSCOPIC GASTRIC SLEEVE RESECTION;  Surgeon: Kieth Brightly Arta Bruce, MD;  [facility];  Service: General;  Laterality: N/A;   [place] SURGERY  04/10/2011   x5-Lumbar fusion-retained hardware.(Dr. Louanne Skye)   LUMBAR LAMINECTOMY N/A 10/03/2020   Procedure: THORACOLUMBAR LAMINECTOMIES THORACIC  ELEVEN-TWELVE, THORACIC TWELVE-LUMBAR ONE  AND LUMBAT ONE-TWO;  Surgeon: Jessy Oto, MD;  Location: [place];  Service: Orthopedics;  Laterality: N/A;   LUMBAR PERCUTANEOUS PEDICLE SCREW 2 LEVEL  06/29/2021   Procedure: LUMBAR PERCUTANEOUS PEDICLE SCREW LUMBAR ONE-TWO;  Surgeon: Earnie Larsson, MD;  Location: [place];  Service: Neurosurgery;;   MULTIPLE TOOTH EXTRACTIONS     POLYPECTOMY  03/09/2019   Procedure: POLYPECTOMY;  Surgeon: Thornton Park, MD;  [facility];  Service: Gastroenterology;;   POSTERIOR LUMBAR FUSION  04/04/2005   '[facility]  by dr Louanne Skye;   L3-- L5   POSTERIOR LUMBAR FUSION  03/04/2006   '@[facility]  dr Louanne Skye;   fusion L2--3 and redo L3--5   SHOULDER ARTHROSCOPY WITH ROTATOR CUFF REPAIR AND SUBACROMIAL DECOMPRESSION Left 05/07/2019   Procedure: LEFT SHOULDER ARTHROSCOPY WITH DEBRIDEMENT, DISTAL CLAVICLE EXCISION,  SUBACROMIAL DECOMPRESSION AND POSSIBLE ROTATOR CUFF REPAIR;  Surgeon: [name], MD;  Location: [place];  Service: Orthopedics;  Laterality: Left;   TEE WITHOUT CARDIOVERSION N/A 04/06/2012   Procedure: TRANSESOPHAGEAL ECHOCARDIOGRAM (TEE);  Surgeon: [name], MD;  Location: [facility];  Service: Cardiovascular;  Laterality: N/A;   TRIGGER FINGER RELEASE Right    middle finger   TRIGGER FINGER RELEASE Left 06/28/2016   Procedure: RELEASE TRIGGER FINGER LEFT 3RD FINGER;  Surgeon: [name], MD;  Location: [place];  Service: Orthopedics;  Laterality: Left;   TRIGGER FINGER RELEASE Right 06/12/2016   Procedure: RIGHT INDEX FINGER TRIGGER RELEASE;  Surgeon: [name], MD;  Location: [place];  Service: Orthopedics;  Laterality: Right;   TRIGGER FINGER RELEASE Right 01/19/2018   Procedure: RIGHT RING FINGER TRIGGER FINGER RELEASE;  Surgeon: [name], MD;  Location: [place];  Service: Orthopedics;  Laterality: Right;   TRIGGER FINGER RELEASE Left 05/07/2019   Procedure: RELEASE TRIGGER FINGER LEFT INDEX FINGER;  Surgeon: [name], MD;  Location: [place];   Service: Orthopedics;  Laterality: Left;   UPPER GI ENDOSCOPY N/A 05/08/2020   Procedure: UPPER GI ENDOSCOPY;  Surgeon: Kieth Brightly Arta Bruce, MD;  [facility];  Service: General;  Laterality: N/A;   Patient Active Problem List   Diagnosis Date Noted   Panniculitis 12/04/2021   Infection of lip 08/27/2021   Lumbar stenosis with neurogenic claudication 06/29/2021   Lumbar radiculopathy 06/29/2021   Biceps tendinopathy of right upper extremity 05/03/2021   Irregular heartbeat 04/11/2021   Neuropathic pain of foot 03/28/2021   Chronic congestion of paranasal sinus 03/28/2021   Impingement syndrome of right shoulder 03/08/2021   Arthritis of right acromioclavicular joint 03/08/2021   Acute medial meniscus tear of right knee 03/08/2021   Acute lateral meniscus tear of right knee 03/08/2021   Spinal stenosis of thoracic region with radiculopathy    Bilateral leg numbness    Spinal stenosis of lumbar region with neurogenic claudication    Primary osteoarthritis of right knee    Impaired ambulation 01/10/2021   Status post lumbar laminectomy 10/03/2020   Cubital tunnel syndrome on left 05/02/2020   Vaginal discharge 01/20/2020   Prolapse of anterior vaginal wall 11/10/2019   Skin ulcer of left thigh, limited to breakdown of skin ([place]) 09/09/2019   Ulnar nerve compression 08/06/2019   Diabetic neuropathy associated with type 2 diabetes mellitus ([place]) 08/06/2019   Body mass index 50.0-59.9, adult ([place]) 06/12/2019   Impingement syndrome of left shoulder 05/07/2019   Polyp of colon    Polyp of ascending colon    Rectal discomfort 02/12/2019   Osteoarthritis of right hip 02/12/2019   Nontraumatic tear of left supraspinatus tendon 12/30/2018   Gout 12/17/2018   Nontraumatic complete tear of right rotator cuff 12/08/2018   Rotator cuff tear, right 12/01/2018   Tobacco abuse 08/21/2018   Right groin pain 07/20/2018   Depressed mood 07/13/2018   Paroxysmal SVT (supraventricular  tachycardia) 03/20/2018   Pure hypercholesterolemia 03/20/2018   Muscle cramping 02/25/2018   Nodule of upper lobe of right lung 02/13/2018   Falls, subsequent encounter 01/30/2018   Pain in left foot 11/04/2017   Essential hypertension 10/27/2017   Solitary pulmonary nodule 10/07/2017   Restrictive lung disease secondary to obesity 10/01/2017   Polycythemia, secondary 10/01/2017   Chronic viral hepatitis B without delta-agent ([place]) 07/08/2017   COPD (chronic obstructive pulmonary disease) with chronic bronchitis 06/27/2017   Chronic kidney disease (CKD), stage IV (severe) ([place]) 01/14/2017   Decubitus ulcer 12/13/2016   Type 2 diabetes mellitus with stage 3 chronic kidney  disease, with long-term current use of insulin ([place]) 12/12/2016   Urge incontinence of urine 12/05/2016   Trigger finger, left index finger 07/15/2016   Back pain 04/07/2014   Morbid obesity ([place]) 10/02/2012   Chronic diastolic CHF (congestive heart failure) ([place]) 04/07/2012   Skin lesion of scalp 03/15/2011   GERD 01/26/2010   Obstructive sleep apnea treated with BiPAP 02/03/2008   FIBROCYSTIC BREAST DISEASE 10/28/2006   Hyperlipidemia 03/27/2006   Obesity hypoventilation syndrome ([place]) 03/27/2006   Depression with anxiety 03/27/2006    REFERRING DIAG: chronic low back   THERAPY DIAG:  No diagnosis found.  Rationale for Evaluation and Treatment Rehabilitation  PERTINENT HISTORY: The patient has multiple medical conditions that would contribute to very slow and poor healing after surgery. She also has a history of not healing well. She will have to be 3 months tobacco free, hemoglobin A1c less than 6 and down to her desired weight less than 200 pounds in order to do the surgery. Would benefit from physical therapy and is willing to do it. I will put the order in. We are happy to see her back when she has completed the above tasks. Had back surgery on 6/2.  Wound dehissed.  No nurse visit or PT post surgery.   Asks for aid, Needs help with bath.  She would like PT and can go to outpatient.  Had previous good experience with [facility] [address]: None  SUBJECTIVE:                                                                                                                                                                                      SUBJECTIVE STATEMENT:  ***   PAIN:  Are you having pain? Yes: NPRS scale: ***/10 Pain location: B low back Pain description: ache Aggravating factors: movement Relieving factors: rest   OBJECTIVE: (objective measures completed at initial evaluation unless otherwise dated)   DIAGNOSTIC FINDINGS:  IMPRESSION: 1. Left foraminal stenosis C2-3 secondary to uncovertebral and facet DJD. Mild disc bulge. 2. Solid-appearing fusion C3-T2 as detailed above. 3. Large extrusion T2-3 extending inferiorly, with anterior cord flattening. 4. Smaller multilevel disc protrusions T3-8, largest at T6-7 with anterior cord flattening. 5. Moderate to severe spinal stenosis L1-2 despite previous posterior decompression. 6. Solid-appearing fusion L2-L5.   PATIENT SURVEYS:  Modified Oswestry 41/50    SCREENING FOR RED FLAGS: negative   COGNITION: Overall cognitive status: Within functional limits for tasks assessed                          SENSATION: Not tested   MUSCLE LENGTH:  Hamstrings: Right 90 deg; Left 90 deg(measured in sitting) Thomas test:    POSTURE: rounded shoulders, forward head, decreased lumbar lordosis, increased thoracic kyphosis, flexed trunk , and R lateral shift   PALPATION: Not tested   LUMBAR ROM:    AROM eval  Flexion 33%  Extension 10%  Right lateral flexion    Left lateral flexion    Right rotation 25%  Left rotation 25%   (Blank rows = not tested)   LOWER EXTREMITY ROM:      Passive  Right eval Left eval  Hip flexion 120d 120d  Hip extension      Hip abduction      Hip adduction      Hip internal  rotation      Hip external rotation      Knee flexion [facility] WFL  Knee extension 0d 0d  Ankle dorsiflexion      Ankle plantarflexion      Ankle inversion      Ankle eversion       (Blank rows = not tested)   LOWER EXTREMITY MMT:     MMT Right eval Left eval  Hip flexion 4 4  Hip extension 4 4  Hip abduction      Hip adduction      Hip internal rotation      Hip external rotation      Knee flexion 4 4  Knee extension 4 4  Ankle dorsiflexion      Ankle plantarflexion 4 4  Ankle inversion      Ankle eversion      Core 3 3   (Blank rows = not tested)   LUMBAR SPECIAL TESTS:  Straight leg raise test: Negative and Slump test: Negative   FUNCTIONAL TESTS:  5 times sit to stand: 23s with UE support   GAIT: Distance walked: 75 ft x2 Assistive device utilized: Single point cane Level of assistance: Modified independence Comments: antalgic gait pattern   TODAY'S TREATMENT:  [facility] Adult PT Treatment:                                                DATE: 01/10/22 Aquatic therapy at [address] - therapeutic pool temp 92 degrees Pt enters building in wheelchair pushed by security  Treatment took place in water 3.8 to  4 ft 8 in.feet deep depending upon activity.  Pt entered and exited the pool via stair and handrails independently with step to pattern.    Patient entered water for aquatic therapy for first time and was introduced to principles and therapeutic effects of water as they ambulated and acclimated to pool.  Therapeutic Exercise: Walking forward/backwards/side stepping Runners stretch on bottom step x30" BIL Hamstring stretch on bottom step x30" BIL STS from bench x20 At edge of pool, pt performed LE exercise: Hip abd/add x20 BIL Hip ext/flex with knee straight x 20 BIL Marching hip flexion to knee extension 2x10 BIL Hamstring curl x20 BIL Squats 2x20 Heel raises 2x20 Toe raises 2x20 Sitting on bench in water: LAQ x1' Bicycle kicks  x1' Scissor kicks x1'  Pt requires the buoyancy of water for active assisted exercises with buoyancy supported for strengthening and AROM exercises. Hydrostatic pressure also supports joints by unweighting joint load by at least 50 % in 3-4 feet depth water. 80% in chest to neck deep water.  Water will provide assistance with movement using the current and laminar flow while the buoyancy reduces weight bearing. Pt requires the viscosity of the water for resistance with strengthening exercises.                                                                                                                                DATE: 12/07/21      PATIENT EDUCATION:  Education details: Discussed eval findings, rehab rationale and POC and patient is in agreement  Person educated: Patient Education method: Explanation Education comprehension: verbalized understanding and needs further education   HOME EXERCISE PROGRAM: Access Code: [email or web address] URL: [email or web address] Date: 12/07/2021 Prepared by: Sharlynn Oliphant   Exercises - Sit to Stand with Armchair  - 2 x daily - 5 x weekly - 1 sets - 5 reps - Sit to Stand  - 2 x daily - 5 x weekly - 1 sets - 5 reps - Sit to Stand Without Arm Support  - 2 x daily - 5 x weekly - 1 sets - 10 reps - 30s hold - Standing Heel Raise with Support  - 2 x daily - 5 x weekly - 1 sets - 10 reps   ASSESSMENT:   CLINICAL IMPRESSION: ***    OBJECTIVE IMPAIRMENTS: Abnormal gait, decreased activity tolerance, decreased balance, decreased endurance, decreased knowledge of condition, decreased mobility, difficulty walking, decreased ROM, decreased strength, increased fascial restrictions, postural dysfunction, obesity, and pain.    ACTIVITY LIMITATIONS: carrying, lifting, bending, sitting, standing, squatting, sleeping, stairs, transfers, and bed mobility   PERSONAL FACTORS: Age, Behavior pattern, Fitness, Past/current experiences, Time since onset of  injury/illness/exacerbation, and 6 prior back surgeries are also affecting patient's functional outcome.    REHAB POTENTIAL: Fair based on chronicity, prior surgical interventions and limited response to treatments to date.   CLINICAL DECISION MAKING: Evolving/moderate complexity   EVALUATION COMPLEXITY: Moderate     GOALS: Goals reviewed with patient? No   SHORT TERM GOALS: Target date: 12/21/2021   Patient to demonstrate independence in HEP  Baseline: [id number] Goal status: INITIAL     LONG TERM GOALS: Target date: 01/18/2022   Decrease 5x STS with UE support time to 20s Baseline: 23s Goal status: INITIAL   2.  Decrease ODI score to 33/50 for MCID Baseline: 41 Goal status: INITIAL   3.  Increase lumbar flexion ROM to 50% Baseline: 33% Goal status: INITIAL   4. Increase core strength to 3+/5            Baseline: 3/5            Goal Status: INITIAL     PLAN:   PT FREQUENCY: 1x/week   PT DURATION: 6 weeks   PLANNED INTERVENTIONS: Therapeutic exercises, Therapeutic activity, Neuromuscular re-education, Balance training, Gait training, Patient/Family education, Self Care, Joint mobilization, Stair training, Aquatic Therapy, Manual therapy, and Re-evaluation.   PLAN FOR NEXT SESSION: Aquatic therapy, core strength, LE strength, flexibility tasks,  HEP review and update   Mathis Dad, PT 01/10/2022, 7:26 AM

## 2022-01-11 ENCOUNTER — Ambulatory Visit: Payer: 59 | Admitting: Student

## 2022-01-14 ENCOUNTER — Ambulatory Visit: Payer: 59

## 2022-01-16 ENCOUNTER — Other Ambulatory Visit: Payer: Self-pay | Admitting: Student

## 2022-01-16 DIAGNOSIS — R252 Cramp and spasm: Secondary | ICD-10-CM

## 2022-01-18 ENCOUNTER — Telehealth: Payer: Self-pay

## 2022-01-18 ENCOUNTER — Other Ambulatory Visit: Payer: Self-pay | Admitting: Student

## 2022-01-18 DIAGNOSIS — E1142 Type 2 diabetes mellitus with diabetic polyneuropathy: Secondary | ICD-10-CM

## 2022-01-18 MED ORDER — GABAPENTIN 300 MG PO CAPS: ORAL_CAPSULE | ORAL | 2 refills | Status: DC

## 2022-01-18 NOTE — Telephone Encounter (Signed)
Received phone call from pharmacist regarding gabapentin rx. They are requesting refill for 30 or 90 day supply. Last refill was sent for quantity of 30, which was a seven day supply.   If appropriate, please update quantity to either 30 or 90 day supply. Patient is taking 2 tablets in the morning and afternoon and 3 tablet in the evening.   Forwarding request to PCP.   Talbot Grumbling, RN

## 2022-01-22 ENCOUNTER — Other Ambulatory Visit: Payer: Self-pay | Admitting: Student

## 2022-01-31 ENCOUNTER — Other Ambulatory Visit: Payer: Self-pay | Admitting: Family Medicine

## 2022-01-31 DIAGNOSIS — Z1231 Encounter for screening mammogram for malignant neoplasm of breast: Secondary | ICD-10-CM

## 2022-02-01 ENCOUNTER — Other Ambulatory Visit: Payer: Self-pay | Admitting: Neurosurgery

## 2022-02-01 DIAGNOSIS — M48062 Spinal stenosis, lumbar region with neurogenic claudication: Secondary | ICD-10-CM

## 2022-02-04 ENCOUNTER — Ambulatory Visit: Payer: 59 | Admitting: Student

## 2022-02-06 ENCOUNTER — Ambulatory Visit: Payer: 59 | Admitting: Pulmonary Disease

## 2022-02-07 ENCOUNTER — Other Ambulatory Visit: Payer: 59

## 2022-02-07 ENCOUNTER — Ambulatory Visit: Payer: 59 | Admitting: Pharmacist

## 2022-02-08 ENCOUNTER — Ambulatory Visit: Payer: 59 | Admitting: Podiatry

## 2022-02-08 ENCOUNTER — Ambulatory Visit: Payer: 59 | Admitting: Student

## 2022-02-12 ENCOUNTER — Ambulatory Visit: Payer: 59 | Admitting: Orthopaedic Surgery

## 2022-02-16 ENCOUNTER — Other Ambulatory Visit: Payer: 59

## 2022-02-18 ENCOUNTER — Other Ambulatory Visit: Payer: Self-pay | Admitting: Student

## 2022-02-20 ENCOUNTER — Ambulatory Visit: Payer: 59 | Admitting: Orthopaedic Surgery

## 2022-02-23 ENCOUNTER — Other Ambulatory Visit: Payer: 59

## 2022-02-25 ENCOUNTER — Ambulatory Visit: Payer: 59 | Admitting: Pharmacist

## 2022-02-26 ENCOUNTER — Encounter: Payer: Self-pay | Admitting: Student

## 2022-02-26 ENCOUNTER — Ambulatory Visit (INDEPENDENT_AMBULATORY_CARE_PROVIDER_SITE_OTHER): Payer: 59 | Admitting: Student

## 2022-02-26 VITALS — BP 118/60 | HR 84 | Ht 69.0 in | Wt 280.4 lb

## 2022-02-26 DIAGNOSIS — I10 Essential (primary) hypertension: Secondary | ICD-10-CM | POA: Diagnosis not present

## 2022-02-26 NOTE — Assessment & Plan Note (Signed)
Blood pressure today is at goal 118/60.  Patient endorses compliance and tolerance to her losartan. -Obtained lab for BMP -Asked patient to continue her losartan 25 mg daily -Encourage patient to monitor home blood pressure

## 2022-02-26 NOTE — Patient Instructions (Signed)
It was wonderful to see you today. Thank you for allowing me to be a part of your care. Below is a short summary of what we discussed at your visit today:  Your blood pressure today is great 118/60.  Please continue to take your losartan as prescribed.  I also encouraged that you check your blood pressures at home.  Today we will obtain labs to check your electrolytes and kidney function.  Please bring all of your medications to every appointment!  If you have any questions or concerns, please do not hesitate to contact us via phone or MyChart message.   Alen Bleacher, MD [facility]

## 2022-02-26 NOTE — Progress Notes (Signed)
    SUBJECTIVE:   CHIEF COMPLAINT / HPI:   Patient is a 57 year old female presenting today for blood pressure follow-up.  Last visit about 2 months ago was started on losartan, was initially supposed to follow-up in 2 weeks for BMP check.  Patient said she's been unable to check her home BP. She denies any headache, vision changes or chest pain.   Back pain Back surgery June 2023 and that was her 6th back surgery.Recently started on Belbuca 645mg every 12hrs. This has provided some relieve but she still reported limited mobility especially in the morning. [facility]  PERTINENT  PMH / PSH: Reviewed   OBJECTIVE:   BP 118/60   Pulse 84   Ht '5\' 9"'$  (1.753 m)   Wt 280 lb 6.4 oz (127.2 kg)   LMP 01/29/1992   SpO2 96%   BMI 41.41 kg/m    Physical Exam General: Alert, well appearing, NAD Cardiovascular: RRR,Well perfused Respiratory: Normal WOB Extremities: +1 bilateral LE edema  ASSESSMENT/PLAN:   Essential hypertension Blood pressure today is at goal 118/60.  Patient endorses compliance and tolerance to her losartan. -Obtained lab for BMP -Asked patient to continue her losartan 25 mg daily -Encourage patient to monitor home blood pressure   Back pain Managed by orthopedic and patient report some improvement on new medication (Belbuca 6088m) -Added new medication to her med list -Encourage patient to follow up with her orthopedic appoint scheduled.   JoAlen BleacherMD [place]

## 2022-02-27 ENCOUNTER — Encounter: Payer: Self-pay | Admitting: Student

## 2022-02-27 LAB — BASIC METABOLIC PANEL
BUN/Creatinine Ratio: 10 (ref 9–23)
BUN: 10 mg/dL (ref 6–24)
CO2: 23 mmol/L (ref 20–29)
Calcium: 9.7 mg/dL (ref 8.7–10.2)
Chloride: 100 mmol/L (ref 96–106)
Creatinine, Ser: 0.99 mg/dL (ref 0.57–1.00)
Glucose: 122 mg/dL — ABNORMAL HIGH (ref 70–99)
Potassium: 4.3 mmol/L (ref 3.5–5.2)
Sodium: 142 mmol/L (ref 134–144)
eGFR: 66 mL/min/{1.73_m2} (ref >59)

## 2022-02-28 ENCOUNTER — Ambulatory Visit: Payer: 59 | Admitting: Pulmonary Disease

## 2022-03-01 ENCOUNTER — Other Ambulatory Visit: Payer: Self-pay | Admitting: Student

## 2022-03-13 ENCOUNTER — Other Ambulatory Visit: Payer: Self-pay | Admitting: Student

## 2022-03-13 DIAGNOSIS — E1142 Type 2 diabetes mellitus with diabetic polyneuropathy: Secondary | ICD-10-CM

## 2022-03-14 ENCOUNTER — Ambulatory Visit: Payer: 59 | Admitting: Pharmacist

## 2022-03-18 ENCOUNTER — Other Ambulatory Visit: Payer: Self-pay | Admitting: Student

## 2022-03-19 ENCOUNTER — Encounter: Payer: Self-pay | Admitting: Gastroenterology

## 2022-03-21 ENCOUNTER — Ambulatory Visit: Payer: 59 | Admitting: Orthopaedic Surgery

## 2022-03-22 ENCOUNTER — Ambulatory Visit: Payer: 59

## 2022-03-25 ENCOUNTER — Ambulatory Visit: Payer: 59 | Admitting: Podiatry

## 2022-03-26 ENCOUNTER — Other Ambulatory Visit: Payer: Self-pay | Admitting: Student

## 2022-03-26 ENCOUNTER — Telehealth: Payer: Self-pay | Admitting: Podiatry

## 2022-03-26 ENCOUNTER — Encounter: Payer: Self-pay | Admitting: Student

## 2022-03-26 DIAGNOSIS — K146 Glossodynia: Secondary | ICD-10-CM

## 2022-03-26 NOTE — Telephone Encounter (Signed)
Pt left message on 2.26 at 1216pm to cxl her appt on 2.26 @ 3pm she has injured her back and cannot make appt.  I returned call and pt is wanting to call back to r/s

## 2022-03-27 ENCOUNTER — Telehealth: Payer: Self-pay | Admitting: Student

## 2022-03-27 NOTE — Telephone Encounter (Signed)
Contacted [name] to schedule their annual wellness visit. Appointment made for 04/02/2022.  Thank you,  [place] Direct dial  [phone]

## 2022-03-30 NOTE — Progress Notes (Unsigned)
I connected with  [name] on [id number] by a audio enabled telemedicine application and verified that I am speaking with the correct person using two identifiers.  Patient Location: Home  Provider Location: Home Office  I discussed the limitations of evaluation and management by telemedicine. The patient expressed understanding and agreed to proceed.  Subjective:   [name] is a 57 y.o. female who presents for Medicare Annual (Subsequent) preventive examination.  Review of Systems    Per HPI unless specifically indicated below. Cardiac Risk Factors include: Hypertension, CHF, and Hyperlipidemia.           Objective:       02/26/2022    2:07 PM 12/04/2021    2:13 PM 12/04/2021    1:35 PM  Vitals with BMI  Height '5\' 9"'$   '5\' 9"'$   Weight 280 lbs 6 oz  271 lbs 13 oz  BMI [id number]  123456  Systolic [id number]  Diastolic 60 92 [id number]  Pulse 84 80 102    Today's Vitals   04/02/22 1012  PainSc: 8    There is no height or weight on file to calculate BMI.     04/02/2022   10:17 AM 02/26/2022    2:06 PM 11/28/2021   10:31 AM 10/30/2021    9:37 AM 08/27/2021    9:02 AM 08/13/2021    6:52 PM 05/09/2021    4:44 PM  Advanced Directives  Does Patient Have a Medical Advance Directive? No No Yes No Yes No Yes  Type of Advance Directive   Healthcare Power of [place]    Does patient want to make changes to medical advance directive?   No - Patient declined      Copy of [place] in Chart?   No - copy requested  Yes - validated most recent copy scanned in chart (See row information)    Would patient like information on creating a medical advance directive? No - Patient declined No - Patient declined  No - Patient declined       Current Medications (verified) Outpatient Encounter Medications as of 04/02/2022  Medication Sig   ACCU-CHEK AVIVA PLUS test strip 1 EACH BY OTHER ROUTE 3 (THREE) TIMES DAILY. TEST 3 TIMES DAILY    acetaminophen (TYLENOL) 500 MG tablet Take 1,000 mg by mouth 2 (two) times daily.   albuterol (VENTOLIN HFA) 108 (90 Base) MCG/ACT inhaler TAKE 2 PUFFS BY MOUTH EVERY 6 HOURS AS NEEDED FOR WHEEZE OR SHORTNESS OF BREATH Strength: 108 (90 Base) MCG/ACT (Patient taking differently: Inhale 1-2 puffs into the lungs every 6 (six) hours as needed. TAKE 2 PUFFS BY MOUTH EVERY 6 HOURS AS NEEDED FOR WHEEZE OR SHORTNESS OF BREATH Strength: 108 (90 Base) MCG/ACT)   ammonium lactate (LAC-HYDRIN) 12 % lotion For dry skin (Patient taking differently: Apply 1 Application topically 2 (two) times daily. For dry skin)   atorvastatin (LIPITOR) 40 MG tablet TAKE ONE TABLET BY MOUTH EVERY DAY (Patient taking differently: Take 40 mg by mouth daily.)   baclofen (LIORESAL) 10 MG tablet Take 1.5 tablets (15 mg total) by mouth 3 (three) times daily.   BELBUCA 900 MCG FILM Place inside cheek.   busPIRone (BUSPAR) 10 MG tablet TAKE ONE TABLET BY MOUTH THREE TIMES DAILY   calcium carbonate (OSCAL) 1500 (600 Ca) MG TABS tablet Take 1,500 mg by mouth 2 (two) times daily.   diclofenac Sodium (VOLTAREN) 1 % GEL APPLY FOUR grams  FOUR TIMES DAILY AS DIRECTED   diltiazem (CARDIZEM CD) 120 MG 24 hr capsule Take 1 capsule (120 mg total) by mouth 2 (two) times daily.   fluticasone (FLONASE) 50 MCG/ACT nasal spray INSTILL TWO SPRAYS INTO BOTH NOSTRILS TWICE DAILY (Patient taking differently: Place 2 sprays into both nostrils 2 (two) times daily. INSTILL TWO SPRAYS INTO BOTH NOSTRILS TWICE DAILY)   gabapentin (NEURONTIN) 300 MG capsule TAKE TWO CAPSULES BY MOUTH IN THE MORNING AND AT LUNCH AND TAKE THREE CAPSULES AT NIGHT   hydrOXYzine (ATARAX) 10 MG tablet TAKE ONE TABLET BY MOUTH EVERY DAY   ketoconazole (NIZORAL) 2 % cream Apply 1 Application topically daily.   linaclotide (LINZESS) 72 MCG capsule Take 72 mcg by mouth daily before breakfast.   loratadine (CLARITIN) 10 MG tablet Take 1 tablet (10 mg total) by mouth daily.   losartan  (COZAAR) 25 MG tablet Take 1 tablet (25 mg total) by mouth at bedtime.   metolazone (ZAROXOLYN) 2.5 MG tablet Take 1 tablet (2.5 mg total) by mouth 2 (two) times a week. Mondays and Thursdays (Patient taking differently: Take 2.5 mg by mouth 2 (two) times a week. Mondays and Thursdays)   montelukast (SINGULAIR) 10 MG tablet TAKE 1 TABLET BY MOUTH EVERYDAY AT BEDTIME Strength: 10 mg (Patient taking differently: Take 10 mg by mouth at bedtime. TAKE 1 TABLET BY MOUTH EVERYDAY AT BEDTIME Strength: 10 mg)   Multiple Vitamins-Minerals (BARIATRIC MULTIVITAMINS/IRON) CAPS Take 1 capsule by mouth 2 (two) times daily.   NARCAN 4 MG/0.1ML LIQD nasal spray kit Place 0.4 mg into the nose once as needed (accidental overdose).   nystatin (MYCOSTATIN) 100000 UNIT/ML suspension Take 5 mLs (500,000 Units total) by mouth 4 (four) times daily. Swish and swallow.   omeprazole (PRILOSEC) 20 MG capsule Take 1 capsule (20 mg total) by mouth daily.   potassium chloride SA (KLOR-CON M) 20 MEQ tablet Take 20 mEq by mouth daily.   TRELEGY ELLIPTA 100-62.5-25 MCG/ACT AEPB Inhale 1 puff into the lungs daily.   venlafaxine XR (EFFEXOR-XR) 75 MG 24 hr capsule Take 1 capsule (75 mg total) by mouth daily with breakfast.   [DISCONTINUED] Buprenorphine HCl (BELBUCA) 600 MCG FILM Place 1 Film inside cheek in the morning and at bedtime. (Patient not taking: Reported on 04/02/2022)   [DISCONTINUED] diclofenac Sodium (VOLTAREN) 1 % GEL APPLY FOUR grams FOUR TIMES DAILY AS DIRECTED   No facility-administered encounter medications on file as of 04/02/2022.    Allergies (verified) Aspirin, Methadone hcl, and Levofloxacin   History: Past Medical History:  Diagnosis Date   Alcoholism in remission ([place])    11-26-2021  pt stated no alcohol few yrs ago   Asthma    Bipolar disorder ([place])    Chronic diastolic CHF (congestive heart failure) ([place]) 2014   followed by dr b. Harrell Gave;   Chronic hypoxemic respiratory failure [facility])     pulmonologist--- dr Valeta Harms;   (11-26-2021 per pt was on supplemental oxygen until she 01/ 2022 did not need it,  does not check O2 sats at home anymore.   CKD (chronic kidney disease), stage III [facility])    nephrologist--- dr Carolin Sicks   COPD (chronic obstructive pulmonary disease) ([place])    11-26-2021 pt trying to quit smoking ,  started age 61   Depression    Diabetes mellitus type 2, diet-controlled ([place])    followed by pcp   (11-26-2021  per pt last A1c by pcp on 10-30-2021 7.8,  stated does not check blood sugar)  Dyspnea    Fibrocystic breast disease    GAD (generalized anxiety disorder)    GERD (gastroesophageal reflux disease)    Hepatitis B surface antigen positive 11/2017   pt stated asymptomatic ,  no treatment   History of cervical dysplasia 04/10/2011   '93- once dx.-got pregnant-no intervention, then postpartum, no dysplasia found   History of condyloma acuminatum 04/09/2011   Hyperlipidemia    Hypertension    Incomplete left bundle branch block (LBBB)    Lung nodule 11/2017   pulmonologist--- dr Valeta Harms;  RUL and RLL   Muscle spasm of both lower legs    chronic , due to back issues,   OA (osteoarthritis) 04/10/2011   hips, shoulders, back   OSA (obstructive sleep apnea) 2009   study on epic 09-18-2007 mild osa w/ hypoxmia ;   (11-26-2021 per pt last used cpap 01/ 2022 before bariatric surgery 04/ 2022 stated lost wt / and stopped needing supplemental oxygen , did not need anymore)   Polyneuropathy    due to back issues and DM   Polysubstance abuse ([place])    11-26-2021  pt hx cocaine use stated none for yrs but ED visit 08-14-2021  positive UDS,  pt stated it was once only for depression none since   PSVT (paroxysmal supraventricular tachycardia)    followed by cardiology   Spinal stenosis, unspecified spinal region    Urge incontinence of urine    Wears dentures    upper   Past Surgical History:  Procedure Laterality Date   ANAL FISTULECTOMY  04/17/2011   Procedure:  FISTULECTOMY ANAL;  Surgeon: Imogene Burn. [name], MD;  [facility];  Service: General;  Laterality: N/A;  Excision of Condyloma Gluteal Cleft    ANAL RECTAL MANOMETRY N/A 03/26/2019   Procedure: ANO RECTAL MANOMETRY;  Surgeon: Thornton Park, MD;  [facility];  Service: Gastroenterology;  Laterality: N/A;   BIOPSY  03/09/2019   Procedure: BIOPSY;  Surgeon: Thornton Park, MD;  [facility];  Service: Gastroenterology;;   BOTOX INJECTION N/A 12/29/2018   Procedure: BOTOX INJECTION(100 UNITS)  WITH CYSTOSCOPY;  Surgeon: Ceasar Mons, MD;  [facility];  Service: Urology;  Laterality: N/A;   BOTOX INJECTION N/A 07/27/2019   Procedure: BOTOX INJECTION WITH CYSTOSCOPY;  Surgeon: Ceasar Mons, MD;  [facility];  Service: Urology;  Laterality: N/A;  ONLY NEEDS 30 MIN   BOTOX INJECTION N/A 05/02/2021   Procedure: BOTOX INJECTION WITH CYSTOSCOPY;  Surgeon: Ceasar Mons, MD;  [facility]  Service: Urology;  Laterality: N/A;  ONLY NEEDS 30 MIN   BOTOX INJECTION N/A 11/28/2021   Procedure: BOTOX INJECTION WITH CYSTOSCOPY, 200 UNITS;  Surgeon: Ceasar Mons, MD;  Location: [facility];  Service: Urology;  Laterality: N/A;  ONLY NEEDS 30 MIN   BREAST EXCISIONAL BIOPSY Right    x 3   BREAST EXCISIONAL BIOPSY Left    12-29-2000  and 11-30-2001 '@[facility]  by dr Marlou Starks   CARPAL TUNNEL RELEASE Bilateral    right 08-30-2002 and left 07-04-2003  by dr Louanne Skye @ [facility]   CATARACT EXTRACTION W/ INTRAOCULAR LENS IMPLANT Bilateral 12/2020   CERVICAL FUSION  04/10/2011   x3- cervical fusion with plating and screws-Dr. [name]   COLONOSCOPY WITH PROPOFOL N/A 07/06/2015   Procedure: COLONOSCOPY WITH PROPOFOL;  Surgeon: [name], MD;  [facility];  Service: Endoscopy;  Laterality: N/A;   COLONOSCOPY WITH PROPOFOL N/A 03/09/2019   Procedure: COLONOSCOPY WITH PROPOFOL;  Surgeon: Thornton Park,  MD;  Location: [facility];  Service: Gastroenterology;  Laterality: N/A;   FRACTURE SURGERY     little toe left foot   I & D EXTREMITY Left 12/18/2016   Procedure: IRRIGATION AND DEBRIDEMENT LEFT FOOT, CLOSURE;  Surgeon: [name], MD;  Location: [place];  Service: Orthopedics;  Laterality: Left;   INCISION AND DRAINAGE ABSCESS Right 05/03/2002   '[facility]  by dr Marlou Starks;   right breast abscess   INJECTION KNEE Right 10/03/2020   Procedure: KNEE INJECTION;  Surgeon: Jessy Oto, MD;  Location: [place];  Service: Orthopedics;  Laterality: Right;   KNEE ARTHROSCOPY WITH MEDIAL MENISECTOMY Right 03/21/2021   Procedure: RIGHT KNEE ARTHROSCOPY WITH MEDIAL AND PARTIAL LATERAL MENISCECTOMY;  Surgeon: [name], MD;  Location: [place];  Service: Orthopedics;  Laterality: Right;   LAPAROSCOPIC GASTRIC SLEEVE RESECTION N/A 05/08/2020   Procedure: LAPAROSCOPIC GASTRIC SLEEVE RESECTION;  Surgeon: Kieth Brightly Arta Bruce, MD;  [facility];  Service: General;  Laterality: N/A;   [place] SURGERY  04/10/2011   x5-Lumbar fusion-retained hardware.(Dr. Louanne Skye)   LUMBAR LAMINECTOMY N/A 10/03/2020   Procedure: THORACOLUMBAR LAMINECTOMIES THORACIC ELEVEN-TWELVE, THORACIC TWELVE-LUMBAR ONE  AND LUMBAT ONE-TWO;  Surgeon: Jessy Oto, MD;  Location: [place];  Service: Orthopedics;  Laterality: N/A;   LUMBAR PERCUTANEOUS PEDICLE SCREW 2 LEVEL  06/29/2021   Procedure: LUMBAR PERCUTANEOUS PEDICLE SCREW LUMBAR ONE-TWO;  Surgeon: Earnie Larsson, MD;  Location: [place];  Service: Neurosurgery;;   MULTIPLE TOOTH EXTRACTIONS     POLYPECTOMY  03/09/2019   Procedure: POLYPECTOMY;  Surgeon: Thornton Park, MD;  [facility];  Service: Gastroenterology;;   POSTERIOR LUMBAR FUSION  04/04/2005   '@[facility]  by dr Louanne Skye;   L3-- L5   POSTERIOR LUMBAR FUSION  03/04/2006   '@[facility]  dr Louanne Skye;   fusion L2--3 and redo L3--5   SHOULDER ARTHROSCOPY WITH ROTATOR CUFF REPAIR AND SUBACROMIAL DECOMPRESSION Left 05/07/2019    Procedure: LEFT SHOULDER ARTHROSCOPY WITH DEBRIDEMENT, DISTAL CLAVICLE EXCISION,  SUBACROMIAL DECOMPRESSION AND POSSIBLE ROTATOR CUFF REPAIR;  Surgeon: [name], MD;  Location: [place];  Service: Orthopedics;  Laterality: Left;   TEE WITHOUT CARDIOVERSION N/A 04/06/2012   Procedure: TRANSESOPHAGEAL ECHOCARDIOGRAM (TEE);  Surgeon: [name], MD;  [facility];  Service: Cardiovascular;  Laterality: N/A;   TRIGGER FINGER RELEASE Right    middle finger   TRIGGER FINGER RELEASE Left 06/28/2016   Procedure: RELEASE TRIGGER FINGER LEFT 3RD FINGER;  Surgeon: [name], MD;  Location: [place];  Service: Orthopedics;  Laterality: Left;   TRIGGER FINGER RELEASE Right 06/12/2016   Procedure: RIGHT INDEX FINGER TRIGGER RELEASE;  Surgeon: [name], MD;  Location: [place];  Service: Orthopedics;  Laterality: Right;   TRIGGER FINGER RELEASE Right 01/19/2018   Procedure: RIGHT RING FINGER TRIGGER FINGER RELEASE;  Surgeon: [name], MD;  Location: [place];  Service: Orthopedics;  Laterality: Right;   TRIGGER FINGER RELEASE Left 05/07/2019   Procedure: RELEASE TRIGGER FINGER LEFT INDEX FINGER;  Surgeon: [name], MD;  Location: [place];  Service: Orthopedics;  Laterality: Left;   UPPER GI ENDOSCOPY N/A 05/08/2020   Procedure: UPPER GI ENDOSCOPY;  Surgeon: Kieth Brightly, Arta Bruce, MD;  [facility];  Service: General;  Laterality: N/A;   Family History  Problem Relation Age of Onset   Pulmonary embolism Mother    Stroke Father    Alcohol abuse Father    Pneumonia Father    Hypertension  Father    Breast cancer Maternal Aunt    Hypertension Other    Diabetes Other        Aunts and cousins   Asthma Sister    Depression Sister    Arthritis Sister    Sickle cell anemia Son    Social History   Socioeconomic History   Marital status: Divorced    Spouse name: Not on file   Number of children: 1   Years of education: 12   Highest education level: 12th grade   Occupational History   Occupation: disabled    Comment: Disabled  Tobacco Use   Smoking status: Every Day    Packs/day: 0.50    Years: 44.00    Total pack years: 22.00    Types: Cigarettes    Start date: 01/28/1977   Smokeless tobacco: Never  Vaping Use   Vaping Use: Never used  Substance and Sexual Activity   Alcohol use: Not Currently    Comment: hx alcoholism,  (11-26-2021 per pt no alcohol in a while)   Drug use: Not Currently    Comment: 11-26-2021  (hx cocaine use) positive UDS in epic 08-14-2021  pt stated only did it once due to depression 07/ 2023 none since;  per pt last marijuana yrs ago   Sexual activity: Not Currently    Partners: Male    Birth control/protection: Post-menopausal  Other Topics Concern   Not on file  Social History Narrative   Current Social History       Who lives at home: Patient lives alone in one level home; has 4 steps onto porch with a handrail. Has smoke alarms, no throw rugs. Has elevated toilet. Has shower bench in tub with grab rails.    Transportation: Patient has own vehicle and drives herself    Important Relationships "Family and friends"    Pets: None    Likes to eat varied diet. Seafood, fish, roasted [place] breast, vegetables and salads and fruits.   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 I'm a lovable person."                                                                                                   Social Determinants of Health   Financial Resource Strain: Low Risk  (04/02/2022)   Overall Financial Resource Strain (CARDIA)    Difficulty of Paying Living Expenses: Not hard at all  Food Insecurity: No Food Insecurity (04/02/2022)   Hunger Vital Sign    Worried About  Running Out of Food in the Last Year: Never true    Ran Out of Food in the Last Year: Never true  Transportation Needs: No Transportation Needs (04/02/2022)   PRAPARE - Transportation    Lack of  Transportation (Medical): No    Lack of Transportation (Non-Medical): No  Physical Activity: Inactive (04/02/2022)   Exercise Vital Sign    Days of Exercise per Week: 0 days    Minutes of Exercise per Session: 0 min  Stress: No Stress Concern Present (04/02/2022)   [place]    Feeling of Stress : Not at all  Social Connections: Socially Isolated (04/02/2022)   Social Connection and Isolation Panel [NHANES]    Frequency of Communication with Friends and Family: More than three times a week    Frequency of Social Gatherings with Friends and Family: More than three times a week    Attends Religious Services: Never    Marine scientist or Organizations: No    Attends Music therapist: Never    Marital Status: Divorced    Tobacco Counseling Ready to quit: Not Answered Counseling given: No   Clinical Intake:  Pre-visit preparation completed: No  Pain : 0-10 Pain Score: 8  Pain Type: Chronic pain Pain Location: Back Pain Orientation: Lower Pain Descriptors / Indicators: Aching, Stabbing, Nagging, Jabbing, Tingling Pain Onset: More than a month ago Pain Frequency: Constant     Nutritional Status: BMI > 30  Obese Nutritional Risks: None Diabetes: Yes CBG done?: No Did pt. bring in CBG monitor from home?: No     Diabetic?Nutrition Risk Assessment:  Has the patient had any N/V/D within the last 2 months?  No  Does the patient have any non-healing wounds?  No  Has the patient had any unintentional weight loss or weight gain?  No   Diabetes:  Is the patient diabetic?  Yes  If diabetic, was a CBG obtained today?  No  Did the patient bring in their glucometer from home?  No  How often do you monitor  your CBG's? never.   Financial Strains and Diabetes Management:  Are you having any financial strains with the device, your supplies or your medication? No .  Does the patient want to be seen by Chronic Care Management for management of their diabetes?  No  Would the patient like to be referred to a Nutritionist or for Diabetic Management?  No   Diabetic Exams:  Diabetic Eye Exam: Overdue for diabetic eye exam. Pt has been advised about the importance in completing this exam. Patient advised to call and schedule an eye exam. Diabetic Foot Exam: Completed 06/08/2021    Interpreter Needed?: No  Information entered by :: Donnie [place], [place]   Activities of Daily Living    04/02/2022   10:09 AM 11/28/2021   10:36 AM  In your present state of health, do you have any difficulty performing the following activities:  Hearing? 0 0  Vision? 1 0  Comment [place]   Difficulty concentrating or making decisions? 0 0  Walking or climbing stairs? 1 1  Dressing or bathing? 0 0  Doing errands, shopping? 1     Patient Care Team: Alen Bleacher, MD as PCP - General (Family Medicine) [name], MD as PCP - Cardiology (Cardiology) Rutherford Guys, MD as Consulting Physician (Ophthalmology) Renelda Mom, DMD (Dentistry) [name], MD as Consulting Physician (Cardiology) [name], MD as Attending Physician (Orthopedic Surgery) Gardiner Barefoot, DPM as Consulting Physician (Podiatry) Lovena Neighbours Conception Oms, MD as Consulting Physician (Urology) Jessy Oto, MD (Inactive) as Consulting Physician (Orthopedic Surgery) Star Age, MD as Attending Physician (Neurology) Rosita Fire, MD as Consulting Physician (Nephrology) June Leap  L, DO as Consulting Physician (Pulmonary Disease)  Indicate any recent Medical Services you may have received from other than [facility] providers in the past year (date may be approximate).     Assessment:   This is a routine  wellness examination for Kahlila.   Hearing/Vision screen Denies any hearing issues. Denies any change to her vision. Wear glasses. Annual Eye Exam Rutherford Guys, MD.   Dietary issues and exercise activities discussed: Current Exercise Habits: The patient does not participate in regular exercise at present, Exercise limited by: orthopedic condition(s)   Goals Addressed   None    Depression Screen    04/02/2022   10:08 AM 02/26/2022    2:07 PM 12/04/2021    1:36 PM 10/30/2021    9:37 AM 08/27/2021    9:03 AM 06/28/2021    9:10 AM 05/09/2021    3:57 PM  PHQ 2/9 Scores  PHQ - 2 Score 0 0 0 0 0 0 0  PHQ- 9 Score  0 0 0 0 0 0    Fall Risk    04/02/2022   10:09 AM 02/26/2022    2:08 PM 08/27/2021    9:03 AM 05/01/2021    8:42 AM 07/20/2020    2:41 PM  Fall Risk   Falls in the past year? '1 1 1 1 1  '$ Number falls in past yr: '1 1 1 '$ 0 1  Injury with Fall? 0  1 0 0  Risk for fall due to : Impaired balance/gait;Impaired mobility    Impaired balance/gait  Follow up Falls evaluation completed    Falls prevention discussed    FALL RISK PREVENTION PERTAINING TO THE HOME:  Any stairs in or around the home? No  If so, are there any without handrails? Yes  Home free of loose throw rugs in walkways, pet beds, electrical cords, etc? Yes  Adequate lighting in your home to reduce risk of falls? Yes   ASSISTIVE DEVICES UTILIZED TO PREVENT FALLS:  Life alert? Yes  Use of a cane, walker or w/c? Yes  Grab bars in the bathroom? Yes  Shower chair or bench in shower? Yes  Elevated toilet seat or a handicapped toilet? Yes   TIMED UP AND GO:  Was the test performed? Unable to perform, virtual appointment    Cognitive Function:    02/03/2018   10:35 AM  MMSE - Mini Mental State Exam  Orientation to time 5  Orientation to Place 5  Registration 3  Attention/ Calculation 5  Recall 3  Language- name 2 objects 2  Language- repeat 1  Language- follow 3 step command 3  Language- read & follow  direction 1  Write a sentence 1  Copy design 1  Total score 30        04/02/2022   10:10 AM 07/20/2020    2:44 PM 02/03/2018   10:35 AM  6CIT Screen  What Year? 0 points  0 points  What month? 0 points  0 points  What time? 0 points 0 points 0 points  Count back from 20 0 points 0 points 0 points  Months in reverse 0 points 0 points 0 points  Repeat phrase 0 points 0 points 0 points  Total Score 0 points  0 points    Immunizations Immunization History  Administered Date(s) Administered   Influenza Split 04/05/2012   Influenza Whole 02/24/2007, 10/23/2007, 12/06/2008, 01/16/2010   Influenza,inj,Quad PF,6+ Mos 11/29/2013, 09/29/2014, 11/23/2015, 11/22/2016, 10/07/2017, 09/18/2018, 01/09/2021, 01/10/2021, 10/30/2021   Influenza-Unspecified 11/11/2019  PFIZER Comirnaty(Gray Top)Covid-19 Tri-Sucrose Vaccine 06/28/2020   PFIZER(Purple Top)SARS-COV-2 Vaccination 04/20/2019, 05/11/2019, 01/11/2020   PNEUMOCOCCAL CONJUGATE-20 01/09/2021   Pfizer Covid-19 Vaccine Bivalent Booster 9yr & up 03/07/2021   Pneumococcal Conjugate-13 12/27/2016   Pneumococcal Polysaccharide-23 11/28/2001   Td 05/28/2004   Tdap 06/28/2020   Zoster Recombinat (Shingrix) 04/23/2021    TDAP status: Up to date  Flu Vaccine status: Up to date  Pneumococcal vaccine status: Up to date  Covid-19 vaccine status: Information provided on how to obtain vaccines.   Qualifies for Shingles Vaccine? Yes   Zostavax completed No   Shingrix Completed?: No.    Education has been provided regarding the importance of this vaccine. Patient has been advised to call insurance company to determine out of pocket expense if they have not yet received this vaccine. Advised may also receive vaccine at local pharmacy or Health Dept. Verbalized acceptance and understanding.  Screening Tests Health Maintenance  Topic Date Due   Diabetic kidney evaluation - Urine ACR  Never done   OPHTHALMOLOGY EXAM  03/17/2021   Zoster Vaccines-  Shingrix (2 of 2) 06/18/2021   COVID-19 Vaccine (6 - 2023-24 season) 09/28/2021   HEMOGLOBIN A1C  05/01/2022   FOOT EXAM  06/09/2022   MAMMOGRAM  01/31/2023   Diabetic kidney evaluation - eGFR measurement  02/27/2023   Medicare Annual Wellness (AWV)  04/02/2023   PAP SMEAR-Modifier  12/04/2024   COLONOSCOPY (Pts 45-473yrInsurance coverage will need to be confirmed)  03/08/2029   DTaP/Tdap/Td (3 - Td or Tdap) 06/29/2030   INFLUENZA VACCINE  Completed   Hepatitis C Screening  Completed   HIV Screening  Completed   HPV VACCINES  Aged Out   Lung Cancer Screening  Discontinued    Health Maintenance  Health Maintenance Due  Topic Date Due   Diabetic kidney evaluation - Urine ACR  Never done   OPHTHALMOLOGY EXAM  03/17/2021   Zoster Vaccines- Shingrix (2 of 2) 06/18/2021   COVID-19 Vaccine (6 - 2023-24 season) 09/28/2021    Colorectal cancer screening: Type of screening: Colonoscopy. Completed 03/09/2019. Repeat every 10 years  Mammogram status: Completed 01/30/2021, scheduled to repeat 05/03/2022. Repeat every year    Lung Cancer Screening: (Low Dose CT Chest recommended if Age 59-80ears, 30 pack-year currently smoking OR have quit w/in 15years.) does qualify.   Lung Cancer Screening Referral: pt refuse  Additional Screening:  Hepatitis C Screening: does qualify; Completed 06/02/2017  Vision Screening: Recommended annual ophthalmology exams for early detection of glaucoma and other disorders of the eye. Is the patient up to date with their annual eye exam?  Yes  Who is the provider or what is the name of the office in which the patient attends annual eye exams? [facility] If pt is not established with a provider, would they like to be referred to a provider to establish care? No .   Dental Screening: Recommended annual dental exams for proper oral hygiene  Community Resource Referral / Chronic Care Management: CRR required this visit?  No   CCM required this  visit?  No      Plan:     I have personally reviewed and noted the following in the patient's chart:   Medical and social history Use of alcohol, tobacco or illicit drugs  Current medications and supplements including opioid prescriptions. Patient is not currently taking opioid prescriptions. Functional ability and status Nutritional status Physical activity Advanced directives List of other physicians Hospitalizations, surgeries, and ER visits in previous  12 months Vitals Screenings to include cognitive, depression, and falls Referrals and appointments  In addition, I have reviewed and discussed with patient certain preventive protocols, quality metrics, and best practice recommendations. A written personalized care plan for preventive services as well as general preventive health recommendations were provided to patient.    Ms. Burgdorf , Thank you for taking time to come for your Medicare Wellness Visit. I appreciate your ongoing commitment to your health goals. Please review the following plan we discussed and let me know if I can assist you in the future.   These are the goals we discussed:  Goals       Get back to swimming daily at [facility]  once breathing under control      Weight (lb) < 346 lb (156.9 kg) (pt-stated)      7% weight loss        This is a list of the screening recommended for you and due dates:  Health Maintenance  Topic Date Due   Yearly kidney health urinalysis for diabetes  Never done   Eye exam for diabetics  03/17/2021   Zoster (Shingles) Vaccine (2 of 2) 06/18/2021   COVID-19 Vaccine (6 - 2023-24 season) 09/28/2021   Hemoglobin A1C  05/01/2022   Complete foot exam   06/09/2022   Mammogram  01/31/2023   Yearly kidney function blood test for diabetes  02/27/2023   Medicare Annual Wellness Visit  04/02/2023   Pap Smear  12/04/2024   Colon Cancer Screening  03/08/2029   DTaP/Tdap/Td vaccine (3 - Td or Tdap) 06/29/2030   Flu Shot  Completed    Hepatitis C Screening: USPSTF Recommendation to screen - Ages 18-79 yo.  Completed   HIV Screening  Completed   HPV Vaccine  Aged Out   Screening for Lung Cancer  Discontinued     Wilson Singer, [facility]   04/02/2022  Nurse Notes: Approximately 30 minute Non-Face -To-Face Medicare Wellness Visit

## 2022-03-30 NOTE — Patient Instructions (Signed)

## 2022-03-31 ENCOUNTER — Encounter: Payer: Self-pay | Admitting: Student

## 2022-04-01 MED ORDER — DICLOFENAC SODIUM 1 % EX GEL: CUTANEOUS | 1 refills | Status: DC

## 2022-04-02 ENCOUNTER — Ambulatory Visit (INDEPENDENT_AMBULATORY_CARE_PROVIDER_SITE_OTHER): Payer: 59

## 2022-04-02 ENCOUNTER — Ambulatory Visit: Payer: 59 | Admitting: Orthopaedic Surgery

## 2022-04-02 DIAGNOSIS — Z0000 Encounter for general adult medical examination without abnormal findings: Secondary | ICD-10-CM

## 2022-04-03 ENCOUNTER — Encounter: Payer: Self-pay | Admitting: Student

## 2022-04-15 ENCOUNTER — Other Ambulatory Visit: Payer: Self-pay | Admitting: Student

## 2022-04-23 LAB — HM DIABETES EYE EXAM

## 2022-05-01 ENCOUNTER — Other Ambulatory Visit: Payer: Self-pay | Admitting: Student

## 2022-05-01 DIAGNOSIS — E1142 Type 2 diabetes mellitus with diabetic polyneuropathy: Secondary | ICD-10-CM

## 2022-05-02 ENCOUNTER — Other Ambulatory Visit: Payer: Self-pay | Admitting: Student

## 2022-05-03 ENCOUNTER — Ambulatory Visit: Payer: 59

## 2022-05-03 ENCOUNTER — Ambulatory Visit: Payer: 59 | Admitting: Student

## 2022-05-06 ENCOUNTER — Ambulatory Visit: Payer: 59 | Admitting: Student

## 2022-05-07 ENCOUNTER — Other Ambulatory Visit: Payer: Self-pay | Admitting: Urology

## 2022-05-07 ENCOUNTER — Other Ambulatory Visit: Payer: Self-pay

## 2022-05-07 ENCOUNTER — Inpatient Hospital Stay: Admission: RE | Admit: 2022-05-07 | Payer: 59 | Source: Ambulatory Visit

## 2022-05-07 MED ORDER — HYDROXYZINE HCL 10 MG PO TABS: 10.0000 mg | ORAL_TABLET | Freq: Every day | ORAL | 1 refills | Status: DC

## 2022-05-09 ENCOUNTER — Ambulatory Visit: Payer: 59 | Admitting: Orthopaedic Surgery

## 2022-05-10 ENCOUNTER — Encounter (HOSPITAL_BASED_OUTPATIENT_CLINIC_OR_DEPARTMENT_OTHER): Payer: Self-pay | Admitting: Urology

## 2022-05-10 ENCOUNTER — Other Ambulatory Visit: Payer: Self-pay | Admitting: Student

## 2022-05-10 NOTE — Progress Notes (Addendum)
Spoke w/ via phone for pre-op interview--- pt Lab needs dos----  Mirant results------ current EKG in epic/ chart COVID test -----patient states asymptomatic no test needed Arrive at ------- 0800 on 05-15-2022 NPO after MN NO Solid Food.  Clear liquids from MN until--- 0700 Med rec completed Medications to take morning of surgery -----tylenol,  buspar, gabapentin, claritin, lipitor, effexor, cardizem, prilosec, flonase nasal spray, trelegy inhaler, linzess, hydroxyzine, if needed may take baclofen Diabetic medication ----- n/a Patient instructed no nail polish to be worn day of surgery Patient instructed to bring photo id and insurance card day of surgery Patient aware to have Driver (ride ) / caregiver    for 24 hours after surgery -- son, dominique Patient Special Instructions ----- asked to bring rescue inhaler dos.   Also, hold belbuca medication morning of surgery Pre-Op special Istructions ----- spoke w/ Dr Lacretia Nicks. Hyacinth Meeker MDA on 05-09-2022 inquired what instructions to give pt pertaining belbua pain medication,  stated have pt hold am dos Patient verbalized understanding of instructions that were given at this phone interview. Patient denies shortness of breath, chest pain, fever, cough at this phone interview.

## 2022-05-13 ENCOUNTER — Other Ambulatory Visit: Payer: Self-pay | Admitting: Student

## 2022-05-13 ENCOUNTER — Ambulatory Visit: Payer: 59 | Admitting: Student

## 2022-05-13 DIAGNOSIS — E1142 Type 2 diabetes mellitus with diabetic polyneuropathy: Secondary | ICD-10-CM

## 2022-05-14 ENCOUNTER — Ambulatory Visit: Payer: 59 | Admitting: Orthopaedic Surgery

## 2022-05-14 NOTE — Anesthesia Preprocedure Evaluation (Signed)
Anesthesia Evaluation  Patient identified by MRN, date of birth, ID band Patient awake    Reviewed: Allergy & Precautions, NPO status , Patient's Chart, lab work & pertinent test results  Airway Mallampati: I  TM Distance: >3 FB Neck ROM: Full    Dental no notable dental hx. (+) Edentulous Upper, Partial Lower, Poor Dentition, Dental Advisory Given,    Pulmonary shortness of breath, asthma , Current Smoker and Patient abstained from smoking. Obesity hypoventilation syndrome   Pulmonary exam normal breath sounds clear to auscultation       Cardiovascular hypertension, Pt. on medications +CHF  Normal cardiovascular exam+ dysrhythmias (LBBB) Supra Ventricular Tachycardia  Rhythm:Regular Rate:Normal     Neuro/Psych  PSYCHIATRIC DISORDERS   Bipolar Disorder    Neuromuscular disease    GI/Hepatic ,GERD  ,,(+)     substance abuse  cocaine useLab Results      Component                Value               Date                      CREATININE               1.10 (H)            05/15/2022                BUN                      18                  05/15/2022                NA                       143                 05/15/2022                K                        4.4                 05/15/2022                CL                       103                 05/15/2022                  Endo/Other  diabetes, Type 2    Renal/GU Renal InsufficiencyRenal disease     Musculoskeletal  (+) Arthritis ,    Abdominal  (+) + obese (BMI 41.2)  Peds  Hematology Lab Results      Component                Value               Date                      HGB                      18.4 (H)  05/15/2022                HCT                      54.0 (H)            05/15/2022                  Anesthesia Other Findings All: ASA Methadone and Levoquin  Reproductive/Obstetrics                             Anesthesia  Physical Anesthesia Plan  ASA: 3  Anesthesia Plan: General   Post-op Pain Management: Precedex and Tylenol PO (pre-op)*   Induction: Intravenous  PONV Risk Score and Plan: 3 and Treatment may vary due to age or medical condition, Midazolam and Ondansetron  Airway Management Planned: LMA  Additional Equipment: None  Intra-op Plan:   Post-operative Plan: Extubation in OR  Informed Consent: I have reviewed the patients History and Physical, chart, labs and discussed the procedure including the risks, benefits and alternatives for the proposed anesthesia with the patient or authorized representative who has indicated his/her understanding and acceptance.     Dental advisory given  Plan Discussed with:   Anesthesia Plan Comments:        Anesthesia Quick Evaluation

## 2022-05-15 ENCOUNTER — Ambulatory Visit (HOSPITAL_BASED_OUTPATIENT_CLINIC_OR_DEPARTMENT_OTHER): Payer: 59 | Admitting: Anesthesiology

## 2022-05-15 ENCOUNTER — Ambulatory Visit (HOSPITAL_BASED_OUTPATIENT_CLINIC_OR_DEPARTMENT_OTHER)
Admission: RE | Admit: 2022-05-15 | Discharge: 2022-05-15 | Disposition: A | Discharge status: Home / Self Care | Payer: 59 | Attending: Urology | Admitting: Urology

## 2022-05-15 ENCOUNTER — Other Ambulatory Visit: Payer: Self-pay

## 2022-05-15 ENCOUNTER — Encounter (HOSPITAL_BASED_OUTPATIENT_CLINIC_OR_DEPARTMENT_OTHER)
Admission: RE | Disposition: A | Discharge status: Home / Self Care | Payer: Self-pay | Source: Home / Self Care | Attending: Urology

## 2022-05-15 ENCOUNTER — Encounter (HOSPITAL_BASED_OUTPATIENT_CLINIC_OR_DEPARTMENT_OTHER): Payer: Self-pay | Admitting: Urology

## 2022-05-15 DIAGNOSIS — Z79899 Other long term (current) drug therapy: Secondary | ICD-10-CM | POA: Insufficient documentation

## 2022-05-15 DIAGNOSIS — E119 Type 2 diabetes mellitus without complications: Secondary | ICD-10-CM | POA: Diagnosis not present

## 2022-05-15 DIAGNOSIS — F17200 Nicotine dependence, unspecified, uncomplicated: Secondary | ICD-10-CM | POA: Insufficient documentation

## 2022-05-15 DIAGNOSIS — Z01818 Encounter for other preprocedural examination: Secondary | ICD-10-CM

## 2022-05-15 DIAGNOSIS — K219 Gastro-esophageal reflux disease without esophagitis: Secondary | ICD-10-CM | POA: Insufficient documentation

## 2022-05-15 DIAGNOSIS — N3941 Urge incontinence: Secondary | ICD-10-CM

## 2022-05-15 DIAGNOSIS — M1990 Unspecified osteoarthritis, unspecified site: Secondary | ICD-10-CM | POA: Diagnosis not present

## 2022-05-15 DIAGNOSIS — I471 Supraventricular tachycardia, unspecified: Secondary | ICD-10-CM | POA: Insufficient documentation

## 2022-05-15 DIAGNOSIS — I110 Hypertensive heart disease with heart failure: Secondary | ICD-10-CM

## 2022-05-15 DIAGNOSIS — F17210 Nicotine dependence, cigarettes, uncomplicated: Secondary | ICD-10-CM | POA: Diagnosis not present

## 2022-05-15 DIAGNOSIS — F319 Bipolar disorder, unspecified: Secondary | ICD-10-CM | POA: Insufficient documentation

## 2022-05-15 DIAGNOSIS — J45909 Unspecified asthma, uncomplicated: Secondary | ICD-10-CM | POA: Diagnosis not present

## 2022-05-15 DIAGNOSIS — N289 Disorder of kidney and ureter, unspecified: Secondary | ICD-10-CM | POA: Diagnosis not present

## 2022-05-15 DIAGNOSIS — I509 Heart failure, unspecified: Secondary | ICD-10-CM | POA: Insufficient documentation

## 2022-05-15 HISTORY — PX: BOTOX INJECTION: SHX5754

## 2022-05-15 HISTORY — PX: CYSTOSCOPY: SHX5120

## 2022-05-15 LAB — POCT I-STAT, CHEM 8
BUN: 18 mg/dL (ref 6–20)
Calcium, Ion: 1.16 mmol/L (ref 1.15–1.40)
Chloride: 103 mmol/L (ref 98–111)
Creatinine, Ser: 1.1 mg/dL — ABNORMAL HIGH (ref 0.44–1.00)
Glucose, Bld: 106 mg/dL — ABNORMAL HIGH (ref 70–99)
HCT: 54 % — ABNORMAL HIGH (ref 36.0–46.0)
Hemoglobin: 18.4 g/dL — ABNORMAL HIGH (ref 12.0–15.0)
Potassium: 4.4 mmol/L (ref 3.5–5.1)
Sodium: 143 mmol/L (ref 135–145)
TCO2: 30 mmol/L (ref 22–32)

## 2022-05-15 LAB — GLUCOSE, CAPILLARY: Glucose-Capillary: 113 mg/dL — ABNORMAL HIGH (ref 70–99)

## 2022-05-15 SURGERY — CYSTOSCOPY
Anesthesia: General | Site: Urethra

## 2022-05-15 MED ORDER — DEXAMETHASONE SODIUM PHOSPHATE 4 MG/ML IJ SOLN
INTRAMUSCULAR | Status: DC | PRN
  Administered 2022-05-15: 5 mg via INTRAVENOUS

## 2022-05-15 MED ORDER — DEXMEDETOMIDINE HCL IN NACL 80 MCG/20ML IV SOLN
INTRAVENOUS | Status: DC | PRN
  Administered 2022-05-15: 12 ug via BUCCAL

## 2022-05-15 MED ORDER — OXYCODONE HCL 5 MG PO TABS: 5.0000 mg | ORAL_TABLET | Freq: Once | ORAL | Status: DC | PRN

## 2022-05-15 MED ORDER — CEFAZOLIN SODIUM-DEXTROSE 2-4 GM/100ML-% IV SOLN
INTRAVENOUS | Status: AC
  Filled 2022-05-15: qty 100

## 2022-05-15 MED ORDER — ACETAMINOPHEN 10 MG/ML IV SOLN: 1000.0000 mg | Freq: Once | INTRAVENOUS | Status: DC | PRN

## 2022-05-15 MED ORDER — LIDOCAINE HCL (PF) 2 % IJ SOLN
INTRAMUSCULAR | Status: AC
  Filled 2022-05-15: qty 5

## 2022-05-15 MED ORDER — ONDANSETRON HCL 4 MG/2ML IJ SOLN
INTRAMUSCULAR | Status: AC
  Filled 2022-05-15: qty 2

## 2022-05-15 MED ORDER — SODIUM CHLORIDE 0.9 % IV SOLN: INTRAVENOUS | Status: DC

## 2022-05-15 MED ORDER — SODIUM CHLORIDE (PF) 0.9 % IJ SOLN
INTRAMUSCULAR | Status: DC | PRN
  Administered 2022-05-15: 20 mL

## 2022-05-15 MED ORDER — FENTANYL CITRATE (PF) 100 MCG/2ML IJ SOLN: 25.0000 ug | INTRAMUSCULAR | Status: DC | PRN

## 2022-05-15 MED ORDER — ONDANSETRON HCL 4 MG/2ML IJ SOLN
INTRAMUSCULAR | Status: DC | PRN
  Administered 2022-05-15: 4 mg via INTRAVENOUS

## 2022-05-15 MED ORDER — PROPOFOL 10 MG/ML IV BOLUS
INTRAVENOUS | Status: AC
  Filled 2022-05-15: qty 20

## 2022-05-15 MED ORDER — OXYCODONE HCL 5 MG/5ML PO SOLN: 5.0000 mg | Freq: Once | ORAL | Status: DC | PRN

## 2022-05-15 MED ORDER — CEFAZOLIN SODIUM-DEXTROSE 2-4 GM/100ML-% IV SOLN
2.0000 g | INTRAVENOUS | Status: AC
  Administered 2022-05-15: 2 g via INTRAVENOUS

## 2022-05-15 MED ORDER — LIDOCAINE HCL (CARDIAC) PF 100 MG/5ML IV SOSY
PREFILLED_SYRINGE | INTRAVENOUS | Status: DC | PRN
  Administered 2022-05-15: 60 mg via INTRAVENOUS

## 2022-05-15 MED ORDER — PROPOFOL 10 MG/ML IV BOLUS
INTRAVENOUS | Status: DC | PRN
  Administered 2022-05-15: 50 mg via INTRAVENOUS
  Administered 2022-05-15: 150 mg via INTRAVENOUS

## 2022-05-15 MED ORDER — STERILE WATER FOR IRRIGATION IR SOLN
Status: DC | PRN
  Administered 2022-05-15: 3000 mL

## 2022-05-15 MED ORDER — DEXMEDETOMIDINE HCL IN NACL 80 MCG/20ML IV SOLN
INTRAVENOUS | Status: AC
  Filled 2022-05-15: qty 20

## 2022-05-15 MED ORDER — FENTANYL CITRATE (PF) 100 MCG/2ML IJ SOLN
INTRAMUSCULAR | Status: DC | PRN
  Administered 2022-05-15 (×4): 25 ug via INTRAVENOUS

## 2022-05-15 MED ORDER — FENTANYL CITRATE (PF) 100 MCG/2ML IJ SOLN
INTRAMUSCULAR | Status: AC
  Filled 2022-05-15: qty 2

## 2022-05-15 MED ORDER — ONDANSETRON HCL 4 MG/2ML IJ SOLN: 4.0000 mg | Freq: Once | INTRAMUSCULAR | Status: DC | PRN

## 2022-05-15 MED ORDER — ONABOTULINUMTOXINA 100 UNITS IJ SOLR
INTRAMUSCULAR | Status: DC | PRN
  Administered 2022-05-15: 200 [IU] via INTRAMUSCULAR

## 2022-05-15 SURGICAL SUPPLY — 20 items
BAG DRAIN URO-CYSTO SKYTR STRL (DRAIN) ×2 IMPLANT
BAG DRN UROCATH (DRAIN) ×2
CATH ROBINSON RED A/P 14FR (CATHETERS) IMPLANT
CLOTH BEACON ORANGE TIMEOUT ST (SAFETY) ×2 IMPLANT
ELECT REM PT RETURN 9FT ADLT (ELECTROSURGICAL)
ELECTRODE REM PT RTRN 9FT ADLT (ELECTROSURGICAL) ×2 IMPLANT
GLOVE BIO SURGEON STRL SZ7.5 (GLOVE) ×2 IMPLANT
GLOVE BIOGEL PI IND STRL 6 (GLOVE) IMPLANT
GOWN STRL REUS W/TWL LRG LVL3 (GOWN DISPOSABLE) ×6 IMPLANT
KIT TURNOVER CYSTO (KITS) ×2 IMPLANT
MANIFOLD NEPTUNE II (INSTRUMENTS) ×2 IMPLANT
NDL ASPIRATION 22 (NEEDLE) IMPLANT
NDL SAFETY ECLIP 18X1.5 (MISCELLANEOUS) IMPLANT
NEEDLE ASPIRATION 22 (NEEDLE) ×2 IMPLANT
PACK CYSTO (CUSTOM PROCEDURE TRAY) ×2 IMPLANT
SLEEVE SCD COMPRESS KNEE MED (STOCKING) ×2 IMPLANT
SYR 20ML LL LF (SYRINGE) IMPLANT
SYR CONTROL 10ML LL (SYRINGE) IMPLANT
TUBE CONNECTING 12X1/4 (SUCTIONS) ×2 IMPLANT
WATER STERILE IRR 3000ML UROMA (IV SOLUTION) ×2 IMPLANT

## 2022-05-15 NOTE — Anesthesia Procedure Notes (Addendum)
Procedure Name: LMA Insertion Date/Time: 05/15/2022 9:38 AM  Performed by: Jessica Priest, CRNAPre-anesthesia Checklist: Patient identified, Emergency Drugs available, Suction available, Patient being monitored and Timeout performed Patient Re-evaluated:Patient Re-evaluated prior to induction Oxygen Delivery Method: Circle system utilized Preoxygenation: Pre-oxygenation with 100% oxygen Induction Type: IV induction Ventilation: Mask ventilation without difficulty LMA: LMA with gastric port inserted LMA Size: 4.0 Number of attempts: 1 Airway Equipment and Method: Bite block Placement Confirmation: positive ETCO2, breath sounds checked- equal and bilateral and CO2 detector Tube secured with: Tape Dental Injury: Teeth and Oropharynx as per pre-operative assessment

## 2022-05-15 NOTE — H&P (Signed)
Office Visit Report     05/06/2022   --------------------------------------------------------------------------------   [name]  MRN: [id number]  DOB: 1965-11-27, 57 year old Female  PRIMARY CARE:  Doralee Albino, MD  PRIMARY CARE FAX:  [phone]  REFERRING:  Cristal Deer A. Liliane Shi, MD  PROVIDER:  Rhoderick Moody, M.D.  LOCATION:  [facility] [phone]     --------------------------------------------------------------------------------   CC/HPI: Incontinence   Ms. Hertenstein is a 57 year old female with a history of cauda equina syndrome (s/p PLIF in 06/2021), a neurogenic bladder, urge incontinence and nocturnal enuresis. S/p Botox intravesical injections on 07/27/19.   UDS: Ms. Seith held a max capacity of approx. 300 mls. Her 1st sensation was felt at 176 mls. No SUI was noted. There was positive low amplitude instability. She did feel an increased urge but was able to inhibit leaking. She was able to generate a voluntary contraction and void. PVR was approx.100 mls. No reflux was seen.   01/15/19: The patient is here today for a routine follow-up. The patient is S/P cystoscopy with Botox injections (100 units) on 12/29/2018. Today, she reports that her nocturnal enuresis and urge incontinence has completely resolved. She does report that she will void a small volume and then have to lean forward and almost Crede in order to completely empty her bladder. She denies suprapubic discomfort, dysuria or gross hematuria. She does report that she has malodorous urine on occasion. She voided immediately prior to her office visit today and was unable to leave a urine specimen. PVR-0 mL   06/09/19: The patient is here today for a routine follow-up. She reports sustained improvement in her urge incontinence and nocturnal enuresis following intravesical botox injections last December. She notes that she can sense when her bladder is full and has plenty of lead-time to make it  to the bathroom in time. Denies interval UTIs, dysuria or hematuria. Doing well.   08/13/19: Jetty is here today for a routine post-op check-up following her recent botox injections. She reports minimal urinary urgency, frequency and denies incontinence. She states that she has a good FOS and feels like she is emptying well. Denies interval UTIs, dysuria or hematuria. UA today shows signs of cystitis.   02/08/21: The patient is here today for a routine follow-up. She had gastric sleeve surgery and multiple joint/spinal surgeries over the past 18 months. Her last botox injection was in 2021 and she reports worsening incontinence. Nocturia x 3-4 with episodes of enuresis. She reports sporadic episodes of dysuria, but hasn't been diagnosed with any interval UTIs. UA today is concerning for UTI. No flank pain or hematuria.   10/29/21: The patient is here today for a routine follow-up. She is doing well and has lost 140 lbs over the past year, s/p gastric bypass. From a urinary standpoint, she reports slightly more urinary urgency over the past 3-4 weeks. Denies interval UTIs, dysuria or hematuria.   05/06/22: The patient is here today for routine follow-up. She notes increased urinary urgency and episodes of incontinence over the past 2 to 3 weeks. She notes 1-2 UTIs since her last visit, but was unable to leave a urine specimen today.     ALLERGIES: Aspirin Levofloxacin Methadone HCl TABS    MEDICATIONS: Allopurinol  Hydrochlorothiazide  Omeprazole  Albuterol Sulfate Hfa  Amlodipine Besylate  Ammonium Lactate  Atorvastatin Calcium 40 mg tablet  Baclofen  Belbuca 900 mcg film, medicated  Buspirone Hcl 10 mg tablet  Calcium  Citalopram Hbr 20  mg tablet  Diclofenac Sodium 1 % gel  Diltiazem 12Hr Er  Docusate Sodium  Fluticasone Propionate  Fluticasone Propionate 50 mcg/actuation spray, suspension  Gabapentin 300 mg capsule  Hydroxyzine Hcl  Ketoconazole 2 % cream  Linzess  Loratadine 10 mg  tablet  Losartan Potassium 25 mg tablet  Methocarbamol 750 mg tablet  Metolazone  Montelukast Sodium 10 mg tablet  Multivitamin  Mupirocin 2 % ointment  Narcan  Nystatin  Potassium  Prednisolone  Tylenol  Venlafaxine Hcl     Notes: pt start new insulin will call back to advise the name   GU PSH: Catheterization For Collection Of Specimen, Single Patient, All Places Of Service - 2019 Catheterize For Residual - 2019 Complex cystometrogram, w/ void pressure and urethral pressure profile studies, any technique - 2020 Complex Uroflow - 2020 Cystourethroscopy, W/Injection For Chemodenervation Of Bladder - 11/28/2021, 05/02/2021, 2021, 2020 Emg surf Electrd - 2020 Inject For cystogram - 2020 Intrabd voidng Press - 2020       PSH Notes: Back Surgery, Neck Surgery   NON-GU PSH: Back Surgery (Unspecified) Breast Surgery Procedure Foot surgery (unspecified), Left Neck Surgery (Unspecified)     GU PMH: Acute Cystitis/UTI - 02/08/2021 Urge incontinence - 02/08/2021, (Stable), - 2020, Urge incontinence of urine, - 2014 Nocturia - 2020 Bladder, Neuromuscular dysfunction, Unspec, Neurogenic bladder - 2014 Urinary Frequency, Increased urinary frequency - 2014 Urinary Retention, Unspec, Incomplete bladder emptying - 2014      PMH Notes:  2005-12-31 11:12:16 - Note: Cancer  2005-12-31 11:12:16 - Note: Arthritis   NON-GU PMH: Cauda equina syndrome - 02/08/2021, - 2019 Glycosuria - 2020 Diabetes Type 2 - 2019 Obesity - 2019 Asthma, Asthma - 2014 Hyperthyroidism, Hyperthyroidism - 2014 Personal history of diseases of the blood and blood-forming organs and certain disorders involving the immune mechanism, History of sickle cell anemia - 2014 Personal history of other diseases of the circulatory system, History of hypertension - 2014 Personal history of other endocrine, nutritional and metabolic disease, History of hypercholesterolemia - 2014 Personal history of other mental and behavioral  disorders, History of depression - 2014 Personal history of other specified conditions, History of heartburn - 2014 Encounter for general adult medical examination without abnormal findings, Encounter for preventive health examination    FAMILY HISTORY: 1 son - Son   SOCIAL HISTORY: Marital Status: Single Preferred Language: English Current Smoking Status: Patient smokes occasionally.   Tobacco Use Assessment Completed: Used Tobacco in last 30 days? Has never drank.  Does not drink caffeine. Patient's occupation is/was Disabled.     Notes: Marital History - Divorced, Alcohol Use, Occupation:, Tobacco Use, Caffeine Use, Death In The Family Father   REVIEW OF SYSTEMS:    GU Review Female:   Patient denies frequent urination, hard to postpone urination, burning /pain with urination, get up at night to urinate, leakage of urine, stream starts and stops, trouble starting your stream, have to strain to urinate, and being pregnant.  Gastrointestinal (Upper):   Patient denies nausea, vomiting, and indigestion/ heartburn.  Gastrointestinal (Lower):   Patient denies diarrhea and constipation.  Constitutional:   Patient denies fatigue, night sweats, weight loss, and fever.  Skin:   Patient denies skin rash/ lesion and itching.  Eyes:   Patient denies blurred vision and double vision.  Ears/ Nose/ Throat:   Patient denies sore throat and sinus problems.  Hematologic/Lymphatic:   Patient denies swollen glands and easy bruising.  Cardiovascular:   Patient denies leg swelling and chest pains.  Respiratory:  Patient denies cough and shortness of breath.  Endocrine:   Patient denies excessive thirst.  Musculoskeletal:   Patient denies back pain and joint pain.  Neurological:   Patient denies headaches and dizziness.  Psychologic:   Patient denies depression and anxiety.   VITAL SIGNS:      05/06/2022 08:16 AM  Weight 279 lb / 126.55 kg  Height 69 in / 175.26 cm  BP 137/8 mmHg  Pulse 116 /min   BMI 41.2 kg/m   MULTI-SYSTEM PHYSICAL EXAMINATION:    Constitutional: Well-nourished. No physical deformities. Normally developed. Good grooming.  Neurologic / Psychiatric: Oriented to time, oriented to place, oriented to person. No depression, no anxiety, no agitation.  Gastrointestinal: Obese abdomen. No mass, no tenderness, no rigidity.      Complexity of Data:  Records Review:   Previous Hospital Records, Previous Patient Records   PROCEDURES:          Visit Complexity - G2211    ASSESSMENT:      ICD-10 Details  1 GU:   Urge incontinence - N39.41 Chronic, Stable   PLAN:           Orders Labs Urine Culture, Urinalysis          Schedule Return Visit/Planned Activity: Next Available Appointment - Schedule Surgery          Document Letter(s):  Created for Patient: Clinical Summary         Notes:   -Plan for cysto w/ botox injections in the coming weeks (200 units). Risks discussed. We specifically discussed the risk of urinary retention and the need for an indwelling Foley or CIC. She voices understanding and wishes to proceed.

## 2022-05-15 NOTE — Anesthesia Postprocedure Evaluation (Signed)
Anesthesia Post Note  Patient: [name]  Procedure(s) Performed: CYSTOSCOPY (Urethra) BOTOX INJECTION 200 UNITS (Bladder)     Patient location during evaluation: PACU Anesthesia Type: General Level of consciousness: awake and alert Pain management: pain level controlled Vital Signs Assessment: post-procedure vital signs reviewed and stable Respiratory status: spontaneous breathing, nonlabored ventilation, respiratory function stable and patient connected to nasal cannula oxygen Cardiovascular status: blood pressure returned to baseline and stable Postop Assessment: no apparent nausea or vomiting Anesthetic complications: no  No notable events documented.  Last Vitals:  Vitals:   05/15/22 1010 05/15/22 1035  BP: 139/87 (!) 148/82  Pulse: 82 84  Resp: 18 16  Temp: 36.7 C   SpO2: 96% 95%    Last Pain:  Vitals:   05/15/22 1035  TempSrc:   PainSc: 0-No pain                 Trevor Iha

## 2022-05-15 NOTE — Op Note (Signed)
Operative Note  Preoperative diagnosis:  1.  Refractory urge incontinence  Postoperative diagnosis: 1.  Refractory urge incontinence  Procedure(s): 1.  Cystoscopy with intravesical Botox injection (200 units)  Surgeon: Rhoderick Moody, MD  Assistants:  None  Anesthesia:  General  Complications:  None  EBL: Less than 5 mL  Specimens: 1.  None  Drains/Catheters: 1.  None  Intraoperative findings:   No intravesical or urethral abnormalities  Indication:  [name] is a 57 y.o. female with refractory urge incontinence that is admittable with intravesical Botox injections.  She is here today for repeat cystoscopy and Botox injections.  He has been consented for the above procedures, voices understanding and wishes to proceed.  Description of procedure:  After informed consent was obtained, the patient was brought to the operating room and general LMA anesthesia was administered. The patient was then placed in the dorsolithotomy position and prepped and draped in the usual sterile fashion. A timeout was performed. A 23 French rigid cystoscope was then inserted into the urethral meatus and advanced into the bladder under direct vision. A complete bladder survey revealed no intravesical pathology.   I then injected a total of 200 units of Botox diluted in 10 mL of saline in 0.5 mL aliquots throughout the detrusor musculature, and a grid like fashion.  There was no evidence of bleeding following the injections.  The patient's bladder was then partially drained.  She tolerated the procedure well and was transferred to the postanesthesia in stable condition.  Plan: Follow-up in 1 week for checkup and PVR

## 2022-05-15 NOTE — Transfer of Care (Signed)
Immediate Anesthesia Transfer of Care Note  Patient: [name]  Procedure(s) Performed: Procedure(s) (LRB): CYSTOSCOPY (N/A) BOTOX INJECTION 200 UNITS (N/A)  Patient Location: PACU  Anesthesia Type: General  Level of Consciousness: awake, sedated, patient cooperative and responds to stimulation  Airway & Oxygen Therapy: Patient Spontanous Breathing and Patient connected to [place] oxygen  Post-op Assessment: Report given to PACU RN, Post -op Vital signs reviewed and stable and Patient moving all extremities  Post vital signs: Reviewed and stable  Complications: No apparent anesthesia complications

## 2022-05-15 NOTE — Progress Notes (Addendum)
Unable to void after third trip to bathroom. Inserted foley cath without difficulty per order after timeout with Catie Architect. Returned 275 ml amber colored urine.

## 2022-05-16 ENCOUNTER — Encounter (HOSPITAL_BASED_OUTPATIENT_CLINIC_OR_DEPARTMENT_OTHER): Payer: Self-pay | Admitting: Urology

## 2022-05-19 NOTE — Progress Notes (Deleted)
Office Visit Note   Patient: [name]           Date of Birth: December 27, 1965           MRN: [id number] Visit Date: 05/21/2022              Requested by: Jerre Simon, MD [id number] [place],  [place] [address] PCP: Jerre Simon, MD   Assessment & Plan: Visit Diagnoses: No diagnosis found.  Plan: ***  Follow-Up Instructions: No follow-ups on file.   Orders:  No orders of the defined types were placed in this encounter.  No orders of the defined types were placed in this encounter.     Procedures: No procedures performed   Clinical Data: No additional findings.   Subjective: No chief complaint on file.   HPI  Review of Systems  Constitutional: Negative.   HENT: Negative.    Eyes: Negative.   Respiratory: Negative.    Cardiovascular: Negative.   Endocrine: Negative.   Musculoskeletal: Negative.   Neurological: Negative.   Hematological: Negative.   Psychiatric/Behavioral: Negative.    All other systems reviewed and are negative.   Objective: Vital Signs: LMP 01/29/1992   Physical Exam Vitals and nursing note reviewed.  Constitutional:      Appearance: She is well-developed.  HENT:     Head: Normocephalic and atraumatic.  Pulmonary:     Effort: Pulmonary effort is normal.  Abdominal:     Palpations: Abdomen is soft.  Musculoskeletal:     Cervical back: Neck supple.  Skin:    General: Skin is warm.     Capillary Refill: Capillary refill takes less than 2 seconds.  Neurological:     Mental Status: She is alert and oriented to person, place, and time.  Psychiatric:        Behavior: Behavior normal.        Thought Content: Thought content normal.        Judgment: Judgment normal.   Ortho Exam  Specialty Comments:  No specialty comments available.  Imaging: No results found.   PMFS History: Patient Active Problem List   Diagnosis Date Noted  . Panniculitis 12/04/2021  . Infection of lip 08/27/2021  . Lumbar stenosis with  neurogenic claudication 06/29/2021  . Lumbar radiculopathy 06/29/2021  . Biceps tendinopathy of right upper extremity 05/03/2021  . Irregular heartbeat 04/11/2021  . Neuropathic pain of foot 03/28/2021  . Chronic congestion of paranasal sinus 03/28/2021  . Impingement syndrome of right shoulder 03/08/2021  . Arthritis of right acromioclavicular joint 03/08/2021  . Acute medial meniscus tear of right knee 03/08/2021  . Acute lateral meniscus tear of right knee 03/08/2021  . Spinal stenosis of thoracic region with radiculopathy   . Bilateral leg numbness   . Spinal stenosis of lumbar region with neurogenic claudication   . Primary osteoarthritis of right knee   . Impaired ambulation 01/10/2021  . Status post lumbar laminectomy 10/03/2020  . Cubital tunnel syndrome on left 05/02/2020  . Vaginal discharge 01/20/2020  . Prolapse of anterior vaginal wall 11/10/2019  . Skin ulcer of left thigh, limited to breakdown of skin 09/09/2019  . Ulnar nerve compression 08/06/2019  . Diabetic neuropathy associated with type 2 diabetes mellitus 08/06/2019  . Body mass index 50.0-59.9, adult 06/12/2019  . Impingement syndrome of left shoulder 05/07/2019  . Polyp of colon   . Polyp of ascending colon   . Rectal discomfort 02/12/2019  . Osteoarthritis of right hip 02/12/2019  . Nontraumatic  tear of left supraspinatus tendon 12/30/2018  . Gout 12/17/2018  . Nontraumatic complete tear of right rotator cuff 12/08/2018  . Rotator cuff tear, right 12/01/2018  . Tobacco abuse 08/21/2018  . Right groin pain 07/20/2018  . Depressed mood 07/13/2018  . Paroxysmal SVT (supraventricular tachycardia) 03/20/2018  . Pure hypercholesterolemia 03/20/2018  . Muscle cramping 02/25/2018  . Nodule of upper lobe of right lung 02/13/2018  . Falls, subsequent encounter 01/30/2018  . Pain in left foot 11/04/2017  . Essential hypertension 10/27/2017  . Solitary pulmonary nodule 10/07/2017  . Restrictive lung disease  secondary to obesity 10/01/2017  . Polycythemia, secondary 10/01/2017  . Chronic viral hepatitis B without delta-agent 07/08/2017  . COPD (chronic obstructive pulmonary disease) with chronic bronchitis 06/27/2017  . Chronic kidney disease (CKD), stage IV (severe) 01/14/2017  . Decubitus ulcer 12/13/2016  . Type 2 diabetes mellitus with stage 3 chronic kidney disease, with long-term current use of insulin 12/12/2016  . Urge incontinence of urine 12/05/2016  . Trigger finger, left index finger 07/15/2016  . Back pain 04/07/2014  . Morbid obesity 10/02/2012  . Chronic diastolic CHF (congestive heart failure) (HCC) 04/07/2012  . Skin lesion of scalp 03/15/2011  . GERD 01/26/2010  . Obstructive sleep apnea treated with BiPAP 02/03/2008  . FIBROCYSTIC BREAST DISEASE 10/28/2006  . Hyperlipidemia 03/27/2006  . Obesity hypoventilation syndrome 03/27/2006  . Depression with anxiety 03/27/2006   Past Medical History:  Diagnosis Date  . Alcoholism in remission    11-26-2021  pt stated no alcohol few yrs ago  . Asthma   . Bipolar disorder   . Chronic diastolic CHF (congestive heart failure) 2014   followed by dr b. Cristal Deer;  . Chronic hypoxemic respiratory failure    pulmonologist--- dr Tonia Brooms;   (11-26-2021 per pt was on supplemental oxygen until she 01/ 2022 did not need it,  does not check O2 sats at home anymore.  . CKD (chronic kidney disease), stage III    nephrologist--- dr Ronalee Belts  . COPD (chronic obstructive pulmonary disease)    11-26-2021 pt trying to quit smoking ,  started age 69  . Depression   . Diabetes mellitus type 2, diet-controlled    followed by pcp   (11-26-2021  per pt last A1c by pcp on 10-30-2021 7.8,  stated does not check blood sugar)  . Dyspnea   . Fibrocystic breast disease   . GAD (generalized anxiety disorder)   . GERD (gastroesophageal reflux disease)   . Hepatitis B surface antigen positive 11/2017   pt stated asymptomatic ,  no treatment  . History  of cervical dysplasia 04/10/2011   '93- once dx.-got pregnant-no intervention, then postpartum, no dysplasia found  . History of condyloma acuminatum 04/09/2011  . Hyperlipidemia   . Hypertension   . Incomplete left bundle branch block (LBBB)   . Lung nodule 11/2017   pulmonologist--- dr icard;  RUL and RLL  . Muscle spasm of both lower legs    chronic , due to back issues,  . OA (osteoarthritis) 04/10/2011   hips, shoulders, back  . OSA (obstructive sleep apnea) 2009   study on epic 09-18-2007 mild osa w/ hypoxmia ;   (11-26-2021 per pt last used cpap 01/ 2022 before bariatric surgery 04/ 2022 stated lost wt / and stopped needing supplemental oxygen , did not need anymore)  . Polyneuropathy    due to back issues and DM  . Polysubstance abuse    11-26-2021  pt hx cocaine use stated none  for yrs but ED visit 08-14-2021  positive UDS,  pt stated it was once only for depression none since  . PSVT (paroxysmal supraventricular tachycardia)    followed by cardiology  . Spinal stenosis, unspecified spinal region   . Urge incontinence of urine   . Wears dentures    upper    Family History  Problem Relation Age of Onset  . Pulmonary embolism Mother   . Stroke Father   . Alcohol abuse Father   . Pneumonia Father   . Hypertension Father   . Breast cancer Maternal Aunt   . Hypertension Other   . Diabetes Other        Aunts and cousins  . Asthma Sister   . Depression Sister   . Arthritis Sister   . Sickle cell anemia Son     Past Surgical History:  Procedure Laterality Date  . ANAL FISTULECTOMY  04/17/2011   Procedure: FISTULECTOMY ANAL;  Surgeon: Wilmon Arms. Corliss Skains, MD;  Location: [facility];  Service: General;  Laterality: N/A;  Excision of Condyloma Gluteal Cleft   . ANAL RECTAL MANOMETRY N/A 03/26/2019   Procedure: ANO RECTAL MANOMETRY;  Surgeon: Tressia Danas, MD;  Location: [facility];  Service: Gastroenterology;  Laterality: N/A;  . BIOPSY  03/09/2019   Procedure: BIOPSY;   Surgeon: Tressia Danas, MD;  Location: [facility];  Service: Gastroenterology;;  . Haynes Hoehn INJECTION N/A 12/29/2018   Procedure: BOTOX INJECTION(100 UNITS)  WITH CYSTOSCOPY;  Surgeon: Rene Paci, MD;  Location: [facility];  Service: Urology;  Laterality: N/A;  . BOTOX INJECTION N/A 07/27/2019   Procedure: BOTOX INJECTION WITH CYSTOSCOPY;  Surgeon: Rene Paci, MD;  Location: [facility];  Service: Urology;  Laterality: N/A;  ONLY NEEDS 30 MIN  . BOTOX INJECTION N/A 05/02/2021   Procedure: BOTOX INJECTION WITH CYSTOSCOPY;  Surgeon: Rene Paci, MD;  Location: [facility];  Service: Urology;  Laterality: N/A;  ONLY NEEDS 30 MIN  . BOTOX INJECTION N/A 11/28/2021   Procedure: BOTOX INJECTION WITH CYSTOSCOPY, 200 UNITS;  Surgeon: Rene Paci, MD;  Location: [facility];  Service: Urology;  Laterality: N/A;  ONLY NEEDS 30 MIN  . BOTOX INJECTION N/A 05/15/2022   Procedure: BOTOX INJECTION 200 UNITS;  Surgeon: Rene Paci, MD;  Location: [facility];  Service: Urology;  Laterality: N/A;  . BREAST EXCISIONAL BIOPSY Right    x 3  . BREAST EXCISIONAL BIOPSY Left    12-29-2000  and 11-30-2001  by dr Carolynne Edouard  . CARPAL TUNNEL RELEASE Bilateral    right 08-30-2002 and left 07-04-2003  by dr Otelia Sergeant @ [facility]  . CATARACT EXTRACTION W/ INTRAOCULAR LENS IMPLANT Bilateral 12/2020  . CERVICAL FUSION  04/10/2011   x3- cervical fusion with plating and screws-Dr. Noel Gerold  . COLONOSCOPY WITH PROPOFOL N/A 07/06/2015   Procedure: COLONOSCOPY WITH PROPOFOL;  Surgeon: Dorena Cookey, MD;  Location: [facility];  Service: Endoscopy;  Laterality: N/A;  . COLONOSCOPY WITH PROPOFOL N/A 03/09/2019   Procedure: COLONOSCOPY WITH PROPOFOL;  Surgeon: Tressia Danas, MD;  Location: [facility];  Service: Gastroenterology;  Laterality: N/A;  . CYSTOSCOPY N/A 05/15/2022   Procedure: CYSTOSCOPY;  Surgeon: Rene Paci,  MD;  Location: [facility];  Service: Urology;  Laterality: N/A;  . FRACTURE SURGERY     little toe left foot  . I & D EXTREMITY Left 12/18/2016   Procedure: IRRIGATION AND DEBRIDEMENT LEFT FOOT, CLOSURE;  Surgeon: Tarry Kos, MD;  Location:  [facility];  Service: Orthopedics;  Laterality: Left;  . INCISION AND DRAINAGE ABSCESS Right 05/03/2002    by dr Carolynne Edouard;   right breast abscess  . INJECTION KNEE Right 10/03/2020   Procedure: KNEE INJECTION;  Surgeon: Kerrin Champagne, MD;  [facility];  Service: Orthopedics;  Laterality: Right;  . KNEE ARTHROSCOPY WITH MEDIAL MENISECTOMY Right 03/21/2021   Procedure: RIGHT KNEE ARTHROSCOPY WITH MEDIAL AND PARTIAL LATERAL MENISCECTOMY;  Surgeon: Tarry Kos, MD;  Location: [facility];  Service: Orthopedics;  Laterality: Right;  . LAPAROSCOPIC GASTRIC SLEEVE RESECTION N/A 05/08/2020   Procedure: LAPAROSCOPIC GASTRIC SLEEVE RESECTION;  Surgeon: Sheliah Hatch De Blanch, MD;  Location: [facility];  Service: General;  Laterality: N/A;  . LUMBAR DISC SURGERY  04/10/2011   x5-Lumbar fusion-retained hardware.(Dr. Otelia Sergeant)  . LUMBAR LAMINECTOMY N/A 10/03/2020   Procedure: THORACOLUMBAR LAMINECTOMIES THORACIC ELEVEN-TWELVE, THORACIC TWELVE-LUMBAR ONE  AND LUMBAT ONE-TWO;  Surgeon: Kerrin Champagne, MD;  Location: [facility];  Service: Orthopedics;  Laterality: N/A;  . LUMBAR PERCUTANEOUS PEDICLE SCREW 2 LEVEL  06/29/2021   Procedure: LUMBAR PERCUTANEOUS PEDICLE SCREW LUMBAR ONE-TWO;  Surgeon: Julio Sicks, MD;  Location: [facility];  Service: Neurosurgery;;  . MULTIPLE TOOTH EXTRACTIONS    . POLYPECTOMY  03/09/2019   Procedure: POLYPECTOMY;  Surgeon: Tressia Danas, MD;  Location: [facility];  Service: Gastroenterology;;  . POSTERIOR LUMBAR FUSION  04/04/2005    by dr Otelia Sergeant;   L3-- L5  . POSTERIOR LUMBAR FUSION  03/04/2006    dr Otelia Sergeant;   fusion L2--3 and redo L3--5  . SHOULDER ARTHROSCOPY WITH ROTATOR CUFF REPAIR AND SUBACROMIAL  DECOMPRESSION Left 05/07/2019   Procedure: LEFT SHOULDER ARTHROSCOPY WITH DEBRIDEMENT, DISTAL CLAVICLE EXCISION,  SUBACROMIAL DECOMPRESSION AND POSSIBLE ROTATOR CUFF REPAIR;  Surgeon: Tarry Kos, MD;  Location: [facility];  Service: Orthopedics;  Laterality: Left;  . TEE WITHOUT CARDIOVERSION N/A 04/06/2012   Procedure: TRANSESOPHAGEAL ECHOCARDIOGRAM (TEE);  Surgeon: Chrystie Nose, MD;  Location: [facility];  Service: Cardiovascular;  Laterality: N/A;  . TRIGGER FINGER RELEASE Right    middle finger  . TRIGGER FINGER RELEASE Left 06/28/2016   Procedure: RELEASE TRIGGER FINGER LEFT 3RD FINGER;  Surgeon: Tarry Kos, MD;  Location: [facility];  Service: Orthopedics;  Laterality: Left;  . TRIGGER FINGER RELEASE Right 06/12/2016   Procedure: RIGHT INDEX FINGER TRIGGER RELEASE;  Surgeon: Tarry Kos, MD;  Location: [facility];  Service: Orthopedics;  Laterality: Right;  . TRIGGER FINGER RELEASE Right 01/19/2018   Procedure: RIGHT RING FINGER TRIGGER FINGER RELEASE;  Surgeon: Tarry Kos, MD;  Location: [facility];  Service: Orthopedics;  Laterality: Right;  . TRIGGER FINGER RELEASE Left 05/07/2019   Procedure: RELEASE TRIGGER FINGER LEFT INDEX FINGER;  Surgeon: Tarry Kos, MD;  Location: [facility];  Service: Orthopedics;  Laterality: Left;  . UPPER GI ENDOSCOPY N/A 05/08/2020   Procedure: UPPER GI ENDOSCOPY;  Surgeon: Sheliah Hatch, De Blanch, MD;  Location: [facility];  Service: General;  Laterality: N/A;   Social History   Occupational History  . Occupation: disabled    Comment: Disabled  Tobacco Use  . Smoking status: Every Day    Packs/day: 0.50    Years: 44.00    Additional pack years: 0.00    Total pack years: 22.00    Types: Cigarettes    Start date: 01/28/1977  . Smokeless tobacco: Never  Vaping Use  . Vaping Use: Never used  Substance and Sexual Activity  . Alcohol use: Not Currently  Comment: hx alcoholism,  (11-26-2021 per pt no alcohol in a while)  . Drug use: Not Currently    Comment:  11-26-2021  (hx cocaine use) positive UDS in epic 08-14-2021  pt stated only did it once due to depression 07/ 2023 none since;  per pt last marijuana yrs ago  . Sexual activity: Not Currently    Partners: Male    Birth control/protection: Post-menopausal

## 2022-05-21 ENCOUNTER — Ambulatory Visit: Payer: 59 | Admitting: Orthopaedic Surgery

## 2022-05-28 ENCOUNTER — Ambulatory Visit: Payer: 59 | Admitting: Orthopaedic Surgery

## 2022-06-04 ENCOUNTER — Ambulatory Visit: Payer: 59 | Admitting: Orthopaedic Surgery

## 2022-06-09 ENCOUNTER — Emergency Department (HOSPITAL_COMMUNITY): Payer: 59

## 2022-06-09 ENCOUNTER — Inpatient Hospital Stay (HOSPITAL_COMMUNITY)
Admission: EM | Admit: 2022-06-09 | Discharge: 2022-06-14 | DRG: 291 | Disposition: A | Discharge status: Skilled Nursing Facility | Payer: 59 | Attending: Family Medicine | Admitting: Family Medicine

## 2022-06-09 ENCOUNTER — Other Ambulatory Visit: Payer: Self-pay

## 2022-06-09 ENCOUNTER — Encounter (HOSPITAL_COMMUNITY): Payer: Self-pay

## 2022-06-09 DIAGNOSIS — D751 Secondary polycythemia: Secondary | ICD-10-CM | POA: Diagnosis present

## 2022-06-09 DIAGNOSIS — E1122 Type 2 diabetes mellitus with diabetic chronic kidney disease: Secondary | ICD-10-CM | POA: Diagnosis present

## 2022-06-09 DIAGNOSIS — F32A Depression, unspecified: Secondary | ICD-10-CM | POA: Diagnosis present

## 2022-06-09 DIAGNOSIS — I130 Hypertensive heart and chronic kidney disease with heart failure and stage 1 through stage 4 chronic kidney disease, or unspecified chronic kidney disease: Principal | ICD-10-CM | POA: Diagnosis present

## 2022-06-09 DIAGNOSIS — I509 Heart failure, unspecified: Secondary | ICD-10-CM

## 2022-06-09 DIAGNOSIS — F17210 Nicotine dependence, cigarettes, uncomplicated: Secondary | ICD-10-CM | POA: Diagnosis present

## 2022-06-09 DIAGNOSIS — N179 Acute kidney failure, unspecified: Secondary | ICD-10-CM | POA: Diagnosis present

## 2022-06-09 DIAGNOSIS — E8770 Fluid overload, unspecified: Secondary | ICD-10-CM

## 2022-06-09 DIAGNOSIS — G8929 Other chronic pain: Secondary | ICD-10-CM | POA: Diagnosis present

## 2022-06-09 DIAGNOSIS — R0902 Hypoxemia: Secondary | ICD-10-CM | POA: Diagnosis not present

## 2022-06-09 DIAGNOSIS — F419 Anxiety disorder, unspecified: Secondary | ICD-10-CM | POA: Diagnosis present

## 2022-06-09 DIAGNOSIS — M1711 Unilateral primary osteoarthritis, right knee: Secondary | ICD-10-CM | POA: Diagnosis present

## 2022-06-09 DIAGNOSIS — Z6841 Body Mass Index (BMI) 40.0 and over, adult: Secondary | ICD-10-CM

## 2022-06-09 DIAGNOSIS — J449 Chronic obstructive pulmonary disease, unspecified: Secondary | ICD-10-CM | POA: Diagnosis present

## 2022-06-09 DIAGNOSIS — N183 Chronic kidney disease, stage 3 unspecified: Secondary | ICD-10-CM

## 2022-06-09 DIAGNOSIS — J309 Allergic rhinitis, unspecified: Secondary | ICD-10-CM | POA: Diagnosis present

## 2022-06-09 DIAGNOSIS — G4733 Obstructive sleep apnea (adult) (pediatric): Secondary | ICD-10-CM | POA: Diagnosis present

## 2022-06-09 DIAGNOSIS — K219 Gastro-esophageal reflux disease without esophagitis: Secondary | ICD-10-CM | POA: Diagnosis present

## 2022-06-09 DIAGNOSIS — M549 Dorsalgia, unspecified: Secondary | ICD-10-CM | POA: Diagnosis present

## 2022-06-09 DIAGNOSIS — Z794 Long term (current) use of insulin: Secondary | ICD-10-CM

## 2022-06-09 DIAGNOSIS — E785 Hyperlipidemia, unspecified: Secondary | ICD-10-CM | POA: Diagnosis present

## 2022-06-09 DIAGNOSIS — K5900 Constipation, unspecified: Secondary | ICD-10-CM | POA: Diagnosis present

## 2022-06-09 DIAGNOSIS — R296 Repeated falls: Secondary | ICD-10-CM | POA: Diagnosis present

## 2022-06-09 DIAGNOSIS — I10 Essential (primary) hypertension: Secondary | ICD-10-CM | POA: Diagnosis present

## 2022-06-09 DIAGNOSIS — I5033 Acute on chronic diastolic (congestive) heart failure: Secondary | ICD-10-CM | POA: Diagnosis present

## 2022-06-09 DIAGNOSIS — Z79899 Other long term (current) drug therapy: Secondary | ICD-10-CM

## 2022-06-09 DIAGNOSIS — J9601 Acute respiratory failure with hypoxia: Secondary | ICD-10-CM

## 2022-06-09 LAB — COMPREHENSIVE METABOLIC PANEL
ALT: 21 U/L (ref 0–44)
AST: 24 U/L (ref 15–41)
Albumin: 3.5 g/dL (ref 3.5–5.0)
Alkaline Phosphatase: 89 U/L (ref 38–126)
Anion gap: 12 (ref 5–15)
BUN: 23 mg/dL — ABNORMAL HIGH (ref 6–20)
CO2: 29 mmol/L (ref 22–32)
Calcium: 9.2 mg/dL (ref 8.9–10.3)
Chloride: 99 mmol/L (ref 98–111)
Creatinine, Ser: 1.22 mg/dL — ABNORMAL HIGH (ref 0.44–1.00)
GFR, Estimated: 51 mL/min — ABNORMAL LOW (ref >60)
Glucose, Bld: 87 mg/dL (ref 70–99)
Potassium: 4.1 mmol/L (ref 3.5–5.1)
Sodium: 140 mmol/L (ref 135–145)
Total Bilirubin: 0.9 mg/dL (ref 0.3–1.2)
Total Protein: 6.2 g/dL — ABNORMAL LOW (ref 6.5–8.1)

## 2022-06-09 LAB — CBC WITH DIFFERENTIAL/PLATELET
Abs Immature Granulocytes: 0.04 10*3/uL (ref 0.00–0.07)
Basophils Absolute: 0.1 10*3/uL (ref 0.0–0.1)
Basophils Relative: 1 %
Eosinophils Absolute: 0.1 10*3/uL (ref 0.0–0.5)
Eosinophils Relative: 1 %
HCT: 52.2 % — ABNORMAL HIGH (ref 36.0–46.0)
Hemoglobin: 17.5 g/dL — ABNORMAL HIGH (ref 12.0–15.0)
Immature Granulocytes: 0 %
Lymphocytes Relative: 14 %
Lymphs Abs: 1.7 10*3/uL (ref 0.7–4.0)
MCH: 25.5 pg — ABNORMAL LOW (ref 26.0–34.0)
MCHC: 33.5 g/dL (ref 30.0–36.0)
MCV: 76 fL — ABNORMAL LOW (ref 80.0–100.0)
Monocytes Absolute: 1 10*3/uL (ref 0.1–1.0)
Monocytes Relative: 8 %
Neutro Abs: 9.4 10*3/uL — ABNORMAL HIGH (ref 1.7–7.7)
Neutrophils Relative %: 76 %
Platelets: 364 10*3/uL (ref 150–400)
RBC: 6.87 MIL/uL — ABNORMAL HIGH (ref 3.87–5.11)
RDW: 18.9 % — ABNORMAL HIGH (ref 11.5–15.5)
WBC: 12.4 10*3/uL — ABNORMAL HIGH (ref 4.0–10.5)
nRBC: 0.2 % (ref 0.0–0.2)

## 2022-06-09 LAB — CBC
HCT: 52.3 % — ABNORMAL HIGH (ref 36.0–46.0)
Hemoglobin: 17.2 g/dL — ABNORMAL HIGH (ref 12.0–15.0)
MCH: 24.8 pg — ABNORMAL LOW (ref 26.0–34.0)
MCHC: 32.9 g/dL (ref 30.0–36.0)
MCV: 75.4 fL — ABNORMAL LOW (ref 80.0–100.0)
Platelets: 387 10*3/uL (ref 150–400)
RBC: 6.94 MIL/uL — ABNORMAL HIGH (ref 3.87–5.11)
RDW: 18.8 % — ABNORMAL HIGH (ref 11.5–15.5)
WBC: 12.3 10*3/uL — ABNORMAL HIGH (ref 4.0–10.5)
nRBC: 0.2 % (ref 0.0–0.2)

## 2022-06-09 LAB — CBG MONITORING, ED: Glucose-Capillary: 103 mg/dL — ABNORMAL HIGH (ref 70–99)

## 2022-06-09 LAB — TROPONIN I (HIGH SENSITIVITY): Troponin I (High Sensitivity): 4 ng/L (ref <18)

## 2022-06-09 LAB — CREATININE, SERUM
Creatinine, Ser: 1.12 mg/dL — ABNORMAL HIGH (ref 0.44–1.00)
GFR, Estimated: 57 mL/min — ABNORMAL LOW (ref >60)

## 2022-06-09 LAB — BRAIN NATRIURETIC PEPTIDE: B Natriuretic Peptide: 1336.9 pg/mL — ABNORMAL HIGH (ref 0.0–100.0)

## 2022-06-09 MED ORDER — FUROSEMIDE 10 MG/ML IJ SOLN
40.0000 mg | Freq: Once | INTRAMUSCULAR | Status: AC
  Administered 2022-06-09: 40 mg via INTRAVENOUS
  Filled 2022-06-09: qty 4

## 2022-06-09 MED ORDER — ACETAMINOPHEN 650 MG RE SUPP: 650.0000 mg | Freq: Four times a day (QID) | RECTAL | Status: DC | PRN

## 2022-06-09 MED ORDER — MONTELUKAST SODIUM 10 MG PO TABS
10.0000 mg | ORAL_TABLET | Freq: Every day | ORAL | Status: DC
  Administered 2022-06-10 – 2022-06-14 (×5): 10 mg via ORAL
  Filled 2022-06-09 (×5): qty 1

## 2022-06-09 MED ORDER — OXYCODONE HCL 5 MG PO TABS
10.0000 mg | ORAL_TABLET | Freq: Once | ORAL | Status: AC
  Administered 2022-06-09: 10 mg via ORAL
  Filled 2022-06-09: qty 2

## 2022-06-09 MED ORDER — FLUTICASONE FUROATE-VILANTEROL 100-25 MCG/ACT IN AEPB
1.0000 | INHALATION_SPRAY | Freq: Every day | RESPIRATORY_TRACT | Status: DC
  Administered 2022-06-10 – 2022-06-14 (×5): 1 via RESPIRATORY_TRACT
  Filled 2022-06-09: qty 28

## 2022-06-09 MED ORDER — ACETAMINOPHEN 325 MG PO TABS
650.0000 mg | ORAL_TABLET | Freq: Four times a day (QID) | ORAL | Status: DC | PRN
  Administered 2022-06-09 – 2022-06-10 (×2): 650 mg via ORAL
  Filled 2022-06-09 (×2): qty 2

## 2022-06-09 MED ORDER — ALBUTEROL SULFATE (2.5 MG/3ML) 0.083% IN NEBU: 3.0000 mL | INHALATION_SOLUTION | Freq: Four times a day (QID) | RESPIRATORY_TRACT | Status: DC | PRN

## 2022-06-09 MED ORDER — ATORVASTATIN CALCIUM 40 MG PO TABS
40.0000 mg | ORAL_TABLET | Freq: Every day | ORAL | Status: DC
  Administered 2022-06-09 – 2022-06-14 (×6): 40 mg via ORAL
  Filled 2022-06-09 (×6): qty 1

## 2022-06-09 MED ORDER — LINACLOTIDE 72 MCG PO CAPS
72.0000 ug | ORAL_CAPSULE | Freq: Every day | ORAL | Status: DC
  Administered 2022-06-10 – 2022-06-14 (×5): 72 ug via ORAL
  Filled 2022-06-09 (×6): qty 1

## 2022-06-09 MED ORDER — PANTOPRAZOLE SODIUM 40 MG PO TBEC
40.0000 mg | DELAYED_RELEASE_TABLET | Freq: Every day | ORAL | Status: DC
  Administered 2022-06-10 – 2022-06-14 (×5): 40 mg via ORAL
  Filled 2022-06-09 (×5): qty 1

## 2022-06-09 MED ORDER — LORATADINE 10 MG PO TABS
10.0000 mg | ORAL_TABLET | Freq: Every day | ORAL | Status: DC
  Administered 2022-06-10 – 2022-06-14 (×5): 10 mg via ORAL
  Filled 2022-06-09 (×5): qty 1

## 2022-06-09 MED ORDER — GABAPENTIN 300 MG PO CAPS
600.0000 mg | ORAL_CAPSULE | Freq: Two times a day (BID) | ORAL | Status: DC
  Administered 2022-06-10 – 2022-06-14 (×9): 600 mg via ORAL
  Filled 2022-06-09 (×9): qty 2

## 2022-06-09 MED ORDER — BUSPIRONE HCL 10 MG PO TABS
10.0000 mg | ORAL_TABLET | Freq: Three times a day (TID) | ORAL | Status: DC
  Administered 2022-06-09 – 2022-06-14 (×14): 10 mg via ORAL
  Filled 2022-06-09 (×14): qty 1

## 2022-06-09 MED ORDER — BACLOFEN 10 MG PO TABS
10.0000 mg | ORAL_TABLET | Freq: Three times a day (TID) | ORAL | Status: DC
  Administered 2022-06-09 – 2022-06-14 (×14): 10 mg via ORAL
  Filled 2022-06-09 (×14): qty 1

## 2022-06-09 MED ORDER — ENOXAPARIN SODIUM 60 MG/0.6ML IJ SOSY
60.0000 mg | PREFILLED_SYRINGE | INTRAMUSCULAR | Status: DC
  Administered 2022-06-10 – 2022-06-13 (×4): 60 mg via SUBCUTANEOUS
  Filled 2022-06-09 (×4): qty 0.6

## 2022-06-09 MED ORDER — DILTIAZEM HCL ER COATED BEADS 120 MG PO CP24
120.0000 mg | ORAL_CAPSULE | Freq: Two times a day (BID) | ORAL | Status: DC
  Administered 2022-06-09 – 2022-06-14 (×10): 120 mg via ORAL
  Filled 2022-06-09 (×10): qty 1

## 2022-06-09 MED ORDER — LOSARTAN POTASSIUM 50 MG PO TABS
25.0000 mg | ORAL_TABLET | Freq: Every day | ORAL | Status: DC
  Administered 2022-06-09 – 2022-06-13 (×5): 25 mg via ORAL
  Filled 2022-06-09 (×5): qty 1

## 2022-06-09 MED ORDER — ENOXAPARIN SODIUM 40 MG/0.4ML IJ SOSY: 40.0000 mg | PREFILLED_SYRINGE | INTRAMUSCULAR | Status: DC

## 2022-06-09 MED ORDER — UMECLIDINIUM BROMIDE 62.5 MCG/ACT IN AEPB
1.0000 | INHALATION_SPRAY | Freq: Every day | RESPIRATORY_TRACT | Status: DC
  Administered 2022-06-10 – 2022-06-14 (×5): 1 via RESPIRATORY_TRACT
  Filled 2022-06-09: qty 7

## 2022-06-09 MED ORDER — NICOTINE 21 MG/24HR TD PT24
21.0000 mg | MEDICATED_PATCH | Freq: Every day | TRANSDERMAL | Status: DC
  Filled 2022-06-09 (×3): qty 1

## 2022-06-09 MED ORDER — TAB-A-VITE/IRON PO TABS
1.0000 | ORAL_TABLET | Freq: Two times a day (BID) | ORAL | Status: DC
  Administered 2022-06-10 – 2022-06-14 (×9): 1 via ORAL
  Filled 2022-06-09 (×11): qty 1

## 2022-06-09 MED ORDER — GABAPENTIN 300 MG PO CAPS
900.0000 mg | ORAL_CAPSULE | Freq: Every day | ORAL | Status: DC
  Administered 2022-06-09 – 2022-06-13 (×5): 900 mg via ORAL
  Filled 2022-06-09 (×5): qty 3

## 2022-06-09 MED ORDER — CALCIUM CARBONATE 1250 (500 CA) MG PO TABS
1.0000 | ORAL_TABLET | Freq: Two times a day (BID) | ORAL | Status: DC
  Administered 2022-06-10 – 2022-06-14 (×9): 1250 mg via ORAL
  Filled 2022-06-09 (×9): qty 1

## 2022-06-09 MED ORDER — FLUTICASONE PROPIONATE 50 MCG/ACT NA SUSP
2.0000 | Freq: Two times a day (BID) | NASAL | Status: DC
  Administered 2022-06-10 – 2022-06-14 (×7): 2 via NASAL
  Filled 2022-06-09: qty 16

## 2022-06-09 MED ORDER — VENLAFAXINE HCL ER 75 MG PO CP24
75.0000 mg | ORAL_CAPSULE | Freq: Every day | ORAL | Status: DC
  Administered 2022-06-10 – 2022-06-14 (×5): 75 mg via ORAL
  Filled 2022-06-09 (×6): qty 1

## 2022-06-09 MED ORDER — BUPRENORPHINE HCL 900 MCG BU FILM
1.0000 | ORAL_FILM | Freq: Two times a day (BID) | BUCCAL | Status: DC
  Administered 2022-06-10 – 2022-06-14 (×8): 1 via BUCCAL
  Filled 2022-06-09: qty 1

## 2022-06-09 NOTE — ED Notes (Signed)
ED TO INPATIENT HANDOFF REPORT  ED Nurse Name and Phone #: Joselyn Glassman [phone])  S Name/Age/Gender [name] 57 y.o. female Room/Bed: H015C/H015C  Code Status   Code Status: Full Code  Home/SNF/Other Home Patient oriented to: self, place, time, and situation Is this baseline? Yes   Triage Complete: Triage complete  Chief Complaint Acute on chronic heart failure with preserved ejection fraction (HFpEF) (HCC) [I50.33] Acute exacerbation of CHF (congestive heart failure) (HCC) [I50.9]  Triage Note Pt BIB [facility] for a fall that occurred this morning at 0700, EMS assisted her back to bed, refused transport at that time. Pt called EMS this afternoon after she noticed injury to her pinky toe on her left foot. PT also c/o lower back pain. Pt has hx of CHF, 84 % on room air and 94% on 2 L. Pt states that she has been off supplemental oxygen for a year but her provider has discussed doing a home test with her.    Allergies Allergies  Allergen Reactions   Aspirin Other (See Comments)    Kidney disease   Methadone Hcl Other (See Comments)     "blacked out" in 1990's   Levofloxacin Itching    Level of Care/Admitting Diagnosis ED Disposition     ED Disposition  Admit   Condition  --   Comment  Hospital Area: [facility] [[id number]]  Level of Care: Telemetry Medical [[id number]]  May place patient in observation at [facility] or [facility] if equivalent level of care is available:: Yes  Covid Evaluation: Asymptomatic - no recent exposure (last 10 days) testing not required  Diagnosis: Acute exacerbation of CHF (congestive heart failure) [facility]) [[id number]]  Admitting Physician: Lance Muss [[id number]]  Attending Physician: Ivery Quale          B Medical/Surgery History Past Medical History:  Diagnosis Date   Alcoholism in remission (HCC)    11-26-2021  pt stated no alcohol few yrs ago   Asthma    Bipolar disorder (HCC)    Chronic diastolic  CHF (congestive heart failure) (HCC) 2014   followed by dr b. Cristal Deer;   Chronic hypoxemic respiratory failure [facility])    pulmonologist--- dr Tonia Brooms;   (11-26-2021 per pt was on supplemental oxygen until she 01/ 2022 did not need it,  does not check O2 sats at home anymore.   CKD (chronic kidney disease), stage III [facility])    nephrologist--- dr Ronalee Belts   COPD (chronic obstructive pulmonary disease) (HCC)    11-26-2021 pt trying to quit smoking ,  started age 68   Depression    Diabetes mellitus type 2, diet-controlled (HCC)    followed by pcp   (11-26-2021  per pt last A1c by pcp on 10-30-2021 7.8,  stated does not check blood sugar)   Dyspnea    Fibrocystic breast disease    GAD (generalized anxiety disorder)    GERD (gastroesophageal reflux disease)    Hepatitis B surface antigen positive 11/2017   pt stated asymptomatic ,  no treatment   History of cervical dysplasia 04/10/2011   '93- once dx.-got pregnant-no intervention, then postpartum, no dysplasia found   History of condyloma acuminatum 04/09/2011   Hyperlipidemia    Hypertension    Incomplete left bundle branch block (LBBB)    Lung nodule 11/2017   pulmonologist--- dr icard;  RUL and RLL   Muscle spasm of both lower legs    chronic , due to back issues,   OA (osteoarthritis)  04/10/2011   hips, shoulders, back   OSA (obstructive sleep apnea) 2009   study on epic 09-18-2007 mild osa w/ hypoxmia ;   (11-26-2021 per pt last used cpap 01/ 2022 before bariatric surgery 04/ 2022 stated lost wt / and stopped needing supplemental oxygen , did not need anymore)   Polyneuropathy    due to back issues and DM   Polysubstance abuse (HCC)    11-26-2021  pt hx cocaine use stated none for yrs but ED visit 08-14-2021  positive UDS,  pt stated it was once only for depression none since   PSVT (paroxysmal supraventricular tachycardia)    followed by cardiology   Spinal stenosis, unspecified spinal region    Urge incontinence of urine     Wears dentures    upper   Past Surgical History:  Procedure Laterality Date   ANAL FISTULECTOMY  04/17/2011   Procedure: FISTULECTOMY ANAL;  Surgeon: Wilmon Arms. Corliss Skains, MD;  Location: [facility];  Service: General;  Laterality: N/A;  Excision of Condyloma Gluteal Cleft    ANAL RECTAL MANOMETRY N/A 03/26/2019   Procedure: ANO RECTAL MANOMETRY;  Surgeon: Tressia Danas, MD;  Location: [facility];  Service: Gastroenterology;  Laterality: N/A;   BIOPSY  03/09/2019   Procedure: BIOPSY;  Surgeon: Tressia Danas, MD;  Location: [facility];  Service: Gastroenterology;;   BOTOX INJECTION N/A 12/29/2018   Procedure: BOTOX INJECTION(100 UNITS)  WITH CYSTOSCOPY;  Surgeon: Rene Paci, MD;  Location: [facility];  Service: Urology;  Laterality: N/A;   BOTOX INJECTION N/A 07/27/2019   Procedure: BOTOX INJECTION WITH CYSTOSCOPY;  Surgeon: Rene Paci, MD;  Location: [facility];  Service: Urology;  Laterality: N/A;  ONLY NEEDS 30 MIN   BOTOX INJECTION N/A 05/02/2021   Procedure: BOTOX INJECTION WITH CYSTOSCOPY;  Surgeon: Rene Paci, MD;  Location: [facility];  Service: Urology;  Laterality: N/A;  ONLY NEEDS 30 MIN   BOTOX INJECTION N/A 11/28/2021   Procedure: BOTOX INJECTION WITH CYSTOSCOPY, 200 UNITS;  Surgeon: Rene Paci, MD;  Location: [facility];  Service: Urology;  Laterality: N/A;  ONLY NEEDS 30 MIN   BOTOX INJECTION N/A 05/15/2022   Procedure: BOTOX INJECTION 200 UNITS;  Surgeon: Rene Paci, MD;  Location: [facility];  Service: Urology;  Laterality: N/A;   BREAST EXCISIONAL BIOPSY Right    x 3   BREAST EXCISIONAL BIOPSY Left    12-29-2000  and 11-30-2001 @[facility]  by dr Carolynne Edouard   CARPAL TUNNEL RELEASE Bilateral    right 08-30-2002 and left 07-04-2003  by dr Otelia Sergeant @ [facility]   CATARACT EXTRACTION W/ INTRAOCULAR LENS IMPLANT Bilateral 12/2020   CERVICAL FUSION  04/10/2011   x3- cervical fusion with  plating and screws-Dr. Noel Gerold   COLONOSCOPY WITH PROPOFOL N/A 07/06/2015   Procedure: COLONOSCOPY WITH PROPOFOL;  Surgeon: Dorena Cookey, MD;  [facility];  Service: Endoscopy;  Laterality: N/A;   COLONOSCOPY WITH PROPOFOL N/A 03/09/2019   Procedure: COLONOSCOPY WITH PROPOFOL;  Surgeon: Tressia Danas, MD;  [facility]: [facility];  Service: Gastroenterology;  Laterality: N/A;   CYSTOSCOPY N/A 05/15/2022   Procedure: CYSTOSCOPY;  Surgeon: Rene Paci, MD;  Location: [facility];  Service: Urology;  Laterality: N/A;   FRACTURE SURGERY     little toe left foot   I & D EXTREMITY Left 12/18/2016   Procedure: IRRIGATION AND DEBRIDEMENT LEFT FOOT, CLOSURE;  Surgeon: Tarry Kos, MD;  Location: [facility];  Service: Orthopedics;  Laterality: Left;   INCISION AND DRAINAGE ABSCESS Right 05/03/2002   @[facility]  by dr Carolynne Edouard;   right breast abscess   INJECTION KNEE Right 10/03/2020   Procedure: KNEE INJECTION;  Surgeon: Kerrin Champagne, MD;  [facility];  Service: Orthopedics;  Laterality: Right;   KNEE ARTHROSCOPY WITH MEDIAL MENISECTOMY Right 03/21/2021   Procedure: RIGHT KNEE ARTHROSCOPY WITH MEDIAL AND PARTIAL LATERAL MENISCECTOMY;  Surgeon: Tarry Kos, MD;  Location: [facility];  Service: Orthopedics;  Laterality: Right;   LAPAROSCOPIC GASTRIC SLEEVE RESECTION N/A 05/08/2020   Procedure: LAPAROSCOPIC GASTRIC SLEEVE RESECTION;  Surgeon: Sheliah Hatch De Blanch, MD;  Location: [facility];  Service: General;  Laterality: N/A;   LUMBAR DISC SURGERY  04/10/2011   x5-Lumbar fusion-retained hardware.(Dr. Otelia Sergeant)   LUMBAR LAMINECTOMY N/A 10/03/2020   Procedure: THORACOLUMBAR LAMINECTOMIES THORACIC ELEVEN-TWELVE, THORACIC TWELVE-LUMBAR ONE  AND LUMBAT ONE-TWO;  Surgeon: Kerrin Champagne, MD;  Location: [facility];  Service: Orthopedics;  Laterality: N/A;   LUMBAR PERCUTANEOUS PEDICLE SCREW 2 LEVEL  06/29/2021   Procedure: LUMBAR PERCUTANEOUS PEDICLE SCREW LUMBAR ONE-TWO;   Surgeon: Julio Sicks, MD;  Location: [facility];  Service: Neurosurgery;;   MULTIPLE TOOTH EXTRACTIONS     POLYPECTOMY  03/09/2019   Procedure: POLYPECTOMY;  Surgeon: Tressia Danas, MD;  [facility];  Service: Gastroenterology;;   POSTERIOR LUMBAR FUSION  04/04/2005   @[facility]  by dr Otelia Sergeant;   L3-- L5   POSTERIOR LUMBAR FUSION  03/04/2006   @[facility]  dr Otelia Sergeant;   fusion L2--3 and redo L3--5   SHOULDER ARTHROSCOPY WITH ROTATOR CUFF REPAIR AND SUBACROMIAL DECOMPRESSION Left 05/07/2019   Procedure: LEFT SHOULDER ARTHROSCOPY WITH DEBRIDEMENT, DISTAL CLAVICLE EXCISION,  SUBACROMIAL DECOMPRESSION AND POSSIBLE ROTATOR CUFF REPAIR;  Surgeon: Tarry Kos, MD;  Location: [facility];  Service: Orthopedics;  Laterality: Left;   TEE WITHOUT CARDIOVERSION N/A 04/06/2012   Procedure: TRANSESOPHAGEAL ECHOCARDIOGRAM (TEE);  Surgeon: Chrystie Nose, MD;  Location[facility] END[facility];  Service: Cardiovascular;  Laterality: N/A;   TRIGGER FINGER RELEASE Right    middle finger   TRIGGER FINGER RELEASE Left 06/28/2016   Procedure: RELEASE TRIGGER FINGER LEFT 3RD FINGER;  Surgeon: Tarry Kos, MD;  [facility];  Service: Orthopedics;  Laterality: Left;   TRIGGER FINGER RELEASE Right 06/12/2016   Procedure: RIGHT INDEX FINGER TRIGGER RELEASE;  Surgeon: Tarry Kos, MD;  [facility];  Service: Orthopedics;  Laterality: Right;   TRIGGER FINGER RELEASE Right 01/19/2018   Procedure: RIGHT RING FINGER TRIGGER FINGER RELEASE;  Surgeon: Tarry Kos, MD;  [facility];  Service: Orthopedics;  Laterality: Right;   TRIGGER FINGER RELEASE Left 05/07/2019   Procedure: RELEASE TRIGGER FINGER LEFT INDEX FINGER;  Surgeon: Tarry Kos, MD;  [facility];  Service: Orthopedics;  Laterality: Left;   UPPER GI ENDOSCOPY N/A 05/08/2020   Procedure: UPPER GI ENDOSCOPY;  Surgeon: Sheliah Hatch De Blanch, MD;  [facility];  Service: General;  Laterality: N/A;     A IV Location/Drains/Wounds Patient Lines/Drains/Airways  Status     Active Line/Drains/Airways     None            Intake/Output Last 24 hours No intake or output data in the 24 hours ending 06/09/22 2059  Labs/Imaging Results for orders placed or performed during the hospital encounter of 06/09/22 (from the past 48 hour(s))  Brain natriuretic peptide     Status: Abnormal   Collection Time: 06/09/22  5:00 PM  Result Value Ref Range  B Natriuretic Peptide 1,336.9 (H) 0.0 - 100.0 pg/mL    Comment: Performed at [facility], [address], [place], [place] [address]  CBC with Differential     Status: Abnormal   Collection Time: 06/09/22  5:00 PM  Result Value Ref Range   WBC 12.4 (H) 4.0 - 10.5 K/uL   RBC 6.87 (H) 3.87 - 5.11 MIL/uL   Hemoglobin 17.5 (H) 12.0 - 15.0 g/dL   HCT 09.8 (H) 11.9 - 14.7 %   MCV 76.0 (L) 80.0 - 100.0 fL   MCH 25.5 (L) 26.0 - 34.0 pg   MCHC 33.5 30.0 - 36.0 g/dL   RDW 82.9 (H) 56.2 - 13.0 %   Platelets 364 150 - 400 K/uL   nRBC 0.2 0.0 - 0.2 %   Neutrophils Relative % 76 %   Neutro Abs 9.4 (H) 1.7 - 7.7 K/uL   Lymphocytes Relative 14 %   Lymphs Abs 1.7 0.7 - 4.0 K/uL   Monocytes Relative 8 %   Monocytes Absolute 1.0 0.1 - 1.0 K/uL   Eosinophils Relative 1 %   Eosinophils Absolute 0.1 0.0 - 0.5 K/uL   Basophils Relative 1 %   Basophils Absolute 0.1 0.0 - 0.1 K/uL   Immature Granulocytes 0 %   Abs Immature Granulocytes 0.04 0.00 - 0.07 K/uL    Comment: Performed at [facility], [address] [place], [place] [address]  Comprehensive metabolic panel     Status: Abnormal   Collection Time: 06/09/22  5:00 PM  Result Value Ref Range   Sodium 140 135 - 145 mmol/L   Potassium 4.1 3.5 - 5.1 mmol/L   Chloride 99 98 - 111 mmol/L   CO2 29 22 - 32 mmol/L   Glucose, Bld 87 70 - 99 mg/dL    Comment: Glucose reference range applies only to samples taken after fasting for at least 8 hours.   BUN 23 (H) 6 - 20 mg/dL   Creatinine, Ser 4.69 (H) 0.44 - 1.00 mg/dL   Calcium 9.2 8.9 - 62.9 mg/dL    Total Protein 6.2 (L) 6.5 - 8.1 g/dL   Albumin 3.5 3.5 - 5.0 g/dL   AST 24 15 - 41 U/L   ALT 21 0 - 44 U/L   Alkaline Phosphatase 89 38 - 126 U/L   Total Bilirubin 0.9 0.3 - 1.2 mg/dL   GFR, Estimated 51 (L) >60 mL/min    Comment: (NOTE) Calculated using the CKD-EPI Creatinine Equation (2021)    Anion gap 12 5 - 15    Comment: Performed at [facility], [address], [place], [place] [address]  Troponin I (High Sensitivity)     Status: None   Collection Time: 06/09/22  5:00 PM  Result Value Ref Range   Troponin I (High Sensitivity) 4 <18 ng/L    Comment: (NOTE) Elevated high sensitivity troponin I (hsTnI) values and significant  changes across serial measurements may suggest ACS but many other  chronic and acute conditions are known to elevate hsTnI results.  Refer to the "Links" section for chest pain algorithms and additional  guidance. Performed at [facility], [address] [place], [place] [address]    *Note: Due to a large number of results and/or encounters for the requested time period, some results have not been displayed. A complete set of results can be found in Results Review.   DG Knee Complete 4 Views Right  Result Date: 06/09/2022 CLINICAL DATA:  Knee pain, giving out leading to fall. EXAM: RIGHT KNEE - COMPLETE 4+ VIEW COMPARISON:  Radiograph 10/11/2021 FINDINGS: No acute fracture or dislocation. Lateral tibiofemoral joint space narrowing. Mild to moderate tricompartmental peripheral spurring. Corticated density adjacent to the medial femoral condyle may represent sequela of remote MCL injury. Minimal knee joint effusion, although diminished from prior exam. Small quadriceps tendon enthesophyte. Mild soft tissue edema. IMPRESSION: 1. No acute fracture or dislocation. 2. Mild to moderate tricompartmental osteoarthritis. Minimal knee joint effusion, diminished from prior exam. Electronically Signed   By: Narda Rutherford M.D.   On: 06/09/2022 17:01    DG Foot Complete Left  Result Date: 06/09/2022 CLINICAL DATA:  Foot pain status post falls. EXAM: LEFT FOOT - COMPLETE 3+ VIEW COMPARISON:  Left foot radiograph 12/30/2018 FINDINGS: There is no evidence of fracture or dislocation. Distal osteopenia. Chronic deformities of the head of the third metatarsal and base of the fifth toe proximal phalanx. Mild osteoarthritis of the great toe MTP joint and moderate osteoarthritis in the midfoot. Os peroneum. Thin calcification noted at the insertion of the Achilles tendon. Vascular calcifications seen in the dorsal midfoot. IMPRESSION: 1. No acute fracture or dislocation. 2. Chronic deformities of the third metatarsal head and fifth toe proximal phalanx. 3. Osteoarthritis in the midfoot and at the great toe MTP joint, as described in the body of the report. Electronically Signed   By: Sherron Ales M.D.   On: 06/09/2022 15:27   DG Chest 1 View  Result Date: 06/09/2022 CLINICAL DATA:  Recent fall. EXAM: CHEST  1 VIEW COMPARISON:  Chest x-ray August 13, 2021 FINDINGS: No pneumothorax. Stable cardiomegaly. No overt edema. No nodules or masses. No focal infiltrates. IMPRESSION: No active disease. Electronically Signed   By: Gerome Sam III M.D.   On: 06/09/2022 15:23    Pending Labs Unresulted Labs (From admission, onward)     Start     Ordered   06/16/22 0500  Creatinine, serum  (enoxaparin (LOVENOX)    CrCl >/= 30 ml/min)  Weekly,   R     Comments: while on enoxaparin therapy    06/09/22 2052   06/10/22 0500  Hemoglobin A1c  Tomorrow morning,   R        06/09/22 2052   06/10/22 0500  CBC  Tomorrow morning,   R        06/09/22 2052   06/10/22 0500  Comprehensive metabolic panel  Tomorrow morning,   R        06/09/22 2052   06/09/22 2046  CBC  (enoxaparin (LOVENOX)    CrCl >/= 30 ml/min)  Once,   R       Comments: Baseline for enoxaparin therapy IF NOT ALREADY DRAWN.  Notify MD if PLT < 100 K.    06/09/22 2052   06/09/22 2046  Creatinine, serum   (enoxaparin (LOVENOX)    CrCl >/= 30 ml/min)  Once,   R       Comments: Baseline for enoxaparin therapy IF NOT ALREADY DRAWN.    06/09/22 2052            Vitals/Pain Today's Vitals   06/09/22 1416 06/09/22 1715 06/09/22 1833 06/09/22 1849  BP:   (!) 150/84   Pulse:   68   Resp:   16   Temp:      TempSrc:      SpO2:   90%   Weight:  127 kg    Height:  5\' 9"  (1.753 m)  PainSc: 6    Asleep    Isolation Precautions No active isolations  Medications Medications  furosemide (LASIX) injection 40 mg (has no administration in time range)  enoxaparin (LOVENOX) injection 40 mg (has no administration in time range)  acetaminophen (TYLENOL) tablet 650 mg (has no administration in time range)    Or  acetaminophen (TYLENOL) suppository 650 mg (has no administration in time range)  nicotine (NICODERM CQ - dosed in mg/24 hours) patch 21 mg (has no administration in time range)  oxyCODONE (Oxy IR/ROXICODONE) immediate release tablet 10 mg (10 mg Oral Given 06/09/22 1710)    Mobility walks with device     Focused Assessments    R Recommendations: See Admitting Provider Note  Report given to:   Additional Notes:

## 2022-06-09 NOTE — ED Notes (Signed)
Pt received for care at 1910.  Quietly resting in bed; respirations even and unlabored.  This RN introduced self to pt.  Bed in lowest position, wheels locked.  Call bell within reach. 

## 2022-06-09 NOTE — ED Notes (Signed)
Pt transferred from ED to inpatient unit at this time via stretcher with all belongings.

## 2022-06-09 NOTE — ED Notes (Signed)
Pt cleaned and changed into new brief at this time.

## 2022-06-09 NOTE — ED Provider Notes (Signed)
[place] EMERGENCY DEPARTMENT AT [facility] Provider Note   CSN: [id number] Arrival date & time: 06/09/22  1401     History  Chief Complaint  Patient presents with   [name] is a 57 y.o. female.  The history is provided by the patient and medical records. No language interpreter was used.  Fall This is a recurrent problem. The current episode started 6 to 12 hours ago. The problem occurs constantly. The problem has not changed since onset.Associated symptoms include shortness of breath. Pertinent negatives include no chest pain, no abdominal pain and no headaches. Nothing aggravates the symptoms. Nothing relieves the symptoms.       Home Medications Prior to Admission medications   Medication Sig Start Date End Date Taking? Authorizing Provider  ACCU-CHEK AVIVA PLUS test strip 1 EACH BY OTHER ROUTE 3 (THREE) TIMES DAILY. TEST 3 TIMES DAILY 07/26/21   Celedonio Savage, MD  acetaminophen (TYLENOL) 500 MG tablet Take 1,000 mg by mouth 2 (two) times daily.    [provider]  albuterol (VENTOLIN HFA) 108 (90 Base) MCG/ACT inhaler TAKE 2 PUFFS BY MOUTH EVERY 6 HOURS AS NEEDED FOR WHEEZE OR SHORTNESS OF BREATH Strength: 108 (90 Base) MCG/ACT Patient taking differently: Inhale 1-2 puffs into the lungs every 6 (six) hours as needed for wheezing or shortness of breath. TAKE 2 PUFFS BY MOUTH EVERY 6 HOURS AS NEEDED FOR WHEEZE OR SHORTNESS OF BREATH Strength: 108 (90 Base) MCG/ACT 08/28/21   Jerre Simon, MD  ammonium lactate (LAC-HYDRIN) 12 % lotion For dry skin Patient taking differently: Apply 1 Application topically 2 (two) times daily. For dry skin 08/29/21   Jerre Simon, MD  atorvastatin (LIPITOR) 40 MG tablet TAKE ONE TABLET BY MOUTH EVERY DAY Patient taking differently: Take 40 mg by mouth daily. 05/02/22   Jerre Simon, MD  BELBUCA 900 MCG FILM Place 1 Film inside cheek 2 (two) times daily. 03/11/22   [provider]  busPIRone (BUSPAR)  10 MG tablet TAKE ONE TABLET BY MOUTH THREE TIMES DAILY Patient taking differently: Take 10 mg by mouth 3 (three) times daily. 02/19/22   Jerre Simon, MD  calcium carbonate (OSCAL) 1500 (600 Ca) MG TABS tablet Take 1,500 mg by mouth 2 (two) times daily.    [provider]  diclofenac Sodium (VOLTAREN) 1 % GEL APPLY FOUR grams FOUR TIMES DAILY AS DIRECTED 05/10/22   Jerre Simon, MD  diltiazem (CARDIZEM CD) 120 MG 24 hr capsule Take 1 capsule (120 mg total) by mouth 2 (two) times daily. 01/08/22   Jerre Simon, MD  fluticasone (FLONASE) 50 MCG/ACT nasal spray INSTILL TWO SPRAYS INTO BOTH NOSTRILS TWICE DAILY Patient taking differently: Place 2 sprays into both nostrils 2 (two) times daily. INSTILL TWO SPRAYS INTO BOTH NOSTRILS TWICE DAILY 10/03/21   Jerre Simon, MD  gabapentin (NEURONTIN) 300 MG capsule TAKE TWO CAPSULES BY MOUTH EVERY MORNING, AND EVERY LUNCH, ant TAKE THREE CAPSULES BY MOUTH EVERY IN THE EVENING 05/13/22   Jerre Simon, MD  hydrOXYzine (ATARAX) 10 MG tablet Take 1 tablet (10 mg total) by mouth daily. Patient taking differently: Take 10 mg by mouth daily. 05/07/22   Jerre Simon, MD  ketoconazole (NIZORAL) 2 % cream Apply 1 Application topically daily. 11/21/21   Helane Gunther, DPM  linaclotide (LINZESS) 72 MCG capsule Take 72 mcg by mouth daily before breakfast.    [provider]  loratadine (CLARITIN) 10 MG tablet Take 1 tablet (10 mg total)  by mouth daily. 08/28/21   Jerre Simon, MD  losartan (COZAAR) 25 MG tablet Take 1 tablet (25 mg total) by mouth at bedtime. Patient taking differently: Take 25 mg by mouth at bedtime. 12/04/21   Jerre Simon, MD  methocarbamol (ROBAXIN) 500 MG tablet TAKE ONE TABLET BY MOUTH EVERY 6 HOURS AS NEEDED FOR MUSCLE SPASMS Patient taking differently: Take 500 mg by mouth every 6 (six) hours as needed for muscle spasms. 05/05/22   Jerre Simon, MD  metolazone (ZAROXOLYN) 2.5 MG tablet Take 1 tablet (2.5 mg total) by mouth 2 (two)  times a week. Mondays and Thursdays Patient taking differently: Take 2.5 mg by mouth 2 (two) times a week. Mondays and Thursdays 09/10/21   Jodelle Red, MD  montelukast (SINGULAIR) 10 MG tablet TAKE 1 TABLET BY MOUTH EVERYDAY AT BEDTIME Strength: 10 mg Patient taking differently: Take 10 mg by mouth at bedtime. TAKE 1 TABLET BY MOUTH EVERYDAY AT BEDTIME Strength: 10 mg 08/15/21   Jerre Simon, MD  Multiple Vitamins-Minerals (BARIATRIC MULTIVITAMINS/IRON) CAPS Take 1 capsule by mouth 2 (two) times daily.    [provider]  NARCAN 4 MG/0.1ML LIQD nasal spray kit Place 0.4 mg into the nose once as needed (accidental overdose). 02/16/18   [provider]  nystatin (MYCOSTATIN) 100000 UNIT/ML suspension Take 5 mLs (500,000 Units total) by mouth 4 (four) times daily. Swish and swallow. Patient taking differently: Take 5 mLs by mouth 4 (four) times daily as needed. Swish and swallow. 03/27/22   Jerre Simon, MD  omeprazole (PRILOSEC) 20 MG capsule TAKE ONE CAPSULE BY MOUTH EVERY DAY 04/15/22   Jerre Simon, MD  potassium chloride SA (KLOR-CON M) 20 MEQ tablet Take 20 mEq by mouth daily. 08/13/21   [provider]  TRELEGY ELLIPTA 100-62.5-25 MCG/ACT AEPB Inhale 1 puff into the lungs daily. 03/01/22   Jerre Simon, MD  venlafaxine XR (EFFEXOR-XR) 75 MG 24 hr capsule Take 1 capsule (75 mg total) by mouth daily with breakfast. 02/19/22   Jerre Simon, MD      Allergies    Aspirin, Methadone hcl, and Levofloxacin    Review of Systems   Review of Systems  Constitutional:  Negative for chills, fatigue and fever.  HENT:  Negative for congestion.   Respiratory:  Positive for shortness of breath. Negative for cough, chest tightness and wheezing.   Cardiovascular:  Positive for leg swelling. Negative for chest pain and palpitations.  Gastrointestinal:  Negative for abdominal pain, constipation, diarrhea, nausea and vomiting.  Genitourinary:  Negative for dysuria.   Musculoskeletal:  Negative for back pain.  Skin:  Negative for rash and wound.  Neurological:  Negative for light-headedness and headaches.  Psychiatric/Behavioral:  Negative for agitation and confusion.   All other systems reviewed and are negative.   Physical Exam Updated Vital Signs BP (!) 157/93 (BP Location: Left Arm)   Pulse 95   Temp 98.3 F (36.8 C) (Oral)   Resp 20   LMP 01/29/1992   SpO2 93%  Physical Exam Vitals and nursing note reviewed.  Constitutional:      General: She is not in acute distress.    Appearance: She is well-developed. She is not ill-appearing, toxic-appearing or diaphoretic.  HENT:     Head: Normocephalic and atraumatic.     Nose: No congestion or rhinorrhea.     Mouth/Throat:     Mouth: Mucous membranes are moist.     Pharynx: No oropharyngeal exudate or posterior oropharyngeal erythema.  Eyes:  Extraocular Movements: Extraocular movements intact.     Conjunctiva/sclera: Conjunctivae normal.     Pupils: Pupils are equal, round, and reactive to light.  Cardiovascular:     Rate and Rhythm: Normal rate and regular rhythm.     Heart sounds: No murmur heard. Pulmonary:     Effort: Pulmonary effort is normal. No respiratory distress.     Breath sounds: Rales present. No wheezing or rhonchi.  Chest:     Chest wall: No tenderness.  Abdominal:     General: Abdomen is flat.     Palpations: Abdomen is soft.     Tenderness: There is no abdominal tenderness. There is no guarding or rebound.  Musculoskeletal:        General: Tenderness present. No swelling.     Cervical back: Neck supple. No tenderness.     Right lower leg: Edema present.     Left lower leg: Edema present.       Legs:  Skin:    General: Skin is warm and dry.     Capillary Refill: Capillary refill takes less than 2 seconds.     Findings: No erythema or rash.  Neurological:     General: No focal deficit present.     Mental Status: She is alert.  Psychiatric:        Mood and  Affect: Mood normal.     ED Results / Procedures / Treatments   Labs (all labs ordered are listed, but only abnormal results are displayed) Labs Reviewed  BRAIN NATRIURETIC PEPTIDE - Abnormal; Notable for the following components:      Result Value   B Natriuretic Peptide 1,336.9 (*)    All other components within normal limits  CBC WITH DIFFERENTIAL/PLATELET - Abnormal; Notable for the following components:   WBC 12.4 (*)    RBC 6.87 (*)    Hemoglobin 17.5 (*)    HCT 52.2 (*)    MCV 76.0 (*)    MCH 25.5 (*)    RDW 18.9 (*)    Neutro Abs 9.4 (*)    All other components within normal limits  COMPREHENSIVE METABOLIC PANEL - Abnormal; Notable for the following components:   BUN 23 (*)    Creatinine, Ser 1.22 (*)    Total Protein 6.2 (*)    GFR, Estimated 51 (*)    All other components within normal limits  CBC - Abnormal; Notable for the following components:   WBC 12.3 (*)    RBC 6.94 (*)    Hemoglobin 17.2 (*)    HCT 52.3 (*)    MCV 75.4 (*)    MCH 24.8 (*)    RDW 18.8 (*)    All other components within normal limits  CREATININE, SERUM - Abnormal; Notable for the following components:   Creatinine, Ser 1.12 (*)    GFR, Estimated 57 (*)    All other components within normal limits  CBG MONITORING, ED - Abnormal; Notable for the following components:   Glucose-Capillary 103 (*)    All other components within normal limits  HEMOGLOBIN A1C  CBC  COMPREHENSIVE METABOLIC PANEL  TROPONIN I (HIGH SENSITIVITY)  TROPONIN I (HIGH SENSITIVITY)    EKG None  Radiology DG Knee Complete 4 Views Right  Result Date: 06/09/2022 CLINICAL DATA:  Knee pain, giving out leading to fall. EXAM: RIGHT KNEE - COMPLETE 4+ VIEW COMPARISON:  Radiograph 10/11/2021 FINDINGS: No acute fracture or dislocation. Lateral tibiofemoral joint space narrowing. Mild to moderate tricompartmental peripheral spurring. Corticated density  adjacent to the medial femoral condyle may represent sequela of remote  MCL injury. Minimal knee joint effusion, although diminished from prior exam. Small quadriceps tendon enthesophyte. Mild soft tissue edema. IMPRESSION: 1. No acute fracture or dislocation. 2. Mild to moderate tricompartmental osteoarthritis. Minimal knee joint effusion, diminished from prior exam. Electronically Signed   By: Narda Rutherford M.D.   On: 06/09/2022 17:01   DG Foot Complete Left  Result Date: 06/09/2022 CLINICAL DATA:  Foot pain status post falls. EXAM: LEFT FOOT - COMPLETE 3+ VIEW COMPARISON:  Left foot radiograph 12/30/2018 FINDINGS: There is no evidence of fracture or dislocation. Distal osteopenia. Chronic deformities of the head of the third metatarsal and base of the fifth toe proximal phalanx. Mild osteoarthritis of the great toe MTP joint and moderate osteoarthritis in the midfoot. Os peroneum. Thin calcification noted at the insertion of the Achilles tendon. Vascular calcifications seen in the dorsal midfoot. IMPRESSION: 1. No acute fracture or dislocation. 2. Chronic deformities of the third metatarsal head and fifth toe proximal phalanx. 3. Osteoarthritis in the midfoot and at the great toe MTP joint, as described in the body of the report. Electronically Signed   By: Sherron Ales M.D.   On: 06/09/2022 15:27   DG Chest 1 View  Result Date: 06/09/2022 CLINICAL DATA:  Recent fall. EXAM: CHEST  1 VIEW COMPARISON:  Chest x-ray August 13, 2021 FINDINGS: No pneumothorax. Stable cardiomegaly. No overt edema. No nodules or masses. No focal infiltrates. IMPRESSION: No active disease. Electronically Signed   By: Gerome Sam III M.D.   On: 06/09/2022 15:23    Procedures Procedures    Medications Ordered in ED Medications  acetaminophen (TYLENOL) tablet 650 mg (650 mg Oral Given 06/09/22 2305)    Or  acetaminophen (TYLENOL) suppository 650 mg ( Rectal See Alternative 06/09/22 2305)  nicotine (NICODERM CQ - dosed in mg/24 hours) patch 21 mg (has no administration in time range)   enoxaparin (LOVENOX) injection 60 mg (has no administration in time range)  albuterol (PROVENTIL) (2.5 MG/3ML) 0.083% nebulizer solution 3 mL (has no administration in time range)  atorvastatin (LIPITOR) tablet 40 mg (40 mg Oral Given 06/09/22 2305)  baclofen (LIORESAL) tablet 10 mg (10 mg Oral Given 06/09/22 2305)  Buprenorphine HCl FILM 1 Film (1 Film Buccal Not Given 06/09/22 2306)  busPIRone (BUSPAR) tablet 10 mg (10 mg Oral Given 06/09/22 2305)  venlafaxine XR (EFFEXOR-XR) 24 hr capsule 75 mg (has no administration in time range)  fluticasone (FLONASE) 50 MCG/ACT nasal spray 2 spray (has no administration in time range)  gabapentin (NEURONTIN) capsule 600 mg (has no administration in time range)  calcium carbonate (OS-CAL - dosed in mg of elemental calcium) tablet 1,250 mg (has no administration in time range)  diltiazem (CARDIZEM CD) 24 hr capsule 120 mg (120 mg Oral Given 06/09/22 2305)  linaclotide (LINZESS) capsule 72 mcg (has no administration in time range)  loratadine (CLARITIN) tablet 10 mg (has no administration in time range)  losartan (COZAAR) tablet 25 mg (25 mg Oral Given 06/09/22 2305)  montelukast (SINGULAIR) tablet 10 mg (has no administration in time range)  multivitamins with iron tablet 1 tablet (has no administration in time range)  pantoprazole (PROTONIX) EC tablet 40 mg (has no administration in time range)  fluticasone furoate-vilanterol (BREO ELLIPTA) 100-25 MCG/ACT 1 puff (has no administration in time range)    And  umeclidinium bromide (INCRUSE ELLIPTA) 62.5 MCG/ACT 1 puff (has no administration in time range)  gabapentin (NEURONTIN) capsule 900  mg (900 mg Oral Given 06/09/22 2306)  oxyCODONE (Oxy IR/ROXICODONE) immediate release tablet 10 mg (10 mg Oral Given 06/09/22 1710)  furosemide (LASIX) injection 40 mg (40 mg Intravenous Given 06/09/22 2251)    ED Course/ Medical Decision Making/ A&P                             Medical Decision Making Amount and/or  Complexity of Data Reviewed Labs: ordered. Radiology: ordered.  Risk Prescription drug management. Decision regarding hospitalization.    [name] is a 57 y.o. female with a past medical history significant for hypertension, hyperlipidemia, sleep apnea, CHF, and CKD who presents with peripheral edema, shortness of breath, and pain in the left foot and right knee after a fall.  According to patient, she has had multiple falls recently after her right knee keeps giving out on her.  She reports that she was having some knee pain with it as well on the right side and also injured her left fifth toe.  She reports she did not hit her head and is not having any chest pain new back pain or hip pains.  She reports that she has had more shortness breath and holding onto more fluid despite taking her metolazone as directed.  She was actually calling her doctor to get seen this week for the worsening shortness of breath and does not have oxygen at home.  On arrival to emergency department oxygen saturations found to be in the 80s on room air.  She is now on 2 L to keep her oxygen in the 90s.  She is denying any fevers, chills, congestion, cough, nausea, vomiting, or urinary changes compared to prior.  She reports chronic intermittent constipation diarrhea is unchanged from baseline.  Denies any other medication changes.  She has had trouble with her right knee for quite some time but is hurting worse after this new fall.  On exam, lungs had some faint rales but chest was nontender.  Back was nontender.  Abdomen nontender.  Hips nontender.  She has tenderness in her right knee and tenderness in her left small toe.  She had intact sensation, strength, and pulses initially.  Symmetric smile.  Clear speech.  Pupils symmetric and reactive.  Patient has good pulses in upper extremities.  Due to the hypoxia, dissipate she will need admission for likely diuresis.  Will get a BNP, troponin, EKG, and chest x-ray.   Her chest x-ray did not show new pneumonia and did not show significant edema however clinically I am concerned about some peripheral edema and rales causing her hypoxia.  She had an x-ray of the foot that did not show acute fracture but we will add on an x-ray of the knee.  Will get screening labs.  Due to her new oxygen requirement, anticipate admission for further management.  Will give her oral pain medication to help with the likely musculoskeletal discomfort after the fall.  6:49 PM Workup continues to return.  BNP has risen from 41 to over 1300.  In the setting of her hypoxia, peripheral edema, and shortness of breath I do feel she is admission for diuresis.  Will discuss with medicine what type of diuretic they would like to go with as she has been on the metolazone intermittently without significant relief.  Otherwise her troponin is negative, will trend.  Metabolic panel shows creatinine of 1.22 but otherwise reassuring.  CBC shows mild leukocytosis and hemoconcentration  with hemoglobin 17.5.  The x-ray did not show evidence of acute fracture and foot X-ray did not show fracture or other.  Will admit for further management and diuresis.        Final Clinical Impression(s) / ED Diagnoses Final diagnoses:  Hypoxia  Hypervolemia, unspecified hypervolemia type    Rx / DC Orders ED Discharge Orders     None      Clinical Impression: 1. Hypoxia   2. Hypervolemia, unspecified hypervolemia type     Disposition: Admit  This note was prepared with assistance of Dragon voice recognition software. Occasional wrong-word or sound-a-like substitutions may have occurred due to the inherent limitations of voice recognition software.     Caylie Sandquist, Canary Brim, MD 06/09/22 2356

## 2022-06-09 NOTE — Assessment & Plan Note (Addendum)
Blood pressures  elevated. -Continue home losartan 25 mg -Continue diltiazem 120 mg twice daily

## 2022-06-09 NOTE — ED Notes (Signed)
Pt noted to have occasional desaturation to 82-84% while lying in bed.  Pt states she has been napping on and off and this is probably when her oxygen drops.  Once pt is awake; O2 saturation increases to 89-93% on 4L [place].

## 2022-06-09 NOTE — ED Notes (Signed)
Patient transported to X-ray 

## 2022-06-09 NOTE — ED Triage Notes (Signed)
Pt BIB [facility] for a fall that occurred this morning at 0700, EMS assisted her back to bed, refused transport at that time. Pt called EMS this afternoon after she noticed injury to her pinky toe on her left foot. PT also c/o lower back pain. Pt has hx of CHF, 84 % on room air and 94% on 2 L. Pt states that she has been off supplemental oxygen for a year but her provider has discussed doing a home test with her.

## 2022-06-09 NOTE — H&P (Cosign Needed Addendum)
Hospital Admission History and Physical Service Pager: [phone]  Patient name: [name] Medical record number: [id number] Date of Birth: 1965-05-15 Age: 57 y.o. Gender: female  Primary Care Provider: Jerre Simon, MD Consultants: [facility] Code Status: FULL Preferred Emergency Contact: Presley Raddle (son) [phone]  Chief Complaint: Recent fall and dyspnea  Assessment and Plan: NEILY GABLER is a 57 y.o. female presenting with shortness of breath and fluid overload along with a recent fall. Differential for this patient's presentation of this includes heart failure exacerbation and less likely pneumonia and MI.  Could also consider COPD exacerbation.  * Acute on chronic heart failure with preserved ejection fraction (HFpEF) (HCC) Patient present with SOB and was found to have O2 sat of 90% on 2L [place].  She reports not having a healthy diet recently.  Patient is on metolazone at home and reports taking the medication as prescribed and not missing any doses.  She does not use oxygen at home.  On exam patient has some crackles in her lungs bilaterally and mild bilateral nonpitting edema.  Chest x-ray shows no active disease.  BNP was elevated on admission.  Troponin is WNL.  - Admit to [facility], attending Dr. Deirdre Priest - Given 1 dose Lasix 40 mg IV, redose tomorrow AM with more Lasix versus resuming metolazone - Echocardiogram pending - Oxygen monitoring - Cardiac telemetry - Strict I/O - Daily weights - Vital signs - AM CMP  Type 2 diabetes mellitus with stage 3 chronic kidney disease, with long-term current use of insulin (HCC) Patient's last A1c was in 10/2021 which was 7.8.  Patient not currently on insulin products or other antihyperglycemic medications. - A1c pending - CBG checks, consider adding sliding scale insulin if needed    Other problems chronic and stable: HLD-continue statin Chronic back pain-continue baclofen, buprenorphine Anxiety-continue buspirone,  venlafaxine Allergic rhinitis-continue Flonase, loratadine Constipation-continue linaclotide GERD-continue pantoprazole COPD-continue LABA/LAMA/ICS formulary alternative with as needed albuterol  FEN/GI: Heart healthy VTE Prophylaxis: Lovenox  Disposition: Telemetry medical  History of Present Illness:  [name] is a 57 y.o. female presenting with SOB and fluid overload.  Patient reports her right knee gave out this morning causing her to fall. She was unable to get up on her own and called EMS to help her get up. Denies head injury, LOC. She reports her right knee has been giving out intermittently for the past few months. Denies any pain in her knee currently.  Patient reports she has had dyspnea off and on for the past few months. She reports she has had some increased swelling in her legs starting two days ago after eating shrimp lo mein. She takes metolazone Mondays and Thursdays. Denies missing any doses. No PND, orthopnea. Sleeps with one pillow.  In the ED, BNP, troponin, EKG, and chest x-ray was taken. Her chest x-ray did not show new pneumonia and did not show significant edema but there is concern of some peripheral edema and rales causing her hypoxia. She had an x-ray of the foot that did not show acute fracture, x-ray of the knee was taken which also did not reveal any fracture or dislocation.   Review Of Systems: Per HPI with the following additions: Generalized pain  Pertinent Past Medical History: HTN HLD Sleep apnea CHF CKD  Pertinent Past Surgical History: Back surgery Rotator cuff surgery Bariatric surgery Breast surgery  Pertinent Social History: Lives with a roommate in [place]. Smokes cigarettes 1 ppd since the age of 18. Denies alcohol,  recreational drug use. Did try crack cocaine last year for 4 days.  Pertinent Family History: Depression Diabetes Anxiety HTN  Remainder reviewed in history tab.   Important Outpatient  Medications: Diltiazem 120 mg twice daily Metolazone 2.5 mg 2 times a week on Mondays and Thursdays Remainder reviewed in medication history.   Objective: BP (!) 150/84 (BP Location: Left Arm)   Pulse 68   Temp 98.3 F (36.8 C) (Oral)   Resp 16   Ht 5\' 9"  (1.753 m)   Wt 127 kg   LMP 01/29/1992   SpO2 90%   BMI 41.35 kg/m  Exam: General: Middle-age, African-American female, NAD Cardiovascular: RRR, no M/R/G Respiratory: Normal effort, on 2L [place], diffuse crackles bilaterally Gastrointestinal: Obese abdomen, nontender MSK: Nontender right knee Extremities: Mild bilateral nonpitting edema  Labs:  CBC BMET  Recent Labs  Lab 06/09/22 1700  WBC 12.4*  HGB 17.5*  HCT 52.2*  PLT 364   Recent Labs  Lab 06/09/22 1700  NA 140  K 4.1  CL 99  CO2 29  BUN 23*  CREATININE 1.22*  GLUCOSE 87  CALCIUM 9.2     BNP 1336 Troponin 4  EKG: pending   Imaging Studies Performed:  Chest x-ray IMPRESSION: No active disease.    Lance Muss, MD 06/09/2022, 9:16 PM PGY-1, [facility]  FPTS Intern pager: [phone], text pages welcome Secure chat group [facility] Teaching Service   Upper Level Addendum:  I have reviewed the above note, making necessary revisions as appropriate.  I agree with the medical decision making and physical exam by the resident as noted above.  Littie Deeds, MD PGY-3 [facility]

## 2022-06-09 NOTE — Assessment & Plan Note (Deleted)
Patient present with SOB and was found to have O2 sat of 90% on 2L [place]. Patient is on metolazone at home.

## 2022-06-09 NOTE — ED Notes (Signed)
Patient returned from X-ray 

## 2022-06-09 NOTE — Hospital Course (Addendum)
[name] is a 57 y.o. female w/ hx of HFpEF, T2DM, COPD, GERD, HLD, anxiety, chronic back pain, allergic rhinitis admitted for CHF exacerbation.  CHF Exacerbation  HFpEF  Fall Pt presented for fall after R leg "gave out", came to ED and incidentally noted to be hypoxic. Pt was placed on 2L [place], then turned up to 4L. BNP elevated. Appeared fluid overloaded on exam. L foot and R knee XR showed no acute changes, but did show mild to moderate OA of R knee. CXR showed no acute changes.   Pt was treated with IV Lasix, had good UOP. Oxygen was weaned as tolerated until pt was satting well on RA. Repeat echo showed stable EF 60-65% (previously 55-60% in 2019). Transitioned from IV to PO lasix successfully with a net negative 10L since admission. Wt at discharge was 266.5 lbs (Dry weight).  Ambulated to monitor oxygen, determined pt will need 2L as new baseline. Given chronic polycythemia, pt likely had chronic hypoxia prior to admission.   PCP Follow-up 1) Consider starting GLP-1 given comorbid HFpEF,  T2DM, OSA and obesity.  2) Monitor BMP given Lasix and Jardiance started during this admission 3) Needs home oxygen machine for new O2 requirement. Was discharged on 2L [place] (previously on RA), can wean if tolerated.

## 2022-06-09 NOTE — Assessment & Plan Note (Addendum)
Weaned O2 to 2L (satting 88-90%, at goal). UOP 3.4L yesterday. Pt weight now ~10lb down from yesterday, likely a component of measurement error, but overall downtrend in wt since admission, good UOP w/ diuresis, and ability to wean O2 makes CHF exac the primary differential at this time. COPD exac is considered, although no wheezing on exam.  -Continue Lasix 40 IV twice daily - Will add 1.5L fluid restricition given continued weight gain - Wean O2 as tolerated - Cont home losartan - Plan to start SGLT2 prior to discharge - Strict I/O - Daily weight

## 2022-06-09 NOTE — Assessment & Plan Note (Addendum)
Patient's last A1c was in 10/2021 which was 7.8.  Patient not currently on insulin products or other antihyperglycemic medications. - A1c pending - CBG checks, consider adding sliding scale insulin if needed

## 2022-06-10 ENCOUNTER — Ambulatory Visit: Payer: 59 | Admitting: Student

## 2022-06-10 ENCOUNTER — Observation Stay (HOSPITAL_COMMUNITY): Payer: 59

## 2022-06-10 DIAGNOSIS — I5033 Acute on chronic diastolic (congestive) heart failure: Secondary | ICD-10-CM | POA: Diagnosis not present

## 2022-06-10 DIAGNOSIS — R296 Repeated falls: Secondary | ICD-10-CM | POA: Diagnosis present

## 2022-06-10 DIAGNOSIS — M1711 Unilateral primary osteoarthritis, right knee: Secondary | ICD-10-CM | POA: Diagnosis present

## 2022-06-10 DIAGNOSIS — D751 Secondary polycythemia: Secondary | ICD-10-CM | POA: Diagnosis present

## 2022-06-10 DIAGNOSIS — R0902 Hypoxemia: Secondary | ICD-10-CM | POA: Diagnosis present

## 2022-06-10 DIAGNOSIS — J309 Allergic rhinitis, unspecified: Secondary | ICD-10-CM | POA: Diagnosis present

## 2022-06-10 DIAGNOSIS — E1122 Type 2 diabetes mellitus with diabetic chronic kidney disease: Secondary | ICD-10-CM | POA: Diagnosis not present

## 2022-06-10 DIAGNOSIS — N179 Acute kidney failure, unspecified: Secondary | ICD-10-CM | POA: Diagnosis present

## 2022-06-10 DIAGNOSIS — M549 Dorsalgia, unspecified: Secondary | ICD-10-CM | POA: Diagnosis present

## 2022-06-10 DIAGNOSIS — K5900 Constipation, unspecified: Secondary | ICD-10-CM | POA: Diagnosis present

## 2022-06-10 DIAGNOSIS — E785 Hyperlipidemia, unspecified: Secondary | ICD-10-CM | POA: Diagnosis present

## 2022-06-10 DIAGNOSIS — Z79899 Other long term (current) drug therapy: Secondary | ICD-10-CM | POA: Diagnosis not present

## 2022-06-10 DIAGNOSIS — K219 Gastro-esophageal reflux disease without esophagitis: Secondary | ICD-10-CM | POA: Diagnosis present

## 2022-06-10 DIAGNOSIS — G4733 Obstructive sleep apnea (adult) (pediatric): Secondary | ICD-10-CM | POA: Diagnosis present

## 2022-06-10 DIAGNOSIS — J449 Chronic obstructive pulmonary disease, unspecified: Secondary | ICD-10-CM | POA: Diagnosis present

## 2022-06-10 DIAGNOSIS — N183 Chronic kidney disease, stage 3 unspecified: Secondary | ICD-10-CM | POA: Diagnosis present

## 2022-06-10 DIAGNOSIS — F32A Depression, unspecified: Secondary | ICD-10-CM | POA: Diagnosis present

## 2022-06-10 DIAGNOSIS — F17210 Nicotine dependence, cigarettes, uncomplicated: Secondary | ICD-10-CM | POA: Diagnosis present

## 2022-06-10 DIAGNOSIS — I130 Hypertensive heart and chronic kidney disease with heart failure and stage 1 through stage 4 chronic kidney disease, or unspecified chronic kidney disease: Secondary | ICD-10-CM | POA: Diagnosis present

## 2022-06-10 DIAGNOSIS — Z6841 Body Mass Index (BMI) 40.0 and over, adult: Secondary | ICD-10-CM | POA: Diagnosis not present

## 2022-06-10 DIAGNOSIS — N1831 Chronic kidney disease, stage 3a: Secondary | ICD-10-CM | POA: Diagnosis not present

## 2022-06-10 DIAGNOSIS — G8929 Other chronic pain: Secondary | ICD-10-CM | POA: Diagnosis present

## 2022-06-10 DIAGNOSIS — Z794 Long term (current) use of insulin: Secondary | ICD-10-CM | POA: Diagnosis not present

## 2022-06-10 DIAGNOSIS — F419 Anxiety disorder, unspecified: Secondary | ICD-10-CM | POA: Diagnosis present

## 2022-06-10 LAB — COMPREHENSIVE METABOLIC PANEL
ALT: 20 U/L (ref 0–44)
AST: 22 U/L (ref 15–41)
Albumin: 3 g/dL — ABNORMAL LOW (ref 3.5–5.0)
Alkaline Phosphatase: 87 U/L (ref 38–126)
Anion gap: 7 (ref 5–15)
BUN: 20 mg/dL (ref 6–20)
CO2: 29 mmol/L (ref 22–32)
Calcium: 8.6 mg/dL — ABNORMAL LOW (ref 8.9–10.3)
Chloride: 101 mmol/L (ref 98–111)
Creatinine, Ser: 1.12 mg/dL — ABNORMAL HIGH (ref 0.44–1.00)
GFR, Estimated: 57 mL/min — ABNORMAL LOW (ref >60)
Glucose, Bld: 104 mg/dL — ABNORMAL HIGH (ref 70–99)
Potassium: 3.7 mmol/L (ref 3.5–5.1)
Sodium: 137 mmol/L (ref 135–145)
Total Bilirubin: 0.9 mg/dL (ref 0.3–1.2)
Total Protein: 5.9 g/dL — ABNORMAL LOW (ref 6.5–8.1)

## 2022-06-10 LAB — HEMOGLOBIN A1C
Hgb A1c MFr Bld: 6.6 % — ABNORMAL HIGH (ref 4.8–5.6)
Mean Plasma Glucose: 142.72 mg/dL

## 2022-06-10 LAB — CBC
HCT: 49.3 % — ABNORMAL HIGH (ref 36.0–46.0)
Hemoglobin: 16.6 g/dL — ABNORMAL HIGH (ref 12.0–15.0)
MCH: 25.5 pg — ABNORMAL LOW (ref 26.0–34.0)
MCHC: 33.7 g/dL (ref 30.0–36.0)
MCV: 75.6 fL — ABNORMAL LOW (ref 80.0–100.0)
Platelets: 340 10*3/uL (ref 150–400)
RBC: 6.52 MIL/uL — ABNORMAL HIGH (ref 3.87–5.11)
RDW: 18.5 % — ABNORMAL HIGH (ref 11.5–15.5)
WBC: 11.7 10*3/uL — ABNORMAL HIGH (ref 4.0–10.5)
nRBC: 0.2 % (ref 0.0–0.2)

## 2022-06-10 LAB — ECHOCARDIOGRAM COMPLETE
Area-P 1/2: 4.24 cm2
Height: 69 in
S' Lateral: 2.7 cm
Weight: 4525.6 oz

## 2022-06-10 LAB — TROPONIN I (HIGH SENSITIVITY): Troponin I (High Sensitivity): <2 ng/L (ref <18)

## 2022-06-10 MED ORDER — PERFLUTREN LIPID MICROSPHERE
1.0000 mL | INTRAVENOUS | Status: AC | PRN
  Administered 2022-06-10: 2 mL via INTRAVENOUS

## 2022-06-10 MED ORDER — FUROSEMIDE 10 MG/ML IJ SOLN
40.0000 mg | Freq: Two times a day (BID) | INTRAMUSCULAR | Status: DC
  Administered 2022-06-10 – 2022-06-13 (×7): 40 mg via INTRAVENOUS
  Filled 2022-06-10 (×7): qty 4

## 2022-06-10 NOTE — Evaluation (Signed)
Physical Therapy Evaluation Patient Details Name: [name] MRN: [id number] DOB: 02/22/1965 Today's Date: 06/10/2022  History of Present Illness  Pt is a 57 y.o. female presenting with SOB and fluid overload. Pt also reports a fall the morning PTA due to her right knee giving out. PMH includes: anxiety, arthritis, bipolar disorder, CKD stage 3, COPD, depression, DM2, alcoholism, cervical fusion 2013, L shoulder surgery 2021, Laminectomies 2022.   Clinical Impression  Pt admitted with above. Pt was mod I PTA however has had numerous falls. Pt with a roommate however they are unable to provide any assist. Pt wasn't using supplemental O2 PTA but now requires 4Lo2 via [place]. Pt drops to 89% with mobility on 4Lo2 via [place] this date. Pt with generalized weakness, decreased activity tolerance, impaired balance, and noted DOE/SOB with all mobility. Discussed with patient that at this time she would benefit from rehab program < 3 hours a day to achieve safe mod I level of function for safe transition home.       Recommendations for follow up therapy are one component of a multi-disciplinary discharge planning process, led by the attending physician.  Recommendations may be updated based on patient status, additional functional criteria and insurance authorization.  Follow Up Recommendations Can patient physically be transported by private vehicle: No     Assistance Recommended at Discharge Frequent or constant Supervision/Assistance  Patient can return home with the following  A lot of help with walking and/or transfers;A lot of help with bathing/dressing/bathroom;Assist for transportation;Help with stairs or ramp for entrance    Equipment Recommendations Rolling walker (2 wheels)  Recommendations for Other Services       Functional Status Assessment Patient has had a recent decline in their functional status and demonstrates the ability to make significant improvements in function in a reasonable  and predictable amount of time.     Precautions / Restrictions Precautions Precautions: Fall Precaution Comments: obese, tremors Restrictions Weight Bearing Restrictions: No      Mobility  Bed Mobility Overal bed mobility: Needs Assistance Bed Mobility: Supine to Sit, Sit to Supine     Supine to sit: Min assist, HOB elevated Sit to supine: Min assist, HOB elevated   General bed mobility comments: HOB elevated, verbal cues to use bed rail, increased time, minA for LE management and safety, pt with noted jerky movements    Transfers Overall transfer level: Needs assistance Equipment used: Rolling walker (2 wheels) Transfers: Sit to/from Stand Sit to Stand: Mod assist           General transfer comment: modA to power up from elevated surface height, once standing pt minA    Ambulation/Gait               General Gait Details: pt with noted jerking movements in bilat UEs and bilat knees, limited to 5 side steps to [facility], pt able to stand x1 min  Stairs            Wheelchair Mobility    Modified Rankin (Stroke Patients Only)       Balance Overall balance assessment: Needs assistance Sitting-balance support: Feet supported, Single extremity supported Sitting balance-Leahy Scale: Fair Sitting balance - Comments: pt with posterior bias and noted jerking movement t/o bilat UEs   Standing balance support: Bilateral upper extremity supported Standing balance-Leahy Scale: Poor Standing balance comment: reliant on RW  Pertinent Vitals/Pain Pain Assessment Pain Assessment: Faces Faces Pain Scale: Hurts a little bit Pain Location: R LE Pain Descriptors / Indicators: Constant Pain Intervention(s): Monitored during session    Home Living Family/patient expects to be discharged to:: Private residence Living Arrangements: Non-relatives/Friends Available Help at Discharge: Friend(s);Available PRN/intermittently Type of  Home: House Home Access: Stairs to enter Entrance Stairs-Rails: Left Entrance Stairs-Number of Steps: 3   Home Layout: One level Home Equipment: Rollator (4 wheels);Cane - single point;Tub bench (reports her rollator is broken) Additional Comments: roommate works during the week    Prior Function Prior Level of Function : Independent/Modified Independent             Mobility Comments: amb with cane, doesn't drive, orders groceries ADLs Comments: indep with use of tub bench     Hand Dominance   Dominant Hand: Right    Extremity/Trunk Assessment   Upper Extremity Assessment Upper Extremity Assessment: Generalized weakness    Lower Extremity Assessment Lower Extremity Assessment: Generalized weakness    Cervical / Trunk Assessment Cervical / Trunk Assessment: Other exceptions Cervical / Trunk Exceptions: large body habitus  Communication   Communication: Expressive difficulties (lethargic)  Cognition Arousal/Alertness: Lethargic Behavior During Therapy: Flat affect Overall Cognitive Status: Impaired/Different from baseline                                 General Comments: pt lethargic and reports "I don't know why i'm so sleepy"  Pt oriented x4, able to follow commands but noted delay response time but also lethargicc        General Comments General comments (skin integrity, edema, etc.): SpO2 dec to 89% on 4LO2 via [place], HR in 110s    Exercises     Assessment/Plan    PT Assessment Patient needs continued PT services  PT Problem List Decreased strength;Decreased range of motion;Decreased activity tolerance;Decreased balance;Decreased mobility;Decreased knowledge of use of DME       PT Treatment Interventions DME instruction;Gait training;Stair training;Functional mobility training;Therapeutic activities;Therapeutic exercise;Balance training    PT Goals (Current goals can be found in the Care Plan section)  Acute Rehab PT Goals Patient Stated  Goal: home PT Goal Formulation: With patient Time For Goal Achievement: 06/24/22 Potential to Achieve Goals: Good    Frequency Min 1X/week     Co-evaluation               AM-PAC PT "6 Clicks" Mobility  Outcome Measure Help needed turning from your back to your side while in a flat bed without using bedrails?: A Lot Help needed moving from lying on your back to sitting on the side of a flat bed without using bedrails?: A Lot Help needed moving to and from a bed to a chair (including a wheelchair)?: A Lot Help needed standing up from a chair using your arms (e.g., wheelchair or bedside chair)?: A Lot Help needed to walk in hospital room?: Total Help needed climbing 3-5 steps with a railing? : Total 6 Click Score: 10    End of Session Equipment Utilized During Treatment: Gait belt;Oxygen (4LO2 via [place]) Activity Tolerance: Patient tolerated treatment well Patient left: in bed;with call bell/phone within reach;with bed alarm set Nurse Communication: Mobility status PT Visit Diagnosis: Unsteadiness on feet (R26.81);History of falling (Z91.81)    Time: 1205-1227 PT Time Calculation (min) (ACUTE ONLY): 22 min   Charges:   PT Evaluation $PT Eval Moderate Complexity: 1 Mod  Lewis Shock, PT, DPT Acute Rehabilitation Services Secure chat preferred Office #: [phone]   Iona Hansen 06/10/2022, 2:49 PM

## 2022-06-10 NOTE — Progress Notes (Signed)
Patient refusing cpap at this time

## 2022-06-10 NOTE — Plan of Care (Signed)

## 2022-06-10 NOTE — Evaluation (Signed)
Occupational Therapy Evaluation Patient Details Name: [name] MRN: [id number] DOB: 06/08/1965 Today's Date: 06/10/2022   History of Present Illness Pt is a 57 y.o. female presenting with SOB and fluid overload. Pt also reports a fall the morning PTA due to her right knee giving out. PMH includes: anxiety, arthritis, bipolar disorder, CKD stage 3, COPD, depression, DM2, alcoholism, cervical fusion 2013, L shoulder surgery 2021, Laminectomies 2022.   Clinical Impression   Pt admitted for above dx, PTA pt reports recently getting a roommate but they are unable to provide assistance, denies use of home oxygen and reports being Mod I in bADls/iADLs. Pt also reports a hx of falling up to 8 times in the past year following her R knee giving out. Pt currently presenting with impaired balance and activity tolerance, as well as pain in BUEs with full AROM. Pt would benefit from continued acute skilled OT services to address above deficits and help transition to next level of care. Patient would benefit from post acute skilled rehab facility with <3 hours of therapy and 24/7 support       Recommendations for follow up therapy are one component of a multi-disciplinary discharge planning process, led by the attending physician.  Recommendations may be updated based on patient status, additional functional criteria and insurance authorization.   Assistance Recommended at Discharge Frequent or constant Supervision/Assistance  Patient can return home with the following A little help with walking and/or transfers;A lot of help with bathing/dressing/bathroom;Assist for transportation;Assistance with cooking/housework;Help with stairs or ramp for entrance    Functional Status Assessment  Patient has had a recent decline in their functional status and demonstrates the ability to make significant improvements in function in a reasonable and predictable amount of time.  Equipment Recommendations  Other  (comment) (defer to next level of care)    Recommendations for Other Services       Precautions / Restrictions Precautions Precautions: Fall Precaution Comments: obese, tremors Restrictions Weight Bearing Restrictions: No      Mobility Bed Mobility Overal bed mobility: Needs Assistance       Supine to sit: Min guard, HOB elevated Sit to supine: Min guard, HOB elevated   General bed mobility comments: Pt using bed rails to assist with bed mobility. Jerky movements in RUE once sitting EOB    Transfers Overall transfer level: Needs assistance Equipment used: Rolling walker (2 wheels) Transfers: Sit to/from Stand Sit to Stand: Mod assist           General transfer comment: cues for hand placement and to push off bed      Balance Overall balance assessment: Needs assistance Sitting-balance support: Single extremity supported, Feet supported Sitting balance-Leahy Scale: Fair Sitting balance - Comments: Pt sitting occasionally with RUE supported on rails and occassionally with RUE rested   Standing balance support: Bilateral upper extremity supported, Reliant on assistive device for balance, During functional activity Standing balance-Leahy Scale: Poor Standing balance comment: Reliant on RW                           ADL either performed or assessed with clinical judgement   ADL Overall ADL's : Needs assistance/impaired Eating/Feeding: Independent;Sitting   Grooming: Min guard;Sitting   Upper Body Bathing: Sitting;Min guard;Set up   Lower Body Bathing: Sitting/lateral leans;Min guard   Upper Body Dressing : Sitting;Supervision/safety;Set up   Lower Body Dressing: Sitting/lateral leans;Maximal assistance   Toilet Transfer: Minimal assistance;Ambulation   Toileting- Clothing  Manipulation and Hygiene: Moderate assistance;Sit to/from stand   Tub/ Shower Transfer: Ambulation;Minimal assistance;+2 for safety/equipment   Functional mobility during  ADLs: Minimal assistance;Rolling walker (2 wheels) (steps at bedside)       Vision         Perception     Praxis      Pertinent Vitals/Pain Pain Assessment Pain Assessment: Faces Faces Pain Scale: Hurts whole lot Pain Location: BUEs Pain Descriptors / Indicators: Constant, Aching, Grimacing, Discomfort, Guarding Pain Intervention(s): Limited activity within patient's tolerance, Monitored during session     Hand Dominance Right   Extremity/Trunk Assessment Upper Extremity Assessment Upper Extremity Assessment: Generalized weakness   Lower Extremity Assessment Lower Extremity Assessment: Generalized weakness   Cervical / Trunk Assessment Cervical / Trunk Assessment: Other exceptions Cervical / Trunk Exceptions: large body habitus   Communication Communication Communication: No difficulties   Cognition Arousal/Alertness: Awake/alert Behavior During Therapy: WFL for tasks assessed/performed Overall Cognitive Status: Within Functional Limits for tasks assessed                                       General Comments  Pt c/o lightheadedness when positioned EOB, BP 159/107 after attempting stand BP 153/87. Deferred further OOB mobility due to BP and Sp02 desat to 87% on 4L following functional mobility. Sp02 returned to 93% on 4L when pt returned to sitting EOB    Exercises     Shoulder Instructions      Home Living Family/patient expects to be discharged to:: Private residence Living Arrangements: Non-relatives/Friends (just got a roomate) Available Help at Discharge:  (Pt trying to get a PCA) Type of Home: House Home Access: Stairs to enter [employer] of Steps: 3 steps onto porch Entrance Stairs-Rails: Left Home Layout: One level     Bathroom Shower/Tub: Chief Strategy Officer: Handicapped height Bathroom Accessibility: Yes How Accessible: Accessible via walker Home Equipment: Tub bench;Grab bars -  tub/shower;BSC/3in1;Grab bars - toilet;Adaptive equipment Adaptive Equipment: Reacher Additional Comments: Pt reports two falls recently PTA due R knee giving out. Pt reports 8 falls in the past year      Prior Functioning/Environment Prior Level of Function : Needs assist;Driving;History of Falls (last six months)             Mobility Comments: ambulating in home using walls after Rollator broke ADLs Comments: Mod I in bADLs. Pt reprots she uses to floor to help don underwear        OT Problem List: Impaired balance (sitting and/or standing);Decreased activity tolerance;Decreased safety awareness;Decreased strength;Cardiopulmonary status limiting activity      OT Treatment/Interventions: Energy conservation;DME and/or AE instruction;Patient/family education;Balance training;Self-care/ADL training;Therapeutic exercise;Therapeutic activities    OT Goals(Current goals can be found in the care plan section) Acute Rehab OT Goals Patient Stated Goal: To get my health together; energy back OT Goal Formulation: With patient Time For Goal Achievement: 06/24/22 Potential to Achieve Goals: Good ADL Goals Pt Will Perform Lower Body Dressing: with adaptive equipment;with min guard assist;sitting/lateral leans Pt Will Transfer to Toilet: with min guard assist;ambulating;bedside commode Additional ADL Goal #1: Pt will demonstrated use of energy conservation techniques with functional mobility prn with min cueing. Additional ADL Goal #2: Pt wiil demonstrate compliance with AROM exercise program for BUEs  OT Frequency: Min 2X/week    Co-evaluation              AM-PAC OT "6 Clicks"  Daily Activity     Outcome Measure Help from another person eating meals?: None Help from another person taking care of personal grooming?: A Little Help from another person toileting, which includes using toliet, bedpan, or urinal?: A Lot Help from another person bathing (including washing, rinsing,  drying)?: A Lot Help from another person to put on and taking off regular upper body clothing?: A Little Help from another person to put on and taking off regular lower body clothing?: A Lot 6 Click Score: 16   End of Session Equipment Utilized During Treatment: Gait belt;Oxygen;Rolling walker (2 wheels) Nurse Communication: Mobility status  Activity Tolerance: Patient tolerated treatment well Patient left: in bed;with call bell/phone within reach;with bed alarm set  OT Visit Diagnosis: Unsteadiness on feet (R26.81);Repeated falls (R29.6);History of falling (Z91.81)                Time: 1610-9604 OT Time Calculation (min): 21 min Charges:  OT General Charges $OT Visit: 1 Visit OT Evaluation $OT Eval Moderate Complexity: 1 Mod  06/10/2022  AB, OTR/L  Acute Rehabilitation Services  Office: [phone]   Tristan Schroeder 06/10/2022, 2:45 PM

## 2022-06-10 NOTE — Progress Notes (Signed)
OT Cancellation Note  Patient Details Name: [name] MRN: [id number] DOB: 21-Aug-1965   Cancelled Treatment:    Reason Eval/Treat Not Completed: Fatigue/lethargy limiting ability to participate (Discussed with NT, Pt is consistently in/out of sleep and not fully capable of participating at this time. OT to f/u with patient as able.)  06/10/2022  AB, OTR/L  Acute Rehabilitation Services  Office: [phone]  Tristan Schroeder 06/10/2022, 10:06 AM

## 2022-06-10 NOTE — Progress Notes (Signed)
Daily Progress Note Intern Pager: [phone]  Patient name: [name] Medical record number: [id number] Date of birth: 1965-10-31 Age: 57 y.o. Gender: female  Primary Care Provider: Jerre Simon, MD Consultants: None  Code Status: Full  Pt Overview and Major Events to Date:  5/12 Admitted  Assessment and Plan: [name] is a 57 y.o. female admitted for AHRF in setting of CHF exacerbation.   Pertinent PMH/PSH includes HFpEF, T2DM, COPD, GERD, HLD, anxiety, chronic back pain, allergic rhinitis.   * Acute on chronic heart failure with preserved ejection fraction (HFpEF) (HCC) Was able to wean to 2L [place]. Volume up on exam (1+ pitting edema). Had good UOP(1L) on 40 IV Lasix, will redose BID today. Will hold home metolazone.  If able to wean to room air, will likely transition to Lasix p.o. To simplify outpatient medication regimen, consider scheduled Lasix and holding metolazone.  Echo showed EF 60-65% (previously 55-60% in 2019), overall stable.  - Redose Lasix 40 IV BID today. Redose tomorrow AM depending on response.  - Wean O2 as tolerated. - Strict I/O - Daily weights - Vital signs - AM BMP  Essential hypertension Blood pressure remains stably elevated.  Will hold off on adjusting other BP meds while actively diuresing.  If blood pressure remains high, consider uptitrating losartan. -Continue home losartan 25 mg -Continue diltiazem 120 mg twice daily  Type 2 diabetes mellitus with stage 3 chronic kidney disease, with long-term current use of insulin (HCC) A1c 6.6 without meds, diabetes is diet controlled. Will d/c CBGs.  - Given HFpEF, plan to start SGLT2 prior to discharge.   FEN/GI: Heart healthy PPx: Lovenox Dispo:Pending PT recommendations . Requires hospitalization for IV diuresis and close monitoring of I's and O's.   Subjective:  Reports breathing is improving from admission.  Reports that she used to take Lasix twice daily, but switch to as needed  after gastric sleeve and 100 pound weight loss.  Objective: Temp:  [98.1 F (36.7 C)-98.4 F (36.9 C)] 98.2 F (36.8 C) (05/13 1000) Pulse Rate:  [68-90] 80 (05/13 1000) Resp:  [16-20] 19 (05/13 1000) BP: (142-183)/(79-99) 172/99 (05/13 1000) SpO2:  [89 %-95 %] 90 % (05/13 1000) FiO2 (%):  [36 %] 36 % (05/13 0916) Weight:  [127 kg-128.3 kg] 128.3 kg (05/13 0434) Physical Exam: General: Alert woman laying in bed. NAD.  HEENT: NCAT. MMM.  Cardiovascular: RRR, no murmurs. Respiratory: CTAB. Normal WOB on 2L .  Abdomen: Soft, nontender, nondistended. Normal BS.  Extremities: 1+ pitting edema BL.   Laboratory: Most recent CBC Lab Results  Component Value Date   WBC 11.7 (H) 06/10/2022   HGB 16.6 (H) 06/10/2022   HCT 49.3 (H) 06/10/2022   MCV 75.6 (L) 06/10/2022   PLT 340 06/10/2022   Most recent BMP    Latest Ref Rng & Units 06/10/2022    1:12 AM  BMP  Glucose 70 - 99 mg/dL 147   BUN 6 - 20 mg/dL 20   Creatinine 8.29 - 1.00 mg/dL 5.62   Sodium 130 - 865 mmol/L 137   Potassium 3.5 - 5.1 mmol/L 3.7   Chloride 98 - 111 mmol/L 101   CO2 22 - 32 mmol/L 29   Calcium 8.9 - 10.3 mg/dL 8.6    Imaging/Diagnostic Tests:  Echo  1. Extremely limited; LV function appears to be normal on definity  images; color doppler suboptimal.   2. Left ventricular ejection fraction, by estimation, is 60 to 65%. The  left ventricle has normal function. The left ventricle has no regional  wall motion abnormalities. Left ventricular diastolic parameters are  indeterminate.   3. Right ventricular systolic function was not well visualized. The right  ventricular size is not well visualized.   4. The mitral valve was not well visualized. not well visualized mitral  valve regurgitation.   5. Tricuspid valve regurgitation not well visualized.   6. The aortic valve was not well visualized. Aortic valve regurgitation  not well visualized.   7. Pulmonic valve regurgitation not well visualized.     Mary Brigham, MD 06/10/2022, 2:37 PM  PGY-1, [facility] FPTS Intern pager: [phone], text pages welcome Secure chat group [facility] Teaching Service

## 2022-06-11 ENCOUNTER — Other Ambulatory Visit: Payer: Self-pay | Admitting: Student

## 2022-06-11 ENCOUNTER — Ambulatory Visit: Payer: 59 | Admitting: Orthopaedic Surgery

## 2022-06-11 DIAGNOSIS — I5033 Acute on chronic diastolic (congestive) heart failure: Secondary | ICD-10-CM | POA: Diagnosis not present

## 2022-06-11 LAB — BASIC METABOLIC PANEL
Anion gap: 11 (ref 5–15)
BUN: 19 mg/dL (ref 6–20)
CO2: 30 mmol/L (ref 22–32)
Calcium: 8.7 mg/dL — ABNORMAL LOW (ref 8.9–10.3)
Chloride: 101 mmol/L (ref 98–111)
Creatinine, Ser: 1.08 mg/dL — ABNORMAL HIGH (ref 0.44–1.00)
GFR, Estimated: 60 mL/min — ABNORMAL LOW (ref >60)
Glucose, Bld: 102 mg/dL — ABNORMAL HIGH (ref 70–99)
Potassium: 3.4 mmol/L — ABNORMAL LOW (ref 3.5–5.1)
Sodium: 142 mmol/L (ref 135–145)

## 2022-06-11 LAB — CBC
HCT: 50.7 % — ABNORMAL HIGH (ref 36.0–46.0)
Hemoglobin: 16.9 g/dL — ABNORMAL HIGH (ref 12.0–15.0)
MCH: 24.7 pg — ABNORMAL LOW (ref 26.0–34.0)
MCHC: 33.3 g/dL (ref 30.0–36.0)
MCV: 74 fL — ABNORMAL LOW (ref 80.0–100.0)
Platelets: 320 10*3/uL (ref 150–400)
RBC: 6.85 MIL/uL — ABNORMAL HIGH (ref 3.87–5.11)
RDW: 18.6 % — ABNORMAL HIGH (ref 11.5–15.5)
WBC: 10.6 10*3/uL — ABNORMAL HIGH (ref 4.0–10.5)
nRBC: 0 % (ref 0.0–0.2)

## 2022-06-11 MED ORDER — POTASSIUM CHLORIDE CRYS ER 20 MEQ PO TBCR
40.0000 meq | EXTENDED_RELEASE_TABLET | Freq: Once | ORAL | Status: AC
  Administered 2022-06-11: 40 meq via ORAL
  Filled 2022-06-11: qty 2

## 2022-06-11 NOTE — TOC Initial Note (Signed)
Transition of Care [facility]) - Initial/Assessment Note    Patient Details  Name: [name] MRN: [id number] Date of Birth: 1965/03/07  Transition of Care [facility]) CM/SW Contact:    Leander Rams, LCSW Phone Number: 06/11/2022, 11:06 AM  Clinical Narrative:                 CSW met with pt at bedside to discuss dc to STR at [facility]. Pt expressed she has never gone before but is agreeable to go. Pt stated she uses a cane and a rollater, but the rollator is broken and a CM in the ED stated she would work on getting a new one for pt.   Pt requested [facility] or [place] and prefers to remain in [place]. CSW will complete fl2 and fax out. TOC will continue to follow.     Expected Discharge Plan: Skilled Nursing Facility Barriers to Discharge: Continued Medical Work up   Patient Goals and CMS Choice Patient states their goals for this hospitalization and ongoing recovery are:: Pt expresses shes okay with going to a rehab facility before going back home.          Expected Discharge Plan and Services       Living arrangements for the past 2 months: Single Family Home                                      Prior Living Arrangements/Services Living arrangements for the past 2 months: Single Family Home Lives with:: Self, Roommate Patient language and need for interpreter reviewed:: Yes Do you feel safe going back to the place where you live?: Yes      Need for Family Participation in Patient Care: Yes (Comment) Care giver support system in place?: Yes (comment) Current home services: DME Criminal Activity/Legal Involvement Pertinent to Current Situation/Hospitalization: No - Comment as needed  Activities of Daily Living Home Assistive Devices/Equipment: Walker (specify type), Cane (specify quad or straight) (rollator broken, has both 2 wheel walker and rollator, straight canes) ADL Screening (condition at time of admission) Patient's cognitive ability adequate to safely complete  daily activities?: Yes Is the patient deaf or have difficulty hearing?: No Does the patient have difficulty seeing, even when wearing glasses/contacts?: No Does the patient have difficulty concentrating, remembering, or making decisions?: No Patient able to express need for assistance with ADLs?: Yes Does the patient have difficulty dressing or bathing?: No Independently performs ADLs?: Yes (appropriate for developmental age) Does the patient have difficulty walking or climbing stairs?: Yes Weakness of Legs: Both Weakness of Arms/Hands: Both  Permission Sought/Granted Permission sought to share information with : Family Supports    Share Information with NAME: Dondra Prader     Permission granted to share info w Relationship: Son  Permission granted to share info w Contact Information: [phone]  Emotional Assessment Appearance:: Developmentally appropriate Attitude/Demeanor/Rapport: Engaged Affect (typically observed): Accepting Orientation: : Oriented to Self, Oriented to Place, Oriented to  Time, Oriented to Situation Alcohol / Substance Use: Not Applicable Psych Involvement: No (comment)  Admission diagnosis:  Hypoxia [R09.02] Acute exacerbation of CHF (congestive heart failure) (HCC) [I50.9] Hypervolemia, unspecified hypervolemia type [E87.70] Acute on chronic heart failure with preserved ejection fraction (HFpEF) (HCC) [I50.33] Patient Active Problem List   Diagnosis Date Noted   Acute on chronic heart failure with preserved ejection fraction (HFpEF) (HCC) 06/09/2022   Acute exacerbation of CHF (congestive heart failure) (HCC) 06/09/2022  Panniculitis 12/04/2021   Infection of lip 08/27/2021   Lumbar stenosis with neurogenic claudication 06/29/2021   Lumbar radiculopathy 06/29/2021   Biceps tendinopathy of right upper extremity 05/03/2021   Irregular heartbeat 04/11/2021   Neuropathic pain of foot 03/28/2021   Chronic congestion of paranasal sinus 03/28/2021    Impingement syndrome of right shoulder 03/08/2021   Arthritis of right acromioclavicular joint 03/08/2021   Acute medial meniscus tear of right knee 03/08/2021   Acute lateral meniscus tear of right knee 03/08/2021   Spinal stenosis of thoracic region with radiculopathy    Bilateral leg numbness    Spinal stenosis of lumbar region with neurogenic claudication    Primary osteoarthritis of right knee    Impaired ambulation 01/10/2021   Status post lumbar laminectomy 10/03/2020   Cubital tunnel syndrome on left 05/02/2020   Vaginal discharge 01/20/2020   Prolapse of anterior vaginal wall 11/10/2019   Skin ulcer of left thigh, limited to breakdown of skin (HCC) 09/09/2019   Ulnar nerve compression 08/06/2019   Diabetic neuropathy associated with type 2 diabetes mellitus (HCC) 08/06/2019   Body mass index 50.0-59.9, adult (HCC) 06/12/2019   Impingement syndrome of left shoulder 05/07/2019   Polyp of colon    Polyp of ascending colon    Rectal discomfort 02/12/2019   Osteoarthritis of right hip 02/12/2019   Nontraumatic tear of left supraspinatus tendon 12/30/2018   Gout 12/17/2018   Nontraumatic complete tear of right rotator cuff 12/08/2018   Rotator cuff tear, right 12/01/2018   Tobacco abuse 08/21/2018   Right groin pain 07/20/2018   Depressed mood 07/13/2018   Paroxysmal SVT (supraventricular tachycardia) 03/20/2018   Pure hypercholesterolemia 03/20/2018   Muscle cramping 02/25/2018   Nodule of upper lobe of right lung 02/13/2018   Falls, subsequent encounter 01/30/2018   Pain in left foot 11/04/2017   Essential hypertension 10/27/2017   Solitary pulmonary nodule 10/07/2017   Restrictive lung disease secondary to obesity 10/01/2017   Polycythemia, secondary 10/01/2017   Chronic viral hepatitis B without delta-agent (HCC) 07/08/2017   COPD (chronic obstructive pulmonary disease) with chronic bronchitis 06/27/2017   Chronic kidney disease (CKD), stage IV (severe) (HCC)  01/14/2017   Decubitus ulcer 12/13/2016   Type 2 diabetes mellitus with stage 3 chronic kidney disease, with long-term current use of insulin (HCC) 12/12/2016   Urge incontinence of urine 12/05/2016   Trigger finger, left index finger 07/15/2016   Back pain 04/07/2014   Morbid obesity (HCC) 10/02/2012   Chronic diastolic CHF (congestive heart failure) (HCC) 04/07/2012   Skin lesion of scalp 03/15/2011   GERD 01/26/2010   Obstructive sleep apnea treated with BiPAP 02/03/2008   FIBROCYSTIC BREAST DISEASE 10/28/2006   Hyperlipidemia 03/27/2006   Obesity hypoventilation syndrome (HCC) 03/27/2006   Depression with anxiety 03/27/2006   PCP:  Jerre Simon, MD Pharmacy:   [facility] - [place], [place] - [address] [place] [address] Phone: [phone] Fax: [phone]  [id number] - [place], [place] - [address] AT [facility] [address] [place] [address] Phone: [phone] Fax: [phone]     Social Determinants of Health (SDOH) Social History: SDOH Screenings   Food Insecurity: No Food Insecurity (06/09/2022)  Housing: Low Risk  (06/09/2022)  Transportation Needs: Unmet Transportation Needs (06/09/2022)  Utilities: Not At Risk (06/09/2022)  Alcohol Screen: Low Risk  (04/02/2022)  Depression (PHQ2-9): Low Risk  (04/02/2022)  Financial Resource Strain: Low Risk  (06/06/2022)  Physical Activity: Inactive (06/06/2022)  Social Connections: Socially Isolated (06/06/2022)  Stress: No Stress Concern Present (06/06/2022)  Tobacco Use: High Risk (06/09/2022)   SDOH Interventions: Transportation Interventions: Inpatient TOC   Readmission Risk Interventions     No data to display          Oletta Lamas, MSW, LCSWA, LCASA Transitions of Care  Clinical Social Worker I

## 2022-06-11 NOTE — [place] FL2 (Addendum)
MEDICAID FL2 LEVEL OF CARE FORM     IDENTIFICATION  Patient Name: [name] Birthdate: [birth year] Sex: female Admission Date (Current Location): 06/09/2022  [facility] and [place] Number:  Producer, television/film/video and Address:  [facility], [address] [place], [place] [address]      Provider Number: [id number]  Attending Physician Name and Address:  Carney Living, MD  Relative Name and Phone Number:       Current Level of Care: Hospital Recommended Level of Care: Skilled Nursing Facility Prior Approval Number:    Date Approved/Denied:   PASRR Number: [id number]  Discharge Plan: SNF    Current Diagnoses: Patient Active Problem List   Diagnosis Date Noted   Acute on chronic heart failure with preserved ejection fraction (HFpEF) (HCC) 06/09/2022   Acute exacerbation of CHF (congestive heart failure) (HCC) 06/09/2022   Panniculitis 12/04/2021   Infection of lip 08/27/2021   Lumbar stenosis with neurogenic claudication 06/29/2021   Lumbar radiculopathy 06/29/2021   Biceps tendinopathy of right upper extremity 05/03/2021   Irregular heartbeat 04/11/2021   Neuropathic pain of foot 03/28/2021   Chronic congestion of paranasal sinus 03/28/2021   Impingement syndrome of right shoulder 03/08/2021   Arthritis of right acromioclavicular joint 03/08/2021   Acute medial meniscus tear of right knee 03/08/2021   Acute lateral meniscus tear of right knee 03/08/2021   Spinal stenosis of thoracic region with radiculopathy    Bilateral leg numbness    Spinal stenosis of lumbar region with neurogenic claudication    Primary osteoarthritis of right knee    Impaired ambulation 01/10/2021   Status post lumbar laminectomy 10/03/2020   Cubital tunnel syndrome on left 05/02/2020   Vaginal discharge 01/20/2020   Prolapse of anterior vaginal wall 11/10/2019   Skin ulcer of left thigh, limited to breakdown of skin (HCC) 09/09/2019   Ulnar nerve  compression 08/06/2019   Diabetic neuropathy associated with type 2 diabetes mellitus (HCC) 08/06/2019   Body mass index 50.0-59.9, adult (HCC) 06/12/2019   Impingement syndrome of left shoulder 05/07/2019   Polyp of colon    Polyp of ascending colon    Rectal discomfort 02/12/2019   Osteoarthritis of right hip 02/12/2019   Nontraumatic tear of left supraspinatus tendon 12/30/2018   Gout 12/17/2018   Nontraumatic complete tear of right rotator cuff 12/08/2018   Rotator cuff tear, right 12/01/2018   Tobacco abuse 08/21/2018   Right groin pain 07/20/2018   Depressed mood 07/13/2018   Paroxysmal SVT (supraventricular tachycardia) 03/20/2018   Pure hypercholesterolemia 03/20/2018   Muscle cramping 02/25/2018   Nodule of upper lobe of right lung 02/13/2018   Falls, subsequent encounter 01/30/2018   Pain in left foot 11/04/2017   Essential hypertension 10/27/2017   Solitary pulmonary nodule 10/07/2017   Restrictive lung disease secondary to obesity 10/01/2017   Polycythemia, secondary 10/01/2017   Chronic viral hepatitis B without delta-agent (HCC) 07/08/2017   COPD (chronic obstructive pulmonary disease) with chronic bronchitis 06/27/2017   Chronic kidney disease (CKD), stage IV (severe) (HCC) 01/14/2017   Decubitus ulcer 12/13/2016   Type 2 diabetes mellitus with stage 3 chronic kidney disease, with long-term current use of insulin (HCC) 12/12/2016   Urge incontinence of urine 12/05/2016   Trigger finger, left index finger 07/15/2016   Back pain 04/07/2014   Morbid obesity (HCC) 10/02/2012   Chronic diastolic CHF (congestive heart failure) (HCC) 04/07/2012   Skin lesion of scalp 03/15/2011   GERD 01/26/2010  Obstructive sleep apnea treated with BiPAP 02/03/2008   FIBROCYSTIC BREAST DISEASE 10/28/2006   Hyperlipidemia 03/27/2006   Obesity hypoventilation syndrome (HCC) 03/27/2006   Depression with anxiety 03/27/2006    Orientation RESPIRATION BLADDER Height & Weight      Self, Time, Situation, Place  O2 (3 liters) Incontinent, External catheter Weight: 284 lb 6.3 oz (129 kg) Height:  5\' 9"  (175.3 cm)  BEHAVIORAL SYMPTOMS/MOOD NEUROLOGICAL BOWEL NUTRITION STATUS      Continent Diet (See dc summary)  AMBULATORY STATUS COMMUNICATION OF NEEDS Skin     Verbally Normal                       Personal Care Assistance Level of Assistance   Bathing, Feeding, Dressing     Bathing Assistance: Maximum assistance Feeding assistance: Independent Dressing Assistance: Maximum assistance        Functional Limitations Info  Sight, Hearing, Speech Sight Info: Adequate Hearing Info: Adequate Speech Info: Adequate    SPECIAL CARE FACTORS FREQUENCY  PT (By licensed PT), OT (By licensed OT)     PT Frequency: 5xweek OT Frequency: 5xweek            Contractures Contractures Info: Not present    Additional Factors Info  Code Status, Allergies Code Status Info: full Allergies Info: Aspirin  Methadone Hcl  Levofloxacin           Current Medications (06/11/2022):  This is the current hospital active medication list Current Facility-Administered Medications  Medication Dose Route Frequency Provider Last Rate Last Admin   acetaminophen (TYLENOL) tablet 650 mg  650 mg Oral Q6H PRN Kizzie Ide B, MD   650 mg at 06/10/22 0930   Or   acetaminophen (TYLENOL) suppository 650 mg  650 mg Rectal Q6H PRN Kizzie Ide B, MD       albuterol (PROVENTIL) (2.5 MG/3ML) 0.083% nebulizer solution 3 mL  3 mL Inhalation Q6H PRN Kizzie Ide B, MD       atorvastatin (LIPITOR) tablet 40 mg  40 mg Oral Daily Kizzie Ide B, MD   40 mg at 06/11/22 0801   baclofen (LIORESAL) tablet 10 mg  10 mg Oral TID Kizzie Ide B, MD   10 mg at 06/11/22 0801   Buprenorphine HCl FILM 1 Film  1 Film Buccal BID Kizzie Ide B, MD   1 Film at 06/11/22 0854   busPIRone (BUSPAR) tablet 10 mg  10 mg Oral TID Kizzie Ide B, MD   10 mg at 06/11/22 0802   calcium carbonate (OS-CAL -  dosed in mg of elemental calcium) tablet 1,250 mg  1 tablet Oral BID WC Kizzie Ide B, MD   1,250 mg at 06/11/22 0802   diltiazem (CARDIZEM CD) 24 hr capsule 120 mg  120 mg Oral BID Kizzie Ide B, MD   120 mg at 06/11/22 0801   enoxaparin (LOVENOX) injection 60 mg  60 mg Subcutaneous Q24H Chambliss, Marshall L, MD   60 mg at 06/10/22 2200   fluticasone (FLONASE) 50 MCG/ACT nasal spray 2 spray  2 spray Each Nare BID Kizzie Ide B, MD   2 spray at 06/11/22 0812   fluticasone furoate-vilanterol (BREO ELLIPTA) 100-25 MCG/ACT 1 puff  1 puff Inhalation Daily Kizzie Ide B, MD   1 puff at 06/11/22 1610   And   umeclidinium bromide (INCRUSE ELLIPTA) 62.5 MCG/ACT 1 puff  1 puff Inhalation Daily Kizzie Ide B, MD   1 puff at 06/11/22 0812   furosemide (  LASIX) injection 40 mg  40 mg Intravenous BID Tiffany Kocher, DO   40 mg at 06/11/22 3664   gabapentin (NEURONTIN) capsule 600 mg  600 mg Oral BID Kizzie Ide B, MD   600 mg at 06/11/22 0801   gabapentin (NEURONTIN) capsule 900 mg  900 mg Oral QHS Carney Living, MD   900 mg at 06/10/22 2211   linaclotide (LINZESS) capsule 72 mcg  72 mcg Oral QAC breakfast Kizzie Ide B, MD   72 mcg at 06/11/22 0802   loratadine (CLARITIN) tablet 10 mg  10 mg Oral Daily Kizzie Ide B, MD   10 mg at 06/11/22 0802   losartan (COZAAR) tablet 25 mg  25 mg Oral QHS Kizzie Ide B, MD   25 mg at 06/10/22 2211   montelukast (SINGULAIR) tablet 10 mg  10 mg Oral Daily Kizzie Ide B, MD   10 mg at 06/11/22 0801   multivitamins with iron tablet 1 tablet  1 tablet Oral BID Kizzie Ide B, MD   1 tablet at 06/11/22 0801   nicotine (NICODERM CQ - dosed in mg/24 hours) patch 21 mg  21 mg Transdermal Daily Kizzie Ide B, MD       pantoprazole (PROTONIX) EC tablet 40 mg  40 mg Oral Daily Kizzie Ide B, MD   40 mg at 06/11/22 0802   venlafaxine XR (EFFEXOR-XR) 24 hr capsule 75 mg  75 mg Oral Q breakfast Kizzie Ide B, MD   75 mg at 06/11/22 0802      Discharge Medications: Please see discharge summary for a list of discharge medications.  Relevant Imaging Results:  Relevant Lab Results:   Additional Information SSN: [id number]  Oletta Lamas, MSW, [facility] Transitions of Care  Clinical Social Worker I

## 2022-06-11 NOTE — Progress Notes (Signed)
     Daily Progress Note Intern Pager: [phone]  Patient name: [name] Medical record number: [id number] Date of birth: May 21, 1965 Age: 57 y.o. Gender: female  Primary Care Provider: Jerre Simon, MD Consultants: None Code Status: Full  Pt Overview and Major Events to Date:  5/12 Admitted  Assessment and Plan: [name] is a 57 y.o. female admitted for AHRF in setting of CHF exacerbation.    Pertinent PMH/PSH includes HFpEF, T2DM, COPD, GERD, HLD, anxiety, chronic back pain, allergic rhinitis.   * Acute on chronic heart failure with preserved ejection fraction (HFpEF) (HCC) O2 requirement increased to 4 L yesterday, was able to titrate to 3L this AM. UOP 2.7L yesterday, adequate. Pt weight is uptrending since admission (wt up >4lbs over past 2 days) despite good UOP from diuresis. Given that pt still requires O2, will continue IV diuresis.  - Redose Lasix 40 IV twice daily today - Will add 1.5L fluid restricition given continued weight gain - Wean O2 as tolerated - Cont home losartan - Plan to start SGLT2 prior to discharge - Strict I/O - Daily weight  Essential hypertension Blood pressure steadily downtrending with diuresis. -Continue home losartan 25 mg -Continue diltiazem 120 mg twice daily  Type 2 diabetes mellitus with stage 3 chronic kidney disease, with long-term current use of insulin (HCC) Diet-controlled - Given HFpEF, plan to start SGLT2 prior to discharge.   FEN/GI: Heart healthy, 1.5L fluid restricition PPx: Lovenox Dispo:SNF in 2-3 days. Barriers include continued O2 requirement and need for IV medications.   Subjective:  Reports breathing is getting better. Peeing a lot.   Objective: Temp:  [98 F (36.7 C)-98.4 F (36.9 C)] 98.4 F (36.9 C) (05/14 1045) Pulse Rate:  [74-91] 76 (05/14 1045) Resp:  [17-20] 20 (05/14 1045) BP: (109-151)/(75-97) 135/92 (05/14 1045) SpO2:  [90 %-96 %] 92 % (05/14 1045) Weight:  [129 kg] 129 kg (05/14  0500) Physical Exam: General: Alert, laying in bed comfortably.  Speaking in full sentences.  NAD. HEENT: NCAT.  MMM Cardiovascular: RRR, no murmurs Respiratory: Clear to auscultation bilaterally.  Desat to 86% on 2 L [place], improved to >88% on 3L.  Abdomen: Soft, nontender, nondistended. Extremities: 1+ pitting edema bilaterally  Laboratory: Most recent CBC Lab Results  Component Value Date   WBC 10.6 (H) 06/11/2022   HGB 16.9 (H) 06/11/2022   HCT 50.7 (H) 06/11/2022   MCV 74.0 (L) 06/11/2022   PLT 320 06/11/2022   Most recent BMP    Latest Ref Rng & Units 06/11/2022    1:14 AM  BMP  Glucose 70 - 99 mg/dL 401   BUN 6 - 20 mg/dL 19   Creatinine 0.27 - 1.00 mg/dL 2.53   Sodium 664 - 403 mmol/L 142   Potassium 3.5 - 5.1 mmol/L 3.4   Chloride 98 - 111 mmol/L 101   CO2 22 - 32 mmol/L 30   Calcium 8.9 - 10.3 mg/dL 8.7     Mary Brigham, MD 06/11/2022, 12:48 PM  PGY-1, [facility] FPTS Intern pager: [phone], text pages welcome Secure chat group [facility] Teaching Service

## 2022-06-12 ENCOUNTER — Ambulatory Visit: Payer: 59

## 2022-06-12 DIAGNOSIS — I5033 Acute on chronic diastolic (congestive) heart failure: Secondary | ICD-10-CM | POA: Diagnosis not present

## 2022-06-12 LAB — CBC
HCT: 47.5 % — ABNORMAL HIGH (ref 36.0–46.0)
Hemoglobin: 16.1 g/dL — ABNORMAL HIGH (ref 12.0–15.0)
MCH: 25.5 pg — ABNORMAL LOW (ref 26.0–34.0)
MCHC: 33.9 g/dL (ref 30.0–36.0)
MCV: 75.2 fL — ABNORMAL LOW (ref 80.0–100.0)
Platelets: 272 10*3/uL (ref 150–400)
RBC: 6.32 MIL/uL — ABNORMAL HIGH (ref 3.87–5.11)
RDW: 18.4 % — ABNORMAL HIGH (ref 11.5–15.5)
WBC: 9.7 10*3/uL (ref 4.0–10.5)
nRBC: 0 % (ref 0.0–0.2)

## 2022-06-12 LAB — BASIC METABOLIC PANEL
Anion gap: 9 (ref 5–15)
BUN: 15 mg/dL (ref 6–20)
CO2: 33 mmol/L — ABNORMAL HIGH (ref 22–32)
Calcium: 8.4 mg/dL — ABNORMAL LOW (ref 8.9–10.3)
Chloride: 98 mmol/L (ref 98–111)
Creatinine, Ser: 0.91 mg/dL (ref 0.44–1.00)
GFR, Estimated: >60 mL/min (ref >60)
Glucose, Bld: 121 mg/dL — ABNORMAL HIGH (ref 70–99)
Potassium: 3.6 mmol/L (ref 3.5–5.1)
Sodium: 140 mmol/L (ref 135–145)

## 2022-06-12 MED ORDER — DICLOFENAC SODIUM 1 % EX GEL
2.0000 g | Freq: Four times a day (QID) | CUTANEOUS | Status: DC | PRN
  Administered 2022-06-12 – 2022-06-13 (×2): 2 g via TOPICAL
  Filled 2022-06-12: qty 100

## 2022-06-12 NOTE — Progress Notes (Signed)
Pt refused cpap for the night.  

## 2022-06-12 NOTE — Progress Notes (Signed)
     Daily Progress Note Intern Pager: [phone]  Patient name: [name] Medical record number: [id number] Date of birth: 04-18-1965 Age: 57 y.o. Gender: female  Primary Care Provider: Jerre Simon, MD Consultants: None Code Status: Full  Pt Overview and Major Events to Date:  5/12 Admitted   Assessment and Plan: [name] is a 57 y.o. female admitted for AHRF in setting of CHF exacerbation.    Pertinent PMH/PSH includes HFpEF, T2DM, COPD, GERD, HLD, anxiety, chronic back pain, allergic rhinitis.   * Acute on chronic heart failure with preserved ejection fraction (HFpEF) (HCC) Weaned O2 to 2L (satting 88-90%, at goal). UOP 3.4L yesterday. Pt weight now ~10lb down from yesterday, likely a component of measurement error, but overall downtrend in wt since admission, good UOP w/ diuresis, and ability to wean O2 makes CHF exac the primary differential at this time. COPD exac is considered, although no wheezing on exam.  -Continue Lasix 40 IV twice daily - Will add 1.5L fluid restricition given continued weight gain - Wean O2 as tolerated - Cont home losartan - Plan to start SGLT2 prior to discharge - Strict I/O - Daily weight  Essential hypertension -Continue home losartan 25 mg -Continue diltiazem 120 mg twice daily  Type 2 diabetes mellitus with stage 3 chronic kidney disease, with long-term current use of insulin (HCC) Diet-controlled - Given HFpEF, plan to start SGLT2 prior to discharge.   FEN/GI: Heart healthy with 1.5 L fluid restriction PPx: Lovenox Dispo:SNF in 2-3 days. Barriers include continued O2 requirement and need for IV meds.   Subjective:  NAEO. Reports subjectively feeling much better today.  Objective: Temp:  [97.9 F (36.6 C)-98.4 F (36.9 C)] 97.9 F (36.6 C) (05/15 0725) Pulse Rate:  [76-79] 77 (05/15 0403) Resp:  [18-20] 18 (05/15 0725) BP: (102-169)/(69-93) 169/82 (05/15 0725) SpO2:  [90 %-97 %] 91 % (05/15 0725) Weight:  [124.5  kg] 124.5 kg (05/15 0407) Physical Exam: General: Alert, comfortably laying in bed.  NAD. HEENT: MMM.  NCAT. Cardiovascular: RRR, no murmurs Respiratory: CTAB.  Satting 88-90% on 2L while talking.  Abdomen: Soft, nontender, nondistended. Normal BS.  Extremities: 1+ pitting edema  Laboratory: Most recent CBC Lab Results  Component Value Date   WBC 10.6 (H) 06/11/2022   HGB 16.9 (H) 06/11/2022   HCT 50.7 (H) 06/11/2022   MCV 74.0 (L) 06/11/2022   PLT 320 06/11/2022   Most recent BMP    Latest Ref Rng & Units 06/11/2022    1:14 AM  BMP  Glucose 70 - 99 mg/dL 308   BUN 6 - 20 mg/dL 19   Creatinine 6.57 - 1.00 mg/dL 8.46   Sodium 962 - 952 mmol/L 142   Potassium 3.5 - 5.1 mmol/L 3.4   Chloride 98 - 111 mmol/L 101   CO2 22 - 32 mmol/L 30   Calcium 8.9 - 10.3 mg/dL 8.7    Lincoln Brigham, MD 06/12/2022, 9:38 AM  PGY-1, [place] [facility] FPTS Intern pager: [phone], text pages welcome Secure chat group [facility] Teaching Service

## 2022-06-12 NOTE — Progress Notes (Signed)
Physical Therapy Treatment Patient Details Name: [name] MRN: [id number] DOB: [birth year] Today's Date: 06/12/2022   History of Present Illness Pt is a 57 y.o. female presenting with SOB and fluid overload. Pt also reports a fall the morning PTA due to her right knee giving out. PMH includes: anxiety, arthritis, bipolar disorder, CKD stage 3, COPD, depression, DM2, alcoholism, cervical fusion 2013, L shoulder surgery 2021, Laminectomies 2022.    PT Comments    Pt making steady progress towards her physical therapy goals and is agreeable to participate. Pt able to perform warm up exercises and ambulating ~30 ft with a Rollator at a min guard assist level. Continues to require up to moderate assist for transfers. VSS on 2L O2. Presents with decreased cardiopulmonary endurance, impaired standing balance, weakness. Due to deficits and decreased caregiver support, will benefit from post acute rehab to address.    Recommendations for follow up therapy are one component of a multi-disciplinary discharge planning process, led by the attending physician.  Recommendations may be updated based on patient status, additional functional criteria and insurance authorization.  Follow Up Recommendations  Can patient physically be transported by private vehicle: No    Assistance Recommended at Discharge Frequent or constant Supervision/Assistance  Patient can return home with the following A lot of help with walking and/or transfers;A lot of help with bathing/dressing/bathroom;Assist for transportation;Help with stairs or ramp for entrance   Equipment Recommendations  Rollator (4 wheels)    Recommendations for Other Services       Precautions / Restrictions Precautions Precautions: Fall Restrictions Weight Bearing Restrictions: No     Mobility  Bed Mobility Overal bed mobility: Needs Assistance Bed Mobility: Supine to Sit, Sit to Supine     Supine to sit: Min guard Sit to supine: Min  guard   General bed mobility comments: Increased time, HOB elevated, no physical assist required    Transfers Overall transfer level: Needs assistance Equipment used: Rollator (4 wheels) Transfers: Sit to/from Stand Sit to Stand: Mod assist           General transfer comment: modA to power up from elevated surface height    Ambulation/Gait Ambulation/Gait assistance: Min guard Gait Distance (Feet): 30 Feet Assistive device: Rollator (4 wheels) Gait Pattern/deviations: Step-through pattern, Decreased stride length, Trunk flexed Gait velocity: decreased     General Gait Details: Slow and effortful pace, min guard for safety   Stairs             Wheelchair Mobility    Modified Rankin (Stroke Patients Only)       Balance Overall balance assessment: Needs assistance Sitting-balance support: Feet supported, Single extremity supported Sitting balance-Leahy Scale: Fair     Standing balance support: Bilateral upper extremity supported Standing balance-Leahy Scale: Poor Standing balance comment: reliant on external support                            Cognition Arousal/Alertness: Awake/alert Behavior During Therapy: WFL for tasks assessed/performed Overall Cognitive Status: Within Functional Limits for tasks assessed                                 General Comments: WFL for basic mobility        Exercises General Exercises - Upper Extremity Shoulder Flexion: AROM, Both, 10 reps, Seated General Exercises - Lower Extremity Ankle Circles/Pumps: Both, 20 reps, Seated Long Arc Quad:  Both, 10 reps, Seated    General Comments        Pertinent Vitals/Pain Pain Assessment Pain Assessment: Faces Faces Pain Scale: Hurts a little bit Pain Location: R upper trapezius Pain Descriptors / Indicators: Constant, Spasm Pain Intervention(s): Limited activity within patient's tolerance, Monitored during session    Home Living                           Prior Function            PT Goals (current goals can now be found in the care plan section) Acute Rehab PT Goals Patient Stated Goal: home PT Goal Formulation: With patient Time For Goal Achievement: 06/24/22 Potential to Achieve Goals: Good Progress towards PT goals: Progressing toward goals    Frequency    Min 1X/week      PT Plan Current plan remains appropriate    Co-evaluation              AM-PAC PT "6 Clicks" Mobility   Outcome Measure  Help needed turning from your back to your side while in a flat bed without using bedrails?: A Little Help needed moving from lying on your back to sitting on the side of a flat bed without using bedrails?: A Little Help needed moving to and from a bed to a chair (including a wheelchair)?: A Little Help needed standing up from a chair using your arms (e.g., wheelchair or bedside chair)?: A Lot Help needed to walk in hospital room?: A Little Help needed climbing 3-5 steps with a railing? : Total 6 Click Score: 15    End of Session Equipment Utilized During Treatment: Gait belt;Oxygen (2LO2) Activity Tolerance: Patient tolerated treatment well Patient left: in bed;with call bell/phone within reach;with bed alarm set Nurse Communication: Mobility status PT Visit Diagnosis: Unsteadiness on feet (R26.81);History of falling (Z91.81)     Time: 6578-4696 PT Time Calculation (min) (ACUTE ONLY): 24 min  Charges:  $Therapeutic Activity: 23-37 mins                     Lillia Pauls, PT, DPT Acute Rehabilitation Services Office [phone]    Norval Morton 06/12/2022, 2:02 PM

## 2022-06-13 ENCOUNTER — Other Ambulatory Visit (HOSPITAL_COMMUNITY): Payer: Self-pay

## 2022-06-13 ENCOUNTER — Ambulatory Visit: Payer: 59

## 2022-06-13 ENCOUNTER — Telehealth (HOSPITAL_COMMUNITY): Payer: Self-pay | Admitting: Pharmacy Technician

## 2022-06-13 DIAGNOSIS — N1831 Chronic kidney disease, stage 3a: Secondary | ICD-10-CM | POA: Diagnosis not present

## 2022-06-13 DIAGNOSIS — Z794 Long term (current) use of insulin: Secondary | ICD-10-CM

## 2022-06-13 DIAGNOSIS — I5033 Acute on chronic diastolic (congestive) heart failure: Secondary | ICD-10-CM | POA: Diagnosis not present

## 2022-06-13 DIAGNOSIS — E1122 Type 2 diabetes mellitus with diabetic chronic kidney disease: Secondary | ICD-10-CM

## 2022-06-13 LAB — BASIC METABOLIC PANEL
Anion gap: 11 (ref 5–15)
BUN: 12 mg/dL (ref 6–20)
CO2: 33 mmol/L — ABNORMAL HIGH (ref 22–32)
Calcium: 8.6 mg/dL — ABNORMAL LOW (ref 8.9–10.3)
Chloride: 94 mmol/L — ABNORMAL LOW (ref 98–111)
Creatinine, Ser: 0.9 mg/dL (ref 0.44–1.00)
GFR, Estimated: >60 mL/min (ref >60)
Glucose, Bld: 194 mg/dL — ABNORMAL HIGH (ref 70–99)
Potassium: 3 mmol/L — ABNORMAL LOW (ref 3.5–5.1)
Sodium: 138 mmol/L (ref 135–145)

## 2022-06-13 MED ORDER — EMPAGLIFLOZIN 10 MG PO TABS
10.0000 mg | ORAL_TABLET | Freq: Every day | ORAL | Status: DC
  Administered 2022-06-13 – 2022-06-14 (×2): 10 mg via ORAL
  Filled 2022-06-13 (×2): qty 1

## 2022-06-13 MED ORDER — POTASSIUM CHLORIDE CRYS ER 20 MEQ PO TBCR
40.0000 meq | EXTENDED_RELEASE_TABLET | ORAL | Status: AC
  Administered 2022-06-13 (×3): 40 meq via ORAL
  Filled 2022-06-13 (×3): qty 2

## 2022-06-13 MED ORDER — FUROSEMIDE 40 MG PO TABS
80.0000 mg | ORAL_TABLET | Freq: Two times a day (BID) | ORAL | Status: DC
  Administered 2022-06-13 – 2022-06-14 (×2): 80 mg via ORAL
  Filled 2022-06-13 (×2): qty 2

## 2022-06-13 NOTE — Inpatient Diabetes Management (Signed)
Inpatient Diabetes Program Recommendations  AACE/ADA: New Consensus Statement on Inpatient Glycemic Control (2015)  Target Ranges:  Prepandial:   less than 140 mg/dL      Peak postprandial:   less than 180 mg/dL (1-2 hours)      Critically ill patients:  140 - 180 mg/dL   Lab Results  Component Value Date   GLUCAP 103 (H) 06/09/2022   HGBA1C 6.6 (H) 06/10/2022    Review of Glycemic Control  Diabetes history: DM 2 Outpatient Diabetes medications: diet controlled Current orders for Inpatient glycemic control:  Jardiance 10 mg Daily  A1c 6.6 % on 5/13  Inpatient Diabetes Program Recommendations:    -  please order CBG checks tid + hs while inpatient to monitor glucose trends while starting new medications such as Jardiance  Thanks,  Christena Deem RN, MSN, BC-ADM Inpatient Diabetes Coordinator Team Pager [phone] (8a-5p)

## 2022-06-13 NOTE — Progress Notes (Signed)
     Daily Progress Note Intern Pager: [phone]  Patient name: [name] Medical record number: [id number] Date of birth: 1965/07/13 Age: 57 y.o. Gender: female  Primary Care Provider: Jerre Simon, MD Consultants: none Code Status: Full  Pt Overview and Major Events to Date:  5/12 Admitted  Assessment and Plan: [name] is a 57 y.o. female admitted for AHRF in setting of CHF exacerbation.    Pertinent PMH/PSH includes HFpEF, T2DM, COPD, GERD, HLD, anxiety, chronic back pain, allergic rhinitis.   * Acute on chronic heart failure with preserved ejection fraction (HFpEF) (HCC) Continues to have good UOP on IV lasix (net -7.8L since admission). Wt stable. Unable to wean below 2L this AM.  Given that patient used to be on 4 L home oxygen (prior to gastric surg and weight loss) and has polycythemia (suggesting chronic hypoxia), suspect that patient has a baseline O2 requirement.  - Transition IV to PO Lasix 80 BID - Start Jardiance 10mg  daily - Cont home losartan - K 3.0, repleted PO K x3 - AM BMP and Mg - Strict I/O - Daily weight  Essential hypertension -Continue home losartan 25 mg -Continue diltiazem 120 mg twice daily  Type 2 diabetes mellitus with stage 3 chronic kidney disease, with long-term current use of insulin (HCC) - Jardiance as above   FEN/GI: Heart healthy with 1.5 L fluid restriction PPx: Lovenox Dispo: Transitioning to p.o. meds today, plan to discharge to SNF tomorrow.  Subjective:  Reports not feeling short of breath.  At home, she does not wear her CPAP and reports that she has no trouble sleeping.  Reports that she used to be on 4 L [place] at baseline in the past, but stopped after she had weight loss with her gastric surgery.  No longer has an oxygen machine at home.  Objective: Temp:  [97.9 F (36.6 C)-98.2 F (36.8 C)] 98.2 F (36.8 C) (05/16 1105) Pulse Rate:  [75-77] 75 (05/16 1105) Resp:  [18-20] 20 (05/16 1105) BP:  (150-160)/(74-89) 152/84 (05/16 1105) SpO2:  [83 %-95 %] 91 % (05/16 1105) Weight:  [125.1 kg] 125.1 kg (05/16 0512) Physical Exam: General: Alert, pleasant, well-appearing woman.  NAD. HEENT: NCAT.  MMM. Cardiovascular: RRR, no murmurs Respiratory: CTAB.  Satting greater than 88% on 2 L [place].  Speaking in full sentences Abdomen: Soft, nontender, nondistended Extremities: 1+ pitting edema bilaterally  Laboratory: Most recent CBC Lab Results  Component Value Date   WBC 9.7 06/12/2022   HGB 16.1 (H) 06/12/2022   HCT 47.5 (H) 06/12/2022   MCV 75.2 (L) 06/12/2022   PLT 272 06/12/2022   Most recent BMP    Latest Ref Rng & Units 06/13/2022    1:02 AM  BMP  Glucose 70 - 99 mg/dL 308   BUN 6 - 20 mg/dL 12   Creatinine 6.57 - 1.00 mg/dL 8.46   Sodium 962 - 952 mmol/L 138   Potassium 3.5 - 5.1 mmol/L 3.0   Chloride 98 - 111 mmol/L 94   CO2 22 - 32 mmol/L 33   Calcium 8.9 - 10.3 mg/dL 8.6    Lincoln Brigham, MD 06/13/2022, 1:00 PM  PGY-1, [facility] Family Medicine FPTS Intern pager: [phone], text pages welcome Secure chat group [facility] Teaching Service

## 2022-06-13 NOTE — Telephone Encounter (Signed)
Pharmacy Patient Advocate Encounter  Insurance verification completed.    The patient is insured through [employer] Medicare Part D   The patient is currently admitted and ran test claims for the following: Farxiga, Jardiance.  Copays and coinsurance results were relayed to Inpatient clinical team.  

## 2022-06-13 NOTE — TOC Benefit Eligibility Note (Signed)
Patient Advocate Encounter  Insurance verification completed.    The patient is currently admitted and upon discharge could be taking Farxiga 10 mg.  The current 30 day co-pay is $0.00.   The patient is currently admitted and upon discharge could be taking Jardiance 10 mg.  The current 30 day co-pay is $0.00.   The patient is insured through [employer] Medicare Part D   This test claim was processed through [facility]- copay amounts may vary at other pharmacies due to pharmacy/plan contracts, or as the patient moves through the different stages of their insurance plan.  Kendre Sires, CPHT Pharmacy Patient Advocate Specialist  Pharmacy Patient Advocate Team Direct Number: [phone]  Fax: [phone]       

## 2022-06-13 NOTE — Care Management Important Message (Signed)
Important Message  Patient Details  Name: [name] MRN: [id number] Date of Birth: Sep 24, 1965   Medicare Important Message Given:  Yes     Renie Ora 06/13/2022, 8:37 AM

## 2022-06-13 NOTE — TOC Progression Note (Signed)
Transition of Care [facility]) - Progression Note    Patient Details  Name: [name] MRN: [id number] Date of Birth: 1965/03/03  [facility] CM/SW Contact  Leander Rams, LCSW Phone Number: 06/13/2022, 11:30 AM  Clinical Narrative:    CSW spoke with pt and she has chosen to go to [facility] for [employer] Insurance Berkley Harvey has been approved.  TOC will continue to follow.    Expected Discharge Plan: Skilled Nursing Facility Barriers to Discharge: Continued Medical Work up  Expected Discharge Plan and Services       Living arrangements for the past 2 months: Single Family Home                                       Social Determinants of Health (SDOH) Interventions SDOH Screenings   Food Insecurity: No Food Insecurity (06/09/2022)  Housing: Low Risk  (06/09/2022)  Transportation Needs: Unmet Transportation Needs (06/09/2022)  Utilities: Not At Risk (06/09/2022)  Alcohol Screen: Low Risk  (04/02/2022)  Depression (PHQ2-9): Low Risk  (04/02/2022)  Financial Resource Strain: Low Risk  (06/06/2022)  Physical Activity: Inactive (06/06/2022)  Social Connections: Socially Isolated (06/06/2022)  Stress: No Stress Concern Present (06/06/2022)  Tobacco Use: High Risk (06/09/2022)    Readmission Risk Interventions     No data to display         Oletta Lamas, MSW, LCSWA, LCASA Transitions of Care  Clinical Social Worker I

## 2022-06-13 NOTE — Progress Notes (Signed)
Occupational Therapy Treatment Patient Details Name: [name] MRN: [id number] DOB: 03-18-1965 Today's Date: 06/13/2022   History of present illness Pt is a 57 y.o. female presenting with SOB and fluid overload. Pt also reports a fall the morning PTA due to her right knee giving out. PMH includes: anxiety, arthritis, bipolar disorder, CKD stage 3, COPD, depression, DM2, alcoholism, cervical fusion 2013, L shoulder surgery 2021, Laminectomies 2022.   OT comments  Today's OT session very limited. Pt sat EOB ~15 mins completing scooting and lateral weight shifts with min guard assist. Pt declined OOB mobility due to wanting to get her medication setup before leaving acute hospital. Pt needing increased time in today's OT session to use purewick. Unable to perform AROM and strengthening exercises while EOB as pt was on the phone throughout session. OT to continue to progress pt as able. DC plans remain appropriate for SNF   Recommendations for follow up therapy are one component of a multi-disciplinary discharge planning process, led by the attending physician.  Recommendations may be updated based on patient status, additional functional criteria and insurance authorization.    Assistance Recommended at Discharge Frequent or constant Supervision/Assistance  Patient can return home with the following  A little help with walking and/or transfers;A lot of help with bathing/dressing/bathroom;Assist for transportation;Assistance with cooking/housework;Help with stairs or ramp for entrance   Equipment Recommendations  Other (comment) (defer to next level of care)    Recommendations for Other Services      Precautions / Restrictions Precautions Precautions: Fall Restrictions Weight Bearing Restrictions: No       Mobility Bed Mobility Overal bed mobility: Needs Assistance Bed Mobility: Supine to Sit, Sit to Supine     Supine to sit: Supervision Sit to supine: Min guard   General bed  mobility comments: Lateral scooting to HOB, Min guard but pt takes increased time. Pt sat EOB for ~15 mins to finish urinating and make calls to prepare medication    Transfers                   General transfer comment: N/A pt declined     Balance Overall balance assessment: Needs assistance Sitting-balance support: Feet supported, Single extremity supported Sitting balance-Leahy Scale: Fair                                     ADL either performed or assessed with clinical judgement   ADL                                         General ADL Comments: Very limited session, pt declined any mobility until getting her medical situation handled (calling son and pharmacy to procure her pain meds in prep for going to SNF)    Extremity/Trunk Assessment              Vision       Perception     Praxis      Cognition Arousal/Alertness: Awake/alert Behavior During Therapy: WFL for tasks assessed/performed Overall Cognitive Status: Within Functional Limits for tasks assessed                                          Exercises  Shoulder Instructions       General Comments VSS 2L    Pertinent Vitals/ Pain       Pain Assessment Pain Assessment: Faces Pain Score: 8  Faces Pain Scale: Hurts whole lot Pain Location: neck, shoulders, back Pain Descriptors / Indicators: Constant, Spasm, Aching Pain Intervention(s): Limited activity within patient's tolerance, Monitored during session, RN gave pain meds during session  Home Living                                          Prior Functioning/Environment              Frequency  Min 2X/week        Progress Toward Goals  OT Goals(current goals can now be found in the care plan section)  Progress towards OT goals: Not progressing toward goals - comment (limited session, OT to progress pt as able)  Acute Rehab OT Goals Patient Stated  Goal: To get my health together; energy back OT Goal Formulation: With patient Time For Goal Achievement: 06/24/22 Potential to Achieve Goals: Good  Plan Discharge plan remains appropriate;Frequency remains appropriate    Co-evaluation                 AM-PAC OT "6 Clicks" Daily Activity     Outcome Measure   Help from another person eating meals?: None Help from another person taking care of personal grooming?: A Little Help from another person toileting, which includes using toliet, bedpan, or urinal?: A Lot Help from another person bathing (including washing, rinsing, drying)?: A Lot Help from another person to put on and taking off regular upper body clothing?: A Little Help from another person to put on and taking off regular lower body clothing?: A Lot 6 Click Score: 16    End of Session Equipment Utilized During Treatment: Gait belt;Oxygen  OT Visit Diagnosis: Unsteadiness on feet (R26.81);Repeated falls (R29.6);History of falling (Z91.81)   Activity Tolerance Treatment limited secondary to medical complications (Comment) (Pt declined OOB mobility until she was able to get medication prepared for SNF placement)   Patient Left in bed;with call bell/phone within reach;with bed alarm set   Nurse Communication Mobility status        Time: 4540-9811 OT Time Calculation (min): 18 min  Charges: OT General Charges $OT Visit: 1 Visit OT Treatments $Therapeutic Activity: 8-22 mins  06/13/2022  AB, OTR/L  Acute Rehabilitation Services  Office: [phone]   Tristan Schroeder 06/13/2022, 4:57 PM

## 2022-06-14 ENCOUNTER — Other Ambulatory Visit: Payer: Self-pay | Admitting: Student

## 2022-06-14 DIAGNOSIS — I5033 Acute on chronic diastolic (congestive) heart failure: Secondary | ICD-10-CM | POA: Diagnosis not present

## 2022-06-14 DIAGNOSIS — E1142 Type 2 diabetes mellitus with diabetic polyneuropathy: Secondary | ICD-10-CM

## 2022-06-14 LAB — BASIC METABOLIC PANEL
Anion gap: 9 (ref 5–15)
BUN: 14 mg/dL (ref 6–20)
CO2: 35 mmol/L — ABNORMAL HIGH (ref 22–32)
Calcium: 8.7 mg/dL — ABNORMAL LOW (ref 8.9–10.3)
Chloride: 96 mmol/L — ABNORMAL LOW (ref 98–111)
Creatinine, Ser: 1.04 mg/dL — ABNORMAL HIGH (ref 0.44–1.00)
GFR, Estimated: >60 mL/min (ref >60)
Glucose, Bld: 165 mg/dL — ABNORMAL HIGH (ref 70–99)
Potassium: 3.9 mmol/L (ref 3.5–5.1)
Sodium: 140 mmol/L (ref 135–145)

## 2022-06-14 LAB — CBC
HCT: 49.3 % — ABNORMAL HIGH (ref 36.0–46.0)
Hemoglobin: 16.4 g/dL — ABNORMAL HIGH (ref 12.0–15.0)
MCH: 25 pg — ABNORMAL LOW (ref 26.0–34.0)
MCHC: 33.3 g/dL (ref 30.0–36.0)
MCV: 75.3 fL — ABNORMAL LOW (ref 80.0–100.0)
Platelets: 264 10*3/uL (ref 150–400)
RBC: 6.55 MIL/uL — ABNORMAL HIGH (ref 3.87–5.11)
RDW: 18.6 % — ABNORMAL HIGH (ref 11.5–15.5)
WBC: 8.9 10*3/uL (ref 4.0–10.5)
nRBC: 0 % (ref 0.0–0.2)

## 2022-06-14 LAB — MAGNESIUM: Magnesium: 1.6 mg/dL — ABNORMAL LOW (ref 1.7–2.4)

## 2022-06-14 MED ORDER — BACLOFEN 10 MG PO TABS: 10.0000 mg | ORAL_TABLET | Freq: Three times a day (TID) | ORAL | 0 refills | Status: DC

## 2022-06-14 MED ORDER — MAGNESIUM SULFATE 2 GM/50ML IV SOLN
2.0000 g | Freq: Once | INTRAVENOUS | Status: AC
  Administered 2022-06-14: 2 g via INTRAVENOUS
  Filled 2022-06-14: qty 50

## 2022-06-14 MED ORDER — NICOTINE 21 MG/24HR TD PT24: 21.0000 mg | MEDICATED_PATCH | Freq: Every day | TRANSDERMAL | 0 refills | Status: DC

## 2022-06-14 MED ORDER — EMPAGLIFLOZIN 10 MG PO TABS: 10.0000 mg | ORAL_TABLET | Freq: Every day | ORAL | Status: DC

## 2022-06-14 MED ORDER — POTASSIUM CHLORIDE CRYS ER 20 MEQ PO TBCR
20.0000 meq | EXTENDED_RELEASE_TABLET | Freq: Once | ORAL | Status: AC
  Administered 2022-06-14: 20 meq via ORAL
  Filled 2022-06-14: qty 1

## 2022-06-14 MED ORDER — FUROSEMIDE 80 MG PO TABS: 80.0000 mg | ORAL_TABLET | Freq: Two times a day (BID) | ORAL | 0 refills | Status: DC

## 2022-06-14 MED ORDER — FUROSEMIDE 80 MG PO TABS: 80.0000 mg | ORAL_TABLET | Freq: Two times a day (BID) | ORAL | Status: DC

## 2022-06-14 NOTE — Progress Notes (Signed)
Mobility Specialist Progress Note:   06/14/22 1100  Mobility  Activity Ambulated with assistance in hallway  Level of Assistance Contact guard assist, steadying assist  Assistive Device Front wheel walker  Distance Ambulated (ft) 80 ft  Activity Response Tolerated well  Mobility Referral Yes  $Mobility charge 1 Mobility  Mobility Specialist Start Time (ACUTE ONLY) 1100  Mobility Specialist Stop Time (ACUTE ONLY) 1115  Mobility Specialist Time Calculation (min) (ACUTE ONLY) 15 min   Pt eager for mobility session. Received on 1.5LO2 at rest, SpO2 93%. Required 3LO2 during ambulation. X1 seated rest break taken d/t mild back pain (which is baseline per pt). Pt back in chair with all needs met, back on 1.5L.  Addison Lank Mobility Specialist Please contact via SecureChat or  Rehab office at [phone]

## 2022-06-14 NOTE — Progress Notes (Signed)
Pharmacy notified of pending discharge to SNF. They will return locked med to patient prior to [facility] transport. Report called to receiving nurse at [facility].

## 2022-06-14 NOTE — Progress Notes (Signed)
Heart Failure Navigator Progress Note  Assessed for Heart & Vascular TOC clinic readiness.  Patient does not meet criteria due to EF 60-65% HFpEF, will discharge to SNF with a hospital follow up with Dr. Cristal Deer on 06/27/2022 @ 10:20 am. .   Navigator will sign off at this time.   Rhae Hammock, BSN, Scientist, clinical (histocompatibility and immunogenetics) Only

## 2022-06-14 NOTE — TOC Transition Note (Signed)
Transition of Care [facility]) - CM/SW Discharge Note   Patient Details  Name: [name] MRN: [id number] Date of Birth: [birth year]  Transition of Care [facility]) CM/SW Contact:  Leander Rams, LCSW Phone Number: 06/14/2022, 1:43 PM   Clinical Narrative:    Patient will DC to: [facility] Anticipated DC date: 06/14/2022 Family notified: N/A Transport by: Sharin Mons   Per MD patient ready for DC to [facility]. RN, patient, patient's family, and facility notified of DC. Discharge Summary and FL2 sent to facility. RN to call report prior to discharge [phone]. DC packet on chart. Ambulance transport requested for patient.   CSW will sign off for now as social work intervention is no longer needed. Please consult [place] again if new needs arise.    Final next level of care: Skilled Nursing Facility Barriers to Discharge: No Barriers Identified   Patient Goals and CMS Choice      Discharge Placement                Patient chooses bed at: [facility] Patient to be transferred to facility by: [facility] Name of family member notified: N/A Patient and family notified of of transfer: 06/14/22  Discharge Plan and Services Additional resources added to the After Visit Summary for                                       Social Determinants of Health (SDOH) Interventions SDOH Screenings   Food Insecurity: No Food Insecurity (06/09/2022)  Housing: Low Risk  (06/09/2022)  Transportation Needs: Unmet Transportation Needs (06/09/2022)  Utilities: Not At Risk (06/09/2022)  Alcohol Screen: Low Risk  (04/02/2022)  Depression (PHQ2-9): Low Risk  (04/02/2022)  Financial Resource Strain: Low Risk  (06/06/2022)  Physical Activity: Inactive (06/06/2022)  Social Connections: Socially Isolated (06/06/2022)  Stress: No Stress Concern Present (06/06/2022)  Tobacco Use: High Risk (06/09/2022)     Readmission Risk Interventions     No data to display          Oletta Lamas,  MSW, LCSWA, LCASA Transitions of Care  Clinical Social Worker I

## 2022-06-14 NOTE — Discharge Instructions (Signed)
Dear [name],  Thank you for letting us participate in your care. You were hospitalized for a fall, as well as low oxygen levels and diagnosed with Acute on chronic heart failure with preserved ejection fraction (HFpEF) (HCC). You were treated with intravenous diuretics and oxygen.   POST-HOSPITAL & CARE INSTRUCTIONS START taking Jardiance (Empagliflozin) 10 mg 1 (one) time daily. START taking Lasix (Furosemide) 80 mg 2 (two) times daily. Continue your other medications as prescribed. Go to your follow up appointments (listed below)   DOCTOR'S APPOINTMENT   Future Appointments  Date Time Provider Department Center  06/18/2022  2:10 PM [facility] MM 3 [facility]  04/28/2023  8:00 AM [facility] ANNUAL WELLNESS VISIT [facility]     Take care and be well!  Darral Dash, DO  Family Medicine Teaching Service Inpatient Team [place]  [facility]  [address] [place], [place] [address] [phone]

## 2022-06-14 NOTE — Discharge Summary (Signed)
Family Medicine Teaching [facility] Discharge Summary  Patient name: [name] Medical record number: [id number] Date of birth: Jul 08, 1965 Age: 57 y.o. Gender: female Date of Admission: 06/09/2022  Date of Discharge: 06/14/22 Admitting Physician: Lance Muss, MD  Primary Care Provider: Jerre Simon, MD Consultants: None  Indication for Hospitalization: AHRF 2/2 HFpEF Exacerbation  Brief Hospital Course:  [name] is a 57 y.o. female w/ hx of HFpEF, T2DM, COPD, GERD, HLD, anxiety, chronic back pain, allergic rhinitis admitted for CHF exacerbation.  CHF Exacerbation  HFpEF  Fall Pt presented for fall after R leg "gave out", came to ED and incidentally noted to be hypoxic. Pt was placed on 2L [place], then turned up to 4L. BNP elevated. Appeared fluid overloaded on exam. L foot and R knee XR showed no acute changes, but did show mild to moderate OA of R knee. CXR showed no acute changes.   Pt was treated with IV Lasix, had good UOP. Oxygen was weaned as tolerated until pt was satting well on RA. Repeat echo showed stable EF 60-65% (previously 55-60% in 2019). Transitioned from IV to PO lasix successfully with a net negative 10L since admission. Wt at discharge was 266.5 lbs (Dry weight).  Ambulated to monitor oxygen, determined pt will need 2L as new baseline. Given chronic polycythemia, pt likely had chronic hypoxia prior to admission.   PCP Follow-up 1) Consider starting GLP-1 given comorbid HFpEF,  T2DM, OSA and obesity.  2) Monitor BMP given Lasix and Jardiance started during this admission 3) Needs home oxygen machine for new O2 requirement. Was discharged on 2L [place] (previously on RA), can wean if tolerated.   Discharge Diagnoses/Problem List:  CHF Exacerbation AKI T2DM  Disposition: SNF  Discharge Condition: Improved and stable  Discharge Exam:  Gen: Alert, pleasant woman laying in bed comfortably. Speaking in full sentences.  HEENT: CVAT.  MMM. Pulm:  CTAB. Normal WOB on 2L.  CV: RRR, no murmurs Abm: Soft, nontender, nondistended Ext: Trace edema bilaterally  Significant Labs and Imaging:  Recent Labs  Lab 06/14/22 0120  WBC 8.9  HGB 16.4*  HCT 49.3*  PLT 264   Recent Labs  Lab 06/13/22 0102 06/14/22 0120  NA 138 140  K 3.0* 3.9  CL 94* 96*  CO2 33* 35*  GLUCOSE 194* 165*  BUN 12 14  CREATININE 0.90 1.04*  CALCIUM 8.6* 8.7*  MG  --  1.6*    Results/Tests Pending at Time of Discharge: none  Discharge Medications:  Allergies as of 06/14/2022       Reactions   Aspirin Other (See Comments)   Kidney disease   Methadone Hcl Other (See Comments)    "blacked out" in 1990's   Levofloxacin Itching        Medication List     STOP taking these medications    hydrOXYzine 10 MG tablet Commonly known as: ATARAX   methocarbamol 500 MG tablet Commonly known as: ROBAXIN   metolazone 2.5 MG tablet Commonly known as: ZAROXOLYN       TAKE these medications    Accu-Chek Aviva Plus test strip Generic drug: glucose blood 1 EACH BY OTHER ROUTE 3 (THREE) TIMES DAILY. TEST 3 TIMES DAILY   acetaminophen 500 MG tablet Commonly known as: TYLENOL Take 1,000 mg by mouth 2 (two) times daily.   albuterol 108 (90 Base) MCG/ACT inhaler Commonly known as: VENTOLIN HFA TAKE 2 PUFFS BY MOUTH EVERY 6 HOURS AS NEEDED FOR WHEEZE OR SHORTNESS OF BREATH  Strength: 108 (90 Base) MCG/ACT What changed:  how much to take how to take this when to take this reasons to take this   ammonium lactate 12 % lotion Commonly known as: LAC-HYDRIN For dry skin What changed:  how much to take how to take this when to take this   atorvastatin 40 MG tablet Commonly known as: LIPITOR TAKE ONE TABLET BY MOUTH EVERY DAY   baclofen 10 MG tablet Commonly known as: LIORESAL Take 1 tablet (10 mg total) by mouth 3 (three) times daily.   Bariatric Multivitamins/Iron Caps Take 1 capsule by mouth 2 (two) times daily.   Belbuca 900 MCG  Film Generic drug: Buprenorphine HCl Place 1 Film inside cheek 2 (two) times daily.   busPIRone 10 MG tablet Commonly known as: BUSPAR TAKE ONE TABLET BY MOUTH THREE TIMES DAILY   calcium carbonate 1500 (600 Ca) MG Tabs tablet Commonly known as: OSCAL Take 1,500 mg by mouth 2 (two) times daily.   diclofenac Sodium 1 % Gel Commonly known as: VOLTAREN APPLY FOUR grams FOUR TIMES DAILY AS DIRECTED What changed: See the new instructions.   diltiazem 120 MG 24 hr capsule Commonly known as: CARDIZEM CD Take 1 capsule (120 mg total) by mouth 2 (two) times daily.   empagliflozin 10 MG Tabs tablet Commonly known as: JARDIANCE Take 1 tablet (10 mg total) by mouth daily. Start taking on: Jun 15, 2022   fluticasone 50 MCG/ACT nasal spray Commonly known as: FLONASE INSTILL TWO SPRAYS INTO BOTH NOSTRILS TWICE DAILY What changed:  how much to take how to take this when to take this   furosemide 80 MG tablet Commonly known as: LASIX Take 1 tablet (80 mg total) by mouth 2 (two) times daily.   gabapentin 300 MG capsule Commonly known as: NEURONTIN TAKE TWO CAPSULES BY MOUTH EVERY MORNING, AND EVERY LUNCH, ant TAKE THREE CAPSULES BY MOUTH EVERY IN THE EVENING What changed:  how much to take how to take this when to take this   ketoconazole 2 % cream Commonly known as: NIZORAL Apply 1 Application topically daily.   linaclotide 72 MCG capsule Commonly known as: LINZESS Take 72 mcg by mouth daily before breakfast.   loratadine 10 MG tablet Commonly known as: CLARITIN Take 1 tablet (10 mg total) by mouth daily.   losartan 25 MG tablet Commonly known as: COZAAR Take 1 tablet (25 mg total) by mouth at bedtime.   montelukast 10 MG tablet Commonly known as: SINGULAIR TAKE 1 TABLET BY MOUTH EVERYDAY AT BEDTIME Strength: 10 mg What changed:  how much to take how to take this when to take this additional instructions   Narcan 4 MG/0.1ML Liqd nasal spray kit Generic drug:  naloxone Place 0.4 mg into the nose once as needed (accidental overdose).   nicotine 21 mg/24hr patch Commonly known as: NICODERM CQ - dosed in mg/24 hours Place 1 patch (21 mg total) onto the skin daily. Start taking on: Jun 15, 2022   nystatin 100000 UNIT/ML suspension Commonly known as: MYCOSTATIN Take 5 mLs (500,000 Units total) by mouth 4 (four) times daily. Swish and swallow. What changed:  when to take this reasons to take this   omeprazole 20 MG capsule Commonly known as: PRILOSEC TAKE ONE CAPSULE BY MOUTH EVERY DAY   potassium chloride SA 20 MEQ tablet Commonly known as: KLOR-CON M Take 20 mEq by mouth daily.   Trelegy Ellipta 100-62.5-25 MCG/ACT Aepb Generic drug: Fluticasone-Umeclidin-Vilant Inhale 1 puff into the lungs daily.  venlafaxine XR 75 MG 24 hr capsule Commonly known as: EFFEXOR-XR Take 1 capsule (75 mg total) by mouth daily with breakfast.        Discharge Instructions: Please refer to Patient Instructions section of EMR for full details.  Patient was counseled important signs and symptoms that should prompt return to medical care, changes in medications, dietary instructions, activity restrictions, and follow up appointments.   Follow-Up Appointments: Will discharge to SNF  Lincoln Brigham, MD 06/14/2022, 11:17 AM PGY-1, [facility]

## 2022-06-14 NOTE — Progress Notes (Signed)
Occupational Therapy Treatment Patient Details Name: [name] MRN: [id number] DOB: 1965/02/28 Today's Date: 06/14/2022   History of present illness Pt is a 57 y.o. female presenting with SOB and fluid overload. Pt also reports a fall the morning PTA due to her right knee giving out. PMH includes: anxiety, arthritis, bipolar disorder, CKD stage 3, COPD, depression, DM2, alcoholism, cervical fusion 2013, L shoulder surgery 2021, Laminectomies 2022.   OT comments  Pt. Seen for skilled OT treatment session. Able to complete bed mobility, lb dressing, and short distance ambulation and transfer.  Rn present and states okay for pt. To be on RA to see how she does.  Pt. Able to maintain 88-90% with initial cues for PLB strategies.  Energy conservation handouts also provided and reviewed for adls and mobility safety.   Recommendations for follow up therapy are one component of a multi-disciplinary discharge planning process, led by the attending physician.  Recommendations may be updated based on patient status, additional functional criteria and insurance authorization.    Assistance Recommended at Discharge Frequent or constant Supervision/Assistance  Patient can return home with the following  A little help with walking and/or transfers;A lot of help with bathing/dressing/bathroom;Assist for transportation;Assistance with cooking/housework;Help with stairs or ramp for entrance   Equipment Recommendations       Recommendations for Other Services      Precautions / Restrictions Precautions Precautions: Fall Precaution Comments: obese, tremors       Mobility Bed Mobility Overal bed mobility: Needs Assistance Bed Mobility: Rolling, Sidelying to Sit Rolling: Supervision Sidelying to sit: Supervision       General bed mobility comments: hob flat to simulate home env. pt. states "i roll and hold onto the mattress just like this" good tech. and safe    Transfers Overall transfer  level: Needs assistance Equipment used: Rolling walker (2 wheels) Transfers: Sit to/from Stand, Bed to chair/wheelchair/BSC Sit to Stand: Min assist, Mod assist Stand pivot transfers: Min guard         General transfer comment: cues for hand placement prior to sitting down     Balance                                           ADL either performed or assessed with clinical judgement   ADL Overall ADL's : Needs assistance/impaired                     Lower Body Dressing: Sitting/lateral leans;Minimal assistance Lower Body Dressing Details (indicate cue type and reason): greater assistance required with RLE vs. LLE. Toilet Transfer: Ambulation;Min Pension scheme manager Details (indicate cue type and reason): simulated with in room mobility         Functional mobility during ADLs: Min guard;Rolling walker (2 wheels) General ADL Comments: pt. wanting to be on RA. RN states okay to trial.  worked on PLB. pt. eager and receptive and demonstrated good carryover.  energy conservation handouts also provided and reviewed with pt. emphasis on not talking during mobility and eating to aide in maintaining o2 levels    Extremity/Trunk Assessment              Vision       Perception     Praxis      Cognition Arousal/Alertness: Awake/alert Behavior During Therapy: WFL for tasks assessed/performed Overall Cognitive Status: Within Functional Limits for tasks  assessed                                          Exercises      Shoulder Instructions       General Comments      Pertinent Vitals/ Pain       Pain Assessment Pain Assessment: No/denies pain  Home Living                                          Prior Functioning/Environment              Frequency  Min 2X/week        Progress Toward Goals  OT Goals(current goals can now be found in the care plan section)  Progress towards OT goals:  Progressing toward goals     Plan Discharge plan remains appropriate;Frequency remains appropriate    Co-evaluation                 AM-PAC OT "6 Clicks" Daily Activity     Outcome Measure   Help from another person eating meals?: None Help from another person taking care of personal grooming?: A Little Help from another person toileting, which includes using toliet, bedpan, or urinal?: A Lot Help from another person bathing (including washing, rinsing, drying)?: A Lot Help from another person to put on and taking off regular upper body clothing?: A Little Help from another person to put on and taking off regular lower body clothing?: A Lot 6 Click Score: 16    End of Session Equipment Utilized During Treatment: Gait belt;Rolling walker (2 wheels)  OT Visit Diagnosis: Unsteadiness on feet (R26.81);Repeated falls (R29.6);History of falling (Z91.81)   Activity Tolerance Patient tolerated treatment well   Patient Left in chair;with call bell/phone within reach;with nursing/sitter in room   Nurse Communication Other (comment) (rn present and states ok for RA trial pt. hoovering 88-90%)        Time: 1610-9604 OT Time Calculation (min): 27 min  Charges: OT General Charges $OT Visit: 1 Visit OT Treatments $Self Care/Home Management : 23-37 mins  Boneta Lucks, COTA/L Acute Rehabilitation [phone]   Alessandra Bevels Lorraine-COTA/L 06/14/2022, 12:41 PM

## 2022-06-17 ENCOUNTER — Ambulatory Visit: Payer: Self-pay | Admitting: Student

## 2022-06-18 ENCOUNTER — Ambulatory Visit: Payer: 59

## 2022-06-27 ENCOUNTER — Ambulatory Visit (HOSPITAL_BASED_OUTPATIENT_CLINIC_OR_DEPARTMENT_OTHER): Payer: 59 | Admitting: Cardiology

## 2022-06-29 DIAGNOSIS — J9621 Acute and chronic respiratory failure with hypoxia: Secondary | ICD-10-CM | POA: Diagnosis not present

## 2022-06-29 DIAGNOSIS — M7541 Impingement syndrome of right shoulder: Secondary | ICD-10-CM | POA: Diagnosis not present

## 2022-06-29 DIAGNOSIS — E785 Hyperlipidemia, unspecified: Secondary | ICD-10-CM | POA: Diagnosis not present

## 2022-06-29 DIAGNOSIS — I10 Essential (primary) hypertension: Secondary | ICD-10-CM | POA: Diagnosis not present

## 2022-06-29 DIAGNOSIS — K146 Glossodynia: Secondary | ICD-10-CM | POA: Diagnosis not present

## 2022-06-29 DIAGNOSIS — J302 Other seasonal allergic rhinitis: Secondary | ICD-10-CM | POA: Diagnosis not present

## 2022-06-29 DIAGNOSIS — M48062 Spinal stenosis, lumbar region with neurogenic claudication: Secondary | ICD-10-CM | POA: Diagnosis not present

## 2022-06-29 DIAGNOSIS — E1140 Type 2 diabetes mellitus with diabetic neuropathy, unspecified: Secondary | ICD-10-CM | POA: Diagnosis not present

## 2022-06-29 DIAGNOSIS — M1611 Unilateral primary osteoarthritis, right hip: Secondary | ICD-10-CM | POA: Diagnosis not present

## 2022-06-29 DIAGNOSIS — E8809 Other disorders of plasma-protein metabolism, not elsewhere classified: Secondary | ICD-10-CM | POA: Diagnosis not present

## 2022-06-29 DIAGNOSIS — I5033 Acute on chronic diastolic (congestive) heart failure: Secondary | ICD-10-CM | POA: Diagnosis not present

## 2022-06-29 DIAGNOSIS — M6281 Muscle weakness (generalized): Secondary | ICD-10-CM | POA: Diagnosis not present

## 2022-06-29 DIAGNOSIS — I471 Supraventricular tachycardia, unspecified: Secondary | ICD-10-CM | POA: Diagnosis not present

## 2022-06-29 DIAGNOSIS — R531 Weakness: Secondary | ICD-10-CM | POA: Diagnosis not present

## 2022-06-29 DIAGNOSIS — E1122 Type 2 diabetes mellitus with diabetic chronic kidney disease: Secondary | ICD-10-CM | POA: Diagnosis not present

## 2022-06-29 DIAGNOSIS — M1711 Unilateral primary osteoarthritis, right knee: Secondary | ICD-10-CM | POA: Diagnosis not present

## 2022-06-29 DIAGNOSIS — M5459 Other low back pain: Secondary | ICD-10-CM | POA: Diagnosis not present

## 2022-06-29 DIAGNOSIS — J4489 Other specified chronic obstructive pulmonary disease: Secondary | ICD-10-CM | POA: Diagnosis not present

## 2022-06-29 DIAGNOSIS — M109 Gout, unspecified: Secondary | ICD-10-CM | POA: Diagnosis not present

## 2022-06-29 DIAGNOSIS — M5414 Radiculopathy, thoracic region: Secondary | ICD-10-CM | POA: Diagnosis not present

## 2022-06-29 DIAGNOSIS — M25512 Pain in left shoulder: Secondary | ICD-10-CM | POA: Diagnosis not present

## 2022-06-29 DIAGNOSIS — E1151 Type 2 diabetes mellitus with diabetic peripheral angiopathy without gangrene: Secondary | ICD-10-CM | POA: Diagnosis not present

## 2022-06-29 DIAGNOSIS — M19011 Primary osteoarthritis, right shoulder: Secondary | ICD-10-CM | POA: Diagnosis not present

## 2022-06-29 DIAGNOSIS — E876 Hypokalemia: Secondary | ICD-10-CM | POA: Diagnosis not present

## 2022-06-30 DIAGNOSIS — M25512 Pain in left shoulder: Secondary | ICD-10-CM | POA: Diagnosis not present

## 2022-06-30 DIAGNOSIS — R531 Weakness: Secondary | ICD-10-CM | POA: Diagnosis not present

## 2022-07-01 ENCOUNTER — Telehealth: Payer: Self-pay

## 2022-07-01 NOTE — Transitions of Care (Post Inpatient/ED Visit) (Signed)
07/01/2022  Name: [name] MRN: [id number] DOB: August 07, 1965  Today's TOC FU Call Status: Today's TOC FU Call Status:: Successful TOC FU Call Competed TOC FU Call Complete Date: 07/01/22  Transition Care Management Follow-up Telephone Call Date of Discharge: 06/30/22 Discharge Facility: Other Recruitment consultant Facility) Name of Other (Non-[facility]) Discharge Facility: [facility] Type of Discharge: Inpatient Admission Primary Inpatient Discharge Diagnosis:: Acute on chronic heart failure with preserved ejection fraction How have you been since you were released from the hospital?: Better Any questions or concerns?: No  Items Reviewed: Did you receive and understand the discharge instructions provided?: Yes Medications obtained,verified, and reconciled?: Yes (Medications Reviewed) Any new allergies since your discharge?: No Dietary orders reviewed?: No Do you have support at home?: Yes  Medications Reviewed Today: Medications Reviewed Today     Reviewed by Merleen Nicely, LPN (Licensed Practical Nurse) on 07/01/22 at 1445  Med List Status: <None>   Medication Order Taking? Sig Documenting Provider Last Dose Status Informant  ACCU-CHEK AVIVA PLUS test strip [id number] Yes 1 EACH BY OTHER ROUTE 3 (THREE) TIMES DAILY. TEST 3 TIMES DAILY Cresenzo, Cyndi Lennert, MD Taking Active Self  acetaminophen (TYLENOL) 500 MG tablet [id number] Yes Take 1,000 mg by mouth 2 (two) times daily. [provider] Taking Active Self  albuterol (VENTOLIN HFA) 108 (90 Base) MCG/ACT inhaler [id number] Yes TAKE 2 PUFFS BY MOUTH EVERY 6 HOURS AS NEEDED FOR WHEEZE OR SHORTNESS OF BREATH Strength: 108 (90 Base) MCG/ACT  Patient taking differently: Inhale 1-2 puffs into the lungs every 6 (six) hours as needed for wheezing or shortness of breath. TAKE 2 PUFFS BY MOUTH EVERY 6 HOURS AS NEEDED FOR WHEEZE OR SHORTNESS OF BREATH Strength: 108 (90 Base) MCG/ACT   Jerre Simon, MD Taking Active Self   ammonium lactate (LAC-HYDRIN) 12 % lotion [id number] Yes For dry skin  Patient taking differently: Apply 1 Application topically 2 (two) times daily. For dry skin   Jerre Simon, MD Taking Active Self  atorvastatin (LIPITOR) 40 MG tablet [id number] Yes TAKE ONE TABLET BY MOUTH EVERY DAY  Patient taking differently: Take 40 mg by mouth daily.   Jerre Simon, MD Taking Active Self  baclofen (LIORESAL) 10 MG tablet [id number] Yes Take 1 tablet (10 mg total) by mouth 3 (three) times daily. Vonna Drafts, MD Taking Active   BELBUCA 900 MCG FILM [id number] Yes Place 1 Film inside cheek 2 (two) times daily. [provider] Taking Active Self  busPIRone (BUSPAR) 10 MG tablet [id number] Yes TAKE ONE TABLET BY MOUTH THREE TIMES DAILY  Patient taking differently: Take 10 mg by mouth 3 (three) times daily.   Jerre Simon, MD Taking Active Self  calcium carbonate (OSCAL) 1500 (600 Ca) MG TABS tablet [id number] Yes Take 1,500 mg by mouth 2 (two) times daily. [provider] Taking Active Self  diclofenac Sodium (VOLTAREN) 1 % GEL [id number] Yes APPLY FOUR grams FOUR TIMES DAILY AS DIRECTED  Patient taking differently: Apply 2 g topically 4 (four) times daily.   Jerre Simon, MD Taking Active Self  diltiazem (CARDIZEM CD) 120 MG 24 hr capsule [id number] Yes Take 1 capsule (120 mg total) by mouth 2 (two) times daily. Jerre Simon, MD Taking Active Self  empagliflozin (JARDIANCE) 10 MG TABS tablet [id number] Yes Take 1 tablet (10 mg total) by mouth daily. Vonna Drafts, MD Taking Active   fluticasone (FLONASE) 50 MCG/ACT nasal spray [id number] Yes INSTILL TWO SPRAYS INTO BOTH NOSTRILS TWICE DAILY  Patient taking differently: Place 2  sprays into both nostrils 2 (two) times daily. INSTILL TWO SPRAYS INTO BOTH NOSTRILS TWICE DAILY   Jerre Simon, MD Taking Active Self  furosemide (LASIX) 80 MG tablet [id number] Yes Take 1 tablet (80 mg total) by mouth 2 (two) times daily. Lincoln Brigham, MD Taking  Active   furosemide (LASIX) 80 MG tablet [id number] Yes Take 1 tablet (80 mg total) by mouth 2 (two) times daily. Vonna Drafts, MD Taking Active   gabapentin (NEURONTIN) 300 MG capsule [id number] Yes TAKE TWO CAPSULES BY MOUTH EVERY MORNING AND EVERY LUNCH AND TAKE THREE CAPSULES BY MOUTH EVERY Lindley Magnus, MD Taking Active   ketoconazole (NIZORAL) 2 % cream [id number] Yes Apply 1 Application topically daily. Helane Gunther, DPM Taking Active Self  linaclotide Karlene Einstein) 72 MCG capsule [id number] Yes Take 72 mcg by mouth daily before breakfast. [provider] Taking Active Self  loratadine (CLARITIN) 10 MG tablet [id number] Yes Take 1 tablet (10 mg total) by mouth daily. Jerre Simon, MD Taking Active Self  losartan (COZAAR) 25 MG tablet [id number] Yes Take 1 tablet (25 mg total) by mouth at bedtime.  Patient taking differently: Take 25 mg by mouth at bedtime.   Jerre Simon, MD Taking Active Self  montelukast (SINGULAIR) 10 MG tablet [id number] Yes TAKE 1 TABLET BY MOUTH EVERYDAY AT BEDTIME Strength: 10 mg  Patient taking differently: Take 10 mg by mouth in the morning.   Jerre Simon, MD Taking Active Self  Multiple Vitamins-Minerals (BARIATRIC MULTIVITAMINS/IRON) CAPS [id number] Yes Take 1 capsule by mouth 2 (two) times daily. [provider] Taking Active Self  NARCAN 4 MG/0.1ML LIQD nasal spray kit [id number] Yes Place 0.4 mg into the nose once as needed (accidental overdose). [provider] Taking Active Self           Med Note Vickey Sages, HEATHER L   Sat Jun 30, 2021 10:54 AM)    nicotine (NICODERM CQ - DOSED IN MG/24 HOURS) 21 mg/24hr patch [id number] Yes Place 1 patch (21 mg total) onto the skin daily. Vonna Drafts, MD Taking Active   nystatin (MYCOSTATIN) 100000 UNIT/ML suspension [id number] Yes Take 5 mLs (500,000 Units total) by mouth 4 (four) times daily. Swish and swallow.  Patient taking differently: Take 5 mLs by mouth 4 (four) times daily as needed.  Swish and swallow.   Jerre Simon, MD Taking Active Self  omeprazole (PRILOSEC) 20 MG capsule [id number] Yes TAKE ONE CAPSULE BY MOUTH EVERY DAY Jerre Simon, MD Taking Active Self  potassium chloride SA (KLOR-CON M) 20 MEQ tablet [id number] Yes Take 20 mEq by mouth daily. [provider] Taking Active Self  TRELEGY ELLIPTA 100-62.5-25 MCG/ACT AEPB [id number] Yes Inhale 1 puff into the lungs daily. Jerre Simon, MD Taking Active Self  venlafaxine XR (EFFEXOR-XR) 75 MG 24 hr capsule [id number] Yes Take 1 capsule (75 mg total) by mouth daily with breakfast. Jerre Simon, MD Taking Active Self            Home Care and Equipment/Supplies: Were Home Health Services Ordered?: Yes Name of Home Health Agency:: unsure of name Has Agency set up a time to come to your home?: No Any new equipment or medical supplies ordered?: Yes (wheelchair) Were you able to get the equipment/medical supplies?: No (insurance wont pay for it) Do you have any questions related to the use of the equipment/supplies?: No  Functional Questionnaire: Do you need assistance with bathing/showering or dressing?: No Do you need assistance with meal preparation?: No Do you need assistance with eating?:  No Do you have difficulty maintaining continence: No Do you need assistance with getting out of bed/getting out of a chair/moving?: No Do you have difficulty managing or taking your medications?: No  Follow up appointments reviewed: PCP Follow-up appointment confirmed?: Yes Date of PCP follow-up appointment?: 07/11/22 Follow-up Provider: Dr Elliot Gurney [facility] Follow-up appointment confirmed?: Yes Date of Specialist follow-up appointment?: 07/02/22 Follow-Up Specialty Provider:: Dr Roda Shutters Do you need transportation to your follow-up appointment?: No Do you understand care options if your condition(s) worsen?: Yes-patient verbalized understanding    SIGNATURE  Woodfin Ganja LPN [facility] Nurse Health  Advisor Direct Dial [phone]

## 2022-07-02 ENCOUNTER — Ambulatory Visit: Payer: 59 | Admitting: Orthopaedic Surgery

## 2022-07-02 ENCOUNTER — Other Ambulatory Visit: Payer: Self-pay | Admitting: Student

## 2022-07-02 ENCOUNTER — Encounter: Payer: Self-pay | Admitting: Gynecology

## 2022-07-02 DIAGNOSIS — Z1231 Encounter for screening mammogram for malignant neoplasm of breast: Secondary | ICD-10-CM

## 2022-07-04 DIAGNOSIS — E785 Hyperlipidemia, unspecified: Secondary | ICD-10-CM | POA: Diagnosis not present

## 2022-07-04 DIAGNOSIS — N189 Chronic kidney disease, unspecified: Secondary | ICD-10-CM | POA: Diagnosis not present

## 2022-07-04 DIAGNOSIS — M549 Dorsalgia, unspecified: Secondary | ICD-10-CM | POA: Diagnosis not present

## 2022-07-04 DIAGNOSIS — J302 Other seasonal allergic rhinitis: Secondary | ICD-10-CM | POA: Diagnosis not present

## 2022-07-04 DIAGNOSIS — K219 Gastro-esophageal reflux disease without esophagitis: Secondary | ICD-10-CM | POA: Diagnosis not present

## 2022-07-04 DIAGNOSIS — J449 Chronic obstructive pulmonary disease, unspecified: Secondary | ICD-10-CM | POA: Diagnosis not present

## 2022-07-04 DIAGNOSIS — M542 Cervicalgia: Secondary | ICD-10-CM | POA: Diagnosis not present

## 2022-07-04 DIAGNOSIS — Z7984 Long term (current) use of oral hypoglycemic drugs: Secondary | ICD-10-CM | POA: Diagnosis not present

## 2022-07-04 DIAGNOSIS — K5900 Constipation, unspecified: Secondary | ICD-10-CM | POA: Diagnosis not present

## 2022-07-04 DIAGNOSIS — M1711 Unilateral primary osteoarthritis, right knee: Secondary | ICD-10-CM | POA: Diagnosis not present

## 2022-07-04 DIAGNOSIS — I5033 Acute on chronic diastolic (congestive) heart failure: Secondary | ICD-10-CM | POA: Diagnosis not present

## 2022-07-04 DIAGNOSIS — Z9181 History of falling: Secondary | ICD-10-CM | POA: Diagnosis not present

## 2022-07-04 DIAGNOSIS — E1122 Type 2 diabetes mellitus with diabetic chronic kidney disease: Secondary | ICD-10-CM | POA: Diagnosis not present

## 2022-07-04 DIAGNOSIS — Z7951 Long term (current) use of inhaled steroids: Secondary | ICD-10-CM | POA: Diagnosis not present

## 2022-07-04 DIAGNOSIS — G4733 Obstructive sleep apnea (adult) (pediatric): Secondary | ICD-10-CM | POA: Diagnosis not present

## 2022-07-04 DIAGNOSIS — F32A Depression, unspecified: Secondary | ICD-10-CM | POA: Diagnosis not present

## 2022-07-04 DIAGNOSIS — E1140 Type 2 diabetes mellitus with diabetic neuropathy, unspecified: Secondary | ICD-10-CM | POA: Diagnosis not present

## 2022-07-04 DIAGNOSIS — G8929 Other chronic pain: Secondary | ICD-10-CM | POA: Diagnosis not present

## 2022-07-04 DIAGNOSIS — I110 Hypertensive heart disease with heart failure: Secondary | ICD-10-CM | POA: Diagnosis not present

## 2022-07-05 ENCOUNTER — Telehealth: Payer: Self-pay | Admitting: *Deleted

## 2022-07-05 DIAGNOSIS — Z7984 Long term (current) use of oral hypoglycemic drugs: Secondary | ICD-10-CM | POA: Diagnosis not present

## 2022-07-05 DIAGNOSIS — Z7951 Long term (current) use of inhaled steroids: Secondary | ICD-10-CM | POA: Diagnosis not present

## 2022-07-05 DIAGNOSIS — F32A Depression, unspecified: Secondary | ICD-10-CM | POA: Diagnosis not present

## 2022-07-05 DIAGNOSIS — M549 Dorsalgia, unspecified: Secondary | ICD-10-CM | POA: Diagnosis not present

## 2022-07-05 DIAGNOSIS — M1711 Unilateral primary osteoarthritis, right knee: Secondary | ICD-10-CM | POA: Diagnosis not present

## 2022-07-05 DIAGNOSIS — E1122 Type 2 diabetes mellitus with diabetic chronic kidney disease: Secondary | ICD-10-CM | POA: Diagnosis not present

## 2022-07-05 DIAGNOSIS — G4733 Obstructive sleep apnea (adult) (pediatric): Secondary | ICD-10-CM | POA: Diagnosis not present

## 2022-07-05 DIAGNOSIS — J302 Other seasonal allergic rhinitis: Secondary | ICD-10-CM | POA: Diagnosis not present

## 2022-07-05 DIAGNOSIS — E1140 Type 2 diabetes mellitus with diabetic neuropathy, unspecified: Secondary | ICD-10-CM | POA: Diagnosis not present

## 2022-07-05 DIAGNOSIS — J449 Chronic obstructive pulmonary disease, unspecified: Secondary | ICD-10-CM | POA: Diagnosis not present

## 2022-07-05 DIAGNOSIS — M542 Cervicalgia: Secondary | ICD-10-CM | POA: Diagnosis not present

## 2022-07-05 DIAGNOSIS — I110 Hypertensive heart disease with heart failure: Secondary | ICD-10-CM | POA: Diagnosis not present

## 2022-07-05 DIAGNOSIS — K5900 Constipation, unspecified: Secondary | ICD-10-CM | POA: Diagnosis not present

## 2022-07-05 DIAGNOSIS — G8929 Other chronic pain: Secondary | ICD-10-CM | POA: Diagnosis not present

## 2022-07-05 DIAGNOSIS — K219 Gastro-esophageal reflux disease without esophagitis: Secondary | ICD-10-CM | POA: Diagnosis not present

## 2022-07-05 DIAGNOSIS — N189 Chronic kidney disease, unspecified: Secondary | ICD-10-CM | POA: Diagnosis not present

## 2022-07-05 DIAGNOSIS — E785 Hyperlipidemia, unspecified: Secondary | ICD-10-CM | POA: Diagnosis not present

## 2022-07-05 DIAGNOSIS — I5033 Acute on chronic diastolic (congestive) heart failure: Secondary | ICD-10-CM | POA: Diagnosis not present

## 2022-07-05 DIAGNOSIS — Z9181 History of falling: Secondary | ICD-10-CM | POA: Diagnosis not present

## 2022-07-05 NOTE — Telephone Encounter (Signed)
Patient called and states that she would like to be sent back to [facility] for rehab for her knee.  She was released on Sunday June 3 and has been home since.  She states that she is still having trouble with her knee giving out and that upon discharge her plan to SNF was 14 days.  Please check with CCM on how to proceed and call patient with updates.  Thanks [employer]

## 2022-07-08 NOTE — Telephone Encounter (Signed)
Patient returns call to nurse line regarding this request.   Please advise.   Pecola Haxton C Brytani Voth, RN  

## 2022-07-09 ENCOUNTER — Encounter: Payer: Self-pay | Admitting: Orthopaedic Surgery

## 2022-07-09 ENCOUNTER — Ambulatory Visit (INDEPENDENT_AMBULATORY_CARE_PROVIDER_SITE_OTHER): Payer: 59 | Admitting: Orthopaedic Surgery

## 2022-07-09 DIAGNOSIS — M1711 Unilateral primary osteoarthritis, right knee: Secondary | ICD-10-CM

## 2022-07-09 DIAGNOSIS — M25561 Pain in right knee: Secondary | ICD-10-CM

## 2022-07-09 MED ORDER — BUPIVACAINE HCL 0.5 % IJ SOLN
2.0000 mL | INTRAMUSCULAR | Status: AC | PRN
Start: 2022-07-09 — End: 2022-07-09
  Administered 2022-07-09: 2 mL via INTRA_ARTICULAR

## 2022-07-09 MED ORDER — LIDOCAINE HCL 1 % IJ SOLN
2.0000 mL | INTRAMUSCULAR | Status: AC | PRN
Start: 2022-07-09 — End: 2022-07-09
  Administered 2022-07-09: 2 mL

## 2022-07-09 MED ORDER — METHYLPREDNISOLONE ACETATE 40 MG/ML IJ SUSP
40.0000 mg | INTRAMUSCULAR | Status: AC | PRN
Start: 2022-07-09 — End: 2022-07-09
  Administered 2022-07-09: 40 mg via INTRA_ARTICULAR

## 2022-07-09 NOTE — Progress Notes (Signed)
Office Visit Note   Patient: [name]           Date of Birth: 11-28-1965           MRN: [id number] Visit Date: 07/09/2022              Requested by: Jerre Simon, MD [address] [place],  [place] [address] PCP: Jerre Simon, MD   Assessment & Plan: Visit Diagnoses:  1. Primary osteoarthritis of right knee     Plan: Impression is right knee osteoarthritis flare.  Cortisone injection performed today.  Hopefully this will help with her symptoms of giving way.  Patient is interested in seeing Dr. Christell Constant for her back and does not want to see Dr. Dutch Quint again.  Patient also feels that she is unsafe to be at home and needs to be at a skilled nursing facility therefore I recommended that she go to the ER for this.  Follow-Up Instructions: No follow-ups on file.   Orders:  No orders of the defined types were placed in this encounter.  No orders of the defined types were placed in this encounter.     Procedures: Large Joint Inj: R knee on 07/09/2022 3:51 PM Indications: pain Details: 22 G needle  Arthrogram: No  Medications: 40 mg methylPREDNISolone acetate 40 MG/ML; 2 mL lidocaine 1 %; 2 mL bupivacaine 0.5 % Consent was given by the patient. Patient was prepped and draped in the usual sterile fashion.       Clinical Data: No additional findings.   Subjective: Chief Complaint  Patient presents with   Lower Back - Pain   Right Knee - Pain    HPI Patient is a 57 year old female who comes in for evaluation of right knee pain.  She was recently admitted to the hospital after a fall at home.  She went to a skilled nursing facility but was discharged.  She has chronic low back pain and she is having a lot of trouble ambulating.  She uses [employer] film for chronic pain.  Dr. Dutch Quint performed a lumbar spine fusion in 2023. Review of Systems  Constitutional: Negative.   HENT: Negative.    Eyes: Negative.   Respiratory: Negative.    Cardiovascular: Negative.    Endocrine: Negative.   Musculoskeletal: Negative.   Neurological: Negative.   Hematological: Negative.   Psychiatric/Behavioral: Negative.    All other systems reviewed and are negative.    Objective: Vital Signs: LMP 01/29/1992   Physical Exam Vitals and nursing note reviewed.  Constitutional:      Appearance: She is well-developed.  HENT:     Head: Atraumatic.     Nose: Nose normal.  Eyes:     Extraocular Movements: Extraocular movements intact.  Cardiovascular:     Pulses: Normal pulses.  Pulmonary:     Effort: Pulmonary effort is normal.  Abdominal:     Palpations: Abdomen is soft.  Musculoskeletal:     Cervical back: Neck supple.  Skin:    General: Skin is warm.     Capillary Refill: Capillary refill takes less than 2 seconds.  Neurological:     Mental Status: She is alert. Mental status is at baseline.  Psychiatric:        Behavior: Behavior normal.        Thought Content: Thought content normal.        Judgment: Judgment normal.     Ortho Exam Examination of right knee shows no joint effusion.  Collaterals and cruciates  are stable.  Lateral joint line tenderness.  No focal findings. Specialty Comments:  No specialty comments available.  Imaging: No results found.   PMFS History: Patient Active Problem List   Diagnosis Date Noted   Acute on chronic heart failure with preserved ejection fraction (HFpEF) (HCC) 06/09/2022   Acute exacerbation of CHF (congestive heart failure) (HCC) 06/09/2022   Panniculitis 12/04/2021   Infection of lip 08/27/2021   Lumbar stenosis with neurogenic claudication 06/29/2021   Lumbar radiculopathy 06/29/2021   Biceps tendinopathy of right upper extremity 05/03/2021   Irregular heartbeat 04/11/2021   Neuropathic pain of foot 03/28/2021   Chronic congestion of paranasal sinus 03/28/2021   Impingement syndrome of right shoulder 03/08/2021   Arthritis of right acromioclavicular joint 03/08/2021   Acute medial meniscus  tear of right knee 03/08/2021   Acute lateral meniscus tear of right knee 03/08/2021   Spinal stenosis of thoracic region with radiculopathy    Bilateral leg numbness    Spinal stenosis of lumbar region with neurogenic claudication    Primary osteoarthritis of right knee    Impaired ambulation 01/10/2021   Status post lumbar laminectomy 10/03/2020   Cubital tunnel syndrome on left 05/02/2020   Vaginal discharge 01/20/2020   Prolapse of anterior vaginal wall 11/10/2019   Skin ulcer of left thigh, limited to breakdown of skin (HCC) 09/09/2019   Ulnar nerve compression 08/06/2019   Diabetic neuropathy associated with type 2 diabetes mellitus (HCC) 08/06/2019   Body mass index 50.0-59.9, adult (HCC) 06/12/2019   Impingement syndrome of left shoulder 05/07/2019   Polyp of colon    Polyp of ascending colon    Rectal discomfort 02/12/2019   Osteoarthritis of right hip 02/12/2019   Nontraumatic tear of left supraspinatus tendon 12/30/2018   Gout 12/17/2018   Nontraumatic complete tear of right rotator cuff 12/08/2018   Rotator cuff tear, right 12/01/2018   Tobacco abuse 08/21/2018   Right groin pain 07/20/2018   Depressed mood 07/13/2018   Paroxysmal SVT (supraventricular tachycardia) 03/20/2018   Pure hypercholesterolemia 03/20/2018   Muscle cramping 02/25/2018   Nodule of upper lobe of right lung 02/13/2018   Falls, subsequent encounter 01/30/2018   Pain in left foot 11/04/2017   Essential hypertension 10/27/2017   Solitary pulmonary nodule 10/07/2017   Restrictive lung disease secondary to obesity 10/01/2017   Polycythemia, secondary 10/01/2017   Chronic viral hepatitis B without delta-agent (HCC) 07/08/2017   COPD (chronic obstructive pulmonary disease) with chronic bronchitis 06/27/2017   Chronic kidney disease (CKD), stage IV (severe) (HCC) 01/14/2017   Decubitus ulcer 12/13/2016   Type 2 diabetes mellitus with stage 3 chronic kidney disease, with long-term current use of  insulin (HCC) 12/12/2016   Urge incontinence of urine 12/05/2016   Trigger finger, left index finger 07/15/2016   Back pain 04/07/2014   Morbid obesity (HCC) 10/02/2012   Chronic diastolic CHF (congestive heart failure) (HCC) 04/07/2012   Skin lesion of scalp 03/15/2011   GERD 01/26/2010   Obstructive sleep apnea treated with BiPAP 02/03/2008   FIBROCYSTIC BREAST DISEASE 10/28/2006   Hyperlipidemia 03/27/2006   Obesity hypoventilation syndrome (HCC) 03/27/2006   Depression with anxiety 03/27/2006   Past Medical History:  Diagnosis Date   Alcoholism in remission (HCC)    11-26-2021  pt stated no alcohol few yrs ago   Asthma    Bipolar disorder (HCC)    Chronic diastolic CHF (congestive heart failure) (HCC) 2014   followed by dr b. Cristal Deer;   Chronic hypoxemic respiratory failure (HCC)  pulmonologist--- dr Tonia Brooms;   (11-26-2021 per pt was on supplemental oxygen until she 01/ 2022 did not need it,  does not check O2 sats at home anymore.   CKD (chronic kidney disease), stage III [facility])    nephrologist--- dr Ronalee Belts   COPD (chronic obstructive pulmonary disease) (HCC)    11-26-2021 pt trying to quit smoking ,  started age 45   Depression    Diabetes mellitus type 2, diet-controlled (HCC)    followed by pcp   (11-26-2021  per pt last A1c by pcp on 10-30-2021 7.8,  stated does not check blood sugar)   Dyspnea    Fibrocystic breast disease    GAD (generalized anxiety disorder)    GERD (gastroesophageal reflux disease)    Hepatitis B surface antigen positive 11/2017   pt stated asymptomatic ,  no treatment   History of cervical dysplasia 04/10/2011   '93- once dx.-got pregnant-no intervention, then postpartum, no dysplasia found   History of condyloma acuminatum 04/09/2011   Hyperlipidemia    Hypertension    Incomplete left bundle branch block (LBBB)    Lung nodule 11/2017   pulmonologist--- dr Tonia Brooms;  RUL and RLL   Muscle spasm of both lower legs    chronic , due to back  issues,   OA (osteoarthritis) 04/10/2011   hips, shoulders, back   OSA (obstructive sleep apnea) 2009   study on epic 09-18-2007 mild osa w/ hypoxmia ;   (11-26-2021 per pt last used cpap 01/ 2022 before bariatric surgery 04/ 2022 stated lost wt / and stopped needing supplemental oxygen , did not need anymore)   Polyneuropathy    due to back issues and DM   Polysubstance abuse (HCC)    11-26-2021  pt hx cocaine use stated none for yrs but ED visit 08-14-2021  positive UDS,  pt stated it was once only for depression none since   PSVT (paroxysmal supraventricular tachycardia)    followed by cardiology   Spinal stenosis, unspecified spinal region    Urge incontinence of urine    Wears dentures    upper    Family History  Problem Relation Age of Onset   Pulmonary embolism Mother    Stroke Father    Alcohol abuse Father    Pneumonia Father    Hypertension Father    Breast cancer Maternal Aunt    Hypertension Other    Diabetes Other        Aunts and cousins   Asthma Sister    Depression Sister    Arthritis Sister    Sickle cell anemia Son     Past Surgical History:  Procedure Laterality Date   ANAL FISTULECTOMY  04/17/2011   Procedure: FISTULECTOMY ANAL;  Surgeon: Wilmon Arms. Corliss Skains, MD;  Location: [facility];  Service: General;  Laterality: N/A;  Excision of Condyloma Gluteal Cleft    ANAL RECTAL MANOMETRY N/A 03/26/2019   Procedure: ANO RECTAL MANOMETRY;  Surgeon: Tressia Danas, MD;  Location: [facility];  Service: Gastroenterology;  Laterality: N/A;   BIOPSY  03/09/2019   Procedure: BIOPSY;  Surgeon: Tressia Danas, MD;  Location: [facility];  Service: Gastroenterology;;   BOTOX INJECTION N/A 12/29/2018   Procedure: BOTOX INJECTION(100 UNITS)  WITH CYSTOSCOPY;  Surgeon: Rene Paci, MD;  Location: [facility];  Service: Urology;  Laterality: N/A;   BOTOX INJECTION N/A 07/27/2019   Procedure: BOTOX INJECTION WITH CYSTOSCOPY;  Surgeon: Rene Paci,  MD;  Location: [facility];  Service: Urology;  Laterality: N/A;  ONLY NEEDS 30 MIN   BOTOX INJECTION N/A 05/02/2021   Procedure: BOTOX INJECTION WITH CYSTOSCOPY;  Surgeon: Rene Paci, MD;  Location: [facility];  Service: Urology;  Laterality: N/A;  ONLY NEEDS 30 MIN   BOTOX INJECTION N/A 11/28/2021   Procedure: BOTOX INJECTION WITH CYSTOSCOPY, 200 UNITS;  Surgeon: Rene Paci, MD;  Location: [facility];  Service: Urology;  Laterality: N/A;  ONLY NEEDS 30 MIN   BOTOX INJECTION N/A 05/15/2022   Procedure: BOTOX INJECTION 200 UNITS;  Surgeon: Rene Paci, MD;  Location[facility];  Service: Urology;  Laterality: N/A;   BREAST EXCISIONAL BIOPSY Right    x 3   BREAST EXCISIONAL BIOPSY Left    12-29-2000  and 11-30-2001 @[facility]  by dr Carolynne Edouard   CARPAL TUNNEL RELEASE Bilateral    right 08-30-2002 and left 07-04-2003  by dr Otelia Sergeant @ [facility]   CATARACT EXTRACTION W/ INTRAOCULAR LENS IMPLANT Bilateral 12/2020   CERVICAL FUSION  04/10/2011   x3- cervical fusion with plating and screws-Dr. Noel Gerold   COLONOSCOPY WITH PROPOFOL N/A 07/06/2015   Procedure: COLONOSCOPY WITH PROPOFOL;  Surgeon: Dorena Cookey, MD;  Location: [facility];  Service: Endoscopy;  Laterality: N/A;   COLONOSCOPY WITH PROPOFOL N/A 03/09/2019   Procedure: COLONOSCOPY WITH PROPOFOL;  Surgeon: Tressia Danas, MD;  Location: [facility];  Service: Gastroenterology;  Laterality: N/A;   CYSTOSCOPY N/A 05/15/2022   Procedure: CYSTOSCOPY;  Surgeon: Rene Paci, MD;  Location: [facility];  Service: Urology;  Laterality: N/A;   FRACTURE SURGERY     little toe left foot   I & D EXTREMITY Left 12/18/2016   Procedure: IRRIGATION AND DEBRIDEMENT LEFT FOOT, CLOSURE;  Surgeon: Tarry Kos, MD;  Location: [facility];  Service: Orthopedics;  Laterality: Left;   INCISION AND DRAINAGE ABSCESS Right 05/03/2002   @[facility]  by dr Carolynne Edouard;   right breast abscess    INJECTION KNEE Right 10/03/2020   Procedure: KNEE INJECTION;  Surgeon: Kerrin Champagne, MD;  Location[facility];  Service: Orthopedics;  Laterality: Right;   KNEE ARTHROSCOPY WITH MEDIAL MENISECTOMY Right 03/21/2021   Procedure: RIGHT KNEE ARTHROSCOPY WITH MEDIAL AND PARTIAL LATERAL MENISCECTOMY;  Surgeon: Tarry Kos, MD;  Location: [facility];  Service: Orthopedics;  Laterality: Right;   LAPAROSCOPIC GASTRIC SLEEVE RESECTION N/A 05/08/2020   Procedure: LAPAROSCOPIC GASTRIC SLEEVE RESECTION;  Surgeon: Sheliah Hatch De Blanch, MD;  Location: [facility];  Service: General;  Laterality: N/A;   LUMBAR DISC SURGERY  04/10/2011   x5-Lumbar fusion-retained hardware.(Dr. Otelia Sergeant)   LUMBAR LAMINECTOMY N/A 10/03/2020   Procedure: THORACOLUMBAR LAMINECTOMIES THORACIC ELEVEN-TWELVE, THORACIC TWELVE-LUMBAR ONE  AND LUMBAT ONE-TWO;  Surgeon: Kerrin Champagne, MD;  Location: [facility];  Service: Orthopedics;  Laterality: N/A;   LUMBAR PERCUTANEOUS PEDICLE SCREW 2 LEVEL  06/29/2021   Procedure: LUMBAR PERCUTANEOUS PEDICLE SCREW LUMBAR ONE-TWO;  Surgeon: Julio Sicks, MD;  Location: [facility];  Service: Neurosurgery;;   MULTIPLE TOOTH EXTRACTIONS     POLYPECTOMY  03/09/2019   Procedure: POLYPECTOMY;  Surgeon: Tressia Danas, MD;  Location: [facility];  Service: Gastroenterology;;   POSTERIOR LUMBAR FUSION  04/04/2005   @[facility]  by dr Otelia Sergeant;   L3-- L5   POSTERIOR LUMBAR FUSION  03/04/2006   @[facility]  dr Otelia Sergeant;   fusion L2--3 and redo L3--5   SHOULDER ARTHROSCOPY WITH ROTATOR CUFF REPAIR AND SUBACROMIAL DECOMPRESSION Left 05/07/2019   Procedure: LEFT SHOULDER ARTHROSCOPY WITH DEBRIDEMENT, DISTAL CLAVICLE EXCISION,  SUBACROMIAL DECOMPRESSION AND  POSSIBLE ROTATOR CUFF REPAIR;  Surgeon: Tarry Kos, MD;  Location: [facility] OR;  Service: Orthopedics;  Laterality: Left;   TEE WITHOUT CARDIOVERSION N/A 04/06/2012   Procedure: TRANSESOPHAGEAL ECHOCARDIOGRAM (TEE);  Surgeon: Chrystie Nose, MD;  Location[facility];  Service:  Cardiovascular;  Laterality: N/A;   TRIGGER FINGER RELEASE Right    middle finger   TRIGGER FINGER RELEASE Left 06/28/2016   Procedure: RELEASE TRIGGER FINGER LEFT 3RD FINGER;  Surgeon: Tarry Kos, MD;  Location: [facility];  Service: Orthopedics;  Laterality: Left;   TRIGGER FINGER RELEASE Right 06/12/2016   Procedure: RIGHT INDEX FINGER TRIGGER RELEASE;  Surgeon: Tarry Kos, MD;  Location: [facility];  Service: Orthopedics;  Laterality: Right;   TRIGGER FINGER RELEASE Right 01/19/2018   Procedure: RIGHT RING FINGER TRIGGER FINGER RELEASE;  Surgeon: Tarry Kos, MD;  Location: [facility];  Service: Orthopedics;  Laterality: Right;   TRIGGER FINGER RELEASE Left 05/07/2019   Procedure: RELEASE TRIGGER FINGER LEFT INDEX FINGER;  Surgeon: Tarry Kos, MD;  Location: [facility];  Service: Orthopedics;  Laterality: Left;   UPPER GI ENDOSCOPY N/A 05/08/2020   Procedure: UPPER GI ENDOSCOPY;  Surgeon: Sheliah Hatch, De Blanch, MD;  Location: [facility];  Service: General;  Laterality: N/A;   Social History   Occupational History   Occupation: disabled    Comment: Disabled  Tobacco Use   Smoking status: Every Day    Packs/day: 0.50    Years: 44.00    Additional pack years: 0.00    Total pack years: 22.00    Types: Cigarettes    Start date: 01/28/1977   Smokeless tobacco: Never  Vaping Use   Vaping Use: Never used  Substance and Sexual Activity   Alcohol use: Not Currently    Comment: hx alcoholism,  (11-26-2021 per pt no alcohol in a while)   Drug use: Not Currently    Comment: 11-26-2021  (hx cocaine use) positive UDS in epic 08-14-2021  pt stated only did it once due to depression 07/ 2023 none since;  per pt last marijuana yrs ago   Sexual activity: Not Currently    Partners: Male    Birth control/protection: Post-menopausal

## 2022-07-10 ENCOUNTER — Telehealth: Payer: Self-pay

## 2022-07-10 ENCOUNTER — Encounter (HOSPITAL_COMMUNITY): Payer: Self-pay | Admitting: Internal Medicine

## 2022-07-10 ENCOUNTER — Other Ambulatory Visit: Payer: Self-pay

## 2022-07-10 ENCOUNTER — Observation Stay (HOSPITAL_COMMUNITY): Payer: 59

## 2022-07-10 ENCOUNTER — Inpatient Hospital Stay (HOSPITAL_COMMUNITY)
Admission: EM | Admit: 2022-07-10 | Discharge: 2022-07-14 | DRG: 291 | Disposition: A | Discharge status: Skilled Nursing Facility | Payer: 59 | Attending: Internal Medicine | Admitting: Internal Medicine

## 2022-07-10 ENCOUNTER — Emergency Department (HOSPITAL_COMMUNITY): Payer: 59

## 2022-07-10 DIAGNOSIS — M48061 Spinal stenosis, lumbar region without neurogenic claudication: Secondary | ICD-10-CM | POA: Diagnosis not present

## 2022-07-10 DIAGNOSIS — Z79899 Other long term (current) drug therapy: Secondary | ICD-10-CM | POA: Diagnosis not present

## 2022-07-10 DIAGNOSIS — M549 Dorsalgia, unspecified: Secondary | ICD-10-CM | POA: Diagnosis present

## 2022-07-10 DIAGNOSIS — Z811 Family history of alcohol abuse and dependence: Secondary | ICD-10-CM

## 2022-07-10 DIAGNOSIS — Z8261 Family history of arthritis: Secondary | ICD-10-CM

## 2022-07-10 DIAGNOSIS — E785 Hyperlipidemia, unspecified: Secondary | ICD-10-CM | POA: Diagnosis present

## 2022-07-10 DIAGNOSIS — F1193 Opioid use, unspecified with withdrawal: Secondary | ICD-10-CM | POA: Diagnosis not present

## 2022-07-10 DIAGNOSIS — G8929 Other chronic pain: Secondary | ICD-10-CM | POA: Diagnosis present

## 2022-07-10 DIAGNOSIS — Z7409 Other reduced mobility: Secondary | ICD-10-CM | POA: Diagnosis present

## 2022-07-10 DIAGNOSIS — N1831 Chronic kidney disease, stage 3a: Secondary | ICD-10-CM | POA: Diagnosis present

## 2022-07-10 DIAGNOSIS — Z9981 Dependence on supplemental oxygen: Secondary | ICD-10-CM | POA: Diagnosis not present

## 2022-07-10 DIAGNOSIS — Z803 Family history of malignant neoplasm of breast: Secondary | ICD-10-CM

## 2022-07-10 DIAGNOSIS — F411 Generalized anxiety disorder: Secondary | ICD-10-CM | POA: Diagnosis present

## 2022-07-10 DIAGNOSIS — Z720 Tobacco use: Secondary | ICD-10-CM | POA: Diagnosis present

## 2022-07-10 DIAGNOSIS — Z9181 History of falling: Secondary | ICD-10-CM

## 2022-07-10 DIAGNOSIS — I5033 Acute on chronic diastolic (congestive) heart failure: Secondary | ICD-10-CM | POA: Diagnosis present

## 2022-07-10 DIAGNOSIS — Z823 Family history of stroke: Secondary | ICD-10-CM

## 2022-07-10 DIAGNOSIS — Z743 Need for continuous supervision: Secondary | ICD-10-CM | POA: Diagnosis not present

## 2022-07-10 DIAGNOSIS — Z981 Arthrodesis status: Secondary | ICD-10-CM

## 2022-07-10 DIAGNOSIS — Z825 Family history of asthma and other chronic lower respiratory diseases: Secondary | ICD-10-CM

## 2022-07-10 DIAGNOSIS — Z7401 Bed confinement status: Secondary | ICD-10-CM

## 2022-07-10 DIAGNOSIS — I130 Hypertensive heart and chronic kidney disease with heart failure and stage 1 through stage 4 chronic kidney disease, or unspecified chronic kidney disease: Secondary | ICD-10-CM | POA: Diagnosis not present

## 2022-07-10 DIAGNOSIS — R6889 Other general symptoms and signs: Secondary | ICD-10-CM | POA: Diagnosis not present

## 2022-07-10 DIAGNOSIS — Z9841 Cataract extraction status, right eye: Secondary | ICD-10-CM

## 2022-07-10 DIAGNOSIS — J9621 Acute and chronic respiratory failure with hypoxia: Secondary | ICD-10-CM | POA: Diagnosis present

## 2022-07-10 DIAGNOSIS — R062 Wheezing: Secondary | ICD-10-CM | POA: Diagnosis not present

## 2022-07-10 DIAGNOSIS — Z7984 Long term (current) use of oral hypoglycemic drugs: Secondary | ICD-10-CM

## 2022-07-10 DIAGNOSIS — Z818 Family history of other mental and behavioral disorders: Secondary | ICD-10-CM

## 2022-07-10 DIAGNOSIS — Z832 Family history of diseases of the blood and blood-forming organs and certain disorders involving the immune mechanism: Secondary | ICD-10-CM

## 2022-07-10 DIAGNOSIS — F1021 Alcohol dependence, in remission: Secondary | ICD-10-CM | POA: Diagnosis present

## 2022-07-10 DIAGNOSIS — Z9842 Cataract extraction status, left eye: Secondary | ICD-10-CM

## 2022-07-10 DIAGNOSIS — J4489 Other specified chronic obstructive pulmonary disease: Secondary | ICD-10-CM | POA: Diagnosis present

## 2022-07-10 DIAGNOSIS — E1142 Type 2 diabetes mellitus with diabetic polyneuropathy: Secondary | ICD-10-CM | POA: Diagnosis not present

## 2022-07-10 DIAGNOSIS — I10 Essential (primary) hypertension: Secondary | ICD-10-CM | POA: Diagnosis present

## 2022-07-10 DIAGNOSIS — M545 Low back pain, unspecified: Secondary | ICD-10-CM | POA: Diagnosis not present

## 2022-07-10 DIAGNOSIS — Z885 Allergy status to narcotic agent status: Secondary | ICD-10-CM

## 2022-07-10 DIAGNOSIS — R079 Chest pain, unspecified: Secondary | ICD-10-CM | POA: Diagnosis not present

## 2022-07-10 DIAGNOSIS — I4719 Other supraventricular tachycardia: Secondary | ICD-10-CM | POA: Diagnosis not present

## 2022-07-10 DIAGNOSIS — E1122 Type 2 diabetes mellitus with diabetic chronic kidney disease: Secondary | ICD-10-CM | POA: Diagnosis present

## 2022-07-10 DIAGNOSIS — R4182 Altered mental status, unspecified: Secondary | ICD-10-CM | POA: Diagnosis not present

## 2022-07-10 DIAGNOSIS — Z6837 Body mass index (BMI) 37.0-37.9, adult: Secondary | ICD-10-CM

## 2022-07-10 DIAGNOSIS — Z87410 Personal history of cervical dysplasia: Secondary | ICD-10-CM

## 2022-07-10 DIAGNOSIS — R0902 Hypoxemia: Secondary | ICD-10-CM | POA: Diagnosis not present

## 2022-07-10 DIAGNOSIS — G4733 Obstructive sleep apnea (adult) (pediatric): Secondary | ICD-10-CM | POA: Diagnosis present

## 2022-07-10 DIAGNOSIS — F17210 Nicotine dependence, cigarettes, uncomplicated: Secondary | ICD-10-CM | POA: Diagnosis not present

## 2022-07-10 DIAGNOSIS — F319 Bipolar disorder, unspecified: Secondary | ICD-10-CM | POA: Diagnosis present

## 2022-07-10 DIAGNOSIS — I509 Heart failure, unspecified: Secondary | ICD-10-CM

## 2022-07-10 DIAGNOSIS — M5441 Lumbago with sciatica, right side: Secondary | ICD-10-CM | POA: Diagnosis not present

## 2022-07-10 DIAGNOSIS — F419 Anxiety disorder, unspecified: Secondary | ICD-10-CM | POA: Diagnosis present

## 2022-07-10 DIAGNOSIS — Z881 Allergy status to other antibiotic agents status: Secondary | ICD-10-CM

## 2022-07-10 DIAGNOSIS — E1140 Type 2 diabetes mellitus with diabetic neuropathy, unspecified: Secondary | ICD-10-CM | POA: Diagnosis present

## 2022-07-10 DIAGNOSIS — Z8249 Family history of ischemic heart disease and other diseases of the circulatory system: Secondary | ICD-10-CM | POA: Diagnosis not present

## 2022-07-10 DIAGNOSIS — Z961 Presence of intraocular lens: Secondary | ICD-10-CM | POA: Diagnosis present

## 2022-07-10 DIAGNOSIS — Z833 Family history of diabetes mellitus: Secondary | ICD-10-CM

## 2022-07-10 DIAGNOSIS — Z9884 Bariatric surgery status: Secondary | ICD-10-CM

## 2022-07-10 DIAGNOSIS — E876 Hypokalemia: Secondary | ICD-10-CM | POA: Diagnosis present

## 2022-07-10 DIAGNOSIS — N183 Chronic kidney disease, stage 3 unspecified: Secondary | ICD-10-CM

## 2022-07-10 DIAGNOSIS — R609 Edema, unspecified: Secondary | ICD-10-CM | POA: Diagnosis not present

## 2022-07-10 DIAGNOSIS — Z886 Allergy status to analgesic agent status: Secondary | ICD-10-CM

## 2022-07-10 DIAGNOSIS — F418 Other specified anxiety disorders: Secondary | ICD-10-CM | POA: Diagnosis present

## 2022-07-10 LAB — TROPONIN I (HIGH SENSITIVITY): Troponin I (High Sensitivity): 2 ng/L (ref <18)

## 2022-07-10 LAB — CBC WITH DIFFERENTIAL/PLATELET
Abs Immature Granulocytes: 0.07 10*3/uL (ref 0.00–0.07)
Basophils Absolute: 0 10*3/uL (ref 0.0–0.1)
Basophils Relative: 1 %
Eosinophils Absolute: 0 10*3/uL (ref 0.0–0.5)
Eosinophils Relative: 0 %
HCT: 50.2 % — ABNORMAL HIGH (ref 36.0–46.0)
Hemoglobin: 17.1 g/dL — ABNORMAL HIGH (ref 12.0–15.0)
Immature Granulocytes: 1 %
Lymphocytes Relative: 30 %
Lymphs Abs: 2.6 10*3/uL (ref 0.7–4.0)
MCH: 25.1 pg — ABNORMAL LOW (ref 26.0–34.0)
MCHC: 34.1 g/dL (ref 30.0–36.0)
MCV: 73.7 fL — ABNORMAL LOW (ref 80.0–100.0)
Monocytes Absolute: 0.4 10*3/uL (ref 0.1–1.0)
Monocytes Relative: 5 %
Neutro Abs: 5.4 10*3/uL (ref 1.7–7.7)
Neutrophils Relative %: 63 %
Platelets: 333 10*3/uL (ref 150–400)
RBC: 6.81 MIL/uL — ABNORMAL HIGH (ref 3.87–5.11)
RDW: 21.4 % — ABNORMAL HIGH (ref 11.5–15.5)
WBC: 8.5 10*3/uL (ref 4.0–10.5)
nRBC: 1.2 % — ABNORMAL HIGH (ref 0.0–0.2)

## 2022-07-10 LAB — BASIC METABOLIC PANEL
Anion gap: 12 (ref 5–15)
BUN: 25 mg/dL — ABNORMAL HIGH (ref 6–20)
CO2: 25 mmol/L (ref 22–32)
Calcium: 8.8 mg/dL — ABNORMAL LOW (ref 8.9–10.3)
Chloride: 99 mmol/L (ref 98–111)
Creatinine, Ser: 1.3 mg/dL — ABNORMAL HIGH (ref 0.44–1.00)
GFR, Estimated: 48 mL/min — ABNORMAL LOW (ref >60)
Glucose, Bld: 159 mg/dL — ABNORMAL HIGH (ref 70–99)
Potassium: 3.9 mmol/L (ref 3.5–5.1)
Sodium: 136 mmol/L (ref 135–145)

## 2022-07-10 LAB — BRAIN NATRIURETIC PEPTIDE: B Natriuretic Peptide: 1367.6 pg/mL — ABNORMAL HIGH (ref 0.0–100.0)

## 2022-07-10 LAB — CBG MONITORING, ED: Glucose-Capillary: 236 mg/dL — ABNORMAL HIGH (ref 70–99)

## 2022-07-10 MED ORDER — ACETAMINOPHEN 500 MG PO TABS
1000.0000 mg | ORAL_TABLET | Freq: Once | ORAL | Status: AC
  Administered 2022-07-10: 1000 mg via ORAL
  Filled 2022-07-10: qty 2

## 2022-07-10 MED ORDER — ENOXAPARIN SODIUM 40 MG/0.4ML IJ SOSY
40.0000 mg | PREFILLED_SYRINGE | INTRAMUSCULAR | Status: DC
  Administered 2022-07-10 – 2022-07-13 (×4): 40 mg via SUBCUTANEOUS
  Filled 2022-07-10 (×4): qty 0.4

## 2022-07-10 MED ORDER — ATORVASTATIN CALCIUM 40 MG PO TABS
40.0000 mg | ORAL_TABLET | Freq: Every day | ORAL | Status: DC
  Administered 2022-07-11 – 2022-07-14 (×4): 40 mg via ORAL
  Filled 2022-07-10 (×4): qty 1

## 2022-07-10 MED ORDER — UMECLIDINIUM BROMIDE 62.5 MCG/ACT IN AEPB
1.0000 | INHALATION_SPRAY | Freq: Every day | RESPIRATORY_TRACT | Status: DC
  Administered 2022-07-11 – 2022-07-14 (×4): 1 via RESPIRATORY_TRACT
  Filled 2022-07-10: qty 7

## 2022-07-10 MED ORDER — ONDANSETRON HCL 4 MG PO TABS: 4.0000 mg | ORAL_TABLET | Freq: Four times a day (QID) | ORAL | Status: DC | PRN

## 2022-07-10 MED ORDER — NYSTATIN 100000 UNIT/ML MT SUSP: 5.0000 mL | Freq: Four times a day (QID) | OROMUCOSAL | Status: DC | PRN

## 2022-07-10 MED ORDER — LORATADINE 10 MG PO TABS
10.0000 mg | ORAL_TABLET | Freq: Every day | ORAL | Status: DC
  Administered 2022-07-11 – 2022-07-14 (×4): 10 mg via ORAL
  Filled 2022-07-10 (×4): qty 1

## 2022-07-10 MED ORDER — FLUTICASONE FUROATE-VILANTEROL 100-25 MCG/ACT IN AEPB
1.0000 | INHALATION_SPRAY | Freq: Every day | RESPIRATORY_TRACT | Status: DC
  Administered 2022-07-11 – 2022-07-14 (×4): 1 via RESPIRATORY_TRACT
  Filled 2022-07-10: qty 28

## 2022-07-10 MED ORDER — OXYCODONE HCL 5 MG PO TABS
5.0000 mg | ORAL_TABLET | ORAL | Status: DC | PRN
  Administered 2022-07-11 – 2022-07-12 (×3): 5 mg via ORAL
  Filled 2022-07-10 (×3): qty 1

## 2022-07-10 MED ORDER — BUSPIRONE HCL 10 MG PO TABS
10.0000 mg | ORAL_TABLET | Freq: Three times a day (TID) | ORAL | Status: DC
  Administered 2022-07-10 – 2022-07-14 (×11): 10 mg via ORAL
  Filled 2022-07-10 (×11): qty 1

## 2022-07-10 MED ORDER — HYDROMORPHONE HCL 1 MG/ML IJ SOLN
1.0000 mg | Freq: Once | INTRAMUSCULAR | Status: AC
  Administered 2022-07-10: 1 mg via INTRAVENOUS
  Filled 2022-07-10: qty 1

## 2022-07-10 MED ORDER — BUPRENORPHINE HCL 900 MCG BU FILM
1.0000 | ORAL_FILM | Freq: Two times a day (BID) | BUCCAL | Status: DC
  Administered 2022-07-12 – 2022-07-13 (×4): 1 via BUCCAL

## 2022-07-10 MED ORDER — LOSARTAN POTASSIUM 25 MG PO TABS
25.0000 mg | ORAL_TABLET | Freq: Every day | ORAL | Status: DC
  Administered 2022-07-10 – 2022-07-13 (×4): 25 mg via ORAL
  Filled 2022-07-10 (×4): qty 1

## 2022-07-10 MED ORDER — IPRATROPIUM-ALBUTEROL 0.5-2.5 (3) MG/3ML IN SOLN
3.0000 mL | RESPIRATORY_TRACT | Status: AC
  Administered 2022-07-10 (×3): 3 mL via RESPIRATORY_TRACT
  Filled 2022-07-10: qty 6
  Filled 2022-07-10: qty 3

## 2022-07-10 MED ORDER — FUROSEMIDE 10 MG/ML IJ SOLN
40.0000 mg | Freq: Two times a day (BID) | INTRAMUSCULAR | Status: DC
  Administered 2022-07-11 – 2022-07-12 (×3): 40 mg via INTRAVENOUS
  Filled 2022-07-10 (×4): qty 4

## 2022-07-10 MED ORDER — OXYCODONE HCL 5 MG PO TABS
5.0000 mg | ORAL_TABLET | Freq: Once | ORAL | Status: AC
  Administered 2022-07-10: 5 mg via ORAL
  Filled 2022-07-10: qty 1

## 2022-07-10 MED ORDER — PANTOPRAZOLE SODIUM 40 MG PO TBEC
40.0000 mg | DELAYED_RELEASE_TABLET | Freq: Every day | ORAL | Status: DC
  Administered 2022-07-11 – 2022-07-14 (×4): 40 mg via ORAL
  Filled 2022-07-10 (×4): qty 1

## 2022-07-10 MED ORDER — GABAPENTIN 300 MG PO CAPS
900.0000 mg | ORAL_CAPSULE | Freq: Every day | ORAL | Status: DC
  Administered 2022-07-10 – 2022-07-13 (×4): 900 mg via ORAL
  Filled 2022-07-10 (×4): qty 3

## 2022-07-10 MED ORDER — POTASSIUM CHLORIDE CRYS ER 20 MEQ PO TBCR
20.0000 meq | EXTENDED_RELEASE_TABLET | Freq: Every day | ORAL | Status: DC
  Administered 2022-07-11 – 2022-07-12 (×2): 20 meq via ORAL
  Filled 2022-07-10 (×2): qty 1

## 2022-07-10 MED ORDER — INSULIN ASPART 100 UNIT/ML IJ SOLN
0.0000 [IU] | Freq: Three times a day (TID) | INTRAMUSCULAR | Status: DC
  Administered 2022-07-10: 7 [IU] via SUBCUTANEOUS
  Administered 2022-07-11: 3 [IU] via SUBCUTANEOUS
  Administered 2022-07-11 (×2): 4 [IU] via SUBCUTANEOUS
  Administered 2022-07-12: 3 [IU] via SUBCUTANEOUS
  Filled 2022-07-10: qty 0.2

## 2022-07-10 MED ORDER — GABAPENTIN 300 MG PO CAPS
300.0000 mg | ORAL_CAPSULE | Freq: Two times a day (BID) | ORAL | Status: DC
  Administered 2022-07-11 – 2022-07-14 (×6): 300 mg via ORAL
  Filled 2022-07-10 (×5): qty 1

## 2022-07-10 MED ORDER — EMPAGLIFLOZIN 10 MG PO TABS
10.0000 mg | ORAL_TABLET | Freq: Every day | ORAL | Status: DC
  Administered 2022-07-11 – 2022-07-14 (×4): 10 mg via ORAL
  Filled 2022-07-10 (×5): qty 1

## 2022-07-10 MED ORDER — ONDANSETRON HCL 4 MG/2ML IJ SOLN
4.0000 mg | Freq: Once | INTRAMUSCULAR | Status: AC
  Administered 2022-07-10: 4 mg via INTRAVENOUS
  Filled 2022-07-10: qty 2

## 2022-07-10 MED ORDER — GADOBUTROL 1 MMOL/ML IV SOLN
10.0000 mL | Freq: Once | INTRAVENOUS | Status: AC | PRN
  Administered 2022-07-10: 10 mL via INTRAVENOUS

## 2022-07-10 MED ORDER — IPRATROPIUM-ALBUTEROL 0.5-2.5 (3) MG/3ML IN SOLN
3.0000 mL | RESPIRATORY_TRACT | Status: DC | PRN
  Administered 2022-07-13: 3 mL via RESPIRATORY_TRACT
  Filled 2022-07-10: qty 3

## 2022-07-10 MED ORDER — BACLOFEN 10 MG PO TABS
10.0000 mg | ORAL_TABLET | Freq: Three times a day (TID) | ORAL | Status: DC
  Administered 2022-07-10 – 2022-07-14 (×11): 10 mg via ORAL
  Filled 2022-07-10 (×11): qty 1

## 2022-07-10 MED ORDER — LINACLOTIDE 72 MCG PO CAPS
72.0000 ug | ORAL_CAPSULE | Freq: Every day | ORAL | Status: DC
  Administered 2022-07-11 – 2022-07-14 (×4): 72 ug via ORAL
  Filled 2022-07-10 (×4): qty 1

## 2022-07-10 MED ORDER — ACETAMINOPHEN 650 MG RE SUPP: 650.0000 mg | Freq: Four times a day (QID) | RECTAL | Status: DC | PRN

## 2022-07-10 MED ORDER — FUROSEMIDE 10 MG/ML IJ SOLN
80.0000 mg | Freq: Once | INTRAMUSCULAR | Status: AC
  Administered 2022-07-10: 80 mg via INTRAVENOUS
  Filled 2022-07-10: qty 8

## 2022-07-10 MED ORDER — MONTELUKAST SODIUM 10 MG PO TABS
10.0000 mg | ORAL_TABLET | Freq: Every day | ORAL | Status: DC
  Administered 2022-07-10 – 2022-07-13 (×4): 10 mg via ORAL
  Filled 2022-07-10 (×4): qty 1

## 2022-07-10 MED ORDER — METHYLPREDNISOLONE SODIUM SUCC 125 MG IJ SOLR: 125.0000 mg | Freq: Once | INTRAMUSCULAR | Status: DC

## 2022-07-10 MED ORDER — ACETAMINOPHEN 325 MG PO TABS
650.0000 mg | ORAL_TABLET | Freq: Four times a day (QID) | ORAL | Status: DC | PRN
  Administered 2022-07-12: 650 mg via ORAL
  Filled 2022-07-10: qty 2

## 2022-07-10 MED ORDER — NALOXONE HCL 4 MG/0.1ML NA LIQD: 0.4000 mg | Freq: Once | NASAL | Status: DC | PRN

## 2022-07-10 MED ORDER — DILTIAZEM HCL ER COATED BEADS 120 MG PO CP24
120.0000 mg | ORAL_CAPSULE | Freq: Two times a day (BID) | ORAL | Status: DC
  Administered 2022-07-10 – 2022-07-14 (×8): 120 mg via ORAL
  Filled 2022-07-10 (×8): qty 1

## 2022-07-10 MED ORDER — ONDANSETRON HCL 4 MG/2ML IJ SOLN: 4.0000 mg | Freq: Four times a day (QID) | INTRAMUSCULAR | Status: DC | PRN

## 2022-07-10 MED ORDER — VENLAFAXINE HCL ER 75 MG PO CP24
75.0000 mg | ORAL_CAPSULE | Freq: Every day | ORAL | Status: DC
  Administered 2022-07-11 – 2022-07-14 (×4): 75 mg via ORAL
  Filled 2022-07-10 (×4): qty 1

## 2022-07-10 NOTE — ED Triage Notes (Signed)
Pt from home via EMS for failure to thrive like situation being stuck in bed for x2 weeks since returning from rehab for a fall. EMS concerned for pneumonia with RA O2 at 81. EMS provided albuterol, atrovent, and solumedrol. Sats improved during treatment. RA O2 in ED at 88.  Pt c/o pain in multiple locations. Denies CP, SOB.

## 2022-07-10 NOTE — Telephone Encounter (Signed)
Diannia Ruder [facility] RN calls nurse line requesting verbal orders for home health nursing as follows.   1x a week for 4 weeks.   Verbal order given.

## 2022-07-10 NOTE — Progress Notes (Signed)
Asked PT about nocturnal bipap, Pt refused stated she does not use hers at home.

## 2022-07-10 NOTE — H&P (Signed)
History and Physical    Patient: [name] DOB: 09-Oct-1965 DOA: 07/10/2022 DOS: the patient was seen and examined on 07/10/2022 PCP: Jerre Simon, MD  Patient coming from: Home  Chief Complaint:  Chief Complaint  Patient presents with   Shortness of Breath   Weakness   HPI: [name] is a 57 y.o. female with medical history significant of alcohol abuse in remission, asthma, bipolar disorder, chronic diastolic CHF, COPD, chronic hypoxemic respiratory failure, stage III CKD, type 2 diabetes, depression/GAD, fibrocystic breast disease, GERD, positive hep B surface antigen, condyloma acuminatum, hyperlipidemia, hypertension, incomplete LBBB, lung nodule, osteoarthritis OSA on CPAP, polyneuropathy, polysubstance abuse, PSVT, spinal stenosis associated with lower extremities spasms who was discharged from rehab 2 weeks ago after she spent about a week there due to physical deconditioning following a 5-day hospitalization from 06/09/2022 until 06/14/2022 in the setting of acute diastolic heart failure who is returning to the emergency department due to dyspnea with an O2 sat in the 80s when EMS arrived to her home and lower spine back pain for the past 2 weeks after having a fall.  She has been mostly in bed for the past 2 weeks.  No fecal or urinary incontinence.  She denied fever, chills, rhinorrhea, sore throat, wheezing or hemoptysis.  No chest pain, palpitations, diaphoresis, PND, but positive orthopnea and pitting edema of the lower extremities.  No abdominal pain, nausea, emesis, diarrhea, constipation, melena or hematochezia.  No flank pain, dysuria, frequency or hematuria.  No polyuria, polydipsia, polyphagia or blurred vision.   ED course: Initial vital signs were temperature 98.7 F, pulse 98, respiration 20, BP 132/81 mmHg O2 sat 95% on room air.  The patient received furosemide 80 mg IVP, DuoNeb, acetaminophen 1000 mg p.o. and oxycodone 5 mg p.o.  Lab work: Her CBC  showed a white count of 8.5, hemoglobin 17.1 g/dL with an MCV of 23.5 fL and platelets 333.  BMP showed a glucose of 159, BUN 25, creatinine 1.30 and calcium 8.8 mg/dL.  The rest of the electrolytes were normal.  BNP was 1367.6 pg/mL.  Imaging: Portable 1 view chest radiograph with cardiomegaly.  No confluent airspace opacity.  Lumbar spine x-ray showing status post L1-L5 posterior spinal fusion with intact hardware.  There is a sclerotic appearance of the L1 vertebral body with irregular endplate changes of the superior endplate of L1 and inferior endplate of T13.  Further evaluation with CT or contrast-enhanced lumbar spine MRI should be done to exclude infection.   Review of Systems: As mentioned in the history of present illness. All other systems reviewed and are negative. Past Medical History:  Diagnosis Date   Alcoholism in remission (HCC)    11-26-2021  pt stated no alcohol few yrs ago   Asthma    Bipolar disorder (HCC)    Chronic diastolic CHF (congestive heart failure) (HCC) 2014   followed by dr b. Cristal Deer;   Chronic hypoxemic respiratory failure [facility])    pulmonologist--- dr Tonia Brooms;   (11-26-2021 per pt was on supplemental oxygen until she 01/ 2022 did not need it,  does not check O2 sats at home anymore.   CKD (chronic kidney disease), stage III [facility])    nephrologist--- dr Ronalee Belts   COPD (chronic obstructive pulmonary disease) (HCC)    11-26-2021 pt trying to quit smoking ,  started age 13   Depression    Diabetes mellitus type 2, diet-controlled (HCC)    followed by pcp   (11-26-2021  per  pt last A1c by pcp on 10-30-2021 7.8,  stated does not check blood sugar)   Dyspnea    Fibrocystic breast disease    GAD (generalized anxiety disorder)    GERD (gastroesophageal reflux disease)    Hepatitis B surface antigen positive 11/2017   pt stated asymptomatic ,  no treatment   History of cervical dysplasia 04/10/2011   '93- once dx.-got pregnant-no intervention, then postpartum,  no dysplasia found   History of condyloma acuminatum 04/09/2011   Hyperlipidemia    Hypertension    Incomplete left bundle branch block (LBBB)    Lung nodule 11/2017   pulmonologist--- dr Tonia Brooms;  RUL and RLL   Muscle spasm of both lower legs    chronic , due to back issues,   OA (osteoarthritis) 04/10/2011   hips, shoulders, back   OSA (obstructive sleep apnea) 2009   study on epic 09-18-2007 mild osa w/ hypoxmia ;   (11-26-2021 per pt last used cpap 01/ 2022 before bariatric surgery 04/ 2022 stated lost wt / and stopped needing supplemental oxygen , did not need anymore)   Polyneuropathy    due to back issues and DM   Polysubstance abuse (HCC)    11-26-2021  pt hx cocaine use stated none for yrs but ED visit 08-14-2021  positive UDS,  pt stated it was once only for depression none since   PSVT (paroxysmal supraventricular tachycardia)    followed by cardiology   Spinal stenosis, unspecified spinal region    Urge incontinence of urine    Wears dentures    upper   Past Surgical History:  Procedure Laterality Date   ANAL FISTULECTOMY  04/17/2011   Procedure: FISTULECTOMY ANAL;  Surgeon: Wilmon Arms. Corliss Skains, MD;  Location: [facility];  Service: General;  Laterality: N/A;  Excision of Condyloma Gluteal Cleft    ANAL RECTAL MANOMETRY N/A 03/26/2019   Procedure: ANO RECTAL MANOMETRY;  Surgeon: Tressia Danas, MD;  Location: [facility];  Service: Gastroenterology;  Laterality: N/A;   BIOPSY  03/09/2019   Procedure: BIOPSY;  Surgeon: Tressia Danas, MD;  Location: [facility];  Service: Gastroenterology;;   BOTOX INJECTION N/A 12/29/2018   Procedure: BOTOX INJECTION(100 UNITS)  WITH CYSTOSCOPY;  Surgeon: Rene Paci, MD;  Location: [facility];  Service: Urology;  Laterality: N/A;   BOTOX INJECTION N/A 07/27/2019   Procedure: BOTOX INJECTION WITH CYSTOSCOPY;  Surgeon: Rene Paci, MD;  Location: [facility];  Service: Urology;  Laterality: N/A;  ONLY NEEDS 30 MIN    BOTOX INJECTION N/A 05/02/2021   Procedure: BOTOX INJECTION WITH CYSTOSCOPY;  Surgeon: Rene Paci, MD;  Location: [facility];  Service: Urology;  Laterality: N/A;  ONLY NEEDS 30 MIN   BOTOX INJECTION N/A 11/28/2021   Procedure: BOTOX INJECTION WITH CYSTOSCOPY, 200 UNITS;  Surgeon: Rene Paci, MD;  Location: [facility];  Service: Urology;  Laterality: N/A;  ONLY NEEDS 30 MIN   BOTOX INJECTION N/A 05/15/2022   Procedure: BOTOX INJECTION 200 UNITS;  Surgeon: Rene Paci, MD;  Location: [facility];  Service: Urology;  Laterality: N/A;   BREAST EXCISIONAL BIOPSY Right    x 3   BREAST EXCISIONAL BIOPSY Left    12-29-2000  and 11-30-2001 @[facility]  by dr Carolynne Edouard   CARPAL TUNNEL RELEASE Bilateral    right 08-30-2002 and left 07-04-2003  by dr Otelia Sergeant @ [facility]   CATARACT EXTRACTION W/ INTRAOCULAR LENS IMPLANT Bilateral 12/2020   CERVICAL FUSION  04/10/2011  x3- cervical fusion with plating and screws-Dr. Noel Gerold   COLONOSCOPY WITH PROPOFOL N/A 07/06/2015   Procedure: COLONOSCOPY WITH PROPOFOL;  Surgeon: Dorena Cookey, MD;  Location: [facility];  Service: Endoscopy;  Laterality: N/A;   COLONOSCOPY WITH PROPOFOL N/A 03/09/2019   Procedure: COLONOSCOPY WITH PROPOFOL;  Surgeon: Tressia Danas, MD;  Location: [facility];  Service: Gastroenterology;  Laterality: N/A;   CYSTOSCOPY N/A 05/15/2022   Procedure: CYSTOSCOPY;  Surgeon: Rene Paci, MD;  Location[facility];  Service: Urology;  Laterality: N/A;   FRACTURE SURGERY     little toe left foot   I & D EXTREMITY Left 12/18/2016   Procedure: IRRIGATION AND DEBRIDEMENT LEFT FOOT, CLOSURE;  Surgeon: Tarry Kos, MD;  Location: [facility];  Service: Orthopedics;  Laterality: Left;   INCISION AND DRAINAGE ABSCESS Right 05/03/2002   @[facility]  by dr Carolynne Edouard;   right breast abscess   INJECTION KNEE Right 10/03/2020   Procedure: KNEE INJECTION;  Surgeon: Kerrin Champagne, MD;  Location[facility];  Service: Orthopedics;  Laterality: Right;   KNEE ARTHROSCOPY WITH MEDIAL MENISECTOMY Right 03/21/2021   Procedure: RIGHT KNEE ARTHROSCOPY WITH MEDIAL AND PARTIAL LATERAL MENISCECTOMY;  Surgeon: Tarry Kos, MD;  Location: [facility];  Service: Orthopedics;  Laterality: Right;   LAPAROSCOPIC GASTRIC SLEEVE RESECTION N/A 05/08/2020   Procedure: LAPAROSCOPIC GASTRIC SLEEVE RESECTION;  Surgeon: Sheliah Hatch De Blanch, MD;  Location: [facility];  Service: General;  Laterality: N/A;   LUMBAR DISC SURGERY  04/10/2011   x5-Lumbar fusion-retained hardware.(Dr. Otelia Sergeant)   LUMBAR LAMINECTOMY N/A 10/03/2020   Procedure: THORACOLUMBAR LAMINECTOMIES THORACIC ELEVEN-TWELVE, THORACIC TWELVE-LUMBAR ONE  AND LUMBAT ONE-TWO;  Surgeon: Kerrin Champagne, MD;  Location: [facility];  Service: Orthopedics;  Laterality: N/A;   LUMBAR PERCUTANEOUS PEDICLE SCREW 2 LEVEL  06/29/2021   Procedure: LUMBAR PERCUTANEOUS PEDICLE SCREW LUMBAR ONE-TWO;  Surgeon: Julio Sicks, MD;  Location: [facility];  Service: Neurosurgery;;   MULTIPLE TOOTH EXTRACTIONS     POLYPECTOMY  03/09/2019   Procedure: POLYPECTOMY;  Surgeon: Tressia Danas, MD;  Location: [facility];  Service: Gastroenterology;;   POSTERIOR LUMBAR FUSION  04/04/2005   @[facility]  by dr Otelia Sergeant;   L3-- L5   POSTERIOR LUMBAR FUSION  03/04/2006   @[facility]  dr Otelia Sergeant;   fusion L2--3 and redo L3--5   SHOULDER ARTHROSCOPY WITH ROTATOR CUFF REPAIR AND SUBACROMIAL DECOMPRESSION Left 05/07/2019   Procedure: LEFT SHOULDER ARTHROSCOPY WITH DEBRIDEMENT, DISTAL CLAVICLE EXCISION,  SUBACROMIAL DECOMPRESSION AND POSSIBLE ROTATOR CUFF REPAIR;  Surgeon: Tarry Kos, MD;  Location: [facility];  Service: Orthopedics;  Laterality: Left;   TEE WITHOUT CARDIOVERSION N/A 04/06/2012   Procedure: TRANSESOPHAGEAL ECHOCARDIOGRAM (TEE);  Surgeon: Chrystie Nose, MD;  Location: [facility];  Service: Cardiovascular;  Laterality: N/A;   TRIGGER FINGER RELEASE Right    middle finger    TRIGGER FINGER RELEASE Left 06/28/2016   Procedure: RELEASE TRIGGER FINGER LEFT 3RD FINGER;  Surgeon: Tarry Kos, MD;  Location: [facility];  Service: Orthopedics;  Laterality: Left;   TRIGGER FINGER RELEASE Right 06/12/2016   Procedure: RIGHT INDEX FINGER TRIGGER RELEASE;  Surgeon: Tarry Kos, MD;  Location: [facility];  Service: Orthopedics;  Laterality: Right;   TRIGGER FINGER RELEASE Right 01/19/2018   Procedure: RIGHT RING FINGER TRIGGER FINGER RELEASE;  Surgeon: Tarry Kos, MD;  Location: [facility];  Service: Orthopedics;  Laterality: Right;   TRIGGER FINGER RELEASE Left 05/07/2019   Procedure: RELEASE TRIGGER FINGER LEFT INDEX FINGER;  Surgeon: Roda Shutters,  Edwin Cap, MD;  Location: [facility];  Service: Orthopedics;  Laterality: Left;   UPPER GI ENDOSCOPY N/A 05/08/2020   Procedure: UPPER GI ENDOSCOPY;  Surgeon: Sheliah Hatch, De Blanch, MD;  Location: [facility];  Service: General;  Laterality: N/A;   Social History:  reports that she has been smoking cigarettes. She started smoking about 45 years ago. She has a 22.00 pack-year smoking history. She has never used smokeless tobacco. She reports that she does not currently use alcohol. She reports that she does not currently use drugs.  Allergies  Allergen Reactions   Aspirin Other (See Comments)    Kidney disease   Methadone Hcl Other (See Comments)     "blacked out" in 1990's   Levofloxacin Itching    Family History  Problem Relation Age of Onset   Pulmonary embolism Mother    Stroke Father    Alcohol abuse Father    Pneumonia Father    Hypertension Father    Breast cancer Maternal Aunt    Hypertension Other    Diabetes Other        Aunts and cousins   Asthma Sister    Depression Sister    Arthritis Sister    Sickle cell anemia Son     Prior to Admission medications   Medication Sig Start Date End Date Taking? Authorizing Provider  ACCU-CHEK AVIVA PLUS test strip 1 EACH BY OTHER ROUTE 3 (THREE) TIMES DAILY. TEST 3 TIMES DAILY 07/26/21    Celedonio Savage, MD  acetaminophen (TYLENOL) 500 MG tablet Take 1,000 mg by mouth 2 (two) times daily.    [provider]  albuterol (VENTOLIN HFA) 108 (90 Base) MCG/ACT inhaler TAKE 2 PUFFS BY MOUTH EVERY 6 HOURS AS NEEDED FOR WHEEZE OR SHORTNESS OF BREATH Strength: 108 (90 Base) MCG/ACT Patient taking differently: Inhale 1-2 puffs into the lungs every 6 (six) hours as needed for wheezing or shortness of breath. TAKE 2 PUFFS BY MOUTH EVERY 6 HOURS AS NEEDED FOR WHEEZE OR SHORTNESS OF BREATH Strength: 108 (90 Base) MCG/ACT 08/28/21   Jerre Simon, MD  ammonium lactate (LAC-HYDRIN) 12 % lotion For dry skin Patient taking differently: Apply 1 Application topically 2 (two) times daily. For dry skin 08/29/21   Jerre Simon, MD  atorvastatin (LIPITOR) 40 MG tablet TAKE ONE TABLET BY MOUTH EVERY DAY Patient taking differently: Take 40 mg by mouth daily. 05/02/22   Jerre Simon, MD  baclofen (LIORESAL) 10 MG tablet Take 1 tablet (10 mg total) by mouth 3 (three) times daily. 06/14/22   Vonna Drafts, MD  BELBUCA 900 MCG FILM Place 1 Film inside cheek 2 (two) times daily. 03/11/22   [provider]  busPIRone (BUSPAR) 10 MG tablet TAKE ONE TABLET BY MOUTH THREE TIMES DAILY Patient taking differently: Take 10 mg by mouth 3 (three) times daily. 02/19/22   Jerre Simon, MD  calcium carbonate (OSCAL) 1500 (600 Ca) MG TABS tablet Take 1,500 mg by mouth 2 (two) times daily.    [provider]  diclofenac Sodium (VOLTAREN) 1 % GEL APPLY FOUR GRAMS FOUR TIMES DAILY AS DIRECTED 07/03/22   Jerre Simon, MD  diltiazem (CARDIZEM CD) 120 MG 24 hr capsule Take 1 capsule (120 mg total) by mouth 2 (two) times daily. 01/08/22   Jerre Simon, MD  empagliflozin (JARDIANCE) 10 MG TABS tablet Take 1 tablet (10 mg total) by mouth daily. 06/15/22   Vonna Drafts, MD  fluticasone (FLONASE) 50 MCG/ACT nasal spray INSTILL TWO SPRAYS INTO BOTH NOSTRILS TWICE  DAILY Patient taking differently: Place 2 sprays into  both nostrils 2 (two) times daily. INSTILL TWO SPRAYS INTO BOTH NOSTRILS TWICE DAILY 10/03/21   Jerre Simon, MD  furosemide (LASIX) 80 MG tablet Take 1 tablet (80 mg total) by mouth 2 (two) times daily. 06/14/22 06/14/23  Lincoln Brigham, MD  furosemide (LASIX) 80 MG tablet Take 1 tablet (80 mg total) by mouth 2 (two) times daily. 06/14/22   Vonna Drafts, MD  gabapentin (NEURONTIN) 300 MG capsule TAKE TWO CAPSULES BY MOUTH EVERY MORNING AND EVERY LUNCH AND TAKE THREE CAPSULES BY MOUTH EVERY EVENING 06/15/22   Jerre Simon, MD  ketoconazole (NIZORAL) 2 % cream Apply 1 Application topically daily. 11/21/21   Helane Gunther, DPM  linaclotide (LINZESS) 72 MCG capsule Take 72 mcg by mouth daily before breakfast.    [provider]  loratadine (CLARITIN) 10 MG tablet Take 1 tablet (10 mg total) by mouth daily. 08/28/21   Jerre Simon, MD  losartan (COZAAR) 25 MG tablet Take 1 tablet (25 mg total) by mouth at bedtime. Patient taking differently: Take 25 mg by mouth at bedtime. 12/04/21   Jerre Simon, MD  montelukast (SINGULAIR) 10 MG tablet TAKE 1 TABLET BY MOUTH EVERYDAY AT BEDTIME Strength: 10 mg Patient taking differently: Take 10 mg by mouth in the morning. 08/15/21   Jerre Simon, MD  Multiple Vitamins-Minerals (BARIATRIC MULTIVITAMINS/IRON) CAPS Take 1 capsule by mouth 2 (two) times daily.    [provider]  NARCAN 4 MG/0.1ML LIQD nasal spray kit Place 0.4 mg into the nose once as needed (accidental overdose). 02/16/18   [provider]  nicotine (NICODERM CQ - DOSED IN MG/24 HOURS) 21 mg/24hr patch Place 1 patch (21 mg total) onto the skin daily. 06/15/22   Vonna Drafts, MD  nystatin (MYCOSTATIN) 100000 UNIT/ML suspension Take 5 mLs (500,000 Units total) by mouth 4 (four) times daily. Swish and swallow. Patient taking differently: Take 5 mLs by mouth 4 (four) times daily as needed. Swish and swallow. 03/27/22   Jerre Simon, MD  omeprazole (PRILOSEC) 20 MG capsule TAKE ONE  CAPSULE BY MOUTH EVERY DAY 04/15/22   Jerre Simon, MD  potassium chloride SA (KLOR-CON M) 20 MEQ tablet Take 20 mEq by mouth daily. 08/13/21   [provider]  TRELEGY ELLIPTA 100-62.5-25 MCG/ACT AEPB Inhale 1 puff into the lungs daily. 03/01/22   Jerre Simon, MD  venlafaxine XR (EFFEXOR-XR) 75 MG 24 hr capsule Take 1 capsule (75 mg total) by mouth daily with breakfast. 02/19/22   Jerre Simon, MD    Physical Exam: Vitals:   07/10/22 1044 07/10/22 1100 07/10/22 1230 07/10/22 1300  BP: 132/81 133/83 (!) 143/92 129/76  Pulse: 98 95 (!) 101 100  Resp: 20 20 19  (!) 28  Temp: 98.7 F (37.1 C)     TempSrc: Oral     SpO2: 95% 93% 95% 95%   Physical Exam Vitals and nursing note reviewed.  Constitutional:      General: She is awake.     Appearance: She is well-developed. She is morbidly obese.  HENT:     Head: Normocephalic.  Eyes:     Pupils: Pupils are equal, round, and reactive to light.  Neck:     Vascular: No JVD.  Cardiovascular:     Rate and Rhythm: Normal rate and regular rhythm.     Heart sounds: S1 normal and S2 normal.  Pulmonary:     Effort: Pulmonary effort is normal.     Breath sounds: Examination  of the right-lower field reveals rales. Examination of the left-lower field reveals rales. Rales present. No decreased breath sounds, wheezing or rhonchi.  Abdominal:     Palpations: Abdomen is soft.  Musculoskeletal:     Cervical back: Neck supple.     Right lower leg: 1+ Pitting Edema present.     Left lower leg: 1+ Pitting Edema present.  Skin:    General: Skin is warm and dry.  Neurological:     General: No focal deficit present.     Mental Status: She is alert and oriented to person, place, and time.  Psychiatric:        Mood and Affect: Mood normal.        Behavior: Behavior normal. Behavior is cooperative.     Data Reviewed:  Results are pending, will review when available.  06/10/2022 transthoracic echocardiogram IMPRESSIONS    1. Extremely  limited; LV function appears to be normal on definity  images; color doppler suboptimal.   2. Left ventricular ejection fraction, by estimation, is 60 to 65%. The  left ventricle has normal function. The left ventricle has no regional  wall motion abnormalities. Left ventricular diastolic parameters are  indeterminate.   3. Right ventricular systolic function was not well visualized. The right  ventricular size is not well visualized.   4. The mitral valve was not well visualized. not well visualized mitral  valve regurgitation.   5. Tricuspid valve regurgitation not well visualized.   6. The aortic valve was not well visualized. Aortic valve regurgitation  not well visualized.   7. Pulmonic valve regurgitation not well visualized.   Assessment and Plan: Principal Problem:   Acute on chronic diastolic congestive heart failure (HCC) Observation/telemetry. Supplemental oxygen as needed. Sodium and fluid restriction. Continue furosemide 40 mg IVP twice daily. Continue losartan 25 mg p.o. daily. Monitor daily weights, intake and output. Monitor renal function and electrolytes. Replace electrolytes as needed.  Active Problems:   Back pain Continue baclofen 10 mg p.o. 3 times daily. Continue buprenorphine 1 film p.o. twice daily. Oxycodone 5 mg every 4 hours as needed for pain. Continue gabapentin 300 mg p.o. twice daily and 900 mg at bedtime.    Hyperlipidemia Continue atorvastatin 40 mg p.o. daily.    Depression with anxiety Continue buspirone 10 mg p.o. 3 times daily. Continue Effexor 75 mg p.o. daily.    Obstructive sleep apnea treated with BiPAP Continue BiPAP at bedtime.    Type 2 diabetes mellitus with stage 3 chronic kidney disease,  without long-term current use of insulin (HCC) HbA1c 6.6% last month. Carbohydrate modified diet. Continue Jardiance 10 mg p.o. daily. CBG monitoring with RI SS while in the hospital.    COPD (chronic obstructive pulmonary disease)  with chronic bronchitis Short acting beta agonist as needed. Continue loratadine 10 mg p.o. daily. Continue montelukast 10 mg p.o. daily. Smoking cessation advised.    Essential hypertension Continue losartan 25 mg p.o. daily.   Continue diltiazem CD1 120 mg p.o. twice daily. Monitor BP, HR, renal function and electrolytes.    Tobacco abuse Tobacco cessation advised. Nicotine replacement therapy order as needed.    Diabetic neuropathy associated with type 2 diabetes mellitus (HCC) Continue gabapentin as above.     Advance Care Planning:   Code Status: Full Code   Consults:   Family Communication:   Severity of Illness: The appropriate patient status for this patient is OBSERVATION. Observation status is judged to be reasonable and necessary in order to provide the  required intensity of service to ensure the patient's safety. The patient's presenting symptoms, physical exam findings, and initial radiographic and laboratory data in the context of their medical condition is felt to place them at decreased risk for further clinical deterioration. Furthermore, it is anticipated that the patient will be medically stable for discharge from the hospital within 2 midnights of admission.   Author: Bobette Mo, MD 07/10/2022 2:32 PM  For on call review [email or web address].   This document was prepared using Dragon voice recognition software and may contain some unintended transcription errors.

## 2022-07-10 NOTE — ED Provider Notes (Signed)
[facility] EMERGENCY DEPARTMENT AT [facility] Provider Note   CSN: [id number] Arrival date & time: 07/10/22  1029     History  Chief Complaint  Patient presents with   Shortness of Breath   Weakness    [name] is a 57 y.o. female.  57 yo F with a chief complaints of difficulty getting out of bed.  She tells me that she was in the hospital and was discharged to rehab and she does not think she was in rehab long enough.  Feels like her right leg is still very weak and she has trouble getting up out of bed and has trouble with mobilization.  Feels like her leg gives out from under her.  Since she has been home she has been in bed essentially the last 2 weeks.  She had developed worsening back pain from her baseline feels like it radiates to the left anterior leg.  She also has developed pain to both of her shoulders.  Seems to be worse with movement palpation twisting.  She saw her orthopedist in the office yesterday and he did a joint injection.  She has not noticed any obvious initial improvement.  Has been coughing quite a bit over the past few days.  She did not go home on oxygen.  Was noted to be hypoxic with EMS in the low 80s.   Shortness of Breath Weakness Associated symptoms: shortness of breath        Home Medications Prior to Admission medications   Medication Sig Start Date End Date Taking? Authorizing Provider  acetaminophen (TYLENOL) 500 MG tablet Take 1,000 mg by mouth 2 (two) times daily.   Yes [provider]  albuterol (VENTOLIN HFA) 108 (90 Base) MCG/ACT inhaler TAKE 2 PUFFS BY MOUTH EVERY 6 HOURS AS NEEDED FOR WHEEZE OR SHORTNESS OF BREATH Strength: 108 (90 Base) MCG/ACT Patient taking differently: Inhale 1-2 puffs into the lungs every 6 (six) hours as needed for wheezing or shortness of breath. TAKE 2 PUFFS BY MOUTH EVERY 6 HOURS AS NEEDED FOR WHEEZE OR SHORTNESS OF BREATH Strength: 108 (90 Base) MCG/ACT 08/28/21  Yes Jerre Simon, MD   ammonium lactate (LAC-HYDRIN) 12 % lotion For dry skin Patient taking differently: Apply 1 Application topically 2 (two) times daily. For dry skin 08/29/21  Yes Jerre Simon, MD  atorvastatin (LIPITOR) 40 MG tablet TAKE ONE TABLET BY MOUTH EVERY DAY Patient taking differently: Take 40 mg by mouth daily. 05/02/22  Yes Jerre Simon, MD  baclofen (LIORESAL) 10 MG tablet Take 1 tablet (10 mg total) by mouth 3 (three) times daily. 06/14/22  Yes Vonna Drafts, MD  BELBUCA 900 MCG FILM Place 1 Film inside cheek 2 (two) times daily. 03/11/22  Yes [provider]  busPIRone (BUSPAR) 10 MG tablet TAKE ONE TABLET BY MOUTH THREE TIMES DAILY Patient taking differently: Take 10 mg by mouth 3 (three) times daily. 02/19/22  Yes Jerre Simon, MD  calcium carbonate (OSCAL) 1500 (600 Ca) MG TABS tablet Take 1,500 mg by mouth 2 (two) times daily.   Yes [provider]  diclofenac Sodium (VOLTAREN) 1 % GEL APPLY FOUR GRAMS FOUR TIMES DAILY AS DIRECTED Patient taking differently: Apply 4 g topically 4 (four) times daily. APPLY FOUR grams FOUR TIMES DAILY AS DIRECTED 07/03/22  Yes Jerre Simon, MD  diltiazem (CARDIZEM CD) 120 MG 24 hr capsule Take 1 capsule (120 mg total) by mouth 2 (two) times daily. 01/08/22  Yes Jerre Simon, MD  empagliflozin (  JARDIANCE) 10 MG TABS tablet Take 1 tablet (10 mg total) by mouth daily. 06/15/22  Yes Vonna Drafts, MD  fluticasone (FLONASE) 50 MCG/ACT nasal spray INSTILL TWO SPRAYS INTO BOTH NOSTRILS TWICE DAILY Patient taking differently: Place 2 sprays into both nostrils 2 (two) times daily. INSTILL TWO SPRAYS INTO BOTH NOSTRILS TWICE DAILY 10/03/21  Yes Jerre Simon, MD  furosemide (LASIX) 80 MG tablet Take 1 tablet (80 mg total) by mouth 2 (two) times daily. 06/14/22 06/14/23 Yes Lincoln Brigham, MD  gabapentin (NEURONTIN) 300 MG capsule TAKE TWO CAPSULES BY MOUTH EVERY MORNING AND EVERY LUNCH AND TAKE THREE CAPSULES BY MOUTH EVERY EVENING 06/15/22  Yes Jerre Simon, MD   linaclotide [facility]) 72 MCG capsule Take 72 mcg by mouth daily before breakfast.   Yes [provider]  loratadine (CLARITIN) 10 MG tablet Take 1 tablet (10 mg total) by mouth daily. 08/28/21  Yes Jerre Simon, MD  losartan (COZAAR) 25 MG tablet Take 1 tablet (25 mg total) by mouth at bedtime. Patient taking differently: Take 25 mg by mouth at bedtime. 12/04/21  Yes Jerre Simon, MD  montelukast (SINGULAIR) 10 MG tablet TAKE 1 TABLET BY MOUTH EVERYDAY AT BEDTIME Strength: 10 mg Patient taking differently: Take 10 mg by mouth in the morning. 08/15/21  Yes Jerre Simon, MD  Multiple Vitamins-Minerals (BARIATRIC MULTIVITAMINS/IRON) CAPS Take 1 capsule by mouth 2 (two) times daily.   Yes [provider]  nystatin (MYCOSTATIN) 100000 UNIT/ML suspension Take 5 mLs (500,000 Units total) by mouth 4 (four) times daily. Swish and swallow. Patient taking differently: Take 5 mLs by mouth 4 (four) times daily as needed (qid prn). Swish and swallow. 03/27/22  Yes Jerre Simon, MD  omeprazole (PRILOSEC) 20 MG capsule TAKE ONE CAPSULE BY MOUTH EVERY DAY 04/15/22  Yes Jerre Simon, MD  potassium chloride SA (KLOR-CON M) 20 MEQ tablet Take 20 mEq by mouth daily. 08/13/21  Yes [provider]  TRELEGY ELLIPTA 100-62.5-25 MCG/ACT AEPB Inhale 1 puff into the lungs daily. 03/01/22  Yes Jerre Simon, MD  venlafaxine XR (EFFEXOR-XR) 75 MG 24 hr capsule Take 1 capsule (75 mg total) by mouth daily with breakfast. 02/19/22  Yes Jerre Simon, MD  ACCU-CHEK AVIVA PLUS test strip 1 EACH BY OTHER ROUTE 3 (THREE) TIMES DAILY. TEST 3 TIMES DAILY 07/26/21   Celedonio Savage, MD  ketoconazole (NIZORAL) 2 % cream Apply 1 Application topically daily. 11/21/21   Helane Gunther, DPM  NARCAN 4 MG/0.1ML LIQD nasal spray kit Place 0.4 mg into the nose once as needed (accidental overdose). 02/16/18   [provider]      Allergies    Aspirin, Methadone hcl, and Levofloxacin    Review of Systems   Review  of Systems  Respiratory:  Positive for shortness of breath.   Neurological:  Positive for weakness.    Physical Exam Updated Vital Signs BP 129/76   Pulse 100   Temp 98.7 F (37.1 C) (Oral)   Resp (!) 28   LMP 01/29/1992   SpO2 95%  Physical Exam Vitals and nursing note reviewed.  Constitutional:      General: She is not in acute distress.    Appearance: She is well-developed. She is not diaphoretic.     Comments: Morbidly obese  HENT:     Head: Normocephalic and atraumatic.  Eyes:     Pupils: Pupils are equal, round, and reactive to light.  Cardiovascular:     Rate and Rhythm: Normal rate and regular rhythm.  Heart sounds: No murmur heard.    No friction rub. No gallop.  Pulmonary:     Effort: Pulmonary effort is normal.     Breath sounds: Wheezing present. No rales.     Comments: Tachypnea, diffuse wheezes prolonged expiratory effort Abdominal:     General: There is no distension.     Palpations: Abdomen is soft.     Tenderness: There is no abdominal tenderness.  Musculoskeletal:        General: No tenderness.     Cervical back: Normal range of motion and neck supple.     Comments: Mild diffuse pain throughout the low back.  No obvious midline spinal tenderness about her deformities.    Skin:    General: Skin is warm and dry.  Neurological:     Mental Status: She is alert and oriented to person, place, and time.  Psychiatric:        Behavior: Behavior normal.     ED Results / Procedures / Treatments   Labs (all labs ordered are listed, but only abnormal results are displayed) Labs Reviewed  CBC WITH DIFFERENTIAL/PLATELET - Abnormal; Notable for the following components:      Result Value   RBC 6.81 (*)    Hemoglobin 17.1 (*)    HCT 50.2 (*)    MCV 73.7 (*)    MCH 25.1 (*)    RDW 21.4 (*)    nRBC 1.2 (*)    All other components within normal limits  BASIC METABOLIC PANEL - Abnormal; Notable for the following components:   Glucose, Bld 159 (*)     BUN 25 (*)    Creatinine, Ser 1.30 (*)    Calcium 8.8 (*)    GFR, Estimated 48 (*)    All other components within normal limits  BRAIN NATRIURETIC PEPTIDE - Abnormal; Notable for the following components:   B Natriuretic Peptide 1,367.6 (*)    All other components within normal limits  TROPONIN I (HIGH SENSITIVITY)    EKG None  Radiology DG Lumbar Spine Complete  Result Date: 07/10/2022 CLINICAL DATA:  back pain EXAM: LUMBAR SPINE - COMPLETE 4 VIEW COMPARISON:  CT L Spine 09/24/21, CXR L SPine 01/31/22 FINDINGS: Status post L1-L5 posterior spinal fusion. Hardware is intact. There is solid osseous fusion at the L2-L5 vertebral bodies. There is no evidence of osseous fusion at the L1-L2 disc space. Sclerotic appearance of the L1 vertebral body is nonspecific. There also irregular endplate changes of the superior endplate of L1 and inferior endplate of T 13. Assessment for possibility of acute traumatic injury is limited due to the degree of osteopenia. IMPRESSION: 1. Status post L1-L5 posterior spinal fusion. Hardware is intact. 2. Sclerotic appearance of the L1 vertebral body with irregular endplate changes of the superior endplate of L1 and inferior endplate of T13. Recommend further evaluation with a CT or a contrast-enhanced lumbar spine MRI to exclude the possibility of infection. These findings are new compared to 01/31/2022. Electronically Signed   By: Lorenza Cambridge M.D.   On: 07/10/2022 14:21   DG Chest Port 1 View  Result Date: 07/10/2022 CLINICAL DATA:  Back pain EXAM: PORTABLE CHEST 1 VIEW COMPARISON:  06/09/2022 FINDINGS: Examination is limited by significant patient rotation. Cardiac silhouette is enlarged. No confluent airspace opacity. No pleural effusion or pneumothorax. Cervicothoracic fusion hardware is partially imaged. IMPRESSION: Cardiomegaly. No confluent airspace opacity. Electronically Signed   By: Duanne Guess D.O.   On: 07/10/2022 14:19    Procedures .Critical  Care  Performed by: Melene Plan, DO Authorized by: Melene Plan, DO   Critical care provider statement:    Critical care time (minutes):  35   Critical care time was exclusive of:  Separately billable procedures and treating other patients   Critical care was time spent personally by me on the following activities:  Development of treatment plan with patient or surrogate, discussions with consultants, evaluation of patient's response to treatment, examination of patient, ordering and review of laboratory studies, ordering and review of radiographic studies, ordering and performing treatments and interventions, pulse oximetry, re-evaluation of patient's condition and review of old charts   Care discussed with: admitting provider       Medications Ordered in ED Medications  enoxaparin (LOVENOX) injection 40 mg (has no administration in time range)  furosemide (LASIX) injection 40 mg (has no administration in time range)  acetaminophen (TYLENOL) tablet 650 mg (has no administration in time range)    Or  acetaminophen (TYLENOL) suppository 650 mg (has no administration in time range)  ondansetron (ZOFRAN) tablet 4 mg (has no administration in time range)    Or  ondansetron (ZOFRAN) injection 4 mg (has no administration in time range)  ipratropium-albuterol (DUONEB) 0.5-2.5 (3) MG/3ML nebulizer solution 3 mL (has no administration in time range)  ipratropium-albuterol (DUONEB) 0.5-2.5 (3) MG/3ML nebulizer solution 3 mL (3 mLs Nebulization Given 07/10/22 1149)  acetaminophen (TYLENOL) tablet 1,000 mg (1,000 mg Oral Given 07/10/22 1129)  oxyCODONE (Oxy IR/ROXICODONE) immediate release tablet 5 mg (5 mg Oral Given 07/10/22 1129)  furosemide (LASIX) injection 80 mg (80 mg Intravenous Given 07/10/22 1429)    ED Course/ Medical Decision Making/ A&P Clinical Course as of 07/10/22 1510  Wed Jul 10, 2022  1500 Stable  56 YOF with a chief complaint of SOB. Concern for FTT. Leg weakness. MRI L-spine  ordered and pending. Dispo per. [CC]    Clinical Course User Index [CC] Glyn Ade, MD                             Medical Decision Making Amount and/or Complexity of Data Reviewed Labs: ordered. Radiology: ordered.  Risk OTC drugs. Prescription drug management. Decision regarding hospitalization.   57 yo F with a chief complaint of low back pain.  However when EMS was called out for low back pain and they noted that she was profoundly hypoxic and brought her here after placing her oxygen for evaluation.  Patient was just in the hospital about a month ago for something similar.  She tells me that she has not really gotten up and gotten out of bed since has been home because she is worried that her leg will give out from under her.  She did make it to her orthopedic appointment yesterday and had a joint injection.  Has some signs of a COPD exacerbation on my exam.  She does endorse that she continues to smoke every day.  Will give 3 DuoNebs back-to-back and Solu-Medrol.  Chest x-ray to assess for pneumonia.  Blood work.  Chest x-ray independently interpreted by me without focal infiltrate or pneumothorax.  Her BNP is elevated.  Given a dose of Lasix here.  No significant electrolyte abnormality.  No significant anemia.  Plain film of the L-spine with concerning read for possible discitis or osteomyelitis.  Will order a lumbar spine MRI.  I discussed case with medicine.  Will admit.  The patients results and plan were reviewed and  discussed.   Any x-rays performed were independently reviewed by myself.   Differential diagnosis were considered with the presenting HPI.  Medications  enoxaparin (LOVENOX) injection 40 mg (has no administration in time range)  furosemide (LASIX) injection 40 mg (has no administration in time range)  acetaminophen (TYLENOL) tablet 650 mg (has no administration in time range)    Or  acetaminophen (TYLENOL) suppository 650 mg (has no administration in  time range)  ondansetron (ZOFRAN) tablet 4 mg (has no administration in time range)    Or  ondansetron (ZOFRAN) injection 4 mg (has no administration in time range)  ipratropium-albuterol (DUONEB) 0.5-2.5 (3) MG/3ML nebulizer solution 3 mL (has no administration in time range)  ipratropium-albuterol (DUONEB) 0.5-2.5 (3) MG/3ML nebulizer solution 3 mL (3 mLs Nebulization Given 07/10/22 1149)  acetaminophen (TYLENOL) tablet 1,000 mg (1,000 mg Oral Given 07/10/22 1129)  oxyCODONE (Oxy IR/ROXICODONE) immediate release tablet 5 mg (5 mg Oral Given 07/10/22 1129)  furosemide (LASIX) injection 80 mg (80 mg Intravenous Given 07/10/22 1429)    Vitals:   07/10/22 1100 07/10/22 1230 07/10/22 1300 07/10/22 1444  BP: 133/83 (!) 143/92 129/76   Pulse: 95 (!) 101 100   Resp: 20 19 (!) 28   Temp:    98.7 F (37.1 C)  TempSrc:    Oral  SpO2: 93% 95% 95%     Final diagnoses:  Acute on chronic respiratory failure with hypoxia (HCC)  Chronic right-sided low back pain with right-sided sciatica    Admission/ observation were discussed with the admitting physician, patient and/or family and they are comfortable with the plan.          Final Clinical Impression(s) / ED Diagnoses Final diagnoses:  Acute on chronic respiratory failure with hypoxia (HCC)  Chronic right-sided low back pain with right-sided sciatica    Rx / DC Orders ED Discharge Orders     None         Melene Plan, DO 07/10/22 1510

## 2022-07-11 ENCOUNTER — Inpatient Hospital Stay: Payer: 59 | Admitting: Student

## 2022-07-11 DIAGNOSIS — Z981 Arthrodesis status: Secondary | ICD-10-CM | POA: Diagnosis not present

## 2022-07-11 DIAGNOSIS — M8788 Other osteonecrosis, other site: Secondary | ICD-10-CM | POA: Diagnosis not present

## 2022-07-11 DIAGNOSIS — F419 Anxiety disorder, unspecified: Secondary | ICD-10-CM | POA: Diagnosis present

## 2022-07-11 DIAGNOSIS — M1611 Unilateral primary osteoarthritis, right hip: Secondary | ICD-10-CM | POA: Diagnosis not present

## 2022-07-11 DIAGNOSIS — M19011 Primary osteoarthritis, right shoulder: Secondary | ICD-10-CM | POA: Diagnosis not present

## 2022-07-11 DIAGNOSIS — J4489 Other specified chronic obstructive pulmonary disease: Secondary | ICD-10-CM | POA: Diagnosis not present

## 2022-07-11 DIAGNOSIS — E1140 Type 2 diabetes mellitus with diabetic neuropathy, unspecified: Secondary | ICD-10-CM | POA: Diagnosis not present

## 2022-07-11 DIAGNOSIS — E1142 Type 2 diabetes mellitus with diabetic polyneuropathy: Secondary | ICD-10-CM | POA: Diagnosis not present

## 2022-07-11 DIAGNOSIS — N183 Chronic kidney disease, stage 3 unspecified: Secondary | ICD-10-CM | POA: Diagnosis not present

## 2022-07-11 DIAGNOSIS — Z743 Need for continuous supervision: Secondary | ICD-10-CM | POA: Diagnosis not present

## 2022-07-11 DIAGNOSIS — I5032 Chronic diastolic (congestive) heart failure: Secondary | ICD-10-CM | POA: Diagnosis not present

## 2022-07-11 DIAGNOSIS — I4719 Other supraventricular tachycardia: Secondary | ICD-10-CM | POA: Diagnosis not present

## 2022-07-11 DIAGNOSIS — I5033 Acute on chronic diastolic (congestive) heart failure: Secondary | ICD-10-CM | POA: Diagnosis not present

## 2022-07-11 DIAGNOSIS — I130 Hypertensive heart and chronic kidney disease with heart failure and stage 1 through stage 4 chronic kidney disease, or unspecified chronic kidney disease: Secondary | ICD-10-CM | POA: Diagnosis not present

## 2022-07-11 DIAGNOSIS — M5441 Lumbago with sciatica, right side: Secondary | ICD-10-CM | POA: Diagnosis present

## 2022-07-11 DIAGNOSIS — Z8249 Family history of ischemic heart disease and other diseases of the circulatory system: Secondary | ICD-10-CM | POA: Diagnosis not present

## 2022-07-11 DIAGNOSIS — I10 Essential (primary) hypertension: Secondary | ICD-10-CM | POA: Diagnosis not present

## 2022-07-11 DIAGNOSIS — Z7984 Long term (current) use of oral hypoglycemic drugs: Secondary | ICD-10-CM | POA: Diagnosis not present

## 2022-07-11 DIAGNOSIS — E1122 Type 2 diabetes mellitus with diabetic chronic kidney disease: Secondary | ICD-10-CM | POA: Diagnosis not present

## 2022-07-11 DIAGNOSIS — I509 Heart failure, unspecified: Secondary | ICD-10-CM

## 2022-07-11 DIAGNOSIS — G4733 Obstructive sleep apnea (adult) (pediatric): Secondary | ICD-10-CM | POA: Diagnosis present

## 2022-07-11 DIAGNOSIS — M6281 Muscle weakness (generalized): Secondary | ICD-10-CM | POA: Diagnosis not present

## 2022-07-11 DIAGNOSIS — E8809 Other disorders of plasma-protein metabolism, not elsewhere classified: Secondary | ICD-10-CM | POA: Diagnosis not present

## 2022-07-11 DIAGNOSIS — N1831 Chronic kidney disease, stage 3a: Secondary | ICD-10-CM | POA: Diagnosis not present

## 2022-07-11 DIAGNOSIS — E876 Hypokalemia: Secondary | ICD-10-CM | POA: Diagnosis present

## 2022-07-11 DIAGNOSIS — F319 Bipolar disorder, unspecified: Secondary | ICD-10-CM | POA: Diagnosis present

## 2022-07-11 DIAGNOSIS — R0600 Dyspnea, unspecified: Secondary | ICD-10-CM | POA: Diagnosis not present

## 2022-07-11 DIAGNOSIS — F17210 Nicotine dependence, cigarettes, uncomplicated: Secondary | ICD-10-CM | POA: Diagnosis present

## 2022-07-11 DIAGNOSIS — M109 Gout, unspecified: Secondary | ICD-10-CM | POA: Diagnosis not present

## 2022-07-11 DIAGNOSIS — Z79899 Other long term (current) drug therapy: Secondary | ICD-10-CM | POA: Diagnosis not present

## 2022-07-11 DIAGNOSIS — Z7401 Bed confinement status: Secondary | ICD-10-CM | POA: Diagnosis not present

## 2022-07-11 DIAGNOSIS — F1021 Alcohol dependence, in remission: Secondary | ICD-10-CM | POA: Diagnosis present

## 2022-07-11 DIAGNOSIS — M5459 Other low back pain: Secondary | ICD-10-CM | POA: Diagnosis not present

## 2022-07-11 DIAGNOSIS — M7541 Impingement syndrome of right shoulder: Secondary | ICD-10-CM | POA: Diagnosis not present

## 2022-07-11 DIAGNOSIS — J9621 Acute and chronic respiratory failure with hypoxia: Secondary | ICD-10-CM | POA: Diagnosis not present

## 2022-07-11 DIAGNOSIS — I471 Supraventricular tachycardia, unspecified: Secondary | ICD-10-CM | POA: Diagnosis not present

## 2022-07-11 DIAGNOSIS — G8929 Other chronic pain: Secondary | ICD-10-CM | POA: Diagnosis present

## 2022-07-11 DIAGNOSIS — E785 Hyperlipidemia, unspecified: Secondary | ICD-10-CM | POA: Diagnosis not present

## 2022-07-11 DIAGNOSIS — M5414 Radiculopathy, thoracic region: Secondary | ICD-10-CM | POA: Diagnosis not present

## 2022-07-11 DIAGNOSIS — Z9981 Dependence on supplemental oxygen: Secondary | ICD-10-CM | POA: Diagnosis not present

## 2022-07-11 DIAGNOSIS — M48062 Spinal stenosis, lumbar region with neurogenic claudication: Secondary | ICD-10-CM | POA: Diagnosis not present

## 2022-07-11 DIAGNOSIS — R0601 Orthopnea: Secondary | ICD-10-CM | POA: Diagnosis not present

## 2022-07-11 DIAGNOSIS — R531 Weakness: Secondary | ICD-10-CM | POA: Diagnosis not present

## 2022-07-11 DIAGNOSIS — M48061 Spinal stenosis, lumbar region without neurogenic claudication: Secondary | ICD-10-CM | POA: Diagnosis present

## 2022-07-11 LAB — CBC
HCT: 49.6 % — ABNORMAL HIGH (ref 36.0–46.0)
Hemoglobin: 16.4 g/dL — ABNORMAL HIGH (ref 12.0–15.0)
MCH: 24.8 pg — ABNORMAL LOW (ref 26.0–34.0)
MCHC: 33.1 g/dL (ref 30.0–36.0)
MCV: 75 fL — ABNORMAL LOW (ref 80.0–100.0)
Platelets: 336 10*3/uL (ref 150–400)
RBC: 6.61 MIL/uL — ABNORMAL HIGH (ref 3.87–5.11)
RDW: 21.5 % — ABNORMAL HIGH (ref 11.5–15.5)
WBC: 9.7 10*3/uL (ref 4.0–10.5)
nRBC: 0.7 % — ABNORMAL HIGH (ref 0.0–0.2)

## 2022-07-11 LAB — COMPREHENSIVE METABOLIC PANEL
ALT: 22 U/L (ref 0–44)
AST: 16 U/L (ref 15–41)
Albumin: 3.1 g/dL — ABNORMAL LOW (ref 3.5–5.0)
Alkaline Phosphatase: 85 U/L (ref 38–126)
Anion gap: 13 (ref 5–15)
BUN: 27 mg/dL — ABNORMAL HIGH (ref 6–20)
CO2: 26 mmol/L (ref 22–32)
Calcium: 8.9 mg/dL (ref 8.9–10.3)
Chloride: 98 mmol/L (ref 98–111)
Creatinine, Ser: 1.24 mg/dL — ABNORMAL HIGH (ref 0.44–1.00)
GFR, Estimated: 50 mL/min — ABNORMAL LOW (ref >60)
Glucose, Bld: 125 mg/dL — ABNORMAL HIGH (ref 70–99)
Potassium: 3.9 mmol/L (ref 3.5–5.1)
Sodium: 137 mmol/L (ref 135–145)
Total Bilirubin: 0.7 mg/dL (ref 0.3–1.2)
Total Protein: 6.7 g/dL (ref 6.5–8.1)

## 2022-07-11 LAB — MRSA NEXT GEN BY PCR, NASAL: MRSA by PCR Next Gen: DETECTED — AB

## 2022-07-11 LAB — GLUCOSE, CAPILLARY
Glucose-Capillary: 181 mg/dL — ABNORMAL HIGH (ref 70–99)
Glucose-Capillary: 132 mg/dL — ABNORMAL HIGH (ref 70–99)
Glucose-Capillary: 165 mg/dL — ABNORMAL HIGH (ref 70–99)
Glucose-Capillary: 132 mg/dL — ABNORMAL HIGH (ref 70–99)

## 2022-07-11 MED ORDER — DICLOFENAC SODIUM 1 % EX GEL
2.0000 g | Freq: Four times a day (QID) | CUTANEOUS | Status: DC
  Administered 2022-07-11 – 2022-07-14 (×11): 2 g via TOPICAL
  Filled 2022-07-11: qty 100

## 2022-07-11 MED ORDER — ORAL CARE MOUTH RINSE: 15.0000 mL | OROMUCOSAL | Status: DC | PRN

## 2022-07-11 MED ORDER — ORAL CARE MOUTH RINSE
15.0000 mL | OROMUCOSAL | Status: DC
  Administered 2022-07-11 – 2022-07-14 (×10): 15 mL via OROMUCOSAL

## 2022-07-11 NOTE — Telephone Encounter (Signed)
Received voicemail from [employer] that patient is needing home health orders since she was discharged from SNF.  Patient is now inpatient and would benefit from going back to a SNF for rehab.  Will forward to MD.  Burnard Hawthorne

## 2022-07-11 NOTE — Evaluation (Signed)
Physical Therapy Evaluation Patient Details Name: [name] MRN: [id number] DOB: 09-Feb-1965 Today's Date: 07/11/2022  History of Present Illness  57 y.o. female  pt  with dyspnea, O2 sat in the 80s; dx and admitted with  with acute on chronic dCHF.  PMH: alcohol abuse in remission, asthma, bipolar disorder, chronic diastolic CHF, COPD, chronic hypoxemic respiratory failure, stage III CKD, type 2 diabetes, depression/GAD, fibrocystic breast disease, GERD, positive hep B surface antigen, condyloma acuminatum, hyperlipidemia, hypertension, incomplete LBBB, lung nodule, osteoarthritis OSA on CPAP, polyneuropathy, polysubstance abuse, PSVT, spinal stenosis associated with lower extremities spasms w  Clinical Impression  Pt admitted with above diagnosis.  Pt is pleasant and cooperative, fatigues easily, mildly dyspneic after light activity.  SpO2 = 89-90% on 2L [place] O2. Pt expresses desire to return to rehab setting.   Will benefit from PT in acute setting as well as f/u.    Pt currently with functional limitations due to the deficits listed below (see PT Problem List). Pt will benefit from acute skilled PT to increase their independence and safety with mobility to allow discharge.          Recommendations for follow up therapy are one component of a multi-disciplinary discharge planning process, led by the attending physician.  Recommendations may be updated based on patient status, additional functional criteria and insurance authorization.  Follow Up Recommendations Can patient physically be transported by private vehicle: Yes     Assistance Recommended at Discharge Intermittent Supervision/Assistance  Patient can return home with the following  A little help with walking and/or transfers;A little help with bathing/dressing/bathroom;Help with stairs or ramp for entrance;Assistance with cooking/housework;Assist for transportation    Equipment Recommendations None recommended by PT   Recommendations for Other Services       Functional Status Assessment       Precautions / Restrictions Precautions Precautions: Fall Restrictions Weight Bearing Restrictions: No      Mobility  Bed Mobility Overal bed mobility: Needs Assistance Bed Mobility: Supine to Sit     Supine to sit: Supervision     General bed mobility comments: incr time, supervision for safety    Transfers Overall transfer level: Needs assistance Equipment used: Rolling walker (2 wheels) Transfers: Sit to/from Stand Sit to Stand: Min assist           General transfer comment: light assist to rise and transition to RW, bed at lowest setting    Ambulation/Gait Ambulation/Gait assistance: Min assist, Min guard Gait Distance (Feet): 20 Feet Assistive device: Rolling walker (2 wheels) Gait Pattern/deviations: Step-through pattern, Decreased stride length       General Gait Details: slighlty unsteady gait, fatigues easily. no overt LOB. SpO2=89-90% on 2L  Stairs            Wheelchair Mobility    Modified Rankin (Stroke Patients Only)       Balance Overall balance assessment: Needs assistance Sitting-balance support: No upper extremity supported, Feet supported Sitting balance-Leahy Scale: Good     Standing balance support: Reliant on assistive device for balance, During functional activity Standing balance-Leahy Scale: Poor                               Pertinent Vitals/Pain Pain Assessment Pain Assessment: Faces Faces Pain Scale: Hurts a little bit Pain Location: back Pain Descriptors / Indicators: Grimacing Pain Intervention(s): Limited activity within patient's tolerance, Monitored during session, Repositioned    Home Living Family/patient expects  to be discharged to:: Skilled nursing facility Living Arrangements: Non-relatives/Friends                 Additional Comments: roommate works during the week- 3 jobs per pt    Prior Function  Prior Level of Function : Independent/Modified Independent             Mobility Comments: amb with cane, doesn't drive, orders groceries; recently not able to mobilize as much d/t multiple hospital admissions       Hand Dominance        Extremity/Trunk Assessment   Upper Extremity Assessment Upper Extremity Assessment: Defer to OT evaluation    Lower Extremity Assessment Lower Extremity Assessment: Overall WFL for tasks assessed       Communication   Communication: No difficulties  Cognition Arousal/Alertness: Awake/alert Behavior During Therapy: WFL for tasks assessed/performed Overall Cognitive Status: Within Functional Limits for tasks assessed                                          General Comments      Exercises     Assessment/Plan    PT Assessment Patient needs continued PT services  PT Problem List Decreased activity tolerance;Decreased mobility;Cardiopulmonary status limiting activity       PT Treatment Interventions DME instruction;Therapeutic exercise;Gait training;Functional mobility training;Therapeutic activities;Patient/family education    PT Goals (Current goals can be found in the Care Plan section)  Acute Rehab PT Goals Patient Stated Goal: go to rehab PT Goal Formulation: With patient Time For Goal Achievement: 07/25/22 Potential to Achieve Goals: Good    Frequency Min 1X/week     Co-evaluation               AM-PAC PT "6 Clicks" Mobility  Outcome Measure Help needed turning from your back to your side while in a flat bed without using bedrails?: A Little Help needed moving from lying on your back to sitting on the side of a flat bed without using bedrails?: A Little Help needed moving to and from a bed to a chair (including a wheelchair)?: A Little Help needed standing up from a chair using your arms (e.g., wheelchair or bedside chair)?: A Little Help needed to walk in hospital room?: A Little Help needed  climbing 3-5 steps with a railing? : A Lot 6 Click Score: 17    End of Session Equipment Utilized During Treatment: Gait belt Activity Tolerance: Patient tolerated treatment well Patient left: in chair;with call bell/phone within reach;with chair alarm set   PT Visit Diagnosis: Other abnormalities of gait and mobility (R26.89);Difficulty in walking, not elsewhere classified (R26.2)    Time: 1346-1400 PT Time Calculation (min) (ACUTE ONLY): 14 min   Charges:   PT Evaluation $PT Eval Low Complexity: 1 Low          Carlyle Mcelrath, PT  Acute Rehab Dept ([facility]) [phone]  07/11/2022   [facility] 07/11/2022, 4:01 PM

## 2022-07-11 NOTE — Progress Notes (Signed)
PROGRESS NOTE    [name]  ZOX:[id number] DOB: 03-31-1965 DOA: 07/10/2022 PCP: Jerre Simon, MD    Brief Narrative:   [name] is a 57 y.o. female with medical history significant of alcohol abuse in remission, asthma, bipolar disorder, chronic diastolic CHF, COPD, chronic hypoxemic respiratory failure, stage III CKD, type 2 diabetes, depression/GAD, fibrocystic breast disease, GERD, positive hep B surface antigen, condyloma acuminatum, hyperlipidemia, hypertension, incomplete LBBB, lung nodule, osteoarthritis OSA on CPAP, polyneuropathy, polysubstance abuse, PSVT, spinal stenosis associated with lower extremities spasms who was discharged from rehab 2 weeks ago after she spent about a week there due to physical deconditioning following a 5-day hospitalization from 06/09/2022 until 06/14/2022 in the setting of acute diastolic heart failure who is returning to the emergency department due to dyspnea with an O2 sat in the 80s when EMS arrived to her home and lower spine back pain for the past 2 weeks after having a fall.  She has been mostly in bed for the past 2 weeks.    Assessment and Plan: Acute on chronic diastolic congestive heart failure (HCC) Supplemental oxygen as needed. -furosemide 40 mg IVP twice daily. -Continue losartan Monitor daily weights, intake and output. -daily labs      Back pain Continue baclofen 10 mg p.o. 3 times daily. Continue buprenorphine 1 film p.o. twice daily. Oxycodone 5 mg every 4 hours as needed for pain. Continue gabapentin 300 mg p.o. twice daily and 900 mg at bedtime. MRI done: per NS: She's established with Dr. Jordan Likes at our office. There's not anything to be done acutely, but she should follow up with him as an outpatient. There are noticeable changes since her last imaging in clinic and he will need to go over these with her.      Hyperlipidemia Continue atorvastatin      Depression with anxiety Continue buspirone 10 mg p.o. 3 times  daily. Continue Effexor 75 mg p.o. daily.     Obstructive sleep apnea treated with BiPAP Continue BiPAP at bedtime.     Type 2 diabetes mellitus with stage 3 chronic kidney disease,  without long-term current use of insulin (HCC) HbA1c 6.6% last month. Carbohydrate modified diet. Continue Jardiance 10 mg p.o. daily. -SSI    COPD (chronic obstructive pulmonary disease) with chronic bronchitis Short acting beta agonist as needed. Continue loratadine 10 mg p.o. daily. Continue montelukast 10 mg p.o. daily. Smoking cessation advised.     Essential hypertension -resume home meds   Obesity Estimated body mass index is 37.83 kg/m as calculated from the following:   Height as of this encounter: 5\' 9"  (1.753 m).   Weight as of this encounter: 116.2 kg.     Tobacco abuse Tobacco cessation advised. Nicotine replacement therapy order as needed.     Diabetic neuropathy associated with type 2 diabetes mellitus (HCC) Continue gabapentin as above.   PT/OT eval-- suspect will need to return to SNF   DVT prophylaxis: enoxaparin (LOVENOX) injection 40 mg Start: 07/10/22 2200    Code Status: Full Code   Disposition Plan:  Level of care: Telemetry Status is: Inpatient Remains inpatient appropriate     Consultants:  NS (chat)   Subjective: Feels like she did better when she was at the SNF  Objective: Vitals:   07/11/22 0156 07/11/22 0503 07/11/22 0855 07/11/22 1050  BP: 135/89 131/85  133/80  Pulse: 77 75  74  Resp: 15 16  17   Temp: (!) 97.5 F (36.4 C) 97.8 F (36.6  C)  98.4 F (36.9 C)  TempSrc: Oral Oral  Oral  SpO2: 96% 94% 91% 98%  Weight: 116.2 kg     Height: 5\' 9"  (1.753 m)       Intake/Output Summary (Last 24 hours) at 07/11/2022 1308 Last data filed at 07/11/2022 1100 Gross per 24 hour  Intake 640 ml  Output 700 ml  Net -60 ml   Filed Weights   07/11/22 0156  Weight: 116.2 kg    Examination:   General: Appearance:    Obese female in no acute  distress     Lungs:     Clear to auscultation bilaterally, respirations unlabored  Heart:    Normal heart rate. Normal rhythm. No murmurs, rubs, or gallops.    MS:   All extremities are intact.    Neurologic:   Awake, alert, oriented x 3. No apparent focal neurological           defect.        Data Reviewed: I have personally reviewed following labs and imaging studies  CBC: Recent Labs  Lab 07/10/22 1110 07/11/22 0431  WBC 8.5 9.7  NEUTROABS 5.4  --   HGB 17.1* 16.4*  HCT 50.2* 49.6*  MCV 73.7* 75.0*  PLT 333 336   Basic Metabolic Panel: Recent Labs  Lab 07/10/22 1110 07/11/22 0431  NA 136 137  K 3.9 3.9  CL 99 98  CO2 25 26  GLUCOSE 159* 125*  BUN 25* 27*  CREATININE 1.30* 1.24*  CALCIUM 8.8* 8.9   GFR: Estimated Creatinine Clearance: 67.3 mL/min (A) (by C-G formula based on SCr of 1.24 mg/dL (H)). Liver Function Tests: Recent Labs  Lab 07/11/22 0431  AST 16  ALT 22  ALKPHOS 85  BILITOT 0.7  PROT 6.7  ALBUMIN 3.1*   No results for input(s): "LIPASE", "AMYLASE" in the last 168 hours. No results for input(s): "AMMONIA" in the last 168 hours. Coagulation Profile: No results for input(s): "INR", "PROTIME" in the last 168 hours. Cardiac Enzymes: No results for input(s): "CKTOTAL", "CKMB", "CKMBINDEX", "TROPONINI" in the last 168 hours. BNP (last 3 results) No results for input(s): "PROBNP" in the last 8760 hours. HbA1C: No results for input(s): "HGBA1C" in the last 72 hours. CBG: Recent Labs  Lab 07/10/22 2006 07/11/22 0820 07/11/22 1200  GLUCAP 236* 181* 132*   Lipid Profile: No results for input(s): "CHOL", "HDL", "LDLCALC", "TRIG", "CHOLHDL", "LDLDIRECT" in the last 72 hours. Thyroid Function Tests: No results for input(s): "TSH", "T4TOTAL", "FREET4", "T3FREE", "THYROIDAB" in the last 72 hours. Anemia Panel: No results for input(s): "VITAMINB12", "FOLATE", "FERRITIN", "TIBC", "IRON", "RETICCTPCT" in the last 72 hours. Sepsis Labs: No  results for input(s): "PROCALCITON", "LATICACIDVEN" in the last 168 hours.  Recent Results (from the past 240 hour(s))  MRSA Next Gen by PCR, Nasal     Status: Abnormal   Collection Time: 07/11/22  2:49 AM   Specimen: Nasal Mucosa; Nasal Swab  Result Value Ref Range Status   MRSA by PCR Next Gen DETECTED (A) NOT DETECTED Final    Comment: (NOTE) The GeneXpert MRSA Assay (FDA approved for NASAL specimens only), is one component of a comprehensive MRSA colonization surveillance program. It is not intended to diagnose MRSA infection nor to guide or monitor treatment for MRSA infections. Test performance is not FDA approved in patients less than 26 years old. Performed at [facility], [address], [place], [place] [address]          Radiology Studies:  MR Lumbar Spine W Wo Contrast  Result Date: 07/10/2022 CLINICAL DATA:  Low back pain, cauda equina syndrome suspected. EXAM: MRI LUMBAR SPINE WITHOUT AND WITH CONTRAST TECHNIQUE: Multiplanar and multiecho pulse sequences of the lumbar spine were obtained without and with intravenous contrast. CONTRAST:  10mL GADAVIST GADOBUTROL 1 MMOL/ML IV SOLN COMPARISON:  Lumbar spine MRI 07/18/2020. CT lumbar spine 05/25/2021. FINDINGS: Segmentation: Conventional numbering is assumed with 5 non-rib-bearing, lumbar type vertebral bodies. Alignment:  Normal. Vertebrae: Interval postoperative changes of extension of interbody and posterior spinal fusion cranially to L2-3. Unchanged postoperative appearance from prior L3-S1 interbody and posterior spinal fusion with laminectomies extending from at least T12-L2 and L4-S1. Evolving osteonecrotic cleft at the L1-2 disc space with fluid, but no associated enhancement or endplate destruction. Conus medullaris and cauda equina: Conus extends to the L2 level. Conus and cauda equina appear normal. Paraspinal and other soft tissues: Unremarkable. Disc levels: Severe bilateral neural foraminal  narrowing at L1-2. No spinal canal stenosis in the lumbar spine. IMPRESSION: 1. Interval postoperative changes of extension of interbody and posterior spinal fusion cranially to L2-3. 2. Evolving osteonecrotic cleft at the L1-2 disc space with fluid, but no associated enhancement or endplate destruction. 3. Severe bilateral neural foraminal narrowing at L1-2. 4. No spinal canal stenosis in the lumbar spine. Electronically Signed   By: Orvan Falconer M.D.   On: 07/10/2022 16:17   DG Lumbar Spine Complete  Result Date: 07/10/2022 CLINICAL DATA:  back pain EXAM: LUMBAR SPINE - COMPLETE 4 VIEW COMPARISON:  CT L Spine 09/24/21, CXR L SPine 01/31/22 FINDINGS: Status post L1-L5 posterior spinal fusion. Hardware is intact. There is solid osseous fusion at the L2-L5 vertebral bodies. There is no evidence of osseous fusion at the L1-L2 disc space. Sclerotic appearance of the L1 vertebral body is nonspecific. There also irregular endplate changes of the superior endplate of L1 and inferior endplate of T 13. Assessment for possibility of acute traumatic injury is limited due to the degree of osteopenia. IMPRESSION: 1. Status post L1-L5 posterior spinal fusion. Hardware is intact. 2. Sclerotic appearance of the L1 vertebral body with irregular endplate changes of the superior endplate of L1 and inferior endplate of T13. Recommend further evaluation with a CT or a contrast-enhanced lumbar spine MRI to exclude the possibility of infection. These findings are new compared to 01/31/2022. Electronically Signed   By: Lorenza Cambridge M.D.   On: 07/10/2022 14:21   DG Chest Port 1 View  Result Date: 07/10/2022 CLINICAL DATA:  Back pain EXAM: PORTABLE CHEST 1 VIEW COMPARISON:  06/09/2022 FINDINGS: Examination is limited by significant patient rotation. Cardiac silhouette is enlarged. No confluent airspace opacity. No pleural effusion or pneumothorax. Cervicothoracic fusion hardware is partially imaged. IMPRESSION: Cardiomegaly. No  confluent airspace opacity. Electronically Signed   By: Duanne Guess D.O.   On: 07/10/2022 14:19        Scheduled Meds:  atorvastatin  40 mg Oral Daily   baclofen  10 mg Oral TID   Buprenorphine HCl  1 Film Buccal BID   busPIRone  10 mg Oral TID   diltiazem  120 mg Oral BID   empagliflozin  10 mg Oral Daily   enoxaparin (LOVENOX) injection  40 mg Subcutaneous Q24H   fluticasone furoate-vilanterol  1 puff Inhalation Daily   And   umeclidinium bromide  1 puff Inhalation Daily   furosemide  40 mg Intravenous BID   gabapentin  300 mg Oral BID WC   gabapentin  900 mg Oral  QHS   insulin aspart  0-20 Units Subcutaneous TID WC   linaclotide  72 mcg Oral QAC breakfast   loratadine  10 mg Oral Daily   losartan  25 mg Oral QHS   montelukast  10 mg Oral QHS   mouth rinse  15 mL Mouth Rinse 4 times per day   pantoprazole  40 mg Oral Daily   potassium chloride SA  20 mEq Oral Daily   venlafaxine XR  75 mg Oral Q breakfast   Continuous Infusions:   LOS: 0 days    Time spent: 45 minutes spent on chart review, discussion with nursing staff, consultants, updating family and interview/physical exam; more than 50% of that time was spent in counseling and/or coordination of care.    Joseph Art, DO [facility] Available via Epic secure chat 7am-7pm After these hours, please refer to coverage provider listed on [email or web address] 07/11/2022, 1:08 PM

## 2022-07-11 NOTE — Progress Notes (Signed)
Heart Failure Navigator Progress Note  Assessed for Heart & Vascular TOC clinic readiness.  Patient with a EF 60-65%, Has a scheduled [facility] appointment on 07/31/2022.   Navigator available for reassessment of patient.   Rhae Hammock, BSN, Scientist, clinical (histocompatibility and immunogenetics) Only

## 2022-07-11 NOTE — Telephone Encounter (Signed)
Called and verified patient over the phone.  And is currently admitted in the hospital and patient reports she returned back to the hospital due to significant back pain limiting her mobility.  In our conversation patient expressed desire to return to SNF.  Patient stated after her last hospitalization was sent to SNF for 14 days and discharged after 14 days from the [facility] health clinic and at times she feels like she was not ready to be discharged from the SNF.  Informed patient that given that she is currently admitted in the hospital will likely waork with physical therapy/Occupational Therapy who will make recommendation for SNF or home health.  Patient will discussed needs for SNF with the hospitalist.  Of note she follows with outpatient orthopedic/spine group who manages her lower back pain.

## 2022-07-11 NOTE — TOC Initial Note (Signed)
Transition of Care [facility]) - Initial/Assessment Note    Patient Details  Name: [name] MRN: [id number] Date of Birth: Mar 26, 1965  [facility] CM/SW Contact:    Howell Rucks, RN Phone Number: 07/11/2022, 8:19 AM  Clinical Narrative:  [facility] consult received for SNF Placement and Heart Failure Navigation Team. PT eval pending, await recommendation. Heart Failure Navigation Team order already in place. Per H&P, pt with recent inpatient stay with SNF stay post acute at [facility]. TOC will continue to follow.                    Patient Goals and CMS Choice            Expected Discharge Plan and Services                                              Prior Living Arrangements/Services                       Activities of Daily Living Home Assistive Devices/Equipment: Eyeglasses, Blood pressure cuff, Dentures (specify type), Shower chair with back, Raised toilet seat with rails ADL Screening (condition at time of admission) Patient's cognitive ability adequate to safely complete daily activities?: No Is the patient deaf or have difficulty hearing?: No Does the patient have difficulty seeing, even when wearing glasses/contacts?: No Does the patient have difficulty concentrating, remembering, or making decisions?: No Patient able to express need for assistance with ADLs?: Yes Does the patient have difficulty dressing or bathing?: Yes Independently performs ADLs?: No Communication: Independent Dressing (OT): Needs assistance Is this a change from baseline?: Pre-admission baseline Grooming: Needs assistance Is this a change from baseline?: Pre-admission baseline Feeding: Independent Bathing: Needs assistance Is this a change from baseline?: Pre-admission baseline Toileting: Needs assistance Is this a change from baseline?: Pre-admission baseline In/Out Bed: Needs assistance Is this a change from baseline?: Pre-admission  baseline Walks in Home: Needs assistance Is this a change from baseline?: Pre-admission baseline Does the patient have difficulty walking or climbing stairs?: Yes Weakness of Legs: Right Weakness of Arms/Hands: Left  Permission Sought/Granted                  Emotional Assessment              Admission diagnosis:  Acute on chronic diastolic congestive heart failure (HCC) [I50.33] Acute on chronic respiratory failure with hypoxia (HCC) [J96.21] Chronic right-sided low back pain with right-sided sciatica [M54.41, G89.29] Patient Active Problem List   Diagnosis Date Noted   Acute on chronic diastolic congestive heart failure (HCC) 07/10/2022   Acute on chronic heart failure with preserved ejection fraction (HFpEF) (HCC) 06/09/2022   Acute exacerbation of CHF (congestive heart failure) (HCC) 06/09/2022   Panniculitis 12/04/2021   Infection of lip 08/27/2021   Lumbar stenosis with neurogenic claudication 06/29/2021   Lumbar radiculopathy 06/29/2021   Biceps tendinopathy of right upper extremity 05/03/2021   Irregular heartbeat 04/11/2021   Neuropathic pain of foot 03/28/2021   Chronic congestion of paranasal sinus 03/28/2021   Impingement syndrome of right shoulder 03/08/2021   Arthritis of right acromioclavicular joint 03/08/2021   Acute medial meniscus tear of right knee 03/08/2021   Acute lateral meniscus tear of right knee 03/08/2021   Spinal stenosis of thoracic region with radiculopathy    Bilateral leg  numbness    Spinal stenosis of lumbar region with neurogenic claudication    Primary osteoarthritis of right knee    Impaired ambulation 01/10/2021   Status post lumbar laminectomy 10/03/2020   Cubital tunnel syndrome on left 05/02/2020   Vaginal discharge 01/20/2020   Prolapse of anterior vaginal wall 11/10/2019   Skin ulcer of left thigh, limited to breakdown of skin (HCC) 09/09/2019   Ulnar nerve compression 08/06/2019   Diabetic neuropathy associated with  type 2 diabetes mellitus (HCC) 08/06/2019   Body mass index 50.0-59.9, adult (HCC) 06/12/2019   Impingement syndrome of left shoulder 05/07/2019   Polyp of colon    Polyp of ascending colon    Rectal discomfort 02/12/2019   Osteoarthritis of right hip 02/12/2019   Nontraumatic tear of left supraspinatus tendon 12/30/2018   Gout 12/17/2018   Nontraumatic complete tear of right rotator cuff 12/08/2018   Rotator cuff tear, right 12/01/2018   Tobacco abuse 08/21/2018   Right groin pain 07/20/2018   Depressed mood 07/13/2018   Paroxysmal SVT (supraventricular tachycardia) 03/20/2018   Pure hypercholesterolemia 03/20/2018   Muscle cramping 02/25/2018   Nodule of upper lobe of right lung 02/13/2018   Falls, subsequent encounter 01/30/2018   Cervical post-laminectomy syndrome 11/13/2017   Pain in left foot 11/04/2017   Essential hypertension 10/27/2017   Solitary pulmonary nodule 10/07/2017   Restrictive lung disease secondary to obesity 10/01/2017   Polycythemia, secondary 10/01/2017   Chronic viral hepatitis B without delta-agent (HCC) 07/08/2017   COPD (chronic obstructive pulmonary disease) with chronic bronchitis 06/27/2017   Chronic kidney disease (CKD), stage IV (severe) (HCC) 01/14/2017   Decubitus ulcer 12/13/2016   Type 2 diabetes mellitus with stage 3 chronic kidney disease, with long-term current use of insulin (HCC) 12/12/2016   Urge incontinence of urine 12/05/2016   Trigger finger, left index finger 07/15/2016   Back pain 04/07/2014   Morbid obesity (HCC) 10/02/2012   Chronic diastolic CHF (congestive heart failure) (HCC) 04/07/2012   Skin lesion of scalp 03/15/2011   GERD 01/26/2010   Obstructive sleep apnea treated with BiPAP 02/03/2008   FIBROCYSTIC BREAST DISEASE 10/28/2006   Hyperlipidemia 03/27/2006   Obesity hypoventilation syndrome (HCC) 03/27/2006   Depression with anxiety 03/27/2006   PCP:  Jerre Simon, MD Pharmacy:   [facility] - [place], [place] -  [address] [place] [address] Phone: [phone] Fax: [phone]  [id number] - [place], [place] - [address] AT [facility] [address] [place] [address] Phone: [phone] Fax: [phone]     Social Determinants of Health (SDOH) Social History: SDOH Screenings   Food Insecurity: No Food Insecurity (07/11/2022)  Housing: Low Risk  (07/11/2022)  Transportation Needs: No Transportation Needs (07/11/2022)  Recent Concern: Transportation Needs - Unmet Transportation Needs (06/09/2022)  Utilities: Not At Risk (07/11/2022)  Alcohol Screen: Low Risk  (04/02/2022)  Depression (PHQ2-9): Low Risk  (04/02/2022)  Financial Resource Strain: Low Risk  (06/06/2022)  Physical Activity: Inactive (06/06/2022)  Social Connections: Socially Isolated (06/06/2022)  Stress: No Stress Concern Present (06/06/2022)  Tobacco Use: High Risk (07/10/2022)   SDOH Interventions:     Readmission Risk Interventions     No data to display

## 2022-07-12 DIAGNOSIS — I5033 Acute on chronic diastolic (congestive) heart failure: Secondary | ICD-10-CM | POA: Diagnosis not present

## 2022-07-12 LAB — CBC
HCT: 52.2 % — ABNORMAL HIGH (ref 36.0–46.0)
Hemoglobin: 17.5 g/dL — ABNORMAL HIGH (ref 12.0–15.0)
MCH: 25.2 pg — ABNORMAL LOW (ref 26.0–34.0)
MCHC: 33.5 g/dL (ref 30.0–36.0)
MCV: 75.2 fL — ABNORMAL LOW (ref 80.0–100.0)
Platelets: 354 10*3/uL (ref 150–400)
RBC: 6.94 MIL/uL — ABNORMAL HIGH (ref 3.87–5.11)
RDW: 21.6 % — ABNORMAL HIGH (ref 11.5–15.5)
WBC: 14.8 10*3/uL — ABNORMAL HIGH (ref 4.0–10.5)
nRBC: 0.1 % (ref 0.0–0.2)

## 2022-07-12 LAB — GLUCOSE, CAPILLARY
Glucose-Capillary: 120 mg/dL — ABNORMAL HIGH (ref 70–99)
Glucose-Capillary: 154 mg/dL — ABNORMAL HIGH (ref 70–99)
Glucose-Capillary: 160 mg/dL — ABNORMAL HIGH (ref 70–99)
Glucose-Capillary: 93 mg/dL (ref 70–99)

## 2022-07-12 LAB — BASIC METABOLIC PANEL
Anion gap: 11 (ref 5–15)
BUN: 28 mg/dL — ABNORMAL HIGH (ref 6–20)
CO2: 29 mmol/L (ref 22–32)
Calcium: 9 mg/dL (ref 8.9–10.3)
Chloride: 98 mmol/L (ref 98–111)
Creatinine, Ser: 1.19 mg/dL — ABNORMAL HIGH (ref 0.44–1.00)
GFR, Estimated: 53 mL/min — ABNORMAL LOW (ref >60)
Glucose, Bld: 116 mg/dL — ABNORMAL HIGH (ref 70–99)
Potassium: 3.4 mmol/L — ABNORMAL LOW (ref 3.5–5.1)
Sodium: 138 mmol/L (ref 135–145)

## 2022-07-12 MED ORDER — POTASSIUM CHLORIDE CRYS ER 20 MEQ PO TBCR
40.0000 meq | EXTENDED_RELEASE_TABLET | Freq: Every day | ORAL | Status: DC
  Administered 2022-07-13 – 2022-07-14 (×2): 40 meq via ORAL
  Filled 2022-07-12 (×2): qty 2

## 2022-07-12 MED ORDER — POTASSIUM CHLORIDE CRYS ER 20 MEQ PO TBCR
20.0000 meq | EXTENDED_RELEASE_TABLET | Freq: Once | ORAL | Status: AC
  Administered 2022-07-12: 20 meq via ORAL
  Filled 2022-07-12: qty 1

## 2022-07-12 NOTE — Progress Notes (Signed)
   07/12/22 2300  BiPAP/CPAP/SIPAP  $ Non-Invasive Home Ventilator  Initial  $ Face Mask Medium Yes  BiPAP/CPAP/SIPAP Pt Type Adult  BiPAP/CPAP/SIPAP Resmed (CPAP)  Mask Type Full face mask  Mask Size Medium  Set Rate 0 breaths/min  Respiratory Rate 16 breaths/min  EPAP 12 cmH2O (Ramped from 4cmH20)  Flow Rate 2 lpm  Patient Home Equipment No  Auto Titrate No  CPAP/SIPAP surface wiped down Yes

## 2022-07-12 NOTE — Progress Notes (Signed)
While bathing patient denture cup fell out of the patient's purse; 6 packets of Buprenorphine HCL Film and several loose unidentifiable pills found inside of the denture cup. Charge nurse present. Educated patient on the importance of medication safety. Patient agreeable to storing medications in pharmacy. Medications delivered to pharmacy.

## 2022-07-12 NOTE — TOC Initial Note (Addendum)
Transition of Care [facility]) - Initial/Assessment Note    Patient Details  Name: [name] MRN: [id number] Date of Birth: 29-Oct-1965  Transition of Care [facility]) CM/SW Contact:    Howell Rucks, RN Phone Number: 07/12/2022, 10:06 AM  Clinical Narrative:  Met with pt at bedside to introduce role of TOC/NCM and review for dc needs PT- recommendation for short term rehab SNF, pt agreeable, prefers [facility]. NCM outreached to [facility] rep-Kia, informed of pt's preference, Kia states she will check with her Director for bed offer, await call back from facility. TOC will continue to follow.     -10:23am Text received from Kia with [facility], accepted with bed offer made. TOC will continue to follow.    -12:23pm [employer] [facility] auth initiated via Reola Calkins ID [id number], await approval.     -3:08pm  SNF auth approved per [employer]. Auth# [id number], approved 07/12/22 to 07/16/22. Team notified.                   Patient Goals and CMS Choice            Expected Discharge Plan and Services                                              Prior Living Arrangements/Services                       Activities of Daily Living Home Assistive Devices/Equipment: Eyeglasses, Blood pressure cuff, Dentures (specify type), Shower chair with back, Raised toilet seat with rails ADL Screening (condition at time of admission) Patient's cognitive ability adequate to safely complete daily activities?: No Is the patient deaf or have difficulty hearing?: No Does the patient have difficulty seeing, even when wearing glasses/contacts?: No Does the patient have difficulty concentrating, remembering, or making decisions?: No Patient able to express need for assistance with ADLs?: Yes Does the patient have difficulty dressing or bathing?: Yes Independently performs ADLs?: No Communication: Independent Dressing (OT): Needs assistance Is this a change from baseline?:  Pre-admission baseline Grooming: Needs assistance Is this a change from baseline?: Pre-admission baseline Feeding: Independent Bathing: Needs assistance Is this a change from baseline?: Pre-admission baseline Toileting: Needs assistance Is this a change from baseline?: Pre-admission baseline In/Out Bed: Needs assistance Is this a change from baseline?: Pre-admission baseline Walks in Home: Needs assistance Is this a change from baseline?: Pre-admission baseline Does the patient have difficulty walking or climbing stairs?: Yes Weakness of Legs: Right Weakness of Arms/Hands: Left  Permission Sought/Granted                  Emotional Assessment              Admission diagnosis:  Acute on chronic diastolic congestive heart failure (HCC) [I50.33] Acute on chronic respiratory failure with hypoxia (HCC) [J96.21] Chronic right-sided low back pain with right-sided sciatica [M54.41, G89.29] CHF (congestive heart failure) (HCC) [I50.9] Patient Active Problem List   Diagnosis Date Noted   CHF (congestive heart failure) (HCC) 07/11/2022   Acute on chronic diastolic congestive heart failure (HCC) 07/10/2022   Acute on chronic heart failure with preserved ejection fraction (HFpEF) (HCC) 06/09/2022   Acute exacerbation of CHF (congestive heart failure) (HCC) 06/09/2022   Panniculitis 12/04/2021   Infection of lip 08/27/2021   Lumbar stenosis with neurogenic claudication 06/29/2021  Lumbar radiculopathy 06/29/2021   Biceps tendinopathy of right upper extremity 05/03/2021   Irregular heartbeat 04/11/2021   Neuropathic pain of foot 03/28/2021   Chronic congestion of paranasal sinus 03/28/2021   Impingement syndrome of right shoulder 03/08/2021   Arthritis of right acromioclavicular joint 03/08/2021   Acute medial meniscus tear of right knee 03/08/2021   Acute lateral meniscus tear of right knee 03/08/2021   Spinal stenosis of thoracic region with radiculopathy    Bilateral leg  numbness    Spinal stenosis of lumbar region with neurogenic claudication    Primary osteoarthritis of right knee    Impaired ambulation 01/10/2021   Status post lumbar laminectomy 10/03/2020   Cubital tunnel syndrome on left 05/02/2020   Vaginal discharge 01/20/2020   Prolapse of anterior vaginal wall 11/10/2019   Skin ulcer of left thigh, limited to breakdown of skin (HCC) 09/09/2019   Ulnar nerve compression 08/06/2019   Diabetic neuropathy associated with type 2 diabetes mellitus (HCC) 08/06/2019   Body mass index 50.0-59.9, adult (HCC) 06/12/2019   Impingement syndrome of left shoulder 05/07/2019   Polyp of colon    Polyp of ascending colon    Rectal discomfort 02/12/2019   Osteoarthritis of right hip 02/12/2019   Nontraumatic tear of left supraspinatus tendon 12/30/2018   Gout 12/17/2018   Nontraumatic complete tear of right rotator cuff 12/08/2018   Rotator cuff tear, right 12/01/2018   Tobacco abuse 08/21/2018   Right groin pain 07/20/2018   Depressed mood 07/13/2018   Paroxysmal SVT (supraventricular tachycardia) 03/20/2018   Pure hypercholesterolemia 03/20/2018   Muscle cramping 02/25/2018   Nodule of upper lobe of right lung 02/13/2018   Falls, subsequent encounter 01/30/2018   Cervical post-laminectomy syndrome 11/13/2017   Pain in left foot 11/04/2017   Essential hypertension 10/27/2017   Solitary pulmonary nodule 10/07/2017   Restrictive lung disease secondary to obesity 10/01/2017   Polycythemia, secondary 10/01/2017   Chronic viral hepatitis B without delta-agent (HCC) 07/08/2017   COPD (chronic obstructive pulmonary disease) with chronic bronchitis 06/27/2017   Chronic kidney disease (CKD), stage IV (severe) (HCC) 01/14/2017   Decubitus ulcer 12/13/2016   Type 2 diabetes mellitus with stage 3 chronic kidney disease, with long-term current use of insulin (HCC) 12/12/2016   Urge incontinence of urine 12/05/2016   Trigger finger, left index finger 07/15/2016    Back pain 04/07/2014   Morbid obesity (HCC) 10/02/2012   Chronic diastolic CHF (congestive heart failure) (HCC) 04/07/2012   Skin lesion of scalp 03/15/2011   GERD 01/26/2010   Obstructive sleep apnea treated with BiPAP 02/03/2008   FIBROCYSTIC BREAST DISEASE 10/28/2006   Hyperlipidemia 03/27/2006   Obesity hypoventilation syndrome (HCC) 03/27/2006   Depression with anxiety 03/27/2006   PCP:  Jerre Simon, MD Pharmacy:   [facility] - [place], [place] - [address] [place] [address] Phone: [phone] Fax: [phone]  [id number] - [place], [place] - [address] AT [facility] [address] [place] [address] Phone: [phone] Fax: [phone]     Social Determinants of Health (SDOH) Social History: SDOH Screenings   Food Insecurity: No Food Insecurity (07/11/2022)  Housing: Low Risk  (07/11/2022)  Transportation Needs: No Transportation Needs (07/11/2022)  Recent Concern: Transportation Needs - Unmet Transportation Needs (06/09/2022)  Utilities: Not At Risk (07/11/2022)  Alcohol Screen: Low Risk  (04/02/2022)  Depression (PHQ2-9): Low Risk  (04/02/2022)  Financial Resource Strain: Low Risk  (06/06/2022)  Physical  Activity: Inactive (06/06/2022)  Social Connections: Socially Isolated (06/06/2022)  Stress: No Stress Concern Present (06/06/2022)  Tobacco Use: High Risk (07/10/2022)   SDOH Interventions:     Readmission Risk Interventions    07/12/2022   10:06 AM  Readmission Risk Prevention Plan  Transportation Screening Complete  PCP or Specialist Appt within 5-7 Days Complete  Home Care Screening Complete  Medication Review (RN CM) Complete

## 2022-07-12 NOTE — [place] FL2 (Signed)
[place] MEDICAID FL2 LEVEL OF CARE FORM     IDENTIFICATION  Patient Name: [name] Birthdate: September 14, 1965 Sex: female Admission Date (Current Location): 07/10/2022  [facility] and [place] Number:  Producer, television/film/video and Address:  [facility],  [address] [place], [place] [address]      Provider Number: [id number]  Attending Physician Name and Address:  Joseph Art, DO  Relative Name and Phone Number:  Presley Raddle [facility]) [phone]    Current Level of Care: Hospital Recommended Level of Care: Skilled Nursing Facility Prior Approval Number:    Date Approved/Denied:   PASRR Number: [id number]  Discharge Plan: SNF    Current Diagnoses: Patient Active Problem List   Diagnosis Date Noted   CHF (congestive heart failure) (HCC) 07/11/2022   Acute on chronic diastolic congestive heart failure (HCC) 07/10/2022   Acute on chronic heart failure with preserved ejection fraction (HFpEF) (HCC) 06/09/2022   Acute exacerbation of CHF (congestive heart failure) (HCC) 06/09/2022   Panniculitis 12/04/2021   Infection of lip 08/27/2021   Lumbar stenosis with neurogenic claudication 06/29/2021   Lumbar radiculopathy 06/29/2021   Biceps tendinopathy of right upper extremity 05/03/2021   Irregular heartbeat 04/11/2021   Neuropathic pain of foot 03/28/2021   Chronic congestion of paranasal sinus 03/28/2021   Impingement syndrome of right shoulder 03/08/2021   Arthritis of right acromioclavicular joint 03/08/2021   Acute medial meniscus tear of right knee 03/08/2021   Acute lateral meniscus tear of right knee 03/08/2021   Spinal stenosis of thoracic region with radiculopathy    Bilateral leg numbness    Spinal stenosis of lumbar region with neurogenic claudication    Primary osteoarthritis of right knee    Impaired ambulation 01/10/2021   Status post lumbar laminectomy 10/03/2020   Cubital tunnel syndrome on left 05/02/2020   Vaginal discharge 01/20/2020    Prolapse of anterior vaginal wall 11/10/2019   Skin ulcer of left thigh, limited to breakdown of skin (HCC) 09/09/2019   Ulnar nerve compression 08/06/2019   Diabetic neuropathy associated with type 2 diabetes mellitus (HCC) 08/06/2019   Body mass index 50.0-59.9, adult (HCC) 06/12/2019   Impingement syndrome of left shoulder 05/07/2019   Polyp of colon    Polyp of ascending colon    Rectal discomfort 02/12/2019   Osteoarthritis of right hip 02/12/2019   Nontraumatic tear of left supraspinatus tendon 12/30/2018   Gout 12/17/2018   Nontraumatic complete tear of right rotator cuff 12/08/2018   Rotator cuff tear, right 12/01/2018   Tobacco abuse 08/21/2018   Right groin pain 07/20/2018   Depressed mood 07/13/2018   Paroxysmal SVT (supraventricular tachycardia) 03/20/2018   Pure hypercholesterolemia 03/20/2018   Muscle cramping 02/25/2018   Nodule of upper lobe of right lung 02/13/2018   Falls, subsequent encounter 01/30/2018   Cervical post-laminectomy syndrome 11/13/2017   Pain in left foot 11/04/2017   Essential hypertension 10/27/2017   Solitary pulmonary nodule 10/07/2017   Restrictive lung disease secondary to obesity 10/01/2017   Polycythemia, secondary 10/01/2017   Chronic viral hepatitis B without delta-agent (HCC) 07/08/2017   COPD (chronic obstructive pulmonary disease) with chronic bronchitis 06/27/2017   Chronic kidney disease (CKD), stage IV (severe) (HCC) 01/14/2017   Decubitus ulcer 12/13/2016   Type 2 diabetes mellitus with stage 3 chronic kidney disease, with long-term current use of insulin (HCC) 12/12/2016   Urge incontinence of urine 12/05/2016   Trigger finger, left index finger 07/15/2016   Back pain 04/07/2014   Morbid obesity (  HCC) 10/02/2012   Chronic diastolic CHF (congestive heart failure) (HCC) 04/07/2012   Skin lesion of scalp 03/15/2011   GERD 01/26/2010   Obstructive sleep apnea treated with BiPAP 02/03/2008   FIBROCYSTIC BREAST DISEASE 10/28/2006    Hyperlipidemia 03/27/2006   Obesity hypoventilation syndrome (HCC) 03/27/2006   Depression with anxiety 03/27/2006    Orientation RESPIRATION BLADDER Height & Weight     Self, Time, Situation, Place  O2 External catheter Weight: 116.3 kg Height:  5\' 9"  (175.3 cm)  BEHAVIORAL SYMPTOMS/MOOD NEUROLOGICAL BOWEL NUTRITION STATUS      Continent Diet (heart healthy)  AMBULATORY STATUS COMMUNICATION OF NEEDS Skin   Limited Assist Verbally Normal                       Personal Care Assistance Level of Assistance  Bathing, Feeding, Dressing Bathing Assistance: Limited assistance Feeding assistance: Limited assistance Dressing Assistance: Limited assistance     Functional Limitations Info  Sight, Hearing, Speech Sight Info: Impaired (eyeglasses) Hearing Info: Adequate Speech Info: Adequate    SPECIAL CARE FACTORS FREQUENCY  PT (By licensed PT), OT (By licensed OT)     PT Frequency: 5/wk OT Frequency: 5x/wk            Contractures Contractures Info: Not present    Additional Factors Info  Code Status, Allergies, Psychotropic Code Status Info: Full code Allergies Info: Aspirin, Methadone Hcl, Levofloxacin Psychotropic Info: Buspar 10mg  po TID, Effexor XR 75mg  po daily         Current Medications (07/12/2022):  This is the current hospital active medication list Current Facility-Administered Medications  Medication Dose Route Frequency Provider Last Rate Last Admin   acetaminophen (TYLENOL) tablet 650 mg  650 mg Oral Q6H PRN Bobette Mo, MD   650 mg at 07/12/22 1610   Or   acetaminophen (TYLENOL) suppository 650 mg  650 mg Rectal Q6H PRN Bobette Mo, MD       atorvastatin (LIPITOR) tablet 40 mg  40 mg Oral Daily Bobette Mo, MD   40 mg at 07/12/22 9604   baclofen (LIORESAL) tablet 10 mg  10 mg Oral TID Bobette Mo, MD   10 mg at 07/12/22 5409   Buprenorphine HCl FILM 1 Film  1 Film Buccal BID Joseph Art, DO   1 Film at 07/12/22  1014   busPIRone (BUSPAR) tablet 10 mg  10 mg Oral TID Bobette Mo, MD   10 mg at 07/12/22 8119   diclofenac Sodium (VOLTAREN) 1 % topical gel 2 g  2 g Topical QID Vann, Jessica U, DO   2 g at 07/12/22 0840   diltiazem (CARDIZEM CD) 24 hr capsule 120 mg  120 mg Oral BID Bobette Mo, MD   120 mg at 07/12/22 0834   empagliflozin (JARDIANCE) tablet 10 mg  10 mg Oral Daily Bobette Mo, MD   10 mg at 07/12/22 0836   enoxaparin (LOVENOX) injection 40 mg  40 mg Subcutaneous Q24H Bobette Mo, MD   40 mg at 07/11/22 2147   fluticasone furoate-vilanterol (BREO ELLIPTA) 100-25 MCG/ACT 1 puff  1 puff Inhalation Daily Bobette Mo, MD   1 puff at 07/12/22 0810   And   umeclidinium bromide (INCRUSE ELLIPTA) 62.5 MCG/ACT 1 puff  1 puff Inhalation Daily Bobette Mo, MD   1 puff at 07/12/22 0813   furosemide (LASIX) injection 40 mg  40 mg Intravenous BID Bobette Mo, MD  40 mg at 07/12/22 0832   gabapentin (NEURONTIN) capsule 300 mg  300 mg Oral BID WC Bobette Mo, MD   300 mg at 07/12/22 1610   gabapentin (NEURONTIN) capsule 900 mg  900 mg Oral QHS Bobette Mo, MD   900 mg at 07/11/22 2134   insulin aspart (novoLOG) injection 0-20 Units  0-20 Units Subcutaneous TID WC Bobette Mo, MD   4 Units at 07/11/22 1652   ipratropium-albuterol (DUONEB) 0.5-2.5 (3) MG/3ML nebulizer solution 3 mL  3 mL Nebulization Q4H PRN Bobette Mo, MD       linaclotide Karlene Einstein) capsule 72 mcg  72 mcg Oral QAC breakfast Bobette Mo, MD   72 mcg at 07/12/22 0836   loratadine (CLARITIN) tablet 10 mg  10 mg Oral Daily Bobette Mo, MD   10 mg at 07/12/22 0834   losartan (COZAAR) tablet 25 mg  25 mg Oral QHS Bobette Mo, MD   25 mg at 07/11/22 2137   montelukast (SINGULAIR) tablet 10 mg  10 mg Oral QHS Bobette Mo, MD   10 mg at 07/11/22 2138   nystatin (MYCOSTATIN) 100000 UNIT/ML suspension 500,000 Units  5 mL Oral QID PRN  Bobette Mo, MD       ondansetron [facility]) tablet 4 mg  4 mg Oral Q6H PRN Bobette Mo, MD       Or   ondansetron [facility]) injection 4 mg  4 mg Intravenous Q6H PRN Bobette Mo, MD       Oral care mouth rinse  15 mL Mouth Rinse 4 times per day Bobette Mo, MD   15 mL at 07/12/22 9604   Oral care mouth rinse  15 mL Mouth Rinse PRN Bobette Mo, MD       oxyCODONE (Oxy IR/ROXICODONE) immediate release tablet 5 mg  5 mg Oral Q4H PRN Bobette Mo, MD   5 mg at 07/11/22 1650   pantoprazole (PROTONIX) EC tablet 40 mg  40 mg Oral Daily Bobette Mo, MD   40 mg at 07/12/22 0834   potassium chloride SA (KLOR-CON M) CR tablet 20 mEq  20 mEq Oral Daily Bobette Mo, MD   20 mEq at 07/12/22 0832   venlafaxine XR (EFFEXOR-XR) 24 hr capsule 75 mg  75 mg Oral Q breakfast Bobette Mo, MD   75 mg at 07/12/22 5409     Discharge Medications: Please see discharge summary for a list of discharge medications.  Relevant Imaging Results:  Relevant Lab Results:   Additional Information [id number]  Howell Rucks, RN

## 2022-07-12 NOTE — Progress Notes (Signed)
PROGRESS NOTE    [name]  ZOX:[id number] DOB: 13-Aug-1965 DOA: 07/10/2022 PCP: Jerre Simon, MD    Brief Narrative:   [name] is a 57 y.o. female with medical history significant of alcohol abuse in remission, asthma, bipolar disorder, chronic diastolic CHF, COPD, chronic hypoxemic respiratory failure, stage III CKD, type 2 diabetes, depression/GAD, fibrocystic breast disease, GERD, positive hep B surface antigen, condyloma acuminatum, hyperlipidemia, hypertension, incomplete LBBB, lung nodule, osteoarthritis OSA on CPAP, polyneuropathy, polysubstance abuse, PSVT, spinal stenosis associated with lower extremities spasms who was discharged from rehab 2 weeks ago after she spent about a week there due to physical deconditioning following a 5-day hospitalization from 06/09/2022 until 06/14/2022 in the setting of acute diastolic heart failure who is returning to the emergency department due to dyspnea with an O2 sat in the 80s when EMS arrived to her home and lower spine back pain for the past 2 weeks after having a fall.  She has been mostly in bed for the past 2 weeks.    Assessment and Plan: Acute on chronic diastolic congestive heart failure (HCC) Supplemental oxygen as needed. -furosemide 40 mg IVP twice daily. -Continue losartan Monitor daily weights, intake and output. -daily labs     Back pain Continue baclofen 10 mg p.o. 3 times daily. Continue buprenorphine 1 film p.o. twice daily. Oxycodone 5 mg every 4 hours as needed for pain. Continue gabapentin 300 mg p.o. twice daily and 900 mg at bedtime. MRI done: per NS: She's established with Dr. Jordan Likes. There's not anything to be done acutely, but she should follow up with him as an outpatient. There are noticeable changes since her last imaging in clinic and he will need to go over these with her.      Hyperlipidemia Continue atorvastatin      Depression with anxiety Continue buspirone 10 mg p.o. 3 times daily. Continue  Effexor 75 mg p.o. daily.     Obstructive sleep apnea treated with BiPAP Continue BiPAP at bedtime.     Type 2 diabetes mellitus with stage 3 chronic kidney disease,  without long-term current use of insulin (HCC) HbA1c 6.6% last month. Carbohydrate modified diet. Continue Jardiance 10 mg p.o. daily. -SSI    COPD (chronic obstructive pulmonary disease) with chronic bronchitis Short acting beta agonist as needed. Continue loratadine 10 mg p.o. daily. Continue montelukast 10 mg p.o. daily. Smoking cessation advised.     Essential hypertension -resume home meds   Obesity Estimated body mass index is 37.86 kg/m as calculated from the following:   Height as of this encounter: 5\' 9"  (1.753 m).   Weight as of this encounter: 116.3 kg.     Tobacco abuse Tobacco cessation advised. Nicotine replacement therapy order as needed.     Diabetic neuropathy associated with type 2 diabetes mellitus (HCC) Continue gabapentin as above.   PT/OT eval-- suspect will need to return to SNF   DVT prophylaxis: enoxaparin (LOVENOX) injection 40 mg Start: 07/10/22 2200    Code Status: Full Code   Disposition Plan:  Level of care: Telemetry Status is: Inpatient Remains inpatient appropriate     Consultants:  NS (chat)   Subjective: Feels less swollen  Objective: Vitals:   07/11/22 2138 07/12/22 0528 07/12/22 0534 07/12/22 0810  BP: 107/80 121/85    Pulse: 73 82    Resp: 18 20    Temp: 98.6 F (37 C) 98.4 F (36.9 C)    TempSrc: Oral Oral    SpO2: 93% 93%  93%  Weight:   116.3 kg   Height:        Intake/Output Summary (Last 24 hours) at 07/12/2022 1200 Last data filed at 07/12/2022 0900 Gross per 24 hour  Intake 510 ml  Output 1300 ml  Net -790 ml   Filed Weights   07/11/22 0156 07/12/22 0534  Weight: 116.2 kg 116.3 kg    Examination:   General: Appearance:    Obese female in no acute distress     Lungs:     respirations unlabored, diminished  Heart:    Normal  heart rate. Normal rhythm. No murmurs, rubs, or gallops.   MS:   All extremities are intact.   Neurologic:   Awake, alert       Data Reviewed: I have personally reviewed following labs and imaging studies  CBC: Recent Labs  Lab 07/10/22 1110 07/11/22 0431 07/12/22 0402  WBC 8.5 9.7 14.8*  NEUTROABS 5.4  --   --   HGB 17.1* 16.4* 17.5*  HCT 50.2* 49.6* 52.2*  MCV 73.7* 75.0* 75.2*  PLT 333 336 354   Basic Metabolic Panel: Recent Labs  Lab 07/10/22 1110 07/11/22 0431 07/12/22 0402  NA 136 137 138  K 3.9 3.9 3.4*  CL 99 98 98  CO2 25 26 29   GLUCOSE 159* 125* 116*  BUN 25* 27* 28*  CREATININE 1.30* 1.24* 1.19*  CALCIUM 8.8* 8.9 9.0   GFR: Estimated Creatinine Clearance: 70.1 mL/min (A) (by C-G formula based on SCr of 1.19 mg/dL (H)). Liver Function Tests: Recent Labs  Lab 07/11/22 0431  AST 16  ALT 22  ALKPHOS 85  BILITOT 0.7  PROT 6.7  ALBUMIN 3.1*   No results for input(s): "LIPASE", "AMYLASE" in the last 168 hours. No results for input(s): "AMMONIA" in the last 168 hours. Coagulation Profile: No results for input(s): "INR", "PROTIME" in the last 168 hours. Cardiac Enzymes: No results for input(s): "CKTOTAL", "CKMB", "CKMBINDEX", "TROPONINI" in the last 168 hours. BNP (last 3 results) No results for input(s): "PROBNP" in the last 8760 hours. HbA1C: No results for input(s): "HGBA1C" in the last 72 hours. CBG: Recent Labs  Lab 07/11/22 1200 07/11/22 1628 07/11/22 2137 07/12/22 0746 07/12/22 1146  GLUCAP 132* 165* 132* 120* 154*   Lipid Profile: No results for input(s): "CHOL", "HDL", "LDLCALC", "TRIG", "CHOLHDL", "LDLDIRECT" in the last 72 hours. Thyroid Function Tests: No results for input(s): "TSH", "T4TOTAL", "FREET4", "T3FREE", "THYROIDAB" in the last 72 hours. Anemia Panel: No results for input(s): "VITAMINB12", "FOLATE", "FERRITIN", "TIBC", "IRON", "RETICCTPCT" in the last 72 hours. Sepsis Labs: No results for input(s): "PROCALCITON",  "LATICACIDVEN" in the last 168 hours.  Recent Results (from the past 240 hour(s))  MRSA Next Gen by PCR, Nasal     Status: Abnormal   Collection Time: 07/11/22  2:49 AM   Specimen: Nasal Mucosa; Nasal Swab  Result Value Ref Range Status   MRSA by PCR Next Gen DETECTED (A) NOT DETECTED Final    Comment: (NOTE) The GeneXpert MRSA Assay (FDA approved for NASAL specimens only), is one component of a comprehensive MRSA colonization surveillance program. It is not intended to diagnose MRSA infection nor to guide or monitor treatment for MRSA infections. Test performance is not FDA approved in patients less than 46 years old. Performed at [facility], [address], [place], [place] [address]          Radiology Studies: MR Lumbar Spine W Wo Contrast  Result Date: 07/10/2022 CLINICAL DATA:  Low  back pain, cauda equina syndrome suspected. EXAM: MRI LUMBAR SPINE WITHOUT AND WITH CONTRAST TECHNIQUE: Multiplanar and multiecho pulse sequences of the lumbar spine were obtained without and with intravenous contrast. CONTRAST:  10mL GADAVIST GADOBUTROL 1 MMOL/ML IV SOLN COMPARISON:  Lumbar spine MRI 07/18/2020. CT lumbar spine 05/25/2021. FINDINGS: Segmentation: Conventional numbering is assumed with 5 non-rib-bearing, lumbar type vertebral bodies. Alignment:  Normal. Vertebrae: Interval postoperative changes of extension of interbody and posterior spinal fusion cranially to L2-3. Unchanged postoperative appearance from prior L3-S1 interbody and posterior spinal fusion with laminectomies extending from at least T12-L2 and L4-S1. Evolving osteonecrotic cleft at the L1-2 disc space with fluid, but no associated enhancement or endplate destruction. Conus medullaris and cauda equina: Conus extends to the L2 level. Conus and cauda equina appear normal. Paraspinal and other soft tissues: Unremarkable. Disc levels: Severe bilateral neural foraminal narrowing at L1-2. No spinal canal stenosis  in the lumbar spine. IMPRESSION: 1. Interval postoperative changes of extension of interbody and posterior spinal fusion cranially to L2-3. 2. Evolving osteonecrotic cleft at the L1-2 disc space with fluid, but no associated enhancement or endplate destruction. 3. Severe bilateral neural foraminal narrowing at L1-2. 4. No spinal canal stenosis in the lumbar spine. Electronically Signed   By: Orvan Falconer M.D.   On: 07/10/2022 16:17   DG Lumbar Spine Complete  Result Date: 07/10/2022 CLINICAL DATA:  back pain EXAM: LUMBAR SPINE - COMPLETE 4 VIEW COMPARISON:  CT L Spine 09/24/21, CXR L SPine 01/31/22 FINDINGS: Status post L1-L5 posterior spinal fusion. Hardware is intact. There is solid osseous fusion at the L2-L5 vertebral bodies. There is no evidence of osseous fusion at the L1-L2 disc space. Sclerotic appearance of the L1 vertebral body is nonspecific. There also irregular endplate changes of the superior endplate of L1 and inferior endplate of T 13. Assessment for possibility of acute traumatic injury is limited due to the degree of osteopenia. IMPRESSION: 1. Status post L1-L5 posterior spinal fusion. Hardware is intact. 2. Sclerotic appearance of the L1 vertebral body with irregular endplate changes of the superior endplate of L1 and inferior endplate of T13. Recommend further evaluation with a CT or a contrast-enhanced lumbar spine MRI to exclude the possibility of infection. These findings are new compared to 01/31/2022. Electronically Signed   By: Lorenza Cambridge M.D.   On: 07/10/2022 14:21   DG Chest Port 1 View  Result Date: 07/10/2022 CLINICAL DATA:  Back pain EXAM: PORTABLE CHEST 1 VIEW COMPARISON:  06/09/2022 FINDINGS: Examination is limited by significant patient rotation. Cardiac silhouette is enlarged. No confluent airspace opacity. No pleural effusion or pneumothorax. Cervicothoracic fusion hardware is partially imaged. IMPRESSION: Cardiomegaly. No confluent airspace opacity. Electronically  Signed   By: Duanne Guess D.O.   On: 07/10/2022 14:19        Scheduled Meds:  atorvastatin  40 mg Oral Daily   baclofen  10 mg Oral TID   Buprenorphine HCl  1 Film Buccal BID   busPIRone  10 mg Oral TID   diclofenac Sodium  2 g Topical QID   diltiazem  120 mg Oral BID   empagliflozin  10 mg Oral Daily   enoxaparin (LOVENOX) injection  40 mg Subcutaneous Q24H   fluticasone furoate-vilanterol  1 puff Inhalation Daily   And   umeclidinium bromide  1 puff Inhalation Daily   furosemide  40 mg Intravenous BID   gabapentin  300 mg Oral BID WC   gabapentin  900 mg Oral QHS   insulin aspart  0-20 Units Subcutaneous TID WC   linaclotide  72 mcg Oral QAC breakfast   loratadine  10 mg Oral Daily   losartan  25 mg Oral QHS   montelukast  10 mg Oral QHS   mouth rinse  15 mL Mouth Rinse 4 times per day   pantoprazole  40 mg Oral Daily   potassium chloride  20 mEq Oral Once   [START ON 07/13/2022] potassium chloride SA  40 mEq Oral Daily   venlafaxine XR  75 mg Oral Q breakfast   Continuous Infusions:   LOS: 1 day    Time spent: 45 minutes spent on chart review, discussion with nursing staff, consultants, updating family and interview/physical exam; more than 50% of that time was spent in counseling and/or coordination of care.    Joseph Art, DO [facility] Available via Epic secure chat 7am-7pm After these hours, please refer to coverage provider listed on [email or web address] 07/12/2022, 12:00 PM

## 2022-07-13 ENCOUNTER — Inpatient Hospital Stay (HOSPITAL_COMMUNITY): Payer: 59

## 2022-07-13 DIAGNOSIS — I5033 Acute on chronic diastolic (congestive) heart failure: Secondary | ICD-10-CM | POA: Diagnosis not present

## 2022-07-13 LAB — BASIC METABOLIC PANEL
Anion gap: 10 (ref 5–15)
BUN: 26 mg/dL — ABNORMAL HIGH (ref 6–20)
CO2: 29 mmol/L (ref 22–32)
Calcium: 8.8 mg/dL — ABNORMAL LOW (ref 8.9–10.3)
Chloride: 101 mmol/L (ref 98–111)
Creatinine, Ser: 1.01 mg/dL — ABNORMAL HIGH (ref 0.44–1.00)
GFR, Estimated: >60 mL/min (ref >60)
Glucose, Bld: 94 mg/dL (ref 70–99)
Potassium: 3.4 mmol/L — ABNORMAL LOW (ref 3.5–5.1)
Sodium: 140 mmol/L (ref 135–145)

## 2022-07-13 LAB — CBC
HCT: 51.5 % — ABNORMAL HIGH (ref 36.0–46.0)
Hemoglobin: 17.1 g/dL — ABNORMAL HIGH (ref 12.0–15.0)
MCH: 24.9 pg — ABNORMAL LOW (ref 26.0–34.0)
MCHC: 33.2 g/dL (ref 30.0–36.0)
MCV: 75.1 fL — ABNORMAL LOW (ref 80.0–100.0)
Platelets: 334 10*3/uL (ref 150–400)
RBC: 6.86 MIL/uL — ABNORMAL HIGH (ref 3.87–5.11)
RDW: 21.5 % — ABNORMAL HIGH (ref 11.5–15.5)
WBC: 12.2 10*3/uL — ABNORMAL HIGH (ref 4.0–10.5)
nRBC: 0 % (ref 0.0–0.2)

## 2022-07-13 LAB — GLUCOSE, CAPILLARY
Glucose-Capillary: 93 mg/dL (ref 70–99)
Glucose-Capillary: 127 mg/dL — ABNORMAL HIGH (ref 70–99)
Glucose-Capillary: 132 mg/dL — ABNORMAL HIGH (ref 70–99)

## 2022-07-13 MED ORDER — FUROSEMIDE 40 MG PO TABS
80.0000 mg | ORAL_TABLET | Freq: Two times a day (BID) | ORAL | Status: DC
  Administered 2022-07-13 (×2): 80 mg via ORAL
  Filled 2022-07-13 (×2): qty 2

## 2022-07-13 MED ORDER — INSULIN ASPART 100 UNIT/ML IJ SOLN: 0.0000 [IU] | Freq: Every day | INTRAMUSCULAR | Status: DC

## 2022-07-13 MED ORDER — INSULIN ASPART 100 UNIT/ML IJ SOLN
0.0000 [IU] | Freq: Three times a day (TID) | INTRAMUSCULAR | Status: DC
  Administered 2022-07-14: 1 [IU] via SUBCUTANEOUS
  Administered 2022-07-14: 2 [IU] via SUBCUTANEOUS

## 2022-07-13 MED ORDER — FUROSEMIDE 40 MG PO TABS
80.0000 mg | ORAL_TABLET | Freq: Two times a day (BID) | ORAL | Status: DC
  Administered 2022-07-14: 80 mg via ORAL
  Filled 2022-07-13: qty 2

## 2022-07-13 MED ORDER — NITROGLYCERIN 0.4 MG SL SUBL: 0.4000 mg | SUBLINGUAL_TABLET | SUBLINGUAL | Status: DC | PRN

## 2022-07-13 MED ORDER — BACLOFEN 10 MG PO TABS: 10.0000 mg | ORAL_TABLET | Freq: Three times a day (TID) | ORAL | 0 refills | Status: AC

## 2022-07-13 MED ORDER — BELBUCA 900 MCG BU FILM: 1.0000 | ORAL_FILM | Freq: Two times a day (BID) | BUCCAL | 0 refills | Status: AC

## 2022-07-13 NOTE — Progress Notes (Signed)
PROGRESS NOTE  LUX MCCUBBINS  DOB: 1965-12-18  PCP: Jerre Simon, MD ZOX:[id number]  DOA: 07/10/2022  LOS: 2 days  Hospital Day: 4  Brief narrative: [name] is a 57 y.o. female with PMH significant for alcohol abuse in remission, chronic smoking, polysubstance abuse, obesity, OSA, COPD on home oxygen, DM2, HTN, HLD, CHF, bipolar disorder/anxiety/depression, polyneuropathy, chronic back pain s/p multiple back surgeries. Most recent hospitalization 5/12 to 5/17 for fall, acute diastolic CHF, discharged to rehab where she spent a week and was discharged to home. At home, patient continued to remain weak.  Was bedbound for about 2 weeks. 6/12, EMS was called because of dyspnea.  Noted low O2 sat at 81% and brought her to ED.  Workup in the ED showed elevated BNP to more than 1300 Chest x-ray showed cardiomegaly Lumbar spine x-ray showed s/p L1-L5 posterior spinal fusion with intact hardware Admitted to [facility]  Subjective: Patient was seen and examined this morning.  Lying on bed.  Not in distress.  No new symptoms.  Volume status looks stable.  Bilirubin improved. Chart reviewed Hemodynamically stable, on 4 L oxygen Most recent labs from this morning with WC count 12.2, hemoglobin 17.1 BUN/creatinine 26/1.1, potassium 3.4  Assessment and plan: Acute on chronic diastolic congestive heart failure  Essential hypertension Presented with shortness of breath, hypoxia Most recent echo from 06/10/2022 showed EF 60 to 65%, no WMA.  Prior echo had shown diastolic dysfunction. Currently undergoing diuresis with IV Lasix 40 mg twice daily. Volume status seems improved.  Previously on Lasix 80 mg twice daily.  Switch to oral Lasix today.  Repeat labs tomorrow.  If stable, plan to discharge to rehab tomorrow. Also continue Cardizem 120 mg twice daily, losartan 25 mg daily and Jardiance 10 mg daily. Net IO Since Admission: -970 mL [07/13/22 1036] Continue to monitor for daily intake output,  weight, blood pressure, BNP, renal function and electrolytes. Recent Labs  Lab 07/10/22 1108 07/10/22 1110 07/11/22 0431 07/12/22 0402 07/13/22 0536  BNP 1,367.6*  --   --   --   --   BUN  --  25* 27* 28* 26*  CREATININE  --  1.30* 1.24* 1.19* 1.01*  NA  --  136 137 138 140  K  --  3.9 3.9 3.4* 3.4*   Hypokalemia Potassium level low at 3.4.  Replacement given.  Update magnesium and phosphorus level as well Recent Labs  Lab 07/10/22 1110 07/11/22 0431 07/12/22 0402 07/13/22 0536  K 3.9 3.9 3.4* 3.4*   Type 2 diabetes mellitus A1c 6.6 on 06/10/2022 PTA on Jardiance 10 mg daily Continue the same. Recent Labs  Lab 07/12/22 0746 07/12/22 1146 07/12/22 1828 07/12/22 2055 07/13/22 0939  GLUCAP 120* 154* 160* 93 93   Acute on chronic back pain s/p multiple prior surgeries Polyneuropathy Impaired mobility and falls Because of acute x-ray h/o chronic back pain, MRI lumbar spine was done on 6/12.  It showed interval postoperative changes, severe bilateral neuroforaminal narrowing at L1-L2.  Previous hospitalist discussed the case with neurosurgeon Dr. Jordan Likes.  No acute intervention recommended. Seen by PT.  SNF recommended Pain control with baclofen 10 mg p.o. 3 times daily, buprenorphine 1 film p.o. twice daily, Oxycodone 5 mg every 4 hours PRN, gabapentin 300 mg p.o. twice daily and 900 mg at bedtime.  Anxiety/depression Bipolar disorder Continue BuSpar 10 mg 3 times daily, Effexor 75 mg daily  Hyperlipidemia Continue atorvastatin    COPD OSA Chronic daily smoker Continue BiPAP at  bedtime. Continue bronchodilators, Singulair Counseled to quit smoking  Morbid Obesity  Body mass index is 37.47 kg/m. Patient has been advised to make an attempt to improve diet and exercise patterns to aid in weight loss.  Goals of care   Code Status: Full Code    DVT prophylaxis:  enoxaparin (LOVENOX) injection 40 mg Start: 07/10/22 2200   Antimicrobials: None Fluid:  None Consultants: None Family Communication: None at bedside  Status: Inpatient Level of care:  Telemetry   Patient from: Home Anticipated d/c to: SNF Needs to continue in-hospital care:  Ongoing diuresis.  Plan to discharge to SNF tomorrow    Diet:  Diet Order             Diet heart healthy/carb modified Room service appropriate? Yes; Fluid consistency: Thin  Diet effective now                   Scheduled Meds:  atorvastatin  40 mg Oral Daily   baclofen  10 mg Oral TID   Buprenorphine HCl  1 Film Buccal BID   busPIRone  10 mg Oral TID   diclofenac Sodium  2 g Topical QID   diltiazem  120 mg Oral BID   empagliflozin  10 mg Oral Daily   enoxaparin (LOVENOX) injection  40 mg Subcutaneous Q24H   fluticasone furoate-vilanterol  1 puff Inhalation Daily   And   umeclidinium bromide  1 puff Inhalation Daily   furosemide  80 mg Oral BID   gabapentin  300 mg Oral BID WC   gabapentin  900 mg Oral QHS   insulin aspart  0-5 Units Subcutaneous QHS   insulin aspart  0-9 Units Subcutaneous TID WC   linaclotide  72 mcg Oral QAC breakfast   loratadine  10 mg Oral Daily   losartan  25 mg Oral QHS   montelukast  10 mg Oral QHS   mouth rinse  15 mL Mouth Rinse 4 times per day   pantoprazole  40 mg Oral Daily   potassium chloride SA  40 mEq Oral Daily   venlafaxine XR  75 mg Oral Q breakfast    PRN meds: acetaminophen **OR** acetaminophen, ipratropium-albuterol, nystatin, ondansetron **OR** ondansetron (ZOFRAN) IV, mouth rinse, oxyCODONE   Infusions:    Antimicrobials: Anti-infectives (From admission, onward)    None       Nutritional status:  Body mass index is 37.47 kg/m.          Objective: Vitals:   07/13/22 0757 07/13/22 0801  BP:    Pulse:    Resp:  11  Temp:    SpO2: 95%     Intake/Output Summary (Last 24 hours) at 07/13/2022 1036 Last data filed at 07/13/2022 0450 Gross per 24 hour  Intake 480 ml  Output 600 ml  Net -120 ml   Filed  Weights   07/11/22 0156 07/12/22 0534 07/13/22 0500  Weight: 116.2 kg 116.3 kg 115.1 kg   Weight change: -1.2 kg Body mass index is 37.47 kg/m.   Physical Exam: General exam: Pleasant, middle-aged African-American female.  Not in physical distress Skin: No rashes, lesions or ulcers. HEENT: Atraumatic, normocephalic, no obvious bleeding Lungs: Clear to auscultation bilaterally CVS: Regular rate and rhythm, no murmur GI/Abd soft, nontender, distended from obesity, bowel sound present CNS: Alert, awake, oriented x 3 Psychiatry: Mood appropriate Extremities: Pedal edema improving bilaterally, no calf tenderness  Data Review: I have personally reviewed the laboratory data and studies available.  F/u labs ordered  Unresulted Labs (From admission, onward)     Start     Ordered   07/12/22 0500  Basic metabolic panel  Daily,   R      07/10/22 1457            Total time spent in review of labs and imaging, patient evaluation, formulation of plan, documentation and communication with family: 55 minutes  Signed, Lorin Glass, MD [facility] 07/13/2022

## 2022-07-13 NOTE — Progress Notes (Signed)
This RN was notified by Nurse Tech that Patient O2 level was down to 76% on CPAP. Patient alert and placed her on 4L [place] O2 sat up to 91% and connected patient to continuous SPO2 monitoring at bedside, RT Luisa Hart made aware.

## 2022-07-13 NOTE — Progress Notes (Signed)
Call received form central telemetry of HR 160s. Immediately to room to assess pt. Pt was sitting on the toilet in the bathroom trying to stand. RN requested assistance from other staff. Pt very weak, c/o CP and having extreme difficult standing from sitting position. 2 person heavy assist back to bed. HR remained in the 150-160s. EKG obtained. Dr Pola Corn and Dr Hanley Ben notified. Telemetry was showing what appeared to be PSVT, then a-flutter, which converted to NSR with PACs  with only rest once in bed. Dr Hanley Ben to bedside. New orders placed by Dr Hanley Ben. Dr Hanley Ben also verbalized agreement with pt to change furosemide dosing to 0600 and 1400.

## 2022-07-13 NOTE — Progress Notes (Signed)
RT note: Notified by RN that pts. Oxygen saturation on v/s update/rounds was noted to be 76% while on CPAP(ResMed) and was taken of CPAP and placed back on nasal cannula at 4 lpm with Oxygen saturation increasing to 91%, upon my arrival this RT noted Oxygen line from second flowmeter on double set up was disconnected from blue adapter port at CPAP hose connection on unit with NT/RN at bedside This RT firmly replaced line to adapter port. Pt. was alert and remained in no apparent distress after being replaced on nasal cannula to achieve/maintain Oxygen saturations of >92%.

## 2022-07-13 NOTE — Progress Notes (Signed)
   07/13/22 2134  BiPAP/CPAP/SIPAP  Reason BIPAP/CPAP not in use Non-compliant (Pt was upset with RN and she said she doesnt wear any and dont want to wear it tonight. machine remained in the room)

## 2022-07-13 NOTE — Progress Notes (Signed)
[facility] coverage note: I was notified by the nurse that patient was complaining of chest pain and the heart rate was in the 160s.  I evaluated the patient at bedside subsequently.  Patient states that she was in the bathroom straining and had some chest pain; she is currently lying in bed and denies any chest pain.   On exam Chronically ill looking female on 4 L oxygen but alert, awake and conversant.  Blood pressure 128/90.  Pulse of 79.  Respiratory rate 11. CVS: S1-S2 heard, currently rate controlled Chest: Bilateral decreased breath sounds at bases with some scattered crackles Abdomen: Obese, soft, nontender, bowel sounds positive  Extremities: Bilateral lower extremity edema present  I have reviewed the EKG myself that was obtained just a little while ago: Normal sinus rhythm with some PACs but no ST elevations or depressions. Plan -Patient currently denies any chest pain.  EKG is nonischemic.  She has history of paroxysmal SVT and is on Cardizem twice a day; she received a dose earlier today and will be receiving another dose later this evening. -Heart rate has normalized.  Will just monitor for now. -Check chest x-ray as she is on 4 L oxygen by nasal cannula.  She is due for her afternoon dose of Lasix now. -Use sublingual nitroglycerin as needed for chest pain. -I have discussed this with the patient, nurse and also the attending physician.

## 2022-07-14 DIAGNOSIS — N183 Chronic kidney disease, stage 3 unspecified: Secondary | ICD-10-CM | POA: Diagnosis not present

## 2022-07-14 DIAGNOSIS — I10 Essential (primary) hypertension: Secondary | ICD-10-CM | POA: Diagnosis not present

## 2022-07-14 DIAGNOSIS — M549 Dorsalgia, unspecified: Secondary | ICD-10-CM | POA: Diagnosis not present

## 2022-07-14 DIAGNOSIS — J9621 Acute and chronic respiratory failure with hypoxia: Secondary | ICD-10-CM | POA: Diagnosis not present

## 2022-07-14 DIAGNOSIS — Z743 Need for continuous supervision: Secondary | ICD-10-CM | POA: Diagnosis not present

## 2022-07-14 DIAGNOSIS — E1142 Type 2 diabetes mellitus with diabetic polyneuropathy: Secondary | ICD-10-CM | POA: Diagnosis not present

## 2022-07-14 DIAGNOSIS — Z7401 Bed confinement status: Secondary | ICD-10-CM | POA: Diagnosis not present

## 2022-07-14 DIAGNOSIS — I5032 Chronic diastolic (congestive) heart failure: Secondary | ICD-10-CM | POA: Diagnosis not present

## 2022-07-14 DIAGNOSIS — Z9181 History of falling: Secondary | ICD-10-CM | POA: Diagnosis not present

## 2022-07-14 DIAGNOSIS — R52 Pain, unspecified: Secondary | ICD-10-CM | POA: Diagnosis not present

## 2022-07-14 DIAGNOSIS — M8788 Other osteonecrosis, other site: Secondary | ICD-10-CM | POA: Diagnosis not present

## 2022-07-14 DIAGNOSIS — K5900 Constipation, unspecified: Secondary | ICD-10-CM | POA: Diagnosis not present

## 2022-07-14 DIAGNOSIS — J302 Other seasonal allergic rhinitis: Secondary | ICD-10-CM | POA: Diagnosis not present

## 2022-07-14 DIAGNOSIS — R058 Other specified cough: Secondary | ICD-10-CM | POA: Diagnosis not present

## 2022-07-14 DIAGNOSIS — M1611 Unilateral primary osteoarthritis, right hip: Secondary | ICD-10-CM | POA: Diagnosis not present

## 2022-07-14 DIAGNOSIS — D649 Anemia, unspecified: Secondary | ICD-10-CM | POA: Diagnosis not present

## 2022-07-14 DIAGNOSIS — R5383 Other fatigue: Secondary | ICD-10-CM | POA: Diagnosis not present

## 2022-07-14 DIAGNOSIS — N182 Chronic kidney disease, stage 2 (mild): Secondary | ICD-10-CM | POA: Diagnosis not present

## 2022-07-14 DIAGNOSIS — M961 Postlaminectomy syndrome, not elsewhere classified: Secondary | ICD-10-CM | POA: Diagnosis not present

## 2022-07-14 DIAGNOSIS — L89322 Pressure ulcer of left buttock, stage 2: Secondary | ICD-10-CM | POA: Diagnosis not present

## 2022-07-14 DIAGNOSIS — E1140 Type 2 diabetes mellitus with diabetic neuropathy, unspecified: Secondary | ICD-10-CM | POA: Diagnosis not present

## 2022-07-14 DIAGNOSIS — J4489 Other specified chronic obstructive pulmonary disease: Secondary | ICD-10-CM | POA: Diagnosis not present

## 2022-07-14 DIAGNOSIS — E1122 Type 2 diabetes mellitus with diabetic chronic kidney disease: Secondary | ICD-10-CM | POA: Diagnosis not present

## 2022-07-14 DIAGNOSIS — M5414 Radiculopathy, thoracic region: Secondary | ICD-10-CM | POA: Diagnosis not present

## 2022-07-14 DIAGNOSIS — M19011 Primary osteoarthritis, right shoulder: Secondary | ICD-10-CM | POA: Diagnosis not present

## 2022-07-14 DIAGNOSIS — M6281 Muscle weakness (generalized): Secondary | ICD-10-CM | POA: Diagnosis not present

## 2022-07-14 DIAGNOSIS — K219 Gastro-esophageal reflux disease without esophagitis: Secondary | ICD-10-CM | POA: Diagnosis not present

## 2022-07-14 DIAGNOSIS — E8809 Other disorders of plasma-protein metabolism, not elsewhere classified: Secondary | ICD-10-CM | POA: Diagnosis not present

## 2022-07-14 DIAGNOSIS — E785 Hyperlipidemia, unspecified: Secondary | ICD-10-CM | POA: Diagnosis not present

## 2022-07-14 DIAGNOSIS — E782 Mixed hyperlipidemia: Secondary | ICD-10-CM | POA: Diagnosis not present

## 2022-07-14 DIAGNOSIS — M109 Gout, unspecified: Secondary | ICD-10-CM | POA: Diagnosis not present

## 2022-07-14 DIAGNOSIS — M79675 Pain in left toe(s): Secondary | ICD-10-CM | POA: Diagnosis not present

## 2022-07-14 DIAGNOSIS — M5459 Other low back pain: Secondary | ICD-10-CM | POA: Diagnosis not present

## 2022-07-14 DIAGNOSIS — R531 Weakness: Secondary | ICD-10-CM | POA: Diagnosis not present

## 2022-07-14 DIAGNOSIS — I471 Supraventricular tachycardia, unspecified: Secondary | ICD-10-CM | POA: Diagnosis not present

## 2022-07-14 DIAGNOSIS — I5033 Acute on chronic diastolic (congestive) heart failure: Secondary | ICD-10-CM | POA: Diagnosis not present

## 2022-07-14 DIAGNOSIS — M7541 Impingement syndrome of right shoulder: Secondary | ICD-10-CM | POA: Diagnosis not present

## 2022-07-14 DIAGNOSIS — R5381 Other malaise: Secondary | ICD-10-CM | POA: Diagnosis not present

## 2022-07-14 DIAGNOSIS — M545 Low back pain, unspecified: Secondary | ICD-10-CM | POA: Diagnosis not present

## 2022-07-14 DIAGNOSIS — M25512 Pain in left shoulder: Secondary | ICD-10-CM | POA: Diagnosis not present

## 2022-07-14 DIAGNOSIS — B351 Tinea unguium: Secondary | ICD-10-CM | POA: Diagnosis not present

## 2022-07-14 DIAGNOSIS — R0601 Orthopnea: Secondary | ICD-10-CM | POA: Diagnosis not present

## 2022-07-14 DIAGNOSIS — M48062 Spinal stenosis, lumbar region with neurogenic claudication: Secondary | ICD-10-CM | POA: Diagnosis not present

## 2022-07-14 DIAGNOSIS — M25511 Pain in right shoulder: Secondary | ICD-10-CM | POA: Diagnosis not present

## 2022-07-14 DIAGNOSIS — F32A Depression, unspecified: Secondary | ICD-10-CM | POA: Diagnosis not present

## 2022-07-14 DIAGNOSIS — M79674 Pain in right toe(s): Secondary | ICD-10-CM | POA: Diagnosis not present

## 2022-07-14 DIAGNOSIS — R051 Acute cough: Secondary | ICD-10-CM | POA: Diagnosis not present

## 2022-07-14 DIAGNOSIS — G4733 Obstructive sleep apnea (adult) (pediatric): Secondary | ICD-10-CM | POA: Diagnosis not present

## 2022-07-14 DIAGNOSIS — R262 Difficulty in walking, not elsewhere classified: Secondary | ICD-10-CM | POA: Diagnosis not present

## 2022-07-14 DIAGNOSIS — G8929 Other chronic pain: Secondary | ICD-10-CM | POA: Diagnosis not present

## 2022-07-14 LAB — BASIC METABOLIC PANEL
Anion gap: 12 (ref 5–15)
BUN: 21 mg/dL — ABNORMAL HIGH (ref 6–20)
CO2: 31 mmol/L (ref 22–32)
Calcium: 9.1 mg/dL (ref 8.9–10.3)
Chloride: 100 mmol/L (ref 98–111)
Creatinine, Ser: 0.99 mg/dL (ref 0.44–1.00)
GFR, Estimated: >60 mL/min (ref >60)
Glucose, Bld: 112 mg/dL — ABNORMAL HIGH (ref 70–99)
Potassium: 3.5 mmol/L (ref 3.5–5.1)
Sodium: 143 mmol/L (ref 135–145)

## 2022-07-14 LAB — GLUCOSE, CAPILLARY
Glucose-Capillary: 127 mg/dL — ABNORMAL HIGH (ref 70–99)
Glucose-Capillary: 159 mg/dL — ABNORMAL HIGH (ref 70–99)

## 2022-07-14 MED ORDER — GABAPENTIN 100 MG PO CAPS: 100.0000 mg | ORAL_CAPSULE | Freq: Two times a day (BID) | ORAL | Status: DC

## 2022-07-14 NOTE — TOC Transition Note (Addendum)
Transition of Care [facility]) - CM/SW Discharge Note   Patient Details  Name: [name] MRN: [id number] Date of Birth: [birth year]  [facility] CM/SW Contact:  Lavenia Atlas, RN Phone Number: 07/14/2022, 11:46 AM   Clinical Narrative:   Notified Kia with [facility] of patient's current discharge status. Per chart review insurance auth approved 6/14-6/18/24, Auth# [id number] per [employer]. Sent discharge summary to [facility] via [facility]. Awaiting call back from Kia with [facility] to obtain room # and report number.   TOC will continue to follow.  - 11:52 pm This RNCM received call from [facility] w/[facility], requesting faxed a copy of dc summary.  [facility] will accept patient today, Room# 125A, report can be called to [phone]. Notified MD and RN.  TOC will continue to follow.   Final next level of care: Skilled Nursing Facility Barriers to Discharge: No Barriers Identified   Patient Goals and CMS Choice CMS Medicare.gov Compare Post Acute Care list provided to:: Patient Choice offered to / list presented to : Patient  Discharge Placement     Existing PASRR number confirmed : 07/14/22          Patient chooses bed at: [facility] Patient to be transferred to facility by: [facility]      Discharge Plan and Services Additional resources added to the After Visit Summary for                  DME Arranged: N/A DME Agency: NA       HH Arranged: NA HH Agency: NA        Social Determinants of Health (SDOH) Interventions SDOH Screenings   Food Insecurity: No Food Insecurity (07/11/2022)  Housing: Low Risk  (07/11/2022)  Transportation Needs: No Transportation Needs (07/11/2022)  Recent Concern: Transportation Needs - Unmet Transportation Needs (06/09/2022)  Utilities: Not At Risk (07/11/2022)  Alcohol Screen: Low Risk  (04/02/2022)  Depression (PHQ2-9): Low Risk  (04/02/2022)  Financial Resource Strain: Low Risk  (06/06/2022)  Physical Activity: Inactive  (06/06/2022)  Social Connections: Socially Isolated (06/06/2022)  Stress: No Stress Concern Present (06/06/2022)  Tobacco Use: High Risk (07/10/2022)     Readmission Risk Interventions    07/12/2022   10:06 AM  Readmission Risk Prevention Plan  Transportation Screening Complete  PCP or Specialist Appt within 5-7 Days Complete  Home Care Screening Complete  Medication Review (RN CM) Complete

## 2022-07-14 NOTE — TOC Progression Note (Signed)
Transition of Care [facility]) - Progression Note    Patient Details  Name: [name] MRN: [id number] Date of Birth: 23-Aug-1965  Transition of Care [facility]) CM/SW Contact  Beckie Busing, RN Phone [phone]  07/14/2022, 12:38 PM  Clinical Narrative:    Transportation has been arranged per PTAR. Medical necessity and face sheet added to d/c packet on the unit. NO Other TOC needs noted. TOC will sign off.     Barriers to Discharge: No Barriers Identified  Expected Discharge Plan and Services         Expected Discharge Date: 07/14/22               DME Arranged: N/A DME Agency: NA       HH Arranged: NA HH Agency: NA         Social Determinants of Health (SDOH) Interventions SDOH Screenings   Food Insecurity: No Food Insecurity (07/11/2022)  Housing: Low Risk  (07/11/2022)  Transportation Needs: No Transportation Needs (07/11/2022)  Recent Concern: Transportation Needs - Unmet Transportation Needs (06/09/2022)  Utilities: Not At Risk (07/11/2022)  Alcohol Screen: Low Risk  (04/02/2022)  Depression (PHQ2-9): Low Risk  (04/02/2022)  Financial Resource Strain: Low Risk  (06/06/2022)  Physical Activity: Inactive (06/06/2022)  Social Connections: Socially Isolated (06/06/2022)  Stress: No Stress Concern Present (06/06/2022)  Tobacco Use: High Risk (07/10/2022)    Readmission Risk Interventions    07/12/2022   10:06 AM  Readmission Risk Prevention Plan  Transportation Screening Complete  PCP or Specialist Appt within 5-7 Days Complete  Home Care Screening Complete  Medication Review (RN CM) Complete

## 2022-07-14 NOTE — Discharge Planning (Signed)
Called facility twice with no answer from nursing staff.

## 2022-07-14 NOTE — Discharge Summary (Signed)
Physician Discharge Summary  GORETTI DUX ZOX:[id number] DOB: 1965-09-26 DOA: 07/10/2022  PCP: Jerre Simon, MD  Admit date: 07/10/2022 Discharge date: 07/14/2022  Admitted From: Home Discharge disposition: SNF   Brief narrative: [name] is a 57 y.o. female with PMH significant for alcohol abuse in remission, chronic smoking, polysubstance abuse, obesity, OSA, COPD on home oxygen, DM2, HTN, HLD, CHF, bipolar disorder/anxiety/depression, polyneuropathy, chronic back pain s/p multiple back surgeries. Most recent hospitalization 5/12 to 5/17 for fall, acute diastolic CHF, discharged to rehab where she spent a week and was discharged to home. At home, patient continued to remain weak.  Was bedbound for about 2 weeks. 6/12, EMS was called because of dyspnea.  Noted low O2 sat at 81% and brought her to ED.  Workup in the ED showed elevated BNP to more than 1300 Chest x-ray showed cardiomegaly Lumbar spine x-ray showed s/p L1-L5 posterior spinal fusion with intact hardware Admitted to [facility]  Subjective: Patient was seen and examined this morning.  Lying on bed not in distress.  No new symptoms.  Events from yesterday afternoon noted.  Patient had episode of SVT which spontaneously resolved.  Hospital course: Acute on chronic diastolic congestive heart failure  Essential hypertension Presented with shortness of breath, hypoxia Most recent echo from 06/10/2022 showed EF 60 to 65%, no WMA.  Prior echo had shown diastolic dysfunction. Adequately diuresed with IV Lasix. Volume status seems improved.  Previously on Lasix 80 mg twice daily.  Continue the same post discharge.  Also continue Cardizem 120 mg twice daily, losartan 25 mg daily and Jardiance 10 mg daily. Net IO Since Admission: -4,100 mL [07/14/22 1127] Continue to monitor for daily intake output, weight, blood pressure, BNP, renal function and electrolytes. Recent Labs  Lab 07/10/22 1108 07/10/22 1110 07/11/22 0431  07/12/22 0402 07/13/22 0536 07/14/22 0421  BNP 1,367.6*  --   --   --   --   --   BUN  --  25* 27* 28* 26* 21*  CREATININE  --  1.30* 1.24* 1.19* 1.01* 0.99  NA  --  136 137 138 140 143  K  --  3.9 3.9 3.4* 3.4* 3.5   Hypokalemia Potassium level improved with replacement. Recent Labs  Lab 07/10/22 1110 07/11/22 0431 07/12/22 0402 07/13/22 0536 07/14/22 0421  K 3.9 3.9 3.4* 3.4* 3.5   Type 2 diabetes mellitus A1c 6.6 on 06/10/2022 PTA on Jardiance 10 mg daily Continue the same. Recent Labs  Lab 07/12/22 2055 07/13/22 0939 07/13/22 1240 07/13/22 2037 07/14/22 0738  GLUCAP 93 93 127* 132* 127*   Acute on chronic back pain s/p multiple prior surgeries Polyneuropathy Impaired mobility and falls Because of acute x-ray h/o chronic back pain, MRI lumbar spine was done on 6/12.  It showed interval postoperative changes, severe bilateral neuroforaminal narrowing at L1-L2.  Previous hospitalist discussed the case with neurosurgeon Dr. Jordan Likes.  No acute intervention recommended. Seen by PT.  SNF recommended Pain control with baclofen 10 mg p.o. 3 times daily, buprenorphine 1 film p.o. twice daily, Oxycodone 5 mg every 4 hours PRN, gabapentin 100 mg twice daily.   Patient states he was getting more sleepy than normal.  I reduced the dose of gabapentin from previous dosing at patient's request.  Anxiety/depression Bipolar disorder Continue BuSpar 10 mg 3 times daily, Effexor 75 mg daily  Hyperlipidemia Continue atorvastatin    COPD OSA Chronic daily smoker Continue BiPAP at bedtime. Continue bronchodilators, Singulair Counseled to quit smoking  Morbid  Obesity  Body mass index is 35.97 kg/m. Patient has been advised to make an attempt to improve diet and exercise patterns to aid in weight loss.  Goals of care   Code Status: Full Code   Wounds:  -    Discharge Exam:   Vitals:   07/13/22 2039 07/14/22 0500 07/14/22 0612 07/14/22 0859  BP: (!) 147/94  (!) 155/81    Pulse: 75  74   Resp:   18   Temp: 97.6 F (36.4 C)  98.7 F (37.1 C)   TempSrc: Oral  Oral   SpO2: 95%  95% 94%  Weight:  110.5 kg    Height:        Body mass index is 35.97 kg/m.   General exam: Pleasant, middle-aged African-American female.  Not in physical distress Skin: No rashes, lesions or ulcers. HEENT: Atraumatic, normocephalic, no obvious bleeding Lungs: Clear to auscultation bilaterally CVS: Regular rate and rhythm, no murmur GI/Abd soft, nontender, distended from obesity, bowel sound present CNS: Alert, awake, oriented x 3 Psychiatry: Mood appropriate Extremities: Pedal edema improving bilaterally, no calf tenderness  Follow ups:    Contact information for follow-up providers     Jerre Simon, MD Follow up.   Specialty: Family Medicine Contact information: [address] [place] [address] [phone]         Jerre Simon, MD Follow up.   Specialty: Family Medicine Contact information: [address] [place] [address] [phone]              Contact information for after-discharge care     Destination     [facility] Preferred SNF .   Service: Skilled Nursing Contact information: [address] [place] [address] [phone]                     Discharge Instructions:   Discharge Instructions     Call MD for:  difficulty breathing, headache or visual disturbances   Complete by: As directed    Call MD for:  extreme fatigue   Complete by: As directed    Call MD for:  hives   Complete by: As directed    Call MD for:  persistant dizziness or light-headedness   Complete by: As directed    Call MD for:  persistant nausea and vomiting   Complete by: As directed    Call MD for:  severe uncontrolled pain   Complete by: As directed    Call MD for:  temperature >100.4   Complete by: As directed    Diet general   Complete by: As directed    Discharge instructions   Complete by: As  directed    General discharge instructions: Follow with Primary MD Jerre Simon, MD in 7 days  Please request your PCP  to go over your hospital tests, procedures, radiology results at the follow up. Please get your medicines reviewed and adjusted.  Your PCP may decide to repeat certain labs or tests as needed. Do not drive, operate heavy machinery, perform activities at heights, swimming or participation in water activities or provide baby sitting services if your were admitted for syncope or siezures until you have seen by Primary MD or a Neurologist and advised to do so again. [place] Controlled Substance Reporting System database was reviewed. Do not drive, operate heavy machinery, perform activities at heights, swim, participate in water activities or provide baby-sitting services while on medications for pain, sleep and mood until  your outpatient physician has reevaluated you and advised to do so again.  You are strongly recommended to comply with the dose, frequency and duration of prescribed medications. Activity: As tolerated with Full fall precautions use walker/cane & assistance as needed Avoid using any recreational substances like cigarette, tobacco, alcohol, or non-prescribed drug. If you experience worsening of your admission symptoms, develop shortness of breath, life threatening emergency, suicidal or homicidal thoughts you must seek medical attention immediately by calling 911 or calling your MD immediately  if symptoms less severe. You must read complete instructions/literature along with all the possible adverse reactions/side effects for all the medicines you take and that have been prescribed to you. Take any new medicine only after you have completely understood and accepted all the possible adverse reactions/side effects.  Wear Seat belts while driving. You were cared for by a hospitalist during your hospital stay. If you have any questions about your discharge medications  or the care you received while you were in the hospital after you are discharged, you can call the unit and ask to speak with the hospitalist or the covering physician. Once you are discharged, your primary care physician will handle any further medical issues. Please note that NO REFILLS for any discharge medications will be authorized once you are discharged, as it is imperative that you return to your primary care physician (or establish a relationship with a primary care physician if you do not have one).   Increase activity slowly   Complete by: As directed        Discharge Medications:   Allergies as of 07/14/2022       Reactions   Aspirin Other (See Comments)   Kidney disease   Methadone Hcl Other (See Comments)    "blacked out" in 1990's   Levofloxacin Itching        Medication List     TAKE these medications    Accu-Chek Aviva Plus test strip Generic drug: glucose blood 1 EACH BY OTHER ROUTE 3 (THREE) TIMES DAILY. TEST 3 TIMES DAILY   acetaminophen 500 MG tablet Commonly known as: TYLENOL Take 1,000 mg by mouth 2 (two) times daily.   albuterol 108 (90 Base) MCG/ACT inhaler Commonly known as: VENTOLIN HFA TAKE 2 PUFFS BY MOUTH EVERY 6 HOURS AS NEEDED FOR WHEEZE OR SHORTNESS OF BREATH Strength: 108 (90 Base) MCG/ACT What changed:  how much to take how to take this when to take this reasons to take this   ammonium lactate 12 % lotion Commonly known as: LAC-HYDRIN For dry skin What changed:  how much to take how to take this when to take this   atorvastatin 40 MG tablet Commonly known as: LIPITOR TAKE ONE TABLET BY MOUTH EVERY DAY   baclofen 10 MG tablet Commonly known as: LIORESAL Take 1 tablet (10 mg total) by mouth 3 (three) times daily for 3 days.   Bariatric Multivitamins/Iron Caps Take 1 capsule by mouth 2 (two) times daily.   Belbuca 900 MCG Film Generic drug: Buprenorphine HCl Place 1 Film inside cheek 2 (two) times daily for 3 days.    busPIRone 10 MG tablet Commonly known as: BUSPAR TAKE ONE TABLET BY MOUTH THREE TIMES DAILY   calcium carbonate 1500 (600 Ca) MG Tabs tablet Commonly known as: OSCAL Take 1,500 mg by mouth 2 (two) times daily.   diclofenac Sodium 1 % Gel Commonly known as: VOLTAREN APPLY FOUR GRAMS FOUR TIMES DAILY AS DIRECTED What changed: See the new instructions.  diltiazem 120 MG 24 hr capsule Commonly known as: CARDIZEM CD Take 1 capsule (120 mg total) by mouth 2 (two) times daily.   empagliflozin 10 MG Tabs tablet Commonly known as: JARDIANCE Take 1 tablet (10 mg total) by mouth daily.   fluticasone 50 MCG/ACT nasal spray Commonly known as: FLONASE INSTILL TWO SPRAYS INTO BOTH NOSTRILS TWICE DAILY What changed:  how much to take how to take this when to take this   furosemide 80 MG tablet Commonly known as: Lasix Take 1 tablet (80 mg total) by mouth 2 (two) times daily.   gabapentin 100 MG capsule Commonly known as: NEURONTIN Take 1 capsule (100 mg total) by mouth 2 (two) times daily. What changed:  medication strength how much to take how to take this when to take this additional instructions   ketoconazole 2 % cream Commonly known as: NIZORAL Apply 1 Application topically daily.   linaclotide 72 MCG capsule Commonly known as: LINZESS Take 72 mcg by mouth daily before breakfast.   loratadine 10 MG tablet Commonly known as: CLARITIN Take 1 tablet (10 mg total) by mouth daily.   losartan 25 MG tablet Commonly known as: COZAAR Take 1 tablet (25 mg total) by mouth at bedtime.   montelukast 10 MG tablet Commonly known as: SINGULAIR TAKE 1 TABLET BY MOUTH EVERYDAY AT BEDTIME Strength: 10 mg What changed:  how much to take how to take this when to take this additional instructions   Narcan 4 MG/0.1ML Liqd nasal spray kit Generic drug: naloxone Place 0.4 mg into the nose once as needed (accidental overdose).   nystatin 100000 UNIT/ML suspension Commonly known  as: MYCOSTATIN Take 5 mLs (500,000 Units total) by mouth 4 (four) times daily. Swish and swallow. What changed:  when to take this reasons to take this   omeprazole 20 MG capsule Commonly known as: PRILOSEC TAKE ONE CAPSULE BY MOUTH EVERY DAY   potassium chloride SA 20 MEQ tablet Commonly known as: KLOR-CON M Take 20 mEq by mouth daily.   Trelegy Ellipta 100-62.5-25 MCG/ACT Aepb Generic drug: Fluticasone-Umeclidin-Vilant Inhale 1 puff into the lungs daily.   venlafaxine XR 75 MG 24 hr capsule Commonly known as: EFFEXOR-XR Take 1 capsule (75 mg total) by mouth daily with breakfast.         The results of significant diagnostics from this hospitalization (including imaging, microbiology, ancillary and laboratory) are listed below for reference.    Procedures and Diagnostic Studies:   MR Lumbar Spine W Wo Contrast  Result Date: 07/10/2022 CLINICAL DATA:  Low back pain, cauda equina syndrome suspected. EXAM: MRI LUMBAR SPINE WITHOUT AND WITH CONTRAST TECHNIQUE: Multiplanar and multiecho pulse sequences of the lumbar spine were obtained without and with intravenous contrast. CONTRAST:  10mL GADAVIST GADOBUTROL 1 MMOL/ML IV SOLN COMPARISON:  Lumbar spine MRI 07/18/2020. CT lumbar spine 05/25/2021. FINDINGS: Segmentation: Conventional numbering is assumed with 5 non-rib-bearing, lumbar type vertebral bodies. Alignment:  Normal. Vertebrae: Interval postoperative changes of extension of interbody and posterior spinal fusion cranially to L2-3. Unchanged postoperative appearance from prior L3-S1 interbody and posterior spinal fusion with laminectomies extending from at least T12-L2 and L4-S1. Evolving osteonecrotic cleft at the L1-2 disc space with fluid, but no associated enhancement or endplate destruction. Conus medullaris and cauda equina: Conus extends to the L2 level. Conus and cauda equina appear normal. Paraspinal and other soft tissues: Unremarkable. Disc levels: Severe bilateral  neural foraminal narrowing at L1-2. No spinal canal stenosis in the lumbar spine. IMPRESSION: 1. Interval postoperative  changes of extension of interbody and posterior spinal fusion cranially to L2-3. 2. Evolving osteonecrotic cleft at the L1-2 disc space with fluid, but no associated enhancement or endplate destruction. 3. Severe bilateral neural foraminal narrowing at L1-2. 4. No spinal canal stenosis in the lumbar spine. Electronically Signed   By: Orvan Falconer M.D.   On: 07/10/2022 16:17   DG Lumbar Spine Complete  Result Date: 07/10/2022 CLINICAL DATA:  back pain EXAM: LUMBAR SPINE - COMPLETE 4 VIEW COMPARISON:  CT L Spine 09/24/21, CXR L SPine 01/31/22 FINDINGS: Status post L1-L5 posterior spinal fusion. Hardware is intact. There is solid osseous fusion at the L2-L5 vertebral bodies. There is no evidence of osseous fusion at the L1-L2 disc space. Sclerotic appearance of the L1 vertebral body is nonspecific. There also irregular endplate changes of the superior endplate of L1 and inferior endplate of T 13. Assessment for possibility of acute traumatic injury is limited due to the degree of osteopenia. IMPRESSION: 1. Status post L1-L5 posterior spinal fusion. Hardware is intact. 2. Sclerotic appearance of the L1 vertebral body with irregular endplate changes of the superior endplate of L1 and inferior endplate of T13. Recommend further evaluation with a CT or a contrast-enhanced lumbar spine MRI to exclude the possibility of infection. These findings are new compared to 01/31/2022. Electronically Signed   By: Lorenza Cambridge M.D.   On: 07/10/2022 14:21   DG Chest Port 1 View  Result Date: 07/10/2022 CLINICAL DATA:  Back pain EXAM: PORTABLE CHEST 1 VIEW COMPARISON:  06/09/2022 FINDINGS: Examination is limited by significant patient rotation. Cardiac silhouette is enlarged. No confluent airspace opacity. No pleural effusion or pneumothorax. Cervicothoracic fusion hardware is partially imaged. IMPRESSION:  Cardiomegaly. No confluent airspace opacity. Electronically Signed   By: Duanne Guess D.O.   On: 07/10/2022 14:19     Labs:   Basic Metabolic Panel: Recent Labs  Lab 07/10/22 1110 07/11/22 0431 07/12/22 0402 07/13/22 0536 07/14/22 0421  NA 136 137 138 140 143  K 3.9 3.9 3.4* 3.4* 3.5  CL 99 98 98 101 100  CO2 25 26 29 29 31   GLUCOSE 159* 125* 116* 94 112*  BUN 25* 27* 28* 26* 21*  CREATININE 1.30* 1.24* 1.19* 1.01* 0.99  CALCIUM 8.8* 8.9 9.0 8.8* 9.1   GFR Estimated Creatinine Clearance: 82 mL/min (by C-G formula based on SCr of 0.99 mg/dL). Liver Function Tests: Recent Labs  Lab 07/11/22 0431  AST 16  ALT 22  ALKPHOS 85  BILITOT 0.7  PROT 6.7  ALBUMIN 3.1*   No results for input(s): "LIPASE", "AMYLASE" in the last 168 hours. No results for input(s): "AMMONIA" in the last 168 hours. Coagulation profile No results for input(s): "INR", "PROTIME" in the last 168 hours.  CBC: Recent Labs  Lab 07/10/22 1110 07/11/22 0431 07/12/22 0402 07/13/22 0536  WBC 8.5 9.7 14.8* 12.2*  NEUTROABS 5.4  --   --   --   HGB 17.1* 16.4* 17.5* 17.1*  HCT 50.2* 49.6* 52.2* 51.5*  MCV 73.7* 75.0* 75.2* 75.1*  PLT 333 336 354 334   Cardiac Enzymes: No results for input(s): "CKTOTAL", "CKMB", "CKMBINDEX", "TROPONINI" in the last 168 hours. BNP: Invalid input(s): "POCBNP" CBG: Recent Labs  Lab 07/12/22 2055 07/13/22 0939 07/13/22 1240 07/13/22 2037 07/14/22 0738  GLUCAP 93 93 127* 132* 127*   D-Dimer No results for input(s): "DDIMER" in the last 72 hours. Hgb A1c No results for input(s): "HGBA1C" in the last 72 hours. Lipid Profile No results for  input(s): "CHOL", "HDL", "LDLCALC", "TRIG", "CHOLHDL", "LDLDIRECT" in the last 72 hours. Thyroid function studies No results for input(s): "TSH", "T4TOTAL", "T3FREE", "THYROIDAB" in the last 72 hours.  Invalid input(s): "FREET3" Anemia work up No results for input(s): "VITAMINB12", "FOLATE", "FERRITIN", "TIBC", "IRON",  "RETICCTPCT" in the last 72 hours. Microbiology Recent Results (from the past 240 hour(s))  MRSA Next Gen by PCR, Nasal     Status: Abnormal   Collection Time: 07/11/22  2:49 AM   Specimen: Nasal Mucosa; Nasal Swab  Result Value Ref Range Status   MRSA by PCR Next Gen DETECTED (A) NOT DETECTED Final    Comment: (NOTE) The GeneXpert MRSA Assay (FDA approved for NASAL specimens only), is one component of a comprehensive MRSA colonization surveillance program. It is not intended to diagnose MRSA infection nor to guide or monitor treatment for MRSA infections. Test performance is not FDA approved in patients less than 18 years old. Performed at [facility], [address] [place], [place] [address]     Time coordinating discharge: 45 minutes  Signed: Melina Schools Harshita Bernales  [facility] 07/14/2022, 11:27 AM

## 2022-07-15 DIAGNOSIS — L89322 Pressure ulcer of left buttock, stage 2: Secondary | ICD-10-CM | POA: Diagnosis not present

## 2022-07-15 DIAGNOSIS — J302 Other seasonal allergic rhinitis: Secondary | ICD-10-CM | POA: Diagnosis not present

## 2022-07-15 DIAGNOSIS — E1122 Type 2 diabetes mellitus with diabetic chronic kidney disease: Secondary | ICD-10-CM | POA: Diagnosis not present

## 2022-07-15 DIAGNOSIS — E785 Hyperlipidemia, unspecified: Secondary | ICD-10-CM | POA: Diagnosis not present

## 2022-07-15 DIAGNOSIS — R531 Weakness: Secondary | ICD-10-CM | POA: Diagnosis not present

## 2022-07-15 DIAGNOSIS — I5033 Acute on chronic diastolic (congestive) heart failure: Secondary | ICD-10-CM | POA: Diagnosis not present

## 2022-07-15 DIAGNOSIS — K5900 Constipation, unspecified: Secondary | ICD-10-CM | POA: Diagnosis not present

## 2022-07-15 DIAGNOSIS — E1140 Type 2 diabetes mellitus with diabetic neuropathy, unspecified: Secondary | ICD-10-CM | POA: Diagnosis not present

## 2022-07-15 DIAGNOSIS — I10 Essential (primary) hypertension: Secondary | ICD-10-CM | POA: Diagnosis not present

## 2022-07-15 DIAGNOSIS — G8929 Other chronic pain: Secondary | ICD-10-CM | POA: Diagnosis not present

## 2022-07-15 DIAGNOSIS — R058 Other specified cough: Secondary | ICD-10-CM | POA: Diagnosis not present

## 2022-07-15 DIAGNOSIS — F32A Depression, unspecified: Secondary | ICD-10-CM | POA: Diagnosis not present

## 2022-07-17 DIAGNOSIS — R262 Difficulty in walking, not elsewhere classified: Secondary | ICD-10-CM | POA: Diagnosis not present

## 2022-07-17 DIAGNOSIS — M25511 Pain in right shoulder: Secondary | ICD-10-CM | POA: Diagnosis not present

## 2022-07-17 DIAGNOSIS — M25512 Pain in left shoulder: Secondary | ICD-10-CM | POA: Diagnosis not present

## 2022-07-17 DIAGNOSIS — R5381 Other malaise: Secondary | ICD-10-CM | POA: Diagnosis not present

## 2022-07-18 DIAGNOSIS — M25512 Pain in left shoulder: Secondary | ICD-10-CM | POA: Diagnosis not present

## 2022-07-18 DIAGNOSIS — R051 Acute cough: Secondary | ICD-10-CM | POA: Diagnosis not present

## 2022-07-18 DIAGNOSIS — R058 Other specified cough: Secondary | ICD-10-CM | POA: Diagnosis not present

## 2022-07-18 DIAGNOSIS — M545 Low back pain, unspecified: Secondary | ICD-10-CM | POA: Diagnosis not present

## 2022-07-19 DIAGNOSIS — Z9181 History of falling: Secondary | ICD-10-CM | POA: Diagnosis not present

## 2022-07-19 DIAGNOSIS — R52 Pain, unspecified: Secondary | ICD-10-CM | POA: Diagnosis not present

## 2022-07-19 DIAGNOSIS — L89322 Pressure ulcer of left buttock, stage 2: Secondary | ICD-10-CM | POA: Diagnosis not present

## 2022-07-19 DIAGNOSIS — E1122 Type 2 diabetes mellitus with diabetic chronic kidney disease: Secondary | ICD-10-CM | POA: Diagnosis not present

## 2022-07-19 DIAGNOSIS — R531 Weakness: Secondary | ICD-10-CM | POA: Diagnosis not present

## 2022-07-19 DIAGNOSIS — K219 Gastro-esophageal reflux disease without esophagitis: Secondary | ICD-10-CM | POA: Diagnosis not present

## 2022-07-19 DIAGNOSIS — I5033 Acute on chronic diastolic (congestive) heart failure: Secondary | ICD-10-CM | POA: Diagnosis not present

## 2022-07-19 DIAGNOSIS — F32A Depression, unspecified: Secondary | ICD-10-CM | POA: Diagnosis not present

## 2022-07-19 DIAGNOSIS — J302 Other seasonal allergic rhinitis: Secondary | ICD-10-CM | POA: Diagnosis not present

## 2022-07-19 DIAGNOSIS — E785 Hyperlipidemia, unspecified: Secondary | ICD-10-CM | POA: Diagnosis not present

## 2022-07-19 DIAGNOSIS — I10 Essential (primary) hypertension: Secondary | ICD-10-CM | POA: Diagnosis not present

## 2022-07-19 DIAGNOSIS — K5900 Constipation, unspecified: Secondary | ICD-10-CM | POA: Diagnosis not present

## 2022-07-20 DIAGNOSIS — M25512 Pain in left shoulder: Secondary | ICD-10-CM | POA: Diagnosis not present

## 2022-07-20 DIAGNOSIS — G8929 Other chronic pain: Secondary | ICD-10-CM | POA: Diagnosis not present

## 2022-07-21 DIAGNOSIS — M25512 Pain in left shoulder: Secondary | ICD-10-CM | POA: Diagnosis not present

## 2022-07-21 DIAGNOSIS — M545 Low back pain, unspecified: Secondary | ICD-10-CM | POA: Diagnosis not present

## 2022-07-24 ENCOUNTER — Ambulatory Visit (INDEPENDENT_AMBULATORY_CARE_PROVIDER_SITE_OTHER): Payer: 59 | Admitting: Orthopaedic Surgery

## 2022-07-24 ENCOUNTER — Other Ambulatory Visit (INDEPENDENT_AMBULATORY_CARE_PROVIDER_SITE_OTHER): Payer: 59

## 2022-07-24 ENCOUNTER — Telehealth: Payer: Self-pay

## 2022-07-24 ENCOUNTER — Encounter: Payer: Self-pay | Admitting: Orthopaedic Surgery

## 2022-07-24 DIAGNOSIS — G8929 Other chronic pain: Secondary | ICD-10-CM | POA: Diagnosis not present

## 2022-07-24 DIAGNOSIS — M25511 Pain in right shoulder: Secondary | ICD-10-CM | POA: Diagnosis not present

## 2022-07-24 DIAGNOSIS — M25512 Pain in left shoulder: Secondary | ICD-10-CM

## 2022-07-24 NOTE — Telephone Encounter (Signed)
Called patient to inquire as to whether or not she has switched pharmacy to [facility] as we received a request.  There was no answer. If patient calls back. Please verify her pharmacy.  Glennie Hawk, CMA

## 2022-07-24 NOTE — Telephone Encounter (Signed)
Patient returns call to nurse line.   Patient reports [facility] is using [facility]   She denies using or wanting to use [facility].

## 2022-07-24 NOTE — Progress Notes (Signed)
Office Visit Note   Patient: [name]           Date of Birth: January 12, 1966           MRN: [id number] Visit Date: 07/24/2022              Requested by: Jerre Simon, MD [address] [place],  [place] [address] PCP: Jerre Simon, MD   Assessment & Plan: Visit Diagnoses:  1. Chronic pain of both shoulders     Plan: Patient is a 57 year old female with chronic bilateral shoulder pain.  She has had a rotator cuff repair of the left shoulder and she had an MRI last year that showed a large supraspinatus tear.  At this point we will need new MRIs of both shoulders to fully assess for structural abnormalities.  Follow-up after the MRI.  Follow-Up Instructions: No follow-ups on file.   Orders:  Orders Placed This Encounter  Procedures   XR Shoulder Left   XR Shoulder Right   MR SHOULDER RIGHT WO CONTRAST   MR Shoulder Left w/o contrast   No orders of the defined types were placed in this encounter.     Procedures: No procedures performed   Clinical Data: No additional findings.   Subjective: Chief Complaint  Patient presents with   Left Shoulder - Pain   Right Shoulder - Pain    HPI Crucita comes in today for continued bilateral shoulder pain.  The patient has been minimally ambulatory since she was recently hospitalized.  She had an MRI of the right shoulder last year that showed a large supraspinatus tear but she was medically too complex to do surgery at that time.  She is also status post left rotator cuff repair couple years ago and she has had trouble with pain in that shoulder as well.  Denies any injuries to the shoulder.  Review of Systems  Constitutional: Negative.   HENT: Negative.    Eyes: Negative.   Respiratory: Negative.    Cardiovascular: Negative.   Endocrine: Negative.   Musculoskeletal: Negative.   Neurological: Negative.   Hematological: Negative.   Psychiatric/Behavioral: Negative.    All other systems reviewed and are  negative.    Objective: Vital Signs: LMP 01/29/1992   Physical Exam Vitals and nursing note reviewed.  Constitutional:      Appearance: She is well-developed.  HENT:     Head: Atraumatic.     Nose: Nose normal.  Eyes:     Extraocular Movements: Extraocular movements intact.  Cardiovascular:     Pulses: Normal pulses.  Pulmonary:     Effort: Pulmonary effort is normal.  Abdominal:     Palpations: Abdomen is soft.  Musculoskeletal:     Cervical back: Neck supple.  Skin:    General: Skin is warm.     Capillary Refill: Capillary refill takes less than 2 seconds.  Neurological:     Mental Status: She is alert. Mental status is at baseline.  Psychiatric:        Behavior: Behavior normal.        Thought Content: Thought content normal.        Judgment: Judgment normal.     Ortho Exam Examination bilateral shoulders is limited by significant pain and guarding to any range of motion or manual muscle testing. Specialty Comments:  No specialty comments available.  Imaging: No results found.   PMFS History: Patient Active Problem List   Diagnosis Date Noted   CHF (congestive heart failure) (  HCC) 07/11/2022   Acute on chronic diastolic congestive heart failure (HCC) 07/10/2022   Acute on chronic heart failure with preserved ejection fraction (HFpEF) (HCC) 06/09/2022   Acute exacerbation of CHF (congestive heart failure) (HCC) 06/09/2022   Panniculitis 12/04/2021   Infection of lip 08/27/2021   Lumbar stenosis with neurogenic claudication 06/29/2021   Lumbar radiculopathy 06/29/2021   Biceps tendinopathy of right upper extremity 05/03/2021   Irregular heartbeat 04/11/2021   Neuropathic pain of foot 03/28/2021   Chronic congestion of paranasal sinus 03/28/2021   Impingement syndrome of right shoulder 03/08/2021   Arthritis of right acromioclavicular joint 03/08/2021   Acute medial meniscus tear of right knee 03/08/2021   Acute lateral meniscus tear of right knee  03/08/2021   Spinal stenosis of thoracic region with radiculopathy    Bilateral leg numbness    Spinal stenosis of lumbar region with neurogenic claudication    Primary osteoarthritis of right knee    Impaired ambulation 01/10/2021   Status post lumbar laminectomy 10/03/2020   Cubital tunnel syndrome on left 05/02/2020   Vaginal discharge 01/20/2020   Prolapse of anterior vaginal wall 11/10/2019   Skin ulcer of left thigh, limited to breakdown of skin (HCC) 09/09/2019   Ulnar nerve compression 08/06/2019   Diabetic neuropathy associated with type 2 diabetes mellitus (HCC) 08/06/2019   Body mass index 50.0-59.9, adult (HCC) 06/12/2019   Impingement syndrome of left shoulder 05/07/2019   Polyp of colon    Polyp of ascending colon    Rectal discomfort 02/12/2019   Osteoarthritis of right hip 02/12/2019   Nontraumatic tear of left supraspinatus tendon 12/30/2018   Gout 12/17/2018   Nontraumatic complete tear of right rotator cuff 12/08/2018   Rotator cuff tear, right 12/01/2018   Tobacco abuse 08/21/2018   Right groin pain 07/20/2018   Depressed mood 07/13/2018   Paroxysmal SVT (supraventricular tachycardia) 03/20/2018   Pure hypercholesterolemia 03/20/2018   Muscle cramping 02/25/2018   Nodule of upper lobe of right lung 02/13/2018   Falls, subsequent encounter 01/30/2018   Cervical post-laminectomy syndrome 11/13/2017   Pain in left foot 11/04/2017   Essential hypertension 10/27/2017   Solitary pulmonary nodule 10/07/2017   Restrictive lung disease secondary to obesity 10/01/2017   Polycythemia, secondary 10/01/2017   Chronic viral hepatitis B without delta-agent (HCC) 07/08/2017   COPD (chronic obstructive pulmonary disease) with chronic bronchitis 06/27/2017   Chronic kidney disease (CKD), stage IV (severe) (HCC) 01/14/2017   Decubitus ulcer 12/13/2016   Type 2 diabetes mellitus with stage 3 chronic kidney disease, with long-term current use of insulin (HCC) 12/12/2016    Urge incontinence of urine 12/05/2016   Trigger finger, left index finger 07/15/2016   Back pain 04/07/2014   Morbid obesity (HCC) 10/02/2012   Chronic diastolic CHF (congestive heart failure) (HCC) 04/07/2012   Skin lesion of scalp 03/15/2011   GERD 01/26/2010   Obstructive sleep apnea treated with BiPAP 02/03/2008   FIBROCYSTIC BREAST DISEASE 10/28/2006   Hyperlipidemia 03/27/2006   Obesity hypoventilation syndrome (HCC) 03/27/2006   Depression with anxiety 03/27/2006   Past Medical History:  Diagnosis Date   Alcoholism in remission (HCC)    11-26-2021  pt stated no alcohol few yrs ago   Asthma    Bipolar disorder (HCC)    Chronic diastolic CHF (congestive heart failure) (HCC) 2014   followed by dr b. Cristal Deer;   Chronic hypoxemic respiratory failure [facility])    pulmonologist--- dr Tonia Brooms;   (11-26-2021 per pt was on supplemental oxygen until she  01/ 2022 did not need it,  does not check O2 sats at home anymore.   CKD (chronic kidney disease), stage III [facility])    nephrologist--- dr Ronalee Belts   COPD (chronic obstructive pulmonary disease) (HCC)    11-26-2021 pt trying to quit smoking ,  started age 6   Depression    Diabetes mellitus type 2, diet-controlled (HCC)    followed by pcp   (11-26-2021  per pt last A1c by pcp on 10-30-2021 7.8,  stated does not check blood sugar)   Dyspnea    Fibrocystic breast disease    GAD (generalized anxiety disorder)    GERD (gastroesophageal reflux disease)    Hepatitis B surface antigen positive 11/2017   pt stated asymptomatic ,  no treatment   History of cervical dysplasia 04/10/2011   '93- once dx.-got pregnant-no intervention, then postpartum, no dysplasia found   History of condyloma acuminatum 04/09/2011   Hyperlipidemia    Hypertension    Incomplete left bundle branch block (LBBB)    Lung nodule 11/2017   pulmonologist--- dr Tonia Brooms;  RUL and RLL   Muscle spasm of both lower legs    chronic , due to back issues,   OA  (osteoarthritis) 04/10/2011   hips, shoulders, back   OSA (obstructive sleep apnea) 2009   study on epic 09-18-2007 mild osa w/ hypoxmia ;   (11-26-2021 per pt last used cpap 01/ 2022 before bariatric surgery 04/ 2022 stated lost wt / and stopped needing supplemental oxygen , did not need anymore)   Polyneuropathy    due to back issues and DM   Polysubstance abuse (HCC)    11-26-2021  pt hx cocaine use stated none for yrs but ED visit 08-14-2021  positive UDS,  pt stated it was once only for depression none since   PSVT (paroxysmal supraventricular tachycardia)    followed by cardiology   Spinal stenosis, unspecified spinal region    Urge incontinence of urine    Wears dentures    upper    Family History  Problem Relation Age of Onset   Pulmonary embolism Mother    Stroke Father    Alcohol abuse Father    Pneumonia Father    Hypertension Father    Breast cancer Maternal Aunt    Hypertension Other    Diabetes Other        Aunts and cousins   Asthma Sister    Depression Sister    Arthritis Sister    Sickle cell anemia Son     Past Surgical History:  Procedure Laterality Date   ANAL FISTULECTOMY  04/17/2011   Procedure: FISTULECTOMY ANAL;  Surgeon: Wilmon Arms. Corliss Skains, MD;  Location: [facility];  Service: General;  Laterality: N/A;  Excision of Condyloma Gluteal Cleft    ANAL RECTAL MANOMETRY N/A 03/26/2019   Procedure: ANO RECTAL MANOMETRY;  Surgeon: Tressia Danas, MD;  Location: [facility];  Service: Gastroenterology;  Laterality: N/A;   BIOPSY  03/09/2019   Procedure: BIOPSY;  Surgeon: Tressia Danas, MD;  Location: [facility];  Service: Gastroenterology;;   BOTOX INJECTION N/A 12/29/2018   Procedure: BOTOX INJECTION(100 UNITS)  WITH CYSTOSCOPY;  Surgeon: Rene Paci, MD;  Location: [facility];  Service: Urology;  Laterality: N/A;   BOTOX INJECTION N/A 07/27/2019   Procedure: BOTOX INJECTION WITH CYSTOSCOPY;  Surgeon: Rene Paci, MD;  Location:  [facility];  Service: Urology;  Laterality: N/A;  ONLY NEEDS 30 MIN   BOTOX INJECTION N/A 05/02/2021   Procedure:  BOTOX INJECTION WITH CYSTOSCOPY;  Surgeon: Rene Paci, MD;  Location: [facility];  Service: Urology;  Laterality: N/A;  ONLY NEEDS 30 MIN   BOTOX INJECTION N/A 11/28/2021   Procedure: BOTOX INJECTION WITH CYSTOSCOPY, 200 UNITS;  Surgeon: Rene Paci, MD;  Location: [facility];  Service: Urology;  Laterality: N/A;  ONLY NEEDS 30 MIN   BOTOX INJECTION N/A 05/15/2022   Procedure: BOTOX INJECTION 200 UNITS;  Surgeon: Rene Paci, MD;  Location: [facility];  Service: Urology;  Laterality: N/A;   BREAST EXCISIONAL BIOPSY Right    x 3   BREAST EXCISIONAL BIOPSY Left    12-29-2000  and 11-30-2001 @[facility]  by dr Carolynne Edouard   CARPAL TUNNEL RELEASE Bilateral    right 08-30-2002 and left 07-04-2003  by dr Otelia Sergeant @ [facility]   CATARACT EXTRACTION W/ INTRAOCULAR LENS IMPLANT Bilateral 12/2020   CERVICAL FUSION  04/10/2011   x3- cervical fusion with plating and screws-Dr. Noel Gerold   COLONOSCOPY WITH PROPOFOL N/A 07/06/2015   Procedure: COLONOSCOPY WITH PROPOFOL;  Surgeon: Dorena Cookey, MD;  Location: [facility];  Service: Endoscopy;  Laterality: N/A;   COLONOSCOPY WITH PROPOFOL N/A 03/09/2019   Procedure: COLONOSCOPY WITH PROPOFOL;  Surgeon: Tressia Danas, MD;  Location: [facility];  Service: Gastroenterology;  Laterality: N/A;   CYSTOSCOPY N/A 05/15/2022   Procedure: CYSTOSCOPY;  Surgeon: Rene Paci, MD;  Location: [facility];  Service: Urology;  Laterality: N/A;   FRACTURE SURGERY     little toe left foot   I & D EXTREMITY Left 12/18/2016   Procedure: IRRIGATION AND DEBRIDEMENT LEFT FOOT, CLOSURE;  Surgeon: Tarry Kos, MD;  Location: [facility];  Service: Orthopedics;  Laterality: Left;   INCISION AND DRAINAGE ABSCESS Right 05/03/2002   @[facility]  by dr Carolynne Edouard;   right breast abscess   INJECTION KNEE  Right 10/03/2020   Procedure: KNEE INJECTION;  Surgeon: Kerrin Champagne, MD;  Location: [facility];  Service: Orthopedics;  Laterality: Right;   KNEE ARTHROSCOPY WITH MEDIAL MENISECTOMY Right 03/21/2021   Procedure: RIGHT KNEE ARTHROSCOPY WITH MEDIAL AND PARTIAL LATERAL MENISCECTOMY;  Surgeon: Tarry Kos, MD;  Location: [facility];  Service: Orthopedics;  Laterality: Right;   LAPAROSCOPIC GASTRIC SLEEVE RESECTION N/A 05/08/2020   Procedure: LAPAROSCOPIC GASTRIC SLEEVE RESECTION;  Surgeon: Sheliah Hatch De Blanch, MD;  Location: [facility];  Service: General;  Laterality: N/A;   LUMBAR DISC SURGERY  04/10/2011   x5-Lumbar fusion-retained hardware.(Dr. Otelia Sergeant)   LUMBAR LAMINECTOMY N/A 10/03/2020   Procedure: THORACOLUMBAR LAMINECTOMIES THORACIC ELEVEN-TWELVE, THORACIC TWELVE-LUMBAR ONE  AND LUMBAT ONE-TWO;  Surgeon: Kerrin Champagne, MD;  Location: [facility];  Service: Orthopedics;  Laterality: N/A;   LUMBAR PERCUTANEOUS PEDICLE SCREW 2 LEVEL  06/29/2021   Procedure: LUMBAR PERCUTANEOUS PEDICLE SCREW LUMBAR ONE-TWO;  Surgeon: Julio Sicks, MD;  Location: [facility];  Service: Neurosurgery;;   MULTIPLE TOOTH EXTRACTIONS     POLYPECTOMY  03/09/2019   Procedure: POLYPECTOMY;  Surgeon: Tressia Danas, MD;  Location: [facility];  Service: Gastroenterology;;   POSTERIOR LUMBAR FUSION  04/04/2005   @[facility]  by dr Otelia Sergeant;   L3-- L5   POSTERIOR LUMBAR FUSION  03/04/2006   @[facility]  dr Otelia Sergeant;   fusion L2--3 and redo L3--5   SHOULDER ARTHROSCOPY WITH ROTATOR CUFF REPAIR AND SUBACROMIAL DECOMPRESSION Left 05/07/2019   Procedure: LEFT SHOULDER ARTHROSCOPY WITH DEBRIDEMENT, DISTAL CLAVICLE EXCISION,  SUBACROMIAL DECOMPRESSION AND POSSIBLE ROTATOR CUFF REPAIR;  Surgeon: Tarry Kos, MD;  Location[facility];  Service: Orthopedics;  Laterality: Left;   TEE WITHOUT CARDIOVERSION N/A 04/06/2012   Procedure: TRANSESOPHAGEAL ECHOCARDIOGRAM (TEE);  Surgeon: Chrystie Nose, MD;  Location: [facility];  Service: Cardiovascular;   Laterality: N/A;   TRIGGER FINGER RELEASE Right    middle finger   TRIGGER FINGER RELEASE Left 06/28/2016   Procedure: RELEASE TRIGGER FINGER LEFT 3RD FINGER;  Surgeon: Tarry Kos, MD;  Location: [facility];  Service: Orthopedics;  Laterality: Left;   TRIGGER FINGER RELEASE Right 06/12/2016   Procedure: RIGHT INDEX FINGER TRIGGER RELEASE;  Surgeon: Tarry Kos, MD;  Location: [facility];  Service: Orthopedics;  Laterality: Right;   TRIGGER FINGER RELEASE Right 01/19/2018   Procedure: RIGHT RING FINGER TRIGGER FINGER RELEASE;  Surgeon: Tarry Kos, MD;  Location: [facility];  Service: Orthopedics;  Laterality: Right;   TRIGGER FINGER RELEASE Left 05/07/2019   Procedure: RELEASE TRIGGER FINGER LEFT INDEX FINGER;  Surgeon: Tarry Kos, MD;  Location: [facility];  Service: Orthopedics;  Laterality: Left;   UPPER GI ENDOSCOPY N/A 05/08/2020   Procedure: UPPER GI ENDOSCOPY;  Surgeon: Sheliah Hatch, De Blanch, MD;  Location: [facility];  Service: General;  Laterality: N/A;   Social History   Occupational History   Occupation: disabled    Comment: Disabled  Tobacco Use   Smoking status: Every Day    Packs/day: 0.50    Years: 44.00    Additional pack years: 0.00    Total pack years: 22.00    Types: Cigarettes    Start date: 01/28/1977   Smokeless tobacco: Never  Vaping Use   Vaping Use: Never used  Substance and Sexual Activity   Alcohol use: Not Currently    Comment: hx alcoholism,  (11-26-2021 per pt no alcohol in a while)   Drug use: Not Currently    Comment: 11-26-2021  (hx cocaine use) positive UDS in epic 08-14-2021  pt stated only did it once due to depression 07/ 2023 none since;  per pt last marijuana yrs ago   Sexual activity: Not Currently    Partners: Male    Birth control/protection: Post-menopausal

## 2022-07-25 ENCOUNTER — Inpatient Hospital Stay: Payer: 59 | Admitting: Student

## 2022-07-25 DIAGNOSIS — M48062 Spinal stenosis, lumbar region with neurogenic claudication: Secondary | ICD-10-CM | POA: Diagnosis not present

## 2022-07-25 DIAGNOSIS — M961 Postlaminectomy syndrome, not elsewhere classified: Secondary | ICD-10-CM | POA: Diagnosis not present

## 2022-07-28 DIAGNOSIS — M549 Dorsalgia, unspecified: Secondary | ICD-10-CM | POA: Diagnosis not present

## 2022-07-28 DIAGNOSIS — R531 Weakness: Secondary | ICD-10-CM | POA: Diagnosis not present

## 2022-07-28 DIAGNOSIS — G8929 Other chronic pain: Secondary | ICD-10-CM | POA: Diagnosis not present

## 2022-07-28 DIAGNOSIS — R5383 Other fatigue: Secondary | ICD-10-CM | POA: Diagnosis not present

## 2022-07-29 ENCOUNTER — Encounter: Payer: Self-pay | Admitting: Podiatry

## 2022-07-29 ENCOUNTER — Ambulatory Visit (INDEPENDENT_AMBULATORY_CARE_PROVIDER_SITE_OTHER): Payer: 59 | Admitting: Podiatry

## 2022-07-29 DIAGNOSIS — M79674 Pain in right toe(s): Secondary | ICD-10-CM

## 2022-07-29 DIAGNOSIS — M79675 Pain in left toe(s): Secondary | ICD-10-CM

## 2022-07-29 DIAGNOSIS — D649 Anemia, unspecified: Secondary | ICD-10-CM | POA: Diagnosis not present

## 2022-07-29 DIAGNOSIS — E1142 Type 2 diabetes mellitus with diabetic polyneuropathy: Secondary | ICD-10-CM | POA: Diagnosis not present

## 2022-07-29 DIAGNOSIS — B351 Tinea unguium: Secondary | ICD-10-CM | POA: Diagnosis not present

## 2022-07-29 NOTE — Progress Notes (Signed)
This patient returns to my office for at risk foot care.  This patient requires this care by a professional since this patient will be at risk due to having chronic kidney disease and type 2 diabetes.  This patient is unable to cut nails herself since the patient cannot reach her nails.These nails are painful walking and wearing shoes.  This patient presents for at risk foot care today.  General Appearance  Alert, conversant and in no acute stress.  Vascular  Dorsalis pedis and posterior tibial  pulses are palpable  bilaterally.  Capillary return is within normal limits  bilaterally. Temperature is within normal limits  bilaterally.  Neurologic  Senn-Weinstein monofilament wire test diminished  bilaterally. Muscle power within normal limits bilaterally.  Nails Thick disfigured discolored nails with subungual debris  from hallux to fifth toes bilaterally. No evidence of bacterial infection or drainage bilaterally.  Orthopedic  No limitations of motion  feet .  No crepitus or effusions noted.  No bony pathology or digital deformities noted.  HAV  Left foot.  Skin  normotropic skin with no porokeratosis noted bilaterally.  No signs of infections or ulcers noted.     Onychomycosis  Pain in right toes  Pain in left toes  Consent was obtained for treatment procedures.   Mechanical debridement of nails 1-5  bilaterally performed with a nail nipper.  Filed with dremel without incident.    Return office visit   4 months                 Told patient to return for periodic foot care and evaluation due to potential at risk complications.   Helane Gunther DPM

## 2022-07-30 DIAGNOSIS — R531 Weakness: Secondary | ICD-10-CM | POA: Diagnosis not present

## 2022-07-30 DIAGNOSIS — N182 Chronic kidney disease, stage 2 (mild): Secondary | ICD-10-CM | POA: Diagnosis not present

## 2022-07-30 DIAGNOSIS — G8929 Other chronic pain: Secondary | ICD-10-CM | POA: Diagnosis not present

## 2022-07-31 ENCOUNTER — Ambulatory Visit (INDEPENDENT_AMBULATORY_CARE_PROVIDER_SITE_OTHER): Payer: 59 | Admitting: Family

## 2022-07-31 ENCOUNTER — Encounter (HOSPITAL_BASED_OUTPATIENT_CLINIC_OR_DEPARTMENT_OTHER): Payer: Self-pay | Admitting: Family

## 2022-07-31 VITALS — BP 127/80 | HR 86 | Ht 69.0 in | Wt 250.0 lb

## 2022-07-31 DIAGNOSIS — I10 Essential (primary) hypertension: Secondary | ICD-10-CM

## 2022-07-31 DIAGNOSIS — M25512 Pain in left shoulder: Secondary | ICD-10-CM | POA: Diagnosis not present

## 2022-07-31 DIAGNOSIS — R5383 Other fatigue: Secondary | ICD-10-CM | POA: Diagnosis not present

## 2022-07-31 DIAGNOSIS — I471 Supraventricular tachycardia, unspecified: Secondary | ICD-10-CM | POA: Diagnosis not present

## 2022-07-31 DIAGNOSIS — I5032 Chronic diastolic (congestive) heart failure: Secondary | ICD-10-CM | POA: Diagnosis not present

## 2022-07-31 DIAGNOSIS — J4489 Other specified chronic obstructive pulmonary disease: Secondary | ICD-10-CM

## 2022-07-31 DIAGNOSIS — G4733 Obstructive sleep apnea (adult) (pediatric): Secondary | ICD-10-CM

## 2022-07-31 DIAGNOSIS — E782 Mixed hyperlipidemia: Secondary | ICD-10-CM

## 2022-07-31 DIAGNOSIS — G8929 Other chronic pain: Secondary | ICD-10-CM | POA: Diagnosis not present

## 2022-07-31 DIAGNOSIS — M545 Low back pain, unspecified: Secondary | ICD-10-CM | POA: Diagnosis not present

## 2022-07-31 MED ORDER — TRELEGY ELLIPTA 100-62.5-25 MCG/ACT IN AEPB
1.0000 | INHALATION_SPRAY | Freq: Every day | RESPIRATORY_TRACT | 1 refills | Status: DC
Start: 2022-07-31 — End: 2022-10-17

## 2022-07-31 MED ORDER — SPIRONOLACTONE 25 MG PO TABS
12.5000 mg | ORAL_TABLET | Freq: Every day | ORAL | 2 refills | Status: DC
Start: 2022-07-31 — End: 2022-08-20

## 2022-07-31 MED ORDER — SPIRONOLACTONE 25 MG PO TABS
25.0000 mg | ORAL_TABLET | Freq: Every day | ORAL | 2 refills | Status: DC
Start: 2022-07-31 — End: 2022-07-31

## 2022-07-31 MED ORDER — TRELEGY ELLIPTA 100-62.5-25 MCG/ACT IN AEPB
1.0000 | INHALATION_SPRAY | Freq: Every day | RESPIRATORY_TRACT | 1 refills | Status: DC
Start: 2022-07-31 — End: 2022-07-31

## 2022-07-31 MED ORDER — DILTIAZEM HCL ER COATED BEADS 120 MG PO CP24
120.0000 mg | ORAL_CAPSULE | Freq: Two times a day (BID) | ORAL | 5 refills | Status: DC
Start: 2022-07-31 — End: 2022-09-11

## 2022-07-31 MED ORDER — DILTIAZEM HCL ER COATED BEADS 120 MG PO CP24
120.0000 mg | ORAL_CAPSULE | Freq: Two times a day (BID) | ORAL | 5 refills | Status: DC
Start: 2022-07-31 — End: 2022-07-31

## 2022-07-31 NOTE — Progress Notes (Signed)
Cardiology Office Note:  .   Date:  07/31/2022  ID:  [name], DOB 01/14/1966, MRN [id number] PCP: Jerre Simon, MD  [facility] Providers Cardiologist:  Jodelle Red, MD    History of Present Illness: .   [name] is a 57 y.o. female alcohol use in remission, tobacco use, polysubstance abuse, obesity, OSA, paroxysmal SVT, COPD on home O2, DM 2, HTN, HLD, chronic diastolic heart failure, bipolar disorder, anxiety, depression, polyneuropathy, chronic back pain s/p multiple back surgeries.  Hospitalized 5/12 - 06/14/2022 for fall, acute diastolic heart failure discharged to rehab and then home after 1 week.  Echo 06/12/2022 LVEF-65%, no RWMA.  Previous echo noted to show diastolic dysfunction.  Admitted 6/12-6/16/24 with elevated BNP greater than 1300, CXR with cardiomegaly.  Diuresed with IV Lasix.  Discharged on Lasix 80 mg twice daily.Discharge weight 243.1 lbs.   Presents today for follow up.  Weight up 7 pounds from discharge. Reports since hsopital discahrge she has had good and bad days. Most bothered by stress and feeling upset which makes her feel tense associated with palpitations.  On review of EMR from SNF she is taking diltiazem only daily instead of twice daily as previously recommended. Yesterday she had to do an inhaler with more dyspnea at rest which has continued today.  Trelegy is no longer on her SNF medication list and unclear why, will resume.  Noted some swelling Sunday but improved by Monday, she does she feel she is holding onto some fluid.  ROS: Please see the history of present illness.    All other systems reviewed and are negative.   Studies Reviewed: .        Cardiac Studies & Procedures     STRESS TESTS  MYOCARDIAL PERFUSION IMAGING 04/13/2019  Narrative  The left ventricular ejection fraction is mildly decreased (45-54%).  Nuclear stress EF: 47%.  There was no ST segment deviation noted during stress.  This is an  intermediate risk study due to reduced systolic function. There is no ischemia.  New wall motion abnormalities noted. Consider echo if clinically warranted.   ECHOCARDIOGRAM  ECHOCARDIOGRAM COMPLETE 06/10/2022  Narrative ECHOCARDIOGRAM REPORT    Patient Name:   [name] Date of Exam: 06/10/2022 Medical Rec #:  [id number]       Height:       69.0 in Accession #:    [id number]      Weight:       282.8 lb Date of Birth:  01/27/1966       BSA:          2.394 m Patient Age:    56 years        BP:           150/92 mmHg Patient Gender: F               HR:           81  bpm. Exam Location:  Inpatient  Procedure: 2D Echo, Cardiac Doppler, Color Doppler and Intracardiac Opacification Agent  Indications:    Discharge  History:        Patient has prior history of Echocardiogram examinations, most recent 10/01/2017. Angina, Signs/Symptoms:Shortness of Breath; Risk Factors:Hypertension.  Sonographer:    Darlys Gales Referring Phys: [id number] MARSHALL L CHAMBLISS   Sonographer Comments: Technically difficult study due to poor echo windows. IMPRESSIONS   1. Extremely limited; LV function appears to be normal on definity images; color doppler suboptimal. 2. Left ventricular ejection  fraction, by estimation, is 60 to 65%. The left ventricle has normal function. The left ventricle has no regional wall motion abnormalities. Left ventricular diastolic parameters are indeterminate. 3. Right ventricular systolic function was not well visualized. The right ventricular size is not well visualized. 4. The mitral valve was not well visualized. not well visualized mitral valve regurgitation. 5. Tricuspid valve regurgitation not well visualized. 6. The aortic valve was not well visualized. Aortic valve regurgitation not well visualized. 7. Pulmonic valve regurgitation not well visualized.  FINDINGS Left Ventricle: Left ventricular ejection fraction, by estimation, is 60 to 65%. The left ventricle has  normal function. The left ventricle has no regional wall motion abnormalities. Definity contrast agent was given IV to delineate the left ventricular endocardial borders. The left ventricular internal cavity size was normal in size. There is no left ventricular hypertrophy. Left ventricular diastolic parameters are indeterminate.  Right Ventricle: The right ventricular size is not well visualized. Right vetricular wall thickness was not well visualized. Right ventricular systolic function was not well visualized.  Left Atrium: Left atrial size was normal in size.  Right Atrium: Right atrial size was not well visualized.  Pericardium: There is no evidence of pericardial effusion.  Mitral Valve: The mitral valve was not well visualized. Not well visualized mitral valve regurgitation.  Tricuspid Valve: The tricuspid valve is not well visualized. Tricuspid valve regurgitation not well visualized.  Aortic Valve: The aortic valve was not well visualized. Aortic valve regurgitation not well visualized.  Pulmonic Valve: The pulmonic valve was not well visualized. Pulmonic valve regurgitation not well visualized.  Aorta: The aortic root is normal in size and structure.  Venous: The inferior vena cava was not well visualized.  IAS/Shunts: The interatrial septum was not well visualized.  Additional Comments: Extremely limited; LV function appears to be normal on definity images; color doppler suboptimal.   LEFT VENTRICLE PLAX 2D LVIDd:         4.20 cm   Diastology LVIDs:         2.70 cm   LV e' medial:   10.30 cm/s LV PW:         1.00 cm   LV E/e' medial: 9.3 LV IVS:        0.90 cm LVOT diam:     2.00 cm LVOT Area:     3.14 cm   LEFT ATRIUM           Index LA Vol (A4C): 84.2 ml 35.17 ml/m MITRAL VALVE MV Area (PHT): 4.24 cm    SHUNTS MV Decel Time: 179 msec    Systemic Diam: 2.00 cm MV E velocity: 96.00 cm/s  Olga Millers MD Electronically signed by Olga Millers  MD Signature Date/Time: 06/10/2022/12:17:19 PM    Final             Risk Assessment/Calculations:             Physical Exam:   VS:  BP 127/80   Pulse 86   Ht 5\' 9"  (1.753 m)   Wt 250 lb (113.4 kg)   LMP 01/29/1992   BMI 36.92 kg/m    Wt Readings from Last 3 Encounters:  07/31/22 250 lb (113.4 kg)  07/14/22 243 lb 9.7 oz (110.5 kg)  06/14/22 266 lb 8.6 oz (120.9 kg)    GEN: Well nourished, well developed in no acute distress NECK: No JVD; No carotid bruits CARDIAC: RRR, no murmurs, rubs, gallops RESPIRATORY:  Clear to auscultation without rales, wheezing or rhonchi  ABDOMEN: Soft, non-tender, non-distended EXTREMITIES:  No edema; No deformity   ASSESSMENT AND PLAN: .    Chronic diastolic heart failure- Volume up. Weight up 7 lbs from discharge. GDMT Losartan, Jardiance, Lasix. Rx Spironolactone 12.5mg  every day. BMP in 1 week. Low sodium diet, fluid restriction <2L, and daily weights encouraged. Educated to contact our office for weight gain of 2 lbs overnight or 5 lbs in one week.   HTN- BP at goal <130/80. Continue Losartan 25mg  every day, Lasix 80mg  BID. Due to palpitations, increase Diltiazem from 120mg  every day to BID as previously recommended in hospital stay.   DM2- Continue to follow with PCP.   HLD-continue atorvastatin 40 mg daily  COPD - Resume Trelegy one puff daily. Printed Rx provided for SNF.   Paroxysmal SVT -reports more for palpitations.  On review of EMR from SNF she is taking diltiazem daily instead of twice daily as previously recommended.  Recommend return to diltiazem 120 twice daily, printed prescription provided for SNF.  CKDIIIa - Careful titration of diuretic and antihypertensive.  On Jardiance for renal protection.  BMP In 1 week.  OSA - CPAP compliance encouraged.  Not using SNF which is likely contributory to fatigue.       Dispo: follow up as scheduled with Dr. Cristal Deer  Signed, Alver Sorrow, NP

## 2022-07-31 NOTE — Patient Instructions (Signed)
Medication Instructions:  Your physician has recommended you make the following change in your medication:   RESUME Trelegy one puff daily  ENSURE taking Diltiazem 120mg  BID as prescribed  START Spironolactone 12.5mg  (half tablet) daily *If you need a refill on your cardiac medications before your next appointment, please call your pharmacy*   Lab Work: Your physician recommends that you return for lab work in one week for BMP  Okay to have at facility if still there and fax results to [phone]. If not at facility, lab locations below  Please return for Lab work. You may come to the...   [facility] (3rd floor) [address], [place], [place] [address]  Open: 8am-Noon and 1pm-4:30pm  Please ring the doorbell on the small table when you exit the elevator and the Lab Tech will come get you  [facility] at [facility] [address], [place], [place] [address] Open: 8am-1pm, then 2pm-4:30pm   [facility]- Please see attached locations sheet stapled to your lab work with address and hours.   If you have labs (blood work) drawn today and your tests are completely normal, you will receive your results only by: MyChart Message (if you have MyChart) OR A paper copy in the mail If you have any lab test that is abnormal or we need to change your treatment, we will call you to review the results.  Follow-Up: At [facility], you and your health needs are our priority.  As part of our continuing mission to provide you with exceptional heart care, we have created designated Provider Care Teams.  These Care Teams include your primary Cardiologist (physician) and Advanced Practice Providers (APPs -  Physician Assistants and Nurse Practitioners) who all work together to provide you with the care you need, when you need it.  We recommend signing up for the patient portal called "MyChart".  Sign up information is provided on this After Visit  Summary.  MyChart is used to connect with patients for Virtual Visits (Telemedicine).  Patients are able to view lab/test results, encounter notes, upcoming appointments, etc.  Non-urgent messages can be sent to your provider as well.   To learn more about what you can do with MyChart, go to [email or web address].    Your next appointment:   As scheduled with Dr. Cristal Deer  Other Instructions  Heart Healthy Diet Recommendations: A low-salt diet is recommended. Meats should be grilled, baked, or boiled. Avoid fried foods. Focus on lean protein sources like fish or chicken with vegetables and fruits. The American Heart Association is a Chief Technology Officer!  American Heart Association Diet and Lifeystyle Recommendations  Ensure drinking less than 64 oz (2 liters) of fluid per day  Recommend weighing daily and keeping a log. Please call our office if you have weight gain of 2 pounds overnight or 5 pounds in 1 week.   Date  Time Weight

## 2022-08-01 DIAGNOSIS — I5033 Acute on chronic diastolic (congestive) heart failure: Secondary | ICD-10-CM | POA: Diagnosis not present

## 2022-08-01 DIAGNOSIS — M25512 Pain in left shoulder: Secondary | ICD-10-CM | POA: Diagnosis not present

## 2022-08-01 DIAGNOSIS — G8929 Other chronic pain: Secondary | ICD-10-CM | POA: Diagnosis not present

## 2022-08-01 DIAGNOSIS — M545 Low back pain, unspecified: Secondary | ICD-10-CM | POA: Diagnosis not present

## 2022-08-02 DIAGNOSIS — G8929 Other chronic pain: Secondary | ICD-10-CM | POA: Diagnosis not present

## 2022-08-02 DIAGNOSIS — E1122 Type 2 diabetes mellitus with diabetic chronic kidney disease: Secondary | ICD-10-CM | POA: Diagnosis not present

## 2022-08-02 DIAGNOSIS — R531 Weakness: Secondary | ICD-10-CM | POA: Diagnosis not present

## 2022-08-02 DIAGNOSIS — I5033 Acute on chronic diastolic (congestive) heart failure: Secondary | ICD-10-CM | POA: Diagnosis not present

## 2022-08-05 ENCOUNTER — Ambulatory Visit (HOSPITAL_COMMUNITY)
Admission: RE | Admit: 2022-08-05 | Discharge: 2022-08-05 | Disposition: A | Discharge status: Home / Self Care | Payer: 59 | Source: Ambulatory Visit | Attending: Orthopaedic Surgery | Admitting: Orthopaedic Surgery

## 2022-08-05 ENCOUNTER — Telehealth: Payer: Self-pay

## 2022-08-05 DIAGNOSIS — G8929 Other chronic pain: Secondary | ICD-10-CM | POA: Diagnosis not present

## 2022-08-05 DIAGNOSIS — M25512 Pain in left shoulder: Secondary | ICD-10-CM | POA: Diagnosis not present

## 2022-08-05 DIAGNOSIS — M19012 Primary osteoarthritis, left shoulder: Secondary | ICD-10-CM | POA: Diagnosis not present

## 2022-08-05 DIAGNOSIS — M25511 Pain in right shoulder: Secondary | ICD-10-CM | POA: Insufficient documentation

## 2022-08-05 DIAGNOSIS — M948X1 Other specified disorders of cartilage, shoulder: Secondary | ICD-10-CM | POA: Diagnosis not present

## 2022-08-05 DIAGNOSIS — M19011 Primary osteoarthritis, right shoulder: Secondary | ICD-10-CM | POA: Diagnosis not present

## 2022-08-05 NOTE — Transitions of Care (Post Inpatient/ED Visit) (Unsigned)
   08/05/2022  Name: [name] MRN: [id number] DOB: April 05, 1965  Today's TOC FU Call Status: Today's TOC FU Call Status:: Unsuccessul Call (1st Attempt) Unsuccessful Call (1st Attempt) Date: 08/05/22  Attempted to reach the patient regarding the most recent Inpatient/ED visit.  Follow Up Plan: Additional outreach attempts will be made to reach the patient to complete the Transitions of Care (Post Inpatient/ED visit) call.   Signature Karena Addison, LPN [facility] Nurse Health Advisor Direct Dial [phone]

## 2022-08-06 ENCOUNTER — Other Ambulatory Visit: Payer: Self-pay | Admitting: Student

## 2022-08-06 NOTE — Transitions of Care (Post Inpatient/ED Visit) (Unsigned)
   08/06/2022  Name: [name] MRN: [id number] DOB: 1965-10-05  Today's TOC FU Call Status: Today's TOC FU Call Status:: Unsuccessful Call (2nd Attempt) Unsuccessful Call (1st Attempt) Date: 08/05/22 Unsuccessful Call (2nd Attempt) Date: 08/06/22  Attempted to reach the patient regarding the most recent Inpatient/ED visit.  Follow Up Plan: Additional outreach attempts will be made to reach the patient to complete the Transitions of Care (Post Inpatient/ED visit) call.   Signature Karena Addison, LPN [facility] Nurse Health Advisor Direct Dial [phone]

## 2022-08-07 DIAGNOSIS — K5900 Constipation, unspecified: Secondary | ICD-10-CM | POA: Diagnosis not present

## 2022-08-07 DIAGNOSIS — F32A Depression, unspecified: Secondary | ICD-10-CM | POA: Diagnosis not present

## 2022-08-07 DIAGNOSIS — N189 Chronic kidney disease, unspecified: Secondary | ICD-10-CM | POA: Diagnosis not present

## 2022-08-07 DIAGNOSIS — I110 Hypertensive heart disease with heart failure: Secondary | ICD-10-CM | POA: Diagnosis not present

## 2022-08-07 DIAGNOSIS — J449 Chronic obstructive pulmonary disease, unspecified: Secondary | ICD-10-CM | POA: Diagnosis not present

## 2022-08-07 DIAGNOSIS — Z7984 Long term (current) use of oral hypoglycemic drugs: Secondary | ICD-10-CM | POA: Diagnosis not present

## 2022-08-07 DIAGNOSIS — E1140 Type 2 diabetes mellitus with diabetic neuropathy, unspecified: Secondary | ICD-10-CM | POA: Diagnosis not present

## 2022-08-07 DIAGNOSIS — G4733 Obstructive sleep apnea (adult) (pediatric): Secondary | ICD-10-CM | POA: Diagnosis not present

## 2022-08-07 DIAGNOSIS — E1122 Type 2 diabetes mellitus with diabetic chronic kidney disease: Secondary | ICD-10-CM | POA: Diagnosis not present

## 2022-08-07 DIAGNOSIS — G8929 Other chronic pain: Secondary | ICD-10-CM | POA: Diagnosis not present

## 2022-08-07 DIAGNOSIS — Z7951 Long term (current) use of inhaled steroids: Secondary | ICD-10-CM | POA: Diagnosis not present

## 2022-08-07 DIAGNOSIS — E785 Hyperlipidemia, unspecified: Secondary | ICD-10-CM | POA: Diagnosis not present

## 2022-08-07 DIAGNOSIS — M1711 Unilateral primary osteoarthritis, right knee: Secondary | ICD-10-CM | POA: Diagnosis not present

## 2022-08-07 DIAGNOSIS — M542 Cervicalgia: Secondary | ICD-10-CM | POA: Diagnosis not present

## 2022-08-07 DIAGNOSIS — K219 Gastro-esophageal reflux disease without esophagitis: Secondary | ICD-10-CM | POA: Diagnosis not present

## 2022-08-07 DIAGNOSIS — J302 Other seasonal allergic rhinitis: Secondary | ICD-10-CM | POA: Diagnosis not present

## 2022-08-07 DIAGNOSIS — Z9181 History of falling: Secondary | ICD-10-CM | POA: Diagnosis not present

## 2022-08-07 DIAGNOSIS — I5033 Acute on chronic diastolic (congestive) heart failure: Secondary | ICD-10-CM | POA: Diagnosis not present

## 2022-08-07 DIAGNOSIS — M549 Dorsalgia, unspecified: Secondary | ICD-10-CM | POA: Diagnosis not present

## 2022-08-07 NOTE — Transitions of Care (Post Inpatient/ED Visit) (Signed)
   08/07/2022  Name: LACE CHENEVERT MRN: [id number] DOB: 06-26-1965  Today's TOC FU Call Status: Today's TOC FU Call Status:: Unsuccessful Call (3rd Attempt) Unsuccessful Call (1st Attempt) Date: 08/05/22 Unsuccessful Call (2nd Attempt) Date: 08/06/22 Unsuccessful Call (3rd Attempt) Date: 08/07/22  Attempted to reach the patient regarding the most recent Inpatient/ED visit.  Follow Up Plan: No further outreach attempts will be made at this time. We have been unable to contact the patient.  Signature Karena Addison, LPN [facility] Nurse Health Advisor Direct Dial [phone]

## 2022-08-13 ENCOUNTER — Telehealth: Payer: Self-pay

## 2022-08-13 NOTE — Telephone Encounter (Signed)
Kara from [facility] calling for nursing verbal orders as follows:  1 time(s) weekly for 4 week(s)  She is also requesting orders for OT evaluation.   Verbal orders given per [facility] protocol  Veronda Prude, RN

## 2022-08-14 ENCOUNTER — Encounter: Payer: Self-pay | Admitting: Student

## 2022-08-14 ENCOUNTER — Inpatient Hospital Stay: Payer: 59 | Admitting: Student

## 2022-08-14 DIAGNOSIS — K219 Gastro-esophageal reflux disease without esophagitis: Secondary | ICD-10-CM | POA: Diagnosis not present

## 2022-08-14 DIAGNOSIS — M542 Cervicalgia: Secondary | ICD-10-CM | POA: Diagnosis not present

## 2022-08-14 DIAGNOSIS — J449 Chronic obstructive pulmonary disease, unspecified: Secondary | ICD-10-CM | POA: Diagnosis not present

## 2022-08-14 DIAGNOSIS — I110 Hypertensive heart disease with heart failure: Secondary | ICD-10-CM | POA: Diagnosis not present

## 2022-08-14 DIAGNOSIS — M549 Dorsalgia, unspecified: Secondary | ICD-10-CM | POA: Diagnosis not present

## 2022-08-14 DIAGNOSIS — Z7951 Long term (current) use of inhaled steroids: Secondary | ICD-10-CM | POA: Diagnosis not present

## 2022-08-14 DIAGNOSIS — E1140 Type 2 diabetes mellitus with diabetic neuropathy, unspecified: Secondary | ICD-10-CM | POA: Diagnosis not present

## 2022-08-14 DIAGNOSIS — K5900 Constipation, unspecified: Secondary | ICD-10-CM | POA: Diagnosis not present

## 2022-08-14 DIAGNOSIS — M1711 Unilateral primary osteoarthritis, right knee: Secondary | ICD-10-CM | POA: Diagnosis not present

## 2022-08-14 DIAGNOSIS — G8929 Other chronic pain: Secondary | ICD-10-CM | POA: Diagnosis not present

## 2022-08-14 DIAGNOSIS — Z7984 Long term (current) use of oral hypoglycemic drugs: Secondary | ICD-10-CM | POA: Diagnosis not present

## 2022-08-14 DIAGNOSIS — I5033 Acute on chronic diastolic (congestive) heart failure: Secondary | ICD-10-CM | POA: Diagnosis not present

## 2022-08-14 DIAGNOSIS — J302 Other seasonal allergic rhinitis: Secondary | ICD-10-CM | POA: Diagnosis not present

## 2022-08-14 DIAGNOSIS — Z9181 History of falling: Secondary | ICD-10-CM | POA: Diagnosis not present

## 2022-08-14 DIAGNOSIS — G4733 Obstructive sleep apnea (adult) (pediatric): Secondary | ICD-10-CM | POA: Diagnosis not present

## 2022-08-14 DIAGNOSIS — E785 Hyperlipidemia, unspecified: Secondary | ICD-10-CM | POA: Diagnosis not present

## 2022-08-14 DIAGNOSIS — F32A Depression, unspecified: Secondary | ICD-10-CM | POA: Diagnosis not present

## 2022-08-14 DIAGNOSIS — N189 Chronic kidney disease, unspecified: Secondary | ICD-10-CM | POA: Diagnosis not present

## 2022-08-14 DIAGNOSIS — E1122 Type 2 diabetes mellitus with diabetic chronic kidney disease: Secondary | ICD-10-CM | POA: Diagnosis not present

## 2022-08-15 ENCOUNTER — Ambulatory Visit: Payer: 59 | Admitting: Orthopedic Surgery

## 2022-08-15 ENCOUNTER — Encounter (HOSPITAL_BASED_OUTPATIENT_CLINIC_OR_DEPARTMENT_OTHER): Payer: Self-pay

## 2022-08-15 DIAGNOSIS — J302 Other seasonal allergic rhinitis: Secondary | ICD-10-CM | POA: Diagnosis not present

## 2022-08-15 DIAGNOSIS — Z7984 Long term (current) use of oral hypoglycemic drugs: Secondary | ICD-10-CM | POA: Diagnosis not present

## 2022-08-15 DIAGNOSIS — M1711 Unilateral primary osteoarthritis, right knee: Secondary | ICD-10-CM | POA: Diagnosis not present

## 2022-08-15 DIAGNOSIS — E785 Hyperlipidemia, unspecified: Secondary | ICD-10-CM | POA: Diagnosis not present

## 2022-08-15 DIAGNOSIS — E1122 Type 2 diabetes mellitus with diabetic chronic kidney disease: Secondary | ICD-10-CM | POA: Diagnosis not present

## 2022-08-15 DIAGNOSIS — I5033 Acute on chronic diastolic (congestive) heart failure: Secondary | ICD-10-CM | POA: Diagnosis not present

## 2022-08-15 DIAGNOSIS — I110 Hypertensive heart disease with heart failure: Secondary | ICD-10-CM | POA: Diagnosis not present

## 2022-08-15 DIAGNOSIS — Z9181 History of falling: Secondary | ICD-10-CM | POA: Diagnosis not present

## 2022-08-15 DIAGNOSIS — Z7951 Long term (current) use of inhaled steroids: Secondary | ICD-10-CM | POA: Diagnosis not present

## 2022-08-15 DIAGNOSIS — G4733 Obstructive sleep apnea (adult) (pediatric): Secondary | ICD-10-CM | POA: Diagnosis not present

## 2022-08-15 DIAGNOSIS — F32A Depression, unspecified: Secondary | ICD-10-CM | POA: Diagnosis not present

## 2022-08-15 DIAGNOSIS — K219 Gastro-esophageal reflux disease without esophagitis: Secondary | ICD-10-CM | POA: Diagnosis not present

## 2022-08-15 DIAGNOSIS — N189 Chronic kidney disease, unspecified: Secondary | ICD-10-CM | POA: Diagnosis not present

## 2022-08-15 DIAGNOSIS — M549 Dorsalgia, unspecified: Secondary | ICD-10-CM | POA: Diagnosis not present

## 2022-08-15 DIAGNOSIS — M542 Cervicalgia: Secondary | ICD-10-CM | POA: Diagnosis not present

## 2022-08-15 DIAGNOSIS — E1140 Type 2 diabetes mellitus with diabetic neuropathy, unspecified: Secondary | ICD-10-CM | POA: Diagnosis not present

## 2022-08-15 DIAGNOSIS — J449 Chronic obstructive pulmonary disease, unspecified: Secondary | ICD-10-CM | POA: Diagnosis not present

## 2022-08-15 DIAGNOSIS — K5900 Constipation, unspecified: Secondary | ICD-10-CM | POA: Diagnosis not present

## 2022-08-15 DIAGNOSIS — G8929 Other chronic pain: Secondary | ICD-10-CM | POA: Diagnosis not present

## 2022-08-19 ENCOUNTER — Inpatient Hospital Stay: Payer: 59 | Admitting: Student

## 2022-08-20 ENCOUNTER — Other Ambulatory Visit: Payer: Self-pay | Admitting: Student

## 2022-08-20 DIAGNOSIS — K219 Gastro-esophageal reflux disease without esophagitis: Secondary | ICD-10-CM | POA: Diagnosis not present

## 2022-08-20 DIAGNOSIS — G4733 Obstructive sleep apnea (adult) (pediatric): Secondary | ICD-10-CM | POA: Diagnosis not present

## 2022-08-20 DIAGNOSIS — M549 Dorsalgia, unspecified: Secondary | ICD-10-CM | POA: Diagnosis not present

## 2022-08-20 DIAGNOSIS — E785 Hyperlipidemia, unspecified: Secondary | ICD-10-CM | POA: Diagnosis not present

## 2022-08-20 DIAGNOSIS — F32A Depression, unspecified: Secondary | ICD-10-CM | POA: Diagnosis not present

## 2022-08-20 DIAGNOSIS — J449 Chronic obstructive pulmonary disease, unspecified: Secondary | ICD-10-CM | POA: Diagnosis not present

## 2022-08-20 DIAGNOSIS — E1140 Type 2 diabetes mellitus with diabetic neuropathy, unspecified: Secondary | ICD-10-CM | POA: Diagnosis not present

## 2022-08-20 DIAGNOSIS — K5900 Constipation, unspecified: Secondary | ICD-10-CM | POA: Diagnosis not present

## 2022-08-20 DIAGNOSIS — Z7984 Long term (current) use of oral hypoglycemic drugs: Secondary | ICD-10-CM | POA: Diagnosis not present

## 2022-08-20 DIAGNOSIS — M542 Cervicalgia: Secondary | ICD-10-CM | POA: Diagnosis not present

## 2022-08-20 DIAGNOSIS — J302 Other seasonal allergic rhinitis: Secondary | ICD-10-CM | POA: Diagnosis not present

## 2022-08-20 DIAGNOSIS — I5033 Acute on chronic diastolic (congestive) heart failure: Secondary | ICD-10-CM | POA: Diagnosis not present

## 2022-08-20 DIAGNOSIS — Z7951 Long term (current) use of inhaled steroids: Secondary | ICD-10-CM | POA: Diagnosis not present

## 2022-08-20 DIAGNOSIS — I5032 Chronic diastolic (congestive) heart failure: Secondary | ICD-10-CM

## 2022-08-20 DIAGNOSIS — G8929 Other chronic pain: Secondary | ICD-10-CM | POA: Diagnosis not present

## 2022-08-20 DIAGNOSIS — I110 Hypertensive heart disease with heart failure: Secondary | ICD-10-CM | POA: Diagnosis not present

## 2022-08-20 DIAGNOSIS — M1711 Unilateral primary osteoarthritis, right knee: Secondary | ICD-10-CM | POA: Diagnosis not present

## 2022-08-20 DIAGNOSIS — N189 Chronic kidney disease, unspecified: Secondary | ICD-10-CM | POA: Diagnosis not present

## 2022-08-20 DIAGNOSIS — Z9181 History of falling: Secondary | ICD-10-CM | POA: Diagnosis not present

## 2022-08-20 DIAGNOSIS — E1122 Type 2 diabetes mellitus with diabetic chronic kidney disease: Secondary | ICD-10-CM | POA: Diagnosis not present

## 2022-08-21 DIAGNOSIS — G4733 Obstructive sleep apnea (adult) (pediatric): Secondary | ICD-10-CM | POA: Diagnosis not present

## 2022-08-21 DIAGNOSIS — J302 Other seasonal allergic rhinitis: Secondary | ICD-10-CM | POA: Diagnosis not present

## 2022-08-21 DIAGNOSIS — I110 Hypertensive heart disease with heart failure: Secondary | ICD-10-CM | POA: Diagnosis not present

## 2022-08-21 DIAGNOSIS — Z7951 Long term (current) use of inhaled steroids: Secondary | ICD-10-CM | POA: Diagnosis not present

## 2022-08-21 DIAGNOSIS — M542 Cervicalgia: Secondary | ICD-10-CM | POA: Diagnosis not present

## 2022-08-21 DIAGNOSIS — M549 Dorsalgia, unspecified: Secondary | ICD-10-CM | POA: Diagnosis not present

## 2022-08-21 DIAGNOSIS — Z9181 History of falling: Secondary | ICD-10-CM | POA: Diagnosis not present

## 2022-08-21 DIAGNOSIS — E1122 Type 2 diabetes mellitus with diabetic chronic kidney disease: Secondary | ICD-10-CM | POA: Diagnosis not present

## 2022-08-21 DIAGNOSIS — G8929 Other chronic pain: Secondary | ICD-10-CM | POA: Diagnosis not present

## 2022-08-21 DIAGNOSIS — E1140 Type 2 diabetes mellitus with diabetic neuropathy, unspecified: Secondary | ICD-10-CM | POA: Diagnosis not present

## 2022-08-21 DIAGNOSIS — N189 Chronic kidney disease, unspecified: Secondary | ICD-10-CM | POA: Diagnosis not present

## 2022-08-21 DIAGNOSIS — Z7984 Long term (current) use of oral hypoglycemic drugs: Secondary | ICD-10-CM | POA: Diagnosis not present

## 2022-08-21 DIAGNOSIS — J449 Chronic obstructive pulmonary disease, unspecified: Secondary | ICD-10-CM | POA: Diagnosis not present

## 2022-08-21 DIAGNOSIS — M1711 Unilateral primary osteoarthritis, right knee: Secondary | ICD-10-CM | POA: Diagnosis not present

## 2022-08-21 DIAGNOSIS — F32A Depression, unspecified: Secondary | ICD-10-CM | POA: Diagnosis not present

## 2022-08-21 DIAGNOSIS — K5900 Constipation, unspecified: Secondary | ICD-10-CM | POA: Diagnosis not present

## 2022-08-21 DIAGNOSIS — K219 Gastro-esophageal reflux disease without esophagitis: Secondary | ICD-10-CM | POA: Diagnosis not present

## 2022-08-21 DIAGNOSIS — E785 Hyperlipidemia, unspecified: Secondary | ICD-10-CM | POA: Diagnosis not present

## 2022-08-21 DIAGNOSIS — I5033 Acute on chronic diastolic (congestive) heart failure: Secondary | ICD-10-CM | POA: Diagnosis not present

## 2022-08-22 DIAGNOSIS — I5033 Acute on chronic diastolic (congestive) heart failure: Secondary | ICD-10-CM | POA: Diagnosis not present

## 2022-08-22 DIAGNOSIS — J449 Chronic obstructive pulmonary disease, unspecified: Secondary | ICD-10-CM | POA: Diagnosis not present

## 2022-08-22 DIAGNOSIS — Z9181 History of falling: Secondary | ICD-10-CM | POA: Diagnosis not present

## 2022-08-22 DIAGNOSIS — N189 Chronic kidney disease, unspecified: Secondary | ICD-10-CM | POA: Diagnosis not present

## 2022-08-22 DIAGNOSIS — G4733 Obstructive sleep apnea (adult) (pediatric): Secondary | ICD-10-CM | POA: Diagnosis not present

## 2022-08-22 DIAGNOSIS — Z7984 Long term (current) use of oral hypoglycemic drugs: Secondary | ICD-10-CM | POA: Diagnosis not present

## 2022-08-22 DIAGNOSIS — M1711 Unilateral primary osteoarthritis, right knee: Secondary | ICD-10-CM | POA: Diagnosis not present

## 2022-08-22 DIAGNOSIS — G8929 Other chronic pain: Secondary | ICD-10-CM | POA: Diagnosis not present

## 2022-08-22 DIAGNOSIS — K5900 Constipation, unspecified: Secondary | ICD-10-CM | POA: Diagnosis not present

## 2022-08-22 DIAGNOSIS — E785 Hyperlipidemia, unspecified: Secondary | ICD-10-CM | POA: Diagnosis not present

## 2022-08-22 DIAGNOSIS — M542 Cervicalgia: Secondary | ICD-10-CM | POA: Diagnosis not present

## 2022-08-22 DIAGNOSIS — J302 Other seasonal allergic rhinitis: Secondary | ICD-10-CM | POA: Diagnosis not present

## 2022-08-22 DIAGNOSIS — K219 Gastro-esophageal reflux disease without esophagitis: Secondary | ICD-10-CM | POA: Diagnosis not present

## 2022-08-22 DIAGNOSIS — I110 Hypertensive heart disease with heart failure: Secondary | ICD-10-CM | POA: Diagnosis not present

## 2022-08-22 DIAGNOSIS — E1122 Type 2 diabetes mellitus with diabetic chronic kidney disease: Secondary | ICD-10-CM | POA: Diagnosis not present

## 2022-08-22 DIAGNOSIS — E1140 Type 2 diabetes mellitus with diabetic neuropathy, unspecified: Secondary | ICD-10-CM | POA: Diagnosis not present

## 2022-08-22 DIAGNOSIS — Z7951 Long term (current) use of inhaled steroids: Secondary | ICD-10-CM | POA: Diagnosis not present

## 2022-08-22 DIAGNOSIS — F32A Depression, unspecified: Secondary | ICD-10-CM | POA: Diagnosis not present

## 2022-08-22 DIAGNOSIS — M549 Dorsalgia, unspecified: Secondary | ICD-10-CM | POA: Diagnosis not present

## 2022-08-27 ENCOUNTER — Inpatient Hospital Stay: Payer: 59 | Admitting: Student

## 2022-08-27 ENCOUNTER — Ambulatory Visit (INDEPENDENT_AMBULATORY_CARE_PROVIDER_SITE_OTHER): Payer: 59 | Admitting: Orthopaedic Surgery

## 2022-08-27 DIAGNOSIS — M25511 Pain in right shoulder: Secondary | ICD-10-CM | POA: Diagnosis not present

## 2022-08-27 DIAGNOSIS — M25512 Pain in left shoulder: Secondary | ICD-10-CM | POA: Diagnosis not present

## 2022-08-27 DIAGNOSIS — G8929 Other chronic pain: Secondary | ICD-10-CM | POA: Diagnosis not present

## 2022-08-27 NOTE — Progress Notes (Signed)
Office Visit Note   Patient: KERTINA MEHRER           Date of Birth: 02-03-1965           MRN: [id number] Visit Date: 08/27/2022              Requested by: Jerre Simon, MD [address] [place],  [place] [address] PCP: Jerre Simon, MD   Assessment & Plan: Visit Diagnoses:  1. Chronic pain of both shoulders     Plan: Patient is a 57 year old female with bilateral rotator cuff tears which appear chronic on the MRIs.  Patient is having trouble with ambulation due to recent medical issues and hospitalization.  Patient would not be a good candidate for upper extremity surgery and I do not feel that the rotator cuff tears are repairable.  We will place an order for PT/OT for her shoulders.  We will see her back as needed.  She is currently enrolled in a pain clinic.  Total face to face encounter time was greater than 25 minutes and over half of this time was spent in counseling and/or coordination of care.  Follow-Up Instructions: No follow-ups on file.   Orders:  No orders of the defined types were placed in this encounter.  No orders of the defined types were placed in this encounter.     Procedures: No procedures performed   Clinical Data: No additional findings.   Subjective: Chief Complaint  Patient presents with   Right Shoulder - Pain   Left Shoulder - Pain    HPI Patient returns today to discuss bilateral shoulder MRI scans. Review of Systems  Constitutional: Negative.   HENT: Negative.    Eyes: Negative.   Respiratory: Negative.    Cardiovascular: Negative.   Endocrine: Negative.   Musculoskeletal: Negative.   Neurological: Negative.   Hematological: Negative.   Psychiatric/Behavioral: Negative.    All other systems reviewed and are negative.    Objective: Vital Signs: LMP 01/29/1992   Physical Exam Vitals and nursing note reviewed.  Constitutional:      Appearance: She is well-developed.  HENT:     Head: Normocephalic and atraumatic.   Pulmonary:     Effort: Pulmonary effort is normal.  Abdominal:     Palpations: Abdomen is soft.  Musculoskeletal:     Cervical back: Neck supple.  Skin:    General: Skin is warm.     Capillary Refill: Capillary refill takes less than 2 seconds.  Neurological:     Mental Status: She is alert and oriented to person, place, and time.  Psychiatric:        Behavior: Behavior normal.        Thought Content: Thought content normal.        Judgment: Judgment normal.     Ortho Exam Patient uses a rollator for ambulation with bilateral platform forearm weightbearing attachments.  Examination of bilateral shoulders is unchanged.  Specialty Comments:  No specialty comments available.  Imaging: No results found.   PMFS History: Patient Active Problem List   Diagnosis Date Noted   CHF (congestive heart failure) (HCC) 07/11/2022   Acute on chronic diastolic congestive heart failure (HCC) 07/10/2022   Acute on chronic heart failure with preserved ejection fraction (HFpEF) (HCC) 06/09/2022   Acute exacerbation of CHF (congestive heart failure) (HCC) 06/09/2022   Panniculitis 12/04/2021   Infection of lip 08/27/2021   Lumbar stenosis with neurogenic claudication 06/29/2021   Lumbar radiculopathy 06/29/2021   Biceps tendinopathy of  right upper extremity 05/03/2021   Irregular heartbeat 04/11/2021   Neuropathic pain of foot 03/28/2021   Chronic congestion of paranasal sinus 03/28/2021   Impingement syndrome of right shoulder 03/08/2021   Arthritis of right acromioclavicular joint 03/08/2021   Acute medial meniscus tear of right knee 03/08/2021   Acute lateral meniscus tear of right knee 03/08/2021   Spinal stenosis of thoracic region with radiculopathy    Bilateral leg numbness    Spinal stenosis of lumbar region with neurogenic claudication    Primary osteoarthritis of right knee    Impaired ambulation 01/10/2021   Status post lumbar laminectomy 10/03/2020   Cubital tunnel  syndrome on left 05/02/2020   Vaginal discharge 01/20/2020   Prolapse of anterior vaginal wall 11/10/2019   Skin ulcer of left thigh, limited to breakdown of skin (HCC) 09/09/2019   Ulnar nerve compression 08/06/2019   Diabetic neuropathy associated with type 2 diabetes mellitus (HCC) 08/06/2019   Body mass index 50.0-59.9, adult (HCC) 06/12/2019   Impingement syndrome of left shoulder 05/07/2019   Polyp of colon    Polyp of ascending colon    Rectal discomfort 02/12/2019   Osteoarthritis of right hip 02/12/2019   Nontraumatic tear of left supraspinatus tendon 12/30/2018   Gout 12/17/2018   Nontraumatic complete tear of right rotator cuff 12/08/2018   Rotator cuff tear, right 12/01/2018   Tobacco abuse 08/21/2018   Right groin pain 07/20/2018   Depressed mood 07/13/2018   Paroxysmal SVT (supraventricular tachycardia) 03/20/2018   Pure hypercholesterolemia 03/20/2018   Muscle cramping 02/25/2018   Nodule of upper lobe of right lung 02/13/2018   Falls, subsequent encounter 01/30/2018   Cervical post-laminectomy syndrome 11/13/2017   Pain in left foot 11/04/2017   Essential hypertension 10/27/2017   Solitary pulmonary nodule 10/07/2017   Restrictive lung disease secondary to obesity 10/01/2017   Polycythemia, secondary 10/01/2017   Chronic viral hepatitis B without delta-agent (HCC) 07/08/2017   COPD (chronic obstructive pulmonary disease) with chronic bronchitis 06/27/2017   Chronic kidney disease (CKD), stage IV (severe) (HCC) 01/14/2017   Decubitus ulcer 12/13/2016   Type 2 diabetes mellitus with stage 3 chronic kidney disease, with long-term current use of insulin (HCC) 12/12/2016   Urge incontinence of urine 12/05/2016   Trigger finger, left index finger 07/15/2016   Back pain 04/07/2014   Morbid obesity (HCC) 10/02/2012   Chronic diastolic CHF (congestive heart failure) (HCC) 04/07/2012   Skin lesion of scalp 03/15/2011   GERD 01/26/2010   Obstructive sleep apnea treated  with BiPAP 02/03/2008   FIBROCYSTIC BREAST DISEASE 10/28/2006   Hyperlipidemia 03/27/2006   Obesity hypoventilation syndrome (HCC) 03/27/2006   Depression with anxiety 03/27/2006   Past Medical History:  Diagnosis Date   Alcoholism in remission (HCC)    11-26-2021  pt stated no alcohol few yrs ago   Asthma    Bipolar disorder (HCC)    Chronic diastolic CHF (congestive heart failure) (HCC) 2014   followed by dr b. Cristal Deer;   Chronic hypoxemic respiratory failure [facility])    pulmonologist--- dr Tonia Brooms;   (11-26-2021 per pt was on supplemental oxygen until she 01/ 2022 did not need it,  does not check O2 sats at home anymore.   CKD (chronic kidney disease), stage III [facility])    nephrologist--- dr Ronalee Belts   COPD (chronic obstructive pulmonary disease) (HCC)    11-26-2021 pt trying to quit smoking ,  started age 82   Depression    Diabetes mellitus type 2, diet-controlled (HCC)  followed by pcp   (11-26-2021  per pt last A1c by pcp on 10-30-2021 7.8,  stated does not check blood sugar)   Dyspnea    Fibrocystic breast disease    GAD (generalized anxiety disorder)    GERD (gastroesophageal reflux disease)    Hepatitis B surface antigen positive 11/2017   pt stated asymptomatic ,  no treatment   History of cervical dysplasia 04/10/2011   '93- once dx.-got pregnant-no intervention, then postpartum, no dysplasia found   History of condyloma acuminatum 04/09/2011   Hyperlipidemia    Hypertension    Incomplete left bundle branch block (LBBB)    Lung nodule 11/2017   pulmonologist--- dr Tonia Brooms;  RUL and RLL   Muscle spasm of both lower legs    chronic , due to back issues,   OA (osteoarthritis) 04/10/2011   hips, shoulders, back   OSA (obstructive sleep apnea) 2009   study on epic 09-18-2007 mild osa w/ hypoxmia ;   (11-26-2021 per pt last used cpap 01/ 2022 before bariatric surgery 04/ 2022 stated lost wt / and stopped needing supplemental oxygen , did not need anymore)    Polyneuropathy    due to back issues and DM   Polysubstance abuse (HCC)    11-26-2021  pt hx cocaine use stated none for yrs but ED visit 08-14-2021  positive UDS,  pt stated it was once only for depression none since   PSVT (paroxysmal supraventricular tachycardia)    followed by cardiology   Spinal stenosis, unspecified spinal region    Urge incontinence of urine    Wears dentures    upper    Family History  Problem Relation Age of Onset   Pulmonary embolism Mother    Stroke Father    Alcohol abuse Father    Pneumonia Father    Hypertension Father    Breast cancer Maternal Aunt    Hypertension Other    Diabetes Other        Aunts and cousins   Asthma Sister    Depression Sister    Arthritis Sister    Sickle cell anemia Son     Past Surgical History:  Procedure Laterality Date   ANAL FISTULECTOMY  04/17/2011   Procedure: FISTULECTOMY ANAL;  Surgeon: Wilmon Arms. Corliss Skains, MD;  Location: [facility];  Service: General;  Laterality: N/A;  Excision of Condyloma Gluteal Cleft    ANAL RECTAL MANOMETRY N/A 03/26/2019   Procedure: ANO RECTAL MANOMETRY;  Surgeon: Tressia Danas, MD;  Location: [facility];  Service: Gastroenterology;  Laterality: N/A;   BIOPSY  03/09/2019   Procedure: BIOPSY;  Surgeon: Tressia Danas, MD;  Location: [facility];  Service: Gastroenterology;;   BOTOX INJECTION N/A 12/29/2018   Procedure: BOTOX INJECTION(100 UNITS)  WITH CYSTOSCOPY;  Surgeon: Rene Paci, MD;  Location: [facility];  Service: Urology;  Laterality: N/A;   BOTOX INJECTION N/A 07/27/2019   Procedure: BOTOX INJECTION WITH CYSTOSCOPY;  Surgeon: Rene Paci, MD;  Location: [facility];  Service: Urology;  Laterality: N/A;  ONLY NEEDS 30 MIN   BOTOX INJECTION N/A 05/02/2021   Procedure: BOTOX INJECTION WITH CYSTOSCOPY;  Surgeon: Rene Paci, MD;  Location: [facility];  Service: Urology;  Laterality: N/A;  ONLY NEEDS 30 MIN   BOTOX INJECTION N/A  11/28/2021   Procedure: BOTOX INJECTION WITH CYSTOSCOPY, 200 UNITS;  Surgeon: Rene Paci, MD;  Location: [facility];  Service: Urology;  Laterality: N/A;  ONLY NEEDS 30 MIN  BOTOX INJECTION N/A 05/15/2022   Procedure: BOTOX INJECTION 200 UNITS;  Surgeon: Rene Paci, MD;  Location: [facility];  Service: Urology;  Laterality: N/A;   BREAST EXCISIONAL BIOPSY Right    x 3   BREAST EXCISIONAL BIOPSY Left    12-29-2000  and 11-30-2001 @[facility]  by dr Carolynne Edouard   CARPAL TUNNEL RELEASE Bilateral    right 08-30-2002 and left 07-04-2003  by dr Otelia Sergeant @ [facility]   CATARACT EXTRACTION W/ INTRAOCULAR LENS IMPLANT Bilateral 12/2020   CERVICAL FUSION  04/10/2011   x3- cervical fusion with plating and screws-Dr. Noel Gerold   COLONOSCOPY WITH PROPOFOL N/A 07/06/2015   Procedure: COLONOSCOPY WITH PROPOFOL;  Surgeon: Dorena Cookey, MD;  [facility]: [facility];  Service: Endoscopy;  Laterality: N/A;   COLONOSCOPY WITH PROPOFOL N/A 03/09/2019   Procedure: COLONOSCOPY WITH PROPOFOL;  Surgeon: Tressia Danas, MD;  Location: [facility];  Service: Gastroenterology;  Laterality: N/A;   CYSTOSCOPY N/A 05/15/2022   Procedure: CYSTOSCOPY;  Surgeon: Rene Paci, MD;  Location: [facility];  Service: Urology;  Laterality: N/A;   FRACTURE SURGERY     little toe left foot   I & D EXTREMITY Left 12/18/2016   Procedure: IRRIGATION AND DEBRIDEMENT LEFT FOOT, CLOSURE;  Surgeon: Tarry Kos, MD;  Location: [facility];  Service: Orthopedics;  Laterality: Left;   INCISION AND DRAINAGE ABSCESS Right 05/03/2002   @[facility]  by dr Carolynne Edouard;   right breast abscess   INJECTION KNEE Right 10/03/2020   Procedure: KNEE INJECTION;  Surgeon: Kerrin Champagne, MD;  Location: [facility];  Service: Orthopedics;  Laterality: Right;   KNEE ARTHROSCOPY WITH MEDIAL MENISECTOMY Right 03/21/2021   Procedure: RIGHT KNEE ARTHROSCOPY WITH MEDIAL AND PARTIAL LATERAL MENISCECTOMY;  Surgeon: Tarry Kos,  MD;  Location: [facility];  Service: Orthopedics;  Laterality: Right;   LAPAROSCOPIC GASTRIC SLEEVE RESECTION N/A 05/08/2020   Procedure: LAPAROSCOPIC GASTRIC SLEEVE RESECTION;  Surgeon: Sheliah Hatch De Blanch, MD;  Location: [facility];  Service: General;  Laterality: N/A;   LUMBAR DISC SURGERY  04/10/2011   x5-Lumbar fusion-retained hardware.(Dr. Otelia Sergeant)   LUMBAR LAMINECTOMY N/A 10/03/2020   Procedure: THORACOLUMBAR LAMINECTOMIES THORACIC ELEVEN-TWELVE, THORACIC TWELVE-LUMBAR ONE  AND LUMBAT ONE-TWO;  Surgeon: Kerrin Champagne, MD;  Location: [facility];  Service: Orthopedics;  Laterality: N/A;   LUMBAR PERCUTANEOUS PEDICLE SCREW 2 LEVEL  06/29/2021   Procedure: LUMBAR PERCUTANEOUS PEDICLE SCREW LUMBAR ONE-TWO;  Surgeon: Julio Sicks, MD;  Location: [facility];  Service: Neurosurgery;;   MULTIPLE TOOTH EXTRACTIONS     POLYPECTOMY  03/09/2019   Procedure: POLYPECTOMY;  Surgeon: Tressia Danas, MD;  [facility];  Service: Gastroenterology;;   POSTERIOR LUMBAR FUSION  04/04/2005   @[facility]  by dr Otelia Sergeant;   L3-- L5   POSTERIOR LUMBAR FUSION  03/04/2006   @[facility]  dr Otelia Sergeant;   fusion L2--3 and redo L3--5   SHOULDER ARTHROSCOPY WITH ROTATOR CUFF REPAIR AND SUBACROMIAL DECOMPRESSION Left 05/07/2019   Procedure: LEFT SHOULDER ARTHROSCOPY WITH DEBRIDEMENT, DISTAL CLAVICLE EXCISION,  SUBACROMIAL DECOMPRESSION AND POSSIBLE ROTATOR CUFF REPAIR;  Surgeon: Tarry Kos, MD;  Location: [facility];  Service: Orthopedics;  Laterality: Left;   TEE WITHOUT CARDIOVERSION N/A 04/06/2012   Procedure: TRANSESOPHAGEAL ECHOCARDIOGRAM (TEE);  Surgeon: Chrystie Nose, MD;  Location: [facility];  Service: Cardiovascular;  Laterality: N/A;   TRIGGER FINGER RELEASE Right    middle finger   TRIGGER FINGER RELEASE Left 06/28/2016   Procedure: RELEASE TRIGGER FINGER LEFT 3RD FINGER;  Surgeon: Gershon Mussel  M, MD;  Location: [facility];  Service: Orthopedics;  Laterality: Left;   TRIGGER FINGER RELEASE Right 06/12/2016   Procedure:  RIGHT INDEX FINGER TRIGGER RELEASE;  Surgeon: Tarry Kos, MD;  Location: [facility];  Service: Orthopedics;  Laterality: Right;   TRIGGER FINGER RELEASE Right 01/19/2018   Procedure: RIGHT RING FINGER TRIGGER FINGER RELEASE;  Surgeon: Tarry Kos, MD;  Location: [facility];  Service: Orthopedics;  Laterality: Right;   TRIGGER FINGER RELEASE Left 05/07/2019   Procedure: RELEASE TRIGGER FINGER LEFT INDEX FINGER;  Surgeon: Tarry Kos, MD;  [facility];  Service: Orthopedics;  Laterality: Left;   UPPER GI ENDOSCOPY N/A 05/08/2020   Procedure: UPPER GI ENDOSCOPY;  Surgeon: Sheliah Hatch, De Blanch, MD;  Location: [facility];  Service: General;  Laterality: N/A;   Social History   Occupational History   Occupation: disabled    Comment: Disabled  Tobacco Use   Smoking status: Every Day    Current packs/day: 0.50    Average packs/day: 0.5 packs/day for 45.6 years (22.8 ttl pk-yrs)    Types: Cigarettes    Start date: 01/28/1977   Smokeless tobacco: Never  Vaping Use   Vaping status: Never Used  Substance and Sexual Activity   Alcohol use: Not Currently    Comment: hx alcoholism,  (11-26-2021 per pt no alcohol in a while)   Drug use: Not Currently    Comment: 11-26-2021  (hx cocaine use) positive UDS in epic 08-14-2021  pt stated only did it once due to depression 07/ 2023 none since;  per pt last marijuana yrs ago   Sexual activity: Not Currently    Partners: Male    Birth control/protection: Post-menopausal

## 2022-08-28 ENCOUNTER — Ambulatory Visit (INDEPENDENT_AMBULATORY_CARE_PROVIDER_SITE_OTHER): Payer: 59 | Admitting: Student

## 2022-08-28 ENCOUNTER — Encounter: Payer: Self-pay | Admitting: Student

## 2022-08-28 VITALS — BP 166/96 | HR 63 | Wt 231.0 lb

## 2022-08-28 DIAGNOSIS — Z9181 History of falling: Secondary | ICD-10-CM | POA: Diagnosis not present

## 2022-08-28 DIAGNOSIS — M4804 Spinal stenosis, thoracic region: Secondary | ICD-10-CM | POA: Diagnosis not present

## 2022-08-28 DIAGNOSIS — M542 Cervicalgia: Secondary | ICD-10-CM | POA: Diagnosis not present

## 2022-08-28 DIAGNOSIS — K5900 Constipation, unspecified: Secondary | ICD-10-CM | POA: Diagnosis not present

## 2022-08-28 DIAGNOSIS — I5033 Acute on chronic diastolic (congestive) heart failure: Secondary | ICD-10-CM | POA: Diagnosis not present

## 2022-08-28 DIAGNOSIS — N189 Chronic kidney disease, unspecified: Secondary | ICD-10-CM | POA: Diagnosis not present

## 2022-08-28 DIAGNOSIS — M1711 Unilateral primary osteoarthritis, right knee: Secondary | ICD-10-CM | POA: Diagnosis not present

## 2022-08-28 DIAGNOSIS — Z7951 Long term (current) use of inhaled steroids: Secondary | ICD-10-CM | POA: Diagnosis not present

## 2022-08-28 DIAGNOSIS — E785 Hyperlipidemia, unspecified: Secondary | ICD-10-CM | POA: Diagnosis not present

## 2022-08-28 DIAGNOSIS — G8929 Other chronic pain: Secondary | ICD-10-CM | POA: Diagnosis not present

## 2022-08-28 DIAGNOSIS — M5414 Radiculopathy, thoracic region: Secondary | ICD-10-CM | POA: Diagnosis not present

## 2022-08-28 DIAGNOSIS — G4733 Obstructive sleep apnea (adult) (pediatric): Secondary | ICD-10-CM | POA: Diagnosis not present

## 2022-08-28 DIAGNOSIS — E1140 Type 2 diabetes mellitus with diabetic neuropathy, unspecified: Secondary | ICD-10-CM | POA: Diagnosis not present

## 2022-08-28 DIAGNOSIS — F32A Depression, unspecified: Secondary | ICD-10-CM | POA: Diagnosis not present

## 2022-08-28 DIAGNOSIS — E1122 Type 2 diabetes mellitus with diabetic chronic kidney disease: Secondary | ICD-10-CM | POA: Diagnosis not present

## 2022-08-28 DIAGNOSIS — K219 Gastro-esophageal reflux disease without esophagitis: Secondary | ICD-10-CM | POA: Diagnosis not present

## 2022-08-28 DIAGNOSIS — J302 Other seasonal allergic rhinitis: Secondary | ICD-10-CM | POA: Diagnosis not present

## 2022-08-28 DIAGNOSIS — M5416 Radiculopathy, lumbar region: Secondary | ICD-10-CM | POA: Diagnosis not present

## 2022-08-28 DIAGNOSIS — J449 Chronic obstructive pulmonary disease, unspecified: Secondary | ICD-10-CM | POA: Diagnosis not present

## 2022-08-28 DIAGNOSIS — I110 Hypertensive heart disease with heart failure: Secondary | ICD-10-CM | POA: Diagnosis not present

## 2022-08-28 DIAGNOSIS — M549 Dorsalgia, unspecified: Secondary | ICD-10-CM | POA: Diagnosis not present

## 2022-08-28 DIAGNOSIS — Z7984 Long term (current) use of oral hypoglycemic drugs: Secondary | ICD-10-CM | POA: Diagnosis not present

## 2022-08-28 MED ORDER — DICLOFENAC SODIUM 1 % EX GEL: CUTANEOUS | 0 refills | Status: DC

## 2022-08-28 MED ORDER — OXYCODONE HCL 5 MG PO TABS
5.0000 mg | ORAL_TABLET | ORAL | 0 refills | Status: DC | PRN
Start: 2022-08-28 — End: 2022-09-13

## 2022-08-28 NOTE — Progress Notes (Signed)
    SUBJECTIVE:   CHIEF COMPLAINT / HPI:   Patient is a 57 year old female presenting today for rehab/ PT follow up. She was recently at a nursing facilty where she was getting PT after progress she was sent home with home PT/OT. She endorses some improvement but still have significant pain on the lower back affecting her mobility.  Previously following pain clinic for Buprenorphine treatment however  couldn't continue treatment with the pain clinic because she was treated with Oxycodone during her PT/OT at the rehab facility.  Per patient she was previously treated at [facility] who did her surgery in June 2023.  Today we are also managing her treatment at that time with oxycodone.  However patient wanted to wean off narcotics and requested to initiate buprenorphine treatment with the pain clinic.  He stated the injections at the pain clinic was not as effective as the oral medication buprenorphine.  Notes patient has decreased her gabapentin to 100 mg 3 times daily because the 200 mg makes her excessively sleepy.  She has an appointment scheduled with [facility] to assess her back pain on 8/8.  PERTINENT  PMH / PSH: Reviewed   OBJECTIVE:   BP (!) 166/96   Pulse 63   Wt 231 lb (104.8 kg)   LMP 01/29/1992   SpO2 98%   BMI 34.11 kg/m    Physical Exam General: Laying in fetal position, Appears distressed  Cardiovascular: RRR, No Murmurs, Normal S2/S2 Respiratory: CTAB, No wheezing or Rales Abdomen: No distension or tenderness Extremities: Unable to participate in strength exam due to pain, walks with a walker  ASSESSMENT/PLAN:   Lumbar radiculopathy Has been a chronic issue for patient.  Currently getting home PT/OT and on Gabapentin with some improvement but pain is not adequately controlled. She has required PRN oxycodone recently from Urgent care to improve pain and reduce withdrawal symptoms since she is no longer on the buprenorphine after end in management with  the pain clinic. - Rx  low-dose of oxycodone 5 mg every 4 hours as needed for pain management and prevention of withdrawal to cover patient until her appointment with [facility] on 8/8.   - Plan is to continue her gabapentin and physical therapy. -Refilled her Voltaren gel - Will consider clonidine should patient starts showing signs of withdrawal.   - Placed referral to pain management clinic.   Jerre Simon, MD [facility]

## 2022-08-28 NOTE — Assessment & Plan Note (Addendum)
Has been a chronic issue for patient.  Currently getting home PT/OT and on Gabapentin with some improvement but pain is not adequately controlled. She has required PRN oxycodone recently from Urgent care to improve pain and reduce withdrawal symptoms since she is no longer on the buprenorphine after end in management with the pain clinic. - Rx  low-dose of oxycodone 5 mg every 4 hours as needed for pain management and prevention of withdrawal to cover patient until her appointment with Ortho [facility] on 8/8.   - Plan is to continue her gabapentin and physical therapy. -Refilled her Voltaren gel - Will consider clonidine should patient starts showing signs of withdrawal.   - Placed referral to pain management clinic.

## 2022-08-28 NOTE — Patient Instructions (Signed)
It was wonderful to see you today. Thank you for allowing me to be a part of your care. Below is a short summary of what we discussed at your visit today:  For your back pain I have refilled your oxycodone to a reduced dose of 5 mg maximum 4 times a day to cover you until you see [facility] on 8/8.  Continue to use your gabapentin and Voltaren gel for pain management.  Sent in a refill of your Voltaren gel to your pharmacy.  I have also sent in referral to pain management clinic.  Please bring all of your medications to every appointment!  If you have any questions or concerns, please do not hesitate to contact us via phone or MyChart message.   Jerre Simon, MD [facility]

## 2022-08-29 ENCOUNTER — Telehealth: Payer: Self-pay

## 2022-08-29 DIAGNOSIS — Z9181 History of falling: Secondary | ICD-10-CM | POA: Diagnosis not present

## 2022-08-29 DIAGNOSIS — K5900 Constipation, unspecified: Secondary | ICD-10-CM | POA: Diagnosis not present

## 2022-08-29 DIAGNOSIS — E1122 Type 2 diabetes mellitus with diabetic chronic kidney disease: Secondary | ICD-10-CM | POA: Diagnosis not present

## 2022-08-29 DIAGNOSIS — Z7984 Long term (current) use of oral hypoglycemic drugs: Secondary | ICD-10-CM | POA: Diagnosis not present

## 2022-08-29 DIAGNOSIS — N189 Chronic kidney disease, unspecified: Secondary | ICD-10-CM | POA: Diagnosis not present

## 2022-08-29 DIAGNOSIS — M549 Dorsalgia, unspecified: Secondary | ICD-10-CM | POA: Diagnosis not present

## 2022-08-29 DIAGNOSIS — G8929 Other chronic pain: Secondary | ICD-10-CM | POA: Diagnosis not present

## 2022-08-29 DIAGNOSIS — M542 Cervicalgia: Secondary | ICD-10-CM | POA: Diagnosis not present

## 2022-08-29 DIAGNOSIS — I5033 Acute on chronic diastolic (congestive) heart failure: Secondary | ICD-10-CM | POA: Diagnosis not present

## 2022-08-29 DIAGNOSIS — G4733 Obstructive sleep apnea (adult) (pediatric): Secondary | ICD-10-CM | POA: Diagnosis not present

## 2022-08-29 DIAGNOSIS — E1140 Type 2 diabetes mellitus with diabetic neuropathy, unspecified: Secondary | ICD-10-CM | POA: Diagnosis not present

## 2022-08-29 DIAGNOSIS — I110 Hypertensive heart disease with heart failure: Secondary | ICD-10-CM | POA: Diagnosis not present

## 2022-08-29 DIAGNOSIS — J302 Other seasonal allergic rhinitis: Secondary | ICD-10-CM | POA: Diagnosis not present

## 2022-08-29 DIAGNOSIS — M1711 Unilateral primary osteoarthritis, right knee: Secondary | ICD-10-CM | POA: Diagnosis not present

## 2022-08-29 DIAGNOSIS — E785 Hyperlipidemia, unspecified: Secondary | ICD-10-CM | POA: Diagnosis not present

## 2022-08-29 DIAGNOSIS — K219 Gastro-esophageal reflux disease without esophagitis: Secondary | ICD-10-CM | POA: Diagnosis not present

## 2022-08-29 DIAGNOSIS — J449 Chronic obstructive pulmonary disease, unspecified: Secondary | ICD-10-CM | POA: Diagnosis not present

## 2022-08-29 DIAGNOSIS — F32A Depression, unspecified: Secondary | ICD-10-CM | POA: Diagnosis not present

## 2022-08-29 DIAGNOSIS — Z7951 Long term (current) use of inhaled steroids: Secondary | ICD-10-CM | POA: Diagnosis not present

## 2022-08-29 NOTE — Telephone Encounter (Signed)
Received call from patient and home health nurse, Mirica regarding patient's pain.   RN reports patient having 10/10 pain in left shoulder, hip and back.   She states that she was previously being prescribed 10 mg oxycodone for management of pain. Patient is upset as she states that she was not aware that oxycodone was being decreased to 5 mg.   Advised that per note from yesterday, provider was sending short term prescription to last patient until follow up with the specialist.   Patient and RN are requesting increased dose of 10 mg oxycodone to be sent to pharmacy.   RN states that she is trying to keep patient out of the ED due to uncontrolled pain.   Please advise.   Veronda Prude, RN

## 2022-08-31 ENCOUNTER — Encounter: Payer: Self-pay | Admitting: Student

## 2022-08-31 DIAGNOSIS — K219 Gastro-esophageal reflux disease without esophagitis: Secondary | ICD-10-CM

## 2022-09-02 ENCOUNTER — Telehealth: Payer: Self-pay

## 2022-09-02 ENCOUNTER — Other Ambulatory Visit: Payer: Self-pay | Admitting: Student

## 2022-09-02 MED ORDER — CLONIDINE HCL 0.1 MG PO TABS: 0.1000 mg | ORAL_TABLET | Freq: Two times a day (BID) | ORAL | 0 refills | Status: DC

## 2022-09-02 NOTE — Progress Notes (Signed)
Placed an order for Clonidine to prevent withdrawal symptoms for patient previously on Opioid prescribed by other provider. She is no longer being managed by the pain clinic. Will take clonidine 0.1mg  BID.

## 2022-09-02 NOTE — Telephone Encounter (Signed)
Spoke to [name] and informed her of message from Dr. Elliot Gurney.  Glennie Hawk, CMA

## 2022-09-02 NOTE — Telephone Encounter (Signed)
Mirica calling for nursing verbal orders as follows:  1 time(s) weekly for 5 week(s)  Start of care on 08/29/22.  Verbal orders given per [facility] protocol  Veronda Prude, RN

## 2022-09-03 DIAGNOSIS — M549 Dorsalgia, unspecified: Secondary | ICD-10-CM | POA: Diagnosis not present

## 2022-09-03 DIAGNOSIS — J9611 Chronic respiratory failure with hypoxia: Secondary | ICD-10-CM | POA: Diagnosis not present

## 2022-09-03 DIAGNOSIS — J302 Other seasonal allergic rhinitis: Secondary | ICD-10-CM | POA: Diagnosis not present

## 2022-09-03 DIAGNOSIS — E1142 Type 2 diabetes mellitus with diabetic polyneuropathy: Secondary | ICD-10-CM | POA: Diagnosis not present

## 2022-09-03 DIAGNOSIS — E785 Hyperlipidemia, unspecified: Secondary | ICD-10-CM | POA: Diagnosis not present

## 2022-09-03 DIAGNOSIS — M542 Cervicalgia: Secondary | ICD-10-CM | POA: Diagnosis not present

## 2022-09-03 DIAGNOSIS — G8929 Other chronic pain: Secondary | ICD-10-CM | POA: Diagnosis not present

## 2022-09-03 DIAGNOSIS — E1122 Type 2 diabetes mellitus with diabetic chronic kidney disease: Secondary | ICD-10-CM | POA: Diagnosis not present

## 2022-09-03 DIAGNOSIS — K5900 Constipation, unspecified: Secondary | ICD-10-CM | POA: Diagnosis not present

## 2022-09-03 DIAGNOSIS — I447 Left bundle-branch block, unspecified: Secondary | ICD-10-CM | POA: Diagnosis not present

## 2022-09-03 DIAGNOSIS — N183 Chronic kidney disease, stage 3 unspecified: Secondary | ICD-10-CM | POA: Diagnosis not present

## 2022-09-03 DIAGNOSIS — I110 Hypertensive heart disease with heart failure: Secondary | ICD-10-CM | POA: Diagnosis not present

## 2022-09-03 DIAGNOSIS — M1711 Unilateral primary osteoarthritis, right knee: Secondary | ICD-10-CM | POA: Diagnosis not present

## 2022-09-03 DIAGNOSIS — G4733 Obstructive sleep apnea (adult) (pediatric): Secondary | ICD-10-CM | POA: Diagnosis not present

## 2022-09-03 DIAGNOSIS — I5033 Acute on chronic diastolic (congestive) heart failure: Secondary | ICD-10-CM | POA: Diagnosis not present

## 2022-09-03 DIAGNOSIS — J4489 Other specified chronic obstructive pulmonary disease: Secondary | ICD-10-CM | POA: Diagnosis not present

## 2022-09-03 DIAGNOSIS — R911 Solitary pulmonary nodule: Secondary | ICD-10-CM | POA: Diagnosis not present

## 2022-09-03 DIAGNOSIS — I471 Supraventricular tachycardia, unspecified: Secondary | ICD-10-CM | POA: Diagnosis not present

## 2022-09-03 DIAGNOSIS — K219 Gastro-esophageal reflux disease without esophagitis: Secondary | ICD-10-CM | POA: Diagnosis not present

## 2022-09-03 DIAGNOSIS — M4800 Spinal stenosis, site unspecified: Secondary | ICD-10-CM | POA: Diagnosis not present

## 2022-09-05 ENCOUNTER — Ambulatory Visit: Payer: 59 | Admitting: Orthopedic Surgery

## 2022-09-06 DIAGNOSIS — E1122 Type 2 diabetes mellitus with diabetic chronic kidney disease: Secondary | ICD-10-CM | POA: Diagnosis not present

## 2022-09-06 DIAGNOSIS — K5900 Constipation, unspecified: Secondary | ICD-10-CM | POA: Diagnosis not present

## 2022-09-06 DIAGNOSIS — G8929 Other chronic pain: Secondary | ICD-10-CM | POA: Diagnosis not present

## 2022-09-06 DIAGNOSIS — J302 Other seasonal allergic rhinitis: Secondary | ICD-10-CM | POA: Diagnosis not present

## 2022-09-06 DIAGNOSIS — R911 Solitary pulmonary nodule: Secondary | ICD-10-CM | POA: Diagnosis not present

## 2022-09-06 DIAGNOSIS — N183 Chronic kidney disease, stage 3 unspecified: Secondary | ICD-10-CM | POA: Diagnosis not present

## 2022-09-06 DIAGNOSIS — I471 Supraventricular tachycardia, unspecified: Secondary | ICD-10-CM | POA: Diagnosis not present

## 2022-09-06 DIAGNOSIS — I5033 Acute on chronic diastolic (congestive) heart failure: Secondary | ICD-10-CM | POA: Diagnosis not present

## 2022-09-06 DIAGNOSIS — J9611 Chronic respiratory failure with hypoxia: Secondary | ICD-10-CM | POA: Diagnosis not present

## 2022-09-06 DIAGNOSIS — K219 Gastro-esophageal reflux disease without esophagitis: Secondary | ICD-10-CM | POA: Diagnosis not present

## 2022-09-06 DIAGNOSIS — I447 Left bundle-branch block, unspecified: Secondary | ICD-10-CM | POA: Diagnosis not present

## 2022-09-06 DIAGNOSIS — M549 Dorsalgia, unspecified: Secondary | ICD-10-CM | POA: Diagnosis not present

## 2022-09-06 DIAGNOSIS — E785 Hyperlipidemia, unspecified: Secondary | ICD-10-CM | POA: Diagnosis not present

## 2022-09-06 DIAGNOSIS — M542 Cervicalgia: Secondary | ICD-10-CM | POA: Diagnosis not present

## 2022-09-06 DIAGNOSIS — G4733 Obstructive sleep apnea (adult) (pediatric): Secondary | ICD-10-CM | POA: Diagnosis not present

## 2022-09-06 DIAGNOSIS — M4800 Spinal stenosis, site unspecified: Secondary | ICD-10-CM | POA: Diagnosis not present

## 2022-09-06 DIAGNOSIS — E1142 Type 2 diabetes mellitus with diabetic polyneuropathy: Secondary | ICD-10-CM | POA: Diagnosis not present

## 2022-09-06 DIAGNOSIS — M1711 Unilateral primary osteoarthritis, right knee: Secondary | ICD-10-CM | POA: Diagnosis not present

## 2022-09-06 DIAGNOSIS — J4489 Other specified chronic obstructive pulmonary disease: Secondary | ICD-10-CM | POA: Diagnosis not present

## 2022-09-06 DIAGNOSIS — I110 Hypertensive heart disease with heart failure: Secondary | ICD-10-CM | POA: Diagnosis not present

## 2022-09-08 ENCOUNTER — Other Ambulatory Visit: Payer: Self-pay

## 2022-09-08 ENCOUNTER — Emergency Department (HOSPITAL_COMMUNITY): Payer: 59

## 2022-09-08 ENCOUNTER — Inpatient Hospital Stay (HOSPITAL_COMMUNITY)
Admission: EM | Admit: 2022-09-08 | Discharge: 2022-09-13 | DRG: 896 | Disposition: A | Discharge status: Home / Self Care | Payer: 59 | Attending: Family Medicine | Admitting: Family Medicine

## 2022-09-08 DIAGNOSIS — Z5911 Inadequate housing environmental temperature: Secondary | ICD-10-CM

## 2022-09-08 DIAGNOSIS — R4182 Altered mental status, unspecified: Secondary | ICD-10-CM | POA: Diagnosis not present

## 2022-09-08 DIAGNOSIS — R34 Anuria and oliguria: Secondary | ICD-10-CM | POA: Diagnosis not present

## 2022-09-08 DIAGNOSIS — E039 Hypothyroidism, unspecified: Secondary | ICD-10-CM | POA: Diagnosis present

## 2022-09-08 DIAGNOSIS — E871 Hypo-osmolality and hyponatremia: Secondary | ICD-10-CM | POA: Diagnosis not present

## 2022-09-08 DIAGNOSIS — M549 Dorsalgia, unspecified: Secondary | ICD-10-CM | POA: Diagnosis not present

## 2022-09-08 DIAGNOSIS — I10 Essential (primary) hypertension: Secondary | ICD-10-CM

## 2022-09-08 DIAGNOSIS — D751 Secondary polycythemia: Secondary | ICD-10-CM | POA: Diagnosis present

## 2022-09-08 DIAGNOSIS — I130 Hypertensive heart and chronic kidney disease with heart failure and stage 1 through stage 4 chronic kidney disease, or unspecified chronic kidney disease: Secondary | ICD-10-CM | POA: Diagnosis not present

## 2022-09-08 DIAGNOSIS — E1165 Type 2 diabetes mellitus with hyperglycemia: Secondary | ICD-10-CM | POA: Diagnosis present

## 2022-09-08 DIAGNOSIS — I499 Cardiac arrhythmia, unspecified: Secondary | ICD-10-CM | POA: Diagnosis not present

## 2022-09-08 DIAGNOSIS — I5032 Chronic diastolic (congestive) heart failure: Secondary | ICD-10-CM | POA: Diagnosis not present

## 2022-09-08 DIAGNOSIS — E872 Acidosis, unspecified: Secondary | ICD-10-CM | POA: Diagnosis not present

## 2022-09-08 DIAGNOSIS — I674 Hypertensive encephalopathy: Secondary | ICD-10-CM | POA: Diagnosis present

## 2022-09-08 DIAGNOSIS — G4733 Obstructive sleep apnea (adult) (pediatric): Secondary | ICD-10-CM | POA: Diagnosis present

## 2022-09-08 DIAGNOSIS — M159 Polyosteoarthritis, unspecified: Secondary | ICD-10-CM | POA: Diagnosis present

## 2022-09-08 DIAGNOSIS — F411 Generalized anxiety disorder: Secondary | ICD-10-CM | POA: Diagnosis present

## 2022-09-08 DIAGNOSIS — G40901 Epilepsy, unspecified, not intractable, with status epilepticus: Secondary | ICD-10-CM | POA: Diagnosis not present

## 2022-09-08 DIAGNOSIS — Z961 Presence of intraocular lens: Secondary | ICD-10-CM | POA: Diagnosis present

## 2022-09-08 DIAGNOSIS — J9601 Acute respiratory failure with hypoxia: Secondary | ICD-10-CM | POA: Diagnosis not present

## 2022-09-08 DIAGNOSIS — E870 Hyperosmolality and hypernatremia: Secondary | ICD-10-CM | POA: Diagnosis not present

## 2022-09-08 DIAGNOSIS — Z87410 Personal history of cervical dysplasia: Secondary | ICD-10-CM

## 2022-09-08 DIAGNOSIS — R6889 Other general symptoms and signs: Secondary | ICD-10-CM | POA: Diagnosis not present

## 2022-09-08 DIAGNOSIS — F418 Other specified anxiety disorders: Secondary | ICD-10-CM | POA: Diagnosis present

## 2022-09-08 DIAGNOSIS — J9691 Respiratory failure, unspecified with hypoxia: Secondary | ICD-10-CM | POA: Diagnosis not present

## 2022-09-08 DIAGNOSIS — R569 Unspecified convulsions: Secondary | ICD-10-CM | POA: Diagnosis present

## 2022-09-08 DIAGNOSIS — J449 Chronic obstructive pulmonary disease, unspecified: Secondary | ICD-10-CM | POA: Diagnosis not present

## 2022-09-08 DIAGNOSIS — G4489 Other headache syndrome: Secondary | ICD-10-CM | POA: Diagnosis not present

## 2022-09-08 DIAGNOSIS — G8929 Other chronic pain: Secondary | ICD-10-CM | POA: Diagnosis present

## 2022-09-08 DIAGNOSIS — Z981 Arthrodesis status: Secondary | ICD-10-CM

## 2022-09-08 DIAGNOSIS — F1021 Alcohol dependence, in remission: Secondary | ICD-10-CM | POA: Diagnosis present

## 2022-09-08 DIAGNOSIS — Z6834 Body mass index (BMI) 34.0-34.9, adult: Secondary | ICD-10-CM

## 2022-09-08 DIAGNOSIS — Z811 Family history of alcohol abuse and dependence: Secondary | ICD-10-CM

## 2022-09-08 DIAGNOSIS — F319 Bipolar disorder, unspecified: Secondary | ICD-10-CM | POA: Diagnosis present

## 2022-09-08 DIAGNOSIS — Z8249 Family history of ischemic heart disease and other diseases of the circulatory system: Secondary | ICD-10-CM

## 2022-09-08 DIAGNOSIS — F1193 Opioid use, unspecified with withdrawal: Secondary | ICD-10-CM

## 2022-09-08 DIAGNOSIS — I517 Cardiomegaly: Secondary | ICD-10-CM | POA: Diagnosis not present

## 2022-09-08 DIAGNOSIS — E1122 Type 2 diabetes mellitus with diabetic chronic kidney disease: Secondary | ICD-10-CM | POA: Diagnosis present

## 2022-09-08 DIAGNOSIS — N184 Chronic kidney disease, stage 4 (severe): Secondary | ICD-10-CM | POA: Diagnosis present

## 2022-09-08 DIAGNOSIS — Z885 Allergy status to narcotic agent status: Secondary | ICD-10-CM

## 2022-09-08 DIAGNOSIS — R519 Headache, unspecified: Secondary | ICD-10-CM | POA: Insufficient documentation

## 2022-09-08 DIAGNOSIS — F17210 Nicotine dependence, cigarettes, uncomplicated: Secondary | ICD-10-CM | POA: Diagnosis present

## 2022-09-08 DIAGNOSIS — F1123 Opioid dependence with withdrawal: Secondary | ICD-10-CM | POA: Diagnosis not present

## 2022-09-08 DIAGNOSIS — K219 Gastro-esophageal reflux disease without esophagitis: Secondary | ICD-10-CM | POA: Diagnosis present

## 2022-09-08 DIAGNOSIS — E669 Obesity, unspecified: Secondary | ICD-10-CM | POA: Diagnosis present

## 2022-09-08 DIAGNOSIS — Z886 Allergy status to analgesic agent status: Secondary | ICD-10-CM

## 2022-09-08 DIAGNOSIS — Z743 Need for continuous supervision: Secondary | ICD-10-CM | POA: Diagnosis not present

## 2022-09-08 DIAGNOSIS — E440 Moderate protein-calorie malnutrition: Secondary | ICD-10-CM | POA: Diagnosis not present

## 2022-09-08 DIAGNOSIS — I161 Hypertensive emergency: Secondary | ICD-10-CM | POA: Diagnosis present

## 2022-09-08 DIAGNOSIS — Z7951 Long term (current) use of inhaled steroids: Secondary | ICD-10-CM

## 2022-09-08 DIAGNOSIS — M5416 Radiculopathy, lumbar region: Secondary | ICD-10-CM | POA: Diagnosis present

## 2022-09-08 DIAGNOSIS — E785 Hyperlipidemia, unspecified: Secondary | ICD-10-CM | POA: Diagnosis present

## 2022-09-08 DIAGNOSIS — Z79899 Other long term (current) drug therapy: Secondary | ICD-10-CM

## 2022-09-08 DIAGNOSIS — Z9884 Bariatric surgery status: Secondary | ICD-10-CM

## 2022-09-08 DIAGNOSIS — E1142 Type 2 diabetes mellitus with diabetic polyneuropathy: Secondary | ICD-10-CM | POA: Diagnosis present

## 2022-09-08 DIAGNOSIS — R9431 Abnormal electrocardiogram [ECG] [EKG]: Secondary | ICD-10-CM | POA: Diagnosis not present

## 2022-09-08 DIAGNOSIS — Z881 Allergy status to other antibiotic agents status: Secondary | ICD-10-CM

## 2022-09-08 LAB — COMPREHENSIVE METABOLIC PANEL
ALT: 17 U/L (ref 0–44)
AST: 28 U/L (ref 15–41)
Albumin: 3.9 g/dL (ref 3.5–5.0)
Alkaline Phosphatase: 85 U/L (ref 38–126)
Anion gap: 18 — ABNORMAL HIGH (ref 5–15)
BUN: 19 mg/dL (ref 6–20)
CO2: 21 mmol/L — ABNORMAL LOW (ref 22–32)
Calcium: 10.1 mg/dL (ref 8.9–10.3)
Chloride: 102 mmol/L (ref 98–111)
Creatinine, Ser: 1.58 mg/dL — ABNORMAL HIGH (ref 0.44–1.00)
GFR, Estimated: 37 mL/min — ABNORMAL LOW (ref >60)
Glucose, Bld: 115 mg/dL — ABNORMAL HIGH (ref 70–99)
Potassium: 4.8 mmol/L (ref 3.5–5.1)
Sodium: 141 mmol/L (ref 135–145)
Total Bilirubin: 1.7 mg/dL — ABNORMAL HIGH (ref 0.3–1.2)
Total Protein: 7.3 g/dL (ref 6.5–8.1)

## 2022-09-08 LAB — CBC
HCT: 57.8 % — ABNORMAL HIGH (ref 36.0–46.0)
Hemoglobin: 19.9 g/dL — ABNORMAL HIGH (ref 12.0–15.0)
MCH: 26.2 pg (ref 26.0–34.0)
MCHC: 34.4 g/dL (ref 30.0–36.0)
MCV: 76.1 fL — ABNORMAL LOW (ref 80.0–100.0)
Platelets: 245 10*3/uL (ref 150–400)
RBC: 7.6 MIL/uL — ABNORMAL HIGH (ref 3.87–5.11)
RDW: 22.4 % — ABNORMAL HIGH (ref 11.5–15.5)
WBC: 12.6 10*3/uL — ABNORMAL HIGH (ref 4.0–10.5)
nRBC: 0 % (ref 0.0–0.2)

## 2022-09-08 LAB — I-STAT CG4 LACTIC ACID, ED: Lactic Acid, Venous: 2.1 mmol/L (ref 0.5–1.9)

## 2022-09-08 LAB — I-STAT VENOUS BLOOD GAS, ED
Acid-base deficit: 1 mmol/L (ref 0.0–2.0)
Bicarbonate: 24.5 mmol/L (ref 20.0–28.0)
Calcium, Ion: 1.09 mmol/L — ABNORMAL LOW (ref 1.15–1.40)
HCT: 60 % — ABNORMAL HIGH (ref 36.0–46.0)
Hemoglobin: 20.4 g/dL — ABNORMAL HIGH (ref 12.0–15.0)
O2 Saturation: 89 %
Patient temperature: 37
Potassium: 5.9 mmol/L — ABNORMAL HIGH (ref 3.5–5.1)
Sodium: 140 mmol/L (ref 135–145)
TCO2: 26 mmol/L (ref 22–32)
pCO2, Ven: 43.5 mmHg — ABNORMAL LOW (ref 44–60)
pH, Ven: 7.359 (ref 7.25–7.43)
pO2, Ven: 58 mmHg — ABNORMAL HIGH (ref 32–45)

## 2022-09-08 LAB — TROPONIN I (HIGH SENSITIVITY): Troponin I (High Sensitivity): 2 ng/L (ref <18)

## 2022-09-08 LAB — CK: Total CK: 76 U/L (ref 38–234)

## 2022-09-08 LAB — ETHANOL: Alcohol, Ethyl (B): <10 mg/dL (ref <10)

## 2022-09-08 LAB — CBG MONITORING, ED: Glucose-Capillary: 146 mg/dL — ABNORMAL HIGH (ref 70–99)

## 2022-09-08 LAB — PROTIME-INR
INR: 1 (ref 0.8–1.2)
Prothrombin Time: 13.5 seconds (ref 11.4–15.2)

## 2022-09-08 MED ORDER — LACTATED RINGERS IV BOLUS
1000.0000 mL | Freq: Once | INTRAVENOUS | Status: AC
  Administered 2022-09-08: 1000 mL via INTRAVENOUS

## 2022-09-08 MED ORDER — SODIUM CHLORIDE 0.9 % IV BOLUS: 1000.0000 mL | Freq: Once | INTRAVENOUS | Status: DC

## 2022-09-08 NOTE — ED Triage Notes (Signed)
Pt BIBEMS from home w/ AMS. Son went to check on pt and found her disoriented. LKW yesterday @ 9am

## 2022-09-08 NOTE — ED Provider Notes (Incomplete)
[place] EMERGENCY DEPARTMENT AT [facility] Provider Note   CSN: [id number] Arrival date & time: 09/08/22  2153     History {Add pertinent medical, surgical, social history, OB history to HPI:1} Chief Complaint  Patient presents with   Altered Mental Status    [name] is a 57 y.o. female.  HPI     Home Medications Prior to Admission medications   Medication Sig Start Date End Date Taking? Authorizing Provider  acetaminophen (TYLENOL) 500 MG tablet Take 1,000 mg by mouth 2 (two) times daily.    [provider]  albuterol (VENTOLIN HFA) 108 (90 Base) MCG/ACT inhaler INHALE TWO puffs into THE lungs EVERY SIX HOURS AS NEEDED wheezind/ For SHORTNESS OF BREATH related TO acute respiratory failure WITH hypoxia 08/20/22   Jerre Simon, MD  ammonium lactate (LAC-HYDRIN) 12 % lotion For dry skin Patient taking differently: Apply 1 Application topically 2 (two) times daily. For dry skin 08/29/21   Jerre Simon, MD  atorvastatin (LIPITOR) 40 MG tablet TAKE ONE TABLET BY MOUTH EVERY DAY Patient taking differently: Take 40 mg by mouth daily. 05/02/22   Jerre Simon, MD  baclofen (LIORESAL) 10 MG tablet Take 10 mg by mouth 3 (three) times daily.    [provider]  Buprenorphine HCl 900 MCG FILM Place 1 Film inside cheek 2 (two) times daily.    [provider]  busPIRone (BUSPAR) 10 MG tablet TAKE ONE TABLET BY MOUTH THREE TIMES DAILY Patient taking differently: Take 10 mg by mouth 3 (three) times daily. 02/19/22   Jerre Simon, MD  calcium carbonate (OSCAL) 1500 (600 Ca) MG TABS tablet Take 1,500 mg by mouth 2 (two) times daily.    [provider]  cloNIDine (CATAPRES) 0.1 MG tablet Take 1 tablet (0.1 mg total) by mouth 2 (two) times daily. 09/02/22 11/01/22  Jerre Simon, MD  diclofenac Sodium (VOLTAREN) 1 % GEL APPLY TWO grams topically FOUR TIMES DAILY 08/28/22   Jerre Simon, MD  DILT-XR 120 MG 24 hr capsule TAKE ONE CAPSULE BY MOUTH  TWICE DAILY related TO essential Primary hypertension 08/20/22   Jerre Simon, MD  diltiazem (CARDIZEM CD) 120 MG 24 hr capsule Take 1 capsule (120 mg total) by mouth 2 (two) times daily. 07/31/22   Alver Sorrow, NP  fluticasone (FLONASE) 50 MCG/ACT nasal spray INSTILL TWO SPRAYS INTO BOTH NOSTRILS TWICE DAILY Patient taking differently: Place 2 sprays into both nostrils 2 (two) times daily. INSTILL TWO SPRAYS INTO BOTH NOSTRILS TWICE DAILY 10/03/21   Jerre Simon, MD  Fluticasone-Umeclidin-Vilant (TRELEGY ELLIPTA) 100-62.5-25 MCG/ACT AEPB Inhale 1 puff into the lungs daily. 07/31/22   Alver Sorrow, NP  furosemide (LASIX) 80 MG tablet TAKE ONE TABLET BY MOUTH TWICE DAILY related TO acute ON systolic (congestive heart failure) 08/20/22   Jerre Simon, MD  gabapentin (NEURONTIN) 300 MG capsule Take 300 mg by mouth 2 (two) times daily. 07/23/22   [provider]  gabapentin (NEURONTIN) 300 MG capsule Take 600 mg by mouth at bedtime.    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    [provider]  loratadine (CLARITIN) 10 MG tablet TAKE ONE TABLET BY MOUTH EVERY DAY FOR allergies 08/20/22   Jerre Simon, MD  losartan (COZAAR) 25  MG tablet Take 1 tablet (25 mg total) by mouth at bedtime. Patient taking differently: Take 25 mg by mouth at bedtime. 12/04/21   Jerre Simon, MD  montelukast (SINGULAIR) 10 MG tablet TAKE 1 TABLET BY MOUTH EVERYDAY AT BEDTIME Strength: 10 mg Patient taking differently: Take 10 mg by mouth in the morning. 08/15/21   Jerre Simon, MD  Multiple Vitamins-Minerals (BARIATRIC MULTIVITAMINS/IRON) CAPS Take 1 capsule by mouth 2 (two) times daily.    [provider]  NARCAN 4 MG/0.1ML LIQD nasal spray kit Place 0.4 mg into the  nose once as needed (accidental overdose). 02/16/18   [provider]  nystatin (MYCOSTATIN) 100000 UNIT/ML suspension Take 5 mLs (500,000 Units total) by mouth 4 (four) times daily. Swish and swallow. Patient taking differently: Take 5 mLs by mouth 4 (four) times daily as needed (qid prn). Swish and swallow. 03/27/22   Jerre Simon, MD  oxyCODONE (OXY IR/ROXICODONE) 5 MG immediate release tablet Take 1 tablet (5 mg total) by mouth every 4 (four) hours as needed (sever back pain). 08/28/22   Jerre Simon, MD  spironolactone (ALDACTONE) 25 MG tablet give ONE-HALF TABLET BY MOUTH EVERY DAY FOR chronic diastolic (congestive heart failure) 08/20/22   Jerre Simon, MD  venlafaxine XR (EFFEXOR-XR) 75 MG 24 hr capsule TAKE ONE CAPSULE BY MOUTH EVERY DAY related TO major depressive disorder 08/20/22   Jerre Simon, MD      Allergies    Aspirin, Methadone hcl, and Levofloxacin    Review of Systems   Review of Systems  Physical Exam Updated Vital Signs BP (!) 189/121   Pulse 100   Temp (!) 97.5 F (36.4 C) (Oral)   Resp 17   Ht 5\' 9"  (1.753 m)   Wt 105.1 kg   LMP 01/29/1992   SpO2 100%   BMI 34.22 kg/m  Physical Exam  ED Results / Procedures / Treatments   Labs (all labs ordered are listed, but only abnormal results are displayed) Labs Reviewed  CBG MONITORING, ED - Abnormal; Notable for the following components:      Result Value   Glucose-Capillary 146 (*)    All other components within normal limits  COMPREHENSIVE METABOLIC PANEL  CBC  PROTIME-INR    EKG None  Radiology No results found.  Procedures Procedures  {Document cardiac monitor, telemetry assessment procedure when appropriate:1}  Medications Ordered in ED Medications - No data to display  ED Course/ Medical Decision Making/ A&P   {   Click here for ABCD2, HEART and other calculatorsREFRESH Note before signing :1}                              Medical Decision Making Amount and/or Complexity of  Data Reviewed Labs: ordered.   ***  {Document critical care time when appropriate:1} {Document review of labs and clinical decision tools ie heart score, Chads2Vasc2 etc:1}  {Document your independent review of radiology images, and any outside records:1} {Document your discussion with family members, caretakers, and with consultants:1} {Document social determinants of health affecting pt's care:1} {Document your decision making why or why not admission, treatments were needed:1} Final Clinical Impression(s) / ED Diagnoses Final diagnoses:  None    Rx / DC Orders ED Discharge Orders     None

## 2022-09-09 ENCOUNTER — Inpatient Hospital Stay (HOSPITAL_COMMUNITY): Payer: 59

## 2022-09-09 ENCOUNTER — Ambulatory Visit (HOSPITAL_BASED_OUTPATIENT_CLINIC_OR_DEPARTMENT_OTHER): Payer: 59 | Admitting: Cardiology

## 2022-09-09 ENCOUNTER — Observation Stay (HOSPITAL_COMMUNITY): Payer: 59

## 2022-09-09 DIAGNOSIS — I10 Essential (primary) hypertension: Secondary | ICD-10-CM | POA: Diagnosis not present

## 2022-09-09 DIAGNOSIS — R34 Anuria and oliguria: Secondary | ICD-10-CM | POA: Diagnosis not present

## 2022-09-09 DIAGNOSIS — E440 Moderate protein-calorie malnutrition: Secondary | ICD-10-CM | POA: Insufficient documentation

## 2022-09-09 DIAGNOSIS — D751 Secondary polycythemia: Secondary | ICD-10-CM | POA: Diagnosis present

## 2022-09-09 DIAGNOSIS — I6782 Cerebral ischemia: Secondary | ICD-10-CM | POA: Diagnosis not present

## 2022-09-09 DIAGNOSIS — E1122 Type 2 diabetes mellitus with diabetic chronic kidney disease: Secondary | ICD-10-CM | POA: Diagnosis present

## 2022-09-09 DIAGNOSIS — N184 Chronic kidney disease, stage 4 (severe): Secondary | ICD-10-CM | POA: Diagnosis present

## 2022-09-09 DIAGNOSIS — F319 Bipolar disorder, unspecified: Secondary | ICD-10-CM | POA: Diagnosis present

## 2022-09-09 DIAGNOSIS — F1193 Opioid use, unspecified with withdrawal: Secondary | ICD-10-CM | POA: Diagnosis not present

## 2022-09-09 DIAGNOSIS — E870 Hyperosmolality and hypernatremia: Secondary | ICD-10-CM | POA: Diagnosis present

## 2022-09-09 DIAGNOSIS — Z6834 Body mass index (BMI) 34.0-34.9, adult: Secondary | ICD-10-CM | POA: Diagnosis not present

## 2022-09-09 DIAGNOSIS — G40901 Epilepsy, unspecified, not intractable, with status epilepticus: Secondary | ICD-10-CM | POA: Diagnosis not present

## 2022-09-09 DIAGNOSIS — I130 Hypertensive heart and chronic kidney disease with heart failure and stage 1 through stage 4 chronic kidney disease, or unspecified chronic kidney disease: Secondary | ICD-10-CM | POA: Diagnosis present

## 2022-09-09 DIAGNOSIS — E871 Hypo-osmolality and hyponatremia: Secondary | ICD-10-CM | POA: Diagnosis not present

## 2022-09-09 DIAGNOSIS — E872 Acidosis, unspecified: Secondary | ICD-10-CM | POA: Diagnosis present

## 2022-09-09 DIAGNOSIS — I674 Hypertensive encephalopathy: Secondary | ICD-10-CM | POA: Diagnosis present

## 2022-09-09 DIAGNOSIS — J9601 Acute respiratory failure with hypoxia: Secondary | ICD-10-CM

## 2022-09-09 DIAGNOSIS — E1165 Type 2 diabetes mellitus with hyperglycemia: Secondary | ICD-10-CM | POA: Diagnosis present

## 2022-09-09 DIAGNOSIS — R410 Disorientation, unspecified: Secondary | ICD-10-CM | POA: Insufficient documentation

## 2022-09-09 DIAGNOSIS — Z4682 Encounter for fitting and adjustment of non-vascular catheter: Secondary | ICD-10-CM | POA: Diagnosis not present

## 2022-09-09 DIAGNOSIS — R569 Unspecified convulsions: Secondary | ICD-10-CM

## 2022-09-09 DIAGNOSIS — E1142 Type 2 diabetes mellitus with diabetic polyneuropathy: Secondary | ICD-10-CM | POA: Diagnosis present

## 2022-09-09 DIAGNOSIS — R4182 Altered mental status, unspecified: Secondary | ICD-10-CM | POA: Diagnosis not present

## 2022-09-09 DIAGNOSIS — M159 Polyosteoarthritis, unspecified: Secondary | ICD-10-CM | POA: Diagnosis present

## 2022-09-09 DIAGNOSIS — E039 Hypothyroidism, unspecified: Secondary | ICD-10-CM | POA: Diagnosis present

## 2022-09-09 DIAGNOSIS — F1021 Alcohol dependence, in remission: Secondary | ICD-10-CM | POA: Diagnosis present

## 2022-09-09 DIAGNOSIS — F1123 Opioid dependence with withdrawal: Secondary | ICD-10-CM | POA: Diagnosis present

## 2022-09-09 DIAGNOSIS — Z981 Arthrodesis status: Secondary | ICD-10-CM | POA: Diagnosis not present

## 2022-09-09 DIAGNOSIS — I5032 Chronic diastolic (congestive) heart failure: Secondary | ICD-10-CM | POA: Diagnosis not present

## 2022-09-09 DIAGNOSIS — E669 Obesity, unspecified: Secondary | ICD-10-CM | POA: Diagnosis present

## 2022-09-09 DIAGNOSIS — I161 Hypertensive emergency: Secondary | ICD-10-CM | POA: Diagnosis not present

## 2022-09-09 DIAGNOSIS — J449 Chronic obstructive pulmonary disease, unspecified: Secondary | ICD-10-CM | POA: Diagnosis not present

## 2022-09-09 DIAGNOSIS — J9691 Respiratory failure, unspecified with hypoxia: Secondary | ICD-10-CM | POA: Diagnosis present

## 2022-09-09 LAB — C-REACTIVE PROTEIN: CRP: 1.4 mg/dL — ABNORMAL HIGH (ref <1.0)

## 2022-09-09 LAB — I-STAT ARTERIAL BLOOD GAS, ED
Acid-base deficit: 4 mmol/L — ABNORMAL HIGH (ref 0.0–2.0)
Bicarbonate: 23.6 mmol/L (ref 20.0–28.0)
Calcium, Ion: 1.23 mmol/L (ref 1.15–1.40)
HCT: 56 % — ABNORMAL HIGH (ref 36.0–46.0)
Hemoglobin: 19 g/dL — ABNORMAL HIGH (ref 12.0–15.0)
O2 Saturation: 100 %
Potassium: 3.7 mmol/L (ref 3.5–5.1)
Sodium: 142 mmol/L (ref 135–145)
TCO2: 25 mmol/L (ref 22–32)
pCO2 arterial: 52.5 mmHg — ABNORMAL HIGH (ref 32–48)
pH, Arterial: 7.26 — ABNORMAL LOW (ref 7.35–7.45)
pO2, Arterial: 435 mmHg — ABNORMAL HIGH (ref 83–108)

## 2022-09-09 LAB — VITAMIN B12: Vitamin B-12: 845 pg/mL (ref 180–914)

## 2022-09-09 LAB — HEMOGLOBIN A1C
Hgb A1c MFr Bld: 5.9 % — ABNORMAL HIGH (ref 4.8–5.6)
Mean Plasma Glucose: 123 mg/dL

## 2022-09-09 LAB — BASIC METABOLIC PANEL
Anion gap: 20 — ABNORMAL HIGH (ref 5–15)
BUN: 20 mg/dL (ref 6–20)
CO2: 16 mmol/L — ABNORMAL LOW (ref 22–32)
Calcium: 9 mg/dL (ref 8.9–10.3)
Chloride: 102 mmol/L (ref 98–111)
Creatinine, Ser: 1.23 mg/dL — ABNORMAL HIGH (ref 0.44–1.00)
GFR, Estimated: 51 mL/min — ABNORMAL LOW (ref >60)
Glucose, Bld: 112 mg/dL — ABNORMAL HIGH (ref 70–99)
Potassium: 3.9 mmol/L (ref 3.5–5.1)
Sodium: 138 mmol/L (ref 135–145)

## 2022-09-09 LAB — RPR: RPR Ser Ql: NONREACTIVE

## 2022-09-09 LAB — CBC
HCT: 57.1 % — ABNORMAL HIGH (ref 36.0–46.0)
Hemoglobin: 19.4 g/dL — ABNORMAL HIGH (ref 12.0–15.0)
MCH: 26.6 pg (ref 26.0–34.0)
MCHC: 34 g/dL (ref 30.0–36.0)
MCV: 78.4 fL — ABNORMAL LOW (ref 80.0–100.0)
Platelets: 239 10*3/uL (ref 150–400)
RBC: 7.28 MIL/uL — ABNORMAL HIGH (ref 3.87–5.11)
RDW: 22.6 % — ABNORMAL HIGH (ref 11.5–15.5)
WBC: 11.1 10*3/uL — ABNORMAL HIGH (ref 4.0–10.5)
nRBC: 0 % (ref 0.0–0.2)

## 2022-09-09 LAB — PROCALCITONIN: Procalcitonin: <0.1 ng/mL

## 2022-09-09 LAB — GLUCOSE, CAPILLARY
Glucose-Capillary: 104 mg/dL — ABNORMAL HIGH (ref 70–99)
Glucose-Capillary: 121 mg/dL — ABNORMAL HIGH (ref 70–99)
Glucose-Capillary: 128 mg/dL — ABNORMAL HIGH (ref 70–99)
Glucose-Capillary: 142 mg/dL — ABNORMAL HIGH (ref 70–99)

## 2022-09-09 LAB — FOLATE: Folate: 18.8 ng/mL (ref >5.9)

## 2022-09-09 LAB — PHOSPHORUS: Phosphorus: 4.2 mg/dL (ref 2.5–4.6)

## 2022-09-09 LAB — TSH: TSH: 0.274 u[IU]/mL — ABNORMAL LOW (ref 0.350–4.500)

## 2022-09-09 LAB — MAGNESIUM
Magnesium: 1.7 mg/dL (ref 1.7–2.4)
Magnesium: 1.8 mg/dL (ref 1.7–2.4)

## 2022-09-09 LAB — CBG MONITORING, ED
Glucose-Capillary: 178 mg/dL — ABNORMAL HIGH (ref 70–99)
Glucose-Capillary: 118 mg/dL — ABNORMAL HIGH (ref 70–99)

## 2022-09-09 LAB — T4, FREE: Free T4: 1.16 ng/dL — ABNORMAL HIGH (ref 0.61–1.12)

## 2022-09-09 LAB — HIV ANTIBODY (ROUTINE TESTING W REFLEX): HIV Screen 4th Generation wRfx: NONREACTIVE

## 2022-09-09 LAB — TROPONIN I (HIGH SENSITIVITY): Troponin I (High Sensitivity): 3 ng/L (ref <18)

## 2022-09-09 LAB — ACETAMINOPHEN LEVEL: Acetaminophen (Tylenol), Serum: <10 ug/mL — ABNORMAL LOW (ref 10–30)

## 2022-09-09 LAB — SALICYLATE LEVEL: Salicylate Lvl: <7 mg/dL — ABNORMAL LOW (ref 7.0–30.0)

## 2022-09-09 LAB — AMMONIA: Ammonia: 22 umol/L (ref 9–35)

## 2022-09-09 LAB — MRSA NEXT GEN BY PCR, NASAL: MRSA by PCR Next Gen: DETECTED — AB

## 2022-09-09 MED ORDER — ACETAMINOPHEN 325 MG PO TABS: 650.0000 mg | ORAL_TABLET | Freq: Four times a day (QID) | ORAL | Status: DC | PRN

## 2022-09-09 MED ORDER — ATORVASTATIN CALCIUM 40 MG PO TABS
40.0000 mg | ORAL_TABLET | Freq: Every day | ORAL | Status: DC
  Administered 2022-09-09 – 2022-09-11 (×3): 40 mg
  Filled 2022-09-09 (×3): qty 1

## 2022-09-09 MED ORDER — VITAMIN B-6 100 MG PO TABS
100.0000 mg | ORAL_TABLET | Freq: Every day | ORAL | Status: DC
  Administered 2022-09-09 – 2022-09-11 (×3): 100 mg
  Filled 2022-09-09 (×4): qty 1

## 2022-09-09 MED ORDER — PROPOFOL 1000 MG/100ML IV EMUL
0.0000 ug/kg/min | INTRAVENOUS | Status: DC
  Administered 2022-09-09 – 2022-09-10 (×8): 50 ug/kg/min via INTRAVENOUS
  Administered 2022-09-10: 40 ug/kg/min via INTRAVENOUS
  Administered 2022-09-10 – 2022-09-11 (×4): 50 ug/kg/min via INTRAVENOUS
  Filled 2022-09-09 (×3): qty 100
  Filled 2022-09-09: qty 200
  Filled 2022-09-09: qty 2000
  Filled 2022-09-09 (×9): qty 100

## 2022-09-09 MED ORDER — LABETALOL HCL 5 MG/ML IV SOLN
5.0000 mg | Freq: Once | INTRAVENOUS | Status: AC
  Administered 2022-09-09: 5 mg via INTRAVENOUS
  Filled 2022-09-09: qty 4

## 2022-09-09 MED ORDER — MIDAZOLAM HCL 2 MG/2ML IJ SOLN
INTRAMUSCULAR | Status: AC
  Filled 2022-09-09: qty 2

## 2022-09-09 MED ORDER — MUPIROCIN 2 % EX OINT
1.0000 | TOPICAL_OINTMENT | Freq: Two times a day (BID) | CUTANEOUS | Status: DC
  Administered 2022-09-09 – 2022-09-13 (×8): 1 via NASAL
  Filled 2022-09-09 (×4): qty 22

## 2022-09-09 MED ORDER — LORAZEPAM 2 MG/ML IJ SOLN
INTRAMUSCULAR | Status: AC
  Filled 2022-09-09: qty 1

## 2022-09-09 MED ORDER — FAMOTIDINE 20 MG PO TABS
20.0000 mg | ORAL_TABLET | Freq: Every day | ORAL | Status: DC
  Administered 2022-09-09 – 2022-09-11 (×3): 20 mg
  Filled 2022-09-09 (×3): qty 1

## 2022-09-09 MED ORDER — REVEFENACIN 175 MCG/3ML IN SOLN
175.0000 ug | Freq: Every day | RESPIRATORY_TRACT | Status: DC
  Administered 2022-09-09 – 2022-09-13 (×5): 175 ug via RESPIRATORY_TRACT
  Filled 2022-09-09 (×5): qty 3

## 2022-09-09 MED ORDER — ADULT MULTIVITAMIN W/MINERALS CH
1.0000 | ORAL_TABLET | Freq: Every day | ORAL | Status: DC
  Administered 2022-09-09 – 2022-09-13 (×5): 1
  Filled 2022-09-09 (×5): qty 1

## 2022-09-09 MED ORDER — DOCUSATE SODIUM 50 MG/5ML PO LIQD
100.0000 mg | Freq: Two times a day (BID) | ORAL | Status: DC
  Administered 2022-09-09 – 2022-09-10 (×3): 100 mg
  Filled 2022-09-09 (×5): qty 10

## 2022-09-09 MED ORDER — AMIODARONE HCL IN DEXTROSE 360-4.14 MG/200ML-% IV SOLN
60.0000 mg/h | INTRAVENOUS | Status: DC
  Administered 2022-09-09: 60 mg/h via INTRAVENOUS

## 2022-09-09 MED ORDER — POLYETHYLENE GLYCOL 3350 17 G PO PACK: 17.0000 g | PACK | Freq: Every day | ORAL | Status: DC | PRN

## 2022-09-09 MED ORDER — ROCURONIUM BROMIDE 10 MG/ML (PF) SYRINGE
PREFILLED_SYRINGE | INTRAVENOUS | Status: AC
  Administered 2022-09-09: 100 mg via INTRAVENOUS
  Filled 2022-09-09: qty 10

## 2022-09-09 MED ORDER — ROCURONIUM BROMIDE 10 MG/ML (PF) SYRINGE: 100.0000 mg | PREFILLED_SYRINGE | Freq: Once | INTRAVENOUS | Status: AC

## 2022-09-09 MED ORDER — LEVETIRACETAM IN NACL 1000 MG/100ML IV SOLN
1000.0000 mg | INTRAVENOUS | Status: AC
  Administered 2022-09-09 (×2): 1000 mg via INTRAVENOUS

## 2022-09-09 MED ORDER — LACTATED RINGERS IV SOLN: INTRAVENOUS | Status: DC

## 2022-09-09 MED ORDER — PROPOFOL 10 MG/ML IV BOLUS
50.0000 mg | Freq: Once | INTRAVENOUS | Status: AC
  Administered 2022-09-09: 50 mg via INTRAVENOUS

## 2022-09-09 MED ORDER — ORAL CARE MOUTH RINSE
15.0000 mL | OROMUCOSAL | Status: DC
  Administered 2022-09-09 – 2022-09-11 (×28): 15 mL via OROMUCOSAL

## 2022-09-09 MED ORDER — ACETAMINOPHEN 650 MG RE SUPP: 650.0000 mg | Freq: Four times a day (QID) | RECTAL | Status: DC | PRN

## 2022-09-09 MED ORDER — ACETAMINOPHEN 325 MG PO TABS: 650.0000 mg | ORAL_TABLET | ORAL | Status: DC | PRN

## 2022-09-09 MED ORDER — ETOMIDATE 2 MG/ML IV SOLN: 20.0000 mg | Freq: Once | INTRAVENOUS | Status: AC

## 2022-09-09 MED ORDER — LEVETIRACETAM IN NACL 1000 MG/100ML IV SOLN
INTRAVENOUS | Status: AC
  Administered 2022-09-09: 1000 mg via INTRAVENOUS
  Filled 2022-09-09: qty 300

## 2022-09-09 MED ORDER — SODIUM CHLORIDE 0.9 % IV BOLUS
1000.0000 mL | Freq: Once | INTRAVENOUS | Status: AC
  Administered 2022-09-09: 1000 mL via INTRAVENOUS

## 2022-09-09 MED ORDER — AMIODARONE HCL IN DEXTROSE 360-4.14 MG/200ML-% IV SOLN
60.0000 mg/h | INTRAVENOUS | Status: DC
  Filled 2022-09-09: qty 200

## 2022-09-09 MED ORDER — CHLORHEXIDINE GLUCONATE CLOTH 2 % EX PADS
6.0000 | MEDICATED_PAD | Freq: Every day | CUTANEOUS | Status: DC
  Administered 2022-09-09 – 2022-09-13 (×5): 6 via TOPICAL

## 2022-09-09 MED ORDER — AMIODARONE HCL IN DEXTROSE 360-4.14 MG/200ML-% IV SOLN: 30.0000 mg/h | INTRAVENOUS | Status: DC

## 2022-09-09 MED ORDER — LORAZEPAM 2 MG/ML IJ SOLN: 2.0000 mg | Freq: Once | INTRAMUSCULAR | Status: DC

## 2022-09-09 MED ORDER — THIAMINE HCL 100 MG/ML IJ SOLN
100.0000 mg | Freq: Every day | INTRAMUSCULAR | Status: DC
  Administered 2022-09-09 – 2022-09-12 (×4): 100 mg via INTRAVENOUS
  Filled 2022-09-09 (×4): qty 2

## 2022-09-09 MED ORDER — FOLIC ACID 5 MG/ML IJ SOLN
1.0000 mg | Freq: Every day | INTRAMUSCULAR | Status: DC
  Administered 2022-09-09 – 2022-09-10 (×2): 1 mg via INTRAVENOUS
  Filled 2022-09-09 (×3): qty 0.2

## 2022-09-09 MED ORDER — ORAL CARE MOUTH RINSE: 15.0000 mL | OROMUCOSAL | Status: DC | PRN

## 2022-09-09 MED ORDER — LORAZEPAM 2 MG/ML IJ SOLN
0.5000 mg | Freq: Once | INTRAMUSCULAR | Status: DC | PRN
  Filled 2022-09-09: qty 1

## 2022-09-09 MED ORDER — SODIUM CHLORIDE 0.9 % IV SOLN: 3000.0000 mg | Freq: Once | INTRAVENOUS | Status: DC

## 2022-09-09 MED ORDER — VITAL AF 1.2 CAL PO LIQD
1000.0000 mL | ORAL | Status: DC
  Administered 2022-09-09 – 2022-09-11 (×3): 1000 mL
  Filled 2022-09-09: qty 1000

## 2022-09-09 MED ORDER — FENTANYL CITRATE PF 50 MCG/ML IJ SOSY: 50.0000 ug | PREFILLED_SYRINGE | INTRAMUSCULAR | Status: DC | PRN

## 2022-09-09 MED ORDER — HEPARIN SODIUM (PORCINE) 5000 UNIT/ML IJ SOLN
5000.0000 [IU] | Freq: Three times a day (TID) | INTRAMUSCULAR | Status: DC
  Administered 2022-09-09 – 2022-09-13 (×13): 5000 [IU] via SUBCUTANEOUS
  Filled 2022-09-09 (×13): qty 1

## 2022-09-09 MED ORDER — PROPOFOL 1000 MG/100ML IV EMUL
5.0000 ug/kg/min | INTRAVENOUS | Status: DC
  Administered 2022-09-09: 5 ug/kg/min via INTRAVENOUS
  Filled 2022-09-09: qty 100

## 2022-09-09 MED ORDER — INSULIN ASPART 100 UNIT/ML IJ SOLN
0.0000 [IU] | INTRAMUSCULAR | Status: DC
  Administered 2022-09-09: 2 [IU] via SUBCUTANEOUS
  Administered 2022-09-09 – 2022-09-11 (×11): 1 [IU] via SUBCUTANEOUS
  Administered 2022-09-11: 2 [IU] via SUBCUTANEOUS
  Administered 2022-09-12: 1 [IU] via SUBCUTANEOUS
  Administered 2022-09-12 (×2): 2 [IU] via SUBCUTANEOUS
  Administered 2022-09-12 – 2022-09-13 (×4): 1 [IU] via SUBCUTANEOUS

## 2022-09-09 MED ORDER — HYDRALAZINE HCL 20 MG/ML IJ SOLN: 10.0000 mg | Freq: Once | INTRAMUSCULAR | Status: DC

## 2022-09-09 MED ORDER — ETOMIDATE 2 MG/ML IV SOLN
INTRAVENOUS | Status: AC
  Administered 2022-09-09: 20 mg via INTRAVENOUS
  Filled 2022-09-09: qty 10

## 2022-09-09 MED ORDER — AMIODARONE HCL IN DEXTROSE 360-4.14 MG/200ML-% IV SOLN
30.0000 mg/h | INTRAVENOUS | Status: DC
  Administered 2022-09-10 – 2022-09-11 (×3): 30 mg/h via INTRAVENOUS
  Filled 2022-09-09 (×3): qty 200

## 2022-09-09 MED ORDER — IPRATROPIUM-ALBUTEROL 0.5-2.5 (3) MG/3ML IN SOLN
3.0000 mL | RESPIRATORY_TRACT | Status: DC | PRN
  Administered 2022-09-11: 3 mL via RESPIRATORY_TRACT
  Filled 2022-09-09: qty 3

## 2022-09-09 MED ORDER — PROSOURCE TF20 ENFIT COMPATIBL EN LIQD
60.0000 mL | Freq: Every day | ENTERAL | Status: DC
  Administered 2022-09-09 – 2022-09-13 (×5): 60 mL
  Filled 2022-09-09 (×5): qty 60

## 2022-09-09 MED ORDER — CLEVIDIPINE BUTYRATE 0.5 MG/ML IV EMUL
0.0000 mg/h | INTRAVENOUS | Status: DC
  Administered 2022-09-09 (×2): 5 mg/h via INTRAVENOUS
  Administered 2022-09-09: 2 mg/h via INTRAVENOUS
  Administered 2022-09-09: 10 mg/h via INTRAVENOUS
  Administered 2022-09-10: 5 mg/h via INTRAVENOUS
  Filled 2022-09-09: qty 50
  Filled 2022-09-09: qty 100
  Filled 2022-09-09 (×3): qty 50

## 2022-09-09 MED ORDER — POLYETHYLENE GLYCOL 3350 17 G PO PACK
17.0000 g | PACK | Freq: Every day | ORAL | Status: DC
  Administered 2022-09-09 – 2022-09-10 (×2): 17 g
  Filled 2022-09-09 (×3): qty 1

## 2022-09-09 MED ORDER — FENTANYL CITRATE PF 50 MCG/ML IJ SOSY
50.0000 ug | PREFILLED_SYRINGE | INTRAMUSCULAR | Status: DC | PRN
  Administered 2022-09-09 – 2022-09-10 (×5): 100 ug via INTRAVENOUS
  Filled 2022-09-09 (×6): qty 2

## 2022-09-09 MED ORDER — ENOXAPARIN SODIUM 40 MG/0.4ML IJ SOSY: 40.0000 mg | PREFILLED_SYRINGE | Freq: Every day | INTRAMUSCULAR | Status: DC

## 2022-09-09 MED ORDER — AMIODARONE LOAD VIA INFUSION
150.0000 mg | Freq: Once | INTRAVENOUS | Status: DC
  Filled 2022-09-09: qty 83.34

## 2022-09-09 MED ORDER — BUDESONIDE 0.5 MG/2ML IN SUSP
0.5000 mg | Freq: Two times a day (BID) | RESPIRATORY_TRACT | Status: DC
  Administered 2022-09-09 – 2022-09-13 (×10): 0.5 mg via RESPIRATORY_TRACT
  Filled 2022-09-09 (×10): qty 2

## 2022-09-09 MED ORDER — ARFORMOTEROL TARTRATE 15 MCG/2ML IN NEBU
15.0000 ug | INHALATION_SOLUTION | Freq: Two times a day (BID) | RESPIRATORY_TRACT | Status: DC
  Administered 2022-09-09 – 2022-09-13 (×10): 15 ug via RESPIRATORY_TRACT
  Filled 2022-09-09 (×10): qty 2

## 2022-09-09 MED ORDER — DOCUSATE SODIUM 50 MG/5ML PO LIQD: 100.0000 mg | Freq: Two times a day (BID) | ORAL | Status: DC | PRN

## 2022-09-09 NOTE — Consult Note (Addendum)
NEURO HOSPITALIST CONSULT NOTE   Requestig physician: Dr. Vassie Loll  Reason for Consult: Altered mental status followed by seizure  History obtained from:  Chart     HPI:                                                                                                                                          [name] is a 57 y.o. female with a PMHx of alcoholism in remission (as of 11/26/21 and family endorses only occasional EtOH socially a few times per year, polysubstance abuse (prior cocaine use but clear for the past few months, chronic opioid use, lumbar radiculopathy, CKD4, COPD, CHF, HTN, HLD, OSA and polyneuropathy who presents to the ED after she had a seizure. She was initially found by family on Sunday to be naked in her bed soaked in urine with delusional speech. She was last seen normal on Saturday at 9 AM. EMS was called and the patient was brought to the ED where she was lethargic on arrival, able to wake up but very confused. She was afebrile but had severe HTN at 189/121. CBG was 115. EtOH, UDS, CK, ammonia and acetaminophen/salicylate levels were all negative. CT head showed no acute abnormality. While still in the ED, the patient had a seizure which was treated with Ativan. She was intubated for airway protection. She was loaded with Keppra 3000 mg IV x 1. She has been started on thiamine. MRI brain shows no acute abnormality.    Past Medical History:  Diagnosis Date   Alcoholism in remission (HCC)    11-26-2021  pt stated no alcohol few yrs ago   Asthma    Bipolar disorder (HCC)    Chronic diastolic CHF (congestive heart failure) (HCC) 2014   followed by dr b. Cristal Deer;   Chronic hypoxemic respiratory failure [facility])    pulmonologist--- dr Tonia Brooms;   (11-26-2021 per pt was on supplemental oxygen until she 01/ 2022 did not need it,  does not check O2 sats at home anymore.   CKD (chronic kidney disease), stage III [facility])    nephrologist--- dr Ronalee Belts    COPD (chronic obstructive pulmonary disease) (HCC)    11-26-2021 pt trying to quit smoking ,  started age 56   Depression    Diabetes mellitus type 2, diet-controlled (HCC)    followed by pcp   (11-26-2021  per pt last A1c by pcp on 10-30-2021 7.8,  stated does not check blood sugar)   Dyspnea    Fibrocystic breast disease    GAD (generalized anxiety disorder)    GERD (gastroesophageal reflux disease)    Hepatitis B surface antigen positive 11/2017   pt stated asymptomatic ,  no treatment   History of cervical dysplasia 04/10/2011   '93- once dx.-got  pregnant-no intervention, then postpartum, no dysplasia found   History of condyloma acuminatum 04/09/2011   Hyperlipidemia    Hypertension    Incomplete left bundle branch block (LBBB)    Lung nodule 11/2017   pulmonologist--- dr Tonia Brooms;  RUL and RLL   Muscle spasm of both lower legs    chronic , due to back issues,   OA (osteoarthritis) 04/10/2011   hips, shoulders, back   OSA (obstructive sleep apnea) 2009   study on epic 09-18-2007 mild osa w/ hypoxmia ;   (11-26-2021 per pt last used cpap 01/ 2022 before bariatric surgery 04/ 2022 stated lost wt / and stopped needing supplemental oxygen , did not need anymore)   Polyneuropathy    due to back issues and DM   Polysubstance abuse (HCC)    11-26-2021  pt hx cocaine use stated none for yrs but ED visit 08-14-2021  positive UDS,  pt stated it was once only for depression none since   PSVT (paroxysmal supraventricular tachycardia)    followed by cardiology   Spinal stenosis, unspecified spinal region    Urge incontinence of urine    Wears dentures    upper    Past Surgical History:  Procedure Laterality Date   ANAL FISTULECTOMY  04/17/2011   Procedure: FISTULECTOMY ANAL;  Surgeon: Wilmon Arms. Corliss Skains, MD;  Location: [facility];  Service: General;  Laterality: N/A;  Excision of Condyloma Gluteal Cleft    ANAL RECTAL MANOMETRY N/A 03/26/2019   Procedure: ANO RECTAL MANOMETRY;  Surgeon:  Tressia Danas, MD;  Location: [facility];  Service: Gastroenterology;  Laterality: N/A;   BIOPSY  03/09/2019   Procedure: BIOPSY;  Surgeon: Tressia Danas, MD;  Location: [facility];  Service: Gastroenterology;;   BOTOX INJECTION N/A 12/29/2018   Procedure: BOTOX INJECTION(100 UNITS)  WITH CYSTOSCOPY;  Surgeon: Rene Paci, MD;  Location: [facility];  Service: Urology;  Laterality: N/A;   BOTOX INJECTION N/A 07/27/2019   Procedure: BOTOX INJECTION WITH CYSTOSCOPY;  Surgeon: Rene Paci, MD;  Location: [facility];  Service: Urology;  Laterality: N/A;  ONLY NEEDS 30 MIN   BOTOX INJECTION N/A 05/02/2021   Procedure: BOTOX INJECTION WITH CYSTOSCOPY;  Surgeon: Rene Paci, MD;  Location: [facility];  Service: Urology;  Laterality: N/A;  ONLY NEEDS 30 MIN   BOTOX INJECTION N/A 11/28/2021   Procedure: BOTOX INJECTION WITH CYSTOSCOPY, 200 UNITS;  Surgeon: Rene Paci, MD;  Location: [facility];  Service: Urology;  Laterality: N/A;  ONLY NEEDS 30 MIN   BOTOX INJECTION N/A 05/15/2022   Procedure: BOTOX INJECTION 200 UNITS;  Surgeon: Rene Paci, MD;  Location[facility];  Service: Urology;  Laterality: N/A;   BREAST EXCISIONAL BIOPSY Right    x 3   BREAST EXCISIONAL BIOPSY Left    12-29-2000  and 11-30-2001 @[facility]  by dr Carolynne Edouard   CARPAL TUNNEL RELEASE Bilateral    right 08-30-2002 and left 07-04-2003  by dr Otelia Sergeant @ [facility]   CATARACT EXTRACTION W/ INTRAOCULAR LENS IMPLANT Bilateral 12/2020   CERVICAL FUSION  04/10/2011   x3- cervical fusion with plating and screws-Dr. Noel Gerold   COLONOSCOPY WITH PROPOFOL N/A 07/06/2015   Procedure: COLONOSCOPY WITH PROPOFOL;  Surgeon: Dorena Cookey, MD;  [facility]: [facility];  Service: Endoscopy;  Laterality: N/A;   COLONOSCOPY WITH PROPOFOL N/A 03/09/2019   Procedure: COLONOSCOPY WITH PROPOFOL;  Surgeon: Tressia Danas, MD;  Location: [facility];  Service:  Gastroenterology;  Laterality: N/A;   CYSTOSCOPY  N/A 05/15/2022   Procedure: CYSTOSCOPY;  Surgeon: Rene Paci, MD;  Location: [facility];  Service: Urology;  Laterality: N/A;   FRACTURE SURGERY     little toe left foot   I & D EXTREMITY Left 12/18/2016   Procedure: IRRIGATION AND DEBRIDEMENT LEFT FOOT, CLOSURE;  Surgeon: Tarry Kos, MD;  Location: [facility];  Service: Orthopedics;  Laterality: Left;   INCISION AND DRAINAGE ABSCESS Right 05/03/2002   @[facility]  by dr Carolynne Edouard;   right breast abscess   INJECTION KNEE Right 10/03/2020   Procedure: KNEE INJECTION;  Surgeon: Kerrin Champagne, MD;  Location[facility] OR;  Service: Orthopedics;  Laterality: Right;   KNEE ARTHROSCOPY WITH MEDIAL MENISECTOMY Right 03/21/2021   Procedure: RIGHT KNEE ARTHROSCOPY WITH MEDIAL AND PARTIAL LATERAL MENISCECTOMY;  Surgeon: Tarry Kos, MD;  Location: [facility];  Service: Orthopedics;  Laterality: Right;   LAPAROSCOPIC GASTRIC SLEEVE RESECTION N/A 05/08/2020   Procedure: LAPAROSCOPIC GASTRIC SLEEVE RESECTION;  Surgeon: Sheliah Hatch De Blanch, MD;  Location: [facility];  Service: General;  Laterality: N/A;   LUMBAR DISC SURGERY  04/10/2011   x5-Lumbar fusion-retained hardware.(Dr. Otelia Sergeant)   LUMBAR LAMINECTOMY N/A 10/03/2020   Procedure: THORACOLUMBAR LAMINECTOMIES THORACIC ELEVEN-TWELVE, THORACIC TWELVE-LUMBAR ONE  AND LUMBAT ONE-TWO;  Surgeon: Kerrin Champagne, MD;  Location: [facility];  Service: Orthopedics;  Laterality: N/A;   LUMBAR PERCUTANEOUS PEDICLE SCREW 2 LEVEL  06/29/2021   Procedure: LUMBAR PERCUTANEOUS PEDICLE SCREW LUMBAR ONE-TWO;  Surgeon: Julio Sicks, MD;  Location: [facility];  Service: Neurosurgery;;   MULTIPLE TOOTH EXTRACTIONS     POLYPECTOMY  03/09/2019   Procedure: POLYPECTOMY;  Surgeon: Tressia Danas, MD;  Location: [facility];  Service: Gastroenterology;;   POSTERIOR LUMBAR FUSION  04/04/2005   @[facility]  by dr Otelia Sergeant;   L3-- L5   POSTERIOR LUMBAR FUSION  03/04/2006   @[facility]  dr  Otelia Sergeant;   fusion L2--3 and redo L3--5   SHOULDER ARTHROSCOPY WITH ROTATOR CUFF REPAIR AND SUBACROMIAL DECOMPRESSION Left 05/07/2019   Procedure: LEFT SHOULDER ARTHROSCOPY WITH DEBRIDEMENT, DISTAL CLAVICLE EXCISION,  SUBACROMIAL DECOMPRESSION AND POSSIBLE ROTATOR CUFF REPAIR;  Surgeon: Tarry Kos, MD;  Location: [facility];  Service: Orthopedics;  Laterality: Left;   TEE WITHOUT CARDIOVERSION N/A 04/06/2012   Procedure: TRANSESOPHAGEAL ECHOCARDIOGRAM (TEE);  Surgeon: Chrystie Nose, MD;  Location: [facility];  Service: Cardiovascular;  Laterality: N/A;   TRIGGER FINGER RELEASE Right    middle finger   TRIGGER FINGER RELEASE Left 06/28/2016   Procedure: RELEASE TRIGGER FINGER LEFT 3RD FINGER;  Surgeon: Tarry Kos, MD;  Location: [facility];  Service: Orthopedics;  Laterality: Left;   TRIGGER FINGER RELEASE Right 06/12/2016   Procedure: RIGHT INDEX FINGER TRIGGER RELEASE;  Surgeon: Tarry Kos, MD;  Location: [facility];  Service: Orthopedics;  Laterality: Right;   TRIGGER FINGER RELEASE Right 01/19/2018   Procedure: RIGHT RING FINGER TRIGGER FINGER RELEASE;  Surgeon: Tarry Kos, MD;  Location: [facility];  Service: Orthopedics;  Laterality: Right;   TRIGGER FINGER RELEASE Left 05/07/2019   Procedure: RELEASE TRIGGER FINGER LEFT INDEX FINGER;  Surgeon: Tarry Kos, MD;  Location: [facility];  Service: Orthopedics;  Laterality: Left;   UPPER GI ENDOSCOPY N/A 05/08/2020   Procedure: UPPER GI ENDOSCOPY;  Surgeon: Sheliah Hatch, De Blanch, MD;  Location: [facility];  Service: General;  Laterality: N/A;    Family History  Problem Relation Age of Onset   Pulmonary embolism Mother    Stroke Father    Alcohol abuse  Father    Pneumonia Father    Hypertension Father    Breast cancer Maternal Aunt    Hypertension Other    Diabetes Other        Aunts and cousins   Asthma Sister    Depression Sister    Arthritis Sister    Sickle cell anemia Son              Social History:  reports that she has been smoking  cigarettes. She started smoking about 45 years ago. She has a 22.8 pack-year smoking history. She has been exposed to tobacco smoke. She has never used smokeless tobacco. She reports that she does not currently use alcohol. She reports that she does not currently use drugs.  Allergies  Allergen Reactions   Aspirin Other (See Comments)    Kidney disease   Methadone Hcl Other (See Comments)     "blacked out" in 1990's   Levofloxacin Itching    MEDICATIONS:                                                                                                                     No current facility-administered medications on file prior to encounter.   Current Outpatient Medications on File Prior to Encounter  Medication Sig Dispense Refill   acetaminophen (TYLENOL) 500 MG tablet Take 1,000 mg by mouth 2 (two) times daily.     albuterol (VENTOLIN HFA) 108 (90 Base) MCG/ACT inhaler INHALE TWO puffs into THE lungs EVERY SIX HOURS AS NEEDED wheezind/ For SHORTNESS OF BREATH related TO acute respiratory failure WITH hypoxia 13.4 g 0   ammonium lactate (LAC-HYDRIN) 12 % lotion For dry skin (Patient taking differently: Apply 1 Application topically 2 (two) times daily. For dry skin) 400 g 0   atorvastatin (LIPITOR) 40 MG tablet TAKE ONE TABLET BY MOUTH EVERY DAY (Patient taking differently: Take 40 mg by mouth daily.) 180 tablet 0   baclofen (LIORESAL) 10 MG tablet Take 10 mg by mouth 3 (three) times daily.     Buprenorphine HCl 900 MCG FILM Place 1 Film inside cheek 2 (two) times daily.     busPIRone (BUSPAR) 10 MG tablet TAKE ONE TABLET BY MOUTH THREE TIMES DAILY (Patient taking differently: Take 10 mg by mouth 3 (three) times daily.) 540 tablet 0   calcium carbonate (OSCAL) 1500 (600 Ca) MG TABS tablet Take 1,500 mg by mouth 2 (two) times daily.     cloNIDine (CATAPRES) 0.1 MG tablet Take 1 tablet (0.1 mg total) by mouth 2 (two) times daily. 120 tablet 0   diclofenac Sodium (VOLTAREN) 1 % GEL APPLY TWO  grams topically FOUR TIMES DAILY 100 g 0   DILT-XR 120 MG 24 hr capsule TAKE ONE CAPSULE BY MOUTH TWICE DAILY related TO essential Primary hypertension 60 capsule 0   diltiazem (CARDIZEM CD) 120 MG 24 hr capsule Take 1 capsule (120 mg total) by mouth 2 (two) times daily. 60 capsule 5   fluticasone (FLONASE) 50 MCG/ACT nasal spray  INSTILL TWO SPRAYS INTO BOTH NOSTRILS TWICE DAILY (Patient taking differently: Place 2 sprays into both nostrils 2 (two) times daily. INSTILL TWO SPRAYS INTO BOTH NOSTRILS TWICE DAILY) 16 g 29   Fluticasone-Umeclidin-Vilant (TRELEGY ELLIPTA) 100-62.5-25 MCG/ACT AEPB Inhale 1 puff into the lungs daily. 28 each 1   furosemide (LASIX) 80 MG tablet TAKE ONE TABLET BY MOUTH TWICE DAILY related TO acute ON systolic (congestive heart failure) 60 tablet 0   gabapentin (NEURONTIN) 300 MG capsule Take 300 mg by mouth 2 (two) times daily.     gabapentin (NEURONTIN) 300 MG capsule Take 600 mg by mouth at bedtime.     JARDIANCE 10 MG TABS tablet give ONE tablet BY MOUTH EVERY DAY related TO type TWO diabetes mellitus WITH diabetic chronic kidney disease 30 tablet 0   linaclotide (LINZESS) 72 MCG capsule Take 72 mcg by mouth daily before breakfast.     loratadine (CLARITIN) 10 MG tablet TAKE ONE TABLET BY MOUTH EVERY DAY FOR allergies 30 tablet 0   losartan (COZAAR) 25 MG tablet Take 1 tablet (25 mg total) by mouth at bedtime. (Patient taking differently: Take 25 mg by mouth at bedtime.) 90 tablet 3   montelukast (SINGULAIR) 10 MG tablet TAKE 1 TABLET BY MOUTH EVERYDAY AT BEDTIME Strength: 10 mg (Patient taking differently: Take 10 mg by mouth in the morning.) 90 tablet 3   Multiple Vitamins-Minerals (BARIATRIC MULTIVITAMINS/IRON) CAPS Take 1 capsule by mouth 2 (two) times daily.     NARCAN 4 MG/0.1ML LIQD nasal spray kit Place 0.4 mg into the nose once as needed (accidental overdose).     nystatin (MYCOSTATIN) 100000 UNIT/ML suspension Take 5 mLs (500,000 Units total) by mouth 4 (four)  times daily. Swish and swallow. (Patient taking differently: Take 5 mLs by mouth 4 (four) times daily as needed (qid prn). Swish and swallow.) 60 mL 3   oxyCODONE (OXY IR/ROXICODONE) 5 MG immediate release tablet Take 1 tablet (5 mg total) by mouth every 4 (four) hours as needed (sever back pain). 20 tablet 0   spironolactone (ALDACTONE) 25 MG tablet give ONE-HALF TABLET BY MOUTH EVERY DAY FOR chronic diastolic (congestive heart failure) 15 tablet 0   venlafaxine XR (EFFEXOR-XR) 75 MG 24 hr capsule TAKE ONE CAPSULE BY MOUTH EVERY DAY related TO major depressive disorder 30 capsule 0    Scheduled:  arformoterol  15 mcg Nebulization BID   atorvastatin  40 mg Per Tube Daily   budesonide (PULMICORT) nebulizer solution  0.5 mg Nebulization BID   docusate  100 mg Per Tube BID   famotidine  20 mg Per Tube Daily   folic acid  1 mg Intravenous Daily   heparin  5,000 Units Subcutaneous Q8H   insulin aspart  0-9 Units Subcutaneous Q4H   LORazepam       LORazepam  2 mg Intravenous Once   midazolam       multivitamin with minerals  1 tablet Per Tube Daily   polyethylene glycol  17 g Per Tube Daily   revefenacin  175 mcg Nebulization Daily   thiamine (VITAMIN B1) injection  100 mg Intravenous Daily   Continuous:  clevidipine 2 mg/hr (09/09/22 0523)   propofol (DIPRIVAN) infusion 30 mcg/kg/min (09/09/22 0559)    ROS:  History obtained from unobtainable from patient due to mental status  General ROS: negative for - chills, fatigue, fever, night sweats, weight gain or weight loss Psychological ROS: negative for - behavioral disorder, hallucinations, memory difficulties, mood swings or suicidal ideation Ophthalmic ROS: negative for - blurry vision, double vision, eye pain or loss of vision ENT ROS: negative for - epistaxis, nasal discharge, oral lesions, sore throat,  tinnitus or vertigo Allergy and Immunology ROS: negative for - hives or itchy/watery eyes Hematological and Lymphatic ROS: negative for - bleeding problems, bruising or swollen lymph nodes Endocrine ROS: negative for - galactorrhea, hair pattern changes, polydipsia/polyuria or temperature intolerance Respiratory ROS: negative for - cough, hemoptysis, shortness of breath or wheezing Cardiovascular ROS: negative for - chest pain, dyspnea on exertion, edema or irregular heartbeat Gastrointestinal ROS: negative for - abdominal pain, diarrhea, hematemesis, nausea/vomiting or stool incontinence Genito-Urinary ROS: negative for - dysuria, hematuria, incontinence or urinary frequency/urgency Musculoskeletal ROS: negative for - joint swelling or muscular weakness Neurological ROS: as noted in HPI Dermatological ROS: negative for rash and skin lesion changes   Blood pressure (!) 156/90, pulse (!) 116, temperature 98.4 F (36.9 C), temperature source Axillary, resp. rate 11, height 5\' 9"  (1.753 m), weight 105.1 kg, last menstrual period 01/29/1992, SpO2 100%.   General Examination:                                                                                                       Physical Exam  HEENT-  [place]/AT endotracheal tube in place Lungs- Respirations synchronous with ventilator Extremities- No edema  Neurological Examination (Patient is sedated with propofol and has just received bolus for agitation and dyssynchrony with ventilator) patient does not respond to name and is unable to follow commands.  She will grimace to sternal rub.  Pupils equal round and reactive to light, positive oculocephalic reflex, positive cough and gag.  Does not respond to peripheral noxious stimuli.    Lab Results: Basic Metabolic Panel: Recent Labs  Lab 09/08/22 2206 09/08/22 2256 09/09/22 0242 09/09/22 0507 09/09/22 0518  NA 141 140 138 142  --   K 4.8 5.9* 3.9 3.7  --   CL 102  --  102  --   --   CO2  21*  --  16*  --   --   GLUCOSE 115*  --  112*  --   --   BUN 19  --  20  --   --   CREATININE 1.58*  --  1.23*  --   --   CALCIUM 10.1  --  9.0  --   --   MG  --   --   --   --  1.7    CBC: Recent Labs  Lab 09/08/22 2206 09/08/22 2256 09/09/22 0230 09/09/22 0507  WBC 12.6*  --  11.1*  --   HGB 19.9* 20.4* 19.4* 19.0*  HCT 57.8* 60.0* 57.1* 56.0*  MCV 76.1*  --  78.4*  --   PLT 245  --  239  --     Cardiac  Enzymes: Recent Labs  Lab 09/08/22 2257  CKTOTAL 76    Lipid Panel: No results for input(s): "CHOL", "TRIG", "HDL", "CHOLHDL", "VLDL", "LDLCALC" in the last 168 hours.  Imaging: DG Abd Portable 1V  Result Date: 09/09/2022 CLINICAL DATA:  [id number].  Check orogastric tube placement. EXAM: PORTABLE ABDOMEN - 1 VIEW COMPARISON:  Abdomen series 05/31/2006 FINDINGS: Single AP view including only a portion of the right hemiabdomen at 3:48 a.m. NGT is visible in the superior aspect of the frame with the tip either in the most distal gastric antrum or in the bulb of the duodenum. The visualized portion abdomen demonstrates no dilated bowel. There is extensive lumbar fusion hardware. There are right lower quadrant calcifications which are probably injection granulomas. No right nephrolithiasis is seen. IMPRESSION: The tip of the NGT is either in the most distal gastric antrum or in the bulb of the duodenum. Electronically Signed   By: Almira Bar M.D.   On: 09/09/2022 04:23   DG Chest Portable 1 View  Result Date: 09/09/2022 CLINICAL DATA:  Status post intubation EXAM: PORTABLE CHEST 1 VIEW COMPARISON:  09/08/2022 FINDINGS: Cardiac shadow is stable. Endotracheal tube and gastric catheter are seen in satisfactory position. No focal infiltrate is seen. No bony abnormality is noted. Postsurgical changes in the cervical spine are noted. IMPRESSION: Tubes and lines in satisfactory position. No focal infiltrate is seen. Electronically Signed   By: Alcide Clever M.D.   On: 09/09/2022 04:02    CT HEAD WO CONTRAST ( )  Result Date: 09/08/2022 CLINICAL DATA:  Altered mental status EXAM: CT HEAD WITHOUT CONTRAST TECHNIQUE: Contiguous axial images were obtained from the base of the skull through the vertex without intravenous contrast. RADIATION DOSE REDUCTION: This exam was performed according to the departmental dose-optimization program which includes automated exposure control, adjustment of the mA and/or kV according to patient size and/or use of iterative reconstruction technique. COMPARISON:  08/13/2021 FINDINGS: Brain: There is no mass, hemorrhage or extra-axial collection. The size and configuration of the ventricles and extra-axial CSF spaces are normal. There is hypoattenuation of the white matter, most commonly indicating chronic small vessel disease. Vascular: No abnormal hyperdensity of the major intracranial arteries or dural venous sinuses. No intracranial atherosclerosis. Skull: The visualized skull base, calvarium and extracranial soft tissues are normal. Sinuses/Orbits: No fluid levels or advanced mucosal thickening of the visualized paranasal sinuses. No mastoid or middle ear effusion. The orbits are normal. IMPRESSION: 1. No acute intracranial abnormality. 2. Chronic small vessel disease. Electronically Signed   By: Deatra Robinson M.D.   On: 09/08/2022 23:28   DG Chest Port 1 View  Result Date: 09/08/2022 CLINICAL DATA:  Altered mental status EXAM: PORTABLE CHEST 1 VIEW COMPARISON:  07/13/2022 FINDINGS: Cardiac shadow is enlarged but stable. Postsurgical changes are noted. Lungs are well aerated bilaterally. No focal infiltrate or effusion is seen. No bony abnormality is noted. IMPRESSION: Stable cardiomegaly.  No acute abnormality noted. Electronically Signed   By: Alcide Clever M.D.   On: 09/08/2022 22:55     Assessment: 57 y.o. female with a PMHx of alcoholism in remission (as of 11/26/21 and family endorses only occasional EtOH socially a few times per year, polysubstance  abuse (prior cocaine use but clear for the past few months, chronic opioid use, lumbar radiculopathy, CKD4, COPD, CHF, HTN, HLD, OSA and polyneuropathy who presents to the ED after she had a seizure. She was initially found by family on Sunday to be naked in her bed soaked in urine  with delusional speech. She was last seen normal on Saturday at 9 AM. EMS was called and the patient was brought to the ED where she was lethargic on arrival, able to wake up but very confused. She was afebrile but had severe HTN at 189/121. CBG was 115. EtOH, UDS, CK, ammonia and acetaminophen/salicylate levels were all negative. CT head showed no acute abnormality. While still in the ED, the patient had a seizure which was treated with Ativan. She was intubated for airway protection. She was loaded with Keppra 3000 mg IV x 1. She has been started on thiamine. MRI brain demonstrates no acute abnormality.  Suspect that seizure was provoked by hypertensive emergency. -Patient noted to be sedated and intubated with intact brainstem reflexes on exam, per RN was not following commands and was agitated prior to increased sedation - CT head:  No acute intracranial abnormality. Chronic small vessel disease. -MRI brain shows no acute abnormality  Recommendatons: -Long-term EEG -MRI negative for acute abnormality -No need for AEDs at this point as it appears seizure was provoked by hypertensive emergency but with no e/o PRES on MRI, but will add AEDs if further seizures seen on long-term EEG -Continue to main control of blood pressure with Cleviprex, goal SBP 150-160 - Will continue to follow  Patient seen by NP then by MD, MD to edit note is needed. Cortney E Ernestina Columbia , MSN, AGACNP-BC [facility] See Amion for schedule and pager information 09/09/2022 7:54 AM   Attending Neurohospitalist Addendum Patient seen and examined with APP/Resident. Agree with the history and physical as documented above. Agree with the  plan as documented, which I helped formulate. I have edited the note above to reflect my full findings and recommendations. I have independently reviewed the chart, obtained history, review of systems and examined the patient.I have personally reviewed pertinent head/neck/spine imaging (CT/MRI). Please feel free to call with any questions.  First 30 min of LTM EEG personally reviewed - no electrographic seizures. Continue LTM.  This patient is critically ill and at significant risk of neurological worsening, death and care requires constant monitoring of vital signs, hemodynamics,respiratory and cardiac monitoring, neurological assessment, discussion with family, other specialists and medical decision making of high complexity. I spent 40 minutes of neurocritical care time  in the care of  this patient. This was time spent independent of any time provided by nurse practitioner or PA.  Bing Neighbors, MD [facility] [phone]  If 7pm- 7am, please page neurology on call as listed in AMION.

## 2022-09-09 NOTE — Assessment & Plan Note (Signed)
-  Labetalol x1 in the ED  - holding home clonidine 0.1 mg BID,  - diltiazem 120 mg BID  - losartan 25 mg at bedtime  - spironolactone 25 mg daily

## 2022-09-09 NOTE — Progress Notes (Signed)
Pt transported to MRI on vent w/o any complications.

## 2022-09-09 NOTE — Progress Notes (Signed)
Initial Nutrition Assessment  DOCUMENTATION CODES:   Non-severe (moderate) malnutrition in context of chronic illness (likely social-environmental component as well)  INTERVENTION:   Tube Feeding via Cortrak (gastric):  Vital AF 1.2 at 60 ml/hr Pro-Source TF20 60 mL daily TF provides 1808 kcals, 128 g of protein and 1166 mL of free water  Plan to check phosphorus (add on) today and Monitor magnesium, potassium, and phosphorus BID for at least 3 days, MD to replete as needed, as pt is at risk for refeeding syndrome.  Pt is at high risk for micronutrient deficiencies given hx of gastric sleeve, muscle wasting on exam, hx substance abuse, etc. Plan to check CRP and B6 for now, B12 and Folic Acid already checked and wdl  Continue MVI with Minerals, Thiamine, Folic Acid.  Add B6 100 mg daily  NUTRITION DIAGNOSIS:   Moderate Malnutrition related to chronic illness, social / environmental circumstances as evidenced by mild fat depletion, mild muscle depletion, moderate muscle depletion.   GOAL:   Patient will meet greater than or equal to 90% of their needs   MONITOR:   Vent status, TF tolerance, Labs, Weight trends  REASON FOR ASSESSMENT:   Ventilator    ASSESSMENT:   57 yo female admitted with AMS with +seizure while in ED and intubated for airway protection AKI on CKD. PMH includes DM, CKD 4, COPD, diastolic CHF, EtOH in remission, polysubstance abuse, gastric sleeve in 2022  Pt remains on vent support, LTM EEG  Noted pt with hx of gastric sleeve in 2022. Pt has experienced wt loss per weight encounters, some (if not all) expected; however, noted muscle wasting on exam.  OG tube in place but Cortrak being placed  Noted pt last seen by Outpatient RD in April 2023. Noted weight of 405.6 pounds in 2020. Current wt 231.7 pounds  Micronutrient Panel: Folate 09/09/22: 18.8 (wdl) B12 09/09/22: 845 (wdl)  EtOH and Urine Drug negative  Labs:potassium wdl, magnesium 1.7  (wdl), no phosphorus Meds: MVI, thiamine, folic acid, miralax, colace, ss novolog  NUTRITION - FOCUSED PHYSICAL EXAM:  Flowsheet Row Most Recent Value  Orbital Region Mild depletion  Upper Arm Region Mild depletion  Thoracic and Lumbar Region No depletion  Buccal Region Unable to assess  Temple Region Moderate depletion  Clavicle Bone Region Mild depletion  Clavicle and Acromion Bone Region Mild depletion  Scapular Bone Region Unable to assess  Dorsal Hand Moderate depletion  Patellar Region Moderate depletion  Anterior Thigh Region Moderate depletion  Posterior Calf Region Moderate depletion  Edema (RD Assessment) None       Diet Order:   Diet Order             Diet NPO time specified Except for: Other (See Comments)  Diet effective now                   EDUCATION NEEDS:   Not appropriate for education at this time  Skin:  Skin Assessment: Reviewed RN Assessment  Last BM:  PTA  Height:   Ht Readings from Last 1 Encounters:  09/08/22 5\' 9"  (1.753 m)    Weight:   Wt Readings from Last 1 Encounters:  09/08/22 105.1 kg    BMI:  Body mass index is 34.22 kg/m.  Estimated Nutritional Needs:   Kcal:  1800-2000 kcals  Protein:  100-130 g  Fluid:  >/= 1.8 L   Romelle Starcher MS, RDN, LDN, CNSC Registered Dietitian 3 Clinical Nutrition RD Pager and On-Call Pager Number  Located in [place]

## 2022-09-09 NOTE — Procedures (Signed)
Cortrak  Person Inserting Tube:  Mitchell, Makayla L, RD Tube Type:  Cortrak - 43 inches Tube Size:  10 Tube Location:  Left nare Secured by: Bridle Technique Used to Measure Tube Placement:  Marking at nare/corner of mouth Cortrak Secured At:  68 cm   Cortrak Tube Team Note:  Consult received to place a Cortrak feeding tube.   X-ray is required, abdominal x-ray has been ordered by the Cortrak team. Please confirm tube placement before using the Cortrak tube.   If the tube becomes dislodged please keep the tube and contact the Cortrak team at [email or web address] for replacement.  If after hours and replacement cannot be delayed, place a NG tube and confirm placement with an abdominal x-ray.    Makayla Mitchell RD, LDN Clinical Dietitian See AMi[facility] for contact information.    

## 2022-09-09 NOTE — Progress Notes (Signed)
Patient transported from MRI to 4N24 on the ventilator with no problems.

## 2022-09-09 NOTE — H&P (Addendum)
NAME:  [name], MRN:  [id number], DOB:  [birth year], LOS: 0 ADMISSION DATE:  09/08/2022, CONSULTATION DATE:  8/12 REFERRING MD:  Dr Elliot Gurney, CHIEF COMPLAINT:  seizure   History of Present Illness:  Patient is a 57 year old female with pertinent PMH OHS/OSA, T2DM, CKD 4, COPD, diastolic CHF, alcoholism in remission, polysubstance abuse, lumbar radiculopathy chronic opioid use, bipolar, depression, HTN, HLD presents to [facility] on 8/11 w/ seizure.  On 8/11, patient send in to check on mother found her naked lying in bed talking delusional.  Bed noted to be soaked in urine.  EMS called for AMS the patient brought to [facility] ED. LKN 8/10 9 am.   Per son patient only drinks alcohol socially a few times a year and has had a previous history of cocaine use but has been clean for the past few months. Patient recently started on 0.1 clonidine bid to prevent opioid withdrawal.   Upon arrival patient lethargic but able to wake up but very confused.  Afebrile and hypertensive 189/121. Cbg 115. Ethanol, acetaminophen/salicylate level, UDS, ck, ammonia WNL. TSH 0.274 and t4 pending. CT head no acute abnormality. Patient given labetalol for htn. Patient admitted to family medicine service. MRI ordered to rule out pres and home meds gabapentin/clonidine held. HIV, RPR, B12, folate pending. While in ED patient had seizure episode requiring ativan and intubation for airway protection. Loaded w/ keppra. PCCM and neuro consulted.   Pertinent  Medical History   Past Medical History:  Diagnosis Date   Alcoholism in remission (HCC)    11-26-2021  pt stated no alcohol few yrs ago   Asthma    Bipolar disorder (HCC)    Chronic diastolic CHF (congestive heart failure) (HCC) 2014   followed by dr b. Cristal Deer;   Chronic hypoxemic respiratory failure [facility])    pulmonologist--- dr Tonia Brooms;   (11-26-2021 per pt was on supplemental oxygen until she 01/ 2022 did not need it,  does not check O2 sats at home anymore.   CKD  (chronic kidney disease), stage III [facility])    nephrologist--- dr Ronalee Belts   COPD (chronic obstructive pulmonary disease) (HCC)    11-26-2021 pt trying to quit smoking ,  started age 43   Depression    Diabetes mellitus type 2, diet-controlled (HCC)    followed by pcp   (11-26-2021  per pt last A1c by pcp on 10-30-2021 7.8,  stated does not check blood sugar)   Dyspnea    Fibrocystic breast disease    GAD (generalized anxiety disorder)    GERD (gastroesophageal reflux disease)    Hepatitis B surface antigen positive 11/2017   pt stated asymptomatic ,  no treatment   History of cervical dysplasia 04/10/2011   '93- once dx.-got pregnant-no intervention, then postpartum, no dysplasia found   History of condyloma acuminatum 04/09/2011   Hyperlipidemia    Hypertension    Incomplete left bundle branch block (LBBB)    Lung nodule 11/2017   pulmonologist--- dr Tonia Brooms;  RUL and RLL   Muscle spasm of both lower legs    chronic , due to back issues,   OA (osteoarthritis) 04/10/2011   hips, shoulders, back   OSA (obstructive sleep apnea) 2009   study on epic 09-18-2007 mild osa w/ hypoxmia ;   (11-26-2021 per pt last used cpap 01/ 2022 before bariatric surgery 04/ 2022 stated lost wt / and stopped needing supplemental oxygen , did not need anymore)   Polyneuropathy    due to back issues  and DM   Polysubstance abuse (HCC)    11-26-2021  pt hx cocaine use stated none for yrs but ED visit 08-14-2021  positive UDS,  pt stated it was once only for depression none since   PSVT (paroxysmal supraventricular tachycardia)    followed by cardiology   Spinal stenosis, unspecified spinal region    Urge incontinence of urine    Wears dentures    upper     Significant Hospital Events: Including procedures, antibiotic start and stop dates in addition to other pertinent events   8/12 admitted w/ AMS; intially concerned PRES; patient had seizure episode and intubated  Interim History / Subjective:   Intubated on mech vent Sedate on prop 10 Sbp 190-200 while in room  Objective   Blood pressure (!) 185/97, pulse (!) 103, temperature 99.8 F (37.7 C), temperature source Rectal, resp. rate 20, height 5\' 9"  (1.753 m), weight 105.1 kg, last menstrual period 01/29/1992, SpO2 100%.    Vent Mode: PRVC FiO2 (%):  [100 %] 100 % Set Rate:  [16 bmp] 16 bmp Vt Set:  [480 mL-520 mL] 520 mL PEEP:  [5 cmH20] 5 cmH20   Intake/Output Summary (Last 24 hours) at 09/09/2022 0402 Last data filed at 09/09/2022 0236 Gross per 24 hour  Intake 2000 ml  Output --  Net 2000 ml   Filed Weights   09/08/22 2159  Weight: 105.1 kg    Examination: General: critically ill appearing on mech vent HEENT: MM pink/moist; ETT in place Neuro: sedated; PERRL CV: s1s2, RRR, no m/r/g PULM:  dim clear BS bilaterally; on mech vent PRVC GI: soft, bsx4 active  Extremities: warm/dry, no edema  Skin: no rashes or lesions    Resolved Hospital Problem list     Assessment & Plan:   Acute metabolic encephalopathy -concern for PRES given SBP 180-200s; hx of previous alcohol use although per son is clean (withdrawal?); possible opiate withdrawal given patient recently stopped taking buprenorphine and was on gabapentin/clonidine to help avoid withdrawal -ct head negative for acute abnormality; Ethanol, acetaminophen/salicylate level, UDS, ck, ammonia WNL Seizure Plan: -neuro consulted; appreciate recs -MRI -start on cleviprex for HTN and concern for possible PRES -EEG -seizure precautions -AEDs per neuro -frequent neuro checks -limit sedating meds  Acute respiratory failure: intubated for airway protection Hx OHS/OSA COPD Plan: -LTVV strategy with tidal volumes of 6-8 cc/kg ideal body weight -check ABG and adjust settings accordingly -Goal plateau pressures less than 30 and driving pressures less than 15 -Wean PEEP/FiO2 for SpO2 >92% -VAP bundle in place -Daily SAT and SBT -PAD protocol in place -wean  sedation for RASS goal 0 to -1 -brovana, pulmicort, yupelri; prn duoneb  HTN emergency Hx of diastolic CHF Hx of HTN/HLD Plan: -cleviprex as above -will hold home bp meds for now; consider resuming later today -resume statin -consider resuming home lasix  AKI on CKD 4 Plan: -given iv fluids -Trend BMP / urinary output -Replace electrolytes as indicated -Avoid nephrotoxic agents, ensure adequate renal perfusion  T2dm Plan: -A1c -ssi and cbg monitoring  Leukocytosis -likely reactive Plan: -trend wbc/fever curve -f/u UC  Low TSH Plan: -check t4  Hx of alcoholism: in remission since 2023; per son only drinks 1-2 times per year Hx of polysubstance abuse: cocaine but has been clear for 2 months -etoh and uds negative Plan: -uds and ethanol wnl -will start on thiamine, folic acid, mvi  -monitor for signs of withdrawal  lumbar radiculopathy w/ chronic opioid use -has recently stopped taking buprenorphine  and no longer being managed by pain clinic; on low dose oxy, gabapentin, clonidine Plan: -will hold home meds for now given ams but will likely need to resume as patient ams improves to avoid withdrawal  Bipolar Depression Plan: -hold home buspar and effexor  Best Practice (right click and "Reselect all SmartList Selections" daily)   Diet/type: NPO w/ meds via tube DVT prophylaxis: prophylactic heparin  GI prophylaxis: H2B Lines: N/A Foley:  N/A Code Status:  full code Last date of multidisciplinary goals of care discussion [8/12 updated son dominique over phone]  Labs   CBC: Recent Labs  Lab 09/08/22 2206 09/08/22 2256 09/09/22 0230  WBC 12.6*  --  11.1*  HGB 19.9* 20.4* 19.4*  HCT 57.8* 60.0* 57.1*  MCV 76.1*  --  78.4*  PLT 245  --  239    Basic Metabolic Panel: Recent Labs  Lab 09/08/22 2206 09/08/22 2256  NA 141 140  K 4.8 5.9*  CL 102  --   CO2 21*  --   GLUCOSE 115*  --   BUN 19  --   CREATININE 1.58*  --   CALCIUM 10.1  --     GFR: Estimated Creatinine Clearance: 49.5 mL/min (A) (by C-G formula based on SCr of 1.58 mg/dL (H)). Recent Labs  Lab 09/08/22 2206 09/08/22 2230 09/09/22 0230  WBC 12.6*  --  11.1*  LATICACIDVEN  --  2.1*  --     Liver Function Tests: Recent Labs  Lab 09/08/22 2206  AST 28  ALT 17  ALKPHOS 85  BILITOT 1.7*  PROT 7.3  ALBUMIN 3.9   No results for input(s): "LIPASE", "AMYLASE" in the last 168 hours. Recent Labs  Lab 09/09/22 0127  AMMONIA 22    ABG    Component Value Date/Time   HCO3 24.5 09/08/2022 2256   TCO2 26 09/08/2022 2256   ACIDBASEDEF 1.0 09/08/2022 2256   O2SAT 89 09/08/2022 2256     Coagulation Profile: Recent Labs  Lab 09/08/22 2206  INR 1.0    Cardiac Enzymes: Recent Labs  Lab 09/08/22 2257  CKTOTAL 76    HbA1C: HbA1c, POC (controlled diabetic range)  Date/Time Value Ref Range Status  10/30/2021 09:32 AM 7.8 (A) 0.0 - 7.0 % Final  05/01/2021 08:38 AM 6.9 0.0 - 7.0 % Final   Hgb A1c MFr Bld  Date/Time Value Ref Range Status  06/10/2022 01:12 AM 6.6 (H) 4.8 - 5.6 % Final    Comment:    (NOTE) Pre diabetes:          5.7%-6.4%  Diabetes:              >6.4%  Glycemic control for   <7.0% adults with diabetes   06/29/2021 02:48 PM 6.9 (H) 4.8 - 5.6 % Final    Comment:    (NOTE) Pre diabetes:          5.7%-6.4%  Diabetes:              >6.4%  Glycemic control for   <7.0% adults with diabetes     CBG: Recent Labs  Lab 09/08/22 2225  GLUCAP 146*    Review of Systems:   Patient is encephalopathic and/or intubated; therefore, history has been obtained from chart review.    Past Medical History:  She,  has a past medical history of Alcoholism in remission (HCC), Asthma, Bipolar disorder (HCC), Chronic diastolic CHF (congestive heart failure) (HCC) (2014), Chronic hypoxemic respiratory failure (HCC), CKD (chronic kidney disease), stage  III (HCC), COPD (chronic obstructive pulmonary disease) (HCC), Depression, Diabetes  mellitus type 2, diet-controlled (HCC), Dyspnea, Fibrocystic breast disease, GAD (generalized anxiety disorder), GERD (gastroesophageal reflux disease), Hepatitis B surface antigen positive (11/2017), History of cervical dysplasia (04/10/2011), History of condyloma acuminatum (04/09/2011), Hyperlipidemia, Hypertension, Incomplete left bundle branch block (LBBB), Lung nodule (11/2017), Muscle spasm of both lower legs, OA (osteoarthritis) (04/10/2011), OSA (obstructive sleep apnea) (2009), Polyneuropathy, Polysubstance abuse (HCC), PSVT (paroxysmal supraventricular tachycardia), Spinal stenosis, unspecified spinal region, Urge incontinence of urine, and Wears dentures.   Surgical History:   Past Surgical History:  Procedure Laterality Date   ANAL FISTULECTOMY  04/17/2011   Procedure: FISTULECTOMY ANAL;  Surgeon: Wilmon Arms. Corliss Skains, MD;  Location: [facility];  Service: General;  Laterality: N/A;  Excision of Condyloma Gluteal Cleft    ANAL RECTAL MANOMETRY N/A 03/26/2019   Procedure: ANO RECTAL MANOMETRY;  Surgeon: Tressia Danas, MD;  Location: [facility];  Service: Gastroenterology;  Laterality: N/A;   BIOPSY  03/09/2019   Procedure: BIOPSY;  Surgeon: Tressia Danas, MD;  Location: [facility];  Service: Gastroenterology;;   BOTOX INJECTION N/A 12/29/2018   Procedure: BOTOX INJECTION(100 UNITS)  WITH CYSTOSCOPY;  Surgeon: Rene Paci, MD;  Location: [facility];  Service: Urology;  Laterality: N/A;   BOTOX INJECTION N/A 07/27/2019   Procedure: BOTOX INJECTION WITH CYSTOSCOPY;  Surgeon: Rene Paci, MD;  Location: [facility];  Service: Urology;  Laterality: N/A;  ONLY NEEDS 30 MIN   BOTOX INJECTION N/A 05/02/2021   Procedure: BOTOX INJECTION WITH CYSTOSCOPY;  Surgeon: Rene Paci, MD;  Location: [facility];  Service: Urology;  Laterality: N/A;  ONLY NEEDS 30 MIN   BOTOX INJECTION N/A 11/28/2021   Procedure: BOTOX INJECTION WITH CYSTOSCOPY, 200  UNITS;  Surgeon: Rene Paci, MD;  Location: [facility];  Service: Urology;  Laterality: N/A;  ONLY NEEDS 30 MIN   BOTOX INJECTION N/A 05/15/2022   Procedure: BOTOX INJECTION 200 UNITS;  Surgeon: Rene Paci, MD;  Location: [facility];  Service: Urology;  Laterality: N/A;   BREAST EXCISIONAL BIOPSY Right    x 3   BREAST EXCISIONAL BIOPSY Left    12-29-2000  and 11-30-2001 @[facility]  by dr Carolynne Edouard   CARPAL TUNNEL RELEASE Bilateral    right 08-30-2002 and left 07-04-2003  by dr Otelia Sergeant @ [facility]   CATARACT EXTRACTION W/ INTRAOCULAR LENS IMPLANT Bilateral 12/2020   CERVICAL FUSION  04/10/2011   x3- cervical fusion with plating and screws-Dr. Noel Gerold   COLONOSCOPY WITH PROPOFOL N/A 07/06/2015   Procedure: COLONOSCOPY WITH PROPOFOL;  Surgeon: Dorena Cookey, MD;  Location: [facility];  Service: Endoscopy;  Laterality: N/A;   COLONOSCOPY WITH PROPOFOL N/A 03/09/2019   Procedure: COLONOSCOPY WITH PROPOFOL;  Surgeon: Tressia Danas, MD;  Location: [facility];  Service: Gastroenterology;  Laterality: N/A;   CYSTOSCOPY N/A 05/15/2022   Procedure: CYSTOSCOPY;  Surgeon: Rene Paci, MD;  [facility];  Service: Urology;  Laterality: N/A;   FRACTURE SURGERY     little toe left foot   I & D EXTREMITY Left 12/18/2016   Procedure: IRRIGATION AND DEBRIDEMENT LEFT FOOT, CLOSURE;  Surgeon: Tarry Kos, MD;  Location: [facility];  Service: Orthopedics;  Laterality: Left;   INCISION AND DRAINAGE ABSCESS Right 05/03/2002   @[facility]  by dr Carolynne Edouard;   right breast abscess   INJECTION KNEE Right 10/03/2020   Procedure: KNEE INJECTION;  Surgeon: Kerrin Champagne, MD;  Location: [facility];  Service:  Orthopedics;  Laterality: Right;   KNEE ARTHROSCOPY WITH MEDIAL MENISECTOMY Right 03/21/2021   Procedure: RIGHT KNEE ARTHROSCOPY WITH MEDIAL AND PARTIAL LATERAL MENISCECTOMY;  Surgeon: Tarry Kos, MD;  Location: [facility];  Service:  Orthopedics;  Laterality: Right;   LAPAROSCOPIC GASTRIC SLEEVE RESECTION N/A 05/08/2020   Procedure: LAPAROSCOPIC GASTRIC SLEEVE RESECTION;  Surgeon: Sheliah Hatch De Blanch, MD;  Location: [facility];  Service: General;  Laterality: N/A;   LUMBAR DISC SURGERY  04/10/2011   x5-Lumbar fusion-retained hardware.(Dr. Otelia Sergeant)   LUMBAR LAMINECTOMY N/A 10/03/2020   Procedure: THORACOLUMBAR LAMINECTOMIES THORACIC ELEVEN-TWELVE, THORACIC TWELVE-LUMBAR ONE  AND LUMBAT ONE-TWO;  Surgeon: Kerrin Champagne, MD;  Location: [facility];  Service: Orthopedics;  Laterality: N/A;   LUMBAR PERCUTANEOUS PEDICLE SCREW 2 LEVEL  06/29/2021   Procedure: LUMBAR PERCUTANEOUS PEDICLE SCREW LUMBAR ONE-TWO;  Surgeon: Julio Sicks, MD;  Location: [facility];  Service: Neurosurgery;;   MULTIPLE TOOTH EXTRACTIONS     POLYPECTOMY  03/09/2019   Procedure: POLYPECTOMY;  Surgeon: Tressia Danas, MD;  Location: [facility];  Service: Gastroenterology;;   POSTERIOR LUMBAR FUSION  04/04/2005   @[facility]  by dr Otelia Sergeant;   L3-- L5   POSTERIOR LUMBAR FUSION  03/04/2006   @[facility]  dr Otelia Sergeant;   fusion L2--3 and redo L3--5   SHOULDER ARTHROSCOPY WITH ROTATOR CUFF REPAIR AND SUBACROMIAL DECOMPRESSION Left 05/07/2019   Procedure: LEFT SHOULDER ARTHROSCOPY WITH DEBRIDEMENT, DISTAL CLAVICLE EXCISION,  SUBACROMIAL DECOMPRESSION AND POSSIBLE ROTATOR CUFF REPAIR;  Surgeon: Tarry Kos, MD;  Location: [facility];  Service: Orthopedics;  Laterality: Left;   TEE WITHOUT CARDIOVERSION N/A 04/06/2012   Procedure: TRANSESOPHAGEAL ECHOCARDIOGRAM (TEE);  Surgeon: Chrystie Nose, MD;  Location: [facility];  Service: Cardiovascular;  Laterality: N/A;   TRIGGER FINGER RELEASE Right    middle finger   TRIGGER FINGER RELEASE Left 06/28/2016   Procedure: RELEASE TRIGGER FINGER LEFT 3RD FINGER;  Surgeon: Tarry Kos, MD;  Location: [facility];  Service: Orthopedics;  Laterality: Left;   TRIGGER FINGER RELEASE Right 06/12/2016   Procedure: RIGHT INDEX FINGER TRIGGER RELEASE;  Surgeon: Tarry Kos, MD;  Location: [facility];  Service: Orthopedics;  Laterality: Right;   TRIGGER FINGER RELEASE Right 01/19/2018   Procedure: RIGHT RING FINGER TRIGGER FINGER RELEASE;  Surgeon: Tarry Kos, MD;  Location: [facility];  Service: Orthopedics;  Laterality: Right;   TRIGGER FINGER RELEASE Left 05/07/2019   Procedure: RELEASE TRIGGER FINGER LEFT INDEX FINGER;  Surgeon: Tarry Kos, MD;  Location: [facility];  Service: Orthopedics;  Laterality: Left;   UPPER GI ENDOSCOPY N/A 05/08/2020   Procedure: UPPER GI ENDOSCOPY;  Surgeon: Sheliah Hatch, De Blanch, MD;  Location: [facility];  Service: General;  Laterality: N/A;     Social History:   reports that she has been smoking cigarettes. She started smoking about 45 years ago. She has a 22.8 pack-year smoking history. She has been exposed to tobacco smoke. She has never used smokeless tobacco. She reports that she does not currently use alcohol. She reports that she does not currently use drugs.   Family History:  Her family history includes Alcohol abuse in her father; Arthritis in her sister; Asthma in her sister; Breast cancer in her maternal aunt; Depression in her sister; Diabetes in an other family member; Hypertension in her father and another family member; Pneumonia in her father; Pulmonary embolism in her mother; Sickle cell anemia in her son; Stroke in her father.   Allergies Allergies  Allergen Reactions   Aspirin Other (  See Comments)    Kidney disease   Methadone Hcl Other (See Comments)     "blacked out" in 1990's   Levofloxacin Itching     Home Medications  Prior to Admission medications   Medication Sig Start Date End Date Taking? Authorizing Provider  acetaminophen (TYLENOL) 500 MG tablet Take 1,000 mg by mouth 2 (two) times daily.    [provider]  albuterol (VENTOLIN HFA) 108 (90 Base) MCG/ACT inhaler INHALE TWO puffs into THE lungs EVERY SIX HOURS AS NEEDED wheezind/ For SHORTNESS OF BREATH related TO acute respiratory  failure WITH hypoxia 08/20/22   Jerre Simon, MD  ammonium lactate (LAC-HYDRIN) 12 % lotion For dry skin Patient taking differently: Apply 1 Application topically 2 (two) times daily. For dry skin 08/29/21   Jerre Simon, MD  atorvastatin (LIPITOR) 40 MG tablet TAKE ONE TABLET BY MOUTH EVERY DAY Patient taking differently: Take 40 mg by mouth daily. 05/02/22   Jerre Simon, MD  baclofen (LIORESAL) 10 MG tablet Take 10 mg by mouth 3 (three) times daily.    [provider]  Buprenorphine HCl 900 MCG FILM Place 1 Film inside cheek 2 (two) times daily.    [provider]  busPIRone (BUSPAR) 10 MG tablet TAKE ONE TABLET BY MOUTH THREE TIMES DAILY Patient taking differently: Take 10 mg by mouth 3 (three) times daily. 02/19/22   Jerre Simon, MD  calcium carbonate (OSCAL) 1500 (600 Ca) MG TABS tablet Take 1,500 mg by mouth 2 (two) times daily.    [provider]  cloNIDine (CATAPRES) 0.1 MG tablet Take 1 tablet (0.1 mg total) by mouth 2 (two) times daily. 09/02/22 11/01/22  Jerre Simon, MD  diclofenac Sodium (VOLTAREN) 1 % GEL APPLY TWO grams topically FOUR TIMES DAILY 08/28/22   Jerre Simon, MD  DILT-XR 120 MG 24 hr capsule TAKE ONE CAPSULE BY MOUTH TWICE DAILY related TO essential Primary hypertension 08/20/22   Jerre Simon, MD  diltiazem (CARDIZEM CD) 120 MG 24 hr capsule Take 1 capsule (120 mg total) by mouth 2 (two) times daily. 07/31/22   Alver Sorrow, NP  fluticasone (FLONASE) 50 MCG/ACT nasal spray INSTILL TWO SPRAYS INTO BOTH NOSTRILS TWICE DAILY Patient taking differently: Place 2 sprays into both nostrils 2 (two) times daily. INSTILL TWO SPRAYS INTO BOTH NOSTRILS TWICE DAILY 10/03/21   Jerre Simon, MD  Fluticasone-Umeclidin-Vilant (TRELEGY ELLIPTA) 100-62.5-25 MCG/ACT AEPB Inhale 1 puff into the lungs daily. 07/31/22   Alver Sorrow, NP  furosemide (LASIX) 80 MG tablet TAKE ONE TABLET BY MOUTH TWICE DAILY related TO acute ON systolic (congestive heart failure)  08/20/22   Jerre Simon, MD  gabapentin (NEURONTIN) 300 MG capsule Take 300 mg by mouth 2 (two) times daily. 07/23/22   [provider]  gabapentin (NEURONTIN) 300 MG capsule Take 600 mg by mouth at bedtime.    [provider]  JARDIANCE 10 MG TABS tablet give ONE tablet BY MOUTH EVERY DAY related TO type TWO diabetes mellitus WITH diabetic chronic kidney disease 08/20/22   Jerre Simon, MD  linaclotide [facility]) 72 MCG capsule Take 72 mcg by mouth daily before breakfast.    [provider]  loratadine (CLARITIN) 10 MG tablet TAKE ONE TABLET BY MOUTH EVERY DAY FOR allergies 08/20/22   Jerre Simon, MD  losartan (COZAAR) 25 MG tablet Take 1 tablet (25 mg total) by mouth at bedtime. Patient taking differently: Take 25 mg by mouth at bedtime. 12/04/21   Jerre Simon, MD  montelukast (SINGULAIR) 10  MG tablet TAKE 1 TABLET BY MOUTH EVERYDAY AT BEDTIME Strength: 10 mg Patient taking differently: Take 10 mg by mouth in the morning. 08/15/21   Jerre Simon, MD  Multiple Vitamins-Minerals (BARIATRIC MULTIVITAMINS/IRON) CAPS Take 1 capsule by mouth 2 (two) times daily.    [provider]  NARCAN 4 MG/0.1ML LIQD nasal spray kit Place 0.4 mg into the nose once as needed (accidental overdose). 02/16/18   [provider]  nystatin (MYCOSTATIN) 100000 UNIT/ML suspension Take 5 mLs (500,000 Units total) by mouth 4 (four) times daily. Swish and swallow. Patient taking differently: Take 5 mLs by mouth 4 (four) times daily as needed (qid prn). Swish and swallow. 03/27/22   Jerre Simon, MD  oxyCODONE (OXY IR/ROXICODONE) 5 MG immediate release tablet Take 1 tablet (5 mg total) by mouth every 4 (four) hours as needed (sever back pain). 08/28/22   Jerre Simon, MD  spironolactone (ALDACTONE) 25 MG tablet give ONE-HALF TABLET BY MOUTH EVERY DAY FOR chronic diastolic (congestive heart failure) 08/20/22   Jerre Simon, MD  venlafaxine XR (EFFEXOR-XR) 75 MG 24 hr capsule TAKE ONE  CAPSULE BY MOUTH EVERY DAY related TO major depressive disorder 08/20/22   Jerre Simon, MD     Critical care time: 50 minutes    JD Anselm [facility] 09/09/2022, 4:02 AM  Please see [email or web address] for pager details.  From 7A-7P if no response, please call [phone]. After hours, please call ELink [phone].

## 2022-09-09 NOTE — Assessment & Plan Note (Deleted)
-   Hgb now 16.5 (down from 19.4 on admission), also mild leukocytosis to 11.2 - Likely due to hemoconcentration - Trend with daily CBC

## 2022-09-09 NOTE — ED Notes (Signed)
ETT in @ 0341  + color change @ 0341  23 @ the teeth

## 2022-09-09 NOTE — ED Notes (Signed)
Entered pts room, pt not speaking, staring off. Pt began to seize at that time, patient turned to side and code alarm pulled. MD and PA at bedside

## 2022-09-09 NOTE — ED Notes (Signed)
Propofol bolus 50mg  per [facility] EDP.

## 2022-09-09 NOTE — Progress Notes (Signed)
Advanced ETT 2cm per xray/ PA order. Tube is npw 25 at teeth/gum.

## 2022-09-09 NOTE — Hospital Course (Addendum)
[name] is a 57 y.o. female who was admitted to the [facility] Teaching Service at [facility] for AMS. Hospital course is outlined below by system.    AMS Patient was found by son confused and called EMS who brought patient to the ED for altered mental status. LKW was at 9 AM per son.  Unknown cause of patient's altered mental status as initial labs with UDS/ETOH was negative, ammonia WNL, no significant electrolyte abnormality, head CT without any intracranial process.  In the ED patient was awake but having disorganized thought process, tangential answers and echolalia prompting recommendation for admission.  While patient was being prepped for admission, had a seizure and was given Ativan and was intubated for airway protection. Was also loaded with Keppra and sedated on propofol drip. Was promptly transferred to the ICU.  In the ICU, the patient was diagnosed with acute encephalopathy secondary to hypertensive emergency vs opiate withdrawal vs PRES. Cleviprex drip was added on 8/12 for blood pressure support and possible PRES, which was later ruled out on MRI. Cleviprex was discontinued and home Cozaar 100mg  daily and Dilitazem 30mg  q6hr were started on 8/13 with good blood pressure control. Sedation was discontinued with stable blood pressures. Spironolactone 25mg  daily, coreg 12.5mg  BID and clonidine 0.2mg  BID were added.  She ultimately completed cessation of opioid medications.  Transferred back to FMTS after AMS resolved.  She was advised to stay for PT/OT needs however preferred going home to be with daughter-in-law and son.   Seizure Suspected to be secondary to hypertensive emergency.  In the ED patient had seizure and was treated with Ativan followed by loading dose of Keppra 3000 mg IV x 1. She was also started on thiamine. Brain MRI on 8/12 showed no acute abnormalities. Neurology was consulted and patient had continuous long-term EEG that showed no seizure activity. Believe seizure  was secondary to hypertensive emergency, no need for AEDs.  PCP follow-up recommendations Needs Med rec F/u on TSH/T4 -recheck in 4 to 6 weeks Discuss starting SSRI instead of SNRI she was previously taking Recommend following up with pain medicine for pain needs

## 2022-09-09 NOTE — TOC CM/SW Note (Signed)
Transition of Care [facility]) - Inpatient Brief Assessment   Patient Details  Name: [name] MRN: [id number] Date of Birth: 09-25-1965  Transition of Care [facility]) CM/SW Contact:    Mearl Latin, LCSW Phone Number: 09/09/2022, 5:29 PM   Clinical Narrative: Patient admitted from home. No current TOC needs identified but will continue to follow as patient is currently intubated.    Transition of Care Asessment: Insurance and Status: Insurance coverage has been reviewed Patient has primary care physician: Yes Home environment has been reviewed: From home Prior level of function:: Independent Prior/Current Home Services: No current home services Social Determinants of Health Reivew: SDOH reviewed no interventions necessary Readmission risk has been reviewed: Yes Transition of care needs: no transition of care needs at this time

## 2022-09-09 NOTE — Assessment & Plan Note (Addendum)
LKW was 8/10 at 9am. UDS unremarkable and initially generally unremarkable without any significant electrolyte abnormality. Unclear cause of patient's AMS. - admit to FMTS, progressive,  -Obtain labs for reversible causes: RPR, Lupus, B12, Folate, TSH - avoid CNS acting medications - MRI pending  - repeat BMP  - trend lactatic acid

## 2022-09-09 NOTE — ED Provider Notes (Incomplete)
[facility] EMERGENCY DEPARTMENT AT [facility] Provider Note   CSN: [id number] Arrival date & time: 09/08/22  2153     History {Add pertinent medical, surgical, social history, OB history to HPI:1} Chief Complaint  Patient presents with  . Altered Mental Status    [name] is a 57 y.o. female.  HPI     Home Medications Prior to Admission medications   Medication Sig Start Date End Date Taking? Authorizing Provider  acetaminophen (TYLENOL) 500 MG tablet Take 1,000 mg by mouth 2 (two) times daily.    [provider]  albuterol (VENTOLIN HFA) 108 (90 Base) MCG/ACT inhaler INHALE TWO puffs into THE lungs EVERY SIX HOURS AS NEEDED wheezind/ For SHORTNESS OF BREATH related TO acute respiratory failure WITH hypoxia 08/20/22   Jerre Simon, MD  ammonium lactate (LAC-HYDRIN) 12 % lotion For dry skin Patient taking differently: Apply 1 Application topically 2 (two) times daily. For dry skin 08/29/21   Jerre Simon, MD  atorvastatin (LIPITOR) 40 MG tablet TAKE ONE TABLET BY MOUTH EVERY DAY Patient taking differently: Take 40 mg by mouth daily. 05/02/22   Jerre Simon, MD  baclofen (LIORESAL) 10 MG tablet Take 10 mg by mouth 3 (three) times daily.    [provider]  Buprenorphine HCl 900 MCG FILM Place 1 Film inside cheek 2 (two) times daily.    [provider]  busPIRone (BUSPAR) 10 MG tablet TAKE ONE TABLET BY MOUTH THREE TIMES DAILY Patient taking differently: Take 10 mg by mouth 3 (three) times daily. 02/19/22   Jerre Simon, MD  calcium carbonate (OSCAL) 1500 (600 Ca) MG TABS tablet Take 1,500 mg by mouth 2 (two) times daily.    [provider]  cloNIDine (CATAPRES) 0.1 MG tablet Take 1 tablet (0.1 mg total) by mouth 2 (two) times daily. 09/02/22 11/01/22  Jerre Simon, MD  diclofenac Sodium (VOLTAREN) 1 % GEL APPLY TWO grams topically FOUR TIMES DAILY 08/28/22   Jerre Simon, MD  DILT-XR 120 MG 24 hr capsule TAKE ONE CAPSULE BY MOUTH  TWICE DAILY related TO essential Primary hypertension 08/20/22   Jerre Simon, MD  diltiazem (CARDIZEM CD) 120 MG 24 hr capsule Take 1 capsule (120 mg total) by mouth 2 (two) times daily. 07/31/22   Alver Sorrow, NP  fluticasone (FLONASE) 50 MCG/ACT nasal spray INSTILL TWO SPRAYS INTO BOTH NOSTRILS TWICE DAILY Patient taking differently: Place 2 sprays into both nostrils 2 (two) times daily. INSTILL TWO SPRAYS INTO BOTH NOSTRILS TWICE DAILY 10/03/21   Jerre Simon, MD  Fluticasone-Umeclidin-Vilant (TRELEGY ELLIPTA) 100-62.5-25 MCG/ACT AEPB Inhale 1 puff into the lungs daily. 07/31/22   Alver Sorrow, NP  furosemide (LASIX) 80 MG tablet TAKE ONE TABLET BY MOUTH TWICE DAILY related TO acute ON systolic (congestive heart failure) 08/20/22   Jerre Simon, MD  gabapentin (NEURONTIN) 300 MG capsule Take 300 mg by mouth 2 (two) times daily. 07/23/22   [provider]  gabapentin (NEURONTIN) 300 MG capsule Take 600 mg by mouth at bedtime.    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    [provider]  loratadine (CLARITIN) 10 MG tablet TAKE ONE TABLET BY MOUTH EVERY DAY FOR allergies 08/20/22   Jerre Simon, MD  losartan (COZAAR) 25  MG tablet Take 1 tablet (25 mg total) by mouth at bedtime. Patient taking differently: Take 25 mg by mouth at bedtime. 12/04/21   Jerre Simon, MD  montelukast (SINGULAIR) 10 MG tablet TAKE 1 TABLET BY MOUTH EVERYDAY AT BEDTIME Strength: 10 mg Patient taking differently: Take 10 mg by mouth in the morning. 08/15/21   Jerre Simon, MD  Multiple Vitamins-Minerals (BARIATRIC MULTIVITAMINS/IRON) CAPS Take 1 capsule by mouth 2 (two) times daily.    [provider]  NARCAN 4 MG/0.1ML LIQD nasal spray kit Place 0.4 mg into the  nose once as needed (accidental overdose). 02/16/18   [provider]  nystatin (MYCOSTATIN) 100000 UNIT/ML suspension Take 5 mLs (500,000 Units total) by mouth 4 (four) times daily. Swish and swallow. Patient taking differently: Take 5 mLs by mouth 4 (four) times daily as needed (qid prn). Swish and swallow. 03/27/22   Jerre Simon, MD  oxyCODONE (OXY IR/ROXICODONE) 5 MG immediate release tablet Take 1 tablet (5 mg total) by mouth every 4 (four) hours as needed (sever back pain). 08/28/22   Jerre Simon, MD  spironolactone (ALDACTONE) 25 MG tablet give ONE-HALF TABLET BY MOUTH EVERY DAY FOR chronic diastolic (congestive heart failure) 08/20/22   Jerre Simon, MD  venlafaxine XR (EFFEXOR-XR) 75 MG 24 hr capsule TAKE ONE CAPSULE BY MOUTH EVERY DAY related TO major depressive disorder 08/20/22   Jerre Simon, MD      Allergies    Aspirin, Methadone hcl, and Levofloxacin    Review of Systems   Review of Systems  Physical Exam Updated Vital Signs BP (!) 189/121   Pulse 100   Temp (!) 97.5 F (36.4 C) (Oral)   Resp 17   Ht 5\' 9"  (1.753 m)   Wt 105.1 kg   LMP 01/29/1992   SpO2 100%   BMI 34.22 kg/m  Physical Exam  ED Results / Procedures / Treatments   Labs (all labs ordered are listed, but only abnormal results are displayed) Labs Reviewed  CBG MONITORING, ED - Abnormal; Notable for the following components:      Result Value   Glucose-Capillary 146 (*)    All other components within normal limits  COMPREHENSIVE METABOLIC PANEL  CBC  PROTIME-INR    EKG None  Radiology No results found.  Procedures Procedures  {Document cardiac monitor, telemetry assessment procedure when appropriate:1}  Medications Ordered in ED Medications - No data to display  ED Course/ Medical Decision Making/ A&P   {   Click here for ABCD2, HEART and other calculatorsREFRESH Note before signing :1}                              Medical Decision Making Amount and/or Complexity of  Data Reviewed Labs: ordered. Radiology: ordered.   ***  {Document critical care time when appropriate:1} {Document review of labs and clinical decision tools ie heart score, Chads2Vasc2 etc:1}  {Document your independent review of radiology images, and any outside records:1} {Document your discussion with family members, caretakers, and with consultants:1} {Document social determinants of health affecting pt's care:1} {Document your decision making why or why not admission, treatments were needed:1} Final Clinical Impression(s) / ED Diagnoses Final diagnoses:  None    Rx / DC Orders ED Discharge Orders     None

## 2022-09-09 NOTE — Progress Notes (Signed)
LTM EEG hooked up and running - no initial skin breakdown - push button tested - [facility] monitoring.  

## 2022-09-09 NOTE — Assessment & Plan Note (Deleted)
Creatinine on admission was 1.58 with a GFR of 37. Now 0.85 with GFR>60. Baseline creatinine around 1.1. Suspect possible AKI on CKD in the setting of poor p.o. intake. -S/p 2 L bolus LR. -Trend creatinine with daily BMP -Avoid nephrotoxic agent.

## 2022-09-09 NOTE — Assessment & Plan Note (Deleted)
AMS suspected 2/2 hypertensive emergency - HIV, RPR nonreactive, B12, Folate wnl - TSH 0.27, T4 1.16 in ED - repeat outpatient - avoid CNS acting medications (Holding home gabapentin) - MRI - No acute or reversible finding, Extensive chronic small vessel ischemia. - repeat BMP  - trend lactic acid? - Fall precaution - Neuro check Q4H - Lovenox for VTE prophylaxis

## 2022-09-09 NOTE — Plan of Care (Signed)
  Problem: Pain Managment: Goal: General experience of comfort will improve Outcome: Progressing   Problem: Safety: Goal: Ability to remain free from injury will improve Outcome: Progressing   Problem: Skin Integrity: Goal: Risk for impaired skin integrity will decrease Outcome: Progressing   

## 2022-09-09 NOTE — H&P (Addendum)
Hospital Admission History and Physical Service Pager: [phone]  Patient name: [name] Medical record number: [id number] Date of Birth: 12-Dec-1965 Age: 57 y.o. Gender: female  Primary Care Provider: Jerre Simon, MD Consultants: None, consider neuro or psych  Code Status: Full Code, attempted to reach son Dondra Prader via cell phone, patient has been full code for prior admissions, will confirm code status at another time  Preferred Emergency Contact: Presley Raddle, [phone]  Chief Complaint: Altered Mental Status   Assessment and Plan: VICKEY SOLESBEE is a 57 y.o. female presenting with altered mental status.  Differential for presentation includes drug overdose which is less likely given negative UDS, metabolic derangements less suspected given generally normal electrolytes except low TSH that could reflect hyperthyroidism, infectious causes unlikely given absence of fever and mild leukocytosis that is likely d/t hemoconcentration, head trauma or brain injury is less likely given negative head CT but will follow up with MRI.  Though not definitive suspect patient's symptoms could be medication induced given patient is on multiple CNS acting medications including gabapentin and clonidine.  Of note patient recently was started on clonidine for evaluation of opiate withdrawal symptoms.  Although rare clonidine could cause confusion.  Will hold these medications,  continue to monitor patient closely while awaiting lab results.  [facility]     * (Principal) AMS (altered mental status)     LKW was 8/10 at 9am. UDS unremarkable and initially generally  unremarkable without any significant electrolyte abnormality. Unclear  cause of patient's AMS with broad differentials.  Will obtain MRI, order  lab for reversible causes and hold CNS acting medications at this time.  - admit to FMTS, progressive,  -Obtain labs for reversible causes: HIV, RPR, B12, Folate, T4 - avoid CNS  acting medications (Holding home gabapentin, clonidine) - MRI pending  - repeat BMP  - trend lactatic acid -Fall precaution -1:1 sitter -Neuro check Q4H -NPO -Levonox for VTE propholaxis          Chronic kidney disease (CKD), stage IV (severe) (HCC)     Creatinine on admission was 1.58 with a GFR of 37.  Baseline creatinine  around 1.1.  Suspect possible AKI on CKD in the setting of poor p.o.  intake. -S/p 2 L bolus LR. -Trend creatinine with daily BMP -Avoid nephrotoxic agent.        Polycythemia, secondary     Hgb of 19.4.  This is a chronic issue for patient but seems to be more  elevated than baseline.  Suspect likely hemoconcentration in the setting  of decreased p.o. intake vs hydration.  Also have mild leukocytosis to  11.1. -S/p 2L bolus fluid in the ED - Trend with daily CBC      FEN/GI: NPO  VTE Prophylaxis: Holding for now   Disposition: Progressive   History of Present Illness:  [name] is a 57 y.o. female presenting with altered mental status.   Upon interview the patient is able to identify me as her new doctor, however unable to state my name. Her responses are disorganized and tangential. When asked where she is, she says " where the children are" or references "pickles". The patient is conversational, however does not answer questions appropriately. Able to participate in some portions of the physical exam, but doesn't follow commands for others. Able to acknowledge pain stimuli.   History very limited due to patient's mental status. Attempt to reach were unsuccessful, with continue attempt  to reach son.   According to report from ED provider patient was found fused at home by son on 8/11. LKW was 8/10 AT 9AM.  Of note patient was  recently started on 0.1 mg twice daily to prevent opioid withdrawal.  In the ED, the patient labs were collected CBC w/ elevated 12.6 WBC. H/H 19.6/57.8. K 5.9., Cr 1.58 elevated from baseline of 0.9. UA w/ ketones,  negative for nitrites. UDS and ETOH were negative. Lactic acid 2.1 and VBG w/o acidosis. Trops negative and EKE w/ afib. CXR negative for acute process, stable cardiomegaly. She was given 1L LR bolus,1L NS bolus and labetalol 5 mg.   Review Of Systems: Per HPI with the following additions:   Pertinent Past Medical History: HTN CHF  OSA  COPD  DM  CKD IV  DM  Chronic back pain  Polyneuropathy  Anxiety  Depression Bipolar Disorder   Remainder reviewed in history tab.   Pertinent Past Surgical History: Right knee arthroscopy  Left shoulder arthroscopy  Posterior lumbar fusion  Lumbar laminectomy  Cervical fusion   Remainder reviewed in history tab.   Pertinent Social History: Tobacco use: Yes Alcohol use: Occasional Other Substance use: Prior cocaine use, been clean for months Lives alone   Pertinent Family History: Mother PE Father Stroke, EtOH abuse, HTN Sister Asthma, Depression   Remainder reviewed in history tab.   Important Outpatient Medications: Clonidine 0.1 mg BID  Gabapentin 600 mg at bedtime  Albuterol inhaler 2 pufs q6h prn  Trelegy ellipta daily  Singulair 10 mg at bedtime  Fluticasone BID  Atorvastatin 40 mg daily  Baclofen 10 mg TID  Buspirone 10 mg TID  Diltazem 120 mg BID  Losartan 25 mg at bedtime Spironolactone 25 mg daily  Venlafaxine 75 mg dailiy   Remainder reviewed in medication history.   Objective: BP (!) 185/97   Pulse (!) 103   Temp 99.8 F (37.7 C) (Rectal)   Resp 20   Ht 5\' 9"  (1.753 m)   Wt 105.1 kg   LMP 01/29/1992   SpO2 95%   BMI 34.22 kg/m  Exam: General: No acute distress, conversational, Morbidly Obese Eyes: eyes constricted and reactive to light  Cardiovascular: Irregular regular rhythm Respiratory: Normal work of breathing on RA, No wheezing  Gastrointestinal: Non-tender, soft,  MSK: UE extremity strength 4/5 Derm: No bruises identified Neuro: Disoriented to time, place, person, and setting, reponsive to  pain stimuli. Psych: Disorganized thought process, tangential in answers, echolalia   Labs:  CBC BMET  Recent Labs  Lab 09/09/22 0230  WBC 11.1*  HGB 19.4*  HCT 57.1*  PLT 239   Recent Labs  Lab 09/08/22 2206 09/08/22 2256  NA 141 140  K 4.8 5.9*  CL 102  --   CO2 21*  --   BUN 19  --   CREATININE 1.58*  --   GLUCOSE 115*  --   CALCIUM 10.1  --     Pertinent additional labs   UDS and ethanol negative .   Lactic Acid 2.1  CBG 146  EKG: Normal sinus rhythm without ST changes.  Imaging Studies Performed: CXR  Impression from Radiologist: Stable cardiomegaly. No acute abnormality noted.    CT Head w/o contrast  1. No acute intracranial abnormality. 2. Chronic small vessel disease.   Jerre Simon, MD 09/09/2022, 3:28 AM PGY-3, [facility]  FPTS Intern pager: [phone], text pages welcome Secure chat group [facility] Teaching Service

## 2022-09-10 DIAGNOSIS — R4182 Altered mental status, unspecified: Secondary | ICD-10-CM | POA: Diagnosis not present

## 2022-09-10 DIAGNOSIS — I161 Hypertensive emergency: Secondary | ICD-10-CM | POA: Diagnosis not present

## 2022-09-10 DIAGNOSIS — R569 Unspecified convulsions: Secondary | ICD-10-CM | POA: Diagnosis not present

## 2022-09-10 LAB — GLUCOSE, CAPILLARY
Glucose-Capillary: 132 mg/dL — ABNORMAL HIGH (ref 70–99)
Glucose-Capillary: 136 mg/dL — ABNORMAL HIGH (ref 70–99)
Glucose-Capillary: 145 mg/dL — ABNORMAL HIGH (ref 70–99)
Glucose-Capillary: 134 mg/dL — ABNORMAL HIGH (ref 70–99)
Glucose-Capillary: 115 mg/dL — ABNORMAL HIGH (ref 70–99)

## 2022-09-10 MED ORDER — POTASSIUM CHLORIDE 10 MEQ/100ML IV SOLN
10.0000 meq | INTRAVENOUS | Status: AC
  Administered 2022-09-10 (×4): 10 meq via INTRAVENOUS
  Filled 2022-09-10 (×4): qty 100

## 2022-09-10 MED ORDER — MAGNESIUM SULFATE 2 GM/50ML IV SOLN
2.0000 g | Freq: Once | INTRAVENOUS | Status: AC
  Administered 2022-09-10: 2 g via INTRAVENOUS
  Filled 2022-09-10: qty 50

## 2022-09-10 MED ORDER — POTASSIUM CHLORIDE 20 MEQ PO PACK
40.0000 meq | PACK | Freq: Once | ORAL | Status: AC
  Administered 2022-09-10: 40 meq
  Filled 2022-09-10: qty 2

## 2022-09-10 MED ORDER — DILTIAZEM HCL 30 MG PO TABS
30.0000 mg | ORAL_TABLET | Freq: Four times a day (QID) | ORAL | Status: DC
  Administered 2022-09-10 – 2022-09-11 (×6): 30 mg
  Filled 2022-09-10 (×6): qty 1

## 2022-09-10 MED ORDER — LOSARTAN POTASSIUM 50 MG PO TABS
50.0000 mg | ORAL_TABLET | Freq: Every day | ORAL | Status: DC
  Administered 2022-09-10 – 2022-09-11 (×2): 50 mg via ORAL
  Filled 2022-09-10 (×2): qty 1

## 2022-09-10 MED ORDER — DILTIAZEM HCL 30 MG PO TABS: 30.0000 mg | ORAL_TABLET | Freq: Four times a day (QID) | ORAL | Status: DC

## 2022-09-10 NOTE — Progress Notes (Addendum)
eLink Physician-Brief Progress Note Patient Name: [name] DOB: Feb 05, 1965 MRN: [id number]   Date of Service  09/10/2022  HPI/Events of Note  Notified oliguiria. UOP 225 in last 8 hours Bladder scan 0 On mIVF Cr  1.23  eICU Interventions  BMET pending this am Will message day team     Intervention Category Minor Interventions: Other:   Mechele Collin 09/10/2022, 5:14 AM

## 2022-09-10 NOTE — Progress Notes (Signed)
Decreased urinary output. 225cc in 8 hours , 28cc/hr. Bladder scan revealed 0 residual.

## 2022-09-10 NOTE — H&P (Signed)
NAME:  [name], MRN:  [id number], DOB:  03/13/1965, LOS: 1 ADMISSION DATE:  09/08/2022, CONSULTATION DATE:  8/12 REFERRING MD:  Dr Elliot Gurney, CHIEF COMPLAINT:  seizure   History of Present Illness:  Patient is a 57 year old female with pertinent PMH OHS/OSA, T2DM, CKD 4, COPD, diastolic CHF, alcoholism in remission, polysubstance abuse, lumbar radiculopathy chronic opioid use, bipolar, depression, HTN, HLD presents to [facility] on 8/11 w/ seizure.  On 8/11, patient send in to check on mother found her naked lying in bed talking delusional.  Bed noted to be soaked in urine.  EMS called for AMS the patient brought to [facility] ED. LKN 8/10 9 am.   Per son patient only drinks alcohol socially a few times a year and has had a previous history of cocaine use but has been clean for the past few months. Patient recently started on 0.1 clonidine bid to prevent opioid withdrawal.   Upon arrival patient lethargic but able to wake up but very confused.  Afebrile and hypertensive 189/121. Cbg 115. Ethanol, acetaminophen/salicylate level, UDS, ck, ammonia WNL. TSH 0.274 and t4 pending. CT head no acute abnormality. Patient given labetalol for htn. Patient admitted to family medicine service. MRI ordered to rule out pres and home meds gabapentin/clonidine held. HIV, RPR, B12, folate pending. While in ED patient had seizure episode requiring ativan and intubation for airway protection. Loaded w/ keppra. PCCM and neuro consulted.   Pertinent  Medical History   Past Medical History:  Diagnosis Date   Alcoholism in remission (HCC)    11-26-2021  pt stated no alcohol few yrs ago   Asthma    Bipolar disorder (HCC)    Chronic diastolic CHF (congestive heart failure) (HCC) 2014   followed by dr b. Cristal Deer;   Chronic hypoxemic respiratory failure [facility])    pulmonologist--- dr Tonia Brooms;   (11-26-2021 per pt was on supplemental oxygen until she 01/ 2022 did not need it,  does not check O2 sats at home anymore.   CKD  (chronic kidney disease), stage III [facility])    nephrologist--- dr Ronalee Belts   COPD (chronic obstructive pulmonary disease) (HCC)    11-26-2021 pt trying to quit smoking ,  started age 89   Depression    Diabetes mellitus type 2, diet-controlled (HCC)    followed by pcp   (11-26-2021  per pt last A1c by pcp on 10-30-2021 7.8,  stated does not check blood sugar)   Dyspnea    Fibrocystic breast disease    GAD (generalized anxiety disorder)    GERD (gastroesophageal reflux disease)    Hepatitis B surface antigen positive 11/2017   pt stated asymptomatic ,  no treatment   History of cervical dysplasia 04/10/2011   '93- once dx.-got pregnant-no intervention, then postpartum, no dysplasia found   History of condyloma acuminatum 04/09/2011   Hyperlipidemia    Hypertension    Incomplete left bundle branch block (LBBB)    Lung nodule 11/2017   pulmonologist--- dr Tonia Brooms;  RUL and RLL   Muscle spasm of both lower legs    chronic , due to back issues,   OA (osteoarthritis) 04/10/2011   hips, shoulders, back   OSA (obstructive sleep apnea) 2009   study on epic 09-18-2007 mild osa w/ hypoxmia ;   (11-26-2021 per pt last used cpap 01/ 2022 before bariatric surgery 04/ 2022 stated lost wt / and stopped needing supplemental oxygen , did not need anymore)   Polyneuropathy    due to back issues  and DM   Polysubstance abuse (HCC)    11-26-2021  pt hx cocaine use stated none for yrs but ED visit 08-14-2021  positive UDS,  pt stated it was once only for depression none since   PSVT (paroxysmal supraventricular tachycardia)    followed by cardiology   Spinal stenosis, unspecified spinal region    Urge incontinence of urine    Wears dentures    upper     Significant Hospital Events: Including procedures, antibiotic start and stop dates in addition to other pertinent events   8/12 admitted w/ AMS; intially concerned PRES; patient had seizure episode and intubated  Interim History / Subjective:   No  clinical seizure activity.  Weaned off Cleviprex.  Objective   Blood pressure 130/85, pulse 94, temperature 98.5 F (36.9 C), temperature source Axillary, resp. rate 20, height 5\' 9"  (1.753 m), weight 103.6 kg, last menstrual period 01/29/1992, SpO2 96%.    Vent Mode: PRVC FiO2 (%):  [40 %] 40 % Set Rate:  [20 bmp] 20 bmp Vt Set:  [520 mL] 520 mL PEEP:  [5 cmH20] 5 cmH20 Plateau Pressure:  [16 cmH20-17 cmH20] 17 cmH20   Intake/Output Summary (Last 24 hours) at 09/10/2022 0942 Last data filed at 09/10/2022 0500 Gross per 24 hour  Intake 1942.45 ml  Output 160 ml  Net 1782.45 ml   Filed Weights   09/08/22 2159 09/10/22 0348  Weight: 105.1 kg 103.6 kg    Examination: General: critically ill appearing on mech vent HEENT: MM pink/moist; ETT in place Neuro: sedated; PERRL CV: s1s2, RRR, no m/r/g PULM:  dim clear BS bilaterally; on mech vent PRVC GI: soft, bsx4 active  Extremities: warm/dry, no edema  Skin: no rashes or lesions   EEG shows no evidence of seizure activity or epileptiform discharges.  Ancillary Tests:  MRI shows no evidence of PRES but demonstrates chronic microvascular disease.  Assessment & Plan:   Acute metabolic encephalopathy due to polypharmacy and possible hypertensive encephalopathy. Concern for seizure but no with no EEG correlate. Hypoxic respiratory failure with history of OHS and COPD but primarily intubated for airway protection. HTN emergency now off Cleviprex Hx of diastolic CHF Hx of HTN/HLD AKI on CKD 4 T2dm Leukocytosis Low TSH Hx of alcoholism: in remission since 2023; per son only drinks 1-2 times per year Hx of polysubstance abuse: cocaine but has been clear for 2 months lumbar radiculopathy w/ chronic opioid use Bipolar Depression  Plan:  -Restart home Cozaar and diltiazem keep blood pressure under control with systolic less than 160 as propofol is weaned to off. -Stop sedation and allow patient to wake up. -SBT and extubate as  appropriate. -Keep off psychotropic medications for now and reintroduce sequentially once mental status has improved and patient is awake.  Best Practice (right click and "Reselect all SmartList Selections" daily)   Diet/type: NPO w/ meds via tube DVT prophylaxis: prophylactic heparin  GI prophylaxis: H2B Lines: N/A Foley:  N/A Code Status:  full code Last date of multidisciplinary goals of care discussion [8/12 updated son dominique over phone]  CRITICAL CARE Performed by: Lynnell Catalan   Total critical care time: 41 minutes  Critical care time was exclusive of separately billable procedures and treating other patients.  Critical care was necessary to treat or prevent imminent or life-threatening deterioration.  Critical care was time spent personally by me on the following activities: development of treatment plan with patient and/or surrogate as well as nursing, discussions with consultants, evaluation of patient's response  to treatment, examination of patient, obtaining history from patient or surrogate, ordering and performing treatments and interventions, ordering and review of laboratory studies, ordering and review of radiographic studies, pulse oximetry, re-evaluation of patient's condition and participation in multidisciplinary rounds.  Lynnell Catalan, MD [facility] ICU Physician [facility] [place] [facility]  Pager: [phone] Mobile: [phone] After hours: [phone].

## 2022-09-10 NOTE — Plan of Care (Signed)
  Problem: Clinical Measurements: Goal: Will remain free from infection Outcome: Progressing   Problem: Coping: Goal: Level of anxiety will decrease Outcome: Progressing   Problem: Skin Integrity: Goal: Risk for impaired skin integrity will decrease Outcome: Progressing

## 2022-09-10 NOTE — Plan of Care (Signed)
  Problem: Clinical Measurements: Goal: Respiratory complications will improve Outcome: Progressing   Problem: Elimination: Goal: Will not experience complications related to urinary retention Outcome: Not Progressing

## 2022-09-10 NOTE — Progress Notes (Addendum)
Pharmacy Electrolyte Replacement  Recent Labs:  Recent Labs    09/10/22 0522  K 2.9*  MG 1.8  PHOS 3.9  CREATININE 1.09*    Low Critical Values (K </= 2.5, Phos </= 1, Mg </= 1) Present: None  Plan: Will give Kcl PO and IV. Give 2g of mag sulfate. Recheck in AM.   Estill Batten, PharmD, BCCCP

## 2022-09-10 NOTE — Procedures (Signed)
Patient Name: [name]  MRN: [id number]  Epilepsy Attending: Charlsie Quest  Referring Physician/Provider: Marjorie Smolder, NP  Duration: 09/09/2022 1014 to 09/10/2022 1014  Patient history: 56 y.o. female with a PMHx of alcoholism in remission (as of 11/26/21 and family endorses only occasional EtOH socially a few times per year, polysubstance abuse (prior cocaine use but clear for the past few months, chronic opioid use, lumbar radiculopathy, CKD4, COPD, CHF, HTN, HLD, OSA and polyneuropathy who presents to the ED after she had a seizure. EEG to evaluate for seizure.  Level of alertness: comatose  AEDs during EEG study: Propofol  Technical aspects: This EEG study was done with scalp electrodes positioned according to the 10-20 International system of electrode placement. Electrical activity was reviewed with band pass filter of 1-70Hz , sensitivity of 7 uV/mm, display speed of 17mm/sec with a 60Hz  notched filter applied as appropriate. EEG data were recorded continuously and digitally stored.  Video monitoring was available and reviewed as appropriate.  Description: EEG showed continuous generalized 3 to 6 Hz theta-delta slowing admixed with an excessive amount of 15 to 18 Hz beta activity distributed symmetrically and diffusely. Brief 1-3 seconds of generalized attenuation was noted at times. Hyperventilation and photic stimulation were not performed.     ABNORMALITY - Continuous slow, generalized - Excessive beta, generalized  IMPRESSION: This study is suggestive of severe diffuse encephalopathy likely related to sedation. No seizures or epileptiform discharges were seen throughout the recording.   Annabelle Harman

## 2022-09-10 NOTE — Progress Notes (Signed)
Performed maintenance on leads.  All under 10 

## 2022-09-10 NOTE — Progress Notes (Signed)
Neurology Progress Note  Brief HPI: 57 year old patient with history of alcoholism and recent mission, polysubstance abuse, CKD, COPD, CHF, hypertension, hyperlipidemia obstructive sleep apnea and polyneuropathy presented to the ED after having a seizure.  She was found by her family in bed confused with urinary incontinence and brought to the ED.  While in the ED, she had a seizure and was treated with Ativan and then intubated for airway protection.  She was loaded on Keppra and started on thiamine.  She was quite hypertensive on arrival to the ED, and hypertensive emergency likely provoked the seizure.  MRI brain was negative for acute abnormality  Subjective: Patient is seen and examined in her room, she is on propofol but this was paused for exam.  EEG shows no seizure activity overnight.  Exam: Vitals:   09/10/22 0814 09/10/22 0816  BP:    Pulse:  94  Resp:  20  Temp:    SpO2: 95% 96%   Gen: In bed, NAD Resp: Respirations synchronous with ventilator Abd: soft, nt  Neuro (sedation with propofol paused): Mental Status: Patient does not respond to voice or follow commands.  She will attempt to localize noxious stimuli with right upper extremity but does not respond to peripheral noxious stimuli.  Pupils equal, round and reactive to light, oculocephalic reflex present, cough intact.  Pertinent Labs:    Latest Ref Rng & Units 09/10/2022    5:22 AM 09/09/2022    5:07 AM 09/09/2022    2:30 AM  CBC  WBC 4.0 - 10.5 K/uL 11.4   11.1   Hemoglobin 12.0 - 15.0 g/dL 78.2  95.6  21.3   Hematocrit 36.0 - 46.0 % 49.8  56.0  57.1   Platelets 150 - 400 K/uL 232   239        Latest Ref Rng & Units 09/10/2022    5:22 AM 09/09/2022    5:07 AM 09/09/2022    2:42 AM  BMP  Glucose 70 - 99 mg/dL 086   578   BUN 6 - 20 mg/dL 24   20   Creatinine 4.69 - 1.00 mg/dL 6.29   5.28   Sodium 413 - 145 mmol/L 139  142  138   Potassium 3.5 - 5.1 mmol/L 2.9  3.7  3.9   Chloride 98 - 111 mmol/L 106   102    CO2 22 - 32 mmol/L 24   16   Calcium 8.9 - 10.3 mg/dL 8.8   9.0      Imaging Reviewed:  CT head 8/11: No acute abnormality  MRI brain 8/12: No acute or reversible finding, extensive chronic small vessel ischemia  EEG 8/12 to 8/13: Continuous slow generalized, excessive beta generalized, suggestive of severe diffuse encephalopathy related to sedation, no seizures or epileptiform discharges  Assessment: 57 year old patient with the above past medical history presented to the ED with seizure and confusion.  She did have a seizure while in the emergency department and was intubated for airway protection.  Given her hypertension upon arrival, there was concern for PRES, but MRI does not demonstrate evidence of this.  EEG shows no seizure activity overnight, and suspect that initial seizure was provoked by hypertensive emergency.  She has been normotensive today without Cleviprex.  However, patient is on a moderate dose of propofol which may be suppressing seizure activity.  Will maintain long-term EEG for 1 more day in hopes that propofol can be weaned to ensure that she has no seizure activity when propofol is  stopped.  Impression: Seizure provoked by hypertensive emergency  Recommendations: -Continue long-term EEG for 1 more day -Wean propofol as able -No need for AEDs at this time as patient's initial seizure appeared to be provoked by hypertensive emergency, and no further seizures seen on long-term EEG -Neurology will continue to follow  Cortney E Ernestina Columbia , MSN, AGACNP-BC [facility] See Amion for schedule and pager information 09/10/2022 8:44 AM   Attending Neurohospitalist Addendum Patient seen and examined with APP/Resident. Agree with the history and physical as documented above. Agree with the plan as documented, which I helped formulate. I have edited the note above to reflect my full findings and recommendations. I have independently reviewed the chart,  obtained history, review of systems and examined the patient.I have personally reviewed pertinent head/neck/spine imaging (CT/MRI). Please feel free to call with any questions.  This patient is critically ill and at significant risk of neurological worsening, death and care requires constant monitoring of vital signs, hemodynamics,respiratory and cardiac monitoring, neurological assessment, discussion with family, other specialists and medical decision making of high complexity. I spent 40 minutes of neurocritical care time  in the care of  this patient. This was time spent independent of any time provided by nurse practitioner or PA.  Bing Neighbors, MD [facility] [phone]  If 7pm- 7am, please page neurology on call as listed in AMION.

## 2022-09-11 DIAGNOSIS — R4182 Altered mental status, unspecified: Secondary | ICD-10-CM | POA: Diagnosis not present

## 2022-09-11 DIAGNOSIS — R569 Unspecified convulsions: Secondary | ICD-10-CM | POA: Diagnosis not present

## 2022-09-11 DIAGNOSIS — I161 Hypertensive emergency: Secondary | ICD-10-CM | POA: Diagnosis not present

## 2022-09-11 LAB — GLUCOSE, CAPILLARY
Glucose-Capillary: 125 mg/dL — ABNORMAL HIGH (ref 70–99)
Glucose-Capillary: 134 mg/dL — ABNORMAL HIGH (ref 70–99)
Glucose-Capillary: 139 mg/dL — ABNORMAL HIGH (ref 70–99)
Glucose-Capillary: 145 mg/dL — ABNORMAL HIGH (ref 70–99)
Glucose-Capillary: 149 mg/dL — ABNORMAL HIGH (ref 70–99)
Glucose-Capillary: 164 mg/dL — ABNORMAL HIGH (ref 70–99)

## 2022-09-11 MED ORDER — FOLIC ACID 1 MG PO TABS
1.0000 mg | ORAL_TABLET | Freq: Every day | ORAL | Status: DC
  Administered 2022-09-12 – 2022-09-13 (×2): 1 mg via ORAL
  Filled 2022-09-11 (×2): qty 1

## 2022-09-11 MED ORDER — LOSARTAN POTASSIUM 50 MG PO TABS
100.0000 mg | ORAL_TABLET | Freq: Every day | ORAL | Status: DC
  Administered 2022-09-12 – 2022-09-13 (×2): 100 mg via ORAL
  Filled 2022-09-11 (×2): qty 2

## 2022-09-11 MED ORDER — FOLIC ACID 1 MG PO TABS
1.0000 mg | ORAL_TABLET | Freq: Every day | ORAL | Status: DC
  Administered 2022-09-11: 1 mg
  Filled 2022-09-11: qty 1

## 2022-09-11 MED ORDER — POTASSIUM CHLORIDE 20 MEQ PO PACK
20.0000 meq | PACK | ORAL | Status: AC
  Administered 2022-09-11 (×2): 20 meq
  Filled 2022-09-11 (×2): qty 1

## 2022-09-11 MED ORDER — VITAMIN B-6 100 MG PO TABS
100.0000 mg | ORAL_TABLET | Freq: Every day | ORAL | Status: DC
  Administered 2022-09-12 – 2022-09-13 (×2): 100 mg via ORAL
  Filled 2022-09-11 (×2): qty 1

## 2022-09-11 MED ORDER — SPIRONOLACTONE 25 MG PO TABS
25.0000 mg | ORAL_TABLET | Freq: Every day | ORAL | Status: DC
  Administered 2022-09-11 – 2022-09-13 (×3): 25 mg via ORAL
  Filled 2022-09-11 (×3): qty 1

## 2022-09-11 MED ORDER — CLEVIDIPINE BUTYRATE 0.5 MG/ML IV EMUL
INTRAVENOUS | Status: AC
  Filled 2022-09-11: qty 100

## 2022-09-11 MED ORDER — DILTIAZEM HCL 30 MG PO TABS
30.0000 mg | ORAL_TABLET | Freq: Four times a day (QID) | ORAL | Status: DC
  Administered 2022-09-12 – 2022-09-13 (×8): 30 mg via ORAL
  Filled 2022-09-11 (×8): qty 1

## 2022-09-11 MED ORDER — ATORVASTATIN CALCIUM 40 MG PO TABS
40.0000 mg | ORAL_TABLET | Freq: Every day | ORAL | Status: DC
  Administered 2022-09-12 – 2022-09-13 (×2): 40 mg via ORAL
  Filled 2022-09-11 (×2): qty 1

## 2022-09-11 MED ORDER — POLYETHYLENE GLYCOL 3350 17 G PO PACK: 17.0000 g | PACK | Freq: Every day | ORAL | Status: DC | PRN

## 2022-09-11 MED ORDER — ORAL CARE MOUTH RINSE: 15.0000 mL | OROMUCOSAL | Status: DC | PRN

## 2022-09-11 MED ORDER — HYDRALAZINE HCL 20 MG/ML IJ SOLN
10.0000 mg | INTRAMUSCULAR | Status: DC | PRN
  Administered 2022-09-11 – 2022-09-12 (×3): 10 mg via INTRAVENOUS
  Filled 2022-09-11 (×3): qty 1

## 2022-09-11 MED ORDER — CLEVIDIPINE BUTYRATE 0.5 MG/ML IV EMUL
0.0000 mg/h | INTRAVENOUS | Status: DC
  Administered 2022-09-11: 2 mg/h via INTRAVENOUS

## 2022-09-11 MED ORDER — FENTANYL CITRATE PF 50 MCG/ML IJ SOSY
25.0000 ug | PREFILLED_SYRINGE | INTRAMUSCULAR | Status: DC | PRN
  Administered 2022-09-11 – 2022-09-12 (×4): 25 ug via INTRAVENOUS
  Filled 2022-09-11 (×4): qty 1

## 2022-09-11 MED ORDER — ORAL CARE MOUTH RINSE
15.0000 mL | OROMUCOSAL | Status: DC
  Administered 2022-09-11 – 2022-09-13 (×6): 15 mL via OROMUCOSAL

## 2022-09-11 MED ORDER — ACETAMINOPHEN 325 MG PO TABS
650.0000 mg | ORAL_TABLET | ORAL | Status: DC | PRN
  Administered 2022-09-12 – 2022-09-13 (×4): 650 mg via ORAL
  Filled 2022-09-11 (×4): qty 2

## 2022-09-11 MED ORDER — FAMOTIDINE 20 MG PO TABS
20.0000 mg | ORAL_TABLET | Freq: Every day | ORAL | Status: DC
  Administered 2022-09-12: 20 mg via ORAL
  Filled 2022-09-11: qty 1

## 2022-09-11 MED ORDER — DOCUSATE SODIUM 50 MG/5ML PO LIQD: 100.0000 mg | Freq: Two times a day (BID) | ORAL | Status: DC | PRN

## 2022-09-11 MED ORDER — POTASSIUM CHLORIDE 10 MEQ/100ML IV SOLN
10.0000 meq | INTRAVENOUS | Status: AC
  Administered 2022-09-11 (×4): 10 meq via INTRAVENOUS
  Filled 2022-09-11 (×4): qty 100

## 2022-09-11 MED ORDER — CARVEDILOL 3.125 MG PO TABS: 6.2500 mg | ORAL_TABLET | Freq: Two times a day (BID) | ORAL | Status: DC

## 2022-09-11 MED ORDER — CARVEDILOL 3.125 MG PO TABS
6.2500 mg | ORAL_TABLET | Freq: Two times a day (BID) | ORAL | Status: DC
  Administered 2022-09-11: 6.25 mg via ORAL
  Filled 2022-09-11: qty 2

## 2022-09-11 NOTE — Progress Notes (Signed)
Neurology Progress Note  Brief HPI: 57 year old patient with history of alcoholism and recent mission, polysubstance abuse, CKD, COPD, CHF, hypertension, hyperlipidemia obstructive sleep apnea and polyneuropathy presented to the ED after having a seizure.  She was found by her family in bed confused with urinary incontinence and brought to the ED.  While in the ED, she had a seizure and was treated with Ativan and then intubated for airway protection.  She was loaded on Keppra and started on thiamine.  She was quite hypertensive on arrival to the ED, and hypertensive emergency likely provoked the seizure.  MRI brain was negative for acute abnormality  Subjective: Patient is seen and examined in her room, she is on propofol but this was paused for exam.  Nursing is titrating down propofol as able Currently on full support on ventilator  Exam: Vitals:   09/11/22 0500 09/11/22 0600  BP: (!) 148/89 (!) 144/81  Pulse: 95 85  Resp: 20 (!) 21  Temp:    SpO2: 96% 94%   Gen: In bed, NAD Resp: Respirations synchronous with ventilator Abd: soft, nt  Neuro (sedation with propofol): Mental Status: Patient does opens eyes but does not follow commands.  She will localize to noxious stimuli in all extremities.  Pupils equal, round and reactive to light, oculocephalic reflex present, cough/gag intact.  Pertinent Labs:    Latest Ref Rng & Units 09/11/2022    6:01 AM 09/10/2022    5:22 AM 09/09/2022    5:07 AM  CBC  WBC 4.0 - 10.5 K/uL 11.2  11.4    Hemoglobin 12.0 - 15.0 g/dL 65.7  84.6  96.2   Hematocrit 36.0 - 46.0 % 48.7  49.8  56.0   Platelets 150 - 400 K/uL 201  232         Latest Ref Rng & Units 09/11/2022    6:01 AM 09/10/2022    5:22 AM 09/09/2022    5:07 AM  BMP  Glucose 70 - 99 mg/dL 952  841    BUN 6 - 20 mg/dL 19  24    Creatinine 3.24 - 1.00 mg/dL 4.01  0.27    Sodium 253 - 145 mmol/L 140  139  142   Potassium 3.5 - 5.1 mmol/L 3.3  2.9  3.7   Chloride 98 - 111 mmol/L 106  106     CO2 22 - 32 mmol/L 21  24    Calcium 8.9 - 10.3 mg/dL 8.7  8.8       Imaging Reviewed:  CT head 8/11: No acute abnormality  MRI brain 8/12: No acute or reversible finding, extensive chronic small vessel ischemia  EEG 8/12 to 8/13: Continuous slow generalized, excessive beta generalized, suggestive of severe diffuse encephalopathy related to sedation, no seizures or epileptiform discharges  Assessment: 57 year old patient with the above past medical history presented to the ED with seizure and confusion.  She did have a seizure while in the emergency department and was intubated for airway protection.  Given her hypertension upon arrival, there was concern for PRES, but MRI does not demonstrate evidence of this.  EEG shows no seizure activity overnight, and suspect that initial seizure was provoked by hypertensive emergency.  She has been normotensive today without Cleviprex.  However, patient is on a moderate dose of propofol which may be suppressing seizure activity.  Will maintain long-term EEG for 1 more day in hopes that propofol can be weaned to ensure that she has no seizure activity when propofol is stopped.  Impression: Seizure provoked by hypertensive emergency  Recommendations: -Wean propofol as able - Extubate per CCM -No need for AEDs at this time as patient's initial seizure appeared to be provoked by hypertensive emergency, and no further seizures seen on long-term EEG -Neurology will continue to follow  Patient seen and examined by NP/APP with MD. MD to update note as needed.   Elmer Picker, DNP, FNP-BC [facility] Pager: [phone]   Attending Neurohospitalist Addendum Patient seen and examined with APP/Resident. Agree with the history and physical as documented above. Agree with the plan as documented, which I helped formulate. I have edited the note above to reflect my full findings and recommendations. I have independently reviewed the chart,  obtained history, review of systems and examined the patient.I have personally reviewed pertinent head/neck/spine imaging (CT/MRI). Please feel free to call with any questions.  This patient is critically ill and at significant risk of neurological worsening, death and care requires constant monitoring of vital signs, hemodynamics,respiratory and cardiac monitoring, neurological assessment, discussion with family, other specialists and medical decision making of high complexity. I spent 40 minutes of neurocritical care time  in the care of  this patient. This was time spent independent of any time provided by nurse practitioner or PA.  Bing Neighbors, MD [facility] [phone]  If 7pm- 7am, please page neurology on call as listed in AMION.

## 2022-09-11 NOTE — Procedures (Addendum)
Patient Name: [name]  MRN: [id number]  Epilepsy Attending: Charlsie Quest  Referring Physician/Provider: Marjorie Smolder, NP  Duration: 09/10/2022 1014 to 09/11/2022 0746   Patient history: 57 y.o. female with a PMHx of alcoholism in remission (as of 11/26/21 and family endorses only occasional EtOH socially a few times per year, polysubstance abuse (prior cocaine use but clear for the past few months, chronic opioid use, lumbar radiculopathy, CKD4, COPD, CHF, HTN, HLD, OSA and polyneuropathy who presents to the ED after she had a seizure. EEG to evaluate for seizure.   Level of alertness: comatose   AEDs during EEG study: Propofol   Technical aspects: This EEG study was done with scalp electrodes positioned according to the 10-20 International system of electrode placement. Electrical activity was reviewed with band pass filter of 1-70Hz , sensitivity of 7 uV/mm, display speed of 52mm/sec with a 60Hz  notched filter applied as appropriate. EEG data were recorded continuously and digitally stored.  Video monitoring was available and reviewed as appropriate.   Description: EEG showed continuous generalized 3 to 6 Hz theta-delta slowing admixed with an excessive amount of 15 to 18 Hz beta activity distributed symmetrically and diffusely. Hyperventilation and photic stimulation were not performed.      ABNORMALITY - Continuous slow, generalized - Excessive beta, generalized   IMPRESSION: This study is suggestive of severe diffuse encephalopathy likely related to sedation. No seizures or epileptiform discharges were seen throughout the recording.    Annabelle Harman

## 2022-09-11 NOTE — Progress Notes (Signed)
vLTM discontinued  No skin breakdown noted at all skin sites  [facility] notified

## 2022-09-11 NOTE — Evaluation (Signed)
Clinical/Bedside Swallow Evaluation Patient Details  Name: [name] MRN: [id number] Date of Birth: 12-19-1965  Today's Date: 09/11/2022 Time: SLP Start Time (ACUTE ONLY): 1710 SLP Stop Time (ACUTE ONLY): 1725 SLP Time Calculation (min) (ACUTE ONLY): 15 min  Past Medical History:  Past Medical History:  Diagnosis Date   Alcoholism in remission (HCC)    11-26-2021  pt stated no alcohol few yrs ago   Asthma    Bipolar disorder (HCC)    Chronic diastolic CHF (congestive heart failure) (HCC) 2014   followed by dr b. Cristal Deer;   Chronic hypoxemic respiratory failure [facility])    pulmonologist--- dr Tonia Brooms;   (11-26-2021 per pt was on supplemental oxygen until she 57/ 2022 did not need it,  does not check O2 sats at home anymore.   CKD (chronic kidney disease), stage III [facility])    nephrologist--- dr Ronalee Belts   COPD (chronic obstructive pulmonary disease) (HCC)    11-26-2021 pt trying to quit smoking ,  started age 57   Depression    Diabetes mellitus type 2, diet-controlled (HCC)    followed by pcp   (11-26-2021  per pt last A1c by pcp on 10-30-2021 7.8,  stated does not check blood sugar)   Dyspnea    Fibrocystic breast disease    GAD (generalized anxiety disorder)    GERD (gastroesophageal reflux disease)    Hepatitis B surface antigen positive 11/2017   pt stated asymptomatic ,  no treatment   History of cervical dysplasia 04/10/2011   '93- once dx.-got pregnant-no intervention, then postpartum, no dysplasia found   History of condyloma acuminatum 04/09/2011   Hyperlipidemia    Hypertension    Incomplete left bundle branch block (LBBB)    Lung nodule 11/2017   pulmonologist--- dr Tonia Brooms;  RUL and RLL   Muscle spasm of both lower legs    chronic , due to back issues,   OA (osteoarthritis) 04/10/2011   hips, shoulders, back   OSA (obstructive sleep apnea) 2009   study on epic 09-18-2007 mild osa w/ hypoxmia ;   (11-26-2021 per pt last used cpap 01/ 2022 before bariatric  surgery 04/ 2022 stated lost wt / and stopped needing supplemental oxygen , did not need anymore)   Polyneuropathy    due to back issues and DM   Polysubstance abuse (HCC)    11-26-2021  pt hx cocaine use stated none for yrs but ED visit 08-14-2021  positive UDS,  pt stated it was once only for depression none since   PSVT (paroxysmal supraventricular tachycardia)    followed by cardiology   Spinal stenosis, unspecified spinal region    Urge incontinence of urine    Wears dentures    upper   Past Surgical History:  Past Surgical History:  Procedure Laterality Date   ANAL FISTULECTOMY  04/17/2011   Procedure: FISTULECTOMY ANAL;  Surgeon: Wilmon Arms. Corliss Skains, MD;  Location: [facility];  Service: General;  Laterality: N/A;  Excision of Condyloma Gluteal Cleft    ANAL RECTAL MANOMETRY N/A 03/26/2019   Procedure: ANO RECTAL MANOMETRY;  Surgeon: Tressia Danas, MD;  [facility]: [facility];  Service: Gastroenterology;  Laterality: N/A;   BIOPSY  03/09/2019   Procedure: BIOPSY;  Surgeon: Tressia Danas, MD;  [facility]: [facility];  Service: Gastroenterology;;   BOTOX INJECTION N/A 12/29/2018   Procedure: BOTOX INJECTION(100 UNITS)  WITH CYSTOSCOPY;  Surgeon: Rene Paci, MD;  Location: [facility];  Service: Urology;  Laterality: N/A;   BOTOX INJECTION N/A 07/27/2019  Procedure: BOTOX INJECTION WITH CYSTOSCOPY;  Surgeon: Rene Paci, MD;  Location: [facility];  Service: Urology;  Laterality: N/A;  ONLY NEEDS 30 MIN   BOTOX INJECTION N/A 05/02/2021   Procedure: BOTOX INJECTION WITH CYSTOSCOPY;  Surgeon: Rene Paci, MD;  Location: [facility];  Service: Urology;  Laterality: N/A;  ONLY NEEDS 30 MIN   BOTOX INJECTION N/A 11/28/2021   Procedure: BOTOX INJECTION WITH CYSTOSCOPY, 200 UNITS;  Surgeon: Rene Paci, MD;  Location: [facility];  Service: Urology;  Laterality: N/A;  ONLY NEEDS 30 MIN   BOTOX INJECTION N/A  05/15/2022   Procedure: BOTOX INJECTION 200 UNITS;  Surgeon: Rene Paci, MD;  Location: [facility];  Service: Urology;  Laterality: N/A;   BREAST EXCISIONAL BIOPSY Right    x 3   BREAST EXCISIONAL BIOPSY Left    12-29-2000  and 11-30-2001 @[facility]  by dr Carolynne Edouard   CARPAL TUNNEL RELEASE Bilateral    right 08-30-2002 and left 07-04-2003  by dr Otelia Sergeant @ [facility]   CATARACT EXTRACTION W/ INTRAOCULAR LENS IMPLANT Bilateral 12/2020   CERVICAL FUSION  04/10/2011   x3- cervical fusion with plating and screws-Dr. Noel Gerold   COLONOSCOPY WITH PROPOFOL N/A 07/06/2015   Procedure: COLONOSCOPY WITH PROPOFOL;  Surgeon: Dorena Cookey, MD;  [facility];  Service: Endoscopy;  Laterality: N/A;   COLONOSCOPY WITH PROPOFOL N/A 03/09/2019   Procedure: COLONOSCOPY WITH PROPOFOL;  Surgeon: Tressia Danas, MD;  [facility];  Service: Gastroenterology;  Laterality: N/A;   CYSTOSCOPY N/A 05/15/2022   Procedure: CYSTOSCOPY;  Surgeon: Rene Paci, MD;  Location[facility];  Service: Urology;  Laterality: N/A;   FRACTURE SURGERY     little toe left foot   I & D EXTREMITY Left 12/18/2016   Procedure: IRRIGATION AND DEBRIDEMENT LEFT FOOT, CLOSURE;  Surgeon: Tarry Kos, MD;  Location: [facility];  Service: Orthopedics;  Laterality: Left;   INCISION AND DRAINAGE ABSCESS Right 05/03/2002   @[facility]  by dr Carolynne Edouard;   right breast abscess   INJECTION KNEE Right 10/03/2020   Procedure: KNEE INJECTION;  Surgeon: Kerrin Champagne, MD;  Location: [facility] OR;  Service: Orthopedics;  Laterality: Right;   KNEE ARTHROSCOPY WITH MEDIAL MENISECTOMY Right 03/21/2021   Procedure: RIGHT KNEE ARTHROSCOPY WITH MEDIAL AND PARTIAL LATERAL MENISCECTOMY;  Surgeon: Tarry Kos, MD;  Location: [facility];  Service: Orthopedics;  Laterality: Right;   LAPAROSCOPIC GASTRIC SLEEVE RESECTION N/A 05/08/2020   Procedure: LAPAROSCOPIC GASTRIC SLEEVE RESECTION;  Surgeon: Sheliah Hatch De Blanch,  MD;  [facility]: [facility];  Service: General;  Laterality: N/A;   LUMBAR DISC SURGERY  04/10/2011   x5-Lumbar fusion-retained hardware.(Dr. Otelia Sergeant)   LUMBAR LAMINECTOMY N/A 10/03/2020   Procedure: THORACOLUMBAR LAMINECTOMIES THORACIC ELEVEN-TWELVE, THORACIC TWELVE-LUMBAR ONE  AND LUMBAT ONE-TWO;  Surgeon: Kerrin Champagne, MD;  Location: [facility];  Service: Orthopedics;  Laterality: N/A;   LUMBAR PERCUTANEOUS PEDICLE SCREW 2 LEVEL  06/29/2021   Procedure: LUMBAR PERCUTANEOUS PEDICLE SCREW LUMBAR ONE-TWO;  Surgeon: Julio Sicks, MD;  Location: [facility];  Service: Neurosurgery;;   MULTIPLE TOOTH EXTRACTIONS     POLYPECTOMY  03/09/2019   Procedure: POLYPECTOMY;  Surgeon: Tressia Danas, MD;  [facility];  Service: Gastroenterology;;   POSTERIOR LUMBAR FUSION  04/04/2005   @[facility]  by dr Otelia Sergeant;   L3-- L5   POSTERIOR LUMBAR FUSION  03/04/2006   @[facility]  dr Otelia Sergeant;   fusion L2--3 and redo L3--5   SHOULDER ARTHROSCOPY WITH  ROTATOR CUFF REPAIR AND SUBACROMIAL DECOMPRESSION Left 05/07/2019   Procedure: LEFT SHOULDER ARTHROSCOPY WITH DEBRIDEMENT, DISTAL CLAVICLE EXCISION,  SUBACROMIAL DECOMPRESSION AND POSSIBLE ROTATOR CUFF REPAIR;  Surgeon: Tarry Kos, MD;  Location: [facility];  Service: Orthopedics;  Laterality: Left;   TEE WITHOUT CARDIOVERSION N/A 04/06/2012   Procedure: TRANSESOPHAGEAL ECHOCARDIOGRAM (TEE);  Surgeon: Chrystie Nose, MD;  Location: [facility] END[facility];  Service: Cardiovascular;  Laterality: N/A;   TRIGGER FINGER RELEASE Right    middle finger   TRIGGER FINGER RELEASE Left 06/28/2016   Procedure: RELEASE TRIGGER FINGER LEFT 3RD FINGER;  Surgeon: Tarry Kos, MD;  [facility];  Service: Orthopedics;  Laterality: Left;   TRIGGER FINGER RELEASE Right 06/12/2016   Procedure: RIGHT INDEX FINGER TRIGGER RELEASE;  Surgeon: Tarry Kos, MD;  [facility];  Service: Orthopedics;  Laterality: Right;   TRIGGER FINGER RELEASE Right 01/19/2018   Procedure: RIGHT RING FINGER TRIGGER FINGER RELEASE;   Surgeon: Tarry Kos, MD;  Location: [facility];  Service: Orthopedics;  Laterality: Right;   TRIGGER FINGER RELEASE Left 05/07/2019   Procedure: RELEASE TRIGGER FINGER LEFT INDEX FINGER;  Surgeon: Tarry Kos, MD;  [facility];  Service: Orthopedics;  Laterality: Left;   UPPER GI ENDOSCOPY N/A 05/08/2020   Procedure: UPPER GI ENDOSCOPY;  Surgeon: Sheliah Hatch De Blanch, MD;  [facility];  Service: General;  Laterality: N/A;   HPI:  Patient is a 57 y.o. female with PMH: OHS/OSA, T2DM, CKD 4, COPD, diastolic CHF, alcoholism in remission, polysubstance abuse, lumbar radiculopathy chronic opioid use, bipolar, depression, HTN, HLD presents to [facility] on 8/11 w/ seizure. In ED she was lethargic, but able to wake up, very confused. CT head negative for acute abnormality. MRI was also negative for acute findings. While in ED, she had a seizure requiring Ativan and intubation. She was ultimately extubated to oxygen via nasal cannula on 09/11/22 at 1112. She failed Yale and is NPO awaiting SLP swallow evaluation.    Assessment / Plan / Recommendation  Clinical Impression  Patient presents with clinical  s/s of what appears to be a post-extubation dysphagia. Her voice was clear and strong but hoarse and she had a hoarse sounding cough. Cortrak feeding tube was in place during this evaluation and patient did comment on feeling it. In addition, when she swallowed, it seemed a little more effortful, likely from Cortrak tube. With initial sips of thin liquids (water), patient exhibited immediate cough and suspected swallow initiation delay. Subsequent sips from straw did not result in any overt s/s aspiration, vitals and voice remained WFL. SLP recommending start with full liquids/thin consistency and will f/u next date for trial of solids. SLP Visit Diagnosis: Dysphagia, unspecified (R13.10)    Aspiration Risk  Mild aspiration risk    Diet Recommendation Thin liquid    Liquid Administration via:  Cup;Straw Medication Administration: Crushed with puree Supervision: Staff to assist with self feeding;Full supervision/cueing for compensatory strategies Compensations: Slow rate;Small sips/bites;Minimize environmental distractions Postural Changes: Seated upright at 90 degrees    Other  Recommendations Oral Care Recommendations: Oral care BID;Staff/trained caregiver to provide oral care    Recommendations for follow up therapy are one component of a multi-disciplinary discharge planning process, led by the attending physician.  Recommendations may be updated based on patient status, additional functional criteria and insurance authorization.  Follow up Recommendations Other (comment) (TBD)      Assistance Recommended at Discharge    Functional Status Assessment Patient has had a recent  decline in their functional status and demonstrates the ability to make significant improvements in function in a reasonable and predictable amount of time.  Frequency and Duration min 2x/week  1 week       Prognosis Prognosis for improved oropharyngeal function: Good      Swallow Study   General Date of Onset: 09/08/22 HPI: Patient is a 57 y.o. female with PMH: OHS/OSA, T2DM, CKD 4, COPD, diastolic CHF, alcoholism in remission, polysubstance abuse, lumbar radiculopathy chronic opioid use, bipolar, depression, HTN, HLD presents to [facility] on 8/11 w/ seizure. In ED she was lethargic, but able to wake up, very confused. CT head negative for acute abnormality. MRI was also negative for acute findings. While in ED, she had a seizure requiring Ativan and intubation. She was ultimately extubated to oxygen via nasal cannula on 09/11/22 at 1112. She failed Yale and is NPO awaiting SLP swallow evaluation. Type of Study: Bedside Swallow Evaluation Previous Swallow Assessment: none found Diet Prior to this Study: NPO Temperature Spikes Noted: No Respiratory Status: Nasal cannula History of Recent Intubation:  Yes Total duration of intubation (days): 4 days Date extubated: 09/11/22 Behavior/Cognition: Alert;Cooperative;Pleasant mood;Confused Oral Cavity Assessment: Within Functional Limits Oral Care Completed by SLP: Recent completion by staff Oral Cavity - Dentition: Edentulous Vision: Functional for self-feeding Self-Feeding Abilities: Needs set up;Needs assist Patient Positioning: Upright in bed Baseline Vocal Quality: Hoarse Volitional Cough: Strong Volitional Swallow: Able to elicit    Oral/Motor/Sensory Function Overall Oral Motor/Sensory Function: Within functional limits   Ice Chips     Thin Liquid Thin Liquid: Impaired Presentation: Straw;Cup Pharyngeal  Phase Impairments: Cough - Immediate;Suspected delayed Swallow    Nectar Thick     Honey Thick     Puree Puree: Within functional limits Presentation: Spoon   Solid     Solid: Not tested      Angela Nevin, MA, CCC-SLP Speech Therapy

## 2022-09-11 NOTE — Plan of Care (Signed)
Not progressing 

## 2022-09-11 NOTE — Progress Notes (Signed)
NAME:  [name], MRN:  [id number], DOB:  [birth year], LOS: 2 ADMISSION DATE:  09/08/2022, CONSULTATION DATE:  8/12 REFERRING MD:  Dr Elliot Gurney, CHIEF COMPLAINT:  seizure   History of Present Illness:  Patient is a 57 year old female with pertinent PMH OHS/OSA, T2DM, CKD 4, COPD, diastolic CHF, alcoholism in remission, polysubstance abuse, lumbar radiculopathy chronic opioid use, bipolar, depression, HTN, HLD presents to [facility] on 8/11 w/ seizure.  On 8/11, patient send in to check on mother found her naked lying in bed talking delusional.  Bed noted to be soaked in urine.  EMS called for AMS the patient brought to [facility] ED. LKN 8/10 9 am.   Per son patient only drinks alcohol socially a few times a year and has had a previous history of cocaine use but has been clean for the past few months. Patient recently started on 0.1 clonidine bid to prevent opioid withdrawal.   Upon arrival patient lethargic but able to wake up but very confused.  Afebrile and hypertensive 189/121. Cbg 115. Ethanol, acetaminophen/salicylate level, UDS, ck, ammonia WNL. TSH 0.274 and t4 pending. CT head no acute abnormality. Patient given labetalol for htn. Patient admitted to family medicine service. MRI ordered to rule out pres and home meds gabapentin/clonidine held. HIV, RPR, B12, folate pending. While in ED patient had seizure episode requiring ativan and intubation for airway protection. Loaded w/ keppra. PCCM and neuro consulted.   Pertinent  Medical History   Past Medical History:  Diagnosis Date   Alcoholism in remission (HCC)    11-26-2021  pt stated no alcohol few yrs ago   Asthma    Bipolar disorder (HCC)    Chronic diastolic CHF (congestive heart failure) (HCC) 2014   followed by dr b. Cristal Deer;   Chronic hypoxemic respiratory failure [facility])    pulmonologist--- dr Tonia Brooms;   (11-26-2021 per pt was on supplemental oxygen until she 01/ 2022 did not need it,  does not check O2 sats at home anymore.   CKD  (chronic kidney disease), stage III [facility])    nephrologist--- dr Ronalee Belts   COPD (chronic obstructive pulmonary disease) (HCC)    11-26-2021 pt trying to quit smoking ,  started age 47   Depression    Diabetes mellitus type 2, diet-controlled (HCC)    followed by pcp   (11-26-2021  per pt last A1c by pcp on 10-30-2021 7.8,  stated does not check blood sugar)   Dyspnea    Fibrocystic breast disease    GAD (generalized anxiety disorder)    GERD (gastroesophageal reflux disease)    Hepatitis B surface antigen positive 11/2017   pt stated asymptomatic ,  no treatment   History of cervical dysplasia 04/10/2011   '93- once dx.-got pregnant-no intervention, then postpartum, no dysplasia found   History of condyloma acuminatum 04/09/2011   Hyperlipidemia    Hypertension    Incomplete left bundle branch block (LBBB)    Lung nodule 11/2017   pulmonologist--- dr Tonia Brooms;  RUL and RLL   Muscle spasm of both lower legs    chronic , due to back issues,   OA (osteoarthritis) 04/10/2011   hips, shoulders, back   OSA (obstructive sleep apnea) 2009   study on epic 09-18-2007 mild osa w/ hypoxmia ;   (11-26-2021 per pt last used cpap 01/ 2022 before bariatric surgery 04/ 2022 stated lost wt / and stopped needing supplemental oxygen , did not need anymore)   Polyneuropathy    due to back issues  and DM   Polysubstance abuse (HCC)    11-26-2021  pt hx cocaine use stated none for yrs but ED visit 08-14-2021  positive UDS,  pt stated it was once only for depression none since   PSVT (paroxysmal supraventricular tachycardia)    followed by cardiology   Spinal stenosis, unspecified spinal region    Urge incontinence of urine    Wears dentures    upper     Significant Hospital Events: Including procedures, antibiotic start and stop dates in addition to other pertinent events   8/12 admitted w/ AMS; intially concerned PRES; patient had seizure episode and intubated 8/13 No seizures seen on EEG. LTM  stopped. Weaned off Cleviprex.  Interim History / Subjective:   Slowly waking up but not yet following commands.    Objective   Blood pressure (!) 144/81, pulse 85, temperature 99.6 F (37.6 C), temperature source Axillary, resp. rate (!) 21, height 5\' 9"  (1.753 m), weight 103.6 kg, last menstrual period 01/29/1992, SpO2 95%.    Vent Mode: PRVC FiO2 (%):  [40 %] 40 % Set Rate:  [20 bmp] 20 bmp Vt Set:  [520 mL] 520 mL PEEP:  [5 cmH20] 5 cmH20 Pressure Support:  [8 cmH20] 8 cmH20 Plateau Pressure:  [16 cmH20-17 cmH20] 16 cmH20   Intake/Output Summary (Last 24 hours) at 09/11/2022 0901 Last data filed at 09/11/2022 1610 Gross per 24 hour  Intake 1724.82 ml  Output 1725 ml  Net -0.18 ml   Filed Weights   09/08/22 2159 09/10/22 0348  Weight: 105.1 kg 103.6 kg    Examination: General: critically ill appearing on mech vent HEENT: MM pink/moist; ETT in place Neuro: on minimal sedation. Moves all limbs but not yet following commands.  CV: s1s2, RRR, no m/r/g PULM:  dim clear BS bilaterally; respiratory effort inconsistent on PSV GI: soft, bsx4 active  Extremities: warm/dry, no edema  Skin: no rashes or lesions    Ancillary Tests:  MRI shows no evidence of PRES but demonstrates chronic microvascular disease. K: 3.3 HB: 16.5 Cr: 0.87  Assessment & Plan:   Acute metabolic encephalopathy due to polypharmacy and possible hypertensive encephalopathy. Concern for seizure but no with no EEG correlate. Hypoxic respiratory failure with history of OHS and COPD but primarily intubated for airway protection. HTN emergency now off Cleviprex Hx of diastolic CHF Hx of HTN/HLD AKI on CKD 4 T2dm Leukocytosis Low TSH Hx of alcoholism: in remission since 2023; per son only drinks 1-2 times per year Hx of polysubstance abuse: cocaine but has been clear for 2 months lumbar radiculopathy w/ chronic opioid use Bipolar Depression  Plan: -Stop sedation and allow patient to wake  up. -SBT and extubate as appropriate. -BP well controlled on current medications. -Keep off psychotropic medications for now and reintroduce sequentially once mental status has improved and patient is awake.  Best Practice (right click and "Reselect all SmartList Selections" daily)   Diet/type: NPO w/ meds via tube DVT prophylaxis: prophylactic heparin  GI prophylaxis: H2B Lines: N/A Foley:  N/A Code Status:  full code Last date of multidisciplinary goals of care discussion [8/12 updated son dominique over phone]  CRITICAL CARE Performed by: Lynnell Catalan   Total critical care time: 35 minutes  Critical care time was exclusive of separately billable procedures and treating other patients.  Critical care was necessary to treat or prevent imminent or life-threatening deterioration.  Critical care was time spent personally by me on the following activities: development of treatment plan with patient  and/or surrogate as well as nursing, discussions with consultants, evaluation of patient's response to treatment, examination of patient, obtaining history from patient or surrogate, ordering and performing treatments and interventions, ordering and review of laboratory studies, ordering and review of radiographic studies, pulse oximetry, re-evaluation of patient's condition and participation in multidisciplinary rounds.  Lynnell Catalan, MD [facility] ICU Physician [facility]  Pager: [phone] Mobile: [phone] After hours: [phone].

## 2022-09-11 NOTE — Plan of Care (Signed)
  Problem: Safety: Goal: Ability to remain free from injury will improve Outcome: Progressing   Problem: Skin Integrity: Goal: Risk for impaired skin integrity will decrease Outcome: Progressing   Problem: Safety: Goal: Non-violent Restraint(s) Outcome: Completed/Met

## 2022-09-11 NOTE — Progress Notes (Signed)
LTM maint complete - skin breakdown /irritation under: Fp1, Fp2 Ground, nurse notified [facility] monitored, Event button test confirmed by [facility].

## 2022-09-11 NOTE — Procedures (Signed)
Extubation Procedure Note  Patient Details:   Name: [name] DOB: 1965-10-09 MRN: [id number]   Airway Documentation:    Vent end date: 09/11/22 Vent end time: 1112   Evaluation  O2 sats: stable throughout Complications: No apparent complications Patient did tolerate procedure well. Bilateral Breath Sounds: Rhonchi, Diminished   Yes  Pt extubated per MD order. Placed on 4L [place]. Positive cuff leak noted, no stridor heard. RT will continue to monitor.   Vicente Masson 09/11/2022, 11:17 AM

## 2022-09-11 NOTE — Progress Notes (Signed)
Pharmacy Electrolyte Replacement  Recent Labs:  Recent Labs    09/11/22 0601  K 3.3*  MG 2.1  PHOS 3.0  CREATININE 0.87    Low Critical Values (K </= 2.5, Phos </= 1, Mg </= 1) Present: None  Plan: Supplement per protocol with Kcl. Recheck in AM   Estill Batten, PharmD, BCCCP

## 2022-09-12 DIAGNOSIS — I5032 Chronic diastolic (congestive) heart failure: Secondary | ICD-10-CM | POA: Diagnosis not present

## 2022-09-12 DIAGNOSIS — J449 Chronic obstructive pulmonary disease, unspecified: Secondary | ICD-10-CM | POA: Diagnosis not present

## 2022-09-12 DIAGNOSIS — I10 Essential (primary) hypertension: Secondary | ICD-10-CM

## 2022-09-12 LAB — BASIC METABOLIC PANEL
Anion gap: 9 (ref 5–15)
BUN: 15 mg/dL (ref 6–20)
CO2: 26 mmol/L (ref 22–32)
Calcium: 9.2 mg/dL (ref 8.9–10.3)
Chloride: 107 mmol/L (ref 98–111)
Creatinine, Ser: 0.59 mg/dL (ref 0.44–1.00)
GFR, Estimated: >60 mL/min (ref >60)
Glucose, Bld: 133 mg/dL — ABNORMAL HIGH (ref 70–99)
Potassium: 3.4 mmol/L — ABNORMAL LOW (ref 3.5–5.1)
Sodium: 142 mmol/L (ref 135–145)

## 2022-09-12 LAB — GLUCOSE, CAPILLARY
Glucose-Capillary: 152 mg/dL — ABNORMAL HIGH (ref 70–99)
Glucose-Capillary: 144 mg/dL — ABNORMAL HIGH (ref 70–99)
Glucose-Capillary: 127 mg/dL — ABNORMAL HIGH (ref 70–99)
Glucose-Capillary: 139 mg/dL — ABNORMAL HIGH (ref 70–99)
Glucose-Capillary: 173 mg/dL — ABNORMAL HIGH (ref 70–99)

## 2022-09-12 MED ORDER — OSMOLITE 1.5 CAL PO LIQD
780.0000 mL | ORAL | Status: DC
  Administered 2022-09-12: 780 mL
  Filled 2022-09-12: qty 948
  Filled 2022-09-12: qty 1000
  Filled 2022-09-12: qty 948
  Filled 2022-09-12 (×2): qty 1000

## 2022-09-12 MED ORDER — THIAMINE MONONITRATE 100 MG PO TABS
100.0000 mg | ORAL_TABLET | Freq: Every day | ORAL | Status: DC
  Administered 2022-09-13: 100 mg via ORAL
  Filled 2022-09-12: qty 1

## 2022-09-12 MED ORDER — CARVEDILOL 12.5 MG PO TABS
12.5000 mg | ORAL_TABLET | Freq: Two times a day (BID) | ORAL | Status: DC
  Administered 2022-09-12 – 2022-09-13 (×4): 12.5 mg via ORAL
  Filled 2022-09-12 (×4): qty 1

## 2022-09-12 MED ORDER — METHOCARBAMOL 500 MG PO TABS
500.0000 mg | ORAL_TABLET | Freq: Three times a day (TID) | ORAL | Status: DC | PRN
  Administered 2022-09-12 – 2022-09-13 (×3): 500 mg via ORAL
  Filled 2022-09-12 (×3): qty 1

## 2022-09-12 MED ORDER — CARVEDILOL 12.5 MG PO TABS: 12.5000 mg | ORAL_TABLET | Freq: Two times a day (BID) | ORAL | Status: DC

## 2022-09-12 MED ORDER — POTASSIUM CHLORIDE 20 MEQ PO PACK
40.0000 meq | PACK | Freq: Once | ORAL | Status: AC
  Administered 2022-09-12: 40 meq via ORAL
  Filled 2022-09-12: qty 2

## 2022-09-12 MED ORDER — LIDOCAINE 5 % EX PTCH
1.0000 | MEDICATED_PATCH | CUTANEOUS | Status: DC
  Administered 2022-09-12 – 2022-09-13 (×2): 1 via TRANSDERMAL
  Filled 2022-09-12 (×3): qty 1

## 2022-09-12 MED ORDER — MONTELUKAST SODIUM 10 MG PO TABS
10.0000 mg | ORAL_TABLET | Freq: Every day | ORAL | Status: DC
  Administered 2022-09-12: 10 mg via ORAL
  Filled 2022-09-12 (×2): qty 1

## 2022-09-12 MED ORDER — ENSURE ENLIVE PO LIQD
237.0000 mL | Freq: Two times a day (BID) | ORAL | Status: DC
  Administered 2022-09-13 (×2): 237 mL via ORAL

## 2022-09-12 MED ORDER — CLONIDINE HCL 0.1 MG PO TABS
0.2000 mg | ORAL_TABLET | Freq: Two times a day (BID) | ORAL | Status: DC
  Administered 2022-09-12 – 2022-09-13 (×3): 0.2 mg via ORAL
  Filled 2022-09-12: qty 1
  Filled 2022-09-12 (×2): qty 2

## 2022-09-12 MED ORDER — PANTOPRAZOLE SODIUM 40 MG PO TBEC: 40.0000 mg | DELAYED_RELEASE_TABLET | Freq: Every day | ORAL | Status: DC

## 2022-09-12 MED ORDER — HYDRALAZINE HCL 20 MG/ML IJ SOLN: 20.0000 mg | INTRAMUSCULAR | Status: DC | PRN

## 2022-09-12 MED ORDER — LABETALOL HCL 5 MG/ML IV SOLN
10.0000 mg | INTRAVENOUS | Status: DC | PRN
  Administered 2022-09-12: 10 mg via INTRAVENOUS
  Filled 2022-09-12: qty 4

## 2022-09-12 MED ORDER — GABAPENTIN 100 MG PO CAPS
200.0000 mg | ORAL_CAPSULE | Freq: Three times a day (TID) | ORAL | Status: DC
  Administered 2022-09-12 – 2022-09-13 (×4): 200 mg via ORAL
  Filled 2022-09-12 (×4): qty 2

## 2022-09-12 NOTE — Progress Notes (Signed)
Patient transferred from 4N24 to [phone] on telemetry without complication. 3W staff in room upon arrival

## 2022-09-12 NOTE — Plan of Care (Signed)
Neurology plan of care  Please see most recent neurology progress note from 8/14. Yesterday sedation was weaned and patient was successfully extubated. LTM EEG discontinued. BP well-controlled on current regimen.  No need for AEDs at this time bc patient's initial seizure was provoked in the setting of hypertensive emergency and she had no subsequent seizures seen on continuous EEG.  Neurology will not continue to actively follow, but please re-engage if additional neurologic concerns arise.  Bing Neighbors, MD [facility] [phone]  If 7pm- 7am, please page neurology on call as listed in AMION.

## 2022-09-12 NOTE — Progress Notes (Signed)
Speech Language Pathology Treatment: Dysphagia  Patient Details Name: [name] MRN: [id number] DOB: 1965-02-04 Today's Date: 09/12/2022 Time: 4098-1191 SLP Time Calculation (min) (ACUTE ONLY): 13 min  Assessment / Plan / Recommendation Clinical Impression  Pt has back pain but was repositioned well into a more upright position and actually said that she was more comfortable once sitting up. She is missing dentition and says that she does typically wear her dentures while eating, but that they are currently at her home. She was still able to consume solids with appropriate oral preparation and clearance. A little eructation was observed but there were no overt s/s of aspiration or concerns for dysphagia. Discussed options for softer diets, but pt prefers to be on regular textures, selecting menu options that would be soft enough for her. RN also says he can call her son about possibly bringing her dentures. SLP will f/u at least briefly after transition to regular solids.    HPI HPI: Patient is a 57 y.o. female with PMH: OHS/OSA, T2DM, CKD 4, COPD, diastolic CHF, alcoholism in remission, polysubstance abuse, lumbar radiculopathy chronic opioid use, bipolar, depression, HTN, HLD presents to [facility] on 8/11 w/ seizure. In ED she was lethargic, but able to wake up, very confused. CT head negative for acute abnormality. MRI was also negative for acute findings. While in ED, she had a seizure requiring Ativan and intubation. She was ultimately extubated to oxygen via nasal cannula on 09/11/22 at 1112. She failed Yale and is NPO awaiting SLP swallow evaluation.      SLP Plan  Continue with current plan of care      Recommendations for follow up therapy are one component of a multi-disciplinary discharge planning process, led by the attending physician.  Recommendations may be updated based on patient status, additional functional criteria and insurance authorization.    Recommendations  Diet  recommendations: Regular;Thin liquid Liquids provided via: Cup;Straw Medication Administration: Whole meds with puree Supervision: Staff to assist with self feeding Compensations: Slow rate;Small sips/bites;Minimize environmental distractions Postural Changes and/or Swallow Maneuvers: Seated upright 90 degrees                  Oral care BID;Staff/trained caregiver to provide oral care   PRN Dysphagia, unspecified (R13.10)     Continue with current plan of care     Mahala Menghini., M.A. CCC-SLP Acute Rehabilitation Services Office [phone]  Secure chat preferred   09/12/2022, 10:14 AM

## 2022-09-12 NOTE — Plan of Care (Signed)
  Problem: Clinical Measurements: Goal: Diagnostic test results will improve Outcome: Progressing Goal: Respiratory complications will improve Outcome: Progressing   Problem: Activity: Goal: Risk for activity intolerance will decrease Outcome: Progressing   Problem: Nutrition: Goal: Adequate nutrition will be maintained Outcome: Progressing   

## 2022-09-12 NOTE — Progress Notes (Signed)
Nutrition Follow-up  DOCUMENTATION CODES:   Non-severe (moderate) malnutrition in context of chronic illness (likely social-environmental component as well)  INTERVENTION:   Liberalize diet to Carbohydrate Modified, encouraged high protein foods  Ensure Enlive po BID, each supplement provides 350 kcal and 20 grams of protein.  Nocturnal TF:  Osmolite 1.5 @ 65 ml/hr x 12 hours Provides: 1250 kcal, 68 grams protein, and 592 ml free water   NUTRITION DIAGNOSIS:   Moderate Malnutrition related to chronic illness, social / environmental circumstances as evidenced by mild fat depletion, mild muscle depletion, moderate muscle depletion. Ongoing  GOAL:   Patient will meet greater than or equal to 90% of their needs Progressing.   MONITOR:   PO intake, Supplement acceptance, TF tolerance  REASON FOR ASSESSMENT:   Ventilator    ASSESSMENT:   57 yo female admitted with AMS with +seizure while in ED and intubated for airway protection AKI on CKD. PMH includes DM, CKD 4, COPD, diastolic CHF, EtOH in remission, polysubstance abuse, gastric sleeve in 2022  Pt discussed during ICU rounds and with RN and NP.   Spoke with pt who reports decreased appetite. SLP worked with pt and adv diet. Pt does not have her dentures in the hospital but she prefers regular texture foods. Pt likes seafood, chicken wings, and ground [place]. She also eats fruits and vegetables. She no longer takes a protein shake. She was taking vitamins at rehab but had not bought any when she went back home. Pt uses a Rolator at home.   8/12 - cortrak placed; tip distal stomach per xray.   Medications reviewed and include: folic acid, SSI, MVI with minerals, protonix, vitamin B6, thiamine    Labs reviewed:  K 3.4 CBG's: 125-152  Diet Order:   Diet Order             Diet Carb Modified Fluid consistency: Thin; Room service appropriate? Yes with Assist  Diet effective now                   EDUCATION NEEDS:    Education needs have been addressed  Skin:  Skin Assessment: Reviewed RN Assessment  Last BM:  8/13 large  Height:   Ht Readings from Last 1 Encounters:  09/08/22 5\' 9"  (1.753 m)    Weight:   Wt Readings from Last 1 Encounters:  09/12/22 105.3 kg    BMI:  Body mass index is 34.28 kg/m.  Estimated Nutritional Needs:   Kcal:  1800-2000 kcals  Protein:  100-130 g  Fluid:  >/= 1.8 L   P., RD, LDN, CNSC See AMiON for contact information

## 2022-09-12 NOTE — Progress Notes (Signed)
NAME:  [name], MRN:  [id number], DOB:  07-Dec-1965, LOS: 3 ADMISSION DATE:  09/08/2022, CONSULTATION DATE:  8/12 REFERRING MD:  Dr Elliot Gurney, CHIEF COMPLAINT:  seizure   History of Present Illness:  Patient is a 57 year old female with pertinent PMH OHS/OSA, T2DM, CKD 4, COPD, diastolic CHF, alcoholism in remission, polysubstance abuse, lumbar radiculopathy chronic opioid use, bipolar, depression, HTN, HLD presents to [facility] on 8/11 w/ seizure.  On 8/11, patient send in to check on mother found her naked lying in bed talking delusional.  Bed noted to be soaked in urine.  EMS called for AMS the patient brought to [facility] ED. LKN 8/10 9 am.   Per son patient only drinks alcohol socially a few times a year and has had a previous history of cocaine use but has been clean for the past few months. Patient recently started on 0.1 clonidine bid to prevent opioid withdrawal.   Upon arrival patient lethargic but able to wake up but very confused.  Afebrile and hypertensive 189/121. Cbg 115. Ethanol, acetaminophen/salicylate level, UDS, ck, ammonia WNL. TSH 0.274 and t4 pending. CT head no acute abnormality. Patient given labetalol for htn. Patient admitted to family medicine service. MRI ordered to rule out pres and home meds gabapentin/clonidine held. HIV, RPR, B12, folate pending. While in ED patient had seizure episode requiring ativan and intubation for airway protection. Loaded w/ keppra. PCCM and neuro consulted.   Pertinent  Medical History   Past Medical History:  Diagnosis Date   Alcoholism in remission (HCC)    11-26-2021  pt stated no alcohol few yrs ago   Asthma    Bipolar disorder (HCC)    Chronic diastolic CHF (congestive heart failure) (HCC) 2014   followed by dr b. Cristal Deer;   Chronic hypoxemic respiratory failure [facility])    pulmonologist--- dr Tonia Brooms;   (11-26-2021 per pt was on supplemental oxygen until she 01/ 2022 did not need it,  does not check O2 sats at home anymore.   CKD  (chronic kidney disease), stage III [facility])    nephrologist--- dr Ronalee Belts   COPD (chronic obstructive pulmonary disease) (HCC)    11-26-2021 pt trying to quit smoking ,  started age 9   Depression    Diabetes mellitus type 2, diet-controlled (HCC)    followed by pcp   (11-26-2021  per pt last A1c by pcp on 10-30-2021 7.8,  stated does not check blood sugar)   Dyspnea    Fibrocystic breast disease    GAD (generalized anxiety disorder)    GERD (gastroesophageal reflux disease)    Hepatitis B surface antigen positive 11/2017   pt stated asymptomatic ,  no treatment   History of cervical dysplasia 04/10/2011   '93- once dx.-got pregnant-no intervention, then postpartum, no dysplasia found   History of condyloma acuminatum 04/09/2011   Hyperlipidemia    Hypertension    Incomplete left bundle branch block (LBBB)    Lung nodule 11/2017   pulmonologist--- dr Tonia Brooms;  RUL and RLL   Muscle spasm of both lower legs    chronic , due to back issues,   OA (osteoarthritis) 04/10/2011   hips, shoulders, back   OSA (obstructive sleep apnea) 2009   study on epic 09-18-2007 mild osa w/ hypoxmia ;   (11-26-2021 per pt last used cpap 01/ 2022 before bariatric surgery 04/ 2022 stated lost wt / and stopped needing supplemental oxygen , did not need anymore)   Polyneuropathy    due to back issues  and DM   Polysubstance abuse (HCC)    11-26-2021  pt hx cocaine use stated none for yrs but ED visit 08-14-2021  positive UDS,  pt stated it was once only for depression none since   PSVT (paroxysmal supraventricular tachycardia)    followed by cardiology   Spinal stenosis, unspecified spinal region    Urge incontinence of urine    Wears dentures    upper     Significant Hospital Events: Including procedures, antibiotic start and stop dates in addition to other pertinent events   8/12 admitted w/ AMS; intially concerned PRES; patient had seizure episode and intubated 8/13 No seizures seen on EEG. LTM  stopped. Weaned off Cleviprex. 8/14 extubated. Restarted on clevi late in day shift  8/15 Adding clonidine, incr coreg, weaning clevi     Interim History / Subjective:   Had to be restarted on clevi   Objective   Blood pressure (!) 153/89, pulse 92, temperature 98.7 F (37.1 C), temperature source Axillary, resp. rate (!) 29, height 5\' 9"  (1.753 m), weight 105.3 kg, last menstrual period 01/29/1992, SpO2 93%.    FiO2 (%):  [28 %] 28 %   Intake/Output Summary (Last 24 hours) at 09/12/2022 1346 Last data filed at 09/12/2022 1200 Gross per 24 hour  Intake 3088.01 ml  Output 4025 ml  Net -936.99 ml   Filed Weights   09/10/22 0348 09/11/22 1433 09/12/22 0500  Weight: 103.6 kg 107.5 kg 105.3 kg    Examination: General: wdwn adult F NAD  HEENT: NCAT pink mm  Neuro: AAOx4 following commands  CV: rr cap refill brisk  PULM:  CTAb  GI: soft ndnt  Extremities: no acute joint deformity no cyanosis or clubbing  Skin: c/d/w. LUE bruising proximal to old extrav site    Resolved problems   Acute resp failure Acute encephalopathy  Concern for sz  HTN emergency  AKI Assessment & Plan:   OHS COPD Chronic hypoxic resp failure (2L)  -extubated 8/14 P -pulm hygiene mobility  -was on trelegy at SNF, yupelri brovana budesonide here  -noct NPPV   HTN, poorly controlled  Chronic diastolic HF HLD P - incr coreg, adding clonidine to existing dilt losartan spiro -wean off clevi gtt -lipitor   DM2 -SSI  Hx etoh use disorder Hx polysub abuse  Chronic pain Depression ?Bipolar disorder  P -congratulated for crack/cocaine cessation  -adding a lido patch for her back, if passes SLP then will add her home gabapentin -likely add home effexor, buspar tomorrow   Dispo: if able to come off cleviprex, transfer out of ICU   Best Practice (right click and "Reselect all SmartList Selections" daily)   Diet/type: NPO w/ oral meds DVT prophylaxis: prophylactic heparin  GI prophylaxis:  PPI Lines: N/A Foley:  N/A Code Status:  full code Last date of multidisciplinary goals of care discussion [8/15 pt updated   CRITICAL CARE Performed by: Lanier Clam   Total critical care time: 37 minutes  Critical care time was exclusive of separately billable procedures and treating other patients. Critical care was necessary to treat or prevent imminent or life-threatening deterioration.  Critical care was time spent personally by me on the following activities: development of treatment plan with patient and/or surrogate as well as nursing, discussions with consultants, evaluation of patient's response to treatment, examination of patient, obtaining history from patient or surrogate, ordering and performing treatments and interventions, ordering and review of laboratory studies, ordering and review of radiographic studies, pulse oximetry and  re-evaluation of patient's condition.  Tessie Fass MSN, AGACNP-BC [facility] Amion for pager  09/12/2022, 1:46 PM

## 2022-09-12 NOTE — Evaluation (Signed)
Physical Therapy Evaluation Patient Details Name: [name] MRN: [id number] DOB: May 21, 1965 Today's Date: 09/12/2022  History of Present Illness  Pt is a 57 y.o. female who presented 09/08/22 with seizures, suspected to be provoked in the setting of hypertensive emergency. MRI brain shows no acute abnormality. ETT 8/11 - 8/14. PMH includes: anxiety, arthritis, bipolar disorder, CKD4, COPD, CHF, HTN, HLD, OSA, polyneuropathy, depression, DM2, alcoholism in remission, cervical fusion 2013, L shoulder surgery 2021, Laminectomies 2022, polysubstance abuse   Clinical Impression  Pt presents with condition above and deficits mentioned below, see PT Problem List. PTA, she was mod I utilizing her rollator for functional mobility, living with a friend in a 1-level house with 4 STE. Pt reports chronic back pain along with R hip and knee pain/deficits, impacting her functional mobility and strength today. She demonstrates deficits in generalized strength, balance, activity tolerance, and power. She also needs repeated cues to keep the RW proximal to her when ambulating, suggesting potential STM and safety awareness deficits. She is needing minA for all functional mobility at this time and is at high risk for falls. Pt is not quite ready to d/c home as she does not have someone home with her to provide this level of care throughout the day as her friends and family all work. I anticipate she will progress quickly with frequent mobility though and could go home soon with HHPT follow-up. Will continue to follow acutely.        If plan is discharge home, recommend the following: A little help with walking and/or transfers;A little help with bathing/dressing/bathroom;Assistance with cooking/housework;Direct supervision/assist for medications management;Direct supervision/assist for financial management;Assist for transportation;Help with stairs or ramp for entrance   Can travel by private vehicle         Equipment Recommendations None recommended by PT  Recommendations for Other Services       Functional Status Assessment Patient has had a recent decline in their functional status and demonstrates the ability to make significant improvements in function in a reasonable and predictable amount of time.     Precautions / Restrictions Precautions Precautions: Fall;Other (comment) Precaution Comments: cortrak Restrictions Weight Bearing Restrictions: No      Mobility  Bed Mobility Overal bed mobility: Needs Assistance Bed Mobility: Supine to Sit     Supine to sit: Min assist, HOB elevated     General bed mobility comments: Pt had difficulty bringing legs to L to exit L side of bed as she was already semi-rolled to her R, thus transitioned to exiting R side of bed with noted improved lower extremity initiation to come off side of bed. MinA for pt to pull up on therapist to ascend trunk, extra time due to back pain.    Transfers Overall transfer level: Needs assistance Equipment used: Rolling walker (2 wheels) Transfers: Sit to/from Stand Sit to Stand: Min assist           General transfer comment: Extra time and minA to power up to stand from EOB. Cues provided to step back until bil legs touch the chair before sitting and to reach back to armrest of chair to control her descent to sit, but pt did not reach back as cued.    Ambulation/Gait Ambulation/Gait assistance: Min assist Gait Distance (Feet): 20 Feet Assistive device: Rolling walker (2 wheels) Gait Pattern/deviations: Step-through pattern, Decreased step length - right, Decreased step length - left, Decreased stride length, Trunk flexed, Knee flexed in stance - right, Knee flexed in stance -  left Gait velocity: reduced Gait velocity interpretation: <1.31 ft/sec, indicative of household ambulator   General Gait Details: Pt ambulates with decreased stride length and a flexed posture, pushing her RW distally from her  even though repeatedly cues to keep it proximal. MinA for balance and RW management.  Stairs            Wheelchair Mobility     Tilt Bed    Modified Rankin (Stroke Patients Only) Modified Rankin (Stroke Patients Only) Pre-Morbid Rankin Score: Slight disability Modified Rankin: Moderately severe disability     Balance Overall balance assessment: Needs assistance Sitting-balance support: No upper extremity supported, Feet supported Sitting balance-Leahy Scale: Fair     Standing balance support: Bilateral upper extremity supported, During functional activity, Reliant on assistive device for balance Standing balance-Leahy Scale: Poor Standing balance comment: Reliant on RW                             Pertinent Vitals/Pain Pain Assessment Pain Assessment: Faces Faces Pain Scale: Hurts little more Pain Location: back Pain Descriptors / Indicators: Sore, Discomfort Pain Intervention(s): Limited activity within patient's tolerance, Monitored during session, Repositioned    Home Living Family/patient expects to be discharged to:: Private residence Living Arrangements: Non-relatives/Friends Available Help at Discharge: Friend(s);Family;Available PRN/intermittently Type of Home: House Home Access: Stairs to enter Entrance Stairs-Rails: Left Entrance Stairs-Number of Steps: 4   Home Layout: One level Home Equipment: Rollator (4 wheels);Cane - single point;Tub bench;Grab bars - tub/shower;Grab bars - toilet;BSC/3in1      Prior Function Prior Level of Function : Independent/Modified Independent             Mobility Comments: Uses rollator ADLs Comments: Uses transportation services to get to appointments     Extremity/Trunk Assessment   Upper Extremity Assessment Upper Extremity Assessment: Defer to OT evaluation    Lower Extremity Assessment Lower Extremity Assessment: Generalized weakness;RLE deficits/detail RLE Deficits / Details: R lower  extremity mildly weaker than L, seemingly more due to chronic R hip and knee pain/issues per pt report; Gross MMT scores of 3+ to 4 on R (grossly 4 on L); denied numbness/tingling bil    Cervical / Trunk Assessment Cervical / Trunk Assessment: Other exceptions Cervical / Trunk Exceptions: hx of multiple back surgeries with chronic back pain  Communication   Communication Communication: No apparent difficulties  Cognition Arousal: Alert Behavior During Therapy: WFL for tasks assessed/performed Overall Cognitive Status: No family/caregiver present to determine baseline cognitive functioning Area of Impairment: Problem solving, Safety/judgement, Memory, Attention, Following commands                   Current Attention Level: Selective Memory: Decreased short-term memory Following Commands: Follows one step commands consistently, Follows multi-step commands with increased time Safety/Judgement: Decreased awareness of safety   Problem Solving: Slow processing, Requires verbal cues, Requires tactile cues General Comments: Pt A&Ox4, but displays deficits in attention and needs repeated cues to remain proximal to her RW to improve her safety. Unsure if this is baseline or not        General Comments General comments (skin integrity, edema, etc.): BP high but stable with positional changes, RN aware; pt reported dizziness (described as spinning dizziness) when she transitioned supine to sit EOB, no nystagmus noted and BP high but stable compared to supine    Exercises     Assessment/Plan    PT Assessment Patient needs continued PT services  PT Problem  List Decreased strength;Decreased activity tolerance;Decreased balance;Decreased mobility;Decreased cognition;Decreased coordination;Decreased safety awareness;Decreased knowledge of use of DME       PT Treatment Interventions DME instruction;Gait training;Stair training;Functional mobility training;Therapeutic exercise;Therapeutic  activities;Balance training;Neuromuscular re-education;Patient/family education;Cognitive remediation    PT Goals (Current goals can be found in the Care Plan section)  Acute Rehab PT Goals Patient Stated Goal: to improve and then go home PT Goal Formulation: With patient Time For Goal Achievement: 09/26/22 Potential to Achieve Goals: Good    Frequency Min 1X/week     Co-evaluation               AM-PAC PT "6 Clicks" Mobility  Outcome Measure Help needed turning from your back to your side while in a flat bed without using bedrails?: A Little Help needed moving from lying on your back to sitting on the side of a flat bed without using bedrails?: A Little Help needed moving to and from a bed to a chair (including a wheelchair)?: A Little Help needed standing up from a chair using your arms (e.g., wheelchair or bedside chair)?: A Little Help needed to walk in hospital room?: A Little Help needed climbing 3-5 steps with a railing? : A Little 6 Click Score: 18    End of Session Equipment Utilized During Treatment: Gait belt Activity Tolerance: Patient tolerated treatment well Patient left: in chair;with call bell/phone within reach;with chair alarm set Nurse Communication: Mobility status;Other (comment) (vitals) PT Visit Diagnosis: Unsteadiness on feet (R26.81);Other abnormalities of gait and mobility (R26.89);Muscle weakness (generalized) (M62.81);Difficulty in walking, not elsewhere classified (R26.2)    Time: 1610-9604 PT Time Calculation (min) (ACUTE ONLY): 22 min   Charges:   PT Evaluation $PT Eval Moderate Complexity: 1 Mod   PT General Charges $$ ACUTE PT VISIT: 1 Visit         Raymond Gurney, PT, DPT Acute Rehabilitation Services  Office: [phone]   Jewel Baize 09/12/2022, 1:27 PM

## 2022-09-12 NOTE — Progress Notes (Signed)
Contacted by critical care team in regards to taking over patient care now that she is transferring out of the ICU.  Will take over patient's care at 7 AM on 8/16.

## 2022-09-12 NOTE — Plan of Care (Signed)

## 2022-09-13 ENCOUNTER — Encounter (HOSPITAL_COMMUNITY): Payer: Self-pay | Admitting: Pulmonary Disease

## 2022-09-13 ENCOUNTER — Other Ambulatory Visit (HOSPITAL_COMMUNITY): Payer: Self-pay

## 2022-09-13 DIAGNOSIS — R519 Headache, unspecified: Secondary | ICD-10-CM | POA: Insufficient documentation

## 2022-09-13 DIAGNOSIS — R4182 Altered mental status, unspecified: Secondary | ICD-10-CM | POA: Diagnosis not present

## 2022-09-13 DIAGNOSIS — F1193 Opioid use, unspecified with withdrawal: Secondary | ICD-10-CM | POA: Diagnosis not present

## 2022-09-13 LAB — GLUCOSE, CAPILLARY
Glucose-Capillary: 111 mg/dL — ABNORMAL HIGH (ref 70–99)
Glucose-Capillary: 118 mg/dL — ABNORMAL HIGH (ref 70–99)
Glucose-Capillary: 122 mg/dL — ABNORMAL HIGH (ref 70–99)
Glucose-Capillary: 140 mg/dL — ABNORMAL HIGH (ref 70–99)
Glucose-Capillary: 191 mg/dL — ABNORMAL HIGH (ref 70–99)
Glucose-Capillary: 86 mg/dL (ref 70–99)

## 2022-09-13 LAB — BASIC METABOLIC PANEL
Anion gap: 12 (ref 5–15)
BUN: 21 mg/dL — ABNORMAL HIGH (ref 6–20)
CO2: 25 mmol/L (ref 22–32)
Calcium: 9.8 mg/dL (ref 8.9–10.3)
Chloride: 102 mmol/L (ref 98–111)
Creatinine, Ser: 0.85 mg/dL (ref 0.44–1.00)
GFR, Estimated: >60 mL/min (ref >60)
Glucose, Bld: 111 mg/dL — ABNORMAL HIGH (ref 70–99)
Potassium: 4 mmol/L (ref 3.5–5.1)
Sodium: 139 mmol/L (ref 135–145)

## 2022-09-13 MED ORDER — CLONIDINE HCL 0.2 MG PO TABS
0.2000 mg | ORAL_TABLET | Freq: Two times a day (BID) | ORAL | 11 refills | Status: AC
  Filled 2022-09-13 – 2022-10-10 (×2): qty 60, 30d supply, fill #0
  Filled 2022-12-25: qty 60, 30d supply, fill #1

## 2022-09-13 MED ORDER — NAPROXEN 250 MG PO TABS
500.0000 mg | ORAL_TABLET | Freq: Two times a day (BID) | ORAL | Status: DC | PRN
  Filled 2022-09-13: qty 2

## 2022-09-13 MED ORDER — GABAPENTIN 100 MG PO CAPS
200.0000 mg | ORAL_CAPSULE | Freq: Three times a day (TID) | ORAL | 0 refills | Status: DC
  Filled 2022-09-13: qty 180, 30d supply, fill #0

## 2022-09-13 MED ORDER — ADULT MULTIVITAMIN W/MINERALS CH: 1.0000 | ORAL_TABLET | Freq: Every day | ORAL | Status: DC

## 2022-09-13 MED ORDER — SPIRONOLACTONE 25 MG PO TABS
25.0000 mg | ORAL_TABLET | Freq: Every day | ORAL | 0 refills | Status: DC
Start: 2022-09-13 — End: 2022-10-10
  Filled 2022-09-13: qty 30, 30d supply, fill #0

## 2022-09-13 MED ORDER — LOSARTAN POTASSIUM 100 MG PO TABS
100.0000 mg | ORAL_TABLET | Freq: Every day | ORAL | 0 refills | Status: DC
  Filled 2022-09-13: qty 30, 30d supply, fill #0

## 2022-09-13 MED ORDER — INSULIN ASPART 100 UNIT/ML IJ SOLN
0.0000 [IU] | Freq: Three times a day (TID) | INTRAMUSCULAR | Status: DC
  Administered 2022-09-13: 2 [IU] via SUBCUTANEOUS

## 2022-09-13 MED ORDER — CARVEDILOL 12.5 MG PO TABS
12.5000 mg | ORAL_TABLET | Freq: Two times a day (BID) | ORAL | 0 refills | Status: DC
  Filled 2022-09-13: qty 60, 30d supply, fill #0

## 2022-09-13 MED ORDER — LIDOCAINE 5 % EX PTCH
1.0000 | MEDICATED_PATCH | CUTANEOUS | 0 refills | Status: DC
  Filled 2022-09-13: qty 30, 30d supply, fill #0

## 2022-09-13 MED ORDER — EMPAGLIFLOZIN 10 MG PO TABS
10.0000 mg | ORAL_TABLET | Freq: Every day | ORAL | Status: DC
  Administered 2022-09-13: 10 mg via ORAL
  Filled 2022-09-13: qty 1

## 2022-09-13 MED ORDER — ENOXAPARIN SODIUM 40 MG/0.4ML IJ SOSY
40.0000 mg | PREFILLED_SYRINGE | Freq: Every day | INTRAMUSCULAR | Status: DC
  Administered 2022-09-13: 40 mg via SUBCUTANEOUS
  Filled 2022-09-13: qty 0.4

## 2022-09-13 MED ORDER — ASPIRIN-ACETAMINOPHEN-CAFFEINE 250-250-65 MG PO TABS: 1.0000 | ORAL_TABLET | Freq: Three times a day (TID) | ORAL | Status: DC | PRN

## 2022-09-13 MED ORDER — BUSPIRONE HCL 10 MG PO TABS
10.0000 mg | ORAL_TABLET | Freq: Three times a day (TID) | ORAL | Status: DC
  Administered 2022-09-13: 10 mg via ORAL
  Filled 2022-09-13: qty 1

## 2022-09-13 NOTE — Assessment & Plan Note (Deleted)
Suspected to be secondary to hypertensive emergency. Neurology was consulted, patient had continuous long-term EEG with no seizure activity. Propofol sedation discontinued 8/14 with no further seizure activity Neurology signed off at this time

## 2022-09-13 NOTE — Progress Notes (Signed)
Physical Therapy Treatment Patient Details Name: [name] MRN: [id number] DOB: March 13, 1965 Today's Date: 09/13/2022   History of Present Illness Pt is a 57 y.o. female who presented 09/08/22 with seizures, suspected to be provoked in the setting of hypertensive emergency. MRI brain shows no acute abnormality. ETT 8/11 - 8/14. PMH includes: anxiety, arthritis, bipolar disorder, CKD4, COPD, CHF, HTN, HLD, OSA, polyneuropathy, depression, DM2, alcoholism in remission, cervical fusion 2013, L shoulder surgery 2021, Laminectomies 2022, polysubstance abuse.    PT Comments  Struggling with mobility a bit more this morning. Required up to mod assist initially to rise from bed, but with repetition achieved min assist level. Min assist to ambulate with RW, had a couple episodes of LOB needing therapist to correct and prevent falls. Due to limited support at home (per pt report) and slower than expected recovery, would suggest looking into post-acute rehab stay. Ideally, CIR would likely progress her quickly to a supervision or mod I level and d/c directly to home, however unsure if she will meet medical complexity for admission. In which case, SNF should be considered. Will continue to progress as tolerated during acute admission- encouraged OOB frequently with staff. Mobilize in room with staff, use of RW. Patient will continue to benefit from skilled physical therapy services to further improve independence with functional mobility.     If plan is discharge home, recommend the following: A little help with bathing/dressing/bathroom;Assistance with cooking/housework;Direct supervision/assist for medications management;Direct supervision/assist for financial management;Assist for transportation;Help with stairs or ramp for entrance;A lot of help with walking and/or transfers   Can travel by private vehicle        Equipment Recommendations  None recommended by PT    Recommendations for Other Services  Rehab consult     Precautions / Restrictions Precautions Precautions: Fall;Other (comment) Precaution Comments: cortrak Restrictions Weight Bearing Restrictions: No     Mobility  Bed Mobility Overal bed mobility: Needs Assistance Bed Mobility: Supine to Sit     Supine to sit: HOB elevated, Contact guard     General bed mobility comments: CGA for safety, slow to rise, but no physical assist. Cues for technique.    Transfers Overall transfer level: Needs assistance Equipment used: Rolling walker (2 wheels) Transfers: Sit to/from Stand Sit to Stand: Mod assist, From elevated surface           General transfer comment: Unable to safely rise from low bed setting today. Performed from elevated surface, up to mod assist, progressed to min assist with repetition, cues for technique, hand placement and forward weight shift onto RW once standing (posterior instability.)    Ambulation/Gait Ambulation/Gait assistance: Min assist Gait Distance (Feet): 40 Feet Assistive device: Rolling walker (2 wheels) Gait Pattern/deviations: Step-through pattern, Decreased step length - right, Decreased step length - left, Decreased stride length, Trunk flexed, Knee flexed in stance - right, Knee flexed in stance - left, Leaning posteriorly Gait velocity: reduced Gait velocity interpretation: <1.31 ft/sec, indicative of household ambulator   General Gait Details: VC for gait symmetry, upright posture, and walker placement for proximity. Min assist for walker control and a couple episodes of loss of balance with posterior instability.   Stairs             Wheelchair Mobility     Tilt Bed    Modified Rankin (Stroke Patients Only)       Balance Overall balance assessment: Needs assistance Sitting-balance support: No upper extremity supported, Feet supported Sitting balance-Leahy Scale: Fair  Standing balance support: Bilateral upper extremity supported, During functional  activity, Reliant on assistive device for balance Standing balance-Leahy Scale: Poor Standing balance comment: Reliant on RW, CGA - min assist                            Cognition Arousal: Alert Behavior During Therapy: WFL for tasks assessed/performed Overall Cognitive Status: No family/caregiver present to determine baseline cognitive functioning                                 General Comments: Appropriate throughout session.        Exercises General Exercises - Lower Extremity Ankle Circles/Pumps: AROM, Both, 10 reps, Seated Quad Sets: Strengthening, Both, 10 reps, Seated Gluteal Sets: Strengthening, Both, 10 reps, Seated    General Comments General comments (skin integrity, edema, etc.): SpO2 on room air at rest 88-92%. Ambulating 94% on RA. Dizziness upon sitting, BP WNL      Pertinent Vitals/Pain Pain Assessment Pain Assessment: Faces Faces Pain Scale: Hurts little more Pain Location: back Pain Descriptors / Indicators: Sore, Discomfort Pain Intervention(s): Monitored during session, Repositioned    Home Living                          Prior Function            PT Goals (current goals can now be found in the care plan section) Acute Rehab PT Goals Patient Stated Goal: to improve and then go home PT Goal Formulation: With patient Time For Goal Achievement: 09/26/22 Potential to Achieve Goals: Good Progress towards PT goals: Progressing toward goals    Frequency    Min 1X/week      PT Plan      Co-evaluation              AM-PAC PT "6 Clicks" Mobility   Outcome Measure  Help needed turning from your back to your side while in a flat bed without using bedrails?: A Little Help needed moving from lying on your back to sitting on the side of a flat bed without using bedrails?: A Little Help needed moving to and from a bed to a chair (including a wheelchair)?: A Lot Help needed standing up from a chair using  your arms (e.g., wheelchair or bedside chair)?: A Lot Help needed to walk in hospital room?: A Little Help needed climbing 3-5 steps with a railing? : A Little 6 Click Score: 16    End of Session Equipment Utilized During Treatment: Gait belt Activity Tolerance: Patient tolerated treatment well Patient left: in chair;with call bell/phone within reach;with chair alarm set;with SCD's reapplied Nurse Communication: Mobility status PT Visit Diagnosis: Unsteadiness on feet (R26.81);Other abnormalities of gait and mobility (R26.89);Muscle weakness (generalized) (M62.81);Difficulty in walking, not elsewhere classified (R26.2)     Time: 8413-2440 PT Time Calculation (min) (ACUTE ONLY): 31 min  Charges:    $Gait Training: 8-22 mins $Therapeutic Activity: 8-22 mins PT General Charges $$ ACUTE PT VISIT: 1 Visit                     Kathlyn Sacramento, PT, DPT [facility]  Rehabilitation Services Physical Therapist Office: [phone] Website: [email or web address]    Berton Mount 09/13/2022, 11:21 AM

## 2022-09-13 NOTE — Assessment & Plan Note (Deleted)
-   SOB has improved after breathing treatments - Lungs CTAB

## 2022-09-13 NOTE — Assessment & Plan Note (Signed)
Stable with breathing treatments

## 2022-09-13 NOTE — Assessment & Plan Note (Signed)
-  Patient has had a headache since leaving ICU -MRI of head negative for acute changes - trial Excedrin for headache

## 2022-09-13 NOTE — Significant Event (Signed)
AVS has been reviewed with patient; a copy of it given to patient. Patient awaiting on transportation which she says will be here around 1930. Patient has all her belongings with her to take home (cellphone, purse).

## 2022-09-13 NOTE — Assessment & Plan Note (Addendum)
Creatinine now 0.85, GFR >60. Improved from 1.58 with a GFR of 37.  Baseline creatinine  around 1.1.

## 2022-09-13 NOTE — Assessment & Plan Note (Addendum)
Hgb of 16.5 improved from 19.4.  Suspect it was likely hemoconcentration in the setting  of decreased p.o. intake vs hydration.   - Trend with daily CBC

## 2022-09-13 NOTE — Assessment & Plan Note (Deleted)
Restarted home buspar. Holding effexor due to potential HTN effects. Will consider adding SSRI instead - discuss with pt

## 2022-09-13 NOTE — Progress Notes (Signed)
Speech Language Pathology Treatment: Dysphagia  Patient Details Name: [name] MRN: [id number] DOB: 1965/11/05 Today's Date: 09/13/2022 Time: 7829-5621 SLP Time Calculation (min) (ACUTE ONLY): 10 min  Assessment / Plan / Recommendation Clinical Impression  Pt did not each much of her breakfast tray this morning, attributing this to the sausage and biscuit being too hard and dry. She denies having similar difficulties at her other meals yesterday since she was started on solids. She consumed regular solids and thin liquids during skilled observation from SLP with no overt s/s of aspiration or dysphagia observed. Education was provided about the option of a mechanical soft diet, but pt declines that at this time, preferring to stay on regular diet and commenting on the softer types of foods that she plans to order. Will leave on current diet - no SLP f/u for dysphagia indicated at this time. If pt changes her mind and decides to try mechanical soft foods, this change can be made without the need to re-order SLP.    HPI HPI: Patient is a 57 y.o. female with PMH: OHS/OSA, T2DM, CKD 4, COPD, diastolic CHF, alcoholism in remission, polysubstance abuse, lumbar radiculopathy chronic opioid use, bipolar, depression, HTN, HLD presents to [facility] on 8/11 w/ seizure. In ED she was lethargic, but able to wake up, very confused. CT head negative for acute abnormality. MRI was also negative for acute findings. While in ED, she had a seizure requiring Ativan and intubation. She was ultimately extubated to oxygen via nasal cannula on 09/11/22 at 1112. She failed [facility] and is NPO awaiting SLP swallow evaluation.      SLP Plan  Continue with current plan of care      Recommendations for follow up therapy are one component of a multi-disciplinary discharge planning process, led by the attending physician.  Recommendations may be updated based on patient status, additional functional criteria and insurance  authorization.    Recommendations  Diet recommendations: Regular;Thin liquid Liquids provided via: Cup;Straw Medication Administration: Whole meds with puree Supervision: Staff to assist with self feeding Compensations: Slow rate;Small sips/bites;Minimize environmental distractions Postural Changes and/or Swallow Maneuvers: Seated upright 90 degrees                  Oral care BID   PRN Dysphagia, unspecified (R13.10)     Continue with current plan of care     [name]., M.A. CCC-SLP Acute Rehabilitation Services Office [phone]  Secure chat preferred   09/13/2022, 11:37 AM

## 2022-09-13 NOTE — Progress Notes (Signed)
Inpatient Rehab Admissions Coordinator:   Per therapy recommendations, patient was screened for CIR candidacy by Megan Salon, MS, CCC-SLP. Once pt. Is stabilized and seizures are under control, I do not think Pt. Will have need to be in an inpatient program. At this time, Pt. does not appear to demonstrate medical necessity to justify in hospital rehabilitation/CIR. Additionally, I do not think Pt's insurance will approve CIR for this diagnosis. I will not pursue a rehab consult for this Pt.   Recommend other rehab venues to be pursued.  Please contact me with any questions.  Megan Salon, MS, CCC-SLP Rehab Admissions Coordinator  [phone] (celll) [phone] (office)

## 2022-09-13 NOTE — Evaluation (Signed)
Occupational Therapy Evaluation Patient Details Name: [name] MRN: [id number] DOB: [birth year] Today's Date: 09/13/2022   History of Present Illness Pt is a 57 y.o. female who presented 09/08/22 with seizures, suspected to be provoked in the setting of hypertensive emergency. MRI brain shows no acute abnormality. ETT 8/11 - 8/14. PMH includes: anxiety, arthritis, bipolar disorder, CKD4, COPD, CHF, HTN, HLD, OSA, polyneuropathy, depression, DM2, alcoholism in remission, cervical fusion 2013, L shoulder surgery 2021, Laminectomies 2022, polysubstance abuse.   Clinical Impression   PT admitted with seizure and HTN. Pt currently with functional limitiations due to the deficits listed below (see OT problem list). Pt at baseline MOD I and currently using RW for safety. Pt needs increased time to complete adls.  Pt will benefit from skilled OT to increase their independence and safety with adls and balance to allow discharge Patient will benefit from intensive inpatient follow up therapy, >3 hours/day .        If plan is discharge home, recommend the following: A little help with walking and/or transfers;A little help with bathing/dressing/bathroom    Functional Status Assessment  Patient has had a recent decline in their functional status and demonstrates the ability to make significant improvements in function in a reasonable and predictable amount of time.  Equipment Recommendations  None recommended by OT    Recommendations for Other Services Rehab consult     Precautions / Restrictions Precautions Precautions: Fall;Other (comment) Precaution Comments: cortrak Restrictions Weight Bearing Restrictions: No      Mobility Bed Mobility Overal bed mobility: Needs Assistance Bed Mobility: Supine to Sit     Supine to sit: HOB elevated, Min assist     General bed mobility comments: increased time and x2 efforts to reach upright posture    Transfers Overall transfer level:  Needs assistance Equipment used: Rolling walker (2 wheels) Transfers: Sit to/from Stand Sit to Stand: Min assist           General transfer comment: pt able to power up from low surface with increased time and x2 attempts      Balance Overall balance assessment: Needs assistance Sitting-balance support: Bilateral upper extremity supported, Feet supported Sitting balance-Leahy Scale: Fair     Standing balance support: Bilateral upper extremity supported, During functional activity, Reliant on assistive device for balance Standing balance-Leahy Scale: Poor                             ADL either performed or assessed with clinical judgement   ADL Overall ADL's : Needs assistance/impaired Eating/Feeding: Modified independent   Grooming: Wash/dry hands;Minimal assistance;Standing   Upper Body Bathing: Supervision/ safety;Sitting   Lower Body Bathing: Minimal assistance;Sit to/from stand           Toilet Transfer: Minimal assistance;Ambulation;Rolling walker (2 wheels);BSC/3in1           Functional mobility during ADLs: Minimal assistance;Rolling walker (2 wheels)       Vision Baseline Vision/History: 1 Wears glasses Ability to See in Adequate Light: 0 Adequate Vision Assessment?: No apparent visual deficits     Perception         Praxis         Pertinent Vitals/Pain Pain Assessment Pain Assessment: Faces Faces Pain Scale: Hurts little more Pain Location: back Pain Descriptors / Indicators: Discomfort, Grimacing, Guarding Pain Intervention(s): Limited activity within patient's tolerance, Monitored during session, Repositioned     Extremity/Trunk Assessment Upper Extremity Assessment Upper Extremity Assessment:  Generalized weakness   Lower Extremity Assessment Lower Extremity Assessment: Defer to PT evaluation   Cervical / Trunk Assessment Cervical / Trunk Assessment: Other exceptions Cervical / Trunk Exceptions: hx of multiple back  surgeries with chronic back pain   Communication Communication Communication: No apparent difficulties   Cognition Arousal: Alert Behavior During Therapy: WFL for tasks assessed/performed Overall Cognitive Status: No family/caregiver present to determine baseline cognitive functioning Area of Impairment: Problem solving                             Problem Solving: Slow processing General Comments: increased time to respond     General Comments  RA    Exercises     Shoulder Instructions      Home Living Family/patient expects to be discharged to:: Private residence Living Arrangements: Non-relatives/Friends Available Help at Discharge: Friend(s);Family;Available PRN/intermittently Type of Home: House Home Access: Stairs to enter [employer] of Steps: 4 Entrance Stairs-Rails: Left Home Layout: One level     Bathroom Shower/Tub: Chief Strategy Officer: Handicapped height Bathroom Accessibility: Yes How Accessible: Accessible via walker Home Equipment: Rollator (4 wheels);Cane - single point;Tub bench;Grab bars - tub/shower;Grab bars - toilet;BSC/3in1          Prior Functioning/Environment Prior Level of Function : Independent/Modified Independent             Mobility Comments: Uses rollator ADLs Comments: Uses transportation services to get to appointments        OT Problem List: Decreased strength;Decreased range of motion;Decreased activity tolerance;Impaired balance (sitting and/or standing);Decreased safety awareness;Decreased cognition;Decreased knowledge of use of DME or AE;Decreased knowledge of precautions;Obesity;Pain      OT Treatment/Interventions: Self-care/ADL training;Therapeutic exercise;Neuromuscular education;Energy conservation;DME and/or AE instruction;Manual therapy;Modalities;Therapeutic activities;Cognitive remediation/compensation;Patient/family education;Balance training    OT Goals(Current goals can  be found in the care plan section) Acute Rehab OT Goals Patient Stated Goal: to get stronger OT Goal Formulation: With patient Time For Goal Achievement: 09/27/22 Potential to Achieve Goals: Good  OT Frequency: Min 1X/week    Co-evaluation              AM-PAC OT "6 Clicks" Daily Activity     Outcome Measure Help from another person eating meals?: None Help from another person taking care of personal grooming?: A Little Help from another person toileting, which includes using toliet, bedpan, or urinal?: A Little Help from another person bathing (including washing, rinsing, drying)?: A Little Help from another person to put on and taking off regular upper body clothing?: A Little Help from another person to put on and taking off regular lower body clothing?: A Little 6 Click Score: 19   End of Session Equipment Utilized During Treatment: Gait belt;Rolling walker (2 wheels) Nurse Communication: Mobility status;Precautions  Activity Tolerance: Patient tolerated treatment well Patient left: in chair;with call bell/phone within reach;with chair alarm set  OT Visit Diagnosis: Unsteadiness on feet (R26.81);Muscle weakness (generalized) (M62.81)                Time: 9562-1308 OT Time Calculation (min): 13 min Charges:  OT General Charges $OT Visit: 1 Visit OT Evaluation $OT Eval Moderate Complexity: 1 Mod   Brynn, OTR/L  Acute Rehabilitation Services Office: [phone] .   Mateo Flow 09/13/2022, 12:38 PM

## 2022-09-13 NOTE — TOC Initial Note (Signed)
Transition of Care [facility]) - Initial/Assessment Note    Patient Details  Name: [name] MRN: [id number] Date of Birth: 04-25-1965  Transition of Care [facility]) CM/SW Contact:    Kermit Balo, RN Phone Number: 09/13/2022, 2:24 PM  Clinical Narrative:                 Pt is from home with her friend. She says she has family that can provide some assist at home.  Pt continues with cortrak.  Pt manages her own medications without any issues. Current recommendations are for CIR. Awaiting CIR eval.  Pt states she uses medical transport outside the hospital. Her family would provide transport home when ready for d/c.  TOC following.  Expected Discharge Plan: IP Rehab Facility Barriers to Discharge: Continued Medical Work up   Patient Goals and CMS Choice   CMS Medicare.gov Compare Post Acute Care list provided to:: Patient Choice offered to / list presented to : Patient      Expected Discharge Plan and Services   Discharge Planning Services: CM Consult Post Acute Care Choice: IP Rehab Living arrangements for the past 2 months: Single Family Home                                      Prior Living Arrangements/Services Living arrangements for the past 2 months: Single Family Home Lives with:: Friends Patient language and need for interpreter reviewed:: Yes Do you feel safe going back to the place where you live?: Yes        Care giver support system in place?: Yes (comment) Current home services: DME (rollator/ shower seat) Criminal Activity/Legal Involvement Pertinent to Current Situation/Hospitalization: No - Comment as needed  Activities of Daily Living      Permission Sought/Granted                  Emotional Assessment Appearance:: Appears stated age Attitude/Demeanor/Rapport: Engaged Affect (typically observed): Accepting Orientation: : Oriented to Self, Oriented to Place, Oriented to  Time, Oriented to Situation   Psych Involvement: No  (comment)  Admission diagnosis:  Seizure (HCC) [R56.9] Altered mental status, unspecified altered mental status type [R41.82] AMS (altered mental status) [R41.82] Patient Active Problem List   Diagnosis Date Noted   Hypertension 09/12/2022   Chronic obstructive pulmonary disease (HCC) 09/12/2022   Chronic diastolic HF (heart failure) (HCC) 09/12/2022   Delirium 09/09/2022   AMS (altered mental status) 09/09/2022   Seizure (HCC) 09/09/2022   Malnutrition of moderate degree 09/09/2022   CHF (congestive heart failure) (HCC) 07/11/2022   Acute on chronic diastolic congestive heart failure (HCC) 07/10/2022   Acute on chronic heart failure with preserved ejection fraction (HFpEF) (HCC) 06/09/2022   Acute exacerbation of CHF (congestive heart failure) (HCC) 06/09/2022   Panniculitis 12/04/2021   Infection of lip 08/27/2021   Lumbar stenosis with neurogenic claudication 06/29/2021   Lumbar radiculopathy 06/29/2021   Biceps tendinopathy of right upper extremity 05/03/2021   Irregular heartbeat 04/11/2021   Neuropathic pain of foot 03/28/2021   Chronic congestion of paranasal sinus 03/28/2021   Impingement syndrome of right shoulder 03/08/2021   Arthritis of right acromioclavicular joint 03/08/2021   Acute medial meniscus tear of right knee 03/08/2021   Acute lateral meniscus tear of right knee 03/08/2021   Spinal stenosis of thoracic region with radiculopathy    Bilateral leg numbness    Spinal stenosis of lumbar region  with neurogenic claudication    Primary osteoarthritis of right knee    Impaired ambulation 01/10/2021   Status post lumbar laminectomy 10/03/2020   Cubital tunnel syndrome on left 05/02/2020   Vaginal discharge 01/20/2020   Prolapse of anterior vaginal wall 11/10/2019   Skin ulcer of left thigh, limited to breakdown of skin (HCC) 09/09/2019   Ulnar nerve compression 08/06/2019   Diabetic neuropathy associated with type 2 diabetes mellitus (HCC) 08/06/2019   Body mass  index 50.0-59.9, adult (HCC) 06/12/2019   Impingement syndrome of left shoulder 05/07/2019   Polyp of colon    Polyp of ascending colon    Rectal discomfort 02/12/2019   Osteoarthritis of right hip 02/12/2019   Nontraumatic tear of left supraspinatus tendon 12/30/2018   Gout 12/17/2018   Nontraumatic complete tear of right rotator cuff 12/08/2018   Rotator cuff tear, right 12/01/2018   Tobacco abuse 08/21/2018   Right groin pain 07/20/2018   Depressed mood 07/13/2018   Paroxysmal SVT (supraventricular tachycardia) 03/20/2018   Pure hypercholesterolemia 03/20/2018   Muscle cramping 02/25/2018   Nodule of upper lobe of right lung 02/13/2018   Falls, subsequent encounter 01/30/2018   Cervical post-laminectomy syndrome 11/13/2017   Pain in left foot 11/04/2017   Essential hypertension 10/27/2017   Solitary pulmonary nodule 10/07/2017   Restrictive lung disease secondary to obesity 10/01/2017   Polycythemia, secondary 10/01/2017   Chronic viral hepatitis B without delta-agent (HCC) 07/08/2017   COPD (chronic obstructive pulmonary disease) with chronic bronchitis 06/27/2017   Chronic kidney disease (CKD), stage IV (severe) (HCC) 01/14/2017   Decubitus ulcer 12/13/2016   Type 2 diabetes mellitus with stage 3 chronic kidney disease, with long-term current use of insulin (HCC) 12/12/2016   Urge incontinence of urine 12/05/2016   Trigger finger, left index finger 07/15/2016   Back pain 04/07/2014   Morbid obesity (HCC) 10/02/2012   Chronic diastolic CHF (congestive heart failure) (HCC) 04/07/2012   Skin lesion of scalp 03/15/2011   GERD 01/26/2010   Obstructive sleep apnea treated with BiPAP 02/03/2008   FIBROCYSTIC BREAST DISEASE 10/28/2006   Hyperlipidemia 03/27/2006   Obesity hypoventilation syndrome (HCC) 03/27/2006   Depression with anxiety 03/27/2006   PCP:  Jerre Simon, MD Pharmacy:   [facility] - [place], [place] - [address] [place] [address] Phone: [phone] Fax: [phone]  [id number] - [place], [place] - [address] AT [facility] [address] [place] [address] Phone: [phone] Fax: [phone]     Social Determinants of Health (SDOH) Social History: SDOH Screenings   Food Insecurity: No Food Insecurity (07/11/2022)  Housing: Low Risk  (07/11/2022)  Transportation Needs: No Transportation Needs (07/11/2022)  Recent Concern: Transportation Needs - Unmet Transportation Needs (06/09/2022)  Utilities: Not At Risk (07/11/2022)  Alcohol Screen: Low Risk  (04/02/2022)  Depression (PHQ2-9): Low Risk  (08/28/2022)  Financial Resource Strain: Low Risk  (06/06/2022)  Physical Activity: Inactive (06/06/2022)  Social Connections: Socially Isolated (06/06/2022)  Stress: No Stress Concern Present (06/06/2022)  Tobacco Use: High Risk (08/28/2022)   SDOH Interventions:     Readmission Risk Interventions    07/12/2022   10:06 AM  Readmission Risk Prevention Plan  Transportation Screening Complete  PCP or Specialist Appt within 5-7 Days Complete  Home Care Screening Complete  Medication Review (RN CM) Complete

## 2022-09-13 NOTE — Discharge Summary (Addendum)
Family Medicine Teaching [facility] Discharge Summary  Patient name: [name] Medical record number: [id number] Date of birth: 1965/08/19 Age: 57 y.o. Gender: female Date of Admission: 09/08/2022  Date of Discharge: 09/13/22 Admitting Physician: Oretha Milch, MD  Primary Care Provider: Jerre Simon, MD Consultants: neurology, CCM  Indication for Hospitalization: AMS  Discharge Diagnoses/Problem List:  Principal Problem for Admission: AMS Other Problems addressed during stay:  Principal Problem:   AMS (altered mental status) Active Problems:   Depression with anxiety   Chronic kidney disease (CKD), stage IV (severe) (HCC)   Polycythemia, secondary   Seizure (HCC)   Malnutrition of moderate degree   Hypertension   Chronic obstructive pulmonary disease (HCC)   Chronic diastolic HF (heart failure) [facility])   Headache  Brief Hospital Course:  [name] is a 57 y.o. female who was admitted to the [facility] Teaching Service at [facility] for AMS. Hospital course is outlined below by system.    AMS Patient was found by son confused and called EMS who brought patient to the ED for altered mental status. LKW was at 9 AM per son.  Unknown cause of patient's altered mental status as initial labs with UDS/ETOH was negative, ammonia WNL, no significant electrolyte abnormality, head CT without any intracranial process.  In the ED patient was awake but having disorganized thought process, tangential answers and echolalia prompting recommendation for admission.  While patient was being prepped for admission, had a seizure and was given Ativan and was intubated for airway protection. Was also loaded with Keppra and sedated on propofol drip. Was promptly transferred to the ICU.  In the ICU, the patient was diagnosed with acute encephalopathy secondary to hypertensive emergency vs opiate withdrawal vs PRES. Cleviprex drip was added on 8/12 for blood pressure support and possible  PRES, which was later ruled out on MRI. Cleviprex was discontinued and home Cozaar 100mg  daily and Dilitazem 30mg  q6hr were started on 8/13 with good blood pressure control. Sedation was discontinued with stable blood pressures. Spironolactone 25mg  daily, coreg 12.5mg  BID and clonidine 0.2mg  BID were added.  She ultimately completed cessation of opioid medications.  Transferred back to FMTS after AMS resolved.  She was advised to stay for PT/OT needs however preferred going home to be with daughter-in-law and son.   Seizure Suspected to be secondary to hypertensive emergency.  In the ED patient had seizure and was treated with Ativan followed by loading dose of Keppra 3000 mg IV x 1. She was also started on thiamine. Brain MRI on 8/12 showed no acute abnormalities. Neurology was consulted and patient had continuous long-term EEG that showed no seizure activity. Believe seizure was secondary to hypertensive emergency, no need for AEDs.  PCP follow-up recommendations Needs Med rec F/u on TSH/T4 -recheck in 4 to 6 weeks Discuss starting SSRI instead of SNRI she was previously taking Recommend following up with pain medicine for pain needs  Disposition: home  Discharge Condition: stable  Discharge Exam:  Vitals:   09/13/22 1124 09/13/22 1609  BP: (!) 145/91 (!) 160/84  Pulse: 75 74  Resp: 16 15  Temp: 98.1 F (36.7 C) (!) 97.4 F (36.3 C)  SpO2: 95% 99%   General: Mild discomfort secondary to back pain Eyes: PERRL, EOMI ENTM: dry mucous membranes Cardiovascular: RRR Respiratory: CTAB Gastrointestinal: Normal bowel sounds all quadrants Neuro: 5/5 strength BUE and BLE. No focal weakness. No facial asymmetry. No tongue deviation. A&O x3.  Significant Procedures: none  Significant Labs  and Imaging:  No results for input(s): "WBC", "HGB", "HCT", "PLT" in the last 48 hours. Recent Labs  Lab 09/12/22 0518 09/13/22 0411  NA 142 139  K 3.4* 4.0  CL 107 102  CO2 26 25  GLUCOSE 133*  111*  BUN 15 21*  CREATININE 0.59 0.85  CALCIUM 9.2 9.8    MRI brain negative for acute finding, extensive chronic small vessel ischemia   Results/Tests Pending at Time of Discharge: none  Discharge Medications:  Allergies as of 09/13/2022       Reactions   Asa [aspirin] Other (See Comments)   Kidney disease   Methadose [methadone Hcl] Other (See Comments)   Syncopal event   Levaquin [levofloxacin] Itching        Medication List     STOP taking these medications    baclofen 10 MG tablet Commonly known as: LIORESAL   hydrOXYzine 10 MG tablet Commonly known as: ATARAX   oxyCODONE 5 MG immediate release tablet Commonly known as: Oxy IR/ROXICODONE   potassium chloride SA 20 MEQ tablet Commonly known as: KLOR-CON M   venlafaxine XR 75 MG 24 hr capsule Commonly known as: EFFEXOR-XR       TAKE these medications    acetaminophen 500 MG tablet Commonly known as: TYLENOL Take 1,000 mg by mouth 2 (two) times daily.   albuterol 108 (90 Base) MCG/ACT inhaler Commonly known as: VENTOLIN HFA INHALE TWO puffs into THE lungs EVERY SIX HOURS AS NEEDED wheezind/ For SHORTNESS OF BREATH related TO acute respiratory failure WITH hypoxia   ammonium lactate 12 % lotion Commonly known as: LAC-HYDRIN For dry skin What changed:  how much to take how to take this when to take this   atorvastatin 40 MG tablet Commonly known as: LIPITOR TAKE ONE TABLET BY MOUTH EVERY DAY   Belbuca 900 MCG Film Generic drug: Buprenorphine HCl Place 900 mcg inside cheek 2 (two) times daily.   busPIRone 10 MG tablet Commonly known as: BUSPAR TAKE ONE TABLET BY MOUTH THREE TIMES DAILY   carvedilol 12.5 MG tablet Commonly known as: COREG Take 1 tablet (12.5 mg total) by mouth 2 (two) times daily with a meal.   cloNIDine 0.2 MG tablet Commonly known as: CATAPRES Take 1 tablet (0.2 mg total) by mouth 2 (two) times daily. What changed:  medication strength how much to take    diclofenac Sodium 1 % Gel Commonly known as: VOLTAREN APPLY TWO grams topically FOUR TIMES DAILY What changed:  how much to take how to take this when to take this reasons to take this additional instructions   Dilt-XR 120 MG 24 hr capsule Generic drug: diltiazem TAKE ONE CAPSULE BY MOUTH TWICE DAILY related TO essential Primary hypertension   fluticasone 50 MCG/ACT nasal spray Commonly known as: FLONASE INSTILL TWO SPRAYS INTO BOTH NOSTRILS TWICE DAILY What changed:  how much to take how to take this when to take this   furosemide 80 MG tablet Commonly known as: LASIX TAKE ONE TABLET BY MOUTH TWICE DAILY related TO acute ON systolic (congestive heart failure)   gabapentin 300 MG capsule Commonly known as: NEURONTIN Take 300 mg by mouth 3 (three) times daily. What changed: Another medication with the same name was added. Make sure you understand how and when to take each.   gabapentin 100 MG capsule Commonly known as: NEURONTIN Take 2 capsules (200 mg total) by mouth 3 (three) times daily. What changed: You were already taking a medication with the same name, and  this prescription was added. Make sure you understand how and when to take each.   Jardiance 10 MG Tabs tablet Generic drug: empagliflozin give ONE tablet BY MOUTH EVERY DAY related TO type TWO diabetes mellitus WITH diabetic chronic kidney disease   lidocaine 5 % Commonly known as: LIDODERM Place 1 patch onto the skin daily. Remove & Discard patch within 12 hours or as directed by MD Start taking on: September 14, 2022   linaclotide 72 MCG capsule Commonly known as: LINZESS Take 72 mcg by mouth daily before breakfast.   loratadine 10 MG tablet Commonly known as: CLARITIN TAKE ONE TABLET BY MOUTH EVERY DAY FOR allergies   losartan 100 MG tablet Commonly known as: COZAAR Take 1 tablet (100 mg total) by mouth daily. Start taking on: September 14, 2022 What changed:  medication strength how much to  take when to take this   methocarbamol 500 MG tablet Commonly known as: ROBAXIN Take 500 mg by mouth every 6 (six) hours as needed for muscle spasms.   montelukast 10 MG tablet Commonly known as: SINGULAIR TAKE 1 TABLET BY MOUTH EVERYDAY AT BEDTIME Strength: 10 mg What changed:  how much to take how to take this when to take this additional instructions   Multivitamin Adults 50+ Tabs Take 1 tablet by mouth daily.   Narcan 4 MG/0.1ML Liqd nasal spray kit Generic drug: naloxone Place 0.4 mg into the nose once as needed (accidental overdose).   omeprazole 20 MG capsule Commonly known as: PRILOSEC Take 20 mg by mouth daily.   spironolactone 25 MG tablet Commonly known as: ALDACTONE Take 1 tablet (25 mg total) by mouth daily. What changed: See the new instructions.   Trelegy Ellipta 100-62.5-25 MCG/ACT Aepb Generic drug: Fluticasone-Umeclidin-Vilant Inhale 1 puff into the lungs daily.        Discharge Instructions: Please refer to Patient Instructions section of EMR for full details.  Patient was counseled important signs and symptoms that should prompt return to medical care, changes in medications, dietary instructions, activity restrictions, and follow up appointments.   Follow-Up Appointments: 8/23 at 2:30 with Dr. Arlys John, Kirstie, DO 09/13/2022, 5:09 PM PGY-1, [facility] Family Medicine   I was personally present and performed medical decision making activities of this service and have verified that the service and findings are accurately documented in the resident's note.  Shelby Mattocks, DO                  09/13/2022, 5:09 PM

## 2022-09-13 NOTE — Assessment & Plan Note (Deleted)
-   Continue cozaar, diltiazem, spironolactone, coreg, clonidine

## 2022-09-13 NOTE — Assessment & Plan Note (Signed)
Patient takes Effexor, BuSpar at home. -Will restart BuSpar -Asked patient if she has tried any other SSRIs, hesitant to restart Effexor due to hypertension

## 2022-09-13 NOTE — Progress Notes (Signed)
Daily Progress Note Intern Pager: [phone]  Patient name: [name] Medical record number: [id number] Date of birth: 1965-06-29 Age: 57 y.o. Gender: female  Primary Care Provider: Jerre Simon, MD Consultants: none Code Status: full  Pt Overview and Major Events to Date:  8/12 - admitted to ICU, cleviprex drip added 8/13 - Cleviprex was discontinued and home Cozaar 100mg  daily and Dilitazem 30mg  q6hr were started 8/14 - sedation discontinued, Spironolactone 25mg  added 8/15 - Coreg 12.5mg  BID and clonidine 0.2mg  BID were added  8/16 - pt transferred to FMTS, progressive   Assessment and Plan:  [name] is a 57 y.o. female presenting with AMS, seizure x1. Differential for presentation of this includes acute encephalopathy secondary to hypertensive emergency, opioid withdrawal. Pertinent PMH/PSH includes HTN, opioid use.  [facility]     * (Principal) AMS (altered mental status)     Resolved. Likely 2/2 hypertensive emergency - admit to FMTS, progressive,  - HIV, RPR negative.  B12, folate within normal limits. - avoid CNS acting medications  - MRI negative for acute change - repeat BMP  -Fall precaution -Neuro check Q4H -Carb diet -Levonox for VTE prophylaxis         Depression with anxiety     Patient takes Effexor, BuSpar at home. -Will restart BuSpar -Asked patient if she has tried any other SSRIs, hesitant to restart  Effexor due to hypertension        Chronic kidney disease (CKD), stage IV (severe) (HCC)     Creatinine now 0.85, GFR >60. Improved from 1.58 with a GFR of 37.   Baseline creatinine  around 1.1.          Polycythemia, secondary     Hgb of 16.5 improved from 19.4.  Suspect it was likely  hemoconcentration in the setting  of decreased p.o. intake vs hydration.   - Trend with daily CBC        Seizure (HCC)     Malnutrition of moderate degree     Hypertension     Chronic obstructive pulmonary disease (HCC)     Stable  with breathing treatments        Chronic diastolic HF (heart failure) (HCC)     Headache     -Patient has had a headache since leaving ICU -MRI of head negative for acute changes - trial Excedrin for headache       Chronic and Stable Problems: HLD, OSA, GERD, diastolic CHF, chronic back pain, T2DM   FEN/GI: carb diet PPx: lovenox Dispo:CIR in 2-3 days. Barriers include acceptance to CIR.   Subjective:  Feeling much better. More alert and oriented. Ready to get feeding tube out.   Objective: Temp:  [97.8 F (36.6 C)-98.5 F (36.9 C)] 98.1 F (36.7 C) (08/16 1124) Pulse Rate:  [73-89] 75 (08/16 1124) Resp:  [12-34] 16 (08/16 1124) BP: (126-176)/(56-155) 145/91 (08/16 1124) SpO2:  [91 %-96 %] 95 % (08/16 1124) FiO2 (%):  [28 %] 28 % (08/15 2000) Weight:  [107.5 kg] 107.5 kg (08/16 0500) Physical Exam: General: Uncomfortable with feeding tube, but pleasant Eyes: PERRL, EOMI ENTM: dry mucous membranes Cardiovascular: RRR Respiratory: CTAB Gastrointestinal: Normal bowel sounds all quadrants Neuro: 5/5 strength BUE and BLE. No focal weakness. No facial asymmetry. No tongue deviation. A&O x3.  Laboratory: Most recent CBC Lab Results  Component Value Date   WBC 11.2 (H) 09/11/2022   HGB 16.5 (H) 09/11/2022   HCT 48.7 (H)  09/11/2022   MCV 76.2 (L) 09/11/2022   PLT 201 09/11/2022   Most recent BMP    Latest Ref Rng & Units 09/13/2022    4:11 AM  BMP  Glucose 70 - 99 mg/dL 253   BUN 6 - 20 mg/dL 21   Creatinine 6.64 - 1.00 mg/dL 4.03   Sodium 474 - 259 mmol/L 139   Potassium 3.5 - 5.1 mmol/L 4.0   Chloride 98 - 111 mmol/L 102   CO2 22 - 32 mmol/L 25   Calcium 8.9 - 10.3 mg/dL 9.8     Other pertinent labs HIV negative, RPR negative, B12 wnl, folate wnl.    Imaging/Diagnostic Tests: No new imaging  Para March, DO 09/13/2022, 3:30 PM  PGY-1, [facility] Family Medicine FPTS Intern pager: [phone], text pages welcome Secure chat group [facility] Teaching Service

## 2022-09-13 NOTE — Assessment & Plan Note (Addendum)
Resolved. Likely 2/2 hypertensive emergency - admit to FMTS, progressive,  - HIV, RPR negative.  B12, folate within normal limits. - avoid CNS acting medications  - MRI negative for acute change - repeat BMP  -Fall precaution -Neuro check Q4H -Carb diet -Levonox for VTE prophylaxis

## 2022-09-13 NOTE — Progress Notes (Signed)
Discharged patient via wheelchair accompanied by her son. Discharge papers received. Home instructions explained by day shift nurse. Pt belongings taken with her.

## 2022-09-13 NOTE — Care Management Important Message (Signed)
Important Message  Patient Details  Name: [name] MRN: [id number] Date of Birth: [birth year]   Medicare Important Message Given:  Yes     Malaquias Lenker Stefan Church 09/13/2022, 3:03 PM

## 2022-09-16 ENCOUNTER — Telehealth: Payer: Self-pay

## 2022-09-16 NOTE — Transitions of Care (Post Inpatient/ED Visit) (Signed)
09/16/2022  Name: [name] MRN: [id number] DOB: [birth year]  Today's TOC FU Call Status: Today's TOC FU Call Status:: Successful TOC FU Call Completed TOC FU Call Complete Date: 09/16/22  Transition Care Management Follow-up Telephone Call Date of Discharge: 09/13/22 Discharge Facility: [facility]) Name of Other ([facility]) Discharge Facility: [facility] Type of Discharge: Inpatient Admission Primary Inpatient Discharge Diagnosis:: altered mental status How have you been since you were released from the hospital?: Better Any questions or concerns?: No  Items Reviewed: Medications obtained,verified, and reconciled?: Yes (Medications Reviewed) Any new allergies since your discharge?: No Dietary orders reviewed?: Yes Do you have support at home?: No  Medications Reviewed Today: Medications Reviewed Today     Reviewed by Karena Addison, LPN (Licensed Practical Nurse) on 09/16/22 at 1020  Med List Status: <None>   Medication Order Taking? Sig Documenting Provider Last Dose Status Informant  acetaminophen (TYLENOL) 500 MG tablet [id number] Yes Take 1,000 mg by mouth 2 (two) times daily. [provider] Taking Active Self, Pharmacy Records  albuterol (VENTOLIN HFA) 108 (90 Base) MCG/ACT inhaler [id number] Yes INHALE TWO puffs into THE lungs EVERY SIX HOURS AS NEEDED wheezind/ For SHORTNESS OF BREATH related TO acute respiratory failure WITH hypoxia Jerre Simon, MD Taking Active Self, Pharmacy Records  ammonium lactate (LAC-HYDRIN) 12 % lotion [id number] Yes For dry skin  Patient taking differently: Apply 1 Application topically 2 (two) times daily. For dry skin   Jerre Simon, MD Taking Active Self, Pharmacy Records  atorvastatin (LIPITOR) 40 MG tablet [id number] Yes TAKE ONE TABLET BY MOUTH EVERY DAY Jerre Simon, MD Taking Active Self, Pharmacy Records  Buprenorphine HCl (BELBUCA) 900 MCG FILM [id number] Yes Place 900 mcg inside cheek 2 (two) times daily. [provider] Taking Active Self, Pharmacy Records  busPIRone (BUSPAR) 10 MG tablet [id number] Yes TAKE ONE TABLET BY MOUTH THREE TIMES DAILY Jerre Simon, MD Taking Active Self, Pharmacy Records  carvedilol (COREG) 12.5 MG tablet [id number] Yes Take 1 tablet (12.5 mg total) by mouth 2 (two) times daily with a meal. Shelby Mattocks, DO Taking Active   cloNIDine (CATAPRES) 0.2 MG tablet [id number] Yes Take 1 tablet (0.2 mg total) by mouth 2 (two) times daily. Shelby Mattocks, DO Taking Active   diclofenac Sodium (VOLTAREN) 1 % GEL [id number] Yes APPLY TWO grams topically FOUR TIMES DAILY  Patient taking differently: Apply 2 g topically 4 (four) times daily as needed (pain).   Jerre Simon, MD Taking Active Self, Pharmacy Records  DILT-XR 120 MG 24 hr capsule [id number] Yes TAKE ONE CAPSULE BY MOUTH TWICE DAILY related TO essential Primary hypertension Jerre Simon, MD Taking Active Self, Pharmacy Records  fluticasone [facility]) 50 MCG/ACT nasal spray [id number] Yes INSTILL TWO SPRAYS INTO BOTH NOSTRILS TWICE DAILY  Patient taking differently: Place 2 sprays into both nostrils 2 (two) times daily. INSTILL TWO SPRAYS INTO BOTH NOSTRILS TWICE DAILY   Jerre Simon, MD Taking Active Self, Pharmacy Records  Fluticasone-Umeclidin[facility] ELLIPTA) 100-62.5-25 MCG/ACT AEPB [id number] Yes Inhale 1 puff into the lungs daily. Alver Sorrow, NP Taking Active Self, Pharmacy Records  furosemide (LASIX) 80 MG tablet [id number] Yes TAKE ONE TABLET BY MOUTH TWICE DAILY related TO acute ON systolic (congestive heart failure) Jerre Simon, MD Taking Active Self, Pharmacy Records  gabapentin (NEURONTIN) 100 MG capsule [id number] Yes Take 2 capsules (200 mg total) by mouth 3 (three) times daily. Shelby Mattocks, DO Taking Active   gabapentin (NEURONTIN) 300 MG capsule [id number] Yes Take 300 mg by mouth  3 (three) times daily. 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 Remove & Discard patch within 12 hours or as directed by MD Shelby Mattocks, DO Taking Active   linaclotide Karlene Einstein) 72 MCG capsule [id number] Yes Take 72 mcg by mouth daily before breakfast. [provider] Taking Active Self, Pharmacy Records  loratadine (CLARITIN) 10 MG tablet [id number] Yes TAKE ONE TABLET BY MOUTH EVERY DAY FOR allergies Jerre Simon, MD Taking Active Self, Pharmacy Records  losartan (COZAAR) 100 MG tablet [id number] Yes Take 1 tablet (100 mg total) by mouth daily. Shelby Mattocks, DO Taking Active   methocarbamol (ROBAXIN) 500 MG tablet [id number] Yes Take 500 mg by mouth every 6 (six) hours as needed for muscle spasms. [provider] Taking Active Self, Pharmacy Records  montelukast (SINGULAIR) 10 MG tablet [id number] Yes TAKE 1 TABLET BY MOUTH EVERYDAY AT BEDTIME Strength: 10 mg  Patient taking differently: Take 10 mg by mouth daily.   Jerre Simon, MD Taking Active Self, Pharmacy Records  Multiple Vitamins-Minerals (MULTIVITAMIN ADULTS 50+) TABS [id number] Yes Take 1 tablet by mouth daily. [provider] Taking Active Self, Pharmacy Records  [facility] 4 MG/0.1ML LIQD nasal spray kit [id number] Yes Place 0.4 mg into the nose once as needed (accidental overdose). [provider] Taking Active Self, Pharmacy Records           Med Note Gorden Harms   Sat Jun 30, 2021 10:54 AM)    omeprazole (PRILOSEC) 20 MG capsule [id number] Yes Take 20 mg by mouth daily. [provider] Taking Active Self, Pharmacy Records  spironolactone (ALDACTONE) 25 MG tablet [id number] Yes Take 1 tablet (25 mg total) by mouth daily. Shelby Mattocks, DO Taking Active             Home Care and  Equipment/Supplies: Were Home Health Services Ordered?: NA Any new equipment or medical supplies ordered?: NA  Functional Questionnaire: Do you need assistance with bathing/showering or dressing?: No Do you need assistance with meal preparation?: No Do you need assistance with eating?: No Do you have difficulty maintaining continence: No Do you need assistance with getting out of bed/getting out of a chair/moving?: No Do you have difficulty managing or taking your medications?: No  Follow up appointments reviewed: PCP Follow-up appointment confirmed?: Yes Date of PCP follow-up appointment?: 09/20/22 Follow-up Provider: [facility] Follow-up appointment confirmed?: NA Do you need transportation to your follow-up appointment?: No Do you understand care options if your condition(s) worsen?: Yes-patient verbalized understanding    SIGNATURE Karena Addison, LPN [facility] Nurse Health Advisor Direct Dial [phone]

## 2022-09-20 ENCOUNTER — Inpatient Hospital Stay: Payer: 59 | Admitting: Family Medicine

## 2022-09-23 ENCOUNTER — Other Ambulatory Visit: Payer: Self-pay | Admitting: Student

## 2022-09-23 DIAGNOSIS — I471 Supraventricular tachycardia, unspecified: Secondary | ICD-10-CM

## 2022-09-26 ENCOUNTER — Ambulatory Visit: Payer: 59 | Admitting: Orthopedic Surgery

## 2022-09-27 ENCOUNTER — Inpatient Hospital Stay: Payer: 59 | Admitting: Student

## 2022-10-02 ENCOUNTER — Other Ambulatory Visit: Payer: Self-pay | Admitting: Student

## 2022-10-02 MED ORDER — OMEPRAZOLE 20 MG PO CPDR: 20.0000 mg | DELAYED_RELEASE_CAPSULE | Freq: Every day | ORAL | 3 refills | Status: DC

## 2022-10-04 DIAGNOSIS — G8929 Other chronic pain: Secondary | ICD-10-CM | POA: Diagnosis not present

## 2022-10-04 DIAGNOSIS — I5033 Acute on chronic diastolic (congestive) heart failure: Secondary | ICD-10-CM | POA: Diagnosis not present

## 2022-10-04 DIAGNOSIS — M542 Cervicalgia: Secondary | ICD-10-CM | POA: Diagnosis not present

## 2022-10-04 DIAGNOSIS — N183 Chronic kidney disease, stage 3 unspecified: Secondary | ICD-10-CM | POA: Diagnosis not present

## 2022-10-04 DIAGNOSIS — I110 Hypertensive heart disease with heart failure: Secondary | ICD-10-CM | POA: Diagnosis not present

## 2022-10-04 DIAGNOSIS — M1711 Unilateral primary osteoarthritis, right knee: Secondary | ICD-10-CM | POA: Diagnosis not present

## 2022-10-04 DIAGNOSIS — I471 Supraventricular tachycardia, unspecified: Secondary | ICD-10-CM | POA: Diagnosis not present

## 2022-10-04 DIAGNOSIS — J4489 Other specified chronic obstructive pulmonary disease: Secondary | ICD-10-CM | POA: Diagnosis not present

## 2022-10-04 DIAGNOSIS — E1142 Type 2 diabetes mellitus with diabetic polyneuropathy: Secondary | ICD-10-CM | POA: Diagnosis not present

## 2022-10-04 DIAGNOSIS — R911 Solitary pulmonary nodule: Secondary | ICD-10-CM | POA: Diagnosis not present

## 2022-10-04 DIAGNOSIS — K5900 Constipation, unspecified: Secondary | ICD-10-CM | POA: Diagnosis not present

## 2022-10-04 DIAGNOSIS — J9611 Chronic respiratory failure with hypoxia: Secondary | ICD-10-CM | POA: Diagnosis not present

## 2022-10-04 DIAGNOSIS — G4733 Obstructive sleep apnea (adult) (pediatric): Secondary | ICD-10-CM | POA: Diagnosis not present

## 2022-10-04 DIAGNOSIS — M4800 Spinal stenosis, site unspecified: Secondary | ICD-10-CM | POA: Diagnosis not present

## 2022-10-04 DIAGNOSIS — I447 Left bundle-branch block, unspecified: Secondary | ICD-10-CM | POA: Diagnosis not present

## 2022-10-04 DIAGNOSIS — K219 Gastro-esophageal reflux disease without esophagitis: Secondary | ICD-10-CM | POA: Diagnosis not present

## 2022-10-04 DIAGNOSIS — E785 Hyperlipidemia, unspecified: Secondary | ICD-10-CM | POA: Diagnosis not present

## 2022-10-04 DIAGNOSIS — E1122 Type 2 diabetes mellitus with diabetic chronic kidney disease: Secondary | ICD-10-CM | POA: Diagnosis not present

## 2022-10-04 DIAGNOSIS — J302 Other seasonal allergic rhinitis: Secondary | ICD-10-CM | POA: Diagnosis not present

## 2022-10-04 DIAGNOSIS — M549 Dorsalgia, unspecified: Secondary | ICD-10-CM | POA: Diagnosis not present

## 2022-10-06 ENCOUNTER — Other Ambulatory Visit: Payer: Self-pay | Admitting: Student

## 2022-10-07 ENCOUNTER — Other Ambulatory Visit (HOSPITAL_COMMUNITY): Payer: Self-pay

## 2022-10-07 DIAGNOSIS — G8929 Other chronic pain: Secondary | ICD-10-CM | POA: Diagnosis not present

## 2022-10-07 DIAGNOSIS — R911 Solitary pulmonary nodule: Secondary | ICD-10-CM | POA: Diagnosis not present

## 2022-10-07 DIAGNOSIS — J9611 Chronic respiratory failure with hypoxia: Secondary | ICD-10-CM | POA: Diagnosis not present

## 2022-10-07 DIAGNOSIS — I447 Left bundle-branch block, unspecified: Secondary | ICD-10-CM | POA: Diagnosis not present

## 2022-10-07 DIAGNOSIS — K219 Gastro-esophageal reflux disease without esophagitis: Secondary | ICD-10-CM | POA: Diagnosis not present

## 2022-10-07 DIAGNOSIS — J4489 Other specified chronic obstructive pulmonary disease: Secondary | ICD-10-CM | POA: Diagnosis not present

## 2022-10-07 DIAGNOSIS — M542 Cervicalgia: Secondary | ICD-10-CM | POA: Diagnosis not present

## 2022-10-07 DIAGNOSIS — E1122 Type 2 diabetes mellitus with diabetic chronic kidney disease: Secondary | ICD-10-CM | POA: Diagnosis not present

## 2022-10-07 DIAGNOSIS — I5033 Acute on chronic diastolic (congestive) heart failure: Secondary | ICD-10-CM | POA: Diagnosis not present

## 2022-10-07 DIAGNOSIS — K5900 Constipation, unspecified: Secondary | ICD-10-CM | POA: Diagnosis not present

## 2022-10-07 DIAGNOSIS — I471 Supraventricular tachycardia, unspecified: Secondary | ICD-10-CM | POA: Diagnosis not present

## 2022-10-07 DIAGNOSIS — M549 Dorsalgia, unspecified: Secondary | ICD-10-CM | POA: Diagnosis not present

## 2022-10-07 DIAGNOSIS — E785 Hyperlipidemia, unspecified: Secondary | ICD-10-CM | POA: Diagnosis not present

## 2022-10-07 DIAGNOSIS — E1142 Type 2 diabetes mellitus with diabetic polyneuropathy: Secondary | ICD-10-CM | POA: Diagnosis not present

## 2022-10-07 DIAGNOSIS — M1711 Unilateral primary osteoarthritis, right knee: Secondary | ICD-10-CM | POA: Diagnosis not present

## 2022-10-07 DIAGNOSIS — I110 Hypertensive heart disease with heart failure: Secondary | ICD-10-CM | POA: Diagnosis not present

## 2022-10-07 DIAGNOSIS — N183 Chronic kidney disease, stage 3 unspecified: Secondary | ICD-10-CM | POA: Diagnosis not present

## 2022-10-07 DIAGNOSIS — G4733 Obstructive sleep apnea (adult) (pediatric): Secondary | ICD-10-CM | POA: Diagnosis not present

## 2022-10-07 DIAGNOSIS — M4800 Spinal stenosis, site unspecified: Secondary | ICD-10-CM | POA: Diagnosis not present

## 2022-10-07 DIAGNOSIS — J302 Other seasonal allergic rhinitis: Secondary | ICD-10-CM | POA: Diagnosis not present

## 2022-10-07 MED ORDER — CARVEDILOL 12.5 MG PO TABS
12.5000 mg | ORAL_TABLET | Freq: Two times a day (BID) | ORAL | 0 refills | Status: DC
  Filled 2022-10-07: qty 60, 30d supply, fill #0

## 2022-10-08 ENCOUNTER — Other Ambulatory Visit: Payer: Self-pay

## 2022-10-08 DIAGNOSIS — R911 Solitary pulmonary nodule: Secondary | ICD-10-CM | POA: Diagnosis not present

## 2022-10-08 DIAGNOSIS — M4800 Spinal stenosis, site unspecified: Secondary | ICD-10-CM | POA: Diagnosis not present

## 2022-10-08 DIAGNOSIS — E785 Hyperlipidemia, unspecified: Secondary | ICD-10-CM | POA: Diagnosis not present

## 2022-10-08 DIAGNOSIS — I110 Hypertensive heart disease with heart failure: Secondary | ICD-10-CM | POA: Diagnosis not present

## 2022-10-08 DIAGNOSIS — E1122 Type 2 diabetes mellitus with diabetic chronic kidney disease: Secondary | ICD-10-CM | POA: Diagnosis not present

## 2022-10-08 DIAGNOSIS — J4489 Other specified chronic obstructive pulmonary disease: Secondary | ICD-10-CM | POA: Diagnosis not present

## 2022-10-08 DIAGNOSIS — I447 Left bundle-branch block, unspecified: Secondary | ICD-10-CM | POA: Diagnosis not present

## 2022-10-08 DIAGNOSIS — M542 Cervicalgia: Secondary | ICD-10-CM | POA: Diagnosis not present

## 2022-10-08 DIAGNOSIS — K5900 Constipation, unspecified: Secondary | ICD-10-CM | POA: Diagnosis not present

## 2022-10-08 DIAGNOSIS — M549 Dorsalgia, unspecified: Secondary | ICD-10-CM | POA: Diagnosis not present

## 2022-10-08 DIAGNOSIS — M1711 Unilateral primary osteoarthritis, right knee: Secondary | ICD-10-CM | POA: Diagnosis not present

## 2022-10-08 DIAGNOSIS — N183 Chronic kidney disease, stage 3 unspecified: Secondary | ICD-10-CM | POA: Diagnosis not present

## 2022-10-08 DIAGNOSIS — E1142 Type 2 diabetes mellitus with diabetic polyneuropathy: Secondary | ICD-10-CM | POA: Diagnosis not present

## 2022-10-08 DIAGNOSIS — G8929 Other chronic pain: Secondary | ICD-10-CM | POA: Diagnosis not present

## 2022-10-08 DIAGNOSIS — I5033 Acute on chronic diastolic (congestive) heart failure: Secondary | ICD-10-CM | POA: Diagnosis not present

## 2022-10-08 DIAGNOSIS — J302 Other seasonal allergic rhinitis: Secondary | ICD-10-CM | POA: Diagnosis not present

## 2022-10-08 DIAGNOSIS — J9611 Chronic respiratory failure with hypoxia: Secondary | ICD-10-CM | POA: Diagnosis not present

## 2022-10-08 DIAGNOSIS — I471 Supraventricular tachycardia, unspecified: Secondary | ICD-10-CM | POA: Diagnosis not present

## 2022-10-08 DIAGNOSIS — K219 Gastro-esophageal reflux disease without esophagitis: Secondary | ICD-10-CM | POA: Diagnosis not present

## 2022-10-08 DIAGNOSIS — G4733 Obstructive sleep apnea (adult) (pediatric): Secondary | ICD-10-CM | POA: Diagnosis not present

## 2022-10-10 ENCOUNTER — Other Ambulatory Visit: Payer: Self-pay | Admitting: Student

## 2022-10-10 ENCOUNTER — Other Ambulatory Visit (HOSPITAL_COMMUNITY): Payer: Self-pay

## 2022-10-10 DIAGNOSIS — I110 Hypertensive heart disease with heart failure: Secondary | ICD-10-CM | POA: Diagnosis not present

## 2022-10-10 DIAGNOSIS — M542 Cervicalgia: Secondary | ICD-10-CM | POA: Diagnosis not present

## 2022-10-10 DIAGNOSIS — M4800 Spinal stenosis, site unspecified: Secondary | ICD-10-CM | POA: Diagnosis not present

## 2022-10-10 DIAGNOSIS — I447 Left bundle-branch block, unspecified: Secondary | ICD-10-CM | POA: Diagnosis not present

## 2022-10-10 DIAGNOSIS — M1711 Unilateral primary osteoarthritis, right knee: Secondary | ICD-10-CM | POA: Diagnosis not present

## 2022-10-10 DIAGNOSIS — J4489 Other specified chronic obstructive pulmonary disease: Secondary | ICD-10-CM | POA: Diagnosis not present

## 2022-10-10 DIAGNOSIS — J9611 Chronic respiratory failure with hypoxia: Secondary | ICD-10-CM | POA: Diagnosis not present

## 2022-10-10 DIAGNOSIS — K219 Gastro-esophageal reflux disease without esophagitis: Secondary | ICD-10-CM | POA: Diagnosis not present

## 2022-10-10 DIAGNOSIS — E785 Hyperlipidemia, unspecified: Secondary | ICD-10-CM | POA: Diagnosis not present

## 2022-10-10 DIAGNOSIS — J302 Other seasonal allergic rhinitis: Secondary | ICD-10-CM | POA: Diagnosis not present

## 2022-10-10 DIAGNOSIS — I471 Supraventricular tachycardia, unspecified: Secondary | ICD-10-CM | POA: Diagnosis not present

## 2022-10-10 DIAGNOSIS — R911 Solitary pulmonary nodule: Secondary | ICD-10-CM | POA: Diagnosis not present

## 2022-10-10 DIAGNOSIS — G8929 Other chronic pain: Secondary | ICD-10-CM | POA: Diagnosis not present

## 2022-10-10 DIAGNOSIS — G4733 Obstructive sleep apnea (adult) (pediatric): Secondary | ICD-10-CM | POA: Diagnosis not present

## 2022-10-10 DIAGNOSIS — N183 Chronic kidney disease, stage 3 unspecified: Secondary | ICD-10-CM | POA: Diagnosis not present

## 2022-10-10 DIAGNOSIS — E1142 Type 2 diabetes mellitus with diabetic polyneuropathy: Secondary | ICD-10-CM | POA: Diagnosis not present

## 2022-10-10 DIAGNOSIS — M549 Dorsalgia, unspecified: Secondary | ICD-10-CM | POA: Diagnosis not present

## 2022-10-10 DIAGNOSIS — K5900 Constipation, unspecified: Secondary | ICD-10-CM | POA: Diagnosis not present

## 2022-10-10 DIAGNOSIS — E1122 Type 2 diabetes mellitus with diabetic chronic kidney disease: Secondary | ICD-10-CM | POA: Diagnosis not present

## 2022-10-10 DIAGNOSIS — I5033 Acute on chronic diastolic (congestive) heart failure: Secondary | ICD-10-CM | POA: Diagnosis not present

## 2022-10-10 DIAGNOSIS — I5032 Chronic diastolic (congestive) heart failure: Secondary | ICD-10-CM

## 2022-10-10 MED ORDER — GABAPENTIN 100 MG PO CAPS
200.0000 mg | ORAL_CAPSULE | Freq: Three times a day (TID) | ORAL | 0 refills | Status: DC
  Filled 2022-10-10: qty 180, 30d supply, fill #0

## 2022-10-10 MED ORDER — LOSARTAN POTASSIUM 100 MG PO TABS
100.0000 mg | ORAL_TABLET | Freq: Every day | ORAL | 0 refills | Status: DC
  Filled 2022-10-10: qty 30, 30d supply, fill #0

## 2022-10-10 MED ORDER — SPIRONOLACTONE 25 MG PO TABS
25.0000 mg | ORAL_TABLET | Freq: Every day | ORAL | 0 refills | Status: DC
Start: 2022-10-10 — End: 2022-12-06
  Filled 2022-10-10: qty 30, 30d supply, fill #0

## 2022-10-14 DIAGNOSIS — M549 Dorsalgia, unspecified: Secondary | ICD-10-CM | POA: Diagnosis not present

## 2022-10-14 DIAGNOSIS — G4733 Obstructive sleep apnea (adult) (pediatric): Secondary | ICD-10-CM | POA: Diagnosis not present

## 2022-10-14 DIAGNOSIS — J4489 Other specified chronic obstructive pulmonary disease: Secondary | ICD-10-CM | POA: Diagnosis not present

## 2022-10-14 DIAGNOSIS — K219 Gastro-esophageal reflux disease without esophagitis: Secondary | ICD-10-CM | POA: Diagnosis not present

## 2022-10-14 DIAGNOSIS — I5033 Acute on chronic diastolic (congestive) heart failure: Secondary | ICD-10-CM | POA: Diagnosis not present

## 2022-10-14 DIAGNOSIS — I110 Hypertensive heart disease with heart failure: Secondary | ICD-10-CM | POA: Diagnosis not present

## 2022-10-14 DIAGNOSIS — R911 Solitary pulmonary nodule: Secondary | ICD-10-CM | POA: Diagnosis not present

## 2022-10-14 DIAGNOSIS — E1142 Type 2 diabetes mellitus with diabetic polyneuropathy: Secondary | ICD-10-CM | POA: Diagnosis not present

## 2022-10-14 DIAGNOSIS — I471 Supraventricular tachycardia, unspecified: Secondary | ICD-10-CM | POA: Diagnosis not present

## 2022-10-14 DIAGNOSIS — G8929 Other chronic pain: Secondary | ICD-10-CM | POA: Diagnosis not present

## 2022-10-14 DIAGNOSIS — I447 Left bundle-branch block, unspecified: Secondary | ICD-10-CM | POA: Diagnosis not present

## 2022-10-14 DIAGNOSIS — J9611 Chronic respiratory failure with hypoxia: Secondary | ICD-10-CM | POA: Diagnosis not present

## 2022-10-14 DIAGNOSIS — E1122 Type 2 diabetes mellitus with diabetic chronic kidney disease: Secondary | ICD-10-CM | POA: Diagnosis not present

## 2022-10-14 DIAGNOSIS — N183 Chronic kidney disease, stage 3 unspecified: Secondary | ICD-10-CM | POA: Diagnosis not present

## 2022-10-14 DIAGNOSIS — M542 Cervicalgia: Secondary | ICD-10-CM | POA: Diagnosis not present

## 2022-10-14 DIAGNOSIS — M4800 Spinal stenosis, site unspecified: Secondary | ICD-10-CM | POA: Diagnosis not present

## 2022-10-14 DIAGNOSIS — M1711 Unilateral primary osteoarthritis, right knee: Secondary | ICD-10-CM | POA: Diagnosis not present

## 2022-10-14 DIAGNOSIS — J302 Other seasonal allergic rhinitis: Secondary | ICD-10-CM | POA: Diagnosis not present

## 2022-10-14 DIAGNOSIS — E785 Hyperlipidemia, unspecified: Secondary | ICD-10-CM | POA: Diagnosis not present

## 2022-10-14 DIAGNOSIS — K5900 Constipation, unspecified: Secondary | ICD-10-CM | POA: Diagnosis not present

## 2022-10-16 DIAGNOSIS — G8929 Other chronic pain: Secondary | ICD-10-CM | POA: Diagnosis not present

## 2022-10-16 DIAGNOSIS — R911 Solitary pulmonary nodule: Secondary | ICD-10-CM | POA: Diagnosis not present

## 2022-10-16 DIAGNOSIS — I471 Supraventricular tachycardia, unspecified: Secondary | ICD-10-CM | POA: Diagnosis not present

## 2022-10-16 DIAGNOSIS — J4489 Other specified chronic obstructive pulmonary disease: Secondary | ICD-10-CM | POA: Diagnosis not present

## 2022-10-16 DIAGNOSIS — J9611 Chronic respiratory failure with hypoxia: Secondary | ICD-10-CM | POA: Diagnosis not present

## 2022-10-16 DIAGNOSIS — E1122 Type 2 diabetes mellitus with diabetic chronic kidney disease: Secondary | ICD-10-CM | POA: Diagnosis not present

## 2022-10-16 DIAGNOSIS — K219 Gastro-esophageal reflux disease without esophagitis: Secondary | ICD-10-CM | POA: Diagnosis not present

## 2022-10-16 DIAGNOSIS — E785 Hyperlipidemia, unspecified: Secondary | ICD-10-CM | POA: Diagnosis not present

## 2022-10-16 DIAGNOSIS — K5900 Constipation, unspecified: Secondary | ICD-10-CM | POA: Diagnosis not present

## 2022-10-16 DIAGNOSIS — N183 Chronic kidney disease, stage 3 unspecified: Secondary | ICD-10-CM | POA: Diagnosis not present

## 2022-10-16 DIAGNOSIS — M4800 Spinal stenosis, site unspecified: Secondary | ICD-10-CM | POA: Diagnosis not present

## 2022-10-16 DIAGNOSIS — J302 Other seasonal allergic rhinitis: Secondary | ICD-10-CM | POA: Diagnosis not present

## 2022-10-16 DIAGNOSIS — M1711 Unilateral primary osteoarthritis, right knee: Secondary | ICD-10-CM | POA: Diagnosis not present

## 2022-10-16 DIAGNOSIS — M542 Cervicalgia: Secondary | ICD-10-CM | POA: Diagnosis not present

## 2022-10-16 DIAGNOSIS — E1142 Type 2 diabetes mellitus with diabetic polyneuropathy: Secondary | ICD-10-CM | POA: Diagnosis not present

## 2022-10-16 DIAGNOSIS — G4733 Obstructive sleep apnea (adult) (pediatric): Secondary | ICD-10-CM | POA: Diagnosis not present

## 2022-10-16 DIAGNOSIS — M549 Dorsalgia, unspecified: Secondary | ICD-10-CM | POA: Diagnosis not present

## 2022-10-16 DIAGNOSIS — I447 Left bundle-branch block, unspecified: Secondary | ICD-10-CM | POA: Diagnosis not present

## 2022-10-16 DIAGNOSIS — I110 Hypertensive heart disease with heart failure: Secondary | ICD-10-CM | POA: Diagnosis not present

## 2022-10-16 DIAGNOSIS — I5033 Acute on chronic diastolic (congestive) heart failure: Secondary | ICD-10-CM | POA: Diagnosis not present

## 2022-10-17 ENCOUNTER — Other Ambulatory Visit: Payer: Self-pay | Admitting: Student

## 2022-10-17 ENCOUNTER — Ambulatory Visit: Payer: 59 | Admitting: Orthopedic Surgery

## 2022-10-17 DIAGNOSIS — J4489 Other specified chronic obstructive pulmonary disease: Secondary | ICD-10-CM

## 2022-10-18 DIAGNOSIS — R911 Solitary pulmonary nodule: Secondary | ICD-10-CM | POA: Diagnosis not present

## 2022-10-18 DIAGNOSIS — M4800 Spinal stenosis, site unspecified: Secondary | ICD-10-CM | POA: Diagnosis not present

## 2022-10-18 DIAGNOSIS — G4733 Obstructive sleep apnea (adult) (pediatric): Secondary | ICD-10-CM | POA: Diagnosis not present

## 2022-10-18 DIAGNOSIS — M542 Cervicalgia: Secondary | ICD-10-CM | POA: Diagnosis not present

## 2022-10-18 DIAGNOSIS — K219 Gastro-esophageal reflux disease without esophagitis: Secondary | ICD-10-CM | POA: Diagnosis not present

## 2022-10-18 DIAGNOSIS — G8929 Other chronic pain: Secondary | ICD-10-CM | POA: Diagnosis not present

## 2022-10-18 DIAGNOSIS — J4489 Other specified chronic obstructive pulmonary disease: Secondary | ICD-10-CM | POA: Diagnosis not present

## 2022-10-18 DIAGNOSIS — E785 Hyperlipidemia, unspecified: Secondary | ICD-10-CM | POA: Diagnosis not present

## 2022-10-18 DIAGNOSIS — I471 Supraventricular tachycardia, unspecified: Secondary | ICD-10-CM | POA: Diagnosis not present

## 2022-10-18 DIAGNOSIS — E1142 Type 2 diabetes mellitus with diabetic polyneuropathy: Secondary | ICD-10-CM | POA: Diagnosis not present

## 2022-10-18 DIAGNOSIS — M1711 Unilateral primary osteoarthritis, right knee: Secondary | ICD-10-CM | POA: Diagnosis not present

## 2022-10-18 DIAGNOSIS — N183 Chronic kidney disease, stage 3 unspecified: Secondary | ICD-10-CM | POA: Diagnosis not present

## 2022-10-18 DIAGNOSIS — J9611 Chronic respiratory failure with hypoxia: Secondary | ICD-10-CM | POA: Diagnosis not present

## 2022-10-18 DIAGNOSIS — I5033 Acute on chronic diastolic (congestive) heart failure: Secondary | ICD-10-CM | POA: Diagnosis not present

## 2022-10-18 DIAGNOSIS — I447 Left bundle-branch block, unspecified: Secondary | ICD-10-CM | POA: Diagnosis not present

## 2022-10-18 DIAGNOSIS — J302 Other seasonal allergic rhinitis: Secondary | ICD-10-CM | POA: Diagnosis not present

## 2022-10-18 DIAGNOSIS — I110 Hypertensive heart disease with heart failure: Secondary | ICD-10-CM | POA: Diagnosis not present

## 2022-10-18 DIAGNOSIS — K5900 Constipation, unspecified: Secondary | ICD-10-CM | POA: Diagnosis not present

## 2022-10-18 DIAGNOSIS — M549 Dorsalgia, unspecified: Secondary | ICD-10-CM | POA: Diagnosis not present

## 2022-10-18 DIAGNOSIS — E1122 Type 2 diabetes mellitus with diabetic chronic kidney disease: Secondary | ICD-10-CM | POA: Diagnosis not present

## 2022-10-21 DIAGNOSIS — I471 Supraventricular tachycardia, unspecified: Secondary | ICD-10-CM | POA: Diagnosis not present

## 2022-10-21 DIAGNOSIS — E785 Hyperlipidemia, unspecified: Secondary | ICD-10-CM | POA: Diagnosis not present

## 2022-10-21 DIAGNOSIS — K219 Gastro-esophageal reflux disease without esophagitis: Secondary | ICD-10-CM | POA: Diagnosis not present

## 2022-10-21 DIAGNOSIS — J9611 Chronic respiratory failure with hypoxia: Secondary | ICD-10-CM | POA: Diagnosis not present

## 2022-10-21 DIAGNOSIS — E1142 Type 2 diabetes mellitus with diabetic polyneuropathy: Secondary | ICD-10-CM | POA: Diagnosis not present

## 2022-10-21 DIAGNOSIS — R911 Solitary pulmonary nodule: Secondary | ICD-10-CM | POA: Diagnosis not present

## 2022-10-21 DIAGNOSIS — I447 Left bundle-branch block, unspecified: Secondary | ICD-10-CM | POA: Diagnosis not present

## 2022-10-21 DIAGNOSIS — E1122 Type 2 diabetes mellitus with diabetic chronic kidney disease: Secondary | ICD-10-CM | POA: Diagnosis not present

## 2022-10-21 DIAGNOSIS — J4489 Other specified chronic obstructive pulmonary disease: Secondary | ICD-10-CM | POA: Diagnosis not present

## 2022-10-21 DIAGNOSIS — M1711 Unilateral primary osteoarthritis, right knee: Secondary | ICD-10-CM | POA: Diagnosis not present

## 2022-10-21 DIAGNOSIS — M549 Dorsalgia, unspecified: Secondary | ICD-10-CM | POA: Diagnosis not present

## 2022-10-21 DIAGNOSIS — M4800 Spinal stenosis, site unspecified: Secondary | ICD-10-CM | POA: Diagnosis not present

## 2022-10-21 DIAGNOSIS — G8929 Other chronic pain: Secondary | ICD-10-CM | POA: Diagnosis not present

## 2022-10-21 DIAGNOSIS — J302 Other seasonal allergic rhinitis: Secondary | ICD-10-CM | POA: Diagnosis not present

## 2022-10-21 DIAGNOSIS — I110 Hypertensive heart disease with heart failure: Secondary | ICD-10-CM | POA: Diagnosis not present

## 2022-10-21 DIAGNOSIS — I5033 Acute on chronic diastolic (congestive) heart failure: Secondary | ICD-10-CM | POA: Diagnosis not present

## 2022-10-21 DIAGNOSIS — K5900 Constipation, unspecified: Secondary | ICD-10-CM | POA: Diagnosis not present

## 2022-10-21 DIAGNOSIS — M542 Cervicalgia: Secondary | ICD-10-CM | POA: Diagnosis not present

## 2022-10-21 DIAGNOSIS — G4733 Obstructive sleep apnea (adult) (pediatric): Secondary | ICD-10-CM | POA: Diagnosis not present

## 2022-10-21 DIAGNOSIS — N183 Chronic kidney disease, stage 3 unspecified: Secondary | ICD-10-CM | POA: Diagnosis not present

## 2022-10-22 ENCOUNTER — Other Ambulatory Visit: Payer: Self-pay | Admitting: Student

## 2022-10-23 DIAGNOSIS — J302 Other seasonal allergic rhinitis: Secondary | ICD-10-CM | POA: Diagnosis not present

## 2022-10-23 DIAGNOSIS — K5900 Constipation, unspecified: Secondary | ICD-10-CM | POA: Diagnosis not present

## 2022-10-23 DIAGNOSIS — I110 Hypertensive heart disease with heart failure: Secondary | ICD-10-CM | POA: Diagnosis not present

## 2022-10-23 DIAGNOSIS — G4733 Obstructive sleep apnea (adult) (pediatric): Secondary | ICD-10-CM | POA: Diagnosis not present

## 2022-10-23 DIAGNOSIS — I5033 Acute on chronic diastolic (congestive) heart failure: Secondary | ICD-10-CM | POA: Diagnosis not present

## 2022-10-23 DIAGNOSIS — M4800 Spinal stenosis, site unspecified: Secondary | ICD-10-CM | POA: Diagnosis not present

## 2022-10-23 DIAGNOSIS — J4489 Other specified chronic obstructive pulmonary disease: Secondary | ICD-10-CM | POA: Diagnosis not present

## 2022-10-23 DIAGNOSIS — I471 Supraventricular tachycardia, unspecified: Secondary | ICD-10-CM | POA: Diagnosis not present

## 2022-10-23 DIAGNOSIS — R911 Solitary pulmonary nodule: Secondary | ICD-10-CM | POA: Diagnosis not present

## 2022-10-23 DIAGNOSIS — J9611 Chronic respiratory failure with hypoxia: Secondary | ICD-10-CM | POA: Diagnosis not present

## 2022-10-23 DIAGNOSIS — E785 Hyperlipidemia, unspecified: Secondary | ICD-10-CM | POA: Diagnosis not present

## 2022-10-23 DIAGNOSIS — I447 Left bundle-branch block, unspecified: Secondary | ICD-10-CM | POA: Diagnosis not present

## 2022-10-23 DIAGNOSIS — K219 Gastro-esophageal reflux disease without esophagitis: Secondary | ICD-10-CM | POA: Diagnosis not present

## 2022-10-23 DIAGNOSIS — G8929 Other chronic pain: Secondary | ICD-10-CM | POA: Diagnosis not present

## 2022-10-23 DIAGNOSIS — M549 Dorsalgia, unspecified: Secondary | ICD-10-CM | POA: Diagnosis not present

## 2022-10-23 DIAGNOSIS — M542 Cervicalgia: Secondary | ICD-10-CM | POA: Diagnosis not present

## 2022-10-23 DIAGNOSIS — E1142 Type 2 diabetes mellitus with diabetic polyneuropathy: Secondary | ICD-10-CM | POA: Diagnosis not present

## 2022-10-23 DIAGNOSIS — N183 Chronic kidney disease, stage 3 unspecified: Secondary | ICD-10-CM | POA: Diagnosis not present

## 2022-10-23 DIAGNOSIS — E1122 Type 2 diabetes mellitus with diabetic chronic kidney disease: Secondary | ICD-10-CM | POA: Diagnosis not present

## 2022-10-23 DIAGNOSIS — M1711 Unilateral primary osteoarthritis, right knee: Secondary | ICD-10-CM | POA: Diagnosis not present

## 2022-10-24 ENCOUNTER — Other Ambulatory Visit: Payer: Self-pay | Admitting: Student

## 2022-10-24 MED ORDER — CARVEDILOL 12.5 MG PO TABS
12.5000 mg | ORAL_TABLET | Freq: Two times a day (BID) | ORAL | 0 refills | Status: DC
  Filled 2022-10-24 – 2022-12-25 (×2): qty 60, 30d supply, fill #0

## 2022-10-25 ENCOUNTER — Other Ambulatory Visit: Payer: Self-pay | Admitting: Student

## 2022-10-25 ENCOUNTER — Other Ambulatory Visit (HOSPITAL_COMMUNITY): Payer: Self-pay

## 2022-10-28 ENCOUNTER — Ambulatory Visit: Payer: 59 | Admitting: Student

## 2022-10-29 ENCOUNTER — Other Ambulatory Visit: Payer: Self-pay | Admitting: Student

## 2022-10-29 DIAGNOSIS — N183 Chronic kidney disease, stage 3 unspecified: Secondary | ICD-10-CM | POA: Diagnosis not present

## 2022-10-29 DIAGNOSIS — J9611 Chronic respiratory failure with hypoxia: Secondary | ICD-10-CM | POA: Diagnosis not present

## 2022-10-29 DIAGNOSIS — M542 Cervicalgia: Secondary | ICD-10-CM | POA: Diagnosis not present

## 2022-10-29 DIAGNOSIS — M1611 Unilateral primary osteoarthritis, right hip: Secondary | ICD-10-CM

## 2022-10-29 DIAGNOSIS — J4489 Other specified chronic obstructive pulmonary disease: Secondary | ICD-10-CM | POA: Diagnosis not present

## 2022-10-29 DIAGNOSIS — K5900 Constipation, unspecified: Secondary | ICD-10-CM | POA: Diagnosis not present

## 2022-10-29 DIAGNOSIS — E1142 Type 2 diabetes mellitus with diabetic polyneuropathy: Secondary | ICD-10-CM | POA: Diagnosis not present

## 2022-10-29 DIAGNOSIS — I447 Left bundle-branch block, unspecified: Secondary | ICD-10-CM | POA: Diagnosis not present

## 2022-10-29 DIAGNOSIS — I110 Hypertensive heart disease with heart failure: Secondary | ICD-10-CM | POA: Diagnosis not present

## 2022-10-29 DIAGNOSIS — E1122 Type 2 diabetes mellitus with diabetic chronic kidney disease: Secondary | ICD-10-CM | POA: Diagnosis not present

## 2022-10-29 DIAGNOSIS — R911 Solitary pulmonary nodule: Secondary | ICD-10-CM | POA: Diagnosis not present

## 2022-10-29 DIAGNOSIS — M1711 Unilateral primary osteoarthritis, right knee: Secondary | ICD-10-CM | POA: Diagnosis not present

## 2022-10-29 DIAGNOSIS — G4733 Obstructive sleep apnea (adult) (pediatric): Secondary | ICD-10-CM | POA: Diagnosis not present

## 2022-10-29 DIAGNOSIS — I471 Supraventricular tachycardia, unspecified: Secondary | ICD-10-CM | POA: Diagnosis not present

## 2022-10-29 DIAGNOSIS — G8929 Other chronic pain: Secondary | ICD-10-CM | POA: Diagnosis not present

## 2022-10-29 DIAGNOSIS — E785 Hyperlipidemia, unspecified: Secondary | ICD-10-CM | POA: Diagnosis not present

## 2022-10-29 DIAGNOSIS — J302 Other seasonal allergic rhinitis: Secondary | ICD-10-CM | POA: Diagnosis not present

## 2022-10-29 DIAGNOSIS — M549 Dorsalgia, unspecified: Secondary | ICD-10-CM | POA: Diagnosis not present

## 2022-10-29 DIAGNOSIS — M4800 Spinal stenosis, site unspecified: Secondary | ICD-10-CM | POA: Diagnosis not present

## 2022-10-29 DIAGNOSIS — K219 Gastro-esophageal reflux disease without esophagitis: Secondary | ICD-10-CM | POA: Diagnosis not present

## 2022-10-29 DIAGNOSIS — I5033 Acute on chronic diastolic (congestive) heart failure: Secondary | ICD-10-CM | POA: Diagnosis not present

## 2022-10-29 NOTE — Progress Notes (Signed)
Referral to Pain clinic placed. Patient has had difficulty with placement to pain clinic but reached out and expressed preference to Dr. Riley Kill who could manage her chronic pain.

## 2022-11-05 ENCOUNTER — Other Ambulatory Visit: Payer: Self-pay | Admitting: Student

## 2022-11-06 ENCOUNTER — Ambulatory Visit: Payer: 59 | Admitting: Student

## 2022-11-08 ENCOUNTER — Other Ambulatory Visit: Payer: Self-pay | Admitting: Student

## 2022-11-11 ENCOUNTER — Other Ambulatory Visit: Payer: Self-pay | Admitting: Student

## 2022-11-12 ENCOUNTER — Other Ambulatory Visit: Payer: Self-pay | Admitting: Student

## 2022-11-12 ENCOUNTER — Ambulatory Visit: Payer: 59 | Admitting: Student

## 2022-11-12 DIAGNOSIS — J4489 Other specified chronic obstructive pulmonary disease: Secondary | ICD-10-CM

## 2022-11-14 ENCOUNTER — Encounter: Payer: Self-pay | Admitting: Physical Medicine & Rehabilitation

## 2022-11-14 ENCOUNTER — Ambulatory Visit: Payer: 59 | Admitting: Student

## 2022-11-14 ENCOUNTER — Other Ambulatory Visit: Payer: Self-pay | Admitting: Student

## 2022-11-25 ENCOUNTER — Other Ambulatory Visit: Payer: Self-pay

## 2022-11-25 ENCOUNTER — Other Ambulatory Visit (HOSPITAL_COMMUNITY): Payer: Self-pay

## 2022-11-29 ENCOUNTER — Ambulatory Visit: Payer: 59 | Admitting: Podiatry

## 2022-12-05 ENCOUNTER — Other Ambulatory Visit: Payer: Self-pay | Admitting: Student

## 2022-12-05 DIAGNOSIS — I5032 Chronic diastolic (congestive) heart failure: Secondary | ICD-10-CM

## 2022-12-12 ENCOUNTER — Other Ambulatory Visit: Payer: Self-pay | Admitting: Student

## 2022-12-12 DIAGNOSIS — J302 Other seasonal allergic rhinitis: Secondary | ICD-10-CM

## 2022-12-13 ENCOUNTER — Other Ambulatory Visit: Payer: Self-pay | Admitting: Student

## 2022-12-13 ENCOUNTER — Encounter (HOSPITAL_COMMUNITY): Payer: Self-pay | Admitting: *Deleted

## 2022-12-13 DIAGNOSIS — J4489 Other specified chronic obstructive pulmonary disease: Secondary | ICD-10-CM

## 2022-12-18 ENCOUNTER — Other Ambulatory Visit: Payer: Self-pay | Admitting: Student

## 2022-12-18 DIAGNOSIS — J4489 Other specified chronic obstructive pulmonary disease: Secondary | ICD-10-CM

## 2022-12-25 ENCOUNTER — Other Ambulatory Visit: Payer: Self-pay

## 2022-12-25 ENCOUNTER — Other Ambulatory Visit: Payer: Self-pay | Admitting: Student

## 2022-12-25 ENCOUNTER — Other Ambulatory Visit (HOSPITAL_COMMUNITY): Payer: Self-pay

## 2022-12-25 DIAGNOSIS — J4489 Other specified chronic obstructive pulmonary disease: Secondary | ICD-10-CM

## 2022-12-25 DIAGNOSIS — I5032 Chronic diastolic (congestive) heart failure: Secondary | ICD-10-CM

## 2022-12-25 DIAGNOSIS — J302 Other seasonal allergic rhinitis: Secondary | ICD-10-CM

## 2022-12-25 MED ORDER — SPIRONOLACTONE 25 MG PO TABS
12.5000 mg | ORAL_TABLET | Freq: Every day | ORAL | 0 refills | Status: DC
  Filled 2022-12-25: qty 15, 30d supply, fill #0

## 2022-12-25 MED ORDER — EMPAGLIFLOZIN 10 MG PO TABS
10.0000 mg | ORAL_TABLET | Freq: Every day | ORAL | 0 refills | Status: DC
  Filled 2022-12-25: qty 30, 30d supply, fill #0

## 2022-12-25 MED ORDER — BACLOFEN 10 MG PO TABS
10.0000 mg | ORAL_TABLET | Freq: Three times a day (TID) | ORAL | 0 refills | Status: DC | PRN
  Filled 2022-12-25: qty 90, 30d supply, fill #0

## 2022-12-25 MED ORDER — MONTELUKAST SODIUM 10 MG PO TABS
10.0000 mg | ORAL_TABLET | Freq: Every day | ORAL | 0 refills | Status: DC
  Filled 2022-12-25: qty 30, 30d supply, fill #0

## 2022-12-25 MED ORDER — GABAPENTIN 100 MG PO CAPS
200.0000 mg | ORAL_CAPSULE | Freq: Three times a day (TID) | ORAL | 0 refills | Status: DC
  Filled 2022-12-25 (×2): qty 180, 30d supply, fill #0

## 2022-12-25 MED ORDER — TRELEGY ELLIPTA 100-62.5-25 MCG/ACT IN AEPB
1.0000 | INHALATION_SPRAY | Freq: Every day | RESPIRATORY_TRACT | 3 refills | Status: DC
  Filled 2022-12-25: qty 60, 30d supply, fill #0

## 2022-12-25 MED ORDER — GABAPENTIN 300 MG PO CAPS
300.0000 mg | ORAL_CAPSULE | Freq: Three times a day (TID) | ORAL | 0 refills | Status: DC
  Filled 2022-12-25: qty 90, 30d supply, fill #0

## 2022-12-25 MED ORDER — LOSARTAN POTASSIUM 100 MG PO TABS
100.0000 mg | ORAL_TABLET | Freq: Every day | ORAL | 0 refills | Status: DC
  Filled 2022-12-25 (×2): qty 30, 30d supply, fill #0

## 2022-12-25 MED ORDER — ALBUTEROL SULFATE HFA 108 (90 BASE) MCG/ACT IN AERS
INHALATION_SPRAY | RESPIRATORY_TRACT | 0 refills | Status: DC
  Filled 2022-12-25: qty 13.4, 50d supply, fill #0

## 2022-12-25 MED ORDER — DICLOFENAC SODIUM 1 % EX GEL
4.0000 g | Freq: Four times a day (QID) | CUTANEOUS | 0 refills | Status: DC
  Filled 2022-12-25: qty 100, 13d supply, fill #0

## 2022-12-25 MED ORDER — FLUTICASONE PROPIONATE 50 MCG/ACT NA SUSP
2.0000 | Freq: Two times a day (BID) | NASAL | 0 refills | Status: DC
  Filled 2022-12-25: qty 16, 30d supply, fill #0

## 2022-12-27 ENCOUNTER — Other Ambulatory Visit: Payer: Self-pay

## 2022-12-31 ENCOUNTER — Other Ambulatory Visit: Payer: Self-pay

## 2022-12-31 NOTE — Telephone Encounter (Signed)
Patient calls nurse line requesting refills on Voltaren Gel and Buprenorphine.   She reports she received Buprenorphine from the hospital. She reports she has a new patient apt with pain management scheduled for 12/18.   Advised unsure if PCP will refill without an apt, however she reports she can not leave her home due to the pain. She reports she is "debilitated" in bed.   Will forward to PCP.

## 2023-01-01 ENCOUNTER — Other Ambulatory Visit: Payer: Self-pay | Admitting: Student

## 2023-01-03 ENCOUNTER — Other Ambulatory Visit: Payer: Self-pay | Admitting: Podiatry

## 2023-01-10 ENCOUNTER — Telehealth: Payer: Self-pay | Admitting: Student

## 2023-01-10 MED ORDER — DICLOFENAC SODIUM 1 % EX GEL: 4.0000 g | Freq: Four times a day (QID) | CUTANEOUS | 0 refills | Status: DC

## 2023-01-10 NOTE — Telephone Encounter (Signed)
Called patient after I received request to refill Buprenorphine. Wanted to understand who started her on the med, why she is on the medication and if she was advised to continue the medication after discharge from the hospital. Unfortunately unable to reach patient but left a HIPAA compliant generic VM.    Addendum: Patient called back and said she does not need a refill on the buprenorphine because she has been out of it for so long that it has not been helping her pain either.  However she would like to get the Voltaren gel refilled.  She also has an upcoming appointment with pain management clinic next Wednesday 12/18 and after that she plans to get an appointment to see me soon.

## 2023-01-10 NOTE — Telephone Encounter (Signed)
Patient calls nurse line stating that she missed a call from Dr. Elliot Gurney.   Advised that I would forward message to PCP.   Patient requesting returned call at [phone].  Veronda Prude, RN

## 2023-01-13 ENCOUNTER — Other Ambulatory Visit: Payer: Self-pay | Admitting: Student

## 2023-01-13 DIAGNOSIS — J302 Other seasonal allergic rhinitis: Secondary | ICD-10-CM

## 2023-01-14 ENCOUNTER — Other Ambulatory Visit: Payer: Self-pay | Admitting: Student

## 2023-01-15 ENCOUNTER — Encounter: Payer: 59 | Attending: Physical Medicine & Rehabilitation | Admitting: Physical Medicine & Rehabilitation

## 2023-01-15 ENCOUNTER — Encounter: Payer: Self-pay | Admitting: Physical Medicine & Rehabilitation

## 2023-01-15 VITALS — BP 142/79 | HR 81 | Ht 69.0 in

## 2023-01-15 DIAGNOSIS — M4804 Spinal stenosis, thoracic region: Secondary | ICD-10-CM | POA: Diagnosis not present

## 2023-01-15 DIAGNOSIS — G894 Chronic pain syndrome: Secondary | ICD-10-CM | POA: Diagnosis not present

## 2023-01-15 DIAGNOSIS — M5416 Radiculopathy, lumbar region: Secondary | ICD-10-CM | POA: Insufficient documentation

## 2023-01-15 DIAGNOSIS — E1142 Type 2 diabetes mellitus with diabetic polyneuropathy: Secondary | ICD-10-CM | POA: Diagnosis not present

## 2023-01-15 DIAGNOSIS — M7541 Impingement syndrome of right shoulder: Secondary | ICD-10-CM | POA: Diagnosis not present

## 2023-01-15 DIAGNOSIS — Z794 Long term (current) use of insulin: Secondary | ICD-10-CM | POA: Diagnosis not present

## 2023-01-15 DIAGNOSIS — Z5181 Encounter for therapeutic drug level monitoring: Secondary | ICD-10-CM | POA: Diagnosis not present

## 2023-01-15 DIAGNOSIS — M7542 Impingement syndrome of left shoulder: Secondary | ICD-10-CM | POA: Insufficient documentation

## 2023-01-15 DIAGNOSIS — M67921 Unspecified disorder of synovium and tendon, right upper arm: Secondary | ICD-10-CM | POA: Insufficient documentation

## 2023-01-15 DIAGNOSIS — M5414 Radiculopathy, thoracic region: Secondary | ICD-10-CM | POA: Diagnosis not present

## 2023-01-15 DIAGNOSIS — Z79891 Long term (current) use of opiate analgesic: Secondary | ICD-10-CM | POA: Insufficient documentation

## 2023-01-15 MED ORDER — GABAPENTIN 300 MG PO CAPS: 300.0000 mg | ORAL_CAPSULE | Freq: Three times a day (TID) | ORAL | 5 refills | Status: DC
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
## 2023-01-15 NOTE — Patient Instructions (Signed)
ALWAYS FEEL FREE TO CALL OUR OFFICE WITH ANY PROBLEMS OR QUESTIONS [phone])  **PLEASE NOTE** ALL MEDICATION REFILL REQUESTS (INCLUDING CONTROLLED SUBSTANCES) NEED TO BE MADE AT LEAST 7 DAYS PRIOR TO REFILL BEING DUE. ANY REFILL REQUESTS INSIDE THAT TIME FRAME MAY RESULT IN DELAYS IN RECEIVING YOUR PRESCRIPTION.    !!!!!!!HAPPY HOLIDAYS!!!!!!

## 2023-01-18 LAB — TOXASSURE SELECT,+ANTIDEPR,UR

## 2023-01-24 ENCOUNTER — Telehealth: Payer: Self-pay | Admitting: Registered Nurse

## 2023-01-24 ENCOUNTER — Telehealth: Payer: Self-pay

## 2023-01-24 NOTE — Telephone Encounter (Signed)
UDS results was Reviewed.  + Cocaine,  Ms. Chandra was called regarding the above, she denies Cocaine use.  She was instructed to call [facility] and we would not be able to prescribe for her, she verbalizes understanding.  Will forward message to Dr Riley Kill.

## 2023-01-24 NOTE — Telephone Encounter (Signed)
Patient called in asking about UDS results and pain medication, she wants to know if it will be sent in.

## 2023-01-24 NOTE — Telephone Encounter (Signed)
Discharge letter mailed through [employer] and sent through [facility] as well.

## 2023-01-30 ENCOUNTER — Other Ambulatory Visit: Payer: Self-pay | Admitting: Student

## 2023-01-30 DIAGNOSIS — J4489 Other specified chronic obstructive pulmonary disease: Secondary | ICD-10-CM

## 2023-02-01 ENCOUNTER — Other Ambulatory Visit: Payer: Self-pay | Admitting: Student

## 2023-02-10 ENCOUNTER — Other Ambulatory Visit: Payer: Self-pay

## 2023-02-10 MED ORDER — DICLOFENAC SODIUM 1 % EX GEL: 4.0000 g | Freq: Four times a day (QID) | CUTANEOUS | 0 refills | Status: DC

## 2023-02-11 ENCOUNTER — Other Ambulatory Visit: Payer: Self-pay | Admitting: Family Medicine

## 2023-02-11 ENCOUNTER — Encounter: Payer: Self-pay | Admitting: Student

## 2023-02-11 DIAGNOSIS — J4489 Other specified chronic obstructive pulmonary disease: Secondary | ICD-10-CM

## 2023-02-12 ENCOUNTER — Ambulatory Visit: Payer: 59 | Admitting: Registered Nurse

## 2023-02-12 MED ORDER — CARVEDILOL 12.5 MG PO TABS: 12.5000 mg | ORAL_TABLET | Freq: Two times a day (BID) | ORAL | 0 refills | Status: DC

## 2023-02-17 ENCOUNTER — Encounter: Payer: Self-pay | Admitting: Student

## 2023-02-19 ENCOUNTER — Other Ambulatory Visit: Payer: Self-pay | Admitting: Student

## 2023-02-19 ENCOUNTER — Other Ambulatory Visit: Payer: Self-pay | Admitting: Family Medicine

## 2023-02-19 DIAGNOSIS — J4489 Other specified chronic obstructive pulmonary disease: Secondary | ICD-10-CM

## 2023-02-24 ENCOUNTER — Encounter: Payer: Self-pay | Admitting: Student

## 2023-02-25 ENCOUNTER — Other Ambulatory Visit: Payer: Self-pay | Admitting: Student

## 2023-02-26 ENCOUNTER — Other Ambulatory Visit: Payer: Self-pay | Admitting: Student

## 2023-02-26 DIAGNOSIS — I5032 Chronic diastolic (congestive) heart failure: Secondary | ICD-10-CM

## 2023-02-26 MED ORDER — SPIRONOLACTONE 25 MG PO TABS: 12.5000 mg | ORAL_TABLET | Freq: Every day | ORAL | 0 refills | Status: DC

## 2023-02-26 MED ORDER — MONTELUKAST SODIUM 10 MG PO TABS: 10.0000 mg | ORAL_TABLET | Freq: Every day | ORAL | 0 refills | Status: DC

## 2023-02-26 MED ORDER — ATORVASTATIN CALCIUM 40 MG PO TABS: 40.0000 mg | ORAL_TABLET | Freq: Every day | ORAL | 0 refills | Status: DC

## 2023-02-26 NOTE — Progress Notes (Signed)
Refilled meds

## 2023-02-28 ENCOUNTER — Other Ambulatory Visit: Payer: Self-pay | Admitting: Student

## 2023-03-05 ENCOUNTER — Other Ambulatory Visit: Payer: Self-pay | Admitting: Student

## 2023-03-05 ENCOUNTER — Other Ambulatory Visit (HOSPITAL_BASED_OUTPATIENT_CLINIC_OR_DEPARTMENT_OTHER): Payer: Self-pay

## 2023-03-05 ENCOUNTER — Other Ambulatory Visit: Payer: Self-pay

## 2023-03-06 ENCOUNTER — Other Ambulatory Visit: Payer: Self-pay | Admitting: Student

## 2023-03-06 ENCOUNTER — Other Ambulatory Visit (HOSPITAL_BASED_OUTPATIENT_CLINIC_OR_DEPARTMENT_OTHER): Payer: Self-pay

## 2023-03-06 ENCOUNTER — Other Ambulatory Visit (HOSPITAL_COMMUNITY): Payer: Self-pay

## 2023-03-07 ENCOUNTER — Other Ambulatory Visit (HOSPITAL_COMMUNITY): Payer: Self-pay

## 2023-03-07 MED ORDER — LOSARTAN POTASSIUM 100 MG PO TABS
100.0000 mg | ORAL_TABLET | Freq: Every day | ORAL | 0 refills | Status: DC
  Filled 2023-03-07: qty 30, 30d supply, fill #0

## 2023-03-12 ENCOUNTER — Other Ambulatory Visit: Payer: Self-pay | Admitting: Student

## 2023-03-12 ENCOUNTER — Encounter: Payer: Self-pay | Admitting: Student

## 2023-03-13 MED ORDER — BUSPIRONE HCL 10 MG PO TABS: 10.0000 mg | ORAL_TABLET | Freq: Three times a day (TID) | ORAL | 0 refills | Status: DC

## 2023-03-15 ENCOUNTER — Other Ambulatory Visit: Payer: Self-pay | Admitting: Student

## 2023-03-17 ENCOUNTER — Other Ambulatory Visit (HOSPITAL_COMMUNITY): Payer: Self-pay

## 2023-03-18 ENCOUNTER — Encounter: Payer: Self-pay | Admitting: Student

## 2023-03-18 DIAGNOSIS — Z1231 Encounter for screening mammogram for malignant neoplasm of breast: Secondary | ICD-10-CM

## 2023-03-21 ENCOUNTER — Other Ambulatory Visit: Payer: Self-pay | Admitting: Student

## 2023-03-21 ENCOUNTER — Other Ambulatory Visit: Payer: Self-pay | Admitting: Family Medicine

## 2023-03-21 DIAGNOSIS — Z0000 Encounter for general adult medical examination without abnormal findings: Secondary | ICD-10-CM

## 2023-03-24 ENCOUNTER — Ambulatory Visit: Payer: 59 | Admitting: Student

## 2023-03-27 ENCOUNTER — Other Ambulatory Visit: Payer: Self-pay

## 2023-03-27 ENCOUNTER — Ambulatory Visit: Payer: 59 | Admitting: Podiatry

## 2023-03-27 MED ORDER — DICLOFENAC SODIUM 1 % EX GEL
4.0000 g | Freq: Four times a day (QID) | CUTANEOUS | 0 refills | Status: DC | PRN
Start: 2023-03-27 — End: 2023-04-10

## 2023-03-27 MED ORDER — MONTELUKAST SODIUM 10 MG PO TABS: 10.0000 mg | ORAL_TABLET | Freq: Every day | ORAL | 0 refills | Status: DC

## 2023-03-31 ENCOUNTER — Ambulatory Visit: Payer: 59 | Admitting: Student

## 2023-04-03 ENCOUNTER — Ambulatory Visit: Payer: 59

## 2023-04-07 ENCOUNTER — Encounter: Payer: Self-pay | Admitting: Student

## 2023-04-10 MED ORDER — DICLOFENAC SODIUM 1 % EX GEL: 4.0000 g | Freq: Four times a day (QID) | CUTANEOUS | 11 refills | Status: DC | PRN

## 2023-04-14 ENCOUNTER — Telehealth (INDEPENDENT_AMBULATORY_CARE_PROVIDER_SITE_OTHER): Admitting: Student

## 2023-04-14 DIAGNOSIS — M5414 Radiculopathy, thoracic region: Secondary | ICD-10-CM | POA: Diagnosis not present

## 2023-04-14 DIAGNOSIS — F418 Other specified anxiety disorders: Secondary | ICD-10-CM

## 2023-04-14 DIAGNOSIS — M4804 Spinal stenosis, thoracic region: Secondary | ICD-10-CM | POA: Diagnosis not present

## 2023-04-14 DIAGNOSIS — F339 Major depressive disorder, recurrent, unspecified: Secondary | ICD-10-CM

## 2023-04-14 DIAGNOSIS — M48062 Spinal stenosis, lumbar region with neurogenic claudication: Secondary | ICD-10-CM

## 2023-04-14 NOTE — Patient Instructions (Signed)
Therapy and Counseling Resources Most providers on this list will take Medicaid. Patients with commercial insurance or Medicare should contact their insurance company to get a list of in network providers.  [facility] (takes children) Location 1: [address] [place], [place] [address] Location 2: [address] [place], [place] [address] [phone]   [facility] (spanish speaking therapist available)(habla espanol)(take medicare and medicaid)  [address], [place], [place] [address], [place] [email or web address] [phone]  [facility] [address] [place], [place] [address] [phone]  [facility]   [address]   [place], [place] [address]      [phone]  [facility] (spanish available) [address]  [place], [place] [address] [phone]  [facility] (take medicaid and medicare) [address]  [place], [place] [address]       [phone]     [facility] (virtual only) [phone]  [facility] [address], [place], [place] [phone]  [facility]:  [address] [place] [phone]  [facility]:  [address], [place] [phone]  [facility] (under & uninsured) [address]   [place] [phone]    [email or web address]    [facility] [address]  [place]    [phone]  [facility] [place] -[address]     Phone:  [phone]     [place]-  [address]  [phone]   [facility] [address]      [place], [place] [phone]  [facility]: [address], [place], [place]  [phone]   [facility] (Spanish therapist) [email or web address]  [address]    [place] [address]    [phone]    [facility]   [address],  [place], [place]  [phone]   [facility]  [address] [place]  [phone]  [facility]  [address] [place], [place]        [phone]  Open Access/Walk In Clinic under & uninsured  [facility]  [address] [place], [place] [phone] Crisis [phone]  [facility] [place],  (Spanish)   [address], [place]: ([phone]) 8:30 - 12; 1 - 2:30  [facility],  [address], [place]    ([phone]):8:30 - 12; 2 - 3PM  [facility] [place],  [address],  [place]; ([phone]):   Mon - Fri 8 AM - 5 PM  [facility] [address] [place]  MWF 12:30 to 3:00 or call to schedule an appointment  [phone]  Specific Provider options Psychology Today  [email or web address] click on find a therapist  enter your zip code left side and select or tailor a therapist for your specific need.   [facility] Provider Directory [email or web address]  (Medicaid)   Follow all drop down to find a provider  Social Support program [facility] [place] [phone] or www.[email or web address] [address], [place], [place] Recovery support and educational   24- Hour Availability:   [facility]  [address] [place], [place] [phone] Crisis [phone]  [facility]  Crisis Line [phone]  [facility]   Crisis Service  [phone]   [facility] [place] [facility]  [phone] (after hours)  Therapeutic Alternative/Mobile Crisis   1-877-626-1772  [place] National Suicide Hotline  1-800-273-8255 (TALK)  Call 911 or go to emergency room  [facility]  (800-256-2452);  [place]   [facility]   ([phone]); [place], [place], [place], [place], [place], [place], [place]  

## 2023-04-14 NOTE — Progress Notes (Unsigned)
 Virtual Visit via Video Note  I connected with [name] on 04/14/23 at  1:50 PM EDT by a video enabled telemedicine application and verified that I am speaking with the correct person using two identifiers.  Location: Patient:  Home Provider: [facility]   I discussed the limitations of evaluation and management by telemedicine and the availability of in person appointments. The patient expressed understanding and agreed to proceed.  History of Present Illness:  57 year old female with Hx of Depression. No officially diagnosis of Bipolar but have been told about suspicion for bipolar in the past. Tried multiple medications in the past and nothing has helped. Currently meds  (Buspar) started over 6 weeks ago not helping. Reports low tolerance with people and easy irritability. Sleeps all the time and lost interest in activities or spending time with faamily. Expresses stress with her currently medical state including chronic back pain limiting ambulation and recent hospitalization for suspected opoid withdrawal side effect. Not seeing ortho any more because she hasn't been able to leave the house after fall on mothers day. Now learning how to walk on her own and currently does not have PT because she cannot make appointments outside of her home.  Currently on Buspar 10mg  TID Previously:  Effexor, Abilify, Zoloft, Trazodone   Observations/Objective: Most recent weight 210.6lbs (Reported by patient) Laying down in bed. NAD Morbidly obese.     Assessment and Plan:  Depression with anxiety Elevated PHQ-9 score of 15.  No active SI/HI.  However patient has extensive history of depression with poor response to medication.  Given this has been refractory, she would benefit from seeing a psychiatrist for more hands on management for her depression.  No signs of maniac episodes per interview with patient.  Strongly believe patient will benefit from combination of CBT and medication. -Continue  BuSpar 10 mg 3 times daily. -Provided patient with list of counselors covered by her medications -Referral to psychiatry.  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  Having difficulty with ambulation and given recent fall due to back pain, patient will most definitely benefit from physical therapy.  However she is having difficulty with ambulation making it difficult to make her appointments.  In the physical therapy has been very helpful for patient in the past jungly believe she will benefit from home health physical therapy. -Placed referral for home health PT. -Counseled on fall precautions -Continue pain management with pain clinic    Follow Up Instructions:    I discussed the assessment and treatment plan with the patient. The patient was provided an opportunity to ask questions and all were answered. The patient agreed with the plan and demonstrated an understanding of the instructions.   The patient was advised to call back or seek an in-person evaluation if the symptoms worsen or if the condition fails to improve as anticipated.  I provided 15  minutes of non-face-to-face time during this encounter.   Jerre Simon, MD

## 2023-04-15 NOTE — Assessment & Plan Note (Signed)
 Currently bed reading.  Having difficulty with ambulation and given recent fall due to back pain, patient will most definitely benefit from physical therapy.  However she is having difficulty with ambulation making it difficult to make her appointments.  In the physical therapy has been very helpful for patient in the past jungly believe she will benefit from home health physical therapy. -Placed referral for home health PT. -Counseled on fall precautions -Continue pain management with pain clinic

## 2023-04-15 NOTE — Assessment & Plan Note (Addendum)
 Elevated PHQ-9 score of 15.  No active SI/HI.  However patient has extensive history of depression with poor response to medication.  Given this has been refractory, she would benefit from seeing a psychiatrist for more hands on management for her depression.  No signs of maniac episodes per interview with patient.  Strongly believe patient will benefit from combination of CBT and medication. -Continue BuSpar 10 mg 3 times daily. -Provided patient with list of counselors covered by her medications -Referral to psychiatry.

## 2023-04-17 ENCOUNTER — Telehealth: Payer: Self-pay | Admitting: *Deleted

## 2023-04-17 ENCOUNTER — Encounter: Payer: Self-pay | Admitting: Licensed Clinical Social Worker

## 2023-04-17 NOTE — Patient Outreach (Signed)
 Care Coordination   Collaboration  Visit Note   04/17/2023 Name: ALIANNY TOELLE MRN: [id number] DOB: March 05, 1965  [name] is a 58 y.o. year old female who sees Jerre Simon, MD for primary care. I  collaborated with Care Guide  What matters to the patients health and wellness today?  Patient was not engaged during this encounter   SDOH assessments and interventions completed:  No     Care Coordination Interventions:  Yes, provided  Interventions Today    Flowsheet Row Most Recent Value  General Interventions   General Interventions Discussed/Reviewed Communication with  Communication with --  [LCSW and Care Guide collaborated about referral to assist wiht MH management]       Follow up plan:  Patient will be scheduled with an LCSW    Encounter Outcome:  Patient Visit Completed   Jenel Lucks, LCSW [place]  [facility], [facility] Clinical Social Worker Direct Dial: [phone]  Fax: [phone] Website: [email or web address] 10:08 AM

## 2023-04-17 NOTE — Progress Notes (Signed)
 Complex Care Management Note Care Guide Note  04/17/2023 Name: [name] MRN: [id number] DOB: Aug 20, 1965   Complex Care Management Outreach Attempts: An unsuccessful telephone outreach was attempted today to offer the patient information about available complex care management services.  Follow Up Plan:  Additional outreach attempts will be made to offer the patient complex care management information and services.   Encounter Outcome:  No Answer  Gwenevere Ghazi  [facility]  [facility], [facility] Guide  Direct Dial: [phone]  Fax [phone]

## 2023-04-17 NOTE — Progress Notes (Signed)
 Complex Care Management Note  Care Guide Note 04/17/2023 Name: [name] MRN: [id number] DOB: [birth year]  [name] is a 58 y.o. year old female who sees Jerre Simon, MD for primary care. I reached out to [name] by phone today to offer complex care management services.  [name] was given information about Complex Care Management services today including:   The Complex Care Management services include support from the care team which includes your Nurse Care Manager, Clinical Social Worker, or Pharmacist.  The Complex Care Management team is here to help remove barriers to the health concerns and goals most important to you. Complex Care Management services are voluntary, and the patient may decline or stop services at any time by request to their care team member.   Complex Care Management Consent Status: Patient agreed to services and verbal consent obtained.   Follow up plan:  Telephone appointment with complex care management team member scheduled for:  3/24  Encounter Outcome:  Patient Scheduled Gwenevere Ghazi  [facility] Health  [facility], [facility] Guide  Direct Dial: [phone]  Fax [phone]

## 2023-04-18 ENCOUNTER — Ambulatory Visit: Payer: 59 | Admitting: Acute Care

## 2023-04-21 ENCOUNTER — Encounter: Admitting: Licensed Clinical Social Worker

## 2023-04-23 ENCOUNTER — Telehealth: Payer: Self-pay | Admitting: *Deleted

## 2023-04-23 ENCOUNTER — Other Ambulatory Visit: Payer: Self-pay | Admitting: Student

## 2023-04-23 DIAGNOSIS — J302 Other seasonal allergic rhinitis: Secondary | ICD-10-CM

## 2023-04-23 NOTE — Progress Notes (Signed)
 Complex Care Management Care Guide Note  04/23/2023 Name: ZANETA LIGHTCAP MRN: [id number] DOB: 1965/05/14  [name] is a 58 y.o. year old female who is a primary care patient of Jerre Simon, MD and is actively engaged with the care management team. I reached out to [name] by phone today to assist with re-scheduling  with the Licensed Clinical Child psychotherapist.  Follow up plan: Unsuccessful telephone outreach attempt made. A HIPAA compliant phone message was left for the patient providing contact information and requesting a return call.  Gwenevere Ghazi  [facility]  [facility], [facility] Guide  Direct Dial: [phone]  Fax [phone]

## 2023-04-28 ENCOUNTER — Ambulatory Visit: Payer: 59

## 2023-04-28 ENCOUNTER — Other Ambulatory Visit: Payer: Self-pay | Admitting: Student

## 2023-04-28 VITALS — Wt 217.6 lb

## 2023-04-28 DIAGNOSIS — I5032 Chronic diastolic (congestive) heart failure: Secondary | ICD-10-CM

## 2023-04-28 DIAGNOSIS — Z0000 Encounter for general adult medical examination without abnormal findings: Secondary | ICD-10-CM | POA: Diagnosis not present

## 2023-04-28 NOTE — Patient Instructions (Addendum)
 [name] , Thank you for taking time to come for your Medicare Wellness Visit. I appreciate your ongoing commitment to your health goals. Please review the following plan we discussed and let me know if I can assist you in the future.   Referrals/Orders/Follow-Ups/Clinician Recommendations: Keep maintaining your health by keeping your appointments with Dr. Elliot Gurney and any specialists that you may see.  Call us if you need anything.  Have a great year!!!!  This is a list of the screening recommended for you and due dates:  Health Maintenance  Topic Date Due   Yearly kidney health urinalysis for diabetes  Never done   Zoster (Shingles) Vaccine (2 of 2) 06/18/2021   Complete foot exam   06/09/2022   Flu Shot  08/29/2022   COVID-19 Vaccine (6 - 2024-25 season) 09/29/2022   Screening for Lung Cancer  10/16/2022   Mammogram  01/31/2023   Hemoglobin A1C  03/12/2023   Eye exam for diabetics  04/23/2023   Yearly kidney function blood test for diabetes  09/13/2023   Medicare Annual Wellness Visit  04/27/2024   Pap with HPV screening  12/05/2026   Colon Cancer Screening  03/08/2029   DTaP/Tdap/Td vaccine (3 - Td or Tdap) 06/29/2030   Pneumococcal Vaccination  Completed   Hepatitis C Screening  Completed   HIV Screening  Completed   HPV Vaccine  Aged Out    Advanced directives: (Declined) Advance directive discussed with you today. Even though you declined this today, please call our office should you change your mind, and we can give you the proper paperwork for you to fill out.  Next Medicare Annual Wellness Visit scheduled for next year: Yes

## 2023-04-28 NOTE — Progress Notes (Signed)
 Because this visit was a virtual/telehealth visit,  certain criteria was not obtained, such a blood pressure, CBG if applicable, and timed get up and go. Any medications not marked as "taking" were not mentioned during the medication reconciliation part of the visit. Any vitals not documented were not able to be obtained due to this being a telehealth visit or patient was unable to self-report a recent blood pressure reading due to a lack of equipment at home via telehealth. Vitals that have been documented are verbally provided by the patient.   Subjective:   [name] is a 58 y.o. who presents for a Medicare Wellness preventive visit.  Visit Complete: Virtual I connected with  [name] on 04/28/23 by a audio enabled telemedicine application and verified that I am speaking with the correct person using two identifiers.  Patient Location: Home  Provider Location: Office/Clinic  I discussed the limitations of evaluation and management by telemedicine. The patient expressed understanding and agreed to proceed.  Vital Signs: Because this visit was a virtual/telehealth visit, some criteria may be missing or patient reported. Any vitals not documented were not able to be obtained and vitals that have been documented are patient reported.  VideoDeclined- This patient declined Librarian, academic. Therefore the visit was completed with audio only.  Persons Participating in Visit: Patient.  AWV Questionnaire: Yes: Patient Medicare AWV questionnaire was completed by the patient on 04/23/2023; I have confirmed that all information answered by patient is correct and no changes since this date.  Cardiac Risk Factors include: advanced age (>3men, >24 women);diabetes mellitus;dyslipidemia;family history of premature cardiovascular disease;hypertension;obesity (BMI >30kg/m2);sedentary lifestyle;smoking/ tobacco exposure     Objective:    Today's Vitals    04/28/23 0834 04/28/23 0836  Weight: 217 lb 9.6 oz (98.7 kg)   PainSc: 10-Worst pain ever 10-Worst pain ever  PainLoc: Generalized    Body mass index is 32.13 kg/m.     04/28/2023    8:37 AM 08/28/2022    9:40 AM 07/11/2022    2:00 AM 06/14/2022    2:33 PM 06/09/2022    7:00 PM 05/15/2022    8:50 AM 04/02/2022   10:17 AM  Advanced Directives  Does Patient Have a Medical Advance Directive? No No No No No Yes No  Type of Careers adviser;Living will   Would patient like information on creating a medical advance directive? No - Patient declined No - Patient declined No - Patient declined Yes (ED - Information included in AVS) No - Patient declined  No - Patient declined    Current Medications (verified) Outpatient Encounter Medications as of 04/28/2023  Medication Sig   acetaminophen (TYLENOL) 500 MG tablet Take 1,000 mg by mouth 2 (two) times daily.   albuterol (VENTOLIN HFA) 108 (90 Base) MCG/ACT inhaler INHALE TWO puffs into THE lungs EVERY SIX HOURS AS NEEDED wheezing OR For SHORTNESS OF BREATH   ammonium lactate (LAC-HYDRIN) 12 % lotion For dry skin (Patient taking differently: Apply 1 Application topically 2 (two) times daily. For dry skin)   atorvastatin (LIPITOR) 40 MG tablet Take 1 tablet (40 mg total) by mouth daily.   baclofen (LIORESAL) 10 MG tablet TAKE ONE TABLET BY MOUTH THREE TIMES DAILY AS NEEDED muscle SPASMS   Buprenorphine HCl (BELBUCA) 900 MCG FILM Place 900 mcg inside cheek 2 (two) times daily.   busPIRone (BUSPAR) 10 MG tablet Take 1 tablet (10 mg total) by mouth  3 (three) times daily.   carvedilol (COREG) 12.5 MG tablet Take 1 tablet (12.5 mg total) by mouth 2 (two) times daily with a meal.   cloNIDine (CATAPRES) 0.2 MG tablet Take 1 tablet (0.2 mg total) by mouth 2 (two) times daily.   diclofenac Sodium (VOLTAREN) 1 % GEL Apply 4 g topically 4 (four) times daily as needed.   DILT-XR 120 MG 24 hr capsule TAKE ONE CAPSULE BY MOUTH  TWICE DAILY related TO essential primary hypertension   empagliflozin (JARDIANCE) 10 MG TABS tablet TAKE ONE TABLET BY MOUTH EVERY DAY related TO type2 diabetes   fluticasone (FLONASE) 50 MCG/ACT nasal spray instill TWO SPRAYS IN EACH NOSTRIL TWICE DAILY   furosemide (LASIX) 80 MG tablet TAKE ONE TABLET BY MOUTH TWICE DAILY RELATED TO SYSTOLIC (CONGESTIVE HEART FAILURE)   gabapentin (NEURONTIN) 300 MG capsule Take 1 capsule (300 mg total) by mouth 3 (three) times daily.   lidocaine (LIDODERM) 5 % Place 1 patch onto the skin daily. Remove & Discard patch within 12 hours or as directed by MD   loratadine (CLARITIN) 10 MG tablet TAKE ONE TABLET BY MOUTH EVERY DAY FOR allergies   losartan (COZAAR) 100 MG tablet Take 1 tablet (100 mg total) by mouth daily.   losartan (COZAAR) 25 MG tablet TAKE ONE TABLET BY MOUTH EVERY DAY   methocarbamol (ROBAXIN) 500 MG tablet Take 500 mg by mouth every 6 (six) hours as needed for muscle spasms.   montelukast (SINGULAIR) 10 MG tablet Take 1 tablet (10 mg total) by mouth at bedtime.   Multiple Vitamins-Minerals (MULTIVITAMIN ADULTS 50+) TABS Take 1 tablet by mouth daily.   NARCAN 4 MG/0.1ML LIQD nasal spray kit Place 0.4 mg into the nose once as needed (accidental overdose).   omeprazole (PRILOSEC) 20 MG capsule Take 1 capsule (20 mg total) by mouth daily.   potassium chloride SA (KLOR-CON M) 20 MEQ tablet TAKE ONE TABLET BY MOUTH EVERY DAY related TO acute chronic systolic (congestive) heart failure   spironolactone (ALDACTONE) 25 MG tablet Take 0.5 tablets (12.5 mg total) by mouth daily.   TRELEGY ELLIPTA 100-62.5-25 MCG/ACT AEPB INHALE ONE PUFF INTO THE LUNGS EVERY DAY RELATED TO ACUTE AND CHRONIC RESPIRATORY FAILURE.   No facility-administered encounter medications on file as of 04/28/2023.    Allergies (verified) Asa [aspirin], Methadose [methadone hcl], and Levaquin [levofloxacin]   History: Past Medical History:  Diagnosis Date   Alcoholism in remission  (HCC)    11-26-2021  pt stated no alcohol few yrs ago   Asthma    Bipolar disorder (HCC)    Chronic diastolic CHF (congestive heart failure) (HCC) 2014   followed by dr b. Cristal Deer;   Chronic hypoxemic respiratory failure [facility])    pulmonologist--- dr Tonia Brooms;   (11-26-2021 per pt was on supplemental oxygen until she 01/ 2022 did not need it,  does not check O2 sats at home anymore.   CKD (chronic kidney disease), stage III [facility])    nephrologist--- dr Ronalee Belts   COPD (chronic obstructive pulmonary disease) (HCC)    11-26-2021 pt trying to quit smoking ,  started age 2   Depression    Diabetes mellitus type 2, diet-controlled (HCC)    followed by pcp   (11-26-2021  per pt last A1c by pcp on 10-30-2021 7.8,  stated does not check blood sugar)   Dyspnea    Fibrocystic breast disease    GAD (generalized anxiety disorder)    GERD (gastroesophageal reflux disease)    Hepatitis  B surface antigen positive 11/2017   pt stated asymptomatic ,  no treatment   History of cervical dysplasia 04/10/2011   '93- once dx.-got pregnant-no intervention, then postpartum, no dysplasia found   History of condyloma acuminatum 04/09/2011   Hyperlipidemia    Hypertension    Incomplete left bundle branch block (LBBB)    Lung nodule 11/2017   pulmonologist--- dr Tonia Brooms;  RUL and RLL   Muscle spasm of both lower legs    chronic , due to back issues,   OA (osteoarthritis) 04/10/2011   hips, shoulders, back   OSA (obstructive sleep apnea) 2009   study on epic 09-18-2007 mild osa w/ hypoxmia ;   (11-26-2021 per pt last used cpap 01/ 2022 before bariatric surgery 04/ 2022 stated lost wt / and stopped needing supplemental oxygen , did not need anymore)   Polyneuropathy    due to back issues and DM   Polysubstance abuse (HCC)    11-26-2021  pt hx cocaine use stated none for yrs but ED visit 08-14-2021  positive UDS,  pt stated it was once only for depression none since   PSVT (paroxysmal supraventricular  tachycardia) (HCC)    followed by cardiology   Spinal stenosis, unspecified spinal region    Urge incontinence of urine    Wears dentures    upper   Past Surgical History:  Procedure Laterality Date   ANAL FISTULECTOMY  04/17/2011   Procedure: FISTULECTOMY ANAL;  Surgeon: Wilmon Arms. Corliss Skains, MD;  Location: [facility];  Service: General;  Laterality: N/A;  Excision of Condyloma Gluteal Cleft    ANAL RECTAL MANOMETRY N/A 03/26/2019   Procedure: ANO RECTAL MANOMETRY;  Surgeon: Tressia Danas, MD;  Location: [facility];  Service: Gastroenterology;  Laterality: N/A;   BIOPSY  03/09/2019   Procedure: BIOPSY;  Surgeon: Tressia Danas, MD;  Location: [facility];  Service: Gastroenterology;;   BOTOX INJECTION N/A 12/29/2018   Procedure: BOTOX INJECTION(100 UNITS)  WITH CYSTOSCOPY;  Surgeon: Rene Paci, MD;  Location: [facility];  Service: Urology;  Laterality: N/A;   BOTOX INJECTION N/A 07/27/2019   Procedure: BOTOX INJECTION WITH CYSTOSCOPY;  Surgeon: Rene Paci, MD;  Location: [facility];  Service: Urology;  Laterality: N/A;  ONLY NEEDS 30 MIN   BOTOX INJECTION N/A 05/02/2021   Procedure: BOTOX INJECTION WITH CYSTOSCOPY;  Surgeon: Rene Paci, MD;  Location: [facility];  Service: Urology;  Laterality: N/A;  ONLY NEEDS 30 MIN   BOTOX INJECTION N/A 11/28/2021   Procedure: BOTOX INJECTION WITH CYSTOSCOPY, 200 UNITS;  Surgeon: Rene Paci, MD;  [facility];  Service: Urology;  Laterality: N/A;  ONLY NEEDS 30 MIN   BOTOX INJECTION N/A 05/15/2022   Procedure: BOTOX INJECTION 200 UNITS;  Surgeon: Rene Paci, MD;  Location: [facility];  Service: Urology;  Laterality: N/A;   BREAST EXCISIONAL BIOPSY Right    x 3   BREAST EXCISIONAL BIOPSY Left    12-29-2000  and 11-30-2001 @[facility]  by dr Carolynne Edouard   CARPAL TUNNEL RELEASE Bilateral    right 08-30-2002 and left 07-04-2003  by dr Otelia Sergeant @ [facility]    CATARACT EXTRACTION W/ INTRAOCULAR LENS IMPLANT Bilateral 12/2020   CERVICAL FUSION  04/10/2011   x3- cervical fusion with plating and screws-Dr. Noel Gerold   COLONOSCOPY WITH PROPOFOL N/A 07/06/2015   Procedure: COLONOSCOPY WITH PROPOFOL;  Surgeon: Dorena Cookey, MD;  [facility];  Service: Endoscopy;  Laterality: N/A;   COLONOSCOPY WITH PROPOFOL  N/A 03/09/2019   Procedure: COLONOSCOPY WITH PROPOFOL;  Surgeon: Tressia Danas, MD;  [facility];  Service: Gastroenterology;  Laterality: N/A;   CYSTOSCOPY N/A 05/15/2022   Procedure: CYSTOSCOPY;  Surgeon: Rene Paci, MD;  Location[facility];  Service: Urology;  Laterality: N/A;   FRACTURE SURGERY     little toe left foot   I & D EXTREMITY Left 12/18/2016   Procedure: IRRIGATION AND DEBRIDEMENT LEFT FOOT, CLOSURE;  Surgeon: Tarry Kos, MD;  Location: [facility];  Service: Orthopedics;  Laterality: Left;   INCISION AND DRAINAGE ABSCESS Right 05/03/2002   @[facility]  by dr Carolynne Edouard;   right breast abscess   INJECTION KNEE Right 10/03/2020   Procedure: KNEE INJECTION;  Surgeon: Kerrin Champagne, MD;  [facility];  Service: Orthopedics;  Laterality: Right;   KNEE ARTHROSCOPY WITH MEDIAL MENISECTOMY Right 03/21/2021   Procedure: RIGHT KNEE ARTHROSCOPY WITH MEDIAL AND PARTIAL LATERAL MENISCECTOMY;  Surgeon: Tarry Kos, MD;  Location: [facility];  Service: Orthopedics;  Laterality: Right;   LAPAROSCOPIC GASTRIC SLEEVE RESECTION N/A 05/08/2020   Procedure: LAPAROSCOPIC GASTRIC SLEEVE RESECTION;  Surgeon: Sheliah Hatch De Blanch, MD;  Location: [facility];  Service: General;  Laterality: N/A;   LUMBAR DISC SURGERY  04/10/2011   x5-Lumbar fusion-retained hardware.(Dr. Otelia Sergeant)   LUMBAR LAMINECTOMY N/A 10/03/2020   Procedure: THORACOLUMBAR LAMINECTOMIES THORACIC ELEVEN-TWELVE, THORACIC TWELVE-LUMBAR ONE  AND LUMBAT ONE-TWO;  Surgeon: Kerrin Champagne, MD;  Location: [facility];  Service: Orthopedics;  Laterality: N/A;    LUMBAR PERCUTANEOUS PEDICLE SCREW 2 LEVEL  06/29/2021   Procedure: LUMBAR PERCUTANEOUS PEDICLE SCREW LUMBAR ONE-TWO;  Surgeon: Julio Sicks, MD;  Location: [facility];  Service: Neurosurgery;;   MULTIPLE TOOTH EXTRACTIONS     POLYPECTOMY  03/09/2019   Procedure: POLYPECTOMY;  Surgeon: Tressia Danas, MD;  [facility]: [facility];  Service: Gastroenterology;;   POSTERIOR LUMBAR FUSION  04/04/2005   @[facility]  by dr Otelia Sergeant;   L3-- L5   POSTERIOR LUMBAR FUSION  03/04/2006   @[facility]  dr Otelia Sergeant;   fusion L2--3 and redo L3--5   SHOULDER ARTHROSCOPY WITH ROTATOR CUFF REPAIR AND SUBACROMIAL DECOMPRESSION Left 05/07/2019   Procedure: LEFT SHOULDER ARTHROSCOPY WITH DEBRIDEMENT, DISTAL CLAVICLE EXCISION,  SUBACROMIAL DECOMPRESSION AND POSSIBLE ROTATOR CUFF REPAIR;  Surgeon: Tarry Kos, MD;  Location: [facility];  Service: Orthopedics;  Laterality: Left;   TEE WITHOUT CARDIOVERSION N/A 04/06/2012   Procedure: TRANSESOPHAGEAL ECHOCARDIOGRAM (TEE);  Surgeon: Chrystie Nose, MD;  Location: [facility];  Service: Cardiovascular;  Laterality: N/A;   TRIGGER FINGER RELEASE Right    middle finger   TRIGGER FINGER RELEASE Left 06/28/2016   Procedure: RELEASE TRIGGER FINGER LEFT 3RD FINGER;  Surgeon: Tarry Kos, MD;  [facility];  Service: Orthopedics;  Laterality: Left;   TRIGGER FINGER RELEASE Right 06/12/2016   Procedure: RIGHT INDEX FINGER TRIGGER RELEASE;  Surgeon: Tarry Kos, MD;  Location: [facility];  Service: Orthopedics;  Laterality: Right;   TRIGGER FINGER RELEASE Right 01/19/2018   Procedure: RIGHT RING FINGER TRIGGER FINGER RELEASE;  Surgeon: Tarry Kos, MD;  Location: [facility];  Service: Orthopedics;  Laterality: Right;   TRIGGER FINGER RELEASE Left 05/07/2019   Procedure: RELEASE TRIGGER FINGER LEFT INDEX FINGER;  Surgeon: Tarry Kos, MD;  Location[facility];  Service: Orthopedics;  Laterality: Left;   UPPER GI ENDOSCOPY N/A 05/08/2020   Procedure: UPPER GI ENDOSCOPY;  Surgeon: Sheliah Hatch, De Blanch, MD;   Location: [facility];  Service: General;  Laterality:  N/A;   Family History  Problem Relation Age of Onset   Pulmonary embolism Mother    Stroke Father    Alcohol abuse Father    Pneumonia Father    Hypertension Father    Breast cancer Maternal Aunt    Hypertension Other    Diabetes Other        Aunts and cousins   Asthma Sister    Depression Sister    Arthritis Sister    Sickle cell anemia Son    Social History   Socioeconomic History   Marital status: Divorced    Spouse name: Not on file   Number of children: 1   Years of education: 12   Highest education level: 12th grade  Occupational History   Occupation: disabled    Comment: Disabled  Tobacco Use   Smoking status: Every Day    Current packs/day: 0.50    Average packs/day: 0.5 packs/day for 46.2 years (23.1 ttl pk-yrs)    Types: Cigarettes    Start date: 01/28/1977    Passive exposure: Past   Smokeless tobacco: Never  Vaping Use   Vaping status: Never Used  Substance and Sexual Activity   Alcohol use: Not Currently    Comment: hx alcoholism,  (11-26-2021 per pt no alcohol in a while)   Drug use: Not Currently    Comment: 11-26-2021  (hx cocaine use) positive UDS in epic 08-14-2021  pt stated only did it once due to depression 07/ 2023 none since;  per pt last marijuana yrs ago   Sexual activity: Not Currently    Partners: Male    Birth control/protection: Post-menopausal  Other Topics Concern   Not on file  Social History Narrative   Current Social History       Who lives at home: Patient lives alone in one level home; has 4 steps onto porch with a handrail. Has smoke alarms, no throw rugs. Has elevated toilet. Has shower bench in tub with grab rails.    Transportation: Patient has own vehicle and drives herself    Important Relationships "Family and friends"    Pets: None    Likes to eat varied diet. Seafood, fish, roasted [place] breast, vegetables and salads and fruits.   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 I'm a lovable person."                                                                                                   Social Drivers of Health   Financial Resource Strain: Low Risk  (04/28/2023)   Overall Financial Resource Strain (CARDIA)    Difficulty of Paying Living Expenses: Not hard at all  Food Insecurity: No Food Insecurity (04/28/2023)   Hunger Vital Sign  Worried About Programme researcher, broadcasting/film/video in the Last Year: Never true    Ran Out of Food in the Last Year: Never true  Transportation Needs: No Transportation Needs (04/28/2023)   PRAPARE - Administrator, Civil Service (Medical): No    Lack of Transportation (Non-Medical): No  Physical Activity: Inactive (04/28/2023)   Exercise Vital Sign    Days of Exercise per Week: 0 days    Minutes of Exercise per Session: 10 min  Stress: Stress Concern Present (04/28/2023)   [employer] of Occupational Health - Occupational Stress Questionnaire    Feeling of Stress : Very much  Social Connections: Socially Isolated (04/28/2023)   Social Connection and Isolation Panel [NHANES]    Frequency of Communication with Friends and Family: Twice a week    Frequency of Social Gatherings with Friends and Family: Never    Attends Religious Services: Never    Database administrator or Organizations: No    Attends Engineer, structural: Never    Marital Status: Divorced    Tobacco Counseling Ready to quit: Not Answered Counseling given: Not Answered    Clinical Intake:  Pre-visit preparation completed: Yes  Pain : 0-10 Pain Score: 10-Worst pain ever Pain Type: Chronic pain Pain Location: Generalized     BMI - recorded: 32.13 Nutritional Status: BMI > 30   Obese Nutritional Risks: None Diabetes: Yes CBG done?: No Did pt. bring in CBG monitor from home?: No  Lab Results  Component Value Date   HGBA1C 5.9 (H) 09/09/2022   HGBA1C 6.6 (H) 06/10/2022   HGBA1C 7.8 (A) 10/30/2021     How often do you need to have someone help you when you read instructions, pamphlets, or other written materials from your doctor or pharmacy?: 1 - Never What is the last grade level you completed in school?: HSG  Interpreter Needed?: No  Information entered by :: Susie Cassette, LPN.   Activities of Daily Living     04/28/2023    8:46 AM 04/23/2023   11:41 PM  In your present state of health, do you have any difficulty performing the following activities:  Hearing? 0 0  Vision? 0 0  Difficulty concentrating or making decisions? 0 0  Walking or climbing stairs? 1 1  Dressing or bathing? 1 1  Doing errands, shopping? 1 1  Preparing Food and eating ? Y Y  Using the Toilet? N N  In the past six months, have you accidently leaked urine? Y Y  Do you have problems with loss of bowel control? N N  Managing your Medications? N N  Managing your Finances? N N  Housekeeping or managing your Housekeeping? Malvin Johns    Patient Care Team: Jerre Simon, MD as PCP - General (Family Medicine) Jodelle Red, MD as PCP - Cardiology (Cardiology) Jethro Bolus, MD as Consulting Physician (Ophthalmology) Noelle Penner, DMD (Dentistry) Jodelle Red, MD as Consulting Physician (Cardiology) Tarry Kos, MD as Attending Physician (Orthopedic Surgery) Helane Gunther, DPM as Consulting Physician (Podiatry) Liliane Shi Dorian Furnace, MD as Consulting Physician (Urology) Kerrin Champagne, MD (Inactive) as Consulting Physician (Orthopedic Surgery) Huston Foley, MD as Attending Physician (Neurology) Maxie Barb, MD as Consulting Physician (Nephrology) Josephine Igo, DO as Consulting Physician (Pulmonary Disease)  Indicate any recent Medical  Services you may have received from other than [facility] providers in the past year (date may be approximate).     Assessment:   This is a routine wellness examination for  Helmut Muster.  Hearing/Vision screen Hearing Screening - Comments:: Denies hearing difficulties.  Vision Screening - Comments:: Wears rx glasses - up to date with routine eye exams with Jethro Bolus, MD. (Recently retired)    Goals Addressed             This Visit's Progress    Client understands the importance of follow-up with providers by attending scheduled visits         Depression Screen     04/28/2023    8:39 AM 04/14/2023    1:16 PM 01/15/2023    2:18 PM 08/28/2022    9:45 AM 04/02/2022   10:08 AM 02/26/2022    2:07 PM 12/04/2021    1:36 PM  PHQ 2/9 Scores  PHQ - 2 Score 6 6 0 0 0 0 0  PHQ- 9 Score 15 15 3  0  0 0    Fall Risk     04/28/2023    8:38 AM 04/23/2023   11:41 PM 01/15/2023    2:18 PM 08/28/2022    9:45 AM 04/02/2022   10:09 AM  Fall Risk   Falls in the past year? 1 1 0 1 1  Number falls in past yr: 1 1  1 1   Injury with Fall? 1 1  1  0  Risk for fall due to : History of fall(s);Impaired balance/gait;Orthopedic patient   History of fall(s);Impaired balance/gait;Impaired mobility;Orthopedic patient Impaired balance/gait;Impaired mobility  Follow up Falls prevention discussed;Falls evaluation completed   Falls prevention discussed Falls evaluation completed    MEDICARE RISK AT HOME:  Medicare Risk at Home Any stairs in or around the home?: Yes If so, are there any without handrails?: No Home free of loose throw rugs in walkways, pet beds, electrical cords, etc?: Yes Adequate lighting in your home to reduce risk of falls?: Yes Life alert?: No Use of a cane, walker or w/c?: Yes Grab bars in the bathroom?: Yes Shower chair or bench in shower?: Yes Elevated toilet seat or a handicapped toilet?: Yes  TIMED UP AND GO:  Was the test performed?  No  Cognitive Function: 6CIT completed     02/03/2018   10:35 AM  MMSE - Mini Mental State Exam  Orientation to time 5  Orientation to Place 5  Registration 3  Attention/ Calculation 5  Recall 3  Language- name 2 objects 2  Language- repeat 1  Language- follow 3 step command 3  Language- read & follow direction 1  Write a sentence 1  Copy design 1  Total score 30        04/28/2023    8:47 AM 04/02/2022   10:10 AM 07/20/2020    2:44 PM 02/03/2018   10:35 AM  6CIT Screen  What Year? 0 points 0 points  0 points  What month? 0 points 0 points  0 points  What time? 0 points 0 points 0 points 0 points  Count back from 20 0 points 0 points 0 points 0 points  Months in reverse 0 points 0 points 0 points 0 points  Repeat phrase 0 points 0 points 0 points 0 points  Total Score 0 points 0 points  0 points    Immunizations Immunization History  Administered Date(s) Administered   Influenza Split 04/05/2012   Influenza Whole 02/24/2007, 10/23/2007, 12/06/2008, 01/16/2010   Influenza,inj,Quad PF,6+ Mos 11/29/2013, 09/29/2014, 11/23/2015, 11/22/2016, 10/07/2017, 09/18/2018, 01/09/2021, 01/10/2021, 10/30/2021   Influenza-Unspecified 11/11/2019   PFIZER Comirnaty(Gray Top)Covid-19 Tri-Sucrose Vaccine 06/28/2020   PFIZER(Purple Top)SARS-COV-2 Vaccination 04/20/2019,  05/11/2019, 01/11/2020   PNEUMOCOCCAL CONJUGATE-20 01/09/2021   Pfizer Covid-19 Vaccine Bivalent Booster 45yrs & up 03/07/2021   Pneumococcal Conjugate-13 12/27/2016   Pneumococcal Polysaccharide-23 11/28/2001   Td 05/28/2004   Tdap 06/28/2020   Zoster Recombinant(Shingrix) 04/23/2021    Screening Tests Health Maintenance  Topic Date Due   Diabetic kidney evaluation - Urine ACR  Never done   Zoster Vaccines- Shingrix (2 of 2) 06/18/2021   FOOT EXAM  06/09/2022   INFLUENZA VACCINE  08/29/2022   COVID-19 Vaccine (6 - 2024-25 season) 09/29/2022   Lung Cancer Screening  10/16/2022   MAMMOGRAM  01/31/2023   HEMOGLOBIN A1C  03/12/2023   OPHTHALMOLOGY EXAM  04/23/2023    Diabetic kidney evaluation - eGFR measurement  09/13/2023   Medicare Annual Wellness (AWV)  04/27/2024   Cervical Cancer Screening (HPV/Pap Cotest)  12/05/2026   Colonoscopy  03/08/2029   DTaP/Tdap/Td (3 - Td or Tdap) 06/29/2030   Pneumococcal Vaccine 30-96 Years old  Completed   Hepatitis C Screening  Completed   HIV Screening  Completed   HPV VACCINES  Aged Out    Health Maintenance  Health Maintenance Due  Topic Date Due   Diabetic kidney evaluation - Urine ACR  Never done   Zoster Vaccines- Shingrix (2 of 2) 06/18/2021   FOOT EXAM  06/09/2022   INFLUENZA VACCINE  08/29/2022   COVID-19 Vaccine (6 - 2024-25 season) 09/29/2022   Lung Cancer Screening  10/16/2022   MAMMOGRAM  01/31/2023   HEMOGLOBIN A1C  03/12/2023   OPHTHALMOLOGY EXAM  04/23/2023   Health Maintenance Items Addressed: See Nurse Notes  Additional Screening:  Vision Screening: Recommended annual ophthalmology exams for early detection of glaucoma and other disorders of the eye.  Dental Screening: Recommended annual dental exams for proper oral hygiene  Community Resource Referral / Chronic Care Management: CRR required this visit?  No   CCM required this visit?  No     Plan:     I have personally reviewed and noted the following in the patient's chart:   Medical and social history Use of alcohol, tobacco or illicit drugs  Current medications and supplements including opioid prescriptions. Patient is not currently taking opioid prescriptions. Functional ability and status Nutritional status Physical activity Advanced directives List of other physicians Hospitalizations, surgeries, and ER visits in previous 12 months Vitals Screenings to include cognitive, depression, and falls Referrals and appointments  In addition, I have reviewed and discussed with patient certain preventive protocols, quality metrics, and best practice recommendations. A written personalized care plan for preventive  services as well as general preventive health recommendations were provided to patient.     Mary Needy, LPN   1/61/0960   After Visit Summary: (MyChart) Due to this being a telephonic visit, the after visit summary with patients personalized plan was offered to patient via MyChart   Notes: Please refer to Routing Comments.

## 2023-04-29 MED ORDER — SPIRONOLACTONE 25 MG PO TABS: 12.5000 mg | ORAL_TABLET | Freq: Every day | ORAL | 0 refills | Status: DC

## 2023-04-29 MED ORDER — CARVEDILOL 12.5 MG PO TABS: 12.5000 mg | ORAL_TABLET | Freq: Two times a day (BID) | ORAL | 0 refills | Status: DC

## 2023-04-29 MED ORDER — POTASSIUM CHLORIDE CRYS ER 20 MEQ PO TBCR: 20.0000 meq | EXTENDED_RELEASE_TABLET | Freq: Every day | ORAL | 0 refills | Status: DC

## 2023-04-30 ENCOUNTER — Encounter: Payer: Self-pay | Admitting: Student

## 2023-04-30 ENCOUNTER — Ambulatory Visit: Payer: Self-pay | Admitting: Licensed Clinical Social Worker

## 2023-04-30 ENCOUNTER — Ambulatory Visit: Payer: 59 | Admitting: Student

## 2023-04-30 NOTE — Patient Outreach (Signed)
 Care Coordination   Follow Up Visit Note   04/30/2023 Name: [name] MRN: [id number] DOB: Jun 09, 1965  [name] is a 58 y.o. year old female who sees Jerre Simon, MD for primary care. I spoke with  [name] by phone today.  What matters to the patients health and wellness today?  Patient has chronic pain syndrome. She has stress in managing pain issues, has stress in managing medical issues     Goals Addressed             This Visit's Progress    Patient has chronic pain syndrome. She has stress in managing pain issues, has stress in managing medical issues       Interventions:  Spoke with client via phone today about client needs and status Client has chronic pain syndrome. She has stress in managing pain issues. She also has stress in managing medical issues faced. Discussed program support with RN, LCSW, Pharmacist Client sees Dr. Jerre Simon as PCP Recently Dr. Elliot Gurney referred client to psychiatry for mental health support. Dr. Elliot Gurney also has provided client a list of counselors in the area for her to consider for possible counseling support Client has [facility] insurance and Medicaid (Dual complete) LCSW discussed with client medication procurement for client.  LCSW discussed sleeping issues of client. Client said she has decreased sleep occasionally Client also said she was not feeling well today and asked if Care Guide, Gwenevere Ghazi, could please call client back to reschedule LCSW phone visit with client. LCSW agreed to plan and LCSW will request [facility] Guide to call [name] to reschedule LCSW phone visit with client. Client has LCSW contact number of [phone] Thanked client for phone call with LCSW today [name] was appreciative of call today from LCSW          SDOH assessments and interventions completed:  Yes  SDOH Interventions Today    Flowsheet Row Most Recent Value  SDOH Interventions   Depression Interventions/Treatment   Referral to Psychiatry, Medication, Counseling  Physical Activity Interventions Other (Comments)  Stress Interventions Other (Comment)  [client trying to manage pain issues faced. trying to manage medical issues faced]        Care Coordination Interventions:  Yes, provided   Interventions Today    Flowsheet Row Most Recent Value  Chronic Disease   Chronic disease during today's visit Other  [spoke with client about client needs]  General Interventions   General Interventions Discussed/Reviewed General Interventions Discussed, Community Resources  Education Interventions   Education Provided Provided Education  Provided Verbal Education On [employer]  Mental Health Interventions   Mental Health Discussed/Reviewed Coping Strategies  [client has stress in managing chronic pain issues. has stress in managing medical needs]  Nutrition Interventions   Nutrition Discussed/Reviewed Nutrition Discussed  Pharmacy Interventions   Pharmacy Dicussed/Reviewed Pharmacy Topics Discussed  Safety Interventions   Safety Discussed/Reviewed Fall Risk        Follow up plan: LCSW to contact Gwenevere Ghazi , Care Guide , to request that Gwenevere Ghazi please call client to reschedule LCSW phone visit with client (this is at request of client, [name])   Encounter Outcome:  Patient Visit Completed    Lorna Few  MSW, LCSW [place] Licensed Clinical Social Worker Direct Dial:  [phone] Fax:  [phone] Website:  [email or web address]

## 2023-04-30 NOTE — Patient Instructions (Signed)
 Visit Information  Thank you for taking time to visit with me today. Please don't hesitate to contact me if I can be of assistance to you.   Following are the goals we discussed today:   Goals Addressed             This Visit's Progress    Patient has chronic pain syndrome. She has stress in managing pain issues, has stress in managing medical issues       Interventions:  Spoke with client via phone today about client needs and status Client has chronic pain syndrome. She has stress in managing pain issues. She also has stress in managing medical issues faced. Discussed program support with RN, LCSW, Pharmacist Client sees Dr. Jerre Simon as PCP Recently Dr. Elliot Gurney referred client to psychiatry for mental health support. Dr. Elliot Gurney also has provided client a list of counselors in the area for her to consider for possible counseling support Client has [facility] insurance and Medicaid (Dual complete) LCSW discussed with client medication procurement for client.  LCSW discussed sleeping issues of client. Client said she has decreased sleep occasionally Client also said she was not feeling well today and asked if Care Guide, Gwenevere Ghazi, could please call client back to reschedule LCSW phone visit with client. LCSW agreed to plan and LCSW will request [facility] Guide to call [place] to reschedule LCSW phone visit with client. Client has LCSW contact number of [phone] Thanked client for phone call with LCSW today Tysha was appreciative of call today from LCSW         LCSW to contact Gwenevere Ghazi, Care Guide, to request that Gwenevere Ghazi please reschedule LCSW phone visit with client, This is at request of client, VIVI PICCIRILLI  Please call the care guide team at [phone] if you need to cancel or reschedule your appointment.   If you are experiencing a Mental Health or Behavioral Health Crisis or need someone to talk to, please go to [facility]  Urgent Care [address] [place] [phone])   The patient verbalized understanding of instructions, educational materials, and care plan provided today and DECLINED offer to receive copy of patient instructions, educational materials, and care plan.   The patient has been provided with contact information for the care management team and has been advised to call with any health related questions or concerns.    Lorna Few  MSW, LCSW [place] Licensed Clinical Social Worker Direct Dial:  [phone] Fax:  [phone] Website:  [email or web address]

## 2023-05-02 NOTE — Progress Notes (Signed)
 Complex Care Management Care Guide Note  05/02/2023 Name: [name] MRN: [id number] DOB: Apr 27, 1965  [name] is a 58 y.o. year old female who is a primary care patient of Jerre Simon, MD and is actively engaged with the care management team. I reached out to [name] by phone today to assist with re-scheduling  with the Licensed Clinical Child psychotherapist.  Follow up plan: Telephone appointment with complex care management team member scheduled for:  4/16  Gwenevere Ghazi  [facility]  [facility], [facility] Guide  Direct Dial: [phone]  Fax [phone]

## 2023-05-05 ENCOUNTER — Ambulatory Visit: Payer: 59 | Admitting: Podiatry

## 2023-05-08 ENCOUNTER — Encounter: Payer: 59 | Admitting: Emergency Medicine

## 2023-05-12 ENCOUNTER — Other Ambulatory Visit: Payer: Self-pay | Admitting: Student

## 2023-05-12 DIAGNOSIS — J4489 Other specified chronic obstructive pulmonary disease: Secondary | ICD-10-CM

## 2023-05-12 DIAGNOSIS — I5032 Chronic diastolic (congestive) heart failure: Secondary | ICD-10-CM

## 2023-05-14 ENCOUNTER — Ambulatory Visit: Payer: Self-pay | Admitting: Licensed Clinical Social Worker

## 2023-05-14 NOTE — Patient Outreach (Signed)
 Complex Care Management   Visit Note  05/14/2023  Name:  MEKHIA BROGAN MRN: [id number] DOB: May 18, 1965  Situation: Referral received for Complex Care Management related to  client management of ongoing medical needs.   I obtained verbal consent from Patient.  Visit completed with client  on the phone  Background:   Past Medical History:  Diagnosis Date   Alcoholism in remission (HCC)    11-26-2021  pt stated no alcohol few yrs ago   Asthma    Bipolar disorder (HCC)    Chronic diastolic CHF (congestive heart failure) (HCC) 2014   followed by dr b. Cristal Deer;   Chronic hypoxemic respiratory failure [facility])    pulmonologist--- dr Tonia Brooms;   (11-26-2021 per pt was on supplemental oxygen until she 01/ 2022 did not need it,  does not check O2 sats at home anymore.   CKD (chronic kidney disease), stage III [facility])    nephrologist--- dr Ronalee Belts   COPD (chronic obstructive pulmonary disease) (HCC)    11-26-2021 pt trying to quit smoking ,  started age 58   Depression    Diabetes mellitus type 2, diet-controlled (HCC)    followed by pcp   (11-26-2021  per pt last A1c by pcp on 10-30-2021 7.8,  stated does not check blood sugar)   Dyspnea    Fibrocystic breast disease    GAD (generalized anxiety disorder)    GERD (gastroesophageal reflux disease)    Hepatitis B surface antigen positive 11/2017   pt stated asymptomatic ,  no treatment   History of cervical dysplasia 04/10/2011   '93- once dx.-got pregnant-no intervention, then postpartum, no dysplasia found   History of condyloma acuminatum 04/09/2011   Hyperlipidemia    Hypertension    Incomplete left bundle branch block (LBBB)    Lung nodule 11/2017   pulmonologist--- dr Tonia Brooms;  RUL and RLL   Muscle spasm of both lower legs    chronic , due to back issues,   OA (osteoarthritis) 04/10/2011   hips, shoulders, back   OSA (obstructive sleep apnea) 2009   study on epic 09-18-2007 mild osa w/ hypoxmia ;   (11-26-2021 per pt last used cpap  01/ 2022 before bariatric surgery 04/ 2022 stated lost wt / and stopped needing supplemental oxygen , did not need anymore)   Polyneuropathy    due to back issues and DM   Polysubstance abuse (HCC)    11-26-2021  pt hx cocaine use stated none for yrs but ED visit 08-14-2021  positive UDS,  pt stated it was once only for depression none since   PSVT (paroxysmal supraventricular tachycardia) (HCC)    followed by cardiology   Spinal stenosis, unspecified spinal region    Urge incontinence of urine    Wears dentures    upper    Assessment: Patient Reported Symptoms:  Cognitive    No issues noted    Neurological  Ongoing pain issues. Anxiety    HEENT    No issues noted    Cardiovascular  HTN; CHF Weight:  (wt is stable per client)  Respiratory    Some fatigue when walking; COPD  Endocrine  Unable to assess    Gastrointestinal    GERD    Genitourinary  Frequent bathroom visits at night    Integumentary  Unable to assess     Musculoskeletal    Difficulty in walking. Uses a walker to help her walk. Back pain. Ongoing pain issues (generalized pain)      Psychosocial  Some decrease in family support. Depression issues   Quality of Family Relationships: supportive Do you feel physically threatened by others?: No      05/14/2023   11:48 AM  Depression screen PHQ 2/9  Decreased Interest 1  Down, Depressed, Hopeless 1  PHQ - 2 Score 2  Altered sleeping 1  Tired, decreased energy 1  Change in appetite 0  Feeling bad or failure about yourself  1  Trouble concentrating 1  Moving slowly or fidgety/restless 1  Suicidal thoughts 0  PHQ-9 Score 7  Difficult doing work/chores Somewhat difficult    Vitals:   Client unable to give information update Medications Reviewed Today     Reviewed by Afton Horse (Social Worker) on 05/14/23 at 1146  Med List Status: <None>   Medication Order Taking? Sig Documenting Provider Last Dose Status Informant   acetaminophen (TYLENOL) 500 MG tablet [id number] No Take 1,000 mg by mouth 2 (two) times daily. [provider] Taking Active Self, Pharmacy Records  albuterol (VENTOLIN HFA) 108 (90 Base) MCG/ACT inhaler [id number]  INHALE TWO PUFFS INTO THE LUNGS EVERY SIX HOURS AS NEEDED WHEEZING OR For SHORTNESS OF Addison Holster, MD  Active   ammonium lactate (LAC-HYDRIN) 12 % lotion [id number] No For dry skin  Patient taking differently: Apply 1 Application topically 2 (two) times daily. For dry skin   Goble Last, MD Taking Active Self, Pharmacy Records  atorvastatin (LIPITOR) 40 MG tablet [id number]  Take 1 tablet (40 mg total) by mouth daily. Goble Last, MD  Active   baclofen (LIORESAL) 10 MG tablet [id number]  TAKE ONE TABLET BY MOUTH THREE TIMES DAILY AS NEEDED muscle SPASMS Wilhemena Harbour, MD  Active   Buprenorphine HCl (BELBUCA) 900 MCG FILM [id number] No Place 900 mcg inside cheek 2 (two) times daily. [provider] Taking Active Self, Pharmacy Records  busPIRone (BUSPAR) 10 MG tablet [id number]  Take 1 tablet (10 mg total) by mouth 3 (three) times daily. Goble Last, MD  Active   carvedilol (COREG) 12.5 MG tablet [id number]  Take 1 tablet (12.5 mg total) by mouth 2 (two) times daily with a meal. Goble Last, MD  Active   cloNIDine (CATAPRES) 0.2 MG tablet [id number] No Take 1 tablet (0.2 mg total) by mouth 2 (two) times daily. Veronia Goon, DO Taking Active   diclofenac Sodium (VOLTAREN) 1 % GEL [id number]  Apply 4 g topically 4 (four) times daily as needed. Goble Last, MD  Active   DILT-XR 120 MG 24 hr capsule [id number]  TAKE ONE CAPSULE BY MOUTH TWICE DAILY related TO essential primary hypertension Wilhemena Harbour, MD  Active   empagliflozin (JARDIANCE) 10 MG TABS tablet [id number]  TAKE ONE TABLET BY MOUTH EVERY DAY related TO type2 diabetes Goble Last, MD  Active   fluticasone (FLONASE) 50 MCG/ACT nasal spray [id number]  instill TWO SPRAYS IN EACH NOSTRIL TWICE DAILY  Goble Last, MD  Active   furosemide (LASIX) 80 MG tablet [id number]  TAKE ONE TABLET BY MOUTH TWICE DAILY RELATED TO SYSTOLIC (CONGESTIVE HEART FAILURE) Goble Last, MD  Active   gabapentin (NEURONTIN) 300 MG capsule [id number]  Take 1 capsule (300 mg total) by mouth 3 (three) times daily. Rawland Caddy, MD  Active   lidocaine (LIDODERM) 5 % [id number] No Place 1 patch onto the skin daily. Remove & Discard patch within 12 hours or as directed by MD Veronia Goon, DO Taking Active   loratadine (CLARITIN) 10 MG tablet [id number]  TAKE ONE TABLET BY  MOUTH EVERY DAY FOR allergies Goble Last, MD  Active   losartan (COZAAR) 100 MG tablet [id number]  Take 1 tablet (100 mg total) by mouth daily. Goble Last, MD  Active   losartan (COZAAR) 25 MG tablet [id number]  TAKE ONE TABLET BY MOUTH EVERY DAY Goble Last, MD  Active   methocarbamol (ROBAXIN) 500 MG tablet [id number] No Take 500 mg by mouth every 6 (six) hours as needed for muscle spasms. [provider] Taking Active Self, Pharmacy Records  montelukast (SINGULAIR) 10 MG tablet [id number]  TAKE ONE TABLET BY MOUTH AT BEDTIME Goble Last, MD  Active   Multiple Vitamins-Minerals (MULTIVITAMIN ADULTS 50+) TABS [id number] No Take 1 tablet by mouth daily. [provider] Taking Active Self, Pharmacy Records  NARCAN 4 MG/0.1ML LIQD nasal spray kit [id number] No Place 0.4 mg into the nose once as needed (accidental overdose). [provider] Taking Active Self, Pharmacy Records           Med Note Irby Mannan   Sat Jun 30, 2021 10:54 AM)    omeprazole (PRILOSEC) 20 MG capsule [id number] No Take 1 capsule (20 mg total) by mouth daily. Limmie Ren, MD Taking Active   potassium chloride SA (KLOR-CON M) 20 MEQ tablet [id number]  Take 1 tablet (20 mEq total) by mouth daily for 30 doses. Goble Last, MD  Active   spironolactone (ALDACTONE) 25 MG tablet [id number]  Take 0.5 tablets (12.5 mg total) by mouth daily. Goble Last, MD  Active   TRELEGY ELLIPTA 100-62.5-25 MCG/ACT AEPB [id number]  INHALE ONE PUFF into THE lung EVERY DAY related TO acute AND chronic respiratory failure Goble Last, MD  Active             Recommendation:   Client to talk with PCP at next PCP visit about physical therapy support for client  Client to take medications as prescribed Client to attend scheduled medical appointments Client to contact home health agency to talk with agency about in home support for client Client to use coping skills to help her manage depression symptoms  Follow Up Plan: LCSW to call client on Jun 02, 2023 at 3:30 pm    Alexandria Angel  MSW, LCSW [place] Licensed Clinical Social Worker Direct Dial:  [phone] Fax:  [phone] Website:  [email or web address]

## 2023-05-14 NOTE — Patient Instructions (Signed)
 Visit Information  Thank you for taking time to visit with me today. Please don't hesitate to contact me if I can be of assistance to you before our next scheduled appointment.  Our next appointment is by telephone on 06/02/23 AT 3:30 pm  Please call the care guide team at [phone] if you need to cancel or reschedule your appointment.   Following is a copy of your care plan:   Goals Addressed             This Visit's Progress    VBCI Social Work Care Plan       Problems:     Risk for Falls. EMS has been involved in helping client in the home when she falls Depression issues Reduced in home support Sleeping challenges Pain issues (generalized pain) Transport challenges (uses [employer] transport occasionally) Mobility issues Frequent bathroom visits at night  CSW Clinical Goal(s):   Over the next 30  days the Patient will attend all scheduled medical appointments as evidenced by patient report and care team review of appointment completion in electronic MEDICAL RECORD NUMBER  demonstrate improved health management independence as evidenced by client functioning daily with ongoing pain issues .  She takes pain medications as prescribed.               Over next 30 days, patient will manage depression issues AEB using coping skills learned to manage these symptoms (likes to listen to music, talk with family via phone, watches TV)  Interventions:  Discussed ambulation of client, She uses rollator walker as needed             Discussed medication procurement             Discussed pain issues of client             Discussed support in home environment. She has a friend who helps her currently for in home support. She is planning to talk again soon with home health agency of choice to help with in home care             Discussed transport needs             Discussed sleeping issues. She has decreased sleep. Discussed appetite of client             Discussed upcoming client appointments.                Discussed client support with PCP, Dr. Jerre Simon              Discussed client exercise. She walks for exercise and sits up to strengthen her back              Client said she plans to talk with Dr. Elliot Gurney on next visit with PCP about Physical Therapy support for client                Discussed client insurance [facility] and Medicaid (Dual Complete). She has U card she uses for support (food and utilities)               Provided counseling support for client                Patient Goals/Self-Care Activities:  Client will continue to walk for exercise as she is able. She will do stretching exercise as able            Client will take medications as prescribed  Client will attend scheduled medical appointments. She has appointment with PCP on May 30, 2023.              Client will talk with home health agency to try to arrange for in home support for client.                Communicate with LCSW as needed for SW support               Client to talk with PCP at next PCP visit about physical therapy support               Plan:   LCSW to call client on Jun 02, 2023 at 3:30 PM         Please go to [facility] [address], [place] [phone]) if you are experiencing a Mental Health or Behavioral Health Crisis or need someone to talk to.  The patient verbalized understanding of instructions, educational materials, and care plan provided today and DECLINED offer to receive copy of patient instructions, educational materials, and care plan.   Patient was provided information on how to contact care team for support. LCSW provided client with LCSW name and phone number for her use for SW support   Alexandria Angel  MSW, LCSW [place] Licensed Clinical Social Worker Direct Dial:  [phone] Fax:  [phone] Website:  [email or web address]

## 2023-05-19 ENCOUNTER — Encounter: Payer: Self-pay | Admitting: Student

## 2023-05-20 ENCOUNTER — Other Ambulatory Visit: Payer: Self-pay | Admitting: Student

## 2023-05-20 DIAGNOSIS — E1151 Type 2 diabetes mellitus with diabetic peripheral angiopathy without gangrene: Secondary | ICD-10-CM

## 2023-05-20 DIAGNOSIS — B353 Tinea pedis: Secondary | ICD-10-CM

## 2023-05-20 MED ORDER — AMMONIUM LACTATE 12 % EX LOTN: TOPICAL_LOTION | CUTANEOUS | 0 refills | Status: AC

## 2023-05-20 MED ORDER — NYSTATIN 100000 UNIT/ML MT SUSP: 5.0000 mL | Freq: Four times a day (QID) | OROMUCOSAL | 0 refills | Status: DC

## 2023-05-20 NOTE — Progress Notes (Signed)
Refilled patient's meds

## 2023-05-26 ENCOUNTER — Other Ambulatory Visit: Payer: Self-pay | Admitting: Student

## 2023-05-27 ENCOUNTER — Other Ambulatory Visit: Payer: Self-pay | Admitting: Student

## 2023-05-27 DIAGNOSIS — J4489 Other specified chronic obstructive pulmonary disease: Secondary | ICD-10-CM

## 2023-05-30 ENCOUNTER — Ambulatory Visit: Admitting: Student

## 2023-06-02 ENCOUNTER — Other Ambulatory Visit: Payer: Self-pay | Admitting: Licensed Clinical Social Worker

## 2023-06-02 NOTE — Patient Outreach (Signed)
 Complex Care Management   Visit Note  06/02/2023  Name:  [name] MRN: [id number] DOB: 10/28/1965  Situation: Referral received for Complex Care Management related to  client management of stress issues; client management of medical issues.   I obtained verbal consent from Patient.  Visit completed with patient  on the phone  Background:   Past Medical History:  Diagnosis Date   Alcoholism in remission (HCC)    11-26-2021  pt stated no alcohol few yrs ago   Asthma    Bipolar disorder (HCC)    Chronic diastolic CHF (congestive heart failure) (HCC) 2014   followed by dr b. Veryl Gottron;   Chronic hypoxemic respiratory failure [facility])    pulmonologist--- dr Thelda Finney;   (11-26-2021 per pt was on supplemental oxygen  until she 01/ 2022 did not need it,  does not check O2 sats at home anymore.   CKD (chronic kidney disease), stage III [facility])    nephrologist--- dr Edson Graces   COPD (chronic obstructive pulmonary disease) (HCC)    11-26-2021 pt trying to quit smoking ,  started age 49   Depression    Diabetes mellitus type 2, diet-controlled (HCC)    followed by pcp   (11-26-2021  per pt last A1c by pcp on 10-30-2021 7.8,  stated does not check blood sugar)   Dyspnea    Fibrocystic breast disease    GAD (generalized anxiety disorder)    GERD (gastroesophageal reflux disease)    Hepatitis B surface antigen positive 11/2017   pt stated asymptomatic ,  no treatment   History of cervical dysplasia 04/10/2011   '93- once dx.-got pregnant-no intervention, then postpartum, no dysplasia found   History of condyloma acuminatum 04/09/2011   Hyperlipidemia    Hypertension    Incomplete left bundle branch block (LBBB)    Lung nodule 11/2017   pulmonologist--- dr Thelda Finney;  RUL and RLL   Muscle spasm of both lower legs    chronic , due to back issues,   OA (osteoarthritis) 04/10/2011   hips, shoulders, back   OSA (obstructive sleep apnea) 2009   study on epic 09-18-2007 mild osa w/ hypoxmia ;    (11-26-2021 per pt last used cpap 01/ 2022 before bariatric surgery 04/ 2022 stated lost wt / and stopped needing supplemental oxygen  , did not need anymore)   Polyneuropathy    due to back issues and DM   Polysubstance abuse (HCC)    11-26-2021  pt hx cocaine use stated none for yrs but ED visit 08-14-2021  positive UDS,  pt stated it was once only for depression none since   PSVT (paroxysmal supraventricular tachycardia) (HCC)    followed by cardiology   Spinal stenosis, unspecified spinal region    Urge incontinence of urine    Wears dentures    upper    Assessment: Patient Reported Symptoms:  Cognitive    Altered Mental State    Neurological    Anxiety and stress issues; depression  HEENT      Wears glasses to help with vision  Cardiovascular  HTN: CHF    Respiratory    CHF; OSA  Endocrine  No issues mentioned by client    Gastrointestinal    GERD    Genitourinary  Urge incontinence of urine    Integumentary    Unable to access  Musculoskeletal    Uses a walker or a cane as needed to help her walk. At risk for fall. Back pain issues. Plans to talk on  06/06/23 with PCP about HHPT possibly for client      Psychosocial    Pain issues; anxiety issues; depression issues; takes time to complete ADLs   Quality of Family Relationships: supportive Do you feel physically threatened by others?: No      06/02/2023   11:24 AM  Depression screen PHQ 2/9  Decreased Interest 1  Down, Depressed, Hopeless 1  PHQ - 2 Score 2  Altered sleeping 1  Tired, decreased energy 1  Change in appetite 1  Feeling bad or failure about yourself  1  Trouble concentrating 1  Moving slowly or fidgety/restless 1  Suicidal thoughts 0  PHQ-9 Score 8  Difficult doing work/chores Somewhat difficult    Vitals:  Client unable to provide information about BP level of client  Medications Reviewed Today     Reviewed by Afton Horse (Social Worker) on 06/02/23 at 1121  Med List  Status: <None>   Medication Order Taking? Sig Documenting Provider Last Dose Status Informant  acetaminophen  (TYLENOL ) 500 MG tablet [id number] No Take 1,000 mg by mouth 2 (two) times daily. [provider] Taking Active Self, Pharmacy Records  albuterol  (VENTOLIN  HFA) [phone] Base) MCG/ACT inhaler [id number]  INHALE TWO PUFFS INTO THE LUNGS EVERY SIX HOURS AS NEEDED WHEEZING OR For SHORTNESS OF Addison Holster, MD  Active   ammonium lactate  (LAC-HYDRIN ) 12 % lotion [id number]  For dry skin Goble Last, MD  Active   atorvastatin  (LIPITOR) 40 MG tablet [id number]  Take 1 tablet (40 mg total) by mouth daily. Goble Last, MD  Active   baclofen  (LIORESAL ) 10 MG tablet [id number]  TAKE ONE TABLET BY MOUTH THREE TIMES DAILY AS NEEDED muscle SPASMS Wilhemena Harbour, MD  Active   Buprenorphine  HCl (BELBUCA ) 900 MCG FILM [id number] No Place 900 mcg inside cheek 2 (two) times daily. [provider] Taking Active Self, Pharmacy Records  busPIRone  (BUSPAR ) 10 MG tablet [id number]  Take 1 tablet (10 mg total) by mouth 3 (three) times daily. Goble Last, MD  Active   carvedilol  (COREG ) 12.5 MG tablet [id number]  Take 1 tablet (12.5 mg total) by mouth 2 (two) times daily with a meal. Goble Last, MD  Active   cloNIDine  (CATAPRES ) 0.2 MG tablet [id number] No Take 1 tablet (0.2 mg total) by mouth 2 (two) times daily. Veronia Goon, DO Taking Active   diclofenac  Sodium (VOLTAREN ) 1 % GEL [id number]  APPLY FOUR grams topically FOUR TIMES DAILY Goble Last, MD  Active   DILT-XR 120 MG 24 hr capsule [id number]  TAKE ONE CAPSULE BY MOUTH TWICE DAILY related TO essential primary hypertension Wilhemena Harbour, MD  Active   empagliflozin  (JARDIANCE ) 10 MG TABS tablet [id number]  TAKE ONE TABLET BY MOUTH EVERY DAY related TO type2 diabetes Goble Last, MD  Active   fluticasone  (FLONASE ) 50 MCG/ACT nasal spray [id number]  instill TWO SPRAYS IN EACH NOSTRIL TWICE DAILY Goble Last, MD  Active    Fluticasone -Umeclidin-Vilant (TRELEGY ELLIPTA ) 100-62.5-25 MCG/ACT AEPB [id number]  INHALE ONE PUFF into THE lung EVERY DAY related TO acute AND chronic respiratory failure Goble Last, MD  Active   furosemide  (LASIX ) 80 MG tablet [id number]  TAKE ONE TABLET BY MOUTH TWICE DAILY RELATED TO SYSTOLIC (CONGESTIVE HEART FAILURE) Goble Last, MD  Active   gabapentin  (NEURONTIN ) 300 MG capsule [id number]  Take 1 capsule (300 mg total) by mouth 3 (three) times daily. Rawland Caddy, MD  Active   lidocaine  (LIDODERM ) 5 % [id number] No Place 1 patch onto  the skin daily. Remove & Discard patch within 12 hours or as directed by MD Veronia Goon, DO Taking Active   loratadine  (CLARITIN ) 10 MG tablet [id number]  TAKE ONE TABLET BY MOUTH EVERY DAY FOR allergies Goble Last, MD  Active   losartan  (COZAAR ) 100 MG tablet [id number]  Take 1 tablet (100 mg total) by mouth daily. Goble Last, MD  Active   losartan  (COZAAR ) 25 MG tablet [id number]  TAKE ONE TABLET BY MOUTH EVERY DAY Goble Last, MD  Active   methocarbamol  (ROBAXIN ) 500 MG tablet [id number] No Take 500 mg by mouth every 6 (six) hours as needed for muscle spasms. [provider] Taking Active Self, Pharmacy Records  montelukast  (SINGULAIR ) 10 MG tablet [id number]  TAKE ONE TABLET BY MOUTH AT BEDTIME Goble Last, MD  Active   Multiple Vitamins-Minerals (MULTIVITAMIN ADULTS 50+) TABS [id number] No Take 1 tablet by mouth daily. [provider] Taking Active Self, Pharmacy Records  NARCAN  4 MG/0.1ML LIQD nasal spray kit [id number] No Place 0.4 mg into the nose once as needed (accidental overdose). [provider] Taking Active Self, Pharmacy Records           Med Note Neila Bally, Asa Bjork   Sat Jun 30, 2021 10:54 AM)    nystatin  (MYCOSTATIN ) 100000 UNIT/ML suspension [id number]  Take 5 mLs (500,000 Units total) by mouth 4 (four) times daily. Goble Last, MD  Active   omeprazole  (PRILOSEC) 20 MG capsule [id number] No Take 1 capsule  (20 mg total) by mouth daily. Limmie Ren, MD Taking Active   potassium chloride  SA (KLOR-CON  M) 20 MEQ tablet [id number]  Take 1 tablet (20 mEq total) by mouth daily for 30 doses. Goble Last, MD  Active   spironolactone  (ALDACTONE ) 25 MG tablet [id number]  Take 0.5 tablets (12.5 mg total) by mouth daily. Goble Last, MD  Active             Recommendation:   PCP Follow-up on Jun 06, 2023 Client to take medications as prescribed Client to attend all scheduled medical appointments Client to talk with PCP on 06/06/23 client appointment with PCP about HHPT for client Client to review list of counselors provided by PCP and consider phone counseling support for client Client to use coping skills to manage depression issues and anxiety issues  Follow Up Plan:   LCSW to call client on 07/07/23 at 2:30 PM    Alexandria Angel  MSW, LCSW [place]/Value Based Care [facility] Licensed Clinical Social Worker Direct Dial :  [phone] Fax:  [phone] Website:  [email or web address]

## 2023-06-02 NOTE — Patient Instructions (Signed)
 Visit Information  Thank you for taking time to visit with me today. Please don't hesitate to contact me if I can be of assistance to you before our next scheduled appointment.  Our next appointment is by telephone on 07/07/23 at 2:30 PM   Please call the care guide team at [phone] if you need to cancel or reschedule your appointment.   Following is a copy of your care plan:   Goals Addressed             This Visit's Progress    VBCI Social Work Care Plan       Problems:   Pain issues           Transportation challenges            Needs time to complete ADLs            Depression issues   CSW Clinical Goal(s):   Over the next 30 days the Patient will attend all scheduled medical appointments as evidenced by patient report and care team review of appointment completion in electronic MEDICAL RECORD NUMBER  .            Over next 30 days, the Patient will use coping skills learned to manage depression symptoms AEB patient report of reduction in depression symptoms .  Interventions:  Discussed medication procurement of client.               Discussed sleeping issues of client             Discussed client upcoming appointment with PCP on Jun 06, 2023.  Client plans to talk with PCP at appointment  about HHPT for client.            Discussed counseling support for client.  PCP had provided client with list of counselors in the area. LCSW encouraged client to review list of counselors and consider seeking counselor for phone counseling support for client. Client said she would think about this           Discussed pain management of client; discussed vision of client. Discussed transport needs of client            Discussed ADLs completion of client. Discussed ambulation of client. She said she tries to walk in the home area for some exercise.            Encouraged client to call LCSW as needed for support at [phone].        Patient Goals/Self-Care Activities:  Patient to take  medications as prescribed             Patient to attend scheduled medical appointments              Patient to talk with PCP on May 9,2025 PCP appointment about HHPT for client              Client to review list of counselors provided by PCP and consider phone support counseling for client              Client to allow time for ADLs completion              Client to use coping skills to manage depression symptoms               Patient to call LCSW as needed for SW support  Plan:   LCSW to call client on 07/07/23 at 2:30 PM         Please  go to [facility] [address], [place] [phone]) if you are experiencing a Mental Health or Behavioral Health Crisis or need someone to talk to.  The patient verbalized understanding of instructions, educational materials, and care plan provided today and DECLINED offer to receive copy of patient instructions, educational materials, and care plan.   Patient provided contact information for Care Team.  LCSW gave client LCSW name and phone number and encouraged Qadirah to call LCSW as needed for SW support   Alexandria Angel  MSW, LCSW [place]/Value Based Care [facility] Licensed Clinical Social Worker Direct Dial :  [phone] Fax:  [phone] Website:  [email or web address]

## 2023-06-04 ENCOUNTER — Other Ambulatory Visit: Payer: Self-pay | Admitting: Student

## 2023-06-06 ENCOUNTER — Ambulatory Visit: Admitting: Student

## 2023-06-09 ENCOUNTER — Other Ambulatory Visit: Payer: Self-pay | Admitting: Student

## 2023-06-09 DIAGNOSIS — K219 Gastro-esophageal reflux disease without esophagitis: Secondary | ICD-10-CM

## 2023-06-09 MED ORDER — OMEPRAZOLE 20 MG PO CPDR: 20.0000 mg | DELAYED_RELEASE_CAPSULE | Freq: Every day | ORAL | 3 refills | Status: AC

## 2023-06-10 ENCOUNTER — Other Ambulatory Visit: Payer: Self-pay | Admitting: Student

## 2023-06-10 ENCOUNTER — Ambulatory Visit: Admitting: Student

## 2023-06-12 ENCOUNTER — Other Ambulatory Visit: Payer: Self-pay | Admitting: Podiatry

## 2023-06-13 ENCOUNTER — Other Ambulatory Visit: Payer: Self-pay | Admitting: Student

## 2023-06-16 ENCOUNTER — Other Ambulatory Visit: Payer: Self-pay | Admitting: Student

## 2023-06-17 ENCOUNTER — Other Ambulatory Visit: Payer: Self-pay | Admitting: Student

## 2023-06-18 ENCOUNTER — Other Ambulatory Visit: Payer: Self-pay | Admitting: Student

## 2023-06-25 ENCOUNTER — Other Ambulatory Visit: Payer: Self-pay | Admitting: Student

## 2023-07-07 ENCOUNTER — Encounter: Payer: Self-pay | Admitting: Licensed Clinical Social Worker

## 2023-07-07 ENCOUNTER — Telehealth: Payer: Self-pay | Admitting: Licensed Clinical Social Worker

## 2023-07-10 ENCOUNTER — Encounter: Payer: Self-pay | Admitting: Student

## 2023-07-15 ENCOUNTER — Other Ambulatory Visit: Payer: Self-pay | Admitting: Student

## 2023-07-15 ENCOUNTER — Other Ambulatory Visit: Payer: Self-pay | Admitting: Podiatry

## 2023-07-15 DIAGNOSIS — F418 Other specified anxiety disorders: Secondary | ICD-10-CM

## 2023-07-15 MED ORDER — VENLAFAXINE HCL ER 75 MG PO CP24: 75.0000 mg | ORAL_CAPSULE | Freq: Every day | ORAL | 6 refills | Status: DC

## 2023-07-15 NOTE — Progress Notes (Signed)
 Spoke and verified with patient that she is currently taking her Effexor  75 mg daily.  Called patient's pharmacy to confirm patient has been getting a refill of this medication.  Refilled patient's Effexor 

## 2023-07-18 ENCOUNTER — Encounter: Payer: Self-pay | Admitting: Student

## 2023-07-21 ENCOUNTER — Ambulatory Visit: Admitting: Student

## 2023-07-22 ENCOUNTER — Telehealth: Payer: Self-pay | Admitting: *Deleted

## 2023-07-22 ENCOUNTER — Ambulatory Visit: Admitting: Orthopaedic Surgery

## 2023-07-22 NOTE — Progress Notes (Unsigned)
 Complex Care Management Care Guide Note  07/22/2023 Name: [name] MRN: [id number] DOB: 04-Dec-1965  [name] is a 58 y.o. year old female who is a primary care patient of Rosendo Rush, MD and is actively engaged with the care management team. I reached out to [name] by phone today to assist with re-scheduling  with the Licensed Clinical Child psychotherapist.  Follow up plan: Unsuccessful telephone outreach attempt made. A HIPAA compliant phone message was left for the patient providing contact information and requesting a return call.  Harlene Satterfield  [name]  Value-Based Care Institute, [facility] Guide  Direct Dial : [phone]  Fax [phone]

## 2023-07-23 ENCOUNTER — Other Ambulatory Visit: Payer: Self-pay | Admitting: Student

## 2023-07-24 NOTE — Progress Notes (Signed)
 Complex Care Management Care Guide Note  07/24/2023 Name: CHEYRL BULEY MRN: [id number] DOB: [birth year]  Bernarda LILLETTE Fanti is a 58 y.o. year old female who is a primary care patient of Rosendo Rush, MD and is actively engaged with the care management team. I reached out to Bernarda LILLETTE Fanti by phone today to assist with re-scheduling  with the Licensed Clinical Child psychotherapist.  Follow up plan: Unsuccessful telephone outreach attempt made. A HIPAA compliant phone message was left for the patient providing contact information and requesting a return call. No further outreach attempts will be made at this time. We have been unable to contact the patient to reschedule for complex care management services.   Harlene Satterfield  [facility]  Value-Based Care Institute, [facility] Guide  Direct Dial : [phone]  Fax [phone]

## 2023-07-30 ENCOUNTER — Ambulatory Visit: Admitting: Student

## 2023-07-31 ENCOUNTER — Other Ambulatory Visit: Payer: Self-pay | Admitting: Student

## 2023-08-05 ENCOUNTER — Ambulatory Visit: Admitting: Orthopaedic Surgery

## 2023-08-06 ENCOUNTER — Ambulatory Visit: Admitting: Podiatry

## 2023-08-06 ENCOUNTER — Ambulatory Visit

## 2023-08-11 ENCOUNTER — Other Ambulatory Visit: Payer: Self-pay | Admitting: Student

## 2023-08-12 ENCOUNTER — Ambulatory Visit: Admitting: Orthopaedic Surgery

## 2023-08-13 ENCOUNTER — Other Ambulatory Visit: Payer: Self-pay | Admitting: Student

## 2023-08-18 ENCOUNTER — Other Ambulatory Visit: Payer: Self-pay | Admitting: Student

## 2023-08-18 DIAGNOSIS — J302 Other seasonal allergic rhinitis: Secondary | ICD-10-CM

## 2023-08-19 ENCOUNTER — Other Ambulatory Visit: Payer: Self-pay | Admitting: Student

## 2023-08-19 DIAGNOSIS — I5032 Chronic diastolic (congestive) heart failure: Secondary | ICD-10-CM

## 2023-08-28 ENCOUNTER — Encounter: Admitting: Emergency Medicine

## 2023-09-05 ENCOUNTER — Ambulatory Visit (HOSPITAL_BASED_OUTPATIENT_CLINIC_OR_DEPARTMENT_OTHER): Admitting: Cardiology

## 2023-09-08 ENCOUNTER — Ambulatory Visit: Admitting: Orthopedic Surgery

## 2023-09-08 ENCOUNTER — Other Ambulatory Visit: Payer: Self-pay | Admitting: Student

## 2023-09-08 ENCOUNTER — Other Ambulatory Visit (INDEPENDENT_AMBULATORY_CARE_PROVIDER_SITE_OTHER): Payer: Self-pay

## 2023-09-08 DIAGNOSIS — M545 Low back pain, unspecified: Secondary | ICD-10-CM | POA: Diagnosis not present

## 2023-09-08 DIAGNOSIS — Z720 Tobacco use: Secondary | ICD-10-CM

## 2023-09-08 DIAGNOSIS — G8929 Other chronic pain: Secondary | ICD-10-CM

## 2023-09-08 NOTE — Progress Notes (Signed)
 Orthopedic Spine Surgery Office Note  Assessment: Patient is a 58 y.o. female with history of L1-S1 spinal fusion with significant back pain.  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  No radicular symptoms or myelopathic symptoms   Plan: - Recommended a CT scan of the thoracic spine to evaluate for proximal junctional kyphosis and pseudoarthrosis of her prior fusion.  She has no symptoms of radiculopathy or myelopathy, so do not need an MRI at this time -Did recommend a bone density study given her smoking history and development of PJK.  Would want to treat any underlying osteoporosis prior to further surgery -Discussed nicotine  cessation with her.  Would not consider any revision elective surgery unless she is completely nicotine  free -Patient has tried tylenol , ibuprofen, hydrocodone  -Will get A1c, CRP, ESR at our next visit. If she is nicotine  free, I will also order a nicotine  test -Patient should return to office in 6 weeks, x-rays at next visit: supine lateral lumbar   I spent 5 minutes in the room discussing the health benefits of nicotine  cessation.  I covered some of the risks that are specific to surgery that are elevated in people actively using nicotine  including wound healing issues, infection, pseudoarthrosis.  After this discussion, patient said she would quit all nicotine  containing products.  ___________________________________________________________________________   History:  Patient is a 58 y.o. female who presents today for lumbar spine.  Patient has had multiple prior lumbar surgeries.  Her most recent was in 2023 with Dr. Louis.  She had an L1/2 XLIF and PSIF with him.  She said she was mostly having low back pain prior to that surgery.  Since that surgery, she has noticed significantly worsening pain.  She said it has gotten worse with time but even postoperatively the pain was severe.  She has no pain radiating into either lower extremity.  She has not found relief  with any of the treatments tried so far.  She said she went back to Dr. Louis.  She reports that he said there was nothing he could see wrong with her back at that time.   Weakness: Denies Symptoms of imbalance: Denies Paresthesias and numbness: Yes, has a history of neuropathy that is chronic.  No recent changes in numbness or paresthesias Bowel or bladder incontinence: Yes, has a history of urinary incontinence and sees urology.  She says she is due to get a Botox  injection for this issue.  No recent changes in bowel or bladder habits Saddle anesthesia: Denies  Treatments tried: tylenol , ibuprofen, hydrocodone   Review of systems: Denies fevers and chills, night sweats, unexplained weight loss, history of cancer.  Has had pain that wakes her at night  Past medical history: History of alcoholism CHF Bipolar disorder CKD DM2 GAD Dyspnea HLD HTN LBBB Hepatitis B GERD OSA Urge incontinence  Allergies: aspirin , levaquin, methadone  Past surgical history:  Multiple lumbar surgeries -currently has a L2-S1 fusion 3 cervical spine surgeries Anal fistulectomy Trigger finger release Cataract surgery Carpal tunnel release Abscess I&D Right knee arthroscopy with medial meniscectomy Gastric sleeve Polypectomy  Social history: Reports use of nicotine  product (smoking, vaping, patches, smokeless) Alcohol use: Denies Denies recreational drug use   Physical Exam:  BMI of 32.5  General: no acute distress, appears stated age Neurologic: alert, answering questions appropriately, following commands Respiratory: unlabored breathing on room air, symmetric chest rise Psychiatric: appropriate affect, normal cadence to speech   MSK (spine):  -Strength exam      Left  Right EHL  5/5  5/5 TA    5/5  5/5 GSC    5/5  5/5 Knee extension  5/5  5/5 Hip flexion   5/5  5/5  -Sensory exam    Sensation intact to light touch in L3-S1 nerve distributions of bilateral lower  extremities  Imaging: XRs of the lumbar spine from 09/08/2023 were independently reviewed and interpreted, showing disc height loss and kyphosis above her prior L1-S1 fusion.  There are interbody devices at all former disc spaces from L1-S1.  Interbody's appear in appropriate position.  There is subtle lucency seen around the L1 screws. Patient's Glattes angle has changed from 3 degrees to 28 degrees over the course of a year.  Disc height loss at T12/L1.  No evidence of instability in flexion/extension views.   Patient name: [name] Patient MRN: [id number] Date of visit: 09/08/23    I spent 30 minutes in the room going over the patient's history since she has had multiple lumbar spine surgeries.  We also discussed her imaging.  We spent some time going over why certain tests need to be order like the CT scan and the DEXA scan.  I also discussed what could be done if she is able to be nicotine  free.  Told her it would involve a extension of her fusion.

## 2023-09-09 ENCOUNTER — Ambulatory Visit (INDEPENDENT_AMBULATORY_CARE_PROVIDER_SITE_OTHER): Admitting: Family Medicine

## 2023-09-09 ENCOUNTER — Other Ambulatory Visit (HOSPITAL_COMMUNITY)
Admission: RE | Admit: 2023-09-09 | Discharge: 2023-09-09 | Disposition: A | Discharge status: Home / Self Care | Source: Ambulatory Visit | Attending: Family Medicine | Admitting: Family Medicine

## 2023-09-09 VITALS — BP 132/93 | HR 89 | Ht 69.0 in | Wt 219.6 lb

## 2023-09-09 DIAGNOSIS — R911 Solitary pulmonary nodule: Secondary | ICD-10-CM | POA: Diagnosis not present

## 2023-09-09 DIAGNOSIS — Z794 Long term (current) use of insulin: Secondary | ICD-10-CM

## 2023-09-09 DIAGNOSIS — Z1151 Encounter for screening for human papillomavirus (HPV): Secondary | ICD-10-CM | POA: Insufficient documentation

## 2023-09-09 DIAGNOSIS — Z124 Encounter for screening for malignant neoplasm of cervix: Secondary | ICD-10-CM | POA: Diagnosis not present

## 2023-09-09 DIAGNOSIS — N1831 Chronic kidney disease, stage 3a: Secondary | ICD-10-CM

## 2023-09-09 DIAGNOSIS — E1122 Type 2 diabetes mellitus with diabetic chronic kidney disease: Secondary | ICD-10-CM | POA: Diagnosis not present

## 2023-09-09 DIAGNOSIS — Z113 Encounter for screening for infections with a predominantly sexual mode of transmission: Secondary | ICD-10-CM | POA: Insufficient documentation

## 2023-09-09 DIAGNOSIS — R87610 Atypical squamous cells of undetermined significance on cytologic smear of cervix (ASC-US): Secondary | ICD-10-CM | POA: Insufficient documentation

## 2023-09-09 DIAGNOSIS — Z01419 Encounter for gynecological examination (general) (routine) without abnormal findings: Secondary | ICD-10-CM | POA: Diagnosis present

## 2023-09-09 LAB — POCT GLYCOSYLATED HEMOGLOBIN (HGB A1C): HbA1c, POC (controlled diabetic range): 6.8 % (ref 0.0–7.0)

## 2023-09-09 NOTE — Progress Notes (Signed)
'      SUBJECTIVE:   CHIEF COMPLAINT / HPI:   AH is a 58yo F that pf pap smear.  - Previously had ASCUS w/ neg HPV 11/2021.  - Also routine STI screening.  Patient is sexually active, denies any known infectious contacts.  Denies any symptoms, she just wants routine screening.  -Additionally, patient reports that she is still smoking.  She is due for CT lung cancer follow-up for prior lung nodules.  PERTINENT  PMH / PSH: CHF, hypertension, COPD, T2DM, lung nodule, CKD  OBJECTIVE:   BP (!) 132/93   Pulse 89   Ht 5' 9 (1.753 m)   Wt 219 lb 9.6 oz (99.6 kg)   LMP 01/29/1992   SpO2 94%   BMI 32.43 kg/m   General: Alert, pleasant woman. NAD. HEENT: NCAT. MMM. CV: RRR, no murmurs.  Resp: CTAB, no wheezing or crackles. Normal WOB on RA.   Ext: Moves all ext spontaneously Skin: Warm, well perfused  GU: Normal appearance of external cervix, no lesions.  Shelly CMA was present as chaperone  ASSESSMENT/PLAN:   Assessment & Plan Atypical squamous cells of undetermined significance (ASCUS) on Papanicolaou smear of cervix Per [facility] recs, pt is due for cotesting to workup prior ASCUS.  - Pap w/ cotesting - RPR, HIV, GC, Trich Type 2 diabetes mellitus with stage 3a chronic kidney disease, with long-term current use of insulin  (HCC) Will check labs today and f/u in 1-2 weeks. - unable to obtain UACR today, will recheck at future - A1c, lipid panel, BMP Lung nodule Due for 58yr f/u of prior lung nodule.  - Encourage f/u w/ Dr. Koval for smoking cessation  - CT scan ordered  Twyla Nearing, MD [facility]

## 2023-09-09 NOTE — Patient Instructions (Signed)
 Good to see you today - Thank you for coming in  Things we discussed today:  1) We will reach out with any abnormal results from today's tests.   2) Come back to see me in 1-2 weeks so we can discuss your diabetes and lab results.   3) You are due for a CT scan of your lungs to monitor your lung nodule  Please always bring your medication bottles  Come back to see me in 1-2 weeks

## 2023-09-10 ENCOUNTER — Other Ambulatory Visit: Payer: Self-pay | Admitting: Podiatry

## 2023-09-10 ENCOUNTER — Other Ambulatory Visit: Payer: Self-pay | Admitting: Student

## 2023-09-10 ENCOUNTER — Other Ambulatory Visit: Payer: Self-pay | Admitting: Physical Medicine & Rehabilitation

## 2023-09-10 DIAGNOSIS — G894 Chronic pain syndrome: Secondary | ICD-10-CM

## 2023-09-10 DIAGNOSIS — J4489 Other specified chronic obstructive pulmonary disease: Secondary | ICD-10-CM

## 2023-09-10 DIAGNOSIS — E1142 Type 2 diabetes mellitus with diabetic polyneuropathy: Secondary | ICD-10-CM

## 2023-09-10 LAB — BASIC METABOLIC PANEL WITH GFR
BUN/Creatinine Ratio: 12 (ref 12–28)
BUN: 14 mg/dL (ref 8–27)
CO2: 20 mmol/L (ref 20–29)
Calcium: 10 mg/dL (ref 8.7–10.3)
Chloride: 105 mmol/L (ref 96–106)
Creatinine, Ser: 1.14 mg/dL — ABNORMAL HIGH (ref 0.57–1.00)
Glucose: 122 mg/dL — ABNORMAL HIGH (ref 70–99)
Potassium: 4.9 mmol/L (ref 3.5–5.2)
Sodium: 142 mmol/L (ref 134–144)
eGFR: 55 mL/min/1.73 — ABNORMAL LOW (ref >59)

## 2023-09-10 LAB — LIPID PANEL
Chol/HDL Ratio: 2.7 ratio (ref 0.0–4.4)
Cholesterol, Total: 159 mg/dL (ref 100–199)
HDL: 58 mg/dL (ref >39)
LDL Chol Calc (NIH): 73 mg/dL (ref 0–99)
Triglycerides: 166 mg/dL — ABNORMAL HIGH (ref 0–149)
VLDL Cholesterol Cal: 28 mg/dL (ref 5–40)

## 2023-09-10 LAB — HIV ANTIBODY (ROUTINE TESTING W REFLEX): HIV Screen 4th Generation wRfx: NONREACTIVE

## 2023-09-10 LAB — RPR: RPR Ser Ql: NONREACTIVE

## 2023-09-11 NOTE — Assessment & Plan Note (Signed)
 Due for 45yr f/u of prior lung nodule.  - Encourage f/u w/ Dr. Koval for smoking cessation  - CT scan ordered

## 2023-09-11 NOTE — Assessment & Plan Note (Signed)
 Will check labs today and f/u in 1-2 weeks. - unable to obtain UACR today, will recheck at future - A1c, lipid panel, BMP

## 2023-09-12 ENCOUNTER — Encounter: Payer: Self-pay | Admitting: Family Medicine

## 2023-09-12 LAB — CYTOLOGY - PAP
Chlamydia: NEGATIVE
Comment: NEGATIVE
Comment: NEGATIVE
Comment: NEGATIVE
Comment: NORMAL
Diagnosis: UNDETERMINED — AB
High risk HPV: NEGATIVE
Neisseria Gonorrhea: NEGATIVE
Trichomonas: NEGATIVE

## 2023-09-15 ENCOUNTER — Other Ambulatory Visit: Payer: Self-pay | Admitting: Family Medicine

## 2023-09-15 DIAGNOSIS — R911 Solitary pulmonary nodule: Secondary | ICD-10-CM

## 2023-09-16 ENCOUNTER — Ambulatory Visit: Admitting: Pharmacist

## 2023-09-16 ENCOUNTER — Encounter: Payer: Self-pay | Admitting: Family Medicine

## 2023-09-16 DIAGNOSIS — E1122 Type 2 diabetes mellitus with diabetic chronic kidney disease: Secondary | ICD-10-CM

## 2023-09-17 ENCOUNTER — Ambulatory Visit: Admitting: Podiatry

## 2023-09-18 ENCOUNTER — Ambulatory Visit: Payer: Self-pay | Admitting: Family Medicine

## 2023-09-18 NOTE — Telephone Encounter (Signed)
 Attempted to call about pap results. The results are the same as her prior pap (ASCUS), so pt will need colposcopy with our colpo clinic. Will send Mychart message to update pt as well.

## 2023-09-19 ENCOUNTER — Ambulatory Visit

## 2023-09-19 ENCOUNTER — Other Ambulatory Visit: Payer: Self-pay | Admitting: Student

## 2023-09-24 ENCOUNTER — Ambulatory Visit (HOSPITAL_BASED_OUTPATIENT_CLINIC_OR_DEPARTMENT_OTHER)
Admission: RE | Admit: 2023-09-24 | Discharge: 2023-09-24 | Disposition: A | Discharge status: Home / Self Care | Source: Ambulatory Visit | Attending: Orthopedic Surgery | Admitting: Orthopedic Surgery

## 2023-09-24 ENCOUNTER — Ambulatory Visit (HOSPITAL_BASED_OUTPATIENT_CLINIC_OR_DEPARTMENT_OTHER)
Admission: RE | Admit: 2023-09-24 | Discharge: 2023-09-24 | Disposition: A | Discharge status: Home / Self Care | Source: Ambulatory Visit | Attending: Family Medicine | Admitting: Family Medicine

## 2023-09-24 DIAGNOSIS — M545 Low back pain, unspecified: Secondary | ICD-10-CM | POA: Insufficient documentation

## 2023-09-24 DIAGNOSIS — R911 Solitary pulmonary nodule: Secondary | ICD-10-CM | POA: Diagnosis not present

## 2023-09-24 DIAGNOSIS — G8929 Other chronic pain: Secondary | ICD-10-CM

## 2023-09-24 DIAGNOSIS — D3501 Benign neoplasm of right adrenal gland: Secondary | ICD-10-CM | POA: Diagnosis not present

## 2023-09-24 DIAGNOSIS — J432 Centrilobular emphysema: Secondary | ICD-10-CM | POA: Diagnosis not present

## 2023-09-24 MED ORDER — IOHEXOL 300 MG/ML  SOLN
75.0000 mL | Freq: Once | INTRAMUSCULAR | Status: AC | PRN
  Administered 2023-09-24: 75 mL via INTRAVENOUS

## 2023-09-30 ENCOUNTER — Ambulatory Visit: Admitting: Orthopaedic Surgery

## 2023-10-01 ENCOUNTER — Other Ambulatory Visit (HOSPITAL_BASED_OUTPATIENT_CLINIC_OR_DEPARTMENT_OTHER)

## 2023-10-02 ENCOUNTER — Ambulatory Visit

## 2023-10-03 ENCOUNTER — Ambulatory Visit: Payer: Self-pay | Admitting: Family Medicine

## 2023-10-03 ENCOUNTER — Other Ambulatory Visit: Payer: Self-pay | Admitting: Family Medicine

## 2023-10-03 NOTE — Telephone Encounter (Signed)
 Called pt to update her on CT lung results. 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 Pt reports that she has a pulmonolgy appointment on 10/8 w/ Dr. Dante. Advised pt to call her Pulmonologist and let them know of her CT lung results, they may move her appointment sooner. Pt agreeable with this plan.

## 2023-10-07 ENCOUNTER — Inpatient Hospital Stay (HOSPITAL_BASED_OUTPATIENT_CLINIC_OR_DEPARTMENT_OTHER): Admission: RE | Admit: 2023-10-07 | Source: Ambulatory Visit

## 2023-10-07 ENCOUNTER — Ambulatory Visit: Admitting: Pharmacist

## 2023-10-08 ENCOUNTER — Ambulatory Visit: Admitting: Orthopaedic Surgery

## 2023-10-09 ENCOUNTER — Ambulatory Visit

## 2023-10-14 ENCOUNTER — Ambulatory Visit

## 2023-10-14 ENCOUNTER — Other Ambulatory Visit: Payer: Self-pay | Admitting: Student

## 2023-10-14 VITALS — BP 161/87 | HR 94 | Temp 97.5°F | Ht 69.0 in | Wt 228.0 lb

## 2023-10-14 DIAGNOSIS — F17210 Nicotine dependence, cigarettes, uncomplicated: Secondary | ICD-10-CM

## 2023-10-14 DIAGNOSIS — J4489 Other specified chronic obstructive pulmonary disease: Secondary | ICD-10-CM | POA: Diagnosis not present

## 2023-10-14 DIAGNOSIS — J439 Emphysema, unspecified: Secondary | ICD-10-CM

## 2023-10-14 DIAGNOSIS — R911 Solitary pulmonary nodule: Secondary | ICD-10-CM

## 2023-10-14 NOTE — Patient Instructions (Addendum)
 It was nice meeting you in the clinic today.   Will plan to schedule your lung biopsy and give you a phone call when we have an appointment date and time.  We will obtain pulmonary function tests to assess your lung function.  Continue using your Trelegy inhaler and your albuterol .  Please keep your phone with you so that we will give you a phone call you will be able to answer.

## 2023-10-14 NOTE — Assessment & Plan Note (Signed)
 Enlarging right upper lobe lung nodule concerning for an indolent malignant process.  Plan for biopsy.  Discussed with patient options including percutaneous biopsy versus robotic assisted navigational bronchoscopy.  Patient is not willing to do a biopsy under local anesthesia and prefers general anesthesia discussed risks and benefits of each procedure.  Will perform further planning and scheduling of procedure and communicate about date and time of procedure with patient

## 2023-10-14 NOTE — Progress Notes (Signed)
 Subjective:   PATIENT ID: [name] GENDER: female DOB: 11/07/1965, MRN: [id number]   HPI 58 year old female with a past medical history of OHS/OSA on BiPAP, diastolic congestive heart failure, COPD on Trelegy (PFTs in 2022 with FVC 65% predicted, FEV1 57% predicted, DLCO 49% predicted, active tobacco use (60-pack-year smoking history), right upper lobe lung nodule that has increased in size on her most recent CT scan performed on 09/26/2023.  She is here to follow-up regarding her COPD and her lung nodule.  She was recently admitted to the ICU in August due to altered mental status suspected to be in the setting of hypertensive encephalopathy and polypharmacy and she was intubated.  Subsequently she recovered and was successfully extubated and discharged.  She had underwent a gastric sleeve and since then has had a significant drop in her BMI.   Patient reports stable pulmonary symptoms including dyspnea on exertion and cough.  She uses her Trelegy every day and rinses her mouth afterwards.  She rarely uses her albuterol .  She is not on oxygen  anymore.  She states that she has not needed prednisone this year.    Past Medical History:  Diagnosis Date   Alcoholism in remission (HCC)    11-26-2021  pt stated no alcohol few yrs ago   Asthma    Bipolar disorder (HCC)    Chronic diastolic CHF (congestive heart failure) (HCC) 2014   followed by dr b. lonni;   Chronic hypoxemic respiratory failure [facility])    pulmonologist--- dr brenna;   (11-26-2021 per pt was on supplemental oxygen  until she 01/ 2022 did not need it,  does not check O2 sats at home anymore.   CKD (chronic kidney disease), stage III [facility])    nephrologist--- dr dolan   COPD (chronic obstructive pulmonary disease) (HCC)    11-26-2021 pt trying to quit smoking ,  started age 58   Depression    Diabetes mellitus type 2, diet-controlled (HCC)    followed by pcp   (11-26-2021  per pt last A1c by pcp on 10-30-2021  7.8,  stated does not check blood sugar)   Dyspnea    Fibrocystic breast disease    GAD (generalized anxiety disorder)    GERD (gastroesophageal reflux disease)    Hepatitis B surface antigen positive 11/2017   pt stated asymptomatic ,  no treatment   History of cervical dysplasia 04/10/2011   '93- once dx.-got pregnant-no intervention, then postpartum, no dysplasia found   History of condyloma acuminatum 04/09/2011   Hyperlipidemia    Hypertension    Incomplete left bundle branch block (LBBB)    Lung nodule 11/2017   pulmonologist--- dr brenna;  RUL and RLL   Muscle spasm of both lower legs    chronic , due to back issues,   OA (osteoarthritis) 04/10/2011   hips, shoulders, back   OSA (obstructive sleep apnea) 2009   study on epic 09-18-2007 mild osa w/ hypoxmia ;   (11-26-2021 per pt last used cpap 01/ 2022 before bariatric surgery 04/ 2022 stated lost wt / and stopped needing supplemental oxygen  , did not need anymore)   Polyneuropathy    due to back issues and DM   Polysubstance abuse (HCC)    11-26-2021  pt hx cocaine use stated none for yrs but ED visit 08-14-2021  positive UDS,  pt stated it was once only for depression none since   PSVT (paroxysmal supraventricular tachycardia) (HCC)    followed by cardiology   Spinal  stenosis, unspecified spinal region    Urge incontinence of urine    Wears dentures    upper     Family History  Problem Relation Age of Onset   Pulmonary embolism Mother    Stroke Father    Alcohol abuse Father    Pneumonia Father    Hypertension Father    Breast cancer Maternal Aunt    Hypertension Other    Diabetes Other        Aunts and cousins   Asthma Sister    Depression Sister    Arthritis Sister    Sickle cell anemia Son      Social History   Socioeconomic History   Marital status: Divorced    Spouse name: Not on file   Number of children: 1   Years of education: 12   Highest education level: 12th grade  Occupational History    Occupation: disabled    Comment: Disabled  Tobacco Use   Smoking status: Every Day    Current packs/day: 0.50    Average packs/day: 0.5 packs/day for 46.7 years (23.4 ttl pk-yrs)    Types: Cigarettes    Start date: 01/28/1977    Passive exposure: Past   Smokeless tobacco: Never  Vaping Use   Vaping status: Never Used  Substance and Sexual Activity   Alcohol use: Not Currently    Comment: hx alcoholism,  (11-26-2021 per pt no alcohol in a while)   Drug use: Not Currently    Comment: 11-26-2021  (hx cocaine use) positive UDS in epic 08-14-2021  pt stated only did it once due to depression 07/ 2023 none since;  per pt last marijuana yrs ago   Sexual activity: Not Currently    Partners: Male    Birth control/protection: Post-menopausal  Other Topics Concern   Not on file  Social History Narrative   Current Social History       Who lives at home: Patient lives alone in one level home; has 4 steps onto porch with a handrail. Has smoke alarms, no throw rugs. Has elevated toilet. Has shower bench in tub with grab rails.    Transportation: Patient has own vehicle and drives herself    Important Relationships Family and friends    Pets: None    Likes to eat varied diet. Seafood, fish, roasted [place] breast, vegetables and salads and fruits.   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    Current Stressors: None    Religious / Personal Beliefs: God Jesus    Other: I love everyone, I wake up with joy in my heart and go to sleep the same way. I'm a lovable person.                                                                                                   Social Drivers of Health   Financial Resource Strain: Low Risk  (07/14/2023)   Overall Financial Resource Strain (CARDIA)  Difficulty of Paying Living Expenses: Not hard at all  Food Insecurity: No Food Insecurity (07/14/2023)   Hunger  Vital Sign    Worried About Running Out of Food in the Last Year: Never true    Ran Out of Food in the Last Year: Never true  Transportation Needs: No Transportation Needs (07/14/2023)   PRAPARE - Administrator, Civil Service (Medical): No    Lack of Transportation (Non-Medical): No  Physical Activity: Inactive (07/14/2023)   Exercise Vital Sign    Days of Exercise per Week: 0 days    Minutes of Exercise per Session: Not on file  Stress: Stress Concern Present (07/14/2023)   [employer] of Occupational Health - Occupational Stress Questionnaire    Feeling of Stress: Very much  Social Connections: Socially Isolated (07/14/2023)   Social Connection and Isolation Panel    Frequency of Communication with Friends and Family: More than three times a week    Frequency of Social Gatherings with Friends and Family: Never    Attends Religious Services: Never    Database administrator or Organizations: No    Attends Engineer, structural: Not on file    Marital Status: Divorced  Intimate Partner Violence: Not At Risk (06/02/2023)   Humiliation, Afraid, Rape, and Kick questionnaire    Fear of Current or Ex-Partner: No    Emotionally Abused: No    Physically Abused: No    Sexually Abused: No     Allergies  Allergen Reactions   Asa [Aspirin ] Other (See Comments)    Kidney disease   Methadose [Methadone Hcl] Other (See Comments)    Syncopal event   Levaquin [Levofloxacin] Itching     Outpatient Medications Prior to Visit  Medication Sig Dispense Refill   acetaminophen  (TYLENOL ) 500 MG tablet Take 1,000 mg by mouth 2 (two) times daily.     albuterol  (VENTOLIN  HFA) 108 (90 Base) MCG/ACT inhaler INHALE TWO PUFFS INTO THE LUNGS EVERY SIX HOURS AS NEEDED WHEEZING OR For SHORTNESS OF BREATH 13.4 g 0   ammonium lactate  (LAC-HYDRIN ) 12 % lotion For dry skin 400 g 0   atorvastatin  (LIPITOR) 40 MG tablet Take 1 tablet (40 mg total) by mouth daily. 180 tablet 0   baclofen   (LIORESAL ) 10 MG tablet Take 1 tablet (10 mg total) by mouth 3 (three) times daily as needed for muscle spasms. 90 tablet 0   busPIRone  (BUSPAR ) 10 MG tablet Take 1 tablet (10 mg total) by mouth 3 (three) times daily. 540 tablet 0   carvedilol  (COREG ) 12.5 MG tablet TAKE ONE TABLET (12.5MG  TOTAL) BY MOUTH TWICE DAILY 60 tablet 2   cloNIDine  (CATAPRES ) 0.2 MG tablet Take 1 tablet (0.2 mg total) by mouth 2 (two) times daily. 60 tablet 11   diclofenac  Sodium (VOLTAREN ) 1 % GEL APPLY FOUR grams topically FOUR TIMES DAILY 100 g 3   DILT-XR 120 MG 24 hr capsule TAKE ONE CAPSULE BY MOUTH TWICE DAILY 60 capsule 2   empagliflozin  (JARDIANCE ) 10 MG TABS tablet TAKE ONE TABLET BY MOUTH EVERY DAY related TO type2 diabetes 30 tablet 11   fluticasone  (FLONASE ) 50 MCG/ACT nasal spray instill TWO SPRAYS IN EACH NOSTRIL TWICE DAILY 16 g 2   furosemide  (LASIX ) 80 MG tablet TAKE ONE TABLET BY MOUTH TWICE DAILY related TO systolic (congestive heart failure) 60 tablet 0   gabapentin  (NEURONTIN ) 300 MG capsule Take 1 capsule (300 mg total) by mouth 3 (three) times daily. 90 capsule 5  ketoconazole  (NIZORAL ) 2 % cream APPLY TO THE AFFECTED AREA(S) EVERY DAY 15 g 0   loratadine  (CLARITIN ) 10 MG tablet TAKE ONE TABLET BY MOUTH EVERY DAY FOR allergies 30 tablet 0   losartan  (COZAAR ) 100 MG tablet Take 1 tablet (100 mg total) by mouth daily. 30 tablet 0   montelukast  (SINGULAIR ) 10 MG tablet TAKE ONE TABLET BY MOUTH AT BEDTIME 30 tablet 2   Multiple Vitamins-Minerals (MULTIVITAMIN ADULTS 50+) TABS Take 1 tablet by mouth daily.     NARCAN  4 MG/0.1ML LIQD nasal spray kit Place 0.4 mg into the nose once as needed (accidental overdose).     nystatin  (MYCOSTATIN ) 100000 UNIT/ML suspension Take 5 mLs (500,000 Units total) by mouth 4 (four) times daily. 60 mL 0   omeprazole  (PRILOSEC) 20 MG capsule Take 1 capsule (20 mg total) by mouth daily. 90 capsule 3   potassium chloride  SA (KLOR-CON  M) 20 MEQ tablet TAKE ONE TABLET (  TOTAL) BY MOUTH EVERY DAY 30 tablet 2   spironolactone  (ALDACTONE ) 25 MG tablet Take 0.5 tablets (12.5 mg total) by mouth daily. 15 tablet 3   TRELEGY ELLIPTA  100-62.5-25 MCG/ACT AEPB INHALE ONE PUFF INTO THE LUNGS EVERY DAY RELATED TO ACUTE AND CHRONIC RESPIRATOPR FAILURE 60 each 3   venlafaxine  XR (EFFEXOR -XR) 75 MG 24 hr capsule Take 1 capsule (75 mg total) by mouth daily with breakfast. 30 capsule 6   Buprenorphine  HCl (BELBUCA ) 900 MCG FILM Place 900 mcg inside cheek 2 (two) times daily.     lidocaine  (LIDODERM ) 5 % Place 1 patch onto the skin daily. Remove & Discard patch within 12 hours or as directed by MD 30 patch 0   losartan  (COZAAR ) 25 MG tablet TAKE ONE TABLET BY MOUTH EVERY DAY 30 tablet 11   No facility-administered medications prior to visit.    ROS Reviewed all systems and reported negative except as above     Objective:   Vitals:   10/14/23 1100  BP: (!) 161/87  Pulse: 94  Temp: (!) 97.5 F (36.4 C)  TempSrc: Temporal  SpO2: 95%  Weight: 228 lb (103.4 kg)  Height: 5' 9 (1.753 m)    Physical Exam     CBC    Component Value Date/Time   WBC 11.2 (H) 09/11/2022 0601   RBC 6.39 (H) 09/11/2022 0601   HGB 16.5 (H) 09/11/2022 0601   HGB 18.7 (H) 01/11/2020 1625   HCT 48.7 (H) 09/11/2022 0601   HCT 54.7 (H) 01/11/2020 1625   PLT 201 09/11/2022 0601   PLT 311 01/11/2020 1625   MCV 76.2 (L) 09/11/2022 0601   MCV 83 01/11/2020 1625   MCH 25.8 (L) 09/11/2022 0601   MCHC 33.9 09/11/2022 0601   RDW 21.5 (H) 09/11/2022 0601   RDW 15.6 (H) 01/11/2020 1625   LYMPHSABS 2.2 09/11/2022 0601   MONOABS 1.0 09/11/2022 0601   EOSABS 0.1 09/11/2022 0601   BASOSABS 0.2 (H) 09/11/2022 0601     Chest imaging: I reviewed her CT chest performed on 09/26/2023.  Her right upper lobe nodule now measures 1.7 x 1.3 cm enlarged from the prior scan where it was 1 x 0.9 cm.  PFT:    Latest Ref Rng & Units 08/18/2018    2:59 PM  PFT Results  FVC-Pre L 2.32   FVC-Predicted  Pre % 66   FVC-Post L 2.29   FVC-Predicted Post % 65   Pre FEV1/FVC % % 68   Post FEV1/FCV % % 69   FEV1-Pre  L 1.57   FEV1-Predicted Pre % 56   FEV1-Post L 1.58   DLCO uncorrected ml/min/mmHg 12.30   DLCO UNC% % 49   DLVA Predicted % 88   TLC L 3.41   TLC % Predicted % 57   RV % Predicted % 51        Assessment & Plan:   Assessment & Plan Lung nodule Enlarging right upper lobe lung nodule concerning for an indolent malignant process.  Plan for biopsy.  Discussed with patient options including percutaneous biopsy versus robotic assisted navigational bronchoscopy.  Patient is not willing to do a biopsy under local anesthesia and prefers general anesthesia discussed risks and benefits of each procedure.  Will perform further planning and scheduling of procedure and communicate about date and time of procedure with patient COPD with chronic bronchitis and emphysema (HCC) Patient continues on Trelegy and albuterol .  Seems to have stable symptoms.  No recent exacerbations.  Does not use her rescue albuterol  often.  Plan to obtain repeat PFTs.  Advised to stop smoking cigarettes.  She states that she has already tried multiple interventions to quit smoking but continues to smoke.  She said that she will try to quit smoking again on her own.  No orders of the defined types were placed in this encounter.     Zola Herter, MD [facility] Office: [phone]

## 2023-10-14 NOTE — H&P (View-Only) (Signed)
 Subjective:   PATIENT ID: [name] GENDER: female DOB: 11/07/1965, MRN: [id number]   HPI 58 year old female with a past medical history of OHS/OSA on BiPAP, diastolic congestive heart failure, COPD on Trelegy (PFTs in 2022 with FVC 65% predicted, FEV1 57% predicted, DLCO 49% predicted, active tobacco use (60-pack-year smoking history), right upper lobe lung nodule that has increased in size on her most recent CT scan performed on 09/26/2023.  She is here to follow-up regarding her COPD and her lung nodule.  She was recently admitted to the ICU in August due to altered mental status suspected to be in the setting of hypertensive encephalopathy and polypharmacy and she was intubated.  Subsequently she recovered and was successfully extubated and discharged.  She had underwent a gastric sleeve and since then has had a significant drop in her BMI.   Patient reports stable pulmonary symptoms including dyspnea on exertion and cough.  She uses her Trelegy every day and rinses her mouth afterwards.  She rarely uses her albuterol .  She is not on oxygen  anymore.  She states that she has not needed prednisone this year.    Past Medical History:  Diagnosis Date   Alcoholism in remission (HCC)    11-26-2021  pt stated no alcohol few yrs ago   Asthma    Bipolar disorder (HCC)    Chronic diastolic CHF (congestive heart failure) (HCC) 2014   followed by dr b. lonni;   Chronic hypoxemic respiratory failure [facility])    pulmonologist--- dr brenna;   (11-26-2021 per pt was on supplemental oxygen  until she 01/ 2022 did not need it,  does not check O2 sats at home anymore.   CKD (chronic kidney disease), stage III [facility])    nephrologist--- dr dolan   COPD (chronic obstructive pulmonary disease) (HCC)    11-26-2021 pt trying to quit smoking ,  started age 58   Depression    Diabetes mellitus type 2, diet-controlled (HCC)    followed by pcp   (11-26-2021  per pt last A1c by pcp on 10-30-2021  7.8,  stated does not check blood sugar)   Dyspnea    Fibrocystic breast disease    GAD (generalized anxiety disorder)    GERD (gastroesophageal reflux disease)    Hepatitis B surface antigen positive 11/2017   pt stated asymptomatic ,  no treatment   History of cervical dysplasia 04/10/2011   '93- once dx.-got pregnant-no intervention, then postpartum, no dysplasia found   History of condyloma acuminatum 04/09/2011   Hyperlipidemia    Hypertension    Incomplete left bundle branch block (LBBB)    Lung nodule 11/2017   pulmonologist--- dr brenna;  RUL and RLL   Muscle spasm of both lower legs    chronic , due to back issues,   OA (osteoarthritis) 04/10/2011   hips, shoulders, back   OSA (obstructive sleep apnea) 2009   study on epic 09-18-2007 mild osa w/ hypoxmia ;   (11-26-2021 per pt last used cpap 01/ 2022 before bariatric surgery 04/ 2022 stated lost wt / and stopped needing supplemental oxygen  , did not need anymore)   Polyneuropathy    due to back issues and DM   Polysubstance abuse (HCC)    11-26-2021  pt hx cocaine use stated none for yrs but ED visit 08-14-2021  positive UDS,  pt stated it was once only for depression none since   PSVT (paroxysmal supraventricular tachycardia) (HCC)    followed by cardiology   Spinal  stenosis, unspecified spinal region    Urge incontinence of urine    Wears dentures    upper     Family History  Problem Relation Age of Onset   Pulmonary embolism Mother    Stroke Father    Alcohol abuse Father    Pneumonia Father    Hypertension Father    Breast cancer Maternal Aunt    Hypertension Other    Diabetes Other        Aunts and cousins   Asthma Sister    Depression Sister    Arthritis Sister    Sickle cell anemia Son      Social History   Socioeconomic History   Marital status: Divorced    Spouse name: Not on file   Number of children: 1   Years of education: 12   Highest education level: 12th grade  Occupational History    Occupation: disabled    Comment: Disabled  Tobacco Use   Smoking status: Every Day    Current packs/day: 0.50    Average packs/day: 0.5 packs/day for 46.7 years (23.4 ttl pk-yrs)    Types: Cigarettes    Start date: 01/28/1977    Passive exposure: Past   Smokeless tobacco: Never  Vaping Use   Vaping status: Never Used  Substance and Sexual Activity   Alcohol use: Not Currently    Comment: hx alcoholism,  (11-26-2021 per pt no alcohol in a while)   Drug use: Not Currently    Comment: 11-26-2021  (hx cocaine use) positive UDS in epic 08-14-2021  pt stated only did it once due to depression 07/ 2023 none since;  per pt last marijuana yrs ago   Sexual activity: Not Currently    Partners: Male    Birth control/protection: Post-menopausal  Other Topics Concern   Not on file  Social History Narrative   Current Social History       Who lives at home: Patient lives alone in one level home; has 4 steps onto porch with a handrail. Has smoke alarms, no throw rugs. Has elevated toilet. Has shower bench in tub with grab rails.    Transportation: Patient has own vehicle and drives herself    Important Relationships Family and friends    Pets: None    Likes to eat varied diet. Seafood, fish, roasted [place] breast, vegetables and salads and fruits.   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    Current Stressors: None    Religious / Personal Beliefs: God Jesus    Other: I love everyone, I wake up with joy in my heart and go to sleep the same way. I'm a lovable person.                                                                                                   Social Drivers of Health   Financial Resource Strain: Low Risk  (07/14/2023)   Overall Financial Resource Strain (CARDIA)  Difficulty of Paying Living Expenses: Not hard at all  Food Insecurity: No Food Insecurity (07/14/2023)   Hunger  Vital Sign    Worried About Running Out of Food in the Last Year: Never true    Ran Out of Food in the Last Year: Never true  Transportation Needs: No Transportation Needs (07/14/2023)   PRAPARE - Administrator, Civil Service (Medical): No    Lack of Transportation (Non-Medical): No  Physical Activity: Inactive (07/14/2023)   Exercise Vital Sign    Days of Exercise per Week: 0 days    Minutes of Exercise per Session: Not on file  Stress: Stress Concern Present (07/14/2023)   [employer] of Occupational Health - Occupational Stress Questionnaire    Feeling of Stress: Very much  Social Connections: Socially Isolated (07/14/2023)   Social Connection and Isolation Panel    Frequency of Communication with Friends and Family: More than three times a week    Frequency of Social Gatherings with Friends and Family: Never    Attends Religious Services: Never    Database administrator or Organizations: No    Attends Engineer, structural: Not on file    Marital Status: Divorced  Intimate Partner Violence: Not At Risk (06/02/2023)   Humiliation, Afraid, Rape, and Kick questionnaire    Fear of Current or Ex-Partner: No    Emotionally Abused: No    Physically Abused: No    Sexually Abused: No     Allergies  Allergen Reactions   Asa [Aspirin ] Other (See Comments)    Kidney disease   Methadose [Methadone Hcl] Other (See Comments)    Syncopal event   Levaquin [Levofloxacin] Itching     Outpatient Medications Prior to Visit  Medication Sig Dispense Refill   acetaminophen  (TYLENOL ) 500 MG tablet Take 1,000 mg by mouth 2 (two) times daily.     albuterol  (VENTOLIN  HFA) 108 (90 Base) MCG/ACT inhaler INHALE TWO PUFFS INTO THE LUNGS EVERY SIX HOURS AS NEEDED WHEEZING OR For SHORTNESS OF BREATH 13.4 g 0   ammonium lactate  (LAC-HYDRIN ) 12 % lotion For dry skin 400 g 0   atorvastatin  (LIPITOR) 40 MG tablet Take 1 tablet (40 mg total) by mouth daily. 180 tablet 0   baclofen   (LIORESAL ) 10 MG tablet Take 1 tablet (10 mg total) by mouth 3 (three) times daily as needed for muscle spasms. 90 tablet 0   busPIRone  (BUSPAR ) 10 MG tablet Take 1 tablet (10 mg total) by mouth 3 (three) times daily. 540 tablet 0   carvedilol  (COREG ) 12.5 MG tablet TAKE ONE TABLET (12.5MG  TOTAL) BY MOUTH TWICE DAILY 60 tablet 2   cloNIDine  (CATAPRES ) 0.2 MG tablet Take 1 tablet (0.2 mg total) by mouth 2 (two) times daily. 60 tablet 11   diclofenac  Sodium (VOLTAREN ) 1 % GEL APPLY FOUR grams topically FOUR TIMES DAILY 100 g 3   DILT-XR 120 MG 24 hr capsule TAKE ONE CAPSULE BY MOUTH TWICE DAILY 60 capsule 2   empagliflozin  (JARDIANCE ) 10 MG TABS tablet TAKE ONE TABLET BY MOUTH EVERY DAY related TO type2 diabetes 30 tablet 11   fluticasone  (FLONASE ) 50 MCG/ACT nasal spray instill TWO SPRAYS IN EACH NOSTRIL TWICE DAILY 16 g 2   furosemide  (LASIX ) 80 MG tablet TAKE ONE TABLET BY MOUTH TWICE DAILY related TO systolic (congestive heart failure) 60 tablet 0   gabapentin  (NEURONTIN ) 300 MG capsule Take 1 capsule (300 mg total) by mouth 3 (three) times daily. 90 capsule 5  ketoconazole  (NIZORAL ) 2 % cream APPLY TO THE AFFECTED AREA(S) EVERY DAY 15 g 0   loratadine  (CLARITIN ) 10 MG tablet TAKE ONE TABLET BY MOUTH EVERY DAY FOR allergies 30 tablet 0   losartan  (COZAAR ) 100 MG tablet Take 1 tablet (100 mg total) by mouth daily. 30 tablet 0   montelukast  (SINGULAIR ) 10 MG tablet TAKE ONE TABLET BY MOUTH AT BEDTIME 30 tablet 2   Multiple Vitamins-Minerals (MULTIVITAMIN ADULTS 50+) TABS Take 1 tablet by mouth daily.     NARCAN  4 MG/0.1ML LIQD nasal spray kit Place 0.4 mg into the nose once as needed (accidental overdose).     nystatin  (MYCOSTATIN ) 100000 UNIT/ML suspension Take 5 mLs (500,000 Units total) by mouth 4 (four) times daily. 60 mL 0   omeprazole  (PRILOSEC) 20 MG capsule Take 1 capsule (20 mg total) by mouth daily. 90 capsule 3   potassium chloride  SA (KLOR-CON  M) 20 MEQ tablet TAKE ONE TABLET (  TOTAL) BY MOUTH EVERY DAY 30 tablet 2   spironolactone  (ALDACTONE ) 25 MG tablet Take 0.5 tablets (12.5 mg total) by mouth daily. 15 tablet 3   TRELEGY ELLIPTA  100-62.5-25 MCG/ACT AEPB INHALE ONE PUFF INTO THE LUNGS EVERY DAY RELATED TO ACUTE AND CHRONIC RESPIRATOPR FAILURE 60 each 3   venlafaxine  XR (EFFEXOR -XR) 75 MG 24 hr capsule Take 1 capsule (75 mg total) by mouth daily with breakfast. 30 capsule 6   Buprenorphine  HCl (BELBUCA ) 900 MCG FILM Place 900 mcg inside cheek 2 (two) times daily.     lidocaine  (LIDODERM ) 5 % Place 1 patch onto the skin daily. Remove & Discard patch within 12 hours or as directed by MD 30 patch 0   losartan  (COZAAR ) 25 MG tablet TAKE ONE TABLET BY MOUTH EVERY DAY 30 tablet 11   No facility-administered medications prior to visit.    ROS Reviewed all systems and reported negative except as above     Objective:   Vitals:   10/14/23 1100  BP: (!) 161/87  Pulse: 94  Temp: (!) 97.5 F (36.4 C)  TempSrc: Temporal  SpO2: 95%  Weight: 228 lb (103.4 kg)  Height: 5' 9 (1.753 m)    Physical Exam     CBC    Component Value Date/Time   WBC 11.2 (H) 09/11/2022 0601   RBC 6.39 (H) 09/11/2022 0601   HGB 16.5 (H) 09/11/2022 0601   HGB 18.7 (H) 01/11/2020 1625   HCT 48.7 (H) 09/11/2022 0601   HCT 54.7 (H) 01/11/2020 1625   PLT 201 09/11/2022 0601   PLT 311 01/11/2020 1625   MCV 76.2 (L) 09/11/2022 0601   MCV 83 01/11/2020 1625   MCH 25.8 (L) 09/11/2022 0601   MCHC 33.9 09/11/2022 0601   RDW 21.5 (H) 09/11/2022 0601   RDW 15.6 (H) 01/11/2020 1625   LYMPHSABS 2.2 09/11/2022 0601   MONOABS 1.0 09/11/2022 0601   EOSABS 0.1 09/11/2022 0601   BASOSABS 0.2 (H) 09/11/2022 0601     Chest imaging: I reviewed her CT chest performed on 09/26/2023.  Her right upper lobe nodule now measures 1.7 x 1.3 cm enlarged from the prior scan where it was 1 x 0.9 cm.  PFT:    Latest Ref Rng & Units 08/18/2018    2:59 PM  PFT Results  FVC-Pre L 2.32   FVC-Predicted  Pre % 66   FVC-Post L 2.29   FVC-Predicted Post % 65   Pre FEV1/FVC % % 68   Post FEV1/FCV % % 69   FEV1-Pre  L 1.57   FEV1-Predicted Pre % 56   FEV1-Post L 1.58   DLCO uncorrected ml/min/mmHg 12.30   DLCO UNC% % 49   DLVA Predicted % 88   TLC L 3.41   TLC % Predicted % 57   RV % Predicted % 51        Assessment & Plan:   Assessment & Plan Lung nodule Enlarging right upper lobe lung nodule concerning for an indolent malignant process.  Plan for biopsy.  Discussed with patient options including percutaneous biopsy versus robotic assisted navigational bronchoscopy.  Patient is not willing to do a biopsy under local anesthesia and prefers general anesthesia discussed risks and benefits of each procedure.  Will perform further planning and scheduling of procedure and communicate about date and time of procedure with patient COPD with chronic bronchitis and emphysema (HCC) Patient continues on Trelegy and albuterol .  Seems to have stable symptoms.  No recent exacerbations.  Does not use her rescue albuterol  often.  Plan to obtain repeat PFTs.  Advised to stop smoking cigarettes.  She states that she has already tried multiple interventions to quit smoking but continues to smoke.  She said that she will try to quit smoking again on her own.  No orders of the defined types were placed in this encounter.     Zola Herter, MD [facility] Office: [phone]

## 2023-10-15 ENCOUNTER — Telehealth: Payer: Self-pay

## 2023-10-15 ENCOUNTER — Encounter: Payer: Self-pay | Admitting: Family Medicine

## 2023-10-15 ENCOUNTER — Ambulatory Visit: Admitting: Family Medicine

## 2023-10-15 VITALS — BP 148/74 | HR 98 | Wt 227.0 lb

## 2023-10-15 DIAGNOSIS — Z794 Long term (current) use of insulin: Secondary | ICD-10-CM

## 2023-10-15 DIAGNOSIS — R052 Subacute cough: Secondary | ICD-10-CM

## 2023-10-15 DIAGNOSIS — Z23 Encounter for immunization: Secondary | ICD-10-CM | POA: Diagnosis not present

## 2023-10-15 DIAGNOSIS — R87610 Atypical squamous cells of undetermined significance on cytologic smear of cervix (ASC-US): Secondary | ICD-10-CM

## 2023-10-15 DIAGNOSIS — R911 Solitary pulmonary nodule: Secondary | ICD-10-CM | POA: Diagnosis not present

## 2023-10-15 DIAGNOSIS — F17200 Nicotine dependence, unspecified, uncomplicated: Secondary | ICD-10-CM | POA: Diagnosis not present

## 2023-10-15 DIAGNOSIS — I10 Essential (primary) hypertension: Secondary | ICD-10-CM

## 2023-10-15 DIAGNOSIS — N1831 Chronic kidney disease, stage 3a: Secondary | ICD-10-CM

## 2023-10-15 DIAGNOSIS — J309 Allergic rhinitis, unspecified: Secondary | ICD-10-CM

## 2023-10-15 DIAGNOSIS — E1122 Type 2 diabetes mellitus with diabetic chronic kidney disease: Secondary | ICD-10-CM | POA: Diagnosis not present

## 2023-10-15 NOTE — Telephone Encounter (Signed)
 Please schedule the following:  Provider performing procedure: Zola Zaida Domino Assaker  Diagnosis: Lung Nodule Which side if for nodule / mass? Right Upper Lobe Procedure: Robotic Assisted Navigational Bronchscopy  Has patient been spoken to by Provider and given informed consent? Yes Anesthesia: General  Do you need Fluro? Yes Duration of procedure: 90 minutes Date: 10/30/23  Time: AM Location: [facility] endo Does patient have OSA? Yes DM? No Or Latex allergy? No Medication Restriction/ Anticoagulate/Antiplatelet: No Pre-op Labs Ordered:determined by Anesthesia Imaging request: None  (If, SuperDimension CT Chest, please have STAT courier sent to ENDO)

## 2023-10-15 NOTE — Progress Notes (Signed)
    SUBJECTIVE:   CHIEF COMPLAINT / HPI:   AH is a 58yo F that pf f/u  Lung Nodule  Tobacco use - CT scan showed c/f growing lung nodule.  - Saw pulmonologist yesterday, they are planning to do lung nodule biopsy. - She is still smoking - She is open to seeing Dr. Koval to work on smoking cessation  Cough/Post Nasal Drip - Reports her allergies are acting up. She feels a lot of post nasal drip and aggravating her cough.  - She is doing claritin , flonase   HTN - Pt reports that she has been eating a lot of salty food (chili cheese, chips, fried foods) yesterday and this AM, and this has been causing her BP to be elevated - She also thinks her smoking is worsening it - She does not want to change her BP meds at this time. She would rather focus on changing her diet and smoking cessation first.    - She has been cutting back on her snacks (ice cream, cakes) and thinks this could be helping her A1c.      PERTINENT  PMH / PSH: T2DM, HTN, COPD, CHF  OBJECTIVE:   BP (!) 152/78   Pulse 98   Wt 227 lb (103 kg)   LMP 01/29/1992   SpO2 96%   BMI 33.52 kg/m   General: Alert, pleasant woman. NAD. HEENT: NCAT. MMM. CV: RRR, no murmurs.  Resp: CTAB, no wheezing or crackles. Normal WOB on RA.  Abm: Soft, nontender, nondistended. BS present. Ext: Moves all ext spontaneously Skin: Warm, well perfused   ASSESSMENT/PLAN:   Assessment & Plan Type 2 diabetes mellitus with stage 3a chronic kidney disease, with long-term current use of insulin  (HCC) UACR today.  - Cont jardiance . - f/u in 2-3 months for A1c (currently at goal) Primary hypertension Above goal. Pt would prefer to work on diet changes and smoking cessation before adjusting medications.  - cont coreg , clonidine , dilt, losartan , spiro Lung nodule F/u lung biopsy with Pulm - encouraged smoking cessation Tobacco use disorder - Scheduled w/ Dr. Koval to discuss smoking cessation Encounter for immunization  Atypical  squamous cells of undetermined significance on cytologic smear of cervix (ASC-US ) Scheduled in colpo clinic for colposcopy Subacute cough Acute on chronic. Low c/f COPD exac or active infxn given normal WOB, normal lung exam, VSS. Pt is already on claritin  and flonase . Pt would benefit most from smoking cessation at this point. - Cont claritin , flonase  - Encouraged smoiking cessation     Twyla Nearing, MD [facility]

## 2023-10-15 NOTE — Patient Instructions (Signed)
 Good to see you today - Thank you for coming in  Things we discussed today:  1) I have a follow-up appointment with Dr. Koval in 1 week. He can discuss smoking cessation and recheck your blood pressure.   2) You have a follow-up appointment with our colposcopy clinic to follow-up your pap results.  3) We will try to get a urine sample today  4) Follow-up with me in 2-3 months to recheck your A1c

## 2023-10-15 NOTE — Telephone Encounter (Signed)
 Patient is aware routing to [name] for Auth.

## 2023-10-16 LAB — MICROALBUMIN / CREATININE URINE RATIO
Creatinine, Urine: 54.8 mg/dL
Microalb/Creat Ratio: 331 mg/g{creat} — ABNORMAL HIGH (ref 0–29)
Microalbumin, Urine: 181.4 ug/mL

## 2023-10-17 MED ORDER — SALINE SPRAY 0.65 % NA SOLN: 1.0000 | NASAL | 1 refills | Status: AC | PRN

## 2023-10-17 NOTE — Assessment & Plan Note (Signed)
 UACR today.  - Cont jardiance . - f/u in 2-3 months for A1c (currently at goal)

## 2023-10-17 NOTE — Assessment & Plan Note (Signed)
 F/u lung biopsy with Pulm - encouraged smoking cessation

## 2023-10-17 NOTE — Assessment & Plan Note (Signed)
 Above goal. Pt would prefer to work on diet changes and smoking cessation before adjusting medications.  - cont coreg , clonidine , dilt, losartan , spiro

## 2023-10-20 ENCOUNTER — Ambulatory Visit: Admitting: Orthopedic Surgery

## 2023-10-20 ENCOUNTER — Other Ambulatory Visit (HOSPITAL_BASED_OUTPATIENT_CLINIC_OR_DEPARTMENT_OTHER)

## 2023-10-21 ENCOUNTER — Ambulatory Visit: Admitting: Pharmacist

## 2023-10-23 ENCOUNTER — Encounter: Payer: Self-pay | Admitting: Family Medicine

## 2023-10-23 ENCOUNTER — Other Ambulatory Visit: Payer: Self-pay | Admitting: Family Medicine

## 2023-10-24 ENCOUNTER — Ambulatory Visit: Admitting: Pharmacist

## 2023-10-28 ENCOUNTER — Ambulatory Visit (HOSPITAL_BASED_OUTPATIENT_CLINIC_OR_DEPARTMENT_OTHER)
Admission: RE | Admit: 2023-10-28 | Discharge: 2023-10-28 | Disposition: A | Discharge status: Home / Self Care | Source: Ambulatory Visit | Attending: Orthopedic Surgery | Admitting: Orthopedic Surgery

## 2023-10-28 DIAGNOSIS — M545 Low back pain, unspecified: Secondary | ICD-10-CM | POA: Diagnosis not present

## 2023-10-28 DIAGNOSIS — M8589 Other specified disorders of bone density and structure, multiple sites: Secondary | ICD-10-CM | POA: Diagnosis not present

## 2023-10-28 DIAGNOSIS — M8580 Other specified disorders of bone density and structure, unspecified site: Secondary | ICD-10-CM | POA: Diagnosis not present

## 2023-10-28 DIAGNOSIS — Z13820 Encounter for screening for osteoporosis: Secondary | ICD-10-CM | POA: Diagnosis not present

## 2023-10-28 DIAGNOSIS — G8929 Other chronic pain: Secondary | ICD-10-CM | POA: Diagnosis not present

## 2023-10-28 DIAGNOSIS — Z780 Asymptomatic menopausal state: Secondary | ICD-10-CM | POA: Diagnosis not present

## 2023-10-29 ENCOUNTER — Other Ambulatory Visit: Payer: Self-pay

## 2023-10-29 ENCOUNTER — Ambulatory Visit: Admitting: Pharmacist

## 2023-10-29 ENCOUNTER — Ambulatory Visit: Admitting: Family Medicine

## 2023-10-29 ENCOUNTER — Encounter: Payer: Self-pay | Admitting: Family Medicine

## 2023-10-29 ENCOUNTER — Encounter (HOSPITAL_COMMUNITY): Payer: Self-pay

## 2023-10-29 VITALS — BP 116/62 | HR 92 | Ht 69.0 in | Wt 231.6 lb

## 2023-10-29 DIAGNOSIS — Z23 Encounter for immunization: Secondary | ICD-10-CM

## 2023-10-29 DIAGNOSIS — Z794 Long term (current) use of insulin: Secondary | ICD-10-CM

## 2023-10-29 DIAGNOSIS — F418 Other specified anxiety disorders: Secondary | ICD-10-CM

## 2023-10-29 DIAGNOSIS — N1831 Chronic kidney disease, stage 3a: Secondary | ICD-10-CM

## 2023-10-29 DIAGNOSIS — M5416 Radiculopathy, lumbar region: Secondary | ICD-10-CM | POA: Diagnosis not present

## 2023-10-29 DIAGNOSIS — S51001A Unspecified open wound of right elbow, initial encounter: Secondary | ICD-10-CM

## 2023-10-29 DIAGNOSIS — E1122 Type 2 diabetes mellitus with diabetic chronic kidney disease: Secondary | ICD-10-CM

## 2023-10-29 DIAGNOSIS — E1151 Type 2 diabetes mellitus with diabetic peripheral angiopathy without gangrene: Secondary | ICD-10-CM

## 2023-10-29 MED ORDER — MUPIROCIN 2 % EX OINT: 1.0000 | TOPICAL_OINTMENT | Freq: Two times a day (BID) | CUTANEOUS | 1 refills | Status: DC

## 2023-10-29 MED ORDER — EMPAGLIFLOZIN 25 MG PO TABS: 25.0000 mg | ORAL_TABLET | Freq: Every day | ORAL | 2 refills | Status: AC

## 2023-10-29 NOTE — Progress Notes (Signed)
 PCP - Elicia Hamlet, MD  Cardiologist - Lonni Slain, MD   PPM/ICD - denies Device Orders - n/a Rep Notified - n/a  Chest x-ray - denies EKG - DOS Stress Test - 04-09-19 ECHO - 06-10-22 Cardiac Cath - denies  CPAP - denies  GLP-1 -denies DM - Per patient MD monitors A1c empagliflozin  (JARDIANCE ) Last dose 10-29-23  Blood Thinner Instructions: denies Aspirin  Instructions: denies  ERAS Protcol - no  COVID TEST- n/  Anesthesia review: yes, HX of DM, HTN, CKD. Per patient she has a open scabbed area to right elbow.  She states she sleeps on her right side. Per patient her PCP eval this am.  Patient verbally denies any shortness of breath, fever, cough and chest pain during phone call   -------------  SDW INSTRUCTIONS given:  Your procedure is scheduled on October 30, 2023.  Report to [facility] Main Entrance A at 5:30 A.M., and check in at the Admitting office.  Call this number if you have problems the morning of surgery:  [phone]   Remember:  Do not eat or drink after midnight the night before your surgery      Take these medicines the morning of surgery with A SIP OF WATER   acetaminophen  (TYLENOL )  albuterol  (VENTOLIN  HFA) inhaler  atorvastatin  (LIPITOR)  baclofen  (LIORESAL )  busPIRone  (BUSPAR )  carvedilol  (COREG )  cloNIDine  (CATAPRES ) 0  fluticasone  (FLONASE )  nasal spray  gabapentin  (NEURONTIN )  loratadine  (CLARITIN )  NARCAN   TRELEGY  venlafaxine  XR (EFFEXOR -XR)   As of today, STOP taking any Aspirin  (unless otherwise instructed by your surgeon) Aleve , Naproxen , Ibuprofen, Motrin, Advil, Goody's, BC's, all herbal medications, fish oil, and all vitamins.                      Do not wear jewelry, make up, or nail polish            Do not wear lotions, powders, perfumes/colognes, or deodorant.            Do not shave 48 hours prior to surgery.  Men may shave face and neck.            Do not bring valuables to the hospital.            [facility] is not responsible for any belongings or valuables.  Do NOT Smoke (Tobacco/Vaping) 24 hours prior to your procedure If you use a CPAP at night, you may bring all equipment for your overnight stay.   Contacts, glasses, dentures or bridgework may not be worn into surgery.      For patients admitted to the hospital, discharge time will be determined by your treatment team.   Patients discharged the day of surgery will not be allowed to drive home, and someone needs to stay with them for 24 hours.    Special instructions:   [facility]- Preparing For Surgery  Before surgery, you can play an important role. Because skin is not sterile, your skin needs to be as free of germs as possible. You can reduce the number of germs on your skin by washing with CHG (chlorahexidine gluconate) Soap before surgery.  CHG is an antiseptic cleaner which kills germs and bonds with the skin to continue killing germs even after washing.    Oral Hygiene is also important to reduce your risk of infection.  Remember - BRUSH YOUR TEETH THE MORNING OF SURGERY WITH YOUR REGULAR TOOTHPASTE  Please do not use if you have an  allergy to CHG or antibacterial soaps. If your skin becomes reddened/irritated stop using the CHG.  Do not shave (including legs and underarms) for at least 48 hours prior to first CHG shower. It is OK to shave your face.  Please follow these instructions carefully.   Shower the NIGHT BEFORE SURGERY and the MORNING OF SURGERY with DIAL  Soap.   Pat yourself dry with a CLEAN TOWEL.  Wear CLEAN PAJAMAS to bed the night before surgery  Place CLEAN SHEETS on your bed the night of your first shower and DO NOT SLEEP WITH PETS.   Day of Surgery: Please shower morning of surgery  Wear Clean/Comfortable clothing the morning of surgery Do not apply any deodorants/lotions.   Remember to brush your teeth WITH YOUR REGULAR TOOTHPASTE.   Questions were answered. Patient verbalized understanding of  instructions.

## 2023-10-29 NOTE — Progress Notes (Addendum)
 Anesthesia Chart Review: Same day workup  Follows with cardiology for history of HFpEF, paroxysmal SVT, HTN, HLD.  Hospitalized 5/12 - 06/14/2022 for fall, acute diastolic heart failure discharged to rehab and then home after 1 week.  Echo 06/12/2022 LVEF-65%, no RWMA.  Previous echo noted to show diastolic dysfunction. Admitted 6/12-6/16/24 with elevated BNP greater than 1300, CXR with cardiomegaly.  Diuresed with IV Lasix .  Discharged on Lasix  80 mg twice daily.  Last seen in follow-up by Reche Finder, NP on 07/31/2022.  At that time spironolactone  12.5 mg daily was added to her regimen for management of HFpEF.  Diltiazem  was increased from 120 mg every day to twice daily.  She was continued on losartan  25 mg daily and Lasix  80 mg twice daily.  Subsequently admitted 8/11 through 09/13/2022 for AMS felt secondary to hypertensive emergency.  She had a seizure which required Ativan  and intubation for airway protection.  Ultimately this was also felt to be secondary to hypertensive emergency, neurology consulted and continuous long-term EEG showed no seizure activity, no need for AEDs.  Home blood pressure medications were resumed and clonidine  and carvedilol  were added with good control of blood pressure.  Follows with pulmonology for history of OHS/OSA on BiPAP, moderate COPD on Trelegy (PFTs in 2022 with FVC 65% predicted, FEV1 57% predicted, DLCO 49% predicted), mixed restrictive and obstructive lung disease, right upper lobe pulmonary nodule, current smoker. Seen in followup by Dr. Zaida and it was noted that right upper lobe nodule had increased in size on most recent CT scan 09/26/2023.  Biopsy recommended.  Other pertinent history includes non-insulin -dependent DM2 (A1c 6.8 on 09/09/2023), bipolar disorder, CKD 3, prior history of polysubstance abuse, laparoscopic sleeve gastrectomy 05/08/2020 with significant weight loss, GERD on PPI.   Patient will need day of surgery labs and evaluation.  CT chest  09/24/2023: IMPRESSION: 1. Interval increase in size and solid character of a subsolid nodule in the peripheral right upper lobe, measuring 1.7 x 1.3 cm, previously 1.0 x 0.9 cm. This is highly suspicious for an indolent adenocarcinoma. 2. No evidence of lymphadenopathy or metastatic disease in the chest. 3. Emphysema. 4. Thirteen rib-bearing thoracic type vertebral bodies. 5. Extensive anterior bridging osteophytosis and ankylosis throughout the thoracic spine, in keeping with DISH, with generally moderate disc space height loss and osteophytosis throughout, worst at the upper to mid thoracic levels, particularly T3 through T6. 6. Focally severe, destructive endplate arthrosis of T13-L1 with vacuum phenomenon, endplate destruction significantly progressed when compared to prior examination dated 05/25/2021. 7. Partially imaged postoperative findings of cervical laminectomy and fusion extending inferiorly to T2 and posterior lumbar fusion extending to L1. 8. Coronary artery disease.  TTE 06/10/2022: 1. Extremely limited; LV function appears to be normal on definity   images; color doppler suboptimal.   2. Left ventricular ejection fraction, by estimation, is 60 to 65%. The  left ventricle has normal function. The left ventricle has no regional  wall motion abnormalities. Left ventricular diastolic parameters are  indeterminate.   3. Right ventricular systolic function was not well visualized. The right  ventricular size is not well visualized.   4. The mitral valve was not well visualized. not well visualized mitral  valve regurgitation.   5. Tricuspid valve regurgitation not well visualized.   6. The aortic valve was not well visualized. Aortic valve regurgitation  not well visualized.   7. Pulmonic valve regurgitation not well visualized.   Nuclear stress 04/13/2019: The left ventricular ejection fraction is mildly decreased (45-54%).  Nuclear stress EF: 47%. There was no ST  segment deviation noted during stress. This is an intermediate risk study due to reduced systolic function. There is no ischemia. New wall motion abnormalities noted. Consider echo if clinically warranted.   Dr. Lonni commented on result stating,  she has had 2 day nuclear stress test as she cannot achieve >4 METs of activity. I personally reviewed this study. There is no evidence of ischemia. There is some question of low normal EF and wall motion, but on my review of the study, I cannot exclude this being artifact.   Event monitor 12/02/17: 3 days of rhythm interpreted on Zio patch. Patient had a min HR of 58 bpm, max HR of 193 bpm, and avg HR of 81 bpm. Predominant underlying rhythm was Sinus Rhythm. 217 Supraventricular Tachycardia runs occurred, the run with the fastest interval lasting 6 beats with a max rate of 193 bpm, the longest lasting 1 min 13 secs with an avg rate of 151 bpm. True duration of Supraventricular Tachycardia difficult to ascertain due to Artifact. Thirteen triggered patient events recorded. Supraventricular Tachycardia was detected within +/- 45 seconds of symptomatic patient event(s). Isolated SVEs were occasional (2.0%, 6813), SVE Couplets were rare (<1.0%, 235), and SVE Triplets were rare (<1.0%, 66). No significant ventricular ectopy noted.     Lynwood Geofm RIGGERS [facility] Short Stay Center/Anesthesiology Phone [phone] 10/29/2023 11:34 AM

## 2023-10-29 NOTE — Patient Instructions (Addendum)
 1) I am sending a referral for you to see a psychiatrist - Continue taking your current medications - Please seek emergency help if you feel suicidal, want to hurt yourself or others.  2) I am sending a referral for you to see a pain clinic.  3) I am sending a referral for you to see a kidney doctor. Your urine had higer levels of protein in it. - We will also increase your jardiance   4) I am sending you a referral to see a ophthalmologist  5) For your elbow wound, apply mupirocin  cream to it twice a day and wrap it with a bandage. This can help it heal. Come back in 1 month if it is not healed.  6) Call the therapist resources below to get connected with a therapist.   Therapy and Counseling Resources Most providers on this list will take Medicaid. Patients with commercial insurance or Medicare should contact their insurance company to get a list of in network providers.  [facility] (takes children) Location 1: [address] [place], [place] [address] Location 2: [address] [place], [place] [address] [phone]   [facility] (spanish speaking therapist available)(habla espanol)(take medicare and medicaid)  [address], [place], [place] [address], [place]  [email or web address] [phone]  [facility]iatry and Counseling [address] [place], [place] [address] [phone]  [facility]   [address] [place], [place] [address]      [phone]  [facility] (spanish available) [address]  [place], [place] [address] [phone]  [facility] (take [facility] and medicare) [address], [address]  [place], [place] [address]       [phone]     [facility] (virtual only) [phone]  Janit Griff[facility] [address], [place], [place] [phone]  [facility]:  [address] [place] [phone]  [facility]:  [address] [place] [phone]  [facility] (under & uninsured) [address]   [place] [phone]    [email or web address]    [facility] [address]  [place]    [phone]  [facility] -[address]     Phone:  [phone]     [facility]-  [address]  [phone]   [facility] [address]      [place], [place] [phone]  [facility]: [address] [place], [place], [place]  [phone]   [facility] (Spanish therapist) [email or web address]  [address] [place]  [address]   [place] [address]    [phone]    [facility]   [address],  [place], [place]  [phone]   [facility]  [address] [place]  [phone]  [facility]  [address] [place], [place]        [phone]  Open Access/Walk In Clinic under & uninsured  [facility]  [address] [place], [place] [phone] Crisis [phone]  [facility] [address],  (Spanish)   [address] , [place]: [phone]) 8:30 - 12; 1 - 2:30  [facility] [employer],  [address] [place], [place]    [phone]):8:30 - 12; 2 - 3PM  [facility] [employer],  [address],  [place]  [place]; [phone]):   Mon - Fri 8 AM - 5 PM  [facility] [address] [place]  MWF 12:30 to 3:00 or call to schedule an appointment  [phone]  Specific Provider options Psychology Today  [email or web address]  click on find a therapist  enter your zip code left side and select or tailor a therapist for your specific need.   [facility] Provider Directory [email or web address]  (Medicaid)   Follow all drop down to find a  provider  Social Support program [facility] [place] [phone] or [employer] [address], [place], [place] Recovery support and educational   24- Hour Availability:   [facility]  [address] [place], [place] [phone] Crisis [phone]  [facility] [phone]  [facility]  [phone]   [facility]  [phone] (after hours)  Therapeutic Alternative/Mobile Crisis   [phone]  [place]  National Suicide Hotline  760-843-0562 MERRILYN)  Call 911 or go to emergency room  [address]  [phone]);  [place] and [employer]  [phone]); [place], [place], [place], [place], [place], [place], [place]

## 2023-10-29 NOTE — Progress Notes (Signed)
    SUBJECTIVE:   CHIEF COMPLAINT / HPI:   AH is a 59yo F that   Chronic Low Back Pain - Reports chronic low back pain, especially on L side.  - She is working with ortho but is also interested in seeing a pain clinic.   - Needs an ophthalmologist    R Elbow Wound - Reports it has been present for about 5 months - She keeps rubbing the scab cause she rubs it against her bed. - It feels sore and bleeds when she bumps  Smoking - She plans to f/u with Dr. Amalia   Bipolar - Feels like her depressive and anxiety symptoms are getting worse. - She wants to see a therapist - She tried to ask her insurance, they gave her numbers to call but none of them worked out.  - Reports history of self-harm such as drug use. Denies SI.   OBJECTIVE:   BP 116/62   Pulse 92   Ht 5' 9 (1.753 m)   Wt 231 lb 9.6 oz (105.1 kg)   LMP 01/29/1992   SpO2 93%   BMI 34.20 kg/m   General: Alert, pleasant woman. NAD. Psych: denies SI HEENT: NCAT. MMM. CV: RRR, no murmurs. Cap refill <2. Resp: CTAB, no wheezing or crackles. Normal WOB on RA.  Abm: Soft, nontender, nondistended. BS present. Ext: Moves all ext spontaneously Skin: Crusted raised tender lesion on R elbow, minimal erythema. No fluctuance or warmth.       ASSESSMENT/PLAN:   Assessment & Plan Type 2 diabetes mellitus with stage 3a chronic kidney disease, with long-term current use of insulin  (HCC) UACR elevated at prior.  - increase jardiance  10 to 25mg  daily - cont losartan  100mg  daily - nephrology referral Diabetes mellitus type 2 with peripheral artery disease [facility]) - ophthalmology referral for diabetic eye exam Lumbar radiculopathy - Pain clinic referral - Encouraged to continue f/u with Ortho for multimodal pain approach Depression with anxiety - psychiatry referral - Provided therapy resources Open wound of right elbow, initial encounter Differential includes chronic wound w/ poor wound healing 2/2 vascular disease  and T2DM or potenital malignancy (SCC, BCC). Will treat with wound care for 1 month and f/u.  - mupirocin  topical BID and counseled on wound care - f/u in 1 month, consider biopsy if wound persistent Encounter for immunization      Twyla Nearing, MD [facility]

## 2023-10-29 NOTE — Anesthesia Preprocedure Evaluation (Signed)
 Anesthesia Evaluation  Patient identified by MRN, date of birth, ID band Patient awake    Reviewed: Allergy & Precautions, NPO status , Patient's Chart, lab work & pertinent test results  Airway Mallampati: II  TM Distance: >3 FB Neck ROM: Full    Dental   Pulmonary shortness of breath, asthma , sleep apnea and Continuous Positive Airway Pressure Ventilation , COPD, Current Smoker and Patient abstained from smoking.   breath sounds clear to auscultation       Cardiovascular hypertension, Pt. on medications and Pt. on home beta blockers +CHF  + dysrhythmias  Rhythm:Regular Rate:Normal     Neuro/Psych  Neuromuscular disease    GI/Hepatic ,GERD  Medicated,,(+) Hepatitis -, B  Endo/Other  diabetes, Type 2    Renal/GU CRFRenal disease     Musculoskeletal  (+) Arthritis ,    Abdominal   Peds  Hematology negative hematology ROS (+)   Anesthesia Other Findings   Reproductive/Obstetrics                              Anesthesia Physical Anesthesia Plan  ASA: 3  Anesthesia Plan: General   Post-op Pain Management: Minimal or no pain anticipated   Induction: Intravenous  PONV Risk Score and Plan: 2 and Dexamethasone  and Ondansetron   Airway Management Planned: Oral ETT  Additional Equipment: None  Intra-op Plan:   Post-operative Plan: Extubation in OR  Informed Consent: I have reviewed the patients History and Physical, chart, labs and discussed the procedure including the risks, benefits and alternatives for the proposed anesthesia with the patient or authorized representative who has indicated his/her understanding and acceptance.     Dental advisory given  Plan Discussed with:   Anesthesia Plan Comments: (PAT note by Lynwood Hope, PA-C: Follows with cardiology for history of HFpEF, paroxysmal SVT, HTN, HLD. Hospitalized 5/12 - 06/14/2022 for fall, acute diastolic heart failure  discharged to rehab and then home after 1 week.  Echo 06/12/2022 LVEF-65%, no RWMA.  Previous echo noted to show diastolic dysfunction. Admitted 6/12-6/16/24 with elevated BNP greater than 1300, CXR with cardiomegaly.  Diuresed with IV Lasix .  Discharged on Lasix  80 mg twice daily.  Last seen in follow-up by Reche Finder, NP on 07/31/2022.  At that time spironolactone  12.5 mg daily was added to her regimen for management of HFpEF.  Diltiazem  was increased from 120 mg every day to twice daily.  She was continued on losartan  25 mg daily and Lasix  80 mg twice daily.  Subsequently admitted 8/11 through 09/13/2022 for AMS felt secondary to hypertensive emergency.  She had a seizure which required Ativan  and intubation for airway protection.  Ultimately this was also felt to be secondary to hypertensive emergency, neurology consulted and continuous long-term EEG showed no seizure activity, no need for AEDs.  Home blood pressure medications were resumed and clonidine  and carvedilol  were added with good control of blood pressure.  Follows with pulmonology for history of OHS/OSA on BiPAP, moderate COPD on Trelegy (PFTs in 2022 with FVC 65% predicted, FEV1 57% predicted, DLCO 49% predicted), mixed restrictive and obstructive lung disease, right upper lobe pulmonary nodule, current smoker. Seen in followup by Dr. Zaida and it was noted that right upper lobe nodule had increased in size on most recent CT scan 09/26/2023.  Biopsy recommended.  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  Patient will need day of surgery labs and evaluation.  CT chest 09/24/2023: IMPRESSION: 1. Interval increase in size and solid character of a subsolid nodule in the peripheral right upper lobe, measuring 1.7 x 1.3 cm, previously 1.0 x 0.9 cm. This is  highly suspicious for an indolent adenocarcinoma. 2. No evidence of lymphadenopathy or metastatic disease in the chest. 3. Emphysema. 4. Thirteen rib-bearing thoracic type vertebral bodies. 5. Extensive anterior bridging osteophytosis and ankylosis throughout the thoracic spine, in keeping with DISH, with generally moderate disc space height loss and osteophytosis throughout, worst at the upper to mid thoracic levels, particularly T3 through T6. 6. Focally severe, destructive endplate arthrosis of T13-L1 with vacuum phenomenon, endplate destruction significantly progressed when compared to prior examination dated 05/25/2021. 7. Partially imaged postoperative findings of cervical laminectomy and fusion extending inferiorly to T2 and posterior lumbar fusion extending to L1. 8. Coronary artery disease.  TTE 06/10/2022: 1. Extremely limited; LV function appears to be normal on definity   images; color doppler suboptimal.  2. Left ventricular ejection fraction, by estimation, is 60 to 65%. The  left ventricle has normal function. The left ventricle has no regional  wall motion abnormalities. Left ventricular diastolic parameters are  indeterminate.  3. Right ventricular systolic function was not well visualized. The right  ventricular size is not well visualized.  4. The mitral valve was not well visualized. not well visualized mitral  valve regurgitation.  5. Tricuspid valve regurgitation not well visualized.  6. The aortic valve was not well visualized. Aortic valve regurgitation  not well visualized.  7. Pulmonic valve regurgitation not well visualized.   Nuclear stress 04/13/2019:  The left ventricular ejection fraction is mildly decreased (45-54%).  Nuclear stress EF: 47%.  There was no ST segment deviation noted during stress.  This is an intermediate risk study due to reduced systolic function. There is no ischemia.  New wall motion abnormalities noted. Consider echo  if clinically warranted.  Dr. Lonni commented on result stating, she has had 2 day nuclear stress test as she cannot achieve >4 METs of activity. I personally reviewed this study. There is no evidence of ischemia. There is some question of low normal EF and wall motion, but on my review of the study, I cannot exclude this being artifact.  Event monitor 12/02/17: 3 days of rhythm interpreted on Zio patch. Patient had a min HR of 58 bpm, max HR of 193 bpm, and avg HR of 81 bpm. Predominant underlying rhythm was Sinus Rhythm. 217 Supraventricular Tachycardia runs occurred, the run with the fastest interval lasting 6 beats with a max rate of 193 bpm, the longest lasting 1 min 13 secs with an avg rate of 151 bpm. True duration of Supraventricular Tachycardia difficult to ascertain due to Artifact. Thirteen triggered patient events recorded. Supraventricular Tachycardia was detected within +/- 45 seconds of symptomatic patient event(s). Isolated SVEs were occasional (2.0%, 6813), SVE Couplets were rare (<1.0%, 235), and SVE Triplets were rare (<1.0%, 66). No significant ventricular ectopy noted.   )         Anesthesia Quick Evaluation

## 2023-10-30 ENCOUNTER — Ambulatory Visit (HOSPITAL_COMMUNITY)

## 2023-10-30 ENCOUNTER — Ambulatory Visit (HOSPITAL_COMMUNITY): Admission: RE | Admit: 2023-10-30 | Discharge: 2023-10-30 | Disposition: A | Discharge status: Home / Self Care

## 2023-10-30 ENCOUNTER — Encounter (HOSPITAL_COMMUNITY): Payer: Self-pay

## 2023-10-30 ENCOUNTER — Other Ambulatory Visit: Payer: Self-pay

## 2023-10-30 ENCOUNTER — Ambulatory Visit (HOSPITAL_BASED_OUTPATIENT_CLINIC_OR_DEPARTMENT_OTHER): Admitting: Physician Assistant

## 2023-10-30 ENCOUNTER — Ambulatory Visit

## 2023-10-30 ENCOUNTER — Ambulatory Visit (HOSPITAL_COMMUNITY): Admitting: Physician Assistant

## 2023-10-30 ENCOUNTER — Encounter (HOSPITAL_COMMUNITY): Admission: RE | Disposition: A | Discharge status: Home / Self Care | Payer: Self-pay | Source: Home / Self Care

## 2023-10-30 DIAGNOSIS — Z7984 Long term (current) use of oral hypoglycemic drugs: Secondary | ICD-10-CM | POA: Diagnosis not present

## 2023-10-30 DIAGNOSIS — Z981 Arthrodesis status: Secondary | ICD-10-CM | POA: Diagnosis not present

## 2023-10-30 DIAGNOSIS — E785 Hyperlipidemia, unspecified: Secondary | ICD-10-CM | POA: Insufficient documentation

## 2023-10-30 DIAGNOSIS — N184 Chronic kidney disease, stage 4 (severe): Secondary | ICD-10-CM

## 2023-10-30 DIAGNOSIS — I130 Hypertensive heart and chronic kidney disease with heart failure and stage 1 through stage 4 chronic kidney disease, or unspecified chronic kidney disease: Secondary | ICD-10-CM

## 2023-10-30 DIAGNOSIS — G4733 Obstructive sleep apnea (adult) (pediatric): Secondary | ICD-10-CM | POA: Diagnosis not present

## 2023-10-30 DIAGNOSIS — R911 Solitary pulmonary nodule: Secondary | ICD-10-CM

## 2023-10-30 DIAGNOSIS — E1142 Type 2 diabetes mellitus with diabetic polyneuropathy: Secondary | ICD-10-CM | POA: Insufficient documentation

## 2023-10-30 DIAGNOSIS — Z48813 Encounter for surgical aftercare following surgery on the respiratory system: Secondary | ICD-10-CM | POA: Diagnosis not present

## 2023-10-30 DIAGNOSIS — I5032 Chronic diastolic (congestive) heart failure: Secondary | ICD-10-CM | POA: Insufficient documentation

## 2023-10-30 DIAGNOSIS — F17210 Nicotine dependence, cigarettes, uncomplicated: Secondary | ICD-10-CM | POA: Insufficient documentation

## 2023-10-30 DIAGNOSIS — Z79899 Other long term (current) drug therapy: Secondary | ICD-10-CM | POA: Diagnosis not present

## 2023-10-30 DIAGNOSIS — I5033 Acute on chronic diastolic (congestive) heart failure: Secondary | ICD-10-CM | POA: Diagnosis not present

## 2023-10-30 DIAGNOSIS — E1122 Type 2 diabetes mellitus with diabetic chronic kidney disease: Secondary | ICD-10-CM | POA: Diagnosis not present

## 2023-10-30 DIAGNOSIS — I471 Supraventricular tachycardia, unspecified: Secondary | ICD-10-CM | POA: Diagnosis not present

## 2023-10-30 DIAGNOSIS — N183 Chronic kidney disease, stage 3 unspecified: Secondary | ICD-10-CM | POA: Insufficient documentation

## 2023-10-30 DIAGNOSIS — J439 Emphysema, unspecified: Secondary | ICD-10-CM | POA: Diagnosis not present

## 2023-10-30 DIAGNOSIS — K449 Diaphragmatic hernia without obstruction or gangrene: Secondary | ICD-10-CM | POA: Diagnosis not present

## 2023-10-30 HISTORY — PX: VIDEO BRONCHOSCOPY WITH ENDOBRONCHIAL NAVIGATION: SHX6175

## 2023-10-30 HISTORY — PX: BRONCHIAL BRUSHINGS: SHX5108

## 2023-10-30 HISTORY — PX: BRONCHIAL NEEDLE ASPIRATION BIOPSY: SHX5106

## 2023-10-30 LAB — BASIC METABOLIC PANEL WITH GFR
Anion gap: 9 (ref 5–15)
BUN: 22 mg/dL — ABNORMAL HIGH (ref 6–20)
CO2: 22 mmol/L (ref 22–32)
Calcium: 9 mg/dL (ref 8.9–10.3)
Chloride: 106 mmol/L (ref 98–111)
Creatinine, Ser: 1.03 mg/dL — ABNORMAL HIGH (ref 0.44–1.00)
GFR, Estimated: >60 mL/min (ref >60)
Glucose, Bld: 99 mg/dL (ref 70–99)
Potassium: 4.1 mmol/L (ref 3.5–5.1)
Sodium: 137 mmol/L (ref 135–145)

## 2023-10-30 LAB — GLUCOSE, CAPILLARY
Glucose-Capillary: 106 mg/dL — ABNORMAL HIGH (ref 70–99)
Glucose-Capillary: 78 mg/dL (ref 70–99)
Glucose-Capillary: 93 mg/dL (ref 70–99)
Glucose-Capillary: 121 mg/dL — ABNORMAL HIGH (ref 70–99)

## 2023-10-30 LAB — CBC
HCT: 46.8 % — ABNORMAL HIGH (ref 36.0–46.0)
Hemoglobin: 16.5 g/dL — ABNORMAL HIGH (ref 12.0–15.0)
MCH: 29.6 pg (ref 26.0–34.0)
MCHC: 35.3 g/dL (ref 30.0–36.0)
MCV: 84 fL (ref 80.0–100.0)
Platelets: 299 K/uL (ref 150–400)
RBC: 5.57 MIL/uL — ABNORMAL HIGH (ref 3.87–5.11)
RDW: 13.7 % (ref 11.5–15.5)
WBC: 11.2 K/uL — ABNORMAL HIGH (ref 4.0–10.5)
nRBC: 0 % (ref 0.0–0.2)

## 2023-10-30 SURGERY — VIDEO BRONCHOSCOPY WITH ENDOBRONCHIAL NAVIGATION
Anesthesia: General

## 2023-10-30 MED ORDER — LIDOCAINE 2% (20 MG/ML) 5 ML SYRINGE
INTRAMUSCULAR | Status: DC | PRN
  Administered 2023-10-30: 100 mg via INTRAVENOUS

## 2023-10-30 MED ORDER — FENTANYL CITRATE (PF) 100 MCG/2ML IJ SOLN
INTRAMUSCULAR | Status: AC
  Filled 2023-10-30: qty 2

## 2023-10-30 MED ORDER — PROPOFOL 10 MG/ML IV BOLUS
INTRAVENOUS | Status: DC | PRN
  Administered 2023-10-30: 200 mg via INTRAVENOUS

## 2023-10-30 MED ORDER — ESMOLOL HCL 100 MG/10ML IV SOLN
INTRAVENOUS | Status: DC | PRN
Start: 2023-10-30 — End: 2023-10-30
  Administered 2023-10-30: 20 mg via INTRAVENOUS
  Administered 2023-10-30: 30 mg via INTRAVENOUS

## 2023-10-30 MED ORDER — ONDANSETRON HCL 4 MG/2ML IJ SOLN
INTRAMUSCULAR | Status: DC | PRN
  Administered 2023-10-30: 4 mg via INTRAVENOUS

## 2023-10-30 MED ORDER — SUGAMMADEX SODIUM 200 MG/2ML IV SOLN
INTRAVENOUS | Status: DC | PRN
  Administered 2023-10-30: 400 mg via INTRAVENOUS

## 2023-10-30 MED ORDER — CHLORHEXIDINE GLUCONATE 0.12 % MT SOLN
15.0000 mL | Freq: Once | OROMUCOSAL | Status: AC
  Administered 2023-10-30: 15 mL via OROMUCOSAL

## 2023-10-30 MED ORDER — DEXAMETHASONE SODIUM PHOSPHATE 10 MG/ML IJ SOLN
INTRAMUSCULAR | Status: DC | PRN
  Administered 2023-10-30: 5 mg via INTRAVENOUS

## 2023-10-30 MED ORDER — INSULIN ASPART 100 UNIT/ML IJ SOLN: 0.0000 [IU] | INTRAMUSCULAR | Status: DC | PRN

## 2023-10-30 MED ORDER — LACTATED RINGERS IV SOLN: INTRAVENOUS | Status: DC

## 2023-10-30 MED ORDER — PROPOFOL 500 MG/50ML IV EMUL
INTRAVENOUS | Status: DC | PRN
  Administered 2023-10-30: 150 ug/kg/min via INTRAVENOUS

## 2023-10-30 MED ORDER — PHENYLEPHRINE 80 MCG/ML (10ML) SYRINGE FOR IV PUSH (FOR BLOOD PRESSURE SUPPORT)
PREFILLED_SYRINGE | INTRAVENOUS | Status: DC | PRN
  Administered 2023-10-30: 160 ug via INTRAVENOUS

## 2023-10-30 MED ORDER — ROCURONIUM BROMIDE 10 MG/ML (PF) SYRINGE
PREFILLED_SYRINGE | INTRAVENOUS | Status: DC | PRN
  Administered 2023-10-30: 20 mg via INTRAVENOUS
  Administered 2023-10-30: 50 mg via INTRAVENOUS
  Administered 2023-10-30: 20 mg via INTRAVENOUS

## 2023-10-30 NOTE — Op Note (Signed)
 Video Bronchoscopy with Endobronchial Ultrasound and Electromagnetic Navigation Procedure Note  Date of Operation: 10/30/2023  Pre-op Diagnosis: Right upper lobe lung nodule     Surgeon: Zola Zaida Cy Bonnie Assaker     Anesthesia: General endotracheal anesthesia  Operation: Flexible video fiberoptic bronchoscopy with endobronchial ultrasound, robotic assisted navigation and biopsies.  Estimated Blood Loss: Minimal  Complications: None  Indications and History: [name] is a 58 y.o. female with a right upper lobe peripheral nodule.  Recommendation was made to achieve a tissue diagnosis using endobronchial ultrasound and robotic assisted navigational bronchoscopy. The risks, benefits, complications, treatment options and expected outcomes were discussed with the patient.  The possibilities of pneumothorax, pneumonia, reaction to medication, pulmonary aspiration, perforation of a viscus, bleeding, failure to diagnose a condition and creating a complication requiring transfusion or operation were discussed with the patient who freely signed the consent.    Description of Procedure: The patient was seen in the Preoperative Area, was examined and was deemed appropriate to proceed.  The patient was [facility] Endoscopy room 3, identified as Bernarda LILLETTE Fanti and the procedure verified as Flexible Video Fiberoptic Bronchoscopy with robotic assisted navigation and endobronchial ultrasound.  A Time Out was held and the above information confirmed.   Robotic assisted navigation: Prior to the date of the procedure a high-resolution CT scan of the chest was performed. Utilizing ION software program a virtual tracheobronchial tree was generated to allow the creation of distinct navigation pathways to the patient's parenchymal abnormalities. After being taken to the operating room general anesthesia was initiated and the patient  was orally intubated. The video fiberoptic bronchoscope was  introduced via the endotracheal tube and a general inspection was performed which showed normal right and left lung anatomy. Aspiration of the bilateral mainstems was completed to remove any remaining secretions. Robotic catheter inserted into patient's endotracheal tube.   Target #1 right upper lobe nodule: The distinct navigation pathways prepared prior to this procedure were then utilized to navigate to patient's lesion identified on CT scan. The robotic catheter was secured into place and the vision probe was withdrawn.  Lesion location was approximated using fluoroscopy.  Local registration and targeting was performed using [employer] Cios mobile C-arm three-dimensional imaging. Under fluoroscopic guidance transbronchial needle brushings, transbronchial needle biopsies were performed to be sent for cytology and pathology.  Needle-in-lesion was confirmed using Cios mobile C-arm.  A bronchioalveolar lavage was performed in the right upper lobe and sent for cytology.    At the end of the procedure a general airway inspection was performed and there was no evidence of active bleeding. The bronchoscope was removed.  The patient tolerated the procedure well. There was no significant blood loss and there were no obvious complications. A post-procedural chest x-ray is pending.  Samples Target #1: 1. Transbronchial needle brushings from right upper lobe nodule 2. Transbronchial Wang needle biopsies from the right upper lobe nodule 4. Bronchoalveolar lavage from right upper lobe nodule    Zola Zaida, MD [facility] Office: [phone]

## 2023-10-30 NOTE — Anesthesia Postprocedure Evaluation (Signed)
 Anesthesia Post Note  Patient: [name]  Procedure(s) Performed: VIDEO BRONCHOSCOPY WITH ENDOBRONCHIAL NAVIGATION BRONCHOSCOPY, WITH NEEDLE ASPIRATION BIOPSY BRONCHOSCOPY, WITH BRUSH BIOPSY     Patient location during evaluation: PACU Anesthesia Type: General Level of consciousness: awake and alert Pain management: pain level controlled Vital Signs Assessment: post-procedure vital signs reviewed and stable Respiratory status: spontaneous breathing, nonlabored ventilation, respiratory function stable and patient connected to nasal cannula oxygen  Cardiovascular status: blood pressure returned to baseline and stable Postop Assessment: no apparent nausea or vomiting Anesthetic complications: no   No notable events documented.  Last Vitals:  Vitals:   10/30/23 1217 10/30/23 1232  BP: (!) 108/59 (!) 144/73  Pulse: 83 81  Resp: 15 20  Temp:  36.7 C  SpO2: 91% 92%    Last Pain:  Vitals:   10/30/23 1232  TempSrc:   PainSc: Asleep   Pain Goal:                   Garnette DELENA Gab

## 2023-10-30 NOTE — Progress Notes (Signed)
 Pt states she took her empagliflozin  (JARDIANCE ), losartan  (COZAAR ) and spironolactone  (ALDACTONE )  at 3 am this morning. Dr. JONELLE Hackney is aware.

## 2023-10-30 NOTE — Anesthesia Procedure Notes (Signed)
 Procedure Name: Intubation Date/Time: 10/30/2023 10:43 AM  Performed by: Dianne Burnard RAMAN, CRNAPre-anesthesia Checklist: Patient identified, Emergency Drugs available, Suction available and Patient being monitored Patient Re-evaluated:Patient Re-evaluated prior to induction Oxygen  Delivery Method: Circle system utilized Preoxygenation: Pre-oxygenation with 100% oxygen  Induction Type: IV induction Ventilation: Mask ventilation without difficulty Laryngoscope Size: McGrath and 3 Grade View: Grade I Tube type: Oral Tube size: 8.5 mm Number of attempts: 1 Airway Equipment and Method: Stylet and Oral airway Placement Confirmation: ETT inserted through vocal cords under direct vision, positive ETCO2 and breath sounds checked- equal and bilateral Secured at: 21 cm Tube secured with: Tape Dental Injury: Teeth and Oropharynx as per pre-operative assessment

## 2023-10-30 NOTE — Transfer of Care (Signed)
 Immediate Anesthesia Transfer of Care Note  Patient: [name]  Procedure(s) Performed: VIDEO BRONCHOSCOPY WITH ENDOBRONCHIAL NAVIGATION BRONCHOSCOPY, WITH EBUS BRONCHOSCOPY, WITH NEEDLE ASPIRATION BIOPSY  Patient Location: PACU  Anesthesia Type:General  Level of Consciousness: awake, alert , and oriented  Airway & Oxygen  Therapy: Patient Spontanous Breathing  Post-op Assessment: Report given to RN and Post -op Vital signs reviewed and stable  Post vital signs: Reviewed and stable  Last Vitals:  Vitals Value Taken Time  BP    Temp    Pulse    Resp    SpO2      Last Pain:  Vitals:   10/30/23 0553  TempSrc: Oral         Complications: No notable events documented.

## 2023-10-30 NOTE — Procedures (Signed)
 Procedure Note  Patient: [name]  [employer] Cios/GE 3D OEC mobile C-arm was utilized to identify and biopsy of the right upper lobe nodule.  Needle-in-lesion was confirmed using real-time Cios/GE 3D OEC imaging, and images were uploaded to PACS.   Mary LOISE Herter, MD [facility] 10/30/2023 12:11 PM

## 2023-10-30 NOTE — Interval H&P Note (Signed)
 History and Physical Interval Note:  10/30/2023 7:47 AM  [name]  has presented today for surgery, with the diagnosis of Lung nodule.  The various methods of treatment have been discussed with the patient and family. After consideration of risks, benefits and other options for treatment, the patient has consented to  Procedure(s): VIDEO BRONCHOSCOPY WITH ENDOBRONCHIAL NAVIGATION (N/A) BRONCHOSCOPY, WITH EBUS (N/A) as a surgical intervention.  The patient's history has been reviewed, patient examined, no change in status, stable for surgery.  I have reviewed the patient's chart and labs.  Questions were answered to the patient's satisfaction.     [name]

## 2023-10-31 ENCOUNTER — Encounter (HOSPITAL_COMMUNITY): Payer: Self-pay

## 2023-10-31 LAB — CYTOLOGY - NON PAP

## 2023-11-02 DIAGNOSIS — S51001A Unspecified open wound of right elbow, initial encounter: Secondary | ICD-10-CM | POA: Insufficient documentation

## 2023-11-02 NOTE — Assessment & Plan Note (Signed)
-   psychiatry referral - Provided therapy resources

## 2023-11-02 NOTE — Assessment & Plan Note (Signed)
-   Pain clinic referral - Encouraged to continue f/u with Ortho for multimodal pain approach

## 2023-11-02 NOTE — Assessment & Plan Note (Addendum)
 Differential includes chronic wound w/ poor wound healing 2/2 vascular disease and T2DM or potenital malignancy (SCC, BCC). Will treat with wound care for 1 month and f/u.  - mupirocin  topical BID and counseled on wound care - f/u in 1 month, consider biopsy if wound persistent

## 2023-11-02 NOTE — Assessment & Plan Note (Signed)
 UACR elevated at prior.  - increase jardiance  10 to 25mg  daily - cont losartan  100mg  daily - nephrology referral

## 2023-11-03 ENCOUNTER — Encounter: Payer: Self-pay | Admitting: Family Medicine

## 2023-11-03 ENCOUNTER — Other Ambulatory Visit: Payer: Self-pay | Admitting: Student

## 2023-11-04 ENCOUNTER — Other Ambulatory Visit

## 2023-11-04 ENCOUNTER — Ambulatory Visit: Admitting: Orthopaedic Surgery

## 2023-11-04 ENCOUNTER — Ambulatory Visit: Payer: Self-pay

## 2023-11-04 DIAGNOSIS — M25561 Pain in right knee: Secondary | ICD-10-CM | POA: Diagnosis not present

## 2023-11-04 DIAGNOSIS — G8929 Other chronic pain: Secondary | ICD-10-CM

## 2023-11-04 DIAGNOSIS — M1711 Unilateral primary osteoarthritis, right knee: Secondary | ICD-10-CM

## 2023-11-04 DIAGNOSIS — M25511 Pain in right shoulder: Secondary | ICD-10-CM

## 2023-11-04 DIAGNOSIS — R911 Solitary pulmonary nodule: Secondary | ICD-10-CM

## 2023-11-04 MED ORDER — BUPIVACAINE HCL 0.5 % IJ SOLN
3.0000 mL | INTRAMUSCULAR | Status: AC | PRN
  Administered 2023-11-04: 3 mL via INTRA_ARTICULAR

## 2023-11-04 MED ORDER — LIDOCAINE HCL 1 % IJ SOLN
2.0000 mL | INTRAMUSCULAR | Status: AC | PRN
  Administered 2023-11-04: 2 mL

## 2023-11-04 MED ORDER — METHYLPREDNISOLONE ACETATE 40 MG/ML IJ SUSP
40.0000 mg | INTRAMUSCULAR | Status: AC | PRN
  Administered 2023-11-04: 40 mg via INTRA_ARTICULAR

## 2023-11-04 MED ORDER — BUPIVACAINE HCL 0.5 % IJ SOLN
2.0000 mL | INTRAMUSCULAR | Status: AC | PRN
  Administered 2023-11-04: 2 mL via INTRA_ARTICULAR

## 2023-11-04 MED ORDER — LIDOCAINE HCL 1 % IJ SOLN
3.0000 mL | INTRAMUSCULAR | Status: AC | PRN
  Administered 2023-11-04: 3 mL

## 2023-11-04 NOTE — Progress Notes (Signed)
 Office Visit Note   Patient: [name]           Date of Birth: May 03, 1965           MRN: [id number] Visit Date: 11/04/2023              Requested by: Elicia Hamlet, MD [address] [place],  [place] [address] PCP: Elicia Hamlet, MD   Assessment & Plan: Visit Diagnoses:  1. Chronic right shoulder pain   2. Primary osteoarthritis of right knee     Plan: History of Present Illness [name] is a 58 year old female who presents with right knee and right shoulder pain.  She experiences significant pain in her right shoulder following a fall from her bed approximately four to five months ago. The shoulder is swollen, and she is unable to raise it beyond 45 degrees without pain. An MRI from July 2024 shows chronic rotator cuff tears. She has not had surgery on the right shoulder, but the left shoulder was operated on previously.  She also experiences severe pain in her right knee, described as 'bone on bone' arthritis. She has a history of knee surgery and fell last year on Mother's Day, resulting in hospitalization. She is concerned about falling again due to the knee pain.  She has experienced significant weight loss following gastric bypass surgery, reducing her weight from 419 pounds to 227.4 pounds, with a goal of reaching 200 pounds. She is currently trying to quit smoking.  Physical Exam MEASUREMENTS: Weight- 227.4. MUSCULOSKELETAL: Right shoulder with limited range of motion and pain on active range of motion. Right knee shows fully healed surgical scars.  Slight valgus deformity.  No joint effusion.  Pain and crepitus with range of motion.  Assessment and Plan Right knee osteoarthritis, advanced valgus deformity Severe osteoarthritis with bone-on-bone changes confirmed by x-ray. Significant pain and instability with history of falls. - Administer cortisone injection to the right knee. - Obtain authorization for gel injection for the right knee. - Order home health  physical therapy for the right knee.  Right shoulder chronic rotator cuff tear with pain Chronic rotator cuff tear confirmed by MRI. Recent fall exacerbated pain and limited range of motion. Unlikely to be surgically repairable. - Refer to physical therapy for the right shoulder. - Administer steroid injection into subacromial and glenohumeral spaces today  This patient is diagnosed with osteoarthritis of the knee(s).    Radiographs show evidence of joint space narrowing, osteophytes, subchondral sclerosis and/or subchondral cysts.  This patient has knee pain which interferes with functional and activities of daily living.    This patient has experienced inadequate response, adverse effects and/or intolerance with conservative treatments such as acetaminophen , NSAIDS, topical creams, physical therapy or regular exercise, knee bracing and/or weight loss.   This patient has experienced inadequate response or has a contraindication to intra articular steroid injections for at least 3 months.   This patient is not scheduled to have a total knee replacement within 6 months of starting treatment with viscosupplementation.   Follow-Up Instructions: No follow-ups on file.   Orders:  Orders Placed This Encounter  Procedures   Large Joint Inj   Large Joint Inj   XR Shoulder Right   XR KNEE 3 VIEW RIGHT   Ambulatory request for injection medication   Ambulatory referral to Physical Therapy   No orders of the defined types were placed in this encounter.     Procedures: Large Joint Inj: R subacromial  bursa on 11/04/2023 4:53 PM Indications: pain Details: 22 G needle  Arthrogram: No  Medications: 3 mL lidocaine  1 %; 3 mL bupivacaine  0.5 %; 40 mg methylPREDNISolone  acetate 40 MG/ML Outcome: tolerated well, no immediate complications Consent was given by the patient. Patient was prepped and draped in the usual sterile fashion.    Large Joint Inj: R knee on 11/04/2023 4:53 PM Indications:  pain Details: 22 G needle  Arthrogram: No  Medications: 40 mg methylPREDNISolone  acetate 40 MG/ML; 2 mL lidocaine  1 %; 2 mL bupivacaine  0.5 % Consent was given by the patient. Patient was prepped and draped in the usual sterile fashion.    Large Joint Inj: R glenohumeral on 11/04/2023 4:57 PM Indications: pain Details: 22 G needle  Arthrogram: No  Medications: 3 mL lidocaine  1 %; 3 mL bupivacaine  0.5 %; 40 mg methylPREDNISolone  acetate 40 MG/ML Outcome: tolerated well, no immediate complications Consent was given by the patient. Patient was prepped and draped in the usual sterile fashion.     Clinical Data: No additional findings.   Subjective: Chief Complaint  Patient presents with   Right Shoulder - Pain   Right Knee - Pain    HPI  Review of Systems  Constitutional: Negative.   HENT: Negative.    Eyes: Negative.   Respiratory: Negative.    Cardiovascular: Negative.   Endocrine: Negative.   Musculoskeletal: Negative.   Neurological: Negative.   Hematological: Negative.   Psychiatric/Behavioral: Negative.    All other systems reviewed and are negative.    Objective: Vital Signs: LMP 01/29/1992   Physical Exam Vitals and nursing note reviewed.  Constitutional:      Appearance: She is well-developed.  HENT:     Head: Atraumatic.     Nose: Nose normal.  Eyes:     Extraocular Movements: Extraocular movements intact.  Cardiovascular:     Pulses: Normal pulses.  Pulmonary:     Effort: Pulmonary effort is normal.  Abdominal:     Palpations: Abdomen is soft.  Musculoskeletal:     Cervical back: Neck supple.  Skin:    General: Skin is warm.     Capillary Refill: Capillary refill takes less than 2 seconds.  Neurological:     Mental Status: She is alert. Mental status is at baseline.  Psychiatric:        Behavior: Behavior normal.        Thought Content: Thought content normal.        Judgment: Judgment normal.     Ortho Exam  Specialty Comments:   No specialty comments available.  Imaging: XR Shoulder Right Result Date: 11/04/2023 3 view xrays show no acute abnormalities.  Degenerative changes throughout the shoulder.    XR KNEE 3 VIEW RIGHT Result Date: 11/04/2023 X-rays demonstrate severe tricompartmental osteoarthritis.  Bone-on-bone joint space narrowing.  Kellgren-Lawrence stage IV    PMFS History: Patient Active Problem List   Diagnosis Date Noted   Open wound of right elbow 11/02/2023   Chronic pain syndrome 01/15/2023   Headache 09/13/2022   Opiate withdrawal (HCC) 09/13/2022   Hypertension 09/12/2022   Chronic diastolic HF (heart failure) (HCC) 09/12/2022   Delirium 09/09/2022   AMS (altered mental status) 09/09/2022   Seizure (HCC) 09/09/2022   Malnutrition of moderate degree 09/09/2022   CHF (congestive heart failure) (HCC) 07/11/2022   Acute on chronic diastolic congestive heart failure (HCC) 07/10/2022   Acute on chronic heart failure with preserved ejection fraction (HFpEF) (HCC) 06/09/2022   Acute exacerbation of CHF (  congestive heart failure) (HCC) 06/09/2022   Panniculitis 12/04/2021   Lumbar stenosis with neurogenic claudication 06/29/2021   Lumbar radiculopathy 06/29/2021   Biceps tendinopathy of right upper extremity 05/03/2021   Irregular heartbeat 04/11/2021   Neuropathic pain of foot 03/28/2021   Impingement syndrome of right shoulder 03/08/2021   Arthritis of right acromioclavicular joint 03/08/2021   Acute medial meniscus tear of right knee 03/08/2021   Acute lateral meniscus tear of right knee 03/08/2021   Spinal stenosis of thoracic region with radiculopathy    Bilateral leg numbness    Spinal stenosis of lumbar region with neurogenic claudication    Primary osteoarthritis of right knee    Impaired ambulation 01/10/2021   Status post lumbar laminectomy 10/03/2020   Cubital tunnel syndrome on left 05/02/2020   Prolapse of anterior vaginal wall 11/10/2019   Skin ulcer of left thigh,  limited to breakdown of skin (HCC) 09/09/2019   Ulnar nerve compression 08/06/2019   Diabetic neuropathy associated with type 2 diabetes mellitus (HCC) 08/06/2019   Body mass index 50.0-59.9, adult (HCC) 06/12/2019   Impingement syndrome of left shoulder 05/07/2019   Polyp of colon    Osteoarthritis of right hip 02/12/2019   Nontraumatic tear of left supraspinatus tendon 12/30/2018   Gout 12/17/2018   Nontraumatic complete tear of right rotator cuff 12/08/2018   Tobacco abuse 08/21/2018   Right groin pain 07/20/2018   Paroxysmal SVT (supraventricular tachycardia) 03/20/2018   Pure hypercholesterolemia 03/20/2018   Muscle cramping 02/25/2018   Lung nodule 02/13/2018   Falls, subsequent encounter 01/30/2018   Cervical post-laminectomy syndrome 11/13/2017   Essential hypertension 10/27/2017   Solitary pulmonary nodule 10/07/2017   Restrictive lung disease secondary to obesity 10/01/2017   Polycythemia, secondary 10/01/2017   Chronic viral hepatitis B without delta-agent (HCC) 07/08/2017   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/27/2017   Chronic kidney disease (CKD), stage IV (severe) (HCC) 01/14/2017   Decubitus ulcer 12/13/2016   Type 2 diabetes mellitus with stage 3 chronic kidney disease, with long-term current use of insulin  (HCC) 12/12/2016   Urge incontinence of urine 12/05/2016   Trigger finger, left index finger 07/15/2016   Back pain 04/07/2014   Morbid obesity (HCC) 10/02/2012   Chronic diastolic CHF (congestive heart failure) (HCC) 04/07/2012   Skin lesion of scalp 03/15/2011   GERD 01/26/2010   Obstructive sleep apnea treated with BiPAP 02/03/2008   FIBROCYSTIC BREAST DISEASE 10/28/2006   Hyperlipidemia 03/27/2006   Obesity hypoventilation syndrome (HCC) 03/27/2006   Depression with anxiety 03/27/2006   Past Medical History:  Diagnosis Date   Alcoholism in remission (HCC)    11-26-2021  pt stated no alcohol few yrs ago    Asthma    Bipolar disorder (HCC)    Chronic diastolic CHF (congestive heart failure) (HCC) 2014   followed by dr b. lonni;   Chronic hypoxemic respiratory failure [facility])    pulmonologist--- dr brenna;   (11-26-2021 per pt was on supplemental oxygen  until she 01/ 2022 did not need it,  does not check O2 sats at home anymore.   CKD (chronic kidney disease), stage III [facility])    nephrologist--- dr dolan   COPD (chronic obstructive pulmonary disease) (HCC)    11-26-2021 pt trying to quit smoking ,  started age 28   Depression    Diabetes mellitus type 2, diet-controlled (HCC)    followed by pcp   (11-26-2021  per pt last A1c by pcp on 10-30-2021 7.8,  stated does not  check blood sugar)   Dyspnea    Fibrocystic breast disease    GAD (generalized anxiety disorder)    GERD (gastroesophageal reflux disease)    Hepatitis B surface antigen positive 11/2017   pt stated asymptomatic ,  no treatment   History of cervical dysplasia 04/10/2011   '93- once dx.-got pregnant-no intervention, then postpartum, no dysplasia found   History of condyloma acuminatum 04/09/2011   Hyperlipidemia    Hypertension    Incomplete left bundle branch block (LBBB)    Lung nodule 11/2017   pulmonologist--- dr brenna;  RUL and RLL   Muscle spasm of both lower legs    chronic , due to back issues,   OA (osteoarthritis) 04/10/2011   hips, shoulders, back   OSA (obstructive sleep apnea) 2009   study on epic 09-18-2007 mild osa w/ hypoxmia ;   (11-26-2021 per pt last used cpap 01/ 2022 before bariatric surgery 04/ 2022 stated lost wt / and stopped needing supplemental oxygen  , did not need anymore)   Polyneuropathy    due to back issues and DM   Polysubstance abuse (HCC)    11-26-2021  pt hx cocaine use stated none for yrs but ED visit 08-14-2021  positive UDS,  pt stated it was once only for depression none since   PSVT (paroxysmal supraventricular tachycardia)    followed by cardiology   Spinal stenosis,  unspecified spinal region    Urge incontinence of urine    Wears dentures    upper    Family History  Problem Relation Age of Onset   Pulmonary embolism Mother    Stroke Father    Alcohol abuse Father    Pneumonia Father    Hypertension Father    Breast cancer Maternal Aunt    Hypertension Other    Diabetes Other        Aunts and cousins   Asthma Sister    Depression Sister    Arthritis Sister    Sickle cell anemia Son     Past Surgical History:  Procedure Laterality Date   ANAL FISTULECTOMY  04/17/2011   Procedure: FISTULECTOMY ANAL;  Surgeon: Donnice POUR. Belinda, MD;  Location: [facility];  Service: General;  Laterality: N/A;  Excision of Condyloma Gluteal Cleft    ANAL RECTAL MANOMETRY N/A 03/26/2019   Procedure: ANO RECTAL MANOMETRY;  Surgeon: Eda Iha, MD;  Location: [facility];  Service: Gastroenterology;  Laterality: N/A;   BIOPSY  03/09/2019   Procedure: BIOPSY;  Surgeon: Eda Iha, MD;  Location: [facility];  Service: Gastroenterology;;   BOTOX  INJECTION N/A 12/29/2018   Procedure: BOTOX  INJECTION(100 UNITS)  WITH CYSTOSCOPY;  Surgeon: Devere Lonni Righter, MD;  Location: [facility];  Service: Urology;  Laterality: N/A;   BOTOX  INJECTION N/A 07/27/2019   Procedure: BOTOX  INJECTION WITH CYSTOSCOPY;  Surgeon: Devere Lonni Righter, MD;  Location: [facility];  Service: Urology;  Laterality: N/A;  ONLY NEEDS 30 MIN   BOTOX  INJECTION N/A 05/02/2021   Procedure: BOTOX  INJECTION WITH CYSTOSCOPY;  Surgeon: Devere Lonni Righter, MD;  Location[facility];  Service: Urology;  Laterality: N/A;  ONLY NEEDS 30 MIN   BOTOX  INJECTION N/A 11/28/2021   Procedure: BOTOX  INJECTION WITH CYSTOSCOPY, 200 UNITS;  Surgeon: Devere Lonni Righter, MD;  Location[facility];  Service: Urology;  Laterality: N/A;  ONLY NEEDS 30 MIN   BOTOX  INJECTION N/A 05/15/2022   Procedure: BOTOX  INJECTION 200 UNITS;  Surgeon: Devere Lonni Righter, MD;   Location: [facility]  ;  Service: Urology;  Laterality: N/A;   BREAST EXCISIONAL BIOPSY Right    x 3   BREAST EXCISIONAL BIOPSY Left    12-29-2000  and 11-30-2001 @[facility]  by dr curvin   BRONCHIAL BRUSHINGS  10/30/2023   Procedure: BRONCHOSCOPY, WITH BRUSH BIOPSY;  Surgeon: Zaida Zola SAILOR, MD;  Location: [facility];  Service: Pulmonary;;   BRONCHIAL NEEDLE ASPIRATION BIOPSY  10/30/2023   Procedure: BRONCHOSCOPY, WITH NEEDLE ASPIRATION BIOPSY;  Surgeon: Zaida Zola SAILOR, MD;  Location: [facility];  Service: Pulmonary;;   CARPAL TUNNEL RELEASE Bilateral    right 08-30-2002 and left 07-04-2003  by dr lucilla @ [facility]   CATARACT EXTRACTION W/ INTRAOCULAR LENS IMPLANT Bilateral 12/2020   CERVICAL FUSION  04/10/2011   x3- cervical fusion with plating and screws-Dr. Gust   COLONOSCOPY WITH PROPOFOL  N/A 07/06/2015   Procedure: COLONOSCOPY WITH PROPOFOL ;  Surgeon: Norleen Hint, MD;  Location: [facility];  Service: Endoscopy;  Laterality: N/A;   COLONOSCOPY WITH PROPOFOL  N/A 03/09/2019   Procedure: COLONOSCOPY WITH PROPOFOL ;  Surgeon: Eda Iha, MD;  Location: [facility];  Service: Gastroenterology;  Laterality: N/A;   CYSTOSCOPY N/A 05/15/2022   Procedure: CYSTOSCOPY;  Surgeon: Devere Lonni Righter, MD;  Location: [facility];  Service: Urology;  Laterality: N/A;   FRACTURE SURGERY     little toe left foot   I & D EXTREMITY Left 12/18/2016   Procedure: IRRIGATION AND DEBRIDEMENT LEFT FOOT, CLOSURE;  Surgeon: Jerri Kay HERO, MD;  Location: [facility];  Service: Orthopedics;  Laterality: Left;   INCISION AND DRAINAGE ABSCESS Right 05/03/2002   @[facility]  by dr curvin;   right breast abscess   INJECTION KNEE Right 10/03/2020   Procedure: KNEE INJECTION;  Surgeon: lucilla Lynwood BRAVO, MD;  [facility] OR;  Service: Orthopedics;  Laterality: Right;   KNEE ARTHROSCOPY WITH MEDIAL MENISECTOMY Right 03/21/2021   Procedure: RIGHT KNEE ARTHROSCOPY WITH MEDIAL AND PARTIAL LATERAL MENISCECTOMY;   Surgeon: Jerri Kay HERO, MD;  Location: [facility];  Service: Orthopedics;  Laterality: Right;   LAPAROSCOPIC GASTRIC SLEEVE RESECTION N/A 05/08/2020   Procedure: LAPAROSCOPIC GASTRIC SLEEVE RESECTION;  Surgeon: Stevie Herlene Righter, MD;  Location: [facility];  Service: General;  Laterality: N/A;   LUMBAR DISC SURGERY  04/10/2011   x5-Lumbar fusion-retained hardware.(Dr. lucilla)   LUMBAR LAMINECTOMY N/A 10/03/2020   Procedure: THORACOLUMBAR LAMINECTOMIES THORACIC ELEVEN-TWELVE, THORACIC TWELVE-LUMBAR ONE  AND LUMBAT ONE-TWO;  Surgeon: lucilla Lynwood BRAVO, MD;  Location: [facility];  Service: Orthopedics;  Laterality: N/A;   LUMBAR PERCUTANEOUS PEDICLE SCREW 2 LEVEL  06/29/2021   Procedure: LUMBAR PERCUTANEOUS PEDICLE SCREW LUMBAR ONE-TWO;  Surgeon: Louis Shove, MD;  Location: [facility];  Service: Neurosurgery;;   MULTIPLE TOOTH EXTRACTIONS     POLYPECTOMY  03/09/2019   Procedure: POLYPECTOMY;  Surgeon: Eda Iha, MD;  [facility]: [facility];  Service: Gastroenterology;;   POSTERIOR LUMBAR FUSION  04/04/2005   @[facility]  by dr lucilla;   L3-- L5   POSTERIOR LUMBAR FUSION  03/04/2006   @[facility]  dr lucilla;   fusion L2--3 and redo L3--5   SHOULDER ARTHROSCOPY WITH ROTATOR CUFF REPAIR AND SUBACROMIAL DECOMPRESSION Left 05/07/2019   Procedure: LEFT SHOULDER ARTHROSCOPY WITH DEBRIDEMENT, DISTAL CLAVICLE EXCISION,  SUBACROMIAL DECOMPRESSION AND POSSIBLE ROTATOR CUFF REPAIR;  Surgeon: Jerri Kay HERO, MD;  Location: [facility];  Service: Orthopedics;  Laterality: Left;   TEE WITHOUT CARDIOVERSION N/A 04/06/2012   Procedure: TRANSESOPHAGEAL ECHOCARDIOGRAM (TEE);  Surgeon: Vinie KYM Maxcy, MD;  Location: [facility];  Service:  Cardiovascular;  Laterality: N/A;   TRIGGER FINGER RELEASE Right    middle finger   TRIGGER FINGER RELEASE Left 06/28/2016   Procedure: RELEASE TRIGGER FINGER LEFT 3RD FINGER;  Surgeon: Jerri Kay HERO, MD;  [facility];  Service: Orthopedics;  Laterality: Left;   TRIGGER FINGER RELEASE Right  06/12/2016   Procedure: RIGHT INDEX FINGER TRIGGER RELEASE;  Surgeon: Jerri Kay HERO, MD;  Location: [facility];  Service: Orthopedics;  Laterality: Right;   TRIGGER FINGER RELEASE Right 01/19/2018   Procedure: RIGHT RING FINGER TRIGGER FINGER RELEASE;  Surgeon: Jerri Kay HERO, MD;  Location: [facility];  Service: Orthopedics;  Laterality: Right;   TRIGGER FINGER RELEASE Left 05/07/2019   Procedure: RELEASE TRIGGER FINGER LEFT INDEX FINGER;  Surgeon: Jerri Kay HERO, MD;  [facility]: [facility];  Service: Orthopedics;  Laterality: Left;   UPPER GI ENDOSCOPY N/A 05/08/2020   Procedure: UPPER GI ENDOSCOPY;  Surgeon: Stevie, Herlene Righter, MD;  Location: [facility];  Service: General;  Laterality: N/A;   VIDEO BRONCHOSCOPY WITH ENDOBRONCHIAL NAVIGATION N/A 10/30/2023   Procedure: VIDEO BRONCHOSCOPY WITH ENDOBRONCHIAL NAVIGATION;  Surgeon: Zaida Zola SAILOR, MD;  Location: [facility];  Service: Pulmonary;  Laterality: N/A;   Social History   Occupational History   Occupation: disabled    Comment: Disabled  Tobacco Use   Smoking status: Every Day    Current packs/day: 0.50    Average packs/day: 0.5 packs/day for 46.8 years (23.4 ttl pk-yrs)    Types: Cigarettes    Start date: 01/28/1977    Passive exposure: Past   Smokeless tobacco: Never  Vaping Use   Vaping status: Never Used  Substance and Sexual Activity   Alcohol use: Not Currently    Comment: hx alcoholism,  (11-26-2021 per pt no alcohol in a while)   Drug use: Not Currently    Comment: 11-26-2021  (hx cocaine use) positive UDS in epic 08-14-2021  pt stated only did it once due to depression 07/ 2023 none since;  per pt last marijuana yrs ago   Sexual activity: Not Currently    Partners: Male    Birth control/protection: Post-menopausal

## 2023-11-05 ENCOUNTER — Encounter: Admitting: Emergency Medicine

## 2023-11-06 ENCOUNTER — Other Ambulatory Visit

## 2023-11-06 ENCOUNTER — Ambulatory Visit (INDEPENDENT_AMBULATORY_CARE_PROVIDER_SITE_OTHER): Admitting: Orthopedic Surgery

## 2023-11-06 DIAGNOSIS — M545 Low back pain, unspecified: Secondary | ICD-10-CM

## 2023-11-06 DIAGNOSIS — G8929 Other chronic pain: Secondary | ICD-10-CM | POA: Diagnosis not present

## 2023-11-06 NOTE — Progress Notes (Signed)
 Called and spoke to pt - advised of results per Dr. Zaida. Pt verbalized understanding, NFN.

## 2023-11-06 NOTE — Progress Notes (Signed)
 Orthopedic Spine Surgery Office Note   Assessment: Patient is a 58 y.o. female with history of L1-S1 spinal fusion with significant back pain.  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  No radicular symptoms or myelopathic symptoms     Plan: -Patient has osteopenia so talked about vitamin D  and calcium  supplementation -Supine x-rays demonstrate a flexible kyphosis -Patient still has not quit smoking so told her no surgery would be offered at this time.  Encouraged cessation -Patient was interested in intramuscular steroid injection which was done today in the office.  See procedure note below -Patient has tried tylenol , ibuprofen, hydrocodone  -Will get CRP, ESR at our next visit. If she is nicotine  free, I will also order a nicotine  test as well -Patient should return to office in 6 weeks, x-rays at next visit: None    Intramuscular steroid injection note: After discussing the risk, benefits, alternatives of intramuscular steroid injection, patient elected to proceed.  Patient's left gluteus muscle was prepped with alcohol-based prep.  Ethyl chloride was used to anesthetize the skin.  80 mg of Depo-Medrol  was injected into the gluteus intramuscular space under standard sterile technique.  Needle was drawn and Band-Aid was applied.  Patient tolerated the procedure well. ___________________________________________________________________________     History:   Patient is a 58 y.o. female who presents today for follow-up on her lumbar spine.  Patient has not noticed any changes in her symptoms since she was last seen in the office.  She still having significant back pain.  She feels that in the mid and upper lumbar regions.  No pain radiating to either lower extremity.  She has not quit smoking yet.    Treatments tried: tylenol , ibuprofen, hydrocodone      Physical Exam:   General: no acute distress, appears stated age Neurologic: alert, answering questions appropriately, following  commands Respiratory: unlabored breathing on room air, symmetric chest rise Psychiatric: appropriate affect, normal cadence to speech     MSK (spine):   -Strength exam                                                   Left                  Right EHL                              5/5                  5/5 TA                                 5/5                  5/5 GSC                             5/5                  5/5 Knee extension            5/5                  5/5 Hip flexion  5/5                  5/5   -Sensory exam                           Sensation intact to light touch in L3-S1 nerve distributions of bilateral lower extremities   Imaging: XRs of the lumbar spine from 09/08/2023 were previously independently reviewed and interpreted, showing disc height loss and kyphosis above her prior L1-S1 fusion.  There are interbody devices at all former disc spaces from L1-S1.  Interbody's appear in appropriate position.  There is subtle lucency seen around the L1 screws. Patient's Glattes angle has changed from 3 degrees to 28 degrees over the course of a year.  Disc height loss at T12/L1.  No evidence of instability in flexion/extension views.   X-rays of the lumbar spine in the supine position from 11/06/2023 were independently reviewed and interpreted, showing improved alignment when compared to her standing films.  Kyphosis above the fusion is decreased in this position.  DEXA scan from 10/28/2023 showed osteopenia  CT scan of the thoracic spine from 09/24/2023 was independently reviewed and interpreted, showing large bridging osteophytes over the anterior aspect of the thoracic spine.  No lucency seen around the upper lumbar instrumentation or interbody devices.  Vacuum disc phenomenon in the T12/L1 disc space.   Patient name: [name] Patient MRN: [id number] Date of visit: 11/06/23

## 2023-11-10 ENCOUNTER — Ambulatory Visit: Admitting: Pharmacist

## 2023-11-11 ENCOUNTER — Encounter: Payer: Self-pay | Admitting: Family Medicine

## 2023-11-12 ENCOUNTER — Other Ambulatory Visit: Payer: Self-pay | Admitting: Family Medicine

## 2023-11-12 ENCOUNTER — Other Ambulatory Visit

## 2023-11-13 ENCOUNTER — Telehealth: Payer: Self-pay | Admitting: Pharmacist

## 2023-11-13 ENCOUNTER — Ambulatory Visit

## 2023-11-13 DIAGNOSIS — G894 Chronic pain syndrome: Secondary | ICD-10-CM

## 2023-11-13 DIAGNOSIS — E1142 Type 2 diabetes mellitus with diabetic polyneuropathy: Secondary | ICD-10-CM

## 2023-11-13 MED ORDER — GABAPENTIN 300 MG PO CAPS
300.0000 mg | ORAL_CAPSULE | Freq: Three times a day (TID) | ORAL | 2 refills | Status: DC
Start: 2023-11-13 — End: 2023-11-23

## 2023-11-13 NOTE — Telephone Encounter (Addendum)
 Patient contacted for follow-up of missed appointment on   Since last contact patient reports running out of gabapentin  and experiencing withdrawal symptoms.   Pt has been smoking cigarettes but expresses enthusiasm in rescheduling an appointment to address this. Pt reports around 10 cigarettes per day.   New appt scheduled for 10/28 at 1:30pm.  Discussed Gabapentin  use with Dr. Delores.  Refilled with 2 additional refills   Total time with patient call and documentation of interaction: 9 minutes.

## 2023-11-14 MED ORDER — GABAPENTIN 300 MG PO CAPS: 300.0000 mg | ORAL_CAPSULE | Freq: Three times a day (TID) | ORAL | 3 refills | Status: DC

## 2023-11-14 NOTE — Telephone Encounter (Signed)
 Reviewed and agree with Dr Rennis plan.

## 2023-11-17 ENCOUNTER — Encounter: Payer: Self-pay | Admitting: Family Medicine

## 2023-11-17 ENCOUNTER — Other Ambulatory Visit: Payer: Self-pay | Admitting: Family Medicine

## 2023-11-19 ENCOUNTER — Other Ambulatory Visit

## 2023-11-20 ENCOUNTER — Ambulatory Visit: Admitting: Orthopaedic Surgery

## 2023-11-21 ENCOUNTER — Emergency Department (HOSPITAL_COMMUNITY)

## 2023-11-21 ENCOUNTER — Inpatient Hospital Stay (HOSPITAL_COMMUNITY)
Admission: EM | Admit: 2023-11-21 | Discharge: 2023-11-24 | DRG: 871 | Disposition: A | Discharge status: Home Health | Attending: Internal Medicine | Admitting: Internal Medicine

## 2023-11-21 ENCOUNTER — Inpatient Hospital Stay (HOSPITAL_COMMUNITY)

## 2023-11-21 DIAGNOSIS — N179 Acute kidney failure, unspecified: Secondary | ICD-10-CM | POA: Insufficient documentation

## 2023-11-21 DIAGNOSIS — K219 Gastro-esophageal reflux disease without esophagitis: Secondary | ICD-10-CM | POA: Diagnosis present

## 2023-11-21 DIAGNOSIS — Z881 Allergy status to other antibiotic agents status: Secondary | ICD-10-CM

## 2023-11-21 DIAGNOSIS — R918 Other nonspecific abnormal finding of lung field: Secondary | ICD-10-CM | POA: Diagnosis not present

## 2023-11-21 DIAGNOSIS — R410 Disorientation, unspecified: Secondary | ICD-10-CM | POA: Diagnosis not present

## 2023-11-21 DIAGNOSIS — Z794 Long term (current) use of insulin: Secondary | ICD-10-CM

## 2023-11-21 DIAGNOSIS — E1142 Type 2 diabetes mellitus with diabetic polyneuropathy: Secondary | ICD-10-CM | POA: Diagnosis present

## 2023-11-21 DIAGNOSIS — Z833 Family history of diabetes mellitus: Secondary | ICD-10-CM

## 2023-11-21 DIAGNOSIS — R1310 Dysphagia, unspecified: Secondary | ICD-10-CM | POA: Diagnosis present

## 2023-11-21 DIAGNOSIS — D751 Secondary polycythemia: Secondary | ICD-10-CM | POA: Diagnosis present

## 2023-11-21 DIAGNOSIS — I4891 Unspecified atrial fibrillation: Secondary | ICD-10-CM | POA: Diagnosis present

## 2023-11-21 DIAGNOSIS — M170 Bilateral primary osteoarthritis of knee: Secondary | ICD-10-CM | POA: Diagnosis present

## 2023-11-21 DIAGNOSIS — E875 Hyperkalemia: Secondary | ICD-10-CM | POA: Diagnosis not present

## 2023-11-21 DIAGNOSIS — R0602 Shortness of breath: Secondary | ICD-10-CM | POA: Diagnosis not present

## 2023-11-21 DIAGNOSIS — L8990 Pressure ulcer of unspecified site, unspecified stage: Secondary | ICD-10-CM | POA: Insufficient documentation

## 2023-11-21 DIAGNOSIS — Z87410 Personal history of cervical dysplasia: Secondary | ICD-10-CM

## 2023-11-21 DIAGNOSIS — E1310 Other specified diabetes mellitus with ketoacidosis without coma: Secondary | ICD-10-CM | POA: Diagnosis not present

## 2023-11-21 DIAGNOSIS — Z8249 Family history of ischemic heart disease and other diseases of the circulatory system: Secondary | ICD-10-CM

## 2023-11-21 DIAGNOSIS — Z803 Family history of malignant neoplasm of breast: Secondary | ICD-10-CM

## 2023-11-21 DIAGNOSIS — L89152 Pressure ulcer of sacral region, stage 2: Secondary | ICD-10-CM | POA: Diagnosis present

## 2023-11-21 DIAGNOSIS — R4182 Altered mental status, unspecified: Secondary | ICD-10-CM

## 2023-11-21 DIAGNOSIS — E1122 Type 2 diabetes mellitus with diabetic chronic kidney disease: Secondary | ICD-10-CM | POA: Diagnosis present

## 2023-11-21 DIAGNOSIS — A419 Sepsis, unspecified organism: Principal | ICD-10-CM

## 2023-11-21 DIAGNOSIS — Z1152 Encounter for screening for COVID-19: Secondary | ICD-10-CM

## 2023-11-21 DIAGNOSIS — I5032 Chronic diastolic (congestive) heart failure: Secondary | ICD-10-CM | POA: Diagnosis present

## 2023-11-21 DIAGNOSIS — Z7984 Long term (current) use of oral hypoglycemic drugs: Secondary | ICD-10-CM

## 2023-11-21 DIAGNOSIS — Z823 Family history of stroke: Secondary | ICD-10-CM

## 2023-11-21 DIAGNOSIS — F1021 Alcohol dependence, in remission: Secondary | ICD-10-CM | POA: Diagnosis present

## 2023-11-21 DIAGNOSIS — E1110 Type 2 diabetes mellitus with ketoacidosis without coma: Secondary | ICD-10-CM | POA: Diagnosis not present

## 2023-11-21 DIAGNOSIS — F319 Bipolar disorder, unspecified: Secondary | ICD-10-CM | POA: Diagnosis present

## 2023-11-21 DIAGNOSIS — N184 Chronic kidney disease, stage 4 (severe): Secondary | ICD-10-CM | POA: Diagnosis present

## 2023-11-21 DIAGNOSIS — E785 Hyperlipidemia, unspecified: Secondary | ICD-10-CM | POA: Diagnosis present

## 2023-11-21 DIAGNOSIS — Z886 Allergy status to analgesic agent status: Secondary | ICD-10-CM

## 2023-11-21 DIAGNOSIS — I130 Hypertensive heart and chronic kidney disease with heart failure and stage 1 through stage 4 chronic kidney disease, or unspecified chronic kidney disease: Secondary | ICD-10-CM | POA: Diagnosis present

## 2023-11-21 DIAGNOSIS — G4733 Obstructive sleep apnea (adult) (pediatric): Secondary | ICD-10-CM | POA: Diagnosis present

## 2023-11-21 DIAGNOSIS — Z832 Family history of diseases of the blood and blood-forming organs and certain disorders involving the immune mechanism: Secondary | ICD-10-CM

## 2023-11-21 DIAGNOSIS — Z818 Family history of other mental and behavioral disorders: Secondary | ICD-10-CM

## 2023-11-21 DIAGNOSIS — F411 Generalized anxiety disorder: Secondary | ICD-10-CM | POA: Diagnosis present

## 2023-11-21 DIAGNOSIS — N390 Urinary tract infection, site not specified: Secondary | ICD-10-CM | POA: Diagnosis present

## 2023-11-21 DIAGNOSIS — L02224 Furuncle of groin: Secondary | ICD-10-CM | POA: Diagnosis present

## 2023-11-21 DIAGNOSIS — N170 Acute kidney failure with tubular necrosis: Secondary | ICD-10-CM | POA: Diagnosis present

## 2023-11-21 DIAGNOSIS — R6520 Severe sepsis without septic shock: Secondary | ICD-10-CM | POA: Diagnosis present

## 2023-11-21 DIAGNOSIS — Z811 Family history of alcohol abuse and dependence: Secondary | ICD-10-CM

## 2023-11-21 DIAGNOSIS — I493 Ventricular premature depolarization: Secondary | ICD-10-CM | POA: Diagnosis present

## 2023-11-21 DIAGNOSIS — I517 Cardiomegaly: Secondary | ICD-10-CM | POA: Diagnosis not present

## 2023-11-21 DIAGNOSIS — J9611 Chronic respiratory failure with hypoxia: Secondary | ICD-10-CM | POA: Diagnosis present

## 2023-11-21 DIAGNOSIS — E1011 Type 1 diabetes mellitus with ketoacidosis with coma: Secondary | ICD-10-CM | POA: Diagnosis not present

## 2023-11-21 DIAGNOSIS — B961 Klebsiella pneumoniae [K. pneumoniae] as the cause of diseases classified elsewhere: Secondary | ICD-10-CM | POA: Diagnosis present

## 2023-11-21 DIAGNOSIS — Z79899 Other long term (current) drug therapy: Secondary | ICD-10-CM

## 2023-11-21 DIAGNOSIS — Z7951 Long term (current) use of inhaled steroids: Secondary | ICD-10-CM

## 2023-11-21 DIAGNOSIS — N183 Chronic kidney disease, stage 3 unspecified: Secondary | ICD-10-CM

## 2023-11-21 DIAGNOSIS — R531 Weakness: Secondary | ICD-10-CM | POA: Diagnosis not present

## 2023-11-21 DIAGNOSIS — J4489 Other specified chronic obstructive pulmonary disease: Secondary | ICD-10-CM | POA: Diagnosis present

## 2023-11-21 DIAGNOSIS — I10 Essential (primary) hypertension: Secondary | ICD-10-CM | POA: Diagnosis present

## 2023-11-21 DIAGNOSIS — R0989 Other specified symptoms and signs involving the circulatory and respiratory systems: Secondary | ICD-10-CM | POA: Diagnosis not present

## 2023-11-21 DIAGNOSIS — G9341 Metabolic encephalopathy: Secondary | ICD-10-CM | POA: Diagnosis present

## 2023-11-21 DIAGNOSIS — F17210 Nicotine dependence, cigarettes, uncomplicated: Secondary | ICD-10-CM | POA: Diagnosis present

## 2023-11-21 DIAGNOSIS — Z825 Family history of asthma and other chronic lower respiratory diseases: Secondary | ICD-10-CM

## 2023-11-21 DIAGNOSIS — R319 Hematuria, unspecified: Secondary | ICD-10-CM | POA: Diagnosis not present

## 2023-11-21 DIAGNOSIS — Z8261 Family history of arthritis: Secondary | ICD-10-CM

## 2023-11-21 DIAGNOSIS — R32 Unspecified urinary incontinence: Secondary | ICD-10-CM | POA: Diagnosis present

## 2023-11-21 DIAGNOSIS — I6782 Cerebral ischemia: Secondary | ICD-10-CM | POA: Diagnosis not present

## 2023-11-21 LAB — BASIC METABOLIC PANEL WITH GFR
Anion gap: 22 — ABNORMAL HIGH (ref 5–15)
Anion gap: 16 — ABNORMAL HIGH (ref 5–15)
BUN: 39 mg/dL — ABNORMAL HIGH (ref 6–20)
BUN: 39 mg/dL — ABNORMAL HIGH (ref 6–20)
CO2: 13 mmol/L — ABNORMAL LOW (ref 22–32)
CO2: 20 mmol/L — ABNORMAL LOW (ref 22–32)
Calcium: 9.6 mg/dL (ref 8.9–10.3)
Calcium: 9.6 mg/dL (ref 8.9–10.3)
Chloride: 105 mmol/L (ref 98–111)
Chloride: 106 mmol/L (ref 98–111)
Creatinine, Ser: 1.35 mg/dL — ABNORMAL HIGH (ref 0.44–1.00)
Creatinine, Ser: 1.28 mg/dL — ABNORMAL HIGH (ref 0.44–1.00)
GFR, Estimated: 45 mL/min — ABNORMAL LOW (ref >60)
GFR, Estimated: 48 mL/min — ABNORMAL LOW (ref >60)
Glucose, Bld: 251 mg/dL — ABNORMAL HIGH (ref 70–99)
Glucose, Bld: 233 mg/dL — ABNORMAL HIGH (ref 70–99)
Potassium: 5 mmol/L (ref 3.5–5.1)
Potassium: 5.2 mmol/L — ABNORMAL HIGH (ref 3.5–5.1)
Sodium: 141 mmol/L (ref 135–145)
Sodium: 142 mmol/L (ref 135–145)

## 2023-11-21 LAB — COMPREHENSIVE METABOLIC PANEL WITH GFR
ALT: 14 U/L (ref 0–44)
AST: 10 U/L — ABNORMAL LOW (ref 15–41)
Albumin: 3.7 g/dL (ref 3.5–5.0)
Alkaline Phosphatase: 172 U/L — ABNORMAL HIGH (ref 38–126)
Anion gap: 29 — ABNORMAL HIGH (ref 5–15)
BUN: 42 mg/dL — ABNORMAL HIGH (ref 6–20)
CO2: 10 mmol/L — ABNORMAL LOW (ref 22–32)
Calcium: 9.9 mg/dL (ref 8.9–10.3)
Chloride: 98 mmol/L (ref 98–111)
Creatinine, Ser: 1.52 mg/dL — ABNORMAL HIGH (ref 0.44–1.00)
GFR, Estimated: 39 mL/min — ABNORMAL LOW (ref >60)
Glucose, Bld: 468 mg/dL — ABNORMAL HIGH (ref 70–99)
Potassium: 6.4 mmol/L (ref 3.5–5.1)
Sodium: 137 mmol/L (ref 135–145)
Total Bilirubin: 0.3 mg/dL (ref 0.0–1.2)
Total Protein: 7.8 g/dL (ref 6.5–8.1)

## 2023-11-21 LAB — BLOOD GAS, VENOUS
Acid-base deficit: 15.2 mmol/L — ABNORMAL HIGH (ref 0.0–2.0)
Bicarbonate: 11.2 mmol/L — ABNORMAL LOW (ref 20.0–28.0)
O2 Saturation: 65.5 %
Patient temperature: 37
pCO2, Ven: 28 mmHg — ABNORMAL LOW (ref 44–60)
pH, Ven: 7.21 — ABNORMAL LOW (ref 7.25–7.43)
pO2, Ven: 41 mmHg (ref 32–45)

## 2023-11-21 LAB — CBC WITH DIFFERENTIAL/PLATELET
Abs Immature Granulocytes: 0.66 K/uL — ABNORMAL HIGH (ref 0.00–0.07)
Basophils Absolute: 0.2 K/uL — ABNORMAL HIGH (ref 0.0–0.1)
Basophils Relative: 1 %
Eosinophils Absolute: 0 K/uL (ref 0.0–0.5)
Eosinophils Relative: 0 %
HCT: 55 % — ABNORMAL HIGH (ref 36.0–46.0)
Hemoglobin: 18.4 g/dL — ABNORMAL HIGH (ref 12.0–15.0)
Immature Granulocytes: 3 %
Lymphocytes Relative: 11 %
Lymphs Abs: 2.7 K/uL (ref 0.7–4.0)
MCH: 28.5 pg (ref 26.0–34.0)
MCHC: 33.5 g/dL (ref 30.0–36.0)
MCV: 85.3 fL (ref 80.0–100.0)
Monocytes Absolute: 1.4 K/uL — ABNORMAL HIGH (ref 0.1–1.0)
Monocytes Relative: 6 %
Neutro Abs: 18.9 K/uL — ABNORMAL HIGH (ref 1.7–7.7)
Neutrophils Relative %: 79 %
Platelets: 371 K/uL (ref 150–400)
RBC: 6.45 MIL/uL — ABNORMAL HIGH (ref 3.87–5.11)
RDW: 13.2 % (ref 11.5–15.5)
Smear Review: NORMAL
WBC: 23.8 K/uL — ABNORMAL HIGH (ref 4.0–10.5)
nRBC: 0 % (ref 0.0–0.2)

## 2023-11-21 LAB — TSH: TSH: <0.1 u[IU]/mL — ABNORMAL LOW (ref 0.350–4.500)

## 2023-11-21 LAB — MAGNESIUM: Magnesium: 3.2 mg/dL — ABNORMAL HIGH (ref 1.7–2.4)

## 2023-11-21 LAB — GLUCOSE, CAPILLARY
Glucose-Capillary: 186 mg/dL — ABNORMAL HIGH (ref 70–99)
Glucose-Capillary: 207 mg/dL — ABNORMAL HIGH (ref 70–99)
Glucose-Capillary: 245 mg/dL — ABNORMAL HIGH (ref 70–99)
Glucose-Capillary: 246 mg/dL — ABNORMAL HIGH (ref 70–99)
Glucose-Capillary: 260 mg/dL — ABNORMAL HIGH (ref 70–99)
Glucose-Capillary: 267 mg/dL — ABNORMAL HIGH (ref 70–99)

## 2023-11-21 LAB — BETA-HYDROXYBUTYRIC ACID
Beta-Hydroxybutyric Acid: >8 mmol/L — ABNORMAL HIGH (ref 0.05–0.27)
Beta-Hydroxybutyric Acid: 4.44 mmol/L — ABNORMAL HIGH (ref 0.05–0.27)

## 2023-11-21 LAB — GROUP A STREP BY PCR: Group A Strep by PCR: NOT DETECTED

## 2023-11-21 LAB — I-STAT CHEM 8, ED
BUN: 46 mg/dL — ABNORMAL HIGH (ref 6–20)
Calcium, Ion: 1.12 mmol/L — ABNORMAL LOW (ref 1.15–1.40)
Chloride: 108 mmol/L (ref 98–111)
Creatinine, Ser: 1.2 mg/dL — ABNORMAL HIGH (ref 0.44–1.00)
Glucose, Bld: 454 mg/dL — ABNORMAL HIGH (ref 70–99)
HCT: 50 % — ABNORMAL HIGH (ref 36.0–46.0)
Hemoglobin: 17 g/dL — ABNORMAL HIGH (ref 12.0–15.0)
Potassium: 6.4 mmol/L (ref 3.5–5.1)
Sodium: 135 mmol/L (ref 135–145)
TCO2: 12 mmol/L — ABNORMAL LOW (ref 22–32)

## 2023-11-21 LAB — URINALYSIS, ROUTINE W REFLEX MICROSCOPIC
Bilirubin Urine: NEGATIVE
Glucose, UA: >=500 mg/dL — AB
Ketones, ur: 20 mg/dL — AB
Nitrite: POSITIVE — AB
Protein, ur: 30 mg/dL — AB
RBC / HPF: >50 RBC/hpf (ref 0–5)
Specific Gravity, Urine: 1.023 (ref 1.005–1.030)
WBC, UA: >50 WBC/hpf (ref 0–5)
pH: 5 (ref 5.0–8.0)

## 2023-11-21 LAB — RESP PANEL BY RT-PCR (RSV, FLU A&B, COVID)  RVPGX2
Influenza A by PCR: NEGATIVE
Influenza B by PCR: NEGATIVE
Resp Syncytial Virus by PCR: NEGATIVE
SARS Coronavirus 2 by RT PCR: NEGATIVE

## 2023-11-21 LAB — MRSA NEXT GEN BY PCR, NASAL: MRSA by PCR Next Gen: NOT DETECTED

## 2023-11-21 LAB — I-STAT CG4 LACTIC ACID, ED
Lactic Acid, Venous: 1.9 mmol/L (ref 0.5–1.9)
Lactic Acid, Venous: 1.5 mmol/L (ref 0.5–1.9)

## 2023-11-21 LAB — CBG MONITORING, ED
Glucose-Capillary: 457 mg/dL — ABNORMAL HIGH (ref 70–99)
Glucose-Capillary: 430 mg/dL — ABNORMAL HIGH (ref 70–99)
Glucose-Capillary: 373 mg/dL — ABNORMAL HIGH (ref 70–99)

## 2023-11-21 LAB — AMMONIA: Ammonia: 26 umol/L (ref 9–35)

## 2023-11-21 MED ORDER — DEXTROSE IN LACTATED RINGERS 5 % IV SOLN: INTRAVENOUS | Status: DC

## 2023-11-21 MED ORDER — CHLORHEXIDINE GLUCONATE CLOTH 2 % EX PADS
6.0000 | MEDICATED_PAD | Freq: Every day | CUTANEOUS | Status: DC
  Administered 2023-11-21 – 2023-11-23 (×3): 6 via TOPICAL

## 2023-11-21 MED ORDER — ACETAMINOPHEN 325 MG PO TABS: 650.0000 mg | ORAL_TABLET | Freq: Four times a day (QID) | ORAL | Status: DC | PRN

## 2023-11-21 MED ORDER — ONDANSETRON HCL 4 MG/2ML IJ SOLN: 4.0000 mg | Freq: Four times a day (QID) | INTRAMUSCULAR | Status: DC | PRN

## 2023-11-21 MED ORDER — TRAZODONE HCL 50 MG PO TABS
25.0000 mg | ORAL_TABLET | Freq: Every evening | ORAL | Status: DC | PRN
  Administered 2023-11-22: 25 mg via ORAL
  Filled 2023-11-21: qty 1

## 2023-11-21 MED ORDER — VANCOMYCIN HCL 2000 MG/400ML IV SOLN
2000.0000 mg | Freq: Once | INTRAVENOUS | Status: AC
Start: 2023-11-21 — End: 2023-11-21
  Administered 2023-11-21: 2000 mg via INTRAVENOUS
  Filled 2023-11-21: qty 400

## 2023-11-21 MED ORDER — SODIUM ZIRCONIUM CYCLOSILICATE 10 G PO PACK
10.0000 g | PACK | Freq: Once | ORAL | Status: DC
  Filled 2023-11-21: qty 1

## 2023-11-21 MED ORDER — SODIUM BICARBONATE 8.4 % IV SOLN
50.0000 meq | Freq: Once | INTRAVENOUS | Status: AC
  Administered 2023-11-21: 50 meq via INTRAVENOUS
  Filled 2023-11-21: qty 50

## 2023-11-21 MED ORDER — DILTIAZEM HCL 25 MG/5ML IV SOLN
15.0000 mg | Freq: Once | INTRAVENOUS | Status: DC
Start: 2023-11-21 — End: 2023-11-23

## 2023-11-21 MED ORDER — DEXTROSE 50 % IV SOLN: 0.0000 mL | INTRAVENOUS | Status: DC | PRN

## 2023-11-21 MED ORDER — ORAL CARE MOUTH RINSE
15.0000 mL | OROMUCOSAL | Status: DC | PRN
  Administered 2023-11-21 – 2023-11-22 (×2): 15 mL via OROMUCOSAL

## 2023-11-21 MED ORDER — ONDANSETRON HCL 4 MG PO TABS: 4.0000 mg | ORAL_TABLET | Freq: Four times a day (QID) | ORAL | Status: DC | PRN

## 2023-11-21 MED ORDER — SODIUM CHLORIDE 0.9 % IV SOLN
1.0000 g | INTRAVENOUS | Status: DC
  Administered 2023-11-22 – 2023-11-23 (×2): 1 g via INTRAVENOUS
  Filled 2023-11-21 (×3): qty 10

## 2023-11-21 MED ORDER — DILTIAZEM HCL-DEXTROSE 125-5 MG/125ML-% IV SOLN (PREMIX)
5.0000 mg/h | INTRAVENOUS | Status: DC
  Administered 2023-11-21: 5 mg/h via INTRAVENOUS
  Administered 2023-11-22 – 2023-11-23 (×4): 15 mg/h via INTRAVENOUS
  Filled 2023-11-21 (×5): qty 125

## 2023-11-21 MED ORDER — INSULIN REGULAR(HUMAN) IN NACL 100-0.9 UT/100ML-% IV SOLN
INTRAVENOUS | Status: DC
  Administered 2023-11-21: 13 [IU]/h via INTRAVENOUS
  Administered 2023-11-22: 4 [IU]/h via INTRAVENOUS
  Filled 2023-11-21 (×2): qty 100

## 2023-11-21 MED ORDER — LACTATED RINGERS IV SOLN: INTRAVENOUS | Status: DC

## 2023-11-21 MED ORDER — SODIUM CHLORIDE 0.9 % IV SOLN
2.0000 g | Freq: Once | INTRAVENOUS | Status: AC
  Administered 2023-11-21: 2 g via INTRAVENOUS
  Filled 2023-11-21: qty 20

## 2023-11-21 MED ORDER — LACTATED RINGERS IV BOLUS
1000.0000 mL | Freq: Once | INTRAVENOUS | Status: AC
  Administered 2023-11-21: 1000 mL via INTRAVENOUS

## 2023-11-21 MED ORDER — ACETAMINOPHEN 650 MG RE SUPP: 650.0000 mg | Freq: Four times a day (QID) | RECTAL | Status: DC | PRN

## 2023-11-21 MED ORDER — ALBUTEROL SULFATE (2.5 MG/3ML) 0.083% IN NEBU: 2.5000 mg | INHALATION_SOLUTION | RESPIRATORY_TRACT | Status: DC | PRN

## 2023-11-21 MED ORDER — HEPARIN SODIUM (PORCINE) 5000 UNIT/ML IJ SOLN
5000.0000 [IU] | Freq: Three times a day (TID) | INTRAMUSCULAR | Status: DC
  Administered 2023-11-21 – 2023-11-23 (×6): 5000 [IU] via SUBCUTANEOUS
  Filled 2023-11-21 (×6): qty 1

## 2023-11-21 MED ORDER — VANCOMYCIN HCL IN DEXTROSE 1-5 GM/200ML-% IV SOLN: 1000.0000 mg | INTRAVENOUS | Status: DC

## 2023-11-21 MED ORDER — ALBUTEROL SULFATE (2.5 MG/3ML) 0.083% IN NEBU
10.0000 mg | INHALATION_SOLUTION | Freq: Once | RESPIRATORY_TRACT | Status: AC
  Administered 2023-11-21: 10 mg via RESPIRATORY_TRACT
  Filled 2023-11-21: qty 12

## 2023-11-21 MED ORDER — CALCIUM GLUCONATE-NACL 1-0.675 GM/50ML-% IV SOLN
1.0000 g | Freq: Once | INTRAVENOUS | Status: AC
  Administered 2023-11-21: 1000 mg via INTRAVENOUS
  Filled 2023-11-21: qty 50

## 2023-11-21 NOTE — Progress Notes (Signed)
 Alerted that there is an openly draining abscess on groin, purulent material present. Vancomycin  ordered.

## 2023-11-21 NOTE — ED Provider Notes (Signed)
 [facility] EMERG[facility]Y DEPARTMENT AT [facility] Provider Note   CSN: [id number] Arrival date & time: 11/21/23  9061     Patient presents with: Altered Mental Status   [name] is a 58 y.o. female.   HPI   58 year old female presents emergency department with altered mental status.  Brought in by her son.  Her son came over today to take her to an appointment and found her in bed, soiled, altered.  Last time the son saw her was 5 days ago, he talked to her on the phone about 3 days ago and she sounded normal.  He states she usually gets around with a rollator and can care for herself.  This is a drastic change from her baseline.  There has been no known fall or trauma.  Unknown if she is recently had fever or other changes.  The son did find all of her medicine at bedside so it appears that she has not taken her medication in a couple days.  Prior to Admission medications   Medication Sig Start Date End Date Taking? Authorizing Provider  acetaminophen  (TYLENOL ) 500 MG tablet Take 1,000 mg by mouth 2 (two) times daily.    [provider]  albuterol  (VENTOLIN  HFA) 108 (90 Base) MCG/ACT inhaler INHALE TWO PUFFS INTO THE LUNGS EVERY SIX HOURS AS NEEDED WHEEZING OR For SHORTNESS OF BREATH 09/11/23   Elicia Hamlet, MD  ammonium lactate  (LAC-HYDRIN ) 12 % lotion For dry skin 05/20/23   Rosendo Rush, MD  atorvastatin  (LIPITOR) 40 MG tablet Take 1 tablet (40 mg total) by mouth daily. 06/04/23   Rosendo Rush, MD  baclofen  (LIORESAL ) 10 MG tablet TAKE ONE TABLET (10mg  total) BY MOUTH THREE TIMES DAILY AS NEEDED muscle SPASMS 11/17/23   Elicia Hamlet, MD  busPIRone  (BUSPAR ) 10 MG tablet Take 1 tablet (10 mg total) by mouth 3 (three) times daily. 06/18/23   Rosendo Rush, MD  carvedilol  (COREG ) 12.5 MG tablet TAKE ONE TABLET (12.5MG  TOTAL) BY MOUTH TWICE DAILY 11/03/23   Elicia Hamlet, MD  cloNIDine  (CATAPRES ) 0.2 MG tablet Take 1 tablet (0.2 mg total) by mouth 2 (two) times daily.  09/13/22   Orlando Pond, DO  diclofenac  Sodium (VOLTAREN ) 1 % GEL APPLY FOUR grams topically FOUR TIMES DAILY 07/31/23   Elicia Hamlet, MD  DILT-XR 120 MG 24 hr capsule TAKE ONE CAPSULE BY MOUTH TWICE DAILY 09/09/23   Elicia Hamlet, MD  empagliflozin  (JARDIANCE ) 25 MG TABS tablet Take 1 tablet (25 mg total) by mouth daily before breakfast. 10/29/23   Elicia Hamlet, MD  fluticasone  (FLONASE ) 50 MCG/ACT nasal spray instill TWO SPRAYS IN EACH NOSTRIL TWICE DAILY 08/19/23   Elicia Hamlet, MD  furosemide  (LASIX ) 80 MG tablet TAKE ONE TABLET BY MOUTH TWICE DAILY relaTED TO SYSTEMIC (congestive heart failure) 11/14/23   Elicia Hamlet, MD  gabapentin  (NEURONTIN ) 300 MG capsule Take 1 capsule (300 mg total) by mouth 3 (three) times daily. 11/13/23   McDiarmid, Krystal BIRCH, MD  gabapentin  (NEURONTIN ) 300 MG capsule Take 1 capsule (300 mg total) by mouth 3 (three) times daily. 11/14/23   Elicia Hamlet, MD  ketoconazole  (NIZORAL ) 2 % cream APPLY TO THE AFFECTED AREA(S) EVERY DAY 07/15/23   Loreda Hacker, DPM  loratadine  (CLARITIN ) 10 MG tablet TAKE ONE TABLET BY MOUTH EVERY DAY FOR allergies 02/12/23   Rosendo Rush, MD  losartan  (COZAAR ) 100 MG tablet Take 1 tablet (100 mg total) by mouth daily. 03/07/23   Rosendo Rush, MD  montelukast  (SINGULAIR )  10 MG tablet TAKE ONE TABLET BY MOUTH AT BEDTIME 10/17/23   Elicia Hamlet, MD  Multiple Vitamins-Minerals (MULTIVITAMIN ADULTS 50+) TABS Take 1 tablet by mouth daily.    [provider]  mupirocin  ointment (BACTROBAN ) 2 % Apply 1 Application topically 2 (two) times daily. To right elbow 10/29/23   Elicia Hamlet, MD  NARCAN  4 MG/0.1ML LIQD nasal spray kit Place 0.4 mg into the nose once as needed (accidental overdose). 02/16/18   [provider]  nystatin  (MYCOSTATIN ) 100000 UNIT/ML suspension take 5ml (500,000 units total) BY MOUTH FOUR TIMES DAILY 10/24/23   Elicia Hamlet, MD  omeprazole  (PRILOSEC) 20 MG capsule Take 1 capsule (20 mg total) by mouth daily. 06/09/23    Rosendo Rush, MD  potassium chloride  SA (KLOR-CON  M) 20 MEQ tablet TAKE ONE TABLET ( TOTAL) BY MOUTH EVERY DAY 11/03/23   Elicia Hamlet, MD  sodium chloride  (OCEAN) 0.65 % SOLN nasal spray Place 1 spray into both nostrils as needed for congestion. 10/17/23   Elicia Hamlet, MD  spironolactone  (ALDACTONE ) 25 MG tablet Take 0.5 tablets (12.5 mg total) by mouth daily. 08/19/23   Elicia Hamlet, MD  TRELEGY ELLIPTA  100-62.5-25 MCG/ACT AEPB INHALE ONE PUFF INTO THE LUNGS EVERY DAY RELATED TO ACUTE AND CHRONIC RESPIRATOPR FAILURE 09/11/23   Elicia Hamlet, MD  venlafaxine  XR (EFFEXOR -XR) 75 MG 24 hr capsule Take 1 capsule (75 mg total) by mouth daily with breakfast. 07/15/23   Rosendo Rush, MD    Allergies: Dorethia Constant ], Methadose [methadone hcl], and Levaquin [levofloxacin]    Review of Systems  Unable to perform ROS: Mental status change    Updated Vital Signs BP (!) 156/75 (BP Location: Right Arm)   Pulse 97   Temp (!) 97.4 F (36.3 C) (Oral)   Resp 16   LMP 01/29/1992   SpO2 99%   Physical Exam Vitals and nursing note reviewed.  Constitutional:      Comments: Sleeping, moans to name but does not interact  HENT:     Head: Normocephalic.     Mouth/Throat:     Mouth: Mucous membranes are dry.  Eyes:     Pupils: Pupils are equal, round, and reactive to light.  Cardiovascular:     Rate and Rhythm: Normal rate.  Pulmonary:     Effort: No respiratory distress.     Breath sounds: Rhonchi present.  Abdominal:     Palpations: Abdomen is soft.  Musculoskeletal:        General: No deformity or signs of injury.  Skin:    General: Skin is warm.  Neurological:     Comments: Moans and opens eyes to name but otherwise does not interact or follow commands     (all labs ordered are listed, but only abnormal results are displayed) Labs Reviewed  CBC WITH DIFFERENTIAL/PLATELET  COMPREHENSIVE METABOLIC PANEL WITH GFR  BLOOD GAS, VENOUS  MAGNESIUM   URINALYSIS, ROUTINE W REFLEX MICROSCOPIC   AMMONIA  I-STAT CG4 LACTIC ACID , ED    EKG: None  Radiology: No results found.   .Critical Care  Performed by: Bari Roxie HERO, DO Authorized by: Bari Roxie HERO, DO   Critical care provider statement:    Critical care time (minutes):  75   Critical care time was exclusive of:  Separately billable procedures and treating other patients   Critical care was necessary to treat or prevent imminent or life-threatening deterioration of the following conditions:  Dehydration, sepsis, metabolic crisis, endocrine crisis and circulatory failure   Critical care was  time spent personally by me on the following activities:  Development of treatment plan with patient or surrogate, discussions with consultants, evaluation of patient's response to treatment, examination of patient, ordering and review of laboratory studies, ordering and review of radiographic studies, ordering and performing treatments and interventions, pulse oximetry, re-evaluation of patient's condition and review of old charts   I assumed direction of critical care for this patient from another provider in my specialty: no     Care discussed with: admitting provider      Medications Ordered in the ED - No data to display                                  Medical Decision Making Amount and/or Complexity of Data Reviewed Labs: ordered. Radiology: ordered.  Risk Prescription drug management. Decision regarding hospitalization.   58 year old female presents emergency department after being found altered at home.  Patient is typically ambulatory with a rollator and independent.  On arrival she is tachycardic, altered but opens eyes and moans to name.  Moving all 4 extremities.  EKG is sinus rhythm, concern for peaked T waves, more pronounced when compared to previous.  Blood work shows a leukocytosis with UTI.  Sepsis workup initiated, antibiotics ordered.  Labs also revealing a slightly worse than baseline AKI, DKA with  hyperkalemia.  Patient given treatment based off of hyperkalemia with EKG changes and DKA order set initiated.  On reevaluation patient's mentation has improved slightly in regards to alertness but she continues to be ill-appearing, fatigued.  Had ICU evaluate the patient and they feel comfortable with admission to hospitalist/progressive.  On reevaluation she continues to be easily arousable.  Therapies have been started and we will plan for hospital admission.  Patients evaluation and results requires admission for further treatment and care.  Spoke with hospitalist, reviewed patient's ED course and they accept admission.  Patient agrees with admission plan, offers no new complaints and is stable/unchanged at time of admit.     Final diagnoses:  None    ED Discharge Orders     None          Bari Roxie HERO, DO 11/21/23 1601

## 2023-11-21 NOTE — ED Notes (Signed)
 Placed pt on 2L O2 nasal cannula due to room air saturation 90%, tachycardia noted. MD aware.

## 2023-11-21 NOTE — Progress Notes (Signed)
     Patient Name: [name]           DOB: 1965/09/19  MRN: [id number]      Admission Date: 11/21/2023  Attending Provider: Zella Katha HERO, MD  Primary Diagnosis: DKA (diabetic ketoacidosis) (HCC)   Level of care: Stepdown   OVERNIGHT EVENT   HPI/ Events of Note  [name], 58 y.o. female, was admitted on 11/21/2023 for DKA (diabetic ketoacidosis) (HCC).  On admission found to have severe sepsis due to UTI as well as new onset A-fib with RVR.  Notified by bedside RN of patient being on max Cardizem  drip therapy-15 mg/h--however HR> 130's -150's.  Patient is now requiring 4-6 L [place], which is new since admission.  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    Plan: Atrial fibrillation RVR Continue Cardizem  gtt.  Currently maxed out on rate.   Will add on Cardizem  bolus to help with rate control  Chronic hypoxemic respiratory failure Requiring 4-6 L  to sustain SpO2> 92.  Breath sounds are coarse, nonlabored, without accessory muscle use.  Appears dry, received 2 L fluid bolus in the ED.  Currently receiving IV fluids for DKA/insulin  gtt. Added on chest x-ray, BNP, BiPAP at bedtime   Addendum: CXray- Cardiomegaly with vascular congestion. Diffuse interstitial and ground-glass opacity, possible edema  In the setting of sepsis, will hold off on diuretics as patient is not exhibiting signs of distress.  Encouraged use of BiPAP, which is her usual nighttime routine. As patient remains on insulin  gtt., she will require some fluids that have now been decreased.  Will need to closely monitor volume status.   Emari Demmer, DNP, ACNPC- [facility]

## 2023-11-21 NOTE — Sepsis Progress Note (Signed)
 eLink is following this Code Sepsis.

## 2023-11-21 NOTE — H&P (Signed)
 History and Physical  Mary MAULTSBY FMW:[id number] DOB: September 04, 1965 DOA: 11/21/2023  PCP: Elicia Hamlet, MD   Chief Complaint: Lethargy, confusion  HPI: [name] is a 58 y.o. female with medical history significant for bipolar disorder, heart failure with preserved EF, CKD stage III, type 2 diabetes being admitted to the hospital with severe sepsis due to UTI, complicated by hyperkalemia and A-fib with RVR.  History is provided by the patient's son who lives nearby and is in close contact with her.  States that she has been in her usual state of health, he last saw her in person about 3 days ago, spoke to her on the phone yesterday.  Yesterday when on the phone she sounded a little bit lethargic with a raspy voice, but otherwise seemed normal.  He came to pick her up this morning for a urology appointment where she gets Botox  for chronic urinary incontinence.  When he arrived, she was not out of bed and dressed, which was quite unusual for her.  She seemed to be somewhat confused and slow to respond.  He therefore called EMS and she was brought to the emergency department for evaluation.  Here, workup as detailed below shows evidence of acute on chronic renal failure, hyperkalemia, severe hyperglycemia and diabetic ketoacidosis.  Due to severity of her symptoms, ER provider consulted critical care medicine, who recommends that patient is stable for hospitalist admission.  Soon after being evaluated by critical care, the patient went into rapid atrial fibrillation.  Patient and her son deny any prior history of this.  Review of Systems: Please see HPI for pertinent positives and negatives. A complete 10 system review of systems are otherwise negative.  Past Medical History:  Diagnosis Date   Alcoholism in remission (HCC)    11-26-2021  pt stated no alcohol few yrs ago   Asthma    Bipolar disorder (HCC)    Chronic diastolic CHF (congestive heart failure) (HCC) 2014   followed by dr b.  lonni;   Chronic hypoxemic respiratory failure [facility])    pulmonologist--- dr brenna;   (11-26-2021 per pt was on supplemental oxygen  until she 01/ 2022 did not need it,  does not check O2 sats at home anymore.   CKD (chronic kidney disease), stage III [facility])    nephrologist--- dr dolan   COPD (chronic obstructive pulmonary disease) (HCC)    11-26-2021 pt trying to quit smoking ,  started age 23   Depression    Diabetes mellitus type 2, diet-controlled (HCC)    followed by pcp   (11-26-2021  per pt last A1c by pcp on 10-30-2021 7.8,  stated does not check blood sugar)   Dyspnea    Fibrocystic breast disease    GAD (generalized anxiety disorder)    GERD (gastroesophageal reflux disease)    Hepatitis B surface antigen positive 11/2017   pt stated asymptomatic ,  no treatment   History of cervical dysplasia 04/10/2011   '93- once dx.-got pregnant-no intervention, then postpartum, no dysplasia found   History of condyloma acuminatum 04/09/2011   Hyperlipidemia    Hypertension    Incomplete left bundle branch block (LBBB)    Lung nodule 11/2017   pulmonologist--- dr brenna;  RUL and RLL   Muscle spasm of both lower legs    chronic , due to back issues,   OA (osteoarthritis) 04/10/2011   hips, shoulders, back   OSA (obstructive sleep apnea) 2009   study on epic 09-18-2007 mild osa w/ hypoxmia ;   (  11-26-2021 per pt last used cpap 01/ 2022 before bariatric surgery 04/ 2022 stated lost wt / and stopped needing supplemental oxygen  , did not need anymore)   Polyneuropathy    due to back issues and DM   Polysubstance abuse (HCC)    11-26-2021  pt hx cocaine use stated none for yrs but ED visit 08-14-2021  positive UDS,  pt stated it was once only for depression none since   PSVT (paroxysmal supraventricular tachycardia)    followed by cardiology   Spinal stenosis, unspecified spinal region    Urge incontinence of urine    Wears dentures    upper   Past Surgical History:  Procedure  Laterality Date   ANAL FISTULECTOMY  04/17/2011   Procedure: FISTULECTOMY ANAL;  Surgeon: Donnice POUR. Belinda, MD;  Location: [facility];  Service: General;  Laterality: N/A;  Excision of Condyloma Gluteal Cleft    ANAL RECTAL MANOMETRY N/A 03/26/2019   Procedure: ANO RECTAL MANOMETRY;  Surgeon: Eda Iha, MD;  Location: [facility];  Service: Gastroenterology;  Laterality: N/A;   BIOPSY  03/09/2019   Procedure: BIOPSY;  Surgeon: Eda Iha, MD;  Location: [facility];  Service: Gastroenterology;;   BOTOX  INJECTION N/A 12/29/2018   Procedure: BOTOX  INJECTION(100 UNITS)  WITH CYSTOSCOPY;  Surgeon: Devere Lonni Righter, MD;  Location: [facility];  Service: Urology;  Laterality: N/A;   BOTOX  INJECTION N/A 07/27/2019   Procedure: BOTOX  INJECTION WITH CYSTOSCOPY;  Surgeon: Devere Lonni Righter, MD;  Location: [facility];  Service: Urology;  Laterality: N/A;  ONLY NEEDS 30 MIN   BOTOX  INJECTION N/A 05/02/2021   Procedure: BOTOX  INJECTION WITH CYSTOSCOPY;  Surgeon: Devere Lonni Righter, MD;  Location: [facility];  Service: Urology;  Laterality: N/A;  ONLY NEEDS 30 MIN   BOTOX  INJECTION N/A 11/28/2021   Procedure: BOTOX  INJECTION WITH CYSTOSCOPY, 200 UNITS;  Surgeon: Devere Lonni Righter, MD;  Location: [facility];  Service: Urology;  Laterality: N/A;  ONLY NEEDS 30 MIN   BOTOX  INJECTION N/A 05/15/2022   Procedure: BOTOX  INJECTION 200 UNITS;  Surgeon: Devere Lonni Righter, MD;  Location: [facility];  Service: Urology;  Laterality: N/A;   BREAST EXCISIONAL BIOPSY Right    x 3   BREAST EXCISIONAL BIOPSY Left    12-29-2000  and 11-30-2001 @[facility]  by dr curvin   BRONCHIAL BRUSHINGS  10/30/2023   Procedure: BRONCHOSCOPY, WITH BRUSH BIOPSY;  Surgeon: Zaida Zola SAILOR, MD;  Location: [facility];  Service: Pulmonary;;   BRONCHIAL NEEDLE ASPIRATION BIOPSY  10/30/2023   Procedure: BRONCHOSCOPY, WITH NEEDLE ASPIRATION BIOPSY;  Surgeon: Zaida Zola SAILOR, MD;  Location: [facility];  Service: Pulmonary;;   CARPAL TUNNEL RELEASE Bilateral    right 08-30-2002 and left 07-04-2003  by dr lucilla @ [facility]   CATARACT EXTRACTION W/ INTRAOCULAR LENS IMPLANT Bilateral 12/2020   CERVICAL FUSION  04/10/2011   x3- cervical fusion with plating and screws-Dr. Gust   COLONOSCOPY WITH PROPOFOL  N/A 07/06/2015   Procedure: COLONOSCOPY WITH PROPOFOL ;  Surgeon: Norleen Hint, MD;  Location: [facility];  Service: Endoscopy;  Laterality: N/A;   COLONOSCOPY WITH PROPOFOL  N/A 03/09/2019   Procedure: COLONOSCOPY WITH PROPOFOL ;  Surgeon: Eda Iha, MD;  Location: [facility];  Service: Gastroenterology;  Laterality: N/A;   CYSTOSCOPY N/A 05/15/2022   Procedure: CYSTOSCOPY;  Surgeon: Devere Lonni Righter, MD;  Location: [facility];  Service: Urology;  Laterality: N/A;   FRACTURE SURGERY     little toe left foot  I & D EXTREMITY Left 12/18/2016   Procedure: IRRIGATION AND DEBRIDEMENT LEFT FOOT, CLOSURE;  Surgeon: Jerri Kay HERO, MD;  Location: [facility];  Service: Orthopedics;  Laterality: Left;   INCISION AND DRAINAGE ABSCESS Right 05/03/2002   @[facility]  by dr curvin;   right breast abscess   INJECTION KNEE Right 10/03/2020   Procedure: KNEE INJECTION;  Surgeon: Lucilla Lynwood BRAVO, MD;  Location: [facility] OR;  Service: Orthopedics;  Laterality: Right;   KNEE ARTHROSCOPY WITH MEDIAL MENISECTOMY Right 03/21/2021   Procedure: RIGHT KNEE ARTHROSCOPY WITH MEDIAL AND PARTIAL LATERAL MENISCECTOMY;  Surgeon: Jerri Kay HERO, MD;  Location: [facility];  Service: Orthopedics;  Laterality: Right;   LAPAROSCOPIC GASTRIC SLEEVE RESECTION N/A 05/08/2020   Procedure: LAPAROSCOPIC GASTRIC SLEEVE RESECTION;  Surgeon: Stevie Herlene Righter, MD;  Location: [facility];  Service: General;  Laterality: N/A;   LUMBAR DISC SURGERY  04/10/2011   x5-Lumbar fusion-retained hardware.(Dr. Lucilla)   LUMBAR LAMINECTOMY N/A 10/03/2020   Procedure: THORACOLUMBAR LAMINECTOMIES  THORACIC ELEVEN-TWELVE, THORACIC TWELVE-LUMBAR ONE  AND LUMBAT ONE-TWO;  Surgeon: Lucilla Lynwood BRAVO, MD;  Location: [facility];  Service: Orthopedics;  Laterality: N/A;   LUMBAR PERCUTANEOUS PEDICLE SCREW 2 LEVEL  06/29/2021   Procedure: LUMBAR PERCUTANEOUS PEDICLE SCREW LUMBAR ONE-TWO;  Surgeon: Louis Shove, MD;  [facility]: [facility];  Service: Neurosurgery;;   MULTIPLE TOOTH EXTRACTIONS     POLYPECTOMY  03/09/2019   Procedure: POLYPECTOMY;  Surgeon: Eda Iha, MD;  [facility]: [facility];  Service: Gastroenterology;;   POSTERIOR LUMBAR FUSION  04/04/2005   @[facility]  by dr lucilla;   L3-- L5   POSTERIOR LUMBAR FUSION  03/04/2006   @[facility]  dr lucilla;   fusion L2--3 and redo L3--5   SHOULDER ARTHROSCOPY WITH ROTATOR CUFF REPAIR AND SUBACROMIAL DECOMPRESSION Left 05/07/2019   Procedure: LEFT SHOULDER ARTHROSCOPY WITH DEBRIDEMENT, DISTAL CLAVICLE EXCISION,  SUBACROMIAL DECOMPRESSION AND POSSIBLE ROTATOR CUFF REPAIR;  Surgeon: Jerri Kay HERO, MD;  [facility]: [facility];  Service: Orthopedics;  Laterality: Left;   TEE WITHOUT CARDIOVERSION N/A 04/06/2012   Procedure: TRANSESOPHAGEAL ECHOCARDIOGRAM (TEE);  Surgeon: Vinie KYM Maxcy, MD;  Location: [facility];  Service: Cardiovascular;  Laterality: N/A;   TRIGGER FINGER RELEASE Right    middle finger   TRIGGER FINGER RELEASE Left 06/28/2016   Procedure: RELEASE TRIGGER FINGER LEFT 3RD FINGER;  Surgeon: Jerri Kay HERO, MD;  [facility];  Service: Orthopedics;  Laterality: Left;   TRIGGER FINGER RELEASE Right 06/12/2016   Procedure: RIGHT INDEX FINGER TRIGGER RELEASE;  Surgeon: Jerri Kay HERO, MD;  [facility]: [facility];  Service: Orthopedics;  Laterality: Right;   TRIGGER FINGER RELEASE Right 01/19/2018   Procedure: RIGHT RING FINGER TRIGGER FINGER RELEASE;  Surgeon: Jerri Kay HERO, MD;  [facility]: [facility];  Service: Orthopedics;  Laterality: Right;   TRIGGER FINGER RELEASE Left 05/07/2019   Procedure: RELEASE TRIGGER FINGER LEFT INDEX FINGER;  Surgeon: Jerri Kay HERO, MD;   [facility];  Service: Orthopedics;  Laterality: Left;   UPPER GI ENDOSCOPY N/A 05/08/2020   Procedure: UPPER GI ENDOSCOPY;  Surgeon: Stevie, Herlene Righter, MD;  Location: [facility];  Service: General;  Laterality: N/A;   VIDEO BRONCHOSCOPY WITH ENDOBRONCHIAL NAVIGATION N/A 10/30/2023   Procedure: VIDEO BRONCHOSCOPY WITH ENDOBRONCHIAL NAVIGATION;  Surgeon: Zaida Zola SAILOR, MD;  Location: [facility];  Service: Pulmonary;  Laterality: N/A;   Social History:  reports that she has been smoking cigarettes. She started smoking about 46 years ago. She has a 23.4 pack-year smoking history. She has been exposed  to tobacco smoke. She has never used smokeless tobacco. She reports that she does not currently use alcohol. She reports that she does not currently use drugs.  Allergies  Allergen Reactions   Dorethia Bastos ] Other (See Comments)    Kidney disease   Methadose [Methadone Hcl] Other (See Comments)    Syncopal event   Corticosteroids Other (See Comments)    Hyperglycemia   Levaquin [Levofloxacin] Itching    Family History  Problem Relation Age of Onset   Pulmonary embolism Mother    Stroke Father    Alcohol abuse Father    Pneumonia Father    Hypertension Father    Breast cancer Maternal Aunt    Hypertension Other    Diabetes Other        Aunts and cousins   Asthma Sister    Depression Sister    Arthritis Sister    Sickle cell anemia Son      Prior to Admission medications   Medication Sig Start Date End Date Taking? Authorizing Provider  albuterol  (VENTOLIN  HFA) 108 (90 Base) MCG/ACT inhaler INHALE TWO PUFFS INTO THE LUNGS EVERY SIX HOURS AS NEEDED WHEEZING OR For SHORTNESS OF BREATH 09/11/23  Yes Elicia Hamlet, MD  atorvastatin  (LIPITOR) 40 MG tablet Take 1 tablet (40 mg total) by mouth daily. 06/04/23  Yes Rosendo Rush, MD  baclofen  (LIORESAL ) 10 MG tablet TAKE ONE TABLET (10mg  total) BY MOUTH THREE TIMES DAILY AS NEEDED muscle SPASMS 11/17/23  Yes Elicia Hamlet, MD  busPIRone   (BUSPAR ) 10 MG tablet Take 1 tablet (10 mg total) by mouth 3 (three) times daily. 06/18/23  Yes Rosendo Rush, MD  carvedilol  (COREG ) 12.5 MG tablet TAKE ONE TABLET (12.5MG  TOTAL) BY MOUTH TWICE DAILY 11/03/23  Yes Elicia Hamlet, MD  diclofenac  Sodium (VOLTAREN ) 1 % GEL APPLY FOUR grams topically FOUR TIMES DAILY 07/31/23  Yes Elicia Hamlet, MD  DILT-XR 120 MG 24 hr capsule TAKE ONE CAPSULE BY MOUTH TWICE DAILY 09/09/23  Yes Zheng, Jacky, MD  fluticasone  (FLONASE ) 50 MCG/ACT nasal spray instill TWO SPRAYS IN EACH NOSTRIL TWICE DAILY 08/19/23  Yes Zheng, Jacky, MD  furosemide  (LASIX ) 80 MG tablet TAKE ONE TABLET BY MOUTH TWICE DAILY relaTED TO SYSTEMIC (congestive heart failure) 11/14/23  Yes Elicia Hamlet, MD  gabapentin  (NEURONTIN ) 300 MG capsule Take 1 capsule (300 mg total) by mouth 3 (three) times daily. 11/13/23  Yes McDiarmid, Krystal BIRCH, MD  losartan  (COZAAR ) 25 MG tablet Take 25 mg by mouth daily.   Yes [provider]  montelukast  (SINGULAIR ) 10 MG tablet TAKE ONE TABLET BY MOUTH AT BEDTIME 10/17/23  Yes Zheng, Jacky, MD  omeprazole  (PRILOSEC) 20 MG capsule Take 1 capsule (20 mg total) by mouth daily. 06/09/23  Yes Rosendo Rush, MD  potassium chloride  SA (KLOR-CON  M) 20 MEQ tablet TAKE ONE TABLET ( TOTAL) BY MOUTH EVERY DAY 11/03/23  Yes Elicia Hamlet, MD  spironolactone  (ALDACTONE ) 25 MG tablet Take 0.5 tablets (12.5 mg total) by mouth daily. 08/19/23  Yes Elicia Hamlet, MD  TRELEGY ELLIPTA  100-62.5-25 MCG/ACT AEPB INHALE ONE PUFF INTO THE LUNGS EVERY DAY RELATED TO ACUTE AND CHRONIC RESPIRATOPR FAILURE 09/11/23  Yes Elicia Hamlet, MD  venlafaxine  XR (EFFEXOR -XR) 75 MG 24 hr capsule Take 1 capsule (75 mg total) by mouth daily with breakfast. 07/15/23  Yes Rosendo Rush, MD  acetaminophen  (TYLENOL ) 500 MG tablet Take 1,000 mg by mouth 2 (two) times daily.    [provider]  ammonium lactate  (LAC-HYDRIN ) 12 % lotion For dry skin  05/20/23   Rosendo Rush, MD  cloNIDine  (CATAPRES ) 0.2 MG  tablet Take 1 tablet (0.2 mg total) by mouth 2 (two) times daily. 09/13/22   Orlando Pond, DO  empagliflozin  (JARDIANCE ) 25 MG TABS tablet Take 1 tablet (25 mg total) by mouth daily before breakfast. 10/29/23   Elicia Hamlet, MD  gabapentin  (NEURONTIN ) 300 MG capsule Take 1 capsule (300 mg total) by mouth 3 (three) times daily. 11/14/23   Elicia Hamlet, MD  JARDIANCE  10 MG TABS tablet Take 10 mg by mouth daily.    [provider]  ketoconazole  (NIZORAL ) 2 % cream APPLY TO THE AFFECTED AREA(S) EVERY DAY 07/15/23   Loreda Hacker, DPM  loratadine  (CLARITIN ) 10 MG tablet TAKE ONE TABLET BY MOUTH EVERY DAY FOR allergies 02/12/23   Rosendo Rush, MD  losartan  (COZAAR ) 100 MG tablet Take 1 tablet (100 mg total) by mouth daily. Patient not taking: Reported on 11/21/2023 03/07/23   Rosendo Rush, MD  Multiple Vitamins-Minerals (MULTIVITAMIN ADULTS 50+) TABS Take 1 tablet by mouth daily.    [provider]  mupirocin  ointment (BACTROBAN ) 2 % Apply 1 Application topically 2 (two) times daily. To right elbow 10/29/23   Elicia Hamlet, MD  NARCAN  4 MG/0.1ML LIQD nasal spray kit Place 0.4 mg into the nose once as needed (accidental overdose). 02/16/18   [provider]  nystatin  (MYCOSTATIN ) 100000 UNIT/ML suspension take 5ml (500,000 units total) BY MOUTH FOUR TIMES DAILY 10/24/23   Elicia Hamlet, MD  sodium chloride  (OCEAN) 0.65 % SOLN nasal spray Place 1 spray into both nostrils as needed for congestion. 10/17/23   Elicia Hamlet, MD    Physical Exam: BP (!) 114/99   Pulse (!) 160   Temp 98.1 F (36.7 C) (Oral)   Resp (!) 30   LMP 01/29/1992   SpO2 92%  General: Somewhat somnolent, but easily arousable and then alert, oriented, calm, in no acute distress.  Her son is at the bedside.  Overall she looks dehydrated. Cardiovascular: Tachycardic, no murmurs or rubs, minimal peripheral edema  Respiratory: clear to auscultation bilaterally, no wheezes, no crackles  Abdomen: soft, nontender,  nondistended, normal bowel tones heard  Skin: dry, no rashes  Musculoskeletal: no joint effusions, normal range of motion  Psychiatric: appropriate affect, normal speech  Neurologic: extraocular muscles intact, clear speech, moving all extremities with intact sensorium         Labs on Admission:  Basic Metabolic Panel: Recent Labs  Lab 11/21/23 1344 11/21/23 1402  NA 137 135  K 6.4* 6.4*  CL 98 108  CO2 10*  --   GLUCOSE 468* 454*  BUN 42* 46*  CREATININE 1.52* 1.20*  CALCIUM  9.9  --   MG 3.2*  --    Liver Function Tests: Recent Labs  Lab 11/21/23 1344  AST 10*  ALT 14  ALKPHOS 172*  BILITOT 0.3  PROT 7.8  ALBUMIN  3.7   No results for input(s): LIPASE, AMYLASE in the last 168 hours. Recent Labs  Lab 11/21/23 1149  AMMONIA 26   CBC: Recent Labs  Lab 11/21/23 1149 11/21/23 1402  WBC 23.8*  --   NEUTROABS 18.9*  --   HGB 18.4* 17.0*  HCT 55.0* 50.0*  MCV 85.3  --   PLT 371  --    Cardiac Enzymes: No results for input(s): CKTOTAL, CKMB, CKMBINDEX, TROPONINI in the last 168 hours. BNP (last 3 results) No results for input(s): BNP in the last 8760 hours.  ProBNP (last 3 results) No results for  input(s): PROBNP in the last 8760 hours.  CBG: Recent Labs  Lab 11/21/23 1528 11/21/23 1609 11/21/23 1645  GLUCAP 457* 430* 373*    Radiological Exams on Admission: DG Chest Port 1 View Result Date: 11/21/2023 EXAM: 1 VIEW(S) XRAY OF THE CHEST 11/21/2023 11:28:00 AM COMPARISON: 10/30/2023 CLINICAL HISTORY: sob. Son called EMS due to pt being altered, twitching, and confused. Weakness beginning Wednesday, that has worsened. PT reported LEFT ear pain beginning Wednesday, and RIGHT ear pain beginning Thursday. Pt states that she has been unable to eat ; Hx of CHF, asthma, COPD, HTN, DM, PSVT, smoker ; Best obtainable image due to pt mental status, was held by a technologist for imaging FINDINGS: LUNGS AND PLEURA: No focal pulmonary opacity. No  pulmonary edema. No pleural effusion. No pneumothorax. HEART AND MEDIASTINUM: Similar cardiomegaly. No acute abnormality of the mediastinal silhouette. BONES AND SOFT TISSUES: Intact cervical spinal fixation hardware. No acute osseous abnormality. IMPRESSION: 1. No acute cardiopulmonary process. Electronically signed by: Norleen Boxer MD 11/21/2023 12:12 PM EDT RP Workstation: [id number]   CT Head Wo Contrast Result Date: 11/21/2023 EXAM: CT HEAD WITHOUT CONTRAST 11/21/2023 11:42:19 AM TECHNIQUE: CT of the head was performed without the administration of intravenous contrast. Automated exposure control, iterative reconstruction, and/or weight based adjustment of the mA/kV was utilized to reduce the radiation dose to as low as reasonably achievable. COMPARISON: CT head 09/08/2022. CLINICAL HISTORY: Mental status change, unknown cause. Table formatting from the original note was not included.; Pt arrives via EMS from home. Son called EMS due to pt being altered, twitching, and confused. EMS notes tachypnea and tachycardia. Pt alert, but drifts off to sleep between questions. ; ; 10:40 AM; Upon arrival of son at bedside: Weakness beginning Wednesday, that has worsened. PT reported LEFT ear pain beginning Wednesday, and RIGHT ear pain beginning Thursday. Pt states that she has been unable to eat. FINDINGS: BRAIN AND VENTRICLES: No acute hemorrhage. No evidence of acute infarct. No hydrocephalus. No extra-axial collection. No mass effect or midline shift. Dural calcifications. Nonspecific hypoattenuation in the periventricular and subcortical white matter, most likely representing chronic microvascular ischemic changes. Similar appearance of the vasculature of the skull base. ORBITS: Bilateral lens replacement. SINUSES: No acute abnormality. SOFT TISSUES AND SKULL: No acute soft tissue abnormality. No skull fracture. IMPRESSION: 1. No acute intracranial abnormality. 2. Chronic microvascular ischemic changes.  Electronically signed by: Donnice Mania MD 11/21/2023 11:51 AM EDT RP Workstation: [id number]   Assessment/Plan [name] is a 58 y.o. female with medical history significant for bipolar disorder, heart failure with preserved EF, CKD stage III, type 2 diabetes being admitted to the hospital with severe sepsis due to UTI, complicated by hyperkalemia and A-fib with RVR.   Severe sepsis due to UTI-meeting criteria with leukocytosis, tachycardia, endorgan dysfunction with AKI and altered mental status.  Lactate normal, patient is hemodynamically stable.  I suspect UTI led to DKA, dehydration, renal failure and hyperkalemia. -Inpatient admission -Monitor closely on stepdown unit -Follow-up blood and urine cultures -Continue aggressive fluid resuscitation, per DKA protocol -Empiric IV Rocephin   Diabetic ketoacidosis-on Jardiance  at home, historically has good control with last A1c 5.9 in August 2024. -Update hemoglobin A1c -N.p.o. -Aggressive fluid resuscitation per DKA protocol -IV insulin  drip  Hyperkalemia-with peaked T waves, received calcium  gluconate, Lokelma in the ER.  Receiving aggressive IV fluids and on insulin  drip. -Recheck stat BMP now as well as EKG, anticipate improvement with fluids and insulin  -Monitor closely on telemetry  Atrial fibrillation with RVR-no  prior history of atrial fibrillation, though she is on Cardizem  family suspects this is purely for hypertension.  She follows with cardiology I am unable to review their notes, but their diagnoses list does not include A-fib.  A-fib likely induced by sepsis and DKA. -IV Cardizem  drip -Check TSH -Update echo  UTI-source of her severe sepsis, treating with IV Rocephin  as above  Acute kidney injury superimposed on CKD stage III-most likely ATN from sepsis and UTI. -Hydrating as above, and treating sepsis -Avoid hypotension and nephrotoxins -Holding home nephrotoxic medications  Home p.o. medications including Lipitor,  baclofen , BuSpar , Coreg , gabapentin  are being held for the time being given her critical illness, and dysphagia noted in the ER.  SLP has been consulted for evaluation.  DVT prophylaxis: SQ heparin     Code Status: Full Code  Consults called: None  Admission status: Due to a high probability of clinically significant, life threatening deterioration, the patient required my highest level of preparedness to intervene emergently and I personally spent this critical care time directly and personally managing the patient. This critical care time included obtaining a history; examining the patient; reviewing vitals; ordering and review of studies; arranging urgent treatment with development of a management plan; evaluation of patient's response to treatment; frequent reassessment; and, discussions with other providers as well as available family.  The appropriate patient status for this patient is INPATIENT. Inpatient status is judged to be reasonable and necessary in order to provide the required intensity of service to ensure the patient's safety. The patient's presenting symptoms, physical exam findings, and initial radiographic and laboratory data in the context of their chronic comorbidities is felt to place them at high risk for further clinical deterioration. Furthermore, it is not anticipated that the patient will be medically stable for discharge from the hospital within 2 midnights of admission.    I certify that at the point of admission it is my clinical judgment that the patient will require inpatient hospital care spanning beyond 2 midnights from the point of admission due to high intensity of service, high risk for further deterioration and high frequency of surveillance required.  Total critical care time: Approximately 65 minutes  Mary Kopko CHRISTELLA Gail MD [facility] Pager [phone]  If 7PM-7AM, please contact night-coverage [email or web address] Password [email or web address]  11/21/2023, 5:06 PM

## 2023-11-21 NOTE — ED Notes (Signed)
 Pt requested water  but was unable to swallow a small sip. Performed swallow screen: FAIL. Placed order for SLP eval.

## 2023-11-21 NOTE — Progress Notes (Signed)
 Pharmacy Antibiotic Note  [name] is a 58 y.o. female admitted on 11/21/2023 with openly draining abscess on groin, purulent material present.  Pharmacy has been consulted for vancomycin  dosing. WBC 23.8, hypothermic, + UA.  SCr 1.52>> 1.2>> 1.35  Plan: Vancomycin  2 gm IV x 1 then vancomycin  1000 mg IV q24 for eAUC 475.9 using SCr 1.35, Vd 0.5, Css min 11.8 Ceftriaxone  2 gm x 1 in ED then Ceftriaxone  1 gm q24 for UTI per MD F/u renal fxn & culture data  Height: 5' 9 (175.3 cm) Weight: 98.1 kg (216 lb 4.3 oz) IBW/kg (Calculated) : 66.2  Temp (24hrs), Avg:97.9 F (36.6 C), Min:97.4 F (36.3 C), Max:98.1 F (36.7 C)  Recent Labs  Lab 11/21/23 1149 11/21/23 1230 11/21/23 1344 11/21/23 1357 11/21/23 1402  WBC 23.8*  --   --   --   --   CREATININE  --   --  1.52*  --  1.20*  LATICACIDVEN  --  1.9  --  1.5  --     Estimated Creatinine Clearance: 62.2 mL/min (A) (by C-G formula based on SCr of 1.2 mg/dL (H)).    Allergies  Allergen Reactions   Asa [Aspirin ] Other (See Comments)    Kidney disease   Methadose [Methadone Hcl] Other (See Comments)    Syncopal event   Corticosteroids Other (See Comments)    Hyperglycemia   Levaquin [Levofloxacin] Itching    Antimicrobials this admission: 10/24 ceftriaxone >> 10/24 vancomycin >> Dose adjustments this admission:  Microbiology results: 10/24 MRSA: 10/24 BCx2: 10/24 Grp A strep: negative 10/24 UCx:   Thank you for allowing pharmacy to be a part of this patient's care.  Rosaline IVAR Edison, Pharm.D Use secure chat for questions 11/21/2023 7:14 PM

## 2023-11-21 NOTE — Consult Note (Signed)
 NAME:  [name], MRN:  [id number], DOB:  05-27-1965, LOS: 0 ADMISSION DATE:  11/21/2023, CONSULTATION DATE:  11/21/23 REFERRING MD:  ED, CHIEF COMPLAINT:  AMS   History of Present Illness:  58 year old history of diabetes presents with encephalopathy of unknown duration, greater than 24 hours at least, found to have UTI and DKA.  She is arousable.  Unable to provide much history.  Can answer in short sentences but nods off.  History gathered by or via son at bedside.  He states spoke to her on Tuesday.  She was normal.  Spoke to her yesterday about 1 PM.  She was cleared herself.  Speech was on her cell.  Seems sleepy.  Arrange to pick her up this morning for urology appointment.  When he arrived she was seemingly worse off than last spoken to.  This prompted calling 911.  Chest x-ray clear on my evaluation and interpretation.  CT head without significant view.  Labs notable for hyperkalemia 6.4 with anion gap acidosis and hyperglycemia concerning for DKA.  Venous pH 7.21.  Her hemoglobin is elevated, chronically so with a bit higher than more recently, polycythemia present.  Her UA was positive for nitrites, leukocytes, and pyuria.  She was started on ceftriaxone .  Insulin  drip ordered, just being started at the time of my evaluation.  She will be given Lokelma for the hyperkalemia.  EKG shows some prominent T waves on my review but only in lead V3 and V4 and not throughout.    Pertinent  Medical History    Significant Hospital Events: Including procedures, antibiotic start and stop dates in addition to other pertinent events     Interim History / Subjective:    Objective    Blood pressure 127/82, pulse (!) 102, temperature 98.1 F (36.7 C), temperature source Oral, resp. rate 18, last menstrual period 01/29/1992, SpO2 96%.        Intake/Output Summary (Last 24 hours) at 11/21/2023 1542 Last data filed at 11/21/2023 1455 Gross per 24 hour  Intake 222.5 ml  Output --  Net  222.5 ml   There were no vitals filed for this visit.  Examination: General: Chronically ill appearing lying in stretcher HENT: Atraumatic normocephalic, hirsute Lungs: Rhonchorous, normal work of breathing Cardiovascular: Regular rate and rhythm Abdomen: Nondistended Extremities: No edema Neuro: Sleepy, drowsy, easily arousable with verbal stimuli, nonfocal, cranial nerves intact, right lower extremity 4 out of 5 rest 5 out of 5, per she has chronic gait instability.  No history of stroke  Resolved problem list   Assessment and Plan   Acute metabolic encephalopathy: Time of onset unclear, per report of son at bedside last normal Tuesday and spoke to her yesterday (10/23) and she was demonstrating signs of encephalopathy about 1 PM.  Notably worse this morning when he went to pick her up for planned urology appointment.  Most likely encephalopathy is related to sepsis due to to UTI and DKA.  Fortunately she easily aroused to voice and answer simple questions.  Clearly protecting her airway. -- Minimize sedating meds -- Treat underlying UTI and DKA and I expect improvement  Diabetic ketoacidosis: Anion gap is elevated, sugars are elevated, normal lactic acid .  Suspect triggered by UTI. -- IV insulin , crystalloid infusion, switch to D5 containing fluids when blood glucose is less than 250 --Has received just 1 L LR bolus, additional 1 L ordered now -- Every 4 hour BMP to evaluate anion gap closure  Hyperkalemia: I suspect she quite possibly  is total volume potassium depleted but hyperkalemic and blood due to hyperglycemia and lack of insulin  forcing potassium into cells.  I expect this will rapidly correct with IV insulin  infusion and I expect she could quite well become hypokalemic. -- Every 4 hour BMP as above for monitoring, make changes as necessary based on results -- Status post 1 dose of Lokelma 10 mg in the ED  Urinary tract infection: UA demonstrates positive nitrites, positive  leukocytes, pyuria.  Likely triggering DKA. -- Follow-up blood cultures, urine culture -- I recommend continuing ceftriaxone , first dose already given in ED, alter antibiotic regimen based on culture results  I recommend [facility] admission to stepdown for IV insulin  therapy and close lab work monitoring.  PCCM will sign off.  Labs   CBC: Recent Labs  Lab 11/21/23 1149 11/21/23 1402  WBC 23.8*  --   NEUTROABS 18.9*  --   HGB 18.4* 17.0*  HCT 55.0* 50.0*  MCV 85.3  --   PLT 371  --     Basic Metabolic Panel: Recent Labs  Lab 11/21/23 1344 11/21/23 1402  NA 137 135  K 6.4* 6.4*  CL 98 108  CO2 10*  --   GLUCOSE 468* 454*  BUN 42* 46*  CREATININE 1.52* 1.20*  CALCIUM  9.9  --   MG 3.2*  --    GFR: CrCl cannot be calculated (Unknown ideal weight.). Recent Labs  Lab 11/21/23 1149 11/21/23 1230 11/21/23 1357  WBC 23.8*  --   --   LATICACIDVEN  --  1.9 1.5    Liver Function Tests: Recent Labs  Lab 11/21/23 1344  AST 10*  ALT 14  ALKPHOS 172*  BILITOT 0.3  PROT 7.8  ALBUMIN  3.7   No results for input(s): LIPASE, AMYLASE in the last 168 hours. Recent Labs  Lab 11/21/23 1149  AMMONIA 26    ABG    Component Value Date/Time   PHART 7.260 (L) 09/09/2022 0507   PCO2ART 52.5 (H) 09/09/2022 0507   PO2ART 435 (H) 09/09/2022 0507   HCO3 11.2 (L) 11/21/2023 1149   TCO2 12 (L) 11/21/2023 1402   ACIDBASEDEF 15.2 (H) 11/21/2023 1149   O2SAT 65.5 11/21/2023 1149     Coagulation Profile: No results for input(s): INR, PROTIME in the last 168 hours.  Cardiac Enzymes: No results for input(s): CKTOTAL, CKMB, CKMBINDEX, TROPONINI in the last 168 hours.  HbA1C: HbA1c, POC (controlled diabetic range)  Date/Time Value Ref Range Status  09/09/2023 04:00 PM 6.8 0.0 - 7.0 % Final  10/30/2021 09:32 AM 7.8 (A) 0.0 - 7.0 % Final   Hgb A1c MFr Bld  Date/Time Value Ref Range Status  09/09/2022 02:30 AM 5.9 (H) 4.8 - 5.6 % Final    Comment:    (NOTE)          Prediabetes: 5.7 - 6.4         Diabetes: >6.4         Glycemic control for adults with diabetes: <7.0   06/10/2022 01:12 AM 6.6 (H) 4.8 - 5.6 % Final    Comment:    (NOTE) Pre diabetes:          5.7%-6.4%  Diabetes:              >6.4%  Glycemic control for   <7.0% adults with diabetes     CBG: Recent Labs  Lab 11/21/23 1528  GLUCAP 457*    Review of Systems:   Unable to obtain due to encephalopathy  Past  Medical History:  She,  has a past medical history of Alcoholism in remission (HCC), Asthma, Bipolar disorder (HCC), Chronic diastolic CHF (congestive heart failure) (HCC) (2014), Chronic hypoxemic respiratory failure (HCC), CKD (chronic kidney disease), stage III (HCC), COPD (chronic obstructive pulmonary disease) (HCC), Depression, Diabetes mellitus type 2, diet-controlled (HCC), Dyspnea, Fibrocystic breast disease, GAD (generalized anxiety disorder), GERD (gastroesophageal reflux disease), Hepatitis B surface antigen positive (11/2017), History of cervical dysplasia (04/10/2011), History of condyloma acuminatum (04/09/2011), Hyperlipidemia, Hypertension, Incomplete left bundle branch block (LBBB), Lung nodule (11/2017), Muscle spasm of both lower legs, OA (osteoarthritis) (04/10/2011), OSA (obstructive sleep apnea) (2009), Polyneuropathy, Polysubstance abuse (HCC), PSVT (paroxysmal supraventricular tachycardia), Spinal stenosis, unspecified spinal region, Urge incontinence of urine, and Wears dentures.   Surgical History:   Past Surgical History:  Procedure Laterality Date   ANAL FISTULECTOMY  04/17/2011   Procedure: FISTULECTOMY ANAL;  Surgeon: Donnice POUR. Belinda, MD;  Location: [facility];  Service: General;  Laterality: N/A;  Excision of Condyloma Gluteal Cleft    ANAL RECTAL MANOMETRY N/A 03/26/2019   Procedure: ANO RECTAL MANOMETRY;  Surgeon: Eda Iha, MD;  Location: [facility];  Service: Gastroenterology;  Laterality: N/A;   BIOPSY  03/09/2019   Procedure:  BIOPSY;  Surgeon: Eda Iha, MD;  Location: [facility];  Service: Gastroenterology;;   BOTOX  INJECTION N/A 12/29/2018   Procedure: BOTOX  INJECTION(100 UNITS)  WITH CYSTOSCOPY;  Surgeon: Devere Lonni Righter, MD;  Location: [facility];  Service: Urology;  Laterality: N/A;   BOTOX  INJECTION N/A 07/27/2019   Procedure: BOTOX  INJECTION WITH CYSTOSCOPY;  Surgeon: Devere Lonni Righter, MD;  Location: [facility];  Service: Urology;  Laterality: N/A;  ONLY NEEDS 30 MIN   BOTOX  INJECTION N/A 05/02/2021   Procedure: BOTOX  INJECTION WITH CYSTOSCOPY;  Surgeon: Devere Lonni Righter, MD;  Location: [facility];  Service: Urology;  Laterality: N/A;  ONLY NEEDS 30 MIN   BOTOX  INJECTION N/A 11/28/2021   Procedure: BOTOX  INJECTION WITH CYSTOSCOPY, 200 UNITS;  Surgeon: Devere Lonni Righter, MD;  Location[facility];  Service: Urology;  Laterality: N/A;  ONLY NEEDS 30 MIN   BOTOX  INJECTION N/A 05/15/2022   Procedure: BOTOX  INJECTION 200 UNITS;  Surgeon: Devere Lonni Righter, MD;  Location: [facility];  Service: Urology;  Laterality: N/A;   BREAST EXCISIONAL BIOPSY Right    x 3   BREAST EXCISIONAL BIOPSY Left    12-29-2000  and 11-30-2001 @[facility]  by dr curvin   BRONCHIAL BRUSHINGS  10/30/2023   Procedure: BRONCHOSCOPY, WITH BRUSH BIOPSY;  Surgeon: Zaida Zola SAILOR, MD;  Location: [facility];  Service: Pulmonary;;   BRONCHIAL NEEDLE ASPIRATION BIOPSY  10/30/2023   Procedure: BRONCHOSCOPY, WITH NEEDLE ASPIRATION BIOPSY;  Surgeon: Zaida Zola SAILOR, MD;  Location: [facility];  Service: Pulmonary;;   CARPAL TUNNEL RELEASE Bilateral    right 08-30-2002 and left 07-04-2003  by dr lucilla @ [facility]   CATARACT EXTRACTION W/ INTRAOCULAR LENS IMPLANT Bilateral 12/2020   CERVICAL FUSION  04/10/2011   x3- cervical fusion with plating and screws-Dr. Gust   COLONOSCOPY WITH PROPOFOL  N/A 07/06/2015   Procedure: COLONOSCOPY WITH PROPOFOL ;  Surgeon: Norleen Hint, MD;   Location: [facility];  Service: Endoscopy;  Laterality: N/A;   COLONOSCOPY WITH PROPOFOL  N/A 03/09/2019   Procedure: COLONOSCOPY WITH PROPOFOL ;  Surgeon: Eda Iha, MD;  Location: [facility];  Service: Gastroenterology;  Laterality: N/A;   CYSTOSCOPY N/A 05/15/2022   Procedure: CYSTOSCOPY;  Surgeon: Devere Lonni Righter, MD;  Location: [facility];  Service: Urology;  Laterality: N/A;   FRACTURE SURGERY     little toe left foot   I & D EXTREMITY Left 12/18/2016   Procedure: IRRIGATION AND DEBRIDEMENT LEFT FOOT, CLOSURE;  Surgeon: Jerri Kay HERO, MD;  Location: [facility];  Service: Orthopedics;  Laterality: Left;   INCISION AND DRAINAGE ABSCESS Right 05/03/2002   @[facility]  by dr curvin;   right breast abscess   INJECTION KNEE Right 10/03/2020   Procedure: KNEE INJECTION;  Surgeon: Lucilla Lynwood BRAVO, MD;  [facility];  Service: Orthopedics;  Laterality: Right;   KNEE ARTHROSCOPY WITH MEDIAL MENISECTOMY Right 03/21/2021   Procedure: RIGHT KNEE ARTHROSCOPY WITH MEDIAL AND PARTIAL LATERAL MENISCECTOMY;  Surgeon: Jerri Kay HERO, MD;  Location: [facility];  Service: Orthopedics;  Laterality: Right;   LAPAROSCOPIC GASTRIC SLEEVE RESECTION N/A 05/08/2020   Procedure: LAPAROSCOPIC GASTRIC SLEEVE RESECTION;  Surgeon: Stevie Herlene Righter, MD;  Location: [facility];  Service: General;  Laterality: N/A;   LUMBAR DISC SURGERY  04/10/2011   x5-Lumbar fusion-retained hardware.(Dr. Lucilla)   LUMBAR LAMINECTOMY N/A 10/03/2020   Procedure: THORACOLUMBAR LAMINECTOMIES THORACIC ELEVEN-TWELVE, THORACIC TWELVE-LUMBAR ONE  AND LUMBAT ONE-TWO;  Surgeon: Lucilla Lynwood BRAVO, MD;  Location: [facility];  Service: Orthopedics;  Laterality: N/A;   LUMBAR PERCUTANEOUS PEDICLE SCREW 2 LEVEL  06/29/2021   Procedure: LUMBAR PERCUTANEOUS PEDICLE SCREW LUMBAR ONE-TWO;  Surgeon: Louis Shove, MD;  Location: [facility];  Service: Neurosurgery;;   MULTIPLE TOOTH EXTRACTIONS     POLYPECTOMY  03/09/2019   Procedure:  POLYPECTOMY;  Surgeon: Eda Iha, MD;  Location: [facility];  Service: Gastroenterology;;   POSTERIOR LUMBAR FUSION  04/04/2005   @[facility]  by dr lucilla;   L3-- L5   POSTERIOR LUMBAR FUSION  03/04/2006   @[facility]  dr lucilla;   fusion L2--3 and redo L3--5   SHOULDER ARTHROSCOPY WITH ROTATOR CUFF REPAIR AND SUBACROMIAL DECOMPRESSION Left 05/07/2019   Procedure: LEFT SHOULDER ARTHROSCOPY WITH DEBRIDEMENT, DISTAL CLAVICLE EXCISION,  SUBACROMIAL DECOMPRESSION AND POSSIBLE ROTATOR CUFF REPAIR;  Surgeon: Jerri Kay HERO, MD;  Location: [facility];  Service: Orthopedics;  Laterality: Left;   TEE WITHOUT CARDIOVERSION N/A 04/06/2012   Procedure: TRANSESOPHAGEAL ECHOCARDIOGRAM (TEE);  Surgeon: Vinie KYM Maxcy, MD;  [facility];  Service: Cardiovascular;  Laterality: N/A;   TRIGGER FINGER RELEASE Right    middle finger   TRIGGER FINGER RELEASE Left 06/28/2016   Procedure: RELEASE TRIGGER FINGER LEFT 3RD FINGER;  Surgeon: Jerri Kay HERO, MD;  Location: [facility];  Service: Orthopedics;  Laterality: Left;   TRIGGER FINGER RELEASE Right 06/12/2016   Procedure: RIGHT INDEX FINGER TRIGGER RELEASE;  Surgeon: Jerri Kay HERO, MD;  Location: [facility];  Service: Orthopedics;  Laterality: Right;   TRIGGER FINGER RELEASE Right 01/19/2018   Procedure: RIGHT RING FINGER TRIGGER FINGER RELEASE;  Surgeon: Jerri Kay HERO, MD;  Location: [facility];  Service: Orthopedics;  Laterality: Right;   TRIGGER FINGER RELEASE Left 05/07/2019   Procedure: RELEASE TRIGGER FINGER LEFT INDEX FINGER;  Surgeon: Jerri Kay HERO, MD;  Location: [facility];  Service: Orthopedics;  Laterality: Left;   UPPER GI ENDOSCOPY N/A 05/08/2020   Procedure: UPPER GI ENDOSCOPY;  Surgeon: Stevie, Herlene Righter, MD;  Location: [facility];  Service: General;  Laterality: N/A;   VIDEO BRONCHOSCOPY WITH ENDOBRONCHIAL NAVIGATION N/A 10/30/2023   Procedure: VIDEO BRONCHOSCOPY WITH ENDOBRONCHIAL NAVIGATION;  Surgeon: Zaida Zola SAILOR, MD;  Location: [facility];  Service: Pulmonary;   Laterality: N/A;     Social History:   reports that she  has been smoking cigarettes. She started smoking about 46 years ago. She has a 23.4 pack-year smoking history. She has been exposed to tobacco smoke. She has never used smokeless tobacco. She reports that she does not currently use alcohol. She reports that she does not currently use drugs.   Family History:  Her family history includes Alcohol abuse in her father; Arthritis in her sister; Asthma in her sister; Breast cancer in her maternal aunt; Depression in her sister; Diabetes in an other family member; Hypertension in her father and another family member; Pneumonia in her father; Pulmonary embolism in her mother; Sickle cell anemia in her son; Stroke in her father.   Allergies Allergies  Allergen Reactions   Asa [Aspirin ] Other (See Comments)    Kidney disease   Methadose [Methadone Hcl] Other (See Comments)    Syncopal event   Corticosteroids Other (See Comments)    Hyperglycemia   Levaquin [Levofloxacin] Itching     Home Medications  Prior to Admission medications   Medication Sig Start Date End Date Taking? Authorizing Provider  albuterol  (VENTOLIN  HFA) 108 (90 Base) MCG/ACT inhaler INHALE TWO PUFFS INTO THE LUNGS EVERY SIX HOURS AS NEEDED WHEEZING OR For SHORTNESS OF BREATH 09/11/23  Yes Elicia Hamlet, MD  atorvastatin  (LIPITOR) 40 MG tablet Take 1 tablet (40 mg total) by mouth daily. 06/04/23  Yes Rosendo Rush, MD  baclofen  (LIORESAL ) 10 MG tablet TAKE ONE TABLET (10mg  total) BY MOUTH THREE TIMES DAILY AS NEEDED muscle SPASMS 11/17/23  Yes Elicia Hamlet, MD  busPIRone  (BUSPAR ) 10 MG tablet Take 1 tablet (10 mg total) by mouth 3 (three) times daily. 06/18/23  Yes Rosendo Rush, MD  carvedilol  (COREG ) 12.5 MG tablet TAKE ONE TABLET (12.5MG  TOTAL) BY MOUTH TWICE DAILY 11/03/23  Yes Elicia Hamlet, MD  diclofenac  Sodium (VOLTAREN ) 1 % GEL APPLY FOUR grams topically FOUR TIMES DAILY 07/31/23  Yes Elicia Hamlet, MD  DILT-XR 120 MG 24 hr  capsule TAKE ONE CAPSULE BY MOUTH TWICE DAILY 09/09/23  Yes Zheng, Jacky, MD  fluticasone  (FLONASE ) 50 MCG/ACT nasal spray instill TWO SPRAYS IN EACH NOSTRIL TWICE DAILY 08/19/23  Yes Zheng, Jacky, MD  furosemide  (LASIX ) 80 MG tablet TAKE ONE TABLET BY MOUTH TWICE DAILY relaTED TO SYSTEMIC (congestive heart failure) 11/14/23  Yes Elicia Hamlet, MD  gabapentin  (NEURONTIN ) 300 MG capsule Take 1 capsule (300 mg total) by mouth 3 (three) times daily. 11/13/23  Yes McDiarmid, Krystal BIRCH, MD  losartan  (COZAAR ) 25 MG tablet Take 25 mg by mouth daily.   Yes [provider]  montelukast  (SINGULAIR ) 10 MG tablet TAKE ONE TABLET BY MOUTH AT BEDTIME 10/17/23  Yes Zheng, Jacky, MD  omeprazole  (PRILOSEC) 20 MG capsule Take 1 capsule (20 mg total) by mouth daily. 06/09/23  Yes Rosendo Rush, MD  potassium chloride  SA (KLOR-CON  M) 20 MEQ tablet TAKE ONE TABLET ( TOTAL) BY MOUTH EVERY DAY 11/03/23  Yes Elicia Hamlet, MD  spironolactone  (ALDACTONE ) 25 MG tablet Take 0.5 tablets (12.5 mg total) by mouth daily. 08/19/23  Yes Elicia Hamlet, MD  TRELEGY ELLIPTA  100-62.5-25 MCG/ACT AEPB INHALE ONE PUFF INTO THE LUNGS EVERY DAY RELATED TO ACUTE AND CHRONIC RESPIRATOPR FAILURE 09/11/23  Yes Elicia Hamlet, MD  venlafaxine  XR (EFFEXOR -XR) 75 MG 24 hr capsule Take 1 capsule (75 mg total) by mouth daily with breakfast. 07/15/23  Yes Rosendo Rush, MD  acetaminophen  (TYLENOL ) 500 MG tablet Take 1,000 mg by mouth 2 (two) times daily.    [provider]  ammonium lactate  (LAC-HYDRIN )  12 % lotion For dry skin 05/20/23   Rosendo Rush, MD  cloNIDine  (CATAPRES ) 0.2 MG tablet Take 1 tablet (0.2 mg total) by mouth 2 (two) times daily. 09/13/22   Orlando Pond, DO  empagliflozin  (JARDIANCE ) 25 MG TABS tablet Take 1 tablet (25 mg total) by mouth daily before breakfast. 10/29/23   Elicia Hamlet, MD  gabapentin  (NEURONTIN ) 300 MG capsule Take 1 capsule (300 mg total) by mouth 3 (three) times daily. 11/14/23   Elicia Hamlet, MD  JARDIANCE   10 MG TABS tablet Take 10 mg by mouth daily.    [provider]  ketoconazole  (NIZORAL ) 2 % cream APPLY TO THE AFFECTED AREA(S) EVERY DAY 07/15/23   Loreda Hacker, DPM  loratadine  (CLARITIN ) 10 MG tablet TAKE ONE TABLET BY MOUTH EVERY DAY FOR allergies 02/12/23   Rosendo Rush, MD  losartan  (COZAAR ) 100 MG tablet Take 1 tablet (100 mg total) by mouth daily. Patient not taking: Reported on 11/21/2023 03/07/23   Rosendo Rush, MD  Multiple Vitamins-Minerals (MULTIVITAMIN ADULTS 50+) TABS Take 1 tablet by mouth daily.    [provider]  mupirocin  ointment (BACTROBAN ) 2 % Apply 1 Application topically 2 (two) times daily. To right elbow 10/29/23   Elicia Hamlet, MD  NARCAN  4 MG/0.1ML LIQD nasal spray kit Place 0.4 mg into the nose once as needed (accidental overdose). 02/16/18   [provider]  nystatin  (MYCOSTATIN ) 100000 UNIT/ML suspension take 5ml (500,000 units total) BY MOUTH FOUR TIMES DAILY 10/24/23   Elicia Hamlet, MD  sodium chloride  (OCEAN) 0.65 % SOLN nasal spray Place 1 spray into both nostrils as needed for congestion. 10/17/23   Elicia Hamlet, MD     Critical care time: n/a     Donnice JONELLE Beals, MD See TRACEY

## 2023-11-21 NOTE — ED Triage Notes (Addendum)
 Pt arrives via EMS from home. Son called EMS due to pt being altered, twitching, and confused. EMS notes tachypnea and tachycardia. Pt alert, but drifts off to sleep between questions.   10:40 AM Upon arrival of son at bedside: Weakness beginning Wednesday, that has worsened. PT reported LEFT ear pain beginning Wednesday, and RIGHT ear pain beginning Thursday. Pt states that she has been unable to eat

## 2023-11-22 ENCOUNTER — Inpatient Hospital Stay (HOSPITAL_COMMUNITY)

## 2023-11-22 ENCOUNTER — Other Ambulatory Visit: Payer: Self-pay

## 2023-11-22 DIAGNOSIS — I4891 Unspecified atrial fibrillation: Secondary | ICD-10-CM | POA: Diagnosis not present

## 2023-11-22 DIAGNOSIS — E1011 Type 1 diabetes mellitus with ketoacidosis with coma: Secondary | ICD-10-CM

## 2023-11-22 DIAGNOSIS — A419 Sepsis, unspecified organism: Secondary | ICD-10-CM | POA: Diagnosis not present

## 2023-11-22 DIAGNOSIS — R6520 Severe sepsis without septic shock: Secondary | ICD-10-CM

## 2023-11-22 DIAGNOSIS — E875 Hyperkalemia: Secondary | ICD-10-CM

## 2023-11-22 DIAGNOSIS — L8990 Pressure ulcer of unspecified site, unspecified stage: Secondary | ICD-10-CM | POA: Insufficient documentation

## 2023-11-22 DIAGNOSIS — N179 Acute kidney failure, unspecified: Secondary | ICD-10-CM | POA: Insufficient documentation

## 2023-11-22 LAB — GLUCOSE, CAPILLARY
Glucose-Capillary: 158 mg/dL — ABNORMAL HIGH (ref 70–99)
Glucose-Capillary: 176 mg/dL — ABNORMAL HIGH (ref 70–99)
Glucose-Capillary: 175 mg/dL — ABNORMAL HIGH (ref 70–99)
Glucose-Capillary: 172 mg/dL — ABNORMAL HIGH (ref 70–99)
Glucose-Capillary: 165 mg/dL — ABNORMAL HIGH (ref 70–99)
Glucose-Capillary: 167 mg/dL — ABNORMAL HIGH (ref 70–99)
Glucose-Capillary: 156 mg/dL — ABNORMAL HIGH (ref 70–99)
Glucose-Capillary: 206 mg/dL — ABNORMAL HIGH (ref 70–99)
Glucose-Capillary: 256 mg/dL — ABNORMAL HIGH (ref 70–99)
Glucose-Capillary: 243 mg/dL — ABNORMAL HIGH (ref 70–99)
Glucose-Capillary: 259 mg/dL — ABNORMAL HIGH (ref 70–99)
Glucose-Capillary: 258 mg/dL — ABNORMAL HIGH (ref 70–99)

## 2023-11-22 LAB — CBC
HCT: 48.1 % — ABNORMAL HIGH (ref 36.0–46.0)
Hemoglobin: 16.8 g/dL — ABNORMAL HIGH (ref 12.0–15.0)
MCH: 29.1 pg (ref 26.0–34.0)
MCHC: 34.9 g/dL (ref 30.0–36.0)
MCV: 83.2 fL (ref 80.0–100.0)
Platelets: 346 K/uL (ref 150–400)
RBC: 5.78 MIL/uL — ABNORMAL HIGH (ref 3.87–5.11)
RDW: 13.2 % (ref 11.5–15.5)
WBC: 18.2 K/uL — ABNORMAL HIGH (ref 4.0–10.5)
nRBC: 0 % (ref 0.0–0.2)

## 2023-11-22 LAB — BASIC METABOLIC PANEL WITH GFR
Anion gap: 13 (ref 5–15)
Anion gap: 12 (ref 5–15)
Anion gap: 17 — ABNORMAL HIGH (ref 5–15)
Anion gap: 14 (ref 5–15)
BUN: 37 mg/dL — ABNORMAL HIGH (ref 6–20)
BUN: 37 mg/dL — ABNORMAL HIGH (ref 6–20)
BUN: 36 mg/dL — ABNORMAL HIGH (ref 6–20)
BUN: 30 mg/dL — ABNORMAL HIGH (ref 6–20)
CO2: 21 mmol/L — ABNORMAL LOW (ref 22–32)
CO2: 20 mmol/L — ABNORMAL LOW (ref 22–32)
CO2: 16 mmol/L — ABNORMAL LOW (ref 22–32)
CO2: 19 mmol/L — ABNORMAL LOW (ref 22–32)
Calcium: 9.6 mg/dL (ref 8.9–10.3)
Calcium: 9.6 mg/dL (ref 8.9–10.3)
Calcium: 9.5 mg/dL (ref 8.9–10.3)
Calcium: 9.4 mg/dL (ref 8.9–10.3)
Chloride: 108 mmol/L (ref 98–111)
Chloride: 109 mmol/L (ref 98–111)
Chloride: 109 mmol/L (ref 98–111)
Chloride: 107 mmol/L (ref 98–111)
Creatinine, Ser: 1.11 mg/dL — ABNORMAL HIGH (ref 0.44–1.00)
Creatinine, Ser: 1.08 mg/dL — ABNORMAL HIGH (ref 0.44–1.00)
Creatinine, Ser: 0.99 mg/dL (ref 0.44–1.00)
Creatinine, Ser: 0.92 mg/dL (ref 0.44–1.00)
GFR, Estimated: 57 mL/min — ABNORMAL LOW (ref >60)
GFR, Estimated: 59 mL/min — ABNORMAL LOW (ref >60)
GFR, Estimated: >60 mL/min (ref >60)
GFR, Estimated: >60 mL/min (ref >60)
Glucose, Bld: 163 mg/dL — ABNORMAL HIGH (ref 70–99)
Glucose, Bld: 167 mg/dL — ABNORMAL HIGH (ref 70–99)
Glucose, Bld: 160 mg/dL — ABNORMAL HIGH (ref 70–99)
Glucose, Bld: 241 mg/dL — ABNORMAL HIGH (ref 70–99)
Potassium: 4.6 mmol/L (ref 3.5–5.1)
Potassium: 4.6 mmol/L (ref 3.5–5.1)
Potassium: 4.6 mmol/L (ref 3.5–5.1)
Potassium: 4.1 mmol/L (ref 3.5–5.1)
Sodium: 142 mmol/L (ref 135–145)
Sodium: 141 mmol/L (ref 135–145)
Sodium: 142 mmol/L (ref 135–145)
Sodium: 139 mmol/L (ref 135–145)

## 2023-11-22 LAB — ECHOCARDIOGRAM COMPLETE
Height: 69 in
S' Lateral: 2.5 cm
Weight: 3460.34 [oz_av]

## 2023-11-22 LAB — HEMOGLOBIN A1C
Hgb A1c MFr Bld: 9.8 % — ABNORMAL HIGH (ref 4.8–5.6)
Mean Plasma Glucose: 234.56 mg/dL

## 2023-11-22 LAB — BETA-HYDROXYBUTYRIC ACID
Beta-Hydroxybutyric Acid: 3.58 mmol/L — ABNORMAL HIGH (ref 0.05–0.27)
Beta-Hydroxybutyric Acid: 0.49 mmol/L — ABNORMAL HIGH (ref 0.05–0.27)
Beta-Hydroxybutyric Acid: 0.51 mmol/L — ABNORMAL HIGH (ref 0.05–0.27)

## 2023-11-22 LAB — PRO BRAIN NATRIURETIC PEPTIDE: Pro Brain Natriuretic Peptide: 2137 pg/mL — ABNORMAL HIGH (ref <300.0)

## 2023-11-22 MED ORDER — INSULIN ASPART 100 UNIT/ML IJ SOLN
0.0000 [IU] | Freq: Three times a day (TID) | INTRAMUSCULAR | Status: DC
  Administered 2023-11-23 (×2): 5 [IU] via SUBCUTANEOUS
  Administered 2023-11-23: 11 [IU] via SUBCUTANEOUS
  Administered 2023-11-24: 2 [IU] via SUBCUTANEOUS

## 2023-11-22 MED ORDER — TRAMADOL HCL 50 MG PO TABS
50.0000 mg | ORAL_TABLET | Freq: Four times a day (QID) | ORAL | Status: DC | PRN
  Administered 2023-11-22 – 2023-11-24 (×2): 50 mg via ORAL
  Filled 2023-11-22 (×2): qty 1

## 2023-11-22 MED ORDER — INSULIN GLARGINE-YFGN 100 UNIT/ML ~~LOC~~ SOLN
10.0000 [IU] | Freq: Once | SUBCUTANEOUS | Status: AC
  Administered 2023-11-22: 10 [IU] via SUBCUTANEOUS
  Filled 2023-11-22: qty 0.1

## 2023-11-22 MED ORDER — PERFLUTREN LIPID MICROSPHERE
1.0000 mL | INTRAVENOUS | Status: AC | PRN
  Administered 2023-11-22: 4 mL via INTRAVENOUS

## 2023-11-22 MED ORDER — BUSPIRONE HCL 5 MG PO TABS
10.0000 mg | ORAL_TABLET | Freq: Three times a day (TID) | ORAL | Status: DC
Start: 2023-11-22 — End: 2023-11-24
  Administered 2023-11-22 – 2023-11-24 (×5): 10 mg via ORAL
  Filled 2023-11-22 (×3): qty 1
  Filled 2023-11-22: qty 2
  Filled 2023-11-22: qty 1

## 2023-11-22 MED ORDER — INSULIN GLARGINE-YFGN 100 UNIT/ML ~~LOC~~ SOLN
5.0000 [IU] | SUBCUTANEOUS | Status: DC
  Administered 2023-11-22: 5 [IU] via SUBCUTANEOUS
  Filled 2023-11-22: qty 0.05

## 2023-11-22 MED ORDER — INSULIN GLARGINE-YFGN 100 UNIT/ML ~~LOC~~ SOLN
10.0000 [IU] | SUBCUTANEOUS | Status: DC
  Filled 2023-11-22: qty 0.1

## 2023-11-22 MED ORDER — DEXTROSE-SODIUM CHLORIDE 5-0.45 % IV SOLN: INTRAVENOUS | Status: DC

## 2023-11-22 MED ORDER — INSULIN ASPART 100 UNIT/ML IJ SOLN
4.0000 [IU] | Freq: Three times a day (TID) | INTRAMUSCULAR | Status: DC
  Administered 2023-11-23 – 2023-11-24 (×4): 4 [IU] via SUBCUTANEOUS

## 2023-11-22 MED ORDER — PHENOL 1.4 % MT LIQD: 1.0000 | OROMUCOSAL | Status: DC | PRN

## 2023-11-22 MED ORDER — INSULIN ASPART 100 UNIT/ML IJ SOLN
0.0000 [IU] | Freq: Every day | INTRAMUSCULAR | Status: DC
  Administered 2023-11-22: 3 [IU] via SUBCUTANEOUS

## 2023-11-22 MED ORDER — CARVEDILOL 12.5 MG PO TABS
12.5000 mg | ORAL_TABLET | Freq: Two times a day (BID) | ORAL | Status: DC
  Administered 2023-11-22 – 2023-11-24 (×4): 12.5 mg via ORAL
  Filled 2023-11-22 (×4): qty 1

## 2023-11-22 MED ORDER — INSULIN GLARGINE-YFGN 100 UNIT/ML ~~LOC~~ SOLN
20.0000 [IU] | Freq: Two times a day (BID) | SUBCUTANEOUS | Status: DC
  Administered 2023-11-22: 20 [IU] via SUBCUTANEOUS
  Filled 2023-11-22 (×2): qty 0.2

## 2023-11-22 MED ORDER — INSULIN GLARGINE-YFGN 100 UNIT/ML ~~LOC~~ SOLN
5.0000 [IU] | Freq: Two times a day (BID) | SUBCUTANEOUS | Status: DC
  Filled 2023-11-22: qty 0.05

## 2023-11-22 MED ORDER — ORAL CARE MOUTH RINSE
15.0000 mL | OROMUCOSAL | Status: DC | PRN
  Administered 2023-11-22: 15 mL via OROMUCOSAL

## 2023-11-22 MED ORDER — VANCOMYCIN HCL 1250 MG/250ML IV SOLN
1250.0000 mg | INTRAVENOUS | Status: DC
Start: 2023-11-22 — End: 2023-11-23
  Administered 2023-11-22: 1250 mg via INTRAVENOUS
  Filled 2023-11-22: qty 250

## 2023-11-22 MED ORDER — INSULIN ASPART 100 UNIT/ML IJ SOLN
0.0000 [IU] | INTRAMUSCULAR | Status: DC
  Administered 2023-11-22: 5 [IU] via SUBCUTANEOUS
  Administered 2023-11-22: 8 [IU] via SUBCUTANEOUS
  Administered 2023-11-22: 2 [IU] via SUBCUTANEOUS

## 2023-11-22 MED ORDER — ORAL CARE MOUTH RINSE
15.0000 mL | OROMUCOSAL | Status: DC
  Administered 2023-11-22 – 2023-11-24 (×9): 15 mL via OROMUCOSAL

## 2023-11-22 MED ORDER — PANTOPRAZOLE SODIUM 40 MG PO TBEC
40.0000 mg | DELAYED_RELEASE_TABLET | Freq: Every day | ORAL | Status: DC
  Administered 2023-11-22 – 2023-11-24 (×3): 40 mg via ORAL
  Filled 2023-11-22 (×3): qty 1

## 2023-11-22 NOTE — Evaluation (Signed)
 Physical Therapy Evaluation Patient Details Name: [name] MRN: [id number] DOB: [birth year] Today's Date: 11/22/2023  History of Present Illness  [name] is an 58 y.o. female   admitted 11/21/23 with severe sepsis due to UTI complicated by hyperkalemia, DKA and A-fib with RVR, acute metabolic encephalopathy.  PMH: bipolar disorder, HFpEF, chronic kidney disease stage III, diabetes mellitus type 2, essential hypertension.  Clinical Impression  Pt admitted with above diagnosis.  Pt currently with functional limitations due to the deficits listed below (see PT Problem List). Pt will benefit from acute skilled PT to increase their independence and safety with mobility to allow discharge.     The patient  is quite lethargic, did arouse with stimulation. Patient encouraged to roll, taking much extra time to initiate. Patient began sitting up with legs over bed edge then began to resist, stating over and over that  her back would hurt. Max Assist for  legs back onto bed.   Will ask for pain medication prior to therapy. Family reports history of back issues.  Patient's son and daughter present and provided  information. Patient resides aline, ambulated with a rollator, independent in ADL's and cooking, ordered groceries.  Patient oriented to self family and WL. Patient will benefit from continued inpatient follow up therapy, <3 hours/day       If plan is discharge home, recommend the following: Two people to help with walking and/or transfers;Two people to help with bathing/dressing/bathroom;Assistance with cooking/housework;Assist for transportation;Help with stairs or ramp for entrance   Can travel by private vehicle        Equipment Recommendations None recommended by PT  Recommendations for Other Services       Functional Status Assessment Patient has had a recent decline in their functional status and demonstrates the ability to make significant improvements in function in a  reasonable and predictable amount of time.     Precautions / Restrictions Precautions Precautions: Fall Restrictions Weight Bearing Restrictions Per Provider Order: No      Mobility  Bed Mobility               General bed mobility comments: Initaited rolling with much cues to reach for rail, patient  moved legs over bed edge, when beginning to sit upright, patient stopped and stated that her back would pain and declined furhter    Transfers                        Ambulation/Gait                  Stairs            Wheelchair Mobility     Tilt Bed    Modified Rankin (Stroke Patients Only)       Balance                                             Pertinent Vitals/Pain Pain Assessment Pain Assessment: 0-10 Breathing: occasional labored breathing, short period of hyperventilation Negative Vocalization: occasional moan/groan, low speech, negative/disapproving quality Facial Expression: sad, frightened, frown Body Language: tense, distressed pacing, fidgeting Consolability: distracted or reassured by voice/touch PAINAD Score: 5 Vocalization (extubated pts.): Sighing, moaning Pain Location: pt, kept repeating It's going to hurt my back with effort to sit up Pain Descriptors / Indicators: Aching, Contraction, Discomfort Pain Intervention(s): Patient  requesting pain meds-RN notified    Home Living Family/patient expects to be discharged to:: Private residence Living Arrangements: Alone Available Help at Discharge: Family;Available PRN/intermittently Type of Home: House Home Access: Stairs to enter Entrance Stairs-Rails: Doctor, General Practice of Steps: 4   Home Layout: One level Home Equipment: Retail [employer]er (4 wheels);Shower seat - built in Additional Comments: son present to provide info, patient lethargic    Prior Function Prior Level of Function : Independent/Modified Independent              Mobility Comments: uses rollator ADLs Comments: orders groceries     Extremity/Trunk Assessment   Upper Extremity Assessment Upper Extremity Assessment: Generalized weakness    Lower Extremity Assessment Lower Extremity Assessment: Generalized weakness    Cervical / Trunk Assessment Cervical / Trunk Assessment: Other exceptions Cervical / Trunk Exceptions: not fully tested as pt  declined to sit upright  Communication   Communication Communication: Impaired Factors Affecting Communication: Reduced clarity of speech    Cognition Arousal: Lethargic Behavior During Therapy: Anxious, Flat affect   PT - Cognitive impairments: Difficult to assess, Orientation, Awareness, Attention, Problem solving Difficult to assess due to: Impaired communication Orientation impairments: Time, Situation                   PT - Cognition Comments: O to  children, WL and month /yr Following commands: Impaired Following commands impaired: Follows one step commands inconsistently, Follows one step commands with increased time     Cueing Cueing Techniques: Verbal cues, Tactile cues     General Comments General comments (skin integrity, edema, etc.): NT    Exercises     Assessment/Plan    PT Assessment Patient needs continued PT services  PT Problem List Decreased strength;Decreased range of motion;Decreased cognition;Decreased activity tolerance;Decreased knowledge of use of DME;Decreased balance;Decreased safety awareness;Decreased mobility;Decreased knowledge of precautions       PT Treatment Interventions DME instruction;Balance training;Gait training;Cognitive remediation;Functional mobility training;Patient/family education;Therapeutic activities;Therapeutic exercise    PT Goals (Current goals can be found in the Care Plan section)  Acute Rehab PT Goals Patient Stated Goal: go home PT Goal Formulation: With patient/family Time For Goal Achievement:  12/06/23 Potential to Achieve Goals: Fair    Frequency Min 2X/week     Co-evaluation               AM-PAC PT 6 Clicks Mobility  Outcome Measure Help needed turning from your back to your side while in a flat bed without using bedrails?: Total Help needed moving from lying on your back to sitting on the side of a flat bed without using bedrails?: Total Help needed moving to and from a bed to a chair (including a wheelchair)?: Total Help needed standing up from a chair using your arms (e.g., wheelchair or bedside chair)?: Total Help needed to walk in hospital room?: Total Help needed climbing 3-5 steps with a railing? : Total 6 Click Score: 6    End of Session Equipment Utilized During Treatment: Oxygen  Activity Tolerance: Patient limited by pain;Patient limited by lethargy Patient left: in bed;with call bell/phone within reach;with bed alarm set;with family/visitor present Nurse Communication: Mobility status PT Visit Diagnosis: Unsteadiness on feet (R26.81);Other abnormalities of gait and mobility (R26.89);Muscle weakness (generalized) (M62.81);History of falling (Z91.81)    Time: 1241-1300 PT Time Calculation (min) (ACUTE ONLY): 19 min   Charges:   PT Evaluation $PT Eval Low Complexity: 1 Low   PT General Charges $$ ACUTE PT VISIT: 1 Visit  Darice Potters PT Acute Rehabilitation Services Office [phone]   Potters Darice Norris 11/22/2023, 2:45 PM

## 2023-11-22 NOTE — Progress Notes (Addendum)
 [facility] PROGRESS NOTE    Progress Note  [name]  FMW:[id number] DOB: July 15, 1965 DOA: 11/21/2023 PCP: Elicia Hamlet, MD     Brief Narrative:   [name] is an 58 y.o. female past medical history significant for bipolar disorder, HFpEF, chronic kidney disease stage III, diabetes mellitus type 2 admitted with severe sepsis due to UTI complicated by hyperkalemia, DKA and A-fib with RVR  Assessment/Plan:   Severe sepsis of unclear source possibly UTI: Patient was hemodynamically stable in the setting of sepsis and DKA. Started on IV fluids.  Continue strict I's and O's and daily weights. Will decrease IV fluids. Blood cultures and urine cultures were sent. Continue on IV Rocephin . She has remained afebrile and leukocytosis improved.  DKA (diabetic ketoacidosis)/ Type 2 diabetes mellitus with stage 3 chronic kidney disease, with long-term current use of insulin  : Hold all oral hypoglycemic agents, last A1c of 5.9 in 2024. Started on IV insulin  and IV fluids. Anion gap has closed, bicarb greater than 20. Will transition to long-acting insulin  plus sliding scale.  Overlap with IV insulin  for about 2 hours then discontinue IV insulin . She is currently n.p.o. continue CBGs Q4.  New onset A-fib with RVR: Suspect likely reactive in the setting of infection and DKA. Started on diltiazem  drip rate controlled. Having frequent PVCs on telemetry, continue diltiazem  drip.  But it seems like he has converted back to sinus rhythm. 2D echo pending.  Hyperkalemia: With peaked T waves she received calcium  gluconate and Lokelma in the ER. Aldactone  was held She was aggressively fluid resuscitated started on IV insulin  doses as improved so has her potassium.  Acute metabolic encephalopathy: Multifactorial likely due to infectious etiology possibly due to DKA. Still confused this morning. CT of the head showed no acute findings that showed chronic microvascular ischemic  changes.  Acute kidney injury on chronic kidney disease stage IIIb: With a baseline creatinine of around 0.6-0.9.  On admission 1.3. The setting ACE inhibitor Lasix , Aldactone  sepsis and DKA Started on IV fluids creatinine is trending down slowly  Essential hypertension Antihypertensive home regimen was held as blood pressure was borderline. Coreg  clonidine  Lasix  and losartan  were held. Blood pressure seems to be stable.  Small boil in the groin area: Currently in drainage, continue IV vancomycin .  Unlikely source of infection.  Present on admission pressure injury of skin sacrum stage II: Consult wound care   DVT prophylaxis: Lovenox  Family Communication:none Status is: Inpatient Remains inpatient appropriate because: Sepsis    Code Status:     Code Status Orders  (From admission, onward)           Start     Ordered   11/21/23 1642  Full code  Continuous       Question:  By:  Answer:  Consent: discussion documented in EHR   11/21/23 1647           Code Status History     Date Active Date Inactive Code Status Order ID Comments User Context   09/09/2022 0433 09/14/2022 0147 Full Code [id number]  Emilio Norleen BIRCH, PA-C ED   09/09/2022 0214 09/09/2022 0433 Full Code [id number]  Rosendo Norleen, MD ED   07/10/2022 1457 07/14/2022 1912 Full Code [id number]  Celinda Alm Lot, MD ED   06/09/2022 2052 06/14/2022 2209 Full Code [id number]  Izella Ismael NOVAK, MD ED   06/29/2021 1406 07/02/2021 2103 Full Code [id number]  Louis Shove, MD Inpatient   10/03/2020 1508 10/10/2020 1828 Full Code [id number]  Lucilla Lynwood BRAVO, MD Inpatient   05/08/2020 1617 05/10/2020 1724 Full Code [id number]  Kinsinger, Herlene Righter, MD Inpatient   09/24/2018 2302 09/25/2018 1917 Full Code [id number]  Ilean Sensing, MD ED   09/01/2018 1336 09/02/2018 0307 Full Code [id number]  Derrill Dawn, MD HOV   09/30/2017 1841 10/04/2017 2122 Full Code [id number]  Shirley, Jordan, DO Inpatient   12/17/2016 0147 12/20/2016 2139 Full Code  [id number]  Sebastian Righter NOVAK, MD ED   12/12/2016 1840 12/13/2016 1833 Full Code [id number]  Lanny Maxwell, MD ED         IV Access:   Peripheral IV   Procedures and diagnostic studies:   DG CHEST PORT 1 VIEW Result Date: 11/21/2023 CLINICAL DATA:  Shortness of breath EXAM: PORTABLE CHEST 1 VIEW COMPARISON:  11/21/2023, 10/30/2023, 09/09/2022 FINDINGS: Hardware in the cervical spine. Hypoventilatory change. Cardiomegaly with vascular congestion and diffuse interstitial and ground-glass opacity. No pleural effusion. IMPRESSION: Cardiomegaly with vascular congestion and diffuse interstitial and ground-glass opacity, possible edema Electronically Signed   By: Luke Bun M.D.   On: 11/21/2023 22:05   DG Chest Port 1 View Result Date: 11/21/2023 EXAM: 1 VIEW(S) XRAY OF THE CHEST 11/21/2023 11:28:00 AM COMPARISON: 10/30/2023 CLINICAL HISTORY: sob. Son called EMS due to pt being altered, twitching, and confused. Weakness beginning Wednesday, that has worsened. PT reported LEFT ear pain beginning Wednesday, and RIGHT ear pain beginning Thursday. Pt states that she has been unable to eat ; Hx of CHF, asthma, COPD, HTN, DM, PSVT, smoker ; Best obtainable image due to pt mental status, was held by a technologist for imaging FINDINGS: LUNGS AND PLEURA: No focal pulmonary opacity. No pulmonary edema. No pleural effusion. No pneumothorax. HEART AND MEDIASTINUM: Similar cardiomegaly. No acute abnormality of the mediastinal silhouette. BONES AND SOFT TISSUES: Intact cervical spinal fixation hardware. No acute osseous abnormality. IMPRESSION: 1. No acute cardiopulmonary process. Electronically signed by: Norleen Boxer MD 11/21/2023 12:12 PM EDT RP Workstation: [id number]   CT Head Wo Contrast Result Date: 11/21/2023 EXAM: CT HEAD WITHOUT CONTRAST 11/21/2023 11:42:19 AM TECHNIQUE: CT of the head was performed without the administration of intravenous contrast. Automated exposure control, iterative  reconstruction, and/or weight based adjustment of the mA/kV was utilized to reduce the radiation dose to as low as reasonably achievable. COMPARISON: CT head 09/08/2022. CLINICAL HISTORY: Mental status change, unknown cause. Table formatting from the original note was not included.; Pt arrives via EMS from home. Son called EMS due to pt being altered, twitching, and confused. EMS notes tachypnea and tachycardia. Pt alert, but drifts off to sleep between questions. ; ; 10:40 AM; Upon arrival of son at bedside: Weakness beginning Wednesday, that has worsened. PT reported LEFT ear pain beginning Wednesday, and RIGHT ear pain beginning Thursday. Pt states that she has been unable to eat. FINDINGS: BRAIN AND VENTRICLES: No acute hemorrhage. No evidence of acute infarct. No hydrocephalus. No extra-axial collection. No mass effect or midline shift. Dural calcifications. Nonspecific hypoattenuation in the periventricular and subcortical white matter, most likely representing chronic microvascular ischemic changes. Similar appearance of the vasculature of the skull base. ORBITS: Bilateral lens replacement. SINUSES: No acute abnormality. SOFT TISSUES AND SKULL: No acute soft tissue abnormality. No skull fracture. IMPRESSION: 1. No acute intracranial abnormality. 2. Chronic microvascular ischemic changes. Electronically signed by: Donnice Mania MD 11/21/2023 11:51 AM EDT RP Workstation: [id number]     Medical Consultants:   None.   Subjective:    [name] still confused this morning able to follow  simple commands  Objective:    Vitals:   11/22/23 0530 11/22/23 0545 11/22/23 0600 11/22/23 0615  BP: (!) 151/86 (!) 142/81 139/75 (!) 141/72  Pulse: 78 90 99 88  Resp: 20 (!) 21 (!) 21 (!) 21  Temp:      TempSrc:      SpO2: 92% 93% 92% 91%  Weight:      Height:       SpO2: 91 % O2 Flow Rate (L/min): 2 L/min   Intake/Output Summary (Last 24 hours) at 11/22/2023 0623 Last data filed at 11/22/2023  0434 Gross per 24 hour  Intake 4423.53 ml  Output 655 ml  Net 3768.53 ml   Filed Weights   11/21/23 1800  Weight: 98.1 kg    Exam: General exam: In no acute distress. Respiratory system: Good air movement and clear to auscultation. Cardiovascular system: S1 & S2 heard, RRR. No JVD. Gastrointestinal system: Abdomen is nondistended, soft and nontender.  Central nervous system: Alert and oriented only to person Extremities: No pedal edema. Skin: Small possible abscess likely an ingrown hair in the right groin area Psychiatry: No judgment or insight, following simple commands still confused this morning.   Data Reviewed:    Labs: Basic Metabolic Panel: Recent Labs  Lab 11/21/23 1344 11/21/23 1402 11/21/23 1821 11/21/23 2059 11/22/23 0102 11/22/23 0516  NA 137 135 141 142 142 141  K 6.4* 6.4* 5.0 5.2* 4.6 4.6  CL 98 108 105 106 108 109  CO2 10*  --  13* 20* 21* 20*  GLUCOSE 468* 454* 251* 233* 163* 167*  BUN 42* 46* 39* 39* 37* 37*  CREATININE 1.52* 1.20* 1.35* 1.28* 1.11* 1.08*  CALCIUM  9.9  --  9.6 9.6 9.6 9.6  MG 3.2*  --   --   --   --   --    GFR Estimated Creatinine Clearance: 69.1 mL/min (A) (by C-G formula based on SCr of 1.08 mg/dL (H)). Liver Function Tests: Recent Labs  Lab 11/21/23 1344  AST 10*  ALT 14  ALKPHOS 172*  BILITOT 0.3  PROT 7.8  ALBUMIN  3.7   No results for input(s): LIPASE, AMYLASE in the last 168 hours. Recent Labs  Lab 11/21/23 1149  AMMONIA 26   Coagulation profile No results for input(s): INR, PROTIME in the last 168 hours. COVID-19 Labs  No results for input(s): DDIMER, FERRITIN, LDH, CRP in the last 72 hours.  Lab Results  Component Value Date   SARSCOV2NAA NEGATIVE 11/21/2023   SARSCOV2NAA RESULT: NEGATIVE 09/29/2020   SARSCOV2NAA NEGATIVE 05/04/2020   SARSCOV2NAA NEGATIVE 07/23/2019    CBC: Recent Labs  Lab 11/21/23 1149 11/21/23 1402 11/22/23 0516  WBC 23.8*  --  18.2*  NEUTROABS 18.9*   --   --   HGB 18.4* 17.0* 16.8*  HCT 55.0* 50.0* 48.1*  MCV 85.3  --  83.2  PLT 371  --  346   Cardiac Enzymes: No results for input(s): CKTOTAL, CKMB, CKMBINDEX, TROPONINI in the last 168 hours. BNP (last 3 results) Recent Labs    11/22/23 0516  PROBNP 2,137.0*   CBG: Recent Labs  Lab 11/22/23 0203 11/22/23 0303 11/22/23 0406 11/22/23 0516 11/22/23 0619  GLUCAP 176* 175* 172* 165* 167*   D-Dimer: No results for input(s): DDIMER in the last 72 hours. Hgb A1c: No results for input(s): HGBA1C in the last 72 hours. Lipid Profile: No results for input(s): CHOL, HDL, LDLCALC, TRIG, CHOLHDL, LDLDIRECT in the last 72 hours. Thyroid  function studies: Recent Labs  11/21/23 1821  TSH <0.100*   Anemia work up: No results for input(s): VITAMINB12, FOLATE, FERRITIN, TIBC, IRON , RETICCTPCT in the last 72 hours. Sepsis Labs: Recent Labs  Lab 11/21/23 1149 11/21/23 1230 11/21/23 1357 11/22/23 0516  WBC 23.8*  --   --  18.2*  LATICACIDVEN  --  1.9 1.5  --    Microbiology Recent Results (from the past 240 hours)  Group A Strep by PCR     Status: None   Collection Time: 11/21/23  1:44 PM   Specimen: Peripheral; Sterile Swab  Result Value Ref Range Status   Group A Strep by PCR NOT DETECTED NOT DETECTED Final    Comment: Performed at [facility], [address], [place], [place] [address]  Resp panel by RT-PCR (RSV, Flu A&B, Covid) Anterior Nasal Swab     Status: None   Collection Time: 11/21/23  3:33 PM   Specimen: Anterior Nasal Swab  Result Value Ref Range Status   SARS Coronavirus 2 by RT PCR NEGATIVE NEGATIVE Final    Comment: (NOTE) SARS-CoV-2 target nucleic acids are NOT DETECTED.  The SARS-CoV-2 RNA is generally detectable in upper respiratory specimens during the acute phase of infection. The lowest concentration of SARS-CoV-2 viral copies this assay can detect is 138 copies/mL. A negative result does  not preclude SARS-Cov-2 infection and should not be used as the sole basis for treatment or other patient management decisions. A negative result may occur with  improper specimen collection/handling, submission of specimen other than nasopharyngeal swab, presence of viral mutation(s) within the areas targeted by this assay, and inadequate number of viral copies(<138 copies/mL). A negative result must be combined with clinical observations, patient history, and epidemiological information. The expected result is Negative.  Fact Sheet for Patients:  bloggercourse.com  Fact Sheet for Healthcare Providers:  seriousbroker.it  This test is no t yet approved or cleared by the [place]  FDA and  has been authorized for detection and/or diagnosis of SARS-CoV-2 by FDA under an Emergency Use Authorization (EUA). This EUA will remain  in effect (meaning this test can be used) for the duration of the COVID-19 declaration under Section 564(b)(1) of the Act, 21 U.S.C.section 360bbb-3(b)(1), unless the authorization is terminated  or revoked sooner.       Influenza A by PCR NEGATIVE NEGATIVE Final   Influenza B by PCR NEGATIVE NEGATIVE Final    Comment: (NOTE) The Xpert Xpress SARS-CoV-2/FLU/RSV plus assay is intended as an aid in the diagnosis of influenza from Nasopharyngeal swab specimens and should not be used as a sole basis for treatment. Nasal washings and aspirates are unacceptable for Xpert Xpress SARS-CoV-2/FLU/RSV testing.  Fact Sheet for Patients: bloggercourse.com  Fact Sheet for Healthcare Providers: seriousbroker.it  This test is not yet approved or cleared by the [place]  FDA and has been authorized for detection and/or diagnosis of SARS-CoV-2 by FDA under an Emergency Use Authorization (EUA). This EUA will remain in effect (meaning this test can be used) for the  duration of the COVID-19 declaration under Section 564(b)(1) of the Act, 21 U.S.C. section 360bbb-3(b)(1), unless the authorization is terminated or revoked.     Resp Syncytial Virus by PCR NEGATIVE NEGATIVE Final    Comment: (NOTE) Fact Sheet for Patients: bloggercourse.com  Fact Sheet for Healthcare Providers: seriousbroker.it  This test is not yet approved or cleared by the [place]  FDA and has been authorized for detection and/or diagnosis of SARS-CoV-2 by FDA under an Emergency Use Authorization (EUA).  This EUA will remain in effect (meaning this test can be used) for the duration of the COVID-19 declaration under Section 564(b)(1) of the Act, 21 U.S.C. section 360bbb-3(b)(1), unless the authorization is terminated or revoked.  Performed at [facility], [address] [place], [place] [address]   MRSA Next Gen by PCR, Nasal     Status: None   Collection Time: 11/21/23  6:26 PM   Specimen: Nasal Mucosa; Nasal Swab  Result Value Ref Range Status   MRSA by PCR Next Gen NOT DETECTED NOT DETECTED Final    Comment: (NOTE) The GeneXpert MRSA Assay (FDA approved for NASAL specimens only), is one component of a comprehensive MRSA colonization surveillance program. It is not intended to diagnose MRSA infection nor to guide or monitor treatment for MRSA infections. Test performance is not FDA approved in patients less than 77 years old. Performed at [facility], [address], [place], [place] [address]      Medications:    Chlorhexidine  Gluconate Cloth  6 each Topical Daily   diltiazem   15 mg Intravenous Once   heparin   5,000 Units Subcutaneous Q8H   sodium zirconium cyclosilicate  10 g Oral Once   Continuous Infusions:  cefTRIAXone  (ROCEPHIN )  IV     dextrose  5% lactated ringers  50 mL/hr at 11/22/23 0434   diltiazem  (CARDIZEM ) infusion 15 mg/hr (11/22/23 0434)   insulin  4  Units/hr (11/22/23 0434)   lactated ringers  Stopped (11/21/23 1801)   vancomycin         LOS: 1 day   [name]  [facility]  11/22/2023, 6:23 AM

## 2023-11-22 NOTE — Progress Notes (Signed)
   11/22/23 0002  BiPAP/CPAP/SIPAP  $ Non-Invasive Ventilator  Non-Invasive Vent Initial  $ Face Mask Medium Yes  BiPAP/CPAP/SIPAP Pt Type Adult  BiPAP/CPAP/SIPAP Resmed  Mask Type Full face mask (what patient uses at home)  Dentures removed? Not applicable  Mask Size Medium  Respiratory Rate 16 breaths/min  IPAP 20 cmH20 (per sleep study recommendations)  EPAP 16 cmH2O  Pressure Support 3 cmH20  Flow Rate 2 lpm  Patient Home Machine No  Patient Home Mask No  Patient Home Tubing No  Auto Titrate No  BiPAP/CPAP /SiPAP Vitals  Resp 16  Bilateral Breath Sounds Inspiratory wheezes;Diminished  MEWS Score/Color  MEWS Score 2  MEWS Score Color Yellow

## 2023-11-22 NOTE — Plan of Care (Signed)

## 2023-11-22 NOTE — Evaluation (Signed)
 Clinical/Bedside Swallow Evaluation Patient Details  Name: [name] MRN: [id number] Date of Birth: 1965/06/04  Today's Date: 11/22/2023 Time: SLP Start Time (ACUTE ONLY): 1535 SLP Stop Time (ACUTE ONLY): 1601 SLP Time Calculation (min) (ACUTE ONLY): 26 min  Past Medical History:  Past Medical History:  Diagnosis Date   Alcoholism in remission (HCC)    11-26-2021  pt stated no alcohol few yrs ago   Asthma    Bipolar disorder (HCC)    Chronic diastolic CHF (congestive heart failure) (HCC) 2014   followed by dr b. lonni;   Chronic hypoxemic respiratory failure [facility])    pulmonologist--- dr brenna;   (11-26-2021 per pt was on supplemental oxygen  until she 01/ 2022 did not need it,  does not check O2 sats at home anymore.   CKD (chronic kidney disease), stage III [facility])    nephrologist--- dr dolan   COPD (chronic obstructive pulmonary disease) (HCC)    11-26-2021 pt trying to quit smoking ,  started age 69   Depression    Diabetes mellitus type 2, diet-controlled (HCC)    followed by pcp   (11-26-2021  per pt last A1c by pcp on 10-30-2021 7.8,  stated does not check blood sugar)   Dyspnea    Fibrocystic breast disease    GAD (generalized anxiety disorder)    GERD (gastroesophageal reflux disease)    Hepatitis B surface antigen positive 11/2017   pt stated asymptomatic ,  no treatment   History of cervical dysplasia 04/10/2011   '93- once dx.-got pregnant-no intervention, then postpartum, no dysplasia found   History of condyloma acuminatum 04/09/2011   Hyperlipidemia    Hypertension    Incomplete left bundle branch block (LBBB)    Lung nodule 11/2017   pulmonologist--- dr brenna;  RUL and RLL   Muscle spasm of both lower legs    chronic , due to back issues,   OA (osteoarthritis) 04/10/2011   hips, shoulders, back   OSA (obstructive sleep apnea) 2009   study on epic 09-18-2007 mild osa w/ hypoxmia ;   (11-26-2021 per pt last used cpap 01/ 2022 before bariatric  surgery 04/ 2022 stated lost wt / and stopped needing supplemental oxygen  , did not need anymore)   Polyneuropathy    due to back issues and DM   Polysubstance abuse (HCC)    11-26-2021  pt hx cocaine use stated none for yrs but ED visit 08-14-2021  positive UDS,  pt stated it was once only for depression none since   PSVT (paroxysmal supraventricular tachycardia)    followed by cardiology   Spinal stenosis, unspecified spinal region    Urge incontinence of urine    Wears dentures    upper   Past Surgical History:  Past Surgical History:  Procedure Laterality Date   ANAL FISTULECTOMY  04/17/2011   Procedure: FISTULECTOMY ANAL;  Surgeon: Donnice POUR. Belinda, MD;  Location: [facility];  Service: General;  Laterality: N/A;  Excision of Condyloma Gluteal Cleft    ANAL RECTAL MANOMETRY N/A 03/26/2019   Procedure: ANO RECTAL MANOMETRY;  Surgeon: Eda Iha, MD;  Location: [facility];  Service: Gastroenterology;  Laterality: N/A;   BIOPSY  03/09/2019   Procedure: BIOPSY;  Surgeon: Eda Iha, MD;  Location: [facility];  Service: Gastroenterology;;   BOTOX  INJECTION N/A 12/29/2018   Procedure: BOTOX  INJECTION(100 UNITS)  WITH CYSTOSCOPY;  Surgeon: Devere Lonni Righter, MD;  Location: [facility];  Service: Urology;  Laterality: N/A;   BOTOX  INJECTION N/A 07/27/2019  Procedure: BOTOX  INJECTION WITH CYSTOSCOPY;  Surgeon: Devere Lonni Righter, MD;  Location: [facility];  Service: Urology;  Laterality: N/A;  ONLY NEEDS 30 MIN   BOTOX  INJECTION N/A 05/02/2021   Procedure: BOTOX  INJECTION WITH CYSTOSCOPY;  Surgeon: Devere Lonni Righter, MD;  Location: [facility];  Service: Urology;  Laterality: N/A;  ONLY NEEDS 30 MIN   BOTOX  INJECTION N/A 11/28/2021   Procedure: BOTOX  INJECTION WITH CYSTOSCOPY, 200 UNITS;  Surgeon: Devere Lonni Righter, MD;  Location: [facility];  Service: Urology;  Laterality: N/A;  ONLY NEEDS 30 MIN   BOTOX  INJECTION N/A  05/15/2022   Procedure: BOTOX  INJECTION 200 UNITS;  Surgeon: Devere Lonni Righter, MD;  Location[facility];  Service: Urology;  Laterality: N/A;   BREAST EXCISIONAL BIOPSY Right    x 3   BREAST EXCISIONAL BIOPSY Left    12-29-2000  and 11-30-2001 @[facility]  by dr curvin   BRONCHIAL BRUSHINGS  10/30/2023   Procedure: BRONCHOSCOPY, WITH BRUSH BIOPSY;  Surgeon: Zaida Zola SAILOR, MD;  Location: [facility];  Service: Pulmonary;;   BRONCHIAL NEEDLE ASPIRATION BIOPSY  10/30/2023   Procedure: BRONCHOSCOPY, WITH NEEDLE ASPIRATION BIOPSY;  Surgeon: Zaida Zola SAILOR, MD;  Location: [facility];  Service: Pulmonary;;   CARPAL TUNNEL RELEASE Bilateral    right 08-30-2002 and left 07-04-2003  by dr lucilla @ [facility]   CATARACT EXTRACTION W/ INTRAOCULAR LENS IMPLANT Bilateral 12/2020   CERVICAL FUSION  04/10/2011   x3- cervical fusion with plating and screws-Dr. Gust   COLONOSCOPY WITH PROPOFOL  N/A 07/06/2015   Procedure: COLONOSCOPY WITH PROPOFOL ;  Surgeon: Norleen Hint, MD;  Location: [facility];  Service: Endoscopy;  Laterality: N/A;   COLONOSCOPY WITH PROPOFOL  N/A 03/09/2019   Procedure: COLONOSCOPY WITH PROPOFOL ;  Surgeon: Eda Iha, MD;  [facility]: [facility];  Service: Gastroenterology;  Laterality: N/A;   CYSTOSCOPY N/A 05/15/2022   Procedure: CYSTOSCOPY;  Surgeon: Devere Lonni Righter, MD;  Location[facility];  Service: Urology;  Laterality: N/A;   FRACTURE SURGERY     little toe left foot   I & D EXTREMITY Left 12/18/2016   Procedure: IRRIGATION AND DEBRIDEMENT LEFT FOOT, CLOSURE;  Surgeon: Jerri Kay HERO, MD;  Location: [facility];  Service: Orthopedics;  Laterality: Left;   INCISION AND DRAINAGE ABSCESS Right 05/03/2002   @[facility]  by dr curvin;   right breast abscess   INJECTION KNEE Right 10/03/2020   Procedure: KNEE INJECTION;  Surgeon: Lucilla Lynwood BRAVO, MD;  Location: [facility] OR;  Service: Orthopedics;  Laterality: Right;   KNEE ARTHROSCOPY WITH MEDIAL MENISECTOMY Right  03/21/2021   Procedure: RIGHT KNEE ARTHROSCOPY WITH MEDIAL AND PARTIAL LATERAL MENISCECTOMY;  Surgeon: Jerri Kay HERO, MD;  Location: [facility];  Service: Orthopedics;  Laterality: Right;   LAPAROSCOPIC GASTRIC SLEEVE RESECTION N/A 05/08/2020   Procedure: LAPAROSCOPIC GASTRIC SLEEVE RESECTION;  Surgeon: Stevie Herlene Righter, MD;  Location: [facility];  Service: General;  Laterality: N/A;   LUMBAR DISC SURGERY  04/10/2011   x5-Lumbar fusion-retained hardware.(Dr. Lucilla)   LUMBAR LAMINECTOMY N/A 10/03/2020   Procedure: THORACOLUMBAR LAMINECTOMIES THORACIC ELEVEN-TWELVE, THORACIC TWELVE-LUMBAR ONE  AND LUMBAT ONE-TWO;  Surgeon: Lucilla Lynwood BRAVO, MD;  Location: [facility];  Service: Orthopedics;  Laterality: N/A;   LUMBAR PERCUTANEOUS PEDICLE SCREW 2 LEVEL  06/29/2021   Procedure: LUMBAR PERCUTANEOUS PEDICLE SCREW LUMBAR ONE-TWO;  Surgeon: Louis Shove, MD;  Location: [facility];  Service: Neurosurgery;;   MULTIPLE TOOTH EXTRACTIONS     POLYPECTOMY  03/09/2019  Procedure: POLYPECTOMY;  Surgeon: Eda Iha, MD;  Location: [facility];  Service: Gastroenterology;;   POSTERIOR LUMBAR FUSION  04/04/2005   @[facility]  by dr lucilla;   L3-- L5   POSTERIOR LUMBAR FUSION  03/04/2006   @[facility]  dr lucilla;   fusion L2--3 and redo L3--5   SHOULDER ARTHROSCOPY WITH ROTATOR CUFF REPAIR AND SUBACROMIAL DECOMPRESSION Left 05/07/2019   Procedure: LEFT SHOULDER ARTHROSCOPY WITH DEBRIDEMENT, DISTAL CLAVICLE EXCISION,  SUBACROMIAL DECOMPRESSION AND POSSIBLE ROTATOR CUFF REPAIR;  Surgeon: Jerri Kay HERO, MD;  Location: [facility];  Service: Orthopedics;  Laterality: Left;   TEE WITHOUT CARDIOVERSION N/A 04/06/2012   Procedure: TRANSESOPHAGEAL ECHOCARDIOGRAM (TEE);  Surgeon: Vinie KYM Maxcy, MD;  Location: [facility];  Service: Cardiovascular;  Laterality: N/A;   TRIGGER FINGER RELEASE Right    middle finger   TRIGGER FINGER RELEASE Left 06/28/2016   Procedure: RELEASE TRIGGER FINGER LEFT 3RD FINGER;  Surgeon: Jerri Kay HERO,  MD;  [facility]: [facility];  Service: Orthopedics;  Laterality: Left;   TRIGGER FINGER RELEASE Right 06/12/2016   Procedure: RIGHT INDEX FINGER TRIGGER RELEASE;  Surgeon: Jerri Kay HERO, MD;  Location: [facility];  Service: Orthopedics;  Laterality: Right;   TRIGGER FINGER RELEASE Right 01/19/2018   Procedure: RIGHT RING FINGER TRIGGER FINGER RELEASE;  Surgeon: Jerri Kay HERO, MD;  Location: [facility];  Service: Orthopedics;  Laterality: Right;   TRIGGER FINGER RELEASE Left 05/07/2019   Procedure: RELEASE TRIGGER FINGER LEFT INDEX FINGER;  Surgeon: Jerri Kay HERO, MD;  [facility]: [facility];  Service: Orthopedics;  Laterality: Left;   UPPER GI ENDOSCOPY N/A 05/08/2020   Procedure: UPPER GI ENDOSCOPY;  Surgeon: Stevie, Herlene Righter, MD;  Location: [facility];  Service: General;  Laterality: N/A;   VIDEO BRONCHOSCOPY WITH ENDOBRONCHIAL NAVIGATION N/A 10/30/2023   Procedure: VIDEO BRONCHOSCOPY WITH ENDOBRONCHIAL NAVIGATION;  Surgeon: Zaida Zola SAILOR, MD;  Location: [facility];  Service: Pulmonary;  Laterality: N/A;   HPI:  NISHITA ISAACKS is an 58 y.o. female past medical history significant for  HFpEF, chronic kidney disease stage III, seizures,  diabetes mellitus type 2 admitted with severe sepsis due to UTI complicated by hyperkalemia, DKA, A-fib, GERD.  Pt has AMS, was unable to eat/drink, twitching and tachycardia/tachypnea - diagnosed with sepsis.  Chest imaging revealed ground glass opacities- possible edema.  Pt also with h/o ACDF, cervical myelopathy and gastric sleeve with revision in 2022 and has a RUL nodule.   She had labored breathing upon admit but has improved per RN.  She has been requiring oral suction to clear secretions at times.  Diagnosed with encephalopathy likely due to her sepsis.    Assessment / Plan / Recommendation  Clinical Impression  Patient presents with functional oropharyngeal swallow ability despite being slightly dyspneic.  Patient complaining of throat pain  and has cough - son stated cough  has been present for a few days prior to admission * Thursday*.  No s/s of aspiration across all po trials - but pt was encouraged to eat slowly - as her son stated she was having difficulty yesterday.  Upper dentures at home but adequate mastication and oral clearance with solids noted.  Subswallows consistent with all liquids - likely piecemealing. She denies having issues swallowing related to gastric sleeve or reflux. Recommend softer diet until teeth are brought - provided menu to family and advised they need to order ALL of her meals.   Pt denies refluxing currently but has h/o GERD - and takes a reflux medication PTA. Recommend  continue diet with general precautions.    No SLP follow up needed. SLP Visit Diagnosis: Dysphagia, unspecified (R13.10)    Aspiration Risk  Mild aspiration risk    Diet Recommendation Regular;Thin liquid (family to order SOFT FOODS until pt has her UPPER DENTURES)    Liquid Administration via: Cup;Straw Medication Administration: Other (Comment) (as toleratred) Supervision: Comment;Intermittent supervision to cue for compensatory strategies Compensations: Slow rate;Small sips/bites Postural Changes: Seated upright at 90 degrees;Remain upright for at least 30 minutes after po intake    Other  Recommendations Oral Care Recommendations: Oral care BID     Assistance Recommended at Discharge  N/a  Functional Status Assessment Patient has not had a recent decline in their functional status  Frequency and Duration     N/a       Prognosis   N/a     Swallow Study   General Date of Onset: 11/22/23 HPI: SHANDI GODFREY is an 58 y.o. female past medical history significant for  HFpEF, chronic kidney disease stage III, seizures,  diabetes mellitus type 2 admitted with severe sepsis due to UTI complicated by hyperkalemia, DKA, A-fib, GERD.  Pt has AMS, was unable to eat/drink, twitching and tachycardia/tachypnea - diagnosed with sepsis.  Chest imaging revealed ground  glass opacities- possible edema.  Pt also with h/o ACDF, cervical myelopathy and gastric sleeve with revision in 2022 and has a RUL nodule.   She had labored breathing upon admit but has improved per RN.  She has been requiring oral suction to clear secretions at times.  Diagnosed with encephalopathy likely due to her sepsis. Diet Prior to this Study: NPO Temperature Spikes Noted: No Respiratory Status: Nasal cannula History of Recent Intubation: No Behavior/Cognition: Alert;Agitated;Distractible Oral Cavity Assessment: Dry Oral Care Completed by SLP: Yes Oral Cavity - Dentition: Other (Comment) (missing upper dentures - at home) Vision: Functional for self-feeding Self-Feeding Abilities: Needs assist Patient Positioning: Upright in bed Baseline Vocal Quality: Normal Volitional Cough: Cognitively unable to elicit Volitional Swallow: Able to elicit    Oral/Motor/Sensory Function Overall Oral Motor/Sensory Function: Generalized oral weakness   Ice Chips Ice chips: Not tested   Thin Liquid Thin Liquid: Within functional limits Presentation: Cup;Straw;Self Fed    Nectar Thick Nectar Thick Liquid: Not tested   Honey Thick Honey Thick Liquid: Not tested   Puree Puree: Within functional limits Presentation: Self Fed;Spoon   Solid     Solid: Impaired Presentation: Self Fed Oral Phase Impairments: Impaired mastication Oral Phase Functional Implications: Impaired mastication      Nicolas Emmie Caldron 11/22/2023,4:19 PM  Madelin POUR, MS [facility] SLP Acute Rehab Services Office [phone]

## 2023-11-22 NOTE — Progress Notes (Signed)
   11/22/23 2234  BiPAP/CPAP/SIPAP  BiPAP/CPAP/SIPAP Pt Type Adult  BiPAP/CPAP/SIPAP Resmed  Mask Type Full face mask  Dentures removed? Not applicable  Mask Size Medium  Respiratory Rate 20 breaths/min  EPAP 16 cmH2O  Pressure Support 3 cmH20  Flow Rate 2 lpm  Patient Home Machine No  Patient Home Mask No  Patient Home Tubing No  Auto Titrate No  Device Plugged into RED Power Outlet Yes  BiPAP/CPAP /SiPAP Vitals  Pulse Rate 77  Resp (!) 21  SpO2 96 %  MEWS Score/Color  MEWS Score 1  MEWS Score Color Green

## 2023-11-22 NOTE — Progress Notes (Signed)
 Echocardiogram 2D Echocardiogram has been performed.  Damien FALCON Cashe Gatt RDCS 11/22/2023, 12:55 PM

## 2023-11-22 NOTE — Consult Note (Signed)
 WOC Nurse Consult Note: Reason for Consult: Ordered evaluation of sacral injury. Performed remotely evaluation of photos ans notes. Wound type: PI stage 3 on Sacrum Pressure Injury POA: Yes Measurement: aprox. 5 cm x 2.5 cm. Multiple small wounds. Wound bed: 100% red. Granulate tissue. Drainage (amount, consistency, odor)  Periwound: dry skin, scabs. Dressing procedure/placement/frequency: Cleanse with saline, pat dry the peri-wound skin. Apply Xeroform daily to the wound bed, top with Sacral foam dressing. Ok to lift and reapply the Xeroform. The foam can stay up to 3 days if not saturated or soiling.  WOC team will not plan to follow further. Please reconsult if further assistance is needed. Thank-you,  Lela Holm RN, CNS, [employer], MSN.  (Phone [phone])

## 2023-11-22 NOTE — Progress Notes (Signed)
 PHARMACY NOTE:  ANTIMICROBIAL RENAL DOSAGE ADJUSTMENT  Current antimicrobial regimen includes a mismatch between antimicrobial dosage and estimated renal function.  As per policy approved by the Pharmacy & Therapeutics and Medical Executive Committees, the antimicrobial dosage will be adjusted accordingly.  Current antimicrobial dosage:  vancomycin  1000mg  IV q24h  Indication: sepsis  Renal Function:  Estimated Creatinine Clearance: 69.1 mL/min (A) (by C-G formula based on SCr of 1.08 mg/dL (H)). []      On intermittent HD, scheduled: []      On CRRT    Antimicrobial dosage has been changed to:  vancomycin  1250mg  IV q24h (eAUC 485 using Scr 1.08, Vd 0.5)  Additional comments: N/A   Thank you for allowing pharmacy to be a part of this patient's care.  Lacinda Moats, PharmD Clinical Pharmacist  10/25/20257:19 AM

## 2023-11-23 DIAGNOSIS — I4891 Unspecified atrial fibrillation: Secondary | ICD-10-CM | POA: Diagnosis not present

## 2023-11-23 DIAGNOSIS — A419 Sepsis, unspecified organism: Secondary | ICD-10-CM | POA: Diagnosis not present

## 2023-11-23 DIAGNOSIS — E1011 Type 1 diabetes mellitus with ketoacidosis with coma: Secondary | ICD-10-CM | POA: Diagnosis not present

## 2023-11-23 LAB — GLUCOSE, CAPILLARY
Glucose-Capillary: 126 mg/dL — ABNORMAL HIGH (ref 70–99)
Glucose-Capillary: 202 mg/dL — ABNORMAL HIGH (ref 70–99)
Glucose-Capillary: 219 mg/dL — ABNORMAL HIGH (ref 70–99)
Glucose-Capillary: 333 mg/dL — ABNORMAL HIGH (ref 70–99)
Glucose-Capillary: 99 mg/dL (ref 70–99)

## 2023-11-23 LAB — BLOOD CULTURE ID PANEL (REFLEXED) - BCID2

## 2023-11-23 LAB — URINE CULTURE: Culture: >=100000 — AB

## 2023-11-23 LAB — CBC
HCT: 48.9 % — ABNORMAL HIGH (ref 36.0–46.0)
Hemoglobin: 16.7 g/dL — ABNORMAL HIGH (ref 12.0–15.0)
MCH: 28.7 pg (ref 26.0–34.0)
MCHC: 34.2 g/dL (ref 30.0–36.0)
MCV: 84.2 fL (ref 80.0–100.0)
Platelets: 275 K/uL (ref 150–400)
RBC: 5.81 MIL/uL — ABNORMAL HIGH (ref 3.87–5.11)
RDW: 13.2 % (ref 11.5–15.5)
WBC: 15.2 K/uL — ABNORMAL HIGH (ref 4.0–10.5)
nRBC: 0.1 % (ref 0.0–0.2)

## 2023-11-23 MED ORDER — DILTIAZEM HCL ER COATED BEADS 120 MG PO CP24
120.0000 mg | ORAL_CAPSULE | Freq: Two times a day (BID) | ORAL | Status: DC
  Administered 2023-11-23 – 2023-11-24 (×3): 120 mg via ORAL
  Filled 2023-11-23 (×3): qty 1

## 2023-11-23 MED ORDER — ATORVASTATIN CALCIUM 40 MG PO TABS
40.0000 mg | ORAL_TABLET | Freq: Every day | ORAL | Status: DC
  Administered 2023-11-23 – 2023-11-24 (×2): 40 mg via ORAL
  Filled 2023-11-23 (×2): qty 1

## 2023-11-23 MED ORDER — INSULIN GLARGINE-YFGN 100 UNIT/ML ~~LOC~~ SOLN
35.0000 [IU] | Freq: Two times a day (BID) | SUBCUTANEOUS | Status: DC
  Administered 2023-11-23: 35 [IU] via SUBCUTANEOUS
  Filled 2023-11-23 (×3): qty 0.35

## 2023-11-23 MED ORDER — SALINE SPRAY 0.65 % NA SOLN: 1.0000 | NASAL | Status: DC | PRN

## 2023-11-23 MED ORDER — FUROSEMIDE 40 MG PO TABS
40.0000 mg | ORAL_TABLET | Freq: Two times a day (BID) | ORAL | Status: DC
  Administered 2023-11-23 – 2023-11-24 (×3): 40 mg via ORAL
  Filled 2023-11-23 (×3): qty 1

## 2023-11-23 MED ORDER — APIXABAN 5 MG PO TABS
5.0000 mg | ORAL_TABLET | Freq: Two times a day (BID) | ORAL | Status: DC
  Administered 2023-11-23 (×2): 5 mg via ORAL
  Filled 2023-11-23 (×3): qty 1

## 2023-11-23 MED ORDER — MONTELUKAST SODIUM 10 MG PO TABS
10.0000 mg | ORAL_TABLET | Freq: Every day | ORAL | Status: DC
  Administered 2023-11-23: 10 mg via ORAL
  Filled 2023-11-23: qty 1

## 2023-11-23 MED ORDER — VENLAFAXINE HCL ER 75 MG PO CP24
75.0000 mg | ORAL_CAPSULE | Freq: Every day | ORAL | Status: DC
  Administered 2023-11-23 – 2023-11-24 (×2): 75 mg via ORAL
  Filled 2023-11-23 (×3): qty 1

## 2023-11-23 NOTE — Plan of Care (Signed)
  Problem: Education: Goal: Knowledge of General Education information will improve Description: Including pain rating scale, medication(s)/side effects and non-pharmacologic comfort measures Outcome: Progressing   Problem: Health Behavior/Discharge Planning: Goal: Ability to manage health-related needs will improve Outcome: Progressing   Problem: Clinical Measurements: Goal: Ability to maintain clinical measurements within normal limits will improve Outcome: Progressing Goal: Will remain free from infection Outcome: Progressing Goal: Diagnostic test results will improve Outcome: Progressing Goal: Respiratory complications will improve Outcome: Progressing Goal: Cardiovascular complication will be avoided Outcome: Progressing   Problem: Activity: Goal: Risk for activity intolerance will decrease Outcome: Progressing   Problem: Nutrition: Goal: Adequate nutrition will be maintained Outcome: Progressing   Problem: Coping: Goal: Level of anxiety will decrease Outcome: Progressing   Problem: Elimination: Goal: Will not experience complications related to bowel motility Outcome: Progressing Goal: Will not experience complications related to urinary retention Outcome: Progressing   Problem: Pain Managment: Goal: General experience of comfort will improve and/or be controlled Outcome: Progressing   Problem: Safety: Goal: Ability to remain free from injury will improve Outcome: Progressing   Problem: Skin Integrity: Goal: Risk for impaired skin integrity will decrease Outcome: Progressing   Problem: Coping: Goal: Ability to adjust to condition or change in health will improve Outcome: Progressing   Problem: Fluid Volume: Goal: Ability to maintain a balanced intake and output will improve Outcome: Progressing   Problem: Metabolic: Goal: Ability to maintain appropriate glucose levels will improve Outcome: Progressing   Problem: Nutritional: Goal: Maintenance of  adequate nutrition will improve Outcome: Progressing   Problem: Skin Integrity: Goal: Risk for impaired skin integrity will decrease Outcome: Progressing   Problem: Tissue Perfusion: Goal: Adequacy of tissue perfusion will improve Outcome: Progressing  Cindy S. Loreli BSN, RN, [facility], CCRN 11/23/2023 3:38 AM

## 2023-11-23 NOTE — Evaluation (Signed)
 Occupational Therapy Evaluation Patient Details Name: [name] MRN: [id number] DOB: 05/19/1965 Today's Date: 11/23/2023   History of Present Illness   [name] is an 58 y.o. female   admitted 11/21/23 with severe sepsis due to UTI complicated by hyperkalemia, DKA and A-fib with RVR, acute metabolic encephalopathy.  PMH: bipolar disorder, HFpEF, chronic kidney disease stage III, diabetes mellitus type 2, essential hypertension.     Clinical Impressions PTA, pt lives alone, typically Modified Independent with ADLs, household IADLs and mobility using Rollator. Pt presents now with minor deficits in endurance, strength and dynamic standing balance. Pt eager to mobilize OOB today and reports no pain. Pt able to manage bed mobility with Min A and mobilize in hallway > 144ft using RW. Pt requires Setup for UB ADL and no more than Min A for LB ADLs. Pt reports recently starting HHPT/OT at home and would like to continue with this upon DC; feel HH therapies are at an appropriate level for pt.  HR to 132bpm with activity     If plan is discharge home, recommend the following:   Assistance with cooking/housework;A little help with bathing/dressing/bathroom;Help with stairs or ramp for entrance     Functional Status Assessment   Patient has had a recent decline in their functional status and demonstrates the ability to make significant improvements in function in a reasonable and predictable amount of time.     Equipment Recommendations   None recommended by OT     Recommendations for Other Services         Precautions/Restrictions   Precautions Precautions: Fall Precaution/Restrictions Comments: monitor HR Restrictions Weight Bearing Restrictions Per Provider Order: No     Mobility Bed Mobility Overal bed mobility: Needs Assistance Bed Mobility: Supine to Sit     Supine to sit: Min assist, HOB elevated, Used rails     General bed mobility comments: some  assist to lift trunk, use of bedrails    Transfers Overall transfer level: Needs assistance Equipment used: Rolling walker (2 wheels) Transfers: Sit to/from Stand, Bed to chair/wheelchair/BSC Sit to Stand: Contact guard assist                  Balance Overall balance assessment: Needs assistance Sitting-balance support: No upper extremity supported, Feet supported Sitting balance-Leahy Scale: Fair     Standing balance support: Bilateral upper extremity supported, During functional activity, No upper extremity supported Standing balance-Leahy Scale: Fair                             ADL either performed or assessed with clinical judgement   ADL Overall ADL's : Needs assistance/impaired Eating/Feeding: Independent   Grooming: Set up;Sitting   Upper Body Bathing: Set up;Sitting   Lower Body Bathing: Contact guard assist;Sitting/lateral leans;Sit to/from stand   Upper Body Dressing : Set up;Sitting   Lower Body Dressing: Minimal assistance;Sitting/lateral leans;Sit to/from stand   Toilet Transfer: Contact guard assist;Ambulation;Rolling walker (2 wheels)   Toileting- Clothing Manipulation and Hygiene: Contact guard assist;Sit to/from stand;Sitting/lateral lean       Functional mobility during ADLs: Contact guard assist;Rolling walker (2 wheels) General ADL Comments: able to progress out in hallway for mobility using RW. Chair follow for safety though no overt LOB or abnormal symptoms noted     Vision Ability to See in Adequate Light: 0 Adequate Patient Visual Report: No change from baseline Vision Assessment?: No apparent visual deficits  Perception         Praxis         Pertinent Vitals/Pain Pain Assessment Pain Assessment: No/denies pain     Extremity/Trunk Assessment Upper Extremity Assessment Upper Extremity Assessment: Right hand dominant;RUE deficits/detail;LUE deficits/detail RUE Deficits / Details: hx of B rotator cuff  injuries LUE Deficits / Details: hx of B rotator cuff injuries, able to lift this UE higher   Lower Extremity Assessment Lower Extremity Assessment: Defer to PT evaluation   Cervical / Trunk Assessment Cervical / Trunk Assessment: Normal   Communication Communication Communication: Impaired Factors Affecting Communication: Reduced clarity of speech   Cognition Arousal: Alert Behavior During Therapy: WFL for tasks assessed/performed Cognition: No apparent impairments                               Following commands: Intact       Cueing  General Comments   Cueing Techniques: Verbal cues;Tactile cues      Exercises     Shoulder Instructions      Home Living Family/patient expects to be discharged to:: Private residence Living Arrangements: Alone Available Help at Discharge: Family;Available PRN/intermittently Type of Home: House Home Access: Stairs to enter [employer] of Steps: 4 Entrance Stairs-Rails: Right;Left Home Layout: One level     Bathroom Shower/Tub: Chief Strategy Officer: Standard Bathroom Accessibility: Yes How Accessible: Accessible via walker Home Equipment: Standard Walker;Rollator (4 wheels);Shower seat - built in          Prior Functioning/Environment Prior Level of Function : Independent/Modified Independent             Mobility Comments: uses rollator ADLs Comments: Mod I for ADLs, household IADLs. orders groceries online    OT Problem List: Decreased strength;Decreased activity tolerance;Impaired balance (sitting and/or standing)   OT Treatment/Interventions: Self-care/ADL training;Therapeutic exercise;Energy conservation;DME and/or AE instruction;Therapeutic activities;Patient/family education;Balance training      OT Goals(Current goals can be found in the care plan section)   Acute Rehab OT Goals Patient Stated Goal: return home soon OT Goal Formulation: With patient Time For Goal  Achievement: 12/07/23 Potential to Achieve Goals: Good   OT Frequency:  Min 2X/week    Co-evaluation              AM-PAC OT 6 Clicks Daily Activity     Outcome Measure Help from another person eating meals?: None Help from another person taking care of personal grooming?: A Little Help from another person toileting, which includes using toliet, bedpan, or urinal?: A Little Help from another person bathing (including washing, rinsing, drying)?: A Little Help from another person to put on and taking off regular upper body clothing?: A Little Help from another person to put on and taking off regular lower body clothing?: A Little 6 Click Score: 19   End of Session Equipment Utilized During Treatment: Gait belt;Rolling walker (2 wheels) Nurse Communication: Mobility status  Activity Tolerance: Patient tolerated treatment well Patient left: in chair;with call bell/phone within reach;with chair alarm set  OT Visit Diagnosis: Unsteadiness on feet (R26.81);Other abnormalities of gait and mobility (R26.89);Muscle weakness (generalized) (M62.81)                Time: 8755-8690 OT Time Calculation (min): 25 min Charges:  OT General Charges $OT Visit: 1 Visit OT Evaluation $OT Eval Moderate Complexity: 1 Mod OT Treatments $Therapeutic Activity: 8-22 mins  Mliss NOVAK, OTR/L Acute Rehab Services Office: [phone]  Mliss Fish 11/23/2023, 1:57 PM

## 2023-11-23 NOTE — Progress Notes (Signed)
 [facility] PROGRESS NOTE    Progress Note  [name]  FMW:[id number] DOB: 10/16/1965 DOA: 11/21/2023 PCP: Elicia Hamlet, MD     Brief Narrative:   [name] is an 58 y.o. female past medical history significant for bipolar disorder, HFpEF, chronic kidney disease stage III, diabetes mellitus type 2 admitted with severe sepsis due to UTI complicated by hyperkalemia, DKA and A-fib with RVR.  Assessment/Plan:   Severe sepsis of unclear source possibly UTI: Patient was hemodynamically stable in the setting of sepsis and DKA. KVO IV fluids continue strict I's and O's and daily weights. KVO IV fluids Urine culture grew Klebsiella pneumonia close blood cultures remain negative till date. Continue  Rocephin .  Discontinue IV vancomycin . Transferred to telemetry. Patient relates she was going home today I told her I cannot discharge her but she is in her right to leave AGAINST MEDICAL ADVICE.  DKA (diabetic ketoacidosis)/ Type 2 diabetes mellitus with stage 3 chronic kidney disease, with long-term current use of insulin  : Last A1c of 5.9 in 2024. Transition to long-acting insulin  per sliding scale but glucose remained high. Increase long-acting insulin . Allow a diet continue sliding scale insulin  CBG's AC and at bedtime  New onset A-fib with RVR: Patient oral diltiazem  and Coreg .  Discontinue IV diltiazem  Check a twelve-lead EKG 2D echo showed an EF of 70% no regional wall motion abnormalities possible diastolic dysfunction. Start Eliquis.  Hyperkalemia: With peaked T waves she received calcium  gluconate and Lokelma in the ER. Now resolved.  Acute metabolic encephalopathy: Multifactorial likely due to infectious etiology possibly due to DKA. CT of the head showed no acute findings that showed chronic microvascular ischemic changes. Now resolved back to baseline this morning.  Acute kidney injury on chronic kidney disease stage IIIb: With a baseline creatinine of  around 0.6-0.9.  On admission 1.3. The setting ACE inhibitor Lasix , Aldactone  sepsis and DKA KVO IV fluids creatinine has returned to baseline. Resume Lasix .  Essential hypertension Antihypertensive home regimen was held as blood pressure was borderline. Continue Coreg  Lasix  and diltiazem . Blood pressure is borderline hold ARB.  Small boil in the groin area: Currently in drainage, continue IV vancomycin .  Unlikely source of infection.  Present on admission pressure injury of skin sacrum stage II: Consult wound care. Start working with physical therapy   DVT prophylaxis: Lovenox  Family Communication:none Status is: Inpatient Remains inpatient appropriate because: Sepsis    Code Status:     Code Status Orders  (From admission, onward)           Start     Ordered   11/21/23 1642  Full code  Continuous       Question:  By:  Answer:  Consent: discussion documented in EHR   11/21/23 1647           Code Status History     Date Active Date Inactive Code Status Order ID Comments User Context   09/09/2022 0433 09/14/2022 0147 Full Code [id number]  Emilio Norleen BIRCH, PA-C ED   09/09/2022 0214 09/09/2022 0433 Full Code [id number]  Rosendo Norleen, MD ED   07/10/2022 1457 07/14/2022 1912 Full Code [id number]  Celinda Alm Lot, MD ED   06/09/2022 2052 06/14/2022 2209 Full Code [id number]  Izella Ismael NOVAK, MD ED   06/29/2021 1406 07/02/2021 2103 Full Code [id number]  Louis Shove, MD Inpatient   10/03/2020 1508 10/10/2020 1828 Full Code [id number]  Lucilla Lynwood BRAVO, MD Inpatient   05/08/2020 1617 05/10/2020 1724 Full Code [id number]  Kinsinger, Herlene Righter, MD Inpatient   09/24/2018 2302 09/25/2018 1917 Full Code [id number]  Ilean Sensing, MD ED   09/01/2018 1336 09/02/2018 0307 Full Code [id number]  Derrill Dawn, MD HOV   09/30/2017 1841 10/04/2017 2122 Full Code [id number]  Shirley, Jordan, DO Inpatient   12/17/2016 0147 12/20/2016 2139 Full Code [id number]  Sebastian Righter NOVAK, MD ED   12/12/2016 1840 12/13/2016  1833 Full Code [id number]  Lanny Maxwell, MD ED         IV Access:   Peripheral IV   Procedures and diagnostic studies:   ECHOCARDIOGRAM COMPLETE Result Date: 11/22/2023    ECHOCARDIOGRAM REPORT   Patient Name:   [name] Date of Exam: 11/22/2023 Medical Rec #:  [id number]       Height:       69.0 in Accession #:    [id number]      Weight:       216.3 lb Date of Birth:  [birth year]       BSA:          2.136 m Patient Age:    58 years        BP:           122/90 mmHg Patient Gender: F               HR:           91 bpm. Exam Location:  Inpatient Procedure: 2D Echo, Color Doppler, Cardiac Doppler and Intracardiac            Opacification Agent (Both Spectral and Color Flow Doppler were            utilized during procedure). Indications:    I48.91* Unspecified atrial fibrillation  History:        Patient has prior history of Echocardiogram examinations, most                 recent 06/10/2022. CHF, COPD, Arrythmias:Atrial Fibrillation;                 Risk Factors:Diabetes, Hypertension, Dyslipidemia and Sleep                 Apnea.  Sonographer:    Damien Senior RDCS Referring Phys: [id number] MIR M [facility]  Sonographer Comments: Technically difficult study due to poor echo windows. Technically difficult study due to very poor echo windows. IMPRESSIONS  1. There is mid-cavity acceleration up to , patient unable to augment with Valsalva. Suspect this is related to hyperdynamic LV function. Left ventricular ejection fraction, by estimation, is 70 to 75%. The left ventricle has hyperdynamic function. The left ventricle has no regional wall motion abnormalities. There is mild concentric left ventricular hypertrophy. Left ventricular diastolic parameters are indeterminate.  2. Right ventricular systolic function is normal. The right ventricular size is mildly enlarged. Tricuspid regurgitation signal is inadequate for assessing PA pressure.  3. Left atrial size was moderately dilated.  4. Right atrial  size was moderately dilated.  5. The mitral valve is grossly normal. No evidence of mitral valve regurgitation.  6. The aortic valve was not well visualized. Aortic valve regurgitation is not visualized.  7. The inferior vena cava is normal in size with greater than 50% respiratory variability, suggesting right atrial pressure of 3 mmHg. FINDINGS  Left Ventricle: There is mid-cavity acceleration up to , patient unable to augment with Valsalva. Suspect this is related to hyperdynamic LV function. Left ventricular ejection fraction, by estimation, is 70 to 75%. The  left ventricle has hyperdynamic function. The left ventricle has no regional wall motion abnormalities. Definity  contrast agent was given IV to delineate the left ventricular endocardial borders. The left ventricular internal cavity size was normal in size. There is mild concentric left ventricular hypertrophy. Left ventricular diastolic function could not be evaluated due to atrial fibrillation. Left ventricular diastolic parameters are indeterminate. Right Ventricle: The right ventricular size is mildly enlarged. No increase in right ventricular wall thickness. Right ventricular systolic function is normal. Tricuspid regurgitation signal is inadequate for assessing PA pressure. Left Atrium: Left atrial size was moderately dilated. Right Atrium: Right atrial size was moderately dilated. Pericardium: There is no evidence of pericardial effusion. Presence of epicardial fat layer. Mitral Valve: The mitral valve is grossly normal. No evidence of mitral valve regurgitation. Tricuspid Valve: The tricuspid valve is grossly normal. Tricuspid valve regurgitation is trivial. No evidence of tricuspid stenosis. Aortic Valve: The aortic valve was not well visualized. Aortic valve regurgitation is not visualized. Pulmonic Valve: The pulmonic valve was not well visualized. Pulmonic valve regurgitation is not visualized. Aorta: The aortic root is normal in size and  structure and the ascending aorta was not well visualized. Venous: The inferior vena cava is normal in size with greater than 50% respiratory variability, suggesting right atrial pressure of 3 mmHg. IAS/Shunts: No atrial level shunt detected by color flow Doppler.  LEFT VENTRICLE PLAX 2D LVIDd:         3.90 cm LVIDs:         2.50 cm LV PW:         1.10 cm LV IVS:        1.10 cm LVOT diam:     1.90 cm LV SV:         43 LV SV Index:   20 LVOT Area:     2.84 cm  LEFT ATRIUM           Index        RIGHT ATRIUM           Index LA diam:      2.80 cm 1.31 cm/m   RA Area:     25.90 cm LA Vol (A4C): 87.1 ml 40.78 ml/m  RA Volume:   84.60 ml  39.61 ml/m  AORTIC VALVE LVOT Vmax:   83.60 cm/s LVOT Vmean:  63.600 cm/s LVOT VTI:    0.151 m  AORTA Ao Root diam: 3.20 cm  SHUNTS Systemic VTI:  0.15 m Systemic Diam: 1.90 cm Darryle Decent MD Electronically signed by Darryle Decent MD Signature Date/Time: 11/22/2023/12:51:25 PM    Final    DG CHEST PORT 1 VIEW Result Date: 11/21/2023 CLINICAL DATA:  Shortness of breath EXAM: PORTABLE CHEST 1 VIEW COMPARISON:  11/21/2023, 10/30/2023, 09/09/2022 FINDINGS: Hardware in the cervical spine. Hypoventilatory change. Cardiomegaly with vascular congestion and diffuse interstitial and ground-glass opacity. No pleural effusion. IMPRESSION: Cardiomegaly with vascular congestion and diffuse interstitial and ground-glass opacity, possible edema Electronically Signed   By: Luke Bun M.D.   On: 11/21/2023 22:05   DG Chest Port 1 View Result Date: 11/21/2023 EXAM: 1 VIEW(S) XRAY OF THE CHEST 11/21/2023 11:28:00 AM COMPARISON: 10/30/2023 CLINICAL HISTORY: sob. Son called EMS due to pt being altered, twitching, and confused. Weakness beginning Wednesday, that has worsened. PT reported LEFT ear pain beginning Wednesday, and RIGHT ear pain beginning Thursday. Pt states that she has been unable to eat ; Hx of CHF, asthma, COPD, HTN, DM, PSVT, smoker ; Best obtainable image due  to pt mental  status, was held by a technologist for imaging FINDINGS: LUNGS AND PLEURA: No focal pulmonary opacity. No pulmonary edema. No pleural effusion. No pneumothorax. HEART AND MEDIASTINUM: Similar cardiomegaly. No acute abnormality of the mediastinal silhouette. BONES AND SOFT TISSUES: Intact cervical spinal fixation hardware. No acute osseous abnormality. IMPRESSION: 1. No acute cardiopulmonary process. Electronically signed by: Norleen Boxer MD 11/21/2023 12:12 PM EDT RP Workstation: [id number]   CT Head Wo Contrast Result Date: 11/21/2023 EXAM: CT HEAD WITHOUT CONTRAST 11/21/2023 11:42:19 AM TECHNIQUE: CT of the head was performed without the administration of intravenous contrast. Automated exposure control, iterative reconstruction, and/or weight based adjustment of the mA/kV was utilized to reduce the radiation dose to as low as reasonably achievable. COMPARISON: CT head 09/08/2022. CLINICAL HISTORY: Mental status change, unknown cause. Table formatting from the original note was not included.; Pt arrives via EMS from home. Son called EMS due to pt being altered, twitching, and confused. EMS notes tachypnea and tachycardia. Pt alert, but drifts off to sleep between questions. ; ; 10:40 AM; Upon arrival of son at bedside: Weakness beginning Wednesday, that has worsened. PT reported LEFT ear pain beginning Wednesday, and RIGHT ear pain beginning Thursday. Pt states that she has been unable to eat. FINDINGS: BRAIN AND VENTRICLES: No acute hemorrhage. No evidence of acute infarct. No hydrocephalus. No extra-axial collection. No mass effect or midline shift. Dural calcifications. Nonspecific hypoattenuation in the periventricular and subcortical white matter, most likely representing chronic microvascular ischemic changes. Similar appearance of the vasculature of the skull base. ORBITS: Bilateral lens replacement. SINUSES: No acute abnormality. SOFT TISSUES AND SKULL: No acute soft tissue abnormality. No skull  fracture. IMPRESSION: 1. No acute intracranial abnormality. 2. Chronic microvascular ischemic changes. Electronically signed by: Donnice Mania MD 11/21/2023 11:51 AM EDT RP Workstation: [id number]     Medical Consultants:   None.   Subjective:    [name] awake today demanding to go home very unrealistic  Objective:    Vitals:   11/23/23 0135 11/23/23 0200 11/23/23 0300 11/23/23 0403  BP:  138/70 132/61 129/65  Pulse: 96 93 88 91  Resp: (!) 23 (!) 21 (!) 21 (!) 22  Temp:      TempSrc:      SpO2: 95% 95% 91% 93%  Weight:      Height:       SpO2: 93 % O2 Flow Rate (L/min): 2 L/min   Intake/Output Summary (Last 24 hours) at 11/23/2023 0602 Last data filed at 11/23/2023 0600 Gross per 24 hour  Intake 1827.07 ml  Output 1600 ml  Net 227.07 ml   Filed Weights   11/21/23 1800  Weight: 98.1 kg    Exam: General exam: In no acute distress. Respiratory system: Good air movement and clear to auscultation. Cardiovascular system: S1 & S2 heard, RRR. No JVD. Gastrointestinal system: Abdomen is nondistended, soft and nontender.  Extremities: No pedal edema. Skin: No rashes, lesions or ulcers Psychiatry: Judgement and insight appear normal. Mood & affect appropriate.   Data Reviewed:    Labs: Basic Metabolic Panel: Recent Labs  Lab 11/21/23 1344 11/21/23 1402 11/21/23 2059 11/22/23 0102 11/22/23 0516 11/22/23 0814 11/22/23 1817  NA 137   < > 142 142 141 142 139  K 6.4*   < > 5.2* 4.6 4.6 4.6 4.1  CL 98   < > 106 108 109 109 107  CO2 10*   < > 20* 21* 20* 16* 19*  GLUCOSE 468*   < >  233* 163* 167* 160* 241*  BUN 42*   < > 39* 37* 37* 36* 30*  CREATININE 1.52*   < > 1.28* 1.11* 1.08* 0.99 0.92  CALCIUM  9.9   < > 9.6 9.6 9.6 9.5 9.4  MG 3.2*  --   --   --   --   --   --    < > = values in this interval not displayed.   GFR Estimated Creatinine Clearance: 81.1 mL/min (by C-G formula based on SCr of 0.92 mg/dL). Liver Function Tests: Recent Labs  Lab  11/21/23 1344  AST 10*  ALT 14  ALKPHOS 172*  BILITOT 0.3  PROT 7.8  ALBUMIN  3.7   No results for input(s): LIPASE, AMYLASE in the last 168 hours. Recent Labs  Lab 11/21/23 1149  AMMONIA 26   Coagulation profile No results for input(s): INR, PROTIME in the last 168 hours. COVID-19 Labs  No results for input(s): DDIMER, FERRITIN, LDH, CRP in the last 72 hours.  Lab Results  Component Value Date   SARSCOV2NAA NEGATIVE 11/21/2023   SARSCOV2NAA RESULT: NEGATIVE 09/29/2020   SARSCOV2NAA NEGATIVE 05/04/2020   SARSCOV2NAA NEGATIVE 07/23/2019    CBC: Recent Labs  Lab 11/21/23 1149 11/21/23 1402 11/22/23 0516  WBC 23.8*  --  18.2*  NEUTROABS 18.9*  --   --   HGB 18.4* 17.0* 16.8*  HCT 55.0* 50.0* 48.1*  MCV 85.3  --  83.2  PLT 371  --  346   Cardiac Enzymes: No results for input(s): CKTOTAL, CKMB, CKMBINDEX, TROPONINI in the last 168 hours. BNP (last 3 results) Recent Labs    11/22/23 0516  PROBNP 2,137.0*   CBG: Recent Labs  Lab 11/22/23 0926 11/22/23 1143 11/22/23 1607 11/22/23 1955 11/22/23 2245  GLUCAP 206* 256* 243* 259* 258*   D-Dimer: No results for input(s): DDIMER in the last 72 hours. Hgb A1c: Recent Labs    11/22/23 0814  HGBA1C 9.8*   Lipid Profile: No results for input(s): CHOL, HDL, LDLCALC, TRIG, CHOLHDL, LDLDIRECT in the last 72 hours. Thyroid  function studies: Recent Labs    11/21/23 1821  TSH <0.100*   Anemia work up: No results for input(s): VITAMINB12, FOLATE, FERRITIN, TIBC, IRON , RETICCTPCT in the last 72 hours. Sepsis Labs: Recent Labs  Lab 11/21/23 1149 11/21/23 1230 11/21/23 1357 11/22/23 0516  WBC 23.8*  --   --  18.2*  LATICACIDVEN  --  1.9 1.5  --    Microbiology Recent Results (from the past 240 hours)  Urine Culture     Status: Abnormal (Preliminary result)   Collection Time: 11/21/23 11:49 AM   Specimen: Urine, Clean Catch  Result Value Ref Range Status    Specimen Description   Final    URINE, CLEAN CATCH Performed at [facility], [address], [place], [place] [address]    Special Requests   Final    NONE Performed at [facility], [address] [place], [place] [address]    Culture (A)  Final    >=100,000 COLONIES/mL KLEBSIELLA PNEUMONIAE SUSCEPTIBILITIES TO FOLLOW Performed at [facility], [address], [place], [place] [address]    Report Status PENDING  Incomplete  Blood culture (routine x 2)     Status: None (Preliminary result)   Collection Time: 11/21/23  1:40 PM   Specimen: BLOOD  Result Value Ref Range Status   Specimen Description   Final    BLOOD BLOOD LEFT ARM Performed at [facility], [address], [place], [place] [address]    Special Requests   Final    BOTTLES DRAWN AEROBIC AND ANAEROBIC Blood Culture results may not be optimal due to an inadequate volume of blood received in culture bottles Performed at [facility], [address] [place], [place] [address]    Culture  Setup Time   Final    UNIDENTIFIED ORGANISM AEROBIC BOTTLE ONLY  L POINTDEXTER PHARM D 11/23/2023 @0408  BY DD    Culture   Final    NO GROWTH < 24 HOURS Performed at [facility], [address] [place], [place] [address]    Report Status PENDING  Incomplete  Blood Culture ID Panel (Reflexed)     Status: None   Collection Time: 11/21/23  1:40 PM  Result Value Ref Range Status   Enterococcus faecalis NOT DETECTED NOT DETECTED Final   Enterococcus Faecium NOT DETECTED NOT DETECTED Final   Listeria monocytogenes NOT DETECTED NOT DETECTED Final   Staphylococcus species NOT DETECTED NOT DETECTED Final   Staphylococcus aureus (BCID) NOT DETECTED NOT DETECTED Final   Staphylococcus epidermidis NOT DETECTED NOT DETECTED Final   Staphylococcus lugdunensis NOT DETECTED NOT DETECTED Final   Streptococcus species NOT DETECTED NOT DETECTED Final    Streptococcus agalactiae NOT DETECTED NOT DETECTED Final   Streptococcus pneumoniae NOT DETECTED NOT DETECTED Final   Streptococcus pyogenes NOT DETECTED NOT DETECTED Final   A.calcoaceticus-baumannii NOT DETECTED NOT DETECTED Final   Bacteroides fragilis NOT DETECTED NOT DETECTED Final   Enterobacterales NOT DETECTED NOT DETECTED Final   Enterobacter cloacae complex NOT DETECTED NOT DETECTED Final   Escherichia coli NOT DETECTED NOT DETECTED Final   Klebsiella aerogenes NOT DETECTED NOT DETECTED Final   Klebsiella oxytoca NOT DETECTED NOT DETECTED Final   Klebsiella pneumoniae NOT DETECTED NOT DETECTED Final   Proteus species NOT DETECTED NOT DETECTED Final   Salmonella species NOT DETECTED NOT DETECTED Final   Serratia marcescens NOT DETECTED NOT DETECTED Final   Haemophilus influenzae NOT DETECTED NOT DETECTED Final   Neisseria meningitidis NOT DETECTED NOT DETECTED Final   Pseudomonas aeruginosa NOT DETECTED NOT DETECTED Final   Stenotrophomonas maltophilia NOT DETECTED NOT DETECTED Final   Candida albicans NOT DETECTED NOT DETECTED Final   Candida auris NOT DETECTED NOT DETECTED Final   Candida glabrata NOT DETECTED NOT DETECTED Final   Candida krusei NOT DETECTED NOT DETECTED Final   Candida parapsilosis NOT DETECTED NOT DETECTED Final   Candida tropicalis NOT DETECTED NOT DETECTED Final   Cryptococcus neoformans/gattii NOT DETECTED NOT DETECTED Final    Comment: Performed at [facility], [address], [place], [place] [address]  Blood culture (routine x 2)     Status: None (Preliminary result)   Collection Time: 11/21/23  1:44 PM   Specimen: BLOOD  Result Value Ref Range Status   Specimen Description   Final    BLOOD BLOOD RIGHT ARM Performed at [facility], [address] [place], [place] [address]    Special Requests   Final    BOTTLES DRAWN AEROBIC AND ANAEROBIC Blood Culture adequate volume Performed at [facility], [address] [place], [place] [address]    Culture   Final    NO GROWTH < 24 HOURS Performed at [facility], [address], [place], [place] [address]    Report Status PENDING  Incomplete  Group A Strep by PCR     Status: None  Collection Time: 11/21/23  1:44 PM   Specimen: Peripheral; Sterile Swab  Result Value Ref Range Status   Group A Strep by PCR NOT DETECTED NOT DETECTED Final    Comment: Performed at [facility], [address] [place], [place] [address]  Resp panel by RT-PCR (RSV, Flu A&B, Covid) Anterior Nasal Swab     Status: None   Collection Time: 11/21/23  3:33 PM   Specimen: Anterior Nasal Swab  Result Value Ref Range Status   SARS Coronavirus 2 by RT PCR NEGATIVE NEGATIVE Final    Comment: (NOTE) SARS-CoV-2 target nucleic acids are NOT DETECTED.  The SARS-CoV-2 RNA is generally detectable in upper respiratory specimens during the acute phase of infection. The lowest concentration of SARS-CoV-2 viral copies this assay can detect is 138 copies/mL. A negative result does not preclude SARS-Cov-2 infection and should not be used as the sole basis for treatment or other patient management decisions. A negative result may occur with  improper specimen collection/handling, submission of specimen other than nasopharyngeal swab, presence of viral mutation(s) within the areas targeted by this assay, and inadequate number of viral copies(<138 copies/mL). A negative result must be combined with clinical observations, patient history, and epidemiological information. The expected result is Negative.  Fact Sheet for Patients:  bloggercourse.com  Fact Sheet for Healthcare Providers:  seriousbroker.it  This test is no t yet approved or cleared by the [place]  FDA and  has been authorized for detection and/or diagnosis of SARS-CoV-2 by FDA under an Emergency Use Authorization (EUA). This EUA will  remain  in effect (meaning this test can be used) for the duration of the COVID-19 declaration under Section 564(b)(1) of the Act, 21 U.S.C.section 360bbb-3(b)(1), unless the authorization is terminated  or revoked sooner.       Influenza A by PCR NEGATIVE NEGATIVE Final   Influenza B by PCR NEGATIVE NEGATIVE Final    Comment: (NOTE) The Xpert Xpress SARS-CoV-2/FLU/RSV plus assay is intended as an aid in the diagnosis of influenza from Nasopharyngeal swab specimens and should not be used as a sole basis for treatment. Nasal washings and aspirates are unacceptable for Xpert Xpress SARS-CoV-2/FLU/RSV testing.  Fact Sheet for Patients: bloggercourse.com  Fact Sheet for Healthcare Providers: seriousbroker.it  This test is not yet approved or cleared by the [place]  FDA and has been authorized for detection and/or diagnosis of SARS-CoV-2 by FDA under an Emergency Use Authorization (EUA). This EUA will remain in effect (meaning this test can be used) for the duration of the COVID-19 declaration under Section 564(b)(1) of the Act, 21 U.S.C. section 360bbb-3(b)(1), unless the authorization is terminated or revoked.     Resp Syncytial Virus by PCR NEGATIVE NEGATIVE Final    Comment: (NOTE) Fact Sheet for Patients: bloggercourse.com  Fact Sheet for Healthcare Providers: seriousbroker.it  This test is not yet approved or cleared by the [place]  FDA and has been authorized for detection and/or diagnosis of SARS-CoV-2 by FDA under an Emergency Use Authorization (EUA). This EUA will remain in effect (meaning this test can be used) for the duration of the COVID-19 declaration under Section 564(b)(1) of the Act, 21 U.S.C. section 360bbb-3(b)(1), unless the authorization is terminated or revoked.  Performed at [facility], [address], [place], [place]  [address]   MRSA Next Gen by PCR, Nasal     Status: None   Collection Time: 11/21/23  6:26 PM   Specimen: Nasal Mucosa; Nasal Swab  Result Value Ref  Range Status   MRSA by PCR Next Gen NOT DETECTED NOT DETECTED Final    Comment: (NOTE) The GeneXpert MRSA Assay (FDA approved for NASAL specimens only), is one component of a comprehensive MRSA colonization surveillance program. It is not intended to diagnose MRSA infection nor to guide or monitor treatment for MRSA infections. Test performance is not FDA approved in patients less than 30 years old. Performed at [facility], [address], [place], [place] [address]      Medications:    busPIRone   10 mg Oral TID   carvedilol   12.5 mg Oral BID WC   Chlorhexidine  Gluconate Cloth  6 each Topical Daily   diltiazem   15 mg Intravenous Once   heparin   5,000 Units Subcutaneous Q8H   insulin  aspart  0-15 Units Subcutaneous TID WC   insulin  aspart  0-5 Units Subcutaneous QHS   insulin  aspart  4 Units Subcutaneous TID WC   insulin  glargine-yfgn  35 Units Subcutaneous BID   mouth rinse  15 mL Mouth Rinse 4 times per day   pantoprazole   40 mg Oral Daily   sodium zirconium cyclosilicate  10 g Oral Once   Continuous Infusions:  cefTRIAXone  (ROCEPHIN )  IV Stopped (11/22/23 1017)   diltiazem  (CARDIZEM ) infusion 15 mg/hr (11/23/23 0600)   vancomycin  Stopped (11/22/23 2158)      LOS: 2 days   Erle Odell Castor  [facility]  11/23/2023, 6:02 AM

## 2023-11-23 NOTE — Progress Notes (Signed)
 PHARMACY - PHYSICIAN COMMUNICATION CRITICAL VALUE ALERT - BLOOD CULTURE IDENTIFICATION (BCID)  [name] is an 58 y.o. female who presented to [facility] on 11/21/2023 with UTI and groin abscess  Assessment:  BCID + for an unidentified organism in 1 out of 4 bottles   Name of physician (or Provider) Contacted: Lynwood Kipper, NP  Current antibiotics: Vancomycin  and Ceftriaxone   Changes to prescribed antibiotics recommended:  No changes needed  Results for orders placed or performed during the hospital encounter of 11/21/23  Blood Culture ID Panel (Reflexed) (Collected: 11/21/2023  1:40 PM)  Result Value Ref Range   Enterococcus faecalis NOT DETECTED NOT DETECTED   Enterococcus Faecium NOT DETECTED NOT DETECTED   Listeria monocytogenes NOT DETECTED NOT DETECTED   Staphylococcus species NOT DETECTED NOT DETECTED   Staphylococcus aureus (BCID) NOT DETECTED NOT DETECTED   Staphylococcus epidermidis NOT DETECTED NOT DETECTED   Staphylococcus lugdunensis NOT DETECTED NOT DETECTED   Streptococcus species NOT DETECTED NOT DETECTED   Streptococcus agalactiae NOT DETECTED NOT DETECTED   Streptococcus pneumoniae NOT DETECTED NOT DETECTED   Streptococcus pyogenes NOT DETECTED NOT DETECTED   A.calcoaceticus-baumannii NOT DETECTED NOT DETECTED   Bacteroides fragilis NOT DETECTED NOT DETECTED   Enterobacterales NOT DETECTED NOT DETECTED   Enterobacter cloacae complex NOT DETECTED NOT DETECTED   Escherichia coli NOT DETECTED NOT DETECTED   Klebsiella aerogenes NOT DETECTED NOT DETECTED   Klebsiella oxytoca NOT DETECTED NOT DETECTED   Klebsiella pneumoniae NOT DETECTED NOT DETECTED   Proteus species NOT DETECTED NOT DETECTED   Salmonella species NOT DETECTED NOT DETECTED   Serratia marcescens NOT DETECTED NOT DETECTED   Haemophilus influenzae NOT DETECTED NOT DETECTED   Neisseria meningitidis NOT DETECTED NOT DETECTED   Pseudomonas aeruginosa NOT DETECTED NOT DETECTED    Stenotrophomonas maltophilia NOT DETECTED NOT DETECTED   Candida albicans NOT DETECTED NOT DETECTED   Candida auris NOT DETECTED NOT DETECTED   Candida glabrata NOT DETECTED NOT DETECTED   Candida krusei NOT DETECTED NOT DETECTED   Candida parapsilosis NOT DETECTED NOT DETECTED   Candida tropicalis NOT DETECTED NOT DETECTED   Cryptococcus neoformans/gattii NOT DETECTED NOT DETECTED    Arvin Gauss, PharmD 11/23/2023  4:33 AM

## 2023-11-23 NOTE — Discharge Instructions (Signed)

## 2023-11-23 NOTE — Progress Notes (Signed)
 PHARMACY - ANTICOAGULATION CONSULT NOTE  Pharmacy Consult for Eliquis Indication: new onset atrial fibrillation   Allergies  Allergen Reactions   [name] ] Other (See Comments)    Kidney disease   Methadose [Methadone Hcl] Other (See Comments)    Syncopal event   Corticosteroids Other (See Comments)    Hyperglycemia   Levaquin [Levofloxacin] Itching    Patient Measurements: Height: 5' 9 (175.3 cm) Weight: 98.1 kg (216 lb 4.3 oz) IBW/kg (Calculated) : 66.2 HEPARIN  DW (KG): 87.4  Vital Signs: Temp: 98.2 F (36.8 C) (10/26 0000) Temp Source: Oral (10/26 0000) BP: 129/65 (10/26 0403) Pulse Rate: 91 (10/26 0403)  Labs: Recent Labs    11/21/23 1149 11/21/23 1344 11/21/23 1402 11/21/23 1821 11/22/23 0516 11/22/23 0814 11/22/23 1817  HGB 18.4*  --  17.0*  --  16.8*  --   --   HCT 55.0*  --  50.0*  --  48.1*  --   --   PLT 371  --   --   --  346  --   --   CREATININE  --    < > 1.20*   < > 1.08* 0.99 0.92   < > = values in this interval not displayed.    Estimated Creatinine Clearance: 81.1 mL/min (by C-G formula based on SCr of 0.92 mg/dL).   Medical History: Past Medical History:  Diagnosis Date   Alcoholism in remission (HCC)    11-26-2021  pt stated no alcohol few yrs ago   Asthma    Bipolar disorder (HCC)    Chronic diastolic CHF (congestive heart failure) (HCC) 2014   followed by dr b. lonni;   Chronic hypoxemic respiratory failure [facility])    pulmonologist--- dr brenna;   (11-26-2021 per pt was on supplemental oxygen  until she 01/ 2022 did not need it,  does not check O2 sats at home anymore.   CKD (chronic kidney disease), stage III [facility])    nephrologist--- dr dolan   COPD (chronic obstructive pulmonary disease) (HCC)    11-26-2021 pt trying to quit smoking ,  started age 59   Depression    Diabetes mellitus type 2, diet-controlled (HCC)    followed by pcp   (11-26-2021  per pt last A1c by pcp on 10-30-2021 7.8,  stated does not check blood  sugar)   Dyspnea    Fibrocystic breast disease    GAD (generalized anxiety disorder)    GERD (gastroesophageal reflux disease)    Hepatitis B surface antigen positive 11/2017   pt stated asymptomatic ,  no treatment   History of cervical dysplasia 04/10/2011   '93- once dx.-got pregnant-no intervention, then postpartum, no dysplasia found   History of condyloma acuminatum 04/09/2011   Hyperlipidemia    Hypertension    Incomplete left bundle branch block (LBBB)    Lung nodule 11/2017   pulmonologist--- dr brenna;  RUL and RLL   Muscle spasm of both lower legs    chronic , due to back issues,   OA (osteoarthritis) 04/10/2011   hips, shoulders, back   OSA (obstructive sleep apnea) 2009   study on epic 09-18-2007 mild osa w/ hypoxmia ;   (11-26-2021 per pt last used cpap 01/ 2022 before bariatric surgery 04/ 2022 stated lost wt / and stopped needing supplemental oxygen  , did not need anymore)   Polyneuropathy    due to back issues and DM   Polysubstance abuse (HCC)    11-26-2021  pt hx cocaine use stated none for  yrs but ED visit 08-14-2021  positive UDS,  pt stated it was once only for depression none since   PSVT (paroxysmal supraventricular tachycardia)    followed by cardiology   Spinal stenosis, unspecified spinal region    Urge incontinence of urine    Wears dentures    upper     Assessment: Patient is a 58 y.o F who presented to the ED on 11/21/23 with AMS.  Pharmacy has been consulted on 11/23/23 to start Eliquis for new onset afib.  Today, 11/23/2023: - cbc ok on 10/25 - scr 0.92  - Pt received heparin  5000 units SQ dose ar 6:06 AM on 11/23/23   Plan:  - d/c heparin  SQ - Eliquis 5mg  bid  - Pharmacy will sign off for Eliquis. Re-consult us  if need further assistance   [name] P 11/23/2023,6:22 AM

## 2023-11-24 ENCOUNTER — Encounter (HOSPITAL_BASED_OUTPATIENT_CLINIC_OR_DEPARTMENT_OTHER): Payer: Self-pay

## 2023-11-24 ENCOUNTER — Other Ambulatory Visit (HOSPITAL_COMMUNITY): Payer: Self-pay

## 2023-11-24 ENCOUNTER — Telehealth: Payer: Self-pay | Admitting: Cardiology

## 2023-11-24 ENCOUNTER — Telehealth (HOSPITAL_COMMUNITY): Payer: Self-pay

## 2023-11-24 ENCOUNTER — Encounter (HOSPITAL_COMMUNITY): Payer: Self-pay | Admitting: Internal Medicine

## 2023-11-24 DIAGNOSIS — I4891 Unspecified atrial fibrillation: Secondary | ICD-10-CM | POA: Diagnosis not present

## 2023-11-24 DIAGNOSIS — E875 Hyperkalemia: Secondary | ICD-10-CM | POA: Diagnosis not present

## 2023-11-24 DIAGNOSIS — R4182 Altered mental status, unspecified: Secondary | ICD-10-CM | POA: Diagnosis not present

## 2023-11-24 DIAGNOSIS — E1011 Type 1 diabetes mellitus with ketoacidosis with coma: Secondary | ICD-10-CM | POA: Diagnosis not present

## 2023-11-24 LAB — GLUCOSE, CAPILLARY
Glucose-Capillary: 146 mg/dL — ABNORMAL HIGH (ref 70–99)
Glucose-Capillary: 125 mg/dL — ABNORMAL HIGH (ref 70–99)
Glucose-Capillary: 99 mg/dL (ref 70–99)

## 2023-11-24 MED ORDER — INSULIN PEN NEEDLE 31G X 6 MM MISC: 1.0000 | Freq: Two times a day (BID) | 3 refills | Status: AC

## 2023-11-24 MED ORDER — LANCETS MISC: 1.0000 | 0 refills | Status: AC

## 2023-11-24 MED ORDER — HYDROCODONE-ACETAMINOPHEN 7.5-325 MG PO TABS
1.0000 | ORAL_TABLET | Freq: Four times a day (QID) | ORAL | Status: DC | PRN
  Administered 2023-11-24: 1 via ORAL
  Filled 2023-11-24: qty 1

## 2023-11-24 MED ORDER — BLOOD GLUCOSE TEST VI STRP: 1.0000 | ORAL_STRIP | 0 refills | Status: AC

## 2023-11-24 MED ORDER — LANTUS SOLOSTAR 100 UNIT/ML ~~LOC~~ SOPN: 40.0000 [IU] | PEN_INJECTOR | Freq: Every day | SUBCUTANEOUS | 11 refills | Status: AC

## 2023-11-24 MED ORDER — BLOOD GLUCOSE MONITORING SUPPL DEVI: 1.0000 | 0 refills | Status: AC

## 2023-11-24 MED ORDER — APIXABAN 5 MG PO TABS: 5.0000 mg | ORAL_TABLET | Freq: Two times a day (BID) | ORAL | 0 refills | Status: DC

## 2023-11-24 MED ORDER — LANCET DEVICE MISC: 1.0000 | 0 refills | Status: AC

## 2023-11-24 MED ORDER — SULFAMETHOXAZOLE-TRIMETHOPRIM 800-160 MG PO TABS
1.0000 | ORAL_TABLET | Freq: Two times a day (BID) | ORAL | Status: DC
  Administered 2023-11-24: 1 via ORAL
  Filled 2023-11-24: qty 1

## 2023-11-24 MED ORDER — HUMALOG KWIKPEN 100 UNIT/ML ~~LOC~~ SOPN: 4.0000 [IU] | PEN_INJECTOR | Freq: Three times a day (TID) | SUBCUTANEOUS | 11 refills | Status: AC

## 2023-11-24 MED ORDER — ENSURE PLUS HIGH PROTEIN PO LIQD: 237.0000 mL | Freq: Two times a day (BID) | ORAL | Status: DC

## 2023-11-24 MED ORDER — SULFAMETHOXAZOLE-TRIMETHOPRIM 800-160 MG PO TABS: 1.0000 | ORAL_TABLET | Freq: Two times a day (BID) | ORAL | 0 refills | Status: AC

## 2023-11-24 MED ORDER — INSULIN GLARGINE-YFGN 100 UNIT/ML ~~LOC~~ SOLN
25.0000 [IU] | Freq: Two times a day (BID) | SUBCUTANEOUS | Status: DC
  Administered 2023-11-24: 25 [IU] via SUBCUTANEOUS
  Filled 2023-11-24 (×2): qty 0.25

## 2023-11-24 NOTE — Discharge Summary (Addendum)
 Physician Discharge Summary  VALERI SULA FMW:[id number] DOB: June 29, 1965 DOA: 11/21/2023  PCP: Elicia Hamlet, MD  Admit date: 11/21/2023 Discharge date: 11/24/2023  Admitted From: Home Disposition:  Home Recommendations for Outpatient Follow-up:  Follow up with PCP in 1-2 weeks, to make sure to follow-up with urology as an outpatient to see if we can start Eliquis as an outpatient. Please obtain BMP/CBC in one week With urology as an outpatient to dictate when to restart Eliquis.   Home Health:Yes Equipment/Devices:None Discharge Condition:Stable CODE STATUS:Full Diet recommendation: Heart Healthy  Brief/Interim Summary: 58 y.o. female past medical history significant for bipolar disorder, HFpEF, chronic kidney disease stage III, diabetes mellitus type 2 admitted with severe sepsis due to UTI complicated by hyperkalemia, DKA and A-fib with RVR.   Discharge Diagnoses:  Principal Problem:   DKA (diabetic ketoacidosis) (HCC) Active Problems:   Chronic diastolic CHF (congestive heart failure) (HCC)   Type 2 diabetes mellitus with stage 3 chronic kidney disease, with long-term current use of insulin  (HCC)   Chronic kidney disease (CKD), stage IV (severe) (HCC)   Essential hypertension   Pressure injury of skin   Hyperkalemia   Severe sepsis (HCC)   New onset a-fib (HCC)   AKI (acute kidney injury)  Severe sepsis likely due to Klebsiella UTI: She was started on IV fluids and IV empiric antibiotics. Blood cultures grew Corynebacterium which is probably a contaminant. Urine culture grew Klebsiella UTI she was changed to oral Bactrim  which she continue as an outpatient. Physical therapy evaluated the patient recommended skilled nursing facility which the patient and her son adamantly refused. She will go home with physical therapy.  DKA/diabetes mellitus type 2 with chronic kidney disease stage III with long-term insulin  use: Her last A1c was 5.9 2024, she told me she was taken  off insulin  about a year ago. She was start on IV insulin  IV fluids her gap closed bicarb came greater than 20. She will go on long-acting insulin  plus short acting insulin  with meal coverage. Continue to Check CBGs AC and at bedtime.  New onset A-fib with RVR: She was placed on IV diltiazem  her oral Coreg  was restarted her rate remained controlled. Twelve-lead EKG showed an EF of 70% no regional wall motion abnormality. She was transition to oral diltiazem  and Coreg  and her rate remained controlled. Her anticoagulation was held she started to have hematuria, her hemoglobin remained stable.  It was discussed with the patient. She related that she did not want to stay in the hospital anymore time then needed, she will follow-up with her urologist as an outpatient. We held her Eliquis, she will follow-up with urology as an outpatient. PCP to start Eliquis as an outpatient.  Hyperkalemia: She was started on IV calcium  gluconate IV insulin  and dextrose  she was given oral Lokelma and IV fluids. Her hyperkalemia resolved.  Hematuria: This started after Foley placement.  Her hemoglobin remained stable throughout her hospital stay. Her Eliquis was held, she will go home off Eliquis will follow-up with urology as an outpatient. And urology to dictate when to restart anticoagulation as an outpatient.  Acute metabolic encephalopathy: Multifactorial likely due to infectious etiology and possibly DKA. CT of the head showed no acute findings. Patient related her mentation returned to baseline.  Acute kidney injury on chronic kidney see stage IIIb chronic with a baseline creatinine 0.6-1: On admission 1.3 she was started on IV fluids ACE inhibitor and Lasix  were held along with Aldactone . Her creatinine returned to baseline. She will  resume her Lasix  and her ARB at lower dose and outpatient.  Essential hypertension: On admission antihypertensive medications were held. They were resumed slowly she  will go home on Coreg  Lasix  and diltiazem . Her blood pressure was around 140 so her Coreg  dose was DC'd to 25 mg daily which should continue as an outpatient. Follow-up with PCP as an outpatient and titrate antihypertensive medications as tolerated.  Small boil in the groin area: She was started on IV vancomycin  transition to oral Bactrim  which will continue as an outpatient.  Sacral decubitus ulcer stage II present on admission: Wound care was consulted.  Hyperkalemia Treated with IV fluids and Lokelma  Discharge Instructions  Discharge Instructions     Diet - low sodium heart healthy   Complete by: As directed    Discharge wound care:   Complete by: As directed    Per wound care instructions   Increase activity slowly   Complete by: As directed       Allergies as of 11/24/2023       Reactions   Dorethia Canterbury ] Other (See Comments)   Kidney disease   Methadose [methadone Hcl] Other (See Comments)   Syncopal event   Corticosteroids Other (See Comments)   Hyperglycemia   Levaquin [levofloxacin] Itching        Medication List     STOP taking these medications    albuterol  108 (90 Base) MCG/ACT inhaler Commonly known as: VENTOLIN  HFA   diclofenac  Sodium 1 % Gel Commonly known as: VOLTAREN    Narcan  4 MG/0.1ML Liqd nasal spray kit Generic drug: naloxone        TAKE these medications    acetaminophen  500 MG tablet Commonly known as: TYLENOL  Take 1,000 mg by mouth 2 (two) times daily.   ammonium lactate  12 % lotion Commonly known as: LAC-HYDRIN  For dry skin What changed:  how much to take how to take this when to take this reasons to take this additional instructions   apixaban 5 MG Tabs tablet Commonly known as: ELIQUIS Take 1 tablet (5 mg total) by mouth 2 (two) times daily.   atorvastatin  40 MG tablet Commonly known as: LIPITOR Take 1 tablet (40 mg total) by mouth daily.   baclofen  10 MG tablet Commonly known as: LIORESAL  TAKE ONE TABLET  (10mg  total) BY MOUTH THREE TIMES DAILY AS NEEDED muscle SPASMS What changed: See the new instructions.   BARIATRIC MULTIVITAMIN/IRON  PO Take 1 tablet by mouth daily.   Blood Glucose Monitoring Suppl Devi 1 each by Does not apply route as directed. Dispense based on patient and insurance preference. Use up to four times daily as directed. (FOR ICD-10 E10.9, E11.9).   BLOOD GLUCOSE TEST STRIPS Strp 1 each by Does not apply route as directed. Dispense based on patient and insurance preference. Use up to four times daily as directed. (FOR ICD-10 E10.9, E11.9).   busPIRone  10 MG tablet Commonly known as: BUSPAR  Take 1 tablet (10 mg total) by mouth 3 (three) times daily.   carvedilol  12.5 MG tablet Commonly known as: COREG  TAKE ONE TABLET (12.5MG  TOTAL) BY MOUTH TWICE DAILY   cloNIDine  0.2 MG tablet Commonly known as: CATAPRES  Take 1 tablet (0.2 mg total) by mouth 2 (two) times daily.   Dilt-XR 120 MG 24 hr capsule Generic drug: diltiazem  TAKE ONE CAPSULE BY MOUTH TWICE DAILY   empagliflozin  25 MG Tabs tablet Commonly known as: Jardiance  Take 1 tablet (25 mg total) by mouth daily before breakfast.   fluticasone  50 MCG/ACT nasal spray  Commonly known as: FLONASE  instill TWO SPRAYS IN EACH NOSTRIL TWICE DAILY   furosemide  80 MG tablet Commonly known as: LASIX  TAKE ONE TABLET BY MOUTH TWICE DAILY relaTED TO SYSTEMIC (congestive heart failure)   gabapentin  300 MG capsule Commonly known as: NEURONTIN  Take 1 capsule (300 mg total) by mouth 3 (three) times daily.   HumaLOG  KwikPen 100 UNIT/ML KwikPen Generic drug: insulin  lispro Inject 4 Units into the skin 3 (three) times daily.   Insulin  Pen Needle 31G X 6 MM Misc 1 Device by Does not apply route 2 (two) times daily.   ketoconazole  2 % cream Commonly known as: NIZORAL  APPLY TO THE AFFECTED AREA(S) EVERY DAY   Lancet Device Misc 1 each by Does not apply route as directed. Dispense based on patient and insurance preference.  Use up to four times daily as directed. (FOR ICD-10 E10.9, E11.9).   Lancets Misc 1 each by Does not apply route as directed. Dispense based on patient and insurance preference. Use up to four times daily as directed. (FOR ICD-10 E10.9, E11.9).   Lantus  SoloStar 100 UNIT/ML Solostar Pen Generic drug: insulin  glargine Inject 40 Units into the skin daily.   loratadine  10 MG tablet Commonly known as: CLARITIN  TAKE ONE TABLET BY MOUTH EVERY DAY FOR allergies   losartan  25 MG tablet Commonly known as: COZAAR  Take 25 mg by mouth daily.   montelukast  10 MG tablet Commonly known as: SINGULAIR  TAKE ONE TABLET BY MOUTH AT BEDTIME   mupirocin  ointment 2 % Commonly known as: BACTROBAN  Apply 1 Application topically 2 (two) times daily. apply to elbow sore, until healed.   omeprazole  20 MG capsule Commonly known as: PRILOSEC Take 1 capsule (20 mg total) by mouth daily.   potassium chloride  SA 20 MEQ tablet Commonly known as: KLOR-CON  M TAKE ONE TABLET ( TOTAL) BY MOUTH EVERY DAY   sodium chloride  0.65 % Soln nasal spray Commonly known as: OCEAN Place 1 spray into both nostrils as needed for congestion.   spironolactone  25 MG tablet Commonly known as: ALDACTONE  Take 0.5 tablets (12.5 mg total) by mouth daily.   sulfamethoxazole -trimethoprim  800-160 MG tablet Commonly known as: BACTRIM  DS Take 1 tablet by mouth every 12 (twelve) hours for 4 days.   Trelegy Ellipta  100-62.5-25 MCG/ACT Aepb Generic drug: Fluticasone -Umeclidin-Vilant INHALE ONE PUFF INTO THE LUNGS EVERY DAY RELATED TO ACUTE AND CHRONIC RESPIRATOPR FAILURE   venlafaxine  XR 75 MG 24 hr capsule Commonly known as: EFFEXOR -XR Take 1 capsule (75 mg total) by mouth daily with breakfast.               Discharge Care Instructions  (From admission, onward)           Start     Ordered   11/24/23 0000  Discharge wound care:       Comments: Per wound care instructions   11/24/23 1019             Allergies  Allergen Reactions   Dorethia [Aspirin ] Other (See Comments)    Kidney disease   Methadose [Methadone Hcl] Other (See Comments)    Syncopal event   Corticosteroids Other (See Comments)    Hyperglycemia   Levaquin [Levofloxacin] Itching    Consultations: None   Procedures/Studies: ECHOCARDIOGRAM COMPLETE Result Date: 11/22/2023    ECHOCARDIOGRAM REPORT   Patient Name:   TYLEE YUM Date of Exam: 11/22/2023 Medical Rec #:  [id number]       Height:       69.0 in Accession #:  [id number]      Weight:       216.3 lb Date of Birth:  [birth year]       BSA:          2.136 m Patient Age:    58 years        BP:           122/90 mmHg Patient Gender: F               HR:           91 bpm. Exam Location:  Inpatient Procedure: 2D Echo, Color Doppler, Cardiac Doppler and Intracardiac            Opacification Agent (Both Spectral and Color Flow Doppler were            utilized during procedure). Indications:    I48.91* Unspecified atrial fibrillation  History:        Patient has prior history of Echocardiogram examinations, most                 recent 06/10/2022. CHF, COPD, Arrythmias:Atrial Fibrillation;                 Risk Factors:Diabetes, Hypertension, Dyslipidemia and Sleep                 Apnea.  Sonographer:    Damien Senior RDCS Referring Phys: [id number] [facility]  Sonographer Comments: Technically difficult study due to poor echo windows. Technically difficult study due to very poor echo windows. IMPRESSIONS  1. There is mid-cavity acceleration up to , patient unable to augment with Valsalva. Suspect this is related to hyperdynamic LV function. Left ventricular ejection fraction, by estimation, is 70 to 75%. The left ventricle has hyperdynamic function. The left ventricle has no regional wall motion abnormalities. There is mild concentric left ventricular hypertrophy. Left ventricular diastolic parameters are indeterminate.  2. Right ventricular systolic function is normal. The  right ventricular size is mildly enlarged. Tricuspid regurgitation signal is inadequate for assessing PA pressure.  3. Left atrial size was moderately dilated.  4. Right atrial size was moderately dilated.  5. The mitral valve is grossly normal. No evidence of mitral valve regurgitation.  6. The aortic valve was not well visualized. Aortic valve regurgitation is not visualized.  7. The inferior vena cava is normal in size with greater than 50% respiratory variability, suggesting right atrial pressure of 3 mmHg. FINDINGS  Left Ventricle: There is mid-cavity acceleration up to , patient unable to augment with Valsalva. Suspect this is related to hyperdynamic LV function. Left ventricular ejection fraction, by estimation, is 70 to 75%. The left ventricle has hyperdynamic function. The left ventricle has no regional wall motion abnormalities. Definity  contrast agent was given IV to delineate the left ventricular endocardial borders. The left ventricular internal cavity size was normal in size. There is mild concentric left ventricular hypertrophy. Left ventricular diastolic function could not be evaluated due to atrial fibrillation. Left ventricular diastolic parameters are indeterminate. Right Ventricle: The right ventricular size is mildly enlarged. No increase in right ventricular wall thickness. Right ventricular systolic function is normal. Tricuspid regurgitation signal is inadequate for assessing PA pressure. Left Atrium: Left atrial size was moderately dilated. Right Atrium: Right atrial size was moderately dilated. Pericardium: There is no evidence of pericardial effusion. Presence of epicardial fat layer. Mitral Valve: The mitral valve is grossly normal. No evidence of mitral valve regurgitation. Tricuspid Valve: The tricuspid valve is grossly normal. Tricuspid valve regurgitation  is trivial. No evidence of tricuspid stenosis. Aortic Valve: The aortic valve was not well visualized. Aortic valve  regurgitation is not visualized. Pulmonic Valve: The pulmonic valve was not well visualized. Pulmonic valve regurgitation is not visualized. Aorta: The aortic root is normal in size and structure and the ascending aorta was not well visualized. Venous: The inferior vena cava is normal in size with greater than 50% respiratory variability, suggesting right atrial pressure of 3 mmHg. IAS/Shunts: No atrial level shunt detected by color flow Doppler.  LEFT VENTRICLE PLAX 2D LVIDd:         3.90 cm LVIDs:         2.50 cm LV PW:         1.10 cm LV IVS:        1.10 cm LVOT diam:     1.90 cm LV SV:         43 LV SV Index:   20 LVOT Area:     2.84 cm  LEFT ATRIUM           Index        RIGHT ATRIUM           Index LA diam:      2.80 cm 1.31 cm/m   RA Area:     25.90 cm LA Vol (A4C): 87.1 ml 40.78 ml/m  RA Volume:   84.60 ml  39.61 ml/m  AORTIC VALVE LVOT Vmax:   83.60 cm/s LVOT Vmean:  63.600 cm/s LVOT VTI:    0.151 m  AORTA Ao Root diam: 3.20 cm  SHUNTS Systemic VTI:  0.15 m Systemic Diam: 1.90 cm Darryle Decent MD Electronically signed by Darryle Decent MD Signature Date/Time: 11/22/2023/12:51:25 PM    Final    DG CHEST PORT 1 VIEW Result Date: 11/21/2023 CLINICAL DATA:  Shortness of breath EXAM: PORTABLE CHEST 1 VIEW COMPARISON:  11/21/2023, 10/30/2023, 09/09/2022 FINDINGS: Hardware in the cervical spine. Hypoventilatory change. Cardiomegaly with vascular congestion and diffuse interstitial and ground-glass opacity. No pleural effusion. IMPRESSION: Cardiomegaly with vascular congestion and diffuse interstitial and ground-glass opacity, possible edema Electronically Signed   By: Luke Bun M.D.   On: 11/21/2023 22:05   DG Chest Port 1 View Result Date: 11/21/2023 EXAM: 1 VIEW(S) XRAY OF THE CHEST 11/21/2023 11:28:00 AM COMPARISON: 10/30/2023 CLINICAL HISTORY: sob. Son called EMS due to pt being altered, twitching, and confused. Weakness beginning Wednesday, that has worsened. PT reported LEFT ear pain  beginning Wednesday, and RIGHT ear pain beginning Thursday. Pt states that she has been unable to eat ; Hx of CHF, asthma, COPD, HTN, DM, PSVT, smoker ; Best obtainable image due to pt mental status, was held by a technologist for imaging FINDINGS: LUNGS AND PLEURA: No focal pulmonary opacity. No pulmonary edema. No pleural effusion. No pneumothorax. HEART AND MEDIASTINUM: Similar cardiomegaly. No acute abnormality of the mediastinal silhouette. BONES AND SOFT TISSUES: Intact cervical spinal fixation hardware. No acute osseous abnormality. IMPRESSION: 1. No acute cardiopulmonary process. Electronically signed by: Norleen Boxer MD 11/21/2023 12:12 PM EDT RP Workstation: [id number]   CT Head Wo Contrast Result Date: 11/21/2023 EXAM: CT HEAD WITHOUT CONTRAST 11/21/2023 11:42:19 AM TECHNIQUE: CT of the head was performed without the administration of intravenous contrast. Automated exposure control, iterative reconstruction, and/or weight based adjustment of the mA/kV was utilized to reduce the radiation dose to as low as reasonably achievable. COMPARISON: CT head 09/08/2022. CLINICAL HISTORY: Mental status change, unknown cause. Table formatting from the original note was not included.;  Pt arrives via EMS from home. Son called EMS due to pt being altered, twitching, and confused. EMS notes tachypnea and tachycardia. Pt alert, but drifts off to sleep between questions. ; ; 10:40 AM; Upon arrival of son at bedside: Weakness beginning Wednesday, that has worsened. PT reported LEFT ear pain beginning Wednesday, and RIGHT ear pain beginning Thursday. Pt states that she has been unable to eat. FINDINGS: BRAIN AND VENTRICLES: No acute hemorrhage. No evidence of acute infarct. No hydrocephalus. No extra-axial collection. No mass effect or midline shift. Dural calcifications. Nonspecific hypoattenuation in the periventricular and subcortical white matter, most likely representing chronic microvascular ischemic changes.  Similar appearance of the vasculature of the skull base. ORBITS: Bilateral lens replacement. SINUSES: No acute abnormality. SOFT TISSUES AND SKULL: No acute soft tissue abnormality. No skull fracture. IMPRESSION: 1. No acute intracranial abnormality. 2. Chronic microvascular ischemic changes. Electronically signed by: Donnice Mania MD 11/21/2023 11:51 AM EDT RP Workstation: [id number]   XR Lumbar Spine 1 View Result Date: 11/06/2023 X-rays of the lumbar spine in the supine position from 11/06/2023 were independently reviewed and interpreted, showing improved alignment when compared to her standing films.  Kyphosis above the fusion is decreased in this position.  XR Shoulder Right Result Date: 11/04/2023 3 view xrays show no acute abnormalities.  Degenerative changes throughout the shoulder.    XR KNEE 3 VIEW RIGHT Result Date: 11/04/2023 X-rays demonstrate severe tricompartmental osteoarthritis.  Bone-on-bone joint space narrowing.  Kellgren-Lawrence stage IV  DG Chest Port 1 View Result Date: 10/30/2023 EXAM: 1 VIEW(S) XRAY OF THE CHEST 10/30/2023 12:18:00 PM COMPARISON: 09/09/2022 CLINICAL HISTORY: S/P bronchoscopy. FINDINGS: LUNGS AND PLEURA: Mild vascular indistinctness, cannot exclude pulmonary venous Hypertension. No overt edema. No focal pulmonary opacity. No pleural effusion. No pneumothorax. HEART AND MEDIASTINUM: Heart size within normal limits. Rightward rotation mildly reduces diagnostic sensitivity and specificity. BONES AND SOFT TISSUES: Thoracic spondylosis. Cervical fusion hardware noted. No acute osseous abnormality. IMPRESSION: 1. Mild vascular indistinctness suggestive of possible pulmonary venous hypertension. No overt edema. 2. Thoracic spondylosis. 3. Cervical fusion hardware. Electronically signed by: Ryan Salvage MD 10/30/2023 12:57 PM EDT RP Workstation: [id number]   DG C-ARM BRONCHOSCOPY Result Date: 10/30/2023 C-ARM BRONCHOSCOPY: Fluoroscopy was utilized by the requesting  physician.  No radiographic interpretation.   DG C-Arm 1-60 Min-No Report Result Date: 10/30/2023 Fluoroscopy was utilized by the requesting physician.  No radiographic interpretation.   CT Super D Chest Wo Contrast Result Date: 10/30/2023 CLINICAL DATA:  Follow-up right lung nodule.  Preop evaluation. EXAM: CT CHEST WITHOUT CONTRAST TECHNIQUE: Multidetector CT imaging of the chest was performed using thin slice collimation for electromagnetic bronchoscopy planning purposes, without intravenous contrast. RADIATION DOSE REDUCTION: This exam was performed according to the departmental dose-optimization program which includes automated exposure control, adjustment of the mA and/or kV according to patient size and/or use of iterative reconstruction technique. COMPARISON:  09/24/2023 FINDINGS: Cardiovascular:  No acute findings. Mediastinum/Nodes: No masses or pathologically enlarged lymph nodes identified on this unenhanced exam. Mild goiter again noted. Lungs/Pleura: 1.7 x 1.3 cm spiculated nodule in the lateral right upper lobe on image 50/4 shows no significant change, highly suspicious for bronchogenic carcinoma. No other suspicious pulmonary nodules or masses identified. No evidence of acute infiltrate or pleural effusion. Upper Abdomen: Small hiatal hernia and prior sleeve gastrectomy again noted. Stable diffuse thickening of adrenal glands, without discrete adrenal mass. Musculoskeletal: No suspicious bone lesions. Fusion hardware again seen in the upper and lower thoracic spine. IMPRESSION: Stable 1.7 cm spiculated  nodule in lateral right upper lobe, highly suspicious for invasive adenocarcinoma. No evidence of thoracic metastatic disease. Small hiatal hernia and prior sleeve gastrectomy. Electronically Signed   By: Norleen DELENA Kil M.D.   On: 10/30/2023 09:42   DG BONE DENSITY (DXA) Result Date: 10/28/2023 EXAM: DUAL X-RAY ABSORPTIOMETRY (DXA) FOR BONE MINERAL DENSITY 10/28/2023 8:58 am CLINICAL DATA:  58  year old Female Postmenopausal. Osteoporosis screening TECHNIQUE: An axial (e.g., hips, spine) and/or appendicular (e.g., radius) exam was performed, as appropriate, using GE Secretary/administrator at [facility]. Images are obtained for bone mineral density measurement and are not obtained for diagnostic purposes. [id number] Exclusions: Lumbar spine. COMPARISON:  None. FINDINGS: Scan quality: Good. LEFT FEMORAL NECK: BMD (in g/cm2): 0.844 T-score: -1.4 Z-score: -0.2 LEFT TOTAL HIP: BMD (in g/cm2): 0.707 T-score: -2.4 Z-score: -1.5 RIGHT FEMORAL NECK: BMD (in g/cm2): 0.944 T-score: -0.7 Z-score: 0.6 RIGHT TOTAL HIP: BMD (in g/cm2): 0.824 T-score: -1.5 Z-score: -0.5 LEFT FOREARM (RADIUS 33%): BMD (in g/cm2): 0.916 T-score: 0.5 Z-score: 1.4 FRAX 10-YEAR PROBABILITY OF FRACTURE: 10-year fracture risk is performed using the [facility] FRAX calculator based on patient-reported risk factors. Major osteoporotic fracture: 7.2% Hip fracture: 0.9% Other situations known to alter the reliability of the FRAX score should be considered when making treatment decisions, including chronic glucocorticoid use and past treatments. Further guidance on treatment can be found at the [facility] Osteoporosis Foundation's website [email or web address] IMPRESSION: Osteopenia based on BMD. Fracture risk is increased. Increased risk is based on low BMD. RECOMMENDATIONS: 1. All patients should optimize calcium  and vitamin D  intake. 2. Consider FDA-approved medical therapies in postmenopausal women and men aged 22 years and older, based on the following: - A hip or vertebral (clinical or morphometric) fracture - T-score less than or equal to -2.5 and secondary causes have been excluded. - Low bone mass (T-score between -1.0 and -2.5) and a 10-year probability of a hip fracture greater than or equal to 3% or a 10-year probability of a major osteoporosis-related fracture greater than or equal to 20% based on the  [place] -adapted WHO algorithm. - Clinician judgment and/or patient preferences may indicate treatment for people with 10-year fracture probabilities above or below these levels 3. Patients with diagnosis of osteoporosis or at high risk for fracture should have regular bone mineral density tests. For patients eligible for Medicare, routine testing is allowed once every 2 years. The testing frequency can be increased to one year for patients who have rapidly progressing disease, those who are receiving or discontinuing medical therapy to restore bone mass, or have additional risk factors. Electronically Signed   By: Reyes Phi M.D.   On: 10/28/2023 09:42   (Echo, Carotid, EGD, Colonoscopy, ERCP)    Subjective: No complaints  Discharge Exam: Vitals:   11/24/23 0400 11/24/23 0727  BP: (!) 156/93 134/84  Pulse: 95 98  Resp: 20 18  Temp: 97.8 F (36.6 C) 97.7 F (36.5 C)  SpO2: 92% 94%   Vitals:   11/24/23 0005 11/24/23 0200 11/24/23 0400 11/24/23 0727  BP:  (!) 135/90 (!) 156/93 134/84  Pulse: 91 93 95 98  Resp: (!) 23 (!) 24 20 18   Temp:   97.8 F (36.6 C) 97.7 F (36.5 C)  TempSrc:   Oral   SpO2: 96% 93% 92% 94%  Weight:      Height:        General: Pt is alert, awake, not in acute distress Cardiovascular: RRR, S1/S2 +, no rubs, no  gallops Respiratory: CTA bilaterally, no wheezing, no rhonchi Abdominal: Soft, NT, ND, bowel sounds + Extremities: no edema, no cyanosis    The results of significant diagnostics from this hospitalization (including imaging, microbiology, ancillary and laboratory) are listed below for reference.     Microbiology: Recent Results (from the past 240 hours)  Urine Culture     Status: Abnormal   Collection Time: 11/21/23 11:49 AM   Specimen: Urine, Clean Catch  Result Value Ref Range Status   Specimen Description   Final    URINE, CLEAN CATCH Performed at [facility], [address], [place], [place] [address]    Special  Requests   Final    NONE Performed at [facility], [address], [place], [place] [address]    Culture >=100,000 COLONIES/mL KLEBSIELLA PNEUMONIAE (A)  Final   Report Status 11/23/2023 FINAL  Final   Organism ID, Bacteria KLEBSIELLA PNEUMONIAE (A)  Final      Susceptibility   Klebsiella pneumoniae - MIC*    AMPICILLIN >=32 RESISTANT Resistant     CEFAZOLIN  (URINE) Value in next row Sensitive      <=1 SENSITIVEThis is a modified FDA-approved test that has been validated and its performance characteristics determined by the reporting laboratory.  This laboratory is certified under the Clinical Laboratory Improvement Amendments CLIA as qualified to perform high complexity clinical laboratory testing.    CEFEPIME Value in next row Sensitive      <=1 SENSITIVEThis is a modified FDA-approved test that has been validated and its performance characteristics determined by the reporting laboratory.  This laboratory is certified under the Clinical Laboratory Improvement Amendments CLIA as qualified to perform high complexity clinical laboratory testing.    ERTAPENEM Value in next row Sensitive      <=1 SENSITIVEThis is a modified FDA-approved test that has been validated and its performance characteristics determined by the reporting laboratory.  This laboratory is certified under the Clinical Laboratory Improvement Amendments CLIA as qualified to perform high complexity clinical laboratory testing.    CEFTRIAXONE  Value in next row Sensitive      <=1 SENSITIVEThis is a modified FDA-approved test that has been validated and its performance characteristics determined by the reporting laboratory.  This laboratory is certified under the Clinical Laboratory Improvement Amendments CLIA as qualified to perform high complexity clinical laboratory testing.    CIPROFLOXACIN Value in next row Sensitive      <=1 SENSITIVEThis is a modified FDA-approved test that has been validated and its  performance characteristics determined by the reporting laboratory.  This laboratory is certified under the Clinical Laboratory Improvement Amendments CLIA as qualified to perform high complexity clinical laboratory testing.    GENTAMICIN Value in next row Sensitive      <=1 SENSITIVEThis is a modified FDA-approved test that has been validated and its performance characteristics determined by the reporting laboratory.  This laboratory is certified under the Clinical Laboratory Improvement Amendments CLIA as qualified to perform high complexity clinical laboratory testing.    NITROFURANTOIN Value in next row Sensitive      <=1 SENSITIVEThis is a modified FDA-approved test that has been validated and its performance characteristics determined by the reporting laboratory.  This laboratory is certified under the Clinical Laboratory Improvement Amendments CLIA as qualified to perform high complexity clinical laboratory testing.    TRIMETH /SULFA  Value in next row Sensitive      <=1 SENSITIVEThis is a modified FDA-approved test that has been validated and its performance characteristics determined  by the reporting laboratory.  This laboratory is certified under the Clinical Laboratory Improvement Amendments CLIA as qualified to perform high complexity clinical laboratory testing.    AMPICILLIN/SULBACTAM Value in next row Sensitive      <=1 SENSITIVEThis is a modified FDA-approved test that has been validated and its performance characteristics determined by the reporting laboratory.  This laboratory is certified under the Clinical Laboratory Improvement Amendments CLIA as qualified to perform high complexity clinical laboratory testing.    PIP/TAZO Value in next row Sensitive      <=4 SENSITIVEThis is a modified FDA-approved test that has been validated and its performance characteristics determined by the reporting laboratory.  This laboratory is certified under the Clinical Laboratory Improvement Amendments  CLIA as qualified to perform high complexity clinical laboratory testing.    MEROPENEM Value in next row Sensitive      <=4 SENSITIVEThis is a modified FDA-approved test that has been validated and its performance characteristics determined by the reporting laboratory.  This laboratory is certified under the Clinical Laboratory Improvement Amendments CLIA as qualified to perform high complexity clinical laboratory testing.    * >=100,000 COLONIES/mL KLEBSIELLA PNEUMONIAE  Blood culture (routine x 2)     Status: Abnormal   Collection Time: 11/21/23  1:40 PM   Specimen: BLOOD  Result Value Ref Range Status   Specimen Description   Final    BLOOD BLOOD LEFT ARM Performed at [facility], [address], [place], [place] [address]    Special Requests   Final    BOTTLES DRAWN AEROBIC AND ANAEROBIC Blood Culture results may not be optimal due to an inadequate volume of blood received in culture bottles Performed at [facility], [address], [place], [place] [address]    Culture  Setup Time (A)  Final    GRAM VARIABLE ROD UNIDENTIFIED ORGANISM AEROBIC BOTTLE ONLY  L POINTDEXTER PHARM D 11/23/2023 @0408  BY DD    Culture (A)  Final    CORYNEBACTERIUM AMYCOLATUM Standardized susceptibility testing for this organism is not available. Performed at [facility], [address] [place], [place] [address]    Report Status 11/24/2023 FINAL  Final  Blood Culture ID Panel (Reflexed)     Status: None   Collection Time: 11/21/23  1:40 PM  Result Value Ref Range Status   Enterococcus faecalis NOT DETECTED NOT DETECTED Final   Enterococcus Faecium NOT DETECTED NOT DETECTED Final   Listeria monocytogenes NOT DETECTED NOT DETECTED Final   Staphylococcus species NOT DETECTED NOT DETECTED Final   Staphylococcus aureus (BCID) NOT DETECTED NOT DETECTED Final   Staphylococcus epidermidis NOT DETECTED NOT DETECTED Final   Staphylococcus lugdunensis NOT DETECTED  NOT DETECTED Final   Streptococcus species NOT DETECTED NOT DETECTED Final   Streptococcus agalactiae NOT DETECTED NOT DETECTED Final   Streptococcus pneumoniae NOT DETECTED NOT DETECTED Final   Streptococcus pyogenes NOT DETECTED NOT DETECTED Final   A.calcoaceticus-baumannii NOT DETECTED NOT DETECTED Final   Bacteroides fragilis NOT DETECTED NOT DETECTED Final   Enterobacterales NOT DETECTED NOT DETECTED Final   Enterobacter cloacae complex NOT DETECTED NOT DETECTED Final   Escherichia coli NOT DETECTED NOT DETECTED Final   Klebsiella aerogenes NOT DETECTED NOT DETECTED Final   Klebsiella oxytoca NOT DETECTED NOT DETECTED Final   Klebsiella pneumoniae NOT DETECTED NOT DETECTED Final   Proteus species NOT DETECTED NOT DETECTED Final   Salmonella species NOT DETECTED NOT DETECTED Final   Serratia marcescens NOT DETECTED NOT DETECTED  Final   Haemophilus influenzae NOT DETECTED NOT DETECTED Final   Neisseria meningitidis NOT DETECTED NOT DETECTED Final   Pseudomonas aeruginosa NOT DETECTED NOT DETECTED Final   Stenotrophomonas maltophilia NOT DETECTED NOT DETECTED Final   Candida albicans NOT DETECTED NOT DETECTED Final   Candida auris NOT DETECTED NOT DETECTED Final   Candida glabrata NOT DETECTED NOT DETECTED Final   Candida krusei NOT DETECTED NOT DETECTED Final   Candida parapsilosis NOT DETECTED NOT DETECTED Final   Candida tropicalis NOT DETECTED NOT DETECTED Final   Cryptococcus neoformans/gattii NOT DETECTED NOT DETECTED Final    Comment: Performed at [facility], [address] [place], [place] [address]  Blood culture (routine x 2)     Status: None (Preliminary result)   Collection Time: 11/21/23  1:44 PM   Specimen: BLOOD  Result Value Ref Range Status   Specimen Description   Final    BLOOD BLOOD RIGHT ARM Performed at [facility], [address] [place], [place] [address]    Special Requests   Final    BOTTLES DRAWN AEROBIC AND ANAEROBIC  Blood Culture adequate volume Performed at [facility], [address], [place], [place] [address]    Culture   Final    NO GROWTH 3 DAYS Performed at [facility], [address], [place], [place] [address]    Report Status PENDING  Incomplete  Group A Strep by PCR     Status: None   Collection Time: 11/21/23  1:44 PM   Specimen: Peripheral; Sterile Swab  Result Value Ref Range Status   Group A Strep by PCR NOT DETECTED NOT DETECTED Final    Comment: Performed at [facility], [address] [place], [place] [address]  Resp panel by RT-PCR (RSV, Flu A&B, Covid) Anterior Nasal Swab     Status: None   Collection Time: 11/21/23  3:33 PM   Specimen: Anterior Nasal Swab  Result Value Ref Range Status   SARS Coronavirus 2 by RT PCR NEGATIVE NEGATIVE Final    Comment: (NOTE) SARS-CoV-2 target nucleic acids are NOT DETECTED.  The SARS-CoV-2 RNA is generally detectable in upper respiratory specimens during the acute phase of infection. The lowest concentration of SARS-CoV-2 viral copies this assay can detect is 138 copies/mL. A negative result does not preclude SARS-Cov-2 infection and should not be used as the sole basis for treatment or other patient management decisions. A negative result may occur with  improper specimen collection/handling, submission of specimen other than nasopharyngeal swab, presence of viral mutation(s) within the areas targeted by this assay, and inadequate number of viral copies(<138 copies/mL). A negative result must be combined with clinical observations, patient history, and epidemiological information. The expected result is Negative.  Fact Sheet for Patients:  bloggercourse.com  Fact Sheet for Healthcare Providers:  seriousbroker.it  This test is no t yet approved or cleared by the [place]  FDA and  has been authorized for detection and/or diagnosis of  SARS-CoV-2 by FDA under an Emergency Use Authorization (EUA). This EUA will remain  in effect (meaning this test can be used) for the duration of the COVID-19 declaration under Section 564(b)(1) of the Act, 21 U.S.C.section 360bbb-3(b)(1), unless the authorization is terminated  or revoked sooner.       Influenza A by PCR NEGATIVE NEGATIVE Final   Influenza B by PCR NEGATIVE NEGATIVE Final    Comment: (NOTE) The Xpert Xpress SARS-CoV-2/FLU/RSV plus assay is intended as  an aid in the diagnosis of influenza from Nasopharyngeal swab specimens and should not be used as a sole basis for treatment. Nasal washings and aspirates are unacceptable for Xpert Xpress SARS-CoV-2/FLU/RSV testing.  Fact Sheet for Patients: bloggercourse.com  Fact Sheet for Healthcare Providers: seriousbroker.it  This test is not yet approved or cleared by the [place]  FDA and has been authorized for detection and/or diagnosis of SARS-CoV-2 by FDA under an Emergency Use Authorization (EUA). This EUA will remain in effect (meaning this test can be used) for the duration of the COVID-19 declaration under Section 564(b)(1) of the Act, 21 U.S.C. section 360bbb-3(b)(1), unless the authorization is terminated or revoked.     Resp Syncytial Virus by PCR NEGATIVE NEGATIVE Final    Comment: (NOTE) Fact Sheet for Patients: bloggercourse.com  Fact Sheet for Healthcare Providers: seriousbroker.it  This test is not yet approved or cleared by the [place]  FDA and has been authorized for detection and/or diagnosis of SARS-CoV-2 by FDA under an Emergency Use Authorization (EUA). This EUA will remain in effect (meaning this test can be used) for the duration of the COVID-19 declaration under Section 564(b)(1) of the Act, 21 U.S.C. section 360bbb-3(b)(1), unless the authorization is terminated  or revoked.  Performed at [facility], [address] [place], [place] [address]   MRSA Next Gen by PCR, Nasal     Status: None   Collection Time: 11/21/23  6:26 PM   Specimen: Nasal Mucosa; Nasal Swab  Result Value Ref Range Status   MRSA by PCR Next Gen NOT DETECTED NOT DETECTED Final    Comment: (NOTE) The GeneXpert MRSA Assay (FDA approved for NASAL specimens only), is one component of a comprehensive MRSA colonization surveillance program. It is not intended to diagnose MRSA infection nor to guide or monitor treatment for MRSA infections. Test performance is not FDA approved in patients less than 7 years old. Performed at [facility], [address], [place], [place] [address]      Labs: BNP (last 3 results) No results for input(s): BNP in the last 8760 hours. Basic Metabolic Panel: Recent Labs  Lab 11/21/23 1344 11/21/23 1402 11/21/23 2059 11/22/23 0102 11/22/23 0516 11/22/23 0814 11/22/23 1817  NA 137   < > 142 142 141 142 139  K 6.4*   < > 5.2* 4.6 4.6 4.6 4.1  CL 98   < > 106 108 109 109 107  CO2 10*   < > 20* 21* 20* 16* 19*  GLUCOSE 468*   < > 233* 163* 167* 160* 241*  BUN 42*   < > 39* 37* 37* 36* 30*  CREATININE 1.52*   < > 1.28* 1.11* 1.08* 0.99 0.92  CALCIUM  9.9   < > 9.6 9.6 9.6 9.5 9.4  MG 3.2*  --   --   --   --   --   --    < > = values in this interval not displayed.   Liver Function Tests: Recent Labs  Lab 11/21/23 1344  AST 10*  ALT 14  ALKPHOS 172*  BILITOT 0.3  PROT 7.8  ALBUMIN  3.7   No results for input(s): LIPASE, AMYLASE in the last 168 hours. Recent Labs  Lab 11/21/23 1149  AMMONIA 26   CBC: Recent Labs  Lab 11/21/23 1149 11/21/23 1402 11/22/23 0516 11/23/23 0701  WBC 23.8*  --  18.2* 15.2*  NEUTROABS 18.9*  --   --   --   HGB 18.4* 17.0*  16.8* 16.7*  HCT 55.0* 50.0* 48.1* 48.9*  MCV 85.3  --  83.2 84.2  PLT 371  --  346 275   Cardiac Enzymes: No results for  input(s): CKTOTAL, CKMB, CKMBINDEX, TROPONINI in the last 168 hours. BNP: Invalid input(s): POCBNP CBG: Recent Labs  Lab 11/23/23 1616 11/23/23 2128 11/23/23 2357 11/24/23 0326 11/24/23 0801  GLUCAP 202* 99 126* 146* 125*   D-Dimer No results for input(s): DDIMER in the last 72 hours. Hgb A1c Recent Labs    11/22/23 0814  HGBA1C 9.8*   Lipid Profile No results for input(s): CHOL, HDL, LDLCALC, TRIG, CHOLHDL, LDLDIRECT in the last 72 hours. Thyroid  function studies Recent Labs    11/21/23 1821  TSH <0.100*   Anemia work up No results for input(s): VITAMINB12, FOLATE, FERRITIN, TIBC, IRON , RETICCTPCT in the last 72 hours. Urinalysis    Component Value Date/Time   COLORURINE YELLOW 11/21/2023 1149   APPEARANCEUR CLOUDY (A) 11/21/2023 1149   LABSPEC 1.023 11/21/2023 1149   PHURINE 5.0 11/21/2023 1149   GLUCOSEU >=500 (A) 11/21/2023 1149   HGBUR LARGE (A) 11/21/2023 1149   HGBUR negative 07/27/2009 0831   BILIRUBINUR NEGATIVE 11/21/2023 1149   BILIRUBINUR negative 01/11/2020 1625   BILIRUBINUR NEG 10/17/2014 0901   KETONESUR 20 (A) 11/21/2023 1149   PROTEINUR 30 (A) 11/21/2023 1149   UROBILINOGEN 0.2 01/11/2020 1625   UROBILINOGEN 2.0 (H) 04/04/2012 0215   NITRITE POSITIVE (A) 11/21/2023 1149   LEUKOCYTESUR MODERATE (A) 11/21/2023 1149   Sepsis Labs Recent Labs  Lab 11/21/23 1149 11/22/23 0516 11/23/23 0701  WBC 23.8* 18.2* 15.2*   Microbiology Recent Results (from the past 240 hours)  Urine Culture     Status: Abnormal   Collection Time: 11/21/23 11:49 AM   Specimen: Urine, Clean Catch  Result Value Ref Range Status   Specimen Description   Final    URINE, CLEAN CATCH Performed at [facility], [address], [place], [place] [address]    Special Requests   Final    NONE Performed at [facility], [address] [place], [place] [address]    Culture >=100,000 COLONIES/mL  KLEBSIELLA PNEUMONIAE (A)  Final   Report Status 11/23/2023 FINAL  Final   Organism ID, Bacteria KLEBSIELLA PNEUMONIAE (A)  Final      Susceptibility   Klebsiella pneumoniae - MIC*    AMPICILLIN >=32 RESISTANT Resistant     CEFAZOLIN  (URINE) Value in next row Sensitive      <=1 SENSITIVEThis is a modified FDA-approved test that has been validated and its performance characteristics determined by the reporting laboratory.  This laboratory is certified under the Clinical Laboratory Improvement Amendments CLIA as qualified to perform high complexity clinical laboratory testing.    CEFEPIME Value in next row Sensitive      <=1 SENSITIVEThis is a modified FDA-approved test that has been validated and its performance characteristics determined by the reporting laboratory.  This laboratory is certified under the Clinical Laboratory Improvement Amendments CLIA as qualified to perform high complexity clinical laboratory testing.    ERTAPENEM Value in next row Sensitive      <=1 SENSITIVEThis is a modified FDA-approved test that has been validated and its performance characteristics determined by the reporting laboratory.  This laboratory is certified under the Clinical Laboratory Improvement Amendments CLIA as qualified to perform high complexity clinical laboratory testing.    CEFTRIAXONE  Value in next row Sensitive      <=1 SENSITIVEThis is a modified FDA-approved  test that has been validated and its performance characteristics determined by the reporting laboratory.  This laboratory is certified under the Clinical Laboratory Improvement Amendments CLIA as qualified to perform high complexity clinical laboratory testing.    CIPROFLOXACIN Value in next row Sensitive      <=1 SENSITIVEThis is a modified FDA-approved test that has been validated and its performance characteristics determined by the reporting laboratory.  This laboratory is certified under the Clinical Laboratory Improvement Amendments CLIA as  qualified to perform high complexity clinical laboratory testing.    GENTAMICIN Value in next row Sensitive      <=1 SENSITIVEThis is a modified FDA-approved test that has been validated and its performance characteristics determined by the reporting laboratory.  This laboratory is certified under the Clinical Laboratory Improvement Amendments CLIA as qualified to perform high complexity clinical laboratory testing.    NITROFURANTOIN Value in next row Sensitive      <=1 SENSITIVEThis is a modified FDA-approved test that has been validated and its performance characteristics determined by the reporting laboratory.  This laboratory is certified under the Clinical Laboratory Improvement Amendments CLIA as qualified to perform high complexity clinical laboratory testing.    TRIMETH /SULFA  Value in next row Sensitive      <=1 SENSITIVEThis is a modified FDA-approved test that has been validated and its performance characteristics determined by the reporting laboratory.  This laboratory is certified under the Clinical Laboratory Improvement Amendments CLIA as qualified to perform high complexity clinical laboratory testing.    AMPICILLIN/SULBACTAM Value in next row Sensitive      <=1 SENSITIVEThis is a modified FDA-approved test that has been validated and its performance characteristics determined by the reporting laboratory.  This laboratory is certified under the Clinical Laboratory Improvement Amendments CLIA as qualified to perform high complexity clinical laboratory testing.    PIP/TAZO Value in next row Sensitive      <=4 SENSITIVEThis is a modified FDA-approved test that has been validated and its performance characteristics determined by the reporting laboratory.  This laboratory is certified under the Clinical Laboratory Improvement Amendments CLIA as qualified to perform high complexity clinical laboratory testing.    MEROPENEM Value in next row Sensitive      <=4 SENSITIVEThis is a modified  FDA-approved test that has been validated and its performance characteristics determined by the reporting laboratory.  This laboratory is certified under the Clinical Laboratory Improvement Amendments CLIA as qualified to perform high complexity clinical laboratory testing.    * >=100,000 COLONIES/mL KLEBSIELLA PNEUMONIAE  Blood culture (routine x 2)     Status: Abnormal   Collection Time: 11/21/23  1:40 PM   Specimen: BLOOD  Result Value Ref Range Status   Specimen Description   Final    BLOOD BLOOD LEFT ARM Performed at [facility], [address] [place], [place] [address]    Special Requests   Final    BOTTLES DRAWN AEROBIC AND ANAEROBIC Blood Culture results may not be optimal due to an inadequate volume of blood received in culture bottles Performed at [facility], [address], [place], [place] [address]    Culture  Setup Time (A)  Final    GRAM VARIABLE ROD UNIDENTIFIED ORGANISM AEROBIC BOTTLE ONLY  L POINTDEXTER PHARM D 11/23/2023 @0408  BY DD    Culture (A)  Final    CORYNEBACTERIUM AMYCOLATUM Standardized susceptibility testing for this organism is not available. Performed at [facility], [address], [place], [place] [address]  Report Status 11/24/2023 FINAL  Final  Blood Culture ID Panel (Reflexed)     Status: None   Collection Time: 11/21/23  1:40 PM  Result Value Ref Range Status   Enterococcus faecalis NOT DETECTED NOT DETECTED Final   Enterococcus Faecium NOT DETECTED NOT DETECTED Final   Listeria monocytogenes NOT DETECTED NOT DETECTED Final   Staphylococcus species NOT DETECTED NOT DETECTED Final   Staphylococcus aureus (BCID) NOT DETECTED NOT DETECTED Final   Staphylococcus epidermidis NOT DETECTED NOT DETECTED Final   Staphylococcus lugdunensis NOT DETECTED NOT DETECTED Final   Streptococcus species NOT DETECTED NOT DETECTED Final   Streptococcus agalactiae NOT DETECTED NOT DETECTED Final   Streptococcus  pneumoniae NOT DETECTED NOT DETECTED Final   Streptococcus pyogenes NOT DETECTED NOT DETECTED Final   A.calcoaceticus-baumannii NOT DETECTED NOT DETECTED Final   Bacteroides fragilis NOT DETECTED NOT DETECTED Final   Enterobacterales NOT DETECTED NOT DETECTED Final   Enterobacter cloacae complex NOT DETECTED NOT DETECTED Final   Escherichia coli NOT DETECTED NOT DETECTED Final   Klebsiella aerogenes NOT DETECTED NOT DETECTED Final   Klebsiella oxytoca NOT DETECTED NOT DETECTED Final   Klebsiella pneumoniae NOT DETECTED NOT DETECTED Final   Proteus species NOT DETECTED NOT DETECTED Final   Salmonella species NOT DETECTED NOT DETECTED Final   Serratia marcescens NOT DETECTED NOT DETECTED Final   Haemophilus influenzae NOT DETECTED NOT DETECTED Final   Neisseria meningitidis NOT DETECTED NOT DETECTED Final   Pseudomonas aeruginosa NOT DETECTED NOT DETECTED Final   Stenotrophomonas maltophilia NOT DETECTED NOT DETECTED Final   Candida albicans NOT DETECTED NOT DETECTED Final   Candida auris NOT DETECTED NOT DETECTED Final   Candida glabrata NOT DETECTED NOT DETECTED Final   Candida krusei NOT DETECTED NOT DETECTED Final   Candida parapsilosis NOT DETECTED NOT DETECTED Final   Candida tropicalis NOT DETECTED NOT DETECTED Final   Cryptococcus neoformans/gattii NOT DETECTED NOT DETECTED Final    Comment: Performed at [facility], [address] [place], [place] [address]  Blood culture (routine x 2)     Status: None (Preliminary result)   Collection Time: 11/21/23  1:44 PM   Specimen: BLOOD  Result Value Ref Range Status   Specimen Description   Final    BLOOD BLOOD RIGHT ARM Performed at [facility], [address] [place], [place] [address]    Special Requests   Final    BOTTLES DRAWN AEROBIC AND ANAEROBIC Blood Culture adequate volume Performed at [facility], [address], [place], [place] [address]    Culture   Final    NO GROWTH  3 DAYS Performed at [facility], [address], [place], [place] [address]    Report Status PENDING  Incomplete  Group A Strep by PCR     Status: None   Collection Time: 11/21/23  1:44 PM   Specimen: Peripheral; Sterile Swab  Result Value Ref Range Status   Group A Strep by PCR NOT DETECTED NOT DETECTED Final    Comment: Performed at [facility], [address] [place], [place] [address]  Resp panel by RT-PCR (RSV, Flu A&B, Covid) Anterior Nasal Swab     Status: None   Collection Time: 11/21/23  3:33 PM   Specimen: Anterior Nasal Swab  Result Value Ref Range Status   SARS Coronavirus 2 by RT PCR NEGATIVE NEGATIVE Final    Comment: (NOTE) SARS-CoV-2 target nucleic acids are NOT DETECTED.  The SARS-CoV-2  RNA is generally detectable in upper respiratory specimens during the acute phase of infection. The lowest concentration of SARS-CoV-2 viral copies this assay can detect is 138 copies/mL. A negative result does not preclude SARS-Cov-2 infection and should not be used as the sole basis for treatment or other patient management decisions. A negative result may occur with  improper specimen collection/handling, submission of specimen other than nasopharyngeal swab, presence of viral mutation(s) within the areas targeted by this assay, and inadequate number of viral copies(<138 copies/mL). A negative result must be combined with clinical observations, patient history, and epidemiological information. The expected result is Negative.  Fact Sheet for Patients:  bloggercourse.com  Fact Sheet for Healthcare Providers:  seriousbroker.it  This test is no t yet approved or cleared by the [place]  FDA and  has been authorized for detection and/or diagnosis of SARS-CoV-2 by FDA under an Emergency Use Authorization (EUA). This EUA will remain  in effect (meaning this test can be used) for the duration of  the COVID-19 declaration under Section 564(b)(1) of the Act, 21 U.S.C.section 360bbb-3(b)(1), unless the authorization is terminated  or revoked sooner.       Influenza A by PCR NEGATIVE NEGATIVE Final   Influenza B by PCR NEGATIVE NEGATIVE Final    Comment: (NOTE) The Xpert Xpress SARS-CoV-2/FLU/RSV plus assay is intended as an aid in the diagnosis of influenza from Nasopharyngeal swab specimens and should not be used as a sole basis for treatment. Nasal washings and aspirates are unacceptable for Xpert Xpress SARS-CoV-2/FLU/RSV testing.  Fact Sheet for Patients: bloggercourse.com  Fact Sheet for Healthcare Providers: seriousbroker.it  This test is not yet approved or cleared by the [place]  FDA and has been authorized for detection and/or diagnosis of SARS-CoV-2 by FDA under an Emergency Use Authorization (EUA). This EUA will remain in effect (meaning this test can be used) for the duration of the COVID-19 declaration under Section 564(b)(1) of the Act, 21 U.S.C. section 360bbb-3(b)(1), unless the authorization is terminated or revoked.     Resp Syncytial Virus by PCR NEGATIVE NEGATIVE Final    Comment: (NOTE) Fact Sheet for Patients: bloggercourse.com  Fact Sheet for Healthcare Providers: seriousbroker.it  This test is not yet approved or cleared by the [place]  FDA and has been authorized for detection and/or diagnosis of SARS-CoV-2 by FDA under an Emergency Use Authorization (EUA). This EUA will remain in effect (meaning this test can be used) for the duration of the COVID-19 declaration under Section 564(b)(1) of the Act, 21 U.S.C. section 360bbb-3(b)(1), unless the authorization is terminated or revoked.  Performed at [facility], [address], [place], [place] [address]   MRSA Next Gen by PCR, Nasal     Status: None   Collection  Time: 11/21/23  6:26 PM   Specimen: Nasal Mucosa; Nasal Swab  Result Value Ref Range Status   MRSA by PCR Next Gen NOT DETECTED NOT DETECTED Final    Comment: (NOTE) The GeneXpert MRSA Assay (FDA approved for NASAL specimens only), is one component of a comprehensive MRSA colonization surveillance program. It is not intended to diagnose MRSA infection nor to guide or monitor treatment for MRSA infections. Test performance is not FDA approved in patients less than 59 years old. Performed at [facility], [address] [place], [place] [address]      Time coordinating discharge: Over 35 minutes  SIGNED:   Erle Odell Castor, MD  [facility] 11/24/2023, 10:19 AM Pager   If  7PM-7AM, please contact night-coverage [email or web address] Password [email or web address]

## 2023-11-24 NOTE — Telephone Encounter (Signed)
 Pt states she is hospitalized at [facility] and cannot make her appt Wednesday 10/29. Pt would like to know if there is anyway Dr. Lonni could see her in the hospital since her admission is cardiac related. Please advise.

## 2023-11-24 NOTE — Telephone Encounter (Signed)
 Pharmacy Patient Advocate Encounter  Insurance verification completed.    The patient is insured through [employer]. Patient has [employer], may use a copay card, and/or apply for patient assistance if available.    Ran test claim for Eliquis 5mg  and the current 30 day co-pay is $0.   This test claim was processed through [facility]- copay amounts may vary at other pharmacies due to [employer], or as the patient moves through the different stages of their insurance plan.

## 2023-11-24 NOTE — Telephone Encounter (Signed)
 She is being discharged today. As such, would advise she follow up as scheduled 11/26/23.   Skyah Hannon S Iori Gigante, NP

## 2023-11-24 NOTE — Plan of Care (Signed)
 Pt given discharge instructions, she verbalized her understanding. Pt knows to follow up with urology outpatient and PCP within 1 week. PT vitals stable, PIVs intact upon removal, pt going by private vehicle.   Problem: Education: Goal: Knowledge of General Education information will improve Description: Including pain rating scale, medication(s)/side effects and non-pharmacologic comfort measures Outcome: Adequate for Discharge   Problem: Health Behavior/Discharge Planning: Goal: Ability to manage health-related needs will improve Outcome: Adequate for Discharge   Problem: Clinical Measurements: Goal: Ability to maintain clinical measurements within normal limits will improve Outcome: Adequate for Discharge Goal: Will remain free from infection Outcome: Adequate for Discharge Goal: Diagnostic test results will improve Outcome: Adequate for Discharge Goal: Respiratory complications will improve Outcome: Adequate for Discharge Goal: Cardiovascular complication will be avoided Outcome: Adequate for Discharge   Problem: Activity: Goal: Risk for activity intolerance will decrease Outcome: Adequate for Discharge   Problem: Nutrition: Goal: Adequate nutrition will be maintained Outcome: Adequate for Discharge   Problem: Coping: Goal: Level of anxiety will decrease Outcome: Adequate for Discharge   Problem: Elimination: Goal: Will not experience complications related to bowel motility Outcome: Adequate for Discharge Goal: Will not experience complications related to urinary retention Outcome: Adequate for Discharge   Problem: Pain Managment: Goal: General experience of comfort will improve and/or be controlled Outcome: Adequate for Discharge   Problem: Safety: Goal: Ability to remain free from injury will improve Outcome: Adequate for Discharge   Problem: Skin Integrity: Goal: Risk for impaired skin integrity will decrease Outcome: Adequate for Discharge   Problem:  Education: Goal: Ability to describe self-care measures that may prevent or decrease complications (Diabetes Survival Skills Education) will improve Outcome: Adequate for Discharge Goal: Individualized Educational Video(s) Outcome: Adequate for Discharge   Problem: Coping: Goal: Ability to adjust to condition or change in health will improve Outcome: Adequate for Discharge   Problem: Fluid Volume: Goal: Ability to maintain a balanced intake and output will improve Outcome: Adequate for Discharge   Problem: Health Behavior/Discharge Planning: Goal: Ability to identify and utilize available resources and services will improve Outcome: Adequate for Discharge Goal: Ability to manage health-related needs will improve Outcome: Adequate for Discharge   Problem: Metabolic: Goal: Ability to maintain appropriate glucose levels will improve Outcome: Adequate for Discharge   Problem: Nutritional: Goal: Maintenance of adequate nutrition will improve Outcome: Adequate for Discharge Goal: Progress toward achieving an optimal weight will improve Outcome: Adequate for Discharge   Problem: Skin Integrity: Goal: Risk for impaired skin integrity will decrease Outcome: Adequate for Discharge   Problem: Tissue Perfusion: Goal: Adequacy of tissue perfusion will improve Outcome: Adequate for Discharge

## 2023-11-24 NOTE — Progress Notes (Addendum)
       Overnight   NAME: [name] MRN: [id number] DOB : 05/08/1965    Date of Service   11/24/2023   HPI/Events of Note     Notified by RN for concern over blood sugar and ordered long-acting insulin . Previous dose of long-acting insulin  was given at 1058 hrs with CBG 333 with a decrease down to 99 at 2128 hrs.  Waited 1 +/-hour and rechecked CBG which was 126. Will hold Semglee  for now and recheck at 0400 hrs.    Interventions/ Plan   Hold Semglee  for now Recheck CBG at midnight Notify of results.       Update 0339 hrs.  Current CBG = 146  -Semglee  is still held. Recheck will resume at normal time 0800 hours.   Lynwood Kipper BSN MSNA MSN ACNPC-AG Acute Care Nurse Practitioner [facility]

## 2023-11-24 NOTE — TOC Initial Note (Signed)
 Transition of Care [facility]) - Initial/Assessment Note    Patient Details  Name: [name] MRN: [id number] Date of Birth: 05-20-1965  Transition of [facility] CM/SW Contact:    Toy LITTIE Agar, RN Phone [phone]  11/24/2023, 2:35 PM  Clinical Narrative:                 Inpatient case manager at bedside to review PT recommendations with patient. Patient states that she is not going to a nursing home. Patient confirms that she comes from home with Home health through [facility]. CM has confirmed that patient is active with [facility] and services are to resume. Patient denies and DME needs. No other needs noted at this time. Patient discharging home with [facility] to follow.   Expected Discharge Plan: Home w Home Health Services Barriers to Discharge: No Barriers Identified   Patient Goals and CMS Choice Patient states their goals for this hospitalization and ongoing recovery are:: Patient states that she is ready to go home CMS Medicare.gov Compare Post Acute Care list provided to:: Patient Choice offered to / list presented to : Patient (Patient refusing) [place] ownership interest in [facility].provided to::  (n/a)    Expected Discharge Plan and Services In-house Referral: NA Discharge Planning Services: CM Consult Post Acute Care Choice: Skilled Nursing Facility Living arrangements for the past 2 months: Single Family Home Expected Discharge Date: 11/24/23               DME Arranged: N/A DME Agency: NA       HH Arranged: PT, OT (Resume with [facility] for [facility] PT/OT) HH Agency: [facility] ([facility]) Date HH Agency Contacted: 11/24/23 Time HH Agency Contacted: 1430 Representative spoke with at [facility] Agency: Harlene  Prior Living Arrangements/Services Living arrangements for the past 2 months: Single Family Home Lives with:: Self Patient language and need for interpreter reviewed:: Yes Do you feel safe going back to the place where you live?: Yes       Need for Family Participation in Patient Care: No (Comment) Care giver support system in place?: No (comment) Current home services: DME (currently has rollator and shower chair) Criminal Activity/Legal Involvement Pertinent to Current Situation/Hospitalization: No - Comment as needed  Activities of Daily Living   ADL Screening (condition at time of admission) Independently performs ADLs?: No Does the patient have a NEW difficulty with bathing/dressing/toileting/self-feeding that is expected to last >3 days?: Yes (Initiates electronic notice to provider for possible OT consult) Does the patient have a NEW difficulty with getting in/out of bed, walking, or climbing stairs that is expected to last >3 days?: Yes (Initiates electronic notice to provider for possible PT consult) Does the patient have a NEW difficulty with communication that is expected to last >3 days?: Yes (Initiates electronic notice to provider for possible SLP consult) Is the patient deaf or have difficulty hearing?: No Does the patient have difficulty seeing, even when wearing glasses/contacts?: No Does the patient have difficulty concentrating, remembering, or making decisions?: No  Permission Sought/Granted Permission sought to share information with : Family Supports Permission granted to share information with : No              Emotional Assessment Appearance:: Appears stated age Attitude/Demeanor/Rapport: Gracious Affect (typically observed): Pleasant Orientation: : Oriented to Self, Oriented to Place, Oriented to  Time, Oriented to Situation Alcohol / Substance Use: Not Applicable Psych Involvement: No (comment)  Admission diagnosis:  Hyperkalemia [E87.5] DKA (diabetic ketoacidosis) (HCC) [E11.10] Urinary tract  infection without hematuria, site unspecified [N39.0] Altered mental status, unspecified altered mental status type [R41.82] Diabetic ketoacidosis without coma associated with other specified  diabetes mellitus (HCC) [E13.10] Atrial fibrillation, unspecified type (HCC) [I48.91] Sepsis, due to unspecified organism, unspecified whether acute organ dysfunction present [facility]) [A41.9] Patient Active Problem List   Diagnosis Date Noted   Pressure injury of skin 11/22/2023   Hyperkalemia 11/22/2023   Severe sepsis (HCC) 11/22/2023   New onset a-fib (HCC) 11/22/2023   AKI (acute kidney injury) 11/22/2023   DKA (diabetic ketoacidosis) (HCC) 11/21/2023   Open wound of right elbow 11/02/2023   Chronic pain syndrome 01/15/2023   Headache 09/13/2022   Opiate withdrawal (HCC) 09/13/2022   Hypertension 09/12/2022   Chronic diastolic HF (heart failure) (HCC) 09/12/2022   Delirium 09/09/2022   AMS (altered mental status) 09/09/2022   Seizure (HCC) 09/09/2022   Malnutrition of moderate degree 09/09/2022   CHF (congestive heart failure) (HCC) 07/11/2022   Acute on chronic diastolic congestive heart failure (HCC) 07/10/2022   Acute on chronic heart failure with preserved ejection fraction (HFpEF) (HCC) 06/09/2022   Acute exacerbation of CHF (congestive heart failure) (HCC) 06/09/2022   Panniculitis 12/04/2021   Lumbar stenosis with neurogenic claudication 06/29/2021   Lumbar radiculopathy 06/29/2021   Biceps tendinopathy of right upper extremity 05/03/2021   Irregular heartbeat 04/11/2021   Neuropathic pain of foot 03/28/2021   Impingement syndrome of right shoulder 03/08/2021   Arthritis of right acromioclavicular joint 03/08/2021   Acute medial meniscus tear of right knee 03/08/2021   Acute lateral meniscus tear of right knee 03/08/2021   Spinal stenosis of thoracic region with radiculopathy    Bilateral leg numbness    Spinal stenosis of lumbar region with neurogenic claudication    Primary osteoarthritis of right knee    Impaired ambulation 01/10/2021   Status post lumbar laminectomy 10/03/2020   Cubital tunnel syndrome on left 05/02/2020   Prolapse of anterior vaginal wall  11/10/2019   Skin ulcer of left thigh, limited to breakdown of skin (HCC) 09/09/2019   Ulnar nerve compression 08/06/2019   Diabetic neuropathy associated with type 2 diabetes mellitus (HCC) 08/06/2019   Body mass index 50.0-59.9, adult (HCC) 06/12/2019   Impingement syndrome of left shoulder 05/07/2019   Polyp of colon    Osteoarthritis of right hip 02/12/2019   Nontraumatic tear of left supraspinatus tendon 12/30/2018   Gout 12/17/2018   Nontraumatic complete tear of right rotator cuff 12/08/2018   Tobacco abuse 08/21/2018   Right groin pain 07/20/2018   Paroxysmal SVT (supraventricular tachycardia) 03/20/2018   Pure hypercholesterolemia 03/20/2018   Muscle cramping 02/25/2018   Lung nodule 02/13/2018   Falls, subsequent encounter 01/30/2018   Cervical post-laminectomy syndrome 11/13/2017   Essential hypertension 10/27/2017   Solitary pulmonary nodule 10/07/2017   Restrictive lung disease secondary to obesity 10/01/2017   Polycythemia, secondary 10/01/2017   Chronic viral hepatitis B without delta-agent (HCC) 07/08/2017   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/27/2017   Chronic kidney disease (CKD), stage IV (severe) (HCC) 01/14/2017   Decubitus ulcer 12/13/2016   Type 2 diabetes mellitus with stage 3 chronic kidney disease, with long-term current use of insulin  (HCC) 12/12/2016   Urge incontinence of urine 12/05/2016   Trigger finger, left index finger 07/15/2016   Back pain 04/07/2014   Morbid obesity (HCC) 10/02/2012   Chronic diastolic CHF (congestive heart failure) (HCC) 04/07/2012   Skin lesion of scalp 03/15/2011   GERD 01/26/2010   Obstructive sleep  apnea treated with BiPAP 02/03/2008   FIBROCYSTIC BREAST DISEASE 10/28/2006   Hyperlipidemia 03/27/2006   Obesity hypoventilation syndrome (HCC) 03/27/2006   Depression with anxiety 03/27/2006   PCP:  Elicia Hamlet, MD Pharmacy:   [facility] [place], [place] - [address] [place] [address] Phone: [phone] Fax: [phone]     Social Drivers of Health (SDOH) Social History: SDOH Screenings   Food Insecurity: Patient Unable To Answer (11/22/2023)  Housing: Patient Unable To Answer (11/22/2023)  Transportation Needs: Patient Unable To Answer (11/22/2023)  Utilities: Patient Unable To Answer (11/22/2023)  Alcohol Screen: Low Risk  (07/14/2023)  Depression (PHQ2-9): Low Risk  (10/29/2023)  Recent Concern: Depression (PHQ2-9) - High Risk (10/15/2023)  Financial Resource Strain: Low Risk  (07/14/2023)  Physical Activity: Inactive (07/14/2023)  Social Connections: Socially Isolated (07/14/2023)  Stress: Stress Concern Present (07/14/2023)  Tobacco Use: High Risk (11/24/2023)  Health Literacy: Adequate Health Literacy (04/28/2023)   SDOH Interventions: Food Insecurity Interventions: Patient Unable to Answer Housing Interventions: Patient Unable to Answer Transportation Interventions: Patient Unable to Answer Utilities Interventions: Patient Unable to Answer   Readmission Risk Interventions    11/24/2023    1:50 PM 07/12/2022   10:06 AM  Readmission Risk Prevention Plan  Transportation Screening Complete Complete  PCP or Specialist Appt within 5-7 Days  Complete  PCP or Specialist Appt within 3-5 Days Complete   Home Care Screening  Complete  Medication Review (RN CM)  Complete  HRI or Home Care Consult Complete   Social Work Consult for Recovery Care Planning/Counseling Complete   Palliative Care Screening Not Applicable   Medication Review Oceanographer) Complete

## 2023-11-24 NOTE — Progress Notes (Signed)
   11/24/23 0005  BiPAP/CPAP/SIPAP  $ Non-Invasive Home Ventilator  Subsequent  BiPAP/CPAP/SIPAP Pt Type Adult  BiPAP/CPAP/SIPAP Resmed  Reason BIPAP/CPAP not in use Non-compliant  Mask Type Full face mask  BiPAP/CPAP /SiPAP Vitals  Pulse Rate 91  Resp (!) 23  SpO2 96 %  MEWS Score/Color  MEWS Score 1  MEWS Score Color Green

## 2023-11-24 NOTE — Inpatient Diabetes Management (Addendum)
 Inpatient Diabetes Program Recommendations  AACE/ADA: New Consensus Statement on Inpatient Glycemic Control (2015)  Target Ranges:  Prepandial:   less than 140 mg/dL      Peak postprandial:   less than 180 mg/dL (1-2 hours)      Critically ill patients:  140 - 180 mg/dL    Latest Reference Range & Units 09/09/23 16:00  HbA1c, POC (controlled diabetic range) 0.0 - 7.0 % 6.8    Latest Reference Range & Units 11/22/23 08:14  Hemoglobin A1C 4.8 - 5.6 % 9.8 (H)  234 mg/dl  (H): Data is abnormally high  Latest Reference Range & Units 11/23/23 07:48 11/23/23 11:44 11/23/23 16:16 11/23/23 21:28 11/23/23 23:57 11/24/23 03:26  Glucose-Capillary 70 - 99 mg/dL 780 (H)  9 units Novolog   333 (H)  15 units Novolog   35 units Semglee  @1058   202 (H)  9 units Novolog   99 126 (H) 146 (H)  (H): Data is abnormally high  Latest Reference Range & Units 11/24/23 08:01  Glucose-Capillary 70 - 99 mg/dL 874 (H)  6 units Novolog    (H): Data is abnormally high   Admit with:  Severe sepsis of unclear source possibly UTI  DKA New Onset AFib w/ RVR Hyperkalemia  History: DM  Home DM Meds: Jardiance  25 mg daily  Current Orders: Semglee  25 units BID     Novolog  Moderate Correction Scale/ SSI (0-15 units) TID AC + HS     Novolog  4 units TID with meals    Note pt did not get Semglee  insulin  last PM Semglee  dose reduced to 25 units BID this AM  PCP: Dr. Elicia with [facility] Last Seen 10/29/2023 Jardiance  was increased from 10 mg daily to 25 mg daily at that appt   Addendum 11am--Met w/ pt this AM prior to d/c to review going home on insulin .  Pt told me she used to take insulin  (couldn't remember which ones) but stated it was the insulin  pen.  Has been over 2 years since she took insulin .  Discussed w/ pt that the discharging MD would like for pt to resume insulin  at home--Pt stated she knows this info and is OK restarting insulin  at home.  I reviewed with pt that her A1c rose from  6.8% to 9.8%.  Pt told me she has CBG meter at home (note that discharging MD also prescribed new meter and supplies).  Re-Educated patient on insulin  pen use at home.  Reviewed all steps of insulin  pen including attachment of needle, 2-unit air shot, dialing up dose, giving injection, rotation of injection sites, removing needle, disposal of sharps, storage of unused insulin , disposal of insulin  etc.  Patient able to provide successful return demonstration.  Reviewed troubleshooting with insulin  pen.  Also reviewed Signs/Symptoms of Hypoglycemia with patient and how to treat Hypoglycemia at home.  Discussed with pt the importance of timing of the long acting insulin  and also reviewed with pt when to take the quick acting insulin .  Asked pt to please check her CBGs TID AC at home (reviewed healthy CBG levels for home).  Pt knows she has follow up appt with the pharmacy clinic tomorrow 10/28 and follow up appt with PCP 10/30.      --Will follow patient during hospitalization--  Adina Rudolpho Arrow RN, MSN, CDCES Diabetes Coordinator Inpatient Glycemic Control Team Team Pager: [phone] (8a-5p)

## 2023-11-24 NOTE — Plan of Care (Signed)
  Problem: Education: Goal: Knowledge of General Education information will improve Description: Including pain rating scale, medication(s)/side effects and non-pharmacologic comfort measures Outcome: Progressing   Problem: Health Behavior/Discharge Planning: Goal: Ability to manage health-related needs will improve Outcome: Progressing   Problem: Clinical Measurements: Goal: Ability to maintain clinical measurements within normal limits will improve Outcome: Progressing Goal: Will remain free from infection Outcome: Progressing Goal: Diagnostic test results will improve Outcome: Progressing Goal: Respiratory complications will improve Outcome: Progressing Goal: Cardiovascular complication will be avoided Outcome: Progressing   Problem: Activity: Goal: Risk for activity intolerance will decrease Outcome: Progressing   Problem: Nutrition: Goal: Adequate nutrition will be maintained Outcome: Progressing   Problem: Coping: Goal: Level of anxiety will decrease Outcome: Progressing   Problem: Elimination: Goal: Will not experience complications related to bowel motility Outcome: Progressing Goal: Will not experience complications related to urinary retention Outcome: Progressing   Problem: Pain Managment: Goal: General experience of comfort will improve and/or be controlled Outcome: Progressing   Problem: Safety: Goal: Ability to remain free from injury will improve Outcome: Progressing   Problem: Skin Integrity: Goal: Risk for impaired skin integrity will decrease Outcome: Progressing   Problem: Fluid Volume: Goal: Ability to maintain a balanced intake and output will improve Outcome: Progressing   Problem: Metabolic: Goal: Ability to maintain appropriate glucose levels will improve Outcome: Progressing   Problem: Skin Integrity: Goal: Risk for impaired skin integrity will decrease Outcome: Progressing   Problem: Tissue Perfusion: Goal: Adequacy of tissue  perfusion will improve Outcome: Progressing   Cindy S. Loreli BSN, RN, CCRP, CCRN 11/24/2023 2:16 AM

## 2023-11-24 NOTE — Care Management Important Message (Signed)
 Important Message  Patient Details IM Letter given. Name: [name] MRN: [id number] Date of Birth: 1965/05/18   Important Message Given:  Yes - Medicare IM     Melba Ates 11/24/2023, 1:24 PM

## 2023-11-24 NOTE — Telephone Encounter (Signed)
 Spoke with patient and advised to keep follow up as scheduled

## 2023-11-24 NOTE — Progress Notes (Signed)
 HS dose of Semglee  35 units held due to CBG drop to 99. Per Lynwood Kipper NP, recheck glucose at midnight. Result at midnight was 126. Per Lynwood Kipper NP, hold Semglee  and recheck glucose at 0400 to confirm no increase requiring insulin  coverage. Patient aware of the above, no further questions at this time. Cindy S. Loreli BSN, RN, CCRP, CCRN 11/24/2023 2:45 AM

## 2023-11-24 NOTE — Progress Notes (Signed)
 PT Cancellation Note  Patient Details Name: [name] MRN: [id number] DOB: 17-Jun-1965   Cancelled Treatment:    Reason Eval/Treat Not Completed: Other (comment). Pt declined therapy today. Pt is to d/c home with [name]. If pt remains in hospital, PT to continue to follow acutely.   [place], PT Acute Rehab   [place] VEAR Drone 11/24/2023, 3:19 PM

## 2023-11-25 ENCOUNTER — Telehealth: Payer: Self-pay | Admitting: Pharmacist

## 2023-11-25 ENCOUNTER — Ambulatory Visit: Admitting: Pharmacist

## 2023-11-25 NOTE — Telephone Encounter (Signed)
 Patient recently hospitalized 10/24-10/27  Previously scheduled for appointment today 10/28 - did not keep appointment.   Patient contacted for follow-up of tobacco use.     Since last contact patient reports she did not smoke during hospitalization and has NOT smoked since returning home yesterday evening.    Congratulated on success AND encouraged continued abstinence.    Total time with patient call and documentation of interaction: 8 minutes.  Asked patient to reschedule in-office appointment with me when she feels recovered from recent hospitalization for hypertension assessment and tobacco cessation support.

## 2023-11-26 ENCOUNTER — Ambulatory Visit (HOSPITAL_BASED_OUTPATIENT_CLINIC_OR_DEPARTMENT_OTHER): Admitting: Cardiology

## 2023-11-26 VITALS — BP 124/74 | HR 100 | Ht 69.0 in | Wt 220.7 lb

## 2023-11-26 DIAGNOSIS — I10 Essential (primary) hypertension: Secondary | ICD-10-CM | POA: Diagnosis not present

## 2023-11-26 DIAGNOSIS — Z09 Encounter for follow-up examination after completed treatment for conditions other than malignant neoplasm: Secondary | ICD-10-CM

## 2023-11-26 DIAGNOSIS — I5032 Chronic diastolic (congestive) heart failure: Secondary | ICD-10-CM

## 2023-11-26 DIAGNOSIS — I471 Supraventricular tachycardia, unspecified: Secondary | ICD-10-CM

## 2023-11-26 DIAGNOSIS — E1122 Type 2 diabetes mellitus with diabetic chronic kidney disease: Secondary | ICD-10-CM | POA: Diagnosis not present

## 2023-11-26 DIAGNOSIS — Z794 Long term (current) use of insulin: Secondary | ICD-10-CM

## 2023-11-26 DIAGNOSIS — N1831 Chronic kidney disease, stage 3a: Secondary | ICD-10-CM

## 2023-11-26 DIAGNOSIS — E7800 Pure hypercholesterolemia, unspecified: Secondary | ICD-10-CM

## 2023-11-26 DIAGNOSIS — E669 Obesity, unspecified: Secondary | ICD-10-CM

## 2023-11-26 LAB — CULTURE, BLOOD (ROUTINE X 2)
Culture: NO GROWTH
Special Requests: ADEQUATE

## 2023-11-26 NOTE — Patient Instructions (Signed)
 Medication Instructions:  No changes *If you need a refill on your cardiac medications before your next appointment, please call your pharmacy*  Lab Work: none   Testing/Procedures: none  Follow-Up: At [facility], you and your health needs are our priority.  As part of our continuing mission to provide you with exceptional heart care, our providers are all part of one team.  This team includes your primary Cardiologist (physician) and Advanced Practice Providers or APPs (Physician Assistants and Nurse Practitioners) who all work together to provide you with the care you need, when you need it.  Your next appointment:   3 month(s)  Provider:   Shelda Bruckner, MD, Rosaline Bane, NP, or Reche Finder, NP

## 2023-11-26 NOTE — Progress Notes (Signed)
 Cardiology Office Note:  .   Date:  11/26/2023  ID:  [name], DOB Aug 23, 1965, MRN [id number] PCP: Elicia Hamlet, MD  [facility] Providers Cardiologist:  Shelda Bruckner, MD {  History of Present Illness: .   [name] is a 58 y.o. female with chronic diastolic heart failure, prior chronic hypoxic respiratory failure improved with weight loss, COPD, OSA on PAP, paroxysmal SVT, type II diabetes on insulin , CKD stage 3, hypertension, hyperlipidemia, tobacco use, chronic pain here today for follow-up. I initially met her 10/27/2017.   Cardiac history: Complex, see my initial notes. 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 Admitted 05/2022 for fall and acute diastolic heart failure, readmitted 06/2022 with acute diastolic heart failure.   Today: Last seen 07/2022 by Reche Finder. Since that time, she was admitted 08/2022 with altered mental status, had seizure, felt to be 2/2 opioid withdrawal vs hypertensive emergency. She also underwent EBUS for lung nodule, negative for malignancy. Recently, she was discharged on 11/24/23, admitted with severe sepsis 2/2 UTI. Course complicated by encephalopathy, hyperkalemia, DKA, and afib RVR. Hospital course reviewed, extensive discharge medication reconciliation done today.  She went into afib RVR on 10/24 while septic. Converted to sinus on cardizem  drip the following day. Has not had afib before. Has not had any palpitations. Her discharge instructions list apixaban, but she was not given this. We discussed afib, risk of stroke. She has not had any other events. In the setting of sepsis, DKA, and AKI, with less than 24 hours of afib, I do not think she needs to be on long term anticoagulation now, but would need to re-evaluate if she has palpitations or rapid heart rates in the future.  Echo was hyperdynamic while she was acutely ill. Has not had any edema. Watching her weight closely  at home. Hasn't had any issues with retaining fluid since her hospitalizations last year.   She has stopped smoking. She is about 20 lbs from her goal weight.   Blood pressure well controlled at home.   ROS: Denies chest pain, shortness of breath at rest or with normal exertion. No PND, orthopnea, LE edema or unexpected weight gain. No syncope or palpitations. ROS otherwise negative except as noted.   Studies Reviewed: SABRA    EKG:       Physical Exam:   VS:  BP 124/74   Pulse 100   Ht 5' 9 (1.753 m)   Wt 220 lb 11.2 oz (100.1 kg)   LMP 01/29/1992   SpO2 90%   BMI 32.59 kg/m    Wt Readings from Last 3 Encounters:  11/26/23 220 lb 11.2 oz (100.1 kg)  11/21/23 216 lb 4.3 oz (98.1 kg)  10/30/23 227 lb (103 kg)    GEN: Well nourished, well developed in no acute distress HEENT: Normal, moist mucous membranes NECK: No JVD CARDIAC: regular rhythm, normal S1 and S2, no rubs or gallops. No murmur. VASCULAR: Radial and DP pulses 2+ bilaterally. No carotid bruits RESPIRATORY:  Clear to auscultation without rales, wheezing or rhonchi  ABDOMEN: Soft, non-tender, non-distended MUSCULOSKELETAL:  Ambulates independently SKIN: Warm and dry, no edema NEUROLOGIC:  Alert and oriented x 3. No focal neuro deficits noted. PSYCHIATRIC:  Normal affect    ASSESSMENT AND PLAN: .    Hospital discharge follow up, transitions of care, within 7 days of discharge -reviewed her complex hospital course. Reviewed her discharge medications. She is not currently taking apixaban, and I agree  with this. Afib RVR was brief and in the setting of sepsis, DKA, AKI, and electrolyte abnormalities. If she has a recurrence as an outpatient, then we would need to discuss anticoagulation, but would not start based only on this episode  Chronic diastolic heart failure: stable NYHA class II symptoms today -she watches her weights and volume closely. Denies any issues with volume overload since admissions over a year  ago -continue furosemide  80 mg BID, empagliflozin  25 mg daily, spironolactone  12.5 mg daily -once she is further out from her recent admission, would consider changing losartan  to entresto, could then possibly cut back on other antihypertensives -very strict on diet, daily weights, monitoring for symptoms.    Paroxysmal SVT: no recent symptoms -continue diltiazem , carvedilol  (previously well controlled on only diltiazem , can cut back rate agents if bradycardia becomes an issue)   Type II diabetes with stage 3a CKD -on SGLT2i and ARB -now back on insulin    Hypertension:  Goal <130/80.  -continue dilitazem, carvedilol , spironolactone  -also on clonidine , would like to wean this in the future if able -continue losartan , furosemide . See above re: entresto discussion at follow up   Hypercholesterolemia: continue atorvastatin , lipids as above   Class 3 obesity, BMI 59-->32: doing an excellent job with weight loss. Peak weight 417 lbs, lost 57 lbs prior to surgery and has continued to lose weight since bariatric surgery 04/2020    OSA on PAP, obesity hypoventilation syndrome: followed by pulmonology, no longer on home O2  CV risk counseling and prevention -recommend heart healthy/Mediterranean diet, with whole grains, fruits, vegetable, fish, lean meats, nuts, and olive oil. Limit salt. -recommend moderate walking, 3-5 times/week for 30-50 minutes each session. Aim for at least 150 minutes/week. Goal should be pace of 3 miles/hours, or walking 1.5 miles in 30 minutes -recommend avoidance of tobacco products. Avoid excess alcohol.  Dispo: 3 mos or sooner as needed  Signed, Shelda Bruckner, MD   Shelda Bruckner, MD, PhD, [facility]  [facility]  [place] [facility]  Heart & Vascular at [facility] [address], [address] [place], [place] [address] [phone]

## 2023-11-26 NOTE — Telephone Encounter (Signed)
 Reviewed and agree with Dr Rennis plan.

## 2023-11-27 ENCOUNTER — Ambulatory Visit

## 2023-11-27 ENCOUNTER — Telehealth: Payer: Self-pay | Admitting: Orthopaedic Surgery

## 2023-11-27 LAB — CULTURE, BLOOD (ROUTINE X 2): Culture  Setup Time: NO GROWTH — AB

## 2023-11-27 NOTE — Telephone Encounter (Signed)
 Sari (PT) from [facility] called requesting verbal orders for 1wk 8. Sari secure number is [phone].

## 2023-11-27 NOTE — Telephone Encounter (Signed)
 Called and left verbal on therapist voicemail.

## 2023-11-30 ENCOUNTER — Other Ambulatory Visit: Payer: Self-pay | Admitting: Family Medicine

## 2023-12-01 ENCOUNTER — Encounter: Payer: Self-pay | Admitting: Radiology

## 2023-12-01 ENCOUNTER — Other Ambulatory Visit

## 2023-12-02 ENCOUNTER — Ambulatory Visit
Admission: RE | Admit: 2023-12-02 | Discharge: 2023-12-02 | Disposition: A | Discharge status: Home / Self Care | Source: Ambulatory Visit | Attending: Family Medicine | Admitting: Family Medicine

## 2023-12-02 ENCOUNTER — Ambulatory Visit (INDEPENDENT_AMBULATORY_CARE_PROVIDER_SITE_OTHER): Admitting: Orthopaedic Surgery

## 2023-12-02 DIAGNOSIS — Z0000 Encounter for general adult medical examination without abnormal findings: Secondary | ICD-10-CM

## 2023-12-02 DIAGNOSIS — M1711 Unilateral primary osteoarthritis, right knee: Secondary | ICD-10-CM | POA: Diagnosis not present

## 2023-12-02 MED ORDER — SODIUM HYALURONATE 60 MG/3ML IX PRSY
60.0000 mg | PREFILLED_SYRINGE | INTRA_ARTICULAR | Status: AC | PRN
  Administered 2023-12-02: 60 mg via INTRA_ARTICULAR

## 2023-12-02 NOTE — Progress Notes (Signed)
   Procedure Note  Patient: [name]             Date of Birth: 1965-06-28           MRN: [id number]             Visit Date: 12/02/2023  Procedures: Visit Diagnoses:  1. Primary osteoarthritis of right knee     Large Joint Inj: R knee on 12/02/2023 9:57 AM Indications: pain Details: 22 G needle  Arthrogram: No  Medications: 60 mg Sodium Hyaluronate 60 MG/3ML Outcome: tolerated well, no immediate complications Patient was prepped and draped in the usual sterile fashion.

## 2023-12-03 LAB — OPHTHALMOLOGY REPORT-SCANNED

## 2023-12-04 ENCOUNTER — Telehealth: Payer: Self-pay

## 2023-12-04 NOTE — Telephone Encounter (Signed)
 Received call from home health RN regarding patient. She reports that patient has been experiencing vaginal irritation, redness and itchiness for the last several days.   Patient had recent abx during hospitalization on 10/24 for sepsis.   Home health RN is concerned that patient has yeast infection and is requesting treatment.   Advised that I would forward to provider.   Please advise if patient needs appointment prior to treatment being prescribed.   If the patient needs:   An appointment - route response to [facility] admin  A callback from clinic staff - route response to your team   Only route to RN Team: form completions or if RN team directly requests response sent to them   Chiquita JAYSON English, RN

## 2023-12-05 ENCOUNTER — Ambulatory Visit

## 2023-12-05 VITALS — BP 131/83 | HR 86 | Temp 98.7°F | Ht 69.0 in | Wt 219.0 lb

## 2023-12-05 DIAGNOSIS — J4489 Other specified chronic obstructive pulmonary disease: Secondary | ICD-10-CM

## 2023-12-05 DIAGNOSIS — R911 Solitary pulmonary nodule: Secondary | ICD-10-CM

## 2023-12-05 DIAGNOSIS — G4733 Obstructive sleep apnea (adult) (pediatric): Secondary | ICD-10-CM

## 2023-12-05 MED ORDER — FLUCONAZOLE 150 MG PO TABS: 150.0000 mg | ORAL_TABLET | Freq: Once | ORAL | 0 refills | Status: AC

## 2023-12-05 NOTE — Patient Instructions (Signed)
  VISIT SUMMARY: Today, we discussed your lung nodule, COPD, and other health concerns. We reviewed your recent CT scans and your history of atrial fibrillation, diabetes, and smoking. We also talked about your recent hospitalization for a urinary tract infection and your current medications.  YOUR PLAN: -RIGHT LUNG NODULE: A lung nodule is a small growth in the lung. Your nodule has been slowly growing but was found to be non-cancerous in a previous biopsy. We have ordered a PET scan to get more information about the nodule's activity and a blood test for additional details. Depending on the results, we may discuss another biopsy or surgical removal.  -CHRONIC OBSTRUCTIVE PULMONARY DISEASE (COPD): COPD is a chronic lung condition that makes it hard to breathe. You are currently managing it with the Trelegy inhaler, which you should continue using daily. Avoid using albuterol  as it may affect your heart condition.  INSTRUCTIONS: Please schedule your PET scan and blood test as soon as possible. We will review the results and discuss the next steps, which may include another biopsy or surgery. Continue using your Trelegy inhaler daily and avoid albuterol . Follow up with us  after completing the tests to discuss the results and your treatment plan.

## 2023-12-05 NOTE — Addendum Note (Signed)
 Addended by: ZAIDA MASON on: 12/05/2023 11:01 AM   Modules accepted: Orders

## 2023-12-05 NOTE — Progress Notes (Signed)
 Subjective:   PATIENT ID: [name] GENDER: female DOB: 1965/04/07, MRN: [id number]   HPI Discussed the use of AI scribe software for clinical note transcription with the patient, who gave verbal consent to proceed.  History of Present Illness [name] is a 58 year old female with a lung nodule who presents for follow-up and further evaluation.  She has a history of a lung nodule that was biopsied and found to be negative for cancer cells. However, the nodule has been slowly growing on recent CT scans.  She was recently hospitalized due to a urinary tract infection, which she believes affected her heart rate. She has a history of atrial fibrillation and avoids albuterol  as it may exacerbate her heart condition. She continues to use Trelegy daily for her breathing issues, which she attributes to allergies causing mucus production. She reports her breathing is good and attributes mucus production to allergies, and denies wheezing.  Her diabetes has been acting up, leading her to resume insulin  therapy.  She has a significant smoking history of 46 years but has not smoked for the past seven days. She lives in [place] and has recently cut her hair due to it becoming matted while bedridden last year.     Past Medical History:  Diagnosis Date   Alcoholism in remission (HCC)    11-26-2021  pt stated no alcohol few yrs ago   Asthma    Bipolar disorder (HCC)    Chronic diastolic CHF (congestive heart failure) (HCC) 2014   followed by dr b. lonni;   Chronic hypoxemic respiratory failure [facility])    pulmonologist--- dr brenna;   (11-26-2021 per pt was on supplemental oxygen  until she 01/ 2022 did not need it,  does not check O2 sats at home anymore.   CKD (chronic kidney disease), stage III [facility])    nephrologist--- dr dolan   COPD (chronic obstructive pulmonary disease) (HCC)    11-26-2021 pt trying to quit smoking ,  started age 20   Depression    Diabetes mellitus  type 2, diet-controlled (HCC)    followed by pcp   (11-26-2021  per pt last A1c by pcp on 10-30-2021 7.8,  stated does not check blood sugar)   Dyspnea    Fibrocystic breast disease    GAD (generalized anxiety disorder)    GERD (gastroesophageal reflux disease)    Hepatitis B surface antigen positive 11/2017   pt stated asymptomatic ,  no treatment   History of cervical dysplasia 04/10/2011   '93- once dx.-got pregnant-no intervention, then postpartum, no dysplasia found   History of condyloma acuminatum 04/09/2011   Hyperlipidemia    Hypertension    Incomplete left bundle branch block (LBBB)    Lung nodule 11/2017   pulmonologist--- dr brenna;  RUL and RLL   Muscle spasm of both lower legs    chronic , due to back issues,   OA (osteoarthritis) 04/10/2011   hips, shoulders, back   OSA (obstructive sleep apnea) 2009   study on epic 09-18-2007 mild osa w/ hypoxmia ;   (11-26-2021 per pt last used cpap 01/ 2022 before bariatric surgery 04/ 2022 stated lost wt / and stopped needing supplemental oxygen  , did not need anymore)   Polyneuropathy    due to back issues and DM   Polysubstance abuse (HCC)    11-26-2021  pt hx cocaine use stated none for yrs but ED visit 08-14-2021  positive UDS,  pt stated it was once only  for depression none since   PSVT (paroxysmal supraventricular tachycardia)    followed by cardiology   Spinal stenosis, unspecified spinal region    Urge incontinence of urine    Wears dentures    upper     Family History  Problem Relation Age of Onset   Pulmonary embolism Mother    Stroke Father    Alcohol abuse Father    Pneumonia Father    Hypertension Father    Breast cancer Maternal Aunt    Hypertension Other    Diabetes Other        Aunts and cousins   Asthma Sister    Depression Sister    Arthritis Sister    Sickle cell anemia Son      Social History   Socioeconomic History   Marital status: Divorced    Spouse name: Not on file   Number of  children: 1   Years of education: 12   Highest education level: 12th grade  Occupational History   Occupation: disabled    Comment: Disabled  Tobacco Use   Smoking status: Former    Current packs/day: 0.00    Average packs/day: 0.5 packs/day for 46.8 years (23.4 ttl pk-yrs)    Types: Cigarettes    Start date: 01/28/1977    Quit date: 11/21/2023    Years since quitting: 0.0    Passive exposure: Past   Smokeless tobacco: Never  Vaping Use   Vaping status: Never Used  Substance and Sexual Activity   Alcohol use: Not Currently    Comment: hx alcoholism,  (11-26-2021 per pt no alcohol in a while)   Drug use: Not Currently    Comment: 11-26-2021  (hx cocaine use) positive UDS in epic 08-14-2021  pt stated only did it once due to depression 07/ 2023 none since;  per pt last marijuana yrs ago   Sexual activity: Not Currently    Partners: Male    Birth control/protection: Post-menopausal  Other Topics Concern   Not on file  Social History Narrative   Current Social History       Who lives at home: Patient lives alone in one level home; has 4 steps onto porch with a handrail. Has smoke alarms, no throw rugs. Has elevated toilet. Has shower bench in tub with grab rails.    Transportation: Patient has own vehicle and drives herself    Important Relationships Family and friends    Pets: None    Likes to eat varied diet. Seafood, fish, roasted turkey breast, vegetables and salads and fruits.   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    Current Stressors: None    Religious / Personal Beliefs: God Jesus    Other: I love everyone, I wake up with joy in my heart and go to sleep the same way. I'm a lovable person.  Social Drivers of Corporate Investment Banker Strain: Low Risk  (11/26/2023)    Overall Financial Resource Strain (CARDIA)    Difficulty of Paying Living Expenses: Not hard at all  Food Insecurity: No Food Insecurity (11/26/2023)   Hunger Vital Sign    Worried About Running Out of Food in the Last Year: Never true    Ran Out of Food in the Last Year: Never true  Transportation Needs: No Transportation Needs (11/26/2023)   PRAPARE - Administrator, Civil Service (Medical): No    Lack of Transportation (Non-Medical): No  Physical Activity: Inactive (11/26/2023)   Exercise Vital Sign    Days of Exercise per Week: 0 days    Minutes of Exercise per Session: Not on file  Stress: No Stress Concern Present (11/26/2023)   [employer] of Occupational Health - Occupational Stress Questionnaire    Feeling of Stress: Only a little  Social Connections: Socially Isolated (11/26/2023)   Social Connection and Isolation Panel    Frequency of Communication with Friends and Family: More than three times a week    Frequency of Social Gatherings with Friends and Family: Never    Attends Religious Services: Never    Database Administrator or Organizations: No    Attends Engineer, Structural: Not on file    Marital Status: Divorced  Intimate Partner Violence: Unknown (11/22/2023)   Humiliation, Afraid, Rape, and Kick questionnaire    Fear of Current or Ex-Partner: Patient unable to answer    Emotionally Abused: Not on file    Physically Abused: Patient unable to answer    Sexually Abused: Not on file     Allergies  Allergen Reactions   Dorethia Franklin ] Other (See Comments)    Kidney disease   Methadose [Methadone Hcl] Other (See Comments)    Syncopal event   Corticosteroids Other (See Comments)    Hyperglycemia   Levaquin [Levofloxacin] Itching     Outpatient Medications Prior to Visit  Medication Sig Dispense Refill   acetaminophen  (TYLENOL ) 500 MG tablet Take 1,000 mg by mouth 2 (two) times daily.     ammonium lactate  (LAC-HYDRIN ) 12 % lotion For  dry skin (Patient taking differently: Apply 1 Application topically daily as needed for dry skin.) 400 g 0   atorvastatin  (LIPITOR) 40 MG tablet Take 1 tablet (40 mg total) by mouth daily. 180 tablet 0   baclofen  (LIORESAL ) 10 MG tablet TAKE ONE TABLET (10mg  total) BY MOUTH THREE TIMES DAILY AS NEEDED muscle SPASMS (Patient taking differently: Take 10 mg by mouth 3 (three) times daily.) 90 tablet 0   Blood Glucose Monitoring Suppl DEVI 1 each by Does not apply route as directed. Dispense based on patient and insurance preference. Use up to four times daily as directed. (FOR ICD-10 E10.9, E11.9). 1 each 0   busPIRone  (BUSPAR ) 10 MG tablet Take 1 tablet (10 mg total) by mouth 3 (three) times daily. 540 tablet 0   carvedilol  (COREG ) 12.5 MG tablet TAKE ONE TABLET (12.5MG  TOTAL) BY MOUTH TWICE DAILY 60 tablet 2   cloNIDine  (CATAPRES ) 0.2 MG tablet Take 1 tablet (0.2 mg total) by mouth 2 (two) times daily. 60 tablet 11   DILT-XR 120 MG 24 hr capsule TAKE ONE CAPSULE BY MOUTH TWICE DAILY 60 capsule 2   empagliflozin  (JARDIANCE ) 25 MG TABS tablet Take 1 tablet (25 mg total) by mouth daily before breakfast. 30 tablet 2   fluconazole  (DIFLUCAN ) 150 MG tablet Take 1 tablet (150  mg total) by mouth once for 1 dose. 1 tablet 0   fluticasone  (FLONASE ) 50 MCG/ACT nasal spray instill TWO SPRAYS IN EACH NOSTRIL TWICE DAILY 16 g 2   furosemide  (LASIX ) 80 MG tablet TAKE ONE TABLET BY MOUTH TWICE DAILY relaTED TO SYSTEMIC (congestive heart failure) 60 tablet 0   gabapentin  (NEURONTIN ) 300 MG capsule Take 1 capsule (300 mg total) by mouth 3 (three) times daily. 90 capsule 3   Glucose Blood (BLOOD GLUCOSE TEST STRIPS) STRP 1 each by Does not apply route as directed. Dispense based on patient and insurance preference. Use up to four times daily as directed. (FOR ICD-10 E10.9, E11.9). 100 strip 0   insulin  glargine (LANTUS  SOLOSTAR) 100 UNIT/ML Solostar Pen Inject 40 Units into the skin daily. 15 mL 11   insulin  lispro  (HUMALOG  KWIKPEN) 100 UNIT/ML KwikPen Inject 4 Units into the skin 3 (three) times daily. 15 mL 11   Insulin  Pen Needle 31G X 6 MM MISC 1 Device by Does not apply route 2 (two) times daily. 60 each 3   ketoconazole  (NIZORAL ) 2 % cream APPLY TO THE AFFECTED AREA(S) EVERY DAY 15 g 0   Lancet Device MISC 1 each by Does not apply route as directed. Dispense based on patient and insurance preference. Use up to four times daily as directed. (FOR ICD-10 E10.9, E11.9). 1 each 0   Lancets MISC 1 each by Does not apply route as directed. Dispense based on patient and insurance preference. Use up to four times daily as directed. (FOR ICD-10 E10.9, E11.9). 100 each 0   loratadine  (CLARITIN ) 10 MG tablet TAKE ONE TABLET BY MOUTH EVERY DAY FOR allergies 30 tablet 0   losartan  (COZAAR ) 25 MG tablet Take 25 mg by mouth daily.     montelukast  (SINGULAIR ) 10 MG tablet TAKE ONE TABLET BY MOUTH AT BEDTIME 90 tablet 2   Multiple Vitamins-Minerals (BARIATRIC MULTIVITAMIN/IRON  PO) Take 1 tablet by mouth daily.     mupirocin  ointment (BACTROBAN ) 2 % Apply 1 Application topically 2 (two) times daily. To right elbow 22 g 1   omeprazole  (PRILOSEC) 20 MG capsule Take 1 capsule (20 mg total) by mouth daily. 90 capsule 3   potassium chloride  SA (KLOR-CON  M) 20 MEQ tablet TAKE ONE TABLET ( TOTAL) BY MOUTH EVERY DAY 30 tablet 2   sodium chloride  (OCEAN) 0.65 % SOLN nasal spray Place 1 spray into both nostrils as needed for congestion. 15 mL 1   spironolactone  (ALDACTONE ) 25 MG tablet Take 0.5 tablets (12.5 mg total) by mouth daily. 15 tablet 3   TRELEGY ELLIPTA  100-62.5-25 MCG/ACT AEPB INHALE ONE PUFF INTO THE LUNGS EVERY DAY RELATED TO ACUTE AND CHRONIC RESPIRATOPR FAILURE 60 each 3   venlafaxine  XR (EFFEXOR -XR) 75 MG 24 hr capsule Take 1 capsule (75 mg total) by mouth daily with breakfast. 30 capsule 6   No facility-administered medications prior to visit.    ROS Reviewed all systems and reported negative except as  above     Objective:   Vitals:   12/05/23 1054  BP: 131/83  Pulse: 86  Temp: 98.7 F (37.1 C)  TempSrc: Oral  SpO2: 94%  Weight: 219 lb (99.3 kg)  Height: 5' 9 (1.753 m)    Physical Exam Physical Exam GENERAL: Appropriate to age, no acute distress. HEAD EYES EARS NOSE THROAT: Moist mucous membranes, atraumatic, normocephalic. CHEST: Clear to auscultation bilaterally, no wheezing, no crackles, no rales. CARDIAC: Regular rate and rhythm, normal S1, normal S2, no murmurs, no  rubs, no gallops. ABDOMEN: Soft, nontender. NEUROLOGICAL: Motor and sensation grossly intact, alert and oriented times X 3. EXTREMITIES: Warm, well perfused, no edema.     CBC    Component Value Date/Time   WBC 15.2 (H) 11/23/2023 0701   RBC 5.81 (H) 11/23/2023 0701   HGB 16.7 (H) 11/23/2023 0701   HGB 18.7 (H) 01/11/2020 1625   HCT 48.9 (H) 11/23/2023 0701   HCT 54.7 (H) 01/11/2020 1625   PLT 275 11/23/2023 0701   PLT 311 01/11/2020 1625   MCV 84.2 11/23/2023 0701   MCV 83 01/11/2020 1625   MCH 28.7 11/23/2023 0701   MCHC 34.2 11/23/2023 0701   RDW 13.2 11/23/2023 0701   RDW 15.6 (H) 01/11/2020 1625   LYMPHSABS 2.7 11/21/2023 1149   MONOABS 1.4 (H) 11/21/2023 1149   EOSABS 0.0 11/21/2023 1149   BASOSABS 0.2 (H) 11/21/2023 1149     Chest imaging:  PFT:    Latest Ref Rng & Units 08/18/2018    2:59 PM  PFT Results  FVC-Pre L 2.32   FVC-Predicted Pre % 66   FVC-Post L 2.29   FVC-Predicted Post % 65   Pre FEV1/FVC % % 68   Post FEV1/FCV % % 69   FEV1-Pre L 1.57   FEV1-Predicted Pre % 56   FEV1-Post L 1.58   DLCO uncorrected ml/min/mmHg 12.30   DLCO UNC% % 49   DLVA Predicted % 88   TLC L 3.41   TLC % Predicted % 57   RV % Predicted % 51         Assessment & Plan:   Assessment and Plan Assessment & Plan Right lung nodule, under evaluation The nodule is slowly growing, biopsy negative for cancer, further evaluation needed. - Ordered PET scan to assess nodule  activity. - Will discuss further biopsy or surgical removal based on PET results.  Chronic obstructive pulmonary disease (COPD) COPD managed with Trelegy inhaler, albuterol  avoided due to atrial fibrillation risk. - Continue Trelegy inhaler daily. - Avoid albuterol  given recent Afib RVR        Zola Herter, MD [facility] Office: [phone]

## 2023-12-08 ENCOUNTER — Other Ambulatory Visit: Payer: Self-pay | Admitting: Student

## 2023-12-08 DIAGNOSIS — E1151 Type 2 diabetes mellitus with diabetic peripheral angiopathy without gangrene: Secondary | ICD-10-CM

## 2023-12-08 DIAGNOSIS — E1122 Type 2 diabetes mellitus with diabetic chronic kidney disease: Secondary | ICD-10-CM

## 2023-12-11 ENCOUNTER — Encounter: Payer: Self-pay | Admitting: *Deleted

## 2023-12-11 ENCOUNTER — Ambulatory Visit

## 2023-12-11 NOTE — Progress Notes (Signed)
 [name]                                          MRN: [id number]   12/11/2023   The [facility] Quality Team Specialist reviewed this patient medical record for the purposes of chart review for care gap closure. The following were reviewed: abstraction for care gap closure-breast cancer screening.    [facility] Quality Team

## 2023-12-11 NOTE — Progress Notes (Signed)
 CARINNE BRANDENBURGER                                          MRN: [id number]   12/11/2023   The [facility] Quality Team Specialist reviewed this patient medical record for the purposes of chart review for care gap closure. The following were reviewed: chart review for care gap closure-diabetic eye exam.     [facility] Quality Team

## 2023-12-15 ENCOUNTER — Ambulatory Visit (HOSPITAL_COMMUNITY)
Admission: RE | Admit: 2023-12-15 | Discharge: 2023-12-15 | Disposition: A | Discharge status: Home / Self Care | Source: Ambulatory Visit

## 2023-12-15 ENCOUNTER — Other Ambulatory Visit: Payer: Self-pay | Admitting: Family Medicine

## 2023-12-15 ENCOUNTER — Telehealth: Payer: Self-pay | Admitting: Pharmacist

## 2023-12-15 DIAGNOSIS — R911 Solitary pulmonary nodule: Secondary | ICD-10-CM | POA: Diagnosis present

## 2023-12-15 DIAGNOSIS — I5032 Chronic diastolic (congestive) heart failure: Secondary | ICD-10-CM

## 2023-12-15 LAB — GLUCOSE, CAPILLARY: Glucose-Capillary: 145 mg/dL — ABNORMAL HIGH (ref 70–99)

## 2023-12-15 MED ORDER — FLUDEOXYGLUCOSE F - 18 (FDG) INJECTION
10.9000 | Freq: Once | INTRAVENOUS | Status: AC
  Administered 2023-12-15: 10.3 via INTRAVENOUS

## 2023-12-15 NOTE — Telephone Encounter (Signed)
 Reviewed and agree with Dr Rennis plan.

## 2023-12-15 NOTE — Telephone Encounter (Signed)
 Patient contacted for follow/up of tobacco cessation attempt.   Since last contact patient reports she has quit smoking for 17 days!  She was current awaiting a scan at [facility] She has plans to see Dr. Elicia tomorrow 11/18 at 2:30  I congratulated her on quitting smoking and encouraged continued abstinence in this patient who has quit in the past.   Total time with patient call and documentation of interaction: 7 minutes. Follow-up during PCP visit tomorrow.

## 2023-12-16 ENCOUNTER — Ambulatory Visit (INDEPENDENT_AMBULATORY_CARE_PROVIDER_SITE_OTHER): Admitting: Orthopaedic Surgery

## 2023-12-16 ENCOUNTER — Encounter (HOSPITAL_COMMUNITY): Payer: Self-pay | Admitting: *Deleted

## 2023-12-16 ENCOUNTER — Ambulatory Visit: Admitting: Family Medicine

## 2023-12-16 VITALS — BP 134/86 | HR 100 | Ht 69.0 in | Wt 228.0 lb

## 2023-12-16 DIAGNOSIS — A419 Sepsis, unspecified organism: Secondary | ICD-10-CM

## 2023-12-16 DIAGNOSIS — J4489 Other specified chronic obstructive pulmonary disease: Secondary | ICD-10-CM | POA: Diagnosis not present

## 2023-12-16 DIAGNOSIS — E1122 Type 2 diabetes mellitus with diabetic chronic kidney disease: Secondary | ICD-10-CM

## 2023-12-16 DIAGNOSIS — Z794 Long term (current) use of insulin: Secondary | ICD-10-CM

## 2023-12-16 DIAGNOSIS — N1831 Chronic kidney disease, stage 3a: Secondary | ICD-10-CM

## 2023-12-16 DIAGNOSIS — G8929 Other chronic pain: Secondary | ICD-10-CM | POA: Diagnosis not present

## 2023-12-16 DIAGNOSIS — M1711 Unilateral primary osteoarthritis, right knee: Secondary | ICD-10-CM

## 2023-12-16 DIAGNOSIS — N390 Urinary tract infection, site not specified: Secondary | ICD-10-CM

## 2023-12-16 DIAGNOSIS — I4891 Unspecified atrial fibrillation: Secondary | ICD-10-CM | POA: Diagnosis not present

## 2023-12-16 DIAGNOSIS — M25511 Pain in right shoulder: Secondary | ICD-10-CM

## 2023-12-16 NOTE — Progress Notes (Signed)
 Office Visit Note   Patient: [name]           Date of Birth: 12/09/1965           MRN: [id number] Visit Date: 12/16/2023              Requested by: Elicia Hamlet, MD [address] [place],  [place] [address] PCP: Elicia Hamlet, MD   Assessment & Plan: Visit Diagnoses:  1. Primary osteoarthritis of right knee   2. Chronic right shoulder pain     Plan: History of Present Illness [name] is a 58 year old female with chronic shoulder and knee pain who presents for evaluation of persistent pain despite prior treatments.  She experiences persistent right shoulder pain despite a subacromial injection on October 7th, which initially provided relief. An occupational therapist noted a possible dislocation, and she continues to have significant pain despite massage therapy.  Her knee pain has worsened following a steroid injection in October and a gel injection in November, with no relief from the injections. The pain is associated with increased swelling and difficulty with mobility, requiring assistance for steps and vehicle entry.  Assessment and Plan Right knee osteoarthritis with persistent pain Persistent knee pain despite steroid and gel injections. Not a surgical candidate due to uncontrolled diabetes. - Referred to pain clinic for knee pain management.  Right shoulder chronic pain due to rotator cuff dysfunction Chronic shoulder pain persists despite subacromial injection. Reverse shoulder replacement not recommended at age 14. Advised aggressive physical therapy to strengthen surrounding muscles. - Referred to pain clinic for shoulder pain management. - Advised aggressive physical therapy to strengthen muscles around the rotator cuff.  Follow-Up Instructions: No follow-ups on file.   Orders:  No orders of the defined types were placed in this encounter.  No orders of the defined types were placed in this encounter.     Procedures: No procedures  performed   Clinical Data: No additional findings.   Subjective: Chief Complaint  Patient presents with   Right Shoulder - Follow-up    HPI  Review of Systems  Constitutional: Negative.   HENT: Negative.    Eyes: Negative.   Respiratory: Negative.    Cardiovascular: Negative.   Endocrine: Negative.   Musculoskeletal: Negative.   Neurological: Negative.   Hematological: Negative.   Psychiatric/Behavioral: Negative.    All other systems reviewed and are negative.    Objective: Vital Signs: LMP 01/29/1992   Physical Exam Vitals and nursing note reviewed.  Constitutional:      Appearance: She is well-developed.  HENT:     Head: Atraumatic.     Nose: Nose normal.  Eyes:     Extraocular Movements: Extraocular movements intact.  Cardiovascular:     Pulses: Normal pulses.  Pulmonary:     Effort: Pulmonary effort is normal.  Abdominal:     Palpations: Abdomen is soft.  Musculoskeletal:     Cervical back: Neck supple.  Skin:    General: Skin is warm.     Capillary Refill: Capillary refill takes less than 2 seconds.  Neurological:     Mental Status: She is alert. Mental status is at baseline.  Psychiatric:        Behavior: Behavior normal.        Thought Content: Thought content normal.        Judgment: Judgment normal.     Ortho Exam  Specialty Comments:  No specialty comments available.  Imaging: No results found.  PMFS History: Patient Active Problem List   Diagnosis Date Noted   Chronic right shoulder pain 12/16/2023   Pressure injury of skin 11/22/2023   Hyperkalemia 11/22/2023   Severe sepsis (HCC) 11/22/2023   New onset a-fib (HCC) 11/22/2023   AKI (acute kidney injury) 11/22/2023   DKA (diabetic ketoacidosis) (HCC) 11/21/2023   Open wound of right elbow 11/02/2023   Chronic pain syndrome 01/15/2023   Headache 09/13/2022   Opiate withdrawal (HCC) 09/13/2022   Hypertension 09/12/2022   Chronic diastolic HF (heart failure) (HCC)  09/12/2022   Delirium 09/09/2022   AMS (altered mental status) 09/09/2022   Seizure (HCC) 09/09/2022   Malnutrition of moderate degree 09/09/2022   CHF (congestive heart failure) (HCC) 07/11/2022   Acute on chronic diastolic congestive heart failure (HCC) 07/10/2022   Acute on chronic heart failure with preserved ejection fraction (HFpEF) (HCC) 06/09/2022   Acute exacerbation of CHF (congestive heart failure) (HCC) 06/09/2022   Panniculitis 12/04/2021   Lumbar stenosis with neurogenic claudication 06/29/2021   Lumbar radiculopathy 06/29/2021   Biceps tendinopathy of right upper extremity 05/03/2021   Irregular heartbeat 04/11/2021   Neuropathic pain of foot 03/28/2021   Impingement syndrome of right shoulder 03/08/2021   Arthritis of right acromioclavicular joint 03/08/2021   Acute medial meniscus tear of right knee 03/08/2021   Acute lateral meniscus tear of right knee 03/08/2021   Spinal stenosis of thoracic region with radiculopathy    Bilateral leg numbness    Spinal stenosis of lumbar region with neurogenic claudication    Primary osteoarthritis of right knee    Impaired ambulation 01/10/2021   Status post lumbar laminectomy 10/03/2020   Cubital tunnel syndrome on left 05/02/2020   Prolapse of anterior vaginal wall 11/10/2019   Skin ulcer of left thigh, limited to breakdown of skin (HCC) 09/09/2019   Ulnar nerve compression 08/06/2019   Diabetic neuropathy associated with type 2 diabetes mellitus (HCC) 08/06/2019   Body mass index 50.0-59.9, adult (HCC) 06/12/2019   Impingement syndrome of left shoulder 05/07/2019   Polyp of colon    Osteoarthritis of right hip 02/12/2019   Nontraumatic tear of left supraspinatus tendon 12/30/2018   Gout 12/17/2018   Nontraumatic complete tear of right rotator cuff 12/08/2018   Tobacco abuse 08/21/2018   Right groin pain 07/20/2018   Paroxysmal SVT (supraventricular tachycardia) 03/20/2018   Pure hypercholesterolemia 03/20/2018   Muscle  cramping 02/25/2018   Lung nodule 02/13/2018   Falls, subsequent encounter 01/30/2018   Cervical post-laminectomy syndrome 11/13/2017   Essential hypertension 10/27/2017   Solitary pulmonary nodule 10/07/2017   Restrictive lung disease secondary to obesity 10/01/2017   Polycythemia, secondary 10/01/2017   Chronic viral hepatitis B without delta-agent (HCC) 07/08/2017   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/27/2017   Chronic kidney disease (CKD), stage IV (severe) (HCC) 01/14/2017   Decubitus ulcer 12/13/2016   Type 2 diabetes mellitus with stage 3 chronic kidney disease, with long-term current use of insulin  (HCC) 12/12/2016   Urge incontinence of urine 12/05/2016   Trigger finger, left index finger 07/15/2016   Back pain 04/07/2014   Morbid obesity (HCC) 10/02/2012   Chronic diastolic CHF (congestive heart failure) (HCC) 04/07/2012   Skin lesion of scalp 03/15/2011   GERD 01/26/2010   Obstructive sleep apnea treated with BiPAP 02/03/2008   FIBROCYSTIC BREAST DISEASE 10/28/2006   Hyperlipidemia 03/27/2006   Obesity hypoventilation syndrome (HCC) 03/27/2006   Depression with anxiety 03/27/2006   Past Medical History:  Diagnosis Date  Alcoholism in remission (HCC)    11-26-2021  pt stated no alcohol few yrs ago   Asthma    Bipolar disorder (HCC)    Chronic diastolic CHF (congestive heart failure) (HCC) 2014   followed by dr b. lonni;   Chronic hypoxemic respiratory failure [facility])    pulmonologist--- dr brenna;   (11-26-2021 per pt was on supplemental oxygen  until she 01/ 2022 did not need it,  does not check O2 sats at home anymore.   CKD (chronic kidney disease), stage III [facility])    nephrologist--- dr dolan   COPD (chronic obstructive pulmonary disease) (HCC)    11-26-2021 pt trying to quit smoking ,  started age 4   Depression    Diabetes mellitus type 2, diet-controlled (HCC)    followed by pcp   (11-26-2021  per pt last A1c  by pcp on 10-30-2021 7.8,  stated does not check blood sugar)   Dyspnea    Fibrocystic breast disease    GAD (generalized anxiety disorder)    GERD (gastroesophageal reflux disease)    Hepatitis B surface antigen positive 11/2017   pt stated asymptomatic ,  no treatment   History of cervical dysplasia 04/10/2011   '93- once dx.-got pregnant-no intervention, then postpartum, no dysplasia found   History of condyloma acuminatum 04/09/2011   Hyperlipidemia    Hypertension    Incomplete left bundle branch block (LBBB)    Lung nodule 11/2017   pulmonologist--- dr brenna;  RUL and RLL   Muscle spasm of both lower legs    chronic , due to back issues,   OA (osteoarthritis) 04/10/2011   hips, shoulders, back   OSA (obstructive sleep apnea) 2009   study on epic 09-18-2007 mild osa w/ hypoxmia ;   (11-26-2021 per pt last used cpap 01/ 2022 before bariatric surgery 04/ 2022 stated lost wt / and stopped needing supplemental oxygen  , did not need anymore)   Polyneuropathy    due to back issues and DM   Polysubstance abuse (HCC)    11-26-2021  pt hx cocaine use stated none for yrs but ED visit 08-14-2021  positive UDS,  pt stated it was once only for depression none since   PSVT (paroxysmal supraventricular tachycardia)    followed by cardiology   Spinal stenosis, unspecified spinal region    Urge incontinence of urine    Wears dentures    upper    Family History  Problem Relation Age of Onset   Pulmonary embolism Mother    Stroke Father    Alcohol abuse Father    Pneumonia Father    Hypertension Father    Breast cancer Maternal Aunt    Hypertension Other    Diabetes Other        Aunts and cousins   Asthma Sister    Depression Sister    Arthritis Sister    Sickle cell anemia Son     Past Surgical History:  Procedure Laterality Date   ANAL FISTULECTOMY  04/17/2011   Procedure: FISTULECTOMY ANAL;  Surgeon: Donnice POUR. Belinda, MD;  Location: [facility];  Service: General;  Laterality: N/A;   Excision of Condyloma Gluteal Cleft    ANAL RECTAL MANOMETRY N/A 03/26/2019   Procedure: ANO RECTAL MANOMETRY;  Surgeon: Eda Iha, MD;  Location: [facility];  Service: Gastroenterology;  Laterality: N/A;   BIOPSY  03/09/2019   Procedure: BIOPSY;  Surgeon: Eda Iha, MD;  Location: [facility];  Service: Gastroenterology;;   BOTOX  INJECTION N/A 12/29/2018  Procedure: BOTOX  INJECTION(100 UNITS)  WITH CYSTOSCOPY;  Surgeon: Devere Lonni Righter, MD;  Location: [facility];  Service: Urology;  Laterality: N/A;   BOTOX  INJECTION N/A 07/27/2019   Procedure: BOTOX  INJECTION WITH CYSTOSCOPY;  Surgeon: Devere Lonni Righter, MD;  Location: [facility];  Service: Urology;  Laterality: N/A;  ONLY NEEDS 30 MIN   BOTOX  INJECTION N/A 05/02/2021   Procedure: BOTOX  INJECTION WITH CYSTOSCOPY;  Surgeon: Devere Lonni Righter, MD;  Location: [facility];  Service: Urology;  Laterality: N/A;  ONLY NEEDS 30 MIN   BOTOX  INJECTION N/A 11/28/2021   Procedure: BOTOX  INJECTION WITH CYSTOSCOPY, 200 UNITS;  Surgeon: Devere Lonni Righter, MD;  Location: [facility];  Service: Urology;  Laterality: N/A;  ONLY NEEDS 30 MIN   BOTOX  INJECTION N/A 05/15/2022   Procedure: BOTOX  INJECTION 200 UNITS;  Surgeon: Devere Lonni Righter, MD;  Location: [facility];  Service: Urology;  Laterality: N/A;   BREAST EXCISIONAL BIOPSY Right    x 3   BREAST EXCISIONAL BIOPSY Left    12-29-2000  and 11-30-2001 @[facility]  by dr curvin   BRONCHIAL BRUSHINGS  10/30/2023   Procedure: BRONCHOSCOPY, WITH BRUSH BIOPSY;  Surgeon: Zaida Zola SAILOR, MD;  Location: [facility];  Service: Pulmonary;;   BRONCHIAL NEEDLE ASPIRATION BIOPSY  10/30/2023   Procedure: BRONCHOSCOPY, WITH NEEDLE ASPIRATION BIOPSY;  Surgeon: Zaida Zola SAILOR, MD;  Location: [facility];  Service: Pulmonary;;   CARPAL TUNNEL RELEASE Bilateral    right 08-30-2002 and left 07-04-2003  by dr lucilla @ [facility]   CATARACT  EXTRACTION W/ INTRAOCULAR LENS IMPLANT Bilateral 12/2020   CERVICAL FUSION  04/10/2011   x3- cervical fusion with plating and screws-Dr. Gust   COLONOSCOPY WITH PROPOFOL  N/A 07/06/2015   Procedure: COLONOSCOPY WITH PROPOFOL ;  Surgeon: Norleen Hint, MD;  Location: [facility];  Service: Endoscopy;  Laterality: N/A;   COLONOSCOPY WITH PROPOFOL  N/A 03/09/2019   Procedure: COLONOSCOPY WITH PROPOFOL ;  Surgeon: Eda Iha, MD;  Location: [facility];  Service: Gastroenterology;  Laterality: N/A;   CYSTOSCOPY N/A 05/15/2022   Procedure: CYSTOSCOPY;  Surgeon: Devere Lonni Righter, MD;  Location: [facility];  Service: Urology;  Laterality: N/A;   FRACTURE SURGERY     little toe left foot   I & D EXTREMITY Left 12/18/2016   Procedure: IRRIGATION AND DEBRIDEMENT LEFT FOOT, CLOSURE;  Surgeon: Jerri Kay HERO, MD;  Location: [facility];  Service: Orthopedics;  Laterality: Left;   INCISION AND DRAINAGE ABSCESS Right 05/03/2002   @[facility]  by dr curvin;   right breast abscess   INJECTION KNEE Right 10/03/2020   Procedure: KNEE INJECTION;  Surgeon: Lucilla Lynwood BRAVO, MD;  Location: [facility] OR;  Service: Orthopedics;  Laterality: Right;   KNEE ARTHROSCOPY WITH MEDIAL MENISECTOMY Right 03/21/2021   Procedure: RIGHT KNEE ARTHROSCOPY WITH MEDIAL AND PARTIAL LATERAL MENISCECTOMY;  Surgeon: Jerri Kay HERO, MD;  Location: [facility];  Service: Orthopedics;  Laterality: Right;   LAPAROSCOPIC GASTRIC SLEEVE RESECTION N/A 05/08/2020   Procedure: LAPAROSCOPIC GASTRIC SLEEVE RESECTION;  Surgeon: Stevie Herlene Righter, MD;  Location: [facility];  Service: General;  Laterality: N/A;   LUMBAR DISC SURGERY  04/10/2011   x5-Lumbar fusion-retained hardware.(Dr. Lucilla)   LUMBAR LAMINECTOMY N/A 10/03/2020   Procedure: THORACOLUMBAR LAMINECTOMIES THORACIC ELEVEN-TWELVE, THORACIC TWELVE-LUMBAR ONE  AND LUMBAT ONE-TWO;  Surgeon: Lucilla Lynwood BRAVO, MD;  Location: [facility];  Service: Orthopedics;  Laterality: N/A;   LUMBAR  PERCUTANEOUS PEDICLE SCREW 2 LEVEL  06/29/2021   Procedure: LUMBAR  PERCUTANEOUS PEDICLE SCREW LUMBAR ONE-TWO;  Surgeon: Louis Shove, MD;  Location: [facility] OR;  Service: Neurosurgery;;   MULTIPLE TOOTH EXTRACTIONS     POLYPECTOMY  03/09/2019   Procedure: POLYPECTOMY;  Surgeon: Eda Iha, MD;  [facility]: [facility];  Service: Gastroenterology;;   POSTERIOR LUMBAR FUSION  04/04/2005   @[facility]  by dr lucilla;   L3-- L5   POSTERIOR LUMBAR FUSION  03/04/2006   @[facility]  dr lucilla;   fusion L2--3 and redo L3--5   SHOULDER ARTHROSCOPY WITH ROTATOR CUFF REPAIR AND SUBACROMIAL DECOMPRESSION Left 05/07/2019   Procedure: LEFT SHOULDER ARTHROSCOPY WITH DEBRIDEMENT, DISTAL CLAVICLE EXCISION,  SUBACROMIAL DECOMPRESSION AND POSSIBLE ROTATOR CUFF REPAIR;  Surgeon: Jerri Kay HERO, MD;  Location: [facility];  Service: Orthopedics;  Laterality: Left;   TEE WITHOUT CARDIOVERSION N/A 04/06/2012   Procedure: TRANSESOPHAGEAL ECHOCARDIOGRAM (TEE);  Surgeon: Vinie KYM Maxcy, MD;  Location: [facility];  Service: Cardiovascular;  Laterality: N/A;   TRIGGER FINGER RELEASE Right    middle finger   TRIGGER FINGER RELEASE Left 06/28/2016   Procedure: RELEASE TRIGGER FINGER LEFT 3RD FINGER;  Surgeon: Jerri Kay HERO, MD;  Location: [facility];  Service: Orthopedics;  Laterality: Left;   TRIGGER FINGER RELEASE Right 06/12/2016   Procedure: RIGHT INDEX FINGER TRIGGER RELEASE;  Surgeon: Jerri Kay HERO, MD;  Location: [facility];  Service: Orthopedics;  Laterality: Right;   TRIGGER FINGER RELEASE Right 01/19/2018   Procedure: RIGHT RING FINGER TRIGGER FINGER RELEASE;  Surgeon: Jerri Kay HERO, MD;  Location: [facility];  Service: Orthopedics;  Laterality: Right;   TRIGGER FINGER RELEASE Left 05/07/2019   Procedure: RELEASE TRIGGER FINGER LEFT INDEX FINGER;  Surgeon: Jerri Kay HERO, MD;  Location: [facility];  Service: Orthopedics;  Laterality: Left;   UPPER GI ENDOSCOPY N/A 05/08/2020   Procedure: UPPER GI ENDOSCOPY;  Surgeon: Stevie, Herlene Righter, MD;  Location:  [facility];  Service: General;  Laterality: N/A;   VIDEO BRONCHOSCOPY WITH ENDOBRONCHIAL NAVIGATION N/A 10/30/2023   Procedure: VIDEO BRONCHOSCOPY WITH ENDOBRONCHIAL NAVIGATION;  Surgeon: Zaida Zola SAILOR, MD;  Location: [facility];  Service: Pulmonary;  Laterality: N/A;   Social History   Occupational History   Occupation: disabled    Comment: Disabled  Tobacco Use   Smoking status: Former    Current packs/day: 0.00    Average packs/day: 0.5 packs/day for 46.8 years (23.4 ttl pk-yrs)    Types: Cigarettes    Start date: 01/28/1977    Quit date: 11/21/2023    Years since quitting: 0.0    Passive exposure: Past   Smokeless tobacco: Never  Vaping Use   Vaping status: Never Used  Substance and Sexual Activity   Alcohol use: Not Currently    Comment: hx alcoholism,  (11-26-2021 per pt no alcohol in a while)   Drug use: Not Currently    Comment: 11-26-2021  (hx cocaine use) positive UDS in epic 08-14-2021  pt stated only did it once due to depression 07/ 2023 none since;  per pt last marijuana yrs ago   Sexual activity: Not Currently    Partners: Male    Birth control/protection: Post-menopausal

## 2023-12-16 NOTE — Progress Notes (Signed)
    SUBJECTIVE:   CHIEF COMPLAINT / HPI:   Was admitted 10/24 for UTI kelbsiella sepsis and DKA and new afib.   Discussed the use of AI scribe software for clinical note transcription with the patient, who gave verbal consent to proceed.  History of Present Illness [name] is a 58 year old female with diabetes and kidney disease who presents with a recent hospitalization for sepsis secondary to a urinary tract infection (UTI).  Sepsis secondary to urinary tract infection - Recent hospitalization for sepsis due to urinary tract infection - Initial symptoms included sore throat and left-sided earache, later spreading to the right side with severe pain and dysphagia - Developed confusion, prompting emergency services activation - During hospitalization, experienced tachycardia  Diabetes mellitus and glycemic control - Diabetes became uncontrolled during recent hospitalization, requiring resumption of insulin  therapy - Current regimen includes Humalog  4 units with meals and Lantus  40 units in the morning - Blood glucose readings are variable, with fasting levels around 119 mg/dL and occasional postprandial readings exceeding 300 mg/dL, especially after consuming sweets - Recent dietary habits, particularly during the holiday season, have contributed to elevated blood glucose levels - Plans to resume healthier eating habits after the holidays  Chronic kidney disease and cardiovascular risk management - Currently taking Jardiance  for kidney disease and cardiovascular protection  Nicotine  dependence and smoking cessation - History of tobacco use, currently 18 days abstinent - Previous abstinence for eight months, with relapse attributed to depression - Currently engaged in psychiatric and therapeutic care to support mental health and prevent relapse  Spinal deformity - History of spinal curvature - Scheduled to see Dr. Georgina for further evaluation    PERTINENT  PMH / PSH: T2DM,  HTN  OBJECTIVE:   BP 134/86   Pulse 100   Ht 5' 9 (1.753 m)   Wt 228 lb (103.4 kg)   LMP 01/29/1992   SpO2 90%   BMI 33.67 kg/m   General: Alert, pleasant woman. NAD. HEENT: NCAT. MMM. CV: RRR, no murmurs. Cap refill <2. Resp: CTAB, no wheezing or crackles. Normal WOB on RA.  Abm: Soft, nontender, nondistended. BS present. Ext: Moves all ext spontaneously Skin: Warm, well perfused   ASSESSMENT/PLAN:   Assessment & Plan Sepsis secondary to UTI (HCC) Now improved and remains afebrile, no signs of ongoing infxn. Counseled on jardiance  risk (see below) Type 2 diabetes mellitus with stage 3a chronic kidney disease, with long-term current use of insulin  (HCC) Glucose seems relatively well controlled on current insulin  regimen, except when eating sweets. Of note, we discussed the risk of continuing her jardiance  given her recent UTI sepsis and how SGLT2s can increase risk of UTIs. Pt understands risks and benefits of the medication. She would like to continue taking for now, but plans to speak to her nephrologist to see what they think.  - Cont Lantus  40u daily - cont hiumalog 4u w/ meals TID - Cont jardiance  25mg  daily for now. Consider stopping if pt develops another UTI New onset a-fib [facility]) Seen by cardiologist, thought to be only in acute setting of sepsis and decision was made to not start anticoag. HR stable today. COPD (chronic obstructive pulmonary disease) with chronic bronchitis (HCC) Has stopped smoking since her hospitalization.Congratulated on her progress. Plan to f/u with Dr Koval to continue working with tobacco cessation.     Twyla Nearing, MD [facility]

## 2023-12-16 NOTE — Patient Instructions (Signed)
 1) I'm glad you are doing well after your hospitalization.  2) I want you to be cautious with your jardiance . It can increase your risk of urinary infections, so we need to be careful after you had a major urinary infection. We can continue it for now, but we may need to stop it in the future if it still puts you at risk. - Please seek emergency help immediately if you start to feel burning pee, increased frequency of peeing, fevers, or other symptoms similar to when you were hospitalized.  3) Mention your diabetes to Dr. Koval as well. He can help with your regimen.

## 2023-12-18 ENCOUNTER — Ambulatory Visit (INDEPENDENT_AMBULATORY_CARE_PROVIDER_SITE_OTHER): Admitting: Orthopedic Surgery

## 2023-12-18 DIAGNOSIS — M545 Low back pain, unspecified: Secondary | ICD-10-CM

## 2023-12-18 DIAGNOSIS — Z720 Tobacco use: Secondary | ICD-10-CM

## 2023-12-18 MED ORDER — HYDROCODONE-ACETAMINOPHEN 5-325 MG PO TABS: 1.0000 | ORAL_TABLET | Freq: Four times a day (QID) | ORAL | 0 refills | Status: AC | PRN

## 2023-12-18 NOTE — Assessment & Plan Note (Addendum)
 Has stopped smoking since her hospitalization.Congratulated on her progress. Plan to f/u with Dr Koval to continue working with tobacco cessation.

## 2023-12-18 NOTE — Assessment & Plan Note (Addendum)
 Seen by cardiologist, thought to be only in acute setting of sepsis and decision was made to not start anticoag. HR stable today.

## 2023-12-18 NOTE — Progress Notes (Signed)
 Orthopedic Spine Surgery Office Note   Assessment: Patient is a 58 y.o. female with history of L1-S1 spinal fusion with significant back pain.  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  No radicular symptoms or myelopathic symptoms     Plan: -Patient has quit all nicotine  containing products, so discussed surgery as an option for her.  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  I expect that she will correct on the table under anesthesia.  Accordingly, discussed a T9-L1 posterior instrumented spinal fusion.  After this discussion, patient elected to proceed -Nicotine  test ordered -ESR, CRP ordered -Pain has gotten to the point that she is requiring opioids to manage it. Sent in a prescription for hydrocodone   -Patient will next be seen at the date of surgery   The patient has symptoms consistent with proximal junctional kyphosis. The patient's symptoms were not improving with conservative treatment so operative management was discussed in the form of T9-L1 posterior instrumented fusion. The risks including but not limited to dural tear, nerve root injury, paralysis, persistent pain, pseudarthrosis, infection, bleeding, hardware failure/malposition, adjacent segment disease, proximal junctional kyphosis above the knee fusion level, heart attack, death, fracture, dvt/pe, blindness, and need for additional procedures were discussed with the patient. The benefit of the surgery would be improvement in the patient's alignment. The alternatives to surgical management were covered with the patient and included continued monitoring, physical therapy, over-the-counter pain medications, ambulatory aids, injections, and activity modification. All the patient's questions were answered to her satisfaction. After this discussion, the patient expressed understanding and elected to proceed with surgical intervention.       ___________________________________________________________________________     History:   Patient is a 58 y.o. female who presents today for follow-up on her lumbar spine.  Patient continues to have significant back pain.  She feels that in the upper lumbar region.  She notes it on a daily basis.  The pain is severe in nature.  She is limited in what she can do.  She has issues with a lot of time in bed as a result of the pain.  She has no pain radiating into either lower extremity.     Treatments tried: tylenol , ibuprofen, hydrocodone      Physical Exam:   General: no acute distress, appears stated age Neurologic: alert, answering questions appropriately, following commands Respiratory: unlabored breathing the table under anesthesia.  Room air, symmetric chest rise Psychiatric: appropriate affect, normal cadence to speech     MSK (spine):   -Strength exam                                                   Left                  Right EHL                              5/5                  5/5 TA                                 5/5  5/5 GSC                             5/5                  5/5 Knee extension            5/5                  5/5 Hip flexion                    5/5                  5/5   -Sensory exam                           Sensation intact to light touch in L3-S1 nerve distributions of bilateral lower extremities   Imaging: XRs of the lumbar spine from 09/08/2023 were previously independently reviewed and interpreted, showing disc height loss and kyphosis above her prior L1-S1 fusion.  There are interbody devices at all former disc spaces from L1-S1.  Interbody's appear in appropriate position.  There is subtle lucency seen around the L1 screws. Patient's Glattes angle has changed from 3 degrees to 28 degrees over the course of a year.  Disc height loss at T12/L1.  No evidence of instability in flexion/extension views.   X-rays of the lumbar spine in  the supine position from 11/06/2023 were previously independently reviewed and interpreted, showing improved alignment when compared to her standing films.  Kyphosis above the fusion is decreased in this position.   DEXA scan from 10/28/2023 showed osteopenia   CT scan of the thoracic spine from 09/24/2023 was previously independently reviewed and interpreted, showing large bridging osteophytes over the anterior aspect of the thoracic spine.  No lucency seen around the upper lumbar instrumentation or interbody devices.  Vacuum disc phenomenon in the T12/L1 disc space.   Patient name: [name] Patient MRN: [id number] Date of visit: 12/18/23

## 2023-12-18 NOTE — Assessment & Plan Note (Addendum)
 Glucose seems relatively well controlled on current insulin  regimen, except when eating sweets. Of note, we discussed the risk of continuing her jardiance  given her recent UTI sepsis and how SGLT2s can increase risk of UTIs. Pt understands risks and benefits of the medication. She would like to continue taking for now, but plans to speak to her nephrologist to see what they think.  - Cont Lantus  40u daily - cont hiumalog 4u w/ meals TID - Cont jardiance  25mg  daily for now. Consider stopping if pt develops another UTI

## 2023-12-19 LAB — SEDIMENTATION RATE: Sed Rate: 9 mm/h (ref 0–30)

## 2023-12-19 LAB — C-REACTIVE PROTEIN: CRP: <3 mg/L (ref <8.0)

## 2023-12-22 ENCOUNTER — Encounter: Payer: Self-pay | Admitting: Family Medicine

## 2023-12-22 ENCOUNTER — Other Ambulatory Visit: Payer: Self-pay | Admitting: Family Medicine

## 2023-12-23 ENCOUNTER — Other Ambulatory Visit: Payer: Self-pay | Admitting: Urology

## 2023-12-23 ENCOUNTER — Telehealth (HOSPITAL_BASED_OUTPATIENT_CLINIC_OR_DEPARTMENT_OTHER): Payer: Self-pay | Admitting: Cardiology

## 2023-12-23 ENCOUNTER — Other Ambulatory Visit: Payer: Self-pay | Admitting: Family Medicine

## 2023-12-23 MED ORDER — BACLOFEN 10 MG PO TABS: 10.0000 mg | ORAL_TABLET | Freq: Three times a day (TID) | ORAL | 3 refills | Status: DC | PRN

## 2023-12-23 NOTE — Telephone Encounter (Signed)
   Pre-operative Risk Assessment    Patient Name: [name]  DOB: 05-Dec-1965 MRN: [id number]   Date of last office visit: 11/26/23 Date of next office visit: 02/13/24   Request for Surgical Clearance    Procedure:  Cystoscopy with botox   Date of Surgery:  Clearance 12/31/23                                Surgeon:  Dr. Lonni Han Surgeon's Group or Practice Name:  [facility]  Phone number:  [phone]  Fax number:  [phone]   Type of Clearance Requested:   - Medical    Type of Anesthesia:  General    Additional requests/questions:     SignedHerma KATHEE Blush   12/23/2023, 3:33 PM

## 2023-12-23 NOTE — Telephone Encounter (Signed)
 Patient calls nurse line regarding refill. She states that she is out of medication and needs refill ASAP so that pharmacy can deliver.   Advised that I would forward to Dr. Elicia.   Chiquita JAYSON English, RN

## 2023-12-24 ENCOUNTER — Other Ambulatory Visit: Payer: Self-pay

## 2023-12-24 ENCOUNTER — Encounter (HOSPITAL_COMMUNITY)
Admission: RE | Admit: 2023-12-24 | Discharge: 2023-12-24 | Disposition: A | Discharge status: Home / Self Care | Source: Ambulatory Visit | Attending: Urology | Admitting: Urology

## 2023-12-24 ENCOUNTER — Encounter (HOSPITAL_COMMUNITY): Payer: Self-pay

## 2023-12-24 ENCOUNTER — Telehealth: Payer: Self-pay

## 2023-12-24 VITALS — Ht 69.0 in | Wt 229.0 lb

## 2023-12-24 DIAGNOSIS — I10 Essential (primary) hypertension: Secondary | ICD-10-CM

## 2023-12-24 DIAGNOSIS — E1122 Type 2 diabetes mellitus with diabetic chronic kidney disease: Secondary | ICD-10-CM

## 2023-12-24 DIAGNOSIS — Z01818 Encounter for other preprocedural examination: Secondary | ICD-10-CM

## 2023-12-24 NOTE — Telephone Encounter (Signed)
 Fax received from Dr. Lonni Han with [facility] to perform a cystoscopy with botox  under general anesthesia on patient.  Patient needs surgery clearance. Surgery is 12/31/23. Patient was seen on 12/05/23. Office protocol is a risk assessment can be sent to surgeon if patient has been seen in 60 days or less.   Sending to Dr. Zaida for risk assessment or recommendations if patient needs to be seen in office prior to surgical procedure.

## 2023-12-24 NOTE — Telephone Encounter (Signed)
 Mary Zola SAILOR, MD to Me      12/24/23  3:23 PM Patient is low risk for surgery from a pulmonary standpoint and should proceed with planned procedure    Copy of this encounter was faxed to [facility].

## 2023-12-24 NOTE — Progress Notes (Signed)
Pt. Needs orders for upcoming surgery. ?

## 2023-12-24 NOTE — Progress Notes (Signed)
 For Anesthesia: PCP - Elicia Hamlet, MD  Cardiologist - Lonni Slain, MD   Bowel Prep reminder:N/A  Chest x-ray - 11/21/23 EKG - 11/21/23 Stress Test - 04/13/19 ECHO - 11/22/23 Cardiac Cath -  Pacemaker/ICD device last checked: Pacemaker orders received: Device Rep notified:  Spinal Cord Stimulator:N/A  Sleep Study - Yes CPAP - N/A  Fasting Blood Sugar - 120's Checks Blood Sugar __3___ times a day Date and result of last Hgb A1c-9.8: 11/22/23  Last dose of GLP1 agonist- N/A GLP1 instructions: Hold 7 days prior to schedule (Hold 24 hours-daily)   Last dose of SGLT-2 inhibitors- Jardiance : will be hold after: 12/27/23 SGLT-2 instructions: Hold 72 hours prior to surgery  Blood Thinner Instructions:N/A Last Dose: Time last taken:  Aspirin  Instructions:N/A Last Dose: Time last taken:  Activity level: Can go up a flight of stairs and activities of daily living without stopping and without chest pain and/or shortness of breath   Able to exercise without chest pain and/or shortness of breath  Anesthesia review: Hx: CHF,CKD 3,COPD,DIA,PSVT,OSA(NO CPAP).  Patient denies shortness of breath, fever, cough and chest pain at PAT appointment   Patient verbalized understanding of instructions that were reviewed over the telephone.

## 2023-12-25 LAB — NICOTINE SCREEN, URINE: Cotinine Ql Scrn, Ur: NEGATIVE ng/mL

## 2023-12-27 ENCOUNTER — Other Ambulatory Visit: Payer: Self-pay

## 2023-12-27 DIAGNOSIS — R911 Solitary pulmonary nodule: Secondary | ICD-10-CM

## 2023-12-29 ENCOUNTER — Other Ambulatory Visit: Payer: Self-pay

## 2023-12-29 ENCOUNTER — Ambulatory Visit (HOSPITAL_COMMUNITY): Payer: Self-pay | Admitting: Family

## 2023-12-29 ENCOUNTER — Ambulatory Visit (INDEPENDENT_AMBULATORY_CARE_PROVIDER_SITE_OTHER): Admitting: Podiatry

## 2023-12-29 ENCOUNTER — Encounter (HOSPITAL_COMMUNITY)
Admission: RE | Admit: 2023-12-29 | Discharge: 2023-12-29 | Disposition: A | Source: Ambulatory Visit | Attending: Urology

## 2023-12-29 ENCOUNTER — Encounter: Payer: Self-pay | Admitting: Podiatry

## 2023-12-29 VITALS — BP 128/80 | HR 112 | Ht 69.0 in | Wt 235.0 lb

## 2023-12-29 DIAGNOSIS — E1142 Type 2 diabetes mellitus with diabetic polyneuropathy: Secondary | ICD-10-CM

## 2023-12-29 DIAGNOSIS — M79675 Pain in left toe(s): Secondary | ICD-10-CM

## 2023-12-29 DIAGNOSIS — B351 Tinea unguium: Secondary | ICD-10-CM

## 2023-12-29 DIAGNOSIS — N1831 Chronic kidney disease, stage 3a: Secondary | ICD-10-CM | POA: Insufficient documentation

## 2023-12-29 DIAGNOSIS — F0630 Mood disorder due to known physiological condition, unspecified: Secondary | ICD-10-CM

## 2023-12-29 DIAGNOSIS — I10 Essential (primary) hypertension: Secondary | ICD-10-CM | POA: Diagnosis not present

## 2023-12-29 DIAGNOSIS — Z01812 Encounter for preprocedural laboratory examination: Secondary | ICD-10-CM | POA: Insufficient documentation

## 2023-12-29 DIAGNOSIS — E1122 Type 2 diabetes mellitus with diabetic chronic kidney disease: Secondary | ICD-10-CM | POA: Diagnosis not present

## 2023-12-29 DIAGNOSIS — F418 Other specified anxiety disorders: Secondary | ICD-10-CM

## 2023-12-29 DIAGNOSIS — Z794 Long term (current) use of insulin: Secondary | ICD-10-CM | POA: Insufficient documentation

## 2023-12-29 DIAGNOSIS — M79674 Pain in right toe(s): Secondary | ICD-10-CM

## 2023-12-29 DIAGNOSIS — Z01818 Encounter for other preprocedural examination: Secondary | ICD-10-CM

## 2023-12-29 LAB — BASIC METABOLIC PANEL WITH GFR
Anion gap: 11 (ref 5–15)
BUN: 25 mg/dL — ABNORMAL HIGH (ref 6–20)
CO2: 25 mmol/L (ref 22–32)
Calcium: 9.7 mg/dL (ref 8.9–10.3)
Chloride: 102 mmol/L (ref 98–111)
Creatinine, Ser: 0.94 mg/dL (ref 0.44–1.00)
GFR, Estimated: >60 mL/min (ref >60)
Glucose, Bld: 82 mg/dL (ref 70–99)
Potassium: 4.8 mmol/L (ref 3.5–5.1)
Sodium: 139 mmol/L (ref 135–145)

## 2023-12-29 LAB — CBC
HCT: 47.6 % — ABNORMAL HIGH (ref 36.0–46.0)
Hemoglobin: 16 g/dL — ABNORMAL HIGH (ref 12.0–15.0)
MCH: 29.3 pg (ref 26.0–34.0)
MCHC: 33.6 g/dL (ref 30.0–36.0)
MCV: 87 fL (ref 80.0–100.0)
Platelets: 344 K/uL (ref 150–400)
RBC: 5.47 MIL/uL — ABNORMAL HIGH (ref 3.87–5.11)
RDW: 14.5 % (ref 11.5–15.5)
WBC: 14.3 K/uL — ABNORMAL HIGH (ref 4.0–10.5)
nRBC: 0 % (ref 0.0–0.2)

## 2023-12-29 LAB — SURGICAL PCR SCREEN
MRSA, PCR: POSITIVE — AB
Staphylococcus aureus: POSITIVE — AB

## 2023-12-29 MED ORDER — OXCARBAZEPINE 150 MG PO TABS: 150.0000 mg | ORAL_TABLET | Freq: Every day | ORAL | 0 refills | Status: DC

## 2023-12-29 MED ORDER — HYDROXYZINE HCL 10 MG PO TABS: 10.0000 mg | ORAL_TABLET | Freq: Three times a day (TID) | ORAL | 0 refills | Status: DC | PRN

## 2023-12-29 NOTE — Progress Notes (Signed)
 Subjective:   Patient ID: [name], female   DOB: 58 y.o.   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

## 2023-12-29 NOTE — Progress Notes (Signed)
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 encouragement reassurance was provided  Collaboration of Care: Medication Management AEB initiated Tegretol and hydroxyzine  patient to continue Effexor  and BuSpar  as directed and Referral or follow-up with counselor/therapist AEB follow-up with therapy counseling services  Patient/Guardian was advised Release of Information must be obtained prior to any record release in order to collaborate their care with an outside provider. Patient/Guardian was advised if they have not already done so to contact the registration department to sign all necessary forms in order for us  to  release information regarding their care.   Consent: Patient/Guardian gives verbal consent for treatment and assignment of benefits for services provided during this visit. Patient/Guardian expressed understanding and agreed to proceed.   Staci LOISE Kerns, NP 12/1/20253:58 PM

## 2023-12-29 NOTE — Progress Notes (Signed)
 PCR: + MRSA/+ STAPH: Hx: MRSA.

## 2023-12-30 ENCOUNTER — Encounter: Payer: Self-pay | Admitting: Family Medicine

## 2023-12-30 ENCOUNTER — Other Ambulatory Visit: Payer: Self-pay

## 2023-12-30 ENCOUNTER — Other Ambulatory Visit: Payer: Self-pay | Admitting: Family Medicine

## 2023-12-30 ENCOUNTER — Ambulatory Visit: Admitting: Pharmacist

## 2023-12-30 DIAGNOSIS — E1142 Type 2 diabetes mellitus with diabetic polyneuropathy: Secondary | ICD-10-CM

## 2023-12-30 DIAGNOSIS — E1122 Type 2 diabetes mellitus with diabetic chronic kidney disease: Secondary | ICD-10-CM

## 2023-12-30 DIAGNOSIS — G894 Chronic pain syndrome: Secondary | ICD-10-CM

## 2023-12-30 DIAGNOSIS — F418 Other specified anxiety disorders: Secondary | ICD-10-CM

## 2023-12-30 DIAGNOSIS — M5416 Radiculopathy, lumbar region: Secondary | ICD-10-CM

## 2023-12-30 DIAGNOSIS — J4489 Other specified chronic obstructive pulmonary disease: Secondary | ICD-10-CM

## 2023-12-30 DIAGNOSIS — J302 Other seasonal allergic rhinitis: Secondary | ICD-10-CM

## 2023-12-30 DIAGNOSIS — I5032 Chronic diastolic (congestive) heart failure: Secondary | ICD-10-CM

## 2023-12-30 DIAGNOSIS — M48062 Spinal stenosis, lumbar region with neurogenic claudication: Secondary | ICD-10-CM

## 2023-12-30 DIAGNOSIS — M4804 Spinal stenosis, thoracic region: Secondary | ICD-10-CM

## 2023-12-30 DIAGNOSIS — M1611 Unilateral primary osteoarthritis, right hip: Secondary | ICD-10-CM

## 2023-12-30 MED ORDER — VENLAFAXINE HCL ER 75 MG PO CP24: 75.0000 mg | ORAL_CAPSULE | Freq: Every day | ORAL | 3 refills | Status: DC

## 2023-12-30 NOTE — Telephone Encounter (Signed)
   Patient Name: [name]  DOB: May 29, 1965 MRN: [id number]  Primary Cardiologist: Shelda Bruckner, MD  Chart reviewed as part of pre-operative protocol coverage.  Per Dr. Bruckner, and given past medical history and time since last visit, based on ACC/AHA guidelines, [name] is at acceptable risk for the planned procedure without further cardiovascular testing.   I will route this recommendation to the requesting party via Epic fax function and remove from pre-op pool.  Please call with questions.  Damien JAYSON Braver, NP 12/30/2023, 11:02 AM

## 2023-12-30 NOTE — Telephone Encounter (Signed)
 Called surgery scheduler Nena Sanders and left message waiting on recommendations from pt's cardiologist Dr. Shelda Bruckner. The preop APP Damien HERO, NP has sent notes to MD as well.   We understand the procedure is tomorrow 12/31/23. Our office received request 12/23/23, with the Thanksgiving holiday the office was closed 11/27 and 11/28. Once our team has the needed requirements for the clearance from Dr. Bruckner we will be sure to fax notes to your office.   We do apologize for the delay.

## 2023-12-30 NOTE — Telephone Encounter (Signed)
 Nena from [facility] is requesting a callback at [phone] ext [phone] regarding them needing an answer today on if pt has been cleared for procedure tomorrow or not. Please advise

## 2023-12-30 NOTE — Progress Notes (Signed)
 Anesthesia Chart Review   Case: [id number] Date/Time: 12/31/23 1235   Procedure: CYSTOSCOPY, WITH INJECTION OF BLADDER NECK OR BLADDER WALL - CYSTOSCOPY WITH BOTOX    Anesthesia type: General   Diagnosis: Mixed incontinence urge and stress (female)(female) [N39.46]   Pre-op diagnosis: URINARY INCONTINENCE   [facility]   Surgeons: Devere Lonni Righter, MD       DISCUSSION:58 y.o. former smoker with h/o HTN, OSA, COPD, CHF, DM II, CKD Stage III, s/p sleeve gastrectomy 2022, urinary incontinence scheduled for above procedure 12/31/2023 with Dr. Lonni Devere.   Follows with cardiology for history of HFpEF, paroxysmal SVT, HTN, HLD. Hospitalized 5/12 - 06/14/2022 for fall, acute diastolic heart failure discharged to rehab and then home after 1 week.  Echo 06/12/2022 LVEF-65%, no RWMA.  Previous echo noted to show diastolic dysfunction. Admitted 6/12-6/16/24 with elevated BNP greater than 1300, CXR with cardiomegaly.  Diuresed with IV Lasix .  Discharged on Lasix  80 mg twice daily.  Last seen in follow-up by Reche Finder, NP on 07/31/2022.  At that time spironolactone  12.5 mg daily was added to her regimen for management of HFpEF.  Diltiazem  was increased from 120 mg every day to twice daily.  She was continued on losartan  25 mg daily and Lasix  80 mg twice daily.  Subsequently admitted 8/11 through 09/13/2022 for AMS felt secondary to hypertensive emergency.  She had a seizure which required Ativan  and intubation for airway protection.  Ultimately this was also felt to be secondary to hypertensive emergency, neurology consulted and continuous long-term EEG showed no seizure activity, no need for AEDs.  Home blood pressure medications were resumed and clonidine  and carvedilol  were added with good control of blood pressure.  Recent admission 10/24-10/27/2025 with DKA, sepsis due to UTI. Discharged on long-acting insulin  and plus short acting insulin  with meal coverage.   Pt  last seen by cardiology 11/26/2023. Euvolemic at this visit. Asymptomatic.  Pt did experience an episode of a-fib with RVR, but this was in setting of sepsis.  No recurrence, per cardio note no anticoagulation needed. 3 month follow up recommended. Per Dr. Lonni pt is cleared for planned procedure.   Follows with pulmonology for history of OHS/OSA on BiPAP, moderate COPD on Trelegy (PFTs in 2022 with FVC 65% predicted, FEV1 57% predicted, DLCO 49% predicted), mixed restrictive and obstructive lung disease, right upper lobe pulmonary nodule, current smoker. Seen in followup by Dr. Zaida and it was noted that right upper lobe nodule had increased in size on most recent CT scan 09/26/2023.  Biopsy performed which was negative for cancer.  A PET scan has been ordered.  Last seen by pulmonology 12/05/2023, stable at this visit. COPD managed on Trelegy inhaler.   Per pulmonology note 12/24/2023, Patient is low risk for surgery from a pulmonary standpoint and should proceed with planned procedure  VS: Ht 5' 9 (1.753 m)   Wt 103.9 kg   LMP 01/29/1992   BMI 33.82 kg/m   PROVIDERS: Elicia Hamlet, MD is PCP   Lonni Slain, MD is Cardiologist  LABS: Labs reviewed: Acceptable for surgery. (all labs ordered are listed, but only abnormal results are displayed)  Labs Reviewed - No data to display   IMAGES:   EKG:   CV: Echo 11/22/2023  1. There is mid-cavity acceleration up to , patient unable to  augment with Valsalva. Suspect this is related to hyperdynamic LV  function. Left ventricular ejection fraction, by estimation, is 70 to 75%.  The left  ventricle has hyperdynamic  function. The left ventricle has no regional wall motion abnormalities.  There is mild concentric left ventricular hypertrophy. Left ventricular  diastolic parameters are indeterminate.   2. Right ventricular systolic function is normal. The right ventricular  size is mildly enlarged. Tricuspid  regurgitation signal is inadequate for  assessing PA pressure.   3. Left atrial size was moderately dilated.   4. Right atrial size was moderately dilated.   5. The mitral valve is grossly normal. No evidence of mitral valve  regurgitation.   6. The aortic valve was not well visualized. Aortic valve regurgitation  is not visualized.   7. The inferior vena cava is normal in size with greater than 50%  respiratory variability, suggesting right atrial pressure of 3 mmHg.   Myocardial Perfusion 04/13/2019 The left ventricular ejection fraction is mildly decreased (45-54%). Nuclear stress EF: 47%. There was no ST segment deviation noted during stress. This is an intermediate risk study due to reduced systolic function. There is no ischemia. New wall motion abnormalities noted. Consider echo if clinically warranted.   Past Medical History:  Diagnosis Date   Alcoholism in remission (HCC)    11-26-2021  pt stated no alcohol few yrs ago   Asthma    Bipolar disorder (HCC)    Cancer (HCC)    cervical   Chronic diastolic CHF (congestive heart failure) (HCC) 2014   followed by dr b. lonni;   Chronic hypoxemic respiratory failure [facility])    pulmonologist--- dr brenna;   (11-26-2021 per pt was on supplemental oxygen  until she 01/ 2022 did not need it,  does not check O2 sats at home anymore.   CKD (chronic kidney disease), stage III [facility])    nephrologist--- dr dolan   COPD (chronic obstructive pulmonary disease) (HCC)    11-26-2021 pt trying to quit smoking ,  started age 68   Depression    Diabetes mellitus type 2, diet-controlled (HCC)    followed by pcp   (11-26-2021  per pt last A1c by pcp on 10-30-2021 7.8,  stated does not check blood sugar)   Dyspnea    Fibrocystic breast disease    GAD (generalized anxiety disorder)    GERD (gastroesophageal reflux disease)    Hepatitis B surface antigen positive 11/2017   pt stated asymptomatic ,  no treatment   History of cervical dysplasia  04/10/2011   '93- once dx.-got pregnant-no intervention, then postpartum, no dysplasia found   History of condyloma acuminatum 04/09/2011   Hyperlipidemia    Hypertension    Incomplete left bundle branch block (LBBB)    Lung nodule 11/2017   pulmonologist--- dr brenna;  RUL and RLL   Muscle spasm of both lower legs    chronic , due to back issues,   OA (osteoarthritis) 04/10/2011   hips, shoulders, back   OSA (obstructive sleep apnea) 2009   study on epic 09-18-2007 mild osa w/ hypoxmia ;   (11-26-2021 per pt last used cpap 01/ 2022 before bariatric surgery 04/ 2022 stated lost wt / and stopped needing supplemental oxygen  , did not need anymore)   Pneumonia    Polyneuropathy    due to back issues and DM   Polysubstance abuse (HCC)    11-26-2021  pt hx cocaine use stated none for yrs but ED visit 08-14-2021  positive UDS,  pt stated it was once only for depression none since   PSVT (paroxysmal supraventricular tachycardia)    followed by cardiology   Spinal  stenosis, unspecified spinal region    Urge incontinence of urine    Wears dentures    upper    Past Surgical History:  Procedure Laterality Date   ANAL FISTULECTOMY  04/17/2011   Procedure: FISTULECTOMY ANAL;  Surgeon: Donnice POUR. Belinda, MD;  Location: [facility];  Service: General;  Laterality: N/A;  Excision of Condyloma Gluteal Cleft    ANAL RECTAL MANOMETRY N/A 03/26/2019   Procedure: ANO RECTAL MANOMETRY;  Surgeon: Eda Iha, MD;  [facility];  Service: Gastroenterology;  Laterality: N/A;   BIOPSY  03/09/2019   Procedure: BIOPSY;  Surgeon: Eda Iha, MD;  [facility];  Service: Gastroenterology;;   BOTOX  INJECTION N/A 12/29/2018   Procedure: BOTOX  INJECTION(100 UNITS)  WITH CYSTOSCOPY;  Surgeon: Devere Lonni Righter, MD;  [facility];  Service: Urology;  Laterality: N/A;   BOTOX  INJECTION N/A 07/27/2019   Procedure: BOTOX  INJECTION WITH CYSTOSCOPY;  Surgeon: Devere Lonni Righter, MD;  [facility];  Service: Urology;  Laterality: N/A;  ONLY NEEDS 30 MIN   BOTOX  INJECTION N/A 05/02/2021   Procedure: BOTOX  INJECTION WITH CYSTOSCOPY;  Surgeon: Devere Lonni Righter, MD;  Location: [facility];  Service: Urology;  Laterality: N/A;  ONLY NEEDS 30 MIN   BOTOX  INJECTION N/A 11/28/2021   Procedure: BOTOX  INJECTION WITH CYSTOSCOPY, 200 UNITS;  Surgeon: Devere Lonni Righter, MD;  Location[facility];  Service: Urology;  Laterality: N/A;  ONLY NEEDS 30 MIN   BOTOX  INJECTION N/A 05/15/2022   Procedure: BOTOX  INJECTION 200 UNITS;  Surgeon: Devere Lonni Righter, MD;  Location: [facility];  Service: Urology;  Laterality: N/A;   BREAST EXCISIONAL BIOPSY Right    x 3   BREAST EXCISIONAL BIOPSY Left    12-29-2000  and 11-30-2001 @[facility]  by dr curvin   BRONCHIAL BRUSHINGS  10/30/2023   Procedure: BRONCHOSCOPY, WITH BRUSH BIOPSY;  Surgeon: Zaida Zola SAILOR, MD;  Location: [facility];  Service: Pulmonary;;   BRONCHIAL NEEDLE ASPIRATION BIOPSY  10/30/2023   Procedure: BRONCHOSCOPY, WITH NEEDLE ASPIRATION BIOPSY;  Surgeon: Zaida Zola SAILOR, MD;  Location: [facility];  Service: Pulmonary;;   CARPAL TUNNEL RELEASE Bilateral    right 08-30-2002 and left 07-04-2003  by dr lucilla @ [facility]   CATARACT EXTRACTION W/ INTRAOCULAR LENS IMPLANT Bilateral 12/2020   CERVICAL FUSION  04/10/2011   x3- cervical fusion with plating and screws-Dr. Gust   COLONOSCOPY WITH PROPOFOL  N/A 07/06/2015   Procedure: COLONOSCOPY WITH PROPOFOL ;  Surgeon: Norleen Hint, MD;  [facility];  Service: Endoscopy;  Laterality: N/A;   COLONOSCOPY WITH PROPOFOL  N/A 03/09/2019   Procedure: COLONOSCOPY WITH PROPOFOL ;  Surgeon: Eda Iha, MD;  [facility];  Service: Gastroenterology;  Laterality: N/A;   CYSTOSCOPY N/A 05/15/2022   Procedure: CYSTOSCOPY;  Surgeon: Devere Lonni Righter, MD;  Location: [facility];  Service:  Urology;  Laterality: N/A;   FRACTURE SURGERY     little toe left foot   I & D EXTREMITY Left 12/18/2016   Procedure: IRRIGATION AND DEBRIDEMENT LEFT FOOT, CLOSURE;  Surgeon: Jerri Kay HERO, MD;  Location: [facility];  Service: Orthopedics;  Laterality: Left;   INCISION AND DRAINAGE ABSCESS Right 05/03/2002   @[facility]  by dr curvin;   right breast abscess   INJECTION KNEE Right 10/03/2020   Procedure: KNEE INJECTION;  Surgeon: Lucilla Lynwood BRAVO, MD;  Location: [facility];  Service: Orthopedics;  Laterality: Right;   KNEE ARTHROSCOPY WITH MEDIAL MENISECTOMY Right 03/21/2021   Procedure:  RIGHT KNEE ARTHROSCOPY WITH MEDIAL AND PARTIAL LATERAL MENISCECTOMY;  Surgeon: Jerri Kay HERO, MD;  Location: [facility];  Service: Orthopedics;  Laterality: Right;   LAPAROSCOPIC GASTRIC SLEEVE RESECTION N/A 05/08/2020   Procedure: LAPAROSCOPIC GASTRIC SLEEVE RESECTION;  Surgeon: Stevie Herlene Righter, MD;  [facility];  Service: General;  Laterality: N/A;   LUMBAR DISC SURGERY  04/10/2011   x5-Lumbar fusion-retained hardware.(Dr. Lucilla)   LUMBAR LAMINECTOMY N/A 10/03/2020   Procedure: THORACOLUMBAR LAMINECTOMIES THORACIC ELEVEN-TWELVE, THORACIC TWELVE-LUMBAR ONE  AND LUMBAT ONE-TWO;  Surgeon: Lucilla Lynwood BRAVO, MD;  Location[facility];  Service: Orthopedics;  Laterality: N/A;   LUMBAR PERCUTANEOUS PEDICLE SCREW 2 LEVEL  06/29/2021   Procedure: LUMBAR PERCUTANEOUS PEDICLE SCREW LUMBAR ONE-TWO;  Surgeon: Louis Shove, MD;  [facility];  Service: Neurosurgery;;   MULTIPLE TOOTH EXTRACTIONS     POLYPECTOMY  03/09/2019   Procedure: POLYPECTOMY;  Surgeon: Eda Iha, MD;  [facility];  Service: Gastroenterology;;   POSTERIOR LUMBAR FUSION  04/04/2005   @[facility]  by dr lucilla;   L3-- L5   POSTERIOR LUMBAR FUSION  03/04/2006   @[facility]  dr lucilla;   fusion L2--3 and redo L3--5   SHOULDER ARTHROSCOPY WITH ROTATOR CUFF REPAIR AND SUBACROMIAL DECOMPRESSION Left 05/07/2019   Procedure: LEFT SHOULDER ARTHROSCOPY WITH  DEBRIDEMENT, DISTAL CLAVICLE EXCISION,  SUBACROMIAL DECOMPRESSION AND POSSIBLE ROTATOR CUFF REPAIR;  Surgeon: Jerri Kay HERO, MD;  Location[facility];  Service: Orthopedics;  Laterality: Left;   TEE WITHOUT CARDIOVERSION N/A 04/06/2012   Procedure: TRANSESOPHAGEAL ECHOCARDIOGRAM (TEE);  Surgeon: Vinie KYM Maxcy, MD;  Location: [facility];  Service: Cardiovascular;  Laterality: N/A;   TRIGGER FINGER RELEASE Right    middle finger   TRIGGER FINGER RELEASE Left 06/28/2016   Procedure: RELEASE TRIGGER FINGER LEFT 3RD FINGER;  Surgeon: Jerri Kay HERO, MD;  [facility];  Service: Orthopedics;  Laterality: Left;   TRIGGER FINGER RELEASE Right 06/12/2016   Procedure: RIGHT INDEX FINGER TRIGGER RELEASE;  Surgeon: Jerri Kay HERO, MD;  [facility];  Service: Orthopedics;  Laterality: Right;   TRIGGER FINGER RELEASE Right 01/19/2018   Procedure: RIGHT RING FINGER TRIGGER FINGER RELEASE;  Surgeon: Jerri Kay HERO, MD;  [facility];  Service: Orthopedics;  Laterality: Right;   TRIGGER FINGER RELEASE Left 05/07/2019   Procedure: RELEASE TRIGGER FINGER LEFT INDEX FINGER;  Surgeon: Jerri Kay HERO, MD;  [facility];  Service: Orthopedics;  Laterality: Left;   UPPER GI ENDOSCOPY N/A 05/08/2020   Procedure: UPPER GI ENDOSCOPY;  Surgeon: Stevie, Herlene Righter, MD;  [facility];  Service: General;  Laterality: N/A;   VIDEO BRONCHOSCOPY WITH ENDOBRONCHIAL NAVIGATION N/A 10/30/2023   Procedure: VIDEO BRONCHOSCOPY WITH ENDOBRONCHIAL NAVIGATION;  Surgeon: Zaida Zola SAILOR, MD;  Location: [facility];  Service: Pulmonary;  Laterality: N/A;    MEDICATIONS:  acetaminophen  (TYLENOL ) 500 MG tablet   ammonium lactate  (LAC-HYDRIN ) 12 % lotion   atorvastatin  (LIPITOR) 40 MG tablet   baclofen  (LIORESAL ) 10 MG tablet   Blood Glucose Monitoring Suppl DEVI   busPIRone  (BUSPAR ) 10 MG tablet   carvedilol  (COREG ) 12.5 MG tablet   cloNIDine  (CATAPRES ) 0.2 MG tablet   DILT-XR 120 MG 24 hr capsule   empagliflozin   (JARDIANCE ) 25 MG TABS tablet   empagliflozin  (JARDIANCE ) 25 MG TABS tablet   fluticasone  (FLONASE ) 50 MCG/ACT nasal spray   furosemide  (LASIX ) 80 MG tablet   gabapentin  (NEURONTIN ) 300 MG capsule   Glucose Blood (BLOOD GLUCOSE TEST STRIPS) STRP   hydrOXYzine  (ATARAX )  10 MG tablet   insulin  glargine (LANTUS  SOLOSTAR) 100 UNIT/ML Solostar Pen   insulin  lispro (HUMALOG  KWIKPEN) 100 UNIT/ML KwikPen   Insulin  Pen Needle 31G X 6 MM MISC   ketoconazole  (NIZORAL ) 2 % cream   Lancet Device MISC   Lancets MISC   loratadine  (CLARITIN ) 10 MG tablet   losartan  (COZAAR ) 25 MG tablet   montelukast  (SINGULAIR ) 10 MG tablet   Multiple Vitamins-Minerals (BARIATRIC MULTIVITAMIN/IRON  PO)   mupirocin  ointment (BACTROBAN ) 2 %   omeprazole  (PRILOSEC) 20 MG capsule   OXcarbazepine (TRILEPTAL) 150 MG tablet   potassium chloride  SA (KLOR-CON  M) 20 MEQ tablet   sodium chloride  (OCEAN) 0.65 % SOLN nasal spray   spironolactone  (ALDACTONE ) 25 MG tablet   TRELEGY ELLIPTA  100-62.5-25 MCG/ACT AEPB   venlafaxine  XR (EFFEXOR -XR) 75 MG 24 hr capsule   No current facility-administered medications for this encounter.     Harlene Hoots Ward, PA-C [facility] [phone]

## 2023-12-30 NOTE — Anesthesia Preprocedure Evaluation (Signed)
 Anesthesia Evaluation  Patient identified by MRN, date of birth, ID band Patient awake    Reviewed: Allergy & Precautions, NPO status , Patient's Chart, lab work & pertinent test results  History of Anesthesia Complications Negative for: history of anesthetic complications  Airway Mallampati: II  TM Distance: >3 FB Neck ROM: Full    Dental  (+) Dental Advisory Given, Edentulous Upper, Edentulous Lower   Pulmonary neg pulmonary ROS, pneumonia, former smoker   Pulmonary exam normal        Cardiovascular Exercise Tolerance: Good hypertension, Pt. on medications and Pt. on home beta blockers + CAD, + Past MI, + CABG (2017), + Peripheral Vascular Disease and +CHF  Normal cardiovascular exam+ dysrhythmias Atrial Fibrillation    CV: Echo 03/18/2023 1. Akinesis of the inferior and inferosepal walls with overall mild to  moderate LV dysfunction.   2. Left ventricular ejection fraction, by estimation, is 40 to 45%. The  left ventricle has mild to moderately decreased function. The left  ventricle demonstrates regional wall motion abnormalities (see scoring  diagram/findings for description). The left   ventricular internal cavity size was mildly dilated. Left ventricular  diastolic parameters are consistent with Grade I diastolic dysfunction  (impaired relaxation). Elevated left atrial pressure.   3. Right ventricular systolic function is normal. The right ventricular  size is normal. Tricuspid regurgitation signal is inadequate for assessing  PA pressure.   4. Left atrial size was mildly dilated.   5. The mitral valve is normal in structure. Mild mitral valve  regurgitation. No evidence of mitral stenosis.   6. The aortic valve is tricuspid. Aortic valve regurgitation is mild.  Aortic valve sclerosis is present, with no evidence of aortic valve  stenosis.   7. The inferior vena cava is normal in size with greater than 50%   respiratory variability, suggesting right atrial pressure of 3 mmHg.     Neuro/Psych Seizures -,  PSYCHIATRIC DISORDERS  Depression    negative neurological ROS     GI/Hepatic Neg liver ROS,GERD  ,,  Endo/Other  diabetes, Type 2, Insulin  Dependent    Renal/GU CRFRenal disease  negative genitourinary   Musculoskeletal negative musculoskeletal ROS (+) Arthritis ,    Abdominal   Peds  Hematology Coumadin    Anesthesia Other Findings   Reproductive/Obstetrics                              Anesthesia Physical Anesthesia Plan  ASA: 3  Anesthesia Plan: General   Post-op Pain Management:    Induction: Intravenous  PONV Risk Score and Plan: 2 and Ondansetron , Dexamethasone , Midazolam  and Treatment may vary due to age or medical condition  Airway Management Planned: LMA  Additional Equipment: None  Intra-op Plan:   Post-operative Plan: Extubation in OR  Informed Consent: I have reviewed the patients History and Physical, chart, labs and discussed the procedure including the risks, benefits and alternatives for the proposed anesthesia with the patient or authorized representative who has indicated his/her understanding and acceptance.     Dental advisory given  Plan Discussed with:   Anesthesia Plan Comments: (See PAT note 12/24/2023   DISCUSSION:58 y.o. former smoker with h/o HTN, HTN, PAF on Warfarin, CAD s/p CABG and VSD repair 2017, CHF, DM II, gallstones scheduled for above procedure 12/30/2023 with Dr. Deward Foy.    He is status post drug-coated balloon angioplasty of the right SFA in 2017.  Most recently, he had left SFA

## 2023-12-30 NOTE — Telephone Encounter (Signed)
 [facility] called to see if pt has been cleared as procedure is tomorrow.   I read the notes from Damien Braver, NP at 11:02 AM she cleared the pt and faxed notes to [phone].   I will re-fax the notes again now. I confirmed the fax #.

## 2023-12-31 ENCOUNTER — Ambulatory Visit (HOSPITAL_COMMUNITY): Payer: Self-pay | Admitting: Medical

## 2023-12-31 ENCOUNTER — Ambulatory Visit (HOSPITAL_COMMUNITY)
Admission: RE | Admit: 2023-12-31 | Discharge: 2023-12-31 | Disposition: A | Source: Ambulatory Visit | Attending: Urology | Admitting: Urology

## 2023-12-31 ENCOUNTER — Encounter (HOSPITAL_COMMUNITY)
Admission: RE | Disposition: A | Discharge status: Home / Self Care | Payer: Self-pay | Source: Ambulatory Visit | Attending: Urology

## 2023-12-31 ENCOUNTER — Encounter (HOSPITAL_COMMUNITY): Payer: Self-pay | Admitting: Urology

## 2023-12-31 ENCOUNTER — Ambulatory Visit (HOSPITAL_COMMUNITY): Admitting: Anesthesiology

## 2023-12-31 DIAGNOSIS — Z87891 Personal history of nicotine dependence: Secondary | ICD-10-CM

## 2023-12-31 DIAGNOSIS — I5032 Chronic diastolic (congestive) heart failure: Secondary | ICD-10-CM

## 2023-12-31 DIAGNOSIS — I110 Hypertensive heart disease with heart failure: Secondary | ICD-10-CM

## 2023-12-31 DIAGNOSIS — N3946 Mixed incontinence: Secondary | ICD-10-CM | POA: Diagnosis not present

## 2023-12-31 HISTORY — PX: CYSTOSCOPY WITH INJECTION: SHX1424

## 2023-12-31 LAB — GLUCOSE, CAPILLARY
Glucose-Capillary: 94 mg/dL (ref 70–99)
Glucose-Capillary: 92 mg/dL (ref 70–99)

## 2023-12-31 SURGERY — CYSTOSCOPY, WITH INJECTION OF BLADDER NECK OR BLADDER WALL
Anesthesia: General

## 2023-12-31 MED ORDER — FENTANYL CITRATE (PF) 50 MCG/ML IJ SOSY: 25.0000 ug | PREFILLED_SYRINGE | INTRAMUSCULAR | Status: DC | PRN

## 2023-12-31 MED ORDER — ONDANSETRON HCL 4 MG/2ML IJ SOLN: 4.0000 mg | Freq: Once | INTRAMUSCULAR | Status: DC | PRN

## 2023-12-31 MED ORDER — ONABOTULINUMTOXINA 100 UNITS IJ SOLR
INTRAMUSCULAR | Status: AC
  Filled 2023-12-31: qty 200

## 2023-12-31 MED ORDER — FENTANYL CITRATE (PF) 100 MCG/2ML IJ SOLN
INTRAMUSCULAR | Status: AC
  Filled 2023-12-31: qty 2

## 2023-12-31 MED ORDER — SODIUM CHLORIDE (PF) 0.9 % IJ SOLN
INTRAMUSCULAR | Status: AC
  Filled 2023-12-31: qty 20

## 2023-12-31 MED ORDER — CEFAZOLIN SODIUM-DEXTROSE 2-4 GM/100ML-% IV SOLN
INTRAVENOUS | Status: AC
  Filled 2023-12-31: qty 100

## 2023-12-31 MED ORDER — CEFAZOLIN SODIUM-DEXTROSE 2-4 GM/100ML-% IV SOLN
2.0000 g | INTRAVENOUS | Status: AC
  Administered 2023-12-31: 2 g via INTRAVENOUS
  Filled 2023-12-31: qty 100

## 2023-12-31 MED ORDER — MIDAZOLAM HCL 5 MG/5ML IJ SOLN
INTRAMUSCULAR | Status: DC | PRN
  Administered 2023-12-31: 2 mg via INTRAVENOUS

## 2023-12-31 MED ORDER — LIDOCAINE HCL (PF) 2 % IJ SOLN
INTRAMUSCULAR | Status: AC
  Filled 2023-12-31: qty 5

## 2023-12-31 MED ORDER — INSULIN ASPART 100 UNIT/ML IJ SOLN: 0.0000 [IU] | INTRAMUSCULAR | Status: DC | PRN

## 2023-12-31 MED ORDER — SODIUM CHLORIDE (PF) 0.9 % IJ SOLN
INTRAMUSCULAR | Status: DC | PRN
  Administered 2023-12-31: 20 mL via INTRAVENOUS

## 2023-12-31 MED ORDER — PROPOFOL 10 MG/ML IV BOLUS
INTRAVENOUS | Status: DC | PRN
  Administered 2023-12-31: 140 mg via INTRAVENOUS

## 2023-12-31 MED ORDER — MIDAZOLAM HCL 2 MG/2ML IJ SOLN
INTRAMUSCULAR | Status: AC
  Filled 2023-12-31: qty 2

## 2023-12-31 MED ORDER — OXYCODONE HCL 5 MG PO TABS: 5.0000 mg | ORAL_TABLET | Freq: Once | ORAL | Status: DC | PRN

## 2023-12-31 MED ORDER — PHENYLEPHRINE HCL (PRESSORS) 10 MG/ML IV SOLN
INTRAVENOUS | Status: DC | PRN
  Administered 2023-12-31: 80 ug via INTRAVENOUS

## 2023-12-31 MED ORDER — FENTANYL CITRATE (PF) 100 MCG/2ML IJ SOLN
INTRAMUSCULAR | Status: DC | PRN
  Administered 2023-12-31: 50 ug via INTRAVENOUS
  Administered 2023-12-31: 25 ug via INTRAVENOUS

## 2023-12-31 MED ORDER — ONABOTULINUMTOXINA 100 UNITS IJ SOLR
INTRAMUSCULAR | Status: DC | PRN
  Administered 2023-12-31: 100 [IU] via INTRAMUSCULAR

## 2023-12-31 MED ORDER — OXYCODONE HCL 5 MG/5ML PO SOLN: 5.0000 mg | Freq: Once | ORAL | Status: DC | PRN

## 2023-12-31 MED ORDER — CHLORHEXIDINE GLUCONATE 0.12 % MT SOLN
15.0000 mL | Freq: Once | OROMUCOSAL | Status: AC
  Administered 2023-12-31: 15 mL via OROMUCOSAL

## 2023-12-31 MED ORDER — CEPHALEXIN 500 MG PO CAPS: 500.0000 mg | ORAL_CAPSULE | Freq: Two times a day (BID) | ORAL | 0 refills | Status: AC

## 2023-12-31 MED ORDER — LIDOCAINE HCL (CARDIAC) PF 100 MG/5ML IV SOSY
PREFILLED_SYRINGE | INTRAVENOUS | Status: DC | PRN
  Administered 2023-12-31: 100 mg via INTRATRACHEAL

## 2023-12-31 MED ORDER — ONDANSETRON HCL 4 MG/2ML IJ SOLN
INTRAMUSCULAR | Status: DC | PRN
  Administered 2023-12-31: 4 mg via INTRAVENOUS

## 2023-12-31 MED ORDER — PROPOFOL 10 MG/ML IV BOLUS
INTRAVENOUS | Status: AC
  Filled 2023-12-31: qty 20

## 2023-12-31 MED ORDER — ACETAMINOPHEN 10 MG/ML IV SOLN: 1000.0000 mg | Freq: Once | INTRAVENOUS | Status: DC | PRN

## 2023-12-31 MED ORDER — LACTATED RINGERS IV SOLN: INTRAVENOUS | Status: DC

## 2023-12-31 MED ORDER — ONDANSETRON HCL 4 MG/2ML IJ SOLN
INTRAMUSCULAR | Status: AC
  Filled 2023-12-31: qty 2

## 2023-12-31 MED ORDER — ORAL CARE MOUTH RINSE: 15.0000 mL | Freq: Once | OROMUCOSAL | Status: AC

## 2023-12-31 MED ORDER — DEXAMETHASONE SOD PHOSPHATE PF 10 MG/ML IJ SOLN
INTRAMUSCULAR | Status: DC | PRN
  Administered 2023-12-31: 4 mg via INTRAVENOUS

## 2023-12-31 SURGICAL SUPPLY — 13 items
BAG URO CATCHER STRL LF (MISCELLANEOUS) ×1 IMPLANT
CLOTH BEACON ORANGE TIMEOUT ST (SAFETY) ×1 IMPLANT
GLOVE SURG LX STRL 8.0 MICRO (GLOVE) ×1 IMPLANT
GOWN STRL REUS W/ TWL XL LVL3 (GOWN DISPOSABLE) ×2 IMPLANT
KIT TURNOVER KIT A (KITS) ×1 IMPLANT
MANIFOLD NEPTUNE II (INSTRUMENTS) ×1 IMPLANT
NDL ASPIRATION 22 (NEEDLE) ×1 IMPLANT
NDL SAFETY ECLIP 18X1.5 (MISCELLANEOUS) IMPLANT
PACK CYSTO (CUSTOM PROCEDURE TRAY) ×1 IMPLANT
PENCIL SMOKE EVACUATOR (MISCELLANEOUS) IMPLANT
SYR CONTROL 10ML LL (SYRINGE) IMPLANT
TUBING CONNECTING 10 (TUBING) IMPLANT
WATER STERILE IRR 3000ML UROMA (IV SOLUTION) ×1 IMPLANT

## 2023-12-31 NOTE — Anesthesia Procedure Notes (Signed)
 Procedure Name: Intubation Date/Time: 12/31/2023 1:19 PM  Performed by: Carleton Garnette SAUNDERS, CRNAPre-anesthesia Checklist: Patient identified, Emergency Drugs available, Suction available and Patient being monitored Patient Re-evaluated:Patient Re-evaluated prior to induction Oxygen  Delivery Method: Circle system utilized Preoxygenation: Pre-oxygenation with 100% oxygen  Induction Type: IV induction Ventilation: Mask ventilation without difficulty LMA: LMA inserted LMA Size: 4.0 Tube type: Oral Number of attempts: 1 Airway Equipment and Method: Stylet and Oral airway Placement Confirmation: ETT inserted through vocal cords under direct vision, positive ETCO2 and breath sounds checked- equal and bilateral Tube secured with: Tape Dental Injury: Teeth and Oropharynx as per pre-operative assessment

## 2023-12-31 NOTE — H&P (Signed)
 Urology Preoperative H&P   Chief Complaint: urge incontinence   History of Present Illness: [name] is a 58 y.o. female a history of cauda equina syndrome, neurogenic bladder, urge incontinence, and nocturnal enuresis who has undergone repeated intravesical Botox  injections. After Botox  treatments, she initially achieved marked improvement in incontinence and nocturnal enuresis, but reported recurrence of urgency and nocturia in early 2023, with some suspected UTIs. Most recently, following a higher-dose Botox  injection in April 2024, she experienced transient urinary retention requiring catheterization, but now reports significant improvement in leakage and urges, despite a high bladder residual on ultrasound three hours after voiding. No gross hematuria or persistent dysuria has been noted, and she continues under routine urologic surveillance. 12/10/2023: [name] presents today with complaints of urinary incontinence. This is a chronic issue for the patient who has a history of neurogenic bladder due to cauda equina syndrome. The patient previously had Botox  injections which worked very well for her, though she did have to catheterize for transient urinary retention following the procedure, and she would like to do this again. She urinates all the time without control and has to wear depends and a pad both at the same time which she changes every 1-2 hours all day and all night. Of note, the patient reports blood in her urine last December 2024 and also recently when she was admitted to the hospital with urinary tract infection. She does report 2 urinary tract infections including the one for which she was hospitalized recently since her last visit here. She was hospitalized for about 3 days in October for sepsis due to Klebsiella UTI. She was also restarted on insulin  at this time-Lantus  and Humalog . She was also found to have new onset atrial fibrillation with RVR for which she  is currently taking diltiazem  and Coreg . Eliquis  was held due to hematuria. She was to follow-up with us  on the hematuria and UTI. From cardiology notes it appears that they have decided not to start Eliquis  at this time. She does have heart failure and is taking several medications that contribute to her significant urination including furosemide  80 mg twice daily, empagliflozin  25 mg daily and spironolactone  12.5 mg daily. She was discharged on Bactrim  and reports completing the course. She has significant pyuria and microscopic hematuria in her urine today with few bacteria presen  12/31/2023: The patient is here today for repeat cystoscopy with intravesical Botox  injections to address her refractory urge incontinence.  She has done well since her last visit and denies interval UTIs, dysuria or hematuria.   Past Medical History:  Diagnosis Date   Alcoholism in remission (HCC)    11-26-2021  pt stated no alcohol few yrs ago   Asthma    Bipolar disorder (HCC)    Cancer (HCC)    cervical   Chronic diastolic CHF (congestive heart failure) (HCC) 2014   followed by dr b. lonni;   Chronic hypoxemic respiratory failure [facility])    pulmonologist--- dr brenna;   (11-26-2021 per pt was on supplemental oxygen  until she 01/ 2022 did not need it,  does not check O2 sats at home anymore.   CKD (chronic kidney disease), stage III [facility])    nephrologist--- dr dolan   COPD (chronic obstructive pulmonary disease) (HCC)    11-26-2021 pt trying to quit smoking ,  started age 49   Depression    Diabetes mellitus type 2, diet-controlled (HCC)    followed by pcp   (11-26-2021  per pt last A1c  by pcp on 10-30-2021 7.8,  stated does not check blood sugar)   Dyspnea    Fibrocystic breast disease    GAD (generalized anxiety disorder)    GERD (gastroesophageal reflux disease)    Hepatitis B surface antigen positive 11/2017   pt stated asymptomatic ,  no treatment   History of cervical dysplasia  04/10/2011   '93- once dx.-got pregnant-no intervention, then postpartum, no dysplasia found   History of condyloma acuminatum 04/09/2011   Hyperlipidemia    Hypertension    Incomplete left bundle branch block (LBBB)    Lung nodule 11/2017   pulmonologist--- dr brenna;  RUL and RLL   Muscle spasm of both lower legs    chronic , due to back issues,   OA (osteoarthritis) 04/10/2011   hips, shoulders, back   OSA (obstructive sleep apnea) 2009   study on epic 09-18-2007 mild osa w/ hypoxmia ;   (11-26-2021 per pt last used cpap 01/ 2022 before bariatric surgery 04/ 2022 stated lost wt / and stopped needing supplemental oxygen  , did not need anymore)   Pneumonia    Polyneuropathy    due to back issues and DM   Polysubstance abuse (HCC)    11-26-2021  pt hx cocaine use stated none for yrs but ED visit 08-14-2021  positive UDS,  pt stated it was once only for depression none since   PSVT (paroxysmal supraventricular tachycardia)    followed by cardiology   Spinal stenosis, unspecified spinal region    Urge incontinence of urine    Wears dentures    upper    Past Surgical History:  Procedure Laterality Date   ANAL FISTULECTOMY  04/17/2011   Procedure: FISTULECTOMY ANAL;  Surgeon: Donnice POUR. Belinda, MD;  Location: [facility];  Service: General;  Laterality: N/A;  Excision of Condyloma Gluteal Cleft    ANAL RECTAL MANOMETRY N/A 03/26/2019   Procedure: ANO RECTAL MANOMETRY;  Surgeon: Eda Iha, MD;  Location: [facility];  Service: Gastroenterology;  Laterality: N/A;   BIOPSY  03/09/2019   Procedure: BIOPSY;  Surgeon: Eda Iha, MD;  Location: [facility];  Service: Gastroenterology;;   BOTOX  INJECTION N/A 12/29/2018   Procedure: BOTOX  INJECTION(100 UNITS)  WITH CYSTOSCOPY;  Surgeon: Devere Lonni Righter, MD;  Location: [facility];  Service: Urology;  Laterality: N/A;   BOTOX  INJECTION N/A 07/27/2019   Procedure: BOTOX  INJECTION WITH CYSTOSCOPY;  Surgeon: Devere Lonni Righter, MD;  Location: [facility];  Service: Urology;  Laterality: N/A;  ONLY NEEDS 30 MIN   BOTOX  INJECTION N/A 05/02/2021   Procedure: BOTOX  INJECTION WITH CYSTOSCOPY;  Surgeon: Devere Lonni Righter, MD;  Location[facility];  Service: Urology;  Laterality: N/A;  ONLY NEEDS 30 MIN   BOTOX  INJECTION N/A 11/28/2021   Procedure: BOTOX  INJECTION WITH CYSTOSCOPY, 200 UNITS;  Surgeon: Devere Lonni Righter, MD;  Location: [facility];  Service: Urology;  Laterality: N/A;  ONLY NEEDS 30 MIN   BOTOX  INJECTION N/A 05/15/2022   Procedure: BOTOX  INJECTION 200 UNITS;  Surgeon: Devere Lonni Righter, MD;  Location: [facility];  Service: Urology;  Laterality: N/A;   BREAST EXCISIONAL BIOPSY Right    x 3   BREAST EXCISIONAL BIOPSY Left    12-29-2000  and 11-30-2001 @[facility]  by dr curvin   BRONCHIAL BRUSHINGS  10/30/2023   Procedure: BRONCHOSCOPY, WITH BRUSH BIOPSY;  Surgeon: Zaida Zola SAILOR, MD;  Location: [facility];  Service: Pulmonary;;   BRONCHIAL NEEDLE ASPIRATION BIOPSY  10/30/2023  Procedure: BRONCHOSCOPY, WITH NEEDLE ASPIRATION BIOPSY;  Surgeon: Zaida Zola SAILOR, MD;  Location: [facility];  Service: Pulmonary;;   CARPAL TUNNEL RELEASE Bilateral    right 08-30-2002 and left 07-04-2003  by dr lucilla @ [facility]   CATARACT EXTRACTION W/ INTRAOCULAR LENS IMPLANT Bilateral 12/2020   CERVICAL FUSION  04/10/2011   x3- cervical fusion with plating and screws-Dr. Gust   COLONOSCOPY WITH PROPOFOL  N/A 07/06/2015   Procedure: COLONOSCOPY WITH PROPOFOL ;  Surgeon: Norleen Hint, MD;  [facility]: [facility];  Service: Endoscopy;  Laterality: N/A;   COLONOSCOPY WITH PROPOFOL  N/A 03/09/2019   Procedure: COLONOSCOPY WITH PROPOFOL ;  Surgeon: Eda Iha, MD;  [facility]: [facility];  Service: Gastroenterology;  Laterality: N/A;   CYSTOSCOPY N/A 05/15/2022   Procedure: CYSTOSCOPY;  Surgeon: Devere Lonni Righter, MD;  Location: [facility];  Service:  Urology;  Laterality: N/A;   FRACTURE SURGERY     little toe left foot   I & D EXTREMITY Left 12/18/2016   Procedure: IRRIGATION AND DEBRIDEMENT LEFT FOOT, CLOSURE;  Surgeon: Jerri Kay HERO, MD;  Location: [facility];  Service: Orthopedics;  Laterality: Left;   INCISION AND DRAINAGE ABSCESS Right 05/03/2002   @[facility]  by dr curvin;   right breast abscess   INJECTION KNEE Right 10/03/2020   Procedure: KNEE INJECTION;  Surgeon: Lucilla Lynwood BRAVO, MD;  Location: [facility] OR;  Service: Orthopedics;  Laterality: Right;   KNEE ARTHROSCOPY WITH MEDIAL MENISECTOMY Right 03/21/2021   Procedure: RIGHT KNEE ARTHROSCOPY WITH MEDIAL AND PARTIAL LATERAL MENISCECTOMY;  Surgeon: Jerri Kay HERO, MD;  Location: [facility];  Service: Orthopedics;  Laterality: Right;   LAPAROSCOPIC GASTRIC SLEEVE RESECTION N/A 05/08/2020   Procedure: LAPAROSCOPIC GASTRIC SLEEVE RESECTION;  Surgeon: Stevie Herlene Righter, MD;  Location: [facility];  Service: General;  Laterality: N/A;   LUMBAR DISC SURGERY  04/10/2011   x5-Lumbar fusion-retained hardware.(Dr. Lucilla)   LUMBAR LAMINECTOMY N/A 10/03/2020   Procedure: THORACOLUMBAR LAMINECTOMIES THORACIC ELEVEN-TWELVE, THORACIC TWELVE-LUMBAR ONE  AND LUMBAT ONE-TWO;  Surgeon: Lucilla Lynwood BRAVO, MD;  Location: [facility];  Service: Orthopedics;  Laterality: N/A;   LUMBAR PERCUTANEOUS PEDICLE SCREW 2 LEVEL  06/29/2021   Procedure: LUMBAR PERCUTANEOUS PEDICLE SCREW LUMBAR ONE-TWO;  Surgeon: Louis Shove, MD;  Location: [facility];  Service: Neurosurgery;;   MULTIPLE TOOTH EXTRACTIONS     POLYPECTOMY  03/09/2019   Procedure: POLYPECTOMY;  Surgeon: Eda Iha, MD;  [facility];  Service: Gastroenterology;;   POSTERIOR LUMBAR FUSION  04/04/2005   @[facility]  by dr lucilla;   L3-- L5   POSTERIOR LUMBAR FUSION  03/04/2006   @[facility]  dr lucilla;   fusion L2--3 and redo L3--5   SHOULDER ARTHROSCOPY WITH ROTATOR CUFF REPAIR AND SUBACROMIAL DECOMPRESSION Left 05/07/2019   Procedure: LEFT SHOULDER ARTHROSCOPY WITH  DEBRIDEMENT, DISTAL CLAVICLE EXCISION,  SUBACROMIAL DECOMPRESSION AND POSSIBLE ROTATOR CUFF REPAIR;  Surgeon: Jerri Kay HERO, MD;  Location: [facility];  Service: Orthopedics;  Laterality: Left;   TEE WITHOUT CARDIOVERSION N/A 04/06/2012   Procedure: TRANSESOPHAGEAL ECHOCARDIOGRAM (TEE);  Surgeon: Vinie KYM Maxcy, MD;  Location: [facility];  Service: Cardiovascular;  Laterality: N/A;   TRIGGER FINGER RELEASE Right    middle finger   TRIGGER FINGER RELEASE Left 06/28/2016   Procedure: RELEASE TRIGGER FINGER LEFT 3RD FINGER;  Surgeon: Jerri Kay HERO, MD;  [facility];  Service: Orthopedics;  Laterality: Left;   TRIGGER FINGER RELEASE Right 06/12/2016   Procedure: RIGHT INDEX FINGER TRIGGER RELEASE;  Surgeon: Jerri Kay HERO, MD;  [facility];  Service: Orthopedics;  Laterality: Right;   TRIGGER FINGER RELEASE Right 01/19/2018   Procedure: RIGHT RING FINGER TRIGGER FINGER RELEASE;  Surgeon: Jerri Kay HERO, MD;  [facility];  Service: Orthopedics;  Laterality: Right;   TRIGGER FINGER RELEASE Left 05/07/2019   Procedure: RELEASE TRIGGER FINGER LEFT INDEX FINGER;  Surgeon: Jerri Kay HERO, MD;  [facility];  Service: Orthopedics;  Laterality: Left;   UPPER GI ENDOSCOPY N/A 05/08/2020   Procedure: UPPER GI ENDOSCOPY;  Surgeon: Stevie, Herlene Righter, MD;  Location: [facility];  Service: General;  Laterality: N/A;   VIDEO BRONCHOSCOPY WITH ENDOBRONCHIAL NAVIGATION N/A 10/30/2023   Procedure: VIDEO BRONCHOSCOPY WITH ENDOBRONCHIAL NAVIGATION;  Surgeon: Zaida Zola SAILOR, MD;  Location: [facility];  Service: Pulmonary;  Laterality: N/A;    Allergies:  Allergies  Allergen Reactions   Asa [Aspirin ] Other (See Comments)    Kidney disease   Methadose [Methadone Hcl] Other (See Comments)    Syncopal event   Corticosteroids Other (See Comments)    Hyperglycemia   Levaquin [Levofloxacin] Itching    Family History  Problem Relation Age of Onset   Pulmonary embolism Mother    Stroke Father    Alcohol  abuse Father    Pneumonia Father    Hypertension Father    Breast cancer Maternal Aunt    Hypertension Other    Diabetes Other        Aunts and cousins   Asthma Sister    Depression Sister    Arthritis Sister    Sickle cell anemia Son     Social History:  reports that she quit smoking about 5 weeks ago. Her smoking use included cigarettes. She started smoking about 46 years ago. She has a 23.4 pack-year smoking history. She has been exposed to tobacco smoke. She has never used smokeless tobacco. She reports that she does not currently use alcohol. She reports that she does not currently use drugs.  ROS: A complete review of systems was performed.  All systems are negative except for pertinent findings as noted.  Physical Exam:  Vital signs in last 24 hours:   Constitutional:  Alert and oriented, No acute distress Cardiovascular: Regular rate and rhythm, No JVD Respiratory: Normal respiratory effort, Lungs clear bilaterally GI: Abdomen is soft, nontender, nondistended, no abdominal masses GU: No CVA tenderness Lymphatic: No lymphadenopathy Neurologic: Grossly intact, no focal deficits Psychiatric: Normal mood and affect  Laboratory Data:  Recent Labs    12/29/23 1030  WBC 14.3*  HGB 16.0*  HCT 47.6*  PLT 344    Recent Labs    12/29/23 1030  NA 139  K 4.8  CL 102  GLUCOSE 82  BUN 25*  CALCIUM  9.7  CREATININE 0.94     No results found. However, due to the size of the patient record, not all encounters were searched. Please check Results Review for a complete set of results. Recent Results (from the past 240 hours)  Surgical pcr screen     Status: Abnormal   Collection Time: 12/29/23  9:53 AM   Specimen: Nasal Mucosa; Nasal Swab  Result Value Ref Range Status   MRSA, PCR POSITIVE (A) NEGATIVE Final    Comment: RESULT CALLED TO, READ BACK BY AND VERIFIED WITH:  NEAL, G 12/29/23 1439 AJ    Staphylococcus aureus POSITIVE (A) NEGATIVE Final    Comment:  (NOTE) The Xpert SA Assay (FDA approved for NASAL specimens in patients 78 years of age and older), is one component of a  comprehensive surveillance program. It is not intended to diagnose infection nor to guide or monitor treatment. Performed at [facility], [address], [place], [place] [address]     Renal Function: Recent Labs    12/29/23 1030  CREATININE 0.94   Estimated Creatinine Clearance: 82.8 mL/min (by C-G formula based on SCr of 0.94 mg/dL).  Radiologic Imaging: No results found.  I independently reviewed the above imaging studies.  Assessment and Plan [name] is a 58 y.o. female with refractory urge incontinence    Proceed with cysto w/ intravesical botox  injections (200 units). Risks discussed. We specifically discussed the risk of urinary retention and the need for an indwelling Foley or CIC. She voices understanding and wishes to proceed.   Lonni Han, MD 12/31/2023, 8:21 AM  [facility] Pager: [phone]

## 2023-12-31 NOTE — Op Note (Signed)
 Operative Note  Preoperative diagnosis:  1.  Refractory urge incontinence  Postoperative diagnosis: 1.  Refractory urge incontinence  Procedure(s): 1.  Cystoscopy with intravesical Botox  injections (200 units)  Surgeon: Lonni Han, MD  Assistants:  None  Anesthesia:  General  Complications:  None  EBL: Less than 5 mL  Specimens: 1.  None  Drains/Catheters: 1.  None  Intraoperative findings:   No intravesical or urethral abnormalities were seen  Indication:  [name] is a 58 y.o. female with refractory urge incontinence that has previously been well-managed with intravesical Botox  injections.  She is here today for repeat Botox  injections.  She has been consented for the above procedures, voices understanding and wishes to proceed.  Description of procedure:  After informed consent was obtained, the patient was brought to the operating room and general LMA anesthesia was administered. The patient was then placed in the dorsolithotomy position and prepped and draped in the usual sterile fashion. A timeout was performed. A 23 French rigid cystoscope was then inserted into the urethral meatus and advanced into the bladder under direct vision. A complete bladder survey revealed no intravesical pathology.   I then injected a total of 200 units of Botox  diluted in 20 mL of saline in 0.5 mL aliquots throughout the detrusor musculature in a grid like fashion.  There was no evidence of bleeding following the injections.  The patient's bladder was then partially drained.  She tolerated the procedure well and was transferred to the postanesthesia in stable condition.   Plan:  Discharge home

## 2023-12-31 NOTE — Transfer of Care (Signed)
 Immediate Anesthesia Transfer of Care Note  Patient: [name]  Procedure(s) Performed: CYSTOSCOPY, WITH INJECTION OF BLADDER NECK OR BLADDER WALL  Patient Location: PACU  Anesthesia Type:General  Level of Consciousness: sedated  Airway & Oxygen  Therapy: Patient Spontanous Breathing and Patient connected to face mask oxygen   Post-op Assessment: Report given to RN and Post -op Vital signs reviewed and stable  Post vital signs: Reviewed and stable  Last Vitals:  Vitals Value Taken Time  BP    Temp    Pulse    Resp    SpO2      Last Pain:  Vitals:   12/31/23 1140  TempSrc:   PainSc: 0-No pain         Complications: No notable events documented.

## 2024-01-01 ENCOUNTER — Encounter (HOSPITAL_COMMUNITY): Payer: Self-pay | Admitting: Urology

## 2024-01-01 NOTE — Anesthesia Postprocedure Evaluation (Signed)
 Anesthesia Post Note  Patient: [name]  Procedure(s) Performed: CYSTOSCOPY, WITH INJECTION OF BLADDER NECK OR BLADDER WALL     Patient location during evaluation: PACU Anesthesia Type: General Level of consciousness: awake and alert Pain management: pain level controlled Vital Signs Assessment: post-procedure vital signs reviewed and stable Respiratory status: spontaneous breathing, nonlabored ventilation, respiratory function stable and patient connected to nasal cannula oxygen  Cardiovascular status: blood pressure returned to baseline and stable Postop Assessment: no apparent nausea or vomiting Anesthetic complications: no   No notable events documented.  Last Vitals:  Vitals:   12/31/23 1402 12/31/23 1415  BP: 117/87 (!) 162/86  Pulse: 78 67  Resp: 16 15  Temp:  36.6 C  SpO2: 96% 93%    Last Pain:  Vitals:   12/31/23 1415  TempSrc:   PainSc: 0-No pain                 [name]

## 2024-01-06 ENCOUNTER — Encounter: Payer: Self-pay | Admitting: Orthopedic Surgery

## 2024-01-06 ENCOUNTER — Encounter: Payer: Self-pay | Admitting: Family Medicine

## 2024-01-06 ENCOUNTER — Ambulatory Visit: Admitting: Pharmacist

## 2024-01-07 ENCOUNTER — Other Ambulatory Visit (HOSPITAL_COMMUNITY): Payer: Self-pay | Admitting: *Deleted

## 2024-01-07 ENCOUNTER — Other Ambulatory Visit: Payer: Self-pay

## 2024-01-07 ENCOUNTER — Encounter: Payer: Self-pay | Admitting: Orthopaedic Surgery

## 2024-01-07 DIAGNOSIS — M1711 Unilateral primary osteoarthritis, right knee: Secondary | ICD-10-CM

## 2024-01-07 MED ORDER — HYDROXYZINE HCL 10 MG PO TABS: 10.0000 mg | ORAL_TABLET | Freq: Three times a day (TID) | ORAL | 0 refills | Status: DC | PRN

## 2024-01-07 NOTE — Telephone Encounter (Signed)
 yes

## 2024-01-08 NOTE — Telephone Encounter (Signed)
 Copy made for batch scanning.   Surgical clearance form faxed back to April at [facility].

## 2024-01-12 ENCOUNTER — Other Ambulatory Visit: Payer: Self-pay | Admitting: Family Medicine

## 2024-01-12 ENCOUNTER — Ambulatory Visit: Admitting: Pharmacist

## 2024-01-12 ENCOUNTER — Encounter: Payer: Self-pay | Admitting: Family Medicine

## 2024-01-12 DIAGNOSIS — J4489 Other specified chronic obstructive pulmonary disease: Secondary | ICD-10-CM

## 2024-01-13 ENCOUNTER — Telehealth: Payer: Self-pay

## 2024-01-13 NOTE — Progress Notes (Unsigned)
 Complex Care Management Note Care Guide Note  01/13/2024 Name: [name] MRN: [id number] DOB: [birth year]   Complex Care Management Outreach Attempts: An unsuccessful telephone outreach was attempted today to offer the patient information about available complex care management services.  Follow Up Plan:  Additional outreach attempts will be made to offer the patient complex care management information and services.   Encounter Outcome:  No Answer-Left voicemail  Leotis Rase [facility], [facility] Guide  Direct Dial : [phone]  Fax [phone]

## 2024-01-14 ENCOUNTER — Ambulatory Visit: Admitting: Orthopaedic Surgery

## 2024-01-14 ENCOUNTER — Ambulatory Visit (INDEPENDENT_AMBULATORY_CARE_PROVIDER_SITE_OTHER)

## 2024-01-14 DIAGNOSIS — M542 Cervicalgia: Secondary | ICD-10-CM

## 2024-01-14 DIAGNOSIS — G8929 Other chronic pain: Secondary | ICD-10-CM | POA: Diagnosis not present

## 2024-01-14 DIAGNOSIS — M25512 Pain in left shoulder: Secondary | ICD-10-CM | POA: Diagnosis not present

## 2024-01-14 DIAGNOSIS — M25511 Pain in right shoulder: Secondary | ICD-10-CM | POA: Diagnosis not present

## 2024-01-14 MED ORDER — METHYLPREDNISOLONE 4 MG PO TBPK: ORAL_TABLET | ORAL | 0 refills | Status: DC

## 2024-01-14 NOTE — Progress Notes (Signed)
 Office Visit Note   Patient: [name]           Date of Birth: [birth year]           MRN: [id number] Visit Date: 01/14/2024              Requested by: Elicia Hamlet, MD [address] [place],  [place] [address] PCP: Elicia Hamlet, MD   Assessment & Plan: Visit Diagnoses:  1. Neck pain   2. Chronic pain of both shoulders     Plan: History of Present Illness [name] is a 58 year old female with a history of neck surgeries who presents with neck pain radiating to both arms.  She has neck pain radiating down both arms and is in physical therapy for her neck and both shoulders. Her left shoulder pain has recently become significant.  She has had four prior neck surgeries. She is not taking any medications, including steroids, for this condition.  She uses a walker and has a spine surgery scheduled for the 20th that is intended to address her current symptoms.  Physical Exam NECK: Exam shows fully healed surgical scars.  No focal motor or sensory deficits in the upper extremities.  Assessment and Plan Cervicalgia with prior cervical fusion Chronic neck pain radiating to arms, worsened by previous cervical fusions. No hardware issues. Limited expertise in cervical spine management. - Prescribed steroid Dosepak for pain management.  She has done extensive physical therapy and has not felt much improvement. - Should she continue to have symptoms she should follow-up with Dr. Georgina.  She is scheduled for lumbar fusion with him later this month.  Bilateral shoulder pain - Secondary to cervicalgia  Follow-Up Instructions: Return if symptoms worsen or fail to improve.   Orders:  Orders Placed This Encounter  Procedures   XR Cervical Spine 2 or 3 views   Meds ordered this encounter  Medications   methylPREDNISolone  (MEDROL  DOSEPAK) 4 MG TBPK tablet    Sig: Take as directed    Dispense:  21 tablet    Refill:  0      Procedures: No procedures  performed   Clinical Data: No additional findings.   Subjective: Chief Complaint  Patient presents with   Neck - Pain    HPI  Review of Systems  Constitutional: Negative.   HENT: Negative.    Eyes: Negative.   Respiratory: Negative.    Cardiovascular: Negative.   Endocrine: Negative.   Musculoskeletal: Negative.   Neurological: Negative.   Hematological: Negative.   Psychiatric/Behavioral: Negative.    All other systems reviewed and are negative.    Objective: Vital Signs: LMP 01/29/1992   Physical Exam Vitals and nursing note reviewed.  Constitutional:      Appearance: She is well-developed.  HENT:     Head: Atraumatic.     Nose: Nose normal.  Eyes:     Extraocular Movements: Extraocular movements intact.  Cardiovascular:     Pulses: Normal pulses.  Pulmonary:     Effort: Pulmonary effort is normal.  Abdominal:     Palpations: Abdomen is soft.  Musculoskeletal:     Cervical back: Neck supple.  Skin:    General: Skin is warm.     Capillary Refill: Capillary refill takes less than 2 seconds.  Neurological:     Mental Status: She is alert. Mental status is at baseline.  Psychiatric:        Behavior: Behavior normal.  Thought Content: Thought content normal.        Judgment: Judgment normal.     Ortho Exam  Specialty Comments:  No specialty comments available.  Imaging: XR Cervical Spine 2 or 3 views Result Date: 01/14/2024 X-rays of the cervical spine show anterior and posterior fusion with intact hardware.  Diffuse osteopenia.  No acute abnormalities.    PMFS History: Patient Active Problem List   Diagnosis Date Noted   Chronic right shoulder pain 12/16/2023   Pressure injury of skin 11/22/2023   AKI (acute kidney injury) 11/22/2023   Chronic pain syndrome 01/15/2023   Headache 09/13/2022   Hypertension 09/12/2022   Chronic diastolic HF (heart failure) (HCC) 09/12/2022   Delirium 09/09/2022   Seizure (HCC) 09/09/2022    Malnutrition of moderate degree 09/09/2022   CHF (congestive heart failure) (HCC) 07/11/2022   Acute on chronic diastolic congestive heart failure (HCC) 07/10/2022   Acute on chronic heart failure with preserved ejection fraction (HFpEF) (HCC) 06/09/2022   Acute exacerbation of CHF (congestive heart failure) (HCC) 06/09/2022   Lumbar stenosis with neurogenic claudication 06/29/2021   Lumbar radiculopathy 06/29/2021   Biceps tendinopathy of right upper extremity 05/03/2021   Neuropathic pain of foot 03/28/2021   Impingement syndrome of right shoulder 03/08/2021   Arthritis of right acromioclavicular joint 03/08/2021   Acute medial meniscus tear of right knee 03/08/2021   Spinal stenosis of thoracic region with radiculopathy    Spinal stenosis of lumbar region with neurogenic claudication    Primary osteoarthritis of right knee    Status post lumbar laminectomy 10/03/2020   Cubital tunnel syndrome on left 05/02/2020   Prolapse of anterior vaginal wall 11/10/2019   Skin ulcer of left thigh, limited to breakdown of skin (HCC) 09/09/2019   Ulnar nerve compression 08/06/2019   Diabetic neuropathy associated with type 2 diabetes mellitus (HCC) 08/06/2019   Body mass index 50.0-59.9, adult (HCC) 06/12/2019   Impingement syndrome of left shoulder 05/07/2019   Polyp of colon    Osteoarthritis of right hip 02/12/2019   Nontraumatic tear of left supraspinatus tendon 12/30/2018   Gout 12/17/2018   Nontraumatic complete tear of right rotator cuff 12/08/2018   Tobacco abuse 08/21/2018   Right groin pain 07/20/2018   Paroxysmal SVT (supraventricular tachycardia) 03/20/2018   Pure hypercholesterolemia 03/20/2018   Muscle cramping 02/25/2018   Lung nodule 02/13/2018   Cervical post-laminectomy syndrome 11/13/2017   Essential hypertension 10/27/2017   Solitary pulmonary nodule 10/07/2017   Restrictive lung disease secondary to obesity 10/01/2017   Polycythemia, secondary 10/01/2017   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) 07/08/2017   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/27/2017   Chronic kidney disease (CKD), stage IV (severe) (HCC) 01/14/2017   Type 2 diabetes mellitus with stage 3 chronic kidney disease, with long-term current use of insulin  (HCC) 12/12/2016   Urge incontinence of urine 12/05/2016   Trigger finger, left index finger 07/15/2016   Back pain 04/07/2014   Morbid obesity (HCC) 10/02/2012   Chronic diastolic CHF (congestive heart failure) (HCC) 04/07/2012   Skin lesion of scalp 03/15/2011   GERD 01/26/2010   Obstructive sleep apnea treated with BiPAP 02/03/2008   FIBROCYSTIC BREAST DISEASE 10/28/2006   Hyperlipidemia 03/27/2006   Obesity hypoventilation syndrome (HCC) 03/27/2006   Depression with anxiety 03/27/2006   Past Medical History:  Diagnosis Date   Alcoholism in remission (HCC)    11-26-2021  pt stated no alcohol few yrs ago   Asthma  Bipolar disorder (HCC)    Cancer (HCC)    cervical   Chronic diastolic CHF (congestive heart failure) (HCC) 2014   followed by dr b. lonni;   Chronic hypoxemic respiratory failure [facility])    pulmonologist--- dr brenna;   (11-26-2021 per pt was on supplemental oxygen  until she 01/ 2022 did not need it,  does not check O2 sats at home anymore.   CKD (chronic kidney disease), stage III [facility])    nephrologist--- dr dolan   COPD (chronic obstructive pulmonary disease) (HCC)    11-26-2021 pt trying to quit smoking ,  started age 56   Depression    Diabetes mellitus type 2, diet-controlled (HCC)    followed by pcp   (11-26-2021  per pt last A1c by pcp on 10-30-2021 7.8,  stated does not check blood sugar)   Dyspnea    Fibrocystic breast disease    GAD (generalized anxiety disorder)    GERD (gastroesophageal reflux disease)    Hepatitis B surface antigen positive 11/2017   pt stated asymptomatic ,  no treatment   History of cervical dysplasia 04/10/2011    '93- once dx.-got pregnant-no intervention, then postpartum, no dysplasia found   History of condyloma acuminatum 04/09/2011   Hyperlipidemia    Hypertension    Incomplete left bundle branch block (LBBB)    Lung nodule 11/2017   pulmonologist--- dr brenna;  RUL and RLL   Muscle spasm of both lower legs    chronic , due to back issues,   OA (osteoarthritis) 04/10/2011   hips, shoulders, back   OSA (obstructive sleep apnea) 2009   study on epic 09-18-2007 mild osa w/ hypoxmia ;   (11-26-2021 per pt last used cpap 01/ 2022 before bariatric surgery 04/ 2022 stated lost wt / and stopped needing supplemental oxygen  , did not need anymore)   Pneumonia    Polyneuropathy    due to back issues and DM   Polysubstance abuse (HCC)    11-26-2021  pt hx cocaine use stated none for yrs but ED visit 08-14-2021  positive UDS,  pt stated it was once only for depression none since   PSVT (paroxysmal supraventricular tachycardia)    followed by cardiology   Spinal stenosis, unspecified spinal region    Urge incontinence of urine    Wears dentures    upper    Family History  Problem Relation Age of Onset   Pulmonary embolism Mother    Stroke Father    Alcohol abuse Father    Pneumonia Father    Hypertension Father    Breast cancer Maternal Aunt    Hypertension Other    Diabetes Other        Aunts and cousins   Asthma Sister    Depression Sister    Arthritis Sister    Sickle cell anemia Son     Past Surgical History:  Procedure Laterality Date   ANAL FISTULECTOMY  04/17/2011   Procedure: FISTULECTOMY ANAL;  Surgeon: Donnice POUR. Belinda, MD;  Location: [facility];  Service: General;  Laterality: N/A;  Excision of Condyloma Gluteal Cleft    ANAL RECTAL MANOMETRY N/A 03/26/2019   Procedure: ANO RECTAL MANOMETRY;  Surgeon: Eda Iha, MD;  Location: [facility];  Service: Gastroenterology;  Laterality: N/A;   BIOPSY  03/09/2019   Procedure: BIOPSY;  Surgeon: Eda Iha, MD;  Location: [facility];  Service: Gastroenterology;;   BOTOX  INJECTION N/A 12/29/2018   Procedure: BOTOX  INJECTION(100 UNITS)  WITH CYSTOSCOPY;  Surgeon:  Devere Lonni Righter, MD;  Location: [facility];  Service: Urology;  Laterality: N/A;   BOTOX  INJECTION N/A 07/27/2019   Procedure: BOTOX  INJECTION WITH CYSTOSCOPY;  Surgeon: Devere Lonni Righter, MD;  Location: [facility];  Service: Urology;  Laterality: N/A;  ONLY NEEDS 30 MIN   BOTOX  INJECTION N/A 05/02/2021   Procedure: BOTOX  INJECTION WITH CYSTOSCOPY;  Surgeon: Devere Lonni Righter, MD;  [facility];  Service: Urology;  Laterality: N/A;  ONLY NEEDS 30 MIN   BOTOX  INJECTION N/A 11/28/2021   Procedure: BOTOX  INJECTION WITH CYSTOSCOPY, 200 UNITS;  Surgeon: Devere Lonni Righter, MD;  Location: [facility];  Service: Urology;  Laterality: N/A;  ONLY NEEDS 30 MIN   BOTOX  INJECTION N/A 05/15/2022   Procedure: BOTOX  INJECTION 200 UNITS;  Surgeon: Devere Lonni Righter, MD;  Location: [facility];  Service: Urology;  Laterality: N/A;   BREAST EXCISIONAL BIOPSY Right    x 3   BREAST EXCISIONAL BIOPSY Left    12-29-2000  and 11-30-2001 @[facility]  by dr curvin   BRONCHIAL BRUSHINGS  10/30/2023   Procedure: BRONCHOSCOPY, WITH BRUSH BIOPSY;  Surgeon: Zaida Zola SAILOR, MD;  Location: [facility];  Service: Pulmonary;;   BRONCHIAL NEEDLE ASPIRATION BIOPSY  10/30/2023   Procedure: BRONCHOSCOPY, WITH NEEDLE ASPIRATION BIOPSY;  Surgeon: Zaida Zola SAILOR, MD;  Location: [facility];  Service: Pulmonary;;   CARPAL TUNNEL RELEASE Bilateral    right 08-30-2002 and left 07-04-2003  by dr lucilla @ [facility]   CATARACT EXTRACTION W/ INTRAOCULAR LENS IMPLANT Bilateral 12/2020   CERVICAL FUSION  04/10/2011   x3- cervical fusion with plating and screws-Dr. Gust   COLONOSCOPY WITH PROPOFOL  N/A 07/06/2015   Procedure: COLONOSCOPY WITH PROPOFOL ;  Surgeon: Norleen Hint, MD;  Location: [facility];  Service: Endoscopy;  Laterality:  N/A;   COLONOSCOPY WITH PROPOFOL  N/A 03/09/2019   Procedure: COLONOSCOPY WITH PROPOFOL ;  Surgeon: Eda Iha, MD;  Location: [facility];  Service: Gastroenterology;  Laterality: N/A;   CYSTOSCOPY N/A 05/15/2022   Procedure: CYSTOSCOPY;  Surgeon: Devere Lonni Righter, MD;  Location: [facility];  Service: Urology;  Laterality: N/A;   CYSTOSCOPY WITH INJECTION N/A 12/31/2023   Procedure: CYSTOSCOPY, WITH INJECTION OF BLADDER NECK OR BLADDER WALL;  Surgeon: Devere Lonni Righter, MD;  Location: [facility];  Service: Urology;  Laterality: N/A;  CYSTOSCOPY WITH BOTOX    FRACTURE SURGERY     little toe left foot   I & D EXTREMITY Left 12/18/2016   Procedure: IRRIGATION AND DEBRIDEMENT LEFT FOOT, CLOSURE;  Surgeon: Jerri Kay HERO, MD;  Location: [facility];  Service: Orthopedics;  Laterality: Left;   INCISION AND DRAINAGE ABSCESS Right 05/03/2002   @[facility]  by dr curvin;   right breast abscess   INJECTION KNEE Right 10/03/2020   Procedure: KNEE INJECTION;  Surgeon: Lucilla Lynwood BRAVO, MD;  Location: [facility];  Service: Orthopedics;  Laterality: Right;   KNEE ARTHROSCOPY WITH MEDIAL MENISECTOMY Right 03/21/2021   Procedure: RIGHT KNEE ARTHROSCOPY WITH MEDIAL AND PARTIAL LATERAL MENISCECTOMY;  Surgeon: Jerri Kay HERO, MD;  Location: [facility];  Service: Orthopedics;  Laterality: Right;   LAPAROSCOPIC GASTRIC SLEEVE RESECTION N/A 05/08/2020   Procedure: LAPAROSCOPIC GASTRIC SLEEVE RESECTION;  Surgeon: Stevie Herlene Righter, MD;  Location: [facility];  Service: General;  Laterality: N/A;   LUMBAR DISC SURGERY  04/10/2011   x5-Lumbar fusion-retained hardware.(Dr. Lucilla)   LUMBAR LAMINECTOMY N/A 10/03/2020   Procedure: THORACOLUMBAR LAMINECTOMIES THORACIC ELEVEN-TWELVE, THORACIC TWELVE-LUMBAR ONE  AND LUMBAT ONE-TWO;  Surgeon: Lucilla Lynwood BRAVO, MD;  Location: [facility];  Service: Orthopedics;  Laterality: N/A;   LUMBAR PERCUTANEOUS PEDICLE SCREW 2 LEVEL  06/29/2021   Procedure: LUMBAR  PERCUTANEOUS PEDICLE SCREW LUMBAR ONE-TWO;  Surgeon: Louis Shove, MD;  Location: [facility];  Service: Neurosurgery;;   MULTIPLE TOOTH EXTRACTIONS     POLYPECTOMY  03/09/2019   Procedure: POLYPECTOMY;  Surgeon: Eda Iha, MD;  [facility]: [facility];  Service: Gastroenterology;;   POSTERIOR LUMBAR FUSION  04/04/2005   @[facility]  by dr lucilla;   L3-- L5   POSTERIOR LUMBAR FUSION  03/04/2006   @[facility]  dr lucilla;   fusion L2--3 and redo L3--5   SHOULDER ARTHROSCOPY WITH ROTATOR CUFF REPAIR AND SUBACROMIAL DECOMPRESSION Left 05/07/2019   Procedure: LEFT SHOULDER ARTHROSCOPY WITH DEBRIDEMENT, DISTAL CLAVICLE EXCISION,  SUBACROMIAL DECOMPRESSION AND POSSIBLE ROTATOR CUFF REPAIR;  Surgeon: Jerri Kay HERO, MD;  Location: [facility];  Service: Orthopedics;  Laterality: Left;   TEE WITHOUT CARDIOVERSION N/A 04/06/2012   Procedure: TRANSESOPHAGEAL ECHOCARDIOGRAM (TEE);  Surgeon: Vinie KYM Maxcy, MD;  Location: [facility];  Service: Cardiovascular;  Laterality: N/A;   TRIGGER FINGER RELEASE Right    middle finger   TRIGGER FINGER RELEASE Left 06/28/2016   Procedure: RELEASE TRIGGER FINGER LEFT 3RD FINGER;  Surgeon: Jerri Kay HERO, MD;  Location: [facility];  Service: Orthopedics;  Laterality: Left;   TRIGGER FINGER RELEASE Right 06/12/2016   Procedure: RIGHT INDEX FINGER TRIGGER RELEASE;  Surgeon: Jerri Kay HERO, MD;  Location: [facility];  Service: Orthopedics;  Laterality: Right;   TRIGGER FINGER RELEASE Right 01/19/2018   Procedure: RIGHT RING FINGER TRIGGER FINGER RELEASE;  Surgeon: Jerri Kay HERO, MD;  Location: [facility];  Service: Orthopedics;  Laterality: Right;   TRIGGER FINGER RELEASE Left 05/07/2019   Procedure: RELEASE TRIGGER FINGER LEFT INDEX FINGER;  Surgeon: Jerri Kay HERO, MD;  Location: [facility];  Service: Orthopedics;  Laterality: Left;   UPPER GI ENDOSCOPY N/A 05/08/2020   Procedure: UPPER GI ENDOSCOPY;  Surgeon: Stevie, Herlene Righter, MD;  Location: [facility];  Service: General;  Laterality: N/A;   VIDEO BRONCHOSCOPY  WITH ENDOBRONCHIAL NAVIGATION N/A 10/30/2023   Procedure: VIDEO BRONCHOSCOPY WITH ENDOBRONCHIAL NAVIGATION;  Surgeon: Zaida Zola SAILOR, MD;  Location: [facility];  Service: Pulmonary;  Laterality: N/A;   Social History   Occupational History   Occupation: disabled    Comment: Disabled  Tobacco Use   Smoking status: Former    Current packs/day: 0.00    Average packs/day: 0.5 packs/day for 46.8 years (23.4 ttl pk-yrs)    Types: Cigarettes    Start date: 01/28/1977    Quit date: 11/21/2023    Years since quitting: 0.1    Passive exposure: Past   Smokeless tobacco: Never  Vaping Use   Vaping status: Never Used  Substance and Sexual Activity   Alcohol use: Not Currently    Comment: hx alcoholism,  (11-26-2021 per pt no alcohol in a while)   Drug use: Not Currently    Comment: 11-26-2021  (hx cocaine use) positive UDS in epic 08-14-2021  pt stated only did it once due to depression 07/ 2023 none since;  per pt last marijuana yrs ago   Sexual activity: Not Currently    Partners: Male    Birth control/protection: Post-menopausal

## 2024-01-15 NOTE — Progress Notes (Unsigned)
 Complex Care Management Note Care Guide Note  01/15/2024 Name: [name] MRN: [id number] DOB: Apr 21, 1965   Complex Care Management Outreach Attempts: A second unsuccessful outreach was attempted today to offer the patient with information about available complex care management services.  Follow Up Plan:  Additional outreach attempts will be made to offer the patient complex care management information and services.   Encounter Outcome:  No Answer-Left voicemail   Leotis Rase [facility], [facility] Guide  Direct Dial : [phone]  Fax [phone]

## 2024-01-16 ENCOUNTER — Encounter: Payer: Self-pay | Admitting: Pharmacist

## 2024-01-16 ENCOUNTER — Ambulatory Visit: Admitting: Pharmacist

## 2024-01-16 VITALS — BP 106/70 | HR 83 | Wt 243.0 lb

## 2024-01-16 DIAGNOSIS — Z87891 Personal history of nicotine dependence: Secondary | ICD-10-CM | POA: Diagnosis not present

## 2024-01-16 DIAGNOSIS — Z794 Long term (current) use of insulin: Secondary | ICD-10-CM

## 2024-01-16 DIAGNOSIS — E1122 Type 2 diabetes mellitus with diabetic chronic kidney disease: Secondary | ICD-10-CM

## 2024-01-16 DIAGNOSIS — N1831 Chronic kidney disease, stage 3a: Secondary | ICD-10-CM | POA: Diagnosis not present

## 2024-01-16 MED ORDER — SEMAGLUTIDE(0.25 OR 0.5MG/DOS) 2 MG/3ML ~~LOC~~ SOPN: 0.2500 mg | PEN_INJECTOR | SUBCUTANEOUS | 3 refills | Status: AC

## 2024-01-16 NOTE — Progress Notes (Signed)
 Complex Care Management Note Care Guide Note  01/16/2024 Name: [name] MRN: [id number] DOB: [birth year]   Complex Care Management Outreach Attempts: A third unsuccessful outreach was attempted today to offer the patient with information about available complex care management services.  Follow Up Plan:  No further outreach attempts will be made at this time. We have been unable to contact the patient to offer or enroll patient in complex care management services.  Encounter Outcome:  Patient Request to Call Back  Leotis Rase [facility], [facility] Guide  Direct Dial : [phone]  Fax [phone]

## 2024-01-16 NOTE — Patient Instructions (Signed)
 It was nice to see you today!  Congratulations on quitting for almost 50 days!  Your goal blood sugar is 80-130 before eating and less than 180 after eating.  Medication Changes: RE- START Ozempic  (semaglutide ) 0.25mg  once weekly.   No change to insulin  doses at this time.  Please call if you have symptoms of low glucose OR if you have multiple  readings less than 100  Continue all other medication the same.   Monitor blood sugars at home and bring your glucometer with you to your next visit.  Keep up the good work with diet and exercise. Aim for a diet full of vegetables, fruit and lean meats (chicken, turkey, fish). Try to limit salt intake by eating fresh or frozen vegetables (instead of canned), rinse canned vegetables prior to cooking and do not add any additional salt to meals.

## 2024-01-16 NOTE — Progress Notes (Signed)
 Reviewed and agree with Dr Rennis plan.

## 2024-01-16 NOTE — Assessment & Plan Note (Signed)
 Diabetes longstanding and currently with poor control due to inadequate medication regimen. Patient is able to verbalize appropriate hypoglycemia management plan. Medication adherence appears fair but was out of insulin  recently for multiple days. Patient interested in restarting GLP Tx.  Now OFF of SGLT-2 Therapy.  - Continued basal insulin  Lantus  (insulin  glargine) at 40 units daily in the morning.  - Continued rapid insulin  Humalog  (insulin  lispro) at 4 units prior to large meals.  - Restarted GLP-1 Ozempic  (semaglutide ) at 0.25mg  weekly  - Agree with discontinuation of SGLT2-I therapy due to repeat UTI -Patient educated on purpose, proper use, and potential adverse effects Ozempic  (semaglutide ).  -Extensively discussed pathophysiology of diabetes, recommended lifestyle interventions, dietary effects on blood sugar control.

## 2024-01-16 NOTE — Assessment & Plan Note (Signed)
 History of tobacco use disorder.  Reports complete abstinence for 49 days.  She is highly confident that she is never returning to smoking.  She reveals that she still thinks about smoking daily.  Congratulated success and encouraged continued abstinence.

## 2024-01-16 NOTE — Progress Notes (Signed)
 "   S:     Chief Complaint  [name] presents with   Medication Management    Diabetes and Tobacco Cessation Follow-up   58 y.o. female who presents for diabetes evaluation, education, and management. [name] arrives in good spirits and presents with assistance of rolling walker.   [name] was referred and last seen by Primary Care Provider, Dr. Elicia, on 12/16/2023.   PMH is significant for Obesity (bariatric surgery), Tobacco Use Disorder   Family/Social History: Stable - good at this time  Current diabetes medications include: Lantus  (insulin  glargine) 40 units daily, Humalog  (insulin  lispro)  Current hypertension medications include: carvedilol , clonidine , losartan , spironolactone  and furosemide  Current hyperlipidemia medications include: Atorvastatin   [name] reports adherence to taking all medications as prescribed.   Do you feel that your medications are working for you? Yes, but realizes that her glucose control is poor - desires to restart Ozempic  Have you been experiencing any side effects to the medications prescribed? no   [name] denies hypoglycemic events.  Reported home fasting blood sugars: 250-> 400 (when off insulin  for a few days).  While on insulin  range is 250-low 300s.   [name]-reported exercise habits: Limited by back pain  O:   Review of Systems  Respiratory:  Positive for shortness of breath.   Musculoskeletal:  Positive for back pain and neck pain.  Neurological:  Negative for dizziness.  All other systems reviewed and are negative.   Physical Exam Vitals reviewed.  Constitutional:      Appearance: Normal appearance.  Pulmonary:     Effort: Pulmonary effort is normal.  Neurological:     Mental Status: She is alert.  Psychiatric:        Mood and Affect: Mood normal.        Thought Content: Thought content normal.    Lab Results  Component Value Date   HGBA1C 9.8 (H) 11/22/2023   Vitals:   01/16/24 0936  BP: 106/70  Pulse: 83   SpO2: 94%    Lipid Panel     Component Value Date/Time   CHOL 159 09/09/2023 1551   TRIG 166 (H) 09/09/2023 1551   HDL 58 09/09/2023 1551   CHOLHDL 2.7 09/09/2023 1551   CHOLHDL 3.9 09/24/2018 2336   VLDL 33 09/24/2018 2336   LDLCALC 73 09/09/2023 1551   LDLDIRECT 114 (H) 09/15/2012 1559    Clinical Atherosclerotic Cardiovascular Disease (ASCVD): Yes  The 10-year ASCVD risk score (Arnett DK, et al., 2019) is: 7.3%   Values used to calculate the score:     Age: 29 years     Clinically relevant sex: Female     Is Non-Hispanic African American: Yes     Diabetic: Yes     Tobacco smoker: No     Systolic Blood Pressure: 106 mmHg     Is BP treated: Yes     HDL Cholesterol: 58 mg/dL     Total Cholesterol: 159 mg/dL   [name] is participating in a Managed Medicaid Plan:  Yes   A/P: Diabetes longstanding and currently with poor control due to inadequate medication regimen. [name] is able to verbalize appropriate hypoglycemia management plan. Medication adherence appears fair but was out of insulin  recently for multiple days. [name] interested in restarting GLP Tx.  Now OFF of SGLT-2 Therapy.  - Continued basal insulin  Lantus  (insulin  glargine) at 40 units daily in the morning.  - Continued rapid insulin  Humalog  (insulin  lispro) at 4 units prior to large meals.  - Restarted GLP-1 Ozempic  (semaglutide )  at 0.25mg  weekly  - Agree with discontinuation of SGLT2-I therapy due to repeat UTI -[name] educated on purpose, proper use, and potential adverse effects Ozempic  (semaglutide ).  -Extensively discussed pathophysiology of diabetes, recommended lifestyle interventions, dietary effects on blood sugar control.   History of tobacco use disorder.  Reports complete abstinence for 49 days.  She is highly confident that she is never returning to smoking.  She reveals that she still thinks about smoking daily.  Congratulated success and encouraged continued abstinence.  Written [name]  instructions provided. [name] verbalized understanding of treatment plan.   Total time in face to face counseling 27 minutes.    Follow-up:  Pharmacist PRN PCP clinic visit in 01/21/2024   "

## 2024-01-18 ENCOUNTER — Encounter: Payer: Self-pay | Admitting: Orthopedic Surgery

## 2024-01-21 ENCOUNTER — Telehealth: Payer: Self-pay

## 2024-01-21 ENCOUNTER — Ambulatory Visit: Admitting: Family Medicine

## 2024-01-21 NOTE — Telephone Encounter (Signed)
 [name] with [facility] LVM on nurse line in regards to Ozempic .   She reports she is in the home now and reports going over medications with patient.   She reports her system is flagging a schedule (2) interaction with albuterol , carvedilol  and potassium.   She reports she wants to make provider aware.   Will forward to PCP.

## 2024-01-22 ENCOUNTER — Encounter: Payer: Self-pay | Admitting: Orthopedic Surgery

## 2024-01-22 ENCOUNTER — Encounter: Payer: Self-pay | Admitting: Family Medicine

## 2024-01-27 ENCOUNTER — Encounter: Payer: Self-pay | Admitting: Family Medicine

## 2024-01-28 NOTE — Telephone Encounter (Signed)
 Completed forms found in RN box.   Sent to [facility]. Copy made and placed in batch scanning.  Patient notified via mychart.   Chiquita JAYSON English, RN

## 2024-02-02 ENCOUNTER — Ambulatory Visit (HOSPITAL_COMMUNITY): Payer: Self-pay | Admitting: Family

## 2024-02-03 ENCOUNTER — Ambulatory Visit: Admitting: Family Medicine

## 2024-02-03 ENCOUNTER — Encounter: Payer: Self-pay | Admitting: Family Medicine

## 2024-02-03 ENCOUNTER — Other Ambulatory Visit: Payer: Self-pay | Admitting: Family Medicine

## 2024-02-03 DIAGNOSIS — I5032 Chronic diastolic (congestive) heart failure: Secondary | ICD-10-CM

## 2024-02-03 DIAGNOSIS — J302 Other seasonal allergic rhinitis: Secondary | ICD-10-CM

## 2024-02-04 MED ORDER — POTASSIUM CHLORIDE CRYS ER 20 MEQ PO TBCR: 20.0000 meq | EXTENDED_RELEASE_TABLET | Freq: Every day | ORAL | 2 refills | Status: AC

## 2024-02-04 MED ORDER — DILTIAZEM HCL ER 120 MG PO CP24: 120.0000 mg | ORAL_CAPSULE | Freq: Two times a day (BID) | ORAL | 2 refills | Status: AC

## 2024-02-04 MED ORDER — GABAPENTIN 300 MG PO CAPS: 300.0000 mg | ORAL_CAPSULE | Freq: Three times a day (TID) | ORAL | 3 refills | Status: AC

## 2024-02-04 MED ORDER — ATORVASTATIN CALCIUM 40 MG PO TABS: 40.0000 mg | ORAL_TABLET | Freq: Every day | ORAL | 0 refills | Status: AC

## 2024-02-04 MED ORDER — BACLOFEN 10 MG PO TABS: 10.0000 mg | ORAL_TABLET | Freq: Three times a day (TID) | ORAL | 3 refills | Status: AC | PRN

## 2024-02-04 MED ORDER — FLUTICASONE PROPIONATE 50 MCG/ACT NA SUSP: 2.0000 | Freq: Every day | NASAL | 2 refills | Status: AC

## 2024-02-04 MED ORDER — SPIRONOLACTONE 25 MG PO TABS: 12.5000 mg | ORAL_TABLET | Freq: Every day | ORAL | 3 refills | Status: AC

## 2024-02-04 MED ORDER — HYDROXYZINE HCL 10 MG PO TABS: 10.0000 mg | ORAL_TABLET | Freq: Three times a day (TID) | ORAL | 0 refills | Status: AC | PRN

## 2024-02-05 ENCOUNTER — Ambulatory Visit: Admitting: Orthopaedic Surgery

## 2024-02-09 ENCOUNTER — Other Ambulatory Visit: Payer: Self-pay | Admitting: Family Medicine

## 2024-02-09 ENCOUNTER — Telehealth (HOSPITAL_COMMUNITY): Admitting: Family

## 2024-02-09 DIAGNOSIS — E1142 Type 2 diabetes mellitus with diabetic polyneuropathy: Secondary | ICD-10-CM

## 2024-02-09 DIAGNOSIS — F32A Depression, unspecified: Secondary | ICD-10-CM | POA: Diagnosis not present

## 2024-02-09 DIAGNOSIS — Z79899 Other long term (current) drug therapy: Secondary | ICD-10-CM

## 2024-02-09 DIAGNOSIS — F1123 Opioid dependence with withdrawal: Secondary | ICD-10-CM

## 2024-02-09 DIAGNOSIS — Z6841 Body Mass Index (BMI) 40.0 and over, adult: Secondary | ICD-10-CM

## 2024-02-09 DIAGNOSIS — N184 Chronic kidney disease, stage 4 (severe): Secondary | ICD-10-CM

## 2024-02-09 DIAGNOSIS — F419 Anxiety disorder, unspecified: Secondary | ICD-10-CM | POA: Diagnosis not present

## 2024-02-09 DIAGNOSIS — I5033 Acute on chronic diastolic (congestive) heart failure: Secondary | ICD-10-CM

## 2024-02-09 DIAGNOSIS — B181 Chronic viral hepatitis B without delta-agent: Secondary | ICD-10-CM

## 2024-02-09 DIAGNOSIS — F418 Other specified anxiety disorders: Secondary | ICD-10-CM

## 2024-02-09 DIAGNOSIS — J9611 Chronic respiratory failure with hypoxia: Secondary | ICD-10-CM

## 2024-02-09 DIAGNOSIS — L89313 Pressure ulcer of right buttock, stage 3: Secondary | ICD-10-CM

## 2024-02-09 DIAGNOSIS — G834 Cauda equina syndrome: Secondary | ICD-10-CM

## 2024-02-09 MED ORDER — VENLAFAXINE HCL ER 150 MG PO CP24: 150.0000 mg | ORAL_CAPSULE | Freq: Every day | ORAL | 2 refills | Status: AC

## 2024-02-09 MED ORDER — OXCARBAZEPINE 150 MG PO TABS: 150.0000 mg | ORAL_TABLET | Freq: Every day | ORAL | 1 refills | Status: AC

## 2024-02-09 NOTE — Progress Notes (Unsigned)
 [facility] MD/PA/NP OP Progress Note  02/09/2024 12:01 PM TENECIA Osborne  MRN:  [id number]  Chief Complaint: Medication management follow-up appointment  HPI: [name] 59 year old African-American female presents for medication management follow-up appointment.  Per previous assessment patient was initiated on mood stabilization medication with Tegretol 150 mg daily.  Reports she has been taking and tolerating medications well.  Currently prescribed Effexor  75 mg by primary care.  She continues to endorse worsening depressive symptoms.  She reports her depressions mainly attributed to decline in ADLs and chronic back pain.  Reports she is looking forward to back surgery on January 20.  She reports a standing height fall 1 week ago.   I am tired of not being able to do what I need to do for myself and I been isolated to this house for almost a year.  Reports using a rollator for ambulation assistance.  Reported she did contact EMS to help when she fell.  Discussed increasing Effexor  75 mg to 150 mg daily.  Continue current Tegretol 150 mg daily follow-up 2 month for medication management.  Consideration for therapy services.  Visit Diagnosis: No diagnosis found.  Past Psychiatric History: ***  Past Medical History:  Past Medical History:  Diagnosis Date   Alcoholism in remission (HCC)    11-26-2021  pt stated no alcohol few yrs ago   Asthma    Bipolar disorder (HCC)    Cancer (HCC)    cervical   Chronic diastolic CHF (congestive heart failure) (HCC) 2014   followed by dr b. lonni;   Chronic hypoxemic respiratory failure [facility])    pulmonologist--- dr brenna;   (11-26-2021 per pt was on supplemental oxygen  until she 01/ 2022 did not need it,  does not check O2 sats at home anymore.   CKD (chronic kidney disease), stage III [facility])    nephrologist--- dr dolan   COPD (chronic obstructive pulmonary disease) (HCC)    11-26-2021 pt trying to quit smoking ,  started age 36    Depression    Diabetes mellitus type 2, diet-controlled (HCC)    followed by pcp   (11-26-2021  per pt last A1c by pcp on 10-30-2021 7.8,  stated does not check blood sugar)   Dyspnea    Fibrocystic breast disease    GAD (generalized anxiety disorder)    GERD (gastroesophageal reflux disease)    Hepatitis B surface antigen positive 11/2017   pt stated asymptomatic ,  no treatment   History of cervical dysplasia 04/10/2011   '93- once dx.-got pregnant-no intervention, then postpartum, no dysplasia found   History of condyloma acuminatum 04/09/2011   Hyperlipidemia    Hypertension    Incomplete left bundle branch block (LBBB)    Lung nodule 11/2017   pulmonologist--- dr brenna;  RUL and RLL   Muscle spasm of both lower legs    chronic , due to back issues,   OA (osteoarthritis) 04/10/2011   hips, shoulders, back   OSA (obstructive sleep apnea) 2009   study on epic 09-18-2007 mild osa w/ hypoxmia ;   (11-26-2021 per pt last used cpap 01/ 2022 before bariatric surgery 04/ 2022 stated lost wt / and stopped needing supplemental oxygen  , did not need anymore)   Pneumonia    Polyneuropathy    due to back issues and DM   Polysubstance abuse (HCC)    11-26-2021  pt hx cocaine use stated none for yrs but ED visit 08-14-2021  positive UDS,  pt stated it was  once only for depression none since   PSVT (paroxysmal supraventricular tachycardia)    followed by cardiology   Spinal stenosis, unspecified spinal region    Urge incontinence of urine    Wears dentures    upper    Past Surgical History:  Procedure Laterality Date   ANAL FISTULECTOMY  04/17/2011   Procedure: FISTULECTOMY ANAL;  Surgeon: Donnice POUR. Belinda, MD;  [facility]: [facility];  Service: General;  Laterality: N/A;  Excision of Condyloma Gluteal Cleft    ANAL RECTAL MANOMETRY N/A 03/26/2019   Procedure: ANO RECTAL MANOMETRY;  Surgeon: Eda Iha, MD;  [facility];  Service: Gastroenterology;   Laterality: N/A;   BIOPSY  03/09/2019   Procedure: BIOPSY;  Surgeon: Eda Iha, MD;  [facility];  Service: Gastroenterology;;   BOTOX  INJECTION N/A 12/29/2018   Procedure: BOTOX  INJECTION(100 UNITS)  WITH CYSTOSCOPY;  Surgeon: Devere Lonni Righter, MD;  [facility];  Service: Urology;  Laterality: N/A;   BOTOX  INJECTION N/A 07/27/2019   Procedure: BOTOX  INJECTION WITH CYSTOSCOPY;  Surgeon: Devere Lonni Righter, MD;  [facility];  Service: Urology;  Laterality: N/A;  ONLY NEEDS 30 MIN   BOTOX  INJECTION N/A 05/02/2021   Procedure: BOTOX  INJECTION WITH CYSTOSCOPY;  Surgeon: Devere Lonni Righter, MD;  Location: [facility];  Service: Urology;  Laterality: N/A;  ONLY NEEDS 30 MIN   BOTOX  INJECTION N/A 11/28/2021   Procedure: BOTOX  INJECTION WITH CYSTOSCOPY, 200 UNITS;  Surgeon: Devere Lonni Righter, MD;  Location: [facility];  Service: Urology;  Laterality: N/A;  ONLY NEEDS 30 MIN   BOTOX  INJECTION N/A 05/15/2022   Procedure: BOTOX  INJECTION 200 UNITS;  Surgeon: Devere Lonni Righter, MD;  Location: [facility];  Service: Urology;  Laterality: N/A;   BREAST EXCISIONAL BIOPSY Right    x 3   BREAST EXCISIONAL BIOPSY Left    12-29-2000  and 11-30-2001 @[facility]  by dr curvin   BRONCHIAL BRUSHINGS  10/30/2023   Procedure: BRONCHOSCOPY, WITH BRUSH BIOPSY;  Surgeon: Zaida Zola SAILOR, MD;  [facility];  Service: Pulmonary;;   BRONCHIAL NEEDLE ASPIRATION BIOPSY  10/30/2023   Procedure: BRONCHOSCOPY, WITH NEEDLE ASPIRATION BIOPSY;  Surgeon: Zaida Zola SAILOR, MD;  [facility];  Service: Pulmonary;;   CARPAL TUNNEL RELEASE Bilateral    right 08-30-2002 and left 07-04-2003  by dr lucilla @ [facility]   CATARACT EXTRACTION W/ INTRAOCULAR LENS IMPLANT Bilateral 12/2020   CERVICAL FUSION  04/10/2011   x3- cervical fusion with plating and screws-Dr. Gust   COLONOSCOPY WITH PROPOFOL  N/A 07/06/2015    Procedure: COLONOSCOPY WITH PROPOFOL ;  Surgeon: Norleen Hint, MD;  [facility];  Service: Endoscopy;  Laterality: N/A;   COLONOSCOPY WITH PROPOFOL  N/A 03/09/2019   Procedure: COLONOSCOPY WITH PROPOFOL ;  Surgeon: Eda Iha, MD;  [facility];  Service: Gastroenterology;  Laterality: N/A;   CYSTOSCOPY N/A 05/15/2022   Procedure: CYSTOSCOPY;  Surgeon: Devere Lonni Righter, MD;  Location: [facility];  Service: Urology;  Laterality: N/A;   CYSTOSCOPY WITH INJECTION N/A 12/31/2023   Procedure: CYSTOSCOPY, WITH INJECTION OF BLADDER NECK OR BLADDER WALL;  Surgeon: Devere Lonni Righter, MD;  [facility];  Service: Urology;  Laterality: N/A;  CYSTOSCOPY WITH BOTOX    FRACTURE SURGERY     little toe left foot   I & D EXTREMITY Left 12/18/2016   Procedure: IRRIGATION AND DEBRIDEMENT LEFT FOOT, CLOSURE;  Surgeon: Jerri Kay HERO, MD;  [facility];  Service: Orthopedics;  Laterality: Left;   INCISION AND DRAINAGE ABSCESS Right 05/03/2002   @[facility]  by dr curvin;   right breast abscess   INJECTION KNEE Right 10/03/2020   Procedure: KNEE INJECTION;  Surgeon: Lucilla Lynwood BRAVO, MD;  [facility] OR;  Service: Orthopedics;  Laterality: Right;   KNEE ARTHROSCOPY WITH MEDIAL MENISECTOMY Right 03/21/2021   Procedure: RIGHT KNEE ARTHROSCOPY WITH MEDIAL AND PARTIAL LATERAL MENISCECTOMY;  Surgeon: Jerri Kay HERO, MD;  Location: [facility];  Service: Orthopedics;  Laterality: Right;   LAPAROSCOPIC GASTRIC SLEEVE RESECTION N/A 05/08/2020   Procedure: LAPAROSCOPIC GASTRIC SLEEVE RESECTION;  Surgeon: Stevie Herlene Righter, MD;  [facility];  Service: General;  Laterality: N/A;   LUMBAR DISC SURGERY  04/10/2011   x5-Lumbar fusion-retained hardware.(Dr. Lucilla)   LUMBAR LAMINECTOMY N/A 10/03/2020   Procedure: THORACOLUMBAR LAMINECTOMIES THORACIC ELEVEN-TWELVE, THORACIC TWELVE-LUMBAR ONE  AND LUMBAT ONE-TWO;  Surgeon: Lucilla Lynwood BRAVO, MD;  [facility];  Service: Orthopedics;  Laterality: N/A;   LUMBAR PERCUTANEOUS PEDICLE SCREW 2 LEVEL  06/29/2021   Procedure: LUMBAR PERCUTANEOUS PEDICLE SCREW LUMBAR ONE-TWO;  Surgeon: Louis Shove, MD;  [facility];  Service: Neurosurgery;;   MULTIPLE TOOTH EXTRACTIONS     POLYPECTOMY  03/09/2019   Procedure: POLYPECTOMY;  Surgeon: Eda Iha, MD;  [facility];  Service: Gastroenterology;;   POSTERIOR LUMBAR FUSION  04/04/2005   @[facility]  by dr lucilla;   L3-- L5   POSTERIOR LUMBAR FUSION  03/04/2006   @[facility]  dr lucilla;   fusion L2--3 and redo L3--5   SHOULDER ARTHROSCOPY WITH ROTATOR CUFF REPAIR AND SUBACROMIAL DECOMPRESSION Left 05/07/2019   Procedure: LEFT SHOULDER ARTHROSCOPY WITH DEBRIDEMENT, DISTAL CLAVICLE EXCISION,  SUBACROMIAL DECOMPRESSION AND POSSIBLE ROTATOR CUFF REPAIR;  Surgeon: Jerri Kay HERO, MD;  [facility];  Service: Orthopedics;  Laterality: Left;   TEE WITHOUT CARDIOVERSION N/A 04/06/2012   Procedure: TRANSESOPHAGEAL ECHOCARDIOGRAM (TEE);  Surgeon: Vinie KYM Maxcy, MD;  Location[facility]COPY;  Service: Cardiovascular;  Laterality: N/A;   TRIGGER FINGER RELEASE Right    middle finger   TRIGGER FINGER RELEASE Left 06/28/2016   Procedure: RELEASE TRIGGER FINGER LEFT 3RD FINGER;  Surgeon: Jerri Kay HERO, MD;  [facility];  Service: Orthopedics;  Laterality: Left;   TRIGGER FINGER RELEASE Right 06/12/2016   Procedure: RIGHT INDEX FINGER TRIGGER RELEASE;  Surgeon: Jerri Kay HERO, MD;  [facility];  Service: Orthopedics;  Laterality: Right;   TRIGGER FINGER RELEASE Right 01/19/2018   Procedure: RIGHT RING FINGER TRIGGER FINGER RELEASE;  Surgeon: Jerri Kay HERO, MD;  [facility];  Service: Orthopedics;  Laterality: Right;   TRIGGER FINGER RELEASE Left 05/07/2019   Procedure: RELEASE TRIGGER FINGER LEFT INDEX FINGER;  Surgeon: Jerri Kay HERO, MD;  [facility];  Service: Orthopedics;  Laterality: Left;   UPPER GI ENDOSCOPY N/A 05/08/2020    Procedure: UPPER GI ENDOSCOPY;  Surgeon: Stevie, Herlene Righter, MD;  [facility];  Service: General;  Laterality: N/A;   VIDEO BRONCHOSCOPY WITH ENDOBRONCHIAL NAVIGATION N/A 10/30/2023   Procedure: VIDEO BRONCHOSCOPY WITH ENDOBRONCHIAL NAVIGATION;  Surgeon: Zaida Zola SAILOR, MD;  [facility];  Service: Pulmonary;  Laterality: N/A;    Family Psychiatric History: ***  Family History:  Family History  Problem Relation Age of Onset   Pulmonary embolism Mother    Stroke Father    Alcohol abuse Father    Pneumonia Father    Hypertension Father    Breast cancer Maternal Aunt    Hypertension Other  Diabetes Other        Aunts and cousins   Asthma Sister    Depression Sister    Arthritis Sister    Sickle cell anemia Son     Social History:  Social History   Socioeconomic History   Marital status: Divorced    Spouse name: Not on file   Number of children: 1   Years of education: 12   Highest education level: 12th grade  Occupational History   Occupation: disabled    Comment: Disabled  Tobacco Use   Smoking status: Former    Current packs/day: 0.00    Average packs/day: 0.5 packs/day for 46.8 years (23.4 ttl pk-yrs)    Types: Cigarettes    Start date: 01/28/1977    Quit date: 11/21/2023    Years since quitting: 0.2    Passive exposure: Past   Smokeless tobacco: Never  Vaping Use   Vaping status: Never Used  Substance and Sexual Activity   Alcohol use: Not Currently    Comment: hx alcoholism,  (11-26-2021 per pt no alcohol in a while)   Drug use: Not Currently    Comment: 11-26-2021  (hx cocaine use) positive UDS in epic 08-14-2021  pt stated only did it once due to depression 07/ 2023 none since;  per pt last marijuana yrs ago   Sexual activity: Not Currently    Partners: Male    Birth control/protection: Post-menopausal  Other Topics Concern   Not on file  Social History Narrative   Current Social History       Who lives at  home: Patient lives alone in one level home; has 4 steps onto porch with a handrail. Has smoke alarms, no throw rugs. Has elevated toilet. Has shower bench in tub with grab rails.    Transportation: Patient has own vehicle and drives herself    Important Relationships Family and friends    Pets: None    Likes to eat varied diet. Seafood, fish, roasted turkey breast, vegetables and salads and fruits.   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    Current Stressors: None    Religious / Personal Beliefs: God Jesus    Other: I love everyone, I wake up with joy in my heart and go to sleep the same way. I'm a lovable person.                                                                                                   Social Drivers of Health   Tobacco Use: Medium Risk (01/16/2024)   Patient History    Smoking Tobacco Use: Former    Smokeless Tobacco Use: Never    Passive Exposure: Past  Physicist, Medical Strain: Low Risk (12/10/2023)   Overall Financial Resource Strain (CARDIA)    Difficulty of Paying Living Expenses: Not hard at all  Food Insecurity: No Food Insecurity (12/10/2023)   Epic    Worried About Radiation Protection Practitioner of Food in the  Last Year: Never true    Ran Out of Food in the Last Year: Never true  Transportation Needs: No Transportation Needs (12/10/2023)   Epic    Lack of Transportation (Medical): No    Lack of Transportation (Non-Medical): No  Physical Activity: Inactive (12/10/2023)   Exercise Vital Sign    Days of Exercise per Week: 0 days    Minutes of Exercise per Session: Not on file  Stress: No Stress Concern Present (12/10/2023)   [employer] of Occupational Health - Occupational Stress Questionnaire    Feeling of Stress: Only a little  Social Connections: Socially Isolated (12/10/2023)   Social Connection and Isolation Panel    Frequency of  Communication with Friends and Family: More than three times a week    Frequency of Social Gatherings with Friends and Family: Never    Attends Religious Services: Never    Database Administrator or Organizations: No    Attends Engineer, Structural: Not on file    Marital Status: Divorced  Depression (PHQ2-9): Medium Risk (12/29/2023)   Depression (PHQ2-9)    PHQ-2 Score: 9  Alcohol Screen: Low Risk (07/14/2023)   Alcohol Screen    Last Alcohol Screening Score (AUDIT): 1  Housing: Low Risk (12/10/2023)   Epic    Unable to Pay for Housing in the Last Year: No    Number of Times Moved in the Last Year: 0    Homeless in the Last Year: No  Utilities: Patient Unable To Answer (11/22/2023)   Epic    Threatened with loss of utilities: Patient unable to answer  Health Literacy: Adequate Health Literacy (04/28/2023)   B1300 Health Literacy    Frequency of need for help with medical instructions: Never    Allergies: Allergies[1]  Metabolic Disorder Labs: Lab Results  Component Value Date   HGBA1C 9.8 (H) 11/22/2023   MPG 234.56 11/22/2023   MPG 123 09/09/2022   Lab Results  Component Value Date   PROLACTIN 8.4 10/28/2006   Lab Results  Component Value Date   CHOL 159 09/09/2023   TRIG 166 (H) 09/09/2023   HDL 58 09/09/2023   CHOLHDL 2.7 09/09/2023   VLDL 33 09/24/2018   LDLCALC 73 09/09/2023   LDLCALC 73 06/28/2020   Lab Results  Component Value Date   TSH <0.100 (L) 11/21/2023   TSH 0.274 (L) 09/09/2022    Therapeutic Level Labs: No results found for: LITHIUM No results found for: VALPROATE No results found for: CBMZ  Current Medications: Current Outpatient Medications  Medication Sig Dispense Refill   acetaminophen  (TYLENOL ) 500 MG tablet Take 1,000 mg by mouth 2 (two) times daily.     ammonium lactate  (LAC-HYDRIN ) 12 % lotion For dry skin 400 g 0   atorvastatin  (LIPITOR) 40 MG tablet Take 1 tablet (40 mg total) by mouth daily. 180  tablet 0   baclofen  (LIORESAL ) 10 MG tablet Take 1 tablet (10 mg total) by mouth 3 (three) times daily as needed for muscle spasms. 90 tablet 3   Blood Glucose Monitoring Suppl DEVI 1 each by Does not apply route as directed. Dispense based on patient and insurance preference. Use up to four times daily as directed. (FOR ICD-10 E10.9, E11.9). 1 each 0   busPIRone  (BUSPAR ) 10 MG tablet Take 1 tablet (10 mg total) by mouth 3 (three) times daily. 540 tablet 0   carvedilol  (COREG ) 12.5 MG tablet TAKE ONE TABLET (12.5MG  TOTAL) BY MOUTH TWICE DAILY 60 tablet 2  cloNIDine  (CATAPRES ) 0.2 MG tablet Take 1 tablet (0.2 mg total) by mouth 2 (two) times daily. 60 tablet 11   diltiazem  (DILT-XR) 120 MG 24 hr capsule Take 1 capsule (120 mg total) by mouth 2 (two) times daily. 60 capsule 2   fluticasone  (FLONASE ) 50 MCG/ACT nasal spray Place 2 sprays into both nostrils daily. 16 mL 2   furosemide  (LASIX ) 80 MG tablet TAKE ONE TABLET BY MOUTH TWICE DAILY relaTED TO SYSTEMIC (congestive heart failure) 60 tablet 3   gabapentin  (NEURONTIN ) 300 MG capsule Take 1 capsule (300 mg total) by mouth 3 (three) times daily. 90 capsule 3   Glucose Blood (BLOOD GLUCOSE TEST STRIPS) STRP 1 each by Does not apply route as directed. Dispense based on patient and insurance preference. Use up to four times daily as directed. (FOR ICD-10 E10.9, E11.9). 100 strip 0   hydrOXYzine  (ATARAX ) 10 MG tablet Take 1 tablet (10 mg total) by mouth 3 (three) times daily as needed. 90 tablet 0   insulin  glargine (LANTUS  SOLOSTAR) 100 UNIT/ML Solostar Pen Inject 40 Units into the skin daily. 15 mL 11   insulin  lispro (HUMALOG  KWIKPEN) 100 UNIT/ML KwikPen Inject 4 Units into the skin 3 (three) times daily. 15 mL 11   Insulin  Pen Needle 31G X 6 MM MISC 1 Device by Does not apply route 2 (two) times daily. 60 each 3   ketoconazole  (NIZORAL ) 2 % cream APPLY TO THE AFFECTED AREA(S) EVERY DAY 15 g 0   Lancet Device MISC 1 each by Does not  apply route as directed. Dispense based on patient and insurance preference. Use up to four times daily as directed. (FOR ICD-10 E10.9, E11.9). 1 each 0   Lancets MISC 1 each by Does not apply route as directed. Dispense based on patient and insurance preference. Use up to four times daily as directed. (FOR ICD-10 E10.9, E11.9). 100 each 0   loratadine  (CLARITIN ) 10 MG tablet TAKE ONE TABLET BY MOUTH EVERY DAY FOR allergies 30 tablet 0   losartan  (COZAAR ) 25 MG tablet Take 25 mg by mouth daily.     methocarbamol  (ROBAXIN ) 500 MG tablet Take 500 mg by mouth 4 (four) times daily.     methylPREDNISolone  (MEDROL  DOSEPAK) 4 MG TBPK tablet Take as directed 21 tablet 0   montelukast  (SINGULAIR ) 10 MG tablet TAKE ONE TABLET BY MOUTH AT BEDTIME 90 tablet 2   Multiple Vitamins-Minerals (BARIATRIC MULTIVITAMIN/IRON  PO) Take 1 tablet by mouth daily.     mupirocin  ointment (BACTROBAN ) 2 % Apply 1 Application topically 2 (two) times daily. To right elbow 22 g 1   nystatin  (MYCOSTATIN ) 100000 UNIT/ML suspension take 5ml (500,000 units total) BY MOUTH FOUR TIMES DAILY 60 mL 3   omeprazole  (PRILOSEC) 20 MG capsule Take 1 capsule (20 mg total) by mouth daily. 90 capsule 3   OXcarbazepine  (TRILEPTAL ) 150 MG tablet Take 1 tablet (150 mg total) by mouth daily. 30 tablet 0   potassium chloride  SA (KLOR-CON  M) 20 MEQ tablet Take 1 tablet (20 mEq total) by mouth daily. 30 tablet 2   Semaglutide ,0.25 or 0.5MG /DOS, 2 MG/3ML SOPN Inject 0.25 mg into the skin once a week. 3 mL 3   spironolactone  (ALDACTONE ) 25 MG tablet Take 0.5 tablets (12.5 mg total) by mouth daily. 15 tablet 3   TRELEGY ELLIPTA  100-62.5-25 MCG/ACT AEPB INHALE ONE PUFF into THE lungs EVERY DAY related TO acute AND chronic respiratory failure 60 each 3   venlafaxine  XR (EFFEXOR -XR) 75 MG 24 hr capsule  Take 1 capsule (75 mg total) by mouth daily with breakfast. 90 capsule 3   No current facility-administered medications for this visit.      Musculoskeletal: Strength & Muscle Tone: {desc; muscle tone:32375} Gait & Station: {PE GAIT ED WJUO:77474} Patient leans: {Patient Leans:21022755}  Psychiatric Specialty Exam: Review of Systems  Last menstrual period 01/29/1992.There is no height or weight on file to calculate BMI.  General Appearance: {Appearance:22683}  Eye Contact:  {BHH EYE CONTACT:22684}  Speech:  {Speech:22685}  Volume:  {Volume (PAA):22686}  Mood:  {BHH MOOD:22306}  Affect:  {Affect (PAA):22687}  Thought Process:  {Thought Process (PAA):22688}  Orientation:  {BHH ORIENTATION (PAA):22689}  Thought Content: {Thought Content:22690}   Suicidal Thoughts:  {ST/HT (PAA):22692}  Homicidal Thoughts:  {ST/HT (PAA):22692}  Memory:  {BHH MEMORY:22881}  Judgement:  {Judgement (PAA):22694}  Insight:  {Insight (PAA):22695}  Psychomotor Activity:  {Psychomotor (PAA):22696}  Concentration:  {Concentration:21399}  Recall:  {BHH GOOD/FAIR/POOR:22877}  Fund of Knowledge: {BHH GOOD/FAIR/POOR:22877}  Language: {BHH GOOD/FAIR/POOR:22877}  Akathisia:  {BHH YES OR NO:22294}  Handed:  {Handed:22697}  AIMS (if indicated): {Desc; done/not:10129}  Assets:  {Assets (PAA):22698}  ADL's:  {BHH JIO'D:77709}  Cognition: {chl bhh cognition:304700322}  Sleep:  {BHH GOOD/FAIR/POOR:22877}   Screenings: GAD-7    Flowsheet Row Patient Outreach Telephone from 06/02/2023 in [place] POPULATION HEALTH DEPARTMENT  Total GAD-7 Score 6   Mini-Mental    Flowsheet Row Clinical Support from 02/03/2018 in [facility] - [facility] Of [place]. [facility]  Total Score (max 30 points ) 30   PHQ2-9    Flowsheet Row Office Visit from 12/29/2023 in [facility] Office Visit from 10/29/2023 in [facility] - [facility] Dept Of [place]. [facility] Office Visit from 10/15/2023 in [facility] - A Dept Of [place]. [facility] Patient Outreach  Telephone from 06/02/2023 in [place] POPULATION HEALTH DEPARTMENT Care Coordination from 05/14/2023 in [place] POPULATION HEALTH DEPARTMENT  PHQ-2 Total Score 6 0 3 2 2   PHQ-9 Total Score 9 0 11 8 7    Flowsheet Row Admission (Discharged) from 12/31/2023 in [place] LONG PERIOPERATIVE AREA ED to Hosp-Admission (Discharged) from 11/21/2023 in [place] [facility] 6 EAST ONCOLOGY Admission (Discharged) from 10/30/2023 in [facility]  C-SSRS RISK CATEGORY No Risk No Risk No Risk     Assessment and Plan: ***  Collaboration of Care: Collaboration of Care: {BH OP Collaboration of Care:21014065}  Patient/Guardian was advised Release of Information must be obtained prior to any record release in order to collaborate their care with an outside provider. Patient/Guardian was advised if they have not already done so to contact the registration department to sign all necessary forms in order for us  to release information regarding their care.   Consent: Patient/Guardian gives verbal consent for treatment and assignment of benefits for services provided during this visit. Patient/Guardian expressed understanding and agreed to proceed.    Staci LOISE Kerns, NP 02/09/2024, 12:01 PM       [1] Allergies Allergen Reactions   Asa [Aspirin ] Other (See Comments)    Kidney disease   Methadose [Methadone Hcl] Other (See Comments)    Syncopal event   Corticosteroids Other (See Comments)    Hyperglycemia   Levaquin [Levofloxacin] Itching

## 2024-02-10 DIAGNOSIS — F1123 Opioid dependence with withdrawal: Secondary | ICD-10-CM | POA: Insufficient documentation

## 2024-02-10 DIAGNOSIS — L89313 Pressure ulcer of right buttock, stage 3: Secondary | ICD-10-CM | POA: Insufficient documentation

## 2024-02-11 ENCOUNTER — Encounter: Payer: Self-pay | Admitting: Orthopedic Surgery

## 2024-02-11 ENCOUNTER — Encounter: Payer: Self-pay | Admitting: Family Medicine

## 2024-02-11 NOTE — Pre-Procedure Instructions (Incomplete)
 Surgical Instructions   Your procedure is scheduled on Tuesday February 17, 2024. Report to [facility] Main Entrance A at 5:30 A.M., then check in with the Admitting office. Any questions or running late day of surgery: call [phone]  Questions prior to your surgery date: call [phone], Monday-Friday, 8am-4pm. If you experience any cold or flu symptoms such as cough, fever, chills, shortness of breath, etc. between now and your scheduled surgery, please notify us  at the above number.     Remember:  Do not eat after midnight the night before your surgery  You may drink clear liquids until 4:30 the morning of your surgery.   Clear liquids allowed are: Water , Non-Citrus Juices (without pulp), Carbonated Beverages, Clear Tea (no milk, honey, etc.), Black Coffee Only (NO MILK, CREAM OR POWDERED CREAMER of any kind), and Gatorade.  Patient Instructions  The night before surgery:  No food after midnight. ONLY clear liquids after midnight   The day of surgery (if you have diabetes): Drink ONE (1) 12 oz G2 given to you in your pre admission testing appointment by 4:30 the morning of surgery. Drink in one sitting. Do not sip.  This drink was given to you during your hospital  pre-op appointment visit.  Nothing else to drink after completing the  12 oz bottle of G2.         If you have questions, please contact your surgeons office.     Take these medicines the morning of surgery with A SIP OF WATER   acetaminophen  (TYLENOL )  atorvastatin  (LIPITOR)  baclofen  (LIORESAL   busPIRone  (BUSPAR   carvedilol  (COREG )  cloNIDine  (CATAPRES )  diltiazem  (DILT-XR)  fluticasone  (FLONASE )  gabapentin  (NEURONTIN )  hydrOXYzine  (ATARAX )  loratadine  (CLARITIN   methocarbamol  (ROBAXIN )  montelukast  (SINGULAIR )  omeprazole  (PRILOSEC)  OXcarbazepine  (TRILEPTAL )  TRELEGY ELLIPTA   venlafaxine  XR     Please check with prescribing physician for if/when to stop taking Suboxone.  One week prior  to surgery, STOP taking any Aspirin  (unless otherwise instructed by your surgeon) Aleve , Naproxen , Ibuprofen, Motrin, Advil, Goody's, BC's, all herbal medications, fish oil, and non-prescription vitamins.   WHAT DO I DO ABOUT MY DIABETES MEDICATION?   THE NIGHT BEFORE SURGERY/THE MORNING OF SURGERY, take 20 units of Lantus  insulin .  Stop taking Semaglutide  one week prior to surgery, do not take any doses after Monday February 09, 2024.  If your CBG is greater than 220 mg/dL, you may take  of your sliding scale of Insulin  Lispro (Humalog  Kwikpen) .   HOW TO MANAGE YOUR DIABETES BEFORE AND AFTER SURGERY  Why is it important to control my blood sugar before and after surgery? Improving blood sugar levels before and after surgery helps healing and can limit problems. A way of improving blood sugar control is eating a healthy diet by:  Eating less sugar and carbohydrates  Increasing activity/exercise  Talking with your doctor about reaching your blood sugar goals High blood sugars (greater than 180 mg/dL) can raise your risk of infections and slow your recovery, so you will need to focus on controlling your diabetes during the weeks before surgery. Make sure that the doctor who takes care of your diabetes knows about your planned surgery including the date and location.  How do I manage my blood sugar before surgery? Check your blood sugar at least 4 times a day, starting 2 days before surgery, to make sure that the level is not too high or low.  Check your blood sugar the morning of your surgery when you wake  up and every 2 hours until you get to the Short Stay unit.  If your blood sugar is less than 70 mg/dL, you will need to treat for low blood sugar: Do not take insulin . Treat a low blood sugar (less than 70 mg/dL) with  cup of clear juice (cranberry or apple), 4 glucose tablets, OR glucose gel. Recheck blood sugar in 15 minutes after treatment (to make sure it is greater than 70  mg/dL). If your blood sugar is not greater than 70 mg/dL on recheck, call [phone] for further instructions. Report your blood sugar to the short stay nurse when you get to Short Stay.  If you are admitted to the hospital after surgery: Your blood sugar will be checked by the staff and you will probably be given insulin  after surgery (instead of oral diabetes medicines) to make sure you have good blood sugar levels. The goal for blood sugar control after surgery is 80-180 mg/dL.                      Do NOT Smoke (Tobacco/Vaping) for 24 hours prior to your procedure.  If you use a CPAP at night, you may bring your mask/headgear for your overnight stay.   You will be asked to remove any contacts, glasses, piercing's, hearing aid's, dentures/partials prior to surgery. Please bring cases for these items if needed.    Your surgeon will determine if you are to be admitted or discharged the same day.  Patients discharged the day of surgery will not be allowed to drive home, and someone needs to stay with them for 24 hours.  SURGICAL WAITING ROOM VISITATION Patients may have no more than 2 support people in the waiting area - these visitors may rotate.   Pre-op nurse will coordinate an appropriate time for 2 ADULT support persons, who may not rotate, to accompany patient in pre-op.  Children under the age of 34 must have an adult with them who is not the patient and must remain in the main waiting area with an adult.  If the patient needs to stay at the hospital during part of their recovery, the visitor guidelines for inpatient rooms apply.  Please refer to the [facility] website for the visitor guidelines for any additional information.   If you received a COVID test during your pre-op visit  it is requested that you wear a mask when out in public, stay away from anyone that may not be feeling well and notify your surgeon if you develop symptoms. If you have been in contact with anyone that  has tested positive in the last 10 days please notify you surgeon.      Pre-operative 4 CHG Bathing Instructions   You can play a key role in reducing the risk of infection after surgery. Your skin needs to be as free of germs as possible. You can reduce the number of germs on your skin by washing with CHG (chlorhexidine  gluconate) soap before surgery. CHG is an antiseptic soap that kills germs and continues to kill germs even after washing.   DO NOT use if you have an allergy to chlorhexidine /CHG or antibacterial soaps. If your skin becomes reddened or irritated, stop using the CHG and notify one of our RNs at [phone].   Please shower with the CHG soap starting 4 days before surgery using the following schedule:     Please keep in mind the following:  DO NOT shave, including legs and underarms, starting  the day of your first shower.   You may shave your face at any point before/day of surgery.  Place clean sheets on your bed the day you start using CHG soap. Use a clean washcloth (not used since being washed) for each shower. DO NOT sleep with pets once you start using the CHG.   CHG Shower Instructions:  Wash your face and private area with normal soap. If you choose to wash your hair, wash first with your normal shampoo.  After you use shampoo/soap, rinse your hair and body thoroughly to remove shampoo/soap residue.  Turn the water  OFF and apply  bottle of CHG soap to a CLEAN washcloth.  Apply CHG soap ONLY FROM YOUR NECK DOWN TO YOUR TOES (washing for 3-5 minutes)  DO NOT use CHG soap on face, private areas, open wounds, or sores.  Pay special attention to the area where your surgery is being performed.  If you are having back surgery, having someone wash your back for you may be helpful. Wait 2 minutes after CHG soap is applied, then you may rinse off the CHG soap.  Pat dry with a clean towel  Put on clean clothes/pajamas   If you choose to wear lotion, please use ONLY the  CHG-compatible lotions that are listed below.  Additional instructions for the day of surgery:  If you choose, you may shower the morning of surgery with an antibacterial soap.  DO NOT APPLY any lotions, deodorants, cologne, or perfumes.   Do not bring valuables to the hospital. [facility] is not responsible for any belongings/valuables. Do not wear nail polish, gel polish, artificial nails, or any other type of covering on natural nails (fingers and toes) Do not wear jewelry or makeup Put on clean/comfortable clothes.  Please brush your teeth.  Ask your nurse before applying any prescription medications to the skin.     CHG Compatible Lotions   Aveeno Moisturizing lotion  Cetaphil Moisturizing Cream  Cetaphil Moisturizing Lotion  Clairol Herbal Essence Moisturizing Lotion, Dry Skin  Clairol Herbal Essence Moisturizing Lotion, Extra Dry Skin  Clairol Herbal Essence Moisturizing Lotion, Normal Skin  Curel Age Defying Therapeutic Moisturizing Lotion with Alpha Hydroxy  Curel Extreme Care Body Lotion  Curel Soothing Hands Moisturizing Hand Lotion  Curel Therapeutic Moisturizing Cream, Fragrance-Free  Curel Therapeutic Moisturizing Lotion, Fragrance-Free  Curel Therapeutic Moisturizing Lotion, Original Formula  Eucerin Daily Replenishing Lotion  Eucerin Dry Skin Therapy Plus Alpha Hydroxy Crme  Eucerin Dry Skin Therapy Plus Alpha Hydroxy Lotion  Eucerin Original Crme  Eucerin Original Lotion  Eucerin Plus Crme Eucerin Plus Lotion  Eucerin TriLipid Replenishing Lotion  Keri Anti-Bacterial Hand Lotion  Keri Deep Conditioning Original Lotion Dry Skin Formula Softly Scented  Keri Deep Conditioning Original Lotion, Fragrance Free Sensitive Skin Formula  Keri Lotion Fast Absorbing Fragrance Free Sensitive Skin Formula  Keri Lotion Fast Absorbing Softly Scented Dry Skin Formula  Keri Original Lotion  Keri Skin Renewal Lotion Keri Silky Smooth Lotion  Keri Silky Smooth Sensitive  Skin Lotion  Nivea Body Creamy Conditioning Oil  Nivea Body Extra Enriched Lotion  Nivea Body Original Lotion  Nivea Body Sheer Moisturizing Lotion Nivea Crme  Nivea Skin Firming Lotion  NutraDerm 30 Skin Lotion  NutraDerm Skin Lotion  NutraDerm Therapeutic Skin Cream  NutraDerm Therapeutic Skin Lotion  ProShield Protective Hand Cream  Provon moisturizing lotion  Please read over the following fact sheets that you were given.

## 2024-02-12 ENCOUNTER — Ambulatory Visit (INDEPENDENT_AMBULATORY_CARE_PROVIDER_SITE_OTHER): Admitting: Licensed Clinical Social Worker

## 2024-02-12 ENCOUNTER — Inpatient Hospital Stay (HOSPITAL_COMMUNITY)
Admission: RE | Admit: 2024-02-12 | Discharge: 2024-02-12 | Disposition: A | Discharge status: Home / Self Care | Source: Ambulatory Visit

## 2024-02-12 DIAGNOSIS — F330 Major depressive disorder, recurrent, mild: Secondary | ICD-10-CM | POA: Diagnosis not present

## 2024-02-12 DIAGNOSIS — F4310 Post-traumatic stress disorder, unspecified: Secondary | ICD-10-CM | POA: Diagnosis not present
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
## 2024-02-13 ENCOUNTER — Ambulatory Visit (HOSPITAL_BASED_OUTPATIENT_CLINIC_OR_DEPARTMENT_OTHER): Admitting: Cardiology

## 2024-02-16 ENCOUNTER — Other Ambulatory Visit: Payer: Self-pay | Admitting: Family Medicine

## 2024-02-16 ENCOUNTER — Encounter: Payer: Self-pay | Admitting: Family Medicine

## 2024-02-17 ENCOUNTER — Ambulatory Visit (HOSPITAL_COMMUNITY): Admit: 2024-02-17 | Admitting: Orthopedic Surgery

## 2024-02-17 DIAGNOSIS — Z01818 Encounter for other preprocedural examination: Secondary | ICD-10-CM

## 2024-02-17 SURGERY — POSTERIOR LUMBAR FUSION 2 LEVEL
Anesthesia: General

## 2024-02-19 ENCOUNTER — Ambulatory Visit (HOSPITAL_COMMUNITY): Admitting: Licensed Clinical Social Worker

## 2024-02-19 ENCOUNTER — Other Ambulatory Visit: Payer: Self-pay | Admitting: Podiatry

## 2024-02-19 DIAGNOSIS — F4310 Post-traumatic stress disorder, unspecified: Secondary | ICD-10-CM

## 2024-02-19 DIAGNOSIS — F330 Major depressive disorder, recurrent, mild: Secondary | ICD-10-CM

## 2024-02-19 NOTE — Progress Notes (Signed)
 Virtual Visit via Video Note   I connected with Bernarda CHANETA Fanti on 02/19/24 at 1:00pm by video enabled telemedicine application and verified that I am speaking with the correct person using two identifiers.   I discussed the limitations, risks, security and privacy concerns of performing an evaluation and management service by video and the availability of in person appointments. I also discussed with the patient that there may be a patient responsible charge related to this service. The patient expressed understanding and agreed to proceed.   I discussed the assessment and treatment plan with the patient. The patient was provided an opportunity to ask questions and all were answered. The patient agreed with the plan and demonstrated an understanding of the instructions.   The patient was advised to call back or seek an in-person evaluation if the symptoms worsen or if the condition fails to improve as anticipated.   I provided 34 minutes of non-face-to-face time during this encounter.   Darleene Ricker, LCSW, LCAS ____________________________ THERAPIST PROGRESS NOTE   Session Time: 1:00pm - 1:34pm     Location: Patient: Patient Home   Provider: [facility]    Participation Level: Active   Behavioral Response: Alert, casually dressed, depressed mood/affect    Type of Therapy:  Individual Therapy   Treatment Goals addressed: Mood management; Medication management  Progress Towards Goals: Progressing    Interventions: CBT: guided imagery     Summary: Tela Kotecki is a 59 year old divorced AA female that presented for therapy session today with diagnoses of Major depressive disorder, recurrent, mild with anxious distress; and PTSD.   Suicidal/Homicidal: None; without plan or intent.    Therapist Response: Clinician met with Lynita for virtual therapy session and assessed for safety, sobriety, and medication compliance.  Sharisse presented for today's appointment on time and was  alert, oriented x5, with no evidence or self-report of SI/HI or A/V H.  Indra reported ongoing compliance with medication and denied any use of alcohol or illicit drugs.  Clinician inquired about Donnell's current emotional ratings, as well as any significant changes in thoughts, feelings, or behavior since previous check-in. Charnelle reported scores of 10/10 for depression, 7/10 for anxiety, 0/10 for anger/irritability, and denied any reoccurrence of panic attacks or outbursts.  Marjani reported that she has been suffering from extreme back pain, which led her to change her appointment with clinician today to virtual, reschedule a surgery appointment and cancel attending her son's wedding over the weekend.  Alenah reported that she was experiencing pain at 10/10 in severity today.  Clinician invited Ashika to practice a new meditation exercise to add to self-care routine in order to better manage daily stress and muscle tension that might contribute to pain level.  Clinician provided instruction on process of getting comfortable, achieving relaxing breathing rhythm, and visualizing a protective light moving across the muscle groups within her body to identify and eliminate areas of tension associated with stress over course of 15 minutes practice.  Clinician inquired about how Shekelia mentally and physically felt afterward, any challenges she faced in concentration, and whether she would be motivated to practice this in free time to improve coping ability.  Intervention was effective, as evidenced by Bernarda actively engaging in exercise, and reporting she found this activity to be deeply relaxing, and reduced her pain level from 10/10 down to 7/10 afterward.  She reported that she also noticed that she was in a more positive mood, and felt like this could help calm  anger when triggered.  She stated It calmed me down and got my mind focused again.  Iza reported that this is the first time she has done a guided  imagery exercise, and she would be interested in trying more in the future.  Clinician will continue to monitor.      Plan: Follow up again in 1 week.   Diagnosis: Major depressive disorder, recurrent, mild with anxious distress; and PTSD.  Collaboration of Care:   No collaboration of care required for this visit.                                                   Patient/Guardian was advised Release of Information must be obtained prior to any record release in order to collaborate their care with an outside provider. Patient/Guardian was advised if they have not already done so to contact the registration department to sign all necessary forms in order for us  to release information regarding their care.    Consent: Patient/Guardian gives verbal consent for treatment and assignment of benefits for services provided during this visit. Patient/Guardian expressed understanding and agreed to proceed.  Darleene Ricker, LCSW, LCAS 02/19/24

## 2024-02-24 ENCOUNTER — Ambulatory Visit: Admitting: Orthopaedic Surgery

## 2024-02-26 ENCOUNTER — Ambulatory Visit (HOSPITAL_COMMUNITY): Payer: Self-pay | Admitting: Licensed Clinical Social Worker

## 2024-02-26 ENCOUNTER — Telehealth (HOSPITAL_COMMUNITY): Payer: Self-pay | Admitting: Licensed Clinical Social Worker

## 2024-02-26 ENCOUNTER — Encounter (HOSPITAL_COMMUNITY): Payer: Self-pay

## 2024-02-26 NOTE — Telephone Encounter (Signed)
 [name] had a virtual appointment scheduled today for 1pm.  Clinician outreached [name] by phone at 1:07pm when she had not arrived on time. [name] did not answer this phone call, so a voicemail was left reminding her of appointment, and included callback number for front desk staff if rescheduling assistance was needed.  Clinician ended virtual session and informed front desk staff of no-show event when [name] did not present by 1:15pm.    Darleene Ricker, LCSW, LCAS 02/26/24

## 2024-03-01 ENCOUNTER — Encounter: Admitting: Orthopedic Surgery

## 2024-03-02 ENCOUNTER — Ambulatory Visit: Payer: Self-pay | Admitting: Family Medicine

## 2024-03-04 ENCOUNTER — Other Ambulatory Visit: Payer: Self-pay

## 2024-03-04 ENCOUNTER — Ambulatory Visit (HOSPITAL_COMMUNITY): Admitting: Licensed Clinical Social Worker

## 2024-03-04 DIAGNOSIS — F4310 Post-traumatic stress disorder, unspecified: Secondary | ICD-10-CM

## 2024-03-04 DIAGNOSIS — F330 Major depressive disorder, recurrent, mild: Secondary | ICD-10-CM

## 2024-03-04 NOTE — Progress Notes (Signed)
 Virtual Visit via Video Note   I connected with [name] on 03/04/24 at 3:00pm by video enabled telemedicine application and verified that I am speaking with the correct person using two identifiers.   I discussed the limitations, risks, security and privacy concerns of performing an evaluation and management service by video and the availability of in person appointments. I also discussed with the patient that there may be a patient responsible charge related to this service. The patient expressed understanding and agreed to proceed.   I discussed the assessment and treatment plan with the patient. The patient was provided an opportunity to ask questions and all were answered. The patient agreed with the plan and demonstrated an understanding of the instructions.   The patient was advised to call back or seek an in-person evaluation if the symptoms worsen or if the condition fails to improve as anticipated.   I provided 32 minutes of non-face-to-face time during this encounter.   Darleene Ricker, LCSW, LCAS ____________________________ THERAPIST PROGRESS NOTE   Session Time: 3:00pm - 3:32pm      Location: Patient: Patient Home   Provider: [facility]    Participation Level: Active   Behavioral Response: Alert, casually dressed, depressed mood/affect    Type of Therapy:  Individual Therapy   Treatment Goals addressed: Mood management; Medication management; Improving sleep hygiene    Progress Towards Goals: Progressing    Interventions: CBT, sleep hygiene techniques    Summary: [name] is a 59 year old divorced AA female that presented for therapy session today with diagnoses of Major depressive disorder, recurrent, mild with anxious distress; and PTSD.   Suicidal/Homicidal: None; without plan or intent.    Therapist Response: Clinician met with [name] for virtual therapy appointment and assessed for safety, sobriety, and medication compliance.  [name] presented for  today's session on time and was alert, oriented x5, with no evidence or self-report of SI/HI or A/V H.  [name] reported ongoing compliance with medication and denied any use of alcohol or illicit drugs.  Clinician inquired about [name]'s emotional ratings today, as well as any significant changes in thoughts, feelings, or behavior since last check-in. [name] reported scores of 10/10 for depression, 0/10 for anxiety, 0/10 for anger/irritability, and denied any reoccurrence of panic attacks or outbursts.  [name] reported that she missed out last appointment because she overslept.  She reported that she has been very depressed and sleeping as long as 20 hours some days, stating I sleep really hard.  Clinician discussed topic of sleep hygiene today with [name].  Clinician defined this as the habits, behaviors and environmental factors that can be adjusted to improve overall sleep quality.  Clinician also discussed how lack of consistent sleep can negatively affect mood and overall mental health.  Clinician offered techniques to [name] via handout which could be implemented in order to improve sleep hygiene, such as sticking to a normal morning/night routine, avoiding using of electronics too close to bedtime, avoiding heavy meals before bed, using a sleep journal to track changes and address anxious thoughts, as well as avoiding naps during the daytime, ensuring proper use of medications if prescribed any by provider(s), and more.  Clinician inquired about changes [name] intends to make to sleep hygiene based upon information covered today.  Interventions were effective, as evidenced by Mary actively participating in discussion on subject, reporting that she used to have a consistent routine of going to bed by 12am each night, and waking up at 7am  in the morning, and avoiding naps during the daytime.  [name] reported that she believes her chronic pain makes it difficult for her to get out of bed, and her kidney  disease also leads to fatigue and daytime sleepiness.  [name] reported that she is smoking roughly 1 pack of cigarettes each day, but this has no impact on her energy level.  She reported that she has a history of sleep apnea that resolved over time, but she will plan to speak with her PCP at next appointment in following week to have tests run.  [name] asked to end session early due to fatigue.  Clinician will continue to monitor.      Plan: Follow up again in 1 week.   Diagnosis: Major depressive disorder, recurrent, mild with anxious distress; and PTSD.  Collaboration of Care:   No collaboration of care required for this visit.                                                   Patient/Guardian was advised Release of Information must be obtained prior to any record release in order to collaborate their care with an outside provider. Patient/Guardian was advised if they have not already done so to contact the registration department to sign all necessary forms in order for us  to release information regarding their care.    Consent: Patient/Guardian gives verbal consent for treatment and assignment of benefits for services provided during this visit. Patient/Guardian expressed understanding and agreed to proceed.   Darleene Ricker, LCSW, LCAS 03/04/24

## 2024-03-09 ENCOUNTER — Other Ambulatory Visit

## 2024-03-09 ENCOUNTER — Ambulatory Visit: Admitting: Orthopaedic Surgery

## 2024-03-11 ENCOUNTER — Ambulatory Visit (HOSPITAL_COMMUNITY): Admitting: Licensed Clinical Social Worker

## 2024-03-15 ENCOUNTER — Ambulatory Visit: Payer: Self-pay | Admitting: Family Medicine

## 2024-03-18 ENCOUNTER — Ambulatory Visit (HOSPITAL_COMMUNITY): Admitting: Licensed Clinical Social Worker

## 2024-03-22 ENCOUNTER — Ambulatory Visit

## 2024-03-25 ENCOUNTER — Ambulatory Visit (HOSPITAL_COMMUNITY): Admitting: Licensed Clinical Social Worker

## 2024-04-05 ENCOUNTER — Telehealth (HOSPITAL_COMMUNITY): Admitting: Family

## 2024-04-29 ENCOUNTER — Encounter

## 2024-05-10 ENCOUNTER — Ambulatory Visit (HOSPITAL_BASED_OUTPATIENT_CLINIC_OR_DEPARTMENT_OTHER): Admitting: Cardiology
